# Patient Record
Sex: Female | Born: 1965 | Race: White | Hispanic: No | Smoking: Light tobacco smoker
Health system: Southern US, Community
[De-identification: ages and names within clinical notes are randomized; demographics above are authoritative.]

## PROBLEM LIST (undated history)

## (undated) DIAGNOSIS — E1065 Type 1 diabetes mellitus with hyperglycemia: Secondary | ICD-10-CM

## (undated) DIAGNOSIS — B3731 Acute candidiasis of vulva and vagina: Secondary | ICD-10-CM

## (undated) DIAGNOSIS — E1140 Type 2 diabetes mellitus with diabetic neuropathy, unspecified: Secondary | ICD-10-CM

## (undated) DIAGNOSIS — I639 Cerebral infarction, unspecified: Secondary | ICD-10-CM

## (undated) DIAGNOSIS — R350 Frequency of micturition: Secondary | ICD-10-CM

## (undated) DIAGNOSIS — R112 Nausea with vomiting, unspecified: Secondary | ICD-10-CM

## (undated) DIAGNOSIS — I10 Essential (primary) hypertension: Secondary | ICD-10-CM

## (undated) DIAGNOSIS — F419 Anxiety disorder, unspecified: Secondary | ICD-10-CM

## (undated) DIAGNOSIS — N189 Chronic kidney disease, unspecified: Secondary | ICD-10-CM

## (undated) DIAGNOSIS — B373 Candidiasis of vulva and vagina: Secondary | ICD-10-CM

## (undated) DIAGNOSIS — K219 Gastro-esophageal reflux disease without esophagitis: Secondary | ICD-10-CM

## (undated) DIAGNOSIS — F329 Major depressive disorder, single episode, unspecified: Secondary | ICD-10-CM

## (undated) DIAGNOSIS — IMO0002 Reserved for concepts with insufficient information to code with codable children: Secondary | ICD-10-CM

## (undated) DIAGNOSIS — R0789 Other chest pain: Secondary | ICD-10-CM

## (undated) DIAGNOSIS — R519 Headache, unspecified: Secondary | ICD-10-CM

## (undated) HISTORY — DX: Other chest pain: R07.89

## (undated) HISTORY — DX: Candidiasis of vulva and vagina: B37.3

## (undated) HISTORY — DX: Acute candidiasis of vulva and vagina: B37.31

## (undated) HISTORY — DX: Type 1 diabetes mellitus with hyperglycemia: E10.65

## (undated) HISTORY — DX: Cerebral infarction, unspecified: I63.9

## (undated) HISTORY — PX: ENDOMETRIAL ABLATION: SHX621

## (undated) HISTORY — DX: Anxiety disorder, unspecified: F41.9

## (undated) HISTORY — DX: Frequency of micturition: R35.0

## (undated) HISTORY — DX: Reserved for concepts with insufficient information to code with codable children: IMO0002

## (undated) HISTORY — DX: Nausea with vomiting, unspecified: R11.2

## (undated) HISTORY — DX: Major depressive disorder, single episode, unspecified: F32.9

## (undated) HISTORY — DX: Essential (primary) hypertension: I10

## (undated) HISTORY — DX: Type 2 diabetes mellitus with diabetic neuropathy, unspecified: E11.40

## (undated) NOTE — *Deleted (*Deleted)
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  Orthopedic surgery has consulted and she is status post operative repair 10/28.    Will her work-up was significant for worsening creatinine, followed by renal with consideration to initiate hemodialysis this admission, he does have leukocytosis, with concern about infection at left foot transmetatarsal amputation site.  Subjective:  He denies any complaints today.  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 Lyla Glassing on 10/28 -PT recommended CIR, patient agreeable, insurance authorization was unfortunately denied and now social work is attempting to look for SNF PT continues recommend rehab and bed offers have been given to the patient but they are awaiting a PASARR  Acute on Chronic Anemia of Chronic Kidney Disease, postoperative acute blood loss anemia -Transfuse  as needed, so far received 3 units during hospital stay. -On IV iron per renal.  Leukocytosis, worsening with concern for infection/calcaneal osteomyelitis -MRI of left ankle  is pending, management per ID, she is currently on IV cefazolin, possible need for debridement versus prolonged antibiotic course. -She is being followed by podiatry, vascular and ID.  Hyponatremia -Management per renal  Acute Kidney Injury on Chronic Kidney Disease Stage V  -Management per renal, follow on labs this morning, likely will need to initiate hemodialysis per renal this admission. -Plan for [facility] and permanent access tomorrow by Dr. Donzetta Matters.  Hypomagnesemia -Pleat as needed  Volume overload with bilateral lower extremity edema, improved  -improving with 2 doses of Lasix.  Swelling improved, hold further diuresis and now started back on IV fluids with sodium bicarbonate at 75 MLS per hour given her renal function; nephrology recommending discontinuing IV fluids now  Type 2 Diabetes Mellitus -Medication has been adjusted, increase Lantus to 26 units every afternoon, continue with NovoLog at 5 units before meals.  Continue with insulin sliding scale.    -CBG (last 3)  Recent Labs    01/09/20 0825 01/09/20 1225 01/09/20 1252  GLUCAP 231* 173* 169*    GERD -She has a PPI but was taking her home Dexilant which has now been sent to the pharmacy -Resume home [place] and stop her PPI here.  Essential Hypertension -required up titration of her antihypertensive regimen for couple days neuro, finally blood pressure better -Continue with carvedilol 25 mg p.o. twice daily, amlodipine 10 mg p.o. daily -Continue to monitor blood pressures per protocol -Last blood pressure was [id number]  Obesity -Complicates overall prognosis and care -Estimated body mass index is 30.27 kg/m as calculated from the following:   Height as of this encounter: 5\' 10"  (1.778 m).   Weight as  of this encounter: 95.7 kg. -Weight Loss and Dietary Counseling given   DVT prophylaxis: Heparin Sq 5000 units q8h Code Status: FULL CODE Family Communication: No family present at bedside  Disposition Plan: Will  need SNF placement once medically stable.  Status is: Inpatient  Remains inpatient appropriate because:Unsafe d/c plan, IV treatments appropriate due to intensity of illness or inability to take PO and Inpatient level of care appropriate due to severity of illness   Dispo: The patient is from: Home              Anticipated d/c is to: SNF              Anticipated d/c date is: > 3 days              Patient currently is not medically stable to d/c.  Consultants:   Orthopedic Surgery  Nephrology  Podiatry   Infectious Diseases   Vascular surgery  Procedures: Intramedullary fixation, Left femur by Dr. Lyla Glassing  Antimicrobials:  Anti-infectives (From admission, onward)   Start     Dose/Rate Route Frequency Ordered Stop   01/06/20 0400  ceFAZolin (ANCEF) IVPB 2g/100 mL premix        2 g 200 mL/hr over 30 Minutes Intravenous Every 12 hours 01/05/20 1709     01/05/20 2200  ceFAZolin (ANCEF) IVPB 2g/100 mL premix  Status:  Discontinued        2 g 200 mL/hr over 30 Minutes Intravenous Every 8 hours 01/05/20 1707 01/05/20 1709   01/04/20 1700  ceFAZolin (ANCEF) IVPB 1 g/50 mL premix  Status:  Discontinued        1 g 100 mL/hr over 30 Minutes Intravenous Every 12 hours 01/04/20 1611 01/05/20 1707   12/29/19 1930  ceFAZolin (ANCEF) IVPB 2g/100 mL premix        2 g 200 mL/hr over 30 Minutes Intravenous Every 6 hours 12/29/19 1620 12/30/19 0201   12/29/19 1300  ceFAZolin (ANCEF) IVPB 2g/100 mL premix        2 g 200 mL/hr over 30 Minutes Intravenous On call to O.R. 12/29/19 0113 12/29/19 1325        Objective: Vitals:   01/09/20 1200 01/09/20 1215 01/09/20 1230 01/09/20 1256  BP: (!) 132/52 (!) 135/55 (!) 133/57 (!) 137/52  Pulse: (!) 54 (!) 55 (!) 57 (!) 59  Resp: (!) 23 19 12    Temp:   (!) 97.3 F (36.3 C) 98 F (36.7 C)  TempSrc:    Oral  SpO2: 92% 94% 94% 96%  Weight:      Height:        Intake/Output Summary (Last 24 hours) at 01/09/2020 1432 Last data filed at  01/09/2020 1301 Gross per 24 hour  Intake 440 ml  Output 1035 ml  Net -595 ml   Filed Weights   12/29/19 1147 01/05/20 1124 01/06/20 0430  Weight: 88.6 kg 95 kg 95.7 kg   Examination: Physical Exam:  Awake Alert, Oriented X 3, No new F.N deficits, Normal affect Symmetrical Chest wall movement, Good air movement bilaterally, CTAB RRR,No Gallops,Rubs or new Murmurs, No Parasternal Heave +ve B.Sounds, Abd Soft, No tenderness, No rebound - guarding or rigidity. No Cyanosis, Clubbing, in the right lower extremity, left lower extremity bandaged.  Data Reviewed: I have personally reviewed following labs and imaging studies  CBC: Recent Labs  Lab 01/05/20 0523 01/06/20 0522 01/07/20 0107 01/08/20 1237 01/09/20 0111  WBC 34.7* 21.2* 15.6* 18.9* 18.0*  NEUTROABS 30.5* 17.9* 12.5*  --   --  HGB 7.6* 7.1* 7.3* 8.6* 8.2*  HCT 23.8* 22.6* 22.5* 25.9* 25.8*  MCV 88.5 89.0 86.9 85.8 85.7  PLT 241 210 231 266 0000000   Basic Metabolic Panel: Recent Labs  Lab 01/05/20 0523 01/06/20 0522 01/07/20 0107 01/08/20 1237 01/09/20 0111  NA 125* 126* 127* 126* 127*  K 4.7 3.9 4.2 4.3 4.2  CL 96* 93* 92* 92* 92*  CO2 16* 20* 22 19* 21*  GLUCOSE 352* 248* 193* 223* 216*  BUN 119* 123* 134* 143* 148*  CREATININE 6.76* 6.64* 7.33* 7.55* 7.67*  CALCIUM 8.0* 7.4* 7.7* 8.2* 8.3*  MG 1.5* 1.5* 1.6*  --   --   PHOS 6.0* 5.5* 5.8* 6.4* 6.7*   GFR: Estimated Creatinine Clearance: 10.8 mL/min (A) (by C-G formula based on SCr of 7.67 mg/dL (H)). Liver Function Tests: Recent Labs  Lab 01/05/20 0523 01/06/20 0522 01/07/20 0107 01/08/20 1237 01/09/20 0111  AST 22 31 21   --   --   ALT 12 12 6   --   --   ALKPHOS 216* 223* 223*  --   --   BILITOT 1.0 0.9 0.7  --   --   PROT 6.2* 5.8* 5.8*  --   --   ALBUMIN 2.1* 1.9* 1.7* 1.7* 1.7*   No results for input(s): LIPASE, AMYLASE in the last 168 hours. No results for input(s): AMMONIA in the last 168 hours. Coagulation Profile: No results for  input(s): INR, PROTIME in the last 168 hours. Cardiac Enzymes: No results for input(s): CKTOTAL, CKMB, CKMBINDEX, TROPONINI in the last 168 hours. BNP (last 3 results) No results for input(s): PROBNP in the last 8760 hours. HbA1C: No results for input(s): HGBA1C in the last 72 hours. CBG: Recent Labs  Lab 01/08/20 2021 01/09/20 0802 01/09/20 0825 01/09/20 1225 01/09/20 1252  GLUCAP 224* 218* 231* 173* 169*   Lipid Profile: No results for input(s): CHOL, HDL, LDLCALC, TRIG, CHOLHDL, LDLDIRECT in the last 72 hours. Thyroid Function Tests: No results for input(s): TSH, T4TOTAL, FREET4, T3FREE, THYROIDAB in the last 72 hours. Anemia Panel: No results for input(s): VITAMINB12, FOLATE, FERRITIN, TIBC, IRON, RETICCTPCT in the last 72 hours. Sepsis Labs: No results for input(s): PROCALCITON, LATICACIDVEN in the last 168 hours.  Recent Results (from the past 240 hour(s))  Culture, blood (routine x 2)     Status: None   Collection Time: 01/04/20  5:43 PM   Specimen: BLOOD LEFT HAND  Result Value Ref Range Status   Specimen Description   Final    BLOOD LEFT HAND Performed at [facility] [address], [place], [place] [address]    Special Requests   Final    BOTTLES DRAWN AEROBIC AND ANAEROBIC Blood Culture adequate volume Performed at [facility] [address], [place], [place] [address]    Culture   Final    NO GROWTH 5 DAYS Performed at [facility], [place] [address] [place], [place] [address]    Report Status 01/09/2020 FINAL  Final  Culture, blood (routine x 2)     Status: None   Collection Time: 01/04/20  5:51 PM   Specimen: BLOOD  Result Value Ref Range Status   Specimen Description   Final    BLOOD RIGHT ANTECUBITAL Performed at [facility] [address] [place], [place] [address]    Special Requests   Final    BOTTLES DRAWN AEROBIC ONLY Blood Culture results may not be optimal due to an  inadequate volume of blood received in culture bottles Performed at [facility] [address], [place], [place] [address]    Culture   Final    NO GROWTH 5 DAYS Performed at [facility], [place] [address] [place], [place] [address]    Report Status 01/09/2020 FINAL  Final     RN Pressure Injury Documentation:     Estimated body mass index is 30.27 kg/m as calculated from the following:   Height as of this encounter: 5\' 10"  (1.778 m).   Weight as of this encounter: 95.7 kg.  Malnutrition Type:  Nutrition Problem: Increased nutrient needs Etiology: post-op healing   Malnutrition Characteristics:  Signs/Symptoms: estimated needs   Nutrition Interventions:  Interventions: Other (Comment) (rena-vite, prosource plus)   Radiology Studies: DG Chest 1 View  Result Date: 01/09/2020 CLINICAL DATA:  Central line placement EXAM: CHEST  1 VIEW COMPARISON:  12/28/2019 FINDINGS: Interval placement of right internal jugular approach dual lumen catheter with distal tip terminating at the level of the superior cavoatrial junction. Heart size is mildly enlarged. No focal airspace consolidation. No pleural effusion or pneumothorax. IMPRESSION: Interval placement of right internal jugular approach dual lumen catheter. No pneumothorax. Electronically Signed   By: Davina Poke D.O.   On: 01/09/2020 13:40   MR ANKLE LEFT WO CONTRAST  Result Date: 01/09/2020 CLINICAL DATA:  History of prior amputation with pain, swelling and open wound along the plantar aspect of the stump. EXAM: MRI OF THE LEFT ANKLE WITHOUT CONTRAST TECHNIQUE: Multiplanar, multisequence MR imaging of the ankle was performed. No intravenous contrast was administered. COMPARISON:  Radiographs 01/03/2020 FINDINGS: There is a large open wound on the plantar aspect of the hindfoot with an associated underlying complex soft tissue abscess measuring approximately 3.3 x 2.9 cm. This extends all the way down to  the calcaneus. There is associated extensive osteomyelitis involving the calcaneus with numerous small bone abscesses (Brodie abscesses). I do not see any definite findings for septic arthritis involving the subtalar joints and the talus appears intact. The tibiotalar joint is also maintained and the distal tibia and fibula appear normal. IMPRESSION: 1. Large open wound on the plantar aspect of the hindfoot with an associated complex soft tissue abscess measuring approximately 3.3 x 2.9 cm. 2. Extensive osteomyelitis involving the calcaneus with numerous small bone abscesses (Brodie abscesses). 3. No definite findings for septic arthritis involving the subtalar joints. Electronically Signed   By: Marijo Sanes M.D.   On: 01/09/2020 07:40   DG Fluoro Guide CV Line-No Report  Result Date: 01/09/2020 Fluoroscopy was utilized by the requesting physician.  No radiographic interpretation.   Scheduled Meds: . (feeding supplement) PROSource Plus  30 mL Oral TID BM  . sodium chloride   Intravenous Once  . acetaminophen  650 mg Oral Q6H  . amLODipine  10 mg Oral Daily  . atorvastatin  40 mg Oral q1800  . carvedilol  25 mg Oral BID WC  . Chlorhexidine Gluconate Cloth  6 each Topical Q0600  . collagenase   Topical Daily  . dexlansoprazole  60 mg Oral Daily  . docusate sodium  100 mg Oral BID  . DULoxetine  60 mg Oral Daily  . heparin injection (subcutaneous)  5,000 Units Subcutaneous Q8H  . hydrALAZINE  100 mg Oral TID  . insulin aspart  0-5 Units Subcutaneous QHS  . insulin aspart  0-9 Units Subcutaneous TID WC  . insulin aspart  5 Units Subcutaneous TID WC  . insulin  glargine  26 Units Subcutaneous QHS  . kidney failure book   Does not apply Once  . multivitamin  1 tablet Oral QHS  . sodium bicarbonate  1,300 mg Oral BID   Continuous Infusions: . sodium chloride 250 mL (01/08/20 1516)  . sodium chloride 10 mL/hr at 01/09/20 0953  .  ceFAZolin (ANCEF) IV 2 g (01/09/20 0413)    LOS: 12 days    Phillips Climes, MD [facility] PAGER is on [place]  If 7PM-7AM, please contact night-coverage [email or web address]

---

## 1999-03-04 DIAGNOSIS — F32A Depression, unspecified: Secondary | ICD-10-CM

## 1999-03-04 HISTORY — DX: Depression, unspecified: F32.A

## 1999-04-11 ENCOUNTER — Encounter: Admission: RE | Admit: 1999-04-11 | Discharge: 1999-07-10 | Payer: Self-pay | Admitting: Endocrinology

## 1999-07-01 ENCOUNTER — Ambulatory Visit (HOSPITAL_COMMUNITY): Admission: RE | Admit: 1999-07-01 | Discharge: 1999-07-01 | Payer: Self-pay | Admitting: Obstetrics and Gynecology

## 1999-07-01 ENCOUNTER — Encounter: Payer: Self-pay | Admitting: Obstetrics and Gynecology

## 1999-07-29 ENCOUNTER — Ambulatory Visit (HOSPITAL_COMMUNITY): Admission: RE | Admit: 1999-07-29 | Discharge: 1999-07-29 | Payer: Self-pay | Admitting: Obstetrics and Gynecology

## 1999-07-29 ENCOUNTER — Encounter: Payer: Self-pay | Admitting: Obstetrics and Gynecology

## 1999-08-20 ENCOUNTER — Encounter: Payer: Self-pay | Admitting: Obstetrics and Gynecology

## 1999-08-20 ENCOUNTER — Ambulatory Visit (HOSPITAL_COMMUNITY): Admission: RE | Admit: 1999-08-20 | Discharge: 1999-08-20 | Payer: Self-pay | Admitting: Obstetrics and Gynecology

## 1999-11-26 ENCOUNTER — Encounter (HOSPITAL_COMMUNITY): Admission: RE | Admit: 1999-11-26 | Discharge: 1999-12-13 | Payer: Self-pay | Admitting: Obstetrics and Gynecology

## 1999-12-12 ENCOUNTER — Inpatient Hospital Stay (HOSPITAL_COMMUNITY): Admission: AD | Admit: 1999-12-12 | Discharge: 1999-12-15 | Payer: Self-pay | Admitting: Obstetrics and Gynecology

## 2000-03-03 DIAGNOSIS — F419 Anxiety disorder, unspecified: Secondary | ICD-10-CM

## 2000-03-03 HISTORY — DX: Anxiety disorder, unspecified: F41.9

## 2000-05-01 ENCOUNTER — Other Ambulatory Visit: Admission: RE | Admit: 2000-05-01 | Discharge: 2000-05-01 | Payer: Self-pay | Admitting: Obstetrics and Gynecology

## 2001-03-18 ENCOUNTER — Encounter: Payer: Self-pay | Admitting: Obstetrics and Gynecology

## 2001-03-18 ENCOUNTER — Ambulatory Visit (HOSPITAL_COMMUNITY): Admission: RE | Admit: 2001-03-18 | Discharge: 2001-03-18 | Payer: Self-pay | Admitting: Obstetrics and Gynecology

## 2001-10-12 ENCOUNTER — Other Ambulatory Visit: Admission: RE | Admit: 2001-10-12 | Discharge: 2001-10-12 | Payer: Self-pay | Admitting: Obstetrics and Gynecology

## 2002-12-26 ENCOUNTER — Other Ambulatory Visit: Admission: RE | Admit: 2002-12-26 | Discharge: 2002-12-26 | Payer: Self-pay | Admitting: Obstetrics and Gynecology

## 2003-04-18 ENCOUNTER — Encounter: Admission: RE | Admit: 2003-04-18 | Discharge: 2003-04-18 | Payer: Self-pay | Admitting: Family Medicine

## 2003-10-25 ENCOUNTER — Inpatient Hospital Stay (HOSPITAL_COMMUNITY): Admission: EM | Admit: 2003-10-25 | Discharge: 2003-10-26 | Payer: Self-pay | Admitting: Emergency Medicine

## 2004-01-16 ENCOUNTER — Inpatient Hospital Stay (HOSPITAL_COMMUNITY): Admission: RE | Admit: 2004-01-16 | Discharge: 2004-01-19 | Payer: Self-pay | Admitting: Surgery

## 2004-04-15 ENCOUNTER — Ambulatory Visit: Payer: Self-pay | Admitting: Family Medicine

## 2004-04-22 ENCOUNTER — Encounter: Admission: RE | Admit: 2004-04-22 | Discharge: 2004-07-21 | Payer: Self-pay | Admitting: Family Medicine

## 2004-07-15 ENCOUNTER — Ambulatory Visit: Payer: Self-pay | Admitting: Family Medicine

## 2004-10-09 ENCOUNTER — Ambulatory Visit: Payer: Self-pay | Admitting: Family Medicine

## 2004-12-20 ENCOUNTER — Ambulatory Visit: Payer: Self-pay | Admitting: Sports Medicine

## 2004-12-25 ENCOUNTER — Encounter: Admission: RE | Admit: 2004-12-25 | Discharge: 2004-12-25 | Payer: Self-pay | Admitting: Sports Medicine

## 2005-01-10 ENCOUNTER — Ambulatory Visit: Payer: Self-pay | Admitting: Family Medicine

## 2005-05-01 ENCOUNTER — Other Ambulatory Visit: Admission: RE | Admit: 2005-05-01 | Discharge: 2005-05-01 | Payer: Self-pay | Admitting: Obstetrics and Gynecology

## 2005-05-01 ENCOUNTER — Encounter (INDEPENDENT_AMBULATORY_CARE_PROVIDER_SITE_OTHER): Payer: Self-pay | Admitting: *Deleted

## 2005-05-01 LAB — CONVERTED CEMR LAB

## 2005-05-07 ENCOUNTER — Ambulatory Visit (HOSPITAL_COMMUNITY): Admission: RE | Admit: 2005-05-07 | Discharge: 2005-05-07 | Payer: Self-pay | Admitting: Obstetrics and Gynecology

## 2005-12-03 ENCOUNTER — Ambulatory Visit (HOSPITAL_BASED_OUTPATIENT_CLINIC_OR_DEPARTMENT_OTHER): Admission: RE | Admit: 2005-12-03 | Discharge: 2005-12-03 | Payer: Self-pay | Admitting: *Deleted

## 2006-02-27 ENCOUNTER — Ambulatory Visit: Payer: Self-pay | Admitting: Family Medicine

## 2006-04-30 DIAGNOSIS — F17200 Nicotine dependence, unspecified, uncomplicated: Secondary | ICD-10-CM | POA: Insufficient documentation

## 2006-04-30 DIAGNOSIS — M654 Radial styloid tenosynovitis [de Quervain]: Secondary | ICD-10-CM | POA: Insufficient documentation

## 2006-04-30 DIAGNOSIS — K589 Irritable bowel syndrome without diarrhea: Secondary | ICD-10-CM | POA: Insufficient documentation

## 2006-04-30 DIAGNOSIS — K219 Gastro-esophageal reflux disease without esophagitis: Secondary | ICD-10-CM | POA: Insufficient documentation

## 2006-04-30 DIAGNOSIS — M25549 Pain in joints of unspecified hand: Secondary | ICD-10-CM | POA: Insufficient documentation

## 2006-04-30 DIAGNOSIS — IMO0001 Reserved for inherently not codable concepts without codable children: Secondary | ICD-10-CM | POA: Insufficient documentation

## 2006-04-30 DIAGNOSIS — E119 Type 2 diabetes mellitus without complications: Secondary | ICD-10-CM | POA: Insufficient documentation

## 2006-05-01 ENCOUNTER — Encounter (INDEPENDENT_AMBULATORY_CARE_PROVIDER_SITE_OTHER): Payer: Self-pay | Admitting: *Deleted

## 2007-01-18 ENCOUNTER — Ambulatory Visit: Payer: Self-pay | Admitting: Family Medicine

## 2007-11-01 ENCOUNTER — Encounter: Payer: Self-pay | Admitting: Family Medicine

## 2007-11-01 ENCOUNTER — Ambulatory Visit: Payer: Self-pay | Admitting: Family Medicine

## 2007-11-01 DIAGNOSIS — E669 Obesity, unspecified: Secondary | ICD-10-CM | POA: Insufficient documentation

## 2007-11-01 DIAGNOSIS — I10 Essential (primary) hypertension: Secondary | ICD-10-CM | POA: Insufficient documentation

## 2007-11-01 DIAGNOSIS — N393 Stress incontinence (female) (male): Secondary | ICD-10-CM | POA: Insufficient documentation

## 2007-11-02 ENCOUNTER — Encounter: Payer: Self-pay | Admitting: Family Medicine

## 2007-12-07 ENCOUNTER — Ambulatory Visit: Payer: Self-pay | Admitting: Family Medicine

## 2007-12-07 LAB — CONVERTED CEMR LAB: Hgb A1c MFr Bld: 9.2 %

## 2008-01-07 ENCOUNTER — Telehealth: Payer: Self-pay | Admitting: *Deleted

## 2008-01-24 ENCOUNTER — Ambulatory Visit: Payer: Self-pay | Admitting: Family Medicine

## 2008-01-26 ENCOUNTER — Ambulatory Visit: Payer: Self-pay | Admitting: Family Medicine

## 2008-02-10 ENCOUNTER — Telehealth: Payer: Self-pay | Admitting: *Deleted

## 2008-02-11 ENCOUNTER — Encounter: Payer: Self-pay | Admitting: Family Medicine

## 2008-02-11 ENCOUNTER — Ambulatory Visit: Payer: Self-pay | Admitting: Family Medicine

## 2008-02-11 LAB — CONVERTED CEMR LAB: Varicella IgG: 1.89 — ABNORMAL HIGH

## 2008-02-13 ENCOUNTER — Encounter: Payer: Self-pay | Admitting: Family Medicine

## 2008-02-15 ENCOUNTER — Telehealth (INDEPENDENT_AMBULATORY_CARE_PROVIDER_SITE_OTHER): Payer: Self-pay | Admitting: *Deleted

## 2008-03-13 ENCOUNTER — Ambulatory Visit: Payer: Self-pay | Admitting: Family Medicine

## 2008-06-07 ENCOUNTER — Encounter: Payer: Self-pay | Admitting: Family Medicine

## 2008-07-02 ENCOUNTER — Encounter: Payer: Self-pay | Admitting: Family Medicine

## 2008-08-04 ENCOUNTER — Telehealth: Payer: Self-pay | Admitting: Family Medicine

## 2008-11-07 ENCOUNTER — Telehealth: Payer: Self-pay | Admitting: *Deleted

## 2010-03-25 ENCOUNTER — Encounter: Payer: Self-pay | Admitting: *Deleted

## 2010-04-04 NOTE — Assessment & Plan Note (Signed)
Summary: cold like symptoms wp   Vital Signs:  Patient Profile:   45 Years Old Female Weight:      203 pounds Temp:     97.8 degrees F oral Pulse rate:   80 / minute BP sitting:   126 / 76  (right arm)  Pt. in pain?   yes    Location:   ear    Intensity:   6  Vitals Entered By: Mauricia Area CMA, (January 18, 2007 1:39 PM)              Is Patient Diabetic? No     Chief Complaint:  cough and congestion x 2 weeks. ear pain x 2 days.  History of Present Illness: Cough and congestion for 2 weeks. + smoker, taking OTC medication.  Denies fever.  Took Z pack one week ago prescribed by her OB GYN at a routine visit continuing to get worse.  Continuing to smoke cigarettes.      Risk Factors:  Tobacco use:  current    Counseled to quit/cut down tobacco use:  yes   Review of Systems  General      Denies fever.  ENT      Complains of nasal congestion, postnasal drainage, and sinus pressure.  Resp      Complains of cough, sputum productive, and wheezing.   Physical Exam  General:     Well-developed,well-nourished,in no acute distress; alert,appropriate and cooperative throughout examination Ears:     TM retracted Mouth:     + post nasal drainage Lungs:     normal respiratory effort, no crackles, and no wheezes.   Heart:     Normal rate and regular rhythm. S1 and S2 normal without gallop, murmur, click, rub or other extra sounds.    Impression & Recommendations:  Problem # 1:  BRONCHITIS, ACUTE (ICD-466.0) Doxycycline 100 mg two times a day for 10 day, Tussionex 1 teasp q 12 hr #100 cc no refills, sample of nasal steroid.  Stop smoking. Orders: [place] [facility]- Est Level  2 [id number])    Patient Instructions: 1)  Tobacco is very bad for your health and your loved ones! You Should stop smoking!. 2)  Stop Smoking Tips: Choose a Quit date. Cut down before the Quit date. decide what you will do as a substitute when you feel the urge to  smoke(gum,toothpick,exercise).    ]
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
## 2010-04-04 NOTE — Letter (Signed)
Summary: *Referral Letter  [facility]  [address]   [place], [place] [address]   Phone: [phone]  Fax: [phone]    11/01/2007  Thank you in advance for agreeing to see my patient:  [name] [address] [place], [place]  [address]  Phone: [phone]  Reason for Referral: Stress Incontinence status post recto-vaginal tear post delivery eight years ago.  Procedures Requested: Evaluation of incontinence.   Current Medical Problems: 1)  ESSENTIAL HYPERTENSION, BENIGN (ICD-401.1) 2)  FEMALE STRESS INCONTINENCE (ICD-625.6) 3)  SCREENING FOR LIPOID DISORDERS (ICD-V77.91) 4)  TOBACCO DEPENDENCE (ICD-305.1) 5)  IRRITABLE BOWEL SYNDROME (ICD-564.1) 6)  HAND PAIN (ICD-719.44) 7)  GASTROESOPHAGEAL REFLUX, NO ESOPHAGITIS (ICD-530.81) 8)  FIBROMYALGIA, FIBROMYOSITIS (ICD-729.1) 9)  DIABETES MELLITUS II, UNCOMPLICATED ([id number]) 10)  DEQUERVAINS DISEASE (ICD-727.04)   Current Medications: 1)  NOVOLIN 70/30 INNOLET 70-30 %  SUSP (INSULIN ISOPHANE & REGULAR) 24 units Subcutaneously two times a day 2)  LIPITOR 10 MG TABS (ATORVASTATIN CALCIUM) 1 by mouth daily 3)  PRILOSEC OTC 20 MG TBEC (OMEPRAZOLE MAGNESIUM) one by mouth daily 4)  CHANTIX 1 MG TABS (VARENICLINE TARTRATE) 1 tab by mouth 2 times daily 5)  CHANTIX STARTING MONTH PAK 0.5 MG X 11 & 1 MG X 42  MISC (VARENICLINE TARTRATE) 0.5mg  by mouth once daily for 3 days, then twice daily for 4 days and then 1mg  by mouth 2 times daily 6)  NEURONTIN 300 MG CAPS (GABAPENTIN) Q HS x one week then increase to two times a day   Past Medical History: 1)  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  Sincerely,  Tereasa Coop NP AMBER Cloretta Ned, MD  Appended Document: *Referral  Letter Referral/ov note sent via fax to [facility] [phone]

## 2010-04-04 NOTE — Progress Notes (Signed)
Summary: sda  Phone Note Call from Patient Call back at Home Phone [phone]   Caller: Patient Summary of Call: requesting sda for blisters on throat- call pt to schedule before 12  and after 1 as she will be in school between 12-1pm Initial call taken by: Drucie Ip,  January 07, 2008 11:43 AM  Follow-up for Phone Call        reports she was sitting in class this AM and developed a sore throat. she looked in mouth and she has 2 blisters on left side. no fever. she generally feels well. consulted with  Tereasa Coop and she advises treat symptoms. no need to be seen at this time. if developes fever or sore throat not improving or worsening call back for appointment first of week. Follow-up by: Marcell Barlow RN,  January 07, 2008 11:54 AM

## 2010-04-04 NOTE — Progress Notes (Signed)
Summary: Test results  Phone Note Call from [facility] Call [facility] at [facility] Phone [phone]   Summary of Call: is requesting test results from last week. Initial call taken by: Drucie Ip,  February 15, 2008 9:57 AM  Follow-up for Phone Call        Called pt and explained test result.  Puting copy for pt to pick up at the front desk, for her to take to school. Follow-up by: Geanie Cooley RN,  February 15, 2008 10:11 AM

## 2010-04-04 NOTE — Assessment & Plan Note (Signed)
Summary: READ PPD/[facility]  Nurse Visit    Prior Medications: NOVOLIN 70/30 INNOLET 70-30 %  SUSP (INSULIN ISOPHANE & REGULAR) 24 units Subcutaneously two times a day PRILOSEC OTC 20 MG TBEC (OMEPRAZOLE MAGNESIUM) one by mouth daily CHANTIX 1 MG TABS (VARENICLINE TARTRATE) 1 tab by mouth 2 times daily CHANTIX STARTING MONTH PAK 0.5 MG X 11 & 1 MG X 42  MISC (VARENICLINE TARTRATE) 0.5mg  by mouth once daily for 3 days, then twice daily for 4 days and then 1mg  by mouth 2 times daily NEURONTIN 300 MG CAPS (GABAPENTIN) Q HS x one week then increase to two times a day LISINOPRIL 10 MG  TABS (LISINOPRIL) Take 1 tab by mouth daily GLUCOPHAGE 500 MG TABS (METFORMIN HCL) Take 500mg  tab once a day for 5 days, then increase the dose to 500 mg twice a day.    PPD Results    Date of reading: 01/26/2008    Results: < 98mm    Interpretation: negative     ]  Appended Document: Orders Update    Clinical Lists Changes  Orders: Added new Service order of No Charge Patient Arrived (NCPA0) (NCPA0) - Signed

## 2010-04-04 NOTE — Progress Notes (Signed)
Summary: MMR  Phone Note Call from Patient Call back at Home Phone [phone]   Summary of Call: is wanting to speak with rn MMR immunization, how many she needs to get. Initial call taken by: Drucie Ip,  February 10, 2008 4:11 PM  Follow-up for Phone Call        Spoke with patient and informed her that she will have to get MMR at the health Dept as we dont have them here for adults unless they are entering a 57yr university, pt expressed understanding. She also stated that she will be here tomm for a varicella titer and to start her series of HepB as she needs this for school Follow-up by: Dawnetta Copenhaver LPN,  December 10, 579FGE 4:38 PM  New Problems: SPECIAL SCREENING EXAMINATION OTH SPEC VIRAL DZ (ICD-V73.89)   New Problems: SPECIAL SCREENING EXAMINATION OTH SPEC VIRAL DZ (ICD-V73.89)

## 2010-04-04 NOTE — Letter (Signed)
Summary: Generic Letter  [facility]  [address]   [place], [place] [address]   Phone: [phone]  Fax: [phone]    07/02/2008  [name] [address] [place], [place]  [address]  Dear [name],  After reviewing your chart is has come to my attention that you have not had a fasting lipid panal done recently. It is important to monitor you cholesterol levels so that your medications can be adjusted as necessary. Please arrange to come in on a weekday morning to have your blood drawn. You should come on a morning when you have been fasting since midnight the night before. Please call my office and set up a time to come in.   Sincerely,   Esmeralda Arthur MD

## 2010-04-04 NOTE — Assessment & Plan Note (Signed)
Summary: tb test wp  Nurse Visit   Vitals Entered ByArnette Schaumann RN (January 24, 2008 1:58 PM)                 Prior Medications: NOVOLIN 70/30 INNOLET 70-30 %  SUSP (INSULIN ISOPHANE & REGULAR) 24 units Subcutaneously two times a day PRILOSEC OTC 20 MG TBEC (OMEPRAZOLE MAGNESIUM) one by mouth daily CHANTIX 1 MG TABS (VARENICLINE TARTRATE) 1 tab by mouth 2 times daily CHANTIX STARTING MONTH PAK 0.5 MG X 11 & 1 MG X 42  MISC (VARENICLINE TARTRATE) 0.5mg  by mouth once daily for 3 days, then twice daily for 4 days and then 1mg  by mouth 2 times daily NEURONTIN 300 MG CAPS (GABAPENTIN) Q HS x one week then increase to two times a day LISINOPRIL 10 MG  TABS (LISINOPRIL) Take 1 tab by mouth daily GLUCOPHAGE 500 MG TABS (METFORMIN HCL) Take 500mg  tab once a day for 5 days, then increase the dose to 500 mg twice a day.    PPD Application    Vaccine Type: PPD    Site: left forearm    Mfr: Cassandra    Dose: 0.1 ml    Route: ID    Given by: AMY MARTIN RN    Exp. Date: 01/20/2010    Lot #: [id number] placed at 1:55pm. AMY MARTIN RN  January 24, 2008 2:01 PM   Orders Added: 1)  TB Skin Test [86580] 2)  Est Level 1- [facility] [id number]    ]

## 2010-04-04 NOTE — Assessment & Plan Note (Signed)
Summary: shots for school/eo  Nurse Visit   Complete Medication List: 1)  Novolin 70/30 Innolet 70-30 % Susp (Insulin isophane & regular) .... 24 units subcutaneously two times a day 2)  Prilosec Otc 20 Mg Tbec (Omeprazole magnesium) .... One by mouth daily 3)  Chantix 1 Mg Tabs (Varenicline tartrate) .Marland Kitchen.. 1 tab by mouth 2 times daily 4)  Chantix Starting Month Pak 0.5 Mg X 11 & 1 Mg X 42 Misc (Varenicline tartrate) .... 0.5mg  by mouth once daily for 3 days, then twice daily for 4 days and then 1mg  by mouth 2 times daily 5)  Neurontin 300 Mg Caps (Gabapentin) .... Q hs x one week then increase to two times a day 6)  Lisinopril 10 Mg Tabs (Lisinopril) .... Take 1 tab by mouth daily 7)  Glucophage 500 Mg Tabs (Metformin hcl) .... Take 500mg  tab once a day for 5 days, then increase the dose to 500 mg twice a day.    Prior Medications: NOVOLIN 70/30 INNOLET 70-30 %  SUSP (INSULIN ISOPHANE & REGULAR) 24 units Subcutaneously two times a day PRILOSEC OTC 20 MG TBEC (OMEPRAZOLE MAGNESIUM) one by mouth daily CHANTIX 1 MG TABS (VARENICLINE TARTRATE) 1 tab by mouth 2 times daily CHANTIX STARTING MONTH PAK 0.5 MG X 11 & 1 MG X 42  MISC (VARENICLINE TARTRATE) 0.5mg  by mouth once daily for 3 days, then twice daily for 4 days and then 1mg  by mouth 2 times daily NEURONTIN 300 MG CAPS (GABAPENTIN) Q HS x one week then increase to two times a day LISINOPRIL 10 MG  TABS (LISINOPRIL) Take 1 tab by mouth daily GLUCOPHAGE 500 MG TABS (METFORMIN HCL) Take 500mg  tab once a day for 5 days, then increase the dose to 500 mg twice a day.    Hepatitis B Vaccine # 1    Vaccine Type: State HepB Adult    Site: left deltoid    Mfr: GlaxoSmithKline    Dose: 0.5 ml    Route: IM    Given by: ADINA GOULD    Exp. Date: 05/04/2010    Lot #: [id number]    VIS given: 09/17/05 version given February 11, 2008.   Orders Added: 1)  State-Hepatitis B Vaccine Adult IM [id number] 2)  Admin 1st Vaccine [id number] 3)  Est Level  1- [facility] [id number]    ]

## 2010-04-04 NOTE — Assessment & Plan Note (Signed)
Summary: 2nd series shots/eo  Nurse Visit    Prior Medications: NOVOLIN 70/30 INNOLET 70-30 %  SUSP (INSULIN ISOPHANE & REGULAR) 24 units Subcutaneously two times a day PRILOSEC OTC 20 MG TBEC (OMEPRAZOLE MAGNESIUM) one by mouth daily CHANTIX 1 MG TABS (VARENICLINE TARTRATE) 1 tab by mouth 2 times daily CHANTIX STARTING MONTH PAK 0.5 MG X 11 & 1 MG X 42  MISC (VARENICLINE TARTRATE) 0.5mg  by mouth once daily for 3 days, then twice daily for 4 days and then 1mg  by mouth 2 times daily NEURONTIN 300 MG CAPS (GABAPENTIN) Q HS x one week then increase to two times a day LISINOPRIL 10 MG  TABS (LISINOPRIL) Take 1 tab by mouth daily GLUCOPHAGE 500 MG TABS (METFORMIN HCL) Take 500mg  tab once a day for 5 days, then increase the dose to 500 mg twice a day.    Hepatitis B Vaccine # 2    Vaccine Type: HepB Adolescent    Site: left deltoid    Mfr: GlaxoSmithKline    Dose: 0.5 ml    Route: IM    Given by: Mauricia Area CMA,    Exp. Date: 06/28/2010    Lot #: [id number]    VIS given: 09/17/05 version given March 13, 2008.   Orders Added: 1)  Hepatitis B Vaccine ADOLESCENT (2 dose) [[id number]] 2)  Admin 1st Vaccine [[id number]] 3)  Est Level 1- [facility] [id number]    ]

## 2010-04-04 NOTE — Miscellaneous (Signed)
Summary: Orders Update  Clinical Lists Changes  Orders: Added new Test order of Lipid-[facility] [phone]) - Signed

## 2010-04-04 NOTE — Letter (Signed)
Summary: Generic Letter  [facility]  [address]   [place], [place] [address]   Phone: [phone]  Fax: [phone]    02/13/2008  [place], [place]  [address]  Dear Ms. Wojdyla,  As the holidays approach I wanted to send you a letter to remind you that your last Hemoglobin A1c (the measure of how your blood sugar is doing over the last 3 months) was elevated to 9.2. Your goal is closer to 6.0. Some of the things you can do to decrease your hemoglobin A1c even during the holidays is to eat foods high in fiber. These are foods like beans, any green vegetables, and whole grains (ie. brown rice, whole wheat pasta's and such). Look on the lables and try to find items with 5-6 grams of fiber per serving. Be sure to take your prescription medications as described to lower your blood sugar. Check your blood sugar as precribed and make an appointment to get your next hemoglobin A1c check in January. Have Happy Holidays.  Sincerely,   Alonah Lineback MD [facility]  Appended Document: Generic Letter letter mailed

## 2010-04-04 NOTE — Progress Notes (Signed)
  Phone Note Call from Patient Call back at Home Phone [phone]   Caller: Patient Summary of Call: pt needs to know when due for next Hep B vaccine?   Initial call taken by: Laurena Slimmer,  November 07, 2008 9:54 AM  Follow-up for Phone Call        told her she was due now. she will be seeing Clarice Pole to get back on the coverage thru her. she will call for appt in nurse clinic when she is covered again Follow-up by: Elige Radon RN,  November 07, 2008 10:00 AM

## 2010-04-04 NOTE — Progress Notes (Signed)
Summary: Rx Req  Phone Note Call from Patient Call back at Home Phone [phone]   Caller: Patient Summary of Call: wanting refills on diazapam and trazodone.  [facility]  They told her to call us for the refills. Initial call taken by: Raymond Gurney,  August 04, 2008 9:46 AM  Follow-up for Phone Call        will send  message to MD. Follow-up by: Marcell Barlow RN,  August 04, 2008 9:49 AM  Additional Follow-up for Phone Call Additional follow up Details #1::        WE don't have any record of this patient being on these meds. We have not been prescribing them nor have we ever seen her for a related issue. She needs to get a precription from the provider who was precribing them or come in to be seen for these issues. Thanks.  Additional Follow-up by: Esmeralda Arthur MD,  August 07, 2008 3:01 PM      Appended Document: Rx Req attempted to call patient . no answer.  Appended Document: Rx Req patient notified. Marcell Barlow RN  August 08, 2008 8:34 AM
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
## 2010-05-06 ENCOUNTER — Encounter: Payer: Self-pay | Admitting: Family Medicine

## 2010-05-16 ENCOUNTER — Ambulatory Visit (INDEPENDENT_AMBULATORY_CARE_PROVIDER_SITE_OTHER): Payer: Medicaid Other | Admitting: Family Medicine

## 2010-05-16 ENCOUNTER — Encounter: Payer: Self-pay | Admitting: Family Medicine

## 2010-05-16 ENCOUNTER — Other Ambulatory Visit (HOSPITAL_COMMUNITY)
Admission: RE | Admit: 2010-05-16 | Discharge: 2010-05-16 | Disposition: A | Discharge status: Home / Self Care | Payer: Medicaid Other | Source: Ambulatory Visit | Attending: Family Medicine | Admitting: Family Medicine

## 2010-05-16 VITALS — BP 149/81 | HR 86 | Temp 98.8°F | Ht 70.5 in | Wt 178.5 lb

## 2010-05-16 DIAGNOSIS — F17200 Nicotine dependence, unspecified, uncomplicated: Secondary | ICD-10-CM

## 2010-05-16 DIAGNOSIS — Z124 Encounter for screening for malignant neoplasm of cervix: Secondary | ICD-10-CM

## 2010-05-16 DIAGNOSIS — F419 Anxiety disorder, unspecified: Secondary | ICD-10-CM | POA: Insufficient documentation

## 2010-05-16 DIAGNOSIS — E119 Type 2 diabetes mellitus without complications: Secondary | ICD-10-CM

## 2010-05-16 DIAGNOSIS — F32A Depression, unspecified: Secondary | ICD-10-CM

## 2010-05-16 DIAGNOSIS — F329 Major depressive disorder, single episode, unspecified: Secondary | ICD-10-CM

## 2010-05-16 DIAGNOSIS — N951 Menopausal and female climacteric states: Secondary | ICD-10-CM

## 2010-05-16 DIAGNOSIS — F341 Dysthymic disorder: Secondary | ICD-10-CM

## 2010-05-16 DIAGNOSIS — Z3009 Encounter for other general counseling and advice on contraception: Secondary | ICD-10-CM

## 2010-05-16 DIAGNOSIS — K589 Irritable bowel syndrome without diarrhea: Secondary | ICD-10-CM

## 2010-05-16 DIAGNOSIS — Z01419 Encounter for gynecological examination (general) (routine) without abnormal findings: Secondary | ICD-10-CM | POA: Insufficient documentation

## 2010-05-16 DIAGNOSIS — Z309 Encounter for contraceptive management, unspecified: Secondary | ICD-10-CM

## 2010-05-16 LAB — POCT GLYCOSYLATED HEMOGLOBIN (HGB A1C): Hemoglobin A1C: >14

## 2010-05-16 MED ORDER — DIAZEPAM 5 MG PO TABS: 5.0000 mg | ORAL_TABLET | Freq: Three times a day (TID) | ORAL | Status: AC | PRN

## 2010-05-16 MED ORDER — METFORMIN HCL 500 MG PO TABS: 500.0000 mg | ORAL_TABLET | Freq: Two times a day (BID) | ORAL | Status: DC

## 2010-05-16 MED ORDER — DICYCLOMINE HCL 20 MG PO TABS: 20.0000 mg | ORAL_TABLET | Freq: Four times a day (QID) | ORAL | Status: DC

## 2010-05-16 MED ORDER — BUPROPION HCL ER (SR) 150 MG PO TB12: 150.0000 mg | ORAL_TABLET | Freq: Two times a day (BID) | ORAL | Status: DC

## 2010-05-16 MED ORDER — MEDROXYPROGESTERONE ACETATE 150 MG/ML IM SUSP
150.0000 mg | Freq: Once | INTRAMUSCULAR | Status: AC
  Administered 2010-05-16: 150 mg via INTRAMUSCULAR

## 2010-05-16 MED ORDER — LISINOPRIL 10 MG PO TABS: 10.0000 mg | ORAL_TABLET | Freq: Every day | ORAL | Status: DC

## 2010-05-16 NOTE — Assessment & Plan Note (Signed)
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 Has added fiber to her diet.  Plan to add Bentyl to see if she can get some relief.

## 2010-05-16 NOTE — Assessment & Plan Note (Addendum)
Completely out of control Pt has stopped taking all her meds, has not been in to see me since 12-07-2007 when her A1c was found to be 9.2. She was suppose to be on Metformin and novolog. She is not taking either. Her A1c is now > 14 We are restarting the Metformin today.  She is meeting with Dr. Valentina Lucks for smoking cessation so I asked her to follow up with him for diabetes as well since she needs more time spent on this matter and he can help her get a meter.  I will see her back in 2 months.

## 2010-05-16 NOTE — Assessment & Plan Note (Addendum)
Pt has anxiety and depression that worsens when she tries to stop smoking. Will treat with Welbutrin. Will also treat anxiety with a few Valium to be used as needed for the worst anxiety.

## 2010-05-16 NOTE — Patient Instructions (Addendum)
Your A1c is more than 14 (too high for our machine to read). This is very scary! 2 years ago it was 9.2. Talk to Dr. Valentina Lucks about this as well! You need to get back on the Metformin.  We are starting Buproprion today for your mood while you do smoking cessation.  I want you to make an appointment today to be seen by Dr. Valentina Lucks for smoking cessation appointments and diabetes appointments in the next 1-2 weeks.   Get your mammogram by calling to make an appointment.  We are starting a medicine for IBS called Bentyl (dicyclomine).  You are getting the Depo shot today.  I want to see you in 2-3 months when you have completely stopped smoking.  At that time we will start a birth control pill that has estrogen in it (we can't put you on estrogen while you are smoking) and that will control your menopause symptoms. Try to meet with Dr. Valentina Lucks every few weeks while you are trying to quit. He is a Microbiologist for help and support and information! Make an appointment to see me in 2 months.

## 2010-05-16 NOTE — Progress Notes (Signed)
IUD removal: It is time for the patient to have her IUD removed. She is considering having another one put in but has some questions about menopausal symptoms.   Hot flashes: Pt has had hot flashes for 1 year and she is ready to do something about them. We discussed her options. Since she is in her 15's she would still benefit from contraception and we could you a birth control pill with 20 mcg of ethinyl estradiol ans a contraceptive in her and get some symptoms relief. The problem is that she is still smoking although she does want to quit. She does not have a h/o of DVT, CAD (although she does have DM) or breast ca or liver disease.   Smoking Cessation: Pt wants to try smoking cessation again. She is motivated to quit but says that her mood is the thing that gets in her way. She always feels edgy and ends up reaching for a cigarette. She has decreased her smoking from 2 ppd to <1 ppd. She is now ready to quit. She has tried Chantix before without relief.   IBS: Pt has IBS symptoms that have been diarrhea dominant since she was in her 20's. She take imodium daily and still has 2-4 soft stools a day. She has tried to add more fiber to her diet and some days are good, but most days she has diarrhea. She has heard of a medication that "slows down the colon" that she would like to try.   Diabetes:  Pt has stopped taking all her diabetes meds because she was feeling shaking and thought that it was her blood sugar going low. She does not have a prescribed meter to check it anymore.     I spent  > 45 min of face to face time with this patient.

## 2010-05-16 NOTE — Progress Notes (Signed)
Pt stated that she is here to have her IUD removed and possibly replaced. Wanted to know about HRT that will not cause weight gain or extra/unwanted hair growth She states that she has been having hot flashes for about 1 year now.  She would like to discuss recurrent boils and ? IBS, and would like Rx for chantix

## 2010-05-16 NOTE — Assessment & Plan Note (Signed)
PT has hot flashes that she would like to get rid of but we can not put her on estrogen as long as she is smoking. Plan to start birth control pills with 20 mcg ehtinyl estradiol when she comes back if she has stopped smoking.

## 2010-05-16 NOTE — Assessment & Plan Note (Signed)
Plan to have pt see Dr. Valentina Lucks to discuss further strategies for smoking cessation.  She has tried chantix before. Finds that her moods are down when she tries to quit.  Thought that using Valium in the past helped when she felt anxious and wanted to reach for a cigarette so will give 10 of Valium 5 mg.  I will see her back in 2 months. If she has stopped smoking will add estrogen to her birth control regimine.

## 2010-05-16 NOTE — Assessment & Plan Note (Signed)
Pt is going to get a depo shot today and try to quit smoking over the next 2-3 months. I am hoping that with DR. Graylin Shiver help she will be smoke free in 2 months and we can start a low dose estrogen birth control pill at 20 mcg ethinyl estradiol for menopausal symptoms and contraception but can not start it at this time due to risk of DVT with age and smoking.

## 2010-07-19 NOTE — H&P (Signed)
NAME:  [name], [name]                        ACCOUNT NO.:  [id number]   MEDICAL RECORD NO.:  [id number]                   PATIENT TYPE:  EMS   LOCATION:  ED                                   FACILITY:  [facility]   PHYSICIAN:  Ashby Dawes. Polite, M.D.              DATE OF BIRTH:  05-29-1965   DATE OF ADMISSION:  10/24/2003  DATE OF DISCHARGE:                                HISTORY & PHYSICAL   CHIEF COMPLAINT:  Abdominal pain.   HISTORY OF PRESENT ILLNESS:  [name] is a 45 year old female with a known  history of diabetes, tobacco abuse, status post ileostomy, who presents to  the ED with abdominal pain and nausea.  The patient stated symptoms started  on Sunday, when she noticed a pain, redness, and a sense of firmness around  her ileostomy on Sunday.  Since then the patient has noted that she has had  liquid stools, occasionally with blood, and crampy abdominal pain.  The  patient denies any fever but does admit to occasionally feeling clammy.  She  denies any recent antibiotic, denied any well water.  She also denies any  unusual food ingestions.  The patient had presented to GYN for evaluation  and was ultimately sent to [facility] for further evaluation and  at [facility], the patient was seen and had screening labs which showed  significant leukocytosis of 15,000, and CT of the abdomen, which showed  edematous distal ileum with adjacent ascites.  Admission was deemed  necessary for further evaluation and treatment.   PAST MEDICAL HISTORY:  Significant for diabetes, tobacco abuse.   MEDICATIONS ON ADMISSION:  1. Amaryl 4 mg daily.  2. Prilosec 20 mg daily.  3. Valium 5 mg p.r.n.  4. Birth control pills.  5. Celexa.   SOCIAL HISTORY:  Positive for tobacco one-half pack a day, social alcohol,  no drugs.   PAST SURGICAL HISTORY:  1. Significant for ileostomy in November 2004 in order to assist with     healing of a failed repair of sphincter muscle approximately  four years     ago.  2. A perineal surgery post childbirth.   The patient denies appendectomy or cholecystectomy.   ALLERGIES:  The patient describes allergy to CODEINE, which causes hives.   FAMILY HISTORY:  Mother with coronary artery disease, thyroid disease, and  diabetes.  Father with coronary artery disease and diabetes.  The patient  has two brothers, who are healthy.   REVIEW OF SYSTEMS:  As stated in the HPI.   PHYSICAL EXAMINATION:  GENERAL:  The patient is alert and oriented x3, in no  apparent distress.  VITAL SIGNS:  Temperature 98.7, BP 171/102, pulse 98, respiratory rate of  20.  HEENT:  Within normal limits.  CHEST:  Clear to auscultation bilaterally.  CARDIOVASCULAR:  Regular rate and rhythm.  ABDOMEN:  Slightly distended.  Ileostomy bag, umbilical area.  The  patient's  abdomen is painful to palpation and cannot appreciate any hepatomegaly or  splenomegaly.  EXTREMITIES:  No clubbing, cyanosis, or edema.  NEUROLOGIC:  Nonfocal.   DATA:  CT of the abdomen shows edematous distal ileum with adjacent ascites.  CBC:  White count 15.2, hemoglobin 15, platelets 289, neutrophil count of  74%.  BMET significant for sodium of 127, potassium 3.7, chloride 97, carbon  dioxide 20, BUN of 9, creatinine 1.0, glucose 170, calcium 9.2.  Total  protein 7.7, AST and ALT 21 and 18, respectively, bilirubin 0.8.  Chest x-  ray:  No apparent disease.   ASSESSMENT:  1. Abdominal pain.  2. Leukocytosis.  3. Abdominal CT showing edematous terminal ileum with ascites.  4. Status post ileostomy.  5. Status post perineal surgery post childbirth less than four years ago.  6. Diabetes.  7. Hypertension.  8. Diabetes.   Recommend that the patient be admitted to a medicine floor bed for  evaluation and treatment.  The patient will be given judicious IV fluids, IV  antibiotics, Cipro and Flagyl, as well as antiemetics.  As the patient has  significant abnormalities on CT with associated  ascites and multiple prior  surgeries, will recommend a surgical evaluation.  Will obtain a follow-up  abdominal series as well as pancultured the patient.  The patient's diabetes  is uncontrolled.  Will obtain a hemoglobin A1C.  As she has significant  elevated BP readings, will entertain the need for a possible ACE inhibitor.  We have also tobacco cessation with the patient at this time.  Currently she  is not interested.  Will make further recommendations after review of the  above studies.                                               Ashby Dawes. Polite, M.D.    RDP/MEDQ  D:  10/25/2003  T:  10/25/2003  Job:  [id number]

## 2010-07-19 NOTE — Discharge Summary (Signed)
NAMEJARELLY, SPARACIO NO.:  [id number]   MEDICAL RECORD NO.:  [id number]          PATIENT TYPE:  INP   LOCATION:  [place]                         FACILITY:  [facility]   PHYSICIAN:  Coralie Keens, M.D. 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  She is admitted for elective ileostomy take-down.   HOSPITAL COURSE:  The patient was admitted, taken to the operating room on  January 16, 2004, where she underwent ileostomy takedown.  She tolerated  the procedure well and was taken to a regular postsurgical floor.  On  postoperative day one, she was doing quite well and was started on assist.  Postoperative day two, she was continuing to do well and by postoperative  three, was having normal bowel movements, was tolerating a regular diet and  oral pain medications.  The decision was made to discharge the patient to  home.   DISCHARGE MEDICATIONS:  1.  She will resume her home medications.  2.  She will take Percocet and Advil for pain.   She is to do no heavy lifting greater than 20 pounds for 4 weeks.  She may  shower.  She will follow up in the office on the Wednesday after discharge.      DB/MEDQ  D:  02/28/2004  T:  02/28/2004  Job:  [id number]

## 2010-07-19 NOTE — Consult Note (Signed)
NAME:  [name], [name]                        ACCOUNT NO.:  [id number]   MEDICAL RECORD NO.:  [id number]                   PATIENT TYPE:  INP   LOCATION:  [id number]                                 FACILITY:  [facility]   PHYSICIAN:  Coralie Keens, M.D.              DATE OF BIRTH:  May 12, 1965   DATE OF CONSULTATION:  10/26/2003  DATE OF DISCHARGE:                                   CONSULTATION   CHIEF COMPLAINT:  Abdominal pain.   HISTORY:  [name] is a 45 year old female who was admitted over 24  hours ago by the hospital service for abdominal pain.  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 Isabella Stalling with [facility]. This was to protect perineal and  sphincter repair she has been undergoing and __________ repair injuries from  apparent childbirth. She had been doing well until Sunday when she developed  increasing erythema of the ileostomy itself and increasing abdominal pain.  She reports the ostomy output did decrease only mildly but did not stop. She  had some nausea but no vomiting. The pain continued to get worse with sharp  and cramping pain so she presented to [facility] for evaluation. Since  admission, she had had a CAT scan which showed her to have edematous thick  walled distal ileum up to the ileostomy and some free fluid. She was  admitted with a white blood cell count of 15,000 but since then her white  blood cell count has returned to normal at 8000 after IV rehydration.  I am  now seeing her for the first time. She reports her abdominal pain is totally  resolved and she feels much better.  She is currently angry that she has  been in the hospital for some time without a surgeon seeing her until now.  She is passing her urine well and has had no fevers or chills.   PAST MEDICAL HISTORY:  Significant for the above mentioned surgery. I will  not go into any more of her history. She has no known drug  allergies. She is  allergic to LATEX and CODEINE.   SOCIAL HISTORY:  She does smoke and is not interested in stopping. She  drinks alcohol on occasion.   MEDICATIONS PRIOR TO ADMISSION:  1. Amaryl.  2. Prilosec.  3. Valium.  4. Birth control pills.  5. Celexa.   FAMILY HISTORY:  Remarkable for coronary artery disease.   REVIEW OF SYMPTOMS:  As above.   PHYSICAL EXAMINATION:  GENERAL:  Reveals a well-developed, well-nourished  female in no acute distress who is well in appearance.  VITAL SIGNS:  Temperature 97.7, pulse 81, blood pressure 126/77, respiratory  rate 18.  LUNGS:  Clear to auscultation bilaterally with normal respiratory effort.  CARDIOVASCULAR:  Regular rate and rhythm with no murmurs or rubs.  There is  no peripheral edema.  ABDOMEN:  Soft and nontender. It is nondistended. The ileostomy in the right  lower quadrant is pin and well perfused. There is no prolapse of it and  there is liquid stool in her ostomy bag.   IMPRESSION:  This is a patient with ileitis or partial small bowel  obstruction which now appears to have resolved. I suspect that she may have  had a twisting of her distal ileum around the ileostomy and this is now  resolved or she may have had viral gastroenteritis but in any event with IV  hydration she is now improved and she is adamant that she be discharged from  the hospital. At this point, I agree with her this could be done but I would  like her to stay on liquids for the time being. If she develops any  abdominal pain whatsoever, she is going to call my office and I will readmit  her to the hospital. She is also going to call Dr. Isabella Stalling and relay this to  him as well.                                               Coralie Keens, M.D.    DB/MEDQ  D:  10/26/2003  T:  10/27/2003  Job:  [id number]   cc:   Dr. Isabella Stalling, [facility]

## 2010-07-19 NOTE — Op Note (Signed)
[name], [name] NO.:  [id number]   MEDICAL RECORD NO.:  [id number]          PATIENT TYPE:  INP   LOCATION:  [id number]                         FACILITY:  [facility]   PHYSICIAN:  Coralie Keens, M.D. DATE OF BIRTH:  April 17, 1965   DATE OF PROCEDURE:  01/16/2004  DATE OF DISCHARGE:                                 OPERATIVE REPORT   PREOPERATIVE DIAGNOSIS:  Ileostomy.   POSTOPERATIVE DIAGNOSIS:  Ileostomy.   PROCEDURE:  Ileostomy takedown.   SURGEON:  Dr. Nathaneil Canary A. Ninfa Linden   ASSISTANT:  Dr. Jeanella Anton   ANESTHESIA:  General endotracheal.   ESTIMATED BLOOD LOSS:  Minimal.   INDICATIONS:  [name] is a pleasant 45 year old female, who has had  apparently a grade 4 episiotomy and damage to her sphincter muscles during  childbirth.  She ended up having sphincter repair at [facility] and a  protective loop ileostomy done laparoscopically to protect the sphincter  surgery.  This is now doing well, and she presents for ileostomy takedown.   PROCEDURE IN DETAIL:  The patient was brought to the operating room and  identified as [name].  She was placed supine on the operating table,  and general anesthesia was induced.  Her abdomen was then prepped and draped  in the usual sterile fashion.  Using a 10 blade, an elliptical incision was  made around the ileostomy in the right lower quadrant.  The incision was  carried down through the fascia which was opened around the small bowel  circumferentially with the electrocautery.  The proximal and distal loops of  the small bowel were then identified and brought up out of the incision.  Multiple adhesions from the bowel to the fascia were taken down with the  electrocautery.  The two ends of bowel were then reapproximated together  with a single interrupted silk suture.  The GI stapler was then placed  through each end of the ileostomy incision and then brought together,  bringing the two loops of  bowel together.  The stapler was then fired,  creating a side-to-side anastomosis.  The open end containing the previous  ileostomy was then transected with the GIA 60 stapler.  A wide anastomosis  appeared to be created.  At this point, hemostasis appeared to be achieved  with all the staple lines.  The small bowel was then placed back into the  peritoneal cavity.  The fascia was then closed in two layers with  interrupted #1 Novofil pop-off sutures.  The  skin was then irrigated and closed with skin staples.  The patient tolerated  the procedure well.  All counts were correct at the end of the procedure.  The patient was then extubated in the operating room and taken in stable  condition to the recovery room.      DB/MEDQ  D:  01/16/2004  T:  01/16/2004  Job:  [id number]

## 2010-11-14 ENCOUNTER — Ambulatory Visit: Payer: Medicaid Other | Admitting: Family Medicine

## 2010-11-21 ENCOUNTER — Encounter: Payer: Self-pay | Admitting: Family Medicine

## 2010-11-21 ENCOUNTER — Ambulatory Visit (INDEPENDENT_AMBULATORY_CARE_PROVIDER_SITE_OTHER): Payer: Medicaid Other | Admitting: Family Medicine

## 2010-11-21 VITALS — BP 141/81 | HR 79 | Temp 98.4°F | Ht 70.6 in | Wt 176.0 lb

## 2010-11-21 DIAGNOSIS — Z30430 Encounter for insertion of intrauterine contraceptive device: Secondary | ICD-10-CM

## 2010-11-21 DIAGNOSIS — Z309 Encounter for contraceptive management, unspecified: Secondary | ICD-10-CM

## 2010-11-21 LAB — POCT URINE PREGNANCY: Preg Test, Ur: NEGATIVE

## 2010-11-21 MED ORDER — LEVONORGESTREL 20 MCG/24HR IU IUD
INTRAUTERINE_SYSTEM | Freq: Once | INTRAUTERINE | Status: AC
  Administered 2010-11-21: 16:00:00 via INTRAUTERINE

## 2010-11-21 NOTE — Progress Notes (Signed)
  Subjective:    Patient ID: [name], female    DOB: 07-Jun-1965, 45 y.o.   MRN: [id number]  HPI  Here for IUD placement. Had an IUD previously and had no problems. Wants to return to that form of birth control. Denies fever, denies unusual vaginal discharge. Has no questions about the IUD.  Review of Systems  Denies fever, denies unusual vaginal discharge    Objective:   Physical Exam  GENERAL: Well developed, well nourished, no acute distress GU: Externally normal with some very mild atrophy. Bimanual is without adnexal mass. Uterus is slightly retroverted. Cervix is normal in appearance.  IUD INSERTION: Patient given informed consent, signed copy in the chart..  Negative pregnancy confirmed.  Appropriate time out taken.   Sterile instruments and technique was used. Cervix brought into view with use of speculum and then cleansed three times with  betadine swabs.  A tenaculum was placed into the anterior lip of the cervix and a uterine sound was used to measure uterine size.   A  MIRENA IUD was placed into the endometrial cavity, deployed and secured. The applicator was removed. The strings were trimmed to 2 centimeters.   There were no complications and the patient tolerated the procedure well.   She was given handouts for post procedure instructions and information about the IUD including a card with the time of recommended removal.       Assessment & Plan:  Contraceptive management. IUD placed. Patient given post procedure instructions.

## 2011-04-24 ENCOUNTER — Ambulatory Visit (INDEPENDENT_AMBULATORY_CARE_PROVIDER_SITE_OTHER): Payer: Medicaid Other | Admitting: Family Medicine

## 2011-04-24 ENCOUNTER — Encounter: Payer: Self-pay | Admitting: Family Medicine

## 2011-04-24 VITALS — BP 160/79 | HR 94 | Temp 98.1°F | Ht 70.5 in | Wt 171.3 lb

## 2011-04-24 DIAGNOSIS — E1065 Type 1 diabetes mellitus with hyperglycemia: Secondary | ICD-10-CM

## 2011-04-24 DIAGNOSIS — IMO0002 Reserved for concepts with insufficient information to code with codable children: Secondary | ICD-10-CM | POA: Insufficient documentation

## 2011-04-24 DIAGNOSIS — I10 Essential (primary) hypertension: Secondary | ICD-10-CM

## 2011-04-24 DIAGNOSIS — E119 Type 2 diabetes mellitus without complications: Secondary | ICD-10-CM

## 2011-04-24 LAB — POCT GLYCOSYLATED HEMOGLOBIN (HGB A1C): Hemoglobin A1C: >14

## 2011-04-24 LAB — POCT UA - MICROALBUMIN
Albumin/Creatinine Ratio, Urine, POC: ABNORMAL
Creatinine, POC: 50 mg/dL
Microalbumin Ur, POC: 30 mg/dL

## 2011-04-24 MED ORDER — OMEPRAZOLE MAGNESIUM 20 MG PO TBEC: 20.0000 mg | DELAYED_RELEASE_TABLET | Freq: Every day | ORAL | Status: DC

## 2011-04-24 MED ORDER — LISINOPRIL 20 MG PO TABS: 20.0000 mg | ORAL_TABLET | Freq: Every day | ORAL | Status: DC

## 2011-04-24 MED ORDER — HYDROXYZINE HCL 10 MG PO TABS: 10.0000 mg | ORAL_TABLET | Freq: Every evening | ORAL | Status: AC | PRN

## 2011-04-24 MED ORDER — INSULIN GLARGINE 100 UNIT/ML ~~LOC~~ SOLN: 10.0000 [IU] | Freq: Every day | SUBCUTANEOUS | Status: DC

## 2011-04-24 MED ORDER — HYDROCHLOROTHIAZIDE 25 MG PO TABS: 25.0000 mg | ORAL_TABLET | Freq: Every day | ORAL | Status: DC

## 2011-04-24 MED ORDER — GABAPENTIN 300 MG PO CAPS: 300.0000 mg | ORAL_CAPSULE | Freq: Three times a day (TID) | ORAL | Status: DC

## 2011-04-24 MED ORDER — INSULIN ASPART 100 UNIT/ML ~~LOC~~ SOLN: 10.0000 [IU] | Freq: Three times a day (TID) | SUBCUTANEOUS | Status: DC

## 2011-04-24 NOTE — Patient Instructions (Signed)
Blood Sugar Monitoring, Adult GLUCOSE METERS FOR SELF-MONITORING OF BLOOD GLUCOSE   It is important to be able to correctly measure your blood sugar (glucose). You can use a blood glucose monitor (a small battery-operated device) to check your glucose level at any time. This allows you and your caregiver to monitor your diabetes and to determine how well your treatment plan is working. The process of monitoring your blood glucose with a glucose meter is called self-monitoring of blood glucose (SMBG). When people with diabetes control their blood sugar, they have better health. To test for glucose with a typical glucose meter, place the disposable strip in the meter. Then place a small sample of blood on the "test strip." The test strip is coated with chemicals that combine with glucose in blood. The meter measures how much glucose is present. The meter displays the glucose level as a number. Several new models can record and store a number of test results. Some models can connect to personal computers to store test results or print them out.   Newer meters are often easier to use than older models. Some meters allow you to get blood from places other than your fingertip. Some new models have automatic timing, error codes, signals, or barcode readers to help with proper adjustment (calibration). Some meters have a large display screen or spoken instructions for people with visual impairments.   INSTRUCTIONS FOR USING GLUCOSE METERS  Wash your hands with soap and warm water, or clean the area with alcohol. Dry your hands completely.     Prick the side of your fingertip with a lancet (a sharp-pointed tool used by hand).     Hold the hand down and gently milk the finger until a small drop of blood appears. Catch the blood with the test strip.     Follow the instructions for inserting the test strip and using the SMBG meter. Most meters require the meter to be turned on and the test strip to be inserted  before applying the blood sample.     Record the test result.     Read the instructions carefully for both the meter and the test strips that go with it. Meter instructions are found in the user manual. Keep this manual to help you solve any problems that may arise. Many meters use "error codes" when there is a problem with the meter, the test strip, or the blood sample on the strip. You will need the manual to understand these error codes and fix the problem.     New devices are available such as laser lancets and meters that can test blood taken from "alternative sites" of the body, other than fingertips. However, you should use standard fingertip testing if your glucose changes rapidly. Also, use standard testing if:     You have eaten, exercised, or taken insulin in the past 2 hours.     You think your glucose is low.     You tend to not feel symptoms of low blood glucose (hypoglycemia).     You are ill or under stress.     Clean the meter as directed by the manufacturer.     Test the meter for accuracy as directed by the manufacturer.     Take your meter with you to your caregiver's office. This way, you can test your glucose in front of your caregiver to make sure you are using the meter correctly. Your caregiver can also take a sample of blood to test  using a routine lab method. If values on the glucose meter are close to the lab results, you and your caregiver will see that your meter is working well and you are using good technique. Your caregiver will advise you about what to do if the results do not match.  FREQUENCY OF TESTING   Your caregiver will tell you how often you should check your blood glucose. This will depend on your type of diabetes, your current level of diabetes control, and your types of medicines. The following are general guidelines, but your care plan may be different. Record all your readings and the time of day you took them for review with your caregiver.     Diabetes type 1.     When you are using insulin with good diabetic control (either multiple daily injections or via a pump), you should check your glucose 4 times a day.     If your diabetes is not well controlled, you may need to monitor more frequently, including before meals and 2 hours after meals, at bedtime, and occasionally between 2 a.m. and 3 a.m.     You should always check your glucose before a dose of insulin or before changing the rate on your insulin pump.     Diabetes type 2.     Guidelines for SMBG in diabetes type 2 are not as well defined.     If you are on insulin, follow the guidelines above.     If you are on medicines, but not insulin, and your glucose is not well controlled, you should test at least twice daily.     If you are not on insulin, and your diabetes is controlled with medicines or diet alone, you should test at least once daily, usually before breakfast.     A weekly profile will help your caregiver advise you on your care plan. The week before your visit, check your glucose before a meal and 2 hours after a meal at least daily. You may want to test before and after a different meal each day so you and your caregiver can tell how well controlled your blood sugars are throughout the course of a 24 hour period.     Gestational diabetes (diabetes during pregnancy).     Frequent testing is often necessary. Accurate timing is important.     If you are not on insulin, check your glucose 4 times a day. Check it before breakfast and 1 hour after the start of each meal.     If you are on insulin, check your glucose 6 times a day. Check it before each meal and 1 hour after the first bite of each meal.     General guidelines.     More frequent testing is required at the start of insulin treatment. Your caregiver will instruct you.     Test your glucose any time you suspect you have low blood sugar (hypoglycemia).     You should test more often when you  change medicines, when you have unusual stress or illness, or in other unusual circumstances.  OTHER THINGS TO KNOW ABOUT GLUCOSE METERS  Measurement Range. Most glucose meters are able to read glucose levels over a broad range of values from as low as 0 to as high as 600 mg/dL. If you get an extremely high or low reading from your meter, you should first confirm it with another reading. Report very high or very low readings to your caregiver.  Whole Blood Glucose versus Plasma Glucose. Some older home glucose meters measure glucose in your whole blood. In a lab or when using some newer home glucose meters, the glucose is measured in your plasma (one component of blood). The difference can be important. It is important for you and your caregiver to know whether your meter gives its results as "whole blood equivalent" or "plasma equivalent."     Display of High and Low Glucose Values. Part of learning how to operate a meter is understanding what the meter results mean. Know how high and low glucose concentrations are displayed on your meter.     Factors that Affect Glucose Meter Performance. The accuracy of your test results depends on many factors and varies depending on the brand and type of meter. These factors include:     Low red blood cell count (anemia).     Substances in your blood (such as uric acid, vitamin C, and others).     Environmental factors (temperature, humidity, altitude).     Name-brand versus generic test strips.     Calibration. Make sure your meter is set up properly. It is a good idea to do a calibration test with a control solution recommended by the manufacturer of your meter whenever you begin using a fresh bottle of test strips. This will help verify the accuracy of your meter.     Improperly stored, expired, or defective test strips. Keep your strips in a dry place with the lid on.     Soiled meter.     Inadequate blood sample.  NEW TECHNOLOGIES FOR GLUCOSE  TESTING Alternative site testing Some glucose meters allow testing blood from alternative sites. These include the:  Upper arm.     Forearm.    Base of the thumb.     Thigh.  Sampling blood from alternative sites may be desirable. However, it may have some limitations. Blood in the fingertips show changes in glucose levels more quickly than blood in other parts of the body. This means that alternative site test results may be different from fingertip test results, not because of the meter's ability to test accurately, but because the actual glucose concentration can be different.   Continuous Glucose Monitoring Devices to measure your blood glucose continuously are available, and others are in development. These methods can be more expensive than self-monitoring with a glucose meter. However, it is uncertain how effective and reliable these devices are. Your caregiver will advise you if this approach makes sense for you. IF BLOOD SUGARS ARE CONTROLLED, PEOPLE WITH DIABETES REMAIN HEALTHIER.   SMBG is an important part of the treatment plan of patients with diabetes mellitus. Below are reasons for using SMBG:    It confirms that your glucose is at a specific, healthy level.     It detects hypoglycemia and severe hyperglycemia.     It allows you and your caregiver to make adjustments in response to changes in lifestyle for individuals requiring medicine.     It determines the need for starting insulin therapy in temporary diabetes that happens during pregnancy (gestational diabetes).  Document Released: 02/20/2003 Document Revised: 10/31/2010 Document Reviewed: 06/13/2010 [facility] Patient Information 2012 [place].

## 2011-04-24 NOTE — Assessment & Plan Note (Signed)
Patient has poorly controlled diabetes. She was a type 2 diabetic treated with metformin. She has severe gi side effects to metformin. She has lost a lot of weight, but continues to be diabetic. I think she has become a type one diabetic at this point.  I have started Lantus 10 mg daily. And novolog with meals 10 units.  All the side effects and things to watch out for were explained. She will follow up in 2 weeks.   Next eye exam in march, she will schedule an appointment.

## 2011-04-24 NOTE — Progress Notes (Signed)
  Subjective:    Patient ID: [name], female    DOB: [birth year], 46 y.o.   MRN: [id number]  HPI SUBJECTIVE: 46 y.o. female for follow up of diabetes. Diabetic Review of Systems - medication compliance: noncompliant some of the time, diabetic diet compliance: noncompliant some of the time, home glucose monitoring: is performed sporadically, further diabetic ROS: no polyuria or polydipsia, no chest pain, dyspnea or TIA's, weight has decreased, has dysesthesias in the feet as well as pain and tingling, last eye exam approximately last march, acute symptoms are leg pain.  Other symptoms and concerns: metformin hurts stomach. Lab Results  Component Value Date   HGBA1C >14.0 04/24/2011   Review of Systems Pertinent items are noted in HPI. No fever, chills, night sweats, weight loss.     Objective:   Physical Exam BP 160/79  Pulse 94  Temp(Src) 98.1 F (36.7 C) (Oral)  Ht 5' 10.5" (1.791 m)  Wt 171 lb 4.8 oz (77.701 kg)  BMI 24.23 kg/m2 Exam: fundi normal, heart sounds normal rate, regular rhythm, normal S1, S2, no murmurs, rubs, clicks or gallops, chest clear, no hepatosplenomegaly, no carotid bruits, feet: warm, good capillary refill, bunions, nail exam normal nails without lesions, no trophic changes or ulcerative lesions, normal DP and PT pulses and normal sensory exam     Assessment & Plan:  ASSESSMENT: Diabetes Mellitus: poorly controlled PLAN: See orders for this visit as documented in the electronic medical record. Issues reviewed with her: diabetic diet discussed in detail, written exchange diet given, home glucose monitoring emphasized, all medications, side effects and compliance discussed carefully, use and side effects of insulin is taught, annual eye examinations at Ophthalmology discussed and patient urged in the strongest terms to quit smoking.

## 2011-04-24 NOTE — Assessment & Plan Note (Signed)
Increased lisinopril to 20 from 10. Added HCTZ BP Readings from Last 3 Encounters:  04/24/11 160/79  11/21/10 141/81  05/16/10 149/81

## 2011-04-30 ENCOUNTER — Telehealth: Payer: Self-pay | Admitting: Family Medicine

## 2011-04-30 MED ORDER — BLOOD GLUCOSE TEST VI STRP: ORAL_STRIP | Status: DC

## 2011-04-30 NOTE — Telephone Encounter (Signed)
Patient notified that rx has been sent to pharmacy  

## 2011-04-30 NOTE — Telephone Encounter (Signed)
Needs more test strips for her One Touch Ultra mini - just got new one and there wasn't many in there.  [facility]- [place]

## 2011-05-02 ENCOUNTER — Other Ambulatory Visit: Payer: Self-pay | Admitting: Family Medicine

## 2011-05-02 DIAGNOSIS — E119 Type 2 diabetes mellitus without complications: Secondary | ICD-10-CM

## 2011-05-02 MED ORDER — ONETOUCH ULTRA SYSTEM W/DEVICE KIT: 1.0000 | PACK | Freq: Once | Status: DC

## 2011-05-02 MED ORDER — GLUCOSE BLOOD VI STRP: ORAL_STRIP | Status: DC

## 2011-05-09 ENCOUNTER — Ambulatory Visit (INDEPENDENT_AMBULATORY_CARE_PROVIDER_SITE_OTHER): Payer: Medicaid Other | Admitting: Family Medicine

## 2011-05-09 ENCOUNTER — Encounter: Payer: Self-pay | Admitting: Family Medicine

## 2011-05-09 DIAGNOSIS — E1065 Type 1 diabetes mellitus with hyperglycemia: Secondary | ICD-10-CM

## 2011-05-09 MED ORDER — DULOXETINE HCL 30 MG PO CPEP: 30.0000 mg | ORAL_CAPSULE | Freq: Every day | ORAL | Status: DC

## 2011-05-09 NOTE — Progress Notes (Signed)
  Subjective:    Patient ID: [name], female    DOB: 1965/12/11, 46 y.o.   MRN: [id number]  Diabetes   SUBJECTIVE: 46 y.o. female for follow up of Diabetes. Last visit I dc'd metformin because of tolerance and bad Hga1c.  I added lantus 10 mg daily and 10 mg of novolog with meals. She is complaint with diet and new meds. Did not bring in meter, but she reports averages in the 170's. No falls/syncope/hypoglycemia. Still complains of bilateral nerve pain worse at night.   Lab Results  Component Value Date   HGBA1C >14.0 04/24/2011   Review of Systems Pertinent items are noted in HPI. No fever, chills, night sweats, weight loss.     Objective:   Physical Exam Filed Vitals:   05/09/11 1136  BP: 134/76  Pulse: 86  Temp: 98.4 F (36.9 C)  TempSrc: Oral  Height: 5' 10.5" (1.791 m)  Weight: 175 lb (79.379 kg)    Exam: fundi normal, heart sounds normal rate, regular rhythm, normal S1, S2, no murmurs, rubs, clicks or gallops, chest clear, no hepatosplenomegaly, no carotid bruits, feet: warm, good capillary refill, bunions, nail exam normal nails without lesions, no trophic changes or ulcerative lesions, normal DP and PT pulses and normal sensory exam    Assessment & Plan:

## 2011-05-09 NOTE — Patient Instructions (Signed)
It was great to see you today!  Schedule an appointment to see me may 21st..  Great work on your diabetes control.  Lantus 15 units daily  Increase mealtime insulin by 1 unit to get to a target of 140 blood sugar.

## 2011-05-09 NOTE — Assessment & Plan Note (Signed)
Increase lantus to 15 mg qam Increase novolog by one unit per admin. Titrate to BS of 140 average. She will return May 21st for repeat HgA1c check.  Will start cymbalta 30 mg daily for her nerve pain.

## 2011-05-12 ENCOUNTER — Telehealth: Payer: Self-pay | Admitting: Family Medicine

## 2011-05-12 NOTE — Telephone Encounter (Signed)
The Rx for test strips from 05/02/11, never made it to [facility] on [address].

## 2011-05-12 NOTE — Telephone Encounter (Signed)
RX for strips for one touch mini was sent to pharmacy on 02/27. Then Dr. Vallarie Mare sent in [place] according to pharmacist for [place] on 05/02/2011.  Patient did not pick up within 10 days so it was put back on the shelf. According to pharmacist medicaid will not pay for One Touch Mini.  Gave verbal RX for Lancets also for Accu-chek Aviva , box of #102 and 3 refills. Message left on voicemail for patient  of the problem and that pharmacist will be dispensing new moniter and strips.

## 2011-05-23 ENCOUNTER — Ambulatory Visit: Payer: Medicaid Other | Admitting: Family Medicine

## 2011-05-26 ENCOUNTER — Other Ambulatory Visit: Payer: Self-pay | Admitting: Family Medicine

## 2011-05-26 MED ORDER — HYDROXYZINE HCL 10 MG PO TABS: 10.0000 mg | ORAL_TABLET | Freq: Four times a day (QID) | ORAL | Status: AC | PRN

## 2011-05-28 ENCOUNTER — Ambulatory Visit: Payer: Medicaid Other

## 2011-05-29 ENCOUNTER — Ambulatory Visit: Payer: Medicaid Other

## 2011-07-24 ENCOUNTER — Ambulatory Visit: Payer: Medicaid Other | Admitting: Family Medicine

## 2011-08-04 ENCOUNTER — Ambulatory Visit (INDEPENDENT_AMBULATORY_CARE_PROVIDER_SITE_OTHER): Payer: Medicaid Other | Admitting: Family Medicine

## 2011-08-04 ENCOUNTER — Encounter: Payer: Self-pay | Admitting: Family Medicine

## 2011-08-04 VITALS — BP 160/85 | HR 80 | Temp 98.3°F | Ht 70.5 in | Wt 189.0 lb

## 2011-08-04 DIAGNOSIS — E1149 Type 2 diabetes mellitus with other diabetic neurological complication: Secondary | ICD-10-CM

## 2011-08-04 DIAGNOSIS — I10 Essential (primary) hypertension: Secondary | ICD-10-CM

## 2011-08-04 DIAGNOSIS — E1142 Type 2 diabetes mellitus with diabetic polyneuropathy: Secondary | ICD-10-CM

## 2011-08-04 DIAGNOSIS — E1140 Type 2 diabetes mellitus with diabetic neuropathy, unspecified: Secondary | ICD-10-CM | POA: Insufficient documentation

## 2011-08-04 DIAGNOSIS — E1065 Type 1 diabetes mellitus with hyperglycemia: Secondary | ICD-10-CM

## 2011-08-04 LAB — POCT GLYCOSYLATED HEMOGLOBIN (HGB A1C): Hemoglobin A1C: 10

## 2011-08-04 MED ORDER — INSULIN GLARGINE 100 UNIT/ML ~~LOC~~ SOLN: 17.0000 [IU] | Freq: Every day | SUBCUTANEOUS | Status: DC

## 2011-08-04 MED ORDER — AMLODIPINE BESYLATE 10 MG PO TABS: 10.0000 mg | ORAL_TABLET | Freq: Every day | ORAL | Status: DC

## 2011-08-04 MED ORDER — NORTRIPTYLINE HCL 50 MG PO CAPS: 50.0000 mg | ORAL_CAPSULE | Freq: Every day | ORAL | Status: DC

## 2011-08-04 MED ORDER — GABAPENTIN 300 MG PO CAPS: 600.0000 mg | ORAL_CAPSULE | Freq: Three times a day (TID) | ORAL | Status: DC

## 2011-08-04 MED ORDER — DULOXETINE HCL 30 MG PO CPEP: 60.0000 mg | ORAL_CAPSULE | Freq: Every day | ORAL | Status: DC

## 2011-08-04 NOTE — Assessment & Plan Note (Signed)
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 Will dc HCTZ. Will continue lisinopril, will start amlodipine 10 BP Readings from Last 3 Encounters:  08/04/11 160/85  05/09/11 134/76  04/24/11 160/79

## 2011-08-04 NOTE — Patient Instructions (Signed)
Meds ordered this encounter  Medications  . insulin glargine (LANTUS SOLOSTAR) 100 UNIT/ML injection    Sig: Inject 17 Units into the skin daily.    Dispense:  5 pen    Refill:  PRN  . gabapentin (NEURONTIN) 300 MG capsule    Sig: Take 2 capsules (600 mg total) by mouth 3 (three) times daily. Take at bedtime for one week then increase to two times a day    Dispense:  90 capsule    Refill:  6  . DISCONTD: DULoxetine (CYMBALTA) 30 MG capsule    Sig: Take 2 capsules (60 mg total) by mouth daily.    Dispense:  30 capsule    Refill:  11  . nortriptyline (PAMELOR) 50 MG capsule    Sig: Take 1 capsule (50 mg total) by mouth at bedtime.    Dispense:  30 capsule    Refill:  11  . amLODipine (NORVASC) 10 MG tablet    Sig: Take 1 tablet (10 mg total) by mouth daily.    Dispense:  90 tablet    Refill:  3  We are changing your blood pressure meds from HCTZ ro Amlodipine We are increasing your Lantus to 17 - good job on your HgA1c I am getting ABI's on your legs to make sure your pain is not vascular instead of neuropathy.  I am increasing your neurontin to 600 mg Three times a day. I am stopping your Cymbalta and starting Nortriptyline - take this at night it will make you sleepy.

## 2011-08-04 NOTE — Assessment & Plan Note (Signed)
I have attempted to control this with neurontin and cymbalta. Pushed cymbalta to 60 daily without much relief. Will increase neurontin to 600 tid from 300 tid. I will get ABI's to rule out vascular cause of leg pain - she does have pain in the legs and thighs, although she says its worse with rest and in bed and in her feet, but describes it as a deep ache and nerve meds have helped little.

## 2011-08-04 NOTE — Assessment & Plan Note (Signed)
Doing better HgA1c from >14 to 10 Increasing lantus to 17 daily. Will recheck in 3 months

## 2011-08-04 NOTE — Progress Notes (Signed)
  Subjective:   Patient ID: [name], female DOB: December 31, 1965 46 y.o. MRN: [id number] HPI:  1. DM2 Course: improving Synopsis: patient has had DM2 for sometime. She was seen by me three months ago and we started to adjust her regimen. She had a HgA1c of >14 today it is 10. She has 30 day average BS of 194. She is checking her sugars. She is complaint with diet and meds. She is currently on 12 of lantus daily and 22-24 units of novolog with meals.  No polyuria or polydipsia, no infections.   2. Hypertension   taking medications as instructed, no medication side effects noted, no TIA's, no chest pain on exertion, no dyspnea on exertion and no swelling of ankles.  Leg pain present at night associated with DM neuropathy. BP Readings from Last 3 Encounters:  08/04/11 160/85  05/09/11 134/76  04/24/11 160/79   3. Diabetic neuropathy, painful,  Course: worsening Synopsis: patient has been treated with neurontin 300 tid without improvement, cymbalta 60 daily without improvement. Worse at night when lying, no change with walking, she is smoker, pain is lower thighs calves, and worse in legs, described as a deep ache, no superficial burning.  Pulses are present and normal. No skin color change.   History  Substance Use Topics  . Smoking status: Current Everyday Smoker    Types: Cigarettes  . Smokeless tobacco: Not on file  . Alcohol Use: Yes    Review of Systems: Pertinent items are noted in HPI.  Labs Reviewed: yes.  Lab Results  Component Value Date   HGBA1C 10.0 08/04/2011   Reviewed Chart Review for last notes.     Objective:   Filed Vitals:   08/04/11 0959  BP: 160/85  Pulse: 80  Temp: 98.3 F (36.8 C)  TempSrc: Oral  Height: 5' 10.5" (1.791 m)  Weight: 189 lb (85.73 kg)   Physical Exam: General: c/f, nad, overweight, pleasant. Body mass index is 26.74 kg/(m^2). Lungs:  Normal respiratory effort, chest expands symmetrically. Lungs are clear to auscultation, no  crackles or wheezes. Heart - Regular rate and rhythm.  No murmurs, gallops or rubs.    Extremities:   Non-tender, No cyanosis, edema, or deformity noted. Diabetic Foot Check -  Appearance - no lesions, ulcers or calluses Skin - no unusual pallor or redness Monofilament testing -  Right - Great toe, medial, central, lateral ball and posterior foot intact Left - Great toe, medial, central, lateral ball and posterior foot intact Pulses normal bilateral.   Assessment & Plan:

## 2011-08-12 ENCOUNTER — Ambulatory Visit (HOSPITAL_COMMUNITY)
Admission: RE | Admit: 2011-08-12 | Discharge: 2011-08-12 | Disposition: A | Discharge status: Home / Self Care | Payer: Medicaid Other | Source: Ambulatory Visit | Attending: Family Medicine | Admitting: Family Medicine

## 2011-08-12 DIAGNOSIS — E1065 Type 1 diabetes mellitus with hyperglycemia: Secondary | ICD-10-CM

## 2011-08-12 DIAGNOSIS — E1149 Type 2 diabetes mellitus with other diabetic neurological complication: Secondary | ICD-10-CM | POA: Insufficient documentation

## 2011-08-12 DIAGNOSIS — E1142 Type 2 diabetes mellitus with diabetic polyneuropathy: Secondary | ICD-10-CM | POA: Insufficient documentation

## 2011-08-12 DIAGNOSIS — M79609 Pain in unspecified limb: Secondary | ICD-10-CM

## 2011-08-12 NOTE — Progress Notes (Signed)
VASCULAR LAB PRELIMINARY  ARTERIAL  ABI completed:  Bilateral ABIs within normal limits.    RIGHT    LEFT    PRESSURE WAVEFORM  PRESSURE WAVEFORM  BRACHIAL 155 triphasic BRACHIAL 143 triphasic  DP   DP    AT 177 biphasic AT 154 biiphasic  PT 168 biphasic PT 163 biphasic  PER   PER    GREAT TOE  NA GREAT TOE  NA    RIGHT LEFT  ABI 1.14 1.05     Judithann Sauger, RVT 08/12/2011, 11:49 AM

## 2011-09-24 ENCOUNTER — Ambulatory Visit: Payer: Medicaid Other | Admitting: Family Medicine

## 2011-11-20 ENCOUNTER — Ambulatory Visit (INDEPENDENT_AMBULATORY_CARE_PROVIDER_SITE_OTHER): Payer: Medicaid Other | Admitting: Family Medicine

## 2011-11-20 ENCOUNTER — Ambulatory Visit (HOSPITAL_COMMUNITY)
Admission: RE | Admit: 2011-11-20 | Discharge: 2011-11-20 | Disposition: A | Discharge status: Home / Self Care | Payer: Medicaid Other | Source: Ambulatory Visit | Attending: Family Medicine | Admitting: Family Medicine

## 2011-11-20 ENCOUNTER — Encounter: Payer: Self-pay | Admitting: Family Medicine

## 2011-11-20 VITALS — BP 161/79 | HR 88 | Temp 99.4°F | Ht 70.5 in | Wt 182.6 lb

## 2011-11-20 DIAGNOSIS — I10 Essential (primary) hypertension: Secondary | ICD-10-CM

## 2011-11-20 DIAGNOSIS — B373 Candidiasis of vulva and vagina: Secondary | ICD-10-CM | POA: Insufficient documentation

## 2011-11-20 DIAGNOSIS — E1142 Type 2 diabetes mellitus with diabetic polyneuropathy: Secondary | ICD-10-CM

## 2011-11-20 DIAGNOSIS — R079 Chest pain, unspecified: Secondary | ICD-10-CM | POA: Insufficient documentation

## 2011-11-20 DIAGNOSIS — R0789 Other chest pain: Secondary | ICD-10-CM

## 2011-11-20 DIAGNOSIS — R112 Nausea with vomiting, unspecified: Secondary | ICD-10-CM | POA: Insufficient documentation

## 2011-11-20 DIAGNOSIS — E1065 Type 1 diabetes mellitus with hyperglycemia: Secondary | ICD-10-CM

## 2011-11-20 DIAGNOSIS — E1149 Type 2 diabetes mellitus with other diabetic neurological complication: Secondary | ICD-10-CM

## 2011-11-20 DIAGNOSIS — R350 Frequency of micturition: Secondary | ICD-10-CM

## 2011-11-20 DIAGNOSIS — E1140 Type 2 diabetes mellitus with diabetic neuropathy, unspecified: Secondary | ICD-10-CM

## 2011-11-20 LAB — CBC
HCT: 44.5 % (ref 36.0–46.0)
Hemoglobin: 15.2 g/dL — ABNORMAL HIGH (ref 12.0–15.0)
MCH: 29.6 pg (ref 26.0–34.0)
MCHC: 34.2 g/dL (ref 30.0–36.0)
MCV: 86.6 fL (ref 78.0–100.0)
Platelets: 318 10*3/uL (ref 150–400)
RBC: 5.14 MIL/uL — ABNORMAL HIGH (ref 3.87–5.11)
RDW: 13.8 % (ref 11.5–15.5)
WBC: 14.5 10*3/uL — ABNORMAL HIGH (ref 4.0–10.5)

## 2011-11-20 LAB — POCT URINALYSIS DIPSTICK
Bilirubin, UA: NEGATIVE
Glucose, UA: 500
Ketones, UA: NEGATIVE
Leukocytes, UA: NEGATIVE
Nitrite, UA: NEGATIVE
Protein, UA: NEGATIVE
Spec Grav, UA: 1.01
Urobilinogen, UA: 0.2
pH, UA: 5.5

## 2011-11-20 LAB — POCT UA - MICROSCOPIC ONLY

## 2011-11-20 LAB — POCT GLYCOSYLATED HEMOGLOBIN (HGB A1C): Hemoglobin A1C: 13.6

## 2011-11-20 MED ORDER — FLUCONAZOLE 150 MG PO TABS: 150.0000 mg | ORAL_TABLET | Freq: Once | ORAL | Status: DC

## 2011-11-20 MED ORDER — METOCLOPRAMIDE HCL 5 MG PO TABS: 5.0000 mg | ORAL_TABLET | Freq: Four times a day (QID) | ORAL | Status: DC

## 2011-11-20 NOTE — Assessment & Plan Note (Signed)
Diflucan to treat

## 2011-11-20 NOTE — Patient Instructions (Addendum)
For your current symptoms, take the Reglan with each meal and before bed. Take this for 2 weeks and then stop.  I will put in a referral for a cardiologist today. If you haven't heard anything by next week let me know.  The EKG showed that you might possibly have had damage to your heart in the past.  This is why I want you to see the cardiologist.   Increase your Lantus to 15 units each night. Check your blood sugar the next AM Increase to 2 units on a daily basis of Lantus until your AM blood sugar is 150.   Stop at that dose. Come back and see [place] after you've been seen at the cardiologist.   The urine test showed a yeast infection.  Take the Diflucan 1 pill for this.

## 2011-11-20 NOTE — Assessment & Plan Note (Signed)
Has been on Cymbalta in past and Neurontin currently. No real improvement. Discussed that getting her blood sugars under control will help with this.

## 2011-11-20 NOTE — Assessment & Plan Note (Addendum)
EKG showed possible Q wave Lead III, equivocal changes AVF.   No current chest pain and non for past 4 days.  No ST segment changes.  No T wave inversions, not indicative of current ongoing ischemia or infarction. Concerned she may possibly have had silent MI in past, or that the above symptoms of nausea and vomiting possibly were indicative of atypical MI in female uncontrolled diabetic.  Currently everyday smoker, counseled to quit for heart and overall health. Due to her extensive risk factors and possible past ischemic damage to her heart, referring her to cardiology today. I provided the patient with explicit warnings and red flags that would prompt return to clinic or the ED.

## 2011-11-20 NOTE — Assessment & Plan Note (Signed)
Overarching issue in this patient.   Med rec shows she should be on 17 units of Lantus, but she is only taking 12. Increase to 15 today -- see instructions for further increase. Getting CBGs under control will contribute to improvement in all other symptoms.

## 2011-11-20 NOTE — Progress Notes (Signed)
Patient ID: [name], female   DOB: [birth year], 46 y.o.   MRN: [id number] [name] is a 46 y.o. female who presents to [facility] today for nausea, vomiting, and diarrhea x 2 weeks:  1.  N/V/D:  Patient describes these symptoms for the past 2 weeks.  Gradually increasing in nature.  Has 2-3 episodes of nausea and vomiting per day.  Also with diarrhea, which is evidently chronic for her as she has IBS.  2-3 episodes per day as well.    Denies any abdominal pain, but does have some Right back and flank pain.    2.  Chest pain:  She also describes some chest tightness that has occurred twice a day while at rest.  Describes this as chest tightness and associated with shortness of breath.  Resolves in about 5-10 minutes after she changes position.  Not related to onset of nausea.  She has not had any pain for past 4 days.  No fevers or chills.    3.  Diabetes:  Currently on She states she takes about 12 units of Lantus a day.  States she takes Novolog daily.    No adverse effects from medication.  No hypoglycemic events.  Complains of both paresthesias and chronic peripheral nerve pain.  Measures blood sugars at home every:  AM, they run 250s.   Lab Results  Component Value Date   HGBA1C 13.6 11/20/2011      The following portions of the patient's history were reviewed and updated as appropriate: allergies, current medications, past medical history, family and social history, and problem list.  Patient is a nonsmoker.  No past medical history on file.  ROS as above otherwise neg, except for some polyuria and mild increased vaginal itching. Medications reviewed. Current Outpatient Prescriptions  Medication Sig Dispense Refill  . amLODipine (NORVASC) 10 MG tablet Take 1 tablet (10 mg total) by mouth daily.  90 tablet  3  . Blood Glucose Monitoring Suppl (ONE TOUCH ULTRA SYSTEM KIT) W/DEVICE KIT 1 kit by Does not apply route once.  1 each  0  . dicyclomine (BENTYL) 20 MG tablet Take 1 tablet  (20 mg total) by mouth every 6 (six) hours.  120 tablet  5  . gabapentin (NEURONTIN) 300 MG capsule Take 2 capsules (600 mg total) by mouth 3 (three) times daily. Take at bedtime for one week then increase to two times a day  90 capsule  6  . glucose blood (ACCU-CHEK AVIVA) test strip Check blood sugar with meals and ac/hs  100 each  12  . insulin aspart (NOVOLOG FLEXPEN) 100 UNIT/ML injection Inject 10 Units into the skin 3 (three) times daily before meals.  5 pen  prn  . insulin glargine (LANTUS SOLOSTAR) 100 UNIT/ML injection Inject 17 Units into the skin daily.  5 pen  PRN  . lisinopril (PRINIVIL,ZESTRIL) 20 MG tablet Take 1 tablet (20 mg total) by mouth daily.  90 tablet  6  . nortriptyline (PAMELOR) 50 MG capsule Take 1 capsule (50 mg total) by mouth at bedtime.  30 capsule  11  . omeprazole (PRILOSEC OTC) 20 MG tablet Take 1 tablet (20 mg total) by mouth daily.  90 tablet  6    Exam:  BP 161/79  Pulse 88  Temp 99.4 F (37.4 C) (Oral)  Ht 5' 10.5" (1.791 m)  Wt 182 lb 9.6 oz (82.827 kg)  BMI 25.83 kg/m2 Gen: Well NAD HEENT: EOMI, somewhat dry mucus membranes Pulm:  Clear  to auscultation bilaterally with good air movement.  No wheezes or rales noted.   Cardiac:  Regular rate and rhythm without murmur auscultated.  Good S1/S2. Chest:  Nontender to palpation. Abd:  Soft/nondistended/nontender.  Good bowel sounds throughout all four quadrants.  No masses noted.  Back:  Some vague CVA tenderness on Right side. Exts: Non edematous BL  LE, warm and well perfused.   No results found for this or any previous visit (from the past 72 hour(s)).

## 2011-11-20 NOTE — Assessment & Plan Note (Signed)
Believe this is likely secondary to early gastroparesis. Discuss that control of her blood sugars will help this. No fevers or chills. No abdominal pain. No red flags. Please see chest pain above. Starting Reglan for the next 2 weeks for symptomatic improvement. Informed consent regarding Reglan obtained. Followup in 1-2 weeks or after she seen a cardiologist, whichever is first. Followup sooner if she is having no improvement especially with Reglan.

## 2011-11-20 NOTE — Assessment & Plan Note (Signed)
Not at goal today. Simply as one more risk factor for her long-term heart health. No changes made today. We'll defer to PCP. Potential for already overwhelming visit with everything we discussed today.

## 2011-11-21 ENCOUNTER — Encounter: Payer: Self-pay | Admitting: Family Medicine

## 2011-11-21 LAB — COMPREHENSIVE METABOLIC PANEL
ALT: 12 U/L (ref 0–35)
AST: 11 U/L (ref 0–37)
Albumin: 4.2 g/dL (ref 3.5–5.2)
Alkaline Phosphatase: 76 U/L (ref 39–117)
BUN: 14 mg/dL (ref 6–23)
CO2: 27 mEq/L (ref 19–32)
Calcium: 10 mg/dL (ref 8.4–10.5)
Chloride: 101 mEq/L (ref 96–112)
Creat: 0.81 mg/dL (ref 0.50–1.10)
Glucose, Bld: 136 mg/dL — ABNORMAL HIGH (ref 70–99)
Potassium: 4 mEq/L (ref 3.5–5.3)
Sodium: 138 mEq/L (ref 135–145)
Total Bilirubin: 0.5 mg/dL (ref 0.3–1.2)
Total Protein: 7.5 g/dL (ref 6.0–8.3)

## 2011-11-21 LAB — LDL CHOLESTEROL, DIRECT: Direct LDL: 106 mg/dL — ABNORMAL HIGH

## 2011-11-25 ENCOUNTER — Ambulatory Visit (INDEPENDENT_AMBULATORY_CARE_PROVIDER_SITE_OTHER): Payer: Medicaid Other | Admitting: Cardiology

## 2011-11-25 ENCOUNTER — Encounter: Payer: Self-pay | Admitting: Cardiology

## 2011-11-25 VITALS — BP 148/82 | HR 74 | Ht 70.0 in | Wt 191.8 lb

## 2011-11-25 DIAGNOSIS — F17200 Nicotine dependence, unspecified, uncomplicated: Secondary | ICD-10-CM

## 2011-11-25 DIAGNOSIS — E1065 Type 1 diabetes mellitus with hyperglycemia: Secondary | ICD-10-CM

## 2011-11-25 DIAGNOSIS — IMO0002 Reserved for concepts with insufficient information to code with codable children: Secondary | ICD-10-CM

## 2011-11-25 DIAGNOSIS — I10 Essential (primary) hypertension: Secondary | ICD-10-CM

## 2011-11-25 DIAGNOSIS — R0789 Other chest pain: Secondary | ICD-10-CM

## 2011-11-25 NOTE — Patient Instructions (Signed)
We will schedule you for a stress Echo.

## 2011-11-25 NOTE — Progress Notes (Signed)
[name] Date of Birth: 1965-10-07 Medical Record [id number]  History of Present Illness: [name] is seen at the request of Dr. Mingo Amber for evaluation of chest pain. She is a pleasant 46 year old white female with long-standing history of insulin-dependent diabetes mellitus. She has diabetic neuropathy. She also has a history of hypertension, tobacco abuse, and family history of early coronary disease. She states that she was sick for 2 weeks with nausea, vomiting, diarrhea. With this she experienced pain in her right lower rib cage radiating into her back. While lying down she developed some chest tightness. Her gastrointestinal symptoms have resolved. She still has some vague symptoms of chest tightness. She is generally quite active and is working for different part-time jobs. She reports that prior ECG was concerning for possible old heart attack.  Current Outpatient Prescriptions on File Prior to Visit  Medication Sig Dispense Refill  . Blood Glucose Monitoring Suppl (ONE TOUCH ULTRA SYSTEM KIT) W/DEVICE KIT 1 kit by Does not apply route once.  1 each  0  . glucose blood (ACCU-CHEK AVIVA) test strip Check blood sugar with meals and ac/hs  100 each  12  . insulin aspart (NOVOLOG FLEXPEN) 100 UNIT/ML injection Inject 10 Units into the skin 3 (three) times daily before meals.  5 pen  prn  . insulin glargine (LANTUS SOLOSTAR) 100 UNIT/ML injection Inject 17 Units into the skin daily.  5 pen  PRN  . lisinopril (PRINIVIL,ZESTRIL) 20 MG tablet Take 1 tablet (20 mg total) by mouth daily.  90 tablet  6  . metoCLOPramide (REGLAN) 5 MG tablet Take 1 tablet (5 mg total) by mouth 4 (four) times daily. X 15 days  60 tablet  0  . nortriptyline (PAMELOR) 50 MG capsule Take 1 capsule (50 mg total) by mouth at bedtime.  30 capsule  11  . omeprazole (PRILOSEC OTC) 20 MG tablet Take 1 tablet (20 mg total) by mouth daily.  90 tablet  6  . dicyclomine (BENTYL) 20 MG tablet Take 1 tablet (20 mg total)  by mouth every 6 (six) hours.  120 tablet  5    Allergies  Allergen Reactions  . Latex     Past Medical History  Diagnosis Date  . Diabetes type 1, uncontrolled   . Chest tightness   . Urinary frequency   . Nausea and vomiting in adult   . Diabetic neuropathy   . Essential hypertension   . Yeast vaginitis     Past Surgical History  Procedure Date  . Endometrial ablation     History  Smoking status  . Current Every Day Smoker  . Types: Cigarettes  Smokeless tobacco  . Not on file    History  Alcohol Use  . Yes    Family History  Problem Relation Age of Onset  . Diabetes Mother   . Heart attack Mother   . Diabetes Father   . Heart attack Father     Review of Systems: The review of systems is positive for neuropathic pain in her feet.  She reports she has reduced her smoking from 2 packs per day  to less than one half pack per day. All other systems were reviewed and are negative.  Physical Exam: BP 148/82  Pulse 74  Ht 5\' 10"  (1.778 m)  Wt 191 lb 12.8 oz (87 kg)  BMI 27.52 kg/m2 She is a pleasant white female in no acute distress.The patient is alert and oriented x 3.  The mood and  affect are normal.  The skin is warm and dry.  Color is normal.  The HEENT exam reveals that the sclera are nonicteric.  The mucous membranes are moist.  The carotids are 2+ without bruits.  There is no thyromegaly.  There is no JVD.  The lungs are clear.  The chest wall is non tender.  The heart exam reveals a regular rate with a normal S1 and S2.  There are no murmurs, gallops, or rubs.  The PMI is not displaced.   Abdominal exam reveals good bowel sounds.  There is no guarding or rebound.  There is no hepatosplenomegaly or tenderness.  There are no masses.  Exam of the legs reveal no clubbing, cyanosis, or edema.  The legs are without rashes.  The distal pulses are intact.  Cranial nerves II - XII are intact.  Motor and sensory functions are intact.  The gait is normal.  LABORATORY  DATA:  ECG today demonstrates normal sinus rhythm with a normal ECG. Prior ECG from 11/20/2011 was also normal. I think there was a concern about Q waves in leads 3 and aVF but in fact she does have a small R wave in lead 3 Assessment / Plan: 1. Atypical chest pain. Patient has very strong risk factors for coronary disease with long-standing insulin-dependent diabetes mellitus, hypertension, tobacco abuse, and strong family history of coronary disease. I have recommended a stress echo to further stratify her cardiac risk. If normal would focus on aggressive risk factor modification.  2. Insulin-dependent diabetes mellitus. Poorly controlled. Last A1c was 13.6%.  3. Hypertension.  4. Tobacco abuse. Counseled on the importance of smoking cessation.

## 2011-12-08 ENCOUNTER — Other Ambulatory Visit: Payer: Self-pay | Admitting: Family Medicine

## 2011-12-08 DIAGNOSIS — F329 Major depressive disorder, single episode, unspecified: Secondary | ICD-10-CM

## 2011-12-08 DIAGNOSIS — F32A Depression, unspecified: Secondary | ICD-10-CM

## 2011-12-08 NOTE — Telephone Encounter (Signed)
Called and LVM for pt to call back so that she can make an appt to be seen by her new pcp and get refills on her meds.[name]

## 2011-12-08 NOTE — Telephone Encounter (Signed)
Patient is calling for a refill on her Wellbutrin to be sent to [facility] on [address].

## 2011-12-09 ENCOUNTER — Ambulatory Visit (HOSPITAL_COMMUNITY): Payer: Medicaid Other | Attending: Cardiology

## 2011-12-09 ENCOUNTER — Encounter: Payer: Self-pay | Admitting: Cardiology

## 2011-12-09 DIAGNOSIS — R0989 Other specified symptoms and signs involving the circulatory and respiratory systems: Secondary | ICD-10-CM

## 2011-12-09 DIAGNOSIS — E1142 Type 2 diabetes mellitus with diabetic polyneuropathy: Secondary | ICD-10-CM | POA: Insufficient documentation

## 2011-12-09 DIAGNOSIS — R0789 Other chest pain: Secondary | ICD-10-CM

## 2011-12-09 DIAGNOSIS — Z8249 Family history of ischemic heart disease and other diseases of the circulatory system: Secondary | ICD-10-CM | POA: Insufficient documentation

## 2011-12-09 DIAGNOSIS — F17200 Nicotine dependence, unspecified, uncomplicated: Secondary | ICD-10-CM

## 2011-12-09 DIAGNOSIS — I10 Essential (primary) hypertension: Secondary | ICD-10-CM

## 2011-12-09 DIAGNOSIS — E1149 Type 2 diabetes mellitus with other diabetic neurological complication: Secondary | ICD-10-CM | POA: Insufficient documentation

## 2011-12-09 DIAGNOSIS — E1065 Type 1 diabetes mellitus with hyperglycemia: Secondary | ICD-10-CM

## 2011-12-09 DIAGNOSIS — R072 Precordial pain: Secondary | ICD-10-CM

## 2011-12-09 MED ORDER — BUPROPION HCL ER (SR) 150 MG PO TB12: 150.0000 mg | ORAL_TABLET | Freq: Two times a day (BID) | ORAL | Status: DC

## 2011-12-09 NOTE — Progress Notes (Signed)
Echocardiogram performed.  

## 2011-12-09 NOTE — Telephone Encounter (Signed)
Called and spoke with pt and she stated that she was told to make an appt after she has the stress test done today. Will refill her wellbutrin for 1 month.Audelia Hives [place]

## 2012-02-19 ENCOUNTER — Encounter: Payer: Self-pay | Admitting: Family Medicine

## 2012-02-19 ENCOUNTER — Ambulatory Visit
Admission: RE | Admit: 2012-02-19 | Discharge: 2012-02-19 | Disposition: A | Discharge status: Home / Self Care | Payer: Medicaid Other | Source: Ambulatory Visit | Attending: Family Medicine | Admitting: Family Medicine

## 2012-02-19 ENCOUNTER — Ambulatory Visit (INDEPENDENT_AMBULATORY_CARE_PROVIDER_SITE_OTHER): Payer: Medicaid Other | Admitting: Family Medicine

## 2012-02-19 VITALS — BP 110/73 | HR 87 | Temp 98.2°F | Ht 70.0 in | Wt 180.0 lb

## 2012-02-19 DIAGNOSIS — E11628 Type 2 diabetes mellitus with other skin complications: Secondary | ICD-10-CM

## 2012-02-19 DIAGNOSIS — E1169 Type 2 diabetes mellitus with other specified complication: Secondary | ICD-10-CM

## 2012-02-19 DIAGNOSIS — E1065 Type 1 diabetes mellitus with hyperglycemia: Secondary | ICD-10-CM

## 2012-02-19 DIAGNOSIS — L089 Local infection of the skin and subcutaneous tissue, unspecified: Secondary | ICD-10-CM

## 2012-02-19 LAB — POCT GLYCOSYLATED HEMOGLOBIN (HGB A1C): Hemoglobin A1C: >14

## 2012-02-19 MED ORDER — SULFAMETHOXAZOLE-TRIMETHOPRIM 800-160 MG PO TABS: 2.0000 | ORAL_TABLET | Freq: Two times a day (BID) | ORAL | Status: DC

## 2012-02-19 MED ORDER — AMOXICILLIN-POT CLAVULANATE 875-125 MG PO TABS: 1.0000 | ORAL_TABLET | Freq: Two times a day (BID) | ORAL | Status: DC

## 2012-02-19 NOTE — Assessment & Plan Note (Signed)
Acute on chronic left great toe foot infection exacerbated by aggressive self care and poorly controlled diabetes.  Discussed high risk of complications due tot his with patient.  Will place on Bactrim and Augmentin, marked area of redness.  Will check xray to evaluate for osteomyelitis or radiopaque foreign body.    Patient will go straight to ER for worsening redness or pain over the weekend.  Will  Follow-up in clinic on Monday.

## 2012-02-19 NOTE — Progress Notes (Signed)
  Subjective:    Patient ID: [name], female    DOB: 1965-05-07, 46 y.o.   MRN: [id number]  HPIHere for evaluation of left great toe wound  Left great toe: reports chronic callous on bottom of great tow x months.  Has been using Listerine and a foot brush to remove callous.  Reports in the past 1-2 days, has noticed redness, pus drainage, and a "blood blister" on the bottom of that foot.  No fever, chills.    Diabetes: poorly controlled, worsening.  Now greater than 14% Lantus 12 each morning Novolog 12-14 with each meal  Fasting blood sugar 140's Does not check before meals 120 before bed No hypoglycemia  Review of Systems See HPI    Objective:   Physical Exam GEN: NAD  Left foot:  Decreased sensation throughout foot with monofilament.  Warm, appears well perfused. Great to with eythema of sole and across lateral edge of 1st mtp.callous noted on sole of great toe with a small puncture hole, not actively drainage, appears chronic.  Inferior to that is purulent draining approx 2 mm incision made by patient.  ?loose bulla , darkened skin across sole of most of 1st digit consistent with patient's description of self drained "blood blister"  Area of redness marked on patient's foot today       Assessment & Plan:

## 2012-02-19 NOTE — Assessment & Plan Note (Signed)
A1c>14.  Discussed need for improvement in diabetes control, discussed she is at high risk for complications from diabetes.  Will increase lantus from 12 to 15.  Gave her a glucose log and asked her to check cbg before meals and QHS.  Will bring back to office on Monday, will help [place] to safely increase insulin and figure out if missed doses vs if blood sugars are well cotnrolled when she takes insulin.

## 2012-02-19 NOTE — Patient Instructions (Addendum)
Your a1c is > 14 (off the scale)  Increase lantus to 16 units each morning  Check blood sugar before breakfast, before lunch,before dinner, and before bed  Bring log to all appointments  Take antibiotics for foot infection  If worsening redness or pain, go to ER  Follow-up in office on Monday

## 2012-02-23 ENCOUNTER — Ambulatory Visit: Payer: Medicaid Other | Admitting: Family Medicine

## 2012-02-26 ENCOUNTER — Encounter: Payer: Self-pay | Admitting: Family Medicine

## 2012-02-26 ENCOUNTER — Ambulatory Visit (INDEPENDENT_AMBULATORY_CARE_PROVIDER_SITE_OTHER): Payer: Medicaid Other | Admitting: Family Medicine

## 2012-02-26 VITALS — BP 156/80 | HR 85 | Ht 70.0 in | Wt 181.0 lb

## 2012-02-26 DIAGNOSIS — E1065 Type 1 diabetes mellitus with hyperglycemia: Secondary | ICD-10-CM

## 2012-02-26 DIAGNOSIS — IMO0002 Reserved for concepts with insufficient information to code with codable children: Secondary | ICD-10-CM

## 2012-02-26 DIAGNOSIS — E1169 Type 2 diabetes mellitus with other specified complication: Secondary | ICD-10-CM

## 2012-02-26 DIAGNOSIS — L089 Local infection of the skin and subcutaneous tissue, unspecified: Secondary | ICD-10-CM

## 2012-02-26 DIAGNOSIS — E11628 Type 2 diabetes mellitus with other skin complications: Secondary | ICD-10-CM

## 2012-02-26 MED ORDER — FLUCONAZOLE 150 MG PO TABS: 150.0000 mg | ORAL_TABLET | Freq: Once | ORAL | Status: DC

## 2012-02-26 MED ORDER — AMOXICILLIN-POT CLAVULANATE 875-125 MG PO TABS: 1.0000 | ORAL_TABLET | Freq: Two times a day (BID) | ORAL | Status: DC

## 2012-02-26 MED ORDER — SULFAMETHOXAZOLE-TRIMETHOPRIM 800-160 MG PO TABS: 2.0000 | ORAL_TABLET | Freq: Two times a day (BID) | ORAL | Status: DC

## 2012-02-26 NOTE — Assessment & Plan Note (Addendum)
Overall improving, discussed with patient my concern for poor wound healing and high risk for amputation.  She would like to follow-up in office in 1 week.  If not continuing to improve, discussed would likely refer to specialty care at that time.  Will extend antibiotics to full 2 week course.  I have put photo in note for comparison as I will not be available next week

## 2012-02-26 NOTE — Progress Notes (Signed)
Subjective:    Patient ID: [name], female    DOB: 1966-01-24, 46 y.o.   MRN: [id number]  HPI  Here for follow-up of right great toe infection 12/19; was seen and dx with bactrim and augmentin for moderate diabetic foot infection with peripheral neuropathy.  Xray at that time showed no signs of osteomyelitis or foreign body.  Late follow-up today, patient reports good compliance with meds, marked improvement in redness.  Area of blister has now come off.  DM:  Did not bring lots today. Increaed lantus to 22 units.  Reports fasting BS in 127-204 range Review of Systems See HPI  NO fever, chills    Objective:   Physical Exam GEN: NAD Right great toe:  Dead skin overlying cut off with sterile scissors.  Reveals eschar- unstagable.  Does not probe deeper        Assessment & Plan:

## 2012-02-26 NOTE — Assessment & Plan Note (Signed)
Patient reports today taking 22 untis of lantus daily with still elevated BS.  Will increase to 25 units, advised to bring glucose log and we will further titrate lantus and novolog at next visit

## 2012-02-26 NOTE — Patient Instructions (Addendum)
Foot is high risk for amputation  We discuss referral vs continued waiting.  Will follow-up in 1 week, if no improvement, will refer for further wound care/cleaning  Will extend antibiotics for full 2 weeks  Bring back sugar log to follow-up visit

## 2012-03-09 ENCOUNTER — Ambulatory Visit: Payer: Medicaid Other | Admitting: Family Medicine

## 2012-03-18 ENCOUNTER — Telehealth: Payer: Self-pay | Admitting: Family Medicine

## 2012-03-18 NOTE — Telephone Encounter (Signed)
Patient is calling to find out what she can do at home for her swollen foot.  She was last seen at the end of December and the problem is continuing.

## 2012-03-19 ENCOUNTER — Ambulatory Visit
Admission: RE | Admit: 2012-03-19 | Discharge: 2012-03-19 | Disposition: A | Discharge status: Home / Self Care | Payer: Medicaid Other | Source: Ambulatory Visit | Attending: Family Medicine | Admitting: Family Medicine

## 2012-03-19 ENCOUNTER — Telehealth: Payer: Self-pay | Admitting: Emergency Medicine

## 2012-03-19 ENCOUNTER — Ambulatory Visit (INDEPENDENT_AMBULATORY_CARE_PROVIDER_SITE_OTHER): Payer: Medicaid Other | Admitting: Emergency Medicine

## 2012-03-19 ENCOUNTER — Encounter: Payer: Self-pay | Admitting: Emergency Medicine

## 2012-03-19 VITALS — BP 166/82 | HR 90 | Ht 70.0 in | Wt 189.9 lb

## 2012-03-19 DIAGNOSIS — E11628 Type 2 diabetes mellitus with other skin complications: Secondary | ICD-10-CM

## 2012-03-19 DIAGNOSIS — E1065 Type 1 diabetes mellitus with hyperglycemia: Secondary | ICD-10-CM

## 2012-03-19 DIAGNOSIS — E1169 Type 2 diabetes mellitus with other specified complication: Secondary | ICD-10-CM

## 2012-03-19 DIAGNOSIS — L97509 Non-pressure chronic ulcer of other part of unspecified foot with unspecified severity: Secondary | ICD-10-CM

## 2012-03-19 DIAGNOSIS — I10 Essential (primary) hypertension: Secondary | ICD-10-CM

## 2012-03-19 DIAGNOSIS — E11621 Type 2 diabetes mellitus with foot ulcer: Secondary | ICD-10-CM

## 2012-03-19 DIAGNOSIS — L089 Local infection of the skin and subcutaneous tissue, unspecified: Secondary | ICD-10-CM

## 2012-03-19 NOTE — Assessment & Plan Note (Signed)
Completed 2 week antibiotic course.  Foot is slowly improving.  Seen by myself and Dr. Doreene Nest who saw it 2-3 weeks ago.  Continue epsom salt soaks and antibiotic ointment daily.  No need for further oral antibiotics.  Will check another x-ray given the new swelling over dorsum of foot.  I will contact her with the results of the x-ray.  Follow up in 1-2 weeks to recheck the foot.  Follow up in 2 weeks with PCP to discuss blood sugar and blood pressure control.

## 2012-03-19 NOTE — Telephone Encounter (Signed)
Called and discussed x-ray results with patient.  We will refer to orthopedic for further evaluation and possible MRI if needed.  Patient will continue with epsom salt soaks and antibiotic ointment as discussed earlier today.

## 2012-03-19 NOTE — Telephone Encounter (Signed)
Patient is calling back because she didn't hear back yesterday.  She has an infection in her toe and she is a diabetic.

## 2012-03-19 NOTE — Assessment & Plan Note (Signed)
Strongly recommended f/u with PCP.

## 2012-03-19 NOTE — Progress Notes (Signed)
  Subjective:    Patient ID: [name], female    DOB: [birth year], 47 y.o.   MRN: [id number]  HPI [name] is here for f/u of left diabetic foot ulcer.  She reports that she completed her 2 weeks of antibiotics about 2 weeks ago.  Continues to use daily epsom salt soaks and antibiotic ointment.  Leaves it open to the air at night. Denies any pain or drainage.  Has noticed some increased swelling over the dorsum of her foot to the ankle.  No increase is erythema of the foot.  Denies any pain, but reports peripheral neuropathy from diabetes that gabapentin has not helped with.  Overall, she states the foot is improving.  I have reviewed and updated the following as appropriate: allergies and current medications PMHx: uncontrolled diabetes  SHx: current smoker  Review of Systems See HPI    Objective:   Physical Exam BP 166/82  Pulse 90  Ht 5\' 10"  (1.778 m)  Wt 189 lb 14.4 oz (86.138 kg)  BMI 27.25 kg/m2 Gen: alert, cooperative, NAD Left foot: moderate swelling over dorsum of foot.  Great toe mildly erythematous, large shallow ulceration on plantar great toe without erythema or drainage.      Assessment & Plan:

## 2012-03-19 NOTE — Assessment & Plan Note (Signed)
Elevated today.  Strongly recommended f/u with PCP for medication adjustment.

## 2012-03-19 NOTE — Telephone Encounter (Signed)
Returned pt's call. Should she call back please make appt for her to come in to be seen.[name]

## 2012-03-24 ENCOUNTER — Other Ambulatory Visit: Payer: Self-pay | Admitting: Orthopedic Surgery

## 2012-03-24 DIAGNOSIS — M79672 Pain in left foot: Secondary | ICD-10-CM

## 2012-03-25 ENCOUNTER — Ambulatory Visit: Payer: Medicaid Other | Admitting: Family Medicine

## 2012-03-31 ENCOUNTER — Other Ambulatory Visit: Payer: Medicaid Other

## 2012-04-02 ENCOUNTER — Ambulatory Visit
Admission: RE | Admit: 2012-04-02 | Discharge: 2012-04-02 | Disposition: A | Discharge status: Home / Self Care | Payer: Medicaid Other | Source: Ambulatory Visit | Attending: Orthopedic Surgery | Admitting: Orthopedic Surgery

## 2012-04-02 DIAGNOSIS — M79672 Pain in left foot: Secondary | ICD-10-CM

## 2012-04-02 MED ORDER — GADOBENATE DIMEGLUMINE 529 MG/ML IV SOLN
18.0000 mL | Freq: Once | INTRAVENOUS | Status: AC | PRN
  Administered 2012-04-02: 18 mL via INTRAVENOUS

## 2012-04-05 ENCOUNTER — Telehealth: Payer: Self-pay | Admitting: Family Medicine

## 2012-04-05 NOTE — Telephone Encounter (Signed)
Patient is calling to speak to Dr. Doreene Nest about the Xrays and MRI that she had done.

## 2012-04-05 NOTE — Telephone Encounter (Signed)
Spoke with patient and gave her below message. She said she went to Dr. Vassie Moselle today and all that they told her was her toe needed to be "cut off". I explained to her she would need to get in touch with Dr. Randel Pigg office for records and further explaination

## 2012-04-05 NOTE — Telephone Encounter (Signed)
I recommend patient call Dr. Marlou Sa (her orthopedist) as he ordered the MRI and can best discuss what this means for her and his plans for further treatment.

## 2012-04-07 NOTE — Telephone Encounter (Signed)
Pt is asking to speak with Dr Doreene Nest

## 2012-04-08 ENCOUNTER — Other Ambulatory Visit: Payer: Medicaid Other

## 2012-04-08 NOTE — Telephone Encounter (Signed)
Patient concerned about diagnosis of bone infection and orthopedists recommendation for amputation.  I discussed that radiologists reading of osteomyelitis is consistent with her doctor's explanation of a bone infection.  As she has reservations about surgery, I encouraged her to schedule appt with orthopedist to discuss surgery and other possible options in more detail.  I invited her to return for help with diabetes control, and explained that this was very important  In the wound healing process.

## 2012-05-05 ENCOUNTER — Other Ambulatory Visit: Payer: Self-pay | Admitting: Family Medicine

## 2012-05-12 ENCOUNTER — Ambulatory Visit (INDEPENDENT_AMBULATORY_CARE_PROVIDER_SITE_OTHER): Payer: Medicaid Other | Admitting: Family Medicine

## 2012-05-12 VITALS — BP 155/84 | HR 87 | Ht 70.0 in | Wt 192.0 lb

## 2012-05-12 DIAGNOSIS — L989 Disorder of the skin and subcutaneous tissue, unspecified: Secondary | ICD-10-CM

## 2012-05-12 DIAGNOSIS — L821 Other seborrheic keratosis: Secondary | ICD-10-CM

## 2012-05-12 NOTE — Patient Instructions (Signed)
We will send you to [facility] for the lesion on your face.  On your back, this is called seborrheic keratosis, or seb k.  This is a benign condition.    It was good to see you today

## 2012-05-12 NOTE — Progress Notes (Signed)
[name] is a 47 y.o. female who presents to [facility] today with complaints of "brown spot on face and back:"  1.  Skin check:  Patient would like to have lesion on back and face checked.  Back lesion has been present for a couple of months, unsure exactly how long.  Not at line of bra strap, not irritating her skin.  Spot on face has been there for 4 months almost exactly.  Not worried about it at first, but feels it is starting to "change."  When pressed on this, she feels it has not grown in size but is becoming a lighter tan in color.  She scratched it by accident a few days ago and it started bleeding.    No personal or family history of skin cancer.  Does have history of sun exposure.    The following portions of the patient's history were reviewed and updated as appropriate: allergies, current medications, past medical history, family and social history, and problem list.  Patient is a nonsmoker.    Past Medical History  Diagnosis Date  . Diabetes type 1, uncontrolled   . Chest tightness   . Urinary frequency   . Nausea and vomiting in adult   . Diabetic neuropathy   . Essential hypertension   . Yeast vaginitis    Past Surgical History  Procedure Laterality Date  . Endometrial ablation      Medications reviewed. Current Outpatient Prescriptions  Medication Sig Dispense Refill  . ACCU-CHEK AVIVA PLUS test strip USE TO CHECK BLOOD SUGAR BEFORE MEALS AND AT BEDTIME  100 each  12  . Blood Glucose Monitoring Suppl (ONE TOUCH ULTRA SYSTEM KIT) W/DEVICE KIT 1 kit by Does not apply route once.  1 each  0  . buPROPion (WELLBUTRIN SR) 150 MG 12 hr tablet Take 1 tablet (150 mg total) by mouth 2 (two) times daily.  60 tablet  0  . dicyclomine (BENTYL) 20 MG tablet Take 1 tablet (20 mg total) by mouth every 6 (six) hours.  120 tablet  5  . insulin aspart (NOVOLOG FLEXPEN) 100 UNIT/ML injection Inject 10 Units into the skin 3 (three) times daily before meals.  5 pen  prn  . insulin  glargine (LANTUS SOLOSTAR) 100 UNIT/ML injection Inject 17 Units into the skin daily.  5 pen  PRN  . lisinopril (PRINIVIL,ZESTRIL) 20 MG tablet Take 1 tablet (20 mg total) by mouth daily.  90 tablet  6  . metoCLOPramide (REGLAN) 5 MG tablet Take 1 tablet (5 mg total) by mouth 4 (four) times daily. X 15 days  60 tablet  0  . nortriptyline (PAMELOR) 50 MG capsule Take 1 capsule (50 mg total) by mouth at bedtime.  30 capsule  11  . omeprazole (PRILOSEC OTC) 20 MG tablet Take 1 tablet (20 mg total) by mouth daily.  90 tablet  6   No current facility-administered medications for this visit.    ROS as above otherwise neg.    Physical Exam:  BP 155/84  Pulse 87  Ht 5\' 10"  (1.778 m)  Wt 192 lb (87.091 kg)  BMI 27.55 kg/m2 Gen:  Alert, cooperative patient who appears stated age in no acute distress.  Vital signs reviewed. Skin:  0.5 cm seborrheic keratosis noted on Right upper back.  Not inflamed, not irritated.  1 cm lesion noted Left pre-auricular area.  This flakes easily with some red, erythematous skin located beneath.  No central umbilication.    No results found  for this or any previous visit (from the past 47 hour(s)).

## 2012-05-12 NOTE — Assessment & Plan Note (Signed)
Non-inflamed.   Pt elects to leave this alone. Discussed removal methods if she it becomes irritated or she wants this removed at future date.

## 2012-05-12 NOTE — Assessment & Plan Note (Signed)
Possibly also a seb keratosis, but not typical appearing.  Will refer to derm for second opinion and full skin check secondary to prolonged history of sun exposure.

## 2012-06-10 ENCOUNTER — Other Ambulatory Visit: Payer: Self-pay | Admitting: Orthopedic Surgery

## 2012-06-11 ENCOUNTER — Encounter (HOSPITAL_COMMUNITY): Payer: Self-pay

## 2012-06-11 ENCOUNTER — Other Ambulatory Visit: Payer: Self-pay | Admitting: Family Medicine

## 2012-06-14 ENCOUNTER — Ambulatory Visit (HOSPITAL_COMMUNITY)
Admission: RE | Admit: 2012-06-14 | Discharge: 2012-06-14 | Disposition: A | Discharge status: Home / Self Care | Payer: Medicaid Other | Source: Ambulatory Visit | Attending: Orthopedic Surgery | Admitting: Orthopedic Surgery

## 2012-06-14 ENCOUNTER — Encounter (HOSPITAL_COMMUNITY)
Admission: RE | Admit: 2012-06-14 | Discharge: 2012-06-14 | Disposition: A | Discharge status: Home / Self Care | Payer: Medicaid Other | Source: Ambulatory Visit | Attending: Orthopedic Surgery | Admitting: Orthopedic Surgery

## 2012-06-14 ENCOUNTER — Encounter (HOSPITAL_COMMUNITY): Payer: Self-pay

## 2012-06-14 HISTORY — DX: Gastro-esophageal reflux disease without esophagitis: K21.9

## 2012-06-14 LAB — BASIC METABOLIC PANEL
BUN: 14 mg/dL (ref 6–23)
CO2: 30 mEq/L (ref 19–32)
Calcium: 9.5 mg/dL (ref 8.4–10.5)
Chloride: 96 mEq/L (ref 96–112)
Creatinine, Ser: 0.8 mg/dL (ref 0.50–1.10)
GFR calc Af Amer: >90 mL/min (ref >90)
GFR calc non Af Amer: 88 mL/min — ABNORMAL LOW (ref >90)
Glucose, Bld: 233 mg/dL — ABNORMAL HIGH (ref 70–99)
Potassium: 3.9 mEq/L (ref 3.5–5.1)
Sodium: 134 mEq/L — ABNORMAL LOW (ref 135–145)

## 2012-06-14 LAB — CBC
HCT: 39.7 % (ref 36.0–46.0)
Hemoglobin: 13.7 g/dL (ref 12.0–15.0)
MCH: 28.9 pg (ref 26.0–34.0)
MCHC: 34.5 g/dL (ref 30.0–36.0)
MCV: 83.8 fL (ref 78.0–100.0)
Platelets: 401 10*3/uL — ABNORMAL HIGH (ref 150–400)
RBC: 4.74 MIL/uL (ref 3.87–5.11)
RDW: 12.8 % (ref 11.5–15.5)
WBC: 16.2 10*3/uL — ABNORMAL HIGH (ref 4.0–10.5)

## 2012-06-14 LAB — SURGICAL PCR SCREEN
MRSA, PCR: NEGATIVE
Staphylococcus aureus: NEGATIVE

## 2012-06-14 LAB — HCG, SERUM, QUALITATIVE: Preg, Serum: NEGATIVE

## 2012-06-14 MED ORDER — CHLORHEXIDINE GLUCONATE 4 % EX LIQD: 60.0000 mL | Freq: Once | CUTANEOUS | Status: DC

## 2012-06-14 MED ORDER — CEFAZOLIN SODIUM-DEXTROSE 2-3 GM-% IV SOLR
2.0000 g | INTRAVENOUS | Status: DC
  Filled 2012-06-14: qty 50

## 2012-06-14 NOTE — Pre-Procedure Instructions (Signed)
AINE BOSTIAN  06/14/2012   Your procedure is scheduled on:  Tuesday, April 15th.  Report to [facility] at 11:15AM.  Call this number if you have problems the morning of surgery: [phone]   Remember:   Do not eat food or drink liquids after midnight.   Take these medicines the morning of surgery with A SIP OF WATER:  omeprazole (PRILOSEC)   Do not wear jewelry, make-up or nail polish.  Do not wear lotions, powders, or perfumes. You may wear deodorant.  Do not shave 48 hours prior to surgery.   Do not bring valuables to the hospital.  Contacts, dentures or bridgework may not be worn into surgery.  Leave suitcase in the car. After surgery it may be brought to your room.  For patients admitted to the hospital, checkout time is 11:00 AM the day of  discharge.   Patients discharged the day of surgery will not be allowed to drive  home.  Name and phone number of your driver: -   Special Instruction:Shower with CHG wash (Bactoshield) tonight and again in the am prior to arriving to hospital.   Please read over the following fact sheets that you were given: Pain Booklet, Coughing and Deep Breathing and Surgical Site Infection Prevention

## 2012-06-15 ENCOUNTER — Encounter (HOSPITAL_COMMUNITY): Payer: Self-pay | Admitting: Certified Registered Nurse Anesthetist

## 2012-06-15 ENCOUNTER — Encounter (HOSPITAL_COMMUNITY)
Admission: RE | Disposition: A | Discharge status: Home / Self Care | Payer: Self-pay | Source: Ambulatory Visit | Attending: Orthopedic Surgery

## 2012-06-15 ENCOUNTER — Ambulatory Visit (HOSPITAL_COMMUNITY): Payer: Medicaid Other | Admitting: Certified Registered Nurse Anesthetist

## 2012-06-15 ENCOUNTER — Ambulatory Visit (HOSPITAL_COMMUNITY)
Admission: RE | Admit: 2012-06-15 | Discharge: 2012-06-15 | Disposition: A | Discharge status: Home / Self Care | Payer: Medicaid Other | Source: Ambulatory Visit | Attending: Orthopedic Surgery | Admitting: Orthopedic Surgery

## 2012-06-15 DIAGNOSIS — E1142 Type 2 diabetes mellitus with diabetic polyneuropathy: Secondary | ICD-10-CM | POA: Insufficient documentation

## 2012-06-15 DIAGNOSIS — L089 Local infection of the skin and subcutaneous tissue, unspecified: Secondary | ICD-10-CM

## 2012-06-15 DIAGNOSIS — T8740 Infection of amputation stump, unspecified extremity: Secondary | ICD-10-CM | POA: Insufficient documentation

## 2012-06-15 DIAGNOSIS — K219 Gastro-esophageal reflux disease without esophagitis: Secondary | ICD-10-CM | POA: Insufficient documentation

## 2012-06-15 DIAGNOSIS — M8708 Idiopathic aseptic necrosis of bone, other site: Secondary | ICD-10-CM | POA: Insufficient documentation

## 2012-06-15 DIAGNOSIS — E1149 Type 2 diabetes mellitus with other diabetic neurological complication: Secondary | ICD-10-CM | POA: Insufficient documentation

## 2012-06-15 DIAGNOSIS — I10 Essential (primary) hypertension: Secondary | ICD-10-CM | POA: Insufficient documentation

## 2012-06-15 DIAGNOSIS — F17200 Nicotine dependence, unspecified, uncomplicated: Secondary | ICD-10-CM | POA: Insufficient documentation

## 2012-06-15 DIAGNOSIS — Y835 Amputation of limb(s) as the cause of abnormal reaction of the patient, or of later complication, without mention of misadventure at the time of the procedure: Secondary | ICD-10-CM | POA: Insufficient documentation

## 2012-06-15 DIAGNOSIS — Z91040 Latex allergy status: Secondary | ICD-10-CM | POA: Insufficient documentation

## 2012-06-15 HISTORY — PX: AMPUTATION: SHX166

## 2012-06-15 LAB — GLUCOSE, CAPILLARY
Glucose-Capillary: 272 mg/dL — ABNORMAL HIGH (ref 70–99)
Glucose-Capillary: 225 mg/dL — ABNORMAL HIGH (ref 70–99)
Glucose-Capillary: 225 mg/dL — ABNORMAL HIGH (ref 70–99)

## 2012-06-15 SURGERY — AMPUTATION DIGIT
Anesthesia: General | Site: Toe | Laterality: Left | Wound class: Dirty or Infected

## 2012-06-15 MED ORDER — LACTATED RINGERS IV SOLN
INTRAVENOUS | Status: DC
  Administered 2012-06-15: 13:00:00 via INTRAVENOUS

## 2012-06-15 MED ORDER — PROPOFOL INFUSION 10 MG/ML OPTIME
INTRAVENOUS | Status: DC | PRN
  Administered 2012-06-15: 100 ug/kg/min via INTRAVENOUS

## 2012-06-15 MED ORDER — FENTANYL CITRATE 0.05 MG/ML IJ SOLN
INTRAMUSCULAR | Status: DC | PRN
  Administered 2012-06-15 (×2): 50 ug via INTRAVENOUS

## 2012-06-15 MED ORDER — 0.9 % SODIUM CHLORIDE (POUR BTL) OPTIME
TOPICAL | Status: DC | PRN
  Administered 2012-06-15 (×2): 1000 mL

## 2012-06-15 MED ORDER — HYDROCODONE-ACETAMINOPHEN 5-325 MG PO TABS: 1.0000 | ORAL_TABLET | Freq: Four times a day (QID) | ORAL | Status: DC | PRN

## 2012-06-15 MED ORDER — FENTANYL CITRATE 0.05 MG/ML IJ SOLN
INTRAMUSCULAR | Status: AC
  Administered 2012-06-15: 100 ug
  Filled 2012-06-15: qty 2

## 2012-06-15 MED ORDER — CHLORHEXIDINE GLUCONATE 4 % EX LIQD: 60.0000 mL | Freq: Once | CUTANEOUS | Status: DC

## 2012-06-15 MED ORDER — SODIUM CHLORIDE 0.9 % IV SOLN
1000.0000 mg | INTRAVENOUS | Status: DC | PRN
  Administered 2012-06-15: 1000 mg via INTRAVENOUS

## 2012-06-15 MED ORDER — FENTANYL CITRATE 0.05 MG/ML IJ SOLN: 50.0000 ug | INTRAMUSCULAR | Status: DC | PRN

## 2012-06-15 MED ORDER — DOXYCYCLINE HYCLATE 50 MG PO CAPS: 100.0000 mg | ORAL_CAPSULE | Freq: Two times a day (BID) | ORAL | Status: DC

## 2012-06-15 MED ORDER — VANCOMYCIN HCL IN DEXTROSE 1-5 GM/200ML-% IV SOLN
INTRAVENOUS | Status: AC
  Filled 2012-06-15: qty 200

## 2012-06-15 MED ORDER — LACTATED RINGERS IV SOLN
INTRAVENOUS | Status: DC | PRN
  Administered 2012-06-15 (×2): via INTRAVENOUS

## 2012-06-15 MED ORDER — ONDANSETRON HCL 4 MG/2ML IJ SOLN
INTRAMUSCULAR | Status: DC | PRN
  Administered 2012-06-15: 4 mg via INTRAVENOUS

## 2012-06-15 SURGICAL SUPPLY — 62 items
APL SKNCLS STERI-STRIP NONHPOA (GAUZE/BANDAGES/DRESSINGS) ×1
BANDAGE CONFORM 2  STR LF (GAUZE/BANDAGES/DRESSINGS) IMPLANT
BANDAGE CONFORM 3  STR LF (GAUZE/BANDAGES/DRESSINGS) IMPLANT
BANDAGE ELASTIC 4 VELCRO ST LF (GAUZE/BANDAGES/DRESSINGS) IMPLANT
BENZOIN TINCTURE PRP APPL 2/3 (GAUZE/BANDAGES/DRESSINGS) ×2 IMPLANT
BLADE SURG ROTATE 9660 (MISCELLANEOUS) IMPLANT
BNDG CMPR 9X4 STRL LF SNTH (GAUZE/BANDAGES/DRESSINGS)
BNDG CMPR MD 5X2 ELC HKLP STRL (GAUZE/BANDAGES/DRESSINGS) ×1
BNDG COHESIVE 1X5 TAN STRL LF (GAUZE/BANDAGES/DRESSINGS) IMPLANT
BNDG ELASTIC 2 VLCR STRL LF (GAUZE/BANDAGES/DRESSINGS) ×1 IMPLANT
BNDG ESMARK 4X9 LF (GAUZE/BANDAGES/DRESSINGS) IMPLANT
CLOTH BEACON ORANGE TIMEOUT ST (SAFETY) ×2 IMPLANT
CORDS BIPOLAR (ELECTRODE) ×2 IMPLANT
COVER SURGICAL LIGHT HANDLE (MISCELLANEOUS) ×2 IMPLANT
CUFF TOURNIQUET SINGLE 18IN (TOURNIQUET CUFF) IMPLANT
CUFF TOURNIQUET SINGLE 24IN (TOURNIQUET CUFF) IMPLANT
CUFF TOURNIQUET SINGLE 34IN LL (TOURNIQUET CUFF) IMPLANT
CUFF TOURNIQUET SINGLE 44IN (TOURNIQUET CUFF) IMPLANT
DRAPE INCISE IOBAN 66X45 STRL (DRAPES) IMPLANT
DRAPE OEC MINIVIEW 54X84 (DRAPES) IMPLANT
DRAPE U-SHAPE 47X51 STRL (DRAPES) ×1 IMPLANT
DRSG EMULSION OIL 3X3 NADH (GAUZE/BANDAGES/DRESSINGS) IMPLANT
DRSG PAD ABDOMINAL 8X10 ST (GAUZE/BANDAGES/DRESSINGS) ×1 IMPLANT
DURAPREP 26ML APPLICATOR (WOUND CARE) ×2 IMPLANT
ELECT REM PT RETURN 9FT ADLT (ELECTROSURGICAL)
ELECTRODE REM PT RTRN 9FT ADLT (ELECTROSURGICAL) IMPLANT
GAUZE SPONGE 2X2 8PLY STRL LF (GAUZE/BANDAGES/DRESSINGS) IMPLANT
GAUZE XEROFORM 1X8 LF (GAUZE/BANDAGES/DRESSINGS) ×1 IMPLANT
GLOVE BIOGEL PI IND STRL 8 (GLOVE) ×1 IMPLANT
GLOVE BIOGEL PI INDICATOR 8 (GLOVE) ×1
GLOVE SURG ORTHO 8.0 STRL STRW (GLOVE) ×1 IMPLANT
GOWN PREVENTION PLUS LG XLONG (DISPOSABLE) IMPLANT
GOWN STRL NON-REIN LRG LVL3 (GOWN DISPOSABLE) ×4 IMPLANT
KIT BASIN OR (CUSTOM PROCEDURE TRAY) ×2 IMPLANT
KIT ROOM TURNOVER OR (KITS) ×2 IMPLANT
MANIFOLD NEPTUNE II (INSTRUMENTS) ×2 IMPLANT
NS IRRIG 1000ML POUR BTL (IV SOLUTION) ×2 IMPLANT
PACK ORTHO EXTREMITY (CUSTOM PROCEDURE TRAY) ×2 IMPLANT
PAD ARMBOARD 7.5X6 YLW CONV (MISCELLANEOUS) ×4 IMPLANT
PAD CAST 3X4 CTTN HI CHSV (CAST SUPPLIES) IMPLANT
PADDING CAST COTTON 3X4 STRL (CAST SUPPLIES) ×2
SPECIMEN JAR SMALL (MISCELLANEOUS) ×2 IMPLANT
SPONGE GAUZE 2X2 STER 10/PKG (GAUZE/BANDAGES/DRESSINGS)
SPONGE GAUZE 4X4 12PLY (GAUZE/BANDAGES/DRESSINGS) ×1 IMPLANT
STRIP CLOSURE SKIN 1/2X4 (GAUZE/BANDAGES/DRESSINGS) ×2 IMPLANT
SUCTION FRAZIER TIP 10 FR DISP (SUCTIONS) ×2 IMPLANT
SUT ETHIBOND 4 0 TF (SUTURE) IMPLANT
SUT ETHIBOND 5 0 P 3 (SUTURE)
SUT ETHILON 3 0 PS 1 (SUTURE) ×2 IMPLANT
SUT ETHILON 4 0 P 3 18 (SUTURE) IMPLANT
SUT ETHILON 5 0 P 3 18 (SUTURE)
SUT NYLON ETHILON 5-0 P-3 1X18 (SUTURE) IMPLANT
SUT POLY ETHIBOND 5-0 P-3 1X18 (SUTURE) IMPLANT
SUT PROLENE 4 0 P 3 18 (SUTURE) IMPLANT
SUT SILK 4 0 PS 2 (SUTURE) IMPLANT
SUT VIC AB 3-0 FS2 27 (SUTURE) IMPLANT
SWAB COLLECTION DEVICE MRSA (MISCELLANEOUS) ×1 IMPLANT
TOWEL OR 17X24 6PK STRL BLUE (TOWEL DISPOSABLE) ×2 IMPLANT
TOWEL OR 17X26 10 PK STRL BLUE (TOWEL DISPOSABLE) ×2 IMPLANT
TUBE ANAEROBIC SPECIMEN COL (MISCELLANEOUS) ×1 IMPLANT
TUBE CONNECTING 12X1/4 (SUCTIONS) ×2 IMPLANT
WATER STERILE IRR 1000ML POUR (IV SOLUTION) ×2 IMPLANT

## 2012-06-15 NOTE — Anesthesia Postprocedure Evaluation (Signed)
Anesthesia Post Note  Patient: [name]  Procedure(s) Performed: Procedure(s) (LRB): AMPUTATION DIGIT (Left)  Anesthesia type: MAC  Patient location: PACU  Post pain: Pain level controlled and Adequate analgesia  Post assessment: Post-op Vital signs reviewed, Patient's Cardiovascular Status Stable and Respiratory Function Stable  Last Vitals:  Filed Vitals:   06/15/12 1553  BP: 139/77  Pulse: 72  Temp: 36.7 C  Resp: 18    Post vital signs: Reviewed and stable  Level of consciousness: awake, alert  and oriented  Complications: No apparent anesthesia complications

## 2012-06-15 NOTE — Preoperative (Signed)
Beta Blockers   Reason not to administer Beta Blockers:Not Applicable 

## 2012-06-15 NOTE — H&P (Signed)
[name] is an 47 y.o. female.   Chief Complaint: left toe infection HPI: [name] is a 47 year old female with diabetes who underwent IP left great toe amputation approximately 4 weeks to get. She develops in the drainage from the end of the incision and erythema in the proximal foot approximately week ago. This is issued with by mouth antibiotics but that has not had resolution of symptoms. She presents now for further revision amputation of her left great toe.  Past Medical History  Diagnosis Date  . Diabetes type 1, uncontrolled   . Chest tightness   . Urinary frequency   . Nausea and vomiting in adult   . Diabetic neuropathy   . Essential hypertension   . Yeast vaginitis   . GERD (gastroesophageal reflux disease)     Past Surgical History  Procedure Laterality Date  . Endometrial ablation      Family History  Problem Relation Age of Onset  . Diabetes Mother   . Heart attack Mother   . Diabetes Father   . Heart attack Father    Social History:  reports that she has been smoking Cigarettes.  She has a 11 pack-year smoking history. She has never used smokeless tobacco. She reports that  drinks alcohol. She reports that she does not use illicit drugs.  Allergies:  Allergies  Allergen Reactions  . Latex Rash    No prescriptions prior to admission    Results for orders placed during the hospital encounter of 06/14/12 (from the past 48 hour(s))  BASIC METABOLIC PANEL     Status: Abnormal   Collection Time    06/14/12  1:00 PM      Result Value Range   Sodium 134 (*) 135 - 145 mEq/L   Potassium 3.9  3.5 - 5.1 mEq/L   Chloride 96  96 - 112 mEq/L   CO2 30  19 - 32 mEq/L   Glucose, Bld 233 (*) 70 - 99 mg/dL   BUN 14  6 - 23 mg/dL   Creatinine, Ser 0.80  0.50 - 1.10 mg/dL   Calcium 9.5  8.4 - 10.5 mg/dL   GFR calc non Af Amer 88 (*) >90 mL/min   GFR calc Af Amer >90  >90 mL/min   Comment:            The eGFR has been calculated     using the CKD EPI  equation.     This calculation has not been     validated in all clinical     situations.     eGFR's persistently     <90 mL/min signify     possible Chronic Kidney Disease.  CBC     Status: Abnormal   Collection Time    06/14/12  1:00 PM      Result Value Range   WBC 16.2 (*) 4.0 - 10.5 K/uL   RBC 4.74  3.87 - 5.11 MIL/uL   Hemoglobin 13.7  12.0 - 15.0 g/dL   HCT 39.7  36.0 - 46.0 %   MCV 83.8  78.0 - 100.0 fL   MCH 28.9  26.0 - 34.0 pg   MCHC 34.5  30.0 - 36.0 g/dL   RDW 12.8  11.5 - 15.5 %   Platelets 401 (*) 150 - 400 K/uL  HCG, SERUM, QUALITATIVE     Status: None   Collection Time    06/14/12  1:00 PM      Result Value Range   Preg,  Serum NEGATIVE  NEGATIVE   Comment:            THE SENSITIVITY OF THIS     METHODOLOGY IS >10 mIU/mL.  SURGICAL PCR SCREEN     Status: None   Collection Time    06/14/12  1:25 PM      Result Value Range   MRSA, PCR NEGATIVE  NEGATIVE   Staphylococcus aureus NEGATIVE  NEGATIVE   Comment:            The Xpert SA Assay (FDA     approved for NASAL specimens     in patients over 51 years of age),     is one component of     a comprehensive surveillance     program.  Test performance has     been validated by [employer] for patients greater     than or equal to 12 year old.     It is not intended     to diagnose infection nor to     guide or monitor treatment.   Dg Chest 2 View  06/14/2012  *RADIOLOGY REPORT*  Clinical Data: 47 year old female for great toe amputation. Hypertension and diabetes.  CHEST - 2 VIEW  Comparison: CT abdomen 05/07/2005.  Chest radiograph 10/24/2003.  Findings: Cardiac size at the upper limits of normal. Other mediastinal contours are within normal limits.  Visualized tracheal air column is within normal limits.  No pneumothorax, pulmonary edema, pleural effusion or acute pulmonary opacity. No acute osseous abnormality identified.  IMPRESSION: No acute cardiopulmonary abnormality.   Original Report  Authenticated By: Roselyn Reef, M.D.     Review of Systems  Constitutional: Negative.   HENT: Negative.   Eyes: Negative.   Respiratory: Negative.   Cardiovascular: Negative.   Gastrointestinal: Negative.   Genitourinary: Negative.   Musculoskeletal: Positive for joint pain.  Skin: Negative.   Neurological: Negative.   Endo/Heme/Allergies: Negative.   Psychiatric/Behavioral: Negative.     There were no vitals taken for this visit. Physical Exam  Constitutional: She appears well-developed.  HENT:  Head: Normocephalic.  Eyes: Pupils are equal, round, and reactive to light.  Neck: Normal range of motion.  Cardiovascular: Normal rate.   Respiratory: Effort normal.  Neurological: She is alert.  Skin: Skin is warm.  Psychiatric: She has a normal mood and affect.   left great toe demonstrates a healed incision but with sinus tract at the distal tip. There is some erythema around the toe itself. Some erythema also around the medial portion of the foot. No areas of definite fluctuance but with the treatment purulent drainage can be expressed from the sinus tract in the distal aspect of the toe amputation. Pedal pulses are palpable sensation diminished on the dorsum plantar aspect of the left foot compartments are soft  Assessment/Plan Impression is recurrent infection left great toe status post IP amputation. Plan vision amputation to the MTP joints with cultures obtained. Risk and benefits discussed with patient we will and to infection nerve vessel damage persistent infection which could require more extensive surgery all questions answered.  Clif Serio SCOTT 06/15/2012, 7:28 AM

## 2012-06-15 NOTE — Brief Op Note (Signed)
06/15/2012  3:06 PM  PATIENT:  [name]  47 y.o. female  PRE-OPERATIVE DIAGNOSIS:  Left Great Toe Infection  POST-OPERATIVE DIAGNOSIS:  Left Great Toe Infection  PROCEDURE:  Procedure(s): AMPUTATION DIGIT through mtp joint  SURGEON:  Surgeon(s): Meredith Pel, MD  ASSISTANT:   ANESTHESIA:   regional  EBL: 2 ml    Total I/O In: 1000 [I.V.:1000] Out: -   BLOOD ADMINISTERED: none  DRAINS: none   LOCAL MEDICATIONS USED:  none  SPECIMEN:  No Specimen  COUNTS:  YES  TOURNIQUET:   Total Tourniquet Time Documented: area (Left) - 8 minutes Total: area (Left) - 8 minutes   DICTATION: .Other Dictation: Dictation Number [id number]  PLAN OF CARE: Discharge to home after PACU  PATIENT DISPOSITION:  PACU - hemodynamically stable

## 2012-06-15 NOTE — Anesthesia Procedure Notes (Addendum)
Anesthesia Regional Block:  Ankle block  Pre-Anesthetic Checklist: ,, timeout performed, Correct Patient, Correct Site, Correct Laterality, Correct Procedure, Correct Position, site marked, Risks and benefits discussed,  Surgical consent,  Pre-op evaluation,  At surgeon's request and post-op pain management  Laterality: Left  Prep: Maximum Sterile Barrier Precautions used, chloraprep and alcohol swabs       Needles:  Injection technique: Single-shot      Needle Gauge: 25 and 25 G    Additional Needles: Ankle block Narrative:  Start time: 06/15/2012 1:30 PM End time: 06/15/2012 1:35 PM Injection made incrementally with aspirations every 5 mL.  Performed by: Personally  Anesthesiologist: Sharolyn Douglas MD  Additional Notes: 30cc ( 15cc 2% Lidocaine and 15cc 0.5% Marcaine w/o epi ) Ant and Post Tibial Nerves.w/o difficulty. GES  Ankle block Procedure Name: MAC Date/Time: 06/15/2012 1:59 PM Performed by: Ned Grace Pre-anesthesia Checklist: Patient identified, Timeout performed, Emergency Drugs available, Suction available and Patient being monitored Patient Re-evaluated:Patient Re-evaluated prior to inductionOxygen Delivery Method: Simple face mask Preoxygenation: Pre-oxygenation with 100% oxygen Intubation Type: IV induction Ventilation: Oral airway inserted - appropriate to patient size

## 2012-06-15 NOTE — Transfer of Care (Signed)
Immediate Anesthesia Transfer of Care Note  Patient: [name]  Procedure(s) Performed: Procedure(s) with comments: AMPUTATION DIGIT (Left) - Left great toe revision amputation  Patient Location: PACU  Anesthesia Type:MAC  Level of Consciousness: awake, alert , oriented and patient cooperative  Airway & Oxygen Therapy: Patient Spontanous Breathing  Post-op Assessment: Report given to PACU RN, Post -op Vital signs reviewed and stable and Patient moving all extremities  Post vital signs: Reviewed and stable  Complications: No apparent anesthesia complications

## 2012-06-16 ENCOUNTER — Encounter (HOSPITAL_COMMUNITY): Payer: Self-pay | Admitting: Anesthesiology

## 2012-06-16 NOTE — Anesthesia Preprocedure Evaluation (Signed)
Anesthesia Evaluation  Patient identified by MRN, date of birth, ID band Patient awake    Reviewed: Allergy & Precautions, H&P , NPO status , Patient's Chart, lab work & pertinent test results  Airway Mallampati: I TM Distance: >3 FB Neck ROM: full    Dental   Pulmonary          Cardiovascular hypertension, + Peripheral Vascular Disease Rhythm:regular Rate:Normal     Neuro/Psych    GI/Hepatic   Endo/Other  diabetes, Type 2, Oral Hypoglycemic Agents  Renal/GU      Musculoskeletal   Abdominal   Peds  Hematology   Anesthesia Other Findings   Reproductive/Obstetrics                           Anesthesia Physical Anesthesia Plan  ASA: III  Anesthesia Plan: MAC and Regional   Post-op Pain Management:    Induction: Intravenous  Airway Management Planned: Mask  Additional Equipment:   Intra-op Plan:   Post-operative Plan:   Informed Consent: I have reviewed the patients History and Physical, chart, labs and discussed the procedure including the risks, benefits and alternatives for the proposed anesthesia with the patient or authorized representative who has indicated his/her understanding and acceptance.     Plan Discussed with: Anesthesiologist, CRNA and Surgeon  Anesthesia Plan Comments:         Anesthesia Quick Evaluation

## 2012-06-16 NOTE — Op Note (Signed)
NAME:  [name], LENGEL NO.:  [id number]  MEDICAL RECORD NO.:  [id number]  LOCATION:  [facility]                         FACILITY:  [facility]  PHYSICIAN:  Anderson Malta, M.D.    DATE OF BIRTH:  [birth year]  DATE OF PROCEDURE: DATE OF DISCHARGE:  06/15/2012                              OPERATIVE REPORT   PREOPERATIVE DIAGNOSIS:  Left great toe infection.  POSTOPERATIVE DIAGNOSIS:  Left great toe infection.  PROCEDURE:  Left great toe amputation through the MTP joint.  SURGEON:  Anderson Malta, M.D.  ASSISTANT:  None.  ANESTHESIA:  Ankle block, ankle Esmarch approximately 10 minutes.  INDICATIONS:  [name] is a patient who had IP left toe amputation, this is failed to heal and has current active infection, presents now for MTP amputation.  CULTURES:  Obtained x2.  SPECIMENS:  Sent to Pathology.  PROCEDURE IN DETAIL:  The patient was brought to the operating room where ankle block anesthetic was induced.  Preop antibiotics were held until cultures obtained, then 1 g of vancomycin was given.  Left foot was prescribed with alcohol, was prepped with Hibiclens saline and draped in sterile manner.  Ankle Esmarch was utilized approximately 10 minutes.  Incision was made fishmouth type, centered around the MTP joint.  Skin and subcutaneous tissue were sharply divided.  The residual proximal phalanx of the toe was significantly osteomyelitic.  Sinus tract was present.  Cultures were obtained from within the sinus tract after amputating through the MTP joint.  Ankle Esmarch was released. Some bleeding was present.  Incision was then closed by first reapproximating the capsule over the metatarsal head followed by using a 3-0 Vicryl suture followed by another layer of 3-0 Vicryl suture and 3-0 nylon.  Good closure without tension was achieved. Thorough irrigation was performed after being amputation with 2 liters of irrigating solution.  Following closure, bulky dressing  was placed. She tolerated the procedure without immediate complication.     Anderson Malta, M.D.     GSD/MEDQ  D:  06/15/2012  T:  06/16/2012  Job:  [id number]

## 2012-06-18 LAB — WOUND CULTURE

## 2012-06-20 LAB — ANAEROBIC CULTURE

## 2012-07-15 ENCOUNTER — Other Ambulatory Visit: Payer: Self-pay | Admitting: Family Medicine

## 2012-08-03 ENCOUNTER — Telehealth: Payer: Self-pay | Admitting: Family Medicine

## 2012-08-03 NOTE — Telephone Encounter (Signed)
Patient has a 3:00 appointment tomorrow and is asking for the AVS's for every appointment that she has had since December.  She would like these to be ready when she comes in tomorrow.

## 2012-08-04 ENCOUNTER — Encounter: Payer: Self-pay | Admitting: Family Medicine

## 2012-08-04 ENCOUNTER — Ambulatory Visit (INDEPENDENT_AMBULATORY_CARE_PROVIDER_SITE_OTHER): Payer: Medicaid Other | Admitting: Family Medicine

## 2012-08-04 VITALS — BP 138/88 | HR 94 | Temp 98.8°F | Ht 70.0 in | Wt 194.1 lb

## 2012-08-04 DIAGNOSIS — I10 Essential (primary) hypertension: Secondary | ICD-10-CM

## 2012-08-04 DIAGNOSIS — IMO0002 Reserved for concepts with insufficient information to code with codable children: Secondary | ICD-10-CM

## 2012-08-04 DIAGNOSIS — E1065 Type 1 diabetes mellitus with hyperglycemia: Secondary | ICD-10-CM

## 2012-08-04 DIAGNOSIS — E1142 Type 2 diabetes mellitus with diabetic polyneuropathy: Secondary | ICD-10-CM

## 2012-08-04 DIAGNOSIS — E1149 Type 2 diabetes mellitus with other diabetic neurological complication: Secondary | ICD-10-CM

## 2012-08-04 DIAGNOSIS — S98112A Complete traumatic amputation of left great toe, initial encounter: Secondary | ICD-10-CM | POA: Insufficient documentation

## 2012-08-04 DIAGNOSIS — S98119A Complete traumatic amputation of unspecified great toe, initial encounter: Secondary | ICD-10-CM

## 2012-08-04 DIAGNOSIS — E1140 Type 2 diabetes mellitus with diabetic neuropathy, unspecified: Secondary | ICD-10-CM

## 2012-08-04 DIAGNOSIS — G4700 Insomnia, unspecified: Secondary | ICD-10-CM

## 2012-08-04 LAB — POCT GLYCOSYLATED HEMOGLOBIN (HGB A1C): Hemoglobin A1C: 12.3

## 2012-08-04 MED ORDER — INSULIN GLARGINE 100 UNIT/ML ~~LOC~~ SOLN: 14.0000 [IU] | Freq: Every morning | SUBCUTANEOUS | Status: DC

## 2012-08-04 MED ORDER — NORTRIPTYLINE HCL 25 MG PO CAPS: 25.0000 mg | ORAL_CAPSULE | Freq: Every day | ORAL | Status: DC

## 2012-08-04 MED ORDER — NORTRIPTYLINE HCL 50 MG PO CAPS: 50.0000 mg | ORAL_CAPSULE | Freq: Every day | ORAL | Status: DC

## 2012-08-04 MED ORDER — ZOLPIDEM TARTRATE 5 MG PO TABS: 5.0000 mg | ORAL_TABLET | Freq: Every evening | ORAL | Status: DC | PRN

## 2012-08-04 MED ORDER — LISINOPRIL 20 MG PO TABS: 20.0000 mg | ORAL_TABLET | Freq: Every day | ORAL | Status: DC

## 2012-08-04 MED ORDER — INSULIN GLARGINE 100 UNIT/ML ~~LOC~~ SOLN: SUBCUTANEOUS | Status: DC

## 2012-08-04 MED ORDER — DULOXETINE HCL 30 MG PO CPEP: 30.0000 mg | ORAL_CAPSULE | Freq: Every day | ORAL | Status: DC

## 2012-08-04 MED ORDER — INSULIN ASPART 100 UNIT/ML ~~LOC~~ SOLN: 20.0000 [IU] | Freq: Three times a day (TID) | SUBCUTANEOUS | Status: DC

## 2012-08-04 NOTE — Telephone Encounter (Signed)
AVS printed and given to [name].  Noreta Kue, Loralyn Fresh[place], [place]

## 2012-08-04 NOTE — Assessment & Plan Note (Signed)
On Nortriptyline 50mg . Had been on Cymbalta 60 mg. Refuses to increase dose of Gabapentin. P/ Will taper down the dose of Nortriptyline to 25 mg with the purpose of d/c it. Will return to Cymbalta 30 mg now that pt has Nortriptiline on board, once Nortriptiline has been discontinued will increase Cymbalta to 60 mg daily and continue current dose of gabapentin. We believe that with pt PMHx she will benefit better from SNRI Topical options will be discussed if this does not help with her symptoms. Discussed in depth that better sugar control is needed in order to slow progression.

## 2012-08-04 NOTE — Progress Notes (Signed)
Family Medicine Office Visit Note   Subjective:   Patient ID: [name], female  DOB: [birth year], 47 y.o.. MRN: [id number]   This is my first time seen [name]. She comes today for follow up.  06/15/2012 had amputation of left great toe.    DM on insulin Lantus 22 units and novolog from 22-28 unitsTID. Last A1c check was >14 in 02/2012 and her lowest has been on the 13. Pt reports extreme pain on her lower extremities intensity 8/10 bilaterally. She has tried several medication for neuropathic pain and refuses to increase dose of gabapentin. She has been on Cymbalta 60 mg in the past and is presently on Nortriptyline 50 mg. She though this medications were for her insomnia and stopped taking Cymbalta since reported continue with insomnia regardless this regimen.   HTN: on  Lisinopril 20 mg tab. Reports compliance. No side effects reported.  Pap smear not due til 2015  Review of Systems:  Pt denies SOB, chest pain, palpitations, headaches, dizziness, or weakness. No changes on urinary or BM habits. No unintentional weigh loss/gain.  Objective:   Physical Exam: Gen:  NAD HEENT: Moist mucous membranes  CV: Regular rate and rhythm, no murmurs rubs or gallops PULM: Clear to auscultation bilaterally. No wheezes/rales/rhonchi ABD: Soft, non tender, non distended, normal bowel sounds EXT: No edema. Left great toe absent. Two 0.5 cm wounds on lateral and medial aspect of stomp. No drainage or bad odor. Mild edema on left LE. No erythema.  Neuro: Alert and oriented x3. No focalization  Assessment & Plan:

## 2012-08-04 NOTE — Assessment & Plan Note (Addendum)
Poorly controlled Dm. On Insulin Lantus 22 u and Novolog 22-28 units TID. A1C today 12.3. We are very concerned about compliance. P/ Increase lantus to 24units. Continue SSI TID Close f/u to adjust medciation. Instructed to bring glucometer or CBG log.

## 2012-08-04 NOTE — Assessment & Plan Note (Signed)
Controlled on Lisinopril 20 mg. No changes on her current regimen.

## 2012-08-04 NOTE — Assessment & Plan Note (Addendum)
New two lesion on side of stump. No drainage, no signs of cellulitis. Pt still on Doxy. Ortho following, recommended wound consult. P/ Wound consult referral placed

## 2012-08-04 NOTE — Patient Instructions (Addendum)
Insomnia Insomnia is frequent trouble falling and/or staying asleep. Insomnia can be a long term problem or a short term problem. Both are common. Insomnia can be a short term problem when the wakefulness is related to a certain stress or worry. Long term insomnia is often related to ongoing stress during waking hours and/or poor sleeping habits. Overtime, sleep deprivation itself can make the problem worse. Every little thing feels more severe because you are overtired and your ability to cope is decreased. CAUSES   Stress, anxiety, and depression.  Poor sleeping habits.  Distractions such as TV in the bedroom.  Naps close to bedtime.  Engaging in emotionally charged conversations before bed.  Technical reading before sleep.  Alcohol and other sedatives. They may make the problem worse. They can hurt normal sleep patterns and normal dream activity.  Stimulants such as caffeine for several hours prior to bedtime.  Pain syndromes and shortness of breath can cause insomnia.  Exercise late at night.  Changing time zones may cause sleeping problems (jet lag). It is sometimes helpful to have someone observe your sleeping patterns. They should look for periods of not breathing during the night (sleep apnea). They should also look to see how long those periods last. If you live alone or observers are uncertain, you can also be observed at a sleep clinic where your sleep patterns will be professionally monitored. Sleep apnea requires a checkup and treatment. Give your caregivers your medical history. Give your caregivers observations your family has made about your sleep.  SYMPTOMS   Not feeling rested in the morning.  Anxiety and restlessness at bedtime.  Difficulty falling and staying asleep. TREATMENT   Your caregiver may prescribe treatment for an underlying medical disorders. Your caregiver can give advice or help if you are using alcohol or other drugs for self-medication. Treatment  of underlying problems will usually eliminate insomnia problems.  Medications can be prescribed for short time use. They are generally not recommended for lengthy use.  Over-the-counter sleep medicines are not recommended for lengthy use. They can be habit forming.  You can promote easier sleeping by making lifestyle changes such as:  Using relaxation techniques that help with breathing and reduce muscle tension.  Exercising earlier in the day.  Changing your diet and the time of your last meal. No night time snacks.  Establish a regular time to go to bed.  Counseling can help with stressful problems and worry.  Soothing music and white noise may be helpful if there are background noises you cannot remove.  Stop tedious detailed work at least one hour before bedtime. HOME CARE INSTRUCTIONS   Keep a diary. Inform your caregiver about your progress. This includes any medication side effects. See your caregiver regularly. Take note of:  Times when you are asleep.  Times when you are awake during the night.  The quality of your sleep.  How you feel the next day. This information will help your caregiver care for you.  Get out of bed if you are still awake after 15 minutes. Read or do some quiet activity. Keep the lights down. Wait until you feel sleepy and go back to bed.  Keep regular sleeping and waking hours. Avoid naps.  Exercise regularly.  Avoid distractions at bedtime. Distractions include watching television or engaging in any intense or detailed activity like attempting to balance the household checkbook.  Develop a bedtime ritual. Keep a familiar routine of bathing, brushing your teeth, climbing into bed at the same   time each night, listening to soothing music. Routines increase the success of falling to sleep faster.  Use relaxation techniques. This can be using breathing and muscle tension release routines. It can also include visualizing peaceful scenes. You can  also help control troubling or intruding thoughts by keeping your mind occupied with boring or repetitive thoughts like the old concept of counting sheep. You can make it more creative like imagining planting one beautiful flower after another in your backyard garden.  During your day, work to eliminate stress. When this is not possible use some of the previous suggestions to help reduce the anxiety that accompanies stressful situations. MAKE SURE YOU:   Understand these instructions.  Will watch your condition.  Will get help right away if you are not doing well or get worse. Document Released: 02/15/2000 Document Revised: 05/12/2011 Document Reviewed: 03/17/2007 [facility] Patient Information 2014 [place].  It has been a pleasure to see you today. Please take the medications as prescribed. Pamelor has been decreased to 25 mg and start taking Cymbalta 30 mg daily. In two weeks take Pamelor 25 mg every other day. By your next appointment we will be able to discontinue this medication. Make your next appointment in 1 month. Please bring your glucometer or a log of your blood sugars to your next appointment.

## 2012-08-04 NOTE — Assessment & Plan Note (Signed)
Pt report has tried everything and has difficulty falling sleep. P/ Ambien 5 mg PRN X1 prescription Next visit will check MDQ to evaluate more in depth her sleep disorder.

## 2012-08-20 ENCOUNTER — Telehealth: Payer: Self-pay | Admitting: Family Medicine

## 2012-08-20 NOTE — Telephone Encounter (Signed)
Patient is calling to check on the status of the referral for the wound center.  Also, she is requesting reports on where she was referred for 2 Xrays that she had done and an MRI and she would like that, she wants the actual notes.

## 2012-08-20 NOTE — Telephone Encounter (Signed)
Appointment scheduled at [facility] 09/08/2012 @ 1pm.  They had left her a message on 08/05/2012 to return their call to schedule but never got a call back from patient.  They will call patient today with appointment.  [name]

## 2012-09-01 ENCOUNTER — Telehealth: Payer: Self-pay | Admitting: Family Medicine

## 2012-09-01 NOTE — Telephone Encounter (Signed)
Pt is wanting a more detailed report on her records. She said the first one's we gave her was incomplete and did not include x-rays and other things. So she needs a complete copy of records. JW

## 2012-09-08 ENCOUNTER — Other Ambulatory Visit (HOSPITAL_BASED_OUTPATIENT_CLINIC_OR_DEPARTMENT_OTHER): Payer: Self-pay | Admitting: General Surgery

## 2012-09-08 ENCOUNTER — Encounter (HOSPITAL_BASED_OUTPATIENT_CLINIC_OR_DEPARTMENT_OTHER): Payer: Medicaid Other | Attending: General Surgery

## 2012-09-08 ENCOUNTER — Ambulatory Visit (HOSPITAL_COMMUNITY)
Admission: RE | Admit: 2012-09-08 | Discharge: 2012-09-08 | Disposition: A | Discharge status: Home / Self Care | Payer: Medicaid Other | Source: Ambulatory Visit | Attending: General Surgery | Admitting: General Surgery

## 2012-09-08 DIAGNOSIS — I739 Peripheral vascular disease, unspecified: Secondary | ICD-10-CM | POA: Insufficient documentation

## 2012-09-08 DIAGNOSIS — I10 Essential (primary) hypertension: Secondary | ICD-10-CM | POA: Insufficient documentation

## 2012-09-08 DIAGNOSIS — M869 Osteomyelitis, unspecified: Secondary | ICD-10-CM

## 2012-09-08 DIAGNOSIS — E1069 Type 1 diabetes mellitus with other specified complication: Secondary | ICD-10-CM | POA: Insufficient documentation

## 2012-09-08 DIAGNOSIS — T8189XA Other complications of procedures, not elsewhere classified, initial encounter: Secondary | ICD-10-CM | POA: Insufficient documentation

## 2012-09-08 DIAGNOSIS — Y835 Amputation of limb(s) as the cause of abnormal reaction of the patient, or of later complication, without mention of misadventure at the time of the procedure: Secondary | ICD-10-CM | POA: Insufficient documentation

## 2012-09-08 DIAGNOSIS — M86679 Other chronic osteomyelitis, unspecified ankle and foot: Secondary | ICD-10-CM | POA: Insufficient documentation

## 2012-09-08 DIAGNOSIS — S98119A Complete traumatic amputation of unspecified great toe, initial encounter: Secondary | ICD-10-CM | POA: Insufficient documentation

## 2012-09-08 DIAGNOSIS — L97509 Non-pressure chronic ulcer of other part of unspecified foot with unspecified severity: Secondary | ICD-10-CM | POA: Insufficient documentation

## 2012-09-09 ENCOUNTER — Other Ambulatory Visit: Payer: Self-pay

## 2012-09-09 NOTE — Progress Notes (Signed)
[facility]  NAME:  [name], [name] NO.:  [id number]  MEDICAL RECORD NO.:  [id number]      DATE OF BIRTH:  February 12, 1966  PHYSICIAN:  Judene Companion, M.D.      VISIT DATE:  09/08/2012                                  OFFICE VISIT   HISTORY:  This is a first-time visit for her at the [facility].  She is a 47 year old type 1 diabetic who has had great problems controlling her diabetes, her last A1c was about 13.  Her blood sugar today was 132. Her blood pressure 177/93, respirations 18, temperature 98.6.  She weighs 190 pounds.  She takes Lantus and NovoLog insulin to control her diabetes.  She is a smoker and has stress incontinence, also is on Cymbalta for anxiety.  She had her left great toe amputated about 10 years ago and since that time, has had recurrent ulcerations of the left great toe and apparently, she pulled a little piece of bone out herself. She has 2 ulcerations at the stump of the great toe and I probed these and cleansed them and elected to be treated with silver alginate and I also ordered an x-ray to rule out osteo of the metatarsal.  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  She already is on an antibiotic, she is not sure which one, so we will check a little of that.  DIAGNOSES: 1. Type 1 diabetes. 2. Essential hypertension. 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 4. Peripheral vascular disease.     Judene Companion, M.D.     PP/MEDQ  D:  09/08/2012  T:  09/09/2012  Job:  [id number]

## 2012-09-15 ENCOUNTER — Telehealth: Payer: Self-pay | Admitting: Family Medicine

## 2012-09-15 NOTE — Telephone Encounter (Signed)
Pt says she has been calling for 3 weeks to request detailed records on her visits  Dec 2013 thru Feb 2014 as they pertain to her foot. She has not received a response. If she doesn't receive a call back today, she will contact the office manager with a complaint.

## 2012-09-15 NOTE — Telephone Encounter (Signed)
Spoke with patietn and infored her that I have printed the OV notes and that she can pick them up and when she comes in she needs to fill out ROI. Patient states that notes Dr. Doreene Nest put in the [facility] did not state that she had a pedicure done and that this is what led to her toe being amputated. Dr. Doreene Nest is no longer in clinic and patient needs to sign a adendum of records form and that will be given to new PCP to evaluate and make a final decision weather or not

## 2012-11-18 ENCOUNTER — Ambulatory Visit: Payer: Medicaid Other | Admitting: Family Medicine

## 2012-12-21 ENCOUNTER — Ambulatory Visit (INDEPENDENT_AMBULATORY_CARE_PROVIDER_SITE_OTHER): Payer: Medicaid Other | Admitting: Family Medicine

## 2012-12-21 DIAGNOSIS — L97529 Non-pressure chronic ulcer of other part of left foot with unspecified severity: Secondary | ICD-10-CM

## 2012-12-21 DIAGNOSIS — E1140 Type 2 diabetes mellitus with diabetic neuropathy, unspecified: Secondary | ICD-10-CM

## 2012-12-21 DIAGNOSIS — L97509 Non-pressure chronic ulcer of other part of unspecified foot with unspecified severity: Secondary | ICD-10-CM

## 2012-12-21 DIAGNOSIS — E1065 Type 1 diabetes mellitus with hyperglycemia: Secondary | ICD-10-CM

## 2012-12-21 DIAGNOSIS — L989 Disorder of the skin and subcutaneous tissue, unspecified: Secondary | ICD-10-CM

## 2012-12-21 DIAGNOSIS — E1149 Type 2 diabetes mellitus with other diabetic neurological complication: Secondary | ICD-10-CM

## 2012-12-21 DIAGNOSIS — E1142 Type 2 diabetes mellitus with diabetic polyneuropathy: Secondary | ICD-10-CM

## 2012-12-21 MED ORDER — HYDROCODONE-ACETAMINOPHEN 5-325 MG PO TABS: 1.0000 | ORAL_TABLET | Freq: Three times a day (TID) | ORAL | Status: DC | PRN

## 2012-12-21 MED ORDER — AMITRIPTYLINE HCL 25 MG PO TABS: 25.0000 mg | ORAL_TABLET | Freq: Every day | ORAL | Status: DC

## 2012-12-21 NOTE — Patient Instructions (Addendum)
It is very important that you keep good glucose control. Please make an appointment to get evaluated by Dr. Valentina Lucks here.  Please check your sugars 3 times a day and bring a log to that appointment. For now use Lantus and increase 2 units daily until you get glucose range of 180-200.  I have placed an appointment to examining the blood flow to the lower extremities. This will be done at [facility] and you will receive the appropriate information as we schedule it. I have also placed a referral to Vascular Surgery in order to evaluate that foot lesion of your third left toe. It is important that you keep good hygiene of the extremity and if you develop intense swelling redness or other symptoms go to the emergency department to be evaluated away.

## 2012-12-21 NOTE — Progress Notes (Signed)
Family Medicine Office Visit Note   Subjective:   Patient ID: [name], female  DOB: May 25, 1965, 47 y.o.. MRN: [id number]   Pt that comes today complaining of worsening neuropathic pain. She has been taking Cymbalta with no improvement of her symptoms. She discontinued Nortriptyline stating this medication was not efficacious for her. She also stopped taking Gabapentin because the dose went up to 2 tablets 3 times a day and that were " too many pills for me to take".  Describes her symptoms as burning sensation and considerable pain bilaterally but worse on her left foot. She had her left great toe amputated at the beginning of this year and from that surgeries she has had successfully recuperated.  Her other issue today is about her sugar control. She is on NovoLog and Lantus, and reports her sugars have been too elevated and refuses A1c testing today. She states taking Lantus 25 units QHS and Novolog up to 20 units 3 times a day. Patient reports concern of weight gain with Novolog and refuses to continue this medication. She states that Lantus "is okay". Pt also refuses Insulin 70/30 as she was in the past.   Her third concern is about her left third toe, she complains of some redness and pain and unable to tell us when she noticed it first. As above explained the patient has had 2 surgeries for her left first toe amputation.  She denies history of previous trauma or procedures involving that toe. She refuses to get evaluated by orthopedic surgeon at this time. Patient reports she tries to have that lower extremity elevated and this seems to help with pain.  Review of Systems:  Pt denies fever, chills, SOB, chest pain, palpitations, headaches, dizziness, or weakness. No changes on urinary or BM habits. Reports unintentional weigh gain of 5 Lb since Jan/2014 (10 months)  Objective:   Physical Exam: Gen:  NAD HEENT: Moist mucous membranes  CV: Regular rate and rhythm, no murmurs rubs or  gallops PULM: Clear to auscultation bilaterally. No wheezes/rales/rhonchi.  ABD: Soft, non tender, non distended, normal bowel sounds EXT: Left lower extremity: Healed surgical scar s/p amputation of first toe. Erythema on the dorsal aspect of the third toe, 1cm diameter black eschar present on plantar aspect of same toe. No drainage. Capillary refill still <3sec and pedal pulses present but diminished in intensity. No other toe involvement.  Neuro: Alert and oriented x3. Decrease sensation on LE bilaterally. 5/5 strength. Normal reflexes bilaterally.   Assessment & Plan:

## 2012-12-21 NOTE — Assessment & Plan Note (Signed)
Pt discontinued Nortriptyline and neuropathic pain is worse. She is on Cymbalta but reports is not helping. She also self discontinued Gabapentin. Pt requesting Amitriptyline.   Very difficult to treat due to pt's non-compliance and uncontrolled DM Discussed that Amitriptyline and Pamelor is same clase medication but with more side effect profile. Pt informed of side effects of this medication but she would like to start a trial of this med. Prescribed medication with close follow up in 2 weeks.  Also discussed importance of DM control

## 2012-12-22 ENCOUNTER — Telehealth: Payer: Self-pay | Admitting: Family Medicine

## 2012-12-22 DIAGNOSIS — L989 Disorder of the skin and subcutaneous tissue, unspecified: Secondary | ICD-10-CM | POA: Insufficient documentation

## 2012-12-22 NOTE — Telephone Encounter (Signed)
Pt called again and wants to know how much longer its going to be. She wants her pain medication now and is tired of waiting.jw

## 2012-12-22 NOTE — Assessment & Plan Note (Addendum)
Uncontrolled DM, pt refuses A1C test today, as well as any other blood work.  Her A1C since last year has been: Feb/2013 >14, June/2013 10. Sept/2013 13.6, Dic/2013 >14, this year June/2014 12.3. Pt is non compliant with medication and now refuses to use Novolog or 70/30. She only agrees with Lantus.  Discussed importance of blood glucose control in order to minimize complications, she has already one toe amputated early this year. Pt is on Lantus 25u and was on Novolog 20 TID. She reports CBG's at home in the 300's with this regimen. Since she refuses to use Novolog or any other insuline than Lantus, we recommended to increase Lantus by 2 units QHS until CBG's are in the 180-200's range and make appointmnet with Dr. Valentina Lucks for DM clinic.

## 2012-12-22 NOTE — Telephone Encounter (Signed)
Pt called because she said that [facility] states that they have not received a request for Elavil. JW

## 2012-12-22 NOTE — Assessment & Plan Note (Addendum)
Pt non compliant with treatment and very hard to discuss plan since she does not properly follow up medical recommendations.  Case has been discussed with attending Dr. Nori Riis and we both explained our medical recommendations to patient.  1. Pt needs to keep area clean with good higiene. (there were no signs of local infection that require debridement at this point) 2. ABI. This will be planned to do at [facility] (her last ABI was 08/2011 and was wnl) 3. Referral to Vascular Surgery placed. If the ABI are concerning this may help pt to get appointment with vascular sooner. 4. Next step and not ordered yet will be imaging (MRI) if lesion worsens or specialist evaluation takes longer.   I am very concerned about compliance but pt has not mental condition that limits her ability to decide what is best for her. All the possible complications of not following up properly her condition have been explained  including limb amputation and infection. Pt is aware that is her responsibility to keep appointments and medical recommendations.

## 2012-12-23 NOTE — Telephone Encounter (Signed)
Elavil was sent to pharmacy as 1 tab with 2 refills on 12/21/12. Forward to PCP to send in new Rx.Steele Stracener, Kevin Fenton

## 2012-12-23 NOTE — Telephone Encounter (Signed)
Sorry for the mistake. I have place a new phone order with right amount of tab.

## 2012-12-24 NOTE — Telephone Encounter (Signed)
Called and informed patient that her new Rx was sent to her pharmacy, she said she had already picked it up.[name], Kevin Fenton

## 2012-12-28 ENCOUNTER — Encounter: Payer: Self-pay | Admitting: Pharmacist

## 2012-12-28 ENCOUNTER — Ambulatory Visit (INDEPENDENT_AMBULATORY_CARE_PROVIDER_SITE_OTHER): Payer: Medicaid Other | Admitting: Pharmacist

## 2012-12-28 VITALS — BP 152/80 | HR 84 | Ht 69.0 in | Wt 198.9 lb

## 2012-12-28 DIAGNOSIS — E1065 Type 1 diabetes mellitus with hyperglycemia: Secondary | ICD-10-CM

## 2012-12-28 MED ORDER — EXENATIDE 5 MCG/0.02ML ~~LOC~~ SOPN: 5.0000 ug | PEN_INJECTOR | Freq: Two times a day (BID) | SUBCUTANEOUS | Status: DC

## 2012-12-28 NOTE — Progress Notes (Signed)
S:    Patient arrives frustrated due to lack of follow up from visit last week. Presents for diabetes follow up.  Patient reports having history of Diabetes since she was 47 years old.  Patient currently takes Lantus 30 units daily. NPH stopped at visit last week due to reported weight gain of 36 lbs. Reports that she is unable to exercise due to the pain in her left foot secondary to a possible infection in the toes. Reports avoiding carbs and eating vegetables, fruits, baked food, drinking water and avoiding sugary drinks.    O:  . Lab Results  Component Value Date   HGBA1C 12.3 08/04/2012     Patient reported home fasting CBG readings around 180-190.   Patient reported 2 hour post-prandial/random CBG readings of 200s--211 today after lunch. Reports lowest BG in the past month was 65, highest was 416.   A/P: Diabetes currently uncontrolled.   Reports one hypoglycemic event in the past month and is able to verbalize appropriate hypoglycemia management plan.  Reports adherence with medication.  Control is suboptimal due to medication side effects and inability to exercise.   Started Byetta (glyburide) at 5 mcg with each of the two biggest meals of the day. Counseling provided on how to use the pen and possible side effects. Restarted Novolog 20 units with each full meal and increased Lantus to 40 units daily.  Written patient instructions provided.  Follow up via phone call with Dr. Valentina Lucks within the week to make sure blood sugar is controlled and she is not experiencing hypoglycemia.   Total time in face to face counseling 40 minutes.  Patient seen with Cammy Brochure, PharmD Candidate.

## 2012-12-28 NOTE — Assessment & Plan Note (Signed)
Diabetes currently uncontrolled.   Reports one hypoglycemic event in the past month and is able to verbalize appropriate hypoglycemia management plan.  Reports adherence with medication.  Control is suboptimal due to medication side effects and inability to exercise.   Started Byetta (glyburide) at 5 mcg with each of the two biggest meals of the day. Counseling provided on how to use the pen and possible side effects. Restarted Novolog 20 units with each full meal and increased Lantus to 40 units daily.  Written patient instructions provided.  Follow up via phone call with Dr. Valentina Lucks within the week to make sure blood sugar is controlled and she is not experiencing hypoglycemia.   Total time in face to face counseling 40 minutes.  Patient seen with Cammy Brochure, PharmD Candidate.

## 2012-12-28 NOTE — Patient Instructions (Addendum)
Thank you for coming in today!  We are going to start a new medication called Byetta. You will inject 5 mcg of the Byetta with each of the two biggest meals of the day.   Please start taking 20 units of Novolog with each full meal. Please start taking 40 units of Lantus every day.  Keep eating lots of veggies and baked food and drinking water.  Dr. Valentina Lucks will call you within a week to see how your blood sugar is doing. If you do not hear about your toe, please make an appointment to see Dr. Thomes Dinning next week.

## 2012-12-29 NOTE — Progress Notes (Signed)
Patient ID: [name], female   DOB: 05-09-1965, 47 y.o.   MRN: [id number] Reviewed: Agree with Dr. Graylin Shiver documentation and management.

## 2012-12-30 ENCOUNTER — Other Ambulatory Visit: Payer: Self-pay | Admitting: *Deleted

## 2012-12-30 DIAGNOSIS — L97909 Non-pressure chronic ulcer of unspecified part of unspecified lower leg with unspecified severity: Secondary | ICD-10-CM

## 2013-01-10 ENCOUNTER — Telehealth: Payer: Self-pay | Admitting: Pharmacist

## 2013-01-10 NOTE — Telephone Encounter (Signed)
Patient returned call to me following two attempts wtihout response.  She reports foul smelling toe and vascular evaluation 11/14 (earliest schedule available).  CBGs improved to 95-107 with significant nausea due to byetta.  Shehas had 5-6 episodes of low CBGs of < 80 mg/dl.  Instructed to STOP Byetta, Decrease the lantus to 36 units from 40 units.  Additionally, encouraged patient to make small carbohydrate based adjustments for the Novolog dose of 20 units prior to meals twice daily.   Asked patient to call back and ask for follow up by Dr. Nori Riis or [place] with week for additional evaluation/referal.

## 2013-01-12 ENCOUNTER — Ambulatory Visit (INDEPENDENT_AMBULATORY_CARE_PROVIDER_SITE_OTHER): Payer: Medicaid Other | Admitting: Family Medicine

## 2013-01-12 ENCOUNTER — Encounter: Payer: Self-pay | Admitting: Family Medicine

## 2013-01-12 VITALS — BP 171/82 | HR 96 | Temp 98.7°F | Ht 70.0 in | Wt 201.6 lb

## 2013-01-12 DIAGNOSIS — M869 Osteomyelitis, unspecified: Secondary | ICD-10-CM

## 2013-01-12 MED ORDER — LEVOFLOXACIN 750 MG PO TABS: 750.0000 mg | ORAL_TABLET | Freq: Every day | ORAL | Status: DC

## 2013-01-12 MED ORDER — OXYCODONE-ACETAMINOPHEN 10-325 MG PO TABS: 1.0000 | ORAL_TABLET | Freq: Three times a day (TID) | ORAL | Status: DC | PRN

## 2013-01-12 MED ORDER — DOXYCYCLINE HYCLATE 100 MG PO CAPS: 100.0000 mg | ORAL_CAPSULE | Freq: Two times a day (BID) | ORAL | Status: DC

## 2013-01-12 NOTE — Patient Instructions (Signed)
I am setting up an MRI of your foot. I am also starting you on TWO antibiotics for now. Please call us after seeing the vascular surgeon and let us know wha the said. If yo uget  FEVER, we probably need to go ahead with admission. If it gets worse---admission would be my recommendation. I am also giving you some pain medicine. We need to see you back next week (unless you end up ion the hospital before that)

## 2013-01-13 ENCOUNTER — Encounter: Payer: Self-pay | Admitting: Vascular Surgery

## 2013-01-13 NOTE — Progress Notes (Signed)
  Subjective:    Patient ID: [name], female    DOB: [birth year], 47 y.o.   MRN: [id number]  HPI Left foot pain. Gotten much worse in the last 23 days and she's having some bleeding from the bottom of the foot and some pus oozing from her toe. No fevers. Had tried using the hydrocodone for her foot pain and it is not helping much.   Review of Systems Denies fever, sweats, chills. See history of present illness above for additional pertinent review of systems.    Objective:   Physical Exam  Vital signs are reviewed and elevated blood pressure as noted. Followup blood pressures more reasonable. GENERAL: Well-developed female no acute distress in FOOT: Left. Status post great toe amputation. The entire foot is swollen and red but is not warm. There is small amount of blood coming from the area under the fourth toe on the plantar surface. I don't see any frank pus.      Assessment & Plan:  Concern for osteomyelitis. She has a vascular prominence set up in 36 hours and would like to attend that. I recommend she be admitted for IV antibiotics and further workup but she declined and would like to keep the appointment with the vascular surgeon she's waited so long for that. I will start her empirically on some antibiotics. 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 She will follow up with me by phone after she sees the vascular surgeon and will make a plan after that. If she develops any worsening pain, fever etc. she noticed that immediately to the emergency room for probable admission for osteomyelitis.

## 2013-01-14 ENCOUNTER — Ambulatory Visit (INDEPENDENT_AMBULATORY_CARE_PROVIDER_SITE_OTHER): Payer: Medicaid Other | Admitting: Vascular Surgery

## 2013-01-14 ENCOUNTER — Ambulatory Visit (INDEPENDENT_AMBULATORY_CARE_PROVIDER_SITE_OTHER)
Admission: RE | Admit: 2013-01-14 | Discharge: 2013-01-14 | Disposition: A | Discharge status: Home / Self Care | Payer: Medicaid Other | Source: Ambulatory Visit | Attending: Vascular Surgery | Admitting: Vascular Surgery

## 2013-01-14 ENCOUNTER — Ambulatory Visit (HOSPITAL_COMMUNITY)
Admission: RE | Admit: 2013-01-14 | Discharge: 2013-01-14 | Disposition: A | Discharge status: Home / Self Care | Payer: Medicaid Other | Source: Ambulatory Visit | Attending: Vascular Surgery | Admitting: Vascular Surgery

## 2013-01-14 ENCOUNTER — Encounter: Payer: Self-pay | Admitting: Vascular Surgery

## 2013-01-14 ENCOUNTER — Telehealth: Payer: Self-pay | Admitting: *Deleted

## 2013-01-14 VITALS — BP 133/70 | HR 87 | Temp 98.1°F | Ht 70.0 in | Wt 204.0 lb

## 2013-01-14 DIAGNOSIS — L97909 Non-pressure chronic ulcer of unspecified part of unspecified lower leg with unspecified severity: Secondary | ICD-10-CM

## 2013-01-14 DIAGNOSIS — I739 Peripheral vascular disease, unspecified: Secondary | ICD-10-CM | POA: Insufficient documentation

## 2013-01-14 DIAGNOSIS — L98499 Non-pressure chronic ulcer of skin of other sites with unspecified severity: Secondary | ICD-10-CM | POA: Insufficient documentation

## 2013-01-14 DIAGNOSIS — I7025 Atherosclerosis of native arteries of other extremities with ulceration: Secondary | ICD-10-CM | POA: Insufficient documentation

## 2013-01-14 NOTE — Progress Notes (Signed)
[facility]  Referred by:  Dayarmys [place] Gwendalyn Ege, MD [address], [place] [address]  Reason for referral: L 3rd toe ulcer  History of Present Illness  [name] is a 47 y.o. (04-02-1965) female w/ IDDM and prior L great toe amp. who presents with chief complaint: L 3rd toe ulcer.  Onset of symptom occurred ~1 month ago.  Pain is described as aching in open wound on L 3rd toe, severity 5-10/10, and associated with manipulation of open wound.  Patient has attempted to treat this pain with abx and OTC pain rx.  The patient has no rest pain symptoms but limits her ambulation due to her wound.  Atherosclerotic risk factors include: IDDM, active smoking.  Pt also notes some right foot paraesthesias.  Past Medical History  Diagnosis Date  . Diabetes type 1, uncontrolled   . Chest tightness   . Urinary frequency   . Nausea and vomiting in adult   . Diabetic neuropathy   . Essential hypertension   . Yeast vaginitis   . GERD (gastroesophageal reflux disease)     Past Surgical History  Procedure Laterality Date  . Endometrial ablation    . Amputation Left 06/15/2012    Procedure: AMPUTATION DIGIT;  Surgeon: Meredith Pel, MD;  Location: [facility];  Service: Orthopedics;  Laterality: Left;  Left great toe revision amputation    History   Social History  . Marital Status: Divorced    Spouse Name: N/A    Number of Children: 1  . Years of Education: N/A   Occupational History  .     Social History Main Topics  . Smoking status: Current Every Day Smoker -- 0.50 packs/day for 22 years    Types: Cigarettes  . Smokeless tobacco: Never Used  . Alcohol Use: Yes     Comment: sociial  . Drug Use: No  . Sexual Activity: Yes    Partners: Male    Birth Control/ Protection: IUD   Other Topics Concern  . Not on file   Social History Narrative  . No narrative on file    Family History  Problem Relation Age of Onset  . Diabetes Mother    . Heart attack Mother   . Heart disease Mother     before age 34  . Hypertension Mother   . Hyperlipidemia Mother   . Diabetes Father   . Heart attack Father   . Heart disease Father     before age 65   Current Outpatient Prescriptions on File Prior to Visit  Medication Sig Dispense Refill  . ACCU-CHEK AVIVA PLUS test strip USE TO CHECK BLOOD SUGAR BEFORE MEALS AND AT BEDTIME  100 each  12  . amitriptyline (ELAVIL) 25 MG tablet Take 1 tablet (25 mg total) by mouth at bedtime.  1 tablet  2  . Blood Glucose Monitoring Suppl (ONE TOUCH ULTRA SYSTEM KIT) W/DEVICE KIT 1 kit by Does not apply route once.  1 each  0  . doxycycline (VIBRAMYCIN) 100 MG capsule Take 1 capsule (100 mg total) by mouth 2 (two) times daily.  20 capsule  1  . DULoxetine (CYMBALTA) 30 MG capsule Take 1 capsule (30 mg total) by mouth daily.  30 capsule  3  . insulin aspart (NOVOLOG) 100 UNIT/ML injection Inject 20 Units into the skin 3 (three) times daily before meals.      . insulin glargine (LANTUS) 100 UNIT/ML injection Inject 0.4 mLs (  40 Units total) into the skin daily.      Marland Kitchen levofloxacin (LEVAQUIN) 750 MG tablet Take 1 tablet (750 mg total) by mouth daily.  14 tablet  1  . lisinopril (PRINIVIL,ZESTRIL) 20 MG tablet Take 1 tablet (20 mg total) by mouth daily.  90 tablet  4  . omeprazole (PRILOSEC) 20 MG capsule Take 20 mg by mouth daily.      Marland Kitchen oxyCODONE-acetaminophen (PERCOCET) 10-325 MG per tablet Take 1 tablet by mouth every 8 (eight) hours as needed for pain.  60 tablet  0  . BIOTIN FORTE PO Take 1 tablet by mouth daily.      . diphenhydramine-acetaminophen (TYLENOL PM) 25-500 MG TABS Take 1 tablet by mouth at bedtime as needed (sleep).      Marland Kitchen exenatide (BYETTA 5 MCG PEN) 5 MCG/0.02ML SOPN injection Inject 0.02 mLs (5 mcg total) into the skin 2 (two) times daily with a meal.  1.2 mL  0  . zolpidem (AMBIEN) 5 MG tablet Take 1 tablet (5 mg total) by mouth at bedtime as needed for sleep.  30 tablet  0   No  current facility-administered medications on file prior to visit.    Allergies  Allergen Reactions  . Latex Rash   REVIEW OF SYSTEMS:  (Positives checked otherwise negative)  CARDIOVASCULAR:  []  chest pain, []  chest pressure, []  palpitations, []  shortness of breath when laying flat, []  shortness of breath with exertion,  []  pain in feet when walking, [x]  pain in feet when laying flat, []  history of blood clot in veins (DVT), []  history of phlebitis, [x]  swelling in legs, []  varicose veins  PULMONARY:  []  productive cough, []  asthma, []  wheezing  NEUROLOGIC:  [x]  weakness in arms or legs, []  numbness in arms or legs, []  difficulty speaking or slurred speech, []  temporary loss of vision in one eye, []  dizziness  HEMATOLOGIC:  []  bleeding problems, []  problems with blood clotting too easily  MUSCULOSKEL:  []  joint pain, []  joint swelling  GASTROINTEST:  []  vomiting blood, []  blood in stool     GENITOURINARY:  []  burning with urination, []  blood in urine  PSYCHIATRIC:  []  history of major depression  INTEGUMENTARY:  []  rashes, []  ulcers  CONSTITUTIONAL:  []  fever, []  chills  For VQI Use Only  PRE-ADM LIVING: Home  AMB STATUS: Ambulatory  CAD Sx: None  PRIOR CHF: None  STRESS TEST: [x]  No, [ ]  Normal, [ ]  + ischemia, [ ]  + MI, [ ]  Both  Physical Examination  Filed Vitals:   01/14/13 1042  BP: 133/70  Pulse: 87  Temp: 98.1 F (36.7 C)  TempSrc: Oral  Height: 5\' 10"  (1.778 m)  Weight: 204 lb (92.534 kg)  SpO2: 98%   Body mass index is 29.27 kg/(m^2).  General: A&O x 3, WD, overweight  Head: [place]/AT  Ear/Nose/Throat: Hearing grossly intact, nares w/o erythema or drainage, oropharynx w/o Erythema/Exudate, Mallampati score: 3  Eyes: PERRLA, EOMI  Neck: Supple, no nuchal rigidity, no palpable LAD  Pulmonary: Sym exp, good air movt, CTAB, no rales, rhonchi, & wheezing  Cardiac: RRR, Nl S1, S2, no Murmurs, rubs or gallops  Vascular: Vessel Right Left  Radial  Palpable Palpable  Brachial Palpable Palpable  Carotid Palpable, without bruit Palpable, without bruit  Aorta Not palpable N/A  Femoral Palpable Palpable  Popliteal Not palpable Not palpable  PT Palpable Palpable  DP Palpable Palpable   Gastrointestinal: soft, NTND, -G/R, - HSM, - masses, - CVAT B, aorta  no palpable due to pannus  Musculoskeletal: M/S 5/5 throughout , R leg without ischemic changes , L great toe amputation healed, L 3rd toe with ulcer with granulation tissue, some discoloration of 3rd distal and proximal phlanage  Neurologic: CN 2-12 intact , Pain and light touch intact in extremities including feet, Motor exam as listed above  Psychiatric: Judgment intact, Mood & affect appropriate for pt's clinical situation  Dermatologic: See M/S exam for extremity exam, no rashes otherwise noted  Lymph : No Cervical, Axillary, or Inguinal lymphadenopathy   Non-Invasive Vascular Imaging  ABI (Date: 01/14/2013)  R: 1.23, DP: bi, PT: bi, TBI: 1.02  L: 1.08, DP: ni, PT: bi, TBI: 1.25  L arterial duplex(01/14/2013)  Triphasic and biphasic flow throughout  Outside Studies/Documentation 2 pages of outside documents were reviewed including: outpatient clinic chart.  Medical Decision Making  [name] is a 47 y.o. female who presents with: likely diabetes related ulceration L 3rd digit, diabetic neuropathy, minimal PAD   ABI demonstrate minimal PAD at this point, but medial calcification is already occurring so long-term suveillance will be needed due the risk of development of further tibial arterial disease.  At this point, I would focus on optimizing medical care including adding a baby aspirin a day.  I discussed with the patient the walking plan once she healed her toe.  I am referring her to [facility] wound care clinic due to the multiple modalities available there to facilitate wound healing.  Luckily her TBI suggest she will heal any procedure including a  toe amputation as needed.  I discussed in depth with the patient the nature of atherosclerosis, and emphasized the importance of maximal medical management including strict control of blood pressure, blood glucose, and lipid levels, antiplatelet agents, obtaining regular exercise, and cessation of smoking.  The patient is aware that without maximal medical management the underlying atherosclerotic disease process will progress, limiting the benefit of any interventions.  I reiterated the need to quit smoking and she is going to try on her own.  Thank you for allowing [place] to participate in this patient's care.  Adele Barthel, MD [facility] Office: [phone] Pager: [phone]  01/14/2013, 12:47 PM

## 2013-01-14 NOTE — Telephone Encounter (Signed)
Patient was seen by [facility] and was referred to [facility].  Due to patient having Medicaid, office requesting NPI #  to authorize appt/referral.  NPI # given.  Nolene Ebbs, RN

## 2013-01-17 ENCOUNTER — Telehealth: Payer: Self-pay | Admitting: Family Medicine

## 2013-01-17 NOTE — Telephone Encounter (Signed)
[name] called from the pre-surgery center and wanted to let us know that we need to change the orders on the MRI. Needs to say Left foot without contrast and the correct CPT code is 737.18, also needs to be approved through [employer]. Please call [name] at [phone]. When this is corrected. jw

## 2013-01-17 NOTE — Telephone Encounter (Signed)
Spoke with [place] with [employer]. For [employer] and added on the requested ICD9 code and called Kendra back. The Auth # is still the same

## 2013-01-18 ENCOUNTER — Other Ambulatory Visit: Payer: Self-pay | Admitting: Family Medicine

## 2013-01-18 ENCOUNTER — Ambulatory Visit (HOSPITAL_COMMUNITY)
Admission: RE | Admit: 2013-01-18 | Discharge: 2013-01-18 | Disposition: A | Discharge status: Home / Self Care | Payer: Medicaid Other | Source: Ambulatory Visit | Attending: Family Medicine | Admitting: Family Medicine

## 2013-01-18 ENCOUNTER — Telehealth: Payer: Self-pay | Admitting: Family Medicine

## 2013-01-18 DIAGNOSIS — S98919A Complete traumatic amputation of unspecified foot, level unspecified, initial encounter: Secondary | ICD-10-CM | POA: Insufficient documentation

## 2013-01-18 DIAGNOSIS — M869 Osteomyelitis, unspecified: Secondary | ICD-10-CM

## 2013-01-18 DIAGNOSIS — L97509 Non-pressure chronic ulcer of other part of unspecified foot with unspecified severity: Secondary | ICD-10-CM | POA: Insufficient documentation

## 2013-01-18 DIAGNOSIS — L089 Local infection of the skin and subcutaneous tissue, unspecified: Secondary | ICD-10-CM

## 2013-01-18 DIAGNOSIS — E11628 Type 2 diabetes mellitus with other skin complications: Secondary | ICD-10-CM

## 2013-01-18 DIAGNOSIS — E119 Type 2 diabetes mellitus without complications: Secondary | ICD-10-CM | POA: Insufficient documentation

## 2013-01-18 NOTE — Telephone Encounter (Signed)
Left mssg on vm She has infection in several of bones of foot We need to set her up to see ortho---is there anyone she prefers? [name]

## 2013-01-24 ENCOUNTER — Encounter (HOSPITAL_BASED_OUTPATIENT_CLINIC_OR_DEPARTMENT_OTHER): Payer: Medicaid Other | Attending: Plastic Surgery

## 2013-01-24 ENCOUNTER — Encounter: Payer: Medicaid Other | Admitting: Surgery

## 2013-01-24 ENCOUNTER — Other Ambulatory Visit (HOSPITAL_COMMUNITY): Payer: Medicaid Other

## 2013-01-24 ENCOUNTER — Encounter (HOSPITAL_COMMUNITY): Payer: Medicaid Other

## 2013-01-25 ENCOUNTER — Telehealth: Payer: Self-pay | Admitting: Family Medicine

## 2013-01-25 DIAGNOSIS — M869 Osteomyelitis, unspecified: Secondary | ICD-10-CM

## 2013-01-25 DIAGNOSIS — E1169 Type 2 diabetes mellitus with other specified complication: Secondary | ICD-10-CM

## 2013-01-25 NOTE — Telephone Encounter (Signed)
Pt called about her referral to [facility].  She says" she is about to lose a toe just waiting" Would like for dr neal or sara to call her

## 2013-01-26 NOTE — Telephone Encounter (Signed)
Call murphy and Noemi Chapel to set up appointment and was informed that patient needs to call billing before an appointment can be made. I called patient and informed her of this and gave her the number

## 2013-01-31 ENCOUNTER — Telehealth: Payer: Self-pay | Admitting: Family Medicine

## 2013-01-31 NOTE — Telephone Encounter (Signed)
Pt calledf and does not to go to [facility]. She wants to go to [facility] and see Dr. Rhona Raider. She would like you to make an appointment there and let her know when it is. [name]

## 2013-02-01 NOTE — Telephone Encounter (Signed)
Please read pt's phone note in order to coordinate referral. Thanks.

## 2013-02-02 NOTE — Telephone Encounter (Signed)
Pt called about referral. She wants to know how long this will take. She said" it took over 2 weeks the last time to get an appt and she cant wait that long this time. She has an infection in her bone and it cant wait" Please advise

## 2013-02-07 ENCOUNTER — Encounter (HOSPITAL_BASED_OUTPATIENT_CLINIC_OR_DEPARTMENT_OTHER): Payer: Medicaid Other | Attending: Plastic Surgery

## 2013-02-07 DIAGNOSIS — G589 Mononeuropathy, unspecified: Secondary | ICD-10-CM | POA: Insufficient documentation

## 2013-02-07 DIAGNOSIS — Z79899 Other long term (current) drug therapy: Secondary | ICD-10-CM | POA: Insufficient documentation

## 2013-02-07 DIAGNOSIS — M9080 Osteopathy in diseases classified elsewhere, unspecified site: Secondary | ICD-10-CM | POA: Insufficient documentation

## 2013-02-07 DIAGNOSIS — F17200 Nicotine dependence, unspecified, uncomplicated: Secondary | ICD-10-CM | POA: Insufficient documentation

## 2013-02-07 DIAGNOSIS — F411 Generalized anxiety disorder: Secondary | ICD-10-CM | POA: Insufficient documentation

## 2013-02-07 DIAGNOSIS — K219 Gastro-esophageal reflux disease without esophagitis: Secondary | ICD-10-CM | POA: Insufficient documentation

## 2013-02-07 DIAGNOSIS — I10 Essential (primary) hypertension: Secondary | ICD-10-CM | POA: Insufficient documentation

## 2013-02-07 DIAGNOSIS — E1069 Type 1 diabetes mellitus with other specified complication: Secondary | ICD-10-CM | POA: Insufficient documentation

## 2013-02-07 DIAGNOSIS — S98119A Complete traumatic amputation of unspecified great toe, initial encounter: Secondary | ICD-10-CM | POA: Insufficient documentation

## 2013-02-07 DIAGNOSIS — F329 Major depressive disorder, single episode, unspecified: Secondary | ICD-10-CM | POA: Insufficient documentation

## 2013-02-07 DIAGNOSIS — F3289 Other specified depressive episodes: Secondary | ICD-10-CM | POA: Insufficient documentation

## 2013-02-07 DIAGNOSIS — I8390 Asymptomatic varicose veins of unspecified lower extremity: Secondary | ICD-10-CM | POA: Insufficient documentation

## 2013-02-07 DIAGNOSIS — Z794 Long term (current) use of insulin: Secondary | ICD-10-CM | POA: Insufficient documentation

## 2013-02-07 DIAGNOSIS — M869 Osteomyelitis, unspecified: Secondary | ICD-10-CM | POA: Insufficient documentation

## 2013-02-08 ENCOUNTER — Telehealth: Payer: Self-pay | Admitting: *Deleted

## 2013-02-08 NOTE — Progress Notes (Signed)
[facility]  NAME:  [name], [name]              ACCOUNT NO.:  [id number]  MEDICAL RECORD NO.:  [id number]      DATE OF BIRTH:  1965/06/13  PHYSICIAN:  Theodoro Kos, DO       VISIT DATE:  02/07/2013                                  OFFICE VISIT   HISTORY OF PRESENT ILLNESS:  The patient is a 47 year old female who is here for evaluation of her left foot ulceration of her third and fifth toe.  She underwent amputation by Dr. Marlou Sa of her great toe on the left side last month.  That area has healed well.  She had films done on January 18, 2013, which showed some concerns for the third and fifth toe and third metatarsal concerning for osteomyelitis.  The patient is aware of this.  She is smoking.  PAST MEDICAL HISTORY:  Positive for: 1. Diabetes. 2. Tobacco dependence. 3. Hypertension. 4. Gastroesophageal reflux. 5. Irritable bowel. 6. Stress incontinence. 7. Anxiety and depression. 8. Seborrheic keratosis. 9. Facial lesion.  SURGERIES:  Left great toe amputation, colostomy and reversal, and rectal sphincter repair.  MEDICATIONS:  Cymbalta, NovoLog, Lantus, lisinopril, omeprazole, aspirin, Percocet.  ALLERGIES:  She is allergic to LATEX.  REVIEW OF SYSTEMS:  Otherwise, negative.  LABORATORY DATA:  Her hemoglobin A1c is elevated to 12.3.  PHYSICAL EXAMINATION:  GENERAL:  She is alert, oriented, and cooperative.  She does smell like tobacco. HEENT:  Pupils are equal.  Extraocular muscles intact.  No cervical lymphadenopathy.  She has some swollen glands on anterior throat area that is nontender. RESPIRATIONS:  Breathing is unlabored. CARDIAC:  Her heart rate is regular. ABDOMEN:  Soft. EXTREMITIES:  Lower extremity pulses present and equal.  She has some varicosities.  She has an open wound on the lateral aspect of her third toe and at the base of her fifth toe on the left with some swelling and some redness, no occult cellulitis.  This is very  concerning for osteomyelitis.  She does not have a great feeling due to her neuropathy.  PLAN:  For referral to Orthopedics for further evaluation for possible amputation.  We talked about the need for possible trans met amputation in order to salvage the lower portion of her left leg, and she is actually agreeable with that.  Additionally, multivitamin, vitamin C, zinc, protein via insure.  Elevation and cleaning with Dial soap, and we will use silver alginate for her dressings.     Theodoro Kos, DO     CS/MEDQ  D:  02/07/2013  T:  02/08/2013  Job:  [id number]

## 2013-02-17 ENCOUNTER — Encounter: Payer: Self-pay | Admitting: Vascular Surgery

## 2013-02-18 ENCOUNTER — Ambulatory Visit: Payer: Medicaid Other | Admitting: Vascular Surgery

## 2013-03-03 DIAGNOSIS — I10 Essential (primary) hypertension: Secondary | ICD-10-CM

## 2013-03-03 HISTORY — DX: Essential (primary) hypertension: I10

## 2013-03-07 ENCOUNTER — Encounter (HOSPITAL_COMMUNITY): Payer: Self-pay | Admitting: Respiratory Therapy

## 2013-03-08 ENCOUNTER — Encounter (HOSPITAL_COMMUNITY): Payer: Self-pay

## 2013-03-08 ENCOUNTER — Encounter (HOSPITAL_COMMUNITY)
Admission: RE | Admit: 2013-03-08 | Discharge: 2013-03-08 | Disposition: A | Discharge status: Home / Self Care | Payer: Medicaid Other | Source: Ambulatory Visit | Attending: Orthopedic Surgery | Admitting: Orthopedic Surgery

## 2013-03-08 LAB — CBC
HCT: 38.7 % (ref 36.0–46.0)
Hemoglobin: 12.8 g/dL (ref 12.0–15.0)
MCH: 28 pg (ref 26.0–34.0)
MCHC: 33.1 g/dL (ref 30.0–36.0)
MCV: 84.7 fL (ref 78.0–100.0)
Platelets: 319 10*3/uL (ref 150–400)
RBC: 4.57 MIL/uL (ref 3.87–5.11)
RDW: 14.3 % (ref 11.5–15.5)
WBC: 12 10*3/uL — ABNORMAL HIGH (ref 4.0–10.5)

## 2013-03-08 LAB — BASIC METABOLIC PANEL
BUN: 23 mg/dL (ref 6–23)
CO2: 26 mEq/L (ref 19–32)
Calcium: 9.4 mg/dL (ref 8.4–10.5)
Chloride: 98 mEq/L (ref 96–112)
Creatinine, Ser: 1.35 mg/dL — ABNORMAL HIGH (ref 0.50–1.10)
GFR calc Af Amer: 54 mL/min — ABNORMAL LOW (ref >90)
GFR calc non Af Amer: 47 mL/min — ABNORMAL LOW (ref >90)
Glucose, Bld: 182 mg/dL — ABNORMAL HIGH (ref 70–99)
Potassium: 4.7 mEq/L (ref 3.7–5.3)
Sodium: 136 mEq/L — ABNORMAL LOW (ref 137–147)

## 2013-03-08 LAB — HCG, SERUM, QUALITATIVE: Preg, Serum: NEGATIVE

## 2013-03-08 NOTE — Pre-Procedure Instructions (Signed)
[name]  03/08/2013   Your procedure is scheduled on:  Thursday, March 10, 2013 @ 2:15 PM  Report to [facility] Short Stay (use Main Entrance "A'') at 12:15 PM.  Call this number if you have problems the morning of surgery: [phone]   Remember:   Do not eat food or drink liquids after midnight.   Take these medicines the morning of surgery with A SIP OF WATER: DULoxetine (CYMBALTA) 30 MG capsule, omeprazole (PRILOSEC) 20 MG capsule Stop taking Aspirin, vitamins and herbal medications. Do not take any NSAIDs ie: Ibuprofen, Advil, Naproxen or any medication containing Aspirin.  Do not wear jewelry, make-up or nail polish.  Do not wear lotions, powders, or perfumes. You may wear deodorant.  Do not shave 48 hours prior to surgery.   Do not bring valuables to the hospital.  [facility] is not responsible for any belongings or valuables.               Contacts, dentures or bridgework may not be worn into surgery.  Leave suitcase in the car. After surgery it may be brought to your room.  For patients admitted to the hospital, discharge time is determined by your treatment team.               Patients discharged the day of surgery will not be allowed to drive home.  Name and phone number of your driver:  Special Instructions: Shower using CHG 2 nights before surgery and the night before surgery.  If you shower the day of surgery use CHG.  Use special wash - you have one bottle of CHG for all showers.  You should use approximately 1/3 of the bottle for each shower.   Please read over the following fact sheets that you were given: Pain Booklet, Coughing and Deep Breathing and Surgical Site Infection Prevention

## 2013-03-08 NOTE — Progress Notes (Signed)
Spoke to Dr. Glennon Mac re: patient being concerned  Being diabetic and surgery at 215 pm.  Instructed patient per Dr. Glennon Mac nothing to eat after MN, could potentially have clear liquid up to 8 hours prior to surgery but would prefer patient to come on in to  Hospital if blood sugar low or can call for instruction.

## 2013-03-09 ENCOUNTER — Other Ambulatory Visit: Payer: Self-pay | Admitting: Orthopedic Surgery

## 2013-03-09 MED ORDER — CEFAZOLIN SODIUM-DEXTROSE 2-3 GM-% IV SOLR
2.0000 g | INTRAVENOUS | Status: AC
  Administered 2013-03-10: 2 g via INTRAVENOUS
  Filled 2013-03-09: qty 50

## 2013-03-09 NOTE — Progress Notes (Signed)
Anesthesia Chart Review:  Patient is a 48 year old female posted for left Chopart amputation/tendon achilles lengthening on 03/10/13 by Dr. Doran Durand.  History includes smoking, DM1, GERD, HTN, left great toe amputation 08/2012, chest tightness in 11/2011 for which she saw cardiologist Dr. Peter Martinique and on 12/09/11 had a normal stress echo with occasional PVC which he felt were benign.  PRN cardiology follow-up was recommended. PCP is listed as Dr. Glennon Mac [place].  EKG on 03/08/13 showed NSR, possible anterior infarct.  CXR on 06/14/12 showed no acute cardiopulmonary abnormality.  Preoperative labs noted. She will get a fasting glucose on arrival.    Further evaluation by her assigned anesthesiologist on the day of surgery, but if no acute changes or new CV symptomology then I would anticipate that she could proceed as planned.  [facility] Short Stay Center/Anesthesiology Phone [phone] 03/09/2013 1:52 PM

## 2013-03-09 NOTE — H&P (Signed)
[name] is an 48 y.o. female.   Chief Complaint: Left foot infection HPI: Pt presents to OR for left Chopart amputation due to osteomyelitis of the left foot.  Pt has previous great toe amputation of left foot due to similar problem.  She now presents for further treatment due to inability to heal wound.  Ulcer formation on 3rd toe still persists and pt c/o of redness, warmth and swelling in left foot.  Pt has hx of peripheral neuropathy but denies N/V/F/C, chest pain, SOB, calf pain, or changes in appetite.  Past Medical History  Diagnosis Date  . Diabetes type 1, uncontrolled   . Chest tightness   . Urinary frequency   . Nausea and vomiting in adult   . Diabetic neuropathy   . Essential hypertension   . Yeast vaginitis   . GERD (gastroesophageal reflux disease)     Past Surgical History  Procedure Laterality Date  . Endometrial ablation    . Amputation Left 06/15/2012    Procedure: AMPUTATION DIGIT;  Surgeon: Meredith Pel, MD;  Location: [facility];  Service: Orthopedics;  Laterality: Left;  Left great toe revision amputation    Family History  Problem Relation Age of Onset  . Diabetes Mother   . Heart attack Mother   . Heart disease Mother     before age 16  . Hypertension Mother   . Hyperlipidemia Mother   . Diabetes Father   . Heart attack Father   . Heart disease Father     before age 7   Social History:  reports that she has been smoking Cigarettes.  She has a 11 pack-year smoking history. She has never used smokeless tobacco. She reports that she drinks alcohol. She reports that she does not use illicit drugs.  Allergies:  Allergies  Allergen Reactions  . Latex Rash    No prescriptions prior to admission    Results for orders placed during the hospital encounter of 03/08/13 (from the past 48 hour(s))  CBC     Status: Abnormal   Collection Time    03/08/13 10:28 AM      Result Value Range   WBC 12.0 (*) 4.0 - 10.5 K/uL   RBC 4.57  3.87 - 5.11  MIL/uL   Hemoglobin 12.8  12.0 - 15.0 g/dL   HCT 38.7  36.0 - 46.0 %   MCV 84.7  78.0 - 100.0 fL   MCH 28.0  26.0 - 34.0 pg   MCHC 33.1  30.0 - 36.0 g/dL   RDW 14.3  11.5 - 15.5 %   Platelets 319  150 - 400 K/uL  BASIC METABOLIC PANEL     Status: Abnormal   Collection Time    03/08/13 10:28 AM      Result Value Range   Sodium 136 (*) 137 - 147 mEq/L   Potassium 4.7  3.7 - 5.3 mEq/L   Chloride 98  96 - 112 mEq/L   CO2 26  19 - 32 mEq/L   Glucose, Bld 182 (*) 70 - 99 mg/dL   BUN 23  6 - 23 mg/dL   Creatinine, Ser 1.35 (*) 0.50 - 1.10 mg/dL   Calcium 9.4  8.4 - 10.5 mg/dL   GFR calc non Af Amer 47 (*) >90 mL/min   GFR calc Af Amer 54 (*) >90 mL/min   Comment: (NOTE)     The eGFR has been calculated using the CKD EPI equation.     This calculation has  not been validated in all clinical situations.     eGFR's persistently <90 mL/min signify possible Chronic Kidney     Disease.  HCG, SERUM, QUALITATIVE     Status: None   Collection Time    03/08/13 10:28 AM      Result Value Range   Preg, Serum NEGATIVE  NEGATIVE   Comment:            THE SENSITIVITY OF THIS     METHODOLOGY IS >10 mIU/mL.   No results found.  Review of Systems  Constitutional: Negative.   HENT: Negative.   Eyes: Negative.   Respiratory: Negative.   Cardiovascular: Negative.   Gastrointestinal: Negative.   Musculoskeletal: Negative.   Skin: Negative.   Neurological: Negative for loss of consciousness.  Endo/Heme/Allergies: Negative.   Psychiatric/Behavioral: The patient is not nervous/anxious.     There were no vitals taken for this visit. Physical Exam  Left foot has significant edema and erythema with large ulcers noted on the lateral aspect of the 3rd toe and another ulcer at the plantar medial aspect of the 5th toe.  Serous drainage noted on exam at ulcer sites.  +TTP at healed previous surgical site with mild fluctuance.  Assessment/Plan Pt presents to OR for Chopart amputation of the left foot  due to osteomyelitis.  The risks and benefits of the alternative treatment options have been discussed in detail.  The patient wishes to proceed with surgery and specifically understands risks of bleeding, infection, nerve damage, blood clots, need for additional surgery, amputation and death.    FLOWERS, CHRISTOPHER S 03/09/2013, 5:37 PM  The patient has extensive involvement of the 5th MT with osteomyelitis.  Transmetatarsal amputation carries a significant risk of recurrence since there is a high likelihood of residual osteomyelitis in the proximal fragment.  Taking the entire 5th MT will lead predictably to a varus malalignment.  As a result, BK or chopart amputation are better options.  The patient declines BKA despite the wider availability of good prosthetic options.  She understands risks of bleeding, infection, nerve damage, blood clots, revision amputation and death.

## 2013-03-10 ENCOUNTER — Encounter (HOSPITAL_COMMUNITY)
Admission: RE | Disposition: A | Discharge status: Home Health | Payer: Self-pay | Source: Ambulatory Visit | Attending: Orthopedic Surgery

## 2013-03-10 ENCOUNTER — Ambulatory Visit (HOSPITAL_COMMUNITY): Payer: Medicaid Other | Admitting: Critical Care Medicine

## 2013-03-10 ENCOUNTER — Encounter (HOSPITAL_COMMUNITY): Payer: Self-pay | Admitting: *Deleted

## 2013-03-10 ENCOUNTER — Observation Stay (HOSPITAL_COMMUNITY)
Admission: RE | Admit: 2013-03-10 | Discharge: 2013-03-11 | Disposition: A | Discharge status: Home Health | Payer: Medicaid Other | Source: Ambulatory Visit | Attending: Orthopedic Surgery | Admitting: Orthopedic Surgery

## 2013-03-10 ENCOUNTER — Encounter (HOSPITAL_COMMUNITY): Payer: Medicaid Other | Admitting: Vascular Surgery

## 2013-03-10 DIAGNOSIS — E1169 Type 2 diabetes mellitus with other specified complication: Principal | ICD-10-CM | POA: Insufficient documentation

## 2013-03-10 DIAGNOSIS — Z01810 Encounter for preprocedural cardiovascular examination: Secondary | ICD-10-CM | POA: Insufficient documentation

## 2013-03-10 DIAGNOSIS — S98119A Complete traumatic amputation of unspecified great toe, initial encounter: Secondary | ICD-10-CM | POA: Insufficient documentation

## 2013-03-10 DIAGNOSIS — M9080 Osteopathy in diseases classified elsewhere, unspecified site: Secondary | ICD-10-CM | POA: Insufficient documentation

## 2013-03-10 DIAGNOSIS — F411 Generalized anxiety disorder: Secondary | ICD-10-CM | POA: Insufficient documentation

## 2013-03-10 DIAGNOSIS — E1142 Type 2 diabetes mellitus with diabetic polyneuropathy: Secondary | ICD-10-CM | POA: Insufficient documentation

## 2013-03-10 DIAGNOSIS — Z794 Long term (current) use of insulin: Secondary | ICD-10-CM | POA: Insufficient documentation

## 2013-03-10 DIAGNOSIS — F3289 Other specified depressive episodes: Secondary | ICD-10-CM | POA: Insufficient documentation

## 2013-03-10 DIAGNOSIS — Z01812 Encounter for preprocedural laboratory examination: Secondary | ICD-10-CM | POA: Insufficient documentation

## 2013-03-10 DIAGNOSIS — K219 Gastro-esophageal reflux disease without esophagitis: Secondary | ICD-10-CM | POA: Insufficient documentation

## 2013-03-10 DIAGNOSIS — L97509 Non-pressure chronic ulcer of other part of unspecified foot with unspecified severity: Secondary | ICD-10-CM | POA: Insufficient documentation

## 2013-03-10 DIAGNOSIS — M869 Osteomyelitis, unspecified: Secondary | ICD-10-CM | POA: Diagnosis present

## 2013-03-10 DIAGNOSIS — F329 Major depressive disorder, single episode, unspecified: Secondary | ICD-10-CM | POA: Insufficient documentation

## 2013-03-10 DIAGNOSIS — E1149 Type 2 diabetes mellitus with other diabetic neurological complication: Secondary | ICD-10-CM | POA: Insufficient documentation

## 2013-03-10 DIAGNOSIS — F17200 Nicotine dependence, unspecified, uncomplicated: Secondary | ICD-10-CM | POA: Insufficient documentation

## 2013-03-10 DIAGNOSIS — I10 Essential (primary) hypertension: Secondary | ICD-10-CM | POA: Insufficient documentation

## 2013-03-10 DIAGNOSIS — I739 Peripheral vascular disease, unspecified: Secondary | ICD-10-CM | POA: Insufficient documentation

## 2013-03-10 HISTORY — PX: ACHILLES TENDON SURGERY: SHX542

## 2013-03-10 LAB — GLUCOSE, CAPILLARY
Glucose-Capillary: 123 mg/dL — ABNORMAL HIGH (ref 70–99)
Glucose-Capillary: 138 mg/dL — ABNORMAL HIGH (ref 70–99)
Glucose-Capillary: 180 mg/dL — ABNORMAL HIGH (ref 70–99)
Glucose-Capillary: 183 mg/dL — ABNORMAL HIGH (ref 70–99)
Glucose-Capillary: 273 mg/dL — ABNORMAL HIGH (ref 70–99)

## 2013-03-10 SURGERY — REPAIR, TENDON, ACHILLES
Anesthesia: General | Site: Foot | Laterality: Left

## 2013-03-10 MED ORDER — OXYCODONE-ACETAMINOPHEN 5-325 MG PO TABS
ORAL_TABLET | ORAL | Status: AC
  Administered 2013-03-10: 2 via ORAL
  Filled 2013-03-10: qty 2

## 2013-03-10 MED ORDER — SODIUM CHLORIDE 0.9 % IV SOLN
INTRAVENOUS | Status: DC
Start: 2013-03-10 — End: 2013-03-11
  Administered 2013-03-11: 03:00:00 via INTRAVENOUS

## 2013-03-10 MED ORDER — LISINOPRIL 20 MG PO TABS
20.0000 mg | ORAL_TABLET | Freq: Every day | ORAL | Status: DC
  Administered 2013-03-10 – 2013-03-11 (×2): 20 mg via ORAL
  Filled 2013-03-10 (×3): qty 1

## 2013-03-10 MED ORDER — ACETAMINOPHEN 500 MG PO TABS
ORAL_TABLET | ORAL | Status: AC
  Filled 2013-03-10: qty 2

## 2013-03-10 MED ORDER — ONDANSETRON HCL 4 MG/2ML IJ SOLN: 4.0000 mg | Freq: Four times a day (QID) | INTRAMUSCULAR | Status: DC | PRN

## 2013-03-10 MED ORDER — INSULIN ASPART PROT & ASPART (70-30 MIX) 100 UNIT/ML ~~LOC~~ SUSP
22.0000 [IU] | Freq: Three times a day (TID) | SUBCUTANEOUS | Status: DC
  Filled 2013-03-10: qty 10

## 2013-03-10 MED ORDER — DOCUSATE SODIUM 100 MG PO CAPS
100.0000 mg | ORAL_CAPSULE | Freq: Two times a day (BID) | ORAL | Status: DC
  Administered 2013-03-10: 100 mg via ORAL
  Filled 2013-03-10 (×3): qty 1

## 2013-03-10 MED ORDER — OXYCODONE HCL 5 MG PO TABS: 5.0000 mg | ORAL_TABLET | Freq: Once | ORAL | Status: DC | PRN

## 2013-03-10 MED ORDER — CHLORHEXIDINE GLUCONATE 4 % EX LIQD: 60.0000 mL | Freq: Once | CUTANEOUS | Status: DC

## 2013-03-10 MED ORDER — SODIUM CHLORIDE 0.9 % IR SOLN
Status: DC | PRN
  Administered 2013-03-10: 3000 mL

## 2013-03-10 MED ORDER — ONDANSETRON HCL 4 MG/2ML IJ SOLN
INTRAMUSCULAR | Status: DC | PRN
  Administered 2013-03-10: 4 mg via INTRAVENOUS

## 2013-03-10 MED ORDER — INSULIN GLARGINE 100 UNIT/ML ~~LOC~~ SOLN
40.0000 [IU] | Freq: Every day | SUBCUTANEOUS | Status: DC
  Administered 2013-03-10: 40 [IU] via SUBCUTANEOUS
  Filled 2013-03-10 (×3): qty 0.4

## 2013-03-10 MED ORDER — HYDROMORPHONE HCL PF 1 MG/ML IJ SOLN
0.5000 mg | INTRAMUSCULAR | Status: DC | PRN
  Administered 2013-03-10 – 2013-03-11 (×3): 1 mg via INTRAVENOUS
  Filled 2013-03-10 (×3): qty 1

## 2013-03-10 MED ORDER — ONDANSETRON HCL 4 MG PO TABS: 4.0000 mg | ORAL_TABLET | Freq: Four times a day (QID) | ORAL | Status: DC | PRN

## 2013-03-10 MED ORDER — METOCLOPRAMIDE HCL 10 MG PO TABS: 5.0000 mg | ORAL_TABLET | Freq: Three times a day (TID) | ORAL | Status: DC | PRN

## 2013-03-10 MED ORDER — 0.9 % SODIUM CHLORIDE (POUR BTL) OPTIME
TOPICAL | Status: DC | PRN
  Administered 2013-03-10: 1000 mL

## 2013-03-10 MED ORDER — HYDROMORPHONE HCL PF 1 MG/ML IJ SOLN
INTRAMUSCULAR | Status: AC
  Administered 2013-03-10: 0.5 mg via INTRAVENOUS
  Filled 2013-03-10: qty 1

## 2013-03-10 MED ORDER — MORPHINE SULFATE 2 MG/ML IJ SOLN
1.0000 mg | INTRAMUSCULAR | Status: DC | PRN
  Administered 2013-03-10: 1 mg via INTRAVENOUS
  Filled 2013-03-10: qty 1

## 2013-03-10 MED ORDER — ONDANSETRON HCL 4 MG/2ML IJ SOLN: 4.0000 mg | Freq: Once | INTRAMUSCULAR | Status: DC | PRN

## 2013-03-10 MED ORDER — ALPRAZOLAM 0.5 MG PO TABS
0.5000 mg | ORAL_TABLET | Freq: Every evening | ORAL | Status: DC | PRN
  Administered 2013-03-10: 0.5 mg via ORAL
  Filled 2013-03-10 (×3): qty 1

## 2013-03-10 MED ORDER — INSULIN ASPART 100 UNIT/ML ~~LOC~~ SOLN: 0.0000 [IU] | Freq: Three times a day (TID) | SUBCUTANEOUS | Status: DC

## 2013-03-10 MED ORDER — ENOXAPARIN SODIUM 40 MG/0.4ML ~~LOC~~ SOLN
40.0000 mg | SUBCUTANEOUS | Status: DC
  Administered 2013-03-11: 40 mg via SUBCUTANEOUS
  Filled 2013-03-10 (×2): qty 0.4

## 2013-03-10 MED ORDER — OXYCODONE HCL 5 MG PO TABS
5.0000 mg | ORAL_TABLET | ORAL | Status: DC | PRN
  Administered 2013-03-10 – 2013-03-11 (×4): 10 mg via ORAL
  Filled 2013-03-10 (×4): qty 2

## 2013-03-10 MED ORDER — FENTANYL CITRATE 0.05 MG/ML IJ SOLN
INTRAMUSCULAR | Status: DC | PRN
  Administered 2013-03-10 (×5): 50 ug via INTRAVENOUS
  Administered 2013-03-10: 100 ug via INTRAVENOUS
  Administered 2013-03-10: 50 ug via INTRAVENOUS
  Administered 2013-03-10 (×4): 25 ug via INTRAVENOUS

## 2013-03-10 MED ORDER — HYDROMORPHONE HCL PF 1 MG/ML IJ SOLN
0.2500 mg | INTRAMUSCULAR | Status: DC | PRN
  Administered 2013-03-10 (×4): 0.5 mg via INTRAVENOUS

## 2013-03-10 MED ORDER — LACTATED RINGERS IV SOLN
INTRAVENOUS | Status: DC
  Administered 2013-03-10: 13:00:00 via INTRAVENOUS

## 2013-03-10 MED ORDER — METHOCARBAMOL 500 MG PO TABS
500.0000 mg | ORAL_TABLET | Freq: Four times a day (QID) | ORAL | Status: DC | PRN
  Administered 2013-03-10 – 2013-03-11 (×2): 500 mg via ORAL
  Filled 2013-03-10 (×2): qty 1

## 2013-03-10 MED ORDER — MIDAZOLAM HCL 5 MG/5ML IJ SOLN
INTRAMUSCULAR | Status: DC | PRN
  Administered 2013-03-10: 2 mg via INTRAVENOUS

## 2013-03-10 MED ORDER — OXYCODONE HCL 5 MG/5ML PO SOLN: 5.0000 mg | Freq: Once | ORAL | Status: DC | PRN

## 2013-03-10 MED ORDER — CEFAZOLIN SODIUM-DEXTROSE 2-3 GM-% IV SOLR
2.0000 g | Freq: Four times a day (QID) | INTRAVENOUS | Status: AC
  Administered 2013-03-10 – 2013-03-11 (×3): 2 g via INTRAVENOUS
  Filled 2013-03-10 (×4): qty 50

## 2013-03-10 MED ORDER — SENNA 8.6 MG PO TABS
1.0000 | ORAL_TABLET | Freq: Two times a day (BID) | ORAL | Status: DC
  Administered 2013-03-10: 8.6 mg via ORAL
  Filled 2013-03-10 (×4): qty 1

## 2013-03-10 MED ORDER — OXYCODONE-ACETAMINOPHEN 5-325 MG PO TABS
2.0000 | ORAL_TABLET | ORAL | Status: DC | PRN
  Administered 2013-03-10 – 2013-03-11 (×4): 2 via ORAL
  Filled 2013-03-10 (×3): qty 2

## 2013-03-10 MED ORDER — DULOXETINE HCL 30 MG PO CPEP
30.0000 mg | ORAL_CAPSULE | Freq: Every day | ORAL | Status: DC
  Administered 2013-03-11: 30 mg via ORAL
  Filled 2013-03-10: qty 1

## 2013-03-10 MED ORDER — ACETAMINOPHEN 500 MG PO TABS
1000.0000 mg | ORAL_TABLET | Freq: Once | ORAL | Status: AC
  Administered 2013-03-10: 1000 mg via ORAL

## 2013-03-10 MED ORDER — LIDOCAINE HCL (CARDIAC) 20 MG/ML IV SOLN
INTRAVENOUS | Status: DC | PRN
  Administered 2013-03-10: 100 mg via INTRAVENOUS

## 2013-03-10 MED ORDER — MEPERIDINE HCL 25 MG/ML IJ SOLN: 6.2500 mg | INTRAMUSCULAR | Status: DC | PRN

## 2013-03-10 MED ORDER — BACITRACIN ZINC 500 UNIT/GM EX OINT
TOPICAL_OINTMENT | CUTANEOUS | Status: DC | PRN
  Administered 2013-03-10: 1 via TOPICAL

## 2013-03-10 MED ORDER — SODIUM CHLORIDE 0.9 % IV SOLN: INTRAVENOUS | Status: DC

## 2013-03-10 MED ORDER — METOCLOPRAMIDE HCL 5 MG/ML IJ SOLN: 5.0000 mg | Freq: Three times a day (TID) | INTRAMUSCULAR | Status: DC | PRN

## 2013-03-10 MED ORDER — PANTOPRAZOLE SODIUM 40 MG PO TBEC
40.0000 mg | DELAYED_RELEASE_TABLET | Freq: Every day | ORAL | Status: DC
  Administered 2013-03-10 – 2013-03-11 (×2): 40 mg via ORAL
  Filled 2013-03-10 (×2): qty 1

## 2013-03-10 MED ORDER — PROPOFOL 10 MG/ML IV BOLUS
INTRAVENOUS | Status: DC | PRN
  Administered 2013-03-10: 150 mg via INTRAVENOUS
  Administered 2013-03-10: 50 mg via INTRAVENOUS

## 2013-03-10 SURGICAL SUPPLY — 55 items
BANDAGE ESMARK 6X9 LF (GAUZE/BANDAGES/DRESSINGS) ×1 IMPLANT
BANDAGE GAUZE ELAST BULKY 4 IN (GAUZE/BANDAGES/DRESSINGS) ×1 IMPLANT
BLADE SURG 10 STRL SS (BLADE) ×2 IMPLANT
BNDG CMPR 9X6 STRL LF SNTH (GAUZE/BANDAGES/DRESSINGS) ×1
BNDG COHESIVE 4X5 TAN STRL (GAUZE/BANDAGES/DRESSINGS) ×2 IMPLANT
BNDG COHESIVE 6X5 TAN STRL LF (GAUZE/BANDAGES/DRESSINGS) ×2 IMPLANT
BNDG ESMARK 6X9 LF (GAUZE/BANDAGES/DRESSINGS) ×2
CHLORAPREP W/TINT 26ML (MISCELLANEOUS) ×2 IMPLANT
CLOTH BEACON ORANGE TIMEOUT ST (SAFETY) ×2 IMPLANT
COVER SURGICAL LIGHT HANDLE (MISCELLANEOUS) ×2 IMPLANT
CUFF TOURNIQUET SINGLE 34IN LL (TOURNIQUET CUFF) ×2 IMPLANT
CUFF TOURNIQUET SINGLE 44IN (TOURNIQUET CUFF) IMPLANT
DRAPE U-SHAPE 47X51 STRL (DRAPES) ×2 IMPLANT
DRSG ADAPTIC 3X8 NADH LF (GAUZE/BANDAGES/DRESSINGS) ×1 IMPLANT
DRSG PAD ABDOMINAL 8X10 ST (GAUZE/BANDAGES/DRESSINGS) ×1 IMPLANT
ELECT REM PT RETURN 9FT ADLT (ELECTROSURGICAL) ×2
ELECTRODE REM PT RTRN 9FT ADLT (ELECTROSURGICAL) ×1 IMPLANT
GLOVE BIO SURGEON STRL SZ7 (GLOVE) ×2 IMPLANT
GLOVE BIO SURGEON STRL SZ8 (GLOVE) ×2 IMPLANT
GLOVE BIOGEL PI IND STRL 7.5 (GLOVE) ×1 IMPLANT
GLOVE BIOGEL PI IND STRL 8 (GLOVE) ×1 IMPLANT
GLOVE BIOGEL PI INDICATOR 7.5 (GLOVE) ×1
GLOVE BIOGEL PI INDICATOR 8 (GLOVE) ×1
GOWN PREVENTION PLUS XLARGE (GOWN DISPOSABLE) ×2 IMPLANT
GOWN STRL NON-REIN LRG LVL3 (GOWN DISPOSABLE) ×4 IMPLANT
HANDPIECE INTERPULSE COAX TIP (DISPOSABLE)
KIT BASIN OR (CUSTOM PROCEDURE TRAY) ×2 IMPLANT
KIT ROOM TURNOVER OR (KITS) ×2 IMPLANT
MANIFOLD NEPTUNE II (INSTRUMENTS) ×2 IMPLANT
NEEDLE 22X1 1/2 (OR ONLY) (NEEDLE) IMPLANT
NS IRRIG 1000ML POUR BTL (IV SOLUTION) ×2 IMPLANT
PACK ORTHO EXTREMITY (CUSTOM PROCEDURE TRAY) ×2 IMPLANT
PAD ARMBOARD 7.5X6 YLW CONV (MISCELLANEOUS) ×4 IMPLANT
PAD CAST 4YDX4 CTTN HI CHSV (CAST SUPPLIES) ×1 IMPLANT
PADDING CAST ABS 4INX4YD NS (CAST SUPPLIES) ×2
PADDING CAST ABS COTTON 4X4 ST (CAST SUPPLIES) IMPLANT
PADDING CAST COTTON 4X4 STRL (CAST SUPPLIES) ×2
PADDING CAST COTTON 6X4 STRL (CAST SUPPLIES) ×1 IMPLANT
SET HNDPC FAN SPRY TIP SCT (DISPOSABLE) IMPLANT
SOAP 2 % CHG 4 OZ (WOUND CARE) ×2 IMPLANT
SPONGE GAUZE 4X4 12PLY (GAUZE/BANDAGES/DRESSINGS) ×1 IMPLANT
SPONGE LAP 18X18 X RAY DECT (DISPOSABLE) ×2 IMPLANT
SUCTION FRAZIER TIP 10 FR DISP (SUCTIONS) ×2 IMPLANT
SUT ETHILON 3 0 PS 1 (SUTURE) ×3 IMPLANT
SUT MNCRL AB 3-0 PS2 18 (SUTURE) ×5 IMPLANT
SUT PDS AB 1 CT  36 (SUTURE) ×1
SUT PDS AB 1 CT 36 (SUTURE) IMPLANT
SUT PROLENE 3 0 PS 2 (SUTURE) IMPLANT
SUT VIC AB 3-0 PS2 18 (SUTURE)
SUT VIC AB 3-0 PS2 18XBRD (SUTURE) ×1 IMPLANT
SYR CONTROL 10ML LL (SYRINGE) IMPLANT
TOWEL OR 17X24 6PK STRL BLUE (TOWEL DISPOSABLE) ×2 IMPLANT
TOWEL OR 17X26 10 PK STRL BLUE (TOWEL DISPOSABLE) ×2 IMPLANT
TUBE CONNECTING 12X1/4 (SUCTIONS) ×2 IMPLANT
WATER STERILE IRR 1000ML POUR (IV SOLUTION) ×2 IMPLANT

## 2013-03-10 NOTE — Anesthesia Procedure Notes (Signed)
Procedure Name: LMA Insertion Date/Time: 03/10/2013 2:35 PM Performed by: Carola Frost Pre-anesthesia Checklist: Patient identified, Timeout performed, Emergency Drugs available, Suction available and Patient being monitored Patient Re-evaluated:Patient Re-evaluated prior to inductionOxygen Delivery Method: Circle system utilized Preoxygenation: Pre-oxygenation with 100% oxygen Intubation Type: IV induction LMA: LMA inserted LMA Size: 4.0 Number of attempts: 1 Placement Confirmation: breath sounds checked- equal and bilateral and positive ETCO2 Tube secured with: Tape Dental Injury: Teeth and Oropharynx as per pre-operative assessment

## 2013-03-10 NOTE — Brief Op Note (Signed)
03/10/2013  4:03 PM  PATIENT:  [name]  48 y.o. female  PRE-OPERATIVE DIAGNOSIS:  LEFT FOOT OSTEOMYELITIS  POST-OPERATIVE DIAGNOSIS:  LEFT FOOT OSTEOMYELITIS  Procedure(s): 1.  LEFT CHOPART AMPUTATION 2.  Left percutaneous achilles tenotomy  SURGEON:  Wylene Simmer, MD  ASSISTANT: Nicki Reaper Flowers, PA-C  ANESTHESIA:   General  EBL:  minimal   TOURNIQUET:  40 min at [id number] mm Hg  COMPLICATIONS:  None apparent  DISPOSITION:  Extubated, awake and stable to recovery.  DICTATION ID:  [id number]

## 2013-03-10 NOTE — Preoperative (Signed)
Beta Blockers   Reason not to administer Beta Blockers:Not Applicable 

## 2013-03-10 NOTE — Transfer of Care (Signed)
Immediate Anesthesia Transfer of Care Note  Patient: [name]  Procedure(s) Performed: Procedure(s): LEFT CHOPART AMPUTATION/ LEFT TENDON ACHILLES [place] [place] (Left)  Patient Location: PACU  Anesthesia Type:General  Level of Consciousness: awake, alert  and oriented  Airway & Oxygen Therapy: Patient Spontanous Breathing and Patient connected to nasal cannula oxygen  Post-op Assessment: Report given to PACU RN, Post -op Vital signs reviewed and stable and Patient moving all extremities X 4  Post vital signs: Reviewed and stable  Complications: No apparent anesthesia complications

## 2013-03-10 NOTE — Anesthesia Postprocedure Evaluation (Signed)
  Anesthesia Post-op Note  Patient: [name]  Procedure(s) Performed: Procedure(s): LEFT CHOPART AMPUTATION/ LEFT TENDON ACHILLES [place] (Left)  Patient Location: PACU  Anesthesia Type:General  Level of Consciousness: awake  Airway and Oxygen Therapy: Patient Spontanous Breathing  Post-op Pain: moderate  Post-op Assessment: Post-op Vital signs reviewed, Patient's Cardiovascular Status Stable, Respiratory Function Stable, Patent Airway, No signs of Nausea or vomiting and Pain level controlled  Post-op Vital Signs: Reviewed and stable  Complications: No apparent anesthesia complications

## 2013-03-10 NOTE — Anesthesia Preprocedure Evaluation (Addendum)
Anesthesia Evaluation  Patient identified by MRN, date of birth, ID band Patient awake    Reviewed: Allergy & Precautions, H&P , NPO status , Patient's Chart, lab work & pertinent test results  Airway Mallampati: I TM Distance: >3 FB Neck ROM: Full    Dental  (+) Dental Advisory Given   Pulmonary Current Smoker,          Cardiovascular hypertension, Pt. on medications + Peripheral Vascular Disease     Neuro/Psych PSYCHIATRIC DISORDERS Anxiety Depression    GI/Hepatic GERD-  Medicated,  Endo/Other  diabetes, Well Controlled, Type 2, Insulin Dependent  Renal/GU      Musculoskeletal   Abdominal   Peds  Hematology   Anesthesia Other Findings   Reproductive/Obstetrics                        Anesthesia Physical Anesthesia Plan  ASA: II  Anesthesia Plan: General   Post-op Pain Management:    Induction: Intravenous  Airway Management Planned: LMA  Additional Equipment:   Intra-op Plan:   Post-operative Plan: Extubation in OR  Informed Consent: I have reviewed the patients History and Physical, chart, labs and discussed the procedure including the risks, benefits and alternatives for the proposed anesthesia with the patient or authorized representative who has indicated his/her understanding and acceptance.     Plan Discussed with: CRNA and Surgeon  Anesthesia Plan Comments:         Anesthesia Quick Evaluation

## 2013-03-11 LAB — GLUCOSE, CAPILLARY
Glucose-Capillary: 107 mg/dL — ABNORMAL HIGH (ref 70–99)
Glucose-Capillary: 179 mg/dL — ABNORMAL HIGH (ref 70–99)

## 2013-03-11 MED ORDER — OXYCODONE HCL 5 MG PO TABS: 5.0000 mg | ORAL_TABLET | ORAL | Status: DC | PRN

## 2013-03-11 MED ORDER — RIVAROXABAN 10 MG PO TABS: 10.0000 mg | ORAL_TABLET | Freq: Every day | ORAL | Status: DC

## 2013-03-11 NOTE — Op Note (Signed)
[name], [name] NO.:  [id number]  MEDICAL RECORD NO.:  [id number]  LOCATION:  5N27C                        FACILITY:  [facility]  PHYSICIAN:  Wylene Simmer, MD        DATE OF BIRTH:  [birth year]  DATE OF PROCEDURE:  03/10/2013 DATE OF DISCHARGE:                              OPERATIVE REPORT   PREOPERATIVE DIAGNOSIS:  Left foot osteomyelitis.  POSTOPERATIVE DIAGNOSIS:  Left foot osteomyelitis.  PROCEDURE: 1. Left foot Chopart amputation. 2. Left percutaneous Achilles tenotomy.  SURGEON:  Wylene Simmer, MD  ASSISTANT:  Nicki Reaper Flowers, PA-C.  ANESTHESIA:  General.  ESTIMATED BLOOD LOSS:  Minimal.  TOURNIQUET TIME:  40 minutes at 250 mmHg.  COMPLICATIONS:  None apparent.  DISPOSITION:  Extubated awake and stable to recovery.  INDICATIONS FOR PROCEDURE:  The patient is a 48 year old female with past medical history significant for smoking and diabetes.  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  She healed reasonably well from that, but developed ulcer at the third toe. X-rays revealed osteomyelitis of the 5th metatarsal as well as the third toe.  She presents now for Chopart amputation.  She is not a candidate for transmetatarsal amputation since the 5th metatarsal would require complete excision leading to a progressive varus deformity.  Chopart amputation is the preferred way to achieve a balanced hindfoot.  She understands the risks and benefits, alternative treatment options, and elects surgical treatment.  She specifically understands risks of bleeding, infection, nerve damage, blood clots, need for additional surgery, revision, amputation, and death.  PROCEDURE IN DETAIL:  After preoperative consent was obtained, the correct operative site was identified.  The patient was brought to the operating room and placed supine on the operating table.  General anesthesia was induced.  Preoperative antibiotics were  administered. Surgical time-out was taken.  Left lower extremity was prepped and draped in standard sterile fashion with a tourniquet around the thigh. The extremity was exsanguinated.  The tourniquet was inflated to 250 mmHg.  A plantar flap incision was marked on the skin at the level of the transverse tarsal joint.  A #15 blade was used to make a stab incision in the central portion of the Achilles.  The Achilles was then transected in its entirety.  Attention was then turned to the previously marked incision, which was then made and sharp dissection was carried down through the skin and subcutaneous tissue to the level of the bone.  The talonavicular and calcaneocuboid joints were disarticulated.  The plantar soft tissue was dissected subperiosteally under the metatarsals.  The foot was passed off the field as a specimen to Pathology.  Deep tissue was obtained from the third toe ulcer and sent to Microbiology for aerobic and anaerobic culture.  The named arteries and veins were all cauterized.  The extensor hallucis longus and tibialis anterior tendons were sutured to the periosteum of the talar neck with #1 PDS horizontal mattress sutures.  The wound was irrigated copiously.  The subcutaneous tissue was approximated with inverted simple sutures of 3-0 Monocryl and horizontal mattress sutures of 3-0 nylon were used to close the skin incision.  Sterile dressings were  applied followed by a well-padded short-leg splint.  Tourniquet was released at 40 minutes after application of the dressings.  The patient was then awakened from anesthesia and transported to the recovery room in stable condition.  FOLLOWUP PLAN:  The patient will be nonweightbearing on the left lower extremity until she follows up with me in 2 weeks in clinic.  She will be observed overnight for pain control and 24 hours of IV antibiotics. She will have Physical Therapy consultation for  nonweightbearing, ambulation, and likely be discharged home tomorrow.  Scott Flowers, PA-C was present and scrubbed for the duration of the case.  His assistance was substantial in gaining and maintaining exposure, performing the operation, closing the wounds, applying the splint.     Wylene Simmer, MD     JH/MEDQ  D:  03/10/2013  T:  03/11/2013  Job:  [id number]

## 2013-03-11 NOTE — Progress Notes (Signed)
Subjective: 1 Day Post-Op Procedure(s) (LRB): LEFT CHOPART AMPUTATION/ LEFT TENDON ACHILLES LENGTHING (Left) Patient doing well this AM.  Pain well controlled, appetite unchanged, able to stand with assistance and transfer to toilet and back to bed.  Pt denies N/V/F/C, chest pain, SOB, or calf pain.     Objective: Vital signs in last 24 hours: Temp:  [98.1 F (36.7 C)-98.8 F (37.1 C)] 98.4 F (36.9 C) (01/09 0546) Pulse Rate:  [72-90] 74 (01/09 0546) Resp:  [11-28] 18 (01/09 0546) BP: (132-171)/(57-82) 134/66 mmHg (01/09 0546) SpO2:  [97 %-100 %] 98 % (01/09 0546)  Intake/Output from previous day: 01/08 0701 - 01/09 0700 In: 2336.7 [P.O.:1480; I.V.:756.7; IV Piggyback:100] Out: 27 [Urine:2; Blood:25] Intake/Output this shift:     Recent Labs  03/08/13 1028  HGB 12.8    Recent Labs  03/08/13 1028  WBC 12.0*  RBC 4.57  HCT 38.7  PLT 319    Recent Labs  03/08/13 1028  NA 136*  K 4.7  CL 98  CO2 26  BUN 23  CREATININE 1.35*  GLUCOSE 182*  CALCIUM 9.4   No results found for this basename: LABPT, INR,  in the last 72 hours  WD WN female, NAD, A/Ox3, appears stated age.  EOMI, mood and affect normal, respirations unlabored.  On physical exam dressing is c/d/i, in proper placement and in good repair.  No TTP to lower leg proximal to splint.  Knee flexion to 110 degrees, knee extension to 0 degrees.  Assessment/Plan: 1 Day Post-Op Procedure(s) (LRB): LEFT CHOPART AMPUTATION/ LEFT TENDON ACHILLES LENGTHING (Left) I had detailed discussion with pt regarding continued care during her post op period.  Today pt will undergo PT/OT evaluation for determination for home care.  She will remain NWB on the left leg and begin taking Xarelto 10mg  once a day for the next 2 weeks for anticoagulation.  I have written a prescription for a rolling knee scooter for assistance with ambulation.  Pt will f/u with Dr. Doran Durand in 2 weeks for splint removal and evaluation of surgical site.    Amos Micheals S 03/11/2013, 8:03 AM

## 2013-03-11 NOTE — Evaluation (Addendum)
Occupational Therapy Evaluation Patient Details Name: GRACELYNNE KUCHAR MRN: [id number] DOB: 23-Nov-1965 Today's Date: 03/11/2013 Time: [id number] OT Time Calculation (min): 17 min  OT Assessment / Plan / Recommendation History of present illness Pt. admitted with osteomyelitis L foot and underwent Chopart amputation.     Clinical Impression   Pt admitted with above. Pt overall min guard-supervision assist with ADLs and functional mobility. Pt reports she has necessary DME at home already.  Education completed. No further acute OT needed.    OT Assessment  Patient does not need any further OT services    Follow Up Recommendations  No OT follow up;Supervision/Assistance - 24 hour    Barriers to Discharge      Equipment Recommendations  None recommended by OT    Recommendations for Other Services    Frequency       Precautions / Restrictions Precautions Precautions: Fall Restrictions Weight Bearing Restrictions: Yes LLE Weight Bearing: Non weight bearing   Pertinent Vitals/Pain See vitals    ADL  Eating/Feeding: Performed;Independent Where Assessed - Eating/Feeding: Chair Grooming: Performed;Supervision/safety;Wash/dry hands Where Assessed - Grooming: Supported standing Toilet Transfer: Performed;Min Psychiatric nurse Method: Sit to Loss adjuster, chartered: Comfort height toilet;Grab bars Electrical engineer and Hygiene: Performed;Min guard Where Assessed - Best boy and Hygiene: Standing Transfers/Ambulation Related to ADLs: min guard with RW ADL Comments: Educated pt on use of shower chair and sequencing of tub transfer (sit on shower chair and pivot hips around to bring LEs into tub). Simulated tub transfer in room.  Educated pt on performing LB ADLs sitting EOB or in chair.  Also educated pt on safely transporting ADL items using a bag or tray on RW.    OT Diagnosis:    OT Problem List:   OT Treatment Interventions:      OT Goals(Current goals can be found in the care plan section) Acute Rehab OT Goals Patient Stated Goal:  (goals not set, pt with DC orders home)  Visit Information  Last OT Received On: 03/11/13 Assistance Needed: +1 History of Present Illness: Pt. admitted with osteomyelitis L foot and underwent Chopart amputation.         Prior [place] expects to be discharged to:: Private residence Living Arrangements: Children;Non-relatives/Friends Available Help at Discharge: Family;Friend(s);Available 24 hours/day Type of Home: House Home Access: Stairs to enter [employer] of Steps: 2 Entrance Stairs-Rails: None Home Layout: One level Home Equipment: Bedside commode;Walker - 2 wheels;Grab bars - tub/shower;Shower seat Prior Function Level of Independence: Independent Communication Communication: No difficulties         Vision/Perception     Cognition  Cognition Arousal/Alertness: Awake/alert Behavior During Therapy: WFL for tasks assessed/performed Overall Cognitive Status: Within Functional Limits for tasks assessed    Extremity/Trunk Assessment Upper Extremity Assessment Upper Extremity Assessment: Overall WFL for tasks assessed Lower Extremity Assessment Lower Extremity Assessment: Overall WFL for tasks assessed (L foot not tested but otherwise grossly WFL) Cervical / Trunk Assessment Cervical / Trunk Assessment: Normal     Mobility  Transfers Overall transfer level: Needs assistance Equipment used: Rolling walker (2 wheeled) Transfers: Sit to/from Stand Sit to Stand: Min guard      Exercise     Balance   End of Session OT - End of Session Equipment Utilized During Treatment: Rolling walker Activity Tolerance: Patient tolerated treatment well Patient left: in chair;with call bell/phone within reach Nurse Communication: Mobility status  GO Functional Assessment Tool Used: clinical  judgement Functional  Limitation: Self care Self Care Current Status [phone]): At least 1 percent but less than 20 percent impaired, limited or restricted Self Care Goal Status [id number]): At least 1 percent but less than 20 percent impaired, limited or restricted Self Care Discharge Status [phone]): At least 1 percent but less than 20 percent impaired, limited or restricted  03/11/2013 Darrol Jump OTR/L Pager [phone] Office [phone]  Darrol Jump 03/11/2013, 12:00 PM

## 2013-03-11 NOTE — Evaluation (Signed)
Physical Therapy Evaluation Patient Details Name: [name] MRN: [id number] DOB: 16-May-1965 Today's Date: 03/11/2013 Time:  -     PT Assessment / Plan / Recommendation History of Present Illness  Pt. admitted with osteomyelitis L foot and underwent Chopart amputation.    Clinical Impression  Pt. Presents to PT s/p Chopart amputation of L foot and is now NWB status.  She was able to manage knee walker well and can ascend and descent 2 steps with use of RW and 2 assist while maintaining NWB status.  Educated pt. On need for second person to assist to maximize safety as she negotiate steps to get into home and best/safest techniques and placement for assist.  She has DC orders home and all PT education completed.  She says she has 24 hour assist.  See below for equipment and f/u recommendations.  Will sign off as Dc orders written.    PT Assessment  All further PT needs can be met in the next venue of care    Follow Up Recommendations  Home health PT;Supervision/Assistance - 24 hour;Supervision for mobility/OOB    Does the patient have the potential to tolerate intense rehabilitation      Barriers to Discharge        Equipment Recommendations  Wheelchair (measurements PT);Wheelchair cushion (measurements PT);Other (comment) (knee walker)    Recommendations for Other Services     Frequency      Precautions / Restrictions Precautions Precautions: Fall Restrictions Weight Bearing Restrictions: Yes LLE Weight Bearing: Non weight bearing   Pertinent Vitals/Pain See vitals tab       Mobility  Bed Mobility Overal bed mobility: Modified Independent General bed mobility comments: managing self in transition up to edge of bed with increased time Transfers Overall transfer level: Needs assistance General transfer comment: Pt. initially needed min guard assist for safety but no physical assist.  After several trials of up and down from chair, she was at supervision level .   Instructed in good hand placement technique and she was able to follow through. Ambulation/Gait Ambulation/Gait assistance: Min guard Ambulation Distance (Feet): 100 Feet (100' to gym, then 100' back to room) Assistive device: Rolling walker (2 wheeled) (then knee walker) Gait Pattern/deviations: Step-to pattern Gait velocity: decreased wtih use of RW, improved with use of knee walker General Gait Details: Pt. needed instruction to slow down with use of RW and to keep RW closer to her for improved safety as she tended to place RW too far forward.  WIth use of knee walker, pt. had good technique and appeared steady with no overt LOB noted.   Stairs: Yes Stairs assistance: +2 safety/equipment Stair Management: Backwards;With walker Number of Stairs: 2 General stair comments: Pt. needed min assist for safety and stability and second person to manage RW up and down steps and for bracing RW.  Pt. says she can have 2 people to assist her , one for RW management and a second person to assist in stabilizing her.      Exercises     PT Diagnosis: Difficulty walking  PT Problem List: Decreased balance;Decreased mobility;Decreased knowledge of use of DME;Decreased knowledge of precautions;Pain PT Treatment Interventions:       PT Goals(Current goals can be found in the care plan section) Acute Rehab PT Goals Patient Stated Goal:  (goals not set, pt with DC orders home)  Visit Information  Last PT Received On: 03/11/13 Assistance Needed: +1 History of Present Illness: Pt. admitted with osteomyelitis L foot  and underwent Chopart amputation.         Prior Functioning  Home Living Family/patient expects to be discharged to:: Private residence Living Arrangements: Non-relatives/Friends;Children (friend Santiago Glad and 71 year old daughter) Available Help at Discharge: Family;Friend(s);Available 24 hours/day Type of Home: House Home Access: Stairs to enter [employer] of Steps: 2 Entrance  Stairs-Rails: None Home Layout: One level Home Equipment: Bedside commode;Walker - 2 wheels;Grab bars - tub/shower Prior Function Level of Independence: Independent Communication Communication: No difficulties    Cognition  Cognition Arousal/Alertness: Awake/alert Behavior During Therapy: WFL for tasks assessed/performed Overall Cognitive Status: Within Functional Limits for tasks assessed    Extremity/Trunk Assessment Upper Extremity Assessment Upper Extremity Assessment: Overall WFL for tasks assessed Lower Extremity Assessment Lower Extremity Assessment: Overall WFL for tasks assessed (L foot not tested but otherwise grossly WFL) Cervical / Trunk Assessment Cervical / Trunk Assessment: Normal   Balance Balance Overall balance assessment: Needs assistance Sitting-balance support: No upper extremity supported Sitting balance-Leahy Scale: Normal Standing balance support: Bilateral upper extremity supported;During functional activity Standing balance-Leahy Scale: Good  End of Session PT - End of Session Equipment Utilized During Treatment: Gait belt Activity Tolerance: Patient tolerated treatment well Patient left: in chair;with call bell/phone within reach Nurse Communication: Mobility status;Other (comment) (discussed equipment and HHPT needs with CM Stanton Kidney)  GP Functional Assessment Tool Used: clinical observation Functional Limitation: Mobility: Walking and moving around Mobility: Walking and Moving Around Current Status [phone]): At least 20 percent but less than 40 percent impaired, limited or restricted Mobility: Walking and Moving Around Goal Status [phone]): At least 20 percent but less than 40 percent impaired, limited or restricted Mobility: Walking and Moving Around Discharge Status [phone]): At least 20 percent but less than 40 percent impaired, limited or restricted   Ladona Ridgel 03/11/2013, 10:53 AM Gerlean Ren PT Acute Rehab Services [phone] [place]  [phone]

## 2013-03-11 NOTE — Progress Notes (Signed)
03/11/13 PT recommended HHPT, spoke with patient, she chose [facility]. Contacted Sheniya Garciaperez with [facility] and set up HHPT. Patient states that she has a rolling walker and a 3N1 at home. Contacted Darian with [facility] and requested wheelchair to be delivered to patient's room prior to discharge. Fuller Plan RN, BSN, CCM

## 2013-03-11 NOTE — Discharge Instructions (Signed)
John Hewitt, MD °[facility] ° °Please read the following information regarding your care after surgery. ° °Medications  °You only need a prescription for the narcotic pain medicine (ex. oxycodone, Percocet, Norco).  All of the other medicines listed below are available over the counter. °X acetominophen (Tylenol) 650 mg every 4-6 hours as you need for minor pain °X oxycodone as prescribed for moderate to severe pain ° °Narcotic pain medicine (ex. oxycodone, Percocet, Vicodin) will cause constipation.  To prevent this problem, take the following medicines while you are taking any pain medicine. °X docusate sodium (Colace) 100 mg twice a day X senna (Senokot) 2 tablets twice a day ° °X To help prevent blood clots, take Xarelto 10mg once a day for 2 weeks after surgery.  You should also get up every hour while you are awake to move around.   ° °Weight Bearing °X Do not bear any weight on the operated leg or foot. ° °Cast / Splint / Dressing °X Keep your splint or cast clean and dry.  Don’t put anything (coat hanger, pencil, etc) down inside of it.  If it gets damp, use a hair dryer on the cool setting to dry it.  If it gets soaked, call the office to schedule an appointment for a cast change.  ° °After your dressing, cast or splint is removed; you may shower, but do not soak or scrub the wound.  Allow the water to run over it, and then gently pat it dry. ° °Swelling °It is normal for you to have swelling where you had surgery.  To reduce swelling and pain, keep your toes above your nose for at least 3 days after surgery.  It may be necessary to keep your foot or leg elevated for several weeks.  If it hurts, it should be elevated. ° °Follow Up °Call my office at [phone] when you are discharged from the hospital or surgery center to schedule an appointment to be seen two weeks after surgery. ° °Call my office at [phone] if you develop a fever >101.5° F, nausea, vomiting, bleeding from the surgical  site or severe pain.   ° ° ° °

## 2013-03-13 LAB — TISSUE CULTURE

## 2013-03-14 ENCOUNTER — Encounter (HOSPITAL_COMMUNITY): Payer: Self-pay | Admitting: Orthopedic Surgery

## 2013-03-14 NOTE — Addendum Note (Signed)
Addendum created 03/14/13 1607 by Lillia Abed, MD   Modules edited: Anesthesia Attestations, Anesthesia Events

## 2013-03-15 LAB — ANAEROBIC CULTURE

## 2013-03-16 NOTE — Discharge Summary (Signed)
Physician Discharge Summary  Patient ID: [name] MRN: [id number] DOB/AGE: 46-Dec-1967 46 y.o.  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  On 03/10/2013 pt was brought to the OR for left BKA.  Pt understood risks and benefits of surgery and agreed to proceed with procedure.  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 Doran Durand without complication.  Pt was recovered in the PACU and then transferred to the floor for further post operative care.  Pt underwent PT and OT evaluation for rehab.  On 03/11/2013 all vital signs were stable and pt was appropriate for discharge.  Pt was discharged and will f/u with Dr. Doran Durand in 2 weeks.  Consults: none  Significant Diagnostic Studies: none  Treatments: surgery: as above  Discharge Exam: Blood pressure 134/66, pulse 74, temperature 98.4 F (36.9 C), temperature source Oral, resp. rate 18, SpO2 98.00%. WD WN female, NAD, A/Ox3, appears stated age. EOMI, mood and affect normal, respirations unlabored. On physical exam dressing is c/d/i, in proper placement and in good repair. No TTP to lower leg proximal to splint. Knee flexion to 110 degrees, knee extension to 0 degrees.  Disposition: 01-Home or Self Care  Discharge Orders   Future Appointments Provider Department Dept Phone   01/20/2014 9:00 AM [facility] [place] [facility] [address] [phone]   01/20/2014 9:40 AM Sharmon Leyden Nickel, NP [facility] [phone]   Future Orders Complete By Expires   Call MD / Call 911  As directed    Comments:     If you experience chest pain or shortness of breath, CALL 911 and be transported to the  hospital emergency room.  If you develope a fever above 101 F, pus (white drainage) or increased drainage or redness at the wound, or calf pain, call your surgeon's office.   Constipation Prevention  As directed    Comments:     Drink plenty of fluids.  Prune juice may be helpful.  You may use a stool softener, such as Colace (over the counter) 100 mg twice a day.  Use MiraLax (over the counter) for constipation as needed.   Diet - low sodium heart healthy  As directed    Driving restrictions  As directed    Comments:     No driving for 6 weeks   Increase activity slowly as tolerated  As directed    Lifting restrictions  As directed    Comments:     No lifting for 6 weeks       Medication List         ACCU-CHEK AVIVA PLUS test strip  Generic drug:  glucose blood  USE TO CHECK BLOOD SUGAR BEFORE MEALS AND AT BEDTIME     DULoxetine 30 MG capsule  Commonly known as:  CYMBALTA  Take 1 capsule (30 mg total) by mouth daily.     insulin aspart protamine- aspart (70-30) 100 UNIT/ML injection  Commonly known as:  NOVOLOG MIX 70/30  Inject 22-24 Units into the skin 3 (three) times daily with meals.     insulin glargine 100 UNIT/ML injection  Commonly known as:  LANTUS  Inject 0.4 mLs (40 Units total) into the skin daily.  lisinopril 20 MG tablet  Commonly known as:  PRINIVIL,ZESTRIL  Take 1 tablet (20 mg total) by mouth daily.     omeprazole 20 MG capsule  Commonly known as:  PRILOSEC  Take 20 mg by mouth daily.     ONE TOUCH ULTRA SYSTEM KIT W/DEVICE Kit  1 kit by Does not apply route once.     oxyCODONE 5 MG immediate release tablet  Commonly known as:  Oxy IR/ROXICODONE  Take 1-2 tablets (5-10 mg total) by mouth every 4 (four) hours as needed for moderate pain, severe pain or breakthrough pain.     oxyCODONE-acetaminophen 5-325 MG per tablet  Commonly known as:  PERCOCET/ROXICET  Take 2 tablets by mouth every 4 (four) hours as needed for moderate pain or severe pain.      rivaroxaban 10 MG Tabs tablet  Commonly known as:  XARELTO  Take 1 tablet (10 mg total) by mouth daily.           Follow-up Information   Follow up with Wylene Simmer, MD. Call in 2 weeks.   Specialty:  Orthopedic Surgery   Contact information:   [address] [place] [address] [phone]       Follow up with [facility]. (physical therapy, they will contact you)    Contact information:   [address] [place] [address] [phone]       Signed: Trula Ore 03/16/2013, 11:25 AM

## 2013-03-21 ENCOUNTER — Telehealth: Payer: Self-pay | Admitting: Home Health Services

## 2013-03-21 NOTE — Telephone Encounter (Signed)
Left message for patient to schedule follow up appointment for diabetic care including ldl testing.

## 2013-05-16 ENCOUNTER — Other Ambulatory Visit: Payer: Self-pay | Admitting: *Deleted

## 2013-05-17 MED ORDER — GLUCOSE BLOOD VI STRP: ORAL_STRIP | Status: DC

## 2013-05-23 ENCOUNTER — Encounter (HOSPITAL_BASED_OUTPATIENT_CLINIC_OR_DEPARTMENT_OTHER): Payer: Medicaid Other | Attending: Plastic Surgery

## 2013-06-13 ENCOUNTER — Encounter (HOSPITAL_BASED_OUTPATIENT_CLINIC_OR_DEPARTMENT_OTHER): Payer: Medicaid Other

## 2013-06-17 ENCOUNTER — Telehealth: Payer: Self-pay | Admitting: *Deleted

## 2013-06-17 NOTE — Telephone Encounter (Signed)
Phone call to patient to inquire about the Novolog FlexPen 100 Unit/ML. Received request from [facility]. Medication is not listed on current medication list. Per patient has been taking FlexPen for years. Per Dr. Thomes Dinning medication will be refilled and patient needs to follow up regarding diabetes. Appointment scheduled for 06/23/2013 at 11:00 AM. Patient stated she understood.   Derl Barrow, RN

## 2013-06-20 ENCOUNTER — Encounter (HOSPITAL_BASED_OUTPATIENT_CLINIC_OR_DEPARTMENT_OTHER): Payer: Medicaid Other | Attending: Plastic Surgery

## 2013-06-20 DIAGNOSIS — L97509 Non-pressure chronic ulcer of other part of unspecified foot with unspecified severity: Secondary | ICD-10-CM | POA: Insufficient documentation

## 2013-06-20 DIAGNOSIS — E1169 Type 2 diabetes mellitus with other specified complication: Secondary | ICD-10-CM | POA: Insufficient documentation

## 2013-06-23 ENCOUNTER — Ambulatory Visit (INDEPENDENT_AMBULATORY_CARE_PROVIDER_SITE_OTHER): Payer: Medicaid Other | Admitting: Family Medicine

## 2013-06-23 ENCOUNTER — Encounter: Payer: Self-pay | Admitting: Family Medicine

## 2013-06-23 VITALS — BP 124/77 | HR 84 | Temp 98.2°F | Ht 70.0 in | Wt 218.0 lb

## 2013-06-23 DIAGNOSIS — F411 Generalized anxiety disorder: Secondary | ICD-10-CM

## 2013-06-23 DIAGNOSIS — E1140 Type 2 diabetes mellitus with diabetic neuropathy, unspecified: Secondary | ICD-10-CM

## 2013-06-23 DIAGNOSIS — E1065 Type 1 diabetes mellitus with hyperglycemia: Secondary | ICD-10-CM

## 2013-06-23 DIAGNOSIS — IMO0002 Reserved for concepts with insufficient information to code with codable children: Secondary | ICD-10-CM

## 2013-06-23 DIAGNOSIS — E1142 Type 2 diabetes mellitus with diabetic polyneuropathy: Secondary | ICD-10-CM

## 2013-06-23 DIAGNOSIS — F419 Anxiety disorder, unspecified: Secondary | ICD-10-CM

## 2013-06-23 DIAGNOSIS — E1149 Type 2 diabetes mellitus with other diabetic neurological complication: Secondary | ICD-10-CM

## 2013-06-23 DIAGNOSIS — E119 Type 2 diabetes mellitus without complications: Secondary | ICD-10-CM

## 2013-06-23 LAB — POCT GLYCOSYLATED HEMOGLOBIN (HGB A1C): Hemoglobin A1C: 7.6

## 2013-06-23 MED ORDER — ALPRAZOLAM 0.5 MG PO TABS
0.5000 mg | ORAL_TABLET | Freq: Every evening | ORAL | Status: DC | PRN
Start: 2013-06-23 — End: 2013-07-26

## 2013-06-23 MED ORDER — GABAPENTIN 300 MG PO CAPS
300.0000 mg | ORAL_CAPSULE | Freq: Three times a day (TID) | ORAL | Status: DC
Start: 2013-06-23 — End: 2014-01-30

## 2013-06-23 NOTE — Progress Notes (Signed)
Family Medicine Office Visit Note   Subjective:   Patient ID: [name], female  DOB: 02-01-1966, 48 y.o.. MRN: [id number]   Pt that comes today for DM follow up. She recently underwent R metatarsal amputation secondary to osteomyelitis. She is doing better from this procedure, awaiting to heal properly so she can start trial of prothesis.   Regarding her DM she reports using Lantus 40u daily and adjusting novolog per SSI. She is watching more her diet and reports better glycemic control. Denies hypoglycemic events or hyperglycemic symptoms.   Her complaint today is her weight gain and neuropathic pain. She reports ~ 30lb weight gain and attributes this to the use of insulin, although her exercise levels has decrease due to her amputation. She is also not taking her antidepressant and has increase in her depression symptoms of sadness, anhedonia and low energy level, denies suicidal ideation or plans. She also reports increase level of anxiety and extreme preoccupation for her health.   Regarding neuropathic pain, she reports has been on gabapentin for this in the past, but recently in low dose that does not seem to be working.   Review of Systems:  Pt denies SOB, chest pain, palpitations, headaches, dizziness, numbness or weakness. No changes on urinary or BM habits. Unintentional weight gain of 27 lb in one year.  Objective:   Physical Exam: Gen:  NAD HEENT: Moist mucous membranes  CV: Regular rate and rhythm, no murmurs rubs or gallops PULM: Clear to auscultation bilaterally. No wheezes/rales/rhonchi ABD: Soft, non tender, non distended, normal bowel sounds EXT: left foot amputated up to metatarsus   Neuro: Alert and oriented x3. No focalization  Assessment & Plan:

## 2013-06-23 NOTE — Patient Instructions (Signed)
Please restart Duloxetine for depression. You can take Alprazolan as needed for anxiety. I have placed a referral to Endocrinology, you will be called with details of this appointment.  We changed gabapentin dose, take it as prescribed.  F/u in 3 months or sooner if needed.

## 2013-06-24 ENCOUNTER — Telehealth: Payer: Self-pay | Admitting: Family Medicine

## 2013-06-24 DIAGNOSIS — F419 Anxiety disorder, unspecified: Secondary | ICD-10-CM | POA: Insufficient documentation

## 2013-06-24 MED ORDER — DULOXETINE HCL 30 MG PO CPEP: 30.0000 mg | ORAL_CAPSULE | Freq: Every day | ORAL | Status: DC

## 2013-06-24 NOTE — Assessment & Plan Note (Signed)
Now A1C is 7.6. The best it has been in a while. Pt on Lantus and Novolog. Now she reports insulin is the responsible for her wait gain and desires to get evaluated by endocrinology in order to have available other options for DM control. She refuses to go back with our DM clinic with our Pharmacist.  Will discuss option with Dr. Valentina Lucks and a referral for Endocrinology has been placed since pt's compliance has been an issue in the past and tight glucose control is needed in order to minimize DM complications.

## 2013-06-24 NOTE — Telephone Encounter (Signed)
Spoke with patient and informed her of below message

## 2013-06-24 NOTE — Telephone Encounter (Signed)
Cymbalta has been called in. Related to [name] I need to discus this with our Pharmacist since pt refuses to make an appointment with him to discuss her DM management. She needs to continue with current dose of Lantus and Novlog until I contact her. Please let pt know. Thank you so much

## 2013-06-24 NOTE — Assessment & Plan Note (Signed)
Off her cymbalta. GAD scored 11.  PHQ-9 scored 12 with 0 on question 9 Will restart cymbalta. Xanax PRN to bridge antidepressant and f/u in 1 month or sooner if needed

## 2013-06-24 NOTE — Assessment & Plan Note (Signed)
Not taking cymbalta and on low dose gabapentin.  Will restart cymbalta and gabapentin has been increased to 300mg  TID

## 2013-06-24 NOTE — Telephone Encounter (Signed)
Did not get cymbalta called in. The other one--victoza?--was not called in either Please advise

## 2013-07-04 ENCOUNTER — Encounter (HOSPITAL_BASED_OUTPATIENT_CLINIC_OR_DEPARTMENT_OTHER): Payer: Medicaid Other | Attending: Plastic Surgery

## 2013-07-04 ENCOUNTER — Encounter: Payer: Self-pay | Admitting: Endocrinology

## 2013-07-04 ENCOUNTER — Ambulatory Visit (INDEPENDENT_AMBULATORY_CARE_PROVIDER_SITE_OTHER): Payer: Medicaid Other | Admitting: Endocrinology

## 2013-07-04 VITALS — BP 160/60 | HR 87 | Temp 98.7°F | Ht 70.0 in | Wt 221.4 lb

## 2013-07-04 DIAGNOSIS — S98919A Complete traumatic amputation of unspecified foot, level unspecified, initial encounter: Secondary | ICD-10-CM | POA: Insufficient documentation

## 2013-07-04 DIAGNOSIS — L97509 Non-pressure chronic ulcer of other part of unspecified foot with unspecified severity: Secondary | ICD-10-CM | POA: Insufficient documentation

## 2013-07-04 DIAGNOSIS — E1140 Type 2 diabetes mellitus with diabetic neuropathy, unspecified: Secondary | ICD-10-CM

## 2013-07-04 DIAGNOSIS — E1142 Type 2 diabetes mellitus with diabetic polyneuropathy: Secondary | ICD-10-CM

## 2013-07-04 DIAGNOSIS — E1149 Type 2 diabetes mellitus with other diabetic neurological complication: Secondary | ICD-10-CM

## 2013-07-04 DIAGNOSIS — L97529 Non-pressure chronic ulcer of other part of left foot with unspecified severity: Secondary | ICD-10-CM

## 2013-07-04 DIAGNOSIS — E785 Hyperlipidemia, unspecified: Secondary | ICD-10-CM

## 2013-07-04 MED ORDER — INSULIN ASPART 100 UNIT/ML FLEXPEN: PEN_INJECTOR | SUBCUTANEOUS | Status: DC

## 2013-07-04 MED ORDER — ACCU-CHEK AVIVA PLUS W/DEVICE KIT: PACK | Status: DC

## 2013-07-04 NOTE — Progress Notes (Signed)
Patient ID: [name], female   DOB: 18-Apr-1965, 48 y.o.   MRN: [id number]    Reason for Appointment: Consultation for Type 2 Diabetes  History of Present Illness:          Diagnosis: Type 2 diabetes mellitus, date of diagnosis: 1992        Past history: She had symptomatic hyperglycemia at onset and was treated with metformin and Amaryl. However because of GI side effects from metformin this was not continued  And she thinks she was continued on Amaryl for several years. Subsequently in 2002 when she was pregnant she was treated with insulin  After her pregnancy insulin was stopped and she was back on oral agents Insulin was started again about 3 years ago because of poor control. She does not think she tried any other medications She has been on various insulin regimens in the last 3 years including NovoLog, Novolin 70/30, NovoLog mix 70/30 and Lantus  In 9/14 because of tendency to weight gain she was given a sample of Byetta 5 mcg to try but she could not tolerate this for over 2 weeks because of significant nausea. Has not tried Victoza or any other GLP-1 drugs previously She has had persistently poor control in the past with A1c at least 10% since about 2012  Recent history:  She has been taking NovoLog mix 70/30 since about 06/2012 along with Lantus Her overall control appears to have been consistently poor until a few months ago She has been given higher doses of insulin but she thinks that in the last 5 months she has gained about 50 pounds This is despite her trying to cut back on portions and carbohydrates She thinks she is taking her insulin consistently before each meal and as directed and will adjust the dose based on her blood sugar level and may take less for smaller meals She did not bring her monitor for download as it was not working today but she thinks she is checking her sugar 2-4 times a day Hypoglycemia: This tends to occur after supper, about 2-3 times a week and  usually within 2 hours of her meals. May have blood sugars in the 50s. Today also got hypoglycemic after lunch     Because of her difficulty with getting her weight down and options for treatment she is being referred for further management      Oral hypoglycemic drugs the patient is taking are: None      Side effects from medications have been: Metformin INSULIN regimen is described as: NovoLog mix 12-20 ac tid Lantus 40 hs  Glucose monitoring:  done one time a day         Glucometer:  Accu-Chek Aviva plus   Blood Glucose readings from recall  PREMEAL Breakfast Lunch Dinner Bedtime  Overall   Glucose range: 110-127 120 120 160   Median:       Glycemic control:  Lab Results  Component Value Date   HGBA1C 7.6 06/23/2013   HGBA1C 12.3 08/04/2012   HGBA1C >14.0 02/19/2012   Lab Results  Component Value Date   CREATININE 1.35* 03/08/2013    Self-care: The diet that the patient has been following is: tries to limit carbohydrates. At breakfast she will have eggs and toast, usually will have a meat protein, cheese or egg at lunchtime usually no carbohydrate, dinner has a mixed meal with some carbohydrate. Usually avoiding high fat meals and fried food. Eating outside infrequently  Meals: 3 meals per day. Dinner 5-6 pm         Exercise:  not able to recently because of foot ulcer         Dietician visit: Most recent: At diagnosis.               Compliance with the medical regimen: Good Retinal exam: Most recent:.2014     Weight history: Previous range 170-218  Wt Readings from Last 3 Encounters:  07/04/13 221 lb 6.4 oz (100.426 kg)  06/23/13 218 lb (98.884 kg)  03/08/13 205 lb 1.6 oz (93.033 kg)      Medication List       This list is accurate as of: 07/04/13  3:55 PM.  Always use your most recent med list.               ALPRAZolam 0.5 MG tablet  Commonly known as:  XANAX  Take 1 tablet (0.5 mg total) by mouth at bedtime as needed for anxiety.     DULoxetine 30 MG  capsule  Commonly known as:  CYMBALTA  Take 1 capsule (30 mg total) by mouth daily.     gabapentin 300 MG capsule  Commonly known as:  NEURONTIN  Take 1 capsule (300 mg total) by mouth 3 (three) times daily.     glucose blood test strip  Commonly known as:  ACCU-CHEK AVIVA PLUS  USE TO CHECK BLOOD SUGAR BEFORE MEALS AND AT BEDTIME     insulin aspart protamine- aspart (70-30) 100 UNIT/ML injection  Commonly known as:  NOVOLOG MIX 70/30  Inject 22-24 Units into the skin 3 (three) times daily with meals.     insulin glargine 100 UNIT/ML injection  Commonly known as:  LANTUS  Inject 0.4 mLs (40 Units total) into the skin daily.     lisinopril 20 MG tablet  Commonly known as:  PRINIVIL,ZESTRIL  Take 1 tablet (20 mg total) by mouth daily.     omeprazole 20 MG capsule  Commonly known as:  PRILOSEC  Take 20 mg by mouth daily.     ONE TOUCH ULTRA SYSTEM KIT W/DEVICE Kit  1 kit by Does not apply route once.        Allergies:  Allergies  Allergen Reactions  . Latex Rash    Past Medical History  Diagnosis Date  . Diabetes type 1, uncontrolled   . Chest tightness   . Urinary frequency   . Nausea and vomiting in adult   . Diabetic neuropathy   . Essential hypertension   . Yeast vaginitis   . GERD (gastroesophageal reflux disease)     Past Surgical History  Procedure Laterality Date  . Endometrial ablation    . Amputation Left 06/15/2012    Procedure: AMPUTATION DIGIT;  Surgeon: Meredith Pel, MD;  Location: [facility];  Service: Orthopedics;  Laterality: Left;  Left great toe revision amputation  . Achilles tendon surgery Left 03/10/2013    Procedure: LEFT CHOPART AMPUTATION/ LEFT TENDON ACHILLES [place];  Surgeon: Wylene Simmer, MD;  Location: [facility];  Service: Orthopedics;  Laterality: Left;    Family History  Problem Relation Age of Onset  . Diabetes Mother   . Heart attack Mother   . Heart disease Mother     before age 77  . Hypertension Mother   . Hyperlipidemia  Mother   . Diabetes Father   . Heart attack Father   . Heart disease Father     before age 18  Social History:  reports that she has been smoking Cigarettes.  She has a 11 pack-year smoking history. She has never used smokeless tobacco. She reports that she drinks alcohol. She reports that she does not use illicit drugs.    Review of Systems       Lipids: No recent levels available and has never been on medications      Lab Results  Component Value Date   LDLDIRECT 106* 11/20/2011       Skin: No rash or infections     Thyroid:  No  unusual fatigue.     The blood pressure has been high for 2-3 years      No swelling of feet.     No shortness of breath on exertion.     Bowel habits: Has history of irritable bowel disorder. No nausea except for medications      No joint  pains.      She has had a history of anxiety and  depression       She has had a diabetic foot ulcer requiring amputation of the left great toe, currently followed by wound clinic         No history of Numbness in her feet. Does have tingling , stinging sensation and sharp pains as well as burning in feet. Has had symptoms for about a year. She has been taking gabapentin for the last month or so without much relief. Also 5 days ago was started on Cymbalta which she is taking with evening meal but it causes nausear   LABS:  No visits with results within 1 Week(s) from this visit. Latest known visit with results is:  Office Visit on 06/23/2013  Component Date Value Ref Range Status  . Hemoglobin A1C 06/23/2013 7.6   Final    Physical Examination:  BP 160/60  Pulse 87  Temp(Src) 98.7 F (37.1 C) (Oral)  Ht '5\' 10"'  (1.778 m)  Wt 221 lb 6.4 oz (100.426 kg)  BMI 31.77 kg/m2  SpO2 94%  GENERAL:         Patient has generalized obesity.   HEENT:         Eye exam shows normal external appearance. Fundus exam shows no retinopathy. Oral exam shows normal mucosa .  NECK:         General:  Neck exam  shows no lymphadenopathy. Carotids are normal to palpation and no bruit heard.  Thyroid is not enlarged and no nodules felt.   LUNGS:         Chest is symmetrical. Lungs are clear to auscultation.Marland Kitchen   HEART:         Heart sounds:  S1 and S2 are normal. No murmurs or clicks heard., no S3 or S4.   ABDOMEN:   There is no distention present. Liver and spleen are not palpable. No other mass or tenderness present.  EXTREMITIES:     There is no edema. No skin lesions present.Marland Kitchen  NEUROLOGICAL:   Vibration sense is absent distally in toes of the right foot. Ankle jerks are absent on the right, decreased at the biceps.       Diabetic foot exam:  as described in the foot exam section MUSCULOSKELETAL:       There is no enlargement or deformity of the joints. Spine is normal to inspection.Marland Kitchen   SKIN:       No rash or pigmentation    ASSESSMENT:  Diabetes type 2 with obesity  Although she has had  a history of persistently poor control she has had relatively good blood sugars in the last few weeks and also is significantly improved A1c of 7.6% in 06/2013 She is taking a moderate amount of insulin of about 1 unit per kilogram with multiple injections Although her home monitor is not available for review she thinks her blood sugars are consistently near normal even after meals Also she is having some tendency to hypoglycemia after her evening meal or occasionally after lunch Currently not an insulin sensitizer Her main difficulty is with progressive weight gain with improving her glycemic control with insulin alone. She also has a history of GI intolerance to metformin and also Byetta  Complications: Peripheral neuropathy with sensory loss and history of diabetic foot ulcer  History of hypercholesterolemia: This needs to be reevaluated and because of her family history of CAD she should be on a statin drug  Hypertension: Appears to be having high reading today, has been treated by PCP  PLAN:   Trial of  Invokana if her renal function is normal. She is intolerant to metformin and this should be approved by her insurance. She will start with 100 mg and may increase to 300 mg if needed.  Switch NovoLog mix insulin to NovoLog before meals, will start with the equivalent doses of what she is taking now, generally 6 units before breakfast and supper and at lunchtime if she is eating any carbohydrate  She will need to monitor her blood sugars consistently including after meals and have glucose monitor downloaded for review  Lantus dose to be adjusted based on fasting blood sugars, may need to increase this to 44 units since she will not be taking premixed insulin in the evening. Instructions given to patient for dosage adjustment every 3-4 days  May increase Lantus to twice a day if blood sugars at suppertime are significantly high  Continue followup with wound clinic for foot care and also podiatry follow up regularly. She should have custom molded shoes to prevent ulcers  New prescription sent for NovoLog and Accu-Chek Aviva glucose monitor  Treatment of neuropathy: Recommend that she increase her gabapentin to 600 mg 3 times a day for better pain relief  Total visit time including counseling = 60 minutes  Elayne Snare 07/04/2013, 3:55 PM   She

## 2013-07-04 NOTE — Patient Instructions (Signed)
Please check blood sugars before meals and at least once a day 2 hours after any meal and at least once every other day on waking up.  Please bring blood sugar monitor to each visit  NovoLog insulin: This replaces NovoLog mix insulin  Take 6 units before breakfast and supper. May adjust the dose between 5-8 units depending on carbohydrate intake. If the blood sugar is going up over 150 after the meal may increase the dose by one to 2 units before that meal  Take NovoLog at lunchtime only if eating any carbohydrate  LANTUS: Continue taking 40 units but if morning sugars are consistently over 130 may go up to 44 units  Invokana: take this before breakfast daily, will notify you if it is okay to start

## 2013-07-05 LAB — COMPREHENSIVE METABOLIC PANEL
ALT: 14 U/L (ref 0–35)
AST: 20 U/L (ref 0–37)
Albumin: 3.9 g/dL (ref 3.5–5.2)
Alkaline Phosphatase: 74 U/L (ref 39–117)
BUN: 27 mg/dL — ABNORMAL HIGH (ref 6–23)
CO2: 23 mEq/L (ref 19–32)
Calcium: 9.4 mg/dL (ref 8.4–10.5)
Chloride: 106 mEq/L (ref 96–112)
Creatinine, Ser: 1.6 mg/dL — ABNORMAL HIGH (ref 0.4–1.2)
GFR: 37.4 mL/min — ABNORMAL LOW (ref >60.00)
Glucose, Bld: 101 mg/dL — ABNORMAL HIGH (ref 70–99)
Potassium: 4.1 mEq/L (ref 3.5–5.1)
Sodium: 140 mEq/L (ref 135–145)
Total Bilirubin: 0 mg/dL — ABNORMAL LOW (ref 0.2–1.2)
Total Protein: 7.6 g/dL (ref 6.0–8.3)

## 2013-07-05 LAB — LIPID PANEL
Cholesterol: 195 mg/dL (ref 0–200)
HDL: 29.5 mg/dL — ABNORMAL LOW (ref >39.00)
LDL Cholesterol: 58 mg/dL (ref 0–99)
Total CHOL/HDL Ratio: 7
Triglycerides: 538 mg/dL — ABNORMAL HIGH (ref 0.0–149.0)
VLDL: 107.6 mg/dL — ABNORMAL HIGH (ref 0.0–40.0)

## 2013-07-05 LAB — URINALYSIS, ROUTINE W REFLEX MICROSCOPIC
Bilirubin Urine: NEGATIVE
Ketones, ur: NEGATIVE
Nitrite: NEGATIVE
Specific Gravity, Urine: >=1.03 — AB (ref 1.000–1.030)
Total Protein, Urine: >=300 — AB
Urine Glucose: NEGATIVE
Urobilinogen, UA: 0.2 (ref 0.0–1.0)
pH: 5.5 (ref 5.0–8.0)

## 2013-07-05 LAB — MICROALBUMIN / CREATININE URINE RATIO
Creatinine,U: 195.4 mg/dL
Microalb Creat Ratio: 106.9 mg/g — ABNORMAL HIGH (ref 0.0–30.0)
Microalb, Ur: 208.8 mg/dL — ABNORMAL HIGH (ref 0.0–1.9)

## 2013-07-05 LAB — HEMOGLOBIN A1C: Hgb A1c MFr Bld: 8.3 % — ABNORMAL HIGH (ref 4.6–6.5)

## 2013-07-05 NOTE — Progress Notes (Signed)
[facility]  NAME:  [name], [name] NO.:  [id number]  MEDICAL RECORD NO.:  [id number]      DATE OF BIRTH:  August 15, 1965  PHYSICIAN:  Theodoro Kos, DO       VISIT DATE:  07/04/2013                                  OFFICE VISIT   The patient is a 48 year old female with a left trans met amputation on the lateral aspect.  She has an open area after surgery.  She has been putting Santyl on it with some improvement.  It does not look infected but it is fairly deep, so it is going to take some time to heal.  She is doing 2 Glucerna shakes.  There is no change in medications or social history.  She is going to see a diabetic specialist because she is concerned about the weight gain since changing her diabetic medicine. We will continue with the Santyl and have her follow up in 2 weeks.     Theodoro Kos, DO     CS/MEDQ  D:  07/04/2013  T:  07/05/2013  Job:  [id number]

## 2013-07-07 ENCOUNTER — Telehealth: Payer: Self-pay | Admitting: *Deleted

## 2013-07-07 NOTE — Telephone Encounter (Signed)
Patient also states you mentioned her starting invokana depending on her renal function, she wants to know the results and if she can start taking it.

## 2013-07-07 NOTE — Telephone Encounter (Signed)
Result note has all this  information: Kidney test is abnormally high and needs to know if she is taking any OTC Advil/aspirin/Aleve in large quantities

## 2013-07-08 NOTE — Telephone Encounter (Signed)
Patient states she has been taking large amounts of Aleve/ibuprofen since she's had her surgery and has been out of her percocet.

## 2013-07-08 NOTE — Telephone Encounter (Signed)
She is aware 

## 2013-07-08 NOTE — Telephone Encounter (Signed)
Needs to discuss pain medications with her prescribing doctor

## 2013-07-11 ENCOUNTER — Telehealth: Payer: Self-pay | Admitting: *Deleted

## 2013-07-11 NOTE — Telephone Encounter (Signed)
Patient says she has been on Novolog mix 70/30, she said you switched her to the regular Novolog, she wants to be back on the 70/30. Please advise.

## 2013-07-12 NOTE — Telephone Encounter (Signed)
Discussed with patient: Recently sugars are ranging from 98-138. Advised her to continue the NovoLog dosage as such and we will start her on Victoza on the next visit

## 2013-07-20 ENCOUNTER — Ambulatory Visit: Payer: Medicaid Other | Admitting: Endocrinology

## 2013-07-26 ENCOUNTER — Other Ambulatory Visit: Payer: Self-pay | Admitting: *Deleted

## 2013-07-28 MED ORDER — ALPRAZOLAM 0.5 MG PO TABS: 0.5000 mg | ORAL_TABLET | Freq: Every evening | ORAL | Status: DC | PRN

## 2013-08-04 ENCOUNTER — Telehealth: Payer: Self-pay | Admitting: Family Medicine

## 2013-08-04 ENCOUNTER — Other Ambulatory Visit: Payer: Self-pay | Admitting: *Deleted

## 2013-08-04 MED ORDER — ALPRAZOLAM 0.5 MG PO TABS: 0.5000 mg | ORAL_TABLET | Freq: Every evening | ORAL | Status: DC | PRN

## 2013-08-04 NOTE — Telephone Encounter (Signed)
Would only like Dr. Thomes Dinning

## 2013-08-04 NOTE — Telephone Encounter (Signed)
Pt called and wants Dr. Thomes Dinning to call her concerning a letter that she needs written for her disability. Please call her at [phone]. [email or web address]

## 2013-08-04 NOTE — Telephone Encounter (Signed)
Called pt back and clarify situation for her disability request.

## 2013-08-08 ENCOUNTER — Encounter (HOSPITAL_BASED_OUTPATIENT_CLINIC_OR_DEPARTMENT_OTHER): Payer: Medicaid Other | Attending: Plastic Surgery

## 2013-08-08 DIAGNOSIS — S98919A Complete traumatic amputation of unspecified foot, level unspecified, initial encounter: Secondary | ICD-10-CM | POA: Insufficient documentation

## 2013-08-08 DIAGNOSIS — L97509 Non-pressure chronic ulcer of other part of unspecified foot with unspecified severity: Secondary | ICD-10-CM | POA: Insufficient documentation

## 2013-08-08 DIAGNOSIS — F17200 Nicotine dependence, unspecified, uncomplicated: Secondary | ICD-10-CM | POA: Insufficient documentation

## 2013-08-08 DIAGNOSIS — E1169 Type 2 diabetes mellitus with other specified complication: Secondary | ICD-10-CM | POA: Insufficient documentation

## 2013-08-09 NOTE — Progress Notes (Signed)
[facility]  NAME:  GELSEY, OVERMIER NO.:  [id number]  MEDICAL RECORD NO.:  [id number]      DATE OF BIRTH:  09-Aug-1965  PHYSICIAN:  Theodoro Kos, DO       VISIT DATE:  08/08/2013                                  OFFICE VISIT   HISTORY:  The patient is a 48 year old female who is here for followup on her left transmetatarsal amputation site.  She is using Santyl on the area.  She is still smoking.  No change in her medications or her social history.  She is spending most of her time at home.  She is alert, oriented, cooperative, not in any distress.  She seems to be doing dressing changes appropriately.  PHYSICAL EXAMINATION:  GENERAL:  She is alert, oriented, cooperative, not in any acute distress.  She is pleasant. HEENT:  Pupils equal.  Extraocular muscles intact. NECK:  No cervical lymphadenopathy. RESPIRATORY:  Her breathing is unlabored. CARDIOVASCULAR:  Her heart rate is regular.  Debridement was done and those notes are noted in the chart.  We will continue with the Santyl, but add collagen, and have her follow up in 2 weeks, and also encouraged her to stop smoking.     Theodoro Kos, DO     CS/MEDQ  D:  08/08/2013  T:  08/09/2013  Job:  [id number]

## 2013-08-23 NOTE — Progress Notes (Signed)
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  Overall, the area is looking much better and showing signs of improvement.  There is no sign of infection.  No change in medications.  REVIEW OF SYSTEMS:  Otherwise, negative.  SOCIAL HISTORY:  She is still smoking.  PHYSICAL EXAMINATION:  She is alert, oriented, cooperative, not in any acute distress.  She is pleasant.  Breathing is unlabored.  Heart rate is regular.  ASSESSMENT:  The wound was debrided and those notes are noted in the chart.  Continue with the Santyl, collagen, and follow up in 2 weeks. Also, decrease and stop smoking, increase protein and multivitamin.     Theodoro Kos, DO     CS/MEDQ  D:  08/22/2013  T:  08/23/2013  Job:  [id number]

## 2013-08-25 ENCOUNTER — Encounter: Payer: Self-pay | Admitting: *Deleted

## 2013-08-25 ENCOUNTER — Ambulatory Visit: Payer: Medicaid Other | Admitting: Endocrinology

## 2013-09-05 ENCOUNTER — Encounter (HOSPITAL_BASED_OUTPATIENT_CLINIC_OR_DEPARTMENT_OTHER): Payer: Medicaid Other

## 2013-09-09 ENCOUNTER — Telehealth: Payer: Self-pay | Admitting: *Deleted

## 2013-09-09 ENCOUNTER — Telehealth: Payer: Self-pay | Admitting: Family Medicine

## 2013-09-09 MED ORDER — INSULIN GLARGINE 100 UNIT/ML ~~LOC~~ SOLN: 40.0000 [IU] | Freq: Every day | SUBCUTANEOUS | Status: DC

## 2013-09-09 NOTE — Telephone Encounter (Signed)
Did a chart review and it looks like the Lantus Casimiro Needle was what she was on from 2013 to early 2014, but she has been on Lantus since mid-2014.  I sent in a refill of the Lantus 40U, but she may bump it up to 44U if her blood sugars are consistently above 130.  Thanks for helping me get this sorted out!

## 2013-09-09 NOTE — Telephone Encounter (Signed)
Received refill request for lantus Solostar; inject 24 units subcutaneously daily.  Current medication is for Lantus Inject 0.4 mLs (40 Units total) into the skin daily.  Please clarify? Send refill to [facility]. Derl Barrow, RN

## 2013-09-09 NOTE — Telephone Encounter (Signed)
Was contacted concerning refill request concerning 24U of Lantus Solostar when medication list shows she should be on 40U of Lantus.  Chart review shows patient was on Lantus Solostar from 2013 until early 2014, but has been on Lantus since mid-2014.  Last note indicates that her current dose of Lantus is 40U and that she may increase the dose to 44U if her blood sugar is above 130 in the mornings.  I refilled a prescription for Lantus 40U today.

## 2013-09-19 ENCOUNTER — Encounter (HOSPITAL_BASED_OUTPATIENT_CLINIC_OR_DEPARTMENT_OTHER): Payer: Medicaid Other | Attending: Plastic Surgery

## 2013-09-19 DIAGNOSIS — E1169 Type 2 diabetes mellitus with other specified complication: Secondary | ICD-10-CM | POA: Insufficient documentation

## 2013-09-19 DIAGNOSIS — L97509 Non-pressure chronic ulcer of other part of unspecified foot with unspecified severity: Secondary | ICD-10-CM | POA: Insufficient documentation

## 2013-09-20 ENCOUNTER — Other Ambulatory Visit: Payer: Self-pay | Admitting: Family Medicine

## 2013-09-20 MED ORDER — INSULIN GLARGINE 100 UNIT/ML SOLOSTAR PEN: PEN_INJECTOR | SUBCUTANEOUS | Status: DC

## 2013-09-21 ENCOUNTER — Other Ambulatory Visit: Payer: Self-pay | Admitting: Family Medicine

## 2013-09-21 ENCOUNTER — Telehealth: Payer: Self-pay | Admitting: Family Medicine

## 2013-09-21 NOTE — Telephone Encounter (Signed)
Pt called because she had the wrong medication called in. She needs the Novolog Flexpen called in NOT Latus. Please call in to the [facility] on [address]. [email or web address]

## 2013-09-21 NOTE — Progress Notes (Signed)
Patient called for Lantus solostar.  Called in by Dr Nori Riis yesterday.  Medication to go to [facility] [address].

## 2013-09-29 ENCOUNTER — Telehealth: Payer: Self-pay

## 2013-09-29 NOTE — Telephone Encounter (Signed)
Diabetic Bundle. Lvom for pt to call back and schedule appointment with Dr. Dwyane Dee.

## 2013-10-03 ENCOUNTER — Encounter (HOSPITAL_BASED_OUTPATIENT_CLINIC_OR_DEPARTMENT_OTHER): Payer: Medicaid Other | Attending: Plastic Surgery

## 2013-10-03 DIAGNOSIS — Y835 Amputation of limb(s) as the cause of abnormal reaction of the patient, or of later complication, without mention of misadventure at the time of the procedure: Secondary | ICD-10-CM | POA: Diagnosis not present

## 2013-10-03 DIAGNOSIS — S98919A Complete traumatic amputation of unspecified foot, level unspecified, initial encounter: Secondary | ICD-10-CM | POA: Diagnosis not present

## 2013-10-03 DIAGNOSIS — T879 Unspecified complications of amputation stump: Secondary | ICD-10-CM | POA: Insufficient documentation

## 2013-10-05 NOTE — Progress Notes (Signed)
[facility]  NAME:  [name], [name]                   ACCOUNT NO.:  MEDICAL RECORD NO.:  [id number]      DATE OF BIRTH:  Apr 20, 1965  PHYSICIAN:  Theodoro Kos, DO       VISIT DATE:  10/03/2013                                  OFFICE VISIT   SUBJECTIVE:  The patient is a 48 year old female who is here for followup on her left foot amputation site wound.  She is a diabetic. She has been using different dressings on there and has actually completely healed the area.  There is a little bit of a scabbed area from the boot, but no sign of infection.  MEDICATIONS:  Unchanged.  REVIEW OF SYSTEMS:  Negative.  OBJECTIVE:  GENERAL:  On exam, she is alert, oriented, cooperative, not in any distress. LUNGS:  Breathing is unlabored. HEART:  Her heart rate is regular. WOUND:  The wound is completely healed.  No sign of infection.  ASSESSMENT AND PLAN:  She is to continue with compression, elevation, and follow up as needed.     Theodoro Kos, DO     CS/MEDQ  D:  10/03/2013  T:  10/03/2013  Job:  [id number]

## 2013-10-25 ENCOUNTER — Other Ambulatory Visit: Payer: Self-pay | Admitting: *Deleted

## 2013-10-26 MED ORDER — ALPRAZOLAM 0.5 MG PO TABS: 0.5000 mg | ORAL_TABLET | Freq: Every evening | ORAL | Status: DC | PRN

## 2013-10-26 NOTE — Telephone Encounter (Signed)
I have refilled her Xanax and left the prescription at the front desk for her to pick up.  Please have her schedule an appointment to follow-up on her depression; her last appointment at which anxiety/depression was discussed was in April and medications were changed at that time.

## 2013-10-26 NOTE — Telephone Encounter (Signed)
Relayed message,patient voiced understanding. Ivonne Freeburg S  

## 2013-10-27 ENCOUNTER — Telehealth: Payer: Self-pay | Admitting: *Deleted

## 2013-10-27 NOTE — Telephone Encounter (Signed)
Diabetic bundle, unable to leave message for patient to schedule appointment

## 2013-10-31 ENCOUNTER — Other Ambulatory Visit: Payer: Self-pay | Admitting: *Deleted

## 2013-10-31 MED ORDER — ALPRAZOLAM 0.5 MG PO TABS: 0.5000 mg | ORAL_TABLET | Freq: Every evening | ORAL | Status: DC | PRN

## 2013-10-31 NOTE — Telephone Encounter (Signed)
I've refilled her Xanax and it is waiting for her at the front desk.  Please have her schedule an appointment.  She was asked to follow-up in one month when Xanax was initially prescribed in April, but she has not returned to the office since.  Xanax will not be refilled again unless she follows up in clinic.

## 2013-10-31 NOTE — Telephone Encounter (Signed)
There was already a prescription for Xanax waiting at the front desk for her, so prescription printed out today was shredded.  She still needs to schedule a follow up appointment.

## 2013-11-01 NOTE — Telephone Encounter (Signed)
Left message on patient's voicemail.Nuno Brubacher, Lewie Loron

## 2013-11-01 NOTE — Telephone Encounter (Signed)
Left message on patient's voicemail.[name], [name]

## 2013-11-03 ENCOUNTER — Encounter: Payer: Self-pay | Admitting: Family Medicine

## 2013-11-03 ENCOUNTER — Ambulatory Visit (INDEPENDENT_AMBULATORY_CARE_PROVIDER_SITE_OTHER): Payer: Medicaid Other | Admitting: Family Medicine

## 2013-11-03 ENCOUNTER — Other Ambulatory Visit: Payer: Self-pay | Admitting: Endocrinology

## 2013-11-03 VITALS — BP 167/71 | HR 86 | Temp 98.3°F | Ht 70.0 in | Wt 203.1 lb

## 2013-11-03 DIAGNOSIS — H9209 Otalgia, unspecified ear: Secondary | ICD-10-CM

## 2013-11-03 DIAGNOSIS — H9202 Otalgia, left ear: Secondary | ICD-10-CM | POA: Insufficient documentation

## 2013-11-03 MED ORDER — TRAMADOL HCL 50 MG PO TABS: 50.0000 mg | ORAL_TABLET | Freq: Three times a day (TID) | ORAL | Status: DC | PRN

## 2013-11-03 MED ORDER — CYCLOBENZAPRINE HCL 10 MG PO TABS: 10.0000 mg | ORAL_TABLET | Freq: Three times a day (TID) | ORAL | Status: DC | PRN
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
## 2013-11-10 ENCOUNTER — Ambulatory Visit: Payer: Medicaid Other

## 2013-11-23 ENCOUNTER — Other Ambulatory Visit: Payer: Self-pay | Admitting: Family Medicine

## 2013-11-24 NOTE — Telephone Encounter (Signed)
Spoke with patient and informed her of below 

## 2013-11-24 NOTE — Telephone Encounter (Signed)
I've refilled the prescription of Flexeril and sent it to her pharmacy.  Please let her know to follow up at clinic soon so we can re-evaluate her pain, preferably before she needs another refill!  Thanks!

## 2013-11-28 ENCOUNTER — Other Ambulatory Visit: Payer: Medicaid Other

## 2013-11-29 ENCOUNTER — Other Ambulatory Visit: Payer: Medicaid Other

## 2013-11-29 ENCOUNTER — Other Ambulatory Visit: Payer: Self-pay | Admitting: Endocrinology

## 2013-11-29 ENCOUNTER — Other Ambulatory Visit: Payer: Self-pay | Admitting: *Deleted

## 2013-11-29 DIAGNOSIS — E1065 Type 1 diabetes mellitus with hyperglycemia: Secondary | ICD-10-CM

## 2013-11-29 DIAGNOSIS — IMO0002 Reserved for concepts with insufficient information to code with codable children: Secondary | ICD-10-CM

## 2013-11-29 LAB — COMPLETE METABOLIC PANEL WITH GFR
ALT: 15 U/L (ref 0–35)
AST: 12 U/L (ref 0–37)
Albumin: 3.9 g/dL (ref 3.5–5.2)
Alkaline Phosphatase: 91 U/L (ref 39–117)
BUN: 21 mg/dL (ref 6–23)
CO2: 22 mEq/L (ref 19–32)
Calcium: 10 mg/dL (ref 8.4–10.5)
Chloride: 105 mEq/L (ref 96–112)
Creat: 1.37 mg/dL — ABNORMAL HIGH (ref 0.50–1.10)
GFR, Est African American: 53 mL/min — ABNORMAL LOW
GFR, Est Non African American: 46 mL/min — ABNORMAL LOW
Glucose, Bld: 108 mg/dL — ABNORMAL HIGH (ref 70–99)
Potassium: 4.2 mEq/L (ref 3.5–5.3)
Sodium: 137 mEq/L (ref 135–145)
Total Bilirubin: 0.4 mg/dL (ref 0.2–1.2)
Total Protein: 7.5 g/dL (ref 6.0–8.3)

## 2013-11-30 LAB — HEMOGLOBIN A1C
Hgb A1c MFr Bld: 10.1 % — ABNORMAL HIGH (ref <5.7)
Mean Plasma Glucose: 243 mg/dL — ABNORMAL HIGH (ref <117)

## 2013-12-01 ENCOUNTER — Ambulatory Visit (INDEPENDENT_AMBULATORY_CARE_PROVIDER_SITE_OTHER): Payer: Medicaid Other | Admitting: Endocrinology

## 2013-12-01 ENCOUNTER — Encounter: Payer: Self-pay | Admitting: Endocrinology

## 2013-12-01 ENCOUNTER — Other Ambulatory Visit: Payer: Self-pay | Admitting: *Deleted

## 2013-12-01 VITALS — BP 150/74 | HR 88 | Temp 97.9°F | Resp 16 | Ht 70.0 in | Wt 210.0 lb

## 2013-12-01 DIAGNOSIS — E1140 Type 2 diabetes mellitus with diabetic neuropathy, unspecified: Secondary | ICD-10-CM

## 2013-12-01 DIAGNOSIS — E118 Type 2 diabetes mellitus with unspecified complications: Secondary | ICD-10-CM

## 2013-12-01 DIAGNOSIS — IMO0002 Reserved for concepts with insufficient information to code with codable children: Secondary | ICD-10-CM | POA: Insufficient documentation

## 2013-12-01 DIAGNOSIS — E1165 Type 2 diabetes mellitus with hyperglycemia: Secondary | ICD-10-CM

## 2013-12-01 DIAGNOSIS — I10 Essential (primary) hypertension: Secondary | ICD-10-CM

## 2013-12-01 DIAGNOSIS — E1129 Type 2 diabetes mellitus with other diabetic kidney complication: Secondary | ICD-10-CM | POA: Insufficient documentation

## 2013-12-01 MED ORDER — INSULIN ASPART 100 UNIT/ML FLEXPEN: PEN_INJECTOR | SUBCUTANEOUS | Status: DC

## 2013-12-01 MED ORDER — CANAGLIFLOZIN 100 MG PO TABS: ORAL_TABLET | ORAL | Status: DC

## 2013-12-01 NOTE — Progress Notes (Signed)
Patient ID: [name], female   DOB: 27-Nov-1965, 48 y.o.   MRN: [id number]    Reason for Appointment: Followup for Type 2 Diabetes  History of Present Illness:          Diagnosis: Type 2 diabetes mellitus, date of diagnosis: 1992        Past history: She had symptomatic hyperglycemia at onset and was treated with metformin and Amaryl. However because of GI side effects from metformin this was not continued  And she thinks she was continued on Amaryl for several years. Subsequently in 2002 when she was pregnant she was treated with insulin  After her pregnancy insulin was stopped and she was back on oral agents Insulin was started again about 3 years ago because of poor control. She does not think she tried any other medications She has been on various insulin regimens in the last 3 years including NovoLog, Novolin 70/30, NovoLog mix 70/30 and Lantus  In 9/14 because of tendency to weight gain she was given a sample of Byetta 5 mcg to try but she could not tolerate this for over 2 weeks because of significant nausea. Has not tried Victoza previously She has had persistently poor control in the past with A1c at least 10% since about 2012  Recent history:  She had been taking NovoLog mix 70/30 since about 06/2012 along with Lantus However with this regimen she had a tendency to low blood sugars after dinner and progressive weight gain She was told to switch her NovoLog mix to NovoLog before meals and also start Invokana on her visit in 5/50 However she did not start her Invokana and did not come back for followup On her own she has increased her NovoLog progressively to at least 20 units before her meals especially at breakfast and supper However her blood sugars are inconsistent and not clear if she is following her treatment regimen consistently; cannot explain why some days her sugars are over 400 at all different times Currently not on insulin sensitizer and has previously had a history  of GI intolerance to metformin and also Byetta Her overall control appears to be significantly worse with a rising A1c  Current blood sugar patterns:  Her highest blood sugars before breakfast  She has variable blood sugars the rest of the day although overall averaging over 200 at all different times  Usually not checking blood sugars after her evening meal but these appear to be slightly better  She does have significant variability overall with standard deviation 87 Hypoglycemia: None recently      Oral hypoglycemic drugs the patient is taking are: None      Side effects from medications have been: Metformin caused diarrhea INSULIN regimen is described as: NovoLog 22-30,    Lantus 40-44 hs  Glucose monitoring:  2.1 times a day       Glucometer:  Accu-Chek Aviva plus   Blood Glucose readings from monitor download:  PREMEAL Breakfast Lunch Dinner Bedtime Overall  Glucose range:  231-454   130-401   123-244   80-172    Mean/median:  317   267   239    257+/-87    Glycemic control:   Lab Results  Component Value Date   HGBA1C 10.1* 11/29/2013   HGBA1C 8.3* 07/04/2013   HGBA1C 7.6 06/23/2013   Lab Results  Component Value Date   MICROALBUR 208.8* 07/04/2013   LDLCALC 58 07/04/2013   CREATININE 1.37* 11/29/2013    Self-care: The diet  that the patient has been following is: tries to limit carbohydrates especially at lunchtime. At breakfast she will have eggs and toast, usually will have a meat protein, cheese or egg at lunchtime usually no carbohydrate, dinner has a mixed meal with some carbohydrate. Usually avoiding high fat meals and fried food. Eating outside infrequently       Meals: 3 meals per day. Dinner 5-6 pm         Exercise: none because of history of foot ulcer and neuropathy         Dietician visit: Most recent: At diagnosis.               Compliance with the medical regimen: Fair Complications: Peripheral neuropathy with sensory loss and history of diabetic foot  ulcer Retinal exam: Most recent:.2014     Weight history: Previous range 170-218  Wt Readings from Last 3 Encounters:  12/01/13 210 lb (95.255 kg)  11/03/13 203 lb 1.6 oz (92.126 kg)  07/04/13 221 lb 6.4 oz (100.426 kg)      Medication List       This list is accurate as of: 12/01/13 11:04 AM.  Always use your most recent med list.               ACCU-CHEK AVIVA PLUS W/DEVICE Kit  CHECK BLOOD SUGARS THREE TIMES A DAY.  DX:250.03     ALPRAZolam 0.5 MG tablet  Commonly known as:  XANAX  Take 1 tablet (0.5 mg total) by mouth at bedtime as needed for anxiety.     cyclobenzaprine 10 MG tablet  Commonly known as:  FLEXERIL  TAKE ONE TABLET BY MOUTH THREE TIMES DAILY AS NEEDED FOR MUSCLE SPASM     DULoxetine 30 MG capsule  Commonly known as:  CYMBALTA  Take 1 capsule (30 mg total) by mouth daily.     gabapentin 300 MG capsule  Commonly known as:  NEURONTIN  Take 1 capsule (300 mg total) by mouth 3 (three) times daily.     glucose blood test strip  Commonly known as:  ACCU-CHEK AVIVA PLUS  USE TO CHECK BLOOD SUGAR BEFORE MEALS AND AT BEDTIME     Insulin Glargine 100 UNIT/ML Solostar Pen  Commonly known as:  LANTUS  Inject 40 to 45 units subQ as directed daily     lisinopril 20 MG tablet  Commonly known as:  PRINIVIL,ZESTRIL  Take 1 tablet (20 mg total) by mouth daily.     NOVOLOG FLEXPEN 100 UNIT/ML FlexPen  Generic drug:  insulin aspart  INJECT 22-28 UNITS BEFORE MEALS TWO TIMES DAILY     omeprazole 20 MG capsule  Commonly known as:  PRILOSEC  Take 20 mg by mouth daily.     traMADol 50 MG tablet  Commonly known as:  ULTRAM  Take 1 tablet (50 mg total) by mouth every 8 (eight) hours as needed.        Allergies:  Allergies  Allergen Reactions  . Latex Rash    Past Medical History  Diagnosis Date  . Diabetes type 1, uncontrolled   . Chest tightness   . Urinary frequency   . Nausea and vomiting in adult   . Diabetic neuropathy   . Essential  hypertension   . Yeast vaginitis   . GERD (gastroesophageal reflux disease)     Past Surgical History  Procedure Laterality Date  . Endometrial ablation    . Amputation Left 06/15/2012    Procedure: AMPUTATION DIGIT;  Surgeon: Gregory Scott Dean,   MD;  Location: [place];  Service: Orthopedics;  Laterality: Left;  Left great toe revision amputation  . Achilles tendon surgery Left 03/10/2013    Procedure: LEFT CHOPART AMPUTATION/ LEFT TENDON ACHILLES [place];  Surgeon: Wylene Simmer, MD;  Location: [place];  Service: Orthopedics;  Laterality: Left;    Family History  Problem Relation Age of Onset  . Diabetes Mother   . Heart attack Mother   . Heart disease Mother     before age 48  . Hypertension Mother   . Hyperlipidemia Mother   . Diabetes Father   . Heart attack Father   . Heart disease Father     before age 55    Social History:  reports that she has been smoking Cigarettes.  She has a 11 pack-year smoking history. She has never used smokeless tobacco. She reports that she drinks alcohol. She reports that she does not use illicit drugs.    Review of Systems       Lipids: She has never been on medications      Lab Results  Component Value Date   CHOL 195 07/04/2013   HDL 29.50* 07/04/2013   LDLCALC 58 07/04/2013   LDLDIRECT 106* 11/20/2011   TRIG 538.0 Triglyceride is over 400; calculations on Lipids are invalid.* 07/04/2013   CHOLHDL 7 07/04/2013    Hypertension: Currently on lisinopril 20 mg  Renal dysfunction: Her creatinine is somewhat better with stopping OTC ibuprofen and Aleve, previously 1.6   LABS:  Orders Only on 11/29/2013  Component Date Value Ref Range Status  . Sodium 11/29/2013 137  135 - 145 mEq/L Final  . Potassium 11/29/2013 4.2  3.5 - 5.3 mEq/L Final  . Chloride 11/29/2013 105  96 - 112 mEq/L Final  . CO2 11/29/2013 22  19 - 32 mEq/L Final  . Glucose, Bld 11/29/2013 108* 70 - 99 mg/dL Final  . BUN 11/29/2013 21  6 - 23 mg/dL Final  . Creat 11/29/2013 1.37*  0.50 - 1.10 mg/dL Final  . Total Bilirubin 11/29/2013 0.4  0.2 - 1.2 mg/dL Final  . Alkaline Phosphatase 11/29/2013 91  39 - 117 U/L Final  . AST 11/29/2013 12  0 - 37 U/L Final  . ALT 11/29/2013 15  0 - 35 U/L Final  . Total Protein 11/29/2013 7.5  6.0 - 8.3 g/dL Final  . Albumin 11/29/2013 3.9  3.5 - 5.2 g/dL Final  . Calcium 11/29/2013 10.0  8.4 - 10.5 mg/dL Final  . GFR, Est African American 11/29/2013 53*  Final  . GFR, Est Non African American 11/29/2013 46*  Final   Comment:                            The estimated GFR is a calculation valid for adults (>=30 years old)                          that uses the CKD-EPI algorithm to adjust for age and sex. It is                            not to be used for children, pregnant women, hospitalized patients,                             patients on dialysis, or with rapidly changing kidney function.  According to the NKDEP, eGFR >89 is normal, 60-89 shows mild                          impairment, 30-59 shows moderate impairment, 15-29 shows severe                          impairment and <15 is ESRD.                             . Hemoglobin A1C 11/29/2013 10.1* <5.7 % Final   Comment:                                                                                                 According to the ADA Clinical Practice Recommendations for 2011, when                          HbA1c is used as a screening test:                                                       >=6.5%   Diagnostic of Diabetes Mellitus                                     (if abnormal result is confirmed)                                                     5.7-6.4%   Increased risk of developing Diabetes Mellitus                                                     References:Diagnosis and Classification of Diabetes Mellitus,Diabetes                          Care,2011,34(Suppl 1):S62-S69 and Standards of Medical Care in                                   Diabetes - 2011,Diabetes Care,2011,34 (Suppl 1):S11-S61.                             . Mean Plasma Glucose 11/29/2013 243* <117 mg/dL Final    Physical Examination:  BP 150/74  Pulse 88  Temp(Src) 97.9 F (36.6 C)    Resp 16  Ht 5' 10" (1.778 m)  Wt 210 lb (95.255 kg)  BMI 30.13 kg/m2  SpO2 97%     ASSESSMENT:  Diabetes type 2 with mild obesity   As discussed in history of present illness she is having worsening blood sugar control and A1c of around 10% now She has not followed up since her initial consultation and appears to need significant amount of diabetes education to help with management Since her fasting blood sugars are averaging over 300 and fairly consistently high have indicated to the patient that she needs a significant amount of basal insulin for her insulin resistance Although she is taking relatively large amounts of mealtime coverage this is still inadequate and blood sugars are mostly high in the afternoon and evening Currently not on insulin sensitizer and has previously had a history of GI intolerance to metformin and also Byetta She is a good candidate for Invokana and discussed in detail benefits and possible side effects and reassured her that there is no scientific basis for the adverse publicity in the media  PLAN:   Trial of Invokana 100 mg daily before breakfast  Increase Lantus to 55 units and consider using Toujeo. Discussed titrating the dose every 4 days to keep morning sugar under 130 and given her a flow sheet to do this.  Discussed need to adjust mealtime doses based on both carbohydrate intake, pre-meal and post prandial readings especially her suppertime dose; she is not checking enough readings after dinner  May need to reduce follow insulin doses by 4-6 units when blood sugars start  improving with Invokana  Reduce lisinopril in half as blood pressure will improve with Invokana and would reduce chances of renal dysfunction  Diet regular  exercise with exercise bike  She will review her day-to-day management and insulin doses with nurse educator  Followup in 3 weeks  Counseling time over 50% of today's 25 minute visit  KUMAR,AJAY 12/01/2013, 11:04 AM    

## 2013-12-01 NOTE — Patient Instructions (Addendum)
Lantus 55 units and increase every 3 days as needed  Novolog before meals as determined by Sugar and carbs  Exercise bike daily  Invokana in am, reduce Lisinopril by 1/2  Some more sugars at bedtime

## 2013-12-07 ENCOUNTER — Telehealth: Payer: Self-pay | Admitting: Endocrinology

## 2013-12-07 NOTE — Telephone Encounter (Signed)
Patient called stating that her [facility] [address]

## 2013-12-08 NOTE — Telephone Encounter (Signed)
P.A has been sent, waiting on insurance approval

## 2013-12-13 ENCOUNTER — Other Ambulatory Visit: Payer: Self-pay | Admitting: *Deleted

## 2013-12-13 ENCOUNTER — Telehealth: Payer: Self-pay | Admitting: Endocrinology

## 2013-12-13 MED ORDER — GLUCOSE BLOOD VI STRP: ORAL_STRIP | Status: DC

## 2013-12-13 NOTE — Telephone Encounter (Signed)
rx sent

## 2013-12-13 NOTE — Telephone Encounter (Signed)
Patient would like her teststrips called in to her pharmacy She is all out  Pharmacy: [facility]  Thank you

## 2013-12-14 ENCOUNTER — Encounter: Payer: Medicaid Other | Attending: Endocrinology | Admitting: Nutrition

## 2013-12-14 DIAGNOSIS — IMO0002 Reserved for concepts with insufficient information to code with codable children: Secondary | ICD-10-CM

## 2013-12-14 DIAGNOSIS — Z713 Dietary counseling and surveillance: Secondary | ICD-10-CM | POA: Diagnosis not present

## 2013-12-14 DIAGNOSIS — E1165 Type 2 diabetes mellitus with hyperglycemia: Secondary | ICD-10-CM

## 2013-12-14 DIAGNOSIS — E1140 Type 2 diabetes mellitus with diabetic neuropathy, unspecified: Secondary | ICD-10-CM | POA: Diagnosis present

## 2013-12-19 ENCOUNTER — Other Ambulatory Visit: Payer: Self-pay | Admitting: Family Medicine

## 2013-12-21 NOTE — Patient Instructions (Signed)
Increase dose of Lantus by 4 units every 3-4 days until FBS is less than 130. Limit the amount of carb servings to 2-3 at each meal Increase dose of Novolog by 2u if eating more carb servings, and decrease the dose if eating fewer carbs than 3 servings.

## 2013-12-21 NOTE — Progress Notes (Signed)
The patient is here today to review her blood sugars.  She did not bring her machine.  Says FBSs are around 150-180.  She is taking Lantus 55u.  She has not increased the dose.  We discussed how this Lantus works and why we need to increase this dose, and reviewed how to do this. Written instructions were given to increase the dose by 4 units every 3-4 days until the FBS is less than 130.   She reported good understanding of this.   We also discussed the need to increase/decrease dose of Novolog depending on the amount of carbs eaten at each meal.  We reviewed what carbs are, and she was given a list of carb servings, and told to limit her meals to no more than 2-3 servings.   We also discussed the need to test blood sugars before meals--to determine if the previous dose of Novolog worked to cover the meal.  She reported good understanding of this and had no final questions.

## 2013-12-22 ENCOUNTER — Ambulatory Visit: Payer: Medicaid Other | Admitting: Endocrinology

## 2013-12-22 ENCOUNTER — Encounter: Payer: Self-pay | Admitting: *Deleted

## 2013-12-22 ENCOUNTER — Telehealth: Payer: Self-pay | Admitting: Endocrinology

## 2013-12-22 NOTE — Telephone Encounter (Signed)
Patient no showed today's appt. Please advise on how to follow up. °A. No follow up necessary. °B. Follow up urgent. Contact patient immediately. °C. Follow up necessary. Contact patient and schedule visit in ___ days. °D. Follow up advised. Contact patient and schedule visit in ____weeks. ° °

## 2013-12-23 NOTE — Telephone Encounter (Signed)
Follow up semi- urgent. Contact patient immediately

## 2013-12-23 NOTE — Telephone Encounter (Signed)
appt scheduled

## 2013-12-26 ENCOUNTER — Ambulatory Visit (INDEPENDENT_AMBULATORY_CARE_PROVIDER_SITE_OTHER): Payer: Medicaid Other | Admitting: Endocrinology

## 2013-12-26 ENCOUNTER — Other Ambulatory Visit: Payer: Self-pay | Admitting: *Deleted

## 2013-12-26 ENCOUNTER — Encounter: Payer: Self-pay | Admitting: Endocrinology

## 2013-12-26 ENCOUNTER — Telehealth: Payer: Self-pay | Admitting: *Deleted

## 2013-12-26 VITALS — BP 144/78 | HR 84 | Temp 97.6°F | Resp 16 | Ht 70.0 in | Wt 213.6 lb

## 2013-12-26 DIAGNOSIS — E1140 Type 2 diabetes mellitus with diabetic neuropathy, unspecified: Secondary | ICD-10-CM

## 2013-12-26 DIAGNOSIS — IMO0002 Reserved for concepts with insufficient information to code with codable children: Secondary | ICD-10-CM

## 2013-12-26 DIAGNOSIS — E1165 Type 2 diabetes mellitus with hyperglycemia: Secondary | ICD-10-CM

## 2013-12-26 DIAGNOSIS — H9202 Otalgia, left ear: Secondary | ICD-10-CM

## 2013-12-26 DIAGNOSIS — N289 Disorder of kidney and ureter, unspecified: Secondary | ICD-10-CM

## 2013-12-26 DIAGNOSIS — I10 Essential (primary) hypertension: Secondary | ICD-10-CM

## 2013-12-26 MED ORDER — PREGABALIN 100 MG PO CAPS
100.0000 mg | ORAL_CAPSULE | Freq: Two times a day (BID) | ORAL | Status: DC
Start: 2013-12-26 — End: 2014-05-08

## 2013-12-26 NOTE — Progress Notes (Signed)
Patient ID: [name], female   DOB: 01-16-1966, 48 y.o.   MRN: [id number]    Reason for Appointment: Followup for Type 2 Diabetes  History of Present Illness:          Diagnosis: Type 2 diabetes mellitus, date of diagnosis: 1992        Past history: She had symptomatic hyperglycemia at onset and was treated with metformin and Amaryl. However because of GI side effects from metformin this was not continued  And she thinks she was continued on Amaryl for several years. Subsequently in 2002 when she was pregnant she was treated with insulin  After her pregnancy insulin was stopped and she was back on oral agents Insulin was started again about 3 years ago because of poor control. She does not think she tried any other medications She has been on various insulin regimens in the last 3 years including NovoLog, Novolin 70/30, NovoLog mix 70/30 and Lantus  In 9/14 because of tendency to weight gain she was given a sample of Byetta 5 mcg to try but she could not tolerate this for over 2 weeks because of significant nausea. Has not tried Victoza previously She has had persistently poor control in the past with A1c at least 10% since about 2012 She had been taking NovoLog mix 70/30 since about 06/2012 along with Lantus  Recent history:  She was told to switch her NovoLog mix to NovoLog before meals and also start Invokana on her visit in 5/15 However she did not start her Invokana because of insurance denial On her own she had increased her NovoLog progressively but her blood sugars were still poorly controlled On her last visit her Lantus was increased because of markedly higher fasting readings to 55 units from her previous regimen of 40-44 units She was also seen by nurse educator for general education recently Her blood sugars have improved dramatically although she did not bring her monitor for download She claims that her blood sugars are relatively low and has cut back on her Lantus to 50  units She has also reduced her mealtime insulin by about 10 units She is not trying to take her NOVOLOG based on carbohydrate intake after discussion with dietitian Although she thinks she is taking about a 1:5 carbohydrate coverage she usually will take more especially with higher fat meals Currently using Prodigy monitor She has gradually gained weight Her Invokana has just been approved  Hypoglycemia: None recently      Oral hypoglycemic drugs the patient is taking are: None      Side effects from medications have been: Metformin caused diarrhea INSULIN regimen is described as: NovoLog 12-14,    Lantus 50 hs  Glucose monitoring:  2.1 times a day       Glucometer:  Accu-Chek Aviva plus or Prodigy   Blood Glucose readings from monitor  f 126, 93 pc lunch 89-145 Hs 133:  Glycemic control:   Lab Results  Component Value Date   HGBA1C 10.1* 11/29/2013   HGBA1C 8.3* 07/04/2013   HGBA1C 7.6 06/23/2013   Lab Results  Component Value Date   MICROALBUR 208.8* 07/04/2013   LDLCALC 58 07/04/2013   CREATININE 1.37* 11/29/2013    Self-care: The diet that the patient has been following is: tries to limit carbohydrates especially at lunchtime. At breakfast she will have eggs and toast, usually will have a meat protein, cheese or egg at lunchtime usually no carbohydrate, dinner has a mixed meal with some carbohydrate.  Usually avoiding high fat meals and fried food. Eating outside infrequently       Meals: 3 meals per day. Dinner 5-6 pm         Exercise:  starting to use treadmill now for about 30 minutes, uses this slowly Dietician visit: Most recent: At diagnosis.               Compliance with the medical regimen: Fair Complications: Peripheral neuropathy with sensory loss and history of diabetic foot ulcer Retinal exam: Most recent:.2014     Weight history: Previous range 170-218  Wt Readings from Last 3 Encounters:  12/26/13 213 lb 9.6 oz (96.888 kg)  12/01/13 210 lb (95.255 kg)   11/03/13 203 lb 1.6 oz (92.126 kg)      Medication List       This list is accurate as of: 12/26/13  3:56 PM.  Always use your most recent med list.               ACCU-CHEK AVIVA PLUS W/DEVICE Kit  CHECK BLOOD SUGARS THREE TIMES A DAY.  DX:250.03     ALPRAZolam 0.5 MG tablet  Commonly known as:  XANAX  Take 1 tablet (0.5 mg total) by mouth at bedtime as needed for anxiety.     canagliflozin 100 MG Tabs tablet  Commonly known as:  INVOKANA  Take 1 tablet daily     cyclobenzaprine 10 MG tablet  Commonly known as:  FLEXERIL  TAKE ONE TABLET BY MOUTH THREE TIMES DAILY AS NEEDED FOR MUSCLE SPASM     DULoxetine 30 MG capsule  Commonly known as:  CYMBALTA  Take 1 capsule (30 mg total) by mouth daily.     gabapentin 300 MG capsule  Commonly known as:  NEURONTIN  Take 1 capsule (300 mg total) by mouth 3 (three) times daily.     glucose blood test strip  Commonly known as:  ACCU-CHEK AVIVA PLUS  USE TO CHECK BLOOD SUGAR BEFORE MEALS AND AT BEDTIME dx code E10.65     insulin aspart 100 UNIT/ML FlexPen  Commonly known as:  NOVOLOG  12-14 Units.     Insulin Glargine 100 UNIT/ML Solostar Pen  Commonly known as:  LANTUS  50 Units.     lisinopril 20 MG tablet  Commonly known as:  PRINIVIL,ZESTRIL  Take 1 tablet (20 mg total) by mouth daily.     omeprazole 20 MG capsule  Commonly known as:  PRILOSEC  Take 20 mg by mouth daily.     traMADol 50 MG tablet  Commonly known as:  ULTRAM  Take 1 tablet (50 mg total) by mouth every 8 (eight) hours as needed.        Allergies:  Allergies  Allergen Reactions  . Latex Rash    Past Medical History  Diagnosis Date  . Diabetes type 1, uncontrolled   . Chest tightness   . Urinary frequency   . Nausea and vomiting in adult   . Diabetic neuropathy   . Essential hypertension   . Yeast vaginitis   . GERD (gastroesophageal reflux disease)     Past Surgical History  Procedure Laterality Date  . Endometrial ablation    .  Amputation Left 06/15/2012    Procedure: AMPUTATION DIGIT;  Surgeon: Meredith Pel, MD;  Location: [place];  Service: Orthopedics;  Laterality: Left;  Left great toe revision amputation  . Achilles tendon surgery Left 03/10/2013    Procedure: LEFT CHOPART AMPUTATION/ LEFT TENDON ACHILLES [place];  Surgeon:  Wylene Simmer, MD;  Location: [place];  Service: Orthopedics;  Laterality: Left;    Family History  Problem Relation Age of Onset  . Diabetes Mother   . Heart attack Mother   . Heart disease Mother     before age 39  . Hypertension Mother   . Hyperlipidemia Mother   . Diabetes Father   . Heart attack Father   . Heart disease Father     before age 21    Social History:  reports that she has been smoking Cigarettes.  She has a 11 pack-year smoking history. She has never used smokeless tobacco. She reports that she drinks alcohol. She reports that she does not use illicit drugs.    Review of Systems       Lipids: She has never been on medications      Lab Results  Component Value Date   CHOL 195 07/04/2013   HDL 29.50* 07/04/2013   LDLCALC 58 07/04/2013   LDLDIRECT 106* 11/20/2011   TRIG 538.0 Triglyceride is over 400; calculations on Lipids are invalid.* 07/04/2013   CHOLHDL 7 07/04/2013    Hypertension: Currently on lisinopril 20 mg  Renal dysfunction: Her creatinine was somewhat better with stopping OTC ibuprofen and Aleve, previously 1.6  NEUROPATHY: She is complaining about continued pain with neuropathy in her legs and feet. She does not think increasing the dose of gabapentin is helping and she is only taking 30 mg of Cymbalta Asking for additional medications for relief  LABS:  Pending  Physical Examination:  BP 144/78  Pulse 84  Temp(Src) 97.6 F (36.4 C)  Resp 16  Ht '5\' 10"'  (1.778 m)  Wt 213 lb 9.6 oz (96.888 kg)  BMI 30.65 kg/m2  SpO2 96%     ASSESSMENT:  Diabetes type 2 with mild obesity   As discussed in history of present illness she is having better  blood sugar control with increasing her Lantus and also working with the nurse educator on improving her diet Baseline A1c around 10% Although she is not using her Accu-Chek and not keeping a record she believes that her blood sugars are mostly near normal recently without hypoglycemia now Did reduce her Lantus by 5 units and has tried to keep up for adjusting the dose until recently She also appears to be watching her carbohydrates and this may be helping her reduce her mealtime coverage However not clear what her carbohydrate coverage ratio should be She has just been approved to get her Invokana and would recommend starting this to help her reduce insulin dose and provide some weight gain She is agreeable to start this However will need to check her renal function again  NEUROPATHY: She is still having significant pain  PLAN:   To start taking Invokana 100 mg daily before breakfast  Reduce Lantus to 48 units and may need to reduce it further with using Invokana, discussed adjusting based on fasting readings.  Discussed need to adjust mealtime doses based on both carbohydrate intake, pre-meal blood sugars and most likely will reduce her dose 2-4 units when Invokana becomes effective  Discussed possible side effects of Invokana such as candidiasis  Reduce lisinopril in half as blood pressure will improve with Invokana and would reduce chances of renal dysfunction  Continue working on exercise regimen  Followup in 6 weeks  Change Neurontin to Lyrica 100 mg twice a day and also consider increasing Cymbalta to 60 mg  Counseling time over 50% of today's 25 minute visit  Yuriel Lopezmartinez 12/26/2013, 3:56 PM

## 2013-12-26 NOTE — Patient Instructions (Addendum)
With Invokana 100mg  reduce Lisinopril to 1/2  Lantus 48 units, reduce to 44 if am sugar < 90  Lyrica 2x daily instead of Neurontin

## 2013-12-26 NOTE — Telephone Encounter (Signed)
Refill request for levofloxacin 750mg .  Is this ok to refill?

## 2013-12-27 LAB — BASIC METABOLIC PANEL
BUN: 29 mg/dL — ABNORMAL HIGH (ref 6–23)
CO2: 22 mEq/L (ref 19–32)
Calcium: 9.3 mg/dL (ref 8.4–10.5)
Chloride: 104 mEq/L (ref 96–112)
Creatinine, Ser: 1.8 mg/dL — ABNORMAL HIGH (ref 0.4–1.2)
GFR: 32.74 mL/min — ABNORMAL LOW (ref >60.00)
Glucose, Bld: 110 mg/dL — ABNORMAL HIGH (ref 70–99)
Potassium: 4.1 mEq/L (ref 3.5–5.1)
Sodium: 136 mEq/L (ref 135–145)

## 2013-12-27 MED ORDER — TRAMADOL HCL 50 MG PO TABS
50.0000 mg | ORAL_TABLET | Freq: Three times a day (TID) | ORAL | Status: DC | PRN
Start: 2013-12-27 — End: 2014-02-17

## 2013-12-27 MED ORDER — CYCLOBENZAPRINE HCL 10 MG PO TABS: ORAL_TABLET | ORAL | Status: DC

## 2013-12-27 NOTE — Telephone Encounter (Signed)
Rhea NO--do not refill this antibiotic. I do not know what it is for, and no one from this practice has seen her since July THANKS! Dorcas Mcmurray

## 2013-12-29 ENCOUNTER — Telehealth: Payer: Self-pay | Admitting: *Deleted

## 2013-12-29 ENCOUNTER — Other Ambulatory Visit: Payer: Self-pay | Admitting: *Deleted

## 2013-12-29 NOTE — Telephone Encounter (Signed)
Patient was given her results yesterday about her Kidney test, she wants to know if the aspertame she uses with her diet drinks and Ibuprofen could affect her kidneys as well?  Please advise  CB# [phone]

## 2013-12-29 NOTE — Telephone Encounter (Signed)
Noted, patient is aware. 

## 2013-12-29 NOTE — Telephone Encounter (Signed)
Only Advil if over 200mg  daily

## 2013-12-30 ENCOUNTER — Ambulatory Visit (INDEPENDENT_AMBULATORY_CARE_PROVIDER_SITE_OTHER): Payer: Medicaid Other | Admitting: Family Medicine

## 2013-12-30 ENCOUNTER — Encounter: Payer: Self-pay | Admitting: Family Medicine

## 2013-12-30 ENCOUNTER — Ambulatory Visit
Admission: RE | Admit: 2013-12-30 | Discharge: 2013-12-30 | Disposition: A | Discharge status: Home / Self Care | Payer: Medicaid Other | Source: Ambulatory Visit | Attending: Family Medicine | Admitting: Family Medicine

## 2013-12-30 VITALS — BP 168/85 | HR 90 | Temp 98.8°F | Ht 71.0 in | Wt 217.0 lb

## 2013-12-30 DIAGNOSIS — F419 Anxiety disorder, unspecified: Secondary | ICD-10-CM

## 2013-12-30 DIAGNOSIS — M5412 Radiculopathy, cervical region: Secondary | ICD-10-CM

## 2013-12-30 DIAGNOSIS — M542 Cervicalgia: Secondary | ICD-10-CM

## 2013-12-30 MED ORDER — CYCLOBENZAPRINE HCL 10 MG PO TABS: ORAL_TABLET | ORAL | Status: DC

## 2013-12-30 MED ORDER — ALPRAZOLAM 0.5 MG PO TABS: 0.5000 mg | ORAL_TABLET | Freq: Every evening | ORAL | Status: DC | PRN

## 2013-12-30 NOTE — Patient Instructions (Signed)
Thank you so much for coming to visit [place] today! I would like to get some pictures of your neck. I have placed an order and you may call and schedule an appointment! If these are normal, we may need to get an MRI. I have printed out some gentle exercises you can try to help out with the pain and radicular symptoms. I have also placed an order for physical therapy to help out with your neck pain. I have refilled your Flexeril.  I have also refilled your Xanax, but this is not a medication that you should take in the long term. We will need to consider weaning you off soon! Please follow up with [place] after your Xray so we can discuss the results!  Thanks again! Dr. Gerlean Ren  Cervical Radiculopathy Cervical radiculopathy happens when a nerve in the neck is pinched or bruised by a slipped (herniated) disk or by arthritic changes in the bones of the cervical spine. This can occur due to an injury or as part of the normal aging process. Pressure on the cervical nerves can cause pain or numbness that runs from your neck all the way down into your arm and fingers. CAUSES  There are many possible causes, including:  Injury.  Muscle tightness in the neck from overuse.  Swollen, painful joints (arthritis).  Breakdown or degeneration in the bones and joints of the spine (spondylosis) due to aging.  Bone spurs that may develop near the cervical nerves. SYMPTOMS  Symptoms include pain, weakness, or numbness in the affected arm and hand. Pain can be severe or irritating. Symptoms may be worse when extending or turning the neck. DIAGNOSIS  Your caregiver will ask about your symptoms and do a physical exam. He or she may test your strength and reflexes. X-rays, CT scans, and MRI scans may be needed in cases of injury or if the symptoms do not go away after a period of time. Electromyography (EMG) or nerve conduction testing may be done to study how your nerves and muscles are working. TREATMENT  Your  caregiver may recommend certain exercises to help relieve your symptoms. Cervical radiculopathy can, and often does, get better with time and treatment. If your problems continue, treatment options may include:  Wearing a soft collar for short periods of time.  Physical therapy to strengthen the neck muscles.  Medicines, such as nonsteroidal anti-inflammatory drugs (NSAIDs), oral corticosteroids, or spinal injections.  Surgery. Different types of surgery may be done depending on the cause of your problems. HOME CARE INSTRUCTIONS   Put ice on the affected area.  Put ice in a plastic bag.  Place a towel between your skin and the bag.  Leave the ice on for 15-20 minutes, 03-04 times a day or as directed by your caregiver.  If ice does not help, you can try using heat. Take a warm shower or bath, or use a hot water bottle as directed by your caregiver.  You may try a gentle neck and shoulder massage.  Use a flat pillow when you sleep.  Only take over-the-counter or prescription medicines for pain, discomfort, or fever as directed by your caregiver.  If physical therapy was prescribed, follow your caregiver's directions.  If a soft collar was prescribed, use it as directed. SEEK IMMEDIATE MEDICAL CARE IF:   Your pain gets much worse and cannot be controlled with medicines.  You have weakness or numbness in your hand, arm, face, or leg.  You have a high fever or a  stiff, rigid neck.  You lose bowel or bladder control (incontinence).  You have trouble with walking, balance, or speaking. MAKE SURE YOU:   Understand these instructions.  Will watch your condition.  Will get help right away if you are not doing well or get worse. Document Released: 11/12/2000 Document Revised: 05/12/2011 Document Reviewed: 10/01/2010 [facility] Patient Information 2015 [place], [place]. This information is not intended to replace advice given to you by your health care provider. Make sure you  discuss any questions you have with your health care provider.

## 2014-01-02 ENCOUNTER — Telehealth: Payer: Self-pay | Admitting: Family Medicine

## 2014-01-02 NOTE — Telephone Encounter (Signed)
Pat called and would like to know what her xray results are. jw

## 2014-01-03 DIAGNOSIS — M542 Cervicalgia: Secondary | ICD-10-CM | POA: Insufficient documentation

## 2014-01-03 NOTE — Assessment & Plan Note (Addendum)
-   OMM on tenderpoint with minimal relief - Consider tender point injection - Obtain Cervical Xray  - If normal, consider MRI  - RESULTS:  Mild degenerative disc disease noted at C5-6 and C6-7 with anterior osteophyte formation - Refill of Flexeril given - Handout for gentle stretches given - Instructed to return after Xray to discuss results
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
## 2014-01-03 NOTE — Assessment & Plan Note (Signed)
-   Refill of Xanax given - Discussed importance of counseling in addition to the medication as best management for anxiety/depression - Discussed that Xanax is not a long-term medication and that she will need to begin tapering the dose soon

## 2014-01-05 NOTE — Telephone Encounter (Signed)
Spoke with patient and informed her of below. Appointment scheduled with Dr. Gerlean Ren Tuesday 11/24 @ 3:30pm

## 2014-01-05 NOTE — Telephone Encounter (Signed)
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 Please have her schedule an appointment so we can further discuss these results and consider an trigger point injection, MRI, or other management.

## 2014-01-19 ENCOUNTER — Encounter: Payer: Self-pay | Admitting: Family

## 2014-01-20 ENCOUNTER — Encounter (INDEPENDENT_AMBULATORY_CARE_PROVIDER_SITE_OTHER): Payer: Medicaid Other | Admitting: Family

## 2014-01-20 ENCOUNTER — Encounter: Payer: Self-pay | Admitting: Family

## 2014-01-20 ENCOUNTER — Ambulatory Visit (HOSPITAL_COMMUNITY)
Admission: RE | Admit: 2014-01-20 | Discharge: 2014-01-20 | Disposition: A | Discharge status: Home / Self Care | Payer: Medicaid Other | Source: Ambulatory Visit | Attending: Family | Admitting: Family

## 2014-01-20 DIAGNOSIS — I70299 Other atherosclerosis of native arteries of extremities, unspecified extremity: Secondary | ICD-10-CM | POA: Diagnosis present

## 2014-01-20 DIAGNOSIS — L97909 Non-pressure chronic ulcer of unspecified part of unspecified lower leg with unspecified severity: Secondary | ICD-10-CM

## 2014-01-23 ENCOUNTER — Other Ambulatory Visit (INDEPENDENT_AMBULATORY_CARE_PROVIDER_SITE_OTHER): Payer: Medicaid Other

## 2014-01-23 DIAGNOSIS — E1165 Type 2 diabetes mellitus with hyperglycemia: Secondary | ICD-10-CM

## 2014-01-23 DIAGNOSIS — E1140 Type 2 diabetes mellitus with diabetic neuropathy, unspecified: Secondary | ICD-10-CM

## 2014-01-23 DIAGNOSIS — IMO0002 Reserved for concepts with insufficient information to code with codable children: Secondary | ICD-10-CM

## 2014-01-23 LAB — HEMOGLOBIN A1C: Hgb A1c MFr Bld: 7.4 % — ABNORMAL HIGH (ref 4.6–6.5)

## 2014-01-23 LAB — COMPREHENSIVE METABOLIC PANEL
ALT: 14 U/L (ref 0–35)
AST: 14 U/L (ref 0–37)
Albumin: 3.8 g/dL (ref 3.5–5.2)
Alkaline Phosphatase: 76 U/L (ref 39–117)
BUN: 32 mg/dL — ABNORMAL HIGH (ref 6–23)
CO2: 28 mEq/L (ref 19–32)
Calcium: 9.3 mg/dL (ref 8.4–10.5)
Chloride: 105 mEq/L (ref 96–112)
Creatinine, Ser: 1.8 mg/dL — ABNORMAL HIGH (ref 0.4–1.2)
GFR: 32.73 mL/min — ABNORMAL LOW (ref >60.00)
Glucose, Bld: 79 mg/dL (ref 70–99)
Potassium: 4.6 mEq/L (ref 3.5–5.1)
Sodium: 142 mEq/L (ref 135–145)
Total Bilirubin: 0.3 mg/dL (ref 0.2–1.2)
Total Protein: 7.3 g/dL (ref 6.0–8.3)

## 2014-01-23 NOTE — Progress Notes (Signed)
Did not see pt.

## 2014-01-24 ENCOUNTER — Ambulatory Visit (INDEPENDENT_AMBULATORY_CARE_PROVIDER_SITE_OTHER): Payer: Medicaid Other | Admitting: Family Medicine

## 2014-01-24 ENCOUNTER — Encounter: Payer: Self-pay | Admitting: Family Medicine

## 2014-01-24 VITALS — BP 188/84 | HR 92 | Temp 98.0°F | Ht 70.0 in | Wt 223.0 lb

## 2014-01-24 DIAGNOSIS — M542 Cervicalgia: Secondary | ICD-10-CM

## 2014-01-24 DIAGNOSIS — M62830 Muscle spasm of back: Secondary | ICD-10-CM

## 2014-01-24 MED ORDER — LISINOPRIL 20 MG PO TABS: 20.0000 mg | ORAL_TABLET | Freq: Every day | ORAL | Status: DC

## 2014-01-24 MED ORDER — OMEPRAZOLE 20 MG PO CPDR: 20.0000 mg | DELAYED_RELEASE_CAPSULE | Freq: Every day | ORAL | Status: DC

## 2014-01-24 MED ORDER — CYCLOBENZAPRINE HCL 10 MG PO TABS: ORAL_TABLET | ORAL | Status: DC

## 2014-01-24 MED ORDER — METHYLPREDNISOLONE ACETATE 40 MG/ML IJ SUSP
40.0000 mg | Freq: Once | INTRAMUSCULAR | Status: AC
  Administered 2014-01-24: 40 mg via INTRAMUSCULAR

## 2014-01-24 NOTE — Patient Instructions (Signed)
Thank you so much for following back up with [place]! Your xray showed arthritis in your neck that may be causing some of your symptoms. We will do a trigger point injection over your trapezius muscle today that should help with the pain. I will refill your Flexeril as well to help with the pain!  Please follow up in one month or sooner if pain is not relieved.  Thanks again! Dr. Gerlean Ren
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
## 2014-01-26 NOTE — Assessment & Plan Note (Signed)
-   Trigger point injection today  - Written consent was obtained  - Sterilized with alcohol swabs. Injected with 1:1 mixture of methylprednisolone and lidocaine  - Injected two trigger points along trapezius - Refilled Flexeril  - Return in one month or sooner if pain does not improve

## 2014-01-30 ENCOUNTER — Other Ambulatory Visit: Payer: Self-pay | Admitting: *Deleted

## 2014-01-30 ENCOUNTER — Encounter: Payer: Self-pay | Admitting: Endocrinology

## 2014-01-30 ENCOUNTER — Ambulatory Visit (INDEPENDENT_AMBULATORY_CARE_PROVIDER_SITE_OTHER): Payer: Medicaid Other | Admitting: Endocrinology

## 2014-01-30 VITALS — BP 182/94 | HR 82 | Temp 98.2°F | Resp 14 | Ht 70.0 in | Wt 220.0 lb

## 2014-01-30 DIAGNOSIS — E1121 Type 2 diabetes mellitus with diabetic nephropathy: Secondary | ICD-10-CM

## 2014-01-30 DIAGNOSIS — N289 Disorder of kidney and ureter, unspecified: Secondary | ICD-10-CM

## 2014-01-30 DIAGNOSIS — I10 Essential (primary) hypertension: Secondary | ICD-10-CM

## 2014-01-30 DIAGNOSIS — E1165 Type 2 diabetes mellitus with hyperglycemia: Secondary | ICD-10-CM

## 2014-01-30 DIAGNOSIS — E1140 Type 2 diabetes mellitus with diabetic neuropathy, unspecified: Secondary | ICD-10-CM

## 2014-01-30 DIAGNOSIS — IMO0002 Reserved for concepts with insufficient information to code with codable children: Secondary | ICD-10-CM

## 2014-01-30 MED ORDER — LIRAGLUTIDE 18 MG/3ML ~~LOC~~ SOPN: PEN_INJECTOR | SUBCUTANEOUS | Status: DC

## 2014-01-30 MED ORDER — DILTIAZEM HCL ER COATED BEADS 180 MG PO CP24: 180.0000 mg | ORAL_CAPSULE | Freq: Every day | ORAL | Status: DC

## 2014-01-30 NOTE — Patient Instructions (Addendum)
Lisinopril 1/2 daily and start Diltiazem   Start VICTOZA injection with the pen once daily at the same time of the day.  Dial the dose to 0.6 mg for the first week.  You may possibly experience nausea in the first few days which usually gets better in a couple of days After 1 week increase the dose by 5 clicks then to 1.2mg  daily if no nausea present.  You may inject in the stomach, thigh or arm.   You will feel fullness of the stomach with starting the medication and should try to keep portions of food small.  Call us or the [facility] helpline at [phone] or visit [email or web address] for any questions

## 2014-01-30 NOTE — Progress Notes (Signed)
Patient ID: [name], female   DOB: November 02, 1965, 48 y.o.   MRN: [id number]    Reason for Appointment: Followup for Type 2 Diabetes  History of Present Illness:          Diagnosis: Type 2 diabetes mellitus, date of diagnosis: 1992        Past history: She had symptomatic hyperglycemia at onset and was treated with metformin and Amaryl. However because of GI side effects from metformin this was not continued  And she thinks she was continued on Amaryl for several years. Subsequently in 2002 when she was pregnant she was treated with insulin  After her pregnancy insulin was stopped and she was back on oral agents Insulin was started again about 3 years ago because of poor control. She does not think she tried any other medications She has been on various insulin regimens in the last 3 years including NovoLog, Novolin 70/30, NovoLog mix 70/30 and Lantus  In 9/14 because of tendency to weight gain she was given a sample of Byetta 5 mcg to try but she could not tolerate this for over 2 weeks because of significant nausea. Has not tried Victoza previously She has had persistently poor control in the past with A1c at least 10% since about 2012 She had been taking NovoLog mix 70/30 since about 06/2012 along with Lantus  Recent history:  She has been taking a regimen of Lantus and pre-meal NovoLog However she did not start her Invokana because of renal dysfunction With increasing her insulin doses her blood sugars have improved progressively However she is starting to gain weight and she is concerned about this She has limited ability to to exercise because of her neuropathy She is taking about the same amount of insulin for her meals and usually does not adjust it by more than 2 units Also appears to be sometimes not taking the insulin at mealtimes when she is getting a normal blood sugar Her Accu-Chek monitor was used part of the time and did not have complete set of her home glucose readings  today.  She is using the Prodigy sometimes also  Hypoglycemia: She had low sugars on about 2 days earlier this month, twice early morning and once at 2 PM      Oral hypoglycemic drugs the patient is taking are: None      Side effects from medications have been: Metformin caused diarrhea INSULIN regimen is described as: NovoLog 12-14,    Lantus 50 hs  Glucose monitoring:  2.1 times a day       Glucometer:  Accu-Chek Aviva plus or Prodigy   Blood Glucose readings from monitor   PRE-MEAL Breakfast Lunch Dinner Bedtime Overall  Glucose range:  56-170   45-182   87-176   80-126    Average:  115   146     121    Glycemic control:   Lab Results  Component Value Date   HGBA1C 7.4* 01/23/2014   HGBA1C 10.1* 11/29/2013   HGBA1C 8.3* 07/04/2013   Lab Results  Component Value Date   MICROALBUR 208.8* 07/04/2013   LDLCALC 58 07/04/2013   CREATININE 1.8* 01/23/2014    Self-care: The diet that the patient has been following is: tries to limit carbohydrates especially at lunchtime. At breakfast she will have eggs and toast, usually will have a meat protein, cheese or egg at lunchtime usually no carbohydrate, dinner has a mixed meal with some carbohydrate. Usually avoiding high fat meals and fried  food. Eating outside infrequently       Meals: 3 meals per day. Dinner 5-6 pm         Exercise:  treadmill now for about 30 minutes, uses this slowly Dietician visit: Most recent: At diagnosis.               Compliance with the medical regimen: Fair Complications: Peripheral neuropathy with sensory loss and history of diabetic foot ulcer Retinal exam: Most recent:.2014     Weight history: Previous range 170-218  Wt Readings from Last 3 Encounters:  01/30/14 220 lb (99.791 kg)  01/24/14 223 lb (101.152 kg)  01/20/14 219 lb (99.338 kg)      Medication List       This list is accurate as of: 01/30/14  4:13 PM.  Always use your most recent med list.               ACCU-CHEK AVIVA PLUS  W/DEVICE Kit  CHECK BLOOD SUGARS THREE TIMES A DAY.  DX:250.03     ALPRAZolam 0.5 MG tablet  Commonly known as:  XANAX  Take 1 tablet (0.5 mg total) by mouth at bedtime as needed for anxiety.     canagliflozin 100 MG Tabs tablet  Commonly known as:  INVOKANA  Take 1 tablet daily     cyclobenzaprine 10 MG tablet  Commonly known as:  FLEXERIL  TAKE ONE TABLET BY MOUTH THREE TIMES DAILY AS NEEDED FOR MUSCLE SPASM     DULoxetine 30 MG capsule  Commonly known as:  CYMBALTA  Take 1 capsule (30 mg total) by mouth daily.     glucose blood test strip  Commonly known as:  ACCU-CHEK AVIVA PLUS  USE TO CHECK BLOOD SUGAR BEFORE MEALS AND AT BEDTIME dx code E10.65     insulin aspart 100 UNIT/ML FlexPen  Commonly known as:  NOVOLOG  12-14 Units.     Insulin Glargine 100 UNIT/ML Solostar Pen  Commonly known as:  LANTUS  50 Units.     lisinopril 20 MG tablet  Commonly known as:  PRINIVIL,ZESTRIL  Take 1 tablet (20 mg total) by mouth daily.     omeprazole 20 MG capsule  Commonly known as:  PRILOSEC  Take 1 capsule (20 mg total) by mouth daily.     pregabalin 100 MG capsule  Commonly known as:  LYRICA  Take 1 capsule (100 mg total) by mouth 2 (two) times daily.     traMADol 50 MG tablet  Commonly known as:  ULTRAM  Take 1 tablet (50 mg total) by mouth every 8 (eight) hours as needed.        Allergies:  Allergies  Allergen Reactions  . Latex Rash    Past Medical History  Diagnosis Date  . Diabetes type 1, uncontrolled   . Chest tightness   . Urinary frequency   . Nausea and vomiting in adult   . Diabetic neuropathy   . Essential hypertension   . Yeast vaginitis   . GERD (gastroesophageal reflux disease)     Past Surgical History  Procedure Laterality Date  . Endometrial ablation    . Amputation Left 06/15/2012    Procedure: AMPUTATION DIGIT;  Surgeon: Meredith Pel, MD;  Location: [facility];  Service: Orthopedics;  Laterality: Left;  Left great toe revision  amputation  . Achilles tendon surgery Left 03/10/2013    Procedure: LEFT CHOPART AMPUTATION/ LEFT TENDON ACHILLES [place];  Surgeon: Wylene Simmer, MD;  Location: [facility];  Service: Orthopedics;  Laterality: Left;    Family History  Problem Relation Age of Onset  . Diabetes Mother   . Heart attack Mother   . Heart disease Mother     before age 26  . Hypertension Mother   . Hyperlipidemia Mother   . Diabetes Father   . Heart attack Father   . Heart disease Father     before age 94    Social History:  reports that she has been smoking Cigarettes.  She has a 11 pack-year smoking history. She has never used smokeless tobacco. She reports that she drinks alcohol. She reports that she does not use illicit drugs.    Review of Systems       Lipids: She has never been on medications      Lab Results  Component Value Date   CHOL 195 07/04/2013   HDL 29.50* 07/04/2013   LDLCALC 58 07/04/2013   LDLDIRECT 106* 11/20/2011   TRIG * 07/04/2013    538.0 Triglyceride is over 400; calculations on Lipids are invalid.   CHOLHDL 7 07/04/2013    Hypertension: Currently on lisinopril 20 mg  Renal dysfunction: Her creatinine    NEUROPATHY: She had been complaining about continued pain with neuropathy in her legs and feet. She has had better relief of her pain with Lyrica compared to gabapentin She is also  taking 30 mg of Cymbalta   Physical Examination:  BP 182/94 mmHg  Pulse 82  Temp(Src) 98.2 F (36.8 C)  Resp 14  Ht _0  (1.778 m)  Wt 220 lb (99.791 kg)  BMI 31.57 kg/m2  SpO2 93%     ASSESSMENT:  Diabetes type 2 with mild obesity   As discussed in history of present illness she is having fairly good blood sugar control with a stable dose of Lantus and NovoLog now She does try to take her insulin as directed but does not understand the need for taking at least some NovoLog to cover meals even when the blood sugars are normal before the meal and this may cause some postprandial  hyperglycemia Unable to start Invokana because of renal dysfunction She is continuing to have weight gain with insulin alone and cannot tolerate metformin Her A1c is improving although still not below 7, previously 10%  NEUROPATHY: She is getting some relief of her pain with Cymbalta and Lyrica  RENAL dysfunction: Creatinine is still 1.8 Does have some diabetic nephropathy but not clear why her creatinine is relatively higher now, previously as low as 1.37.  Has not taken any nonsteroidal anti-inflammatory drugs  HYPERTENSION: Currently poorly controlled with lisinopril alone, does not appear to be fluid overloaded  PLAN:   To take NovoLog with each meal regardless of baseline blood sugar, may reduce the dose if blood sugar below 100 pre-meal.  Try to use Accu-Chek more consistently instead of 2 different monitors  Continue increasing exercise as tolerated  More blood sugars after supper  Consider reducing Lantus if fasting blood sugars are lower Discussed with the patient the nature of GLP-1 drugs, the action on various organ systems, how they benefit blood glucose control, as well as the benefit of weight loss and  increase satiety . Explained possible side effects especially nausea and vomiting; discussed safety information in package insert. Described injection technique and dosage titration of Victoza  starting with 0.6 mg once a day at the same time for the first week and then increasing to 1.2 mg if no symptoms of nausea. Patient brochure on Victoza  given Probably will need to reduce all her insulin doses if Victoza as effective, may need prior authorization Start diltiazem for hypertension and reduce lisinopril to half tablet  Nephrology consultation  Counseling time over 50% of today's 25 minute visit  Breeonna Mone 01/30/2014, 4:13 PM

## 2014-01-31 ENCOUNTER — Encounter: Payer: Self-pay | Admitting: Family

## 2014-02-01 ENCOUNTER — Ambulatory Visit: Payer: Medicaid Other | Admitting: Family

## 2014-02-14 ENCOUNTER — Ambulatory Visit (INDEPENDENT_AMBULATORY_CARE_PROVIDER_SITE_OTHER): Payer: Medicaid Other | Admitting: Family Medicine

## 2014-02-14 ENCOUNTER — Encounter: Payer: Self-pay | Admitting: Family Medicine

## 2014-02-14 VITALS — BP 153/69 | HR 80 | Temp 98.1°F | Ht 70.0 in | Wt 221.6 lb

## 2014-02-14 DIAGNOSIS — F329 Major depressive disorder, single episode, unspecified: Secondary | ICD-10-CM

## 2014-02-14 DIAGNOSIS — F419 Anxiety disorder, unspecified: Secondary | ICD-10-CM

## 2014-02-14 DIAGNOSIS — I10 Essential (primary) hypertension: Secondary | ICD-10-CM

## 2014-02-14 DIAGNOSIS — F32A Depression, unspecified: Secondary | ICD-10-CM

## 2014-02-14 DIAGNOSIS — M7552 Bursitis of left shoulder: Secondary | ICD-10-CM

## 2014-02-14 DIAGNOSIS — K589 Irritable bowel syndrome without diarrhea: Secondary | ICD-10-CM

## 2014-02-14 DIAGNOSIS — F418 Other specified anxiety disorders: Secondary | ICD-10-CM

## 2014-02-14 MED ORDER — DIPHENOXYLATE-ATROPINE 2.5-0.025 MG PO TABS: 2.0000 | ORAL_TABLET | Freq: Three times a day (TID) | ORAL | Status: DC

## 2014-02-14 MED ORDER — MELOXICAM 7.5 MG PO TABS: 7.5000 mg | ORAL_TABLET | Freq: Every day | ORAL | Status: DC

## 2014-02-14 NOTE — Assessment & Plan Note (Signed)
-   List of therapists in the area given - Contact information given for [facility]. Discussed bereavement counseling.

## 2014-02-14 NOTE — Patient Instructions (Signed)
Thank you so much for coming to visit me today!  We will give you a pamphlet of exercises that should help with the pain.  I will also send in some medication to help with the inflammation and pain.  Please return for an injection if you decide you would like one!  I will give you list of therapists in the area you can contact.  You may also contact [facility] for bereavement counseling.  I will give you the contact information for this as well.  I will send in something to help with your IBS.  Thanks again! Dr. Gerlean Ren

## 2014-02-14 NOTE — Assessment & Plan Note (Signed)
-   Initial BP elevated at 179/71. BP rechecked and improved to 153/69. - Being followed by Internal Medicine for HTN and DM management - Recently started on Diltiazem 180mg  and Lisinopril 20mg

## 2014-02-14 NOTE — Assessment & Plan Note (Signed)
-   Xifaxan expensive. Interested in trying another medication. - Has tried Imodium in the past - Prescription for Lomotil printed after visit and left at front for Mrs. Tooley to pick up.

## 2014-02-14 NOTE — Progress Notes (Signed)
Subjective:     Patient ID: [name], female   DOB: 05/24/1965, 48 y.o.   MRN: [id number]  HPI [name] presents for follow up of neck and left shoulder pain.  # Neck and Right Shoulder Pain: - At last visit on 11/30, [name] pain was located over the left trapezius. Trigger point injection over trapezius at this visit. - At first states pain is unchanged. Further questioning reveals pain in neck and trapezius has significantly improve. Pain is now located in left shoulder. - Pain extends down left arm to elbow. - Numbness in third, fourth, and fifth digits of left arm. - Notes occasional stiffness in third and fourth digit - State it hurts to lie on shoulder and the pain wakes her up at night - Denies weakness   # IBD: - Has history of IBD diarrhea predominant - Has tried Imodium in past with some relief, however she continues to have diarrhea and would like to try another medication - Has seen commercials for Xifaxan and would like to discuss this  # Anxiety and Depression: - Would like information concerning therapists in area - First holidays without her mother.   Review of Systems  Musculoskeletal: Positive for myalgias and neck pain.  All other systems reviewed and are negative.  Social History Reviewed    Objective:   Physical Exam  Constitutional: She appears well-developed and well-nourished. No distress.  HENT:  Head: Normocephalic and atraumatic.  Cardiovascular: Normal rate, regular rhythm and normal heart sounds.  Exam reveals no gallop and no friction rub.   No murmur heard. Pulmonary/Chest: Effort normal. No respiratory distress. She has no wheezes. She has no rales.  Abdominal: Soft. Bowel sounds are normal. She exhibits no distension. There is no tenderness. There is no rebound.  Musculoskeletal: She exhibits no edema.  No cervical midline tenderness. Muscle strength 5/5 in upper extremities bilaterally. + Hawkins. Mild pain with palpation of  heads of biceps. Significant pain with palpation of acromion. No pain with palpation of trapezius.  Skin: She is not diaphoretic.      Assessment:     Please refer to Problem List for Assessment.    Plan:     Please refer to Problem List for Plan.

## 2014-02-14 NOTE — Assessment & Plan Note (Addendum)
-   Trapezius pain improved. Pain now located in left shoulder. - Shoulder injection offered. Denied, but states she will consider it at future visits. 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 Will schedule next visit at time when afternoon is free so she can have injection done. - Given shoulder exercises to do at home. Resistance band given. - Prescription for Mobic 7.5mg  given to help with inflammation and pain - Consider orthopedics referral if not improvement - Follow up for injection

## 2014-02-17 ENCOUNTER — Other Ambulatory Visit: Payer: Self-pay | Admitting: *Deleted

## 2014-02-17 DIAGNOSIS — H9202 Otalgia, left ear: Secondary | ICD-10-CM

## 2014-02-17 MED ORDER — TRAMADOL HCL 50 MG PO TABS: 50.0000 mg | ORAL_TABLET | Freq: Three times a day (TID) | ORAL | Status: DC | PRN

## 2014-02-17 MED ORDER — ALPRAZOLAM 0.5 MG PO TABS: 0.5000 mg | ORAL_TABLET | Freq: Every evening | ORAL | Status: DC | PRN

## 2014-03-07 ENCOUNTER — Telehealth: Payer: Self-pay | Admitting: Family Medicine

## 2014-03-07 NOTE — Telephone Encounter (Signed)
Pt wants to know about the Lomotil. She wants to know why this is only for r 5 days.  Should it not be for longer? Please advise

## 2014-03-09 MED ORDER — DIPHENOXYLATE-ATROPINE 2.5-0.025 MG PO TABS: 2.0000 | ORAL_TABLET | Freq: Three times a day (TID) | ORAL | Status: DC

## 2014-03-09 NOTE — Telephone Encounter (Signed)
Please let [name] know I have printed out new prescription and left at front desk. Please have her call office after taking medication for one week so we can see how effective it is. If it is effective, we will attempt to decrease dose to one tablet TID.

## 2014-03-09 NOTE — Telephone Encounter (Signed)
[name] called back regarding the Lomotil rx.  She left it here some weeks back and still haven't heard from the provider.  I informed her today as to what it was for.  She was appreciative and need to have it called in to her pharmacy.

## 2014-03-10 NOTE — Telephone Encounter (Signed)
Spoke with patient and inform her of below

## 2014-03-13 NOTE — Telephone Encounter (Signed)
Pt wants to know if [name] can call this prescription in, she thought that was what was being done but the pharmacy has not received it.

## 2014-03-13 NOTE — Telephone Encounter (Signed)
Called Rx into pharmacy.  

## 2014-03-23 ENCOUNTER — Telehealth: Payer: Self-pay | Admitting: Family Medicine

## 2014-03-23 NOTE — Telephone Encounter (Signed)
Pt called and would like to speak to [place]. She really wants to change her physician to a faculty doctor. jw

## 2014-03-29 ENCOUNTER — Other Ambulatory Visit: Payer: Medicaid Other

## 2014-03-29 ENCOUNTER — Other Ambulatory Visit (INDEPENDENT_AMBULATORY_CARE_PROVIDER_SITE_OTHER): Payer: Medicaid Other

## 2014-03-29 DIAGNOSIS — IMO0002 Reserved for concepts with insufficient information to code with codable children: Secondary | ICD-10-CM

## 2014-03-29 DIAGNOSIS — E1165 Type 2 diabetes mellitus with hyperglycemia: Secondary | ICD-10-CM

## 2014-03-29 DIAGNOSIS — E1140 Type 2 diabetes mellitus with diabetic neuropathy, unspecified: Secondary | ICD-10-CM

## 2014-03-29 LAB — BASIC METABOLIC PANEL
BUN: 29 mg/dL — ABNORMAL HIGH (ref 6–23)
CO2: 31 mEq/L (ref 19–32)
Calcium: 9.6 mg/dL (ref 8.4–10.5)
Chloride: 101 mEq/L (ref 96–112)
Creatinine, Ser: 1.61 mg/dL — ABNORMAL HIGH (ref 0.40–1.20)
GFR: 36.48 mL/min — ABNORMAL LOW (ref >60.00)
Glucose, Bld: 118 mg/dL — ABNORMAL HIGH (ref 70–99)
Potassium: 4.4 mEq/L (ref 3.5–5.1)
Sodium: 139 mEq/L (ref 135–145)

## 2014-03-29 LAB — URINALYSIS, ROUTINE W REFLEX MICROSCOPIC
Bilirubin Urine: NEGATIVE
Ketones, ur: NEGATIVE
Leukocytes, UA: NEGATIVE
Nitrite: NEGATIVE
Specific Gravity, Urine: 1.02 (ref 1.000–1.030)
Total Protein, Urine: 100 — AB
Urine Glucose: NEGATIVE
Urobilinogen, UA: 0.2 (ref 0.0–1.0)
pH: 7.5 (ref 5.0–8.0)

## 2014-03-29 LAB — LIPID PANEL
Cholesterol: 173 mg/dL (ref 0–200)
HDL: 31.6 mg/dL — ABNORMAL LOW (ref >39.00)
NonHDL: 141.4
Total CHOL/HDL Ratio: 5
Triglycerides: 318 mg/dL — ABNORMAL HIGH (ref 0.0–149.0)
VLDL: 63.6 mg/dL — ABNORMAL HIGH (ref 0.0–40.0)

## 2014-03-29 LAB — HEMOGLOBIN A1C: Hgb A1c MFr Bld: 7.1 % — ABNORMAL HIGH (ref 4.6–6.5)

## 2014-03-29 LAB — LDL CHOLESTEROL, DIRECT: Direct LDL: 101 mg/dL

## 2014-03-30 LAB — MICROALBUMIN / CREATININE URINE RATIO
Creatinine,U: 102.6 mg/dL
Microalb Creat Ratio: 84.7 mg/g — ABNORMAL HIGH (ref 0.0–30.0)
Microalb, Ur: 86.9 mg/dL — ABNORMAL HIGH (ref 0.0–1.9)

## 2014-04-03 ENCOUNTER — Encounter: Payer: Self-pay | Admitting: Endocrinology

## 2014-04-03 ENCOUNTER — Ambulatory Visit (INDEPENDENT_AMBULATORY_CARE_PROVIDER_SITE_OTHER): Payer: Medicaid Other | Admitting: Endocrinology

## 2014-04-03 VITALS — BP 142/78 | HR 80 | Temp 98.2°F | Resp 14 | Ht 70.0 in | Wt 216.2 lb

## 2014-04-03 DIAGNOSIS — I10 Essential (primary) hypertension: Secondary | ICD-10-CM

## 2014-04-03 DIAGNOSIS — E1121 Type 2 diabetes mellitus with diabetic nephropathy: Secondary | ICD-10-CM

## 2014-04-03 DIAGNOSIS — E1165 Type 2 diabetes mellitus with hyperglycemia: Secondary | ICD-10-CM

## 2014-04-03 DIAGNOSIS — IMO0002 Reserved for concepts with insufficient information to code with codable children: Secondary | ICD-10-CM

## 2014-04-03 DIAGNOSIS — E1140 Type 2 diabetes mellitus with diabetic neuropathy, unspecified: Secondary | ICD-10-CM

## 2014-04-03 DIAGNOSIS — N289 Disorder of kidney and ureter, unspecified: Secondary | ICD-10-CM

## 2014-04-03 NOTE — Patient Instructions (Addendum)
Victoza 0.6mg  for a week to see if nausea better  Leave off Meloxicam  Novolog 10 in am and 8-10 for smaller meals  Please check blood sugars at least half the time about 2 hours after any meal and 3 times per week on waking up. Please bring blood sugar monitor to each visit. Recommended blood sugar levels about 2 hours after meal is 140-180 and on waking up 90-130

## 2014-04-03 NOTE — Progress Notes (Signed)
Patient ID: [name], female   DOB: [birth year], 49 y.o.   MRN: [id number]    Reason for Appointment: Followup for Type 2 Diabetes  History of Present Illness:          Diagnosis: Type 2 diabetes mellitus, date of diagnosis: 1992        Past history: She had symptomatic hyperglycemia at onset and was treated with metformin and Amaryl. However because of GI side effects from metformin this was not continued  And she thinks she was continued on Amaryl for several years. Subsequently in 2002 when she was pregnant she was treated with insulin  After her pregnancy insulin was stopped and she was back on oral agents Insulin was started again about 3 years ago because of poor control. She does not think she tried any other medications She has been on various insulin regimens in the last 3 years including NovoLog, Novolin 70/30, NovoLog mix 70/30 and Lantus  In 9/14 because of tendency to weight gain she was given a sample of Byetta 5 mcg to try but she could not tolerate this for over 2 weeks because of significant nausea. Has not tried Victoza previously She has had persistently poor control in the past with A1c at least 10% since about 2012 She had been taking NovoLog mix 70/30 since about 06/2012 along with Lantus  Recent history:  Victoza was started on her last visit in 11/15 but she has not followed up until today She says that she has taken 1.2 mg after titrating it as directed and had no problems with this Only in the last 2 weeks she is complaining about nausea off and on She thinks her blood sugars are better and she is controlling her portions and weight better; previously was concerned about her continued weight gain She has been taking the same regimen of Lantus and pre-meal NovoLog and has not reduced the doses. Also is doing better with taking her NovoLog before every meal regardless of pre-meal blood sugar as directed on her last visit. A1c is improving Only last night she had  a low sugar overnight probably because she had only very small meal at suppertime and took the same amount of insulin for supper and bedtime She has not brought her monitor for download today, currently using her mother's monitor since her Accu-Chek does not work  Hypoglycemia: She had low sugar at 3 am last night      Oral hypoglycemic drugs the patient is taking are: None      Side effects from medications have been: Metformin caused diarrhea  INSULIN regimen is described as: NovoLog 12-14,    Lantus 50 hs  Glucose monitoring:  2.1 times a day       Glucometer:  Accu-Chek Aviva  Blood Glucose readings from recall  Am 90-115 acl 116, acs 130 130  Glycemic control:   Lab Results  Component Value Date   HGBA1C 7.1* 03/29/2014   HGBA1C 7.4* 01/23/2014   HGBA1C 10.1* 11/29/2013   Lab Results  Component Value Date   MICROALBUR 86.9* 03/29/2014   LDLCALC 58 07/04/2013   CREATININE 1.61* 03/29/2014    Self-care: The diet that the patient has been following is: tries to limit carbohydrates especially at lunchtime. At breakfast she will have eggs and toast, usually will have a meat protein, cheese or egg at lunchtime usually no carbohydrate, dinner has a mixed meal with some carbohydrate. Usually avoiding high fat meals and fried food. Eating outside infrequently  Meals: 3 meals per day. Dinner 5-6 pm         Exercise:  treadmill now for about 30 minutes, uses this slowly 3/7 Dietician visit: Most recent: At diagnosis.               Compliance with the medical regimen: Fair Complications: Peripheral neuropathy with sensory loss and history of diabetic foot ulcer Retinal exam: Most recent:.2014     Weight history: Previous range 170-218  Wt Readings from Last 3 Encounters:  04/03/14 216 lb 3.2 oz (98.068 kg)  02/14/14 221 lb 9.6 oz (100.517 kg)  01/30/14 220 lb (99.791 kg)      Medication List       This list is accurate as of: 04/03/14 10:16 AM.  Always use your most  recent med list.               ACCU-CHEK AVIVA PLUS W/DEVICE Kit  CHECK BLOOD SUGARS THREE TIMES A DAY.  DX:250.03     ALPRAZolam 0.5 MG tablet  Commonly known as:  XANAX  Take 1 tablet (0.5 mg total) by mouth at bedtime as needed for anxiety.     cyclobenzaprine 10 MG tablet  Commonly known as:  FLEXERIL  TAKE ONE TABLET BY MOUTH THREE TIMES DAILY AS NEEDED FOR MUSCLE SPASM     diltiazem 180 MG 24 hr capsule  Commonly known as:  CARDIZEM CD  Take 1 capsule (180 mg total) by mouth daily.     diphenoxylate-atropine 2.5-0.025 MG per tablet  Commonly known as:  LOMOTIL  Take 2 tablets by mouth 3 (three) times daily before meals. Please take thirty (30) minutes prior to meal.     DULoxetine 30 MG capsule  Commonly known as:  CYMBALTA  Take 1 capsule (30 mg total) by mouth daily.     glucose blood test strip  Commonly known as:  ACCU-CHEK AVIVA PLUS  USE TO CHECK BLOOD SUGAR BEFORE MEALS AND AT BEDTIME dx code E10.65     insulin aspart 100 UNIT/ML FlexPen  Commonly known as:  NOVOLOG  12-14 Units.     Insulin Glargine 100 UNIT/ML Solostar Pen  Commonly known as:  LANTUS  50 Units.     Liraglutide 18 MG/3ML Sopn  Commonly known as:  VICTOZA  Inject 0.6 mg daily for the first week, then inject 1.2 mg daily     lisinopril 20 MG tablet  Commonly known as:  PRINIVIL,ZESTRIL  Take 1 tablet (20 mg total) by mouth daily.     meloxicam 7.5 MG tablet  Commonly known as:  MOBIC  Take 1 tablet (7.5 mg total) by mouth daily.     omeprazole 20 MG capsule  Commonly known as:  PRILOSEC  Take 1 capsule (20 mg total) by mouth daily.     pregabalin 100 MG capsule  Commonly known as:  LYRICA  Take 1 capsule (100 mg total) by mouth 2 (two) times daily.     traMADol 50 MG tablet  Commonly known as:  ULTRAM  Take 1 tablet (50 mg total) by mouth every 8 (eight) hours as needed.        Allergies:  Allergies  Allergen Reactions  . Latex Rash    Past Medical History   Diagnosis Date  . Diabetes type 1, uncontrolled   . Chest tightness   . Urinary frequency   . Nausea and vomiting in adult   . Diabetic neuropathy   . Essential hypertension   . Yeast vaginitis   .  GERD (gastroesophageal reflux disease)     Past Surgical History  Procedure Laterality Date  . Endometrial ablation    . 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 Achilles tendon surgery Left 03/10/2013    Procedure: LEFT CHOPART AMPUTATION/ LEFT TENDON ACHILLES [place];  Surgeon: Wylene Simmer, MD;  Location: [facility];  Service: Orthopedics;  Laterality: Left;    Family History  Problem Relation Age of Onset  . Diabetes Mother   . Heart attack Mother   . Heart disease Mother     before age 28  . Hypertension Mother   . Hyperlipidemia Mother   . Diabetes Father   . Heart attack Father   . Heart disease Father     before age 63    Social History:  reports that she has been smoking Cigarettes.  She has a 11 pack-year smoking history. She has never used smokeless tobacco. She reports that she drinks alcohol. She reports that she does not use illicit drugs.    Review of Systems       Lipids: She has never been on medications      Lab Results  Component Value Date   CHOL 173 03/29/2014   HDL 31.60* 03/29/2014   LDLCALC 58 07/04/2013   LDLDIRECT 101.0 03/29/2014   TRIG 318.0* 03/29/2014   CHOLHDL 5 03/29/2014    Hypertension: Currently on lisinopril 20 mg and diltiazem was added on her previous visit for significantly high blood pressure and microalbuminuria  Taking Mobic for neck pain for 1 month from her PCP Renal dysfunction: Her creatinine is better than her previous level but still not back to normal. Also her microalbumin level is slightly better than before but not normal  Lab Results  Component Value Date   CREATININE  1.61* 03/29/2014     NEUROPATHY: She had been complaining about continued pain with neuropathy in her legs and feet. She has had better relief of her pain with Lyrica compared to gabapentin She is also  taking 30 mg of Cymbalta   Physical Examination:  BP 194/92 mmHg  Pulse 80  Temp(Src) 98.2 F (36.8 C)  Resp 14  Ht _0  (1.778 m)  Wt 216 lb 3.2 oz (98.068 kg)  BMI 31.02 kg/m2  SpO2 97%  Repeat blood pressure 142/78    No ankle edema  ASSESSMENT:  Diabetes type 2 with mild obesity   As discussed in history of present illness she is having somewhat better controlled with adding Victoza to her basal bolus insulin regimen  fairly good blood sugar control with a stable dose of Lantus and NovoLog now She does try to take her insulin as directed but does not understand the need for taking at least some NovoLog to cover meals even when the blood sugars are normal before the meal and this may cause some postprandial hyperglycemia Unable to start Invokana because of renal dysfunction She is continuing to have weight gain with insulin alone and cannot tolerate metformin Her A1c is improving although still not below 7, previously 10%  RENAL dysfunction/microalbuminuria: Creatinine is still high but better than 1.8 before   Has  taken nonsteroidal anti-inflammatory drug for the last month and this may be playing a role Has slightly better microalbumin level  HYPERTENSION: Currently better controlled with lisinopril 20 mgand adding diltiazem 180 mg and will continue  No hyperkalemia  PLAN:   To reduce Victoza for week to 0.6 mg to see if nausea is reduced.  If this is eliminating her nausea she can try increasing it to 0.9 mg and then eventually to 1.2 again.  She probably can take a little less NovoLog at breakfast time and also when she is eating smaller meals and less carbohydrate.  Try to use Accu-Chek more consistently and she was given a new meter today  Continue  increasing exercise as tolerated  More blood sugars after meals for review  Consider reducing Lantus if fasting blood sugars are lower Discussed with the patient the need for avoiding nonsteroidal anti-inflammatory drugs with her renal dysfunction and she will try to discuss alternatives with PCP   Nephrology consultation pending  Counseling time over 50% of today's 25 minute visit  Patient Instructions  Victoza 0.20m for a week to see if nausea better  Leave off Meloxicam  Novolog 10 in am and 8-10 for smaller meals  Please check blood sugars at least half the time about 2 hours after any meal and 3 times per week on waking up. Please bring blood sugar monitor to each visit. Recommended blood sugar levels about 2 hours after meal is 140-180 and on waking up 90-130       [name] 04/03/2014, 10:16 AM

## 2014-04-07 NOTE — Telephone Encounter (Signed)
Spoke with patient and she is requesting to be assigned to a faculty doctor.  Patient states that she has been seeing residents and "is tired of having to start all over with a new doctor."  States that she has diabetes, is complicated, and has been coming here for years.  Was seen by Dr. Gerlean Ren around September 2015 and was told that she needed to be "weaned off Xanax and Valium."  Had recently lost her mother and was in no position to be weaned off med.  Discussed process to request changing PCP.    Will route request to Drs. Ovidio Kin, and Rippey and follow up with patient next week.  Patient verbalized understanding. Burna Forts, BSN, RN-BC

## 2014-04-10 ENCOUNTER — Telehealth: Payer: Self-pay | Admitting: Family Medicine

## 2014-04-10 NOTE — Telephone Encounter (Signed)
Needs refill on alpraolam  [facility] on [address]

## 2014-04-12 MED ORDER — ALPRAZOLAM 0.5 MG PO TABS: 0.5000 mg | ORAL_TABLET | Freq: Every evening | ORAL | Status: DC | PRN

## 2014-04-13 NOTE — Telephone Encounter (Signed)
Med was refilled per Dr. Gerlean Ren (printed Rx).  Called in Rx to [facility] and destroyed printed copy.  Burna Forts, BSN, RN-BC

## 2014-04-18 ENCOUNTER — Encounter: Payer: Self-pay | Admitting: *Deleted

## 2014-04-18 NOTE — Progress Notes (Signed)
Patient had previously requested to change PCP to faculty.  Does not want resident as PCP.  Discussed with Drs. Nori Riis and Kane--unable to change PCP to faculty due to faculty providers not taking new patients.   Spoke with patient last week and let her know that faculty providers are not taking new patients and unfortunately, her switching is not an option.  Patient declined to stay with current PCP or switch to another resident here.  I am currently working on getting her a list of providers that accept Medicaid and will contact her in a few days.  Patient agreeable to plan and verbalized understanding.  Burna Forts, BSN, RN-BC

## 2014-04-26 ENCOUNTER — Other Ambulatory Visit: Payer: Self-pay | Admitting: Nephrology

## 2014-04-26 DIAGNOSIS — N183 Chronic kidney disease, stage 3 unspecified: Secondary | ICD-10-CM

## 2014-04-26 NOTE — Progress Notes (Signed)
Spoke with patient and gave name of 2 offices that may be taking Medicaid--[facility] and [facility].  Also, informed to check with her caseworker for additional offices accepting new Medicaid patients. Patient understands that [facility] will continue to be listed as her PCP until she establishes care with another offices and completes release of information form.   Burna Forts, BSN, RN-BC

## 2014-05-02 ENCOUNTER — Ambulatory Visit
Admission: RE | Admit: 2014-05-02 | Discharge: 2014-05-02 | Disposition: A | Discharge status: Home / Self Care | Payer: Medicaid Other | Source: Ambulatory Visit | Attending: Nephrology | Admitting: Nephrology

## 2014-05-02 DIAGNOSIS — N183 Chronic kidney disease, stage 3 unspecified: Secondary | ICD-10-CM

## 2014-05-08 ENCOUNTER — Encounter: Payer: Self-pay | Admitting: Family Medicine

## 2014-05-08 ENCOUNTER — Ambulatory Visit: Payer: Medicaid Other | Attending: Family Medicine | Admitting: Family Medicine

## 2014-05-08 VITALS — BP 146/82 | HR 78 | Temp 98.6°F | Resp 18 | Ht 70.0 in | Wt 222.0 lb

## 2014-05-08 DIAGNOSIS — Z89432 Acquired absence of left foot: Secondary | ICD-10-CM | POA: Insufficient documentation

## 2014-05-08 DIAGNOSIS — E669 Obesity, unspecified: Secondary | ICD-10-CM | POA: Insufficient documentation

## 2014-05-08 DIAGNOSIS — IMO0002 Reserved for concepts with insufficient information to code with codable children: Secondary | ICD-10-CM

## 2014-05-08 DIAGNOSIS — K589 Irritable bowel syndrome without diarrhea: Secondary | ICD-10-CM | POA: Diagnosis not present

## 2014-05-08 DIAGNOSIS — Z6831 Body mass index (BMI) 31.0-31.9, adult: Secondary | ICD-10-CM | POA: Insufficient documentation

## 2014-05-08 DIAGNOSIS — E1140 Type 2 diabetes mellitus with diabetic neuropathy, unspecified: Secondary | ICD-10-CM | POA: Insufficient documentation

## 2014-05-08 DIAGNOSIS — F32A Depression, unspecified: Secondary | ICD-10-CM

## 2014-05-08 DIAGNOSIS — E118 Type 2 diabetes mellitus with unspecified complications: Secondary | ICD-10-CM | POA: Insufficient documentation

## 2014-05-08 DIAGNOSIS — H9202 Otalgia, left ear: Secondary | ICD-10-CM

## 2014-05-08 DIAGNOSIS — E1065 Type 1 diabetes mellitus with hyperglycemia: Secondary | ICD-10-CM

## 2014-05-08 DIAGNOSIS — S98912A Complete traumatic amputation of left foot, level unspecified, initial encounter: Secondary | ICD-10-CM | POA: Insufficient documentation

## 2014-05-08 DIAGNOSIS — E1165 Type 2 diabetes mellitus with hyperglycemia: Secondary | ICD-10-CM | POA: Insufficient documentation

## 2014-05-08 DIAGNOSIS — F419 Anxiety disorder, unspecified: Secondary | ICD-10-CM | POA: Diagnosis not present

## 2014-05-08 DIAGNOSIS — F329 Major depressive disorder, single episode, unspecified: Secondary | ICD-10-CM | POA: Insufficient documentation

## 2014-05-08 DIAGNOSIS — M797 Fibromyalgia: Secondary | ICD-10-CM | POA: Diagnosis not present

## 2014-05-08 DIAGNOSIS — Z79899 Other long term (current) drug therapy: Secondary | ICD-10-CM | POA: Diagnosis not present

## 2014-05-08 DIAGNOSIS — Z794 Long term (current) use of insulin: Secondary | ICD-10-CM | POA: Insufficient documentation

## 2014-05-08 DIAGNOSIS — F418 Other specified anxiety disorders: Secondary | ICD-10-CM

## 2014-05-08 DIAGNOSIS — E559 Vitamin D deficiency, unspecified: Secondary | ICD-10-CM | POA: Insufficient documentation

## 2014-05-08 DIAGNOSIS — S98912S Complete traumatic amputation of left foot, level unspecified, sequela: Secondary | ICD-10-CM

## 2014-05-08 LAB — GLUCOSE, POCT (MANUAL RESULT ENTRY): POC Glucose: 96 mg/dl (ref 70–99)

## 2014-05-08 LAB — TSH: TSH: 1.628 u[IU]/mL (ref 0.350–4.500)

## 2014-05-08 MED ORDER — TRAMADOL HCL 50 MG PO TABS: 50.0000 mg | ORAL_TABLET | Freq: Three times a day (TID) | ORAL | Status: DC | PRN

## 2014-05-08 MED ORDER — TRAZODONE HCL 50 MG PO TABS: 25.0000 mg | ORAL_TABLET | Freq: Every evening | ORAL | Status: DC | PRN

## 2014-05-08 MED ORDER — LIRAGLUTIDE 18 MG/3ML ~~LOC~~ SOPN: 1.2000 mg | PEN_INJECTOR | Freq: Every day | SUBCUTANEOUS | Status: DC

## 2014-05-08 MED ORDER — ALPRAZOLAM 0.5 MG PO TABS: 0.5000 mg | ORAL_TABLET | Freq: Every evening | ORAL | Status: DC | PRN

## 2014-05-08 MED ORDER — PREGABALIN 100 MG PO CAPS: 100.0000 mg | ORAL_CAPSULE | Freq: Three times a day (TID) | ORAL | Status: DC

## 2014-05-08 MED ORDER — DIPHENOXYLATE-ATROPINE 2.5-0.025 MG PO TABS: 2.0000 | ORAL_TABLET | Freq: Three times a day (TID) | ORAL | Status: DC

## 2014-05-08 NOTE — Patient Instructions (Signed)
Ms. Deese,  Thank you for coming in today. It was a pleasure meeting you. I look forward to being your primary doctor.   1. Anxiety and depression: Continue xanax at night as needed Adding trazodone 25-50 mg nightly Counseling services available at [facility] of [place], [place] and [place].   2. Diabetic neuropathy:  Increase lyrica to 100 mg three times daily Refilled tramadol up to three times daily, use sparingly.   3. IBS: refilled lomotil  4. Trouble losing weight: add weight training and mix up cardio Apply to [facility], they do a sliding scale based on income.   Checking vit D and TSH  F/u in 3 months with me  Dr. Adrian Blackwater

## 2014-05-08 NOTE — Assessment & Plan Note (Addendum)
Diabetic neuropathy:  Increase lyrica to 100 mg three times daily Refilled tramadol up to three times daily, use sparingly.

## 2014-05-08 NOTE — Assessment & Plan Note (Signed)
1. Anxiety and depression: Continue xanax at night as needed Adding trazodone 25-50 mg nightly Counseling services available at [facility] of [place], [place] and [place].

## 2014-05-08 NOTE — Assessment & Plan Note (Signed)
IBS: refilled lomotil

## 2014-05-08 NOTE — Assessment & Plan Note (Signed)
Trouble losing weight: add weight training and mix up cardio Apply to [facility], they do a sliding scale based on income.

## 2014-05-08 NOTE — Assessment & Plan Note (Signed)
A: A1c at goal Meds: compliant P: continue current regimen, patient followed by endocrinology

## 2014-05-08 NOTE — Progress Notes (Signed)
   Subjective:    Patient ID: [name], female    DOB: Jan 04, 1966, 49 y.o.   MRN: [id number] CC: establish care, medication refills, meet new MD,  HPI 1. CHRONIC DIABETES  Disease Monitoring  Blood Sugar Ranges: at goal   Polyuria: no   Visual problems: no   Medication Compliance: yes  Medication Side Effects  Hypoglycemia: yes, this past weekend felt low, had candy bar, up to 77.    Preventitive Health Care  Eye Exam: due, has an ophthalmologist,   Foot Exam: done     2. Diabetic neuropathy: painful with concomitant fibromyalgia.  Patient taking lyrica 100 mg BID. Would like increase or lyrica if possible as it helped. Would also like tramadol refill.  3. Anxiety and depression: most of her life. On cymbalta 30 mg daily. Was on BZ (xaanx and valium) since birth of daughter who is now 76. Her mother passed away last 12-21-2022 and this has been very hard. She has never had counseling services. Her mood symptoms include periods of anxiety, depression, insomnia.  Review of Systems  As per HPI     Objective:   Physical Exam BP 146/82 mmHg  Pulse 78  Temp(Src) 98.6 F (37 C) (Oral)  Resp 18  Ht 5\' 10"  (1.778 m)  Wt 222 lb (100.699 kg)  BMI 31.85 kg/m2  SpO2 97% General appearance: alert, cooperative and no distress Lungs: clear to auscultation bilaterally Heart: regular rate and rhythm, S1, S2 normal, no murmur, click, rub or gallop Extremities: no edema. L foot amputation. R foot normal.      Assessment & Plan:

## 2014-05-08 NOTE — Progress Notes (Signed)
Establish Care Hx DM needing refills

## 2014-05-09 LAB — VITAMIN D 25 HYDROXY (VIT D DEFICIENCY, FRACTURES): Vit D, 25-Hydroxy: 21 ng/mL — ABNORMAL LOW (ref 30–100)

## 2014-05-09 MED ORDER — CALCIUM CITRATE-VITAMIN D 500-400 MG-UNIT PO CHEW: 1.0000 | CHEWABLE_TABLET | Freq: Two times a day (BID) | ORAL | Status: DC

## 2014-05-09 NOTE — Assessment & Plan Note (Signed)
Vit D insufficiency. Recommend supplement with 800 units  of Vit D3 supplement daily. Also be mindful to get 1000 mg of calcium in diet or supplement form daily.  Supplement sent to onsite pharmacy but are also available OTC if patient prefers.

## 2014-05-09 NOTE — Addendum Note (Signed)
Addended by: Boykin Nearing on: 05/09/2014 08:41 AM   Modules accepted: Orders

## 2014-05-16 ENCOUNTER — Telehealth: Payer: Self-pay | Admitting: Family Medicine

## 2014-05-16 NOTE — Telephone Encounter (Signed)
Pt called requesting lamisil, she requested it during her visit but states its not at the pharmacy . Pt also is requesting weight loss medication please f/u with pt

## 2014-05-22 ENCOUNTER — Telehealth: Payer: Self-pay | Admitting: Family Medicine

## 2014-05-22 NOTE — Telephone Encounter (Signed)
Pt called requesting medication lamisil, pt states she spoke to Dr.Funches about medication. Please f/u with pt

## 2014-05-22 NOTE — Telephone Encounter (Signed)
Pt aware of needing appointment with PCP

## 2014-05-22 NOTE — Telephone Encounter (Signed)
I do not have documentation of lamisil or toenail fungus, I may have neglected to document it.  Patient will need f/u appt to discuss. Also CMP to check liver. I did refilled lomotil are we sure she is not asking about lomitil?   No weight loss medications available.

## 2014-05-23 ENCOUNTER — Telehealth: Payer: Self-pay

## 2014-05-23 NOTE — Telephone Encounter (Signed)
Returned patient phone call Call transferred to Dr Adrian Blackwater nurse line Patient had questions for her doctor

## 2014-06-14 ENCOUNTER — Telehealth: Payer: Self-pay | Admitting: *Deleted

## 2014-06-14 MED ORDER — CYCLOBENZAPRINE HCL 10 MG PO TABS: ORAL_TABLET | ORAL | Status: DC

## 2014-06-14 NOTE — Telephone Encounter (Signed)
Patient called in requesting refills on Lomotil, Xanax,Flexeril, and Lyrica.  Told patient she has refills on her Lomotil and that I could not e-prescribe the Lyrica or Xanax and that Dr. Adrian Blackwater was out of the office until Monday of next week.  Told her I would send her  Refill of Flexeril in to pharmacy.

## 2014-06-14 NOTE — Telephone Encounter (Signed)
Patient asking for refill of Xanax and Lyrica.  Please advise

## 2014-06-20 NOTE — Telephone Encounter (Signed)
There were 5 refills on both the lyrica and xanax when last prescribed on 05/07/13. No new Rx should be needed. Please clarify request. Patient should call her pharmacy

## 2014-06-21 NOTE — Telephone Encounter (Signed)
Left voice message informing Pt need to call [facility] for refills.

## 2014-06-27 ENCOUNTER — Encounter: Payer: Self-pay | Admitting: Family Medicine

## 2014-06-27 DIAGNOSIS — E1122 Type 2 diabetes mellitus with diabetic chronic kidney disease: Secondary | ICD-10-CM | POA: Insufficient documentation

## 2014-06-27 DIAGNOSIS — N183 Chronic kidney disease, stage 3 unspecified: Secondary | ICD-10-CM | POA: Insufficient documentation

## 2014-06-29 ENCOUNTER — Other Ambulatory Visit: Payer: Medicaid Other

## 2014-07-03 ENCOUNTER — Telehealth: Payer: Self-pay | Admitting: Endocrinology

## 2014-07-03 ENCOUNTER — Ambulatory Visit: Payer: Medicaid Other | Admitting: Endocrinology

## 2014-07-03 ENCOUNTER — Encounter: Payer: Self-pay | Admitting: *Deleted

## 2014-07-03 NOTE — Telephone Encounter (Signed)
Patient no showed today's appt. Please advise on how to follow up. °A. No follow up necessary. °B. Follow up urgent. Contact patient immediately. °C. Follow up necessary. Contact patient and schedule visit in ___ days. °D. Follow up advised. Contact patient and schedule visit in ____weeks. ° °

## 2014-07-03 NOTE — Telephone Encounter (Signed)
Letter mailed

## 2014-07-12 NOTE — Telephone Encounter (Signed)
LM for pt to call abck to reschedule

## 2014-08-04 ENCOUNTER — Ambulatory Visit: Payer: Medicaid Other | Attending: Family Medicine | Admitting: Family Medicine

## 2014-08-04 ENCOUNTER — Encounter: Payer: Self-pay | Admitting: Family Medicine

## 2014-08-04 VITALS — BP 129/76 | HR 78 | Temp 98.4°F | Resp 18 | Ht 70.0 in | Wt 220.0 lb

## 2014-08-04 DIAGNOSIS — E118 Type 2 diabetes mellitus with unspecified complications: Secondary | ICD-10-CM

## 2014-08-04 DIAGNOSIS — Z89432 Acquired absence of left foot: Secondary | ICD-10-CM | POA: Diagnosis not present

## 2014-08-04 DIAGNOSIS — E1140 Type 2 diabetes mellitus with diabetic neuropathy, unspecified: Secondary | ICD-10-CM

## 2014-08-04 LAB — POCT GLYCOSYLATED HEMOGLOBIN (HGB A1C): Hemoglobin A1C: 8.3

## 2014-08-04 LAB — GLUCOSE, POCT (MANUAL RESULT ENTRY): POC Glucose: 186 mg/dl — AB (ref 70–99)

## 2014-08-04 MED ORDER — PREGABALIN 50 MG PO CAPS: 50.0000 mg | ORAL_CAPSULE | Freq: Three times a day (TID) | ORAL | Status: DC

## 2014-08-04 MED ORDER — DULOXETINE HCL 60 MG PO CPEP: 60.0000 mg | ORAL_CAPSULE | Freq: Every day | ORAL | Status: DC

## 2014-08-04 NOTE — Assessment & Plan Note (Signed)
Diabetic neuropathy with weight gain: Refilled lyrica at 50 mg three times a day Refilled cymbalta at 60 mg daily

## 2014-08-04 NOTE — Patient Instructions (Addendum)
Ms. Furtaw,  Thank you for coming in today  1. Diabetic neuropathy with weight gain: Refilled lyrica at 50 mg three times a day Refilled cymbalta at 60 mg daily   2. Diabetes:  Opthalmology referral placed   3. IUD removal: at f/u appt will do the pap smear at the same time  F/u in 2-4 weeks for IUD removal and pap F/u in 3 months for diabetes   Dr. Adrian Blackwater

## 2014-08-04 NOTE — Assessment & Plan Note (Signed)
Diabetes: declined in control due to weight gain and partially due to medication non compliance P: Continue current regimen as prescribed  Opthalmology referral placed

## 2014-08-04 NOTE — Progress Notes (Signed)
Pt requesting medication changes Stated Lyrica, possible gaining wt due to it

## 2014-08-04 NOTE — Progress Notes (Signed)
   Subjective:    Patient ID: [name], female    DOB: 12-16-1965, 49 y.o.   MRN: [id number] CC: DM2 f/u, diabetic neuropathy  HPI  1. CHRONIC DIABETES  Disease Monitoring  Blood Sugar Ranges: 180-200   Polyuria: no   Visual problems: no   Medication Compliance: yes but takes  Medication Side Effects  Hypoglycemia: no   Preventitive Health Care  Eye Exam: due     2. Diabetic neuropathy: painful in feet and legs. Gaining weight on lyrica 100 mg TID. But lyrica is helping more than gabapentin did. Also taking cymbalta 30 mg once daily.    Soc Hx: current smoker  Review of Systems  Constitutional: Negative.   Endocrine: Negative.       Objective:   Physical Exam BP 129/76 mmHg  Pulse 78  Temp(Src) 98.4 F (36.9 C) (Oral)  Resp 18  Ht 5\' 10"  (1.778 m)  Wt 220 lb (99.791 kg)  BMI 31.57 kg/m2  SpO2 95%  Wt Readings from Last 3 Encounters:  08/04/14 220 lb (99.791 kg)  05/08/14 222 lb (100.699 kg)  04/03/14 216 lb 3.2 oz (98.068 kg)  General appearance: alert, cooperative and no distress  Eyes: PERRLA Extremities: extremities normal, atraumatic, no cyanosis or edema, L foot distal amputation   Lab Results  Component Value Date   HGBA1C 7.1* 03/29/2014   CBG 186  Lab Results  Component Value Date   HGBA1C 8.30 08/04/2014        Assessment & Plan:

## 2014-08-18 ENCOUNTER — Other Ambulatory Visit: Payer: Medicaid Other | Admitting: Family Medicine

## 2014-09-22 ENCOUNTER — Other Ambulatory Visit: Payer: Self-pay | Admitting: Family Medicine

## 2014-09-22 DIAGNOSIS — K589 Irritable bowel syndrome without diarrhea: Secondary | ICD-10-CM

## 2014-09-22 MED ORDER — DIPHENOXYLATE-ATROPINE 2.5-0.025 MG PO TABS: 2.0000 | ORAL_TABLET | Freq: Three times a day (TID) | ORAL | Status: DC

## 2014-09-22 NOTE — Telephone Encounter (Signed)
[facility]ed  [facility] for lomotil. Disp # 180 with 2 refills

## 2014-10-17 ENCOUNTER — Other Ambulatory Visit: Payer: Self-pay | Admitting: Endocrinology

## 2014-10-17 ENCOUNTER — Other Ambulatory Visit: Payer: Self-pay | Admitting: Family Medicine

## 2014-10-23 ENCOUNTER — Telehealth: Payer: Self-pay | Admitting: Endocrinology

## 2014-10-23 MED ORDER — INSULIN GLARGINE 100 UNIT/ML SOLOSTAR PEN: 50.0000 [IU] | PEN_INJECTOR | Freq: Every day | SUBCUTANEOUS | Status: DC

## 2014-10-23 MED ORDER — INSULIN ASPART 100 UNIT/ML FLEXPEN: 8.0000 [IU] | PEN_INJECTOR | Freq: Three times a day (TID) | SUBCUTANEOUS | Status: DC

## 2014-10-23 MED ORDER — LIRAGLUTIDE 18 MG/3ML ~~LOC~~ SOPN: 1.2000 mg | PEN_INJECTOR | Freq: Every day | SUBCUTANEOUS | Status: DC

## 2014-10-23 NOTE — Telephone Encounter (Signed)
erx done

## 2014-10-23 NOTE — Telephone Encounter (Signed)
Please call in all three insulins for her she is completely out and has now made and appt

## 2014-10-31 ENCOUNTER — Other Ambulatory Visit: Payer: Self-pay | Admitting: Family Medicine

## 2014-11-13 ENCOUNTER — Other Ambulatory Visit: Payer: Medicaid Other

## 2014-11-14 ENCOUNTER — Other Ambulatory Visit: Payer: Self-pay | Admitting: *Deleted

## 2014-11-14 DIAGNOSIS — E118 Type 2 diabetes mellitus with unspecified complications: Secondary | ICD-10-CM

## 2014-11-16 ENCOUNTER — Other Ambulatory Visit (INDEPENDENT_AMBULATORY_CARE_PROVIDER_SITE_OTHER): Payer: Medicaid Other

## 2014-11-16 DIAGNOSIS — E118 Type 2 diabetes mellitus with unspecified complications: Secondary | ICD-10-CM | POA: Diagnosis not present

## 2014-11-16 DIAGNOSIS — E1165 Type 2 diabetes mellitus with hyperglycemia: Secondary | ICD-10-CM | POA: Diagnosis not present

## 2014-11-16 DIAGNOSIS — IMO0002 Reserved for concepts with insufficient information to code with codable children: Secondary | ICD-10-CM

## 2014-11-16 DIAGNOSIS — E1140 Type 2 diabetes mellitus with diabetic neuropathy, unspecified: Secondary | ICD-10-CM | POA: Diagnosis not present

## 2014-11-16 LAB — COMPREHENSIVE METABOLIC PANEL
ALT: 21 U/L (ref 0–35)
AST: 31 U/L (ref 0–37)
Albumin: 4.1 g/dL (ref 3.5–5.2)
Alkaline Phosphatase: 79 U/L (ref 39–117)
BUN: 34 mg/dL — ABNORMAL HIGH (ref 6–23)
CO2: 31 mEq/L (ref 19–32)
Calcium: 9.9 mg/dL (ref 8.4–10.5)
Chloride: 104 mEq/L (ref 96–112)
Creatinine, Ser: 1.62 mg/dL — ABNORMAL HIGH (ref 0.40–1.20)
GFR: 36.13 mL/min — ABNORMAL LOW (ref >60.00)
Glucose, Bld: 90 mg/dL (ref 70–99)
Potassium: 4.8 mEq/L (ref 3.5–5.1)
Sodium: 139 mEq/L (ref 135–145)
Total Bilirubin: 0.3 mg/dL (ref 0.2–1.2)
Total Protein: 7.7 g/dL (ref 6.0–8.3)

## 2014-11-16 LAB — MICROALBUMIN / CREATININE URINE RATIO
Creatinine,U: 123.8 mg/dL
Microalb Creat Ratio: 51.4 mg/g — ABNORMAL HIGH (ref 0.0–30.0)
Microalb, Ur: 63.6 mg/dL — ABNORMAL HIGH (ref 0.0–1.9)

## 2014-11-16 LAB — HEMOGLOBIN A1C: Hgb A1c MFr Bld: 6.8 % — ABNORMAL HIGH (ref 4.6–6.5)

## 2014-11-16 LAB — LIPID PANEL
Cholesterol: 153 mg/dL (ref 0–200)
HDL: 33.2 mg/dL — ABNORMAL LOW (ref >39.00)
LDL Cholesterol: 95 mg/dL (ref 0–99)
NonHDL: 119.65
Total CHOL/HDL Ratio: 5
Triglycerides: 123 mg/dL (ref 0.0–149.0)
VLDL: 24.6 mg/dL (ref 0.0–40.0)

## 2014-11-20 ENCOUNTER — Ambulatory Visit (INDEPENDENT_AMBULATORY_CARE_PROVIDER_SITE_OTHER): Payer: Medicaid Other | Admitting: Endocrinology

## 2014-11-20 ENCOUNTER — Encounter: Payer: Self-pay | Admitting: Endocrinology

## 2014-11-20 VITALS — BP 140/82 | HR 84 | Temp 98.7°F | Ht 70.0 in | Wt 214.0 lb

## 2014-11-20 DIAGNOSIS — E118 Type 2 diabetes mellitus with unspecified complications: Secondary | ICD-10-CM

## 2014-11-20 DIAGNOSIS — E785 Hyperlipidemia, unspecified: Secondary | ICD-10-CM

## 2014-11-20 DIAGNOSIS — E1121 Type 2 diabetes mellitus with diabetic nephropathy: Secondary | ICD-10-CM

## 2014-11-20 NOTE — Progress Notes (Signed)
Patient ID: [name], female   DOB: [birth year], 49 y.o.   MRN: [id number]    Reason for Appointment: Followup for Type 2 Diabetes  History of Present Illness:          Diagnosis: Type 2 diabetes mellitus, date of diagnosis: 1992        Past history: She had symptomatic hyperglycemia at onset and was treated with metformin and Amaryl. However because of GI side effects from metformin this was not continued  And she thinks she was continued on Amaryl for several years. Subsequently in 2002 when she was pregnant she was treated with insulin  After her pregnancy insulin was stopped and she was back on oral agents Insulin was started again about 3 years ago because of poor control. She does not think she tried any other medications She has been on various insulin regimens in the last 3 years including NovoLog, Novolin 70/30, NovoLog mix 70/30 and Lantus  In 9/14 because of tendency to weight gain she was given a sample of [place] 5 mcg to try but she could not tolerate this for over 2 weeks because of significant nausea. Has not tried Victoza previously She has had persistently poor control in the past with A1c at least 10% since about 2012 She had been taking NovoLog mix 70/30 since about 06/2012 along with Lantus  Recent history:   INSULIN regimen is described as: NovoLog 6-10 before meals    Lantus 50 hs  She has not been seen in follow-up for several months. She was advised to increase her Victoza which was started in 11/15 but was taking only complaining about nausea on the last visit She now says that she has no nausea with this and appears to have much better blood sugar control Previous A1c was 8.3.  She was also not having a functioning glucose monitor for download previously  Current blood sugar patterns and problems:  Her blood sugars appear to be very consistent throughout the day although she is not checking readings after supper postprandially  She thinks that sometimes  she may feel hypoglycemic overnight and may check her sugars with a different monitor  She has not changed her Lantus despite these low sugars  However she is taking less Novolog now at mealtimes and at times when her sugar is near-normal she will skip the dose; previously taking 12-14 units before each meal  With taking Victoza consistently she has initially  lost weight but this is coming back   Hypoglycemia: As above some readings lower overnight      Oral hypoglycemic drugs the patient is taking are: None      Side effects from medications have been: Metformin caused diarrhea    Glucose monitoring:  2.1 times a day       Glucometer:  Accu-Chek Aviva  Blood Glucose readings from   Mean values apply above for all meters except median for One Touch  PRE-MEAL Fasting  1-3 PM  Dinner  overnight  Overall  Glucose range:  63-155   85-165   70-159   78-186    Mean/median:  111   116   118   109  115      Glycemic control:   Lab Results  Component Value Date   HGBA1C 6.8* 11/16/2014   HGBA1C 8.30 08/04/2014   HGBA1C 7.1* 03/29/2014   Lab Results  Component Value Date   MICROALBUR 63.6* 11/16/2014   LDLCALC 95 11/16/2014   CREATININE 1.62* 11/16/2014  Self-care: The diet that the patient has been following is: tries to limit carbohydrates especially at lunchtime. At breakfast she will have eggs and toast, usually will have a meat protein, cheese or egg at lunchtime usually no carbohydrate, dinner has a mixed meal with some carbohydrate. Usually avoiding high fat meals and fried food. Eating outside infrequently       Meals: 3 meals per day. Dinner 5-6 pm          Exercise:  treadmill now for about 30 minutes, uses this slowly 3/7 Dietician visit: Most recent: At diagnosis.               Compliance with the medical regimen: Fair Complications: Peripheral neuropathy with sensory loss and history of diabetic foot ulcer Retinal exam: Most recent:.2014     Weight history:  Previous range 170-218  Wt Readings from Last 3 Encounters:  11/20/14 214 lb (97.07 kg)  08/04/14 220 lb (99.791 kg)  05/08/14 222 lb (100.699 kg)      Medication List       This list is accurate as of: 11/20/14 11:59 PM.  Always use your most recent med list.               ACCU-CHEK AVIVA PLUS W/DEVICE Kit  CHECK BLOOD SUGARS THREE TIMES A DAY.  DX:250.03     ALPRAZolam 0.5 MG tablet  Commonly known as:  XANAX  Take 1 tablet (0.5 mg total) by mouth at bedtime as needed for anxiety or sleep.     calcium citrate-vitamin D 500-400 MG-UNIT chewable tablet  Chew 1 tablet by mouth 2 (two) times daily.     cyclobenzaprine 10 MG tablet  Commonly known as:  FLEXERIL  TAKE ONE TABLET BY MOUTH THREE TIMES DAILY AS NEEDED FOR MUSCLE SPASM     diphenoxylate-atropine 2.5-0.025 MG per tablet  Commonly known as:  LOMOTIL  Take 2 tablets by mouth 3 (three) times daily before meals. Please take thirty (30) minutes prior to meal.     DULoxetine 60 MG capsule  Commonly known as:  CYMBALTA  Take 1 capsule (60 mg total) by mouth daily.     glucose blood test strip  Commonly known as:  ACCU-CHEK AVIVA PLUS  USE TO CHECK BLOOD SUGAR BEFORE MEALS AND AT BEDTIME dx code E10.65     hydrochlorothiazide 12.5 MG tablet  Commonly known as:  HYDRODIURIL  Take 12.5 mg by mouth daily.     insulin aspart 100 UNIT/ML FlexPen  Commonly known as:  NOVOLOG  Inject 8-10 Units into the skin 3 (three) times daily with meals. As need for CBG > 130 with meals     Insulin Glargine 100 UNIT/ML Solostar Pen  Commonly known as:  LANTUS  Inject 50 Units into the skin daily.     Liraglutide 18 MG/3ML Sopn  Commonly known as:  VICTOZA  Inject 0.2 mLs (1.2 mg total) into the skin daily.     lisinopril 20 MG tablet  Commonly known as:  PRINIVIL,ZESTRIL  Take 1 tablet (20 mg total) by mouth daily.     omeprazole 20 MG capsule  Commonly known as:  PRILOSEC  Take 1 capsule (20 mg total) by mouth daily.      pregabalin 50 MG capsule  Commonly known as:  LYRICA  Take 1 capsule (50 mg total) by mouth 3 (three) times daily.     traMADol 50 MG tablet  Commonly known as:  ULTRAM  Take 1 tablet (50 mg  total) by mouth every 8 (eight) hours as needed.     traZODone 50 MG tablet  Commonly known as:  DESYREL  Take 0.5-1 tablets (25-50 mg total) by mouth at bedtime as needed for sleep.        Allergies:  Allergies  Allergen Reactions  . Latex Rash    Past Medical History  Diagnosis Date  . Chest tightness   . Urinary frequency   . Nausea and vomiting in adult   . Diabetic neuropathy   . Yeast vaginitis   . Diabetes type 1, uncontrolled     at age 79  . GERD (gastroesophageal reflux disease)     about age of 3  . Essential hypertension 2015  . Depression 2001  . Anxiety 2002    Past Surgical History  Procedure Laterality Date  . Endometrial ablation    . Amputation Left 06/15/2012    Procedure: AMPUTATION DIGIT;  Surgeon: Meredith Pel, MD;  Location: [facility];  Service: Orthopedics;  Laterality: Left;  Left great toe revision amputation  . Achilles tendon surgery Left 03/10/2013    Procedure: LEFT CHOPART AMPUTATION/ LEFT TENDON ACHILLES [place];  Surgeon: Wylene Simmer, MD;  Location: [facility];  Service: Orthopedics;  Laterality: Left;    Family History  Problem Relation Age of Onset  . Diabetes Mother   . Heart attack Mother   . Heart disease Mother     before age 9  . Hypertension Mother   . Hyperlipidemia Mother   . Diabetes Father   . Heart attack Father   . Heart disease Father     before age 28  . Diabetes Sister   . Hypertension Sister     Social History:  reports that she has been smoking Cigarettes.  She has a 11 pack-year smoking history. She has never used smokeless tobacco. She reports that she drinks alcohol. She reports that she does not use illicit drugs.    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  She is now taking 60 mg of Cymbalta with some improved relief Also taking  Lyrica     Physical Examination:  BP 140/82 mmHg  Pulse 84  Temp(Src) 98.7 F (37.1 C) (Oral)  Ht _0  (1.778 m)  Wt 214 lb (97.07 kg)  BMI 30.71 kg/m2  SpO2 96%     No ankle edema  ASSESSMENT:  Diabetes type 2 with mild obesity   As discussed in history of present illness she is having somewhat better controlled with adding Victoza to her basal bolus insulin regimen Her blood sugars are also fluctuating less This has also helped her reduce her mealtime coverage However not clear if her blood sugars are high after evening  meal when she does not check her sugar She does not understand needing to reduce Lantus based on overnight blood sugars which may be low at least a couple of times  She can probably lose weight with optimal diet and exercise which she is not able to do Also may benefit from reducing basal insulin  RENAL dysfunction/microalbuminuria: Creatinine is still high.  She does note to avoid nonsteroidal anti-inflammatory drugs  PLAN:   To reduce Lantus by at least 4 units, discussed looking at blood sugar trend in the morning and adjusting Lantus up or down to units weekly  More readings after supper and adjust Novolog based on both  meal size and how her postprandial readings,  More regular exercise  Continue follow-up with nephrologist and PCP for hypertension  Avoid nonsteroidal drugs  Patient Instructions  Lantus 46 units  Check blood sugars on waking up .Marland Kitchen3-4  .Marland Kitchen times a week Also check blood sugars about 2 hours after a meal and do this after different meals by rotation  Recommended blood sugar levels on waking up is 90-130 and about 2 hours after meal is 140-180 Please bring blood sugar monitor to each visit.   Counseling time on subjects discussed above is over 50% of today's 25 minute visit    Khiree Bukhari 11/21/2014, 3:39 PM

## 2014-11-20 NOTE — Patient Instructions (Signed)
Lantus 46 units  Check blood sugars on waking up .Marland Kitchen3-4  .Marland Kitchen times a week Also check blood sugars about 2 hours after a meal and do this after different meals by rotation  Recommended blood sugar levels on waking up is 90-130 and about 2 hours after meal is 140-180 Please bring blood sugar monitor to each visit.

## 2014-11-23 ENCOUNTER — Ambulatory Visit: Payer: Medicaid Other | Admitting: Family Medicine

## 2014-12-29 ENCOUNTER — Other Ambulatory Visit (HOSPITAL_COMMUNITY)
Admission: RE | Admit: 2014-12-29 | Discharge: 2014-12-29 | Disposition: A | Discharge status: Home / Self Care | Payer: Medicaid Other | Source: Ambulatory Visit | Attending: Family Medicine | Admitting: Family Medicine

## 2014-12-29 ENCOUNTER — Encounter: Payer: Self-pay | Admitting: Family Medicine

## 2014-12-29 ENCOUNTER — Ambulatory Visit: Payer: Medicaid Other | Attending: Family Medicine | Admitting: Family Medicine

## 2014-12-29 VITALS — BP 168/77 | HR 75 | Temp 99.1°F | Resp 18 | Ht 70.0 in | Wt 213.6 lb

## 2014-12-29 DIAGNOSIS — Z1151 Encounter for screening for human papillomavirus (HPV): Secondary | ICD-10-CM | POA: Insufficient documentation

## 2014-12-29 DIAGNOSIS — F418 Other specified anxiety disorders: Secondary | ICD-10-CM | POA: Diagnosis present

## 2014-12-29 DIAGNOSIS — Z113 Encounter for screening for infections with a predominantly sexual mode of transmission: Secondary | ICD-10-CM | POA: Diagnosis present

## 2014-12-29 DIAGNOSIS — Z30432 Encounter for removal of intrauterine contraceptive device: Secondary | ICD-10-CM

## 2014-12-29 DIAGNOSIS — Z124 Encounter for screening for malignant neoplasm of cervix: Secondary | ICD-10-CM | POA: Insufficient documentation

## 2014-12-29 DIAGNOSIS — N760 Acute vaginitis: Secondary | ICD-10-CM | POA: Diagnosis present

## 2014-12-29 DIAGNOSIS — R1011 Right upper quadrant pain: Secondary | ICD-10-CM | POA: Diagnosis not present

## 2014-12-29 DIAGNOSIS — K8020 Calculus of gallbladder without cholecystitis without obstruction: Secondary | ICD-10-CM

## 2014-12-29 DIAGNOSIS — Z01419 Encounter for gynecological examination (general) (routine) without abnormal findings: Secondary | ICD-10-CM | POA: Diagnosis present

## 2014-12-29 DIAGNOSIS — F32A Depression, unspecified: Secondary | ICD-10-CM

## 2014-12-29 DIAGNOSIS — L989 Disorder of the skin and subcutaneous tissue, unspecified: Secondary | ICD-10-CM | POA: Insufficient documentation

## 2014-12-29 DIAGNOSIS — F329 Major depressive disorder, single episode, unspecified: Secondary | ICD-10-CM

## 2014-12-29 DIAGNOSIS — F419 Anxiety disorder, unspecified: Secondary | ICD-10-CM

## 2014-12-29 DIAGNOSIS — E118 Type 2 diabetes mellitus with unspecified complications: Secondary | ICD-10-CM | POA: Insufficient documentation

## 2014-12-29 LAB — GLUCOSE, POCT (MANUAL RESULT ENTRY): POC Glucose: 94 mg/dl (ref 70–99)

## 2014-12-29 MED ORDER — ALPRAZOLAM 0.5 MG PO TABS: 0.5000 mg | ORAL_TABLET | Freq: Every evening | ORAL | Status: DC | PRN

## 2014-12-29 MED ORDER — GLUCOSE BLOOD VI STRP: ORAL_STRIP | Status: DC

## 2014-12-29 NOTE — Progress Notes (Signed)
Patient here for IUD removal.  Patient complains of pain in abdomen, scaled at a 9, present for a month, severe over last two weeks. Patient wondering if related to gall bladder.  Patient requesting refill on xanax and test strips

## 2014-12-29 NOTE — Patient Instructions (Signed)
[name] was seen today for contraception.  Diagnoses and all orders for this visit:  Controlled diabetes mellitus type 2 with complications, unspecified long term insulin use status (HCC) -     Glucose (CBG)  Pap smear for cervical cancer screening -     Cytology - PAP [place]  Skin lesion -     Ambulatory referral to Dermatology  RUQ abdominal pain -     US Abdomen Complete; Future   You will be called with results   F/u in 3 months for diabetes  Dr. Adrian Blackwater

## 2014-12-29 NOTE — Progress Notes (Signed)
Patient ID: [name], female   DOB: January 01, 1966, 49 y.o.   MRN: [id number]   Subjective:  Patient ID: [name], female    DOB: 17-Apr-1965  Age: 49 y.o. MRN: [id number]  CC: Contraception   HPI [name] presents for    1. Pap: due for pap. Also would like IUD removal. Hoping to lose weight after IUD removal. No pelvic pain or vaginal discharge.  2. RUQ pain: has chronic diarrhea. Developed RUQ pain x one month. No associated fever or chills. Patient worsened one week ago. Drinking fluids. Having frequent diarrhea.  3. Skin lesion: L elbow patch of dry skin slightly raised x one year. No known trauma. Requesting derm referral.  Social History  Substance Use Topics  . Smoking status: Light Tobacco Smoker -- 0.50 packs/day for 22 years    Types: Cigarettes  . Smokeless tobacco: Never Used  . Alcohol Use: 0.0 oz/week    0 Standard drinks or equivalent per week     Comment: sociial    Outpatient Prescriptions Prior to Visit  Medication Sig Dispense Refill  . Blood Glucose Monitoring Suppl (ACCU-CHEK AVIVA PLUS) W/DEVICE KIT CHECK BLOOD SUGARS THREE TIMES A DAY.  DX:250.03 1 kit 0  . calcium citrate-vitamin D 500-400 MG-UNIT chewable tablet Chew 1 tablet by mouth 2 (two) times daily. 180 tablet 1  . cyclobenzaprine (FLEXERIL) 10 MG tablet TAKE ONE TABLET BY MOUTH THREE TIMES DAILY AS NEEDED FOR MUSCLE SPASM 30 tablet 0  . diphenoxylate-atropine (LOMOTIL) 2.5-0.025 MG per tablet Take 2 tablets by mouth 3 (three) times daily before meals. Please take thirty (30) minutes prior to meal. 180 tablet 2  . DULoxetine (CYMBALTA) 60 MG capsule Take 1 capsule (60 mg total) by mouth daily. 30 capsule 5  . hydrochlorothiazide (HYDRODIURIL) 12.5 MG tablet Take 12.5 mg by mouth daily.    . insulin aspart (NOVOLOG) 100 UNIT/ML FlexPen Inject 8-10 Units into the skin 3 (three) times daily with meals. As need for CBG > 130 with meals 15 mL 3  . Insulin Glargine (LANTUS) 100 UNIT/ML  Solostar Pen Inject 50 Units into the skin daily. 15 mL 3  . Liraglutide (VICTOZA) 18 MG/3ML SOPN Inject 0.2 mLs (1.2 mg total) into the skin daily. 3 pen 3  . lisinopril (PRINIVIL,ZESTRIL) 20 MG tablet Take 1 tablet (20 mg total) by mouth daily. 90 tablet 4  . omeprazole (PRILOSEC) 20 MG capsule Take 1 capsule (20 mg total) by mouth daily. 90 capsule 3  . pregabalin (LYRICA) 50 MG capsule Take 1 capsule (50 mg total) by mouth 3 (three) times daily. 90 capsule 5  . traMADol (ULTRAM) 50 MG tablet Take 1 tablet (50 mg total) by mouth every 8 (eight) hours as needed. 60 tablet 3  . ALPRAZolam (XANAX) 0.5 MG tablet Take 1 tablet (0.5 mg total) by mouth at bedtime as needed for anxiety or sleep. 30 tablet 5  . glucose blood (ACCU-CHEK AVIVA PLUS) test strip USE TO CHECK BLOOD SUGAR BEFORE MEALS AND AT BEDTIME dx code E10.65 125 each 3  . traZODone (DESYREL) 50 MG tablet Take 0.5-1 tablets (25-50 mg total) by mouth at bedtime as needed for sleep. 30 tablet 3   No facility-administered medications prior to visit.    ROS Review of Systems  Constitutional: Negative for fever and chills.  Eyes: Negative for visual disturbance.  Respiratory: Negative for shortness of breath.   Cardiovascular: Negative for chest pain.  Gastrointestinal: Positive for abdominal pain and diarrhea.  Negative for blood in stool.  Musculoskeletal: Negative for back pain and arthralgias.  Skin: Negative for rash.  Allergic/Immunologic: Negative for immunocompromised state.  Hematological: Negative for adenopathy. Does not bruise/bleed easily.  Psychiatric/Behavioral: Negative for suicidal ideas and dysphoric mood.    Objective:  BP 168/77 mmHg  Pulse 75  Temp(Src) 99.1 F (37.3 C) (Oral)  Resp 18  Ht 5' 10" (1.778 m)  Wt 213 lb 9.6 oz (96.888 kg)  BMI 30.65 kg/m2  SpO2 96%  BP/Weight 12/29/2014 9/38/1017 07/01/256  Systolic BP 527 782 423  Diastolic BP 77 82 76  Wt. (Lbs) 213.6 214 220  BMI 30.65 30.71 31.57     Physical Exam  Constitutional: She is oriented to person, place, and time. She appears well-developed and well-nourished. No distress.  HENT:  Head: Normocephalic and atraumatic.  Cardiovascular: Normal rate, regular rhythm, normal heart sounds and intact distal pulses.   Pulmonary/Chest: Effort normal and breath sounds normal.  Abdominal: Soft. There is no tenderness.  Genitourinary: Vagina normal and uterus normal. Cervix exhibits discharge. Cervix exhibits no motion tenderness and no friability. Right adnexum displays no mass, no tenderness and no fullness. Left adnexum displays no mass, no tenderness and no fullness. No vaginal discharge found.  IUD strings visualized and removed    Musculoskeletal: She exhibits no edema.  Neurological: She is alert and oriented to person, place, and time.  Skin: Skin is warm and dry. No rash noted.     Psychiatric: She has a normal mood and affect.    Assessment & Plan:   Problem List Items Addressed This Visit    Anxiety and depression (Chronic)   Relevant Medications   ALPRAZolam (XANAX) 0.5 MG tablet   Diabetes mellitus type 2, controlled, with complications (HCC) - Primary (Chronic)   Relevant Medications   glucose blood (ACCU-CHEK AVIVA PLUS) test strip   Other Relevant Orders   Glucose (CBG) (Completed)    Other Visit Diagnoses    Pap smear for cervical cancer screening        Relevant Orders    Cytology - PAP [place]    Skin lesion        Relevant Orders    Ambulatory referral to Dermatology    RUQ abdominal pain        Relevant Orders    US Abdomen Complete       Meds ordered this encounter  Medications  . ALPRAZolam (XANAX) 0.5 MG tablet    Sig: Take 1 tablet (0.5 mg total) by mouth at bedtime as needed for anxiety or sleep.    Dispense:  30 tablet    Refill:  5  . glucose blood (ACCU-CHEK AVIVA PLUS) test strip    Sig: USE TO CHECK BLOOD SUGAR BEFORE MEALS AND AT BEDTIME dx code E10.65    Dispense:  125 each     Refill:  3    Follow-up: No Follow-up on file.   Boykin Nearing MD

## 2015-01-01 LAB — CERVICOVAGINAL ANCILLARY ONLY
Chlamydia: NEGATIVE
Neisseria Gonorrhea: NEGATIVE
Wet Prep (BD Affirm): NEGATIVE

## 2015-01-03 LAB — CYTOLOGY - PAP

## 2015-01-05 ENCOUNTER — Ambulatory Visit (HOSPITAL_COMMUNITY)
Admission: RE | Admit: 2015-01-05 | Discharge: 2015-01-05 | Disposition: A | Discharge status: Home / Self Care | Payer: Medicaid Other | Source: Ambulatory Visit | Attending: Family Medicine | Admitting: Family Medicine

## 2015-01-05 ENCOUNTER — Telehealth: Payer: Self-pay | Admitting: Family Medicine

## 2015-01-05 DIAGNOSIS — R112 Nausea with vomiting, unspecified: Secondary | ICD-10-CM | POA: Diagnosis not present

## 2015-01-05 DIAGNOSIS — K8020 Calculus of gallbladder without cholecystitis without obstruction: Secondary | ICD-10-CM | POA: Insufficient documentation

## 2015-01-05 DIAGNOSIS — R1010 Upper abdominal pain, unspecified: Secondary | ICD-10-CM | POA: Insufficient documentation

## 2015-01-05 DIAGNOSIS — R1011 Right upper quadrant pain: Secondary | ICD-10-CM

## 2015-01-05 NOTE — Telephone Encounter (Signed)
Pt. Would like to know her results from the ultrasound as soon as possible....Marland Kitchenultrasound was performed on 01/05/15

## 2015-01-05 NOTE — Addendum Note (Signed)
Addended by: Boykin Nearing on: 01/05/2015 01:45 PM   Modules accepted: Orders

## 2015-01-05 NOTE — Assessment & Plan Note (Signed)
Gallstones on [place] Most likely source of pain Recommend low fat diet General surgery referral placed

## 2015-01-10 ENCOUNTER — Telehealth: Payer: Self-pay | Admitting: *Deleted

## 2015-01-10 NOTE — Telephone Encounter (Signed)
Date of birth verified by pt  [place] results given to pt Gallstones on [place] Most likely source of pain Recommend low fat diet General surgery referral placed

## 2015-01-10 NOTE — Telephone Encounter (Signed)
-----   Message from Boykin Nearing, MD sent at 01/05/2015  1:00 PM EDT ----- Negative pap Repeat in 3-5 years

## 2015-01-10 NOTE — Telephone Encounter (Signed)
-----   Message from Boykin Nearing, MD sent at 01/05/2015  1:43 PM EDT ----- Gallstones on [place] Most likely source of pain Recommend low fat diet General surgery referral placed

## 2015-01-10 NOTE — Telephone Encounter (Signed)
-----   Message from Boykin Nearing, MD sent at 01/01/2015  5:53 PM EDT ----- Negative GC/chlam

## 2015-01-10 NOTE — Telephone Encounter (Signed)
-----   Message from Boykin Nearing, MD sent at 01/01/2015  9:02 AM EDT ----- Negative wet prep

## 2015-01-10 NOTE — Telephone Encounter (Signed)
Date of birth verified by pt Negative pap wep prep and GC results given Next Pap smear can be repeat in 3-5 years Pt verbalized understanding

## 2015-01-12 ENCOUNTER — Other Ambulatory Visit: Payer: Self-pay | Admitting: Surgery

## 2015-01-17 ENCOUNTER — Telehealth: Payer: Self-pay | Admitting: Family Medicine

## 2015-01-17 DIAGNOSIS — E1140 Type 2 diabetes mellitus with diabetic neuropathy, unspecified: Secondary | ICD-10-CM

## 2015-01-17 MED ORDER — TRAMADOL HCL 50 MG PO TABS: 50.0000 mg | ORAL_TABLET | Freq: Three times a day (TID) | ORAL | Status: DC | PRN

## 2015-01-17 NOTE — Telephone Encounter (Signed)
Tramadol refilled. Please inform patient Rx is available for pick up

## 2015-01-17 NOTE — Telephone Encounter (Signed)
LVM Rx at front office ready to be pickup If any question return call

## 2015-01-30 ENCOUNTER — Other Ambulatory Visit: Payer: Self-pay | Admitting: Surgery

## 2015-01-30 HISTORY — PX: LAPAROSCOPIC CHOLECYSTECTOMY: SUR755

## 2015-02-13 ENCOUNTER — Other Ambulatory Visit: Payer: Self-pay | Admitting: *Deleted

## 2015-02-13 MED ORDER — LIRAGLUTIDE 18 MG/3ML ~~LOC~~ SOPN: 1.2000 mg | PEN_INJECTOR | Freq: Every day | SUBCUTANEOUS | Status: DC

## 2015-02-14 ENCOUNTER — Other Ambulatory Visit: Payer: Medicaid Other

## 2015-02-19 ENCOUNTER — Ambulatory Visit: Payer: Medicaid Other | Admitting: Endocrinology

## 2015-02-21 ENCOUNTER — Telehealth: Payer: Self-pay | Admitting: Family Medicine

## 2015-02-21 DIAGNOSIS — K588 Other irritable bowel syndrome: Secondary | ICD-10-CM

## 2015-02-21 DIAGNOSIS — E1140 Type 2 diabetes mellitus with diabetic neuropathy, unspecified: Secondary | ICD-10-CM

## 2015-02-21 NOTE — Telephone Encounter (Signed)
traMADol (ULTRAM) 50 MG tablet pregabalin (LYRICA) 50 MG capsule diphenoxylate-atropine (LOMOTIL) 2.5-0.025 MG per tablet

## 2015-02-22 MED ORDER — PREGABALIN 50 MG PO CAPS: 50.0000 mg | ORAL_CAPSULE | Freq: Three times a day (TID) | ORAL | Status: DC

## 2015-02-22 MED ORDER — DIPHENOXYLATE-ATROPINE 2.5-0.025 MG PO TABS: 2.0000 | ORAL_TABLET | Freq: Three times a day (TID) | ORAL | Status: DC

## 2015-02-22 NOTE — Telephone Encounter (Signed)
Tramadol still with refills lyrica and lomotil refilled

## 2015-02-27 NOTE — Telephone Encounter (Signed)
LVM to return call   Rx Lomotil and Lyrica at front office  Tramadol still has refills

## 2015-03-13 ENCOUNTER — Telehealth: Payer: Self-pay | Admitting: Family Medicine

## 2015-03-13 NOTE — Telephone Encounter (Signed)
Pt. Is experiencing diarrhea and gas...recently had gallbladder surgery and does not know if that is related to her symptoms...patient would like an antibiotic called in...please follow up with patient

## 2015-03-14 ENCOUNTER — Other Ambulatory Visit: Payer: Self-pay | Admitting: Endocrinology

## 2015-03-14 NOTE — Telephone Encounter (Signed)
Pt called complaining of NVD and Gas  Schedule F/U apt with PCP  Advised if Sx continue go to [facility]

## 2015-03-15 ENCOUNTER — Ambulatory Visit: Payer: Medicaid Other | Attending: Family Medicine | Admitting: Family Medicine

## 2015-03-15 ENCOUNTER — Encounter: Payer: Self-pay | Admitting: Family Medicine

## 2015-03-15 VITALS — BP 165/82 | HR 67 | Temp 98.7°F | Resp 16 | Ht 70.0 in | Wt 212.0 lb

## 2015-03-15 DIAGNOSIS — E118 Type 2 diabetes mellitus with unspecified complications: Secondary | ICD-10-CM

## 2015-03-15 DIAGNOSIS — Z794 Long term (current) use of insulin: Secondary | ICD-10-CM | POA: Diagnosis not present

## 2015-03-15 DIAGNOSIS — R197 Diarrhea, unspecified: Secondary | ICD-10-CM | POA: Diagnosis present

## 2015-03-15 DIAGNOSIS — Z9889 Other specified postprocedural states: Secondary | ICD-10-CM | POA: Insufficient documentation

## 2015-03-15 DIAGNOSIS — R112 Nausea with vomiting, unspecified: Secondary | ICD-10-CM | POA: Insufficient documentation

## 2015-03-15 DIAGNOSIS — Z79899 Other long term (current) drug therapy: Secondary | ICD-10-CM | POA: Diagnosis not present

## 2015-03-15 DIAGNOSIS — Z6830 Body mass index (BMI) 30.0-30.9, adult: Secondary | ICD-10-CM | POA: Diagnosis not present

## 2015-03-15 DIAGNOSIS — F17210 Nicotine dependence, cigarettes, uncomplicated: Secondary | ICD-10-CM | POA: Diagnosis not present

## 2015-03-15 DIAGNOSIS — Z9049 Acquired absence of other specified parts of digestive tract: Secondary | ICD-10-CM | POA: Insufficient documentation

## 2015-03-15 DIAGNOSIS — E1140 Type 2 diabetes mellitus with diabetic neuropathy, unspecified: Secondary | ICD-10-CM | POA: Diagnosis present

## 2015-03-15 DIAGNOSIS — Z89412 Acquired absence of left great toe: Secondary | ICD-10-CM | POA: Insufficient documentation

## 2015-03-15 LAB — POCT GLYCOSYLATED HEMOGLOBIN (HGB A1C): Hemoglobin A1C: 8.5

## 2015-03-15 LAB — COMPLETE METABOLIC PANEL WITH GFR
ALT: 24 U/L (ref 6–29)
AST: 14 U/L (ref 10–35)
Albumin: 3.9 g/dL (ref 3.6–5.1)
Alkaline Phosphatase: 110 U/L (ref 33–115)
BUN: 29 mg/dL — ABNORMAL HIGH (ref 7–25)
CO2: 24 mmol/L (ref 20–31)
Calcium: 9.8 mg/dL (ref 8.6–10.2)
Chloride: 102 mmol/L (ref 98–110)
Creat: 1.46 mg/dL — ABNORMAL HIGH (ref 0.50–1.10)
GFR, Est African American: 49 mL/min — ABNORMAL LOW (ref >=60)
GFR, Est Non African American: 43 mL/min — ABNORMAL LOW (ref >=60)
Glucose, Bld: 307 mg/dL — ABNORMAL HIGH (ref 65–99)
Potassium: 5.7 mmol/L — ABNORMAL HIGH (ref 3.5–5.3)
Sodium: 138 mmol/L (ref 135–146)
Total Bilirubin: 0.4 mg/dL (ref 0.2–1.2)
Total Protein: 7.4 g/dL (ref 6.1–8.1)

## 2015-03-15 LAB — CBC
HCT: 44.8 % (ref 36.0–46.0)
Hemoglobin: 14.6 g/dL (ref 12.0–15.0)
MCH: 28.3 pg (ref 26.0–34.0)
MCHC: 32.6 g/dL (ref 30.0–36.0)
MCV: 87 fL (ref 78.0–100.0)
MPV: 11.7 fL (ref 8.6–12.4)
Platelets: 293 10*3/uL (ref 150–400)
RBC: 5.15 MIL/uL — ABNORMAL HIGH (ref 3.87–5.11)
RDW: 13.8 % (ref 11.5–15.5)
WBC: 11.2 10*3/uL — ABNORMAL HIGH (ref 4.0–10.5)

## 2015-03-15 LAB — LIPASE: Lipase: 124 U/L — ABNORMAL HIGH (ref 7–60)

## 2015-03-15 LAB — GLUCOSE, POCT (MANUAL RESULT ENTRY): POC Glucose: 318 mg/dl — AB (ref 70–99)

## 2015-03-15 LAB — HEMOCCULT GUIAC POC 1CARD (OFFICE): Fecal Occult Blood, POC: NEGATIVE

## 2015-03-15 MED ORDER — PROMETHAZINE HCL 25 MG PO TABS: 25.0000 mg | ORAL_TABLET | Freq: Three times a day (TID) | ORAL | Status: DC | PRN

## 2015-03-15 NOTE — Patient Instructions (Signed)
[name] was seen today for nausea, emesis and diarrhea.  Diagnoses and all orders for this visit:  Nausea vomiting and diarrhea -     COMPLETE METABOLIC PANEL WITH GFR -     CBC -     Lipase -     promethazine (PHENERGAN) 25 MG tablet; Take 1 tablet (25 mg total) by mouth every 8 (eight) hours as needed for nausea or vomiting. -     Ambulatory referral to Gastroenterology  Controlled type 2 diabetes mellitus with complication, without long-term current use of insulin (HCC) -     POCT glycosylated hemoglobin (Hb A1C) -     POCT glucose (manual entry)   Recommend dietary changes for possible IBS with diarrhea:  1. Exclude foods that increase flatulence (eg, beans, onions, celery, carrots, raisins, bananas, apricots, prunes, Brussels sprouts, wheat germ, pretzels, and bagels), alcohol, and caffeine.  Have symptoms improved?   If yes, continue to excluded above  If no,  2. Exclude lactose-   Have symptoms improved?  If yes, continue to exclude 1 and 2.  If no,  3. Exclude FODMAP Characteristics and sources of common FODMAPs  F Fermentable    O Oligosaccharides Fructans, galacto-oligosaccharides Wheat, barley, rye, onion, leek, white part of spring onion, garlic, shallots, artichokes, beetroot, fennel, peas, chicory, pistachio, cashews, legumes, lentils, and chickpeas  D Disaccharides Lactose Milk, custard, ice cream, and yogurt  M Monosaccharides "Free fructose" (fructose in excess of glucose) Apples, pears, mangoes, cherries, watermelon, asparagus, sugar snap peas, honey, high-fructose corn syrup  A And  P Polyols Sorbitol, mannitol, maltitol, and xylitol Apples, pears, apricots, cherries, nectarines, peaches, plums, watermelon, mushrooms, cauliflower, artificially sweetened chewing gum and confectionery   Be sure to drink plenty of fluids to keep up with water loses from diarrhea  F/u in 4 weeks for N/V/D  Dr. Adrian Blackwater

## 2015-03-15 NOTE — Progress Notes (Signed)
NVD and gas since surgery  Pain scale #8 Tobacco user 1/2 ppday  No suicidal thought in the past two weeks

## 2015-03-15 NOTE — Assessment & Plan Note (Signed)
A: persistent nausea, vomiting, diarrhea? IBS-D, no s/s of acute gastroenterocolitis or IBD P: CBC, CMP, lipase  Gi referral Dietary changes: eliminate gas producing food, lactose, FODMAP Phenergan prn nausea Advised patient rescheduled surgery post-op f/u

## 2015-03-15 NOTE — Progress Notes (Signed)
Patient ID: [name], female   DOB: Nov 05, 1965, 50 y.o.   MRN: [id number]   Subjective:  Patient ID: [name], female    DOB: 06/09/1965  Age: 50 y.o. MRN: [id number]  CC: Nausea; Emesis; and Diarrhea   HPI ALYHA MARINES presents for    1. Nausea, vomiting and diarrhea: she reports persistent N/V/D. This has been a chronic problem. She has hx of IBS. She is not followed by GI. She had gallbladder removal 6 weeks ago. She did not go to post-op follow up appointment. She was hoping that gallbladder removal would help with symptoms and it did not. The most distressing symptom is the diarrhea. Diarrhea is associated with increases gas, with "egg tasting belching". Stool is watery. Occurs after every meals and sometimes between meals. imodium and lomotil have not helped much. Stools turns dark after she takes pepto bismol. There has been no documented fever, chills, syncope, weakness.   2. Diabetes: she has diabetes. She endorses med compliance.  She is followed by Endocrinology, Dr. Dwyane Dee. She is due for diabetes f/u so A1c and CBG obtained. This visit was a for problem #1.   Social History  Substance Use Topics  . Smoking status: Light Tobacco Smoker -- 0.50 packs/day for 22 years    Types: Cigarettes  . Smokeless tobacco: Never Used  . Alcohol Use: 0.0 oz/week    0 Standard drinks or equivalent per week     Comment: sociial   Past Surgical History  Procedure Laterality Date  . Endometrial ablation    . Amputation Left 06/15/2012    Procedure: AMPUTATION DIGIT;  Surgeon: Meredith Pel, MD;  Location: [place];  Service: Orthopedics;  Laterality: Left;  Left great toe revision amputation  . Achilles tendon surgery Left 03/10/2013    Procedure: LEFT CHOPART AMPUTATION/ LEFT TENDON ACHILLES [place];  Surgeon: Wylene Simmer, MD;  Location: [place];  Service: Orthopedics;  Laterality: Left;  . Laparoscopic cholecystectomy  01/30/2015    [facility] surgery     Outpatient  Prescriptions Prior to Visit  Medication Sig Dispense Refill  . ALPRAZolam (XANAX) 0.5 MG tablet Take 1 tablet (0.5 mg total) by mouth at bedtime as needed for anxiety or sleep. 30 tablet 5  . Blood Glucose Monitoring Suppl (ACCU-CHEK AVIVA PLUS) W/DEVICE KIT CHECK BLOOD SUGARS THREE TIMES A DAY.  DX:250.03 1 kit 0  . calcium citrate-vitamin D 500-400 MG-UNIT chewable tablet Chew 1 tablet by mouth 2 (two) times daily. 180 tablet 1  . cyclobenzaprine (FLEXERIL) 10 MG tablet TAKE ONE TABLET BY MOUTH THREE TIMES DAILY AS NEEDED FOR MUSCLE SPASM 30 tablet 0  . diphenoxylate-atropine (LOMOTIL) 2.5-0.025 MG tablet Take 2 tablets by mouth 3 (three) times daily before meals. Please take thirty (30) minutes prior to meal. 180 tablet 2  . DULoxetine (CYMBALTA) 60 MG capsule Take 1 capsule (60 mg total) by mouth daily. 30 capsule 5  . glucose blood (ACCU-CHEK AVIVA PLUS) test strip USE TO CHECK BLOOD SUGAR BEFORE MEALS AND AT BEDTIME dx code E10.65 125 each 3  . hydrochlorothiazide (HYDRODIURIL) 12.5 MG tablet Take 12.5 mg by mouth daily.    . insulin aspart (NOVOLOG) 100 UNIT/ML FlexPen Inject 8-10 Units into the skin 3 (three) times daily with meals. As need for CBG > 130 with meals 15 mL 3  . LANTUS SOLOSTAR 100 UNIT/ML Solostar Pen INJECT 50 UNITS INTO THE SKIN DAILY 15 mL 0  . Liraglutide (VICTOZA) 18 MG/3ML SOPN Inject 0.2  mLs (1.2 mg total) into the skin daily. 6 mL 0  . lisinopril (PRINIVIL,ZESTRIL) 20 MG tablet Take 1 tablet (20 mg total) by mouth daily. 90 tablet 4  . omeprazole (PRILOSEC) 20 MG capsule Take 1 capsule (20 mg total) by mouth daily. 90 capsule 3  . pregabalin (LYRICA) 50 MG capsule Take 1 capsule (50 mg total) by mouth 3 (three) times daily. 90 capsule 5  . traMADol (ULTRAM) 50 MG tablet Take 1 tablet (50 mg total) by mouth every 8 (eight) hours as needed. 60 tablet 2  . traZODone (DESYREL) 50 MG tablet Take 0.5-1 tablets (25-50 mg total) by mouth at bedtime as needed for sleep. 30  tablet 3   No facility-administered medications prior to visit.    ROS Review of Systems  Constitutional: Negative for fever and chills.  Eyes: Negative for visual disturbance.  Respiratory: Negative for shortness of breath.   Cardiovascular: Negative for chest pain.  Gastrointestinal: Positive for nausea, vomiting and diarrhea. Negative for abdominal pain, constipation, blood in stool, abdominal distention, anal bleeding and rectal pain.  Musculoskeletal: Negative for back pain and arthralgias.  Skin: Negative for rash.  Allergic/Immunologic: Negative for immunocompromised state.  Hematological: Negative for adenopathy. Does not bruise/bleed easily.  Psychiatric/Behavioral: Negative for suicidal ideas and dysphoric mood.    Objective:  BP 165/82 mmHg  Pulse 67  Temp(Src) 98.7 F (37.1 C) (Oral)  Resp 16  Ht '5\' 10"'  (1.778 m)  Wt 212 lb (96.163 kg)  BMI 30.42 kg/m2  SpO2 97%  BP/Weight 03/15/2015 12/29/2014 5/69/7948  Systolic BP 016 553 748  Diastolic BP 82 77 82  Wt. (Lbs) 212 213.6 214  BMI 30.42 30.65 30.71    Physical Exam  Constitutional: She is oriented to person, place, and time. She appears well-developed and well-nourished. No distress.  HENT:  Head: Normocephalic and atraumatic.  Eyes: No scleral icterus.  Cardiovascular: Normal rate, regular rhythm, normal heart sounds and intact distal pulses.   Pulmonary/Chest: Effort normal and breath sounds normal.  Abdominal: Soft. Bowel sounds are normal. She exhibits no distension and no mass. There is tenderness. There is no rebound and no guarding.  Healed lap chole incisions   Genitourinary: Rectum normal. Rectal exam shows no mass, no tenderness and anal tone normal. Guaiac negative stool.  Musculoskeletal: She exhibits no edema.  Neurological: She is alert and oriented to person, place, and time.  Skin: Skin is warm and dry. No rash noted.  Psychiatric: She has a normal mood and affect.   Lab Results    Component Value Date   HGBA1C 6.8* 11/16/2014   Lab Results  Component Value Date   HGBA1C 8.50 03/15/2015   CBG 318 Assessment & Plan:   Problem List Items Addressed This Visit    Diabetic neuropathy, painful ([place]) - Primary (Chronic)   Relevant Orders   POCT glycosylated hemoglobin (Hb A1C)   POCT glucose (manual entry)      No orders of the defined types were placed in this encounter.    Follow-up: No Follow-up on file.   Boykin Nearing MD

## 2015-03-16 ENCOUNTER — Telehealth: Payer: Self-pay | Admitting: Family Medicine

## 2015-03-16 DIAGNOSIS — I10 Essential (primary) hypertension: Secondary | ICD-10-CM

## 2015-03-16 DIAGNOSIS — R748 Abnormal levels of other serum enzymes: Secondary | ICD-10-CM

## 2015-03-16 NOTE — Telephone Encounter (Signed)
Results called to patient  Patient with elevated lipase, sightly potassium and hyperglycemia on blood work Potassium elevated suspected to be lab error, given patient report of diarrhea and no hx of elevated K+ and improved renal function.  WBC remains slightly elevated as well, but improved from previous reading No reported alcohol. She had elevated TGs but this was normal at last check.  meds reviewed: omeprazole and HCTZ on med list   Plan: d.c omeprazole if patient taking regularly,  Also increased lisinopril from 20 to 40 mg daily.

## 2015-03-16 NOTE — Telephone Encounter (Signed)
Called patient  Left VM Requested call back to discuss labs

## 2015-03-19 ENCOUNTER — Telehealth: Payer: Self-pay | Admitting: Family Medicine

## 2015-03-19 DIAGNOSIS — R748 Abnormal levels of other serum enzymes: Secondary | ICD-10-CM | POA: Insufficient documentation

## 2015-03-19 MED ORDER — LISINOPRIL 40 MG PO TABS: 40.0000 mg | ORAL_TABLET | Freq: Every day | ORAL | Status: DC

## 2015-03-19 NOTE — Telephone Encounter (Signed)
Patient called back wanting to speak directly with her PCP in reference to call she received on Friday... Patient is upset and states she never gets a returned call. Please follow up with patient

## 2015-03-19 NOTE — Telephone Encounter (Signed)
spoke with patient  Gave lab results  Gave plan D.c HCTZ She was not taking omeprazole, removed from med list  F./u lipase in 2 weeks Increase lisinopril to 400 mg daily

## 2015-03-20 ENCOUNTER — Telehealth: Payer: Self-pay | Admitting: Endocrinology

## 2015-03-20 ENCOUNTER — Other Ambulatory Visit: Payer: Self-pay | Admitting: *Deleted

## 2015-03-20 MED ORDER — LIRAGLUTIDE 18 MG/3ML ~~LOC~~ SOPN: 1.2000 mg | PEN_INJECTOR | Freq: Every day | SUBCUTANEOUS | Status: DC

## 2015-03-20 NOTE — Telephone Encounter (Signed)
Need refill of Victoza sent to [facility] on [address]

## 2015-03-20 NOTE — Telephone Encounter (Signed)
rx sent, patient is due for an appointment this month.

## 2015-03-22 NOTE — Telephone Encounter (Signed)
Done see preview note  

## 2015-03-26 ENCOUNTER — Other Ambulatory Visit: Payer: Self-pay | Admitting: Family Medicine

## 2015-03-26 ENCOUNTER — Telehealth: Payer: Self-pay | Admitting: Family Medicine

## 2015-03-26 DIAGNOSIS — E1140 Type 2 diabetes mellitus with diabetic neuropathy, unspecified: Secondary | ICD-10-CM

## 2015-03-26 MED ORDER — PREGABALIN 50 MG PO CAPS: 50.0000 mg | ORAL_CAPSULE | Freq: Three times a day (TID) | ORAL | Status: DC

## 2015-03-26 NOTE — Telephone Encounter (Signed)
Pt. Called requesting to speak to the nurse stating that she has been vomiting, having dizzie spells and passing out. Please f/u with pt.

## 2015-03-27 NOTE — Telephone Encounter (Signed)
LVM to return call.

## 2015-03-28 ENCOUNTER — Telehealth: Payer: Self-pay | Admitting: Family Medicine

## 2015-03-28 NOTE — Telephone Encounter (Signed)
Sheriff called back stated Pt doing well and is at work at this time

## 2015-03-28 NOTE — Telephone Encounter (Signed)
Call Pt x2 day, LVM to return call. Since pt passing out and no return call Call emergency number Hessie Knows at [phone].  Mr Bennie Pierini stated has not talk to Pt in two weeks. Will try to call make sure she is ok. Mr Bennie Pierini stated pt lives with Daughter.  Call 911 to check on Pt make sure pt is ok.  911 will send the [facility].

## 2015-03-28 NOTE — Telephone Encounter (Signed)
Patient called wanting to speak with nurse.Marland Kitchen Please follow up with patient.

## 2015-04-03 ENCOUNTER — Other Ambulatory Visit: Payer: Self-pay | Admitting: *Deleted

## 2015-04-03 ENCOUNTER — Telehealth: Payer: Self-pay | Admitting: *Deleted

## 2015-04-03 DIAGNOSIS — E1140 Type 2 diabetes mellitus with diabetic neuropathy, unspecified: Secondary | ICD-10-CM

## 2015-04-03 MED ORDER — DULOXETINE HCL 60 MG PO CPEP: 60.0000 mg | ORAL_CAPSULE | Freq: Every day | ORAL | Status: DC

## 2015-04-03 NOTE — Telephone Encounter (Signed)
[facility] requesting Rx refill Cymbalta

## 2015-04-16 ENCOUNTER — Telehealth: Payer: Self-pay | Admitting: Family Medicine

## 2015-04-16 DIAGNOSIS — E1140 Type 2 diabetes mellitus with diabetic neuropathy, unspecified: Secondary | ICD-10-CM

## 2015-04-16 NOTE — Telephone Encounter (Signed)
Patient is requesting a refill on Lyrica and would like for it to be sent to the [facility] on [address].

## 2015-04-17 MED ORDER — GABAPENTIN 100 MG PO CAPS: 100.0000 mg | ORAL_CAPSULE | Freq: Three times a day (TID) | ORAL | Status: DC

## 2015-04-17 NOTE — Telephone Encounter (Signed)
Please inform patient that PA for lyrica is under review.  In meantime, gabapentin has been sent to her pharmacy.    Called [place] [facility] for PA of lyrica Reference # [id number] Ticket # [id number]  Was asked if she had tried and failed 2 preferred medications: She has tried and failed gabapentin only She has not tried: Topamax or clonazepam.  PA has been sent to clinical review,  I provided additional clinical information that this is a continuation of medication since 2015 and patient is currently on xanax. Confirmation # [id number]  Asked to follow up after 24 hrs on [place] tracks portal or via phone for update.

## 2015-04-18 NOTE — Telephone Encounter (Signed)
LVM to return call.

## 2015-04-19 NOTE — Telephone Encounter (Signed)
Please call [place] [facility], see info below to follow up prior authorization decision

## 2015-05-01 ENCOUNTER — Encounter: Payer: Self-pay | Admitting: Clinical

## 2015-05-01 NOTE — Progress Notes (Signed)
Depression screen [facility] 2/9 03/15/2015 02/14/2014 01/24/2014 11/03/2013 06/23/2013  Decreased Interest 3 1 3  0 2  Down, Depressed, Hopeless 3 3 3  0 2  PHQ - 2 Score 6 4 6  0 4  Altered sleeping 3 - 3 - -  Tired, decreased energy 3 - 2 - -  Change in appetite 3 - 0 - -  Feeling bad or failure about yourself  1 - 0 - -  Trouble concentrating 2 - 1 - -  Moving slowly or fidgety/restless 1 - 0 - -  Suicidal thoughts 0 - 0 - -  PHQ-9 Score 19 - 12 - -    GAD 7 : Generalized Anxiety Score 03/15/2015  Nervous, Anxious, on Edge 3  Control/stop worrying 3  Worry too much - different things 3  Trouble relaxing 3  Restless 3  Easily annoyed or irritable 2  Afraid - awful might happen 2  Total GAD 7 Score 19

## 2015-05-14 ENCOUNTER — Other Ambulatory Visit: Payer: Self-pay | Admitting: Endocrinology

## 2015-05-15 ENCOUNTER — Other Ambulatory Visit: Payer: Self-pay | Admitting: Family Medicine

## 2015-05-15 DIAGNOSIS — E1140 Type 2 diabetes mellitus with diabetic neuropathy, unspecified: Secondary | ICD-10-CM

## 2015-05-15 MED ORDER — CYCLOBENZAPRINE HCL 10 MG PO TABS: 10.0000 mg | ORAL_TABLET | Freq: Three times a day (TID) | ORAL | Status: DC | PRN

## 2015-06-08 ENCOUNTER — Other Ambulatory Visit: Payer: Self-pay | Admitting: Endocrinology

## 2015-06-14 ENCOUNTER — Other Ambulatory Visit (INDEPENDENT_AMBULATORY_CARE_PROVIDER_SITE_OTHER): Payer: Medicaid Other

## 2015-06-14 DIAGNOSIS — E118 Type 2 diabetes mellitus with unspecified complications: Secondary | ICD-10-CM | POA: Diagnosis not present

## 2015-06-14 LAB — COMPREHENSIVE METABOLIC PANEL
ALT: 13 U/L (ref 0–35)
AST: 14 U/L (ref 0–37)
Albumin: 3.7 g/dL (ref 3.5–5.2)
Alkaline Phosphatase: 79 U/L (ref 39–117)
BUN: 28 mg/dL — ABNORMAL HIGH (ref 6–23)
CO2: 27 mEq/L (ref 19–32)
Calcium: 9.3 mg/dL (ref 8.4–10.5)
Chloride: 105 mEq/L (ref 96–112)
Creatinine, Ser: 1.49 mg/dL — ABNORMAL HIGH (ref 0.40–1.20)
GFR: 39.69 mL/min — ABNORMAL LOW (ref >60.00)
Glucose, Bld: 115 mg/dL — ABNORMAL HIGH (ref 70–99)
Potassium: 4.7 mEq/L (ref 3.5–5.1)
Sodium: 138 mEq/L (ref 135–145)
Total Bilirubin: 0.3 mg/dL (ref 0.2–1.2)
Total Protein: 7.3 g/dL (ref 6.0–8.3)

## 2015-06-14 LAB — HEMOGLOBIN A1C: Hgb A1c MFr Bld: 8.7 % — ABNORMAL HIGH (ref 4.6–6.5)

## 2015-06-19 ENCOUNTER — Ambulatory Visit (INDEPENDENT_AMBULATORY_CARE_PROVIDER_SITE_OTHER): Payer: Medicaid Other | Admitting: Endocrinology

## 2015-06-19 VITALS — BP 132/86 | HR 73 | Temp 98.7°F | Resp 14 | Ht 70.0 in | Wt 222.8 lb

## 2015-06-19 DIAGNOSIS — E1121 Type 2 diabetes mellitus with diabetic nephropathy: Secondary | ICD-10-CM

## 2015-06-19 DIAGNOSIS — E669 Obesity, unspecified: Secondary | ICD-10-CM | POA: Diagnosis not present

## 2015-06-19 DIAGNOSIS — E1165 Type 2 diabetes mellitus with hyperglycemia: Secondary | ICD-10-CM | POA: Diagnosis not present

## 2015-06-19 DIAGNOSIS — Z794 Long term (current) use of insulin: Secondary | ICD-10-CM | POA: Diagnosis not present

## 2015-06-19 MED ORDER — ALBIGLUTIDE 30 MG ~~LOC~~ PEN: PEN_INJECTOR | SUBCUTANEOUS | Status: DC

## 2015-06-19 NOTE — Progress Notes (Signed)
Patient ID: [name], female   DOB: [birth year], 50 y.o.   MRN: [id number]    Reason for Appointment: Followup for Type 2 Diabetes  History of Present Illness:          Diagnosis: Type 2 diabetes mellitus, date of diagnosis: 1992        Past history: She had symptomatic hyperglycemia at onset and was treated with metformin and Amaryl. However because of GI side effects from metformin this was not continued  And she thinks she was continued on Amaryl for several years. Subsequently in 2002 when she was pregnant she was treated with insulin  After her pregnancy insulin was stopped and she was back on oral agents Insulin was started again about 3 years ago because of poor control. She does not think she tried any other medications She has been on various insulin regimens in the last 3 years including NovoLog, Novolin 70/30, NovoLog mix 70/30 and Lantus  In 9/14 because of tendency to weight gain she was given a sample of Byetta 5 mcg to try but she could not tolerate this for over 2 weeks because of significant nausea. Has not tried Victoza previously She has had persistently poor control in the past with A1c at least 10% since about 2012 She had been taking NovoLog mix 70/30 since about 06/2012 along with Lantus  Recent history:   INSULIN regimen is described as: NovoLog 6-10 before meals    Lantus 45-50 hs Non-insulin hypoglycemic drugs the patient is taking are: None    She has again not been seen in follow-up for several months. She was not having any nausea with Victoza on her last visit in 9/16 and was having excellent control with A1c below 7% She now says that she was again having nausea and other abdominal symptoms late last year and with stopping the Victoza her symptoms improved but she did not call to inform us about this Blood sugars are overall worse with A1c now consistently over 8%  Current blood sugar patterns and problems:  She is taking variable dose of  Lantus at night based on her bedtime reading.  She thinks that if she does not have aren't of the high sugar and she takes 50 units she will get low sugars overnight  Her blood sugars during the day are quite variable.  Overall checking blood sugars infrequently and only about once a day or less  Mostly her sugars about suppertime are high, occasionally related to not taking her lunchtime NovoLog when blood sugars are new normal  Has a few readings after supper which appear to be relatively better, highest reading 274    Hypoglycemia: As above some readings lower overnight         Side effects from medications have been: Metformin caused diarrhea,?  Nausea from Victoza    Glucose monitoring:  less than 1 times a day       Glucometer:  Accu-Chek Aviva  Blood Glucose readings from    Mean values apply above for all meters except median for One Touch  PRE-MEAL Fasting Lunch Dinner Bedtime Overall  Glucose range: 79-166  90-194  163-214  96-274    Mean/median: 126    160      Glycemic control:   Lab Results  Component Value Date   HGBA1C 8.7* 06/14/2015   HGBA1C 8.50 03/15/2015   HGBA1C 6.8* 11/16/2014   Lab Results  Component Value Date   MICROALBUR 63.6* 11/16/2014  [place] 95 11/16/2014   CREATININE 1.49* 06/14/2015    Self-care: The diet that the patient has been following is: tries to limit carbohydrates, this is variable At breakfast she will have eggs and toast, usually will have a meat protein, cheese or egg at lunchtime sometimes no carbohydrate, dinner has a mixed meal with some carbohydrate. Usually avoiding high fat meals and fried food. Eating outside infrequently        Meals: 3 meals per day. lunch 12-2 Dinner 6 pm          Exercise:  treadmill Or other walking for about 20 minutes, uses this slowly 3/7 days a week but irregular Dietician visit: Most recent: At diagnosis.               Compliance with the medical regimen: Fair Complications: Peripheral  neuropathy with sensory loss and history of diabetic foot ulcer Retinal exam: Most recent:.2014     Weight history: Previous range 170-218  Wt Readings from Last 3 Encounters:  06/19/15 222 lb 12.8 oz (101.061 kg)  03/15/15 212 lb (96.163 kg)  12/29/14 213 lb 9.6 oz (96.888 kg)      Medication List       This list is accurate as of: 06/19/15  5:04 PM.  Always use your most recent med list.               ACCU-CHEK AVIVA PLUS w/Device Kit  CHECK BLOOD SUGARS THREE TIMES A DAY.  DX:250.03     Albiglutide 30 MG Pen  Commonly known as:  TANZEUM  Inject contents of pen weekly     ALPRAZolam 0.5 MG tablet  Commonly known as:  XANAX  Take 1 tablet (0.5 mg total) by mouth at bedtime as needed for anxiety or sleep.     calcium citrate-vitamin D 500-400 MG-UNIT chewable tablet  Chew 1 tablet by mouth 2 (two) times daily.     cyclobenzaprine 10 MG tablet  Commonly known as:  FLEXERIL  Take 1 tablet (10 mg total) by mouth 3 (three) times daily as needed. for muscle spams     diphenoxylate-atropine 2.5-0.025 MG tablet  Commonly known as:  LOMOTIL  Take 2 tablets by mouth 3 (three) times daily before meals. Please take thirty (30) minutes prior to meal.     DULoxetine 60 MG capsule  Commonly known as:  CYMBALTA  Take 1 capsule (60 mg total) by mouth daily.     gabapentin 100 MG capsule  Commonly known as:  NEURONTIN  Take 1 capsule (100 mg total) by mouth 3 (three) times daily.     glucose blood test strip  Commonly known as:  ACCU-CHEK AVIVA PLUS  USE TO CHECK BLOOD SUGAR BEFORE MEALS AND AT BEDTIME dx code E10.65     insulin aspart 100 UNIT/ML FlexPen  Commonly known as:  NOVOLOG  Inject 8-10 Units into the skin 3 (three) times daily with meals. As need for CBG > 130 with meals     LANTUS SOLOSTAR 100 UNIT/ML Solostar Pen  Generic drug:  Insulin Glargine  INJECT 50 UNITS INTO THE SKIN DAILY     lisinopril 40 MG tablet  Commonly known as:  PRINIVIL,ZESTRIL  Take 1  tablet (40 mg total) by mouth daily.     pregabalin 50 MG capsule  Commonly known as:  LYRICA  Take 1 capsule (50 mg total) by mouth 3 (three) times daily.     promethazine 25 MG tablet  Commonly known as:  PHENERGAN  Take 1 tablet (25 mg total) by mouth every 8 (eight) hours as needed for nausea or vomiting.     traMADol 50 MG tablet  Commonly known as:  ULTRAM  Take 1 tablet (50 mg total) by mouth every 8 (eight) hours as needed.     traZODone 50 MG tablet  Commonly known as:  DESYREL  Take 0.5-1 tablets (25-50 mg total) by mouth at bedtime as needed for sleep.        Allergies:  Allergies  Allergen Reactions  . Latex Rash    Past Medical History  Diagnosis Date  . Chest tightness   . Urinary frequency   . Nausea and vomiting in adult   . Diabetic neuropathy ([place])   . Yeast vaginitis   . Diabetes type 1, uncontrolled ([place])     at age 60  . GERD (gastroesophageal reflux disease)     about age of 77  . Essential hypertension 2015  . Depression 2001  . Anxiety 2002    Past Surgical History  Procedure Laterality Date  . Endometrial ablation    . Amputation Left 06/15/2012    Procedure: AMPUTATION DIGIT;  Surgeon: Meredith Pel, MD;  Location: [place];  Service: Orthopedics;  Laterality: Left;  Left great toe revision amputation  . Achilles tendon surgery Left 03/10/2013    Procedure: LEFT CHOPART AMPUTATION/ LEFT TENDON ACHILLES [place];  Surgeon: Wylene Simmer, MD;  Location: [place];  Service: Orthopedics;  Laterality: Left;  . Laparoscopic cholecystectomy  01/30/2015    [facility] surgery     Family History  Problem Relation Age of Onset  . Diabetes Mother   . Heart attack Mother   . Heart disease Mother     before age 25  . Hypertension Mother   . Hyperlipidemia Mother   . Diabetes Father   . Heart attack Father   . Heart disease Father     before age 48  . Diabetes Sister   . Hypertension Sister     Social History:  reports that she has been  smoking Cigarettes.  She has a 11 pack-year smoking history. She has never used smokeless tobacco. She reports that she drinks alcohol. She reports that she does not use illicit drugs.    Review of Systems    She had GI symptoms in 1/17 and some evidence of pancreatitis. 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  She is  taking 60 mg of Cymbalta with some improved relief   Physical Examination:  BP 132/86 mmHg  Pulse 73  Temp(Src) 98.7 F (37.1 C)  Resp 14  Ht '5\' 10"'  (1.778 m)  Wt 222 lb 12.8 oz (101.061 kg)  BMI 31.97 kg/m2  SpO2 94%     No ankle edema  ASSESSMENT:  Diabetes type 2 with mild obesity  See history  of present illness for detailed discussion of current diabetes management, blood sugar patterns and problems identified Her blood sugars are poorly controlled with the patient stopping Victoza which was keeping her A1c below 7% and keeping more evening control of her diabetes She has irregular meal times and meal plans Not using carbohydrate counting for mealtime insulin Also currently adjusting Lantus arbitrarily at night based on bedtime reading She is intolerant to metformin and not able to take Invokana effectively because of renal dysfunction  She can probably lose weight with optimal diet and exercise which she is not able to do  RENAL dysfunction/microalbuminuria:  Creatinine is slightly better  PLAN:   To try Lantus in the morning instead of evening to avoid nocturnal hypoglycemia and to try and adjust the dose based on fasting readings  If her fasting readings go up will need to switch to [place] or [facility]  Consultation with dietitian to start carbohydrate counting  Trial of Tanzeum 30 mg weekly  She will need to have a meal planning reviewed with dietitian  More consistent glucose monitoring at various times  Patient Instructions  Change Lantus to 50 daily in am and adjusted based on am sugar over 3 days  Novolog based on Carbs  Tanzeum 30 weekly    Counseling time on subjects discussed above is over 50% of today's 25 minute visit    Abiageal Blowe 06/19/2015, 5:04 PM

## 2015-06-19 NOTE — Patient Instructions (Signed)
Change Lantus to 50 daily in am and adjusted based on am sugar over 3 days  Novolog based on Carbs  Tanzeum 30 weekly

## 2015-06-25 ENCOUNTER — Encounter: Payer: Medicaid Other | Admitting: Nutrition

## 2015-07-08 ENCOUNTER — Other Ambulatory Visit: Payer: Self-pay | Admitting: Family Medicine

## 2015-07-08 DIAGNOSIS — F32A Depression, unspecified: Secondary | ICD-10-CM

## 2015-07-08 DIAGNOSIS — G4700 Insomnia, unspecified: Secondary | ICD-10-CM

## 2015-07-08 DIAGNOSIS — F329 Major depressive disorder, single episode, unspecified: Secondary | ICD-10-CM

## 2015-07-08 DIAGNOSIS — F419 Anxiety disorder, unspecified: Principal | ICD-10-CM

## 2015-07-10 ENCOUNTER — Other Ambulatory Visit: Payer: Self-pay | Admitting: Endocrinology

## 2015-07-10 NOTE — Telephone Encounter (Signed)
Pt is out of lantus please call into [facility]

## 2015-07-11 ENCOUNTER — Other Ambulatory Visit: Payer: Self-pay | Admitting: *Deleted

## 2015-07-11 DIAGNOSIS — K588 Other irritable bowel syndrome: Secondary | ICD-10-CM

## 2015-07-11 MED ORDER — DIPHENOXYLATE-ATROPINE 2.5-0.025 MG PO TABS: 2.0000 | ORAL_TABLET | Freq: Three times a day (TID) | ORAL | Status: DC

## 2015-07-12 ENCOUNTER — Telehealth: Payer: Self-pay | Admitting: Family Medicine

## 2015-07-12 DIAGNOSIS — K588 Other irritable bowel syndrome: Secondary | ICD-10-CM

## 2015-07-12 DIAGNOSIS — E1140 Type 2 diabetes mellitus with diabetic neuropathy, unspecified: Secondary | ICD-10-CM

## 2015-07-12 MED ORDER — DIPHENOXYLATE-ATROPINE 2.5-0.025 MG PO TABS: 2.0000 | ORAL_TABLET | Freq: Three times a day (TID) | ORAL | Status: DC

## 2015-07-12 NOTE — Telephone Encounter (Signed)
Pt. Came into facility requesting a refill on ALPRAZolam (XANAX) 0.5 MG tablet. Pt. Would also like for her PCP to raise her dosage on gabapentin (NEURONTIN) 100 MG capsule. Please f/u with pt.

## 2015-07-12 NOTE — Telephone Encounter (Signed)
Please fax lomotil Rx

## 2015-07-13 ENCOUNTER — Other Ambulatory Visit: Payer: Self-pay | Admitting: Family Medicine

## 2015-07-13 DIAGNOSIS — F32A Depression, unspecified: Secondary | ICD-10-CM

## 2015-07-13 DIAGNOSIS — F329 Major depressive disorder, single episode, unspecified: Secondary | ICD-10-CM

## 2015-07-13 DIAGNOSIS — F419 Anxiety disorder, unspecified: Principal | ICD-10-CM

## 2015-07-13 MED ORDER — GABAPENTIN 100 MG PO CAPS: 100.0000 mg | ORAL_CAPSULE | Freq: Three times a day (TID) | ORAL | Status: DC

## 2015-07-13 MED ORDER — ALPRAZOLAM 0.5 MG PO TABS: 0.5000 mg | ORAL_TABLET | Freq: Every evening | ORAL | Status: DC | PRN

## 2015-07-13 NOTE — Telephone Encounter (Signed)
Pt notified Rx at front office ready to be pickup 

## 2015-07-20 ENCOUNTER — Telehealth: Payer: Self-pay | Admitting: Family Medicine

## 2015-07-20 DIAGNOSIS — E1140 Type 2 diabetes mellitus with diabetic neuropathy, unspecified: Secondary | ICD-10-CM

## 2015-07-20 NOTE — Telephone Encounter (Signed)
Pt. Came into facility stating she had asked for her Gabapentin dosage to be raised and it was not done. Please f/u with pt.

## 2015-07-23 MED ORDER — GABAPENTIN 300 MG PO CAPS: 300.0000 mg | ORAL_CAPSULE | Freq: Three times a day (TID) | ORAL | Status: DC

## 2015-07-23 NOTE — Telephone Encounter (Signed)
Gabapentin increased to 300 mg TID Please inform patient

## 2015-07-24 NOTE — Telephone Encounter (Signed)
Pt notified Rx Gabapentin increased to 300mg  TID Pt verbalized understanding

## 2015-08-01 ENCOUNTER — Encounter: Payer: Self-pay | Admitting: Endocrinology

## 2015-08-01 ENCOUNTER — Ambulatory Visit (INDEPENDENT_AMBULATORY_CARE_PROVIDER_SITE_OTHER): Payer: Medicaid Other | Admitting: Endocrinology

## 2015-08-01 VITALS — BP 130/76 | HR 76 | Temp 98.4°F | Resp 12 | Wt 220.6 lb

## 2015-08-01 DIAGNOSIS — E1165 Type 2 diabetes mellitus with hyperglycemia: Secondary | ICD-10-CM

## 2015-08-01 DIAGNOSIS — Z794 Long term (current) use of insulin: Secondary | ICD-10-CM

## 2015-08-01 DIAGNOSIS — E1140 Type 2 diabetes mellitus with diabetic neuropathy, unspecified: Secondary | ICD-10-CM | POA: Diagnosis not present

## 2015-08-01 LAB — BASIC METABOLIC PANEL
BUN: 16 mg/dL (ref 6–23)
CO2: 30 mEq/L (ref 19–32)
Calcium: 9.4 mg/dL (ref 8.4–10.5)
Chloride: 105 mEq/L (ref 96–112)
Creatinine, Ser: 1.61 mg/dL — ABNORMAL HIGH (ref 0.40–1.20)
GFR: 36.28 mL/min — ABNORMAL LOW (ref >60.00)
Glucose, Bld: 176 mg/dL — ABNORMAL HIGH (ref 70–99)
Potassium: 4.5 mEq/L (ref 3.5–5.1)
Sodium: 140 mEq/L (ref 135–145)

## 2015-08-01 MED ORDER — PREGABALIN 50 MG PO CAPS: 50.0000 mg | ORAL_CAPSULE | Freq: Three times a day (TID) | ORAL | Status: DC

## 2015-08-01 MED ORDER — ALBIGLUTIDE 30 MG ~~LOC~~ PEN: PEN_INJECTOR | SUBCUTANEOUS | Status: DC

## 2015-08-01 NOTE — Patient Instructions (Addendum)
Check blood sugars on waking up 3  times a week Also check blood sugars about 2 hours after a meal and do this after different meals by rotation  Recommended blood sugar levels on waking up is 90-130 and about 2 hours after meal is 130-160  Please bring your blood sugar monitor to each visit, thank you  Lantus 44 units ON WAKING up, CALL IF AM SUGARS GO UP  Keep other meter readings in diary  WALK DAILY

## 2015-08-01 NOTE — Progress Notes (Signed)
Patient ID: [name], female   DOB: [birth year], 50 y.o.   MRN: [id number]    Reason for Appointment: Followup for Type 2 Diabetes  History of Present Illness:          Diagnosis: Type 2 diabetes mellitus, date of diagnosis: 1992        Past history: She had symptomatic hyperglycemia at onset and was treated with metformin and Amaryl. However because of GI side effects from metformin this was not continued  And she thinks she was continued on Amaryl for several years. Subsequently in 2002 when she was pregnant she was treated with insulin  After her pregnancy insulin was stopped and she was back on oral agents Insulin was started again about 3 years ago because of poor control. She does not think she tried any other medications She has been on various insulin regimens in the last 3 years including NovoLog, Novolin 70/30, NovoLog mix 70/30 and Lantus  In 9/14 because of tendency to weight gain she was given a sample of Byetta 5 mcg to try but she could not tolerate this for over 2 weeks because of significant nausea. Has not tried Victoza previously She has had persistently poor control in the past with A1c at least 10% since about 2012 She had been taking NovoLog mix 70/30 since about 06/2012 along with Lantus  Recent history:   INSULIN regimen is described as: NovoLog 8-10 before acs Lantus 45-50 hs  Non-insulin hypoglycemic drugs the patient is taking are: Tanzeum 30 mg weekly since 4/17    Blood sugars on her last visit were overall worse with A1c  consistently over 8% She was started on Tanzeum in addition to her basal bolus insulin She has been able to do this without any difficulties She was also told to change her Lantus to the morning instead of night but she forgot and also did not change her dose  Current blood sugar patterns and problems:  She is having much better blood sugar control now with her glucose averaging only 115 and fructosamine down to  244  She is still getting tendency to low blood sugars in the mornings as early as 5:30 AM and did not change her Lantus  Has mostly good blood sugars later in the day with only occasional high readings based on her diet  She thinks she is generally watching her portions better with starting Tanzeum and her weight is down 2 pounds  Still not able to exercise much  Overall checking blood sugars somewhat infrequently but now she is sometimes using a prodigy meter also  Hypoglycemia: As above some readings lower overnight         Side effects from medications have been: Metformin caused diarrhea,?  Nausea from Victoza   Glucose monitoring:  less than 1 times a day       Glucometer:  Accu-Chek Aviva  Blood Glucose readings from   Mean values apply above for all meters except median for One Touch  PRE-MEAL Fasting Lunch Dinner Bedtime Overall  Glucose range: 49-136  69-164  107  91-240    Mean/median:     115    Glycemic control:   Lab Results  Component Value Date   HGBA1C 8.7* 06/14/2015   HGBA1C 8.50 03/15/2015   HGBA1C 6.8* 11/16/2014   Lab Results  Component Value Date   MICROALBUR 63.6* 11/16/2014   [place] 95 11/16/2014   CREATININE 1.61* 08/01/2015    Self-care: The diet that  the patient has been following is: tries to limit carbohydrates, this is variable At breakfast she will have eggs and toast, usually will have a meat protein, cheese or egg at lunchtime sometimes no carbohydrate, dinner has a mixed meal with some carbohydrate. Usually avoiding high fat meals and fried food.  Eating outside infrequently        Meals: 3 meals per day. lunch 12-2 Dinner 6 pm          Exercise:  treadmill Or other walking for about 20 minutes, uses this slowly 3/7 days a week but not now Dietician visit: Most recent: At diagnosis.               Compliance with the medical regimen: Fair Complications: Peripheral neuropathy with sensory loss and history of diabetic foot  ulcer Retinal exam: Most recent:.2014     Weight history: Previous range 170-218  Wt Readings from Last 3 Encounters:  08/01/15 220 lb 9.6 oz (100.064 kg)  06/19/15 222 lb 12.8 oz (101.061 kg)  03/15/15 212 lb (96.163 kg)    Office Visit on 08/01/2015  Component Date Value Ref Range Status  . Fructosamine 08/01/2015 244  0 - 285 umol/L Final   Comment: Published reference interval for apparently healthy subjects between age 59 and 57 is 32 - 285 umol/L and in a poorly controlled diabetic population is 228 - 563 umol/L with a mean of 396 umol/L.   Marland Kitchen Sodium 08/01/2015 140  135 - 145 mEq/L Final  . Potassium 08/01/2015 4.5  3.5 - 5.1 mEq/L Final  . Chloride 08/01/2015 105  96 - 112 mEq/L Final  . CO2 08/01/2015 30  19 - 32 mEq/L Final  . Glucose, Bld 08/01/2015 176* 70 - 99 mg/dL Final  . BUN 08/01/2015 16  6 - 23 mg/dL Final  . Creatinine, Ser 08/01/2015 1.61* 0.40 - 1.20 mg/dL Final  . Calcium 08/01/2015 9.4  8.4 - 10.5 mg/dL Final  . GFR 08/01/2015 36.28* >60.00 mL/min Final      Medication List       This list is accurate as of: 08/01/15 11:59 PM.  Always use your most recent med list.               ACCU-CHEK AVIVA PLUS w/Device Kit  CHECK BLOOD SUGARS THREE TIMES A DAY.  DX:250.03     Albiglutide 30 MG Pen  Commonly known as:  TANZEUM  Inject contents of pen weekly     ALPRAZolam 0.5 MG tablet  Commonly known as:  XANAX  Take 1 tablet (0.5 mg total) by mouth at bedtime as needed for anxiety or sleep.     calcium citrate-vitamin D 500-400 MG-UNIT chewable tablet  Chew 1 tablet by mouth 2 (two) times daily.     cyclobenzaprine 10 MG tablet  Commonly known as:  FLEXERIL  Take 1 tablet (10 mg total) by mouth 3 (three) times daily as needed. for muscle spams     diphenoxylate-atropine 2.5-0.025 MG tablet  Commonly known as:  LOMOTIL  Take 2 tablets by mouth 3 (three) times daily before meals. Please take thirty (30) minutes prior to meal.     DULoxetine 60  MG capsule  Commonly known as:  CYMBALTA  Take 1 capsule (60 mg total) by mouth daily.     gabapentin 300 MG capsule  Commonly known as:  NEURONTIN  Take 1 capsule (300 mg total) by mouth 3 (three) times daily.     glucose blood test strip  Commonly known as:  ACCU-CHEK AVIVA PLUS  USE TO CHECK BLOOD SUGAR BEFORE MEALS AND AT BEDTIME dx code E10.65     insulin aspart 100 UNIT/ML FlexPen  Commonly known as:  NOVOLOG  Inject 8-10 Units into the skin 3 (three) times daily with meals. As need for CBG > 130 with meals     LANTUS SOLOSTAR 100 UNIT/ML Solostar Pen  Generic drug:  Insulin Glargine  INJECT 50 UNITS SUBCUTANEOUSLY ONCE DAILY     lisinopril 40 MG tablet  Commonly known as:  PRINIVIL,ZESTRIL  Take 1 tablet (40 mg total) by mouth daily.     pregabalin 50 MG capsule  Commonly known as:  LYRICA  Take 1 capsule (50 mg total) by mouth 3 (three) times daily.     promethazine 25 MG tablet  Commonly known as:  PHENERGAN  Take 1 tablet (25 mg total) by mouth every 8 (eight) hours as needed for nausea or vomiting.     traMADol 50 MG tablet  Commonly known as:  ULTRAM  Take 1 tablet (50 mg total) by mouth every 8 (eight) hours as needed.     traZODone 50 MG tablet  Commonly known as:  DESYREL  TAKE ONE-HALF TO ONE TABLET BY MOUTH AT BEDTIME AS NEEDED FOR SLEEP        Allergies:  Allergies  Allergen Reactions  . Latex Rash    Past Medical History  Diagnosis Date  . Chest tightness   . Urinary frequency   . Nausea and vomiting in adult   . Diabetic neuropathy ([place])   . Yeast vaginitis   . Diabetes type 1, uncontrolled ([place])     at age 89  . GERD (gastroesophageal reflux disease)     about age of 35  . Essential hypertension 2015  . Depression 2001  . Anxiety 2002    Past Surgical History  Procedure Laterality Date  . Endometrial ablation    . Amputation Left 06/15/2012    Procedure: AMPUTATION DIGIT;  Surgeon: Meredith Pel, MD;  Location: [facility];   Service: Orthopedics;  Laterality: Left;  Left great toe revision amputation  . Achilles tendon surgery Left 03/10/2013    Procedure: LEFT CHOPART AMPUTATION/ LEFT TENDON ACHILLES [place];  Surgeon: Wylene Simmer, MD;  Location: [facility];  Service: Orthopedics;  Laterality: Left;  . Laparoscopic cholecystectomy  01/30/2015    [facility] day surgery     Family History  Problem Relation Age of Onset  . Diabetes Mother   . Heart attack Mother   . Heart disease Mother     before age 47  . Hypertension Mother   . Hyperlipidemia Mother   . Diabetes Father   . Heart attack Father   . Heart disease Father     before age 58  . Diabetes Sister   . Hypertension Sister     Social History:  reports that she has been smoking Cigarettes.  She has a 11 pack-year smoking history. She has never used smokeless tobacco. She reports that she drinks alcohol. She reports that she does not use illicit drugs.    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   She is  taking 60 mg of Cymbalta with some improved relief   Physical Examination:  BP 130/76 mmHg  Pulse 76  Temp(Src) 98.4 F (36.9 C) (Oral)  Resp 12  Wt 220 lb 9.6 oz (100.064 kg)  SpO2 94%      ASSESSMENT:  Diabetes type 2 with mild obesity  See history of present illness for detailed discussion of current  diabetes management, blood sugar patterns and problems identified Her blood sugars are much better controlled with adding Tanzeum, previously on Victoza She is tolerating this well without any nausea or significant abdominal discomfort She tends to have high readings when she goes off her diet which is less often now Also difficult to assess her home readings accurately since she is using a prodigy meter that she did not bring  Weight is slightly better  RENAL dysfunction/microalbuminuria: Creatinine is still increased, followed by nephrologist   PLAN:   To try Lantus 44 units in the morning instead of evening   Discussed her instructions for insulin and monitoring  She will try to keep a record of all her blood sugars even if using the other meter  Continue Tanzeum  Consistent diet as discussed  Walk daily for exercise  Patient Instructions  Check blood sugars on waking up 3  times a week Also check blood sugars about 2 hours after a meal and do this after different meals by rotation  Recommended blood sugar levels on waking up is 90-130 and about 2 hours after meal is 130-160  Please bring your blood sugar monitor to each visit, thank you  Lantus 44 units ON WAKING up, CALL IF AM SUGARS GO UP  Keep other meter readings in diary  WALK DAILY   Counseling time on subjects discussed above is over 50% of today's 25 minute visit    Daenerys Buttram 08/02/2015, 12:51 PM

## 2015-08-02 LAB — FRUCTOSAMINE: Fructosamine: 244 umol/L (ref 0–285)

## 2015-09-29 ENCOUNTER — Other Ambulatory Visit: Payer: Self-pay | Admitting: Endocrinology

## 2015-10-26 ENCOUNTER — Other Ambulatory Visit: Payer: Medicaid Other

## 2015-10-31 ENCOUNTER — Ambulatory Visit: Payer: Medicaid Other | Admitting: Endocrinology

## 2015-10-31 DIAGNOSIS — Z0289 Encounter for other administrative examinations: Secondary | ICD-10-CM

## 2015-12-25 ENCOUNTER — Telehealth: Payer: Self-pay | Admitting: Family Medicine

## 2015-12-25 NOTE — Telephone Encounter (Signed)
Patient called the office to speak with nurse regarding a referral that she needs PCP to write to [facility]. Pt is concerned about a red spot on left foot. There's some swelling. Please follow up.  Thank you.

## 2015-12-26 NOTE — Telephone Encounter (Signed)
Will route to PCP 

## 2015-12-27 NOTE — Telephone Encounter (Signed)
Patient will need to have foot evaluated to determine if ortho referral is need or something else (? Antibiotics, podiatry referral)  Advised to schedule OV with me or first available provider

## 2016-01-01 NOTE — Telephone Encounter (Signed)
Left message requesting return call to [facility].  Attempted to advise patient per Dr. Adrian Blackwater: Patient will need to have foot evaluated to determine if ortho referral is need or something else (? Antibiotics, podiatry referral)  Advised to schedule OV with me or first available provider

## 2016-01-04 ENCOUNTER — Other Ambulatory Visit: Payer: Self-pay | Admitting: Family Medicine

## 2016-01-04 ENCOUNTER — Other Ambulatory Visit: Payer: Self-pay | Admitting: Endocrinology

## 2016-01-04 DIAGNOSIS — E1140 Type 2 diabetes mellitus with diabetic neuropathy, unspecified: Secondary | ICD-10-CM

## 2016-01-10 ENCOUNTER — Encounter: Payer: Self-pay | Admitting: Family Medicine

## 2016-01-10 ENCOUNTER — Ambulatory Visit: Payer: Medicaid Other | Attending: Family Medicine | Admitting: Family Medicine

## 2016-01-10 VITALS — BP 147/74 | HR 81 | Temp 98.8°F | Ht 70.0 in | Wt 220.8 lb

## 2016-01-10 DIAGNOSIS — Z794 Long term (current) use of insulin: Secondary | ICD-10-CM | POA: Insufficient documentation

## 2016-01-10 DIAGNOSIS — F17210 Nicotine dependence, cigarettes, uncomplicated: Secondary | ICD-10-CM | POA: Diagnosis not present

## 2016-01-10 DIAGNOSIS — G4700 Insomnia, unspecified: Secondary | ICD-10-CM | POA: Insufficient documentation

## 2016-01-10 DIAGNOSIS — E118 Type 2 diabetes mellitus with unspecified complications: Secondary | ICD-10-CM

## 2016-01-10 DIAGNOSIS — F329 Major depressive disorder, single episode, unspecified: Secondary | ICD-10-CM | POA: Insufficient documentation

## 2016-01-10 DIAGNOSIS — F419 Anxiety disorder, unspecified: Secondary | ICD-10-CM | POA: Diagnosis not present

## 2016-01-10 DIAGNOSIS — E1140 Type 2 diabetes mellitus with diabetic neuropathy, unspecified: Secondary | ICD-10-CM

## 2016-01-10 DIAGNOSIS — F32A Depression, unspecified: Secondary | ICD-10-CM

## 2016-01-10 DIAGNOSIS — F418 Other specified anxiety disorders: Secondary | ICD-10-CM | POA: Diagnosis not present

## 2016-01-10 DIAGNOSIS — Z79899 Other long term (current) drug therapy: Secondary | ICD-10-CM | POA: Diagnosis not present

## 2016-01-10 DIAGNOSIS — G4709 Other insomnia: Secondary | ICD-10-CM

## 2016-01-10 DIAGNOSIS — E1165 Type 2 diabetes mellitus with hyperglycemia: Secondary | ICD-10-CM | POA: Diagnosis not present

## 2016-01-10 DIAGNOSIS — IMO0002 Reserved for concepts with insufficient information to code with codable children: Secondary | ICD-10-CM

## 2016-01-10 DIAGNOSIS — M7989 Other specified soft tissue disorders: Secondary | ICD-10-CM | POA: Diagnosis not present

## 2016-01-10 LAB — BASIC METABOLIC PANEL WITH GFR
BUN: 26 mg/dL — ABNORMAL HIGH (ref 7–25)
CO2: 27 mmol/L (ref 20–31)
Calcium: 9 mg/dL (ref 8.6–10.2)
Chloride: 103 mmol/L (ref 98–110)
Creat: 1.92 mg/dL — ABNORMAL HIGH (ref 0.50–1.10)
GFR, Est African American: 35 mL/min — ABNORMAL LOW (ref >=60)
GFR, Est Non African American: 30 mL/min — ABNORMAL LOW (ref >=60)
Glucose, Bld: 174 mg/dL — ABNORMAL HIGH (ref 65–99)
Potassium: 4.4 mmol/L (ref 3.5–5.3)
Sodium: 140 mmol/L (ref 135–146)

## 2016-01-10 LAB — POCT GLYCOSYLATED HEMOGLOBIN (HGB A1C): Hemoglobin A1C: 13.8

## 2016-01-10 LAB — VITAMIN B12: Vitamin B-12: 670 pg/mL (ref 200–1100)

## 2016-01-10 LAB — GLUCOSE, POCT (MANUAL RESULT ENTRY): POC Glucose: 166 mg/dl — AB (ref 70–99)

## 2016-01-10 MED ORDER — CYCLOBENZAPRINE HCL 10 MG PO TABS: 10.0000 mg | ORAL_TABLET | Freq: Three times a day (TID) | ORAL | 3 refills | Status: DC | PRN

## 2016-01-10 MED ORDER — DULOXETINE HCL 60 MG PO CPEP: 60.0000 mg | ORAL_CAPSULE | Freq: Every day | ORAL | 5 refills | Status: DC

## 2016-01-10 MED ORDER — ACETAMINOPHEN-CODEINE #4 300-60 MG PO TABS: 1.0000 | ORAL_TABLET | Freq: Three times a day (TID) | ORAL | 1 refills | Status: DC | PRN

## 2016-01-10 MED ORDER — INSULIN ASPART 100 UNIT/ML FLEXPEN: 15.0000 [IU] | PEN_INJECTOR | Freq: Three times a day (TID) | SUBCUTANEOUS | 2 refills | Status: DC

## 2016-01-10 MED ORDER — TRAZODONE HCL 100 MG PO TABS: 100.0000 mg | ORAL_TABLET | Freq: Every day | ORAL | 5 refills | Status: DC

## 2016-01-10 NOTE — Progress Notes (Signed)
Pt is here today for both legs swelling.  Pt declined flu shot.

## 2016-01-10 NOTE — Progress Notes (Signed)
Subjective:  Patient ID: [name], female    DOB: Dec 15, 1965  Age: 50 y.o. MRN: [id number]  CC: Diabetes and Leg Swelling   HPI [name] presents for   1. CHRONIC DIABETES  Disease Monitoring  Blood Sugar Ranges: not checking   Polyuria: yes   Visual problems: no   Medication Compliance: yes  Medication Side Effects  Hypoglycemia: no   Preventitive Health Care  Eye Exam: due   Foot Exam: done today   Diet pattern: poor appetite but eating high sugar   Exercise: no   2. Leg cramping: for about 6 weeks. Comes at night. Comes and goes. Severe. Both legs. Occur in upper and lower legs. She has tried sodium without improvement. Taking 20 mg of flexeril at night which dose help sometimes. Tramadol is not helping with pain. She had a tooth pulled and took two percocet which helped. She does not feel dehydrated. Blood sugars and pressures have been running high.    Social History  Substance Use Topics  . Smoking status: Light Tobacco Smoker    Packs/day: 0.50    Years: 22.00    Types: Cigarettes  . Smokeless tobacco: Never Used  . Alcohol use 0.0 oz/week     Comment: sociial    Outpatient Medications Prior to Visit  Medication Sig Dispense Refill  . Albiglutide (TANZEUM) 30 MG PEN Inject contents of pen weekly 4 each 1  . ALPRAZolam (XANAX) 0.5 MG tablet Take 1 tablet (0.5 mg total) by mouth at bedtime as needed for anxiety or sleep. 30 tablet 2  . Blood Glucose Monitoring Suppl (ACCU-CHEK AVIVA PLUS) W/DEVICE KIT CHECK BLOOD SUGARS THREE TIMES A DAY.  DX:250.03 1 kit 0  . calcium citrate-vitamin D 500-400 MG-UNIT chewable tablet Chew 1 tablet by mouth 2 (two) times daily. 180 tablet 1  . cyclobenzaprine (FLEXERIL) 10 MG tablet TAKE ONE TABLET BY MOUTH THREE TIMES DAILY AS NEEDED FOR MUSCLE SPASMS 30 tablet 0  . diphenoxylate-atropine (LOMOTIL) 2.5-0.025 MG tablet Take 2 tablets by mouth 3 (three) times daily before meals. Please take thirty (30) minutes prior  to meal. 180 tablet 2  . DULoxetine (CYMBALTA) 60 MG capsule TAKE ONE CAPSULE BY MOUTH ONCE DAILY 30 capsule 0  . gabapentin (NEURONTIN) 300 MG capsule Take 1 capsule (300 mg total) by mouth 3 (three) times daily. 90 capsule 5  . glucose blood (ACCU-CHEK AVIVA PLUS) test strip USE TO CHECK BLOOD SUGAR BEFORE MEALS AND AT BEDTIME dx code E10.65 125 each 3  . LANTUS SOLOSTAR 100 UNIT/ML Solostar Pen INJECT 50 UNITS SUBCUTANEOUSLY ONCE DAILY 15 mL 3  . lisinopril (PRINIVIL,ZESTRIL) 40 MG tablet Take 1 tablet (40 mg total) by mouth daily. 30 tablet 5  . NOVOLOG FLEXPEN 100 UNIT/ML FlexPen INJECT 8 TO 10 UNITS INTO THE SKIN 3 TIMES DAILY WITH MEALS *USE AS NEEDED FOR CBG GREATER THAN 130 15 mL 2  . pregabalin (LYRICA) 50 MG capsule Take 1 capsule (50 mg total) by mouth 3 (three) times daily. 270 capsule 3  . promethazine (PHENERGAN) 25 MG tablet Take 1 tablet (25 mg total) by mouth every 8 (eight) hours as needed for nausea or vomiting. 20 tablet 0  . traZODone (DESYREL) 50 MG tablet TAKE ONE-HALF TO ONE TABLET BY MOUTH AT BEDTIME AS NEEDED FOR SLEEP 30 tablet 5  . traMADol (ULTRAM) 50 MG tablet Take 1 tablet (50 mg total) by mouth every 8 (eight) hours as needed. (Patient not taking: Reported on 01/10/2016)  60 tablet 2   No facility-administered medications prior to visit.     ROS Review of Systems  Constitutional: Negative for chills and fever.  Eyes: Negative for visual disturbance.  Respiratory: Negative for shortness of breath.   Cardiovascular: Positive for leg swelling. Negative for chest pain.  Gastrointestinal: Negative for abdominal distention, abdominal pain, anal bleeding, blood in stool, constipation, diarrhea, nausea, rectal pain and vomiting.  Musculoskeletal: Positive for myalgias. Negative for arthralgias and back pain.  Skin: Negative for rash.  Allergic/Immunologic: Negative for immunocompromised state.  Hematological: Negative for adenopathy. Does not bruise/bleed easily.    Psychiatric/Behavioral: Positive for dysphoric mood and self-injury. Negative for suicidal ideas.    Objective:  BP (!) 147/74 (BP Location: Right Arm, Patient Position: Sitting, Cuff Size: Small)   Pulse 81   Temp 98.8 F (37.1 C) (Oral)   Ht _0  (1.778 m)   Wt 220 lb 12.8 oz (100.2 kg)   SpO2 94%   BMI 31.68 kg/m   BP/Weight 01/10/2016 08/01/2015 3/90/3009  Systolic BP 233 007 622  Diastolic BP 74 76 86  Wt. (Lbs) 220.8 220.6 222.8  BMI 31.68 31.65 31.97   Wt Readings from Last 3 Encounters:  01/10/16 220 lb 12.8 oz (100.2 kg)  08/01/15 220 lb 9.6 oz (100.1 kg)  06/19/15 222 lb 12.8 oz (101.1 kg)    Physical Exam  Constitutional: She is oriented to person, place, and time. She appears well-developed and well-nourished. No distress.  HENT:  Head: Normocephalic and atraumatic.  Cardiovascular: Normal rate, regular rhythm, normal heart sounds and intact distal pulses.   Pulmonary/Chest: Effort normal and breath sounds normal.  Musculoskeletal: She exhibits no edema.  Neurological: She is alert and oriented to person, place, and time.  Skin: Skin is warm and dry. No rash noted.  Psychiatric: She has a normal mood and affect.   Lab Results  Component Value Date   HGBA1C 8.7 (H) 06/14/2015   Lab Results  Component Value Date   HGBA1C 13.8 01/10/2016    CBG 166  Depression screen [facility] 2/9 01/10/2016 03/15/2015 02/14/2014  Decreased Interest _1 Down, Depressed, Hopeless _2 PHQ - 2 Score _3 Altered sleeping 3 3 -  Tired, decreased energy 3 3 -  Change in appetite 3 3 -  Feeling bad or failure about yourself  2 1 -  Trouble concentrating 3 2 -  Moving slowly or fidgety/restless 3 1 -  Suicidal thoughts 0 0 -  PHQ-9 Score 23 19 -   GAD 7 : Generalized Anxiety Score 01/10/2016 03/15/2015  Nervous, Anxious, on Edge 3 3  Control/stop worrying 3 3  Worry too much - different things 3 3  Trouble relaxing 2 3  Restless 2 3  Easily annoyed or irritable 3 2   Afraid - awful might happen 2 2  Total GAD 7 Score 18 19     Assessment & Plan:  [facility] was seen today for diabetes and leg swelling.  Diagnoses and all orders for this visit:  Uncontrolled type 2 diabetes mellitus with complication, with long-term current use of insulin (HCC) -     POCT glucose (manual entry) -     POCT glycosylated hemoglobin (Hb A1C) -     BASIC METABOLIC PANEL WITH GFR -     insulin aspart (NOVOLOG FLEXPEN) 100 UNIT/ML FlexPen; Inject 15 Units into the skin 3 (three) times daily with meals.  Anxiety and depression -  traZODone (DESYREL) 100 MG tablet; Take 1 tablet (100 mg total) by mouth at bedtime.  Other insomnia -     FSH/LH  Diabetic neuropathy, painful (HCC) -     Vitamin B12 -     acetaminophen-codeine (TYLENOL #4) 300-60 MG tablet; Take 1 tablet by mouth every 8 (eight) hours as needed for moderate pain. -     DULoxetine (CYMBALTA) 60 MG capsule; Take 1 capsule (60 mg total) by mouth daily. -     cyclobenzaprine (FLEXERIL) 10 MG tablet; Take 1 tablet (10 mg total) by mouth 3 (three) times daily as needed for muscle spasms.  Insomnia, unspecified type -     traZODone (DESYREL) 100 MG tablet; Take 1 tablet (100 mg total) by mouth at bedtime.   No orders of the defined types were placed in this encounter.   Follow-up: Return in about 6 weeks (around 02/21/2016) for blood sugar review .   Boykin Nearing MD

## 2016-01-10 NOTE — Patient Instructions (Addendum)
[name] was seen today for diabetes and leg swelling.  Diagnoses and all orders for this visit:  Uncontrolled type 2 diabetes mellitus with complication, with long-term current use of insulin (HCC) -     POCT glucose (manual entry) -     POCT glycosylated hemoglobin (Hb A1C) -     BASIC METABOLIC PANEL WITH GFR -     insulin aspart (NOVOLOG FLEXPEN) 100 UNIT/ML FlexPen; Inject 15 Units into the skin 3 (three) times daily with meals.  Anxiety and depression -     traZODone (DESYREL) 100 MG tablet; Take 1 tablet (100 mg total) by mouth at bedtime.  Other insomnia -     FSH/LH  Diabetic neuropathy, painful (HCC) -     Vitamin B12 -     acetaminophen-codeine (TYLENOL #4) 300-60 MG tablet; Take 1 tablet by mouth every 8 (eight) hours as needed for moderate pain. -     DULoxetine (CYMBALTA) 60 MG capsule; Take 1 capsule (60 mg total) by mouth daily. -     cyclobenzaprine (FLEXERIL) 10 MG tablet; Take 1 tablet (10 mg total) by mouth 3 (three) times daily as needed for muscle spasms.  Insomnia, unspecified type -     traZODone (DESYREL) 100 MG tablet; Take 1 tablet (100 mg total) by mouth at bedtime.  Diabetes blood sugar goals  Fasting (in AM before breakfast, 8 hrs of no eating or drinking (except water or unsweetened coffee or tea): 90-110 2 hrs after meals: < 160,   No low sugars: nothing < 70    F.u in 6 weeks for blood sugar check   Dr. Adrian Blackwater

## 2016-01-11 LAB — FSH/LH
FSH: 58.6 m[IU]/mL
LH: 7.2 m[IU]/mL

## 2016-01-14 NOTE — Assessment & Plan Note (Signed)
A: uncontrolled with rise in A1c P: Titrate up on novolog Reduce sugar in diet

## 2016-01-14 NOTE — Assessment & Plan Note (Signed)
Painful neuropathy in uncontrolled diabetes  Plan: Tylenol #4 Cymbalta Flexeril

## 2016-01-14 NOTE — Assessment & Plan Note (Signed)
Declined with insomnia Increase trazodone

## 2016-01-16 ENCOUNTER — Telehealth: Payer: Self-pay

## 2016-01-16 DIAGNOSIS — G4700 Insomnia, unspecified: Secondary | ICD-10-CM

## 2016-01-16 NOTE — Telephone Encounter (Signed)
Pt was called and informed of lab results. Pt also states that on last OV you increased her Trazodone pt states that she took medication and woke up Sunday morning and eyes were closed shut, and she had a rash on her chest, abdomen, and back.

## 2016-01-17 MED ORDER — ZOLPIDEM TARTRATE 5 MG PO TABS: 5.0000 mg | ORAL_TABLET | Freq: Every evening | ORAL | 0 refills | Status: DC | PRN

## 2016-01-17 NOTE — Addendum Note (Signed)
Addended by: Boykin Nearing on: 01/17/2016 01:16 PM   Modules accepted: Orders

## 2016-01-17 NOTE — Telephone Encounter (Signed)
Please ask patient to stop trazodone due to rash If rash has not resolved she is to take benadryl 50 mg every 8 hrs as needed She can pick up Rx for Ambien for insomnia

## 2016-01-21 ENCOUNTER — Telehealth: Payer: Self-pay

## 2016-01-21 NOTE — Telephone Encounter (Signed)
Pt was called and informed to stop taking trazodone and to take benadryl for rash. Pt was also informed of Ambien being ready for pick up.

## 2016-02-09 ENCOUNTER — Other Ambulatory Visit: Payer: Self-pay | Admitting: Family Medicine

## 2016-02-09 ENCOUNTER — Other Ambulatory Visit: Payer: Self-pay | Admitting: Endocrinology

## 2016-02-09 DIAGNOSIS — F32A Depression, unspecified: Secondary | ICD-10-CM

## 2016-02-09 DIAGNOSIS — F419 Anxiety disorder, unspecified: Principal | ICD-10-CM

## 2016-02-09 DIAGNOSIS — F329 Major depressive disorder, single episode, unspecified: Secondary | ICD-10-CM

## 2016-02-09 DIAGNOSIS — E1140 Type 2 diabetes mellitus with diabetic neuropathy, unspecified: Secondary | ICD-10-CM

## 2016-02-12 NOTE — Telephone Encounter (Signed)
Please inform patient Xanax ready for pick up

## 2016-03-13 ENCOUNTER — Other Ambulatory Visit: Payer: Self-pay | Admitting: Family Medicine

## 2016-03-13 DIAGNOSIS — E1140 Type 2 diabetes mellitus with diabetic neuropathy, unspecified: Secondary | ICD-10-CM

## 2016-04-30 ENCOUNTER — Other Ambulatory Visit: Payer: Self-pay | Admitting: Family Medicine

## 2016-04-30 DIAGNOSIS — G4700 Insomnia, unspecified: Secondary | ICD-10-CM

## 2016-05-06 NOTE — Telephone Encounter (Signed)
Please inform patient Ambien ready for pick up  

## 2016-05-07 NOTE — Telephone Encounter (Signed)
Pt was called and informed of script being ready for pick up.

## 2016-05-27 ENCOUNTER — Telehealth: Payer: Self-pay | Admitting: Family Medicine

## 2016-05-27 DIAGNOSIS — R197 Diarrhea, unspecified: Principal | ICD-10-CM

## 2016-05-27 DIAGNOSIS — R112 Nausea with vomiting, unspecified: Secondary | ICD-10-CM

## 2016-05-27 NOTE — Telephone Encounter (Signed)
Patient doesn't have diagnosis of migraines that I see. Will forward request to covering physician, Dr. Jarold Song to determine appropriate plan.

## 2016-05-27 NOTE — Telephone Encounter (Signed)
Pt calling to request phenergan due to migraines and would like to know if she can get a refill on those as well as a script for migraines. Please f/u with pt.

## 2016-05-28 ENCOUNTER — Other Ambulatory Visit: Payer: Self-pay | Admitting: Endocrinology

## 2016-05-28 ENCOUNTER — Other Ambulatory Visit: Payer: Self-pay | Admitting: Family Medicine

## 2016-05-28 DIAGNOSIS — E1140 Type 2 diabetes mellitus with diabetic neuropathy, unspecified: Secondary | ICD-10-CM

## 2016-05-28 MED ORDER — PROMETHAZINE HCL 25 MG PO TABS: 25.0000 mg | ORAL_TABLET | Freq: Three times a day (TID) | ORAL | 0 refills | Status: DC | PRN

## 2016-05-28 NOTE — Telephone Encounter (Signed)
Refuse 

## 2016-05-28 NOTE — Telephone Encounter (Signed)
Last ov 08/01/15 2 no-shows No future scheduled- ok to refill please advise

## 2016-05-28 NOTE — Telephone Encounter (Signed)
I have sent a prescription for Phenergan to her pharmacy but she will need to use an OTC medication for headache and schedule an appointment with her PCP to be evaluated for migraines as this is not documented in her chart.

## 2016-05-29 NOTE — Telephone Encounter (Signed)
Pt was called and informed of medication being sent pt pharmacy. Pt was also informed to make a OV to follow up for headaches.

## 2016-06-12 ENCOUNTER — Telehealth: Payer: Self-pay | Admitting: Family Medicine

## 2016-06-12 NOTE — Telephone Encounter (Signed)
Will route to PCP 

## 2016-06-12 NOTE — Telephone Encounter (Signed)
Pt was called and a VM was left informing pt that script is ready for pick up. 

## 2016-06-12 NOTE — Telephone Encounter (Signed)
Ambien refilled last month with @ refills Did patient pick up Rx? Please check med book

## 2016-06-12 NOTE — Telephone Encounter (Signed)
Pt called stating that the [facility] is saying that they did not get the script for Ambien and she would like to know if ot can sent in today. Requests the nurse call her when it has been sent. Thank you.

## 2016-06-20 ENCOUNTER — Telehealth: Payer: Self-pay | Admitting: Family Medicine

## 2016-06-20 DIAGNOSIS — E1140 Type 2 diabetes mellitus with diabetic neuropathy, unspecified: Secondary | ICD-10-CM

## 2016-06-20 MED ORDER — GABAPENTIN 300 MG PO CAPS: 300.0000 mg | ORAL_CAPSULE | Freq: Three times a day (TID) | ORAL | 3 refills | Status: DC

## 2016-06-20 NOTE — Telephone Encounter (Signed)
Sent in 300 mg gabapentin

## 2016-06-20 NOTE — Telephone Encounter (Signed)
Patient states [facility] has a Rx of her gabapentin for 100 MG but she is prescribed to take 300 MG

## 2016-06-20 NOTE — Telephone Encounter (Signed)
Contacted pt to make her aware the rx sent to [facility] was the gabapentin 100mg  rx.  pt is reuqesting a refill. Pt states she would like like it sent [facility]. Pt states the 100mg  is not the correct dosage cause Dr. Adrian Blackwater switched her to 300mg  tid

## 2016-06-26 ENCOUNTER — Ambulatory Visit: Payer: Medicaid Other | Attending: Family Medicine | Admitting: Physician Assistant

## 2016-06-26 ENCOUNTER — Encounter: Payer: Self-pay | Admitting: Physician Assistant

## 2016-06-26 VITALS — BP 188/93 | HR 76 | Temp 97.9°F | Resp 16 | Wt 229.0 lb

## 2016-06-26 DIAGNOSIS — R112 Nausea with vomiting, unspecified: Secondary | ICD-10-CM

## 2016-06-26 DIAGNOSIS — Z79899 Other long term (current) drug therapy: Secondary | ICD-10-CM | POA: Insufficient documentation

## 2016-06-26 DIAGNOSIS — E1165 Type 2 diabetes mellitus with hyperglycemia: Secondary | ICD-10-CM | POA: Insufficient documentation

## 2016-06-26 DIAGNOSIS — I10 Essential (primary) hypertension: Secondary | ICD-10-CM | POA: Diagnosis not present

## 2016-06-26 DIAGNOSIS — Z111 Encounter for screening for respiratory tuberculosis: Secondary | ICD-10-CM | POA: Diagnosis not present

## 2016-06-26 DIAGNOSIS — R51 Headache: Secondary | ICD-10-CM | POA: Diagnosis not present

## 2016-06-26 DIAGNOSIS — E1140 Type 2 diabetes mellitus with diabetic neuropathy, unspecified: Secondary | ICD-10-CM | POA: Diagnosis not present

## 2016-06-26 DIAGNOSIS — R0981 Nasal congestion: Secondary | ICD-10-CM | POA: Diagnosis not present

## 2016-06-26 DIAGNOSIS — Z794 Long term (current) use of insulin: Secondary | ICD-10-CM | POA: Insufficient documentation

## 2016-06-26 DIAGNOSIS — E118 Type 2 diabetes mellitus with unspecified complications: Secondary | ICD-10-CM

## 2016-06-26 DIAGNOSIS — R519 Headache, unspecified: Secondary | ICD-10-CM

## 2016-06-26 DIAGNOSIS — IMO0002 Reserved for concepts with insufficient information to code with codable children: Secondary | ICD-10-CM

## 2016-06-26 DIAGNOSIS — R197 Diarrhea, unspecified: Secondary | ICD-10-CM

## 2016-06-26 LAB — POCT URINALYSIS DIPSTICK
Bilirubin, UA: NEGATIVE
Glucose, UA: 500
Ketones, UA: NEGATIVE
Leukocytes, UA: NEGATIVE
Nitrite, UA: NEGATIVE
Protein, UA: 300
Spec Grav, UA: 1.02 (ref 1.010–1.025)
Urobilinogen, UA: 0.2 E.U./dL
pH, UA: 5.5 (ref 5.0–8.0)

## 2016-06-26 LAB — GLUCOSE, POCT (MANUAL RESULT ENTRY)
POC Glucose: 350 mg/dl — AB (ref 70–99)
POC Glucose: 366 mg/dl — AB (ref 70–99)

## 2016-06-26 LAB — POCT GLYCOSYLATED HEMOGLOBIN (HGB A1C): Hemoglobin A1C: 13

## 2016-06-26 MED ORDER — FLUTICASONE PROPIONATE 50 MCG/ACT NA SUSP: 2.0000 | Freq: Every day | NASAL | 6 refills | Status: DC

## 2016-06-26 MED ORDER — PROMETHAZINE HCL 25 MG PO TABS: 25.0000 mg | ORAL_TABLET | Freq: Three times a day (TID) | ORAL | 0 refills | Status: DC | PRN

## 2016-06-26 MED ORDER — NAPROXEN 500 MG PO TABS: 500.0000 mg | ORAL_TABLET | Freq: Two times a day (BID) | ORAL | 0 refills | Status: DC

## 2016-06-26 MED ORDER — INSULIN ASPART 100 UNIT/ML ~~LOC~~ SOLN
10.0000 [IU] | Freq: Once | SUBCUTANEOUS | Status: AC
Start: 2016-06-26 — End: 2016-06-26
  Administered 2016-06-26: 10 [IU] via SUBCUTANEOUS

## 2016-06-26 NOTE — Patient Instructions (Signed)
Check blood sugars 3-4 times daily and record and bring to next visit.

## 2016-06-26 NOTE — Progress Notes (Signed)
[name], is a 51 y.o. female  ZOX:[id number]  [id number]:[id number]  DOB - 1965/10/30  Subjective:  Chief Complaint and HPI: [name] is a 51 y.o. female here today for 1 month h/o headaches that are a new type HA for her.  The HA begins with an aura/floaters/scotoma in her R eye.  This lasts for several minutes then she develops N/V and a moderate to severe R sided HA.  The headaches lasts anyhwere from 1 to 3 hrs.  Tylenol and phenergan help some.  She has never had this type HA before. The headaches have been occurring almost daily for about 1 month.  + FH migraines, No FH aneurysm/sudden death.  She has noticed some allergy symptoms and nasal congestion.    She says she is compliant with her BP meds.  She also says she is compliant with her diabetes medications.  She says she checks her blood sugars 3 times daily and that usu they are under 200.  She does not have any readings with her today.  She denies s/sx associated with either her uncontrolled BP and diabetes.   ROS:   Constitutional:  No f/c, No night sweats, No unexplained weight loss. EENT:  +fleeting vision changes just prior to HA, No blurry vision, No hearing changes. No mouth, throat, or ear problems.  Respiratory: No cough, No SOB Cardiac: No CP, no palpitations GI:  No abd pain, No N/V/D. GU: No Urinary s/sx Musculoskeletal: No joint pain Neuro: No headache, no dizziness, no motor weakness.  Skin: No rash Endocrine:  No polydipsia. No polyuria.  Psych: Denies SI/HI  No problems updated.  ALLERGIES: Allergies  Allergen Reactions  . Latex Rash    PAST MEDICAL HISTORY: Past Medical History:  Diagnosis Date  . Anxiety 2002  . Chest tightness   . Depression 2001  . Diabetes type 1, uncontrolled ([place])    at age 44  . Diabetic neuropathy ([place])   . Essential hypertension 2015  . GERD (gastroesophageal reflux disease)    about age of 87  . Nausea and vomiting in adult   . Urinary frequency   . Yeast  vaginitis     MEDICATIONS AT HOME: Prior to Admission medications   Medication Sig Start Date End Date Taking? Authorizing Provider  acetaminophen-codeine (TYLENOL #4) 300-60 MG tablet Take 1 tablet by mouth every 8 (eight) hours as needed for moderate pain. 01/10/16   Josalyn Funches, MD  Albiglutide (TANZEUM) 30 MG PEN Inject contents of pen weekly 08/01/15   Elayne Snare, MD  ALPRAZolam Duanne Moron) 0.5 MG tablet TAKE ONE TABLET BY MOUTH AT BEDTIME AS NEEDED FOR ANXIETY OR SLEEP 02/12/16   Boykin Nearing, MD  Blood Glucose Monitoring Suppl (ACCU-CHEK AVIVA PLUS) W/DEVICE KIT CHECK BLOOD SUGARS THREE TIMES A DAY.  DX:250.03 07/04/13   Elayne Snare, MD  calcium citrate-vitamin D 500-400 MG-UNIT chewable tablet Chew 1 tablet by mouth 2 (two) times daily. 05/09/14   Josalyn Funches, MD  Cholecalciferol ([employer] VIT D 5000 HIGH-POTENCY) 5000 units capsule Take 5,000 Units by mouth daily.    Historical Provider, MD  cyclobenzaprine (FLEXERIL) 10 MG tablet Take 1 tablet (10 mg total) by mouth 3 (three) times daily as needed for muscle spasms. 01/10/16   Josalyn Funches, MD  diphenoxylate-atropine (LOMOTIL) 2.5-0.025 MG tablet Take 2 tablets by mouth 3 (three) times daily before meals. Please take thirty (30) minutes prior to meal. 07/12/15   Josalyn Funches, MD  DULoxetine (CYMBALTA) 60 MG capsule Take 1 capsule (  60 mg total) by mouth daily. 01/10/16   Josalyn Funches, MD  DULoxetine (CYMBALTA) 60 MG capsule TAKE ONE CAPSULE BY MOUTH ONCE DAILY 05/28/16   Boykin Nearing, MD  fluticasone (FLONASE) 50 MCG/ACT nasal spray Place 2 sprays into both nostrils daily. 06/26/16   Argentina Donovan, PA-C  gabapentin (NEURONTIN) 300 MG capsule Take 1 capsule (300 mg total) by mouth 3 (three) times daily. 06/20/16   Josalyn Funches, MD  glucose blood (ACCU-CHEK AVIVA PLUS) test strip USE TO CHECK BLOOD SUGAR BEFORE MEALS AND AT BEDTIME dx code E10.65 12/29/14   Josalyn Funches, MD  insulin aspart (NOVOLOG FLEXPEN) 100 UNIT/ML FlexPen  Inject 15 Units into the skin 3 (three) times daily with meals. 01/10/16   Josalyn Funches, MD  LANTUS SOLOSTAR 100 UNIT/ML Solostar Pen INJECT 50 UNITS SUBCUTANEOUSLY ONCE DAILY 01/04/16   Elayne Snare, MD  lisinopril (PRINIVIL,ZESTRIL) 40 MG tablet Take 1 tablet (40 mg total) by mouth daily. 03/19/15   Josalyn Funches, MD  naproxen (NAPROSYN) 500 MG tablet Take 1 tablet (500 mg total) by mouth 2 (two) times daily with a meal. Prn Headaches or pain 06/26/16   Argentina Donovan, PA-C  pregabalin (LYRICA) 50 MG capsule Take 1 capsule (50 mg total) by mouth 3 (three) times daily. 08/01/15   Elayne Snare, MD  promethazine (PHENERGAN) 25 MG tablet Take 1 tablet (25 mg total) by mouth every 8 (eight) hours as needed for nausea or vomiting. 06/26/16   Argentina Donovan, PA-C  zolpidem (AMBIEN) 5 MG tablet TAKE ONE TABLET BY MOUTH ONCE DAILY AT BEDTIME AS NEEDED FOR SLEEP 05/06/16   Josalyn Funches, MD     Objective:  EXAM:   Vitals:   06/26/16 1202  BP: (!) 188/93  Pulse: 76  Resp: 16  Temp: 97.9 F (36.6 C)  TempSrc: Oral  SpO2: 96%  Weight: 229 lb (103.9 kg)    General appearance : A&OX3. NAD. Non-toxic-appearing HEENT: Atraumatic and Normocephalic.  PERRLA. EOM intact.  TM clear B. Mouth-MMM, post pharynx WNL w/o erythema, No PND. + nasal turbinates congested, pale and boggy Neck: supple, no JVD. No cervical lymphadenopathy. No thyromegaly Chest/Lungs:  Breathing-non-labored, Good air entry bilaterally, breath sounds normal without rales, rhonchi, or wheezing  CVS: S1 S2 regular, no murmurs, gallops, rubs  Extremities: Bilateral Lower Ext shows no edema, both legs are warm to touch with = pulse throughout Neurology:  CN II-XII grossly intact, Non focal.  Cerebellar function intact.  Normal heel to shin/finger to nose.  Normal facial symmetry and U&Lext symmetry.  Gait is normal.  Psych:  TP linear. J/I WNL. Normal speech. Appropriate eye contact and affect.  Skin:  No Rash  Data Review Lab  Results  Component Value Date   HGBA1C 13.0 06/26/2016   HGBA1C 13.8 01/10/2016   HGBA1C 8.7 (H) 06/14/2015     Assessment & Plan   1. Uncontrolled type 2 diabetes mellitus with complication, without long-term current use of insulin (HCC) Uncontrolled.  I am unsure about patient compliance and reliability in that she says her blood sugars at home are under 200 yet her A1C is 13.0 today.  Check blood sugars 3-4 times daily and record and bring to next visit.  For now, continue current medication regimen - Glucose (CBG) - HgB A1c - POCT urinalysis dipstick - insulin aspart (novoLOG) injection 10 Units; Inject 0.1 mLs (10 Units total) into the skin once. - Comprehensive metabolic panel - POCT glucose (manual entry)  2. PPD screening test  This should not have been ordered and placed today(Thursday).  Repeat in 2-3 weeks if she needs this done.   - PPD  3. Headache around the eyes Neurological exam normal without any abnormal findings or red flags. New type HA-onset about 1 month ago.  ?new onset migraine.  She does have a family history.  - CT Head Wo Contrast; Future - naproxen (NAPROSYN) 500 MG tablet; Take 1 tablet (500 mg total) by mouth 2 (two) times daily with a meal. Prn Headaches or pain  Dispense: 60 tablet; Refill: 0 - promethazine (PHENERGAN) 25 MG tablet; Take 1 tablet (25 mg total) by mouth every 8 (eight) hours as needed for nausea or vomiting.  Dispense: 20 tablet; Refill: 0 I have instructed her to call 911 if these worsen/neurological signs develop.    4. Nausea vomiting associated with new HA - promethazine (PHENERGAN) 25 MG tablet; Take 1 tablet (25 mg total) by mouth every 8 (eight) hours as needed for nausea or vomiting.  Dispense: 20 tablet; Refill: 0  5. Nasal congestion This may be causing/contributing to her headaches - fluticasone (FLONASE) 50 MCG/ACT nasal spray; Place 2 sprays into both nostrils daily.  Dispense: 16 g; Refill: 6  6. Htn also uncontrolled.   This may be a contributing factor to her HA.  I am unsure whether she is compliant with her medications.  Check BP OOO and record and bring to next visit.  Continue current regimen for now.     Patient have been counseled extensively about nutrition and exercise  Return in about 3 weeks (around 07/17/2016) for Dr Adrian Blackwater for diabetes and recheck headaches.  The patient was given clear instructions to go to ER or return to medical center if symptoms don't improve, worsen or new problems develop. The patient verbalized understanding. The patient was told to call to get lab results if they haven't heard anything in the next week.     Freeman Caldron, PA-C [facility] [place], [place]   06/26/2016, 12:20 PMPatient ID: [name], female   DOB: [birth year], 51 y.o.   MRN: [id number]

## 2016-06-27 LAB — COMPREHENSIVE METABOLIC PANEL
ALT: 11 IU/L (ref 0–32)
AST: 8 IU/L (ref 0–40)
Albumin/Globulin Ratio: 1.1 — ABNORMAL LOW (ref 1.2–2.2)
Albumin: 3.5 g/dL (ref 3.5–5.5)
Alkaline Phosphatase: 116 IU/L (ref 39–117)
BUN/Creatinine Ratio: 14 (ref 9–23)
BUN: 23 mg/dL (ref 6–24)
Bilirubin Total: <0.2 mg/dL (ref 0.0–1.2)
CO2: 24 mmol/L (ref 18–29)
Calcium: 8.9 mg/dL (ref 8.7–10.2)
Chloride: 95 mmol/L — ABNORMAL LOW (ref 96–106)
Creatinine, Ser: 1.66 mg/dL — ABNORMAL HIGH (ref 0.57–1.00)
GFR calc Af Amer: 41 mL/min/{1.73_m2} — ABNORMAL LOW (ref >59)
GFR calc non Af Amer: 36 mL/min/{1.73_m2} — ABNORMAL LOW (ref >59)
Globulin, Total: 3.3 g/dL (ref 1.5–4.5)
Glucose: 346 mg/dL — ABNORMAL HIGH (ref 65–99)
Potassium: 4.6 mmol/L (ref 3.5–5.2)
Sodium: 134 mmol/L (ref 134–144)
Total Protein: 6.8 g/dL (ref 6.0–8.5)

## 2016-06-30 ENCOUNTER — Telehealth: Payer: Self-pay | Admitting: Family Medicine

## 2016-06-30 ENCOUNTER — Other Ambulatory Visit: Payer: Self-pay | Admitting: Family Medicine

## 2016-06-30 DIAGNOSIS — I10 Essential (primary) hypertension: Secondary | ICD-10-CM

## 2016-06-30 DIAGNOSIS — E119 Type 2 diabetes mellitus without complications: Secondary | ICD-10-CM | POA: Diagnosis not present

## 2016-06-30 DIAGNOSIS — Z794 Long term (current) use of insulin: Principal | ICD-10-CM

## 2016-06-30 DIAGNOSIS — E1140 Type 2 diabetes mellitus with diabetic neuropathy, unspecified: Secondary | ICD-10-CM

## 2016-06-30 DIAGNOSIS — IMO0002 Reserved for concepts with insufficient information to code with codable children: Secondary | ICD-10-CM

## 2016-06-30 DIAGNOSIS — E1165 Type 2 diabetes mellitus with hyperglycemia: Secondary | ICD-10-CM

## 2016-06-30 DIAGNOSIS — E118 Type 2 diabetes mellitus with unspecified complications: Principal | ICD-10-CM

## 2016-06-30 NOTE — Telephone Encounter (Signed)
Pt calling stating that she went to see a physician for weight loss and he wants to prescribe a medication but pt's bp is elevated and he needs approval from Dr Adrian Blackwater before he writes the script. Script is for Phenermin and pt wants to know if Dr Adrian Blackwater is willing to write the script. Also would like to know if her script for Gabapentin can be changed for another med.Please f/u.

## 2016-07-01 ENCOUNTER — Telehealth: Payer: Self-pay | Admitting: Family Medicine

## 2016-07-01 NOTE — Telephone Encounter (Signed)
Patient returned nurse's call regarding her lab results.   Thank you.

## 2016-07-02 NOTE — Telephone Encounter (Signed)
Can you give her results.

## 2016-07-02 NOTE — Telephone Encounter (Signed)
Patient called back requesting lab results, pt is concerned. Pt would like medication refill on  DULoxetine (CYMBALTA) 60 MG capsule

## 2016-07-03 ENCOUNTER — Telehealth: Payer: Self-pay | Admitting: Family Medicine

## 2016-07-03 MED ORDER — PREGABALIN 50 MG PO CAPS
50.0000 mg | ORAL_CAPSULE | Freq: Three times a day (TID) | ORAL | 2 refills | Status: DC
Start: 2016-07-03 — End: 2016-09-01

## 2016-07-03 NOTE — Telephone Encounter (Signed)
Patient called the office to get lab results. I informed patient what nurse wrote  (Her labs show that her kidney function is declining.  It is very important that she check her blood pressures and blood sugars and keep a log of them, take all of her medications as directed, and follow up as planned.  We have to get her diabetes and Blood pressure under better control to get her kidneys healthier).  Patient understood but wanted to let provider know that she saw the kidney specialist yesterday and that they informed her that her kidney function is good. Better than the last time that they saw her.   Thank you.

## 2016-07-03 NOTE — Telephone Encounter (Signed)
Please call back to patient She has her HCTZ increased to 25 mg by her nephrologist on 06/30/2016  She is advised to monitor CBGs at home, if normal I can ok the script, but I do not write the script  Regarding, gabapentin, it can be changed to lyrica  She would stop the gabapentin an start lyrica Rx ready for pick up

## 2016-07-03 NOTE — Telephone Encounter (Signed)
Pt returned call gave results to [facility] to give to pt

## 2016-07-03 NOTE — Telephone Encounter (Signed)
Pt results will be given by [place].

## 2016-07-12 ENCOUNTER — Other Ambulatory Visit: Payer: Self-pay | Admitting: Family Medicine

## 2016-07-12 DIAGNOSIS — E1140 Type 2 diabetes mellitus with diabetic neuropathy, unspecified: Secondary | ICD-10-CM

## 2016-07-14 NOTE — Telephone Encounter (Signed)
Please inform patient of Tylenol 4 script being placed at the front desk for pickup.

## 2016-07-14 NOTE — Telephone Encounter (Signed)
Ready for pick up

## 2016-07-16 ENCOUNTER — Encounter: Payer: Self-pay | Admitting: Family Medicine

## 2016-07-25 ENCOUNTER — Encounter: Payer: Self-pay | Admitting: Family Medicine

## 2016-07-25 ENCOUNTER — Ambulatory Visit: Payer: Medicaid Other | Attending: Family Medicine | Admitting: Family Medicine

## 2016-07-25 VITALS — BP 181/75 | HR 74 | Temp 98.3°F | Wt 220.8 lb

## 2016-07-25 DIAGNOSIS — G43109 Migraine with aura, not intractable, without status migrainosus: Secondary | ICD-10-CM

## 2016-07-25 DIAGNOSIS — N183 Chronic kidney disease, stage 3 (moderate): Secondary | ICD-10-CM | POA: Insufficient documentation

## 2016-07-25 DIAGNOSIS — R51 Headache: Secondary | ICD-10-CM | POA: Insufficient documentation

## 2016-07-25 DIAGNOSIS — E1165 Type 2 diabetes mellitus with hyperglycemia: Secondary | ICD-10-CM | POA: Diagnosis not present

## 2016-07-25 DIAGNOSIS — E1122 Type 2 diabetes mellitus with diabetic chronic kidney disease: Secondary | ICD-10-CM | POA: Diagnosis not present

## 2016-07-25 DIAGNOSIS — I129 Hypertensive chronic kidney disease with stage 1 through stage 4 chronic kidney disease, or unspecified chronic kidney disease: Secondary | ICD-10-CM | POA: Insufficient documentation

## 2016-07-25 DIAGNOSIS — F329 Major depressive disorder, single episode, unspecified: Secondary | ICD-10-CM | POA: Diagnosis not present

## 2016-07-25 DIAGNOSIS — E1140 Type 2 diabetes mellitus with diabetic neuropathy, unspecified: Secondary | ICD-10-CM

## 2016-07-25 DIAGNOSIS — Z794 Long term (current) use of insulin: Secondary | ICD-10-CM | POA: Diagnosis not present

## 2016-07-25 DIAGNOSIS — F17210 Nicotine dependence, cigarettes, uncomplicated: Secondary | ICD-10-CM | POA: Diagnosis not present

## 2016-07-25 DIAGNOSIS — I10 Essential (primary) hypertension: Secondary | ICD-10-CM

## 2016-07-25 DIAGNOSIS — E118 Type 2 diabetes mellitus with unspecified complications: Secondary | ICD-10-CM | POA: Diagnosis not present

## 2016-07-25 DIAGNOSIS — IMO0002 Reserved for concepts with insufficient information to code with codable children: Secondary | ICD-10-CM

## 2016-07-25 LAB — GLUCOSE, POCT (MANUAL RESULT ENTRY): POC Glucose: 183 mg/dl — AB (ref 70–99)

## 2016-07-25 MED ORDER — SUMATRIPTAN SUCCINATE 50 MG PO TABS: 50.0000 mg | ORAL_TABLET | ORAL | 0 refills | Status: DC | PRN

## 2016-07-25 MED ORDER — INSULIN ASPART 100 UNIT/ML FLEXPEN: 10.0000 [IU] | PEN_INJECTOR | Freq: Three times a day (TID) | SUBCUTANEOUS | 2 refills | Status: DC

## 2016-07-25 MED ORDER — AMLODIPINE BESYLATE 5 MG PO TABS: 5.0000 mg | ORAL_TABLET | Freq: Every day | ORAL | 2 refills | Status: DC

## 2016-07-25 MED ORDER — INSULIN ASPART 100 UNIT/ML FLEXPEN: 10.0000 [IU] | PEN_INJECTOR | Freq: Three times a day (TID) | SUBCUTANEOUS | 0 refills | Status: DC

## 2016-07-25 MED ORDER — NORTRIPTYLINE HCL 25 MG PO CAPS: 25.0000 mg | ORAL_CAPSULE | Freq: Every day | ORAL | 0 refills | Status: DC

## 2016-07-25 MED ORDER — SITAGLIPTIN PHOSPHATE 50 MG PO TABS: 50.0000 mg | ORAL_TABLET | Freq: Every day | ORAL | 2 refills | Status: DC

## 2016-07-25 NOTE — Patient Instructions (Addendum)
[name] was seen today for headache.  Diagnoses and all orders for this visit:  Uncontrolled type 2 diabetes mellitus with complication, without long-term current use of insulin (HCC) -     POCT glucose (manual entry) -     sitaGLIPtin (JANUVIA) 50 MG tablet; Take 1 tablet (50 mg total) by mouth daily.  Essential hypertension, benign -     amLODipine (NORVASC) 5 MG tablet; Take 1 tablet (5 mg total) by mouth daily.  Uncontrolled type 2 diabetes mellitus with complication, with long-term current use of insulin (HCC) -     Discontinue: insulin aspart (NOVOLOG FLEXPEN) 100 UNIT/ML FlexPen; Inject 10 Units into the skin 3 (three) times daily with meals. -     insulin aspart (NOVOLOG FLEXPEN) 100 UNIT/ML FlexPen; Inject 10 Units into the skin 3 (three) times daily with meals.  Migraine with aura and without status migrainosus, not intractable -     SUMAtriptan (IMITREX) 50 MG tablet; Take 1 tablet (50 mg total) by mouth every 2 (two) hours as needed for migraine. May repeat in 2 hours if headache persists or recurs. -     nortriptyline (PAMELOR) 25 MG capsule; Take 1 capsule (25 mg total) by mouth at bedtime. Take 25 mg at bedtime for one week, then 50 mg week two, 75 mg nightly starting week 3  Diabetic neuropathy, painful (HCC) -     nortriptyline (PAMELOR) 25 MG capsule; Take 1 capsule (25 mg total) by mouth at bedtime. Take 25 mg at bedtime for one week, then 50 mg week two, 75 mg nightly starting week 3   Take phenergan with Imitrex for migraine, may repeat Imitrex in 2 hrs of needed Take no more than 2 doses in one week period.   F/u in 2 weeks for BP check with clinical pharmacologist  F/u with me in 4 weeks for migraines and neuropathy   Dr. Adrian Blackwater

## 2016-07-25 NOTE — Progress Notes (Signed)
Subjective:  Patient ID: [name], female    DOB: December 18, 1965  Age: 51 y.o. MRN: [id number]  CC: Headache   HPI [name] has HTN, diabetes, PAD s/p partial amputation of the L foot, CKD stage 3, anxiety and depression she  presents for   1. New onset headache: still having pain in R eye. Last weekend she started seeing lines in her L viual field. Her mother had migraines. She has nausea with headaches. Headaches last 45 minutes to 4 hrs. Occurs 5 times per week. Resting and closing eyes improves headache.  No fever, chills, head trauma.   2. HTN: she is compliant with lisinopril 40 mg and HCTZ 25 mg. She denies chest pain and shortness of breath. She denies leg swelling.   3. CHRONIC DIABETES  Disease Monitoring  Blood Sugar Ranges:   Fasting:   Post prandial: 159  Polyuria: no   Visual problems: yes   Medication Compliance: yes, takes extra novolog at times up to 15 U with meals.  Medication Side Effects  Hypoglycemia: yes, 60, symptoms off low sugar over last week 6 times.    Social History  Substance Use Topics  . Smoking status: Light Tobacco Smoker    Packs/day: 0.50    Years: 22.00    Types: Cigarettes  . Smokeless tobacco: Never Used  . Alcohol use 0.0 oz/week     Comment: sociial    Outpatient Medications Prior to Visit  Medication Sig Dispense Refill  . acetaminophen-codeine (TYLENOL #4) 300-60 MG tablet TAKE ONE TABLET BY MOUTH EVERY 8 HOURS AS NEEDED MOD  PAIN 90 tablet 0  . Albiglutide (TANZEUM) 30 MG PEN Inject contents of pen weekly 4 each 1  . ALPRAZolam (XANAX) 0.5 MG tablet TAKE ONE TABLET BY MOUTH AT BEDTIME AS NEEDED FOR ANXIETY OR SLEEP 30 tablet 2  . Blood Glucose Monitoring Suppl (ACCU-CHEK AVIVA PLUS) W/DEVICE KIT CHECK BLOOD SUGARS THREE TIMES A DAY.  DX:250.03 1 kit 0  . calcium citrate-vitamin D 500-400 MG-UNIT chewable tablet Chew 1 tablet by mouth 2 (two) times daily. 180 tablet 1  . Cholecalciferol (CVS VIT D 5000  HIGH-POTENCY) 5000 units capsule Take 5,000 Units by mouth daily.    . cyclobenzaprine (FLEXERIL) 10 MG tablet Take 1 tablet (10 mg total) by mouth 3 (three) times daily as needed for muscle spasms. 60 tablet 3  . diphenoxylate-atropine (LOMOTIL) 2.5-0.025 MG tablet Take 2 tablets by mouth 3 (three) times daily before meals. Please take thirty (30) minutes prior to meal. 180 tablet 2  . DULoxetine (CYMBALTA) 60 MG capsule Take 1 capsule (60 mg total) by mouth daily. 30 capsule 5  . DULoxetine (CYMBALTA) 60 MG capsule TAKE ONE CAPSULE BY MOUTH ONCE DAILY 30 capsule 0  . fluticasone (FLONASE) 50 MCG/ACT nasal spray Place 2 sprays into both nostrils daily. 16 g 6  . glucose blood (ACCU-CHEK AVIVA PLUS) test strip USE TO CHECK BLOOD SUGAR BEFORE MEALS AND AT BEDTIME dx code E10.65 125 each 3  . hydrochlorothiazide (HYDRODIURIL) 25 MG tablet Take 25 mg by mouth daily.    Marland Kitchen LANTUS SOLOSTAR 100 UNIT/ML Solostar Pen INJECT 50 UNITS SUBCUTANEOUSLY ONCE DAILY 15 mL 3  . lisinopril (PRINIVIL,ZESTRIL) 40 MG tablet TAKE ONE TABLET BY MOUTH ONCE DAILY 30 tablet 0  . naproxen (NAPROSYN) 500 MG tablet Take 1 tablet (500 mg total) by mouth 2 (two) times daily with a meal. Prn Headaches or pain 60 tablet 0  . NOVOLOG FLEXPEN 100  UNIT/ML FlexPen INJECT 15 UNITS INTO THE SKIN 3 TIMES DAILY WITH MEALS 15 mL 0  . pregabalin (LYRICA) 50 MG capsule Take 1 capsule (50 mg total) by mouth 3 (three) times daily. 270 capsule 3  . pregabalin (LYRICA) 50 MG capsule Take 1 capsule (50 mg total) by mouth 3 (three) times daily. 90 capsule 2  . promethazine (PHENERGAN) 25 MG tablet Take 1 tablet (25 mg total) by mouth every 8 (eight) hours as needed for nausea or vomiting. 20 tablet 0  . zolpidem (AMBIEN) 5 MG tablet TAKE ONE TABLET BY MOUTH ONCE DAILY AT BEDTIME AS NEEDED FOR SLEEP 30 tablet 2   No facility-administered medications prior to visit.     ROS Review of Systems  Constitutional: Negative for chills and fever.  Eyes:  Positive for visual disturbance.  Respiratory: Negative for shortness of breath.   Cardiovascular: Negative for chest pain.  Gastrointestinal: Positive for nausea. Negative for abdominal pain and blood in stool.  Musculoskeletal: Negative for arthralgias and back pain.  Skin: Negative for rash.  Allergic/Immunologic: Negative for immunocompromised state.  Neurological: Positive for headaches.  Hematological: Negative for adenopathy. Does not bruise/bleed easily.  Psychiatric/Behavioral: Negative for dysphoric mood and suicidal ideas.    Objective:  BP (!) 181/75   Pulse 74   Temp 98.3 F (36.8 C) (Oral)   Wt 220 lb 12.8 oz (100.2 kg)   SpO2 95%   BMI 31.68 kg/m   BP/Weight 07/25/2016 06/26/2016 16/03/958  Systolic BP 454 098 119  Diastolic BP 75 93 74  Wt. (Lbs) 220.8 229 220.8  BMI 31.68 32.86 31.68    Physical Exam  Constitutional: She is oriented to person, place, and time. She appears well-developed and well-nourished. No distress.  HENT:  Head: Normocephalic and atraumatic.  Right Ear: Tympanic membrane, external ear and ear canal normal.  Left Ear: Tympanic membrane, external ear and ear canal normal.  Nose: Nose normal.  Mouth/Throat: Oropharynx is clear and moist.  Eyes: Conjunctivae and EOM are normal. Pupils are equal, round, and reactive to light.  Cardiovascular: Normal rate, regular rhythm, normal heart sounds and intact distal pulses.   Pulmonary/Chest: Effort normal and breath sounds normal.  Musculoskeletal: She exhibits no edema.  Neurological: She is alert and oriented to person, place, and time.  Skin: Skin is warm and dry. No rash noted.  Psychiatric: She has a normal mood and affect.    Lab Results  Component Value Date   HGBA1C 13.0 06/26/2016   CBG 183  Assessment & Plan:   [name] was seen today for headache.  Diagnoses and all orders for this visit:  Uncontrolled type 2 diabetes mellitus with complication, without long-term current use  of insulin (HCC) -     POCT glucose (manual entry) -     sitaGLIPtin (JANUVIA) 50 MG tablet; Take 1 tablet (50 mg total) by mouth daily.  Essential hypertension, benign -     amLODipine (NORVASC) 5 MG tablet; Take 1 tablet (5 mg total) by mouth daily.  Uncontrolled type 2 diabetes mellitus with complication, with long-term current use of insulin (HCC) -     Discontinue: insulin aspart (NOVOLOG FLEXPEN) 100 UNIT/ML FlexPen; Inject 10 Units into the skin 3 (three) times daily with meals. -     insulin aspart (NOVOLOG FLEXPEN) 100 UNIT/ML FlexPen; Inject 10 Units into the skin 3 (three) times daily with meals.  Migraine with aura and without status migrainosus, not intractable -     SUMAtriptan (IMITREX) 50  MG tablet; Take 1 tablet (50 mg total) by mouth every 2 (two) hours as needed for migraine. May repeat in 2 hours if headache persists or recurs. -     nortriptyline (PAMELOR) 25 MG capsule; Take 1 capsule (25 mg total) by mouth at bedtime. Take 25 mg at bedtime for one week, then 50 mg week two, 75 mg nightly starting week 3  Diabetic neuropathy, painful (HCC) -     nortriptyline (PAMELOR) 25 MG capsule; Take 1 capsule (25 mg total) by mouth at bedtime. Take 25 mg at bedtime for one week, then 50 mg week two, 75 mg nightly starting week 3     No orders of the defined types were placed in this encounter.   Follow-up: No Follow-up on file.   Boykin Nearing MD

## 2016-07-28 DIAGNOSIS — G43109 Migraine with aura, not intractable, without status migrainosus: Secondary | ICD-10-CM | POA: Insufficient documentation

## 2016-07-28 NOTE — Assessment & Plan Note (Addendum)
A: uncontrolled HTN Med: compliant P: Continue lisinopril 40 mg  HCTZ 25 mg  Add Norvasc 5 mg daily  F/u in 2 weeks and taper up to 10 mg if needed

## 2016-07-28 NOTE — Assessment & Plan Note (Signed)
A: uncontrolled with hypoglycemic episodes P: novolog 10 U TID lantus  50 U daily Add januvia 50 mg daily

## 2016-07-28 NOTE — Assessment & Plan Note (Signed)
A: migraine type headache in setting of uncontrolled HTN P: CT head Add imitrex to phenergan for abortive therapy Add pamelor with taper from 25 to 75 mg nightly for headache prophylaxis and diabetic neuropathy treatment

## 2016-07-29 ENCOUNTER — Telehealth: Payer: Self-pay | Admitting: Family Medicine

## 2016-07-29 NOTE — Telephone Encounter (Signed)
Caller requesting a pre-auth for pt. Please f/u with caller. Thank you.

## 2016-07-31 ENCOUNTER — Ambulatory Visit (HOSPITAL_COMMUNITY): Payer: Medicaid Other

## 2016-08-07 ENCOUNTER — Encounter: Payer: Medicaid Other | Admitting: Pharmacist

## 2016-08-07 NOTE — Progress Notes (Deleted)
   S:    Patient arrives in good spirits.    Presents to the clinic for hypertension evaluation. Patient was referred on 07/25/16.  Patient was last seen by Primary Care Provider on 07/25/16.   Patient reports adherence with medications.  Current BP Medications include:  Amlodipine 5 mg daily, lisinopril 40 mg daily, hydrochlorothiazide 25 mg daily  Dietary habits include:  .medreviewdc   O:   Last 3 Office BP readings: BP Readings from Last 3 Encounters:  07/25/16 (!) 181/75  06/26/16 (!) 188/93  01/10/16 (!) 147/74    BMET    Component Value Date/Time   NA 134 06/26/2016 1228   K 4.6 06/26/2016 1228   CL 95 (L) 06/26/2016 1228   CO2 24 06/26/2016 1228   GLUCOSE 346 (H) 06/26/2016 1228   GLUCOSE 174 (H) 01/10/2016 1528   BUN 23 06/26/2016 1228   CREATININE 1.66 (H) 06/26/2016 1228   CREATININE 1.92 (H) 01/10/2016 1528   CALCIUM 8.9 06/26/2016 1228   GFRNONAA 36 (L) 06/26/2016 1228   GFRNONAA 30 (L) 01/10/2016 1528   GFRAA 41 (L) 06/26/2016 1228   GFRAA 35 (L) 01/10/2016 1528    A/P: Hypertension longstanding/newly diagnosed currently *** on current medications.  {Meds adjust:18428} ***.   Results reviewed and written information provided.   Total time in face-to-face counseling *** minutes.   F/U Clinic Visit with Dr. Marland Kitchen  Patient seen with Maple Mirza, PharmD Candidate and Mechele Claude, PharmD Candidate

## 2016-08-11 ENCOUNTER — Ambulatory Visit (HOSPITAL_COMMUNITY): Payer: Medicaid Other | Attending: Physician Assistant

## 2016-08-13 NOTE — Telephone Encounter (Signed)
Attempted to call back to caller x 2, No answer.  Pre-authorization for what exactly? CT head ordered for headache?  Please attempt to call back and inquire tomorrow.

## 2016-08-27 ENCOUNTER — Other Ambulatory Visit: Payer: Self-pay | Admitting: Family Medicine

## 2016-08-27 DIAGNOSIS — K588 Other irritable bowel syndrome: Secondary | ICD-10-CM

## 2016-08-28 ENCOUNTER — Other Ambulatory Visit: Payer: Self-pay | Admitting: Pharmacist

## 2016-08-28 DIAGNOSIS — E1140 Type 2 diabetes mellitus with diabetic neuropathy, unspecified: Secondary | ICD-10-CM

## 2016-08-28 DIAGNOSIS — G43109 Migraine with aura, not intractable, without status migrainosus: Secondary | ICD-10-CM

## 2016-08-28 MED ORDER — NORTRIPTYLINE HCL 75 MG PO CAPS: 75.0000 mg | ORAL_CAPSULE | Freq: Every day | ORAL | 0 refills | Status: DC

## 2016-09-01 ENCOUNTER — Ambulatory Visit: Payer: Medicaid Other | Attending: Family Medicine | Admitting: Family Medicine

## 2016-09-01 ENCOUNTER — Encounter: Payer: Self-pay | Admitting: Family Medicine

## 2016-09-01 VITALS — BP 138/62 | HR 84 | Temp 98.1°F | Ht 70.0 in | Wt 235.4 lb

## 2016-09-01 DIAGNOSIS — K588 Other irritable bowel syndrome: Secondary | ICD-10-CM

## 2016-09-01 DIAGNOSIS — I129 Hypertensive chronic kidney disease with stage 1 through stage 4 chronic kidney disease, or unspecified chronic kidney disease: Secondary | ICD-10-CM | POA: Diagnosis not present

## 2016-09-01 DIAGNOSIS — N183 Chronic kidney disease, stage 3 unspecified: Secondary | ICD-10-CM

## 2016-09-01 DIAGNOSIS — Z89422 Acquired absence of other left toe(s): Secondary | ICD-10-CM | POA: Diagnosis not present

## 2016-09-01 DIAGNOSIS — E1140 Type 2 diabetes mellitus with diabetic neuropathy, unspecified: Secondary | ICD-10-CM | POA: Diagnosis not present

## 2016-09-01 DIAGNOSIS — R51 Headache: Secondary | ICD-10-CM | POA: Diagnosis present

## 2016-09-01 DIAGNOSIS — E1122 Type 2 diabetes mellitus with diabetic chronic kidney disease: Secondary | ICD-10-CM | POA: Diagnosis not present

## 2016-09-01 DIAGNOSIS — F329 Major depressive disorder, single episode, unspecified: Secondary | ICD-10-CM | POA: Diagnosis not present

## 2016-09-01 DIAGNOSIS — N393 Stress incontinence (female) (male): Secondary | ICD-10-CM

## 2016-09-01 DIAGNOSIS — F17210 Nicotine dependence, cigarettes, uncomplicated: Secondary | ICD-10-CM | POA: Diagnosis not present

## 2016-09-01 DIAGNOSIS — E1165 Type 2 diabetes mellitus with hyperglycemia: Secondary | ICD-10-CM | POA: Insufficient documentation

## 2016-09-01 DIAGNOSIS — I10 Essential (primary) hypertension: Secondary | ICD-10-CM

## 2016-09-01 DIAGNOSIS — R61 Generalized hyperhidrosis: Secondary | ICD-10-CM

## 2016-09-01 DIAGNOSIS — E118 Type 2 diabetes mellitus with unspecified complications: Secondary | ICD-10-CM

## 2016-09-01 DIAGNOSIS — K589 Irritable bowel syndrome without diarrhea: Secondary | ICD-10-CM | POA: Insufficient documentation

## 2016-09-01 DIAGNOSIS — R159 Full incontinence of feces: Secondary | ICD-10-CM | POA: Insufficient documentation

## 2016-09-01 DIAGNOSIS — Z794 Long term (current) use of insulin: Secondary | ICD-10-CM | POA: Diagnosis not present

## 2016-09-01 DIAGNOSIS — L74519 Primary focal hyperhidrosis, unspecified: Secondary | ICD-10-CM

## 2016-09-01 DIAGNOSIS — Z9889 Other specified postprocedural states: Secondary | ICD-10-CM | POA: Diagnosis not present

## 2016-09-01 DIAGNOSIS — IMO0002 Reserved for concepts with insufficient information to code with codable children: Secondary | ICD-10-CM

## 2016-09-01 DIAGNOSIS — Z79899 Other long term (current) drug therapy: Secondary | ICD-10-CM | POA: Insufficient documentation

## 2016-09-01 LAB — GLUCOSE, POCT (MANUAL RESULT ENTRY): POC Glucose: 162 mg/dl — AB (ref 70–99)

## 2016-09-01 LAB — POCT GLYCOSYLATED HEMOGLOBIN (HGB A1C): Hemoglobin A1C: 8.6

## 2016-09-01 MED ORDER — DIPHENOXYLATE-ATROPINE 2.5-0.025 MG PO TABS: 2.0000 | ORAL_TABLET | Freq: Three times a day (TID) | ORAL | 5 refills | Status: DC

## 2016-09-01 MED ORDER — AMLODIPINE BESYLATE 10 MG PO TABS: 10.0000 mg | ORAL_TABLET | Freq: Every day | ORAL | 5 refills | Status: DC

## 2016-09-01 MED ORDER — PREGABALIN 100 MG PO CAPS: 100.0000 mg | ORAL_CAPSULE | Freq: Three times a day (TID) | ORAL | 5 refills | Status: DC

## 2016-09-01 MED ORDER — ALUMINUM CHLORIDE 20 % EX SOLN: Freq: Every day | CUTANEOUS | 5 refills | Status: DC

## 2016-09-01 NOTE — Assessment & Plan Note (Signed)
A: improved  Med: compliant P: Continue novolog 10 U TID lantus 50 U daily Continue januvia 50 mg daily with plan to increase to 100 mg daily if GFR > 45 on repeat check

## 2016-09-01 NOTE — Progress Notes (Signed)
Subjective:  Patient ID: [name], female    DOB: 01/26/1966  Age: 51 y.o. MRN: [id number]  CC: Headache   HPI [name] has HTN, diabetes, PAD s/p partial amputation of the L foot, CKD stage 3, anxiety and depression she  presents for   1. New onset headache: improved. She has had 1 slight headache in the last 2 weeks.   2. HTN: she is compliant with lisinopril 40 mg, HCTZ 25 mg and norvasc 5 mg daily. . She denies chest pain and shortness of breath. She denies leg swelling.   3. CHRONIC DIABETES  Disease Monitoring  Blood Sugar Ranges:   Fasting:   Post prandial: 110  Polyuria: no   Visual problems: yes   Medication Compliance: yes, she reports improved sugars with [place]  Medication Side Effects  Hypoglycemia: no   4. Incontinence: fecal incontinence started after she had her daughter. She had 5 surgeries to try to correct it. The surgeries did not help. She wears adult diapers. She reports she has an episode of fecal incontinence every day. She takes lomotil and imodium,  tums two tablets with every meal and cirtucel 1 scoop in 3 oz of water to bulk stools.    Social History  Substance Use Topics  . Smoking status: Light Tobacco Smoker    Packs/day: 0.50    Years: 22.00    Types: Cigarettes  . Smokeless tobacco: Never Used  . Alcohol use 0.0 oz/week     Comment: sociial   Past Surgical History:  Procedure Laterality Date  . ACHILLES TENDON SURGERY Left 03/10/2013   Procedure: LEFT CHOPART AMPUTATION/ LEFT TENDON ACHILLES [place];  Surgeon: Wylene Simmer, MD;  Location: [facility];  Service: Orthopedics;  Laterality: Left;  . AMPUTATION Left 06/15/2012   Procedure: AMPUTATION DIGIT;  Surgeon: Meredith Pel, MD;  Location: [facility] [facility];  Service: Orthopedics;  Laterality: Left;  Left great toe revision amputation  . ENDOMETRIAL ABLATION    . LAPAROSCOPIC CHOLECYSTECTOMY  01/30/2015   [facility]    Outpatient Medications Prior to Visit  Medication Sig  Dispense Refill  . acetaminophen-codeine (TYLENOL #4) 300-60 MG tablet TAKE ONE TABLET BY MOUTH EVERY 8 HOURS AS NEEDED MOD  PAIN 90 tablet 0  . ALPRAZolam (XANAX) 0.5 MG tablet TAKE ONE TABLET BY MOUTH AT BEDTIME AS NEEDED FOR ANXIETY OR SLEEP 30 tablet 2  . amLODipine (NORVASC) 5 MG tablet Take 1 tablet (5 mg total) by mouth daily. 30 tablet 2  . Blood Glucose Monitoring Suppl (ACCU-CHEK AVIVA PLUS) W/DEVICE KIT CHECK BLOOD SUGARS THREE TIMES A DAY.  DX:250.03 1 kit 0  . calcium citrate-vitamin D 500-400 MG-UNIT chewable tablet Chew 1 tablet by mouth 2 (two) times daily. 180 tablet 1  . Cholecalciferol ([employer]S VIT D 5000 HIGH-POTENCY) 5000 units capsule Take 5,000 Units by mouth daily.    . cyclobenzaprine (FLEXERIL) 10 MG tablet Take 1 tablet (10 mg total) by mouth 3 (three) times daily as needed for muscle spasms. 60 tablet 3  . diphenoxylate-atropine (LOMOTIL) 2.5-0.025 MG tablet Take 2 tablets by mouth 3 (three) times daily before meals. Please take thirty (30) minutes prior to meal. 180 tablet 2  . DULoxetine (CYMBALTA) 60 MG capsule Take 1 capsule (60 mg total) by mouth daily. 30 capsule 5  . DULoxetine (CYMBALTA) 60 MG capsule TAKE ONE CAPSULE BY MOUTH ONCE DAILY 30 capsule 0  . fluticasone (FLONASE) 50 MCG/ACT nasal spray Place 2 sprays into both nostrils daily.  16 g 6  . glucose blood (ACCU-CHEK AVIVA PLUS) test strip USE TO CHECK BLOOD SUGAR BEFORE MEALS AND AT BEDTIME dx code E10.65 125 each 3  . hydrochlorothiazide (HYDRODIURIL) 25 MG tablet Take 25 mg by mouth daily.    . insulin aspart (NOVOLOG FLEXPEN) 100 UNIT/ML FlexPen Inject 10 Units into the skin 3 (three) times daily with meals. 15 mL 2  . LANTUS SOLOSTAR 100 UNIT/ML Solostar Pen INJECT 50 UNITS SUBCUTANEOUSLY ONCE DAILY 15 mL 3  . lisinopril (PRINIVIL,ZESTRIL) 40 MG tablet TAKE ONE TABLET BY MOUTH ONCE DAILY 30 tablet 0  . nortriptyline (PAMELOR) 75 MG capsule Take 1 capsule (75 mg total) by mouth at bedtime. 30 capsule 0  .  pregabalin (LYRICA) 50 MG capsule Take 1 capsule (50 mg total) by mouth 3 (three) times daily. 270 capsule 3  . pregabalin (LYRICA) 50 MG capsule Take 1 capsule (50 mg total) by mouth 3 (three) times daily. 90 capsule 2  . promethazine (PHENERGAN) 25 MG tablet Take 1 tablet (25 mg total) by mouth every 8 (eight) hours as needed for nausea or vomiting. 20 tablet 0  . sitaGLIPtin (JANUVIA) 50 MG tablet Take 1 tablet (50 mg total) by mouth daily. 30 tablet 2  . SUMAtriptan (IMITREX) 50 MG tablet Take 1 tablet (50 mg total) by mouth every 2 (two) hours as needed for migraine. May repeat in 2 hours if headache persists or recurs. 10 tablet 0  . zolpidem (AMBIEN) 5 MG tablet TAKE ONE TABLET BY MOUTH ONCE DAILY AT BEDTIME AS NEEDED FOR SLEEP 30 tablet 2   No facility-administered medications prior to visit.     ROS Review of Systems  Constitutional: Negative for chills and fever.  Eyes: Positive for visual disturbance.  Respiratory: Negative for shortness of breath.   Cardiovascular: Negative for chest pain.  Gastrointestinal: Positive for nausea. Negative for abdominal pain and blood in stool.  Musculoskeletal: Negative for arthralgias and back pain.  Skin: Negative for rash.  Allergic/Immunologic: Negative for immunocompromised state.  Neurological: Positive for headaches.  Hematological: Negative for adenopathy. Does not bruise/bleed easily.  Psychiatric/Behavioral: Negative for dysphoric mood and suicidal ideas.    Objective:  BP (!) 165/74   Pulse 84   Temp 98.1 F (36.7 C) (Oral)   Wt 235 lb 6.4 oz (106.8 kg)   SpO2 96%   BMI 33.78 kg/m   BP/Weight 09/01/2016 07/25/2016 08/14/4313  Systolic BP 400 867 619  Diastolic BP 74 75 93  Wt. (Lbs) 235.4 220.8 229  BMI 33.78 31.68 32.86    Physical Exam  Constitutional: She is oriented to person, place, and time. She appears well-developed and well-nourished. No distress.  HENT:  Head: Normocephalic and atraumatic.  Right Ear: Tympanic  membrane, external ear and ear canal normal.  Left Ear: Tympanic membrane, external ear and ear canal normal.  Nose: Nose normal.  Mouth/Throat: Oropharynx is clear and moist.  Eyes: Conjunctivae and EOM are normal. Pupils are equal, round, and reactive to light.  Cardiovascular: Normal rate, regular rhythm, normal heart sounds and intact distal pulses.   Pulmonary/Chest: Effort normal and breath sounds normal.  Musculoskeletal: She exhibits no edema.  Neurological: She is alert and oriented to person, place, and time.  Skin: Skin is warm and dry. No rash noted.  Psychiatric: She has a normal mood and affect.    Lab Results  Component Value Date   HGBA1C 13.0 06/26/2016   Lab Results  Component Value Date   HGBA1C 8.6  09/01/2016    CBG 162 Assessment & Plan:   Maelynn was seen today for headache.  Diagnoses and all orders for this visit:  Uncontrolled type 2 diabetes mellitus with complication, without long-term current use of insulin (HCC) -     POCT glucose (manual entry) -     POCT glycosylated hemoglobin (Hb A1C)  Other irritable bowel syndrome -     diphenoxylate-atropine (LOMOTIL) 2.5-0.025 MG tablet; Take 2 tablets by mouth 3 (three) times daily before meals. Please take thirty (30) minutes prior to meal.  Essential hypertension, benign -     BMP8+EGFR; Future -     amLODipine (NORVASC) 10 MG tablet; Take 1 tablet (10 mg total) by mouth daily.  CKD stage 3 due to type 2 diabetes mellitus (HCC) -     BMP8+EGFR; Future  Diabetic neuropathy, painful (HCC) -     pregabalin (LYRICA) 100 MG capsule; Take 1 capsule (100 mg total) by mouth 3 (three) times daily.   No orders of the defined types were placed in this encounter.   Follow-up: Return in about 4 weeks (around 09/29/2016) for HTN.   Boykin Nearing MD

## 2016-09-01 NOTE — Assessment & Plan Note (Signed)
Fecal and urinary incontinence Disability letter written

## 2016-09-01 NOTE — Patient Instructions (Addendum)
[name] was seen today for headache.  Diagnoses and all orders for this visit:  Uncontrolled type 2 diabetes mellitus with complication, without long-term current use of insulin (HCC) -     POCT glucose (manual entry) -     POCT glycosylated hemoglobin (Hb A1C)  Other irritable bowel syndrome -     diphenoxylate-atropine (LOMOTIL) 2.5-0.025 MG tablet; Take 2 tablets by mouth 3 (three) times daily before meals. Please take thirty (30) minutes prior to meal.  Essential hypertension, benign -     BMP8+EGFR; Future -     amLODipine (NORVASC) 10 MG tablet; Take 1 tablet (10 mg total) by mouth daily.  CKD stage 3 due to type 2 diabetes mellitus (HCC) -     BMP8+EGFR; Future  Diabetic neuropathy, painful (HCC) -     pregabalin (LYRICA) 100 MG capsule; Take 1 capsule (100 mg total) by mouth 3 (three) times daily.   Your A1c has improved from 13.0 to 8.6.  That is awesome improvement in 8 weeks. Please return for repeat labs to check renal function if possible I will increase [place].    F/u in 4 weeks for BP check   Dr. Adrian Blackwater

## 2016-09-01 NOTE — Assessment & Plan Note (Signed)
A: persistent painful diabetic neuropathy P: Increase lyrica to 100 mg 3 times  daily from 50 mg

## 2016-09-01 NOTE — Assessment & Plan Note (Signed)
A: improved still slightly elevated Med: compliant P: Increase norvasc to 10 mg daily

## 2016-09-29 ENCOUNTER — Other Ambulatory Visit: Payer: Self-pay | Admitting: Family Medicine

## 2016-09-29 DIAGNOSIS — E1140 Type 2 diabetes mellitus with diabetic neuropathy, unspecified: Secondary | ICD-10-CM

## 2016-09-29 DIAGNOSIS — G43109 Migraine with aura, not intractable, without status migrainosus: Secondary | ICD-10-CM

## 2016-10-03 ENCOUNTER — Encounter: Payer: Self-pay | Admitting: Family Medicine

## 2016-10-03 ENCOUNTER — Ambulatory Visit: Payer: Medicaid Other | Attending: Family Medicine | Admitting: Family Medicine

## 2016-10-03 VITALS — BP 160/78 | HR 81 | Temp 98.1°F | Ht 70.0 in | Wt 236.2 lb

## 2016-10-03 DIAGNOSIS — E1140 Type 2 diabetes mellitus with diabetic neuropathy, unspecified: Secondary | ICD-10-CM

## 2016-10-03 DIAGNOSIS — E1165 Type 2 diabetes mellitus with hyperglycemia: Secondary | ICD-10-CM | POA: Diagnosis not present

## 2016-10-03 DIAGNOSIS — R109 Unspecified abdominal pain: Secondary | ICD-10-CM

## 2016-10-03 DIAGNOSIS — E118 Type 2 diabetes mellitus with unspecified complications: Secondary | ICD-10-CM

## 2016-10-03 DIAGNOSIS — I10 Essential (primary) hypertension: Secondary | ICD-10-CM | POA: Diagnosis not present

## 2016-10-03 DIAGNOSIS — IMO0002 Reserved for concepts with insufficient information to code with codable children: Secondary | ICD-10-CM

## 2016-10-03 LAB — POCT URINALYSIS DIPSTICK
Bilirubin, UA: NEGATIVE
Glucose, UA: 100
Ketones, UA: NEGATIVE
Leukocytes, UA: NEGATIVE
Nitrite, UA: NEGATIVE
Protein, UA: >=300
Spec Grav, UA: 1.02 (ref 1.010–1.025)
Urobilinogen, UA: 0.2 E.U./dL
pH, UA: 6 (ref 5.0–8.0)

## 2016-10-03 LAB — GLUCOSE, POCT (MANUAL RESULT ENTRY): POC Glucose: 142 mg/dl — AB (ref 70–99)

## 2016-10-03 MED ORDER — AMLODIPINE BESYLATE 10 MG PO TABS: 10.0000 mg | ORAL_TABLET | Freq: Every day | ORAL | 3 refills | Status: DC

## 2016-10-03 MED ORDER — HYDROCHLOROTHIAZIDE 25 MG PO TABS: 25.0000 mg | ORAL_TABLET | Freq: Every day | ORAL | 3 refills | Status: DC

## 2016-10-03 MED ORDER — CYCLOBENZAPRINE HCL 10 MG PO TABS: 10.0000 mg | ORAL_TABLET | Freq: Three times a day (TID) | ORAL | 3 refills | Status: DC | PRN

## 2016-10-03 MED ORDER — DULOXETINE HCL 60 MG PO CPEP: 60.0000 mg | ORAL_CAPSULE | Freq: Every day | ORAL | 3 refills | Status: DC

## 2016-10-03 MED ORDER — PROMETHAZINE HCL 25 MG PO TABS: 25.0000 mg | ORAL_TABLET | Freq: Three times a day (TID) | ORAL | 0 refills | Status: DC | PRN

## 2016-10-03 MED ORDER — LISINOPRIL 40 MG PO TABS: 40.0000 mg | ORAL_TABLET | Freq: Every day | ORAL | 3 refills | Status: DC

## 2016-10-03 MED ORDER — CEPHALEXIN 500 MG PO CAPS: 500.0000 mg | ORAL_CAPSULE | Freq: Three times a day (TID) | ORAL | 0 refills | Status: DC

## 2016-10-03 MED ORDER — SITAGLIPTIN PHOSPHATE 50 MG PO TABS: 50.0000 mg | ORAL_TABLET | Freq: Every day | ORAL | 5 refills | Status: DC

## 2016-10-03 MED ORDER — ACETAMINOPHEN-CODEINE #4 300-60 MG PO TABS: ORAL_TABLET | ORAL | 0 refills | Status: DC

## 2016-10-03 NOTE — Patient Instructions (Addendum)
[name] was seen today for hypertension.  Diagnoses and all orders for this visit:  Essential hypertension, benign -     hydrochlorothiazide (HYDRODIURIL) 25 MG tablet; Take 1 tablet (25 mg total) by mouth daily. -     amLODipine (NORVASC) 10 MG tablet; Take 1 tablet (10 mg total) by mouth daily. -     lisinopril (PRINIVIL,ZESTRIL) 40 MG tablet; Take 1 tablet (40 mg total) by mouth daily.  Uncontrolled type 2 diabetes mellitus with complication, without long-term current use of insulin (HCC) -     POCT glucose (manual entry)  Bilateral flank pain -     POCT urinalysis dipstick -     Urine Culture -     Cancel: CBC -     Cancel: Comprehensive metabolic panel -     CT RENAL STONE STUDY; Future -     CBC; Future -     CMP14+EGFR; Future -     promethazine (PHENERGAN) 25 MG tablet; Take 1 tablet (25 mg total) by mouth every 8 (eight) hours as needed for nausea or vomiting. -     cyclobenzaprine (FLEXERIL) 10 MG tablet; Take 1 tablet (10 mg total) by mouth 3 (three) times daily as needed for muscle spasms. -     cephALEXin (KEFLEX) 500 MG capsule; Take 1 capsule (500 mg total) by mouth 3 (three) times daily.  Diabetic neuropathy, painful (HCC) -     DULoxetine (CYMBALTA) 60 MG capsule; Take 1 capsule (60 mg total) by mouth daily. -     acetaminophen-codeine (TYLENOL #4) 300-60 MG tablet; TAKE ONE TABLET BY MOUTH EVERY 8 HOURS AS NEEDED MOD  PAIN   You will be called on Monday with CT renal stone study date Please return next week for labs If pain worsens or you develop fever, chills, or vomiting go to ED for evaluation and CT   F/u in 4 weeks for HTN  Dr. Adrian Martinez

## 2016-10-03 NOTE — Assessment & Plan Note (Signed)
A; bilateral flank pain x 3 weeks on R and 1 week on left. No fever or emesis.  DDx: UTI, nephrolithiasis, muscle spasm P: Urine culture Keflex Flexeril and tylenol #3 refilled for pain Phenergan for nausea CT renal stone study ordered CBC and CMP orders placed  Reviewed s/s to prompt patient to seek care

## 2016-10-03 NOTE — Progress Notes (Signed)
Subjective:  Patient ID: [name], female    DOB: 1965/06/14  Age: 51 y.o. MRN: [id number]  CC: No chief complaint on file.   HPI [name] has HTN, diabetes, PAD s/p partial amputation of the L foot, CKD stage 3, anxiety and depression she  presents for     1. HTN: she is compliant with lisinopril 40 mg, HCTZ 25 mg and norvasc 5 mg daily. . She denies chest pain and shortness of breath. She denies leg swelling.   2. Flank pain: started on R side 3 weeks ago, radiating to back. She started with similar pain 1 week ago on the L side. Her L side pain is exacerbated by coughing. Pain on both sides is sharp and stabbing. Pain does not radiate to the groin. She denies hematuria and dysuria. She is incontinent of urine and feces at baseline and wears adult diapers. She reports nausea. Denies emesis and fever.   She had similar pain in 01/2015 and had gallstone on US abdomen. She is s/p laparoscopic cholecystectomy in 01/2015.     Social History  Substance Use Topics  . Smoking status: Light Tobacco Smoker    Packs/day: 0.50    Years: 22.00    Types: Cigarettes  . Smokeless tobacco: Never Used  . Alcohol use 0.0 oz/week     Comment: sociial   Past Surgical History:  Procedure Laterality Date  . ACHILLES TENDON SURGERY Left 03/10/2013   Procedure: LEFT CHOPART AMPUTATION/ LEFT TENDON ACHILLES [place];  Surgeon: Wylene Simmer, MD;  Location: [facility];  Service: Orthopedics;  Laterality: Left;  . AMPUTATION Left 06/15/2012   Procedure: AMPUTATION DIGIT;  Surgeon: Meredith Pel, MD;  Location: [facility];  Service: Orthopedics;  Laterality: Left;  Left great toe revision amputation  . ENDOMETRIAL ABLATION    . LAPAROSCOPIC CHOLECYSTECTOMY  01/30/2015   [facility]    Outpatient Medications Prior to Visit  Medication Sig Dispense Refill  . acetaminophen-codeine (TYLENOL #4) 300-60 MG tablet TAKE ONE TABLET BY MOUTH EVERY 8 HOURS AS NEEDED MOD  PAIN 90 tablet 0  .  ALPRAZolam (XANAX) 0.5 MG tablet TAKE ONE TABLET BY MOUTH AT BEDTIME AS NEEDED FOR ANXIETY OR SLEEP 30 tablet 2  . aluminum chloride (DRYSOL) 20 % external solution Apply topically at bedtime. 35 mL 5  . amLODipine (NORVASC) 10 MG tablet Take 1 tablet (10 mg total) by mouth daily. 30 tablet 5  . Blood Glucose Monitoring Suppl (ACCU-CHEK AVIVA PLUS) W/DEVICE KIT CHECK BLOOD SUGARS THREE TIMES A DAY.  DX:250.03 1 kit 0  . calcium citrate-vitamin D 500-400 MG-UNIT chewable tablet Chew 1 tablet by mouth 2 (two) times daily. 180 tablet 1  . Cholecalciferol (CVS VIT D 5000 HIGH-POTENCY) 5000 units capsule Take 5,000 Units by mouth daily.    . cyclobenzaprine (FLEXERIL) 10 MG tablet Take 1 tablet (10 mg total) by mouth 3 (three) times daily as needed for muscle spasms. 60 tablet 3  . diphenoxylate-atropine (LOMOTIL) 2.5-0.025 MG tablet Take 2 tablets by mouth 3 (three) times daily before meals. Please take thirty (30) minutes prior to meal. 180 tablet 5  . DULoxetine (CYMBALTA) 60 MG capsule Take 1 capsule (60 mg total) by mouth daily. 30 capsule 5  . DULoxetine (CYMBALTA) 60 MG capsule TAKE ONE CAPSULE BY MOUTH ONCE DAILY 30 capsule 0  . fluticasone (FLONASE) 50 MCG/ACT nasal spray Place 2 sprays into both nostrils daily. 16 g 6  . glucose blood (ACCU-CHEK AVIVA PLUS)  test strip USE TO CHECK BLOOD SUGAR BEFORE MEALS AND AT BEDTIME dx code E10.65 125 each 3  . hydrochlorothiazide (HYDRODIURIL) 25 MG tablet Take 25 mg by mouth daily.    . insulin aspart (NOVOLOG FLEXPEN) 100 UNIT/ML FlexPen Inject 10 Units into the skin 3 (three) times daily with meals. 15 mL 2  . LANTUS SOLOSTAR 100 UNIT/ML Solostar Pen INJECT 50 UNITS SUBCUTANEOUSLY ONCE DAILY 15 mL 3  . lisinopril (PRINIVIL,ZESTRIL) 40 MG tablet TAKE ONE TABLET BY MOUTH ONCE DAILY 30 tablet 0  . nortriptyline (PAMELOR) 75 MG capsule TAKE 1 CAPSULE BY MOUTH AT BEDTIME 90 capsule 0  . pregabalin (LYRICA) 100 MG capsule Take 1 capsule (100 mg total) by  mouth 3 (three) times daily. 90 capsule 5  . promethazine (PHENERGAN) 25 MG tablet Take 1 tablet (25 mg total) by mouth every 8 (eight) hours as needed for nausea or vomiting. 20 tablet 0  . sitaGLIPtin (JANUVIA) 50 MG tablet Take 1 tablet (50 mg total) by mouth daily. 30 tablet 2  . SUMAtriptan (IMITREX) 50 MG tablet Take 1 tablet (50 mg total) by mouth every 2 (two) hours as needed for migraine. May repeat in 2 hours if headache persists or recurs. 10 tablet 0  . zolpidem (AMBIEN) 5 MG tablet TAKE ONE TABLET BY MOUTH ONCE DAILY AT BEDTIME AS NEEDED FOR SLEEP 30 tablet 2   No facility-administered medications prior to visit.     ROS Review of Systems  Constitutional: Negative for chills and fever.  Eyes: Positive for visual disturbance.  Respiratory: Negative for shortness of breath.   Cardiovascular: Negative for chest pain.  Gastrointestinal: Positive for abdominal distention, abdominal pain and nausea. Negative for anal bleeding, blood in stool, constipation, diarrhea, rectal pain and vomiting.  Musculoskeletal: Negative for arthralgias and back pain.  Skin: Negative for rash.  Allergic/Immunologic: Negative for immunocompromised state.  Neurological: Positive for headaches.  Hematological: Negative for adenopathy. Does not bruise/bleed easily.  Psychiatric/Behavioral: Negative for dysphoric mood and suicidal ideas.    Objective:  BP (!) 160/78   Pulse 81   Temp 98.1 F (36.7 C) (Oral)   Ht '5\' 10"'  (1.778 m)   Wt 236 lb 3.2 oz (107.1 kg)   SpO2 97%   BMI 33.89 kg/m   BP/Weight 10/03/2016 09/01/2016 4/97/0263  Systolic BP 785 885 027  Diastolic BP 78 62 75  Wt. (Lbs) 236.2 235.4 220.8  BMI 33.89 33.78 31.68    Physical Exam  Constitutional: She is oriented to person, place, and time. She appears well-developed and well-nourished. No distress.  HENT:  Head: Normocephalic and atraumatic.  Right Ear: Tympanic membrane, external ear and ear canal normal.  Left Ear: Tympanic  membrane, external ear and ear canal normal.  Nose: Nose normal.  Mouth/Throat: Oropharynx is clear and moist.  Eyes: Pupils are equal, round, and reactive to light. Conjunctivae and EOM are normal.  Cardiovascular: Normal rate, regular rhythm, normal heart sounds and intact distal pulses.   Pulmonary/Chest: Effort normal and breath sounds normal.  Abdominal: Soft. Bowel sounds are normal. She exhibits distension. She exhibits no mass. There is tenderness. There is no rebound, no guarding and no CVA tenderness.    Musculoskeletal: She exhibits no edema.  Neurological: She is alert and oriented to person, place, and time.  Skin: Skin is warm and dry. No rash noted.  Psychiatric: She has a normal mood and affect.    Lab Results  Component Value Date   HGBA1C 8.6 09/01/2016  CBG 142  UA: 100 glucose, moderate blood, > 300 mg protein, negative nitrite and leukocytes Assessment & Plan:   [place] was seen today for hypertension.  Diagnoses and all orders for this visit:  Essential hypertension, benign -     hydrochlorothiazide (HYDRODIURIL) 25 MG tablet; Take 1 tablet (25 mg total) by mouth daily. -     amLODipine (NORVASC) 10 MG tablet; Take 1 tablet (10 mg total) by mouth daily. -     lisinopril (PRINIVIL,ZESTRIL) 40 MG tablet; Take 1 tablet (40 mg total) by mouth daily.  Uncontrolled type 2 diabetes mellitus with complication, without long-term current use of insulin (HCC) -     POCT glucose (manual entry)  Bilateral flank pain -     POCT urinalysis dipstick -     Urine Culture -     Cancel: CBC -     Cancel: Comprehensive metabolic panel -     CT RENAL STONE STUDY; Future -     CBC; Future -     CMP14+EGFR; Future -     promethazine (PHENERGAN) 25 MG tablet; Take 1 tablet (25 mg total) by mouth every 8 (eight) hours as needed for nausea or vomiting. -     cyclobenzaprine (FLEXERIL) 10 MG tablet; Take 1 tablet (10 mg total) by mouth 3 (three) times daily as needed for  muscle spasms. -     cephALEXin (KEFLEX) 500 MG capsule; Take 1 capsule (500 mg total) by mouth 3 (three) times daily.  Diabetic neuropathy, painful (HCC) -     DULoxetine (CYMBALTA) 60 MG capsule; Take 1 capsule (60 mg total) by mouth daily. -     acetaminophen-codeine (TYLENOL #4) 300-60 MG tablet; TAKE ONE TABLET BY MOUTH EVERY 8 HOURS AS NEEDED MOD  PAIN   No orders of the defined types were placed in this encounter.   Follow-up: Return in about 4 weeks (around 10/31/2016) for HTN and flank pain.   Boykin Nearing MD

## 2016-10-03 NOTE — Assessment & Plan Note (Signed)
A: hypertensive with flank pain P: Continue Norvasc 10 mg, HCTZ 25 mg, lisinopril 40 mg  Consider change from HCTZ to chlorthalidone 25 mg daily

## 2016-10-05 LAB — URINE CULTURE

## 2016-10-07 ENCOUNTER — Telehealth: Payer: Self-pay | Admitting: Family Medicine

## 2016-10-07 NOTE — Telephone Encounter (Signed)
Pt  Was called and informed of CT scan appointment date.

## 2016-10-07 NOTE — Telephone Encounter (Signed)
Pt called to speak with the nurse since she told her that she will schedule an appt for a CT scan and she still waiting for the call

## 2016-10-09 ENCOUNTER — Telehealth: Payer: Self-pay

## 2016-10-09 NOTE — Telephone Encounter (Signed)
Pt was called and informed of lab results. 

## 2016-10-13 ENCOUNTER — Ambulatory Visit (HOSPITAL_COMMUNITY): Payer: Medicaid Other

## 2016-10-17 ENCOUNTER — Telehealth: Payer: Self-pay | Admitting: Internal Medicine

## 2016-10-17 NOTE — Telephone Encounter (Signed)
Please advise on a new order.

## 2016-10-17 NOTE — Telephone Encounter (Signed)
Dr Adrian Blackwater order a Ct Renal and Medicaid denied the procedure her appt was schedule for 10-13-16 . Thank you  Based on [employer] Abdomen Imaging Guidelines, we are unable to approve the requested procedure. A recent ultrasound of the abdomen is supported for the initial evaluation of abdominal pain. Ultrasound may help confirm the diagnosis or may help determine the most appropriate next imaging test. Advanced imaging might be supported when recent ultrasounds have been performed that are technically limited or non-diagnostic. The clinical information provided does not describe these results and, therefore, the requested imaging is not indicated at this time.

## 2016-10-22 NOTE — Telephone Encounter (Signed)
Pt. Called stating that she would like to change she insulin medication. Pt. Is taking insulin aspart (NOVOLOG FLEXPEN) 100 UNIT/ML FlexPen  And would like to know if she can change it to toujeo insulin. Please f/u

## 2016-10-23 ENCOUNTER — Telehealth: Payer: Self-pay | Admitting: Family Medicine

## 2016-10-23 MED ORDER — INSULIN GLARGINE 100 UNIT/ML SOLOSTAR PEN: PEN_INJECTOR | SUBCUTANEOUS | 3 refills | Status: DC

## 2016-10-23 NOTE — Telephone Encounter (Signed)
Lantus refilled

## 2016-10-23 NOTE — Telephone Encounter (Signed)
Pt called to request a refill for LANTUS SOLOSTAR 100 UNIT/ML Solostar Pen  Please follow up

## 2016-12-04 ENCOUNTER — Other Ambulatory Visit: Payer: Self-pay | Admitting: Family Medicine

## 2016-12-04 DIAGNOSIS — E118 Type 2 diabetes mellitus with unspecified complications: Principal | ICD-10-CM

## 2016-12-04 DIAGNOSIS — Z794 Long term (current) use of insulin: Principal | ICD-10-CM

## 2016-12-04 DIAGNOSIS — E1165 Type 2 diabetes mellitus with hyperglycemia: Secondary | ICD-10-CM

## 2016-12-04 DIAGNOSIS — IMO0002 Reserved for concepts with insufficient information to code with codable children: Secondary | ICD-10-CM

## 2017-01-20 ENCOUNTER — Telehealth: Payer: Self-pay | Admitting: Internal Medicine

## 2017-01-20 NOTE — Telephone Encounter (Signed)
Pt called request a refill for  sitaGLIPtin (JANUVIA) 50 MG tablet  diphenoxylate-atropine (LOMOTIL) 2.5-0.025 MG tablet  zolpidem (AMBIEN) 5 MG tablet ALPRAZolam (XANAX) 0.5 MG tablet  Please called if the med will be approve or not

## 2017-01-26 ENCOUNTER — Telehealth: Payer: Self-pay | Admitting: Internal Medicine

## 2017-01-26 NOTE — Telephone Encounter (Signed)
Please schedule an appt, medication cannot be refilled without an appt with a new provider

## 2017-01-26 NOTE — Telephone Encounter (Signed)
Pt called to request a refill for nortriptyline (PAMELOR) 75 MG capsule  Please follow up

## 2017-01-26 NOTE — Telephone Encounter (Signed)
Pt called to check on the statu of the refill please follow up

## 2017-01-27 NOTE — Telephone Encounter (Signed)
Called pt. And LVM to return call regarding nortriptyline (PAMELOR) 75 MG capsule Medication.

## 2017-01-28 ENCOUNTER — Other Ambulatory Visit: Payer: Self-pay | Admitting: Internal Medicine

## 2017-01-28 DIAGNOSIS — E1140 Type 2 diabetes mellitus with diabetic neuropathy, unspecified: Secondary | ICD-10-CM

## 2017-01-28 DIAGNOSIS — G43109 Migraine with aura, not intractable, without status migrainosus: Secondary | ICD-10-CM

## 2017-01-28 MED ORDER — NORTRIPTYLINE HCL 75 MG PO CAPS: 75.0000 mg | ORAL_CAPSULE | Freq: Every day | ORAL | 3 refills | Status: DC

## 2017-01-30 ENCOUNTER — Other Ambulatory Visit: Payer: Self-pay | Admitting: Internal Medicine

## 2017-01-30 DIAGNOSIS — E118 Type 2 diabetes mellitus with unspecified complications: Secondary | ICD-10-CM

## 2017-01-30 DIAGNOSIS — K588 Other irritable bowel syndrome: Secondary | ICD-10-CM

## 2017-01-30 DIAGNOSIS — E1165 Type 2 diabetes mellitus with hyperglycemia: Secondary | ICD-10-CM

## 2017-01-30 DIAGNOSIS — G4700 Insomnia, unspecified: Secondary | ICD-10-CM

## 2017-01-30 DIAGNOSIS — IMO0002 Reserved for concepts with insufficient information to code with codable children: Secondary | ICD-10-CM

## 2017-01-30 MED ORDER — ZOLPIDEM TARTRATE 5 MG PO TABS: ORAL_TABLET | ORAL | 2 refills | Status: DC

## 2017-01-30 MED ORDER — DIPHENOXYLATE-ATROPINE 2.5-0.025 MG PO TABS: 2.0000 | ORAL_TABLET | Freq: Three times a day (TID) | ORAL | 5 refills | Status: DC

## 2017-01-30 MED ORDER — SITAGLIPTIN PHOSPHATE 50 MG PO TABS: 50.0000 mg | ORAL_TABLET | Freq: Every day | ORAL | 5 refills | Status: DC

## 2017-01-30 NOTE — Telephone Encounter (Signed)
Refilled electronically. Xanax will not be prescribed. If this medication is needed, will refer to psychiatrist.

## 2017-02-02 ENCOUNTER — Encounter: Payer: Self-pay | Admitting: Pharmacist

## 2017-02-02 NOTE — Progress Notes (Signed)
Prior authorization completed for zolpidem through [facility] Medicaid. Approved # [id number]

## 2017-02-11 NOTE — Telephone Encounter (Signed)
Medical Assistant left message on patient's home and cell voicemail. Voicemail states to give a call back to [place] with [facility] at [phone]. !!!Please ask patient if she requested adult diapers through [employer]!!!

## 2017-02-16 ENCOUNTER — Encounter: Payer: Self-pay | Admitting: Internal Medicine

## 2017-02-16 ENCOUNTER — Ambulatory Visit: Payer: Medicaid Other | Attending: Internal Medicine | Admitting: Internal Medicine

## 2017-02-16 ENCOUNTER — Other Ambulatory Visit: Payer: Self-pay

## 2017-02-16 VITALS — BP 179/71 | HR 85 | Temp 98.6°F | Resp 18 | Ht 70.0 in | Wt 222.0 lb

## 2017-02-16 DIAGNOSIS — E1122 Type 2 diabetes mellitus with diabetic chronic kidney disease: Secondary | ICD-10-CM | POA: Insufficient documentation

## 2017-02-16 DIAGNOSIS — E559 Vitamin D deficiency, unspecified: Secondary | ICD-10-CM | POA: Diagnosis not present

## 2017-02-16 DIAGNOSIS — F419 Anxiety disorder, unspecified: Secondary | ICD-10-CM | POA: Diagnosis not present

## 2017-02-16 DIAGNOSIS — E1142 Type 2 diabetes mellitus with diabetic polyneuropathy: Secondary | ICD-10-CM

## 2017-02-16 DIAGNOSIS — Z7951 Long term (current) use of inhaled steroids: Secondary | ICD-10-CM | POA: Insufficient documentation

## 2017-02-16 DIAGNOSIS — I129 Hypertensive chronic kidney disease with stage 1 through stage 4 chronic kidney disease, or unspecified chronic kidney disease: Secondary | ICD-10-CM | POA: Insufficient documentation

## 2017-02-16 DIAGNOSIS — Z8249 Family history of ischemic heart disease and other diseases of the circulatory system: Secondary | ICD-10-CM | POA: Diagnosis not present

## 2017-02-16 DIAGNOSIS — IMO0002 Reserved for concepts with insufficient information to code with codable children: Secondary | ICD-10-CM

## 2017-02-16 DIAGNOSIS — Z79899 Other long term (current) drug therapy: Secondary | ICD-10-CM | POA: Insufficient documentation

## 2017-02-16 DIAGNOSIS — G43109 Migraine with aura, not intractable, without status migrainosus: Secondary | ICD-10-CM | POA: Diagnosis not present

## 2017-02-16 DIAGNOSIS — I10 Essential (primary) hypertension: Secondary | ICD-10-CM | POA: Diagnosis not present

## 2017-02-16 DIAGNOSIS — L609 Nail disorder, unspecified: Secondary | ICD-10-CM | POA: Insufficient documentation

## 2017-02-16 DIAGNOSIS — F329 Major depressive disorder, single episode, unspecified: Secondary | ICD-10-CM | POA: Diagnosis not present

## 2017-02-16 DIAGNOSIS — Z6831 Body mass index (BMI) 31.0-31.9, adult: Secondary | ICD-10-CM | POA: Insufficient documentation

## 2017-02-16 DIAGNOSIS — N183 Chronic kidney disease, stage 3 unspecified: Secondary | ICD-10-CM

## 2017-02-16 DIAGNOSIS — E118 Type 2 diabetes mellitus with unspecified complications: Secondary | ICD-10-CM | POA: Diagnosis present

## 2017-02-16 DIAGNOSIS — E669 Obesity, unspecified: Secondary | ICD-10-CM | POA: Diagnosis not present

## 2017-02-16 DIAGNOSIS — K219 Gastro-esophageal reflux disease without esophagitis: Secondary | ICD-10-CM | POA: Insufficient documentation

## 2017-02-16 DIAGNOSIS — Z833 Family history of diabetes mellitus: Secondary | ICD-10-CM | POA: Insufficient documentation

## 2017-02-16 DIAGNOSIS — G4700 Insomnia, unspecified: Secondary | ICD-10-CM | POA: Insufficient documentation

## 2017-02-16 DIAGNOSIS — E1165 Type 2 diabetes mellitus with hyperglycemia: Secondary | ICD-10-CM

## 2017-02-16 DIAGNOSIS — R002 Palpitations: Secondary | ICD-10-CM | POA: Diagnosis not present

## 2017-02-16 DIAGNOSIS — Z89432 Acquired absence of left foot: Secondary | ICD-10-CM | POA: Insufficient documentation

## 2017-02-16 DIAGNOSIS — N393 Stress incontinence (female) (male): Secondary | ICD-10-CM | POA: Diagnosis not present

## 2017-02-16 DIAGNOSIS — K589 Irritable bowel syndrome without diarrhea: Secondary | ICD-10-CM | POA: Diagnosis not present

## 2017-02-16 DIAGNOSIS — F17210 Nicotine dependence, cigarettes, uncomplicated: Secondary | ICD-10-CM | POA: Diagnosis not present

## 2017-02-16 DIAGNOSIS — Z794 Long term (current) use of insulin: Secondary | ICD-10-CM | POA: Insufficient documentation

## 2017-02-16 DIAGNOSIS — L602 Onychogryphosis: Secondary | ICD-10-CM

## 2017-02-16 LAB — POCT URINALYSIS DIPSTICK
Bilirubin, UA: NEGATIVE
Glucose, UA: 500
Ketones, UA: NEGATIVE
Leukocytes, UA: NEGATIVE
Nitrite, UA: NEGATIVE
Protein, UA: >=30
Spec Grav, UA: 1.02 (ref 1.010–1.025)
Urobilinogen, UA: 0.2 E.U./dL
pH, UA: 5.5 (ref 5.0–8.0)

## 2017-02-16 LAB — GLUCOSE, POCT (MANUAL RESULT ENTRY): POC Glucose: 346 mg/dl — AB (ref 70–99)

## 2017-02-16 LAB — POCT GLYCOSYLATED HEMOGLOBIN (HGB A1C): Hemoglobin A1C: 13.3

## 2017-02-16 MED ORDER — INSULIN GLARGINE 100 UNIT/ML SOLOSTAR PEN: PEN_INJECTOR | SUBCUTANEOUS | 3 refills | Status: DC

## 2017-02-16 MED ORDER — INSULIN ASPART 100 UNIT/ML FLEXPEN: 13.0000 [IU] | PEN_INJECTOR | Freq: Three times a day (TID) | SUBCUTANEOUS | 6 refills | Status: DC

## 2017-02-16 MED ORDER — HYDROCHLOROTHIAZIDE 25 MG PO TABS: 25.0000 mg | ORAL_TABLET | Freq: Every day | ORAL | 3 refills | Status: DC

## 2017-02-16 NOTE — Patient Instructions (Addendum)
Please give appointment with Stacy in 1 month for recheck blood sugars.   Increase LAntus to 55 units and Novolog to 13 units with meals.  Check your blood sugars twice a day before meals. If after 3 days your morning blood sugars are still running higher than 150, increase Lantus to 60 units daily.

## 2017-02-16 NOTE — Progress Notes (Signed)
Patient ID: [name], female    DOB: 03/25/1965  MRN: [id number]  CC: Establish Care   Subjective: [name] is a 51 y.o. female who presents for chronic ds management Her concerns today include:  HTN, DM with neuropathy (on Cymbalta and Tylenol #3), PAD s/p partial amp LT foot, CKD stage 3, anxiety/dep, IBS on Lomotil  DM: -not checking BS regularly but has glucometer Eating habits: trying to eat better. Drinks mainly water; admits to sweet tea. She avoids bread and rice -Exercise: does a lot of walking on the job and active around her home -Last eye exam was 2 yrs ago. No problems with vision. Wears readers -out of Nortriptyline for 2 wks but now back on it.  Had significant increase in neuropathic pain in her feet when she was off the nortriptyline and even now that she is back on it.  Lyrica is on med list but she reports that she is no longer taking that or gabapentin.  He does not wish to get back on gabapentin because of weight gain  2. HTN: on Norvasc, HCTZ and Lisinopril.  Limits salt -feels palpitations for 2-3 mins and occurs 3 times a day or more for past 2 wks.  -loss 14 lbs since August. Intentional wgh loss. + feeling hot due to menopause No constipation. + chronic diarrhea. Takes Lomotil on average takes 2 tabs BID.   3.  CKD: followed by [facility]. Last seen about 6 mths ago. She plans to call and schedule follow-up appointment  4.  Wanting RF on Ambien and Xanax.  Reportedly takes Ambien every night but [facility] shows last RF 11/01/2016.  Dr Doreene Burke RF for her 01/2017.  She has not picked up as yet.  Took Xanax for "depression" Patient Active Problem List   Diagnosis Date Noted  . Bilateral flank pain 10/03/2016  . Hyperhidrosis 09/01/2016  . Migraine with aura and without status migrainosus, not intractable 07/28/2016  . Elevated lipase 03/19/2015  . 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 CKD stage 3 due to type 2  diabetes mellitus ([place]) 06/27/2014  . Vitamin D insufficiency 05/08/2014  . Obesity (BMI 30.0-34.9) 05/08/2014  . Unilateral amputation of left foot ([place]) 05/08/2014  . Bursitis of left shoulder 02/14/2014  . Neck pain 01/03/2014  . Diabetes mellitus type 2, uncontrolled, with complications ([place]) 63/78/5885  . 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.23) 01/14/2013  . Insomnia 08/04/2012  . Seborrheic keratosis 05/12/2012  . Diabetic neuropathy, painful ([place]) 08/04/2011  . Anxiety and depression 05/16/2010  . Essential hypertension, benign 11/01/2007  . Female stress incontinence 11/01/2007  . TOBACCO DEPENDENCE 04/30/2006  . GASTROESOPHAGEAL REFLUX, NO ESOPHAGITIS 04/30/2006  . Irritable bowel syndrome 04/30/2006     Current Outpatient Medications on File Prior to Visit  Medication Sig Dispense Refill  . acetaminophen-codeine (TYLENOL #4) 300-60 MG tablet TAKE ONE TABLET BY MOUTH EVERY 8 HOURS AS NEEDED MOD  PAIN 90 tablet 0  . aluminum chloride (DRYSOL) 20 % external solution Apply topically at bedtime. 35 mL 5  . amLODipine (NORVASC) 10 MG tablet Take 1 tablet (10 mg total) by mouth daily. 90 tablet 3  . Blood Glucose Monitoring Suppl (ACCU-CHEK AVIVA PLUS) W/DEVICE KIT CHECK BLOOD SUGARS THREE TIMES A DAY.  DX:250.03 1 kit 0  . calcium citrate-vitamin D 500-400 MG-UNIT chewable tablet Chew 1 tablet by mouth 2 (two) times daily. 180 tablet 1  . Cholecalciferol ([employer] VIT D 5000  HIGH-POTENCY) 5000 units capsule Take 5,000 Units by mouth daily.    . cyclobenzaprine (FLEXERIL) 10 MG tablet Take 1 tablet (10 mg total) by mouth 3 (three) times daily as needed for muscle spasms. 60 tablet 3  . diphenoxylate-atropine (LOMOTIL) 2.5-0.025 MG tablet Take 2 tablets by mouth 3 (three) times daily before meals. Please take thirty (30) minutes prior to meal. 180 tablet 5  . DULoxetine (CYMBALTA) 60 MG capsule Take 1 capsule (60 mg total) by mouth daily. 90 capsule 3    . fluticasone (FLONASE) 50 MCG/ACT nasal spray Place 2 sprays into both nostrils daily. 16 g 6  . glucose blood (ACCU-CHEK AVIVA PLUS) test strip USE TO CHECK BLOOD SUGAR BEFORE MEALS AND AT BEDTIME dx code E10.65 125 each 3  . hydrochlorothiazide (HYDRODIURIL) 25 MG tablet Take 1 tablet (25 mg total) by mouth daily. 90 tablet 3  . Insulin Glargine (LANTUS SOLOSTAR) 100 UNIT/ML Solostar Pen INJECT 50 UNITS SUBCUTANEOUSLY ONCE DAILY 15 mL 3  . lisinopril (PRINIVIL,ZESTRIL) 40 MG tablet Take 1 tablet (40 mg total) by mouth daily. 90 tablet 3  . nortriptyline (PAMELOR) 75 MG capsule Take 1 capsule (75 mg total) by mouth at bedtime. 90 capsule 3  . NOVOLOG FLEXPEN 100 UNIT/ML FlexPen INJECT 10 UNITS SUBCUTANEOUSLY THREE TIMES DAILY WITH MEALS 15 mL 0  . promethazine (PHENERGAN) 25 MG tablet Take 1 tablet (25 mg total) by mouth every 8 (eight) hours as needed for nausea or vomiting. 20 tablet 0  . sitaGLIPtin (JANUVIA) 50 MG tablet Take 1 tablet (50 mg total) by mouth daily. 30 tablet 5  . SUMAtriptan (IMITREX) 50 MG tablet Take 1 tablet (50 mg total) by mouth every 2 (two) hours as needed for migraine. May repeat in 2 hours if headache persists or recurs. 10 tablet 0  . zolpidem (AMBIEN) 5 MG tablet TAKE ONE TABLET BY MOUTH ONCE DAILY AT BEDTIME AS NEEDED FOR SLEEP 30 tablet 2   No current facility-administered medications on file prior to visit.     Allergies  Allergen Reactions  . Latex Rash    Social History   Socioeconomic History  . Marital status: Divorced    Spouse name: Not on file  . Number of children: 1  . Years of education: Not on file  . Highest education level: Not on file  Social Needs  . Financial resource strain: Not on file  . Food insecurity - worry: Not on file  . Food insecurity - inability: Not on file  . Transportation needs - medical: Not on file  . Transportation needs - non-medical: Not on file  Occupational History    Employer: [employer]  Tobacco Use  .  Smoking status: Light Tobacco Smoker    Packs/day: 0.50    Years: 22.00    Pack years: 11.00    Types: Cigarettes  . Smokeless tobacco: Never Used  Substance and Sexual Activity  . Alcohol use: Yes    Alcohol/week: 0.0 oz    Comment: sociial  . Drug use: No  . Sexual activity: Yes    Partners: Male    Birth control/protection: IUD  Other Topics Concern  . Not on file  Social History Narrative  . Not on file    Family History  Problem Relation Age of Onset  . Diabetes Mother   . Heart attack Mother   . Heart disease Mother        before age 9  . Hypertension Mother   . Hyperlipidemia Mother   .  Diabetes Father   . Heart attack Father   . Heart disease Father        before age 11  . Diabetes Sister   . Hypertension Sister     Past Surgical History:  Procedure Laterality Date  . ACHILLES TENDON SURGERY Left 03/10/2013   Procedure: LEFT CHOPART AMPUTATION/ LEFT TENDON ACHILLES [place];  Surgeon: Wylene Simmer, MD;  Location: [facility];  Service: Orthopedics;  Laterality: Left;  . AMPUTATION Left 06/15/2012   Procedure: AMPUTATION DIGIT;  Surgeon: Meredith Pel, MD;  Location: [facility];  Service: Orthopedics;  Laterality: Left;  Left great toe revision amputation  . ENDOMETRIAL ABLATION    . LAPAROSCOPIC CHOLECYSTECTOMY  01/30/2015   [facility] day surgery     ROS: Review of Systems Neg except as above PHYSICAL EXAM: BP (!) 179/71 (BP Location: Left Arm, Patient Position: Sitting, Cuff Size: Normal)   Pulse 85   Temp 98.6 F (37 C) (Oral)   Resp 18   Ht '5\' 10"'  (1.778 m)   Wt 222 lb (100.7 kg)   SpO2 99%   BMI 31.85 kg/m   Wt Readings from Last 3 Encounters:  02/16/17 222 lb (100.7 kg)  10/03/16 236 lb 3.2 oz (107.1 kg)  09/01/16 235 lb 6.4 oz (106.8 kg)  Repeat BP 140/80  Physical Exam  General appearance - alert, well appearing, middle age caucasian female and in no distress Mental status - alert, oriented to person, place, and time, normal mood, behavior,  speech, dress, motor activity, and thought processes Eyes - pupils equal and reactive, extraocular eye movements intact Mouth - mucous membranes moist, pharynx normal without lesions Neck - supple, no significant adenopathy. No thyroid enlargement Chest - clear to auscultation, no wheezes, rales or rhonchi, symmetric air entry Heart - normal rate, regular rhythm, normal S1, S2, no murmurs, rubs, clicks or gallops Extremities - peripheral pulses normal, no pedal edema, no clubbing or cyanosis LEAP: RT foot: toe nails thick and overgrown. Callous medial aspect of big toe. DP/PT pulse 3+  Results for orders placed or performed in visit on 02/16/17  HgB A1c  Result Value Ref Range   Hemoglobin A1C 13.3   Glucose (CBG)  Result Value Ref Range   POC Glucose 346 (A) 70 - 99 mg/dl  Urinalysis Dipstick  Result Value Ref Range   Color, UA yellow    Clarity, UA cloudy    Glucose, UA 500    Bilirubin, UA neg    Ketones, UA neg    Spec Grav, UA 1.020 1.010 - 1.025   Blood, UA moderate    pH, UA 5.5 5.0 - 8.0   Protein, UA >=30    Urobilinogen, UA 0.2 0.2 or 1.0 E.U./dL   Nitrite, UA neg    Leukocytes, UA Negative Negative   Appearance     Odor      ASSESSMENT AND PLAN: 1. Diabetes mellitus type 2, uncontrolled, with complications ([place]) Healthy eating habits discussed. Increase Lantus to 55 units daily.  Check blood sugars at least twice a day.  If after 3 days morning blood sugars are still greater than 150 patient should increase Lantus to 60 units. Increase NovoLog to 13 units with meals. -She declined a shot of NovoLog today here in the office. She clinical pharmacist in 1 mth for insulin titration - HgB A1c - Glucose (CBG) - Urinalysis Dipstick - insulin aspart (NOVOLOG FLEXPEN) 100 UNIT/ML FlexPen; Inject 13 Units into the skin 3 (three) times daily with meals.  Dispense: 15 mL; Refill: 6 - Insulin Glargine (LANTUS SOLOSTAR) 100 UNIT/ML Solostar Pen; INJECT 55 UNITS  SUBCUTANEOUSLY ONCE DAILY  Dispense: 15 mL; Refill: 3 - Comprehensive metabolic panel - CBC - Lipid panel  2. Essential hypertension -At goal.  Continue current medications. - hydrochlorothiazide (HYDRODIURIL) 25 MG tablet; Take 1 tablet (25 mg total) by mouth daily.  Dispense: 90 tablet; Refill: 3  3. Palpitations EKG revealed normal sinus rhythm with T inversion in lead III Check electrolytes and TSH.  If normal will need Holter monitor for 48 hours - TSH - EKG 12-Lead  4. CKD (chronic kidney disease), stage III ([facility]) Avoid NSAIDs. Patient will call and schedule follow-up appointment with nephrologist.  5. Insomnia, unspecified type Good sleep hygiene discussed. Advise using Ambien only as needed and not every night.  She will pick up refills from the pharmacy  6. Diabetic polyneuropathy associated with type 2 diabetes mellitus ([place]) Hold off on increasing the nortriptyline until we have worked up the palpitations  7. Overgrown toenails - Ambulatory referral to Podiatry   Patient was given the opportunity to ask questions.  Patient verbalized understanding of the plan and was able to repeat key elements of the plan.   Orders Placed This Encounter  Procedures  . HgB A1c  . Glucose (CBG)  . Urinalysis Dipstick     Requested Prescriptions    No prescriptions requested or ordered in this encounter    F/u in 2 mths Karle Plumber, MD, Rosalita Chessman

## 2017-02-17 LAB — COMPREHENSIVE METABOLIC PANEL
ALT: 18 IU/L (ref 0–32)
AST: 18 IU/L (ref 0–40)
Albumin/Globulin Ratio: 1.3 (ref 1.2–2.2)
Albumin: 3.6 g/dL (ref 3.5–5.5)
Alkaline Phosphatase: 145 IU/L — ABNORMAL HIGH (ref 39–117)
BUN/Creatinine Ratio: 10 (ref 9–23)
BUN: 26 mg/dL — ABNORMAL HIGH (ref 6–24)
Bilirubin Total: <0.2 mg/dL (ref 0.0–1.2)
CO2: 21 mmol/L (ref 20–29)
Calcium: 8.6 mg/dL — ABNORMAL LOW (ref 8.7–10.2)
Chloride: 102 mmol/L (ref 96–106)
Creatinine, Ser: 2.59 mg/dL — ABNORMAL HIGH (ref 0.57–1.00)
GFR calc Af Amer: 24 mL/min/{1.73_m2} — ABNORMAL LOW (ref >59)
GFR calc non Af Amer: 21 mL/min/{1.73_m2} — ABNORMAL LOW (ref >59)
Globulin, Total: 2.8 g/dL (ref 1.5–4.5)
Glucose: 357 mg/dL — ABNORMAL HIGH (ref 65–99)
Potassium: 4.6 mmol/L (ref 3.5–5.2)
Sodium: 136 mmol/L (ref 134–144)
Total Protein: 6.4 g/dL (ref 6.0–8.5)

## 2017-02-17 LAB — CBC
Hematocrit: 40.5 % (ref 34.0–46.6)
Hemoglobin: 12.8 g/dL (ref 11.1–15.9)
MCH: 27.5 pg (ref 26.6–33.0)
MCHC: 31.6 g/dL (ref 31.5–35.7)
MCV: 87 fL (ref 79–97)
Platelets: 328 10*3/uL (ref 150–379)
RBC: 4.66 x10E6/uL (ref 3.77–5.28)
RDW: 14.3 % (ref 12.3–15.4)
WBC: 10.1 10*3/uL (ref 3.4–10.8)

## 2017-02-17 LAB — LIPID PANEL
Chol/HDL Ratio: 8.3 ratio — ABNORMAL HIGH (ref 0.0–4.4)
Cholesterol, Total: 275 mg/dL — ABNORMAL HIGH (ref 100–199)
HDL: 33 mg/dL — ABNORMAL LOW (ref >39)
Triglycerides: 677 mg/dL (ref 0–149)

## 2017-02-17 LAB — TSH: TSH: 1.91 u[IU]/mL (ref 0.450–4.500)

## 2017-02-18 ENCOUNTER — Telehealth: Payer: Self-pay | Admitting: Internal Medicine

## 2017-02-18 DIAGNOSIS — E785 Hyperlipidemia, unspecified: Secondary | ICD-10-CM

## 2017-02-18 NOTE — Telephone Encounter (Signed)
Pt. Returned call regarding her lab results. Pt was told that the nurse would call her back b/c she was with a pt. Please f/u with pt.

## 2017-02-18 NOTE — Telephone Encounter (Signed)
PC placed to pt this a.m. I left message on VM that I was calling to discuss lab results. I requested that she call the office and ask to speak with my nurse to get lab results; it is very important that she follows up on this. When pt calls back please let her know the following: Her kidney function has worsened significantly compared to 7 mths ago.  She should call and get in with her nephrologist as soon as possible. Given her kidney function, I would suggest cutting back on Lomotil to one tablet twice a day. Thyroid level is normal. Her triglyceride level is over 600 with normal being  150 or less. I would like for her to return to the lab at her convenience FASTING (do not eat for 4 hours prior to blood draw) for a repeat check. Best time to do it is one morning before she has breakfast.

## 2017-02-19 NOTE — Telephone Encounter (Signed)
Returned pt call pt didn't answer lvm asking pt to give [place] a call back. Will be mailing letter out to pt.

## 2017-03-11 ENCOUNTER — Encounter: Payer: Self-pay | Admitting: Internal Medicine

## 2017-03-11 NOTE — Progress Notes (Signed)
Rec note from [place], Dr. Posey Pronto. HCTZ d/c. Pt started on Dozazosin 4 mg instead to help control BP better. 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 Wants to continue Lisinopril unless she develops problem with increase K+.

## 2017-04-07 ENCOUNTER — Inpatient Hospital Stay (HOSPITAL_COMMUNITY)
Admission: EM | Admit: 2017-04-07 | Discharge: 2017-04-10 | DRG: 065 | Disposition: A | Discharge status: Home / Self Care | Payer: Medicaid Other | Attending: Internal Medicine | Admitting: Internal Medicine

## 2017-04-07 ENCOUNTER — Inpatient Hospital Stay (HOSPITAL_COMMUNITY): Payer: Medicaid Other

## 2017-04-07 ENCOUNTER — Emergency Department (HOSPITAL_COMMUNITY): Payer: Medicaid Other

## 2017-04-07 ENCOUNTER — Encounter (HOSPITAL_COMMUNITY): Payer: Self-pay

## 2017-04-07 ENCOUNTER — Other Ambulatory Visit: Payer: Self-pay

## 2017-04-07 DIAGNOSIS — N183 Chronic kidney disease, stage 3 unspecified: Secondary | ICD-10-CM | POA: Diagnosis present

## 2017-04-07 DIAGNOSIS — F419 Anxiety disorder, unspecified: Secondary | ICD-10-CM | POA: Diagnosis present

## 2017-04-07 DIAGNOSIS — E669 Obesity, unspecified: Secondary | ICD-10-CM | POA: Diagnosis present

## 2017-04-07 DIAGNOSIS — E118 Type 2 diabetes mellitus with unspecified complications: Secondary | ICD-10-CM

## 2017-04-07 DIAGNOSIS — E1140 Type 2 diabetes mellitus with diabetic neuropathy, unspecified: Secondary | ICD-10-CM | POA: Diagnosis present

## 2017-04-07 DIAGNOSIS — Z6831 Body mass index (BMI) 31.0-31.9, adult: Secondary | ICD-10-CM | POA: Diagnosis not present

## 2017-04-07 DIAGNOSIS — R29810 Facial weakness: Secondary | ICD-10-CM | POA: Diagnosis present

## 2017-04-07 DIAGNOSIS — I161 Hypertensive emergency: Secondary | ICD-10-CM | POA: Diagnosis present

## 2017-04-07 DIAGNOSIS — E785 Hyperlipidemia, unspecified: Secondary | ICD-10-CM | POA: Diagnosis present

## 2017-04-07 DIAGNOSIS — Z833 Family history of diabetes mellitus: Secondary | ICD-10-CM

## 2017-04-07 DIAGNOSIS — J069 Acute upper respiratory infection, unspecified: Secondary | ICD-10-CM | POA: Diagnosis present

## 2017-04-07 DIAGNOSIS — F17210 Nicotine dependence, cigarettes, uncomplicated: Secondary | ICD-10-CM | POA: Diagnosis present

## 2017-04-07 DIAGNOSIS — E871 Hypo-osmolality and hyponatremia: Secondary | ICD-10-CM | POA: Diagnosis present

## 2017-04-07 DIAGNOSIS — S98322A Partial traumatic amputation of left midfoot, initial encounter: Secondary | ICD-10-CM | POA: Diagnosis not present

## 2017-04-07 DIAGNOSIS — F17200 Nicotine dependence, unspecified, uncomplicated: Secondary | ICD-10-CM | POA: Diagnosis present

## 2017-04-07 DIAGNOSIS — I6330 Cerebral infarction due to thrombosis of unspecified cerebral artery: Secondary | ICD-10-CM

## 2017-04-07 DIAGNOSIS — R531 Weakness: Secondary | ICD-10-CM

## 2017-04-07 DIAGNOSIS — G8191 Hemiplegia, unspecified affecting right dominant side: Secondary | ICD-10-CM | POA: Diagnosis present

## 2017-04-07 DIAGNOSIS — K219 Gastro-esophageal reflux disease without esophagitis: Secondary | ICD-10-CM | POA: Diagnosis present

## 2017-04-07 DIAGNOSIS — I10 Essential (primary) hypertension: Secondary | ICD-10-CM | POA: Diagnosis present

## 2017-04-07 DIAGNOSIS — Z794 Long term (current) use of insulin: Secondary | ICD-10-CM

## 2017-04-07 DIAGNOSIS — Z8249 Family history of ischemic heart disease and other diseases of the circulatory system: Secondary | ICD-10-CM | POA: Diagnosis not present

## 2017-04-07 DIAGNOSIS — R269 Unspecified abnormalities of gait and mobility: Secondary | ICD-10-CM | POA: Diagnosis not present

## 2017-04-07 DIAGNOSIS — E1122 Type 2 diabetes mellitus with diabetic chronic kidney disease: Secondary | ICD-10-CM | POA: Diagnosis present

## 2017-04-07 DIAGNOSIS — I639 Cerebral infarction, unspecified: Secondary | ICD-10-CM | POA: Diagnosis not present

## 2017-04-07 DIAGNOSIS — Z79899 Other long term (current) drug therapy: Secondary | ICD-10-CM

## 2017-04-07 DIAGNOSIS — F329 Major depressive disorder, single episode, unspecified: Secondary | ICD-10-CM | POA: Diagnosis present

## 2017-04-07 DIAGNOSIS — I129 Hypertensive chronic kidney disease with stage 1 through stage 4 chronic kidney disease, or unspecified chronic kidney disease: Secondary | ICD-10-CM | POA: Diagnosis present

## 2017-04-07 DIAGNOSIS — E1129 Type 2 diabetes mellitus with other diabetic kidney complication: Secondary | ICD-10-CM | POA: Diagnosis present

## 2017-04-07 DIAGNOSIS — IMO0002 Reserved for concepts with insufficient information to code with codable children: Secondary | ICD-10-CM | POA: Diagnosis present

## 2017-04-07 DIAGNOSIS — E1165 Type 2 diabetes mellitus with hyperglycemia: Secondary | ICD-10-CM | POA: Diagnosis present

## 2017-04-07 DIAGNOSIS — N179 Acute kidney failure, unspecified: Secondary | ICD-10-CM | POA: Diagnosis present

## 2017-04-07 DIAGNOSIS — R2990 Unspecified symptoms and signs involving the nervous system: Secondary | ICD-10-CM | POA: Diagnosis not present

## 2017-04-07 DIAGNOSIS — R4781 Slurred speech: Secondary | ICD-10-CM | POA: Diagnosis not present

## 2017-04-07 DIAGNOSIS — J449 Chronic obstructive pulmonary disease, unspecified: Secondary | ICD-10-CM | POA: Diagnosis present

## 2017-04-07 DIAGNOSIS — Z7951 Long term (current) use of inhaled steroids: Secondary | ICD-10-CM

## 2017-04-07 DIAGNOSIS — R471 Dysarthria and anarthria: Secondary | ICD-10-CM | POA: Diagnosis present

## 2017-04-07 DIAGNOSIS — M6289 Other specified disorders of muscle: Secondary | ICD-10-CM | POA: Diagnosis not present

## 2017-04-07 DIAGNOSIS — R29703 NIHSS score 3: Secondary | ICD-10-CM | POA: Diagnosis present

## 2017-04-07 DIAGNOSIS — I5030 Unspecified diastolic (congestive) heart failure: Secondary | ICD-10-CM | POA: Diagnosis not present

## 2017-04-07 LAB — DIFFERENTIAL
Basophils Absolute: 0.1 10*3/uL (ref 0.0–0.1)
Basophils Relative: 1 %
Eosinophils Absolute: 0 10*3/uL (ref 0.0–0.7)
Eosinophils Relative: 0 %
Lymphocytes Relative: 18 %
Lymphs Abs: 1.7 10*3/uL (ref 0.7–4.0)
Monocytes Absolute: 0.7 10*3/uL (ref 0.1–1.0)
Monocytes Relative: 8 %
Neutro Abs: 6.6 10*3/uL (ref 1.7–7.7)
Neutrophils Relative %: 73 %

## 2017-04-07 LAB — COMPREHENSIVE METABOLIC PANEL
ALT: 45 U/L (ref 14–54)
AST: 45 U/L — ABNORMAL HIGH (ref 15–41)
Albumin: 3.2 g/dL — ABNORMAL LOW (ref 3.5–5.0)
Alkaline Phosphatase: 129 U/L — ABNORMAL HIGH (ref 38–126)
Anion gap: 13 (ref 5–15)
BUN: 24 mg/dL — ABNORMAL HIGH (ref 6–20)
CO2: 20 mmol/L — ABNORMAL LOW (ref 22–32)
Calcium: 8.6 mg/dL — ABNORMAL LOW (ref 8.9–10.3)
Chloride: 101 mmol/L (ref 101–111)
Creatinine, Ser: 2.55 mg/dL — ABNORMAL HIGH (ref 0.44–1.00)
GFR calc Af Amer: 24 mL/min — ABNORMAL LOW (ref >60)
GFR calc non Af Amer: 21 mL/min — ABNORMAL LOW (ref >60)
Glucose, Bld: 364 mg/dL — ABNORMAL HIGH (ref 65–99)
Potassium: 4.1 mmol/L (ref 3.5–5.1)
Sodium: 134 mmol/L — ABNORMAL LOW (ref 135–145)
Total Bilirubin: 0.5 mg/dL (ref 0.3–1.2)
Total Protein: 6.8 g/dL (ref 6.5–8.1)

## 2017-04-07 LAB — I-STAT CHEM 8, ED
BUN: 26 mg/dL — ABNORMAL HIGH (ref 6–20)
Calcium, Ion: 1.1 mmol/L — ABNORMAL LOW (ref 1.15–1.40)
Chloride: 102 mmol/L (ref 101–111)
Creatinine, Ser: 2.5 mg/dL — ABNORMAL HIGH (ref 0.44–1.00)
Glucose, Bld: 324 mg/dL — ABNORMAL HIGH (ref 65–99)
HCT: 45 % (ref 36.0–46.0)
Hemoglobin: 15.3 g/dL — ABNORMAL HIGH (ref 12.0–15.0)
Potassium: 4.2 mmol/L (ref 3.5–5.1)
Sodium: 138 mmol/L (ref 135–145)
TCO2: 24 mmol/L (ref 22–32)

## 2017-04-07 LAB — CBC
HCT: 39.6 % (ref 36.0–46.0)
Hemoglobin: 12.7 g/dL (ref 12.0–15.0)
MCH: 27.7 pg (ref 26.0–34.0)
MCHC: 32.1 g/dL (ref 30.0–36.0)
MCV: 86.3 fL (ref 78.0–100.0)
Platelets: 239 10*3/uL (ref 150–400)
RBC: 4.59 MIL/uL (ref 3.87–5.11)
RDW: 14.3 % (ref 11.5–15.5)
WBC: 9 10*3/uL (ref 4.0–10.5)

## 2017-04-07 LAB — APTT: aPTT: 28 seconds (ref 24–36)

## 2017-04-07 LAB — TSH: TSH: 2.862 u[IU]/mL (ref 0.350–4.500)

## 2017-04-07 LAB — GLUCOSE, CAPILLARY: Glucose-Capillary: 293 mg/dL — ABNORMAL HIGH (ref 65–99)

## 2017-04-07 LAB — CBG MONITORING, ED: Glucose-Capillary: 319 mg/dL — ABNORMAL HIGH (ref 65–99)

## 2017-04-07 LAB — I-STAT TROPONIN, ED: Troponin i, poc: 0 ng/mL (ref 0.00–0.08)

## 2017-04-07 LAB — PROTIME-INR
INR: 0.99
Prothrombin Time: 13 seconds (ref 11.4–15.2)

## 2017-04-07 MED ORDER — HYDRALAZINE HCL 20 MG/ML IJ SOLN
10.0000 mg | Freq: Three times a day (TID) | INTRAMUSCULAR | Status: DC | PRN
  Administered 2017-04-08: 10 mg via INTRAVENOUS
  Filled 2017-04-07: qty 1

## 2017-04-07 MED ORDER — INSULIN ASPART 100 UNIT/ML ~~LOC~~ SOLN
0.0000 [IU] | Freq: Three times a day (TID) | SUBCUTANEOUS | Status: DC
  Administered 2017-04-08: 5 [IU] via SUBCUTANEOUS
  Administered 2017-04-08: 8 [IU] via SUBCUTANEOUS
  Administered 2017-04-08: 11 [IU] via SUBCUTANEOUS
  Administered 2017-04-09: 8 [IU] via SUBCUTANEOUS
  Administered 2017-04-09: 3 [IU] via SUBCUTANEOUS
  Administered 2017-04-09: 5 [IU] via SUBCUTANEOUS
  Administered 2017-04-10: 2 [IU] via SUBCUTANEOUS

## 2017-04-07 MED ORDER — ACETAMINOPHEN-CODEINE #4 300-60 MG PO TABS: 1.0000 | ORAL_TABLET | Freq: Three times a day (TID) | ORAL | Status: DC | PRN

## 2017-04-07 MED ORDER — IPRATROPIUM-ALBUTEROL 0.5-2.5 (3) MG/3ML IN SOLN
3.0000 mL | Freq: Four times a day (QID) | RESPIRATORY_TRACT | Status: DC
  Administered 2017-04-07 (×2): 3 mL via RESPIRATORY_TRACT
  Filled 2017-04-07 (×2): qty 3

## 2017-04-07 MED ORDER — NORTRIPTYLINE HCL 25 MG PO CAPS
75.0000 mg | ORAL_CAPSULE | Freq: Every day | ORAL | Status: DC
  Filled 2017-04-07: qty 3

## 2017-04-07 MED ORDER — AMLODIPINE BESYLATE 5 MG PO TABS: 10.0000 mg | ORAL_TABLET | Freq: Every day | ORAL | Status: DC

## 2017-04-07 MED ORDER — HYDROCODONE-ACETAMINOPHEN 5-325 MG PO TABS
1.0000 | ORAL_TABLET | ORAL | Status: DC | PRN
  Administered 2017-04-09 (×2): 2 via ORAL
  Filled 2017-04-07 (×2): qty 2

## 2017-04-07 MED ORDER — ACETAMINOPHEN 325 MG PO TABS: 650.0000 mg | ORAL_TABLET | Freq: Four times a day (QID) | ORAL | Status: DC | PRN

## 2017-04-07 MED ORDER — ENOXAPARIN SODIUM 40 MG/0.4ML ~~LOC~~ SOLN: 40.0000 mg | SUBCUTANEOUS | Status: DC

## 2017-04-07 MED ORDER — STROKE: EARLY STAGES OF RECOVERY BOOK
Freq: Once | Status: AC
  Administered 2017-04-07: 22:00:00
  Filled 2017-04-07 (×2): qty 1

## 2017-04-07 MED ORDER — ASPIRIN 325 MG PO TABS: 325.0000 mg | ORAL_TABLET | Freq: Every day | ORAL | Status: DC

## 2017-04-07 MED ORDER — INSULIN GLARGINE 100 UNIT/ML ~~LOC~~ SOLN
55.0000 [IU] | Freq: Every day | SUBCUTANEOUS | Status: DC
  Administered 2017-04-07 – 2017-04-09 (×3): 55 [IU] via SUBCUTANEOUS
  Filled 2017-04-07 (×4): qty 0.55

## 2017-04-07 MED ORDER — DULOXETINE HCL 60 MG PO CPEP
60.0000 mg | ORAL_CAPSULE | Freq: Every day | ORAL | Status: DC
Start: 2017-04-08 — End: 2017-04-10
  Administered 2017-04-08 – 2017-04-10 (×3): 60 mg via ORAL
  Filled 2017-04-07 (×3): qty 1

## 2017-04-07 MED ORDER — POLYETHYLENE GLYCOL 3350 17 G PO PACK: 17.0000 g | PACK | Freq: Every day | ORAL | Status: DC | PRN

## 2017-04-07 MED ORDER — ALUMINUM CHLORIDE 20 % EX SOLN: Freq: Every day | CUTANEOUS | Status: DC

## 2017-04-07 MED ORDER — ATORVASTATIN CALCIUM 40 MG PO TABS
40.0000 mg | ORAL_TABLET | Freq: Every day | ORAL | Status: DC
  Administered 2017-04-08 – 2017-04-09 (×2): 40 mg via ORAL
  Filled 2017-04-07 (×3): qty 1

## 2017-04-07 MED ORDER — INSULIN ASPART 100 UNIT/ML FLEXPEN: 13.0000 [IU] | PEN_INJECTOR | Freq: Three times a day (TID) | SUBCUTANEOUS | Status: DC

## 2017-04-07 MED ORDER — IPRATROPIUM-ALBUTEROL 0.5-2.5 (3) MG/3ML IN SOLN: 3.0000 mL | Freq: Four times a day (QID) | RESPIRATORY_TRACT | Status: DC | PRN

## 2017-04-07 MED ORDER — INSULIN ASPART 100 UNIT/ML ~~LOC~~ SOLN
0.0000 [IU] | Freq: Every day | SUBCUTANEOUS | Status: DC
  Administered 2017-04-07: 3 [IU] via SUBCUTANEOUS
  Administered 2017-04-08: 2 [IU] via SUBCUTANEOUS

## 2017-04-07 MED ORDER — ZOLPIDEM TARTRATE 5 MG PO TABS
5.0000 mg | ORAL_TABLET | Freq: Every day | ORAL | Status: DC
  Administered 2017-04-08 – 2017-04-09 (×2): 5 mg via ORAL
  Filled 2017-04-07 (×2): qty 1

## 2017-04-07 MED ORDER — LORAZEPAM 2 MG/ML IJ SOLN
2.0000 mg | Freq: Once | INTRAMUSCULAR | Status: AC
  Administered 2017-04-07: 2 mg via INTRAVENOUS
  Filled 2017-04-07: qty 1

## 2017-04-07 MED ORDER — HYDRALAZINE HCL 20 MG/ML IJ SOLN
10.0000 mg | Freq: Once | INTRAMUSCULAR | Status: AC
  Administered 2017-04-07: 10 mg via INTRAVENOUS

## 2017-04-07 MED ORDER — SODIUM CHLORIDE 0.9 % IV SOLN
INTRAVENOUS | Status: AC
  Administered 2017-04-07: 20:00:00 via INTRAVENOUS

## 2017-04-07 MED ORDER — ACETAMINOPHEN 650 MG RE SUPP: 650.0000 mg | Freq: Four times a day (QID) | RECTAL | Status: DC | PRN

## 2017-04-07 MED ORDER — ASPIRIN 300 MG RE SUPP
300.0000 mg | Freq: Every day | RECTAL | Status: DC
  Administered 2017-04-07: 300 mg via RECTAL
  Filled 2017-04-07: qty 1

## 2017-04-07 MED ORDER — AMPICILLIN-SULBACTAM SODIUM 3 (2-1) G IJ SOLR
3.0000 g | Freq: Three times a day (TID) | INTRAMUSCULAR | Status: DC
  Administered 2017-04-07 – 2017-04-08 (×3): 3 g via INTRAVENOUS
  Filled 2017-04-07 (×6): qty 3

## 2017-04-07 NOTE — Progress Notes (Signed)
Pharmacy Antibiotic Note  [name] is a 52 y.o. female admitted on 04/07/2017 with aspiration PNA.  Pharmacy has been consulted for Unasyn dosing.  Scr elevated to 2.5  Plan: 1. Unasyn 3 g q 8 hrs for now.  If renal function improves could change to q 6 hrs.  Height: 5\' 10"  (177.8 cm) Weight: 220 lb (99.8 kg) IBW/kg (Calculated) : 68.5  Temp (24hrs), Avg:100.1 F (37.8 C), Min:99.5 F (37.5 C), Max:100.7 F (38.2 C)  Recent Labs  Lab 04/07/17 1148 04/07/17 1329  WBC 9.0  --   CREATININE 2.55* 2.50*    Estimated Creatinine Clearance: 34.8 mL/min (A) (by C-G formula based on SCr of 2.5 mg/dL (H)).    Allergies  Allergen Reactions  . Lidocaine Itching  . Latex Rash    Thank you for allowing pharmacy to be a part of this patient's care.  Uvaldo Rising, BCPS  Clinical Pharmacist Pager [phone]  04/07/2017 6:59 PM

## 2017-04-07 NOTE — ED Notes (Signed)
Patient transported to MRI 

## 2017-04-07 NOTE — Progress Notes (Signed)
Patient arrived to 3W31 from the ED at this time. Safety precautions and orders reviewed with patient/family. TELE applied and confirmed. No other distress voice. Will continue to monitor.   Ave Filter, RN

## 2017-04-07 NOTE — Progress Notes (Signed)
RT instructed patient and family on the use of incentive spirometer.  Patient able to reach 1250 mL using incentive spirometer.

## 2017-04-07 NOTE — H&P (Signed)
History and Physical    [name] TOI:[id number] DOB: 01/22/1966 DOA: 04/07/2017  PCP: Ladell Pier, MD Consultants:  Neurololgy Patient coming from: Home - lives with ; [facility]:   Chief Complaint: right sided weakness and slurred speech  HPI: [name] is a 52 y.o. female with medical history significant of DM HTN, GERD, ongoing tobacco abuse, anxiety and depression who presented to the ED after she sustained A fall d/t right leg weakness. Patient also noted last night that her speech was a little and the whole right sde of the body felt weak. She also c/o cough and SOB  ED Course: VS revealed BP 220/91 on arrival and patient stated that ussally her systolic BP stays in 833'A. The rest of the VS were stable except slight elevation of temperature to 99.5. She underwent head CT that was not revealing of stroke or hemorrhages Blood work demonstrated abnormal renal function and mild electrolyte imbalance with hypocalcemia and hyponatremia  Review of Systems: As per HPI; otherwise review of systems reviewed and negative.   Ambulatory Status: Ambulates without assistance  Past Medical History:  Diagnosis Date  . Anxiety 2002  . Chest tightness   . Depression 2001  . Diabetes type 1, uncontrolled ([place])    at age 67  . Diabetic neuropathy ([place])   . Essential hypertension 2015  . GERD (gastroesophageal reflux disease)    about age of 35  . Nausea and vomiting in adult   . Urinary frequency   . Yeast vaginitis     Past Surgical History:  Procedure Laterality Date  . ACHILLES TENDON SURGERY Left 03/10/2013   Procedure: LEFT CHOPART AMPUTATION/ LEFT TENDON ACHILLES [place];  Surgeon: Wylene Simmer, MD;  Location: [facility];  Service: Orthopedics;  Laterality: Left;  . AMPUTATION Left 06/15/2012   Procedure: AMPUTATION DIGIT;  Surgeon: Meredith Pel, MD;  Location: [place];  Service: Orthopedics;  Laterality: Left;  Left great toe revision amputation  . ENDOMETRIAL ABLATION     . LAPAROSCOPIC CHOLECYSTECTOMY  01/30/2015   [facility] day surgery     Social History   Socioeconomic History  . Marital status: Divorced    Spouse name: Not on file  . Number of children: 1  . Years of education: Not on file  . Highest education level: Not on file  Social Needs  . Financial resource strain: Not on file  . Food insecurity - worry: Not on file  . Food insecurity - inability: Not on file  . Transportation needs - medical: Not on file  . Transportation needs - non-medical: Not on file  Occupational History    Employer: [employer]  Tobacco Use  . Smoking status: Light Tobacco Smoker    Packs/day: 0.50    Years: 22.00    Pack years: 11.00    Types: Cigarettes  . Smokeless tobacco: Never Used  Substance and Sexual Activity  . Alcohol use: Yes    Alcohol/week: 0.0 oz    Comment: sociial  . Drug use: No  . Sexual activity: Yes    Partners: Male    Birth control/protection: IUD  Other Topics Concern  . Not on file  Social History Narrative  . Not on file    Allergies  Allergen Reactions  . Lidocaine Itching  . Latex Rash    Family History  Problem Relation Age of Onset  . Diabetes Mother   . Heart attack Mother   . Heart disease Mother  before age 35  . Hypertension Mother   . Hyperlipidemia Mother   . Diabetes Father   . Heart attack Father   . Heart disease Father        before age 81  . Diabetes Sister   . Hypertension Sister     Prior to Admission medications   Medication Sig Start Date End Date Taking? Authorizing Provider  acetaminophen-codeine (TYLENOL #4) 300-60 MG tablet TAKE ONE TABLET BY MOUTH EVERY 8 HOURS AS NEEDED MOD  PAIN 10/03/16   Funches, Adriana Mccallum, MD  aluminum chloride (DRYSOL) 20 % external solution Apply topically at bedtime. 09/01/16   Funches, Adriana Mccallum, MD  amLODipine (NORVASC) 10 MG tablet Take 1 tablet (10 mg total) by mouth daily. 10/03/16   Boykin Nearing, MD  Blood Glucose Monitoring Suppl (ACCU-CHEK AVIVA PLUS)  W/DEVICE KIT CHECK BLOOD SUGARS THREE TIMES A DAY.  DX:250.03 07/04/13   Elayne Snare, MD  calcium citrate-vitamin D 500-400 MG-UNIT chewable tablet Chew 1 tablet by mouth 2 (two) times daily. 05/09/14   Funches, Adriana Mccallum, MD  Cholecalciferol ([employer] VIT D 5000 HIGH-POTENCY) 5000 units capsule Take 5,000 Units by mouth daily.    [provider]  cyclobenzaprine (FLEXERIL) 10 MG tablet Take 1 tablet (10 mg total) by mouth 3 (three) times daily as needed for muscle spasms. 10/03/16   Funches, Adriana Mccallum, MD  diphenoxylate-atropine (LOMOTIL) 2.5-0.025 MG tablet Take 2 tablets by mouth 3 (three) times daily before meals. Please take thirty (30) minutes prior to meal. 01/30/17   Jegede, Olugbemiga E, MD  DULoxetine (CYMBALTA) 60 MG capsule Take 1 capsule (60 mg total) by mouth daily. 10/03/16   Funches, Adriana Mccallum, MD  fluticasone (FLONASE) 50 MCG/ACT nasal spray Place 2 sprays into both nostrils daily. 06/26/16   Argentina Donovan, PA-C  glucose blood (ACCU-CHEK AVIVA PLUS) test strip USE TO CHECK BLOOD SUGAR BEFORE MEALS AND AT BEDTIME dx code E10.65 12/29/14   Boykin Nearing, MD  hydrochlorothiazide (HYDRODIURIL) 25 MG tablet Take 1 tablet (25 mg total) by mouth daily. 02/16/17   Ladell Pier, MD  insulin aspart (NOVOLOG FLEXPEN) 100 UNIT/ML FlexPen Inject 13 Units into the skin 3 (three) times daily with meals. 02/16/17   Ladell Pier, MD  Insulin Glargine (LANTUS SOLOSTAR) 100 UNIT/ML Solostar Pen INJECT 55 UNITS SUBCUTANEOUSLY ONCE DAILY 02/16/17   Ladell Pier, MD  lisinopril (PRINIVIL,ZESTRIL) 40 MG tablet Take 1 tablet (40 mg total) by mouth daily. 10/03/16   Funches, Adriana Mccallum, MD  nortriptyline (PAMELOR) 75 MG capsule Take 1 capsule (75 mg total) by mouth at bedtime. 01/28/17   Tresa Garter, MD  promethazine (PHENERGAN) 25 MG tablet Take 1 tablet (25 mg total) by mouth every 8 (eight) hours as needed for nausea or vomiting. 10/03/16   Funches, Adriana Mccallum, MD  sitaGLIPtin (JANUVIA) 50 MG  tablet Take 1 tablet (50 mg total) by mouth daily. 01/30/17   Tresa Garter, MD  SUMAtriptan (IMITREX) 50 MG tablet Take 1 tablet (50 mg total) by mouth every 2 (two) hours as needed for migraine. May repeat in 2 hours if headache persists or recurs. 07/25/16   Funches, Adriana Mccallum, MD  zolpidem (AMBIEN) 5 MG tablet TAKE ONE TABLET BY MOUTH ONCE DAILY AT BEDTIME AS NEEDED FOR SLEEP 01/30/17   Tresa Garter, MD    Physical Exam: Vitals:   04/07/17 1123 04/07/17 1230 04/07/17 1300 04/07/17 1315  BP:  (!) 211/87 (!) 207/75   Pulse:  72  75  Resp:    Marland Kitchen)  23  Temp:      TempSrc:      SpO2:  94%  96%  Weight: 99.8 kg (220 lb)     Height: '5\' 10"'  (1.778 m)        General:  Appears calm and comfortable Eyes: PERRL, normal lids, irises & conjunctiva ENT: grossly normal hearing, lips & tongue Neck: no LAD, masses or thyromegaly Cardiovascular: RRR, no m/r/g. No LE edema. Telemetry: SR, no arrhythmias  Respiratory: CTA bilaterally, no w/r/r. Normal respiratory effort. Abdomen: soft, ntnd Skin: no rash or induration seen on limited exam Musculoskeletal: grossly normal tone BUE/BLE Psychiatric: grossly normal mood and affect, speech fluent and appropriate Neurologic: grossly non-focal.        Radiological Exams on Admission: Dg Chest 2 View  Result Date: 04/07/2017 CLINICAL DATA:  Acute facial droop, slurred speech and right-sided weakness for 1 day. Cough, shortness of breath and fever for 3 days. EXAM: CHEST  2 VIEW COMPARISON:  06/14/2012 radiographs FINDINGS: The cardiomediastinal silhouette is unremarkable. Peribronchial thickening has increased. There is no evidence of focal airspace disease, pulmonary edema, suspicious pulmonary nodule/mass, pleural effusion, or pneumothorax. No acute bony abnormalities are identified. IMPRESSION: 1. Slightly increased peribronchial thickening from 2014 without other significant abnormality. Electronically Signed   By: Margarette Canada M.D.   On:  04/07/2017 13:01   Ct Head Wo Contrast  Result Date: 04/07/2017 CLINICAL DATA:  Right-sided weakness with facial droop and slurred speech. EXAM: CT HEAD WITHOUT CONTRAST TECHNIQUE: Contiguous axial images were obtained from the base of the skull through the vertex without intravenous contrast. COMPARISON:  None. FINDINGS: Brain: No evidence of acute infarction, hemorrhage, hydrocephalus, extra-axial collection or mass lesion/mass effect. 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 Mild presumed chronic small vessel ischemic type change around the lateral ventricles. Vascular: No hyperdense vessel or unexpected calcification. Skull: Normal. Negative for fracture or focal lesion. Sinuses/Orbits: No acute finding. Other: Call has been placed to Dr. Leonel Ramsay. IMPRESSION: 1. No acute finding. 2. Mild chronic small vessel ischemic change. Electronically Signed   By: Monte Fantasia M.D.   On: 04/07/2017 12:16    EKG: Independently reviewed.  NSR with rate 78, QS in the inferior leads suggestive of MI of undetermined age   Labs on Admission: I have personally reviewed the available labs and imaging studies at the time of the admission.  Pertinent labs:  BUN 26, Creat. 2.50,  T Chol 275, Trigl 677    Assessment/Plan Principal Problem:   Acute right-sided weakness Active Problems:   TOBACCO DEPENDENCE   Essential hypertension, benign   Diabetes mellitus type 2, uncontrolled, with complications (HCC)   CKD stage 3 due to type 2 diabetes mellitus (H[facility])   Slurred speech    Right-sided weakness and slurred speech - onset last night and is suspicious for stroke. Weakness resolved but the patient still has facial droop and very slurred speech. She has been seen by neurology, head CT is negative for acute findings, MRI/MRA without contrast ordered Lipid panel received elevated total cholesterol 273 and a high triglycerides at  677 While order echocardiogram, continue monitoring,  Keep patient n.p.o. until she passes swallow screen   HTN with hypertensive emergency on presentation She was given Hydralazine IV in the ED  Consider permissive elevation in BP given high suspicion for stroke Will restart home meds   Acute exacerbation of CKD -  Creatinine 2.55; was 2.59 in December 2018 and 1.66  in April 2018 Consider gentle hydration, avoid toxic drug, hold Hydrouril  DM  - last Hgb 13.3 in December 2018 Continue, continue home doses of Insulin, hold oral Januvia Add SSI, monitor FSBS  Cough in patient with long history of tobacco use - mos likely COPD exacerbation Will add nebulizer treatment, expectorant, three day course of Zithromax Tobacco cessation encouraged Needs outpatient PFT studies  Depression/anxiety - will continue Cymbalta and Northiptyline   DVT prophylaxis: Lovenox  Code Status:  Full - confirmed with patient/family Family Communication:  At bedside Disposition Plan: Home once clinically improved Consults called: Neuro  Admission status: Inpatient   York Grice PA-C [phone] [facility]  If note is complete, please contact covering daytime or nighttime physician. [email or web address] Password [email or web address]  04/07/2017, 2:00 PM

## 2017-04-07 NOTE — ED Notes (Signed)
Family at bedside. 

## 2017-04-07 NOTE — Progress Notes (Signed)
Inpatient Diabetes Program Recommendations  AACE/ADA: New Consensus Statement on Inpatient Glycemic Control (2015)  Target Ranges:  Prepandial:   less than 140 mg/dL      Peak postprandial:   less than 180 mg/dL (1-2 hours)      Critically ill patients:  140 - 180 mg/dL   Review of Glycemic Control  Diabetes history: DM 2 Outpatient Diabetes medications: Lantus 55 units, Novolog 13 units tid Current orders for Inpatient glycemic control:  Lantus 55 units, Novolog 13 units tid  Inpatient Diabetes Program Recommendations:    Current A1c ordered, Last A1c 13.3% on 12/17. Admitting glucose in the 300's.  Patient is currently NPO. Please reduce home dose of insulin while inpatient: Lantus 20 units, Novolog Sensitive Correction 0-9 units tid + HS scale 0-5 units.  Thanks,  Tama Headings RN, MSN, [facility] Inpatient Diabetes Coordinator Team Pager [phone] (8a-5p)

## 2017-04-07 NOTE — ED Triage Notes (Signed)
Pt presents with facial droop, slurred speech with R sided weakness that began last night.  Pt reports falling due to weakness.

## 2017-04-07 NOTE — ED Provider Notes (Signed)
[facility] EMERGENCY DEPARTMENT Provider Note   CSN: [id number] Arrival date & time: 04/07/17  1113     History   Chief Complaint No chief complaint on file.   HPI [name] is a 52 y.o. female with history of hypertension, type 1 diabetes who presents with slurred speech, loss of balance, weakness on the right side, and numbness and tingling.  Patient felt weak in the right side and fell last evening.  Her symptoms have been constant since then.  She denies vision changes.  She denies any headache, chest pain.  She has had some shortness of breath and cough for the past few days.  She smokes 2 packs a day.  She denies any abdominal pain, nausea, vomiting.  She reports having a weak bladder.  HPI  Past Medical History:  Diagnosis Date  . Anxiety 2002  . Chest tightness   . Depression 2001  . Diabetes type 1, uncontrolled ([place])    at age 88  . Diabetic neuropathy ([place])   . Essential hypertension 2015  . GERD (gastroesophageal reflux disease)    about age of 37  . Nausea and vomiting in adult   . Urinary frequency   . Yeast vaginitis     Patient Active Problem List   Diagnosis Date Noted  . Acute right-sided weakness 04/07/2017  . Slurred speech 04/07/2017  . Right sided weakness 04/07/2017  . Hyperhidrosis 09/01/2016  . Migraine with aura and without status migrainosus, not intractable 07/28/2016  . 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 CKD stage 3 due to type 2 diabetes mellitus ([place]) 06/27/2014  . Vitamin D insufficiency 05/08/2014  . Obesity (BMI 30.0-34.9) 05/08/2014  . 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 Bursitis of left shoulder 02/14/2014  . Neck pain 01/03/2014  . Diabetes mellitus type 2, uncontrolled, with complications ([place]) 85/88/5027  . 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.23) 01/14/2013  . Insomnia 08/04/2012  . Diabetic neuropathy, painful ([place])  08/04/2011  . Anxiety and depression 05/16/2010  . Essential hypertension, benign 11/01/2007  . Female stress incontinence 11/01/2007  . TOBACCO DEPENDENCE 04/30/2006  . GASTROESOPHAGEAL REFLUX, NO ESOPHAGITIS 04/30/2006  . Irritable bowel syndrome 04/30/2006    Past Surgical History:  Procedure Laterality Date  . ACHILLES TENDON SURGERY Left 03/10/2013   Procedure: LEFT CHOPART AMPUTATION/ LEFT TENDON ACHILLES [place];  Surgeon: Wylene Simmer, MD;  Location: [facility];  Service: Orthopedics;  Laterality: Left;  . AMPUTATION Left 06/15/2012   Procedure: AMPUTATION DIGIT;  Surgeon: Meredith Pel, MD;  Location: [facility];  Service: Orthopedics;  Laterality: Left;  Left great toe revision amputation  . ENDOMETRIAL ABLATION    . LAPAROSCOPIC CHOLECYSTECTOMY  01/30/2015   [facility] day surgery     OB History    No data available       Home Medications    Prior to Admission medications   Medication Sig Start Date End Date Taking? Authorizing Provider  acetaminophen-codeine (TYLENOL #4) 300-60 MG tablet TAKE ONE TABLET BY MOUTH EVERY 8 HOURS AS NEEDED MOD  PAIN 10/03/16   Funches, Adriana Mccallum, MD  aluminum chloride (DRYSOL) 20 % external solution Apply topically at bedtime. 09/01/16   Funches, Adriana Mccallum, MD  amLODipine (NORVASC) 10 MG tablet Take 1 tablet (10 mg total) by mouth daily. 10/03/16   Boykin Nearing, MD  Blood Glucose Monitoring Suppl (ACCU-CHEK AVIVA PLUS) W/DEVICE KIT CHECK BLOOD SUGARS THREE TIMES A DAY.  DX:250.03 07/04/13  Elayne Snare, MD  calcium citrate-vitamin D 500-400 MG-UNIT chewable tablet Chew 1 tablet by mouth 2 (two) times daily. 05/09/14   Funches, Adriana Mccallum, MD  Cholecalciferol ([employer] VIT D 5000 HIGH-POTENCY) 5000 units capsule Take 5,000 Units by mouth daily.    [provider]  cyclobenzaprine (FLEXERIL) 10 MG tablet Take 1 tablet (10 mg total) by mouth 3 (three) times daily as needed for muscle spasms. 10/03/16   Funches, Adriana Mccallum, MD  diphenoxylate-atropine (LOMOTIL)  2.5-0.025 MG tablet Take 2 tablets by mouth 3 (three) times daily before meals. Please take thirty (30) minutes prior to meal. 01/30/17   Jegede, Olugbemiga E, MD  DULoxetine (CYMBALTA) 60 MG capsule Take 1 capsule (60 mg total) by mouth daily. 10/03/16   Funches, Adriana Mccallum, MD  fluticasone (FLONASE) 50 MCG/ACT nasal spray Place 2 sprays into both nostrils daily. 06/26/16   Argentina Donovan, PA-C  glucose blood (ACCU-CHEK AVIVA PLUS) test strip USE TO CHECK BLOOD SUGAR BEFORE MEALS AND AT BEDTIME dx code E10.65 12/29/14   Boykin Nearing, MD  hydrochlorothiazide (HYDRODIURIL) 25 MG tablet Take 1 tablet (25 mg total) by mouth daily. 02/16/17   Ladell Pier, MD  insulin aspart (NOVOLOG FLEXPEN) 100 UNIT/ML FlexPen Inject 13 Units into the skin 3 (three) times daily with meals. 02/16/17   Ladell Pier, MD  Insulin Glargine (LANTUS SOLOSTAR) 100 UNIT/ML Solostar Pen INJECT 55 UNITS SUBCUTANEOUSLY ONCE DAILY 02/16/17   Ladell Pier, MD  lisinopril (PRINIVIL,ZESTRIL) 40 MG tablet Take 1 tablet (40 mg total) by mouth daily. 10/03/16   Funches, Adriana Mccallum, MD  nortriptyline (PAMELOR) 75 MG capsule Take 1 capsule (75 mg total) by mouth at bedtime. 01/28/17   Tresa Garter, MD  promethazine (PHENERGAN) 25 MG tablet Take 1 tablet (25 mg total) by mouth every 8 (eight) hours as needed for nausea or vomiting. 10/03/16   Funches, Adriana Mccallum, MD  sitaGLIPtin (JANUVIA) 50 MG tablet Take 1 tablet (50 mg total) by mouth daily. 01/30/17   Tresa Garter, MD  SUMAtriptan (IMITREX) 50 MG tablet Take 1 tablet (50 mg total) by mouth every 2 (two) hours as needed for migraine. May repeat in 2 hours if headache persists or recurs. 07/25/16   Funches, Adriana Mccallum, MD  zolpidem (AMBIEN) 5 MG tablet TAKE ONE TABLET BY MOUTH ONCE DAILY AT BEDTIME AS NEEDED FOR SLEEP 01/30/17   Tresa Garter, MD    Family History Family History  Problem Relation Age of Onset  . Diabetes Mother   . Heart attack Mother   .  Heart disease Mother        before age 61  . Hypertension Mother   . Hyperlipidemia Mother   . Diabetes Father   . Heart attack Father   . Heart disease Father        before age 41  . Diabetes Sister   . Hypertension Sister     Social History Social History   Tobacco Use  . Smoking status: Light Tobacco Smoker    Packs/day: 0.50    Years: 22.00    Pack years: 11.00    Types: Cigarettes  . Smokeless tobacco: Never Used  Substance Use Topics  . Alcohol use: Yes    Alcohol/week: 0.0 oz    Comment: sociial  . Drug use: No     Allergies   Lidocaine and Latex   Review of Systems Review of Systems  Constitutional: Positive for chills. Negative for fever.  HENT: Negative for facial swelling and sore  throat.   Eyes: Negative for visual disturbance.  Respiratory: Positive for cough and shortness of breath.   Cardiovascular: Negative for chest pain.  Gastrointestinal: Negative for abdominal pain, nausea and vomiting.  Genitourinary: Negative for dysuria.  Musculoskeletal: Negative for back pain.  Skin: Negative for rash and wound.  Neurological: Positive for speech difficulty, weakness and numbness. Negative for headaches.  Psychiatric/Behavioral: The patient is not nervous/anxious.      Physical Exam Updated Vital Signs BP (!) 189/93   Pulse 92   Temp 99.5 F (37.5 C) (Oral)   Resp 20   Ht _0  (1.778 m)   Wt 99.8 kg (220 lb)   SpO2 93%   BMI 31.57 kg/m   Physical Exam  Constitutional: She appears well-developed and well-nourished. No distress.  HENT:  Head: Normocephalic and atraumatic.  Mouth/Throat: Oropharynx is clear and moist. No oropharyngeal exudate.  Eyes: Conjunctivae are normal. Pupils are equal, round, and reactive to light. Right eye exhibits no discharge. Left eye exhibits no discharge. No scleral icterus.  Neck: Normal range of motion. Neck supple. No thyromegaly present.  Cardiovascular: Normal rate, regular rhythm, normal heart sounds and  intact distal pulses. Exam reveals no gallop and no friction rub.  No murmur heard. Pulmonary/Chest: Effort normal. No stridor. No respiratory distress. She has no wheezes. She has rhonchi (upper lunf fields). She has no rales.  Abdominal: Soft. Bowel sounds are normal. She exhibits no distension. There is no tenderness. There is no rebound and no guarding.  Musculoskeletal: She exhibits no edema.  Lymphadenopathy:    She has no cervical adenopathy.  Neurological: She is alert. Coordination normal.  Right-sided facial droop; CN 3-12 intact; patient with slurring of speech; no expressive or receptive aphasia; normal sensation throughout; 5/5 strength left upper and lower extremities, 4/5 strength in right upper and lower extremities; weaker grip strength on the right   Skin: Skin is warm and dry. No rash noted. She is not diaphoretic. No pallor.  Psychiatric: She has a normal mood and affect.  Nursing note and vitals reviewed.    ED Treatments / Results  Labs (all labs ordered are listed, but only abnormal results are displayed) Labs Reviewed  COMPREHENSIVE METABOLIC PANEL - Abnormal; Notable for the following components:      Result Value   Sodium 134 (*)    CO2 20 (*)    Glucose, Bld 364 (*)    BUN 24 (*)    Creatinine, Ser 2.55 (*)    Calcium 8.6 (*)    Albumin 3.2 (*)    AST 45 (*)    Alkaline Phosphatase 129 (*)    GFR calc non Af Amer 21 (*)    GFR calc Af Amer 24 (*)    All other components within normal limits  CBG MONITORING, ED - Abnormal; Notable for the following components:   Glucose-Capillary 319 (*)    All other components within normal limits  I-STAT CHEM 8, ED - Abnormal; Notable for the following components:   BUN 26 (*)    Creatinine, Ser 2.50 (*)    Glucose, Bld 324 (*)    Calcium, Ion 1.10 (*)    Hemoglobin 15.3 (*)    All other components within normal limits  PROTIME-INR  APTT  CBC  DIFFERENTIAL  RAPID URINE DRUG SCREEN, HOSP PERFORMED  HIV  ANTIBODY (ROUTINE TESTING)  CBC  CREATININE, SERUM  I-STAT TROPONIN, ED    EKG  EKG Interpretation None  Radiology Dg Chest 2 View  Result Date: 04/07/2017 CLINICAL DATA:  Acute facial droop, slurred speech and right-sided weakness for 1 day. Cough, shortness of breath and fever for 3 days. EXAM: CHEST  2 VIEW COMPARISON:  06/14/2012 radiographs FINDINGS: The cardiomediastinal silhouette is unremarkable. Peribronchial thickening has increased. There is no evidence of focal airspace disease, pulmonary edema, suspicious pulmonary nodule/mass, pleural effusion, or pneumothorax. No acute bony abnormalities are identified. IMPRESSION: 1. Slightly increased peribronchial thickening from 2014 without other significant abnormality. Electronically Signed   By: Margarette Canada M.D.   On: 04/07/2017 13:01   Ct Head Wo Contrast  Result Date: 04/07/2017 CLINICAL DATA:  Right-sided weakness with facial droop and slurred speech. EXAM: CT HEAD WITHOUT CONTRAST TECHNIQUE: Contiguous axial images were obtained from the base of the skull through the vertex without intravenous contrast. COMPARISON:  None. FINDINGS: Brain: No evidence of acute infarction, hemorrhage, hydrocephalus, extra-axial collection or mass lesion/mass effect. 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 Mild presumed chronic small vessel ischemic type change around the lateral ventricles. Vascular: No hyperdense vessel or unexpected calcification. Skull: Normal. Negative for fracture or focal lesion. Sinuses/Orbits: No acute finding. Other: Call has been placed to Dr. Leonel Ramsay. IMPRESSION: 1. No acute finding. 2. Mild chronic small vessel ischemic change. Electronically Signed   By: Monte Fantasia M.D.   On: 04/07/2017 12:16   Mr Jodene Nam Head Wo Contrast  Result Date: 04/07/2017 CLINICAL DATA:  52 y/o F; slurred speech and right-sided body weakness. EXAM: MRI HEAD WITHOUT  CONTRAST MRA HEAD WITHOUT CONTRAST TECHNIQUE: Sagittal T1 FLAIR, axial DWI, coronal DWI, axial T2 sequences MRI head were acquired. Angiographic images of the head were obtained using MRA technique without contrast. COMPARISON:  04/07/2017 CT head FINDINGS: MRI HEAD FINDINGS Brain: Small foci of reduced diffusion are present within the left posterior limb of internal capsule and right splenium of corpus callosum. No focal mass effect. No hydrocephalus, extra-axial collection, or effacement of basilar cisterns identified. Motion degraded axial T2 and sagittal T1 sequences. Vascular: As below. Skull and upper cervical spine: Normal marrow signal. Sinuses/Orbits: Negative. Other: None. MRA HEAD FINDINGS Internal carotid arteries: Patent. Mild irregularity of carotid siphons compatible with atherosclerosis with mild left cavernous and right paraclinoid stenosis. 2 mm inferiorly directed outpouching of right cavernous ICA, likely prominent infundibulum of diminutive vessel. Anterior cerebral arteries:  Patent. Middle cerebral arteries: Patent. Anterior communicating artery: Patent. Posterior communicating arteries: Patent right. No left identified, likely hypoplastic or absent. Posterior cerebral arteries: Patent. Left P2 short segment of mild stenosis. Basilar artery:  Patent.  Lower basilar fenestration. Vertebral arteries:  Patent. No evidence of high-grade stenosis, large vessel occlusion, or aneurysm unless noted above. IMPRESSION: 1. Small acute/early subacute infarctions within the left posterior limb of internal capsule and right splenium of corpus callosum. 2. Patent anterior and posterior intracranial circulation. No large vessel occlusion, high-grade stenosis, or aneurysm. 3. Intracranial atherosclerosis with multiple segments of mild stenosis. These results were called by telephone at the time of interpretation on 04/07/2017 at 3:58 pm to PA Gabby Rackers , who verbally acknowledged these results. Electronically Signed    By: Kristine Garbe M.D.   On: 04/07/2017 16:03   Mr Brain Wo Contrast  Result Date: 04/07/2017 CLINICAL DATA:  52 y/o F; slurred speech and right-sided body weakness. EXAM: MRI HEAD WITHOUT CONTRAST MRA HEAD WITHOUT CONTRAST TECHNIQUE: Sagittal T1 FLAIR, axial DWI, coronal DWI, axial T2 sequences MRI head were  acquired. Angiographic images of the head were obtained using MRA technique without contrast. COMPARISON:  04/07/2017 CT head FINDINGS: MRI HEAD FINDINGS Brain: Small foci of reduced diffusion are present within the left posterior limb of internal capsule and right splenium of corpus callosum. No focal mass effect. No hydrocephalus, extra-axial collection, or effacement of basilar cisterns identified. Motion degraded axial T2 and sagittal T1 sequences. Vascular: As below. Skull and upper cervical spine: Normal marrow signal. Sinuses/Orbits: Negative. Other: None. MRA HEAD FINDINGS Internal carotid arteries: Patent. Mild irregularity of carotid siphons compatible with atherosclerosis with mild left cavernous and right paraclinoid stenosis. 2 mm inferiorly directed outpouching of right cavernous ICA, likely prominent infundibulum of diminutive vessel. Anterior cerebral arteries:  Patent. Middle cerebral arteries: Patent. Anterior communicating artery: Patent. Posterior communicating arteries: Patent right. No left identified, likely hypoplastic or absent. Posterior cerebral arteries: Patent. Left P2 short segment of mild stenosis. Basilar artery:  Patent.  Lower basilar fenestration. Vertebral arteries:  Patent. No evidence of high-grade stenosis, large vessel occlusion, or aneurysm unless noted above. IMPRESSION: 1. Small acute/early subacute infarctions within the left posterior limb of internal capsule and right splenium of corpus callosum. 2. Patent anterior and posterior intracranial circulation. No large vessel occlusion, high-grade stenosis, or aneurysm. 3. Intracranial atherosclerosis with  multiple segments of mild stenosis. These results were called by telephone at the time of interpretation on 04/07/2017 at 3:58 pm to PA Arriyana Rodell , who verbally acknowledged these results. Electronically Signed   By: Kristine Garbe M.D.   On: 04/07/2017 16:03    Procedures Procedures (including critical care time)  CRITICAL CARE Performed by: Frederica Kuster   Total critical care time: 30 minutes  Critical care time was exclusive of separately billable procedures and treating other patients.  Critical care was necessary to treat or prevent imminent or life-threatening deterioration.  Critical care was time spent personally by me on the following activities: development of treatment plan with patient and/or surrogate as well as nursing, discussions with consultants, evaluation of patient's response to treatment, examination of patient, obtaining history from patient or surrogate, ordering and performing treatments and interventions, ordering and review of laboratory studies, ordering and review of radiographic studies, pulse oximetry and re-evaluation of patient's condition.   Medications Ordered in ED Medications  aluminum chloride (DRYSOL) 20 % external solution (not administered)  acetaminophen-codeine (TYLENOL #4) 300-60 MG per tablet 1 tablet (not administered)  amLODipine (NORVASC) tablet 10 mg (not administered)  DULoxetine (CYMBALTA) DR capsule 60 mg (not administered)  insulin aspart (NOVOLOG) FlexPen 13 Units (not administered)  Insulin Glargine (LANTUS) Solostar Pen 55 Units (not administered)  nortriptyline (PAMELOR) capsule 75 mg (not administered)  zolpidem (AMBIEN) tablet 5 mg (not administered)   stroke: mapping our early stages of recovery book (not administered)  aspirin suppository 300 mg (not administered)    Or  aspirin tablet 325 mg (not administered)  enoxaparin (LOVENOX) injection 30 mg (not administered)  ipratropium-albuterol (DUONEB) 0.5-2.5 (3) MG/3ML  nebulizer solution 3 mL (3 mLs Nebulization Given 04/07/17 1554)  0.9 %  sodium chloride infusion (not administered)  acetaminophen (TYLENOL) tablet 650 mg (not administered)    Or  acetaminophen (TYLENOL) suppository 650 mg (not administered)  HYDROcodone-acetaminophen (NORCO/VICODIN) 5-325 MG per tablet 1-2 tablet (not administered)  polyethylene glycol (MIRALAX / GLYCOLAX) packet 17 g (not administered)  hydrALAZINE (APRESOLINE) injection 10 mg (10 mg Intravenous Given 04/07/17 1329)  LORazepam (ATIVAN) injection 2 mg (2 mg Intravenous Given 04/07/17 1441)     Initial  Impression / Assessment and Plan / ED Course  I have reviewed the triage vital signs and the nursing notes.  Pertinent labs & imaging results that were available during my care of the patient were reviewed by me and considered in my medical decision making (see chart for details).     Patient with CVA.  CT head is negative for acute stroke, however neurology PA-C, Etta Quill, concerned considering patient's symptoms.  They recommend admission to medicine for MRI and further workup.  CBC is negative.  CMP shows glucose 364, BUN 24, creatinine 4.55, AST 45, alk phos 129.  Chest x-ray shows slightly increased peribronchial thickening from 2014 without other significant abnormality.  Neurology recommended speech therapy with PT/OT as well.    Radiologist called to report MRI findings after patient admission showing:  1. Small acute/early subacute infarctions within the left posterior limb of internal capsule and right splenium of corpus callosum. 2. Patent anterior and posterior intracranial circulation. No large vessel occlusion, high-grade stenosis, or aneurysm. 3. Intracranial atherosclerosis with multiple segments of mild stenosis.  Patient and daughter understand and agree with plan.  I spoke with [facility], PA [place], who will admit the patient for further evaluation and treatment.  Patient also evaluated by Dr.  Ashok Cordia who guided the patient's management and agrees with plan.  Final Clinical Impressions(s) / ED Diagnoses   Final diagnoses:  Right sided weakness  Facial droop  Slurred speech  Cerebrovascular accident (CVA), unspecified mechanism [facility])    ED Discharge Orders    None       Frederica Kuster, PA-C 04/07/17 1611    Lajean Saver, MD 04/08/17 1330    Lajean Saver, MD 04/17/17 1224

## 2017-04-07 NOTE — Consult Note (Signed)
Requesting Physician: Dr. Ashok Cordia    Chief Complaint: Stroke  History obtained from:  Patient     HPI:                                                                                                                                         [name] is an 52 y.o. female with known past medical history of hypertension, diabetes who takes no antiplatelets and states that her blood pressure is usually in the 130s/90s.  Patient comes to the ED after waking up this morning and having a fall secondary to right leg weakness.  She stated that she noted that her right arm and right leg was weak and that her speech was off.  Upon entering the ED her blood pressure was 220/91, patient is dysarthric, and she shows mild weakness in the right arm and leg.  She has no sensory issues.  Currently her blood pressure still elevated at 222/88.  ED is administering.  States that she was to pack a day smoker but has weaned herself down to 1 pack a day.  Date last known well: Date: 04/06/2017 Time last known well: Time: 21:00 tPA Given: No: Out of the window Modified Rankin: Rankin Score=0 NIHSS stroke scale 3   Past Medical History:  Diagnosis Date  . Anxiety 2002  . Chest tightness   . Depression 2001  . Diabetes type 1, uncontrolled ([place])    at age 55  . Diabetic neuropathy ([place])   . Essential hypertension 2015  . GERD (gastroesophageal reflux disease)    about age of 59  . Nausea and vomiting in adult   . Urinary frequency   . Yeast vaginitis     Past Surgical History:  Procedure Laterality Date  . ACHILLES TENDON SURGERY Left 03/10/2013   Procedure: LEFT CHOPART AMPUTATION/ LEFT TENDON ACHILLES [place];  Surgeon: Wylene Simmer, MD;  Location: [facility];  Service: Orthopedics;  Laterality: Left;  . AMPUTATION Left 06/15/2012   Procedure: AMPUTATION DIGIT;  Surgeon: Meredith Pel, MD;  Location: [facility];  Service: Orthopedics;  Laterality: Left;  Left great toe revision amputation  . ENDOMETRIAL  ABLATION    . LAPAROSCOPIC CHOLECYSTECTOMY  01/30/2015   [facility] day surgery     Family History  Problem Relation Age of Onset  . Diabetes Mother   . Heart attack Mother   . Heart disease Mother        before age 69  . Hypertension Mother   . Hyperlipidemia Mother   . Diabetes Father   . Heart attack Father   . Heart disease Father        before age 52  . Diabetes Sister   . Hypertension Sister    Social History:  reports that she has been smoking cigarettes.  She has a 11.00 pack-year smoking history. she has never used smokeless tobacco. She reports  that she drinks alcohol. She reports that she does not use drugs.  Allergies:  Allergies  Allergen Reactions  . Lidocaine Itching  . Latex Rash    Medications:                                                                                                                           No current facility-administered medications for this encounter.    Current Outpatient Medications  Medication Sig Dispense Refill  . acetaminophen-codeine (TYLENOL #4) 300-60 MG tablet TAKE ONE TABLET BY MOUTH EVERY 8 HOURS AS NEEDED MOD  PAIN 90 tablet 0  . aluminum chloride (DRYSOL) 20 % external solution Apply topically at bedtime. 35 mL 5  . amLODipine (NORVASC) 10 MG tablet Take 1 tablet (10 mg total) by mouth daily. 90 tablet 3  . Blood Glucose Monitoring Suppl (ACCU-CHEK AVIVA PLUS) W/DEVICE KIT CHECK BLOOD SUGARS THREE TIMES A DAY.  DX:250.03 1 kit 0  . calcium citrate-vitamin D 500-400 MG-UNIT chewable tablet Chew 1 tablet by mouth 2 (two) times daily. 180 tablet 1  . Cholecalciferol ([employer] VIT D 5000 HIGH-POTENCY) 5000 units capsule Take 5,000 Units by mouth daily.    . cyclobenzaprine (FLEXERIL) 10 MG tablet Take 1 tablet (10 mg total) by mouth 3 (three) times daily as needed for muscle spasms. 60 tablet 3  . diphenoxylate-atropine (LOMOTIL) 2.5-0.025 MG tablet Take 2 tablets by mouth 3 (three) times daily before meals. Please take thirty (30)  minutes prior to meal. 180 tablet 5  . DULoxetine (CYMBALTA) 60 MG capsule Take 1 capsule (60 mg total) by mouth daily. 90 capsule 3  . fluticasone (FLONASE) 50 MCG/ACT nasal spray Place 2 sprays into both nostrils daily. 16 g 6  . glucose blood (ACCU-CHEK AVIVA PLUS) test strip USE TO CHECK BLOOD SUGAR BEFORE MEALS AND AT BEDTIME dx code E10.65 125 each 3  . hydrochlorothiazide (HYDRODIURIL) 25 MG tablet Take 1 tablet (25 mg total) by mouth daily. 90 tablet 3  . insulin aspart (NOVOLOG FLEXPEN) 100 UNIT/ML FlexPen Inject 13 Units into the skin 3 (three) times daily with meals. 15 mL 6  . Insulin Glargine (LANTUS SOLOSTAR) 100 UNIT/ML Solostar Pen INJECT 55 UNITS SUBCUTANEOUSLY ONCE DAILY 15 mL 3  . lisinopril (PRINIVIL,ZESTRIL) 40 MG tablet Take 1 tablet (40 mg total) by mouth daily. 90 tablet 3  . nortriptyline (PAMELOR) 75 MG capsule Take 1 capsule (75 mg total) by mouth at bedtime. 90 capsule 3  . promethazine (PHENERGAN) 25 MG tablet Take 1 tablet (25 mg total) by mouth every 8 (eight) hours as needed for nausea or vomiting. 20 tablet 0  . sitaGLIPtin (JANUVIA) 50 MG tablet Take 1 tablet (50 mg total) by mouth daily. 30 tablet 5  . SUMAtriptan (IMITREX) 50 MG tablet Take 1 tablet (50 mg total) by mouth every 2 (two) hours as needed for migraine. May repeat in 2 hours if headache persists or recurs. 10 tablet 0  . zolpidem (AMBIEN)  5 MG tablet TAKE ONE TABLET BY MOUTH ONCE DAILY AT BEDTIME AS NEEDED FOR SLEEP 30 tablet 2     ROS:                                                                                                                                       History obtained from the patient  General ROS: negative for - chills, fatigue, fever, night sweats, weight gain or weight loss Psychological ROS: negative for - , hallucinations, memory difficulties, mood swings or  Ophthalmic ROS: negative for - blurry vision, double vision, eye pain or loss of vision ENT ROS: negative for -  epistaxis, nasal discharge, oral lesions, sore throat, tinnitus or vertigo Respiratory ROS: Positive for - cough,  shortness of breath or wheezing Cardiovascular ROS: negative for - chest pain, dyspnea on exertion,  Gastrointestinal ROS: negative for - abdominal pain, diarrhea,  nausea/vomiting or stool incontinence Genito-Urinary ROS: negative for - dysuria, hematuria, incontinence or urinary frequency/urgency Musculoskeletal ROS: positive  for -toe amputation secondary to diabetes Neurological ROS: as noted in HPI   General Examination:                                                                                                      Blood pressure (!) 220/91, pulse 79, temperature 99.5 F (37.5 C), temperature source Oral, resp. rate 20, height '5\' 10"'  (1.778 m), weight 99.8 kg (220 lb), SpO2 96 %.  HEENT-  Normocephalic, no lesions, without obvious abnormality.  Normal external eye and conjunctiva.   Cardiovascular- S1-S2 audible, pulses palpable throughout   Lungs-positive for wheezing  abdomen- All 4 quadrants palpated and nontender Extremities- Warm, dry and intact  Musculoskeletal-recent left toe amputation secondary to diabetes  skin-warm and dry, no hyperpigmentation, vitiligo, or suspicious lesions  Neurological Examination Mental Status: Alert, oriented, thought content appropriate.  Speech significantly dysarthric without evidence of aphasia.  Able to follow 3 step commands without difficulty. Cranial Nerves: II: ; Visual fields grossly normal,  III,IV, VI: ptosis not present, extra-ocular motions intact bilaterally, pupils equal, round, reactive to light and accommodation V,VII: smile asymmetric on the right, facial light touch sensation normal bilaterally VIII: hearing normal bilaterally IX,X: uvula rises symmetrically XI: bilateral shoulder shrug XII: midline tongue extension Motor: Right : Upper extremity   4/5    Left:     Upper extremity   5/5  Lower extremity    2/5     Lower extremity   5/5 Tone and  bulk:normal tone throughout; no atrophy noted Sensory: Pinprick and light touch intact throughout, bilaterally Deep Tendon Reflexes: 2+ and symmetric throughout Plantars: Right: downgoing   Left: Amputation with Unna boot on Cerebellar: normal finger-to-nose,and normal heel-to-shin test Gait: Not tested   Lab Results: Basic Metabolic Panel: No results for input(s): NA, K, CL, CO2, GLUCOSE, BUN, CREATININE, CALCIUM, MG, PHOS in the last 168 hours.  CBC: Recent Labs  Lab 04/07/17 1148  WBC 9.0  NEUTROABS 6.6  HGB 12.7  HCT 39.6  MCV 86.3  PLT 239    Lipid Panel: No results for input(s): CHOL, TRIG, HDL, CHOLHDL, VLDL, LDLCALC in the last 168 hours.  CBG: Recent Labs  Lab 04/07/17 1131  GLUCAP 319*    Imaging: Ct Head Wo Contrast  Result Date: 04/07/2017 CLINICAL DATA:  Right-sided weakness with facial droop and slurred speech. EXAM: CT HEAD WITHOUT CONTRAST TECHNIQUE: Contiguous axial images were obtained from the base of the skull through the vertex without intravenous contrast. COMPARISON:  None. FINDINGS: Brain: No evidence of acute infarction, hemorrhage, hydrocephalus, extra-axial collection or mass lesion/mass effect. 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 Mild presumed chronic small vessel ischemic type change around the lateral ventricles. Vascular: No hyperdense vessel or unexpected calcification. Skull: Normal. Negative for fracture or focal lesion. Sinuses/Orbits: No acute finding. Other: Call has been placed to Dr. Leonel Ramsay. IMPRESSION: 1. No acute finding. 2. Mild chronic small vessel ischemic change. Electronically Signed   By: Monte Fantasia M.D.   On: 04/07/2017 12:16    Assessment and plan discussed with with attending physician and they are in agreement.    Etta Quill PA-C [facility] [phone]  04/07/2017, 12:26 PM    Assessment: 52 y.o. female presented to the emergency department with hypertensive urgency, dysarthria, right arm and leg weakness with negative CT scan for any acute bleed or infarct.  Last known normal at 2100 hrs. last night.  Patient is on no antiplatelet and smokes 1 pack a day.  Stroke Risk Factors - diabetes mellitus, hypertension and smoking  Recommend 1. HgbA1c, fasting lipid panel 2. MRI/MRA of the brain without contrast 3. PT consult, OT consult, Speech consult 4. Echocardiogram 5. 80 mg of Atorvistatin 6. Prophylactic therapy-Antiplatelet med: Aspirin 325 mg daily 7. Risk factor modification 8. Telemetry monitoring 9. Frequent neuro checks 10 NPO until passes stroke swallow screen 11 please page stroke NP  Or  PA  Or MD from 8am -4 pm  as this patient from this time will be  followed by the stroke.   You can look them up on [email or web address]  Password [email or web address]

## 2017-04-08 ENCOUNTER — Inpatient Hospital Stay (HOSPITAL_COMMUNITY): Payer: Medicaid Other

## 2017-04-08 DIAGNOSIS — I5030 Unspecified diastolic (congestive) heart failure: Secondary | ICD-10-CM

## 2017-04-08 DIAGNOSIS — N183 Chronic kidney disease, stage 3 (moderate): Secondary | ICD-10-CM

## 2017-04-08 DIAGNOSIS — I6330 Cerebral infarction due to thrombosis of unspecified cerebral artery: Secondary | ICD-10-CM

## 2017-04-08 DIAGNOSIS — E1122 Type 2 diabetes mellitus with diabetic chronic kidney disease: Secondary | ICD-10-CM

## 2017-04-08 DIAGNOSIS — E1165 Type 2 diabetes mellitus with hyperglycemia: Secondary | ICD-10-CM

## 2017-04-08 DIAGNOSIS — R2990 Unspecified symptoms and signs involving the nervous system: Secondary | ICD-10-CM

## 2017-04-08 DIAGNOSIS — E118 Type 2 diabetes mellitus with unspecified complications: Secondary | ICD-10-CM

## 2017-04-08 DIAGNOSIS — I10 Essential (primary) hypertension: Secondary | ICD-10-CM

## 2017-04-08 LAB — CBC
HCT: 37.3 % (ref 36.0–46.0)
Hemoglobin: 11.9 g/dL — ABNORMAL LOW (ref 12.0–15.0)
MCH: 27.5 pg (ref 26.0–34.0)
MCHC: 31.9 g/dL (ref 30.0–36.0)
MCV: 86.3 fL (ref 78.0–100.0)
Platelets: 233 10*3/uL (ref 150–400)
RBC: 4.32 MIL/uL (ref 3.87–5.11)
RDW: 14.3 % (ref 11.5–15.5)
WBC: 8.4 10*3/uL (ref 4.0–10.5)

## 2017-04-08 LAB — GLUCOSE, CAPILLARY
Glucose-Capillary: 285 mg/dL — ABNORMAL HIGH (ref 65–99)
Glucose-Capillary: 204 mg/dL — ABNORMAL HIGH (ref 65–99)
Glucose-Capillary: 324 mg/dL — ABNORMAL HIGH (ref 65–99)
Glucose-Capillary: 210 mg/dL — ABNORMAL HIGH (ref 65–99)

## 2017-04-08 LAB — LIPID PANEL
Cholesterol: 205 mg/dL — ABNORMAL HIGH (ref 0–200)
HDL: 26 mg/dL — ABNORMAL LOW (ref >40)
LDL Cholesterol: 101 mg/dL — ABNORMAL HIGH (ref 0–99)
Total CHOL/HDL Ratio: 7.9 RATIO
Triglycerides: 389 mg/dL — ABNORMAL HIGH (ref <150)
VLDL: 78 mg/dL — ABNORMAL HIGH (ref 0–40)

## 2017-04-08 LAB — HEMOGLOBIN A1C
Hgb A1c MFr Bld: 9.8 % — ABNORMAL HIGH (ref 4.8–5.6)
Mean Plasma Glucose: 234.56 mg/dL

## 2017-04-08 LAB — BASIC METABOLIC PANEL
Anion gap: 15 (ref 5–15)
BUN: 25 mg/dL — ABNORMAL HIGH (ref 6–20)
CO2: 22 mmol/L (ref 22–32)
Calcium: 8.7 mg/dL — ABNORMAL LOW (ref 8.9–10.3)
Chloride: 101 mmol/L (ref 101–111)
Creatinine, Ser: 2.63 mg/dL — ABNORMAL HIGH (ref 0.44–1.00)
GFR calc Af Amer: 23 mL/min — ABNORMAL LOW (ref >60)
GFR calc non Af Amer: 20 mL/min — ABNORMAL LOW (ref >60)
Glucose, Bld: 295 mg/dL — ABNORMAL HIGH (ref 65–99)
Potassium: 3.8 mmol/L (ref 3.5–5.1)
Sodium: 138 mmol/L (ref 135–145)

## 2017-04-08 LAB — HIV ANTIBODY (ROUTINE TESTING W REFLEX): HIV Screen 4th Generation wRfx: NONREACTIVE

## 2017-04-08 LAB — ECHOCARDIOGRAM COMPLETE
Height: 70 in
Weight: 3520 oz

## 2017-04-08 MED ORDER — ASPIRIN EC 81 MG PO TBEC
81.0000 mg | DELAYED_RELEASE_TABLET | Freq: Every day | ORAL | Status: DC
  Administered 2017-04-08 – 2017-04-10 (×3): 81 mg via ORAL
  Filled 2017-04-08 (×3): qty 1

## 2017-04-08 MED ORDER — PANTOPRAZOLE SODIUM 40 MG PO TBEC
40.0000 mg | DELAYED_RELEASE_TABLET | Freq: Every day | ORAL | Status: DC
  Administered 2017-04-08 – 2017-04-09 (×2): 40 mg via ORAL
  Filled 2017-04-08 (×2): qty 1

## 2017-04-08 MED ORDER — ONDANSETRON HCL 4 MG/2ML IJ SOLN
4.0000 mg | Freq: Four times a day (QID) | INTRAMUSCULAR | Status: DC | PRN
  Administered 2017-04-08 – 2017-04-10 (×2): 4 mg via INTRAVENOUS
  Filled 2017-04-08 (×2): qty 2

## 2017-04-08 MED ORDER — CLOPIDOGREL BISULFATE 75 MG PO TABS
75.0000 mg | ORAL_TABLET | Freq: Every day | ORAL | Status: DC
  Administered 2017-04-08 – 2017-04-10 (×3): 75 mg via ORAL
  Filled 2017-04-08 (×3): qty 1

## 2017-04-08 MED ORDER — DIPHENOXYLATE-ATROPINE 2.5-0.025 MG/5ML PO LIQD
5.0000 mL | Freq: Two times a day (BID) | ORAL | Status: DC | PRN
  Administered 2017-04-08: 5 mL via ORAL
  Filled 2017-04-08 (×2): qty 5

## 2017-04-08 MED ORDER — DIPHENOXYLATE-ATROPINE 2.5-0.025 MG PO TABS
2.0000 | ORAL_TABLET | Freq: Three times a day (TID) | ORAL | Status: DC
  Administered 2017-04-09 – 2017-04-10 (×4): 2 via ORAL
  Filled 2017-04-08 (×5): qty 2

## 2017-04-08 MED ORDER — PROMETHAZINE HCL 25 MG/ML IJ SOLN
12.5000 mg | Freq: Once | INTRAMUSCULAR | Status: AC
  Administered 2017-04-08: 12.5 mg via INTRAVENOUS
  Filled 2017-04-08: qty 1

## 2017-04-08 NOTE — Progress Notes (Signed)
*  PRELIMINARY RESULTS* Vascular Ultrasound Carotid Duplex (Doppler) has been completed. Findings suggest 1-39% internal carotid artery stenosis bilaterally. Vertebral arteries are patent with antegrade flow.  04/08/2017 1:13 PM Maudry Mayhew, BS, RVT, RDCS, RDMS

## 2017-04-08 NOTE — Progress Notes (Signed)
Modified Barium Swallow Progress Note  Patient Details  Name: [name] MRN: [id number] Date of Birth: Aug 06, 1965  Today's Date: 04/08/2017  Modified Barium Swallow completed.  Full report located under Chart Review in the Imaging Section.  Brief recommendations include the following:  Clinical Impression  Pt demonstrates slightly delayed/partially incomplete airway closure before/during the swallow leading to trace flash penetration and one instance of sensed aspiration. A chin tuck provided slightly extra time for complete airway closure, even with consecutive straw sips. Pt was able to return demonstrate. Otherwise swallow function WNL. Recommend pt consume a regular diet with thin liquids with a chin tuck, pills whole in puree.    Swallow Evaluation Recommendations       SLP Diet Recommendations: Regular solids;Thin liquid   Liquid Administration via: Straw   Medication Administration: Whole meds with puree       Compensations: Slow rate;Small sips/bites;Chin tuck   Postural Changes: Remain semi-upright after after feeds/meals (Comment);Seated upright at 90 degrees   Oral Care Recommendations: Patient independent with oral care       [facility], MA CCC-SLP [phone]  [name], [name] 04/08/2017,2:37 PM

## 2017-04-08 NOTE — Evaluation (Signed)
Speech Language Pathology Evaluation Patient Details Name: [name] MRN: [id number] DOB: 10-13-1965 Today's Date: 04/08/2017 Time: 0240-9735 SLP Time Calculation (min) (ACUTE ONLY): 35 min  Problem List:  Patient Active Problem List   Diagnosis Date Noted  . Acute right-sided weakness 04/07/2017  . Slurred speech 04/07/2017  . Right sided weakness 04/07/2017  . Hyperhidrosis 09/01/2016  . Migraine with aura and without status migrainosus, not intractable 07/28/2016  . 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 CKD stage 3 due to type 2 diabetes mellitus ([place]) 06/27/2014  . Vitamin D insufficiency 05/08/2014  . Obesity (BMI 30.0-34.9) 05/08/2014  . 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 Bursitis of left shoulder 02/14/2014  . Neck pain 01/03/2014  . Diabetes mellitus type 2, uncontrolled, with complications ([place]) 32/99/2426  . 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.23) 01/14/2013  . Insomnia 08/04/2012  . Diabetic neuropathy, painful ([place]) 08/04/2011  . Anxiety and depression 05/16/2010  . Essential hypertension, benign 11/01/2007  . Female stress incontinence 11/01/2007  . TOBACCO DEPENDENCE 04/30/2006  . GASTROESOPHAGEAL REFLUX, NO ESOPHAGITIS 04/30/2006  . Irritable bowel syndrome 04/30/2006   Past Medical History:  Past Medical History:  Diagnosis Date  . Anxiety 2002  . Chest tightness   . Depression 2001  . Diabetes type 1, uncontrolled ([place])    at age 49  . Diabetic neuropathy ([place])   . Essential hypertension 2015  . GERD (gastroesophageal reflux disease)    about age of 18  . Nausea and vomiting in adult   . Urinary frequency   . Yeast vaginitis    Past Surgical History:  Past Surgical History:  Procedure Laterality Date  . ACHILLES TENDON SURGERY Left 03/10/2013   Procedure: LEFT CHOPART AMPUTATION/ LEFT TENDON ACHILLES [place];  Surgeon: Wylene Simmer, MD;  Location: [facility];   Service: Orthopedics;  Laterality: Left;  . AMPUTATION Left 06/15/2012   Procedure: AMPUTATION DIGIT;  Surgeon: Meredith Pel, MD;  Location: [place];  Service: Orthopedics;  Laterality: Left;  Left great toe revision amputation  . ENDOMETRIAL ABLATION    . LAPAROSCOPIC CHOLECYSTECTOMY  01/30/2015   [facility] day surgery    HPI:  pt is a 52 yo female adm to [facility] with right sided weakness and speech deficits.  MRI 04/07/17 Small acute/early subacute infarctions within the left posterior right limb of internal capsule and right splenium of corpus callosum.Marland Kitchen  Pt passed RNSSS but was coughing with intake of Sprite.  Pt report h/o sensing throat tightening when taking a medication for neuropathy - has since stopped this medication with resolution of symptoms.     Assessment / Plan / Recommendation Clinical Impression  Patient presents with moderate mixed dysarthria resulting in imprecise articulation, decreased phonatory strength resulting in decreased intelligibility.  Pt responds well to verbal cues to slow rate and overarticulate to improve intelligibilty.  Also noted snoring respirations - pt denies having h/o OSA nor undergoing testing.  She was oriented x4, demonstrated good recall of copious information provided without cues.  Pt states daughter reported she could do online classes to allow her to take care of the patient - demonstrating good awareness of needing extra help.  Did not test writing as pt is right handed and has clear right sided weakness.  Pt will benefit from follow up SLP for dysphagia/dysarthria management.  Pt agreeable to plan using teach back.        SLP Assessment  SLP Recommendation/Assessment: Patient needs continued  Speech Lanaguage Pathology Services SLP Visit Diagnosis: Dysarthria and anarthria (R47.1)    Follow Up Recommendations  Inpatient Rehab    Frequency and Duration min 1 x/week  1 week      SLP Evaluation Cognition  Overall Cognitive Status: Within Functional  Limits for tasks assessed Arousal/Alertness: Awake/alert Orientation Level: Oriented X4 Attention: Sustained Sustained Attention: Appears intact Memory: Appears intact Awareness: Appears intact Problem Solving: Appears intact Safety/Judgment: Appears intact       Comprehension  Auditory Comprehension Overall Auditory Comprehension: Appears within functional limits for tasks assessed Yes/No Questions: Within Functional Limits Commands: Within Functional Limits(multi-process) Conversation: Diplomatic Services operational officer Discrimination: Not tested Reading Comprehension Reading Status: Not tested    Expression Expression Primary Mode of Expression: Verbal Verbal Expression Overall Verbal Expression: Appears within functional limits for tasks assessed Initiation: No impairment Repetition: No impairment Naming: No impairment Pragmatics: No impairment Non-Verbal Means of Communication: Not applicable Written Expression Dominant Hand: Right Written Expression: (dnt due to right hand weakness)   Oral / Motor  Oral Motor/Sensory Function Overall Oral Motor/Sensory Function: Moderate impairment Facial ROM: Reduced right Facial Symmetry: Abnormal symmetry right Facial Strength: Reduced right Velum: Within Functional Limits Motor Speech Overall Motor Speech: Impaired Respiration: Impaired Phonation: Low vocal intensity Resonance: Hyponasality Articulation: Impaired Level of Impairment: Phrase Intelligibility: Intelligibility reduced Word: 75-100% accurate Phrase: 50-74% accurate Sentence: 50-74% accurate Conversation: 25-49% accurate Effective Techniques: Slow rate;Over-articulate   GO                    [name] 04/08/2017, 9:48 AM  [name], [place] [facility] SLP [phone]

## 2017-04-08 NOTE — Evaluation (Signed)
Occupational Therapy Evaluation Patient Details Name: [name] MRN: [id number] DOB: [birth year] Today's Date: 04/08/2017    History of Present Illness This 52 y.o. female admitted with slurred speech, Rt facial droop, Rt sided weakness.  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 She demonstrates the below listed deficits and will benefit from continued OT to maximize safety and independence with BADLs.  Pt presents to OT with Rt hemiparesis, impaired balance, impaired ROM and coordination Rt UE.  She requires min a for ADLs and functional transfers.  She lives with daughter, who can provide 24 hour supervision/assist at discharge.  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  Will follow.       Follow Up Recommendations  Outpatient OT;Supervision/Assistance - 24 hour    Equipment Recommendations  None recommended by OT    Recommendations for Other Services       Precautions / Restrictions Precautions Precautions: Fall Required Braces or Orthoses: Other Brace/Splint(has walking boot for Lt LE ) Other Brace/Splint: has walking boot for Lt LE       Mobility Bed Mobility Overal bed mobility: Needs Assistance Bed Mobility: Supine to Sit;Sit to Supine     Supine to sit: Supervision Sit to supine: Supervision   General bed mobility comments: with use of bedrails   Transfers Overall transfer level: Needs assistance   Transfers: Sit to/from Stand Sit to Stand: Min assist         General transfer comment: assist to steady     Balance                                           ADL either performed or assessed with clinical judgement   ADL Overall ADL's : Needs  assistance/impaired Eating/Feeding: NPO   Grooming: Wash/dry hands;Wash/dry face;Oral care;Brushing hair;Set up;Sitting   Upper Body Bathing: Minimal assistance;Sitting   Lower Body Bathing: Minimal assistance;Sit to/from stand   Upper Body Dressing : Minimal assistance;Sitting   Lower Body Dressing: Minimal assistance;Sit to/from stand Lower Body Dressing Details (indicate cue type and reason): assist for balance and to pull pants over hips  Toilet Transfer: Minimal assistance;Stand-pivot;BSC   Toileting- Clothing Manipulation and Hygiene: Minimal assistance;Sit to/from stand       Functional mobility during ADLs: Minimal assistance       Vision Baseline Vision/History: Wears glasses Wears Glasses: Reading only Patient Visual Report: No change from baseline Vision Assessment?: No apparent visual deficits Additional Comments: full EOMs.  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  She is able to opose digit 4 with effort.  Denies numbness/tingling.   uses Rt UE as an active assist   RUE Coordination: decreased fine motor;decreased gross motor   Lower Extremity Assessment Lower Extremity Assessment: Defer to PT evaluation  Cervical / Trunk Assessment Cervical / Trunk Assessment: Normal   Communication Communication Communication: Expressive difficulties   Cognition Arousal/Alertness: Awake/alert Behavior During Therapy: WFL for tasks assessed/performed Overall Cognitive Status: Within Functional Limits for tasks assessed                                      General Comments  eval limited due to transport arrived to take pt for procedure     Exercises     Shoulder Instructions      Home Living Family/patient expects to be discharged to:: Private residence Living Arrangements: Children Available Help at Discharge: Family;Available 24 hours/day Type of Home: House Home Access: Stairs to enter [employer] of Steps: 2-3 Entrance Stairs-Rails: Left Home Layout: One level     Bathroom Shower/Tub: Walk-in shower;Tub/shower unit   Bathroom Toilet: Standard     Home Equipment: Environmental consultant - 2 wheels;Cane - single point;Toilet riser;Shower seat;Bedside commode   Additional Comments: daughter is in school, but will convert to online classes in order to provide necessary level of care at discharge   Lives With: Daughter    Prior Functioning/Environment Level of Independence: Independent        Comments: Pt reports she ambulated without AD, denies falls with exception of the fall she experienced just PTA.  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 Problem List: Decreased strength;Decreased range of motion;Decreased activity tolerance;Impaired balance (sitting and/or standing);Decreased coordination;Decreased safety awareness;Decreased knowledge of use of DME or AE;Impaired UE functional use      OT Treatment/Interventions: Self-care/ADL training;Neuromuscular education;DME and/or AE instruction;Therapeutic activities;Patient/family education;Balance training    OT Goals(Current goals can be found in the care plan section) Acute Rehab OT Goals Patient Stated Goal: to go home  OT Goal Formulation: With patient Time For Goal Achievement: 04/22/17 Potential to Achieve Goals: Good ADL Goals Pt Will Perform Grooming: with supervision;standing Pt Will Perform Upper Body Bathing: with set-up;sitting Pt Will Perform Lower Body Bathing: with supervision Pt Will Perform Upper Body Dressing: with  supervision;sitting Pt Will Perform Lower Body Dressing: with supervision;sit to/from stand Pt Will Transfer to Toilet: ambulating;with min guard assist Pt Will Perform Toileting - Clothing Manipulation and hygiene: with supervision;sit to/from stand Pt Will Perform Tub/Shower Transfer: Tub transfer;with min assist;ambulating;shower seat;rolling walker Pt/caregiver will Perform Home Exercise Program: Increased ROM;Right Upper extremity;With written HEP provided;Independently  OT Frequency: Min 3X/week   Barriers to D/C:            Co-evaluation              AM-PAC PT "6 Clicks" Daily Activity     Outcome Measure Help from another person eating meals?: Total Help from another person taking care of personal grooming?: A Little Help from another person toileting, which includes using toliet, bedpan, or urinal?: A Little Help from another person bathing (including washing, rinsing, drying)?: A Little Help from another person to put on and taking off regular upper body clothing?: A Little Help from another person to put on and taking off regular lower body clothing?: A Little 6 Click Score: 16   End of Session Nurse Communication: Mobility status  Activity Tolerance: Patient tolerated treatment well Patient left: in bed;with call bell/phone within reach  OT Visit Diagnosis: Unsteadiness on feet (R26.81);Hemiplegia and hemiparesis Hemiplegia - Right/Left: Right Hemiplegia - dominant/non-dominant: Dominant Hemiplegia - caused by:  Cerebral infarction                Time: 6429-0379 OT Time Calculation (min): 11 min Charges:  OT General Charges $OT Visit: 1 Visit OT Evaluation $OT Eval Moderate Complexity: 1 Mod G-Codes:     [facility], OTR/L [phone]   Lucille Passy M 04/08/2017, 11:06 AM

## 2017-04-08 NOTE — Progress Notes (Addendum)
Inpatient Diabetes Program Recommendations  AACE/ADA: New Consensus Statement on Inpatient Glycemic Control (2015)  Target Ranges:  Prepandial:   less than 140 mg/dL      Peak postprandial:   less than 180 mg/dL (1-2 hours)      Critically ill patients:  140 - 180 mg/dL   Results for [name], [name] (MRN [id number]) as of 04/08/2017 12:33  Ref. Range 04/07/2017 22:05 04/08/2017 07:25 04/08/2017 12:29  Glucose-Capillary Latest Ref Range: 65 - 99 mg/dL 293 (H) 285 (H) 204 (H)   Review of Glycemic Control  Outpatient Diabetes medications: Lantus 50-60 units QHS, Novolog 13 units TID with meals, Januvia 50 mg daily Current orders for Inpatient glycemic control: Lantus 55 units QHS, Novolog 0-15 units TID with meals, Novolog 0-5 units QHS  Inpatient Diabetes Program Recommendations: Insulin - Basal: Please consider increasing Lantus to 60 units QHS. Insulin - Meal Coverage: Please consider ordering Novolog 4 units TID with meals for meal coverage if patient eats at least 50% of meals (in addition to Novolog correction). HgbA1C: A1C 9.8% on 04/08/17 which is improved from 13.3% on 02/16/17. However, patient needs to follow up with PCP to continue to improve DM control.  Addendum 04/08/17@15 :38-Spoke with patient regarding DM control and A1C. Patient states that she has been several changes over the past few months and she has been taking medications as prescribed. Informed patient of A1C 9.3% on 04/08/17 which is improved from 13.3% on 02/16/17. Patient is hopeful to continue to get her A1C down to goal of 7% or less and plans to continue to work with her PCP to improve DM control. Commended patient for the improved A1C and encouraged her to continue working toward goal A1C and glucose targets.  Patient verbalized understanding of information discussed and reports that she has no further questions at this time.  Thanks, Barnie Alderman, RN, MSN, CDE Diabetes Coordinator Inpatient Diabetes Program [phone]  (Team Pager from 8am to 5pm)

## 2017-04-08 NOTE — Progress Notes (Signed)
Patient returned from [facility] with instruction for diet at this time. Diet ordered.     Ave Filter, RN

## 2017-04-08 NOTE — Progress Notes (Signed)
PROGRESS NOTE    [name]  XBJ:[id number] DOB: 1965/10/20 DOA: 04/07/2017 PCP: Ladell Pier, MD  Brief Narrative: [name] is a 52 y.o. female with a Past Medical History of HTN, DM, and anxiety/depression who presents with neurologic symptoms concerning for CVA.  Patient started this AM with symptoms; on exam she continues to demonstrate slurred speech, R facial droop, and R sided weakness.  +LLL rhonchi on exam.  CT negative but MRI confirms small acute/subacute infarctions in the L posterior limb of the internal capsule and R splenium of corpus callosum   Assessment & Plan:  Acute CVA  -small acute/subacute infarctions in the L posterior limb of the internal capsule and R splenium of corpus callosum noted on MRI -With resultant dysarthria and right-sided weakness -Appreciate neurology input suspected to be secondary to small vessel disease -Neurology consult appreciated, recommended to continue aspirin and Plavix combination for 3 weeks followed by aspirin alone -LDL is 101, start statin -FU 2-D echocardiogram and carotid duplex -PT OT and SLP evaluations ongoing -May likely need rehabilitation  HTN -SBP in the 160-200 range, continue to hold antihypertensives at this time to allow permissive hypertension in the setting of acute CVA  -Holding ACE/CCB and HCTZ at this time  HLD LDL 101, started statin  DM -Hemoglobin A1 C is 9.8, hold Januvia, continue Lantus and sliding-scale insulin  Cough/URI/COPD -Started with URI,  5-6 days ago, has mild intermittent cough now, well before the onset of strokelike symptoms -No wheezing, chest x-ray unremarkable -DC antibiotics and monitor  Chronic kidney disease stage 3-4 -Stable at baseline, avoid nephrotoxins, monitor  Depression/anxiety -Continue Cymbalta and nortriptyline  DVT prophylaxis: Lovenox Code Status: Full code Family Communication: No family at bedside Disposition Plan: To be determined pending  PT eval and completion of stroke workup  Consultants:   Neurology   Procedures:   Antimicrobials:    Subjective: -Continues to have right-sided weakness, slurring of speech seems to be better  Objective: Vitals:   04/08/17 0440 04/08/17 0523 04/08/17 1014 04/08/17 1355  BP: (!) 192/75 (!) 210/71 (!) 164/70 (!) 164/70  Pulse: 92 87 80 87  Resp:  18 18 15   Temp:  98.7 F (37.1 C) 98.6 F (37 C) 98.1 F (36.7 C)  TempSrc:  Oral Axillary Axillary  SpO2: 94% 94% 94% 90%  Weight:      Height:        Intake/Output Summary (Last 24 hours) at 04/08/2017 1457 Last data filed at 04/08/2017 1237 Gross per 24 hour  Intake 568.33 ml  Output -  Net 568.33 ml   Filed Weights   04/07/17 1123  Weight: 99.8 kg (220 lb)    Examination:  General exam: Appears calm and comfortable, no distress Respiratory system: Clear to auscultation. Respiratory effort normal. Cardiovascular system: S1 & S2 heard, RRR. No JVD, murmurs, rubs, gallops Gastrointestinal system: Abdomen is nondistended, soft and nontender.Normal bowel sounds heard. Central nervous system: Alert and oriented, right upper extremity is 4 out of 5 with mild pronator drift, Right lower extremity is 2-3 out of 5, diminished light touch in right leg, DTR 2+, Extremities: Symmetric 5 x 5 power, prior transmetatarsal amputation noted Skin: No rashes, lesions or ulcers Psychiatry: Judgement and insight appear normal. Mood & affect appropriate.     Data Reviewed:   CBC: Recent Labs  Lab 04/07/17 1148 04/07/17 1329 04/08/17 0154  WBC 9.0  --  8.4  NEUTROABS 6.6  --   --   HGB  12.7 15.3* 11.9*  HCT 39.6 45.0 37.3  MCV 86.3  --  86.3  PLT 239  --  297   Basic Metabolic Panel: Recent Labs  Lab 04/07/17 1148 04/07/17 1329 04/08/17 0154  NA 134* 138 138  K 4.1 4.2 3.8  CL 101 102 101  CO2 20*  --  22  GLUCOSE 364* 324* 295*  BUN 24* 26* 25*  CREATININE 2.55* 2.50* 2.63*  CALCIUM 8.6*  --  8.7*   GFR: Estimated  Creatinine Clearance: 33.1 mL/min (A) (by C-G formula based on SCr of 2.63 mg/dL (H)). Liver Function Tests: Recent Labs  Lab 04/07/17 1148  AST 45*  ALT 45  ALKPHOS 129*  BILITOT 0.5  PROT 6.8  ALBUMIN 3.2*   No results for input(s): LIPASE, AMYLASE in the last 168 hours. No results for input(s): AMMONIA in the last 168 hours. Coagulation Profile: Recent Labs  Lab 04/07/17 1148  INR 0.99   Cardiac Enzymes: No results for input(s): CKTOTAL, CKMB, CKMBINDEX, TROPONINI in the last 168 hours. BNP (last 3 results) No results for input(s): PROBNP in the last 8760 hours. HbA1C: Recent Labs    04/08/17 0154  HGBA1C 9.8*   CBG: Recent Labs  Lab 04/07/17 1131 04/07/17 2205 04/08/17 0725 04/08/17 1229  GLUCAP 319* 293* 285* 204*   Lipid Profile: Recent Labs    04/08/17 0154  CHOL 205*  HDL 26*  LDLCALC 101*  TRIG 389*  CHOLHDL 7.9   Thyroid Function Tests: Recent Labs    04/07/17 1936  TSH 2.862   Anemia Panel: No results for input(s): VITAMINB12, FOLATE, FERRITIN, TIBC, IRON, RETICCTPCT in the last 72 hours. Urine analysis:    Component Value Date/Time   COLORURINE YELLOW 03/29/2014 [place] 03/29/2014 1259   LABSPEC 1.020 03/29/2014 1259   PHURINE 7.5 03/29/2014 1259   GLUCOSEU NEGATIVE 03/29/2014 1259   HGBUR TRACE-INTACT (A) 03/29/2014 1259   BILIRUBINUR neg 02/16/2017 1501   KETONESUR NEGATIVE 03/29/2014 1259   PROTEINUR >=30 02/16/2017 1501   UROBILINOGEN 0.2 02/16/2017 1501   UROBILINOGEN 0.2 03/29/2014 1259   NITRITE neg 02/16/2017 1501   NITRITE NEGATIVE 03/29/2014 1259   LEUKOCYTESUR Negative 02/16/2017 1501   Sepsis Labs: @LABRCNTIP (procalcitonin:4,lacticidven:4)  )No results found for this or any previous visit (from the past 240 hour(s)).       Radiology Studies: Dg Chest 2 View  Result Date: 04/07/2017 CLINICAL DATA:  Acute facial droop, slurred speech and right-sided weakness for 1 day. Cough, shortness of  breath and fever for 3 days. EXAM: CHEST  2 VIEW COMPARISON:  06/14/2012 radiographs FINDINGS: The cardiomediastinal silhouette is unremarkable. Peribronchial thickening has increased. There is no evidence of focal airspace disease, pulmonary edema, suspicious pulmonary nodule/mass, pleural effusion, or pneumothorax. No acute bony abnormalities are identified. IMPRESSION: 1. Slightly increased peribronchial thickening from 2014 without other significant abnormality. Electronically Signed   By: Margarette Canada M.D.   On: 04/07/2017 13:01   Ct Head Wo Contrast  Result Date: 04/07/2017 CLINICAL DATA:  Right-sided weakness with facial droop and slurred speech. EXAM: CT HEAD WITHOUT CONTRAST TECHNIQUE: Contiguous axial images were obtained from the base of the skull through the vertex without intravenous contrast. COMPARISON:  None. FINDINGS: Brain: No evidence of acute infarction, hemorrhage, hydrocephalus, extra-axial collection or mass lesion/mass effect. 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 Mild presumed chronic small vessel ischemic type change around the lateral ventricles. Vascular: No hyperdense  vessel or unexpected calcification. Skull: Normal. Negative for fracture or focal lesion. Sinuses/Orbits: No acute finding. Other: Call has been placed to Dr. Leonel Ramsay. IMPRESSION: 1. No acute finding. 2. Mild chronic small vessel ischemic change. Electronically Signed   By: Monte Fantasia M.D.   On: 04/07/2017 12:16   Mr Jodene Nam Head Wo Contrast  Result Date: 04/07/2017 CLINICAL DATA:  52 y/o F; slurred speech and right-sided body weakness. EXAM: MRI HEAD WITHOUT CONTRAST MRA HEAD WITHOUT CONTRAST TECHNIQUE: Sagittal T1 FLAIR, axial DWI, coronal DWI, axial T2 sequences MRI head were acquired. Angiographic images of the head were obtained using MRA technique without contrast. COMPARISON:  04/07/2017 CT head FINDINGS: MRI HEAD FINDINGS Brain:  Small foci of reduced diffusion are present within the left posterior limb of internal capsule and right splenium of corpus callosum. No focal mass effect. No hydrocephalus, extra-axial collection, or effacement of basilar cisterns identified. Motion degraded axial T2 and sagittal T1 sequences. Vascular: As below. Skull and upper cervical spine: Normal marrow signal. Sinuses/Orbits: Negative. Other: None. MRA HEAD FINDINGS Internal carotid arteries: Patent. Mild irregularity of carotid siphons compatible with atherosclerosis with mild left cavernous and right paraclinoid stenosis. 2 mm inferiorly directed outpouching of right cavernous ICA, likely prominent infundibulum of diminutive vessel. Anterior cerebral arteries:  Patent. Middle cerebral arteries: Patent. Anterior communicating artery: Patent. Posterior communicating arteries: Patent right. No left identified, likely hypoplastic or absent. Posterior cerebral arteries: Patent. Left P2 short segment of mild stenosis. Basilar artery:  Patent.  Lower basilar fenestration. Vertebral arteries:  Patent. No evidence of high-grade stenosis, large vessel occlusion, or aneurysm unless noted above. IMPRESSION: 1. Small acute/early subacute infarctions within the left posterior limb of internal capsule and right splenium of corpus callosum. 2. Patent anterior and posterior intracranial circulation. No large vessel occlusion, high-grade stenosis, or aneurysm. 3. Intracranial atherosclerosis with multiple segments of mild stenosis. These results were called by telephone at the time of interpretation on 04/07/2017 at 3:58 pm to PA Law , who verbally acknowledged these results. Electronically Signed   By: Kristine Garbe M.D.   On: 04/07/2017 16:03   Mr Brain Wo Contrast  Result Date: 04/07/2017 CLINICAL DATA:  52 y/o F; slurred speech and right-sided body weakness. EXAM: MRI HEAD WITHOUT CONTRAST MRA HEAD WITHOUT CONTRAST TECHNIQUE: Sagittal T1 FLAIR, axial DWI,  coronal DWI, axial T2 sequences MRI head were acquired. Angiographic images of the head were obtained using MRA technique without contrast. COMPARISON:  04/07/2017 CT head FINDINGS: MRI HEAD FINDINGS Brain: Small foci of reduced diffusion are present within the left posterior limb of internal capsule and right splenium of corpus callosum. No focal mass effect. No hydrocephalus, extra-axial collection, or effacement of basilar cisterns identified. Motion degraded axial T2 and sagittal T1 sequences. Vascular: As below. Skull and upper cervical spine: Normal marrow signal. Sinuses/Orbits: Negative. Other: None. MRA HEAD FINDINGS Internal carotid arteries: Patent. Mild irregularity of carotid siphons compatible with atherosclerosis with mild left cavernous and right paraclinoid stenosis. 2 mm inferiorly directed outpouching of right cavernous ICA, likely prominent infundibulum of diminutive vessel. Anterior cerebral arteries:  Patent. Middle cerebral arteries: Patent. Anterior communicating artery: Patent. Posterior communicating arteries: Patent right. No left identified, likely hypoplastic or absent. Posterior cerebral arteries: Patent. Left P2 short segment of mild stenosis. Basilar artery:  Patent.  Lower basilar fenestration. Vertebral arteries:  Patent. No evidence of high-grade stenosis, large vessel occlusion, or aneurysm unless noted above. IMPRESSION: 1. Small acute/early subacute infarctions within the left posterior limb of internal capsule  and right splenium of corpus callosum. 2. Patent anterior and posterior intracranial circulation. No large vessel occlusion, high-grade stenosis, or aneurysm. 3. Intracranial atherosclerosis with multiple segments of mild stenosis. These results were called by telephone at the time of interpretation on 04/07/2017 at 3:58 pm to PA Law , who verbally acknowledged these results. Electronically Signed   By: Kristine Garbe M.D.   On: 04/07/2017 16:03   Dg Swallowing  Func-speech Pathology  Result Date: 04/08/2017 Objective Swallowing Evaluation: Type of Study: MBS-Modified Barium Swallow Study  Patient Details Name: [name] MRN: [id number] Date of Birth: September 01, 1965 Today's Date: 04/08/2017 Time: SLP Start Time (ACUTE ONLY): 1140 -SLP Stop Time (ACUTE ONLY): 1200 SLP Time Calculation (min) (ACUTE ONLY): 20 min Past Medical History: Past Medical History: Diagnosis Date . Anxiety 2002 . Chest tightness  . Depression 2001 . Diabetes type 1, uncontrolled ([place])   at age 89 . Diabetic neuropathy ([place])  . Essential hypertension 2015 . GERD (gastroesophageal reflux disease)   about age of 72 . Nausea and vomiting in adult  . Urinary frequency  . Yeast vaginitis  Past Surgical History: Past Surgical History: Procedure Laterality Date . ACHILLES TENDON SURGERY Left 03/10/2013  Procedure: LEFT CHOPART AMPUTATION/ LEFT TENDON ACHILLES [place];  Surgeon: Wylene Simmer, MD;  Location: [place];  Service: Orthopedics;  Laterality: Left; . AMPUTATION Left 06/15/2012  Procedure: AMPUTATION DIGIT;  Surgeon: Meredith Pel, MD;  Location: [place];  Service: Orthopedics;  Laterality: Left;  Left great toe revision amputation . ENDOMETRIAL ABLATION   . LAPAROSCOPIC CHOLECYSTECTOMY  01/30/2015  [facility] day surgery  HPI: pt is a 52 yo female adm to [facility] with right sided weakness and speech deficits.  MRI 04/07/17 Small acute/early subacute infarctions within the left posterior right limb of internal capsule and right splenium of corpus callosum.Marland Kitchen  Pt passed RNSSS but was coughing with intake of Sprite.  Pt report h/o sensing throat tightening when taking a medication for neuropathy - has since stopped this medication with resolution of symptoms.   Subjective: pt awake in bed, daughter present Assessment / Plan / Recommendation CHL IP CLINICAL IMPRESSIONS 04/08/2017 Clinical Impression Pt demonstrates slightly delayed/partially incomplete airway closure before/during the swallow leading to trace flash  penetration and one instance of sensed aspiration. A chin tuck provided slightly extra time for complete airway closure, even with consecutive straw sips. Pt was able to return demonstrate. Otherwise swallow function WNL. Recommend pt consume a regular diet with thin liquids with a chin tuck, pills whole in puree.  SLP Visit Diagnosis Dysphagia, oropharyngeal phase (R13.12) Attention and concentration deficit following -- Frontal lobe and executive function deficit following -- Impact on safety and function Mild aspiration risk   CHL IP TREATMENT RECOMMENDATION 04/08/2017 Treatment Recommendations Therapy as outlined in treatment plan below   Prognosis 04/08/2017 Prognosis for Safe Diet Advancement Good Barriers to Reach Goals -- Barriers/Prognosis Comment -- CHL IP DIET RECOMMENDATION 04/08/2017 SLP Diet Recommendations Regular solids;Thin liquid Liquid Administration via Straw Medication Administration Whole meds with puree Compensations Slow rate;Small sips/bites;Chin tuck Postural Changes Remain semi-upright after after feeds/meals (Comment);Seated upright at 90 degrees   CHL IP OTHER RECOMMENDATIONS 04/08/2017 Recommended Consults -- Oral Care Recommendations Patient independent with oral care Other Recommendations Order thickener from pharmacy   CHL IP FOLLOW UP RECOMMENDATIONS 04/08/2017 Follow up Recommendations Inpatient Rehab   CHL IP FREQUENCY AND DURATION 04/08/2017 Speech Therapy Frequency (ACUTE ONLY) min 2x/week Treatment Duration 2 weeks      CHL IP ORAL  PHASE 04/08/2017 Oral Phase WFL Oral - Pudding Teaspoon -- Oral - Pudding Cup -- Oral - Honey Teaspoon -- Oral - Honey Cup -- Oral - Nectar Teaspoon -- Oral - Nectar Cup -- Oral - Nectar Straw -- Oral - Thin Teaspoon -- Oral - Thin Cup -- Oral - Thin Straw -- Oral - Puree -- Oral - Mech Soft -- Oral - Regular -- Oral - Multi-Consistency -- Oral - Pill -- Oral Phase - Comment --  CHL IP PHARYNGEAL PHASE 04/08/2017 Pharyngeal Phase Impaired Pharyngeal- Pudding  Teaspoon -- Pharyngeal -- Pharyngeal- Pudding Cup -- Pharyngeal -- Pharyngeal- Honey Teaspoon -- Pharyngeal -- Pharyngeal- Honey Cup -- Pharyngeal -- Pharyngeal- Nectar Teaspoon -- Pharyngeal -- Pharyngeal- Nectar Cup -- Pharyngeal -- Pharyngeal- Nectar Straw -- Pharyngeal -- Pharyngeal- Thin Teaspoon -- Pharyngeal -- Pharyngeal- Thin Cup Penetration/Aspiration before swallow;Penetration/Aspiration during swallow;Reduced airway/laryngeal closure;Trace aspiration Pharyngeal Material enters airway, passes BELOW cords and not ejected out despite cough attempt by patient Pharyngeal- Thin Straw Penetration/Aspiration during swallow;Reduced airway/laryngeal closure;Compensatory strategies attempted (with notebox) Pharyngeal Material enters airway, remains ABOVE vocal cords then ejected out;Material does not enter airway Pharyngeal- Puree WFL Pharyngeal -- Pharyngeal- Mechanical Soft -- Pharyngeal -- Pharyngeal- Regular WFL Pharyngeal -- Pharyngeal- Multi-consistency -- Pharyngeal -- Pharyngeal- Pill WFL;Compensatory strategies attempted (with notebox) Pharyngeal -- Pharyngeal Comment --  No flowsheet data found. No flowsheet data found. Herbie [place], [place] CCC-SLP [phone] [place], [name] 04/08/2017, 2:38 PM                   Scheduled Meds: . aspirin EC  81 mg Oral Daily  . atorvastatin  40 mg Oral q1800  . clopidogrel  75 mg Oral Daily  . DULoxetine  60 mg Oral Daily  . insulin aspart  0-15 Units Subcutaneous TID WC  . insulin aspart  0-5 Units Subcutaneous QHS  . insulin glargine  55 Units Subcutaneous Q2200  . nortriptyline  75 mg Oral QHS  . pantoprazole  40 mg Oral Q1200  . zolpidem  5 mg Oral QHS   Continuous Infusions: . ampicillin-sulbactam (UNASYN) IV Stopped (04/08/17 1258)     LOS: 1 day    Time spent: 81min    Domenic Polite, MD [facility] Page via [email or web address], password [email or web address] After 7PM please contact night-coverage  04/08/2017, 2:57 PM

## 2017-04-08 NOTE — Progress Notes (Signed)
  Echocardiogram 2D Echocardiogram has been performed.  [name] [name] 04/08/2017, 11:28 AM

## 2017-04-08 NOTE — Evaluation (Addendum)
Clinical/Bedside Swallow Evaluation Patient Details  Name: [name] MRN: [id number] Date of Birth: 16-Sep-1965  Today's Date: 04/08/2017 Time: SLP Start Time (ACUTE ONLY): 0800 SLP Stop Time (ACUTE ONLY): 0830 SLP Time Calculation (min) (ACUTE ONLY): 30 min  Past Medical History:  Past Medical History:  Diagnosis Date  . Anxiety 2002  . Chest tightness   . Depression 2001  . Diabetes type 1, uncontrolled ([place])    at age 52  . Diabetic neuropathy ([place])   . Essential hypertension 2015  . GERD (gastroesophageal reflux disease)    about age of 58  . Nausea and vomiting in adult   . Urinary frequency   . Yeast vaginitis    Past Surgical History:  Past Surgical History:  Procedure Laterality Date  . ACHILLES TENDON SURGERY Left 03/10/2013   Procedure: LEFT CHOPART AMPUTATION/ LEFT TENDON ACHILLES [place];  Surgeon: Wylene Simmer, MD;  Location: [place];  Service: Orthopedics;  Laterality: Left;  . AMPUTATION Left 06/15/2012   Procedure: AMPUTATION DIGIT;  Surgeon: Meredith Pel, MD;  Location: [place];  Service: Orthopedics;  Laterality: Left;  Left great toe revision amputation  . ENDOMETRIAL ABLATION    . LAPAROSCOPIC CHOLECYSTECTOMY  01/30/2015   [facility] day surgery    HPI:  pt is a 52 yo female adm to [facility] with right sided weakness and speech deficits.  MRI 04/07/17 Small acute/early subacute infarctions within the left posterior right limb of internal capsule and right splenium of corpus callosum.Marland Kitchen  Pt passed RNSSS but was coughing with intake of Sprite.  Pt report h/o sensing throat tightening when taking a medication for neuropathy - has since stopped this medication with resolution of symptoms.     Assessment / Plan / Recommendation Clinical Impression  Pt presents with concerns for oral and possible pharyngeal dysphagia c/b coughing after liquids occuring with 10% of boluses - much more pronounced with thin liquids than nectar.     Pt with delayed cough after consuming  approximately 8 ounces of juice - but she reports this is due to hot flashes.  Suspect possible premature spillage of liquids into airway due to decreased oral coordination from CVA.   No indications of difficulty with applesauce, graham crackers nor oral pocketing.  Facial nerve deficit noted on impacting right.  Pt with snoring respirations noted which daughter and pt report have been present for several years.  Options are for MBS vs modified diet with precautions.  MD determined to proceed with MBS.  Of note, pt informed SLP of prior UGI study due to significant reflux symptoms and IBS.  She reports taking a PPI.   SLP Visit Diagnosis: Dysphagia, oropharyngeal phase (R13.12)    Aspiration Risk  Moderate aspiration risk    Diet Recommendation NPO x ice chips and medicine with puree - MBS today  Medication Administration: Whole meds with puree Supervision: Full supervision/cueing for compensatory strategies Compensations: Slow rate;Small sips/bites Postural Changes: Seated upright at 90 degrees;Remain upright for at least 30 minutes after po intake    Other  Recommendations Oral Care Recommendations: Oral care BID Other Recommendations: Order thickener from pharmacy   Follow up Recommendations Inpatient Rehab      Frequency and Duration min 2x/week  2 weeks       Prognosis Prognosis for Safe Diet Advancement: Good      Swallow Study   General Date of Onset: 04/08/17 HPI: pt is a 52 yo female adm to [facility] with right sided weakness and speech deficits.  MRI 04/07/17 Small acute/early subacute infarctions within the left posterior right limb of internal capsule and right splenium of corpus callosum.Marland Kitchen  Pt passed RNSSS but was coughing with intake of Sprite.  Pt report h/o sensing throat tightening when taking a medication for neuropathy - has since stopped this medication with resolution of symptoms.   Type of Study: Bedside Swallow Evaluation Diet Prior to this Study: NPO Temperature  Spikes Noted: Yes Respiratory Status: Room air History of Recent Intubation: No Behavior/Cognition: Alert;Cooperative;Pleasant mood Oral Cavity - Dentition: Adequate natural dentition Vision: Functional for self-feeding Self-Feeding Abilities: Able to feed self(using left hand) Patient Positioning: Upright in bed Baseline Vocal Quality: Normal Volitional Cough: Strong Volitional Swallow: Able to elicit    Oral/Motor/Sensory Function Overall Oral Motor/Sensory Function: Moderate impairment Facial ROM: Reduced right Facial Symmetry: Abnormal symmetry right Facial Strength: Reduced right Velum: Within Functional Limits   Ice Chips Ice chips: Within functional limits Presentation: Self Fed   Thin Liquid Thin Liquid: Impaired Presentation: Cup;Self Fed;Straw Pharyngeal  Phase Impairments: Cough - Immediate Other Comments: 3 ounce water test - pt with delayed cough and significant coughing with water x2/12 boluses    Nectar Thick Nectar Thick Liquid: Impaired Presentation: Cup;Self Fed;Straw Pharyngeal Phase Impairments: Cough - Delayed Other Comments: delayed cough after consumption of entire 8 ounces of juice   Honey Thick Honey Thick Liquid: Not tested   Puree Puree: Impaired Presentation: Self Fed;Spoon Oral Phase Functional Implications: Prolonged oral transit;Other (comment);Right anterior spillage Other Comments: pt cleared independently   Solid   GO   Solid: Within functional limits Presentation: Self Fredirick Lathe 04/08/2017,9:25 AM Luanna Salk, [place] [facility] SLP [phone]

## 2017-04-08 NOTE — Progress Notes (Signed)
NEUROHOSPITALISTS STROKE TEAM - DAILY PROGRESS NOTE   ADMISSION HISTORY: [name] is an 52 y.o. female with known past medical history of hypertension, diabetes who takes no antiplatelets and states that her blood pressure is usually in the 130s/90s.  Patient comes to the ED after waking up this morning and having a fall secondary to right leg weakness.  She stated that she noted that her right arm and right leg was weak and that her speech was off.  Upon entering the ED her blood pressure was 220/91, patient is dysarthric, and she shows mild weakness in the right arm and leg.  She has no sensory issues.  Currently her blood pressure still elevated at 222/88.  ED is administering. States that she was to pack a day smoker but has weaned herself down to 1 pack a day.  Date last known well: Date: 04/06/2017 Time last known well: Time: 21:00 tPA Given: No: Out of the window Modified Rankin: Rankin Score=0 NIHSS stroke scale 3  SUBJECTIVE (INTERVAL HISTORY) No family is at the bedside. Patient is found laying in bed in NAD. Overall she feels her condition is gradually improving. Voices no new complaints. No new events reported overnight.   OBJECTIVE Lab Results: CBC:  Recent Labs  Lab 04/07/17 1148 04/07/17 1329 04/08/17 0154  WBC 9.0  --  8.4  HGB 12.7 15.3* 11.9*  HCT 39.6 45.0 37.3  MCV 86.3  --  86.3  PLT 239  --  233   BMP: Recent Labs  Lab 04/07/17 1148 04/07/17 1329 04/08/17 0154  NA 134* 138 138  K 4.1 4.2 3.8  CL 101 102 101  CO2 20*  --  22  GLUCOSE 364* 324* 295*  BUN 24* 26* 25*  CREATININE 2.55* 2.50* 2.63*  CALCIUM 8.6*  --  8.7*   Liver Function Tests:  Recent Labs  Lab 04/07/17 1148  AST 45*  ALT 45  ALKPHOS 129*  BILITOT 0.5  PROT 6.8  ALBUMIN 3.2*   Thyroid Function Studies:  Recent Labs    04/07/17 1936  TSH 2.862   Coagulation Studies:  Recent Labs    04/07/17 1148  APTT 28    INR 0.99   PHYSICAL EXAM Temp:  [98.1 F (36.7 C)-100.7 F (38.2 C)] 98.6 F (37 C) (02/06 1014) Pulse Rate:  [72-92] 80 (02/06 1014) Resp:  [18-35] 18 (02/06 1014) BP: (164-215)/(69-93) 164/70 (02/06 1014) SpO2:  [93 %-97 %] 94 % (02/06 1014) General - Well nourished, well developed, in no apparent distress HEENT-  Normocephalic,  Cardiovascular - Regular rate and rhythm  Respiratory - Lungs clear bilaterally. No wheezing. Abdomen - soft and non-tender, BS normal Extremities- no edema or cyanosis. Left leg transmetatarsal amputation Neurological Examination Mental Status: Alert, oriented, thought content appropriate.  Speech significantly dysarthric without evidence of aphasia.  Able to follow 3 step commands without difficulty. Cranial Nerves: II: ; Visual fields grossly normal,  III,IV, VI: ptosis not present, extra-ocular motions intact bilaterally, pupils equal, round, reactive to light and accommodation V,VII: smile asymmetric on the right, facial light touch sensation normal bilaterally VIII: hearing normal bilaterally IX,X: uvula rises symmetrically XI: bilateral shoulder shrug XII: midline tongue extension Motor: Right : Upper extremity   4/5    Left:     Upper extremity   5/5  Lower extremity   2/5     Lower extremity   5/5  diminished fine finger movements on the right. Orbits left over right upper extremity.  Right grip weakness. Mild right upper and lower extremity drift Tone and bulk:normal tone throughout; no atrophy noted Sensory: Pinprick and light touch intact throughout, bilaterally Deep Tendon Reflexes: 2+ and symmetric throughout Plantars: Right: downgoing   Left: Amputation with Unna boot on Cerebellar: normal finger-to-nose,and normal heel-to-shin test Gait: Not tested  IMAGING: I have personally reviewed the radiological images below and agree with the radiology interpretations. Dg Chest 2 View  Result Date: 04/07/2017 CLINICAL DATA:  Acute facial  droop, slurred speech and right-sided weakness for 1 day. Cough, shortness of breath and fever for 3 days. EXAM: CHEST  2 VIEW COMPARISON:  06/14/2012 radiographs FINDINGS: The cardiomediastinal silhouette is unremarkable. Peribronchial thickening has increased. There is no evidence of focal airspace disease, pulmonary edema, suspicious pulmonary nodule/mass, pleural effusion, or pneumothorax. No acute bony abnormalities are identified. IMPRESSION: 1. Slightly increased peribronchial thickening from 2014 without other significant abnormality. Electronically Signed   By: Margarette Canada M.D.   On: 04/07/2017 13:01   Ct Head Wo Contrast  Result Date: 04/07/2017 CLINICAL DATA:  Right-sided weakness with facial droop and slurred speech. EXAM: CT HEAD WITHOUT CONTRAST TECHNIQUE: Contiguous axial images were obtained from the base of the skull through the vertex without intravenous contrast. COMPARISON:  None. FINDINGS: Brain: No evidence of acute infarction, hemorrhage, hydrocephalus, extra-axial collection or mass lesion/mass effect. 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 Mild presumed chronic small vessel ischemic type change around the lateral ventricles. Vascular: No hyperdense vessel or unexpected calcification. Skull: Normal. Negative for fracture or focal lesion. Sinuses/Orbits: No acute finding. Other: Call has been placed to Dr. Leonel Ramsay. IMPRESSION: 1. No acute finding. 2. Mild chronic small vessel ischemic change. Electronically Signed   By: Monte Fantasia M.D.   On: 04/07/2017 12:16   Mr Jodene Nam Head Wo Contrast  Result Date: 04/07/2017 CLINICAL DATA:  52 y/o F; slurred speech and right-sided body weakness. EXAM: MRI HEAD WITHOUT CONTRAST MRA HEAD WITHOUT CONTRAST TECHNIQUE: Sagittal T1 FLAIR, axial DWI, coronal DWI, axial T2 sequences MRI head were acquired. Angiographic images of the head were obtained using MRA technique  without contrast. COMPARISON:  04/07/2017 CT head FINDINGS: MRI HEAD FINDINGS Brain: Small foci of reduced diffusion are present within the left posterior limb of internal capsule and right splenium of corpus callosum. No focal mass effect. No hydrocephalus, extra-axial collection, or effacement of basilar cisterns identified. Motion degraded axial T2 and sagittal T1 sequences. Vascular: As below. Skull and upper cervical spine: Normal marrow signal. Sinuses/Orbits: Negative. Other: None. MRA HEAD FINDINGS Internal carotid arteries: Patent. Mild irregularity of carotid siphons compatible with atherosclerosis with mild left cavernous and right paraclinoid stenosis. 2 mm inferiorly directed outpouching of right cavernous ICA, likely prominent infundibulum of diminutive vessel. Anterior cerebral arteries:  Patent. Middle cerebral arteries: Patent. Anterior communicating artery: Patent. Posterior communicating arteries: Patent right. No left identified, likely hypoplastic or absent. Posterior cerebral arteries: Patent. Left P2 short segment of mild stenosis. Basilar artery:  Patent.  Lower basilar fenestration. Vertebral arteries:  Patent. No evidence of high-grade stenosis, large vessel occlusion, or aneurysm unless noted above. IMPRESSION: 1. Small acute/early subacute infarctions within the left posterior limb of internal capsule and right splenium of corpus callosum. 2. Patent anterior and posterior intracranial circulation. No large vessel occlusion, high-grade stenosis, or aneurysm. 3. Intracranial atherosclerosis with multiple segments of mild stenosis. These results were called by telephone at the time of interpretation on 04/07/2017 at 3:58 pm  to PA Law , who verbally acknowledged these results. Electronically Signed   By: Kristine Garbe M.D.   On: 04/07/2017 16:03   Mr Brain Wo Contrast  Result Date: 04/07/2017 CLINICAL DATA:  52 y/o F; slurred speech and right-sided body weakness. EXAM: MRI HEAD  WITHOUT CONTRAST MRA HEAD WITHOUT CONTRAST TECHNIQUE: Sagittal T1 FLAIR, axial DWI, coronal DWI, axial T2 sequences MRI head were acquired. Angiographic images of the head were obtained using MRA technique without contrast. COMPARISON:  04/07/2017 CT head FINDINGS: MRI HEAD FINDINGS Brain: Small foci of reduced diffusion are present within the left posterior limb of internal capsule and right splenium of corpus callosum. No focal mass effect. No hydrocephalus, extra-axial collection, or effacement of basilar cisterns identified. Motion degraded axial T2 and sagittal T1 sequences. Vascular: As below. Skull and upper cervical spine: Normal marrow signal. Sinuses/Orbits: Negative. Other: None. MRA HEAD FINDINGS Internal carotid arteries: Patent. Mild irregularity of carotid siphons compatible with atherosclerosis with mild left cavernous and right paraclinoid stenosis. 2 mm inferiorly directed outpouching of right cavernous ICA, likely prominent infundibulum of diminutive vessel. Anterior cerebral arteries:  Patent. Middle cerebral arteries: Patent. Anterior communicating artery: Patent. Posterior communicating arteries: Patent right. No left identified, likely hypoplastic or absent. Posterior cerebral arteries: Patent. Left P2 short segment of mild stenosis. Basilar artery:  Patent.  Lower basilar fenestration. Vertebral arteries:  Patent. No evidence of high-grade stenosis, large vessel occlusion, or aneurysm unless noted above. IMPRESSION: 1. Small acute/early subacute infarctions within the left posterior limb of internal capsule and right splenium of corpus callosum. 2. Patent anterior and posterior intracranial circulation. No large vessel occlusion, high-grade stenosis, or aneurysm. 3. Intracranial atherosclerosis with multiple segments of mild stenosis. These results were called by telephone at the time of interpretation on 04/07/2017 at 3:58 pm to PA Law , who verbally acknowledged these results. Electronically  Signed   By: Kristine Garbe M.D.   On: 04/07/2017 16:03   Echocardiogram:                                              PENDING B/L Carotid U/S:                                                PENDING     IMPRESSION: [name] is a 52 y.o. female with PMH of DM, HTN, HLD who presents with hypertensive urgency, dysarthria, right arm and leg weakness with negative CT scan for any acute bleed or infarct.  MRI reveals:  Small acute/early subacute infarctions within the left posterior limb of internal capsule and right splenium of corpus callosum  Suspected Etiology: small vessel disease Resultant Symptoms: dysarthria, right arm and leg weakness Stroke Risk Factors: diabetes mellitus, hyperlipidemia and hypertension mild obesity Other Stroke Risk Factors: Advanced age, Cigarette smoker, Obesity, Body mass index is 31.57 kg/m.   Outstanding Stroke Work-up Studies:     Echocardiogram:                                                    PENDING B/L Carotid  U/S:                                                      PENDING  04/08/2017 ASSESSMENT:   ABCD2 Score = 6 Neuro exam stable. Imaging/labs and POC reviewed with patient. Counseled to stop smoking/diet. Will need close follow up with PCP  PLAN  04/08/2017: Continue Aspirin/ Plavix/ Statin Frequent neuro checks Telemetry monitoring PT/OT/SLP Consult PM & Rehab Consult Case Management /MSW Ongoing aggressive stroke risk factor management Patient counseled to be compliant with her antithrombotic medications Patient counseled on Lifestyle modifications including, Diet, Exercise, and Stress [facility] in 6 weeks  HYPERTENSION: Unstable, elevated B/P noted overnight, Avoid Hypotension and Dehydration Permissive hypertension (OK if <220/120) for 24-48 hours post stroke and then gradually normalized within 5-7 days Labetalol PRN. Long term BP goal normotensive. May slowly restart home B/P  medications after 48 hours Home Meds: HCTZ, Norvasc  HYPERLIPIDEMIA:    Component Value Date/Time   CHOL 205 (H) 04/08/2017 0154   CHOL 275 (H) 02/16/2017 1608   TRIG 389 (H) 04/08/2017 0154   HDL 26 (L) 04/08/2017 0154   HDL 33 (L) 02/16/2017 1608   CHOLHDL 7.9 04/08/2017 0154   VLDL 78 (H) 04/08/2017 0154   LDLCALC 101 (H) 04/08/2017 0154   LDLCALC Comment 02/16/2017 1608  Home Meds:  NONE LDL  goal < 70 Started on Lipitor to 40 mg daily Continue statin at discharge  DIABETES: Lab Results  Component Value Date   HGBA1C 9.8 (H) 04/08/2017  HgbA1c goal < 7.0 Currently on: Novolog Continue CBG monitoring and SSI to maintain glucose 140-180 mg/dl DM education   TOBACCO ABUSE  Current smoker Smoking cessation counseling provided Nicotine patch provided  OBESITY Obesity, Body mass index is 31.57 kg/m. Greater than/equal to 30  Other Active Problems: Principal Problem:   Acute right-sided weakness Active Problems:   TOBACCO DEPENDENCE   Essential hypertension, benign   Diabetes mellitus type 2, uncontrolled, with complications (HCC)   CKD stage 3 due to type 2 diabetes mellitus ([place])   Slurred speech   Right sided weakness    Hospital day # 1 VTE prophylaxis: SCD's  Diet : Fall precautions Diet NPO time specified   FAMILY UPDATES: No family at bedside  TEAM UPDATES: Domenic Polite, MD    Prior Home Stroke Medications:  No antithrombotic  Discharge Stroke Meds:  Please discharge patient on aspirin 81 mg daily, clopidogrel 75 mg daily and for 3 weeks, then ASA 325 mg daily alone   Disposition: 06-Home-Health Care Svc Therapy Recs:               PENDING Follow Up:  Follow-up Information    Garvin Fila, MD. Schedule an appointment as soon as possible for a visit in 6 week(s).   Specialties:  Neurology, Radiology Contact information: [address] [place] [address] [phone]          Ladell Pier, MD -PCP Follow up in  1-2 weeks      Assessment & plan discussed with with attending physician and they are in agreement.    Mary Sella, ANP-C Stroke Neurology Team 04/08/2017 11:52 AM I have personally examined this patient, reviewed notes, independently viewed imaging studies, participated in medical decision making and plan of  care.ROS completed by me personally and pertinent positives fully documented  I have made any additions or clarifications directly to the above note. Agree with note above.  She presented with right leg weakness due to lacunar infarcts from small vessel disease. Recommend aggressive risk factor modification and dual antiplatelet therapy for 3 weeks followed by aspirin alone. Patient was counseled to quit smoking and she is agreeable Discussed with patient and Dr. Broadus John. Greater than 50% time during this 35 minute visit was spent on counseling and coordination of care about her lacunar infarcts and answering questions.  Antony Contras, MD Medical Director [facility] Pager: [phone] 04/08/2017 1:32 PM  Neurology to sign-off once ECHO and Carotid Duplex results are reviewed and without negative findings. Please call with any further questions or concerns. Thank you for this consultation.  To contact Stroke Continuity provider, please refer to [email or web address] After hours, contact General Neurology

## 2017-04-08 NOTE — Evaluation (Signed)
Physical Therapy Evaluation Patient Details Name: [name] MRN: [id number] DOB: 09-05-1965 Today's Date: 04/08/2017   History of Present Illness  This 52 y.o. female admitted with slurred speech, Rt facial droop, Rt sided weakness.  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 Pt currently with functional limitations due to the deficits listed below (see PT Problem List). PTA pt was fully independent driving and working part time as a [employer] fundraiser. Pt is currently limited by decreased R sided strength and decreased endurance. Pt requires supervision for bed mobility, minA for transfers, ambulation of 200 feet with RW and ascent/descent of 6 steps.  Pt will benefit from skilled acute PT to increase their independence and safety with mobility to allow discharge to the venue listed below.       Follow Up Recommendations Outpatient PT (Neuro if possible);Supervision/Assistance - 24 hour    Equipment Recommendations  Rolling walker with 5" wheels    Recommendations for Other Services       Precautions / Restrictions Precautions Precautions: Fall Required Braces or Orthoses: Other Brace/Splint(has walking boot for Lt LE ) Other Brace/Splint: has walking boot for Lt LE  Restrictions Weight Bearing Restrictions: No      Mobility  Bed Mobility Overal bed mobility: Needs Assistance Bed Mobility: Supine to Sit;Sit to Supine     Supine to sit: Supervision Sit to supine: Supervision   General bed mobility comments: with use of bedrails   Transfers Overall transfer level: Needs assistance Equipment used: Rolling walker (2 wheeled) Transfers: Sit to/from Stand Sit to Stand: Min assist         General transfer comment: assist to steady    Ambulation/Gait Ambulation/Gait assistance: Min assist Ambulation Distance (Feet): 200 Feet Assistive device: Rolling walker (2 wheeled) Gait Pattern/deviations: Step-through pattern;Decreased dorsiflexion - right;Decreased step length - right;Shuffle;Trunk flexed Gait velocity: slowed Gait velocity interpretation: Below normal speed for age/gender General Gait Details: minA for steadying with RW for 2x LoB, slow, mildly instable gait, decreased hip flexion causing R toe to catch, vc for increased knee flexion to clear R toes,for upright posture and increased BoS, pt required seated rest break after ambulation of 100 feet and again after practicing stairs  Stairs Stairs: Yes Stairs assistance: Min assist;Min guard Stair Management: Two rails;Step to pattern;Forwards;One rail Right Number of Stairs: 6 General stair comments: 3 steps with min guard and bilateral rails, min HHA for 3 steps with rail on L, 1x LoB for catching R toe on step    Modified Rankin (Stroke Patients Only) Modified Rankin (Stroke Patients Only) Pre-Morbid Rankin Score: No symptoms Modified Rankin: Moderately severe disability     Balance Overall balance assessment: Needs assistance Sitting-balance support: Feet supported;Single extremity supported Sitting balance-Leahy Scale: Poor     Standing balance support: Bilateral upper extremity supported Standing balance-Leahy Scale: Poor Standing balance comment: requires RW assist for steadying                             Pertinent Vitals/Pain Pain Assessment: No/denies pain    Home Living Family/patient expects to be discharged to:: Private residence Living Arrangements: Children Available Help at Discharge: Family;Available 24 hours/day Type of Home: House Home Access: Stairs to enter Entrance Stairs-Rails: Left Entrance Stairs-Number of Steps: 2-3  Home Layout: One level Home Equipment: Walker - 2 wheels;Cane - single point;Toilet  riser;Shower seat;Bedside commode Additional Comments: daughter is in school, but will convert to online classes in order to provide necessary level of care at discharge     Prior Function Level of Independence: Independent         Comments: Pt reports she ambulated without AD, denies falls with exception of the fall she experienced just PTA.  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  She is able to opose digit 4 with effort.  Denies numbness/tingling.   uses Rt UE as an active assist   RUE Coordination: decreased fine motor;decreased gross motor    Lower Extremity Assessment Lower Extremity Assessment: LLE deficits/detail;RLE deficits/detail RLE Deficits / Details: PROM WFL, strength grossly assessed as hip flexion 3/5, knee flexion 4/5 knee ext 4+/5, ankle plantar/dorsiflexion, 5/5 LLE Deficits / Details: L trans met amputation wears walking boot, knee and hip AROM WFL, strength grossly 4/5    Cervical / Trunk Assessment Cervical / Trunk Assessment: Normal  Communication   Communication: Expressive difficulties  Cognition Arousal/Alertness: Awake/alert Behavior During Therapy: WFL for tasks assessed/performed Overall Cognitive Status: Within Functional Limits for tasks assessed                                        General Comments General comments (skin integrity, edema, etc.): Pt daughter present during session and agrees to help with mobility at home     Assessment/Plan    PT Assessment Patient needs continued PT services  PT Problem List Decreased strength;Decreased range of motion;Decreased activity tolerance;Decreased balance;Decreased mobility;Decreased knowledge of use of DME;Decreased  safety awareness       PT Treatment Interventions DME instruction;Gait training;Stair training;Functional mobility training;Therapeutic activities;Therapeutic exercise;Balance training;Neuromuscular re-education;Cognitive remediation;Patient/family education    PT Goals (Current goals can be found in the Care Plan section)  Acute Rehab PT Goals Patient Stated Goal: to go home  PT Goal Formulation: With patient/family Time For Goal Achievement: 04/22/17 Potential to Achieve Goals: Good    Frequency Min 4X/week    AM-PAC PT "6 Clicks" Daily Activity  Outcome Measure Difficulty turning over in bed (including adjusting bedclothes, sheets and blankets)?: A Little Difficulty moving from lying on back to sitting on the side of the bed? : A Lot Difficulty sitting down on and standing up from a chair with arms (e.g., wheelchair, bedside commode, etc,.)?: Unable Help needed moving to and from a bed to chair (including a wheelchair)?: A Little Help needed walking in hospital room?: A Little Help needed climbing 3-5 steps with a railing? : A Little 6 Click Score: 15    End of Session Equipment Utilized During Treatment: Gait belt Activity Tolerance: Patient tolerated treatment well Patient left: in bed;with call bell/phone within reach;with family/visitor present Nurse Communication: Mobility status PT Visit Diagnosis: Unsteadiness on feet (R26.81);Other abnormalities of gait and mobility (R26.89);Difficulty in walking, not elsewhere classified (R26.2);Other symptoms and signs involving the nervous system (R29.898);Hemiplegia and hemiparesis Hemiplegia - Right/Left: Right Hemiplegia - dominant/non-dominant: Dominant Hemiplegia - caused by: Cerebral infarction    Time: 1232-1259 PT Time Calculation (min) (  ACUTE ONLY): 27 min   Charges:   PT Evaluation $PT Eval Moderate Complexity: 1 Mod PT Treatments $Gait Training: 8-22 mins   PT G Codes:        Tarance Balan B. [name] PT,  DPT Acute Rehabilitation  [phone] Pager [phone]    [place] 04/08/2017, 2:13 PM

## 2017-04-09 LAB — BASIC METABOLIC PANEL
Anion gap: 12 (ref 5–15)
BUN: 31 mg/dL — ABNORMAL HIGH (ref 6–20)
CO2: 23 mmol/L (ref 22–32)
Calcium: 8.7 mg/dL — ABNORMAL LOW (ref 8.9–10.3)
Chloride: 103 mmol/L (ref 101–111)
Creatinine, Ser: 3.29 mg/dL — ABNORMAL HIGH (ref 0.44–1.00)
GFR calc Af Amer: 18 mL/min — ABNORMAL LOW (ref >60)
GFR calc non Af Amer: 15 mL/min — ABNORMAL LOW (ref >60)
Glucose, Bld: 182 mg/dL — ABNORMAL HIGH (ref 65–99)
Potassium: 3.9 mmol/L (ref 3.5–5.1)
Sodium: 138 mmol/L (ref 135–145)

## 2017-04-09 LAB — GLUCOSE, CAPILLARY
Glucose-Capillary: 156 mg/dL — ABNORMAL HIGH (ref 65–99)
Glucose-Capillary: 217 mg/dL — ABNORMAL HIGH (ref 65–99)
Glucose-Capillary: 255 mg/dL — ABNORMAL HIGH (ref 65–99)
Glucose-Capillary: 165 mg/dL — ABNORMAL HIGH (ref 65–99)

## 2017-04-09 MED ORDER — SODIUM CHLORIDE 0.9 % IV SOLN
INTRAVENOUS | Status: DC
  Administered 2017-04-09 – 2017-04-10 (×2): via INTRAVENOUS

## 2017-04-09 NOTE — Progress Notes (Signed)
Inpatient Diabetes Program Recommendations  AACE/ADA: New Consensus Statement on Inpatient Glycemic Control (2015)  Target Ranges:  Prepandial:   less than 140 mg/dL      Peak postprandial:   less than 180 mg/dL (1-2 hours)      Critically ill patients:  140 - 180 mg/dL   Results for [name], [name] (MRN [id number]) as of 04/09/2017 14:55  Ref. Range 04/08/2017 07:25 04/08/2017 12:29 04/08/2017 16:48 04/08/2017 21:22 04/09/2017 06:17 04/09/2017 11:17  Glucose-Capillary Latest Ref Range: 65 - 99 mg/dL 285 (H) 204 (H) 324 (H) 210 (H) 156 (H) 217 (H)   Review of Glycemic Control  Outpatient Diabetes medications: Lantus 50-60 units QHS, Novolog 13 units TID with meals, Januvia 50 mg daily Current orders for Inpatient glycemic control: Lantus 55 units QHS, Novolog 0-15 units TID with meals, Novolog 0-5 units QHS  Inpatient Diabetes Program Recommendations: Insulin - Meal Coverage: Please consider ordering Novolog 4 units TID with meals for meal coverage if patient eats at least 50% of meals (in addition to Novolog correction). HgbA1C: A1C 9.8% on 04/08/17 which is improved from 13.3% on 02/16/17. However, patient needs to follow up with PCP to continue to improve DM control.  Thanks, Barnie Alderman, RN, MSN, CDE Diabetes Coordinator Inpatient Diabetes Program [phone] (Team Pager from 8am to 5pm)

## 2017-04-09 NOTE — Care Management Note (Signed)
Case Management Note  Patient Details  Name: [name] MRN: [id number] Date of Birth: [birth year]  Subjective/Objective:    Pt admitted with CVA. She is from home with her daughter.                Action/Plan: CM consulted for outpatient therapy. CM met with the patient and she would like to attend [facility]. Orders in Epic and information on the AVS.  Pt states her daughter can provide 24/7 supervision at home. She also states her daughter will provide transportation home when medically ready.  PCP: Dr Wynetta Emery Insurance: Medicaid   Expected Discharge Date:                  Expected Discharge Plan:  OP Rehab  In-House Referral:     Discharge planning Services  CM Consult  Post Acute Care Choice:    Choice offered to:     DME Arranged:    DME Agency:     HH Arranged:    [place] Agency:     Status of Service:  Completed, signed off  If discussed at [employer] of Stay Meetings, dates discussed:    Additional Comments:  Pollie Friar, RN 04/09/2017, 10:56 AM

## 2017-04-09 NOTE — Progress Notes (Signed)
  Speech Language Pathology Treatment: Dysphagia  Patient Details Name: [name] MRN: [id number] DOB: 1965/10/21 Today's Date: 04/09/2017 Time: 1552-0802 SLP Time Calculation (min) (ACUTE ONLY): 8 min  Assessment / Plan / Recommendation Clinical Impression  Reinforced chin tuck strategy with pt return demonstrated with success. Pt and daughter both report no coughing since starting this strategy. Also suggested a breath hold strategy may be equally helpful if pt did not want to tuck her chin in public. She tried this out with success and can experiment with it independently. No further acute interventions needed. Will sign off.   HPI HPI: pt is a 52 yo female adm to [facility] with right sided weakness and speech deficits.  MRI 04/07/17 Small acute/early subacute infarctions within the left posterior right limb of internal capsule and right splenium of corpus callosum.Marland Kitchen  Pt passed RNSSS but was coughing with intake of Sprite.  Pt report h/o sensing throat tightening when taking a medication for neuropathy - has since stopped this medication with resolution of symptoms.        SLP Plan  All goals met       Recommendations  Diet recommendations: Regular;Thin liquid Liquids provided via: Cup;Straw Medication Administration: Whole meds with puree Supervision: Patient able to self feed Compensations: Slow rate;Small sips/bites;Chin tuck                Follow up Recommendations: Outpatient SLP SLP Visit Diagnosis: Dysphagia, oropharyngeal phase (R13.12) Plan: All goals met       GO               Herbie Baltimore, MA CCC-SLP [phone]  Lynann Beaver 04/09/2017, 2:31 PM

## 2017-04-09 NOTE — Progress Notes (Signed)
Physical Therapy Treatment Patient Details Name: [name] MRN: [id number] DOB: [birth year] Today's Date: 04/09/2017    History of Present Illness This 52 y.o. female admitted with slurred speech, Rt facial droop, Rt sided weakness.  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 Pt currently requires supervision for bed mobility, and min guard for transfers and ambulation of 350 feet with RW. D/c plan remains appropriate. PT will continue to follow acutely.    Follow Up Recommendations  Outpatient PT;Supervision/Assistance - 24 hour     Equipment Recommendations  Rolling walker with 5" wheels       Precautions / Restrictions Precautions Precautions: Fall Required Braces or Orthoses: Other Brace/Splint(has walking boot for Lt LE ) Other Brace/Splint: has walking boot for Lt LE  Restrictions Weight Bearing Restrictions: No    Mobility  Bed Mobility Overal bed mobility: Needs Assistance Bed Mobility: Supine to Sit;Sit to Supine     Supine to sit: Supervision Sit to supine: Supervision   General bed mobility comments: with use of bedrails   Transfers Overall transfer level: Needs assistance Equipment used: Rolling walker (2 wheeled) Transfers: Sit to/from Stand Sit to Stand: Min guard         General transfer comment: min guard for safety  Ambulation/Gait Ambulation/Gait assistance: Min guard Ambulation Distance (Feet): 350 Feet Assistive device: Rolling walker (2 wheeled) Gait Pattern/deviations: Step-through pattern;Decreased dorsiflexion - right;Decreased step length - right;Shuffle;Trunk flexed Gait velocity: slowed Gait velocity interpretation: Below  normal speed for age/gender General Gait Details: hands on min guard for steadying with RW, slow, steady gait, with increased hip flexion for toe clearance, vc for upright posture which pt was able to maintiain throughout ambulation.     Modified Rankin (Stroke Patients Only) Modified Rankin (Stroke Patients Only) Pre-Morbid Rankin Score: No symptoms Modified Rankin: Moderately severe disability     Balance Overall balance assessment: Needs assistance Sitting-balance support: Feet supported;Single extremity supported Sitting balance-Leahy Scale: Fair     Standing balance support: Bilateral upper extremity supported Standing balance-Leahy Scale: Fair Standing balance comment: able to stand without assist                            Cognition Arousal/Alertness: Awake/alert Behavior During Therapy: WFL for tasks assessed/performed Overall Cognitive Status: Within Functional Limits for tasks assessed                                           General Comments General comments (skin integrity, edema, etc.): Pt daughter present throughout session       Pertinent Vitals/Pain Pain Assessment: No/denies pain           PT Goals (current goals can now be found in the care plan section) Acute Rehab PT Goals Patient Stated Goal: to go home  PT Goal Formulation: With patient/family Time For Goal Achievement: 04/22/17 Potential to Achieve Goals: Good    Frequency    Min 4X/week      PT Plan Current plan remains appropriate       AM-PAC PT "6  Clicks" Daily Activity  Outcome Measure  Difficulty turning over in bed (including adjusting bedclothes, sheets and blankets)?: A Little Difficulty moving from lying on back to sitting on the side of the bed? : A Lot Difficulty sitting down on and standing up from a chair with arms (e.g., wheelchair, bedside commode, etc,.)?: Unable Help needed moving to and from a bed to chair (including a wheelchair)?:  A Little Help needed walking in hospital room?: A Little Help needed climbing 3-5 steps with a railing? : A Little 6 Click Score: 15    End of Session Equipment Utilized During Treatment: Gait belt Activity Tolerance: Patient tolerated treatment well Patient left: in bed;with call bell/phone within reach;with family/visitor present Nurse Communication: Mobility status PT Visit Diagnosis: Unsteadiness on feet (R26.81);Other abnormalities of gait and mobility (R26.89);Difficulty in walking, not elsewhere classified (R26.2);Other symptoms and signs involving the nervous system (R29.898);Hemiplegia and hemiparesis Hemiplegia - Right/Left: Right Hemiplegia - dominant/non-dominant: Dominant Hemiplegia - caused by: Cerebral infarction     Time: 5956-3875 PT Time Calculation (min) (ACUTE ONLY): 15 min  Charges:  $Gait Training: 8-22 mins                    G Codes:       Beyonca Wisz B. [name] PT, DPT Acute Rehabilitation  [phone] Pager [phone]     [place] 04/09/2017, 4:49 PM

## 2017-04-09 NOTE — Progress Notes (Addendum)
Pharmacy Antibiotic Note  [name] is a 52 y.o. female admitted on 04/07/2017 with aspiration PNA.  Pharmacy has been consulted for Unasyn dosing.   Patient's renal function is worsening.   Afebrile, WBC WNL, no culture.   Plan: Change Unasyn to 3gm IV Q12H Monitor renal fxn, clinical progress, abx LOT  Height: 5\' 10"  (177.8 cm) Weight: 220 lb (99.8 kg) IBW/kg (Calculated) : 68.5  Temp (24hrs), Avg:98.3 F (36.8 C), Min:97.8 F (36.6 C), Max:98.8 F (37.1 C)  Recent Labs  Lab 04/07/17 1148 04/07/17 1329 04/08/17 0154 04/09/17 0612  WBC 9.0  --  8.4  --   CREATININE 2.55* 2.50* 2.63* 3.29*    Estimated Creatinine Clearance: 26.4 mL/min (A) (by C-G formula based on SCr of 3.29 mg/dL (H)).    Allergies  Allergen Reactions  . Lidocaine Itching  . Latex Rash    [name] D. Mina Marble, PharmD, BCPS Pager:  [phone] 04/09/2017, 10:30 AM   =======================  Addendum:  Unasyn d/c'ed   [name] D. Mina Marble, PharmD, BCPS Pager:  [phone] 04/09/2017, 10:32 AM

## 2017-04-09 NOTE — Progress Notes (Signed)
PROGRESS NOTE    [name]  KZL:[id number] DOB: [birth year] DOA: 04/07/2017 PCP: Ladell Pier, MD  Brief Narrative: [name] is a 52 y.o. female with a Past Medical History of HTN, DM, CKD3 and anxiety/depression who presents with neurologic symptoms concerning for CVA.  Patient started this AM with symptoms; on exam she continues to demonstrate slurred speech, R facial droop, and R sided weakness.  +LLL rhonchi on exam.  CT negative but MRI confirmed small acute/subacute infarctions in the L posterior limb of the internal capsule and R splenium of corpus callosum   Assessment & Plan:  Acute CVA  -small acute/subacute infarctions in the L posterior limb of the internal capsule and R splenium of corpus callosum noted on MRI -With resultant dysarthria and right-sided weakness -Appreciate neurology input suspected to be secondary to small vessel disease -Neurology consult appreciated, recommended to continue aspirin and Plavix combination for 3 weeks followed by aspirin alone -LDL is 101, start statin -2-D echocardiogram notes normal ejection fraction and grade 2 diastolic dysfunction, carotid duplex notes  1-39% ICA stenosis -PT OT and SLP evaluationscompleted, outpatient therapy recommended -Follow-up with neurology in 6 weeks  Acute kidney injury on chronic kidney disease stage III -Hydrate, normal saline at 75 mL an hour -Suspect hemodynamically mediated, ACE inhibitor and HCTZ on hold -B met in a.m., she is followed by Dr. Posey Pronto from [facility]  HTN -SBP in the 160-200 range, continue to hold antihypertensives at this time to allow permissive hypertension in the setting of acute CVA  -Holding ACE/CCB and HCTZ at this time  HLD LDL 101, started statin  DM -Hemoglobin A1 C is 9.8, hold Januvia, continue Lantus and sliding-scale insulin  Cough/URI/COPD -Started with URI,  5-6 days ago, has mild intermittent cough now, well before the onset of strokelike  symptoms -No wheezing, chest x-ray unremarkable -off antibiotics, stable  Chronic kidney disease stage 3-4 -Stable at baseline, avoid nephrotoxins, monitor  Depression/anxiety -Continue Cymbalta and nortriptyline  DVT prophylaxis: Lovenox Code Status: Full code Family Communication: Daughter at bedside Disposition Plan: Home tomorrow pending improvement in kidney function  Consultants:   Neurology   Procedures:   Antimicrobials:    Subjective: -Feels better, right-sided weakness improving, denies any shortness of breath, mild cough  Objective: Vitals:   04/08/17 2100 04/09/17 0157 04/09/17 0457 04/09/17 0942  BP: (!) 176/76 (!) 179/7 (!) 178/68 (!) 180/70  Pulse: 73 66 66 68  Resp:  '18 18 18  ' Temp: 98.3 F (36.8 C) 97.8 F (36.6 C) 98.1 F (36.7 C) 98.8 F (37.1 C)  TempSrc: Oral Oral Oral Oral  SpO2: 95% 94% 93% 95%  Weight:      Height:        Intake/Output Summary (Last 24 hours) at 04/09/2017 1403 Last data filed at 04/08/2017 2237 Gross per 24 hour  Intake 597 ml  Output -  Net 597 ml   Filed Weights   04/07/17 1123  Weight: 99.8 kg (220 lb)    Examination:  Gen: Awake, Alert, Oriented X 3,  HEENT: PERRLA, Neck supple, no JVD Lungs: Improved air movement, no wheezes CVS: RRR,No Gallops,Rubs or new Murmurs Abd: soft, Non tender, non distended, BS present Extremities: No Cyanosis, Clubbing or edema Skin: no new rashes Neuro: Alert oriented, right upper extremity is 4+ with mild pronator drift, right lower extremity is 4/5, improved light touch, DTR 2+  Psychiatry: Judgement and insight appear normal. Mood & affect appropriate.     Data Reviewed:  CBC: Recent Labs  Lab 04/07/17 1148 04/07/17 1329 04/08/17 0154  WBC 9.0  --  8.4  NEUTROABS 6.6  --   --   HGB 12.7 15.3* 11.9*  HCT 39.6 45.0 37.3  MCV 86.3  --  86.3  PLT 239  --  620   Basic Metabolic Panel: Recent Labs  Lab 04/07/17 1148 04/07/17 1329 04/08/17 0154 04/09/17 0612   NA 134* 138 138 138  K 4.1 4.2 3.8 3.9  CL 101 102 101 103  CO2 20*  --  22 23  GLUCOSE 364* 324* 295* 182*  BUN 24* 26* 25* 31*  CREATININE 2.55* 2.50* 2.63* 3.29*  CALCIUM 8.6*  --  8.7* 8.7*   GFR: Estimated Creatinine Clearance: 26.4 mL/min (A) (by C-G formula based on SCr of 3.29 mg/dL (H)). Liver Function Tests: Recent Labs  Lab 04/07/17 1148  AST 45*  ALT 45  ALKPHOS 129*  BILITOT 0.5  PROT 6.8  ALBUMIN 3.2*   No results for input(s): LIPASE, AMYLASE in the last 168 hours. No results for input(s): AMMONIA in the last 168 hours. Coagulation Profile: Recent Labs  Lab 04/07/17 1148  INR 0.99   Cardiac Enzymes: No results for input(s): CKTOTAL, CKMB, CKMBINDEX, TROPONINI in the last 168 hours. BNP (last 3 results) No results for input(s): PROBNP in the last 8760 hours. HbA1C: Recent Labs    04/08/17 0154  HGBA1C 9.8*   CBG: Recent Labs  Lab 04/08/17 1229 04/08/17 1648 04/08/17 2122 04/09/17 0617 04/09/17 1117  GLUCAP 204* 324* 210* 156* 217*   Lipid Profile: Recent Labs    04/08/17 0154  CHOL 205*  HDL 26*  LDLCALC 101*  TRIG 389*  CHOLHDL 7.9   Thyroid Function Tests: Recent Labs    04/07/17 1936  TSH 2.862   Anemia Panel: No results for input(s): VITAMINB12, FOLATE, FERRITIN, TIBC, IRON, RETICCTPCT in the last 72 hours. Urine analysis:    Component Value Date/Time   COLORURINE YELLOW 03/29/2014 [place] 03/29/2014 1259   LABSPEC 1.020 03/29/2014 1259   PHURINE 7.5 03/29/2014 1259   GLUCOSEU NEGATIVE 03/29/2014 1259   HGBUR TRACE-INTACT (A) 03/29/2014 1259   BILIRUBINUR neg 02/16/2017 1501   KETONESUR NEGATIVE 03/29/2014 1259   PROTEINUR >=30 02/16/2017 1501   UROBILINOGEN 0.2 02/16/2017 1501   UROBILINOGEN 0.2 03/29/2014 1259   NITRITE neg 02/16/2017 1501   NITRITE NEGATIVE 03/29/2014 1259   LEUKOCYTESUR Negative 02/16/2017 1501   Sepsis Labs: '@LABRCNTIP' (procalcitonin:4,lacticidven:4)  )No results found  for this or any previous visit (from the past 240 hour(s)).       Radiology Studies: Mr Virgel Paling BT Contrast  Result Date: 04/07/2017 CLINICAL DATA:  52 y/o F; slurred speech and right-sided body weakness. EXAM: MRI HEAD WITHOUT CONTRAST MRA HEAD WITHOUT CONTRAST TECHNIQUE: Sagittal T1 FLAIR, axial DWI, coronal DWI, axial T2 sequences MRI head were acquired. Angiographic images of the head were obtained using MRA technique without contrast. COMPARISON:  04/07/2017 CT head FINDINGS: MRI HEAD FINDINGS Brain: Small foci of reduced diffusion are present within the left posterior limb of internal capsule and right splenium of corpus callosum. No focal mass effect. No hydrocephalus, extra-axial collection, or effacement of basilar cisterns identified. Motion degraded axial T2 and sagittal T1 sequences. Vascular: As below. Skull and upper cervical spine: Normal marrow signal. Sinuses/Orbits: Negative. Other: None. MRA HEAD FINDINGS Internal carotid arteries: Patent. Mild irregularity of carotid siphons compatible with atherosclerosis with mild left cavernous and right paraclinoid stenosis. 2 mm inferiorly  directed outpouching of right cavernous ICA, likely prominent infundibulum of diminutive vessel. Anterior cerebral arteries:  Patent. Middle cerebral arteries: Patent. Anterior communicating artery: Patent. Posterior communicating arteries: Patent right. No left identified, likely hypoplastic or absent. Posterior cerebral arteries: Patent. Left P2 short segment of mild stenosis. Basilar artery:  Patent.  Lower basilar fenestration. Vertebral arteries:  Patent. No evidence of high-grade stenosis, large vessel occlusion, or aneurysm unless noted above. IMPRESSION: 1. Small acute/early subacute infarctions within the left posterior limb of internal capsule and right splenium of corpus callosum. 2. Patent anterior and posterior intracranial circulation. No large vessel occlusion, high-grade stenosis, or aneurysm. 3.  Intracranial atherosclerosis with multiple segments of mild stenosis. These results were called by telephone at the time of interpretation on 04/07/2017 at 3:58 pm to PA Law , who verbally acknowledged these results. Electronically Signed   By: Kristine Garbe M.D.   On: 04/07/2017 16:03   Mr Brain Wo Contrast  Result Date: 04/07/2017 CLINICAL DATA:  52 y/o F; slurred speech and right-sided body weakness. EXAM: MRI HEAD WITHOUT CONTRAST MRA HEAD WITHOUT CONTRAST TECHNIQUE: Sagittal T1 FLAIR, axial DWI, coronal DWI, axial T2 sequences MRI head were acquired. Angiographic images of the head were obtained using MRA technique without contrast. COMPARISON:  04/07/2017 CT head FINDINGS: MRI HEAD FINDINGS Brain: Small foci of reduced diffusion are present within the left posterior limb of internal capsule and right splenium of corpus callosum. No focal mass effect. No hydrocephalus, extra-axial collection, or effacement of basilar cisterns identified. Motion degraded axial T2 and sagittal T1 sequences. Vascular: As below. Skull and upper cervical spine: Normal marrow signal. Sinuses/Orbits: Negative. Other: None. MRA HEAD FINDINGS Internal carotid arteries: Patent. Mild irregularity of carotid siphons compatible with atherosclerosis with mild left cavernous and right paraclinoid stenosis. 2 mm inferiorly directed outpouching of right cavernous ICA, likely prominent infundibulum of diminutive vessel. Anterior cerebral arteries:  Patent. Middle cerebral arteries: Patent. Anterior communicating artery: Patent. Posterior communicating arteries: Patent right. No left identified, likely hypoplastic or absent. Posterior cerebral arteries: Patent. Left P2 short segment of mild stenosis. Basilar artery:  Patent.  Lower basilar fenestration. Vertebral arteries:  Patent. No evidence of high-grade stenosis, large vessel occlusion, or aneurysm unless noted above. IMPRESSION: 1. Small acute/early subacute infarctions within  the left posterior limb of internal capsule and right splenium of corpus callosum. 2. Patent anterior and posterior intracranial circulation. No large vessel occlusion, high-grade stenosis, or aneurysm. 3. Intracranial atherosclerosis with multiple segments of mild stenosis. These results were called by telephone at the time of interpretation on 04/07/2017 at 3:58 pm to PA Law , who verbally acknowledged these results. Electronically Signed   By: Kristine Garbe M.D.   On: 04/07/2017 16:03   Dg Swallowing Func-speech Pathology  Result Date: 04/08/2017 Objective Swallowing Evaluation: Type of Study: MBS-Modified Barium Swallow Study  Patient Details Name: BERNADEAN SALING MRN: [id number] Date of Birth: 02-04-1966 Today's Date: 04/08/2017 Time: SLP Start Time (ACUTE ONLY): 1140 -SLP Stop Time (ACUTE ONLY): 1200 SLP Time Calculation (min) (ACUTE ONLY): 20 min Past Medical History: Past Medical History: Diagnosis Date . Anxiety 2002 . Chest tightness  . Depression 2001 . Diabetes type 1, uncontrolled ([place])   at age 58 . Diabetic neuropathy ([place])  . Essential hypertension 2015 . GERD (gastroesophageal reflux disease)   about age of 13 . Nausea and vomiting in adult  . Urinary frequency  . Yeast vaginitis  Past Surgical History: Past Surgical History: Procedure Laterality Date . ACHILLES TENDON SURGERY  Left 03/10/2013  Procedure: LEFT CHOPART AMPUTATION/ LEFT TENDON ACHILLES [place];  Surgeon: Wylene Simmer, MD;  Location: [place] [facility];  Service: Orthopedics;  Laterality: Left; . AMPUTATION Left 06/15/2012  Procedure: AMPUTATION DIGIT;  Surgeon: Meredith Pel, MD;  Location: [facility];  Service: Orthopedics;  Laterality: Left;  Left great toe revision amputation . ENDOMETRIAL ABLATION   . LAPAROSCOPIC CHOLECYSTECTOMY  01/30/2015  [facility] day surgery  HPI: pt is a 52 yo female adm to [facility] with right sided weakness and speech deficits.  MRI 04/07/17 Small acute/early subacute infarctions within the left posterior right limb of  internal capsule and right splenium of corpus callosum.Marland Kitchen  Pt passed RNSSS but was coughing with intake of Sprite.  Pt report h/o sensing throat tightening when taking a medication for neuropathy - has since stopped this medication with resolution of symptoms.   Subjective: pt awake in bed, daughter present Assessment / Plan / Recommendation CHL IP CLINICAL IMPRESSIONS 04/08/2017 Clinical Impression Pt demonstrates slightly delayed/partially incomplete airway closure before/during the swallow leading to trace flash penetration and one instance of sensed aspiration. A chin tuck provided slightly extra time for complete airway closure, even with consecutive straw sips. Pt was able to return demonstrate. Otherwise swallow function WNL. Recommend pt consume a regular diet with thin liquids with a chin tuck, pills whole in puree.  SLP Visit Diagnosis Dysphagia, oropharyngeal phase (R13.12) Attention and concentration deficit following -- Frontal lobe and executive function deficit following -- Impact on safety and function Mild aspiration risk   CHL IP TREATMENT RECOMMENDATION 04/08/2017 Treatment Recommendations Therapy as outlined in treatment plan below   Prognosis 04/08/2017 Prognosis for Safe Diet Advancement Good Barriers to Reach Goals -- Barriers/Prognosis Comment -- CHL IP DIET RECOMMENDATION 04/08/2017 SLP Diet Recommendations Regular solids;Thin liquid Liquid Administration via Straw Medication Administration Whole meds with puree Compensations Slow rate;Small sips/bites;Chin tuck Postural Changes Remain semi-upright after after feeds/meals (Comment);Seated upright at 90 degrees   CHL IP OTHER RECOMMENDATIONS 04/08/2017 Recommended Consults -- Oral Care Recommendations Patient independent with oral care Other Recommendations Order thickener from pharmacy   CHL IP FOLLOW UP RECOMMENDATIONS 04/08/2017 Follow up Recommendations Inpatient Rehab   CHL IP FREQUENCY AND DURATION 04/08/2017 Speech Therapy Frequency (ACUTE ONLY) min  2x/week Treatment Duration 2 weeks      CHL IP ORAL PHASE 04/08/2017 Oral Phase WFL Oral - Pudding Teaspoon -- Oral - Pudding Cup -- Oral - Honey Teaspoon -- Oral - Honey Cup -- Oral - Nectar Teaspoon -- Oral - Nectar Cup -- Oral - Nectar Straw -- Oral - Thin Teaspoon -- Oral - Thin Cup -- Oral - Thin Straw -- Oral - Puree -- Oral - Mech Soft -- Oral - Regular -- Oral - Multi-Consistency -- Oral - Pill -- Oral Phase - Comment --  CHL IP PHARYNGEAL PHASE 04/08/2017 Pharyngeal Phase Impaired Pharyngeal- Pudding Teaspoon -- Pharyngeal -- Pharyngeal- Pudding Cup -- Pharyngeal -- Pharyngeal- Honey Teaspoon -- Pharyngeal -- Pharyngeal- Honey Cup -- Pharyngeal -- Pharyngeal- Nectar Teaspoon -- Pharyngeal -- Pharyngeal- Nectar Cup -- Pharyngeal -- Pharyngeal- Nectar Straw -- Pharyngeal -- Pharyngeal- Thin Teaspoon -- Pharyngeal -- Pharyngeal- Thin Cup Penetration/Aspiration before swallow;Penetration/Aspiration during swallow;Reduced airway/laryngeal closure;Trace aspiration Pharyngeal Material enters airway, passes BELOW cords and not ejected out despite cough attempt by patient Pharyngeal- Thin Straw Penetration/Aspiration during swallow;Reduced airway/laryngeal closure;Compensatory strategies attempted (with notebox) Pharyngeal Material enters airway, remains ABOVE vocal cords then ejected out;Material does not enter airway Pharyngeal- Puree WFL Pharyngeal -- Pharyngeal- Mechanical Soft -- Pharyngeal --  Pharyngeal- Regular WFL Pharyngeal -- Pharyngeal- Multi-consistency -- Pharyngeal -- Pharyngeal- Pill WFL;Compensatory strategies attempted (with notebox) Pharyngeal -- Pharyngeal Comment --  No flowsheet data found. No flowsheet data found. Herbie [place], [place] CCC-SLP [phone] [place], [name] 04/08/2017, 2:38 PM              Vas US Carotid (at [place] Only)  Result Date: 04/08/2017 Carotid Arterial Duplex Study Indications:   Speech disturbance. Risk Factors:  Hypertension. Other Factors: Type I Diabetes Mellitus.  Examination Guidelines: A complete evaluation includes B-mode imaging, spectral doppler, color doppler, and power doppler as needed of all accessible portions of each vessel. Bilateral testing is considered an integral part of a complete examination. Limited examinations for reoccurring indications may be performed as noted.  Right Carotid Findings: +----------+-------+-------+--------+---------------------------------+--------+           PSV    EDV    StenosisDescribe                         Comments           cm/s   cm/s                                                     +----------+-------+-------+--------+---------------------------------+--------+ CCA Prox  103    10                                                       +----------+-------+-------+--------+---------------------------------+--------+ CCA Distal-65    -14            irregular, heterogenous and                                               calcific                                  +----------+-------+-------+--------+---------------------------------+--------+ ICA Prox  -104   -19            heterogenous, irregular and                                               calcific                                  +----------+-------+-------+--------+---------------------------------+--------+ ICA Distal-74    -22                                                      +----------+-------+-------+--------+---------------------------------+--------+ ECA       -343   -22            heterogenous, irregular and  calcific                                  +----------+-------+-------+--------+---------------------------------+--------+ +----------+--------+-------+----------------+-------------------+           PSV cm/sEDV cmsDescribe        Arm Pressure (mmHG) +----------+--------+-------+----------------+-------------------+ [id number]             Multiphasic, WNL                    +----------+--------+-------+----------------+-------------------+ +---------+--------+--+--------+--+---------+ VertebralPSV cm/s83EDV cm/s21Antegrade +---------+--------+--+--------+--+---------+  Left Carotid Findings: +----------+-------+-------+--------+------------------------+-----------------+           PSV    EDV    StenosisDescribe                Comments                    cm/s   cm/s                                                     +----------+-------+-------+--------+------------------------+-----------------+ CCA Prox  67     16                                     intimal                                                                   thickening        +----------+-------+-------+--------+------------------------+-----------------+ CCA Distal-50    -17                                                      +----------+-------+-------+--------+------------------------+-----------------+ ICA Prox  -59    -15                                                      +----------+-------+-------+--------+------------------------+-----------------+ ICA Distal-125   -46                                                      +----------+-------+-------+--------+------------------------+-----------------+ ECA       -15                   irregular and  heterogenous                              +----------+-------+-------+--------+------------------------+-----------------+ +----------+--------+--------+----------------+-------------------+ SubclavianPSV cm/sEDV cm/sDescribe        Arm Pressure (mmHG) +----------+--------+--------+----------------+-------------------+           227             Multiphasic, WNL                    +----------+--------+--------+----------------+-------------------+  +---------+--------+--+--------+--+---------+ VertebralPSV cm/s76EDV cm/s19Antegrade +---------+--------+--+--------+--+---------+  Final Interpretation: Right Carotid: Velocities in the right ICA are consistent with a 1-39% stenosis. Left Carotid: Velocities in the left ICA are consistent with a 1-39% stenosis.  *See table(s) above for measurements and observations.  Electronically signed by Ruta Hinds on 04/08/2017 at 4:10:38 PM.         Scheduled Meds: . aspirin EC  81 mg Oral Daily  . atorvastatin  40 mg Oral q1800  . clopidogrel  75 mg Oral Daily  . diphenoxylate-atropine  2 tablet Oral TID AC  . DULoxetine  60 mg Oral Daily  . insulin aspart  0-15 Units Subcutaneous TID WC  . insulin aspart  0-5 Units Subcutaneous QHS  . insulin glargine  55 Units Subcutaneous Q2200  . nortriptyline  75 mg Oral QHS  . pantoprazole  40 mg Oral Q1200  . zolpidem  5 mg Oral QHS   Continuous Infusions: . sodium chloride 75 mL/hr at 04/09/17 0901     LOS: 2 days    Time spent: 54mn    PDomenic Polite MD [facility] Page via [email or web address], password [email or web address] After 7PM please contact night-coverage  04/09/2017, 2:03 PM

## 2017-04-10 ENCOUNTER — Other Ambulatory Visit: Payer: Self-pay

## 2017-04-10 LAB — BASIC METABOLIC PANEL
Anion gap: 11 (ref 5–15)
BUN: 33 mg/dL — ABNORMAL HIGH (ref 6–20)
CO2: 21 mmol/L — ABNORMAL LOW (ref 22–32)
Calcium: 8.4 mg/dL — ABNORMAL LOW (ref 8.9–10.3)
Chloride: 106 mmol/L (ref 101–111)
Creatinine, Ser: 2.87 mg/dL — ABNORMAL HIGH (ref 0.44–1.00)
GFR calc Af Amer: 21 mL/min — ABNORMAL LOW (ref >60)
GFR calc non Af Amer: 18 mL/min — ABNORMAL LOW (ref >60)
Glucose, Bld: 176 mg/dL — ABNORMAL HIGH (ref 65–99)
Potassium: 4 mmol/L (ref 3.5–5.1)
Sodium: 138 mmol/L (ref 135–145)

## 2017-04-10 LAB — GLUCOSE, CAPILLARY: Glucose-Capillary: 149 mg/dL — ABNORMAL HIGH (ref 65–99)

## 2017-04-10 MED ORDER — ASPIRIN 81 MG PO TBEC: 81.0000 mg | DELAYED_RELEASE_TABLET | Freq: Every day | ORAL | 0 refills | Status: DC

## 2017-04-10 MED ORDER — ATORVASTATIN CALCIUM 40 MG PO TABS: 40.0000 mg | ORAL_TABLET | Freq: Every day | ORAL | 0 refills | Status: DC

## 2017-04-10 MED ORDER — CLOPIDOGREL BISULFATE 75 MG PO TABS: 75.0000 mg | ORAL_TABLET | Freq: Every day | ORAL | 0 refills | Status: AC

## 2017-04-10 MED ORDER — HYDRALAZINE HCL 50 MG PO TABS: 50.0000 mg | ORAL_TABLET | Freq: Two times a day (BID) | ORAL | 0 refills | Status: DC

## 2017-04-10 MED ORDER — AMLODIPINE BESYLATE 10 MG PO TABS
10.0000 mg | ORAL_TABLET | Freq: Every day | ORAL | Status: DC
  Administered 2017-04-10: 10 mg via ORAL
  Filled 2017-04-10: qty 1

## 2017-04-10 MED ORDER — HYDRALAZINE HCL 25 MG PO TABS
25.0000 mg | ORAL_TABLET | Freq: Three times a day (TID) | ORAL | Status: DC
  Administered 2017-04-10: 25 mg via ORAL
  Filled 2017-04-10: qty 1

## 2017-04-10 NOTE — Care Management Note (Signed)
Case Management Note  Patient Details  Name: MIALYNN SHELVIN MRN: [id number] Date of Birth: Jun 15, 1965  Subjective/Objective:                    Action/Plan: Pt already set up with outpatient therapy. Pt with orders for rolling walker today. CM notified Butch Penny with [facility] DME and she will deliver the equipment to the room.  Pt has supervision and transportation home.    Expected Discharge Date:  04/10/17               Expected Discharge Plan:  OP Rehab  In-House Referral:     Discharge planning Services  CM Consult  Post Acute Care Choice:  Durable Medical Equipment Choice offered to:  Patient  DME Arranged:  [facility] rolling DME Agency:  [place]:    [place]:     Status of Service:  Completed, signed off  If discussed at [place] of Stay Meetings, dates discussed:    Additional Comments:  Pollie Friar, RN 04/10/2017, 10:39 AM

## 2017-04-10 NOTE — Progress Notes (Signed)
Occupational Therapy Treatment Patient Details Name: [name] MRN: [id number] DOB: 04-24-1965 Today's Date: 04/10/2017    History of present illness This 52 y.o. female admitted with slurred speech, Rt facial droop, Rt sided weakness.  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 Pt to continue with outpt OT.  Follow Up Recommendations  Outpatient OT;Supervision/Assistance - 24 hour    Equipment Recommendations  None recommended by OT    Recommendations for Other Services      Precautions / Restrictions Precautions Precautions: Fall Other Brace/Splint: has walking boot for Lt LE        Mobility Bed Mobility                  Transfers                      Balance                                           ADL either performed or assessed with clinical judgement   ADL                                               Vision       Perception     Praxis      Cognition Arousal/Alertness: Awake/alert Behavior During Therapy: WFL for tasks assessed/performed Overall Cognitive Status: Within Functional Limits for tasks assessed                                          Exercises Exercises: General Upper Extremity;Hand exercises;Other exercises Other Exercises Other Exercises: Educated pt on RUE strengthening [place] ign PNF patterns Other Exercises: Educated on theraputty exercises - yellow; handout reviewed Other Exercises: Educate don fine motor activities   Shoulder Instructions       General Comments      Pertinent Vitals/ Pain       Pain Assessment: No/denies pain  Home Living                                          Prior  Functioning/Environment              Frequency  Min 3X/week        Progress Toward Goals  OT Goals(current goals can now be found in the care plan section)  Progress towards OT goals: Progressing toward goals  Acute Rehab OT Goals Patient Stated Goal: to go home  OT Goal Formulation: With patient Time For Goal Achievement: 04/22/17 Potential to Achieve Goals: Good ADL Goals Pt Will Perform Grooming: with supervision;standing Pt Will Perform Upper Body Bathing: with set-up;sitting Pt Will Perform Lower Body Bathing: with supervision Pt Will Perform Upper Body Dressing: with supervision;sitting Pt Will Perform Lower Body Dressing: with supervision;sit to/from stand Pt Will Transfer to Toilet: ambulating;with  min guard assist Pt Will Perform Toileting - Clothing Manipulation and hygiene: with supervision;sit to/from stand Pt Will Perform Tub/Shower Transfer: Tub transfer;with min assist;ambulating;shower seat;rolling walker Pt/caregiver will Perform Home Exercise Program: Increased ROM;Right Upper extremity;With written HEP provided;Independently  Plan Discharge plan remains appropriate    Co-evaluation                 AM-PAC PT "6 Clicks" Daily Activity     Outcome Measure   Help from another person eating meals?: A Little Help from another person taking care of personal grooming?: A Little Help from another person toileting, which includes using toliet, bedpan, or urinal?: A Little Help from another person bathing (including washing, rinsing, drying)?: A Little Help from another person to put on and taking off regular upper body clothing?: A Little Help from another person to put on and taking off regular lower body clothing?: A Little 6 Click Score: 18    End of Session    OT Visit Diagnosis: Unsteadiness on feet (R26.81);Hemiplegia and hemiparesis Hemiplegia - Right/Left: Right Hemiplegia - dominant/non-dominant: Dominant Hemiplegia - caused by: Cerebral  infarction   Activity Tolerance Patient tolerated treatment well   Patient Left in bed;with call bell/phone within reach   Nurse Communication Other (comment)(ready for DC)        Time: 6226-3335 OT Time Calculation (min): 14 min  Charges: OT General Charges $OT Visit: 1 Visit OT Treatments $Therapeutic Activity: 8-22 mins  [facility], OT/L  [phone] 04/10/2017   [name],[name] 04/10/2017, 11:29 AM

## 2017-04-14 NOTE — Discharge Summary (Addendum)
Physician Discharge Summary  [name] JME:[id number] DOB: 1965/10/11 DOA: 04/07/2017  PCP: Donna Pier, MD  Admit date: 04/07/2017 Discharge date: 04/10/2017  Time spent: 45 minutes  Recommendations for Outpatient Follow-up:  1. Neurology Dr. Leonie Martinez in 6 weeks 2. PCP in one week 3. Follow-up with nephrology Dr. Posey Martinez in 2-3 weeks   Discharge Diagnoses:  Principal Problem:   Acute right-sided weakness   Acute CVA   TOBACCO DEPENDENCE   Essential hypertension, benign   Diabetes mellitus type 2, uncontrolled, with complications ([place])   CKD stage 3 due to type 2 diabetes mellitus ([place])   Slurred speech   Right sided weakness   Cerebral thrombosis with cerebral infarction   Discharge Condition: stable  Diet recommendation: diabetic low sodium heart healthy  Filed Weights   04/07/17 1123  Weight: 99.8 kg (220 lb)    History of present illness:  [name] a 52 y.o.femalewith a Past Medical History of HTN, DM, CKD3 and anxiety/depression who presented with neurologic symptoms concerning for CVA. slurred speech, R facial droop, and R sided weakness.   Martinez Course:   Acute CVA  -admitted with slurring of speech, right facial droop and right-sided hemiplegia -MRI noted small acute/subacute infarctions in the L posterior limb of the internal capsule and R splenium of corpus callosum noted on MRI, with resultant dysarthria and right-sided weakness -Neurology consulted, stroke suspected to be secondary to small vessel disease -Neurology recommended to treat with aspirin and Plavix combination for 3 weeks followed by aspirin alone -LDL is 101, started statin -2-D echocardiogram notes normal ejection fraction and grade 2 diastolic dysfunction, carotid duplex notes  1-39% ICA stenosis -PT OT and SLP evaluationscompleted, outpatient therapy recommended -Follow-up with neurology in 6 weeks  Acute kidney injury on chronic kidney disease stage III -Baseline  creatinine around 2,creatinine worsened to 3.2 after admission -hydrated with normal saline, ACE inhibitor discontinued -Creatinine improved to 2.8 at discharge -Advise close follow-up with her nephrologist Dr. Posey Martinez with [facility] in 2-3 weeks  HTN -initially antihypertensives held to allow for permissible hypertension -slowly restarted home regimen except lisinopril which was discontinued due to acute kidney injury  HLD LDL 101, started statin  DM type 2 -Hemoglobin A1 C is 9.8,resumed Januvia and Lantus  Cough/URI/COPD -Started with URI,  5-6 days ago, has mild intermittent cough now, well before the onset of strokelike symptoms -No wheezing, chest x-ray unremarkable -off antibiotics, stable  Depression/anxiety -Continue Cymbalta and nortriptyline    Consultations: Neurology DonnaSethi  Discharge Exam: Vitals:   04/10/17 0526 04/10/17 1010  BP: (!) 208/78 (!) 195/69  Pulse: (!) 58 66  Resp: 18 18  Temp: 98.4 F (36.9 C) 98.8 F (37.1 C)  SpO2: 96% 95%    General: AAO 3 Cardiovascular: S1-S2/regular rate rhythm Respiratory: clear bilaterally  Discharge Instructions   Discharge Instructions    Ambulatory referral to Neurology   Complete by:  As directed    An appointment is requested in approximately: 6 weeks Follow up with stroke clinic (Dr [name] preferred, if not available, then consider [name], [name] or [name] whoever is available) at [name] [place] in about 6-8 weeks. Thanks.   Ambulatory referral to Occupational Therapy   Complete by:  As directed    Ambulatory referral to Physical Therapy   Complete by:  As directed    Diet - low sodium heart healthy   Complete by:  As directed    Diet Carb Modified   Complete by:  As  directed    Increase activity slowly   Complete by:  As directed      Allergies as of 04/10/2017      Reactions   Lidocaine Itching   Latex Rash      Medication List    STOP taking these medications    lisinopril 40 MG tablet Commonly known as:  PRINIVIL,ZESTRIL   NYQUIL PO   SUMAtriptan 50 MG tablet Commonly known as:  IMITREX     TAKE these medications   ACCU-CHEK AVIVA PLUS w/Device Kit CHECK BLOOD SUGARS THREE TIMES A DAY.  DX:250.03   acetaminophen-codeine 300-60 MG tablet Commonly known as:  TYLENOL #4 TAKE ONE TABLET BY MOUTH EVERY 8 HOURS AS NEEDED MOD  PAIN What changed:    how much to take  how to take this  when to take this  additional instructions   aluminum chloride 20 % external solution Commonly known as:  DRYSOL Apply topically at bedtime.   amLODipine 10 MG tablet Commonly known as:  NORVASC Take 1 tablet (10 mg total) by mouth daily.   aspirin 81 MG EC tablet Take 1 tablet (81 mg total) by mouth daily.   atorvastatin 40 MG tablet Commonly known as:  LIPITOR Take 1 tablet (40 mg total) by mouth daily at 6 PM.   calcium citrate-vitamin D 500-400 MG-UNIT chewable tablet Chew 1 tablet by mouth 2 (two) times daily.   clopidogrel 75 MG tablet Commonly known as:  PLAVIX Take 1 tablet (75 mg total) by mouth daily for 21 days. Take this for 3weeks then STOP   [employer] VIT D 5000 HIGH-POTENCY 5000 units capsule Generic drug:  Cholecalciferol Take 5,000 Units by mouth daily.   cyclobenzaprine 10 MG tablet Commonly known as:  FLEXERIL Take 1 tablet (10 mg total) by mouth 3 (three) times daily as needed for muscle spasms.   diphenoxylate-atropine 2.5-0.025 MG tablet Commonly known as:  LOMOTIL Take 2 tablets by mouth 3 (three) times daily before meals. Please take thirty (30) minutes prior to meal.   DULoxetine 60 MG capsule Commonly known as:  CYMBALTA Take 1 capsule (60 mg total) by mouth daily.   fluticasone 50 MCG/ACT nasal spray Commonly known as:  FLONASE Place 2 sprays into both nostrils daily.   glucose blood test strip Commonly known as:  ACCU-CHEK AVIVA PLUS USE TO CHECK BLOOD SUGAR BEFORE MEALS AND AT BEDTIME dx code E10.65    hydrALAZINE 50 MG tablet Commonly known as:  APRESOLINE Take 1 tablet (50 mg total) by mouth 2 (two) times daily.   hydrochlorothiazide 25 MG tablet Commonly known as:  HYDRODIURIL Take 1 tablet (25 mg total) by mouth daily.   insulin aspart 100 UNIT/ML FlexPen Commonly known as:  NOVOLOG FLEXPEN Inject 13 Units into the skin 3 (three) times daily with meals.   Insulin Glargine 100 UNIT/ML Solostar Pen Commonly known as:  LANTUS SOLOSTAR INJECT 55 UNITS SUBCUTANEOUSLY ONCE DAILY What changed:    how much to take  how to take this  when to take this  additional instructions   promethazine 25 MG tablet Commonly known as:  PHENERGAN Take 1 tablet (25 mg total) by mouth every 8 (eight) hours as needed for nausea or vomiting.   sitaGLIPtin 50 MG tablet Commonly known as:  JANUVIA Take 1 tablet (50 mg total) by mouth daily.   zolpidem 5 MG tablet Commonly known as:  AMBIEN TAKE ONE TABLET BY MOUTH ONCE DAILY AT BEDTIME AS NEEDED FOR SLEEP  Allergies  Allergen Reactions  . Lidocaine Itching  . Latex Rash   Follow-up Information    Garvin Fila, MD. Schedule an appointment as soon as possible for a visit in 6 week(s).   Specialties:  Neurology, Radiology Contact information: [place] [address] [place] [facility].   Specialty:  Rehabilitation Why:  They will contact you for the first appointment Contact information: [address] [place]       Donna Pier, MD. Schedule an appointment as soon as possible for a visit in 1 week(s).   Specialty:  Internal Medicine Why:  please check Bmet at FU Contact information: [place] [address] [phone]            The results of significant diagnostics from this hospitalization (including imaging, microbiology,  ancillary and laboratory) are listed below for reference.    Significant Diagnostic Studies: Dg Chest 2 View  Result Date: 04/07/2017 CLINICAL DATA:  Acute facial droop, slurred speech and right-sided weakness for 1 day. Cough, shortness of breath and fever for 3 days. EXAM: CHEST  2 VIEW COMPARISON:  06/14/2012 radiographs FINDINGS: The cardiomediastinal silhouette is unremarkable. Peribronchial thickening has increased. There is no evidence of focal airspace disease, pulmonary edema, suspicious pulmonary nodule/mass, pleural effusion, or pneumothorax. No acute bony abnormalities are identified. IMPRESSION: 1. Slightly increased peribronchial thickening from 2014 without other significant abnormality. Electronically Signed   By: Margarette Canada M.D.   On: 04/07/2017 13:01   Ct Head Wo Contrast  Result Date: 04/07/2017 CLINICAL DATA:  Right-sided weakness with facial droop and slurred speech. EXAM: CT HEAD WITHOUT CONTRAST TECHNIQUE: Contiguous axial images were obtained from the base of the skull through the vertex without intravenous contrast. COMPARISON:  None. FINDINGS: Brain: No evidence of acute infarction, hemorrhage, hydrocephalus, extra-axial collection or mass lesion/mass effect. 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 Mild presumed chronic small vessel ischemic type change around the lateral ventricles. Vascular: No hyperdense vessel or unexpected calcification. Skull: Normal. Negative for fracture or focal lesion. Sinuses/Orbits: No acute finding. Other: Call has been placed to Dr. Leonel Ramsay. IMPRESSION: 1. No acute finding. 2. Mild chronic small vessel ischemic change. Electronically Signed   By: Monte Fantasia M.D.   On: 04/07/2017 12:16   Mr Jodene Nam Head Wo Contrast  Result Date: 04/07/2017 CLINICAL DATA:  52 y/o F; slurred speech and right-sided body weakness. EXAM: MRI HEAD WITHOUT CONTRAST MRA HEAD WITHOUT CONTRAST  TECHNIQUE: Sagittal T1 FLAIR, axial DWI, coronal DWI, axial T2 sequences MRI head were acquired. Angiographic images of the head were obtained using MRA technique without contrast. COMPARISON:  04/07/2017 CT head FINDINGS: MRI HEAD FINDINGS Brain: Small foci of reduced diffusion are present within the left posterior limb of internal capsule and right splenium of corpus callosum. No focal mass effect. No hydrocephalus, extra-axial collection, or effacement of basilar cisterns identified. Motion degraded axial T2 and sagittal T1 sequences. Vascular: As below. Skull and upper cervical spine: Normal marrow signal. Sinuses/Orbits: Negative. Other: None. MRA HEAD FINDINGS Internal carotid arteries: Patent. Mild irregularity of carotid siphons compatible with atherosclerosis with mild left cavernous and right paraclinoid stenosis. 2 mm inferiorly directed outpouching of right cavernous ICA, likely prominent infundibulum of diminutive vessel. Anterior cerebral arteries:  Patent. Middle cerebral arteries: Patent. Anterior communicating artery: Patent. Posterior  communicating arteries: Patent right. No left identified, likely hypoplastic or absent. Posterior cerebral arteries: Patent. Left P2 short segment of mild stenosis. Basilar artery:  Patent.  Lower basilar fenestration. Vertebral arteries:  Patent. No evidence of high-grade stenosis, large vessel occlusion, or aneurysm unless noted above. IMPRESSION: 1. Small acute/early subacute infarctions within the left posterior limb of internal capsule and right splenium of corpus callosum. 2. Patent anterior and posterior intracranial circulation. No large vessel occlusion, high-grade stenosis, or aneurysm. 3. Intracranial atherosclerosis with multiple segments of mild stenosis. These results were called by telephone at the time of interpretation on 04/07/2017 at 3:58 pm to PA Law , who verbally acknowledged these results. Electronically Signed   By: Kristine Garbe M.D.    On: 04/07/2017 16:03   Mr Brain Wo Contrast  Result Date: 04/07/2017 CLINICAL DATA:  52 y/o F; slurred speech and right-sided body weakness. EXAM: MRI HEAD WITHOUT CONTRAST MRA HEAD WITHOUT CONTRAST TECHNIQUE: Sagittal T1 FLAIR, axial DWI, coronal DWI, axial T2 sequences MRI head were acquired. Angiographic images of the head were obtained using MRA technique without contrast. COMPARISON:  04/07/2017 CT head FINDINGS: MRI HEAD FINDINGS Brain: Small foci of reduced diffusion are present within the left posterior limb of internal capsule and right splenium of corpus callosum. No focal mass effect. No hydrocephalus, extra-axial collection, or effacement of basilar cisterns identified. Motion degraded axial T2 and sagittal T1 sequences. Vascular: As below. Skull and upper cervical spine: Normal marrow signal. Sinuses/Orbits: Negative. Other: None. MRA HEAD FINDINGS Internal carotid arteries: Patent. Mild irregularity of carotid siphons compatible with atherosclerosis with mild left cavernous and right paraclinoid stenosis. 2 mm inferiorly directed outpouching of right cavernous ICA, likely prominent infundibulum of diminutive vessel. Anterior cerebral arteries:  Patent. Middle cerebral arteries: Patent. Anterior communicating artery: Patent. Posterior communicating arteries: Patent right. No left identified, likely hypoplastic or absent. Posterior cerebral arteries: Patent. Left P2 short segment of mild stenosis. Basilar artery:  Patent.  Lower basilar fenestration. Vertebral arteries:  Patent. No evidence of high-grade stenosis, large vessel occlusion, or aneurysm unless noted above. IMPRESSION: 1. Small acute/early subacute infarctions within the left posterior limb of internal capsule and right splenium of corpus callosum. 2. Patent anterior and posterior intracranial circulation. No large vessel occlusion, high-grade stenosis, or aneurysm. 3. Intracranial atherosclerosis with multiple segments of mild stenosis.  These results were called by telephone at the time of interpretation on 04/07/2017 at 3:58 pm to PA Law , who verbally acknowledged these results. Electronically Signed   By: Kristine Garbe M.D.   On: 04/07/2017 16:03   Dg Swallowing Func-speech Pathology  Result Date: 04/08/2017 Objective Swallowing Evaluation: Type of Study: MBS-Modified Barium Swallow Study  Patient Details Name: XIANA CARNS MRN: [id number] Date of Birth: Oct 05, 1965 Today's Date: 04/08/2017 Time: SLP Start Time (ACUTE ONLY): 1140 -SLP Stop Time (ACUTE ONLY): 1200 SLP Time Calculation (min) (ACUTE ONLY): 20 min Past Medical History: Past Medical History: Diagnosis Date . Anxiety 2002 . Chest tightness  . Depression 2001 . Diabetes type 1, uncontrolled ([place])   at age 13 . Diabetic neuropathy ([place])  . Essential hypertension 2015 . GERD (gastroesophageal reflux disease)   about age of 76 . Nausea and vomiting in adult  . Urinary frequency  . Yeast vaginitis  Past Surgical History: Past Surgical History: Procedure Laterality Date . ACHILLES TENDON SURGERY Left 03/10/2013  Procedure: LEFT CHOPART AMPUTATION/ LEFT TENDON ACHILLES [place];  Surgeon: Wylene Simmer, MD;  Location: [facility];  Service: Orthopedics;  Laterality: Left; .  AMPUTATION Left 06/15/2012  Procedure: AMPUTATION DIGIT;  Surgeon: Meredith Pel, MD;  Location: [facility];  Service: Orthopedics;  Laterality: Left;  Left great toe revision amputation . ENDOMETRIAL ABLATION   . LAPAROSCOPIC CHOLECYSTECTOMY  01/30/2015  [facility] day surgery  HPI: pt is a 52 yo female adm to [facility] with right sided weakness and speech deficits.  MRI 04/07/17 Small acute/early subacute infarctions within the left posterior right limb of internal capsule and right splenium of corpus callosum.Marland Kitchen  Pt passed RNSSS but was coughing with intake of Sprite.  Pt report h/o sensing throat tightening when taking a medication for neuropathy - has since stopped this medication with resolution of symptoms.   Subjective: pt  awake in bed, daughter present Assessment / Plan / Recommendation CHL IP CLINICAL IMPRESSIONS 04/08/2017 Clinical Impression Pt demonstrates slightly delayed/partially incomplete airway closure before/during the swallow leading to trace flash penetration and one instance of sensed aspiration. A chin tuck provided slightly extra time for complete airway closure, even with consecutive straw sips. Pt was able to return demonstrate. Otherwise swallow function WNL. Recommend pt consume a regular diet with thin liquids with a chin tuck, pills whole in puree.  SLP Visit Diagnosis Dysphagia, oropharyngeal phase (R13.12) Attention and concentration deficit following -- Frontal lobe and executive function deficit following -- Impact on safety and function Mild aspiration risk   CHL IP TREATMENT RECOMMENDATION 04/08/2017 Treatment Recommendations Therapy as outlined in treatment plan below   Prognosis 04/08/2017 Prognosis for Safe Diet Advancement Good Barriers to Reach Goals -- Barriers/Prognosis Comment -- CHL IP DIET RECOMMENDATION 04/08/2017 SLP Diet Recommendations Regular solids;Thin liquid Liquid Administration via Straw Medication Administration Whole meds with puree Compensations Slow rate;Small sips/bites;Chin tuck Postural Changes Remain semi-upright after after feeds/meals (Comment);Seated upright at 90 degrees   CHL IP OTHER RECOMMENDATIONS 04/08/2017 Recommended Consults -- Oral Care Recommendations Patient independent with oral care Other Recommendations Order thickener from pharmacy   CHL IP FOLLOW UP RECOMMENDATIONS 04/08/2017 Follow up Recommendations Inpatient Rehab   CHL IP FREQUENCY AND DURATION 04/08/2017 Speech Therapy Frequency (ACUTE ONLY) min 2x/week Treatment Duration 2 weeks      CHL IP ORAL PHASE 04/08/2017 Oral Phase WFL Oral - Pudding Teaspoon -- Oral - Pudding Cup -- Oral - Honey Teaspoon -- Oral - Honey Cup -- Oral - Nectar Teaspoon -- Oral - Nectar Cup -- Oral - Nectar Straw -- Oral - Thin Teaspoon -- Oral -  Thin Cup -- Oral - Thin Straw -- Oral - Puree -- Oral - Mech Soft -- Oral - Regular -- Oral - Multi-Consistency -- Oral - Pill -- Oral Phase - Comment --  CHL IP PHARYNGEAL PHASE 04/08/2017 Pharyngeal Phase Impaired Pharyngeal- Pudding Teaspoon -- Pharyngeal -- Pharyngeal- Pudding Cup -- Pharyngeal -- Pharyngeal- Honey Teaspoon -- Pharyngeal -- Pharyngeal- Honey Cup -- Pharyngeal -- Pharyngeal- Nectar Teaspoon -- Pharyngeal -- Pharyngeal- Nectar Cup -- Pharyngeal -- Pharyngeal- Nectar Straw -- Pharyngeal -- Pharyngeal- Thin Teaspoon -- Pharyngeal -- Pharyngeal- Thin Cup Penetration/Aspiration before swallow;Penetration/Aspiration during swallow;Reduced airway/laryngeal closure;Trace aspiration Pharyngeal Material enters airway, passes BELOW cords and not ejected out despite cough attempt by patient Pharyngeal- Thin Straw Penetration/Aspiration during swallow;Reduced airway/laryngeal closure;Compensatory strategies attempted (with notebox) Pharyngeal Material enters airway, remains ABOVE vocal cords then ejected out;Material does not enter airway Pharyngeal- Puree WFL Pharyngeal -- Pharyngeal- Mechanical Soft -- Pharyngeal -- Pharyngeal- Regular WFL Pharyngeal -- Pharyngeal- Multi-consistency -- Pharyngeal -- Pharyngeal- Pill WFL;Compensatory strategies attempted (with notebox) Pharyngeal -- Pharyngeal Comment --  No flowsheet data found.  No flowsheet data found. Herbie [place], [place] CCC-SLP [phone] [place], [name] 04/08/2017, 2:38 PM              Vas US Carotid (at [place] Only)  Result Date: 04/08/2017 Carotid Arterial Duplex Study Indications:   Speech disturbance. Risk Factors:  Hypertension. Other Factors: Type I Diabetes Mellitus. Examination Guidelines: A complete evaluation includes B-mode imaging, spectral doppler, color doppler, and power doppler as needed of all accessible portions of each vessel. Bilateral testing is considered an integral part of a complete examination. Limited examinations for  reoccurring indications may be performed as noted.  Right Carotid Findings: +----------+-------+-------+--------+---------------------------------+--------+           PSV    EDV    StenosisDescribe                         Comments           cm/s   cm/s                                                     +----------+-------+-------+--------+---------------------------------+--------+ CCA Prox  103    10                                                       +----------+-------+-------+--------+---------------------------------+--------+ CCA Distal-65    -14            irregular, heterogenous and                                               calcific                                  +----------+-------+-------+--------+---------------------------------+--------+ ICA Prox  -104   -19            heterogenous, irregular and                                               calcific                                  +----------+-------+-------+--------+---------------------------------+--------+ ICA Distal-74    -22                                                      +----------+-------+-------+--------+---------------------------------+--------+ ECA       -343   -22            heterogenous, irregular and  calcific                                  +----------+-------+-------+--------+---------------------------------+--------+ +----------+--------+-------+----------------+-------------------+           PSV cm/sEDV cmsDescribe        Arm Pressure (mmHG) +----------+--------+-------+----------------+-------------------+ [id number]            Multiphasic, WNL                    +----------+--------+-------+----------------+-------------------+ +---------+--------+--+--------+--+---------+ VertebralPSV cm/s83EDV cm/s21Antegrade +---------+--------+--+--------+--+---------+  Left Carotid Findings:  +----------+-------+-------+--------+------------------------+-----------------+           PSV    EDV    StenosisDescribe                Comments                    cm/s   cm/s                                                     +----------+-------+-------+--------+------------------------+-----------------+ CCA Prox  67     16                                     intimal                                                                   thickening        +----------+-------+-------+--------+------------------------+-----------------+ CCA Distal-50    -17                                                      +----------+-------+-------+--------+------------------------+-----------------+ ICA Prox  -59    -15                                                      +----------+-------+-------+--------+------------------------+-----------------+ ICA Distal-125   -46                                                      +----------+-------+-------+--------+------------------------+-----------------+ ECA       -15                   irregular and  heterogenous                              +----------+-------+-------+--------+------------------------+-----------------+ +----------+--------+--------+----------------+-------------------+ SubclavianPSV cm/sEDV cm/sDescribe        Arm Pressure (mmHG) +----------+--------+--------+----------------+-------------------+           227             Multiphasic, WNL                    +----------+--------+--------+----------------+-------------------+ +---------+--------+--+--------+--+---------+ VertebralPSV cm/s76EDV cm/s19Antegrade +---------+--------+--+--------+--+---------+  Final Interpretation: Right Carotid: Velocities in the right ICA are consistent with a 1-39% stenosis. Left Carotid: Velocities in the left ICA are consistent with a 1-39%  stenosis.  *See table(s) above for measurements and observations.  Electronically signed by Ruta Hinds on 04/08/2017 at 4:10:38 PM.   Microbiology: No results found for this or any previous visit (from the past 240 hour(s)).   Labs: Basic Metabolic Panel: Recent Labs  Lab 04/08/17 0154 04/09/17 0612 04/10/17 0817  NA 138 138 138  K 3.8 3.9 4.0  CL 101 103 106  CO2 22 23 21*  GLUCOSE 295* 182* 176*  BUN 25* 31* 33*  CREATININE 2.63* 3.29* 2.87*  CALCIUM 8.7* 8.7* 8.4*   Liver Function Tests: No results for input(s): AST, ALT, ALKPHOS, BILITOT, PROT, ALBUMIN in the last 168 hours. No results for input(s): LIPASE, AMYLASE in the last 168 hours. No results for input(s): AMMONIA in the last 168 hours. CBC: Recent Labs  Lab 04/08/17 0154  WBC 8.4  HGB 11.9*  HCT 37.3  MCV 86.3  PLT 233   Cardiac Enzymes: No results for input(s): CKTOTAL, CKMB, CKMBINDEX, TROPONINI in the last 168 hours. BNP: BNP (last 3 results) No results for input(s): BNP in the last 8760 hours.  ProBNP (last 3 results) No results for input(s): PROBNP in the last 8760 hours.  CBG: Recent Labs  Lab 04/09/17 0617 04/09/17 1117 04/09/17 1628 04/09/17 2139 04/10/17 0621  GLUCAP 156* 217* 255* 165* 149*       Signed:  Domenic Polite MD.  [facility] 04/14/2017, 3:17 PM

## 2017-04-24 ENCOUNTER — Ambulatory Visit: Payer: Medicaid Other | Admitting: Internal Medicine

## 2017-04-27 ENCOUNTER — Ambulatory Visit: Payer: Medicaid Other | Admitting: Internal Medicine

## 2017-05-08 ENCOUNTER — Ambulatory Visit: Payer: Medicaid Other | Admitting: Internal Medicine

## 2017-05-13 ENCOUNTER — Ambulatory Visit: Payer: Medicaid Other | Attending: Internal Medicine | Admitting: Physical Therapy

## 2017-05-13 ENCOUNTER — Encounter: Payer: Self-pay | Admitting: Occupational Therapy

## 2017-05-13 ENCOUNTER — Other Ambulatory Visit: Payer: Self-pay

## 2017-05-13 ENCOUNTER — Ambulatory Visit: Payer: Medicaid Other | Admitting: Occupational Therapy

## 2017-05-13 DIAGNOSIS — R278 Other lack of coordination: Secondary | ICD-10-CM | POA: Diagnosis present

## 2017-05-13 DIAGNOSIS — R208 Other disturbances of skin sensation: Secondary | ICD-10-CM | POA: Insufficient documentation

## 2017-05-13 DIAGNOSIS — M6281 Muscle weakness (generalized): Secondary | ICD-10-CM

## 2017-05-13 DIAGNOSIS — R2689 Other abnormalities of gait and mobility: Secondary | ICD-10-CM

## 2017-05-13 DIAGNOSIS — R2681 Unsteadiness on feet: Secondary | ICD-10-CM | POA: Diagnosis present

## 2017-05-13 DIAGNOSIS — I69351 Hemiplegia and hemiparesis following cerebral infarction affecting right dominant side: Secondary | ICD-10-CM | POA: Insufficient documentation

## 2017-05-13 NOTE — Therapy (Signed)
[facility] [address] [place], [place], [address] Phone: [phone]   Fax:  [phone]  Occupational Therapy Evaluation  Patient Details  Name: [name] MRN: [id number] Date of Birth: 24-Oct-1965 Referring Provider: Dr. Leonie Man   Encounter Date: 05/13/2017  OT End of Session - 05/13/17 1606    Visit Number  1    Number of Visits  12    Authorization Type  Medicaid - OT will ask for initial 3 visits    Authorization - Visit Number  1    Authorization - Number of Visits  3    OT Start Time  7121    OT Stop Time  1530    OT Time Calculation (min)  45 min    Behavior During Therapy  [facility] for tasks assessed/performed       Past Medical History:  Diagnosis Date  . Anxiety 2002  . Chest tightness   . Depression 2001  . Diabetes type 1, uncontrolled ([place])    at age 52  . Diabetic neuropathy ([place])   . Essential hypertension 2015  . GERD (gastroesophageal reflux disease)    about age of 6  . Nausea and vomiting in adult   . Urinary frequency   . Yeast vaginitis     Past Surgical History:  Procedure Laterality Date  . ACHILLES TENDON SURGERY Left 03/10/2013   Procedure: LEFT CHOPART AMPUTATION/ LEFT TENDON ACHILLES [place];  Surgeon: Wylene Simmer, MD;  Location: [facility];  Service: Orthopedics;  Laterality: Left;  . AMPUTATION Left 06/15/2012   Procedure: AMPUTATION DIGIT;  Surgeon: Meredith Pel, MD;  Location: [facility];  Service: Orthopedics;  Laterality: Left;  Left great toe revision amputation  . ENDOMETRIAL ABLATION    . LAPAROSCOPIC CHOLECYSTECTOMY  01/30/2015   [facility]     There were no vitals filed for this visit.  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  No/denies    Multiple Pain Sites  No        [facility] OT  Assessment - 05/13/17 0001      Assessment   Medical Diagnosis  Stroke    Referring Provider  Domenic Polite    Onset Date/Surgical Date  04/07/17    Hand Dominance  Right    Prior Therapy  Acute hospital      Precautions   Precautions  Fall      Restrictions   Other Position/Activity Restrictions  Has difficulty standing more than 15 min since partial foot amputation      Balance Screen   Has the patient fallen in the past 6 months  No    Has the patient had a decrease in activity level because of a fear of falling?   No    Is the patient reluctant to leave their home because of a fear of falling?   No      Home  Environment   Family/patient expects to be discharged to:  Private residence    Living Arrangements  Children    Type of [place]  One level    Bathroom Shower/Tub  Fletcher seat;Grab bars - tub/shower;Toilet riser;Walker -  2 wheels;Cane - single point;Wheelchair - manual    Additional Comments  Has shower seat with back    Lives With  Daughter      Prior Function   Level of Independence  Independent with basic ADLs;Independent with community mobility without device;Independent with homemaking with ambulation    Vocation  Part time employment    Vocation Requirements  medical examiners/phlebotomist    Leisure  darts, shoot pool, time with friends, dance      ADL   Eating/Feeding  Minimal assistance    Grooming  Applying makeup    Upper Body Bathing  Independent    Lower Body Bathing  Modified independent    Upper Body Dressing  Independent    Lower Body Dressing  Modified independent    Toilet Transfer  Modified independent    Toileting - Clothing Manipulation  Modified independent    Toileting -  Engineer, drilling seat with back      IADL   Prior Level of Function Shopping  independent     Shopping  Shops independently for small purchases    Prior Level of Function Light Housekeeping  independent    Light Housekeeping  Performs light daily tasks such as dishwashing, bed making    Prior Level of Function Meal Prep  independent    Meal Prep  Able to complete simple cold meal and snack prep    Prior Level of Function Metallurgist own vehicle    Prior Level of Function Medication Managment  Independent    Medication Management  Is responsible for taking medication in correct dosages at correct time    Prior Level of Function Loss adjuster, chartered financial matters independently (budgets, writes checks, pays rent, bills goes to bank), collects and keeps track of income      Written Expression   Dominant Hand  Right    Handwriting  90% legible      Vision - History   Baseline Vision  Wears glasses only for reading    Patient Visual Report  Eye fatigue/eye pain/headache occasional headaches    Additional Comments  Nausea- related to GI issues      Vision Assessment   Eye Alignment  Within Functional Limits      Activity Tolerance   Activity Tolerance  -- Endurance limits daily activity      Posture/Postural Control   Posture/Postural Control  Postural limitations    Postural Limitations  Rounded Shoulders;Flexed trunk    Posture Comments  Decreased weight on right leg, partial amp left foot      Sensation   Light Touch  Impaired by gross assessment    Stereognosis  Appears Intact    Hot/Cold  Appears Intact    Proprioception  Appears Intact      Coordination   9 Hole Peg Test  Right;Left    Right 9 Hole Peg Test  48.46    Left 9 Hole Peg Test  23.25      Perception   Perception  Within Functional Limits      Praxis   Praxis  Intact      ROM / Strength   AROM / PROM / Strength  AROM;Strength      AROM   Overall AROM   Deficits    Overall  AROM Comments  overuse  of shoulder elevation for attempts at flexion.        Strength   Overall Strength  Deficits    Overall Strength Comments  RUE 4/5, LUE 5/5      Hand Function   Right Hand Gross Grasp  Impaired    Right Hand Grip (lbs)  50    Right Hand Lateral Pinch  10 lbs    Left Hand Gross Grasp  Functional    Left Hand Grip (lbs)  70    Left Hand Lateral Pinch  16 lbs                      OT Education - 05/13/17 1542    Education provided  Yes    Education Details  Reviewed evaluation results and potential goals to help patient return to work and Scientist, research (life sciences) independence with IADL    Person(s) Educated  Patient;Child(ren)    Methods  Explanation    Comprehension  Verbalized understanding       OT Short Term Goals - 05/13/17 1624      OT SHORT TERM GOAL #1   Title  Patient will complete a home exercise program designed to improve fine motor coordiantion in right hand with min cueing (due 4/12)    Baseline  Patient is not currently completing fine motor exercises    Time  4    Period  Weeks    Status  New    Target Date  06/12/17      OT SHORT TERM GOAL #2   Title  Patient will complete a home exercise program designed to improve mid level reach     Baseline  Patient currently tips laterally with attempts to raise right arm to chest height    Time  4    Period  Weeks    Status  New      OT SHORT TERM GOAL #3   Title  Patient will utilize right shoulder flexion with elbow extension to reach and obtain 2 lb weight from chest height shelf while standing.    Baseline  Patient tips laterally to raise right arm due to proximal weakness causing a loss of balance in standing    Time  4    Period  Weeks    Status  New        OT Long Term Goals - 05/13/17 1631      OT LONG TERM GOAL #1   Title  Patient will complete the 9 hole peg test in 38 seconds or less using right dominant hand to improve her ability to manipulate small items (due 5/26)    Baseline  48.46- 05/13/17     Time  10    Period  Weeks    Status  New    Target Date  07/26/17      OT LONG TERM GOAL #2   Title  Patient will demonstrate effective use of bilateral upper extremities to lift a full casserole out of the oven without assistance    Baseline  Currently unable    Time  10    Period  Weeks    Status  New      OT LONG TERM GOAL #3   Title  Patient will demonstrate effective use of bilateral upper extremities to pour off a pot of boiling water without assistance    Baseline  Currently unable    Time  10    Period  Weeks    Status  New      OT LONG TERM GOAL #4   Title  Patient will self report greater ease and reduced speed in applying her own makeup daily    Baseline  Self report:  effort - 7/10     Time  10    Period  Weeks    Status  New      OT LONG TERM GOAL #5   Title  Patient will demonstrate adequate strength, and coordination in dominant right hand to simulate med tech duties- taking blood pressure, height and weight, recording data in writing, blood draws    Time  10    Period  Weeks    Status  New            Plan - 05/13/17 1607    Clinical Impression Statement  Patient is a 52 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  Patient's past medical history is significant for Hypertension, Diabetes Mellitus, Diabetic neuropathy, partial amputation of left foot, IBS, and chronc kidney disease.  Patient present sduring OT eval with right hemiparesis, decreased coordination both fine and gross motor, decreased baalnce, decreased activity toelrance, and diminished sensation in right hand.  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, diabetes, neuropathy, CKD, back pain, decreased stand tolerance    Occupational performance deficits (Please refer to evaluation for details):  ADL's;IADL's;Work;Leisure;Social Participation    Rehab Potential  Excellent    OT Frequency  2x / week    OT Duration  8 weeks 1x/week for 3 weeks then 2x/week for 6 weeks    OT Treatment/Interventions  Self-care/ADL training;Moist Heat;Fluidtherapy;DME and/or AE instruction;Splinting;Balance training;Aquatic Therapy;Therapeutic activities;Therapeutic exercise;Neuromuscular education;Functional Mobility Training;Patient/family education;Manual Therapy;Paraffin    Plan  Neuromuscular reeducation right UE mid to high reach without overuse of elevation, and trunk posture, initiate coordination HEP    Clinical Decision Making  Several treatment options, min-mod task modification necessary    Consulted and Agree with Plan of Care  Patient;Family member/caregiver    Family Member Consulted  Daughter Kaitlin       Patient will benefit from skilled therapeutic intervention in order to improve the following deficits and impairments:  Decreased coordination, Decreased range of motion, Difficulty walking, Improper body mechanics, Impaired sensation, Decreased activity tolerance, Decreased knowledge of precautions, Impaired tone, Impaired UE functional use, Decreased knowledge of use of DME, Decreased balance, Decreased strength, Impaired perceived functional ability  Visit Diagnosis: Hemiplegia and hemiparesis following cerebral infarction affecting right dominant side ([place]) - Plan: Ot plan of care cert/re-cert  Other lack of coordination - Plan: Ot plan of care cert/re-cert  Other disturbances  of skin sensation - Plan: Ot plan of care cert/re-cert  Muscle weakness (generalized) - Plan: Ot plan of care cert/re-cert  Unsteadiness on feet - Plan: Ot plan of care cert/re-cert    Problem List Patient Active Problem List   Diagnosis Date  Noted  . Cerebral thrombosis with cerebral infarction 04/08/2017  . Acute right-sided weakness 04/07/2017  . Slurred speech 04/07/2017  . Right sided weakness 04/07/2017  . Hyperhidrosis 09/01/2016  . Migraine with aura and without status migrainosus, not intractable 07/28/2016  . 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[place]) 08/04/2014  . CKD stage 3 due to type 2 diabetes mellitus ([place]) 06/27/2014  . Vitamin D insufficiency 05/08/2014  . Obesity (BMI 30.0-34.9) 05/08/2014  . Unilateral amputation of left foot ([place]) 05/08/2014  . Bursitis of left shoulder 02/14/2014  . Neck pain 01/03/2014  . Diabetes mellitus type 2, uncontrolled, with complications ([place]) 08/01/5613  . 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.23) 01/14/2013  . Insomnia 08/04/2012  . Diabetic neuropathy, painful ([place]) 08/04/2011  . Anxiety and depression 05/16/2010  . Essential hypertension, benign 11/01/2007  . Female stress incontinence 11/01/2007  . TOBACCO DEPENDENCE 04/30/2006  . GASTROESOPHAGEAL REFLUX, NO ESOPHAGITIS 04/30/2006  . Irritable bowel syndrome 04/30/2006    Mariah Milling, OTR/L 05/13/2017, 4:49 PM  [facility] [address] [place], [place], [address] Phone: [phone]   Fax:  [phone]  Name: LISE PINCUS MRN: [id number] Date of Birth: [birth year]

## 2017-05-14 NOTE — Therapy (Signed)
[facility] [address] [place], [place], [address] Phone: [phone]   Fax:  [phone]  Physical Therapy Evaluation  Patient Details  Name: [name] MRN: [id number] Date of Birth: 11-18-1965 Referring Provider: Dr. Leonie Man   Encounter Date: 05/13/2017  PT End of Session - 05/14/17 1136    Visit Number  1    Number of Visits  13 eval + 12 visits    Date for PT Re-Evaluation  07/12/17    Authorization Type  Medicaid    Authorization - Visit Number  0    Authorization - Number of Visits  12    PT Start Time  1532    PT Stop Time  1620    PT Time Calculation (min)  48 min    Activity Tolerance  Patient tolerated treatment well    Behavior During Therapy  [facility] for tasks assessed/performed       Past Medical History:  Diagnosis Date  . Anxiety 2002  . Chest tightness   . Depression 2001  . Diabetes type 1, uncontrolled ([place])    at age 54  . Diabetic neuropathy ([place])   . Essential hypertension 2015  . GERD (gastroesophageal reflux disease)    about age of 57  . Nausea and vomiting in adult   . Urinary frequency   . Yeast vaginitis     Past Surgical History:  Procedure Laterality Date  . ACHILLES TENDON SURGERY Left 03/10/2013   Procedure: LEFT CHOPART AMPUTATION/ LEFT TENDON ACHILLES [place];  Surgeon: Wylene Simmer, MD;  Location: [facility];  Service: Orthopedics;  Laterality: Left;  . AMPUTATION Left 06/15/2012   Procedure: AMPUTATION DIGIT;  Surgeon: Meredith Pel, MD;  Location: [facility];  Service: Orthopedics;  Laterality: Left;  Left great toe revision amputation  . ENDOMETRIAL ABLATION    . LAPAROSCOPIC CHOLECYSTECTOMY  01/30/2015   [facility] day [facility]     There were no vitals filed for this visit.   Subjective Assessment - 05/13/17 1535    Subjective  Pt presents to OPPT for evaluation s/p CVA with resultant R sided weakness.  Pt having greatest difficulty using her RUE.  Has had one fall at  home while standing on RLE to lift LLE into the bed.  Reports decreased endurance.  Denies changes in sensation in the RLE, changes in vision or dizziness.  Started driving this week short distances.    Patient is accompained by:  Family member    Pertinent History   anxiety, depression, type 1 DM, diabetic neuropathy, HTN, CKD, tobacco use and L great toe amputation.     Patient Stated Goals  To get back to normal and return to work    Currently in Pain?  No/denies         [facility] PT Assessment - 05/13/17 1539      Assessment   Medical Diagnosis  CVA, R hemiparesis    Referring Provider  Dr. Leonie Man    Onset Date/Surgical Date  04/07/17    Hand Dominance  Right    Prior Therapy  Acute hospital      Precautions   Precautions  Fall;Other (comment)    Precaution Comments   anxiety, depression, type 1 DM, diabetic neuropathy, HTN, CKD, tobacco use and L great toe amputation.     Required Braces or Orthoses  Other Brace/Splint    Other Brace/Splint  wears CAM boot on LLE in community due to amputation      Restrictions  Weight Bearing Restrictions  No      Balance Screen   Has the patient fallen in the past 6 months  Yes    How many times?  1    Has the patient had a decrease in activity level because of a fear of falling?   Yes    Is the patient reluctant to leave their home because of a fear of falling?   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    Leisure  darts, shoot pool, time with friends,  dance      Observation/Other Assessments   Focus on Therapeutic Outcomes (FOTO)   Not captured      Sensation   Light Touch  Appears Intact      ROM / Strength   AROM / PROM / Strength  Strength      Strength   Overall Strength  Deficits    Overall Strength Comments  LLE: 5/5 (ankle not tested - in CAM boot); RLE: 4/5 but not a consistent contraction      Ambulation/Gait   Ambulation/Gait  Yes    Ambulation/Gait Assistance  6: Modified independent (Device/Increase time)    Ambulation Distance (Feet)  300 Feet    Assistive device  None    Gait Pattern  Step-through pattern;Right foot flat foot slap on R    Ambulation Surface  Level;Indoor    Stairs  Yes    Stairs Assistance  5: Supervision    Stair Management Technique  Two rails;Forwards;Alternating pattern decreased stability in RLE when descending with LLE    Number of Stairs  4    Height of Stairs  6      Standardized Balance Assessment   Standardized Balance Assessment  Berg Balance Test;10 meter walk test    10 Meter Walk  8.47 seconds or 3.8 ft/sec      Berg Balance Test   Sit to Stand  Able to stand  independently using hands    Standing Unsupported  Able to stand safely 2 minutes    Sitting with Back Unsupported but Feet Supported on Floor or Stool  Able to sit safely and securely 2 minutes    Stand to Sit  Controls descent by using hands    Transfers  Able to transfer safely, minor use of hands    Standing Unsupported with Eyes Closed  Able to stand 10 seconds with supervision    Standing Ubsupported with Feet Together  Able to place feet together independently and stand for 1 minute with supervision    From Standing, Reach Forward with Outstretched Arm  Can reach confidently >25 cm (10")    From Standing Position, Pick up Object from Floor  Able to pick up shoe safely and easily    From Standing Position, Turn to Look Behind Over each Shoulder  Looks behind from both sides and weight shifts well    Turn 360  Degrees  Able to turn 360 degrees safely in 4 seconds or less    Standing Unsupported, Alternately Place Feet  on Step/Stool  Needs assistance to keep from falling or unable to try    Standing Unsupported, One Foot in [place] to take small step independently and hold 30 seconds    Standing on One Leg  Unable to try or needs assist to prevent fall    Total Score  42    Berg comment:  42/56      Functional Gait  Assessment   Gait assessed   Yes    Gait Level Surface  Walks 20 ft in less than 7 sec but greater than 5.5 sec, uses assistive device, slower speed, mild gait deviations, or deviates 6-10 in outside of the 12 in walkway width.    Change in Gait Speed  Able to change speed, demonstrates mild gait deviations, deviates 6-10 in outside of the 12 in walkway width, or no gait deviations, unable to achieve a major change in velocity, or uses a change in velocity, or uses an assistive device.    Gait with Horizontal Head Turns  Performs head turns smoothly with no change in gait. Deviates no more than 6 in outside 12 in walkway width    Gait with Vertical Head Turns  Performs head turns with no change in gait. Deviates no more than 6 in outside 12 in walkway width.    Gait and Pivot Turn  Pivot turns safely within 3 sec and stops quickly with no loss of balance.    Step Over Obstacle  Is able to step over one shoe box (4.5 in total height) without changing gait speed. No evidence of imbalance.    Gait with Narrow Base of Support  Ambulates less than 4 steps heel to toe or cannot perform without assistance.    Gait with Eyes Closed  Walks 20 ft, slow speed, abnormal gait pattern, evidence for imbalance, deviates 10-15 in outside 12 in walkway width. Requires more than 9 sec to ambulate 20 ft.    Ambulating Backwards  Walks 20 ft, uses assistive device, slower speed, mild gait deviations, deviates 6-10 in outside 12 in walkway width.    Steps  Alternating feet, must use rail.    Total Score  20     FGA comment:  20/30     Patient demonstrates increased fall risk as noted by score of 42/56 on Berg Balance Scale.  (<36= high risk for falls, close to 100%; 37-45 significant >80%; 46-51 moderate >50%; 52-55 lower >25%)         Objective measurements completed on examination: See above findings.              PT Education - 05/14/17 1136    Education provided  Yes    Education Details  clinical findings, PT POC and goals    Person(s) Educated  Patient;Child(ren)    Methods  Explanation    Comprehension  Verbalized understanding       PT Short Term Goals - 05/14/17 1142      PT SHORT TERM GOAL #1   Title  Pt will participate in formal assessment of endurance with 6 min walk test    Baseline  Not assessed to date    Time  4    Period  Weeks    Status  New    Target Date  06/12/17      PT SHORT TERM GOAL #2   Title  Pt will improve gait velocity to >/= 4.0 ft/sec.    Baseline  3.8 ft/sec  Time  4    Period  Weeks    Status  New    Target Date  06/12/17      PT SHORT TERM GOAL #3   Title  Pt will improve BERG balance score to >/= 46/56    Baseline  42/56    Time  4    Period  Weeks    Status  New    Target Date  06/12/17      PT SHORT TERM GOAL #4   Title  Will improve FGA score by 4 points    Baseline  20/30    Time  4    Period  Weeks    Status  New    Target Date  06/12/17        PT Long Term Goals - 05/14/17 1145      PT LONG TERM GOAL #1   Title  Pt will demonstrate independence with HEP and walking program    Baseline  dependent for HEP/walking program    Time  8    Period  Weeks    Status  New    Target Date  07/12/17      PT LONG TERM GOAL #2   Title  Pt will improve distance on 6 min walk test by 150'    Baseline  not assessed to date    Time  8    Period  Weeks    Target Date  07/12/17      PT LONG TERM GOAL #3   Title  Pt will improve BERG balance score to >/= 52/56    Baseline  42/56 at eval    Time  8    Period   Weeks    Status  New    Target Date  07/12/17      PT LONG TERM GOAL #4   Title  Pt will improve FGA by 8 points from initial assessment    Baseline  20/30    Time  8    Period  Weeks    Status  New    Target Date  07/12/17      PT LONG TERM GOAL #5   Title  Pt will ambulate x 1000' over variety of outdoor surfaces MOD I and will negotiate 12 stairs, alternating sequence MOD I    Baseline  300' indoor surfaces MOD I; 4 stairs with one rail alternating sequence with supervision    Time  8    Period  Weeks    Status  New    Target Date  07/12/17             Plan - 05/14/17 1137    Clinical Impression Statement  Pt is a 52 year old female referred to Neuro OPPT for evaluation of R sided weakness after CVA of L posterior limb of internal capsule on 04/07/2017. Pt's PMH is significant for the following: anxiety, depression, type 1 DM, diabetic neuropathy, HTN, CKD, tobacco use and L great toe amputation. The following deficits were noted during pt's exam: RLE weakness, impaired endurance, impaired balance and gait. Pt's gait speed is below functional limits for independent community dwelling adult.  Pt's BERG and FGA score indicates pt is at significant risk for falls. Pt would benefit from skilled PT to address these impairments and functional limitations to maximize functional mobility independence and reduce falls risk.    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; phlebotomist; anxiety, depression, type 1 DM, diabetic neuropathy, HTN, CKD, tobacco use and L great toe amputation    Clinical  Decision Making  Low    Rehab Potential  Good    PT Frequency  2x / week    PT Duration  6 weeks    PT Treatment/Interventions  ADLs/Self Care Home Management;Electrical Stimulation;Gait training;Stair training;Functional mobility training;Therapeutic activities;Therapeutic exercise;Balance training;Neuromuscular re-education;Patient/family education;Orthotic Fit/Training    PT Next Visit Plan  assess 6 min walk and set LTG; discuss use of BIONESS for RLE ankle DF; initiate HEP    Consulted and Agree with Plan of Care  Patient;Family member/caregiver    Family Member Consulted  daughter       Patient will benefit from skilled therapeutic intervention in order to improve the following deficits and impairments:  Abnormal gait, Decreased activity tolerance, Decreased balance, Decreased endurance, Decreased strength, Difficulty walking, Impaired UE functional use  Visit Diagnosis: Hemiplegia and hemiparesis following cerebral infarction affecting right dominant side (HCC)  Muscle weakness (generalized)  Unsteadiness on feet  Other abnormalities of gait and mobility     Problem List Patient Active Problem List   Diagnosis Date Noted  . Cerebral thrombosis with cerebral infarction 04/08/2017  . Acute right-sided weakness 04/07/2017  . Slurred speech 04/07/2017  . Right sided weakness 04/07/2017  . Hyperhidrosis 09/01/2016  . Migraine with aura and without status migrainosus, not intractable 07/28/2016  . 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 CKD stage 3 due to type 2 diabetes mellitus ([place]) 06/27/2014  . Vitamin D insufficiency 05/08/2014  . Obesity (BMI 30.0-34.9) 05/08/2014  . Unilateral amputation of left foot ([place]) 05/08/2014  . Bursitis of left shoulder 02/14/2014  . Neck pain 01/03/2014  . Diabetes mellitus type 2, uncontrolled, with complications ([place]) 51/76/1607  . 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.23)  01/14/2013  . Insomnia 08/04/2012  . Diabetic neuropathy, painful ([place]) 08/04/2011  . Anxiety and depression 05/16/2010  . Essential hypertension, benign 11/01/2007  . Female stress incontinence 11/01/2007  . TOBACCO DEPENDENCE 04/30/2006  . GASTROESOPHAGEAL REFLUX, NO ESOPHAGITIS 04/30/2006  . Irritable bowel syndrome 04/30/2006    Rico Junker, PT, DPT 05/14/17    11:50 AM    [facility] [address] [place], [place], [address] Phone: [phone]   Fax:  [phone]  Name: HEIDIE KRALL MRN: [id number] Date of Birth: [birth year]

## 2017-05-15 ENCOUNTER — Telehealth: Payer: Self-pay | Admitting: Physical Therapy

## 2017-05-15 NOTE — Telephone Encounter (Signed)
PT contacted pt today to discuss patient's report that following PT evaluation on 05/13/2017 she experienced significant R knee pain and patient's request to cancel PT visits.  Pt reported to support staff that her balance has been bad since her amputation and she doesn't feel that PT will change that.  Pt did request to keep OT visits.  Attempted to contact pt to discuss but there was no answer.  LVM for pt to return call to discuss further with PT.  Rico Junker, PT, DPT 05/15/17    4:03 PM

## 2017-05-21 ENCOUNTER — Encounter: Payer: Self-pay | Admitting: Physical Therapy

## 2017-05-21 NOTE — Therapy (Signed)
[facility] [address] [place], [place], [address] Phone: [phone]   Fax:  [phone]  Patient Details  Name: [name] MRN: [id number] Date of Birth: Apr 25, 1965 Referring Provider:  No ref. provider found  Encounter Date: 05/21/2017 PHYSICAL THERAPY DISCHARGE SUMMARY  Visits from Start of Care: 1 - eval only - pt requested not to continue with PT  Current functional level related to goals / functional outcomes: See evaluation for current functional level: pt requested to cancel PT treatment visits due to knee pain after evaluation; pt also believes that her balance has been affected since her amputation and that PT would not make a difference for her balance.  She would like to focus mainly on OT and her affected UE.   Remaining deficits: Impaired strength, endurance, balance   Education / Equipment: None  Plan: Patient agrees to discharge.  Patient goals were not met. Patient is being discharged due to the patient's request.  ?????     Rico Junker, PT, DPT 05/21/17    1:42 PM    [facility] [address] [place], [place], [address] Phone: [phone]   Fax:  [phone]

## 2017-05-27 ENCOUNTER — Ambulatory Visit: Payer: Medicaid Other | Admitting: Occupational Therapy

## 2017-05-27 ENCOUNTER — Telehealth: Payer: Self-pay | Admitting: Internal Medicine

## 2017-05-27 NOTE — Telephone Encounter (Signed)
Pt. Called stating that she had requested medical records 2 weeks ago. Pt. Stated that she has a new primary care because on her last OV with Dr. Wynetta Emery she did not introduce herself to the pt. Pt. Stated that the first thing that came out of the providers mouth was that she was not going to prescribed her Xanax. Pt. Also stated that her BP was high and that the provider did not address her BP. Pt. Stated that because of her high BP she had a stroke. Pt. Medical records were faxed to the new PCP facility.

## 2017-06-03 ENCOUNTER — Encounter: Payer: Self-pay | Admitting: Occupational Therapy

## 2017-06-03 ENCOUNTER — Ambulatory Visit: Payer: Medicaid Other | Attending: Internal Medicine | Admitting: Occupational Therapy

## 2017-06-03 DIAGNOSIS — M6281 Muscle weakness (generalized): Secondary | ICD-10-CM

## 2017-06-03 DIAGNOSIS — R208 Other disturbances of skin sensation: Secondary | ICD-10-CM

## 2017-06-03 DIAGNOSIS — R2681 Unsteadiness on feet: Secondary | ICD-10-CM | POA: Diagnosis present

## 2017-06-03 DIAGNOSIS — R278 Other lack of coordination: Secondary | ICD-10-CM | POA: Insufficient documentation

## 2017-06-03 DIAGNOSIS — I69351 Hemiplegia and hemiparesis following cerebral infarction affecting right dominant side: Secondary | ICD-10-CM | POA: Diagnosis present

## 2017-06-03 NOTE — Patient Instructions (Signed)
  Coordination Activities  Perform the following activities for 10 minutes 2-3 times per day with right hand(s).   Rotate ball in fingertips (clockwise and counter-clockwise).  Toss ball between hands.  Flip cards 1 at a time as fast as you can.  Deal cards with your thumb (Hold deck in hand and push card off top with thumb).  Rotate card in hand (clockwise and counter-clockwise).  Shuffle cards.  Pick up coins and place in container or coin bank.  Pick up coins and stack.  Pick up coins one at a time until you get 5-10 in your hand, then move coins from palm to fingertips to stack one at a time.  Twirl pen between fingers.  Practice writing and/or typing.  Screw together nuts and bolts, then unfasten.

## 2017-06-03 NOTE — Therapy (Signed)
[facility] [address] [place], [place], [address] Phone: [phone]   Fax:  [phone]  Occupational Therapy Treatment  Patient Details  Name: [name] MRN: [id number] Date of Birth: 07-02-1965 Referring Provider: Dr. Leonie Man   Encounter Date: 06/03/2017  OT End of Session - 06/03/17 1418    Visit Number  2    Number of Visits  12    Authorization Type  Medicaid - OT will ask for initial 3 visits    Authorization - Visit Number  1    Authorization - Number of Visits  3    OT Start Time  1230    OT Stop Time  1314    OT Time Calculation (min)  44 min    Activity Tolerance  Patient tolerated treatment well    Behavior During Therapy  [facility] for tasks assessed/performed       Past Medical History:  Diagnosis Date  . Anxiety 2002  . Chest tightness   . Depression 2001  . Diabetes type 1, uncontrolled ([place])    at age 13  . Diabetic neuropathy ([place])   . Essential hypertension 2015  . GERD (gastroesophageal reflux disease)    about age of 30  . Nausea and vomiting in adult   . Urinary frequency   . Yeast vaginitis     Past Surgical History:  Procedure Laterality Date  . ACHILLES TENDON SURGERY Left 03/10/2013   Procedure: LEFT CHOPART AMPUTATION/ LEFT TENDON ACHILLES [place];  Surgeon: Wylene Simmer, MD;  Location: [facility];  Service: Orthopedics;  Laterality: Left;  . AMPUTATION Left 06/15/2012   Procedure: AMPUTATION DIGIT;  Surgeon: Meredith Pel, MD;  Location: [facility];  Service: Orthopedics;  Laterality: Left;  Left great toe revision amputation  . ENDOMETRIAL ABLATION    . LAPAROSCOPIC CHOLECYSTECTOMY  01/30/2015   [facility]     There were no vitals filed for this visit.  Subjective Assessment - 06/03/17 1235    Subjective   I think actually my handwriting is better some days    Patient is accompained by:  Family member    Currently in Pain?  No/denies    Multiple Pain Sites  No                    OT Treatments/Exercises (OP) - 06/03/17 0001      ADLs   ADL Comments  Reviewed short and long term goals with patient.  Patient in agreement.  Patient declined Physical Therapy as her knee hurt after evaluation.  Patient walks in an orthotic boot since partial foot amputation.  She has limited ability to stand for more than ~ 15 minutes, and has balance problems in this boot.  Would recommend that she pursue PT and prosthetist to be fitted for and trained in prosthetic that may give her more mobility options, improved balance and stamina.        Hand Exercises   Other Hand Exercises  Educated patient on several coordination exercises she could do as home program.  Patient needed frequent cueing to reduce shoulder elevation and abduction during attempts at forward reach.  Patient frequently dropping items form ulnar side of hand.  best response with cueing to slow down and focus on coordination between digits, as well as coordination between hand and forearm.               OT Education - 06/03/17 1417    Education provided  Yes    Education Details  OT Plan of care, short and long term goals- patient in agreement.  Home program - coordination exercises for right hand    Person(s) Educated  Patient;Child(ren)    Methods  Explanation;Demonstration;Handout    Comprehension  Verbalized understanding;Returned demonstration;Verbal cues required       OT Short Term Goals - 06/03/17 1424      OT SHORT TERM GOAL #1   Title  Patient will complete a home exercise program designed to improve fine motor coordiantion in right hand with min cueing (due 4/16)    Baseline  Patient is not currently completing fine motor exercises    Status  On-going    Target Date  06/16/17      OT SHORT TERM GOAL #2   Title  Patient will complete a home exercise program designed to improve mid level reach     Baseline  Patient currently tips laterally with attempts to raise right arm  to chest height    Status  On-going      OT SHORT TERM GOAL #3   Title  Patient will utilize right shoulder flexion with elbow extension to reach and obtain 2 lb weight from chest height shelf while standing.    Baseline  Patient tips laterally to raise right arm due to proximal weakness causing a loss of balance in standing    Status  On-going        OT Long Term Goals - 06/03/17 1425      OT LONG TERM GOAL #1   Title  Patient will complete the 9 hole peg test in 38 seconds or less using right dominant hand to improve her ability to manipulate small items (due 5/26)    Baseline  48.46- 05/13/17    Status  On-going      OT LONG TERM GOAL #2   Title  Patient will demonstrate effective use of bilateral upper extremities to lift a full casserole out of the oven without assistance    Baseline  Currently unable    Status  On-going      OT LONG TERM GOAL #3   Title  Patient will demonstrate effective use of bilateral upper extremities to pour off a pot of boiling water without assistance    Baseline  Currently unable    Status  On-going      OT LONG TERM GOAL #4   Title  Patient will self report greater ease and reduced speed in applying her own makeup daily    Baseline  Self report:  effort - 7/10     Status  On-going      OT LONG TERM GOAL #5   Title  Patient will demonstrate adequate strength, and coordination in dominant right hand to simulate med tech duties- taking blood pressure, height and weight, recording data in writing, blood draws    Baseline  Unable to work    Status  On-going            Plan - 06/03/17 1418    Clinical Impression Statement  Patient has returned for OT treatment following initial evaluation.  Patient reports improving hand strength and coordination, and is eager for increased function with ultimate goal of return to work.  Patient may benefit from a PT and prosthetis consult to address left partial foot amputation.  She currently does not have  prosthetic which would allow her to wear a shoe.       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 of hypertension, diabetes, neuropathy, CKD, back pain, decreased stand tolerance    Occupational performance deficits (Please refer to evaluation for details):  ADL's;IADL's;Work;Leisure;Social Participation    Rehab Potential  Excellent    OT Frequency  2x / week    OT Duration  8 weeks    OT Treatment/Interventions  Self-care/ADL training;Moist Heat;Fluidtherapy;DME and/or AE instruction;Splinting;Balance training;Aquatic Therapy;Therapeutic activities;Therapeutic exercise;Neuromuscular education;Functional Mobility Training;Patient/family education;Manual Therapy;Paraffin    Plan  Neuromuscular reeducation right UE mid to high reach without overuse of elevation, and trunk posture, initiate proximal HEP, hand strength HEP    Clinical Decision Making  Several treatment options, min-mod task modification necessary    Recommended Other Services  Prosthetics    Consulted and Agree with Plan of Care  Patient;Family member/caregiver    Family Member Consulted  Daughter Kaitlin       Patient will benefit from skilled therapeutic intervention in order to improve the following deficits and impairments:  Decreased coordination, Decreased range of motion, Difficulty walking, Improper body mechanics, Impaired sensation, Decreased activity tolerance, Decreased knowledge of precautions, Impaired tone, Impaired UE functional use, Decreased knowledge of use of DME, Decreased balance, Decreased strength, Impaired perceived functional ability  Visit Diagnosis: Hemiplegia and hemiparesis following cerebral infarction affecting right dominant side (HCC)  Other lack of coordination  Other disturbances of skin sensation  Muscle weakness (generalized)  Unsteadiness on feet    Problem  List Patient Active Problem List   Diagnosis Date Noted  . Cerebral thrombosis with cerebral infarction 04/08/2017  . Acute right-sided weakness 04/07/2017  . Slurred speech 04/07/2017  . Right sided weakness 04/07/2017  . Hyperhidrosis 09/01/2016  . Migraine with aura and without status migrainosus, not intractable 07/28/2016  . 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[place]) 08/04/2014  . CKD stage 3 due to type 2 diabetes mellitus ([place]) 06/27/2014  . Vitamin D insufficiency 05/08/2014  . Obesity (BMI 30.0-34.9) 05/08/2014  . Unilateral amputation of left foot ([place]) 05/08/2014  . Bursitis of left shoulder 02/14/2014  . Neck pain 01/03/2014  . Diabetes mellitus type 2, uncontrolled, with complications ([place]) 32/99/2426  . 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.23) 01/14/2013  . Insomnia 08/04/2012  . Diabetic neuropathy, painful ([place]) 08/04/2011  . Anxiety and depression 05/16/2010  . Essential hypertension, benign 11/01/2007  . Female stress incontinence 11/01/2007  . TOBACCO DEPENDENCE 04/30/2006  . GASTROESOPHAGEAL REFLUX, NO ESOPHAGITIS 04/30/2006  . Irritable bowel syndrome 04/30/2006    Mariah Milling, OTR/L 06/03/2017, 2:28 PM  [facility] [address] [place], [place], [address] Phone: [phone]   Fax:  [phone]  Name: [name] MRN: [id number] Date of Birth: 31-Jan-1966

## 2017-06-10 ENCOUNTER — Ambulatory Visit: Payer: Medicaid Other | Admitting: Physical Therapy

## 2017-06-10 ENCOUNTER — Ambulatory Visit: Payer: Medicaid Other | Admitting: Occupational Therapy

## 2017-06-12 ENCOUNTER — Encounter: Payer: Self-pay | Admitting: Podiatry

## 2017-06-12 ENCOUNTER — Ambulatory Visit: Payer: Medicaid Other | Admitting: Podiatry

## 2017-06-12 ENCOUNTER — Ambulatory Visit: Payer: Medicaid Other | Admitting: Physical Therapy

## 2017-06-12 VITALS — BP 155/74 | HR 70

## 2017-06-12 DIAGNOSIS — B351 Tinea unguium: Secondary | ICD-10-CM | POA: Diagnosis not present

## 2017-06-12 DIAGNOSIS — E1142 Type 2 diabetes mellitus with diabetic polyneuropathy: Secondary | ICD-10-CM

## 2017-06-12 DIAGNOSIS — M79674 Pain in right toe(s): Secondary | ICD-10-CM | POA: Diagnosis not present

## 2017-06-12 NOTE — Progress Notes (Addendum)
This patient presents to the office with chief complaint of long thick nails on her right foot and diabetic feet.  This patient  says he is having no pain and discomfort in her feet.  This patient says she has long thick painful nails on her right foot.  Her big toenail right foot fell off and has regrown.  These nails are painful walking and wearing shoes.  Patient has history of toe surgery which led to an infection and ultimately amputation toes left foot.  This patient presents the office today for treatment of the  long nails and a foot evaluation due to history of  diabetes.  General Appearance  Alert, conversant and in no acute stress.  Vascular  Dorsalis pedis and posterior tibial  pulses are palpable  bilaterally.  Capillary return is within normal limits  bilaterally. Temperature is within normal limits  bilaterally.  Neurologic  Senn-Weinstein monofilament wire test diminished  bilaterally. Muscle power within normal limits bilaterally.  Nails Thick disfigured discolored nails with subungual debris  from hallux to fifth toes right foot.. No evidence of bacterial infection or drainage bilaterally.  Orthopedic  No limitations of motion of motion feet .  No crepitus or effusions noted.  Skin  normotropic skin with no porokeratosis noted right .  No signs of infections or ulcers noted.  Asymptomatic callus right 1st MPJ.   Onychomycosis  Diabetes with neuropathy.  IE  Debride nails x 10.  A diabetic foot exam was performed and there is no evidence of any vascular.  Neuropathy presents.  RTC 3 months.   Gardiner Barefoot DPM

## 2017-06-15 ENCOUNTER — Encounter: Payer: Self-pay | Admitting: Occupational Therapy

## 2017-06-15 ENCOUNTER — Encounter: Payer: Medicaid Other | Admitting: Occupational Therapy

## 2017-06-15 ENCOUNTER — Ambulatory Visit: Payer: Medicaid Other | Admitting: Physical Therapy

## 2017-06-15 ENCOUNTER — Ambulatory Visit: Payer: Medicaid Other | Admitting: Occupational Therapy

## 2017-06-15 DIAGNOSIS — M6281 Muscle weakness (generalized): Secondary | ICD-10-CM

## 2017-06-15 DIAGNOSIS — R208 Other disturbances of skin sensation: Secondary | ICD-10-CM

## 2017-06-15 DIAGNOSIS — R278 Other lack of coordination: Secondary | ICD-10-CM

## 2017-06-15 DIAGNOSIS — I69351 Hemiplegia and hemiparesis following cerebral infarction affecting right dominant side: Secondary | ICD-10-CM | POA: Diagnosis not present

## 2017-06-15 DIAGNOSIS — R2681 Unsteadiness on feet: Secondary | ICD-10-CM

## 2017-06-15 NOTE — Therapy (Signed)
[facility] [address] [place], [place], [address] Phone: [phone]   Fax:  [phone]  Occupational Therapy Treatment  Patient Details  Name: [name] MRN: [id number] Date of Birth: 09/07/1965 Referring Provider: Dr. Leonie Man   Encounter Date: 06/15/2017  OT End of Session - 06/15/17 1637    Visit Number  3    Number of Visits  12    Authorization Type  Medicaid - OT will ask for initial 3 visits    Authorization - Visit Number  3    Authorization - Number of Visits  3    OT Start Time  5102    OT Stop Time  1530    OT Time Calculation (min)  43 min    Activity Tolerance  Patient tolerated treatment well    Behavior During Therapy  [facility] for tasks assessed/performed       Past Medical History:  Diagnosis Date  . Anxiety 2002  . Chest tightness   . Depression 2001  . Diabetes type 1, uncontrolled ([place])    at age 66  . Diabetic neuropathy ([place])   . Essential hypertension 2015  . GERD (gastroesophageal reflux disease)    about age of 60  . Nausea and vomiting in adult   . Urinary frequency   . Yeast vaginitis     Past Surgical History:  Procedure Laterality Date  . ACHILLES TENDON SURGERY Left 03/10/2013   Procedure: LEFT CHOPART AMPUTATION/ LEFT TENDON ACHILLES [place];  Surgeon: Wylene Simmer, MD;  Location: [facility];  Service: Orthopedics;  Laterality: Left;  . AMPUTATION Left 06/15/2012   Procedure: AMPUTATION DIGIT;  Surgeon: Meredith Pel, MD;  Location: [facility];  Service: Orthopedics;  Laterality: Left;  Left great toe revision amputation  . ENDOMETRIAL ABLATION    . LAPAROSCOPIC CHOLECYSTECTOMY  01/30/2015   [facility]     There were no vitals filed for this visit.  Subjective Assessment - 06/15/17 1631    Subjective   I have to think about it when I am drinking my coffee    Patient is accompained by:  Family member Daughter [name]    Currently in Pain?  No/denies    Multiple  Pain Sites  No                   OT Treatments/Exercises (OP) - 06/15/17 0001      ADLs   Cooking  Patient reports, and daughter confirms that she has cooked full meal, has pulled full casserole out of hot oven, and poured off full pot of pasta without difficulty since last session.      Writing  Patient's handwriting has returned to previous level, and she is very pleased with her progress.      Work  Patient now with adequate strength to squeeze bulb for BP cuff, simulated use of needle for blood draw, and patient able to safely maneuver needle into place and pull press plunger without difficulty.        Neurological Re-education Exercises   Other Exercises 1  Patient now with nearly full shoulder range of motion and 4+/5 strength, although fatigues more quickly than left.  Patient easily able to obtain a 2 lb weight from overhead at this point.              OT Education - 06/15/17 1636    Education provided  Yes    Education Details  Reviewed progress toward short and  long term goals- patient agreeable to OT discharge.  Will seek new PT evaluation to assess prosthetic options for walking    Person(s) Educated  Patient;Child(ren)    Methods  Explanation;Demonstration    Comprehension  Verbalized understanding       OT Short Term Goals - 06/15/17 1640      OT SHORT TERM GOAL #3   Title  Patient will utilize right shoulder flexion with elbow extension to reach and obtain 2 lb weight from chest height shelf while standing.    Status  Achieved        OT Long Term Goals - 06/15/17 1640      OT LONG TERM GOAL #1   Title  Patient will complete the 9 hole peg test in 38 seconds or less using right dominant hand to improve her ability to manipulate small items (due 5/26)    Status  Achieved      OT LONG TERM GOAL #2   Title  Patient will demonstrate effective use of bilateral upper extremities to lift a full casserole out of the oven without assistance    Status   Achieved      OT LONG TERM GOAL #3   Title  Patient will demonstrate effective use of bilateral upper extremities to pour off a pot of boiling water without assistance    Status  Achieved      OT LONG TERM GOAL #4   Title  Patient will self report greater ease and reduced speed in applying her own makeup daily    Status  Achieved      OT LONG TERM GOAL #5   Title  Patient will demonstrate adequate strength, and coordination in dominant right hand to simulate med tech duties- taking blood pressure, height and weight, recording data in writing, blood draws    Status  Achieved            Plan - 06/15/17 1638    Clinical Impression Statement  Patient has met and exceeded OT goals, and is ready and agreeable to OT discharge.  Discussed with patient and daughter recommendation to seek consult with PT and prosthetist for better long term solution for left partial foot prosthesis.  Patient currently in walking boot, which creates leg length discrepency, throws off balance, and causes her back pain.  Will seek MD referral for new PT order.      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, diabetes, neuropathy, CKD, back pain, decreased stand tolerance    Occupational performance deficits (Please refer to evaluation for details):  ADL's;IADL's;Work;Leisure;Social Participation    Rehab Potential  Excellent    OT Frequency  2x / week    OT Duration  8 weeks    OT Treatment/Interventions  Self-care/ADL training;Moist Heat;Fluidtherapy;DME and/or AE instruction;Splinting;Balance training;Aquatic Therapy;Therapeutic activities;Therapeutic exercise;Neuromuscular education;Functional Mobility Training;Patient/family education;Manual Therapy;Paraffin    Plan  discharge OT    Clinical Decision Making  Several treatment options, min-mod task modification necessary     Recommended Other Services  Prosthetics    Consulted and Agree with Plan of Care  Patient;Family member/caregiver    Family Member Consulted  Daughter [name]       Patient will benefit from skilled therapeutic intervention in order to improve the following deficits and impairments:  Decreased coordination, Decreased range of motion, Difficulty walking, Improper body mechanics, Impaired sensation, Decreased activity tolerance, Decreased knowledge of precautions, Impaired  tone, Impaired UE functional use, Decreased knowledge of use of DME, Decreased balance, Decreased strength, Impaired perceived functional ability  Visit Diagnosis: Hemiplegia and hemiparesis following cerebral infarction affecting right dominant side (HCC)  Other lack of coordination  Other disturbances of skin sensation  Muscle weakness (generalized)  Unsteadiness on feet    Problem List Patient Active Problem List   Diagnosis Date Noted  . Cerebral thrombosis with cerebral infarction 04/08/2017  . Acute right-sided weakness 04/07/2017  . Slurred speech 04/07/2017  . Right sided weakness 04/07/2017  . Hyperhidrosis 09/01/2016  . Migraine with aura and without status migrainosus, not intractable 07/28/2016  . 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[place]) 08/04/2014  . CKD stage 3 due to type 2 diabetes mellitus ([place]) 06/27/2014  . Vitamin D insufficiency 05/08/2014  . Obesity (BMI 30.0-34.9) 05/08/2014  . Unilateral amputation of left foot ([place]) 05/08/2014  . Bursitis of left shoulder 02/14/2014  . Neck pain 01/03/2014  . Diabetes mellitus type 2, uncontrolled, with complications ([place]) 38/32/9191  . 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.23) 01/14/2013  . Insomnia 08/04/2012  . Diabetic neuropathy, painful ([place]) 08/04/2011  . Anxiety and depression 05/16/2010  . Essential hypertension, benign 11/01/2007  . Female stress incontinence 11/01/2007  . TOBACCO DEPENDENCE  04/30/2006  . GASTROESOPHAGEAL REFLUX, NO ESOPHAGITIS 04/30/2006  . Irritable bowel syndrome 04/30/2006  OCCUPATIONAL THERAPY DISCHARGE SUMMARY  Visits from Start of Care: 3  Current functional level related to goals / functional outcomes: Mod I with ADL/IADL, Simulated work tasks  Remaining deficits: Patient with mild right inattention, and mild right sided weakness- hemisensory loss right - mild      Education / Equipment: HEP coordination, proper mechanics of reach Plan: Patient agrees to discharge.  Patient goals were met. Patient is being discharged due to meeting the stated rehab goals.  ?????         Will seek MD referral for PT for prosthetic recommendation for partial left foot to increase ability to stand and walk and return to work.    Mariah Milling 06/15/2017, 4:41 PM  [facility] [address] [place], [place], [address] Phone: [phone]   Fax:  [phone]  Name: [name] MRN: [id number] Date of Birth: [birth year]

## 2017-06-16 ENCOUNTER — Other Ambulatory Visit: Payer: Self-pay | Admitting: Neurology

## 2017-06-16 DIAGNOSIS — R269 Unspecified abnormalities of gait and mobility: Secondary | ICD-10-CM

## 2017-06-17 ENCOUNTER — Ambulatory Visit: Payer: Medicaid Other | Admitting: Physical Therapy

## 2017-06-24 ENCOUNTER — Ambulatory Visit: Payer: Medicaid Other | Admitting: Physical Therapy

## 2017-06-25 ENCOUNTER — Encounter: Payer: Self-pay | Admitting: Neurology

## 2017-06-25 ENCOUNTER — Ambulatory Visit: Payer: Medicaid Other | Admitting: Neurology

## 2017-06-25 VITALS — BP 157/79 | HR 71 | Ht 70.0 in | Wt 224.0 lb

## 2017-06-25 DIAGNOSIS — I6381 Other cerebral infarction due to occlusion or stenosis of small artery: Secondary | ICD-10-CM

## 2017-06-25 MED ORDER — TOPIRAMATE 25 MG PO TABS: 25.0000 mg | ORAL_TABLET | Freq: Two times a day (BID) | ORAL | 3 refills | Status: DC

## 2017-06-25 MED ORDER — HYDRALAZINE HCL 50 MG PO TABS: 50.0000 mg | ORAL_TABLET | Freq: Two times a day (BID) | ORAL | 0 refills | Status: DC

## 2017-06-25 NOTE — Patient Instructions (Signed)
I had a long d/w patient and her daughter about her recent lacunar  stroke, risk for recurrent stroke/TIAs, personally independently reviewed imaging studies and stroke evaluation results and answered questions.Continue aspirin 81 mg daily  for secondary stroke prevention and maintain strict control of hypertension with blood pressure goal below 130/90, diabetes with hemoglobin A1c goal below 6.5% and lipids with LDL cholesterol goal below 70 mg/dL. I also advised the patient to eat a healthy diet with plenty of whole grains, cereals, fruits and vegetables, exercise regularly and maintain ideal body weight . Trial of Topamax 25 mg twice daily for diabetic neuropathy pain to be increase if tolerated without side effects.Followup in the future with my nurse practitioner in 3 months or call earlier if necessary   Stroke Prevention Some medical conditions and behaviors are associated with a higher chance of having a stroke. You can help prevent a stroke by making nutrition, lifestyle, and other changes, including managing any medical conditions you may have. What nutrition changes can be made?  Eat healthy foods. You can do this by: ? Choosing foods high in fiber, such as fresh fruits and vegetables and whole grains. ? Eating at least 5 or more servings of fruits and vegetables a day. Try to fill half of your plate at each meal with fruits and vegetables. ? Choosing lean protein foods, such as lean cuts of meat, poultry without skin, fish, tofu, beans, and nuts. ? Eating low-fat dairy products. ? Avoiding foods that are high in salt (sodium). This can help lower blood pressure. ? Avoiding foods that have saturated fat, trans fat, and cholesterol. This can help prevent high cholesterol. ? Avoiding processed and premade foods.  Follow your health care provider's specific guidelines for losing weight, controlling high blood pressure (hypertension), lowering high cholesterol, and managing diabetes. These may  include: ? Reducing your daily calorie intake. ? Limiting your daily sodium intake to 1,500 milligrams (mg). ? Using only healthy fats for cooking, such as olive oil, canola oil, or sunflower oil. ? Counting your daily carbohydrate intake. What lifestyle changes can be made?  Maintain a healthy weight. Talk to your health care provider about your ideal weight.  Get at least 30 minutes of moderate physical activity at least 5 days a week. Moderate activity includes brisk walking, biking, and swimming.  Do not use any products that contain nicotine or tobacco, such as cigarettes and e-cigarettes. If you need help quitting, ask your health care provider. It may also be helpful to avoid exposure to secondhand smoke.  Limit alcohol intake to no more than 1 drink a day for nonpregnant women and 2 drinks a day for men. One drink equals 12 oz of beer, 5 oz of wine, or 1 oz of hard liquor.  Stop any illegal drug use.  Avoid taking birth control pills. Talk to your health care provider about the risks of taking birth control pills if: ? You are over 51 years old. ? You smoke. ? You get migraines. ? You have ever had a blood clot. What other changes can be made?  Manage your cholesterol levels. ? Eating a healthy diet is important for preventing high cholesterol. If cholesterol cannot be managed through diet alone, you may also need to take medicines. ? Take any prescribed medicines to control your cholesterol as told by your health care provider.  Manage your diabetes. ? Eating a healthy diet and exercising regularly are important parts of managing your blood sugar. If your blood sugar  cannot be managed through diet and exercise, you may need to take medicines. ? Take any prescribed medicines to control your diabetes as told by your health care provider.  Control your hypertension. ? To reduce your risk of stroke, try to keep your blood pressure below 130/80. ? Eating a healthy diet and  exercising regularly are an important part of controlling your blood pressure. If your blood pressure cannot be managed through diet and exercise, you may need to take medicines. ? Take any prescribed medicines to control hypertension as told by your health care provider. ? Ask your health care provider if you should monitor your blood pressure at home. ? Have your blood pressure checked every year, even if your blood pressure is normal. Blood pressure increases with age and some medical conditions.  Get evaluated for sleep disorders (sleep apnea). Talk to your health care provider about getting a sleep evaluation if you snore a lot or have excessive sleepiness.  Take over-the-counter and prescription medicines only as told by your health care provider. Aspirin or blood thinners (antiplatelets or anticoagulants) may be recommended to reduce your risk of forming blood clots that can lead to stroke.  Make sure that any other medical conditions you have, such as atrial fibrillation or atherosclerosis, are managed. What are the warning signs of a stroke? The warning signs of a stroke can be easily remembered as BEFAST.  B is for balance. Signs include: ? Dizziness. ? Loss of balance or coordination. ? Sudden trouble walking.  E is for eyes. Signs include: ? A sudden change in vision. ? Trouble seeing.  F is for face. Signs include: ? Sudden weakness or numbness of the face. ? The face or eyelid drooping to one side.  A is for arms. Signs include: ? Sudden weakness or numbness of the arm, usually on one side of the body.  S is for speech. Signs include: ? Trouble speaking (aphasia). ? Trouble understanding.  T is for time. ? These symptoms may represent a serious problem that is an emergency. Do not wait to see if the symptoms will go away. Get medical help right away. Call your local emergency services (911 in the [place] Do not drive yourself to the hospital.  Other signs of stroke  may include: ? A sudden, severe headache with no known cause. ? Nausea or vomiting. ? Seizure.  Where to find more information: For more information, visit:  American Stroke Association: www.strokeassociation.org  National Stroke Association: www.stroke.org  Summary  You can prevent a stroke by eating healthy, exercising, not smoking, limiting alcohol intake, and managing any medical conditions you may have.  Do not use any products that contain nicotine or tobacco, such as cigarettes and e-cigarettes. If you need help quitting, ask your health care provider. It may also be helpful to avoid exposure to secondhand smoke.  Remember BEFAST for warning signs of stroke. Get help right away if you or a loved one has any of these signs. This information is not intended to replace advice given to you by your health care provider. Make sure you discuss any questions you have with your health care provider. Document Released: 03/27/2004 Document Revised: 03/25/2016 Document Reviewed: 03/25/2016 Elsevier Interactive Patient Education  [employer].

## 2017-06-25 NOTE — Progress Notes (Signed)
[facility] [address] [place]. [place] [address] [phone]       OFFICE FOLLOW-UP NOTE  [name] Date of Birth:  Jan 04, 1966 Medical Record Number:  [id number]   HPI: [name] is a 52 year old Caucasian lady seen today for the first initial office follow-up visit following hospital admission for stroke and 72 2019. She is accompanied by her daughter. History is obtained from them and review of electronic medical records. I have personally reviewed imaging films.[name] is an 52 y.o. female with known past medical history of hypertension, diabetes who takes no antiplatelets and states that her blood pressure is usually in the 130s/90s.  Patient comes to the ED after waking up this morning and having a fall secondary to right leg weakness.  She stated that she noted that her right arm and right leg was weak and that her speech was off.  Upon entering the ED her blood pressure was 220/91, patient is dysarthric, and she shows mild weakness in the right arm and leg.  She has no sensory issues.  Currently her blood pressure still elevated at 222/88.  ED is administering. States that she was to pack a day smoker but has weaned herself down to 1 pack a day. Date last known well: Date: 04/06/2017 Time last known well: Time: 21:00 tPA Given: No: Out of the window Modified Rankin: Rankin Score=0 NIHSS stroke scale 3 MRI scan of the brain showed small acute to early subacute infarcts involving left posterior limb internal capsule and right splenium corpus callosum which were felt to be due to small vessel disease.MRA of the brain showed no large vessel intracranial stenosis or occlusion.transthoracic echo showed normal ejection fraction.Hemoglobin A1c was elevated at 9.8. LDL cholesterol was elevated at 11 mg percent. Patient also had history of smoking. She was started on dual antiplatelet therapy aspirin and Plavix for 3 weeks followed by aspirin alone. She states  she's tolerated these medications well without any side effects. She has cut back smoking but has not quit completely yet. She is opting improvement in her speech and right hand weakness but still complains of some diminished fine motor skills and occasionally has some speech difficulties when she is tired.She is bothered by neuropathic pain in her feet which she has from diabetic neuropathy. She has tried gabapentin and Lyrica in the past which did not work well. She started nortriptyline as well and stopped it abruptly a few weeks ago. 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 She states her blood pressure is doing much better. Her sugars have also improved.   ROS:   14 system review of systems is positive for  Fatigue, ear discharge, nausea, diarrhea, incontinence of bowels, restless leg, insomnia, daytime sleepiness, snoring, numbness, facial drooping, depression and anxiety and all other systems negative PMH:  Past Medical History:  Diagnosis Date  . Anxiety 2002  . Chest tightness   . Depression 2001  . Diabetes type 1, uncontrolled ([place])    at age 49  . Diabetic neuropathy ([place])   . Essential hypertension 2015  . GERD (gastroesophageal reflux disease)    about age of 18  . Nausea and vomiting in adult   . Stroke ([place])   . Urinary frequency   . Yeast vaginitis     Social History:  Social History   Socioeconomic History  . Marital status: Divorced    Spouse  name: Not on file  . Number of children: 1  . Years of education: Not on file  . Highest education level: Not on file  Occupational History    Employer: [employer]  . Financial resource strain: Not on file  . Food insecurity:    Worry: Not on file    Inability: Not on file  . Transportation needs:    Medical: Not on file    Non-medical: Not on file  Tobacco Use  . Smoking status: Light Tobacco Smoker    Packs/day: 0.50     Years: 22.00    Pack years: 11.00    Types: Cigarettes  . Smokeless tobacco: Never Used  Substance and Sexual Activity  . Alcohol use: Yes    Alcohol/week: 0.0 oz    Comment: sociial  . Drug use: No  . Sexual activity: Yes    Partners: Male  Lifestyle  . Physical activity:    Days per week: Not on file    Minutes per session: Not on file  . Stress: Not on file  Relationships  . Social connections:    Talks on phone: Not on file    Gets together: Not on file    Attends religious service: Not on file    Active member of club or organization: Not on file    Attends meetings of clubs or organizations: Not on file    Relationship status: Not on file  . Intimate partner violence:    Fear of current or ex partner: Not on file    Emotionally abused: Not on file    Physically abused: Not on file    Forced sexual activity: Not on file  Other Topics Concern  . Not on file  Social History Narrative   Lives at home with her daughter   Right handed   Caffeine: 3 cups daily    Medications:   Current Outpatient Medications on File Prior to Visit  Medication Sig Dispense Refill  . acetaminophen-codeine (TYLENOL #4) 300-60 MG tablet TAKE ONE TABLET BY MOUTH EVERY 8 HOURS AS NEEDED MOD  PAIN (Patient taking differently: Take 1 tablet by mouth 2 (two) times daily. ) 90 tablet 0  . aluminum chloride (DRYSOL) 20 % external solution Apply topically at bedtime. 35 mL 5  . amLODipine (NORVASC) 10 MG tablet Take 1 tablet (10 mg total) by mouth daily. 90 tablet 3  . aspirin EC 81 MG EC tablet Take 1 tablet (81 mg total) by mouth daily. 30 tablet 0  . Blood Glucose Monitoring Suppl (ACCU-CHEK AVIVA PLUS) W/DEVICE KIT CHECK BLOOD SUGARS THREE TIMES A DAY.  DX:250.03 1 kit 0  . calcium citrate-vitamin D 500-400 MG-UNIT chewable tablet Chew 1 tablet by mouth 2 (two) times daily. 180 tablet 1  . Cholecalciferol ([employer] VIT D 5000 HIGH-POTENCY) 5000 units capsule Take 5,000 Units by mouth daily.    .  cyclobenzaprine (FLEXERIL) 10 MG tablet Take 1 tablet (10 mg total) by mouth 3 (three) times daily as needed for muscle spasms. 60 tablet 3  . diphenoxylate-atropine (LOMOTIL) 2.5-0.025 MG tablet Take 2 tablets by mouth 3 (three) times daily before meals. Please take thirty (30) minutes prior to meal. 180 tablet 5  . DULoxetine (CYMBALTA) 60 MG capsule Take 1 capsule (60 mg total) by mouth daily. 90 capsule 3  . fluticasone (FLONASE) 50 MCG/ACT nasal spray Place 2 sprays into both nostrils daily. (Patient taking differently: Place 2 sprays into both nostrils daily as needed. )  16 g 6  . glucose blood (ACCU-CHEK AVIVA PLUS) test strip USE TO CHECK BLOOD SUGAR BEFORE MEALS AND AT BEDTIME dx code E10.65 125 each 3  . hydrochlorothiazide (HYDRODIURIL) 25 MG tablet Take 1 tablet (25 mg total) by mouth daily. 90 tablet 3  . insulin aspart (NOVOLOG FLEXPEN) 100 UNIT/ML FlexPen Inject 13 Units into the skin 3 (three) times daily with meals. 15 mL 6  . Insulin Glargine (LANTUS SOLOSTAR) 100 UNIT/ML Solostar Pen INJECT 55 UNITS SUBCUTANEOUSLY ONCE DAILY (Patient taking differently: Inject 55-60 Units into the skin at bedtime. Per pt based on sliding scale) 15 mL 3  . promethazine (PHENERGAN) 25 MG tablet Take 1 tablet (25 mg total) by mouth every 8 (eight) hours as needed for nausea or vomiting. 20 tablet 0  . sitaGLIPtin (JANUVIA) 50 MG tablet Take 1 tablet (50 mg total) by mouth daily. 30 tablet 5  . alosetron (LOTRONEX) 0.5 MG tablet Take 0.5 mg by mouth 2 (two) times daily.  6  . atorvastatin (LIPITOR) 40 MG tablet Take 1 tablet (40 mg total) by mouth daily at 6 PM. (Patient not taking: Reported on 06/25/2017) 30 tablet 0  . DEXILANT 60 MG capsule Take 60 mg by mouth daily.  6  . zolpidem (AMBIEN) 5 MG tablet TAKE ONE TABLET BY MOUTH ONCE DAILY AT BEDTIME AS NEEDED FOR SLEEP (Patient not taking: Reported on 06/25/2017) 30 tablet 2   No current facility-administered medications on file prior to visit.      Allergies:   Allergies  Allergen Reactions  . Lidocaine Itching  . Latex Rash    Physical Exam General: obese middle-age Caucasian lady seated, in no evident distress Head: head normocephalic and atraumatic.  Neck: supple with no carotid or supraclavicular bruits Cardiovascular: regular rate and rhythm, no murmurs Musculoskeletal: no deformity.left ankle boot due to osteomyelitis Skin:  no rash/petichiae Vascular:  Normal pulses all extremities Vitals:   06/25/17 1122  BP: (!) 157/79  Pulse: 71   Neurologic Exam Mental Status: Awake and fully alert. Oriented to place and time. Recent and remote memory intact. Attention span, concentration and fund of knowledge appropriate. Mood and affect appropriate.  Cranial Nerves: Fundoscopic exam reveals sharp disc margins. Pupils equal, briskly reactive to light. Extraocular movements full without nystagmus. Visual fields full to confrontation. Hearing intact. Facial sensation intact. Face, tongue, palate moves normally and symmetrically.  Motor: Normal bulk and tone. Normal strength in all tested extremity muscles. Sensory.: diminished touch ,pinprick .position and vibratory sensation rom ankle down bilaterally.  Coordination: Rapid alternating movements normal in all extremities. Finger-to-nose and heel-to-shin performed accurately bilaterally. Gait and Station: Arises from chair without difficulty. Stance is normal. Gait is cautious with favoring of the left foot . Not able to heel, toe and tandem walk without difficulty.  Reflexes: 1+ and symmetric. Toes downgoing.   NIHSS  1 Modified Rankin  2   ASSESSMENT: 52 year-old Caucasian lady with lacunar (infarct) every 2019 due to small vessel disease. Multiple vascular risk factors of diabetes, hypertension, hyperlipidemia, obesity and smoking. She also has diabetic neuropathy with paresthesias in her feet.    PLAN: I had a long d/w patient and her daughter about her recent lacunar   stroke, risk for recurrent stroke/TIAs, personally independently reviewed imaging studies and stroke evaluation results and answered questions.Continue aspirin 81 mg daily  for secondary stroke prevention and maintain strict control of hypertension with blood pressure goal below 130/90, diabetes with hemoglobin A1c goal below 6.5% and lipids  with LDL cholesterol goal below 70 mg/dL. I also advised the patient to eat a healthy diet with plenty of whole grains, cereals, fruits and vegetables, exercise regularly and maintain ideal body weight . Trial of Topamax 25 mg twice daily for diabetic neuropathy pain to be increase if tolerated without side effects.Followup in the future with my nurse practitioner in 3 months or call earlier if necessary Greater than 50% of time during this 25 minute visit was spent on counseling,explanation of diagnosis of lacunar infarct, planning of further management, discussion with patient and family and coordination of care Antony Contras, MD  [facility] [address] [place], [place] [address]  Phone [phone] Fax [phone] Note: This document was prepared with digital dictation and possible smart phrase technology. Any transcriptional errors that result from this process are unintentional

## 2017-06-26 ENCOUNTER — Ambulatory Visit: Payer: Medicaid Other | Admitting: Physical Therapy

## 2017-07-01 ENCOUNTER — Ambulatory Visit: Payer: Medicaid Other | Admitting: Physical Therapy

## 2017-07-01 ENCOUNTER — Encounter: Payer: Medicaid Other | Admitting: Occupational Therapy

## 2017-07-03 ENCOUNTER — Ambulatory Visit: Payer: Medicaid Other | Admitting: Physical Therapy

## 2017-07-04 DIAGNOSIS — Z8673 Personal history of transient ischemic attack (TIA), and cerebral infarction without residual deficits: Secondary | ICD-10-CM | POA: Insufficient documentation

## 2017-07-04 DIAGNOSIS — E785 Hyperlipidemia, unspecified: Secondary | ICD-10-CM | POA: Insufficient documentation

## 2017-07-08 ENCOUNTER — Ambulatory Visit: Payer: Medicaid Other | Admitting: Physical Therapy

## 2017-07-10 ENCOUNTER — Ambulatory Visit: Payer: Medicaid Other | Admitting: Physical Therapy

## 2017-07-14 ENCOUNTER — Encounter: Payer: Self-pay | Admitting: Internal Medicine

## 2017-07-14 NOTE — Progress Notes (Signed)
Note from Dr. Posey Pronto of [facility]. Pt seen 07/01/2017. Creat 3.2, eGFR 16, H/H 12.837.4.  Advised to d/c HCTZ[employer] Hydralazine to 50 mg BID.

## 2017-07-15 ENCOUNTER — Ambulatory Visit: Payer: Medicaid Other | Admitting: Physical Therapy

## 2017-07-17 ENCOUNTER — Ambulatory Visit: Payer: Medicaid Other | Admitting: Physical Therapy

## 2017-09-05 ENCOUNTER — Other Ambulatory Visit: Payer: Self-pay | Admitting: Internal Medicine

## 2017-09-05 DIAGNOSIS — K588 Other irritable bowel syndrome: Secondary | ICD-10-CM

## 2017-09-16 ENCOUNTER — Telehealth: Payer: Self-pay | Admitting: Internal Medicine

## 2017-09-16 DIAGNOSIS — E1140 Type 2 diabetes mellitus with diabetic neuropathy, unspecified: Secondary | ICD-10-CM

## 2017-09-16 NOTE — Telephone Encounter (Signed)
Could you refill?

## 2017-09-16 NOTE — Telephone Encounter (Signed)
Patient called and requested for listed medication to be refill and sent to [address] [facility]  DULoxetine (CYMBALTA) 60 MG capsule [[id number]]

## 2017-09-17 MED ORDER — DULOXETINE HCL 60 MG PO CPEP: 60.0000 mg | ORAL_CAPSULE | Freq: Every day | ORAL | 0 refills | Status: DC

## 2017-09-17 NOTE — Telephone Encounter (Signed)
Refill sent on Cymbalta 60 mg once a day for 1 month supply.

## 2017-09-17 NOTE — Telephone Encounter (Signed)
I can't refill this pt hasn't had appt since 01/2017, will fwd to provider

## 2017-09-24 ENCOUNTER — Ambulatory Visit: Payer: Medicaid Other | Admitting: Family Medicine

## 2017-09-25 ENCOUNTER — Ambulatory Visit: Payer: Medicaid Other | Admitting: Adult Health

## 2017-09-25 ENCOUNTER — Telehealth: Payer: Self-pay

## 2017-09-25 NOTE — Progress Notes (Deleted)
[facility] [address] [place]. [place] [address] [phone]       OFFICE FOLLOW-UP NOTE  [name] Date of Birth:  [birth year] Medical Record Number:  [id number]   HPI: [name] is a 52 year old Caucasian lady seen today for the first initial office follow-up visit following hospital admission for stroke and 72 2019. She is accompanied by her daughter. History is obtained from them and review of electronic medical records. I have personally reviewed imaging films.  History: [name] is an 52 y.o. female with known past medical history of hypertension, diabetes who takes no antiplatelets and states that her blood pressure is usually in the 130s/90s.  Patient comes to the ED after waking up this morning and having a fall secondary to right leg weakness.  She stated that she noted that her right arm and right leg was weak and that her speech was off.  Upon entering the ED her blood pressure was 220/91, patient is dysarthric, and she shows mild weakness in the right arm and leg.  She has no sensory issues.  Currently her blood pressure still elevated at 222/88.  ED is administering. States that she was to pack a day smoker but has weaned herself down to 1 pack a day.Date last known well: Date: 04/06/2017 Time last known well: Time: 21:00 tPA Given: No: Out of the window Modified Rankin: Rankin Score=0 NIHSS stroke scale 3. MRI scan of the brain showed small acute to early subacute infarcts involving left posterior limb internal capsule and right splenium corpus callosum which were felt to be due to small vessel disease.MRA of the brain showed no large vessel intracranial stenosis or occlusion.transthoracic echo showed normal ejection fraction.Hemoglobin A1c was elevated at 9.8. LDL cholesterol was elevated at 11 mg percent. Patient also had history of smoking. She was started on dual antiplatelet therapy aspirin and Plavix for 3 weeks followed by aspirin alone.     06/25/17 visit PS: She states she's tolerated these medications well without any side effects. She has cut back smoking but has not quit completely yet. She is opting improvement in her speech and right hand weakness but still complains of some diminished fine motor skills and occasionally has some speech difficulties when she is tired.She is bothered by neuropathic pain in her feet which she has from diabetic neuropathy. She has tried gabapentin and Lyrica in the past which did not work well. She started nortriptyline as well and stopped it abruptly a few weeks ago. 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 She states her blood pressure is doing much better. Her sugars have also improved.  09/25/17 UPDATE:    ROS:   14 system review of systems is positive for  Fatigue, ear discharge, nausea, diarrhea, incontinence of bowels, restless leg, insomnia, daytime sleepiness, snoring, numbness, facial drooping, depression and anxiety and all other systems negative PMH:  Past Medical History:  Diagnosis Date  . Anxiety 2002  . Chest tightness   . Depression 2001  . Diabetes type 1, uncontrolled ([place])    at age 70  . Diabetic neuropathy ([place])   . Essential hypertension 2015  . GERD (gastroesophageal reflux disease)    about age of 29  . Nausea and vomiting in adult   . Stroke ([place])   . Urinary frequency   . Yeast vaginitis     Social History:  Social History  Socioeconomic History  . Marital status: Divorced    Spouse name: Not on file  . Number of children: 1  . Years of education: Not on file  . Highest education level: Not on file  Occupational History    Employer: [employer]  . Financial resource strain: Not on file  . Food insecurity:    Worry: Not on file    Inability: Not on file  . Transportation needs:    Medical: Not on file    Non-medical: Not on file  Tobacco Use  .  Smoking status: Light Tobacco Smoker    Packs/day: 0.50    Years: 22.00    Pack years: 11.00    Types: Cigarettes  . Smokeless tobacco: Never Used  Substance and Sexual Activity  . Alcohol use: Yes    Alcohol/week: 0.0 oz    Comment: sociial  . Drug use: No  . Sexual activity: Yes    Partners: Male  Lifestyle  . Physical activity:    Days per week: Not on file    Minutes per session: Not on file  . Stress: Not on file  Relationships  . Social connections:    Talks on phone: Not on file    Gets together: Not on file    Attends religious service: Not on file    Active member of club or organization: Not on file    Attends meetings of clubs or organizations: Not on file    Relationship status: Not on file  . Intimate partner violence:    Fear of current or ex partner: Not on file    Emotionally abused: Not on file    Physically abused: Not on file    Forced sexual activity: Not on file  Other Topics Concern  . Not on file  Social History Narrative   Lives at home with her daughter   Right handed   Caffeine: 3 cups daily    Medications:   Current Outpatient Medications on File Prior to Visit  Medication Sig Dispense Refill  . acetaminophen-codeine (TYLENOL #4) 300-60 MG tablet TAKE ONE TABLET BY MOUTH EVERY 8 HOURS AS NEEDED MOD  PAIN (Patient taking differently: Take 1 tablet by mouth 2 (two) times daily. ) 90 tablet 0  . alosetron (LOTRONEX) 0.5 MG tablet Take 0.5 mg by mouth 2 (two) times daily.  6  . aluminum chloride (DRYSOL) 20 % external solution Apply topically at bedtime. 35 mL 5  . amLODipine (NORVASC) 10 MG tablet Take 1 tablet (10 mg total) by mouth daily. 90 tablet 3  . aspirin EC 81 MG EC tablet Take 1 tablet (81 mg total) by mouth daily. 30 tablet 0  . atorvastatin (LIPITOR) 40 MG tablet Take 1 tablet (40 mg total) by mouth daily at 6 PM. (Patient not taking: Reported on 06/25/2017) 30 tablet 0  . Blood Glucose Monitoring Suppl (ACCU-CHEK AVIVA PLUS) W/DEVICE  KIT CHECK BLOOD SUGARS THREE TIMES A DAY.  DX:250.03 1 kit 0  . calcium citrate-vitamin D 500-400 MG-UNIT chewable tablet Chew 1 tablet by mouth 2 (two) times daily. 180 tablet 1  . Cholecalciferol ([employer] VIT D 5000 HIGH-POTENCY) 5000 units capsule Take 5,000 Units by mouth daily.    . cyclobenzaprine (FLEXERIL) 10 MG tablet Take 1 tablet (10 mg total) by mouth 3 (three) times daily as needed for muscle spasms. 60 tablet 3  . DEXILANT 60 MG capsule Take 60 mg by mouth daily.  6  . diphenoxylate-atropine (LOMOTIL) 2.5-0.025  MG tablet TAKE 2 TABLETS BY MOUTH THREE TIMES DAILY BEFORE MEAL(S) PLEASE  TAKE  30  MINUTES  PRIOR  TO  MEAL 180 tablet 0  . DULoxetine (CYMBALTA) 60 MG capsule Take 1 capsule (60 mg total) by mouth daily. 30 capsule 0  . fluticasone (FLONASE) 50 MCG/ACT nasal spray Place 2 sprays into both nostrils daily. (Patient taking differently: Place 2 sprays into both nostrils daily as needed. ) 16 g 6  . glucose blood (ACCU-CHEK AVIVA PLUS) test strip USE TO CHECK BLOOD SUGAR BEFORE MEALS AND AT BEDTIME dx code E10.65 125 each 3  . hydrALAZINE (APRESOLINE) 50 MG tablet Take 1 tablet (50 mg total) by mouth 2 (two) times daily. 60 tablet 0  . hydrochlorothiazide (HYDRODIURIL) 25 MG tablet Take 1 tablet (25 mg total) by mouth daily. 90 tablet 3  . insulin aspart (NOVOLOG FLEXPEN) 100 UNIT/ML FlexPen Inject 13 Units into the skin 3 (three) times daily with meals. 15 mL 6  . Insulin Glargine (LANTUS SOLOSTAR) 100 UNIT/ML Solostar Pen INJECT 55 UNITS SUBCUTANEOUSLY ONCE DAILY (Patient taking differently: Inject 55-60 Units into the skin at bedtime. Per pt based on sliding scale) 15 mL 3  . promethazine (PHENERGAN) 25 MG tablet Take 1 tablet (25 mg total) by mouth every 8 (eight) hours as needed for nausea or vomiting. 20 tablet 0  . sitaGLIPtin (JANUVIA) 50 MG tablet Take 1 tablet (50 mg total) by mouth daily. 30 tablet 5  . topiramate (TOPAMAX) 25 MG tablet Take 1 tablet (25 mg total) by mouth 2  (two) times daily. 120 tablet 3  . zolpidem (AMBIEN) 5 MG tablet TAKE ONE TABLET BY MOUTH ONCE DAILY AT BEDTIME AS NEEDED FOR SLEEP (Patient not taking: Reported on 06/25/2017) 30 tablet 2   No current facility-administered medications on file prior to visit.     Allergies:   Allergies  Allergen Reactions  . Lidocaine Itching  . Latex Rash    Physical Exam General: obese middle-age Caucasian lady seated, in no evident distress Head: head normocephalic and atraumatic.  Neck: supple with no carotid or supraclavicular bruits Cardiovascular: regular rate and rhythm, no murmurs Musculoskeletal: no deformity.left ankle boot due to osteomyelitis Skin:  no rash/petichiae Vascular:  Normal pulses all extremities There were no vitals filed for this visit. Neurologic Exam Mental Status: Awake and fully alert. Oriented to place and time. Recent and remote memory intact. Attention span, concentration and fund of knowledge appropriate. Mood and affect appropriate.  Cranial Nerves: Fundoscopic exam reveals sharp disc margins. Pupils equal, briskly reactive to light. Extraocular movements full without nystagmus. Visual fields full to confrontation. Hearing intact. Facial sensation intact. Face, tongue, palate moves normally and symmetrically.  Motor: Normal bulk and tone. Normal strength in all tested extremity muscles. Sensory.: diminished touch ,pinprick .position and vibratory sensation rom ankle down bilaterally.  Coordination: Rapid alternating movements normal in all extremities. Finger-to-nose and heel-to-shin performed accurately bilaterally. Gait and Station: Arises from chair without difficulty. Stance is normal. Gait is cautious with favoring of the left foot . Not able to heel, toe and tandem walk without difficulty.  Reflexes: 1+ and symmetric. Toes downgoing.   NIHSS  1 Modified Rankin  2   ASSESSMENT: 52 year-old Caucasian lady with lacunar (infarct) every 2019 due to small vessel  disease. Multiple vascular risk factors of diabetes, hypertension, hyperlipidemia, obesity and smoking. She also has diabetic neuropathy with paresthesias in her feet.    PLAN: -Continue {anticoagulants:31417}  and ***  for secondary  stroke prevention -F/u with PCP regarding your *** management -continue to monitor BP at home  -Maintain strict control of hypertension with blood pressure goal below 130/90, diabetes with hemoglobin A1c goal below 6.5% and cholesterol with LDL cholesterol (bad cholesterol) goal below 70 mg/dL. I also advised the patient to eat a healthy diet with plenty of whole grains, cereals, fruits and vegetables, exercise regularly and maintain ideal body weight.  Follow up in *** or call earlier if needed   .Continue aspirin 81 mg daily  for secondary stroke   Trial of Topamax 25 mg twice daily for diabetic neuropathy pain to be increase if tolerated   Greater than 50% of time during this 25 minute visit was spent on counseling,explanation of diagnosis of lacunar infarct, planning of further management, discussion with patient and family and coordination of care  Venancio Poisson, AGNP-BC  [facility] [address] [place], [place] [address]  Phone [phone] Fax [phone] Note: This document was prepared with digital dictation and possible smart phrase technology. Any transcriptional errors that result from this process are unintentional.

## 2017-09-25 NOTE — Telephone Encounter (Signed)
Patient was a no show to her appointment with Janett Billow, NP today.

## 2017-09-28 ENCOUNTER — Encounter: Payer: Self-pay | Admitting: Adult Health

## 2017-10-06 ENCOUNTER — Encounter: Payer: Self-pay | Admitting: Family Medicine

## 2017-10-06 ENCOUNTER — Ambulatory Visit: Payer: Medicaid Other | Attending: Family Medicine | Admitting: Family Medicine

## 2017-10-06 VITALS — BP 153/72 | HR 76 | Temp 98.7°F | Resp 18 | Ht 70.0 in | Wt 216.0 lb

## 2017-10-06 DIAGNOSIS — E1142 Type 2 diabetes mellitus with diabetic polyneuropathy: Secondary | ICD-10-CM | POA: Insufficient documentation

## 2017-10-06 DIAGNOSIS — K219 Gastro-esophageal reflux disease without esophagitis: Secondary | ICD-10-CM | POA: Insufficient documentation

## 2017-10-06 DIAGNOSIS — Z9111 Patient's noncompliance with dietary regimen: Secondary | ICD-10-CM

## 2017-10-06 DIAGNOSIS — Z8673 Personal history of transient ischemic attack (TIA), and cerebral infarction without residual deficits: Secondary | ICD-10-CM | POA: Diagnosis not present

## 2017-10-06 DIAGNOSIS — N183 Chronic kidney disease, stage 3 unspecified: Secondary | ICD-10-CM

## 2017-10-06 DIAGNOSIS — E1151 Type 2 diabetes mellitus with diabetic peripheral angiopathy without gangrene: Secondary | ICD-10-CM | POA: Insufficient documentation

## 2017-10-06 DIAGNOSIS — E1122 Type 2 diabetes mellitus with diabetic chronic kidney disease: Secondary | ICD-10-CM | POA: Diagnosis not present

## 2017-10-06 DIAGNOSIS — I739 Peripheral vascular disease, unspecified: Secondary | ICD-10-CM

## 2017-10-06 DIAGNOSIS — F329 Major depressive disorder, single episode, unspecified: Secondary | ICD-10-CM | POA: Diagnosis not present

## 2017-10-06 DIAGNOSIS — G8929 Other chronic pain: Secondary | ICD-10-CM | POA: Diagnosis not present

## 2017-10-06 DIAGNOSIS — I129 Hypertensive chronic kidney disease with stage 1 through stage 4 chronic kidney disease, or unspecified chronic kidney disease: Secondary | ICD-10-CM | POA: Diagnosis not present

## 2017-10-06 DIAGNOSIS — E1140 Type 2 diabetes mellitus with diabetic neuropathy, unspecified: Secondary | ICD-10-CM | POA: Diagnosis not present

## 2017-10-06 DIAGNOSIS — K589 Irritable bowel syndrome without diarrhea: Secondary | ICD-10-CM | POA: Diagnosis not present

## 2017-10-06 DIAGNOSIS — F419 Anxiety disorder, unspecified: Secondary | ICD-10-CM | POA: Diagnosis not present

## 2017-10-06 DIAGNOSIS — Z5181 Encounter for therapeutic drug level monitoring: Secondary | ICD-10-CM | POA: Insufficient documentation

## 2017-10-06 DIAGNOSIS — Z79899 Other long term (current) drug therapy: Secondary | ICD-10-CM | POA: Diagnosis not present

## 2017-10-06 DIAGNOSIS — E1121 Type 2 diabetes mellitus with diabetic nephropathy: Secondary | ICD-10-CM | POA: Insufficient documentation

## 2017-10-06 DIAGNOSIS — E118 Type 2 diabetes mellitus with unspecified complications: Secondary | ICD-10-CM | POA: Diagnosis not present

## 2017-10-06 DIAGNOSIS — F17210 Nicotine dependence, cigarettes, uncomplicated: Secondary | ICD-10-CM | POA: Insufficient documentation

## 2017-10-06 DIAGNOSIS — Z91199 Patient's noncompliance with other medical treatment and regimen due to unspecified reason: Secondary | ICD-10-CM

## 2017-10-06 DIAGNOSIS — IMO0002 Reserved for concepts with insufficient information to code with codable children: Secondary | ICD-10-CM

## 2017-10-06 DIAGNOSIS — F32A Depression, unspecified: Secondary | ICD-10-CM

## 2017-10-06 DIAGNOSIS — E1165 Type 2 diabetes mellitus with hyperglycemia: Secondary | ICD-10-CM | POA: Diagnosis not present

## 2017-10-06 LAB — POCT GLYCOSYLATED HEMOGLOBIN (HGB A1C): HbA1c, POC (controlled diabetic range): 7 % (ref 0.0–7.0)

## 2017-10-06 LAB — GLUCOSE, POCT (MANUAL RESULT ENTRY): POC Glucose: 176 mg/dL — AB (ref 70–99)

## 2017-10-06 MED ORDER — DULOXETINE HCL 60 MG PO CPEP: 60.0000 mg | ORAL_CAPSULE | Freq: Every day | ORAL | 6 refills | Status: DC

## 2017-10-06 MED ORDER — HYDRALAZINE HCL 50 MG PO TABS: 50.0000 mg | ORAL_TABLET | Freq: Three times a day (TID) | ORAL | 11 refills | Status: DC

## 2017-10-06 MED ORDER — ACCU-CHEK AVIVA PLUS W/DEVICE KIT: PACK | 0 refills | Status: DC

## 2017-10-06 MED ORDER — ACETAMINOPHEN-CODEINE #3 300-30 MG PO TABS: 1.0000 | ORAL_TABLET | Freq: Three times a day (TID) | ORAL | 3 refills | Status: DC | PRN

## 2017-10-06 MED ORDER — GLUCOSE BLOOD VI STRP: ORAL_STRIP | 6 refills | Status: DC

## 2017-10-06 NOTE — Progress Notes (Signed)
Subjective:    Patient ID: [name], female    DOB: 04-22-1965, 52 y.o.   MRN: [id number]  HPI  52 yo female new to me a patient as who presents in follow-up of chronic medical issues.  Patient has not been seen here in this office since December 2018.  Patient reports that in February of this year she was admitted to the hospital due to right-sided weakness and was told that she had findings consistent with a stroke.  Patient states that she is here today in follow-up of chronic medical issues including her diabetes, hypertension, chronic pain secondary to peripheral neuropathy and for refill of Cymbalta for anxiety/depression.  Patient also states that the Cymbalta helps with her neuropathic pain.  Patient also has chronic kidney disease and states that she needs to reschedule with her nephrologist.  Patient also needs to reschedule her podiatry appointment.  Patient states that she is currently wearing a walking boot that was given to her in the past by podiatry as she states that her prosthetic did not fit well and she is waiting to receive a new one.  Patient with complaint of chronic pain in her feet/legs secondary to nerve damage from her diabetes.  Patient would like to have a new prescription for Tylenol 3 as well as refill of Cymbalta.  Patient also states that she was taken off of some of her blood pressure medications by her nephrologist.        Patient states that her blood sugars have been well controlled with fasting blood sugars generally 130 or less.  Patient however states that approximately 3 weeks ago her glucometer stopped working.  Patient states that she needs a new glucometer as well as test strips.  Patient denies any increased thirst other than that caused by her medications.  Patient denies urinary frequency.  Patient denies any changes in vision.  Patient denies any chest pain or palpitations, no shortness of breath or cough.  Patient has some occasional mild abdominal pain  associated with her IBS. Past Medical History:  Diagnosis Date  . Anxiety 2002  . Chest tightness   . Depression 2001  . Diabetes type 1, uncontrolled ([place])    at age 51  . Diabetic neuropathy ([place])   . Essential hypertension 2015  . GERD (gastroesophageal reflux disease)    about age of 25  . Nausea and vomiting in adult   . Stroke ([place])   . Urinary frequency   . Yeast vaginitis    Social History   Tobacco Use  . Smoking status: Light Tobacco Smoker    Packs/day: 0.50    Years: 22.00    Pack years: 11.00    Types: Cigarettes  . Smokeless tobacco: Never Used  Substance Use Topics  . Alcohol use: Yes    Alcohol/week: 0.0 oz    Comment: sociial  . Drug use: No   Allergies  Allergen Reactions  . Lidocaine Itching  . Latex Rash     Review of Systems  Constitutional: Positive for fatigue.  Eyes: Negative for visual disturbance.  Respiratory: Negative for cough and shortness of breath.   Cardiovascular: Negative for chest pain, palpitations and leg swelling.  Gastrointestinal: Negative for blood in stool and vomiting.  Genitourinary: Negative for dysuria, flank pain and frequency.  Musculoskeletal: Positive for back pain, gait problem and myalgias.  Neurological: Positive for weakness and numbness.  Hematological: Does not bruise/bleed easily.  Psychiatric/Behavioral: Positive for sleep disturbance. Negative for suicidal  ideas.       Objective:   Physical Exam  Constitutional: She is oriented to person, place, and time. She appears well-developed and well-nourished. No distress.  HENT:  Mouth/Throat: Oropharynx is clear and moist.  Neck: Normal range of motion. Neck supple. No thyromegaly present.  Cardiovascular: Normal rate and regular rhythm.  Right dorsalis pedis and posterior tibial pulses were nonpalpable on exam.  Patient however with normal capillary refill and no evidence of cyanosis  Pulmonary/Chest: Effort normal and breath sounds normal.  Abdominal:  Soft. There is no tenderness.  Musculoskeletal:  Left foot was not examined secondary to presence of a walking boot and patient was encouraged to follow-up with her podiatrist  Neurological: She is alert and oriented to person, place, and time. No cranial nerve deficit.  Skin:  Right foot did not exhibit any significant skin changes other than a callus on the outside of the base of the right great toe  Psychiatric: She has a normal mood and affect.     BP (!) 153/72 (BP Location: Left Arm, Patient Position: Sitting, Cuff Size: Normal)   Pulse 76   Temp 98.7 F (37.1 C) (Oral)   Resp 18   Ht 5' 10" (1.778 m)   Wt 216 lb (98 kg)   SpO2 95%   BMI 30.99 kg/m    vital signs reviewed        Assessment & Plan:  1. Diabetes mellitus type 2, uncontrolled, with complications ([place]) Patient with improvement in control of diabetes and hemoglobin A1c at today's visit was 7.0.  Patient has diabetic neuropathy, nephropathy and is status post partial amputation of the left foot secondary to PAD and diabetes.  Discussed importance of maintaining hemoglobin A1c is 7 or less.  Patient is to continue her current medications.  2. Diabetic neuropathy, painful ([place]) Patient with complaint of chronic, painful diabetic neuropathy and requested refill of Tylenol 3 which was provided at today's visit.  Patient was made aware that this is a narcotic medication and can cause dependence/addiction.  Patient also made aware of risk of constipation with use of narcotic medication.  Patient is responsible for safe storage of the medication and early refills will not be provided if medication is lost or stolen.  Patient also given a refill of duloxetine for neuropathic pain as well as treatment of anxiety/depression.  Because of patient's poor renal function, duloxetine may need to be discontinued if she continues to have worsening of her creatinine.  Patient was encouraged to follow-up with her nephrologist. -  DULoxetine (CYMBALTA) 60 MG capsule; Take 1 capsule (60 mg total) by mouth daily.  Dispense: 30 capsule; Refill: 6 - acetaminophen-codeine (TYLENOL #3) 300-30 MG tablet; Take 1 tablet by mouth every 8 (eight) hours as needed for moderate pain.  Dispense: 30 tablet; Refill: 3  3. Controlled type 2 diabetes with neuropathy ([place]) patient's hemoglobin A1c in office at today's visit was 7.0 which is at goal.  Patient has had past A1c showing poor control of her diabetes.  Patient states that her glucometer is not working and patient was provided with prescriptions for new glucometer and strips which were sent to her pharmacist.  Patient currently endorses 3 times daily glucose monitoring (while her glucometer was working). - Glucose (CBG) - HgB A1c - Blood Glucose Monitoring Suppl (ACCU-CHEK AVIVA PLUS) w/Device KIT; CHECK BLOOD SUGARS THREE TIMES A DAY.  DX:250.03  Dispense: 1 kit; Refill: 0 - Comprehensive metabolic panel  4. CKD stage 3 due  to type 2 diabetes mellitus ([place]) I could not find a recent note from patient's nephrologist.  Patient had reported to myself as well as to nurse that she had had changes in her blood pressure medication by nephrology.  Patient will have CMP and CBC at today's visit and these results will be shared with her nephrologist and new referral will be placed but since patient is established, she should also call for a nephrology appointment in follow-up of her chronic kidney disease. - Comprehensive metabolic panel - CBC with Differential  5. Encounter for long-term (current) use of high-risk medication Patient is on high risk medications and will have labs in follow-up. - Comprehensive metabolic panel - CBC with Differential  6. Controlled diabetes mellitus type 2 with complications [facility]) Patient with A1c at today's visit of 7.0 indicating current control of her diabetes but patient has had past uncontrolled diabetes  which have resulted in chronic kidney  disease/nephropathy and chronic pain secondary to neuropathy.  I suspect that her improved control of her diabetes is related to worsening of her renal function which causes her insulin to be degraded more slowly thereby improving her blood sugar control - glucose blood (ACCU-CHEK AVIVA PLUS) test strip; USE TO CHECK BLOOD SUGAR BEFORE MEALS AND AT BEDTIME dx code E10.65  Dispense: 125 each; Refill: 6  7. Hypertension associated with chronic kidney disease due to type 2 diabetes mellitus ([place]) Blood pressure is elevated at today's visit.  Patient reports that some of her blood pressure medications were changed as she reports discontinuation of some medicines by her nephrologist but she could not remember exactly when these changes occurred.  Patient's dose of hydralazine will be increased from twice daily to 3 times daily to help with control of her blood pressure.  Patient is encouraged to follow-up in 1 to 2 weeks with clinical pharmacist here for blood pressure recheck and possible change in medications if warranted to help better control her blood pressure as patient also has history of stroke in February of this year as well as her chronic kidney disease/nephropathy. 8. Anxiety and depression Patient reports that she feels that her symptoms are currently controlled with the use of duloxetine but this medication may need to be changed at a future visit due to patient's chronic kidney disease. 9.  Peripheral arterial disease Patient is to follow-up with vascular surgery as well as podiatry regarding history of partial amputation of her left foot.  Patient also encouraged to continue daily aspirin, Lipitor as well as complete smoking cessation.  Continue to control blood sugars.  An After Visit Summary was printed and given to the patient.  Return in about 3 months (around 01/06/2018) for f/u chronic conditions; 2 week BP f/u with CPP Lurena Joiner).

## 2017-10-06 NOTE — Patient Instructions (Signed)
Basics of Medicine Management What should I do when I am taking medicines?  Read all of the labels and the inserts that come with your medicines. Review the information often.  Talk with your pharmacist if you notice a change in the size, color, or shape of your medicines.  Try to get all of your medicines at one pharmacy. The pharmacist will have all your information and will understand possible drug interactions.  Ask your health care provider any questions that you have about your prescribed medicines and any over-the-counter medicines, vitamins, and herbal or dietary supplements that you take. It is important to make sure that nothing will interact with any of your prescribed medicines. What should I know about my medicines?  Know the potential side effects for each medicine that you take.  Know what each of your medicines looks like. This includes size, color, and shape. ? If you are getting confused and having trouble recognizing your different medicines, ask your health care provider or pharmacist about changing your medicines or helping you to identify them more easily. How can I take my medicines safely?  Take medicines only as directed by your health care provider. ? Do not take more of your medicine than instructed. ? Do not take anyone else's medicines. ? Do not share your medicines with other people. ? Do not stop taking your medicines unless you have talked about that with your health care provider. ? You may need to avoid alcohol or certain foods or liquids with one or more of your medicines. Follow your health care provider's instructions.  Do not split, mash, or chew your medicines unless your health care provider tells you to do so. Tell your health care provider if you have trouble swallowing your medicines.  For every liquid medicine, use the dosing container that was provided. How should I organized my medicines?  Use a tool, such as a weekly pillbox, a written  chart from your health care provider, a notebook, or your own calendar to organize your medicine schedule.  If you have trouble recognizing your different medicines, keep them in their original bottles.  Create reminders for taking your medicines. Use sticky notes, or use alarms on your watch, mobile device, or phone calendar.  Your organization system should help you to remember the following information about each medicine: ? Name of the medicine. ? Dosage. ? Schedule. This includes the day and time when it should be taken. ? Appearance. This includes color, shape, size, and stamp. ? How to take your medicines. You may need to take them with or without certain foods, on an empty stomach, with fluids, or by following some other instruction.  More advanced medicine management systems are also available. These offer weekly or monthly options that are complete with storage, alarms, and visual and audio prompts.  Review your medicine schedule with a family member, friend, or caregiver. Other household members should understand your medicines.  If you have trouble reading the names of your different medicines, ask your pharmacist to provide your medicines in containers with large print.  If you take any medicines on an "as needed" basis, such as medicines for nausea or pain, it is important that you remember what you have taken and when you did so. Write down the following information each time you take an "as needed" medicine: the name, the dosage, and the date and time that you took it. How should I plan ahead for travel?  Take your pillbox, medicines, and organization  system with you when you travel.  Have your medicines refilled before you leave for travel. This will ensure that you do not run out of your medicines while you are away from home.  Always carry an updated list of your medicines with you. If there is an emergency, a respondent can quickly see what medicines you are taking. How  should I store and discard my medicines?  Store medicines in a cool, dry area away from light or as directed by your pharmacist or health care provider. The bathroom is not a good place for medicine storage because of heat and humidity.  Store your medicines away from chemicals, medicines for your pet, and medicines of other household members.  Keep medicines where children cannot reach them. Do not leave them on counters or bedside tables. Store them in high cabinets or on high shelves.  Check expiration dates regularly. Do not take expired medicines. Discard medicines that are older than the expiration date.  Learn about the best way to dispose of each medicine that you take. Find out if your local government recycling program, hospital, or pharmacy has a medicine take-back program for safe disposal. If not, some medicines may be mixed with inedible substances and thrown away in the trash in a sealed bag or empty container. What should I remember?  Tell your health care provider if you experience side effects, you have new symptoms, or you have other concerns. There may be dosing changes or alternative medicines that would be better for you.  Review your medicines regularly with your health care provider. Ask if you need to continue to take each medicine, and discuss how well each one is working. Medicines, diet, medical conditions, weight changes, and other habits can all affect how medicines work.  Refill your medicines early so that you do not risk running out.  In case of an accidental overdose, call your local [place] at [phone] or visit your local emergency department immediately. This is important. What should I know about giving medicines to my child?  Use positive reinforcement to help your child take necessary medicines. Try singing, cuddling, and rewards.  Use only the syringes, droppers, dosing spoons, or dosing cups from your child's health care provider or  pharmacist.  Always wash your hands before giving medicines.  Learn about the medicine policies at your child's school. ? Meet with the school nurse to review your child's medicine schedule in detail. ? Do not send oral medicines to school with your child.  If your child has trouble taking medicine, forgets a dose, or spits it up, talk with his or her health care provider.  Do not give over-the-counter cough and cold medicines to your child who is younger than 47 years old, unless directed by his or her health care provider.  Do not give your child aspirin unless instructed to do so by your child's pediatrician or cardiologist.  Make sure that your child knows how to use an inhaler properly, if needed. This information is not intended to replace advice given to you by your health care provider. Make sure you discuss any questions you have with your health care provider. Document Released: 06/04/2010 Document Revised: 10/31/2015 Document Reviewed: 10/20/2013 Elsevier Interactive Patient Education  2018 [employer].

## 2017-10-07 ENCOUNTER — Telehealth: Payer: Self-pay | Admitting: *Deleted

## 2017-10-07 LAB — CBC WITH DIFFERENTIAL/PLATELET
Basophils Absolute: 0.1 x10E3/uL (ref 0.0–0.2)
Basos: 1 %
EOS (ABSOLUTE): 0.3 x10E3/uL (ref 0.0–0.4)
Eos: 2 %
Hematocrit: 38.3 % (ref 34.0–46.6)
Hemoglobin: 12.5 g/dL (ref 11.1–15.9)
Immature Grans (Abs): 0.1 x10E3/uL (ref 0.0–0.1)
Immature Granulocytes: 1 %
Lymphocytes Absolute: 3.7 x10E3/uL — ABNORMAL HIGH (ref 0.7–3.1)
Lymphs: 25 %
MCH: 27.8 pg (ref 26.6–33.0)
MCHC: 32.6 g/dL (ref 31.5–35.7)
MCV: 85 fL (ref 79–97)
Monocytes Absolute: 1.1 x10E3/uL — ABNORMAL HIGH (ref 0.1–0.9)
Monocytes: 8 %
Neutrophils Absolute: 9.6 x10E3/uL — ABNORMAL HIGH (ref 1.4–7.0)
Neutrophils: 63 %
Platelets: 368 x10E3/uL (ref 150–450)
RBC: 4.49 x10E6/uL (ref 3.77–5.28)
RDW: 14.9 % (ref 12.3–15.4)
WBC: 15 x10E3/uL — ABNORMAL HIGH (ref 3.4–10.8)

## 2017-10-07 LAB — COMPREHENSIVE METABOLIC PANEL WITH GFR
ALT: 9 IU/L (ref 0–32)
AST: 12 IU/L (ref 0–40)
Albumin/Globulin Ratio: 1.2 (ref 1.2–2.2)
Albumin: 4.1 g/dL (ref 3.5–5.5)
Alkaline Phosphatase: 116 IU/L (ref 39–117)
BUN/Creatinine Ratio: 13 (ref 9–23)
BUN: 40 mg/dL — ABNORMAL HIGH (ref 6–24)
Bilirubin Total: <0.2 mg/dL (ref 0.0–1.2)
CO2: 22 mmol/L (ref 20–29)
Calcium: 9.7 mg/dL (ref 8.7–10.2)
Chloride: 105 mmol/L (ref 96–106)
Creatinine, Ser: 3.08 mg/dL — ABNORMAL HIGH (ref 0.57–1.00)
GFR calc Af Amer: 19 mL/min/1.73 — ABNORMAL LOW
GFR calc non Af Amer: 17 mL/min/1.73 — ABNORMAL LOW
Globulin, Total: 3.3 g/dL (ref 1.5–4.5)
Glucose: 112 mg/dL — ABNORMAL HIGH (ref 65–99)
Potassium: 5.2 mmol/L (ref 3.5–5.2)
Sodium: 143 mmol/L (ref 134–144)
Total Protein: 7.4 g/dL (ref 6.0–8.5)

## 2017-10-07 NOTE — Addendum Note (Signed)
Addended by: Kathrene Bongo on: 10/07/2017 01:42 PM   Modules accepted: Orders

## 2017-10-07 NOTE — Telephone Encounter (Signed)
Left message on voicemail for patient to return call. Unable to reach.    Notes recorded by Antony Blackbird, MD on 10/07/2017 at 12:11 PM EDT Notify patient that her Cr is now at 3.08 and it is very important that she follow-up with her kidney doctor, she should go to the ED if she is not feeling well. Please fax copy of results to her nephrologist- confirm which doctor- she previously stated Dr. Posey Pronto I believe. Her white blood cell count is also elevated at 15- please ask her to follow-up this week or next for repeat blood work but make clinic appointment if any signs/symptoms of infection or go to the ED

## 2017-10-09 NOTE — Telephone Encounter (Signed)
2nd attempt to contact patient to inform of results. Unable to reach. Left message on voicemail to return call.

## 2017-10-13 ENCOUNTER — Telehealth: Payer: Self-pay | Admitting: *Deleted

## 2017-10-13 ENCOUNTER — Other Ambulatory Visit: Payer: Self-pay

## 2017-10-13 ENCOUNTER — Telehealth: Payer: Self-pay

## 2017-10-13 ENCOUNTER — Ambulatory Visit: Payer: Medicaid Other | Admitting: Podiatry

## 2017-10-13 ENCOUNTER — Telehealth: Payer: Self-pay | Admitting: Internal Medicine

## 2017-10-13 ENCOUNTER — Ambulatory Visit: Payer: Medicaid Other | Attending: Family Medicine | Admitting: Family Medicine

## 2017-10-13 VITALS — BP 145/65 | HR 78 | Temp 99.1°F | Resp 18 | Ht 70.0 in | Wt 218.2 lb

## 2017-10-13 DIAGNOSIS — N183 Chronic kidney disease, stage 3 unspecified: Secondary | ICD-10-CM

## 2017-10-13 DIAGNOSIS — R1030 Lower abdominal pain, unspecified: Secondary | ICD-10-CM

## 2017-10-13 DIAGNOSIS — E1122 Type 2 diabetes mellitus with diabetic chronic kidney disease: Secondary | ICD-10-CM

## 2017-10-13 DIAGNOSIS — N3000 Acute cystitis without hematuria: Secondary | ICD-10-CM | POA: Diagnosis not present

## 2017-10-13 LAB — POCT UA - GLUCOSE/PROTEIN
Glucose, UA: NEGATIVE
Protein, UA: POSITIVE — AB

## 2017-10-13 MED ORDER — SULFAMETHOXAZOLE-TRIMETHOPRIM 800-160 MG PO TABS: 1.0000 | ORAL_TABLET | Freq: Two times a day (BID) | ORAL | 0 refills | Status: AC

## 2017-10-13 NOTE — Telephone Encounter (Signed)
Pt called back to get lab results. Name and DOB verified.  Pt aware of results of lab test. She had some concerns about elevated white count she was informed about. She states she feels like she may have a UTI, cramping as if she is about to come on her cycle, does not feel like she is able to empty her bladder during the day but has a better flow HS. Denies frequency, odor, or dysuria. Informed Dr. Chapman Fitch of sx's. Pt was instructed as per the result note to f/u with her Nephrologist, Dr. Posey Pronto. She states she has his information already, for she is a patient there. She was informed that she could make an appointment and to return for lab test. Informed that sx's were most likely secondary to her decreased kidney function. Pt verbalized understanding.

## 2017-10-13 NOTE — Progress Notes (Signed)
   Subjective:    Patient ID: [name], female    DOB: November 27, 1965, 52 y.o.   MRN: [id number]  HPI  52 yo female seen for work-in appointment as she has complaint of decreased urinary frequency during the day from about  11 am -7 pm but then at night and especially after she goes to bed she has onset of frequent urination that wakes her up. This has been occurring for over a week.  She has also noticed some lower mid-abdominal cramping. No fever or chills. She has had some fatigue. She has not yet followed up with her kidney doctor. She is waiting for a call back regarding an appointment. She has not noticed any change in the odor or color of her urine. No burning with urination. Patient does think that she has a UTI. She denies any increase in back pain.    Review of Systems  Constitutional: Positive for fatigue. Negative for chills and fever.  Respiratory: Negative for cough and shortness of breath.   Cardiovascular: Negative for chest pain and palpitations.  Gastrointestinal: Positive for abdominal pain (cramping in lower abdomen). Negative for nausea.  Genitourinary: Positive for frequency (later in the day and at night). Negative for dysuria and flank pain.  Neurological: Negative for dizziness and headaches.       Objective:   Physical Exam  Constitutional: She appears well-developed and well-nourished. No distress.  Cardiovascular: Normal rate and regular rhythm.  Pulmonary/Chest: Effort normal and breath sounds normal.  Abdominal: Soft. There is tenderness (suprapubic tenderness to palp). There is no rebound and no guarding.  Musculoskeletal: She exhibits no edema or tenderness (no CVA tenderness).  Nursing note and vitals reviewed. BP (!) 145/65 (BP Location: Left Arm, Patient Position: Sitting, Cuff Size: Normal)   Pulse 78   Temp 99.1 F (37.3 C) (Oral)   Resp 18   Ht 5\' 10"  (1.778 m)   Wt 218 lb 3.2 oz (99 kg)   SpO2 95%   BMI 31.31 kg/m       Assessment &  Plan:  1. Lower abdominal pain, unspecified Will check UA due to lower abdominal pain and complaint of decreased daytime urinary frequency with nighttime frequency - Urinalysis - Glucose/Protein  2. Acute cystitis without hematuria UA was abnormal with presence of small leukocytes consistent with UTI; will placed patient on Septra DS and send urine for culture - Urine Culture - sulfamethoxazole-trimethoprim (BACTRIM DS,SEPTRA DS) 800-160 MG tablet; Take 1 tablet by mouth 2 (two) times daily for 3 days.  Dispense: 6 tablet; Refill: 0  3. CKD stage 3 due to type 2 diabetes mellitus ([place]) Will repeat the BMP at today's visit as her Cr was  3.08 at her last visit. Patient is also aware that she needs to re-establish follow-up with her nephrologist - Basic Metabolic Panel   An After Visit Summary was printed and given to the patient.  Return if symptoms worsen or fail to improve, for keep scheduled follow-up.

## 2017-10-13 NOTE — Patient Instructions (Signed)

## 2017-10-13 NOTE — Telephone Encounter (Signed)
Having issues obtaining PA, Dr. Chapman Fitch is not yet associated with my [facility] so we have to submit via fax. Pharmacy was notified to fill #21 tabs as a 7 day supply which medicaid covered. They will notify patient that we have found a temporary solution.   I will speak with Dr. Chapman Fitch, if she is treating a chronic condition we will obtain a PA if not the patient will have to fill prn in 7 day increments.

## 2017-10-13 NOTE — Addendum Note (Signed)
Addended by: Kathrene Bongo on: 10/13/2017 05:02 PM   Modules accepted: Orders

## 2017-10-13 NOTE — Progress Notes (Signed)
Patient stated that she was hoping to get some relief from her neuropathy pain and check up on a possible uti.Marland Kitchen

## 2017-10-13 NOTE — Telephone Encounter (Signed)
[name], a representative from Dr. Serita Grit office called [facility] to ask why [name] needed to come to their office for UTI? She didn't feel an OV with the Nephrologist office was appropriate. Statement was corrected and informed [name] that [name] was instructed to follow up with Dr. Posey Pronto regarding her results: Cr. 3.08. [name] was informed the symptoms that patient gave over the phone and that message was then relayed to her PCP who stated that she would see that patient but there may not be much she can do. It may be r/t her kidneys.  [name] was informed that the patient was not denied an appointment to be seen here, in fact she also was instructed to return to the office to repeat her CBC.  Per Tilda Burrow, [name] is due for a follow up at Dr. Serita Grit office.    [name] was contacted and clarified any miscommunication about returning to [facility] for OV, repeat lab work, or question of UTI. She was put on the schedule with PCP. Again, encouraged [name]  to call Dr. Serita Grit office to follow- up. She was informed that she was past due for an OV.

## 2017-10-13 NOTE — Telephone Encounter (Signed)
Unable to reach patient. Left message on voicemail.

## 2017-10-14 ENCOUNTER — Telehealth: Payer: Self-pay | Admitting: Internal Medicine

## 2017-10-14 LAB — BASIC METABOLIC PANEL WITH GFR
BUN/Creatinine Ratio: 12 (ref 9–23)
BUN: 39 mg/dL — ABNORMAL HIGH (ref 6–24)
CO2: 19 mmol/L — ABNORMAL LOW (ref 20–29)
Calcium: 9.3 mg/dL (ref 8.7–10.2)
Chloride: 105 mmol/L (ref 96–106)
Creatinine, Ser: 3.18 mg/dL — ABNORMAL HIGH (ref 0.57–1.00)
GFR calc Af Amer: 19 mL/min/1.73 — ABNORMAL LOW
GFR calc non Af Amer: 16 mL/min/1.73 — ABNORMAL LOW
Glucose: 196 mg/dL — ABNORMAL HIGH (ref 65–99)
Potassium: 5 mmol/L (ref 3.5–5.2)
Sodium: 139 mmol/L (ref 134–144)

## 2017-10-15 LAB — URINE CULTURE

## 2017-10-20 ENCOUNTER — Other Ambulatory Visit: Payer: Self-pay | Admitting: Internal Medicine

## 2017-10-20 ENCOUNTER — Telehealth: Payer: Self-pay | Admitting: *Deleted

## 2017-10-20 DIAGNOSIS — E118 Type 2 diabetes mellitus with unspecified complications: Principal | ICD-10-CM

## 2017-10-20 DIAGNOSIS — IMO0002 Reserved for concepts with insufficient information to code with codable children: Secondary | ICD-10-CM

## 2017-10-20 DIAGNOSIS — E1165 Type 2 diabetes mellitus with hyperglycemia: Secondary | ICD-10-CM

## 2017-10-20 NOTE — Telephone Encounter (Signed)
Patient verified DOB Patient is aware of e coli being noted on culture and needing to complete the medication prescribed in office. Patient states she has completed the 3 day supply. Patient has appointment with kidney specialist on 10/22/17. No further questions.

## 2017-10-20 NOTE — Telephone Encounter (Signed)
-----   Message from Antony Blackbird, MD sent at 10/15/2017  1:41 PM EDT ----- Please notify patient that her urine culture was positive for E. Coli but this bacteria is sensitive to the antibiotic that was prescribed. Please ask her to schedule a follow-up appointment if she continues to have issues.

## 2017-10-22 DIAGNOSIS — I129 Hypertensive chronic kidney disease with stage 1 through stage 4 chronic kidney disease, or unspecified chronic kidney disease: Secondary | ICD-10-CM | POA: Diagnosis not present

## 2017-10-22 DIAGNOSIS — N189 Chronic kidney disease, unspecified: Secondary | ICD-10-CM | POA: Diagnosis not present

## 2017-10-22 DIAGNOSIS — N184 Chronic kidney disease, stage 4 (severe): Secondary | ICD-10-CM | POA: Diagnosis not present

## 2017-10-22 DIAGNOSIS — D631 Anemia in chronic kidney disease: Secondary | ICD-10-CM | POA: Diagnosis not present

## 2017-10-22 DIAGNOSIS — N2581 Secondary hyperparathyroidism of renal origin: Secondary | ICD-10-CM | POA: Diagnosis not present

## 2017-10-26 ENCOUNTER — Ambulatory Visit: Payer: Medicaid Other | Attending: Family Medicine | Admitting: Pharmacist

## 2017-10-26 VITALS — BP 166/75 | HR 73

## 2017-10-26 DIAGNOSIS — E1122 Type 2 diabetes mellitus with diabetic chronic kidney disease: Secondary | ICD-10-CM

## 2017-10-26 DIAGNOSIS — I10 Essential (primary) hypertension: Secondary | ICD-10-CM | POA: Insufficient documentation

## 2017-10-26 DIAGNOSIS — F17210 Nicotine dependence, cigarettes, uncomplicated: Secondary | ICD-10-CM | POA: Diagnosis not present

## 2017-10-26 DIAGNOSIS — I129 Hypertensive chronic kidney disease with stage 1 through stage 4 chronic kidney disease, or unspecified chronic kidney disease: Secondary | ICD-10-CM | POA: Diagnosis not present

## 2017-10-26 DIAGNOSIS — Z79899 Other long term (current) drug therapy: Secondary | ICD-10-CM | POA: Insufficient documentation

## 2017-10-26 NOTE — Progress Notes (Signed)
   S:    PCP: Dr. Wynetta Emery  Patient arrives in good spirits. Presents to the clinic for hypertension evaluation, counseling, and management. Patient was referred and last seen by Dr. Chapman Fitch on 10/06/17. BP at that visit was 153/72. Hydralazine increased to 50 mg TID.   Today, pt denies CP, SOB, HA, or dizziness. Patient denies adherence with medications.  Current BP Medications include:   - amlodipine 10 mg daily. Reports not taking. - hydralazine 50 mg TID - reports that her nephrologist started carvedilol but this is not documented in [facility]  Antihypertensives tried in the past include:  - lisinopril 10 mg daily  - lisinopril 20 mg daily  - lisinopril 40 mg daily  Dietary habits include:  - limits salt - reports being DASH-compliant  Exercise habits include: - patient is limited d/t lower left foot  Family / Social history:  - Tobacco: patient reports smoking 1/2 pack per day - Alcohol: reports drinking socially  Home BP readings:  - takes BP at home  - gives range of 130s/70s  O:  BP taken in L arm after 5 minutes rest: 166/75, HR 73  Last 3 Office BP readings: BP Readings from Last 3 Encounters:  10/13/17 (!) 145/65  10/06/17 (!) 153/72  06/25/17 (!) 157/79   BMET    Component Value Date/Time   NA 139 10/13/2017 1635   K 5.0 10/13/2017 1635   CL 105 10/13/2017 1635   CO2 19 (L) 10/13/2017 1635   GLUCOSE 196 (H) 10/13/2017 1635   GLUCOSE 176 (H) 04/10/2017 0817   BUN 39 (H) 10/13/2017 1635   CREATININE 3.18 (H) 10/13/2017 1635   CREATININE 1.92 (H) 01/10/2016 1528   CALCIUM 9.3 10/13/2017 1635   GFRNONAA 16 (L) 10/13/2017 1635   GFRNONAA 30 (L) 01/10/2016 1528   GFRAA 19 (L) 10/13/2017 1635   GFRAA 35 (L) 01/10/2016 1528    Renal function: Estimated Creatinine Clearance: 27 mL/min (A) (by C-G formula based on SCr of 3.18 mg/dL (H)).  A/P: Hypertension longstanding currently uncontrolled on medications. BP Goal <130/80 mmHg. Patient is not adherent with current  medications.   Of note, she reports compliance with hydralazine TID but states that her nephrologist took her off of other anti-hypertensive medications. She states that the nephrologist prescribed carvedilol but I do not see this in [facility]. Advised pt to bring in medications next time. I have asked her to create a BP log and bring this in to follow-up with me. Will reassess in two weeks.   -Continued anti-hypertensives.  -Counseled on lifestyle modifications for blood pressure control including reduced dietary sodium, increased exercise, adequate sleep  Results reviewed and written information provided.   Total time in face-to-face counseling 15 minutes.   F/U Clinic Visit 11/09/17.   Benard Halsted, PharmD, [place] [phone]

## 2017-10-26 NOTE — Patient Instructions (Addendum)
Thank you for coming to see [place] today.   Blood pressure today is elevated.   Start taking carvedilol as instructed by your kidney doctor and continue taking hydralazine 50 mg 3 times daily.  Limiting salt and caffeine, as well as exercising as able for at least 30 minutes for 5 days out of the week, can also help you lower your blood pressure.  Take your blood pressure at home if you are able. Please write down these numbers and bring them to your visits.  If you have any questions about medications, please call me [phone].  Lurena Joiner

## 2017-11-04 ENCOUNTER — Telehealth: Payer: Self-pay | Admitting: Internal Medicine

## 2017-11-04 MED ORDER — INSULIN LISPRO 100 UNIT/ML (KWIKPEN): 13.0000 [IU] | PEN_INJECTOR | Freq: Three times a day (TID) | SUBCUTANEOUS | 6 refills | Status: DC

## 2017-11-04 NOTE — Telephone Encounter (Signed)
Key: [id number]  Luellen Pucker called requesting status of PA, for flex pen.

## 2017-11-04 NOTE — Telephone Encounter (Signed)
Rx sent for Humalog.

## 2017-11-04 NOTE — Telephone Encounter (Signed)
Insurance prefers Humalog. 

## 2017-11-04 NOTE — Addendum Note (Signed)
Addended by: Daisy Blossom, Annie Main L on: 11/04/2017 03:12 PM   Modules accepted: Orders

## 2017-11-05 ENCOUNTER — Ambulatory Visit: Payer: Medicaid Other | Admitting: Family Medicine

## 2017-11-09 ENCOUNTER — Encounter: Payer: Medicaid Other | Admitting: Pharmacist

## 2017-11-17 ENCOUNTER — Ambulatory Visit: Payer: Medicaid Other | Admitting: Physical Therapy

## 2017-11-23 ENCOUNTER — Other Ambulatory Visit: Payer: Self-pay

## 2017-11-23 ENCOUNTER — Ambulatory Visit: Payer: Medicaid Other | Attending: Neurology | Admitting: Physical Therapy

## 2017-11-23 ENCOUNTER — Encounter: Payer: Self-pay | Admitting: Physical Therapy

## 2017-11-23 DIAGNOSIS — M79604 Pain in right leg: Secondary | ICD-10-CM | POA: Insufficient documentation

## 2017-11-23 DIAGNOSIS — M545 Low back pain, unspecified: Secondary | ICD-10-CM

## 2017-11-23 DIAGNOSIS — M79605 Pain in left leg: Secondary | ICD-10-CM | POA: Diagnosis not present

## 2017-11-23 DIAGNOSIS — R2689 Other abnormalities of gait and mobility: Secondary | ICD-10-CM | POA: Diagnosis not present

## 2017-11-23 DIAGNOSIS — R2681 Unsteadiness on feet: Secondary | ICD-10-CM

## 2017-11-23 DIAGNOSIS — G8929 Other chronic pain: Secondary | ICD-10-CM

## 2017-11-24 ENCOUNTER — Telehealth: Payer: Self-pay | Admitting: Physical Therapy

## 2017-11-24 NOTE — Telephone Encounter (Signed)
Can you make sure that this patient of Dr. Durenda Age gets a follow-up with Dr. Wynetta Emery regarding the foot evaluation and orders that have been recommended by Jamey Reas, PT from the [facility]? Make sure that Dr. Wynetta Emery is also aware of the recommendations

## 2017-11-24 NOTE — Therapy (Signed)
[facility] [address] [place], [place], [address] Phone: [phone]   Fax:  [phone]  Physical Therapy Evaluation  Patient Details  Name: [name] MRN: [id number] Date of Birth: 1965/03/20 Referring Provider: Antony Blackbird, MD   Encounter Date: 11/23/2017  PT End of Session - 11/23/17 1359    Visit Number  1    Number of Visits  1    Authorization Type  Medicaid    PT Start Time  1324    PT Stop Time  1355    PT Time Calculation (min)  40 min    Activity Tolerance  Patient tolerated treatment well    Behavior During Therapy  [facility] for tasks assessed/performed       Past Medical History:  Diagnosis Date  . Anxiety 2002  . Chest tightness   . Depression 2001  . Diabetes type 1, uncontrolled ([place])    at age 31  . Diabetic neuropathy ([place])   . Essential hypertension 2015  . GERD (gastroesophageal reflux disease)    about age of 87  . Nausea and vomiting in adult   . Stroke ([place])   . Urinary frequency   . Yeast vaginitis     Past Surgical History:  Procedure Laterality Date  . ACHILLES TENDON SURGERY Left 03/10/2013   Procedure: LEFT CHOPART AMPUTATION/ LEFT TENDON ACHILLES [place];  Surgeon: Wylene Simmer, MD;  Location: [facility];  Service: Orthopedics;  Laterality: Left;  . AMPUTATION Left 06/15/2012   Procedure: AMPUTATION DIGIT;  Surgeon: Meredith Pel, MD;  Location: [facility];  Service: Orthopedics;  Laterality: Left;  Left great toe revision amputation  . ENDOMETRIAL ABLATION    . LAPAROSCOPIC CHOLECYSTECTOMY  01/30/2015   [facility]     There were no vitals filed for this visit.   Subjective Assessment - 11/23/17 1322    Subjective  This 52yo female was referred to PT for Gait Abnormality on 06/16/2017. Patient had a left partial foot (Chopart) amputation & achilles lengthening on 03/10/2013. She was hospitalized 04/07/17-04/10/17 for acute CVA.  She had a custom AFO with lace up boot in  2015 but she could not use it. She has used [employer] (consulting) since. She had wound in 2016 but none since.     Pertinent History  left partial foot amp, CVA, HTN, DM2, CKD3    Patient Stated Goals  To see if can get new prosthesis.     Currently in Pain?  Yes    Pain Score  9    in last week,  best 6/10, worst 10/10   Pain Location  Back    Pain Orientation  Lower;Mid;Lateral    Pain Descriptors / Indicators  Aching    Pain Type  Chronic pain    Pain Onset  More than a month ago    Pain Frequency  Constant    Aggravating Factors   walking or standing too long    Pain Relieving Factors  sitting down with feet propped up    Multiple Pain Sites  Yes    Pain Score  7   In last week, 6/10-10/10   Pain Location  Leg    Pain Orientation  Left    Pain Descriptors / Indicators  Aching;Cramping;Squeezing    Pain Type  Chronic pain    Pain Onset  More than a month ago    Pain Frequency  Constant    Aggravating Factors   walking & standing  Pain Relieving Factors  sitting with LEs propped up    Pain Score  5   In last week, best 5/10-8/10   Pain Location  Leg    Pain Orientation  Right    Pain Descriptors / Indicators  Aching;Cramping    Pain Type  Chronic pain    Pain Onset  More than a month ago    Pain Frequency  Constant    Aggravating Factors   walking & standing    Pain Relieving Factors  sitting with legs propped up         [facility] PT Assessment - 11/23/17 1315      Assessment   Medical Diagnosis  Partial foot amputation    Referring Provider  Antony Blackbird, MD    Onset Date/Surgical Date  06/16/16   MD referral to PT   Prior Therapy  1 PT visit in March 19 with CVA      Precautions   Precautions  Fall      Balance Screen   Has the patient fallen in the past 6 months  Yes    How many times?  3   weakness / buckling when LEs cramp,    Has the patient had a decrease in activity level because of a fear of falling?   Yes    Is the patient reluctant to leave their home because  of a fear of falling?   No      Home Environment   Living Environment  Private residence    Living Arrangements  Children    Type of [place] to enter    Entrance Stairs-Number of Steps  2    Entrance Stairs-Rails  None    Home Layout  One level      Prior Function   Level of Independence  Independent;Independent with household mobility without device;Independent with community mobility without device    Vocation  Unemployed   denied disability   Leisure  [place]       Posture/Postural Control   Posture/Postural Control  Postural limitations    Postural Limitations  Weight shift right      ROM / Strength   AROM / PROM / Strength  AROM;Strength      AROM   Overall AROM   Within functional limits for tasks performed      Strength   Overall Strength  Within functional limits for tasks performed    Overall Strength Comments  gross MMT UEs & LEs 5/5      Transfers   Transfers  Sit to Stand;Stand to Sit    Sit to Stand  6: Modified independent (Device/Increase time);Without upper extremity assist;From chair/3-in-1    Stand to Sit  6: Modified independent (Device/Increase time);Without upper extremity assist;To chair/3-in-1      Ambulation/Gait   Ambulation/Gait  Yes    Ambulation/Gait Assistance  6: Modified independent (Device/Increase time)    Ambulation/Gait Assistance Details  Assessed gait with left anterior loading carbon composite AFO:  pt's gait improved with upright posture, stance duration more equal, improved clearance LLE with less deviations to clear foot.  Pt reported much lighter than CAM walker that she uses.     Ambulation Distance (Feet)  300 Feet    Assistive device  None;Other (Comment)   CAM walker then assessed with anterior loading AFO   Ambulation Surface  Indoor;Level    Gait velocity  3.42 ft/sec with AFO  Prosthetics Assessment - 11/23/17 1315      Prosthetics   Residual limb condition   left partial foot  residual limb has dry skin, 3 areas of superficial abrasions without epidermis, decreased hair growth below knee indicating decreased circulation.                 Objective measurements completed on examination: See above findings.                           Plan - 11/23/17 1800    Clinical Impression Statement  This 53yo female underwent a left partial foot (Chopart) amputation in 2015 and CVA Feb. 2019.  She reports she was fit with partial foot prosthesis in 2015 that was thermoplastic AFO covered with leather lace-up boot. She could not use it so she has been using a CAM walker boot. She has back & BLE pain limiting function & mobility. Significant gait deviations are large factor in pain issues. PT assessed gait with Anterior loading carbon composite AFO in clinic. Gait improved significantly including more upright posture, stance duration equality, improved clearance without deviations and improved stablity. Patient reports she likes how it made her walk and was a lot lighter than CAM walker.  PT educated on recommendation for diabetic shoes with custom inserts (must be prescribed by MD treating her DM along with detailed foot evaluation).  Left custom foot orthosis with toe filler and attached to anterior loading carbon composite AFO with lateral strut & T-strap. Patient choose to have consult with [facility] to persue with recommendations noted. She is aware of need for MD prescription & assessment. Patient may need a few PT visits with prosthesis for high level functional training.     History and Personal Factors relevant to plan of care:  left partial foot amp, CVA, HTN, DM2, CKD3      Clinical Presentation  Stable    Clinical Decision Making  Low    Rehab Potential  Good    PT Frequency  One time visit    PT Next Visit Plan  eval only, see assessment for recommendations       Patient will benefit from skilled therapeutic intervention in order to  improve the following deficits and impairments:  Abnormal gait, Decreased activity tolerance, Decreased balance, Pain, Decreased skin integrity  Visit Diagnosis: Unsteadiness on feet  Other abnormalities of gait and mobility  Pain in left leg  Pain in right leg  Chronic midline low back pain without sciatica     Problem List Patient Active Problem List   Diagnosis Date Noted  . Cerebral thrombosis with cerebral infarction 04/08/2017  . Acute right-sided weakness 04/07/2017  . Slurred speech 04/07/2017  . Right sided weakness 04/07/2017  . Hyperhidrosis 09/01/2016  . Migraine with aura and without status migrainosus, not intractable 07/28/2016  . 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 CKD stage 3 due to type 2 diabetes mellitus ([place]) 06/27/2014  . Vitamin D insufficiency 05/08/2014  . Obesity (BMI 30.0-34.9) 05/08/2014  . Unilateral amputation of left foot ([place]) 05/08/2014  . Bursitis of left shoulder 02/14/2014  . Neck pain 01/03/2014  . Diabetes mellitus type 2, uncontrolled, with complications ([place]) 32/20/2542  . 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.23) 01/14/2013  . Insomnia 08/04/2012  . Diabetic neuropathy, painful ([place]) 08/04/2011  . Anxiety and depression 05/16/2010  . Essential hypertension, benign 11/01/2007  . Female stress incontinence 11/01/2007  .  TOBACCO DEPENDENCE 04/30/2006  . GASTROESOPHAGEAL REFLUX, NO ESOPHAGITIS 04/30/2006  . Irritable bowel syndrome 04/30/2006    Jamey Reas PT, DPT 11/24/2017, 2:39 PM  [facility] [address] [place], [place], [address] Phone: [phone]   Fax:  [phone]  Name: YICEL SHANNON MRN: [id number] Date of Birth: 1965/06/26

## 2017-11-24 NOTE — Telephone Encounter (Signed)
Dr. Chapman Martinez I had the privilege of evaluating [name] yesterday. Her left partial foot amputation has superficial abrasions from rubbing on CAM walker boot. She would benefit from diabetic shoes with custom inserts and left will need toe filler or partial foot prosthesis. Left partial foot prosthesis to include Anterior loading carbon composite AFO with lateral strut and T-strap to stabilize the ankle. She is scheduling appointment with you to get Foot Evaluation and documentation to justify & prescribe partial foot prosthesis incorporating AFO and diabetic shoes.  Thank you for referral. Please contact me with any questions Jamey Reas, PT, DPT PT Specializing in [place] 11/24/2017@ 12:30 PM Phone:  [phone]  Fax:  [phone] [facility] [address] [place], [place] [address]

## 2017-11-25 ENCOUNTER — Ambulatory Visit: Payer: Medicaid Other | Admitting: Podiatry

## 2017-11-26 NOTE — Telephone Encounter (Signed)
Pt  request to have f/u OV with Dr. Chapman Fitch.

## 2017-12-15 ENCOUNTER — Telehealth: Payer: Self-pay | Admitting: Family Medicine

## 2017-12-15 NOTE — Telephone Encounter (Signed)
Patient called because she wants to know when her paperwork will be ready to pick up please follow up with patient. She would like for her paperwork to be sent to [facility].

## 2017-12-16 NOTE — Telephone Encounter (Signed)
Called patient to see what paperwork she is needing. lvm to return call.

## 2017-12-16 NOTE — Telephone Encounter (Signed)
Patient returned call regarding paperwork.

## 2017-12-16 NOTE — Telephone Encounter (Signed)
Patient returned call and stated that she needed paperwork filled out to get a prosthetic by the dr and faxed back over to ortho office. Patient stated this was spoken on at last visit 3.5 weeks ago. CMA called ortho office to verify what paperwork, registrar stated their office doesn't work like that. The pcp is supposed to write a script/referral of what the pcp/patient are wanting done and a face sheet must be sent along with script from pcp. Once that is  completed the patient will be contacted for an appointment. Patient has an appointment 12/17/17 with pcp.   Can be faxed to  [phone]

## 2017-12-17 ENCOUNTER — Ambulatory Visit: Payer: Medicaid Other | Attending: Family Medicine | Admitting: Family Medicine

## 2017-12-17 ENCOUNTER — Other Ambulatory Visit: Payer: Self-pay

## 2017-12-17 ENCOUNTER — Encounter: Payer: Self-pay | Admitting: Family Medicine

## 2017-12-17 VITALS — BP 165/68 | HR 68 | Temp 98.8°F | Resp 18 | Ht 70.0 in | Wt 214.8 lb

## 2017-12-17 DIAGNOSIS — G8929 Other chronic pain: Secondary | ICD-10-CM

## 2017-12-17 DIAGNOSIS — F17200 Nicotine dependence, unspecified, uncomplicated: Secondary | ICD-10-CM | POA: Diagnosis not present

## 2017-12-17 DIAGNOSIS — Z833 Family history of diabetes mellitus: Secondary | ICD-10-CM | POA: Insufficient documentation

## 2017-12-17 DIAGNOSIS — M50322 Other cervical disc degeneration at C5-C6 level: Secondary | ICD-10-CM | POA: Insufficient documentation

## 2017-12-17 DIAGNOSIS — E1140 Type 2 diabetes mellitus with diabetic neuropathy, unspecified: Secondary | ICD-10-CM

## 2017-12-17 DIAGNOSIS — M545 Low back pain, unspecified: Secondary | ICD-10-CM

## 2017-12-17 DIAGNOSIS — E1040 Type 1 diabetes mellitus with diabetic neuropathy, unspecified: Secondary | ICD-10-CM | POA: Diagnosis not present

## 2017-12-17 DIAGNOSIS — R269 Unspecified abnormalities of gait and mobility: Secondary | ICD-10-CM | POA: Diagnosis not present

## 2017-12-17 DIAGNOSIS — R252 Cramp and spasm: Secondary | ICD-10-CM | POA: Diagnosis not present

## 2017-12-17 DIAGNOSIS — Z886 Allergy status to analgesic agent status: Secondary | ICD-10-CM | POA: Diagnosis not present

## 2017-12-17 DIAGNOSIS — I129 Hypertensive chronic kidney disease with stage 1 through stage 4 chronic kidney disease, or unspecified chronic kidney disease: Secondary | ICD-10-CM | POA: Insufficient documentation

## 2017-12-17 DIAGNOSIS — E1022 Type 1 diabetes mellitus with diabetic chronic kidney disease: Secondary | ICD-10-CM | POA: Diagnosis not present

## 2017-12-17 DIAGNOSIS — F17210 Nicotine dependence, cigarettes, uncomplicated: Secondary | ICD-10-CM | POA: Insufficient documentation

## 2017-12-17 DIAGNOSIS — Z8249 Family history of ischemic heart disease and other diseases of the circulatory system: Secondary | ICD-10-CM | POA: Insufficient documentation

## 2017-12-17 DIAGNOSIS — Z9889 Other specified postprocedural states: Secondary | ICD-10-CM | POA: Insufficient documentation

## 2017-12-17 DIAGNOSIS — N184 Chronic kidney disease, stage 4 (severe): Secondary | ICD-10-CM | POA: Insufficient documentation

## 2017-12-17 DIAGNOSIS — L84 Corns and callosities: Secondary | ICD-10-CM | POA: Diagnosis not present

## 2017-12-17 DIAGNOSIS — Z79899 Other long term (current) drug therapy: Secondary | ICD-10-CM | POA: Insufficient documentation

## 2017-12-17 DIAGNOSIS — Z794 Long term (current) use of insulin: Secondary | ICD-10-CM | POA: Insufficient documentation

## 2017-12-17 DIAGNOSIS — IMO0002 Reserved for concepts with insufficient information to code with codable children: Secondary | ICD-10-CM

## 2017-12-17 DIAGNOSIS — Z89432 Acquired absence of left foot: Secondary | ICD-10-CM | POA: Diagnosis not present

## 2017-12-17 DIAGNOSIS — Z91040 Latex allergy status: Secondary | ICD-10-CM | POA: Insufficient documentation

## 2017-12-17 DIAGNOSIS — E1165 Type 2 diabetes mellitus with hyperglycemia: Secondary | ICD-10-CM | POA: Diagnosis not present

## 2017-12-17 DIAGNOSIS — E118 Type 2 diabetes mellitus with unspecified complications: Secondary | ICD-10-CM | POA: Diagnosis not present

## 2017-12-17 LAB — GLUCOSE, POCT (MANUAL RESULT ENTRY): POC Glucose: 147 mg/dL — AB (ref 70–99)

## 2017-12-17 MED ORDER — CYCLOBENZAPRINE HCL 10 MG PO TABS: 10.0000 mg | ORAL_TABLET | Freq: Every day | ORAL | 3 refills | Status: DC

## 2017-12-17 MED ORDER — DULOXETINE HCL 60 MG PO CPEP: 60.0000 mg | ORAL_CAPSULE | Freq: Every day | ORAL | 3 refills | Status: DC

## 2017-12-17 NOTE — Progress Notes (Signed)
Subjective:    Patient ID: [name], female    DOB: 11-10-1965, 52 y.o.   MRN: [id number]  HPI 52 year old female with diabetes who was last seen in the office on 10/13/2017 and was diagnosed and treated for acute cystitis returns for refill of her Cymbalta to help with chronic back pain and diabetic neuropathy. Patient was also seen on 10/06/17 regarding her diabetes.   Patient also reports recent onset of muscle cramping in her lower extremities, especially on the left.  Patient reports the cramping sensation as a deep cramp that feels like it is down to the bone.  Patient states that she recently bought over-the-counter potassium pills which she is started taking to see if this helps with the cramping in her leg.  Patient would also like a prescription for Flexeril to help with the muscle cramping.  Patient also needs new prescription for left foot prosthesis.  Patient states that her abnormal gait secondary to her prior amputation of the left foot has been contributing to her issues with chronic back pain.  Patient states that when she went to physical therapy, they recommended that she receive a new prosthetic device.  Patient states however that she has been told that she needs a prescription for the prosthetic device however she has not been able to obtain this.  Patient states that she did drop off paperwork in order to obtain the new prosthetic device but so far she has not received any update that the prosthetic device is ready.  Patient reports that she missed her recent podiatry appointment and she knows that her toenails on her right foot are too long.  Patient with chronic numbness and pain in the right foot secondary to neuropathy.  Patient reports that the neuropathic pain is about a 8 on a 0-to-10 scale and patient's back pain ranges from 5-8 on a 0-to-10 scale with 10 being the worst pain possible.  Patient also reports that she has seen her vascular specialist within the past 12 months.   Patient states that she has been told that her blood circulation is fine.  Patient states that she does continue to smoke but is down to about half a pack of cigarettes per day.  Patient also reports that she has seen her nephrologist regarding her chronic kidney disease.  Patient states that her blood sugars have been controlled with her home fasting blood sugars generally being in the low 100s and sometimes in the 80s or 90s.  Patient has had a few blood sugars in the 70s overnight and had to get up and eat something. Past Medical History:  Diagnosis Date  . Anxiety 2002  . Chest tightness   . Depression 2001  . Diabetes type 1, uncontrolled ([place])    at age 52  . Diabetic neuropathy ([place])   . Essential hypertension 2015  . GERD (gastroesophageal reflux disease)    about age of 81  . Nausea and vomiting in adult   . Stroke ([place])   . Urinary frequency   . Yeast vaginitis    Past Surgical History:  Procedure Laterality Date  . ACHILLES TENDON SURGERY Left 03/10/2013   Procedure: LEFT CHOPART AMPUTATION/ LEFT TENDON ACHILLES [place];  Surgeon: Wylene Simmer, MD;  Location: [facility];  Service: Orthopedics;  Laterality: Left;  . AMPUTATION Left 06/15/2012   Procedure: AMPUTATION DIGIT;  Surgeon: Meredith Pel, MD;  Location: [facility];  Service: Orthopedics;  Laterality: Left;  Left great toe revision amputation  .  ENDOMETRIAL ABLATION    . LAPAROSCOPIC CHOLECYSTECTOMY  01/30/2015   [facility]    Family History  Problem Relation Age of Onset  . Diabetes Mother   . Heart attack Mother   . Heart disease Mother        before age 25  . Hypertension Mother   . Hyperlipidemia Mother   . Diabetes Father   . Heart attack Father   . Heart disease Father        before age 49  . Diabetes Sister   . Hypertension Sister    Social History   Tobacco Use  . Smoking status: Light Tobacco Smoker    Packs/day: 0.25    Years: 22.00    Pack years: 5.50    Types: Cigarettes  . Smokeless  tobacco: Never Used  Substance Use Topics  . Alcohol use: Yes    Alcohol/week: 0.0 standard drinks    Comment: sociial  . Drug use: No   Allergies  Allergen Reactions  . Lidocaine Itching  . Latex Rash   Review of Systems  Constitutional: Positive for fatigue. Negative for appetite change, chills, diaphoresis and fever.  HENT: Negative for sore throat and trouble swallowing.   Respiratory: Negative for cough and shortness of breath.   Cardiovascular: Negative for chest pain, palpitations and leg swelling.  Gastrointestinal: Negative for abdominal pain and diarrhea.  Endocrine: Negative for polydipsia, polyphagia and polyuria.  Genitourinary: Negative for dysuria and frequency.  Musculoskeletal: Positive for arthralgias, back pain, gait problem and myalgias. Negative for joint swelling.  Neurological: Positive for numbness. Negative for dizziness and headaches.       Objective:   Physical Exam BP (!) 165/68   Pulse 68   Temp 98.8 F (37.1 C) (Oral)   Resp 18   Ht '5\' 10"'  (1.778 m)   Wt 214 lb 12.8 oz (97.4 kg)   SpO2 96%   BMI 30.82 kg/m Vital signs and nurse's notes reviewed General-well-nourished, well-developed overweight older female in no acute distress.  Patient is wearing a cam boot to the left foot/ankle area which she removed for her exam Lungs-clear to auscultation bilaterally Cardiovascular-regular rate and rhythm Abdomen-mild truncal obesity, soft and nontender Back-no CVA tenderness, patient with bilateral lumbar paraspinous spasm and mild lumbosacral discomfort to palpation Extremities-no edema Diabetic foot exam- patient with nonpalpable dorsalis pedis and posterior tibial pulses on the right foot.  Patient with hammertoe deformities of the right foot.  Patient with callus on the side of the right great toe as well as all of the foot and on the heel.  Patient with abnormal/no monofilament sensation on 10 out of 10 areas tested on the right foot.  Patient is  status post amputation of the left foot.  Patient's amputation site has no active skin breakdown but does have dry skin with some flaking/irritation of the skin on the posterior stump site      Assessment & Plan:  1. Diabetes mellitus type 2, uncontrolled, with complications ([place]) Patient reports that her blood sugar control has improved and her home blood sugars are generally in the 90s to low 100s fasting.  Patient did have recent hemoglobin A1c on 10/06/2017 which had improved to 7.0 and her prior hemoglobin A1c 8 months ago was 9.8.  Patient will continue her current regimen of medications for the treatment of her diabetes.  Patient is currently on insulin.  However patient also with chronic kidney disease and as her kidney disease worsens, patient will likely  begin to experience more hypoglycemic episodes.  Patient has been encouraged several times to schedule regular follow-up with her nephrologist.  Patient also with issues of chronic back pain which have been worsened due to abnormal gait status post left foot amputation.  Patient's physical therapy note was reviewed and it was suggested that patient would benefit from a new prosthetic device to the left foot to help with improvement of gait which should help also well with her back pain.  Patient also with hammertoe deformities, nonpalpable peripheral pulses on the right foot as well as pre-ulcerative calluses and will need diabetic shoes with custom inserts for the right foot as well.  Patient would like to have her prescription sent to [facility].  Patient will also be referred back to physical therapy for gait retraining status post fitting of new prosthetic. - Glucose (CBG) - Basic Metabolic Panel - Glucose (CBG)  2. Muscle cramping Patient reports that she has been experiencing some muscle cramping in her lower extremities.  Patient reports that she has had recent evaluation by vein and vascular specialist but I could not refine this  report in the chart.  Patient does continue to smoke and has known peripheral arterial disease.  Patient however also has known chronic kidney disease with last creatinine of 3.18 on 10/13/2017 and I could not find any evidence in the chart the patient followed up with nephrology as instructed.  Patient also states that she recently began taking an over-the-counter potassium supplement.  Patient will have a BMP to check her electrolyte status in light of her new onset of muscle cramping.  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 - Basic Metabolic Panel - cyclobenzaprine (FLEXERIL) 10 MG tablet; Take 1 tablet (10 mg total) by mouth at bedtime. As needed for muscle spasm  Dispense: 30 tablet; Refill: 3  3. Diabetic neuropathy, painful ([place]) Patient provided with refills of duloxetine for treatment of chronic back pain and diabetic neuropathy - DULoxetine (CYMBALTA) 60 MG capsule; Take 1 capsule (60 mg total) by mouth daily.  Dispense: 90 capsule; Refill: 3  4. Status post Chopart amputation of left foot ([place]) On review of chart, patient with hospital discharge summary from hospital admission 03/10/2013 through 03/16/2013 reviewed.  Patient was admitted secondary to osteomyelitis of the left foot along with diabetes and tobacco use.  On 03/10/2013, patient had left Chopartamputation and heel cord tenotomy of the left foot.  Order will be sent to [facility] clinic for patient to obtain a carbon composite dynamic response AFO with anterior plate lateral strut with T-strap as per the recommendation of physical therapy to help improve patient's gait and decreased chronic back pain associated with abnormal gait.  5. Chronic low back pain Patient reports a history of lumbar degenerative disc disease.  Patient does have cervical  spine x-ray and chart with evidence of degenerative disc disease at C5-6 and C6-7.  Patient states that she has had imaging of her thoracic and cervical spine as well but cannot recall where these were done.  Patient is given new prescription for the Cymbalta to help with her chronic low back pain and patient is aware that she should not use nonsteroidal anti-inflammatories due to her severe chronic kidney disease.  Patient may have to have discontinuation of the Cymbalta as medication can also have nonsteroidal  anti-inflammatory type effects.  6.Gait abnormality Patient's notes from physical therapy were reviewed at today's visit and it has been suggested that patient have a new AFO on the left due to her history of amputation and patient with an abnormal gait that is likely also contributing to her chronic low back pain.  Patient will have prescription for new prosthetic sent to the [place] clinic and new referral to physical therapy for gait retraining when she has received a new prosthesis  7. Tobacco dependence Patient with known peripheral arterial disease and diabetes and it was again reinforced to the patient that she needs to stop smoking completely.  Patient was advised that help is available when she is ready to stop smoking.  Patient expressed at today's visit that she is not yet ready to attempt complete smoking cessation.  An After Visit Summary was printed and given to the patient. Allergies as of 12/17/2017      Reactions   Lidocaine Itching   Latex Rash      Medication List        Accurate as of 12/17/17  6:25 PM. Always use your most recent med list.          ACCU-CHEK AVIVA PLUS w/Device Kit CHECK BLOOD SUGARS THREE TIMES A DAY.  DX:250.03   acetaminophen-codeine 300-30 MG tablet Commonly known as:  TYLENOL #3 Take 1 tablet by mouth every 8 (eight) hours as needed for moderate pain.   alosetron 0.5 MG tablet Commonly known as:  LOTRONEX Take 0.5 mg by mouth 2 (two)  times daily.   aluminum chloride 20 % external solution Commonly known as:  DRYSOL Apply topically at bedtime.   aspirin 81 MG EC tablet Take 1 tablet (81 mg total) by mouth daily.   atorvastatin 40 MG tablet Commonly known as:  LIPITOR Take 1 tablet (40 mg total) by mouth daily at 6 PM.   calcium citrate-vitamin D 500-400 MG-UNIT chewable tablet Chew 1 tablet by mouth 2 (two) times daily.   [employer] VIT D 5000 HIGH-POTENCY 5000 units capsule Generic drug:  Cholecalciferol Take 5,000 Units by mouth daily.   cyclobenzaprine 10 MG tablet Commonly known as:  FLEXERIL Take 1 tablet (10 mg total) by mouth at bedtime. As needed for muscle spasm   DEXILANT 60 MG capsule Generic drug:  dexlansoprazole Take 60 mg by mouth daily.   diphenoxylate-atropine 2.5-0.025 MG tablet Commonly known as:  LOMOTIL TAKE 2 TABLETS BY MOUTH THREE TIMES DAILY BEFORE MEAL(S) PLEASE  TAKE  30  MINUTES  PRIOR  TO  MEAL   DULoxetine 60 MG capsule Commonly known as:  CYMBALTA Take 1 capsule (60 mg total) by mouth daily.   fluticasone 50 MCG/ACT nasal spray Commonly known as:  FLONASE Place 2 sprays into both nostrils daily.   glucose blood test strip USE TO CHECK BLOOD SUGAR BEFORE MEALS AND AT BEDTIME dx code E10.65   hydrALAZINE 50 MG tablet Commonly known as:  APRESOLINE Take 1 tablet (50 mg total) by mouth 3 (three) times daily.   Insulin Glargine 100 UNIT/ML Solostar Pen Commonly known as:  LANTUS INJECT 55 UNITS SUBCUTANEOUSLY ONCE DAILY   insulin lispro 100 UNIT/ML KiwkPen Commonly known as:  HUMALOG Inject 0.13 mLs (13 Units total) into the skin 3 (three) times daily.   promethazine 25 MG tablet Commonly known as:  PHENERGAN Take 1 tablet (25 mg total) by mouth every 8 (eight) hours as needed for nausea or vomiting.   sitaGLIPtin 50 MG tablet Commonly  known as:  JANUVIA Take 1 tablet (50 mg total) by mouth daily.   topiramate 25 MG tablet Commonly known as:  TOPAMAX Take 1 tablet  (25 mg total) by mouth 2 (two) times daily.   zolpidem 5 MG tablet Commonly known as:  AMBIEN TAKE ONE TABLET BY MOUTH ONCE DAILY AT BEDTIME AS NEEDED FOR SLEEP     Return in about 2 months (around 02/16/2018).

## 2017-12-17 NOTE — Progress Notes (Signed)
Pain: legs, 8, nueropathy

## 2017-12-17 NOTE — Telephone Encounter (Signed)
Okay, I will discuss with patient when she comes in. I believe that at her most recent visits she was post-op and had her foot bandaged. I will need to complete a foot exam so that her prosthetic can be ordered. The visit note with exam documentation will need to be sent/fax along with the order for the prosthesis

## 2017-12-18 LAB — BASIC METABOLIC PANEL WITH GFR
BUN/Creatinine Ratio: 13 (ref 9–23)
BUN: 35 mg/dL — ABNORMAL HIGH (ref 6–24)
CO2: 21 mmol/L (ref 20–29)
Calcium: 8.9 mg/dL (ref 8.7–10.2)
Chloride: 105 mmol/L (ref 96–106)
Creatinine, Ser: 2.67 mg/dL — ABNORMAL HIGH (ref 0.57–1.00)
GFR calc Af Amer: 23 mL/min/1.73 — ABNORMAL LOW
GFR calc non Af Amer: 20 mL/min/1.73 — ABNORMAL LOW
Glucose: 81 mg/dL (ref 65–99)
Potassium: 4.9 mmol/L (ref 3.5–5.2)
Sodium: 143 mmol/L (ref 134–144)

## 2017-12-18 NOTE — Telephone Encounter (Signed)
Requested paperwork was faxed to [facility] therapy 12/18/17.

## 2017-12-25 ENCOUNTER — Encounter: Payer: Self-pay | Admitting: Family Medicine

## 2017-12-25 DIAGNOSIS — Z89432 Acquired absence of left foot: Secondary | ICD-10-CM | POA: Insufficient documentation

## 2017-12-30 ENCOUNTER — Telehealth: Payer: Self-pay | Admitting: Family Medicine

## 2017-12-30 NOTE — Telephone Encounter (Signed)
Resent paperwork with fax transmission show ok delivery, original fax was sent 12/25/17 and was refaxed 12/30/17.

## 2017-12-30 NOTE — Telephone Encounter (Signed)
[name] with [facility] called because she says she did not receive the notes for the visit with the release. Please follow up.

## 2017-12-31 DIAGNOSIS — R32 Unspecified urinary incontinence: Secondary | ICD-10-CM | POA: Diagnosis not present

## 2018-01-07 ENCOUNTER — Ambulatory Visit: Payer: Medicaid Other | Admitting: Internal Medicine

## 2018-01-11 ENCOUNTER — Telehealth: Payer: Self-pay | Admitting: Family Medicine

## 2018-01-11 NOTE — Telephone Encounter (Signed)
Jasmine from [facility] called to request an update on new paperwork faxed over on 01/04/2018, please follow up as soon as possible

## 2018-01-13 NOTE — Telephone Encounter (Signed)
Sharyn Lull from [facility] called to speak with Carilyn Goodpasture because she has not received the paperwork needed, please follow up  [phone]

## 2018-01-14 NOTE — Telephone Encounter (Signed)
Sharyn Lull from [facility] called again requesting to speak with Carilyn Goodpasture, Nurse was not available  please follow up  [phone]

## 2018-01-18 NOTE — Telephone Encounter (Signed)
Received faxed documents from fax.Waiting for PCP to address paperwork.

## 2018-01-18 NOTE — Telephone Encounter (Signed)
Paperwork completed. Please return to [facility] by fax then call to confirm that they have received

## 2018-01-19 NOTE — Telephone Encounter (Signed)
Paperwork faxed and scanned to [facility] today.

## 2018-02-03 ENCOUNTER — Ambulatory Visit: Payer: Medicaid Other | Admitting: Podiatry

## 2018-02-15 ENCOUNTER — Ambulatory Visit: Payer: Medicaid Other | Admitting: Family Medicine

## 2018-03-08 ENCOUNTER — Telehealth: Payer: Self-pay

## 2018-03-08 DIAGNOSIS — S98022A Partial traumatic amputation of left foot at ankle level, initial encounter: Secondary | ICD-10-CM | POA: Diagnosis not present

## 2018-03-08 DIAGNOSIS — E1165 Type 2 diabetes mellitus with hyperglycemia: Secondary | ICD-10-CM | POA: Diagnosis not present

## 2018-03-08 DIAGNOSIS — E1142 Type 2 diabetes mellitus with diabetic polyneuropathy: Secondary | ICD-10-CM | POA: Diagnosis not present

## 2018-03-08 DIAGNOSIS — Z89432 Acquired absence of left foot: Secondary | ICD-10-CM | POA: Diagnosis not present

## 2018-03-08 NOTE — Telephone Encounter (Signed)
Orders for incontinence supplies faxed to [facility].

## 2018-03-10 DIAGNOSIS — R32 Unspecified urinary incontinence: Secondary | ICD-10-CM | POA: Diagnosis not present

## 2018-04-19 DIAGNOSIS — R197 Diarrhea, unspecified: Secondary | ICD-10-CM | POA: Diagnosis not present

## 2018-04-19 DIAGNOSIS — K580 Irritable bowel syndrome with diarrhea: Secondary | ICD-10-CM | POA: Diagnosis not present

## 2018-04-21 DIAGNOSIS — R32 Unspecified urinary incontinence: Secondary | ICD-10-CM | POA: Diagnosis not present

## 2018-04-29 ENCOUNTER — Telehealth: Payer: Self-pay | Admitting: Family Medicine

## 2018-04-29 NOTE — Telephone Encounter (Signed)
Patient needs to schedule and office visit in follow-up of her diabetes and pain medication can be discussed at that visit

## 2018-04-29 NOTE — Telephone Encounter (Signed)
1) Medication(s) Requested (by name): Tylenol #3 Flexeril  2) Pharmacy of Choice:  [facility] on [address]

## 2018-04-30 NOTE — Telephone Encounter (Signed)
Per PCP patient needs to be scheduled for an OV for refills.

## 2018-05-10 ENCOUNTER — Encounter: Payer: Self-pay | Admitting: Family Medicine

## 2018-05-10 ENCOUNTER — Ambulatory Visit: Payer: Medicaid Other | Attending: Family Medicine | Admitting: Family Medicine

## 2018-05-10 ENCOUNTER — Other Ambulatory Visit: Payer: Self-pay

## 2018-05-10 VITALS — BP 198/80 | HR 65 | Temp 98.5°F | Ht 70.0 in | Wt 216.4 lb

## 2018-05-10 DIAGNOSIS — E118 Type 2 diabetes mellitus with unspecified complications: Secondary | ICD-10-CM

## 2018-05-10 DIAGNOSIS — K588 Other irritable bowel syndrome: Secondary | ICD-10-CM | POA: Diagnosis not present

## 2018-05-10 DIAGNOSIS — K219 Gastro-esophageal reflux disease without esophagitis: Secondary | ICD-10-CM | POA: Diagnosis not present

## 2018-05-10 DIAGNOSIS — E1140 Type 2 diabetes mellitus with diabetic neuropathy, unspecified: Secondary | ICD-10-CM

## 2018-05-10 DIAGNOSIS — Z833 Family history of diabetes mellitus: Secondary | ICD-10-CM | POA: Insufficient documentation

## 2018-05-10 DIAGNOSIS — I10 Essential (primary) hypertension: Secondary | ICD-10-CM | POA: Diagnosis not present

## 2018-05-10 DIAGNOSIS — IMO0002 Reserved for concepts with insufficient information to code with codable children: Secondary | ICD-10-CM

## 2018-05-10 DIAGNOSIS — Z91040 Latex allergy status: Secondary | ICD-10-CM | POA: Diagnosis not present

## 2018-05-10 DIAGNOSIS — Z884 Allergy status to anesthetic agent status: Secondary | ICD-10-CM | POA: Diagnosis not present

## 2018-05-10 DIAGNOSIS — Z8249 Family history of ischemic heart disease and other diseases of the circulatory system: Secondary | ICD-10-CM | POA: Insufficient documentation

## 2018-05-10 DIAGNOSIS — R252 Cramp and spasm: Secondary | ICD-10-CM | POA: Diagnosis not present

## 2018-05-10 DIAGNOSIS — Z7982 Long term (current) use of aspirin: Secondary | ICD-10-CM | POA: Diagnosis not present

## 2018-05-10 DIAGNOSIS — K589 Irritable bowel syndrome without diarrhea: Secondary | ICD-10-CM | POA: Diagnosis not present

## 2018-05-10 DIAGNOSIS — E1165 Type 2 diabetes mellitus with hyperglycemia: Secondary | ICD-10-CM | POA: Diagnosis not present

## 2018-05-10 DIAGNOSIS — G4700 Insomnia, unspecified: Secondary | ICD-10-CM

## 2018-05-10 DIAGNOSIS — Z79899 Other long term (current) drug therapy: Secondary | ICD-10-CM | POA: Insufficient documentation

## 2018-05-10 DIAGNOSIS — Z89512 Acquired absence of left leg below knee: Secondary | ICD-10-CM | POA: Diagnosis not present

## 2018-05-10 DIAGNOSIS — Z8673 Personal history of transient ischemic attack (TIA), and cerebral infarction without residual deficits: Secondary | ICD-10-CM | POA: Insufficient documentation

## 2018-05-10 DIAGNOSIS — L308 Other specified dermatitis: Secondary | ICD-10-CM | POA: Diagnosis not present

## 2018-05-10 DIAGNOSIS — Z794 Long term (current) use of insulin: Secondary | ICD-10-CM | POA: Diagnosis not present

## 2018-05-10 LAB — POCT GLYCOSYLATED HEMOGLOBIN (HGB A1C): HbA1c, POC (controlled diabetic range): 7.6 % — AB (ref 0.0–7.0)

## 2018-05-10 LAB — GLUCOSE, POCT (MANUAL RESULT ENTRY): POC Glucose: 124 mg/dl — AB (ref 70–99)

## 2018-05-10 MED ORDER — ACETAMINOPHEN-CODEINE #3 300-30 MG PO TABS: 1.0000 | ORAL_TABLET | Freq: Three times a day (TID) | ORAL | 0 refills | Status: DC | PRN

## 2018-05-10 MED ORDER — ATORVASTATIN CALCIUM 40 MG PO TABS: 40.0000 mg | ORAL_TABLET | Freq: Every day | ORAL | 3 refills | Status: DC

## 2018-05-10 MED ORDER — CYCLOBENZAPRINE HCL 10 MG PO TABS: 10.0000 mg | ORAL_TABLET | Freq: Every day | ORAL | 3 refills | Status: DC

## 2018-05-10 MED ORDER — DIPHENOXYLATE-ATROPINE 2.5-0.025 MG PO TABS: 2.0000 | ORAL_TABLET | Freq: Two times a day (BID) | ORAL | 0 refills | Status: DC | PRN

## 2018-05-10 MED ORDER — ZOLPIDEM TARTRATE 5 MG PO TABS: ORAL_TABLET | ORAL | 2 refills | Status: DC

## 2018-05-10 NOTE — Progress Notes (Signed)
Subjective:  Patient ID: [name], female    DOB: Jan 07, 1966  Age: 53 y.o. MRN: [id number]  CC: Diabetes and Leg Pain   HPI [name] is a 53 year old female with a history of type 2 diabetes mellitus (A1c 7.6), left BKA, diabetic neuropathy, insomnia hypertension, IBS here for refills of a couple of her medications. Her blood pressure is significantly elevated and she is yet to take her antihypertensive this morning. She is requesting refill of Tylenol 3 which she uses for diabetic neuropathy and phantom pain in her left lower extremity.  She states her pain control is inadequate as she receives only 21tabs of her Tylenol 3 pills.  She is also on Cymbalta and Flexeril. Her A1c is 7.6 which has trended up from 7.0 previously and she endorses compliance with her medications and no regimen change today. She has chronic diarrhea for which she uses Lomotil; appointment with GI comes up later this month.  Past Medical History:  Diagnosis Date  . Anxiety 2002  . Chest tightness   . Depression 2001  . Diabetes type 1, uncontrolled ([place])    at age 76  . Diabetic neuropathy ([place])   . Essential hypertension 2015  . GERD (gastroesophageal reflux disease)    about age of 89  . Nausea and vomiting in adult   . Stroke ([place])   . Urinary frequency   . Yeast vaginitis     Past Surgical History:  Procedure Laterality Date  . ACHILLES TENDON SURGERY Left 03/10/2013   Procedure: LEFT CHOPART AMPUTATION/ LEFT TENDON ACHILLES Gravity;  Surgeon: Wylene Simmer, MD;  Location: [facility];  Service: Orthopedics;  Laterality: Left;  . AMPUTATION Left 06/15/2012   Procedure: AMPUTATION DIGIT;  Surgeon: Meredith Pel, MD;  Location: [facility];  Service: Orthopedics;  Laterality: Left;  Left great toe revision amputation  . ENDOMETRIAL ABLATION    . LAPAROSCOPIC CHOLECYSTECTOMY  01/30/2015   [facility] day surgery     Family History  Problem Relation Age of Onset  . Diabetes Mother   . Heart attack  Mother   . Heart disease Mother        before age 60  . Hypertension Mother   . Hyperlipidemia Mother   . Diabetes Father   . Heart attack Father   . Heart disease Father        before age 87  . Diabetes Sister   . Hypertension Sister     Allergies  Allergen Reactions  . Lidocaine Itching  . Latex Rash    Outpatient Medications Prior to Visit  Medication Sig Dispense Refill  . alosetron (LOTRONEX) 0.5 MG tablet Take 0.5 mg by mouth 2 (two) times daily.  6  . aspirin EC 81 MG EC tablet Take 1 tablet (81 mg total) by mouth daily. 30 tablet 0  . Blood Glucose Monitoring Suppl (ACCU-CHEK AVIVA PLUS) w/Device KIT CHECK BLOOD SUGARS THREE TIMES A DAY.  [id number] 1 kit 0  . calcium citrate-vitamin D 500-400 MG-UNIT chewable tablet Chew 1 tablet by mouth 2 (two) times daily. 180 tablet 1  . DEXILANT 60 MG capsule Take 60 mg by mouth daily.  6  . DULoxetine (CYMBALTA) 60 MG capsule Take 1 capsule (60 mg total) by mouth daily. 90 capsule 3  . fluticasone (FLONASE) 50 MCG/ACT nasal spray Place 2 sprays into both nostrils daily. (Patient taking differently: Place 2 sprays into both nostrils daily as needed. ) 16 g 6  . glucose blood (ACCU-CHEK  AVIVA PLUS) test strip USE TO CHECK BLOOD SUGAR BEFORE MEALS AND AT BEDTIME dx code E10.65 125 each 6  . hydrALAZINE (APRESOLINE) 50 MG tablet Take 1 tablet (50 mg total) by mouth 3 (three) times daily. 90 tablet 11  . Insulin Glargine (LANTUS SOLOSTAR) 100 UNIT/ML Solostar Pen INJECT 55 UNITS SUBCUTANEOUSLY ONCE DAILY (Patient taking differently: Inject 55-60 Units into the skin at bedtime. Per pt based on sliding scale) 15 mL 3  . insulin lispro (HUMALOG KWIKPEN) 100 UNIT/ML KiwkPen Inject 0.13 mLs (13 Units total) into the skin 3 (three) times daily. 15 mL 6  . promethazine (PHENERGAN) 25 MG tablet Take 1 tablet (25 mg total) by mouth every 8 (eight) hours as needed for nausea or vomiting. 20 tablet 0  . cyclobenzaprine (FLEXERIL) 10 MG tablet Take 1  tablet (10 mg total) by mouth at bedtime. As needed for muscle spasm 30 tablet 3  . diphenoxylate-atropine (LOMOTIL) 2.5-0.025 MG tablet TAKE 2 TABLETS BY MOUTH THREE TIMES DAILY BEFORE MEAL(S) PLEASE  TAKE  30  MINUTES  PRIOR  TO  MEAL 180 tablet 0  . zolpidem (AMBIEN) 5 MG tablet TAKE ONE TABLET BY MOUTH ONCE DAILY AT BEDTIME AS NEEDED FOR SLEEP 30 tablet 2  . aluminum chloride (DRYSOL) 20 % external solution Apply topically at bedtime. (Patient not taking: Reported on 12/17/2017) 35 mL 5  . Cholecalciferol ([employer] VIT D 5000 HIGH-POTENCY) 5000 units capsule Take 5,000 Units by mouth daily.    Marland Kitchen topiramate (TOPAMAX) 25 MG tablet Take 1 tablet (25 mg total) by mouth 2 (two) times daily. (Patient not taking: Reported on 05/10/2018) 120 tablet 3  . acetaminophen-codeine (TYLENOL #3) 300-30 MG tablet Take 1 tablet by mouth every 8 (eight) hours as needed for moderate pain. 30 tablet 3  . atorvastatin (LIPITOR) 40 MG tablet Take 1 tablet (40 mg total) by mouth daily at 6 PM. (Patient not taking: Reported on 06/25/2017) 30 tablet 0  . sitaGLIPtin (JANUVIA) 50 MG tablet Take 1 tablet (50 mg total) by mouth daily. (Patient not taking: Reported on 05/10/2018) 30 tablet 5   No facility-administered medications prior to visit.      ROS Review of Systems  Constitutional: Negative for activity change, appetite change and fatigue.  HENT: Negative for congestion, sinus pressure and sore throat.   Eyes: Negative for visual disturbance.  Respiratory: Negative for cough, chest tightness, shortness of breath and wheezing.   Cardiovascular: Negative for chest pain and palpitations.  Gastrointestinal: Positive for abdominal pain and diarrhea. Negative for abdominal distention and constipation.  Endocrine: Negative for polydipsia.  Genitourinary: Negative for dysuria and frequency.  Musculoskeletal: Negative for arthralgias and back pain.  Skin: Negative for rash.  Neurological: Positive for numbness. Negative for  tremors and light-headedness.  Hematological: Does not bruise/bleed easily.  Psychiatric/Behavioral: Negative for agitation and behavioral problems.    Objective:  BP (!) 198/80   Pulse 65   Temp 98.5 F (36.9 C) (Oral)   Ht '5\' 10"'  (1.778 m)   Wt 216 lb 6.4 oz (98.2 kg)   SpO2 97%   BMI 31.05 kg/m   BP/Weight 05/10/2018 12/17/2017 7/90/2409  Systolic BP 735 329 924  Diastolic BP 80 68 75  Wt. (Lbs) 216.4 214.8 -  BMI 31.05 30.82 -      Physical Exam Constitutional:      Appearance: She is well-developed.  Cardiovascular:     Rate and Rhythm: Normal rate.     Heart sounds: Normal heart sounds.  No murmur.  Pulmonary:     Effort: Pulmonary effort is normal.     Breath sounds: Normal breath sounds. No wheezing or rales.  Chest:     Chest wall: No tenderness.  Abdominal:     General: Bowel sounds are normal. There is no distension.     Palpations: Abdomen is soft. There is no mass.     Tenderness: There is no abdominal tenderness.  Musculoskeletal: Normal range of motion.     Comments: L BKA  Neurological:     Mental Status: She is alert and oriented to person, place, and time.     CMP Latest Ref Rng & Units 12/17/2017 10/13/2017 10/06/2017  Glucose 65 - 99 mg/dL 81 196(H) 112(H)  BUN 6 - 24 mg/dL 35(H) 39(H) 40(H)  Creatinine 0.57 - 1.00 mg/dL 2.67(H) 3.18(H) 3.08(H)  Sodium 134 - 144 mmol/L 143 139 143  Potassium 3.5 - 5.2 mmol/L 4.9 5.0 5.2  Chloride 96 - 106 mmol/L 105 105 105  CO2 20 - 29 mmol/L 21 19(L) 22  Calcium 8.7 - 10.2 mg/dL 8.9 9.3 9.7  Total Protein 6.0 - 8.5 g/dL - - 7.4  Total Bilirubin 0.0 - 1.2 mg/dL - - <0.2  Alkaline Phos 39 - 117 IU/L - - 116  AST 0 - 40 IU/L - - 12  ALT 0 - 32 IU/L - - 9    Lipid Panel     Component Value Date/Time   CHOL 205 (H) 04/08/2017 0154   CHOL 275 (H) 02/16/2017 1608   TRIG 389 (H) 04/08/2017 0154   HDL 26 (L) 04/08/2017 0154   HDL 33 (L) 02/16/2017 1608   CHOLHDL 7.9 04/08/2017 0154   VLDL 78 (H)  04/08/2017 0154   LDLCALC 101 (H) 04/08/2017 0154   LDLCALC Comment 02/16/2017 1608   LDLDIRECT 101.0 03/29/2014 1259    CBC    Component Value Date/Time   WBC 15.0 (H) 10/06/2017 1507   WBC 8.4 04/08/2017 0154   RBC 4.49 10/06/2017 1507   RBC 4.32 04/08/2017 0154   HGB 12.5 10/06/2017 1507   HCT 38.3 10/06/2017 1507   PLT 368 10/06/2017 1507   MCV 85 10/06/2017 1507   MCH 27.8 10/06/2017 1507   MCH 27.5 04/08/2017 0154   MCHC 32.6 10/06/2017 1507   MCHC 31.9 04/08/2017 0154   RDW 14.9 10/06/2017 1507   LYMPHSABS 3.7 (H) 10/06/2017 1507   MONOABS 0.7 04/07/2017 1148   EOSABS 0.3 10/06/2017 1507   BASOSABS 0.1 10/06/2017 1507    Lab Results  Component Value Date   HGBA1C 7.6 (A) 05/10/2018    Assessment & Plan:   1. Diabetes mellitus type 2, uncontrolled, with complications ([place]) Not at goal with A1c of 7.6 which has trended up from 7.0 previously Strict adherence to diabetic diet and lifestyle modifications and if further trending up she may need adjustment of her regimen at her visit with her PCP. She has not been taking her Lipitor Discussed cardiovascular benefits of statin and we have re -initiated this Counseled on Diabetic diet, my plate method, 235 minutes of moderate intensity exercise/week Keep blood sugar logs with fasting goals of 80-120 mg/dl, random of less than 180 and in the event of sugars less than 60 mg/dl or greater than 400 mg/dl please notify the clinic ASAP. It is recommended that you undergo annual eye exams and annual foot exams. Pneumonia vaccine is recommended. - POCT glucose (manual entry) - POCT glycosylated hemoglobin (Hb A1C)  2. Muscle  cramping - cyclobenzaprine (FLEXERIL) 10 MG tablet; Take 1 tablet (10 mg total) by mouth at bedtime. As needed for muscle spasm  Dispense: 30 tablet; Refill: 3  3. S/P BKA (below knee amputation) unilateral, left ([facility]) Currently on Cymbalta and Tylenol 3 as needed Completed form for handicap  placard  4. Insomnia, unspecified type Stable - zolpidem (AMBIEN) 5 MG tablet; TAKE ONE TABLET BY MOUTH ONCE DAILY AT BEDTIME AS NEEDED FOR SLEEP  Dispense: 30 tablet; Refill: 2  5. Diabetic neuropathy, painful ([place]) Uncontrolled on current regimen Patient advised to return to discuss pain management with PCP - acetaminophen-codeine (TYLENOL #3) 300-30 MG tablet; Take 1 tablet by mouth every 8 (eight) hours as needed for up to 30 days for moderate pain.  Dispense: 30 tablet; Refill: 0  6. Other irritable bowel syndrome Has upcoming appointment with GI at the end of the month - diphenoxylate-atropine (LOMOTIL) 2.5-0.025 MG tablet; Take 2 tablets by mouth 2 (two) times daily as needed for diarrhea or loose stools.  Dispense: 60 tablet; Refill: 0  7.  Hypertension Uncontrolled Unable to administer clonidine as heart rate is on the lower border of normal Elevation due to the fact that she is yet to take her antihypertensives Compliance emphasized Counseled on blood pressure goal of less than 130/80, low-sodium, DASH diet, medication compliance, 150 minutes of moderate intensity exercise per week. Discussed medication compliance, adverse effects.  Meds ordered this encounter  Medications  . atorvastatin (LIPITOR) 40 MG tablet    Sig: Take 1 tablet (40 mg total) by mouth daily at 6 PM.    Dispense:  30 tablet    Refill:  3  . cyclobenzaprine (FLEXERIL) 10 MG tablet    Sig: Take 1 tablet (10 mg total) by mouth at bedtime. As needed for muscle spasm    Dispense:  30 tablet    Refill:  3  . zolpidem (AMBIEN) 5 MG tablet    Sig: TAKE ONE TABLET BY MOUTH ONCE DAILY AT BEDTIME AS NEEDED FOR SLEEP    Dispense:  30 tablet    Refill:  2  . acetaminophen-codeine (TYLENOL #3) 300-30 MG tablet    Sig: Take 1 tablet by mouth every 8 (eight) hours as needed for up to 30 days for moderate pain.    Dispense:  30 tablet    Refill:  0  . diphenoxylate-atropine (LOMOTIL) 2.5-0.025 MG tablet    Sig:  Take 2 tablets by mouth 2 (two) times daily as needed for diarrhea or loose stools.    Dispense:  60 tablet    Refill:  0    Follow-up: Return in about 1 month (around 06/10/2018) for PCP -discuss chronic pain.       Charlott Rakes, MD, FAAFP. [facility] and [place], [place]   05/10/2018, 11:50 AM

## 2018-05-10 NOTE — Patient Instructions (Signed)

## 2018-06-14 ENCOUNTER — Other Ambulatory Visit: Payer: Self-pay | Admitting: Family Medicine

## 2018-06-14 ENCOUNTER — Ambulatory Visit: Payer: Medicaid Other | Admitting: Family Medicine

## 2018-06-14 DIAGNOSIS — K588 Other irritable bowel syndrome: Secondary | ICD-10-CM

## 2018-06-14 DIAGNOSIS — E1140 Type 2 diabetes mellitus with diabetic neuropathy, unspecified: Secondary | ICD-10-CM

## 2018-07-05 ENCOUNTER — Telehealth: Payer: Self-pay | Admitting: Family Medicine

## 2018-07-05 NOTE — Telephone Encounter (Signed)
New Message   Pt is requesting a letter for her daughter because she had to quit her job due to the pt being a diabetic. Please f/u

## 2018-07-05 NOTE — Telephone Encounter (Signed)
Can you find out if daughter took a leave of absence/FMLA or actually quit and what does patient want the letter to say

## 2018-07-05 NOTE — Telephone Encounter (Signed)
LMOM and office number provided on patient voicemail.

## 2018-07-06 NOTE — Telephone Encounter (Signed)
LMOM

## 2018-07-08 ENCOUNTER — Encounter: Payer: Self-pay | Admitting: Family Medicine

## 2018-07-08 NOTE — Telephone Encounter (Signed)
Spoke with patient and she stated that her daughter took a leave of absents from her job. Per pt, her daughter is a Scientist, water quality and she helps patient at home. Per patient, she did not want her daughter working in the public because patient is a diabetic and her daughter's job do not offer her any mask or gloves or any type of protection.   Per pt, daughter was trying to fill out for unemployment and it was saying that she had a parent with underline conditions and that she did not need to be working with people and not having any type of protection. Per pt this has nothing to do with the FMLA. Per patient she thinks the letter will be going to the state unemployment department.

## 2018-07-08 NOTE — Telephone Encounter (Signed)
Staff called [phone] asking to speak with patient and the female that picked up informed staff that it was the wrong number.

## 2018-07-08 NOTE — Telephone Encounter (Signed)
I have typed a letter regarding patient's risk but I can not directly write a letter/excuse for patient's daughter

## 2018-07-12 NOTE — Telephone Encounter (Signed)
Left message on patient mobile number informing her that her letter she requested will be at the front desk ready for pick up.

## 2018-07-18 ENCOUNTER — Other Ambulatory Visit: Payer: Self-pay | Admitting: Family Medicine

## 2018-07-18 DIAGNOSIS — K588 Other irritable bowel syndrome: Secondary | ICD-10-CM

## 2018-07-20 NOTE — Telephone Encounter (Signed)
LMOM [phone].

## 2018-07-28 DIAGNOSIS — R32 Unspecified urinary incontinence: Secondary | ICD-10-CM | POA: Diagnosis not present

## 2018-07-30 NOTE — Telephone Encounter (Signed)
LMOM

## 2018-07-30 NOTE — Telephone Encounter (Signed)
Letter will be mailed to patient due to not being able to reach patient.

## 2018-08-17 DIAGNOSIS — R32 Unspecified urinary incontinence: Secondary | ICD-10-CM | POA: Diagnosis not present

## 2018-08-18 ENCOUNTER — Other Ambulatory Visit: Payer: Self-pay | Admitting: Family Medicine

## 2018-08-18 DIAGNOSIS — E1140 Type 2 diabetes mellitus with diabetic neuropathy, unspecified: Secondary | ICD-10-CM

## 2018-08-26 ENCOUNTER — Other Ambulatory Visit: Payer: Self-pay | Admitting: Family Medicine

## 2018-08-26 DIAGNOSIS — IMO0002 Reserved for concepts with insufficient information to code with codable children: Secondary | ICD-10-CM

## 2018-08-26 DIAGNOSIS — E1165 Type 2 diabetes mellitus with hyperglycemia: Secondary | ICD-10-CM

## 2018-09-01 ENCOUNTER — Ambulatory Visit
Admission: EM | Admit: 2018-09-01 | Discharge: 2018-09-01 | Disposition: A | Discharge status: Home / Self Care | Payer: Medicaid Other | Attending: Physician Assistant | Admitting: Physician Assistant

## 2018-09-01 ENCOUNTER — Other Ambulatory Visit: Payer: Self-pay

## 2018-09-01 ENCOUNTER — Encounter: Payer: Self-pay | Admitting: Emergency Medicine

## 2018-09-01 ENCOUNTER — Telehealth: Payer: Self-pay | Admitting: Family Medicine

## 2018-09-01 DIAGNOSIS — I10 Essential (primary) hypertension: Secondary | ICD-10-CM | POA: Diagnosis not present

## 2018-09-01 DIAGNOSIS — H60331 Swimmer's ear, right ear: Secondary | ICD-10-CM | POA: Diagnosis not present

## 2018-09-01 MED ORDER — NEOMYCIN-POLYMYXIN-HC 3.5-10000-1 OT SUSP: 4.0000 [drp] | Freq: Three times a day (TID) | OTIC | 0 refills | Status: AC

## 2018-09-01 MED ORDER — FLUTICASONE PROPIONATE 50 MCG/ACT NA SUSP: 2.0000 | Freq: Every day | NASAL | 0 refills | Status: DC

## 2018-09-01 NOTE — ED Notes (Signed)
Patient able to ambulate independently  

## 2018-09-01 NOTE — Discharge Instructions (Addendum)
Start cortisporin as directed to cover for outer ear infection. Flonase to cover for fluid behind the ear. As discussed, you can suck on hard candy/sour candy to help with possibly stone in salivary gland. If increased swelling to the jaw, may need to follow up with ENT for further evaluation.

## 2018-09-01 NOTE — Telephone Encounter (Signed)
New Message   Pt states she is having some welling and pain in right ear and is requesting antibiotics. Please f/u

## 2018-09-01 NOTE — Telephone Encounter (Signed)
She needs appointment here tomorrow with an available provider and otherwise she may want to go to urgent care tonight or tomorrow

## 2018-09-01 NOTE — ED Provider Notes (Signed)
[facility]    CSN: [id number] Arrival date & time: 09/01/18  1528     History   Chief Complaint Chief Complaint  Patient presents with  . Facial Swelling    HPI [name] is a 53 y.o. female.   53 year old female with history of diabetes, HTN, CVA comes in for evaluation of 3-day history of right facial pain, ear pain.  States pain is along the right lower jaw, and can radiate to the ear.  Has mild swelling to the area and worse with chewing.  She denies increased swelling with salivation.  Denies erythema, warmth.  Denies dental pain.  Denies changes in hearing, discharge.  Denies fever, chills, night sweats.  Denies URI symptoms such as cough, congestion, sore throat.  She has history of Sialolithiasis.     Past Medical History:  Diagnosis Date  . Anxiety 2002  . Chest tightness   . Depression 2001  . Diabetes type 1, uncontrolled ([place])    at age 50  . Diabetic neuropathy ([place])   . Essential hypertension 2015  . GERD (gastroesophageal reflux disease)    about age of 43  . Nausea and vomiting in adult   . Stroke ([place])   . Urinary frequency   . Yeast vaginitis     Patient Active Problem List   Diagnosis Date Noted  . History of [place] amputation of left foot ([place]) 12/25/2017  . Cerebral thrombosis with cerebral infarction 04/08/2017  . Acute right-sided weakness 04/07/2017  . Slurred speech 04/07/2017  . Right sided weakness 04/07/2017  . Hyperhidrosis 09/01/2016  . Migraine with aura and without status migrainosus, not intractable 07/28/2016  . 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 CKD stage 3 due to type 2 diabetes mellitus ([place]) 06/27/2014  . Vitamin D insufficiency 05/08/2014  . Obesity (BMI 30.0-34.9) 05/08/2014  . 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 Bursitis of left shoulder 02/14/2014  . Neck pain 01/03/2014  . Diabetes mellitus type 2, uncontrolled, with complications ([place]) 66/08/3014   . 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.23) 01/14/2013  . Insomnia 08/04/2012  . Diabetic neuropathy, painful ([place]) 08/04/2011  . Anxiety and depression 05/16/2010  . Essential hypertension, benign 11/01/2007  . Female stress incontinence 11/01/2007  . TOBACCO DEPENDENCE 04/30/2006  . GASTROESOPHAGEAL REFLUX, NO ESOPHAGITIS 04/30/2006  . Irritable bowel syndrome 04/30/2006    Past Surgical History:  Procedure Laterality Date  . ACHILLES TENDON SURGERY Left 03/10/2013   Procedure: LEFT CHOPART AMPUTATION/ LEFT TENDON ACHILLES [place];  Surgeon: Wylene Simmer, MD;  Location: [facility];  Service: Orthopedics;  Laterality: Left;  . AMPUTATION Left 06/15/2012   Procedure: AMPUTATION DIGIT;  Surgeon: Meredith Pel, MD;  Location: [facility];  Service: Orthopedics;  Laterality: Left;  Left great toe revision amputation  . ENDOMETRIAL ABLATION    . LAPAROSCOPIC CHOLECYSTECTOMY  01/30/2015   [facility] day surgery     OB History   No obstetric history on file.      Home Medications    Prior to Admission medications   Medication Sig Start Date End Date Taking? Authorizing Provider  acetaminophen-codeine (TYLENOL #3) 300-30 MG tablet TAKE 1 TABLET EVERY 8 HOURS AS NEEDED FOR MODERATE PAIN (INS WILL ONLY PAY FOR 21) 08/19/18   Fulp, Cammie, MD  alosetron (LOTRONEX) 0.5 MG tablet Take 0.5 mg by mouth 2 (two) times daily. 06/02/17   [provider]  aluminum chloride (DRYSOL) 20 % external solution  Apply topically at bedtime. Patient not taking: Reported on 12/17/2017 09/01/16   Boykin Nearing, MD  aspirin EC 81 MG EC tablet Take 1 tablet (81 mg total) by mouth daily. 04/11/17   Domenic Polite, MD  atorvastatin (LIPITOR) 40 MG tablet Take 1 tablet (40 mg total) by mouth daily at 6 PM. 05/10/18   Charlott Rakes, MD  Blood Glucose Monitoring Suppl (ACCU-CHEK AVIVA PLUS) w/Device KIT CHECK BLOOD SUGARS THREE TIMES A DAY.  DX:250.03 10/06/17   Fulp, Cammie, MD  calcium  citrate-vitamin D 500-400 MG-UNIT chewable tablet Chew 1 tablet by mouth 2 (two) times daily. 05/09/14   Funches, Adriana Mccallum, MD  Cholecalciferol ([employer] VIT D 5000 HIGH-POTENCY) 5000 units capsule Take 5,000 Units by mouth daily.    [provider]  cyclobenzaprine (FLEXERIL) 10 MG tablet Take 1 tablet (10 mg total) by mouth at bedtime. As needed for muscle spasm 05/10/18   Charlott Rakes, MD  DEXILANT 60 MG capsule Take 60 mg by mouth daily. 06/09/17   [provider]  diphenoxylate-atropine (LOMOTIL) 2.5-0.025 MG tablet TAKE 2 TABLETS BY MOUTH 2 (TWO) TIMES DAILY AS NEEDED FOR DIARRHEA OR LOOSE STOOLS. 07/19/18   Fulp, Cammie, MD  DULoxetine (CYMBALTA) 60 MG capsule Take 1 capsule (60 mg total) by mouth daily. 12/17/17   Fulp, Cammie, MD  fluticasone (FLONASE) 50 MCG/ACT nasal spray Place 2 sprays into both nostrils daily. 09/01/18   Tasia Catchings, Taurus Willis V, PA-C  glucose blood (ACCU-CHEK AVIVA PLUS) test strip USE TO CHECK BLOOD SUGAR BEFORE MEALS AND AT BEDTIME dx code E10.65 10/06/17   Fulp, Cammie, MD  hydrALAZINE (APRESOLINE) 50 MG tablet Take 1 tablet (50 mg total) by mouth 3 (three) times daily. 10/06/17 10/06/18  Fulp, Cammie, MD  Insulin Glargine (LANTUS SOLOSTAR) 100 UNIT/ML Solostar Pen INJECT 50 UNITS SUBCUTANEOUSLY EVERY DAY 08/26/18   Fulp, Cammie, MD  insulin lispro (HUMALOG KWIKPEN) 100 UNIT/ML KiwkPen Inject 0.13 mLs (13 Units total) into the skin 3 (three) times daily. 11/04/17   Charlott Rakes, MD  neomycin-polymyxin-hydrocortisone (CORTISPORIN) 3.5-10000-1 OTIC suspension Place 4 drops into the right ear 3 (three) times daily for 7 days. 09/01/18 09/08/18  Ok Edwards, PA-C  promethazine (PHENERGAN) 25 MG tablet Take 1 tablet (25 mg total) by mouth every 8 (eight) hours as needed for nausea or vomiting. 10/03/16   Funches, Adriana Mccallum, MD  topiramate (TOPAMAX) 25 MG tablet Take 1 tablet (25 mg total) by mouth 2 (two) times daily. Patient not taking: Reported on 05/10/2018 06/25/17   Garvin Fila, MD   zolpidem (AMBIEN) 5 MG tablet TAKE ONE TABLET BY MOUTH ONCE DAILY AT BEDTIME AS NEEDED FOR SLEEP 05/10/18   Charlott Rakes, MD  amLODipine (NORVASC) 10 MG tablet Take 1 tablet (10 mg total) by mouth daily. Patient not taking: Reported on 10/13/2017 10/03/16 10/26/17  Boykin Nearing, MD    Family History Family History  Problem Relation Age of Onset  . Diabetes Mother   . Heart attack Mother   . Heart disease Mother        before age 6  . Hypertension Mother   . Hyperlipidemia Mother   . Diabetes Father   . Heart attack Father   . Heart disease Father        before age 24  . Diabetes Sister   . Hypertension Sister     Social History Social History   Tobacco Use  . Smoking status: Light Tobacco Smoker    Packs/day: 0.25    Years: 22.00  Pack years: 5.50    Types: Cigarettes  . Smokeless tobacco: Never Used  Substance Use Topics  . Alcohol use: Yes    Alcohol/week: 0.0 standard drinks    Comment: sociial  . Drug use: No     Allergies   Lidocaine and Latex   Review of Systems Review of Systems  Reason unable to perform ROS: See HPI as above.     Physical Exam Triage Vital Signs ED Triage Vitals  Enc Vitals Group     BP 09/01/18 1538 (!) 180/81     Pulse Rate 09/01/18 1538 67     Resp 09/01/18 1538 18     Temp 09/01/18 1538 97.9 F (36.6 C)     Temp Source 09/01/18 1538 Temporal     SpO2 09/01/18 1538 94 %     Weight --      Height --      Head Circumference --      Peak Flow --      Pain Score 09/01/18 1536 10     Pain Loc --      Pain Edu? --      Excl. in [place]? --    No data found.  Updated Vital Signs BP (!) 180/81 (BP Location: Left Arm)   Pulse 67   Temp 97.9 F (36.6 C) (Temporal)   Resp 18   SpO2 94%   Visual Acuity Right Eye Distance:   Left Eye Distance:   Bilateral Distance:    Right Eye Near:   Left Eye Near:    Bilateral Near:     Physical Exam Constitutional:      General: She is not in acute distress.    Appearance:  Normal appearance. She is not ill-appearing, toxic-appearing or diaphoretic.  HENT:     Head: Normocephalic and atraumatic.     Ears:     Comments: Tenderness to palpation of right tragus.  Ear canal swelling with mild erythema.  TM visible, opaque without erythema or bulging.  Left ear tragus without tenderness.  No ear canal swelling or erythema.  TM visible, opaque without erythema or bulging.    Mouth/Throat:     Mouth: Mucous membranes are moist.     Pharynx: Oropharynx is clear. Uvula midline.     Comments: No obvious facial swelling.  No tenderness to palpation along right jaw.  No tenderness to palpation of tooth, gum area. Neck:     Musculoskeletal: Normal range of motion and neck supple.  Cardiovascular:     Rate and Rhythm: Normal rate and regular rhythm.     Heart sounds: Normal heart sounds. No murmur. No friction rub. No gallop.   Pulmonary:     Effort: Pulmonary effort is normal. No accessory muscle usage, prolonged expiration, respiratory distress or retractions.     Comments: Lungs clear to auscultation without adventitious lung sounds. Neurological:     General: No focal deficit present.     Mental Status: She is alert and oriented to person, place, and time.      UC Treatments / Results  Labs (all labs ordered are listed, but only abnormal results are displayed) Labs Reviewed - No data to display  EKG   Radiology No results found.  Procedures Procedures (including critical care time)  Medications Ordered in UC Medications - No data to display  Initial Impression / Assessment and Plan / UC Course  I have reviewed the triage vital signs and the nursing notes.  Pertinent labs &  imaging results that were available during my care of the patient were reviewed by me and considered in my medical decision making (see chart for details).    Discussed possible otitis externa/eustachian tube dysfunction causing symptoms.  Will start Cortisporin as directed.   Flonase as directed. Patient feels that symptoms similar to when she had Sialolithiasis, will have patient suck on hard candy/sour candy to help with those symptoms.  Return precautions given.  Patient expresses understanding and agrees to plan.  Final Clinical Impressions(s) / UC Diagnoses   Final diagnoses:  Acute swimmer's ear of right side   ED Prescriptions    Medication Sig Dispense Auth. Provider   neomycin-polymyxin-hydrocortisone (CORTISPORIN) 3.5-10000-1 OTIC suspension Place 4 drops into the right ear 3 (three) times daily for 7 days. 4.2 mL Dodger Sinning V, PA-C   fluticasone (FLONASE) 50 MCG/ACT nasal spray Place 2 sprays into both nostrils daily. 1 g Tobin Chad, [place] 09/01/18 1621

## 2018-09-01 NOTE — ED Triage Notes (Signed)
Pt presents to [facility] for assessment of right facial pain moving up to hear ear x 3 days.  Hx of a blocked saliva gland, feels similar.

## 2018-09-02 NOTE — Telephone Encounter (Signed)
Attempted to contact patient unable to reach

## 2018-09-02 NOTE — Telephone Encounter (Signed)
Patient needs to be scheduled an appointment or needs to report to the [facility]

## 2018-09-05 ENCOUNTER — Other Ambulatory Visit: Payer: Self-pay | Admitting: Family Medicine

## 2018-09-05 DIAGNOSIS — R252 Cramp and spasm: Secondary | ICD-10-CM

## 2018-09-08 DIAGNOSIS — R32 Unspecified urinary incontinence: Secondary | ICD-10-CM | POA: Diagnosis not present

## 2018-09-14 ENCOUNTER — Other Ambulatory Visit: Payer: Self-pay | Admitting: Family Medicine

## 2018-09-14 DIAGNOSIS — K588 Other irritable bowel syndrome: Secondary | ICD-10-CM

## 2018-10-05 ENCOUNTER — Other Ambulatory Visit: Payer: Self-pay | Admitting: Family Medicine

## 2018-10-05 DIAGNOSIS — R252 Cramp and spasm: Secondary | ICD-10-CM

## 2018-10-12 ENCOUNTER — Encounter (HOSPITAL_BASED_OUTPATIENT_CLINIC_OR_DEPARTMENT_OTHER): Payer: Medicaid Other | Attending: Internal Medicine

## 2018-10-12 DIAGNOSIS — I10 Essential (primary) hypertension: Secondary | ICD-10-CM | POA: Diagnosis not present

## 2018-10-12 DIAGNOSIS — T8189XA Other complications of procedures, not elsewhere classified, initial encounter: Secondary | ICD-10-CM | POA: Diagnosis not present

## 2018-10-12 DIAGNOSIS — Z794 Long term (current) use of insulin: Secondary | ICD-10-CM | POA: Diagnosis not present

## 2018-10-12 DIAGNOSIS — E1040 Type 1 diabetes mellitus with diabetic neuropathy, unspecified: Secondary | ICD-10-CM | POA: Diagnosis not present

## 2018-10-12 DIAGNOSIS — L97422 Non-pressure chronic ulcer of left heel and midfoot with fat layer exposed: Secondary | ICD-10-CM | POA: Diagnosis not present

## 2018-10-12 DIAGNOSIS — F17210 Nicotine dependence, cigarettes, uncomplicated: Secondary | ICD-10-CM | POA: Diagnosis not present

## 2018-10-12 DIAGNOSIS — E10621 Type 1 diabetes mellitus with foot ulcer: Secondary | ICD-10-CM | POA: Insufficient documentation

## 2018-10-12 DIAGNOSIS — Z8673 Personal history of transient ischemic attack (TIA), and cerebral infarction without residual deficits: Secondary | ICD-10-CM | POA: Insufficient documentation

## 2018-10-13 DIAGNOSIS — R32 Unspecified urinary incontinence: Secondary | ICD-10-CM | POA: Diagnosis not present

## 2018-10-14 ENCOUNTER — Other Ambulatory Visit: Payer: Self-pay | Admitting: Family Medicine

## 2018-10-14 DIAGNOSIS — G4700 Insomnia, unspecified: Secondary | ICD-10-CM

## 2018-10-14 DIAGNOSIS — K588 Other irritable bowel syndrome: Secondary | ICD-10-CM

## 2018-10-14 DIAGNOSIS — E1140 Type 2 diabetes mellitus with diabetic neuropathy, unspecified: Secondary | ICD-10-CM

## 2018-10-18 ENCOUNTER — Other Ambulatory Visit: Payer: Self-pay | Admitting: Family Medicine

## 2018-10-18 DIAGNOSIS — G8929 Other chronic pain: Secondary | ICD-10-CM

## 2018-10-18 DIAGNOSIS — K588 Other irritable bowel syndrome: Secondary | ICD-10-CM

## 2018-10-18 DIAGNOSIS — E1140 Type 2 diabetes mellitus with diabetic neuropathy, unspecified: Secondary | ICD-10-CM

## 2018-10-18 DIAGNOSIS — M545 Low back pain, unspecified: Secondary | ICD-10-CM

## 2018-10-18 MED ORDER — DULOXETINE HCL 60 MG PO CPEP: ORAL_CAPSULE | ORAL | 0 refills | Status: DC

## 2018-10-18 MED ORDER — FLUTICASONE PROPIONATE 50 MCG/ACT NA SUSP: 2.0000 | Freq: Every day | NASAL | 0 refills | Status: DC

## 2018-10-18 NOTE — Telephone Encounter (Signed)
Patient needs to schedule and come to an office visit regarding refill of controlled substances

## 2018-10-18 NOTE — Telephone Encounter (Signed)
Per Clinical Pharmacist:  Will provide 1 month supply of Flonase and Cymbalta. Regarding her renal function, pt with CKD and last GFR taken in 12/2017. The manufacturer recommends to avoid use in CrCl < 30, but it may be used at low doses (not to exceed 60 mg daily).  I have deferred controlled rx's to patient's PCP

## 2018-10-18 NOTE — Telephone Encounter (Signed)
Will provide 1 month supply of Flonase and Cymbalta. Regarding her renal function, pt with CKD and last GFR taken in 12/2017. The manufacturer recommends to avoid use in CrCl < 30, but it may be used at low doses (not to exceed 60 mg daily).  I have deferred controlled rx's to patient's PCP.

## 2018-10-18 NOTE — Telephone Encounter (Signed)
1) Medication(s) Requested (by name): fluticasone (FLONASE) 50 MCG/ACT nasal spray [id number]  diphenoxylate-atropine (LOMOTIL) 2.5-0.025 MG tablet [id number] DULoxetine (CYMBALTA) 60 MG capsule [id number]  acetaminophen-codeine (TYLENOL #3) 300-30 MG tablet [id number]    2) Pharmacy of Choice: [id number] - [place], [place].  3) Special Requests:   Approved medications will be sent to the pharmacy, we will reach out if there is an issue.  Requests made after 3pm may not be addressed until the following business day!  If a patient is unsure of the name of the medication(s) please note and ask patient to call back when they are able to provide all info, do not send to responsible party until all information is available!

## 2018-10-19 NOTE — Telephone Encounter (Signed)
LMOM

## 2018-10-20 DIAGNOSIS — E11621 Type 2 diabetes mellitus with foot ulcer: Secondary | ICD-10-CM | POA: Diagnosis not present

## 2018-10-20 DIAGNOSIS — L97522 Non-pressure chronic ulcer of other part of left foot with fat layer exposed: Secondary | ICD-10-CM | POA: Diagnosis not present

## 2018-10-20 DIAGNOSIS — E10621 Type 1 diabetes mellitus with foot ulcer: Secondary | ICD-10-CM | POA: Diagnosis not present

## 2018-10-22 MED FILL — DULoxetine HCL 60 MG CPEP: 60 | 30 days supply | Qty: 30 | Fill #0

## 2018-10-25 ENCOUNTER — Other Ambulatory Visit (HOSPITAL_COMMUNITY): Payer: Self-pay | Admitting: Physician Assistant

## 2018-10-25 ENCOUNTER — Ambulatory Visit (HOSPITAL_COMMUNITY)
Admission: RE | Admit: 2018-10-25 | Discharge: 2018-10-25 | Disposition: A | Discharge status: Home / Self Care | Payer: Medicaid Other | Source: Ambulatory Visit | Attending: Physician Assistant | Admitting: Physician Assistant

## 2018-10-25 ENCOUNTER — Other Ambulatory Visit: Payer: Self-pay

## 2018-10-25 DIAGNOSIS — L97529 Non-pressure chronic ulcer of other part of left foot with unspecified severity: Secondary | ICD-10-CM | POA: Diagnosis not present

## 2018-10-25 DIAGNOSIS — M7989 Other specified soft tissue disorders: Secondary | ICD-10-CM | POA: Diagnosis not present

## 2018-10-26 ENCOUNTER — Other Ambulatory Visit: Payer: Self-pay | Admitting: Family Medicine

## 2018-10-26 DIAGNOSIS — R252 Cramp and spasm: Secondary | ICD-10-CM

## 2018-10-27 DIAGNOSIS — E10621 Type 1 diabetes mellitus with foot ulcer: Secondary | ICD-10-CM | POA: Diagnosis not present

## 2018-10-27 DIAGNOSIS — L97522 Non-pressure chronic ulcer of other part of left foot with fat layer exposed: Secondary | ICD-10-CM | POA: Diagnosis not present

## 2018-10-27 DIAGNOSIS — E11621 Type 2 diabetes mellitus with foot ulcer: Secondary | ICD-10-CM | POA: Diagnosis not present

## 2018-10-28 ENCOUNTER — Other Ambulatory Visit: Payer: Self-pay | Admitting: Internal Medicine

## 2018-10-28 DIAGNOSIS — L97509 Non-pressure chronic ulcer of other part of unspecified foot with unspecified severity: Secondary | ICD-10-CM

## 2018-10-28 DIAGNOSIS — E11621 Type 2 diabetes mellitus with foot ulcer: Secondary | ICD-10-CM

## 2018-10-29 ENCOUNTER — Other Ambulatory Visit: Payer: Self-pay

## 2018-10-29 ENCOUNTER — Ambulatory Visit: Payer: Medicaid Other | Attending: Family Medicine | Admitting: Family Medicine

## 2018-10-29 ENCOUNTER — Encounter: Payer: Self-pay | Admitting: Family Medicine

## 2018-10-29 VITALS — BP 173/71 | HR 83 | Temp 98.6°F | Resp 18 | Ht 70.0 in | Wt 209.0 lb

## 2018-10-29 DIAGNOSIS — Z79899 Other long term (current) drug therapy: Secondary | ICD-10-CM

## 2018-10-29 DIAGNOSIS — E1165 Type 2 diabetes mellitus with hyperglycemia: Secondary | ICD-10-CM | POA: Diagnosis not present

## 2018-10-29 DIAGNOSIS — R350 Frequency of micturition: Secondary | ICD-10-CM

## 2018-10-29 DIAGNOSIS — N183 Chronic kidney disease, stage 3 unspecified: Secondary | ICD-10-CM

## 2018-10-29 DIAGNOSIS — Z794 Long term (current) use of insulin: Secondary | ICD-10-CM | POA: Diagnosis not present

## 2018-10-29 DIAGNOSIS — E1140 Type 2 diabetes mellitus with diabetic neuropathy, unspecified: Secondary | ICD-10-CM

## 2018-10-29 DIAGNOSIS — K580 Irritable bowel syndrome with diarrhea: Secondary | ICD-10-CM

## 2018-10-29 DIAGNOSIS — E118 Type 2 diabetes mellitus with unspecified complications: Secondary | ICD-10-CM | POA: Diagnosis not present

## 2018-10-29 DIAGNOSIS — Z760 Encounter for issue of repeat prescription: Secondary | ICD-10-CM | POA: Insufficient documentation

## 2018-10-29 DIAGNOSIS — Z7982 Long term (current) use of aspirin: Secondary | ICD-10-CM | POA: Insufficient documentation

## 2018-10-29 DIAGNOSIS — E1122 Type 2 diabetes mellitus with diabetic chronic kidney disease: Secondary | ICD-10-CM

## 2018-10-29 DIAGNOSIS — I129 Hypertensive chronic kidney disease with stage 1 through stage 4 chronic kidney disease, or unspecified chronic kidney disease: Secondary | ICD-10-CM | POA: Diagnosis not present

## 2018-10-29 DIAGNOSIS — Z8249 Family history of ischemic heart disease and other diseases of the circulatory system: Secondary | ICD-10-CM | POA: Insufficient documentation

## 2018-10-29 DIAGNOSIS — I150 Renovascular hypertension: Secondary | ICD-10-CM | POA: Diagnosis not present

## 2018-10-29 DIAGNOSIS — F17210 Nicotine dependence, cigarettes, uncomplicated: Secondary | ICD-10-CM | POA: Diagnosis not present

## 2018-10-29 DIAGNOSIS — K219 Gastro-esophageal reflux disease without esophagitis: Secondary | ICD-10-CM | POA: Diagnosis not present

## 2018-10-29 DIAGNOSIS — Z8673 Personal history of transient ischemic attack (TIA), and cerebral infarction without residual deficits: Secondary | ICD-10-CM | POA: Insufficient documentation

## 2018-10-29 DIAGNOSIS — Z833 Family history of diabetes mellitus: Secondary | ICD-10-CM | POA: Insufficient documentation

## 2018-10-29 DIAGNOSIS — Z89432 Acquired absence of left foot: Secondary | ICD-10-CM

## 2018-10-29 DIAGNOSIS — G8929 Other chronic pain: Secondary | ICD-10-CM | POA: Diagnosis not present

## 2018-10-29 DIAGNOSIS — E1142 Type 2 diabetes mellitus with diabetic polyneuropathy: Secondary | ICD-10-CM | POA: Diagnosis not present

## 2018-10-29 DIAGNOSIS — Z1211 Encounter for screening for malignant neoplasm of colon: Secondary | ICD-10-CM | POA: Diagnosis not present

## 2018-10-29 DIAGNOSIS — IMO0002 Reserved for concepts with insufficient information to code with codable children: Secondary | ICD-10-CM

## 2018-10-29 LAB — POCT GLYCOSYLATED HEMOGLOBIN (HGB A1C): Hemoglobin A1C: 6.7 % — AB (ref 4.0–5.6)

## 2018-10-29 LAB — GLUCOSE, POCT (MANUAL RESULT ENTRY): POC Glucose: 150 mg/dL — AB (ref 70–99)

## 2018-10-29 NOTE — Progress Notes (Signed)
Established Patient Office Visit  Subjective:  Patient ID: [name], female    DOB: 06/26/1965  Age: 53 y.o. MRN: [id number]  CC:  Chief Complaint  Patient presents with  . Medication Refill  . Diabetes  . Hypertension    HPI [name] presents for follow-up of chronic medical issues.  She was last seen in the office by another provider on 05/10/2018.  Patient however states that she also has an appointment at the [facility] regarding her prosthesis  status post left Chopart amputation.  She reports that her current prosthesis irritates the skin on her stump at the site of the amputation.  Patient reports that she contacted the wound care center to be seen regarding the wound and she now attends wound care center to help heal out the area of irritation.  Patient reports that she continues to take her Lantus and Humalog.  She has had to slightly decrease her dose of Lantus due to issues with low blood sugars overnight.  She reports that her fasting blood sugars continue to be controlled, usually in the low 100s or sometimes in the 90s.  She continues to have issues with chronic right foot pain as well as pain at the site of amputation on the left.  Despite having good control of her blood sugars, she has noticed that she has had an increase in urinary frequency.  He does have some urgency of urination as well.        She did run out of blood pressure medications for approximately 2 days but then found 1 pill that she took last night.  She will need refill of blood pressure medication.  She did feel that her blood pressure was controlled when she was on her blood pressure medication daily.  She reports that she did not follow-up with nephrology after her last visit and needs to schedule follow-up regarding her chronic kidney disease.  She continues to have intermittent issues with diarrhea.  She denies any blood in the stool or dark stools.  She agrees to do home fecal occult testing  as a screening for colonoscopy but actual referral to GI along with colonoscopy was recommended as patient also with diarrhea predominant IBS.  Past Medical History:  Diagnosis Date  . Anxiety 2002  . Chest tightness   . Depression 2001  . Diabetes type 1, uncontrolled ([place])    at age 53  . Diabetic neuropathy ([place])   . Essential hypertension 2015  . GERD (gastroesophageal reflux disease)    about age of 30  . Nausea and vomiting in adult   . Stroke ([place])   . Urinary frequency   . Yeast vaginitis     Past Surgical History:  Procedure Laterality Date  . ACHILLES TENDON SURGERY Left 03/10/2013   Procedure: LEFT CHOPART AMPUTATION/ LEFT TENDON ACHILLES [place];  Surgeon: Wylene Simmer, MD;  Location: [place];  Service: Orthopedics;  Laterality: Left;  . AMPUTATION Left 06/15/2012   Procedure: AMPUTATION DIGIT;  Surgeon: Meredith Pel, MD;  Location: [facility];  Service: Orthopedics;  Laterality: Left;  Left great toe revision amputation  . ENDOMETRIAL ABLATION    . LAPAROSCOPIC CHOLECYSTECTOMY  01/30/2015   [facility]     Family History  Problem Relation Age of Onset  . Diabetes Mother   . Heart attack Mother   . Heart disease Mother        before age 63  . Hypertension Mother   .  Hyperlipidemia Mother   . Diabetes Father   . Heart attack Father   . Heart disease Father        before age 62  . Diabetes Sister   . Hypertension Sister     Social History   Socioeconomic History  . Marital status: Divorced    Spouse name: Not on file  . Number of children: 1  . Years of education: Not on file  . Highest education level: Not on file  Occupational History    Employer: [employer]  . Financial resource strain: Not on file  . Food insecurity    Worry: Not on file    Inability: Not on file  . Transportation needs    Medical: Not on file    Non-medical: Not on file  Tobacco Use  . Smoking status: Light Tobacco Smoker    Packs/day: 0.25    Years: 22.00     Pack years: 5.50    Types: Cigarettes  . Smokeless tobacco: Never Used  Substance and Sexual Activity  . Alcohol use: Yes    Alcohol/week: 0.0 standard drinks    Comment: sociial  . Drug use: No  . Sexual activity: Yes    Partners: Male  Lifestyle  . Physical activity    Days per week: Not on file    Minutes per session: Not on file  . Stress: Not on file  Relationships  . Social Herbalist on phone: Not on file    Gets together: Not on file    Attends religious service: Not on file    Active member of club or organization: Not on file    Attends meetings of clubs or organizations: Not on file    Relationship status: Not on file  . Intimate partner violence    Fear of current or ex partner: Not on file    Emotionally abused: Not on file    Physically abused: Not on file    Forced sexual activity: Not on file  Other Topics Concern  . Not on file  Social History Narrative   Lives at home with her daughter   Right handed   Caffeine: 3 cups daily    Outpatient Medications Prior to Visit  Medication Sig Dispense Refill  . alosetron (LOTRONEX) 0.5 MG tablet Take 0.5 mg by mouth 2 (two) times daily.  6  . aspirin EC 81 MG EC tablet Take 1 tablet (81 mg total) by mouth daily. 30 tablet 0  . atorvastatin (LIPITOR) 40 MG tablet TAKE 1 TABLET (40 MG TOTAL) BY MOUTH DAILY AT 6 PM. 30 tablet 0  . Blood Glucose Monitoring Suppl (ACCU-CHEK AVIVA PLUS) w/Device KIT CHECK BLOOD SUGARS THREE TIMES A DAY.  DX:250.03 1 kit 0  . calcium citrate-vitamin D 500-400 MG-UNIT chewable tablet Chew 1 tablet by mouth 2 (two) times daily. 180 tablet 1  . Cholecalciferol ([facility] VIT D 5000 HIGH-POTENCY) 5000 units capsule Take 5,000 Units by mouth daily.    . cyclobenzaprine (FLEXERIL) 10 MG tablet TAKE 1 TABLET AT BEDTIME AS NEEDED FOR MUSCLE SPASM 30 tablet 0  . DEXILANT 60 MG capsule Take 60 mg by mouth daily.  6  . DULoxetine (CYMBALTA) 60 MG capsule Take 1 cap by mouth per day. Must keep  appt next week for more refills. 30 capsule 0  . fluticasone (FLONASE) 50 MCG/ACT nasal spray Place 2 sprays into both nostrils daily. 1 g 0  . glucose blood (ACCU-CHEK AVIVA  PLUS) test strip USE TO CHECK BLOOD SUGAR BEFORE MEALS AND AT BEDTIME dx code E10.65 125 each 6  . hydrALAZINE (APRESOLINE) 50 MG tablet Take 1 tablet (50 mg total) by mouth 3 (three) times daily. 90 tablet 11  . Insulin Glargine (LANTUS SOLOSTAR) 100 UNIT/ML Solostar Pen INJECT 50 UNITS SUBCUTANEOUSLY EVERY DAY 15 mL 2  . insulin lispro (HUMALOG KWIKPEN) 100 UNIT/ML KiwkPen Inject 0.13 mLs (13 Units total) into the skin 3 (three) times daily. 15 mL 6  . promethazine (PHENERGAN) 25 MG tablet Take 1 tablet (25 mg total) by mouth every 8 (eight) hours as needed for nausea or vomiting. 20 tablet 0  . zolpidem (AMBIEN) 5 MG tablet TAKE ONE TABLET BY MOUTH ONCE DAILY AT BEDTIME AS NEEDED FOR SLEEP 30 tablet 2  . acetaminophen-codeine (TYLENOL #3) 300-30 MG tablet TAKE 1 TABLET EVERY 8 HOURS AS NEEDED FOR MODERATE PAIN (INS WILL ONLY PAY FOR 21) (Patient not taking: Reported on 10/29/2018) 21 tablet 1  . aluminum chloride (DRYSOL) 20 % external solution Apply topically at bedtime. (Patient not taking: Reported on 12/17/2017) 35 mL 5  . diphenoxylate-atropine (LOMOTIL) 2.5-0.025 MG tablet TAKE 2 TABLETS BY MOUTH 2 (TWO) TIMES DAILY AS NEEDED FOR DIARRHEA OR LOOSE STOOLS. (Patient not taking: Reported on 10/29/2018) 60 tablet 0  . topiramate (TOPAMAX) 25 MG tablet Take 1 tablet (25 mg total) by mouth 2 (two) times daily. (Patient not taking: Reported on 05/10/2018) 120 tablet 3   No facility-administered medications prior to visit.     Allergies  Allergen Reactions  . Lidocaine Itching  . Latex Rash    ROS Review of Systems  Constitutional: Positive for fatigue. Negative for chills and fever.  HENT: Negative for sore throat and trouble swallowing.   Respiratory: Negative for cough and shortness of breath.   Cardiovascular:  Negative for chest pain, palpitations and leg swelling.  Gastrointestinal: Positive for diarrhea. Negative for abdominal pain, blood in stool and constipation.  Endocrine: Positive for polyuria. Negative for cold intolerance, heat intolerance, polydipsia and polyphagia.  Genitourinary: Positive for frequency. Negative for dysuria.  Musculoskeletal: Positive for gait problem. Negative for arthralgias, back pain and joint swelling.  Skin: Positive for wound. Negative for rash.  Neurological: Positive for weakness and numbness. Negative for dizziness and headaches.  Hematological: Negative for adenopathy. Does not bruise/bleed easily.  Psychiatric/Behavioral: Positive for behavioral problems and sleep disturbance. Negative for self-injury and suicidal ideas. The patient is nervous/anxious.       Objective:    Physical Exam  Constitutional: She is oriented to person, place, and time. She appears well-developed and well-nourished.  Neck: Normal range of motion. No JVD present.  Cardiovascular: Normal rate and regular rhythm.  Pulmonary/Chest: Effort normal.  Abdominal: Soft. There is no abdominal tenderness. There is no rebound and no guarding.  Musculoskeletal:        General: Deformity (Status post left Chopart amputation) present. No edema.     Comments: Diabetic foot exam declined by the patient. (she reports that she is currently attending wound care for her left ankle stump).  Patient is wearing her left foot prosthetic  Lymphadenopathy:    She has no cervical adenopathy.  Neurological: She is alert and oriented to person, place, and time.  Skin: Skin is warm and dry.  Psychiatric: She has a normal mood and affect. Her behavior is normal.  Nursing note and vitals reviewed.   BP (!) 173/71 (BP Location: Left Arm, Patient Position: Sitting, Cuff Size: Large)  Pulse 83   Temp 98.6 F (37 C) (Oral)   Resp 18   Ht '5\' 10"'  (1.778 m)   Wt 209 lb (94.8 kg)   SpO2 100%   BMI 29.99  kg/m  Wt Readings from Last 3 Encounters:  10/29/18 209 lb (94.8 kg)  05/10/18 216 lb 6.4 oz (98.2 kg)  12/17/17 214 lb 12.8 oz (97.4 kg)     Health Maintenance Due  Topic Date Due  . PNEUMOCOCCAL POLYSACCHARIDE VACCINE AGE 46-64 HIGH RISK  05/25/1969  . OPHTHALMOLOGY EXAM  05/08/2012  . URINE MICROALBUMIN  11/16/2015  . FOOT EXAM  01/09/2017  . MAMMOGRAM  05/25/2017  . COLONOSCOPY  05/25/2017  . PAP SMEAR-Modifier  12/28/2017  . INFLUENZA VACCINE  10/02/2018      Lab Results  Component Value Date   TSH 2.862 04/07/2017   Lab Results  Component Value Date   WBC 15.0 (H) 10/06/2017   HGB 12.5 10/06/2017   HCT 38.3 10/06/2017   MCV 85 10/06/2017   PLT 368 10/06/2017   Lab Results  Component Value Date   NA 143 12/17/2017   K 4.9 12/17/2017   CO2 21 12/17/2017   GLUCOSE 81 12/17/2017   BUN 35 (H) 12/17/2017   CREATININE 2.67 (H) 12/17/2017   BILITOT <0.2 10/06/2017   ALKPHOS 116 10/06/2017   AST 12 10/06/2017   ALT 9 10/06/2017   PROT 7.4 10/06/2017   ALBUMIN 4.1 10/06/2017   CALCIUM 8.9 12/17/2017   ANIONGAP 11 04/10/2017   GFR 36.28 (L) 08/01/2015   Lab Results  Component Value Date   CHOL 205 (H) 04/08/2017   Lab Results  Component Value Date   HDL 26 (L) 04/08/2017   Lab Results  Component Value Date   LDLCALC 101 (H) 04/08/2017   Lab Results  Component Value Date   TRIG 389 (H) 04/08/2017   Lab Results  Component Value Date   CHOLHDL 7.9 04/08/2017   Lab Results  Component Value Date   HGBA1C 6.7 (A) 10/29/2018      Assessment & Plan:  1. Diabetes mellitus type 2, uncontrolled, with complications ([place]) Patient reports that she has adjusted her insulin due to low blood sugars.  She currently has chronic kidney disease, history of left Chopart amputation as well as peripheral neuropathy related to her diabetes.  She was also encouraged to schedule her yearly diabetic eye exam.  She reports that she is also currently attending wound care  for a area of irritation on her left amputation site stump.  Patient declined influenza immunization and pneumococcal.  She will have hemoglobin A1c at today's visit as well as comprehensive metabolic panel, urinalysis as patient also with complaint of urinary frequency will again be referred to nephrology and again the importance of follow-up was stressed due to her chronic kidney disease.  Patient will also have urine microalbumin creatinine ratio. - HgB A1c - Glucose (CBG) - Comprehensive metabolic panel - POCT URINALYSIS DIP ([facility]) - Ambulatory referral to Nephrology - Microalbumin / creatinine urine ratio; Future - Microalbumin / creatinine urine ratio  2. CKD stage 3 due to type 2 diabetes mellitus ([place]) Patient with history of chronic kidney disease related to her type 2 diabetes and hypertension.  Patient will have repeat creatinine and electrolytes as part of complete metabolic panel, CBC to look for anemia associated with chronic kidney disease and microalbumin creatinine ratio.  Referral was again placed for patient to follow-up with nephrology and importance of follow-up was again  stressed to the patient. - CBC with Differential - Ambulatory referral to Nephrology - Microalbumin / creatinine urine ratio; Future - Microalbumin / creatinine urine ratio  3. Urinary frequency She reports that her blood sugars have been well controlled however she has had recent issues with increased urinary frequency.  Urinalysis will be done at today's visit to look for possible urinary tract infection and will also obtain comprehensive metabolic panel in follow-up of blood sugar and creatinine. - Comprehensive metabolic panel - POCT URINALYSIS DIP ([facility])  4. Renovascular hypertension Blood pressure elevated at today's visit at 173/71.  She is encouraged to continue her hydralazine 50 mg 3 times daily.  Amlodipine 5 mg daily will also be added.  Patient has been on this medication in the past  and I am not sure if this was discontinued due to ineffectiveness and patient cannot recall the reason for discontinuation of the medication.  Patient is strongly encouraged to keep follow-up with nephrology and new referral to nephrology has been placed at today's visit. - Ambulatory referral to Nephrology  5. Encounter for screening for colon cancer Patient agrees to complete fecal occult blood collection at home to return to the clinic/lab for evaluation as a screening for colon cancer.  She was encouraged to follow-up with gastroenterology and discuss the need for an actual colonoscopy as she has a history of IBS, diarrhea predominant. - Fecal occult blood, imunochemical([facility])  6. History of Chopart amputation of left foot [facility]) Patient reports that she has developed skin irritation at the site of her left foot show part amputation.  Patient reports that she is attending wound care to help with healing of this area.  She also has an appointment after today's visit to be seen at [facility] regarding adjustment of her prosthesis.  Discussed importance of controlling her blood sugar to help prevent issues with skin breakdown as well as to assist with wound healing.  7. Diabetic neuropathy, painful ([place]) She continues to have issues with painful diabetic neuropathy.  Patient has been taking Tylenol 3 but had been notified that this would no longer be refilled if she did not come in for an office visit.  8. Irritable bowel syndrome with diarrhea She is encouraged to follow-up with gastroenterology regarding her IBS with diarrhea.  Patient has found some relief from diarrhea with the use of Lomotil and she would like to have continued refills.  9. Long-term use of high-risk medication Patient will have complete metabolic panel in follow-up of long-term use of high-risk medications including medications for the treatment of diabetes, hypertension, diarrhea and for chronic pain.  An  After Visit Summary was printed and given to the patient.   Follow-up: Return in about 3 weeks (around 11/19/2018) for Hypertension and urinary frequency.   Antony Blackbird, MD

## 2018-10-29 NOTE — Telephone Encounter (Signed)
LMOM

## 2018-10-30 LAB — CBC WITH DIFFERENTIAL/PLATELET
Basophils Absolute: 0.1 x10E3/uL (ref 0.0–0.2)
Basos: 1 %
EOS (ABSOLUTE): 0.2 x10E3/uL (ref 0.0–0.4)
Eos: 2 %
Hematocrit: 36.6 % (ref 34.0–46.6)
Hemoglobin: 12 g/dL (ref 11.1–15.9)
Immature Grans (Abs): 0 x10E3/uL (ref 0.0–0.1)
Immature Granulocytes: 0 %
Lymphocytes Absolute: 2.6 x10E3/uL (ref 0.7–3.1)
Lymphs: 19 %
MCH: 27.6 pg (ref 26.6–33.0)
MCHC: 32.8 g/dL (ref 31.5–35.7)
MCV: 84 fL (ref 79–97)
Monocytes Absolute: 0.9 x10E3/uL (ref 0.1–0.9)
Monocytes: 6 %
Neutrophils Absolute: 9.6 x10E3/uL — ABNORMAL HIGH (ref 1.4–7.0)
Neutrophils: 72 %
Platelets: 345 x10E3/uL (ref 150–450)
RBC: 4.35 x10E6/uL (ref 3.77–5.28)
RDW: 13.4 % (ref 11.7–15.4)
WBC: 13.4 x10E3/uL — ABNORMAL HIGH (ref 3.4–10.8)

## 2018-10-30 LAB — COMPREHENSIVE METABOLIC PANEL WITH GFR
ALT: 9 IU/L (ref 0–32)
AST: 11 IU/L (ref 0–40)
Albumin/Globulin Ratio: 1.4 (ref 1.2–2.2)
Albumin: 4.2 g/dL (ref 3.8–4.9)
Alkaline Phosphatase: 142 IU/L — ABNORMAL HIGH (ref 39–117)
BUN/Creatinine Ratio: 14 (ref 9–23)
BUN: 43 mg/dL — ABNORMAL HIGH (ref 6–24)
Bilirubin Total: 0.3 mg/dL (ref 0.0–1.2)
CO2: 21 mmol/L (ref 20–29)
Calcium: 9.3 mg/dL (ref 8.7–10.2)
Chloride: 104 mmol/L (ref 96–106)
Creatinine, Ser: 3.07 mg/dL — ABNORMAL HIGH (ref 0.57–1.00)
GFR calc Af Amer: 19 mL/min/1.73 — ABNORMAL LOW
GFR calc non Af Amer: 17 mL/min/1.73 — ABNORMAL LOW
Globulin, Total: 3.1 g/dL (ref 1.5–4.5)
Glucose: 121 mg/dL — ABNORMAL HIGH (ref 65–99)
Potassium: 4.7 mmol/L (ref 3.5–5.2)
Sodium: 140 mmol/L (ref 134–144)
Total Protein: 7.3 g/dL (ref 6.0–8.5)

## 2018-10-31 LAB — MICROALBUMIN / CREATININE URINE RATIO
Creatinine, Urine: 83.2 mg/dL
Microalb/Creat Ratio: 1411 mg/g{creat} — ABNORMAL HIGH (ref 0–29)
Microalbumin, Urine: 1173.7 ug/mL

## 2018-10-31 MED ORDER — ACETAMINOPHEN-CODEINE #3 300-30 MG PO TABS: ORAL_TABLET | ORAL | 1 refills | Status: DC

## 2018-10-31 MED ORDER — AMLODIPINE BESYLATE 5 MG PO TABS: 5.0000 mg | ORAL_TABLET | Freq: Every day | ORAL | 5 refills | Status: DC

## 2018-10-31 MED ORDER — DIPHENOXYLATE-ATROPINE 2.5-0.025 MG PO TABS: 2.0000 | ORAL_TABLET | Freq: Two times a day (BID) | ORAL | 2 refills | Status: DC | PRN

## 2018-11-03 ENCOUNTER — Encounter (HOSPITAL_BASED_OUTPATIENT_CLINIC_OR_DEPARTMENT_OTHER): Payer: Medicaid Other | Attending: Physician Assistant

## 2018-11-03 DIAGNOSIS — Z8673 Personal history of transient ischemic attack (TIA), and cerebral infarction without residual deficits: Secondary | ICD-10-CM | POA: Insufficient documentation

## 2018-11-03 DIAGNOSIS — E1140 Type 2 diabetes mellitus with diabetic neuropathy, unspecified: Secondary | ICD-10-CM | POA: Diagnosis not present

## 2018-11-03 DIAGNOSIS — L97422 Non-pressure chronic ulcer of left heel and midfoot with fat layer exposed: Secondary | ICD-10-CM | POA: Diagnosis not present

## 2018-11-03 DIAGNOSIS — F17210 Nicotine dependence, cigarettes, uncomplicated: Secondary | ICD-10-CM | POA: Insufficient documentation

## 2018-11-03 DIAGNOSIS — E11621 Type 2 diabetes mellitus with foot ulcer: Secondary | ICD-10-CM | POA: Insufficient documentation

## 2018-11-03 DIAGNOSIS — Z794 Long term (current) use of insulin: Secondary | ICD-10-CM | POA: Insufficient documentation

## 2018-11-03 DIAGNOSIS — I10 Essential (primary) hypertension: Secondary | ICD-10-CM | POA: Diagnosis not present

## 2018-11-03 DIAGNOSIS — L97522 Non-pressure chronic ulcer of other part of left foot with fat layer exposed: Secondary | ICD-10-CM | POA: Diagnosis not present

## 2018-11-05 ENCOUNTER — Other Ambulatory Visit: Payer: Self-pay

## 2018-11-10 DIAGNOSIS — L97522 Non-pressure chronic ulcer of other part of left foot with fat layer exposed: Secondary | ICD-10-CM | POA: Diagnosis not present

## 2018-11-10 DIAGNOSIS — E11621 Type 2 diabetes mellitus with foot ulcer: Secondary | ICD-10-CM | POA: Diagnosis not present

## 2018-11-11 ENCOUNTER — Telehealth: Payer: Self-pay | Admitting: *Deleted

## 2018-11-11 ENCOUNTER — Other Ambulatory Visit: Payer: Self-pay | Admitting: Family Medicine

## 2018-11-11 DIAGNOSIS — G8929 Other chronic pain: Secondary | ICD-10-CM

## 2018-11-11 DIAGNOSIS — R252 Cramp and spasm: Secondary | ICD-10-CM

## 2018-11-11 DIAGNOSIS — M545 Low back pain, unspecified: Secondary | ICD-10-CM

## 2018-11-11 DIAGNOSIS — Z89432 Acquired absence of left foot: Secondary | ICD-10-CM

## 2018-11-11 DIAGNOSIS — E1140 Type 2 diabetes mellitus with diabetic neuropathy, unspecified: Secondary | ICD-10-CM

## 2018-11-11 MED ORDER — CYCLOBENZAPRINE HCL 10 MG PO TABS: ORAL_TABLET | ORAL | 0 refills | Status: DC

## 2018-11-11 NOTE — Telephone Encounter (Signed)
Patient called stating that she discussed with provider during her previous visit that she wanted refills for Flexeril but it was not sent to her pharmacy. Per pt, she discuss starting a new script for her pain but provider sent in Tylenol #3 instead. Per pt she would like the script that provider and her discussed during the visit in managing her pain instead of Tylenol #3.

## 2018-11-11 NOTE — Progress Notes (Signed)
Patient ID: [name], female   DOB: 24-Sep-1965, 53 y.o.   MRN: [id number]   Patient left phone message regarding refill of flexeril- I am pretty sure that this medication was refilled at her last visit but new RX resent today. Patient also at last visit expressed that tylenol #3 was no longer as effective at controlling her chronic pain- pain management referral discussed at recent visit and referral will be placed again. Pt will be notified.

## 2018-11-11 NOTE — Telephone Encounter (Signed)
Patient is being referred to pain management regarding her chronic pain but I will refill the flexeril

## 2018-11-12 NOTE — Telephone Encounter (Signed)
Informed patient with what provider stated and she verbalized understanding.  

## 2018-11-15 ENCOUNTER — Other Ambulatory Visit: Payer: Self-pay | Admitting: Internal Medicine

## 2018-11-15 ENCOUNTER — Telehealth: Payer: Self-pay

## 2018-11-15 NOTE — Telephone Encounter (Signed)
Pt contacted the office and stated she has not received her rx for flexeril but is requesting a new rx sent

## 2018-11-16 ENCOUNTER — Other Ambulatory Visit: Payer: Self-pay | Admitting: Family Medicine

## 2018-11-16 DIAGNOSIS — R252 Cramp and spasm: Secondary | ICD-10-CM

## 2018-11-16 MED ORDER — CYCLOBENZAPRINE HCL 10 MG PO TABS: ORAL_TABLET | ORAL | 5 refills | Status: DC

## 2018-11-16 NOTE — Telephone Encounter (Signed)
Spoke with patient and she stated [facility] [address] and they informed her that they still do not have her Flexeril refill. Staff informed patient that script was sent to [facility] and patient stated she did not want it sent to [facility] she [facility]

## 2018-11-16 NOTE — Telephone Encounter (Signed)
Notify patient that RX for flex[facility]il [facility] and please have her call back if RX is not there. If [facility] not [facility] then please call in verbal order for the flexeril as per chart

## 2018-11-16 NOTE — Progress Notes (Signed)
Patient ID: [name], female   DOB: 1965-10-14, 53 y.o.   MRN: [id number]   Received phone message that patient would like her flexeril refill sent to [place]- will send in refill to requested pharmacy

## 2018-11-16 NOTE — Telephone Encounter (Signed)
Called patient and left message informing her with what provider stated and if she is still having issues with getting this medication to please call office back.

## 2018-11-17 ENCOUNTER — Ambulatory Visit: Payer: Medicaid Other | Admitting: Family Medicine

## 2018-11-17 ENCOUNTER — Ambulatory Visit
Admission: RE | Admit: 2018-11-17 | Discharge: 2018-11-17 | Disposition: A | Discharge status: Home / Self Care | Payer: Medicaid Other | Source: Ambulatory Visit | Attending: Internal Medicine | Admitting: Internal Medicine

## 2018-11-17 DIAGNOSIS — I739 Peripheral vascular disease, unspecified: Secondary | ICD-10-CM | POA: Diagnosis not present

## 2018-11-17 DIAGNOSIS — L97509 Non-pressure chronic ulcer of other part of unspecified foot with unspecified severity: Secondary | ICD-10-CM

## 2018-11-17 DIAGNOSIS — I998 Other disorder of circulatory system: Secondary | ICD-10-CM | POA: Diagnosis not present

## 2018-11-17 DIAGNOSIS — E11621 Type 2 diabetes mellitus with foot ulcer: Secondary | ICD-10-CM

## 2018-11-17 DIAGNOSIS — L97522 Non-pressure chronic ulcer of other part of left foot with fat layer exposed: Secondary | ICD-10-CM | POA: Diagnosis not present

## 2018-11-18 ENCOUNTER — Other Ambulatory Visit: Payer: Self-pay | Admitting: Internal Medicine

## 2018-11-18 DIAGNOSIS — I998 Other disorder of circulatory system: Secondary | ICD-10-CM

## 2018-11-18 DIAGNOSIS — I70229 Atherosclerosis of native arteries of extremities with rest pain, unspecified extremity: Secondary | ICD-10-CM

## 2018-11-18 DIAGNOSIS — I129 Hypertensive chronic kidney disease with stage 1 through stage 4 chronic kidney disease, or unspecified chronic kidney disease: Secondary | ICD-10-CM | POA: Diagnosis not present

## 2018-11-18 DIAGNOSIS — N184 Chronic kidney disease, stage 4 (severe): Secondary | ICD-10-CM | POA: Diagnosis not present

## 2018-11-18 DIAGNOSIS — E09621 Drug or chemical induced diabetes mellitus with foot ulcer: Secondary | ICD-10-CM

## 2018-11-18 DIAGNOSIS — L97508 Non-pressure chronic ulcer of other part of unspecified foot with other specified severity: Secondary | ICD-10-CM

## 2018-11-18 DIAGNOSIS — D631 Anemia in chronic kidney disease: Secondary | ICD-10-CM | POA: Diagnosis not present

## 2018-11-18 DIAGNOSIS — N2581 Secondary hyperparathyroidism of renal origin: Secondary | ICD-10-CM | POA: Diagnosis not present

## 2018-11-18 DIAGNOSIS — R350 Frequency of micturition: Secondary | ICD-10-CM | POA: Diagnosis not present

## 2018-11-22 DIAGNOSIS — M129 Arthropathy, unspecified: Secondary | ICD-10-CM | POA: Diagnosis not present

## 2018-11-22 DIAGNOSIS — Z79891 Long term (current) use of opiate analgesic: Secondary | ICD-10-CM | POA: Diagnosis not present

## 2018-11-22 DIAGNOSIS — E559 Vitamin D deficiency, unspecified: Secondary | ICD-10-CM | POA: Diagnosis not present

## 2018-11-22 DIAGNOSIS — G894 Chronic pain syndrome: Secondary | ICD-10-CM | POA: Diagnosis not present

## 2018-11-22 DIAGNOSIS — G546 Phantom limb syndrome with pain: Secondary | ICD-10-CM | POA: Diagnosis not present

## 2018-11-22 DIAGNOSIS — Z79899 Other long term (current) drug therapy: Secondary | ICD-10-CM | POA: Diagnosis not present

## 2018-11-24 ENCOUNTER — Ambulatory Visit: Payer: Medicaid Other | Attending: Family Medicine | Admitting: Family Medicine

## 2018-11-24 ENCOUNTER — Other Ambulatory Visit: Payer: Self-pay

## 2018-11-24 ENCOUNTER — Encounter: Payer: Self-pay | Admitting: Family Medicine

## 2018-11-24 DIAGNOSIS — R110 Nausea: Secondary | ICD-10-CM | POA: Diagnosis not present

## 2018-11-24 DIAGNOSIS — Z89432 Acquired absence of left foot: Secondary | ICD-10-CM | POA: Diagnosis not present

## 2018-11-24 DIAGNOSIS — Z794 Long term (current) use of insulin: Secondary | ICD-10-CM | POA: Diagnosis not present

## 2018-11-24 DIAGNOSIS — E118 Type 2 diabetes mellitus with unspecified complications: Secondary | ICD-10-CM

## 2018-11-24 MED ORDER — NOVOLOG 100 UNIT/ML ~~LOC~~ SOLN: 10.0000 [IU] | Freq: Three times a day (TID) | SUBCUTANEOUS | 11 refills | Status: DC

## 2018-11-24 MED ORDER — PROMETHAZINE HCL 12.5 MG PO TABS: 12.5000 mg | ORAL_TABLET | Freq: Three times a day (TID) | ORAL | 0 refills | Status: DC | PRN

## 2018-11-24 NOTE — Progress Notes (Signed)
Pt. Checked her blood pressure was 138/68. She checked it at home.  Pt. Is currently on antibiotic for her UTI.   Pt. Stated she is taking Novolog 8-12 units every meal 3x a day. She needs a refillf or it.   Pt. Request if she can get Promethazine for nausea.       Virtual Visit via Telephone Note  I connected with [name] on 11/24/18 at  2:50 PM EDT by telephone and verified that I am speaking with the correct person using two identifiers.   I discussed the limitations, risks, security and privacy concerns of performing an evaluation and management service by telephone and the availability of in person appointments. I also discussed with the patient that there may be a patient responsible charge related to this service. The patient expressed understanding and agreed to proceed.  Patient Location: Home Provider Location: [facility] Office Others participating in call: call initiated by Francisco Capuchin, CMA who then transferred the call to me   History of Present Illness:      53 year old diabetic female with recent hemoglobin A1c of 6.7 who is seen in follow-up of recent visit.  She reports that she continues to have a wound on her left stump for which she is seen the wound care center.  She states that she did go to the [facility] after her last visit and her prosthesis will be modified with more padding.  She reports that her blood sugars are remaining well controlled.  She does need a refill of her short acting insulin which she is currently taking between 8 to 12 units pre-meal.  She denies any issues with blurred vision, increased thirst or urinary frequency related to her diabetes which is currently controlled.         She does continue to have some issues with nausea and patient would like prescription for Phenergan refilled.  She denies any actual abdominal pain and no vomiting, constipation or diarrhea.  She denies any issues with chest pain or palpitations, no shortness of  breath or cough, no headaches or dizziness and she has had no fever or chills.  She is currently on an antibiotic for urinary tract infection.   Past Medical History:  Diagnosis Date  . Anxiety 2002  . Chest tightness   . Depression 2001  . Diabetes type 1, uncontrolled ([place])    at age 50  . Diabetic neuropathy ([place])   . Essential hypertension 2015  . GERD (gastroesophageal reflux disease)    about age of 19  . Nausea and vomiting in adult   . Stroke ([place])   . Urinary frequency   . Yeast vaginitis     Past Surgical History:  Procedure Laterality Date  . ACHILLES TENDON SURGERY Left 03/10/2013   Procedure: LEFT CHOPART AMPUTATION/ LEFT TENDON ACHILLES [place];  Surgeon: Wylene Simmer, MD;  Location: [place];  Service: Orthopedics;  Laterality: Left;  . AMPUTATION Left 06/15/2012   Procedure: AMPUTATION DIGIT;  Surgeon: Meredith Pel, MD;  Location: [place];  Service: Orthopedics;  Laterality: Left;  Left great toe revision amputation  . ENDOMETRIAL ABLATION    . LAPAROSCOPIC CHOLECYSTECTOMY  01/30/2015   [facility] day surgery     Family History  Problem Relation Age of Onset  . Diabetes Mother   . Heart attack Mother   . Heart disease Mother        before age 34  . Hypertension Mother   . Hyperlipidemia Mother   .  Diabetes Father   . Heart attack Father   . Heart disease Father        before age 33  . Diabetes Sister   . Hypertension Sister     Social History   Tobacco Use  . Smoking status: Light Tobacco Smoker    Packs/day: 0.25    Years: 22.00    Pack years: 5.50    Types: Cigarettes  . Smokeless tobacco: Never Used  Substance Use Topics  . Alcohol use: Yes    Alcohol/week: 0.0 standard drinks    Comment: sociial  . Drug use: No     Allergies  Allergen Reactions  . Lidocaine Itching  . Latex Rash       Observations/Objective: No vital signs or physical exam conducted as visit was done via telephone  Assessment and Plan: 1. Controlled type 2 diabetes  mellitus with complication, with long-term current use of insulin ([place]); 3. history of Chopart amputation of left foot Patient's recent hemoglobin A1c of 6.7 showed good control of diabetes and she will continue her current treatment regimen along with monitoring and appropriate dietary changes.  Patient reports that she currently uses between 8 to 12 units of NovoLog pre-meal and prescription written for 10 units and patient can adjust as needed.  Patient continues to attend wound care and [facility] as she has developed a wound on her stump status post past Chopart amputation of the left foot. - NOVOLOG 100 UNIT/ML injection; Inject 10 Units into the skin 3 (three) times daily before meals.  Dispense: 10 mL; Refill: 11  2. Nausea Refill provided of Phenergan for patient to take as needed for nausea.  Patient should call or return if she has any worsening of nausea, onset of intractable vomiting, dehydration symptoms, abdominal pain or any concerns - promethazine (PHENERGAN) 12.5 MG tablet; Take 1 tablet (12.5 mg total) by mouth every 8 (eight) hours as needed for nausea or vomiting.  Dispense: 20 tablet; Refill: 0   Follow Up Instructions: 3 to 76-month follow-up of chronic medical issues and as needed   I discussed the assessment and treatment plan with the patient. The patient was provided an opportunity to ask questions and all were answered. The patient agreed with the plan and demonstrated an understanding of the instructions.   The patient was advised to call back or seek an in-person evaluation if the symptoms worsen or if the condition fails to improve as anticipated.  I provided 12 minutes of non-face-to-face time during this encounter.   Antony Blackbird, MD

## 2018-11-27 ENCOUNTER — Encounter: Payer: Self-pay | Admitting: Family Medicine

## 2018-11-30 ENCOUNTER — Encounter: Payer: Self-pay | Admitting: *Deleted

## 2018-11-30 ENCOUNTER — Ambulatory Visit
Admission: RE | Admit: 2018-11-30 | Discharge: 2018-11-30 | Disposition: A | Discharge status: Home / Self Care | Payer: Medicaid Other | Source: Ambulatory Visit | Attending: Internal Medicine | Admitting: Internal Medicine

## 2018-11-30 DIAGNOSIS — I70229 Atherosclerosis of native arteries of extremities with rest pain, unspecified extremity: Secondary | ICD-10-CM

## 2018-11-30 DIAGNOSIS — L97529 Non-pressure chronic ulcer of other part of left foot with unspecified severity: Secondary | ICD-10-CM | POA: Diagnosis not present

## 2018-11-30 DIAGNOSIS — L97508 Non-pressure chronic ulcer of other part of unspecified foot with other specified severity: Secondary | ICD-10-CM

## 2018-11-30 DIAGNOSIS — E09621 Drug or chemical induced diabetes mellitus with foot ulcer: Secondary | ICD-10-CM

## 2018-11-30 DIAGNOSIS — I998 Other disorder of circulatory system: Secondary | ICD-10-CM

## 2018-11-30 HISTORY — PX: IR RADIOLOGIST EVAL & MGMT: IMG5224

## 2018-11-30 NOTE — Consult Note (Signed)
Chief Complaint: Left foot wound  Referring Physician(s): [place]  History of Present Illness: [name] is a 53 y.o. female presenting to [facility] today, kindly referred by Dr. Evette Doffing, for evaluation of candidacy for left lower limb revascularization.   [name] is here today by herself for our interview.    She tells me that for about 2 1/2 months now she has had a wound on the plantar aspect of her left surgical foot.  After 2 weeks she called the wound care clinic for care, so has been going weekly for about 2 months. She says things have been improving.  She is not taking antibiotics currently. She denies any fever, rigors, chills, or drainage.   She complains of bilateral lower extremity pain, she attributes to neuropathy.  She is treated for neuropathy.  I cannot elicit a history of claudication or resting pain.    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  This is the first time since then she has had wound.    She has multiple CV risk factors, including: DM, smoking, HTN, known CVD with stroke, HLD, known vascular disease.   She had stroke in February 2019 treated medically.  She denies prior MI.   ABI and segmental performed 9/16 shows: Right ABI of 1.1 Left ABI of 0.48 Segmental shows evidence of left SFA occlusion.    This is deterioration from a prior ABI of 2014.     She has a 78 year old daughter living at home with her.  She is currently able to do all of daily activities comfortably and capably.  She worked as a Charity fundraiser in the past.  She is not currently on disability.      Past Medical History:  Diagnosis Date  . Anxiety 2002  . Chest tightness   . Depression 2001  . Diabetes type 1, uncontrolled ([place])    at age 12  . Diabetic neuropathy ([employer])   . Essential hypertension 2015  . GERD (gastroesophageal reflux disease)    about age of 57  .  Nausea and vomiting in adult   . Stroke ([place])   . Urinary frequency   . Yeast vaginitis     Past Surgical History:  Procedure Laterality Date  . ACHILLES TENDON SURGERY Left 03/10/2013   Procedure: LEFT CHOPART AMPUTATION/ LEFT TENDON ACHILLES [place];  Surgeon: Wylene Simmer, MD;  Location: [facility];  Service: Orthopedics;  Laterality: Left;  . AMPUTATION Left 06/15/2012   Procedure: AMPUTATION DIGIT;  Surgeon: Meredith Pel, MD;  Location: [facility];  Service: Orthopedics;  Laterality: Left;  Left great toe revision amputation  . ENDOMETRIAL ABLATION    . IR RADIOLOGIST EVAL & MGMT  11/30/2018  . LAPAROSCOPIC CHOLECYSTECTOMY  01/30/2015   [facility]     Allergies: Lidocaine and Latex  Medications: Prior to Admission medications   Medication Sig Start Date End Date Taking? Authorizing Provider  acetaminophen-codeine (TYLENOL #3) 300-30 MG tablet TAKE 1 TABLET EVERY 8 HOURS AS NEEDED FOR MODERATE PAIN (INS WILL ONLY PAY FOR 21) 10/31/18   Fulp, Cammie, MD  alosetron (LOTRONEX) 0.5 MG tablet Take 0.5 mg by mouth 2 (two) times daily. 06/02/17   [provider]  aluminum chloride (DRYSOL) 20 % external solution Apply topically at bedtime. Patient not taking: Reported on 12/17/2017 09/01/16   Boykin Nearing, MD  amLODipine (NORVASC) 5 MG tablet Take 1 tablet (  5 mg total) by mouth daily. 10/31/18   Fulp, Cammie, MD  aspirin EC 81 MG EC tablet Take 1 tablet (81 mg total) by mouth daily. 04/11/17   Domenic Polite, MD  atorvastatin (LIPITOR) 40 MG tablet TAKE 1 TABLET (40 MG TOTAL) BY MOUTH DAILY AT 6 PM. 09/07/18   Charlott Rakes, MD  Blood Glucose Monitoring Suppl (ACCU-CHEK AVIVA PLUS) w/Device KIT CHECK BLOOD SUGARS THREE TIMES A DAY.  DX:250.03 10/06/17   Fulp, Cammie, MD  calcium citrate-vitamin D 500-400 MG-UNIT chewable tablet Chew 1 tablet by mouth 2 (two) times daily. 05/09/14   Funches, Adriana Mccallum, MD  Cholecalciferol ([facility] VIT D 5000 HIGH-POTENCY) 5000 units capsule Take 5,000 Units  by mouth daily.    [provider]  ciprofloxacin (CIPRO) 500 MG tablet Take 500 mg by mouth 1 day or 1 dose.    [provider]  cyclobenzaprine (FLEXERIL) 10 MG tablet TAKE 1 TABLET AT BEDTIME AS NEEDED FOR MUSCLE SPASM 11/16/18   Fulp, Cammie, MD  DEXILANT 60 MG capsule Take 60 mg by mouth daily. 06/09/17   [provider]  diphenoxylate-atropine (LOMOTIL) 2.5-0.025 MG tablet Take 2 tablets by mouth 2 (two) times daily as needed for diarrhea or loose stools. 10/31/18   Fulp, Cammie, MD  DULoxetine (CYMBALTA) 60 MG capsule Take 1 cap by mouth per day. Must keep appt next week for more refills. 10/18/18   Fulp, Cammie, MD  fluticasone (FLONASE) 50 MCG/ACT nasal spray Place 2 sprays into both nostrils daily. 10/18/18   Fulp, Cammie, MD  glucose blood (ACCU-CHEK AVIVA PLUS) test strip USE TO CHECK BLOOD SUGAR BEFORE MEALS AND AT BEDTIME dx code E10.65 10/06/17   Fulp, Cammie, MD  hydrALAZINE (APRESOLINE) 50 MG tablet Take 1 tablet (50 mg total) by mouth 3 (three) times daily. 10/06/17 10/29/18  Fulp, Cammie, MD  Insulin Glargine (LANTUS SOLOSTAR) 100 UNIT/ML Solostar Pen INJECT 50 UNITS SUBCUTANEOUSLY EVERY DAY 08/26/18   Fulp, Cammie, MD  insulin lispro (HUMALOG KWIKPEN) 100 UNIT/ML KiwkPen Inject 0.13 mLs (13 Units total) into the skin 3 (three) times daily. Patient not taking: Reported on 11/24/2018 11/04/17   Charlott Rakes, MD  NOVOLOG 100 UNIT/ML injection Inject 10 Units into the skin 3 (three) times daily before meals. 11/24/18   Fulp, Cammie, MD  promethazine (PHENERGAN) 12.5 MG tablet Take 1 tablet (12.5 mg total) by mouth every 8 (eight) hours as needed for nausea or vomiting. 11/24/18   Fulp, Cammie, MD  promethazine (PHENERGAN) 25 MG tablet Take 1 tablet (25 mg total) by mouth every 8 (eight) hours as needed for nausea or vomiting. Patient not taking: Reported on 11/24/2018 10/03/16   Boykin Nearing, MD  topiramate (TOPAMAX) 25 MG tablet Take 1 tablet (25 mg total) by mouth 2  (two) times daily. Patient not taking: Reported on 05/10/2018 06/25/17   Garvin Fila, MD  zolpidem (AMBIEN) 5 MG tablet TAKE ONE TABLET BY MOUTH ONCE DAILY AT BEDTIME AS NEEDED FOR SLEEP 05/10/18   Charlott Rakes, MD     Family History  Problem Relation Age of Onset  . Diabetes Mother   . Heart attack Mother   . Heart disease Mother        before age 3  . Hypertension Mother   . Hyperlipidemia Mother   . Diabetes Father   . Heart attack Father   . Heart disease Father        before age 2  . Diabetes Sister   . Hypertension Sister  Social History   Socioeconomic History  . Marital status: Divorced    Spouse name: Not on file  . Number of children: 1  . Years of education: Not on file  . Highest education level: Not on file  Occupational History    Employer: [employer]  . Financial resource strain: Not on file  . Food insecurity    Worry: Not on file    Inability: Not on file  . Transportation needs    Medical: Not on file    Non-medical: Not on file  Tobacco Use  . Smoking status: Light Tobacco Smoker    Packs/day: 0.25    Years: 22.00    Pack years: 5.50    Types: Cigarettes  . Smokeless tobacco: Never Used  Substance and Sexual Activity  . Alcohol use: Yes    Alcohol/week: 0.0 standard drinks    Comment: sociial  . Drug use: No  . Sexual activity: Yes    Partners: Male  Lifestyle  . Physical activity    Days per week: Not on file    Minutes per session: Not on file  . Stress: Not on file  Relationships  . Social Herbalist on phone: Not on file    Gets together: Not on file    Attends religious service: Not on file    Active member of club or organization: Not on file    Attends meetings of clubs or organizations: Not on file    Relationship status: Not on file  Other Topics Concern  . Not on file  Social History Narrative   Lives at home with her daughter   Right handed   Caffeine: 3 cups daily      Review of Systems:  A 12 point ROS discussed and pertinent positives are indicated in the HPI above.  All other systems are negative.  Review of Systems  Vital Signs: BP (!) 144/62 (BP Location: Right Arm)   Pulse 62   Temp 97.6 F (36.4 C) (Temporal)   SpO2 98%   Physical Exam General: 53 yo female appearing stated age.  Well-developed, well-nourished.  No distress. HEENT: Atraumatic, normocephalic.  Conjugate gaze, extra-ocular motor intact. No scleral icterus or scleral injection. No lesions on external ears, nose, lips, or gums.  Oral mucosa moist, pink.  Neck: Symmetric with no goiter enlargement.  Chest/Lungs:  Symmetric chest with inspiration/expiration.  No labored breathing.  Clear to auscultation with no wheezes, rhonchi, or rales.  Heart:  RRR, with no third heart sounds appreciated. No JVD appreciated.  Abdomen:  Soft, NT/ND, with + bowel sounds.   Genito-urinary: Deferred Neurologic: Alert & Oriented to person, place, and time.   Normal affect and insight.  Appropriate questions.  Moving all 4 extremities with gross sensory intact.  Pulse Exam:  No bruit appreciated.   Palpable right CFA & left CFA.  Doppler positive right AT & PT.  Doppler positive left AT & PT, though signal is decreased Extremities: No wound on the right.  Surgical site of prior chopart on the left.  There is dry flaking skin on the plantar aspect, with local wound and no erythema.      Mallampati Score:     Imaging: US Arterial Seg Multiple Le (abi, Segmental Pressures, Pvr's)  Result Date: 11/17/2018 CLINICAL DATA:  53 year old female with a history of diabetic foot ulcer EXAM: NONINVASIVE PHYSIOLOGIC VASCULAR STUDY OF BILATERAL LOWER EXTREMITIES TECHNIQUE: Evaluation of both lower extremities was performed at  rest, including calculation of ankle-brachial indices, multiple segmental pressure evaluation, segmental Doppler and segmental pulse volume recording. COMPARISON:  None. FINDINGS: Right: Resting ankle brachial  index:  1.06 Segmental blood pressure: Symmetric upper extremity pressures. Appropriate increased to the thigh with noncompressible high-thigh segment. Doppler: Segmental Doppler demonstrates triphasic waveforms throughout Pulse volume recording: Segmental PVR maintained on the left Left: Resting ankle brachial index: 0.48 Segmental blood pressure: Symmetric upper extremity pressures. Segmental drop from the thigh to the below knee segment. Doppler: Segmental Doppler demonstrates triphasic femoral artery waveform with monophasic popliteal artery, posterior tibial artery, and dorsalis pedis waveform. Pulse volume recording: Segmental PVR demonstrates amplitude maintained in the thigh with loss of amplitude and quality below knee and at the ankle segment. Flat line at the left great toe. Additional: IMPRESSION: Right: Resting ABI within normal limits with the segmental exam demonstrating no significant arterial occlusive disease. Left: Resting ABI in the moderate range arterial occlusive disease, with segmental exam demonstrating SFA occlusion. Signed, Dulcy Fanny. Dellia Nims, RPVI [facility] Radiology Electronically Signed   By: Corrie Mckusick D.O.   On: 11/17/2018 15:54   Ir Radiologist Eval & Mgmt  Result Date: 11/30/2018 Please refer to notes tab for details about interventional procedure. (Op Note)   Labs:  CBC: Recent Labs    10/29/18 1439  WBC 13.4*  HGB 12.0  HCT 36.6  PLT 345    COAGS: No results for input(s): INR, APTT in the last 8760 hours.  BMP: Recent Labs    12/17/17 1642 10/29/18 1439  NA 143 140  K 4.9 4.7  CL 105 104  CO2 21 21  GLUCOSE 81 121*  BUN 35* 43*  CALCIUM 8.9 9.3  CREATININE 2.67* 3.07*  GFRNONAA 20* 17*  GFRAA 23* 19*    LIVER FUNCTION TESTS: Recent Labs    10/29/18 1439  BILITOT 0.3  AST 11  ALT 9  ALKPHOS 142*  PROT 7.3  ALBUMIN 4.2    TUMOR MARKERS: No results for input(s): AFPTM, CEA,  CA199, CHROMGRNA in the last 8760 hours.  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   Non-invasive lower extremity exam and imaging work-up shows evidence of fem-pop and tibial disease.    I had a lengthy discussion with her regarding anatomy, pathology/pathophysiology, natural history, and prognosis of PAD/CLI, in particular the meaning of her DM and smoking as co-risk factors portending poor prognosis for future limb-salvage in this setting. The indications for intervention and maximal medical treatment supported by updated guidelines1, 2 were discussed.  I was forthcoming that while she is not at imminent risk of amputation, she is at high risk in the future given her risk factors, history, non-invasive results, and current wound. My recommendation would be for early intervention to improve blood flow. Endovascular options were discussed.   Specific risks discussed include: bleeding, infection, contrast reaction, renal injury/nephropathy, arterial injury/dissection, need for additional procedure/surgery, worsening symptoms/tissue including limb loss, cardiopulmonary collapse, death.    She is hesitant to proceed with intervention, and does not wish to undergo intervention at this time.  She tells me she has some issues to deal with including an upcoming relocation.  She has elected to proceed with medical management.      Regarding medical management, maximal medical therapy for reduction of risk factors is indicated as recommended by updated AHA guidelines1.  This includes anti-platelet medication, tight blood glucose control to a HbA1c < 7, tight blood pressure control, maximum-dose HMG-CoA reductase inhibitor, and smoking cessation.  Smoking cessation was addressed, with strategies including counseling and nicotine replacement.  She tells me that she smoked as much as 2  ppd in the past, and currently is smoking 1/2 pack per day now.     Annual flu vaccination is also recommended, with Class 1 recommendation1.   Plan: - At this time she would like to defer treatment.  We will schedule a 3 month follow up wound check in the clinic.  If she needs [place] in the interval, or if her wound care doctors feel like she has plateau or regression, we will discuss therapy.   -Recommend maximal medical therapy for cardiovascular risk reduction, including continuing anti-platelet therapy.  -Recommend initiating smoking cessation measures, with options discussed.   -Annual flu vaccination is recommended in the setting of known PAD, in the absence of contra-indications.    ___________________________________________________________________   1Morley Kos MD, et al. 2016 AHA/ACC Guideline on the Management of Patients With Lower Extremity Peripheral Artery Disease: Executive Summary: A Report of the American College of Cardiology/American Heart Association Task Force on Clinical Practice Guidelines. J Am Coll Cardiol. 2017 Mar 21;69(11):1465-1508. doi: 10.1016/j.jacc.2016.11.008.   2 - Norgren L, et al. TASC II Working Group. Inter-society consensus for the management of peripheral arterial disease. Int Tressia Miners. 2007 Jun;26(2):81-157. Review. PubMed PMID: 82417530  3 - Hingorani A, et al. The management of diabetic foot: A clinical practice guideline by the Society for Vascular Surgery in collaboration with the [place] and the Society  for Vascular Medicine. J Vasc Surg. 2016 Feb;63(2 Suppl):3S-21S. doi: 10.1016/j.jvs.2015.10.003. PubMed PMID: 10404591.   Thank you for this interesting consult.  I greatly enjoyed meeting ASHONTE ANGELUCCI and look forward to participating in their care.  A copy of this report was sent to the requesting provider on this date.  Electronically Signed: Corrie Mckusick 11/30/2018, 2:36 PM   I spent a total of  60  Minutes   in face to face in clinical consultation, greater than 50% of which was counseling/coordinating care for PAD, left foot wound CLI/CLTI, possible intervention for limb salvage.

## 2018-12-01 ENCOUNTER — Other Ambulatory Visit: Payer: Self-pay | Admitting: Pharmacist

## 2018-12-01 ENCOUNTER — Telehealth: Payer: Self-pay | Admitting: Family Medicine

## 2018-12-01 ENCOUNTER — Other Ambulatory Visit: Payer: Self-pay | Admitting: Family Medicine

## 2018-12-01 DIAGNOSIS — Z794 Long term (current) use of insulin: Secondary | ICD-10-CM

## 2018-12-01 DIAGNOSIS — E118 Type 2 diabetes mellitus with unspecified complications: Secondary | ICD-10-CM

## 2018-12-01 DIAGNOSIS — L97522 Non-pressure chronic ulcer of other part of left foot with fat layer exposed: Secondary | ICD-10-CM | POA: Diagnosis not present

## 2018-12-01 DIAGNOSIS — E11621 Type 2 diabetes mellitus with foot ulcer: Secondary | ICD-10-CM | POA: Diagnosis not present

## 2018-12-01 MED ORDER — TRUEPLUS PEN NEEDLES 32G X 4 MM MISC: 11 refills | Status: DC

## 2018-12-01 MED ORDER — NOVOLOG FLEXPEN 100 UNIT/ML ~~LOC~~ SOPN: 10.0000 [IU] | PEN_INJECTOR | Freq: Three times a day (TID) | SUBCUTANEOUS | 1 refills | Status: DC

## 2018-12-01 NOTE — Telephone Encounter (Signed)
I added pen needles.

## 2018-12-01 NOTE — Telephone Encounter (Signed)
Refills sent for pen.

## 2018-12-01 NOTE — Telephone Encounter (Signed)
New Message  Pt states that she has been trying to get her medication corrected for the past week. She says she is suppose to have the Novolog flex pen and she uses [facility] on [address].

## 2018-12-01 NOTE — Telephone Encounter (Signed)
Thank you. Did you include refill of pen needles?

## 2018-12-01 NOTE — Progress Notes (Signed)
Refills sent

## 2018-12-02 ENCOUNTER — Telehealth: Payer: Self-pay | Admitting: Family Medicine

## 2018-12-02 NOTE — Telephone Encounter (Signed)
Patient called stating that her pharmacy does not have the novolog flexpen. Patient was informed pen and needles were sent yesterday and the atorvastatin was also sent over patient states she will be verifying with the pharmacy

## 2018-12-06 DIAGNOSIS — I129 Hypertensive chronic kidney disease with stage 1 through stage 4 chronic kidney disease, or unspecified chronic kidney disease: Secondary | ICD-10-CM | POA: Diagnosis not present

## 2018-12-06 DIAGNOSIS — G546 Phantom limb syndrome with pain: Secondary | ICD-10-CM | POA: Diagnosis not present

## 2018-12-06 DIAGNOSIS — N184 Chronic kidney disease, stage 4 (severe): Secondary | ICD-10-CM | POA: Diagnosis not present

## 2018-12-06 DIAGNOSIS — I10 Essential (primary) hypertension: Secondary | ICD-10-CM | POA: Diagnosis not present

## 2018-12-06 DIAGNOSIS — Z79899 Other long term (current) drug therapy: Secondary | ICD-10-CM | POA: Diagnosis not present

## 2018-12-06 DIAGNOSIS — G894 Chronic pain syndrome: Secondary | ICD-10-CM | POA: Diagnosis not present

## 2018-12-06 DIAGNOSIS — R350 Frequency of micturition: Secondary | ICD-10-CM | POA: Diagnosis not present

## 2018-12-07 ENCOUNTER — Other Ambulatory Visit: Payer: Self-pay | Admitting: Family Medicine

## 2018-12-07 DIAGNOSIS — G4700 Insomnia, unspecified: Secondary | ICD-10-CM

## 2018-12-08 ENCOUNTER — Encounter (HOSPITAL_BASED_OUTPATIENT_CLINIC_OR_DEPARTMENT_OTHER): Payer: Medicaid Other | Attending: Physician Assistant | Admitting: Physician Assistant

## 2018-12-08 ENCOUNTER — Other Ambulatory Visit (HOSPITAL_COMMUNITY)
Admission: RE | Admit: 2018-12-08 | Discharge: 2018-12-08 | Disposition: A | Discharge status: Home / Self Care | Payer: Medicaid Other | Source: Other Acute Inpatient Hospital | Attending: Physician Assistant | Admitting: Physician Assistant

## 2018-12-08 ENCOUNTER — Other Ambulatory Visit: Payer: Self-pay

## 2018-12-08 DIAGNOSIS — L97522 Non-pressure chronic ulcer of other part of left foot with fat layer exposed: Secondary | ICD-10-CM | POA: Diagnosis not present

## 2018-12-08 DIAGNOSIS — L03116 Cellulitis of left lower limb: Secondary | ICD-10-CM | POA: Diagnosis not present

## 2018-12-08 DIAGNOSIS — E11621 Type 2 diabetes mellitus with foot ulcer: Secondary | ICD-10-CM | POA: Insufficient documentation

## 2018-12-08 DIAGNOSIS — F17210 Nicotine dependence, cigarettes, uncomplicated: Secondary | ICD-10-CM | POA: Diagnosis not present

## 2018-12-08 DIAGNOSIS — L97525 Non-pressure chronic ulcer of other part of left foot with muscle involvement without evidence of necrosis: Secondary | ICD-10-CM | POA: Insufficient documentation

## 2018-12-08 DIAGNOSIS — E1140 Type 2 diabetes mellitus with diabetic neuropathy, unspecified: Secondary | ICD-10-CM | POA: Insufficient documentation

## 2018-12-08 DIAGNOSIS — I10 Essential (primary) hypertension: Secondary | ICD-10-CM | POA: Diagnosis not present

## 2018-12-08 DIAGNOSIS — T8744 Infection of amputation stump, left lower extremity: Secondary | ICD-10-CM | POA: Diagnosis not present

## 2018-12-08 NOTE — Progress Notes (Addendum)
[name], [name] ([name]) Visit Report for 12/08/2018 Chief Complaint Document Details Patient Name: Date of Service: [name], [name] 12/08/2018 1:45 PM Medical Record [id number] Patient Account Number: [id number] Date of Birth/Sex: Treating RN: [birth year] (53 y.o. Elam Dutch Primary Care Provider: Antony Blackbird Other Clinician: Referring Provider: Treating Provider/Extender:Stone III, Jillyn Hidden, Cammie Weeks in Treatment: 8 Information Obtained from: Patient Chief Complaint LLE Wound Electronic Signature(s) Signed: 12/08/2018 2:38:19 PM By: Worthy Keeler PA-C Entered By: Worthy Keeler on 12/08/2018 14:38:19 -------------------------------------------------------------------------------- HPI Details Patient Name: Date of Service: [name], [name] 12/08/2018 1:45 PM Medical Record [id number] Patient Account Number: [id number] Date of Birth/Sex: Treating RN: 1965/10/19 (53 y.o. 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, she had recently started using a left foot prosthesis that would allow her to ambulate prior to that she was using an orthopedic shoe on the left, and over the past few weeks she has noticed that her left foot area on the plantar surface was becoming dry with keratinization, and last week noticed that she had a wound on the plantar aspect Patient states that her blood sugars are reasonably controlled, she takes insulin, she smokes about half pack a day and has been cutting down trying to quit Patient has been applying triple antibiotic cream to this area Vascular studies today in the  clinic ABI 0.67 on both sides 10/20/2018 on evaluation today patient appears to be doing really about the same based on what I am seeing compared to what was going on last week. There is a little bit more callus buildup to clean away at this time. Fortunately there is no signs of active infection at this time. No fever chills noted with that being said the patient tells me that this has been open for 2-3 weeks although she was developing callus since she first got the prosthesis 4 months ago. She really has not experienced any signs or symptoms of infection which is good news nonetheless she tells me that prior to having the prosthesis she was actually just using a cam walker boot and did very well with that she did not note any callus buildup when she was utilizing that. Nonetheless I think that may be something to consider at this point as well. 10/27/2018 on evaluation today patient actually appears to be doing a little bit better with regard to her plantar foot ulcer. With that being said she is having a quite a bit of callus buildup around this area I think that is going to need some sharp debridement today. I did discuss this with her and I think that that will help her with healing. 11/03/2018 upon evaluation today patient actually appears to be doing quite well with regard to her plantar foot wound on the left foot. 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 Since the last time I have seen her she is actually gotten a new insert for her shoe for both sides she states this actually feels much better. The good news is I do not see as much callus buildup in the surface of the wound bed actually appears to be doing much better this week compared to last week. I am not sure if this is just  coincidence or truly something that is that much different with the initiation of the new inserts. I am hoping the inserts are doing the job. 11/10/2018 on evaluation today patient appears to be doing about the same with  regard to her left plantar foot. She has not yet called to schedule her vascular appointment at this time. Unfortunately her ABIs here in the clinic were rather poor this has me definitely concerned and I think she needs to have this scheduled ASAP. Again I been avoiding any aggressive debridement secondary to this. Nonetheless I have been trying to clear away some of the edges to try to allow for some healing although I think on top of the poor vascular flow the secondary issue is also pressure getting to the area. 11/17/2018 on evaluation today patient unfortunately appears to be doing worse in regard to her wound bed. She has been tolerating the dressing changes without complication unfortunately. She actually appears to be that of having issues with poor vascular flow to her left lower extremity. Her right seems to be doing excellent on the left her vascular flow is around 0.48 for her ABI which is obviously low. 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 Subsequently I think this does necessitate some type of intervention in order to improve her flow at this point. Subsequently also think that she may benefit from [place] going ahead and seeing about getting her a knee scooter in order to completely offload the foot in order to allow this to heal as well and not develop additional callus buildup and issues I am afraid if were not careful in this regard as I discussed with her multiple times since I began seeing her back in August I think she is going to be at risk for potentially having a further amputation. 12/01/2018 on evaluation today patient did see the interventional radiologist to discuss angioplasty. With that being said she has a lot going on at this time and recommended that she hold off on any type of intervention until after she can complete a move. She would like to give this a few weeks before proceeding down that road  if she is going end up having to go that way. With that being said she was unable to get the knee scooter either because she mainly has carpet in her home therefore they told her really what work out too well for her. She tells me she has been trying to stay off of it is much as possible and again it does look like she does have some improvement in this regard as far as the overall appearance of the wound is concerned. Nonetheless I am still concerned about the blood flow being sufficient to allow for appropriate healing at this time. 12/08/2018 unfortunately upon evaluation today the patient has not currently been doing well. She is having increased pain and drainage. She also has unfortunately a small opening on the roughly 3:00 location when looking at the bottom of her foot which does probe down to just above bone I do not hit the bone directly but there is very little tissue between my probe and the bone. Subsequently this is something new that we have not seen until today and that does have me very concerned about the possibility of osteomyelitis. The patient's foot is also significantly red compared to last time I saw her. Electronic Signature(s) Signed: 12/08/2018 3:33:25 PM By: Melburn Hake,  Margarita Grizzle PA-C Entered By: Worthy Keeler on 12/08/2018 15:33:25 -------------------------------------------------------------------------------- Physical Exam Details Patient Name: Date of Service: [name], [name] 12/08/2018 1:45 PM Medical Record [id number] Patient Account Number: [id number] Date of Birth/Sex: Treating RN: 11/03/1965 (53 y.o. Elam Dutch Primary Care Provider: Antony Blackbird Other Clinician: Referring Provider: Treating Provider/Extender:Stone III, Jillyn Hidden, Cammie Weeks in Treatment: 8 Constitutional Well-nourished and well-hydrated in no acute distress. Respiratory normal breathing without difficulty. clear to auscultation bilaterally. Cardiovascular regular rate  and rhythm with normal S1, S2. Psychiatric this patient is able to make decisions and demonstrates good insight into disease process. Alert and Oriented x 3. pleasant and cooperative. Notes Upon inspection today patient's wound unfortunately does not appear to be any smaller. She still been tolerating the dressing changes without complication. Fortunately there is no evidence of active infection at this time. She still has not undergone the revascularization she was trying to wait until December based on what the other physician told her but again my concern is this does not seem to be healing she still continuing to at least partial weightbearing on this which is also not doing her any good in my opinion. Unfortunately she could not get the knee scooter she tells me because her house is mostly carpet I really think she needs to have some way to completely offload this wound. A total contact cast is not appropriate due to the fact that she does not have the front of her foot. Electronic Signature(s) Signed: 12/08/2018 3:34:18 PM By: Worthy Keeler PA-C Entered By: Worthy Keeler on 12/08/2018 15:34:18 -------------------------------------------------------------------------------- Physician Orders Details Patient Name: Date of Service: [name], [name] 12/08/2018 1:45 PM Medical Record [id number] Patient Account Number: [id number] Date of Birth/Sex: Treating RN: Mar 23, 1965 (53 y.o. 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 Other: - Go to emergency room for evaluation of infected left foot diabetic foot ulcer Dressing Change Frequency Wound #6 Left,Plantar Foot Change Dressing every other day. Skin Barriers/Peri-Wound Care Skin Prep Wound Cleansing May shower and wash wound with soap and water. Primary Wound Dressing Wound #6 Left,Plantar Foot Silver Collagen - moisten with saline Secondary Dressing Wound #6 Left,Plantar Foot Drawtex - cut to fit inside wound edges Foam Border Other: - felt callous pad Off-Loading Removable cast walker boot to: - left foot Other: - knee scooter left leg, minimal weight bearing left foot Laboratory Bacteria identified in Unspecified specimen by Anaerobe culture (MICRO) - left foot LOINC Code: [id number] Convenience Name: Anerobic culture Patient Medications Allergies: lidocaine, latex Notifications Medication Indication Start End Augmentin 12/15/2018 DOSE 1 - oral 875 mg-125 mg tablet - 1 tablet oral taken 2 times a day for 15 days or until follow-up with infectious disease Electronic Signature(s) Signed: 12/15/2018 11:08:15 AM By: Worthy Keeler PA-C Previous Signature: 12/09/2018 12:09:12 PM Version By: Baruch Gouty RN, BSN Previous Signature: 12/12/2018 10:37:03 PM Version By: Worthy Keeler PA-C Entered By: Worthy Keeler on 12/15/2018 11:08:13 -------------------------------------------------------------------------------- Problem List Details Patient Name: Date of Service: [name] 12/08/2018 1:45 PM Medical Record [id number] Patient Account Number: [id number] Date of Birth/Sex: Treating RN:  [birth year] (53 y.o. Elam Dutch Primary Care Provider: Antony Blackbird Other Clinician: Referring Provider: Treating Provider/Extender:Stone III, Jillyn Hidden, Cammie Weeks in Treatment: 8 Active Problems ICD-10 Evaluated Encounter Code Description Active Date Today Diagnosis L97.525 Non-pressure  chronic ulcer of other part of left foot 10/12/2018 No Yes with muscle involvement without evidence of necrosis E08.9 Diabetes mellitus due to underlying condition without [id number] No Yes complications [id number] Type 2 diabetes mellitus with foot ulcer 10/12/2018 No Yes Inactive Problems Resolved Problems Electronic Signature(s) Signed: 12/08/2018 2:38:13 PM By: Worthy Keeler PA-C Entered By: Worthy Keeler on 12/08/2018 14:38:13 -------------------------------------------------------------------------------- Progress Note Details Patient Name: Date of Service: [name], [name] 12/08/2018 1:45 PM Medical Record [id number] Patient Account Number: [id number] Date of Birth/Sex: Treating RN: 1965/08/12 (53 y.o. 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, she had recently started using a left foot prosthesis that would allow her to ambulate prior to that she was using an orthopedic shoe on the left, and over the past few weeks she has noticed that her left foot area on the plantar surface was becoming dry with keratinization, and last week noticed that she had a wound on the plantar aspect Patient states that her blood sugars are reasonably controlled, she takes insulin, she smokes about half pack a day and has been cutting down trying to quit Patient has been applying triple antibiotic cream to this area Vascular studies today in the clinic ABI 0.67 on both sides 10/20/2018 on evaluation today patient appears to be doing really about the same based on what I am  seeing compared to what was going on last week. There is a little bit more callus buildup to clean away at this time. Fortunately there is no signs of active infection at this time. No fever chills noted with that being said the patient tells me that this has been open for 2-3 weeks although she was developing callus since she first got the prosthesis 4 months ago. She really has not experienced any signs or symptoms of infection which is good news nonetheless she tells me that prior to having the prosthesis she was actually just using a cam walker boot and did very well with that she did not note any callus buildup when she was utilizing that. Nonetheless I think that may be something to consider at this point as well. 10/27/2018 on evaluation today patient actually appears to be doing a little bit better with regard to her plantar foot ulcer. With that being said she is having a quite a bit of callus buildup around this area I think that is going to need some sharp debridement today. I did discuss this with her and I think that that will help her with healing. 11/03/2018 upon evaluation today patient actually appears to be doing quite well with regard to her plantar foot wound on the left foot. 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 Since the last time I have seen her she is actually gotten a new insert for her shoe for both sides she states this actually feels much better. The good news is I do not see as much callus buildup in the surface  of the wound bed actually appears to be doing much better this week compared to last week. I am not sure if this is just coincidence or truly something that is that much different with the initiation of the new inserts. I am hoping the inserts are doing the job. 11/10/2018 on evaluation today patient appears to be doing about the same with regard to her left plantar foot. She has not yet called to schedule her vascular appointment at this time. Unfortunately her ABIs  here in the clinic were rather poor this has me definitely concerned and I think she needs to have this scheduled ASAP. Again I been avoiding any aggressive debridement secondary to this. Nonetheless I have been trying to clear away some of the edges to try to allow for some healing although I think on top of the poor vascular flow the secondary issue is also pressure getting to the area. 11/17/2018 on evaluation today patient unfortunately appears to be doing worse in regard to her wound bed. She has been tolerating the dressing changes without complication unfortunately. She actually appears to be that of having issues with poor vascular flow to her left lower extremity. Her right seems to be doing excellent on the left her vascular flow is around 0.48 for her ABI which is obviously low. 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 Subsequently I think this does necessitate some type of intervention in order to improve her flow at this point. Subsequently also think that she may benefit from [place] going ahead and seeing about getting her a knee scooter in order to completely offload the foot in order to allow this to heal as well and not develop additional callus buildup and issues I am afraid if were not careful in this regard as I discussed with her multiple times since I began seeing her back in August I think she is going to be at risk for potentially having a further amputation. 12/01/2018 on evaluation today patient did see the interventional radiologist to discuss angioplasty. With that being said she has a lot going on at this time and recommended that she hold off on any type of intervention until after she can complete a move. She would like to give this a few weeks before proceeding down that road if she is going end up having to go that way. With that being said she was unable to get the knee scooter either because  she mainly has carpet in her home therefore they told her really what work out too well for her. She tells me she has been trying to stay off of it is much as possible and again it does look like she does have some improvement in this regard as far as the overall appearance of the wound is concerned. Nonetheless I am still concerned about the blood flow being sufficient to allow for appropriate healing at this time. 12/08/2018 unfortunately upon evaluation today the patient has not currently been doing well. She is having increased pain and drainage. She also has unfortunately a small opening on the roughly 3:00 location when looking at the bottom of her foot which does probe down to just above bone I do not hit the bone directly but there is very little tissue between my probe and the bone. Subsequently this is something new that we have not seen until today and that does have me very concerned about the possibility  of osteomyelitis. The patient's foot is also significantly red compared to last time I saw her. Patient History Information obtained from Patient. Family History Diabetes - Mother,Father, Heart Disease - Mother,Father, Hypertension - Mother,Father, Kidney Disease - Mother,Father, Thyroid Problems - Mother, No family history of Cancer, Hereditary Spherocytosis, Lung Disease, Seizures, Stroke, Tuberculosis. Social History Current every day smoker - 1/2 ppd, Marital Status - Divorced, Alcohol Use - Never, Drug Use - No History, Caffeine Use - Daily - tea. Medical History Ear/Nose/Mouth/Throat Denies history of Chronic sinus problems/congestion, Middle ear problems Cardiovascular Patient has history of Hypertension Denies history of Hypotension Endocrine Patient has history of Type II Diabetes Denies history of Type I Diabetes Genitourinary Denies history of End Stage Renal Disease Integumentary (Skin) Denies history of History of Burn Musculoskeletal Patient has history of  Osteomyelitis - hx left foot Denies history of Gout, Rheumatoid Arthritis, Osteoarthritis Neurologic Patient has history of Neuropathy Denies history of Dementia, Quadriplegia, Paraplegia, Seizure Disorder Oncologic Denies history of Received Chemotherapy, Received Radiation Psychiatric Patient has history of Confinement Anxiety Denies history of Anorexia/bulimia Hospitalization/Surgery History - achilles tendon lengthening left foot. - left foot amputation. - cholecystectomy. - endometrial ablation. Medical And Surgical History Notes Gastrointestinal GERD, IBSD Neurologic fibromyalgia, CVA Psychiatric anxiety, depression Review of Systems (ROS) Constitutional Symptoms (General Health) Denies complaints or symptoms of Fatigue, Fever, Chills, Marked Weight Change. Respiratory Denies complaints or symptoms of Chronic or frequent coughs, Shortness of Breath. Cardiovascular Denies complaints or symptoms of Chest pain. Psychiatric Denies complaints or symptoms of Claustrophobia, Suicidal. Objective Constitutional Well-nourished and well-hydrated in no acute distress. Vitals Time Taken: 2:22 PM, Height: 70 in, Weight: 210 lbs, BMI: 30.1, Temperature: 98.9 F, Pulse: 68 bpm, Respiratory Rate: 18 breaths/min, Blood Pressure: 130/60 mmHg. Respiratory normal breathing without difficulty. clear to auscultation bilaterally. Cardiovascular regular rate and rhythm with normal S1, S2. Psychiatric this patient is able to make decisions and demonstrates good insight into disease process. Alert and Oriented x 3. pleasant and cooperative. General Notes: Upon inspection today patient's wound unfortunately does not appear to be any smaller. She still been tolerating the dressing changes without complication. Fortunately there is no evidence of active infection at this time. She still has not undergone the revascularization she was trying to wait until December based on what the other physician  told her but again my concern is this does not seem to be healing she still continuing to at least partial weightbearing on this which is also not doing her any good in my opinion. Unfortunately she could not get the knee scooter she tells me because her house is mostly carpet I really think she needs to have some way to completely offload this wound. A total contact cast is not appropriate due to the fact that she does not have the front of her foot. Integumentary (Hair, Skin) Wound #6 status is Open. Original cause of wound was Gradually Appeared. The wound is located on the [place]. The wound measures 2.5cm length x 2.3cm width x 1.5cm depth; 4.516cm^2 area and 6.774cm^3 volume. There is Fat Layer (Subcutaneous Tissue) Exposed exposed. There is no tunneling or undermining noted. There is a small amount of sanguinous drainage noted. The wound margin is well defined and not attached to the wound base. There is large (67-100%) pink, pale granulation within the wound bed. There is no necrotic tissue within the wound bed. 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 Other: - Go to emergency room for evaluation of infected left foot diabetic foot ulcer Dressing Change Frequency: Wound #6 Left,Plantar Foot: Change Dressing every other day. Skin Barriers/Peri-Wound Care: Skin Prep Wound Cleansing: May shower and wash wound with soap and water. Primary Wound Dressing: Wound #6 Left,Plantar Foot: Silver Collagen - moisten with saline Secondary Dressing: Wound #6 Left,Plantar Foot: Drawtex - cut to fit inside wound edges Foam Border Other: - felt callous pad Off-Loading: Removable cast walker boot to: - left  foot Other: - knee scooter left leg, minimal weight bearing left foot Laboratory ordered were: Anerobic culture - left foot The following medication(s) was prescribed: Augmentin oral 875 mg-125 mg tablet 1 1 tablet oral taken 2 times a day for 15 days or until follow-up with infectious disease starting 12/15/2018 1. At this time I am concerned about osteomyelitis of the patient's left foot at the stump location on the plantar aspect. This does seem to probe down to just above bone and I am concerned again about osteomyelitis at this point. 2. I am also concerned about the fact that we really need to likely get an MRI at this point ASAP in order to evaluate for a bone infection with the cellulitis noted of her foot I am concerned that if we wait to do this as an outpatient coupled with just oral antibiotics while we get her an appointment with infectious disease that this could take too long and could lead to loss of limb. Subsequently I recommended that the patient go to the ER ASAP today for further evaluation and treatment as I feel like this will be her best bet at trying to get more aggressive evaluation including what I hope they would do as an MRI, blood work, and both vascular as well as infectious disease consult. I think she needs some IV antibiotics as soon as possible and really with regard to the vascular compromise that she has based on her previous arterial study she needs to have revascularization as soon as possible to give her the best chance of healing this wound. I am recommending the patient leave here and go straight to the ER I did obtain a wound culture today that does not need to be repeated that was from the deep wound area right where the probe comes in contact with the tissue just overlying the bone. So this was from the deep wound area not just superficial. We will see what that shows although that would take a few days to come back. Electronic Signature(s) Signed:  12/21/2018 8:26:49 PM By: Worthy Keeler PA-C Previous Signature: 12/08/2018 3:36:15 PM Version By: Worthy Keeler PA-C Entered By: Worthy Keeler on 12/15/2018 11:15:44 -------------------------------------------------------------------------------- HxROS Details Patient Name: Date of Service: [name], [name] 12/08/2018 1:45 PM Medical Record [id number] Patient Account Number: [id number] Date of Birth/Sex: Treating RN: [birth year] (53 y.o. Elam Dutch Primary Care Provider: Antony Blackbird Other Clinician: Referring Provider: Treating Provider/Extender:Stone III, Jillyn Hidden, Cammie Weeks in Treatment: 8 Information Obtained From Patient Constitutional Symptoms (General Health) Complaints and Symptoms: Negative for: Fatigue; Fever; Chills; Marked Weight Change Respiratory Complaints and Symptoms: Negative for: Chronic or frequent coughs; Shortness of Breath Cardiovascular Complaints and Symptoms: Negative for: Chest pain Medical History: Positive for: Hypertension Negative for: Hypotension Psychiatric Complaints and Symptoms: Negative for: Claustrophobia; Suicidal Medical History: Positive for: Confinement Anxiety Negative  for: Anorexia/bulimia Past Medical History Notes: anxiety, depression Ear/Nose/Mouth/Throat Medical History: Negative for: Chronic sinus problems/congestion; Middle ear problems Gastrointestinal Medical History: Past Medical History Notes: GERD, IBSD Endocrine Medical History: Positive for: Type II Diabetes Negative for: Type I Diabetes Time with diabetes: age 36 Treated with: Insulin Blood sugar tested every day: Yes Tested : 3 times per day Blood sugar testing results: Breakfast: 90-118; Bedtime: 90-100 Genitourinary Medical History: Negative for: End Stage Renal Disease Integumentary (Skin) Medical History: Negative for: History of Burn Musculoskeletal Medical History: Positive for: Osteomyelitis - hx left foot Negative  for: Gout; Rheumatoid Arthritis; Osteoarthritis Neurologic Medical History: Positive for: Neuropathy Negative for: Dementia; Quadriplegia; Paraplegia; Seizure Disorder Past Medical History Notes: fibromyalgia, CVA Oncologic Medical History: Negative for: Received Chemotherapy; Received Radiation Immunizations Pneumococcal Vaccine: Received Pneumococcal Vaccination: No Implantable Devices None Hospitalization / Surgery History Type of Hospitalization/Surgery achilles tendon lengthening left foot left foot amputation cholecystectomy endometrial ablation Family and Social History Cancer: No; Diabetes: Yes - Mother,Father; Heart Disease: Yes - Mother,Father; Hereditary Spherocytosis: No; Hypertension: Yes - Mother,Father; Kidney Disease: Yes - Mother,Father; Lung Disease: No; Seizures: No; Stroke: No; Thyroid Problems: Yes - Mother; Tuberculosis: No; Current every day smoker - 1/2 ppd; Marital Status - Divorced; Alcohol Use: Never; Drug Use: No History; Caffeine Use: Daily - tea; Financial Concerns: No; Food, Clothing or Shelter Needs: No; Support System Lacking: No; Transportation Concerns: No Physician Affirmation I have reviewed and agree with the above information. Electronic Signature(s) Signed: 12/09/2018 12:09:12 PM By: Baruch Gouty RN, BSN Signed: 12/12/2018 10:37:03 PM By: Worthy Keeler PA-C Entered By: Worthy Keeler on 12/08/2018 15:33:40 -------------------------------------------------------------------------------- SuperBill Details Patient Name: Date of Service: Donna, Martinez 12/08/2018 Medical Record [id number] Patient Account Number: [id number] Date of Birth/Sex: Treating RN: 1965/04/11 (53 y.o. 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

## 2018-12-09 ENCOUNTER — Emergency Department (HOSPITAL_COMMUNITY)
Admission: EM | Admit: 2018-12-09 | Discharge: 2018-12-09 | Disposition: A | Discharge status: Home / Self Care | Payer: Medicaid Other

## 2018-12-09 ENCOUNTER — Other Ambulatory Visit: Payer: Self-pay | Admitting: Interventional Radiology

## 2018-12-09 ENCOUNTER — Ambulatory Visit
Admission: RE | Admit: 2018-12-09 | Discharge: 2018-12-09 | Disposition: A | Discharge status: Home / Self Care | Payer: Medicaid Other | Source: Ambulatory Visit | Attending: Interventional Radiology | Admitting: Interventional Radiology

## 2018-12-09 ENCOUNTER — Encounter: Payer: Self-pay | Admitting: *Deleted

## 2018-12-09 ENCOUNTER — Other Ambulatory Visit: Payer: Self-pay

## 2018-12-09 DIAGNOSIS — I70229 Atherosclerosis of native arteries of extremities with rest pain, unspecified extremity: Secondary | ICD-10-CM

## 2018-12-09 DIAGNOSIS — I998 Other disorder of circulatory system: Secondary | ICD-10-CM

## 2018-12-09 DIAGNOSIS — I739 Peripheral vascular disease, unspecified: Secondary | ICD-10-CM | POA: Diagnosis not present

## 2018-12-09 HISTORY — PX: IR RADIOLOGIST EVAL & MGMT: IMG5224

## 2018-12-09 NOTE — Progress Notes (Signed)
Interventional Radiology Progress Note   [name] is 53 yo female with multiple CV risk factors and history of left lower extremity wound at prior amputation site.   We saw her 9/29 to discuss her symptoms and possible angiogram/intervention, as her non-invasive exam shows that arterial insufficiency is contributing to her wound.   She was not ready to commit to therapy at the time, so we set her up for 3 month follow up.    She has reached out to [place] today to let us know that at her most recent appt at the wound care center there is concern for worsening of the wound.    She would like to proceed now with attempt for treatment with angiogram/intevention.   Her non-invasive shows evidence of fem-pop disease and tibial disease. Right ABI: 1.1 Left ABI: 0.48   We again discussed our plan for intervention, with specific risks for endovascular approach discussed including: bleeding, infection, contrast reaction, renal injury/nephropathy, arterial injury/dissection, need for additional procedure/surgery, worsening symptoms/tissue including limb loss, cardiopulmonary collapse, death.    Plan: -Plan is to proceed with aorto-peripheral angiogram and possible intervention, likely right to left approach. -Recommend maximal medical therapy for cardiovascular risk reduction, including anti-platelet therapy. -Recommend initiating smoking cessation measures, with options discussed.   ___________________________________________________________________   Donna [place] Earleen Newport, DO

## 2018-12-11 LAB — AEROBIC CULTURE W GRAM STAIN (SUPERFICIAL SPECIMEN): Gram Stain: NONE SEEN

## 2018-12-13 ENCOUNTER — Other Ambulatory Visit (HOSPITAL_COMMUNITY): Payer: Self-pay | Admitting: Interventional Radiology

## 2018-12-13 DIAGNOSIS — I70229 Atherosclerosis of native arteries of extremities with rest pain, unspecified extremity: Secondary | ICD-10-CM

## 2018-12-13 DIAGNOSIS — I998 Other disorder of circulatory system: Secondary | ICD-10-CM

## 2018-12-13 NOTE — Telephone Encounter (Signed)
New RX request. Per RX request this was filled for patient 7 months ago by Dr. Margarita Rana

## 2018-12-15 ENCOUNTER — Encounter (HOSPITAL_BASED_OUTPATIENT_CLINIC_OR_DEPARTMENT_OTHER): Payer: Medicaid Other | Admitting: Physician Assistant

## 2018-12-16 ENCOUNTER — Other Ambulatory Visit: Payer: Self-pay | Admitting: Physician Assistant

## 2018-12-16 ENCOUNTER — Other Ambulatory Visit (HOSPITAL_COMMUNITY): Payer: Self-pay | Admitting: Physician Assistant

## 2018-12-16 DIAGNOSIS — E08621 Diabetes mellitus due to underlying condition with foot ulcer: Secondary | ICD-10-CM

## 2018-12-16 DIAGNOSIS — L97525 Non-pressure chronic ulcer of other part of left foot with muscle involvement without evidence of necrosis: Secondary | ICD-10-CM

## 2018-12-17 ENCOUNTER — Ambulatory Visit (HOSPITAL_COMMUNITY)
Admission: RE | Admit: 2018-12-17 | Discharge: 2018-12-17 | Disposition: A | Discharge status: Home / Self Care | Payer: Medicaid Other | Source: Ambulatory Visit | Attending: Interventional Radiology | Admitting: Interventional Radiology

## 2018-12-17 ENCOUNTER — Encounter (HOSPITAL_COMMUNITY): Payer: Self-pay | Admitting: Interventional Radiology

## 2018-12-17 ENCOUNTER — Other Ambulatory Visit: Payer: Self-pay

## 2018-12-17 ENCOUNTER — Other Ambulatory Visit (HOSPITAL_COMMUNITY): Payer: Self-pay | Admitting: Interventional Radiology

## 2018-12-17 DIAGNOSIS — E1040 Type 1 diabetes mellitus with diabetic neuropathy, unspecified: Secondary | ICD-10-CM | POA: Insufficient documentation

## 2018-12-17 DIAGNOSIS — Z794 Long term (current) use of insulin: Secondary | ICD-10-CM | POA: Insufficient documentation

## 2018-12-17 DIAGNOSIS — I70229 Atherosclerosis of native arteries of extremities with rest pain, unspecified extremity: Secondary | ICD-10-CM

## 2018-12-17 DIAGNOSIS — L97525 Non-pressure chronic ulcer of other part of left foot with muscle involvement without evidence of necrosis: Secondary | ICD-10-CM | POA: Insufficient documentation

## 2018-12-17 DIAGNOSIS — E1051 Type 1 diabetes mellitus with diabetic peripheral angiopathy without gangrene: Secondary | ICD-10-CM | POA: Insufficient documentation

## 2018-12-17 DIAGNOSIS — F419 Anxiety disorder, unspecified: Secondary | ICD-10-CM | POA: Insufficient documentation

## 2018-12-17 DIAGNOSIS — I10 Essential (primary) hypertension: Secondary | ICD-10-CM | POA: Diagnosis not present

## 2018-12-17 DIAGNOSIS — K219 Gastro-esophageal reflux disease without esophagitis: Secondary | ICD-10-CM | POA: Diagnosis not present

## 2018-12-17 DIAGNOSIS — Z8673 Personal history of transient ischemic attack (TIA), and cerebral infarction without residual deficits: Secondary | ICD-10-CM | POA: Diagnosis not present

## 2018-12-17 DIAGNOSIS — Z7982 Long term (current) use of aspirin: Secondary | ICD-10-CM | POA: Insufficient documentation

## 2018-12-17 DIAGNOSIS — F329 Major depressive disorder, single episode, unspecified: Secondary | ICD-10-CM | POA: Diagnosis not present

## 2018-12-17 DIAGNOSIS — Z79899 Other long term (current) drug therapy: Secondary | ICD-10-CM | POA: Diagnosis not present

## 2018-12-17 DIAGNOSIS — I998 Other disorder of circulatory system: Secondary | ICD-10-CM

## 2018-12-17 DIAGNOSIS — E10621 Type 1 diabetes mellitus with foot ulcer: Secondary | ICD-10-CM | POA: Insufficient documentation

## 2018-12-17 DIAGNOSIS — F17210 Nicotine dependence, cigarettes, uncomplicated: Secondary | ICD-10-CM | POA: Diagnosis not present

## 2018-12-17 DIAGNOSIS — I70212 Atherosclerosis of native arteries of extremities with intermittent claudication, left leg: Secondary | ICD-10-CM | POA: Diagnosis not present

## 2018-12-17 HISTORY — PX: IR FEM POP ART ATHERECT INC PTA MOD SED: IMG2310

## 2018-12-17 HISTORY — PX: IR ANGIOGRAM EXTREMITY LEFT: IMG651

## 2018-12-17 HISTORY — PX: IR US GUIDE VASC ACCESS RIGHT: IMG2390

## 2018-12-17 LAB — BASIC METABOLIC PANEL
Anion gap: 10 (ref 5–15)
BUN: 44 mg/dL — ABNORMAL HIGH (ref 6–20)
CO2: 20 mmol/L — ABNORMAL LOW (ref 22–32)
Calcium: 8.6 mg/dL — ABNORMAL LOW (ref 8.9–10.3)
Chloride: 106 mmol/L (ref 98–111)
Creatinine, Ser: 4.18 mg/dL — ABNORMAL HIGH (ref 0.44–1.00)
GFR calc Af Amer: 13 mL/min — ABNORMAL LOW (ref >60)
GFR calc non Af Amer: 12 mL/min — ABNORMAL LOW (ref >60)
Glucose, Bld: 190 mg/dL — ABNORMAL HIGH (ref 70–99)
Potassium: 4.4 mmol/L (ref 3.5–5.1)
Sodium: 136 mmol/L (ref 135–145)

## 2018-12-17 LAB — GLUCOSE, CAPILLARY
Glucose-Capillary: 188 mg/dL — ABNORMAL HIGH (ref 70–99)
Glucose-Capillary: 137 mg/dL — ABNORMAL HIGH (ref 70–99)

## 2018-12-17 LAB — PROTIME-INR
INR: 1.2 (ref 0.8–1.2)
Prothrombin Time: 14.9 seconds (ref 11.4–15.2)

## 2018-12-17 LAB — CBC
HCT: 29.7 % — ABNORMAL LOW (ref 36.0–46.0)
Hemoglobin: 9.2 g/dL — ABNORMAL LOW (ref 12.0–15.0)
MCH: 27.5 pg (ref 26.0–34.0)
MCHC: 31 g/dL (ref 30.0–36.0)
MCV: 88.9 fL (ref 80.0–100.0)
Platelets: 404 10*3/uL — ABNORMAL HIGH (ref 150–400)
RBC: 3.34 MIL/uL — ABNORMAL LOW (ref 3.87–5.11)
RDW: 13.1 % (ref 11.5–15.5)
WBC: 21 10*3/uL — ABNORMAL HIGH (ref 4.0–10.5)
nRBC: 0 % (ref 0.0–0.2)

## 2018-12-17 LAB — POCT ACTIVATED CLOTTING TIME
Activated Clotting Time: 202 seconds
Activated Clotting Time: 197 seconds

## 2018-12-17 MED ORDER — TETRACAINE HCL 1 % IJ SOLN
100.0000 mg | Freq: Once | INTRAMUSCULAR | Status: DC
  Filled 2018-12-17: qty 10

## 2018-12-17 MED ORDER — ASPIRIN 325 MG PO TABS
ORAL_TABLET | ORAL | Status: AC
  Filled 2018-12-17: qty 2

## 2018-12-17 MED ORDER — SODIUM CHLORIDE 0.9 % IV SOLN
INTRAVENOUS | Status: AC
  Administered 2018-12-17: 14:00:00 via INTRAVENOUS

## 2018-12-17 MED ORDER — HYDROMORPHONE HCL 1 MG/ML IJ SOLN
INTRAMUSCULAR | Status: AC | PRN
  Administered 2018-12-17 (×2): 0.5 mg via INTRAVENOUS

## 2018-12-17 MED ORDER — HYDRALAZINE HCL 20 MG/ML IJ SOLN
INTRAMUSCULAR | Status: AC
  Filled 2018-12-17: qty 1

## 2018-12-17 MED ORDER — MIDAZOLAM HCL 2 MG/2ML IJ SOLN
INTRAMUSCULAR | Status: AC
  Filled 2018-12-17: qty 2

## 2018-12-17 MED ORDER — NITROGLYCERIN 1 MG/10 ML FOR IR/CATH LAB
INTRA_ARTERIAL | Status: AC
  Filled 2018-12-17: qty 10

## 2018-12-17 MED ORDER — FENTANYL CITRATE (PF) 100 MCG/2ML IJ SOLN
INTRAMUSCULAR | Status: AC
  Filled 2018-12-17: qty 2

## 2018-12-17 MED ORDER — CLOPIDOGREL BISULFATE 300 MG PO TABS
ORAL_TABLET | ORAL | Status: AC
  Administered 2018-12-17: 11:00:00
  Filled 2018-12-17: qty 1

## 2018-12-17 MED ORDER — HYDRALAZINE HCL 20 MG/ML IJ SOLN: 10.0000 mg | INTRAMUSCULAR | Status: DC | PRN

## 2018-12-17 MED ORDER — HYDROMORPHONE HCL 1 MG/ML IJ SOLN
INTRAMUSCULAR | Status: AC
  Filled 2018-12-17: qty 1

## 2018-12-17 MED ORDER — HYDRALAZINE HCL 20 MG/ML IJ SOLN: 5.0000 mg | Freq: Once | INTRAMUSCULAR | Status: DC

## 2018-12-17 MED ORDER — VITAMIN C 500 MG PO TABS: 500.0000 mg | ORAL_TABLET | Freq: Once | ORAL | Status: DC

## 2018-12-17 MED ORDER — TETRACAINE HCL 1 % IJ SOLN
INTRAMUSCULAR | Status: AC | PRN
  Administered 2018-12-17 (×2): 10 mg

## 2018-12-17 MED ORDER — IODIXANOL 320 MG/ML IV SOLN
100.0000 mL | Freq: Once | INTRAVENOUS | Status: AC | PRN
  Administered 2018-12-17: 6 mL via INTRA_ARTERIAL

## 2018-12-17 MED ORDER — SODIUM CHLORIDE 0.9 % IV SOLN
INTRAVENOUS | Status: DC
  Administered 2018-12-17: 10:00:00 via INTRAVENOUS

## 2018-12-17 MED ORDER — HEPARIN SODIUM (PORCINE) 1000 UNIT/ML IJ SOLN
INTRAMUSCULAR | Status: AC | PRN
  Administered 2018-12-17: 8000 [IU] via INTRAVENOUS
  Administered 2018-12-17: 1000 [IU] via INTRAVENOUS

## 2018-12-17 MED ORDER — ASPIRIN 325 MG PO TABS
650.0000 mg | ORAL_TABLET | Freq: Once | ORAL | Status: AC
  Administered 2018-12-17: 650 mg via ORAL

## 2018-12-17 MED ORDER — CLOPIDOGREL BISULFATE 75 MG PO TABS: 300.0000 mg | ORAL_TABLET | Freq: Once | ORAL | Status: DC

## 2018-12-17 MED ORDER — SODIUM CHLORIDE 0.9 % IV SOLN
INTRAVENOUS | Status: AC | PRN
  Administered 2018-12-17: 75 mL/h via INTRAVENOUS

## 2018-12-17 MED ORDER — FENTANYL CITRATE (PF) 100 MCG/2ML IJ SOLN
INTRAMUSCULAR | Status: AC | PRN
  Administered 2018-12-17: 50 ug via INTRAVENOUS
  Administered 2018-12-17: 25 ug via INTRAVENOUS

## 2018-12-17 MED ORDER — MIDAZOLAM HCL 2 MG/2ML IJ SOLN
INTRAMUSCULAR | Status: AC | PRN
  Administered 2018-12-17 (×2): 0.5 mg via INTRAVENOUS
  Administered 2018-12-17: 1 mg via INTRAVENOUS

## 2018-12-17 MED ORDER — ALTEPLASE 2 MG IJ SOLR
INTRAMUSCULAR | Status: AC
  Filled 2018-12-17: qty 4

## 2018-12-17 MED ORDER — PROTAMINE SULFATE 10 MG/ML IV SOLN
50.0000 mg | Freq: Once | INTRAVENOUS | Status: DC
  Filled 2018-12-17: qty 5

## 2018-12-17 MED ORDER — HYDRALAZINE HCL 20 MG/ML IJ SOLN
INTRAMUSCULAR | Status: AC | PRN
  Administered 2018-12-17 (×2): 10 mg via INTRAVENOUS

## 2018-12-17 MED ORDER — ALTEPLASE 2 MG IJ SOLR
INTRAMUSCULAR | Status: AC | PRN
  Administered 2018-12-17: 4 mg

## 2018-12-17 NOTE — H&P (Signed)
Chief Complaint: Patient was seen in consultation today for aorto-peripheral arteriogram with possible intervention  Referring Physician(s): Dr. Tobi Bastos  Supervising Physician: Corrie Mckusick  Patient Status: [name] - Out-pt  History of Present Illness: JAIMEE Martinez is a 53 y.o. 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 [name] was previously seen in consultation with Dr. Earleen Newport on 11/30/18 - please see this consult note for full details. Briefly, she has been seeing wound care for approximately 3 months due a chronic, poorly healing left plantar wound. She was referred to IR for to discuss possible endovascular interventions for PAD/CLI however she did not wish to pursue intervention at that time. Her wound had initially improved however she was seen by wound care again on 10/7 and it was noted that she been experiencing increased pain and drainage from this wound. It was noted that she had a small opening at the roughly 3 o'clock position which probed down to just above the bone, which was new and had not been seen on previous exams. The wound was cultured and she was directed to go to the ED for further imaging and likely IV antibiotics. [name] tells me today that she did go to the ED however she states that "no one knew what was going on and they said they didn't have an order to do anything for me so I left and called my doctor's office 3 times but no one answered." She states that she eventually got in contact with her doctor and was started on Amoxicillin which she took the first dose of on Tuesday of this week. She contacted our service on 10/8 and wishes to proceed with endovascular intervention.   [name] reports that she has been feeling ok, she has not had any  fevers or chills. She does report some worsening soreness to the outer portion of the left foot which she believes is due to her prosthesis rubbing on it. She denies any bleeding from the wound, rectal bleeding, blood in her stool or hematuria. She states understanding of the requested procedure and wishes to proceed.   Past Medical History:  Diagnosis Date  . Anxiety 2002  . Chest tightness   . Depression 2001  . Diabetes type 1, uncontrolled ([place])    at age 53  . Diabetic neuropathy ([place])   . Essential hypertension 2015  . GERD (gastroesophageal reflux disease)    about age of 53  . Nausea and vomiting in adult   . Stroke ([place])   . Urinary frequency   . Yeast vaginitis     Past Surgical History:  Procedure Laterality Date  . ACHILLES TENDON SURGERY Left 03/10/2013   Procedure: LEFT CHOPART AMPUTATION/ LEFT TENDON ACHILLES [place];  Surgeon: Wylene Simmer, MD;  Location: [place];  Service: Orthopedics;  Laterality: Left;  . AMPUTATION Left 06/15/2012   Procedure: AMPUTATION DIGIT;  Surgeon: Meredith Pel, MD;  Location: [place];  Service: Orthopedics;  Laterality: Left;  Left great toe revision amputation  . ENDOMETRIAL ABLATION    . IR RADIOLOGIST EVAL & MGMT  11/30/2018  . IR RADIOLOGIST EVAL & MGMT  12/09/2018  . LAPAROSCOPIC CHOLECYSTECTOMY  01/30/2015   [facility]     Allergies: Lidocaine and Latex  Medications: Prior to Admission medications   Medication Sig Start Date  End Date Taking? Authorizing Provider  alosetron (LOTRONEX) 0.5 MG tablet Take 0.5 mg by mouth 2 (two) times daily. 06/02/17  Yes [provider]  amLODipine (NORVASC) 5 MG tablet Take 1 tablet (5 mg total) by mouth daily. 10/31/18  Yes Fulp, Cammie, MD  aspirin EC 81 MG EC tablet Take 1 tablet (81 mg total) by mouth daily. 04/11/17  Yes Domenic Polite, MD  atorvastatin (LIPITOR) 40 MG tablet TAKE 1 TABLET (40 MG TOTAL) BY MOUTH DAILY AT 6 PM. 12/02/18  Yes Fulp, Cammie, MD  calcium  citrate-vitamin D 500-400 MG-UNIT chewable tablet Chew 1 tablet by mouth 2 (two) times daily. 05/09/14  Yes Funches, Josalyn, MD  Cholecalciferol ([employer] VIT D 5000 HIGH-POTENCY) 5000 units capsule Take 5,000 Units by mouth daily.   Yes [provider]  cyclobenzaprine (FLEXERIL) 10 MG tablet TAKE 1 TABLET AT BEDTIME AS NEEDED FOR MUSCLE SPASM Patient taking differently: Take 10 mg by mouth at bedtime as needed for muscle spasms.  11/16/18  Yes Fulp, Cammie, MD  DEXILANT 60 MG capsule Take 60 mg by mouth daily. 06/09/17  Yes [provider]  diphenoxylate-atropine (LOMOTIL) 2.5-0.025 MG tablet Take 2 tablets by mouth 2 (two) times daily as needed for diarrhea or loose stools. 10/31/18  Yes Fulp, Cammie, MD  DULoxetine (CYMBALTA) 60 MG capsule Take 1 cap by mouth per day. Must keep appt next week for more refills. 10/18/18  Yes Fulp, Cammie, MD  fluticasone (FLONASE) 50 MCG/ACT nasal spray Place 2 sprays into both nostrils daily. 10/18/18  Yes Fulp, Cammie, MD  hydrALAZINE (APRESOLINE) 50 MG tablet Take 1 tablet (50 mg total) by mouth 3 (three) times daily. 10/06/17 12/17/18 Yes Fulp, Cammie, MD  insulin aspart (NOVOLOG FLEXPEN) 100 UNIT/ML FlexPen Inject 10 Units into the skin 3 (three) times daily with meals. 12/01/18  Yes Fulp, Cammie, MD  Insulin Glargine (LANTUS SOLOSTAR) 100 UNIT/ML Solostar Pen INJECT 50 UNITS SUBCUTANEOUSLY EVERY DAY Patient taking differently: Inject 50 Units into the skin daily.  08/26/18  Yes Fulp, Cammie, MD  oxyCODONE (OXY IR/ROXICODONE) 5 MG immediate release tablet Take 5 mg by mouth every 6 (six) hours as needed (pain).   Yes [provider]  promethazine (PHENERGAN) 12.5 MG tablet Take 1 tablet (12.5 mg total) by mouth every 8 (eight) hours as needed for nausea or vomiting. 11/24/18  Yes Fulp, Cammie, MD  zolpidem (AMBIEN) 5 MG tablet TAKE ONE TABLET BY MOUTH ONCE DAILY AT BEDTIME AS NEEDED FOR SLEEP Patient taking differently: Take 5 mg by mouth at bedtime  as needed for sleep.  12/13/18  Yes Fulp, Cammie, MD  acetaminophen-codeine (TYLENOL #3) 300-30 MG tablet TAKE 1 TABLET EVERY 8 HOURS AS NEEDED FOR MODERATE PAIN (INS WILL ONLY PAY FOR 21) Patient not taking: Reported on 12/17/2018 10/31/18   Fulp, Cammie, MD  aluminum chloride (DRYSOL) 20 % external solution Apply topically at bedtime. Patient not taking: Reported on 12/17/2017 09/01/16   Boykin Nearing, MD  Blood Glucose Monitoring Suppl (ACCU-CHEK AVIVA PLUS) w/Device KIT CHECK BLOOD SUGARS THREE TIMES A DAY.  DX:250.03 10/06/17   Fulp, Cammie, MD  glucose blood (ACCU-CHEK AVIVA PLUS) test strip USE TO CHECK BLOOD SUGAR BEFORE MEALS AND AT BEDTIME dx code E10.65 10/06/17   Fulp, Cammie, MD  insulin lispro (HUMALOG KWIKPEN) 100 UNIT/ML KiwkPen Inject 0.13 mLs (13 Units total) into the skin 3 (three) times daily. Patient not taking: Reported on 11/24/2018 11/04/17   Charlott Rakes, MD  Insulin Pen Needle (TRUEPLUS PEN  NEEDLES) 32G X 4 MM MISC Use to inject insulin as directed. 12/01/18   Fulp, Cammie, MD  promethazine (PHENERGAN) 25 MG tablet Take 1 tablet (25 mg total) by mouth every 8 (eight) hours as needed for nausea or vomiting. Patient not taking: Reported on 11/24/2018 10/03/16   Boykin Nearing, MD  topiramate (TOPAMAX) 25 MG tablet Take 1 tablet (25 mg total) by mouth 2 (two) times daily. Patient not taking: Reported on 05/10/2018 06/25/17   Garvin Fila, MD     Family History  Problem Relation Age of Onset  . Diabetes Mother   . Heart attack Mother   . Heart disease Mother        before age 29  . Hypertension Mother   . Hyperlipidemia Mother   . Diabetes Father   . Heart attack Father   . Heart disease Father        before age 20  . Diabetes Sister   . Hypertension Sister     Social History   Socioeconomic History  . Marital status: Divorced    Spouse name: Not on file  . Number of children: 1  . Years of education: Not on file  . Highest education level: Not on file   Occupational History    Employer: [employer]  . Financial resource strain: Not on file  . Food insecurity    Worry: Not on file    Inability: Not on file  . Transportation needs    Medical: Not on file    Non-medical: Not on file  Tobacco Use  . Smoking status: Light Tobacco Smoker    Packs/day: 0.25    Years: 22.00    Pack years: 5.50    Types: Cigarettes  . Smokeless tobacco: Never Used  Substance and Sexual Activity  . Alcohol use: Yes    Alcohol/week: 0.0 standard drinks    Comment: sociial  . Drug use: No  . Sexual activity: Yes    Partners: Male  Lifestyle  . Physical activity    Days per week: Not on file    Minutes per session: Not on file  . Stress: Not on file  Relationships  . Social Herbalist on phone: Not on file    Gets together: Not on file    Attends religious service: Not on file    Active member of club or organization: Not on file    Attends meetings of clubs or organizations: Not on file    Relationship status: Not on file  Other Topics Concern  . Not on file  Social History Narrative   Lives at home with her daughter   Right handed   Caffeine: 3 cups daily     Review of Systems: A 12 point ROS discussed and pertinent positives are indicated in the HPI above.  All other systems are negative.  Review of Systems  Constitutional: Negative for chills, fatigue and fever.  HENT: Negative for nosebleeds.   Respiratory: Negative for cough and shortness of breath.   Cardiovascular: Negative for chest pain.  Gastrointestinal: Negative for abdominal pain, anal bleeding, blood in stool, diarrhea, nausea and vomiting.  Genitourinary: Negative for dysuria, hematuria and vaginal bleeding.  Musculoskeletal: Positive for arthralgias. Negative for back pain.  Skin: Positive for wound.  Neurological: Negative for dizziness and headaches.    Vital Signs: BP (!) 181/75   Pulse 74   Temp 98 F (36.7 C) (Skin)   Resp (!) 21  Ht 5'  10" (1.778 m)   Wt 210 lb (95.3 kg)   SpO2 97%   BMI 30.13 kg/m   Physical Exam Vitals signs reviewed.  Constitutional:      General: She is not in acute distress. HENT:     Head: Normocephalic.     Mouth/Throat:     Mouth: Mucous membranes are moist.     Pharynx: Oropharynx is clear. No oropharyngeal exudate or posterior oropharyngeal erythema.  Cardiovascular:     Rate and Rhythm: Normal rate and regular rhythm.     Comments: (+) palpable femoral pulses bilaterally Pulmonary:     Effort: Pulmonary effort is normal.     Breath sounds: Normal breath sounds.  Abdominal:     General: Bowel sounds are normal. There is no distension.     Palpations: Abdomen is soft.     Tenderness: There is no abdominal tenderness.  Skin:    General: Skin is warm and dry.     Comments: (+) left plantar wound -- see pictured below  Neurological:     Mental Status: She is alert and oriented to person, place, and time.  Psychiatric:        Mood and Affect: Mood normal.        Behavior: Behavior normal.        Thought Content: Thought content normal.        Judgment: Judgment normal.         MD Evaluation Airway: WNL Heart: WNL Abdomen: WNL Chest/ Lungs: WNL ASA  Classification: 3 Mallampati/Airway Score: Two   Imaging: US Arterial Seg Multiple Le (abi, Segmental Pressures, Pvr's)  Result Date: 11/17/2018 CLINICAL DATA:  53 year old female with a history of diabetic foot ulcer EXAM: NONINVASIVE PHYSIOLOGIC VASCULAR STUDY OF BILATERAL LOWER EXTREMITIES TECHNIQUE: Evaluation of both lower extremities was performed at rest, including calculation of ankle-brachial indices, multiple segmental pressure evaluation, segmental Doppler and segmental pulse volume recording. COMPARISON:  None. FINDINGS: Right: Resting ankle brachial index:  1.06 Segmental blood pressure: Symmetric upper extremity pressures. Appropriate increased to the thigh with noncompressible high-thigh segment. Doppler:  Segmental Doppler demonstrates triphasic waveforms throughout Pulse volume recording: Segmental PVR maintained on the left Left: Resting ankle brachial index: 0.48 Segmental blood pressure: Symmetric upper extremity pressures. Segmental drop from the thigh to the below knee segment. Doppler: Segmental Doppler demonstrates triphasic femoral artery waveform with monophasic popliteal artery, posterior tibial artery, and dorsalis pedis waveform. Pulse volume recording: Segmental PVR demonstrates amplitude maintained in the thigh with loss of amplitude and quality below knee and at the ankle segment. Flat line at the left great toe. Additional: IMPRESSION: Right: Resting ABI within normal limits with the segmental exam demonstrating no significant arterial occlusive disease. Left: Resting ABI in the moderate range arterial occlusive disease, with segmental exam demonstrating SFA occlusion. Signed, Dulcy Fanny. Dellia Nims, RPVI [facility] Radiology Electronically Signed   By: Corrie Mckusick D.O.   On: 11/17/2018 15:54   Ir Radiologist Eval & Mgmt  Result Date: 12/09/2018 Please refer to notes tab for details about interventional procedure. (Op Note)  Ir Radiologist Eval & Mgmt  Result Date: 11/30/2018 Please refer to notes tab for details about interventional procedure. (Op Note)   Labs:  CBC: Recent Labs    10/29/18 1439 12/17/18 0907  WBC 13.4* 21.0*  HGB 12.0 9.2*  HCT 36.6 29.7*  PLT 345 404*    COAGS: Recent Labs    12/17/18 0907  INR 1.2  BMP: Recent Labs    12/17/17 1642 10/29/18 1439 12/17/18 0907  NA 143 140 136  K 4.9 4.7 4.4  CL 105 104 106  CO2 21 21 20*  GLUCOSE 81 121* 190*  BUN 35* 43* 44*  CALCIUM 8.9 9.3 8.6*  CREATININE 2.67* 3.07* 4.18*  GFRNONAA 20* 17* 12*  GFRAA 23* 19* 13*    LIVER FUNCTION TESTS: Recent Labs    10/29/18 1439  BILITOT 0.3  AST 11  ALT 9  ALKPHOS 142*  PROT 7.3  ALBUMIN 4.2     TUMOR MARKERS: No results for input(s): AFPTM, CEA, CA199, CHROMGRNA in the last 8760 hours.  Assessment and Plan:  53 y/o F with history of DM, tobacco use, HTN, PAD and chronic left plantar wound followed by wound care who presents today for an aorto-peripheral arteriogram with possible intervention as previously discussed in consultation with Dr. Earleen Newport on 11/26/18.  Patient has been NPO since 9 pm yesterday, she does not take blood thinning medications, she is currently taking amoxicillin for her chronic left plantar wound. Afebrile, WBC 21.0, hgb 9.2, plt 404, creatinine 4.18, INR 1.2. Discussed with patient her elevated WBC, worsening creatinine and 3 point drop in hgb since last month's labs - she denies any bleeding from anywhere, she states she has been drinking less fluids than normal. She was encouraged several times to follow up with her PCP soon regarding these lab values to which she stated understanding.  Risks and benefits of aorto-peripheral angiogram with possible intervention were discussed with the patient including, but not limited to bleeding, infection, vascular injury, stroke, or contrast induced renal failure. This interventional procedure involves the use of X-rays and because of the nature of the planned procedure, it is possible that we will have prolonged use of X-ray fluoroscopy. Potential radiation risks to you include (but are not limited to) the following: - A slightly elevated risk for cancer  several years later in life. This risk is typically less than 0.5% percent. This risk is low in comparison to the normal incidence of human cancer, which is 33% for women and 50% for men according to the [place]. - Radiation induced injury can include skin redness, resembling a rash, tissue breakdown / ulcers and hair loss (which can be temporary or permanent).  The likelihood of either of these occurring depends on the difficulty of the procedure and whether  you are sensitive to radiation due to previous procedures, disease, or genetic conditions.  IF your procedure requires a prolonged use of radiation, you will be notified and given written instructions for further action.  It is your responsibility to monitor the irradiated area for the 2 weeks following the procedure and to notify your physician if you are concerned that you have suffered a radiation induced injury.    All of the patient's questions were answered, patient is agreeable to proceed.  Consent signed and in chart.  Thank you for this interesting consult.  I greatly enjoyed meeting LAURRIE TOPPIN and look forward to participating in their care.  A copy of this report was sent to the requesting provider on this date.  Electronically Signed: Joaquim Nam, PA-C 12/17/2018, 10:40 AM   I spent a total of  25 Minutes in face to face in clinical consultation, greater than 50% of which was counseling/coordinating care for aorto-peripheral angiogram with possible intervention.

## 2018-12-17 NOTE — Discharge Instructions (Addendum)
Angiogram, Care After This sheet gives you information about how to care for yourself after your procedure. Your health care provider may also give you more specific instructions. If you have problems or questions, contact your health care provider. What can I expect after the procedure? After the procedure, it is common to have bruising and tenderness at the catheter insertion area. Follow these instructions at home: Insertion site care  Follow instructions from your health care provider about how to take care of your insertion site. Make sure you: ? Wash your hands with soap and water before you change your bandage (dressing). If soap and water are not available, use hand sanitizer. ? Change your dressing as told by your health care provider. ? Leave stitches (sutures), skin glue, or adhesive strips in place. These skin closures may need to stay in place for 2 weeks or longer. If adhesive strip edges start to loosen and curl up, you may trim the loose edges. Do not remove adhesive strips completely unless your health care provider tells you to do that.  Do not take baths, swim, or use a hot tub until your health care provider approves.  You may shower 24-48 hours after the procedure or as told by your health care provider. ? Gently wash the site with plain soap and water. ? Pat the area dry with a clean towel. ? Do not rub the site. This may cause bleeding.  Do not apply powder or lotion to the site. Keep the site clean and dry.  Check your insertion site every day for signs of infection. Check for: ? Redness, swelling, or pain. ? Fluid or blood. ? Warmth. ? Pus or a bad smell. Activity  Rest as told by your health care provider, usually for 1-2 days.  Do not lift anything that is heavier than 10 lbs. (4.5 kg) or as told by your health care provider.  Do not drive for 24 hours if you were given a medicine to help you relax (sedative).  Do not drive or use heavy machinery while  taking prescription pain medicine. General instructions   Return to your normal activities as told by your health care provider, usually in about a week. Ask your health care provider what activities are safe for you.  If the catheter site starts bleeding, lie flat and put pressure on the site. If the bleeding does not stop, get help right away. This is a medical emergency.  Drink enough fluid to keep your urine clear or pale yellow. This helps flush the contrast dye from your body.  Take over-the-counter and prescription medicines only as told by your health care provider.  Keep all follow-up visits as told by your health care provider. This is important. Contact a health care provider if:  You have a fever or chills.  You have redness, swelling, or pain around your insertion site.  You have fluid or blood coming from your insertion site.  The insertion site feels warm to the touch.  You have pus or a bad smell coming from your insertion site.  You have bruising around the insertion site.  You notice blood collecting in the tissue around the catheter site (hematoma). The hematoma may be painful to the touch. Get help right away if:  You have severe pain at the catheter insertion area.  The catheter insertion area swells very fast.  The catheter insertion area is bleeding, and the bleeding does not stop when you hold steady pressure on the area.  The area near or just beyond the catheter insertion site becomes pale, cool, tingly, or numb. °These symptoms may represent a serious problem that is an emergency. Do not wait to see if the symptoms will go away. Get medical help right away. Call your local emergency services (911 in the [place] Do not drive yourself to the hospital. °Summary °· After the procedure, it is common to have bruising and tenderness at the catheter insertion area. °· After the procedure, it is important to rest and drink plenty of fluids. °· Do not take baths,  swim, or use a hot tub until your health care provider says it is okay to do so. You may shower 24-48 hours after the procedure or as told by your health care provider. °· If the catheter site starts bleeding, lie flat and put pressure on the site. If the bleeding does not stop, get help right away. This is a medical emergency. °This information is not intended to replace advice given to you by your health care provider. Make sure you discuss any questions you have with your health care provider. °Document Released: 09/05/2004 Document Revised: 01/30/2017 Document Reviewed: 01/23/2016 °Elsevier Patient Education © 2020 Elsevier Inc. °Moderate Conscious Sedation, Adult, Care After °These instructions provide you with information about caring for yourself after your procedure. Your health care provider may also give you more specific instructions. Your treatment has been planned according to current medical practices, but problems sometimes occur. Call your health care provider if you have any problems or questions after your procedure. °What can I expect after the procedure? °After your procedure, it is common: °· To feel sleepy for several hours. °· To feel clumsy and have poor balance for several hours. °· To have poor judgment for several hours. °· To vomit if you eat too soon. °Follow these instructions at home: °For at least 24 hours after the procedure: ° °· Do not: °? Participate in activities where you could fall or become injured. °? Drive. °? Use heavy machinery. °? Drink alcohol. °? Take sleeping pills or medicines that cause drowsiness. °? Make important decisions or sign legal documents. °? Take care of children on your own. °· Rest. °Eating and drinking °· Follow the diet recommended by your health care provider. °· If you vomit: °? Drink water, juice, or soup when you can drink without vomiting. °? Make sure you have little or no nausea before eating solid foods. °General instructions °· Have a  responsible adult stay with you until you are awake and alert. °· Take over-the-counter and prescription medicines only as told by your health care provider. °· If you smoke, do not smoke without supervision. °· Keep all follow-up visits as told by your health care provider. This is important. °Contact a health care provider if: °· You keep feeling nauseous or you keep vomiting. °· You feel light-headed. °· You develop a rash. °· You have a fever. °Get help right away if: °· You have trouble breathing. °This information is not intended to replace advice given to you by your health care provider. Make sure you discuss any questions you have with your health care provider. °Document Released: 12/08/2012 Document Revised: 01/30/2017 Document Reviewed: 06/09/2015 °Elsevier Patient Education © 2020 Elsevier Inc. ° °

## 2018-12-17 NOTE — Procedures (Signed)
Interventional Radiology Procedure Note  Procedure: US guided right CFA access, with left LE Co2 angio, treatment of SFA occlusion with atherectomy and DEB angioplasty.   Findings:  SFA CTO Restoration of inline flow to the ankle, with no residual stenosis of the SFA.   Complications: None  Recommendations:  - right hip straight x 4 hours - DC home in 4 hours - gentle hydration - DAPT - Hydralazine as needed - advance diet - 4 hours dc home - continue wound care - follow up with Dr. Earleen Newport in 4-6 weeks with new non-invasive  Signed,  Dulcy Fanny. Earleen Newport, DO

## 2018-12-17 NOTE — Sedation Documentation (Signed)
Pt is complaining of foot, leg, and back pain. Writer administered 0.5 mg dilaudid iv. Hypertensive Bps, 10mg  hydralazine administered @1241 

## 2018-12-22 ENCOUNTER — Telehealth: Payer: Self-pay | Admitting: Family Medicine

## 2018-12-22 DIAGNOSIS — M545 Low back pain, unspecified: Secondary | ICD-10-CM

## 2018-12-22 DIAGNOSIS — E1140 Type 2 diabetes mellitus with diabetic neuropathy, unspecified: Secondary | ICD-10-CM

## 2018-12-22 DIAGNOSIS — G8929 Other chronic pain: Secondary | ICD-10-CM

## 2018-12-22 MED ORDER — DULOXETINE HCL 60 MG PO CPEP: ORAL_CAPSULE | ORAL | 0 refills | Status: DC

## 2018-12-22 NOTE — Telephone Encounter (Signed)
1) Medication(s) Requested (by name): -DULoxetine (CYMBALTA) 60 MG capsule   2) [id number] [place], [place]

## 2018-12-27 ENCOUNTER — Other Ambulatory Visit: Payer: Self-pay | Admitting: Interventional Radiology

## 2018-12-27 DIAGNOSIS — M7031 Other bursitis of elbow, right elbow: Secondary | ICD-10-CM | POA: Diagnosis not present

## 2018-12-27 DIAGNOSIS — E09621 Drug or chemical induced diabetes mellitus with foot ulcer: Secondary | ICD-10-CM

## 2018-12-27 DIAGNOSIS — L308 Other specified dermatitis: Secondary | ICD-10-CM | POA: Diagnosis not present

## 2018-12-27 DIAGNOSIS — I998 Other disorder of circulatory system: Secondary | ICD-10-CM

## 2018-12-27 DIAGNOSIS — I70229 Atherosclerosis of native arteries of extremities with rest pain, unspecified extremity: Secondary | ICD-10-CM

## 2018-12-27 LAB — FECAL OCCULT BLOOD, IMMUNOCHEMICAL

## 2018-12-29 DIAGNOSIS — R32 Unspecified urinary incontinence: Secondary | ICD-10-CM | POA: Diagnosis not present

## 2018-12-31 ENCOUNTER — Encounter: Payer: Self-pay | Admitting: *Deleted

## 2019-01-03 ENCOUNTER — Telehealth: Payer: Self-pay | Admitting: Family Medicine

## 2019-01-03 NOTE — Telephone Encounter (Signed)
1) Medication(s) Requested (by name): novolog flexpen med refill  2) [id number] [place], [place].

## 2019-01-04 ENCOUNTER — Telehealth: Payer: Self-pay | Admitting: Family Medicine

## 2019-01-04 ENCOUNTER — Other Ambulatory Visit: Payer: Self-pay | Admitting: Family Medicine

## 2019-01-04 DIAGNOSIS — Z794 Long term (current) use of insulin: Secondary | ICD-10-CM

## 2019-01-04 DIAGNOSIS — E118 Type 2 diabetes mellitus with unspecified complications: Secondary | ICD-10-CM

## 2019-01-04 MED ORDER — NOVOLOG FLEXPEN 100 UNIT/ML ~~LOC~~ SOPN: 10.0000 [IU] | PEN_INJECTOR | Freq: Three times a day (TID) | SUBCUTANEOUS | 4 refills | Status: DC

## 2019-01-04 NOTE — Telephone Encounter (Signed)
Pt called requesting prior auth be completed on -insulin aspart (NOVOLOG FLEXPEN) 100 UNIT/ML FlexPen  States her pharmacy wont fill until she has medicaid approval, please follow up

## 2019-01-04 NOTE — Telephone Encounter (Signed)
Notify patient that requested refill has been sent to her pharmacy

## 2019-01-04 NOTE — Telephone Encounter (Signed)
Informed patient with what provider stated and she verbalize understanding.

## 2019-01-04 NOTE — Progress Notes (Signed)
Patient ID: [name], female   DOB: December 17, 1965, 53 y.o.   MRN: [id number]   Prescription request for refill of NovoLog received and prescription will be sent to patient's pharmacy

## 2019-01-05 ENCOUNTER — Telehealth: Payer: Self-pay

## 2019-01-05 NOTE — Telephone Encounter (Signed)
Pt's Novolog does not require prior authorization thru [place] Medicaid.  This is their preferred drug.  [facility] and they stated that the claim/fill was 7 days early.  Medicaid will pay for med in 7 days.  If patient has further questions [facility] can call [facility].  If any overrides are needed the pharmacy can resolve, but if directions have not changed/increased then no override is needed and pt must wait 7 days.

## 2019-01-05 NOTE — Telephone Encounter (Signed)
Patient called and stated [facility] and was told the only way she could get her insulin without having to wait 7 days is if her PCP re-writes the Rx and states in the Rx that she is on a sliding scale. Please f/u.

## 2019-01-06 ENCOUNTER — Telehealth: Payer: Self-pay

## 2019-01-06 NOTE — Telephone Encounter (Signed)
Left voicemail for pt on 01/06/2019 12pm.  [facility] pens [facility] 12/02/2018 which is a 50 day supply.  Medicaid will pay for rx again in 6 days.  Pt should not be out of med.  Her options are to wait until Medicaid will pay again or pay out-of-pocket(cash).  No overrides are available if directions have not changed.

## 2019-01-06 NOTE — Telephone Encounter (Signed)
lmom 

## 2019-01-12 DIAGNOSIS — Z79899 Other long term (current) drug therapy: Secondary | ICD-10-CM | POA: Diagnosis not present

## 2019-01-12 DIAGNOSIS — G546 Phantom limb syndrome with pain: Secondary | ICD-10-CM | POA: Diagnosis not present

## 2019-01-12 DIAGNOSIS — G894 Chronic pain syndrome: Secondary | ICD-10-CM | POA: Diagnosis not present

## 2019-01-13 ENCOUNTER — Other Ambulatory Visit: Payer: Self-pay | Admitting: Family Medicine

## 2019-01-13 DIAGNOSIS — K580 Irritable bowel syndrome with diarrhea: Secondary | ICD-10-CM

## 2019-01-13 DIAGNOSIS — G4700 Insomnia, unspecified: Secondary | ICD-10-CM

## 2019-01-18 ENCOUNTER — Inpatient Hospital Stay: Admission: RE | Admit: 2019-01-18 | Payer: Medicaid Other | Source: Ambulatory Visit

## 2019-01-31 ENCOUNTER — Other Ambulatory Visit: Payer: Self-pay | Admitting: Family Medicine

## 2019-01-31 ENCOUNTER — Telehealth: Payer: Self-pay | Admitting: Family Medicine

## 2019-01-31 ENCOUNTER — Other Ambulatory Visit (HOSPITAL_BASED_OUTPATIENT_CLINIC_OR_DEPARTMENT_OTHER): Payer: Medicaid Other | Admitting: Family Medicine

## 2019-01-31 DIAGNOSIS — E1165 Type 2 diabetes mellitus with hyperglycemia: Secondary | ICD-10-CM

## 2019-01-31 DIAGNOSIS — IMO0002 Reserved for concepts with insufficient information to code with codable children: Secondary | ICD-10-CM

## 2019-01-31 DIAGNOSIS — I150 Renovascular hypertension: Secondary | ICD-10-CM

## 2019-01-31 MED ORDER — HYDRALAZINE HCL 50 MG PO TABS: 50.0000 mg | ORAL_TABLET | Freq: Three times a day (TID) | ORAL | 5 refills | Status: DC

## 2019-01-31 NOTE — Telephone Encounter (Signed)
1) Medication(s) Requested (by name): hydrALAZINE (APRESOLINE) 50 MG tablet [id number]   2) [facility]  [id number] [place], [place].   Patient has been waiting on authorization for a week. Please refill or contact patient.   Approved medications will be sent to pharmacy, we will reach out to you if there is an issue.  Requests made after 3pm may not be addressed until following business day!

## 2019-01-31 NOTE — Telephone Encounter (Signed)
Please fill lantus at the current signature. MA can see the hydralazine reponse.

## 2019-01-31 NOTE — Telephone Encounter (Signed)
Notify patient that 30 day supply with 5 refills was sent in for the medication she requested

## 2019-01-31 NOTE — Progress Notes (Signed)
Patient ID: [name], female   DOB: 1965-09-29, 53 y.o.   MRN: [id number]   Patient requesting refill of hydralazine- 30 day supply with 5 refills sent to patient's pharmacy

## 2019-02-05 DIAGNOSIS — R32 Unspecified urinary incontinence: Secondary | ICD-10-CM | POA: Diagnosis not present

## 2019-02-08 ENCOUNTER — Ambulatory Visit
Admission: RE | Admit: 2019-02-08 | Discharge: 2019-02-08 | Disposition: A | Discharge status: Home / Self Care | Payer: Medicaid Other | Source: Ambulatory Visit | Attending: Interventional Radiology | Admitting: Interventional Radiology

## 2019-02-08 DIAGNOSIS — I70229 Atherosclerosis of native arteries of extremities with rest pain, unspecified extremity: Secondary | ICD-10-CM

## 2019-02-08 DIAGNOSIS — I998 Other disorder of circulatory system: Secondary | ICD-10-CM

## 2019-02-08 DIAGNOSIS — E09621 Drug or chemical induced diabetes mellitus with foot ulcer: Secondary | ICD-10-CM

## 2019-02-08 DIAGNOSIS — S91302A Unspecified open wound, left foot, initial encounter: Secondary | ICD-10-CM | POA: Diagnosis not present

## 2019-02-08 DIAGNOSIS — L97508 Non-pressure chronic ulcer of other part of unspecified foot with other specified severity: Secondary | ICD-10-CM

## 2019-02-08 HISTORY — PX: IR RADIOLOGIST EVAL & MGMT: IMG5224

## 2019-02-08 NOTE — Progress Notes (Signed)
Chief Complaint: Left foot wound, heel  Referring Physician(s): [facility]  History of Present Illness: [name] is a 53 y.o. female presenting to [facility] today as a scheduled follow up of the left leg, SP left lower limb revascularization.   [name] is here today by herself for our interview.      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  She then most recently had a wound develop on the heel of her left foot.   We treated her 12/17/2018 with revascularization of left SFA occlusion, restoring in-line flow.    She tells me that she no longer sees the wound care center, and has not been for an appointment since before our procedure.  She tells me that the pain in her foot is significantly improved, and that she feels that the wound is healing after our procedure.    She continues to ambulate for whatever needs she has, and she uses a boot.  There are no specific inserts to allow off-loading of the heel wound.  She continues to smoke, endorsing ~6 cigarettes per day.   Repeat ABI and segmental performed today shows: Right ABI of 1.01, with tibial disease.  Improved left ABI of 0.86, (previous 0.48) with improved segmental exam.    Past Medical History:  Diagnosis Date  . Anxiety 2002  . Chest tightness   . Depression 2001  . Diabetes type 1, uncontrolled ([name])    at age 4  . Diabetic neuropathy ([place])   . Essential hypertension 2015  . GERD (gastroesophageal reflux disease)    about age of 26  . Nausea and vomiting in adult   . Stroke ([place])   . Urinary frequency   . Yeast vaginitis     Past Surgical History:  Procedure Laterality Date  . ACHILLES TENDON SURGERY Left 03/10/2013   Procedure: LEFT CHOPART AMPUTATION/ LEFT TENDON ACHILLES [place];  Surgeon: Wylene Simmer, MD;  Location: [facility];  Service: Orthopedics;  Laterality: Left;  . AMPUTATION Left 06/15/2012   Procedure: AMPUTATION DIGIT;  Surgeon: Meredith Pel, MD;  Location: [facility];  Service: Orthopedics;  Laterality: Left;  Left great toe revision amputation  . ENDOMETRIAL ABLATION    . IR ANGIOGRAM EXTREMITY LEFT  12/17/2018  . IR FEM POP ART ATHERECT INC PTA MOD SED  12/17/2018  . IR RADIOLOGIST EVAL & MGMT  11/30/2018  . IR RADIOLOGIST EVAL & MGMT  12/09/2018  . IR US GUIDE VASC ACCESS RIGHT  12/17/2018  . LAPAROSCOPIC CHOLECYSTECTOMY  01/30/2015   [facility]     Allergies: Lidocaine and Latex  Medications: Prior to Admission medications   Medication Sig Start Date End Date Taking? Authorizing Provider  acetaminophen-codeine (TYLENOL #3) 300-30 MG tablet TAKE 1 TABLET EVERY 8 HOURS AS NEEDED FOR MODERATE PAIN (INS WILL ONLY PAY FOR 21) Patient not taking: Reported on 12/17/2018 10/31/18   Fulp, Cammie, MD  alosetron (LOTRONEX) 0.5 MG tablet Take 0.5 mg by mouth 2 (two) times daily. 06/02/17   [provider]  aluminum chloride (DRYSOL) 20 % external solution Apply topically at bedtime. Patient not taking: Reported on 12/17/2017 09/01/16   Boykin Nearing, MD  amLODipine (NORVASC) 5 MG tablet Take 1 tablet (5 mg total) by mouth daily. 10/31/18   Fulp, Cammie, MD  aspirin EC 81 MG EC tablet Take 1 tablet (81 mg total) by mouth daily. 04/11/17   Broadus John,  Jacinta Shoe, MD  atorvastatin (LIPITOR) 40 MG tablet TAKE 1 TABLET (40 MG TOTAL) BY MOUTH DAILY AT 6 PM. 12/02/18   Fulp, Cammie, MD  Blood Glucose Monitoring Suppl (ACCU-CHEK AVIVA PLUS) w/Device KIT CHECK BLOOD SUGARS THREE TIMES A DAY.  DX:250.03 10/06/17   Fulp, Cammie, MD  calcium citrate-vitamin D 500-400 MG-UNIT chewable tablet Chew 1 tablet by mouth 2 (two) times daily. 05/09/14   Funches, Adriana Mccallum, MD  Cholecalciferol ([facility] VIT D 5000 HIGH-POTENCY) 5000 units capsule Take 5,000 Units by mouth daily.    [provider]  cyclobenzaprine (FLEXERIL) 10 MG tablet TAKE 1 TABLET AT BEDTIME AS NEEDED FOR MUSCLE SPASM Patient taking  differently: Take 10 mg by mouth at bedtime as needed for muscle spasms.  11/16/18   Fulp, Cammie, MD  DEXILANT 60 MG capsule Take 60 mg by mouth daily. 06/09/17   [provider]  diphenoxylate-atropine (LOMOTIL) 2.5-0.025 MG tablet TAKE 2 TABLETS BY MOUTH 2 TIMES DAILY AS NEEDED FOR DIARRHEA OR LOOSE STOOLS. 01/14/19   Charlott Rakes, MD  DULoxetine (CYMBALTA) 60 MG capsule Take 1 cap by mouth per day. 12/22/18   Fulp, Cammie, MD  fluticasone (FLONASE) 50 MCG/ACT nasal spray Place 2 sprays into both nostrils daily. 10/18/18   Fulp, Cammie, MD  glucose blood (ACCU-CHEK AVIVA PLUS) test strip USE TO CHECK BLOOD SUGAR BEFORE MEALS AND AT BEDTIME dx code E10.65 10/06/17   Fulp, Cammie, MD  hydrALAZINE (APRESOLINE) 50 MG tablet TAKE ONE TABLET (50MG) BY MOUTH THREE TIMES DAILY 01/31/19   Fulp, Cammie, MD  hydrALAZINE (APRESOLINE) 50 MG tablet Take 1 tablet (50 mg total) by mouth 3 (three) times daily. 01/31/19 01/31/20  Fulp, Cammie, MD  insulin aspart (NOVOLOG FLEXPEN) 100 UNIT/ML FlexPen Inject 10 Units into the skin 3 (three) times daily with meals. 01/04/19   Fulp, Cammie, MD  insulin lispro (HUMALOG KWIKPEN) 100 UNIT/ML KiwkPen Inject 0.13 mLs (13 Units total) into the skin 3 (three) times daily. Patient not taking: Reported on 11/24/2018 11/04/17   Charlott Rakes, MD  Insulin Pen Needle (TRUEPLUS PEN NEEDLES) 32G X 4 MM MISC Use to inject insulin as directed. 12/01/18   Fulp, Cammie, MD  LANTUS SOLOSTAR 100 UNIT/ML Solostar Pen INJECT 50 UNITS SUBCUTANEOUSLY EVERY DAY 01/31/19   Fulp, Cammie, MD  oxyCODONE (OXY IR/ROXICODONE) 5 MG immediate release tablet Take 5 mg by mouth every 6 (six) hours as needed (pain).    [provider]  promethazine (PHENERGAN) 12.5 MG tablet Take 1 tablet (12.5 mg total) by mouth every 8 (eight) hours as needed for nausea or vomiting. 11/24/18   Fulp, Cammie, MD  topiramate (TOPAMAX) 25 MG tablet Take 1 tablet (25 mg total) by mouth 2 (two) times daily. Patient  not taking: Reported on 05/10/2018 06/25/17   Garvin Fila, MD  zolpidem (AMBIEN) 5 MG tablet Take 1 tablet (5 mg total) by mouth at bedtime as needed for sleep. 01/14/19   Charlott Rakes, MD     Family History  Problem Relation Age of Onset  . Diabetes Mother   . Heart attack Mother   . Heart disease Mother        before age 11  . Hypertension Mother   . Hyperlipidemia Mother   . Diabetes Father   . Heart attack Father   . Heart disease Father        before age 80  . Diabetes Sister   . Hypertension Sister     Social History   Socioeconomic History  .  Marital status: Divorced    Spouse name: Not on file  . Number of children: 1  . Years of education: Not on file  . Highest education level: Not on file  Occupational History    Employer: [employer]  . Financial resource strain: Not on file  . Food insecurity    Worry: Not on file    Inability: Not on file  . Transportation needs    Medical: Not on file    Non-medical: Not on file  Tobacco Use  . Smoking status: Light Tobacco Smoker    Packs/day: 0.25    Years: 22.00    Pack years: 5.50    Types: Cigarettes  . Smokeless tobacco: Never Used  Substance and Sexual Activity  . Alcohol use: Yes    Alcohol/week: 0.0 standard drinks    Comment: sociial  . Drug use: No  . Sexual activity: Yes    Partners: Male  Lifestyle  . Physical activity    Days per week: Not on file    Minutes per session: Not on file  . Stress: Not on file  Relationships  . Social Herbalist on phone: Not on file    Gets together: Not on file    Attends religious service: Not on file    Active member of club or organization: Not on file    Attends meetings of clubs or organizations: Not on file    Relationship status: Not on file  Other Topics Concern  . Not on file  Social History Narrative   Lives at home with her daughter   Right handed   Caffeine: 3 cups daily      Review of Systems: A 12 point ROS discussed  and pertinent positives are indicated in the HPI above.  All other systems are negative.  Review of Systems  Vital Signs: BP (!) 177/57 (BP Location: Left Arm)   Pulse 68   SpO2 98%   Physical Exam Targeted exam of the left foot shows that the PT and DP pulses are maintained with good signal. She has persisting circular wound on the heel, ~3-4cm diameter, with deep crater margin, but has clearly had some centripital healing from prior.  No new wound.     Imaging: No results found.  Labs:  CBC: Recent Labs    10/29/18 1439 12/17/18 0907  WBC 13.4* 21.0*  HGB 12.0 9.2*  HCT 36.6 29.7*  PLT 345 404*    COAGS: Recent Labs    12/17/18 0907  INR 1.2    BMP: Recent Labs    10/29/18 1439 12/17/18 0907  NA 140 136  K 4.7 4.4  CL 104 106  CO2 21 20*  GLUCOSE 121* 190*  BUN 43* 44*  CALCIUM 9.3 8.6*  CREATININE 3.07* 4.18*  GFRNONAA 17* 12*  GFRAA 19* 13*    LIVER FUNCTION TESTS: Recent Labs    10/29/18 1439  BILITOT 0.3  AST 11  ALT 9  ALKPHOS 142*  PROT 7.3  ALBUMIN 4.2    TUMOR MARKERS: No results for input(s): AFPTM, CEA, CA199, CHROMGRNA in the last 8760 hours.   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    Her wound has had good progression since prior, though she is not seeing any podiatry or wound care at this time.    I did  encourage her to seek some specialized care for the wound, at the very least to discuss off-loading techniques of her boot/footwear, and to monitor her wound.  At the very least, I encouraged her to call our office with any concerns for regression or plateau.    Regarding medical management, maximal medical therapy for reduction of risk factors is indicated as recommended by updated AHA guidelines1.  This includes anti-platelet medication, tight blood glucose control to a HbA1c < 7,  tight blood pressure control, maximum-dose HMG-CoA reductase inhibitor, and smoking cessation.    Smoking cessation was again addressed.  She does not seem very motivated to completely quit at this time, though does tell me that she has decreased from 2 ppd previously to ~6 cigarettes / day current, for which I congratulated her. I did remind her that the longevity of our solution will be shortened by tobacco use.    Annual flu vaccination is also recommended, with Class 1 recommendation1.   Plan: - 6 month follow up with repeat ABI/segmental - Continued maximal medical therapy  -Recommend smoking cessation measures, with options discussed.    ___________________________________________________________________   1Morley Kos MD, et al. 2016 AHA/ACC Guideline on the Management of Patients With Lower Extremity Peripheral Artery Disease: Executive Summary: A Report of the American College of Cardiology/American Heart Association Task Force on Clinical Practice Guidelines. J Am Coll Cardiol. 2017 Mar 21;69(11):1465-1508. doi: 10.1016/j.jacc.2016.11.008.   2 - Norgren L, et al. TASC II Working Group. Inter-society consensus for the management of peripheral arterial disease. Int Tressia Miners. 2007 Jun;26(2):81-157. Review. PubMed PMID: 16010932  3 - Hingorani A, et al. The management of diabetic foot: A clinical practice guideline by the Society for Vascular Surgery in collaboration with the [place] and the Society  for Vascular Medicine. J Vasc Surg. 2016 Feb;63(2 Suppl):3S-21S. doi: 10.1016/j.jvs.2015.10.003. PubMed PMID: 35573220.  4 - Corinna Gab, Saab FA, Luberta Mutter, Grant Ruts, Ewell Poe, Driver VR, [place], Lookstein R, van den Baldemar Lenis, Jaff MR, Guadalupe Dawn, Henao S, AlMahameed A, Katzen B. Digital Subtraction Angiography Prior to an Amputation for Critical Limb Ischemia (CLI): An Expert Recommendation Statement From the CLI Global Society to Optimize Limb Salvage. J  Endovasc Ther. 2020 Aug;27(4):540-546. doi: 10.1177/1526602820928590. Epub 2020 May 29. PMID: 25427062.      Electronically Signed: Corrie Mckusick 02/08/2019, 4:33 PM   I spent a total of    25 Minutes in face to face in clinical consultation, greater than 50% of which was counseling/coordinating care for LLE foot wound, CLI/CLTI, SP treatment with endovascular repair of SFA disease

## 2019-02-11 DIAGNOSIS — Z79891 Long term (current) use of opiate analgesic: Secondary | ICD-10-CM | POA: Diagnosis not present

## 2019-02-11 DIAGNOSIS — G546 Phantom limb syndrome with pain: Secondary | ICD-10-CM | POA: Diagnosis not present

## 2019-02-11 DIAGNOSIS — Z79899 Other long term (current) drug therapy: Secondary | ICD-10-CM | POA: Diagnosis not present

## 2019-02-11 DIAGNOSIS — G894 Chronic pain syndrome: Secondary | ICD-10-CM | POA: Diagnosis not present

## 2019-02-17 ENCOUNTER — Telehealth: Payer: Self-pay

## 2019-02-17 NOTE — Telephone Encounter (Signed)
Orders for incontinence supplies faxed to [facility].  

## 2019-02-21 ENCOUNTER — Other Ambulatory Visit: Payer: Self-pay | Admitting: Family Medicine

## 2019-03-09 DIAGNOSIS — R32 Unspecified urinary incontinence: Secondary | ICD-10-CM | POA: Diagnosis not present

## 2019-03-22 ENCOUNTER — Other Ambulatory Visit: Payer: Self-pay | Admitting: Family Medicine

## 2019-03-22 DIAGNOSIS — G8929 Other chronic pain: Secondary | ICD-10-CM

## 2019-03-22 DIAGNOSIS — E1140 Type 2 diabetes mellitus with diabetic neuropathy, unspecified: Secondary | ICD-10-CM

## 2019-03-22 DIAGNOSIS — K580 Irritable bowel syndrome with diarrhea: Secondary | ICD-10-CM

## 2019-03-22 DIAGNOSIS — M545 Low back pain, unspecified: Secondary | ICD-10-CM

## 2019-03-23 DIAGNOSIS — G894 Chronic pain syndrome: Secondary | ICD-10-CM | POA: Diagnosis not present

## 2019-03-23 DIAGNOSIS — Z79899 Other long term (current) drug therapy: Secondary | ICD-10-CM | POA: Diagnosis not present

## 2019-03-23 DIAGNOSIS — G546 Phantom limb syndrome with pain: Secondary | ICD-10-CM | POA: Diagnosis not present

## 2019-03-29 DIAGNOSIS — N184 Chronic kidney disease, stage 4 (severe): Secondary | ICD-10-CM | POA: Diagnosis not present

## 2019-03-29 DIAGNOSIS — I129 Hypertensive chronic kidney disease with stage 1 through stage 4 chronic kidney disease, or unspecified chronic kidney disease: Secondary | ICD-10-CM | POA: Diagnosis not present

## 2019-03-29 DIAGNOSIS — D631 Anemia in chronic kidney disease: Secondary | ICD-10-CM | POA: Diagnosis not present

## 2019-03-29 DIAGNOSIS — N189 Chronic kidney disease, unspecified: Secondary | ICD-10-CM | POA: Diagnosis not present

## 2019-03-29 DIAGNOSIS — N2581 Secondary hyperparathyroidism of renal origin: Secondary | ICD-10-CM | POA: Diagnosis not present

## 2019-04-08 DIAGNOSIS — R32 Unspecified urinary incontinence: Secondary | ICD-10-CM | POA: Diagnosis not present

## 2019-04-19 ENCOUNTER — Other Ambulatory Visit: Payer: Self-pay | Admitting: Family Medicine

## 2019-04-19 DIAGNOSIS — M545 Low back pain, unspecified: Secondary | ICD-10-CM

## 2019-04-19 DIAGNOSIS — E1140 Type 2 diabetes mellitus with diabetic neuropathy, unspecified: Secondary | ICD-10-CM

## 2019-04-19 DIAGNOSIS — G8929 Other chronic pain: Secondary | ICD-10-CM

## 2019-04-22 DIAGNOSIS — Z79899 Other long term (current) drug therapy: Secondary | ICD-10-CM | POA: Diagnosis not present

## 2019-04-22 DIAGNOSIS — G894 Chronic pain syndrome: Secondary | ICD-10-CM | POA: Diagnosis not present

## 2019-04-22 DIAGNOSIS — G546 Phantom limb syndrome with pain: Secondary | ICD-10-CM | POA: Diagnosis not present

## 2019-04-22 DIAGNOSIS — K219 Gastro-esophageal reflux disease without esophagitis: Secondary | ICD-10-CM | POA: Diagnosis not present

## 2019-04-26 ENCOUNTER — Other Ambulatory Visit: Payer: Self-pay | Admitting: Family Medicine

## 2019-04-26 DIAGNOSIS — K580 Irritable bowel syndrome with diarrhea: Secondary | ICD-10-CM

## 2019-05-17 DIAGNOSIS — N185 Chronic kidney disease, stage 5: Secondary | ICD-10-CM | POA: Diagnosis not present

## 2019-05-17 DIAGNOSIS — N189 Chronic kidney disease, unspecified: Secondary | ICD-10-CM | POA: Diagnosis not present

## 2019-05-17 DIAGNOSIS — N2581 Secondary hyperparathyroidism of renal origin: Secondary | ICD-10-CM | POA: Diagnosis not present

## 2019-05-18 ENCOUNTER — Other Ambulatory Visit: Payer: Self-pay | Admitting: Family Medicine

## 2019-05-18 DIAGNOSIS — E1122 Type 2 diabetes mellitus with diabetic chronic kidney disease: Secondary | ICD-10-CM

## 2019-05-18 DIAGNOSIS — G8929 Other chronic pain: Secondary | ICD-10-CM

## 2019-05-18 DIAGNOSIS — N183 Chronic kidney disease, stage 3 unspecified: Secondary | ICD-10-CM

## 2019-05-18 DIAGNOSIS — E1140 Type 2 diabetes mellitus with diabetic neuropathy, unspecified: Secondary | ICD-10-CM

## 2019-05-18 DIAGNOSIS — I150 Renovascular hypertension: Secondary | ICD-10-CM

## 2019-05-18 DIAGNOSIS — M545 Low back pain, unspecified: Secondary | ICD-10-CM

## 2019-05-24 DIAGNOSIS — N189 Chronic kidney disease, unspecified: Secondary | ICD-10-CM | POA: Diagnosis not present

## 2019-05-24 DIAGNOSIS — I120 Hypertensive chronic kidney disease with stage 5 chronic kidney disease or end stage renal disease: Secondary | ICD-10-CM | POA: Diagnosis not present

## 2019-05-24 DIAGNOSIS — N2581 Secondary hyperparathyroidism of renal origin: Secondary | ICD-10-CM | POA: Diagnosis not present

## 2019-05-24 DIAGNOSIS — D631 Anemia in chronic kidney disease: Secondary | ICD-10-CM | POA: Diagnosis not present

## 2019-05-24 DIAGNOSIS — N185 Chronic kidney disease, stage 5: Secondary | ICD-10-CM | POA: Diagnosis not present

## 2019-05-31 ENCOUNTER — Other Ambulatory Visit (HOSPITAL_COMMUNITY): Payer: Self-pay

## 2019-05-31 NOTE — Discharge Instructions (Signed)
  Epoetin Alfa injection What is this medicine? EPOETIN ALFA (e POE e tin AL fa) helps your body make more red blood cells. This medicine is used to treat anemia caused by chronic kidney disease, cancer chemotherapy, or HIV-therapy. It may also be used before surgery if you have anemia. This medicine may be used for other purposes; ask your health care provider or pharmacist if you have questions. COMMON BRAND NAME(S): Epogen, Procrit, Retacrit What should I tell my health care provider before I take this medicine? They need to know if you have any of these conditions:  cancer  heart disease  high blood pressure  history of blood clots  history of stroke  low levels of folate, iron, or vitamin B12 in the blood  seizures  an unusual or allergic reaction to erythropoietin, albumin, benzyl alcohol, hamster proteins, other medicines, foods, dyes, or preservatives  pregnant or trying to get pregnant  breast-feeding How should I use this medicine? This medicine is for injection into a vein or under the skin. It is usually given by a health care professional in a hospital or clinic setting. If you get this medicine at home, you will be taught how to prepare and give this medicine. Use exactly as directed. Take your medicine at regular intervals. Do not take your medicine more often than directed. It is important that you put your used needles and syringes in a special sharps container. Do not put them in a trash can. If you do not have a sharps container, call your pharmacist or healthcare provider to get one. A special MedGuide will be given to you by the pharmacist with each prescription and refill. Be sure to read this information carefully each time. Talk to your pediatrician regarding the use of this medicine in children. While this drug may be prescribed for selected conditions, precautions do apply. Overdosage: If you think you have taken too much of this medicine contact a  poison control center or emergency room at once. NOTE: This medicine is only for you. Do not share this medicine with others. What if I miss a dose? If you miss a dose, take it as soon as you can. If it is almost time for your next dose, take only that dose. Do not take double or extra doses. What may interact with this medicine? Interactions have not been studied. This list may not describe all possible interactions. Give your health care provider a list of all the medicines, herbs, non-prescription drugs, or dietary supplements you use. Also tell them if you smoke, drink alcohol, or use illegal drugs. Some items may interact with your medicine. What should I watch for while using this medicine? Your condition will be monitored carefully while you are receiving this medicine. You may need blood work done while you are taking this medicine. This medicine may cause a decrease in vitamin B6. You should make sure that you get enough vitamin B6 while you are taking this medicine. Discuss the foods you eat and the vitamins you take with your health care professional. What side effects may I notice from receiving this medicine? Side effects that you should report to your doctor or health care professional as soon as possible:  allergic reactions like skin rash, itching or hives, swelling of the face, lips, or tongue  seizures  signs and symptoms of a blood clot such as breathing problems; changes in vision; chest pain; severe, sudden headache; pain, swelling, warmth in the leg; trouble speaking; sudden   numbness or weakness of the face, arm or leg  signs and symptoms of a stroke like changes in vision; confusion; trouble speaking or understanding; severe headaches; sudden numbness or weakness of the face, arm or leg; trouble walking; dizziness; loss of balance or coordination Side effects that usually do not require medical attention (report to your doctor or health care professional if they continue  or are bothersome):  chills  cough  dizziness  fever  headaches  joint pain  muscle cramps  muscle pain  nausea, vomiting  pain, redness, or irritation at site where injected This list may not describe all possible side effects. Call your doctor for medical advice about side effects. You may report side effects to FDA at 1-800-FDA-1088. Where should I keep my medicine? Keep out of the reach of children. Store in a refrigerator between 2 and 8 degrees C (36 and 46 degrees F). Do not freeze or shake. Throw away any unused portion if using a single-dose vial. Multi-dose vials can be kept in the refrigerator for up to 21 days after the initial dose. Throw away unused medicine. NOTE: This sheet is a summary. It may not cover all possible information. If you have questions about this medicine, talk to your doctor, pharmacist, or health care provider.  2020 Elsevier/Gold Standard (2016-09-26 08:35:19) Ferumoxytol injection What is this medicine? FERUMOXYTOL is an iron complex. Iron is used to make healthy red blood cells, which carry oxygen and nutrients throughout the body. This medicine is used to treat iron deficiency anemia. This medicine may be used for other purposes; ask your health care provider or pharmacist if you have questions. COMMON BRAND NAME(S): Feraheme What should I tell my health care provider before I take this medicine? They need to know if you have any of these conditions:  anemia not caused by low iron levels  high levels of iron in the blood  magnetic resonance imaging (MRI) test scheduled  an unusual or allergic reaction to iron, other medicines, foods, dyes, or preservatives  pregnant or trying to get pregnant  breast-feeding How should I use this medicine? This medicine is for injection into a vein. It is given by a health care professional in a hospital or clinic setting. Talk to your pediatrician regarding the use of this medicine in children.  Special care may be needed. Overdosage: If you think you have taken too much of this medicine contact a poison control center or emergency room at once. NOTE: This medicine is only for you. Do not share this medicine with others. What if I miss a dose? It is important not to miss your dose. Call your doctor or health care professional if you are unable to keep an appointment. What may interact with this medicine? This medicine may interact with the following medications:  other iron products This list may not describe all possible interactions. Give your health care provider a list of all the medicines, herbs, non-prescription drugs, or dietary supplements you use. Also tell them if you smoke, drink alcohol, or use illegal drugs. Some items may interact with your medicine. What should I watch for while using this medicine? Visit your doctor or healthcare professional regularly. Tell your doctor or healthcare professional if your symptoms do not start to get better or if they get worse. You may need blood work done while you are taking this medicine. You may need to follow a special diet. Talk to your doctor. Foods that contain iron include: whole grains/cereals, dried fruits,   beans, or peas, leafy green vegetables, and organ meats (liver, kidney). What side effects may I notice from receiving this medicine? Side effects that you should report to your doctor or health care professional as soon as possible:  allergic reactions like skin rash, itching or hives, swelling of the face, lips, or tongue  breathing problems  changes in blood pressure  feeling faint or lightheaded, falls  fever or chills  flushing, sweating, or hot feelings  swelling of the ankles or feet Side effects that usually do not require medical attention (report to your doctor or health care professional if they continue or are bothersome):  diarrhea  headache  nausea, vomiting  stomach pain This list may not  describe all possible side effects. Call your doctor for medical advice about side effects. You may report side effects to FDA at 1-800-FDA-1088. Where should I keep my medicine? This drug is given in a hospital or clinic and will not be stored at home. NOTE: This sheet is a summary. It may not cover all possible information. If you have questions about this medicine, talk to your doctor, pharmacist, or health care provider.  2020 Elsevier/Gold Standard (2016-04-07 20:21:10)  

## 2019-06-01 ENCOUNTER — Ambulatory Visit (HOSPITAL_COMMUNITY)
Admission: RE | Admit: 2019-06-01 | Discharge: 2019-06-01 | Disposition: A | Discharge status: Home / Self Care | Payer: Medicaid Other | Source: Ambulatory Visit | Attending: Nephrology | Admitting: Nephrology

## 2019-06-01 ENCOUNTER — Other Ambulatory Visit: Payer: Self-pay

## 2019-06-01 VITALS — BP 170/52 | HR 76 | Temp 98.8°F | Resp 18

## 2019-06-01 DIAGNOSIS — N183 Chronic kidney disease, stage 3 unspecified: Secondary | ICD-10-CM | POA: Insufficient documentation

## 2019-06-01 DIAGNOSIS — E1122 Type 2 diabetes mellitus with diabetic chronic kidney disease: Secondary | ICD-10-CM | POA: Insufficient documentation

## 2019-06-01 LAB — POCT HEMOGLOBIN-HEMACUE: Hemoglobin: 9.6 g/dL — ABNORMAL LOW (ref 12.0–15.0)

## 2019-06-01 MED ORDER — SODIUM CHLORIDE 0.9 % IV SOLN
510.0000 mg | INTRAVENOUS | Status: DC
  Administered 2019-06-01: 510 mg via INTRAVENOUS
  Filled 2019-06-01: qty 17

## 2019-06-01 MED ORDER — EPOETIN ALFA-EPBX 10000 UNIT/ML IJ SOLN: 10000.0000 [IU] | INTRAMUSCULAR | Status: DC

## 2019-06-01 MED ORDER — EPOETIN ALFA-EPBX 10000 UNIT/ML IJ SOLN
INTRAMUSCULAR | Status: AC
  Administered 2019-06-01: 10000 [IU] via SUBCUTANEOUS
  Filled 2019-06-01: qty 1

## 2019-06-08 ENCOUNTER — Encounter (HOSPITAL_COMMUNITY)
Admission: RE | Admit: 2019-06-08 | Discharge: 2019-06-08 | Disposition: A | Discharge status: Home / Self Care | Payer: Medicaid Other | Source: Ambulatory Visit | Attending: Nephrology | Admitting: Nephrology

## 2019-06-08 ENCOUNTER — Other Ambulatory Visit: Payer: Self-pay

## 2019-06-08 VITALS — BP 173/68 | HR 63 | Temp 97.4°F | Resp 18 | Ht 70.0 in | Wt 200.0 lb

## 2019-06-08 DIAGNOSIS — E1122 Type 2 diabetes mellitus with diabetic chronic kidney disease: Secondary | ICD-10-CM | POA: Diagnosis not present

## 2019-06-08 DIAGNOSIS — N183 Chronic kidney disease, stage 3 unspecified: Secondary | ICD-10-CM | POA: Diagnosis not present

## 2019-06-08 LAB — POCT HEMOGLOBIN-HEMACUE: Hemoglobin: 8.7 g/dL — ABNORMAL LOW (ref 12.0–15.0)

## 2019-06-08 MED ORDER — SODIUM CHLORIDE 0.9 % IV SOLN
510.0000 mg | INTRAVENOUS | Status: AC
  Administered 2019-06-08: 510 mg via INTRAVENOUS
  Filled 2019-06-08: qty 17

## 2019-06-08 MED ORDER — EPOETIN ALFA-EPBX 10000 UNIT/ML IJ SOLN
10000.0000 [IU] | INTRAMUSCULAR | Status: DC
  Administered 2019-06-08: 10000 [IU] via SUBCUTANEOUS

## 2019-06-08 MED ORDER — EPOETIN ALFA-EPBX 10000 UNIT/ML IJ SOLN
INTRAMUSCULAR | Status: AC
  Filled 2019-06-08: qty 1

## 2019-06-09 DIAGNOSIS — R32 Unspecified urinary incontinence: Secondary | ICD-10-CM | POA: Diagnosis not present

## 2019-06-15 ENCOUNTER — Encounter (HOSPITAL_COMMUNITY): Payer: Medicaid Other

## 2019-06-19 ENCOUNTER — Other Ambulatory Visit: Payer: Self-pay | Admitting: Family Medicine

## 2019-06-19 DIAGNOSIS — E1140 Type 2 diabetes mellitus with diabetic neuropathy, unspecified: Secondary | ICD-10-CM

## 2019-06-19 DIAGNOSIS — N183 Chronic kidney disease, stage 3 unspecified: Secondary | ICD-10-CM

## 2019-06-19 DIAGNOSIS — I150 Renovascular hypertension: Secondary | ICD-10-CM

## 2019-06-19 DIAGNOSIS — M545 Low back pain, unspecified: Secondary | ICD-10-CM

## 2019-06-19 DIAGNOSIS — E1122 Type 2 diabetes mellitus with diabetic chronic kidney disease: Secondary | ICD-10-CM

## 2019-06-19 DIAGNOSIS — G8929 Other chronic pain: Secondary | ICD-10-CM

## 2019-06-20 DIAGNOSIS — Z79899 Other long term (current) drug therapy: Secondary | ICD-10-CM | POA: Diagnosis not present

## 2019-06-20 DIAGNOSIS — G894 Chronic pain syndrome: Secondary | ICD-10-CM | POA: Diagnosis not present

## 2019-06-20 DIAGNOSIS — G546 Phantom limb syndrome with pain: Secondary | ICD-10-CM | POA: Diagnosis not present

## 2019-06-22 DIAGNOSIS — Z114 Encounter for screening for human immunodeficiency virus [HIV]: Secondary | ICD-10-CM | POA: Diagnosis not present

## 2019-06-22 DIAGNOSIS — Z13220 Encounter for screening for lipoid disorders: Secondary | ICD-10-CM | POA: Diagnosis not present

## 2019-06-22 DIAGNOSIS — Z1159 Encounter for screening for other viral diseases: Secondary | ICD-10-CM | POA: Diagnosis not present

## 2019-06-22 DIAGNOSIS — E1140 Type 2 diabetes mellitus with diabetic neuropathy, unspecified: Secondary | ICD-10-CM | POA: Diagnosis not present

## 2019-06-22 DIAGNOSIS — D539 Nutritional anemia, unspecified: Secondary | ICD-10-CM | POA: Diagnosis not present

## 2019-06-22 DIAGNOSIS — E559 Vitamin D deficiency, unspecified: Secondary | ICD-10-CM | POA: Diagnosis not present

## 2019-07-25 DIAGNOSIS — N2581 Secondary hyperparathyroidism of renal origin: Secondary | ICD-10-CM | POA: Diagnosis not present

## 2019-07-25 DIAGNOSIS — I120 Hypertensive chronic kidney disease with stage 5 chronic kidney disease or end stage renal disease: Secondary | ICD-10-CM | POA: Diagnosis not present

## 2019-07-25 DIAGNOSIS — N185 Chronic kidney disease, stage 5: Secondary | ICD-10-CM | POA: Diagnosis not present

## 2019-07-25 DIAGNOSIS — N189 Chronic kidney disease, unspecified: Secondary | ICD-10-CM | POA: Diagnosis not present

## 2019-07-25 DIAGNOSIS — D631 Anemia in chronic kidney disease: Secondary | ICD-10-CM | POA: Diagnosis not present

## 2019-07-25 DIAGNOSIS — T8789 Other complications of amputation stump: Secondary | ICD-10-CM | POA: Diagnosis not present

## 2019-08-03 DIAGNOSIS — K580 Irritable bowel syndrome with diarrhea: Secondary | ICD-10-CM | POA: Diagnosis not present

## 2019-08-03 DIAGNOSIS — K219 Gastro-esophageal reflux disease without esophagitis: Secondary | ICD-10-CM | POA: Diagnosis not present

## 2019-08-03 DIAGNOSIS — Z1211 Encounter for screening for malignant neoplasm of colon: Secondary | ICD-10-CM | POA: Diagnosis not present

## 2019-08-04 ENCOUNTER — Other Ambulatory Visit: Payer: Self-pay | Admitting: Interventional Radiology

## 2019-08-04 DIAGNOSIS — E09621 Drug or chemical induced diabetes mellitus with foot ulcer: Secondary | ICD-10-CM

## 2019-08-04 DIAGNOSIS — I70229 Atherosclerosis of native arteries of extremities with rest pain, unspecified extremity: Secondary | ICD-10-CM

## 2019-08-04 DIAGNOSIS — L97508 Non-pressure chronic ulcer of other part of unspecified foot with other specified severity: Secondary | ICD-10-CM

## 2019-08-26 ENCOUNTER — Ambulatory Visit: Payer: Medicaid Other | Admitting: Podiatry

## 2019-08-26 ENCOUNTER — Ambulatory Visit (INDEPENDENT_AMBULATORY_CARE_PROVIDER_SITE_OTHER): Payer: Medicaid Other

## 2019-08-26 ENCOUNTER — Other Ambulatory Visit: Payer: Self-pay

## 2019-08-26 DIAGNOSIS — E1165 Type 2 diabetes mellitus with hyperglycemia: Secondary | ICD-10-CM

## 2019-08-26 DIAGNOSIS — L97425 Non-pressure chronic ulcer of left heel and midfoot with muscle involvement without evidence of necrosis: Secondary | ICD-10-CM

## 2019-08-26 DIAGNOSIS — IMO0002 Reserved for concepts with insufficient information to code with codable children: Secondary | ICD-10-CM

## 2019-08-26 DIAGNOSIS — E11621 Type 2 diabetes mellitus with foot ulcer: Secondary | ICD-10-CM

## 2019-08-26 DIAGNOSIS — M79672 Pain in left foot: Secondary | ICD-10-CM

## 2019-08-26 DIAGNOSIS — E118 Type 2 diabetes mellitus with unspecified complications: Secondary | ICD-10-CM

## 2019-08-27 ENCOUNTER — Encounter: Payer: Self-pay | Admitting: Podiatry

## 2019-08-27 NOTE — Progress Notes (Signed)
Subjective:  Patient ID: [name], female    DOB: May 13, 1965,  MRN: [id number]  No chief complaint on file.   54 y.o. female presents for wound care.  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  Patient states that this wound has been going on for quite some time.  Patient has been to to the wound care center which has not helped.  Patient is concerned that the wound is not closing up.  She has been ambulating without any canal immobilization.  She denies any kind of acute treatment options for this.  She has been getting the wound wet.  She has not been applying any kind of dressings to it.  She denies any other acute complaints.  Patient is a diabetic with last A1c of 6.7.   Review of Systems: Negative except as noted in the HPI. Denies N/V/F/Ch.  Past Medical History:  Diagnosis Date  . Anxiety 2002  . Chest tightness   . Depression 2001  . Diabetes type 1, uncontrolled ([place])    at age 61  . Diabetic neuropathy ([place])   . Essential hypertension 2015  . GERD (gastroesophageal reflux disease)    about age of 15  . Nausea and vomiting in adult   . Stroke ([place])   . Urinary frequency   . Yeast vaginitis     Current Outpatient Medications:  .  acetaminophen-codeine (TYLENOL #3) 300-30 MG tablet, TAKE 1 TABLET EVERY 8 HOURS AS NEEDED FOR MODERATE PAIN (INS WILL ONLY PAY FOR 21), Disp: 21 tablet, Rfl: 1 .  alosetron (LOTRONEX) 0.5 MG tablet, Take 0.5 mg by mouth 2 (two) times daily., Disp: , Rfl: 6 .  aluminum chloride (DRYSOL) 20 % external solution, Apply topically at bedtime., Disp: 35 mL, Rfl: 5 .  amLODipine (NORVASC) 5 MG tablet, Take 1 tablet (5 mg total) by mouth daily., Disp: 30 tablet, Rfl: 5 .  aspirin EC 81 MG EC tablet, Take 1 tablet (81 mg total) by mouth daily., Disp: 30 tablet, Rfl: 0 .  atorvastatin (LIPITOR) 40 MG tablet, TAKE 1 TABLET (40 MG TOTAL) BY MOUTH DAILY AT 6 PM., Disp: 30 tablet, Rfl: 1 .   Blood Glucose Monitoring Suppl (ACCU-CHEK AVIVA PLUS) w/Device KIT, CHECK BLOOD SUGARS THREE TIMES A DAY.  DX:250.03, Disp: 1 kit, Rfl: 0 .  calcium citrate-vitamin D 500-400 MG-UNIT chewable tablet, Chew 1 tablet by mouth 2 (two) times daily., Disp: 180 tablet, Rfl: 1 .  Cholecalciferol (CVS VIT D 5000 HIGH-POTENCY) 5000 units capsule, Take 5,000 Units by mouth daily., Disp: , Rfl:  .  cyclobenzaprine (FLEXERIL) 10 MG tablet, TAKE 1 TABLET AT BEDTIME AS NEEDED FOR MUSCLE SPASM (Patient taking differently: Take 10 mg by mouth at bedtime as needed for muscle spasms. ), Disp: 30 tablet, Rfl: 5 .  DEXILANT 60 MG capsule, Take 60 mg by mouth daily., Disp: , Rfl: 6 .  diphenoxylate-atropine (LOMOTIL) 2.5-0.025 MG tablet, 1 or 2 pills twice daily as needed for loose stools/diarrhea.  Office visit needed, Disp: 60 tablet, Rfl: 0 .  DULoxetine (CYMBALTA) 60 MG capsule, TAKE 1 CAPSULE BY MOUTH EVERY DAY. Please make PCP appointment., Disp: 30 capsule, Rfl: 0 .  fluticasone (FLONASE) 50 MCG/ACT nasal spray, Place 2 sprays into both nostrils daily., Disp: 1 g, Rfl: 0 .  glucose blood (ACCU-CHEK AVIVA PLUS) test strip, USE TO CHECK BLOOD SUGAR BEFORE MEALS AND AT BEDTIME dx code E10.65, Disp: 125 each,  Rfl: 6 .  hydrALAZINE (APRESOLINE) 50 MG tablet, TAKE ONE TABLET (50MG) BY MOUTH THREE TIMES DAILY, Disp: 90 tablet, Rfl: 11 .  hydrALAZINE (APRESOLINE) 50 MG tablet, Take 1 tablet (50 mg total) by mouth 3 (three) times daily., Disp: 90 tablet, Rfl: 5 .  insulin aspart (NOVOLOG FLEXPEN) 100 UNIT/ML FlexPen, Inject 10 Units into the skin 3 (three) times daily with meals., Disp: 15 mL, Rfl: 4 .  insulin lispro (HUMALOG KWIKPEN) 100 UNIT/ML KiwkPen, Inject 0.13 mLs (13 Units total) into the skin 3 (three) times daily., Disp: 15 mL, Rfl: 6 .  Insulin Pen Needle (TRUEPLUS PEN NEEDLES) 32G X 4 MM MISC, Use to inject insulin as directed., Disp: 100 each, Rfl: 11 .  LANTUS SOLOSTAR 100 UNIT/ML Solostar Pen, INJECT 50 UNITS  SUBCUTANEOUSLY EVERY DAY, Disp: 15 mL, Rfl: 3 .  oxyCODONE (OXY IR/ROXICODONE) 5 MG immediate release tablet, Take 5 mg by mouth every 6 (six) hours as needed (pain)., Disp: , Rfl:  .  promethazine (PHENERGAN) 12.5 MG tablet, Take 1 tablet (12.5 mg total) by mouth every 8 (eight) hours as needed for nausea or vomiting., Disp: 20 tablet, Rfl: 0 .  topiramate (TOPAMAX) 25 MG tablet, Take 1 tablet (25 mg total) by mouth 2 (two) times daily., Disp: 120 tablet, Rfl: 3 .  zolpidem (AMBIEN) 5 MG tablet, Take 1 tablet (5 mg total) by mouth at bedtime as needed for sleep., Disp: 30 tablet, Rfl: 2  Social History   Tobacco Use  Smoking Status Light Tobacco Smoker  . Packs/day: 0.25  . Years: 22.00  . Pack years: 5.50  . Types: Cigarettes  Smokeless Tobacco Never Used    Allergies  Allergen Reactions  . Lidocaine Itching  . Trazodone Swelling  . Latex Rash   Objective:  There were no vitals filed for this visit. There is no height or weight on file to calculate BMI. Constitutional Well developed. Well nourished.  Vascular Dorsalis pedis pulses palpable bilaterally. Posterior tibial pulses palpable bilaterally. Capillary refill normal to all digits.  No cyanosis or clubbing noted. Pedal hair growth normal.  Neurologic Normal speech. Oriented to person, place, and time. Protective sensation absent  Dermatologic Wound Location: Left Chopart amputation site and wound to the plantar foot Wound Base: Mixed Granular/Fibrotic Peri-wound: Calloused Exudate: Scant/small amount Serosanguinous exudate Wound Measurements: -See below  Orthopedic: No pain to palpation either foot.   Radiographs: 3 views of skeletally mature adult left foot: No cortical irregularity or destruction noted.  No concern for osteomyelitis noted. Assessment:   1. Foot pain, left   2. Diabetes mellitus type 2, uncontrolled, with complications ([place])   3. 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  Ulcer left Chopart amputation site plantar foot wound -Debridement as below. -Dressed with Betadine wet-to-dry, DSD. -I have asked the patient to obtain knee scooter to be nonweightbearing to the left lower extremity as this will ultimately help heal the wound.  Patient will obtain knee scooter -Patient is a high risk of losing the leg if the wound worsens or the infection creeps up.  I discussed this with the patient that he is she is a high risk of losing the leg given the amputation that was already done previously  Patient states understanding.   Procedure: Excisional Debridement of Wound Tool: Sharp chisel blade/tissue nipper Rationale: Removal of  non-viable soft tissue from the wound to promote healing.  Anesthesia: none Pre-Debridement Wound Measurements: 1.5 cm x 1.5 cm x 0.7 cm  Post-Debridement Wound Measurements: 1.7 cm x 1.7 cm x 0.7 cm  Type of Debridement: Sharp Excisional Tissue Removed: Non-viable soft tissue Blood loss: Minimal (<50cc) Depth of Debridement: Muscle Technique: Sharp excisional debridement to bleeding, viable wound base.  Wound Progress: This is my initial encounter.  I will continue to monitor the progression of the wound. Dressing: Dry, sterile, compression dressing. Disposition: Patient tolerated procedure well. Patient to return in 1 week for follow-up.  No follow-ups on file.

## 2019-08-31 ENCOUNTER — Ambulatory Visit
Admission: RE | Admit: 2019-08-31 | Discharge: 2019-08-31 | Disposition: A | Discharge status: Home / Self Care | Payer: Medicaid Other | Source: Ambulatory Visit | Attending: Interventional Radiology | Admitting: Interventional Radiology

## 2019-08-31 ENCOUNTER — Other Ambulatory Visit: Payer: Self-pay

## 2019-08-31 ENCOUNTER — Other Ambulatory Visit: Payer: Medicaid Other

## 2019-08-31 ENCOUNTER — Encounter: Payer: Self-pay | Admitting: *Deleted

## 2019-08-31 DIAGNOSIS — E09621 Drug or chemical induced diabetes mellitus with foot ulcer: Secondary | ICD-10-CM

## 2019-08-31 DIAGNOSIS — I70229 Atherosclerosis of native arteries of extremities with rest pain, unspecified extremity: Secondary | ICD-10-CM

## 2019-09-01 ENCOUNTER — Encounter: Payer: Self-pay | Admitting: Radiology

## 2019-09-02 ENCOUNTER — Other Ambulatory Visit: Payer: Self-pay

## 2019-09-02 ENCOUNTER — Ambulatory Visit: Payer: Medicaid Other | Admitting: Podiatry

## 2019-09-02 DIAGNOSIS — E08621 Diabetes mellitus due to underlying condition with foot ulcer: Secondary | ICD-10-CM

## 2019-09-02 DIAGNOSIS — M79672 Pain in left foot: Secondary | ICD-10-CM

## 2019-09-02 DIAGNOSIS — IMO0002 Reserved for concepts with insufficient information to code with codable children: Secondary | ICD-10-CM

## 2019-09-02 DIAGNOSIS — L97422 Non-pressure chronic ulcer of left heel and midfoot with fat layer exposed: Secondary | ICD-10-CM

## 2019-09-02 DIAGNOSIS — E1165 Type 2 diabetes mellitus with hyperglycemia: Secondary | ICD-10-CM

## 2019-09-06 ENCOUNTER — Telehealth: Payer: Self-pay | Admitting: *Deleted

## 2019-09-06 ENCOUNTER — Encounter: Payer: Self-pay | Admitting: Podiatry

## 2019-09-06 NOTE — Telephone Encounter (Signed)
Faxed and emailed required forms, demographics and demographics to [facility].

## 2019-09-06 NOTE — Progress Notes (Signed)
Subjective:  Patient ID: [name], female    DOB: 03-14-1965,  MRN: [id number]  Chief Complaint  Patient presents with  . Wound Check    pt is here for a wound check of the left foot, pt states that the foot is doing alot better since the last time she was here. Pt is concerned that it is overall painful to put pressure on.    54 y.o. female presents for wound care.  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  Patient states that it is looking somewhat better.  Patient has been doing Betadine wet-to-dry dressing changes patient has obtained the scooter and will start using it now.  Patient states is mildly painful to pressure.  Pain scale is 4 out of 10.  She denies any other acute complaints.   Review of Systems: Negative except as noted in the HPI. Denies N/V/F/Ch.  Past Medical History:  Diagnosis Date  . Anxiety 2002  . Chest tightness   . Depression 2001  . Diabetes type 1, uncontrolled ([place])    at age 66  . Diabetic neuropathy ([place])   . Essential hypertension 2015  . GERD (gastroesophageal reflux disease)    about age of 59  . Nausea and vomiting in adult   . Stroke ([place])   . Urinary frequency   . Yeast vaginitis     Current Outpatient Medications:  .  acetaminophen-codeine (TYLENOL #3) 300-30 MG tablet, TAKE 1 TABLET EVERY 8 HOURS AS NEEDED FOR MODERATE PAIN (INS WILL ONLY PAY FOR 21), Disp: 21 tablet, Rfl: 1 .  alosetron (LOTRONEX) 0.5 MG tablet, Take 0.5 mg by mouth 2 (two) times daily., Disp: , Rfl: 6 .  aluminum chloride (DRYSOL) 20 % external solution, Apply topically at bedtime., Disp: 35 mL, Rfl: 5 .  amLODipine (NORVASC) 5 MG tablet, Take 1 tablet (5 mg total) by mouth daily., Disp: 30 tablet, Rfl: 5 .  aspirin EC 81 MG EC tablet, Take 1 tablet (81 mg total) by mouth daily., Disp: 30 tablet, Rfl: 0 .  atorvastatin (LIPITOR) 40 MG tablet, TAKE 1 TABLET (40 MG TOTAL) BY MOUTH DAILY AT 6 PM., Disp: 30  tablet, Rfl: 1 .  Blood Glucose Monitoring Suppl (ACCU-CHEK AVIVA PLUS) w/Device KIT, CHECK BLOOD SUGARS THREE TIMES A DAY.  DX:250.03, Disp: 1 kit, Rfl: 0 .  calcium citrate-vitamin D 500-400 MG-UNIT chewable tablet, Chew 1 tablet by mouth 2 (two) times daily., Disp: 180 tablet, Rfl: 1 .  Cholecalciferol (CVS VIT D 5000 HIGH-POTENCY) 5000 units capsule, Take 5,000 Units by mouth daily., Disp: , Rfl:  .  cyclobenzaprine (FLEXERIL) 10 MG tablet, TAKE 1 TABLET AT BEDTIME AS NEEDED FOR MUSCLE SPASM (Patient taking differently: Take 10 mg by mouth at bedtime as needed for muscle spasms. ), Disp: 30 tablet, Rfl: 5 .  DEXILANT 60 MG capsule, Take 60 mg by mouth daily., Disp: , Rfl: 6 .  diphenoxylate-atropine (LOMOTIL) 2.5-0.025 MG tablet, 1 or 2 pills twice daily as needed for loose stools/diarrhea.  Office visit needed, Disp: 60 tablet, Rfl: 0 .  DULoxetine (CYMBALTA) 60 MG capsule, TAKE 1 CAPSULE BY MOUTH EVERY DAY. Please make PCP appointment., Disp: 30 capsule, Rfl: 0 .  fluticasone (FLONASE) 50 MCG/ACT nasal spray, Place 2 sprays into both nostrils daily., Disp: 1 g, Rfl: 0 .  glucose blood (ACCU-CHEK AVIVA PLUS) test strip, USE TO CHECK BLOOD SUGAR BEFORE MEALS AND AT BEDTIME dx code E10.65, Disp: 125  each, Rfl: 6 .  hydrALAZINE (APRESOLINE) 50 MG tablet, TAKE ONE TABLET (50MG) BY MOUTH THREE TIMES DAILY, Disp: 90 tablet, Rfl: 11 .  hydrALAZINE (APRESOLINE) 50 MG tablet, Take 1 tablet (50 mg total) by mouth 3 (three) times daily., Disp: 90 tablet, Rfl: 5 .  insulin aspart (NOVOLOG FLEXPEN) 100 UNIT/ML FlexPen, Inject 10 Units into the skin 3 (three) times daily with meals., Disp: 15 mL, Rfl: 4 .  insulin lispro (HUMALOG KWIKPEN) 100 UNIT/ML KiwkPen, Inject 0.13 mLs (13 Units total) into the skin 3 (three) times daily., Disp: 15 mL, Rfl: 6 .  Insulin Pen Needle (TRUEPLUS PEN NEEDLES) 32G X 4 MM MISC, Use to inject insulin as directed., Disp: 100 each, Rfl: 11 .  LANTUS SOLOSTAR 100 UNIT/ML Solostar  Pen, INJECT 50 UNITS SUBCUTANEOUSLY EVERY DAY, Disp: 15 mL, Rfl: 3 .  oxyCODONE (OXY IR/ROXICODONE) 5 MG immediate release tablet, Take 5 mg by mouth every 6 (six) hours as needed (pain)., Disp: , Rfl:  .  promethazine (PHENERGAN) 12.5 MG tablet, Take 1 tablet (12.5 mg total) by mouth every 8 (eight) hours as needed for nausea or vomiting., Disp: 20 tablet, Rfl: 0 .  topiramate (TOPAMAX) 25 MG tablet, Take 1 tablet (25 mg total) by mouth 2 (two) times daily., Disp: 120 tablet, Rfl: 3 .  zolpidem (AMBIEN) 5 MG tablet, Take 1 tablet (5 mg total) by mouth at bedtime as needed for sleep., Disp: 30 tablet, Rfl: 2  Social History   Tobacco Use  Smoking Status Light Tobacco Smoker  . Packs/day: 0.25  . Years: 22.00  . Pack years: 5.50  . Types: Cigarettes  Smokeless Tobacco Never Used    Allergies  Allergen Reactions  . Lidocaine Itching  . Trazodone Swelling  . Latex Rash   Objective:  There were no vitals filed for this visit. There is no height or weight on file to calculate BMI. Constitutional Well developed. Well nourished.  Vascular Dorsalis pedis pulses palpable bilaterally. Posterior tibial pulses palpable bilaterally. Capillary refill normal to all digits.  No cyanosis or clubbing noted. Pedal hair growth normal.  Neurologic Normal speech. Oriented to person, place, and time. Protective sensation absent  Dermatologic Wound Location: Left Chopart amputation site and wound to the plantar foot Wound Base: Mixed Granular/Fibrotic Peri-wound: Calloused Exudate: Scant/small amount Serosanguinous exudate Wound Measurements: -See below  Orthopedic: No pain to palpation either foot.   Radiographs: 3 views of skeletally mature adult left foot: No cortical irregularity or destruction noted.  No concern for osteomyelitis noted. Assessment:   1. Foot pain, left   2. Diabetes mellitus type 2, uncontrolled, with complications ([place])   3. Diabetic ulcer of left heel associated with  diabetes mellitus due to underlying condition, with fat layer exposed ([place])    Plan:  Patient was evaluated and treated and all questions answered.  Ulcer left Chopart amputation site plantar foot wound -Debridement as below. -Dressed with Betadine wet-to-dry, DSD. -Patient has obtained new knee scooter and has been nonweightbearing to the left lower extremity. -Patient is a high risk of losing the leg if the wound worsens or the infection creeps up.  I discussed this with the patient that he is she is a high risk of losing the leg given the amputation that was already done previously  Patient states understanding. -I believe patient will benefit from negative pressure wound therapy.  Patient may also benefit from a trip to the operating room for aggressive debridement.  Procedure: Excisional Debridement of Wound  Tool: Sharp chisel blade/tissue nipper Rationale: Removal of non-viable soft tissue from the wound to promote healing.  Anesthesia: none Pre-Debridement Wound Measurements: 1.5 cm x 1.5 cm x 0.7 cm  Post-Debridement Wound Measurements: 1.7 cm x 1.7 cm x 0.7 cm  Type of Debridement: Sharp Excisional Tissue Removed: Non-viable soft tissue Blood loss: Minimal (<50cc) Depth of Debridement: Muscle Technique: Sharp excisional debridement to bleeding, viable wound base.  Wound Progress: The wound is getting slightly smaller. Dressing: Dry, sterile, compression dressing. Disposition: Patient tolerated procedure well. Patient to return in 1 week for follow-up.  No follow-ups on file.

## 2019-09-06 NOTE — Telephone Encounter (Signed)
-----   Message from Felipa Furnace, DPM sent at 09/06/2019  4:38 AM EDT ----- Regarding: [employer] wound VAC [facility] [place] [place],  This patient be able to qualify for [employer] wound VAC.  If so we will should be able to set up wound VAC for this patient.  The clinicals are in the chart.   Thanks Lennette Bihari

## 2019-09-07 DIAGNOSIS — Z79899 Other long term (current) drug therapy: Secondary | ICD-10-CM | POA: Diagnosis not present

## 2019-09-07 DIAGNOSIS — G546 Phantom limb syndrome with pain: Secondary | ICD-10-CM | POA: Diagnosis not present

## 2019-09-07 DIAGNOSIS — R32 Unspecified urinary incontinence: Secondary | ICD-10-CM | POA: Diagnosis not present

## 2019-09-07 DIAGNOSIS — R110 Nausea: Secondary | ICD-10-CM | POA: Diagnosis not present

## 2019-09-07 DIAGNOSIS — G894 Chronic pain syndrome: Secondary | ICD-10-CM | POA: Diagnosis not present

## 2019-09-08 ENCOUNTER — Telehealth: Payer: Self-pay | Admitting: Family Medicine

## 2019-09-08 ENCOUNTER — Other Ambulatory Visit: Payer: Self-pay | Admitting: Podiatry

## 2019-09-08 DIAGNOSIS — IMO0002 Reserved for concepts with insufficient information to code with codable children: Secondary | ICD-10-CM

## 2019-09-08 DIAGNOSIS — E1165 Type 2 diabetes mellitus with hyperglycemia: Secondary | ICD-10-CM

## 2019-09-08 NOTE — Telephone Encounter (Signed)
Ok to fax form

## 2019-09-08 NOTE — Telephone Encounter (Signed)
Hansel Feinstein calling from [employer] that makes that the wound vac that [facility] would like to prescribe for the Patient. [facility] is wanting a wound vac ordered for the paitent. However, the patient has medicaid does not allow podiatry to place order. Annitta Needs is calling to see if Dr. Chapman Fitch can sign the form. Please advise [phone]- [phone]  Form will be faxed.

## 2019-09-09 ENCOUNTER — Ambulatory Visit (INDEPENDENT_AMBULATORY_CARE_PROVIDER_SITE_OTHER): Payer: Medicaid Other | Admitting: Podiatry

## 2019-09-09 ENCOUNTER — Other Ambulatory Visit: Payer: Self-pay

## 2019-09-09 DIAGNOSIS — E1165 Type 2 diabetes mellitus with hyperglycemia: Secondary | ICD-10-CM

## 2019-09-09 DIAGNOSIS — L97422 Non-pressure chronic ulcer of left heel and midfoot with fat layer exposed: Secondary | ICD-10-CM | POA: Diagnosis not present

## 2019-09-09 DIAGNOSIS — IMO0002 Reserved for concepts with insufficient information to code with codable children: Secondary | ICD-10-CM

## 2019-09-09 DIAGNOSIS — E08621 Diabetes mellitus due to underlying condition with foot ulcer: Secondary | ICD-10-CM

## 2019-09-09 NOTE — Telephone Encounter (Signed)
I spoke with [facility] and she states she needs the 1st Wound vac form refaxed, and Medicaid will not allow podiatrist to sign for the wound vac, so she sent to pt's PCP and is waiting for the PCP to return fax. Faxed required [employer] forms and demographics to [place].

## 2019-09-13 ENCOUNTER — Encounter: Payer: Self-pay | Admitting: Podiatry

## 2019-09-13 ENCOUNTER — Other Ambulatory Visit: Payer: Self-pay | Admitting: Family Medicine

## 2019-09-13 NOTE — Progress Notes (Signed)
Subjective:  Patient ID: [name], female    DOB: [birth year],  MRN: [id number]  Chief Complaint  Patient presents with  . Foot Ulcer    1 week follow up right    54 y.o. female presents for wound care.  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  Patient states that it is looking somewhat better.  Patient has been doing Betadine wet-to-dry dressing changes patient has obtained the scooter and will start using it now.  Patient states is mildly painful to pressure.  Pain scale is 4 out of 10.  She denies any other acute complaints.   Review of Systems: Negative except as noted in the HPI. Denies N/V/F/Ch.  Past Medical History:  Diagnosis Date  . Anxiety 2002  . Chest tightness   . Depression 2001  . Diabetes type 1, uncontrolled ([place])    at age 68  . Diabetic neuropathy ([place])   . Essential hypertension 2015  . GERD (gastroesophageal reflux disease)    about age of 27  . Nausea and vomiting in adult   . Stroke ([place])   . Urinary frequency   . Yeast vaginitis     Current Outpatient Medications:  .  acetaminophen-codeine (TYLENOL #3) 300-30 MG tablet, TAKE 1 TABLET EVERY 8 HOURS AS NEEDED FOR MODERATE PAIN (INS WILL ONLY PAY FOR 21), Disp: 21 tablet, Rfl: 1 .  alosetron (LOTRONEX) 0.5 MG tablet, Take 0.5 mg by mouth 2 (two) times daily., Disp: , Rfl: 6 .  aluminum chloride (DRYSOL) 20 % external solution, Apply topically at bedtime., Disp: 35 mL, Rfl: 5 .  amLODipine (NORVASC) 5 MG tablet, Take 1 tablet (5 mg total) by mouth daily., Disp: 30 tablet, Rfl: 5 .  aspirin EC 81 MG EC tablet, Take 1 tablet (81 mg total) by mouth daily., Disp: 30 tablet, Rfl: 0 .  atorvastatin (LIPITOR) 40 MG tablet, TAKE 1 TABLET (40 MG TOTAL) BY MOUTH DAILY AT 6 PM., Disp: 30 tablet, Rfl: 1 .  BELBUCA 150 MCG FILM, Take 1 strip by mouth 2 (two) times daily., Disp: , Rfl:  .  Blood Glucose Monitoring Suppl (ACCU-CHEK AVIVA PLUS) w/Device KIT,  CHECK BLOOD SUGARS THREE TIMES A DAY.  DX:250.03, Disp: 1 kit, Rfl: 0 .  calcium citrate-vitamin D 500-400 MG-UNIT chewable tablet, Chew 1 tablet by mouth 2 (two) times daily., Disp: 180 tablet, Rfl: 1 .  carvedilol (COREG) 6.25 MG tablet, Take 6.25 mg by mouth 2 (two) times daily., Disp: , Rfl:  .  Cholecalciferol (CVS VIT D 5000 HIGH-POTENCY) 5000 units capsule, Take 5,000 Units by mouth daily., Disp: , Rfl:  .  cyclobenzaprine (FLEXERIL) 10 MG tablet, TAKE 1 TABLET AT BEDTIME AS NEEDED FOR MUSCLE SPASM (Patient taking differently: Take 10 mg by mouth at bedtime as needed for muscle spasms. ), Disp: 30 tablet, Rfl: 5 .  DEXILANT 60 MG capsule, Take 60 mg by mouth daily., Disp: , Rfl: 6 .  diphenoxylate-atropine (LOMOTIL) 2.5-0.025 MG tablet, 1 or 2 pills twice daily as needed for loose stools/diarrhea.  Office visit needed, Disp: 60 tablet, Rfl: 0 .  DULoxetine (CYMBALTA) 60 MG capsule, TAKE 1 CAPSULE BY MOUTH EVERY DAY. Please make PCP appointment., Disp: 30 capsule, Rfl: 0 .  fluticasone (FLONASE) 50 MCG/ACT nasal spray, Place 2 sprays into both nostrils daily., Disp: 1 g, Rfl: 0 .  glucose blood (ACCU-CHEK AVIVA PLUS) test strip, USE TO CHECK BLOOD SUGAR BEFORE MEALS AND AT BEDTIME  dx code E10.65, Disp: 125 each, Rfl: 6 .  hydrALAZINE (APRESOLINE) 50 MG tablet, TAKE ONE TABLET (50MG) BY MOUTH THREE TIMES DAILY, Disp: 90 tablet, Rfl: 11 .  hydrALAZINE (APRESOLINE) 50 MG tablet, Take 1 tablet (50 mg total) by mouth 3 (three) times daily., Disp: 90 tablet, Rfl: 5 .  insulin aspart (NOVOLOG FLEXPEN) 100 UNIT/ML FlexPen, Inject 10 Units into the skin 3 (three) times daily with meals., Disp: 15 mL, Rfl: 4 .  insulin lispro (HUMALOG KWIKPEN) 100 UNIT/ML KiwkPen, Inject 0.13 mLs (13 Units total) into the skin 3 (three) times daily., Disp: 15 mL, Rfl: 6 .  Insulin Pen Needle (TRUEPLUS PEN NEEDLES) 32G X 4 MM MISC, Use to inject insulin as directed., Disp: 100 each, Rfl: 11 .  LANTUS SOLOSTAR 100 UNIT/ML  Solostar Pen, INJECT 50 UNITS SUBCUTANEOUSLY EVERY DAY, Disp: 15 mL, Rfl: 3 .  oxyCODONE (OXY IR/ROXICODONE) 5 MG immediate release tablet, Take 5 mg by mouth every 6 (six) hours as needed (pain)., Disp: , Rfl:  .  promethazine (PHENERGAN) 12.5 MG tablet, Take 1 tablet (12.5 mg total) by mouth every 8 (eight) hours as needed for nausea or vomiting., Disp: 20 tablet, Rfl: 0 .  topiramate (TOPAMAX) 25 MG tablet, Take 1 tablet (25 mg total) by mouth 2 (two) times daily., Disp: 120 tablet, Rfl: 3 .  zolpidem (AMBIEN) 5 MG tablet, Take 1 tablet (5 mg total) by mouth at bedtime as needed for sleep., Disp: 30 tablet, Rfl: 2  Social History   Tobacco Use  Smoking Status Light Tobacco Smoker  . Packs/day: 0.25  . Years: 22.00  . Pack years: 5.50  . Types: Cigarettes  Smokeless Tobacco Never Used    Allergies  Allergen Reactions  . Lidocaine Itching  . Trazodone Swelling  . Latex Rash   Objective:  There were no vitals filed for this visit. There is no height or weight on file to calculate BMI. Constitutional Well developed. Well nourished.  Vascular Dorsalis pedis pulses palpable bilaterally. Posterior tibial pulses palpable bilaterally. Capillary refill normal to all digits.  No cyanosis or clubbing noted. Pedal hair growth normal.  Neurologic Normal speech. Oriented to person, place, and time. Protective sensation absent  Dermatologic Wound Location: Left Chopart amputation site and wound to the plantar foot Wound Base: Mixed Granular/Fibrotic Peri-wound: Calloused Exudate: Scant/small amount Serosanguinous exudate Wound Measurements: -See below  Orthopedic: No pain to palpation either foot.   Radiographs: 3 views of skeletally mature adult left foot: No cortical irregularity or destruction noted.  No concern for osteomyelitis noted. Assessment:   1. Diabetes mellitus type 2, uncontrolled, with complications ([place])   2. 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  Ulcer left Chopart amputation site plantar foot wound -Debridement as below. -Dressed with Betadine wet-to-dry, DSD. -Patient has obtained new knee scooter and has been nonweightbearing to the left lower extremity. -Patient is a high risk of losing the leg if the wound worsens or the infection creeps up.  I discussed this with the patient that he is she is a high risk of losing the leg given the amputation that was already done previously  Patient states understanding. -I believe patient will benefit from negative pressure wound therapy.  Patient is working on Ship broker from primary care physician to get the wound VAC approved.  Patient is a high  risk of limb loss if the wound VAC is not approved. -My office will reach out to her primary care physician to have the wound VAC paperwork signed therefore the insurance will approve the wound VAC.  Procedure: Excisional Debridement of Wound Tool: Sharp chisel blade/tissue nipper Rationale: Removal of non-viable soft tissue from the wound to promote healing.  Anesthesia: none Pre-Debridement Wound Measurements: 1.3 cm x 1.3 cm x 0.6 cm Post-Debridement Wound Measurements: 1.5 cm x 1.4 cm x 0.7 cm Type of Debridement: Sharp Excisional Tissue Removed: Non-viable soft tissue Blood loss: Minimal (<50cc) Depth of Debridement: Muscle Technique: Sharp excisional debridement to bleeding, viable wound base.  Wound Progress: The wound is getting slightly smaller. Dressing: Dry, sterile, compression dressing. Disposition: Patient tolerated procedure well. Patient to return in 1 week for follow-up.  No follow-ups on file.

## 2019-09-13 NOTE — Telephone Encounter (Signed)
Office was called tore fax paperwork for Dr.Newlin to fill out

## 2019-09-13 NOTE — Telephone Encounter (Signed)
Dr. Ena Dawley  office is calling in to follow up on form completion, please advise  [name]  CB: [phone]  Fax: [phone]      Please advise.

## 2019-09-19 ENCOUNTER — Telehealth: Payer: Self-pay

## 2019-09-19 NOTE — Telephone Encounter (Signed)
Please reach out to patient or office to obtain paperwork. We dont have any paperwork for this patient.

## 2019-09-19 NOTE — Telephone Encounter (Signed)
[employer] called to check he status of the medicaid form?   [name] called to check the status? cb - [phone] Fax number  [phone]

## 2019-09-19 NOTE — Telephone Encounter (Signed)
Signed CMN for wound VAC faxed to [facility]

## 2019-09-21 ENCOUNTER — Ambulatory Visit (INDEPENDENT_AMBULATORY_CARE_PROVIDER_SITE_OTHER): Payer: Medicaid Other | Admitting: Podiatry

## 2019-09-21 ENCOUNTER — Other Ambulatory Visit: Payer: Self-pay

## 2019-09-21 DIAGNOSIS — IMO0002 Reserved for concepts with insufficient information to code with codable children: Secondary | ICD-10-CM

## 2019-09-21 DIAGNOSIS — E1165 Type 2 diabetes mellitus with hyperglycemia: Secondary | ICD-10-CM

## 2019-09-21 DIAGNOSIS — E08621 Diabetes mellitus due to underlying condition with foot ulcer: Secondary | ICD-10-CM

## 2019-09-21 DIAGNOSIS — L97422 Non-pressure chronic ulcer of left heel and midfoot with fat layer exposed: Secondary | ICD-10-CM

## 2019-09-21 DIAGNOSIS — E118 Type 2 diabetes mellitus with unspecified complications: Secondary | ICD-10-CM

## 2019-09-22 NOTE — Progress Notes (Signed)
Subjective:  Patient ID: [name], female    DOB: [birth year],  MRN: [id number]  Chief Complaint  Patient presents with  . Wound Check    pt is here for left foot pain wound check    54 y.o. female presents for wound care.  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  Patient states that it is looking somewhat better.  Patient has been doing Betadine wet-to-dry dressing changes patient has obtained the scooter and will start using it now.  Patient states is mildly painful to pressure.  Pain scale is 4 out of 10.  She denies any other acute complaints.   Review of Systems: Negative except as noted in the HPI. Denies N/V/F/Ch.  Past Medical History:  Diagnosis Date  . Anxiety 2002  . Chest tightness   . Depression 2001  . Diabetes type 1, uncontrolled ([place])    at age 55  . Diabetic neuropathy ([place])   . Essential hypertension 2015  . GERD (gastroesophageal reflux disease)    about age of 18  . Nausea and vomiting in adult   . Stroke ([place])   . Urinary frequency   . Yeast vaginitis     Current Outpatient Medications:  .  acetaminophen-codeine (TYLENOL #3) 300-30 MG tablet, TAKE 1 TABLET EVERY 8 HOURS AS NEEDED FOR MODERATE PAIN (INS WILL ONLY PAY FOR 21), Disp: 21 tablet, Rfl: 1 .  alosetron (LOTRONEX) 0.5 MG tablet, Take 0.5 mg by mouth 2 (two) times daily., Disp: , Rfl: 6 .  aluminum chloride (DRYSOL) 20 % external solution, Apply topically at bedtime., Disp: 35 mL, Rfl: 5 .  amLODipine (NORVASC) 5 MG tablet, Take 1 tablet (5 mg total) by mouth daily., Disp: 30 tablet, Rfl: 5 .  aspirin EC 81 MG EC tablet, Take 1 tablet (81 mg total) by mouth daily., Disp: 30 tablet, Rfl: 0 .  atorvastatin (LIPITOR) 40 MG tablet, TAKE 1 TABLET (40 MG TOTAL) BY MOUTH DAILY AT 6 PM., Disp: 30 tablet, Rfl: 1 .  BELBUCA 150 MCG FILM, Take 1 strip by mouth 2 (two) times daily., Disp: , Rfl:  .  Blood Glucose Monitoring Suppl (ACCU-CHEK AVIVA  PLUS) w/Device KIT, CHECK BLOOD SUGARS THREE TIMES A DAY.  DX:250.03, Disp: 1 kit, Rfl: 0 .  calcium citrate-vitamin D 500-400 MG-UNIT chewable tablet, Chew 1 tablet by mouth 2 (two) times daily., Disp: 180 tablet, Rfl: 1 .  carvedilol (COREG) 6.25 MG tablet, Take 6.25 mg by mouth 2 (two) times daily., Disp: , Rfl:  .  Cholecalciferol (CVS VIT D 5000 HIGH-POTENCY) 5000 units capsule, Take 5,000 Units by mouth daily., Disp: , Rfl:  .  cyclobenzaprine (FLEXERIL) 10 MG tablet, TAKE 1 TABLET AT BEDTIME AS NEEDED FOR MUSCLE SPASM (Patient taking differently: Take 10 mg by mouth at bedtime as needed for muscle spasms. ), Disp: 30 tablet, Rfl: 5 .  DEXILANT 60 MG capsule, Take 60 mg by mouth daily., Disp: , Rfl: 6 .  diphenoxylate-atropine (LOMOTIL) 2.5-0.025 MG tablet, 1 or 2 pills twice daily as needed for loose stools/diarrhea.  Office visit needed, Disp: 60 tablet, Rfl: 0 .  DULoxetine (CYMBALTA) 60 MG capsule, TAKE 1 CAPSULE BY MOUTH EVERY DAY. Please make PCP appointment., Disp: 30 capsule, Rfl: 0 .  fluticasone (FLONASE) 50 MCG/ACT nasal spray, Place 2 sprays into both nostrils daily., Disp: 1 g, Rfl: 0 .  glucose blood (ACCU-CHEK AVIVA PLUS) test strip, USE TO CHECK BLOOD SUGAR BEFORE  MEALS AND AT BEDTIME dx code E10.65, Disp: 125 each, Rfl: 6 .  hydrALAZINE (APRESOLINE) 50 MG tablet, TAKE ONE TABLET (50MG) BY MOUTH THREE TIMES DAILY, Disp: 90 tablet, Rfl: 11 .  hydrALAZINE (APRESOLINE) 50 MG tablet, Take 1 tablet (50 mg total) by mouth 3 (three) times daily., Disp: 90 tablet, Rfl: 5 .  hydrALAZINE (APRESOLINE) 50 MG tablet, TAKE 1 TABLET BY MOUTH THREE TIMES A DAY, Disp: 30 tablet, Rfl: 0 .  insulin aspart (NOVOLOG FLEXPEN) 100 UNIT/ML FlexPen, Inject 10 Units into the skin 3 (three) times daily with meals., Disp: 15 mL, Rfl: 4 .  insulin lispro (HUMALOG KWIKPEN) 100 UNIT/ML KiwkPen, Inject 0.13 mLs (13 Units total) into the skin 3 (three) times daily., Disp: 15 mL, Rfl: 6 .  Insulin Pen Needle  (TRUEPLUS PEN NEEDLES) 32G X 4 MM MISC, Use to inject insulin as directed., Disp: 100 each, Rfl: 11 .  LANTUS SOLOSTAR 100 UNIT/ML Solostar Pen, INJECT 50 UNITS SUBCUTANEOUSLY EVERY DAY, Disp: 15 mL, Rfl: 3 .  oxyCODONE (OXY IR/ROXICODONE) 5 MG immediate release tablet, Take 5 mg by mouth every 6 (six) hours as needed (pain)., Disp: , Rfl:  .  promethazine (PHENERGAN) 12.5 MG tablet, Take 1 tablet (12.5 mg total) by mouth every 8 (eight) hours as needed for nausea or vomiting., Disp: 20 tablet, Rfl: 0 .  topiramate (TOPAMAX) 25 MG tablet, Take 1 tablet (25 mg total) by mouth 2 (two) times daily., Disp: 120 tablet, Rfl: 3 .  zolpidem (AMBIEN) 5 MG tablet, Take 1 tablet (5 mg total) by mouth at bedtime as needed for sleep., Disp: 30 tablet, Rfl: 2  Social History   Tobacco Use  Smoking Status Light Tobacco Smoker  . Packs/day: 0.25  . Years: 22.00  . Pack years: 5.50  . Types: Cigarettes  Smokeless Tobacco Never Used    Allergies  Allergen Reactions  . Lidocaine Itching  . Trazodone Swelling  . Latex Rash   Objective:  There were no vitals filed for this visit. There is no height or weight on file to calculate BMI. Constitutional Well developed. Well nourished.  Vascular Dorsalis pedis pulses palpable bilaterally. Posterior tibial pulses palpable bilaterally. Capillary refill normal to all digits.  No cyanosis or clubbing noted. Pedal hair growth normal.  Neurologic Normal speech. Oriented to person, place, and time. Protective sensation absent  Dermatologic Wound Location: Left Chopart amputation site and wound to the plantar foot Wound Base: Mixed Granular/Fibrotic Peri-wound: Calloused Exudate: Scant/small amount Serosanguinous exudate Wound Measurements: -See below  Orthopedic: No pain to palpation either foot.   Radiographs: 3 views of skeletally mature adult left foot: No cortical irregularity or destruction noted.  No concern for osteomyelitis noted. Assessment:    1. Diabetes mellitus type 2, uncontrolled, with complications ([place])   2. Diabetic ulcer of left heel associated with diabetes mellitus due to underlying condition, with fat layer exposed ([place])    Plan:  Patient was evaluated and treated and all questions answered.  Ulcer left Chopart amputation site plantar foot wound -Debridement as below. -Dressed with Betadine wet-to-dry, DSD. -Patient has obtained new knee scooter and has been nonweightbearing to the left lower extremity. -Patient is a high risk of losing the leg if the wound worsens or the infection creeps up.  I discussed this with the patient that he is she is a high risk of losing the leg given the amputation that was already done previously  Patient states understanding. -I believe patient will benefit from negative  pressure wound therapy.  Patient is working on Ship broker from primary care physician to get the wound VAC approved.  Patient is a high risk of limb loss if the wound VAC is not approved. -My office will reach out to her primary care physician to have the wound VAC paperwork signed therefore the insurance will approve the wound VAC.  Procedure: Excisional Debridement of Wound Tool: Sharp chisel blade/tissue nipper Rationale: Removal of non-viable soft tissue from the wound to promote healing.  Anesthesia: none Pre-Debridement Wound Measurements: 1.1 cm x 1.1 cm x 0.6 cm Post-Debridement Wound Measurements: 1.2 cm x 1.2 cm x 0.7 cm Type of Debridement: Sharp Excisional Tissue Removed: Non-viable soft tissue Blood loss: Minimal (<50cc) Depth of Debridement: Muscle Technique: Sharp excisional debridement to bleeding, viable wound base.  Wound Progress: The wound is getting slightly smaller. Dressing: Dry, sterile, compression dressing. Disposition: Patient tolerated procedure well. Patient to return in 1 week for follow-up.  No follow-ups on file.

## 2019-09-23 ENCOUNTER — Telehealth: Payer: Self-pay | Admitting: *Deleted

## 2019-09-23 NOTE — Telephone Encounter (Signed)
-----   Message from Felipa Furnace, DPM sent at 09/22/2019  1:11 PM EDT ----- Regarding: Confirming wound VAC Hi Amal Saiki,  I just wanted to confirm that the wound VAC for this patient that require primary care physician to set it up was being sent to [facility] who is a PA and may need to get an MD to sign on it.  They work at [employer].  She states that this is her primary care physician.  It may not reflect on the chart.  Let me know if this is the person that you sent to.  Thank you

## 2019-09-23 NOTE — Telephone Encounter (Signed)
Emailed the PA name and [facility] to [place].

## 2019-09-26 ENCOUNTER — Telehealth: Payer: Self-pay | Admitting: *Deleted

## 2019-09-26 NOTE — Telephone Encounter (Signed)
Faxed completed [employer] form to [facility] and sent copy to be scanned.

## 2019-09-26 NOTE — Telephone Encounter (Signed)
I believe it was like one month before I started seeing her

## 2019-09-26 NOTE — Telephone Encounter (Signed)
[employer] - S. Rosana Hoes states [employer]  Prior Authorization Request form needs to be completed by Dr. Posey Pronto.

## 2019-09-29 DIAGNOSIS — T8189XA Other complications of procedures, not elsewhere classified, initial encounter: Secondary | ICD-10-CM | POA: Diagnosis not present

## 2019-10-02 DIAGNOSIS — R32 Unspecified urinary incontinence: Secondary | ICD-10-CM | POA: Diagnosis not present

## 2019-10-07 ENCOUNTER — Other Ambulatory Visit: Payer: Self-pay

## 2019-10-07 ENCOUNTER — Ambulatory Visit (INDEPENDENT_AMBULATORY_CARE_PROVIDER_SITE_OTHER): Payer: Medicaid Other | Admitting: Podiatry

## 2019-10-07 DIAGNOSIS — Z79899 Other long term (current) drug therapy: Secondary | ICD-10-CM | POA: Diagnosis not present

## 2019-10-07 DIAGNOSIS — G894 Chronic pain syndrome: Secondary | ICD-10-CM | POA: Diagnosis not present

## 2019-10-07 DIAGNOSIS — E1165 Type 2 diabetes mellitus with hyperglycemia: Secondary | ICD-10-CM | POA: Diagnosis not present

## 2019-10-07 DIAGNOSIS — E1122 Type 2 diabetes mellitus with diabetic chronic kidney disease: Secondary | ICD-10-CM

## 2019-10-07 DIAGNOSIS — E118 Type 2 diabetes mellitus with unspecified complications: Secondary | ICD-10-CM

## 2019-10-07 DIAGNOSIS — L97422 Non-pressure chronic ulcer of left heel and midfoot with fat layer exposed: Secondary | ICD-10-CM | POA: Diagnosis not present

## 2019-10-07 DIAGNOSIS — L97529 Non-pressure chronic ulcer of other part of left foot with unspecified severity: Secondary | ICD-10-CM | POA: Diagnosis not present

## 2019-10-07 DIAGNOSIS — R110 Nausea: Secondary | ICD-10-CM | POA: Diagnosis not present

## 2019-10-07 DIAGNOSIS — G546 Phantom limb syndrome with pain: Secondary | ICD-10-CM | POA: Diagnosis not present

## 2019-10-07 DIAGNOSIS — E08621 Diabetes mellitus due to underlying condition with foot ulcer: Secondary | ICD-10-CM

## 2019-10-07 DIAGNOSIS — N183 Chronic kidney disease, stage 3 unspecified: Secondary | ICD-10-CM

## 2019-10-07 DIAGNOSIS — IMO0002 Reserved for concepts with insufficient information to code with codable children: Secondary | ICD-10-CM

## 2019-10-09 NOTE — Progress Notes (Signed)
Subjective:  Patient ID: [name], female    DOB: 09-08-1965,  MRN: [id number]  Chief Complaint  Patient presents with  . Wound Check    pt is here for a wound check of the right.     54 y.o. female presents for wound care.  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  Patient states that it is looking somewhat better.  Patient has been doing Betadine wet-to-dry dressing changes patient has obtained the scooter and will start using it now.  Patient's wound VAC was approved.  She has brought the wound VAC supplies with her.  We will start doing wound VAC changes twice a week in clinic.  As she is not able to get nursing.   Review of Systems: Negative except as noted in the HPI. Denies N/V/F/Ch.  Past Medical History:  Diagnosis Date  . Anxiety 2002  . Chest tightness   . Depression 2001  . Diabetes type 1, uncontrolled ([place])    at age 19  . Diabetic neuropathy ([place])   . Essential hypertension 2015  . GERD (gastroesophageal reflux disease)    about age of 4  . Nausea and vomiting in adult   . Stroke ([place])   . Urinary frequency   . Yeast vaginitis     Current Outpatient Medications:  .  acetaminophen-codeine (TYLENOL #3) 300-30 MG tablet, TAKE 1 TABLET EVERY 8 HOURS AS NEEDED FOR MODERATE PAIN (INS WILL ONLY PAY FOR 21), Disp: 21 tablet, Rfl: 1 .  alosetron (LOTRONEX) 0.5 MG tablet, Take 0.5 mg by mouth 2 (two) times daily., Disp: , Rfl: 6 .  aluminum chloride (DRYSOL) 20 % external solution, Apply topically at bedtime., Disp: 35 mL, Rfl: 5 .  amLODipine (NORVASC) 5 MG tablet, Take 1 tablet (5 mg total) by mouth daily., Disp: 30 tablet, Rfl: 5 .  aspirin EC 81 MG EC tablet, Take 1 tablet (81 mg total) by mouth daily., Disp: 30 tablet, Rfl: 0 .  atorvastatin (LIPITOR) 40 MG tablet, TAKE 1 TABLET (40 MG TOTAL) BY MOUTH DAILY AT 6 PM., Disp: 30 tablet, Rfl: 1 .  BELBUCA 150 MCG FILM, Take 1 strip by mouth 2 (two) times  daily., Disp: , Rfl:  .  Blood Glucose Monitoring Suppl (ACCU-CHEK AVIVA PLUS) w/Device KIT, CHECK BLOOD SUGARS THREE TIMES A DAY.  DX:250.03, Disp: 1 kit, Rfl: 0 .  calcium citrate-vitamin D 500-400 MG-UNIT chewable tablet, Chew 1 tablet by mouth 2 (two) times daily., Disp: 180 tablet, Rfl: 1 .  carvedilol (COREG) 6.25 MG tablet, Take 6.25 mg by mouth 2 (two) times daily., Disp: , Rfl:  .  Cholecalciferol (CVS VIT D 5000 HIGH-POTENCY) 5000 units capsule, Take 5,000 Units by mouth daily., Disp: , Rfl:  .  cyclobenzaprine (FLEXERIL) 10 MG tablet, TAKE 1 TABLET AT BEDTIME AS NEEDED FOR MUSCLE SPASM (Patient taking differently: Take 10 mg by mouth at bedtime as needed for muscle spasms. ), Disp: 30 tablet, Rfl: 5 .  DEXILANT 60 MG capsule, Take 60 mg by mouth daily., Disp: , Rfl: 6 .  diphenoxylate-atropine (LOMOTIL) 2.5-0.025 MG tablet, 1 or 2 pills twice daily as needed for loose stools/diarrhea.  Office visit needed, Disp: 60 tablet, Rfl: 0 .  DULoxetine (CYMBALTA) 60 MG capsule, TAKE 1 CAPSULE BY MOUTH EVERY DAY. Please make PCP appointment., Disp: 30 capsule, Rfl: 0 .  fluticasone (FLONASE) 50 MCG/ACT nasal spray, Place 2 sprays into both nostrils daily., Disp: 1 g,  Rfl: 0 .  glucose blood (ACCU-CHEK AVIVA PLUS) test strip, USE TO CHECK BLOOD SUGAR BEFORE MEALS AND AT BEDTIME dx code E10.65, Disp: 125 each, Rfl: 6 .  hydrALAZINE (APRESOLINE) 50 MG tablet, TAKE ONE TABLET (50MG) BY MOUTH THREE TIMES DAILY, Disp: 90 tablet, Rfl: 11 .  hydrALAZINE (APRESOLINE) 50 MG tablet, Take 1 tablet (50 mg total) by mouth 3 (three) times daily., Disp: 90 tablet, Rfl: 5 .  hydrALAZINE (APRESOLINE) 50 MG tablet, TAKE 1 TABLET BY MOUTH THREE TIMES A DAY, Disp: 30 tablet, Rfl: 0 .  insulin aspart (NOVOLOG FLEXPEN) 100 UNIT/ML FlexPen, Inject 10 Units into the skin 3 (three) times daily with meals., Disp: 15 mL, Rfl: 4 .  insulin lispro (HUMALOG KWIKPEN) 100 UNIT/ML KiwkPen, Inject 0.13 mLs (13 Units total) into the  skin 3 (three) times daily., Disp: 15 mL, Rfl: 6 .  Insulin Pen Needle (TRUEPLUS PEN NEEDLES) 32G X 4 MM MISC, Use to inject insulin as directed., Disp: 100 each, Rfl: 11 .  LANTUS SOLOSTAR 100 UNIT/ML Solostar Pen, INJECT 50 UNITS SUBCUTANEOUSLY EVERY DAY, Disp: 15 mL, Rfl: 3 .  oxyCODONE (OXY IR/ROXICODONE) 5 MG immediate release tablet, Take 5 mg by mouth every 6 (six) hours as needed (pain)., Disp: , Rfl:  .  promethazine (PHENERGAN) 12.5 MG tablet, Take 1 tablet (12.5 mg total) by mouth every 8 (eight) hours as needed for nausea or vomiting., Disp: 20 tablet, Rfl: 0 .  topiramate (TOPAMAX) 25 MG tablet, Take 1 tablet (25 mg total) by mouth 2 (two) times daily., Disp: 120 tablet, Rfl: 3 .  zolpidem (AMBIEN) 5 MG tablet, Take 1 tablet (5 mg total) by mouth at bedtime as needed for sleep., Disp: 30 tablet, Rfl: 2  Social History   Tobacco Use  Smoking Status Light Tobacco Smoker  . Packs/day: 0.25  . Years: 22.00  . Pack years: 5.50  . Types: Cigarettes  Smokeless Tobacco Never Used    Allergies  Allergen Reactions  . Lidocaine Itching  . Trazodone Swelling  . Latex Rash   Objective:  There were no vitals filed for this visit. There is no height or weight on file to calculate BMI. Constitutional Well developed. Well nourished.  Vascular Dorsalis pedis pulses palpable bilaterally. Posterior tibial pulses palpable bilaterally. Capillary refill normal to all digits.  No cyanosis or clubbing noted. Pedal hair growth normal.  Neurologic Normal speech. Oriented to person, place, and time. Protective sensation absent  Dermatologic Wound Location: Left Chopart amputation site and wound to the plantar foot Wound Base: Mixed Granular/Fibrotic Peri-wound: Calloused Exudate: Scant/small amount Serosanguinous exudate Wound Measurements: -See below  Orthopedic: No pain to palpation either foot.   Radiographs: 3 views of skeletally mature adult left foot: No cortical irregularity or  destruction noted.  No concern for osteomyelitis noted. Assessment:   1. Diabetes mellitus type 2, uncontrolled, with complications ([place])   2. 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  Ulcer left Chopart amputation site plantar foot wound -Debridement as below. -Dressed with Betadine wet-to-dry, DSD. -Patient has obtained new knee scooter and has been nonweightbearing to the left lower extremity. -Patient is a high risk of losing the leg if the wound worsens or the infection creeps up.  I discussed this with the patient that he is she is a high risk of losing the leg given the  amputation that was already done previously  Patient states understanding. -Patient has obtained the wound VAC.  Wound VAC was applied in standard technique in office visit today.  We will plan on doing twice a week office visits for wound VAC changes.  Procedure: Excisional Debridement of Wound Tool: Sharp chisel blade/tissue nipper Rationale: Removal of non-viable soft tissue from the wound to promote healing.  Anesthesia: none Pre-Debridement Wound Measurements: 1.1 cm x 1.1 cm x 0.6 cm Post-Debridement Wound Measurements: 1.2 cm x 1.2 cm x 0.7 cm Type of Debridement: Sharp Excisional Tissue Removed: Non-viable soft tissue Blood loss: Minimal (<50cc) Depth of Debridement: Muscle Technique: Sharp excisional debridement to bleeding, viable wound base.  Wound Progress: The wound is getting slightly smaller. Dressing: Dry, sterile, compression dressing. Disposition: Patient tolerated procedure well. Patient to return in 1 week for follow-up.  Return see in Dr. Posey Pronto in [place] next Te[place]day.

## 2019-10-11 ENCOUNTER — Ambulatory Visit: Payer: Medicaid Other | Admitting: Podiatry

## 2019-10-11 ENCOUNTER — Other Ambulatory Visit: Payer: Self-pay

## 2019-10-11 DIAGNOSIS — E1165 Type 2 diabetes mellitus with hyperglycemia: Secondary | ICD-10-CM

## 2019-10-11 DIAGNOSIS — IMO0002 Reserved for concepts with insufficient information to code with codable children: Secondary | ICD-10-CM

## 2019-10-11 DIAGNOSIS — E08621 Diabetes mellitus due to underlying condition with foot ulcer: Secondary | ICD-10-CM | POA: Diagnosis not present

## 2019-10-11 DIAGNOSIS — L97422 Non-pressure chronic ulcer of left heel and midfoot with fat layer exposed: Secondary | ICD-10-CM | POA: Diagnosis not present

## 2019-10-11 DIAGNOSIS — E118 Type 2 diabetes mellitus with unspecified complications: Secondary | ICD-10-CM

## 2019-10-13 ENCOUNTER — Encounter: Payer: Self-pay | Admitting: Podiatry

## 2019-10-13 NOTE — Progress Notes (Signed)
Subjective:  Patient ID: [name], female    DOB: [birth year],  MRN: [id number]  No chief complaint on file.   54 y.o. female presents for wound care.  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  Patient has been applying the [facility] however there is maceration present around it as expected.  She denies any other acute complaints.  We will do Betadine dressing changes to address the maceration and go back to [facility] during next clinical visit.     Review of Systems: Negative except as noted in the HPI. Denies N/V/F/Ch.  Past Medical History:  Diagnosis Date  . Anxiety 2002  . Chest tightness   . Depression 2001  . Diabetes type 1, uncontrolled ([place])    at age 56  . Diabetic neuropathy ([place])   . Essential hypertension 2015  . GERD (gastroesophageal reflux disease)    about age of 41  . Nausea and vomiting in adult   . Stroke ([place])   . Urinary frequency   . Yeast vaginitis     Current Outpatient Medications:  .  acetaminophen-codeine (TYLENOL #3) 300-30 MG tablet, TAKE 1 TABLET EVERY 8 HOURS AS NEEDED FOR MODERATE PAIN (INS WILL ONLY PAY FOR 21), Disp: 21 tablet, Rfl: 1 .  alosetron (LOTRONEX) 0.5 MG tablet, Take 0.5 mg by mouth 2 (two) times daily., Disp: , Rfl: 6 .  aluminum chloride (DRYSOL) 20 % external solution, Apply topically at bedtime., Disp: 35 mL, Rfl: 5 .  amLODipine (NORVASC) 5 MG tablet, Take 1 tablet (5 mg total) by mouth daily., Disp: 30 tablet, Rfl: 5 .  aspirin EC 81 MG EC tablet, Take 1 tablet (81 mg total) by mouth daily., Disp: 30 tablet, Rfl: 0 .  atorvastatin (LIPITOR) 40 MG tablet, TAKE 1 TABLET (40 MG TOTAL) BY MOUTH DAILY AT 6 PM., Disp: 30 tablet, Rfl: 1 .  BELBUCA 150 MCG FILM, Take 1 strip by mouth 2 (two) times daily., Disp: , Rfl:  .  Blood Glucose Monitoring Suppl (ACCU-CHEK AVIVA PLUS) w/Device KIT, CHECK BLOOD SUGARS THREE TIMES A DAY.  DX:250.03, Disp: 1 kit, Rfl: 0 .  calcium citrate-vitamin  D 500-400 MG-UNIT chewable tablet, Chew 1 tablet by mouth 2 (two) times daily., Disp: 180 tablet, Rfl: 1 .  carvedilol (COREG) 6.25 MG tablet, Take 6.25 mg by mouth 2 (two) times daily., Disp: , Rfl:  .  Cholecalciferol (CVS VIT D 5000 HIGH-POTENCY) 5000 units capsule, Take 5,000 Units by mouth daily., Disp: , Rfl:  .  cyclobenzaprine (FLEXERIL) 10 MG tablet, TAKE 1 TABLET AT BEDTIME AS NEEDED FOR MUSCLE SPASM (Patient taking differently: Take 10 mg by mouth at bedtime as needed for muscle spasms. ), Disp: 30 tablet, Rfl: 5 .  DEXILANT 60 MG capsule, Take 60 mg by mouth daily., Disp: , Rfl: 6 .  diphenoxylate-atropine (LOMOTIL) 2.5-0.025 MG tablet, 1 or 2 pills twice daily as needed for loose stools/diarrhea.  Office visit needed, Disp: 60 tablet, Rfl: 0 .  DULoxetine (CYMBALTA) 60 MG capsule, TAKE 1 CAPSULE BY MOUTH EVERY DAY. Please make PCP appointment., Disp: 30 capsule, Rfl: 0 .  fluticasone (FLONASE) 50 MCG/ACT nasal spray, Place 2 sprays into both nostrils daily., Disp: 1 g, Rfl: 0 .  glucose blood (ACCU-CHEK AVIVA PLUS) test strip, USE TO CHECK BLOOD SUGAR BEFORE MEALS AND AT BEDTIME dx code E10.65, Disp: 125 each, Rfl: 6 .  hydrALAZINE (APRESOLINE) 50 MG tablet, TAKE ONE TABLET (50MG) BY MOUTH  THREE TIMES DAILY, Disp: 90 tablet, Rfl: 11 .  hydrALAZINE (APRESOLINE) 50 MG tablet, Take 1 tablet (50 mg total) by mouth 3 (three) times daily., Disp: 90 tablet, Rfl: 5 .  hydrALAZINE (APRESOLINE) 50 MG tablet, TAKE 1 TABLET BY MOUTH THREE TIMES A DAY, Disp: 30 tablet, Rfl: 0 .  insulin aspart (NOVOLOG FLEXPEN) 100 UNIT/ML FlexPen, Inject 10 Units into the skin 3 (three) times daily with meals., Disp: 15 mL, Rfl: 4 .  insulin lispro (HUMALOG KWIKPEN) 100 UNIT/ML KiwkPen, Inject 0.13 mLs (13 Units total) into the skin 3 (three) times daily., Disp: 15 mL, Rfl: 6 .  Insulin Pen Needle (TRUEPLUS PEN NEEDLES) 32G X 4 MM MISC, Use to inject insulin as directed., Disp: 100 each, Rfl: 11 .  LANTUS SOLOSTAR  100 UNIT/ML Solostar Pen, INJECT 50 UNITS SUBCUTANEOUSLY EVERY DAY, Disp: 15 mL, Rfl: 3 .  oxyCODONE (OXY IR/ROXICODONE) 5 MG immediate release tablet, Take 5 mg by mouth every 6 (six) hours as needed (pain)., Disp: , Rfl:  .  promethazine (PHENERGAN) 12.5 MG tablet, Take 1 tablet (12.5 mg total) by mouth every 8 (eight) hours as needed for nausea or vomiting., Disp: 20 tablet, Rfl: 0 .  topiramate (TOPAMAX) 25 MG tablet, Take 1 tablet (25 mg total) by mouth 2 (two) times daily., Disp: 120 tablet, Rfl: 3 .  zolpidem (AMBIEN) 5 MG tablet, Take 1 tablet (5 mg total) by mouth at bedtime as needed for sleep., Disp: 30 tablet, Rfl: 2  Social History   Tobacco Use  Smoking Status Light Tobacco Smoker  . Packs/day: 0.25  . Years: 22.00  . Pack years: 5.50  . Types: Cigarettes  Smokeless Tobacco Never Used    Allergies  Allergen Reactions  . Lidocaine Itching  . Trazodone Swelling  . Latex Rash   Objective:  There were no vitals filed for this visit. There is no height or weight on file to calculate BMI. Constitutional Well developed. Well nourished.  Vascular Dorsalis pedis pulses palpable bilaterally. Posterior tibial pulses palpable bilaterally. Capillary refill normal to all digits.  No cyanosis or clubbing noted. Pedal hair growth normal.  Neurologic Normal speech. Oriented to person, place, and time. Protective sensation absent  Dermatologic Wound Location: Left Chopart amputation site and wound to the plantar foot Wound Base: Mixed Granular/Fibrotic Peri-wound: Calloused Exudate: Scant/small amount Serosanguinous exudate Wound Measurements: -See below  Orthopedic: No pain to palpation either foot.   Radiographs: 3 views of skeletally mature adult left foot: No cortical irregularity or destruction noted.  No concern for osteomyelitis noted. Assessment:   1. Diabetes mellitus type 2, uncontrolled, with complications ([place])   2. Diabetic ulcer of left heel associated with  diabetes mellitus due to underlying condition, with fat layer exposed ([place])    Plan:  Patient was evaluated and treated and all questions answered.  Ulcer left Chopart amputation site plantar foot wound -Debridement as below. -Dressed with Betadine wet-to-dry, DSD. -Patient has obtained new knee scooter and has been nonweightbearing to the left lower extremity. -Patient is a high risk of losing the leg if the wound worsens or the infection creeps up.  I discussed this with the patient that he is she is a high risk of losing the leg given the amputation that was already done previously  Patient states understanding. -Patient has obtained the wound VAC.  Wound VAC was applied in standard technique in office visit today.  We will plan on doing twice a week office visits for wound VAC  changes. -We will do VAC holiday and apply Betadine wet-to-dry to allow for the maceration to improve.  Procedure: Excisional Debridement of Wound Tool: Sharp chisel blade/tissue nipper Rationale: Removal of non-viable soft tissue from the wound to promote healing.  Anesthesia: none Pre-Debridement Wound Measurements: 1.1 cm x 1.1 cm x 0.6 cm Post-Debridement Wound Measurements: 1.2 cm x 1.2 cm x 0.7 cm Type of Debridement: Sharp Excisional Tissue Removed: Non-viable soft tissue Blood loss: Minimal (<50cc) Depth of Debridement: Muscle Technique: Sharp excisional debridement to bleeding, viable wound base.  Wound Progress: The wound is getting slightly smaller. Dressing: Dry, sterile, compression dressing. Disposition: Patient tolerated procedure well. Patient to return in 1 week for follow-up.  No follow-ups on file.

## 2019-10-14 ENCOUNTER — Other Ambulatory Visit: Payer: Self-pay

## 2019-10-14 ENCOUNTER — Ambulatory Visit (INDEPENDENT_AMBULATORY_CARE_PROVIDER_SITE_OTHER): Payer: Medicaid Other | Admitting: Podiatry

## 2019-10-14 DIAGNOSIS — E08621 Diabetes mellitus due to underlying condition with foot ulcer: Secondary | ICD-10-CM | POA: Diagnosis not present

## 2019-10-14 DIAGNOSIS — E1165 Type 2 diabetes mellitus with hyperglycemia: Secondary | ICD-10-CM

## 2019-10-14 DIAGNOSIS — L97422 Non-pressure chronic ulcer of left heel and midfoot with fat layer exposed: Secondary | ICD-10-CM

## 2019-10-14 DIAGNOSIS — IMO0002 Reserved for concepts with insufficient information to code with codable children: Secondary | ICD-10-CM

## 2019-10-14 DIAGNOSIS — N185 Chronic kidney disease, stage 5: Secondary | ICD-10-CM | POA: Diagnosis not present

## 2019-10-18 ENCOUNTER — Encounter: Payer: Self-pay | Admitting: Podiatry

## 2019-10-18 ENCOUNTER — Other Ambulatory Visit: Payer: Self-pay

## 2019-10-18 ENCOUNTER — Ambulatory Visit (INDEPENDENT_AMBULATORY_CARE_PROVIDER_SITE_OTHER): Payer: Medicaid Other | Admitting: Podiatry

## 2019-10-18 DIAGNOSIS — L97422 Non-pressure chronic ulcer of left heel and midfoot with fat layer exposed: Secondary | ICD-10-CM | POA: Diagnosis not present

## 2019-10-18 DIAGNOSIS — E118 Type 2 diabetes mellitus with unspecified complications: Secondary | ICD-10-CM | POA: Diagnosis not present

## 2019-10-18 DIAGNOSIS — E08621 Diabetes mellitus due to underlying condition with foot ulcer: Secondary | ICD-10-CM

## 2019-10-18 DIAGNOSIS — IMO0002 Reserved for concepts with insufficient information to code with codable children: Secondary | ICD-10-CM

## 2019-10-18 DIAGNOSIS — E1165 Type 2 diabetes mellitus with hyperglycemia: Secondary | ICD-10-CM | POA: Diagnosis not present

## 2019-10-18 NOTE — Progress Notes (Signed)
Subjective:  Patient ID: [name], female    DOB: 1965/05/04,  MRN: [id number]  Chief Complaint  Patient presents with  . Wound Check    Wound vac change    54 y.o. female presents for wound care.  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  Patient presents today with a maceration.  She has been doing Betadine wet-to-dry dressing change.  She denies any other acute complaints.  She has been nonweightbearing with a knee scooter.  Review of Systems: Negative except as noted in the HPI. Denies N/V/F/Ch.  Past Medical History:  Diagnosis Date  . Anxiety 2002  . Chest tightness   . Depression 2001  . Diabetes type 1, uncontrolled ([place])    at age 24  . Diabetic neuropathy ([place])   . Essential hypertension 2015  . GERD (gastroesophageal reflux disease)    about age of 86  . Nausea and vomiting in adult   . Stroke ([place])   . Urinary frequency   . Yeast vaginitis     Current Outpatient Medications:  .  acetaminophen-codeine (TYLENOL #3) 300-30 MG tablet, TAKE 1 TABLET EVERY 8 HOURS AS NEEDED FOR MODERATE PAIN (INS WILL ONLY PAY FOR 21), Disp: 21 tablet, Rfl: 1 .  alosetron (LOTRONEX) 0.5 MG tablet, Take 0.5 mg by mouth 2 (two) times daily., Disp: , Rfl: 6 .  aluminum chloride (DRYSOL) 20 % external solution, Apply topically at bedtime., Disp: 35 mL, Rfl: 5 .  amLODipine (NORVASC) 5 MG tablet, Take 1 tablet (5 mg total) by mouth daily., Disp: 30 tablet, Rfl: 5 .  aspirin EC 81 MG EC tablet, Take 1 tablet (81 mg total) by mouth daily., Disp: 30 tablet, Rfl: 0 .  atorvastatin (LIPITOR) 40 MG tablet, TAKE 1 TABLET (40 MG TOTAL) BY MOUTH DAILY AT 6 PM., Disp: 30 tablet, Rfl: 1 .  BELBUCA 150 MCG FILM, Take 1 strip by mouth 2 (two) times daily., Disp: , Rfl:  .  Blood Glucose Monitoring Suppl (ACCU-CHEK AVIVA PLUS) w/Device KIT, CHECK BLOOD SUGARS THREE TIMES A DAY.  DX:250.03, Disp: 1 kit, Rfl: 0 .  calcium citrate-vitamin D  500-400 MG-UNIT chewable tablet, Chew 1 tablet by mouth 2 (two) times daily., Disp: 180 tablet, Rfl: 1 .  carvedilol (COREG) 6.25 MG tablet, Take 6.25 mg by mouth 2 (two) times daily., Disp: , Rfl:  .  Cholecalciferol (CVS VIT D 5000 HIGH-POTENCY) 5000 units capsule, Take 5,000 Units by mouth daily., Disp: , Rfl:  .  cyclobenzaprine (FLEXERIL) 10 MG tablet, TAKE 1 TABLET AT BEDTIME AS NEEDED FOR MUSCLE SPASM (Patient taking differently: Take 10 mg by mouth at bedtime as needed for muscle spasms. ), Disp: 30 tablet, Rfl: 5 .  DEXILANT 60 MG capsule, Take 60 mg by mouth daily., Disp: , Rfl: 6 .  diphenoxylate-atropine (LOMOTIL) 2.5-0.025 MG tablet, 1 or 2 pills twice daily as needed for loose stools/diarrhea.  Office visit needed, Disp: 60 tablet, Rfl: 0 .  DULoxetine (CYMBALTA) 60 MG capsule, TAKE 1 CAPSULE BY MOUTH EVERY DAY. Please make PCP appointment., Disp: 30 capsule, Rfl: 0 .  fluticasone (FLONASE) 50 MCG/ACT nasal spray, Place 2 sprays into both nostrils daily., Disp: 1 g, Rfl: 0 .  glucose blood (ACCU-CHEK AVIVA PLUS) test strip, USE TO CHECK BLOOD SUGAR BEFORE MEALS AND AT BEDTIME dx code E10.65, Disp: 125 each, Rfl: 6 .  hydrALAZINE (APRESOLINE) 50 MG tablet, TAKE ONE TABLET (50MG) BY MOUTH THREE TIMES  DAILY, Disp: 90 tablet, Rfl: 11 .  hydrALAZINE (APRESOLINE) 50 MG tablet, Take 1 tablet (50 mg total) by mouth 3 (three) times daily., Disp: 90 tablet, Rfl: 5 .  hydrALAZINE (APRESOLINE) 50 MG tablet, TAKE 1 TABLET BY MOUTH THREE TIMES A DAY, Disp: 30 tablet, Rfl: 0 .  insulin aspart (NOVOLOG FLEXPEN) 100 UNIT/ML FlexPen, Inject 10 Units into the skin 3 (three) times daily with meals., Disp: 15 mL, Rfl: 4 .  insulin lispro (HUMALOG KWIKPEN) 100 UNIT/ML KiwkPen, Inject 0.13 mLs (13 Units total) into the skin 3 (three) times daily., Disp: 15 mL, Rfl: 6 .  Insulin Pen Needle (TRUEPLUS PEN NEEDLES) 32G X 4 MM MISC, Use to inject insulin as directed., Disp: 100 each, Rfl: 11 .  LANTUS SOLOSTAR 100  UNIT/ML Solostar Pen, INJECT 50 UNITS SUBCUTANEOUSLY EVERY DAY, Disp: 15 mL, Rfl: 3 .  oxyCODONE (OXY IR/ROXICODONE) 5 MG immediate release tablet, Take 5 mg by mouth every 6 (six) hours as needed (pain)., Disp: , Rfl:  .  promethazine (PHENERGAN) 12.5 MG tablet, Take 1 tablet (12.5 mg total) by mouth every 8 (eight) hours as needed for nausea or vomiting., Disp: 20 tablet, Rfl: 0 .  topiramate (TOPAMAX) 25 MG tablet, Take 1 tablet (25 mg total) by mouth 2 (two) times daily., Disp: 120 tablet, Rfl: 3 .  zolpidem (AMBIEN) 5 MG tablet, Take 1 tablet (5 mg total) by mouth at bedtime as needed for sleep., Disp: 30 tablet, Rfl: 2  Social History   Tobacco Use  Smoking Status Light Tobacco Smoker  . Packs/day: 0.25  . Years: 22.00  . Pack years: 5.50  . Types: Cigarettes  Smokeless Tobacco Never Used    Allergies  Allergen Reactions  . Lidocaine Itching  . Trazodone Swelling  . Latex Rash   Objective:  There were no vitals filed for this visit. There is no height or weight on file to calculate BMI. Constitutional Well developed. Well nourished.  Vascular Dorsalis pedis pulses palpable bilaterally. Posterior tibial pulses palpable bilaterally. Capillary refill normal to all digits.  No cyanosis or clubbing noted. Pedal hair growth normal.  Neurologic Normal speech. Oriented to person, place, and time. Protective sensation absent  Dermatologic Wound Location: Left Chopart amputation site and wound to the plantar foot Wound Base: Mixed Granular/Fibrotic Peri-wound: Calloused Exudate: Scant/small amount Serosanguinous exudate Wound Measurements: -See below  Orthopedic: No pain to palpation either foot.   Radiographs: 3 views of skeletally mature adult left foot: No cortical irregularity or destruction noted.  No concern for osteomyelitis noted. Assessment:   1. 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 Diabetes mellitus type 2, uncontrolled, with complications ([place])    Plan:  Patient was evaluated and treated and all questions answered.  Ulcer left Chopart amputation site plantar foot wound -Debridement as below. -Dressed with Betadine wet-to-dry, DSD. -Patient has obtained new knee scooter and has been nonweightbearing to the left lower extremity. -Patient is a high risk of losing the leg if the wound worsens or the infection creeps up.  I discussed this with the patient that he is she is a high risk of losing the leg given the amputation that was already done previously  Patient states understanding. -Patient has obtained the wound VAC.  Wound VAC was applied in standard technique in office visit today.  We will plan on doing twice a week office visits for wound VAC changes. -VAC  was applied in standard technique set at 125 mmHg.  Procedure: Excisional Debridement of Wound Tool: Sharp chisel blade/tissue nipper Rationale: Removal of non-viable soft tissue from the wound to promote healing.  Anesthesia: none Pre-Debridement Wound Measurements: 1.1 cm x 1.1 cm x 0.5 cm Post-Debridement Wound Measurements: 1.2 cm x 1.2 cm x 0.6 cm Type of Debridement: Sharp Excisional Tissue Removed: Non-viable soft tissue Blood loss: Minimal (<50cc) Depth of Debridement: Muscle Technique: Sharp excisional debridement to bleeding, viable wound base.  Wound Progress: The wound is getting slightly smaller.  The depth is decreasing slowly Dressing: Dry, sterile, compression dressing. Disposition: Patient tolerated procedure well. Patient to return in 1 week for follow-up.  No follow-ups on file.

## 2019-10-18 NOTE — Progress Notes (Signed)
Subjective:  Patient ID: [name], female    DOB: November 07, 1965,  MRN: [id number]  Chief Complaint  Patient presents with  . Wound Check    pt is here for a wound check of the left foot.    54 y.o. female presents for wound care.  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  Patient presents today with a maceration.  She has been doing Betadine wet-to-dry dressing change.  She denies any other acute complaints.  She has been nonweightbearing with a knee scooter.  Review of Systems: Negative except as noted in the HPI. Denies N/V/F/Ch.  Past Medical History:  Diagnosis Date  . Anxiety 2002  . Chest tightness   . Depression 2001  . Diabetes type 1, uncontrolled (HCC)    at age 11  . Diabetic neuropathy (HCC)   . Essential hypertension 2015  . GERD (gastroesophageal reflux disease)    about age of 70  . Nausea and vomiting in adult   . Stroke (HCC)   . Urinary frequency   . Yeast vaginitis     Current Outpatient Medications:  .  acetaminophen-codeine (TYLENOL #3) 300-30 MG tablet, TAKE 1 TABLET EVERY 8 HOURS AS NEEDED FOR MODERATE PAIN (INS WILL ONLY PAY FOR 21), Disp: 21 tablet, Rfl: 1 .  alosetron (LOTRONEX) 0.5 MG tablet, Take 0.5 mg by mouth 2 (two) times daily., Disp: , Rfl: 6 .  aluminum chloride (DRYSOL) 20 % external solution, Apply topically at bedtime., Disp: 35 mL, Rfl: 5 .  amLODipine (NORVASC) 5 MG tablet, Take 1 tablet (5 mg total) by mouth daily., Disp: 30 tablet, Rfl: 5 .  aspirin EC 81 MG EC tablet, Take 1 tablet (81 mg total) by mouth daily., Disp: 30 tablet, Rfl: 0 .  atorvastatin (LIPITOR) 40 MG tablet, TAKE 1 TABLET (40 MG TOTAL) BY MOUTH DAILY AT 6 PM., Disp: 30 tablet, Rfl: 1 .  BELBUCA 150 MCG FILM, Take 1 strip by mouth 2 (two) times daily., Disp: , Rfl:  .  Blood Glucose Monitoring Suppl (ACCU-CHEK AVIVA PLUS) w/Device KIT, CHECK BLOOD SUGARS THREE TIMES A DAY.  DX:250.03, Disp: 1 kit, Rfl: 0 .   calcium citrate-vitamin D 500-400 MG-UNIT chewable tablet, Chew 1 tablet by mouth 2 (two) times daily., Disp: 180 tablet, Rfl: 1 .  carvedilol (COREG) 6.25 MG tablet, Take 6.25 mg by mouth 2 (two) times daily., Disp: , Rfl:  .  Cholecalciferol (CVS VIT D 5000 HIGH-POTENCY) 5000 units capsule, Take 5,000 Units by mouth daily., Disp: , Rfl:  .  cyclobenzaprine (FLEXERIL) 10 MG tablet, TAKE 1 TABLET AT BEDTIME AS NEEDED FOR MUSCLE SPASM (Patient taking differently: Take 10 mg by mouth at bedtime as needed for muscle spasms. ), Disp: 30 tablet, Rfl: 5 .  DEXILANT 60 MG capsule, Take 60 mg by mouth daily., Disp: , Rfl: 6 .  diphenoxylate-atropine (LOMOTIL) 2.5-0.025 MG tablet, 1 or 2 pills twice daily as needed for loose stools/diarrhea.  Office visit needed, Disp: 60 tablet, Rfl: 0 .  DULoxetine (CYMBALTA) 60 MG capsule, TAKE 1 CAPSULE BY MOUTH EVERY DAY. Please make PCP appointment., Disp: 30 capsule, Rfl: 0 .  fluticasone (FLONASE) 50 MCG/ACT nasal spray, Place 2 sprays into both nostrils daily., Disp: 1 g, Rfl: 0 .  glucose blood (ACCU-CHEK AVIVA PLUS) test strip, USE TO CHECK BLOOD SUGAR BEFORE MEALS AND AT BEDTIME dx code E10.65, Disp: 125 each, Rfl: 6 .  hydrALAZINE (APRESOLINE) 50 MG tablet,  TAKE ONE TABLET (50MG) BY MOUTH THREE TIMES DAILY, Disp: 90 tablet, Rfl: 11 .  hydrALAZINE (APRESOLINE) 50 MG tablet, Take 1 tablet (50 mg total) by mouth 3 (three) times daily., Disp: 90 tablet, Rfl: 5 .  hydrALAZINE (APRESOLINE) 50 MG tablet, TAKE 1 TABLET BY MOUTH THREE TIMES A DAY, Disp: 30 tablet, Rfl: 0 .  insulin aspart (NOVOLOG FLEXPEN) 100 UNIT/ML FlexPen, Inject 10 Units into the skin 3 (three) times daily with meals., Disp: 15 mL, Rfl: 4 .  insulin lispro (HUMALOG KWIKPEN) 100 UNIT/ML KiwkPen, Inject 0.13 mLs (13 Units total) into the skin 3 (three) times daily., Disp: 15 mL, Rfl: 6 .  Insulin Pen Needle (TRUEPLUS PEN NEEDLES) 32G X 4 MM MISC, Use to inject insulin as directed., Disp: 100 each, Rfl:  11 .  LANTUS SOLOSTAR 100 UNIT/ML Solostar Pen, INJECT 50 UNITS SUBCUTANEOUSLY EVERY DAY, Disp: 15 mL, Rfl: 3 .  oxyCODONE (OXY IR/ROXICODONE) 5 MG immediate release tablet, Take 5 mg by mouth every 6 (six) hours as needed (pain)., Disp: , Rfl:  .  promethazine (PHENERGAN) 12.5 MG tablet, Take 1 tablet (12.5 mg total) by mouth every 8 (eight) hours as needed for nausea or vomiting., Disp: 20 tablet, Rfl: 0 .  topiramate (TOPAMAX) 25 MG tablet, Take 1 tablet (25 mg total) by mouth 2 (two) times daily., Disp: 120 tablet, Rfl: 3 .  zolpidem (AMBIEN) 5 MG tablet, Take 1 tablet (5 mg total) by mouth at bedtime as needed for sleep., Disp: 30 tablet, Rfl: 2  Social History   Tobacco Use  Smoking Status Light Tobacco Smoker  . Packs/day: 0.25  . Years: 22.00  . Pack years: 5.50  . Types: Cigarettes  Smokeless Tobacco Never Used    Allergies  Allergen Reactions  . Lidocaine Itching  . Trazodone Swelling  . Latex Rash   Objective:  There were no vitals filed for this visit. There is no height or weight on file to calculate BMI. Constitutional Well developed. Well nourished.  Vascular Dorsalis pedis pulses palpable bilaterally. Posterior tibial pulses palpable bilaterally. Capillary refill normal to all digits.  No cyanosis or clubbing noted. Pedal hair growth normal.  Neurologic Normal speech. Oriented to person, place, and time. Protective sensation absent  Dermatologic Wound Location: Left Chopart amputation site and wound to the plantar foot Wound Base: Mixed Granular/Fibrotic Peri-wound: Calloused Exudate: Scant/small amount Serosanguinous exudate Wound Measurements: -See below  Orthopedic: No pain to palpation either foot.   Radiographs: 3 views of skeletally mature adult left foot: No cortical irregularity or destruction noted.  No concern for osteomyelitis noted. Assessment:   1. Diabetes mellitus type 2, uncontrolled, with complications ([place])   2. 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  Ulcer left Chopart amputation site plantar foot wound -Debridement as below. -Dressed with Betadine wet-to-dry, DSD. -Patient has obtained new knee scooter and has been nonweightbearing to the left lower extremity. -Patient is a high risk of losing the leg if the wound worsens or the infection creeps up.  I discussed this with the patient that he is she is a high risk of losing the leg given the amputation that was already done previously  Patient states understanding. -Patient has obtained the wound VAC.  Wound VAC was applied in standard technique in office visit today.  We will plan on doing twice a  week office visits for wound VAC changes. -VAC was applied in standard technique set at 125 mmHg.  Procedure: Excisional Debridement of Wound Tool: Sharp chisel blade/tissue nipper Rationale: Removal of non-viable soft tissue from the wound to promote healing.  Anesthesia: none Pre-Debridement Wound Measurements: 1.1 cm x 1.1 cm x 0.5 cm Post-Debridement Wound Measurements: 1.2 cm x 1.2 cm x 0.6 cm Type of Debridement: Sharp Excisional Tissue Removed: Non-viable soft tissue Blood loss: Minimal (<50cc) Depth of Debridement: Muscle Technique: Sharp excisional debridement to bleeding, viable wound base.  Wound Progress: The wound is getting slightly smaller.  The depth is decreasing slowly Dressing: Dry, sterile, compression dressing. Disposition: Patient tolerated procedure well. Patient to return in 1 week for follow-up.  No follow-ups on file.

## 2019-10-20 DIAGNOSIS — N2581 Secondary hyperparathyroidism of renal origin: Secondary | ICD-10-CM | POA: Diagnosis not present

## 2019-10-20 DIAGNOSIS — D631 Anemia in chronic kidney disease: Secondary | ICD-10-CM | POA: Diagnosis not present

## 2019-10-20 DIAGNOSIS — N185 Chronic kidney disease, stage 5: Secondary | ICD-10-CM | POA: Diagnosis not present

## 2019-10-20 DIAGNOSIS — I120 Hypertensive chronic kidney disease with stage 5 chronic kidney disease or end stage renal disease: Secondary | ICD-10-CM | POA: Diagnosis not present

## 2019-10-21 ENCOUNTER — Ambulatory Visit (INDEPENDENT_AMBULATORY_CARE_PROVIDER_SITE_OTHER): Payer: Medicaid Other | Admitting: Podiatry

## 2019-10-21 ENCOUNTER — Other Ambulatory Visit: Payer: Self-pay

## 2019-10-21 DIAGNOSIS — E1165 Type 2 diabetes mellitus with hyperglycemia: Secondary | ICD-10-CM

## 2019-10-21 DIAGNOSIS — E118 Type 2 diabetes mellitus with unspecified complications: Secondary | ICD-10-CM

## 2019-10-21 DIAGNOSIS — L97422 Non-pressure chronic ulcer of left heel and midfoot with fat layer exposed: Secondary | ICD-10-CM

## 2019-10-21 DIAGNOSIS — IMO0002 Reserved for concepts with insufficient information to code with codable children: Secondary | ICD-10-CM

## 2019-10-21 DIAGNOSIS — E08621 Diabetes mellitus due to underlying condition with foot ulcer: Secondary | ICD-10-CM

## 2019-10-25 ENCOUNTER — Other Ambulatory Visit: Payer: Self-pay

## 2019-10-25 ENCOUNTER — Ambulatory Visit: Payer: Medicaid Other | Admitting: Podiatry

## 2019-10-25 ENCOUNTER — Encounter: Payer: Self-pay | Admitting: Podiatry

## 2019-10-25 ENCOUNTER — Encounter: Payer: Medicaid Other | Admitting: Vascular Surgery

## 2019-10-25 ENCOUNTER — Other Ambulatory Visit (HOSPITAL_COMMUNITY): Payer: Medicaid Other

## 2019-10-25 ENCOUNTER — Encounter (HOSPITAL_COMMUNITY): Payer: Medicaid Other

## 2019-10-25 DIAGNOSIS — E08621 Diabetes mellitus due to underlying condition with foot ulcer: Secondary | ICD-10-CM

## 2019-10-25 DIAGNOSIS — L97422 Non-pressure chronic ulcer of left heel and midfoot with fat layer exposed: Secondary | ICD-10-CM

## 2019-10-25 DIAGNOSIS — E1165 Type 2 diabetes mellitus with hyperglycemia: Secondary | ICD-10-CM | POA: Diagnosis not present

## 2019-10-25 DIAGNOSIS — IMO0002 Reserved for concepts with insufficient information to code with codable children: Secondary | ICD-10-CM

## 2019-10-25 DIAGNOSIS — E118 Type 2 diabetes mellitus with unspecified complications: Secondary | ICD-10-CM

## 2019-10-25 NOTE — Progress Notes (Signed)
Subjective:  Patient ID: [name], female    DOB: Jun 17, 1965,  MRN: [id number]  Chief Complaint  Patient presents with  . Diabetic Ulcer    left heel    54 y.o. female presents for wound care.  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  Patient is here for [facility] change today.  She denies any other acute complaints.  She has been nonweightbearing with a knee scooter  Review of Systems: Negative except as noted in the HPI. Denies N/V/F/Ch.  Past Medical History:  Diagnosis Date  . Anxiety 2002  . Chest tightness   . Depression 2001  . Diabetes type 1, uncontrolled ([place])    at age 45  . Diabetic neuropathy ([place])   . Essential hypertension 2015  . GERD (gastroesophageal reflux disease)    about age of 25  . Nausea and vomiting in adult   . Stroke ([place])   . Urinary frequency   . Yeast vaginitis     Current Outpatient Medications:  .  acetaminophen-codeine (TYLENOL #3) 300-30 MG tablet, TAKE 1 TABLET EVERY 8 HOURS AS NEEDED FOR MODERATE PAIN (INS WILL ONLY PAY FOR 21), Disp: 21 tablet, Rfl: 1 .  alosetron (LOTRONEX) 0.5 MG tablet, Take 0.5 mg by mouth 2 (two) times daily., Disp: , Rfl: 6 .  aluminum chloride (DRYSOL) 20 % external solution, Apply topically at bedtime., Disp: 35 mL, Rfl: 5 .  amLODipine (NORVASC) 5 MG tablet, Take 1 tablet (5 mg total) by mouth daily., Disp: 30 tablet, Rfl: 5 .  aspirin EC 81 MG EC tablet, Take 1 tablet (81 mg total) by mouth daily., Disp: 30 tablet, Rfl: 0 .  atorvastatin (LIPITOR) 40 MG tablet, TAKE 1 TABLET (40 MG TOTAL) BY MOUTH DAILY AT 6 PM., Disp: 30 tablet, Rfl: 1 .  BELBUCA 150 MCG FILM, Take 1 strip by mouth 2 (two) times daily., Disp: , Rfl:  .  Blood Glucose Monitoring Suppl (ACCU-CHEK AVIVA PLUS) w/Device KIT, CHECK BLOOD SUGARS THREE TIMES A DAY.  DX:250.03, Disp: 1 kit, Rfl: 0 .  calcium citrate-vitamin D 500-400 MG-UNIT chewable tablet, Chew 1 tablet by mouth 2 (two) times  daily., Disp: 180 tablet, Rfl: 1 .  carvedilol (COREG) 6.25 MG tablet, Take 6.25 mg by mouth 2 (two) times daily., Disp: , Rfl:  .  Cholecalciferol (CVS VIT D 5000 HIGH-POTENCY) 5000 units capsule, Take 5,000 Units by mouth daily., Disp: , Rfl:  .  cyclobenzaprine (FLEXERIL) 10 MG tablet, TAKE 1 TABLET AT BEDTIME AS NEEDED FOR MUSCLE SPASM (Patient taking differently: Take 10 mg by mouth at bedtime as needed for muscle spasms. ), Disp: 30 tablet, Rfl: 5 .  DEXILANT 60 MG capsule, Take 60 mg by mouth daily., Disp: , Rfl: 6 .  diphenoxylate-atropine (LOMOTIL) 2.5-0.025 MG tablet, 1 or 2 pills twice daily as needed for loose stools/diarrhea.  Office visit needed, Disp: 60 tablet, Rfl: 0 .  DULoxetine (CYMBALTA) 60 MG capsule, TAKE 1 CAPSULE BY MOUTH EVERY DAY. Please make PCP appointment., Disp: 30 capsule, Rfl: 0 .  fluticasone (FLONASE) 50 MCG/ACT nasal spray, Place 2 sprays into both nostrils daily., Disp: 1 g, Rfl: 0 .  glucose blood (ACCU-CHEK AVIVA PLUS) test strip, USE TO CHECK BLOOD SUGAR BEFORE MEALS AND AT BEDTIME dx code E10.65, Disp: 125 each, Rfl: 6 .  hydrALAZINE (APRESOLINE) 50 MG tablet, TAKE ONE TABLET (50MG) BY MOUTH THREE TIMES DAILY, Disp: 90 tablet, Rfl: 11 .  hydrALAZINE (  APRESOLINE) 50 MG tablet, Take 1 tablet (50 mg total) by mouth 3 (three) times daily., Disp: 90 tablet, Rfl: 5 .  hydrALAZINE (APRESOLINE) 50 MG tablet, TAKE 1 TABLET BY MOUTH THREE TIMES A DAY, Disp: 30 tablet, Rfl: 0 .  insulin aspart (NOVOLOG FLEXPEN) 100 UNIT/ML FlexPen, Inject 10 Units into the skin 3 (three) times daily with meals., Disp: 15 mL, Rfl: 4 .  insulin lispro (HUMALOG KWIKPEN) 100 UNIT/ML KiwkPen, Inject 0.13 mLs (13 Units total) into the skin 3 (three) times daily., Disp: 15 mL, Rfl: 6 .  Insulin Pen Needle (TRUEPLUS PEN NEEDLES) 32G X 4 MM MISC, Use to inject insulin as directed., Disp: 100 each, Rfl: 11 .  LANTUS SOLOSTAR 100 UNIT/ML Solostar Pen, INJECT 50 UNITS SUBCUTANEOUSLY EVERY DAY, Disp:  15 mL, Rfl: 3 .  oxyCODONE (OXY IR/ROXICODONE) 5 MG immediate release tablet, Take 5 mg by mouth every 6 (six) hours as needed (pain)., Disp: , Rfl:  .  promethazine (PHENERGAN) 12.5 MG tablet, Take 1 tablet (12.5 mg total) by mouth every 8 (eight) hours as needed for nausea or vomiting., Disp: 20 tablet, Rfl: 0 .  topiramate (TOPAMAX) 25 MG tablet, Take 1 tablet (25 mg total) by mouth 2 (two) times daily., Disp: 120 tablet, Rfl: 3 .  zolpidem (AMBIEN) 5 MG tablet, Take 1 tablet (5 mg total) by mouth at bedtime as needed for sleep., Disp: 30 tablet, Rfl: 2  Social History   Tobacco Use  Smoking Status Light Tobacco Smoker  . Packs/day: 0.25  . Years: 22.00  . Pack years: 5.50  . Types: Cigarettes  Smokeless Tobacco Never Used    Allergies  Allergen Reactions  . Lidocaine Itching  . Trazodone Swelling  . Latex Rash   Objective:  There were no vitals filed for this visit. There is no height or weight on file to calculate BMI. Constitutional Well developed. Well nourished.  Vascular Dorsalis pedis pulses palpable bilaterally. Posterior tibial pulses palpable bilaterally. Capillary refill normal to all digits.  No cyanosis or clubbing noted. Pedal hair growth normal.  Neurologic Normal speech. Oriented to person, place, and time. Protective sensation absent  Dermatologic Wound Location: Left Chopart amputation site and wound to the plantar foot Wound Base: Mixed Granular/Fibrotic Peri-wound: Calloused Exudate: Scant/small amount Serosanguinous exudate Wound Measurements: -See below  Orthopedic: No pain to palpation either foot.   Radiographs: 3 views of skeletally mature adult left foot: No cortical irregularity or destruction noted.  No concern for osteomyelitis noted. Assessment:   1. Diabetic ulcer of left heel associated with diabetes mellitus due to underlying condition, with fat layer exposed ([place])   2. Diabetes mellitus type 2, uncontrolled, with complications ([place])     Plan:  Patient was evaluated and treated and all questions answered.  Ulcer left Chopart amputation site plantar foot wound -Debridement as below. -Dressed with Betadine wet-to-dry, DSD. -Patient has obtained new knee scooter and has been nonweightbearing to the left lower extremity. -Patient is a high risk of losing the leg if the wound worsens or the infection creeps up.  I discussed this with the patient that he is she is a high risk of losing the leg given the amputation that was already done previously  Patient states understanding. -Patient has obtained the wound VAC.  Wound VAC was applied in standard technique in office visit today.  We will plan on doing twice a week office visits for wound VAC changes. -VAC was applied in standard technique set at 125 mmHg.  Procedure: Excisional Debridement of Wound Tool: Sharp chisel blade/tissue nipper Rationale: Removal of non-viable soft tissue from the wound to promote healing.  Anesthesia: none Pre-Debridement Wound Measurements: 1.1 cm x 1.1 cm x 0.4 cm Post-Debridement Wound Measurements: 1.2 cm x 1.2 cm x 0.7 cm Type of Debridement: Sharp Excisional Tissue Removed: Non-viable soft tissue Blood loss: Minimal (<50cc) Depth of Debridement: Muscle Technique: Sharp excisional debridement to bleeding, viable wound base.  Wound Progress: The wound is getting slightly smaller.  The depth is decreasing slowly Dressing: Dry, sterile, compression dressing. Disposition: Patient tolerated procedure well. Patient to return in 1 week for follow-up.  No follow-ups on file.

## 2019-10-26 ENCOUNTER — Encounter: Payer: Self-pay | Admitting: Podiatry

## 2019-10-26 NOTE — Progress Notes (Signed)
Subjective:  Patient ID: DELESIA MARTINEK, female    DOB: 12/04/1965,  MRN: [id number]  Chief Complaint  Patient presents with  . Wound Check    wound vac change.      54 y.o. female presents for wound care.  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  Patient is here for [facility] change today.  However there is some maceration that is present.  She denies any other acute complaints.  She has been nonweightbearing with a knee scooter  Review of Systems: Negative except as noted in the HPI. Denies N/V/F/Ch.  Past Medical History:  Diagnosis Date  . Anxiety 2002  . Chest tightness   . Depression 2001  . Diabetes type 1, uncontrolled ([place])    at age 30  . Diabetic neuropathy ([place])   . Essential hypertension 2015  . GERD (gastroesophageal reflux disease)    about age of 59  . Nausea and vomiting in adult   . Stroke ([place])   . Urinary frequency   . Yeast vaginitis     Current Outpatient Medications:  .  acetaminophen-codeine (TYLENOL #3) 300-30 MG tablet, TAKE 1 TABLET EVERY 8 HOURS AS NEEDED FOR MODERATE PAIN (INS WILL ONLY PAY FOR 21), Disp: 21 tablet, Rfl: 1 .  alosetron (LOTRONEX) 0.5 MG tablet, Take 0.5 mg by mouth 2 (two) times daily., Disp: , Rfl: 6 .  aluminum chloride (DRYSOL) 20 % external solution, Apply topically at bedtime., Disp: 35 mL, Rfl: 5 .  amLODipine (NORVASC) 5 MG tablet, Take 1 tablet (5 mg total) by mouth daily., Disp: 30 tablet, Rfl: 5 .  aspirin EC 81 MG EC tablet, Take 1 tablet (81 mg total) by mouth daily., Disp: 30 tablet, Rfl: 0 .  atorvastatin (LIPITOR) 40 MG tablet, TAKE 1 TABLET (40 MG TOTAL) BY MOUTH DAILY AT 6 PM., Disp: 30 tablet, Rfl: 1 .  BELBUCA 150 MCG FILM, Take 1 strip by mouth 2 (two) times daily., Disp: , Rfl:  .  Blood Glucose Monitoring Suppl (ACCU-CHEK AVIVA PLUS) w/Device KIT, CHECK BLOOD SUGARS THREE TIMES A DAY.  DX:250.03, Disp: 1 kit, Rfl: 0 .  calcium citrate-vitamin D 500-400  MG-UNIT chewable tablet, Chew 1 tablet by mouth 2 (two) times daily., Disp: 180 tablet, Rfl: 1 .  carvedilol (COREG) 6.25 MG tablet, Take 6.25 mg by mouth 2 (two) times daily., Disp: , Rfl:  .  Cholecalciferol (CVS VIT D 5000 HIGH-POTENCY) 5000 units capsule, Take 5,000 Units by mouth daily., Disp: , Rfl:  .  cyclobenzaprine (FLEXERIL) 10 MG tablet, TAKE 1 TABLET AT BEDTIME AS NEEDED FOR MUSCLE SPASM (Patient taking differently: Take 10 mg by mouth at bedtime as needed for muscle spasms. ), Disp: 30 tablet, Rfl: 5 .  DEXILANT 60 MG capsule, Take 60 mg by mouth daily., Disp: , Rfl: 6 .  diphenoxylate-atropine (LOMOTIL) 2.5-0.025 MG tablet, 1 or 2 pills twice daily as needed for loose stools/diarrhea.  Office visit needed, Disp: 60 tablet, Rfl: 0 .  DULoxetine (CYMBALTA) 60 MG capsule, TAKE 1 CAPSULE BY MOUTH EVERY DAY. Please make PCP appointment., Disp: 30 capsule, Rfl: 0 .  fluticasone (FLONASE) 50 MCG/ACT nasal spray, Place 2 sprays into both nostrils daily., Disp: 1 g, Rfl: 0 .  glucose blood (ACCU-CHEK AVIVA PLUS) test strip, USE TO CHECK BLOOD SUGAR BEFORE MEALS AND AT BEDTIME dx code E10.65, Disp: 125 each, Rfl: 6 .  hydrALAZINE (APRESOLINE) 50 MG tablet, TAKE ONE TABLET (50MG) BY  MOUTH THREE TIMES DAILY, Disp: 90 tablet, Rfl: 11 .  hydrALAZINE (APRESOLINE) 50 MG tablet, Take 1 tablet (50 mg total) by mouth 3 (three) times daily., Disp: 90 tablet, Rfl: 5 .  hydrALAZINE (APRESOLINE) 50 MG tablet, TAKE 1 TABLET BY MOUTH THREE TIMES A DAY, Disp: 30 tablet, Rfl: 0 .  insulin aspart (NOVOLOG FLEXPEN) 100 UNIT/ML FlexPen, Inject 10 Units into the skin 3 (three) times daily with meals., Disp: 15 mL, Rfl: 4 .  insulin lispro (HUMALOG KWIKPEN) 100 UNIT/ML KiwkPen, Inject 0.13 mLs (13 Units total) into the skin 3 (three) times daily., Disp: 15 mL, Rfl: 6 .  Insulin Pen Needle (TRUEPLUS PEN NEEDLES) 32G X 4 MM MISC, Use to inject insulin as directed., Disp: 100 each, Rfl: 11 .  LANTUS SOLOSTAR 100 UNIT/ML  Solostar Pen, INJECT 50 UNITS SUBCUTANEOUSLY EVERY DAY, Disp: 15 mL, Rfl: 3 .  oxyCODONE (OXY IR/ROXICODONE) 5 MG immediate release tablet, Take 5 mg by mouth every 6 (six) hours as needed (pain)., Disp: , Rfl:  .  promethazine (PHENERGAN) 12.5 MG tablet, Take 1 tablet (12.5 mg total) by mouth every 8 (eight) hours as needed for nausea or vomiting., Disp: 20 tablet, Rfl: 0 .  topiramate (TOPAMAX) 25 MG tablet, Take 1 tablet (25 mg total) by mouth 2 (two) times daily., Disp: 120 tablet, Rfl: 3 .  zolpidem (AMBIEN) 5 MG tablet, Take 1 tablet (5 mg total) by mouth at bedtime as needed for sleep., Disp: 30 tablet, Rfl: 2  Social History   Tobacco Use  Smoking Status Light Tobacco Smoker  . Packs/day: 0.25  . Years: 22.00  . Pack years: 5.50  . Types: Cigarettes  Smokeless Tobacco Never Used    Allergies  Allergen Reactions  . Lidocaine Itching  . Trazodone Swelling  . Latex Rash   Objective:  There were no vitals filed for this visit. There is no height or weight on file to calculate BMI. Constitutional Well developed. Well nourished.  Vascular Dorsalis pedis pulses palpable bilaterally. Posterior tibial pulses palpable bilaterally. Capillary refill normal to all digits.  No cyanosis or clubbing noted. Pedal hair growth normal.  Neurologic Normal speech. Oriented to person, place, and time. Protective sensation absent  Dermatologic Wound Location: Left Chopart amputation site and wound to the plantar foot Wound Base: Mixed Granular/Fibrotic Peri-wound: Calloused Exudate: Scant/small amount Serosanguinous exudate Wound Measurements: -See below  Orthopedic: No pain to palpation either foot.   Radiographs: 3 views of skeletally mature adult left foot: No cortical irregularity or destruction noted.  No concern for osteomyelitis noted. Assessment:   1. Diabetic ulcer of left heel associated with diabetes mellitus due to underlying condition, with fat layer exposed ([place])   2.  Diabetes mellitus type 2, uncontrolled, with complications ([place])    Plan:  Patient was evaluated and treated and all questions answered.  Ulcer left Chopart amputation site plantar foot wound -Debridement as below. -Dressed with Betadine wet-to-dry, DSD. -Patient has obtained new knee scooter and has been nonweightbearing to the left lower extremity. -Patient is a high risk of losing the leg if the wound worsens or the infection creeps up.  I discussed this with the patient that he is she is a high risk of losing the leg given the amputation that was already done previously  Patient states understanding. -Patient has obtained the wound VAC.  Wound VAC was applied in standard technique in office visit today.  We will plan on doing twice a week office visits for wound  VAC changes. -VAC holiday was given as patient tissue had become too macerated.  Procedure: Excisional Debridement of Wound Tool: Sharp chisel blade/tissue nipper Rationale: Removal of non-viable soft tissue from the wound to promote healing.  Anesthesia: none Pre-Debridement Wound Measurements: 1.1 cm x 1.1 cm x 0.4 cm Post-Debridement Wound Measurements: 1.2 cm x 1.2 cm x 0. 6 cm Type of Debridement: Sharp Excisional Tissue Removed: Non-viable soft tissue Blood loss: Minimal (<50cc) Depth of Debridement: Muscle Technique: Sharp excisional debridement to bleeding, viable wound base.  Wound Progress: The wound is getting slightly smaller.  The depth is decreasing slowly Dressing: Dry, sterile, compression dressing. Disposition: Patient tolerated procedure well. Patient to return in 1 week for follow-up.  No follow-ups on file.

## 2019-10-28 ENCOUNTER — Other Ambulatory Visit: Payer: Self-pay

## 2019-10-28 ENCOUNTER — Ambulatory Visit (INDEPENDENT_AMBULATORY_CARE_PROVIDER_SITE_OTHER): Payer: Medicaid Other | Admitting: Podiatry

## 2019-10-28 ENCOUNTER — Encounter: Payer: Self-pay | Admitting: Podiatry

## 2019-10-28 DIAGNOSIS — E08621 Diabetes mellitus due to underlying condition with foot ulcer: Secondary | ICD-10-CM

## 2019-10-28 DIAGNOSIS — IMO0002 Reserved for concepts with insufficient information to code with codable children: Secondary | ICD-10-CM

## 2019-10-28 DIAGNOSIS — E1165 Type 2 diabetes mellitus with hyperglycemia: Secondary | ICD-10-CM

## 2019-10-28 DIAGNOSIS — L97422 Non-pressure chronic ulcer of left heel and midfoot with fat layer exposed: Secondary | ICD-10-CM | POA: Diagnosis not present

## 2019-10-28 NOTE — Progress Notes (Signed)
Subjective:  Patient ID: [name], female    DOB: [birth year],  MRN: [id number]  Chief Complaint  Patient presents with  . Diabetic Ulcer    Pt states she is having some pain at the moment- states no f/c/v/sob/cp- wound vac was removed last visit- pt did bring in materials for [employer] of needed reappliance     54 y.o. female presents for wound care.  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  Patient is here for [facility] change today.  Maceration has improved she denies any other acute complaints.  She has been nonweightbearing with a knee scooter  Review of Systems: Negative except as noted in the HPI. Denies N/V/F/Ch.  Past Medical History:  Diagnosis Date  . Anxiety 2002  . Chest tightness   . Depression 2001  . Diabetes type 1, uncontrolled ([place])    at age 39  . Diabetic neuropathy ([place])   . Essential hypertension 2015  . GERD (gastroesophageal reflux disease)    about age of 49  . Nausea and vomiting in adult   . Stroke ([place])   . Urinary frequency   . Yeast vaginitis     Current Outpatient Medications:  .  acetaminophen-codeine (TYLENOL #3) 300-30 MG tablet, TAKE 1 TABLET EVERY 8 HOURS AS NEEDED FOR MODERATE PAIN (INS WILL ONLY PAY FOR 21), Disp: 21 tablet, Rfl: 1 .  alosetron (LOTRONEX) 0.5 MG tablet, Take 0.5 mg by mouth 2 (two) times daily., Disp: , Rfl: 6 .  aluminum chloride (DRYSOL) 20 % external solution, Apply topically at bedtime., Disp: 35 mL, Rfl: 5 .  amLODipine (NORVASC) 5 MG tablet, Take 1 tablet (5 mg total) by mouth daily., Disp: 30 tablet, Rfl: 5 .  aspirin EC 81 MG EC tablet, Take 1 tablet (81 mg total) by mouth daily., Disp: 30 tablet, Rfl: 0 .  atorvastatin (LIPITOR) 40 MG tablet, TAKE 1 TABLET (40 MG TOTAL) BY MOUTH DAILY AT 6 PM., Disp: 30 tablet, Rfl: 1 .  BELBUCA 150 MCG FILM, Take 1 strip by mouth 2 (two) times daily., Disp: , Rfl:  .  Blood Glucose Monitoring Suppl (ACCU-CHEK AVIVA PLUS)  w/Device KIT, CHECK BLOOD SUGARS THREE TIMES A DAY.  DX:250.03, Disp: 1 kit, Rfl: 0 .  calcium citrate-vitamin D 500-400 MG-UNIT chewable tablet, Chew 1 tablet by mouth 2 (two) times daily., Disp: 180 tablet, Rfl: 1 .  carvedilol (COREG) 6.25 MG tablet, Take 6.25 mg by mouth 2 (two) times daily., Disp: , Rfl:  .  Cholecalciferol (CVS VIT D 5000 HIGH-POTENCY) 5000 units capsule, Take 5,000 Units by mouth daily., Disp: , Rfl:  .  cyclobenzaprine (FLEXERIL) 10 MG tablet, TAKE 1 TABLET AT BEDTIME AS NEEDED FOR MUSCLE SPASM (Patient taking differently: Take 10 mg by mouth at bedtime as needed for muscle spasms. ), Disp: 30 tablet, Rfl: 5 .  DEXILANT 60 MG capsule, Take 60 mg by mouth daily., Disp: , Rfl: 6 .  diphenoxylate-atropine (LOMOTIL) 2.5-0.025 MG tablet, 1 or 2 pills twice daily as needed for loose stools/diarrhea.  Office visit needed, Disp: 60 tablet, Rfl: 0 .  DULoxetine (CYMBALTA) 60 MG capsule, TAKE 1 CAPSULE BY MOUTH EVERY DAY. Please make PCP appointment., Disp: 30 capsule, Rfl: 0 .  fluticasone (FLONASE) 50 MCG/ACT nasal spray, Place 2 sprays into both nostrils daily., Disp: 1 g, Rfl: 0 .  glucose blood (ACCU-CHEK AVIVA PLUS) test strip, USE TO CHECK BLOOD SUGAR BEFORE MEALS AND AT BEDTIME dx  code E10.65, Disp: 125 each, Rfl: 6 .  hydrALAZINE (APRESOLINE) 50 MG tablet, TAKE ONE TABLET (50MG) BY MOUTH THREE TIMES DAILY, Disp: 90 tablet, Rfl: 11 .  hydrALAZINE (APRESOLINE) 50 MG tablet, Take 1 tablet (50 mg total) by mouth 3 (three) times daily., Disp: 90 tablet, Rfl: 5 .  hydrALAZINE (APRESOLINE) 50 MG tablet, TAKE 1 TABLET BY MOUTH THREE TIMES A DAY, Disp: 30 tablet, Rfl: 0 .  insulin aspart (NOVOLOG FLEXPEN) 100 UNIT/ML FlexPen, Inject 10 Units into the skin 3 (three) times daily with meals., Disp: 15 mL, Rfl: 4 .  insulin lispro (HUMALOG KWIKPEN) 100 UNIT/ML KiwkPen, Inject 0.13 mLs (13 Units total) into the skin 3 (three) times daily., Disp: 15 mL, Rfl: 6 .  Insulin Pen Needle (TRUEPLUS  PEN NEEDLES) 32G X 4 MM MISC, Use to inject insulin as directed., Disp: 100 each, Rfl: 11 .  LANTUS SOLOSTAR 100 UNIT/ML Solostar Pen, INJECT 50 UNITS SUBCUTANEOUSLY EVERY DAY, Disp: 15 mL, Rfl: 3 .  oxyCODONE (OXY IR/ROXICODONE) 5 MG immediate release tablet, Take 5 mg by mouth every 6 (six) hours as needed (pain)., Disp: , Rfl:  .  promethazine (PHENERGAN) 12.5 MG tablet, Take 1 tablet (12.5 mg total) by mouth every 8 (eight) hours as needed for nausea or vomiting., Disp: 20 tablet, Rfl: 0 .  topiramate (TOPAMAX) 25 MG tablet, Take 1 tablet (25 mg total) by mouth 2 (two) times daily., Disp: 120 tablet, Rfl: 3 .  zolpidem (AMBIEN) 5 MG tablet, Take 1 tablet (5 mg total) by mouth at bedtime as needed for sleep., Disp: 30 tablet, Rfl: 2  Social History   Tobacco Use  Smoking Status Light Tobacco Smoker  . Packs/day: 0.25  . Years: 22.00  . Pack years: 5.50  . Types: Cigarettes  Smokeless Tobacco Never Used    Allergies  Allergen Reactions  . Lidocaine Itching  . Trazodone Swelling  . Latex Rash   Objective:  There were no vitals filed for this visit. There is no height or weight on file to calculate BMI. Constitutional Well developed. Well nourished.  Vascular Dorsalis pedis pulses palpable bilaterally. Posterior tibial pulses palpable bilaterally. Capillary refill normal to all digits.  No cyanosis or clubbing noted. Pedal hair growth normal.  Neurologic Normal speech. Oriented to person, place, and time. Protective sensation absent  Dermatologic Wound Location: Left Chopart amputation site and wound to the plantar foot Wound Base: Mixed Granular/Fibrotic Peri-wound: Calloused Exudate: Scant/small amount Serosanguinous exudate Wound Measurements: -See below  Orthopedic: No pain to palpation either foot.   Radiographs: 3 views of skeletally mature adult left foot: No cortical irregularity or destruction noted.  No concern for osteomyelitis noted. Assessment:   No  diagnosis found. Plan:  Patient was evaluated and treated and all questions answered.  Ulcer left Chopart amputation site plantar foot wound -Debridement as below. -Dressed with Betadine wet-to-dry, DSD. -Patient has obtained new knee scooter and has been nonweightbearing to the left lower extremity. -Patient is a high risk of losing the leg if the wound worsens or the infection creeps up.  I discussed this with the patient that he is she is a high risk of losing the leg given the amputation that was already done previously  Patient states understanding. -Patient has obtained the wound VAC.  Wound VAC was applied in standard technique in office visit today.  We will plan on doing twice a week office visits for wound VAC changes. -VAC was applied in standard technique set up 125  mmHg pressure no complication noted.   Procedure: Excisional Debridement of Wound Tool: Sharp chisel blade/tissue nipper Rationale: Removal of non-viable soft tissue from the wound to promote healing.  Anesthesia: none Pre-Debridement Wound Measurements: 1.1 cm x 1.1 cm x 0.3 cm Post-Debridement Wound Measurements: 1.2 cm x 1.2 cm x 0.5 cm Type of Debridement: Sharp Excisional Tissue Removed: Non-viable soft tissue Blood loss: Minimal (<50cc) Depth of Debridement: Muscle Technique: Sharp excisional debridement to bleeding, viable wound base.  Wound Progress: The wound is getting slightly smaller.  The depth is decreasing slowly Dressing: Dry, sterile, compression dressing. Disposition: Patient tolerated procedure well. Patient to return in 1 week for follow-up.  No follow-ups on file.

## 2019-10-30 DIAGNOSIS — T8189XA Other complications of procedures, not elsewhere classified, initial encounter: Secondary | ICD-10-CM | POA: Diagnosis not present

## 2019-11-01 ENCOUNTER — Other Ambulatory Visit: Payer: Self-pay

## 2019-11-01 ENCOUNTER — Ambulatory Visit: Payer: Medicaid Other | Admitting: Podiatry

## 2019-11-01 ENCOUNTER — Encounter: Payer: Self-pay | Admitting: Podiatry

## 2019-11-01 DIAGNOSIS — E118 Type 2 diabetes mellitus with unspecified complications: Secondary | ICD-10-CM

## 2019-11-01 DIAGNOSIS — E08621 Diabetes mellitus due to underlying condition with foot ulcer: Secondary | ICD-10-CM

## 2019-11-01 DIAGNOSIS — L97422 Non-pressure chronic ulcer of left heel and midfoot with fat layer exposed: Secondary | ICD-10-CM | POA: Diagnosis not present

## 2019-11-01 DIAGNOSIS — E1165 Type 2 diabetes mellitus with hyperglycemia: Secondary | ICD-10-CM

## 2019-11-01 DIAGNOSIS — IMO0002 Reserved for concepts with insufficient information to code with codable children: Secondary | ICD-10-CM

## 2019-11-02 ENCOUNTER — Encounter: Payer: Self-pay | Admitting: Podiatry

## 2019-11-02 NOTE — Progress Notes (Signed)
Subjective:  Patient ID: [name], female    DOB: 03-07-1965,  MRN: [id number]  Chief Complaint  Patient presents with  . Wound Check    wound vac change   "I think its looking a little better"    54 y.o. female presents for wound care.  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  Patient is here for [facility] change today.  Maceration is present again.  She denies any other acute complaints.  She has been nonweightbearing with a knee scooter  Review of Systems: Negative except as noted in the HPI. Denies N/V/F/Ch.  Past Medical History:  Diagnosis Date  . Anxiety 2002  . Chest tightness   . Depression 2001  . Diabetes type 1, uncontrolled ([place])    at age 57  . Diabetic neuropathy ([place])   . Essential hypertension 2015  . GERD (gastroesophageal reflux disease)    about age of 52  . Nausea and vomiting in adult   . Stroke ([place])   . Urinary frequency   . Yeast vaginitis     Current Outpatient Medications:  .  acetaminophen-codeine (TYLENOL #3) 300-30 MG tablet, TAKE 1 TABLET EVERY 8 HOURS AS NEEDED FOR MODERATE PAIN (INS WILL ONLY PAY FOR 21), Disp: 21 tablet, Rfl: 1 .  alosetron (LOTRONEX) 0.5 MG tablet, Take 0.5 mg by mouth 2 (two) times daily., Disp: , Rfl: 6 .  aluminum chloride (DRYSOL) 20 % external solution, Apply topically at bedtime., Disp: 35 mL, Rfl: 5 .  amLODipine (NORVASC) 5 MG tablet, Take 1 tablet (5 mg total) by mouth daily., Disp: 30 tablet, Rfl: 5 .  aspirin EC 81 MG EC tablet, Take 1 tablet (81 mg total) by mouth daily., Disp: 30 tablet, Rfl: 0 .  atorvastatin (LIPITOR) 40 MG tablet, TAKE 1 TABLET (40 MG TOTAL) BY MOUTH DAILY AT 6 PM., Disp: 30 tablet, Rfl: 1 .  BELBUCA 150 MCG FILM, Take 1 strip by mouth 2 (two) times daily., Disp: , Rfl:  .  Blood Glucose Monitoring Suppl (ACCU-CHEK AVIVA PLUS) w/Device KIT, CHECK BLOOD SUGARS THREE TIMES A DAY.  DX:250.03, Disp: 1 kit, Rfl: 0 .  calcium citrate-vitamin  D 500-400 MG-UNIT chewable tablet, Chew 1 tablet by mouth 2 (two) times daily., Disp: 180 tablet, Rfl: 1 .  carvedilol (COREG) 6.25 MG tablet, Take 6.25 mg by mouth 2 (two) times daily., Disp: , Rfl:  .  Cholecalciferol (CVS VIT D 5000 HIGH-POTENCY) 5000 units capsule, Take 5,000 Units by mouth daily., Disp: , Rfl:  .  cyclobenzaprine (FLEXERIL) 10 MG tablet, TAKE 1 TABLET AT BEDTIME AS NEEDED FOR MUSCLE SPASM (Patient taking differently: Take 10 mg by mouth at bedtime as needed for muscle spasms. ), Disp: 30 tablet, Rfl: 5 .  DEXILANT 60 MG capsule, Take 60 mg by mouth daily., Disp: , Rfl: 6 .  diphenoxylate-atropine (LOMOTIL) 2.5-0.025 MG tablet, 1 or 2 pills twice daily as needed for loose stools/diarrhea.  Office visit needed, Disp: 60 tablet, Rfl: 0 .  DULoxetine (CYMBALTA) 60 MG capsule, TAKE 1 CAPSULE BY MOUTH EVERY DAY. Please make PCP appointment., Disp: 30 capsule, Rfl: 0 .  fluticasone (FLONASE) 50 MCG/ACT nasal spray, Place 2 sprays into both nostrils daily., Disp: 1 g, Rfl: 0 .  glucose blood (ACCU-CHEK AVIVA PLUS) test strip, USE TO CHECK BLOOD SUGAR BEFORE MEALS AND AT BEDTIME dx code E10.65, Disp: 125 each, Rfl: 6 .  hydrALAZINE (APRESOLINE) 50 MG tablet, TAKE ONE  TABLET (50MG) BY MOUTH THREE TIMES DAILY, Disp: 90 tablet, Rfl: 11 .  hydrALAZINE (APRESOLINE) 50 MG tablet, Take 1 tablet (50 mg total) by mouth 3 (three) times daily., Disp: 90 tablet, Rfl: 5 .  hydrALAZINE (APRESOLINE) 50 MG tablet, TAKE 1 TABLET BY MOUTH THREE TIMES A DAY, Disp: 30 tablet, Rfl: 0 .  insulin aspart (NOVOLOG FLEXPEN) 100 UNIT/ML FlexPen, Inject 10 Units into the skin 3 (three) times daily with meals., Disp: 15 mL, Rfl: 4 .  insulin lispro (HUMALOG KWIKPEN) 100 UNIT/ML KiwkPen, Inject 0.13 mLs (13 Units total) into the skin 3 (three) times daily., Disp: 15 mL, Rfl: 6 .  Insulin Pen Needle (TRUEPLUS PEN NEEDLES) 32G X 4 MM MISC, Use to inject insulin as directed., Disp: 100 each, Rfl: 11 .  LANTUS SOLOSTAR  100 UNIT/ML Solostar Pen, INJECT 50 UNITS SUBCUTANEOUSLY EVERY DAY, Disp: 15 mL, Rfl: 3 .  oxyCODONE (OXY IR/ROXICODONE) 5 MG immediate release tablet, Take 5 mg by mouth every 6 (six) hours as needed (pain)., Disp: , Rfl:  .  promethazine (PHENERGAN) 12.5 MG tablet, Take 1 tablet (12.5 mg total) by mouth every 8 (eight) hours as needed for nausea or vomiting., Disp: 20 tablet, Rfl: 0 .  topiramate (TOPAMAX) 25 MG tablet, Take 1 tablet (25 mg total) by mouth 2 (two) times daily., Disp: 120 tablet, Rfl: 3 .  zolpidem (AMBIEN) 5 MG tablet, Take 1 tablet (5 mg total) by mouth at bedtime as needed for sleep., Disp: 30 tablet, Rfl: 2  Social History   Tobacco Use  Smoking Status Light Tobacco Smoker  . Packs/day: 0.25  . Years: 22.00  . Pack years: 5.50  . Types: Cigarettes  Smokeless Tobacco Never Used    Allergies  Allergen Reactions  . Lidocaine Itching  . Trazodone Swelling  . Latex Rash   Objective:  There were no vitals filed for this visit. There is no height or weight on file to calculate BMI. Constitutional Well developed. Well nourished.  Vascular Dorsalis pedis pulses palpable bilaterally. Posterior tibial pulses palpable bilaterally. Capillary refill normal to all digits.  No cyanosis or clubbing noted. Pedal hair growth normal.  Neurologic Normal speech. Oriented to person, place, and time. Protective sensation absent  Dermatologic Wound Location: Left Chopart amputation site and wound to the plantar foot Wound Base: Mixed Granular/Fibrotic Peri-wound: Calloused Exudate: Scant/small amount Serosanguinous exudate Wound Measurements: -See below  Orthopedic: No pain to palpation either foot.   Radiographs: 3 views of skeletally mature adult left foot: No cortical irregularity or destruction noted.  No concern for osteomyelitis noted. Assessment:   1. 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 Diabetes mellitus type 2, uncontrolled, with complications ([place])    Plan:  Patient was evaluated and treated and all questions answered.  Ulcer left Chopart amputation site plantar foot wound -Debridement as below. -Dressed with Betadine wet-to-dry, DSD. -Patient has obtained new knee scooter and has been nonweightbearing to the left lower extremity. -Patient is a high risk of losing the leg if the wound worsens or the infection creeps up.  I discussed this with the patient that he is she is a high risk of losing the leg given the amputation that was already done previously  Patient states understanding. -Patient has obtained the wound VAC.  Wound VAC was applied in standard technique in office visit today.  We will plan on doing twice a week office  visits for wound VAC changes. -Patient will need VAC holiday as there is too much maceration present.   Procedure: Excisional Debridement of Wound Tool: Sharp chisel blade/tissue nipper Rationale: Removal of non-viable soft tissue from the wound to promote healing.  Anesthesia: none Pre-Debridement Wound Measurements: 1.1 cm x 1.0 cm x 0.3 cm Post-Debridement Wound Measurements: 1.2 cm x 1.1 cm x 0.5 cm Type of Debridement: Sharp Excisional Tissue Removed: Non-viable soft tissue Blood loss: Minimal (<50cc) Depth of Debridement: Muscle Technique: Sharp excisional debridement to bleeding, viable wound base.  Wound Progress: The wound is getting slightly smaller.  The depth is decreasing slowly Dressing: Dry, sterile, compression dressing. Disposition: Patient tolerated procedure well. Patient to return in 1 week for follow-up.  No follow-ups on file.

## 2019-11-04 ENCOUNTER — Ambulatory Visit: Payer: Medicaid Other | Admitting: Podiatry

## 2019-11-04 ENCOUNTER — Other Ambulatory Visit: Payer: Self-pay

## 2019-11-04 ENCOUNTER — Ambulatory Visit (INDEPENDENT_AMBULATORY_CARE_PROVIDER_SITE_OTHER): Payer: Medicaid Other

## 2019-11-04 DIAGNOSIS — E1165 Type 2 diabetes mellitus with hyperglycemia: Secondary | ICD-10-CM

## 2019-11-04 DIAGNOSIS — IMO0002 Reserved for concepts with insufficient information to code with codable children: Secondary | ICD-10-CM

## 2019-11-04 DIAGNOSIS — E08621 Diabetes mellitus due to underlying condition with foot ulcer: Secondary | ICD-10-CM | POA: Diagnosis not present

## 2019-11-04 DIAGNOSIS — L97422 Non-pressure chronic ulcer of left heel and midfoot with fat layer exposed: Secondary | ICD-10-CM

## 2019-11-08 ENCOUNTER — Ambulatory Visit: Payer: Medicaid Other | Admitting: Podiatry

## 2019-11-08 ENCOUNTER — Other Ambulatory Visit: Payer: Self-pay

## 2019-11-08 ENCOUNTER — Encounter: Payer: Self-pay | Admitting: Podiatry

## 2019-11-08 DIAGNOSIS — L97422 Non-pressure chronic ulcer of left heel and midfoot with fat layer exposed: Secondary | ICD-10-CM | POA: Diagnosis not present

## 2019-11-08 DIAGNOSIS — E1165 Type 2 diabetes mellitus with hyperglycemia: Secondary | ICD-10-CM

## 2019-11-08 DIAGNOSIS — E08621 Diabetes mellitus due to underlying condition with foot ulcer: Secondary | ICD-10-CM

## 2019-11-08 DIAGNOSIS — IMO0002 Reserved for concepts with insufficient information to code with codable children: Secondary | ICD-10-CM

## 2019-11-08 NOTE — Progress Notes (Signed)
Subjective:  Patient ID: [name], female    DOB: Jan 05, 1966,  MRN: [id number]  Chief Complaint  Patient presents with  . Routine Post Op    1 week wound check     54 y.o. female presents for wound care.  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  VAC will be applied today is maceration has resolved.  She denies any other acute complaints.  She has been nonweightbearing with a knee scooter  Review of Systems: Negative except as noted in the HPI. Denies N/V/F/Ch.  Past Medical History:  Diagnosis Date  . Anxiety 2002  . Chest tightness   . Depression 2001  . Diabetes type 1, uncontrolled ([place])    at age 22  . Diabetic neuropathy ([place])   . Essential hypertension 2015  . GERD (gastroesophageal reflux disease)    about age of 63  . Nausea and vomiting in adult   . Stroke ([place])   . Urinary frequency   . Yeast vaginitis     Current Outpatient Medications:  .  acetaminophen-codeine (TYLENOL #3) 300-30 MG tablet, TAKE 1 TABLET EVERY 8 HOURS AS NEEDED FOR MODERATE PAIN (INS WILL ONLY PAY FOR 21), Disp: 21 tablet, Rfl: 1 .  alosetron (LOTRONEX) 0.5 MG tablet, Take 0.5 mg by mouth 2 (two) times daily., Disp: , Rfl: 6 .  aluminum chloride (DRYSOL) 20 % external solution, Apply topically at bedtime., Disp: 35 mL, Rfl: 5 .  amLODipine (NORVASC) 5 MG tablet, Take 1 tablet (5 mg total) by mouth daily., Disp: 30 tablet, Rfl: 5 .  aspirin EC 81 MG EC tablet, Take 1 tablet (81 mg total) by mouth daily., Disp: 30 tablet, Rfl: 0 .  atorvastatin (LIPITOR) 40 MG tablet, TAKE 1 TABLET (40 MG TOTAL) BY MOUTH DAILY AT 6 PM., Disp: 30 tablet, Rfl: 1 .  BELBUCA 150 MCG FILM, Take 1 strip by mouth 2 (two) times daily., Disp: , Rfl:  .  Blood Glucose Monitoring Suppl (ACCU-CHEK AVIVA PLUS) w/Device KIT, CHECK BLOOD SUGARS THREE TIMES A DAY.  DX:250.03, Disp: 1 kit, Rfl: 0 .  calcium citrate-vitamin D 500-400 MG-UNIT chewable tablet, Chew 1  tablet by mouth 2 (two) times daily., Disp: 180 tablet, Rfl: 1 .  carvedilol (COREG) 6.25 MG tablet, Take 6.25 mg by mouth 2 (two) times daily., Disp: , Rfl:  .  Cholecalciferol (CVS VIT D 5000 HIGH-POTENCY) 5000 units capsule, Take 5,000 Units by mouth daily., Disp: , Rfl:  .  cyclobenzaprine (FLEXERIL) 10 MG tablet, TAKE 1 TABLET AT BEDTIME AS NEEDED FOR MUSCLE SPASM (Patient taking differently: Take 10 mg by mouth at bedtime as needed for muscle spasms. ), Disp: 30 tablet, Rfl: 5 .  DEXILANT 60 MG capsule, Take 60 mg by mouth daily., Disp: , Rfl: 6 .  diphenoxylate-atropine (LOMOTIL) 2.5-0.025 MG tablet, 1 or 2 pills twice daily as needed for loose stools/diarrhea.  Office visit needed, Disp: 60 tablet, Rfl: 0 .  DULoxetine (CYMBALTA) 60 MG capsule, TAKE 1 CAPSULE BY MOUTH EVERY DAY. Please make PCP appointment., Disp: 30 capsule, Rfl: 0 .  fluticasone (FLONASE) 50 MCG/ACT nasal spray, Place 2 sprays into both nostrils daily., Disp: 1 g, Rfl: 0 .  glucose blood (ACCU-CHEK AVIVA PLUS) test strip, USE TO CHECK BLOOD SUGAR BEFORE MEALS AND AT BEDTIME dx code E10.65, Disp: 125 each, Rfl: 6 .  hydrALAZINE (APRESOLINE) 50 MG tablet, TAKE ONE TABLET (50MG) BY MOUTH THREE TIMES DAILY, Disp: 90  tablet, Rfl: 11 .  hydrALAZINE (APRESOLINE) 50 MG tablet, Take 1 tablet (50 mg total) by mouth 3 (three) times daily., Disp: 90 tablet, Rfl: 5 .  hydrALAZINE (APRESOLINE) 50 MG tablet, TAKE 1 TABLET BY MOUTH THREE TIMES A DAY, Disp: 30 tablet, Rfl: 0 .  insulin aspart (NOVOLOG FLEXPEN) 100 UNIT/ML FlexPen, Inject 10 Units into the skin 3 (three) times daily with meals., Disp: 15 mL, Rfl: 4 .  insulin lispro (HUMALOG KWIKPEN) 100 UNIT/ML KiwkPen, Inject 0.13 mLs (13 Units total) into the skin 3 (three) times daily., Disp: 15 mL, Rfl: 6 .  Insulin Pen Needle (TRUEPLUS PEN NEEDLES) 32G X 4 MM MISC, Use to inject insulin as directed., Disp: 100 each, Rfl: 11 .  LANTUS SOLOSTAR 100 UNIT/ML Solostar Pen, INJECT 50 UNITS  SUBCUTANEOUSLY EVERY DAY, Disp: 15 mL, Rfl: 3 .  oxyCODONE (OXY IR/ROXICODONE) 5 MG immediate release tablet, Take 5 mg by mouth every 6 (six) hours as needed (pain)., Disp: , Rfl:  .  promethazine (PHENERGAN) 12.5 MG tablet, Take 1 tablet (12.5 mg total) by mouth every 8 (eight) hours as needed for nausea or vomiting., Disp: 20 tablet, Rfl: 0 .  topiramate (TOPAMAX) 25 MG tablet, Take 1 tablet (25 mg total) by mouth 2 (two) times daily., Disp: 120 tablet, Rfl: 3 .  zolpidem (AMBIEN) 5 MG tablet, Take 1 tablet (5 mg total) by mouth at bedtime as needed for sleep., Disp: 30 tablet, Rfl: 2  Social History   Tobacco Use  Smoking Status Light Tobacco Smoker  . Packs/day: 0.25  . Years: 22.00  . Pack years: 5.50  . Types: Cigarettes  Smokeless Tobacco Never Used    Allergies  Allergen Reactions  . Lidocaine Itching  . Trazodone Swelling  . Latex Rash   Objective:  There were no vitals filed for this visit. There is no height or weight on file to calculate BMI. Constitutional Well developed. Well nourished.  Vascular Dorsalis pedis pulses palpable bilaterally. Posterior tibial pulses palpable bilaterally. Capillary refill normal to all digits.  No cyanosis or clubbing noted. Pedal hair growth normal.  Neurologic Normal speech. Oriented to person, place, and time. Protective sensation absent  Dermatologic Wound Location: Left Chopart amputation site and wound to the plantar foot Wound Base: Mixed Granular/Fibrotic Peri-wound: Calloused Exudate: Scant/small amount Serosanguinous exudate Wound Measurements: -See below  Orthopedic: No pain to palpation either foot.   Radiographs: 3 views of skeletally mature adult left foot: No cortical irregularity or destruction noted.  No concern for osteomyelitis noted. Assessment:   1. 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 Diabetes mellitus type 2,  uncontrolled, with complications ([place])    Plan:  Patient was evaluated and treated and all questions answered.  Ulcer left Chopart amputation site plantar foot wound -Debridement as below. -Dressed with Betadine wet-to-dry, DSD. -Patient has obtained new knee scooter and has been nonweightbearing to the left lower extremity. -Patient is a high risk of losing the leg if the wound worsens or the infection creeps up.  I discussed this with the patient that he is she is a high risk of losing the leg given the amputation that was already done previously  Patient states understanding. -Patient has obtained the wound VAC.  Wound VAC was applied in standard technique in office visit today.  We will plan on doing twice a week office visits for wound VAC changes. -VAC was applied in  standard technique set at 125 mmHg pressure.   Procedure: Excisional Debridement of Wound Tool: Sharp chisel blade/tissue nipper Rationale: Removal of non-viable soft tissue from the wound to promote healing.  Anesthesia: none Pre-Debridement Wound Measurements: 1.1 cm x 1.0 cm x 0.3 cm Post-Debridement Wound Measurements: 1.2 cm x 1.1 cm x 0.5 cm Type of Debridement: Sharp Excisional Tissue Removed: Non-viable soft tissue Blood loss: Minimal (<50cc) Depth of Debridement: Muscle Technique: Sharp excisional debridement to bleeding, viable wound base.  Wound Progress: The wound is getting slightly smaller.  The depth is decreasing slowly Dressing: Dry, sterile, compression dressing. Disposition: Patient tolerated procedure well. Patient to return in 1 week for follow-up.  No follow-ups on file.

## 2019-11-09 ENCOUNTER — Encounter: Payer: Self-pay | Admitting: Podiatry

## 2019-11-09 NOTE — Progress Notes (Signed)
Subjective:  Patient ID: [name], female    DOB: 06-22-1965,  MRN: [id number]  Chief Complaint  Patient presents with  . Wound Check    wound vac change    54 y.o. female presents for wound care.  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  Maceration present again..  She denies any other acute complaints.  She has been nonweightbearing with a knee scooter  Review of Systems: Negative except as noted in the HPI. Denies N/V/F/Ch.  Past Medical History:  Diagnosis Date  . Anxiety 2002  . Chest tightness   . Depression 2001  . Diabetes type 1, uncontrolled ([place])    at age 89  . Diabetic neuropathy ([place])   . Essential hypertension 2015  . GERD (gastroesophageal reflux disease)    about age of 63  . Nausea and vomiting in adult   . Stroke ([place])   . Urinary frequency   . Yeast vaginitis     Current Outpatient Medications:  .  acetaminophen-codeine (TYLENOL #3) 300-30 MG tablet, TAKE 1 TABLET EVERY 8 HOURS AS NEEDED FOR MODERATE PAIN (INS WILL ONLY PAY FOR 21), Disp: 21 tablet, Rfl: 1 .  alosetron (LOTRONEX) 0.5 MG tablet, Take 0.5 mg by mouth 2 (two) times daily., Disp: , Rfl: 6 .  aluminum chloride (DRYSOL) 20 % external solution, Apply topically at bedtime., Disp: 35 mL, Rfl: 5 .  amLODipine (NORVASC) 5 MG tablet, Take 1 tablet (5 mg total) by mouth daily., Disp: 30 tablet, Rfl: 5 .  aspirin EC 81 MG EC tablet, Take 1 tablet (81 mg total) by mouth daily., Disp: 30 tablet, Rfl: 0 .  atorvastatin (LIPITOR) 40 MG tablet, TAKE 1 TABLET (40 MG TOTAL) BY MOUTH DAILY AT 6 PM., Disp: 30 tablet, Rfl: 1 .  BELBUCA 150 MCG FILM, Take 1 strip by mouth 2 (two) times daily., Disp: , Rfl:  .  Blood Glucose Monitoring Suppl (ACCU-CHEK AVIVA PLUS) w/Device KIT, CHECK BLOOD SUGARS THREE TIMES A DAY.  DX:250.03, Disp: 1 kit, Rfl: 0 .  calcium citrate-vitamin D 500-400 MG-UNIT chewable tablet, Chew 1 tablet by mouth 2 (two) times daily.,  Disp: 180 tablet, Rfl: 1 .  carvedilol (COREG) 6.25 MG tablet, Take 6.25 mg by mouth 2 (two) times daily., Disp: , Rfl:  .  Cholecalciferol (CVS VIT D 5000 HIGH-POTENCY) 5000 units capsule, Take 5,000 Units by mouth daily., Disp: , Rfl:  .  cyclobenzaprine (FLEXERIL) 10 MG tablet, TAKE 1 TABLET AT BEDTIME AS NEEDED FOR MUSCLE SPASM (Patient taking differently: Take 10 mg by mouth at bedtime as needed for muscle spasms. ), Disp: 30 tablet, Rfl: 5 .  DEXILANT 60 MG capsule, Take 60 mg by mouth daily., Disp: , Rfl: 6 .  diphenoxylate-atropine (LOMOTIL) 2.5-0.025 MG tablet, 1 or 2 pills twice daily as needed for loose stools/diarrhea.  Office visit needed, Disp: 60 tablet, Rfl: 0 .  DULoxetine (CYMBALTA) 60 MG capsule, TAKE 1 CAPSULE BY MOUTH EVERY DAY. Please make PCP appointment., Disp: 30 capsule, Rfl: 0 .  fluticasone (FLONASE) 50 MCG/ACT nasal spray, Place 2 sprays into both nostrils daily., Disp: 1 g, Rfl: 0 .  glucose blood (ACCU-CHEK AVIVA PLUS) test strip, USE TO CHECK BLOOD SUGAR BEFORE MEALS AND AT BEDTIME dx code E10.65, Disp: 125 each, Rfl: 6 .  hydrALAZINE (APRESOLINE) 50 MG tablet, TAKE ONE TABLET (50MG) BY MOUTH THREE TIMES DAILY, Disp: 90 tablet, Rfl: 11 .  hydrALAZINE (APRESOLINE) 50 MG  tablet, Take 1 tablet (50 mg total) by mouth 3 (three) times daily., Disp: 90 tablet, Rfl: 5 .  hydrALAZINE (APRESOLINE) 50 MG tablet, TAKE 1 TABLET BY MOUTH THREE TIMES A DAY, Disp: 30 tablet, Rfl: 0 .  insulin aspart (NOVOLOG FLEXPEN) 100 UNIT/ML FlexPen, Inject 10 Units into the skin 3 (three) times daily with meals., Disp: 15 mL, Rfl: 4 .  insulin lispro (HUMALOG KWIKPEN) 100 UNIT/ML KiwkPen, Inject 0.13 mLs (13 Units total) into the skin 3 (three) times daily., Disp: 15 mL, Rfl: 6 .  Insulin Pen Needle (TRUEPLUS PEN NEEDLES) 32G X 4 MM MISC, Use to inject insulin as directed., Disp: 100 each, Rfl: 11 .  LANTUS SOLOSTAR 100 UNIT/ML Solostar Pen, INJECT 50 UNITS SUBCUTANEOUSLY EVERY DAY, Disp: 15 mL,  Rfl: 3 .  oxyCODONE (OXY IR/ROXICODONE) 5 MG immediate release tablet, Take 5 mg by mouth every 6 (six) hours as needed (pain)., Disp: , Rfl:  .  promethazine (PHENERGAN) 12.5 MG tablet, Take 1 tablet (12.5 mg total) by mouth every 8 (eight) hours as needed for nausea or vomiting., Disp: 20 tablet, Rfl: 0 .  topiramate (TOPAMAX) 25 MG tablet, Take 1 tablet (25 mg total) by mouth 2 (two) times daily., Disp: 120 tablet, Rfl: 3 .  zolpidem (AMBIEN) 5 MG tablet, Take 1 tablet (5 mg total) by mouth at bedtime as needed for sleep., Disp: 30 tablet, Rfl: 2  Social History   Tobacco Use  Smoking Status Light Tobacco Smoker  . Packs/day: 0.25  . Years: 22.00  . Pack years: 5.50  . Types: Cigarettes  Smokeless Tobacco Never Used    Allergies  Allergen Reactions  . Lidocaine Itching  . Trazodone Swelling  . Latex Rash   Objective:  There were no vitals filed for this visit. There is no height or weight on file to calculate BMI. Constitutional Well developed. Well nourished.  Vascular Dorsalis pedis pulses palpable bilaterally. Posterior tibial pulses palpable bilaterally. Capillary refill normal to all digits.  No cyanosis or clubbing noted. Pedal hair growth normal.  Neurologic Normal speech. Oriented to person, place, and time. Protective sensation absent  Dermatologic Wound Location: Left Chopart amputation site and wound to the plantar foot Wound Base: Mixed Granular/Fibrotic Peri-wound: Calloused Exudate: Scant/small amount Serosanguinous exudate Wound Measurements: -See below  Orthopedic: No pain to palpation either foot.   Radiographs: 3 views of skeletally mature adult left foot: No cortical irregularity or destruction noted.  No concern for osteomyelitis noted. Assessment:   1. 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 Diabetes mellitus type 2, uncontrolled, with complications ([place])     Plan:  Patient was evaluated and treated and all questions answered.  Ulcer left Chopart amputation site plantar foot wound -Debridement as below. -Dressed with Betadine wet-to-dry, DSD. -Patient has obtained new knee scooter and has been nonweightbearing to the left lower extremity. -Patient is a high risk of losing the leg if the wound worsens or the infection creeps up.  I discussed this with the patient that he is she is a high risk of losing the leg given the amputation that was already done previously  Patient states understanding. -Patient has obtained the wound VAC.  Wound VAC was applied in standard technique in office visit today.  We will plan on doing twice a week office visits for wound VAC changes. -I will do a VAC holiday given that there is maceration present until  next time.  Continue Betadine wet-to-dry dressing -I will also discuss doing ulcer biopsy given the next clinical visit has is becoming more stagnant.  Procedure: Excisional Debridement of Wound~stagnant Tool: Sharp chisel blade/tissue nipper Rationale: Removal of non-viable soft tissue from the wound to promote healing.  Anesthesia: none Pre-Debridement Wound Measurements: 1.1 cm x 1.0 cm x 0.3 cm Post-Debridement Wound Measurements: 1.2 cm x 1.1 cm x 0.5 cm Type of Debridement: Sharp Excisional Tissue Removed: Non-viable soft tissue Blood loss: Minimal (<50cc) Depth of Debridement: Muscle Technique: Sharp excisional debridement to bleeding, viable wound base.  Wound Progress: The wound has become more stagnant and is not decreasing in size. Dressing: Dry, sterile, compression dressing. Disposition: Patient tolerated procedure well. Patient to return in 1 week for follow-up.  No follow-ups on file.

## 2019-11-14 ENCOUNTER — Ambulatory Visit (INDEPENDENT_AMBULATORY_CARE_PROVIDER_SITE_OTHER): Payer: Medicaid Other | Admitting: Podiatry

## 2019-11-14 ENCOUNTER — Ambulatory Visit (INDEPENDENT_AMBULATORY_CARE_PROVIDER_SITE_OTHER): Payer: Medicaid Other

## 2019-11-14 ENCOUNTER — Other Ambulatory Visit: Payer: Self-pay

## 2019-11-14 DIAGNOSIS — L97422 Non-pressure chronic ulcer of left heel and midfoot with fat layer exposed: Secondary | ICD-10-CM

## 2019-11-14 DIAGNOSIS — L989 Disorder of the skin and subcutaneous tissue, unspecified: Secondary | ICD-10-CM

## 2019-11-14 DIAGNOSIS — E08621 Diabetes mellitus due to underlying condition with foot ulcer: Secondary | ICD-10-CM

## 2019-11-15 ENCOUNTER — Encounter: Payer: Self-pay | Admitting: Podiatry

## 2019-11-15 NOTE — Progress Notes (Signed)
Subjective:  Patient ID: [name], female    DOB: March 29, 1965,  MRN: [id number]  Chief Complaint  Patient presents with  . Foot Pain    pt is here for a wound check    54 y.o. female presents for wound care.  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  Maceration present again..  She denies any other acute complaints.  She has been nonweightbearing with a knee scooter  Review of Systems: Negative except as noted in the HPI. Denies N/V/F/Ch.  Past Medical History:  Diagnosis Date  . Anxiety 2002  . Chest tightness   . Depression 2001  . Diabetes type 1, uncontrolled ([place])    at age 25  . Diabetic neuropathy ([place])   . Essential hypertension 2015  . GERD (gastroesophageal reflux disease)    about age of 75  . Nausea and vomiting in adult   . Stroke ([place])   . Urinary frequency   . Yeast vaginitis     Current Outpatient Medications:  .  acetaminophen-codeine (TYLENOL #3) 300-30 MG tablet, TAKE 1 TABLET EVERY 8 HOURS AS NEEDED FOR MODERATE PAIN (INS WILL ONLY PAY FOR 21), Disp: 21 tablet, Rfl: 1 .  alosetron (LOTRONEX) 0.5 MG tablet, Take 0.5 mg by mouth 2 (two) times daily., Disp: , Rfl: 6 .  aluminum chloride (DRYSOL) 20 % external solution, Apply topically at bedtime., Disp: 35 mL, Rfl: 5 .  amLODipine (NORVASC) 5 MG tablet, Take 1 tablet (5 mg total) by mouth daily., Disp: 30 tablet, Rfl: 5 .  aspirin EC 81 MG EC tablet, Take 1 tablet (81 mg total) by mouth daily., Disp: 30 tablet, Rfl: 0 .  atorvastatin (LIPITOR) 40 MG tablet, TAKE 1 TABLET (40 MG TOTAL) BY MOUTH DAILY AT 6 PM., Disp: 30 tablet, Rfl: 1 .  BELBUCA 150 MCG FILM, Take 1 strip by mouth 2 (two) times daily., Disp: , Rfl:  .  Blood Glucose Monitoring Suppl (ACCU-CHEK AVIVA PLUS) w/Device KIT, CHECK BLOOD SUGARS THREE TIMES A DAY.  DX:250.03, Disp: 1 kit, Rfl: 0 .  calcium citrate-vitamin D 500-400 MG-UNIT chewable tablet, Chew 1 tablet by mouth 2 (two)  times daily., Disp: 180 tablet, Rfl: 1 .  carvedilol (COREG) 6.25 MG tablet, Take 6.25 mg by mouth 2 (two) times daily., Disp: , Rfl:  .  Cholecalciferol (CVS VIT D 5000 HIGH-POTENCY) 5000 units capsule, Take 5,000 Units by mouth daily., Disp: , Rfl:  .  cyclobenzaprine (FLEXERIL) 10 MG tablet, TAKE 1 TABLET AT BEDTIME AS NEEDED FOR MUSCLE SPASM (Patient taking differently: Take 10 mg by mouth at bedtime as needed for muscle spasms. ), Disp: 30 tablet, Rfl: 5 .  DEXILANT 60 MG capsule, Take 60 mg by mouth daily., Disp: , Rfl: 6 .  diphenoxylate-atropine (LOMOTIL) 2.5-0.025 MG tablet, 1 or 2 pills twice daily as needed for loose stools/diarrhea.  Office visit needed, Disp: 60 tablet, Rfl: 0 .  DULoxetine (CYMBALTA) 60 MG capsule, TAKE 1 CAPSULE BY MOUTH EVERY DAY. Please make PCP appointment., Disp: 30 capsule, Rfl: 0 .  fluticasone (FLONASE) 50 MCG/ACT nasal spray, Place 2 sprays into both nostrils daily., Disp: 1 g, Rfl: 0 .  glucose blood (ACCU-CHEK AVIVA PLUS) test strip, USE TO CHECK BLOOD SUGAR BEFORE MEALS AND AT BEDTIME dx code E10.65, Disp: 125 each, Rfl: 6 .  hydrALAZINE (APRESOLINE) 50 MG tablet, TAKE ONE TABLET (50MG) BY MOUTH THREE TIMES DAILY, Disp: 90 tablet, Rfl: 11 .  hydrALAZINE (APRESOLINE) 50 MG tablet, Take 1 tablet (50 mg total) by mouth 3 (three) times daily., Disp: 90 tablet, Rfl: 5 .  hydrALAZINE (APRESOLINE) 50 MG tablet, TAKE 1 TABLET BY MOUTH THREE TIMES A DAY, Disp: 30 tablet, Rfl: 0 .  insulin aspart (NOVOLOG FLEXPEN) 100 UNIT/ML FlexPen, Inject 10 Units into the skin 3 (three) times daily with meals., Disp: 15 mL, Rfl: 4 .  insulin lispro (HUMALOG KWIKPEN) 100 UNIT/ML KiwkPen, Inject 0.13 mLs (13 Units total) into the skin 3 (three) times daily., Disp: 15 mL, Rfl: 6 .  Insulin Pen Needle (TRUEPLUS PEN NEEDLES) 32G X 4 MM MISC, Use to inject insulin as directed., Disp: 100 each, Rfl: 11 .  LANTUS SOLOSTAR 100 UNIT/ML Solostar Pen, INJECT 50 UNITS SUBCUTANEOUSLY EVERY DAY,  Disp: 15 mL, Rfl: 3 .  oxyCODONE (OXY IR/ROXICODONE) 5 MG immediate release tablet, Take 5 mg by mouth every 6 (six) hours as needed (pain)., Disp: , Rfl:  .  promethazine (PHENERGAN) 12.5 MG tablet, Take 1 tablet (12.5 mg total) by mouth every 8 (eight) hours as needed for nausea or vomiting., Disp: 20 tablet, Rfl: 0 .  topiramate (TOPAMAX) 25 MG tablet, Take 1 tablet (25 mg total) by mouth 2 (two) times daily., Disp: 120 tablet, Rfl: 3 .  zolpidem (AMBIEN) 5 MG tablet, Take 1 tablet (5 mg total) by mouth at bedtime as needed for sleep., Disp: 30 tablet, Rfl: 2  Social History   Tobacco Use  Smoking Status Light Tobacco Smoker  . Packs/day: 0.25  . Years: 22.00  . Pack years: 5.50  . Types: Cigarettes  Smokeless Tobacco Never Used    Allergies  Allergen Reactions  . Lidocaine Itching  . Trazodone Swelling  . Latex Rash   Objective:  There were no vitals filed for this visit. There is no height or weight on file to calculate BMI. Constitutional Well developed. Well nourished.  Vascular Dorsalis pedis pulses palpable bilaterally. Posterior tibial pulses palpable bilaterally. Capillary refill normal to all digits.  No cyanosis or clubbing noted. Pedal hair growth normal.  Neurologic Normal speech. Oriented to person, place, and time. Protective sensation absent  Dermatologic Wound Location: Left Chopart amputation site and wound to the plantar foot Wound Base: Mixed Granular/Fibrotic Peri-wound: Calloused Exudate: Scant/small amount Serosanguinous exudate Wound Measurements: -See below  Orthopedic: No pain to palpation either foot.   Radiographs: 3 views of skeletally mature adult left foot: No cortical irregularity or destruction noted.  No concern for osteomyelitis noted. Assessment:   1. 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  Ulcer left Chopart amputation site plantar foot wound -Debridement as below. -Dressed with Betadine wet-to-dry, DSD. -Patient has obtained new knee scooter and has been nonweightbearing to the left lower extremity. -Patient is a high risk of losing the leg if the wound worsens or the infection creeps up.  I discussed this with the patient that he is she is a high risk of losing the leg given the amputation that was already done previously  Patient states understanding. -Patient has obtained the wound VAC.  Wound VAC was applied in standard technique in office visit today.  We will plan on doing twice a week office visits for wound VAC changes. -I will do a VAC holiday given that there is maceration present until next time.  Continue Betadine wet-to-dry dressing -  VAC was applied in standard technique set at 125 mmHg pressure -Left foot soft tissue biopsy -I explained to the patient the etiology of soft tissue biopsy and various treatment options were discussed.  This will allow [place] to reevaluate and reassess the bacterial growth that is growing in there.  If there is a concern for any malignancy or any chronic nature to the wound will contact the agency to try to rest the information.  I discussed this with the patient.  Patient states understanding. -3 mm Punch biopsy was obtained in standard technique.  No complication noted.  No pinpoint bleeding noted.  Procedure: Excisional Debridement of Wound~stagnant Tool: Sharp chisel blade/tissue nipper Rationale: Removal of non-viable soft tissue from the wound to promote healing.  Anesthesia: none Pre-Debridement Wound Measurements: 1.1 cm x 1.0 cm x 0.3 cm Post-Debridement Wound Measurements: 1.2 cm x 1.1 cm x 0.5 cm Type of Debridement: Sharp Excisional Tissue Removed: Non-viable soft tissue Blood loss: Minimal (<50cc) Depth of Debridement: Muscle Technique: Sharp excisional debridement to bleeding, viable wound base.  Wound Progress:  The wound has become more stagnant and is not decreasing in size. Dressing: Dry, sterile, compression dressing. Disposition: Patient tolerated procedure well. Patient to return in 1 week for follow-up.  No follow-ups on file.

## 2019-11-17 DIAGNOSIS — R110 Nausea: Secondary | ICD-10-CM | POA: Diagnosis not present

## 2019-11-17 DIAGNOSIS — L97529 Non-pressure chronic ulcer of other part of left foot with unspecified severity: Secondary | ICD-10-CM | POA: Diagnosis not present

## 2019-11-17 DIAGNOSIS — G894 Chronic pain syndrome: Secondary | ICD-10-CM | POA: Diagnosis not present

## 2019-11-17 DIAGNOSIS — Z79899 Other long term (current) drug therapy: Secondary | ICD-10-CM | POA: Diagnosis not present

## 2019-11-17 DIAGNOSIS — T8189XA Other complications of procedures, not elsewhere classified, initial encounter: Secondary | ICD-10-CM | POA: Diagnosis not present

## 2019-11-17 DIAGNOSIS — G546 Phantom limb syndrome with pain: Secondary | ICD-10-CM | POA: Diagnosis not present

## 2019-11-18 ENCOUNTER — Other Ambulatory Visit: Payer: Self-pay

## 2019-11-18 ENCOUNTER — Ambulatory Visit (INDEPENDENT_AMBULATORY_CARE_PROVIDER_SITE_OTHER): Payer: Medicaid Other | Admitting: Podiatry

## 2019-11-18 DIAGNOSIS — E1165 Type 2 diabetes mellitus with hyperglycemia: Secondary | ICD-10-CM | POA: Diagnosis not present

## 2019-11-18 DIAGNOSIS — IMO0002 Reserved for concepts with insufficient information to code with codable children: Secondary | ICD-10-CM

## 2019-11-18 DIAGNOSIS — E08621 Diabetes mellitus due to underlying condition with foot ulcer: Secondary | ICD-10-CM

## 2019-11-18 DIAGNOSIS — L97422 Non-pressure chronic ulcer of left heel and midfoot with fat layer exposed: Secondary | ICD-10-CM

## 2019-11-23 ENCOUNTER — Encounter: Payer: Self-pay | Admitting: Podiatry

## 2019-11-23 NOTE — Progress Notes (Signed)
Subjective:  Patient ID: [name], female    DOB: 1965-04-21,  MRN: [id number]  No chief complaint on file.   54 y.o. female presents for wound care.  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  Maceration present again..  She denies any other acute complaints.  She has been nonweightbearing with a knee scooter  Review of Systems: Negative except as noted in the HPI. Denies N/V/F/Ch.  Past Medical History:  Diagnosis Date  . Anxiety 2002  . Chest tightness   . Depression 2001  . Diabetes type 1, uncontrolled ([place])    at age 35  . Diabetic neuropathy ([place])   . Essential hypertension 2015  . GERD (gastroesophageal reflux disease)    about age of 68  . Nausea and vomiting in adult   . Stroke ([place])   . Urinary frequency   . Yeast vaginitis     Current Outpatient Medications:  .  acetaminophen-codeine (TYLENOL #3) 300-30 MG tablet, TAKE 1 TABLET EVERY 8 HOURS AS NEEDED FOR MODERATE PAIN (INS WILL ONLY PAY FOR 21), Disp: 21 tablet, Rfl: 1 .  alosetron (LOTRONEX) 0.5 MG tablet, Take 0.5 mg by mouth 2 (two) times daily., Disp: , Rfl: 6 .  aluminum chloride (DRYSOL) 20 % external solution, Apply topically at bedtime., Disp: 35 mL, Rfl: 5 .  amLODipine (NORVASC) 5 MG tablet, Take 1 tablet (5 mg total) by mouth daily., Disp: 30 tablet, Rfl: 5 .  aspirin EC 81 MG EC tablet, Take 1 tablet (81 mg total) by mouth daily., Disp: 30 tablet, Rfl: 0 .  atorvastatin (LIPITOR) 40 MG tablet, TAKE 1 TABLET (40 MG TOTAL) BY MOUTH DAILY AT 6 PM., Disp: 30 tablet, Rfl: 1 .  BELBUCA 150 MCG FILM, Take 1 strip by mouth 2 (two) times daily., Disp: , Rfl:  .  Blood Glucose Monitoring Suppl (ACCU-CHEK AVIVA PLUS) w/Device KIT, CHECK BLOOD SUGARS THREE TIMES A DAY.  DX:250.03, Disp: 1 kit, Rfl: 0 .  calcium citrate-vitamin D 500-400 MG-UNIT chewable tablet, Chew 1 tablet by mouth 2 (two) times daily., Disp: 180 tablet, Rfl: 1 .  carvedilol (COREG)  6.25 MG tablet, Take 6.25 mg by mouth 2 (two) times daily., Disp: , Rfl:  .  Cholecalciferol (CVS VIT D 5000 HIGH-POTENCY) 5000 units capsule, Take 5,000 Units by mouth daily., Disp: , Rfl:  .  cyclobenzaprine (FLEXERIL) 10 MG tablet, TAKE 1 TABLET AT BEDTIME AS NEEDED FOR MUSCLE SPASM (Patient taking differently: Take 10 mg by mouth at bedtime as needed for muscle spasms. ), Disp: 30 tablet, Rfl: 5 .  DEXILANT 60 MG capsule, Take 60 mg by mouth daily., Disp: , Rfl: 6 .  diphenoxylate-atropine (LOMOTIL) 2.5-0.025 MG tablet, 1 or 2 pills twice daily as needed for loose stools/diarrhea.  Office visit needed, Disp: 60 tablet, Rfl: 0 .  DULoxetine (CYMBALTA) 60 MG capsule, TAKE 1 CAPSULE BY MOUTH EVERY DAY. Please make PCP appointment., Disp: 30 capsule, Rfl: 0 .  fluticasone (FLONASE) 50 MCG/ACT nasal spray, Place 2 sprays into both nostrils daily., Disp: 1 g, Rfl: 0 .  glucose blood (ACCU-CHEK AVIVA PLUS) test strip, USE TO CHECK BLOOD SUGAR BEFORE MEALS AND AT BEDTIME dx code E10.65, Disp: 125 each, Rfl: 6 .  hydrALAZINE (APRESOLINE) 50 MG tablet, TAKE ONE TABLET (50MG) BY MOUTH THREE TIMES DAILY, Disp: 90 tablet, Rfl: 11 .  hydrALAZINE (APRESOLINE) 50 MG tablet, Take 1 tablet (50 mg total) by mouth 3 (three) times  daily., Disp: 90 tablet, Rfl: 5 .  hydrALAZINE (APRESOLINE) 50 MG tablet, TAKE 1 TABLET BY MOUTH THREE TIMES A DAY, Disp: 30 tablet, Rfl: 0 .  insulin aspart (NOVOLOG FLEXPEN) 100 UNIT/ML FlexPen, Inject 10 Units into the skin 3 (three) times daily with meals., Disp: 15 mL, Rfl: 4 .  insulin lispro (HUMALOG KWIKPEN) 100 UNIT/ML KiwkPen, Inject 0.13 mLs (13 Units total) into the skin 3 (three) times daily., Disp: 15 mL, Rfl: 6 .  Insulin Pen Needle (TRUEPLUS PEN NEEDLES) 32G X 4 MM MISC, Use to inject insulin as directed., Disp: 100 each, Rfl: 11 .  LANTUS SOLOSTAR 100 UNIT/ML Solostar Pen, INJECT 50 UNITS SUBCUTANEOUSLY EVERY DAY, Disp: 15 mL, Rfl: 3 .  oxyCODONE (OXY IR/ROXICODONE) 5 MG  immediate release tablet, Take 5 mg by mouth every 6 (six) hours as needed (pain)., Disp: , Rfl:  .  promethazine (PHENERGAN) 12.5 MG tablet, Take 1 tablet (12.5 mg total) by mouth every 8 (eight) hours as needed for nausea or vomiting., Disp: 20 tablet, Rfl: 0 .  topiramate (TOPAMAX) 25 MG tablet, Take 1 tablet (25 mg total) by mouth 2 (two) times daily., Disp: 120 tablet, Rfl: 3 .  zolpidem (AMBIEN) 5 MG tablet, Take 1 tablet (5 mg total) by mouth at bedtime as needed for sleep., Disp: 30 tablet, Rfl: 2  Social History   Tobacco Use  Smoking Status Light Tobacco Smoker  . Packs/day: 0.25  . Years: 22.00  . Pack years: 5.50  . Types: Cigarettes  Smokeless Tobacco Never Used    Allergies  Allergen Reactions  . Lidocaine Itching  . Trazodone Swelling  . Latex Rash   Objective:  There were no vitals filed for this visit. There is no height or weight on file to calculate BMI. Constitutional Well developed. Well nourished.  Vascular Dorsalis pedis pulses palpable bilaterally. Posterior tibial pulses palpable bilaterally. Capillary refill normal to all digits.  No cyanosis or clubbing noted. Pedal hair growth normal.  Neurologic Normal speech. Oriented to person, place, and time. Protective sensation absent  Dermatologic Wound Location: Left Chopart amputation site and wound to the plantar foot Wound Base: Mixed Granular/Fibrotic Peri-wound: Calloused Exudate: Scant/small amount Serosanguinous exudate Wound Measurements: -See below  Orthopedic: No pain to palpation either foot.   Radiographs: 3 views of skeletally mature adult left foot: No cortical irregularity or destruction noted.  No concern for osteomyelitis noted. Assessment:   1. 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 Diabetes mellitus type 2, uncontrolled, with complications ([place])    Plan:  Patient was evaluated and treated and  all questions answered.  Ulcer left Chopart amputation site plantar foot wound -Debridement as below. -Dressed with Betadine wet-to-dry, DSD. -Continue new knee scooter and has been nonweightbearing to the left lower extremity. -Patient is a high risk of losing the leg if the wound worsens or the infection creeps up.  I discussed this with the patient that he is she is a high risk of losing the leg given the amputation that was already done previously  Patient states understanding. -I explained to the patient the etiology of soft tissue biopsy and various treatment options were discussed.  This will allow [place] to reevaluate and reassess the bacterial growth that is growing in there.  If there is a concern for any malignancy or any chronic nature to the wound will contact the agency to try to rest the information.  I discussed this with the patient.  Patient states understanding. - Awaiting the results of the punch biopsy. -It appears that the wound has reached another point of stagnation.  I believe at this time patient may benefit from plastic surgery evaluation for possible flap to cover the wound site.  Referral to plastic surgery sent in.  Patient agrees with this plan.  Procedure: Excisional Debridement of Wound~stagnant Tool: Sharp chisel blade/tissue nipper Rationale: Removal of non-viable soft tissue from the wound to promote healing.  Anesthesia: none Pre-Debridement Wound Measurements: 1.1 cm x 1.0 cm x 0.3 cm Post-Debridement Wound Measurements: 1.2 cm x 1.1 cm x 0.5 cm Type of Debridement: Sharp Excisional Tissue Removed: Non-viable soft tissue Blood loss: Minimal (<50cc) Depth of Debridement: Muscle Technique: Sharp excisional debridement to bleeding, viable wound base.  Wound Progress: The wound has become more stagnant and is not decreasing in size. Dressing: Dry, sterile, compression dressing. Disposition: Patient tolerated procedure well. Patient to return in 1 week for  follow-up.  No follow-ups on file.

## 2019-11-24 ENCOUNTER — Other Ambulatory Visit: Payer: Self-pay

## 2019-11-24 ENCOUNTER — Ambulatory Visit (INDEPENDENT_AMBULATORY_CARE_PROVIDER_SITE_OTHER): Payer: Medicaid Other | Admitting: Podiatry

## 2019-11-24 DIAGNOSIS — L97422 Non-pressure chronic ulcer of left heel and midfoot with fat layer exposed: Secondary | ICD-10-CM | POA: Diagnosis not present

## 2019-11-24 DIAGNOSIS — E08621 Diabetes mellitus due to underlying condition with foot ulcer: Secondary | ICD-10-CM

## 2019-11-24 DIAGNOSIS — IMO0002 Reserved for concepts with insufficient information to code with codable children: Secondary | ICD-10-CM

## 2019-11-24 DIAGNOSIS — E1165 Type 2 diabetes mellitus with hyperglycemia: Secondary | ICD-10-CM | POA: Diagnosis not present

## 2019-11-24 DIAGNOSIS — R32 Unspecified urinary incontinence: Secondary | ICD-10-CM | POA: Diagnosis not present

## 2019-11-25 ENCOUNTER — Encounter: Payer: Self-pay | Admitting: Podiatry

## 2019-11-25 NOTE — Progress Notes (Signed)
Subjective:  Patient ID: [name], female    DOB: 09/07/1965,  MRN: [id number]  Chief Complaint  Patient presents with  . Wound Check    PT STATES THAT ULCER IS HEALING FINE    54 y.o. female presents for wound care.  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 Maceration resolved. I will encourage her to use Prisma today. Review of Systems: Negative except as noted in the HPI. Denies N/V/F/Ch.  Past Medical History:  Diagnosis Date  . Anxiety 2002  . Chest tightness   . Depression 2001  . Diabetes type 1, uncontrolled ([place])    at age 58  . Diabetic neuropathy ([place])   . Essential hypertension 2015  . GERD (gastroesophageal reflux disease)    about age of 64  . Nausea and vomiting in adult   . Stroke ([place])   . Urinary frequency   . Yeast vaginitis     Current Outpatient Medications:  .  acetaminophen-codeine (TYLENOL #3) 300-30 MG tablet, TAKE 1 TABLET EVERY 8 HOURS AS NEEDED FOR MODERATE PAIN (INS WILL ONLY PAY FOR 21), Disp: 21 tablet, Rfl: 1 .  alosetron (LOTRONEX) 0.5 MG tablet, Take 0.5 mg by mouth 2 (two) times daily., Disp: , Rfl: 6 .  aluminum chloride (DRYSOL) 20 % external solution, Apply topically at bedtime., Disp: 35 mL, Rfl: 5 .  amLODipine (NORVASC) 5 MG tablet, Take 1 tablet (5 mg total) by mouth daily., Disp: 30 tablet, Rfl: 5 .  aspirin EC 81 MG EC tablet, Take 1 tablet (81 mg total) by mouth daily., Disp: 30 tablet, Rfl: 0 .  atorvastatin (LIPITOR) 40 MG tablet, TAKE 1 TABLET (40 MG TOTAL) BY MOUTH DAILY AT 6 PM., Disp: 30 tablet, Rfl: 1 .  BELBUCA 150 MCG FILM, Take 1 strip by mouth 2 (two) times daily., Disp: , Rfl:  .  Blood Glucose Monitoring Suppl (ACCU-CHEK AVIVA PLUS) w/Device KIT, CHECK BLOOD SUGARS THREE TIMES A DAY.  DX:250.03, Disp: 1 kit, Rfl: 0 .  calcium citrate-vitamin D 500-400 MG-UNIT chewable tablet, Chew 1 tablet by mouth 2 (two) times daily., Disp: 180 tablet, Rfl: 1 .   carvedilol (COREG) 6.25 MG tablet, Take 6.25 mg by mouth 2 (two) times daily., Disp: , Rfl:  .  Cholecalciferol ([employer]S VIT D 5000 HIGH-POTENCY) 5000 units capsule, Take 5,000 Units by mouth daily., Disp: , Rfl:  .  cyclobenzaprine (FLEXERIL) 10 MG tablet, TAKE 1 TABLET AT BEDTIME AS NEEDED FOR MUSCLE SPASM (Patient taking differently: Take 10 mg by mouth at bedtime as needed for muscle spasms. ), Disp: 30 tablet, Rfl: 5 .  DEXILANT 60 MG capsule, Take 60 mg by mouth daily., Disp: , Rfl: 6 .  diphenoxylate-atropine (LOMOTIL) 2.5-0.025 MG tablet, 1 or 2 pills twice daily as needed for loose stools/diarrhea.  Office visit needed, Disp: 60 tablet, Rfl: 0 .  DULoxetine (CYMBALTA) 60 MG capsule, TAKE 1 CAPSULE BY MOUTH EVERY DAY. Please make PCP appointment., Disp: 30 capsule, Rfl: 0 .  fluticasone (FLONASE) 50 MCG/ACT nasal spray, Place 2 sprays into both nostrils daily., Disp: 1 g, Rfl: 0 .  glucose blood (ACCU-CHEK AVIVA PLUS) test strip, USE TO CHECK BLOOD SUGAR BEFORE MEALS AND AT BEDTIME dx code E10.65, Disp: 125 each, Rfl: 6 .  hydrALAZINE (APRESOLINE) 50 MG tablet, TAKE ONE TABLET (50MG) BY MOUTH THREE TIMES DAILY, Disp: 90 tablet, Rfl: 11 .  hydrALAZINE (APRESOLINE) 50 MG tablet, Take 1 tablet (50 mg total)  by mouth 3 (three) times daily., Disp: 90 tablet, Rfl: 5 .  hydrALAZINE (APRESOLINE) 50 MG tablet, TAKE 1 TABLET BY MOUTH THREE TIMES A DAY, Disp: 30 tablet, Rfl: 0 .  insulin aspart (NOVOLOG FLEXPEN) 100 UNIT/ML FlexPen, Inject 10 Units into the skin 3 (three) times daily with meals., Disp: 15 mL, Rfl: 4 .  insulin lispro (HUMALOG KWIKPEN) 100 UNIT/ML KiwkPen, Inject 0.13 mLs (13 Units total) into the skin 3 (three) times daily., Disp: 15 mL, Rfl: 6 .  Insulin Pen Needle (TRUEPLUS PEN NEEDLES) 32G X 4 MM MISC, Use to inject insulin as directed., Disp: 100 each, Rfl: 11 .  LANTUS SOLOSTAR 100 UNIT/ML Solostar Pen, INJECT 50 UNITS SUBCUTANEOUSLY EVERY DAY, Disp: 15 mL, Rfl: 3 .  oxyCODONE (OXY  IR/ROXICODONE) 5 MG immediate release tablet, Take 5 mg by mouth every 6 (six) hours as needed (pain)., Disp: , Rfl:  .  promethazine (PHENERGAN) 12.5 MG tablet, Take 1 tablet (12.5 mg total) by mouth every 8 (eight) hours as needed for nausea or vomiting., Disp: 20 tablet, Rfl: 0 .  topiramate (TOPAMAX) 25 MG tablet, Take 1 tablet (25 mg total) by mouth 2 (two) times daily., Disp: 120 tablet, Rfl: 3 .  zolpidem (AMBIEN) 5 MG tablet, Take 1 tablet (5 mg total) by mouth at bedtime as needed for sleep., Disp: 30 tablet, Rfl: 2  Social History   Tobacco Use  Smoking Status Light Tobacco Smoker  . Packs/day: 0.25  . Years: 22.00  . Pack years: 5.50  . Types: Cigarettes  Smokeless Tobacco Never Used    Allergies  Allergen Reactions  . Lidocaine Itching  . Trazodone Swelling  . Latex Rash   Objective:  There were no vitals filed for this visit. There is no height or weight on file to calculate BMI. Constitutional Well developed. Well nourished.  Vascular Dorsalis pedis pulses palpable bilaterally. Posterior tibial pulses palpable bilaterally. Capillary refill normal to all digits.  No cyanosis or clubbing noted. Pedal hair growth normal.  Neurologic Normal speech. Oriented to person, place, and time. Protective sensation absent  Dermatologic Wound Location: Left Chopart amputation site and wound to the plantar foot Wound Base: Mixed Granular/Fibrotic Peri-wound: Calloused Exudate: Scant/small amount Serosanguinous exudate Wound Measurements: -See below  Orthopedic: No pain to palpation either foot.   Radiographs: 3 views of skeletally mature adult left foot: No cortical irregularity or destruction noted.  No concern for osteomyelitis noted. Assessment:   1. 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 Diabetes mellitus type 2, uncontrolled, with complications ([place])    Plan:  Patient was  evaluated and treated and all questions answered.  Ulcer left Chopart amputation site plantar foot wound -Debridement as below. -Dressed with Prisma and encouraged her to change it every other day. -Continue new knee scooter and has been nonweightbearing to the left lower extremity. -Patient is a high risk of losing the leg if the wound worsens or the infection creeps up.  I discussed this with the patient that he is she is a high risk of losing the leg given the amputation that was already done previously  Patient states understanding. -I explained to the patient the etiology of soft tissue biopsy and various treatment options were discussed.  This will allow [place] to reevaluate and reassess the bacterial growth that is growing in there.  If there is a concern for any malignancy or any chronic nature to  the wound will contact the agency to try to rest the information.  I discussed this with the patient.  Patient states understanding. - Awaiting the results of the punch biopsy. -It appears that the wound has reached another point of stagnation.  I believe at this time patient may benefit from plastic surgery evaluation for possible flap to cover the wound site.  Referral to plastic surgery sent in.  Patient agrees with this plan.  Procedure: Excisional Debridement of Wound~stagnant Tool: Sharp chisel blade/tissue nipper Rationale: Removal of non-viable soft tissue from the wound to promote healing.  Anesthesia: none Pre-Debridement Wound Measurements: 1.1 cm x 1.0 cm x 0.3 cm Post-Debridement Wound Measurements: 1.2 cm x 1.1 cm x 0.5 cm Type of Debridement: Sharp Excisional Tissue Removed: Non-viable soft tissue Blood loss: Minimal (<50cc) Depth of Debridement: Muscle Technique: Sharp excisional debridement to bleeding, viable wound base.  Wound Progress: The wound has become more stagnant and is not decreasing in size. Dressing: Dry, sterile, compression dressing. Disposition: Patient tolerated  procedure well. Patient to return in 1 week for follow-up.  No follow-ups on file.

## 2019-11-30 DIAGNOSIS — T8189XA Other complications of procedures, not elsewhere classified, initial encounter: Secondary | ICD-10-CM | POA: Diagnosis not present

## 2019-12-02 ENCOUNTER — Encounter: Payer: Self-pay | Admitting: Podiatry

## 2019-12-02 ENCOUNTER — Ambulatory Visit (INDEPENDENT_AMBULATORY_CARE_PROVIDER_SITE_OTHER): Payer: Medicaid Other | Admitting: Podiatry

## 2019-12-02 ENCOUNTER — Other Ambulatory Visit: Payer: Self-pay

## 2019-12-02 DIAGNOSIS — E118 Type 2 diabetes mellitus with unspecified complications: Secondary | ICD-10-CM

## 2019-12-02 DIAGNOSIS — E08621 Diabetes mellitus due to underlying condition with foot ulcer: Secondary | ICD-10-CM | POA: Diagnosis not present

## 2019-12-02 DIAGNOSIS — IMO0002 Reserved for concepts with insufficient information to code with codable children: Secondary | ICD-10-CM

## 2019-12-02 DIAGNOSIS — E1165 Type 2 diabetes mellitus with hyperglycemia: Secondary | ICD-10-CM | POA: Diagnosis not present

## 2019-12-02 DIAGNOSIS — L97422 Non-pressure chronic ulcer of left heel and midfoot with fat layer exposed: Secondary | ICD-10-CM | POA: Diagnosis not present

## 2019-12-02 MED ORDER — DOXYCYCLINE HYCLATE 100 MG PO TABS: 100.0000 mg | ORAL_TABLET | Freq: Two times a day (BID) | ORAL | 0 refills | Status: AC

## 2019-12-02 NOTE — Progress Notes (Signed)
Subjective:  Patient ID: [name], female    DOB: 09-24-1965,  MRN: [id number]  Chief Complaint  Patient presents with  . Wound Check    pt states that wound is healing fine. pt states she does have swelling in leg and that is the only time she is experiencing pain     54 y.o. female presents for wound care.  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 Maceration resolved.  We will continue to do Betadine wet-to-dry dressing changes.  Patient forgot to make her appointment with the plastics in.  However she states that she will make it now. Review of Systems: Negative except as noted in the HPI. Denies N/V/F/Ch.  Past Medical History:  Diagnosis Date  . Anxiety 2002  . Chest tightness   . Depression 2001  . Diabetes type 1, uncontrolled ([place])    at age 67  . Diabetic neuropathy ([place])   . Essential hypertension 2015  . GERD (gastroesophageal reflux disease)    about age of 7  . Nausea and vomiting in adult   . Stroke ([place])   . Urinary frequency   . Yeast vaginitis     Current Outpatient Medications:  .  acetaminophen-codeine (TYLENOL #3) 300-30 MG tablet, TAKE 1 TABLET EVERY 8 HOURS AS NEEDED FOR MODERATE PAIN (INS WILL ONLY PAY FOR 21), Disp: 21 tablet, Rfl: 1 .  alosetron (LOTRONEX) 0.5 MG tablet, Take 0.5 mg by mouth 2 (two) times daily., Disp: , Rfl: 6 .  aluminum chloride (DRYSOL) 20 % external solution, Apply topically at bedtime., Disp: 35 mL, Rfl: 5 .  amLODipine (NORVASC) 5 MG tablet, Take 1 tablet (5 mg total) by mouth daily., Disp: 30 tablet, Rfl: 5 .  aspirin EC 81 MG EC tablet, Take 1 tablet (81 mg total) by mouth daily., Disp: 30 tablet, Rfl: 0 .  atorvastatin (LIPITOR) 40 MG tablet, TAKE 1 TABLET (40 MG TOTAL) BY MOUTH DAILY AT 6 PM., Disp: 30 tablet, Rfl: 1 .  BELBUCA 150 MCG FILM, Take 1 strip by mouth 2 (two) times daily., Disp: , Rfl:  .  Blood Glucose Monitoring Suppl (ACCU-CHEK AVIVA PLUS) w/Device  KIT, CHECK BLOOD SUGARS THREE TIMES A DAY.  DX:250.03, Disp: 1 kit, Rfl: 0 .  calcium citrate-vitamin D 500-400 MG-UNIT chewable tablet, Chew 1 tablet by mouth 2 (two) times daily., Disp: 180 tablet, Rfl: 1 .  carvedilol (COREG) 6.25 MG tablet, Take 6.25 mg by mouth 2 (two) times daily., Disp: , Rfl:  .  Cholecalciferol (CVS VIT D 5000 HIGH-POTENCY) 5000 units capsule, Take 5,000 Units by mouth daily., Disp: , Rfl:  .  cyclobenzaprine (FLEXERIL) 10 MG tablet, TAKE 1 TABLET AT BEDTIME AS NEEDED FOR MUSCLE SPASM (Patient taking differently: Take 10 mg by mouth at bedtime as needed for muscle spasms. ), Disp: 30 tablet, Rfl: 5 .  DEXILANT 60 MG capsule, Take 60 mg by mouth daily., Disp: , Rfl: 6 .  diphenoxylate-atropine (LOMOTIL) 2.5-0.025 MG tablet, 1 or 2 pills twice daily as needed for loose stools/diarrhea.  Office visit needed, Disp: 60 tablet, Rfl: 0 .  DULoxetine (CYMBALTA) 60 MG capsule, TAKE 1 CAPSULE BY MOUTH EVERY DAY. Please make PCP appointment., Disp: 30 capsule, Rfl: 0 .  fluticasone (FLONASE) 50 MCG/ACT nasal spray, Place 2 sprays into both nostrils daily., Disp: 1 g, Rfl: 0 .  glucose blood (ACCU-CHEK AVIVA PLUS) test strip, USE TO CHECK BLOOD SUGAR BEFORE MEALS AND AT BEDTIME  dx code E10.65, Disp: 125 each, Rfl: 6 .  hydrALAZINE (APRESOLINE) 50 MG tablet, TAKE ONE TABLET (50MG) BY MOUTH THREE TIMES DAILY, Disp: 90 tablet, Rfl: 11 .  hydrALAZINE (APRESOLINE) 50 MG tablet, Take 1 tablet (50 mg total) by mouth 3 (three) times daily., Disp: 90 tablet, Rfl: 5 .  hydrALAZINE (APRESOLINE) 50 MG tablet, TAKE 1 TABLET BY MOUTH THREE TIMES A DAY, Disp: 30 tablet, Rfl: 0 .  insulin aspart (NOVOLOG FLEXPEN) 100 UNIT/ML FlexPen, Inject 10 Units into the skin 3 (three) times daily with meals., Disp: 15 mL, Rfl: 4 .  insulin lispro (HUMALOG KWIKPEN) 100 UNIT/ML KiwkPen, Inject 0.13 mLs (13 Units total) into the skin 3 (three) times daily., Disp: 15 mL, Rfl: 6 .  Insulin Pen Needle (TRUEPLUS PEN  NEEDLES) 32G X 4 MM MISC, Use to inject insulin as directed., Disp: 100 each, Rfl: 11 .  LANTUS SOLOSTAR 100 UNIT/ML Solostar Pen, INJECT 50 UNITS SUBCUTANEOUSLY EVERY DAY, Disp: 15 mL, Rfl: 3 .  oxyCODONE (OXY IR/ROXICODONE) 5 MG immediate release tablet, Take 5 mg by mouth every 6 (six) hours as needed (pain)., Disp: , Rfl:  .  promethazine (PHENERGAN) 12.5 MG tablet, Take 1 tablet (12.5 mg total) by mouth every 8 (eight) hours as needed for nausea or vomiting., Disp: 20 tablet, Rfl: 0 .  topiramate (TOPAMAX) 25 MG tablet, Take 1 tablet (25 mg total) by mouth 2 (two) times daily., Disp: 120 tablet, Rfl: 3 .  zolpidem (AMBIEN) 5 MG tablet, Take 1 tablet (5 mg total) by mouth at bedtime as needed for sleep., Disp: 30 tablet, Rfl: 2 .  doxycycline (VIBRA-TABS) 100 MG tablet, Take 1 tablet (100 mg total) by mouth 2 (two) times daily for 10 days., Disp: 20 tablet, Rfl: 0  Social History   Tobacco Use  Smoking Status Light Tobacco Smoker  . Packs/day: 0.25  . Years: 22.00  . Pack years: 5.50  . Types: Cigarettes  Smokeless Tobacco Never Used    Allergies  Allergen Reactions  . Lidocaine Itching  . Trazodone Swelling  . Latex Rash   Objective:  There were no vitals filed for this visit. There is no height or weight on file to calculate BMI. Constitutional Well developed. Well nourished.  Vascular Dorsalis pedis pulses palpable bilaterally. Posterior tibial pulses palpable bilaterally. Capillary refill normal to all digits.  No cyanosis or clubbing noted. Pedal hair growth normal.  Neurologic Normal speech. Oriented to person, place, and time. Protective sensation absent  Dermatologic Wound Location: Left Chopart amputation site and wound to the plantar foot Wound Base: Mixed Granular/Fibrotic Peri-wound: Calloused Exudate: Scant/small amount Serosanguinous exudate Wound Measurements: -See below  Orthopedic: No pain to palpation either foot.   Radiographs: 3 views of  skeletally mature adult left foot: No cortical irregularity or destruction noted.  No concern for osteomyelitis noted. Assessment:   1. 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 Diabetes mellitus type 2, uncontrolled, with complications ([place])    Plan:  Patient was evaluated and treated and all questions answered.  Ulcer left Chopart amputation site plantar foot wound -Debridement as below. -Dressed with Prisma and encouraged her to change it every other day. -Continue new knee scooter and has been nonweightbearing to the left lower extremity. -Patient is a high risk of losing the leg if the wound worsens or the infection creeps up.  I discussed this with the patient that he is  she is a high risk of losing the leg given the amputation that was already done previously  Patient states understanding. -I explained to the patient the etiology of soft tissue biopsy and various treatment options were discussed.  This will allow [place] to reevaluate and reassess the bacterial growth that is growing in there.  If there is a concern for any malignancy or any chronic nature to the wound will contact the agency to try to rest the information.  I discussed this with the patient.  Patient states understanding. -It appears that the wound has reached another point of stagnation.  I believe at this time patient may benefit from plastic surgery evaluation for possible flap to cover the wound site.  Referral to plastic surgery sent in.  Patient agrees with this plan. -Punch biopsy was negative for any type of malignant process and did not fail to reveal any type of pathological process.  Procedure: Excisional Debridement of Wound~stagnant Tool: Sharp chisel blade/tissue nipper Rationale: Removal of non-viable soft tissue from the wound to promote healing.  Anesthesia: none Pre-Debridement Wound Measurements: 1.1 cm x 1.0 cm x 0.3  cm Post-Debridement Wound Measurements: 1.2 cm x 1.1 cm x 0.5 cm Type of Debridement: Sharp Excisional Tissue Removed: Non-viable soft tissue Blood loss: Minimal (<50cc) Depth of Debridement: Muscle Technique: Sharp excisional debridement to bleeding, viable wound base.  Wound Progress: The wound has become more stagnant and is not decreasing in size. Dressing: Dry, sterile, compression dressing. Disposition: Patient tolerated procedure well. Patient to return in 1 week for follow-up.  No follow-ups on file.

## 2019-12-06 ENCOUNTER — Encounter: Payer: Self-pay | Admitting: Vascular Surgery

## 2019-12-06 ENCOUNTER — Ambulatory Visit (INDEPENDENT_AMBULATORY_CARE_PROVIDER_SITE_OTHER)
Admission: RE | Admit: 2019-12-06 | Discharge: 2019-12-06 | Disposition: A | Discharge status: Home / Self Care | Payer: Medicaid Other | Source: Ambulatory Visit | Attending: Vascular Surgery | Admitting: Vascular Surgery

## 2019-12-06 ENCOUNTER — Other Ambulatory Visit: Payer: Self-pay

## 2019-12-06 ENCOUNTER — Ambulatory Visit (INDEPENDENT_AMBULATORY_CARE_PROVIDER_SITE_OTHER): Payer: Medicaid Other | Admitting: Vascular Surgery

## 2019-12-06 ENCOUNTER — Ambulatory Visit (HOSPITAL_COMMUNITY)
Admission: RE | Admit: 2019-12-06 | Discharge: 2019-12-06 | Disposition: A | Discharge status: Home / Self Care | Payer: Medicaid Other | Source: Ambulatory Visit | Attending: Vascular Surgery | Admitting: Vascular Surgery

## 2019-12-06 DIAGNOSIS — N183 Chronic kidney disease, stage 3 unspecified: Secondary | ICD-10-CM

## 2019-12-06 DIAGNOSIS — E1122 Type 2 diabetes mellitus with diabetic chronic kidney disease: Secondary | ICD-10-CM

## 2019-12-06 DIAGNOSIS — N185 Chronic kidney disease, stage 5: Secondary | ICD-10-CM | POA: Insufficient documentation

## 2019-12-06 NOTE — Progress Notes (Signed)
Patient name: [name] MRN: [id number] DOB: June 26, 1965 Sex: female  REASON FOR CONSULT: Evaluate for permanent dialysis access  HPI: [name] is a 54 y.o. female, with history of diabetes, hypertension, stage V CKD the presents for evaluation of permanent dialysis access.  Patient denies any previous upper extremity access in the past.  No chest wall implants or other catheters.  She is not on dialysis at this time.  She is right-handed.  She denies any upper extremity numbness or tingling at this time.  The referral from nephrology states placed AV fistula now and wait on AV graft.  Under the care of Dr. Posey Pronto.  She states he told her 9-18 months likely before needing dialysis.  Past Medical History:  Diagnosis Date  . Anxiety 2002  . Chest tightness   . Depression 2001  . Diabetes type 1, uncontrolled ([place])    at age 37  . Diabetic neuropathy ([place])   . Essential hypertension 2015  . GERD (gastroesophageal reflux disease)    about age of 20  . Nausea and vomiting in adult   . Stroke ([place])   . Urinary frequency   . Yeast vaginitis     Past Surgical History:  Procedure Laterality Date  . ACHILLES TENDON SURGERY Left 03/10/2013   Procedure: LEFT CHOPART AMPUTATION/ LEFT TENDON ACHILLES [place];  Surgeon: Wylene Simmer, MD;  Location: [facility];  Service: Orthopedics;  Laterality: Left;  . AMPUTATION Left 06/15/2012   Procedure: AMPUTATION DIGIT;  Surgeon: Meredith Pel, MD;  Location: [facility];  Service: Orthopedics;  Laterality: Left;  Left great toe revision amputation  . ENDOMETRIAL ABLATION    . IR ANGIOGRAM EXTREMITY LEFT  12/17/2018  . IR FEM POP ART ATHERECT INC PTA MOD SED  12/17/2018  . IR RADIOLOGIST EVAL & MGMT  11/30/2018  . IR RADIOLOGIST EVAL & MGMT  12/09/2018  . IR RADIOLOGIST EVAL & MGMT  02/08/2019  . IR US GUIDE VASC ACCESS RIGHT  12/17/2018  . LAPAROSCOPIC CHOLECYSTECTOMY  01/30/2015   [facility] surgery     Family History  Problem Relation Age of  Onset  . Diabetes Mother   . Heart attack Mother   . Heart disease Mother        before age 72  . Hypertension Mother   . Hyperlipidemia Mother   . Diabetes Father   . Heart attack Father   . Heart disease Father        before age 1  . Diabetes Sister   . Hypertension Sister     SOCIAL HISTORY: Social History   Socioeconomic History  . Marital status: Divorced    Spouse name: Not on file  . Number of children: 1  . Years of education: Not on file  . Highest education level: Not on file  Occupational History    Employer: [employer]  Tobacco Use  . Smoking status: Light Tobacco Smoker    Packs/day: 0.25    Years: 22.00    Pack years: 5.50    Types: Cigarettes  . Smokeless tobacco: Never Used  Vaping Use  . Vaping Use: Never used  Substance and Sexual Activity  . Alcohol use: Yes    Alcohol/week: 0.0 standard drinks    Comment: sociial  . Drug use: No  . Sexual activity: Yes    Partners: Male  Other Topics Concern  . Not on file  Social History Narrative   Lives at home with her daughter  Right handed   Caffeine: 3 cups daily   Social Determinants of Health   Financial Resource Strain:   . Difficulty of Paying Living Expenses: Not on file  Food Insecurity:   . Worried About Charity fundraiser in the Last Year: Not on file  . Ran Out of Food in the Last Year: Not on file  Transportation Needs:   . Lack of Transportation (Medical): Not on file  . Lack of Transportation (Non-Medical): Not on file  Physical Activity:   . Days of Exercise per Week: Not on file  . Minutes of Exercise per Session: Not on file  Stress:   . Feeling of Stress : Not on file  Social Connections:   . Frequency of Communication with Friends and Family: Not on file  . Frequency of Social Gatherings with Friends and Family: Not on file  . Attends Religious Services: Not on file  . Active Member of Clubs or Organizations: Not on file  . Attends Archivist Meetings: Not on file    . Marital Status: Not on file  Intimate Partner Violence:   . Fear of Current or Ex-Partner: Not on file  . Emotionally Abused: Not on file  . Physically Abused: Not on file  . Sexually Abused: Not on file    Allergies  Allergen Reactions  . Lidocaine Itching  . Trazodone Swelling  . Latex Rash    Current Outpatient Medications  Medication Sig Dispense Refill  . alosetron (LOTRONEX) 0.5 MG tablet Take 0.5 mg by mouth 2 (two) times daily.  6  . amLODipine (NORVASC) 5 MG tablet Take 1 tablet (5 mg total) by mouth daily. 30 tablet 5  . aspirin EC 81 MG EC tablet Take 1 tablet (81 mg total) by mouth daily. 30 tablet 0  . atorvastatin (LIPITOR) 40 MG tablet TAKE 1 TABLET (40 MG TOTAL) BY MOUTH DAILY AT 6 PM. 30 tablet 1  . BELBUCA 150 MCG FILM Take 1 strip by mouth 2 (two) times daily.    . Blood Glucose Monitoring Suppl (ACCU-CHEK AVIVA PLUS) w/Device KIT CHECK BLOOD SUGARS THREE TIMES A DAY.  DX:250.03 1 kit 0  . calcium citrate-vitamin D 500-400 MG-UNIT chewable tablet Chew 1 tablet by mouth 2 (two) times daily. 180 tablet 1  . carvedilol (COREG) 6.25 MG tablet Take 6.25 mg by mouth 2 (two) times daily.    . cyclobenzaprine (FLEXERIL) 10 MG tablet TAKE 1 TABLET AT BEDTIME AS NEEDED FOR MUSCLE SPASM (Patient taking differently: Take 10 mg by mouth at bedtime as needed for muscle spasms. ) 30 tablet 5  . DEXILANT 60 MG capsule Take 60 mg by mouth daily.  6  . diphenoxylate-atropine (LOMOTIL) 2.5-0.025 MG tablet 1 or 2 pills twice daily as needed for loose stools/diarrhea.  Office visit needed 60 tablet 0  . doxycycline (VIBRA-TABS) 100 MG tablet Take 1 tablet (100 mg total) by mouth 2 (two) times daily for 10 days. 20 tablet 0  . DULoxetine (CYMBALTA) 60 MG capsule TAKE 1 CAPSULE BY MOUTH EVERY DAY. Please make PCP appointment. 30 capsule 0  . fluticasone (FLONASE) 50 MCG/ACT nasal spray Place 2 sprays into both nostrils daily. 1 g 0  . glucose blood (ACCU-CHEK AVIVA PLUS) test strip USE  TO CHECK BLOOD SUGAR BEFORE MEALS AND AT BEDTIME dx code E10.65 125 each 6  . hydrALAZINE (APRESOLINE) 50 MG tablet TAKE ONE TABLET (50MG) BY MOUTH THREE TIMES DAILY 90 tablet 11  . hydrALAZINE (APRESOLINE) 50  MG tablet Take 1 tablet (50 mg total) by mouth 3 (three) times daily. 90 tablet 5  . hydrALAZINE (APRESOLINE) 50 MG tablet TAKE 1 TABLET BY MOUTH THREE TIMES A DAY 30 tablet 0  . insulin aspart (NOVOLOG FLEXPEN) 100 UNIT/ML FlexPen Inject 10 Units into the skin 3 (three) times daily with meals. 15 mL 4  . insulin lispro (HUMALOG KWIKPEN) 100 UNIT/ML KiwkPen Inject 0.13 mLs (13 Units total) into the skin 3 (three) times daily. 15 mL 6  . Insulin Pen Needle (TRUEPLUS PEN NEEDLES) 32G X 4 MM MISC Use to inject insulin as directed. 100 each 11  . LANTUS SOLOSTAR 100 UNIT/ML Solostar Pen INJECT 50 UNITS SUBCUTANEOUSLY EVERY DAY 15 mL 3  . oxyCODONE (OXY IR/ROXICODONE) 5 MG immediate release tablet Take 5 mg by mouth every 6 (six) hours as needed (pain).    . promethazine (PHENERGAN) 12.5 MG tablet Take 1 tablet (12.5 mg total) by mouth every 8 (eight) hours as needed for nausea or vomiting. 20 tablet 0  . zolpidem (AMBIEN) 5 MG tablet Take 1 tablet (5 mg total) by mouth at bedtime as needed for sleep. 30 tablet 2  . acetaminophen-codeine (TYLENOL #3) 300-30 MG tablet TAKE 1 TABLET EVERY 8 HOURS AS NEEDED FOR MODERATE PAIN (INS WILL ONLY PAY FOR 21) (Patient not taking: Reported on 12/06/2019) 21 tablet 1  . aluminum chloride (DRYSOL) 20 % external solution Apply topically at bedtime. (Patient not taking: Reported on 12/06/2019) 35 mL 5  . Cholecalciferol (CVS VIT D 5000 HIGH-POTENCY) 125 MCG (5000 UT) capsule Take 5,000 Units by mouth daily. (Patient not taking: Reported on 12/06/2019)    . topiramate (TOPAMAX) 25 MG tablet Take 1 tablet (25 mg total) by mouth 2 (two) times daily. (Patient not taking: Reported on 12/06/2019) 120 tablet 3   No current facility-administered medications for this visit.     REVIEW OF SYSTEMS:  _0  denotes positive finding, _1  denotes negative finding Cardiac  Comments:  Chest pain or chest pressure:    Shortness of breath upon exertion:    Short of breath when lying flat:    Irregular heart rhythm:        Vascular    Pain in calf, thigh, or hip brought on by ambulation:    Pain in feet at night that wakes you up from your sleep:     Blood clot in your veins:    Leg swelling:         Pulmonary    Oxygen at home:    Productive cough:     Wheezing:         Neurologic    Sudden weakness in arms or legs:     Sudden numbness in arms or legs:     Sudden onset of difficulty speaking or slurred speech:    Temporary loss of vision in one eye:     Problems with dizziness:         Gastrointestinal    Blood in stool:     Vomited blood:         Genitourinary    Burning when urinating:     Blood in urine:        Psychiatric    Major depression:         Hematologic    Bleeding problems:    Problems with blood clotting too easily:        Skin    Rashes or ulcers:  Constitutional    Fever or chills:      PHYSICAL EXAM: Vitals:   12/06/19 1410  BP: (!) 165/70  Pulse: 66  Resp: 16  Temp: 97.8 F (36.6 C)  TempSrc: Temporal  SpO2: 94%  Weight: 198 lb (89.8 kg)  Height: _0  (1.778 m)    GENERAL: The patient is a well-nourished female, in no acute distress. The vital signs are documented above. CARDIAC: There is a regular rate and rhythm.  VASCULAR:  Palpable radial and brachial pulses bilateral upper extremities No upper extremity tissue loss No chest wall implants PULMONARY: There is good air exchange bilaterally without wheezing or rales. ABDOMEN: Soft and non-tender. MUSCULOSKELETAL: There are no major deformities or cyanosis. NEUROLOGIC: No focal weakness or paresthesias are detected. SKIN: There are no ulcers or rashes noted. PSYCHIATRIC: The patient has a normal affect.  DATA:   Upper extremity arterial duplex  shows triphasic waveforms in both upper extremities.  Upper extremity vein mapping shows usable cephalic and basilic veins in both upper extremities.  Assessment/Plan:  54 year old female with stage V CKD secondary to diabetes and hypertension that presents for evaluation of permanent dialysis access.  Patient is right-handed and we discussed access placement in the nondominant arm which would be her left arm.  After review of vein mapping looks like she has usable cephalic and basilic veins in the left arm.  We discussed risk and benefits including risk of infection, bleeding, steal syndrome, failure to mature in addition to potentially needing secondary interventions in the future to maintain primary patency.  I offered to get her scheduled as soon as possible and she wants to wait until November due to moving and other family stressors.  Our office will contact her to schedule in November accordingly.     Marty Heck, MD [facility] Office: [phone]

## 2019-12-07 ENCOUNTER — Telehealth: Payer: Self-pay | Admitting: Podiatry

## 2019-12-07 NOTE — Telephone Encounter (Signed)
Dr. Posey Pronto signed the needed forms and were faxed

## 2019-12-07 NOTE — Telephone Encounter (Signed)
Dr. Posey ProntoColletta Maryland from [facility] called stating that she faxed over papers to sign in order to continue the Wound VAC. If she is still on the wound VAC will you please refax the papers? I will put the form on your computer.

## 2019-12-08 ENCOUNTER — Telehealth: Payer: Self-pay | Admitting: Podiatrist

## 2019-12-08 NOTE — Telephone Encounter (Signed)
Done- renewal for 2 mo sent

## 2019-12-08 NOTE — Telephone Encounter (Signed)
I spoke with [name] and she said they did receive the paper you faxed yesterday- the date range on that was 11/17/19 for 1 month so that order is almost expired. She sent over another paper dated 10/16-  she also said you can renew anywhere from 1 to 4 months.  Would you like this renewed for 1,2,3 or 4 months? Thanks!

## 2019-12-08 NOTE — Telephone Encounter (Signed)
I signed and sent the paper yesterday and had it faxed back over.  I am not sure what happened to the paper.

## 2019-12-08 NOTE — Telephone Encounter (Signed)
[name] from [facility] called regarding a renewal Rx for [name]-  She states the renewal request is expired and if you would like to continue with vac therapy she will need another prescription for therapy.  She needs length of need and signature and date on the form.  Her call back is [phone] ext [phone] if you have any questions

## 2019-12-09 ENCOUNTER — Telehealth: Payer: Self-pay | Admitting: Podiatry

## 2019-12-09 NOTE — Telephone Encounter (Signed)
The wound vac company called on behalf of this pt. They received the renewal prescription for 10/16. However, they never received it for 9/16. They wanted to know if you or someone could fax it to them. Call back # [phone] ext. [phone]. Please advise.

## 2019-12-09 NOTE — Telephone Encounter (Signed)
Hi,   The one you had me signed this morning for this date right?

## 2019-12-21 ENCOUNTER — Institutional Professional Consult (permissible substitution): Payer: Medicaid Other | Admitting: Plastic Surgery

## 2019-12-22 ENCOUNTER — Other Ambulatory Visit: Payer: Self-pay

## 2019-12-22 ENCOUNTER — Ambulatory Visit (INDEPENDENT_AMBULATORY_CARE_PROVIDER_SITE_OTHER): Payer: Medicaid Other | Admitting: Podiatry

## 2019-12-22 DIAGNOSIS — E1165 Type 2 diabetes mellitus with hyperglycemia: Secondary | ICD-10-CM | POA: Diagnosis not present

## 2019-12-22 DIAGNOSIS — L97422 Non-pressure chronic ulcer of left heel and midfoot with fat layer exposed: Secondary | ICD-10-CM

## 2019-12-22 DIAGNOSIS — E08621 Diabetes mellitus due to underlying condition with foot ulcer: Secondary | ICD-10-CM | POA: Diagnosis not present

## 2019-12-22 DIAGNOSIS — IMO0002 Reserved for concepts with insufficient information to code with codable children: Secondary | ICD-10-CM

## 2019-12-22 DIAGNOSIS — E118 Type 2 diabetes mellitus with unspecified complications: Secondary | ICD-10-CM

## 2019-12-23 DIAGNOSIS — I120 Hypertensive chronic kidney disease with stage 5 chronic kidney disease or end stage renal disease: Secondary | ICD-10-CM | POA: Diagnosis not present

## 2019-12-23 DIAGNOSIS — D631 Anemia in chronic kidney disease: Secondary | ICD-10-CM | POA: Diagnosis not present

## 2019-12-23 DIAGNOSIS — N2581 Secondary hyperparathyroidism of renal origin: Secondary | ICD-10-CM | POA: Diagnosis not present

## 2019-12-23 DIAGNOSIS — N185 Chronic kidney disease, stage 5: Secondary | ICD-10-CM | POA: Diagnosis not present

## 2019-12-23 DIAGNOSIS — N189 Chronic kidney disease, unspecified: Secondary | ICD-10-CM | POA: Diagnosis not present

## 2019-12-26 ENCOUNTER — Encounter: Payer: Self-pay | Admitting: Podiatry

## 2019-12-26 NOTE — Progress Notes (Signed)
Subjective:  Patient ID: [name], female    DOB: 1965/08/10,  MRN: [id number]  Chief Complaint  Patient presents with  . Wound Check    2 week wound check    54 y.o. female presents for wound care.  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 Maceration resolved.  We will continue to do Betadine wet-to-dry dressing changes. Patient missed her appointment with plastics.   Review of Systems: Negative except as noted in the HPI. Denies N/V/F/Ch.  Past Medical History:  Diagnosis Date  . Anxiety 2002  . Chest tightness   . Depression 2001  . Diabetes type 1, uncontrolled ([place])    at age 33  . Diabetic neuropathy ([place])   . Essential hypertension 2015  . GERD (gastroesophageal reflux disease)    about age of 28  . Nausea and vomiting in adult   . Stroke ([place])   . Urinary frequency   . Yeast vaginitis     Current Outpatient Medications:  .  acetaminophen-codeine (TYLENOL #3) 300-30 MG tablet, TAKE 1 TABLET EVERY 8 HOURS AS NEEDED FOR MODERATE PAIN (INS WILL ONLY PAY FOR 21), Disp: 21 tablet, Rfl: 1 .  alosetron (LOTRONEX) 0.5 MG tablet, Take 0.5 mg by mouth 2 (two) times daily., Disp: , Rfl: 6 .  aluminum chloride (DRYSOL) 20 % external solution, Apply topically at bedtime., Disp: 35 mL, Rfl: 5 .  amLODipine (NORVASC) 5 MG tablet, Take 1 tablet (5 mg total) by mouth daily., Disp: 30 tablet, Rfl: 5 .  aspirin EC 81 MG EC tablet, Take 1 tablet (81 mg total) by mouth daily., Disp: 30 tablet, Rfl: 0 .  atorvastatin (LIPITOR) 40 MG tablet, TAKE 1 TABLET (40 MG TOTAL) BY MOUTH DAILY AT 6 PM., Disp: 30 tablet, Rfl: 1 .  BELBUCA 150 MCG FILM, Take 1 strip by mouth 2 (two) times daily., Disp: , Rfl:  .  Blood Glucose Monitoring Suppl (ACCU-CHEK AVIVA PLUS) w/Device KIT, CHECK BLOOD SUGARS THREE TIMES A DAY.  DX:250.03, Disp: 1 kit, Rfl: 0 .  calcium citrate-vitamin D 500-400 MG-UNIT chewable tablet, Chew 1 tablet by mouth 2 (two)  times daily., Disp: 180 tablet, Rfl: 1 .  carvedilol (COREG) 6.25 MG tablet, Take 6.25 mg by mouth 2 (two) times daily., Disp: , Rfl:  .  Cholecalciferol (CVS VIT D 5000 HIGH-POTENCY) 125 MCG (5000 UT) capsule, Take 5,000 Units by mouth daily. , Disp: , Rfl:  .  cyclobenzaprine (FLEXERIL) 10 MG tablet, TAKE 1 TABLET AT BEDTIME AS NEEDED FOR MUSCLE SPASM (Patient taking differently: Take 10 mg by mouth at bedtime as needed for muscle spasms. ), Disp: 30 tablet, Rfl: 5 .  DEXILANT 60 MG capsule, Take 60 mg by mouth daily., Disp: , Rfl: 6 .  diphenoxylate-atropine (LOMOTIL) 2.5-0.025 MG tablet, 1 or 2 pills twice daily as needed for loose stools/diarrhea.  Office visit needed, Disp: 60 tablet, Rfl: 0 .  DULoxetine (CYMBALTA) 60 MG capsule, TAKE 1 CAPSULE BY MOUTH EVERY DAY. Please make PCP appointment., Disp: 30 capsule, Rfl: 0 .  fluticasone (FLONASE) 50 MCG/ACT nasal spray, Place 2 sprays into both nostrils daily., Disp: 1 g, Rfl: 0 .  glucose blood (ACCU-CHEK AVIVA PLUS) test strip, USE TO CHECK BLOOD SUGAR BEFORE MEALS AND AT BEDTIME dx code E10.65, Disp: 125 each, Rfl: 6 .  hydrALAZINE (APRESOLINE) 50 MG tablet, TAKE ONE TABLET (50MG) BY MOUTH THREE TIMES DAILY, Disp: 90 tablet, Rfl: 11 .  hydrALAZINE (  APRESOLINE) 50 MG tablet, Take 1 tablet (50 mg total) by mouth 3 (three) times daily., Disp: 90 tablet, Rfl: 5 .  hydrALAZINE (APRESOLINE) 50 MG tablet, TAKE 1 TABLET BY MOUTH THREE TIMES A DAY, Disp: 30 tablet, Rfl: 0 .  insulin aspart (NOVOLOG FLEXPEN) 100 UNIT/ML FlexPen, Inject 10 Units into the skin 3 (three) times daily with meals., Disp: 15 mL, Rfl: 4 .  insulin lispro (HUMALOG KWIKPEN) 100 UNIT/ML KiwkPen, Inject 0.13 mLs (13 Units total) into the skin 3 (three) times daily., Disp: 15 mL, Rfl: 6 .  Insulin Pen Needle (TRUEPLUS PEN NEEDLES) 32G X 4 MM MISC, Use to inject insulin as directed., Disp: 100 each, Rfl: 11 .  LANTUS SOLOSTAR 100 UNIT/ML Solostar Pen, INJECT 50 UNITS SUBCUTANEOUSLY  EVERY DAY, Disp: 15 mL, Rfl: 3 .  oxyCODONE (OXY IR/ROXICODONE) 5 MG immediate release tablet, Take 5 mg by mouth every 6 (six) hours as needed (pain)., Disp: , Rfl:  .  promethazine (PHENERGAN) 12.5 MG tablet, Take 1 tablet (12.5 mg total) by mouth every 8 (eight) hours as needed for nausea or vomiting., Disp: 20 tablet, Rfl: 0 .  topiramate (TOPAMAX) 25 MG tablet, Take 1 tablet (25 mg total) by mouth 2 (two) times daily., Disp: 120 tablet, Rfl: 3 .  zolpidem (AMBIEN) 5 MG tablet, Take 1 tablet (5 mg total) by mouth at bedtime as needed for sleep., Disp: 30 tablet, Rfl: 2  Social History   Tobacco Use  Smoking Status Light Tobacco Smoker  . Packs/day: 0.25  . Years: 22.00  . Pack years: 5.50  . Types: Cigarettes  Smokeless Tobacco Never Used    Allergies  Allergen Reactions  . Lidocaine Itching  . Trazodone Swelling  . Latex Rash   Objective:  There were no vitals filed for this visit. There is no height or weight on file to calculate BMI. Constitutional Well developed. Well nourished.  Vascular Dorsalis pedis pulses palpable bilaterally. Posterior tibial pulses palpable bilaterally. Capillary refill normal to all digits.  No cyanosis or clubbing noted. Pedal hair growth normal.  Neurologic Normal speech. Oriented to person, place, and time. Protective sensation absent  Dermatologic Wound Location: Left Chopart amputation site and wound to the plantar foot Wound Base: Mixed Granular/Fibrotic Peri-wound: Calloused Exudate: Scant/small amount Serosanguinous exudate Wound Measurements: -See below  Orthopedic: No pain to palpation either foot.   Radiographs: 3 views of skeletally mature adult left foot: No cortical irregularity or destruction noted.  No concern for osteomyelitis noted. Assessment:   1. 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 Diabetes mellitus type 2, uncontrolled, with  complications ([place])    Plan:  Patient was evaluated and treated and all questions answered.  Ulcer left Chopart amputation site plantar foot wound -Debridement as below. -Dressed with betadine wet to dry dressings  -Continue new knee scooter and has been nonweightbearing to the left lower extremity. -Patient is a high risk of losing the leg if the wound worsens or the infection creeps up.  I discussed this with the patient that he is she is a high risk of losing the leg given the amputation that was already done previously  Patient states understanding. -I explained to the patient the etiology of soft tissue biopsy and various treatment options were discussed.  This will allow [place] to reevaluate and reassess the bacterial growth that is growing in there.  If there is a concern for any  malignancy or any chronic nature to the wound will contact the agency to try to rest the information.  I discussed this with the patient.  Patient states understanding. -It appears that the wound has reached another point of stagnation.  I believe at this time patient may benefit from plastic surgery evaluation for possible flap to cover the wound site.  Referral to plastic surgery sent in.  Patient agrees with this plan. -Punch biopsy was negative for any type of malignant process and did not fail to reveal any type of pathological process.  Procedure: Excisional Debridement of Wound~stagnant Tool: Sharp chisel blade/tissue nipper Rationale: Removal of non-viable soft tissue from the wound to promote healing.  Anesthesia: none Pre-Debridement Wound Measurements: 1.1 cm x 1.0 cm x 0.3 cm Post-Debridement Wound Measurements: 1.2 cm x 1.1 cm x 0.5 cm Type of Debridement: Sharp Excisional Tissue Removed: Non-viable soft tissue Blood loss: Minimal (<50cc) Depth of Debridement: Muscle Technique: Sharp excisional debridement to bleeding, viable wound base.  Wound Progress: The wound has become more stagnant and is not  decreasing in size. Dressing: Dry, sterile, compression dressing. Disposition: Patient tolerated procedure well. Patient to return in 1 week for follow-up.  No follow-ups on file.

## 2019-12-28 ENCOUNTER — Inpatient Hospital Stay (HOSPITAL_COMMUNITY)
Admission: EM | Admit: 2019-12-28 | Discharge: 2020-01-23 | DRG: 474 | Disposition: A | Discharge status: Rehabilitation Facility | Payer: Medicaid Other | Attending: Internal Medicine | Admitting: Internal Medicine

## 2019-12-28 ENCOUNTER — Emergency Department (HOSPITAL_COMMUNITY): Payer: Medicaid Other

## 2019-12-28 ENCOUNTER — Other Ambulatory Visit: Payer: Self-pay

## 2019-12-28 ENCOUNTER — Encounter (HOSPITAL_COMMUNITY): Payer: Self-pay | Admitting: Emergency Medicine

## 2019-12-28 DIAGNOSIS — E1065 Type 1 diabetes mellitus with hyperglycemia: Secondary | ICD-10-CM | POA: Diagnosis not present

## 2019-12-28 DIAGNOSIS — S72142A Displaced intertrochanteric fracture of left femur, initial encounter for closed fracture: Principal | ICD-10-CM

## 2019-12-28 DIAGNOSIS — T148XXA Other injury of unspecified body region, initial encounter: Secondary | ICD-10-CM | POA: Diagnosis not present

## 2019-12-28 DIAGNOSIS — Z91040 Latex allergy status: Secondary | ICD-10-CM

## 2019-12-28 DIAGNOSIS — Z794 Long term (current) use of insulin: Secondary | ICD-10-CM

## 2019-12-28 DIAGNOSIS — E871 Hypo-osmolality and hyponatremia: Secondary | ICD-10-CM

## 2019-12-28 DIAGNOSIS — W010XXA Fall on same level from slipping, tripping and stumbling without subsequent striking against object, initial encounter: Secondary | ICD-10-CM | POA: Diagnosis present

## 2019-12-28 DIAGNOSIS — Z8673 Personal history of transient ischemic attack (TIA), and cerebral infarction without residual deficits: Secondary | ICD-10-CM

## 2019-12-28 DIAGNOSIS — E669 Obesity, unspecified: Secondary | ICD-10-CM | POA: Diagnosis present

## 2019-12-28 DIAGNOSIS — Z884 Allergy status to anesthetic agent status: Secondary | ICD-10-CM

## 2019-12-28 DIAGNOSIS — M8610 Other acute osteomyelitis, unspecified site: Secondary | ICD-10-CM | POA: Diagnosis not present

## 2019-12-28 DIAGNOSIS — L089 Local infection of the skin and subcutaneous tissue, unspecified: Secondary | ICD-10-CM | POA: Diagnosis present

## 2019-12-28 DIAGNOSIS — Z419 Encounter for procedure for purposes other than remedying health state, unspecified: Secondary | ICD-10-CM

## 2019-12-28 DIAGNOSIS — K5900 Constipation, unspecified: Secondary | ICD-10-CM | POA: Diagnosis not present

## 2019-12-28 DIAGNOSIS — E1129 Type 2 diabetes mellitus with other diabetic kidney complication: Secondary | ICD-10-CM | POA: Diagnosis present

## 2019-12-28 DIAGNOSIS — Z4781 Encounter for orthopedic aftercare following surgical amputation: Secondary | ICD-10-CM | POA: Diagnosis not present

## 2019-12-28 DIAGNOSIS — Z95828 Presence of other vascular implants and grafts: Secondary | ICD-10-CM

## 2019-12-28 DIAGNOSIS — M25552 Pain in left hip: Secondary | ICD-10-CM

## 2019-12-28 DIAGNOSIS — M8618 Other acute osteomyelitis, other site: Secondary | ICD-10-CM | POA: Diagnosis not present

## 2019-12-28 DIAGNOSIS — E1151 Type 2 diabetes mellitus with diabetic peripheral angiopathy without gangrene: Secondary | ICD-10-CM | POA: Diagnosis not present

## 2019-12-28 DIAGNOSIS — I10 Essential (primary) hypertension: Secondary | ICD-10-CM | POA: Diagnosis not present

## 2019-12-28 DIAGNOSIS — N186 End stage renal disease: Secondary | ICD-10-CM | POA: Diagnosis not present

## 2019-12-28 DIAGNOSIS — L0291 Cutaneous abscess, unspecified: Secondary | ICD-10-CM | POA: Diagnosis not present

## 2019-12-28 DIAGNOSIS — K219 Gastro-esophageal reflux disease without esophagitis: Secondary | ICD-10-CM | POA: Diagnosis present

## 2019-12-28 DIAGNOSIS — W19XXXA Unspecified fall, initial encounter: Secondary | ICD-10-CM

## 2019-12-28 DIAGNOSIS — Z79891 Long term (current) use of opiate analgesic: Secondary | ICD-10-CM

## 2019-12-28 DIAGNOSIS — R52 Pain, unspecified: Secondary | ICD-10-CM | POA: Diagnosis not present

## 2019-12-28 DIAGNOSIS — R079 Chest pain, unspecified: Secondary | ICD-10-CM | POA: Diagnosis not present

## 2019-12-28 DIAGNOSIS — Z20822 Contact with and (suspected) exposure to covid-19: Secondary | ICD-10-CM | POA: Diagnosis not present

## 2019-12-28 DIAGNOSIS — N179 Acute kidney failure, unspecified: Secondary | ICD-10-CM | POA: Diagnosis present

## 2019-12-28 DIAGNOSIS — IMO0002 Reserved for concepts with insufficient information to code with codable children: Secondary | ICD-10-CM

## 2019-12-28 DIAGNOSIS — E1140 Type 2 diabetes mellitus with diabetic neuropathy, unspecified: Secondary | ICD-10-CM | POA: Diagnosis not present

## 2019-12-28 DIAGNOSIS — Z885 Allergy status to narcotic agent status: Secondary | ICD-10-CM

## 2019-12-28 DIAGNOSIS — E1169 Type 2 diabetes mellitus with other specified complication: Secondary | ICD-10-CM | POA: Diagnosis not present

## 2019-12-28 DIAGNOSIS — L97329 Non-pressure chronic ulcer of left ankle with unspecified severity: Secondary | ICD-10-CM | POA: Diagnosis present

## 2019-12-28 DIAGNOSIS — Z89512 Acquired absence of left leg below knee: Secondary | ICD-10-CM | POA: Diagnosis not present

## 2019-12-28 DIAGNOSIS — F419 Anxiety disorder, unspecified: Secondary | ICD-10-CM | POA: Diagnosis not present

## 2019-12-28 DIAGNOSIS — L97524 Non-pressure chronic ulcer of other part of left foot with necrosis of bone: Secondary | ICD-10-CM | POA: Diagnosis present

## 2019-12-28 DIAGNOSIS — G8918 Other acute postprocedural pain: Secondary | ICD-10-CM | POA: Diagnosis not present

## 2019-12-28 DIAGNOSIS — S72002A Fracture of unspecified part of neck of left femur, initial encounter for closed fracture: Secondary | ICD-10-CM | POA: Diagnosis not present

## 2019-12-28 DIAGNOSIS — R7881 Bacteremia: Secondary | ICD-10-CM | POA: Diagnosis not present

## 2019-12-28 DIAGNOSIS — Z992 Dependence on renal dialysis: Secondary | ICD-10-CM | POA: Diagnosis not present

## 2019-12-28 DIAGNOSIS — Z7982 Long term (current) use of aspirin: Secondary | ICD-10-CM

## 2019-12-28 DIAGNOSIS — D72829 Elevated white blood cell count, unspecified: Secondary | ICD-10-CM | POA: Diagnosis not present

## 2019-12-28 DIAGNOSIS — M868X9 Other osteomyelitis, unspecified sites: Secondary | ICD-10-CM | POA: Diagnosis not present

## 2019-12-28 DIAGNOSIS — E872 Acidosis: Secondary | ICD-10-CM | POA: Diagnosis not present

## 2019-12-28 DIAGNOSIS — M00852 Arthritis due to other bacteria, left hip: Secondary | ICD-10-CM | POA: Diagnosis not present

## 2019-12-28 DIAGNOSIS — D649 Anemia, unspecified: Secondary | ICD-10-CM

## 2019-12-28 DIAGNOSIS — E11621 Type 2 diabetes mellitus with foot ulcer: Secondary | ICD-10-CM | POA: Diagnosis present

## 2019-12-28 DIAGNOSIS — T879 Unspecified complications of amputation stump: Secondary | ICD-10-CM

## 2019-12-28 DIAGNOSIS — Z7401 Bed confinement status: Secondary | ICD-10-CM

## 2019-12-28 DIAGNOSIS — T8141XA Infection following a procedure, superficial incisional surgical site, initial encounter: Secondary | ICD-10-CM | POA: Diagnosis not present

## 2019-12-28 DIAGNOSIS — Z713 Dietary counseling and surveillance: Secondary | ICD-10-CM

## 2019-12-28 DIAGNOSIS — Z789 Other specified health status: Secondary | ICD-10-CM

## 2019-12-28 DIAGNOSIS — E162 Hypoglycemia, unspecified: Secondary | ICD-10-CM | POA: Diagnosis not present

## 2019-12-28 DIAGNOSIS — F32A Depression, unspecified: Secondary | ICD-10-CM | POA: Diagnosis not present

## 2019-12-28 DIAGNOSIS — E11628 Type 2 diabetes mellitus with other skin complications: Secondary | ICD-10-CM | POA: Diagnosis not present

## 2019-12-28 DIAGNOSIS — Y835 Amputation of limb(s) as the cause of abnormal reaction of the patient, or of later complication, without mention of misadventure at the time of the procedure: Secondary | ICD-10-CM | POA: Diagnosis present

## 2019-12-28 DIAGNOSIS — S88112A Complete traumatic amputation at level between knee and ankle, left lower leg, initial encounter: Secondary | ICD-10-CM | POA: Diagnosis not present

## 2019-12-28 DIAGNOSIS — E8770 Fluid overload, unspecified: Secondary | ICD-10-CM | POA: Diagnosis present

## 2019-12-28 DIAGNOSIS — D631 Anemia in chronic kidney disease: Secondary | ICD-10-CM | POA: Diagnosis present

## 2019-12-28 DIAGNOSIS — E1142 Type 2 diabetes mellitus with diabetic polyneuropathy: Secondary | ICD-10-CM | POA: Diagnosis present

## 2019-12-28 DIAGNOSIS — R0902 Hypoxemia: Secondary | ICD-10-CM | POA: Diagnosis not present

## 2019-12-28 DIAGNOSIS — B9689 Other specified bacterial agents as the cause of diseases classified elsewhere: Secondary | ICD-10-CM | POA: Diagnosis not present

## 2019-12-28 DIAGNOSIS — Z833 Family history of diabetes mellitus: Secondary | ICD-10-CM

## 2019-12-28 DIAGNOSIS — N185 Chronic kidney disease, stage 5: Secondary | ICD-10-CM | POA: Diagnosis not present

## 2019-12-28 DIAGNOSIS — E118 Type 2 diabetes mellitus with unspecified complications: Secondary | ICD-10-CM

## 2019-12-28 DIAGNOSIS — Z6830 Body mass index (BMI) 30.0-30.9, adult: Secondary | ICD-10-CM

## 2019-12-28 DIAGNOSIS — Z9049 Acquired absence of other specified parts of digestive tract: Secondary | ICD-10-CM

## 2019-12-28 DIAGNOSIS — M86672 Other chronic osteomyelitis, left ankle and foot: Secondary | ICD-10-CM | POA: Diagnosis not present

## 2019-12-28 DIAGNOSIS — E1122 Type 2 diabetes mellitus with diabetic chronic kidney disease: Secondary | ICD-10-CM | POA: Diagnosis not present

## 2019-12-28 DIAGNOSIS — K5909 Other constipation: Secondary | ICD-10-CM | POA: Diagnosis not present

## 2019-12-28 DIAGNOSIS — D62 Acute posthemorrhagic anemia: Secondary | ICD-10-CM | POA: Diagnosis not present

## 2019-12-28 DIAGNOSIS — L03116 Cellulitis of left lower limb: Secondary | ICD-10-CM

## 2019-12-28 DIAGNOSIS — L02416 Cutaneous abscess of left lower limb: Secondary | ICD-10-CM | POA: Diagnosis not present

## 2019-12-28 DIAGNOSIS — T8789 Other complications of amputation stump: Secondary | ICD-10-CM | POA: Diagnosis not present

## 2019-12-28 DIAGNOSIS — L7682 Other postprocedural complications of skin and subcutaneous tissue: Secondary | ICD-10-CM | POA: Diagnosis not present

## 2019-12-28 DIAGNOSIS — E1121 Type 2 diabetes mellitus with diabetic nephropathy: Secondary | ICD-10-CM | POA: Diagnosis not present

## 2019-12-28 DIAGNOSIS — K582 Mixed irritable bowel syndrome: Secondary | ICD-10-CM | POA: Diagnosis not present

## 2019-12-28 DIAGNOSIS — E1069 Type 1 diabetes mellitus with other specified complication: Secondary | ICD-10-CM | POA: Diagnosis not present

## 2019-12-28 DIAGNOSIS — I739 Peripheral vascular disease, unspecified: Secondary | ICD-10-CM | POA: Diagnosis not present

## 2019-12-28 DIAGNOSIS — Z89432 Acquired absence of left foot: Secondary | ICD-10-CM

## 2019-12-28 DIAGNOSIS — Z79899 Other long term (current) drug therapy: Secondary | ICD-10-CM

## 2019-12-28 DIAGNOSIS — E1165 Type 2 diabetes mellitus with hyperglycemia: Secondary | ICD-10-CM | POA: Diagnosis present

## 2019-12-28 DIAGNOSIS — G479 Sleep disorder, unspecified: Secondary | ICD-10-CM | POA: Diagnosis not present

## 2019-12-28 DIAGNOSIS — I120 Hypertensive chronic kidney disease with stage 5 chronic kidney disease or end stage renal disease: Secondary | ICD-10-CM | POA: Diagnosis present

## 2019-12-28 DIAGNOSIS — E878 Other disorders of electrolyte and fluid balance, not elsewhere classified: Secondary | ICD-10-CM | POA: Diagnosis present

## 2019-12-28 DIAGNOSIS — S72142D Displaced intertrochanteric fracture of left femur, subsequent encounter for closed fracture with routine healing: Secondary | ICD-10-CM | POA: Diagnosis not present

## 2019-12-28 DIAGNOSIS — E11649 Type 2 diabetes mellitus with hypoglycemia without coma: Secondary | ICD-10-CM | POA: Diagnosis not present

## 2019-12-28 DIAGNOSIS — M86172 Other acute osteomyelitis, left ankle and foot: Secondary | ICD-10-CM | POA: Diagnosis not present

## 2019-12-28 DIAGNOSIS — K580 Irritable bowel syndrome with diarrhea: Secondary | ICD-10-CM | POA: Diagnosis present

## 2019-12-28 DIAGNOSIS — F17210 Nicotine dependence, cigarettes, uncomplicated: Secondary | ICD-10-CM | POA: Diagnosis present

## 2019-12-28 DIAGNOSIS — B952 Enterococcus as the cause of diseases classified elsewhere: Secondary | ICD-10-CM | POA: Diagnosis not present

## 2019-12-28 DIAGNOSIS — L0390 Cellulitis, unspecified: Secondary | ICD-10-CM | POA: Diagnosis not present

## 2019-12-28 DIAGNOSIS — K5903 Drug induced constipation: Secondary | ICD-10-CM | POA: Diagnosis not present

## 2019-12-28 LAB — SURGICAL PCR SCREEN
MRSA, PCR: NEGATIVE
Staphylococcus aureus: POSITIVE — AB

## 2019-12-28 LAB — CBC WITH DIFFERENTIAL/PLATELET
Abs Immature Granulocytes: 0 10*3/uL (ref 0.00–0.07)
Basophils Absolute: 0.4 10*3/uL — ABNORMAL HIGH (ref 0.0–0.1)
Basophils Relative: 2 %
Eosinophils Absolute: 0.5 10*3/uL (ref 0.0–0.5)
Eosinophils Relative: 3 %
HCT: 20.5 % — ABNORMAL LOW (ref 36.0–46.0)
Hemoglobin: 6.3 g/dL — CL (ref 12.0–15.0)
Lymphocytes Relative: 4 %
Lymphs Abs: 0.7 10*3/uL (ref 0.7–4.0)
MCH: 26.9 pg (ref 26.0–34.0)
MCHC: 30.7 g/dL (ref 30.0–36.0)
MCV: 87.6 fL (ref 80.0–100.0)
Monocytes Absolute: 0.7 10*3/uL (ref 0.1–1.0)
Monocytes Relative: 4 %
Neutro Abs: 15.3 10*3/uL — ABNORMAL HIGH (ref 1.7–7.7)
Neutrophils Relative %: 87 %
Platelets: 403 10*3/uL — ABNORMAL HIGH (ref 150–400)
RBC: 2.34 MIL/uL — ABNORMAL LOW (ref 3.87–5.11)
RDW: 14.6 % (ref 11.5–15.5)
WBC: 17.6 10*3/uL — ABNORMAL HIGH (ref 4.0–10.5)
nRBC: 0 % (ref 0.0–0.2)

## 2019-12-28 LAB — PREPARE RBC (CROSSMATCH)

## 2019-12-28 LAB — BASIC METABOLIC PANEL
Anion gap: 15 (ref 5–15)
BUN: 67 mg/dL — ABNORMAL HIGH (ref 6–20)
CO2: 16 mmol/L — ABNORMAL LOW (ref 22–32)
Calcium: 7.8 mg/dL — ABNORMAL LOW (ref 8.9–10.3)
Chloride: 103 mmol/L (ref 98–111)
Creatinine, Ser: 6.64 mg/dL — ABNORMAL HIGH (ref 0.44–1.00)
GFR, Estimated: 7 mL/min — ABNORMAL LOW (ref >60)
Glucose, Bld: 297 mg/dL — ABNORMAL HIGH (ref 70–99)
Potassium: 4.7 mmol/L (ref 3.5–5.1)
Sodium: 134 mmol/L — ABNORMAL LOW (ref 135–145)

## 2019-12-28 LAB — RESPIRATORY PANEL BY RT PCR (FLU A&B, COVID)
Influenza A by PCR: NEGATIVE
Influenza B by PCR: NEGATIVE
SARS Coronavirus 2 by RT PCR: NEGATIVE

## 2019-12-28 LAB — PROTIME-INR
INR: 1.1 (ref 0.8–1.2)
Prothrombin Time: 14 seconds (ref 11.4–15.2)

## 2019-12-28 LAB — GLUCOSE, CAPILLARY
Glucose-Capillary: 225 mg/dL — ABNORMAL HIGH (ref 70–99)
Glucose-Capillary: 211 mg/dL — ABNORMAL HIGH (ref 70–99)

## 2019-12-28 LAB — ABO/RH: ABO/RH(D): A POS

## 2019-12-28 MED ORDER — INSULIN ASPART 100 UNIT/ML ~~LOC~~ SOLN
0.0000 [IU] | Freq: Three times a day (TID) | SUBCUTANEOUS | Status: DC
  Administered 2019-12-28 – 2019-12-29 (×3): 3 [IU] via SUBCUTANEOUS
  Administered 2019-12-30 (×2): 5 [IU] via SUBCUTANEOUS
  Administered 2019-12-30: 7 [IU] via SUBCUTANEOUS
  Administered 2019-12-31 (×3): 2 [IU] via SUBCUTANEOUS
  Administered 2020-01-01: 3 [IU] via SUBCUTANEOUS
  Administered 2020-01-01: 1 [IU] via SUBCUTANEOUS
  Administered 2020-01-02 (×3): 2 [IU] via SUBCUTANEOUS
  Administered 2020-01-03: 1 [IU] via SUBCUTANEOUS
  Administered 2020-01-03: 5 [IU] via SUBCUTANEOUS
  Administered 2020-01-03 – 2020-01-04 (×2): 3 [IU] via SUBCUTANEOUS
  Administered 2020-01-04: 5 [IU] via SUBCUTANEOUS
  Administered 2020-01-04: 3 [IU] via SUBCUTANEOUS
  Administered 2020-01-05: 7 [IU] via SUBCUTANEOUS
  Administered 2020-01-05: 3 [IU] via SUBCUTANEOUS
  Administered 2020-01-05: 5 [IU] via SUBCUTANEOUS
  Administered 2020-01-06 (×2): 2 [IU] via SUBCUTANEOUS
  Administered 2020-01-06 – 2020-01-07 (×2): 3 [IU] via SUBCUTANEOUS
  Administered 2020-01-08: 5 [IU] via SUBCUTANEOUS
  Administered 2020-01-08: 2 [IU] via SUBCUTANEOUS
  Administered 2020-01-09 (×2): 3 [IU] via SUBCUTANEOUS
  Administered 2020-01-10: 2 [IU] via SUBCUTANEOUS
  Administered 2020-01-10: 5 [IU] via SUBCUTANEOUS
  Administered 2020-01-11: 2 [IU] via SUBCUTANEOUS
  Administered 2020-01-11 – 2020-01-12 (×3): 3 [IU] via SUBCUTANEOUS
  Administered 2020-01-13: 1 [IU] via SUBCUTANEOUS
  Administered 2020-01-13: 3 [IU] via SUBCUTANEOUS
  Administered 2020-01-14: 2 [IU] via SUBCUTANEOUS
  Administered 2020-01-14 (×2): 1 [IU] via SUBCUTANEOUS
  Administered 2020-01-15: 2 [IU] via SUBCUTANEOUS
  Administered 2020-01-15: 1 [IU] via SUBCUTANEOUS
  Administered 2020-01-15 – 2020-01-16 (×2): 2 [IU] via SUBCUTANEOUS
  Administered 2020-01-16: 1 [IU] via SUBCUTANEOUS
  Administered 2020-01-17: 3 [IU] via SUBCUTANEOUS
  Administered 2020-01-17: 2 [IU] via SUBCUTANEOUS
  Administered 2020-01-17 – 2020-01-18 (×2): 3 [IU] via SUBCUTANEOUS
  Administered 2020-01-20: 5 [IU] via SUBCUTANEOUS
  Administered 2020-01-20: 3 [IU] via SUBCUTANEOUS
  Administered 2020-01-21 – 2020-01-22 (×2): 1 [IU] via SUBCUTANEOUS

## 2019-12-28 MED ORDER — HYDRALAZINE HCL 50 MG PO TABS
50.0000 mg | ORAL_TABLET | Freq: Three times a day (TID) | ORAL | Status: DC
  Administered 2019-12-28 – 2019-12-30 (×6): 50 mg via ORAL
  Filled 2019-12-28: qty 1
  Filled 2019-12-28: qty 2
  Filled 2019-12-28 (×2): qty 1
  Filled 2019-12-28: qty 2
  Filled 2019-12-28 (×2): qty 1

## 2019-12-28 MED ORDER — CARVEDILOL 6.25 MG PO TABS
6.2500 mg | ORAL_TABLET | Freq: Two times a day (BID) | ORAL | Status: DC
  Administered 2019-12-28 – 2019-12-30 (×4): 6.25 mg via ORAL
  Filled 2019-12-28: qty 2
  Filled 2019-12-28 (×2): qty 1
  Filled 2019-12-28: qty 2

## 2019-12-28 MED ORDER — INSULIN GLARGINE 100 UNIT/ML ~~LOC~~ SOLN
18.0000 [IU] | Freq: Every day | SUBCUTANEOUS | Status: DC
  Administered 2019-12-28 – 2019-12-29 (×2): 18 [IU] via SUBCUTANEOUS
  Filled 2019-12-28 (×2): qty 0.18

## 2019-12-28 MED ORDER — ONDANSETRON HCL 4 MG/2ML IJ SOLN
4.0000 mg | Freq: Once | INTRAMUSCULAR | Status: AC
  Administered 2019-12-28: 4 mg via INTRAVENOUS
  Filled 2019-12-28: qty 2

## 2019-12-28 MED ORDER — SODIUM CHLORIDE 0.9% IV SOLUTION: Freq: Once | INTRAVENOUS | Status: AC

## 2019-12-28 MED ORDER — DULOXETINE HCL 60 MG PO CPEP
60.0000 mg | ORAL_CAPSULE | Freq: Every day | ORAL | Status: DC
  Administered 2019-12-28 – 2020-01-23 (×25): 60 mg via ORAL
  Filled 2019-12-28 (×2): qty 1
  Filled 2019-12-28: qty 2
  Filled 2019-12-28 (×22): qty 1

## 2019-12-28 MED ORDER — ALOSETRON HCL 1 MG PO TABS
1.0000 mg | ORAL_TABLET | Freq: Two times a day (BID) | ORAL | Status: DC
  Administered 2019-12-30 – 2020-01-05 (×13): 1 mg via ORAL

## 2019-12-28 MED ORDER — FUROSEMIDE 10 MG/ML IJ SOLN
40.0000 mg | Freq: Once | INTRAMUSCULAR | Status: AC
  Administered 2019-12-28: 40 mg via INTRAVENOUS
  Filled 2019-12-28: qty 4

## 2019-12-28 MED ORDER — HYDROMORPHONE HCL 1 MG/ML IJ SOLN
1.0000 mg | INTRAMUSCULAR | Status: AC | PRN
  Administered 2019-12-28 (×2): 1 mg via INTRAVENOUS
  Filled 2019-12-28 (×2): qty 1

## 2019-12-28 MED ORDER — CYCLOBENZAPRINE HCL 10 MG PO TABS
10.0000 mg | ORAL_TABLET | Freq: Two times a day (BID) | ORAL | Status: DC | PRN
  Administered 2019-12-28 – 2020-01-23 (×22): 10 mg via ORAL
  Filled 2019-12-28 (×23): qty 1

## 2019-12-28 MED ORDER — INSULIN ASPART 100 UNIT/ML ~~LOC~~ SOLN
3.0000 [IU] | Freq: Three times a day (TID) | SUBCUTANEOUS | Status: DC
  Administered 2019-12-28 – 2019-12-29 (×2): 3 [IU] via SUBCUTANEOUS

## 2019-12-28 MED ORDER — HYDROCODONE-ACETAMINOPHEN 5-325 MG PO TABS
1.0000 | ORAL_TABLET | Freq: Four times a day (QID) | ORAL | Status: DC | PRN
  Administered 2019-12-28 – 2020-01-01 (×10): 2 via ORAL
  Administered 2020-01-01: 1 via ORAL
  Administered 2020-01-02 (×2): 2 via ORAL
  Administered 2020-01-02: 1 via ORAL
  Administered 2020-01-02: 2 via ORAL
  Administered 2020-01-03: 1 via ORAL
  Administered 2020-01-03 – 2020-01-05 (×7): 2 via ORAL
  Administered 2020-01-05: 1 via ORAL
  Administered 2020-01-05: 2 via ORAL
  Administered 2020-01-05: 1 via ORAL
  Administered 2020-01-06 – 2020-01-07 (×3): 2 via ORAL
  Administered 2020-01-07: 1 via ORAL
  Administered 2020-01-07 – 2020-01-08 (×4): 2 via ORAL
  Administered 2020-01-08: 1 via ORAL
  Administered 2020-01-09 (×2): 2 via ORAL
  Filled 2019-12-28 (×7): qty 2
  Filled 2019-12-28: qty 1
  Filled 2019-12-28 (×12): qty 2
  Filled 2019-12-28: qty 1
  Filled 2019-12-28 (×2): qty 2
  Filled 2019-12-28 (×3): qty 1
  Filled 2019-12-28 (×6): qty 2
  Filled 2019-12-28: qty 1
  Filled 2019-12-28 (×7): qty 2

## 2019-12-28 MED ORDER — ATORVASTATIN CALCIUM 40 MG PO TABS
40.0000 mg | ORAL_TABLET | Freq: Every day | ORAL | Status: DC
  Administered 2019-12-28 – 2020-01-22 (×25): 40 mg via ORAL
  Filled 2019-12-28 (×26): qty 1

## 2019-12-28 MED ORDER — HYDROMORPHONE HCL 1 MG/ML IJ SOLN
1.0000 mg | Freq: Once | INTRAMUSCULAR | Status: AC
  Administered 2019-12-28: 1 mg via INTRAVENOUS
  Filled 2019-12-28: qty 1

## 2019-12-28 MED ORDER — MORPHINE SULFATE (PF) 2 MG/ML IV SOLN
0.5000 mg | INTRAVENOUS | Status: DC | PRN
  Administered 2019-12-28 – 2020-01-11 (×25): 0.5 mg via INTRAVENOUS
  Filled 2019-12-28 (×26): qty 1

## 2019-12-28 MED ORDER — AMLODIPINE BESYLATE 5 MG PO TABS
5.0000 mg | ORAL_TABLET | Freq: Every day | ORAL | Status: DC
  Administered 2019-12-28: 5 mg via ORAL
  Filled 2019-12-28: qty 1

## 2019-12-28 MED ORDER — ENSURE PRE-SURGERY PO LIQD
296.0000 mL | Freq: Once | ORAL | Status: AC
  Administered 2019-12-29: 296 mL via ORAL
  Filled 2019-12-28: qty 296

## 2019-12-28 MED ORDER — SENNOSIDES-DOCUSATE SODIUM 8.6-50 MG PO TABS: 1.0000 | ORAL_TABLET | Freq: Every evening | ORAL | Status: DC | PRN

## 2019-12-28 MED ORDER — PANTOPRAZOLE SODIUM 40 MG PO TBEC
40.0000 mg | DELAYED_RELEASE_TABLET | Freq: Every day | ORAL | Status: DC
  Administered 2019-12-30 – 2020-01-04 (×6): 40 mg via ORAL
  Filled 2019-12-28 (×6): qty 1

## 2019-12-28 NOTE — Progress Notes (Signed)
Upon inpatient admission pt had wound to left foot which she states occurred as a result of "poor fitting prosthetic".  The wound is circular in shape measuring 2cm x 2cm x 2.5cm with tunneling present. Pt states is being followed by [facility].  Wound was cleansed and re-dressed per pt's current treatment regimen of NS cleansing, betadine, cover with gauze, wrapped with kerlix & ace wrap. Arham Symmonds, Laurel Dimmer, RN

## 2019-12-28 NOTE — ED Notes (Addendum)
Date and time results received: 12/28/19 12:23 PM   Test: HGB Critical Value: 6.3  Name of Provider Notified: EDP [place]  Orders Received? Or Actions Taken?: See orders

## 2019-12-28 NOTE — Progress Notes (Signed)
Orthopedic Tech Progress Note Patient Details:  [name] [birth year] [id number]  Ortho Devices Ortho Device/Splint Location: trapeze bar Ortho Device/Splint Interventions: Application, Adjustment   Post Interventions Patient Tolerated: Well Instructions Provided: Care of device, Adjustment of device   [name] 12/28/2019, 4:11 PM

## 2019-12-28 NOTE — H&P (Addendum)
History and Physical        Hospital Admission Note Date: 12/28/2019  Patient name: [name] Medical record number: [id number] Date of birth: February 17, 1966 Age: 54 y.o. Gender: female  PCP: Julian Hy, PA-C  Patient coming from: home  At baseline, ambulates: scooter  Chief Complaint    Chief Complaint  Patient presents with  . 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 Has had some difficulty with ambulating due to her left foot amputation.  Did not hit her head, no LOC, no other complaints.   ED Course: Afebrile, hypertensive on room air.  Notable labs: Na 134, CO2 16, glucose 297, BUN 67, creatinine 6.64 (was 4.181-year ago), WBC 17.6, Hb 6.3. In the ED 2 u PRBCs ordered.  Left hip x-ray with intertrochanteric fracture of the proximal left femur with avulsion of lesser trochanter. She received opiates in the ED for pain.  Vitals:   12/28/19 1708 12/28/19 1804  BP: (!) 181/72 (!) 162/66  Pulse:  78  Resp:  17  Temp:  98.2 F (36.8 C)  SpO2:  97%     Review of Systems:  Review of Systems  Constitutional: Negative for chills and fever.  Respiratory: Negative for shortness of breath.   Cardiovascular: Negative for chest pain.  Gastrointestinal: Negative for nausea and vomiting.  Genitourinary: Negative.        Able to make urine  Musculoskeletal: Positive for falls and joint pain.  All other systems reviewed and are negative.   Medical/Social/Family History   Past Medical History: Past Medical  History:  Diagnosis Date  . Anxiety 2002  . Chest tightness   . Depression 2001  . Diabetes type 1, uncontrolled ([employer])    at age 64  . Diabetic neuropathy ([place])   . Essential hypertension 2015  . GERD (gastroesophageal reflux disease)    about age of 63  . Nausea and vomiting in adult   . Stroke ([place])   . Urinary frequency   . Yeast vaginitis     Past Surgical History:  Procedure Laterality Date  . ACHILLES TENDON SURGERY Left 03/10/2013   Procedure: LEFT CHOPART AMPUTATION/ LEFT TENDON ACHILLES [place];  Surgeon: Wylene Simmer, MD;  Location: [place];  Service: Orthopedics;  Laterality: Left;  . AMPUTATION Left 06/15/2012   Procedure: AMPUTATION DIGIT;  Surgeon: Meredith Pel, MD;  Location: [place];  Service: Orthopedics;  Laterality: Left;  Left great toe revision amputation  . ENDOMETRIAL ABLATION    . IR ANGIOGRAM EXTREMITY LEFT  12/17/2018  . IR FEM POP ART ATHERECT INC PTA MOD SED  12/17/2018  . IR RADIOLOGIST EVAL & MGMT  11/30/2018  . IR RADIOLOGIST EVAL & MGMT  12/09/2018  . IR RADIOLOGIST EVAL & MGMT  02/08/2019  . IR US GUIDE VASC ACCESS RIGHT  12/17/2018  .  LAPAROSCOPIC CHOLECYSTECTOMY  01/30/2015   Cone day surgery     Medications: Prior to Admission medications   Medication Sig Start Date End Date Taking? Authorizing Provider  acetaminophen (TYLENOL) 500 MG tablet Take 1,000 mg by mouth every 6 (six) hours as needed for mild pain, fever or headache.   Yes [provider]  alosetron (LOTRONEX) 1 MG tablet Take 1 mg by mouth 2 (two) times daily. 12/19/19  Yes [provider]  amLODipine (NORVASC) 5 MG tablet Take 1 tablet (5 mg total) by mouth daily. 10/31/18  Yes Fulp, Cammie, MD  aspirin EC 81 MG EC tablet Take 1 tablet (81 mg total) by mouth daily. 04/11/17  Yes Domenic Polite, MD  atorvastatin (LIPITOR) 40 MG tablet TAKE 1 TABLET (40 MG TOTAL) BY MOUTH DAILY AT 6 PM. 02/22/19  Yes Fulp, Cammie, MD  carvedilol (COREG) 6.25 MG tablet Take 6.25 mg by  mouth 2 (two) times daily. 09/03/19  Yes [provider]  CINNAMON PO Take 1 tablet by mouth daily.   Yes [provider]  cyclobenzaprine (FLEXERIL) 10 MG tablet TAKE 1 TABLET AT BEDTIME AS NEEDED FOR MUSCLE SPASM Patient taking differently: Take 10 mg by mouth at bedtime as needed for muscle spasms.  11/16/18  Yes Fulp, Cammie, MD  DEXILANT 60 MG capsule Take 60 mg by mouth daily. 06/09/17  Yes [provider]  DULoxetine (CYMBALTA) 60 MG capsule TAKE 1 CAPSULE BY MOUTH EVERY DAY. Please make PCP appointment. Patient taking differently: Take 60 mg by mouth daily.  03/23/19  Yes Fulp, Cammie, MD  hydrALAZINE (APRESOLINE) 50 MG tablet TAKE 1 TABLET BY MOUTH THREE TIMES A DAY Patient taking differently: Take 50 mg by mouth 3 (three) times daily.  09/13/19  Yes Fulp, Cammie, MD  insulin aspart (NOVOLOG FLEXPEN) 100 UNIT/ML FlexPen Inject 10 Units into the skin 3 (three) times daily with meals. 01/04/19  Yes Fulp, Cammie, MD  LANTUS SOLOSTAR 100 UNIT/ML Solostar Pen INJECT [place] DAY Patient taking differently: Inject 20 Units into the skin daily.  01/31/19  Yes Fulp, Cammie, MD  MAGNESIUM PO Take 1 tablet by mouth daily.   Yes [provider]  Oxycodone HCl 10 MG TABS Take 10 mg by mouth 3 (three) times daily as needed (pain).  11/17/19  Yes [provider]  Blood Glucose Monitoring Suppl (ACCU-CHEK AVIVA PLUS) w/Device KIT CHECK BLOOD SUGARS THREE TIMES A DAY.  DX:250.03 10/06/17   Fulp, Cammie, MD  calcium citrate-vitamin D 500-400 MG-UNIT chewable tablet Chew 1 tablet by mouth 2 (two) times daily. Patient not taking: Reported on 12/28/2019 05/09/14   Boykin Nearing, MD  glucose blood (ACCU-CHEK AVIVA PLUS) test strip USE TO CHECK BLOOD SUGAR BEFORE MEALS AND AT BEDTIME dx code E10.65 10/06/17   Fulp, Cammie, MD  Insulin Pen Needle (TRUEPLUS PEN NEEDLES) 32G X 4 MM MISC Use to inject insulin as directed. 12/01/18   Antony Blackbird, MD     Allergies:   Allergies  Allergen Reactions  . Lidocaine Itching  . Trazodone Swelling  . Latex Rash    Social History:  reports that she has been smoking cigarettes. She has a 5.50 pack-year smoking history. She has never used smokeless tobacco. She reports current alcohol use. She reports that she does not use drugs.  Family History: Family History  Problem Relation Age of Onset  . Diabetes Mother   . Heart attack Mother   . Heart disease Mother  before age 45  . Hypertension Mother   . Hyperlipidemia Mother   . Diabetes Father   . Heart attack Father   . Heart disease Father        before age 65  . Diabetes Sister   . Hypertension Sister      Objective   Physical Exam: Blood pressure (!) 162/66, pulse 78, temperature 98.2 F (36.8 C), temperature source Oral, resp. rate 17, height 5' 10" (1.778 m), weight 88.6 kg, SpO2 97 %.  Physical Exam Vitals and nursing note reviewed.  Constitutional:      Appearance: Normal appearance.  HENT:     Head: Normocephalic and atraumatic.  Eyes:     Conjunctiva/sclera: Conjunctivae normal.  Cardiovascular:     Rate and Rhythm: Normal rate and regular rhythm.  Pulmonary:     Effort: Pulmonary effort is normal.     Breath sounds: Normal breath sounds.  Abdominal:     General: Abdomen is flat.     Palpations: Abdomen is soft.  Musculoskeletal:     Comments: 1+ bilateral lower extremity edema Left leg externally rotated Left foot s/p amputation  Skin:    Coloration: Skin is not jaundiced or pale.  Neurological:     Mental Status: She is alert. Mental status is at baseline.  Psychiatric:        Mood and Affect: Mood normal.        Behavior: Behavior normal.     LABS on Admission: I have personally reviewed all the labs and imaging below    Basic Metabolic Panel: Recent Labs  Lab 12/28/19 1200  NA 134*  K 4.7  CL 103  CO2 16*  GLUCOSE 297*  BUN 67*  CREATININE 6.64*  CALCIUM 7.8*   Liver Function  Tests: No results for input(s): AST, ALT, ALKPHOS, BILITOT, PROT, ALBUMIN in the last 168 hours. No results for input(s): LIPASE, AMYLASE in the last 168 hours. No results for input(s): AMMONIA in the last 168 hours. CBC: Recent Labs  Lab 12/28/19 1200  WBC 17.6*  NEUTROABS 15.3*  HGB 6.3*  HCT 20.5*  MCV 87.6  PLT 403*   Cardiac Enzymes: No results for input(s): CKTOTAL, CKMB, CKMBINDEX, TROPONINI in the last 168 hours. BNP: Invalid input(s): POCBNP CBG: Recent Labs  Lab 12/28/19 1644  GLUCAP 225*    Radiological Exams on Admission:  DG Chest Port 1 View  Result Date: 12/28/2019 CLINICAL DATA:  Pain following fall EXAM: PORTABLE CHEST 1 VIEW COMPARISON:  April 07, 2017 FINDINGS: The lungs are clear. Heart is upper normal in size with pulmonary vascularity normal. No adenopathy. No pneumothorax. No bone lesions. IMPRESSION: Lungs clear.  Heart upper normal in size. Electronically Signed   By: Lowella Grip III M.D.   On: 12/28/2019 13:19   DG Hip Unilat W or Wo Pelvis 2-3 Views Left  Result Date: 12/28/2019 CLINICAL DATA:  Fall from scooter. EXAM: DG HIP (WITH OR WITHOUT PELVIS) 2-3V LEFT COMPARISON:  None. FINDINGS: Intertrochanteric fracture through the proximal LEFT femur. Avulsion of the inferior trochanter. Hip is located. IMPRESSION: Intertrochanteric fracture proximal LEFT femur with avulsion of the lesser trochanter Electronically Signed   By: Suzy Bouchard M.D.   On: 12/28/2019 11:50      EKG: Independently reviewed.    A & P   Principal Problem:   Fracture Active Problems:   Diabetes mellitus type 2, uncontrolled, with complications (HCC)   CKD (chronic kidney disease) stage 5, GFR less than 15 ml/min (HCC)  AKI (acute kidney injury) ([place])   Volume overload   Acute on chronic anemia   1. Intertrochanteric fracture of the proximal left femur with avulsion of the lesser trochanter s/p mechanical fall from standing height a. Plan for surgery  tomorrow per Ortho pending medical optimization as below  2. Acute on chronic anemia of CKD a. No signs of bleeding and does not appear to be bleeding into her hip b. Hb 6.3, dropped from 8.7 in April c. 2 units PRBCs ordered by Ortho, follow-up H&H this evening, Lasix 40 mg IV.  Transfusion per nephrology  3. AKI on CKD 5 a. Not on dialysis yet b. Follows with vascular surgery outpatient and plan was for possibly starting sometime soon c. Nephrology consulted, discussed with Dr. Jonnie Finner  4. Volume overloaded a. Bilateral lower extremity edema b. Can consider diuresis after her hip fracture surgery  5. Diabetes a. Continue with reduced dose basal bolus insulin b. Carb modified diet c. Follow-up HbA1c  6. Hypertension a. Continue home hydralazine and amlodipine and carvedilol   DVT prophylaxis: SCDs   Code Status: Full Code  Diet: Carb modified, n.p.o. after midnight Family Communication: Admission, patients condition and plan of care including tests being ordered have been discussed with the patient who indicates understanding and agrees with the plan and Code Status.  Disposition Plan: The appropriate patient status for this patient is INPATIENT. Inpatient status is judged to be reasonable and necessary in order to provide the required intensity of service to ensure the patient's safety. The patient's presenting symptoms, physical exam findings, and initial radiographic and laboratory data in the context of their chronic comorbidities is felt to place them at high risk for further clinical deterioration. Furthermore, it is not anticipated that the patient will be medically stable for discharge from the hospital within 2 midnights of admission. The following factors support the patient status of inpatient.   " The patient's presenting symptoms include fall, left hip pain. " The worrisome physical exam findings include lower extremity edema. " The initial radiographic and laboratory  data are worrisome because of left hip fracture, anemia, AKI. " The chronic co-morbidities include CKD 5.   * I certify that at the point of admission it is my clinical judgment that the patient will require inpatient hospital care spanning beyond 2 midnights from the point of admission due to high intensity of service, high risk for further deterioration and high frequency of surveillance required.*   Status is: Inpatient  Remains inpatient appropriate because:Ongoing active pain requiring inpatient pain management, IV treatments appropriate due to intensity of illness or inability to take PO and Inpatient level of care appropriate due to severity of illness   Dispo: The patient is from: Home              Anticipated d/c is to: SNF              Anticipated d/c date is: > 3 days              Patient currently is not medically stable to d/c.         The medical decision making on this patient was of high complexity and the patient is at high risk for clinical deterioration, therefore this is a level 3  admission.  Consultants  . Nephrology . Ortho  Procedures  . 2 units PRBCs  Time Spent on Admission: 72 minutes    Harold Hedge, DO [facility]  12/28/2019, 6:27 PM

## 2019-12-28 NOTE — Consult Note (Signed)
Renal Service Consult Note [facility]  [name] 12/28/2019 Sol Blazing, MD Requesting Physician: Dr Neysa Bonito   Reason for Consult:  CKD 5 pateint w/ hip fracture HPI: The patient is a 54 y.o. year-old w/ hx of DM2, HTN, CKD V f/b Dr Posey Pronto, hx CVA, neuropathy, depression.  Pt has had L foot Chopart amputation 6 yrs ago, was walking in the grass and fell onto concrete and broke the L hip.  Admitted and seen by orthopedics, plan is for 2u prbc's d/t anemia, and then possibly hip surgery tomorrow if stable. Asked to see for renal failure.   Pt seen in room, dtr present. Pt denies any SOB, n/v/d, no abd pain, no serious fatigue, loss of appetite, insomnia or confusion/ somnolence/ lethargy.    Only new things is her legs are swelling for the last 2 weeks, does not take lasix or other diuretics.   ROS  denies CP  no joint pain   no HA  no blurry vision  no rash  no diarrhea  no nausea/ vomiting  no dysuria  no difficulty voiding  no change in urine color    Past Medical History  Past Medical History:  Diagnosis Date  . Anxiety 2002  . Chest tightness   . Depression 2001  . Diabetes type 1, uncontrolled ([place])    at age 38  . Diabetic neuropathy ([place])   . Essential hypertension 2015  . GERD (gastroesophageal reflux disease)    about age of 50  . Nausea and vomiting in adult   . Stroke ([place])   . Urinary frequency   . Yeast vaginitis    Past Surgical History  Past Surgical History:  Procedure Laterality Date  . ACHILLES TENDON SURGERY Left 03/10/2013   Procedure: LEFT CHOPART AMPUTATION/ LEFT TENDON ACHILLES [place];  Surgeon: Wylene Simmer, MD;  Location: [place];  Service: Orthopedics;  Laterality: Left;  . AMPUTATION Left 06/15/2012   Procedure: AMPUTATION DIGIT;  Surgeon: Meredith Pel, MD;  Location: [place];  Service: Orthopedics;  Laterality: Left;  Left great toe revision amputation  . ENDOMETRIAL ABLATION    . IR ANGIOGRAM EXTREMITY LEFT   12/17/2018  . IR FEM POP ART ATHERECT INC PTA MOD SED  12/17/2018  . IR RADIOLOGIST EVAL & MGMT  11/30/2018  . IR RADIOLOGIST EVAL & MGMT  12/09/2018  . IR RADIOLOGIST EVAL & MGMT  02/08/2019  . IR US GUIDE VASC ACCESS RIGHT  12/17/2018  . LAPAROSCOPIC CHOLECYSTECTOMY  01/30/2015   Cone day surgery    Family History  Family History  Problem Relation Age of Onset  . Diabetes Mother   . Heart attack Mother   . Heart disease Mother        before age 52  . Hypertension Mother   . Hyperlipidemia Mother   . Diabetes Father   . Heart attack Father   . Heart disease Father        before age 44  . Diabetes Sister   . Hypertension Sister    Social History  reports that she has been smoking cigarettes. She has a 5.50 pack-year smoking history. She has never used smokeless tobacco. She reports current alcohol use. She reports that she does not use drugs. Allergies  Allergies  Allergen Reactions  . Lidocaine Itching  . Trazodone Swelling  . Latex Rash   Home medications Prior to Admission medications   Medication Sig Start Date End Date Taking? Authorizing Provider  acetaminophen (TYLENOL)  500 MG tablet Take 1,000 mg by mouth every 6 (six) hours as needed for mild pain, fever or headache.   Yes [provider]  alosetron (LOTRONEX) 1 MG tablet Take 1 mg by mouth 2 (two) times daily. 12/19/19  Yes [provider]  amLODipine (NORVASC) 5 MG tablet Take 1 tablet (5 mg total) by mouth daily. 10/31/18  Yes Fulp, Cammie, MD  aspirin EC 81 MG EC tablet Take 1 tablet (81 mg total) by mouth daily. 04/11/17  Yes Domenic Polite, MD  atorvastatin (LIPITOR) 40 MG tablet TAKE 1 TABLET (40 MG TOTAL) BY MOUTH DAILY AT 6 PM. 02/22/19  Yes Fulp, Cammie, MD  carvedilol (COREG) 6.25 MG tablet Take 6.25 mg by mouth 2 (two) times daily. 09/03/19  Yes [provider]  CINNAMON PO Take 1 tablet by mouth daily.   Yes [provider]  cyclobenzaprine (FLEXERIL) 10 MG tablet TAKE 1  TABLET AT BEDTIME AS NEEDED FOR MUSCLE SPASM Patient taking differently: Take 10 mg by mouth at bedtime as needed for muscle spasms.  11/16/18  Yes Fulp, Cammie, MD  DEXILANT 60 MG capsule Take 60 mg by mouth daily. 06/09/17  Yes [provider]  DULoxetine (CYMBALTA) 60 MG capsule TAKE 1 CAPSULE BY MOUTH EVERY DAY. Please make PCP appointment. Patient taking differently: Take 60 mg by mouth daily.  03/23/19  Yes Fulp, Cammie, MD  hydrALAZINE (APRESOLINE) 50 MG tablet TAKE 1 TABLET BY MOUTH THREE TIMES A DAY Patient taking differently: Take 50 mg by mouth 3 (three) times daily.  09/13/19  Yes Fulp, Cammie, MD  insulin aspart (NOVOLOG FLEXPEN) 100 UNIT/ML FlexPen Inject 10 Units into the skin 3 (three) times daily with meals. 01/04/19  Yes Fulp, Cammie, MD  LANTUS SOLOSTAR 100 UNIT/ML Solostar Pen INJECT [place] DAY Patient taking differently: Inject 20 Units into the skin daily.  01/31/19  Yes Fulp, Cammie, MD  MAGNESIUM PO Take 1 tablet by mouth daily.   Yes [provider]  Oxycodone HCl 10 MG TABS Take 10 mg by mouth 3 (three) times daily as needed (pain).  11/17/19  Yes [provider]  Blood Glucose Monitoring Suppl (ACCU-CHEK AVIVA PLUS) w/Device KIT CHECK BLOOD SUGARS THREE TIMES A DAY.  DX:250.03 10/06/17   Fulp, Cammie, MD  calcium citrate-vitamin D 500-400 MG-UNIT chewable tablet Chew 1 tablet by mouth 2 (two) times daily. Patient not taking: Reported on 12/28/2019 05/09/14   Boykin Nearing, MD  glucose blood (ACCU-CHEK AVIVA PLUS) test strip USE TO CHECK BLOOD SUGAR BEFORE MEALS AND AT BEDTIME dx code E10.65 10/06/17   Fulp, Cammie, MD  Insulin Pen Needle (TRUEPLUS PEN NEEDLES) 32G X 4 MM MISC Use to inject insulin as directed. 12/01/18   Fulp, Cammie, MD     Vitals:   12/28/19 1200 12/28/19 1230 12/28/19 1345 12/28/19 1526  BP: (!) 163/72 (!) 177/75 (!) 175/66 (!) 170/66  Pulse: 79 81 81 76  Resp: '16 17 17 16  ' Temp:    98.5 F (36.9 C)   TempSrc:    Oral  SpO2: 100% 98% 99% 96%  Weight:    88.6 kg  Height:    '5\' 10"'  (1.778 m)   Exam Gen alert, no distress No rash, cyanosis or gangrene Sclera anicteric, throat clear  No jvd or bruits Chest clear bilat to bases, no rales RRR no MRG Abd soft ntnd no mass or ascites +bs GU defer MS no joint effusions or deformity Ext 1-2+ pitting pretib  and some 1+hip edema Neuro is alert, Ox 3 , nf    Home meds:  - norvasc 5/ coreg 6.25/ asa / lipitor / hydralazine 50 tid  - insulin lantus 50u/ aspart 10u tid  - oxy IR prn/ cymbalta qd  - dexilant/ flexeril prn/ protonix 40     CXR - no acute disease    Na 134  K 4.7  CO2 16  BUN 67  Cr 6.64  Ca 7.8  Hb 6.3 WBC 17K       Last creat's >>      Aug 2021 - creat 4.9      Dec 23, 2019 - creat 5.2        Assessment/ Plan: 1. AoCKD 5, not on dialysis - b/l creat is 4.9- 5.2, f/b Dr Posey Pronto. Creat up in 6 range here.  New LE edema per the patient last couple of weeks.  CXR neg for edema, no resp issues. No signs of uremia. Pt stable for orthopedic surgery.  2. Volume - new LE edema, sig but not severe. Consider diuresis perhaps after the hip fracture surgery.   3. Fall/ L hip fracture - ortho seeing, plan surgery soon 4. HTN - BP's up a bit, okay for home BP meds x 3 5. DM - on insulin, per [facility] 6. Anemia ckd - got prbc's x 2 this afternoon. Will give IV lasix 77m x 1 peri- transfusion.       RKelly Splinter MD 12/28/2019, 4:09 PM  Recent Labs  Lab 12/28/19 1200  WBC 17.6*  HGB 6.3*   Recent Labs  Lab 12/28/19 1200  K 4.7  BUN 67*  CREATININE 6.64*  CALCIUM 7.8*

## 2019-12-28 NOTE — ED Triage Notes (Signed)
BIBA Per EMS: Pt coming from home [name] yesterday on L hip. States she hit the concrete on that side. Came in today for worsening pain.  Hx of diabetes  L amputated foot.  250 mcg fentanyl  22 L hand  200/75 86 HR 20 RR 96% room air 381 CBG 98.3 temp
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
## 2019-12-28 NOTE — Plan of Care (Signed)
  Problem: Pain Managment: Goal: General experience of comfort will improve Outcome: Progressing   Problem: Education: Goal: Knowledge of General Education information will improve Description Including pain rating scale, medication(s)/side effects and non-pharmacologic comfort measures Outcome: Completed/Met   

## 2019-12-29 ENCOUNTER — Inpatient Hospital Stay (HOSPITAL_COMMUNITY): Payer: Medicaid Other

## 2019-12-29 ENCOUNTER — Encounter (HOSPITAL_COMMUNITY): Payer: Self-pay | Admitting: Internal Medicine

## 2019-12-29 ENCOUNTER — Inpatient Hospital Stay (HOSPITAL_COMMUNITY): Payer: Medicaid Other | Admitting: Certified Registered"

## 2019-12-29 ENCOUNTER — Encounter (HOSPITAL_COMMUNITY)
Admission: EM | Disposition: A | Discharge status: Rehabilitation Facility | Payer: Self-pay | Source: Home / Self Care | Attending: Internal Medicine

## 2019-12-29 DIAGNOSIS — E1122 Type 2 diabetes mellitus with diabetic chronic kidney disease: Secondary | ICD-10-CM | POA: Diagnosis not present

## 2019-12-29 DIAGNOSIS — L97329 Non-pressure chronic ulcer of left ankle with unspecified severity: Secondary | ICD-10-CM | POA: Diagnosis not present

## 2019-12-29 DIAGNOSIS — N186 End stage renal disease: Secondary | ICD-10-CM | POA: Diagnosis not present

## 2019-12-29 DIAGNOSIS — W19XXXA Unspecified fall, initial encounter: Secondary | ICD-10-CM | POA: Diagnosis not present

## 2019-12-29 DIAGNOSIS — L02416 Cutaneous abscess of left lower limb: Secondary | ICD-10-CM | POA: Diagnosis not present

## 2019-12-29 DIAGNOSIS — S72142A Displaced intertrochanteric fracture of left femur, initial encounter for closed fracture: Secondary | ICD-10-CM | POA: Diagnosis not present

## 2019-12-29 DIAGNOSIS — E1165 Type 2 diabetes mellitus with hyperglycemia: Secondary | ICD-10-CM | POA: Diagnosis not present

## 2019-12-29 DIAGNOSIS — D649 Anemia, unspecified: Secondary | ICD-10-CM | POA: Diagnosis not present

## 2019-12-29 DIAGNOSIS — I120 Hypertensive chronic kidney disease with stage 5 chronic kidney disease or end stage renal disease: Secondary | ICD-10-CM | POA: Diagnosis not present

## 2019-12-29 DIAGNOSIS — D631 Anemia in chronic kidney disease: Secondary | ICD-10-CM | POA: Diagnosis not present

## 2019-12-29 DIAGNOSIS — Z20822 Contact with and (suspected) exposure to covid-19: Secondary | ICD-10-CM | POA: Diagnosis not present

## 2019-12-29 DIAGNOSIS — E118 Type 2 diabetes mellitus with unspecified complications: Secondary | ICD-10-CM | POA: Diagnosis not present

## 2019-12-29 DIAGNOSIS — D62 Acute posthemorrhagic anemia: Secondary | ICD-10-CM | POA: Diagnosis not present

## 2019-12-29 DIAGNOSIS — E1140 Type 2 diabetes mellitus with diabetic neuropathy, unspecified: Secondary | ICD-10-CM | POA: Diagnosis not present

## 2019-12-29 DIAGNOSIS — E871 Hypo-osmolality and hyponatremia: Secondary | ICD-10-CM | POA: Diagnosis not present

## 2019-12-29 DIAGNOSIS — T148XXA Other injury of unspecified body region, initial encounter: Secondary | ICD-10-CM | POA: Diagnosis not present

## 2019-12-29 DIAGNOSIS — M86672 Other chronic osteomyelitis, left ankle and foot: Secondary | ICD-10-CM | POA: Diagnosis not present

## 2019-12-29 DIAGNOSIS — N185 Chronic kidney disease, stage 5: Secondary | ICD-10-CM | POA: Diagnosis not present

## 2019-12-29 DIAGNOSIS — E872 Acidosis: Secondary | ICD-10-CM | POA: Diagnosis not present

## 2019-12-29 DIAGNOSIS — N179 Acute kidney failure, unspecified: Secondary | ICD-10-CM | POA: Diagnosis not present

## 2019-12-29 DIAGNOSIS — L03116 Cellulitis of left lower limb: Secondary | ICD-10-CM | POA: Diagnosis not present

## 2019-12-29 HISTORY — PX: INTRAMEDULLARY (IM) NAIL INTERTROCHANTERIC: SHX5875

## 2019-12-29 LAB — CBC
HCT: 26.9 % — ABNORMAL LOW (ref 36.0–46.0)
Hemoglobin: 8.6 g/dL — ABNORMAL LOW (ref 12.0–15.0)
MCH: 28.1 pg (ref 26.0–34.0)
MCHC: 32 g/dL (ref 30.0–36.0)
MCV: 87.9 fL (ref 80.0–100.0)
Platelets: 399 10*3/uL (ref 150–400)
RBC: 3.06 MIL/uL — ABNORMAL LOW (ref 3.87–5.11)
RDW: 14.7 % (ref 11.5–15.5)
WBC: 26.1 10*3/uL — ABNORMAL HIGH (ref 4.0–10.5)
nRBC: 0 % (ref 0.0–0.2)

## 2019-12-29 LAB — GLUCOSE, CAPILLARY
Glucose-Capillary: 243 mg/dL — ABNORMAL HIGH (ref 70–99)
Glucose-Capillary: 169 mg/dL — ABNORMAL HIGH (ref 70–99)
Glucose-Capillary: 159 mg/dL — ABNORMAL HIGH (ref 70–99)
Glucose-Capillary: 192 mg/dL — ABNORMAL HIGH (ref 70–99)
Glucose-Capillary: 216 mg/dL — ABNORMAL HIGH (ref 70–99)
Glucose-Capillary: 249 mg/dL — ABNORMAL HIGH (ref 70–99)

## 2019-12-29 LAB — HIV ANTIBODY (ROUTINE TESTING W REFLEX): HIV Screen 4th Generation wRfx: NONREACTIVE

## 2019-12-29 LAB — HEMOGLOBIN AND HEMATOCRIT, BLOOD
HCT: 26.1 % — ABNORMAL LOW (ref 36.0–46.0)
Hemoglobin: 8.5 g/dL — ABNORMAL LOW (ref 12.0–15.0)

## 2019-12-29 LAB — CREATININE, SERUM
Creatinine, Ser: 6.19 mg/dL — ABNORMAL HIGH (ref 0.44–1.00)
GFR, Estimated: 8 mL/min — ABNORMAL LOW (ref >60)

## 2019-12-29 SURGERY — FIXATION, FRACTURE, INTERTROCHANTERIC, WITH INTRAMEDULLARY ROD
Anesthesia: General | Site: Hip | Laterality: Left

## 2019-12-29 MED ORDER — MIDAZOLAM HCL 2 MG/2ML IJ SOLN
INTRAMUSCULAR | Status: AC
  Filled 2019-12-29: qty 2

## 2019-12-29 MED ORDER — METOCLOPRAMIDE HCL 5 MG PO TABS: 5.0000 mg | ORAL_TABLET | Freq: Three times a day (TID) | ORAL | Status: DC | PRN

## 2019-12-29 MED ORDER — ACETAMINOPHEN 10 MG/ML IV SOLN: 1000.0000 mg | Freq: Once | INTRAVENOUS | Status: DC | PRN

## 2019-12-29 MED ORDER — METOCLOPRAMIDE HCL 5 MG/ML IJ SOLN: 5.0000 mg | Freq: Three times a day (TID) | INTRAMUSCULAR | Status: DC | PRN

## 2019-12-29 MED ORDER — FENTANYL CITRATE (PF) 100 MCG/2ML IJ SOLN
INTRAMUSCULAR | Status: AC
  Filled 2019-12-29: qty 2

## 2019-12-29 MED ORDER — PHENYLEPHRINE HCL (PRESSORS) 10 MG/ML IV SOLN
INTRAVENOUS | Status: AC
  Filled 2019-12-29: qty 1

## 2019-12-29 MED ORDER — MENTHOL 3 MG MT LOZG: 1.0000 | LOZENGE | OROMUCOSAL | Status: DC | PRN

## 2019-12-29 MED ORDER — FENTANYL CITRATE (PF) 100 MCG/2ML IJ SOLN
INTRAMUSCULAR | Status: DC | PRN
  Administered 2019-12-29 (×6): 50 ug via INTRAVENOUS

## 2019-12-29 MED ORDER — ENOXAPARIN SODIUM 40 MG/0.4ML ~~LOC~~ SOLN
40.0000 mg | SUBCUTANEOUS | Status: DC
  Administered 2019-12-30: 40 mg via SUBCUTANEOUS
  Filled 2019-12-29: qty 0.4

## 2019-12-29 MED ORDER — DEXAMETHASONE SODIUM PHOSPHATE 10 MG/ML IJ SOLN
INTRAMUSCULAR | Status: AC
  Filled 2019-12-29: qty 1

## 2019-12-29 MED ORDER — PHENYLEPHRINE 40 MCG/ML (10ML) SYRINGE FOR IV PUSH (FOR BLOOD PRESSURE SUPPORT)
PREFILLED_SYRINGE | INTRAVENOUS | Status: AC
  Filled 2019-12-29: qty 10

## 2019-12-29 MED ORDER — LACTATED RINGERS IV SOLN: INTRAVENOUS | Status: DC | PRN

## 2019-12-29 MED ORDER — ACETAMINOPHEN 10 MG/ML IV SOLN
INTRAVENOUS | Status: DC | PRN
  Administered 2019-12-29: 1000 mg via INTRAVENOUS

## 2019-12-29 MED ORDER — CEFAZOLIN SODIUM-DEXTROSE 2-4 GM/100ML-% IV SOLN
2.0000 g | INTRAVENOUS | Status: AC
  Administered 2019-12-29: 2 g via INTRAVENOUS
  Filled 2019-12-29 (×2): qty 100

## 2019-12-29 MED ORDER — ROCURONIUM BROMIDE 10 MG/ML (PF) SYRINGE
PREFILLED_SYRINGE | INTRAVENOUS | Status: DC | PRN
  Administered 2019-12-29: 80 mg via INTRAVENOUS

## 2019-12-29 MED ORDER — ONDANSETRON HCL 4 MG/2ML IJ SOLN
INTRAMUSCULAR | Status: AC
  Filled 2019-12-29: qty 2

## 2019-12-29 MED ORDER — ONDANSETRON HCL 4 MG/2ML IJ SOLN
INTRAMUSCULAR | Status: DC | PRN
  Administered 2019-12-29: 4 mg via INTRAVENOUS

## 2019-12-29 MED ORDER — DEXAMETHASONE SODIUM PHOSPHATE 10 MG/ML IJ SOLN
INTRAMUSCULAR | Status: DC | PRN
  Administered 2019-12-29: 8 mg via INTRAVENOUS

## 2019-12-29 MED ORDER — PHENOL 1.4 % MT LIQD: 1.0000 | OROMUCOSAL | Status: DC | PRN

## 2019-12-29 MED ORDER — OXYCODONE HCL 5 MG/5ML PO SOLN: 5.0000 mg | Freq: Once | ORAL | Status: DC | PRN

## 2019-12-29 MED ORDER — SODIUM CHLORIDE (PF) 0.9 % IJ SOLN
INTRAMUSCULAR | Status: AC
  Filled 2019-12-29: qty 10

## 2019-12-29 MED ORDER — ALBUMIN HUMAN 5 % IV SOLN
INTRAVENOUS | Status: AC
  Filled 2019-12-29: qty 250

## 2019-12-29 MED ORDER — DOCUSATE SODIUM 100 MG PO CAPS
100.0000 mg | ORAL_CAPSULE | Freq: Two times a day (BID) | ORAL | Status: DC
  Administered 2019-12-29 – 2020-01-10 (×4): 100 mg via ORAL
  Filled 2019-12-29 (×23): qty 1

## 2019-12-29 MED ORDER — ONDANSETRON HCL 4 MG/2ML IJ SOLN: 4.0000 mg | Freq: Once | INTRAMUSCULAR | Status: DC | PRN

## 2019-12-29 MED ORDER — ISOPROPYL ALCOHOL 70 % SOLN
Status: AC
  Filled 2019-12-29: qty 480

## 2019-12-29 MED ORDER — MUPIROCIN 2 % EX OINT
1.0000 "application " | TOPICAL_OINTMENT | Freq: Two times a day (BID) | CUTANEOUS | Status: AC
  Administered 2019-12-29 – 2020-01-02 (×10): 1 via NASAL
  Filled 2019-12-29 (×2): qty 22

## 2019-12-29 MED ORDER — FENTANYL CITRATE (PF) 250 MCG/5ML IJ SOLN
INTRAMUSCULAR | Status: AC
  Filled 2019-12-29: qty 5

## 2019-12-29 MED ORDER — CHLORHEXIDINE GLUCONATE CLOTH 2 % EX PADS
6.0000 | MEDICATED_PAD | Freq: Every day | CUTANEOUS | Status: DC
  Administered 2019-12-29 – 2020-01-05 (×5): 6 via TOPICAL

## 2019-12-29 MED ORDER — ROCURONIUM BROMIDE 10 MG/ML (PF) SYRINGE
PREFILLED_SYRINGE | INTRAVENOUS | Status: AC
  Filled 2019-12-29: qty 10

## 2019-12-29 MED ORDER — CHLORHEXIDINE GLUCONATE CLOTH 2 % EX PADS
6.0000 | MEDICATED_PAD | Freq: Every day | CUTANEOUS | Status: DC
  Administered 2019-12-29: 6 via TOPICAL

## 2019-12-29 MED ORDER — ACETAMINOPHEN 10 MG/ML IV SOLN
INTRAVENOUS | Status: AC
  Filled 2019-12-29: qty 100

## 2019-12-29 MED ORDER — PROPOFOL 10 MG/ML IV BOLUS
INTRAVENOUS | Status: AC
  Filled 2019-12-29: qty 20

## 2019-12-29 MED ORDER — PROSOURCE PLUS PO LIQD
30.0000 mL | Freq: Three times a day (TID) | ORAL | Status: DC
  Administered 2019-12-29 – 2020-01-10 (×17): 30 mL via ORAL
  Filled 2019-12-29 (×23): qty 30

## 2019-12-29 MED ORDER — AMISULPRIDE (ANTIEMETIC) 5 MG/2ML IV SOLN: 10.0000 mg | Freq: Once | INTRAVENOUS | Status: DC | PRN

## 2019-12-29 MED ORDER — SODIUM CHLORIDE 0.9 % IV SOLN
INTRAVENOUS | Status: DC | PRN
  Administered 2019-12-29 – 2020-01-08 (×4): 250 mL via INTRAVENOUS

## 2019-12-29 MED ORDER — HYDROMORPHONE HCL 1 MG/ML IJ SOLN: 0.2500 mg | INTRAMUSCULAR | Status: DC | PRN

## 2019-12-29 MED ORDER — HYDRALAZINE HCL 20 MG/ML IJ SOLN
INTRAMUSCULAR | Status: DC | PRN
  Administered 2019-12-29: 2 mg via INTRAVENOUS

## 2019-12-29 MED ORDER — PROPOFOL 10 MG/ML IV BOLUS
INTRAVENOUS | Status: DC | PRN
  Administered 2019-12-29: 150 mg via INTRAVENOUS

## 2019-12-29 MED ORDER — ONDANSETRON HCL 4 MG/2ML IJ SOLN
4.0000 mg | Freq: Four times a day (QID) | INTRAMUSCULAR | Status: DC | PRN
  Administered 2020-01-08 – 2020-01-12 (×3): 4 mg via INTRAVENOUS
  Filled 2019-12-29 (×2): qty 2

## 2019-12-29 MED ORDER — HYDROMORPHONE HCL 1 MG/ML IJ SOLN
INTRAMUSCULAR | Status: AC
  Filled 2019-12-29: qty 1

## 2019-12-29 MED ORDER — OXYCODONE HCL 5 MG PO TABS: 5.0000 mg | ORAL_TABLET | Freq: Once | ORAL | Status: DC | PRN

## 2019-12-29 MED ORDER — CEFAZOLIN SODIUM-DEXTROSE 2-4 GM/100ML-% IV SOLN
2.0000 g | Freq: Four times a day (QID) | INTRAVENOUS | Status: AC
  Administered 2019-12-29 – 2019-12-30 (×2): 2 g via INTRAVENOUS
  Filled 2019-12-29 (×2): qty 100

## 2019-12-29 MED ORDER — ALBUMIN HUMAN 5 % IV SOLN: INTRAVENOUS | Status: DC | PRN

## 2019-12-29 MED ORDER — TRANEXAMIC ACID-NACL 1000-0.7 MG/100ML-% IV SOLN
1000.0000 mg | INTRAVENOUS | Status: AC
  Administered 2019-12-29: 1000 mg via INTRAVENOUS
  Filled 2019-12-29 (×2): qty 100

## 2019-12-29 MED ORDER — CHLORHEXIDINE GLUCONATE 4 % EX LIQD: 60.0000 mL | Freq: Once | CUTANEOUS | Status: DC

## 2019-12-29 MED ORDER — MIDAZOLAM HCL 5 MG/5ML IJ SOLN
INTRAMUSCULAR | Status: DC | PRN
  Administered 2019-12-29: 2 mg via INTRAVENOUS

## 2019-12-29 MED ORDER — SUGAMMADEX SODIUM 200 MG/2ML IV SOLN
INTRAVENOUS | Status: DC | PRN
  Administered 2019-12-29: 200 mg via INTRAVENOUS

## 2019-12-29 MED ORDER — SODIUM CHLORIDE 0.9 % IR SOLN
Status: DC | PRN
  Administered 2019-12-29: 1000 mL

## 2019-12-29 MED ORDER — ISOPROPYL ALCOHOL 70 % SOLN
Status: DC | PRN
  Administered 2019-12-29: 1 via TOPICAL

## 2019-12-29 MED ORDER — EPHEDRINE 5 MG/ML INJ
INTRAVENOUS | Status: AC
  Filled 2019-12-29: qty 10

## 2019-12-29 MED ORDER — POVIDONE-IODINE 10 % EX SWAB
2.0000 "application " | Freq: Once | CUTANEOUS | Status: AC
  Administered 2019-12-29: 2 via TOPICAL

## 2019-12-29 MED ORDER — MEPERIDINE HCL 50 MG/ML IJ SOLN: 6.2500 mg | INTRAMUSCULAR | Status: DC | PRN

## 2019-12-29 MED ORDER — RENA-VITE PO TABS
1.0000 | ORAL_TABLET | Freq: Every day | ORAL | Status: DC
  Administered 2019-12-29 – 2020-01-22 (×25): 1 via ORAL
  Filled 2019-12-29 (×25): qty 1

## 2019-12-29 MED ORDER — HYDRALAZINE HCL 20 MG/ML IJ SOLN
INTRAMUSCULAR | Status: AC
  Filled 2019-12-29: qty 1

## 2019-12-29 MED ORDER — ONDANSETRON HCL 4 MG PO TABS: 4.0000 mg | ORAL_TABLET | Freq: Four times a day (QID) | ORAL | Status: DC | PRN

## 2019-12-29 SURGICAL SUPPLY — 49 items
ADH SKN CLS APL DERMABOND .7 (GAUZE/BANDAGES/DRESSINGS) ×2
APL PRP STRL LF DISP 70% ISPRP (MISCELLANEOUS) ×1
BAG SPEC THK2 15X12 ZIP CLS (MISCELLANEOUS)
BAG ZIPLOCK 12X15 (MISCELLANEOUS) IMPLANT
BIT DRILL CANN LG 4.3MM (BIT) IMPLANT
CHLORAPREP W/TINT 26 (MISCELLANEOUS) ×2 IMPLANT
COVER PERINEAL POST (MISCELLANEOUS) ×2 IMPLANT
COVER SURGICAL LIGHT HANDLE (MISCELLANEOUS) ×2 IMPLANT
COVER WAND RF STERILE (DRAPES) IMPLANT
DERMABOND ADVANCED (GAUZE/BANDAGES/DRESSINGS) ×2
DERMABOND ADVANCED .7 DNX12 (GAUZE/BANDAGES/DRESSINGS) ×2 IMPLANT
DRAPE C-ARM 42X120 X-RAY (DRAPES) ×2 IMPLANT
DRAPE C-ARMOR (DRAPES) ×2 IMPLANT
DRAPE IMP U-DRAPE 54X76 (DRAPES) ×4 IMPLANT
DRAPE SHEET LG 3/4 BI-LAMINATE (DRAPES) ×4 IMPLANT
DRAPE STERI IOBAN 125X83 (DRAPES) ×2 IMPLANT
DRAPE U-SHAPE 47X51 STRL (DRAPES) ×4 IMPLANT
DRILL BIT CANN LG 4.3MM (BIT) ×2
DRSG AQUACEL AG ADV 3.5X 6 (GAUZE/BANDAGES/DRESSINGS) ×2 IMPLANT
DRSG MEPILEX BORDER 4X4 (GAUZE/BANDAGES/DRESSINGS) ×4 IMPLANT
DRSG MEPILEX BORDER 4X8 (GAUZE/BANDAGES/DRESSINGS) IMPLANT
ELECT BLADE TIP CTD 4 INCH (ELECTRODE) IMPLANT
FACESHIELD WRAPAROUND (MASK) ×4 IMPLANT
FACESHIELD WRAPAROUND OR TEAM (MASK) ×2 IMPLANT
GAUZE SPONGE 4X4 12PLY STRL (GAUZE/BANDAGES/DRESSINGS) ×2 IMPLANT
GLOVE BIO SURGEON STRL SZ8.5 (GLOVE) ×4 IMPLANT
GLOVE BIOGEL M STRL SZ7.5 (GLOVE) ×4 IMPLANT
GLOVE BIOGEL PI IND STRL 8 (GLOVE) ×1 IMPLANT
GLOVE BIOGEL PI IND STRL 8.5 (GLOVE) ×1 IMPLANT
GLOVE BIOGEL PI INDICATOR 8 (GLOVE) ×1
GLOVE BIOGEL PI INDICATOR 8.5 (GLOVE) ×1
GOWN SPEC L3 XXLG W/TWL (GOWN DISPOSABLE) ×2 IMPLANT
GUIDEPIN 3.2X17.5 THRD DISP (PIN) ×2 IMPLANT
HFN 125 DEG 9MM X 180MM (Nail) ×1 IMPLANT
KIT BASIN OR (CUSTOM PROCEDURE TRAY) ×2 IMPLANT
KIT TURNOVER KIT A (KITS) IMPLANT
MANIFOLD NEPTUNE II (INSTRUMENTS) ×2 IMPLANT
MARKER SKIN DUAL TIP RULER LAB (MISCELLANEOUS) ×2 IMPLANT
PACK GENERAL/GYN (CUSTOM PROCEDURE TRAY) ×2 IMPLANT
PENCIL SMOKE EVACUATOR (MISCELLANEOUS) IMPLANT
SCREW BONE CORTICAL 5.0X36 (Screw) ×1 IMPLANT
SCREW LAG HIP NAIL 10.5X95 (Screw) ×1 IMPLANT
STAPLER VISISTAT 35W (STAPLE) ×1 IMPLANT
SUT MNCRL AB 3-0 PS2 18 (SUTURE) ×2 IMPLANT
SUT MON AB 2-0 CT1 36 (SUTURE) ×1 IMPLANT
SUT VIC AB 1 CT1 36 (SUTURE) ×2 IMPLANT
SYR BULB IRRIG 60ML STRL (SYRINGE) ×1 IMPLANT
TOWEL OR 17X26 10 PK STRL BLUE (TOWEL DISPOSABLE) ×2 IMPLANT
TRAY FOLEY MTR SLVR 16FR STAT (SET/KITS/TRAYS/PACK) ×1 IMPLANT
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
## 2019-12-29 NOTE — Op Note (Signed)
OPERATIVE REPORT  SURGEON: Rod Can, MD   ASSISTANT: Cherlynn June, PA-C.  PREOPERATIVE DIAGNOSIS: Left intertrochanteric femur fracture.   POSTOPERATIVE DIAGNOSIS: Left intertrochanteric femur fracture.   PROCEDURE: Intramedullary fixation, Left femur.   IMPLANTS: Biomet Affixus Hip Fracture Nail, 9 by 180 mm, 125 degrees. 10.5 x 95 mm Hip Fracture Nail Lag Screw. 5 x 36 mm distal interlocking screw 1.  ANESTHESIA:  General  ESTIMATED BLOOD LOSS:-70 mL    ANTIBIOTICS: 2 g Ancef.  DRAINS: None.  COMPLICATIONS: None.   CONDITION: PACU - hemodynamically stable.   BRIEF CLINICAL NOTE: [name] is a 54 y.o. female who presented with an intertrochanteric femur fracture. The patient was admitted to the hospitalist service and underwent perioperative risk stratification and medical optimization. The risks, benefits, and alternatives to the procedure were explained, and the patient elected to proceed.  PROCEDURE IN DETAIL: Surgical site was marked by myself. The patient was taken to the operating room and anesthesia was induced on the bed. The patient was then transferred to the [facility] table and the nonoperative lower extremity was scissored underneath the operative side. The fracture was reduced with traction, internal rotation, and adduction. The hip was prepped and draped in the normal sterile surgical fashion. Timeout was called verifying side and site of surgery. Preop antibiotics were given with 60 minutes of beginning the procedure.  Fluoroscopy was used to define the patient's anatomy. A 4 cm incision was made just proximal to the tip of the greater trochanter. The awl was used to obtain the standard starting point for a trochanteric entry nail under fluoroscopic control. The guidepin was placed. The entry reamer was used to open the proximal femur.  On the back table, the nail was assembled onto the jig. The nail was placed into the femur without any difficulty.  Through a separate stab incision, the cannula was placed down to the bone in preparation for the cephalomedullary device. A guidepin was placed into the femoral head using AP and lateral fluoroscopy views. The pin was measured, and then reaming was performed to the appropriate depth. The lag screw was inserted to the appropriate depth. The fracture was compressed through the jig. The setscrew was tightened and then loosened one quarter turn. A separate stab incision was created, and the distal interlocking screw was placed using standard AO technique. The jig was removed. Final AP and lateral fluoroscopy views were obtained to confirm fracture reduction and hardware placement. Tip apex distance was appropriate. There was no chondral penetration.  The wounds were copiously irrigated with saline. The wound was closed in layers with #1 Vicryl for the fascia, 2-0 Monocryl for the deep dermal layer, and skin staples. Glue was applied to the skin. Once the glue was fully hardened, sterile dressing was applied. The patient was then awakened from anesthesia and taken to the PACU in stable condition. Sponge needle and instrument counts were correct at the end of the case 2. There were no known complications.  We will readmit the patient to the hospitalist. Weightbearing status will be weightbearing as tolerated with a walker. We will begin Lovenox for DVT prophylaxis. The patient will work with physical therapy and undergo disposition planning.  Please note that a surgical assistant was a medical necessity for this procedure to perform it in a safe and expeditious manner. Assistant was necessary to provide appropriate retraction of vital neurovascular structures, to prevent femoral fracture, and to allow for anatomic placement of the prosthesis.

## 2019-12-29 NOTE — Plan of Care (Signed)
Care plan initiated and discussed with the patient. 

## 2019-12-29 NOTE — Consult Note (Signed)
[facility] Nurse Consult Note: Reason for Consult: Consult requested for LLE. Pt previously had an amputation to this location and states her protheses is has rubbed against the site and created a chronic wound.  She is followed as an outpatient by [facility].  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 Dressing procedure/placement/frequency: Topical treatment orders provided for bedside nurses to perform. Continue present plan of care as previously ordered by [facility]: Paint LLE wound Q day with Betadine, then cover with 2X2 and kerlex and ace wrap. Pt can resume follow-up with that group after discharge.  Please re-consult if further assistance is needed.  Thank-you,  Julien Girt MSN, [place], [place], [place], [place]

## 2019-12-29 NOTE — Anesthesia Procedure Notes (Signed)
Procedure Name: Intubation Date/Time: 12/29/2019 1:19 PM Performed by: Silas [place], CRNA Pre-anesthesia Checklist: Patient identified, Emergency Drugs available, Suction available and Patient being monitored Patient Re-evaluated:Patient Re-evaluated prior to induction Oxygen Delivery Method: Circle system utilized Preoxygenation: Pre-oxygenation with 100% oxygen Induction Type: IV induction Ventilation: Mask ventilation without difficulty Laryngoscope Size: Mac and 4 Grade View: Grade I Tube type: Oral Tube size: 7.0 mm Number of attempts: 1 Airway Equipment and Method: Stylet and Oral airway Placement Confirmation: ETT inserted through vocal cords under direct vision,  positive ETCO2 and breath sounds checked- equal and bilateral Secured at: 23 cm Tube secured with: Tape Dental Injury: Teeth and Oropharynx as per pre-operative assessment

## 2019-12-29 NOTE — Interval H&P Note (Signed)
History and Physical Interval Note:  12/29/2019 12:36 PM  [name]  has presented today for surgery, with the diagnosis of LEFT INTERTROCHANTERIC FRACTURE.  The various methods of treatment have been discussed with the patient and family. After consideration of risks, benefits and other options for treatment, the patient has consented to  Procedure(s): INTRAMEDULLARY (IM) NAIL INTERTROCHANTRIC (Left) as a surgical intervention.  The patient's history has been reviewed, patient examined, no change in status, stable for surgery.  I have reviewed the patient's chart and labs.  Questions were answered to the patient's satisfaction.    The risks, benefits, and alternatives were discussed with the patient. There are risks associated with the surgery including, but not limited to, problems with anesthesia (death), infection, differences in leg length/angulation/rotation, fracture of bones, loosening or failure of implants, malunion, nonunion, hematoma (blood accumulation) which may require surgical drainage, blood clots, pulmonary embolism, nerve injury (foot drop), and blood vessel injury. The patient understands these risks and elects to proceed.    Hilton Cork Josefita Weissmann

## 2019-12-29 NOTE — Progress Notes (Signed)
Orthopedic Tech Progress Note Patient Details:  [name] 1965-06-29 [id number] Reapplied overhead frame to bed. Patient ID: [name], female   DOB: February 21, 1966, 54 y.o.   MRN: [id number]   [name] 12/29/2019, 5:05 PM

## 2019-12-29 NOTE — Progress Notes (Signed)
Inpatient Diabetes Program Recommendations  AACE/ADA: New Consensus Statement on Inpatient Glycemic Control (2015)  Target Ranges:  Prepandial:   less than 140 mg/dL      Peak postprandial:   less than 180 mg/dL (1-2 hours)      Critically ill patients:  140 - 180 mg/dL   Lab Results  Component Value Date   GLUCAP 243 (H) 12/29/2019   HGBA1C 6.7 (A) 10/29/2018    Review of Glycemic Control Results for [name], [name] (MRN [id number]) as of 12/29/2019 10:59  Ref. Range 12/28/2019 16:44 12/28/2019 21:32 12/29/2019 07:47  Glucose-Capillary Latest Ref Range: 70 - 99 mg/dL 225 (H) 211 (H) 243 (H)   Diabetes history: DM 2 Outpatient Diabetes medications: Lantus 20 units Daily, Novolog 10 units tid meal coverage Current orders for Inpatient glycemic control: Lantus 18 units, Novolog 0-9 units tid + Novolog 3 units tid meal coverage  Inpatient Diabetes Program Recommendations:    Glucose 243 this am after Lantus 18 units given last night.  -  Increase Lantus to 22 units  Thanks,  Tama Headings RN, MSN, BC-ADM Inpatient Diabetes Coordinator Team Pager [phone] (8a-5p)

## 2019-12-29 NOTE — Anesthesia Preprocedure Evaluation (Addendum)
Anesthesia Evaluation  Patient identified by MRN, date of birth, ID band Patient awake    Reviewed: Allergy & Precautions, NPO status , Patient's Chart, lab work & pertinent test results  Airway Mallampati: II  TM Distance: >3 FB Neck ROM: Full    Dental no notable dental hx. (+) Teeth Intact, Dental Advisory Given   Pulmonary Current Smoker and Patient abstained from smoking.,    Pulmonary exam normal breath sounds clear to auscultation       Cardiovascular hypertension, Pt. on medications Normal cardiovascular exam Rhythm:Regular Rate:Normal  EKG SR R 80  04/2017 Echo Left ventricle: The cavity size was normal. Wall thickness was  increased in a pattern of moderate LVH. Systolic function was  normal. The estimated ejection fraction was in the range of 60%  to 65%. Wall motion was normal; there were no regional wall  motion abnormalities. Features are consistent with a pseudonormal  left ventricular filling pattern, with concomitant abnormal  relaxation and increased filling pressure (grade 2 diastolic  dysfunction).    Neuro/Psych  Headaches, PSYCHIATRIC DISORDERS Anxiety CVA    GI/Hepatic GERD  ,  Endo/Other  diabetes, Poorly Controlled, Type 1  Renal/GU CRFRenal diseaseK+ 4.7 Cr 6.64  CRF Stage V     Musculoskeletal   Abdominal   Peds  Hematology  (+) anemia , Hgb 8.5  Received 2 U PRBC   Anesthesia Other Findings   Reproductive/Obstetrics negative OB ROS                           Anesthesia Physical Anesthesia Plan  ASA: III and emergent  Anesthesia Plan: General   Post-op Pain Management:    Induction: Intravenous  PONV Risk Score and Plan: 2 and Treatment may vary due to age or medical condition, Ondansetron and Midazolam  Airway Management Planned: Oral ETT  Additional Equipment: None  Intra-op Plan:   Post-operative Plan: Extubation in  OR  Informed Consent: I have reviewed the patients History and Physical, chart, labs and discussed the procedure including the risks, benefits and alternatives for the proposed anesthesia with the patient or authorized representative who has indicated his/her understanding and acceptance.     Dental advisory given  Plan Discussed with: CRNA and Anesthesiologist  Anesthesia Plan Comments: (GA w ETT)        Anesthesia Quick Evaluation

## 2019-12-29 NOTE — Transfer of Care (Signed)
Immediate Anesthesia Transfer of Care Note  Patient: [name]  Procedure(s) Performed: INTRAMEDULLARY (IM) NAIL INTERTROCHANTRIC (Left Hip)  Patient Location: PACU  Anesthesia Type:General  Level of Consciousness: awake, oriented, patient cooperative and obtunded  Airway & Oxygen Therapy: Patient Spontanous Breathing and Patient connected to face mask oxygen  Post-op Assessment: Report given to RN and Post -op Vital signs reviewed and stable  Post vital signs: Reviewed and stable  Last Vitals:  Vitals Value Taken Time  BP 139/56 12/29/19 1510  Temp    Pulse 67 12/29/19 1513  Resp 22 12/29/19 1513  SpO2 100 % 12/29/19 1513  Vitals shown include unvalidated device data.  Last Pain:  Vitals:   12/29/19 1147  TempSrc:   PainSc: 7       Patients Stated Pain Goal: 4 (04/59/13 6859)  Complications: No complications documented.

## 2019-12-29 NOTE — Progress Notes (Signed)
Initial Nutrition Assessment  RD working remotely.  DOCUMENTATION CODES:   Not applicable  INTERVENTION:  - diet advancement as medically feasible. - will order 30 ml Prosource Plus TID, each supplement provides 100 kcal and 15 grams protein.  - will order 1 tablet rena-vite/day. - will complete NFPE at follow-up.   NUTRITION DIAGNOSIS:   Increased nutrient needs related to post-op healing as evidenced by estimated needs.  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 She presented to the ED from home via EMS after falling onto her L hip on concrete.  She has been NPO since admission in anticipation of surgery today d/t L hip fx. MST score of 0. She is noted to have deep pitting edema to BLE. Weight yesterday was 195 lb, weight on 10/5 was 197 lb, and PTA the most recently documented weight was on 06/08/19 when she weighed 199 lb. Will monitor weight trends closely.   Patient has hx of stage 5 CKD. Nephrology is currently following. No mention of plan to start dialysis, at least during this admission. Will monitor for any change in this plan.   Labs reviewed; CBG: 243 mg/dl, Na: 134 mmol/l, BUN: 67 mg/dl, creatinine: 6.64 mg/dl, Ca: 7.8 mg/dl, GFR: 7. Medications reviewed; 40 mg IV lasix x1 dose 10/27, sliding scale novolog, 3 units novolog TID, 18 units lantus/day, 40 mg oral protonix/day.    NUTRITION - FOCUSED PHYSICAL EXAM:  unable to complete at this time.   Diet Order:   Diet Order            Diet NPO time specified Except for: Sips with Meds  Diet effective midnight                 EDUCATION NEEDS:   No education needs have  been identified at this time  Skin:  Skin Assessment: Reviewed RN Assessment  Last BM:  PTA/unknown  Height:   Ht Readings from Last 1 Encounters:  12/28/19 5\' 10"  (1.778 m)    Weight:   Wt Readings from Last 1 Encounters:  12/28/19 88.6 kg     Estimated Nutritional Needs:  Kcal:  1900-2100 kcal Protein:  95-110 grams Fluid:  >/= 1.5 L/day     Jarome Matin, MS, RD, LDN, CNSC Inpatient Clinical Dietitian RD pager # available in [facility]  After hours/weekend pager # available in [facility]

## 2019-12-30 ENCOUNTER — Encounter (HOSPITAL_COMMUNITY): Payer: Self-pay | Admitting: Orthopedic Surgery

## 2019-12-30 DIAGNOSIS — E1165 Type 2 diabetes mellitus with hyperglycemia: Secondary | ICD-10-CM | POA: Diagnosis not present

## 2019-12-30 DIAGNOSIS — D649 Anemia, unspecified: Secondary | ICD-10-CM | POA: Diagnosis not present

## 2019-12-30 DIAGNOSIS — E118 Type 2 diabetes mellitus with unspecified complications: Secondary | ICD-10-CM | POA: Diagnosis not present

## 2019-12-30 DIAGNOSIS — N179 Acute kidney failure, unspecified: Secondary | ICD-10-CM | POA: Diagnosis not present

## 2019-12-30 DIAGNOSIS — T8189XA Other complications of procedures, not elsewhere classified, initial encounter: Secondary | ICD-10-CM | POA: Diagnosis not present

## 2019-12-30 DIAGNOSIS — T148XXA Other injury of unspecified body region, initial encounter: Secondary | ICD-10-CM | POA: Diagnosis not present

## 2019-12-30 DIAGNOSIS — N185 Chronic kidney disease, stage 5: Secondary | ICD-10-CM | POA: Diagnosis not present

## 2019-12-30 LAB — COMPREHENSIVE METABOLIC PANEL
ALT: 15 U/L (ref 0–44)
AST: 16 U/L (ref 15–41)
Albumin: 2.4 g/dL — ABNORMAL LOW (ref 3.5–5.0)
Alkaline Phosphatase: 168 U/L — ABNORMAL HIGH (ref 38–126)
Anion gap: 13 (ref 5–15)
BUN: 74 mg/dL — ABNORMAL HIGH (ref 6–20)
CO2: 16 mmol/L — ABNORMAL LOW (ref 22–32)
Calcium: 7.8 mg/dL — ABNORMAL LOW (ref 8.9–10.3)
Chloride: 103 mmol/L (ref 98–111)
Creatinine, Ser: 6.24 mg/dL — ABNORMAL HIGH (ref 0.44–1.00)
GFR, Estimated: 8 mL/min — ABNORMAL LOW (ref >60)
Glucose, Bld: 336 mg/dL — ABNORMAL HIGH (ref 70–99)
Potassium: 5 mmol/L (ref 3.5–5.1)
Sodium: 132 mmol/L — ABNORMAL LOW (ref 135–145)
Total Bilirubin: 0.8 mg/dL (ref 0.3–1.2)
Total Protein: 7.1 g/dL (ref 6.5–8.1)

## 2019-12-30 LAB — CBC
HCT: 26.2 % — ABNORMAL LOW (ref 36.0–46.0)
Hemoglobin: 8.3 g/dL — ABNORMAL LOW (ref 12.0–15.0)
MCH: 27.8 pg (ref 26.0–34.0)
MCHC: 31.7 g/dL (ref 30.0–36.0)
MCV: 87.6 fL (ref 80.0–100.0)
Platelets: 415 10*3/uL — ABNORMAL HIGH (ref 150–400)
RBC: 2.99 MIL/uL — ABNORMAL LOW (ref 3.87–5.11)
RDW: 14.9 % (ref 11.5–15.5)
WBC: 21.4 10*3/uL — ABNORMAL HIGH (ref 4.0–10.5)
nRBC: 0 % (ref 0.0–0.2)

## 2019-12-30 LAB — GLUCOSE, CAPILLARY
Glucose-Capillary: 299 mg/dL — ABNORMAL HIGH (ref 70–99)
Glucose-Capillary: 333 mg/dL — ABNORMAL HIGH (ref 70–99)
Glucose-Capillary: 298 mg/dL — ABNORMAL HIGH (ref 70–99)
Glucose-Capillary: 259 mg/dL — ABNORMAL HIGH (ref 70–99)

## 2019-12-30 LAB — HEMOGLOBIN A1C
Hgb A1c MFr Bld: 6.2 % — ABNORMAL HIGH (ref 4.8–5.6)
Mean Plasma Glucose: 131 mg/dL

## 2019-12-30 MED ORDER — AMLODIPINE BESYLATE 10 MG PO TABS
10.0000 mg | ORAL_TABLET | Freq: Every day | ORAL | Status: DC
  Administered 2019-12-30 – 2020-01-23 (×22): 10 mg via ORAL
  Filled 2019-12-30 (×22): qty 1

## 2019-12-30 MED ORDER — ENOXAPARIN SODIUM 30 MG/0.3ML ~~LOC~~ SOLN
30.0000 mg | SUBCUTANEOUS | Status: DC
  Administered 2019-12-31 – 2020-01-04 (×5): 30 mg via SUBCUTANEOUS
  Filled 2019-12-30 (×6): qty 0.3

## 2019-12-30 MED ORDER — CARVEDILOL 12.5 MG PO TABS
12.5000 mg | ORAL_TABLET | Freq: Two times a day (BID) | ORAL | Status: DC
  Administered 2019-12-30: 12.5 mg via ORAL
  Filled 2019-12-30: qty 1

## 2019-12-30 MED ORDER — HYDRALAZINE HCL 10 MG PO TABS
10.0000 mg | ORAL_TABLET | Freq: Four times a day (QID) | ORAL | Status: DC | PRN
  Administered 2019-12-30 – 2019-12-31 (×2): 10 mg via ORAL
  Filled 2019-12-30 (×2): qty 1

## 2019-12-30 MED ORDER — INSULIN GLARGINE 100 UNIT/ML ~~LOC~~ SOLN
20.0000 [IU] | Freq: Every day | SUBCUTANEOUS | Status: DC
  Administered 2019-12-30 – 2020-01-04 (×6): 20 [IU] via SUBCUTANEOUS
  Filled 2019-12-30 (×6): qty 0.2

## 2019-12-30 MED ORDER — FUROSEMIDE 10 MG/ML IJ SOLN
40.0000 mg | Freq: Once | INTRAMUSCULAR | Status: AC
  Administered 2019-12-30: 40 mg via INTRAVENOUS
  Filled 2019-12-30: qty 4

## 2019-12-30 MED ORDER — ASPIRIN 81 MG PO CHEW: 81.0000 mg | CHEWABLE_TABLET | Freq: Two times a day (BID) | ORAL | 0 refills | Status: DC

## 2019-12-30 MED ORDER — HYDROCODONE-ACETAMINOPHEN 5-325 MG PO TABS
1.0000 | ORAL_TABLET | Freq: Four times a day (QID) | ORAL | 0 refills | Status: DC | PRN
Start: 2019-12-30 — End: 2020-01-02

## 2019-12-30 MED ORDER — INSULIN ASPART 100 UNIT/ML ~~LOC~~ SOLN
3.0000 [IU] | Freq: Three times a day (TID) | SUBCUTANEOUS | Status: DC
  Administered 2019-12-30 – 2020-01-08 (×18): 3 [IU] via SUBCUTANEOUS

## 2019-12-30 MED ORDER — CALCIUM CARBONATE ANTACID 500 MG PO CHEW
1.0000 | CHEWABLE_TABLET | Freq: Three times a day (TID) | ORAL | Status: DC | PRN
  Administered 2019-12-30: 200 mg via ORAL
  Filled 2019-12-30: qty 1

## 2019-12-30 NOTE — Evaluation (Signed)
Physical Therapy Evaluation Patient Details Name: [name] MRN: [id number] DOB: 05-27-1965 Today's Date: 12/30/2019   History of Present Illness  54 year old female with DM, HTN, CKD 5, status post left foot Chopart amputation 2014 managed by podiatry, left SFA vascularization in 2020, prior CVA who came into the hospital after a ground-level fall resulting in left hip fracture,   S/P Intramedullary fixation, Left femur on 10/28.  Clinical Impression  The patient is motivated to mobilize. Patient unable  To stand from bed at [facility]. Resorted to laterral scoot transfer From bed to recliner with armrest dropped down.. Patient required mod assistance of 2. Patient has been  Using a knee scooter on Left to decrease WB on left plantar foot due to a wound.  Currently, in early recovery, Patient will most likely require a WC for mobility.  Patient may benefit from post acute CIR, did not mention to patient. Patient currently plans to Dc to the home of a friend with a ramp until she is able to move into new home. Will /discuss with patient CIR recommendation after  subsequent PT treatments over next few days. Pt admitted with above diagnosis. Pt currently with functional limitations due to the deficits listed below (see PT Problem List). Pt will benefit from skilled PT to increase their independence and safety with mobility to allow discharge to the venue listed below.        Follow Up Recommendations Home health PT;CIR    Equipment Recommendations  Wheelchair (measurements PT);Wheelchair cushion (measurements PT)    Recommendations for Other Services       Precautions / Restrictions Precautions Precautions: Fall Precaution Comments: has been limited WB on L foot plantar due to sore, using Knee walker PTA. Restrictions LLE Weight Bearing: Weight bearing as tolerated Other Position/Activity Restrictions: WBAT on L eft hip but has been essentially NWB on L plantar PTA      Mobility   Bed Mobility Overal bed mobility: Needs Assistance Bed Mobility: Supine to Sit     Supine to sit: Mod assist;+2 for physical assistance;+2 for safety/equipment     General bed mobility comments: HOB flat, required mod assist with left leg and trunk to sit upright    Transfers Overall transfer level: Needs assistance Equipment used: Rolling walker (2 wheeled) Transfers: Lateral/Scoot Transfers          Lateral/Scoot Transfers: Mod assist;+2 safety/equipment General transfer comment: Attempted x 3 to stand from bed raised, pt. unable to stand, mod scoot to right from bed to dropped arm recliner, using bed pad to assist with scooting  Ambulation/Gait             General Gait Details: NT  Stairs            Wheelchair Mobility    Modified Rankin (Stroke Patients Only)       Balance Overall balance assessment: Needs assistance Sitting-balance support: Feet supported;No upper extremity supported Sitting balance-Leahy Scale: Good         Standing balance comment: NT                             Pertinent Vitals/Pain Pain Assessment: 0-10 Pain Score: 7  Pain Location: L hip Pain Descriptors / Indicators: Grimacing;Guarding;Jabbing;Moaning Pain Intervention(s): Monitored during session;Premedicated before session;Limited activity within patient's tolerance    Home Living Family/patient expects to be discharged to:: Private residence Living Arrangements: Children;Non-relatives/Friends Available Help at Discharge: Friend(s);Family;Available PRN/intermittently Type of Home: House  Home Access: Ramped entrance     Home Layout: One level Home Equipment: [place] - 2 wheels;Bedside commode;Toilet riser Additional Comments: plans to stay with friends with ramp until moves to another home.    Prior Function Level of Independence: Independent with assistive device(s)         Comments: Using knee walker     Hand Dominance   Dominant Hand:  Right    Extremity/Trunk Assessment   Upper Extremity Assessment Upper Extremity Assessment: Overall WFL for tasks assessed    Lower Extremity Assessment Lower Extremity Assessment: LLE deficits/detail LLE Deficits / Details: trans met amp with ace wrap, requires assistance to move leg across bed    Cervical / Trunk Assessment Cervical / Trunk Assessment: Normal  Communication   Communication: No difficulties  Cognition Arousal/Alertness: Awake/alert Behavior During Therapy: WFL for tasks assessed/performed Overall Cognitive Status: Within Functional Limits for tasks assessed                                        General Comments      Exercises     Assessment/Plan    PT Assessment Patient needs continued PT services  PT Problem List Decreased strength;Decreased knowledge of use of DME;Decreased activity tolerance;Decreased safety awareness;Decreased skin integrity;Decreased knowledge of precautions;Decreased mobility       PT Treatment Interventions DME instruction;Functional mobility training;Patient/family education;Therapeutic activities;Therapeutic exercise;Wheelchair mobility training    PT Goals (Current goals can be found in the Care Plan section)  Acute Rehab PT Goals Patient Stated Goal: to go home PT Goal Formulation: With patient Time For Goal Achievement: 01/13/20 Potential to Achieve Goals: Good    Frequency Min 6X/week   Barriers to discharge        Co-evaluation PT/OT/SLP Co-Evaluation/Treatment: Yes Reason for Co-Treatment: For patient/therapist safety;To address functional/ADL transfers PT goals addressed during session: Mobility/safety with mobility OT goals addressed during session: ADL's and self-care       AM-PAC PT "6 Clicks" Mobility  Outcome Measure Help needed turning from your back to your side while in a flat bed without using bedrails?: A Lot Help needed moving from lying on your back to sitting on the side of  a flat bed without using bedrails?: A Lot Help needed moving to and from a bed to a chair (including a wheelchair)?: A Lot Help needed standing up from a chair using your arms (e.g., wheelchair or bedside chair)?: Total Help needed to walk in hospital room?: Total Help needed climbing 3-5 steps with a railing? : Total 6 Click Score: 9    End of Session Equipment Utilized During Treatment: Gait belt Activity Tolerance: Patient tolerated treatment well Patient left: in chair;with call bell/phone within reach Nurse Communication: Mobility status PT Visit Diagnosis: Difficulty in walking, not elsewhere classified (R26.2);Pain Pain - Right/Left: Left Pain - part of body: Hip    Time: 0726-0808 PT Time Calculation (min) (ACUTE ONLY): 42 min   Charges:   PT Evaluation $PT Eval Moderate Complexity: 1 Mod PT Treatments $Therapeutic Activity: 8-22 mins        [name] PT Acute Rehabilitation Services Pager [phone] Office [phone]   [name] 12/30/2019, 8:35 AM

## 2019-12-30 NOTE — Progress Notes (Signed)
Inpatient Diabetes Program Recommendations  AACE/ADA: New Consensus Statement on Inpatient Glycemic Control (2015)  Target Ranges:  Prepandial:   less than 140 mg/dL      Peak postprandial:   less than 180 mg/dL (1-2 hours)      Critically ill patients:  140 - 180 mg/dL   Lab Results  Component Value Date   GLUCAP 299 (H) 12/30/2019   HGBA1C 6.2 (H) 12/29/2019    Review of Glycemic Control Results for [name], [name] (MRN [id number]) as of 12/30/2019 10:17  Ref. Range 12/29/2019 07:47 12/29/2019 11:34 12/29/2019 13:59 12/29/2019 15:13 12/29/2019 16:52 12/29/2019 21:24 12/30/2019 08:35  Glucose-Capillary Latest Ref Range: 70 - 99 mg/dL 243 (H) 169 (H) 159 (H) 192 (H) 216 (H) 249 (H) 299 (H)   Diabetes history: DM 2 Outpatient Diabetes medications: Lantus 20 units Daily, Novolog 10 units tid meal coverage Current orders for Inpatient glycemic control: Lantus 20 units, Novolog 0-9 units tid + Novolog 3 units tid meal coverage  Inpatient Diabetes Program Recommendations:   Noted Lantus now @ 20 units qd + Novolog 3 units tid meal coverage -add Novolog 0-5 units hs correction  Secure chat sent to Dr. Cruzita Lederer.  Thank you, Nani Gasser. Adriann Thau, RN, MSN, CDE  Diabetes Coordinator Inpatient Glycemic Control Team Team Pager [phone] (8am-5pm) 12/30/2019 10:23 AM

## 2019-12-30 NOTE — Progress Notes (Signed)
    Subjective:  Patient reports pain as mild to moderate.  Denies N/V/CP/SOB.   Objective:   VITALS:   Vitals:   12/29/19 2030 12/29/19 2232 12/30/19 0237 12/30/19 0637  BP: (!) 170/61 (!) 164/61 (!) 178/64 (!) 193/76  Pulse: 81 80 82 86  Resp: 18 20  20   Temp: 98.1 F (36.7 C) 98.2 F (36.8 C) 97.8 F (36.6 C) 98.4 F (36.9 C)  TempSrc: Oral Oral Oral Oral  SpO2: 97% 97% 98% 97%  Weight:      Height:        NAD ABD soft Neurovascular intact Incision: dressing C/D/I   Lab Results  Component Value Date   WBC 21.4 (H) 12/30/2019   HGB 8.3 (L) 12/30/2019   HCT 26.2 (L) 12/30/2019   MCV 87.6 12/30/2019   PLT 415 (H) 12/30/2019   BMET    Component Value Date/Time   NA 132 (L) 12/30/2019 0415   NA 140 10/29/2018 1439   K 5.0 12/30/2019 0415   CL 103 12/30/2019 0415   CO2 16 (L) 12/30/2019 0415   GLUCOSE 336 (H) 12/30/2019 0415   BUN 74 (H) 12/30/2019 0415   BUN 43 (H) 10/29/2018 1439   CREATININE 6.24 (H) 12/30/2019 0415   CREATININE 1.92 (H) 01/10/2016 1528   CALCIUM 7.8 (L) 12/30/2019 0415   GFRNONAA 8 (L) 12/30/2019 0415   GFRNONAA 30 (L) 01/10/2016 1528   GFRAA 13 (L) 12/17/2018 0907   GFRAA 35 (L) 01/10/2016 1528     Assessment/Plan: 1 Day Post-Op   Principal Problem:   Fracture Active Problems:   Diabetes mellitus type 2, uncontrolled, with complications (HCC)   CKD (chronic kidney disease) stage 5, GFR less than 15 ml/min (HCC)   AKI (acute kidney injury) (HCC)   Volume overload   Acute on chronic anemia   WBAT with walker DVT ppx: Lovenox, SCDs, TEDS PO pain control PT/OT Dispo: D/C planning     Dorothyann Peng 12/30/2019, 11:04 AM   [facility] is now [address], [place], [place] [address] Phone: [phone] [email or web address] Facebook  [employer]

## 2019-12-30 NOTE — Plan of Care (Signed)
  Problem: Health Behavior/Discharge Planning: Goal: Ability to manage health-related needs will improve Outcome: Progressing   Problem: Clinical Measurements: Goal: Ability to maintain clinical measurements within normal limits will improve Outcome: Progressing Goal: Will remain free from infection Outcome: Progressing Goal: Diagnostic test results will improve Outcome: Progressing Goal: Respiratory complications will improve Outcome: Progressing Goal: Cardiovascular complication will be avoided Outcome: Progressing   Problem: Activity: Goal: Risk for activity intolerance will decrease Outcome: Progressing   Problem: Nutrition: Goal: Adequate nutrition will be maintained Outcome: Progressing   Problem: Coping: Goal: Level of anxiety will decrease Outcome: Progressing   Problem: Elimination: Goal: Will not experience complications related to bowel motility Outcome: Progressing Goal: Will not experience complications related to urinary retention Outcome: Progressing   Problem: Pain Managment: Goal: General experience of comfort will improve Outcome: Progressing   Problem: Safety: Goal: Ability to remain free from injury will improve Outcome: Progressing   Problem: Skin Integrity: Goal: Risk for impaired skin integrity will decrease Outcome: Progressing   Problem: Education: Goal: Verbalization of understanding the information provided (i.e., activity precautions, restrictions, etc) will improve Outcome: Progressing Goal: Individualized Educational Video(s) Outcome: Progressing   Problem: Activity: Goal: Ability to ambulate and perform ADLs will improve Outcome: Progressing   Problem: Clinical Measurements: Goal: Postoperative complications will be avoided or minimized Outcome: Progressing   Problem: Self-Concept: Goal: Ability to maintain and perform role responsibilities to the fullest extent possible will improve Outcome: Progressing   Problem: Pain  Management: Goal: Pain level will decrease Outcome: Progressing

## 2019-12-30 NOTE — Progress Notes (Signed)
Physical Therapy Treatment Patient Details Name: [name] MRN: [id number] DOB: January 02, 1966 Today's Date: 12/30/2019    History of Present Illness 54 year old female with DM, HTN, CKD 5, status post left foot Chopart amputation 2014 managed by podiatry, left SFA vascularization in 2020, prior CVA who came into the hospital after a ground-level fall resulting in left hip fracture,   S/P Intramedullary fixation, Left femur on 10/28.    PT Comments    The patient has been in recliner several hours. [name] lift equipment utilized to stand from recliner and then [facility] , requiring 2 max assist. Patient may benefit from CIR tor progress mobility to Dc home with family.  Follow Up Recommendations  Home health PT;CIR     Equipment Recommendations  Wheelchair (measurements PT);Wheelchair cushion (measurements PT)    Recommendations for Other Services Rehab consult     Precautions / Restrictions Precautions Precautions: Fall Precaution Comments: has been limited WB on L foot plantar due to sore, using Knee walker PTA. Restrictions Weight Bearing Restrictions: Yes LLE Weight Bearing: Weight bearing as tolerated Other Position/Activity Restrictions: WBAT on L eft hip but has been essentially NWB on L plantar PTA    Mobility  Bed Mobility Overal bed mobility: Needs Assistance Bed Mobility: Sit to Supine     Supine to sit: Mod assist;+2 for physical assistance;+2 for safety/equipment Sit to supine: +2 for safety/equipment;+2 for physical assistance;Max assist   General bed mobility comments: assist both legs slowly onto bed  Transfers Overall transfer level: Needs assistance Equipment used: Rolling walker (2 wheeled) Transfers: Sit to/from Stand Sit to Stand: +2 physical assistance;+2 safety/equipment;Max assist        Lateral/Scoot Transfers: +2 physical assistance;+2 safety/equipment;Max assist General transfer comment: assisted to stand at [facility] stedy from reclioner  then from [facility] with extensive assistance of 2, LLE required repositioning in STEDY .Transferred to [facility] then to bed.  Ambulation/Gait                 Stairs             Wheelchair Mobility    Modified Rankin (Stroke Patients Only)       Balance Overall balance assessment: Needs assistance Sitting-balance support: Feet supported;No upper extremity supported Sitting balance-Leahy Scale: Good     Standing balance support: Bilateral upper extremity supported;During functional activity Standing balance-Leahy Scale: Poor Standing balance comment: relies on UE ans [name] stedy                            Cognition Arousal/Alertness: Awake/alert Behavior During Therapy: WFL for tasks assessed/performed Overall Cognitive Status: Within Functional Limits for tasks assessed                                        Exercises      General Comments        Pertinent Vitals/Pain Pain Assessment: Faces Faces Pain Scale: Hurts whole lot Pain Location: L hip Pain Descriptors / Indicators: Grimacing;Guarding;Jabbing;Moaning Pain Intervention(s): Patient requesting pain meds-RN notified;Repositioned;Ice applied    Home Living Family/patient expects to be discharged to:: Private residence Living Arrangements: Children;Non-relatives/Friends Available Help at Discharge: Friend(s);Family;Available PRN/intermittently Type of Home: House Home Access: Ramped entrance   Home Layout: One level Home Equipment: Walker - 2 wheels;Bedside commode;Toilet riser Additional Comments: plans to stay with friends with ramp until moves to another  home.    Prior Function Level of Independence: Independent with assistive device(s)      Comments: Using knee walker   PT Goals (current goals can now be found in the care plan section) Acute Rehab PT Goals Patient Stated Goal: to go home Progress towards PT goals: Progressing toward goals    Frequency    Min  6X/week      PT Plan Current plan remains appropriate    Co-evaluation PT/OT/SLP Co-Evaluation/Treatment: Yes Reason for Co-Treatment: Necessary to address cognition/behavior during functional activity;For patient/therapist safety;To address functional/ADL transfers PT goals addressed during session: Mobility/safety with mobility OT goals addressed during session: ADL's and self-care      AM-PAC PT "6 Clicks" Mobility   Outcome Measure  Help needed turning from your back to your side while in a flat bed without using bedrails?: A Lot Help needed moving from lying on your back to sitting on the side of a flat bed without using bedrails?: A Lot Help needed moving to and from a bed to a chair (including a wheelchair)?: Total Help needed standing up from a chair using your arms (e.g., wheelchair or bedside chair)?: Total Help needed to walk in hospital room?: Total Help needed climbing 3-5 steps with a railing? : Total 6 Click Score: 8    End of Session Equipment Utilized During Treatment: Gait belt Activity Tolerance: Patient limited by pain Patient left: in bed;with call bell/phone within reach Nurse Communication: Mobility status PT Visit Diagnosis: Difficulty in walking, not elsewhere classified (R26.2);Pain Pain - Right/Left: Left Pain - part of body: Hip     Time: 8185-9093 PT Time Calculation (min) (ACUTE ONLY): 37 min  Charges:  $Therapeutic Activity: 8-22 mins                     [name] PT Acute Rehabilitation Services Pager [phone] Office [phone]  [name] 12/30/2019, 4:16 PM

## 2019-12-30 NOTE — Progress Notes (Signed)
Occupational Therapy Evaluation  Patient with functional deficits listed below impacting safety and independence with self care. Patient reports she and DTR are moving in with friends with ramp access [place] until they can find their own place. At baseline patient is mod I with mobility and self care. Currently patient requiring mod A x2 for bed mobility due to pain in L hip, multiple attempts to stand with rolling walker however patient unable to tolerate therefore lateral scoot from EOB to drop arm recliner with mod A. Recommend continued acute OT services in order to facilitate D/C to venue listed below.    12/30/19 1300  OT Visit Information  Last OT Received On 12/30/19  Assistance Needed +2  PT/OT/SLP Co-Evaluation/Treatment Yes  Reason for Co-Treatment For patient/therapist safety;To address functional/ADL transfers  OT goals addressed during session ADL's and self-care  History of Present Illness 54 year old female with DM, HTN, CKD 5, status post left foot Chopart amputation 2014 managed by podiatry, left SFA vascularization in 2020, prior CVA who came into the hospital after a ground-level fall resulting in left hip fracture,   S/P Intramedullary fixation, Left femur on 10/28.  Precautions  Precautions Fall  Precaution Comments has been limited WB on L foot plantar due to sore, using Knee walker PTA.  Restrictions  Weight Bearing Restrictions Yes  LLE Weight Bearing WBAT  Other Position/Activity Restrictions WBAT on L eft hip but has been essentially NWB on L plantar PTA  Home Living  Family/patient expects to be discharged to: Private residence  Living Arrangements Children;Non-relatives/Friends  Available Help at Discharge Friend(s);Family;Available PRN/intermittently  Type of [place] One level  [place] - 2 wheels;BSC;Toilet riser  Additional Comments plans to stay with friends with  ramp until moves to another home.  Prior Function  Level of Independence Independent with assistive device(s)  Comments Using knee walker  Communication  Communication No difficulties  Pain Assessment  Pain Assessment Faces  Faces Pain Scale 6  Pain Location L hip  Pain Descriptors / Indicators Grimacing;Guarding;Jabbing;Moaning  Pain Intervention(s) Monitored during session;Premedicated before session  Cognition  Arousal/Alertness Awake/alert  Behavior During Therapy WFL for tasks assessed/performed  Overall Cognitive Status Within Functional Limits for tasks assessed  Upper Extremity Assessment  Upper Extremity Assessment Generalized weakness  Lower Extremity Assessment  Lower Extremity Assessment Defer to PT evaluation  ADL  Overall ADL's  Needs assistance/impaired  Eating/Feeding Independent;Sitting  Grooming Set up;Sitting  Upper Body Bathing Set up;Sitting  Lower Body Bathing Maximal assistance;Sitting/lateral leans  Upper Body Dressing  Set up;Sitting  Lower Body Dressing Maximal assistance;Sitting/lateral leans;Sit to/from stand  Lower Body Dressing Details (indicate cue type and reason) d/t pain in L LE with mobility  Toilet Transfer BSC;Moderate assistance;+2 for physical assistance;+2 for safety/equipment  Toilet Transfer Details (indicate cue type and reason) lateral scoot from EOB to drop arm recliner, requires increased time due to pain  Toileting- Clothing Manipulation and Hygiene Total assistance  Functional mobility during ADLs Moderate assistance;+2 for safety/equipment;+2 for physical assistance  General ADL Comments patient requiring increased assistance with self care due to pain, decreased activity tolerance, balance, safety awareness  Bed Mobility  Overal bed mobility Needs Assistance  Bed Mobility Supine to Sit  Supine to sit Mod assist;+2 for physical assistance;+2 for safety/equipment  General bed mobility comments HOB flat, required mod assist with  left leg and trunk to sit upright  Transfers  Overall transfer level Needs  assistance  Equipment used Rolling walker (2 wheeled)  Transfers Lateral/Scoot Transfers   Lateral/Scoot Transfers Mod assist;+2 safety/equipment  General transfer comment Attempted x 3 to stand from bed raised, pt. unable to stand, mod scoot to right from bed to dropped arm recliner, using bed pad to assist with scooting  Balance  Overall balance assessment Needs assistance  Sitting-balance support Feet supported;No upper extremity supported  Sitting balance-Leahy Scale Good  Standing balance comment NT  OT - End of Session  Equipment Utilized During Treatment Gait belt  Activity Tolerance Patient tolerated treatment well  Patient left in chair;with call bell/phone within reach  Nurse Communication Mobility status  OT Assessment  OT Recommendation/Assessment Patient needs continued OT Services  OT Visit Diagnosis Pain;Other abnormalities of gait and mobility (R26.89);Unsteadiness on feet (R26.81);History of falling (Z91.81);Muscle weakness (generalized) (M62.81)  Pain - Right/Left Left  Pain - part of body Hip  OT Problem List Decreased strength;Decreased activity tolerance;Impaired balance (sitting and/or standing);Decreased safety awareness;Decreased knowledge of use of DME or AE;Pain  OT Plan  OT Frequency (ACUTE ONLY) Min 2X/week  OT Treatment/Interventions (ACUTE ONLY) Self-care/ADL training;DME and/or AE instruction;Energy conservation;Therapeutic activities;Balance training;Patient/family education  AM-PAC OT "6 Clicks" Daily Activity Outcome Measure (Version 2)  Help from another person eating meals? 4  Help from another person taking care of personal grooming? 3  Help from another person toileting, which includes using toliet, bedpan, or urinal? 2  Help from another person bathing (including washing, rinsing, drying)? 2  Help from another person to put on and taking off regular upper body clothing? 3   Help from another person to put on and taking off regular lower body clothing? 2  6 Click Score 16  OT Recommendation  Recommendations for Other Services Rehab consult  Follow Up Recommendations CIR  OT Equipment Wheelchair (measurements OT)  Individuals Consulted  Consulted and Agree with Results and Recommendations Patient  Acute Rehab OT Goals  Patient Stated Goal to go home  OT Goal Formulation With patient  Time For Goal Achievement 01/13/20  Potential to Achieve Goals Good  OT Time Calculation  OT Start Time (ACUTE ONLY) 0724  OT Stop Time (ACUTE ONLY) 0804  OT Time Calculation (min) 40 min  OT General Charges  $OT Visit 1 Visit  OT Evaluation  $OT Eval Moderate Complexity 1 Mod  Written Expression  Dominant Hand Right   Delbert Phenix OT OT pager: [phone]

## 2019-12-30 NOTE — Progress Notes (Signed)
Occupational Therapy Treatment Patient Details Name: [name] MRN: [id number] DOB: 12/15/1965 Today's Date: 12/30/2019    History of present illness 54 year old female with DM, HTN, CKD 5, status post left foot Chopart amputation 2014 managed by podiatry, left SFA vascularization in 2020, prior CVA who came into the hospital after a ground-level fall resulting in left hip fracture,   S/P Intramedullary fixation, Left femur on 10/28.   OT comments  Patient wanting to transfer to [facility] and required assistance of two physically with the stedy and one person to manage LLE due to pain to transfer to [facility] and then to bed. Patient total assist for toileting. Patient limited by pain. Cont POC.   Follow Up Recommendations  CIR    Equipment Recommendations  Wheelchair (measurements OT)    Recommendations for Other Services Rehab consult    Precautions / Restrictions Precautions Precautions: Fall Precaution Comments: has been limited WB on L foot plantar due to sore, using Knee walker PTA. Restrictions Weight Bearing Restrictions: Yes LLE Weight Bearing: Weight bearing as tolerated Other Position/Activity Restrictions: WBAT on L eft hip but has been essentially NWB on L plantar PTA       Mobility Bed Mobility Overal bed mobility: Needs Assistance Bed Mobility: Sit to Supine     Supine to sit: Mod assist;+2 for physical assistance;+2 for safety/equipment Sit to supine: +2 for safety/equipment;+2 for physical assistance;Max assist   General bed mobility comments: assist both legs slowly onto bed as patient guarding due to pain.  Transfers Overall transfer level: Needs assistance Equipment used: Rolling walker (2 wheeled) Transfers: Sit to/from Stand Sit to Stand: +2 physical assistance;+2 safety/equipment;Max assist        Lateral/Scoot Transfers: +2 physical assistance;+2 safety/equipment;Max assist General transfer comment: Attempted use of walker with standing and  patient unable to come up from recliner height. Assisted to stand at [facility] stedy from reclioner then from [facility] with extensive assistance of 2, LLE required repositioning in STEDY by third person.Transferred to [facility] then to bed.    Balance Overall balance assessment: Needs assistance Sitting-balance support: Feet supported;No upper extremity supported Sitting balance-Leahy Scale: Good     Standing balance support: Bilateral upper extremity supported;During functional activity Standing balance-Leahy Scale: Poor Standing balance comment: relies on UE ans [name] stedy                           ADL either performed or assessed with clinical judgement   ADL Overall ADL's : Needs assistance/impaired Eating/Feeding: Independent;Sitting   Grooming: Set up;Sitting   Upper Body Bathing: Set up;Sitting   Lower Body Bathing: Maximal assistance;Sitting/lateral leans   Upper Body Dressing : Set up;Sitting   Lower Body Dressing: Maximal assistance;Sitting/lateral leans;Sit to/from stand Lower Body Dressing Details (indicate cue type and reason): d/t pain in L LE with mobility Toilet Transfer: BSC;Stand-pivot;Total assistance;+2 for physical assistance;+2 for safety/equipment Toilet Transfer Details (indicate cue type and reason): Total assist to transfer to BSc with use of [name]. +3 assistance for safety to manage patient, equipment, and patient's LLE due to pain. Toileting- Clothing Manipulation and Hygiene: Total assistance;Sitting/lateral lean Toileting - Clothing Manipulation Details (indicate cue type and reason): Total assist for toileting on BSC.     Functional mobility during ADLs: Moderate assistance;+2 for safety/equipment;+2 for physical assistance General ADL Comments: patient requiring increased assistance with self care due to pain, decreased activity tolerance, balance, safety awareness     Vision Patient Visual Report: No change  from baseline     Perception      Praxis      Cognition Arousal/Alertness: Awake/alert Behavior During Therapy: WFL for tasks assessed/performed Overall Cognitive Status: Within Functional Limits for tasks assessed                                          Exercises     Shoulder Instructions       General Comments      Pertinent Vitals/ Pain       Pain Assessment: Faces Faces Pain Scale: Hurts whole lot Pain Location: LLE Pain Descriptors / Indicators: Grimacing;Guarding;Jabbing;Moaning Pain Intervention(s): Patient requesting pain meds-RN notified  Home Living Family/patient expects to be discharged to:: Private residence Living Arrangements: Children;Non-relatives/Friends Available Help at Discharge: Friend(s);Family;Available PRN/intermittently Type of Home: House Home Access: Ramped entrance     Home Layout: One level     Bathroom Shower/Tub: Walk-in shower         Home Equipment: Environmental consultant - 2 wheels;Bedside commode;Toilet riser   Additional Comments: plans to stay with friends with ramp until moves to another home.      Prior Functioning/Environment Level of Independence: Independent with assistive device(s)        Comments: Using knee walker   Frequency  Min 2X/week        Progress Toward Goals  OT Goals(current goals can now be found in the care plan section)  Progress towards OT goals: OT to reassess next treatment  Acute Rehab OT Goals Patient Stated Goal: to go home OT Goal Formulation: With patient Time For Goal Achievement: 01/13/20 Potential to Achieve Goals: Good ADL Goals Pt Will Perform Lower Body Dressing: with supervision;with adaptive equipment;sit to/from stand;sitting/lateral leans Pt Will Transfer to Toilet: with supervision;squat pivot transfer;bedside commode Pt Will Perform Toileting - Clothing Manipulation and hygiene: with supervision;sitting/lateral leans;sit to/from stand Additional ADL Goal #1: Patient will be supervision level for  bed mobility in preparation for self care tasks.  Plan Discharge plan remains appropriate    Co-evaluation    PT/OT/SLP Co-Evaluation/Treatment: Yes Reason for Co-Treatment: For patient/therapist safety;To address functional/ADL transfers PT goals addressed during session: Mobility/safety with mobility OT goals addressed during session: ADL's and self-care      AM-PAC OT "6 Clicks" Daily Activity     Outcome Measure   Help from another person eating meals?: None Help from another person taking care of personal grooming?: A Little Help from another person toileting, which includes using toliet, bedpan, or urinal?: Total Help from another person bathing (including washing, rinsing, drying)?: A Lot Help from another person to put on and taking off regular upper body clothing?: A Little Help from another person to put on and taking off regular lower body clothing?: A Lot 6 Click Score: 15    End of Session Equipment Utilized During Treatment: Gait belt;Rolling walker (stedy)  OT Visit Diagnosis: Pain;Other abnormalities of gait and mobility (R26.89);Unsteadiness on feet (R26.81);History of falling (Z91.81);Muscle weakness (generalized) (M62.81) Pain - Right/Left: Left Pain - part of body: Knee;Leg;Ankle and joints of foot   Activity Tolerance Patient tolerated treatment well   Patient Left in chair;with call bell/phone within reach   Nurse Communication Mobility status        Time: 1500-1530 OT Time Calculation (min): 30 min  Charges: OT General Charges $OT Visit: 1 Visit OT Evaluation $OT Eval Moderate Complexity: 1 Mod OT Treatments $Self  Care/Home Management : 8-22 mins  Derl Barrow, OTR/L [place] [phone] Pager: [place] 12/30/2019, 4:34 PM

## 2019-12-30 NOTE — TOC Initial Note (Signed)
Transition of Care [facility]) - Initial/Assessment Note    Patient Details  Name: [name] MRN: [id number] Date of Birth: 07/10/1965  Transition of Care [facility]) CM/SW Contact:    Dessa Phi, RN Phone Number: 12/30/2019, 3:08 PM  Clinical Narrative: PT/OT recc CIR-CIR cons placed-await recc.                  Expected Discharge Plan: IP Rehab Facility Barriers to Discharge: Continued Medical Work up   Patient Goals and CMS Choice Patient states their goals for this hospitalization and ongoing recovery are:: go home CMS Medicare.gov Compare Post Acute Care list provided to:: Patient Choice offered to / list presented to : Patient  Expected Discharge Plan and Services Expected Discharge Plan: [place]   Discharge Planning Services: CM Consult Post Acute Care Choice: IP Rehab Living arrangements for the past 2 months: Single Family Home                                      Prior Living Arrangements/Services Living arrangements for the past 2 months: Single Family Home Lives with:: Self Patient language and need for interpreter reviewed:: Yes Do you feel safe going back to the place where you live?: Yes      Need for Family Participation in Patient Care: No (Comment) Care giver support system in place?: Yes (comment)   Criminal Activity/Legal Involvement Pertinent to Current Situation/Hospitalization: No - Comment as needed  Activities of Daily Living Home Assistive Devices/Equipment: CBG Meter, Other (Comment), Walker (specify type), Bedside commode/3-in-1, Shower chair without back (walk-in shower, tub/shower unit, toilet riser, front wheeled walker) ADL Screening (condition at time of admission) Patient's cognitive ability adequate to safely complete daily activities?: Yes Is the patient deaf or have difficulty hearing?: No Does the patient have difficulty seeing, even when wearing glasses/contacts?: No Does the patient have difficulty  concentrating, remembering, or making decisions?: No Patient able to express need for assistance with ADLs?: Yes Does the patient have difficulty dressing or bathing?: Yes Independently performs ADLs?: No Communication: Independent Dressing (OT): Needs assistance Is this a change from baseline?: Change from baseline, expected to last >3 days Grooming: Needs assistance Is this a change from baseline?: Change from baseline, expected to last >3 days Feeding: Needs assistance Is this a change from baseline?: Change from baseline, expected to last >3 days Bathing: Needs assistance Is this a change from baseline?: Change from baseline, expected to last >3 days Toileting: Dependent Is this a change from baseline?: Change from baseline, expected to last >3days In/Out Bed: Dependent Is this a change from baseline?: Change from baseline, expected to last >3 days Walks in Home: Dependent Is this a change from baseline?: Change from baseline, expected to last >3 days Does the patient have difficulty walking or climbing stairs?: Yes (left foot amputation) Weakness of Legs: Left Weakness of Arms/Hands: None  Permission Sought/Granted Permission sought to share information with : Case Manager Permission granted to share information with : Yes, Verbal Permission Granted  Share Information with NAME: Case manager     Permission granted to share info w Relationship: Kaitlyn(dtr) [phone]     Emotional Assessment Appearance:: Appears stated age Attitude/Demeanor/Rapport: Gracious Affect (typically observed): Accepting Orientation: : Oriented to Self, Oriented to Place, Oriented to  Time, Oriented to Situation Alcohol / Substance Use: Not Applicable Psych Involvement: No (comment)  Admission diagnosis:  Fracture [T14.8XXA]  Closed fracture of left hip, initial encounter ([place]) [S72.002A] Fall, initial encounter [W19.XXXA] Anemia, unspecified type [D64.9] Stage 5 chronic kidney disease not on  chronic dialysis [facility]) [N18.5] Patient Active Problem List   Diagnosis Date Noted  . Fracture 12/28/2019  . AKI (acute kidney injury) ([place]) 12/28/2019  . Volume overload 12/28/2019  . Acute on chronic anemia 12/28/2019  . CKD (chronic kidney disease) stage 5, GFR less than 15 ml/min (HCC) 12/06/2019  . History of Chopart amputation of left foot ([place]) 12/25/2017  . Cerebral thrombosis with cerebral infarction 04/08/2017  . Acute right-sided weakness 04/07/2017  . Slurred speech 04/07/2017  . Right sided weakness 04/07/2017  . Hyperhidrosis 09/01/2016  . Migraine with aura and without status migrainosus, not intractable 07/28/2016  . 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[place]) 08/04/2014  . CKD stage 3 due to type 2 diabetes mellitus ([place]) 06/27/2014  . Vitamin D insufficiency 05/08/2014  . Obesity (BMI 30.0-34.9) 05/08/2014  . Unilateral amputation of left foot ([place]) 05/08/2014  . Bursitis of left shoulder 02/14/2014  . Neck pain 01/03/2014  . Diabetes mellitus type 2, uncontrolled, with complications ([place]) 55/97/4163  . 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.23) 01/14/2013  . Insomnia 08/04/2012  . Diabetic neuropathy, painful ([place]) 08/04/2011  . Anxiety and depression 05/16/2010  . Essential hypertension, benign 11/01/2007  . Female stress incontinence 11/01/2007  . TOBACCO DEPENDENCE 04/30/2006  . GASTROESOPHAGEAL REFLUX, NO ESOPHAGITIS 04/30/2006  . Irritable bowel syndrome 04/30/2006   PCP:  Julian Hy, PA-C Pharmacy:   [facility] [place] [address] Phone: [phone] Fax: [phone]  [id number] - [place], [place]. AT [address] [place] [address] Phone: [phone] Fax: [phone]     Social Determinants of Health (SDOH) Interventions     Readmission Risk Interventions No flowsheet data found.

## 2019-12-30 NOTE — Progress Notes (Signed)
PROGRESS NOTE  [name] UXL:[id number] DOB: [birth year] DOA: 12/28/2019 PCP: Julian Hy, PA-C   LOS: 2 days   Brief Narrative / Interim history: 54 year old female with DM, HTN, CKD 5, status post left foot Chopart amputation 2014 managed by podiatry, left SFA vascularization in 2020, prior CVA who came into the hospital after a ground-level fall resulting in left hip fracture.  Orthopedic surgery has consulted and plans to operate 10/28.  Nephrology also consulted on admission due to chronic kidney disease.  Subjective / 24h Interval events: No complaints, mild left hip soreness.  She is in the chair, just worked with physical therapy  Assessment & Plan: Principal Problem Intertrochanteric fracture of the proximal left femur with avulsion of the lesser trochanter status post mechanical fall-orthopedic surgery consulted, she is status post intramedullary fixation of the left femur by Dr. Lyla Glassing on 10/28 -PT recommended CIR, consult today -Continue aggressive postop PT  Active Problems Acute on chronic anemia of chronic kidney disease-hemoglobin dropped to 6.3, status post 2 units of packed red blood cells.  She was given Lasix x1.  -Hemoglobin remains stable today at 8.3, continue to monitor postop  Acute kidney injury on chronic kidney disease stage V -baseline creatinine around 4, currently at 6.  She received 2 units of packed red blood cells.  Nephrology consulted -Now that she is postop, continues to have lower extremity swelling, Lasix challenge today  Volume overload with bilateral lower extremity edema-post op, give Lasix x1 and monitor  Type 2 diabetes mellitus -Continue Lantus, sliding scale, monitor CBGs  CBG (last 3)  Recent Labs    12/29/19 1652 12/29/19 2124 12/30/19 0835  GLUCAP 216* 249* 299*    Essential hypertension-continue hydralazine, increase Norvasc and Coreg today due to persistently elevated blood pressure.  Scheduled Meds: .  (feeding supplement) PROSource Plus  30 mL Oral TID BM  . alosetron  1 mg Oral BID  . amLODipine  5 mg Oral Daily  . atorvastatin  40 mg Oral q1800  . carvedilol  6.25 mg Oral BID WC  . Chlorhexidine Gluconate Cloth  6 each Topical Daily  . docusate sodium  100 mg Oral BID  . DULoxetine  60 mg Oral Daily  . [START ON 12/31/2019] enoxaparin (LOVENOX) injection  30 mg Subcutaneous Q24H  . hydrALAZINE  50 mg Oral TID  . insulin aspart  0-9 Units Subcutaneous TID WC  . insulin aspart  3 Units Subcutaneous TID WC  . insulin glargine  20 Units Subcutaneous QHS  . multivitamin  1 tablet Oral QHS  . mupirocin ointment  1 application Nasal BID  . pantoprazole  40 mg Oral Daily   Continuous Infusions: . sodium chloride 10 mL/hr at 12/29/19 2300   PRN Meds:.sodium chloride, calcium carbonate, cyclobenzaprine, HYDROcodone-acetaminophen, menthol-cetylpyridinium **OR** phenol, metoCLOPramide **OR** metoCLOPramide (REGLAN) injection, morphine injection, ondansetron **OR** ondansetron (ZOFRAN) IV, senna-docusate  Diet Orders (From admission, onward)    Start     Ordered   12/29/19 1621  Diet Carb Modified Fluid consistency: Thin; Room service appropriate? Yes  Diet effective now       Question Answer Comment  Calorie Level Medium 1600-2000   Fluid consistency: Thin   Room service appropriate? Yes      12/29/19 1620          DVT prophylaxis: enoxaparin (LOVENOX) injection 30 mg Start: 12/31/19 1000 SCDs Start: 12/29/19 1621     Code Status: Full Code  Family Communication: no family at bedside   Status is:  Inpatient  Remains inpatient appropriate because:Inpatient level of care appropriate due to severity of illness   Dispo: The patient is from: Home              Anticipated d/c is to: SNF              Anticipated d/c date is: 3 days              Patient currently is not medically stable to d/c.  Consultants:  Orthopedic surgery Nephrology   Procedures:  None    Microbiology  None   Antimicrobials: None     Objective: Vitals:   12/29/19 2030 12/29/19 2232 12/30/19 0237 12/30/19 0637  BP: (!) 170/61 (!) 164/61 (!) 178/64 (!) 193/76  Pulse: 81 80 82 86  Resp: 18 20  20   Temp: 98.1 F (36.7 C) 98.2 F (36.8 C) 97.8 F (36.6 C) 98.4 F (36.9 C)  TempSrc: Oral Oral Oral Oral  SpO2: 97% 97% 98% 97%  Weight:      Height:        Intake/Output Summary (Last 24 hours) at 12/30/2019 1950 Last data filed at 12/30/2019 0540 Gross per 24 hour  Intake 1410.24 ml  Output 1945 ml  Net -534.76 ml   Filed Weights   12/28/19 1047 12/28/19 1526 12/29/19 1147  Weight: 89.8 kg 88.6 kg 88.6 kg    Examination:  Constitutional: No distress, in chair Eyes: No scleral icterus ENMT: moist mucous membranes Neck: normal, supple Respiratory: Clear bilaterally without wheezing or crackles, normal respiratory effort Cardiovascular: Regular rate and rhythm, no murmurs, 1-2+ pitting lower extremity edema Abdomen: Nondistended, bowel sounds positive Musculoskeletal: no clubbing / cyanosis.  Skin: no rashes Neurologic: No focal deficits, equal strength   Data Reviewed: I have independently reviewed following labs and imaging studies   CBC: Recent Labs  Lab 12/28/19 1200 12/29/19 0402 12/29/19 1653 12/30/19 0415  WBC 17.6*  --  26.1* 21.4*  NEUTROABS 15.3*  --   --   --   HGB 6.3* 8.5* 8.6* 8.3*  HCT 20.5* 26.1* 26.9* 26.2*  MCV 87.6  --  87.9 87.6  PLT 403*  --  399 932*   Basic Metabolic Panel: Recent Labs  Lab 12/28/19 1200 12/29/19 1653 12/30/19 0415  NA 134*  --  132*  K 4.7  --  5.0  CL 103  --  103  CO2 16*  --  16*  GLUCOSE 297*  --  336*  BUN 67*  --  74*  CREATININE 6.64* 6.19* 6.24*  CALCIUM 7.8*  --  7.8*   Liver Function Tests: Recent Labs  Lab 12/30/19 0415  AST 16  ALT 15  ALKPHOS 168*  BILITOT 0.8  PROT 7.1  ALBUMIN 2.4*   Coagulation Profile: Recent Labs  Lab 12/28/19 1200  INR 1.1    HbA1C: Recent Labs    12/29/19 0402  HGBA1C 6.2*   CBG: Recent Labs  Lab 12/29/19 1359 12/29/19 1513 12/29/19 1652 12/29/19 2124 12/30/19 0835  GLUCAP 159* 192* 216* 249* 299*    Recent Results (from the past 240 hour(s))  Respiratory Panel by RT PCR (Flu A&B, Covid) - Nasopharyngeal Swab     Status: None   Collection Time: 12/28/19 12:00 PM   Specimen: Nasopharyngeal Swab  Result Value Ref Range Status   SARS Coronavirus 2 by RT PCR NEGATIVE NEGATIVE Final    Comment: (NOTE) SARS-CoV-2 target nucleic acids are NOT DETECTED.  The SARS-CoV-2 RNA is generally detectable in  upper respiratoy specimens during the acute phase of infection. The lowest concentration of SARS-CoV-2 viral copies this assay can detect is 131 copies/mL. A negative result does not preclude SARS-Cov-2 infection and should not be used as the sole basis for treatment or other patient management decisions. A negative result may occur with  improper specimen collection/handling, submission of specimen other than nasopharyngeal swab, presence of viral mutation(s) within the areas targeted by this assay, and inadequate number of viral copies (<131 copies/mL). A negative result must be combined with clinical observations, patient history, and epidemiological information. The expected result is Negative.  Fact Sheet for Patients:  PinkCheek.be  Fact Sheet for Healthcare Providers:  GravelBags.it  This test is no t yet approved or cleared by the [place] FDA and  has been authorized for detection and/or diagnosis of SARS-CoV-2 by FDA under an Emergency Use Authorization (EUA). This EUA will remain  in effect (meaning this test can be used) for the duration of the COVID-19 declaration under Section 564(b)(1) of the Act, 21 U.S.C. section 360bbb-3(b)(1), unless the authorization is terminated or revoked sooner.     Influenza A by PCR  NEGATIVE NEGATIVE Final   Influenza B by PCR NEGATIVE NEGATIVE Final    Comment: (NOTE) The Xpert Xpress SARS-CoV-2/FLU/RSV assay is intended as an aid in  the diagnosis of influenza from Nasopharyngeal swab specimens and  should not be used as a sole basis for treatment. Nasal washings and  aspirates are unacceptable for Xpert Xpress SARS-CoV-2/FLU/RSV  testing.  Fact Sheet for Patients: PinkCheek.be  Fact Sheet for Healthcare Providers: GravelBags.it  This test is not yet approved or cleared by the [place] FDA and  has been authorized for detection and/or diagnosis of SARS-CoV-2 by  FDA under an Emergency Use Authorization (EUA). This EUA will remain  in effect (meaning this test can be used) for the duration of the  Covid-19 declaration under Section 564(b)(1) of the Act, 21  U.S.C. section 360bbb-3(b)(1), unless the authorization is  terminated or revoked. Performed at [facility], [place] [address], [place], [place] [address]   Surgical pcr screen     Status: Abnormal   Collection Time: 12/28/19  4:50 PM   Specimen: Nasal Mucosa; Nasal Swab  Result Value Ref Range Status   MRSA, PCR NEGATIVE NEGATIVE Final   Staphylococcus aureus POSITIVE (A) NEGATIVE Final    Comment: (NOTE) The Xpert SA Assay (FDA approved for NASAL specimens in patients 52 years of age and older), is one component of a comprehensive surveillance program. It is not intended to diagnose infection nor to guide or monitor treatment. Performed at [facility], [place] [address] [place], [place] [address]      Radiology Studies: Pelvis Portable  Result Date: 12/29/2019 CLINICAL DATA:  ORIF left hip fracture EXAM: PORTABLE PELVIS 1-2 VIEWS COMPARISON:  12/28/2019 FINDINGS: Left femur intertrochanteric fracture fixed with compression screw and locking intramedullary rod. Intertrochanteric fracture in  satisfactory alignment. Normal left hip joint. Avulsion of the lesser trochanter. IMPRESSION: ORIF intertrochanteric fracture left femur. Electronically Signed   By: Franchot Gallo M.D.   On: 12/29/2019 15:34   DG C-Arm 1-60 Min-No Report  Result Date: 12/29/2019 Fluoroscopy was utilized by the requesting physician.  No radiographic interpretation.   DG HIP OPERATIVE UNILAT WITH PELVIS LEFT  Result Date: 12/29/2019 CLINICAL DATA:  Left hip fracture EXAM: OPERATIVE left HIP (WITH PELVIS IF PERFORMED) 4 VIEWS TECHNIQUE: Fluoroscopic spot image(s) were submitted for interpretation post-operatively. COMPARISON:  Left hip radiographs 12/28/2019 FINDINGS: Left femur intertrochanteric fracture fixed with compression screw and locking intramedullary rod. Satisfactory fracture alignment. IMPRESSION: ORIF left intertrochanteric fracture. Electronically Signed   By: Franchot Gallo M.D.   On: 12/29/2019 15:33    Marzetta Board, MD, PhD [facility]  Between 7 am - 7 pm I am available, please contact me via Amion or Securechat  Between 7 pm - 7 am I am not available, please contact night coverage MD/APP via Amion

## 2019-12-31 DIAGNOSIS — T148XXA Other injury of unspecified body region, initial encounter: Secondary | ICD-10-CM | POA: Diagnosis not present

## 2019-12-31 DIAGNOSIS — E118 Type 2 diabetes mellitus with unspecified complications: Secondary | ICD-10-CM | POA: Diagnosis not present

## 2019-12-31 DIAGNOSIS — D649 Anemia, unspecified: Secondary | ICD-10-CM | POA: Diagnosis not present

## 2019-12-31 DIAGNOSIS — N179 Acute kidney failure, unspecified: Secondary | ICD-10-CM | POA: Diagnosis not present

## 2019-12-31 DIAGNOSIS — E1165 Type 2 diabetes mellitus with hyperglycemia: Secondary | ICD-10-CM | POA: Diagnosis not present

## 2019-12-31 DIAGNOSIS — N185 Chronic kidney disease, stage 5: Secondary | ICD-10-CM | POA: Diagnosis not present

## 2019-12-31 LAB — BASIC METABOLIC PANEL
Anion gap: 12 (ref 5–15)
BUN: 76 mg/dL — ABNORMAL HIGH (ref 6–20)
CO2: 18 mmol/L — ABNORMAL LOW (ref 22–32)
Calcium: 8.3 mg/dL — ABNORMAL LOW (ref 8.9–10.3)
Chloride: 104 mmol/L (ref 98–111)
Creatinine, Ser: 5.68 mg/dL — ABNORMAL HIGH (ref 0.44–1.00)
GFR, Estimated: 8 mL/min — ABNORMAL LOW (ref >60)
Glucose, Bld: 222 mg/dL — ABNORMAL HIGH (ref 70–99)
Potassium: 4.5 mmol/L (ref 3.5–5.1)
Sodium: 134 mmol/L — ABNORMAL LOW (ref 135–145)

## 2019-12-31 LAB — CBC
HCT: 28.7 % — ABNORMAL LOW (ref 36.0–46.0)
Hemoglobin: 9 g/dL — ABNORMAL LOW (ref 12.0–15.0)
MCH: 27.4 pg (ref 26.0–34.0)
MCHC: 31.4 g/dL (ref 30.0–36.0)
MCV: 87.5 fL (ref 80.0–100.0)
Platelets: 477 10*3/uL — ABNORMAL HIGH (ref 150–400)
RBC: 3.28 MIL/uL — ABNORMAL LOW (ref 3.87–5.11)
RDW: 15.1 % (ref 11.5–15.5)
WBC: 23.6 10*3/uL — ABNORMAL HIGH (ref 4.0–10.5)
nRBC: 0 % (ref 0.0–0.2)

## 2019-12-31 LAB — GLUCOSE, CAPILLARY
Glucose-Capillary: 194 mg/dL — ABNORMAL HIGH (ref 70–99)
Glucose-Capillary: 178 mg/dL — ABNORMAL HIGH (ref 70–99)
Glucose-Capillary: 189 mg/dL — ABNORMAL HIGH (ref 70–99)
Glucose-Capillary: 154 mg/dL — ABNORMAL HIGH (ref 70–99)

## 2019-12-31 MED ORDER — CARVEDILOL 25 MG PO TABS
25.0000 mg | ORAL_TABLET | Freq: Two times a day (BID) | ORAL | Status: DC
  Administered 2019-12-31 – 2020-01-23 (×41): 25 mg via ORAL
  Filled 2019-12-31 (×26): qty 1
  Filled 2019-12-31: qty 2
  Filled 2019-12-31 (×16): qty 1

## 2019-12-31 MED ORDER — FUROSEMIDE 10 MG/ML IJ SOLN
40.0000 mg | Freq: Once | INTRAMUSCULAR | Status: AC
  Administered 2019-12-31: 40 mg via INTRAVENOUS
  Filled 2019-12-31: qty 4

## 2019-12-31 MED ORDER — HYDRALAZINE HCL 50 MG PO TABS
100.0000 mg | ORAL_TABLET | Freq: Three times a day (TID) | ORAL | Status: DC
  Administered 2019-12-31 – 2020-01-23 (×60): 100 mg via ORAL
  Filled 2019-12-31 (×64): qty 2

## 2019-12-31 NOTE — Progress Notes (Signed)
    Subjective:  Patient reports pain as mild to moderate. States pain is better today than yesterday  Denies N/V/CP/SOB.   Objective:   VITALS:   Vitals:   12/30/19 1346 12/30/19 1800 12/30/19 2116 12/31/19 0447  BP: (!) 200/70 (!) 195/64 (!) 196/64 (!) 193/70  Pulse: 79  83 84  Resp: 19  19 18   Temp: 98.4 F (36.9 C)  98 F (36.7 C) 98.4 F (36.9 C)  TempSrc: Oral  Oral Oral  SpO2: 97%  97% 97%  Weight:      Height:        NAD ABD soft Neurovascular intact Incision: dressing C/D/I   Lab Results  Component Value Date   WBC 23.6 (H) 12/31/2019   HGB 9.0 (L) 12/31/2019   HCT 28.7 (L) 12/31/2019   MCV 87.5 12/31/2019   PLT 477 (H) 12/31/2019   BMET    Component Value Date/Time   NA 134 (L) 12/31/2019 0518   NA 140 10/29/2018 1439   K 4.5 12/31/2019 0518   CL 104 12/31/2019 0518   CO2 18 (L) 12/31/2019 0518   GLUCOSE 222 (H) 12/31/2019 0518   BUN 76 (H) 12/31/2019 0518   BUN 43 (H) 10/29/2018 1439   CREATININE 5.68 (H) 12/31/2019 0518   CREATININE 1.92 (H) 01/10/2016 1528   CALCIUM 8.3 (L) 12/31/2019 0518   GFRNONAA 8 (L) 12/31/2019 0518   GFRNONAA 30 (L) 01/10/2016 1528   GFRAA 13 (L) 12/17/2018 0907   GFRAA 35 (L) 01/10/2016 1528     Assessment/Plan: 2 Days Post-Op   Principal Problem:   Fracture Active Problems:   Diabetes mellitus type 2, uncontrolled, with complications (HCC)   CKD (chronic kidney disease) stage 5, GFR less than 15 ml/min (HCC)   AKI (acute kidney injury) (HCC)   Volume overload   Acute on chronic anemia   WBAT with walker DVT ppx: Lovenox, SCDs, TEDS PO pain control PT/OT Dispo: D/C planning     Dorothyann Peng 12/31/2019, 8:47 AM   [facility] is now [address], [place], [place] [address] Phone: [phone] [email or web address]  [employer]

## 2019-12-31 NOTE — Plan of Care (Signed)
  Problem: Health Behavior/Discharge Planning: Goal: Ability to manage health-related needs will improve 12/31/2019 2211 by Hulan Fess, RN Outcome: Progressing 12/31/2019 2211 by Hulan Fess, RN Outcome: Progressing   Problem: Clinical Measurements: Goal: Ability to maintain clinical measurements within normal limits will improve 12/31/2019 2211 by Hulan Fess, RN Outcome: Progressing 12/31/2019 2211 by Hulan Fess, RN Outcome: Progressing Goal: Will remain free from infection 12/31/2019 2211 by Hulan Fess, RN Outcome: Progressing 12/31/2019 2211 by Hulan Fess, RN Outcome: Progressing Goal: Diagnostic test results will improve 12/31/2019 2211 by Hulan Fess, RN Outcome: Progressing 12/31/2019 2211 by Hulan Fess, RN Outcome: Progressing Goal: Respiratory complications will improve 12/31/2019 2211 by Hulan Fess, RN Outcome: Progressing 12/31/2019 2211 by Hulan Fess, RN Outcome: Progressing Goal: Cardiovascular complication will be avoided 12/31/2019 2211 by Hulan Fess, RN Outcome: Progressing 12/31/2019 2211 by Hulan Fess, RN Outcome: Progressing   Problem: Activity: Goal: Risk for activity intolerance will decrease 12/31/2019 2211 by Hulan Fess, RN Outcome: Progressing 12/31/2019 2211 by Hulan Fess, RN Outcome: Progressing   Problem: Nutrition: Goal: Adequate nutrition will be maintained 12/31/2019 2211 by Hulan Fess, RN Outcome: Progressing 12/31/2019 2211 by Hulan Fess, RN Outcome: Progressing   Problem: Coping: Goal: Level of anxiety will decrease 12/31/2019 2211 by Hulan Fess, RN Outcome: Progressing 12/31/2019 2211 by Hulan Fess, RN Outcome: Progressing   Problem: Elimination: Goal: Will not experience complications related to bowel motility 12/31/2019 2211 by Hulan Fess,  RN Outcome: Progressing 12/31/2019 2211 by Hulan Fess, RN Outcome: Progressing Goal: Will not experience complications related to urinary retention 12/31/2019 2211 by Hulan Fess, RN Outcome: Progressing 12/31/2019 2211 by Hulan Fess, RN Outcome: Progressing   Problem: Pain Managment: Goal: General experience of comfort will improve 12/31/2019 2211 by Hulan Fess, RN Outcome: Progressing 12/31/2019 2211 by Hulan Fess, RN Outcome: Progressing   Problem: Safety: Goal: Ability to remain free from injury will improve 12/31/2019 2211 by Hulan Fess, RN Outcome: Progressing 12/31/2019 2211 by Hulan Fess, RN Outcome: Progressing   Problem: Skin Integrity: Goal: Risk for impaired skin integrity will decrease 12/31/2019 2211 by Hulan Fess, RN Outcome: Progressing 12/31/2019 2211 by Hulan Fess, RN Outcome: Progressing   Problem: Education: Goal: Verbalization of understanding the information provided (i.e., activity precautions, restrictions, etc) will improve 12/31/2019 2211 by Hulan Fess, RN Outcome: Progressing 12/31/2019 2211 by Hulan Fess, RN Outcome: Progressing Goal: Individualized Educational Video(s) 12/31/2019 2211 by Hulan Fess, RN Outcome: Progressing 12/31/2019 2211 by Hulan Fess, RN Outcome: Progressing   Problem: Activity: Goal: Ability to ambulate and perform ADLs will improve 12/31/2019 2211 by Hulan Fess, RN Outcome: Progressing 12/31/2019 2211 by Hulan Fess, RN Outcome: Progressing   Problem: Clinical Measurements: Goal: Postoperative complications will be avoided or minimized 12/31/2019 2211 by Hulan Fess, RN Outcome: Progressing 12/31/2019 2211 by Hulan Fess, RN Outcome: Progressing   Problem: Self-Concept: Goal: Ability to maintain and perform role responsibilities to the fullest  extent possible will improve 12/31/2019 2211 by Hulan Fess, RN Outcome: Progressing 12/31/2019 2211 by Hulan Fess, RN Outcome: Progressing   Problem: Pain Management: Goal: Pain level will decrease 12/31/2019 2211 by Hulan Fess, RN Outcome: Progressing 12/31/2019 2211 by Hulan Fess, RN Outcome: Progressing

## 2019-12-31 NOTE — Progress Notes (Signed)
Inpatient Rehab Admissions Coordinator:  Consult received.  Attempted calling pt in room. No one answered phone.  Left message with her nurse via secure chat.  Awaiting response.   Gayland Curry, [place], [place] Admissions Coordinator [phone]

## 2019-12-31 NOTE — Progress Notes (Signed)
PROGRESS NOTE  NIOKA THORINGTON AOZ:[id number] DOB: 1965-07-25 DOA: 12/28/2019 PCP: Julian Hy, PA-C   LOS: 3 days   Brief Narrative / Interim history: 54 year old female with DM, HTN, CKD 5, status post left foot Chopart amputation 2014 managed by podiatry, left SFA vascularization in 2020, prior CVA who came into the hospital after a ground-level fall resulting in left hip fracture.  Orthopedic surgery has consulted and plans to operate 10/28.  Nephrology also consulted on admission due to chronic kidney disease.  Subjective / 24h Interval events: Feels better today overall.  Assessment & Plan: Principal Problem Intertrochanteric fracture of the proximal left femur with avulsion of the lesser trochanter status post mechanical fall-orthopedic surgery consulted, she is status post intramedullary fixation of the left femur by Dr. Lyla Glassing on 10/28 -PT recommended CIR, consult pending -Continue aggressive postop PT  Active Problems Acute on chronic anemia of chronic kidney disease-hemoglobin dropped to 6.3, status post 2 units of packed red blood cells.  She was given Lasix x1.  -Hemoglobin remained stable, increasing on its own, 9.0 today  Leukocytosis -Likely reactive, monitor.  She is afebrile.  Acute kidney injury on chronic kidney disease stage V -baseline creatinine around 4, her creatinine was in the six range on admission.  Nephrology evaluated patient.  Status post Lasix following the blood transfusion, 10/29 and repeat again 10/30  Volume overload with bilateral lower extremity edema-post op, redosed with Lasix today, monitor.  Improving today  Type 2 diabetes mellitus -Continue Lantus, sliding scale, CBGs overall stable  CBG (last 3)  Recent Labs    12/30/19 2114 12/31/19 0834 12/31/19 1138  GLUCAP 259* 194* 178*    Essential hypertension-continue increased doses, may need to go up even more.  Recheck blood pressure at lunch.  Patient tells me that she is  usually in the 130s at home, suspect higher values are likely in the hospital setting.  She is asymptomatic which suggests a chronic component  Scheduled Meds: . (feeding supplement) PROSource Plus  30 mL Oral TID BM  . alosetron  1 mg Oral BID  . amLODipine  10 mg Oral Daily  . atorvastatin  40 mg Oral q1800  . carvedilol  25 mg Oral BID WC  . Chlorhexidine Gluconate Cloth  6 each Topical Daily  . docusate sodium  100 mg Oral BID  . DULoxetine  60 mg Oral Daily  . enoxaparin (LOVENOX) injection  30 mg Subcutaneous Q24H  . hydrALAZINE  100 mg Oral TID  . insulin aspart  0-9 Units Subcutaneous TID WC  . insulin aspart  3 Units Subcutaneous TID WC  . insulin glargine  20 Units Subcutaneous QHS  . multivitamin  1 tablet Oral QHS  . mupirocin ointment  1 application Nasal BID  . pantoprazole  40 mg Oral Daily   Continuous Infusions: . sodium chloride 10 mL/hr at 12/29/19 2300   PRN Meds:.sodium chloride, calcium carbonate, cyclobenzaprine, hydrALAZINE, HYDROcodone-acetaminophen, menthol-cetylpyridinium **OR** phenol, metoCLOPramide **OR** metoCLOPramide (REGLAN) injection, morphine injection, ondansetron **OR** ondansetron (ZOFRAN) IV, senna-docusate  Diet Orders (From admission, onward)    Start     Ordered   12/29/19 1621  Diet Carb Modified Fluid consistency: Thin; Room service appropriate? Yes  Diet effective now       Question Answer Comment  Calorie Level Medium 1600-2000   Fluid consistency: Thin   Room service appropriate? Yes      12/29/19 1620          DVT prophylaxis: enoxaparin (LOVENOX) injection 30  mg Start: 12/31/19 1000 SCDs Start: 12/29/19 1621     Code Status: Full Code  Family Communication: no family at bedside   Status is: Inpatient  Remains inpatient appropriate because:Inpatient level of care appropriate due to severity of illness   Dispo: The patient is from: Home              Anticipated d/c is to: SNF              Anticipated d/c date is: 3  days              Patient currently is not medically stable to d/c.  Consultants:  Orthopedic surgery Nephrology   Procedures:  None   Microbiology  None   Antimicrobials: None     Objective: Vitals:   12/30/19 1346 12/30/19 1800 12/30/19 2116 12/31/19 0447  BP: (!) 200/70 (!) 195/64 (!) 196/64 (!) 193/70  Pulse: 79  83 84  Resp: 19  19 18   Temp: 98.4 F (36.9 C)  98 F (36.7 C) 98.4 F (36.9 C)  TempSrc: Oral  Oral Oral  SpO2: 97%  97% 97%  Weight:      Height:        Intake/Output Summary (Last 24 hours) at 12/31/2019 1307 Last data filed at 12/31/2019 1219 Gross per 24 hour  Intake 500 ml  Output 1101 ml  Net -601 ml   Filed Weights   12/28/19 1047 12/28/19 1526 12/29/19 1147  Weight: 89.8 kg 88.6 kg 88.6 kg    Examination:  Constitutional: No distress, in bed Eyes: No scleral icterus ENMT: mmm Neck: normal, supple Respiratory: Clear bilaterally, no wheezing, no crackles Cardiovascular: Regular rate and rhythm, no murmurs, 1+ lower extremity edema Abdomen: Soft, nondistended, nontender, bowel sounds positive Musculoskeletal: no clubbing / cyanosis.  Skin: No rashes seen Neurologic: Nonfocal, equal strength   Data Reviewed: I have independently reviewed following labs and imaging studies   CBC: Recent Labs  Lab 12/28/19 1200 12/29/19 0402 12/29/19 1653 12/30/19 0415 12/31/19 0518  WBC 17.6*  --  26.1* 21.4* 23.6*  NEUTROABS 15.3*  --   --   --   --   HGB 6.3* 8.5* 8.6* 8.3* 9.0*  HCT 20.5* 26.1* 26.9* 26.2* 28.7*  MCV 87.6  --  87.9 87.6 87.5  PLT 403*  --  399 415* 191*   Basic Metabolic Panel: Recent Labs  Lab 12/28/19 1200 12/29/19 1653 12/30/19 0415 12/31/19 0518  NA 134*  --  132* 134*  K 4.7  --  5.0 4.5  CL 103  --  103 104  CO2 16*  --  16* 18*  GLUCOSE 297*  --  336* 222*  BUN 67*  --  74* 76*  CREATININE 6.64* 6.19* 6.24* 5.68*  CALCIUM 7.8*  --  7.8* 8.3*   Liver Function Tests: Recent Labs  Lab 12/30/19 0415   AST 16  ALT 15  ALKPHOS 168*  BILITOT 0.8  PROT 7.1  ALBUMIN 2.4*   Coagulation Profile: Recent Labs  Lab 12/28/19 1200  INR 1.1   HbA1C: Recent Labs    12/29/19 0402  HGBA1C 6.2*   CBG: Recent Labs  Lab 12/30/19 1135 12/30/19 1617 12/30/19 2114 12/31/19 0834 12/31/19 1138  GLUCAP 333* 298* 259* 194* 178*    Recent Results (from the past 240 hour(s))  Respiratory Panel by RT PCR (Flu A&B, Covid) - Nasopharyngeal Swab     Status: None   Collection Time: 12/28/19 12:00 PM   Specimen:  Nasopharyngeal Swab  Result Value Ref Range Status   SARS Coronavirus 2 by RT PCR NEGATIVE NEGATIVE Final    Comment: (NOTE) SARS-CoV-2 target nucleic acids are NOT DETECTED.  The SARS-CoV-2 RNA is generally detectable in upper respiratoy specimens during the acute phase of infection. The lowest concentration of SARS-CoV-2 viral copies this assay can detect is 131 copies/mL. A negative result does not preclude SARS-Cov-2 infection and should not be used as the sole basis for treatment or other patient management decisions. A negative result may occur with  improper specimen collection/handling, submission of specimen other than nasopharyngeal swab, presence of viral mutation(s) within the areas targeted by this assay, and inadequate number of viral copies (<131 copies/mL). A negative result must be combined with clinical observations, patient history, and epidemiological information. The expected result is Negative.  Fact Sheet for Patients:  PinkCheek.be  Fact Sheet for Healthcare Providers:  GravelBags.it  This test is no t yet approved or cleared by the [place] FDA and  has been authorized for detection and/or diagnosis of SARS-CoV-2 by FDA under an Emergency Use Authorization (EUA). This EUA will remain  in effect (meaning this test can be used) for the duration of the COVID-19 declaration under Section  564(b)(1) of the Act, 21 U.S.C. section 360bbb-3(b)(1), unless the authorization is terminated or revoked sooner.     Influenza A by PCR NEGATIVE NEGATIVE Final   Influenza B by PCR NEGATIVE NEGATIVE Final    Comment: (NOTE) The Xpert Xpress SARS-CoV-2/FLU/RSV assay is intended as an aid in  the diagnosis of influenza from Nasopharyngeal swab specimens and  should not be used as a sole basis for treatment. Nasal washings and  aspirates are unacceptable for Xpert Xpress SARS-CoV-2/FLU/RSV  testing.  Fact Sheet for Patients: PinkCheek.be  Fact Sheet for Healthcare Providers: GravelBags.it  This test is not yet approved or cleared by the [place] FDA and  has been authorized for detection and/or diagnosis of SARS-CoV-2 by  FDA under an Emergency Use Authorization (EUA). This EUA will remain  in effect (meaning this test can be used) for the duration of the  Covid-19 declaration under Section 564(b)(1) of the Act, 21  U.S.C. section 360bbb-3(b)(1), unless the authorization is  terminated or revoked. Performed at [facility], [place] [address], [place], [place] [address]   Surgical pcr screen     Status: Abnormal   Collection Time: 12/28/19  4:50 PM   Specimen: Nasal Mucosa; Nasal Swab  Result Value Ref Range Status   MRSA, PCR NEGATIVE NEGATIVE Final   Staphylococcus aureus POSITIVE (A) NEGATIVE Final    Comment: (NOTE) The Xpert SA Assay (FDA approved for NASAL specimens in patients 62 years of age and older), is one component of a comprehensive surveillance program. It is not intended to diagnose infection nor to guide or monitor treatment. Performed at [facility], [place] [address], [place], [place] [address]      Radiology Studies: No results found.  Marzetta Board, MD, PhD [facility]  Between 7 am - 7 pm I am available, please contact me via Amion or  Securechat  Between 7 pm - 7 am I am not available, please contact night coverage MD/APP via Amion

## 2019-12-31 NOTE — Progress Notes (Signed)
Physical Therapy Treatment Patient Details Name: [name] MRN: [id number] DOB: 1965/07/11 Today's Date: 12/31/2019    History of Present Illness 54 year old female with DM, HTN, CKD 5, status post left foot Chopart amputation 2014 managed by podiatry, left SFA vascularization in 2020, prior CVA who came into the hospital after a ground-level fall resulting in left hip fracture,   S/P Intramedullary fixation, Left femur on 10/28.    PT Comments    Patient has had more diarrhea while sitting in recliner. Patient requires 2 max assist to stand at [facility] and having difficulty with pivot to bed leading with the left . 2 max/total assistance to return to supine, requiring support of both legs. Patient again having diarrhea after return to bed.  Follow Up Recommendations  Home health PT;CIR     Equipment Recommendations  Wheelchair (measurements PT);Wheelchair cushion (measurements PT)    Recommendations for Other Services Rehab consult     Precautions / Restrictions Precautions Precautions: Fall Precaution Comments: has been limited WB on L foot plantar due to sore, using Knee walker PTA., having diarrhea    Mobility  Bed Mobility Overal bed mobility: Needs Assistance Bed Mobility: Sit to Supine     Supine to sit: Mod assist;+2 for safety/equipment;+2 for physical assistance Sit to supine: Total assist;+2 for physical assistance;+2 for safety/equipment   General bed mobility comments: pt required assistance for both legs and trunk to return to supine.  Transfers Overall transfer level: Needs assistance Equipment used: Rolling walker (2 wheeled) Transfers: Stand Pivot Transfers;Sit to/from Stand Sit to Stand: +2 physical assistance;+2 safety/equipment;Max assist;From elevated surface Stand pivot transfers: Max assist;+2 physical assistance;+2 safety/equipment       General transfer comment: 2 max assist to rise from recliner.  Much difficulty turning on right foot and  taking a few hopping steps to bed, patient sat onto bed very near bed edge. Scooted back onto bed using UE's.  Ambulation/Gait                 Stairs             Wheelchair Mobility    Modified Rankin (Stroke Patients Only)       Balance     Sitting balance-Leahy Scale: Good     Standing balance support: Bilateral upper extremity supported;During functional activity Standing balance-Leahy Scale: Poor                              Cognition Arousal/Alertness: Awake/alert                                            Exercises      General Comments        Pertinent Vitals/Pain Faces Pain Scale: Hurts even more Pain Location: LLE, abd Pain Descriptors / Indicators: Cramping;Grimacing Pain Intervention(s): Monitored during session;Premedicated before session;Repositioned;Limited activity within patient's tolerance    Home Living                      Prior Function            PT Goals (current goals can now be found in the care plan section) Progress towards PT goals: Progressing toward goals    Frequency    Min 6X/week      PT Plan Current plan remains appropriate  Co-evaluation              AM-PAC PT "6 Clicks" Mobility   Outcome Measure  Help needed turning from your back to your side while in a flat bed without using bedrails?: Total Help needed moving from lying on your back to sitting on the side of a flat bed without using bedrails?: A Lot Help needed moving to and from a bed to a chair (including a wheelchair)?: A Lot Help needed standing up from a chair using your arms (e.g., wheelchair or bedside chair)?: Total Help needed to walk in hospital room?: Total Help needed climbing 3-5 steps with a railing? : Total 6 Click Score: 8    End of Session Equipment Utilized During Treatment: Gait belt Activity Tolerance: Patient limited by pain;Treatment limited secondary to medical  complications (Comment) Patient left: in bed;with call bell/phone within reach Nurse Communication: Mobility status;Need for lift equipment [name] STEDy can be useful) PT Visit Diagnosis: Difficulty in walking, not elsewhere classified (R26.2);Pain Pain - Right/Left: Left Pain - part of body: Hip     Time: 1515-1550 PT Time Calculation (min) (ACUTE ONLY): 35 min  Charges:  $Therapeutic Activity: 23-37 mins                     [name] PT Acute Rehabilitation Services Pager [phone] Office [phone]    [name] 12/31/2019, 4:17 PM

## 2019-12-31 NOTE — Progress Notes (Signed)
Physical Therapy Treatment Patient Details Name: [name] MRN: [id number] DOB: [birth year] Today's Date: 12/31/2019    History of Present Illness 54 year old female with DM, HTN, CKD 5, status post left foot Chopart amputation 2014 managed by podiatry, left SFA vascularization in 2020, prior CVA who came into the hospital after a ground-level fall resulting in left hip fracture,   S/P Intramedullary fixation, Left femur on 10/28.    PT Comments    Patient  In bed, has had diarrhea.  Max assist to sit  Up onto bed edge, assisting with right leg.  Patient required 2 max assist to stand from raised bed at [facility], a 80rd  Person assisted with hygiene. Patient  Able to take a few very small steps to turn to recliner. After sitting down, patient reported another bout of diarrhea.  Patient will benefit from CIR.   Follow Up Recommendations  Home health PT;CIR     Equipment Recommendations  Wheelchair (measurements PT);Wheelchair cushion (measurements PT)    Recommendations for Other Services Rehab consult     Precautions / Restrictions Precautions Precaution Comments: has been limited WB on L foot plantar due to sore, using Knee walker PTA., having diarrhea    Mobility  Bed Mobility Overal bed mobility: Needs Assistance Bed Mobility: Sit to Supine     Supine to sit: Mod assist;+2 for safety/equipment;+2 for physical assistance     General bed mobility comments: patient moved right leg, assisted with left leg to bed edge, patient used rail to pull self upright, able to scoot to bed edge using BUE's  Transfers Overall transfer level: Needs assistance Equipment used: Rolling walker (2 wheeled) Transfers: Stand Pivot Transfers Sit to Stand: +2 physical assistance;+2 safety/equipment;Max assist;From elevated surface         General transfer comment: bed raised. @ max to rise at [facility]. Stood with 2 assist while 3rd person cleaned patient post BM.Able to take very small hop step to  turn to recliner. Cues for hand placement  to lower to recliner.  Ambulation/Gait                 Stairs             Wheelchair Mobility    Modified Rankin (Stroke Patients Only)       Balance     Sitting balance-Leahy Scale: Good     Standing balance support: Bilateral upper extremity supported;During functional activity Standing balance-Leahy Scale: Poor                              Cognition Arousal/Alertness: Awake/alert                                            Exercises      General Comments        Pertinent Vitals/Pain Faces Pain Scale: Hurts even more Pain Location: LLE, abd Pain Descriptors / Indicators: Cramping;Grimacing Pain Intervention(s): Premedicated before session;Monitored during session;Limited activity within patient's tolerance    Home Living                      Prior Function            PT Goals (current goals can now be found in the care plan section) Progress towards PT goals: Progressing toward goals  Frequency    Min 6X/week      PT Plan Current plan remains appropriate    Co-evaluation              AM-PAC PT "6 Clicks" Mobility   Outcome Measure  Help needed turning from your back to your side while in a flat bed without using bedrails?: A Lot Help needed moving from lying on your back to sitting on the side of a flat bed without using bedrails?: A Lot Help needed moving to and from a bed to a chair (including a wheelchair)?: Total Help needed standing up from a chair using your arms (e.g., wheelchair or bedside chair)?: Total Help needed to walk in hospital room?: Total Help needed climbing 3-5 steps with a railing? : Total 6 Click Score: 8    End of Session Equipment Utilized During Treatment: Gait belt Activity Tolerance: Patient limited by pain;Treatment limited secondary to medical complications (Comment) (diarrhea) Patient left: in chair;with call  bell/phone within reach Nurse Communication: Mobility status PT Visit Diagnosis: Difficulty in walking, not elsewhere classified (R26.2);Pain Pain - Right/Left: Left Pain - part of body: Hip     Time: 6015-6153 PT Time Calculation (min) (ACUTE ONLY): 29 min  Charges:  $Therapeutic Activity: 23-37 mins                     [name] PT Acute Rehabilitation Services Pager [phone] Office [phone]    [name] 12/31/2019, 4:09 PM

## 2020-01-01 DIAGNOSIS — N179 Acute kidney failure, unspecified: Secondary | ICD-10-CM | POA: Diagnosis not present

## 2020-01-01 DIAGNOSIS — E1165 Type 2 diabetes mellitus with hyperglycemia: Secondary | ICD-10-CM | POA: Diagnosis not present

## 2020-01-01 DIAGNOSIS — N185 Chronic kidney disease, stage 5: Secondary | ICD-10-CM | POA: Diagnosis not present

## 2020-01-01 DIAGNOSIS — E118 Type 2 diabetes mellitus with unspecified complications: Secondary | ICD-10-CM | POA: Diagnosis not present

## 2020-01-01 DIAGNOSIS — T148XXA Other injury of unspecified body region, initial encounter: Secondary | ICD-10-CM | POA: Diagnosis not present

## 2020-01-01 DIAGNOSIS — D649 Anemia, unspecified: Secondary | ICD-10-CM | POA: Diagnosis not present

## 2020-01-01 LAB — BASIC METABOLIC PANEL
Anion gap: 12 (ref 5–15)
BUN: 89 mg/dL — ABNORMAL HIGH (ref 6–20)
CO2: 16 mmol/L — ABNORMAL LOW (ref 22–32)
Calcium: 8.1 mg/dL — ABNORMAL LOW (ref 8.9–10.3)
Chloride: 106 mmol/L (ref 98–111)
Creatinine, Ser: 5.98 mg/dL — ABNORMAL HIGH (ref 0.44–1.00)
GFR, Estimated: 8 mL/min — ABNORMAL LOW (ref >60)
Glucose, Bld: 139 mg/dL — ABNORMAL HIGH (ref 70–99)
Potassium: 4.4 mmol/L (ref 3.5–5.1)
Sodium: 134 mmol/L — ABNORMAL LOW (ref 135–145)

## 2020-01-01 LAB — BPAM RBC
Blood Product Expiration Date: 202111192359
Blood Product Expiration Date: 202111192359
Blood Product Expiration Date: 202111192359
Blood Product Expiration Date: 202111192359
ISSUE DATE / TIME: 202110271840
ISSUE DATE / TIME: 202110272209
Unit Type and Rh: 6200
Unit Type and Rh: 6200
Unit Type and Rh: 6200
Unit Type and Rh: 6200

## 2020-01-01 LAB — TYPE AND SCREEN
ABO/RH(D): A POS
Antibody Screen: NEGATIVE
Unit division: 0
Unit division: 0
Unit division: 0
Unit division: 0

## 2020-01-01 LAB — CBC
HCT: 25.7 % — ABNORMAL LOW (ref 36.0–46.0)
Hemoglobin: 8.1 g/dL — ABNORMAL LOW (ref 12.0–15.0)
MCH: 27.9 pg (ref 26.0–34.0)
MCHC: 31.5 g/dL (ref 30.0–36.0)
MCV: 88.6 fL (ref 80.0–100.0)
Platelets: 376 10*3/uL (ref 150–400)
RBC: 2.9 MIL/uL — ABNORMAL LOW (ref 3.87–5.11)
RDW: 15.4 % (ref 11.5–15.5)
WBC: 16 10*3/uL — ABNORMAL HIGH (ref 4.0–10.5)
nRBC: 0 % (ref 0.0–0.2)

## 2020-01-01 LAB — GLUCOSE, CAPILLARY
Glucose-Capillary: 122 mg/dL — ABNORMAL HIGH (ref 70–99)
Glucose-Capillary: 177 mg/dL — ABNORMAL HIGH (ref 70–99)
Glucose-Capillary: 218 mg/dL — ABNORMAL HIGH (ref 70–99)
Glucose-Capillary: 248 mg/dL — ABNORMAL HIGH (ref 70–99)

## 2020-01-01 NOTE — Progress Notes (Signed)
Subjective: 3 Days Post-Op Procedure(s) (LRB): INTRAMEDULLARY (IM) NAIL INTERTROCHANTRIC (Left)  Patient reports pain as mild to moderate.  Tolerating POs well.  Admits to flatus.  Resting comfortably in bed.  Objective:   VITALS:  Temp:  [97.8 F (36.6 C)-98 F (36.7 C)] 98 F (36.7 C) (10/30 2129) Pulse Rate:  [70-74] 74 (10/31 0532) Resp:  [17-18] 17 (10/31 0532) BP: (140-161)/(54-73) 140/57 (10/31 0532) SpO2:  [94 %-99 %] 96 % (10/31 0532)  General: WDWN patient in NAD. Psych:  Appropriate mood and affect. Neuro:  A&O x 3, Moving all extremities, sensation intact to light touch HEENT:  EOMs intact Chest:  Even non-labored respirations Skin:  Dressing C/D/I, no rashes or lesions Extremities: warm/dry, mild edema to L hip, no erythema or echymosis.  No lymphadenopathy. Pulses: Popliteus 2+ MSK:  ROM: TKE, MMT: able to perform quad set    LABS Recent Labs    12/29/19 1653 12/29/19 1653 12/30/19 0415 12/30/19 0415 12/31/19 0518 01/01/20 0612  HGB 8.6*  --  8.3*  --  9.0* 8.1*  WBC 26.1*   < > 21.4*   < > 23.6* 16.0*  PLT 399   < > 415*   < > 477* 376   < > = values in this interval not displayed.   Recent Labs    12/31/19 0518 01/01/20 0612  NA 134* 134*  K 4.5 4.4  CL 104 106  CO2 18* 16*  BUN 76* 89*  CREATININE 5.68* 5.98*  GLUCOSE 222* 139*   No results for input(s): LABPT, INR in the last 72 hours.   Assessment/Plan: 3 Days Post-Op Procedure(s) (LRB): INTRAMEDULLARY (IM) NAIL INTERTROCHANTRIC (Left)  Patient seen in rounds for Dr. Lyla Glassing WBAT L LE with walker DVT ppx:  Lovenox, SCDs, TEDS Up with therapy Disp: pending, PT recommends HHPT vs CIR Plan for outpatient po visit with Dr. Lyla Glassing.  Mechele Claude PA-C [facility] Office:  [phone]

## 2020-01-01 NOTE — Progress Notes (Signed)
Inpatient Rehab Admissions:  Inpatient Rehab Consult received.  I called pt's room at [facility]  for rehabilitation assessment and to discuss goals and expectations of an inpatient rehab admission. Pt stated that she is not sure if she would like to pursue CIR.  She would like an AC to follow up with her tomorrow.  Signed: Gayland Curry, [place], [place] Admissions Coordinator [phone]

## 2020-01-01 NOTE — Progress Notes (Signed)
PROGRESS NOTE  [name] EXH:[id number] DOB: 07-06-1965 DOA: 12/28/2019 PCP: Julian Hy, PA-C   LOS: 4 days   Brief Narrative / Interim history: 54 year old female with DM, HTN, CKD 5, status post left foot Chopart amputation 2014 managed by podiatry, left SFA vascularization in 2020, prior CVA who came into the hospital after a ground-level fall resulting in left hip fracture.  Orthopedic surgery has consulted and plans to operate 10/28.  Nephrology also consulted on admission due to chronic kidney disease.  Subjective / 24h Interval events: No complaints this morning, considering CIR but not sure  Assessment & Plan: Principal Problem Intertrochanteric fracture of the proximal left femur with avulsion of the lesser trochanter status post mechanical fall-orthopedic surgery consulted, she is status post intramedullary fixation of the left femur by Dr. Lyla Glassing on 10/28 -PT recommended CIR, consult pending, AC will follow up again with the patient tomorrow -Continue aggressive postop PT  Active Problems Acute on chronic anemia of chronic kidney disease-hemoglobin dropped to 6.3, status post 2 units of packed red blood cells.  She was given Lasix x1.  -Hemoglobin remains stable, no bleeding  Leukocytosis -Likely reactive, improving today  Acute kidney injury on chronic kidney disease stage V -baseline creatinine around 4, her creatinine was in the six range on admission.  Nephrology evaluated patient.  Due to lower extremity swelling she was diuresed with Lasix x2, 10/29-30.  Creatinine slightly up today, hold  Volume overload with bilateral lower extremity edema-improving with 2 doses of Lasix.  Hold further diuresis  Type 2 diabetes mellitus -Continue Lantus, sliding scale, CBGs stable today  CBG (last 3)  Recent Labs    12/31/19 1753 12/31/19 2127 01/01/20 0746  GLUCAP 189* 154* 122*    Essential hypertension-required up titration of her antihypertensive  regimen for couple days neuro, finally blood pressure better  Scheduled Meds: . (feeding supplement) PROSource Plus  30 mL Oral TID BM  . alosetron  1 mg Oral BID  . amLODipine  10 mg Oral Daily  . atorvastatin  40 mg Oral q1800  . carvedilol  25 mg Oral BID WC  . Chlorhexidine Gluconate Cloth  6 each Topical Daily  . docusate sodium  100 mg Oral BID  . DULoxetine  60 mg Oral Daily  . enoxaparin (LOVENOX) injection  30 mg Subcutaneous Q24H  . hydrALAZINE  100 mg Oral TID  . insulin aspart  0-9 Units Subcutaneous TID WC  . insulin aspart  3 Units Subcutaneous TID WC  . insulin glargine  20 Units Subcutaneous QHS  . multivitamin  1 tablet Oral QHS  . mupirocin ointment  1 application Nasal BID  . pantoprazole  40 mg Oral Daily   Continuous Infusions: . sodium chloride 10 mL/hr at 12/29/19 2300   PRN Meds:.sodium chloride, calcium carbonate, cyclobenzaprine, hydrALAZINE, HYDROcodone-acetaminophen, menthol-cetylpyridinium **OR** phenol, metoCLOPramide **OR** metoCLOPramide (REGLAN) injection, morphine injection, ondansetron **OR** ondansetron (ZOFRAN) IV, senna-docusate  Diet Orders (From admission, onward)    Start     Ordered   12/29/19 1621  Diet Carb Modified Fluid consistency: Thin; Room service appropriate? Yes  Diet effective now       Question Answer Comment  Calorie Level Medium 1600-2000   Fluid consistency: Thin   Room service appropriate? Yes      12/29/19 1620          DVT prophylaxis: enoxaparin (LOVENOX) injection 30 mg Start: 12/31/19 1000 SCDs Start: 12/29/19 1621     Code Status: Full Code  Family Communication:  no family at bedside   Status is: Inpatient  Remains inpatient appropriate because:Inpatient level of care appropriate due to severity of illness   Dispo: The patient is from: Home              Anticipated d/c is to: SNF              Anticipated d/c date is: 3 days              Patient currently is not medically stable to  d/c.  Consultants:  Orthopedic surgery Nephrology   Procedures:  None   Microbiology  None   Antimicrobials: None     Objective: Vitals:   12/31/19 1318 12/31/19 1348 12/31/19 2129 01/01/20 0532  BP: (!) 161/73 (!) 150/64 (!) 142/54 (!) 140/57  Pulse: 73 72 70 74  Resp:  18  17  Temp:  97.8 F (36.6 C) 98 F (36.7 C)   TempSrc:  Oral Oral   SpO2:  99% 94% 96%  Weight:      Height:        Intake/Output Summary (Last 24 hours) at 01/01/2020 1005 Last data filed at 01/01/2020 0600 Gross per 24 hour  Intake 360 ml  Output 252 ml  Net 108 ml   Filed Weights   12/28/19 1047 12/28/19 1526 12/29/19 1147  Weight: 89.8 kg 88.6 kg 88.6 kg    Examination:  Constitutional: NAD, in bed Eyes: No icterus ENMT: mmm Neck: normal, supple Respiratory: Lungs are clear bilaterally without wheezing or crackles Cardiovascular: Regular rate and rhythm, no murmurs, trace edema Abdomen: Nondistended, bowel sounds positive Musculoskeletal: no clubbing / cyanosis.  Skin: No rashes Neurologic: No focal deficits   Data Reviewed: I have independently reviewed following labs and imaging studies   CBC: Recent Labs  Lab 12/28/19 1200 12/28/19 1200 12/29/19 0402 12/29/19 1653 12/30/19 0415 12/31/19 0518 01/01/20 0612  WBC 17.6*  --   --  26.1* 21.4* 23.6* 16.0*  NEUTROABS 15.3*  --   --   --   --   --   --   HGB 6.3*   < > 8.5* 8.6* 8.3* 9.0* 8.1*  HCT 20.5*   < > 26.1* 26.9* 26.2* 28.7* 25.7*  MCV 87.6  --   --  87.9 87.6 87.5 88.6  PLT 403*  --   --  399 415* 477* 376   < > = values in this interval not displayed.   Basic Metabolic Panel: Recent Labs  Lab 12/28/19 1200 12/29/19 1653 12/30/19 0415 12/31/19 0518 01/01/20 0612  NA 134*  --  132* 134* 134*  K 4.7  --  5.0 4.5 4.4  CL 103  --  103 104 106  CO2 16*  --  16* 18* 16*  GLUCOSE 297*  --  336* 222* 139*  BUN 67*  --  74* 76* 89*  CREATININE 6.64* 6.19* 6.24* 5.68* 5.98*  CALCIUM 7.8*  --  7.8* 8.3* 8.1*    Liver Function Tests: Recent Labs  Lab 12/30/19 0415  AST 16  ALT 15  ALKPHOS 168*  BILITOT 0.8  PROT 7.1  ALBUMIN 2.4*   Coagulation Profile: Recent Labs  Lab 12/28/19 1200  INR 1.1   HbA1C: No results for input(s): HGBA1C in the last 72 hours. CBG: Recent Labs  Lab 12/31/19 0834 12/31/19 1138 12/31/19 1753 12/31/19 2127 01/01/20 0746  GLUCAP 194* 178* 189* 154* 122*    Recent Results (from the past 240 hour(s))  Respiratory Panel by  RT PCR (Flu A&B, Covid) - Nasopharyngeal Swab     Status: None   Collection Time: 12/28/19 12:00 PM   Specimen: Nasopharyngeal Swab  Result Value Ref Range Status   SARS Coronavirus 2 by RT PCR NEGATIVE NEGATIVE Final    Comment: (NOTE) SARS-CoV-2 target nucleic acids are NOT DETECTED.  The SARS-CoV-2 RNA is generally detectable in upper respiratoy specimens during the acute phase of infection. The lowest concentration of SARS-CoV-2 viral copies this assay can detect is 131 copies/mL. A negative result does not preclude SARS-Cov-2 infection and should not be used as the sole basis for treatment or other patient management decisions. A negative result may occur with  improper specimen collection/handling, submission of specimen other than nasopharyngeal swab, presence of viral mutation(s) within the areas targeted by this assay, and inadequate number of viral copies (<131 copies/mL). A negative result must be combined with clinical observations, patient history, and epidemiological information. The expected result is Negative.  Fact Sheet for Patients:  PinkCheek.be  Fact Sheet for Healthcare Providers:  GravelBags.it  This test is no t yet approved or cleared by the [place] FDA and  has been authorized for detection and/or diagnosis of SARS-CoV-2 by FDA under an Emergency Use Authorization (EUA). This EUA will remain  in effect (meaning this test can be used)  for the duration of the COVID-19 declaration under Section 564(b)(1) of the Act, 21 U.S.C. section 360bbb-3(b)(1), unless the authorization is terminated or revoked sooner.     Influenza A by PCR NEGATIVE NEGATIVE Final   Influenza B by PCR NEGATIVE NEGATIVE Final    Comment: (NOTE) The Xpert Xpress SARS-CoV-2/FLU/RSV assay is intended as an aid in  the diagnosis of influenza from Nasopharyngeal swab specimens and  should not be used as a sole basis for treatment. Nasal washings and  aspirates are unacceptable for Xpert Xpress SARS-CoV-2/FLU/RSV  testing.  Fact Sheet for Patients: PinkCheek.be  Fact Sheet for Healthcare Providers: GravelBags.it  This test is not yet approved or cleared by the [place] FDA and  has been authorized for detection and/or diagnosis of SARS-CoV-2 by  FDA under an Emergency Use Authorization (EUA). This EUA will remain  in effect (meaning this test can be used) for the duration of the  Covid-19 declaration under Section 564(b)(1) of the Act, 21  U.S.C. section 360bbb-3(b)(1), unless the authorization is  terminated or revoked. Performed at [facility], [place] [address], [place], [place] [address]   Surgical pcr screen     Status: Abnormal   Collection Time: 12/28/19  4:50 PM   Specimen: Nasal Mucosa; Nasal Swab  Result Value Ref Range Status   MRSA, PCR NEGATIVE NEGATIVE Final   Staphylococcus aureus POSITIVE (A) NEGATIVE Final    Comment: (NOTE) The Xpert SA Assay (FDA approved for NASAL specimens in patients 61 years of age and older), is one component of a comprehensive surveillance program. It is not intended to diagnose infection nor to guide or monitor treatment. Performed at [facility], [place] [address] [place], [place] [address]      Radiology Studies: No results found.  Marzetta Board, MD, PhD [facility]  Between 7 am - 7  pm I am available, please contact me via Amion or Securechat  Between 7 pm - 7 am I am not available, please contact night coverage MD/APP via Amion

## 2020-01-01 NOTE — Progress Notes (Signed)
Physical Therapy Treatment Patient Details Name: [name] MRN: [id number] DOB: 1965-08-02 Today's Date: 01/01/2020    History of Present Illness 54 year old female with DM, HTN, CKD 5, status post left foot Chopart amputation 2014 managed by podiatry, left SFA vascularization in 2020, prior CVA who came into the hospital after a ground-level fall resulting in left hip fracture,   S/P Intramedullary fixation, Left femur on 10/28.    PT Comments    Pt assisted with hygiene due to diarrhea in bed on arrival.  Pt fatigued and required encouragement however agreeable to perform OOB to recliner (lunch arrived).  Pt requiring at least mod assist for mobility transfers at this time.   Follow Up Recommendations  CIR;Home health PT;Supervision/Assistance - 24 hour     Equipment Recommendations  Wheelchair (measurements PT);Wheelchair cushion (measurements PT)    Recommendations for Other Services       Precautions / Restrictions Precautions Precautions: Fall Precaution Comments: has been limited WB on L foot plantar due to wound, using Knee walker PTA., having diarrhea Restrictions Other Position/Activity Restrictions: WBAT on Left hip but has been essentially NWB on L plantar PTA    Mobility  Bed Mobility Overal bed mobility: Needs Assistance Bed Mobility: Rolling;Sidelying to Sit Rolling: Min assist   Supine to sit: Mod assist     General bed mobility comments: RN in to assist with pericare and rolling prior to mobilizing due to pt with diarrhea upon arrival (improved today per RN), assist mostly due to pain in L LE, pt attempting to assist as able  Transfers Overall transfer level: Needs assistance Equipment used: Rolling walker (2 wheeled) Transfers: Sit to/from [facility] Sit to Stand: +2 physical assistance;From elevated surface;Mod assist Stand pivot transfers: Mod assist;+2 physical assistance       General transfer comment: assist to rise and  steady, pt able to stand for approx one minute, cues for using UEs more for support due to pain, pt encouraged to transfer to recliner, cues for pivoting right foot  Ambulation/Gait                 Stairs             Wheelchair Mobility    Modified Rankin (Stroke Patients Only)       Balance Overall balance assessment: Needs assistance Sitting-balance support: Feet supported;No upper extremity supported Sitting balance-Leahy Scale: Good     Standing balance support: Bilateral upper extremity supported;During functional activity Standing balance-Leahy Scale: Poor Standing balance comment: reliant on UEs                            Cognition Arousal/Alertness: Awake/alert Behavior During Therapy: WFL for tasks assessed/performed;Flat affect Overall Cognitive Status: Within Functional Limits for tasks assessed                                        Exercises      General Comments        Pertinent Vitals/Pain Pain Assessment: 0-10 Pain Score: 10-Worst pain ever Pain Location: LLE Pain Descriptors / Indicators: Guarding;Grimacing;Sore Pain Intervention(s): Repositioned;RN gave pain meds during session;Monitored during session    Home Living                      Prior Function  PT Goals (current goals can now be found in the care plan section) Progress towards PT goals: Progressing toward goals    Frequency    Min 6X/week      PT Plan Current plan remains appropriate    Co-evaluation              AM-PAC PT "6 Clicks" Mobility   Outcome Measure  Help needed turning from your back to your side while in a flat bed without using bedrails?: A Lot Help needed moving from lying on your back to sitting on the side of a flat bed without using bedrails?: A Lot Help needed moving to and from a bed to a chair (including a wheelchair)?: A Lot Help needed standing up from a chair using your arms  (e.g., wheelchair or bedside chair)?: A Lot Help needed to walk in hospital room?: Total Help needed climbing 3-5 steps with a railing? : Total 6 Click Score: 10    End of Session Equipment Utilized During Treatment: Gait belt Activity Tolerance: Patient limited by pain Patient left: in chair;with call bell/phone within reach;with chair alarm set Nurse Communication: Mobility status PT Visit Diagnosis: Difficulty in walking, not elsewhere classified (R26.2);Pain Pain - Right/Left: Left Pain - part of body: Hip     Time: 1351-1416 PT Time Calculation (min) (ACUTE ONLY): 25 min  Charges:  $Therapeutic Activity: 23-37 mins                     Jannette Spanner PT, DPT Acute Rehabilitation Services Pager: [phone] Office: [phone]  York Ram E 01/01/2020, 3:50 PM

## 2020-01-02 ENCOUNTER — Inpatient Hospital Stay (HOSPITAL_COMMUNITY): Admission: RE | Admit: 2020-01-02 | Payer: Medicaid Other | Source: Ambulatory Visit

## 2020-01-02 DIAGNOSIS — T148XXA Other injury of unspecified body region, initial encounter: Secondary | ICD-10-CM | POA: Diagnosis not present

## 2020-01-02 DIAGNOSIS — N185 Chronic kidney disease, stage 5: Secondary | ICD-10-CM | POA: Diagnosis not present

## 2020-01-02 DIAGNOSIS — N179 Acute kidney failure, unspecified: Secondary | ICD-10-CM | POA: Diagnosis not present

## 2020-01-02 DIAGNOSIS — D649 Anemia, unspecified: Secondary | ICD-10-CM | POA: Diagnosis not present

## 2020-01-02 DIAGNOSIS — E118 Type 2 diabetes mellitus with unspecified complications: Secondary | ICD-10-CM | POA: Diagnosis not present

## 2020-01-02 DIAGNOSIS — E1165 Type 2 diabetes mellitus with hyperglycemia: Secondary | ICD-10-CM | POA: Diagnosis not present

## 2020-01-02 LAB — BASIC METABOLIC PANEL
Anion gap: 11 (ref 5–15)
BUN: 87 mg/dL — ABNORMAL HIGH (ref 6–20)
CO2: 15 mmol/L — ABNORMAL LOW (ref 22–32)
Calcium: 7.9 mg/dL — ABNORMAL LOW (ref 8.9–10.3)
Chloride: 105 mmol/L (ref 98–111)
Creatinine, Ser: 6.17 mg/dL — ABNORMAL HIGH (ref 0.44–1.00)
GFR, Estimated: 8 mL/min — ABNORMAL LOW (ref >60)
Glucose, Bld: 171 mg/dL — ABNORMAL HIGH (ref 70–99)
Potassium: 4.8 mmol/L (ref 3.5–5.1)
Sodium: 131 mmol/L — ABNORMAL LOW (ref 135–145)

## 2020-01-02 LAB — GLUCOSE, CAPILLARY
Glucose-Capillary: 163 mg/dL — ABNORMAL HIGH (ref 70–99)
Glucose-Capillary: 176 mg/dL — ABNORMAL HIGH (ref 70–99)
Glucose-Capillary: 156 mg/dL — ABNORMAL HIGH (ref 70–99)
Glucose-Capillary: 158 mg/dL — ABNORMAL HIGH (ref 70–99)

## 2020-01-02 LAB — CBC
HCT: 24.3 % — ABNORMAL LOW (ref 36.0–46.0)
Hemoglobin: 7.5 g/dL — ABNORMAL LOW (ref 12.0–15.0)
MCH: 27.6 pg (ref 26.0–34.0)
MCHC: 30.9 g/dL (ref 30.0–36.0)
MCV: 89.3 fL (ref 80.0–100.0)
Platelets: 324 10*3/uL (ref 150–400)
RBC: 2.72 MIL/uL — ABNORMAL LOW (ref 3.87–5.11)
RDW: 15.2 % (ref 11.5–15.5)
WBC: 17.6 10*3/uL — ABNORMAL HIGH (ref 4.0–10.5)
nRBC: 0 % (ref 0.0–0.2)

## 2020-01-02 MED ORDER — ASPIRIN 81 MG PO CHEW: 81.0000 mg | CHEWABLE_TABLET | Freq: Two times a day (BID) | ORAL | 0 refills | Status: DC

## 2020-01-02 MED ORDER — HYDROCODONE-ACETAMINOPHEN 5-325 MG PO TABS
1.0000 | ORAL_TABLET | Freq: Four times a day (QID) | ORAL | 0 refills | Status: DC | PRN
Start: 2020-01-02 — End: 2020-01-23

## 2020-01-02 NOTE — Progress Notes (Signed)
Inpatient Rehabilitation Admissions Coordinator  Placed call to patient to follow up on discharge disposition preference. She ask  that I call her back today after 12.  Danne Baxter, RN, MSN Rehab Admissions Coordinator [phone] 01/02/2020 9:57 AM

## 2020-01-02 NOTE — Consult Note (Signed)
Physical Medicine and Rehabilitation Consult Reason for Consult: Impaired mobility and ADLs s/p L femur fracture Referring Physician: Marzetta Board, MD   HPI: [name] is a 54 y.o. female with PMH of DM, HTN, CKD 5, s/p left foot Chopart amputation 2014, lft SFA vascularization ins 2020, prior CVA, who was admitted following a ground-level fall resulting in femur fracture, currently post-op day 4 from left fracture s/p intramedullary fixation on 10/28. Healing well. She currently has diarrhea which is improving. She lives with her daughter who works during the day. Physical Medicine & Rehabilitation was consulted to assess candidacy for CIR. Therapy notes reviewed and patient has not yet ambulated with therapy. Patient says she has only been able to ambulate to chair thus far. Pain is controlled at rest with medications but worse with therapy. Sleeping poorly due to frequent awakenings at night.   Review of Systems  Constitutional: Positive for malaise/fatigue. Negative for fever.  HENT: Negative.   Eyes: Negative.   Respiratory: Negative.   Cardiovascular: Negative.  Negative for chest pain.  Gastrointestinal: Positive for diarrhea. Negative for constipation.  Genitourinary: Negative.   Musculoskeletal: Negative.        +surgical site pain  Skin: Negative.   Neurological: Positive for focal weakness. Negative for dizziness.  Endo/Heme/Allergies: Negative.   Psychiatric/Behavioral: Negative.    Past Medical History:  Diagnosis Date  . Anxiety 2002  . Chest tightness   . Depression 2001  . Diabetes type 1, uncontrolled ([place])    at age 78  . Diabetic neuropathy ([place])   . Essential hypertension 2015  . GERD (gastroesophageal reflux disease)    about age of 69  . Nausea and vomiting in adult   . Stroke ([place])   . Urinary frequency   . Yeast vaginitis    Past Surgical History:  Procedure Laterality Date  . ACHILLES TENDON SURGERY Left 03/10/2013   Procedure: LEFT  CHOPART AMPUTATION/ LEFT TENDON ACHILLES [place];  Surgeon: Wylene Simmer, MD;  Location: [facility];  Service: Orthopedics;  Laterality: Left;  . AMPUTATION Left 06/15/2012   Procedure: AMPUTATION DIGIT;  Surgeon: Meredith Pel, MD;  Location: [facility];  Service: Orthopedics;  Laterality: Left;  Left great toe revision amputation  . ENDOMETRIAL ABLATION    . INTRAMEDULLARY (IM) NAIL INTERTROCHANTERIC Left 12/29/2019   Procedure: INTRAMEDULLARY (IM) NAIL INTERTROCHANTRIC;  Surgeon: Rod Can, MD;  Location: [facility];  Service: Orthopedics;  Laterality: Left;  . IR ANGIOGRAM EXTREMITY LEFT  12/17/2018  . IR FEM POP ART ATHERECT INC PTA MOD SED  12/17/2018  . IR RADIOLOGIST EVAL & MGMT  11/30/2018  . IR RADIOLOGIST EVAL & MGMT  12/09/2018  . IR RADIOLOGIST EVAL & MGMT  02/08/2019  . IR US GUIDE VASC ACCESS RIGHT  12/17/2018  . LAPAROSCOPIC CHOLECYSTECTOMY  01/30/2015   [facility] day surgery    Family History  Problem Relation Age of Onset  . Diabetes Mother   . Heart attack Mother   . Heart disease Mother        before age 76  . Hypertension Mother   . Hyperlipidemia Mother   . Diabetes Father   . Heart attack Father   . Heart disease Father        before age 31  . Diabetes Sister   . Hypertension Sister    Social History:  reports that she has been smoking cigarettes. She has a 5.50 pack-year smoking history. She has never used smokeless tobacco. She  reports current alcohol use. She reports that she does not use drugs. Allergies:  Allergies  Allergen Reactions  . Lidocaine Itching  . Trazodone Swelling  . Latex Rash   Medications Prior to Admission  Medication Sig Dispense Refill  . acetaminophen (TYLENOL) 500 MG tablet Take 1,000 mg by mouth every 6 (six) hours as needed for mild pain, fever or headache.    . alosetron (LOTRONEX) 1 MG tablet Take 1 mg by mouth 2 (two) times daily.    Marland Kitchen amLODipine (NORVASC) 5 MG tablet Take 1 tablet (5 mg total) by mouth daily. 30 tablet 5  .  aspirin EC 81 MG EC tablet Take 1 tablet (81 mg total) by mouth daily. 30 tablet 0  . atorvastatin (LIPITOR) 40 MG tablet TAKE 1 TABLET (40 MG TOTAL) BY MOUTH DAILY AT 6 PM. 30 tablet 1  . carvedilol (COREG) 6.25 MG tablet Take 6.25 mg by mouth 2 (two) times daily.    Marland Kitchen CINNAMON PO Take 1 tablet by mouth daily.    . cyclobenzaprine (FLEXERIL) 10 MG tablet TAKE 1 TABLET AT BEDTIME AS NEEDED FOR MUSCLE SPASM (Patient taking differently: Take 10 mg by mouth at bedtime as needed for muscle spasms. ) 30 tablet 5  . DEXILANT 60 MG capsule Take 60 mg by mouth daily.  6  . DULoxetine (CYMBALTA) 60 MG capsule TAKE 1 CAPSULE BY MOUTH EVERY DAY. Please make PCP appointment. (Patient taking differently: Take 60 mg by mouth daily. ) 30 capsule 0  . hydrALAZINE (APRESOLINE) 50 MG tablet TAKE 1 TABLET BY MOUTH THREE TIMES A DAY (Patient taking differently: Take 50 mg by mouth 3 (three) times daily. ) 30 tablet 0  . insulin aspart (NOVOLOG FLEXPEN) 100 UNIT/ML FlexPen Inject 10 Units into the skin 3 (three) times daily with meals. 15 mL 4  . LANTUS SOLOSTAR 100 UNIT/ML Solostar Pen INJECT 50 UNITS SUBCUTANEOUSLY EVERY DAY (Patient taking differently: Inject 20 Units into the skin daily. ) 15 mL 3  . MAGNESIUM PO Take 1 tablet by mouth daily.    . Oxycodone HCl 10 MG TABS Take 10 mg by mouth 3 (three) times daily as needed (pain).     . Blood Glucose Monitoring Suppl (ACCU-CHEK AVIVA PLUS) w/Device KIT CHECK BLOOD SUGARS THREE TIMES A DAY.  [id number]:250.03 1 kit 0  . calcium citrate-vitamin D 500-400 MG-UNIT chewable tablet Chew 1 tablet by mouth 2 (two) times daily. (Patient not taking: Reported on 12/28/2019) 180 tablet 1  . glucose blood (ACCU-CHEK AVIVA PLUS) test strip USE TO CHECK BLOOD SUGAR BEFORE MEALS AND AT BEDTIME dx code E10.65 125 each 6  . Insulin Pen Needle (TRUEPLUS PEN NEEDLES) 32G X 4 MM MISC Use to inject insulin as directed. 100 each 11    Home: Home Living Family/patient expects to be discharged  to:: Private residence Living Arrangements: Children, Non-relatives/Friends Available Help at Discharge: Friend(s), Family, Available PRN/intermittently Type of Home: House Home Access: Ramped entrance Home Layout: One level Bathroom Shower/Tub: Walk-in shower Home Equipment: Environmental consultant - 2 wheels, Bedside commode, Toilet riser Additional Comments: plans to stay with friends with ramp until moves to another home.  Functional History: Prior Function Level of Independence: Independent with assistive device(s) Comments: Using knee walker Functional Status:  Mobility: Bed Mobility Overal bed mobility: Needs Assistance Bed Mobility: Rolling, Sidelying to Sit Rolling: Min assist Supine to sit: Mod assist Sit to supine: Total assist, +2 for physical assistance, +2 for safety/equipment General bed mobility comments: RN in to  assist with pericare and rolling prior to mobilizing due to pt with diarrhea upon arrival (improved today per RN), assist mostly due to pain in L LE, pt attempting to assist as able Transfers Overall transfer level: Needs assistance Equipment used: Rolling walker (2 wheeled) Transfer via Lift Equipment: Stedy Transfers: Sit to/from Stand, [employer] Transfers Sit to Stand: +2 physical assistance, From elevated surface, Mod assist Stand pivot transfers: Mod assist, +2 physical assistance  Lateral/Scoot Transfers: +2 physical assistance, +2 safety/equipment, Max assist General transfer comment: assist to rise and steady, pt able to stand for approx one minute, cues for using UEs more for support due to pain, pt encouraged to transfer to recliner, cues for pivoting right foot Ambulation/Gait General Gait Details: NT    ADL: ADL Overall ADL's : Needs assistance/impaired Eating/Feeding: Independent, Sitting Grooming: Set up, Sitting Upper Body Bathing: Set up, Sitting Lower Body Bathing: Maximal assistance, Sitting/lateral leans Upper Body Dressing : Set up,  Sitting Lower Body Dressing: Maximal assistance, Sitting/lateral leans, Sit to/from stand Lower Body Dressing Details (indicate cue type and reason): d/t pain in L LE with mobility Toilet Transfer: BSC, Stand-pivot, Total assistance, +2 for physical assistance, +2 for safety/equipment Toilet Transfer Details (indicate cue type and reason): Total assist to transfer to BSc with use of [name]. +3 assistance for safety to manage patient, equipment, and patient's LLE due to pain. Toileting- Clothing Manipulation and Hygiene: Total assistance, Sitting/lateral lean Toileting - Clothing Manipulation Details (indicate cue type and reason): Total assist for toileting on BSC. Functional mobility during ADLs: Moderate assistance, +2 for safety/equipment, +2 for physical assistance General ADL Comments: patient requiring increased assistance with self care due to pain, decreased activity tolerance, balance, safety awareness  Cognition: Cognition Overall Cognitive Status: Within Functional Limits for tasks assessed Orientation Level: Oriented X4 Cognition Arousal/Alertness: Awake/alert Behavior During Therapy: WFL for tasks assessed/performed, Flat affect Overall Cognitive Status: Within Functional Limits for tasks assessed  Blood pressure (!) 149/57, pulse 66, temperature 98 F (36.7 C), temperature source Oral, resp. rate 18, height _0  (1.778 m), weight 88.6 kg, SpO2 96 %. Physical Exam  General: Alert and oriented x 3, No apparent distress HEENT: Head is normocephalic, atraumatic, PERRLA, EOMI, sclera anicteric, oral mucosa pink and moist, dentition intact, ext ear canals clear,  Neck: Supple without JVD or lymphadenopathy Heart: Reg rate and rhythm. No murmurs rubs or gallops Chest: CTA bilaterally without wheezes, rales, or rhonchi; no distress Abdomen: Soft, non-tender, non-distended, bowel sounds positive. Extremities: No clubbing, cyanosis, or edema. Pulses are 2+ Skin: Clean and  intact without signs of breakdown Neuro: Pt is cognitively appropriate with normal insight, memory, and awareness. Cranial nerves 2-12 are intact. Sensory exam is normal. Reflexes are 2+ in all 4's. Fine motor coordination is intact. No tremors. Motor function is grossly 5/5 with exception of LLE.  Musculoskeletal: Left lower extremity not tested given fracture. Well healed left foot Chopart amputation Psych: Pt's affect is appropriate. Pt is cooperative   Results for orders placed or performed during the hospital encounter of 12/28/19 (from the past 24 hour(s))  Glucose, capillary     Status: Abnormal   Collection Time: 01/01/20 11:14 AM  Result Value Ref Range   Glucose-Capillary 177 (H) 70 - 99 mg/dL  Glucose, capillary     Status: Abnormal   Collection Time: 01/01/20  4:22 PM  Result Value Ref Range   Glucose-Capillary 218 (H) 70 - 99 mg/dL  Glucose, capillary     Status: Abnormal  Collection Time: 01/01/20  8:06 PM  Result Value Ref Range   Glucose-Capillary 248 (H) 70 - 99 mg/dL  Glucose, capillary     Status: Abnormal   Collection Time: 01/02/20  7:15 AM  Result Value Ref Range   Glucose-Capillary 163 (H) 70 - 99 mg/dL  Basic metabolic panel     Status: Abnormal   Collection Time: 01/02/20  8:44 AM  Result Value Ref Range   Sodium 131 (L) 135 - 145 mmol/L   Potassium 4.8 3.5 - 5.1 mmol/L   Chloride 105 98 - 111 mmol/L   CO2 15 (L) 22 - 32 mmol/L   Glucose, Bld 171 (H) 70 - 99 mg/dL   BUN 87 (H) 6 - 20 mg/dL   Creatinine, Ser 6.17 (H) 0.44 - 1.00 mg/dL   Calcium 7.9 (L) 8.9 - 10.3 mg/dL   GFR, Estimated 8 (L) >60 mL/min   Anion gap 11 5 - 15  CBC     Status: Abnormal   Collection Time: 01/02/20  8:44 AM  Result Value Ref Range   WBC 17.6 (H) 4.0 - 10.5 K/uL   RBC 2.72 (L) 3.87 - 5.11 MIL/uL   Hemoglobin 7.5 (L) 12.0 - 15.0 g/dL   HCT 24.3 (L) 36 - 46 %   MCV 89.3 80.0 - 100.0 fL   MCH 27.6 26.0 - 34.0 pg   MCHC 30.9 30.0 - 36.0 g/dL   RDW 15.2 11.5 - 15.5 %    Platelets 324 150 - 400 K/uL   nRBC 0.0 0.0 - 0.2 %   No results found.   Assessment/Plan: Diagnosis: s/p left femur fracture intramedullary fixation 10/28.  1. Does the need for close, 24 hr/day medical supervision in concert with the patient's rehab needs make it unreasonable for this patient to be served in a less intensive setting? Yes 2. Co-Morbidities requiring supervision/potential complications: DM, HTN, CKD 5, status post left foot Chopart amputation 2014, prior CVA, diarrhea, post-op pain, insomnia 3. Due to bladder management, bowel management, safety, skin/wound care, disease management, medication administration, pain management and patient education, does the patient require 24 hr/day rehab nursing? Yes 4. Does the patient require coordinated care of a physician, rehab nurse, therapy disciplines of PT, OT to address physical and functional deficits in the context of the above medical diagnosis(es)? Yes Addressing deficits in the following areas: balance, endurance, locomotion, strength, transferring, bowel/bladder control, bathing, dressing, feeding, grooming, toileting and psychosocial support 5. Can the patient actively participate in an intensive therapy program of at least 3 hrs of therapy per day at least 5 days per week? Yes 6. The potential for patient to make measurable gains while on inpatient rehab is excellent 7. Anticipated functional outcomes upon discharge from inpatient rehab are modified independent  with PT, modified independent with OT, independent with SLP. 8. Estimated rehab length of stay to reach the above functional goals is: 12-16 days 9. Anticipated discharge destination: Home 10. Overall Rehab/Functional Prognosis: excellent  RECOMMENDATIONS: This patient's condition is appropriate for continued rehabilitative care in the following setting: CIR Patient has agreed to participate in recommended program. Potentially Note that insurance prior authorization  may be required for reimbursement for recommended care.  Comment: Patient would benefit from [place] 30 minutes prior to therapy sessions to maximize her participation with therapy.   Thank you for this consult. Admission coordinator to follow.    Leeroy Cha, MD

## 2020-01-02 NOTE — Progress Notes (Signed)
Inpatient Rehabilitation Admissions Coordinator  I spoke with patient by phone to discuss goals and expectations of a possible Cir admit. She is in agreement to admit if approved by her insurance. I will begin insurance approval with [employer] and follow up with team once I hear their decision.  Danne Baxter, RN, MSN Rehab Admissions Coordinator [phone] 01/02/2020 12:49 PM

## 2020-01-02 NOTE — Anesthesia Postprocedure Evaluation (Signed)
Anesthesia Post Note  Patient: [name]  Procedure(s) Performed: INTRAMEDULLARY (IM) NAIL INTERTROCHANTRIC (Left Hip)     Patient location during evaluation: PACU Anesthesia Type: General Level of consciousness: sedated Pain management: pain level controlled Vital Signs Assessment: post-procedure vital signs reviewed and stable Respiratory status: spontaneous breathing and respiratory function stable Cardiovascular status: stable Postop Assessment: no apparent nausea or vomiting Anesthetic complications: no   No complications documented.  Last Vitals:  Vitals:   01/01/20 2011 01/02/20 0537  BP: (!) 144/65 (!) 149/57  Pulse: 68 66  Resp: 19 18  Temp: 36.7 C 36.7 C  SpO2: 99% 96%    Last Pain:  Vitals:   01/02/20 0537  TempSrc: Oral  PainSc:                  [name]

## 2020-01-02 NOTE — TOC Progression Note (Signed)
Transition of Care [facility]) - Progression Note    Patient Details  Name: DONNAMAE MUILENBURG MRN: [id number] Date of Birth: 1965/11/07  Transition [facility]) CM/SW Contact  Shade Flood, LCSW Phone Number: 01/02/2020, 10:35 AM  Clinical Narrative:     TOC following. Spoke with pt this AM about dc planning. Pt had informed CIR staff over the weekend that she wasn't sure she wanted to go to CIR. Discussed pt's reservations with her and pt expresses that she is concerned about the length of time she will need to stay there. Updated pt about this LCSW concern about being able to find a [facility] agency that will accept her insurance at dc. Encouraged pt to strongly consider CIR option for a quicker recovery. Pt stated she would think about it.  Updated Barbara at [facility] who has also spoken with pt this AM. TOC will follow and assist as needed.  Expected Discharge Plan: IP Rehab Facility Barriers to Discharge: Continued Medical Work up  Expected Discharge Plan and Services Expected Discharge Plan: [place]   Discharge Planning Services: CM Consult Post Acute Care Choice: IP Rehab Living arrangements for the past 2 months: Single Family Home                                       Social Determinants of Health (SDOH) Interventions    Readmission Risk Interventions No flowsheet data found.

## 2020-01-02 NOTE — Progress Notes (Signed)
Physical Therapy Treatment Patient Details Name: [name] MRN: [id number] DOB: November 10, 1965 Today's Date: 01/02/2020    History of Present Illness 54 year old female with DM, HTN, CKD 5, status post left foot Chopart amputation 2014 managed by podiatry, left SFA vascularization in 2020, prior CVA who came into the hospital after a ground-level fall resulting in left hip fracture,   S/P Intramedullary fixation, Left femur on 10/28.    PT Comments    Pt found in bed with Pure Wick suction off; pt and bed soaked in urine. Her resting HR before therapy is recorded at 66 bpm. She requires Mod A for VC's to use bed handrails and to bring legs around; requires max A with maneuver of L LE during sitting pivot but is mod A with maneuvering everything else. Once sitting up, her HR is 67 bpm. Pt performs standing pivot transfer without AD mod A +2 for physical assistance to stand and +2 for safety once standing; main therapist assist was to help in boosting up from seated position. Pt c/o pain throughout session. Claims to have elevated pain of 10/10 once seated on hard toilet seat. Asks for assist in extended L knee to decrease discomfort. Unable to complete any gait activities due to pain. Pt performs standing pivot transfer to chair mod A +2. Requests ice for L hip and is positioned for comfort.   Follow Up Recommendations  CIR;Home health PT;Supervision/Assistance - 24 hour     Equipment Recommendations  Wheelchair (measurements PT);Wheelchair cushion (measurements PT)    Recommendations for Other Services Rehab consult     Precautions / Restrictions Precautions Precautions: Fall Precaution Comments: has been limited WB on L foot plantar due to wound, using Knee walker PTA. Restrictions Other Position/Activity Restrictions: WBAT on Left hip but has been essentially NWB on L plantar PTA    Mobility  Bed Mobility Overal bed mobility: Needs Assistance Bed Mobility: Supine to Sit      Supine to sit: Mod assist        Transfers Overall transfer level: Needs assistance Equipment used: Rolling walker (2 wheeled);None Transfers: Sit to/from [facility] Sit to Stand: +2 physical assistance;Mod assist Stand pivot transfers: Mod assist;+2 safety/equipment          Ambulation/Gait                 Stairs             Wheelchair Mobility    Modified Rankin (Stroke Patients Only)       Balance                                            Cognition Arousal/Alertness: Awake/alert Behavior During Therapy: WFL for tasks assessed/performed;Flat affect Overall Cognitive Status: Within Functional Limits for tasks assessed                                        Exercises      General Comments        Pertinent Vitals/Pain Pain Assessment: 0-10 Pain Score: 10-Worst pain ever Pain Location: LLE Pain Descriptors / Indicators: Guarding;Grimacing;Sore Pain Intervention(s): Monitored during session    Home Living     Available Help at Discharge:  (daughter works day shift)   Home Access: Stairs to enter Entrance  Stairs-Rails: None          Prior Function            PT Goals (current goals can now be found in the care plan section) Acute Rehab PT Goals Patient Stated Goal: to go home PT Goal Formulation: With patient Time For Goal Achievement: 01/13/20 Potential to Achieve Goals: Good Additional Goals Additional Goal #1: set up and propel WC with min assist x 50'    Frequency    Min 6X/week      PT Plan Current plan remains appropriate    Co-evaluation              AM-PAC PT "6 Clicks" Mobility   Outcome Measure  Help needed turning from your back to your side while in a flat bed without using bedrails?: A Lot Help needed moving from lying on your back to sitting on the side of a flat bed without using bedrails?: A Lot   Help needed standing up from a chair using  your arms (e.g., wheelchair or bedside chair)?: A Lot Help needed to walk in hospital room?: A Lot Help needed climbing 3-5 steps with a railing? : Total 6 Click Score: 9    End of Session Equipment Utilized During Treatment: Gait belt Activity Tolerance: Patient limited by pain Patient left: in chair;with call bell/phone within reach;with chair alarm set;with nursing/sitter in room Nurse Communication: Mobility status PT Visit Diagnosis: Difficulty in walking, not elsewhere classified (R26.2);Pain Pain - Right/Left: Left Pain - part of body: Hip     Time: 8119-1478 PT Time Calculation (min) (ACUTE ONLY): 28 min  Charges:  $Therapeutic Activity: 23-37 mins                     C. Parks [place], [place] [phone]

## 2020-01-02 NOTE — Progress Notes (Signed)
PROGRESS NOTE  [name] IRW:[id number] DOB: 1965/09/16 DOA: 12/28/2019 PCP: Julian Hy, PA-C   LOS: 5 days   Brief Narrative / Interim history: 54 year old female with DM, HTN, CKD 5, status post left foot Chopart amputation 2014 managed by podiatry, left SFA vascularization in 2020, prior CVA who came into the hospital after a ground-level fall resulting in left hip fracture.  Orthopedic surgery has consulted and plans to operate 10/28.  Nephrology also consulted on admission due to chronic kidney disease.  Subjective / 24h Interval events: Feels well, no significant complaints  Assessment & Plan: Principal Problem Intertrochanteric fracture of the proximal left femur with avulsion of the lesser trochanter status post mechanical fall-orthopedic surgery consulted, she is status post intramedullary fixation of the left femur by Dr. Lyla Glassing on 10/28 -PT recommended CIR, patient agreeable, to start insurance authorization  Active Problems Acute on chronic anemia of chronic kidney disease-hemoglobin dropped to 6.3, status post 2 units of packed red blood cells.  She was given Lasix x1.  -Hemoglobin 7.5, continue to monitor, if it reaches 7 or less will transfuse.  Recheck in the morning.  Leukocytosis -Likely reactive, improving and stable  Acute kidney injury on chronic kidney disease stage V -baseline creatinine around 4, her creatinine was in the six range on admission.  Nephrology evaluated patient.  Due to lower extremity swelling she was diuresed with Lasix x2, 10/29-30.  Creatinine around 6 which may be her new baseline  Volume overload with bilateral lower extremity edema-improving with 2 doses of Lasix.  Swelling improved, hold further diuresis  Type 2 diabetes mellitus -Continue Lantus, sliding scale, CBGs stable today  CBG (last 3)  Recent Labs    01/01/20 2006 01/02/20 0715 01/02/20 1155  GLUCAP 248* 163* 176*    Essential hypertension-required up  titration of her antihypertensive regimen for couple days neuro, finally blood pressure better  Status post Chopart amputation -see pictures as below, wound healing well  Scheduled Meds: . (feeding supplement) PROSource Plus  30 mL Oral TID BM  . alosetron  1 mg Oral BID  . amLODipine  10 mg Oral Daily  . atorvastatin  40 mg Oral q1800  . carvedilol  25 mg Oral BID WC  . Chlorhexidine Gluconate Cloth  6 each Topical Daily  . docusate sodium  100 mg Oral BID  . DULoxetine  60 mg Oral Daily  . enoxaparin (LOVENOX) injection  30 mg Subcutaneous Q24H  . hydrALAZINE  100 mg Oral TID  . insulin aspart  0-9 Units Subcutaneous TID WC  . insulin aspart  3 Units Subcutaneous TID WC  . insulin glargine  20 Units Subcutaneous QHS  . multivitamin  1 tablet Oral QHS  . pantoprazole  40 mg Oral Daily   Continuous Infusions: . sodium chloride 10 mL/hr at 12/29/19 2300   PRN Meds:.sodium chloride, calcium carbonate, cyclobenzaprine, hydrALAZINE, HYDROcodone-acetaminophen, menthol-cetylpyridinium **OR** phenol, metoCLOPramide **OR** metoCLOPramide (REGLAN) injection, morphine injection, ondansetron **OR** ondansetron (ZOFRAN) IV, senna-docusate  Diet Orders (From admission, onward)    Start     Ordered   12/29/19 1621  Diet Carb Modified Fluid consistency: Thin; Room service appropriate? Yes  Diet effective now       Question Answer Comment  Calorie Level Medium 1600-2000   Fluid consistency: Thin   Room service appropriate? Yes      12/29/19 1620          DVT prophylaxis: enoxaparin (LOVENOX) injection 30 mg Start: 12/31/19 1000 SCDs Start: 12/29/19 1621  Code Status: Full Code  Family Communication: no family at bedside   Status is: Inpatient  Remains inpatient appropriate because:Inpatient level of care appropriate due to severity of illness   Dispo: The patient is from: Home              Anticipated d/c is to: SNF              Anticipated d/c date is: 3 days               Patient currently is not medically stable to d/c.  Consultants:  Orthopedic surgery Nephrology   Procedures:  None   Microbiology  None   Antimicrobials: None     Objective: Vitals:   01/01/20 0532 01/01/20 1255 01/01/20 2011 01/02/20 0537  BP: (!) 140/57 (!) 153/57 (!) 144/65 (!) 149/57  Pulse: 74 71 68 66  Resp: 17 18 19 18   Temp:  98.9 F (37.2 C) 98.1 F (36.7 C) 98 F (36.7 C)  TempSrc:  Oral Oral Oral  SpO2: 96% 96% 99% 96%  Weight:      Height:        Intake/Output Summary (Last 24 hours) at 01/02/2020 1259 Last data filed at 01/02/2020 0500 Gross per 24 hour  Intake --  Output 725 ml  Net -725 ml   Filed Weights   12/28/19 1047 12/28/19 1526 12/29/19 1147  Weight: 89.8 kg 88.6 kg 88.6 kg    Examination:  Constitutional: No distress, in bed Eyes: No icterus ENMT: Moist mucous membranes Neck: normal, supple Respiratory: Clear bilaterally, no wheezing, no crackles Cardiovascular: Regular rate and rhythm, no murmurs, trace edema  Abdomen: Nondistended, bowel sounds positive Musculoskeletal: no clubbing / cyanosis.  Skin: No rashes seen Neurologic: Nonfocal       Data Reviewed: I have independently reviewed following labs and imaging studies   CBC: Recent Labs  Lab 12/28/19 1200 12/29/19 0402 12/29/19 1653 12/30/19 0415 12/31/19 0518 01/01/20 0612 01/02/20 0844  WBC 17.6*  --  26.1* 21.4* 23.6* 16.0* 17.6*  NEUTROABS 15.3*  --   --   --   --   --   --   HGB 6.3*   < > 8.6* 8.3* 9.0* 8.1* 7.5*  HCT 20.5*   < > 26.9* 26.2* 28.7* 25.7* 24.3*  MCV 87.6  --  87.9 87.6 87.5 88.6 89.3  PLT 403*  --  399 415* 477* 376 324   < > = values in this interval not displayed.   Basic Metabolic Panel: Recent Labs  Lab 12/28/19 1200 12/28/19 1200 12/29/19 1653 12/30/19 0415 12/31/19 0518 01/01/20 0612 01/02/20 0844  NA 134*  --   --  132* 134* 134* 131*  K 4.7  --   --  5.0 4.5 4.4 4.8  CL 103  --   --  103 104 106 105  CO2 16*  --   --   16* 18* 16* 15*  GLUCOSE 297*  --   --  336* 222* 139* 171*  BUN 67*  --   --  74* 76* 89* 87*  CREATININE 6.64*   < > 6.19* 6.24* 5.68* 5.98* 6.17*  CALCIUM 7.8*  --   --  7.8* 8.3* 8.1* 7.9*   < > = values in this interval not displayed.   Liver Function Tests: Recent Labs  Lab 12/30/19 0415  AST 16  ALT 15  ALKPHOS 168*  BILITOT 0.8  PROT 7.1  ALBUMIN 2.4*   Coagulation Profile: Recent Labs  Lab 12/28/19 1200  INR 1.1   HbA1C: No results for input(s): HGBA1C in the last 72 hours. CBG: Recent Labs  Lab 01/01/20 1114 01/01/20 1622 01/01/20 2006 01/02/20 0715 01/02/20 1155  GLUCAP 177* 218* 248* 163* 176*    Recent Results (from the past 240 hour(s))  Respiratory Panel by RT PCR (Flu A&B, Covid) - Nasopharyngeal Swab     Status: None   Collection Time: 12/28/19 12:00 PM   Specimen: Nasopharyngeal Swab  Result Value Ref Range Status   SARS Coronavirus 2 by RT PCR NEGATIVE NEGATIVE Final    Comment: (NOTE) SARS-CoV-2 target nucleic acids are NOT DETECTED.  The SARS-CoV-2 RNA is generally detectable in upper respiratoy specimens during the acute phase of infection. The lowest concentration of SARS-CoV-2 viral copies this assay can detect is 131 copies/mL. A negative result does not preclude SARS-Cov-2 infection and should not be used as the sole basis for treatment or other patient management decisions. A negative result may occur with  improper specimen collection/handling, submission of specimen other than nasopharyngeal swab, presence of viral mutation(s) within the areas targeted by this assay, and inadequate number of viral copies (<131 copies/mL). A negative result must be combined with clinical observations, patient history, and epidemiological information. The expected result is Negative.  Fact Sheet for Patients:  PinkCheek.be  Fact Sheet for Healthcare Providers:  GravelBags.it  This test  is no t yet approved or cleared by the [place] FDA and  has been authorized for detection and/or diagnosis of SARS-CoV-2 by FDA under an Emergency Use Authorization (EUA). This EUA will remain  in effect (meaning this test can be used) for the duration of the COVID-19 declaration under Section 564(b)(1) of the Act, 21 U.S.C. section 360bbb-3(b)(1), unless the authorization is terminated or revoked sooner.     Influenza A by PCR NEGATIVE NEGATIVE Final   Influenza B by PCR NEGATIVE NEGATIVE Final    Comment: (NOTE) The Xpert Xpress SARS-CoV-2/FLU/RSV assay is intended as an aid in  the diagnosis of influenza from Nasopharyngeal swab specimens and  should not be used as a sole basis for treatment. Nasal washings and  aspirates are unacceptable for Xpert Xpress SARS-CoV-2/FLU/RSV  testing.  Fact Sheet for Patients: PinkCheek.be  Fact Sheet for Healthcare Providers: GravelBags.it  This test is not yet approved or cleared by the [place] FDA and  has been authorized for detection and/or diagnosis of SARS-CoV-2 by  FDA under an Emergency Use Authorization (EUA). This EUA will remain  in effect (meaning this test can be used) for the duration of the  Covid-19 declaration under Section 564(b)(1) of the Act, 21  U.S.C. section 360bbb-3(b)(1), unless the authorization is  terminated or revoked. Performed at [facility], [place] [address] [place], [place] [address]   Surgical pcr screen     Status: Abnormal   Collection Time: 12/28/19  4:50 PM   Specimen: Nasal Mucosa; Nasal Swab  Result Value Ref Range Status   MRSA, PCR NEGATIVE NEGATIVE Final   Staphylococcus aureus POSITIVE (A) NEGATIVE Final    Comment: (NOTE) The Xpert SA Assay (FDA approved for NASAL specimens in patients 27 years of age and older), is one component of a comprehensive surveillance program. It is not intended to diagnose  infection nor to guide or monitor treatment. Performed at [facility], [place] [address] [place], [place] [address]      Radiology Studies: No results found.  Marzetta Board, MD, PhD [facility]  Between 7 am - 7 pm I am available, please contact me via Amion or Securechat  Between 7 pm - 7 am I am not available, please contact night coverage MD/APP via Amion

## 2020-01-02 NOTE — Plan of Care (Signed)
  Problem: Clinical Measurements: Goal: Ability to maintain clinical measurements within normal limits will improve Outcome: Progressing Goal: Will remain free from infection Outcome: Progressing   Problem: Activity: Goal: Risk for activity intolerance will decrease Outcome: Progressing   Problem: Nutrition: Goal: Adequate nutrition will be maintained Outcome: Progressing   Problem: Elimination: Goal: Will not experience complications related to bowel motility Outcome: Progressing Goal: Will not experience complications related to urinary retention Outcome: Progressing   Problem: Pain Managment: Goal: General experience of comfort will improve Outcome: Progressing   Problem: Safety: Goal: Ability to remain free from injury will improve Outcome: Progressing   Problem: Education: Goal: Verbalization of understanding the information provided (i.e., activity precautions, restrictions, etc) will improve Outcome: Progressing   Problem: Activity: Goal: Ability to ambulate and perform ADLs will improve Outcome: Progressing   Problem: Clinical Measurements: Goal: Postoperative complications will be avoided or minimized Outcome: Progressing   Problem: Self-Concept: Goal: Ability to maintain and perform role responsibilities to the fullest extent possible will improve Outcome: Progressing   Problem: Pain Management: Goal: Pain level will decrease Outcome: Progressing

## 2020-01-02 NOTE — Progress Notes (Signed)
Pt a/ox4, aware that she had incontinence if urine but refused to be turned and changed, states that she's comfortable and she doesn't want to move, offered her that we will help her turn and explained the consequences of sitting in that urine all night  but pt still insist not be changed.

## 2020-01-02 NOTE — Progress Notes (Signed)
    Subjective:  Patient reports pain as mild to moderate.  Denies N/V/CP/SOB.   Objective:   VITALS:   Vitals:   01/01/20 0532 01/01/20 1255 01/01/20 2011 01/02/20 0537  BP: (!) 140/57 (!) 153/57 (!) 144/65 (!) 149/57  Pulse: 74 71 68 66  Resp: 17 18 19 18   Temp:  98.9 F (37.2 C) 98.1 F (36.7 C) 98 F (36.7 C)  TempSrc:  Oral Oral Oral  SpO2: 96% 96% 99% 96%  Weight:      Height:        NAD ABD soft Neurovascular intact Incision: dressing C/D/I   Lab Results  Component Value Date   WBC 16.0 (H) 01/01/2020   HGB 8.1 (L) 01/01/2020   HCT 25.7 (L) 01/01/2020   MCV 88.6 01/01/2020   PLT 376 01/01/2020   BMET    Component Value Date/Time   NA 134 (L) 01/01/2020 0612   NA 140 10/29/2018 1439   K 4.4 01/01/2020 0612   CL 106 01/01/2020 0612   CO2 16 (L) 01/01/2020 0612   GLUCOSE 139 (H) 01/01/2020 0612   BUN 89 (H) 01/01/2020 0612   BUN 43 (H) 10/29/2018 1439   CREATININE 5.98 (H) 01/01/2020 0612   CREATININE 1.92 (H) 01/10/2016 1528   CALCIUM 8.1 (L) 01/01/2020 0612   GFRNONAA 8 (L) 01/01/2020 0612   GFRNONAA 30 (L) 01/10/2016 1528   GFRAA 13 (L) 12/17/2018 0907   GFRAA 35 (L) 01/10/2016 1528     Assessment/Plan: 4 Days Post-Op   Principal Problem:   Fracture Active Problems:   Diabetes mellitus type 2, uncontrolled, with complications (HCC)   CKD (chronic kidney disease) stage 5, GFR less than 15 ml/min (HCC)   AKI (acute kidney injury) (HCC)   Volume overload   Acute on chronic anemia   WBAT with walker DVT ppx: Lovenox, SCDs, TEDS PO pain control PT/OT Dispo: D/C planning, considering CIR     Dorothyann Peng 01/02/2020, 8:41 AM   [facility] is now [address], [place], [place] [address] Phone: [phone] [email or web address] Facebook  [employer]

## 2020-01-03 ENCOUNTER — Inpatient Hospital Stay (HOSPITAL_COMMUNITY): Payer: Medicaid Other

## 2020-01-03 DIAGNOSIS — E118 Type 2 diabetes mellitus with unspecified complications: Secondary | ICD-10-CM | POA: Diagnosis not present

## 2020-01-03 DIAGNOSIS — N185 Chronic kidney disease, stage 5: Secondary | ICD-10-CM | POA: Diagnosis not present

## 2020-01-03 DIAGNOSIS — N179 Acute kidney failure, unspecified: Secondary | ICD-10-CM | POA: Diagnosis not present

## 2020-01-03 DIAGNOSIS — L03116 Cellulitis of left lower limb: Secondary | ICD-10-CM | POA: Diagnosis not present

## 2020-01-03 DIAGNOSIS — M85872 Other specified disorders of bone density and structure, left ankle and foot: Secondary | ICD-10-CM | POA: Diagnosis not present

## 2020-01-03 DIAGNOSIS — E1165 Type 2 diabetes mellitus with hyperglycemia: Secondary | ICD-10-CM | POA: Diagnosis not present

## 2020-01-03 DIAGNOSIS — T148XXA Other injury of unspecified body region, initial encounter: Secondary | ICD-10-CM | POA: Diagnosis not present

## 2020-01-03 DIAGNOSIS — D649 Anemia, unspecified: Secondary | ICD-10-CM | POA: Diagnosis not present

## 2020-01-03 LAB — CBC
HCT: 23.4 % — ABNORMAL LOW (ref 36.0–46.0)
Hemoglobin: 7.2 g/dL — ABNORMAL LOW (ref 12.0–15.0)
MCH: 27.8 pg (ref 26.0–34.0)
MCHC: 30.8 g/dL (ref 30.0–36.0)
MCV: 90.3 fL (ref 80.0–100.0)
Platelets: 294 10*3/uL (ref 150–400)
RBC: 2.59 MIL/uL — ABNORMAL LOW (ref 3.87–5.11)
RDW: 15.3 % (ref 11.5–15.5)
WBC: 28.3 10*3/uL — ABNORMAL HIGH (ref 4.0–10.5)
nRBC: 0 % (ref 0.0–0.2)

## 2020-01-03 LAB — BASIC METABOLIC PANEL
Anion gap: 10 (ref 5–15)
BUN: 103 mg/dL — ABNORMAL HIGH (ref 6–20)
CO2: 14 mmol/L — ABNORMAL LOW (ref 22–32)
Calcium: 8 mg/dL — ABNORMAL LOW (ref 8.9–10.3)
Chloride: 104 mmol/L (ref 98–111)
Creatinine, Ser: 6.22 mg/dL — ABNORMAL HIGH (ref 0.44–1.00)
GFR, Estimated: 8 mL/min — ABNORMAL LOW (ref >60)
Glucose, Bld: 158 mg/dL — ABNORMAL HIGH (ref 70–99)
Potassium: 4.9 mmol/L (ref 3.5–5.1)
Sodium: 128 mmol/L — ABNORMAL LOW (ref 135–145)

## 2020-01-03 LAB — GLUCOSE, CAPILLARY
Glucose-Capillary: 142 mg/dL — ABNORMAL HIGH (ref 70–99)
Glucose-Capillary: 163 mg/dL — ABNORMAL HIGH (ref 70–99)
Glucose-Capillary: 258 mg/dL — ABNORMAL HIGH (ref 70–99)
Glucose-Capillary: 208 mg/dL — ABNORMAL HIGH (ref 70–99)

## 2020-01-03 LAB — PREPARE RBC (CROSSMATCH)

## 2020-01-03 MED ORDER — SODIUM CHLORIDE 0.9% IV SOLUTION: Freq: Once | INTRAVENOUS | Status: AC

## 2020-01-03 NOTE — Progress Notes (Signed)
Inpatient Rehabilitation Admissions Coordinator  I have received a denial for CIR admit from pt's [place] medicaid [employer] [place]. I spoke with patient by phone and she is aware. She would like to discuss with her daughter before pursuing SNF vs Home with [facility]. I have alerted acute team and TOC. We will sign off at this time.  Danne Baxter, RN, MSN Rehab Admissions Coordinator [phone] 01/03/2020 5:36 PM

## 2020-01-03 NOTE — Progress Notes (Signed)
OT Cancellation Note  Patient Details Name: MIKYA DON MRN: [id number] DOB: 03-18-1965   Cancelled Treatment:    Reason Eval/Treat Not Completed: Other (comment) Upon arrival patient just eating lunch, reports to receive blood transfusion today. Will check back 11/3 as schedule permits.  Delbert Phenix OT OT pager: [phone]   Rosemary Holms 01/03/2020, 2:15 PM

## 2020-01-03 NOTE — Progress Notes (Addendum)
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  Orthopedic surgery has consulted and she is status post operative repair 10/28.  Nephrology also consulted on admission due to chronic kidney disease and signed off since.  She is awaiting insurance authorization for CIR, ongoing problems are leukocytosis, renal failure, anemia  Subjective / 24h Interval events: No chest pain, no shortness of breath.  No cough or chest congestion.  No dysuria.  No abdominal pain, no nausea or vomiting.  Assessment & Plan: Principal Problem Intertrochanteric fracture of the proximal left femur with avulsion of the lesser trochanter status post mechanical fall-orthopedic surgery consulted, she is status post intramedullary fixation of the left femur by Dr. Lyla Glassing on 10/28 -PT recommended CIR, patient agreeable, insurance authorization is pending  Active Problems Acute on chronic anemia of chronic kidney disease, postoperative acute blood loss anemia-hemoglobin dropped to 6.3, status post 2 units of packed red blood cells.  She was given Lasix x1.  -Hemoglobin dipping down over the last couple of days, 7.5 yesterday and even lower today.  We will transfuse a unit of packed red blood cells and repeat CBC tomorrow morning  Leukocytosis -Likely reactive in the setting of hip fracture, however was improving and then her WBC is back up today.  She has no infectious symptoms currently.  She did have diarrhea a few days ago.  Her foot does not look particularly infected, obtain an x-ray which showed subtle erosive changes on the calcaneal stump and osteomyelitis cannot be  excluded.  I ordered an MRI however patient refuses stating "I do not think I need this test".  I noted that an MRI has been ordered as an outpatient also about 2 weeks ago  Acute kidney injury on chronic kidney disease stage V -baseline creatinine around 4, her creatinine was in the six range on admission.  Nephrology evaluated patient.  Due to lower extremity swelling she was diuresed with Lasix x2, 10/29-30.  Creatinine has remained around 6 which may be her new baseline.  She has no uremic symptoms.  BUN is climbing some today.  Repeat renal function in the morning, if it continues to go up may need to touch base with nephrology again  Volume overload with bilateral lower extremity edema-improving with 2 doses of Lasix.  Swelling improved, hold further diuresis  Type 2 diabetes mellitus -Continue Lantus, sliding scale, CBGs are stable  CBG (last 3)  Recent Labs    01/02/20 1634 01/02/20 2232 01/03/20 0729  GLUCAP 156* 158* 142*    Essential hypertension-required up titration of her antihypertensive regimen for couple days neuro, finally blood pressure better  Status post Chopart amputation -see pictures as below, wound healing well  Scheduled Meds: . (feeding supplement) PROSource Plus  30 mL Oral TID BM  . sodium chloride   Intravenous Once  . alosetron  1 mg Oral BID  . amLODipine  10 mg Oral Daily  . atorvastatin  40 mg Oral q1800  . carvedilol  25 mg Oral BID WC  . Chlorhexidine Gluconate Cloth  6 each Topical Daily  . docusate sodium  100 mg Oral BID  . DULoxetine  60 mg Oral Daily  .  enoxaparin (LOVENOX) injection  30 mg Subcutaneous Q24H  . hydrALAZINE  100 mg Oral TID  . insulin aspart  0-9 Units Subcutaneous TID WC  . insulin aspart  3 Units Subcutaneous TID WC  . insulin glargine  20 Units Subcutaneous QHS  . multivitamin  1 tablet Oral QHS  . pantoprazole  40 mg Oral Daily   Continuous Infusions: . sodium chloride 10 mL/hr at 12/29/19 2300   PRN Meds:.sodium  chloride, calcium carbonate, cyclobenzaprine, hydrALAZINE, HYDROcodone-acetaminophen, menthol-cetylpyridinium **OR** phenol, metoCLOPramide **OR** metoCLOPramide (REGLAN) injection, morphine injection, ondansetron **OR** ondansetron (ZOFRAN) IV, senna-docusate  Diet Orders (From admission, onward)    Start     Ordered   12/29/19 1621  Diet Carb Modified Fluid consistency: Thin; Room service appropriate? Yes  Diet effective now       Question Answer Comment  Calorie Level Medium 1600-2000   Fluid consistency: Thin   Room service appropriate? Yes      12/29/19 1620          DVT prophylaxis: enoxaparin (LOVENOX) injection 30 mg Start: 12/31/19 1000 SCDs Start: 12/29/19 1621     Code Status: Full Code  Family Communication: Patient's daughter was at bedside  Status is: Inpatient  Remains inpatient appropriate because:Inpatient level of care appropriate due to severity of illness   Dispo: The patient is from: Home              Anticipated d/c is to: CIR              Anticipated d/c date is: 2 days              Patient currently is not medically stable to d/c.  Consultants:  Orthopedic surgery Nephrology   Procedures:  None   Microbiology  None   Antimicrobials: None     Objective: Vitals:   01/02/20 1447 01/02/20 1643 01/02/20 2040 01/03/20 0559  BP: (!) 149/50  (!) 148/49 (!) 146/48  Pulse: 65 66 68 71  Resp: 18  20 14   Temp: 98.1 F (36.7 C)  98 F (36.7 C) 99.1 F (37.3 C)  TempSrc: Oral  Oral Oral  SpO2: 96%  96% 96%  Weight:      Height:        Intake/Output Summary (Last 24 hours) at 01/03/2020 1022 Last data filed at 01/03/2020 0600 Gross per 24 hour  Intake --  Output 100 ml  Net -100 ml   Filed Weights   12/28/19 1047 12/28/19 1526 12/29/19 1147  Weight: 89.8 kg 88.6 kg 88.6 kg    Examination:  Constitutional: NAD, in bed Eyes: No scleral icterus ENMT: Moist mucous membranes Neck: normal, supple Respiratory: Clear to auscultation  without wheezing or crackles Cardiovascular: Regular rate and rhythm, no murmurs, trace edema Abdomen: Nondistended, bowel sounds positive Musculoskeletal: no clubbing / cyanosis.  Skin: No rashes seen Neurologic: No focal deficits       Data Reviewed: I have independently reviewed following labs and imaging studies   CBC: Recent Labs  Lab 12/28/19 1200 12/29/19 0402 12/30/19 0415 12/31/19 0518 01/01/20 0612 01/02/20 0844 01/03/20 0834  WBC 17.6*   < > 21.4* 23.6* 16.0* 17.6* 28.3*  NEUTROABS 15.3*  --   --   --   --   --   --   HGB 6.3*   < > 8.3* 9.0* 8.1* 7.5* 7.2*  HCT 20.5*   < > 26.2* 28.7* 25.7* 24.3* 23.4*  MCV 87.6   < > 87.6 87.5 88.6 89.3  90.3  PLT 403*   < > 415* 477* 376 324 294   < > = values in this interval not displayed.   Basic Metabolic Panel: Recent Labs  Lab 12/30/19 0415 12/31/19 0518 01/01/20 0612 01/02/20 0844 01/03/20 0834  NA 132* 134* 134* 131* 128*  K 5.0 4.5 4.4 4.8 4.9  CL 103 104 106 105 104  CO2 16* 18* 16* 15* 14*  GLUCOSE 336* 222* 139* 171* 158*  BUN 74* 76* 89* 87* 103*  CREATININE 6.24* 5.68* 5.98* 6.17* 6.22*  CALCIUM 7.8* 8.3* 8.1* 7.9* 8.0*   Liver Function Tests: Recent Labs  Lab 12/30/19 0415  AST 16  ALT 15  ALKPHOS 168*  BILITOT 0.8  PROT 7.1  ALBUMIN 2.4*   Coagulation Profile: Recent Labs  Lab 12/28/19 1200  INR 1.1   HbA1C: No results for input(s): HGBA1C in the last 72 hours. CBG: Recent Labs  Lab 01/02/20 0715 01/02/20 1155 01/02/20 1634 01/02/20 2232 01/03/20 0729  GLUCAP 163* 176* 156* 158* 142*    Recent Results (from the past 240 hour(s))  Respiratory Panel by RT PCR (Flu A&B, Covid) - Nasopharyngeal Swab     Status: None   Collection Time: 12/28/19 12:00 PM   Specimen: Nasopharyngeal Swab  Result Value Ref Range Status   SARS Coronavirus 2 by RT PCR NEGATIVE NEGATIVE Final    Comment: (NOTE) SARS-CoV-2 target nucleic acids are NOT DETECTED.  The SARS-CoV-2 RNA is generally  detectable in upper respiratoy specimens during the acute phase of infection. The lowest concentration of SARS-CoV-2 viral copies this assay can detect is 131 copies/mL. A negative result does not preclude SARS-Cov-2 infection and should not be used as the sole basis for treatment or other patient management decisions. A negative result may occur with  improper specimen collection/handling, submission of specimen other than nasopharyngeal swab, presence of viral mutation(s) within the areas targeted by this assay, and inadequate number of viral copies (<131 copies/mL). A negative result must be combined with clinical observations, patient history, and epidemiological information. The expected result is Negative.  Fact Sheet for Patients:  PinkCheek.be  Fact Sheet for Healthcare Providers:  GravelBags.it  This test is no t yet approved or cleared by the [place] FDA and  has been authorized for detection and/or diagnosis of SARS-CoV-2 by FDA under an Emergency Use Authorization (EUA). This EUA will remain  in effect (meaning this test can be used) for the duration of the COVID-19 declaration under Section 564(b)(1) of the Act, 21 U.S.C. section 360bbb-3(b)(1), unless the authorization is terminated or revoked sooner.     Influenza A by PCR NEGATIVE NEGATIVE Final   Influenza B by PCR NEGATIVE NEGATIVE Final    Comment: (NOTE) The Xpert Xpress SARS-CoV-2/FLU/RSV assay is intended as an aid in  the diagnosis of influenza from Nasopharyngeal swab specimens and  should not be used as a sole basis for treatment. Nasal washings and  aspirates are unacceptable for Xpert Xpress SARS-CoV-2/FLU/RSV  testing.  Fact Sheet for Patients: PinkCheek.be  Fact Sheet for Healthcare Providers: GravelBags.it  This test is not yet approved or cleared by the [place] FDA and   has been authorized for detection and/or diagnosis of SARS-CoV-2 by  FDA under an Emergency Use Authorization (EUA). This EUA will remain  in effect (meaning this test can be used) for the duration of the  Covid-19 declaration under Section 564(b)(1) of the Act, 21  U.S.C. section 360bbb-3(b)(1), unless the authorization is  terminated or  revoked. Performed at [facility], [place] [address], [place], [place] [address]   Surgical pcr screen     Status: Abnormal   Collection Time: 12/28/19  4:50 PM   Specimen: Nasal Mucosa; Nasal Swab  Result Value Ref Range Status   MRSA, PCR NEGATIVE NEGATIVE Final   Staphylococcus aureus POSITIVE (A) NEGATIVE Final    Comment: (NOTE) The Xpert SA Assay (FDA approved for NASAL specimens in patients 25 years of age and older), is one component of a comprehensive surveillance program. It is not intended to diagnose infection nor to guide or monitor treatment. Performed at [facility], [place] [address] [place], [place] [address]      Radiology Studies: No results found.  Marzetta Board, MD, PhD [facility]  Between 7 am - 7 pm I am available, please contact me via Amion or Securechat  Between 7 pm - 7 am I am not available, please contact night coverage MD/APP via Amion

## 2020-01-03 NOTE — Progress Notes (Signed)
PT Cancellation Note  Patient Details Name: [name] MRN: [id number] DOB: 11/01/1965   Cancelled Treatment:     pt in bed receiving blood.  RN in room.  Plan was to perform bed level TE's.  Pt declined stating "the pain has clamed down" and she "just got comfortable".  "Can we do it tomorrow?" stated pt.  Will attempt to see tomorrow for mobility and TE's.  Rica Koyanagi  PTA Acute  Rehabilitation Services Pager      [phone] Office      [phone]

## 2020-01-03 NOTE — Progress Notes (Signed)
Pt refused to be changed, offered 3x thru out the night, denies that she's incontinent of urine, given PRN Norco/ Vicodin for pain @ 2005 & 0314, pt aslo refused to do a dressing change for her left foot states that she wants it to be done later today.

## 2020-01-03 NOTE — Plan of Care (Signed)
  Problem: Health Behavior/Discharge Planning: Goal: Ability to manage health-related needs will improve Outcome: Progressing   Problem: Clinical Measurements: Goal: Will remain free from infection Outcome: Progressing   Problem: Nutrition: Goal: Adequate nutrition will be maintained Outcome: Progressing   Problem: Coping: Goal: Level of anxiety will decrease Outcome: Progressing   Problem: Elimination: Goal: Will not experience complications related to bowel motility Outcome: Progressing Goal: Will not experience complications related to urinary retention Outcome: Progressing   Problem: Pain Managment: Goal: General experience of comfort will improve Outcome: Progressing   Problem: Safety: Goal: Ability to remain free from injury will improve Outcome: Progressing   Problem: Skin Integrity: Goal: Risk for impaired skin integrity will decrease Outcome: Progressing   Problem: Education: Goal: Verbalization of understanding the information provided (i.e., activity precautions, restrictions, etc) will improve Outcome: Progressing   Problem: Clinical Measurements: Goal: Postoperative complications will be avoided or minimized Outcome: Progressing   Problem: Self-Concept: Goal: Ability to maintain and perform role responsibilities to the fullest extent possible will improve Outcome: Progressing

## 2020-01-03 NOTE — Progress Notes (Signed)
Pt refused mri. RN aware. Will ordering MD. Pt states she does not need this exam.

## 2020-01-04 ENCOUNTER — Inpatient Hospital Stay (HOSPITAL_COMMUNITY): Payer: Medicaid Other

## 2020-01-04 ENCOUNTER — Telehealth: Payer: Self-pay | Admitting: Podiatry

## 2020-01-04 DIAGNOSIS — L97524 Non-pressure chronic ulcer of other part of left foot with necrosis of bone: Secondary | ICD-10-CM | POA: Diagnosis not present

## 2020-01-04 DIAGNOSIS — M86672 Other chronic osteomyelitis, left ankle and foot: Secondary | ICD-10-CM | POA: Diagnosis not present

## 2020-01-04 DIAGNOSIS — T148XXA Other injury of unspecified body region, initial encounter: Secondary | ICD-10-CM | POA: Diagnosis not present

## 2020-01-04 DIAGNOSIS — E1165 Type 2 diabetes mellitus with hyperglycemia: Secondary | ICD-10-CM | POA: Diagnosis not present

## 2020-01-04 DIAGNOSIS — D649 Anemia, unspecified: Secondary | ICD-10-CM | POA: Diagnosis not present

## 2020-01-04 DIAGNOSIS — E118 Type 2 diabetes mellitus with unspecified complications: Secondary | ICD-10-CM | POA: Diagnosis not present

## 2020-01-04 DIAGNOSIS — E8770 Fluid overload, unspecified: Secondary | ICD-10-CM | POA: Diagnosis not present

## 2020-01-04 DIAGNOSIS — N185 Chronic kidney disease, stage 5: Secondary | ICD-10-CM | POA: Diagnosis not present

## 2020-01-04 DIAGNOSIS — N179 Acute kidney failure, unspecified: Secondary | ICD-10-CM | POA: Diagnosis not present

## 2020-01-04 LAB — BASIC METABOLIC PANEL
Anion gap: 11 (ref 5–15)
BUN: 111 mg/dL — ABNORMAL HIGH (ref 6–20)
CO2: 14 mmol/L — ABNORMAL LOW (ref 22–32)
Calcium: 8.1 mg/dL — ABNORMAL LOW (ref 8.9–10.3)
Chloride: 104 mmol/L (ref 98–111)
Creatinine, Ser: 6.87 mg/dL — ABNORMAL HIGH (ref 0.44–1.00)
GFR, Estimated: 7 mL/min — ABNORMAL LOW (ref >60)
Glucose, Bld: 251 mg/dL — ABNORMAL HIGH (ref 70–99)
Potassium: 4.8 mmol/L (ref 3.5–5.1)
Sodium: 129 mmol/L — ABNORMAL LOW (ref 135–145)

## 2020-01-04 LAB — TYPE AND SCREEN
ABO/RH(D): A POS
Antibody Screen: NEGATIVE
Unit division: 0

## 2020-01-04 LAB — CBC
HCT: 26 % — ABNORMAL LOW (ref 36.0–46.0)
Hemoglobin: 8 g/dL — ABNORMAL LOW (ref 12.0–15.0)
MCH: 27.9 pg (ref 26.0–34.0)
MCHC: 30.8 g/dL (ref 30.0–36.0)
MCV: 90.6 fL (ref 80.0–100.0)
Platelets: 275 10*3/uL (ref 150–400)
RBC: 2.87 MIL/uL — ABNORMAL LOW (ref 3.87–5.11)
RDW: 15.1 % (ref 11.5–15.5)
WBC: 18.6 10*3/uL — ABNORMAL HIGH (ref 4.0–10.5)
nRBC: 0 % (ref 0.0–0.2)

## 2020-01-04 LAB — GLUCOSE, CAPILLARY
Glucose-Capillary: 248 mg/dL — ABNORMAL HIGH (ref 70–99)
Glucose-Capillary: 265 mg/dL — ABNORMAL HIGH (ref 70–99)
Glucose-Capillary: 232 mg/dL — ABNORMAL HIGH (ref 70–99)
Glucose-Capillary: 226 mg/dL — ABNORMAL HIGH (ref 70–99)
Glucose-Capillary: 243 mg/dL — ABNORMAL HIGH (ref 70–99)

## 2020-01-04 LAB — BPAM RBC
Blood Product Expiration Date: 202111272359
ISSUE DATE / TIME: 202111021525
Unit Type and Rh: 6200

## 2020-01-04 MED ORDER — SODIUM BICARBONATE 8.4 % IV SOLN: INTRAVENOUS | Status: DC

## 2020-01-04 MED ORDER — CEFAZOLIN SODIUM-DEXTROSE 1-4 GM/50ML-% IV SOLN
1.0000 g | Freq: Two times a day (BID) | INTRAVENOUS | Status: DC
  Administered 2020-01-04 – 2020-01-05 (×3): 1 g via INTRAVENOUS
  Filled 2020-01-04 (×3): qty 50

## 2020-01-04 MED ORDER — HEPARIN SODIUM (PORCINE) 5000 UNIT/ML IJ SOLN
5000.0000 [IU] | Freq: Three times a day (TID) | INTRAMUSCULAR | Status: DC
  Administered 2020-01-05 – 2020-01-09 (×14): 5000 [IU] via SUBCUTANEOUS
  Filled 2020-01-04 (×14): qty 1

## 2020-01-04 MED ORDER — MUSCLE RUB 10-15 % EX CREA: 1.0000 "application " | TOPICAL_CREAM | CUTANEOUS | Status: DC | PRN

## 2020-01-04 MED ORDER — SODIUM BICARBONATE 8.4 % IV SOLN
INTRAVENOUS | Status: DC
  Filled 2020-01-04: qty 850
  Filled 2020-01-04 (×2): qty 150
  Filled 2020-01-04: qty 850
  Filled 2020-01-04: qty 150

## 2020-01-04 MED ORDER — GUAIFENESIN-DM 100-10 MG/5ML PO SYRP: 5.0000 mL | ORAL_SOLUTION | ORAL | Status: DC | PRN

## 2020-01-04 MED ORDER — LORATADINE 10 MG PO TABS: 10.0000 mg | ORAL_TABLET | Freq: Every day | ORAL | Status: DC | PRN

## 2020-01-04 MED ORDER — ALUM & MAG HYDROXIDE-SIMETH 200-200-20 MG/5ML PO SUSP: 30.0000 mL | ORAL | Status: DC | PRN

## 2020-01-04 MED ORDER — LIP MEDEX EX OINT
1.0000 "application " | TOPICAL_OINTMENT | CUTANEOUS | Status: DC | PRN
  Filled 2020-01-04: qty 7

## 2020-01-04 MED ORDER — POLYVINYL ALCOHOL 1.4 % OP SOLN: 1.0000 [drp] | OPHTHALMIC | Status: DC | PRN

## 2020-01-04 MED ORDER — SALINE SPRAY 0.65 % NA SOLN: 1.0000 | NASAL | Status: DC | PRN

## 2020-01-04 MED ORDER — COLLAGENASE 250 UNIT/GM EX OINT
TOPICAL_OINTMENT | Freq: Every day | CUTANEOUS | Status: DC
  Administered 2020-01-07 – 2020-01-08 (×2): 1 via TOPICAL
  Filled 2020-01-04: qty 30

## 2020-01-04 NOTE — Progress Notes (Signed)
Patient is refusing MRI of the left ankle. MD Alfredia Ferguson is aware.

## 2020-01-04 NOTE — Telephone Encounter (Signed)
Hospital consult. [name]. Martinez needs to have her foot evaluated. She has an elevated white count. Infection. Refusing MRI, but does have Xray.

## 2020-01-04 NOTE — Consult Note (Signed)
Reason for Consult: left foot wound, concern for infection, elevated white count Referring Physician: [name] is an 54 y.o. female.  HPI:  This is a 54 year old female with a left foot wound.  She has leukocytosis and there was concern that this could be coming from her foot.  She reports that the wound is chronic but was worsened recently by rubbing at the central area on her prosthetic insert.  Denies nausea vomiting fever chills at present  Past Medical History:  Diagnosis Date  . Anxiety 2002  . Chest tightness   . Depression 2001  . Diabetes type 1, uncontrolled ([place])    at age 18  . Diabetic neuropathy ([place])   . Essential hypertension 2015  . GERD (gastroesophageal reflux disease)    about age of 70  . Nausea and vomiting in adult   . Stroke ([place])   . Urinary frequency   . Yeast vaginitis     Past Surgical History:  Procedure Laterality Date  . ACHILLES TENDON SURGERY Left 03/10/2013   Procedure: LEFT CHOPART AMPUTATION/ LEFT TENDON ACHILLES [place];  Surgeon: Wylene Simmer, MD;  Location: [employer];  Service: Orthopedics;  Laterality: Left;  . AMPUTATION Left 06/15/2012   Procedure: AMPUTATION DIGIT;  Surgeon: Meredith Pel, MD;  Location: [place];  Service: Orthopedics;  Laterality: Left;  Left great toe revision amputation  . ENDOMETRIAL ABLATION    . INTRAMEDULLARY (IM) NAIL INTERTROCHANTERIC Left 12/29/2019   Procedure: INTRAMEDULLARY (IM) NAIL INTERTROCHANTRIC;  Surgeon: Rod Can, MD;  Location: [facility];  Service: Orthopedics;  Laterality: Left;  . IR ANGIOGRAM EXTREMITY LEFT  12/17/2018  . IR FEM POP ART ATHERECT INC PTA MOD SED  12/17/2018  . IR RADIOLOGIST EVAL & MGMT  11/30/2018  . IR RADIOLOGIST EVAL & MGMT  12/09/2018  . IR RADIOLOGIST EVAL & MGMT  02/08/2019  . IR US GUIDE VASC ACCESS RIGHT  12/17/2018  . LAPAROSCOPIC CHOLECYSTECTOMY  01/30/2015   [facility]     Family History  Problem Relation Age of Onset  . Diabetes Mother   .  Heart attack Mother   . Heart disease Mother        before age 64  . Hypertension Mother   . Hyperlipidemia Mother   . Diabetes Father   . Heart attack Father   . Heart disease Father        before age 56  . Diabetes Sister   . Hypertension Sister     Social History:  reports that she has been smoking cigarettes. She has a 5.50 pack-year smoking history. She has never used smokeless tobacco. She reports current alcohol use. She reports that she does not use drugs.  Allergies:  Allergies  Allergen Reactions  . Lidocaine Itching  . Trazodone Swelling  . Latex Rash    Medications: I have reviewed the patient's current medications.  Results for orders placed or performed during the hospital encounter of 12/28/19 (from the past 48 hour(s))  Glucose, capillary     Status: Abnormal   Collection Time: 01/02/20 10:32 PM  Result Value Ref Range   Glucose-Capillary 158 (H) 70 - 99 mg/dL    Comment: Glucose reference range applies only to samples taken after fasting for at least 8 hours.  Glucose, capillary     Status: Abnormal   Collection Time: 01/03/20  7:29 AM  Result Value Ref Range   Glucose-Capillary 142 (H) 70 - 99 mg/dL    Comment: Glucose reference  range applies only to samples taken after fasting for at least 8 hours.  Basic metabolic panel     Status: Abnormal   Collection Time: 01/03/20  8:34 AM  Result Value Ref Range   Sodium 128 (L) 135 - 145 mmol/L   Potassium 4.9 3.5 - 5.1 mmol/L   Chloride 104 98 - 111 mmol/L   CO2 14 (L) 22 - 32 mmol/L   Glucose, Bld 158 (H) 70 - 99 mg/dL    Comment: Glucose reference range applies only to samples taken after fasting for at least 8 hours.   BUN 103 (H) 6 - 20 mg/dL    Comment: RESULTS CONFIRMED BY MANUAL DILUTION   Creatinine, Ser 6.22 (H) 0.44 - 1.00 mg/dL   Calcium 8.0 (L) 8.9 - 10.3 mg/dL   GFR, Estimated 8 (L) >60 mL/min    Comment: (NOTE) Calculated using the CKD-EPI Creatinine Equation (2021)    Anion gap 10 5 - 15     Comment: Performed at [facility], [place] [address] [place], [place] [address]  CBC     Status: Abnormal   Collection Time: 01/03/20  8:34 AM  Result Value Ref Range   WBC 28.3 (H) 4.0 - 10.5 K/uL   RBC 2.59 (L) 3.87 - 5.11 MIL/uL   Hemoglobin 7.2 (L) 12.0 - 15.0 g/dL   HCT 23.4 (L) 36 - 46 %   MCV 90.3 80.0 - 100.0 fL   MCH 27.8 26.0 - 34.0 pg   MCHC 30.8 30.0 - 36.0 g/dL   RDW 15.3 11.5 - 15.5 %   Platelets 294 150 - 400 K/uL   nRBC 0.0 0.0 - 0.2 %    Comment: Performed at [facility], [place] [address], [place], [place] [address]  Type and screen [place]     Status: None   Collection Time: 01/03/20 10:21 AM  Result Value Ref Range   ABO/RH(D) A POS    Antibody Screen NEG    Sample Expiration 01/06/2020,2359    Unit Number [id number]    Blood Component Type RED CELLS,LR    Unit division 00    Status of Unit ISSUED,FINAL    Transfusion Status OK TO TRANSFUSE    Crossmatch Result      Compatible Performed at [facility], [place] [address] [place], [place] [address]   Prepare RBC (crossmatch)     Status: None   Collection Time: 01/03/20 10:21 AM  Result Value Ref Range   Order Confirmation      ORDER PROCESSED BY BLOOD BANK Performed at [facility], [place] [address], [place], [place] [address]   Glucose, capillary     Status: Abnormal   Collection Time: 01/03/20 12:32 PM  Result Value Ref Range   Glucose-Capillary 163 (H) 70 - 99 mg/dL    Comment: Glucose reference range applies only to samples taken after fasting for at least 8 hours.  Glucose, capillary     Status: Abnormal   Collection Time: 01/03/20  4:48 PM  Result Value Ref Range   Glucose-Capillary 258 (H) 70 - 99 mg/dL    Comment: Glucose reference range applies only to samples taken after fasting for at least 8 hours.  Glucose, capillary     Status: Abnormal   Collection Time: 01/03/20  8:15 PM  Result Value  Ref Range   Glucose-Capillary 208 (H) 70 - 99 mg/dL    Comment: Glucose reference range applies only to samples  taken after fasting for at least 8 hours.  CBC     Status: Abnormal   Collection Time: 01/04/20  5:21 AM  Result Value Ref Range   WBC 18.6 (H) 4.0 - 10.5 K/uL   RBC 2.87 (L) 3.87 - 5.11 MIL/uL   Hemoglobin 8.0 (L) 12.0 - 15.0 g/dL   HCT 26.0 (L) 36 - 46 %   MCV 90.6 80.0 - 100.0 fL   MCH 27.9 26.0 - 34.0 pg   MCHC 30.8 30.0 - 36.0 g/dL   RDW 15.1 11.5 - 15.5 %   Platelets 275 150 - 400 K/uL   nRBC 0.0 0.0 - 0.2 %    Comment: Performed at [facility], [place] [address], [place], [place] [address]  Basic metabolic panel     Status: Abnormal   Collection Time: 01/04/20  5:21 AM  Result Value Ref Range   Sodium 129 (L) 135 - 145 mmol/L   Potassium 4.8 3.5 - 5.1 mmol/L   Chloride 104 98 - 111 mmol/L   CO2 14 (L) 22 - 32 mmol/L   Glucose, Bld 251 (H) 70 - 99 mg/dL    Comment: Glucose reference range applies only to samples taken after fasting for at least 8 hours.   BUN 111 (H) 6 - 20 mg/dL    Comment: RESULTS CONFIRMED BY MANUAL DILUTION   Creatinine, Ser 6.87 (H) 0.44 - 1.00 mg/dL   Calcium 8.1 (L) 8.9 - 10.3 mg/dL   GFR, Estimated 7 (L) >60 mL/min    Comment: (NOTE) Calculated using the CKD-EPI Creatinine Equation (2021)    Anion gap 11 5 - 15    Comment: Performed at [facility], [place] [address], [place], [place] [address]  Glucose, capillary     Status: Abnormal   Collection Time: 01/04/20  7:51 AM  Result Value Ref Range   Glucose-Capillary 248 (H) 70 - 99 mg/dL    Comment: Glucose reference range applies only to samples taken after fasting for at least 8 hours.  Glucose, capillary     Status: Abnormal   Collection Time: 01/04/20 11:54 AM  Result Value Ref Range   Glucose-Capillary 265 (H) 70 - 99 mg/dL    Comment: Glucose reference range applies only to samples taken after fasting for at least 8 hours.   Comment 1 Document  in Chart   Glucose, capillary     Status: Abnormal   Collection Time: 01/04/20  3:55 PM  Result Value Ref Range   Glucose-Capillary 232 (H) 70 - 99 mg/dL    Comment: Glucose reference range applies only to samples taken after fasting for at least 8 hours.  Glucose, capillary     Status: Abnormal   Collection Time: 01/04/20  5:50 PM  Result Value Ref Range   Glucose-Capillary 226 (H) 70 - 99 mg/dL    Comment: Glucose reference range applies only to samples taken after fasting for at least 8 hours.   Comment 1 Notify RN    Comment 2 Document in Chart     DG Foot 2 Views Left  Result Date: 01/03/2020 CLINICAL DATA:  Left foot cellulitis. EXAM: LEFT FOOT - 2 VIEW COMPARISON:  11/14/2019.  08/26/2019 FINDINGS: Prior left foot amputation. Soft tissue swelling noted over the stump. Subtle erosive changes about the anterior aspect of the calcaneal stump cannot be excluded. Osteomyelitis cannot be excluded. Diffuse osteopenia and degenerative change. No evidence of fracture or dislocation. IMPRESSION: 1. Prior left foot amputation. Soft tissue  swelling noted over the stump. Subtle erosive changes about the anterior aspect of the calcaneal stump cannot be excluded. Osteomyelitis cannot be excluded. 2. Diffuse osteopenia and degenerative change. Electronically Signed   By: Marcello Moores  Register   On: 01/03/2020 10:25    ROS Blood pressure (!) 135/46, pulse 84, temperature 99.5 F (37.5 C), temperature source Oral, resp. rate 18, height 5\' 10"  (1.778 m), weight 88.6 kg, SpO2 96 %.  Vitals:   01/04/20 1555 01/04/20 1652  BP: (!) 129/46 (!) 135/46  Pulse: 84   Resp:    Temp: 99.5 F (37.5 C)   SpO2:      General AA&O x3. Normal mood and affect.  Vascular Left foot warm to touch without excessive warmth or erythema.  Skin appears well-perfused pulses faintly palpable  Neurologic Epicritic sensation grossly absent.  Dermatologic (Wound) Wound Location: Left plantar amputation stump Wound Base:  Fibronecrotic Peri-wound: Calloused Exudate: None: wound tissue dry No expressible purulence, no fluctuance or crepitus no ascending cellulitis.  There is a small area of redness to the anterior ankle that is more likely skin irritation from bandaging rather than cellulitis  Orthopedic: Motor intact BLE.  Midfoot amputation noted left    Assessment/Plan:  Left foot ulceration with concern for osteomyelitis -Imaging: Studies independently reviewed.  There likely is osseous erosion of the anterior border of the calcaneus.  Would benefit from MRI for evaluation of this and to rule out further abscess formation -Antibiotics: Continue empiric therapy -WB Status: Per orthopedics for hip fracture -Wound Care: Switch to Santyl wet-to-dry daily. Orders placed. -She likely does have osteomyelitis however the foot is rather well-appearing without significant signs of infection.  There is no signs of ascending infection, however she could still have elevated white count secondary to osteomyelitis.  I discussed with her the benefit of having the MRI performed she says she will think about it.  In the meantime clinical picture does not warrant urgent surgical debridement and we could proceed right now with local wound care.  This would change should the MRI show osteomyelitis or abscess formation. -We will follow patient while in-house  [name] 01/04/2020, 6:26 PM   Best available via secure chat for questions or concerns.

## 2020-01-04 NOTE — TOC Progression Note (Signed)
Transition of Care [facility]) - Progression Note    Patient Details  Name: [name] MRN: [id number] Date of Birth: March 22, 1965  Transition of Care [facility]) CM/SW Contact  Malick Netz, Juliann Pulse, RN Phone Number: 01/04/2020, 1:58 PM  Clinical Narrative:See CIR note-denied CIR auth;PT recc SNF-patient agreed to SNF-faxed out await bed offers.Paitent has not received coiv vaccine & does not wish to receive covid vaccine while in the hospital.       Expected Discharge Plan: Skilled Nursing Facility Barriers to Discharge: Continued Medical Work up  Expected Discharge Plan and Services Expected Discharge Plan: [place]   Discharge Planning Services: CM Consult Post Acute Care Choice: IP Rehab Living arrangements for the past 2 months: Single Family Home                                       Social Determinants of Health (SDOH) Interventions    Readmission Risk Interventions No flowsheet data found.

## 2020-01-04 NOTE — Telephone Encounter (Signed)
Patient was seen and evaluated

## 2020-01-04 NOTE — NC FL2 (Signed)
[place] LEVEL OF CARE SCREENING TOOL     IDENTIFICATION  Patient Name: [name] Birthdate: [birth year] Sex: female Admission Date (Current Location): 12/28/2019  [facility] and [place] Number:  Herbalist and Address:  [facility],  [place], [place]      Provider Number: [id number]  Attending Physician Name and Address:  Kerney Elbe, DO  Relative Name and Phone Number:  Maicy Filip (dtr) [phone]    Current Level of Care: Hospital Recommended Level of Care: [place] Prior Approval Number:    Date Approved/Denied:   PASRR Number:    Discharge Plan: SNF    Current Diagnoses: Patient Active Problem List   Diagnosis Date Noted  . Fracture 12/28/2019  . AKI (acute kidney injury) ([place]) 12/28/2019  . Volume overload 12/28/2019  . Acute on chronic anemia 12/28/2019  . CKD (chronic kidney disease) stage 5, GFR less than 15 ml/min (HCC) 12/06/2019  . History of Chopart amputation of left foot ([place]) 12/25/2017  . Cerebral thrombosis with cerebral infarction 04/08/2017  . Acute right-sided weakness 04/07/2017  . Slurred speech 04/07/2017  . Right sided weakness 04/07/2017  . Hyperhidrosis 09/01/2016  . Migraine with aura and without status migrainosus, not intractable 07/28/2016  . 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 CKD stage 3 due to type 2 diabetes mellitus ([place]) 06/27/2014  . Vitamin D insufficiency 05/08/2014  . Obesity (BMI 30.0-34.9) 05/08/2014  . 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 Bursitis of left shoulder 02/14/2014  . Neck pain 01/03/2014  . Diabetes mellitus type 2, uncontrolled, with complications ([place]) 05/39/7673  . 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.23) 01/14/2013  . Insomnia 08/04/2012  . Diabetic neuropathy, painful ([place]) 08/04/2011  . Anxiety and depression 05/16/2010  .  Essential hypertension, benign 11/01/2007  . Female stress incontinence 11/01/2007  . TOBACCO DEPENDENCE 04/30/2006  . GASTROESOPHAGEAL REFLUX, NO ESOPHAGITIS 04/30/2006  . Irritable bowel syndrome 04/30/2006    Orientation RESPIRATION BLADDER Height & Weight     Self, Time, Situation, Place  Normal Incontinent (Occasional) Weight: 88.6 kg Height:  5\' 10"  (177.8 cm)  BEHAVIORAL SYMPTOMS/MOOD NEUROLOGICAL BOWEL NUTRITION STATUS      Incontinent (Occasional) Diet (CHO MOD)  AMBULATORY STATUS COMMUNICATION OF NEEDS Skin   Limited Assist Verbally Surgical wounds (L hip surgical site;L foot chopart-dsg changes-betadine,gauze,kerlix qd)                       Personal Care Assistance Level of Assistance  Bathing, Dressing Bathing Assistance: Limited assistance   Dressing Assistance: Limited assistance     Functional Limitations Info  Sight, Speech, Hearing Sight Info: Adequate Hearing Info: Adequate Speech Info: Adequate    SPECIAL CARE FACTORS FREQUENCY  PT (By licensed PT), OT (By licensed OT)     PT Frequency: 5x week OT Frequency: 5x week            Contractures Contractures Info: Not present    Additional Factors Info  Code Status, Allergies, Psychotropic, Insulin Sliding Scale Code Status Info:  (Full code) Allergies Info:  (Lidocaine, Trazodone, Latex) Psychotropic Info:  (Cymbalta-see MAR) Insulin Sliding Scale Info:  (SSI-see MAR)       Current Medications (01/04/2020):  This is the current hospital active medication list Current Facility-Administered Medications  Medication Dose Route Frequency Provider Last Rate Last Admin  . (feeding supplement) PROSource Plus liquid 30  mL  30 mL Oral TID BM Rod Can, MD   30 mL at 01/03/20 2134  . 0.9 %  sodium chloride infusion   Intravenous PRN Wynonia Musty, RN 10 mL/hr at 12/29/19 2300 Rate Verify at 12/29/19 2300  . alosetron (LOTRONEX) tablet 1 mg  1 mg Oral BID Rod Can, MD   1 mg at  01/04/20 1054  . alum & mag hydroxide-simeth (MAALOX/MYLANTA) 200-200-20 MG/5ML suspension 30 mL  30 mL Oral Q4H PRN Lang Snow, FNP      . amLODipine (NORVASC) tablet 10 mg  10 mg Oral Daily Caren Griffins, MD   10 mg at 01/04/20 0940  . atorvastatin (LIPITOR) tablet 40 mg  40 mg Oral q1800 Rod Can, MD   40 mg at 01/03/20 1707  . calcium carbonate (TUMS - dosed in mg elemental calcium) chewable tablet 200 mg of elemental calcium  1 tablet Oral TID PRN Lang Snow, FNP   200 mg of elemental calcium at 12/30/19 0834  . carvedilol (COREG) tablet 25 mg  25 mg Oral BID WC Caren Griffins, MD   25 mg at 01/04/20 0906  . Chlorhexidine Gluconate Cloth 2 % PADS 6 each  6 each Topical Daily Caren Griffins, MD   6 each at 01/04/20 1054  . cyclobenzaprine (FLEXERIL) tablet 10 mg  10 mg Oral BID PRN Rod Can, MD   10 mg at 12/29/19 1831  . docusate sodium (COLACE) capsule 100 mg  100 mg Oral BID Rod Can, MD   100 mg at 01/01/20 2204  . DULoxetine (CYMBALTA) DR capsule 60 mg  60 mg Oral Daily Rod Can, MD   60 mg at 01/04/20 0940  . guaiFENesin-dextromethorphan (ROBITUSSIN DM) 100-10 MG/5ML syrup 5 mL  5 mL Oral Q4H PRN Lang Snow, FNP      . [START ON 01/05/2020] heparin injection 5,000 Units  5,000 Units Subcutaneous Q8H Sheikh, Omair Latif, DO      . hydrALAZINE (APRESOLINE) tablet 10 mg  10 mg Oral Q6H PRN Caren Griffins, MD   10 mg at 12/31/19 0454  . hydrALAZINE (APRESOLINE) tablet 100 mg  100 mg Oral TID Caren Griffins, MD   100 mg at 01/04/20 0938  . HYDROcodone-acetaminophen (NORCO/VICODIN) 5-325 MG per tablet 1-2 tablet  1-2 tablet Oral Q6H PRN Rod Can, MD   2 tablet at 01/04/20 1053  . insulin aspart (novoLOG) injection 0-9 Units  0-9 Units Subcutaneous TID WC Rod Can, MD   5 Units at 01/04/20 1311  . insulin aspart (novoLOG) injection 3 Units  3 Units Subcutaneous TID WC Caren Griffins, MD   3 Units at 01/04/20 1313  . insulin  glargine (LANTUS) injection 20 Units  20 Units Subcutaneous QHS Caren Griffins, MD   20 Units at 01/03/20 2134  . lip balm (CARMEX) ointment 1 application  1 application Topical PRN Lang Snow, FNP      . loratadine (CLARITIN) tablet 10 mg  10 mg Oral Daily PRN Lang Snow, FNP      . menthol-cetylpyridinium (CEPACOL) lozenge 3 mg  1 lozenge Oral PRN Swinteck, Aaron Edelman, MD       Or  . phenol (CHLORASEPTIC) mouth spray 1 spray  1 spray Mouth/Throat PRN Swinteck, Aaron Edelman, MD      . metoCLOPramide (REGLAN) tablet 5-10 mg  5-10 mg Oral Q8H PRN Swinteck, Aaron Edelman, MD       Or  . metoCLOPramide (REGLAN) injection 5-10 mg  5-10 mg Intravenous  Q8H PRN Swinteck, Aaron Edelman, MD      . morphine 2 MG/ML injection 0.5 mg  0.5 mg Intravenous Q2H PRN Rod Can, MD   0.5 mg at 01/03/20 1931  . multivitamin (RENA-VIT) tablet 1 tablet  1 tablet Oral QHS Rod Can, MD   1 tablet at 01/03/20 2134  . Muscle Rub CREA 1 application  1 application Topical PRN Lang Snow, FNP      . ondansetron [facility]) tablet 4 mg  4 mg Oral Q6H PRN Swinteck, Aaron Edelman, MD       Or  . ondansetron (ZOFRAN) injection 4 mg  4 mg Intravenous Q6H PRN Swinteck, Aaron Edelman, MD      . pantoprazole (PROTONIX) EC tablet 40 mg  40 mg Oral Daily Rod Can, MD   40 mg at 01/04/20 0937  . polyvinyl alcohol (LIQUIFILM TEARS) 1.4 % ophthalmic solution 1 drop  1 drop Both Eyes PRN Lang Snow, FNP      . senna-docusate (Senokot-S) tablet 1 tablet  1 tablet Oral QHS PRN Swinteck, Aaron Edelman, MD      . sodium bicarbonate 150 mEq in dextrose 5 % 1,000 mL infusion   Intravenous Continuous Minda Ditto, RPH 75 mL/hr at 01/04/20 0904 New Bag at 01/04/20 0904  . sodium chloride (OCEAN) 0.65 % nasal spray 1 spray  1 spray Each Nare PRN Lang Snow, FNP         Discharge Medications: Please see discharge summary for a list of discharge medications.  Relevant Imaging Results:  Relevant Lab Results:   Additional Information [id number], Juliann Pulse, [place]

## 2020-01-04 NOTE — Progress Notes (Signed)
PROGRESS NOTE    [name]  ZYS:[id number] DOB: 09-27-1965 DOA: 12/28/2019 PCP: Julian Hy, PA-C    Brief Narrative:   The patient 54 year old female with DM, HTN, CKD 5, status post left foot Chopart amputation 2014 managed by podiatry, left SFA vascularization in 2020, prior CVA who came into the hospital after a ground-level fall resulting in left hip fracture.  Orthopedic surgery has consulted and she is status post operative repair 10/28.  Nephrology also consulted on admission due to chronic kidney disease and signed off since.  She is awaiting insurance authorization for CIR, ongoing problems are leukocytosis, renal failure, anemia.  Insurance authorization was unfortunately denied for CIR so we will attempt to have the patient go to SNF.  Will call podiatry for further evaluation recommendations given her stump.  Patient adamantly is refusing MRI of her foot.  Discussed with ID who recommends empiric antibiotic treatment with Ancef and obtaining a CRP and a ESR.  We will also consult podiatry for further evaluation and recommendations.  Assessment & Plan:   Principal Problem:   Fracture Active Problems:   Diabetes mellitus type 2, uncontrolled, with complications (HCC)   CKD (chronic kidney disease) stage 5, GFR less than 15 ml/min (HCC)   AKI (acute kidney injury) (HCC)   Volume overload   Acute on chronic anemia   Intertrochanteric fracture of the proximal left femur with avulsion of the lesser trochanter status post mechanical fall -orthopedic surgery consulted, she is status post intramedullary fixation of the left femur by Dr. Lyla Glassing on 10/28 -PT recommended CIR, patient agreeable, insurance authorization was unfortunately denied and now social work is attempting to look for SNF PT continues recommend rehab  Acute on Chronic Anemia of Chronic Kidney Disease, postoperative acute blood loss anemia -hemoglobin dropped to 6.3, status post 2 units of packed red  blood cells.  She was given Lasix x1.  -Hemoglobin dipping down over the last couple of days, 7.5 yesterday and even lower yesterday at 7.2. -She has been transfused 3 units of PRBCs and repeat hemoglobin/hematocrit this morning was 8.0/26.0 -Continue to monitor for signs and symptoms of bleeding; Currently no overt bleeding -Repeat CBC in the AM   Leukocytosis -Likely reactive in the setting of hip fracture, however was improving and then her WBC is back up today.   -WBC went from 17.6 -> 28.3 -> 18.6 -She has no infectious symptoms currently.  She did have diarrhea a few days ago.   -Her foot does not look particularly infected but pictures reviewed by ID -Last month her Foot Cx showed Staph and Strep G,  -Obtained an x-ray which showed "Prior left foot amputation. Soft tissue swelling noted over the stump. Subtle erosive changes about the anterior aspect of the calcaneal stump cannot be excluded. Osteomyelitis cannot be excluded. Diffuse osteopenia and degenerative change." -Dr. Cruzita Lederer ordered an MRI however patient refuses stating "I do not think I need this test".  It was noted that an MRI has been ordered as an outpatient also about 2 weeks ago and does not want to have it done inpatient -Will discuss with ID but patient is adamant about not getting MRI done; ESR and CRP recommended to be ordered by Dr. Baxter Flattery; Will start Ancef per her Recommendation and see response -Will also consult Podiatry for further evaluation and management   Hyponatremia -Patient's sodium was 131 and on repeat it trended down to 128 this morning is 129 -Continue to Monitor and Trend   Acute Kidney  Injury on Chronic Kidney Disease Stage V  Metabolic Acidosis -baseline creatinine around 4, her creatinine was in the six range on admission.   -Nephrology evaluated patient.  Due to lower extremity swelling she was diuresed with Lasix x2, 10/29-30.   -Creatinine has remained around 6 which may be her new baseline.   She has no uremic symptoms. -BUN/Cr is climbing some today.  -Started Sodium Bicarbonate Infusion at 75 mL/hr -Avoid further nephrotoxic medications, contrast dyes, hypotension and renally dose medications -Continue to monitor and trend renal function carefully -Await further recommendations from nephrology -Has seen Vascular Surgery for Access in the past   Volume overload with bilateral lower extremity edema, improved  -improving with 2 doses of Lasix.  Swelling improved, hold further diuresis and now started back on IV fluids with sodium bicarbonate at 75 MLS per hour given her renal function  Type 2 Diabetes Mellitus -Continue Lantus 20 units subcu nightly, sensitive NovoLog/scale insulin AC, 3 units of subcu insulin 3 times daily with meals CBGs are stable -CBG (last 3)  Recent Labs    01/03/20 2015 01/04/20 0751 01/04/20 1154  GLUCAP 208* 248* 265*  -Continue monitor blood sugars carefully and adjust insulin regimen as necessary  Essential Hypertension -required up titration of her antihypertensive regimen for couple days neuro, finally blood pressure better -Continue with carvedilol 25 mg p.o. twice daily, amlodipine 10 mg p.o. 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 Arman Filter note  DVT prophylaxis: Heparin Sq 5000 units q8h Code Status: FULL CODE Family Communication: No family present at bedside  Disposition Plan: Anticipating D/C to SNF  Status is: Inpatient  Remains inpatient appropriate because:Unsafe d/c plan, IV treatments appropriate due to intensity of illness or inability to take PO and Inpatient level of care appropriate due to severity of illness   Dispo: The patient is from: Home              Anticipated d/c is to: SNF              Anticipated d/c date is: 2 days              Patient currently is not medically stable to d/c.  Consultants:    Orthopedic Surgery  Nephrology  Podiatry    Procedures: None  Antimicrobials:  Anti-infectives (From admission, onward)   Start     Dose/Rate Route Frequency Ordered Stop   12/29/19 1930  ceFAZolin (ANCEF) IVPB 2g/100 mL premix        2 g 200 mL/hr over 30 Minutes Intravenous Every 6 hours 12/29/19 1620 12/30/19 0201   12/29/19 1300  ceFAZolin (ANCEF) IVPB 2g/100 mL premix        2 g 200 mL/hr over 30 Minutes Intravenous On call to O.R. 12/29/19 0113 12/29/19 1325       Subjective: Seen And examined and states that she has some pain in her hip.  No nausea or vomiting.  Does not want an MRI of her foot and refuses to do this while as an inpatient.  No chest pain, lightheadedness or dizziness.  Still awaiting placement.  No other concerns or complaints at this time.  Case was discussed with ID and they recommended empiric antibiotics with Ancef to see response will obtain blood cultures prior to initiating antibiotics  Objective: Vitals:   01/03/20 1545 01/03/20 1856 01/03/20 2018 01/04/20 0451  BP: (!) 128/45 Marland Kitchen)  135/53 (!) 140/50 (!) 152/53  Pulse: 67 64 63 68  Resp: '16  17 18  ' Temp: 98.4 F (36.9 C) 97.7 F (36.5 C) 98.2 F (36.8 C) 97.8 F (36.6 C)  TempSrc: Oral Oral Oral Oral  SpO2: 96% 99% 97% 96%  Weight:      Height:        Intake/Output Summary (Last 24 hours) at 01/04/2020 0755 Last data filed at 01/03/2020 1851 Gross per 24 hour  Intake 425.83 ml  Output 400 ml  Net 25.83 ml   Filed Weights   12/28/19 1047 12/28/19 1526 12/29/19 1147  Weight: 89.8 kg 88.6 kg 88.6 kg   Examination: Physical Exam:  Constitutional: WN/WD overweight Caucasian female currently in no acute distress complaining some left hip pain Eyes: Lids and conjunctivae normal, sclerae anicteric  ENMT: External Ears, Nose appear normal. Grossly normal hearing.  Neck: Appears normal, supple, no cervical masses, normal ROM, no appreciable thyromegaly: No JVD Respiratory: Diminished to  auscultation bilaterally, no wheezing, rales, rhonchi or crackles. Normal respiratory effort and patient is not tachypenic. No accessory muscle use.  Unlabored breathing Cardiovascular: RRR, no murmurs / rubs / gallops. S1 and S2 auscultated.  No appreciable extremity edema Abdomen: Soft, non-tender, slightly distended secondary body habitus. Bowel sounds positive.  GU: Deferred. Musculoskeletal: No clubbing / cyanosis of digits/nails.  Has a left foot chopart amputation that is wrapped Skin: No rashes, lesions, ulcers on limited skin evaluation. No induration; Warm and dry.  Neurologic: CN 2-12 grossly intact with no focal deficits. Romberg sign and cerebellar reflexes not assessed.  Psychiatric: Normal judgment and insight. Alert and oriented x 3. Normal mood and appropriate affect.   Data Reviewed: I have personally reviewed following labs and imaging studies  CBC: Recent Labs  Lab 12/28/19 1200 12/29/19 0402 12/31/19 0518 01/01/20 0612 01/02/20 0844 01/03/20 0834 01/04/20 0521  WBC 17.6*   < > 23.6* 16.0* 17.6* 28.3* 18.6*  NEUTROABS 15.3*  --   --   --   --   --   --   HGB 6.3*   < > 9.0* 8.1* 7.5* 7.2* 8.0*  HCT 20.5*   < > 28.7* 25.7* 24.3* 23.4* 26.0*  MCV 87.6   < > 87.5 88.6 89.3 90.3 90.6  PLT 403*   < > 477* 376 324 294 275   < > = values in this interval not displayed.   Basic Metabolic Panel: Recent Labs  Lab 12/31/19 0518 01/01/20 0612 01/02/20 0844 01/03/20 0834 01/04/20 0521  NA 134* 134* 131* 128* 129*  K 4.5 4.4 4.8 4.9 4.8  CL 104 106 105 104 104  CO2 18* 16* 15* 14* 14*  GLUCOSE 222* 139* 171* 158* 251*  BUN 76* 89* 87* 103* 111*  CREATININE 5.68* 5.98* 6.17* 6.22* 6.87*  CALCIUM 8.3* 8.1* 7.9* 8.0* 8.1*   GFR: Estimated Creatinine Clearance: 11.6 mL/min (A) (by C-G formula based on SCr of 6.87 mg/dL (H)). Liver Function Tests: Recent Labs  Lab 12/30/19 0415  AST 16  ALT 15  ALKPHOS 168*  BILITOT 0.8  PROT 7.1  ALBUMIN 2.4*   No results  for input(s): LIPASE, AMYLASE in the last 168 hours. No results for input(s): AMMONIA in the last 168 hours. Coagulation Profile: Recent Labs  Lab 12/28/19 1200  INR 1.1   Cardiac Enzymes: No results for input(s): CKTOTAL, CKMB, CKMBINDEX, TROPONINI in the last 168 hours. BNP (last 3 results) No results for input(s): PROBNP in the last 8760 hours. HbA1C:  No results for input(s): HGBA1C in the last 72 hours. CBG: Recent Labs  Lab 01/03/20 0729 01/03/20 1232 01/03/20 1648 01/03/20 2015 01/04/20 0751  GLUCAP 142* 163* 258* 208* 248*   Lipid Profile: No results for input(s): CHOL, HDL, LDLCALC, TRIG, CHOLHDL, LDLDIRECT in the last 72 hours. Thyroid Function Tests: No results for input(s): TSH, T4TOTAL, FREET4, T3FREE, THYROIDAB in the last 72 hours. Anemia Panel: No results for input(s): VITAMINB12, FOLATE, FERRITIN, TIBC, IRON, RETICCTPCT in the last 72 hours. Sepsis Labs: No results for input(s): PROCALCITON, LATICACIDVEN in the last 168 hours.  Recent Results (from the past 240 hour(s))  Respiratory Panel by RT PCR (Flu A&B, Covid) - Nasopharyngeal Swab     Status: None   Collection Time: 12/28/19 12:00 PM   Specimen: Nasopharyngeal Swab  Result Value Ref Range Status   SARS Coronavirus 2 by RT PCR NEGATIVE NEGATIVE Final    Comment: (NOTE) SARS-CoV-2 target nucleic acids are NOT DETECTED.  The SARS-CoV-2 RNA is generally detectable in upper respiratoy specimens during the acute phase of infection. The lowest concentration of SARS-CoV-2 viral copies this assay can detect is 131 copies/mL. A negative result does not preclude SARS-Cov-2 infection and should not be used as the sole basis for treatment or other patient management decisions. A negative result may occur with  improper specimen collection/handling, submission of specimen other than nasopharyngeal swab, presence of viral mutation(s) within the areas targeted by this assay, and inadequate number of viral  copies (<131 copies/mL). A negative result must be combined with clinical observations, patient history, and epidemiological information. The expected result is Negative.  Fact Sheet for Patients:  PinkCheek.be  Fact Sheet for Healthcare Providers:  GravelBags.it  This test is no t yet approved or cleared by the [place] FDA and  has been authorized for detection and/or diagnosis of SARS-CoV-2 by FDA under an Emergency Use Authorization (EUA). This EUA will remain  in effect (meaning this test can be used) for the duration of the COVID-19 declaration under Section 564(b)(1) of the Act, 21 U.S.C. section 360bbb-3(b)(1), unless the authorization is terminated or revoked sooner.     Influenza A by PCR NEGATIVE NEGATIVE Final   Influenza B by PCR NEGATIVE NEGATIVE Final    Comment: (NOTE) The Xpert Xpress SARS-CoV-2/FLU/RSV assay is intended as an aid in  the diagnosis of influenza from Nasopharyngeal swab specimens and  should not be used as a sole basis for treatment. Nasal washings and  aspirates are unacceptable for Xpert Xpress SARS-CoV-2/FLU/RSV  testing.  Fact Sheet for Patients: PinkCheek.be  Fact Sheet for Healthcare Providers: GravelBags.it  This test is not yet approved or cleared by the [place] FDA and  has been authorized for detection and/or diagnosis of SARS-CoV-2 by  FDA under an Emergency Use Authorization (EUA). This EUA will remain  in effect (meaning this test can be used) for the duration of the  Covid-19 declaration under Section 564(b)(1) of the Act, 21  U.S.C. section 360bbb-3(b)(1), unless the authorization is  terminated or revoked. Performed at [facility], [place] [address], [place], [place] [address]   Surgical pcr screen     Status: Abnormal   Collection Time: 12/28/19  4:50 PM   Specimen: Nasal Mucosa;  Nasal Swab  Result Value Ref Range Status   MRSA, PCR NEGATIVE NEGATIVE Final   Staphylococcus aureus POSITIVE (A) NEGATIVE Final    Comment: (NOTE) The Xpert SA Assay (FDA approved for NASAL specimens in patients 22 years of  age and older), is one component of a comprehensive surveillance program. It is not intended to diagnose infection nor to guide or monitor treatment. Performed at [facility], [place] [address], [place], [place] [address]      RN Pressure Injury Documentation:     Estimated body mass index is 28.03 kg/m as calculated from the following:   Height as of this encounter: '5\' 10"'  (1.778 m).   Weight as of this encounter: 88.6 kg.  Malnutrition Type:  Nutrition Problem: Increased nutrient needs Etiology: post-op healing   Malnutrition Characteristics:  Signs/Symptoms: estimated needs   Nutrition Interventions:  Interventions: Other (Comment) (rena-vite, prosource plus)   Radiology Studies: DG Foot 2 Views Left  Result Date: 01/03/2020 CLINICAL DATA:  Left foot cellulitis. EXAM: LEFT FOOT - 2 VIEW COMPARISON:  11/14/2019.  08/26/2019 FINDINGS: Prior left foot amputation. Soft tissue swelling noted over the stump. Subtle erosive changes about the anterior aspect of the calcaneal stump cannot be excluded. Osteomyelitis cannot be excluded. Diffuse osteopenia and degenerative change. No evidence of fracture or dislocation. IMPRESSION: 1. Prior left foot amputation. Soft tissue swelling noted over the stump. Subtle erosive changes about the anterior aspect of the calcaneal stump cannot be excluded. Osteomyelitis cannot be excluded. 2. Diffuse osteopenia and degenerative change. Electronically Signed   By: Marcello Moores  Register   On: 01/03/2020 10:25   Scheduled Meds: . (feeding supplement) PROSource Plus  30 mL Oral TID BM  . alosetron  1 mg Oral BID  . amLODipine  10 mg Oral Daily  . atorvastatin  40 mg Oral q1800  . carvedilol  25 mg Oral BID WC   . Chlorhexidine Gluconate Cloth  6 each Topical Daily  . docusate sodium  100 mg Oral BID  . DULoxetine  60 mg Oral Daily  . enoxaparin (LOVENOX) injection  30 mg Subcutaneous Q24H  . hydrALAZINE  100 mg Oral TID  . insulin aspart  0-9 Units Subcutaneous TID WC  . insulin aspart  3 Units Subcutaneous TID WC  . insulin glargine  20 Units Subcutaneous QHS  . multivitamin  1 tablet Oral QHS  . pantoprazole  40 mg Oral Daily   Continuous Infusions: . sodium chloride 10 mL/hr at 12/29/19 2300  . sodium bicarbonate (isotonic) 150 mEq in D5W 1000 mL infusion      LOS: 7 days   Kerney Elbe, DO [facility] PAGER is on [place]  If 7PM-7AM, please contact night-coverage [email or web address]

## 2020-01-04 NOTE — Progress Notes (Signed)
Physical Therapy Treatment Patient Details Name: [name] MRN: [id number] DOB: Feb 06, 1966 Today's Date: 01/04/2020    History of Present Illness 54 year old female with DM, HTN, CKD 5, status post left foot Chopart amputation 2014 managed by podiatry, left SFA vascularization in 2020, prior CVA who came into the hospital after a ground-level fall resulting in left hip fracture,   S/P Intramedullary fixation, Left femur on 10/28.    PT Comments    Goals of treatment: to transfer to a WC. Attempted use of sliding board with some difficulty scooting. Patient requested to stand at [facility], requiring min +2 from bed. Patient has  Difficulty moving left  fot to take steps with decreased L leg support. Patient has to pivot  And scot on the right foot to turn enough to sit down in WC. Patient reports that she will be home alone at times, daughter works. Patient currently requiring assitance for all aspects of ADL's and an benefit from post acute  Rehab.  Follow Up Recommendations  SNF;Supervision/Assistance - 24 hour     Equipment Recommendations  Wheelchair (measurements PT);Wheelchair cushion (measurements PT);Rolling walker with 5" wheels    Recommendations for Other Services       Precautions / Restrictions Precautions Precautions: Fall Precaution Comments: has been limited WB on L foot plantar due to wound, using Knee walker PTA. Restrictions Weight Bearing Restrictions: Yes LLE Weight Bearing: Weight bearing as tolerated Other Position/Activity Restrictions: WBAT on Left hip but has been essentially NWB on L plantar PTA    Mobility  Bed Mobility Overal bed mobility: Needs Assistance Bed Mobility: Supine to Sit     Supine to sit: Min assist     General bed mobility comments: Improved transfer today. Min assist for LLE to transfer to side of bed. Patient needed increased time and use of bed rail.  Transfers Overall transfer level: Needs assistance Equipment used: Rolling  walker (2 wheeled);None Transfers: Sit to/from [facility] Sit to Stand: Min assist;From elevated surface;+2 safety/equipment Stand pivot transfers: Min assist       General transfer comment: Min assist to stand from elevated bed surface with RW. Verbal cues for technqiue. Min assist with +2 for safety to scoot on right foot only as unable to bear weight on left to actually take a step.  Ambulation/Gait                 Stairs             Wheelchair Mobility    Modified Rankin (Stroke Patients Only)       Balance Overall balance assessment: History of Falls;Needs assistance Sitting-balance support: Feet supported;No upper extremity supported Sitting balance-Leahy Scale: Good     Standing balance support: Bilateral upper extremity supported;During functional activity Standing balance-Leahy Scale: Poor Standing balance comment: reliant on UEs                            Cognition Arousal/Alertness: Awake/alert Behavior During Therapy: WFL for tasks assessed/performed;Flat affect Overall Cognitive Status: Within Functional Limits for tasks assessed                                        Exercises      General Comments        Pertinent Vitals/Pain Pain Assessment: Faces Faces Pain Scale: Hurts little more Pain Location: Left  Hip Pain Descriptors / Indicators: Guarding;Grimacing;Sore Pain Intervention(s): Monitored during session;Premedicated before session    Home Living                      Prior Function            PT Goals (current goals can now be found in the care plan section) Acute Rehab PT Goals Patient Stated Goal: to go home Progress towards PT goals: Progressing toward goals    Frequency    Min 6X/week      PT Plan Discharge plan needs to be updated    Co-evaluation PT/OT/SLP Co-Evaluation/Treatment: Yes Reason for Co-Treatment: For patient/therapist safety;To address  functional/ADL transfers PT goals addressed during session: Mobility/safety with mobility OT goals addressed during session: Other (comment) (functional mobility in preparation for ADLs)      AM-PAC PT "6 Clicks" Mobility   Outcome Measure  Help needed turning from your back to your side while in a flat bed without using bedrails?: A Little Help needed moving from lying on your back to sitting on the side of a flat bed without using bedrails?: A Little Help needed moving to and from a bed to a chair (including a wheelchair)?: A Little Help needed standing up from a chair using your arms (e.g., wheelchair or bedside chair)?: A Little Help needed to walk in hospital room?: A Lot Help needed climbing 3-5 steps with a railing? : Total 6 Click Score: 15    End of Session Equipment Utilized During Treatment: Gait belt Activity Tolerance: Patient tolerated treatment well Patient left: in chair;with call bell/phone within reach;with chair alarm set;with nursing/sitter in room Nurse Communication: Mobility status PT Visit Diagnosis: Difficulty in walking, not elsewhere classified (R26.2);Pain Pain - Right/Left: Left Pain - part of body: Hip     Time: 1140-1205 PT Time Calculation (min) (ACUTE ONLY): 25 min  Charges:  $Therapeutic Activity: 8-22 mins                     [name] PT Acute Rehabilitation Services Pager [phone] Office [phone]    [name] 01/04/2020, 12:42 PM

## 2020-01-04 NOTE — Progress Notes (Signed)
Physical Therapy Treatment Patient Details Name: [name] MRN: [id number] DOB: 1966/01/21 Today's Date: 01/04/2020    History of Present Illness 54 year old female with DM, HTN, CKD 5, status post left foot Chopart amputation 2014 managed by podiatry, left SFA vascularization in 2020, prior CVA who came into the hospital after a ground-level fall resulting in left hip fracture,   S/P Intramedullary fixation, Left femur on 10/28.    PT Comments    Patient required 2 max assist to stand from [facility] and pivot to bed, could not back up so bed brought up to patient. Patient will benefit from SNF for rehab.  Follow Up Recommendations  SNF;Supervision/Assistance - 24 hour     Equipment Recommendations  Wheelchair (measurements PT);Wheelchair cushion (measurements PT);Rolling walker with 5" wheels    Recommendations for Other Services       Precautions / Restrictions Precautions Precautions: Fall Precaution Comments: has been limited WB on L foot plantar due to wound, using Knee walker PTA. Restrictions Weight Bearing Restrictions: Yes LLE Weight Bearing: Weight bearing as tolerated Other Position/Activity Restrictions: WBAT on Left hip but has been essentially NWB on L plantar PTA    Mobility  Bed Mobility Overal bed mobility: Needs Assistance Bed Mobility: Sit to Supine      Sit to supine: Total assist;+2 for physical assistance;+2 for safety/equipment   General bed mobility comments: assist with both legs onto bed, assist with trunk, pt. moaning out in pain  Transfers Overall transfer level: Needs assistance Equipment used: Rolling walker (2 wheeled);None Transfers: Sit to/from [facility] Sit to Stand: Max assist;+2 physical assistance;+2 safety/equipment Stand pivot transfers: Mod assist;+2 physical assistance;+2 safety/equipment       General transfer comment: max assist to stand from [facility], cues for hand placement.  Very slow to shuffle on right  foot. Turned but unable to back up. BED BROUGHT UP TO PATIENT FOR SAFETY.  Ambulation/Gait                 Stairs             Wheelchair Mobility    Modified Rankin (Stroke Patients Only)       Balance Overall balance assessment: History of Falls;Needs assistance Sitting-balance support: Feet supported;No upper extremity supported Sitting balance-Leahy Scale: Good     Standing balance support: Bilateral upper extremity supported;During functional activity Standing balance-Leahy Scale: Poor Standing balance comment: reliant on UEs                            Cognition Arousal/Alertness: Awake/alert Behavior During Therapy: WFL for tasks assessed/performed;Flat affect Overall Cognitive Status: Within Functional Limits for tasks assessed                                        Exercises      General Comments        Pertinent Vitals/Pain Pain Assessment: Faces Faces Pain Scale: Hurts even more Pain Location: Left Hip Pain Descriptors / Indicators: Guarding;Grimacing;Sore Pain Intervention(s): Premedicated before session    Home Living                      Prior Function            PT Goals (current goals can now be found in the care plan section) Acute Rehab PT Goals Patient Stated Goal:  to go home Progress towards PT goals: Progressing toward goals    Frequency    Min 6X/week      PT Plan Current plan remains appropriate    Co-evaluation PT/OT/SLP Co-Evaluation/Treatment: Yes Reason for Co-Treatment: For patient/therapist safety;To address functional/ADL transfers PT goals addressed during session: Mobility/safety with mobility OT goals addressed during session: Other (comment) (functional mobility in preparation for ADLs)      AM-PAC PT "6 Clicks" Mobility   Outcome Measure  Help needed turning from your back to your side while in a flat bed without using bedrails?: A Lot Help needed moving  from lying on your back to sitting on the side of a flat bed without using bedrails?: A Lot Help needed moving to and from a bed to a chair (including a wheelchair)?: A Lot Help needed standing up from a chair using your arms (e.g., wheelchair or bedside chair)?: A Lot Help needed to walk in hospital room?: Total Help needed climbing 3-5 steps with a railing? : Total 6 Click Score: 10    End of Session Equipment Utilized During Treatment: Gait belt Activity Tolerance: Patient limited by fatigue Patient left: with call bell/phone within reach;with nursing/sitter in room;in bed Nurse Communication: Mobility status PT Visit Diagnosis: Difficulty in walking, not elsewhere classified (R26.2);Pain Pain - Right/Left: Left Pain - part of body: Hip     Time: 7654-6503 PT Time Calculation (min) (ACUTE ONLY): 23 min  Charges:  $Therapeutic Activity: 23-37 mins                     [name] PT Acute Rehabilitation Services Pager [phone] Office [phone]    [name] 01/04/2020, 4:02 PM

## 2020-01-04 NOTE — Progress Notes (Signed)
Pharmacy Antibiotic Note  [name] is a 54 y.o. female with CKD and s/p left chopart amputation in 2014, presented to the ED  on 12/28/2019 s/p fall with femur fracture.  She underwent left femur intramedullary fixation on 10/28.  Pharmacy is consulted to start ancef for wound infection.  - 11/2 left foot xray: Prior left foot amputation. Soft tissue swelling noted over the stump. Subtle erosive changes about the anterior aspect of the calcaneal stump cannot be excluded. Osteomyelitis cannot be excluded.  Today, 01/04/2020: - Tmax 99, wbc elevated but down - CKD 5--> not on HD, scr trending up 6.87 (crcl~12)   Plan: - ancef 1gm IV q12h - monitor renal function closely and adjust dose if/when appropriate ______________________________________  Height: 5\' 10"  (177.8 cm) Weight: 88.6 kg (195 lb 5.2 oz) IBW/kg (Calculated) : 68.5  Temp (24hrs), Avg:97.9 F (36.6 C), Min:97.7 F (36.5 C), Max:98.2 F (36.8 C)  Recent Labs  Lab 12/31/19 0518 01/01/20 0612 01/02/20 0844 01/03/20 0834 01/04/20 0521  WBC 23.6* 16.0* 17.6* 28.3* 18.6*  CREATININE 5.68* 5.98* 6.17* 6.22* 6.87*    Estimated Creatinine Clearance: 11.6 mL/min (A) (by C-G formula based on SCr of 6.87 mg/dL (H)).    Allergies  Allergen Reactions  . Lidocaine Itching  . Trazodone Swelling  . Latex Rash     Thank you for allowing pharmacy to be a part of this patient's care.  Lynelle Doctor 01/04/2020 4:01 PM

## 2020-01-04 NOTE — Progress Notes (Signed)
Occupational Therapy Treatment Patient Details Name: [name] MRN: [id number] DOB: [birth year] Today's Date: 01/04/2020    History of present illness 54 year old female with DM, HTN, CKD 5, status post left foot Chopart amputation 2014 managed by podiatry, left SFA vascularization in 2020, prior CVA who came into the hospital after a ground-level fall resulting in left hip fracture,   S/P Intramedullary fixation, Left femur on 10/28.   OT comments  Treatment focused on improving patient's bed mobility and transfers in order to prepare for advanced ADL training. Patient continues to be limited by pain in left hip and limited weight bearing in left foot. Patient exhibited improved ability to transfer to side of bed using bed rail and min assist from therapist. Min assist from high bed position to stand with walker and pivot to wc. +2 for safety and equipment management. Required encouragement and education in regards to therapy and recovery during treatment. Therapist recommends short term rehab at discharge.    Follow Up Recommendations  SNF    Equipment Recommendations  Wheelchair (measurements OT);3 in 1 bedside commode;Tub/shower bench    Recommendations for Other Services      Precautions / Restrictions Precautions Precautions: Fall Precaution Comments: has been limited WB on L foot plantar due to wound, using Knee walker PTA. Restrictions Weight Bearing Restrictions: Yes LLE Weight Bearing: Weight bearing as tolerated Other Position/Activity Restrictions: WBAT on Left hip but has been essentially NWB on L plantar PTA       Mobility Bed Mobility Overal bed mobility: Needs Assistance Bed Mobility: Supine to Sit     Supine to sit: Min assist     General bed mobility comments: Improved transfer today. Min assist for LLE to transfer to side of bed. Patient needed increased time and use of bed rail.  Transfers Overall transfer level: Needs assistance Equipment used:  Rolling walker (2 wheeled);None Transfers: Sit to/from [facility] Sit to Stand: Min assist;From elevated surface;+2 safety/equipment Stand pivot transfers: Min assist       General transfer comment: Min assist to stand from elevated bed surface with RW. Verbal cues for technqiue. Min assist to stand pivot to wc. Limited weight bearin tolerane on Left foot.+2 for safety and equipment.    Balance Overall balance assessment: Mild deficits observed, not formally tested                                         ADL either performed or assessed with clinical judgement   ADL       Grooming: Set up;Sitting;Wash/dry face Grooming Details (indicate cue type and reason): Washed face seated at side of bed with back unsupported.                                     Vision Patient Visual Report: No change from baseline     Perception     Praxis      Cognition Arousal/Alertness: Awake/alert Behavior During Therapy: WFL for tasks assessed/performed;Flat affect Overall Cognitive Status: Within Functional Limits for tasks assessed                                          Exercises     Shoulder  Instructions       General Comments      Pertinent Vitals/ Pain       Pain Assessment: Faces Faces Pain Scale: Hurts little more Pain Location: Left Hip Pain Descriptors / Indicators: Guarding;Grimacing;Sore Pain Intervention(s): Premedicated before session;Monitored during session  Home Living                                          Prior Functioning/Environment              Frequency  Min 2X/week        Progress Toward Goals  OT Goals(current goals can now be found in the care plan section)  Progress towards OT goals: Progressing toward goals  Acute Rehab OT Goals Patient Stated Goal: to go home OT Goal Formulation: With patient Time For Goal Achievement: 01/13/20 Potential to Achieve  Goals: Good  Plan Discharge plan needs to be updated    Co-evaluation    PT/OT/SLP Co-Evaluation/Treatment: Yes Reason for Co-Treatment: For patient/therapist safety;To address functional/ADL transfers   OT goals addressed during session: Other (comment) (functional mobility in preparation for ADLs)      AM-PAC OT "6 Clicks" Daily Activity     Outcome Measure   Help from another person eating meals?: None Help from another person taking care of personal grooming?: A Little Help from another person toileting, which includes using toliet, bedpan, or urinal?: Total Help from another person bathing (including washing, rinsing, drying)?: A Lot Help from another person to put on and taking off regular upper body clothing?: A Little Help from another person to put on and taking off regular lower body clothing?: A Lot 6 Click Score: 15    End of Session Equipment Utilized During Treatment: Gait belt;Rolling walker  OT Visit Diagnosis: Pain;Other abnormalities of gait and mobility (R26.89);Unsteadiness on feet (R26.81);History of falling (Z91.81);Muscle weakness (generalized) (M62.81) Pain - Right/Left: Left Pain - part of body: Hip   Activity Tolerance Patient limited by pain   Patient Left in chair;with call bell/phone within reach   Nurse Communication Mobility status        Time: 7867-6720 OT Time Calculation (min): 26 min  Charges: OT General Charges $OT Visit: 1 Visit OT Treatments $Therapeutic Activity: 8-22 mins  Derl Barrow, OTR/L [place]  Office [phone] Pager: [place] 01/04/2020, 12:21 PM

## 2020-01-05 DIAGNOSIS — M86672 Other chronic osteomyelitis, left ankle and foot: Secondary | ICD-10-CM | POA: Diagnosis not present

## 2020-01-05 DIAGNOSIS — E118 Type 2 diabetes mellitus with unspecified complications: Secondary | ICD-10-CM | POA: Diagnosis not present

## 2020-01-05 DIAGNOSIS — I120 Hypertensive chronic kidney disease with stage 5 chronic kidney disease or end stage renal disease: Secondary | ICD-10-CM | POA: Diagnosis not present

## 2020-01-05 DIAGNOSIS — D649 Anemia, unspecified: Secondary | ICD-10-CM | POA: Diagnosis not present

## 2020-01-05 DIAGNOSIS — N185 Chronic kidney disease, stage 5: Secondary | ICD-10-CM | POA: Diagnosis not present

## 2020-01-05 DIAGNOSIS — E1129 Type 2 diabetes mellitus with other diabetic kidney complication: Secondary | ICD-10-CM | POA: Diagnosis not present

## 2020-01-05 DIAGNOSIS — E8770 Fluid overload, unspecified: Secondary | ICD-10-CM | POA: Diagnosis not present

## 2020-01-05 DIAGNOSIS — N179 Acute kidney failure, unspecified: Secondary | ICD-10-CM | POA: Diagnosis not present

## 2020-01-05 DIAGNOSIS — E1165 Type 2 diabetes mellitus with hyperglycemia: Secondary | ICD-10-CM | POA: Diagnosis not present

## 2020-01-05 DIAGNOSIS — T148XXA Other injury of unspecified body region, initial encounter: Secondary | ICD-10-CM | POA: Diagnosis not present

## 2020-01-05 DIAGNOSIS — L97524 Non-pressure chronic ulcer of other part of left foot with necrosis of bone: Secondary | ICD-10-CM | POA: Diagnosis not present

## 2020-01-05 LAB — CBC WITH DIFFERENTIAL/PLATELET
Abs Immature Granulocytes: 1.03 10*3/uL — ABNORMAL HIGH (ref 0.00–0.07)
Basophils Absolute: 0.1 10*3/uL (ref 0.0–0.1)
Basophils Relative: 0 %
Eosinophils Absolute: 0 10*3/uL (ref 0.0–0.5)
Eosinophils Relative: 0 %
HCT: 23.8 % — ABNORMAL LOW (ref 36.0–46.0)
Hemoglobin: 7.6 g/dL — ABNORMAL LOW (ref 12.0–15.0)
Immature Granulocytes: 3 %
Lymphocytes Relative: 3 %
Lymphs Abs: 1.1 10*3/uL (ref 0.7–4.0)
MCH: 28.3 pg (ref 26.0–34.0)
MCHC: 31.9 g/dL (ref 30.0–36.0)
MCV: 88.5 fL (ref 80.0–100.0)
Monocytes Absolute: 1.9 10*3/uL — ABNORMAL HIGH (ref 0.1–1.0)
Monocytes Relative: 6 %
Neutro Abs: 30.5 10*3/uL — ABNORMAL HIGH (ref 1.7–7.7)
Neutrophils Relative %: 88 %
Platelets: 241 10*3/uL (ref 150–400)
RBC: 2.69 MIL/uL — ABNORMAL LOW (ref 3.87–5.11)
RDW: 15.2 % (ref 11.5–15.5)
WBC: 34.7 10*3/uL — ABNORMAL HIGH (ref 4.0–10.5)
nRBC: 0 % (ref 0.0–0.2)

## 2020-01-05 LAB — COMPREHENSIVE METABOLIC PANEL
ALT: 12 U/L (ref 0–44)
AST: 22 U/L (ref 15–41)
Albumin: 2.1 g/dL — ABNORMAL LOW (ref 3.5–5.0)
Alkaline Phosphatase: 216 U/L — ABNORMAL HIGH (ref 38–126)
Anion gap: 13 (ref 5–15)
BUN: 119 mg/dL — ABNORMAL HIGH (ref 6–20)
CO2: 16 mmol/L — ABNORMAL LOW (ref 22–32)
Calcium: 8 mg/dL — ABNORMAL LOW (ref 8.9–10.3)
Chloride: 96 mmol/L — ABNORMAL LOW (ref 98–111)
Creatinine, Ser: 6.76 mg/dL — ABNORMAL HIGH (ref 0.44–1.00)
GFR, Estimated: 7 mL/min — ABNORMAL LOW (ref >60)
Glucose, Bld: 352 mg/dL — ABNORMAL HIGH (ref 70–99)
Potassium: 4.7 mmol/L (ref 3.5–5.1)
Sodium: 125 mmol/L — ABNORMAL LOW (ref 135–145)
Total Bilirubin: 1 mg/dL (ref 0.3–1.2)
Total Protein: 6.2 g/dL — ABNORMAL LOW (ref 6.5–8.1)

## 2020-01-05 LAB — PHOSPHORUS: Phosphorus: 6 mg/dL — ABNORMAL HIGH (ref 2.5–4.6)

## 2020-01-05 LAB — C-REACTIVE PROTEIN: CRP: 20.1 mg/dL — ABNORMAL HIGH (ref <1.0)

## 2020-01-05 LAB — GLUCOSE, CAPILLARY
Glucose-Capillary: 314 mg/dL — ABNORMAL HIGH (ref 70–99)
Glucose-Capillary: 254 mg/dL — ABNORMAL HIGH (ref 70–99)
Glucose-Capillary: 206 mg/dL — ABNORMAL HIGH (ref 70–99)
Glucose-Capillary: 192 mg/dL — ABNORMAL HIGH (ref 70–99)

## 2020-01-05 LAB — SEDIMENTATION RATE: Sed Rate: 74 mm/hr — ABNORMAL HIGH (ref 0–22)

## 2020-01-05 LAB — MAGNESIUM: Magnesium: 1.5 mg/dL — ABNORMAL LOW (ref 1.7–2.4)

## 2020-01-05 MED ORDER — MAGNESIUM SULFATE IN D5W 1-5 GM/100ML-% IV SOLN
1.0000 g | Freq: Once | INTRAVENOUS | Status: AC
  Administered 2020-01-05: 1 g via INTRAVENOUS
  Filled 2020-01-05: qty 100

## 2020-01-05 MED ORDER — INSULIN GLARGINE 100 UNIT/ML ~~LOC~~ SOLN
24.0000 [IU] | Freq: Every day | SUBCUTANEOUS | Status: DC
  Administered 2020-01-05 – 2020-01-07 (×3): 24 [IU] via SUBCUTANEOUS
  Filled 2020-01-05 (×4): qty 0.24

## 2020-01-05 MED ORDER — ALUM & MAG HYDROXIDE-SIMETH 200-200-20 MG/5ML PO SUSP: 30.0000 mL | ORAL | Status: DC | PRN

## 2020-01-05 MED ORDER — INSULIN ASPART 100 UNIT/ML ~~LOC~~ SOLN
0.0000 [IU] | Freq: Every day | SUBCUTANEOUS | Status: DC
  Administered 2020-01-07 – 2020-01-08 (×2): 2 [IU] via SUBCUTANEOUS
  Administered 2020-01-09: 4 [IU] via SUBCUTANEOUS
  Administered 2020-01-10: 3 [IU] via SUBCUTANEOUS
  Administered 2020-01-11: 2 [IU] via SUBCUTANEOUS
  Administered 2020-01-12: 4 [IU] via SUBCUTANEOUS
  Administered 2020-01-13 – 2020-01-15 (×2): 2 [IU] via SUBCUTANEOUS
  Administered 2020-01-17 – 2020-01-19 (×2): 3 [IU] via SUBCUTANEOUS
  Administered 2020-01-20: 2 [IU] via SUBCUTANEOUS
  Administered 2020-01-21: 3 [IU] via SUBCUTANEOUS

## 2020-01-05 MED ORDER — CEFAZOLIN SODIUM-DEXTROSE 2-4 GM/100ML-% IV SOLN
2.0000 g | Freq: Two times a day (BID) | INTRAVENOUS | Status: DC
  Administered 2020-01-06 – 2020-01-10 (×9): 2 g via INTRAVENOUS
  Filled 2020-01-05 (×10): qty 100

## 2020-01-05 MED ORDER — CEFAZOLIN SODIUM-DEXTROSE 2-4 GM/100ML-% IV SOLN
2.0000 g | Freq: Three times a day (TID) | INTRAVENOUS | Status: DC
  Filled 2020-01-05: qty 100

## 2020-01-05 MED ORDER — DEXLANSOPRAZOLE 60 MG PO CPDR
60.0000 mg | DELAYED_RELEASE_CAPSULE | Freq: Every day | ORAL | Status: DC
  Administered 2020-01-05 – 2020-01-15 (×9): 60 mg via ORAL
  Filled 2020-01-05 (×15): qty 1

## 2020-01-05 MED ORDER — ALOSETRON HCL 0.5 MG PO TABS
1.0000 mg | ORAL_TABLET | Freq: Two times a day (BID) | ORAL | Status: DC
  Administered 2020-01-05 – 2020-01-06 (×2): 1 mg via ORAL

## 2020-01-05 MED ORDER — SODIUM BICARBONATE 650 MG PO TABS
1300.0000 mg | ORAL_TABLET | Freq: Three times a day (TID) | ORAL | Status: DC
  Administered 2020-01-05 (×2): 1300 mg via ORAL
  Filled 2020-01-05 (×2): qty 2

## 2020-01-05 NOTE — Progress Notes (Addendum)
Patient was questioned about home medications that were to be brought in to be taken while in the hospital.   Patient notified this nurse that there were three bottles at the bedside that she had been taking.   I educated that the medications needed to go to pharmacy to be verified, locked up, and dispensed from there.  Paperwork was filled out and medication was brought to pharmacy.  MD was notified.

## 2020-01-05 NOTE — Progress Notes (Signed)
I encouraged patient to turn @ 2000 and 2200, she refused. She explained that she is more comfortable on her back.

## 2020-01-05 NOTE — Progress Notes (Signed)
PHARMACY NOTE:  ANTIMICROBIAL RENAL DOSAGE ADJUSTMENT  Current antimicrobial regimen includes a mismatch between antimicrobial dosage and estimated renal function.  As per policy approved by the Pharmacy & Therapeutics and Medical Executive Committees, the antimicrobial dosage will be adjusted accordingly.  Current antimicrobial dosage:  Cefazolin 1 gm q 12 hours  Indication: Osteo/wound infection  Renal Function:  Estimated Creatinine Clearance: 12.2 mL/min (A) (by C-G formula based on SCr of 6.76 mg/dL (H)). []      On intermittent HD, scheduled: []      On CRRT    Antimicrobial dosage has been changed to:  Cefazolin 2 gm q 12 hours  Additional comments:   Thank you for allowing pharmacy to be a part of this patient's care.  Jimmy Footman, PharmD, BCPS, BCIDP Infectious Diseases Clinical Pharmacist Phone: [phone] 01/05/2020 5:10 PM

## 2020-01-05 NOTE — Progress Notes (Signed)
Inpatient Diabetes Program Recommendations  AACE/ADA: New Consensus Statement on Inpatient Glycemic Control (2015)  Target Ranges:  Prepandial:   less than 140 mg/dL      Peak postprandial:   less than 180 mg/dL (1-2 hours)      Critically ill patients:  140 - 180 mg/dL   Lab Results  Component Value Date   GLUCAP 314 (H) 01/05/2020   HGBA1C 6.2 (H) 12/29/2019    Review of Glycemic Control  Diabetes history: DM1 Outpatient Diabetes medications: Lantus 20 units QD, Novolog 10 units tidwc Current orders for Inpatient glycemic control: Lantus 20 units QHS, Novolog 0-9 units tidwc + 3 units tidwc  HgbA1C - 6.2% CBGs 352, 314 mg/dL this am.  Inpatient Diabetes Program Recommendations:     Increase Lantus to 24 units QHS Add HS correction Add Novolog 5 units tidwc for meal coverage insulin if eating > 50% meal  Continue to follow glucose trends.  Thank you. Lorenda Peck, RD, LDN, CDE Inpatient Diabetes Coordinator [phone]

## 2020-01-05 NOTE — Consult Note (Addendum)
ASSESSMENT & PLAN:  54 y.o. female with:  1) CKD4 nearing ESRD. Needs permanent HD access. Vein mapping from a month ago shows adequate vein in left (non-dominate) arm. Tentatively plan OR for AVF/G +/- [facility] Monday at [facility] pending vein mapping.  2) diabetic foot ulceration. She last had an angiogram 12/17/18 in our system. It may be worth repeating angiography for limb salvage. Will discuss with primary team in AM. Check ABI and arterial ultrasound of left lower extremity  Will need transfer to [facility] for the above.  CHIEF COMPLAINT:   Foot ulceration  HISTORY:  HISTORY OF PRESENT ILLNESS: [name] is a 54 y.o. female admitted to the internal medicine service for treatment after a fall causing femoral fracture. 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  She underwent atherectomy and angioplasty of her left superficial femoral artery in 2020 with IR.  Unfortunately her renal function has deteriorated and she needs durable dialysis access.  Our service has been consulted to evaluate for the same.  She is seeing Dr. Carlis Abbott about a month ago but declined access at that time because of social stressors.  She is right-handed.  She had vein mapping completed a month ago which shows adequate left cephalic and basilic vein for autogenous access.  She denies any history of port or catheter in the jugular veins.  Past Medical History:  Diagnosis Date  . Anxiety 2002  . Chest tightness   . Depression 2001  . Diabetes type 1, uncontrolled ([place])    at age 74  . Diabetic neuropathy ([place])   . Essential hypertension 2015  . GERD (gastroesophageal reflux disease)    about age of 31  . Nausea and vomiting in adult   . Stroke ([place])   . Urinary frequency   . Yeast vaginitis     Past Surgical History:  Procedure Laterality Date  . ACHILLES TENDON SURGERY Left 03/10/2013   Procedure: LEFT CHOPART AMPUTATION/ LEFT  TENDON ACHILLES [place];  Surgeon: Wylene Simmer, MD;  Location: [place];  Service: Orthopedics;  Laterality: Left;  . AMPUTATION Left 06/15/2012   Procedure: AMPUTATION DIGIT;  Surgeon: Meredith Pel, MD;  Location: [place];  Service: Orthopedics;  Laterality: Left;  Left great toe revision amputation  . ENDOMETRIAL ABLATION    . INTRAMEDULLARY (IM) NAIL INTERTROCHANTERIC Left 12/29/2019   Procedure: INTRAMEDULLARY (IM) NAIL INTERTROCHANTRIC;  Surgeon: Rod Can, MD;  Location[facility];  Service: Orthopedics;  Laterality: Left;  . IR ANGIOGRAM EXTREMITY LEFT  12/17/2018  . IR FEM POP ART ATHERECT INC PTA MOD SED  12/17/2018  . IR RADIOLOGIST EVAL & MGMT  11/30/2018  . IR RADIOLOGIST EVAL & MGMT  12/09/2018  . IR RADIOLOGIST EVAL & MGMT  02/08/2019  . IR US GUIDE VASC ACCESS RIGHT  12/17/2018  . LAPAROSCOPIC CHOLECYSTECTOMY  01/30/2015   [facility] day surgery     Family History  Problem Relation Age of Onset  . Diabetes Mother   . Heart attack Mother   . Heart disease Mother        before age 24  . Hypertension Mother   . Hyperlipidemia Mother   . Diabetes Father   . Heart attack Father   . Heart disease Father        before age 39  . Diabetes Sister   . Hypertension Sister     Social History   Socioeconomic History  .  Marital status: Divorced    Spouse name: Not on file  . Number of children: 1  . Years of education: Not on file  . Highest education level: Not on file  Occupational History    Employer: [employer]  Tobacco Use  . Smoking status: Light Tobacco Smoker    Packs/day: 0.25    Years: 22.00    Pack years: 5.50    Types: Cigarettes  . Smokeless tobacco: Never Used  Vaping Use  . Vaping Use: Never used  Substance and Sexual Activity  . Alcohol use: Yes    Alcohol/week: 0.0 standard drinks    Comment: sociial  . Drug use: No  . Sexual activity: Yes    Partners: Male  Other Topics Concern  . Not on file  Social History Narrative   Lives at home with her  daughter   Right handed   Caffeine: 3 cups daily   Social Determinants of Health   Financial Resource Strain:   . Difficulty of Paying Living Expenses: Not on file  Food Insecurity:   . Worried About Charity fundraiser in the Last Year: Not on file  . Ran Out of Food in the Last Year: Not on file  Transportation Needs:   . Lack of Transportation (Medical): Not on file  . Lack of Transportation (Non-Medical): Not on file  Physical Activity:   . Days of Exercise per Week: Not on file  . Minutes of Exercise per Session: Not on file  Stress:   . Feeling of Stress : Not on file  Social Connections:   . Frequency of Communication with Friends and Family: Not on file  . Frequency of Social Gatherings with Friends and Family: Not on file  . Attends Religious Services: Not on file  . Active Member of Clubs or Organizations: Not on file  . Attends Archivist Meetings: Not on file  . Marital Status: Not on file  Intimate Partner Violence:   . Fear of Current or Ex-Partner: Not on file  . Emotionally Abused: Not on file  . Physically Abused: Not on file  . Sexually Abused: Not on file    Allergies  Allergen Reactions  . Lidocaine Itching  . Trazodone Swelling  . Latex Rash    Current Facility-Administered Medications  Medication Dose Route Frequency Provider Last Rate Last Admin  . (feeding supplement) PROSource Plus liquid 30 mL  30 mL Oral TID BM Rod Can, MD   30 mL at 01/05/20 1438  . 0.9 %  sodium chloride infusion   Intravenous PRN Wynonia Musty, RN 10 mL/hr at 12/29/19 2300 Rate Verify at 12/29/19 2300  . alosetron (LOTRONEX) tablet 1 mg  1 mg Oral BID Raiford Noble [place], DO   1 mg at 01/05/20 2131  . alum & mag hydroxide-simeth (MAALOX/MYLANTA) 200-200-20 MG/5ML suspension 30 mL  30 mL Oral Q4H PRN Wofford, Drew A, RPH      . amLODipine (NORVASC) tablet 10 mg  10 mg Oral Daily Caren Griffins, MD   10 mg at 01/05/20 1122  . atorvastatin (LIPITOR)  tablet 40 mg  40 mg Oral q1800 Rod Can, MD   40 mg at 01/05/20 1708  . calcium carbonate (TUMS - dosed in mg elemental calcium) chewable tablet 200 mg of elemental calcium  1 tablet Oral TID PRN Lang Snow, FNP   200 mg of elemental calcium at 12/30/19 0834  . carvedilol (COREG) tablet 25 mg  25 mg Oral BID WC  Caren Griffins, MD   25 mg at 01/05/20 1620  . [START ON 01/06/2020] ceFAZolin (ANCEF) IVPB 2g/100 mL premix  2 g Intravenous Q12H Susa Raring, RPH      . Chlorhexidine Gluconate Cloth 2 % PADS 6 each  6 each Topical Daily Caren Griffins, MD   6 each at 01/05/20 1122  . collagenase (SANTYL) ointment   Topical Daily Evelina Bucy, DPM   Given at 01/05/20 1123  . cyclobenzaprine (FLEXERIL) tablet 10 mg  10 mg Oral BID PRN Rod Can, MD   10 mg at 01/05/20 1702  . dexlansoprazole (DEXILANT) capsule 60 mg  60 mg Oral Daily Raiford Noble [place], DO   60 mg at 01/05/20 1300  . docusate sodium (COLACE) capsule 100 mg  100 mg Oral BID Rod Can, MD   100 mg at 01/01/20 2204  . DULoxetine (CYMBALTA) DR capsule 60 mg  60 mg Oral Daily Rod Can, MD   60 mg at 01/05/20 1121  . guaiFENesin-dextromethorphan (ROBITUSSIN DM) 100-10 MG/5ML syrup 5 mL  5 mL Oral Q4H PRN Lang Snow, FNP      . heparin injection 5,000 Units  5,000 Units Subcutaneous [address] [place], [place]   5,000 Units at 01/05/20 2130  . hydrALAZINE (APRESOLINE) tablet 10 mg  10 mg Oral Q6H PRN Caren Griffins, MD   10 mg at 12/31/19 0454  . hydrALAZINE (APRESOLINE) tablet 100 mg  100 mg Oral TID Caren Griffins, MD   100 mg at 01/05/20 2135  . HYDROcodone-acetaminophen (NORCO/VICODIN) 5-325 MG per tablet 1-2 tablet  1-2 tablet Oral Q6H PRN Rod Can, MD   2 tablet at 01/05/20 2140  . insulin aspart (novoLOG) injection 0-5 Units  0-5 Units Subcutaneous QHS Sheikh, Omair Latif, DO      . insulin aspart (novoLOG) injection 0-9 Units  0-9 Units Subcutaneous TID WC Rod Can, MD   3  Units at 01/05/20 1707  . insulin aspart (novoLOG) injection 3 Units  3 Units Subcutaneous TID WC Caren Griffins, MD   3 Units at 01/05/20 1707  . insulin glargine (LANTUS) injection 24 Units  24 Units Subcutaneous QHS Raiford Noble [place], [place]   24 Units at 01/05/20 2132  . lip balm (CARMEX) ointment 1 application  1 application Topical PRN Lang Snow, FNP      . loratadine (CLARITIN) tablet 10 mg  10 mg Oral Daily PRN Lang Snow, FNP      . menthol-cetylpyridinium (CEPACOL) lozenge 3 mg  1 lozenge Oral PRN Swinteck, Aaron Edelman, MD       Or  . phenol (CHLORASEPTIC) mouth spray 1 spray  1 spray Mouth/Throat PRN Swinteck, Aaron Edelman, MD      . metoCLOPramide (REGLAN) tablet 5-10 mg  5-10 mg Oral Q8H PRN Swinteck, Aaron Edelman, MD       Or  . metoCLOPramide (REGLAN) injection 5-10 mg  5-10 mg Intravenous Q8H PRN Swinteck, Aaron Edelman, MD      . morphine 2 MG/ML injection 0.5 mg  0.5 mg Intravenous Q2H PRN Rod Can, MD   0.5 mg at 01/05/20 2005  . multivitamin (RENA-VIT) tablet 1 tablet  1 tablet Oral QHS Rod Can, MD   1 tablet at 01/05/20 2131  . Muscle Rub CREA 1 application  1 application Topical PRN Lang Snow, FNP      . ondansetron [facility]) tablet 4 mg  4 mg Oral Q6H PRN Rod Can, MD       Or  .  ondansetron (ZOFRAN) injection 4 mg  4 mg Intravenous Q6H PRN Swinteck, Aaron Edelman, MD      . polyvinyl alcohol (LIQUIFILM TEARS) 1.4 % ophthalmic solution 1 drop  1 drop Both Eyes PRN Lang Snow, FNP      . senna-docusate (Senokot-S) tablet 1 tablet  1 tablet Oral QHS PRN Swinteck, Aaron Edelman, MD      . sodium bicarbonate 150 mEq in dextrose 5 % 1,000 mL infusion   Intravenous Continuous Minda Ditto, RPH 75 mL/hr at 01/05/20 1622 New Bag at 01/05/20 1622  . sodium bicarbonate tablet 1,300 mg  1,300 mg Oral TID Roney Jaffe, MD   1,300 mg at 01/05/20 2136  . sodium chloride (OCEAN) 0.65 % nasal spray 1 spray  1 spray Each Nare PRN Lang Snow, FNP        REVIEW OF SYSTEMS:  [X]   denotes positive finding, [ ]  denotes negative finding Cardiac  Comments:  Chest pain or chest pressure:    Shortness of breath upon exertion:    Short of breath when lying flat:    Irregular heart rhythm:        Vascular    Pain in calf, thigh, or hip brought on by ambulation:    Pain in feet at night that wakes you up from your sleep:     Blood clot in your veins:    Leg swelling:         Pulmonary    Oxygen at home:    Productive cough:     Wheezing:         Neurologic    Sudden weakness in arms or legs:     Sudden numbness in arms or legs:     Sudden onset of difficulty speaking or slurred speech:    Temporary loss of vision in one eye:     Problems with dizziness:         Gastrointestinal    Blood in stool:     Vomited blood:         Genitourinary    Burning when urinating:     Blood in urine:        Psychiatric    Major depression:         Hematologic    Bleeding problems:    Problems with blood clotting too easily:        Skin    Rashes or ulcers:        Constitutional    Fever or chills:     PHYSICAL EXAM:   Vitals:   01/05/20 1124 01/05/20 1250 01/05/20 1452 01/05/20 2130  BP:  (!) 122/59  (!) 106/47  Pulse:  62 68 72  Resp:  18  20  Temp:  97.6 F (36.4 C)  98.8 F (37.1 C)  TempSrc:  Oral  Oral  SpO2:  98%  93%  Weight: 95 kg     Height:        Constitutional: Well appearing in no distress. Appears well nourished.  Neurologic: CN intact.  No weakness.  No sensory loss. Psychiatric: Mood and affect symmetric and appropriate. Eyes: No icterus.  No conjunctival pallor. Ears, nose, throat: mucous membranes moist.  Midline trachea.  Cardiac: regular rate and rhythm.  Respiratory: Unlabored Abdominal: soft, non-tender, non-distended.  Peripheral vascular: Left radial and brachial pulses 2+ No ballotable veins in the left upper extremity Plantar ulceration in setting of Chopart amputation (see photo) No palpable pedal pulses Extremity: No  edema.  No cyanosis.  No pallor.  Skin: No gangrene. (+) ulceration.  Lymphatic: No Stemmer's sign.  No palpable lymphadenopathy.         DATA REVIEW:    Most recent CBC CBC Latest Ref Rng & Units 01/05/2020 01/04/2020 01/03/2020  WBC 4.0 - 10.5 K/uL 34.7(H) 18.6(H) 28.3(H)  Hemoglobin 12.0 - 15.0 g/dL 7.6(L) 8.0(L) 7.2(L)  Hematocrit 36 - 46 % 23.8(L) 26.0(L) 23.4(L)  Platelets 150 - 400 K/uL 241 275 294     Most recent CMP CMP Latest Ref Rng & Units 01/05/2020 01/04/2020 01/03/2020  Glucose 70 - 99 mg/dL 352(H) 251(H) 158(H)  BUN 6 - 20 mg/dL 119(H) 111(H) 103(H)  Creatinine 0.44 - 1.00 mg/dL 6.76(H) 6.87(H) 6.22(H)  Sodium 135 - 145 mmol/L 125(L) 129(L) 128(L)  Potassium 3.5 - 5.1 mmol/L 4.7 4.8 4.9  Chloride 98 - 111 mmol/L 96(L) 104 104  CO2 22 - 32 mmol/L 16(L) 14(L) 14(L)  Calcium 8.9 - 10.3 mg/dL 8.0(L) 8.1(L) 8.0(L)  Total Protein 6.5 - 8.1 g/dL 6.2(L) - -  Total Bilirubin 0.3 - 1.2 mg/dL 1.0 - -  Alkaline Phos 38 - 126 U/L 216(H) - -  AST 15 - 41 U/L 22 - -  ALT 0 - 44 U/L 12 - -    Renal function Estimated Creatinine Clearance: 12.2 mL/min (A) (by C-G formula based on SCr of 6.76 mg/dL (H)).  HbA1c, POC (controlled diabetic range) (%)  Date Value  05/10/2018 7.6 (A)   Hgb A1c MFr Bld (%)  Date Value  12/29/2019 6.2 (H)    LDL Calculated  Date Value Ref Range Status  02/16/2017 Comment 0 - 99 mg/dL Final    Comment:    Triglyceride result indicated is too high for an accurate LDL cholesterol estimation.    LDL Cholesterol  Date Value Ref Range Status  04/08/2017 101 (H) 0 - 99 mg/dL Final    Comment:           Total Cholesterol/HDL:CHD Risk Coronary Heart Disease Risk Table                     Men   Women  1/2 Average Risk   3.4   3.3  Average Risk       5.0   4.4  2 X Average Risk   9.6   7.1  3 X Average Risk  23.4   11.0        Use the calculated Patient Ratio above and the CHD Risk Table to determine the patient's CHD Risk.        ATP  III CLASSIFICATION (LDL):  <100     mg/dL   Optimal  100-129  mg/dL   Near or Above                    Optimal  130-159  mg/dL   Borderline  160-189  mg/dL   High  >190     mg/dL   Very High Performed at [facility] [address] [place], [place] [address]    Direct LDL  Date Value Ref Range Status  03/29/2014 101.0 mg/dL Final    Comment:    Optimal:  <100 mg/dLNear or Above Optimal:  100-129 mg/dLBorderline High:  130-159 mg/dLHigh:  160-189 mg/dLVery High:  >190 mg/dL    Yevonne Aline. Stanford Breed, MD [facility] Phone Number: [phone] 01/05/2020 11:12 PM

## 2020-01-05 NOTE — TOC Progression Note (Signed)
Transition of Care [facility]) - Progression Note    Patient Details  Name: [name] MRN: [id number] Date of Birth: 04-04-1965  Transition of Care [facility]) CM/SW Contact  Analya Louissaint, Juliann Pulse, RN Phone Number: 01/05/2020, 10:20 AM  Clinical Narrative: Bed offers given. Await pasrr.    1. 1.7 mi [place] at [place], [place] [address] [phone] Overall rating Average 2. 2 mi [facility] at the [place], [place] [address] [phone] Overall rating Below average 3. 2.1 mi  [place] at [place], [place] [address] [phone] Overall rating Much below average 4. 2.2 mi [place] [facility] [employer] [place], [place] [address] [phone] Overall rating Much above average 5. 2.2 mi [place], [place] [address] [phone] Overall rating Much below average 6. 2.7 [place], [place] [address] [phone] Overall rating Much above average 7. 3.2 mi [place] [address] [place], [place] [address] [phone] Overall rating Average 8. 3.3 mi [facility] [address] [place], [place] [address] [phone] Overall rating Much below average 9. 3.5 mi [facility] [place], [place] [address] [phone] Overall rating Below average 10. 4.3 [place], [place] [address] [phone] Overall rating Below average 11. 4.7 mi [facility] at [place], [place] [address] [phone] Overall rating Much above average 12. 5.2 mi [facility] [address] [place], [place]  [address] [phone] Overall rating Much above average 13. 6.2 mi [place], [place] [address] [phone] Overall rating Average 14. 8.1 [place], [place] [address] [phone] Overall rating Above average 15. 8.1 mi [facility] and [facility] [place], [place] [address] [phone] Overall rating Much below average 16. 9.7 mi [facility] [address] [place], [place] [address] [phone] Overall rating Average 17. 10 mi [facility] [address] [place], [place] [address] [phone] Overall rating Much above average 18. 11.4 mi [place] at [facility] [address] [place], [place] [address] [phone] Overall rating Much above average 19. 13.5 [facility] [address] [place], [place] [address] [phone] Overall rating Much below average 20. 13.5 mi [facility] [address] [place], [place] [address] [phone] Overall rating Much below average 21. 14.5 mi [place] [address] [place], [place] [address] [phone] Overall rating Much below average 22. 14.9 mi The [place] [facility] [address] [place], [place] [address] [phone] Overall rating Much below average 23. 15.1 mi [facility] at [place], [place] [address] [phone] Overall rating Much below average 24. 15.3 mi [facility] [place], [place] [address] [phone] Overall rating Below average 25. [place], [place] [address] [phone] Overall rating Much above average 26. 16.2 [place],  [address] [phone] Overall rating Much above average 27. 16.6  mi [place] [address] [place], [place] [address] [phone] Overall rating Below average 28. 17.2 mi [place], [place] [address] [phone] Overall rating Average 29. 68.6 [facility] [address] [place], [place] [address] [phone] Overall rating Much below average 30. 19.4 mi [address] at [employer] at [facility] [place], [place] [address] [phone] Overall rating Much above average 31. 20.6 mi [place] [address] [place], [place] [address] [phone] Overall rating Much below average 32. 20.7 [place] and [facility] [place], [place] [address] [phone] Overall rating Below average 33. 88.7 [facility] [address] [place], [place] [address] [phone] Overall rating Below average 34. 21.1 [place], [place] [address] [phone] Overall rating Much above average 35. [place] [address] [place], [place] [address] [phone] Overall rating Below average 36. 22.4 mi [address] [place], [place] [address] [phone] Overall rating Average 37. 22.7 mi [facility] [address] [place], [place] [address] [phone] Overall rating Above average 38. 22.8 [address] [place], [place] [address] [phone] Overall rating Much above average 39. 22.9 [place], [place] [address] [phone] Overall rating Much below average 40. [place], [place] [address] [phone] Overall rating Much below  average 41. 24.4 mi [facility] and [facility] [address] [place], [place] [address] [phone] Overall rating Below average 42. 24.6 [facility] [address] [place], [place] [address] [phone] Overall rating Much below average 43. 24.6 mi [place], [place] [address] [phone] Overall rating Below average 44. 24.9 mi [facility] [address] [place],  [address] [phone]  Expected Discharge Plan: [place] Barriers to Discharge: Continued Medical Work up  Expected Discharge Plan and Services Expected Discharge Plan: [place]   Discharge Planning Services: CM Consult Post Acute Care Choice: IP Rehab Living arrangements for the past 2 months: Single Family Home                                       Social Determinants of Health (SDOH) Interventions    Readmission Risk Interventions No flowsheet data found.

## 2020-01-05 NOTE — Progress Notes (Signed)
Nutrition Follow-up  RD working remotely.  DOCUMENTATION CODES:   Not applicable  INTERVENTION:  - continue 30 ml Prosource Plus TID. - will complete NFPE at follow-up, if feasible. - weigh patient today.   NUTRITION DIAGNOSIS:   Increased nutrient needs related to post-op healing as evidenced by estimated needs. -ongoing  GOAL:   Patient will meet greater than or equal to 90% of their needs -likely unmet   MONITOR:   PO intake, Supplement acceptance, Labs, Weight trends  ASSESSMENT:   54 year old female with medical history of DM, HTN, stage 5 CKD, s/p left foot Chopart amputation (2014, managed by podiatry), left SFA revascularization (12/17/2018), and stroke. She presented to the ED from home via EMS after falling onto her L hip on concrete.  RD is working off campus and patient is currently in [facility]. Diet advanced from NPO to Carb Modified on 10/28 at 1620. Flow sheet documentation indicates she consumed 0% of dinner that night; 100% of breakfast on 10/29, 0% of lunch and dinner on 10/29. No other intakes documented.   She has been accepting Prosource Plus ~50% of the time offered. She has not been weighed since 10/28.  Per notes: - L hip fx s/p surgery on 10/28 - acute on chronic anemia of CKD stage 5 - AKI--Nephrology following - BLE edema and volume overload--improved - patient is from home and likely plan for the time of d/c is for SNF    Labs reviewed; CBG: 314 mg/dl, Na: 125 mmol/l, Cl: 96 mmol/l, BUN: 119 mg/dl, creatinine: 6.76 mg/dl, Ca: 8 mg/dl, Phos: 6 mg/dl, Mg: 1.5 mg/dl, GFR: 7.  Medications reviewed; 100 mg colace BID, sliding scale novolog, 3 units novolog TID, 20 units lantus/day, 1 g IV Mg sulfate x1 run 11/4, 1 tablet rena-vit/day.  IVF; D5-150 mEq sodium bicarb @ 75 ml/hr (306 kcal).      NUTRITION - FOCUSED PHYSICAL EXAM:  unable to complete at this time.   Diet Order:   Diet Order            Diet Carb Modified Fluid  consistency: Thin; Room service appropriate? Yes  Diet effective now                 EDUCATION NEEDS:   No education needs have been identified at this time  Skin:  Skin Assessment: Skin Integrity Issues: Skin Integrity Issues:: Incisions Incisions: L hip (10/28)  Last BM:  11/2  Height:   Ht Readings from Last 1 Encounters:  12/28/19 5\' 10"  (1.778 m)    Weight:   Wt Readings from Last 1 Encounters:  12/29/19 88.6 kg    Ideal Body Weight:     BMI:  Body mass index is 28.03 kg/m.  Estimated Nutritional Needs:   Kcal:  1900-2100 kcal  Protein:  95-110 grams  Fluid:  >/= 1.5 L/day     Jarome Matin, MS, RD, LDN, CNSC Inpatient Clinical Dietitian RD pager # available in [facility]  After hours/weekend pager # available in [facility]

## 2020-01-05 NOTE — Progress Notes (Signed)
Patient refused dressing change on 11/3 evening shift. RN Provided education on importance of compliance and encouragement. No change in patients outlook. States she will do it in the morning. RN attempted to do dressing change this morning and patient again refused saying she will do it later today. Education provided by RN again.  Gwendlyn Deutscher, RN

## 2020-01-05 NOTE — Care Management (Signed)
Transition of Care (TOC) -30 day Note       Patient Details   Name: [name]  SMO:[id number]  Date of Birth:October 28, 1965     Transition of Care [facility]) CM/SW Contact   Name:Hanne Kegg [facility]  Phone Number:[phone]  Date:01/05/2020  Time:10:12a     MUST BE:[id number]     To Whom it May Concern:     Please be advised that the above patient will require a short-term nursing home stay, anticipated 30 days or less rehabilitation and strengthening. The plan is for return home.

## 2020-01-05 NOTE — Progress Notes (Signed)
Noted consult for HD access. Several urgent cases at [name] have delayed my evaluation. Still plan to see her tonight. Will need transfer to [facility] if access is desired urgently. Otherwise can arrange permanent HD access for her in clinic.   [name]. Stanford Breed, MD [facility] Phone Number: [phone] 01/05/2020 8:15 PM

## 2020-01-05 NOTE — Progress Notes (Addendum)
[facility] Progress Note  Subjective: pt seen in room at request of attending, creat not improving low 6's. [name], L TMA wound on IV ancef, consulting ID today for assistacne. Pt denies any n/v, diarrhea, flank pain, SOB/ orthopnea, dry mouth or swelling in the legs.    Vitals:   01/04/20 1652 01/04/20 2113 01/05/20 0601 01/05/20 1124  BP: (!) 135/46 (!) 116/49 (!) 137/52   Pulse:  68 63   Resp:  18 18   Temp:  98.6 F (37 C) 98.1 F (36.7 C)   TempSrc:  Oral Oral   SpO2:  96% 98%   Weight:    95 kg  Height:        Exam: Gen alert, no distress Throat is moist No jvd or bruits Chest clear bilat to bases, no rales RRR no MRG Abd soft ntnd no mass or ascites +bs Ext no leg or UE edema, L foot amp site w/ open wound no odor noted Neuro is alert, Ox 3 , nf    Home meds:  - norvasc 5/ coreg 6.25/ asa / lipitor / hydralazine 50 tid  - insulin lantus 50u/ aspart 10u tid  - oxy IR prn/ cymbalta qd  - dexilant/ flexeril prn/ protonix 40     CXR - no acute disease    Na 134  K 4.7  CO2 16  BUN 67  Cr 6.64  Ca 7.8  Hb 6.3 WBC 17K       Last creat's >>      Aug 2021 - creat 4.9      Dec 23, 2019 - creat 5.2        Assessment/ Plan: 1. AoCKD 5 - b/l creat 4.9- 5.2, eGFR 9- 11 from 8/21- 10/21. Pt f/b Dr Posey Pronto. Creat 6.6 on admit 10/27 here, lowest 5.6 >> now back up mid 6's. IVF"s started yest, creat down slightly. BUN 110 range.  Pt w/o uremic symptoms, no indication for dialysis yet.  Pt aware she may require HD while here.  Will cont IVF's another 1-2 days.  Pt euvolemic on exam.  2. Met acidosis - will start po bicarb 2 tid 3. Vasc access - will consult [facility] for possible permanent access 4. Anemia ckd - Hb 7.6, was 6.3 on admit > got 2u prbc's, peak Hb 9 but now back in the 7-8's range. Not symptomatic. Check fe stores.  5. 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 WBC rising, may be consulting ID today.  6. L  hip fracture - sp ORIF on 10/28 per ortho 7. HTN - BP's are stable / normal on her 3 home meds, no vol excess 8. DM - on insulin, per pmd      Rob Traver 01/05/2020, 12:43 PM   Recent Labs  Lab 01/04/20 0521 01/05/20 0523  K 4.8 4.7  BUN 111* 119*  CREATININE 6.87* 6.76*  CALCIUM 8.1* 8.0*  PHOS  --  6.0*  HGB 8.0* 7.6*   Inpatient medications: . (feeding supplement) PROSource Plus  30 mL Oral TID BM  . alosetron  1 mg Oral BID  . amLODipine  10 mg Oral Daily  . atorvastatin  40 mg Oral q1800  . carvedilol  25 mg Oral BID WC  . Chlorhexidine Gluconate Cloth  6 each Topical Daily  . collagenase   Topical Daily  . dexlansoprazole  60 mg Oral Daily  . docusate sodium  100  mg Oral BID  . DULoxetine  60 mg Oral Daily  . heparin injection (subcutaneous)  5,000 Units Subcutaneous Q8H  . hydrALAZINE  100 mg Oral TID  . insulin aspart  0-9 Units Subcutaneous TID WC  . insulin aspart  3 Units Subcutaneous TID WC  . insulin glargine  20 Units Subcutaneous QHS  . multivitamin  1 tablet Oral QHS   . sodium chloride 10 mL/hr at 12/29/19 2300  .  ceFAZolin (ANCEF) IV Stopped (01/05/20 9458)  . sodium bicarbonate (isotonic) 150 mEq in D5W 1000 mL infusion 75 mL/hr at 01/05/20 0249   sodium chloride, alum & mag hydroxide-simeth, calcium carbonate, cyclobenzaprine, guaiFENesin-dextromethorphan, hydrALAZINE, HYDROcodone-acetaminophen, lip balm, loratadine, menthol-cetylpyridinium **OR** phenol, metoCLOPramide **OR** metoCLOPramide (REGLAN) injection, morphine injection, Muscle Rub, ondansetron **OR** ondansetron (ZOFRAN) IV, polyvinyl alcohol, senna-docusate, sodium chloride

## 2020-01-05 NOTE — Progress Notes (Signed)
PROGRESS NOTE    [name]  MGQ:[id number] DOB: 12/02/1965 DOA: 12/28/2019 PCP: Julian Hy, PA-C    Brief Narrative:   The patient 54 year old female with DM, HTN, CKD 5, status post left foot Chopart amputation 2014 managed by podiatry, left SFA vascularization in 2020, prior CVA who came into the hospital after a ground-level fall resulting in left hip fracture.  Orthopedic surgery has consulted and she is status post operative repair 10/28.  Nephrology also consulted on admission due to chronic kidney disease and signed off since.  She is awaiting insurance authorization for CIR, ongoing problems are leukocytosis, renal failure, anemia.  Insurance authorization was unfortunately denied for CIR so we will attempt to have the patient go to SNF.  Will call podiatry for further evaluation recommendations given her stump.  Patient adamantly is refusing MRI of her foot.  Discussed with ID who recommends empiric antibiotic treatment with Ancef and obtaining a CRP and a ESR.  We will also consult podiatry for further evaluation and recommendations.  **Interim History She was started on a sodium bicarbonate drip yesterday and her acidosis has been improved.  Please asked nephrology to come back and reevaluate the patient.  Her WBC continues to worsen and podiatry was consulted yesterday for foot Dr. March Rummage of podiatry feels that the patient would benefit from an MRI for evaluation of the osseous erosion of the anterior border of the calcaneus.  Recommends continuing empiric antibiotic therapy.  Since WBC significantly worsened today we have consulted ID for further evaluation and formal consultation.  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 Lyla Glassing on 10/28 -PT recommended CIR, patient agreeable, insurance authorization was unfortunately denied and now social work is attempting to look for SNF PT continues recommend rehab and bed offers have been given to the patient but they are awaiting a PASARR  Acute on Chronic Anemia of Chronic Kidney Disease, postoperative acute blood loss anemia -hemoglobin dropped to 6.3, status post 2 units of packed red blood cells.  She was given Lasix x1.  -Hemoglobin dipping down over the last couple of days  -She has been transfused 3 units of PRBCs and repeat hemoglobin/hematocrit yesterday morning was 8.0/26.0 and today is relatively stable at 7.6/23.8 -Continue to monitor for signs and symptoms of bleeding; Currently no overt bleeding -Repeat CBC in the AM   Leukocytosis, worsening with concern for infection -Likely reactive in the setting of hip fracture, however was improving and then her WBC is back up today.   -WBC went from 17.6 -> 28.3 -> 18.6 and today it is 34.7 -She has no infectious symptoms currently.  She did have diarrhea a few days ago.   -Her foot does not look particularly infected but pictures reviewed by ID  -Last month her Foot Cx showed Staph and Strep G,  -Obtained an x-ray which showed "Prior left foot amputation. Soft tissue swelling noted over the  stump. Subtle erosive changes about the anterior aspect of the calcaneal stump cannot be excluded. Osteomyelitis cannot be excluded. Diffuse osteopenia and degenerative change." -Dr. Cruzita Lederer ordered an MRI however patient refuses stating "I do not think I need this test".  It was noted that an MRI has been  ordered as an outpatient also about 2 weeks ago and does not want to have it done inpatient -We obtained an ESR and this was elevated at 74 and a CRP was 20.1 -Ancef started and will continue Ancef at this time and will formally consult ID given her worsening leukocytosis. -Will also consult Podiatry for further evaluation and management; podiatry recommending MRI and feels like that she likely has an osteomyelitis; -I am concerned for some underlying foot infection or an abscess that the patient has and that we cannot evaluate.  We will continue for 7 to discussing with the patient about obtaining an MRI; she states "maybe tomorrow".  Hyponatremia -Patient's sodium has been trending down and trended down to 125 today. -Continue to Monitor and Trend   Acute Kidney Injury on Chronic Kidney Disease Stage V  Metabolic Acidosis Hyperphosphatemia -baseline creatinine around 4, her creatinine was in the six range on admission.   -Nephrology evaluated patient.  Due to lower extremity swelling she was diuresed with Lasix x2, 10/29-30.   -Creatinine has remained around 6 which may be her new baseline.  She has no uremic symptoms. -BUN/Cr is climbing some today.  -Started Sodium Bicarbonate Infusion at 75 mL/hr and nephrology recommending continuing for 1-2 more days  -patient's metabolic acidosis is slightly improved as her CO2 is now 16, chloride level is now 96, and anion gap is 13 -Avoid further nephrotoxic medications, contrast dyes, hypotension and renally dose medications -Continue to monitor and trend renal function carefully -Patient's Phos Level is now 6.0 -Await further recommendations from nephrology; nephrology feels that she does not have any urinary symptoms and does not feel that there is indication for dialysis yet; nephrology recommending continue IV fluids for another 1 to 2 days as patient is euvolemic on exam and they were to start the p.o. bicarbonate 1300 mg p.o. 3 times daily -Has  seen Vascular Surgery for Access in the past and nephrology consulting vascular surgery for possible access  Hypomagnesemia -Patient's magnesium level this morning was 1.5 -Replete with IV mag sulfate 1 g -Continue to monitor and replete as necessary -Repeat magnesium level in a.m.  Volume overload with bilateral lower extremity edema, improved  -improving with 2 doses of Lasix.  Swelling improved, hold further diuresis and now started back on IV fluids with sodium bicarbonate at 75 MLS per hour given her renal function; nephrology recommending continuing for 1-2 more days  Type 2 Diabetes Mellitus -Continue Lantus 20 units subcu nightly, sensitive NovoLog/scale insulin AC, 3 units of subcu insulin 3 times daily with meals CBGs are stable -CBG (last 3)  Recent Labs    01/04/20 2110 01/05/20 0735 01/05/20 1048  GLUCAP 243* 314* 254*  -Continue monitor blood sugars carefully and adjust insulin regimen as necessary and we have increased her Lantus to 24 units subcu nightly, increased her symptoms of NovoLog sliding scale insulin before meals and added at bedtime coverage and increased 3 units of subcu insulin 3 times daily with meals to 5 units   GERD -She has a PPI but was taking her home Dexilant which has now been sent to the pharmacy -Resume home Ly[place] and stop her PPI here.  Essential Hypertension -required  up titration of her antihypertensive regimen for couple days neuro, finally blood pressure better -Continue with carvedilol 25 mg p.o. twice daily, amlodipine 10 mg p.o. 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 Arman Filter note -Asked podiatry to evaluate; Dr. March Rummage feels that the patient has a left foot ulceration with concern for osteomyelitis given her osseous erosion of the anterior border of the calcaneus.  He recommends an MRI for evaluation of this to rule  out further abscess formation as well and recommends continuing empiric antibiotic therapy.  She is weightbearing status per orthopedics for her hip.  Dr. Sharen Counter recommends switching to [facility] wet-to-dry dressings and feels that she does have an osteomyelitis despite her foot appearing well without signs of symptoms of infection.;  He does not feel that urgent surgical debridement is necessary at this time and recommend proceeding with local wound care  Obesity -Complicates overall prognosis and care -Estimated body mass index is 30.05 kg/m as calculated from the following:   Height as of this encounter: $RemoveBeforeD'5\' 10"'[id number]$  (1.778 m).   Weight as of this encounter: 95 kg. -Weight Loss and Dietary Counseling given   DVT prophylaxis: Heparin Sq 5000 units q8h Code Status: FULL CODE Family Communication: No family present at bedside  Disposition Plan: Anticipating D/C to SNF  Status is: Inpatient  Remains inpatient appropriate because:Unsafe d/c plan, IV treatments appropriate due to intensity of illness or inability to take PO and Inpatient level of care appropriate due to severity of illness   Dispo: The patient is from: Home              Anticipated d/c is to: SNF              Anticipated d/c date is: 2 days              Patient currently is not medically stable to d/c.  Consultants:   Orthopedic Surgery  Nephrology  Podiatry   Infectious Diseases   Procedures: Intramedullary fixation, Left femur by Dr. Lyla Glassing  Antimicrobials:  Anti-infectives (From admission, onward)   Start     Dose/Rate Route Frequency Ordered Stop   01/04/20 1700  ceFAZolin (ANCEF) IVPB 1 g/50 mL premix        1 g 100 mL/hr over 30 Minutes Intravenous Every 12 hours 01/04/20 1611     12/29/19 1930  ceFAZolin (ANCEF) IVPB 2g/100 mL premix        2 g 200 mL/hr over 30 Minutes Intravenous Every 6 hours 12/29/19 1620 12/30/19 0201   12/29/19 1300  ceFAZolin (ANCEF) IVPB 2g/100 mL premix        2 g 200 mL/hr over  30 Minutes Intravenous On call to O.R. 12/29/19 0113 12/29/19 1325       Subjective: Seen And examined she states that she is doing okay.  Denies any nausea or vomiting and continues to refuse her MRI of her foot.  No chest pain, lightheadedness or dizziness.  No other concerns or complaints at this time and I discussed with her about how I am concerned about her elevating white blood cell count and her elevated inflammatory markers and she will think about obtaining an MRI tomorrow.  No other concerns or complaints at this time but nursing found pill bottles next to her and sent them down and patient has been taking her Dexilant by herself and had Lomotil as well.  We will need to watch the patient carefully and follow-up on ID recommendations given her worsening white blood cell count.  Objective: Vitals:   01/05/20 0957 01/05/20 1124 01/05/20 1250 01/05/20 1452  BP: 126/60  (!) 122/59   Pulse: 65  62 68  Resp: 19  18   Temp: 97.8 F (36.6 C)  97.6 F (36.4 C)   TempSrc: Oral  Oral   SpO2: 97%  98%   Weight:  95 kg    Height:        Intake/Output Summary (Last 24 hours) at 01/05/2020 1540 Last data filed at 01/05/2020 1400 Gross per 24 hour  Intake 1819.87 ml  Output -  Net 1819.87 ml   Filed Weights   12/28/19 1526 12/29/19 1147 01/05/20 1124  Weight: 88.6 kg 88.6 kg 95 kg   Examination: Physical Exam:  Constitutional: WN/WD obese Caucasian female currently in no acute distress resting Eyes: Lids and conjunctivae normal, sclerae anicteric  ENMT: External Ears, Nose appear normal. Grossly normal hearing.  Neck: Appears normal, supple, no cervical masses, normal ROM, no appreciable thyromegaly; no JVD Respiratory: Diminished to auscultation bilaterally, no wheezing, rales, rhonchi or crackles. Normal respiratory effort and patient is not tachypenic. No accessory muscle use. Unlabored breathing Cardiovascular: RRR, no murmurs / rubs / gallops. S1 and S2 auscultated. Has some  left leg extremity edema. Abdomen: Soft, non-tender, distended secondary body habitus. Bowel sounds positive.  GU: Deferred. Musculoskeletal: No clubbing / cyanosis of digits/nails. Has a left Chopart foot amputation and has an ulceration Skin: No rashes, lesions, ulcers. No induration; Warm and dry.  Neurologic: CN 2-12 grossly intact with no focal deficits. Romberg sign and cerebellar reflexes not assessed.  Psychiatric: Normal judgment and insight. Alert and oriented x 3. Normal mood and appropriate affect.   Data Reviewed: I have personally reviewed following labs and imaging studies  CBC: Recent Labs  Lab 01/01/20 0612 01/02/20 0844 01/03/20 0834 01/04/20 0521 01/05/20 0523  WBC 16.0* 17.6* 28.3* 18.6* 34.7*  NEUTROABS  --   --   --   --  30.5*  HGB 8.1* 7.5* 7.2* 8.0* 7.6*  HCT 25.7* 24.3* 23.4* 26.0* 23.8*  MCV 88.6 89.3 90.3 90.6 88.5  PLT 376 324 294 275 761   Basic Metabolic Panel: Recent Labs  Lab 01/01/20 0612 01/02/20 0844 01/03/20 0834 01/04/20 0521 01/05/20 0523  NA 134* 131* 128* 129* 125*  K 4.4 4.8 4.9 4.8 4.7  CL 106 105 104 104 96*  CO2 16* 15* 14* 14* 16*  GLUCOSE 139* 171* 158* 251* 352*  BUN 89* 87* 103* 111* 119*  CREATININE 5.98* 6.17* 6.22* 6.87* 6.76*  CALCIUM 8.1* 7.9* 8.0* 8.1* 8.0*  MG  --   --   --   --  1.5*  PHOS  --   --   --   --  6.0*   GFR: Estimated Creatinine Clearance: 12.2 mL/min (A) (by C-G formula based on SCr of 6.76 mg/dL (H)). Liver Function Tests: Recent Labs  Lab 12/30/19 0415 01/05/20 0523  AST 16 22  ALT 15 12  ALKPHOS 168* 216*  BILITOT 0.8 1.0  PROT 7.1 6.2*  ALBUMIN 2.4* 2.1*   No results for input(s): LIPASE, AMYLASE in the last 168 hours. No results for input(s): AMMONIA in the last 168 hours. Coagulation Profile: No results for input(s): INR, PROTIME in the last 168 hours. Cardiac Enzymes: No results for input(s): CKTOTAL, CKMB, CKMBINDEX, TROPONINI in the last 168 hours. BNP (last 3 results)  No  results for input(s): PROBNP in the last 8760 hours. HbA1C: No results for input(s): HGBA1C in the last 72 hours. CBG: Recent Labs  Lab 01/04/20 1555 01/04/20 1750 01/04/20 2110 01/05/20 0735 01/05/20 1048  GLUCAP 232* 226* 243* 314* 254*   Lipid Profile: No results for input(s): CHOL, HDL, LDLCALC, TRIG, CHOLHDL, LDLDIRECT in the last 72 hours. Thyroid Function Tests: No results for input(s): TSH, T4TOTAL, FREET4, T3FREE, THYROIDAB in the last 72 hours. Anemia Panel: No results for input(s): VITAMINB12, FOLATE, FERRITIN, TIBC, IRON, RETICCTPCT in the last 72 hours. Sepsis Labs: No results for input(s): PROCALCITON, LATICACIDVEN in the last 168 hours.  Recent Results (from the past 240 hour(s))  Respiratory Panel by RT PCR (Flu A&B, Covid) - Nasopharyngeal Swab     Status: None   Collection Time: 12/28/19 12:00 PM   Specimen: Nasopharyngeal Swab  Result Value Ref Range Status   SARS Coronavirus 2 by RT PCR NEGATIVE NEGATIVE Final    Comment: (NOTE) SARS-CoV-2 target nucleic acids are NOT DETECTED.  The SARS-CoV-2 RNA is generally detectable in upper respiratoy specimens during the acute phase of infection. The lowest concentration of SARS-CoV-2 viral copies this assay can detect is 131 copies/mL. A negative result does not preclude SARS-Cov-2 infection and should not be used as the sole basis for treatment or other patient management decisions. A negative result may occur with  improper specimen collection/handling, submission of specimen other than nasopharyngeal swab, presence of viral mutation(s) within the areas targeted by this assay, and inadequate number of viral copies (<131 copies/mL). A negative result must be combined with clinical observations, patient history, and epidemiological information. The expected result is Negative.  Fact Sheet for Patients:  PinkCheek.be  Fact Sheet for Healthcare Providers:   GravelBags.it  This test is no t yet approved or cleared by the [place] FDA and  has been authorized for detection and/or diagnosis of SARS-CoV-2 by FDA under an Emergency Use Authorization (EUA). This EUA will remain  in effect (meaning this test can be used) for the duration of the COVID-19 declaration under Section 564(b)(1) of the Act, 21 U.S.C. section 360bbb-3(b)(1), unless the authorization is terminated or revoked sooner.     Influenza A by PCR NEGATIVE NEGATIVE Final   Influenza B by PCR NEGATIVE NEGATIVE Final    Comment: (NOTE) The Xpert Xpress SARS-CoV-2/FLU/RSV assay is intended as an aid in  the diagnosis of influenza from Nasopharyngeal swab specimens and  should not be used as a sole basis for treatment. Nasal washings and  aspirates are unacceptable for Xpert Xpress SARS-CoV-2/FLU/RSV  testing.  Fact Sheet for Patients: PinkCheek.be  Fact Sheet for Healthcare Providers: GravelBags.it  This test is not yet approved or cleared by the [place] FDA and  has been authorized for detection and/or diagnosis of SARS-CoV-2 by  FDA under an Emergency Use Authorization (EUA). This EUA will remain  in effect (meaning this test can be used) for the duration of the  Covid-19 declaration under Section 564(b)(1) of the Act, 21  U.S.C. section 360bbb-3(b)(1), unless the authorization is  terminated or revoked. Performed at [facility], [place] [address] [place], [place] [address]   Surgical pcr screen     Status: Abnormal   Collection Time: 12/28/19  4:50 PM   Specimen: Nasal Mucosa; Nasal Swab  Result Value Ref Range Status   MRSA, PCR NEGATIVE NEGATIVE Final   Staphylococcus aureus POSITIVE (A) NEGATIVE Final    Comment: (NOTE) The Xpert SA  Assay (FDA approved for NASAL specimens in patients 83 years of age and older), is one component of a  comprehensive surveillance program. It is not intended to diagnose infection nor to guide or monitor treatment. Performed at [facility], [place] [address], [place], [place] [address]   Culture, blood (routine x 2)     Status: None (Preliminary result)   Collection Time: 01/04/20  5:43 PM   Specimen: BLOOD LEFT HAND  Result Value Ref Range Status   Specimen Description   Final    BLOOD LEFT HAND Performed at [facility] [address] [place], [place] [address]    Special Requests   Final    BOTTLES DRAWN AEROBIC AND ANAEROBIC Blood Culture adequate volume Performed at [facility] [address] [place], [place] [address]    Culture   Final    NO GROWTH < 12 HOURS Performed at [facility] [address], [place], [place] [address]    Report Status PENDING  Incomplete  Culture, blood (routine x 2)     Status: None (Preliminary result)   Collection Time: 01/04/20  5:51 PM   Specimen: BLOOD  Result Value Ref Range Status   Specimen Description   Final    BLOOD RIGHT ANTECUBITAL Performed at [facility] [address], [place], [place] [address]    Special Requests   Final    BOTTLES DRAWN AEROBIC ONLY Blood Culture results may not be optimal due to an inadequate volume of blood received in culture bottles Performed at [facility] [address], [place], [place] [address]    Culture   Final    NO GROWTH < 12 HOURS Performed at [facility] [address] [place] [address], [place], [place] [address]    Report Status PENDING  Incomplete     RN Pressure Injury Documentation:     Estimated body mass index is 30.05 kg/m as calculated from the following:   Height as of this encounter: $RemoveBeforeD'5\' 10"'[id number]$  (1.778 m).   Weight as of this encounter: 95 kg.  Malnutrition Type:  Nutrition Problem: Increased nutrient needs Etiology: post-op healing   Malnutrition  Characteristics:  Signs/Symptoms: estimated needs   Nutrition Interventions:  Interventions: Other (Comment) (rena-vite, prosource plus)   Radiology Studies: No results found. Scheduled Meds: . (feeding supplement) PROSource Plus  30 mL Oral TID BM  . alosetron  1 mg Oral BID  . amLODipine  10 mg Oral Daily  . atorvastatin  40 mg Oral q1800  . carvedilol  25 mg Oral BID WC  . Chlorhexidine Gluconate Cloth  6 each Topical Daily  . collagenase   Topical Daily  . dexlansoprazole  60 mg Oral Daily  . docusate sodium  100 mg Oral BID  . DULoxetine  60 mg Oral Daily  . heparin injection (subcutaneous)  5,000 Units Subcutaneous Q8H  . hydrALAZINE  100 mg Oral TID  . insulin aspart  0-9 Units Subcutaneous TID WC  . insulin aspart  3 Units Subcutaneous TID WC  . insulin glargine  20 Units Subcutaneous QHS  . multivitamin  1 tablet Oral QHS  . sodium bicarbonate  1,300 mg Oral TID   Continuous Infusions: . sodium chloride 10 mL/hr at 12/29/19 2300  .  ceFAZolin (ANCEF) IV Stopped (01/05/20 7672)  . sodium bicarbonate (isotonic) 150 mEq in D5W 1000 mL infusion 75 mL/hr at 01/05/20 0249    LOS: 8 days  Donna Chamber Latif Jacolby Risby, DO [facility] PAGER is on AMION  If 7PM-7AM, please contact night-coverage [email or web address]   

## 2020-01-05 NOTE — Progress Notes (Signed)
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    PT Comments    Pt found in bed and reports no L hip pain before beginning session. HR recorded at 62. She requires Min A to get to EOB; she needs increased time due to weakness and lack of endurance. Therapist assists with getting L LE to floor. Reports no pain at EOB. Pt asks to use BSC and needs Max A +2 in order to power up from elevated bed surface. Mod A for standing pivot due to icreased time needed to complete, and LE weakness. Mod A to sit secondary to weakness of B LEs which results in lack of control descending to seated position. Pt unable to hold self up in standing with AD and assist from therapist  for increased period of time. Pt c/o pain in sitting on BSC, requests to get back into bed and specifically declines sitting in chair without being prompted. Max A to stand from [facility]. Lack of endurance noted as pt is now taking shorter and slower shuffles during pivot. Pt cannot back up to EOB because she "feels like I'm falling backwards", per pt statement. Bed brought up to her for stand to sit. HR recorded at 68. No pain reported.   Follow Up Recommendations  SNF;Supervision/Assistance - 24 hour     Equipment Recommendations  Wheelchair (measurements PT);Wheelchair cushion (measurements PT);Rolling walker with 5" wheels    Recommendations for Other Services Rehab consult     Precautions / Restrictions Precautions Precautions: Fall Precaution Comments: has been limited WB on L foot plantar due to wound, using Knee walker  PTA. Restrictions Weight Bearing Restrictions: Yes LLE Weight Bearing: Weight bearing as tolerated Other Position/Activity Restrictions: WBAT on Left hip but has been essentially NWB on L plantar PTA    Mobility  Bed Mobility Overal bed mobility: Needs Assistance Bed Mobility: Supine to Sit     Supine to sit: Min assist (Min A to assist in getting L LE to EOB. Pt struggles to get to EOB)   Transfers Overall transfer level: Needs assistance Equipment used: Rolling walker (2 wheeled) Transfers: Sit to/from [facility] Sit to Stand: Max assist;+2 physical assistance Stand pivot transfers: Mod assist;+2 physical assistance       General transfer comment: Max A to stand from elevated bed. Pt requires a lot of assist to lift off. Slow shuffle; unable to back up to bed due to "feeling like I'm falling backwards". Bed brough up to pt for safety.  Ambulation/Gait                 Stairs             Wheelchair Mobility    Modified Rankin (Stroke Patients Only)       Balance                                            Cognition Arousal/Alertness: Awake/alert Behavior During Therapy: WFL for tasks assessed/performed;Flat affect Overall Cognitive Status: Within Functional Limits for  tasks assessed                                        Exercises      General Comments        Pertinent Vitals/Pain Pain Assessment: 0-10 Pain Score: 10-Worst pain ever (Pain level at a "20" while pt sitting on BSC, per pt statement. Otherwise, 0 pain) Pain Location: Left Hip Pain Descriptors / Indicators: Guarding;Grimacing;Sore    Home Living                      Prior Function            PT Goals (current goals can now be found in the care plan section) Acute Rehab PT Goals Patient Stated Goal: to go home PT Goal Formulation: With patient Time For Goal Achievement: 01/13/20 Potential to Achieve Goals:  Good Additional Goals Additional Goal #1: set up and propel WC with min assist x 50' Progress towards PT goals: Progressing toward goals    Frequency    Min 6X/week      PT Plan Current plan remains appropriate    Co-evaluation PT/OT/SLP Co-Evaluation/Treatment: Yes            AM-PAC PT "6 Clicks" Mobility   Outcome Measure  Help needed turning from your back to your side while in a flat bed without using bedrails?: A Lot Help needed moving from lying on your back to sitting on the side of a flat bed without using bedrails?: A Lot Help needed moving to and from a bed to a chair (including a wheelchair)?: A Lot Help needed standing up from a chair using your arms (e.g., wheelchair or bedside chair)?: A Lot Help needed to walk in hospital room?: Total Help needed climbing 3-5 steps with a railing? : Total 6 Click Score: 10    End of Session Equipment Utilized During Treatment: Gait belt Activity Tolerance: Patient limited by fatigue Patient left: with call bell/phone within reach;in bed Nurse Communication: Mobility status PT Visit Diagnosis: Difficulty in walking, not elsewhere classified (R26.2);Pain Pain - Right/Left: Left Pain - part of body: Hip     Time: 6962-9528 PT Time Calculation (min) (ACUTE ONLY): 30 min  Charges:  $Therapeutic Activity: 23-37 mins                     C. Parks [place], [place] [phone]

## 2020-01-05 NOTE — Consult Note (Signed)
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 female  With T2DM with peripheral neuropathy, CKD5, with hx of chronic osteomyelitis to left ankle/foot s/p TM amputation admitted for fall and found to haveleft intertrochanteric femur fracture where she underwent IM fixation of left femur by dr Lyla Glassing on 10/28. Since then, still having significant pain and leukocytosis. She concurrently has had draining ulcer to her left TM foot for the past 6 months, more purulent drainage during the hospitalization. She remains afebrile, but her WBC elevated at 35K, up from 18K yesterday. Plain xrays show calcaneal erosion but unable to exclude osteomyelitis. She is unwilling to do mri at the moment due to pain.  Past Medical History:  Diagnosis Date  . Anxiety 2002  . Chest tightness   . Depression 2001  . Diabetes type 1, uncontrolled ([place])    at age 50  . Diabetic neuropathy ([place])   . Essential hypertension 2015  . GERD (gastroesophageal reflux disease)    about age of 29  . Nausea and vomiting in adult   . Stroke ([place])   . Urinary frequency   . Yeast vaginitis     Allergies:  Allergies  Allergen Reactions  . Lidocaine Itching  . Trazodone Swelling  . Latex Rash    MEDICATIONS: . (feeding supplement) PROSource Plus  30 mL Oral TID BM  . alosetron  1 mg Oral BID  . amLODipine  10 mg Oral Daily  . atorvastatin  40 mg Oral q1800  . carvedilol  25 mg Oral BID WC  . Chlorhexidine  Gluconate Cloth  6 each Topical Daily  . collagenase   Topical Daily  . dexlansoprazole  60 mg Oral Daily  . docusate sodium  100 mg Oral BID  . DULoxetine  60 mg Oral Daily  . heparin injection (subcutaneous)  5,000 Units Subcutaneous Q8H  . hydrALAZINE  100 mg Oral TID  . insulin aspart  0-5 Units Subcutaneous QHS  . insulin aspart  0-9 Units Subcutaneous TID WC  . insulin aspart  3 Units Subcutaneous TID WC  . insulin glargine  24 Units Subcutaneous QHS  . multivitamin  1 tablet Oral QHS  . sodium bicarbonate  1,300 mg Oral TID    Social History   Tobacco Use  . Smoking status: Light Tobacco Smoker    Packs/day: 0.25    Years: 22.00    Pack years: 5.50    Types: Cigarettes  . Smokeless tobacco: Never Used  Vaping Use  . Vaping Use: Never used  Substance Use Topics  . Alcohol use: Yes    Alcohol/week: 0.0 standard drinks    Comment: sociial  . Drug use: No    Family History  Problem Relation Age of Onset  . Diabetes Mother   . Heart attack Mother   . Heart disease Mother        before age  51  . Hypertension Mother   . Hyperlipidemia Mother   . Diabetes Father   . Heart attack Father   . Heart disease Father        before age 4  . Diabetes Sister   . Hypertension Sister     Review of Systems  Constitutional: Negative for fever, chills, diaphoresis, activity change, appetite change, fatigue and unexpected weight change.  HENT: Negative for congestion, sore throat, rhinorrhea, sneezing, trouble swallowing and sinus pressure.  Eyes: Negative for photophobia and visual disturbance.  Respiratory: Negative for cough, chest tightness, shortness of breath, wheezing and stridor.  Cardiovascular: Negative for chest pain, palpitations and leg swelling.  Gastrointestinal: Negative for nausea, vomiting, abdominal pain, diarrhea, constipation, blood in stool, abdominal distention and anal bleeding.  Genitourinary: Negative for dysuria, hematuria, flank pain and difficulty  urinating.  Musculoskeletal: Negative for myalgias, back pain, joint swelling, arthralgias and gait problem.  Skin: +left foot wound Neurological: Negative for dizziness, tremors, weakness and light-headedness.  Hematological: Negative for adenopathy. Does not bruise/bleed easily.  Psychiatric/Behavioral: Negative for behavioral problems, confusion, sleep disturbance, dysphoric mood, decreased concentration and agitation.      OBJECTIVE: Temp:  [97.6 F (36.4 C)-98.6 F (37 C)] 97.6 F (36.4 C) (11/04 1250) Pulse Rate:  [62-68] 68 (11/04 1452) Resp:  [18-19] 18 (11/04 1250) BP: (116-137)/(49-60) 122/59 (11/04 1250) SpO2:  [96 %-98 %] 98 % (11/04 1250) Weight:  [95 kg] 95 kg (11/04 1124) Physical Exam  Constitutional:  oriented to person, place, and time. appears well-developed and well-nourished. No distress.  HENT: [place]/AT, PERRLA, no scleral icterus Mouth/Throat: Oropharynx is clear and moist. No oropharyngeal exudate.  Cardiovascular: Normal rate, regular rhythm and normal heart sounds. Exam reveals no gallop and no friction rub.  No murmur heard.  Pulmonary/Chest: Effort normal and breath sounds normal. No respiratory distress.  has no wheezes.  Neck = supple, no nuchal rigidity Abdominal: Soft. Bowel sounds are normal.  exhibits no distension. There is no tenderness.  Pelvis= left hip dressing Lymphadenopathy: no cervical adenopathy. No axillary adenopathy Neurological: alert and oriented to person, place, and time.  Skin: Skin is warm and dry. No rash noted. No erythema.  PIR:JJOA foot TM amputation with deep ulcer near calcenous with purulent drainage but not much surrounding erythema Psychiatric: a normal mood and affect.  behavior is normal.   LABS: Results for orders placed or performed during the hospital encounter of 12/28/19 (from the past 48 hour(s))  Glucose, capillary     Status: Abnormal   Collection Time: 01/03/20  8:15 PM  Result Value Ref Range    Glucose-Capillary 208 (H) 70 - 99 mg/dL    Comment: Glucose reference range applies only to samples taken after fasting for at least 8 hours.  CBC     Status: Abnormal   Collection Time: 01/04/20  5:21 AM  Result Value Ref Range   WBC 18.6 (H) 4.0 - 10.5 K/uL   RBC 2.87 (L) 3.87 - 5.11 MIL/uL   Hemoglobin 8.0 (L) 12.0 - 15.0 g/dL   HCT 26.0 (L) 36 - 46 %   MCV 90.6 80.0 - 100.0 fL   MCH 27.9 26.0 - 34.0 pg   MCHC 30.8 30.0 - 36.0 g/dL   RDW 15.1 11.5 - 15.5 %   Platelets 275 150 - 400 K/uL   nRBC 0.0 0.0 - 0.2 %    Comment: Performed at [facility], [place] [address], [place], [place] [address]  Basic metabolic panel  Status: Abnormal   Collection Time: 01/04/20  5:21 AM  Result Value Ref Range   Sodium 129 (L) 135 - 145 mmol/L   Potassium 4.8 3.5 - 5.1 mmol/L   Chloride 104 98 - 111 mmol/L   CO2 14 (L) 22 - 32 mmol/L   Glucose, Bld 251 (H) 70 - 99 mg/dL    Comment: Glucose reference range applies only to samples taken after fasting for at least 8 hours.   BUN 111 (H) 6 - 20 mg/dL    Comment: RESULTS CONFIRMED BY MANUAL DILUTION   Creatinine, Ser 6.87 (H) 0.44 - 1.00 mg/dL   Calcium 8.1 (L) 8.9 - 10.3 mg/dL   GFR, Estimated 7 (L) >60 mL/min    Comment: (NOTE) Calculated using the CKD-EPI Creatinine Equation (2021)    Anion gap 11 5 - 15    Comment: Performed at [facility], [place] [address], [place], [place] [address]  Glucose, capillary     Status: Abnormal   Collection Time: 01/04/20  7:51 AM  Result Value Ref Range   Glucose-Capillary 248 (H) 70 - 99 mg/dL    Comment: Glucose reference range applies only to samples taken after fasting for at least 8 hours.  Glucose, capillary     Status: Abnormal   Collection Time: 01/04/20 11:54 AM  Result Value Ref Range   Glucose-Capillary 265 (H) 70 - 99 mg/dL    Comment: Glucose reference range applies only to samples taken after fasting for at least 8 hours.   Comment 1 Document in Chart    Glucose, capillary     Status: Abnormal   Collection Time: 01/04/20  3:55 PM  Result Value Ref Range   Glucose-Capillary 232 (H) 70 - 99 mg/dL    Comment: Glucose reference range applies only to samples taken after fasting for at least 8 hours.  Culture, blood (routine x 2)     Status: None (Preliminary result)   Collection Time: 01/04/20  5:43 PM   Specimen: BLOOD LEFT HAND  Result Value Ref Range   Specimen Description      BLOOD LEFT HAND Performed at [facility] [address], [place], [place] [address]    Special Requests      BOTTLES DRAWN AEROBIC AND ANAEROBIC Blood Culture adequate volume Performed at [facility] [address], [place], [place] [address]    Culture      NO GROWTH < 12 HOURS Performed at [facility] [address] [place], [place] [address]    Report Status PENDING   Glucose, capillary     Status: Abnormal   Collection Time: 01/04/20  5:50 PM  Result Value Ref Range   Glucose-Capillary 226 (H) 70 - 99 mg/dL    Comment: Glucose reference range applies only to samples taken after fasting for at least 8 hours.   Comment 1 Notify RN    Comment 2 Document in Chart   Culture, blood (routine x 2)     Status: None (Preliminary result)   Collection Time: 01/04/20  5:51 PM   Specimen: BLOOD  Result Value Ref Range   Specimen Description      BLOOD RIGHT ANTECUBITAL Performed at [facility], [place] [address], [place], [place] [address]    Special Requests      BOTTLES DRAWN AEROBIC ONLY Blood Culture results may not be optimal due to an inadequate volume of blood received in culture bottles Performed at [facility], [place] [address] [place], [place] [address]    Culture      NO GROWTH < 12 HOURS Performed at [facility] [address], [place], [place] [address]    Report Status PENDING   Glucose, capillary     Status: Abnormal   Collection Time: 01/04/20   9:10 PM  Result Value Ref Range   Glucose-Capillary 243 (H) 70 - 99 mg/dL    Comment: Glucose reference range applies only to samples taken after fasting for at least 8 hours.  Sedimentation rate     Status: Abnormal   Collection Time: 01/05/20  5:23 AM  Result Value Ref Range   Sed Rate 74 (H) 0 - 22 mm/hr    Comment: Performed at [facility], [place] [address], [place], [place] [address]  C-reactive protein     Status: Abnormal   Collection Time: 01/05/20  5:23 AM  Result Value Ref Range   CRP 20.1 (H) <1.0 mg/dL    Comment: Performed at [facility], [place] [address], [place], [place] [address]  CBC with Differential/Platelet     Status: Abnormal   Collection Time: 01/05/20  5:23 AM  Result Value Ref Range   WBC 34.7 (H) 4.0 - 10.5 K/uL   RBC 2.69 (L) 3.87 - 5.11 MIL/uL   Hemoglobin 7.6 (L) 12.0 - 15.0 g/dL   HCT 23.8 (L) 36 - 46 %   MCV 88.5 80.0 - 100.0 fL   MCH 28.3 26.0 - 34.0 pg   MCHC 31.9 30.0 - 36.0 g/dL   RDW 15.2 11.5 - 15.5 %   Platelets 241 150 - 400 K/uL   nRBC 0.0 0.0 - 0.2 %   Neutrophils Relative % 88 %   Neutro Abs 30.5 (H) 1.7 - 7.7 K/uL   Lymphocytes Relative 3 %   Lymphs Abs 1.1 0.7 - 4.0 K/uL   Monocytes Relative 6 %   Monocytes Absolute 1.9 (H) 0.1 - 1.0 K/uL   Eosinophils Relative 0 %   Eosinophils Absolute 0.0 0.0 - 0.5 K/uL   Basophils Relative 0 %   Basophils Absolute 0.1 0.0 - 0.1 K/uL   Immature Granulocytes 3 %   Abs Immature Granulocytes 1.03 (H) 0.00 - 0.07 K/uL   Burr Cells PRESENT    Polychromasia PRESENT    Ovalocytes PRESENT     Comment: Performed at [facility], [place] [address] [place], [place] [address]  Comprehensive metabolic panel     Status: Abnormal   Collection Time: 01/05/20  5:23 AM  Result Value Ref Range   Sodium 125 (L) 135 - 145 mmol/L   Potassium 4.7 3.5 - 5.1 mmol/L   Chloride 96 (L) 98 - 111 mmol/L   CO2 16 (L) 22 - 32 mmol/L   Glucose, Bld 352 (H) 70 - 99  mg/dL    Comment: Glucose reference range applies only to samples taken after fasting for at least 8 hours.   BUN 119 (H) 6 - 20 mg/dL    Comment: RESULTS CONFIRMED BY MANUAL DILUTION   Creatinine, Ser 6.76 (H) 0.44 - 1.00 mg/dL   Calcium 8.0 (L) 8.9 - 10.3 mg/dL   Total Protein 6.2 (L) 6.5 - 8.1 g/dL   Albumin 2.1 (L) 3.5 - 5.0 g/dL   AST 22 15 - 41 U/L   ALT 12 0 - 44 U/L   Alkaline Phosphatase 216 (H) 38 - 126 U/L   Total Bilirubin 1.0 0.3 -  1.2 mg/dL   GFR, Estimated 7 (L) >60 mL/min    Comment: (NOTE) Calculated using the CKD-EPI Creatinine Equation (2021)    Anion gap 13 5 - 15    Comment: Performed at [facility], [place] [address] [place], [place] [address]  Phosphorus     Status: Abnormal   Collection Time: 01/05/20  5:23 AM  Result Value Ref Range   Phosphorus 6.0 (H) 2.5 - 4.6 mg/dL    Comment: Performed at [facility], [place] [address], [place], [place] [address]  Magnesium     Status: Abnormal   Collection Time: 01/05/20  5:23 AM  Result Value Ref Range   Magnesium 1.5 (L) 1.7 - 2.4 mg/dL    Comment: Performed at [facility], [place] [address], [place], [place] [address]  Glucose, capillary     Status: Abnormal   Collection Time: 01/05/20  7:35 AM  Result Value Ref Range   Glucose-Capillary 314 (H) 70 - 99 mg/dL    Comment: Glucose reference range applies only to samples taken after fasting for at least 8 hours.  Glucose, capillary     Status: Abnormal   Collection Time: 01/05/20 10:48 AM  Result Value Ref Range   Glucose-Capillary 254 (H) 70 - 99 mg/dL    Comment: Glucose reference range applies only to samples taken after fasting for at least 8 hours.  Glucose, capillary     Status: Abnormal   Collection Time: 01/05/20  4:53 PM  Result Value Ref Range   Glucose-Capillary 206 (H) 70 - 99 mg/dL    Comment: Glucose reference range applies only to samples taken after fasting for at least 8 hours.     MICRO: mssa colonized Blood cx pending IMAGING: Reviewed xray of left foot, see signs of bone erosion  Assessment/Plan:  54yo F with T2DM, diabetic neuropathy, hx of left foot TM amputation who has had 6 months ago developed ulcer thought to be due to prosthetic, now with purulent drainage concerning for deeper abscess/osteomyelitis  -continue on cefazolin, will dose adjust(increase) for her kidney function - recommend to get mri sans contrast to see if abscess/osteo, may need surgical debridement.  Health maintenance - recommend hep C Ab screening - would recommend to have her get covid vaccine if not already had a dose.

## 2020-01-06 ENCOUNTER — Inpatient Hospital Stay (HOSPITAL_COMMUNITY): Payer: Medicaid Other

## 2020-01-06 DIAGNOSIS — N185 Chronic kidney disease, stage 5: Secondary | ICD-10-CM | POA: Diagnosis not present

## 2020-01-06 DIAGNOSIS — N179 Acute kidney failure, unspecified: Secondary | ICD-10-CM | POA: Diagnosis not present

## 2020-01-06 DIAGNOSIS — E1165 Type 2 diabetes mellitus with hyperglycemia: Secondary | ICD-10-CM | POA: Diagnosis not present

## 2020-01-06 DIAGNOSIS — I120 Hypertensive chronic kidney disease with stage 5 chronic kidney disease or end stage renal disease: Secondary | ICD-10-CM | POA: Diagnosis not present

## 2020-01-06 DIAGNOSIS — L97524 Non-pressure chronic ulcer of other part of left foot with necrosis of bone: Secondary | ICD-10-CM | POA: Diagnosis not present

## 2020-01-06 DIAGNOSIS — L02612 Cutaneous abscess of left foot: Secondary | ICD-10-CM | POA: Diagnosis not present

## 2020-01-06 DIAGNOSIS — M86172 Other acute osteomyelitis, left ankle and foot: Secondary | ICD-10-CM | POA: Diagnosis not present

## 2020-01-06 DIAGNOSIS — L0390 Cellulitis, unspecified: Secondary | ICD-10-CM | POA: Diagnosis not present

## 2020-01-06 DIAGNOSIS — E1129 Type 2 diabetes mellitus with other diabetic kidney complication: Secondary | ICD-10-CM | POA: Diagnosis not present

## 2020-01-06 DIAGNOSIS — S91302A Unspecified open wound, left foot, initial encounter: Secondary | ICD-10-CM | POA: Diagnosis not present

## 2020-01-06 DIAGNOSIS — E118 Type 2 diabetes mellitus with unspecified complications: Secondary | ICD-10-CM | POA: Diagnosis not present

## 2020-01-06 DIAGNOSIS — D649 Anemia, unspecified: Secondary | ICD-10-CM | POA: Diagnosis not present

## 2020-01-06 DIAGNOSIS — T148XXA Other injury of unspecified body region, initial encounter: Secondary | ICD-10-CM | POA: Diagnosis not present

## 2020-01-06 DIAGNOSIS — E8770 Fluid overload, unspecified: Secondary | ICD-10-CM | POA: Diagnosis not present

## 2020-01-06 DIAGNOSIS — M7989 Other specified soft tissue disorders: Secondary | ICD-10-CM | POA: Diagnosis not present

## 2020-01-06 LAB — CBC WITH DIFFERENTIAL/PLATELET
Abs Immature Granulocytes: 0.39 10*3/uL — ABNORMAL HIGH (ref 0.00–0.07)
Basophils Absolute: 0 10*3/uL (ref 0.0–0.1)
Basophils Relative: 0 %
Eosinophils Absolute: 0.1 10*3/uL (ref 0.0–0.5)
Eosinophils Relative: 0 %
HCT: 22.6 % — ABNORMAL LOW (ref 36.0–46.0)
Hemoglobin: 7.1 g/dL — ABNORMAL LOW (ref 12.0–15.0)
Immature Granulocytes: 2 %
Lymphocytes Relative: 5 %
Lymphs Abs: 1 10*3/uL (ref 0.7–4.0)
MCH: 28 pg (ref 26.0–34.0)
MCHC: 31.4 g/dL (ref 30.0–36.0)
MCV: 89 fL (ref 80.0–100.0)
Monocytes Absolute: 1.8 10*3/uL — ABNORMAL HIGH (ref 0.1–1.0)
Monocytes Relative: 9 %
Neutro Abs: 17.9 10*3/uL — ABNORMAL HIGH (ref 1.7–7.7)
Neutrophils Relative %: 84 %
Platelets: 210 10*3/uL (ref 150–400)
RBC: 2.54 MIL/uL — ABNORMAL LOW (ref 3.87–5.11)
RDW: 15 % (ref 11.5–15.5)
WBC: 21.2 10*3/uL — ABNORMAL HIGH (ref 4.0–10.5)
nRBC: 0 % (ref 0.0–0.2)

## 2020-01-06 LAB — GLUCOSE, CAPILLARY
Glucose-Capillary: 223 mg/dL — ABNORMAL HIGH (ref 70–99)
Glucose-Capillary: 183 mg/dL — ABNORMAL HIGH (ref 70–99)
Glucose-Capillary: 151 mg/dL — ABNORMAL HIGH (ref 70–99)
Glucose-Capillary: 154 mg/dL — ABNORMAL HIGH (ref 70–99)

## 2020-01-06 LAB — COMPREHENSIVE METABOLIC PANEL
ALT: 12 U/L (ref 0–44)
AST: 31 U/L (ref 15–41)
Albumin: 1.9 g/dL — ABNORMAL LOW (ref 3.5–5.0)
Alkaline Phosphatase: 223 U/L — ABNORMAL HIGH (ref 38–126)
Anion gap: 13 (ref 5–15)
BUN: 123 mg/dL — ABNORMAL HIGH (ref 6–20)
CO2: 20 mmol/L — ABNORMAL LOW (ref 22–32)
Calcium: 7.4 mg/dL — ABNORMAL LOW (ref 8.9–10.3)
Chloride: 93 mmol/L — ABNORMAL LOW (ref 98–111)
Creatinine, Ser: 6.64 mg/dL — ABNORMAL HIGH (ref 0.44–1.00)
GFR, Estimated: 7 mL/min — ABNORMAL LOW (ref >60)
Glucose, Bld: 248 mg/dL — ABNORMAL HIGH (ref 70–99)
Potassium: 3.9 mmol/L (ref 3.5–5.1)
Sodium: 126 mmol/L — ABNORMAL LOW (ref 135–145)
Total Bilirubin: 0.9 mg/dL (ref 0.3–1.2)
Total Protein: 5.8 g/dL — ABNORMAL LOW (ref 6.5–8.1)

## 2020-01-06 LAB — PHOSPHORUS: Phosphorus: 5.5 mg/dL — ABNORMAL HIGH (ref 2.5–4.6)

## 2020-01-06 LAB — MAGNESIUM: Magnesium: 1.5 mg/dL — ABNORMAL LOW (ref 1.7–2.4)

## 2020-01-06 LAB — IRON AND TIBC
Iron: 15 ug/dL — ABNORMAL LOW (ref 28–170)
Saturation Ratios: 10 % — ABNORMAL LOW (ref 10.4–31.8)
TIBC: 145 ug/dL — ABNORMAL LOW (ref 250–450)
UIBC: 130 ug/dL

## 2020-01-06 MED ORDER — SODIUM BICARBONATE 650 MG PO TABS
1300.0000 mg | ORAL_TABLET | Freq: Two times a day (BID) | ORAL | Status: DC
  Administered 2020-01-06 – 2020-01-10 (×10): 1300 mg via ORAL
  Filled 2020-01-06 (×10): qty 2

## 2020-01-06 MED ORDER — MAGNESIUM SULFATE IN D5W 1-5 GM/100ML-% IV SOLN
1.0000 g | Freq: Once | INTRAVENOUS | Status: AC
  Administered 2020-01-06: 1 g via INTRAVENOUS
  Filled 2020-01-06: qty 100

## 2020-01-06 NOTE — TOC Progression Note (Addendum)
Transition of Care [facility]) - Progression Note    Patient Details  Name: [name] MRN: [id number] Date of Birth: Apr 04, 1965  Transition of [facility]) CM/SW Contact  Eban Weick, Juliann Pulse, RN Phone Number: 01/06/2020, 11:47 AM  Clinical Narrative:Patient for transfer to [facility].Awaiting pasrr;awaiting bed choice for SNF.    2:33p-Received pasrr.   Expected Discharge Plan: [place] Barriers to Discharge: Continued Medical Work up  Expected Discharge Plan and Services Expected Discharge Plan: [place]   Discharge Planning Services: CM Consult Post Acute Care Choice: IP Rehab Living arrangements for the past 2 months: Single Family Home                                       Social Determinants of Health (SDOH) Interventions    Readmission Risk Interventions No flowsheet data found.

## 2020-01-06 NOTE — Progress Notes (Signed)
PT Cancellation Note  Patient Details Name: [name] MRN: [id number] DOB: 25-Jan-1966   Cancelled Treatment:    Reason Eval/Treat Not Completed: Medical issues which prohibited therapy, patient transferred to [facility] for  HD.  [name] 01/06/2020, 3:55 PM [place] Pager [phone] Office [phone]

## 2020-01-06 NOTE — NC FL2 (Signed)
[place] LEVEL OF CARE SCREENING TOOL     IDENTIFICATION  Patient Name: [name] Birthdate: [birth year] Sex: female Admission Date (Current Location): 12/28/2019  [facility] and [place] Number:  Herbalist and Address:  [facility],  [place], [place]      Provider Number: [id number]  Attending Physician Name and Address:  Kerney Elbe, DO  Relative Name and Phone Number:  Delene Morais (dtr) [phone]    Current Level of Care: Hospital Recommended Level of Care: [place] Prior Approval Number:    Date Approved/Denied:   PASRR Number: [id number]  Discharge Plan: SNF    Current Diagnoses: Patient Active Problem List   Diagnosis Date Noted  . Chronic osteomyelitis involving left ankle and foot ([place])   . Ulcer of left foot with necrosis of bone ([place])   . Fracture 12/28/2019  . AKI (acute kidney injury) ([place]) 12/28/2019  . Volume overload 12/28/2019  . Acute on chronic anemia 12/28/2019  . CKD (chronic kidney disease) stage 5, GFR less than 15 ml/min (HCC) 12/06/2019  . History of [place] amputation of left foot ([place]) 12/25/2017  . Cerebral thrombosis with cerebral infarction 04/08/2017  . Acute right-sided weakness 04/07/2017  . Slurred speech 04/07/2017  . Right sided weakness 04/07/2017  . Hyperhidrosis 09/01/2016  . Migraine with aura and without status migrainosus, not intractable 07/28/2016  . 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[place]) 08/04/2014  . CKD stage 3 due to type 2 diabetes mellitus ([place]) 06/27/2014  . Vitamin D insufficiency 05/08/2014  . Obesity (BMI 30.0-34.9) 05/08/2014  . Unilateral amputation of left foot ([place]) 05/08/2014  . Bursitis of left shoulder 02/14/2014  . Neck pain 01/03/2014  . Diabetes mellitus type 2, uncontrolled, with complications ([place]) 92/01/9416  . 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.23) 01/14/2013   . Insomnia 08/04/2012  . Diabetic neuropathy, painful ([place]) 08/04/2011  . Anxiety and depression 05/16/2010  . Essential hypertension, benign 11/01/2007  . Female stress incontinence 11/01/2007  . TOBACCO DEPENDENCE 04/30/2006  . GASTROESOPHAGEAL REFLUX, NO ESOPHAGITIS 04/30/2006  . Irritable bowel syndrome 04/30/2006    Orientation RESPIRATION BLADDER Height & Weight     Self, Time, Situation, Place  Normal Incontinent (Occasional) Weight: 95.7 kg Height:  5\' 10"  (177.8 cm)  BEHAVIORAL SYMPTOMS/MOOD NEUROLOGICAL BOWEL NUTRITION STATUS      Incontinent (Occasional) Diet (CHO MOD)  AMBULATORY STATUS COMMUNICATION OF NEEDS Skin   Limited Assist Verbally Surgical wounds (L hip surgical site;L foot chopart-dsg changes-betadine,gauze,kerlix qd)                       Personal Care Assistance Level of Assistance  Bathing, Dressing Bathing Assistance: Limited assistance   Dressing Assistance: Limited assistance     Functional Limitations Info  Sight, Speech, Hearing Sight Info: Adequate Hearing Info: Adequate Speech Info: Adequate    SPECIAL CARE FACTORS FREQUENCY  PT (By licensed PT), OT (By licensed OT)     PT Frequency: 5x week OT Frequency: 5x week            Contractures Contractures Info: Not present    Additional Factors Info  Code Status, Allergies, Psychotropic, Insulin Sliding Scale Code Status Info:  (Full code) Allergies Info:  (Lidocaine, Trazodone, Latex) Psychotropic Info:  (Cymbalta-see MAR) Insulin Sliding Scale Info:  (SSI-see MAR)       Current Medications (01/06/2020):  This is the current hospital active medication  list Current Facility-Administered Medications  Medication Dose Route Frequency Provider Last Rate Last Admin  . (feeding supplement) PROSource Plus liquid 30 mL  30 mL Oral TID BM Rod Can, MD   30 mL at 01/05/20 1438  . 0.9 %  sodium chloride infusion   Intravenous PRN Wynonia Musty, RN 10 mL/hr at 01/06/20 0845  250 mL at 01/06/20 0845  . alum & mag hydroxide-simeth (MAALOX/MYLANTA) 200-200-20 MG/5ML suspension 30 mL  30 mL Oral Q4H PRN Wofford, Drew A, RPH      . amLODipine (NORVASC) tablet 10 mg  10 mg Oral Daily Caren Griffins, MD   10 mg at 01/06/20 1004  . atorvastatin (LIPITOR) tablet 40 mg  40 mg Oral q1800 Rod Can, MD   40 mg at 01/05/20 1708  . calcium carbonate (TUMS - dosed in mg elemental calcium) chewable tablet 200 mg of elemental calcium  1 tablet Oral TID PRN Lang Snow, FNP   200 mg of elemental calcium at 12/30/19 0834  . carvedilol (COREG) tablet 25 mg  25 mg Oral BID WC Caren Griffins, MD   25 mg at 01/06/20 0748  . ceFAZolin (ANCEF) IVPB 2g/100 mL premix  2 g Intravenous Q12H Susa Raring, RPH 200 mL/hr at 01/06/20 0320 2 g at 01/06/20 0320  . Chlorhexidine Gluconate Cloth 2 % PADS 6 each  6 each Topical Daily Caren Griffins, MD   6 each at 01/05/20 1122  . collagenase (SANTYL) ointment   Topical Daily Evelina Bucy, DPM   Given at 01/06/20 1102  . cyclobenzaprine (FLEXERIL) tablet 10 mg  10 mg Oral BID PRN Rod Can, MD   10 mg at 01/06/20 0541  . dexlansoprazole (DEXILANT) capsule 60 mg  60 mg Oral Daily Raiford Noble Moore, DO   60 mg at 01/06/20 1103  . docusate sodium (COLACE) capsule 100 mg  100 mg Oral BID Rod Can, MD   100 mg at 01/01/20 2204  . DULoxetine (CYMBALTA) DR capsule 60 mg  60 mg Oral Daily Rod Can, MD   60 mg at 01/06/20 1004  . guaiFENesin-dextromethorphan (ROBITUSSIN DM) 100-10 MG/5ML syrup 5 mL  5 mL Oral Q4H PRN Lang Snow, FNP      . heparin injection 5,000 Units  5,000 Units Subcutaneous Q8H Raiford Noble [place], DO   5,000 Units at 01/06/20 1336  . hydrALAZINE (APRESOLINE) tablet 10 mg  10 mg Oral Q6H PRN Caren Griffins, MD   10 mg at 12/31/19 0454  . hydrALAZINE (APRESOLINE) tablet 100 mg  100 mg Oral TID Caren Griffins, MD   100 mg at 01/06/20 1004  . HYDROcodone-acetaminophen (NORCO/VICODIN) 5-325  MG per tablet 1-2 tablet  1-2 tablet Oral Q6H PRN Rod Can, MD   2 tablet at 01/06/20 1411  . insulin aspart (novoLOG) injection 0-5 Units  0-5 Units Subcutaneous QHS Sheikh, Omair Latif, DO      . insulin aspart (novoLOG) injection 0-9 Units  0-9 Units Subcutaneous TID WC Rod Can, MD   2 Units at 01/06/20 1225  . insulin aspart (novoLOG) injection 3 Units  3 Units Subcutaneous TID WC Caren Griffins, MD   3 Units at 01/06/20 1225  . insulin glargine (LANTUS) injection 24 Units  24 Units Subcutaneous QHS Raiford Noble [place], [place]   24 Units at 01/05/20 2132  . lip balm (CARMEX) ointment 1 application  1 application Topical PRN Lang Snow, FNP      . loratadine (CLARITIN) tablet  10 mg  10 mg Oral Daily PRN Lang Snow, FNP      . menthol-cetylpyridinium (CEPACOL) lozenge 3 mg  1 lozenge Oral PRN Swinteck, Aaron Edelman, MD       Or  . phenol (CHLORASEPTIC) mouth spray 1 spray  1 spray Mouth/Throat PRN Swinteck, Aaron Edelman, MD      . metoCLOPramide (REGLAN) tablet 5-10 mg  5-10 mg Oral Q8H PRN Swinteck, Aaron Edelman, MD       Or  . metoCLOPramide (REGLAN) injection 5-10 mg  5-10 mg Intravenous Q8H PRN Swinteck, Aaron Edelman, MD      . morphine 2 MG/ML injection 0.5 mg  0.5 mg Intravenous Q2H PRN Rod Can, MD   0.5 mg at 01/06/20 1018  . multivitamin (RENA-VIT) tablet 1 tablet  1 tablet Oral QHS Rod Can, MD   1 tablet at 01/05/20 2131  . Muscle Rub CREA 1 application  1 application Topical PRN Lang Snow, FNP      . ondansetron [facility]) tablet 4 mg  4 mg Oral Q6H PRN Swinteck, Aaron Edelman, MD       Or  . ondansetron (ZOFRAN) injection 4 mg  4 mg Intravenous Q6H PRN Swinteck, Aaron Edelman, MD      . polyvinyl alcohol (LIQUIFILM TEARS) 1.4 % ophthalmic solution 1 drop  1 drop Both Eyes PRN Lang Snow, FNP      . senna-docusate (Senokot-S) tablet 1 tablet  1 tablet Oral QHS PRN Swinteck, Aaron Edelman, MD      . sodium bicarbonate tablet 1,300 mg  1,300 mg Oral BID Roney Jaffe, MD   1,300 mg at  01/06/20 1004  . sodium chloride (OCEAN) 0.65 % nasal spray 1 spray  1 spray Each Nare PRN Lang Snow, FNP         Discharge Medications: Please see discharge summary for a list of discharge medications.  Relevant Imaging Results:  Relevant Lab Results:   Additional Information [id number], Juliann Pulse, [place]

## 2020-01-06 NOTE — Progress Notes (Signed)
Second failed attempt at MRI. Patient continues to refuse exam.

## 2020-01-06 NOTE — Progress Notes (Signed)
Report called to [facility] and [facility] also called, waiting on transportation.

## 2020-01-06 NOTE — Progress Notes (Signed)
Orthopedic Tech Progress Note Patient Details:  JULIANNE CHAMBERLIN [birth year] [phone] Was called before patient came to [facility] to come an apply an [place] for patient. At the time she was still at [facility].  Patient ID: TANAYIA WAHLQUIST, female   DOB: January 16, 1966, 54 y.o.   MRN: [id number]   Janit Pagan 01/06/2020, 5:19 PM

## 2020-01-06 NOTE — Progress Notes (Signed)
ABI w/ TBI study completed.   Please see CV Proc for preliminary results.   Roseland Braun, RDMS  

## 2020-01-06 NOTE — Progress Notes (Signed)
OT Cancellation Note  Patient Details Name: [name] MRN: [id number] DOB: 07-08-1965   Cancelled Treatment:    Reason Eval/Treat Not Completed: Other (comment). Patient transferring to [facility].  Shalva Rozycki L Fredy Gladu 01/06/2020, 3:56 PM

## 2020-01-06 NOTE — Progress Notes (Signed)
[facility] Progress Note  Subjective: looks more tired today, B/Cr not much changed today. No n/v, no jerking or confusion.   Vitals:   01/05/20 2130 01/06/20 0423 01/06/20 0430 01/06/20 1210  BP: (!) 106/47 (!) 125/55  (!) 131/52  Pulse: 72 63  66  Resp: '20 18  15  ' Temp: 98.8 F (37.1 C) 98.3 F (36.8 C)  98.7 F (37.1 C)  TempSrc: Oral Oral  Oral  SpO2: 93% 95%  95%  Weight:   95.7 kg   Height:        Exam: Gen alert, no distress Throat is moist No jvd or bruits Chest clear bilat to bases, no rales RRR no MRG Abd soft ntnd no mass or ascites +bs Ext no leg or UE edema, L foot amp site w/ open wound, blackening of skin in and around the wound Neuro is alert, Ox 3 , nf    Home meds:  - norvasc 5/ coreg 6.25/ asa / lipitor / hydralazine 50 tid  - insulin lantus 50u/ aspart 10u tid  - oxy IR prn/ cymbalta qd  - dexilant/ flexeril prn/ protonix 40     CXR - no acute disease    Na 134  K 4.7  CO2 16  BUN 67  Cr 6.64  Ca 7.8  Hb 6.3 WBC 17K       Last creat's >>      Aug 2021 - creat 4.9      Dec 23, 2019 - creat 5.2        Assessment/ Plan: 1. AoCKD 5 - b/l creat 4.9- 5.2, eGFR 9- 11 from 8/21- 10/21. Pt f/b Dr Posey Pronto. Creat 6.6 on admit 10/27 here, lowest 5.6 then back up mid 6's. IVF"s started w/o any improvement, will dc.  I think her infection may be affecting renal function. No other suggestions. More sleepy today, may be getting uremic. No acute indication for RRT yet. We should get her over to [facility] in case needs dialysis the next 1-2 days.   2. Met acidosis - will start po bicarb 2 tid 3. Vasc access - will consult [facility] for possible permanent access 4. Anemia ckd - Hb 7.6, was 6.3 on admit > got 2u prbc's, peak Hb 9 but now back in the 7-8's range. Not symptomatic. Check fe stores.  5. 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 WBC rising, may be consulting ID today.  6. L hip fracture - sp ORIF on  10/28 per ortho 7. HTN/ vol  - BP's are stable / normal on her 3 home meds, no vol excess per exam 8. DM - on insulin, per pmd      Rob Malyia Moro 01/06/2020, 1:45 PM   Recent Labs  Lab 01/05/20 0523 01/06/20 0522  K 4.7 3.9  BUN 119* 123*  CREATININE 6.76* 6.64*  CALCIUM 8.0* 7.4*  PHOS 6.0* 5.5*  HGB 7.6* 7.1*   Inpatient medications: . (feeding supplement) PROSource Plus  30 mL Oral TID BM  . amLODipine  10 mg Oral Daily  . atorvastatin  40 mg Oral q1800  . carvedilol  25 mg Oral BID WC  . Chlorhexidine Gluconate Cloth  6 each Topical Daily  . collagenase   Topical Daily  . dexlansoprazole  60 mg Oral Daily  . docusate sodium  100 mg Oral BID  . DULoxetine  60 mg Oral Daily  . heparin injection (subcutaneous)  5,000  Units Subcutaneous Q8H  . hydrALAZINE  100 mg Oral TID  . insulin aspart  0-5 Units Subcutaneous QHS  . insulin aspart  0-9 Units Subcutaneous TID WC  . insulin aspart  3 Units Subcutaneous TID WC  . insulin glargine  24 Units Subcutaneous QHS  . multivitamin  1 tablet Oral QHS  . sodium bicarbonate  1,300 mg Oral BID   . sodium chloride 250 mL (01/06/20 0845)  .  ceFAZolin (ANCEF) IV 2 g (01/06/20 0320)   sodium chloride, alum & mag hydroxide-simeth, calcium carbonate, cyclobenzaprine, guaiFENesin-dextromethorphan, hydrALAZINE, HYDROcodone-acetaminophen, lip balm, loratadine, menthol-cetylpyridinium **OR** phenol, metoCLOPramide **OR** metoCLOPramide (REGLAN) injection, morphine injection, Muscle Rub, ondansetron **OR** ondansetron (ZOFRAN) IV, polyvinyl alcohol, senna-docusate, sodium chloride

## 2020-01-06 NOTE — Progress Notes (Signed)
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  Orthopedic surgery has consulted and she is status post operative repair 10/28.  Nephrology also consulted on admission due to chronic kidney disease and signed off since.  She is awaiting insurance authorization for CIR, ongoing problems are leukocytosis, renal failure, anemia.  Insurance authorization was unfortunately denied for CIR so we will attempt to have the patient go to SNF.  Will call podiatry for further evaluation recommendations given her stump.  Patient adamantly is refusing MRI of her foot.  Discussed with ID who recommends empiric antibiotic treatment with Ancef and obtaining a CRP and a ESR.  We will also consult podiatry for further evaluation and recommendations.  **Interim History She was started on a sodium bicarbonate drip and her acidosis has now improved.  Asked nephrology to come back and reevaluate the patient.  Her WBC continues to worsen and podiatry was consulted for her Left foot. Dr. Price of podiatry feels that the patient would benefit from an MRI for evaluation of the osseous erosion of the anterior border of the calcaneus.  Recommends continuing empiric antibiotic therapy.  Since WBC significantly worsened we have consulted ID for further evaluation and formal consultation.  ID recommended that the patient obtain an MRI of the foot and has made antibiotic recommendations with IV cefazolin.  Vascular surgery evaluated for HD access and she had vein mapping from a month ago.  Vascular surgery recommending transfer to [place] tentatively planning an AV fistula/graft plus a TDC for Monday.   She had an angiogram back in 12/17/2019 and they recommending checking ABI and arterial ultrasound of left lower extremity and this has been ordered.  Patient is agreeable to get an MRI and this will be done at [facility] when she is transferred.  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 Swinteck on 10/28 -PT recommended CIR, patient agreeable, insurance authorization was unfortunately denied and now social work is attempting to look for SNF PT continues recommend rehab and bed offers have been given to the patient but they are awaiting a PASARR  Acute on Chronic Anemia of Chronic Kidney Disease, postoperative acute blood loss anemia -hemoglobin dropped to 6.3, status post 2 units of packed red blood cells.  She was given Lasix x1.  -Hemoglobin dipping down over the last couple of days  -She has been transfused 3 units of PRBCs and repeat hemoglobin/hematocrit the day before yesterday was 8.0/26.0 and yesterday is relatively stable at 7.6/23.8 but today is 7.1/22.6 he will need to continue monitor for further signs and symptoms bleeding -Continue   to monitor for signs and symptoms of bleeding; Currently no overt bleeding -Repeat CBC in the AM   Leukocytosis, worsening with concern for infection -Likely reactive in the setting of hip fracture, however was improving and then her WBC is back up today.   -WBC went from 17.6 ->  28.3 -> 18.6 -> 34.7 -> 21.2 -She has no infectious symptoms currently.  She did have diarrhea a few days ago and does have irritable bowel syndrome.   -Her foot does not look particularly infected but pictures reviewed by ID  -Last month her Foot Cx showed Staph and Strep G,  -Obtained an x-ray which showed "Prior left foot amputation. Soft tissue swelling noted over the stump. Subtle erosive changes about the anterior aspect of the calcaneal stump cannot be excluded. Osteomyelitis cannot be excluded. Diffuse osteopenia and degenerative change." -Dr. Gherghe ordered an MRI however patient refuses stating "I do not think I need this test".  It was noted that an MRI has been ordered as an outpatient also about 2 weeks ago and does not want to have it done inpatient -We obtained an ESR and this was elevated at 74 and a CRP was 20.1 -Ancef started and will continue Ancef at this time and will formally consult ID given her worsening leukocytosis. -Will also consult Podiatry for further evaluation and management; podiatry recommending MRI and feels like that she likely has an osteomyelitis; -I am concerned for some underlying foot infection or an abscess that the patient has and that we cannot evaluate.   -Factious diseases consulted and recommending obtaining MRI and continue IV cefazolin for now -Further clinical decision making in addition of any factors will be based wants patient is amenable to get the MRI and get it done.  Hyponatremia -Patient's sodium has been trending down and trended down to 125 yesterday and today it is 126. -Continue to Monitor and Trend  -Nephrology is following  Acute Kidney Injury on Chronic Kidney Disease Stage V  Metabolic Acidosis Hyperphosphatemia -baseline creatinine around 4, her creatinine was in the six range on admission.   -Nephrology evaluated patient.  Due to lower extremity swelling she was diuresed with Lasix x2, 10/29-30.   -Creatinine has remained  around 6 which may be her new baseline.  She has no uremic symptoms. -BUN/Cr is slowly getting better and BUN/creatinine went from 111/6.87 and has trended down to 123/6.64 today -Started Sodium Bicarbonate Infusion at 75 mL/hr and nephrology recommending continuing for 1-2 more days  -patient's metabolic acidosis is slightly improved as her CO2 is now 20, chloride level is now 93, and anion gap is 13 -Avoid further nephrotoxic medications, contrast dyes, hypotension and renally dose medications -Continue to monitor and trend renal function carefully -Patient's Phos Level is now 1.5 -Await further recommendations from nephrology; nephrology feels that she does not have any urinary symptoms and does not feel that there is indication for dialysis yet; nephrology recommending continue IV fluids for another 1 to 2 days as patient is euvolemic on exam and they were to start the p.o. bicarbonate 1300 mg p.o. 3 times daily -Has seen Vascular Surgery for Access in the past and nephrology consulting vascular surgery for possible access; Dr. Hawken of vascular surgery evaluated and feels that she needs TDC as well as an AV fistula/graft and this is to be done on Monday.  He requested patient be transferred to [place] and obtain lower extremity ABIs and arterial Dopplers -Patient will be transferred to [place] and   nephrology will speak to the patient about initiation of dialysis  Hypomagnesemia -Patient's magnesium level this morning was 1.5 -Replete with IV mag sulfate 1 g again -Continue to monitor and replete as necessary -Repeat magnesium level in a.m.  Volume overload with bilateral lower extremity edema, improved  -improving with 2 doses of Lasix.  Swelling improved, hold further diuresis and now started back on IV fluids with sodium bicarbonate at 75 MLS per hour given her renal function; nephrology recommending discontinuing IV fluids now  Type 2 Diabetes Mellitus -Continue Lantus 20 units  subcu nightly, sensitive NovoLog/scale insulin AC, 3 units of subcu insulin 3 times daily with meals CBGs are stable -CBG (last 3)  Recent Labs    01/05/20 2127 01/06/20 0721 01/06/20 1208  GLUCAP 192* 223* 183*  -Continue monitor blood sugars carefully and adjust insulin regimen as necessary and we have increased her Lantus to 24 units subcu nightly, increased her symptoms of NovoLog sliding scale insulin before meals and added at bedtime coverage and increased 3 units of subcu insulin 3 times daily with meals to 5 units   GERD -She has a PPI but was taking her home Dexilant which has now been sent to the pharmacy -Resume home [place] and stop her PPI here.  Essential Hypertension -required up titration of her antihypertensive regimen for couple days neuro, finally blood pressure better -Continue with carvedilol 25 mg p.o. twice daily, amlodipine 10 mg p.o. daily -Continue to monitor blood pressures per protocol -Last blood pressure was 131/52  Status post Chopart amputation on Left Foot  -See pictures in Dr. Arman Filter note -Asked podiatry to evaluate; Dr. March Rummage feels that the patient has a left foot ulceration with concern for osteomyelitis given her osseous erosion of the anterior border of the calcaneus.  He recommends an MRI for evaluation of this to rule out further abscess formation as well and recommends continuing empiric antibiotic therapy.  She is weightbearing status per orthopedics for her hip.  Dr. Sharen Counter recommends switching to [facility] wet-to-dry dressings and feels that she does have an osteomyelitis despite her foot appearing well without signs of symptoms of infection.;  He does not feel that urgent surgical debridement is necessary at this time and recommend proceeding with local wound care -patient is amenable to getting the MRI this will be ordered  -She will be transferred to [facility] for further evaluation and initiation of dialysis likely.  She can have the MRI done  there and have podiatry follow-up there and she also get arterial studies to evaluate the blood flow.  Vascular surgery is following closely and appreciate their help -ID evaluated and make recommendations as above  Obesity -Complicates overall prognosis and care -Estimated body mass index is 30.27 kg/m as calculated from the following:   Height as of this encounter: 5' 10" (1.778 m).   Weight as of this encounter: 95.7 kg. -Weight Loss and Dietary Counseling given   DVT prophylaxis: Heparin Sq 5000 units q8h Code Status: FULL CODE Family Communication: No family present at bedside  Disposition Plan: Anticipating D/C to SNF but will first be transferred to [facility] for further vascular surgery evaluation for an AV fistula/graft and further work-up and intervention given that she may need initiation of dialysis  Status is: Inpatient  Remains inpatient appropriate because:Unsafe d/c plan, IV treatments appropriate due to intensity of illness or inability to take PO and Inpatient level of care appropriate due to severity of illness   Dispo: The patient is from:  Home              Anticipated d/c is to: SNF              Anticipated d/c date is: 2 days              Patient currently is not medically stable to d/c.  Consultants:   Orthopedic Surgery  Nephrology  Podiatry   Infectious Diseases   Vascular surgery  Procedures: Intramedullary fixation, Left femur by Dr. Swinteck  Antimicrobials:  Anti-infectives (From admission, onward)   Start     Dose/Rate Route Frequency Ordered Stop   01/06/20 0400  ceFAZolin (ANCEF) IVPB 2g/100 mL premix        2 g 200 mL/hr over 30 Minutes Intravenous Every 12 hours 01/05/20 1709     01/05/20 2200  ceFAZolin (ANCEF) IVPB 2g/100 mL premix  Status:  Discontinued        2 g 200 mL/hr over 30 Minutes Intravenous Every 8 hours 01/05/20 1707 01/05/20 1709   01/04/20 1700  ceFAZolin (ANCEF) IVPB 1 g/50 mL premix  Status:  Discontinued        1  g 100 mL/hr over 30 Minutes Intravenous Every 12 hours 01/04/20 1611 01/05/20 1707   12/29/19 1930  ceFAZolin (ANCEF) IVPB 2g/100 mL premix        2 g 200 mL/hr over 30 Minutes Intravenous Every 6 hours 12/29/19 1620 12/30/19 0201   12/29/19 1300  ceFAZolin (ANCEF) IVPB 2g/100 mL premix        2 g 200 mL/hr over 30 Minutes Intravenous On call to O.R. 12/29/19 0113 12/29/19 1325       Subjective: Seen And examined she states states that she does have some pain but is not as bad today and has some.  No chest pain, lightheadedness or dizziness.  I discussed with her about being transferred to [place] and she initially refused but then I discussed with her the importance of this and she was understandable and agreeable.  Also understand that she needs an MRI but was questioning this again despite that her white count went up.  She is states that she is finally agreeable and amenable to it so this will be reordered.  She will be transferred to [place] at the request of the vascular surgeon and nephrologist.  She may need initiation of dialysis soon and I spoke with the nephrologist and he recommended transfer as soon as possible.  Objective: Vitals:   01/05/20 2130 01/06/20 0423 01/06/20 0430 01/06/20 1210  BP: (!) 106/47 (!) 125/55  (!) 131/52  Pulse: 72 63  66  Resp: 20 18  15  Temp: 98.8 F (37.1 C) 98.3 F (36.8 C)  98.7 F (37.1 C)  TempSrc: Oral Oral  Oral  SpO2: 93% 95%  95%  Weight:   95.7 kg   Height:        Intake/Output Summary (Last 24 hours) at 01/06/2020 1327 Last data filed at 01/06/2020 0500 Gross per 24 hour  Intake 1354.56 ml  Output 600 ml  Net 754.56 ml   Filed Weights   12/29/19 1147 01/05/20 1124 01/06/20 0430  Weight: 88.6 kg 95 kg 95.7 kg   Examination: Physical Exam:  Constitutional: WN/WD obese Caucasian female currently in NAD and appears calm but slightly uncomfortable Eyes: Lids and conjunctivae normal, sclerae anicteric  ENMT: External Ears,  Nose appear normal. Grossly normal hearing.  Neck: Appears normal, supple, no cervical masses, normal ROM, no   appreciable thyromegaly neck: No JVD Respiratory: Diminished to auscultation bilaterally with coarse breath sounds, no wheezing, rales, rhonchi or crackles. Normal respiratory effort and patient is not tachypenic. No accessory muscle use.  Unlabored breathing Cardiovascular: RRR, no murmurs / rubs / gallops. S1 and S2 auscultated.  Has 1+ lower extremity edema on the left leg Abdomen: Soft, non-tender, distended secondary body habitus.  Bowel sounds positive.  GU: Deferred. Musculoskeletal: No clubbing / cyanosis of digits/nails.  Has a left Chopart foot amputation and ulceration that was not unwrapped today.  There is an Ace bandage Skin: No rashes, but does have the ulceration on her amputation site. No induration; Warm and dry.  Neurologic: CN 2-12 grossly intact with no focal deficits. Romberg sign and cerebellar reflexes not assessed.  Psychiatric: Normal judgment and insight. Alert and oriented x 3.  She has no behavioral disturbances noted but has just been refusing care intermittently.  Normal mood and appropriate affect.   Data Reviewed: I have personally reviewed following labs and imaging studies  CBC: Recent Labs  Lab 01/02/20 0844 01/03/20 0834 01/04/20 0521 01/05/20 0523 01/06/20 0522  WBC 17.6* 28.3* 18.6* 34.7* 21.2*  NEUTROABS  --   --   --  30.5* 17.9*  HGB 7.5* 7.2* 8.0* 7.6* 7.1*  HCT 24.3* 23.4* 26.0* 23.8* 22.6*  MCV 89.3 90.3 90.6 88.5 89.0  PLT 324 294 275 241 626   Basic Metabolic Panel: Recent Labs  Lab 01/02/20 0844 01/03/20 0834 01/04/20 0521 01/05/20 0523 01/06/20 0522  NA 131* 128* 129* 125* 126*  K 4.8 4.9 4.8 4.7 3.9  CL 105 104 104 96* 93*  CO2 15* 14* 14* 16* 20*  GLUCOSE 171* 158* 251* 352* 248*  BUN 87* 103* 111* 119* 123*  CREATININE 6.17* 6.22* 6.87* 6.76* 6.64*  CALCIUM 7.9* 8.0* 8.1* 8.0* 7.4*  MG  --   --   --  1.5* 1.5*   PHOS  --   --   --  6.0* 5.5*   GFR: Estimated Creatinine Clearance: 12.4 mL/min (A) (by C-G formula based on SCr of 6.64 mg/dL (H)). Liver Function Tests: Recent Labs  Lab 01/05/20 0523 01/06/20 0522  AST 22 31  ALT 12 12  ALKPHOS 216* 223*  BILITOT 1.0 0.9  PROT 6.2* 5.8*  ALBUMIN 2.1* 1.9*   No results for input(s): LIPASE, AMYLASE in the last 168 hours. No results for input(s): AMMONIA in the last 168 hours. Coagulation Profile: No results for input(s): INR, PROTIME in the last 168 hours. Cardiac Enzymes: No results for input(s): CKTOTAL, CKMB, CKMBINDEX, TROPONINI in the last 168 hours. BNP (last 3 results) No results for input(s): PROBNP in the last 8760 hours. HbA1C: No results for input(s): HGBA1C in the last 72 hours. CBG: Recent Labs  Lab 01/05/20 1048 01/05/20 1653 01/05/20 2127 01/06/20 0721 01/06/20 1208  GLUCAP 254* 206* 192* 223* 183*   Lipid Profile: No results for input(s): CHOL, HDL, LDLCALC, TRIG, CHOLHDL, LDLDIRECT in the last 72 hours. Thyroid Function Tests: No results for input(s): TSH, T4TOTAL, FREET4, T3FREE, THYROIDAB in the last 72 hours. Anemia Panel: Recent Labs    01/06/20 0522  TIBC 145*  IRON 15*   Sepsis Labs: No results for input(s): PROCALCITON, LATICACIDVEN in the last 168 hours.  Recent Results (from the past 240 hour(s))  Respiratory Panel by RT PCR (Flu A&B, Covid) - Nasopharyngeal Swab     Status: None   Collection Time: 12/28/19 12:00 PM   Specimen: Nasopharyngeal Swab  Result  Value Ref Range Status   SARS Coronavirus 2 by RT PCR NEGATIVE NEGATIVE Final    Comment: (NOTE) SARS-CoV-2 target nucleic acids are NOT DETECTED.  The SARS-CoV-2 RNA is generally detectable in upper respiratoy specimens during the acute phase of infection. The lowest concentration of SARS-CoV-2 viral copies this assay can detect is 131 copies/mL. A negative result does not preclude SARS-Cov-2 infection and should not be used as the sole  basis for treatment or other patient management decisions. A negative result may occur with  improper specimen collection/handling, submission of specimen other than nasopharyngeal swab, presence of viral mutation(s) within the areas targeted by this assay, and inadequate number of viral copies (<131 copies/mL). A negative result must be combined with clinical observations, patient history, and epidemiological information. The expected result is Negative.  Fact Sheet for Patients:  https://www.fda.gov/media/142436/download  Fact Sheet for Healthcare Providers:  https://www.fda.gov/media/142435/download  This test is no t yet approved or cleared by the [place] FDA and  has been authorized for detection and/or diagnosis of SARS-CoV-2 by FDA under an Emergency Use Authorization (EUA). This EUA will remain  in effect (meaning this test can be used) for the duration of the COVID-19 declaration under Section 564(b)(1) of the Act, 21 U.S.C. section 360bbb-3(b)(1), unless the authorization is terminated or revoked sooner.     Influenza A by PCR NEGATIVE NEGATIVE Final   Influenza B by PCR NEGATIVE NEGATIVE Final    Comment: (NOTE) The Xpert Xpress SARS-CoV-2/FLU/RSV assay is intended as an aid in  the diagnosis of influenza from Nasopharyngeal swab specimens and  should not be used as a sole basis for treatment. Nasal washings and  aspirates are unacceptable for Xpert Xpress SARS-CoV-2/FLU/RSV  testing.  Fact Sheet for Patients: https://www.fda.gov/media/142436/download  Fact Sheet for Healthcare Providers: https://www.fda.gov/media/142435/download  This test is not yet approved or cleared by the [place] FDA and  has been authorized for detection and/or diagnosis of SARS-CoV-2 by  FDA under an Emergency Use Authorization (EUA). This EUA will remain  in effect (meaning this test can be used) for the duration of the  Covid-19 declaration under Section 564(b)(1) of the  Act, 21  U.S.C. section 360bbb-3(b)(1), unless the authorization is  terminated or revoked. Performed at [facility], [address] [place], [place] [address]   Surgical pcr screen     Status: Abnormal   Collection Time: 12/28/19  4:50 PM   Specimen: Nasal Mucosa; Nasal Swab  Result Value Ref Range Status   MRSA, PCR NEGATIVE NEGATIVE Final   Staphylococcus aureus POSITIVE (A) NEGATIVE Final    Comment: (NOTE) The Xpert SA Assay (FDA approved for NASAL specimens in patients 22 years of age and older), is one component of a comprehensive surveillance program. It is not intended to diagnose infection nor to guide or monitor treatment. Performed at [facility], [address] [place], [place] [address]   Culture, blood (routine x 2)     Status: None (Preliminary result)   Collection Time: 01/04/20  5:43 PM   Specimen: BLOOD LEFT HAND  Result Value Ref Range Status   Specimen Description   Final    BLOOD LEFT HAND Performed at [facility], [address] [place], [place] [address]    Special Requests   Final    BOTTLES DRAWN AEROBIC AND ANAEROBIC Blood Culture adequate volume Performed at [facility], [address] [place], [place] [address]    Culture   Final      NO GROWTH 2 DAYS Performed at [facility], [address] [place], [place] [address]    Report Status PENDING  Incomplete  Culture, blood (routine x 2)     Status: None (Preliminary result)   Collection Time: 01/04/20  5:51 PM   Specimen: BLOOD  Result Value Ref Range Status   Specimen Description   Final    BLOOD RIGHT ANTECUBITAL Performed at [facility], [address] [place], [place] [address]    Special Requests   Final    BOTTLES DRAWN AEROBIC ONLY Blood Culture results may not be optimal due to an inadequate volume of blood received in culture bottles Performed at  [facility], [address] [place], [place] [address]    Culture   Final    NO GROWTH 2 DAYS Performed at [facility], [address] , [place] [address]    Report Status PENDING  Incomplete     RN Pressure Injury Documentation:     Estimated body mass index is 30.27 kg/m as calculated from the following:   Height as of this encounter: 5' 10" (1.778 m).   Weight as of this encounter: 95.7 kg.  Malnutrition Type:  Nutrition Problem: Increased nutrient needs Etiology: post-op healing   Malnutrition Characteristics:  Signs/Symptoms: estimated needs   Nutrition Interventions:  Interventions: Other (Comment) (rena-vite, prosource plus)   Radiology Studies: VAS US ABI WITH/WO TBI  Result Date: 01/06/2020 LOWER EXTREMITY DOPPLER STUDY Indications: Ulceration LT ankle. Other Factors: 2015 LT foot amputation. S/P fall 12-28-2019.  Vascular Interventions: 12-17-2018 LT SFA atherectomy and DEB angioplasty. Limitations: Today's exam was limited due to bandages. Comparison Study: 02-08-2019 ABI was 1.01 on the RT and 0.86 on the LT. Performing Technologist: Hodge, Rachel RDMS  Examination Guidelines: A complete evaluation includes at minimum, Doppler waveform signals and systolic blood pressure reading at the level of bilateral brachial, anterior tibial, and posterior tibial arteries, when vessel segments are accessible. Bilateral testing is considered an integral part of a complete examination. Photoelectric Plethysmograph (PPG) waveforms and toe systolic pressure readings are included as required and additional duplex testing as needed. Limited examinations for reoccurring indications may be performed as noted.  ABI Findings: +---------+------------------+-----+---------+--------+ Right    Rt Pressure (mmHg)IndexWaveform Comment  +---------+------------------+-----+---------+--------+ Brachial 130                    triphasic          +---------+------------------+-----+---------+--------+ PTA      118               0.91 triphasic         +---------+------------------+-----+---------+--------+ DP       115               0.88 triphasic         +---------+------------------+-----+---------+--------+ Great Toe41                0.32 Abnormal          +---------+------------------+-----+---------+--------+ +---------+----------------+-----+----------+---------------------------------+ Left     Lt Pressure     IndexWaveform  Comment                                    (mmHg)                                                           +---------+----------------+-----+----------+---------------------------------+   Brachial                                Unable to obtain pressure due to                                          AVF.                              +---------+----------------+-----+----------+---------------------------------+ PTA      99              0.76 biphasic                                    +---------+----------------+-----+----------+---------------------------------+ DP       72              0.55 monophasic                                  +---------+----------------+-----+----------+---------------------------------+ Great Toe                               LT foot amputation.               +---------+----------------+-----+----------+---------------------------------+ +-------+-----------+-----------+------------+------------+ ABI/TBIToday's ABIToday's TBIPrevious ABIPrevious TBI +-------+-----------+-----------+------------+------------+ Right  0.91       0.32       1.01                     +-------+-----------+-----------+------------+------------+ Left   0.76                  0.86                     +-------+-----------+-----------+------------+------------+  Summary: Right: Resting right ankle-brachial index indicates mild right lower extremity arterial  disease. The right toe-brachial index is abnormal. Left: Resting left ankle-brachial index indicates moderate left lower extremity arterial disease.  *See table(s) above for measurements and observations.     Preliminary    Scheduled Meds: . (feeding supplement) PROSource Plus  30 mL Oral TID BM  . amLODipine  10 mg Oral Daily  . atorvastatin  40 mg Oral q1800  . carvedilol  25 mg Oral BID WC  . Chlorhexidine Gluconate Cloth  6 each Topical Daily  . collagenase   Topical Daily  . dexlansoprazole  60 mg Oral Daily  . docusate sodium  100 mg Oral BID  . DULoxetine  60 mg Oral Daily  . heparin injection (subcutaneous)  5,000 Units Subcutaneous Q8H  . hydrALAZINE  100 mg Oral TID  . insulin aspart  0-5 Units Subcutaneous QHS  . insulin aspart  0-9 Units Subcutaneous TID WC  . insulin aspart  3 Units Subcutaneous TID WC  . insulin glargine  24 Units Subcutaneous QHS  . multivitamin  1 tablet Oral QHS  . sodium bicarbonate  1,300 mg Oral BID   Continuous Infusions: . sodium chloride 250 mL (01/06/20 0845)  .  ceFAZolin (ANCEF) IV 2 g (01/06/20 0320)    LOS: 9 days   Kerney Elbe, DO [facility]  PAGER is on AMION  If 7PM-7AM, please contact night-coverage [email or web address]   

## 2020-01-06 NOTE — Progress Notes (Signed)
Patient alert/oriented, no distress noted, patient transported/transfeered to [facility] via [facility].

## 2020-01-07 DIAGNOSIS — D649 Anemia, unspecified: Secondary | ICD-10-CM | POA: Diagnosis not present

## 2020-01-07 DIAGNOSIS — L97524 Non-pressure chronic ulcer of other part of left foot with necrosis of bone: Secondary | ICD-10-CM | POA: Diagnosis not present

## 2020-01-07 DIAGNOSIS — N179 Acute kidney failure, unspecified: Secondary | ICD-10-CM | POA: Diagnosis not present

## 2020-01-07 DIAGNOSIS — T148XXA Other injury of unspecified body region, initial encounter: Secondary | ICD-10-CM | POA: Diagnosis not present

## 2020-01-07 DIAGNOSIS — E1165 Type 2 diabetes mellitus with hyperglycemia: Secondary | ICD-10-CM | POA: Diagnosis not present

## 2020-01-07 DIAGNOSIS — E118 Type 2 diabetes mellitus with unspecified complications: Secondary | ICD-10-CM | POA: Diagnosis not present

## 2020-01-07 DIAGNOSIS — N185 Chronic kidney disease, stage 5: Secondary | ICD-10-CM | POA: Diagnosis not present

## 2020-01-07 DIAGNOSIS — I120 Hypertensive chronic kidney disease with stage 5 chronic kidney disease or end stage renal disease: Secondary | ICD-10-CM | POA: Diagnosis not present

## 2020-01-07 DIAGNOSIS — E1129 Type 2 diabetes mellitus with other diabetic kidney complication: Secondary | ICD-10-CM | POA: Diagnosis not present

## 2020-01-07 DIAGNOSIS — E8770 Fluid overload, unspecified: Secondary | ICD-10-CM | POA: Diagnosis not present

## 2020-01-07 LAB — CBC WITH DIFFERENTIAL/PLATELET
Abs Immature Granulocytes: 0.29 10*3/uL — ABNORMAL HIGH (ref 0.00–0.07)
Basophils Absolute: 0 10*3/uL (ref 0.0–0.1)
Basophils Relative: 0 %
Eosinophils Absolute: 0.2 10*3/uL (ref 0.0–0.5)
Eosinophils Relative: 1 %
HCT: 22.5 % — ABNORMAL LOW (ref 36.0–46.0)
Hemoglobin: 7.3 g/dL — ABNORMAL LOW (ref 12.0–15.0)
Immature Granulocytes: 2 %
Lymphocytes Relative: 7 %
Lymphs Abs: 1.1 10*3/uL (ref 0.7–4.0)
MCH: 28.2 pg (ref 26.0–34.0)
MCHC: 32.4 g/dL (ref 30.0–36.0)
MCV: 86.9 fL (ref 80.0–100.0)
Monocytes Absolute: 1.5 10*3/uL — ABNORMAL HIGH (ref 0.1–1.0)
Monocytes Relative: 9 %
Neutro Abs: 12.5 10*3/uL — ABNORMAL HIGH (ref 1.7–7.7)
Neutrophils Relative %: 81 %
Platelets: 231 10*3/uL (ref 150–400)
RBC: 2.59 MIL/uL — ABNORMAL LOW (ref 3.87–5.11)
RDW: 15 % (ref 11.5–15.5)
WBC: 15.6 10*3/uL — ABNORMAL HIGH (ref 4.0–10.5)
nRBC: 0 % (ref 0.0–0.2)

## 2020-01-07 LAB — COMPREHENSIVE METABOLIC PANEL
ALT: 6 U/L (ref 0–44)
AST: 21 U/L (ref 15–41)
Albumin: 1.7 g/dL — ABNORMAL LOW (ref 3.5–5.0)
Alkaline Phosphatase: 223 U/L — ABNORMAL HIGH (ref 38–126)
Anion gap: 13 (ref 5–15)
BUN: 134 mg/dL — ABNORMAL HIGH (ref 6–20)
CO2: 22 mmol/L (ref 22–32)
Calcium: 7.7 mg/dL — ABNORMAL LOW (ref 8.9–10.3)
Chloride: 92 mmol/L — ABNORMAL LOW (ref 98–111)
Creatinine, Ser: 7.33 mg/dL — ABNORMAL HIGH (ref 0.44–1.00)
GFR, Estimated: 6 mL/min — ABNORMAL LOW (ref >60)
Glucose, Bld: 193 mg/dL — ABNORMAL HIGH (ref 70–99)
Potassium: 4.2 mmol/L (ref 3.5–5.1)
Sodium: 127 mmol/L — ABNORMAL LOW (ref 135–145)
Total Bilirubin: 0.7 mg/dL (ref 0.3–1.2)
Total Protein: 5.8 g/dL — ABNORMAL LOW (ref 6.5–8.1)

## 2020-01-07 LAB — GLUCOSE, CAPILLARY
Glucose-Capillary: 186 mg/dL — ABNORMAL HIGH (ref 70–99)
Glucose-Capillary: 199 mg/dL — ABNORMAL HIGH (ref 70–99)
Glucose-Capillary: 229 mg/dL — ABNORMAL HIGH (ref 70–99)
Glucose-Capillary: 215 mg/dL — ABNORMAL HIGH (ref 70–99)

## 2020-01-07 LAB — PHOSPHORUS: Phosphorus: 5.8 mg/dL — ABNORMAL HIGH (ref 2.5–4.6)

## 2020-01-07 LAB — MAGNESIUM: Magnesium: 1.6 mg/dL — ABNORMAL LOW (ref 1.7–2.4)

## 2020-01-07 LAB — PREPARE RBC (CROSSMATCH)

## 2020-01-07 MED ORDER — SODIUM CHLORIDE 0.9 % IV SOLN
510.0000 mg | Freq: Once | INTRAVENOUS | Status: AC
  Administered 2020-01-07: 510 mg via INTRAVENOUS
  Filled 2020-01-07: qty 17

## 2020-01-07 MED ORDER — SODIUM CHLORIDE 0.9% IV SOLUTION: Freq: Once | INTRAVENOUS | Status: DC

## 2020-01-07 NOTE — Progress Notes (Signed)
Called MRI to verify that pt is still on the list and she is but it will be later before she can have her MRI due to stats. I told MRI that after talking with Dr. Waldron Labs this am, the pt did wish to proceed with the MRI as ordered.

## 2020-01-07 NOTE — Progress Notes (Signed)
PROGRESS NOTE    [name]  ZOX:[id number] DOB: 03-21-1965 DOA: 12/28/2019 PCP: Julian Hy, PA-C    Brief Narrative:   The patient 54 year old female with DM, HTN, CKD 5, status post left foot Chopart amputation 2014 managed by podiatry, left SFA vascularization in 2020, prior CVA who came into the hospital after a ground-level fall resulting in left hip fracture.  Orthopedic surgery has consulted and she is status post operative repair 10/28.  Nephrology also consulted on admission due to chronic kidney disease and signed off since.  She is awaiting insurance authorization for CIR, ongoing problems are leukocytosis, renal failure, anemia.  Insurance authorization was unfortunately denied for CIR so we will attempt to have the patient go to SNF.  Will call podiatry for further evaluation recommendations given her stump.  Patient adamantly is refusing MRI of her foot.  Discussed with ID who recommends empiric antibiotic treatment with Ancef and obtaining a CRP and a ESR.  We will also consult podiatry for further evaluation and recommendations.  **Interim History She was started on a sodium bicarbonate drip and her acidosis has now improved.  Asked nephrology to come back and reevaluate the patient.  Her WBC continues to worsen and podiatry was consulted for her Left foot. Dr. March Rummage of podiatry feels that the patient would benefit from an MRI for evaluation of the osseous erosion of the anterior border of the calcaneus.  Recommends continuing empiric antibiotic therapy.  Since WBC significantly worsened we have consulted ID for further evaluation and formal consultation.  ID recommended that the patient obtain an MRI of the foot and has made antibiotic recommendations with IV cefazolin.  Vascular surgery evaluated for HD access and she had vein mapping from a month ago.  Vascular surgery recommending transfer to [facility] tentatively planning an AV fistula/graft plus a TDC for Monday.   She had an angiogram back in 12/17/2019 and they recommending checking ABI and arterial ultrasound of left lower extremity and this has been ordered.  Patient this morning reports she is agreeable to MRI .   Subjective:  This morning she denies any complaints, reports she is post MRI.  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 Lyla Glassing on 10/28 -PT recommended CIR, patient agreeable, insurance authorization was unfortunately denied and now social work is attempting to look for SNF PT continues recommend rehab and bed offers have been given to the patient but they are awaiting a PASARR  Acute on Chronic Anemia of Chronic Kidney Disease, postoperative acute blood loss anemia -hemoglobin dropped to 6.3, status post 2 units of packed red blood cells.  She was given Lasix x1.  -Globin this morning 7.3, will transfuse 1 unit PRBC. -On IV iron per renal.  Leukocytosis, worsening with concern for infection -Likely reactive in the setting of hip fracture, however was improving and then her WBC is back up today.   -WBC went from  17.6 -> 28.3 -> 18.6 -> 34.7 -> 21.2 -She has no infectious symptoms currently.  She did have diarrhea a few days ago and does have irritable bowel syndrome.   -Her foot does not look particularly infected but pictures reviewed by ID  -Last month her Foot Cx showed Staph and Strep G,  -Initially refusing  MRI, but currently she is agreeable.  Hyponatremia -Management per renal Acute Kidney Injury on Chronic Kidney Disease Stage V  Metabolic Acidosis Hyperphosphatemia -baseline creatinine around 4, her creatinine was in the six range on admission.   -Nephrology evaluated patient.  Due to lower extremity swelling she was diuresed with Lasix x2, 10/29-30.   -Creatinine has remained around 6 which may be her new baseline.  She has no uremic symptoms. -BUN/Cr is slowly getting better and BUN/creatinine went from 111/6.87 and has trended down to 123/6.64 today -Started Sodium Bicarbonate Infusion at 75 mL/hr and nephrology recommending continuing for 1-2 more days  -patient's metabolic acidosis is slightly improved as her CO2 is now 20, chloride level is now 93, and anion gap is 13 -Avoid further nephrotoxic medications, contrast dyes, hypotension and renally dose medications -Continue to monitor and trend renal function carefully -Patient's Phos Level is now 1.5 -Await further recommendations from nephrology; nephrology feels that she does not have any urinary symptoms and does not feel that there is indication for dialysis yet; nephrology recommending continue IV fluids for another 1 to 2 days as patient is euvolemic on exam and they were to start the p.o. bicarbonate 1300 mg p.o. 3 times daily -Has seen Vascular Surgery for Access in the past and nephrology consulting vascular surgery for possible access; Dr. Stanford Breed of vascular surgery evaluated and feels that she needs [facility] as well as an AV fistula/graft and this is to be done on Monday.  He requested patient be transferred to [facility] and obtain lower extremity ABIs and arterial Dopplers -Patient will be transferred to [facility] and nephrology will speak to the patient about initiation of dialysis  Hypomagnesemia -Patient's magnesium level this morning was 1.5 -Replete with IV mag sulfate 1 g again -Continue to monitor and replete as  necessary -Repeat magnesium level in a.m.  Volume overload with bilateral lower extremity edema, improved  -improving with 2 doses of Lasix.  Swelling improved, hold further diuresis and now started back on IV fluids with sodium bicarbonate at 75 MLS per hour given her renal function; nephrology recommending discontinuing IV fluids now  Type 2 Diabetes Mellitus -Continue Lantus 20 units subcu nightly, sensitive NovoLog/scale insulin AC, 3 units of subcu insulin 3 times daily with meals CBGs are stable -CBG (last 3)  Recent Labs    01/06/20 2114 01/07/20 0753 01/07/20 1205  GLUCAP 154* 186* 199*  -Continue monitor blood sugars carefully and adjust insulin regimen as necessary and we have increased her Lantus to 24 units subcu nightly, increased her symptoms of NovoLog sliding scale insulin before meals and added at bedtime coverage and increased 3 units of subcu insulin 3 times daily with meals to 5 units   GERD -She has a PPI but was taking her home Dexilant which has now been sent to the pharmacy -Resume home [place] and stop her PPI here.  Essential Hypertension -required up titration of her antihypertensive regimen for couple days neuro, finally blood pressure better -Continue with carvedilol 25 mg p.o. twice daily, amlodipine 10 mg p.o. 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 Arman Filter note -Asked podiatry to evaluate; Dr. March Rummage feels that the patient has a left foot ulceration with concern for osteomyelitis given her osseous erosion of the anterior border of the calcaneus.  He recommends an MRI for evaluation of this to rule out further abscess formation as well and recommends continuing empiric antibiotic therapy.  She is weightbearing status per orthopedics for her hip.  Dr. Sharen Counter recommends switching to [facility] wet-to-dry dressings and feels that she does have an  osteomyelitis despite her foot appearing well without signs of symptoms of infection.;  He does not feel that urgent surgical debridement is necessary at this time and recommend proceeding with local wound care -patient is amenable to getting the MRI this will be ordered  -She will be transferred to [facility] for further evaluation and initiation of dialysis likely.  She can have the MRI done there and have podiatry follow-up there and she also get arterial studies to evaluate the blood flow.  Vascular surgery is following closely and appreciate their help -ID evaluated and make recommendations as above  Obesity -Complicates overall prognosis and care -Estimated body mass index is 30.27 kg/m as calculated from the following:   Height as of this encounter: '5\' 10"'  (1.778 m).   Weight as of this encounter: 95.7 kg. -Weight Loss and Dietary Counseling given   DVT prophylaxis: Heparin Sq 5000 units q8h Code Status: FULL CODE Family Communication: No family present at bedside  Disposition Plan: Will need SNF placement once medically stable.  Status is: Inpatient  Remains inpatient appropriate because:Unsafe d/c plan, IV treatments appropriate due to intensity of illness or inability to take PO and Inpatient level of care appropriate due to severity of illness   Dispo: The patient is from: Home              Anticipated d/c is to: SNF              Anticipated d/c date is: > 3 days              Patient currently is not medically stable to d/c.  Consultants:   Orthopedic Surgery  Nephrology  Podiatry   Infectious Diseases   Vascular surgery  Procedures: Intramedullary fixation, Left femur by Dr. Lyla Glassing  Antimicrobials:  Anti-infectives (From admission, onward)   Start     Dose/Rate Route Frequency Ordered Stop   01/06/20 0400  ceFAZolin (ANCEF) IVPB 2g/100 mL premix        2 g 200 mL/hr over 30 Minutes Intravenous Every 12 hours 01/05/20 1709     01/05/20 2200  ceFAZolin  (ANCEF) IVPB 2g/100 mL premix  Status:  Discontinued        2 g 200 mL/hr over 30 Minutes Intravenous Every 8 hours 01/05/20 1707 01/05/20 1709   01/04/20 1700  ceFAZolin (ANCEF) IVPB 1 g/50 mL premix  Status:  Discontinued        1 g 100 mL/hr over 30 Minutes Intravenous Every 12 hours 01/04/20 1611 01/05/20 1707   12/29/19 1930  ceFAZolin (ANCEF) IVPB 2g/100 mL premix        2 g 200 mL/hr over 30 Minutes Intravenous Every 6 hours 12/29/19 1620 12/30/19 0201   12/29/19 1300  ceFAZolin (ANCEF) IVPB 2g/100 mL premix        2 g 200 mL/hr over 30 Minutes Intravenous On call to O.R. 12/29/19 0113 12/29/19 1325  Objective: Vitals:   01/06/20 2044 01/07/20 0148 01/07/20 0507 01/07/20 1346  BP: (!) 116/50 (!) 120/50 (!) 134/49 (!) 135/53  Pulse: (!) 59 60 65 66  Resp: '17 17 17 17  ' Temp: 98 F (36.7 C) 98.1 F (36.7 C) 97.7 F (36.5 C) 98.3 F (36.8 C)  TempSrc: Oral Oral  Oral  SpO2: 98% 98% 97% 96%  Weight:      Height:        Intake/Output Summary (Last 24 hours) at 01/07/2020 1441 Last data filed at 01/07/2020 0918 Gross per 24 hour  Intake 829.38 ml  Output 201 ml  Net 628.38 ml   Filed Weights   12/29/19 1147 01/05/20 1124 01/06/20 0430  Weight: 88.6 kg 95 kg 95.7 kg   Examination: Physical Exam:  Awake Alert, Oriented X 3, No new F.N deficits, Normal affect Symmetrical Chest wall movement, Good air movement bilaterally, CTAB RRR,No Gallops,Rubs or new Murmurs, No Parasternal Heave +ve B.Sounds, Abd Soft, No tenderness, No rebound - guarding or rigidity. No Cyanosis, Clubbing or edema, in the right lower extremity, left foot amputation with open wound, blackening of skin and and around the wound.   Data Reviewed: I have personally reviewed following labs and imaging studies  CBC: Recent Labs  Lab 01/03/20 0834 01/04/20 0521 01/05/20 0523 01/06/20 0522 01/07/20 0107  WBC 28.3* 18.6* 34.7* 21.2* 15.6*  NEUTROABS  --   --  30.5* 17.9* 12.5*  HGB 7.2*  8.0* 7.6* 7.1* 7.3*  HCT 23.4* 26.0* 23.8* 22.6* 22.5*  MCV 90.3 90.6 88.5 89.0 86.9  PLT 294 275 241 210 546   Basic Metabolic Panel: Recent Labs  Lab 01/03/20 0834 01/04/20 0521 01/05/20 0523 01/06/20 0522 01/07/20 0107  NA 128* 129* 125* 126* 127*  K 4.9 4.8 4.7 3.9 4.2  CL 104 104 96* 93* 92*  CO2 14* 14* 16* 20* 22  GLUCOSE 158* 251* 352* 248* 193*  BUN 103* 111* 119* 123* 134*  CREATININE 6.22* 6.87* 6.76* 6.64* 7.33*  CALCIUM 8.0* 8.1* 8.0* 7.4* 7.7*  MG  --   --  1.5* 1.5* 1.6*  PHOS  --   --  6.0* 5.5* 5.8*   GFR: Estimated Creatinine Clearance: 11.3 mL/min (A) (by C-G formula based on SCr of 7.33 mg/dL (H)). Liver Function Tests: Recent Labs  Lab 01/05/20 0523 01/06/20 0522 01/07/20 0107  AST '22 31 21  ' ALT '12 12 6  ' ALKPHOS 216* 223* 223*  BILITOT 1.0 0.9 0.7  PROT 6.2* 5.8* 5.8*  ALBUMIN 2.1* 1.9* 1.7*   No results for input(s): LIPASE, AMYLASE in the last 168 hours. No results for input(s): AMMONIA in the last 168 hours. Coagulation Profile: No results for input(s): INR, PROTIME in the last 168 hours. Cardiac Enzymes: No results for input(s): CKTOTAL, CKMB, CKMBINDEX, TROPONINI in the last 168 hours. BNP (last 3 results) No results for input(s): PROBNP in the last 8760 hours. HbA1C: No results for input(s): HGBA1C in the last 72 hours. CBG: Recent Labs  Lab 01/06/20 1208 01/06/20 1715 01/06/20 2114 01/07/20 0753 01/07/20 1205  GLUCAP 183* 151* 154* 186* 199*   Lipid Profile: No results for input(s): CHOL, HDL, LDLCALC, TRIG, CHOLHDL, LDLDIRECT in the last 72 hours. Thyroid Function Tests: No results for input(s): TSH, T4TOTAL, FREET4, T3FREE, THYROIDAB in the last 72 hours. Anemia Panel: Recent Labs    01/06/20 0522  TIBC 145*  IRON 15*   Sepsis Labs: No results for input(s): PROCALCITON, LATICACIDVEN in the last 168 hours.  Recent Results (from the past 240 hour(s))  Surgical pcr screen     Status: Abnormal   Collection Time:  12/28/19  4:50 PM   Specimen: Nasal Mucosa; Nasal Swab  Result Value Ref Range Status   MRSA, PCR NEGATIVE NEGATIVE Final   Staphylococcus aureus POSITIVE (A) NEGATIVE Final    Comment: (NOTE) The Xpert SA Assay (FDA approved for NASAL specimens in patients 52 years of age and older), is one component of a comprehensive surveillance program. It is not intended to diagnose infection nor to guide or monitor treatment. Performed at [facility], [place] [address], [place], [place] [address]   Culture, blood (routine x 2)     Status: None (Preliminary result)   Collection Time: 01/04/20  5:43 PM   Specimen: BLOOD LEFT HAND  Result Value Ref Range Status   Specimen Description   Final    BLOOD LEFT HAND Performed at [facility] [address] [place], [place] [address]    Special Requests   Final    BOTTLES DRAWN AEROBIC AND ANAEROBIC Blood Culture adequate volume Performed at [facility] [address] [place], [place] [address]    Culture   Final    NO GROWTH 3 DAYS Performed at [facility], [place] [address], [place], [place] [address]    Report Status PENDING  Incomplete  Culture, blood (routine x 2)     Status: None (Preliminary result)   Collection Time: 01/04/20  5:51 PM   Specimen: BLOOD  Result Value Ref Range Status   Specimen Description   Final    BLOOD RIGHT ANTECUBITAL Performed at [facility] [address] [place], [place] [address]    Special Requests   Final    BOTTLES DRAWN AEROBIC ONLY Blood Culture results may not be optimal due to an inadequate volume of blood received in culture bottles Performed at [facility] [address] [place], [place] [address]    Culture   Final    NO GROWTH 3 DAYS Performed at [facility], [place] [address], [place], [place] [address]    Report Status PENDING  Incomplete     RN Pressure Injury Documentation:      Estimated body mass index is 30.27 kg/m as calculated from the following:   Height as of this encounter: '5\' 10"'  (1.778 m).   Weight as of this encounter: 95.7 kg.  Malnutrition Type:  Nutrition Problem: Increased nutrient needs Etiology: post-op healing   Malnutrition Characteristics:  Signs/Symptoms: estimated needs   Nutrition Interventions:  Interventions: Other (Comment) (rena-vite, prosource plus)   Radiology Studies: VAS [place] ABI WITH/WO TBI  Result Date: 01/06/2020 LOWER EXTREMITY DOPPLER STUDY Indications: Ulceration LT ankle. Other Factors: 2015 LT foot amputation. S/P fall 12-28-2019.  Vascular Interventions: 12-17-2018 LT SFA atherectomy and DEB angioplasty. Limitations: Today's exam was limited due to bandages. Comparison Study: 02-08-2019 ABI was 1.01 on the RT and 0.86 on the LT. Performing Technologist: Darlin Coco RDMS  Examination Guidelines: A complete evaluation includes at minimum, Doppler waveform signals and systolic blood pressure reading at the level of bilateral brachial, anterior tibial, and posterior tibial arteries, when vessel segments are accessible. Bilateral testing is considered an integral part of a complete examination. Photoelectric Plethysmograph (PPG) waveforms and toe systolic pressure readings are included as required and additional duplex testing as needed. Limited examinations for reoccurring indications may be performed as noted.  ABI Findings: +---------+------------------+-----+---------+--------+ Right  Rt Pressure (mmHg)IndexWaveform Comment  +---------+------------------+-----+---------+--------+ Brachial 130                    triphasic         +---------+------------------+-----+---------+--------+ PTA      118               0.91 triphasic         +---------+------------------+-----+---------+--------+ DP       115               0.88 triphasic         +---------+------------------+-----+---------+--------+ Great  Toe41                0.32 Abnormal          +---------+------------------+-----+---------+--------+ +---------+----------------+-----+----------+---------------------------------+ Left     Lt Pressure     IndexWaveform  Comment                                    (mmHg)                                                           +---------+----------------+-----+----------+---------------------------------+ Brachial                                Unable to obtain pressure due to                                          AVF.                              +---------+----------------+-----+----------+---------------------------------+ PTA      99              0.76 biphasic                                    +---------+----------------+-----+----------+---------------------------------+ DP       72              0.55 monophasic                                  +---------+----------------+-----+----------+---------------------------------+ Great Toe                               LT foot amputation.               +---------+----------------+-----+----------+---------------------------------+ +-------+-----------+-----------+------------+------------+ ABI/TBIToday's ABIToday's TBIPrevious ABIPrevious TBI +-------+-----------+-----------+------------+------------+ Right  0.91       0.32       1.01                     +-------+-----------+-----------+------------+------------+ Left   0.76                  0.86                     +-------+-----------+-----------+------------+------------+  Summary: Right: Resting right ankle-brachial index indicates mild right lower extremity arterial disease. The right toe-brachial index is abnormal. Left: Resting left ankle-brachial index indicates moderate left lower extremity arterial disease.  *See table(s) above for measurements and observations.  Electronically signed by Servando Snare MD on 01/06/2020 at 4:05:31 PM.    Final     Scheduled Meds: . (feeding supplement) PROSource Plus  30 mL Oral TID BM  . sodium chloride   Intravenous Once  . amLODipine  10 mg Oral Daily  . atorvastatin  40 mg Oral q1800  . carvedilol  25 mg Oral BID WC  . collagenase   Topical Daily  . dexlansoprazole  60 mg Oral Daily  . docusate sodium  100 mg Oral BID  . DULoxetine  60 mg Oral Daily  . heparin injection (subcutaneous)  5,000 Units Subcutaneous Q8H  . hydrALAZINE  100 mg Oral TID  . insulin aspart  0-5 Units Subcutaneous QHS  . insulin aspart  0-9 Units Subcutaneous TID WC  . insulin aspart  3 Units Subcutaneous TID WC  . insulin glargine  24 Units Subcutaneous QHS  . multivitamin  1 tablet Oral QHS  . sodium bicarbonate  1,300 mg Oral BID   Continuous Infusions: . sodium chloride 10 mL/hr at 01/07/20 0600  .  ceFAZolin (ANCEF) IV Stopped (01/07/20 0531)  . ferumoxytol      LOS: 10 days   Phillips Climes, MD [facility] PAGER is on [name]  If 7PM-7AM, please contact night-coverage [email or web address]

## 2020-01-07 NOTE — Progress Notes (Signed)
[facility] Progress Note  Subjective: BUN/ Cr up 134/ 7.33, UOP 600 yesterday.    Vitals:   01/06/20 1541 01/06/20 2044 01/07/20 0148 01/07/20 0507  BP: (!) 122/50 (!) 116/50 (!) 120/50 (!) 134/49  Pulse: 61 (!) 59 60 65  Resp: '18 17 17 17  ' Temp: 98.2 F (36.8 C) 98 F (36.7 C) 98.1 F (36.7 C) 97.7 F (36.5 C)  TempSrc: Oral Oral Oral   SpO2: 96% 98% 98% 97%  Weight:      Height:        Exam: Gen alert, no distress Throat is moist No jvd or bruits Chest clear bilat to bases, no rales RRR no MRG Abd soft ntnd no mass or ascites +bs Ext no leg or UE edema, L foot amp site w/ open wound, blackening of skin in and around the wound Neuro is alert, Ox 3 , nf    Home meds:  - norvasc 5/ coreg 6.25/ asa / lipitor / hydralazine 50 tid  - insulin lantus 50u/ aspart 10u tid  - oxy IR prn/ cymbalta qd  - dexilant/ flexeril prn/ protonix 40     CXR - no acute disease     Last creat's outpatient >>      Aug 2021 - creat 4.9      Dec 23, 2019 - creat 5.2        Assessment/ Plan: 1. AoCKD 5 - b/l creat 4.9- 5.2, eGFR 9- 11 from 8/21- 10/21. Pt f/b Dr Posey Pronto. Creat 6.6 on admit 10/27 here, imrpoved to 5.6 then back up mid 6's and now 7's. IVF's started w/o improvement, dc'd. Suspect infection contributing. Not grossly uremic, alert and Ox 3. Will need HD soon if continues to worsen. Pt is aware and questions answered. Have d/w [facility] re: possible need for [facility] soon.    2. L hip fracture - admission diagnosis. sp ORIF on 10/28 per orthoMet acidosis - better, cont po bicarb tabs 2 bid 3. Vasc access - consulted [facility] for access, appreciate assist 4. Anemia ckd - Hb 7.6, was 6.3 on admit > got 2u prbc's, peak Hb 9 but now back in the 7-8's range. Not symptomatic. Tsat low 10%. Will consult pharm for IV fe rec.  5. 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 Seen by ID, ancef dose adjusted, needs MRI but pt refusing 6. HTN/ vol  -  BP's are stable / normal on her 3 home meds, no vol excess per exam 7. DM - on insulin, per pmd      Rob Warren 01/07/2020, 9:10 AM   Recent Labs  Lab 01/06/20 0522 01/07/20 0107  K 3.9 4.2  BUN 123* 134*  CREATININE 6.64* 7.33*  CALCIUM 7.4* 7.7*  PHOS 5.5* 5.8*  HGB 7.1* 7.3*   Inpatient medications: . (feeding supplement) PROSource Plus  30 mL Oral TID BM  . sodium chloride   Intravenous Once  . amLODipine  10 mg Oral Daily  . atorvastatin  40 mg Oral q1800  . carvedilol  25 mg Oral BID WC  . collagenase   Topical Daily  . dexlansoprazole  60 mg Oral Daily  . docusate sodium  100 mg Oral BID  . DULoxetine  60 mg Oral Daily  . heparin injection (subcutaneous)  5,000 Units Subcutaneous Q8H  . hydrALAZINE  100 mg Oral TID  . insulin aspart  0-5 Units Subcutaneous QHS  . insulin aspart  0-9 Units Subcutaneous TID WC  . insulin aspart  3 Units Subcutaneous TID WC  . insulin glargine  24 Units Subcutaneous QHS  . multivitamin  1 tablet Oral QHS  . sodium bicarbonate  1,300 mg Oral BID   . sodium chloride 10 mL/hr at 01/07/20 0600  .  ceFAZolin (ANCEF) IV Stopped (01/07/20 0531)   sodium chloride, alum & mag hydroxide-simeth, calcium carbonate, cyclobenzaprine, guaiFENesin-dextromethorphan, hydrALAZINE, HYDROcodone-acetaminophen, lip balm, loratadine, menthol-cetylpyridinium **OR** phenol, metoCLOPramide **OR** metoCLOPramide (REGLAN) injection, morphine injection, Muscle Rub, ondansetron **OR** ondansetron (ZOFRAN) IV, polyvinyl alcohol, senna-docusate, sodium chloride

## 2020-01-07 NOTE — TOC Initial Note (Signed)
Transition of Care [facility]) - Initial/Assessment Note    Patient Details  Name: [name] MRN: [id number] Date of Birth: November 14, 1965  Transition [facility]) CM/SW Contact:    Bary Castilla, LCSW Phone Number: [phone] 01/07/2020, 2:27 PM  Clinical Narrative:                  CSW spoke with patient in to obtain bed choice. Patient explained that she was given the choice and at this point is not willing to accept any of them. CSW reviewed the bed choice to ensure that she had all of them. Bed choices were confirmed. Patient stated that currently she would prefer to go back home unless so more bed offers come through.  TOC team will continue to assist with discharge planning needs.  Expected Discharge Plan: Skilled Nursing Facility Barriers to Discharge: Continued Medical Work up   Patient Goals and CMS Choice Patient states their goals for this hospitalization and ongoing recovery are:: go home CMS Medicare.gov Compare Post Acute Care list provided to:: Patient Choice offered to / list presented to : Patient  Expected Discharge Plan and Services Expected Discharge Plan: [place]   Discharge Planning Services: CM Consult Post Acute Care Choice: IP Rehab Living arrangements for the past 2 months: Single Family Home                                      Prior Living Arrangements/Services Living arrangements for the past 2 months: Single Family Home Lives with:: Self Patient language and need for interpreter reviewed:: Yes Do you feel safe going back to the place where you live?: Yes      Need for Family Participation in Patient Care: No (Comment) Care giver support system in place?: Yes (comment)   Criminal Activity/Legal Involvement Pertinent to Current Situation/Hospitalization: No - Comment as needed  Activities of Daily Living Home Assistive Devices/Equipment: CBG Meter, Other (Comment), Walker (specify type), Bedside commode/3-in-1,  Shower chair without back (walk-in shower, tub/shower unit, toilet riser, front wheeled walker) ADL Screening (condition at time of admission) Patient's cognitive ability adequate to safely complete daily activities?: Yes Is the patient deaf or have difficulty hearing?: No Does the patient have difficulty seeing, even when wearing glasses/contacts?: No Does the patient have difficulty concentrating, remembering, or making decisions?: No Patient able to express need for assistance with ADLs?: Yes Does the patient have difficulty dressing or bathing?: Yes Independently performs ADLs?: No Communication: Independent Dressing (OT): Needs assistance Is this a change from baseline?: Change from baseline, expected to last >3 days Grooming: Needs assistance Is this a change from baseline?: Change from baseline, expected to last >3 days Feeding: Needs assistance Is this a change from baseline?: Change from baseline, expected to last >3 days Bathing: Needs assistance Is this a change from baseline?: Change from baseline, expected to last >3 days Toileting: Dependent Is this a change from baseline?: Change from baseline, expected to last >3days In/Out Bed: Dependent Is this a change from baseline?: Change from baseline, expected to last >3 days Walks in Home: Dependent Is this a change from baseline?: Change from baseline, expected to last >3 days Does the patient have difficulty walking or climbing stairs?: Yes (left foot amputation) Weakness of Legs: Left Weakness of Arms/Hands: None  Permission Sought/Granted Permission sought to share information with : Case Manager Permission granted to share information with :  Yes, Verbal Permission Granted  Share Information with NAME: Case manager     Permission granted to share info w Relationship: Kaitlyn(dtr) [phone]     Emotional Assessment Appearance:: Appears stated age Attitude/Demeanor/Rapport: Gracious Affect (typically observed):  Accepting Orientation: : Oriented to Self, Oriented to Place, Oriented to  Time, Oriented to Situation Alcohol / Substance Use: Not Applicable Psych Involvement: No (comment)  Admission diagnosis:  Fracture [T14.8XXA] Closed fracture of left hip, initial encounter ([place]) [S72.002A] Fall, initial encounter [W19.XXXA] Anemia, unspecified type [D64.9] Stage 5 chronic kidney disease not on chronic dialysis [facility]) [N18.5] Patient Active Problem List   Diagnosis Date Noted  . Chronic osteomyelitis involving left ankle and foot ([place])   . Ulcer of left foot with necrosis of bone ([place])   . Fracture 12/28/2019  . AKI (acute kidney injury) ([place]) 12/28/2019  . Volume overload 12/28/2019  . Acute on chronic anemia 12/28/2019  . CKD (chronic kidney disease) stage 5, GFR less than 15 ml/min (HCC) 12/06/2019  . History of Chopart amputation of left foot ([place]) 12/25/2017  . Cerebral thrombosis with cerebral infarction 04/08/2017  . Acute right-sided weakness 04/07/2017  . Slurred speech 04/07/2017  . Right sided weakness 04/07/2017  . Hyperhidrosis 09/01/2016  . Migraine with aura and without status migrainosus, not intractable 07/28/2016  . 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[place]) 08/04/2014  . CKD stage 3 due to type 2 diabetes mellitus ([place]) 06/27/2014  . Vitamin D insufficiency 05/08/2014  . Obesity (BMI 30.0-34.9) 05/08/2014  . Unilateral amputation of left foot ([place]) 05/08/2014  . Bursitis of left shoulder 02/14/2014  . Neck pain 01/03/2014  . Diabetes mellitus type 2, uncontrolled, with complications ([place]) 79/48/0165  . 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.23) 01/14/2013  . Insomnia 08/04/2012  . Diabetic neuropathy, painful ([place]) 08/04/2011  . Anxiety and depression 05/16/2010  . Essential hypertension, benign 11/01/2007  . Female stress incontinence 11/01/2007  . TOBACCO DEPENDENCE 04/30/2006  . GASTROESOPHAGEAL REFLUX, NO ESOPHAGITIS  04/30/2006  . Irritable bowel syndrome 04/30/2006   PCP:  Julian Hy, PA-C Pharmacy:   [facility], [place] [address] Phone: [phone] Fax: [phone]  [id number] - [place], [place] AT [address] [place] [address] Phone: [phone] Fax: [phone]     Social Determinants of Health (SDOH) Interventions    Readmission Risk Interventions No flowsheet data found.

## 2020-01-07 NOTE — Progress Notes (Signed)
IRON IN ESRD CONSULT NOTE  Pharmacy Consult for Iron Dosing  Labs: Recent Labs    01/05/20 0523 01/05/20 0523 01/06/20 0522 01/07/20 0107  HGB 7.6*   < > 7.1* 7.3*  HCT 23.8*  --  22.6* 22.5*  PLT 241  --  210 231  CREATININE 6.76*  --  6.64* 7.33*   < > = values in this interval not displayed.   Medications:  Scheduled:  . (feeding supplement) PROSource Plus  30 mL Oral TID BM  . sodium chloride   Intravenous Once  . amLODipine  10 mg Oral Daily  . atorvastatin  40 mg Oral q1800  . carvedilol  25 mg Oral BID WC  . collagenase   Topical Daily  . dexlansoprazole  60 mg Oral Daily  . docusate sodium  100 mg Oral BID  . DULoxetine  60 mg Oral Daily  . heparin injection (subcutaneous)  5,000 Units Subcutaneous Q8H  . hydrALAZINE  100 mg Oral TID  . insulin aspart  0-5 Units Subcutaneous QHS  . insulin aspart  0-9 Units Subcutaneous TID WC  . insulin aspart  3 Units Subcutaneous TID WC  . insulin glargine  24 Units Subcutaneous QHS  . multivitamin  1 tablet Oral QHS  . sodium bicarbonate  1,300 mg Oral BID    Assessment: Pharmacy was consulted by nephrology for iron dosing recommendations in patient with ESRD - potential HD candidate next week pending AVF/TDC placement with vascular team. Anemia of chronic disease - Hb steady ~7. Iron studies demonstrated iron level of 15 and TSAT 10%.  Recommend Feraheme 510mg  now and consider another dose in one week if patient remains admitted. Could also consider as soon as 3 days. No concerns with HD if started next week. Of note, patient with wound infection with possible deeper involvement. However, no signs of sepsis at this time, so appropriate for iron administration.    Plan:  Administer Feraheme 510mg  x1  Thank you for allowing pharmacy to be a part of this patient's care.  Alfonse Spruce, PharmD PGY2 ID Pharmacy Resident Phone between 7 am - 3:30 pm: [phone]  Please check AMION for all Progress phone numbers After 10:00  PM, call [place] [phone]   01/07/2020 1:36 PM

## 2020-01-08 ENCOUNTER — Inpatient Hospital Stay (HOSPITAL_COMMUNITY): Payer: Medicaid Other

## 2020-01-08 DIAGNOSIS — T148XXA Other injury of unspecified body region, initial encounter: Secondary | ICD-10-CM | POA: Diagnosis not present

## 2020-01-08 DIAGNOSIS — I120 Hypertensive chronic kidney disease with stage 5 chronic kidney disease or end stage renal disease: Secondary | ICD-10-CM | POA: Diagnosis not present

## 2020-01-08 DIAGNOSIS — L97524 Non-pressure chronic ulcer of other part of left foot with necrosis of bone: Secondary | ICD-10-CM | POA: Diagnosis not present

## 2020-01-08 DIAGNOSIS — E1129 Type 2 diabetes mellitus with other diabetic kidney complication: Secondary | ICD-10-CM | POA: Diagnosis not present

## 2020-01-08 DIAGNOSIS — D649 Anemia, unspecified: Secondary | ICD-10-CM | POA: Diagnosis not present

## 2020-01-08 DIAGNOSIS — E8770 Fluid overload, unspecified: Secondary | ICD-10-CM | POA: Diagnosis not present

## 2020-01-08 DIAGNOSIS — E1165 Type 2 diabetes mellitus with hyperglycemia: Secondary | ICD-10-CM | POA: Diagnosis not present

## 2020-01-08 DIAGNOSIS — N179 Acute kidney failure, unspecified: Secondary | ICD-10-CM | POA: Diagnosis not present

## 2020-01-08 DIAGNOSIS — N185 Chronic kidney disease, stage 5: Secondary | ICD-10-CM | POA: Diagnosis not present

## 2020-01-08 DIAGNOSIS — E118 Type 2 diabetes mellitus with unspecified complications: Secondary | ICD-10-CM | POA: Diagnosis not present

## 2020-01-08 LAB — CBC
HCT: 25.9 % — ABNORMAL LOW (ref 36.0–46.0)
Hemoglobin: 8.6 g/dL — ABNORMAL LOW (ref 12.0–15.0)
MCH: 28.5 pg (ref 26.0–34.0)
MCHC: 33.2 g/dL (ref 30.0–36.0)
MCV: 85.8 fL (ref 80.0–100.0)
Platelets: 266 10*3/uL (ref 150–400)
RBC: 3.02 MIL/uL — ABNORMAL LOW (ref 3.87–5.11)
RDW: 14.9 % (ref 11.5–15.5)
WBC: 18.9 10*3/uL — ABNORMAL HIGH (ref 4.0–10.5)
nRBC: 0 % (ref 0.0–0.2)

## 2020-01-08 LAB — TYPE AND SCREEN
ABO/RH(D): A POS
Antibody Screen: NEGATIVE
Unit division: 0

## 2020-01-08 LAB — BPAM RBC
Blood Product Expiration Date: 202111272359
ISSUE DATE / TIME: 202111061502
Unit Type and Rh: 6200

## 2020-01-08 LAB — RENAL FUNCTION PANEL
Albumin: 1.7 g/dL — ABNORMAL LOW (ref 3.5–5.0)
Anion gap: 15 (ref 5–15)
BUN: 143 mg/dL — ABNORMAL HIGH (ref 6–20)
CO2: 19 mmol/L — ABNORMAL LOW (ref 22–32)
Calcium: 8.2 mg/dL — ABNORMAL LOW (ref 8.9–10.3)
Chloride: 92 mmol/L — ABNORMAL LOW (ref 98–111)
Creatinine, Ser: 7.55 mg/dL — ABNORMAL HIGH (ref 0.44–1.00)
GFR, Estimated: 6 mL/min — ABNORMAL LOW (ref >60)
Glucose, Bld: 223 mg/dL — ABNORMAL HIGH (ref 70–99)
Phosphorus: 6.4 mg/dL — ABNORMAL HIGH (ref 2.5–4.6)
Potassium: 4.3 mmol/L (ref 3.5–5.1)
Sodium: 126 mmol/L — ABNORMAL LOW (ref 135–145)

## 2020-01-08 LAB — GLUCOSE, CAPILLARY
Glucose-Capillary: 189 mg/dL — ABNORMAL HIGH (ref 70–99)
Glucose-Capillary: 180 mg/dL — ABNORMAL HIGH (ref 70–99)
Glucose-Capillary: 267 mg/dL — ABNORMAL HIGH (ref 70–99)
Glucose-Capillary: 224 mg/dL — ABNORMAL HIGH (ref 70–99)

## 2020-01-08 MED ORDER — LORAZEPAM 2 MG/ML IJ SOLN
0.5000 mg | Freq: Once | INTRAMUSCULAR | Status: AC
  Administered 2020-01-08: 0.5 mg via INTRAVENOUS

## 2020-01-08 MED ORDER — CHLORHEXIDINE GLUCONATE CLOTH 2 % EX PADS
6.0000 | MEDICATED_PAD | Freq: Every day | CUTANEOUS | Status: DC
  Administered 2020-01-08 – 2020-01-19 (×10): 6 via TOPICAL

## 2020-01-08 MED ORDER — INSULIN ASPART 100 UNIT/ML ~~LOC~~ SOLN
5.0000 [IU] | Freq: Three times a day (TID) | SUBCUTANEOUS | Status: DC
  Administered 2020-01-08 – 2020-01-11 (×5): 5 [IU] via SUBCUTANEOUS

## 2020-01-08 MED ORDER — LORAZEPAM 2 MG/ML IJ SOLN
INTRAMUSCULAR | Status: AC
  Filled 2020-01-08: qty 1

## 2020-01-08 MED ORDER — INSULIN GLARGINE 100 UNIT/ML ~~LOC~~ SOLN
26.0000 [IU] | Freq: Every day | SUBCUTANEOUS | Status: DC
  Administered 2020-01-08 – 2020-01-09 (×2): 26 [IU] via SUBCUTANEOUS
  Filled 2020-01-08 (×4): qty 0.26

## 2020-01-08 NOTE — Progress Notes (Signed)
Ativan 0.5 given IV for MRI per order from Dr. Waldron Labs.

## 2020-01-08 NOTE — Progress Notes (Signed)
PROGRESS NOTE    [name]  BDZ:[id number] DOB: [birth year] DOA: 12/28/2019 PCP: Julian Hy, PA-C    Brief Narrative:   The patient 54 year old female with DM, HTN, CKD 5, status post left foot Chopart amputation 2014 managed by podiatry, left SFA revascularization in 2020, prior CVA who came into the hospital after a ground-level fall resulting in left hip fracture.  Orthopedic surgery has consulted and she is status post operative repair 10/28.    Will her work-up was significant for worsening creatinine, followed by renal with consideration to initiate hemodialysis this admission, he does have leukocytosis, with concern about infection at left foot transmetatarsal amputation site.  Subjective:  He denies any complaints today.  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 Lyla Glassing on 10/28 -PT recommended CIR, patient agreeable, insurance authorization was unfortunately denied and now social work is attempting to look for SNF PT continues recommend rehab and bed offers have been given to the patient but they are awaiting a PASARR  Acute on Chronic Anemia of Chronic Kidney Disease, postoperative acute blood loss anemia -Transfuse  as needed, so far received 3 units during hospital stay. -On IV iron per renal.  Leukocytosis, worsening with concern for infection/calcaneal osteomyelitis -MRI of left ankle  is pending, management per ID, she is currently on IV cefazolin, possible need for debridement versus prolonged antibiotic course. -She is being followed by podiatry, vascular and ID.  Hyponatremia -Management per renal  Acute Kidney Injury on Chronic Kidney Disease Stage V  -Management per renal, follow on labs this morning, likely will need to initiate hemodialysis per renal this admission. -Plan for [facility] and permanent access tomorrow by Dr. Donzetta Matters.  Hypomagnesemia -Pleat as needed  Volume overload with bilateral lower extremity edema, improved  -improving with 2 doses of Lasix.  Swelling improved, hold further diuresis and now started back on IV fluids with sodium bicarbonate at 75 MLS per hour given her renal function; nephrology recommending discontinuing IV fluids now  Type 2 Diabetes Mellitus -Medication has been adjusted, increase Lantus to 26 units every afternoon, continue with NovoLog at 5 units before meals.  Continue with insulin sliding scale.    -CBG (last 3)  Recent Labs    01/07/20 2122 01/08/20 0748 01/08/20 1237  GLUCAP 215* 189* 180*    GERD -She has a PPI but was taking her home Dexilant which has now been sent to the pharmacy -Resume home [place] and stop her PPI here.  Essential Hypertension -required up titration of her antihypertensive regimen for couple days neuro, finally blood pressure better -Continue with carvedilol 25 mg p.o. twice daily, amlodipine 10 mg p.o. daily -Continue to monitor blood pressures per protocol -Last blood pressure was 341/96  Obesity -Complicates overall prognosis and care -Estimated body mass index is 30.27 kg/m as calculated from the following:   Height as of this encounter: 5\' 10"  (1.778 m).   Weight as  of this encounter: 95.7 kg. -Weight Loss and Dietary Counseling given   DVT prophylaxis: Heparin Sq 5000 units q8h Code Status: FULL CODE Family Communication: No family present at bedside  Disposition Plan: Will  need SNF placement once medically stable.  Status is: Inpatient  Remains inpatient appropriate because:Unsafe d/c plan, IV treatments appropriate due to intensity of illness or inability to take PO and Inpatient level of care appropriate due to severity of illness   Dispo: The patient is from: Home              Anticipated d/c is to: SNF              Anticipated d/c date is: > 3 days              Patient currently is not medically stable to d/c.  Consultants:   Orthopedic Surgery  Nephrology  Podiatry   Infectious Diseases   Vascular surgery  Procedures: Intramedullary fixation, Left femur by Dr. Lyla Glassing  Antimicrobials:  Anti-infectives (From admission, onward)   Start     Dose/Rate Route Frequency Ordered Stop   01/06/20 0400  ceFAZolin (ANCEF) IVPB 2g/100 mL premix        2 g 200 mL/hr over 30 Minutes Intravenous Every 12 hours 01/05/20 1709     01/05/20 2200  ceFAZolin (ANCEF) IVPB 2g/100 mL premix  Status:  Discontinued        2 g 200 mL/hr over 30 Minutes Intravenous Every 8 hours 01/05/20 1707 01/05/20 1709   01/04/20 1700  ceFAZolin (ANCEF) IVPB 1 g/50 mL premix  Status:  Discontinued        1 g 100 mL/hr over 30 Minutes Intravenous Every 12 hours 01/04/20 1611 01/05/20 1707   12/29/19 1930  ceFAZolin (ANCEF) IVPB 2g/100 mL premix        2 g 200 mL/hr over 30 Minutes Intravenous Every 6 hours 12/29/19 1620 12/30/19 0201   12/29/19 1300  ceFAZolin (ANCEF) IVPB 2g/100 mL premix        2 g 200 mL/hr over 30 Minutes Intravenous On call to O.R. 12/29/19 0113 12/29/19 1325        Objective: Vitals:   01/07/20 1525 01/07/20 1824 01/07/20 1939 01/08/20 0313  BP: (!) 136/54 (!) 136/49 (!) 126/53 (!) 139/54  Pulse: 66 62 62 62  Resp: 20 18  17   Temp: 98.4 F (36.9 C) 99.3 F (37.4 C) 98.5 F (36.9 C) 97.7 F (36.5 C)  TempSrc: Oral Oral Oral   SpO2: 95% 95% 96% 95%  Weight:      Height:        Intake/Output Summary (Last 24 hours) at 01/08/2020  1244 Last data filed at 01/08/2020 0358 Gross per 24 hour  Intake 1052.92 ml  Output 900 ml  Net 152.92 ml   Filed Weights   12/29/19 1147 01/05/20 1124 01/06/20 0430  Weight: 88.6 kg 95 kg 95.7 kg   Examination: Physical Exam:  Awake Alert, Oriented X 3, No new F.N deficits, Normal affect Symmetrical Chest wall movement, Good air movement bilaterally, CTAB RRR,No Gallops,Rubs or new Murmurs, No Parasternal Heave +ve B.Sounds, Abd Soft, No tenderness, No rebound - guarding or rigidity. No Cyanosis, Clubbing, in the right lower extremity, left lower extremity bandaged.  Data Reviewed: I have personally reviewed following labs and imaging studies  CBC: Recent Labs  Lab 01/03/20 0834 01/04/20 0521 01/05/20 0523 01/06/20 0522 01/07/20 0107  WBC 28.3* 18.6* 34.7* 21.2* 15.6*  NEUTROABS  --   --  30.5* 17.9* 12.5*  HGB 7.2* 8.0* 7.6* 7.1* 7.3*  HCT 23.4* 26.0* 23.8* 22.6* 22.5*  MCV 90.3 90.6 88.5 89.0 86.9  PLT 294 275 241 210 073   Basic Metabolic Panel: Recent Labs  Lab 01/03/20 0834 01/04/20 0521 01/05/20 0523 01/06/20 0522 01/07/20 0107  NA 128* 129* 125* 126* 127*  K 4.9 4.8 4.7 3.9 4.2  CL 104 104 96* 93* 92*  CO2 14* 14* 16* 20* 22  GLUCOSE 158* 251* 352* 248* 193*  BUN 103* 111* 119* 123* 134*  CREATININE 6.22* 6.87* 6.76* 6.64* 7.33*  CALCIUM 8.0* 8.1* 8.0* 7.4* 7.7*  MG  --   --  1.5* 1.5* 1.6*  PHOS  --   --  6.0* 5.5* 5.8*   GFR: Estimated Creatinine Clearance: 11.3 mL/min (A) (by C-G formula based on SCr of 7.33 mg/dL (H)). Liver Function Tests: Recent Labs  Lab 01/05/20 0523 01/06/20 0522 01/07/20 0107  AST 22 31 21   ALT 12 12 6   ALKPHOS 216* 223* 223*  BILITOT 1.0 0.9 0.7  PROT 6.2* 5.8* 5.8*  ALBUMIN 2.1* 1.9* 1.7*   No results for input(s): LIPASE, AMYLASE in the last 168 hours. No results for input(s): AMMONIA in the last 168 hours. Coagulation Profile: No results for input(s): INR, PROTIME in the last 168 hours. Cardiac  Enzymes: No results for input(s): CKTOTAL, CKMB, CKMBINDEX, TROPONINI in the last 168 hours. BNP (last 3 results) No results for input(s): PROBNP in the last 8760 hours. HbA1C: No results for input(s): HGBA1C in the last 72 hours. CBG: Recent Labs  Lab 01/07/20 1205 01/07/20 1704 01/07/20 2122 01/08/20 0748 01/08/20 1237  GLUCAP 199* 229* 215* 189* 180*   Lipid Profile: No results for input(s): CHOL, HDL, LDLCALC, TRIG, CHOLHDL, LDLDIRECT in the last 72 hours. Thyroid Function Tests: No results for input(s): TSH, T4TOTAL, FREET4, T3FREE, THYROIDAB in the last 72 hours. Anemia Panel: Recent Labs    01/06/20 0522  TIBC 145*  IRON 15*   Sepsis Labs: No results for input(s): PROCALCITON, LATICACIDVEN in the last 168 hours.  Recent Results (from the past 240 hour(s))  Culture, blood (routine x 2)     Status: None (Preliminary result)   Collection Time: 01/04/20  5:43 PM   Specimen: BLOOD LEFT HAND  Result Value Ref Range Status   Specimen Description   Final    BLOOD LEFT HAND Performed at [facility] [address] [place], [place] [address]    Special Requests   Final    BOTTLES DRAWN AEROBIC AND ANAEROBIC Blood Culture adequate volume Performed at [facility] [address], [place], [place] [address]    Culture   Final    NO GROWTH 4 DAYS Performed at [facility], [place] [address], [place], [place] [address]    Report Status PENDING  Incomplete  Culture, blood (routine x 2)     Status: None (Preliminary result)   Collection Time: 01/04/20  5:51 PM   Specimen: BLOOD  Result Value Ref Range Status   Specimen Description   Final    BLOOD RIGHT ANTECUBITAL Performed at [facility] [address] [place], [place] [address]    Special Requests   Final    BOTTLES DRAWN AEROBIC ONLY Blood Culture results may not be optimal due to an inadequate volume of blood received in culture bottles Performed at  [facility] [address], [place], [place] [address]    Culture  Final    NO GROWTH 4 DAYS Performed at [facility], [place] [address], [place], [place] [address]    Report Status PENDING  Incomplete     RN Pressure Injury Documentation:     Estimated body mass index is 30.27 kg/m as calculated from the following:   Height as of this encounter: 5\' 10"  (1.778 m).   Weight as of this encounter: 95.7 kg.  Malnutrition Type:  Nutrition Problem: Increased nutrient needs Etiology: post-op healing   Malnutrition Characteristics:  Signs/Symptoms: estimated needs   Nutrition Interventions:  Interventions: Other (Comment) (rena-vite, prosource plus)   Radiology Studies: No results found. Scheduled Meds: . (feeding supplement) PROSource Plus  30 mL Oral TID BM  . sodium chloride   Intravenous Once  . amLODipine  10 mg Oral Daily  . atorvastatin  40 mg Oral q1800  . carvedilol  25 mg Oral BID WC  . Chlorhexidine Gluconate Cloth  6 each Topical Q0600  . collagenase   Topical Daily  . dexlansoprazole  60 mg Oral Daily  . docusate sodium  100 mg Oral BID  . DULoxetine  60 mg Oral Daily  . heparin injection (subcutaneous)  5,000 Units Subcutaneous Q8H  . hydrALAZINE  100 mg Oral TID  . insulin aspart  0-5 Units Subcutaneous QHS  . insulin aspart  0-9 Units Subcutaneous TID WC  . insulin aspart  5 Units Subcutaneous TID WC  . insulin glargine  26 Units Subcutaneous QHS  . LORazepam      . multivitamin  1 tablet Oral QHS  . sodium bicarbonate  1,300 mg Oral BID   Continuous Infusions: . sodium chloride 10 mL/hr at 01/07/20 0600  .  ceFAZolin (ANCEF) IV Stopped (01/08/20 0339)    LOS: 11 days   Phillips Climes, MD [facility] PAGER is on AMION  If 7PM-7AM, please contact night-coverage [email or web address]

## 2020-01-08 NOTE — Progress Notes (Signed)
  Progress Note    01/08/2020 10:45 AM 10 Days Post-Op  Subjective: No overnight issues  Vitals:   01/07/20 1939 01/08/20 0313  BP: (!) 126/53 (!) 139/54  Pulse: 62 62  Resp:  17  Temp: 98.5 F (36.9 C) 97.7 F (36.5 C)  SpO2: 96% 95%    Physical Exam: She is awake alert and oriented Nonlabored respirations Palpable left radial artery pulse at the wrist  CBC    Component Value Date/Time   WBC 15.6 (H) 01/07/2020 0107   RBC 2.59 (L) 01/07/2020 0107   HGB 7.3 (L) 01/07/2020 0107   HGB 12.0 10/29/2018 1439   HCT 22.5 (L) 01/07/2020 0107   HCT 36.6 10/29/2018 1439   PLT 231 01/07/2020 0107   PLT 345 10/29/2018 1439   MCV 86.9 01/07/2020 0107   MCV 84 10/29/2018 1439   MCH 28.2 01/07/2020 0107   MCHC 32.4 01/07/2020 0107   RDW 15.0 01/07/2020 0107   RDW 13.4 10/29/2018 1439   LYMPHSABS 1.1 01/07/2020 0107   LYMPHSABS 2.6 10/29/2018 1439   MONOABS 1.5 (H) 01/07/2020 0107   EOSABS 0.2 01/07/2020 0107   EOSABS 0.2 10/29/2018 1439   BASOSABS 0.0 01/07/2020 0107   BASOSABS 0.1 10/29/2018 1439    BMET    Component Value Date/Time   NA 127 (L) 01/07/2020 0107   NA 140 10/29/2018 1439   K 4.2 01/07/2020 0107   CL 92 (L) 01/07/2020 0107   CO2 22 01/07/2020 0107   GLUCOSE 193 (H) 01/07/2020 0107   BUN 134 (H) 01/07/2020 0107   BUN 43 (H) 10/29/2018 1439   CREATININE 7.33 (H) 01/07/2020 0107   CREATININE 1.92 (H) 01/10/2016 1528   CALCIUM 7.7 (L) 01/07/2020 0107   GFRNONAA 6 (L) 01/07/2020 0107   GFRNONAA 30 (L) 01/10/2016 1528   GFRAA 13 (L) 12/17/2018 0907   GFRAA 35 (L) 01/10/2016 1528    INR    Component Value Date/Time   INR 1.1 12/28/2019 1200     Intake/Output Summary (Last 24 hours) at 01/08/2020 1045 Last data filed at 01/08/2020 0358 Gross per 24 hour  Intake 1052.92 ml  Output 900 ml  Net 152.92 ml     Assessment/plan:  54 y.o. female is now with end-stage renal disease.  Plan will be for left arm fistula versus graft until dialysis  catheter in the morning.  N.p.o. past midnight.   Carlie Solorzano C. Donzetta Matters, MD [facility] Office: [phone] Pager: [phone]  01/08/2020 10:45 AM

## 2020-01-08 NOTE — Progress Notes (Signed)
Pharmacy Antibiotic Note  [name] is a 54 y.o. female with CKD and s/p left chopart amputation in 2014, presented to the ED  on 12/28/2019 s/p fall with femur fracture.  She underwent left femur intramedullary fixation on 10/28. Patient with continued purulent drainage and concern for deeper infection. Patient refused MRI at this time. Pharmacy is consulted to start Ancef for possible osteomyelitis.   Labs from yesterday indicate CrCl ~11 ml/min, and UOP ~ 0.4 ml/kg/hr. Will continue current dosing at this time with close monitoring. WBC trending down from 24.7 on 11/4 to 15.6 today. Patient remains afebrile.  Plan: - Continue cefazolin 2g q12h - Monitor renal function closely and adjust dose if/when appropriate   Height: 5\' 10"  (177.8 cm) Weight: 95.7 kg (210 lb 15.7 oz) IBW/kg (Calculated) : 68.5  Temp (24hrs), Avg:98.4 F (36.9 C), Min:97.7 F (36.5 C), Max:99.3 F (37.4 C)  Recent Labs  Lab 01/03/20 0834 01/04/20 0521 01/05/20 0523 01/06/20 0522 01/07/20 0107  WBC 28.3* 18.6* 34.7* 21.2* 15.6*  CREATININE 6.22* 6.87* 6.76* 6.64* 7.33*    Estimated Creatinine Clearance: 11.3 mL/min (A) (by C-G formula based on SCr of 7.33 mg/dL (H)).    Allergies  Allergen Reactions  . Lidocaine Itching  . Trazodone Swelling  . Latex Rash     Thank you for allowing pharmacy to be a part of this patient's care.  Alfonse Spruce, PharmD PGY2 ID Pharmacy Resident Phone between 7 am - 3:30 pm: [phone]  Please check AMION for all [place] phone numbers After 10:00 PM, call [place] [phone]  01/08/2020 8:12 AM

## 2020-01-08 NOTE — Progress Notes (Signed)
[facility] Progress Note  Subjective: good UOP yest 1200 cc.  Seen in room, a little stronger after prbc's yest. No n/v or jerking/ confusion.    Vitals:   01/07/20 1525 01/07/20 1824 01/07/20 1939 01/08/20 0313  BP: (!) 136/54 (!) 136/49 (!) 126/53 (!) 139/54  Pulse: 66 62 62 62  Resp: '20 18  17  ' Temp: 98.4 F (36.9 C) 99.3 F (37.4 C) 98.5 F (36.9 C) 97.7 F (36.5 C)  TempSrc: Oral Oral Oral   SpO2: 95% 95% 96% 95%  Weight:      Height:        Exam: Gen alert, no distress Throat is moist No jvd or bruits Chest clear bilat to bases, no rales RRR no MRG Abd soft ntnd no mass or ascites +bs Ext no leg or UE edema, L foot amp site w/ open wound, blackening of skin in and around the wound Neuro is alert, Ox 3 , nf    Home meds:  - norvasc 5/ coreg 6.25/ asa / lipitor / hydralazine 50 tid  - insulin lantus 50u/ aspart 10u tid  - oxy IR prn/ cymbalta qd  - dexilant/ flexeril prn/ protonix 40     CXR - no acute disease     Last creat's outpatient >>      Aug 2021 - creat 4.9      Dec 23, 2019 - creat 5.2        Assessment/ Plan: 1. AoCKD 5 - b/l creat 4.9- 5.2, eGFR 9- 11 from 8/21- 10/21. Pt f/b Dr Posey Pronto. Creat 6 on admission, now mid 7's, BUN ^^. IVF's given w/o improvement.  Suspect infection contributing. Will need HD here. Have d/w Dr Donzetta Matters, they will place [facility] in addition to permanent access tomorrow (Monday) in [place]. 1st HD orders written for Monday after OR.  2. L hip fracture - admission diagnosis. sp ORIF on 10/28 per ortho 3. Met acidosis - better, cont bicarb 2 bid 4. Vasc access - consulted [facility] for access, for TDC/ perm access monday 5. Anemia ckd - Hb 7.6, was 6.3 on admit > got 2u prbc's, peak Hb 9 but now back in the 7-8's range. Not symptomatic. Tsat low 10%, got 573m dose Feraheme on 11/06.  6. 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 Seen by ID, ancef dose adjusted, needs MRI but pt  refusing 7. HTN/ vol  - BP's are stable / normal on her 3 home meds, no vol excess per exam 8. DM - on insulin, per pmd      Rob [place]11/09/2019, 9:02 AM   Recent Labs  Lab 01/06/20 0522 01/07/20 0107  K 3.9 4.2  BUN 123* 134*  CREATININE 6.64* 7.33*  CALCIUM 7.4* 7.7*  PHOS 5.5* 5.8*  HGB 7.1* 7.3*   Inpatient medications: . (feeding supplement) PROSource Plus  30 mL Oral TID BM  . sodium chloride   Intravenous Once  . amLODipine  10 mg Oral Daily  . atorvastatin  40 mg Oral q1800  . carvedilol  25 mg Oral BID WC  . collagenase   Topical Daily  . dexlansoprazole  60 mg Oral Daily  . docusate sodium  100 mg Oral BID  . DULoxetine  60 mg Oral Daily  . heparin injection (subcutaneous)  5,000 Units Subcutaneous Q8H  . hydrALAZINE  100 mg Oral TID  . insulin aspart  0-5 Units Subcutaneous QHS  . insulin  aspart  0-9 Units Subcutaneous TID WC  . insulin aspart  3 Units Subcutaneous TID WC  . insulin glargine  24 Units Subcutaneous QHS  . multivitamin  1 tablet Oral QHS  . sodium bicarbonate  1,300 mg Oral BID   . sodium chloride 10 mL/hr at 01/07/20 0600  .  ceFAZolin (ANCEF) IV Stopped (01/08/20 0339)   sodium chloride, alum & mag hydroxide-simeth, calcium carbonate, cyclobenzaprine, guaiFENesin-dextromethorphan, hydrALAZINE, HYDROcodone-acetaminophen, lip balm, loratadine, menthol-cetylpyridinium **OR** phenol, metoCLOPramide **OR** metoCLOPramide (REGLAN) injection, morphine injection, Muscle Rub, ondansetron **OR** ondansetron (ZOFRAN) IV, polyvinyl alcohol, senna-docusate, sodium chloride

## 2020-01-08 NOTE — Progress Notes (Signed)
MRI done today, permit signed for AV graft and TDC. Pt understands that she is NPO after MN.

## 2020-01-08 NOTE — Progress Notes (Signed)
ID PROGRESS NOTE   54yo F with CKD 5-ESRD, being set up for HD, with worsening left ankle ulcer, concerning for calcaneal osteomyelitis. Leukocytosis improving while on cefazolin. Awaiting MRI read. Dr Donzetta Matters to take to the OR tomorrow for Left arm fistula v. Graft.  From ID standpoint, we will see her tomorrow. Continue on cefazolin. Will review MRI to see if she will need surgical debridement. Anticipate that she will need 6 wk of IV cefazolin with HD. And ongoing wound care.  Elzie Rings [place] for Infectious Diseases [phone]

## 2020-01-09 ENCOUNTER — Encounter (HOSPITAL_COMMUNITY)
Admission: EM | Disposition: A | Discharge status: Rehabilitation Facility | Payer: Self-pay | Source: Home / Self Care | Attending: Internal Medicine

## 2020-01-09 ENCOUNTER — Encounter (HOSPITAL_COMMUNITY): Payer: Self-pay | Admitting: Internal Medicine

## 2020-01-09 ENCOUNTER — Inpatient Hospital Stay (HOSPITAL_COMMUNITY): Payer: Medicaid Other

## 2020-01-09 ENCOUNTER — Telehealth: Payer: Self-pay | Admitting: Podiatry

## 2020-01-09 ENCOUNTER — Inpatient Hospital Stay (HOSPITAL_COMMUNITY): Payer: Medicaid Other | Admitting: Anesthesiology

## 2020-01-09 DIAGNOSIS — Z20822 Contact with and (suspected) exposure to covid-19: Secondary | ICD-10-CM | POA: Diagnosis not present

## 2020-01-09 DIAGNOSIS — E871 Hypo-osmolality and hyponatremia: Secondary | ICD-10-CM | POA: Diagnosis not present

## 2020-01-09 DIAGNOSIS — N185 Chronic kidney disease, stage 5: Secondary | ICD-10-CM | POA: Diagnosis not present

## 2020-01-09 DIAGNOSIS — E8770 Fluid overload, unspecified: Secondary | ICD-10-CM | POA: Diagnosis not present

## 2020-01-09 DIAGNOSIS — E872 Acidosis: Secondary | ICD-10-CM | POA: Diagnosis not present

## 2020-01-09 DIAGNOSIS — I517 Cardiomegaly: Secondary | ICD-10-CM | POA: Diagnosis not present

## 2020-01-09 DIAGNOSIS — M86672 Other chronic osteomyelitis, left ankle and foot: Secondary | ICD-10-CM | POA: Diagnosis not present

## 2020-01-09 DIAGNOSIS — L97329 Non-pressure chronic ulcer of left ankle with unspecified severity: Secondary | ICD-10-CM | POA: Diagnosis not present

## 2020-01-09 DIAGNOSIS — N179 Acute kidney failure, unspecified: Secondary | ICD-10-CM | POA: Diagnosis not present

## 2020-01-09 DIAGNOSIS — I120 Hypertensive chronic kidney disease with stage 5 chronic kidney disease or end stage renal disease: Secondary | ICD-10-CM | POA: Diagnosis not present

## 2020-01-09 DIAGNOSIS — D649 Anemia, unspecified: Secondary | ICD-10-CM | POA: Diagnosis not present

## 2020-01-09 DIAGNOSIS — E118 Type 2 diabetes mellitus with unspecified complications: Secondary | ICD-10-CM | POA: Diagnosis not present

## 2020-01-09 DIAGNOSIS — L97524 Non-pressure chronic ulcer of other part of left foot with necrosis of bone: Secondary | ICD-10-CM | POA: Diagnosis not present

## 2020-01-09 DIAGNOSIS — T148XXA Other injury of unspecified body region, initial encounter: Secondary | ICD-10-CM | POA: Diagnosis not present

## 2020-01-09 DIAGNOSIS — E1165 Type 2 diabetes mellitus with hyperglycemia: Secondary | ICD-10-CM | POA: Diagnosis not present

## 2020-01-09 DIAGNOSIS — Z452 Encounter for adjustment and management of vascular access device: Secondary | ICD-10-CM | POA: Diagnosis not present

## 2020-01-09 DIAGNOSIS — L02612 Cutaneous abscess of left foot: Secondary | ICD-10-CM | POA: Diagnosis not present

## 2020-01-09 DIAGNOSIS — L02416 Cutaneous abscess of left lower limb: Secondary | ICD-10-CM | POA: Diagnosis not present

## 2020-01-09 DIAGNOSIS — L03116 Cellulitis of left lower limb: Secondary | ICD-10-CM | POA: Diagnosis not present

## 2020-01-09 DIAGNOSIS — E1129 Type 2 diabetes mellitus with other diabetic kidney complication: Secondary | ICD-10-CM | POA: Diagnosis not present

## 2020-01-09 DIAGNOSIS — D62 Acute posthemorrhagic anemia: Secondary | ICD-10-CM | POA: Diagnosis not present

## 2020-01-09 DIAGNOSIS — S72142A Displaced intertrochanteric fracture of left femur, initial encounter for closed fracture: Secondary | ICD-10-CM | POA: Diagnosis not present

## 2020-01-09 DIAGNOSIS — M869 Osteomyelitis, unspecified: Secondary | ICD-10-CM | POA: Diagnosis not present

## 2020-01-09 DIAGNOSIS — N186 End stage renal disease: Secondary | ICD-10-CM | POA: Diagnosis not present

## 2020-01-09 HISTORY — PX: AV FISTULA PLACEMENT: SHX1204

## 2020-01-09 HISTORY — PX: INSERTION OF DIALYSIS CATHETER: SHX1324

## 2020-01-09 LAB — RENAL FUNCTION PANEL
Albumin: 1.7 g/dL — ABNORMAL LOW (ref 3.5–5.0)
Anion gap: 14 (ref 5–15)
BUN: 148 mg/dL — ABNORMAL HIGH (ref 6–20)
CO2: 21 mmol/L — ABNORMAL LOW (ref 22–32)
Calcium: 8.3 mg/dL — ABNORMAL LOW (ref 8.9–10.3)
Chloride: 92 mmol/L — ABNORMAL LOW (ref 98–111)
Creatinine, Ser: 7.67 mg/dL — ABNORMAL HIGH (ref 0.44–1.00)
GFR, Estimated: 6 mL/min — ABNORMAL LOW (ref >60)
Glucose, Bld: 216 mg/dL — ABNORMAL HIGH (ref 70–99)
Phosphorus: 6.7 mg/dL — ABNORMAL HIGH (ref 2.5–4.6)
Potassium: 4.2 mmol/L (ref 3.5–5.1)
Sodium: 127 mmol/L — ABNORMAL LOW (ref 135–145)

## 2020-01-09 LAB — CULTURE, BLOOD (ROUTINE X 2)
Culture: NO GROWTH
Culture: NO GROWTH
Special Requests: ADEQUATE

## 2020-01-09 LAB — CBC
HCT: 25.8 % — ABNORMAL LOW (ref 36.0–46.0)
Hemoglobin: 8.2 g/dL — ABNORMAL LOW (ref 12.0–15.0)
MCH: 27.2 pg (ref 26.0–34.0)
MCHC: 31.8 g/dL (ref 30.0–36.0)
MCV: 85.7 fL (ref 80.0–100.0)
Platelets: 261 10*3/uL (ref 150–400)
RBC: 3.01 MIL/uL — ABNORMAL LOW (ref 3.87–5.11)
RDW: 14.8 % (ref 11.5–15.5)
WBC: 18 10*3/uL — ABNORMAL HIGH (ref 4.0–10.5)
nRBC: 0 % (ref 0.0–0.2)

## 2020-01-09 LAB — GLUCOSE, CAPILLARY
Glucose-Capillary: 218 mg/dL — ABNORMAL HIGH (ref 70–99)
Glucose-Capillary: 231 mg/dL — ABNORMAL HIGH (ref 70–99)
Glucose-Capillary: 173 mg/dL — ABNORMAL HIGH (ref 70–99)
Glucose-Capillary: 169 mg/dL — ABNORMAL HIGH (ref 70–99)
Glucose-Capillary: 245 mg/dL — ABNORMAL HIGH (ref 70–99)
Glucose-Capillary: 314 mg/dL — ABNORMAL HIGH (ref 70–99)

## 2020-01-09 LAB — SURGICAL PCR SCREEN
MRSA, PCR: NEGATIVE
Staphylococcus aureus: NEGATIVE

## 2020-01-09 SURGERY — ARTERIOVENOUS (AV) FISTULA CREATION
Anesthesia: General | Site: Chest | Laterality: Right

## 2020-01-09 MED ORDER — OXYCODONE HCL 5 MG PO TABS
5.0000 mg | ORAL_TABLET | ORAL | Status: DC | PRN
  Administered 2020-01-10 – 2020-01-12 (×6): 10 mg via ORAL
  Filled 2020-01-09 (×6): qty 2

## 2020-01-09 MED ORDER — POVIDONE-IODINE 10 % EX SWAB: 2.0000 "application " | Freq: Once | CUTANEOUS | Status: DC

## 2020-01-09 MED ORDER — HEPARIN SODIUM (PORCINE) 1000 UNIT/ML IJ SOLN
INTRAMUSCULAR | Status: AC
  Filled 2020-01-09: qty 1

## 2020-01-09 MED ORDER — CHLORHEXIDINE GLUCONATE 0.12 % MT SOLN
OROMUCOSAL | Status: AC
  Administered 2020-01-09: 15 mL via OROMUCOSAL
  Filled 2020-01-09: qty 15

## 2020-01-09 MED ORDER — SODIUM CHLORIDE 0.9 % IV SOLN
INTRAVENOUS | Status: DC | PRN
  Administered 2020-01-09: 500 mL

## 2020-01-09 MED ORDER — HEPARIN SODIUM (PORCINE) 1000 UNIT/ML IJ SOLN
INTRAMUSCULAR | Status: DC | PRN
  Administered 2020-01-09: 5000 [IU] via INTRAVENOUS

## 2020-01-09 MED ORDER — CHLORHEXIDINE GLUCONATE 4 % EX LIQD: 60.0000 mL | Freq: Once | CUTANEOUS | Status: DC

## 2020-01-09 MED ORDER — 0.9 % SODIUM CHLORIDE (POUR BTL) OPTIME
TOPICAL | Status: DC | PRN
  Administered 2020-01-09: 1000 mL

## 2020-01-09 MED ORDER — SODIUM CHLORIDE 0.9 % IV SOLN: INTRAVENOUS | Status: DC

## 2020-01-09 MED ORDER — PHENYLEPHRINE HCL-NACL 10-0.9 MG/250ML-% IV SOLN
INTRAVENOUS | Status: DC | PRN
  Administered 2020-01-09: 25 ug/min via INTRAVENOUS

## 2020-01-09 MED ORDER — HEPARIN SODIUM (PORCINE) 5000 UNIT/ML IJ SOLN
5000.0000 [IU] | Freq: Three times a day (TID) | INTRAMUSCULAR | Status: AC
  Administered 2020-01-09 – 2020-01-10 (×2): 5000 [IU] via SUBCUTANEOUS
  Filled 2020-01-09 (×2): qty 1

## 2020-01-09 MED ORDER — MIDAZOLAM HCL 2 MG/2ML IJ SOLN
INTRAMUSCULAR | Status: AC
  Filled 2020-01-09: qty 2

## 2020-01-09 MED ORDER — CEFAZOLIN SODIUM-DEXTROSE 2-4 GM/100ML-% IV SOLN
2.0000 g | INTRAVENOUS | Status: AC
  Filled 2020-01-09: qty 100

## 2020-01-09 MED ORDER — PROPOFOL 10 MG/ML IV BOLUS
INTRAVENOUS | Status: DC | PRN
  Administered 2020-01-09: 200 mg via INTRAVENOUS

## 2020-01-09 MED ORDER — MIDAZOLAM HCL 2 MG/2ML IJ SOLN
INTRAMUSCULAR | Status: DC | PRN
  Administered 2020-01-09: 2 mg via INTRAVENOUS

## 2020-01-09 MED ORDER — PROPOFOL 10 MG/ML IV BOLUS
INTRAVENOUS | Status: AC
  Filled 2020-01-09: qty 20

## 2020-01-09 MED ORDER — ACETAMINOPHEN 325 MG PO TABS
650.0000 mg | ORAL_TABLET | Freq: Four times a day (QID) | ORAL | Status: DC
  Administered 2020-01-09 – 2020-01-19 (×29): 650 mg via ORAL
  Filled 2020-01-09 (×29): qty 2

## 2020-01-09 MED ORDER — FENTANYL CITRATE (PF) 250 MCG/5ML IJ SOLN
INTRAMUSCULAR | Status: AC
  Filled 2020-01-09: qty 5

## 2020-01-09 MED ORDER — EPHEDRINE SULFATE-NACL 50-0.9 MG/10ML-% IV SOSY
PREFILLED_SYRINGE | INTRAVENOUS | Status: DC | PRN
  Administered 2020-01-09: 10 mg via INTRAVENOUS
  Administered 2020-01-09: 5 mg via INTRAVENOUS

## 2020-01-09 MED ORDER — CHLORHEXIDINE GLUCONATE 0.12 % MT SOLN
15.0000 mL | Freq: Once | OROMUCOSAL | Status: AC
  Filled 2020-01-09: qty 15

## 2020-01-09 MED ORDER — FENTANYL CITRATE (PF) 100 MCG/2ML IJ SOLN: 25.0000 ug | INTRAMUSCULAR | Status: DC | PRN

## 2020-01-09 MED ORDER — BUPIVACAINE HCL 0.5 % IJ SOLN
INTRAMUSCULAR | Status: DC | PRN
  Administered 2020-01-09: 19 mL

## 2020-01-09 MED ORDER — ONDANSETRON HCL 4 MG/2ML IJ SOLN
INTRAMUSCULAR | Status: DC | PRN
  Administered 2020-01-09: 4 mg via INTRAVENOUS

## 2020-01-09 MED ORDER — HEPARIN SODIUM (PORCINE) 1000 UNIT/ML IJ SOLN
INTRAMUSCULAR | Status: DC | PRN
  Administered 2020-01-09: 1800 [IU] via INTRAVENOUS

## 2020-01-09 MED ORDER — FENTANYL CITRATE (PF) 250 MCG/5ML IJ SOLN
INTRAMUSCULAR | Status: DC | PRN
  Administered 2020-01-09: 50 ug via INTRAVENOUS

## 2020-01-09 MED ORDER — KIDNEY FAILURE BOOK: Freq: Once | Status: AC

## 2020-01-09 MED ORDER — EPHEDRINE 5 MG/ML INJ
INTRAVENOUS | Status: AC
  Filled 2020-01-09: qty 10

## 2020-01-09 MED ORDER — ACETAMINOPHEN 500 MG PO TABS: 1000.0000 mg | ORAL_TABLET | Freq: Once | ORAL | Status: DC

## 2020-01-09 MED ORDER — DEXAMETHASONE SODIUM PHOSPHATE 10 MG/ML IJ SOLN
INTRAMUSCULAR | Status: DC | PRN
  Administered 2020-01-09: 4 mg via INTRAVENOUS

## 2020-01-09 SURGICAL SUPPLY — 55 items
APL PRP STRL LF DISP 70% ISPRP (MISCELLANEOUS) ×2
APL SKNCLS STERI-STRIP NONHPOA (GAUZE/BANDAGES/DRESSINGS) ×2
ARMBAND PINK RESTRICT EXTREMIT (MISCELLANEOUS) ×3 IMPLANT
BAG DECANTER FOR FLEXI CONT (MISCELLANEOUS) ×3 IMPLANT
BENZOIN TINCTURE PRP APPL 2/3 (GAUZE/BANDAGES/DRESSINGS) ×3 IMPLANT
BIOPATCH RED 1 DISK 7.0 (GAUZE/BANDAGES/DRESSINGS) ×3 IMPLANT
CANISTER SUCT 3000ML PPV (MISCELLANEOUS) ×3 IMPLANT
CANNULA VESSEL 3MM 2 BLNT TIP (CANNULA) ×3 IMPLANT
CATH PALINDROME-P 19CM W/VT (CATHETERS) ×1 IMPLANT
CATH PALINDROME-P 23CM W/VT (CATHETERS) IMPLANT
CATH PALINDROME-P 28CM W/VT (CATHETERS) IMPLANT
CHLORAPREP W/TINT 26 (MISCELLANEOUS) ×3 IMPLANT
CLIP VESOCCLUDE MED 6/CT (CLIP) ×3 IMPLANT
CLIP VESOCCLUDE SM WIDE 6/CT (CLIP) ×3 IMPLANT
COVER PROBE W GEL 5X96 (DRAPES) ×3 IMPLANT
COVER SURGICAL LIGHT HANDLE (MISCELLANEOUS) ×3 IMPLANT
DRAPE C-ARM 42X72 X-RAY (DRAPES) ×3 IMPLANT
DRAPE CHEST BREAST 15X10 FENES (DRAPES) ×3 IMPLANT
ELECT REM PT RETURN 9FT ADLT (ELECTROSURGICAL) ×3
ELECTRODE REM PT RTRN 9FT ADLT (ELECTROSURGICAL) ×2 IMPLANT
GAUZE 4X4 16PLY RFD (DISPOSABLE) ×3 IMPLANT
GAUZE SPONGE 4X4 12PLY STRL (GAUZE/BANDAGES/DRESSINGS) ×3 IMPLANT
GLOVE SURG SS PI 8.0 STRL IVOR (GLOVE) ×3 IMPLANT
GOWN STRL REUS W/ TWL LRG LVL3 (GOWN DISPOSABLE) ×4 IMPLANT
GOWN STRL REUS W/ TWL XL LVL3 (GOWN DISPOSABLE) ×2 IMPLANT
GOWN STRL REUS W/TWL LRG LVL3 (GOWN DISPOSABLE) ×6
GOWN STRL REUS W/TWL XL LVL3 (GOWN DISPOSABLE) ×3
INSERT FOGARTY SM (MISCELLANEOUS) IMPLANT
KIT BASIN OR (CUSTOM PROCEDURE TRAY) ×3 IMPLANT
KIT PALINDROME-P 55CM (CATHETERS) IMPLANT
KIT TURNOVER KIT B (KITS) ×3 IMPLANT
NDL 18GX1X1/2 (RX/OR ONLY) (NEEDLE) ×2 IMPLANT
NDL HYPO 25GX1X1/2 BEV (NEEDLE) ×2 IMPLANT
NEEDLE 18GX1X1/2 (RX/OR ONLY) (NEEDLE) ×3 IMPLANT
NEEDLE HYPO 25GX1X1/2 BEV (NEEDLE) ×3 IMPLANT
NS IRRIG 1000ML POUR BTL (IV SOLUTION) ×3 IMPLANT
PACK CV ACCESS (CUSTOM PROCEDURE TRAY) ×3 IMPLANT
PACK SURGICAL SETUP 50X90 (CUSTOM PROCEDURE TRAY) ×3 IMPLANT
PAD ARMBOARD 7.5X6 YLW CONV (MISCELLANEOUS) ×6 IMPLANT
PENCIL SMOKE EVACUATOR (MISCELLANEOUS) ×1 IMPLANT
SOAP 2 % CHG 4 OZ (WOUND CARE) ×3 IMPLANT
STRIP CLOSURE SKIN 1/2X4 (GAUZE/BANDAGES/DRESSINGS) ×3 IMPLANT
SUT ETHILON 3 0 PS 1 (SUTURE) ×3 IMPLANT
SUT MNCRL AB 4-0 PS2 18 (SUTURE) ×4 IMPLANT
SUT PROLENE 6 0 BV (SUTURE) ×4 IMPLANT
SUT VIC AB 3-0 SH 27 (SUTURE) ×3
SUT VIC AB 3-0 SH 27X BRD (SUTURE) ×2 IMPLANT
SYR 10ML LL (SYRINGE) ×3 IMPLANT
SYR 20ML LL LF (SYRINGE) ×6 IMPLANT
SYR 5ML LL (SYRINGE) ×3 IMPLANT
SYR CONTROL 10ML LL (SYRINGE) ×3 IMPLANT
TOWEL GREEN STERILE (TOWEL DISPOSABLE) ×3 IMPLANT
TOWEL GREEN STERILE FF (TOWEL DISPOSABLE) ×6 IMPLANT
UNDERPAD 30X36 HEAVY ABSORB (UNDERPADS AND DIAPERS) ×3 IMPLANT
WATER STERILE IRR 1000ML POUR (IV SOLUTION) ×3 IMPLANT

## 2020-01-09 NOTE — Progress Notes (Signed)
PT Cancellation Note  Patient Details Name: [name] MRN: [id number] DOB: 05-18-1965   Cancelled Treatment:    Reason Eval/Treat Not Completed: Patient at procedure or test/unavailable Patient off unit at procedure for fistula placement. PT will re-attempt session as time allows.   Perrin Maltese, PT, DPT Acute Rehabilitation Services Pager [phone] Office [phone]    Alda Lea 01/09/2020, 12:06 PM

## 2020-01-09 NOTE — TOC Progression Note (Signed)
Transition of Care [facility]) - Progression Note    Patient Details  Name: SARAIYAH HEMMINGER MRN: [id number] Date of Birth: Apr 22, 1965  Transition of Care [facility]) CM/SW [place], [place] Phone Number: 01/09/2020, 3:55 PM  Clinical Narrative:     CSW met with pt to discuss [place] vs SNF. Pt maintains that she does not want SNF and wants to go home. She does not have preference for [facility]. CSW explains that [facility] services would depend on availability of medicaid providers.   Pt lives at home with her daughter in a single story home with 3 steps. She has no DME and reports she would need a 3in1, rolling walker, and wheelchair. Pt states her daughter is her only support and she works sporadic hours at [employer]. Pt consents to CSW calling daughter to update on plans.   Called and left message with pt daughter.   Expected Discharge Plan: [place] Barriers to Discharge: Continued Medical Work up  Expected Discharge Plan and Services Expected Discharge Plan: [place]   Discharge Planning Services: CM Consult Post Acute Care Choice: IP Rehab Living arrangements for the past 2 months: Single Family Home                                       Social Determinants of Health (SDOH) Interventions    Readmission Risk Interventions No flowsheet data found.

## 2020-01-09 NOTE — Transfer of Care (Signed)
Immediate Anesthesia Transfer of Care Note  Patient: [name]  Procedure(s) Performed: LEFT UPPER ARM ARTERIOVENOUS (AV) FISTULA CREATION (Left Arm Upper) INSERTION OF DIALYSIS CATHETER (Right Chest)  Patient Location: PACU  Anesthesia Type:General  Level of Consciousness: drowsy and patient cooperative  Airway & Oxygen Therapy: Patient Spontanous Breathing and Patient connected to face mask oxygen  Post-op Assessment: Report given to RN and Post -op Vital signs reviewed and stable  Post vital signs: Reviewed and stable  Last Vitals:  Vitals Value Taken Time  BP 138/52 01/09/20 1149  Temp    Pulse 54 01/09/20 1155  Resp 15 01/09/20 1155  SpO2 93 % 01/09/20 1155  Vitals shown include unvalidated device data.  Last Pain:  Vitals:   01/09/20 0804  TempSrc: Oral  PainSc:       Patients Stated Pain Goal: 0 (27/80/04 4715)  Complications: No complications documented.

## 2020-01-09 NOTE — Progress Notes (Signed)
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 Large Plantar Open wound at the left Foot with complex soft tissue abscess 2. Extensive OM of the calcaneus and numerous small bone abscesses  3. H/o chronic OM of the left foot s/p chopart amputation in 2015 with chronic non healing ulcer  4. Type 2 DM with peripheral neuropathy 5. CKD heading towards dialysis - underwent tunneled HD catheter placement today and LUE AV fistula creation  Recommendations Orthopedic note reviewed, agree with Dr Doran Durand regarding source control with a left BKA. Would recommend to continue cefazolin as is for the meantime. He may not need further abx if there is adequate source control. Monitor CBC and CMP while on IV abx Further recommendations to follow after Left BKA planned tomorrow.   Rest of the management as per the primary team. Thank you for the consult. Please page with pertinent questions or concerns.  Rosiland Oz, MD Infectious [place] for Infectious Diseases   To contact the attending provider between 8A-5P or the covering provider during after hours 5P-8A, please log into the web site [email or web address] and access using universal [place] password for that web site. If you do not have the password, please call  the hospital operator. ______________________________________________________________________ Subjective patient seen and examined at the bedside. Denies any complaints. Working with PT.    Vitals BP (!) 137/52   Pulse (!) 59   Temp 98 F (36.7 C) (Oral)   Resp 12   Ht 5\' 10"  (1.778 m)   Wt 95.7 kg   SpO2 96%   BMI 30.27 kg/m     Physical Exam Constitutional:  NAD    Comments:   Cardiovascular:     Rate and Rhythm: Normal rate and regular rhythm.     Heart sounds:   Pulmonary:     Effort: Pulmonary effort is normal.     Comments:   Abdominal:     Palpations: Abdomen is soft.     Tenderness: Non tender  Musculoskeletal:        General: left foot is wrapped  Skin:    Comments:   Neurological:     General: No focal deficit present.   Psychiatric:        Mood and Affect: Mood normal.    LINES/TUBES: PIVs and Rt IJ HD catheter  METAL IMPLANT/HARDWARE: Left UE AV fistula    Pertinent Microbiology Results for orders placed or performed during the hospital encounter of 12/28/19  Respiratory Panel by RT PCR (Flu A&B, Covid) - Nasopharyngeal Swab     Status: None   Collection Time: 12/28/19 12:00 PM   Specimen: Nasopharyngeal Swab  Result Value Ref Range Status   SARS Coronavirus 2 by RT PCR NEGATIVE NEGATIVE Final    Comment: (NOTE) SARS-CoV-2 target nucleic acids are NOT DETECTED.  The SARS-CoV-2 RNA is generally detectable  in upper respiratoy specimens during the acute phase of infection. The lowest concentration of SARS-CoV-2 viral copies this assay can detect is 131 copies/mL. A negative result does not preclude SARS-Cov-2 infection and should not be used as the sole basis for treatment or other patient management decisions. A negative result may occur with  improper specimen collection/handling, submission of specimen other than nasopharyngeal swab, presence of viral mutation(s) within the areas targeted by this assay, and inadequate number of viral  copies (<131 copies/mL). A negative result must be combined with clinical observations, patient history, and epidemiological information. The expected result is Negative.  Fact Sheet for Patients:  PinkCheek.be  Fact Sheet for Healthcare Providers:  GravelBags.it  This test is no t yet approved or cleared by the [place] FDA and  has been authorized for detection and/or diagnosis of SARS-CoV-2 by FDA under an Emergency Use Authorization (EUA). This EUA will remain  in effect (meaning this test can be used) for the duration of the COVID-19 declaration under Section 564(b)(1) of the Act, 21 U.S.C. section 360bbb-3(b)(1), unless the authorization is terminated or revoked sooner.     Influenza A by PCR NEGATIVE NEGATIVE Final   Influenza B by PCR NEGATIVE NEGATIVE Final    Comment: (NOTE) The Xpert Xpress SARS-CoV-2/FLU/RSV assay is intended as an aid in  the diagnosis of influenza from Nasopharyngeal swab specimens and  should not be used as a sole basis for treatment. Nasal washings and  aspirates are unacceptable for Xpert Xpress SARS-CoV-2/FLU/RSV  testing.  Fact Sheet for Patients: PinkCheek.be  Fact Sheet for Healthcare Providers: GravelBags.it  This test is not yet approved or cleared by the [place] FDA and  has been authorized for detection and/or diagnosis of SARS-CoV-2 by  FDA under an Emergency Use Authorization (EUA). This EUA will remain  in effect (meaning this test can be used) for the duration of the  Covid-19 declaration under Section 564(b)(1) of the Act, 21  U.S.C. section 360bbb-3(b)(1), unless the authorization is  terminated or revoked. Performed at [facility], [place] [address], [place], [place] [address]   Surgical pcr screen     Status: Abnormal   Collection Time: 12/28/19  4:50 PM   Specimen: Nasal Mucosa;  Nasal Swab  Result Value Ref Range Status   MRSA, PCR NEGATIVE NEGATIVE Final   Staphylococcus aureus POSITIVE (A) NEGATIVE Final    Comment: (NOTE) The Xpert SA Assay (FDA approved for NASAL specimens in patients 44 years of age and older), is one component of a comprehensive surveillance program. It is not intended to diagnose infection nor to guide or monitor treatment. Performed at [facility], [place] [address] [place], [place] [address]   Culture, blood (routine x 2)     Status: None   Collection Time: 01/04/20  5:43 PM   Specimen: BLOOD LEFT HAND  Result Value Ref Range Status   Specimen Description   Final    BLOOD LEFT HAND Performed at [place] [address], [place], [place] [address]    Special Requests   Final    BOTTLES DRAWN AEROBIC AND ANAEROBIC Blood Culture adequate volume Performed at [place] [address], [place], [place] [address]    Culture   Final    NO GROWTH 5 DAYS Performed at [facility], [place] [address] [place], [place] [address]    Report Status 01/09/2020 FINAL  Final  Culture, blood (routine x 2)  Status: None   Collection Time: 01/04/20  5:51 PM   Specimen: BLOOD  Result Value Ref Range Status   Specimen Description   Final    BLOOD RIGHT ANTECUBITAL Performed at [place] [address], [place], [place] [address]    Special Requests   Final    BOTTLES DRAWN AEROBIC ONLY Blood Culture results may not be optimal due to an inadequate volume of blood received in culture bottles Performed at [place] [address] [place], [place] [address]    Culture   Final    NO GROWTH 5 DAYS Performed at [facility], [place] [address], [place],  [address]    Report Status 01/09/2020 FINAL  Final    Pertinent Lab. CBC Latest Ref Rng & Units 01/09/2020 01/08/2020 01/07/2020  WBC 4.0 - 10.5 K/uL 18.0(H) 18.9(H) 15.6(H)   Hemoglobin 12.0 - 15.0 g/dL 8.2(L) 8.6(L) 7.3(L)  Hematocrit 36 - 46 % 25.8(L) 25.9(L) 22.5(L)  Platelets 150 - 400 K/uL 261 266 231   CMP Latest Ref Rng & Units 01/09/2020 01/08/2020 01/07/2020  Glucose 70 - 99 mg/dL 216(H) 223(H) 193(H)  BUN 6 - 20 mg/dL 148(H) 143(H) 134(H)  Creatinine 0.44 - 1.00 mg/dL 7.67(H) 7.55(H) 7.33(H)  Sodium 135 - 145 mmol/L 127(L) 126(L) 127(L)  Potassium 3.5 - 5.1 mmol/L 4.2 4.3 4.2  Chloride 98 - 111 mmol/L 92(L) 92(L) 92(L)  CO2 22 - 32 mmol/L 21(L) 19(L) 22  Calcium 8.9 - 10.3 mg/dL 8.3(L) 8.2(L) 7.7(L)  Total Protein 6.5 - 8.1 g/dL - - 5.8(L)  Total Bilirubin 0.3 - 1.2 mg/dL - - 0.7  Alkaline Phos 38 - 126 U/L - - 223(H)  AST 15 - 41 U/L - - 21  ALT 0 - 44 U/L - - 6    Pertinent Imaging today Plain films and CT images have been personally visualized and interpreted; radiology reports have been reviewed. Decision making incorporated into the Impression / Recommendations.  MRI left ankle WO contrast 01/09/20 FINDINGS: There is a large open wound on the plantar aspect of the hindfoot with an associated underlying complex soft tissue abscess measuring approximately 3.3 x 2.9 cm. This extends all the way down to the calcaneus. There is associated extensive osteomyelitis involving the calcaneus with numerous small bone abscesses (Brodie abscesses). I do not see any definite findings for septic arthritis involving the subtalar joints and the talus appears intact. The tibiotalar joint is also maintained and the distal tibia and fibula appear normal.  IMPRESSION: 1. Large open wound on the plantar aspect of the hindfoot with an associated complex soft tissue abscess measuring approximately 3.3 x 2.9 cm. 2. Extensive osteomyelitis involving the calcaneus with numerous small bone abscesses (Brodie abscesses). 3. No definite findings for septic arthritis involving the subtalar joints.   I have spent approx 30 minutes for this patient encounter  including review of prior medical records with greater than 50% of time being face to face and coordination of their care.

## 2020-01-09 NOTE — Progress Notes (Signed)
  Subjective:  Patient ID: [name], female    DOB: 1965/11/25,  MRN: [id number]  Resting complaint bedside.  Her daughter is here.  She went for a fistula and tunneled dialysis catheter placement today.  Negative for chest pain and shortness of breath Chest pain: no Shortness of breath: no Fever: no Review of all other systems is negative Objective:   Vitals:   01/09/20 1230 01/09/20 1256  BP: (!) 133/57 (!) 137/52  Pulse: (!) 57 (!) 59  Resp: 12   Temp: (!) 97.3 F (36.3 C) 98 F (36.7 C)  SpO2: 94% 96%   General AA&O x3. Normal mood and affect.  Vascular  skin is warm and well-perfused  Neurologic Epicritic sensation grossly absent.  Dermatologic  dressing clean dry and intact  Orthopedic:  Minimal pain left lower extremity   Study Result  Narrative & Impression  CLINICAL DATA:  History of prior amputation with pain, swelling and open wound along the plantar aspect of the stump.  EXAM: MRI OF THE LEFT ANKLE WITHOUT CONTRAST  TECHNIQUE: Multiplanar, multisequence MR imaging of the ankle was performed. No intravenous contrast was administered.  COMPARISON:  Radiographs 01/03/2020  FINDINGS: There is a large open wound on the plantar aspect of the hindfoot with an associated underlying complex soft tissue abscess measuring approximately 3.3 x 2.9 cm. This extends all the way down to the calcaneus. There is associated extensive osteomyelitis involving the calcaneus with numerous small bone abscesses (Brodie abscesses). I do not see any definite findings for septic arthritis involving the subtalar joints and the talus appears intact. The tibiotalar joint is also maintained and the distal tibia and fibula appear normal.  IMPRESSION: 1. Large open wound on the plantar aspect of the hindfoot with an associated complex soft tissue abscess measuring approximately 3.3 x 2.9 cm. 2. Extensive osteomyelitis involving the calcaneus with numerous small bone  abscesses (Brodie abscesses). 3. No definite findings for septic arthritis involving the subtalar joints.   Electronically Signed   By: Marijo Sanes M.D.   On: 01/09/2020 07:40     Assessment & Plan:  Patient was evaluated and treated and all questions answered.  Chronic osteomyelitis left calcaneus, diabetic foot infection -Reviewed the MRI findings with the patient and her daughter in detail.  I discussed with him that at this point with a Chopart's amputation the foot is no longer salvageable, and she would be best served with one amputation.  I believe that she would be able to heal this faster and transition to ambulating in a prosthetic much faster than any attempt at limb salvage which likely would be futile and carry high risk of morbidity as well as need for long-term antibiotics.  She understands and amenable to this.  Dr. Doran Durand is planning to take her to the operating room tomorrow afternoon, his assistance in the care of this patient is greatly appreciated. -At this point podiatry will sign off, please do not hesitate to call if new issues or questions or concerns.  Criselda Peaches, DPM  Accessible via secure chat for questions or concerns.

## 2020-01-09 NOTE — Telephone Encounter (Signed)
Physician requesting to speak with you as soon as possible in regards to patient  Dr. Waldron Labs [phone]

## 2020-01-09 NOTE — Consult Note (Addendum)
Reason for Consult:  Left foot wound Referring Physician:  Dr. De Nurse is an 54 y.o. female.  HPI: 54 y/o female with PMH of diabetes and CKD has a nonhealing ulcer of the left foot.  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  She has been seen by podiatry for her left foot wound for the last several months.  Dr. March Rummage has consulted and reports that she has osteomyelitis of the remaining foot.  I'm asked to consult for further management of this chronic left foot condition.  She is post op from left hip fracture fixation by Dr. Lyla Glassing a week ago.  She had vascular surgery yesterday for fistula placement.  Past Medical History:  Diagnosis Date  . Anxiety 2002  . Chest tightness   . Depression 2001  . Diabetes type 1, uncontrolled ([place])    at age 64  . Diabetic neuropathy ([place])   . Essential hypertension 2015  . GERD (gastroesophageal reflux disease)    about age of 57  . Nausea and vomiting in adult   . Stroke ([place])   . Urinary frequency   . Yeast vaginitis     Past Surgical History:  Procedure Laterality Date  . ACHILLES TENDON SURGERY Left 03/10/2013   Procedure: LEFT CHOPART AMPUTATION/ LEFT TENDON ACHILLES [place];  Surgeon: Wylene Simmer, MD;  Location: [facility];  Service: Orthopedics;  Laterality: Left;  . AMPUTATION Left 06/15/2012   Procedure: AMPUTATION DIGIT;  Surgeon: Meredith Pel, MD;  Location: [facility];  Service: Orthopedics;  Laterality: Left;  Left great toe revision amputation  . ENDOMETRIAL ABLATION    . INTRAMEDULLARY (IM) NAIL INTERTROCHANTERIC Left 12/29/2019   Procedure: INTRAMEDULLARY (IM) NAIL INTERTROCHANTRIC;  Surgeon: Rod Can, MD;  Location: [facility];  Service: Orthopedics;  Laterality: Left;  . IR ANGIOGRAM EXTREMITY LEFT  12/17/2018  . IR FEM POP ART ATHERECT INC PTA MOD SED  12/17/2018  . IR RADIOLOGIST EVAL & MGMT  11/30/2018  . IR RADIOLOGIST EVAL & MGMT  12/09/2018  . IR RADIOLOGIST EVAL & MGMT  02/08/2019  . IR  US GUIDE VASC ACCESS RIGHT  12/17/2018  . LAPAROSCOPIC CHOLECYSTECTOMY  01/30/2015   [facility]     Family History  Problem Relation Age of Onset  . Diabetes Mother   . Heart attack Mother   . Heart disease Mother        before age 37  . Hypertension Mother   . Hyperlipidemia Mother   . Diabetes Father   . Heart attack Father   . Heart disease Father        before age 79  . Diabetes Sister   . Hypertension Sister     Social History:  reports that she has been smoking cigarettes. She has a 5.50 pack-year smoking history. She has never used smokeless tobacco. She reports current alcohol use. She reports that she does not use drugs.  Allergies:  Allergies  Allergen Reactions  . Lidocaine Itching  . Trazodone Swelling  . Latex Rash    Medications: I have reviewed the patient's current medications.  Results for orders placed or performed during the hospital encounter of 12/28/19 (from the past 48 hour(s))  Glucose, capillary     Status: Abnormal   Collection Time: 01/07/20  5:04 PM  Result Value Ref Range   Glucose-Capillary 229 (H) 70 - 99 mg/dL    Comment: Glucose reference range applies only to samples taken after fasting for  at least 8 hours.  Glucose, capillary     Status: Abnormal   Collection Time: 01/07/20  9:22 PM  Result Value Ref Range   Glucose-Capillary 215 (H) 70 - 99 mg/dL    Comment: Glucose reference range applies only to samples taken after fasting for at least 8 hours.  Glucose, capillary     Status: Abnormal   Collection Time: 01/08/20  7:48 AM  Result Value Ref Range   Glucose-Capillary 189 (H) 70 - 99 mg/dL    Comment: Glucose reference range applies only to samples taken after fasting for at least 8 hours.  CBC     Status: Abnormal   Collection Time: 01/08/20 12:37 PM  Result Value Ref Range   WBC 18.9 (H) 4.0 - 10.5 K/uL   RBC 3.02 (L) 3.87 - 5.11 MIL/uL   Hemoglobin 8.6 (L) 12.0 - 15.0 g/dL   HCT 25.9 (L) 36 - 46 %   MCV 85.8 80.0 -  100.0 fL   MCH 28.5 26.0 - 34.0 pg   MCHC 33.2 30.0 - 36.0 g/dL   RDW 14.9 11.5 - 15.5 %   Platelets 266 150 - 400 K/uL   nRBC 0.0 0.0 - 0.2 %    Comment: Performed at [facility] [address], [place], [place] [address]  Renal function panel     Status: Abnormal   Collection Time: 01/08/20 12:37 PM  Result Value Ref Range   Sodium 126 (L) 135 - 145 mmol/L   Potassium 4.3 3.5 - 5.1 mmol/L   Chloride 92 (L) 98 - 111 mmol/L   CO2 19 (L) 22 - 32 mmol/L   Glucose, Bld 223 (H) 70 - 99 mg/dL    Comment: Glucose reference range applies only to samples taken after fasting for at least 8 hours.   BUN 143 (H) 6 - 20 mg/dL   Creatinine, Ser 7.55 (H) 0.44 - 1.00 mg/dL   Calcium 8.2 (L) 8.9 - 10.3 mg/dL   Phosphorus 6.4 (H) 2.5 - 4.6 mg/dL   Albumin 1.7 (L) 3.5 - 5.0 g/dL   GFR, Estimated 6 (L) >60 mL/min    Comment: (NOTE) Calculated using the CKD-EPI Creatinine Equation (2021)    Anion gap 15 5 - 15    Comment: Performed at [facility] [address], [place], [place] [address]  Glucose, capillary     Status: Abnormal   Collection Time: 01/08/20 12:37 PM  Result Value Ref Range   Glucose-Capillary 180 (H) 70 - 99 mg/dL    Comment: Glucose reference range applies only to samples taken after fasting for at least 8 hours.  Glucose, capillary     Status: Abnormal   Collection Time: 01/08/20  5:59 PM  Result Value Ref Range   Glucose-Capillary 267 (H) 70 - 99 mg/dL    Comment: Glucose reference range applies only to samples taken after fasting for at least 8 hours.  Glucose, capillary     Status: Abnormal   Collection Time: 01/08/20  8:21 PM  Result Value Ref Range   Glucose-Capillary 224 (H) 70 - 99 mg/dL    Comment: Glucose reference range applies only to samples taken after fasting for at least 8 hours.  CBC     Status: Abnormal   Collection Time: 01/09/20  1:11 AM  Result Value Ref Range   WBC 18.0 (H) 4.0 - 10.5 K/uL   RBC 3.01 (L) 3.87 - 5.11 MIL/uL   Hemoglobin  8.2 (L) 12.0 -  15.0 g/dL   HCT 25.8 (L) 36 - 46 %   MCV 85.7 80.0 - 100.0 fL   MCH 27.2 26.0 - 34.0 pg   MCHC 31.8 30.0 - 36.0 g/dL   RDW 14.8 11.5 - 15.5 %   Platelets 261 150 - 400 K/uL   nRBC 0.0 0.0 - 0.2 %    Comment: Performed at [facility], [place] [address] [place], [place] [address]  Renal function panel     Status: Abnormal   Collection Time: 01/09/20  1:11 AM  Result Value Ref Range   Sodium 127 (L) 135 - 145 mmol/L   Potassium 4.2 3.5 - 5.1 mmol/L   Chloride 92 (L) 98 - 111 mmol/L   CO2 21 (L) 22 - 32 mmol/L   Glucose, Bld 216 (H) 70 - 99 mg/dL    Comment: Glucose reference range applies only to samples taken after fasting for at least 8 hours.   BUN 148 (H) 6 - 20 mg/dL   Creatinine, Ser 7.67 (H) 0.44 - 1.00 mg/dL   Calcium 8.3 (L) 8.9 - 10.3 mg/dL   Phosphorus 6.7 (H) 2.5 - 4.6 mg/dL   Albumin 1.7 (L) 3.5 - 5.0 g/dL   GFR, Estimated 6 (L) >60 mL/min    Comment: (NOTE) Calculated using the CKD-EPI Creatinine Equation (2021)    Anion gap 14 5 - 15    Comment: Performed at [facility] [address], [place], [place] [address]  Glucose, capillary     Status: Abnormal   Collection Time: 01/09/20  8:02 AM  Result Value Ref Range   Glucose-Capillary 218 (H) 70 - 99 mg/dL    Comment: Glucose reference range applies only to samples taken after fasting for at least 8 hours.  Glucose, capillary     Status: Abnormal   Collection Time: 01/09/20  8:25 AM  Result Value Ref Range   Glucose-Capillary 231 (H) 70 - 99 mg/dL    Comment: Glucose reference range applies only to samples taken after fasting for at least 8 hours.    MR ANKLE LEFT WO CONTRAST  Result Date: 01/09/2020 CLINICAL DATA:  History of prior amputation with pain, swelling and open wound along the plantar aspect of the stump. EXAM: MRI OF THE LEFT ANKLE WITHOUT CONTRAST TECHNIQUE: Multiplanar, multisequence MR imaging of the ankle was performed. No intravenous contrast was administered.  COMPARISON:  Radiographs 01/03/2020 FINDINGS: There is a large open wound on the plantar aspect of the hindfoot with an associated underlying complex soft tissue abscess measuring approximately 3.3 x 2.9 cm. This extends all the way down to the calcaneus. There is associated extensive osteomyelitis involving the calcaneus with numerous small bone abscesses (Brodie abscesses). I do not see any definite findings for septic arthritis involving the subtalar joints and the talus appears intact. The tibiotalar joint is also maintained and the distal tibia and fibula appear normal. IMPRESSION: 1. Large open wound on the plantar aspect of the hindfoot with an associated complex soft tissue abscess measuring approximately 3.3 x 2.9 cm. 2. Extensive osteomyelitis involving the calcaneus with numerous small bone abscesses (Brodie abscesses). 3. No definite findings for septic arthritis involving the subtalar joints. Electronically Signed   By: Marijo Sanes M.D.   On: 01/09/2020 07:40   DG Fluoro Guide CV Line-No Report  Result Date: 01/09/2020 Fluoroscopy was utilized by the requesting physician.  No radiographic interpretation.    ROS:  Worsening kidney function.   PE:  Blood pressure Marland Kitchen)  142/55, pulse 63, temperature 98.2 F (36.8 C), temperature source Oral, resp. rate 17, height 5\' 10"  (1.778 m), weight 95.7 kg, SpO2 97 %. wn wd female.  A and O x 4.  Mood and affect normal.  EOMI.  resp unlabored.  L foot dressed and dry.  Ulcer at the plantar anterior stump.  No evident cellulitis.  Full ROm at the knee.  5/5 strength at the quad and hamstring.  Diminished sens to LT at the stump.  Healthy skin at the proximal leg.  Assessment/Plan: Left foot osteomyelitis and multiple abscesses.  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  OR Tuesday afternoon.  NPO after midnight.  Hold blood thinners.  Pre op orders enterd.  I explained the nature of this condition to the  patient in detail.  We discussed the treatment options in detail as well.  The risks and benefits of the alternative treatment options have been discussed in detail.  The patient wishes to proceed with surgery and specifically understands risks of bleeding, infection, nerve damage, blood clots, need for additional surgery, amputation and death.  This is a SEPARATE DIAGNOSIS in the post op period for her left hip fracture.  Wylene Simmer 01/09/2020, 11:50 AM   The patient refuses surgery as scheduled for today.  She reports that she just wants to get some rest after surgery yesterday and dialysis this morning.  She wants to delay surgery a day or two.  I'll reschedule her for surgery Thursday morning.  She may eat in the meantime.

## 2020-01-09 NOTE — Progress Notes (Signed)
Rounded on patient today in correlation to transition to HD. Patient is alert and pleasant post placement of tunneled HD catheter and L arm AVF. 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 Explained to patient reason for my visit today. Ordered consult to dietician and Kidney Failure book. Patient educated at the bedside regarding care of tunneled dialysis catheter, AV fistula site care, assessment of thrill and bruit daily (given disposable stethoscope) and proper medication administration on HD days.    Patient also educated on the importance of adhering to scheduled dialysis treatments, the effects of fluid overload, hyperkalemia and hyperphosphatemia. Patient capable of re-verbalizing via teach back method. Also educated patient on services available through the interdisciplinary team in the clinic setting and the differences they will note when transitioning to the outpatient setting from the hospital. Patient with no further questions at this time. Handouts and contact information provided to patient for any further assistance. Will follow as appropriate.   Dorthey Sawyer, RN  Dialysis Nurse Coordinator Phone: [phone]

## 2020-01-09 NOTE — Progress Notes (Signed)
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  Orthopedic surgery has consulted and she is status post operative repair 10/28.    Will her work-up was significant for worsening creatinine, followed by renal, recommendation to start hemodialysis this admission, patient had TDC and AV insertion by vascular surgery 11/8, as well she does have evidence of left foot stump infection including severe osteomyelitis and abscess on MRI 11/7, has been followed by Ortho, infectious disease and podiatry, recommendation for BKA.  Subjective:  She denies any chest pain, shortness of breath, reports left foot stump pain is controlled..  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 Lyla Glassing on 10/28 -PT recommended CIR, patient agreeable, insurance authorization was unfortunately denied and now social work is attempting to look for SNF PT continues recommend rehab and bed offers have been given to the patient but they are awaiting a PASARR  Acute on Chronic Anemia of Chronic Kidney Disease, postoperative  acute blood loss anemia -Transfuse  as needed, so far received 3 units during hospital stay. -On IV iron per renal.  Foot stump abscess/severe osteomyelitis  -ID input greatly appreciated, continue with IV cefazolin . -MRI 11/7 with evidence of abscess and severe osteomyelitis, cussed with podiatry, Ortho, recommendation for BKA, plan for tomorrow .  Hyponatremia -Management per renal, should improve with dialysis  Acute Kidney Injury on Chronic Kidney Disease Stage V  -Management per renal, follow on labs this morning, likely will need to initiate hemodialysis per renal this admission. -TDC and permanent access to the vascular surgery 11/8, patient to start hemodialysis today  Hypomagnesemia -Replete as needed  Volume overload with bilateral lower extremity edema, improved  -Should improve with dialysis  Type 2 Diabetes Mellitus -Medication has been adjusted, increase Lantus to 26 units every afternoon, continue with NovoLog at 5 units before meals.  Continue with insulin sliding scale.    -CBG (last 3)  Recent Labs    01/09/20 0825 01/09/20 1225 01/09/20 1252  GLUCAP 231* 173* 169*    GERD -She has a PPI but was taking her home Dexilant which has now been sent to the pharmacy -Resume home Herbster and stop her PPI here.  Essential Hypertension -required up titration of her antihypertensive regimen for couple days neuro, finally blood pressure better -Continue with carvedilol 25 mg p.o. twice daily, amlodipine 10 mg p.o. daily -Continue to monitor blood pressures per protocol -Last blood pressure was 846/96  Obesity -Complicates overall prognosis and care -Estimated body mass index is 30.27 kg/m as calculated from the following:   Height as of this encounter: 5\' 10"  (  1.778 m).   Weight as of this encounter: 95.7 kg. -Weight Loss and Dietary Counseling given   DVT prophylaxis: Heparin Sq 5000 units q8h Code Status: FULL CODE Family Communication: No family present  at bedside  Disposition Plan: Will need SNF placement once medically stable.  Status is: Inpatient  Remains inpatient appropriate because:Unsafe d/c plan, IV treatments appropriate due to intensity of illness or inability to take PO and Inpatient level of care appropriate due to severity of illness   Dispo: The patient is from: Home              Anticipated d/c is to: SNF              Anticipated d/c date is: > 3 days              Patient currently is not medically stable to d/c.  Consultants:   Orthopedic Surgery  Nephrology  Podiatry   Infectious Diseases   Vascular surgery  Procedures: -  Intramedullary fixation, Left femur by Dr. Lyla Glassing - [place] (AV) FISTULA CREATION (Left Arm Upper) - INSERTION OF DIALYSIS CATHETER (Right Chest) Antimicrobials:  Anti-infectives (From admission, onward)   Start     Dose/Rate Route Frequency Ordered Stop   01/06/20 0400  ceFAZolin (ANCEF) IVPB 2g/100 mL premix        2 g 200 mL/hr over 30 Minutes Intravenous Every 12 hours 01/05/20 1709     01/05/20 2200  ceFAZolin (ANCEF) IVPB 2g/100 mL premix  Status:  Discontinued        2 g 200 mL/hr over 30 Minutes Intravenous Every 8 hours 01/05/20 1707 01/05/20 1709   01/04/20 1700  ceFAZolin (ANCEF) IVPB 1 g/50 mL premix  Status:  Discontinued        1 g 100 mL/hr over 30 Minutes Intravenous Every 12 hours 01/04/20 1611 01/05/20 1707   12/29/19 1930  ceFAZolin (ANCEF) IVPB 2g/100 mL premix        2 g 200 mL/hr over 30 Minutes Intravenous Every 6 hours 12/29/19 1620 12/30/19 0201   12/29/19 1300  ceFAZolin (ANCEF) IVPB 2g/100 mL premix        2 g 200 mL/hr over 30 Minutes Intravenous On call to O.R. 12/29/19 0113 12/29/19 1325        Objective: Vitals:   01/09/20 1200 01/09/20 1215 01/09/20 1230 01/09/20 1256  BP: (!) 132/52 (!) 135/55 (!) 133/57 (!) 137/52  Pulse: (!) 54 (!) 55 (!) 57 (!) 59  Resp: (!) 23 19 12    Temp:   (!) 97.3 F (36.3 C) 98 F (36.7 C)   TempSrc:    Oral  SpO2: 92% 94% 94% 96%  Weight:      Height:        Intake/Output Summary (Last 24 hours) at 01/09/2020 1541 Last data filed at 01/09/2020 1301 Gross per 24 hour  Intake 440 ml  Output 1035 ml  Net -595 ml   Filed Weights   12/29/19 1147 01/05/20 1124 01/06/20 0430  Weight: 88.6 kg 95 kg 95.7 kg   Examination: Physical Exam:  Awake Alert, Oriented X 3, No new F.N deficits, Normal affect Symmetrical Chest wall movement, Good air movement bilaterally, CTAB RRR,No Gallops,Rubs or new Murmurs, No Parasternal Heave +ve B.Sounds, Abd Soft, No tenderness, No rebound - guarding or rigidity. No Cyanosis, Clubbing, in the right lower extremity, left lower extremity bandaged.  Data Reviewed: I have personally reviewed following labs and imaging studies  CBC: Recent  Labs  Lab 01/05/20 0523 01/06/20 0522 01/07/20 0107 01/08/20 1237 01/09/20 0111  WBC 34.7* 21.2* 15.6* 18.9* 18.0*  NEUTROABS 30.5* 17.9* 12.5*  --   --   HGB 7.6* 7.1* 7.3* 8.6* 8.2*  HCT 23.8* 22.6* 22.5* 25.9* 25.8*  MCV 88.5 89.0 86.9 85.8 85.7  PLT 241 210 231 266 270   Basic Metabolic Panel: Recent Labs  Lab 01/05/20 0523 01/06/20 0522 01/07/20 0107 01/08/20 1237 01/09/20 0111  NA 125* 126* 127* 126* 127*  K 4.7 3.9 4.2 4.3 4.2  CL 96* 93* 92* 92* 92*  CO2 16* 20* 22 19* 21*  GLUCOSE 352* 248* 193* 223* 216*  BUN 119* 123* 134* 143* 148*  CREATININE 6.76* 6.64* 7.33* 7.55* 7.67*  CALCIUM 8.0* 7.4* 7.7* 8.2* 8.3*  MG 1.5* 1.5* 1.6*  --   --   PHOS 6.0* 5.5* 5.8* 6.4* 6.7*   GFR: Estimated Creatinine Clearance: 10.8 mL/min (A) (by C-G formula based on SCr of 7.67 mg/dL (H)). Liver Function Tests: Recent Labs  Lab 01/05/20 0523 01/06/20 0522 01/07/20 0107 01/08/20 1237 01/09/20 0111  AST 22 31 21   --   --   ALT 12 12 6   --   --   ALKPHOS 216* 223* 223*  --   --   BILITOT 1.0 0.9 0.7  --   --   PROT 6.2* 5.8* 5.8*  --   --   ALBUMIN 2.1* 1.9* 1.7* 1.7* 1.7*   No results  for input(s): LIPASE, AMYLASE in the last 168 hours. No results for input(s): AMMONIA in the last 168 hours. Coagulation Profile: No results for input(s): INR, PROTIME in the last 168 hours. Cardiac Enzymes: No results for input(s): CKTOTAL, CKMB, CKMBINDEX, TROPONINI in the last 168 hours. BNP (last 3 results) No results for input(s): PROBNP in the last 8760 hours. HbA1C: No results for input(s): HGBA1C in the last 72 hours. CBG: Recent Labs  Lab 01/08/20 2021 01/09/20 0802 01/09/20 0825 01/09/20 1225 01/09/20 1252  GLUCAP 224* 218* 231* 173* 169*   Lipid Profile: No results for input(s): CHOL, HDL, LDLCALC, TRIG, CHOLHDL, LDLDIRECT in the last 72 hours. Thyroid Function Tests: No results for input(s): TSH, T4TOTAL, FREET4, T3FREE, THYROIDAB in the last 72 hours. Anemia Panel: No results for input(s): VITAMINB12, FOLATE, FERRITIN, TIBC, IRON, RETICCTPCT in the last 72 hours. Sepsis Labs: No results for input(s): PROCALCITON, LATICACIDVEN in the last 168 hours.  Recent Results (from the past 240 hour(s))  Culture, blood (routine x 2)     Status: None   Collection Time: 01/04/20  5:43 PM   Specimen: BLOOD LEFT HAND  Result Value Ref Range Status   Specimen Description   Final    BLOOD LEFT HAND Performed at [facility] [address] [place], [place] [address]    Special Requests   Final    BOTTLES DRAWN AEROBIC AND ANAEROBIC Blood Culture adequate volume Performed at [facility] [address] [place], [place] [address]    Culture   Final    NO GROWTH 5 DAYS Performed at [facility], [place] [address], [place], [place] [address]    Report Status 01/09/2020 FINAL  Final  Culture, blood (routine x 2)     Status: None   Collection Time: 01/04/20  5:51 PM   Specimen: BLOOD  Result Value Ref Range Status   Specimen Description   Final    BLOOD RIGHT ANTECUBITAL Performed at [facility], [address] [place], [place] [address]    Special Requests   Final    BOTTLES DRAWN AEROBIC ONLY Blood Culture results may not be optimal due to an inadequate volume of blood received in culture bottles Performed at [facility] [address] [place], [place] [address]    Culture   Final    NO GROWTH 5 DAYS Performed at [facility], [place] [address], [place], [place] [address]    Report Status 01/09/2020 FINAL  Final     RN Pressure Injury Documentation:     Estimated body mass index is 30.27 kg/m as calculated from the following:   Height as of this encounter: 5\' 10"  (1.778 m).   Weight as of this encounter: 95.7 kg.  Malnutrition Type:  Nutrition Problem: Increased nutrient needs Etiology: post-op healing   Malnutrition Characteristics:  Signs/Symptoms: estimated needs   Nutrition Interventions:  Interventions: Other (Comment) (rena-vite, prosource plus)   Radiology Studies: DG Chest 1 View  Result Date: 01/09/2020 CLINICAL DATA:  Central line placement EXAM: CHEST  1 VIEW COMPARISON:  12/28/2019 FINDINGS: Interval placement of right internal jugular approach dual lumen catheter with distal tip terminating at the level of the superior cavoatrial junction. Heart size is mildly enlarged. No focal airspace consolidation. No pleural effusion or pneumothorax. IMPRESSION: Interval placement of right internal jugular approach dual lumen catheter. No pneumothorax. Electronically Signed   By: Davina Poke D.O.   On: 01/09/2020 13:40   MR ANKLE LEFT WO CONTRAST  Result Date: 01/09/2020 CLINICAL DATA:  History of prior amputation with pain, swelling and open wound along the plantar aspect of the stump. EXAM: MRI OF THE LEFT ANKLE WITHOUT CONTRAST TECHNIQUE: Multiplanar, multisequence MR imaging of the ankle was performed. No intravenous contrast was administered. COMPARISON:  Radiographs 01/03/2020 FINDINGS: There is a large open wound on the plantar aspect of the  hindfoot with an associated underlying complex soft tissue abscess measuring approximately 3.3 x 2.9 cm. This extends all the way down to the calcaneus. There is associated extensive osteomyelitis involving the calcaneus with numerous small bone abscesses (Brodie abscesses). I do not see any definite findings for septic arthritis involving the subtalar joints and the talus appears intact. The tibiotalar joint is also maintained and the distal tibia and fibula appear normal. IMPRESSION: 1. Large open wound on the plantar aspect of the hindfoot with an associated complex soft tissue abscess measuring approximately 3.3 x 2.9 cm. 2. Extensive osteomyelitis involving the calcaneus with numerous small bone abscesses (Brodie abscesses). 3. No definite findings for septic arthritis involving the subtalar joints. Electronically Signed   By: Marijo Sanes M.D.   On: 01/09/2020 07:40   DG Fluoro Guide CV Line-No Report  Result Date: 01/09/2020 Fluoroscopy was utilized by the requesting physician.  No radiographic interpretation.   Scheduled Meds: . (feeding supplement) PROSource Plus  30 mL Oral TID BM  . sodium chloride   Intravenous Once  . acetaminophen  650 mg Oral Q6H  . amLODipine  10 mg Oral Daily  . atorvastatin  40 mg Oral q1800  . carvedilol  25 mg Oral BID WC  . Chlorhexidine Gluconate Cloth  6 each Topical Q0600  . collagenase   Topical Daily  . dexlansoprazole  60 mg Oral Daily  . docusate sodium  100 mg Oral BID  . DULoxetine  60 mg Oral Daily  . heparin injection (subcutaneous)  5,000 Units Subcutaneous Q8H  . hydrALAZINE  100 mg Oral TID  .  insulin aspart  0-5 Units Subcutaneous QHS  . insulin aspart  0-9 Units Subcutaneous TID WC  . insulin aspart  5 Units Subcutaneous TID WC  . insulin glargine  26 Units Subcutaneous QHS  . kidney failure book   Does not apply Once  . multivitamin  1 tablet Oral QHS  . sodium bicarbonate  1,300 mg Oral BID   Continuous Infusions: . sodium chloride  250 mL (01/08/20 1516)  . sodium chloride 10 mL/hr at 01/09/20 0953  .  ceFAZolin (ANCEF) IV 2 g (01/09/20 0413)    LOS: 12 days   Phillips Climes, MD [facility] PAGER is on [place]  If 7PM-7AM, please contact night-coverage [email or web address]

## 2020-01-09 NOTE — Telephone Encounter (Signed)
On call - Dr. Sherryle Lis to call back

## 2020-01-09 NOTE — OR Nursing (Signed)
Pt in room, placed back on tele monitoring, verified with primary floor nurse and tele room.

## 2020-01-09 NOTE — Progress Notes (Signed)
   ASSESSMENT & PLAN:  [name] is a 54 y.o. female with CKD 5 nearing end-stage renal disease. Plan left upper extremity AV fistula today.  Will place [facility] as well. I discussed risks, benefits, and alternatives with the patient.  She is understanding and wishes to proceed.  SUBJECTIVE:  No complaints this morning  OBJECTIVE:  BP (!) 142/55 (BP Location: Right Arm)   Pulse 63   Temp 98.2 F (36.8 C) (Oral)   Resp 17   Ht 5\' 10"  (1.778 m)   Wt 95.7 kg   SpO2 97%   BMI 30.27 kg/m   Intake/Output Summary (Last 24 hours) at 01/09/2020 0830 Last data filed at 01/09/2020 0500 Gross per 24 hour  Intake 240 ml  Output 200 ml  Net 40 ml    Constitutional: Well appearing.  No acute distress. Cardiac: Regular rate and rhythm. Vascular: Left upper extremity free from venipuncture.  CBC Latest Ref Rng & Units 01/09/2020 01/08/2020 01/07/2020  WBC 4.0 - 10.5 K/uL 18.0(H) 18.9(H) 15.6(H)  Hemoglobin 12.0 - 15.0 g/dL 8.2(L) 8.6(L) 7.3(L)  Hematocrit 36 - 46 % 25.8(L) 25.9(L) 22.5(L)  Platelets 150 - 400 K/uL 261 266 231     CMP Latest Ref Rng & Units 01/09/2020 01/08/2020 01/07/2020  Glucose 70 - 99 mg/dL 216(H) 223(H) 193(H)  BUN 6 - 20 mg/dL 148(H) 143(H) 134(H)  Creatinine 0.44 - 1.00 mg/dL 7.67(H) 7.55(H) 7.33(H)  Sodium 135 - 145 mmol/L 127(L) 126(L) 127(L)  Potassium 3.5 - 5.1 mmol/L 4.2 4.3 4.2  Chloride 98 - 111 mmol/L 92(L) 92(L) 92(L)  CO2 22 - 32 mmol/L 21(L) 19(L) 22  Calcium 8.9 - 10.3 mg/dL 8.3(L) 8.2(L) 7.7(L)  Total Protein 6.5 - 8.1 g/dL - - 5.8(L)  Total Bilirubin 0.3 - 1.2 mg/dL - - 0.7  Alkaline Phos 38 - 126 U/L - - 223(H)  AST 15 - 41 U/L - - 21  ALT 0 - 44 U/L - - 6    Estimated Creatinine Clearance: 10.8 mL/min (A) (by C-G formula based on SCr of 7.67 mg/dL (H)).  Yevonne Aline. Stanford Breed, MD [facility] Phone Number: [phone] 01/09/2020 8:30 AM

## 2020-01-09 NOTE — Anesthesia Procedure Notes (Signed)
Procedure Name: LMA Insertion Date/Time: 01/09/2020 10:03 AM Performed by: Renato Shin, CRNA Pre-anesthesia Checklist: Patient identified, Emergency Drugs available, Suction available and Patient being monitored Patient Re-evaluated:Patient Re-evaluated prior to induction Oxygen Delivery Method: Circle system utilized Preoxygenation: Pre-oxygenation with 100% oxygen Induction Type: IV induction LMA: LMA inserted LMA Size: 4.0 Number of attempts: 1 Placement Confirmation: positive ETCO2 and breath sounds checked- equal and bilateral Tube secured with: Tape Dental Injury: Teeth and Oropharynx as per pre-operative assessment

## 2020-01-09 NOTE — OR Nursing (Signed)
Pt is awake,alert and oriented but sleepy.Pt and/or family verbalized understanding of poc and discharge instructions. Reviewed admission and on going care with receiving RN. Pt is in NAD at this time and is ready to be transferred to floor. Will con't to monitor until pt is transferred.

## 2020-01-09 NOTE — Anesthesia Postprocedure Evaluation (Signed)
Anesthesia Post Note  Patient: [name]  Procedure(s) Performed: LEFT UPPER ARM ARTERIOVENOUS (AV) FISTULA CREATION (Left Arm Upper) INSERTION OF DIALYSIS CATHETER (Right Chest)     Patient location during evaluation: PACU Anesthesia Type: General Level of consciousness: awake and alert Pain management: pain level controlled Vital Signs Assessment: post-procedure vital signs reviewed and stable Respiratory status: spontaneous breathing, nonlabored ventilation, respiratory function stable and patient connected to nasal cannula oxygen Cardiovascular status: blood pressure returned to baseline and stable Postop Assessment: no apparent nausea or vomiting Anesthetic complications: no   No complications documented.  Last Vitals:  Vitals:   01/09/20 1230 01/09/20 1256  BP: (!) 133/57 (!) 137/52  Pulse: (!) 57 (!) 59  Resp: 12   Temp: (!) 36.3 C 36.7 C  SpO2: 94% 96%    Last Pain:  Vitals:   01/09/20 1256  TempSrc: Oral  PainSc:                  [name]

## 2020-01-09 NOTE — Progress Notes (Signed)
[facility] Progress Note  Subjective: s/p RIJ TDC and L BC AVF today - feeling fine post op just tired.  Says will need additional amputation L foot now.    Denies uremic symptoms.  Vitals:   01/09/20 1200 01/09/20 1215 01/09/20 1230 01/09/20 1256  BP: (!) 132/52 (!) 135/55 (!) 133/57 (!) 137/52  Pulse: (!) 54 (!) 55 (!) 57 (!) 59  Resp: (!) '23 19 12   ' Temp:   (!) 97.3 F (36.3 C) 98 F (36.7 C)  TempSrc:    Oral  SpO2: 92% 94% 94% 96%  Weight:      Height:        Exam: Gen alert, no distress MMM No jvd or bruits Chest clear bilat to bases, no rales RRR no MRG Abd soft ntnd no mass or ascites +bs Ext trace ankle edema, L foot dressing intact Neuro is alert, Ox 3 , nf RIJ TDC c/d/i, L AC fossa incision c/d/i, L radial pulse strong and hand is warm    Home meds:  - norvasc 5/ coreg 6.25/ asa / lipitor / hydralazine 50 tid  - insulin lantus 50u/ aspart 10u tid  - oxy IR prn/ cymbalta qd  - dexilant/ flexeril prn/ protonix 40     CXR - no acute disease     Last creat's outpatient >>      Aug 2021 - creat 4.9      Dec 23, 2019 - creat 5.2        Assessment/ Plan: 1. AoCKD 5 - b/l creat 4.9- 5.2, eGFR 9- 11 from 8/21- 10/21. Pt f/b Dr Posey Pronto. Creat 6 on admission, now mid 7's, BUN ^^. IVF's given w/o improvement.  Suspect infection contributing. Will need HD here - s/p TDC and perm access today 11/8 --> due to dialysis census staffing we will do her 1st HD treatment tomorrow AM.  2. L hip fracture - admission diagnosis. sp ORIF on 10/28 per ortho 3. Met acidosis - better, cont bicarb 2 bid 4. Anemia ckd - Hb 7.6, was 6.3 on admit > got 2u prbc's, peak Hb 9 but now back in the 7-8's range. Not symptomatic. Tsat low 10%, got 540m dose Feraheme on 11/06.  5. 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 Seen by ID, ancef dose adjusted -- per pt plans for further amputation after MRI findings reviewed 6. HTN/ vol  -  BP's are stable / normal on her 3 home meds, vol excess per exam 7. DM - on insulin, per pmd     [name] [facility] Pager [phone]  Recent Labs  Lab 01/08/20 1237 01/09/20 0111  K 4.3 4.2  BUN 143* 148*  CREATININE 7.55* 7.67*  CALCIUM 8.2* 8.3*  PHOS 6.4* 6.7*  HGB 8.6* 8.2*   Inpatient medications: . (feeding supplement) PROSource Plus  30 mL Oral TID BM  . sodium chloride   Intravenous Once  . acetaminophen  650 mg Oral Q6H  . amLODipine  10 mg Oral Daily  . atorvastatin  40 mg Oral q1800  . carvedilol  25 mg Oral BID WC  . Chlorhexidine Gluconate Cloth  6 each Topical Q0600  . collagenase   Topical Daily  . dexlansoprazole  60 mg Oral Daily  . docusate sodium  100 mg Oral BID  . DULoxetine  60 mg Oral Daily  . heparin injection (subcutaneous)  5,000 Units Subcutaneous Q8H  .  hydrALAZINE  100 mg Oral TID  . insulin aspart  0-5 Units Subcutaneous QHS  . insulin aspart  0-9 Units Subcutaneous TID WC  . insulin aspart  5 Units Subcutaneous TID WC  . insulin glargine  26 Units Subcutaneous QHS  . multivitamin  1 tablet Oral QHS  . sodium bicarbonate  1,300 mg Oral BID   . sodium chloride 250 mL (01/08/20 1516)  . sodium chloride 10 mL/hr at 01/09/20 0953  .  ceFAZolin (ANCEF) IV 2 g (01/09/20 0413)   sodium chloride, alum & mag hydroxide-simeth, calcium carbonate, cyclobenzaprine, guaiFENesin-dextromethorphan, hydrALAZINE, HYDROcodone-acetaminophen, lip balm, loratadine, menthol-cetylpyridinium **OR** phenol, metoCLOPramide **OR** metoCLOPramide (REGLAN) injection, morphine injection, Muscle Rub, ondansetron **OR** ondansetron (ZOFRAN) IV, oxyCODONE, polyvinyl alcohol, senna-docusate, sodium chloride

## 2020-01-09 NOTE — Op Note (Signed)
DATE OF SERVICE: 01/09/2020  PATIENT:  [name]  54 y.o. female  PRE-OPERATIVE DIAGNOSIS:  CKD5 nearing end-stage renal disease  POST-OPERATIVE DIAGNOSIS: Same  PROCEDURE:   1.  Tunneled dialysis catheter placement (right internal jugular, 19 cm catheter) 2.  Left upper extremity brachiocephalic AV fistula creation  SURGEON:  Surgeon(s) and Role:    * Cherre Robins, MD - Primary  ASSISTANT: Gaye Alken, RNFA  ANESTHESIA:   local and general  EBL:  35 mL   BLOOD ADMINISTERED:none  DRAINS: none   LOCAL MEDICATIONS USED:  BUPIVICAINE   SPECIMEN:  none  DISPOSITION OF SPECIMEN:  n/a  COUNTS: confirmed correct.  TOURNIQUET:  * No tourniquets in log *  PLAN OF CARE: return to patient room  PATIENT DISPOSITION:  PACU - hemodynamically stable.   Delay start of Pharmacological VTE agent (>24hrs) due to surgical blood loss or risk of bleeding: no  INDICATION FOR PROCEDURE: [name] is a 54 y.o. female with CKDV approaching ESRD. After careful discussion of risks, benefits, and alternatives the patient was offered [facility] placement and creation of AV fistula. We specifically discussed risk of steal syndrome. The patient understood and wished to proceed.  DESCRIPTION OF PROCEDURE: After identification of the patient in the pre-operative holding area, the patient was transferred to the operating room. The patient was positioned supine on the operating room table. Anesthesia was induced. The left arm was prepped and draped in standard fashion. A surgical pause was performed confirming correct patient, procedure, and operative location.  Using ultrasound the right jugular vein was identified.  A small stab incision was made over this.  A needle was introduced into the the jugular vein and venous blood return.  An 035 J-wire was advanced into the inferior vena cava under fluoroscopic guidance.  The tract was serially dilated under fluoroscopic guidance to ensure no  vascular injury occurred.  A peel-away sheath was then introduced into the superior vena cava.  A 19 cm tunneled dialysis catheter was tunneled from an small stab incision on the right chest over the clavicle into the jugular wound.  Catheter was fed through the peel-away sheath.  Fluoroscopy was used to confirm adequate positioning.  No kinks were seen.  The catheter was sutured to the chest wall using 3-0 nylon.  The neck incision was closed with 4-0 Monocryl.  Using intraoperative ultrasound the cephalic vein was mapped and found to be adequate for creation of autogenous access.  Transverse incision was made over the course of the distal cephalic vein and brachial artery.  Incision was carried down and the cephalic vein identified.  This was skeletonized proximally to mobilize the vein for fistula creation.  The aponeurosis of the biceps was divided and the brachial artery exposed in the typical position.  Brachial artery was skeletonized and encircled with Silastic Vesseloops.  5000 units of IV heparin were administered.  Cephalic vein was divided distally and the distal stump ligated with a 2-0 silk ligature.  The transected cephalic vein was dilated with a mosquito clamp.  Serafin was applied to the vein.  An anterior arteriotomy was made on the brachial artery measuring 6 mm.  A stay suture was placed of 6-0 Prolene to facilitate creation of the end-to-side anastomosis.  The cephalic vein was anastomosed to the brachial artery then to side fashion using running suture of 6-0 Prolene.  Medially prior to completion the ecchymosis the artery was de-aired and backward flushed.  The anastomosis was completed and  the Vesseloops were released on the brachial artery.  A soft thrill was palpable at the anastomosis.  The edematous arm did not permit palpation of the thrill in the upper arm.  There is an excellent bruit auscultated by Doppler which was marked.  The incision was closed using 3-0 Vicryl and 4-0  Monocryl.  Benzoin and Steri-Strips were applied.  A clean dressing was applied.  Upon completion of the case instrument and sharps counts were confirmed correct. The patient was transferred to the PACU in good condition. I was present for all portions of the procedure.  Yevonne Aline. Stanford Breed, MD [facility] Phone Number: [phone] 01/09/2020 11:46 AM

## 2020-01-09 NOTE — Anesthesia Preprocedure Evaluation (Signed)
Anesthesia Evaluation  Patient identified by MRN, date of birth, ID band Patient awake    Reviewed: Allergy & Precautions, NPO status , Patient's Chart, lab work & pertinent test results  Airway Mallampati: II  TM Distance: >3 FB Neck ROM: Full    Dental  (+) Dental Advisory Given   Pulmonary Current Smoker and Patient abstained from smoking.,    breath sounds clear to auscultation       Cardiovascular hypertension, Pt. on medications and Pt. on home beta blockers  Rhythm:Regular Rate:Normal     Neuro/Psych  Headaches, CVA    GI/Hepatic Neg liver ROS, GERD  ,  Endo/Other  diabetes, Type 1, Insulin Dependent  Renal/GU Renal disease     Musculoskeletal   Abdominal   Peds  Hematology  (+) anemia ,   Anesthesia Other Findings   Reproductive/Obstetrics                             Lab Results  Component Value Date   WBC 18.0 (H) 01/09/2020   HGB 8.2 (L) 01/09/2020   HCT 25.8 (L) 01/09/2020   MCV 85.7 01/09/2020   PLT 261 01/09/2020   Lab Results  Component Value Date   CREATININE 7.67 (H) 01/09/2020   BUN 148 (H) 01/09/2020   NA 127 (L) 01/09/2020   K 4.2 01/09/2020   CL 92 (L) 01/09/2020   CO2 21 (L) 01/09/2020    Anesthesia Physical Anesthesia Plan  ASA: III  Anesthesia Plan: General   Post-op Pain Management:    Induction: Intravenous  PONV Risk Score and Plan: 2 and Dexamethasone, Ondansetron and Treatment may vary due to age or medical condition  Airway Management Planned: LMA  Additional Equipment: None  Intra-op Plan:   Post-operative Plan: Extubation in OR  Informed Consent: I have reviewed the patients History and Physical, chart, labs and discussed the procedure including the risks, benefits and alternatives for the proposed anesthesia with the patient or authorized representative who has indicated his/her understanding and acceptance.     Dental advisory  given  Plan Discussed with: CRNA  Anesthesia Plan Comments:         Anesthesia Quick Evaluation

## 2020-01-10 ENCOUNTER — Encounter (HOSPITAL_COMMUNITY): Payer: Self-pay | Admitting: Vascular Surgery

## 2020-01-10 DIAGNOSIS — L97329 Non-pressure chronic ulcer of left ankle with unspecified severity: Secondary | ICD-10-CM | POA: Diagnosis not present

## 2020-01-10 DIAGNOSIS — D649 Anemia, unspecified: Secondary | ICD-10-CM | POA: Diagnosis not present

## 2020-01-10 DIAGNOSIS — D62 Acute posthemorrhagic anemia: Secondary | ICD-10-CM | POA: Diagnosis not present

## 2020-01-10 DIAGNOSIS — M869 Osteomyelitis, unspecified: Secondary | ICD-10-CM | POA: Diagnosis not present

## 2020-01-10 DIAGNOSIS — E872 Acidosis: Secondary | ICD-10-CM | POA: Diagnosis not present

## 2020-01-10 DIAGNOSIS — L02612 Cutaneous abscess of left foot: Secondary | ICD-10-CM | POA: Diagnosis not present

## 2020-01-10 DIAGNOSIS — E8770 Fluid overload, unspecified: Secondary | ICD-10-CM | POA: Diagnosis not present

## 2020-01-10 DIAGNOSIS — L02416 Cutaneous abscess of left lower limb: Secondary | ICD-10-CM | POA: Diagnosis not present

## 2020-01-10 DIAGNOSIS — N186 End stage renal disease: Secondary | ICD-10-CM | POA: Diagnosis not present

## 2020-01-10 DIAGNOSIS — N179 Acute kidney failure, unspecified: Secondary | ICD-10-CM | POA: Diagnosis not present

## 2020-01-10 DIAGNOSIS — E1129 Type 2 diabetes mellitus with other diabetic kidney complication: Secondary | ICD-10-CM | POA: Diagnosis not present

## 2020-01-10 DIAGNOSIS — T148XXA Other injury of unspecified body region, initial encounter: Secondary | ICD-10-CM | POA: Diagnosis not present

## 2020-01-10 DIAGNOSIS — E1165 Type 2 diabetes mellitus with hyperglycemia: Secondary | ICD-10-CM | POA: Diagnosis not present

## 2020-01-10 DIAGNOSIS — N185 Chronic kidney disease, stage 5: Secondary | ICD-10-CM | POA: Diagnosis not present

## 2020-01-10 DIAGNOSIS — M86672 Other chronic osteomyelitis, left ankle and foot: Secondary | ICD-10-CM | POA: Diagnosis not present

## 2020-01-10 DIAGNOSIS — E871 Hypo-osmolality and hyponatremia: Secondary | ICD-10-CM | POA: Diagnosis not present

## 2020-01-10 DIAGNOSIS — L03116 Cellulitis of left lower limb: Secondary | ICD-10-CM | POA: Diagnosis not present

## 2020-01-10 DIAGNOSIS — L97524 Non-pressure chronic ulcer of other part of left foot with necrosis of bone: Secondary | ICD-10-CM | POA: Diagnosis not present

## 2020-01-10 DIAGNOSIS — I120 Hypertensive chronic kidney disease with stage 5 chronic kidney disease or end stage renal disease: Secondary | ICD-10-CM | POA: Diagnosis not present

## 2020-01-10 DIAGNOSIS — Z20822 Contact with and (suspected) exposure to covid-19: Secondary | ICD-10-CM | POA: Diagnosis not present

## 2020-01-10 DIAGNOSIS — S72142A Displaced intertrochanteric fracture of left femur, initial encounter for closed fracture: Secondary | ICD-10-CM | POA: Diagnosis not present

## 2020-01-10 DIAGNOSIS — E118 Type 2 diabetes mellitus with unspecified complications: Secondary | ICD-10-CM | POA: Diagnosis not present

## 2020-01-10 LAB — RENAL FUNCTION PANEL
Albumin: 1.7 g/dL — ABNORMAL LOW (ref 3.5–5.0)
Anion gap: 14 (ref 5–15)
BUN: 154 mg/dL — ABNORMAL HIGH (ref 6–20)
CO2: 20 mmol/L — ABNORMAL LOW (ref 22–32)
Calcium: 8.6 mg/dL — ABNORMAL LOW (ref 8.9–10.3)
Chloride: 92 mmol/L — ABNORMAL LOW (ref 98–111)
Creatinine, Ser: 7.9 mg/dL — ABNORMAL HIGH (ref 0.44–1.00)
GFR, Estimated: 6 mL/min — ABNORMAL LOW (ref >60)
Glucose, Bld: 320 mg/dL — ABNORMAL HIGH (ref 70–99)
Phosphorus: 7.8 mg/dL — ABNORMAL HIGH (ref 2.5–4.6)
Potassium: 4.8 mmol/L (ref 3.5–5.1)
Sodium: 126 mmol/L — ABNORMAL LOW (ref 135–145)

## 2020-01-10 LAB — HEPATITIS B CORE ANTIBODY, TOTAL
Hep B Core Total Ab: NONREACTIVE
Hep B Core Total Ab: NONREACTIVE

## 2020-01-10 LAB — CBC
HCT: 27.5 % — ABNORMAL LOW (ref 36.0–46.0)
Hemoglobin: 8.9 g/dL — ABNORMAL LOW (ref 12.0–15.0)
MCH: 28 pg (ref 26.0–34.0)
MCHC: 32.4 g/dL (ref 30.0–36.0)
MCV: 86.5 fL (ref 80.0–100.0)
Platelets: 328 10*3/uL (ref 150–400)
RBC: 3.18 MIL/uL — ABNORMAL LOW (ref 3.87–5.11)
RDW: 14.8 % (ref 11.5–15.5)
WBC: 19.5 10*3/uL — ABNORMAL HIGH (ref 4.0–10.5)
nRBC: 0 % (ref 0.0–0.2)

## 2020-01-10 LAB — HEPATITIS B SURFACE ANTIGEN
Hepatitis B Surface Ag: NONREACTIVE
Hepatitis B Surface Ag: NONREACTIVE

## 2020-01-10 LAB — GLUCOSE, CAPILLARY
Glucose-Capillary: 197 mg/dL — ABNORMAL HIGH (ref 70–99)
Glucose-Capillary: 254 mg/dL — ABNORMAL HIGH (ref 70–99)
Glucose-Capillary: 300 mg/dL — ABNORMAL HIGH (ref 70–99)

## 2020-01-10 LAB — HEPATITIS B SURFACE ANTIBODY,QUALITATIVE
Hep B S Ab: NONREACTIVE
Hep B S Ab: NONREACTIVE

## 2020-01-10 MED ORDER — CEFAZOLIN SODIUM-DEXTROSE 1-4 GM/50ML-% IV SOLN
1.0000 g | INTRAVENOUS | Status: DC
  Administered 2020-01-10 – 2020-01-12 (×3): 1 g via INTRAVENOUS
  Administered 2020-01-12: 2 g via INTRAVENOUS
  Administered 2020-01-13: 1 g via INTRAVENOUS
  Filled 2020-01-10 (×5): qty 50

## 2020-01-10 MED ORDER — INSULIN GLARGINE 100 UNIT/ML ~~LOC~~ SOLN
30.0000 [IU] | Freq: Every day | SUBCUTANEOUS | Status: DC
  Administered 2020-01-10: 30 [IU] via SUBCUTANEOUS
  Filled 2020-01-10 (×2): qty 0.3

## 2020-01-10 MED ORDER — HEPARIN SODIUM (PORCINE) 1000 UNIT/ML IJ SOLN
INTRAMUSCULAR | Status: AC
  Administered 2020-01-10: 3200 [IU] via INTRAVENOUS_CENTRAL
  Filled 2020-01-10: qty 4

## 2020-01-10 MED ORDER — DARBEPOETIN ALFA 60 MCG/0.3ML IJ SOSY
60.0000 ug | PREFILLED_SYRINGE | INTRAMUSCULAR | Status: DC
  Administered 2020-01-10: 60 ug via INTRAVENOUS

## 2020-01-10 MED ORDER — DARBEPOETIN ALFA 60 MCG/0.3ML IJ SOSY
PREFILLED_SYRINGE | INTRAMUSCULAR | Status: AC
  Filled 2020-01-10: qty 0.3

## 2020-01-10 MED ORDER — NEPRO/CARBSTEADY PO LIQD
237.0000 mL | Freq: Every day | ORAL | Status: DC
  Administered 2020-01-10: 237 mL via ORAL

## 2020-01-10 MED ORDER — SEVELAMER CARBONATE 800 MG PO TABS
800.0000 mg | ORAL_TABLET | Freq: Three times a day (TID) | ORAL | Status: DC
  Administered 2020-01-10 – 2020-01-23 (×31): 800 mg via ORAL
  Filled 2020-01-10 (×31): qty 1

## 2020-01-10 NOTE — Progress Notes (Signed)
Nutrition Follow-up  DOCUMENTATION CODES:   Not applicable  INTERVENTION:   -D/c Prosource Plus -Renal MVI daily -Nepro Shake po daily, each supplement provides 425 kcal and 19 grams protein -Provided education on real diet. Provided "Food Pyramid for Healthy Eating with Kidney Disease" handout from [facility] Nutrition Care Manual  NUTRITION DIAGNOSIS:   Increased nutrient needs related to post-op healing as evidenced by estimated needs.  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 She presented to the ED from home via EMS after falling onto her L hip on concrete.  10/28- s/p Intramedullary fixation, Left femur. 11/8- s/p PROCEDURE:   1.  Tunneled dialysis catheter placement (right internal jugular, 19 cm catheter) 2.  Left upper extremity brachiocephalic AV fistula creation  Reviewed I/O's: +354 ml x 24 hours and +3.5 L since admission  Case discussed with RN, who reports that pt does not like the Prosource supplements, however, does take them with encouragement.   Spoke with pt at bedside, who reports fair appetite. Noted pt consumed only a few bites of lunch tray (some rice and green beans). She estimates she is consuming about 50% of meals. Noted meal completion 40-70%.   PTA pt reports fair appetite, consuming 2 meals per day 9Breakfast scrambled eggs ans English muffin; Dinner: steak and baked potato). Pt shares that over the past few months that she has been making healthier food choices (eliminating fried food) and estimates she has lost 30 pounds over the past 3 months due to this. However, wt hx reveals  wt stability.   Pt shares that she had her first HD treatment today and tolerated well. She reports that HD RN had visited her and was able to teachback some of the basics of renal diet to this RD. Reviewed food groups and provided written recommended serving sizes specifically determined for patient's current nutritional status. Explained why diet restrictions are needed and provided lists of foods to limit/avoid that are high potassium, sodium, and phosphorus. Provided specific recommendations on safer alternatives of these foods. Strongly encouraged compliance of this diet. Discussed importance of protein intake at each meal and snack. Provided examples of how to maximize protein intake throughout the day. Discussed need for fluid restriction with dialysis, importance of minimizing weight gain between HD treatments, and renal-friendly beverage options.Encouraged pt to discuss specific diet questions/concerns with RD at HD outpatient facility. Teach back method used. Expect fair to good compliance.  Discussed importance of good meal and supplement intake to promote healing. Pt does not want to continue with Prostat. Reviewed alternative options on formulary; pt would like to try Nepro.   Medications reviewed and include colace, aranesp, and renvela.   Labs reviewed: Na: 126, Phos: 7.8, CBGS: 197-314 (inpatient orders for glycemic control are 0-5 units insulin aspart daily at bedtime, 0-9 units insulin aspart TID with meals, 5 units insulin aspart TID with meals, and 26 units inuslin glargine daily at bedtime).   NUTRITION - FOCUSED PHYSICAL EXAM:    Most Recent Value  Orbital Region No depletion  Upper Arm Region No depletion  Thoracic and Lumbar Region No depletion  Buccal Region No depletion  Temple Region No depletion  Clavicle Bone Region No depletion  Clavicle and Acromion Bone Region No depletion  Scapular Bone Region No depletion  Dorsal Hand No depletion  Patellar Region No depletion   Anterior Thigh Region No depletion  Posterior Calf Region No depletion  Edema (RD Assessment) None  Hair Reviewed  Eyes Reviewed  Mouth Reviewed  Skin Reviewed  Nails Reviewed       Diet Order:   Diet Order            Diet renal/carb modified with fluid restriction Diet-HS Snack? Nothing; Fluid restriction: 1200 mL Fluid; Room service appropriate? Yes; Fluid consistency: Thin  Diet effective now                 EDUCATION NEEDS:   No education needs have been identified at this time  Skin:  Skin Assessment: Skin Integrity Issues: Skin Integrity Issues:: Other (Comment) Incisions: L hip (10/28) Other: closed lt foot  Last BM:  01/08/20  Height:   Ht Readings from Last 1 Encounters:  12/28/19 5\' 10"  (1.778 m)    Weight:   Wt Readings from Last 1 Encounters:  01/10/20 94 kg   BMI:  Body mass index is 29.73 kg/m.  Estimated Nutritional Needs:   Kcal:  2100-2300  Protein:  105-120 grams  Fluid:  1000 ml + UOP    Loistine Chance, RD, LDN, [place] Registered Dietitian II Certified Diabetes Care and Education Specialist Please refer to [facility] for RD and/or RD on-call/weekend/after hours pager

## 2020-01-10 NOTE — Progress Notes (Signed)
Subjective: [name] refuses surgery today as scheduled.   Objective: Vital signs in last 24 hours: Temp:  [97.7 F (36.5 C)-98.2 F (36.8 C)] 98.2 F (36.8 C) (11/09 1122) Pulse Rate:  [57-73] 73 (11/09 1122) Resp:  [11-18] 14 (11/09 1122) BP: (137-165)/(52-67) 160/58 (11/09 1122) SpO2:  [95 %-97 %] 95 % (11/09 1122) Weight:  [94 kg-94.7 kg] 94 kg (11/09 1007)  Intake/Output from previous day: 11/08 0701 - 11/09 0700 In: 1188.9 [P.O.:540; I.V.:448.9; IV Piggyback:200] Out: 835 [Urine:800; Blood:35] Intake/Output this shift: Total I/O In: -  Out: 1000 [Other:1000]  Recent Labs    01/08/20 1237 01/09/20 0111 01/10/20 0405  HGB 8.6* 8.2* 8.9*   Recent Labs    01/09/20 0111 01/10/20 0405  WBC 18.0* 19.5*  RBC 3.01* 3.18*  HCT 25.8* 27.5*  PLT 261 328   Recent Labs    01/09/20 0111 01/10/20 0405  NA 127* 126*  K 4.2 4.8  CL 92* 92*  CO2 21* 20*  BUN 148* 154*  CREATININE 7.67* 7.90*  GLUCOSE 216* 320*  CALCIUM 8.3* 8.6*   No results for input(s): LABPT, INR in the last 72 hours.  unchanged    Assessment/Plan: L foot osteomyelitis - to the OR Thursday for L BKA.  We'll put in pre op orders tomorrow.  She may eat today.     [name] 01/10/2020, 4:18 PM

## 2020-01-10 NOTE — Progress Notes (Signed)
   ASSESSMENT & PLAN:  [name] is a 54 y.o. female with end-stage renal disease status post left brachiocephalic AV fistula creation and right IJ tunneled dialysis catheter placement.  Catheter appears to be working well on dialysis today.  There is a thrill in the fistula.  She is planned to undergo below-knee amputation with orthopedics later today.  We will follow.  Please call for any questions.  SUBJECTIVE:  No complaints this morning.  On the dialysis circuit.  Left hand is warm.  No pain in the hand.  OBJECTIVE:  BP (!) 151/56   Pulse 61   Temp 98.1 F (36.7 C) (Oral)   Resp 11   Ht 5\' 10"  (1.778 m)   Wt 94.7 kg   SpO2 97%   BMI 29.96 kg/m   Intake/Output Summary (Last 24 hours) at 01/10/2020 0802 Last data filed at 01/10/2020 0510 Gross per 24 hour  Intake 1188.93 ml  Output 835 ml  Net 353.93 ml    Constitutional: No acute distress. Cardiac: Regular rate and rhythm. Vascular: Palpable left radial pulse.  Palpable thrill in left upper extremity AV fistula.  Tunneled dialysis catheter in place without evidence of bleeding. Pulmonary: Unlabored  CBC Latest Ref Rng & Units 01/10/2020 01/09/2020 01/08/2020  WBC 4.0 - 10.5 K/uL 19.5(H) 18.0(H) 18.9(H)  Hemoglobin 12.0 - 15.0 g/dL 8.9(L) 8.2(L) 8.6(L)  Hematocrit 36 - 46 % 27.5(L) 25.8(L) 25.9(L)  Platelets 150 - 400 K/uL 328 261 266     CMP Latest Ref Rng & Units 01/10/2020 01/09/2020 01/08/2020  Glucose 70 - 99 mg/dL 320(H) 216(H) 223(H)  BUN 6 - 20 mg/dL 154(H) 148(H) 143(H)  Creatinine 0.44 - 1.00 mg/dL 7.90(H) 7.67(H) 7.55(H)  Sodium 135 - 145 mmol/L 126(L) 127(L) 126(L)  Potassium 3.5 - 5.1 mmol/L 4.8 4.2 4.3  Chloride 98 - 111 mmol/L 92(L) 92(L) 92(L)  CO2 22 - 32 mmol/L 20(L) 21(L) 19(L)  Calcium 8.9 - 10.3 mg/dL 8.6(L) 8.3(L) 8.2(L)  Total Protein 6.5 - 8.1 g/dL - - -  Total Bilirubin 0.3 - 1.2 mg/dL - - -  Alkaline Phos 38 - 126 U/L - - -  AST 15 - 41 U/L - - -  ALT 0 - 44 U/L - - -    Estimated  Creatinine Clearance: 10.4 mL/min (A) (by C-G formula based on SCr of 7.9 mg/dL (H)).  Yevonne Aline. Stanford Breed, MD [facility] Phone Number: [phone] 01/10/2020 8:02 AM

## 2020-01-10 NOTE — Care Management (Signed)
    Durable Medical Equipment  (From admission, onward)         Start     Ordered   01/10/20 1623  For home use only DME standard manual wheelchair with seat cushion  Once       Comments: Patient suffers from Intertrochanteric fracture of the proximal left femur with avulsion of the lesser trochanter status post mechanical fall which impairs their ability to perform daily activities like ambulating  in the home.  A cane will not resolve issue with performing activities of daily living. A wheelchair will allow patient to safely perform daily activities. Patient can safely propel the wheelchair in the home or has a caregiver who can provide assistance. Length of need lifetime . Accessories: elevating leg rests (ELRs), wheel locks, extensions and anti-tippers.  Seat and back cushions   01/10/20 1624   01/10/20 1621  For home use only DME 3 n 1  Once        01/10/20 1620   01/10/20 1620  For home use only DME Walker rolling  Once       Question Answer Comment  Walker: With [place]   Patient needs a walker to treat with the following condition Weakness      01/10/20 1620

## 2020-01-10 NOTE — Progress Notes (Signed)
This nurse was informed by off-going shift that patient is refusing surgery today and would like to do it later this week. Consent has not been signed at this time. Contacted [facility] to inform Dr [employer] team. He is in surgery and a message has been sent.

## 2020-01-10 NOTE — Progress Notes (Signed)
PROGRESS NOTE    [name]  VCB:[id number] DOB: May 15, 1965 DOA: 12/28/2019 PCP: Julian Hy, PA-C    Brief Narrative:   The patient 54 year old female with DM, HTN, CKD 5, status post left foot Chopart amputation 2014 managed by podiatry, left SFA revascularization in 2020, prior CVA who came into the hospital after a ground-level fall resulting in left hip fracture.  Orthopedic surgery has consulted and she is status post operative repair 10/28.    Will her work-up was significant for worsening creatinine, followed by renal, recommendation to start hemodialysis this admission, patient had TDC and AV insertion by vascular surgery 11/8, as well she does have evidence of left foot stump infection including severe osteomyelitis and abscess on MRI 11/7, has been followed by Ortho, infectious disease and podiatry, recommendation for.  Subjective:  She denies any chest pain, shortness of breath, she is still undecided about her left BKA.  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 Lyla Glassing on 10/28  Acute on Chronic Anemia of Chronic Kidney Disease, postoperative acute blood loss anemia -Transfuse  as needed, so far received 3 units during hospital stay. -On IV iron per renal.  Foot stump abscess/severe osteomyelitis  -ID input greatly appreciated, continue with IV cefazolin . -MRI 11/7 with evidence of  abscess and severe osteomyelitis,  -She has been followed by podiatry and orthopedic, she was seen by Ortho yesterday, initially consented to left BKA, but she is undecided this morning, he has been respond to later this week upon her request.  Hyponatremia -Management per renal, should improve with dialysis  Acute Kidney Injury on Chronic Kidney Disease Stage V  -Management per renal, she is started on hemodialysis. -TDC and permanent access to the vascular surgery 11/8, patient to start hemodialysis today  Hypomagnesemia -Replete as needed  Volume overload with bilateral lower extremity edema, improved  -Should improve with dialysis  Type 2 Diabetes Mellitus -CBG remains elevated, will increase Lantus to 30 units nightly, continue with sliding scale and 5 units of NovoLog before meals.   -CBG (last 3)  Recent Labs    01/09/20 1720 01/09/20 2123 01/10/20 1115  GLUCAP 245* 314* 197*    GERD -She has a PPI but was taking her home Dexilant which has now been sent to the pharmacy -Resume home [place] and stop her PPI here.  Essential Hypertension -required up titration of her antihypertensive regimen for couple days neuro, finally blood pressure better -Continue with carvedilol 25 mg p.o. twice daily, amlodipine 10 mg p.o. daily -Continue to monitor blood pressures per protocol  Obesity -Complicates overall prognosis and care -Estimated body mass index is 29.73 kg/m as calculated from the following:   Height as of this encounter: 5\' 10"  (1.778 m).   Weight as of this encounter: 94 kg. -Weight Loss and Dietary Counseling given   DVT prophylaxis: Heparin Sq 5000 units q8h Code Status: FULL CODE Family Communication: No  family present at bedside  Disposition Plan: Will need SNF placement once medically stable.  Status is: Inpatient  Remains inpatient appropriate because:Unsafe d/c plan, IV treatments appropriate due to intensity of illness or inability to take PO and  Inpatient level of care appropriate due to severity of illness   Dispo: The patient is from: Home              Anticipated d/c is to: SNF              Anticipated d/c date is: > 3 days              Patient currently is not medically stable to d/c.  Consultants:   Orthopedic Surgery  Nephrology  Podiatry   Infectious Diseases   Vascular surgery  Procedures: -  Intramedullary fixation, Left femur by Dr. Lyla Glassing - [place] (AV) FISTULA CREATION (Left Arm Upper) - INSERTION OF DIALYSIS CATHETER (Right Chest) Antimicrobials:  Anti-infectives (From admission, onward)   Start     Dose/Rate Route Frequency Ordered Stop   01/10/20 2000  ceFAZolin (ANCEF) IVPB 1 g/50 mL premix        1 g 100 mL/hr over 30 Minutes Intravenous Every 24 hours 01/10/20 1343     01/10/20 0600  ceFAZolin (ANCEF) IVPB 2g/100 mL premix        2 g 200 mL/hr over 30 Minutes Intravenous On call to O.R. 01/09/20 1952 01/11/20 0559   01/06/20 0400  ceFAZolin (ANCEF) IVPB 2g/100 mL premix  Status:  Discontinued        2 g 200 mL/hr over 30 Minutes Intravenous Every 12 hours 01/05/20 1709 01/10/20 1343   01/05/20 2200  ceFAZolin (ANCEF) IVPB 2g/100 mL premix  Status:  Discontinued        2 g 200 mL/hr over 30 Minutes Intravenous Every 8 hours 01/05/20 1707 01/05/20 1709   01/04/20 1700  ceFAZolin (ANCEF) IVPB 1 g/50 mL premix  Status:  Discontinued        1 g 100 mL/hr over 30 Minutes Intravenous Every 12 hours 01/04/20 1611 01/05/20 1707   12/29/19 1930  ceFAZolin (ANCEF) IVPB 2g/100 mL premix        2 g 200 mL/hr over 30 Minutes Intravenous Every 6 hours 12/29/19 1620 12/30/19 0201   12/29/19 1300  ceFAZolin (ANCEF) IVPB 2g/100 mL premix        2 g 200 mL/hr over 30 Minutes Intravenous On call to O.R. 12/29/19 0113 12/29/19 1325        Objective: Vitals:   01/10/20 0935 01/10/20 0950 01/10/20 1007 01/10/20 1122  BP: (!) 165/58 (!) 165/64 (!) 162/61 (!) 160/58  Pulse:    73   Resp: 14 13 16 14   Temp:   97.9 F (36.6 C) 98.2 F (36.8 C)  TempSrc:   Oral Oral  SpO2:   96% 95%  Weight:   94 kg   Height:        Intake/Output Summary (Last 24 hours) at 01/10/2020 1514 Last data filed at 01/10/2020 1007 Gross per 24 hour  Intake 988.93 ml  Output 1000 ml  Net -11.07 ml   Filed Weights   01/06/20 0430 01/10/20 0657 01/10/20 1007  Weight: 95.7 kg 94.7 kg 94 kg   Examination: Physical Exam:  Awake Alert, Oriented X 3, No new F.N deficits, Normal affect Symmetrical Chest wall movement, Good air movement bilaterally, CTAB RRR,No Gallops,Rubs or new Murmurs, No Parasternal Heave +ve B.Sounds, Abd Soft, No  tenderness, No rebound - guarding or rigidity. No Cyanosis, Clubbing, in the right lower extremity, left lower extremity bandaged.  Data Reviewed: I have personally reviewed following labs and imaging studies  CBC: Recent Labs  Lab 01/05/20 0523 01/05/20 0523 01/06/20 0522 01/07/20 0107 01/08/20 1237 01/09/20 0111 01/10/20 0405  WBC 34.7*   < > 21.2* 15.6* 18.9* 18.0* 19.5*  NEUTROABS 30.5*  --  17.9* 12.5*  --   --   --   HGB 7.6*   < > 7.1* 7.3* 8.6* 8.2* 8.9*  HCT 23.8*   < > 22.6* 22.5* 25.9* 25.8* 27.5*  MCV 88.5   < > 89.0 86.9 85.8 85.7 86.5  PLT 241   < > 210 231 266 261 328   < > = values in this interval not displayed.   Basic Metabolic Panel: Recent Labs  Lab 01/05/20 0523 01/05/20 0523 01/06/20 0522 01/07/20 0107 01/08/20 1237 01/09/20 0111 01/10/20 0405  NA 125*   < > 126* 127* 126* 127* 126*  K 4.7   < > 3.9 4.2 4.3 4.2 4.8  CL 96*   < > 93* 92* 92* 92* 92*  CO2 16*   < > 20* 22 19* 21* 20*  GLUCOSE 352*   < > 248* 193* 223* 216* 320*  BUN 119*   < > 123* 134* 143* 148* 154*  CREATININE 6.76*   < > 6.64* 7.33* 7.55* 7.67* 7.90*  CALCIUM 8.0*   < > 7.4* 7.7* 8.2* 8.3* 8.6*  MG 1.5*  --  1.5* 1.6*  --   --   --   PHOS 6.0*   < > 5.5* 5.8* 6.4* 6.7* 7.8*   < > = values in this interval not displayed.    GFR: Estimated Creatinine Clearance: 10.3 mL/min (A) (by C-G formula based on SCr of 7.9 mg/dL (H)). Liver Function Tests: Recent Labs  Lab 01/05/20 0523 01/05/20 0523 01/06/20 0522 01/07/20 0107 01/08/20 1237 01/09/20 0111 01/10/20 0405  AST 22  --  31 21  --   --   --   ALT 12  --  12 6  --   --   --   ALKPHOS 216*  --  223* 223*  --   --   --   BILITOT 1.0  --  0.9 0.7  --   --   --   PROT 6.2*  --  5.8* 5.8*  --   --   --   ALBUMIN 2.1*   < > 1.9* 1.7* 1.7* 1.7* 1.7*   < > = values in this interval not displayed.   No results for input(s): LIPASE, AMYLASE in the last 168 hours. No results for input(s): AMMONIA in the last 168 hours. Coagulation Profile: No results for input(s): INR, PROTIME in the last 168 hours. Cardiac Enzymes: No results for input(s): CKTOTAL, CKMB, CKMBINDEX, TROPONINI in the last 168 hours. BNP (last 3 results) No results for input(s): PROBNP in the last 8760 hours. HbA1C: No results for input(s): HGBA1C in the last 72 hours. CBG: Recent Labs  Lab 01/09/20 1225 01/09/20 1252 01/09/20 1720 01/09/20 2123 01/10/20 1115  GLUCAP 173* 169* 245* 314* 197*   Lipid Profile: No results for input(s): CHOL, HDL, LDLCALC, TRIG, CHOLHDL, LDLDIRECT in the last 72 hours. Thyroid Function Tests: No results for input(s): TSH, T4TOTAL, FREET4, T3FREE, THYROIDAB in the last 72 hours. Anemia Panel: No results for input(s): VITAMINB12, FOLATE, FERRITIN, TIBC, IRON, RETICCTPCT in the last 72 hours. Sepsis Labs:  No results for input(s): PROCALCITON, LATICACIDVEN in the last 168 hours.  Recent Results (from the past 240 hour(s))  Culture, blood (routine x 2)     Status: None   Collection Time: 01/04/20  5:43 PM   Specimen: BLOOD LEFT HAND  Result Value Ref Range Status   Specimen Description   Final    BLOOD LEFT HAND Performed at [facility] [address] [place], [place] [address]    Special Requests   Final    BOTTLES DRAWN  AEROBIC AND ANAEROBIC Blood Culture adequate volume Performed at [facility] [address] [place], [place] [address]    Culture   Final    NO GROWTH 5 DAYS Performed at [facility], [place] [address], [place], [place] [address]    Report Status 01/09/2020 FINAL  Final  Culture, blood (routine x 2)     Status: None   Collection Time: 01/04/20  5:51 PM   Specimen: BLOOD  Result Value Ref Range Status   Specimen Description   Final    BLOOD RIGHT ANTECUBITAL Performed at [facility] [address], [place], [place] [address]    Special Requests   Final    BOTTLES DRAWN AEROBIC ONLY Blood Culture results may not be optimal due to an inadequate volume of blood received in culture bottles Performed at [facility] [address], [place], [place] [address]    Culture   Final    NO GROWTH 5 DAYS Performed at [facility], [place] [address] [place], [place] [address]    Report Status 01/09/2020 FINAL  Final  Surgical pcr screen     Status: None   Collection Time: 01/09/20  7:57 PM   Specimen: Nasal Mucosa; Nasal Swab  Result Value Ref Range Status   MRSA, PCR NEGATIVE NEGATIVE Final   Staphylococcus aureus NEGATIVE NEGATIVE Final    Comment: (NOTE) The Xpert SA Assay (FDA approved for NASAL specimens in patients 44 years of age and older), is one component of a comprehensive surveillance program. It is not intended to diagnose infection nor to guide or monitor treatment. Performed at [facility], [place] [address] [place], [place] [address]      RN Pressure Injury Documentation:     Estimated body mass index is 29.73 kg/m as calculated from the following:   Height as of this encounter: 5\' 10"  (1.778 m).   Weight as of this encounter: 94 kg.  Malnutrition Type:  Nutrition Problem: Increased nutrient needs Etiology: post-op healing   Malnutrition Characteristics:  Signs/Symptoms: estimated  needs   Nutrition Interventions:  Interventions: Other (Comment) (rena-vite, prosource plus)   Radiology Studies: DG Chest 1 View  Result Date: 01/09/2020 CLINICAL DATA:  Central line placement EXAM: CHEST  1 VIEW COMPARISON:  12/28/2019 FINDINGS: Interval placement of right internal jugular approach dual lumen catheter with distal tip terminating at the level of the superior cavoatrial junction. Heart size is mildly enlarged. No focal airspace consolidation. No pleural effusion or pneumothorax. IMPRESSION: Interval placement of right internal jugular approach dual lumen catheter. No pneumothorax. Electronically Signed   By: Davina Poke D.O.   On: 01/09/2020 13:40   DG Fluoro Guide CV Line-No Report  Result Date: 01/09/2020 Fluoroscopy was utilized by the requesting physician.  No radiographic interpretation.   Scheduled Meds: . (feeding supplement) PROSource Plus  30 mL Oral TID BM  . sodium chloride   Intravenous Once  .  acetaminophen  650 mg Oral Q6H  . amLODipine  10 mg Oral Daily  . atorvastatin  40 mg Oral q1800  . carvedilol  25 mg Oral BID WC  . chlorhexidine  60 mL Topical Once  . Chlorhexidine Gluconate Cloth  6 each Topical Q0600  . collagenase   Topical Daily  . darbepoetin (ARANESP) injection - DIALYSIS  60 mcg Intravenous Q Tue-HD  . dexlansoprazole  60 mg Oral Daily  . docusate sodium  100 mg Oral BID  . DULoxetine  60 mg Oral Daily  . hydrALAZINE  100 mg Oral TID  . insulin aspart  0-5 Units Subcutaneous QHS  . insulin aspart  0-9 Units Subcutaneous TID WC  . insulin aspart  5 Units Subcutaneous TID WC  . insulin glargine  26 Units Subcutaneous QHS  . multivitamin  1 tablet Oral QHS  . povidone-iodine  2 application Topical Once  . sevelamer carbonate  800 mg Oral TID WC  . sodium bicarbonate  1,300 mg Oral BID   Continuous Infusions: . sodium chloride Stopped (01/09/20 0759)  . sodium chloride 10 mL/hr at 01/10/20 1510  .  ceFAZolin (ANCEF) IV    .   ceFAZolin (ANCEF) IV      LOS: 13 days   Phillips Climes, MD [facility] PAGER is on AMION  If 7PM-7AM, please contact night-coverage [email or web address]

## 2020-01-10 NOTE — Progress Notes (Signed)
I was just informed by patient that she will not push through with the surgery scheduled at 2:30 pm today and to have the surgery later in the week.  Informed patient that she should talked to Dr. Wylene Simmer who will be performing the said surgery.  Will endorse accordingly to day shift RN.

## 2020-01-10 NOTE — TOC Progression Note (Signed)
Transition of Care [facility]) - Progression Note    Patient Details  Name: [name] MRN: [id number] Date of Birth: 1965/10/19  [facility]) CM/SW Contact  Jacalyn Lefevre Edson Snowball, RN Phone Number: 01/10/2020, 4:28 PM  Clinical Narrative:     Patient prefers to go home at discharge. Planned lt BKA on Thursday , will continue to follow. Entered orders for DME. Will await post op PT evaluation after Thursday.   Cory with [facility] unable to accept.   Hoyle Sauer with [facility] following and reviewing, Has NOT accepted referral at this point .  Expected Discharge Plan: [place] Barriers to Discharge: Continued Medical Work up  Expected Discharge Plan and Services Expected Discharge Plan: [place]   Discharge Planning Services: CM Consult Post Acute Care Choice: IP Rehab Living arrangements for the past 2 months: Single Family Home                                       Social Determinants of Health (SDOH) Interventions    Readmission Risk Interventions No flowsheet data found.

## 2020-01-10 NOTE — Progress Notes (Signed)
Unable to secure Gatorade, G2 since it is not available in [facility] and was given the storeroom number instead. Tried storeroom phone number but nobody answered.

## 2020-01-10 NOTE — Progress Notes (Signed)
[facility] Progress Note  Subjective: s/p RIJ TDC and L BC AVF yesterday - no steal symptoms.  Trying to decide if she will consent to amputation.  On HD - tolerating fine.  Vitals:   01/10/20 0657 01/10/20 0708 01/10/20 0713 01/10/20 0730  BP: (!) 145/67 (!) 151/56  (!) 151/56  Pulse:      Resp: '13 13 11   ' Temp: 98.1 F (36.7 C)     TempSrc: Oral     SpO2: 97%     Weight: 94.7 kg     Height:        Exam: Gen alert, no distress MMM No jvd or bruits Chest clear bilat to bases, no rales RRR no MRG Abd soft ntnd no mass or ascites +bs Ext trace ankle edema, L foot dressing intact Neuro is alert, Ox 3 , nf RIJ TDC c/d/i, L AC fossa incision c/d/i, L radial pulse strong and hand is warm    Home meds:  - norvasc 5/ coreg 6.25/ asa / lipitor / hydralazine 50 tid  - insulin lantus 50u/ aspart 10u tid  - oxy IR prn/ cymbalta qd  - dexilant/ flexeril prn/ protonix 40     CXR - no acute disease     Last creat's outpatient >>      Aug 2021 - creat 4.9      Dec 23, 2019 - creat 5.2        Assessment/ Plan: 1. AoCKD 5 now ESRD- b/l creat 4.9- 5.2, eGFR 9- 11 from 8/21- 10/21. Pt f/b Dr Posey Pronto. Creat 6 on admission, now mid 7's, BUN ^^. IVF's given w/o improvement. S/p TDC and perm access 11/8, 1st HD 11/9, plan for #2 tomorrow.  Will CLIP to outpt.  2. L hip fracture - admission diagnosis. sp ORIF on 10/28 per ortho 3. Met acidosis - better, cont bicarb 2 bid - d/c once on full HD. 4. Anemia ckd - Hb 7.6, was 6.3 on admit > got 2u prbc's, Hb in 8-9s now. Not symptomatic. Tsat low 10%, got 571m dose Feraheme on 11/06. Start ESA.  5. 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 Seen by ID, ancef dose adjusted -- per pt plans for further amputation after MRI findings reviewed but she is trying to decide if she will consent for it. 6. HTN/ vol  - BP's are stable / normal on her 3 home meds, vol excess per exam 7. DM - on  insulin, per pmd   [name] [facility] Pager [phone]  Recent Labs  Lab 01/09/20 0111 01/10/20 0405  K 4.2 4.8  BUN 148* 154*  CREATININE 7.67* 7.90*  CALCIUM 8.3* 8.6*  PHOS 6.7* 7.8*  HGB 8.2* 8.9*   Inpatient medications: . (feeding supplement) PROSource Plus  30 mL Oral TID BM  . sodium chloride   Intravenous Once  . acetaminophen  650 mg Oral Q6H  . amLODipine  10 mg Oral Daily  . atorvastatin  40 mg Oral q1800  . carvedilol  25 mg Oral BID WC  . chlorhexidine  60 mL Topical Once  . Chlorhexidine Gluconate Cloth  6 each Topical Q0600  . collagenase   Topical Daily  . dexlansoprazole  60 mg Oral Daily  . docusate sodium  100 mg Oral BID  . DULoxetine  60 mg Oral Daily  . hydrALAZINE  100 mg Oral TID  . insulin aspart  0-5  Units Subcutaneous QHS  . insulin aspart  0-9 Units Subcutaneous TID WC  . insulin aspart  5 Units Subcutaneous TID WC  . insulin glargine  26 Units Subcutaneous QHS  . multivitamin  1 tablet Oral QHS  . povidone-iodine  2 application Topical Once  . sodium bicarbonate  1,300 mg Oral BID   . sodium chloride Stopped (01/09/20 0759)  . sodium chloride 10 mL/hr at 01/09/20 0953  .  ceFAZolin (ANCEF) IV Stopped (01/10/20 0435)  .  ceFAZolin (ANCEF) IV     sodium chloride, alum & mag hydroxide-simeth, calcium carbonate, cyclobenzaprine, guaiFENesin-dextromethorphan, hydrALAZINE, HYDROcodone-acetaminophen, lip balm, loratadine, menthol-cetylpyridinium **OR** phenol, metoCLOPramide **OR** metoCLOPramide (REGLAN) injection, morphine injection, Muscle Rub, ondansetron **OR** ondansetron (ZOFRAN) IV, oxyCODONE, polyvinyl alcohol, senna-docusate, sodium chloride

## 2020-01-10 NOTE — Progress Notes (Signed)
Pre-op orders not released by day shift so unable to secure Gatorade, G2 for ERAS pathway despite coordination with other units, service response and AC. Pharmacy does not carry G2, only dietary has which is already closed.  Will try to reach dietary as soon as they opened early today.

## 2020-01-10 NOTE — Progress Notes (Signed)
Pharmacy Antibiotic Note  [name] is a 54 y.o. female with CKD and s/p left chopart amputation in 2014, presented to the ED  on 12/28/2019 s/p fall with femur fracture.  She underwent left femur intramedullary fixation on 10/28. Patient with continued purulent drainage and concern for deeper infection.  Pharmacy is consulted to dose Ancef for possible osteomyelitis.   Patient tolerated her first session of HD today 01/10/20 and Renal plans for the second session tomorrow.  Afebrile, WBC up to 19.5.  Plan: Switch Ancef to 1gm IV Q24H while initiating HD F/U HD schedule/tolerance, clinical progress Awaiting patient's decision on amputation   Height: 5\' 10"  (177.8 cm) Weight: 94 kg (207 lb 3.7 oz) IBW/kg (Calculated) : 68.5  Temp (24hrs), Avg:98 F (36.7 C), Min:97.7 F (36.5 C), Max:98.2 F (36.8 C)  Recent Labs  Lab 01/06/20 0522 01/07/20 0107 01/08/20 1237 01/09/20 0111 01/10/20 0405  WBC 21.2* 15.6* 18.9* 18.0* 19.5*  CREATININE 6.64* 7.33* 7.55* 7.67* 7.90*    Estimated Creatinine Clearance: 10.3 mL/min (A) (by C-G formula based on SCr of 7.9 mg/dL (H)).    Allergies  Allergen Reactions  . Lidocaine Itching  . Trazodone Swelling  . Latex Rash   Ancef 11/3 >>  11/3 BCx - NGTD 11/3 surgical PCR - MRSA nega, MSSA positive   Jaidyn Usery D. Mina Marble, PharmD, BCPS, [place] 01/10/2020, 1:42 PM

## 2020-01-10 NOTE — Anesthesia Preprocedure Evaluation (Addendum)
Anesthesia Evaluation  Patient identified by MRN, date of birth, ID band Patient awake    Reviewed: Allergy & Precautions, NPO status , Patient's Chart, lab work & pertinent test results  Airway Mallampati: II  TM Distance: >3 FB Neck ROM: Full    Dental  (+) Dental Advisory Given   Pulmonary Current Smoker and Patient abstained from smoking.,    breath sounds clear to auscultation       Cardiovascular hypertension, Pt. on medications and Pt. on home beta blockers  Rhythm:Regular Rate:Normal     Neuro/Psych  Headaches, PSYCHIATRIC DISORDERS Anxiety Depression CVA    GI/Hepatic Neg liver ROS, GERD  Medicated,  Endo/Other  diabetes, Type 1, Insulin Dependent  Renal/GU CRF, ARF and ESRFRenal disease     Musculoskeletal   Abdominal   Peds  Hematology  (+) Blood dyscrasia, anemia ,   Anesthesia Other Findings   Reproductive/Obstetrics                            Lab Results  Component Value Date   WBC 19.5 (H) 01/10/2020   HGB 8.9 (L) 01/10/2020   HCT 27.5 (L) 01/10/2020   MCV 86.5 01/10/2020   PLT 328 01/10/2020   Lab Results  Component Value Date   CREATININE 7.90 (H) 01/10/2020   BUN 154 (H) 01/10/2020   NA 126 (L) 01/10/2020   K 4.8 01/10/2020   CL 92 (L) 01/10/2020   CO2 20 (L) 01/10/2020    Anesthesia Physical  Anesthesia Plan  ASA: III  Anesthesia Plan: General   Post-op Pain Management: GA combined w/ Regional for post-op pain   Induction: Intravenous  PONV Risk Score and Plan: 2 and Dexamethasone, Ondansetron and Treatment may vary due to age or medical condition  Airway Management Planned: LMA  Additional Equipment: None  Intra-op Plan:   Post-operative Plan: Extubation in OR  Informed Consent: I have reviewed the patients History and Physical, chart, labs and discussed the procedure including the risks, benefits and alternatives for the proposed anesthesia  with the patient or authorized representative who has indicated his/her understanding and acceptance.     Dental advisory given  Plan Discussed with: CRNA and Anesthesiologist  Anesthesia Plan Comments:         Anesthesia Quick Evaluation

## 2020-01-10 NOTE — Progress Notes (Signed)
Patient refused dressing change at this time.  Will endorse to day shift RN accordingly.

## 2020-01-10 NOTE — Progress Notes (Signed)
Renal Navigator received notification from Dr. Arlyss Gandy to refer patient for outpatient HD treatment for ESRD. Navigator spoke with patient to confirm address and fixed address in Epic [phone] not [phone]). Referral submitted to [facility] to request a seat at [facility] if they can accommodate her at this time. Navigator will follow closely.   Alphonzo Cruise, [place] Renal Navigator  [phone]

## 2020-01-11 ENCOUNTER — Other Ambulatory Visit (HOSPITAL_COMMUNITY): Payer: Self-pay | Admitting: Orthopedic Surgery

## 2020-01-11 DIAGNOSIS — K5900 Constipation, unspecified: Secondary | ICD-10-CM | POA: Diagnosis not present

## 2020-01-11 DIAGNOSIS — E871 Hypo-osmolality and hyponatremia: Secondary | ICD-10-CM | POA: Diagnosis not present

## 2020-01-11 DIAGNOSIS — M00852 Arthritis due to other bacteria, left hip: Secondary | ICD-10-CM | POA: Diagnosis not present

## 2020-01-11 DIAGNOSIS — T879 Unspecified complications of amputation stump: Secondary | ICD-10-CM | POA: Diagnosis not present

## 2020-01-11 DIAGNOSIS — T148XXA Other injury of unspecified body region, initial encounter: Secondary | ICD-10-CM | POA: Diagnosis not present

## 2020-01-11 DIAGNOSIS — D649 Anemia, unspecified: Secondary | ICD-10-CM | POA: Diagnosis not present

## 2020-01-11 DIAGNOSIS — M8610 Other acute osteomyelitis, unspecified site: Secondary | ICD-10-CM

## 2020-01-11 DIAGNOSIS — I120 Hypertensive chronic kidney disease with stage 5 chronic kidney disease or end stage renal disease: Secondary | ICD-10-CM | POA: Diagnosis not present

## 2020-01-11 DIAGNOSIS — L97524 Non-pressure chronic ulcer of other part of left foot with necrosis of bone: Secondary | ICD-10-CM | POA: Diagnosis not present

## 2020-01-11 DIAGNOSIS — N186 End stage renal disease: Secondary | ICD-10-CM | POA: Diagnosis not present

## 2020-01-11 DIAGNOSIS — E1129 Type 2 diabetes mellitus with other diabetic kidney complication: Secondary | ICD-10-CM | POA: Diagnosis not present

## 2020-01-11 DIAGNOSIS — E8770 Fluid overload, unspecified: Secondary | ICD-10-CM | POA: Diagnosis not present

## 2020-01-11 DIAGNOSIS — G8918 Other acute postprocedural pain: Secondary | ICD-10-CM | POA: Diagnosis not present

## 2020-01-11 DIAGNOSIS — N179 Acute kidney failure, unspecified: Secondary | ICD-10-CM | POA: Diagnosis not present

## 2020-01-11 DIAGNOSIS — N185 Chronic kidney disease, stage 5: Secondary | ICD-10-CM | POA: Diagnosis not present

## 2020-01-11 LAB — CBC
HCT: 27.6 % — ABNORMAL LOW (ref 36.0–46.0)
Hemoglobin: 8.7 g/dL — ABNORMAL LOW (ref 12.0–15.0)
MCH: 27.5 pg (ref 26.0–34.0)
MCHC: 31.5 g/dL (ref 30.0–36.0)
MCV: 87.3 fL (ref 80.0–100.0)
Platelets: 344 10*3/uL (ref 150–400)
RBC: 3.16 MIL/uL — ABNORMAL LOW (ref 3.87–5.11)
RDW: 15 % (ref 11.5–15.5)
WBC: 17.8 10*3/uL — ABNORMAL HIGH (ref 4.0–10.5)
nRBC: 0 % (ref 0.0–0.2)

## 2020-01-11 LAB — RENAL FUNCTION PANEL
Albumin: 1.8 g/dL — ABNORMAL LOW (ref 3.5–5.0)
Anion gap: 12 (ref 5–15)
BUN: 94 mg/dL — ABNORMAL HIGH (ref 6–20)
CO2: 24 mmol/L (ref 22–32)
Calcium: 8.4 mg/dL — ABNORMAL LOW (ref 8.9–10.3)
Chloride: 97 mmol/L — ABNORMAL LOW (ref 98–111)
Creatinine, Ser: 5.43 mg/dL — ABNORMAL HIGH (ref 0.44–1.00)
GFR, Estimated: 9 mL/min — ABNORMAL LOW (ref >60)
Glucose, Bld: 269 mg/dL — ABNORMAL HIGH (ref 70–99)
Phosphorus: 5.9 mg/dL — ABNORMAL HIGH (ref 2.5–4.6)
Potassium: 3.9 mmol/L (ref 3.5–5.1)
Sodium: 133 mmol/L — ABNORMAL LOW (ref 135–145)

## 2020-01-11 LAB — GLUCOSE, CAPILLARY
Glucose-Capillary: 202 mg/dL — ABNORMAL HIGH (ref 70–99)
Glucose-Capillary: 151 mg/dL — ABNORMAL HIGH (ref 70–99)
Glucose-Capillary: 97 mg/dL (ref 70–99)
Glucose-Capillary: 230 mg/dL — ABNORMAL HIGH (ref 70–99)

## 2020-01-11 LAB — MAGNESIUM: Magnesium: 1.8 mg/dL (ref 1.7–2.4)

## 2020-01-11 MED ORDER — HEPARIN SODIUM (PORCINE) 1000 UNIT/ML IJ SOLN
INTRAMUSCULAR | Status: AC
  Filled 2020-01-11: qty 4

## 2020-01-11 MED ORDER — INSULIN GLARGINE 100 UNIT/ML ~~LOC~~ SOLN
34.0000 [IU] | Freq: Every day | SUBCUTANEOUS | Status: DC
  Filled 2020-01-11 (×2): qty 0.34

## 2020-01-11 MED ORDER — INSULIN ASPART 100 UNIT/ML ~~LOC~~ SOLN
8.0000 [IU] | Freq: Three times a day (TID) | SUBCUTANEOUS | Status: DC
  Administered 2020-01-11 – 2020-01-23 (×26): 8 [IU] via SUBCUTANEOUS

## 2020-01-11 NOTE — Progress Notes (Addendum)
PROGRESS NOTE    [name]   WUJ:[id number]  DOB: [birth year]  DOA: 12/28/2019 PCP: Julian Hy, PA-C   Brief Narrative:  [name]  54 year old female with DM, HTN, CVA, CKD 5, status post left foot Chopart amputation 2014 managed by podiatry, left SFA revascularization in 2020, who came into the hospital after a ground-level fall resulting in left hip fracture.  Noted to have swollen legs in ED and a worsening Cr. Nephrology and ortho consulted in ED.  Noted to have leukocytosis which was worsening (35K). Had drainage from the left foot amputation site and began on Ancef ~ 11/3 for infection. ID consulted on 11/4.   MRI on 11/7 revealed an abscess and osteomyelitis 11/8 Tunneled dialysis cath  Subjective: Having pain in left foot that does become controlled with medications. No other complaints.     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 - cont Ancef - plan for left BKA tomorrow by ortho - has been hesitant to have work up and surgery but as of now, is in agreement     Diabetes mellitus type 2 - sugars uncontrolled on current dose of Lantus and Novolog (which she also takes at home) - have increased doses (will not increase Lantus much as she will be NPO tomorrow) - A1c on 10/28 was 6.2  GERD - cont PPI-   Essential HTN - cont Carvedilol, Hydralazine and Amlodipine - BP slightly elevated- partly due to acute pain     Acute on chronic anemia - likely due to hip fracture and acute infection - Hb dropped to 6.3 on 10/27- she received 2 U PRBC - 11/2- 1 U PRBC for Hb of 7.2 11/6  1 U PRBC for Hb of 7.3 - given Feraheme on 11/6   Time spent in minutes: 40 DVT prophylaxis: SCDs Start: 12/29/19 1621  Code Status: Full code Family Communication:  Disposition Plan:  Status is: Inpatient  Remains inpatient appropriate because:IV treatments appropriate due to intensity of illness or inability to take PO   Dispo: The patient is from: Home              Anticipated d/c is to: SNF              Anticipated d/c date is: > 3 days              Patient currently is not medically stable to d/c.  Consultants:   ID  Vascular  Nephrology   Ortho     Antimicrobials:  Anti-infectives (From admission, onward)   Start     Dose/Rate Route Frequency Ordered Stop   01/10/20 2000  ceFAZolin (ANCEF) IVPB 1 g/50 mL premix        1 g 100 mL/hr over 30 Minutes Intravenous Every 24 hours 01/10/20 1343     01/10/20 0600  ceFAZolin (ANCEF) IVPB 2g/100 mL premix        2 g 200 mL/hr over 30 Minutes Intravenous On call to O.R. 01/09/20 1952 01/11/20 0559   01/06/20 0400  ceFAZolin (ANCEF) IVPB 2g/100 mL premix  Status:  Discontinued  2 g 200 mL/hr over 30 Minutes Intravenous Every 12 hours 01/05/20 1709 01/10/20 1343   01/05/20 2200  ceFAZolin (ANCEF) IVPB 2g/100 mL premix  Status:  Discontinued        2 g 200 mL/hr over 30 Minutes Intravenous Every 8 hours 01/05/20 1707 01/05/20 1709   01/04/20 1700  ceFAZolin (ANCEF) IVPB 1 g/50 mL premix  Status:  Discontinued        1 g 100 mL/hr over 30 Minutes Intravenous Every 12 hours 01/04/20 1611 01/05/20 1707   12/29/19 1930  ceFAZolin (ANCEF) IVPB 2g/100 mL premix        2 g 200 mL/hr over 30 Minutes Intravenous Every 6 hours 12/29/19 1620 12/30/19 0201   12/29/19 1300  ceFAZolin (ANCEF) IVPB 2g/100 mL premix        2 g 200 mL/hr over 30 Minutes Intravenous On call to O.R. 12/29/19 0113 12/29/19 1325       Objective: Vitals:   01/10/20 1804 01/10/20 2052 01/11/20 0449 01/11/20 1259  BP: (!) 151/62 (!) 136/59 (!)  143/59 (!) 150/56  Pulse: 67 60 60 66  Resp:  17 20 14   Temp:  98.1 F (36.7 C) 97.8 F (36.6 C) 98.2 F (36.8 C)  TempSrc:  Oral  Oral  SpO2: 97% 96% 98% 97%  Weight:      Height:        Intake/Output Summary (Last 24 hours) at 01/11/2020 1418 Last data filed at 01/11/2020 1138 Gross per 24 hour  Intake 50 ml  Output 900 ml  Net -850 ml   Filed Weights   01/06/20 0430 01/10/20 0657 01/10/20 1007  Weight: 95.7 kg 94.7 kg 94 kg    Examination: General exam: Appears comfortable  HEENT: PERRLA, oral mucosa moist, no sclera icterus or thrush Respiratory system: Clear to auscultation. Respiratory effort normal. Cardiovascular system: S1 & S2 heard, RRR.   Gastrointestinal system: Abdomen soft, non-tender, nondistended. Normal bowel sounds. Central nervous system: Alert and oriented. No focal neurological deficits. Extremities: No cyanosis, clubbing or edema- left foot dressing not opened Skin: No rashes or ulcers Psychiatry:  Appears depressed    Data Reviewed: I have personally reviewed following labs and imaging studies  CBC: Recent Labs  Lab 01/05/20 0523 01/05/20 0523 01/06/20 0522 01/06/20 0522 01/07/20 0107 01/08/20 1237 01/09/20 0111 01/10/20 0405 01/11/20 0139  WBC 34.7*   < > 21.2*   < > 15.6* 18.9* 18.0* 19.5* 17.8*  NEUTROABS 30.5*  --  17.9*  --  12.5*  --   --   --   --   HGB 7.6*   < > 7.1*   < > 7.3* 8.6* 8.2* 8.9* 8.7*  HCT 23.8*   < > 22.6*   < > 22.5* 25.9* 25.8* 27.5* 27.6*  MCV 88.5   < > 89.0   < > 86.9 85.8 85.7 86.5 87.3  PLT 241   < > 210   < > 231 266 261 328 344   < > = values in this interval not displayed.   Basic Metabolic Panel: Recent Labs  Lab 01/05/20 0523 01/05/20 0523 01/06/20 0522 01/06/20 0522 01/07/20 0107 01/08/20 1237 01/09/20 0111 01/10/20 0405 01/11/20 0139  NA 125*   < > 126*   < > 127* 126* 127* 126* 133*  K 4.7   < > 3.9   < > 4.2 4.3 4.2 4.8 3.9  CL 96*   < > 93*   < > 92* 92* 92* 92* 97*  CO2 16*   <  > 20*   < > 22 19* 21* 20* 24  GLUCOSE 352*   < > 248*   < > 193* 223* 216* 320* 269*  BUN 119*   < > 123*   < > 134* 143* 148* 154* 94*  CREATININE 6.76*   < > 6.64*   < > 7.33* 7.55* 7.67* 7.90* 5.43*  CALCIUM 8.0*   < > 7.4*   < > 7.7* 8.2* 8.3* 8.6* 8.4*  MG 1.5*  --  1.5*  --  1.6*  --   --   --  1.8  PHOS 6.0*   < > 5.5*   < > 5.8* 6.4* 6.7* 7.8* 5.9*   < > = values in this interval not displayed.   GFR: Estimated Creatinine Clearance: 15.1 mL/min (A) (by C-G formula based on SCr of 5.43 mg/dL (H)). Liver Function Tests: Recent Labs  Lab 01/05/20 0523 01/05/20 0523 01/06/20 0522 01/06/20 0522 01/07/20 0107 01/08/20 1237 01/09/20 0111 01/10/20 0405 01/11/20 0139  AST 22  --  31  --  21  --   --   --   --   ALT 12  --  12  --  6  --   --   --   --   ALKPHOS 216*  --  223*  --  223*  --   --   --   --   BILITOT 1.0  --  0.9  --  0.7  --   --   --   --   PROT 6.2*  --  5.8*  --  5.8*  --   --   --   --   ALBUMIN 2.1*   < > 1.9*   < > 1.7* 1.7* 1.7* 1.7* 1.8*   < > = values in this interval not displayed.   No results for input(s): LIPASE, AMYLASE in the last 168 hours. No results for input(s): AMMONIA in the last 168 hours. Coagulation Profile: No results for input(s): INR, PROTIME in the last 168 hours. Cardiac Enzymes: No results for input(s): CKTOTAL, CKMB, CKMBINDEX, TROPONINI in the last 168 hours. BNP (last 3 results) No results for input(s): PROBNP in the last 8760 hours. HbA1C: No results for input(s): HGBA1C in the last 72 hours. CBG: Recent Labs  Lab 01/10/20 1115 01/10/20 1700 01/10/20 2049 01/11/20 0745 01/11/20 1154  GLUCAP 197* 254* 300* 202* 151*   Lipid Profile: No results for input(s): CHOL, HDL, LDLCALC, TRIG, CHOLHDL, LDLDIRECT in the last 72 hours. Thyroid Function Tests: No results for input(s): TSH, T4TOTAL, FREET4, T3FREE, THYROIDAB in the last 72 hours. Anemia Panel: No results for input(s): VITAMINB12, FOLATE, FERRITIN, TIBC, IRON,  RETICCTPCT in the last 72 hours. Urine analysis:    Component Value Date/Time   COLORURINE YELLOW 03/29/2014 [place] 03/29/2014 1259   LABSPEC 1.020 03/29/2014 1259   PHURINE 7.5 03/29/2014 1259   GLUCOSEU NEGATIVE 03/29/2014 1259   HGBUR TRACE-INTACT (A) 03/29/2014 1259   BILIRUBINUR neg 02/16/2017 1501   KETONESUR NEGATIVE 03/29/2014 1259   PROTEINUR Positive (A) 10/13/2017 1632   UROBILINOGEN 0.2 02/16/2017 1501   UROBILINOGEN 0.2 03/29/2014 1259   NITRITE neg 02/16/2017 1501   NITRITE NEGATIVE 03/29/2014 1259   LEUKOCYTESUR Negative 02/16/2017 1501   Sepsis Labs: @LABRCNTIP (procalcitonin:4,lacticidven:4) ) Recent Results (from the past 240 hour(s))  Culture, blood (routine x 2)     Status: None   Collection Time: 01/04/20  5:43 PM  Specimen: BLOOD LEFT HAND  Result Value Ref Range Status   Specimen Description   Final    BLOOD LEFT HAND Performed at [facility] [address], [place], [place] [address]    Special Requests   Final    BOTTLES DRAWN AEROBIC AND ANAEROBIC Blood Culture adequate volume Performed at [facility] [address] [place], [place] [address]    Culture   Final    NO GROWTH 5 DAYS Performed at [facility], [place] [address], [place], [place] [address]    Report Status 01/09/2020 FINAL  Final  Culture, blood (routine x 2)     Status: None   Collection Time: 01/04/20  5:51 PM   Specimen: BLOOD  Result Value Ref Range Status   Specimen Description   Final    BLOOD RIGHT ANTECUBITAL Performed at [facility] [address] [place], [place] [address]    Special Requests   Final    BOTTLES DRAWN AEROBIC ONLY Blood Culture results may not be optimal due to an inadequate volume of blood received in culture bottles Performed at [facility] [address] [place], [place] [address]    Culture   Final    NO GROWTH 5 DAYS Performed at [facility], [place] [address], [place], [place] [address]    Report Status 01/09/2020 FINAL  Final  Surgical pcr screen     Status: None   Collection Time: 01/09/20  7:57 PM   Specimen: Nasal Mucosa; Nasal Swab  Result Value Ref Range Status   MRSA, PCR NEGATIVE NEGATIVE Final   Staphylococcus aureus NEGATIVE NEGATIVE Final    Comment: (NOTE) The Xpert SA Assay (FDA approved for NASAL specimens in patients 34 years of age and older), is one component of a comprehensive surveillance program. It is not intended to diagnose infection nor to guide or monitor treatment. Performed at [facility], [place] [address] [place],  [address]          Radiology Studies: No results found.    Scheduled Meds: . sodium chloride   Intravenous Once  . acetaminophen  650 mg Oral Q6H  . amLODipine  10 mg Oral Daily  . atorvastatin  40 mg Oral q1800  . carvedilol  25 mg Oral BID WC  . chlorhexidine  60 mL Topical Once  . Chlorhexidine Gluconate Cloth  6 each Topical Q0600  . collagenase   Topical Daily  . darbepoetin (ARANESP) injection - DIALYSIS  60 mcg Intravenous Q Tue-HD  . dexlansoprazole  60 mg Oral Daily  . docusate sodium  100 mg Oral BID  . DULoxetine  60 mg Oral Daily  . feeding supplement (NEPRO CARB STEADY)  237 mL Oral QPC supper  . hydrALAZINE  100 mg Oral TID  . insulin aspart  0-5 Units Subcutaneous QHS  . insulin aspart  0-9 Units Subcutaneous TID WC  . insulin aspart  8 Units Subcutaneous TID WC  . insulin glargine  34 Units Subcutaneous QHS  . multivitamin  1 tablet Oral QHS  . povidone-iodine  2 application Topical Once  . sevelamer carbonate  800 mg Oral TID WC   Continuous Infusions: . sodium chloride Stopped (01/09/20 0759)  . sodium chloride 10 mL/hr at 01/10/20 1510  .  ceFAZolin (ANCEF) IV 1 g (01/10/20 2024)     LOS: 14 days      Debbe Odea, MD [facility] Pager: [email or web address]  01/11/2020, 2:18 PM

## 2020-01-11 NOTE — Progress Notes (Signed)
Occupational Therapy Treatment Patient Details Name: [name] MRN: [id number] DOB: 1965/05/08 Today's Date: 01/11/2020    History of present illness 54 year old female with DM, HTN, CKD 5, status post left foot Chopart amputation 2014 managed by podiatry, left SFA vascularization in 2020, prior CVA who came into the hospital after a ground-level fall resulting in left hip fracture,   S/P Intramedullary fixation, Left femur on 10/28.   OT comments  Pt progressing to EOB activity due to pain/nerve pain in LLE. Pt medicated prior to arrival. Pt performing bed mobility with modA +2 for stability of trunk and movement of LLE to EOB. Pt sitting EOB ~10 mins with attempts to scoot toward HOB. Pt unable to attempt sit to stand today. OT to re-evaluate for POC update after amputation scheduled 01/12/20. OT following acutely. Recommend SNF.    Follow Up Recommendations  SNF    Equipment Recommendations  Wheelchair (measurements OT);3 in 1 bedside commode;Tub/shower bench    Recommendations for Other Services      Precautions / Restrictions Precautions Precautions: Fall Precaution Comments: has been limited WB on L foot plantar due to wound Restrictions Weight Bearing Restrictions: Yes LLE Weight Bearing: Weight bearing as tolerated Other Position/Activity Restrictions: WBAT on Left hip but has been essentially NWB on L plantar PTA       Mobility Bed Mobility Overal bed mobility: Needs Assistance Bed Mobility: Supine to Sit;Sit to Supine     Supine to sit: Mod assist;+2 for physical assistance Sit to supine: Mod assist;+2 for physical assistance   General bed mobility comments: Assist for LLE to move across bed to EOB slowly  Transfers Overall transfer level: Needs assistance Equipment used: None Transfers: Lateral/Scoot Transfers          Lateral/Scoot Transfers:  (attempted, patient declined after minimal movement)      Balance Overall balance assessment: Needs  assistance;History of Falls Sitting-balance support: Feet supported;Bilateral upper extremity supported Sitting balance-Leahy Scale: Fair                                     ADL either performed or assessed with clinical judgement   ADL Overall ADL's : Needs assistance/impaired     Grooming: Set up;Sitting;Wash/dry face                               Functional mobility during ADLs: Moderate assistance;+2 for physical assistance;+2 for safety/equipment General ADL Comments: patient requiring increased assistance with self care due to pain, decreased activity tolerance, balance, safety awareness     Vision   Vision Assessment?: No apparent visual deficits   Perception     Praxis      Cognition Arousal/Alertness: Awake/alert Behavior During Therapy: Flat affect Overall Cognitive Status: Within Functional Limits for tasks assessed                                 General Comments: following commands; stating her need for medication prior to movement        Exercises     Shoulder Instructions       General Comments Pt requiring pain medication and then nerve medication prior to movement; pt education for the need in future sessions to get OOB.    Pertinent Vitals/ Pain       Pain  Assessment: 0-10 Pain Score: 10-Worst pain ever Pain Location: Left Hip Pain Descriptors / Indicators: Guarding;Grimacing;Sore;Constant Pain Intervention(s): Monitored during session;Patient requesting pain meds-RN notified;RN gave pain meds during session  Home Living                                          Prior Functioning/Environment              Frequency  Min 2X/week        Progress Toward Goals  OT Goals(current goals can now be found in the care plan section)  Progress towards OT goals: Progressing toward goals  Acute Rehab OT Goals Patient Stated Goal: to reduce pain OT Goal Formulation: With  patient Time For Goal Achievement: 01/13/20 Potential to Achieve Goals: Good ADL Goals Pt Will Perform Lower Body Dressing: with supervision;with adaptive equipment;sit to/from stand;sitting/lateral leans Pt Will Transfer to Toilet: with supervision;squat pivot transfer;bedside commode Pt Will Perform Toileting - Clothing Manipulation and hygiene: with supervision;sitting/lateral leans;sit to/from stand Additional ADL Goal #1: Patient will be supervision level for bed mobility in preparation for self care tasks.  Plan Discharge plan needs to be updated    Co-evaluation    PT/OT/SLP Co-Evaluation/Treatment: Yes Reason for Co-Treatment: To address functional/ADL transfers;For patient/therapist safety   OT goals addressed during session: ADL's and self-care;Strengthening/ROM      AM-PAC OT "6 Clicks" Daily Activity     Outcome Measure   Help from another person eating meals?: None Help from another person taking care of personal grooming?: A Little Help from another person toileting, which includes using toliet, bedpan, or urinal?: Total Help from another person bathing (including washing, rinsing, drying)?: A Lot Help from another person to put on and taking off regular upper body clothing?: A Little Help from another person to put on and taking off regular lower body clothing?: A Lot 6 Click Score: 15    End of Session Equipment Utilized During Treatment: Gait belt;Rolling walker  OT Visit Diagnosis: Pain;Muscle weakness (generalized) (M62.81);Unsteadiness on feet (R26.81) Pain - Right/Left: Left Pain - part of body: Hip;Ankle and joints of foot   Activity Tolerance Patient limited by pain   Patient Left in bed;with call bell/phone within reach;with nursing/sitter in room   Nurse Communication Mobility status        Time: 7408-1448 OT Time Calculation (min): 24 min  Charges: OT General Charges $OT Visit: 1 Visit OT Treatments $Therapeutic Activity: 8-22  mins  Jefferey Pica, OTR/L Acute Rehabilitation Services Pager: [phone] Office: [phone]    Jefferey Pica 01/11/2020, 5:29 PM

## 2020-01-11 NOTE — Progress Notes (Signed)
Physical Therapy Treatment Patient Details Name: [name] MRN: [id number] DOB: 1965-10-31 Today's Date: 01/11/2020    History of Present Illness 54 year old female with DM, HTN, CKD 5, status post left foot Chopart amputation 2014 managed by podiatry, left SFA vascularization in 2020, prior CVA who came into the hospital after a ground-level fall resulting in left hip fracture,   S/P Intramedullary fixation, Left femur on 10/28.    PT Comments    Session limited by pain, RN pre-medicated patient prior to session. Patient requires modA+2 for bed mobility with assist for guidance of L LEs off bed and trunk elevation. Patient declined sit to stand transfer but initially agreeable to lateral scoot transfer. Patient attempted and declined, requesting to return to bed. Tested patient B LE strength with R LE 3-/5 and L LE 2-/5. Patient continues to be limited by increased pain, decreased activity tolerance, generalized weakness, impaired balance, and impaired functional mobility. Continue to recommend SNF for ongoing Physical Therapy.     Patient to undergo L BKA on 01/12/20. PT to re-evaluate following procedure.    Follow Up Recommendations  SNF;Supervision/Assistance - 24 hour     Equipment Recommendations  Wheelchair (measurements PT);Wheelchair cushion (measurements PT);Rolling Jana Swartzlander with 5" wheels    Recommendations for Other Services       Precautions / Restrictions Precautions Precautions: Fall Precaution Comments: has been limited WB on L foot plantar due to wound Restrictions Weight Bearing Restrictions: Yes LLE Weight Bearing: Weight bearing as tolerated Other Position/Activity Restrictions: WBAT on Left hip but has been essentially NWB on L plantar PTA    Mobility  Bed Mobility Overal bed mobility: Needs Assistance Bed Mobility: Supine to Sit;Sit to Supine     Supine to sit: Mod assist;+2 for physical assistance Sit to supine: Mod assist;+2 for physical  assistance   General bed mobility comments: assist with L LEs off bed and boost trunk up to sitting  Transfers Overall transfer level: Needs assistance Equipment used: None Transfers: Lateral/Scoot Transfers          Lateral/Scoot Transfers:  (attempted, patient declined after minimal movement)    Ambulation/Gait                 Stairs             Wheelchair Mobility    Modified Rankin (Stroke Patients Only)       Balance Overall balance assessment: Needs assistance;History of Falls Sitting-balance support: Feet supported;Bilateral upper extremity supported Sitting balance-Leahy Scale: Fair                                      Cognition Arousal/Alertness: Awake/alert Behavior During Therapy: Flat affect Overall Cognitive Status: Within Functional Limits for tasks assessed                                        Exercises General Exercises - Lower Extremity Long Arc Quad: AROM;Both Hip Flexion/Marching: AROM;Both    General Comments        Pertinent Vitals/Pain Pain Assessment: 0-10 Pain Score: 10-Worst pain ever Pain Location: Left Hip Pain Descriptors / Indicators: Guarding;Grimacing;Sore;Constant Pain Intervention(s): Limited activity within patient's tolerance;Monitored during session;Repositioned    Home Living  Prior Function            PT Goals (current goals can now be found in the care plan section) Acute Rehab PT Goals Patient Stated Goal: to reduce pain PT Goal Formulation: With patient Time For Goal Achievement: 01/25/20 Potential to Achieve Goals: Fair Progress towards PT goals: Not progressing toward goals - comment (limited due to pain)    Frequency    Min 3X/week      PT Plan Frequency needs to be updated    Co-evaluation PT/OT/SLP Co-Evaluation/Treatment: Yes Reason for Co-Treatment: For patient/therapist safety;To address functional/ADL  transfers PT goals addressed during session: Mobility/safety with mobility;Strengthening/ROM        AM-PAC PT "6 Clicks" Mobility   Outcome Measure  Help needed turning from your back to your side while in a flat bed without using bedrails?: A Lot Help needed moving from lying on your back to sitting on the side of a flat bed without using bedrails?: A Lot Help needed moving to and from a bed to a chair (including a wheelchair)?: A Lot Help needed standing up from a chair using your arms (e.g., wheelchair or bedside chair)?: A Lot Help needed to walk in hospital room?: Total Help needed climbing 3-5 steps with a railing? : Total 6 Click Score: 10    End of Session   Activity Tolerance: Patient limited by pain Patient left: in bed;with call bell/phone within reach Nurse Communication: Mobility status PT Visit Diagnosis: Difficulty in walking, not elsewhere classified (R26.2);Pain Pain - Right/Left: Left Pain - part of body: Hip;Knee     Time: 7741-2878 PT Time Calculation (min) (ACUTE ONLY): 24 min  Charges:  $Therapeutic Activity: 8-22 mins                     Perrin Maltese, PT, DPT Acute Rehabilitation Services Pager [phone] Office [phone]    [name] 01/11/2020, 1:08 PM

## 2020-01-11 NOTE — Progress Notes (Signed)
[name] Progress Note  Subjective: HD #1 yesterday, tol fine.  For HD #2 today and amputation tomorrow.  Vitals:   01/10/20 1122 01/10/20 1804 01/10/20 2052 01/11/20 0449  BP: (!) 160/58 (!) 151/62 (!) 136/59 (!) 143/59  Pulse: 73 67 60 60  Resp: _0 Temp: 98.2 F (36.8 C)  98.1 F (36.7 C) 97.8 F (36.6 C)  TempSrc: Oral  Oral   SpO2: 95% 97% 96% 98%  Weight:      Height:        Exam: Gen alert, no distress MMM No jvd or bruits Chest clear bilat to bases, no rales RRR no MRG Abd soft ntnd no mass or ascites +bs Ext trace ankle edema, L foot dressing intact Neuro is alert, Ox 3 , nf RIJ TDC c/d/i, L AC fossa incision c/d/i, L radial pulse strong and hand is warm    Home meds:  - norvasc 5/ coreg 6.25/ asa / lipitor / hydralazine 50 tid  - insulin lantus 50u/ aspart 10u tid  - oxy IR prn/ cymbalta qd  - dexilant/ flexeril prn/ protonix 40     CXR - no acute disease     Last creat's outpatient >>      Aug 2021 - creat 4.9      Dec 23, 2019 - creat 5.2        Assessment/ Plan: 1. AoCKD 5 now ESRD- b/l creat 4.9- 5.2, eGFR 9- 11 from 8/21- 10/21. Pt f/b [name]. Creat 6 on admission, now mid 7's, BUN ^^. IVF's given w/o improvement. S/p TDC and perm access 11/8, 1st HD 11/9, plan for #2 tomorrow.  CLIP to outpt HD. 2. Met acidosis - better, d/c po bicarb 3. Anemia ckd - Hb 7.6, was 6.3 on admit > got 2u prbc's, Hb in 8-9s now. Not symptomatic. Tsat low 10%, got 51m dose Feraheme on 11/06. Started ESA.  4. 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 Seen by ID, ancef dose adjusted -- per pt plans for further amputation after MRI findings reviewed - scheduled for 11/11. 5. HTN/ vol  - BP's are stable / normal on her 3 home meds 6. DM - on insulin, per pmd 7. L hip fracture - admission diagnosis. sp ORIF on 10/28 per ortho 8. BMM - started binder, follow phos.  Check PTH - pending.   [name] [facility] Pager [phone]  Recent Labs  Lab 01/10/20 0405 01/11/20 0139  K 4.8 3.9  BUN 154* 94*  CREATININE 7.90* 5.43*  CALCIUM 8.6* 8.4*  PHOS 7.8* 5.9*  HGB 8.9* 8.7*   Inpatient medications: . sodium chloride   Intravenous Once  . acetaminophen  650 mg Oral Q6H  . amLODipine  10 mg Oral Daily  . atorvastatin  40 mg Oral q1800  . carvedilol  25 mg Oral BID WC  . chlorhexidine  60 mL Topical Once  . Chlorhexidine Gluconate Cloth  6 each Topical Q0600  . collagenase   Topical Daily  . darbepoetin (ARANESP) injection - DIALYSIS  60 mcg Intravenous Q Tue-HD  . dexlansoprazole  60 mg Oral Daily  . docusate sodium  100 mg Oral BID  . DULoxetine  60 mg Oral Daily  . feeding supplement (NEPRO CARB STEADY)  237 mL Oral QPC supper  . hydrALAZINE  100 mg Oral TID  . insulin aspart  0-5 Units Subcutaneous QHS  .  insulin aspart  0-9 Units Subcutaneous TID WC  . insulin aspart  5 Units Subcutaneous TID WC  . insulin glargine  30 Units Subcutaneous QHS  . multivitamin  1 tablet Oral QHS  . povidone-iodine  2 application Topical Once  . sevelamer carbonate  800 mg Oral TID WC  . sodium bicarbonate  1,300 mg Oral BID   . sodium chloride Stopped (01/09/20 0759)  . sodium chloride 10 mL/hr at 01/10/20 1510  .  ceFAZolin (ANCEF) IV 1 g (01/10/20 2024)   sodium chloride, alum & mag hydroxide-simeth, calcium carbonate, cyclobenzaprine, guaiFENesin-dextromethorphan, hydrALAZINE, HYDROcodone-acetaminophen, lip balm, loratadine, menthol-cetylpyridinium **OR** phenol, metoCLOPramide **OR** metoCLOPramide (REGLAN) injection, morphine injection, Muscle Rub, ondansetron **OR** ondansetron (ZOFRAN) IV, oxyCODONE, polyvinyl alcohol, senna-docusate, sodium chloride

## 2020-01-11 NOTE — Progress Notes (Signed)
Subjective: 2 Days Post-Op Procedure(s) (LRB): LEFT UPPER ARM ARTERIOVENOUS (AV) FISTULA CREATION (Left) INSERTION OF DIALYSIS CATHETER (Right)  Resting comfortably in bed this morning.  Objective:   VITALS:  Temp:  [97.8 F (36.6 C)-98.2 F (36.8 C)] 97.8 F (36.6 C) (11/10 0449) Pulse Rate:  [60-73] 60 (11/10 0449) Resp:  [13-20] 20 (11/10 0449) BP: (136-165)/(58-67) 143/59 (11/10 0449) SpO2:  [95 %-98 %] 98 % (11/10 0449) Weight:  [94 kg] 94 kg (11/09 1007)  General: WDWN patient in NAD. Psych:  Appropriate mood and affect. Neuro:  A&O x 3, Moving all extremities, sensation intact to light touch HEENT:  EOMs intact Chest:  Even non-labored respirations Skin:  Dressing C/D/I, no rashes or lesions Extremities: warm/dry, no visible edema, erythema or echymosis.  No lymphadenopathy. Pulses: Popliteus 2+ MSK:  ROM: TKE, MMT: able to perform quad set    LABS Recent Labs    01/08/20 1237 01/08/20 1237 01/09/20 0111 01/09/20 0111 01/10/20 0405 01/11/20 0139  HGB 8.6*  --  8.2*  --  8.9* 8.7*  WBC 18.9*   < > 18.0*   < > 19.5* 17.8*  PLT 266   < > 261   < > 328 344   < > = values in this interval not displayed.   Recent Labs    01/10/20 0405 01/11/20 0139  NA 126* 133*  K 4.8 3.9  CL 92* 97*  CO2 20* 24  BUN 154* 94*  CREATININE 7.90* 5.43*  GLUCOSE 320* 269*   No results for input(s): LABPT, INR in the last 72 hours.   Assessment/Plan: 2 Days Post-Op Procedure(s) (LRB): LEFT UPPER ARM ARTERIOVENOUS (AV) FISTULA CREATION (Left) INSERTION OF DIALYSIS CATHETER (Right)  Plan to OR tomorrow morning with Dr. Doran Durand for L BKA.  Surgery orders placed Hold blood thinners NPO after midnight.  Mechele Claude PA-C [facility] Office:  [phone]

## 2020-01-12 ENCOUNTER — Encounter (HOSPITAL_COMMUNITY): Payer: Self-pay | Admitting: Internal Medicine

## 2020-01-12 ENCOUNTER — Inpatient Hospital Stay (HOSPITAL_COMMUNITY)
Admission: EM | Disposition: A | Discharge status: Rehabilitation Facility | Payer: Self-pay | Source: Home / Self Care | Attending: Internal Medicine

## 2020-01-12 ENCOUNTER — Inpatient Hospital Stay (HOSPITAL_COMMUNITY): Payer: Medicaid Other | Admitting: Anesthesiology

## 2020-01-12 DIAGNOSIS — E1140 Type 2 diabetes mellitus with diabetic neuropathy, unspecified: Secondary | ICD-10-CM | POA: Diagnosis not present

## 2020-01-12 DIAGNOSIS — D62 Acute posthemorrhagic anemia: Secondary | ICD-10-CM | POA: Diagnosis not present

## 2020-01-12 DIAGNOSIS — L97529 Non-pressure chronic ulcer of other part of left foot with unspecified severity: Secondary | ICD-10-CM | POA: Diagnosis not present

## 2020-01-12 DIAGNOSIS — M8610 Other acute osteomyelitis, unspecified site: Secondary | ICD-10-CM | POA: Diagnosis not present

## 2020-01-12 DIAGNOSIS — K5900 Constipation, unspecified: Secondary | ICD-10-CM | POA: Diagnosis not present

## 2020-01-12 DIAGNOSIS — M00852 Arthritis due to other bacteria, left hip: Secondary | ICD-10-CM | POA: Diagnosis not present

## 2020-01-12 DIAGNOSIS — L97329 Non-pressure chronic ulcer of left ankle with unspecified severity: Secondary | ICD-10-CM | POA: Diagnosis not present

## 2020-01-12 DIAGNOSIS — M86172 Other acute osteomyelitis, left ankle and foot: Secondary | ICD-10-CM | POA: Diagnosis not present

## 2020-01-12 DIAGNOSIS — L97524 Non-pressure chronic ulcer of other part of left foot with necrosis of bone: Secondary | ICD-10-CM | POA: Diagnosis not present

## 2020-01-12 DIAGNOSIS — S72142A Displaced intertrochanteric fracture of left femur, initial encounter for closed fracture: Secondary | ICD-10-CM | POA: Diagnosis not present

## 2020-01-12 DIAGNOSIS — Z794 Long term (current) use of insulin: Secondary | ICD-10-CM | POA: Diagnosis not present

## 2020-01-12 DIAGNOSIS — E872 Acidosis: Secondary | ICD-10-CM | POA: Diagnosis not present

## 2020-01-12 DIAGNOSIS — M86672 Other chronic osteomyelitis, left ankle and foot: Secondary | ICD-10-CM | POA: Diagnosis not present

## 2020-01-12 DIAGNOSIS — Z20822 Contact with and (suspected) exposure to covid-19: Secondary | ICD-10-CM | POA: Diagnosis not present

## 2020-01-12 DIAGNOSIS — L03116 Cellulitis of left lower limb: Secondary | ICD-10-CM | POA: Diagnosis not present

## 2020-01-12 DIAGNOSIS — E871 Hypo-osmolality and hyponatremia: Secondary | ICD-10-CM | POA: Diagnosis not present

## 2020-01-12 DIAGNOSIS — E1169 Type 2 diabetes mellitus with other specified complication: Secondary | ICD-10-CM | POA: Diagnosis not present

## 2020-01-12 DIAGNOSIS — N179 Acute kidney failure, unspecified: Secondary | ICD-10-CM | POA: Diagnosis not present

## 2020-01-12 DIAGNOSIS — N186 End stage renal disease: Secondary | ICD-10-CM | POA: Diagnosis not present

## 2020-01-12 DIAGNOSIS — E8770 Fluid overload, unspecified: Secondary | ICD-10-CM | POA: Diagnosis not present

## 2020-01-12 DIAGNOSIS — N185 Chronic kidney disease, stage 5: Secondary | ICD-10-CM | POA: Diagnosis not present

## 2020-01-12 DIAGNOSIS — I120 Hypertensive chronic kidney disease with stage 5 chronic kidney disease or end stage renal disease: Secondary | ICD-10-CM | POA: Diagnosis not present

## 2020-01-12 DIAGNOSIS — D649 Anemia, unspecified: Secondary | ICD-10-CM | POA: Diagnosis not present

## 2020-01-12 DIAGNOSIS — L02416 Cutaneous abscess of left lower limb: Secondary | ICD-10-CM | POA: Diagnosis not present

## 2020-01-12 DIAGNOSIS — E1129 Type 2 diabetes mellitus with other diabetic kidney complication: Secondary | ICD-10-CM | POA: Diagnosis not present

## 2020-01-12 DIAGNOSIS — T879 Unspecified complications of amputation stump: Secondary | ICD-10-CM | POA: Diagnosis not present

## 2020-01-12 DIAGNOSIS — G8918 Other acute postprocedural pain: Secondary | ICD-10-CM | POA: Diagnosis not present

## 2020-01-12 DIAGNOSIS — T148XXA Other injury of unspecified body region, initial encounter: Secondary | ICD-10-CM | POA: Diagnosis not present

## 2020-01-12 HISTORY — PX: AMPUTATION: SHX166

## 2020-01-12 LAB — RENAL FUNCTION PANEL
Albumin: 1.8 g/dL — ABNORMAL LOW (ref 3.5–5.0)
Anion gap: 10 (ref 5–15)
BUN: 56 mg/dL — ABNORMAL HIGH (ref 6–20)
CO2: 25 mmol/L (ref 22–32)
Calcium: 8.4 mg/dL — ABNORMAL LOW (ref 8.9–10.3)
Chloride: 100 mmol/L (ref 98–111)
Creatinine, Ser: 3.98 mg/dL — ABNORMAL HIGH (ref 0.44–1.00)
GFR, Estimated: 13 mL/min — ABNORMAL LOW (ref >60)
Glucose, Bld: 199 mg/dL — ABNORMAL HIGH (ref 70–99)
Phosphorus: 4.3 mg/dL (ref 2.5–4.6)
Potassium: 3.9 mmol/L (ref 3.5–5.1)
Sodium: 135 mmol/L (ref 135–145)

## 2020-01-12 LAB — CBC
HCT: 28 % — ABNORMAL LOW (ref 36.0–46.0)
Hemoglobin: 8.8 g/dL — ABNORMAL LOW (ref 12.0–15.0)
MCH: 27.6 pg (ref 26.0–34.0)
MCHC: 31.4 g/dL (ref 30.0–36.0)
MCV: 87.8 fL (ref 80.0–100.0)
Platelets: 320 10*3/uL (ref 150–400)
RBC: 3.19 MIL/uL — ABNORMAL LOW (ref 3.87–5.11)
RDW: 15.1 % (ref 11.5–15.5)
WBC: 22.5 10*3/uL — ABNORMAL HIGH (ref 4.0–10.5)
nRBC: 0 % (ref 0.0–0.2)

## 2020-01-12 LAB — PTH, INTACT AND CALCIUM
Calcium, Total (PTH): 8.3 mg/dL — ABNORMAL LOW (ref 8.7–10.2)
PTH: 44 pg/mL (ref 15–65)

## 2020-01-12 LAB — GLUCOSE, CAPILLARY
Glucose-Capillary: 237 mg/dL — ABNORMAL HIGH (ref 70–99)
Glucose-Capillary: 213 mg/dL — ABNORMAL HIGH (ref 70–99)
Glucose-Capillary: 409 mg/dL — ABNORMAL HIGH (ref 70–99)
Glucose-Capillary: 307 mg/dL — ABNORMAL HIGH (ref 70–99)

## 2020-01-12 SURGERY — AMPUTATION BELOW KNEE
Anesthesia: General | Site: Knee | Laterality: Left

## 2020-01-12 MED ORDER — INSULIN GLARGINE 100 UNIT/ML ~~LOC~~ SOLN
38.0000 [IU] | Freq: Every day | SUBCUTANEOUS | Status: DC
  Administered 2020-01-12 – 2020-01-22 (×11): 38 [IU] via SUBCUTANEOUS
  Filled 2020-01-12 (×13): qty 0.38

## 2020-01-12 MED ORDER — DIPHENHYDRAMINE HCL 12.5 MG/5ML PO ELIX: 12.5000 mg | ORAL_SOLUTION | ORAL | Status: DC | PRN

## 2020-01-12 MED ORDER — MORPHINE SULFATE (PF) 2 MG/ML IV SOLN
2.0000 mg | INTRAVENOUS | Status: DC | PRN
  Administered 2020-01-12: 2 mg via INTRAVENOUS
  Filled 2020-01-12: qty 1

## 2020-01-12 MED ORDER — AMISULPRIDE (ANTIEMETIC) 5 MG/2ML IV SOLN: 10.0000 mg | Freq: Once | INTRAVENOUS | Status: DC | PRN

## 2020-01-12 MED ORDER — FENTANYL CITRATE (PF) 250 MCG/5ML IJ SOLN
INTRAMUSCULAR | Status: DC | PRN
  Administered 2020-01-12 (×2): 50 ug via INTRAVENOUS

## 2020-01-12 MED ORDER — ONDANSETRON HCL 4 MG/2ML IJ SOLN: 4.0000 mg | Freq: Four times a day (QID) | INTRAMUSCULAR | Status: DC | PRN

## 2020-01-12 MED ORDER — FENTANYL CITRATE (PF) 100 MCG/2ML IJ SOLN
INTRAMUSCULAR | Status: AC
  Filled 2020-01-12: qty 2

## 2020-01-12 MED ORDER — LIDOCAINE 2% (20 MG/ML) 5 ML SYRINGE
INTRAMUSCULAR | Status: DC | PRN
  Administered 2020-01-12: 60 mg via INTRAVENOUS

## 2020-01-12 MED ORDER — MIDAZOLAM HCL 2 MG/2ML IJ SOLN
INTRAMUSCULAR | Status: AC
  Filled 2020-01-12: qty 2

## 2020-01-12 MED ORDER — BUPIVACAINE LIPOSOME 1.3 % IJ SUSP
INTRAMUSCULAR | Status: DC | PRN
  Administered 2020-01-12: 10 mL

## 2020-01-12 MED ORDER — PROPOFOL 10 MG/ML IV BOLUS
INTRAVENOUS | Status: DC | PRN
  Administered 2020-01-12: 150 mg via INTRAVENOUS

## 2020-01-12 MED ORDER — OXYCODONE HCL 5 MG/5ML PO SOLN: 5.0000 mg | Freq: Once | ORAL | Status: DC | PRN

## 2020-01-12 MED ORDER — OXYCODONE HCL 5 MG PO TABS: 5.0000 mg | ORAL_TABLET | Freq: Once | ORAL | Status: DC | PRN

## 2020-01-12 MED ORDER — LIDOCAINE 2% (20 MG/ML) 5 ML SYRINGE
INTRAMUSCULAR | Status: AC
  Filled 2020-01-12: qty 5

## 2020-01-12 MED ORDER — OXYCODONE HCL 5 MG PO TABS
10.0000 mg | ORAL_TABLET | ORAL | Status: DC | PRN
  Administered 2020-01-12: 10 mg via ORAL
  Administered 2020-01-12 – 2020-01-13 (×5): 15 mg via ORAL
  Administered 2020-01-13: 10 mg via ORAL
  Administered 2020-01-14 – 2020-01-23 (×30): 15 mg via ORAL
  Filled 2020-01-12 (×23): qty 3
  Filled 2020-01-12: qty 2
  Filled 2020-01-12 (×12): qty 3

## 2020-01-12 MED ORDER — VANCOMYCIN HCL 1000 MG IV SOLR
INTRAVENOUS | Status: DC | PRN
  Administered 2020-01-12: 1000 mg

## 2020-01-12 MED ORDER — FENTANYL CITRATE (PF) 100 MCG/2ML IJ SOLN
25.0000 ug | INTRAMUSCULAR | Status: DC | PRN
  Administered 2020-01-12: 25 ug via INTRAVENOUS

## 2020-01-12 MED ORDER — DOCUSATE SODIUM 100 MG PO CAPS
100.0000 mg | ORAL_CAPSULE | Freq: Two times a day (BID) | ORAL | Status: DC
  Administered 2020-01-12 – 2020-01-18 (×12): 100 mg via ORAL
  Filled 2020-01-12 (×14): qty 1

## 2020-01-12 MED ORDER — ONDANSETRON HCL 4 MG PO TABS: 4.0000 mg | ORAL_TABLET | Freq: Four times a day (QID) | ORAL | Status: DC | PRN

## 2020-01-12 MED ORDER — ONDANSETRON HCL 4 MG/2ML IJ SOLN: 4.0000 mg | Freq: Once | INTRAMUSCULAR | Status: DC | PRN

## 2020-01-12 MED ORDER — ONDANSETRON HCL 4 MG/2ML IJ SOLN
INTRAMUSCULAR | Status: AC
  Filled 2020-01-12: qty 2

## 2020-01-12 MED ORDER — VANCOMYCIN HCL 1000 MG IV SOLR
INTRAVENOUS | Status: AC
  Filled 2020-01-12: qty 1000

## 2020-01-12 MED ORDER — ENOXAPARIN SODIUM 30 MG/0.3ML ~~LOC~~ SOLN
30.0000 mg | SUBCUTANEOUS | Status: DC
  Administered 2020-01-13 – 2020-01-18 (×6): 30 mg via SUBCUTANEOUS
  Filled 2020-01-12 (×6): qty 0.3

## 2020-01-12 MED ORDER — FENTANYL CITRATE (PF) 250 MCG/5ML IJ SOLN
INTRAMUSCULAR | Status: AC
  Filled 2020-01-12: qty 5

## 2020-01-12 MED ORDER — NEPRO/CARBSTEADY PO LIQD
237.0000 mL | Freq: Two times a day (BID) | ORAL | Status: DC
  Administered 2020-01-14 – 2020-01-18 (×6): 237 mL via ORAL

## 2020-01-12 MED ORDER — 0.9 % SODIUM CHLORIDE (POUR BTL) OPTIME
TOPICAL | Status: DC | PRN
  Administered 2020-01-12: 1000 mL

## 2020-01-12 MED ORDER — BUPIVACAINE HCL (PF) 0.5 % IJ SOLN
INTRAMUSCULAR | Status: DC | PRN
  Administered 2020-01-12 (×2): 15 mL via PERINEURAL

## 2020-01-12 MED ORDER — ACETAMINOPHEN 325 MG PO TABS
325.0000 mg | ORAL_TABLET | Freq: Four times a day (QID) | ORAL | Status: DC | PRN
  Administered 2020-01-20 – 2020-01-23 (×4): 650 mg via ORAL
  Filled 2020-01-12 (×4): qty 2

## 2020-01-12 MED ORDER — SENNA 8.6 MG PO TABS
1.0000 | ORAL_TABLET | Freq: Two times a day (BID) | ORAL | Status: DC
  Administered 2020-01-12 – 2020-01-21 (×11): 8.6 mg via ORAL
  Filled 2020-01-12 (×17): qty 1

## 2020-01-12 MED ORDER — OXYCODONE HCL 5 MG PO TABS
5.0000 mg | ORAL_TABLET | ORAL | Status: DC | PRN
  Filled 2020-01-12: qty 2

## 2020-01-12 MED ORDER — INSULIN ASPART 100 UNIT/ML ~~LOC~~ SOLN
SUBCUTANEOUS | Status: AC
  Filled 2020-01-12: qty 1

## 2020-01-12 MED ORDER — PROPOFOL 10 MG/ML IV BOLUS
INTRAVENOUS | Status: AC
  Filled 2020-01-12: qty 20

## 2020-01-12 MED ORDER — MIDAZOLAM HCL 5 MG/5ML IJ SOLN
INTRAMUSCULAR | Status: DC | PRN
  Administered 2020-01-12: 2 mg via INTRAVENOUS

## 2020-01-12 MED ORDER — CEFAZOLIN SODIUM-DEXTROSE 2-4 GM/100ML-% IV SOLN
INTRAVENOUS | Status: AC
  Filled 2020-01-12: qty 100

## 2020-01-12 MED ORDER — HYDROMORPHONE HCL 1 MG/ML IJ SOLN
0.5000 mg | INTRAMUSCULAR | Status: DC | PRN
  Administered 2020-01-19: 0.5 mg via INTRAVENOUS
  Administered 2020-01-20 – 2020-01-22 (×3): 1 mg via INTRAVENOUS
  Filled 2020-01-12 (×4): qty 1

## 2020-01-12 SURGICAL SUPPLY — 58 items
APL PRP STRL LF DISP 70% ISPRP (MISCELLANEOUS) ×1
BANDAGE ESMARK 6X9 LF (GAUZE/BANDAGES/DRESSINGS) IMPLANT
BLADE SAW RECIP 87.9 MT (BLADE) ×2 IMPLANT
BNDG CMPR 9X6 STRL LF SNTH (GAUZE/BANDAGES/DRESSINGS)
BNDG CMPR MED 15X6 ELC VLCR LF (GAUZE/BANDAGES/DRESSINGS) ×1
BNDG COHESIVE 6X5 TAN STRL LF (GAUZE/BANDAGES/DRESSINGS) ×2 IMPLANT
BNDG ELASTIC 6X15 VLCR STRL LF (GAUZE/BANDAGES/DRESSINGS) ×1 IMPLANT
BNDG ELASTIC 6X5.8 VLCR STR LF (GAUZE/BANDAGES/DRESSINGS) IMPLANT
BNDG ESMARK 6X9 LF (GAUZE/BANDAGES/DRESSINGS)
CHLORAPREP W/TINT 26 (MISCELLANEOUS) ×2 IMPLANT
COVER SURGICAL LIGHT HANDLE (MISCELLANEOUS) ×2 IMPLANT
COVER WAND RF STERILE (DRAPES) ×2 IMPLANT
CUFF TOURN SGL QUICK 34 (TOURNIQUET CUFF) ×2
CUFF TOURN SGL QUICK 42 (TOURNIQUET CUFF) IMPLANT
CUFF TRNQT CYL 34X4.125X (TOURNIQUET CUFF) ×1 IMPLANT
DRAPE INCISE IOBAN 66X45 STRL (DRAPES) ×2 IMPLANT
DRAPE U-SHAPE 47X51 STRL (DRAPES) ×4 IMPLANT
DRSG AQUACEL AG ADV 3.5X 6 (GAUZE/BANDAGES/DRESSINGS) ×1 IMPLANT
DRSG MEPITEL 4X7.2 (GAUZE/BANDAGES/DRESSINGS) ×2 IMPLANT
DRSG PAD ABDOMINAL 8X10 ST (GAUZE/BANDAGES/DRESSINGS) ×6 IMPLANT
ELECT CAUTERY BLADE 6.4 (BLADE) IMPLANT
ELECT REM PT RETURN 9FT ADLT (ELECTROSURGICAL) ×2
ELECTRODE REM PT RTRN 9FT ADLT (ELECTROSURGICAL) ×1 IMPLANT
EVACUATOR 1/8 PVC DRAIN (DRAIN) IMPLANT
GAUZE SPONGE 4X4 12PLY STRL (GAUZE/BANDAGES/DRESSINGS) ×2 IMPLANT
GAUZE SPONGE 4X4 12PLY STRL LF (GAUZE/BANDAGES/DRESSINGS) ×1 IMPLANT
GLOVE BIO SURGEON STRL SZ8 (GLOVE) ×2 IMPLANT
GLOVE BIOGEL PI IND STRL 8 (GLOVE) ×2 IMPLANT
GLOVE BIOGEL PI INDICATOR 8 (GLOVE) ×2
GLOVE ECLIPSE 8.0 STRL XLNG CF (GLOVE) ×4 IMPLANT
GOWN STRL REUS W/ TWL LRG LVL3 (GOWN DISPOSABLE) ×1 IMPLANT
GOWN STRL REUS W/ TWL XL LVL3 (GOWN DISPOSABLE) ×1 IMPLANT
GOWN STRL REUS W/TWL LRG LVL3 (GOWN DISPOSABLE) ×2
GOWN STRL REUS W/TWL XL LVL3 (GOWN DISPOSABLE) ×2
KIT BASIN OR (CUSTOM PROCEDURE TRAY) ×2 IMPLANT
KIT TURNOVER KIT B (KITS) ×2 IMPLANT
MANIFOLD NEPTUNE II (INSTRUMENTS) ×2 IMPLANT
NS IRRIG 1000ML POUR BTL (IV SOLUTION) ×2 IMPLANT
PACK GENERAL/GYN (CUSTOM PROCEDURE TRAY) ×2 IMPLANT
PAD ABD 8X10 STRL (GAUZE/BANDAGES/DRESSINGS) ×3 IMPLANT
PAD ARMBOARD 7.5X6 YLW CONV (MISCELLANEOUS) ×4 IMPLANT
PAD CAST 4YDX4 CTTN HI CHSV (CAST SUPPLIES) ×1 IMPLANT
PADDING CAST COTTON 4X4 STRL (CAST SUPPLIES) ×2
PADDING CAST COTTON 6X4 STRL (CAST SUPPLIES) ×2 IMPLANT
SPONGE LAP 18X18 RF (DISPOSABLE) IMPLANT
STAPLER VISISTAT 35W (STAPLE) IMPLANT
STOCKINETTE IMPERVIOUS LG (DRAPES) IMPLANT
SUT ETHILON 2 0 FS 18 (SUTURE) ×4 IMPLANT
SUT MNCRL AB 3-0 PS2 18 (SUTURE) ×2 IMPLANT
SUT PDS AB 1 CT  36 (SUTURE) ×4
SUT PDS AB 1 CT 36 (SUTURE) ×2 IMPLANT
SUT SILK 2 0 (SUTURE) ×2
SUT SILK 2-0 18XBRD TIE 12 (SUTURE) ×1 IMPLANT
SWAB COLLECTION DEVICE MRSA (MISCELLANEOUS) IMPLANT
SWAB CULTURE ESWAB REG 1ML (MISCELLANEOUS) IMPLANT
TOWEL GREEN STERILE (TOWEL DISPOSABLE) ×2 IMPLANT
TOWEL GREEN STERILE FF (TOWEL DISPOSABLE) ×2 IMPLANT
WATER STERILE IRR 1000ML POUR (IV SOLUTION) ×2 IMPLANT

## 2020-01-12 NOTE — Anesthesia Procedure Notes (Signed)
Anesthesia Regional Block: Adductor canal block   Pre-Anesthetic Checklist: ,, timeout performed, Correct Patient, Correct Site, Correct Laterality, Correct Procedure, Correct Position, site marked, Risks and benefits discussed,  Surgical consent,  Pre-op evaluation,  At surgeon's request and post-op pain management  Laterality: Left  Prep: chloraprep       Needles:  Injection technique: Single-shot  Needle Type: Echogenic Stimulator Needle     Needle Length: 10cm  Needle Gauge: 20     Additional Needles:   Procedures:,,,, ultrasound used (permanent image in chart),,,,  Narrative:  Start time: 01/12/2020 7:04 AM End time: 01/12/2020 7:08 AM Injection made incrementally with aspirations every 5 mL.  Performed by: Personally  Anesthesiologist: [name], MD  Additional Notes: Standard monitors applied. Skin prepped. Good needle visualization with ultrasound. Injection made in 5cc increments with no resistance to injection. Patient tolerated the procedure well.

## 2020-01-12 NOTE — Transfer of Care (Signed)
Immediate Anesthesia Transfer of Care Note  Patient: ERIKKA FOLLMER  Procedure(s) Performed: AMPUTATION BELOW KNEE (Left Knee)  Patient Location: PACU  Anesthesia Type:General  Level of Consciousness: drowsy, patient cooperative and responds to stimulation  Airway & Oxygen Therapy: Patient connected to face mask oxygen  Post-op Assessment: Report given to RN, Post -op Vital signs reviewed and stable and Patient moving all extremities X 4  Post vital signs: Reviewed and stable  Last Vitals:  Vitals Value Taken Time  BP 144/62 01/12/20 0841  Temp 36.2 C 01/12/20 0841  Pulse 64 01/12/20 0843  Resp 15 01/12/20 0843  SpO2 100 % 01/12/20 0843  Vitals shown include unvalidated device data.  Last Pain:  Vitals:   01/12/20 0841  TempSrc:   PainSc: 10-Worst pain ever      Patients Stated Pain Goal: 0 (57/89/78 4784)  Complications: No complications documented.

## 2020-01-12 NOTE — Anesthesia Postprocedure Evaluation (Signed)
Anesthesia Post Note  Patient: [name]  Procedure(s) Performed: AMPUTATION BELOW KNEE (Left Knee)     Patient location during evaluation: PACU Anesthesia Type: General Level of consciousness: awake and alert Pain management: pain level controlled Vital Signs Assessment: post-procedure vital signs reviewed and stable Respiratory status: spontaneous breathing, nonlabored ventilation and respiratory function stable Cardiovascular status: blood pressure returned to baseline and stable Postop Assessment: no apparent nausea or vomiting Anesthetic complications: no   No complications documented.  Last Vitals:  Vitals:   01/12/20 1015 01/12/20 1039  BP: (!) 153/70 (!) 158/63  Pulse: 71 69  Resp: 16   Temp: (!) 36.4 C 36.6 C  SpO2: 96% 99%    Last Pain:  Vitals:   01/12/20 1039  TempSrc: Oral  PainSc:                  [name]

## 2020-01-12 NOTE — Anesthesia Procedure Notes (Signed)
Procedure Name: LMA Insertion Date/Time: 01/12/2020 7:50 AM Performed by: Michele Rockers, CRNA Pre-anesthesia Checklist: Patient identified, Patient being monitored, Timeout performed, Emergency Drugs available and Suction available Patient Re-evaluated:Patient Re-evaluated prior to induction Oxygen Delivery Method: Circle System Utilized Preoxygenation: Pre-oxygenation with 100% oxygen Induction Type: IV induction Ventilation: Mask ventilation without difficulty LMA Size: 4.0 Number of attempts: 1 Placement Confirmation: positive ETCO2 and breath sounds checked- equal and bilateral Tube secured with: Tape Dental Injury: Teeth and Oropharynx as per pre-operative assessment

## 2020-01-12 NOTE — Progress Notes (Signed)
Nutrition Follow-up  DOCUMENTATION CODES:   Not applicable  INTERVENTION:   -Once diet is resumed, continue:  -Renal MVI daily -Nepro Shake po BID, each supplement provides 425 kcal and 19 grams protein  NUTRITION DIAGNOSIS:   Increased nutrient needs related to post-op healing as evidenced by estimated needs.  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 She presented to the ED from home via EMS after falling onto her L hip on concrete.  10/28- s/p Intramedullary fixation,Leftfemur. 11/8- s/p PROCEDURE: 1.Tunneled dialysis catheter placement (right internal jugular, 19 cm catheter) 2. Left upper extremity brachiocephalic AV fistula creation 11/11- s/p Procedure(s): Left below-knee amputation  Reviewed I/O's: -1.8 L x 24 hours and -1.4 L since 12/29/19  UOP: 800 ml x 24 hours  Pt down in OR for lt BKA at time of visit. Per MD notes, pt is also scheduled for her second HD treatment today.   Pt with fair appetite; noted meal completion 40-75%. She is currently NPO for surgery. Noted pt with variable acceptance of Nepro supplements (Nepro was changed from [employer] due to pt request and will continue due to variable meal intake).   Per TOC notes, pt desires to go home at discharge. Awaiting post-op therapy evaluations.  Labs reviewed: CBGS: [id number] (inpatient orders for glycemic control are 0-5 units insulin aspart daily at bedtime, 0-9 units insulin aspart TID with meals, 8 units insulin aspart TID with meals,  and 34 units insulin glargine daily).   Diet Order:   Diet Order            Diet  renal/carb modified with fluid restriction Diet-HS Snack? Nothing; Fluid restriction: 1200 mL Fluid; Room service appropriate? Yes; Fluid consistency: Thin  Diet effective now                 EDUCATION NEEDS:   Education needs have been addressed  Skin:  Skin Assessment: Skin Integrity Issues: Skin Integrity Issues:: Other (Comment) Incisions: L hip (10/28) Other: closed lt foot  Last BM:  01/08/20  Height:   Ht Readings from Last 1 Encounters:  12/28/19 5\' 10"  (1.778 m)    Weight:   Wt Readings from Last 1 Encounters:  01/11/20 93.3 kg   BMI:  Body mass index is 29.51 kg/m.  Estimated Nutritional Needs:   Kcal:  2100-2300  Protein:  105-120 grams  Fluid:  1000 ml + UOP    Loistine Chance, RD, LDN, [place] Registered Dietitian II Certified Diabetes Care and Education Specialist Please refer to [facility] for RD and/or RD on-call/weekend/after hours pager

## 2020-01-12 NOTE — Anesthesia Procedure Notes (Signed)
Anesthesia Regional Block: Popliteal block   Pre-Anesthetic Checklist: ,, timeout performed, Correct Patient, Correct Site, Correct Laterality, Correct Procedure, Correct Position, site marked, Risks and benefits discussed,  Surgical consent,  Pre-op evaluation,  At surgeon's request and post-op pain management  Laterality: Left  Prep: chloraprep       Needles:  Injection technique: Single-shot  Needle Type: Echogenic Stimulator Needle     Needle Length: 10cm  Needle Gauge: 20     Additional Needles:   Procedures:,,,, ultrasound used (permanent image in chart),,,,  Narrative:  Start time: 01/12/2020 7:08 AM End time: 01/12/2020 7:12 AM Injection made incrementally with aspirations every 5 mL.  Performed by: Personally  Anesthesiologist: [name], MD  Additional Notes: Standard monitors applied. Skin prepped. Good needle visualization with ultrasound. Injection made in 5cc increments with no resistance to injection. Patient tolerated the procedure well.

## 2020-01-12 NOTE — Progress Notes (Signed)
Follow - up round on patient to see how she is tolerating HD. Patient reports she is tolerating HD well. She reports surgical pain secondary to new BKA today. Assisted patient's RN with medication administration and also educated patient about her renvela tablet. Patient with no further concerns at this time. Will continue to follow as appropriate.   Dorthey Sawyer, RN  Dialysis Nurse Coordinator

## 2020-01-12 NOTE — Progress Notes (Signed)
PROGRESS NOTE    [name]   YIF:[id number]  DOB: [birth year]  DOA: 12/28/2019 PCP: Julian Hy, PA-C   Brief Narrative:  [name]  54 year old female with DM, HTN, CVA, CKD 5, status post left foot Chopart amputation 2014 managed by podiatry, left SFA revascularization in 2020, who came into the hospital after a ground-level fall resulting in left hip fracture.  Noted to have swollen legs in ED and a worsening Cr. Nephrology and ortho consulted in ED.  Noted to have leukocytosis which was worsening (35K). Had drainage from the left foot amputation site and began on Ancef ~ 11/3 for infection. ID consulted on 11/4.   MRI on 11/7 revealed an abscess and osteomyelitis 11/8 Tunneled dialysis cath  Subjective: Pain in left foot after amputation. She states today that neither the oral or IV pain meds are enough to control her pain.      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- Ancef discontinued - pain medications have been adjusted by surgery team      Diabetes mellitus type 2 - sugars uncontrolled on current dose of Lantus and Novolog (which she also takes at home)  - will increase Lantus today- she takes 50 U today - A1c on 10/28 was 6.2  GERD - cont PPI-   Essential HTN - cont Carvedilol, Hydralazine and Amlodipine - BP  elevated- partly due to acute pain     Acute on chronic anemia - likely due to hip fracture and acute infection - Hb dropped to 6.3 on 10/27- she received 2 U PRBC - 11/2- 1 U PRBC for Hb of 7.2 11/6 1 U PRBC for Hb of 7.3 - given  Feraheme on 11/6   Time spent in minutes: 30 DVT prophylaxis: enoxaparin (LOVENOX) injection 30 mg Start: 01/13/20 0800 SCDs Start: 01/12/20 1210 SCDs Start: 12/29/19 1621  Code Status: Full code Family Communication:  Disposition Plan:  Status is: Inpatient  Remains inpatient appropriate because:IV treatments appropriate due to intensity of illness or inability to take PO   Dispo: The patient is from: Home              Anticipated d/c is to: SNF              Anticipated d/c date is: > 3 days              Patient currently is not medically stable to d/c.  Consultants:   ID  Vascular  Nephrology   Ortho   Antimicrobials:  Anti-infectives (From admission, onward)   Start     Dose/Rate Route Frequency Ordered Stop   01/12/20 0806  vancomycin (VANCOCIN) powder  Status:  Discontinued          As needed 01/12/20 0806 01/12/20 0946   01/10/20 2000  ceFAZolin (ANCEF) IVPB 1 g/50 mL premix        1 g 100 mL/hr over 30 Minutes Intravenous Every 24 hours 01/10/20 1343     01/10/20 0600  ceFAZolin (ANCEF) IVPB 2g/100 mL premix  2 g 200 mL/hr over 30 Minutes Intravenous On call to O.R. 01/09/20 1952 01/11/20 0559   01/06/20 0400  ceFAZolin (ANCEF) IVPB 2g/100 mL premix  Status:  Discontinued        2 g 200 mL/hr over 30 Minutes Intravenous Every 12 hours 01/05/20 1709 01/10/20 1343   01/05/20 2200  ceFAZolin (ANCEF) IVPB 2g/100 mL premix  Status:  Discontinued        2 g 200 mL/hr over 30 Minutes Intravenous Every 8 hours 01/05/20 1707 01/05/20 1709   01/04/20 1700  ceFAZolin (ANCEF) IVPB 1 g/50 mL premix  Status:  Discontinued        1 g 100 mL/hr over 30 Minutes Intravenous Every 12 hours 01/04/20 1611 01/05/20 1707   12/29/19 1930  ceFAZolin (ANCEF) IVPB 2g/100 mL premix        2 g 200 mL/hr over 30 Minutes Intravenous Every 6 hours 12/29/19 1620 12/30/19 0201   12/29/19 1300  ceFAZolin (ANCEF) IVPB 2g/100 mL premix        2 g 200 mL/hr over 30 Minutes Intravenous  On call to O.R. 12/29/19 0113 12/29/19 1325       Objective: Vitals:   01/12/20 1000 01/12/20 1015 01/12/20 1039 01/12/20 1308  BP: (!) 157/62 (!) 153/70 (!) 158/63 (!) 180/68  Pulse: 70 71 69 77  Resp: 15 16  17   Temp:  (!) 97.5 F (36.4 C) 97.9 F (36.6 C) 98.5 F (36.9 C)  TempSrc:   Oral Oral  SpO2: 97% 96% 99% 96%  Weight:      Height:        Intake/Output Summary (Last 24 hours) at 01/12/2020 1534 Last data filed at 01/12/2020 1015 Gross per 24 hour  Intake 450 ml  Output 1850 ml  Net -1400 ml   Filed Weights   01/10/20 1007 01/11/20 1451 01/11/20 1730  Weight: 94 kg 94.3 kg 93.3 kg    Examination: General exam: Appears comfortable  HEENT: PERRLA, oral mucosa moist, no sclera icterus or thrush Respiratory system: Clear to auscultation. Respiratory effort normal. Cardiovascular system: S1 & S2 heard,  No murmurs  Gastrointestinal system: Abdomen soft, non-tender, nondistended. Normal bowel sounds   Central nervous system: Alert and oriented. No focal neurological deficits. Extremities: No cyanosis, clubbing or edema- dressing left stump not opened Skin: No rashes or ulcers Psychiatry:  Mood & affect appropriate.     Data Reviewed: I have personally reviewed following labs and imaging studies  CBC: Recent Labs  Lab 01/06/20 0522 01/06/20 0522 01/07/20 0107 01/07/20 0107 01/08/20 1237 01/09/20 0111 01/10/20 0405 01/11/20 0139 01/12/20 0028  WBC 21.2*   < > 15.6*   < > 18.9* 18.0* 19.5* 17.8* 22.5*  NEUTROABS 17.9*  --  12.5*  --   --   --   --   --   --   HGB 7.1*   < > 7.3*   < > 8.6* 8.2* 8.9* 8.7* 8.8*  HCT 22.6*   < > 22.5*   < > 25.9* 25.8* 27.5* 27.6* 28.0*  MCV 89.0   < > 86.9   < > 85.8 85.7 86.5 87.3 87.8  PLT 210   < > 231   < > 266 261 328 344 320   < > = values in this interval not displayed.   Basic Metabolic Panel: Recent Labs  Lab 01/06/20 0522 01/06/20 0522 01/07/20 0107 01/07/20 0107 01/08/20 1237 01/08/20 1237  01/09/20 0111 01/10/20 0405 01/11/20 0139 01/12/20 0028  NA 126*   < > 127*   < > 126*  --  127* 126* 133* 135  K 3.9   < > 4.2   < > 4.3  --  4.2 4.8 3.9 3.9  CL 93*   < > 92*   < > 92*  --  92* 92* 97* 100  CO2 20*   < > 22   < > 19*  --  21* 20* 24 25  GLUCOSE 248*   < > 193*   < > 223*  --  216* 320* 269* 199*  BUN 123*   < > 134*   < > 143*  --  148* 154* 94* 56*  CREATININE 6.64*   < > 7.33*   < > 7.55*  --  7.67* 7.90* 5.43* 3.98*  CALCIUM 7.4*   < > 7.7*   < > 8.2*   < > 8.3* 8.6* 8.4*  8.3* 8.4*  MG 1.5*  --  1.6*  --   --   --   --   --  1.8  --   PHOS 5.5*   < > 5.8*   < > 6.4*  --  6.7* 7.8* 5.9* 4.3   < > = values in this interval not displayed.   GFR: Estimated Creatinine Clearance: 20.5 mL/min (A) (by C-G formula based on SCr of 3.98 mg/dL (H)). Liver Function Tests: Recent Labs  Lab 01/06/20 0522 01/06/20 0522 01/07/20 0107 01/07/20 0107 01/08/20 1237 01/09/20 0111 01/10/20 0405 01/11/20 0139 01/12/20 0028  AST 31  --  21  --   --   --   --   --   --   ALT 12  --  6  --   --   --   --   --   --   ALKPHOS 223*  --  223*  --   --   --   --   --   --   BILITOT 0.9  --  0.7  --   --   --   --   --   --   PROT 5.8*  --  5.8*  --   --   --   --   --   --   ALBUMIN 1.9*   < > 1.7*   < > 1.7* 1.7* 1.7* 1.8* 1.8*   < > = values in this interval not displayed.   No results for input(s): LIPASE, AMYLASE in the last 168 hours. No results for input(s): AMMONIA in the last 168 hours. Coagulation Profile: No results for input(s): INR, PROTIME in the last 168 hours. Cardiac Enzymes: No results for input(s): CKTOTAL, CKMB, CKMBINDEX, TROPONINI in the last 168 hours. BNP (last 3 results) No results for input(s): PROBNP in the last 8760 hours. HbA1C: No results for input(s): HGBA1C in the last 72 hours. CBG: Recent Labs  Lab 01/11/20 1154 01/11/20 1809 01/11/20 2043 01/12/20 0848 01/12/20 1157  GLUCAP 151* 97 230* 237* 213*   Lipid Profile: No results for  input(s): CHOL, HDL, LDLCALC, TRIG, CHOLHDL, LDLDIRECT in the last 72 hours. Thyroid Function Tests: No results for input(s): TSH, T4TOTAL, FREET4, T3FREE, THYROIDAB in the last 72 hours. Anemia Panel: No results for input(s): VITAMINB12, FOLATE, FERRITIN, TIBC, IRON, RETICCTPCT in the last 72 hours. Urine analysis:    Component Value Date/Time   COLORURINE YELLOW 03/29/2014 [place] 03/29/2014 1259   LABSPEC 1.020 03/29/2014 1259   PHURINE 7.5 03/29/2014 1259  GLUCOSEU NEGATIVE 03/29/2014 1259   HGBUR TRACE-INTACT (A) 03/29/2014 1259   BILIRUBINUR neg 02/16/2017 1501   KETONESUR NEGATIVE 03/29/2014 1259   PROTEINUR Positive (A) 10/13/2017 1632   UROBILINOGEN 0.2 02/16/2017 1501   UROBILINOGEN 0.2 03/29/2014 1259   NITRITE neg 02/16/2017 1501   NITRITE NEGATIVE 03/29/2014 1259   LEUKOCYTESUR Negative 02/16/2017 1501   Sepsis Labs: @LABRCNTIP (procalcitonin:4,lacticidven:4) ) Recent Results (from the past 240 hour(s))  Culture, blood (routine x 2)     Status: None   Collection Time: 01/04/20  5:43 PM   Specimen: BLOOD LEFT HAND  Result Value Ref Range Status   Specimen Description   Final    BLOOD LEFT HAND Performed at [facility], [place] [address], [place], [place] [address]    Special Requests   Final    BOTTLES DRAWN AEROBIC AND ANAEROBIC Blood Culture adequate volume Performed at [place] [address], [place], [place] [address]    Culture   Final    NO GROWTH 5 DAYS Performed at [facility], [place] [address] [place], [place] [address]    Report Status 01/09/2020 FINAL  Final  Culture, blood (routine x 2)     Status: None   Collection Time: 01/04/20  5:51 PM   Specimen: BLOOD  Result Value Ref Range Status   Specimen Description   Final    BLOOD RIGHT ANTECUBITAL Performed at [place] [address], [place], [place] [address]    Special Requests   Final    BOTTLES DRAWN  AEROBIC ONLY Blood Culture results may not be optimal due to an inadequate volume of blood received in culture bottles Performed at [place] [address], [place], [place] [address]    Culture   Final    NO GROWTH 5 DAYS Performed at [facility], [place] [address], [place], [place] [address]    Report Status 01/09/2020 FINAL  Final  Surgical pcr screen     Status: None   Collection Time: 01/09/20  7:57 PM   Specimen: Nasal Mucosa; Nasal Swab  Result Value Ref Range Status   MRSA, PCR NEGATIVE NEGATIVE Final   Staphylococcus aureus NEGATIVE NEGATIVE Final    Comment: (NOTE) The Xpert SA Assay (FDA approved for NASAL specimens in patients 62 years of age and older), is one component of a comprehensive surveillance program. It is not intended to diagnose infection nor to guide or monitor treatment. Performed at [facility], [place] [address] [place], [place] [address]          Radiology Studies: No results found.    Scheduled Meds: . sodium chloride   Intravenous Once  . acetaminophen  650 mg Oral Q6H  . amLODipine  10 mg Oral Daily  . atorvastatin  40 mg Oral q1800  . carvedilol  25 mg Oral BID WC  . Chlorhexidine Gluconate Cloth  6 each Topical Q0600  . darbepoetin (ARANESP) injection - DIALYSIS  60 mcg Intravenous Q Tue-HD  . dexlansoprazole  60 mg Oral Daily  . docusate sodium  100 mg Oral BID  . DULoxetine  60 mg Oral Daily  . [START ON 01/13/2020] enoxaparin (LOVENOX) injection  30 mg Subcutaneous Q24H  . [START ON 01/13/2020] feeding supplement (NEPRO CARB STEADY)  237 mL Oral BID BM  . fentaNYL      . hydrALAZINE  100 mg Oral TID  . insulin aspart      .  insulin aspart  0-5 Units Subcutaneous QHS  . insulin aspart  0-9 Units Subcutaneous TID WC  . insulin aspart  8 Units Subcutaneous TID WC  . insulin glargine  34 Units Subcutaneous QHS  . multivitamin  1 tablet Oral QHS  . senna  1 tablet Oral BID  . sevelamer carbonate   800 mg Oral TID WC   Continuous Infusions: . sodium chloride Stopped (01/09/20 0759)  . sodium chloride 10 mL/hr at 01/10/20 1510  .  ceFAZolin (ANCEF) IV 1 g (01/11/20 2034)     LOS: 15 days      Debbe Odea, MD [facility] Pager: [email or web address] 01/12/2020, 3:34 PM

## 2020-01-12 NOTE — Progress Notes (Signed)
Patient returned to room from PACU. S/P left BKA.  Awake and alert. Ace wrap dressing intact. C/O pain. Medicated prior to return to room will continue to monitor. Call bell within reach.

## 2020-01-12 NOTE — Progress Notes (Addendum)
[facility] Progress Note  Subjective:  Underwent L BKA this am Seen in room. Feels ok, wants to rest today   Vitals:   01/12/20 1000 01/12/20 1015 01/12/20 1039 01/12/20 1308  BP: (!) 157/62 (!) 153/70 (!) 158/63 (!) 180/68  Pulse: 70 71 69 77  Resp: _0 Temp:  (!) 97.5 F (36.4 C) 97.9 F (36.6 C) 98.5 F (36.9 C)  TempSrc:   Oral Oral  SpO2: 97% 96% 99% 96%  Weight:      Height:        Exam: Gen alert, no distress MMM No jvd or bruits Chest clear bilat to bases, no rales RRR no MRG Abd soft ntnd no mass or ascites +bs Ext trace ankle edema, L BKA w dsg Neuro is alert, Ox 3 , nf RIJ TDC c/d/i, L AC fossa incision c/d/i, L radial pulse strong and hand is warm    Home meds:  - norvasc 5/ coreg 6.25/ asa / lipitor / hydralazine 50 tid  - insulin lantus 50u/ aspart 10u tid  - oxy IR prn/ cymbalta qd  - dexilant/ flexeril prn/ protonix 40     CXR - no acute disease     Last creat's outpatient >>      Aug 2021 - creat 4.9      Dec 23, 2019 - creat 5.2        Assessment/ Plan: 1. AoCKD 5 now ESRD- b/l creat 4.9- 5.2, eGFR 9- 11 from 8/21- 10/21. Pt f/b Dr Posey Pronto. Creat 6 on admission, now mid 7's, BUN ^^. IVF's given w/o improvement. S/p TDC and perm access 11/8, 1st HD 11/9, HD#2 11/10. Next HD tomorrow. No heparin.   CLIP to outpt HD. 2. Met acidosis - better, d/c po bicarb 3. Anemia ckd - Hb 7.6, was 6.3 on admit > got 2u prbc's, Hb in 8-9s now. Not symptomatic. Tsat low 10%, got 529m dose Feraheme on 11/06. Started ESA.  4. 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 Seen by ID, ancef dose adjusted. S/p L BKA 11/11 5. HTN/ vol  - BP's are stable / normal on her 3 home meds 6. DM - on insulin, per pmd 7. L hip fracture - admission diagnosis. sp ORIF on 10/28 per ortho 8. BMM - started binder, follow phos.  Check PTH - pending.  OLynnda ChildPA-C [facility] 01/12/2020,1:54 PM    Recent Labs  Lab 01/11/20 0139 01/12/20 0028  K 3.9 3.9  BUN 94* 56*  CREATININE 5.43* 3.98*  CALCIUM 8.4*  8.3* 8.4*  PHOS 5.9* 4.3  HGB 8.7* 8.8*   Inpatient medications: . sodium chloride   Intravenous Once  . acetaminophen  650 mg Oral Q6H  . amLODipine  10 mg Oral Daily  . atorvastatin  40 mg Oral q1800  . carvedilol  25 mg Oral BID WC  . Chlorhexidine Gluconate Cloth  6 each Topical Q0600  . darbepoetin (ARANESP) injection - DIALYSIS  60 mcg Intravenous Q Tue-HD  . dexlansoprazole  60 mg Oral Daily  . docusate sodium  100 mg Oral BID  . DULoxetine  60 mg Oral Daily  . [START ON 01/13/2020] enoxaparin (LOVENOX) injection  30 mg Subcutaneous Q24H  . [START ON 01/13/2020] feeding supplement (NEPRO CARB STEADY)  237 mL Oral BID BM  . fentaNYL      . hydrALAZINE  100 mg Oral TID  .  insulin aspart      . insulin aspart  0-5 Units Subcutaneous QHS  . insulin aspart  0-9 Units Subcutaneous TID WC  . insulin aspart  8 Units Subcutaneous TID WC  . insulin glargine  34 Units Subcutaneous QHS  . multivitamin  1 tablet Oral QHS  . senna  1 tablet Oral BID  . sevelamer carbonate  800 mg Oral TID WC   . sodium chloride Stopped (01/09/20 0759)  . sodium chloride 10 mL/hr at 01/10/20 1510  .  ceFAZolin (ANCEF) IV 1 g (01/11/20 2034)   sodium chloride, [START ON 01/13/2020] acetaminophen, alum & mag hydroxide-simeth, calcium carbonate, cyclobenzaprine, diphenhydrAMINE, guaiFENesin-dextromethorphan, hydrALAZINE, HYDROmorphone (DILAUDID) injection, lip balm, loratadine, menthol-cetylpyridinium **OR** phenol, morphine injection, Muscle Rub, ondansetron **OR** ondansetron (ZOFRAN) IV, oxyCODONE, oxyCODONE, polyvinyl alcohol, sodium chloride

## 2020-01-12 NOTE — Progress Notes (Signed)
Inpatient Diabetes Program Recommendations  AACE/ADA: New Consensus Statement on Inpatient Glycemic Control (2015)  Target Ranges:  Prepandial:   less than 140 mg/dL      Peak postprandial:   less than 180 mg/dL (1-2 hours)      Critically ill patients:  140 - 180 mg/dL   Lab Results  Component Value Date   GLUCAP 237 (H) 01/12/2020   HGBA1C 6.2 (H) 12/29/2019    Review of Glycemic Control Results for RADIANCE, DEADY (MRN [id number]) as of 01/12/2020 10:46  Ref. Range 01/11/2020 11:54 01/11/2020 18:09 01/11/2020 20:43 01/12/2020 08:48  Glucose-Capillary Latest Ref Range: 70 - 99 mg/dL 151 (H) 97 230 (H) 237 (H)  Noted Lantus held last night d/t "other". Assuming this was related to surgical procedure this AM. Will follow with current orders.  Thanks, Bronson Curb, MSN, RNC-OB Diabetes Coordinator [phone] (8a-5p)

## 2020-01-12 NOTE — Progress Notes (Signed)
Subjective: The patient presents today for L BKA.  She has osteomyelitis of the left foot with a chornic nonhealing diabetic foot ulcer.     Objective: Vital signs in last 24 hours: Temp:  [98.2 F (36.8 C)-99 F (37.2 C)] 98.6 F (37 C) 02/06/23 0556) Pulse Rate:  [66-71] 71 06-Feb-2023 0556) Resp:  [11-20] 17 02-06-23 0556) BP: (146-165)/(52-69) 158/55 (11/11 0556) SpO2:  [95 %-97 %] 96 % 02-06-2023 0556) Weight:  [93.3 kg-94.3 kg] 93.3 kg (11/10 1730)  Intake/Output from previous day: 11/10 0701 - 11/11 0700 In: 50 [IV Piggyback:50] Out: 1800 [Urine:800] Intake/Output this shift: No intake/output data recorded.  Recent Labs    01/10/20 0405 01/11/20 0139 February 06, 2020 0028  HGB 8.9* 8.7* 8.8*   Recent Labs    01/11/20 0139 Feb 06, 2020 0028  WBC 17.8* 22.5*  RBC 3.16* 3.19*  HCT 27.6* 28.0*  PLT 344 320   Recent Labs    01/11/20 0139 02/06/2020 0028  NA 133* 135  K 3.9 3.9  CL 97* 100  CO2 24 25  BUN 94* 56*  CREATININE 5.43* 3.98*  GLUCOSE 269* 199*  CALCIUM 8.4*  8.3* 8.4*   No results for input(s): LABPT, INR in the last 72 hours.  PE:  wn wd woman in nad.  L foot dressed anc dry. Skin healthy at the proposed op site.    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  The risks and benefits of the alternative treatment options have been discussed in detail.  The patient wishes to proceed with surgery and specifically understands risks of bleeding, infection, nerve damage, blood clots, need for additional surgery, amputation and death.      Wylene Simmer 02/06/2020, 7:25 AM

## 2020-01-12 NOTE — Op Note (Signed)
01/12/2020  8:49 AM  PATIENT:  [name]  54 y.o. female  PRE-OPERATIVE DIAGNOSIS: 1.  Nonhealing left diabetic foot ulcer 2.  Left calcaneus osteomyelitis  POST-OPERATIVE DIAGNOSIS: Same  Procedure(s): Left below-knee amputation  SURGEON:  Wylene Simmer, MD  ASSISTANT: Mechele Claude, PA-C  ANESTHESIA:   General, regional  EBL:  minimal   TOURNIQUET:   Total Tourniquet Time Documented: Thigh (Left) - 26 minutes Total: Thigh (Left) - 26 minutes  COMPLICATIONS:  None apparent  DISPOSITION:  Extubated, awake and stable to recovery.  INDICATION FOR PROCEDURE: The patient is a 54 year old female known to me from previous Chopart amputation.  She has a long history of a nonhealing diabetic ulcer on the plantar aspect of her stump.  She is admitted for a hip fracture which was fixed a couple of weeks ago by Dr. Lyla Glassing.  An MRI was obtained of her left foot showing osteomyelitis of the calcaneus.  She presents now for left below-knee amputation.  The risks and benefits of the alternative treatment options have been discussed in detail.  The patient wishes to proceed with surgery and specifically understands risks of bleeding, infection, nerve damage, blood clots, need for additional surgery, amputation and death.  PROCEDURE IN DETAIL: After preoperative consent was obtained and the correct operative site was identified, the patient was brought to the operating room and placed upon the operating table.  Preoperative antibiotics were administered.  Surgical timeout was taken.  General anesthesia was administered.  The left lower extremity was prepped and draped in standard sterile fashion with a tourniquet around the thigh.  The extremity was elevated and the tourniquet was inflated to 350 mmHg.  A posterior flap incision was marked on the skin 15 cm from the medial joint line.  The incision was made and dissection was carried down through the subcutaneous tissues to the tibial  periosteum.  The periosteum was elevated.  The skin edges and posterior soft tissues were protected.  The reciprocating saw was used to cut through the tibia.  The fibula was dissected free and was protected.  It was cut approximately 2 cm proximal from the tibial cut.  A bone hook was then used to retract the leg distally.  An amputation knife was used to cut through the posterior soft tissues beveling the flap appropriately.  The leg was then passed off the field.  The named vessels were all suture ligated with 2-0 silk ties.  Named nerves were sharply transected while held on gentle traction.  The tibial cut was beveled with a rasp.  The wound was irrigated copiously and sprinkled with vancomycin powder.  The gastrocnemius fascia was repaired to the anterior periosteum with imbricating sutures of 0 PDS.  Deep subcutaneous tissues were approximated with PDS.  Subcutaneous tissues were approximated with Monocryl, and the skin incision was closed with nylon.  Sterile dressings were applied followed by a compression wrap.  The tourniquet was released after application of the dressings.  The patient was awakened from anesthesia and transported to the recovery room in stable condition.   FOLLOW UP PLAN: Nonweightbearing on the left lower extremity.  The patient will return to the hospitalist service for management of her medical comorbidities.  She will start physical therapy and get a stump protector on postop day 0.  She may resume DVT prophylaxis on postop day 1.    Mechele Claude PA-C was present and scrubbed for the duration of the operative case. His assistance was essential in positioning  the patient, prepping and draping, gaining and maintaining exposure, performing the operation, closing and dressing the wounds and applying the splint.

## 2020-01-13 ENCOUNTER — Encounter (HOSPITAL_COMMUNITY): Payer: Self-pay | Admitting: Orthopedic Surgery

## 2020-01-13 ENCOUNTER — Ambulatory Visit: Payer: Medicaid Other | Admitting: Podiatry

## 2020-01-13 DIAGNOSIS — K5900 Constipation, unspecified: Secondary | ICD-10-CM | POA: Diagnosis not present

## 2020-01-13 DIAGNOSIS — L97524 Non-pressure chronic ulcer of other part of left foot with necrosis of bone: Secondary | ICD-10-CM | POA: Diagnosis not present

## 2020-01-13 DIAGNOSIS — T148XXA Other injury of unspecified body region, initial encounter: Secondary | ICD-10-CM | POA: Diagnosis not present

## 2020-01-13 DIAGNOSIS — N186 End stage renal disease: Secondary | ICD-10-CM | POA: Diagnosis not present

## 2020-01-13 DIAGNOSIS — D649 Anemia, unspecified: Secondary | ICD-10-CM | POA: Diagnosis not present

## 2020-01-13 DIAGNOSIS — M00852 Arthritis due to other bacteria, left hip: Secondary | ICD-10-CM | POA: Diagnosis not present

## 2020-01-13 DIAGNOSIS — E871 Hypo-osmolality and hyponatremia: Secondary | ICD-10-CM | POA: Diagnosis not present

## 2020-01-13 DIAGNOSIS — T879 Unspecified complications of amputation stump: Secondary | ICD-10-CM | POA: Diagnosis not present

## 2020-01-13 DIAGNOSIS — G8918 Other acute postprocedural pain: Secondary | ICD-10-CM

## 2020-01-13 DIAGNOSIS — N179 Acute kidney failure, unspecified: Secondary | ICD-10-CM | POA: Diagnosis not present

## 2020-01-13 DIAGNOSIS — N185 Chronic kidney disease, stage 5: Secondary | ICD-10-CM | POA: Diagnosis not present

## 2020-01-13 DIAGNOSIS — I120 Hypertensive chronic kidney disease with stage 5 chronic kidney disease or end stage renal disease: Secondary | ICD-10-CM | POA: Diagnosis not present

## 2020-01-13 DIAGNOSIS — E8770 Fluid overload, unspecified: Secondary | ICD-10-CM | POA: Diagnosis not present

## 2020-01-13 DIAGNOSIS — E1129 Type 2 diabetes mellitus with other diabetic kidney complication: Secondary | ICD-10-CM | POA: Diagnosis not present

## 2020-01-13 DIAGNOSIS — M8610 Other acute osteomyelitis, unspecified site: Secondary | ICD-10-CM | POA: Diagnosis not present

## 2020-01-13 LAB — CBC
HCT: 27.7 % — ABNORMAL LOW (ref 36.0–46.0)
Hemoglobin: 8.4 g/dL — ABNORMAL LOW (ref 12.0–15.0)
MCH: 27.6 pg (ref 26.0–34.0)
MCHC: 30.3 g/dL (ref 30.0–36.0)
MCV: 91.1 fL (ref 80.0–100.0)
Platelets: 337 10*3/uL (ref 150–400)
RBC: 3.04 MIL/uL — ABNORMAL LOW (ref 3.87–5.11)
RDW: 15.2 % (ref 11.5–15.5)
WBC: 20.5 10*3/uL — ABNORMAL HIGH (ref 4.0–10.5)
nRBC: 0 % (ref 0.0–0.2)

## 2020-01-13 LAB — RENAL FUNCTION PANEL
Albumin: 1.7 g/dL — ABNORMAL LOW (ref 3.5–5.0)
Anion gap: 11 (ref 5–15)
BUN: 62 mg/dL — ABNORMAL HIGH (ref 6–20)
CO2: 24 mmol/L (ref 22–32)
Calcium: 8 mg/dL — ABNORMAL LOW (ref 8.9–10.3)
Chloride: 97 mmol/L — ABNORMAL LOW (ref 98–111)
Creatinine, Ser: 4.8 mg/dL — ABNORMAL HIGH (ref 0.44–1.00)
GFR, Estimated: 10 mL/min — ABNORMAL LOW (ref >60)
Glucose, Bld: 198 mg/dL — ABNORMAL HIGH (ref 70–99)
Phosphorus: 4.8 mg/dL — ABNORMAL HIGH (ref 2.5–4.6)
Potassium: 4 mmol/L (ref 3.5–5.1)
Sodium: 132 mmol/L — ABNORMAL LOW (ref 135–145)

## 2020-01-13 LAB — GLUCOSE, CAPILLARY
Glucose-Capillary: 181 mg/dL — ABNORMAL HIGH (ref 70–99)
Glucose-Capillary: 137 mg/dL — ABNORMAL HIGH (ref 70–99)
Glucose-Capillary: 236 mg/dL — ABNORMAL HIGH (ref 70–99)
Glucose-Capillary: 243 mg/dL — ABNORMAL HIGH (ref 70–99)

## 2020-01-13 MED ORDER — SODIUM CHLORIDE 0.9 % IV SOLN: 100.0000 mL | INTRAVENOUS | Status: DC | PRN

## 2020-01-13 MED ORDER — ALTEPLASE 2 MG IJ SOLR: 2.0000 mg | Freq: Once | INTRAMUSCULAR | Status: DC | PRN

## 2020-01-13 MED ORDER — BISACODYL 10 MG RE SUPP
10.0000 mg | Freq: Once | RECTAL | Status: DC
  Filled 2020-01-13: qty 1

## 2020-01-13 MED ORDER — HEPARIN SODIUM (PORCINE) 1000 UNIT/ML DIALYSIS: 1000.0000 [IU] | INTRAMUSCULAR | Status: DC | PRN

## 2020-01-13 MED ORDER — POLYETHYLENE GLYCOL 3350 17 G PO PACK
17.0000 g | PACK | Freq: Every day | ORAL | Status: DC
  Administered 2020-01-18: 17 g via ORAL
  Filled 2020-01-13 (×8): qty 1

## 2020-01-13 MED ORDER — HEPARIN SODIUM (PORCINE) 1000 UNIT/ML IJ SOLN
INTRAMUSCULAR | Status: AC
  Filled 2020-01-13: qty 4

## 2020-01-13 MED ORDER — OXYCODONE HCL 5 MG PO TABS
ORAL_TABLET | ORAL | Status: AC
  Filled 2020-01-13: qty 3

## 2020-01-13 MED ORDER — PENTAFLUOROPROP-TETRAFLUOROETH EX AERO: 1.0000 "application " | INHALATION_SPRAY | CUTANEOUS | Status: DC | PRN

## 2020-01-13 NOTE — Progress Notes (Signed)
Occupational Therapy Treatment Patient Details Name: [name] MRN: [id number] DOB: 1965-09-26 Today's Date: 01/13/2020    History of present illness 54 year old female with DM, HTN, CKD 5, status post left foot Chopart amputation 2014 managed by podiatry, left SFA vascularization in 2020, prior CVA who came into the hospital after a ground-level fall resulting in left hip fracture,   S/P Intramedullary fixation, Left femur on 10/28. 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   OT comments  Pt continues to present with decreased functional performance. Discussing need for sitting balance activities and OOB; pt reporting pain significant at LLE and declined OOB activity. Pt participating in LLE exercises at bed level. Requiring Mod A +2 for repositioning in bed to optimize upright position in preparation to eat lunch. Continue to recommend dc to post-acute rehab and will continue to follow acutely as admitted.    Follow Up Recommendations  CIR    Equipment Recommendations  Wheelchair (measurements OT);3 in 1 bedside commode;Tub/shower bench    Recommendations for Other Services Rehab consult    Precautions / Restrictions Precautions Precautions: Fall Restrictions Weight Bearing Restrictions: Yes LLE Weight Bearing: Non weight bearing       Mobility Bed Mobility               General bed mobility comments: Refused mobility. Agreeable to reposition in bed with mod-maxA+2 and patient use of bed rails  Transfers                 General transfer comment: Refused transfer.     Balance       Sitting balance - Comments: refused sitting EOB                                   ADL either performed or assessed with clinical judgement   ADL Overall ADL's : Needs assistance/impaired                                       General ADL Comments: Declining to perform ADLs at this time. Self limiting behavior     Vision Patient Visual Report: No  change from baseline Vision Assessment?: No apparent visual deficits   Perception     Praxis      Cognition Arousal/Alertness: Awake/alert Behavior During Therapy: Flat affect Overall Cognitive Status: Within Functional Limits for tasks assessed                                 General Comments: Self limiting        Exercises Exercises: Amputee Amputee Exercises Quad Sets: AROM;Left;5 reps Gluteal Sets: AROM;5 reps Hip ABduction/ADduction: AAROM;Left;5 reps Hip Flexion/Marching: AROM;Left;5 reps   Shoulder Instructions       General Comments Patient refused OOB mobility and EOB mobility. Encouraged patient to participate so she is able to perform tasks when returning home. Patient continued to refused. Discussed discharge plan and suggested CIR, patient seemed interested but would need more information. Educated patient about positioning in bed to prevent hip and knee flexion contractures.     Pertinent Vitals/ Pain       Pain Assessment: Faces Faces Pain Scale: Hurts even more Pain Location: Left Hip and residual limb Pain Descriptors / Indicators: Guarding;Grimacing;Sore;Constant Pain Intervention(s): Monitored during session;Limited activity  within patient's tolerance;Repositioned  Home Living Family/patient expects to be discharged to:: Private residence Living Arrangements: Children (daughter)   Type of Home: Mobile home Home Access: Stairs to enter Entrance Stairs-Number of Steps: 3 Entrance Stairs-Rails: None Home Layout: One level               Home Equipment: Environmental consultant - 2 wheels;Bedside commode;Toilet riser   Additional Comments: Pt unsure if bathroom has tub or shower and if doors are w/c accessable.      Prior Functioning/Environment Level of Independence: Independent with assistive device(s)        Comments: Using knee walker   Frequency  Min 2X/week        Progress Toward Goals  OT Goals(current goals can now be found in  the care plan section)  Progress towards OT goals: Progressing toward goals  Acute Rehab OT Goals Patient Stated Goal: to reduce pain OT Goal Formulation: With patient Time For Goal Achievement: 01/13/20 Potential to Achieve Goals: Good ADL Goals Pt Will Perform Lower Body Dressing: with supervision;with adaptive equipment;sit to/from stand;sitting/lateral leans Pt Will Transfer to Toilet: with supervision;squat pivot transfer;bedside commode Pt Will Perform Toileting - Clothing Manipulation and hygiene: with supervision;sitting/lateral leans;sit to/from stand Additional ADL Goal #1: Patient will be supervision level for bed mobility in preparation for self care tasks.  Plan Discharge plan needs to be updated    Co-evaluation    PT/OT/SLP Co-Evaluation/Treatment: Yes Reason for Co-Treatment: For patient/therapist safety;To address functional/ADL transfers PT goals addressed during session: Strengthening/ROM OT goals addressed during session: ADL's and self-care      AM-PAC OT "6 Clicks" Daily Activity     Outcome Measure   Help from another person eating meals?: None Help from another person taking care of personal grooming?: A Little Help from another person toileting, which includes using toliet, bedpan, or urinal?: Total Help from another person bathing (including washing, rinsing, drying)?: A Lot Help from another person to put on and taking off regular upper body clothing?: A Little Help from another person to put on and taking off regular lower body clothing?: A Lot 6 Click Score: 15    End of Session    OT Visit Diagnosis: Pain;Muscle weakness (generalized) (M62.81);Unsteadiness on feet (R26.81) Pain - Right/Left: Left Pain - part of body: Hip;Ankle and joints of foot   Activity Tolerance Patient limited by pain   Patient Left in bed;with call bell/phone within reach;with nursing/sitter in room   Nurse Communication Mobility status        Time: 1355-1429 OT  Time Calculation (min): 34 min  Charges: OT General Charges $OT Visit: 1 Visit OT Treatments $Therapeutic Exercise: 8-22 mins  [place], OTR/L Acute Rehab Pager: [phone] Office: [place] 01/13/2020, 5:02 PM

## 2020-01-13 NOTE — Progress Notes (Signed)
Physical Therapy Treatment & Re-Evaluation Patient Details Name: [name] MRN: [id number] DOB: 06-17-1965 Today's Date: 01/13/2020    History of Present Illness 54 year old female with DM, HTN, CKD 5, status post left foot Chopart amputation 2014 managed by podiatry, left SFA vascularization in 2020, prior CVA who came into the hospital after a ground-level fall resulting in left hip fracture,   S/P Intramedullary fixation, Left femur on 10/28. 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    PT Comments    Re-eval completed this date. Patient refused OOB mobility and sitting EOB this session, encouragement and education provided on importance of mobility and preparing for returning home, patient continued to refuse. Discussed discharge plan and suggestion of CIR, patient seemed interested but more information needed. Patient agreeable to perform bed level exercises. Educated patient about positioning in bed to prevent hip and knee flexion contractures. Patient will continue to benefit from skilled PT services during acute stay to address listed deficits (see PT problem list). Recommend CIR following discharge to maximize functional mobility and independence.     Follow Up Recommendations  CIR;Supervision/Assistance - 24 hour     Equipment Recommendations  Wheelchair (measurements PT);Wheelchair cushion (measurements PT);Rolling Ivo Moga with 5" wheels;3in1 (PT)    Recommendations for Other Services Rehab consult     Precautions / Restrictions Precautions Precautions: Fall Restrictions Weight Bearing Restrictions: Yes LLE Weight Bearing: Non weight bearing    Mobility  Bed Mobility               General bed mobility comments: Refused mobility. Agreeable to reposition in bed with mod-maxA+2 and patient use of bed rails  Transfers                 General transfer comment: Refused transfer.   Ambulation/Gait                 Stairs             Wheelchair  Mobility    Modified Rankin (Stroke Patients Only)       Balance       Sitting balance - Comments: refused sitting EOB                                    Cognition Arousal/Alertness: Awake/alert Behavior During Therapy: Flat affect Overall Cognitive Status: Within Functional Limits for tasks assessed                                 General Comments: Self limiting      Exercises Amputee Exercises Quad Sets: AROM;Left;5 reps Gluteal Sets: AROM;5 reps Hip ABduction/ADduction: AAROM;Left;5 reps Hip Flexion/Marching: AROM;Left;5 reps    General Comments General comments (skin integrity, edema, etc.): Patient refused OOB mobility and EOB mobility. Encouraged patient to participate so she is able to perform tasks when returning home. Patient continued to refused. Discussed discharge plan and suggested CIR, patient seemed interested but would need more information. Educated patient about positioning in bed to prevent hip and knee flexion contractures.       Pertinent Vitals/Pain Pain Assessment: Faces Faces Pain Scale: Hurts even more Pain Location: Left Hip and residual limb Pain Descriptors / Indicators: Guarding;Grimacing;Sore;Constant Pain Intervention(s): Monitored during session;Limited activity within patient's tolerance;Repositioned    Home Living Family/patient expects to be discharged to:: Private residence Living Arrangements: Children (daughter)   Type  of Home: Mobile home Home Access: Stairs to enter Entrance Stairs-Rails: None Home Layout: One level Home Equipment: Environmental consultant - 2 wheels;Bedside commode;Toilet riser Additional Comments: Pt unsure if bathroom has tub or shower and if doors are w/c accessable.    Prior Function Level of Independence: Independent with assistive device(s)      Comments: Using knee [name]   PT Goals (current goals can now be found in the care plan section) Acute Rehab PT Goals Patient Stated Goal: to  reduce pain PT Goal Formulation: With patient Time For Goal Achievement: 01/25/20 Potential to Achieve Goals: Fair Progress towards PT goals: Progressing toward goals    Frequency    Min 3X/week      PT Plan Current plan remains appropriate    Co-evaluation PT/OT/SLP Co-Evaluation/Treatment: Yes Reason for Co-Treatment: For patient/therapist safety;To address functional/ADL transfers PT goals addressed during session: Strengthening/ROM        AM-PAC PT "6 Clicks" Mobility   Outcome Measure  Help needed turning from your back to your side while in a flat bed without using bedrails?: A Lot Help needed moving from lying on your back to sitting on the side of a flat bed without using bedrails?: A Lot Help needed moving to and from a bed to a chair (including a wheelchair)?: A Lot Help needed standing up from a chair using your arms (e.g., wheelchair or bedside chair)?: A Lot Help needed to walk in hospital room?: Total Help needed climbing 3-5 steps with a railing? : Total 6 Click Score: 10    End of Session   Activity Tolerance: Treatment limited secondary to agitation Patient left: in bed;with call bell/phone within reach Nurse Communication: Mobility status PT Visit Diagnosis: Muscle weakness (generalized) (M62.81);Other abnormalities of gait and mobility (R26.89) Pain - Right/Left: Left Pain - part of body: Hip;Knee     Time: 4656-8127 PT Time Calculation (min) (ACUTE ONLY): 27 min  Charges:  $Therapeutic Exercise: 8-22 mins                     [name], PT, DPT Acute Rehabilitation Services Pager [phone] Office [phone]    [name] 01/13/2020, 3:44 PM

## 2020-01-13 NOTE — Progress Notes (Signed)
   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 Large Plantar Open wound at the left Foot with complex soft tissue abscess     Extensive OM of the calcaneus and numerous small bone abscesses      H/o chronic OM of the left foot s/p chopart amputation in 2015 with chronic non healing ulcer   -   S/P LEFT BKA on 11/11. OR note reviewed  4. Type 2 DM with peripheral neuropathy 5, ESRD on HD  Recommendations Given adequate source control of the infection with a Left BKA, recommend to DC cefazolin Will sign off  Please call us with questions or concerns.   Rest of the management as per the primary team. Thank you for the consult. Please page with pertinent questions or concerns.  Rosiland Oz, MD [facility]   To contact the attending provider between 8A-5P or the covering provider during after hours 5P-8A, please log into the web site [email or web address] and access using universal  password for that web site. If you do not have the password, please call the hospital operator.

## 2020-01-13 NOTE — Progress Notes (Addendum)
PROGRESS NOTE    [name]   SWN:[id number]  DOB: 03-12-1965  DOA: 12/28/2019 PCP: Julian Hy, PA-C   Brief Narrative:  [name]  54 year old female with DM, HTN, CVA, CKD 5, status post left foot Chopart amputation 2014 managed by podiatry, left SFA revascularization in 2020, who came into the hospital after a ground-level fall resulting in left hip fracture.  Noted to have swollen legs in ED and a worsening Cr. Nephrology and ortho consulted in ED.  Noted to have leukocytosis which was worsening (35K). Had drainage from the left foot amputation site and began on Ancef ~ 11/3 for infection. ID consulted on 11/4.   MRI on 11/7 revealed an abscess and osteomyelitis 11/8 Tunneled dialysis cath  Subjective: Pain in foot better controlled. No BM since Tues. No nausea or abdominal pain. Eating OK.     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 Stress response - follow    Diabetes mellitus type 2 - she takes Lantus and Novolog at home  - Fasting  sugar better controlled today on 38 U Lantus - she takes 50 U of Lantus a day at home - on Novolog 8 U with meals + SSI -  f/u on mealtime sugars - A1c on 10/28 was 6.2  GERD - cont PPI-   Essential HTN - cont Carvedilol, Hydralazine and Amlodipine - BP  elevated- partly due to acute pain     Acute on chronic anemia - likely due to hip fracture and acute infection - Hb dropped to 6.3 on 10/27- she received 2 U PRBC - 11/2- 1 U PRBC for Hb of 7.2 11/6 1 U PRBC for Hb of 7.3 - given Feraheme on 11/6   Time spent in minutes: 30 DVT prophylaxis: enoxaparin (LOVENOX) injection 30 mg Start: 01/13/20 0800 SCDs Start: 01/12/20 1210 SCDs Start: 12/29/19 1621  Code Status: Full code Family Communication:  Disposition Plan:  Status is: Inpatient  Remains inpatient appropriate because:IV treatments appropriate due to intensity of illness or inability to take PO   Dispo: The patient is from: Home              Anticipated d/c is to: SNF              Anticipated d/c date is: > 3 days              Patient currently is not medically stable to d/c.  Consultants:   ID  Vascular  Nephrology   Ortho   Antimicrobials:  Anti-infectives (From admission, onward)  Start     Dose/Rate Route Frequency Ordered Stop   01/12/20 0806  vancomycin (VANCOCIN) powder  Status:  Discontinued          As needed 01/12/20 0806 01/12/20 0946   01/10/20 2000  ceFAZolin (ANCEF) IVPB 1 g/50 mL premix        1 g 100 mL/hr over 30 Minutes Intravenous Every 24 hours 01/10/20 1343     01/10/20 0600  ceFAZolin (ANCEF) IVPB 2g/100 mL premix        2 g 200 mL/hr over 30 Minutes Intravenous On call to O.R. 01/09/20 1952 01/11/20 0559   01/06/20 0400  ceFAZolin (ANCEF) IVPB 2g/100 mL premix  Status:  Discontinued        2 g 200 mL/hr over 30 Minutes Intravenous Every 12 hours 01/05/20 1709 01/10/20 1343   01/05/20 2200  ceFAZolin (ANCEF) IVPB 2g/100 mL premix  Status:  Discontinued        2 g 200 mL/hr  over 30 Minutes Intravenous Every 8 hours 01/05/20 1707 01/05/20 1709   01/04/20 1700  ceFAZolin (ANCEF) IVPB 1 g/50 mL premix  Status:  Discontinued        1 g 100 mL/hr over 30 Minutes Intravenous Every 12 hours 01/04/20 1611 01/05/20 1707   12/29/19 1930  ceFAZolin (ANCEF) IVPB 2g/100 mL premix        2 g 200 mL/hr over 30 Minutes Intravenous Every 6 hours 12/29/19 1620 12/30/19 0201   12/29/19 1300  ceFAZolin (ANCEF) IVPB 2g/100 mL premix        2 g 200 mL/hr over 30 Minutes Intravenous On call to O.R. 12/29/19 0113 12/29/19 1325       Objective: Vitals:   01/13/20 0900 01/13/20 1030 01/13/20 1036 01/13/20 1102  BP: (!) 164/55 (!) 157/60 (!) 159/56 (!) 164/59  Pulse:    74  Resp: 13 14 13 18   Temp:   99.8 F (37.7 C) 98.6 F (37 C)  TempSrc:   Oral Oral  SpO2:    96%  Weight:      Height:        Intake/Output Summary (Last 24 hours) at 01/13/2020 1542 Last data filed at 01/13/2020 1415 Gross per 24 hour  Intake 0 ml  Output 2200 ml  Net -2200 ml   Filed Weights   01/11/20 1451 01/11/20 1730 01/13/20 0725  Weight: 94.3 kg 93.3 kg 92.6 kg    Examination: General exam: Appears comfortable  HEENT: PERRLA, oral mucosa moist, no sclera icterus or thrush Respiratory system: Clear to auscultation. Respiratory effort normal. Cardiovascular system: S1 & S2 heard,  No murmurs  Gastrointestinal system: Abdomen soft, non-tender, nondistended. Normal bowel sounds   Central nervous system: Alert and oriented. No focal neurological deficits. Extremities: No cyanosis, clubbing or edema- left BKA- dressing not opened Skin: No rashes or ulcers Psychiatry:  Mood & affect appropriate.   Data Reviewed: I have personally reviewed following labs and imaging studies  CBC: Recent Labs  Lab 01/07/20 0107 01/08/20 1237 01/09/20 0111 01/10/20 0405 01/11/20 0139 01/12/20 0028 01/13/20 0808  WBC 15.6*   < > 18.0* 19.5* 17.8* 22.5* 20.5*  NEUTROABS 12.5*  --   --   --   --   --    --   HGB 7.3*   < > 8.2* 8.9* 8.7* 8.8* 8.4*  HCT 22.5*   < > 25.8* 27.5* 27.6* 28.0* 27.7*  MCV 86.9   < > 85.7 86.5 87.3 87.8 91.1  PLT 231   < >  261 328 344 320 337   < > = values in this interval not displayed.   Basic Metabolic Panel: Recent Labs  Lab 01/07/20 0107 01/08/20 1237 01/09/20 0111 01/09/20 0111 01/10/20 0405 01/11/20 0139 01/12/20 0028 01/13/20 0807  NA 127*   < > 127*  --  126* 133* 135 132*  K 4.2   < > 4.2  --  4.8 3.9 3.9 4.0  CL 92*   < > 92*  --  92* 97* 100 97*  CO2 22   < > 21*  --  20* 24 25 24   GLUCOSE 193*   < > 216*  --  320* 269* 199* 198*  BUN 134*   < > 148*  --  154* 94* 56* 62*  CREATININE 7.33*   < > 7.67*  --  7.90* 5.43* 3.98* 4.80*  CALCIUM 7.7*   < > 8.3*   < > 8.6* 8.4*  8.3* 8.4* 8.0*  MG 1.6*  --   --   --   --  1.8  --   --   PHOS 5.8*   < > 6.7*  --  7.8* 5.9* 4.3 4.8*   < > = values in this interval not displayed.   GFR: Estimated Creatinine Clearance: 16.9 mL/min (A) (by C-G formula based on SCr of 4.8 mg/dL (H)). Liver Function Tests: Recent Labs  Lab 01/07/20 0107 01/08/20 1237 01/09/20 0111 01/10/20 0405 01/11/20 0139 01/12/20 0028 01/13/20 0807  AST 21  --   --   --   --   --   --   ALT 6  --   --   --   --   --   --   ALKPHOS 223*  --   --   --   --   --   --   BILITOT 0.7  --   --   --   --   --   --   PROT 5.8*  --   --   --   --   --   --   ALBUMIN 1.7*   < > 1.7* 1.7* 1.8* 1.8* 1.7*   < > = values in this interval not displayed.   No results for input(s): LIPASE, AMYLASE in the last 168 hours. No results for input(s): AMMONIA in the last 168 hours. Coagulation Profile: No results for input(s): INR, PROTIME in the last 168 hours. Cardiac Enzymes: No results for input(s): CKTOTAL, CKMB, CKMBINDEX, TROPONINI in the last 168 hours. BNP (last 3 results) No results for input(s): PROBNP in the last 8760 hours. HbA1C: No results for input(s): HGBA1C in the last 72 hours. CBG: Recent Labs  Lab 01/12/20 1157  01/12/20 1948 01/12/20 2202 01/13/20 0645 01/13/20 1106  GLUCAP 213* 409* 307* 181* 137*   Lipid Profile: No results for input(s): CHOL, HDL, LDLCALC, TRIG, CHOLHDL, LDLDIRECT in the last 72 hours. Thyroid Function Tests: No results for input(s): TSH, T4TOTAL, FREET4, T3FREE, THYROIDAB in the last 72 hours. Anemia Panel: No results for input(s): VITAMINB12, FOLATE, FERRITIN, TIBC, IRON, RETICCTPCT in the last 72 hours. Urine analysis:    Component Value Date/Time   COLORURINE YELLOW 03/29/2014 [place] 03/29/2014 1259   LABSPEC 1.020 03/29/2014 1259   PHURINE 7.5 03/29/2014 1259   GLUCOSEU NEGATIVE 03/29/2014 1259   HGBUR TRACE-INTACT (A) 03/29/2014 1259   BILIRUBINUR neg 02/16/2017 1501   KETONESUR NEGATIVE 03/29/2014 1259   PROTEINUR Positive (A) 10/13/2017 1632   UROBILINOGEN 0.2  02/16/2017 1501   UROBILINOGEN 0.2 03/29/2014 1259   NITRITE neg 02/16/2017 1501   NITRITE NEGATIVE 03/29/2014 1259   LEUKOCYTESUR Negative 02/16/2017 1501   Sepsis Labs: @LABRCNTIP (procalcitonin:4,lacticidven:4) ) Recent Results (from the past 240 hour(s))  Culture, blood (routine x 2)     Status: None   Collection Time: 01/04/20  5:43 PM   Specimen: BLOOD LEFT HAND  Result Value Ref Range Status   Specimen Description   Final    BLOOD LEFT HAND Performed at [facility] [address], [place], [place] [address]    Special Requests   Final    BOTTLES DRAWN AEROBIC AND ANAEROBIC Blood Culture adequate volume Performed at [facility] [address], [place], [place] [address]    Culture   Final    NO GROWTH 5 DAYS Performed at [facility], [place] [address] [place], [place] [address]    Report Status 01/09/2020 FINAL  Final  Culture, blood (routine x 2)     Status: None   Collection Time: 01/04/20  5:51 PM   Specimen: BLOOD  Result Value Ref Range Status   Specimen Description   Final    BLOOD RIGHT  ANTECUBITAL Performed at [facility] [address], [place], [place] [address]    Special Requests   Final    BOTTLES DRAWN AEROBIC ONLY Blood Culture results may not be optimal due to an inadequate volume of blood received in culture bottles Performed at [facility] [address] [place], [place] [address]    Culture   Final    NO GROWTH 5 DAYS Performed at [facility], [place] [address], [place], [place] [address]    Report Status 01/09/2020 FINAL  Final  Surgical pcr screen     Status: None   Collection Time: 01/09/20  7:57 PM   Specimen: Nasal Mucosa; Nasal Swab  Result Value Ref Range Status   MRSA, PCR NEGATIVE NEGATIVE Final   Staphylococcus aureus NEGATIVE NEGATIVE Final    Comment: (NOTE) The Xpert SA Assay (FDA approved for NASAL specimens in patients 14 years of age and older), is one component of a comprehensive surveillance program. It is not intended to diagnose infection nor to guide or monitor treatment. Performed at [facility], [place] [address] [place], [place] [address]          Radiology Studies: No results found.    Scheduled Meds: . sodium chloride   Intravenous Once  . acetaminophen  650 mg Oral Q6H  . amLODipine  10 mg Oral Daily  . atorvastatin  40 mg Oral q1800  . bisacodyl  10 mg Rectal Once  . carvedilol  25 mg Oral BID WC  . Chlorhexidine Gluconate Cloth  6 each Topical Q0600  . darbepoetin (ARANESP) injection - DIALYSIS  60 mcg Intravenous Q Tue-HD  . dexlansoprazole  60 mg Oral Daily  . docusate sodium  100 mg Oral BID  . DULoxetine  60 mg Oral Daily  . enoxaparin (LOVENOX) injection  30 mg Subcutaneous Q24H  . feeding supplement (NEPRO CARB STEADY)  237 mL Oral BID BM  . heparin sodium (porcine)      . hydrALAZINE  100 mg Oral TID  . insulin aspart  0-5 Units Subcutaneous QHS  . insulin aspart  0-9 Units Subcutaneous TID WC  . insulin aspart  8 Units Subcutaneous TID WC  .  insulin glargine  38 Units Subcutaneous QHS  .  multivitamin  1 tablet Oral QHS  . polyethylene glycol  17 g Oral Daily  . senna  1 tablet Oral BID  . sevelamer carbonate  800 mg Oral TID WC   Continuous Infusions: . sodium chloride Stopped (01/09/20 0759)  . sodium chloride 10 mL/hr at 01/10/20 1510  .  ceFAZolin (ANCEF) IV 1 g (01/12/20 2049)     LOS: 16 days      Debbe Odea, MD [facility] Pager: [email or web address] 01/13/2020, 3:42 PM

## 2020-01-13 NOTE — Progress Notes (Signed)
Pharmacy Antibiotic Note  [name] is a 54 y.o. female with CKD and s/p left chopart amputation in 2014, presented to the ED  on 12/28/2019 s/p fall with femur fracture.  She underwent left femur intramedullary fixation on 10/28. Patient with continued purulent drainage and concern for deeper infection.  Pharmacy is consulted to dose Ancef for possible osteomyelitis.  s/p TDC and perm access 11/8, 1st HD 11/9, HD#2 11/10, #3 11/12.  11/2 left foot xray - soft tissue swelling noted overstump, osteo cannot be excluded S/p L BKA 11/11 Afebrile, WBC up to 22.5>20.5  Plan: Continue Ancef  1gm IV Q24H F/U HD schedule/tolerance, clinical progress    Height: 5\' 10"  (177.8 cm) Weight: 92.6 kg (204 lb 2.3 oz) IBW/kg (Calculated) : 68.5  Temp (24hrs), Avg:98.8 F (37.1 C), Min:98.4 F (36.9 C), Max:99.8 F (37.7 C)  Recent Labs  Lab 01/09/20 0111 01/10/20 0405 01/11/20 0139 01/12/20 0028 01/13/20 0807 01/13/20 0808  WBC 18.0* 19.5* 17.8* 22.5*  --  20.5*  CREATININE 7.67* 7.90* 5.43* 3.98* 4.80*  --     Estimated Creatinine Clearance: 16.9 mL/min (A) (by C-G formula based on SCr of 4.8 mg/dL (H)).    Allergies  Allergen Reactions  . Lidocaine Itching  . Trazodone Swelling  . Latex Rash   Ancef 11/3 >>  11/3 BCx - negative  11/3 surgical PCR - MRSA nega, MSSA positive  11/8 surgical PCR- MRSA neg,  MSSA negative   Nicole Cella, RPh Clinical Pharmacist   01/13/2020, 11:29 AM

## 2020-01-13 NOTE — Progress Notes (Signed)
Renal Navigator attempted to call patient to follow up regarding her discharge plan/address, however, she did not answer.  Navigator will follow up when address is provided as to where she will be living at discharge from hospital. At this time, patient's outpatient HD is arranged at [facility], as this is the clinic closest to the address patient provided to Navigator and that is listed for her in Epic.  Alphonzo Cruise, [place] Renal Navigator [phone]

## 2020-01-13 NOTE — TOC Progression Note (Addendum)
Transition of Care [facility]) - Progression Note    Patient Details  Name: [name] MRN: [id number] Date of Birth: Feb 26, 1966  Transition of Care [facility]) CM/SW Contact  Jacalyn Lefevre Edson Snowball, RN Phone Number: 01/13/2020, 11:21 AM  Clinical Narrative:     PT to see patient s/p BKA.   Prior PT recommended SNF. Discussed with patient. Patient voiced understanding but prefers to return to home at discharge. NCM called multiple home health agencies and [facility] accepted, however, patient will be discharging to a [place] address not a [place] address. [facility] does not service [place].   Patient does not have the [place] address, however her daughter does. NCM asked to call daughter , patient declined stating NCM cannot call due to daughter being at work. NCM explained will need address before NCM can call home health agencies. Patient will message her daughter. NCM will check back with patient for address. Patient will let nurse know when she has address. Jaclyn Shaggy will also need address for CLIP   Patient in agreement for wheel chair, walker and 3 in1. Orders for DME placed. Will call [employer] closer to discharge for delivery.   Expected Discharge Plan: [place] Barriers to Discharge: Continued Medical Work up  Expected Discharge Plan and Services Expected Discharge Plan: [place]   Discharge Planning Services: CM Consult Post Acute Care Choice: Home Health, Durable Medical Equipment Living arrangements for the past 2 months: Single Family Home                                       Social Determinants of Health (SDOH) Interventions    Readmission Risk Interventions No flowsheet data found.

## 2020-01-13 NOTE — Progress Notes (Addendum)
Renal Navigator appreciates call from TOC CM/H. Wile who states patient has just reported that she is discharging to an address in [place] with her daughter. This will change her outpatient HD clinic location-she has been accepted at [facility] in [place] per call to staff there today. Navigator will follow up with patient, who per CM, states she does not have the exact address yet.  Alphonzo Cruise, [place] Renal Navigator [phone]

## 2020-01-13 NOTE — Progress Notes (Signed)
Subjective: 1 Day Post-Op Procedure(s) (LRB): AMPUTATION BELOW KNEE (Left)  Patient reports pain as mild to moderate.  Tolerating POs well.  Admits to flatus.  Denies fever, chills, N/V, CP, SOB.  Resting comfortably in bed.  Objective:   VITALS:  Temp:  [97.2 F (36.2 C)-99.4 F (37.4 C)] 98.6 F (37 C) (11/12 0407) Pulse Rate:  [64-77] 70 (11/12 0407) Resp:  [13-19] 17 (11/12 0407) BP: (138-180)/(54-80) 138/55 (11/12 0407) SpO2:  [92 %-100 %] 95 % (11/12 0407)  General: WDWN patient in NAD. Psych:  Appropriate mood and affect. Neuro:  A&O x 3, Moving all extremities, sensation intact to light touch HEENT:  EOMs intact Chest:  Even non-labored respirations Skin:  Dressing C/D/I, no rashes or lesions Extremities: warm/dry, no visible edema, erythema or echymosis.  No lymphadenopathy. Pulses: Femoral 2+ MSK:  ROM: TKE, MMT: able to perform quad set    LABS Recent Labs    01/11/20 0139 01/12/20 0028  HGB 8.7* 8.8*  WBC 17.8* 22.5*  PLT 344 320   Recent Labs    01/11/20 0139 01/12/20 0028  NA 133* 135  K 3.9 3.9  CL 97* 100  CO2 24 25  BUN 94* 56*  CREATININE 5.43* 3.98*  GLUCOSE 269* 199*   No results for input(s): LABPT, INR in the last 72 hours.   Assessment/Plan: 1 Day Post-Op Procedure(s) (LRB): AMPUTATION BELOW KNEE (Left)  NWB L LE Stump protector/shrinker sock order.   Stump protector to be in place at all times once delivered. Reinforce dressing prn Up with therapy ABX per Medicine team/ID.   Plan for 2 week outpatient post-op visit with Dr. Doran Durand.  Mechele Claude PA-C [facility] Office:  [phone]

## 2020-01-13 NOTE — Progress Notes (Signed)
[facility] Progress Note  Subjective:  Underwent L BKA this am Seen on dialysis - 3rd treatment; tolerating fine.  Vitals:   01/13/20 0013 01/13/20 0407 01/13/20 0725 01/13/20 0746  BP: (!) 139/56 (!) 138/55 (!) 154/78 (!) 151/53  Pulse: 68 70    Resp: _0 Temp: 98.9 F (37.2 C) 98.6 F (37 C) 98.4 F (36.9 C)   TempSrc: Oral Oral Oral   SpO2: 94% 95%    Weight:   92.6 kg   Height:        Exam: Gen alert, no distress MMM No jvd or bruits Chest clear bilat to bases, no rales RRR no MRG Abd soft ntnd no mass or ascites +bs Ext trace ankle edema, L BKA w dsg Neuro is alert, Ox 3 , nf RIJ TDC c/d/i, L AC fossa incision c/d/i, L radial pulse strong and hand is warm; bruit present    Home meds:  - norvasc 5/ coreg 6.25/ asa / lipitor / hydralazine 50 tid  - insulin lantus 50u/ aspart 10u tid  - oxy IR prn/ cymbalta qd  - dexilant/ flexeril prn/ protonix 40     CXR - no acute disease     Last creat's outpatient >>      Aug 2021 - creat 4.9      Dec 23, 2019 - creat 5.2        Assessment/ Plan: 1. AoCKD 5 now ESRD- b/l creat 4.9- 5.2, eGFR 9- 11 from 8/21- 10/21. Pt f/b Dr Posey Pronto. Creat 6 on admission, now mid 7's, BUN ^^. IVF's given w/o improvement. S/p TDC and perm access 11/8, 1st HD 11/9, HD#2 11/10, #3 11/12. . No heparin.   CLIP to outpt HD underway. 2. Anemia ckd - Hb 7.6, was 6.3 on admit > got 2u prbc's, Hb in 8-9s now. Not symptomatic. Tsat low 10%, got 524m dose Feraheme on 11/06. Started ESA.  3. 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 Seen by ID, ancef dose adjusted. 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 HTN/ vol  - BP's are stable / normal on her 3 home meds 5. DM - on insulin, per pmd 6. L hip fracture - admission diagnosis. sp ORIF on 10/28 per ortho 7. BMM - started binder, phos at goal now. PTH 44.   [name] [facility] Pager [phone]   Recent Labs  Lab  01/11/20 0139 01/11/20 0139 01/12/20 0028 01/13/20 0808  K 3.9  --  3.9  --   BUN 94*  --  56*  --   CREATININE 5.43*  --  3.98*  --   CALCIUM 8.4*  8.3*  --  8.4*  --   PHOS 5.9*  --  4.3  --   HGB 8.7*   < > 8.8* 8.4*   < > = values in this interval not displayed.   Inpatient medications: . sodium chloride   Intravenous Once  . acetaminophen  650 mg Oral Q6H  . amLODipine  10 mg Oral Daily  . atorvastatin  40 mg Oral q1800  . carvedilol  25 mg Oral BID WC  . Chlorhexidine Gluconate Cloth  6 each Topical Q0600  . darbepoetin (ARANESP) injection - DIALYSIS  60 mcg Intravenous Q Tue-HD  . dexlansoprazole  60 mg Oral Daily  . docusate sodium  100 mg Oral BID  . DULoxetine  60 mg Oral Daily  . enoxaparin (  LOVENOX) injection  30 mg Subcutaneous Q24H  . feeding supplement (NEPRO CARB STEADY)  237 mL Oral BID BM  . hydrALAZINE  100 mg Oral TID  . insulin aspart  0-5 Units Subcutaneous QHS  . insulin aspart  0-9 Units Subcutaneous TID WC  . insulin aspart  8 Units Subcutaneous TID WC  . insulin glargine  38 Units Subcutaneous QHS  . multivitamin  1 tablet Oral QHS  . oxyCODONE      . senna  1 tablet Oral BID  . sevelamer carbonate  800 mg Oral TID WC   . sodium chloride Stopped (01/09/20 0759)  . sodium chloride 10 mL/hr at 01/10/20 1510  . [START ON 01/14/2020] sodium chloride    . [START ON 01/14/2020] sodium chloride    .  ceFAZolin (ANCEF) IV 1 g (01/12/20 2049)   sodium chloride, [START ON 01/14/2020] sodium chloride, [START ON 01/14/2020] sodium chloride, acetaminophen, [START ON 01/14/2020] alteplase, alum & mag hydroxide-simeth, calcium carbonate, cyclobenzaprine, diphenhydrAMINE, guaiFENesin-dextromethorphan, [START ON 01/14/2020] heparin, hydrALAZINE, HYDROmorphone (DILAUDID) injection, lip balm, loratadine, menthol-cetylpyridinium **OR** phenol, Muscle Rub, ondansetron **OR** ondansetron (ZOFRAN) IV, oxyCODONE, oxyCODONE, [START ON 01/14/2020]  pentafluoroprop-tetrafluoroeth, polyvinyl alcohol, sodium chloride

## 2020-01-14 DIAGNOSIS — D649 Anemia, unspecified: Secondary | ICD-10-CM | POA: Diagnosis not present

## 2020-01-14 DIAGNOSIS — M86672 Other chronic osteomyelitis, left ankle and foot: Secondary | ICD-10-CM | POA: Diagnosis not present

## 2020-01-14 DIAGNOSIS — N179 Acute kidney failure, unspecified: Secondary | ICD-10-CM | POA: Diagnosis not present

## 2020-01-14 DIAGNOSIS — G8918 Other acute postprocedural pain: Secondary | ICD-10-CM | POA: Diagnosis not present

## 2020-01-14 DIAGNOSIS — T148XXA Other injury of unspecified body region, initial encounter: Secondary | ICD-10-CM | POA: Diagnosis not present

## 2020-01-14 DIAGNOSIS — T879 Unspecified complications of amputation stump: Secondary | ICD-10-CM | POA: Diagnosis not present

## 2020-01-14 DIAGNOSIS — K5900 Constipation, unspecified: Secondary | ICD-10-CM | POA: Diagnosis not present

## 2020-01-14 DIAGNOSIS — I120 Hypertensive chronic kidney disease with stage 5 chronic kidney disease or end stage renal disease: Secondary | ICD-10-CM | POA: Diagnosis not present

## 2020-01-14 DIAGNOSIS — E1129 Type 2 diabetes mellitus with other diabetic kidney complication: Secondary | ICD-10-CM | POA: Diagnosis not present

## 2020-01-14 DIAGNOSIS — E8770 Fluid overload, unspecified: Secondary | ICD-10-CM | POA: Diagnosis not present

## 2020-01-14 DIAGNOSIS — L97524 Non-pressure chronic ulcer of other part of left foot with necrosis of bone: Secondary | ICD-10-CM | POA: Diagnosis not present

## 2020-01-14 DIAGNOSIS — E118 Type 2 diabetes mellitus with unspecified complications: Secondary | ICD-10-CM | POA: Diagnosis not present

## 2020-01-14 DIAGNOSIS — E871 Hypo-osmolality and hyponatremia: Secondary | ICD-10-CM | POA: Diagnosis not present

## 2020-01-14 DIAGNOSIS — N185 Chronic kidney disease, stage 5: Secondary | ICD-10-CM | POA: Diagnosis not present

## 2020-01-14 LAB — GLUCOSE, CAPILLARY
Glucose-Capillary: 126 mg/dL — ABNORMAL HIGH (ref 70–99)
Glucose-Capillary: 129 mg/dL — ABNORMAL HIGH (ref 70–99)
Glucose-Capillary: 179 mg/dL — ABNORMAL HIGH (ref 70–99)
Glucose-Capillary: 144 mg/dL — ABNORMAL HIGH (ref 70–99)

## 2020-01-14 NOTE — Progress Notes (Signed)
[name]  MRN: [id number] DOB/Age: 1965-09-17 54 y.o. [facility] Procedure: Procedure(s) (LRB): AMPUTATION BELOW KNEE (Left)     Subjective: Awake and alert, describes burning discomfort at amputation site  Vital Signs Temp:  [98.6 F (37 C)-99 F (37.2 C)] 99 F (37.2 C) (11/13 0449) Pulse Rate:  [67-70] 70 (11/13 0449) Resp:  [18] 18 (11/12 2044) BP: (140-146)/(51-57) 146/57 (11/13 0449) SpO2:  [97 %] 97 % (11/13 0449) Weight:  [93.5 kg] 93.5 kg (11/13 0449)  Lab Results Recent Labs    01/12/20 0028 01/13/20 0808  WBC 22.5* 20.5*  HGB 8.8* 8.4*  HCT 28.0* 27.7*  PLT 320 337   BMET Recent Labs    01/12/20 0028 01/13/20 0807  NA 135 132*  K 3.9 4.0  CL 100 97*  CO2 25 24  GLUCOSE 199* 198*  BUN 56* 62*  CREATININE 3.98* 4.80*  CALCIUM 8.4* 8.0*   INR  Date Value Ref Range Status  12/28/2019 1.1 0.8 - 1.2 Final    Comment:    (NOTE) INR goal varies based on device and disease states. Performed at [facility], [place] [address], [place], [place] [address]      Exam Left post op dressing clean and dry        Plan Stump protector/shronker still on order and not delivered yet  Continue current tx per medicine team  Jenetta Loges PA-C  01/14/2020, 11:06 AM Contact # [phone]

## 2020-01-14 NOTE — Progress Notes (Signed)
[facility] Progress Note  Subjective:  tol HD #3 fine yesterday In room this AM - some expected post op pain, no new issues  Vitals:   01/13/20 1036 01/13/20 1102 01/13/20 2044 01/14/20 0449  BP: (!) 159/56 (!) 164/59 (!) 140/51 (!) 146/57  Pulse:  74 67 70  Resp: _0 Temp: 99.8 F (37.7 C) 98.6 F (37 C) 98.6 F (37 C) 99 F (37.2 C)  TempSrc: Oral Oral Oral Oral  SpO2:  96% 97% 97%  Weight:    93.5 kg  Height:        Exam: Gen alert, no distress MMM No jvd or bruits Chest clear bilat to bases, no rales RRR no MRG Abd soft ntnd no mass or ascites +bs Ext trace ankle edema, L BKA w dsg Neuro is alert, Ox 3 , nf RIJ TDC c/d/i, L AC fossa incision c/d/i, L radial pulse strong and hand is warm; bruit present    Home meds:  - norvasc 5/ coreg 6.25/ asa / lipitor / hydralazine 50 tid  - insulin lantus 50u/ aspart 10u tid  - oxy IR prn/ cymbalta qd  - dexilant/ flexeril prn/ protonix 40     CXR - no acute disease     Last creat's outpatient >>      Aug 2021 - creat 4.9      Dec 23, 2019 - creat 5.2        Assessment/ Plan: 1. AoCKD 5 now ESRD- b/l creat 4.9- 5.2, eGFR 9- 11 from 8/21- 10/21. Pt f/b Dr Posey Pronto. Creat 6 on admission, now mid 7's, BUN ^^. IVF's given w/o improvement. S/p TDC and perm access 11/8, 1st HD 11/9, HD#2 11/10, #3 11/12. . No heparin.   CLIP to outpt HD underway --> she confirms she is moving to [place] "at least for a while" after d/c.  Plans had been [facility] but may need different plan --> however before changing plans need to clarify she won't need SNF prior to going 'home'.  Renal SW will f/u Mon. 2. Anemia ckd - Hb 7.6, was 6.3 on admit > got 2u prbc's, Hb in 8-9s now. Not symptomatic. Tsat low 10%, got 536m dose Feraheme on 11/06. Started ESA.  3. 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 Seen by ID, ancef dose adjusted. 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 HTN/ vol  - BP's are stable / normal  on her 3 home meds 5. DM - on insulin, per pmd 6. L hip fracture - admission diagnosis. sp ORIF on 10/28 per ortho 7. BMM - started binder, phos at goal now. PTH 44.   [name] [facility] Pager [phone]   Recent Labs  Lab 01/12/20 0028 01/13/20 0807 01/13/20 0808  K 3.9 4.0  --   BUN 56* 62*  --   CREATININE 3.98* 4.80*  --   CALCIUM 8.4* 8.0*  --   PHOS 4.3 4.8*  --   HGB 8.8*  --  8.4*   Inpatient medications: . sodium chloride   Intravenous Once  . acetaminophen  650 mg Oral Q6H  . amLODipine  10 mg Oral Daily  . atorvastatin  40 mg Oral q1800  . bisacodyl  10 mg Rectal Once  . carvedilol  25 mg Oral BID WC  . Chlorhexidine Gluconate Cloth  6 each Topical Q0600  . darbepoetin (ARANESP) injection - DIALYSIS  60 mcg  Intravenous Q Tue-HD  . dexlansoprazole  60 mg Oral Daily  . docusate sodium  100 mg Oral BID  . DULoxetine  60 mg Oral Daily  . enoxaparin (LOVENOX) injection  30 mg Subcutaneous Q24H  . feeding supplement (NEPRO CARB STEADY)  237 mL Oral BID BM  . hydrALAZINE  100 mg Oral TID  . insulin aspart  0-5 Units Subcutaneous QHS  . insulin aspart  0-9 Units Subcutaneous TID WC  . insulin aspart  8 Units Subcutaneous TID WC  . insulin glargine  38 Units Subcutaneous QHS  . multivitamin  1 tablet Oral QHS  . polyethylene glycol  17 g Oral Daily  . senna  1 tablet Oral BID  . sevelamer carbonate  800 mg Oral TID WC   . sodium chloride Stopped (01/09/20 0759)  . sodium chloride 10 mL/hr at 01/10/20 1510  .  ceFAZolin (ANCEF) IV 1 g (01/13/20 2049)   sodium chloride, acetaminophen, alum & mag hydroxide-simeth, calcium carbonate, cyclobenzaprine, diphenhydrAMINE, guaiFENesin-dextromethorphan, hydrALAZINE, HYDROmorphone (DILAUDID) injection, lip balm, loratadine, menthol-cetylpyridinium **OR** phenol, Muscle Rub, ondansetron **OR** ondansetron (ZOFRAN) IV, oxyCODONE, oxyCODONE, polyvinyl alcohol, sodium chloride

## 2020-01-14 NOTE — Plan of Care (Signed)
  Problem: Activity: Goal: Risk for activity intolerance will decrease Outcome: Progressing   Problem: Coping: Goal: Level of anxiety will decrease Outcome: Progressing   Problem: Pain Managment: Goal: General experience of comfort will improve Outcome: Progressing   

## 2020-01-14 NOTE — Progress Notes (Signed)
PROGRESS NOTE    JOLEEN STUCKERT  ZOX:[id number] DOB: 06/15/1965 DOA: 12/28/2019 PCP: Julian Hy, PA-C   Brief Narrative:  HPI On 12/29/2019 by Dr. 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 Has had some difficulty with ambulating due to her left foot amputation.  Did not hit her head, no LOC, no other complaints.   Interim history  Initially admitted for left hip fracture and underwent surgery on 12/29/2019.  Hospitalization complicated by worsening leukocytosis of up to 35,000.  Patient was noted to have drainage from her left foot amputation site and was started on Ancef on 01/04/2020 for infection.  ID was consulted on 01/05/2020.  MRI on 01/08/2020 revealed an abscess and osteomyelitis.  Patient underwent tunneled dialysis catheter on 01/09/2020.  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  Patient was also started on hemodialysis and currently within the Clip process. Assessment & Plan   Left intertrochanteric fracture -Status post fall -Orthopedic surgery consulted and appreciated status post IM nail on 12/29/2019 -PT consulted recommended CIR versus home with home health and 24-hour care, rolling walker, 3 in 1, wheelchair -Continue pain control  Acute kidney injury on chronic kidney disease, stage V with metabolic acidosis, progressed to ESRD -Patient noted to have volume overload -Failed Lasix trial -Was treated on bicarb infusion -Nephrology consulted and appreciated -Status post tunnel catheter on 01/09/2020 with dialysis starting on 01/10/2020 -Currently pending clip  Left foot abscess and osteomyelitis -Status post show part  amputation -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 by orthopedic surgery -Initially placed on Ancef however given that patient underwent amputation, will discontinue today -Continue pain control -Stump protector in place  Constipation/IBS with diarrhea -Likely compounded by narcotics and being bedbound -Lotronex held to prevent further constipation -Continue Dulcolax suppository and bowel regimen with MiraLAX, Colace and senna  Mild hyponatremia/hypochloremia -Managed with hemodialysis -pending labs today  Leukocytosis -Likely reactive  -Continue to monitor CBC -Currently patient afebrile  Diabetes mellitus, type II -Patient uses Lantus and NovoLog at home -Hemoglobin A1c was 6.2 on 12/29/2019 -Continue Lantus, insulin sliding scale, along with 8 units with meals, and CBG monitoring  GERD -Continue PPI  Essential hypertension -Continue Coreg, hydralazine, amlodipine  Acute on chronic anemia/anemia of chronic disease -Suspect multifactorial including acute infection, hip fracture and renal disease -Hemoglobin did drop several times and patient thus far has received 4 units of PRBC as well as Feraheme -Hemoglobin 8.4 -Continue to monitor CBC   DVT Prophylaxis Lovenox  Code Status: Full  Family Communication: None at bedside  Disposition Plan:  Status is: Inpatient  Remains inpatient appropriate because:Pending clip process and discharge planning, CIR versus home with home health   Dispo: The patient is from: Home              Anticipated d/c is to: TBD              Anticipated d/c date is: > 3 days              Patient currently is not medically stable to d/c.   Consultants Infectious disease Orthopedic surgery Vascular surgery Nephrology  Procedures  Left IM nail 12/29/2019 Left BKA 01/12/2020 Tunneled catheter placement on 01/09/2020  Antibiotics   Anti-infectives (From admission, onward)   Start     Dose/Rate Route Frequency Ordered Stop   01/12/20  0806  vancomycin (VANCOCIN) powder  Status:  Discontinued          As needed 01/12/20 0806 01/12/20 0946   01/10/20 2000  ceFAZolin (ANCEF) IVPB 1 g/50 mL premix        1 g 100 mL/hr over 30 Minutes Intravenous Every 24 hours 01/10/20 1343     01/10/20 0600  ceFAZolin (ANCEF) IVPB 2g/100 mL premix        2 g 200 mL/hr over 30 Minutes Intravenous On call to O.R. 01/09/20 1952 01/11/20 0559   01/06/20 0400  ceFAZolin (ANCEF) IVPB 2g/100 mL premix  Status:  Discontinued        2 g 200 mL/hr over 30 Minutes Intravenous Every 12 hours 01/05/20 1709 01/10/20 1343   01/05/20 2200  ceFAZolin (ANCEF) IVPB 2g/100 mL premix  Status:  Discontinued        2 g 200 mL/hr over 30 Minutes Intravenous Every 8 hours 01/05/20 1707 01/05/20 1709   01/04/20 1700  ceFAZolin (ANCEF) IVPB 1 g/50 mL premix  Status:  Discontinued        1 g 100 mL/hr over 30 Minutes Intravenous Every 12 hours 01/04/20 1611 01/05/20 1707   12/29/19 1930  ceFAZolin (ANCEF) IVPB 2g/100 mL premix        2 g 200 mL/hr over 30 Minutes Intravenous Every 6 hours 12/29/19 1620 12/30/19 0201   12/29/19 1300  ceFAZolin (ANCEF) IVPB 2g/100 mL premix        2 g 200 mL/hr over 30 Minutes Intravenous On call to O.R. 12/29/19 0113 12/29/19 1325      Subjective:   Lorelei Heikkila seen and examined today.  Complains of pain in her left lower extremity.  Denies current chest pain or shortness of breath, abdominal pain, nausea or vomiting, dizziness or headache.  Objective:   Vitals:   01/13/20 1102 01/13/20 2044 01/14/20 0449 01/14/20 1142  BP: (!) 164/59 (!) 140/51 (!) 146/57 (!) 130/50  Pulse: 74 67 70 70  Resp: 18 18  16   Temp: 98.6 F (37 C) 98.6 F (37 C) 99 F (37.2 C) 99 F (37.2 C)  TempSrc: Oral Oral Oral   SpO2: 96% 97% 97% 97%  Weight:   93.5 kg   Height:        Intake/Output Summary (Last 24 hours) at 01/14/2020 1231 Last data filed at 01/14/2020 0230 Gross per 24 hour  Intake 100 ml  Output 325 ml  Net -225 ml    Filed Weights   01/13/20 0725 01/13/20 1036 01/14/20 0449  Weight: 92.6 kg 91.1 kg 93.5 kg    Exam  General: Well developed, well nourished, NAD, appears stated age  92: NCAT, mucous membranes moist.   Cardiovascular: S1 S2 auscultated, soft SEM, RRR  Respiratory: Clear to auscultation bilaterally   Abdomen: Soft, nontender, nondistended, + bowel sounds  Extremities: warm dry without cyanosis clubbing, trace edema of RLE. L BKA w/ dressing in place  Neuro: AAOx3, nonfocal  Psych:Appropriate mood and affect   Data Reviewed: I have personally reviewed following labs and imaging studies  CBC: Recent Labs  Lab 01/09/20 0111 01/10/20 0405 01/11/20 0139 01/12/20 0028 01/13/20 0808  WBC 18.0* 19.5* 17.8* 22.5* 20.5*  HGB 8.2* 8.9* 8.7* 8.8* 8.4*  HCT 25.8* 27.5* 27.6* 28.0* 27.7*  MCV 85.7 86.5 87.3 87.8 91.1  PLT 261 328 344  320 379   Basic Metabolic Panel: Recent Labs  Lab 01/09/20 0111 01/09/20 0111 01/10/20 0405 01/11/20 0139 01/12/20 0028 01/13/20 0807  NA 127*  --  126* 133* 135 132*  K 4.2  --  4.8 3.9 3.9 4.0  CL 92*  --  92* 97* 100 97*  CO2 21*  --  20* 24 25 24   GLUCOSE 216*  --  320* 269* 199* 198*  BUN 148*  --  154* 94* 56* 62*  CREATININE 7.67*  --  7.90* 5.43* 3.98* 4.80*  CALCIUM 8.3*   < > 8.6* 8.4*  8.3* 8.4* 8.0*  MG  --   --   --  1.8  --   --   PHOS 6.7*  --  7.8* 5.9* 4.3 4.8*   < > = values in this interval not displayed.   GFR: Estimated Creatinine Clearance: 17 mL/min (A) (by C-G formula based on SCr of 4.8 mg/dL (H)). Liver Function Tests: Recent Labs  Lab 01/09/20 0111 01/10/20 0405 01/11/20 0139 01/12/20 0028 01/13/20 0807  ALBUMIN 1.7* 1.7* 1.8* 1.8* 1.7*   No results for input(s): LIPASE, AMYLASE in the last 168 hours. No results for input(s): AMMONIA in the last 168 hours. Coagulation Profile: No results for input(s): INR, PROTIME in the last 168 hours. Cardiac Enzymes: No results for input(s): CKTOTAL,  CKMB, CKMBINDEX, TROPONINI in the last 168 hours. BNP (last 3 results) No results for input(s): PROBNP in the last 8760 hours. HbA1C: No results for input(s): HGBA1C in the last 72 hours. CBG: Recent Labs  Lab 01/13/20 0645 01/13/20 1106 01/13/20 1652 01/13/20 2042 01/14/20 0751  GLUCAP 181* 137* 236* 243* 126*   Lipid Profile: No results for input(s): CHOL, HDL, LDLCALC, TRIG, CHOLHDL, LDLDIRECT in the last 72 hours. Thyroid Function Tests: No results for input(s): TSH, T4TOTAL, FREET4, T3FREE, THYROIDAB in the last 72 hours. Anemia Panel: No results for input(s): VITAMINB12, FOLATE, FERRITIN, TIBC, IRON, RETICCTPCT in the last 72 hours. Urine analysis:    Component Value Date/Time   COLORURINE YELLOW 03/29/2014 1259   APPEARANCEUR CLEAR 03/29/2014 1259   LABSPEC 1.020 03/29/2014 1259   PHURINE 7.5 03/29/2014 1259   GLUCOSEU NEGATIVE 03/29/2014 1259   HGBUR TRACE-INTACT (A) 03/29/2014 1259   BILIRUBINUR neg 02/16/2017 1501   KETONESUR NEGATIVE 03/29/2014 1259   PROTEINUR Positive (A) 10/13/2017 1632   UROBILINOGEN 0.2 02/16/2017 1501   UROBILINOGEN 0.2 03/29/2014 1259   NITRITE neg 02/16/2017 1501   NITRITE NEGATIVE 03/29/2014 1259   LEUKOCYTESUR Negative 02/16/2017 1501   Sepsis Labs: @LABRCNTIP (procalcitonin:4,lacticidven:4)  ) Recent Results (from the past 240 hour(s))  Culture, blood (routine x 2)     Status: None   Collection Time: 01/04/20  5:43 PM   Specimen: BLOOD LEFT HAND  Result Value Ref Range Status   Specimen Description   Final    BLOOD LEFT HAND Performed at [facility] [address] [place], [place] [address]    Special Requests   Final    BOTTLES DRAWN AEROBIC AND ANAEROBIC Blood Culture adequate volume Performed at [facility] [address], [place], [place] [address]    Culture   Final    NO GROWTH 5 DAYS Performed at [facility], [place] [address], [place], [place] [address]    Report  Status 01/09/2020 FINAL  Final  Culture, blood (routine x 2)     Status: None   Collection Time: 01/04/20  5:51 PM   Specimen: BLOOD  Result Value Ref Range Status   Specimen Description   Final    BLOOD RIGHT ANTECUBITAL Performed at [facility] [address] [place], [place] [address]    Special Requests   Final    BOTTLES DRAWN AEROBIC ONLY Blood Culture results may not be optimal due to an inadequate volume of blood received in culture bottles Performed at [facility] [address] [place], [place] [address]    Culture   Final    NO GROWTH 5 DAYS Performed at [facility], [place] [address] [place], [place] [address]    Report Status 01/09/2020 FINAL  Final  Surgical pcr screen     Status: None   Collection Time: 01/09/20  7:57 PM   Specimen: Nasal Mucosa; Nasal Swab  Result Value Ref Range Status   MRSA, PCR NEGATIVE NEGATIVE Final   Staphylococcus aureus NEGATIVE NEGATIVE Final    Comment: (NOTE) The Xpert SA Assay (FDA approved for NASAL specimens in patients 37 years of age and older), is one component of a comprehensive surveillance program. It is not intended to diagnose infection nor to guide or monitor treatment. Performed at [facility], [place] [address], [place], [place] [address]       Radiology Studies: No results found.   Scheduled Meds: . sodium chloride   Intravenous Once  . acetaminophen  650 mg Oral Q6H  . amLODipine  10 mg Oral Daily  . atorvastatin  40 mg Oral q1800  . bisacodyl  10 mg Rectal Once  . carvedilol  25 mg Oral BID WC  . Chlorhexidine Gluconate Cloth  6 each Topical Q0600  . darbepoetin (ARANESP) injection - DIALYSIS  60 mcg Intravenous Q Tue-HD  . dexlansoprazole  60 mg Oral Daily  . docusate sodium  100 mg Oral BID  . DULoxetine  60 mg Oral Daily  . enoxaparin (LOVENOX) injection  30 mg Subcutaneous Q24H  . feeding supplement (NEPRO CARB STEADY)  237 mL Oral BID BM  .  hydrALAZINE  100 mg Oral TID  . insulin aspart  0-5 Units Subcutaneous QHS  . insulin aspart  0-9 Units Subcutaneous TID WC  . insulin aspart  8 Units Subcutaneous TID WC  . insulin glargine  38 Units Subcutaneous QHS  . multivitamin  1 tablet Oral QHS  . polyethylene glycol  17 g Oral Daily  . senna  1 tablet Oral BID  . sevelamer carbonate  800 mg Oral TID WC   Continuous Infusions: . sodium chloride Stopped (01/09/20 0759)  . sodium chloride 10 mL/hr at 01/10/20 1510  .  ceFAZolin (ANCEF) IV 1 g (01/13/20 2049)     LOS: 17 days   Time Spent in minutes   45 minutes  Divit Stipp D.O. on 01/14/2020 at 12:31 PM  Between 7am to 7pm - Please see pager noted on [email or web address]  After 7pm go to [email or web address]  And look for the night coverage person covering for me after hours  [facility] Office  [phone]

## 2020-01-15 DIAGNOSIS — L97524 Non-pressure chronic ulcer of other part of left foot with necrosis of bone: Secondary | ICD-10-CM | POA: Diagnosis not present

## 2020-01-15 DIAGNOSIS — D649 Anemia, unspecified: Secondary | ICD-10-CM | POA: Diagnosis not present

## 2020-01-15 DIAGNOSIS — M86672 Other chronic osteomyelitis, left ankle and foot: Secondary | ICD-10-CM | POA: Diagnosis not present

## 2020-01-15 DIAGNOSIS — E8770 Fluid overload, unspecified: Secondary | ICD-10-CM | POA: Diagnosis not present

## 2020-01-15 DIAGNOSIS — E118 Type 2 diabetes mellitus with unspecified complications: Secondary | ICD-10-CM | POA: Diagnosis not present

## 2020-01-15 DIAGNOSIS — N179 Acute kidney failure, unspecified: Secondary | ICD-10-CM | POA: Diagnosis not present

## 2020-01-15 DIAGNOSIS — E1129 Type 2 diabetes mellitus with other diabetic kidney complication: Secondary | ICD-10-CM | POA: Diagnosis not present

## 2020-01-15 DIAGNOSIS — T879 Unspecified complications of amputation stump: Secondary | ICD-10-CM | POA: Diagnosis not present

## 2020-01-15 DIAGNOSIS — T148XXA Other injury of unspecified body region, initial encounter: Secondary | ICD-10-CM | POA: Diagnosis not present

## 2020-01-15 DIAGNOSIS — I120 Hypertensive chronic kidney disease with stage 5 chronic kidney disease or end stage renal disease: Secondary | ICD-10-CM | POA: Diagnosis not present

## 2020-01-15 DIAGNOSIS — K5900 Constipation, unspecified: Secondary | ICD-10-CM | POA: Diagnosis not present

## 2020-01-15 DIAGNOSIS — G8918 Other acute postprocedural pain: Secondary | ICD-10-CM | POA: Diagnosis not present

## 2020-01-15 DIAGNOSIS — E871 Hypo-osmolality and hyponatremia: Secondary | ICD-10-CM | POA: Diagnosis not present

## 2020-01-15 DIAGNOSIS — N185 Chronic kidney disease, stage 5: Secondary | ICD-10-CM | POA: Diagnosis not present

## 2020-01-15 LAB — BASIC METABOLIC PANEL
Anion gap: 9 (ref 5–15)
BUN: 42 mg/dL — ABNORMAL HIGH (ref 6–20)
CO2: 23 mmol/L (ref 22–32)
Calcium: 8.4 mg/dL — ABNORMAL LOW (ref 8.9–10.3)
Chloride: 101 mmol/L (ref 98–111)
Creatinine, Ser: 4.64 mg/dL — ABNORMAL HIGH (ref 0.44–1.00)
GFR, Estimated: 11 mL/min — ABNORMAL LOW (ref >60)
Glucose, Bld: 168 mg/dL — ABNORMAL HIGH (ref 70–99)
Potassium: 4.1 mmol/L (ref 3.5–5.1)
Sodium: 133 mmol/L — ABNORMAL LOW (ref 135–145)

## 2020-01-15 LAB — GLUCOSE, CAPILLARY
Glucose-Capillary: 146 mg/dL — ABNORMAL HIGH (ref 70–99)
Glucose-Capillary: 174 mg/dL — ABNORMAL HIGH (ref 70–99)
Glucose-Capillary: 199 mg/dL — ABNORMAL HIGH (ref 70–99)
Glucose-Capillary: 218 mg/dL — ABNORMAL HIGH (ref 70–99)

## 2020-01-15 NOTE — Progress Notes (Signed)
[facility] Progress Note  Subjective:  In room this AM - some expected post op pain, no new issues dispo plans remain unclear  Vitals:   01/14/20 0449 01/14/20 1142 01/14/20 2120 01/15/20 0536  BP: (!) 146/57 (!) 130/50 (!) 153/49 (!) 143/79  Pulse: 70 70 68 68  Resp:  '16 17 16  ' Temp: 99 F (37.2 C) 99 F (37.2 C) 98.9 F (37.2 C) 98.4 F (36.9 C)  TempSrc: Oral  Oral Oral  SpO2: 97% 97% 96% 97%  Weight: 93.5 kg     Height:        Exam: Gen alert, no distress MMM No jvd or bruits Chest clear bilat to bases, no rales RRR no MRG Abd soft ntnd no mass or ascites +bs Ext trace ankle edema, L BKA w dsg Neuro is alert, Ox 3 , nf RIJ TDC c/d/i, L AC fossa incision c/d/i, L radial pulse strong and hand is warm; bruit present    Home meds:  - norvasc 5/ coreg 6.25/ asa / lipitor / hydralazine 50 tid  - insulin lantus 50u/ aspart 10u tid  - oxy IR prn/ cymbalta qd  - dexilant/ flexeril prn/ protonix 40     CXR - no acute disease     Last creat's outpatient >>      Aug 2021 - creat 4.9      Dec 23, 2019 - creat 5.2        Assessment/ Plan: 1. AoCKD 5 now ESRD- b/l creat 4.9- 5.2, eGFR 9- 11 from 8/21- 10/21. Pt f/b Dr Posey Pronto. Creat 6 on admission, now mid 7's, BUN ^^. IVF's given w/o improvement. S/p TDC and perm access 11/8, 1st HD 11/9, HD#2 11/10, #3 11/12. . No heparin.   CLIP to outpt HD underway --> she confirms she is moving to [place] "at least for a while" after d/c.  Plans had been [facility] but may need different plan --> however before changing plans need to clarify she won't need SNF prior to going 'home'.  Renal SW will f/u Mon. 2. Anemia ckd - Hb 7.6, was 6.3 on admit > got 2u prbc's, Hb in 8-9s now. Not symptomatic. Tsat low 10%, got 554m dose Feraheme on 11/06. Started ESA.  3. 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 Seen by ID, ancef dose adjusted. 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 HTN/ vol  - BP's are stable /  normal on her 3 home meds 5. DM - on insulin, per pmd 6. L hip fracture - admission diagnosis. sp ORIF on 10/28 per ortho 7. BMM - started binder, phos at goal now. PTH 44.   [name] [facility] Pager [phone]   Recent Labs  Lab 01/12/20 0028 01/12/20 0028 01/13/20 0807 01/13/20 0808 01/15/20 0300  K 3.9   < > 4.0  --  4.1  BUN 56*   < > 62*  --  42*  CREATININE 3.98*   < > 4.80*  --  4.64*  CALCIUM 8.4*   < > 8.0*  --  8.4*  PHOS 4.3  --  4.8*  --   --   HGB 8.8*  --   --  8.4*  --    < > = values in this interval not displayed.   Inpatient medications: . sodium chloride   Intravenous Once  . acetaminophen  650 mg Oral Q6H  . amLODipine  10 mg Oral  Daily  . atorvastatin  40 mg Oral q1800  . bisacodyl  10 mg Rectal Once  . carvedilol  25 mg Oral BID WC  . Chlorhexidine Gluconate Cloth  6 each Topical Q0600  . darbepoetin (ARANESP) injection - DIALYSIS  60 mcg Intravenous Q Tue-HD  . dexlansoprazole  60 mg Oral Daily  . docusate sodium  100 mg Oral BID  . DULoxetine  60 mg Oral Daily  . enoxaparin (LOVENOX) injection  30 mg Subcutaneous Q24H  . feeding supplement (NEPRO CARB STEADY)  237 mL Oral BID BM  . hydrALAZINE  100 mg Oral TID  . insulin aspart  0-5 Units Subcutaneous QHS  . insulin aspart  0-9 Units Subcutaneous TID WC  . insulin aspart  8 Units Subcutaneous TID WC  . insulin glargine  38 Units Subcutaneous QHS  . multivitamin  1 tablet Oral QHS  . polyethylene glycol  17 g Oral Daily  . senna  1 tablet Oral BID  . sevelamer carbonate  800 mg Oral TID WC   . sodium chloride Stopped (01/09/20 0759)  . sodium chloride 10 mL/hr at 01/10/20 1510   sodium chloride, acetaminophen, alum & mag hydroxide-simeth, calcium carbonate, cyclobenzaprine, diphenhydrAMINE, guaiFENesin-dextromethorphan, hydrALAZINE, HYDROmorphone (DILAUDID) injection, lip balm, loratadine, menthol-cetylpyridinium **OR** phenol, Muscle Rub, ondansetron **OR**  ondansetron (ZOFRAN) IV, oxyCODONE, oxyCODONE, polyvinyl alcohol, sodium chloride

## 2020-01-15 NOTE — Progress Notes (Signed)
PT Cancellation Note  Patient Details Name: NOVELLE ADDAIR MRN: [id number] DOB: [birth year]   Cancelled Treatment:    Reason Eval/Treat Not Completed: Other (comment)   Declining mobility at this time due to pain; Spoke with Burna Mortimer, RN, who will give pain meds;   Discussed with pt returning approx 30 minutes after pain meds for PT/mobility, and she told us she is unsure that she can participate today;   Will reattempt as time allows;   Roney Marion, [place]  Acute Rehabilitation Services Pager [phone] Office [place] 01/15/2020, 11:18 AM

## 2020-01-15 NOTE — Progress Notes (Signed)
PROGRESS NOTE    [name]  OZH:[id number] DOB: 1965-09-17 DOA: 12/28/2019 PCP: Julian Hy, PA-C   Brief Narrative:  HPI On 12/29/2019 by Dr. 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 Has had some difficulty with ambulating due to her left foot amputation.  Did not hit her head, no LOC, no other complaints.   Interim history  Initially admitted for left hip fracture and underwent surgery on 12/29/2019.  Hospitalization complicated by worsening leukocytosis of up to 35,000.  Patient was noted to have drainage from her left foot amputation site and was started on Ancef on 01/04/2020 for infection.  ID was consulted on 01/05/2020.  MRI on 01/08/2020 revealed an abscess and osteomyelitis.  Patient underwent tunneled dialysis catheter on 01/09/2020.  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  Patient was also started on hemodialysis and currently within the Clip process. Assessment & Plan   Left intertrochanteric fracture -Status post fall -Orthopedic surgery consulted and appreciated status post IM nail on 12/29/2019 -PT consulted recommended CIR versus home with home health and 24-hour care, rolling walker, 3 in 1, wheelchair -Continue pain control  Acute kidney injury on chronic kidney disease, stage V with metabolic acidosis, progressed to ESRD -Patient noted to have volume overload -Failed Lasix trial -Was treated on bicarb infusion -Nephrology consulted and appreciated -Status post tunnel catheter on 01/09/2020 with dialysis starting on 01/10/2020 -Currently pending clip  Left foot abscess and osteomyelitis -Status post show part  amputation -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 by orthopedic surgery -Initially placed on Ancef however given that patient underwent amputation, will discontinue today -Continue pain control -Stump protector in place  Constipation/IBS with diarrhea -Likely compounded by narcotics and being bedbound -Lotronex held to prevent further constipation -Continue Dulcolax suppository and bowel regimen with MiraLAX, Colace and senna  Mild hyponatremia/hypochloremia -Managed with hemodialysis -improving, continue to monitor   Leukocytosis -Likely reactive  -Continue to monitor CBC -Currently patient afebrile  Diabetes mellitus, type II -Patient uses Lantus and NovoLog at home -Hemoglobin A1c was 6.2 on 12/29/2019 -Continue Lantus, insulin sliding scale, along with 8 units with meals, and CBG monitoring -CBGs appear to be controlled  GERD -Continue PPI  Essential hypertension -Continue Coreg, hydralazine, amlodipine -BP stable  Acute on chronic anemia/anemia of chronic disease -Suspect multifactorial including acute infection, hip fracture and renal disease -Hemoglobin did drop several times and patient thus far has received 4 units of PRBC as well as Feraheme -Hemoglobin 8.4 -Continue to monitor CBC   DVT Prophylaxis Lovenox  Code Status: Full  Family Communication: None at bedside  Disposition Plan:  Status is: Inpatient  Remains inpatient appropriate because:Pending clip process and discharge planning, CIR versus home with home health   Dispo: The patient is from: Home              Anticipated d/c is to: TBD              Anticipated d/c date is: > 3 days              Patient currently is not medically stable to d/c.   Consultants Infectious disease Orthopedic surgery Vascular surgery Nephrology  Procedures  Left IM nail  12/29/2019 Left BKA 01/12/2020 Tunneled catheter placement on 01/09/2020  Antibiotics   Anti-infectives (From admission, onward)   Start      Dose/Rate Route Frequency Ordered Stop   01/12/20 0806  vancomycin (VANCOCIN) powder  Status:  Discontinued          As needed 01/12/20 0806 01/12/20 0946   01/10/20 2000  ceFAZolin (ANCEF) IVPB 1 g/50 mL premix  Status:  Discontinued        1 g 100 mL/hr over 30 Minutes Intravenous Every 24 hours 01/10/20 1343 01/14/20 1311   01/10/20 0600  ceFAZolin (ANCEF) IVPB 2g/100 mL premix        2 g 200 mL/hr over 30 Minutes Intravenous On call to O.R. 01/09/20 1952 01/11/20 0559   01/06/20 0400  ceFAZolin (ANCEF) IVPB 2g/100 mL premix  Status:  Discontinued        2 g 200 mL/hr over 30 Minutes Intravenous Every 12 hours 01/05/20 1709 01/10/20 1343   01/05/20 2200  ceFAZolin (ANCEF) IVPB 2g/100 mL premix  Status:  Discontinued        2 g 200 mL/hr over 30 Minutes Intravenous Every 8 hours 01/05/20 1707 01/05/20 1709   01/04/20 1700  ceFAZolin (ANCEF) IVPB 1 g/50 mL premix  Status:  Discontinued        1 g 100 mL/hr over 30 Minutes Intravenous Every 12 hours 01/04/20 1611 01/05/20 1707   12/29/19 1930  ceFAZolin (ANCEF) IVPB 2g/100 mL premix        2 g 200 mL/hr over 30 Minutes Intravenous Every 6 hours 12/29/19 1620 12/30/19 0201   12/29/19 1300  ceFAZolin (ANCEF) IVPB 2g/100 mL premix        2 g 200 mL/hr over 30 Minutes Intravenous On call to O.R. 12/29/19 0113 12/29/19 1325      Subjective:   [name] seen and examined today. Continues to complain of left lower extremity pain and states her pain is approximately an 8-9 out of 10. Denies current chest pain or shortness of breath, abdominal pain, nausea or vomiting, dizziness or headache. Complains of bouts of constipation however does not wish to have an enema or suppository.   Objective:   Vitals:   01/14/20 1142 01/14/20 2120 01/15/20 0536 01/15/20 1003  BP: (!) 130/50 (!) 153/49 (!) 143/79 (!) 142/50  Pulse: 70 68 68 71  Resp: 16 17 16    Temp: 99 F (37.2 C) 98.9 F (37.2 C) 98.4 F (36.9 C)   TempSrc:  Oral Oral    SpO2: 97% 96% 97% 96%  Weight:      Height:        Intake/Output Summary (Last 24 hours) at 01/15/2020 1203 Last data filed at 01/15/2020 0536 Gross per 24 hour  Intake 240 ml  Output 700 ml  Net -460 ml   Filed Weights   01/13/20 0725 01/13/20 1036 01/14/20 0449  Weight: 92.6 kg 91.1 kg 93.5 kg   Exam  General: Well developed, well nourished, NAD, appears stated age  45: NCAT, mucous membranes moist.   Cardiovascular: S1 S2 auscultated, RRR, soft SEM  Respiratory: Clear to auscultation bilaterally, anteriorly  Abdomen: Soft, nontender, nondistended, + bowel sounds  Extremities: warm dry without cyanosis clubbing or edema of RLE. L BKA with dressing in place.  Neuro: AAOx3, nonfocal  Psych: appropriate mood and affect, pleasant   Data Reviewed: I have personally reviewed following labs and imaging studies  CBC: Recent Labs  Lab 01/09/20 0111 01/10/20 0405 01/11/20 0139 01/12/20  0028 01/13/20 0808  WBC 18.0* 19.5* 17.8* 22.5* 20.5*  HGB 8.2* 8.9* 8.7* 8.8* 8.4*  HCT 25.8* 27.5* 27.6* 28.0* 27.7*  MCV 85.7 86.5 87.3 87.8 91.1  PLT 261 328 344 320 532   Basic Metabolic Panel: Recent Labs  Lab 01/09/20 0111 01/09/20 0111 01/10/20 0405 01/11/20 0139 01/12/20 0028 01/13/20 0807 01/15/20 0300  NA 127*   < > 126* 133* 135 132* 133*  K 4.2   < > 4.8 3.9 3.9 4.0 4.1  CL 92*   < > 92* 97* 100 97* 101  CO2 21*   < > 20* 24 25 24 23   GLUCOSE 216*   < > 320* 269* 199* 198* 168*  BUN 148*   < > 154* 94* 56* 62* 42*  CREATININE 7.67*   < > 7.90* 5.43* 3.98* 4.80* 4.64*  CALCIUM 8.3*   < > 8.6* 8.4*  8.3* 8.4* 8.0* 8.4*  MG  --   --   --  1.8  --   --   --   PHOS 6.7*  --  7.8* 5.9* 4.3 4.8*  --    < > = values in this interval not displayed.   GFR: Estimated Creatinine Clearance: 17.6 mL/min (A) (by C-G formula based on SCr of 4.64 mg/dL (H)). Liver Function Tests: Recent Labs  Lab 01/09/20 0111 01/10/20 0405 01/11/20 0139 01/12/20 0028  01/13/20 0807  ALBUMIN 1.7* 1.7* 1.8* 1.8* 1.7*   No results for input(s): LIPASE, AMYLASE in the last 168 hours. No results for input(s): AMMONIA in the last 168 hours. Coagulation Profile: No results for input(s): INR, PROTIME in the last 168 hours. Cardiac Enzymes: No results for input(s): CKTOTAL, CKMB, CKMBINDEX, TROPONINI in the last 168 hours. BNP (last 3 results) No results for input(s): PROBNP in the last 8760 hours. HbA1C: No results for input(s): HGBA1C in the last 72 hours. CBG: Recent Labs  Lab 01/14/20 1233 01/14/20 1709 01/14/20 2111 01/15/20 0807 01/15/20 1121  GLUCAP 129* 179* 144* 146* 174*   Lipid Profile: No results for input(s): CHOL, HDL, LDLCALC, TRIG, CHOLHDL, LDLDIRECT in the last 72 hours. Thyroid Function Tests: No results for input(s): TSH, T4TOTAL, FREET4, T3FREE, THYROIDAB in the last 72 hours. Anemia Panel: No results for input(s): VITAMINB12, FOLATE, FERRITIN, TIBC, IRON, RETICCTPCT in the last 72 hours. Urine analysis:    Component Value Date/Time   COLORURINE YELLOW 03/29/2014 [place] 03/29/2014 1259   LABSPEC 1.020 03/29/2014 1259   PHURINE 7.5 03/29/2014 1259   GLUCOSEU NEGATIVE 03/29/2014 1259   HGBUR TRACE-INTACT (A) 03/29/2014 1259   BILIRUBINUR neg 02/16/2017 1501   KETONESUR NEGATIVE 03/29/2014 1259   PROTEINUR Positive (A) 10/13/2017 1632   UROBILINOGEN 0.2 02/16/2017 1501   UROBILINOGEN 0.2 03/29/2014 1259   NITRITE neg 02/16/2017 1501   NITRITE NEGATIVE 03/29/2014 1259   LEUKOCYTESUR Negative 02/16/2017 1501   Sepsis Labs: @LABRCNTIP (procalcitonin:4,lacticidven:4)  ) Recent Results (from the past 240 hour(s))  Surgical pcr screen     Status: None   Collection Time: 01/09/20  7:57 PM   Specimen: Nasal Mucosa; Nasal Swab  Result Value Ref Range Status   MRSA, PCR NEGATIVE NEGATIVE Final   Staphylococcus aureus NEGATIVE NEGATIVE Final    Comment: (NOTE) The Xpert SA Assay (FDA approved for NASAL  specimens in patients 9 years of age and older), is one component of a comprehensive surveillance program. It is not intended to diagnose infection nor to guide or monitor treatment. Performed at [facility]  Hospital Lab, [place] [address] [place], [place] [address]       Radiology Studies: No results found.   Scheduled Meds: . sodium chloride   Intravenous Once  . acetaminophen  650 mg Oral Q6H  . amLODipine  10 mg Oral Daily  . atorvastatin  40 mg Oral q1800  . bisacodyl  10 mg Rectal Once  . carvedilol  25 mg Oral BID WC  . Chlorhexidine Gluconate Cloth  6 each Topical Q0600  . darbepoetin (ARANESP) injection - DIALYSIS  60 mcg Intravenous Q Tue-HD  . dexlansoprazole  60 mg Oral Daily  . docusate sodium  100 mg Oral BID  . DULoxetine  60 mg Oral Daily  . enoxaparin (LOVENOX) injection  30 mg Subcutaneous Q24H  . feeding supplement (NEPRO CARB STEADY)  237 mL Oral BID BM  . hydrALAZINE  100 mg Oral TID  . insulin aspart  0-5 Units Subcutaneous QHS  . insulin aspart  0-9 Units Subcutaneous TID WC  . insulin aspart  8 Units Subcutaneous TID WC  . insulin glargine  38 Units Subcutaneous QHS  . multivitamin  1 tablet Oral QHS  . polyethylene glycol  17 g Oral Daily  . senna  1 tablet Oral BID  . sevelamer carbonate  800 mg Oral TID WC   Continuous Infusions: . sodium chloride Stopped (01/09/20 0759)  . sodium chloride 10 mL/hr at 01/10/20 1510     LOS: 18 days   Time Spent in minutes   30 minutes  Jensen Kilburg D.O. on 01/15/2020 at 12:03 PM  Between 7am to 7pm - Please see pager noted on [email or web address]  After 7pm go to [email or web address]  And look for the night coverage person covering for me after hours  [facility] Office  [phone]

## 2020-01-15 NOTE — Care Management (Signed)
Spoke w patient at bedside, she did not know her new address, she referred me to her to daughter [name] who provided me with: [address], [place], [place] [address]

## 2020-01-15 NOTE — Plan of Care (Signed)
  Problem: Health Behavior/Discharge Planning: Goal: Ability to manage health-related needs will improve Outcome: Progressing   Problem: Clinical Measurements: Goal: Ability to maintain clinical measurements within normal limits will improve Outcome: Progressing   Problem: Activity: Goal: Risk for activity intolerance will decrease Outcome: Progressing   Problem: Nutrition: Goal: Adequate nutrition will be maintained Outcome: Progressing   Problem: Elimination: Goal: Will not experience complications related to urinary retention Outcome: Progressing   Problem: Pain Managment: Goal: General experience of comfort will improve Outcome: Progressing   Problem: Safety: Goal: Ability to remain free from injury will improve Outcome: Progressing   Problem: Skin Integrity: Goal: Risk for impaired skin integrity will decrease Outcome: Progressing

## 2020-01-16 DIAGNOSIS — N179 Acute kidney failure, unspecified: Secondary | ICD-10-CM | POA: Diagnosis not present

## 2020-01-16 DIAGNOSIS — I120 Hypertensive chronic kidney disease with stage 5 chronic kidney disease or end stage renal disease: Secondary | ICD-10-CM | POA: Diagnosis not present

## 2020-01-16 DIAGNOSIS — G8918 Other acute postprocedural pain: Secondary | ICD-10-CM | POA: Diagnosis not present

## 2020-01-16 DIAGNOSIS — E118 Type 2 diabetes mellitus with unspecified complications: Secondary | ICD-10-CM | POA: Diagnosis not present

## 2020-01-16 DIAGNOSIS — D649 Anemia, unspecified: Secondary | ICD-10-CM | POA: Diagnosis not present

## 2020-01-16 DIAGNOSIS — E871 Hypo-osmolality and hyponatremia: Secondary | ICD-10-CM | POA: Diagnosis not present

## 2020-01-16 DIAGNOSIS — N185 Chronic kidney disease, stage 5: Secondary | ICD-10-CM | POA: Diagnosis not present

## 2020-01-16 DIAGNOSIS — E8770 Fluid overload, unspecified: Secondary | ICD-10-CM | POA: Diagnosis not present

## 2020-01-16 DIAGNOSIS — T148XXA Other injury of unspecified body region, initial encounter: Secondary | ICD-10-CM | POA: Diagnosis not present

## 2020-01-16 DIAGNOSIS — E1129 Type 2 diabetes mellitus with other diabetic kidney complication: Secondary | ICD-10-CM | POA: Diagnosis not present

## 2020-01-16 DIAGNOSIS — L97524 Non-pressure chronic ulcer of other part of left foot with necrosis of bone: Secondary | ICD-10-CM | POA: Diagnosis not present

## 2020-01-16 DIAGNOSIS — M86672 Other chronic osteomyelitis, left ankle and foot: Secondary | ICD-10-CM | POA: Diagnosis not present

## 2020-01-16 DIAGNOSIS — K5900 Constipation, unspecified: Secondary | ICD-10-CM | POA: Diagnosis not present

## 2020-01-16 DIAGNOSIS — T879 Unspecified complications of amputation stump: Secondary | ICD-10-CM | POA: Diagnosis not present

## 2020-01-16 LAB — RENAL FUNCTION PANEL
Albumin: 1.7 g/dL — ABNORMAL LOW (ref 3.5–5.0)
Anion gap: 12 (ref 5–15)
BUN: 52 mg/dL — ABNORMAL HIGH (ref 6–20)
CO2: 24 mmol/L (ref 22–32)
Calcium: 8.4 mg/dL — ABNORMAL LOW (ref 8.9–10.3)
Chloride: 98 mmol/L (ref 98–111)
Creatinine, Ser: 5.11 mg/dL — ABNORMAL HIGH (ref 0.44–1.00)
GFR, Estimated: 10 mL/min — ABNORMAL LOW (ref >60)
Glucose, Bld: 91 mg/dL (ref 70–99)
Phosphorus: 4.2 mg/dL (ref 2.5–4.6)
Potassium: 4.1 mmol/L (ref 3.5–5.1)
Sodium: 134 mmol/L — ABNORMAL LOW (ref 135–145)

## 2020-01-16 LAB — GLUCOSE, CAPILLARY
Glucose-Capillary: 141 mg/dL — ABNORMAL HIGH (ref 70–99)
Glucose-Capillary: 151 mg/dL — ABNORMAL HIGH (ref 70–99)
Glucose-Capillary: 190 mg/dL — ABNORMAL HIGH (ref 70–99)

## 2020-01-16 LAB — CBC
HCT: 27.9 % — ABNORMAL LOW (ref 36.0–46.0)
Hemoglobin: 8.4 g/dL — ABNORMAL LOW (ref 12.0–15.0)
MCH: 27.7 pg (ref 26.0–34.0)
MCHC: 30.1 g/dL (ref 30.0–36.0)
MCV: 92.1 fL (ref 80.0–100.0)
Platelets: 350 10*3/uL (ref 150–400)
RBC: 3.03 MIL/uL — ABNORMAL LOW (ref 3.87–5.11)
RDW: 15.7 % — ABNORMAL HIGH (ref 11.5–15.5)
WBC: 21.7 10*3/uL — ABNORMAL HIGH (ref 4.0–10.5)
nRBC: 0 % (ref 0.0–0.2)

## 2020-01-16 LAB — SURGICAL PATHOLOGY

## 2020-01-16 MED ORDER — DARBEPOETIN ALFA 60 MCG/0.3ML IJ SOSY: 60.0000 ug | PREFILLED_SYRINGE | INTRAMUSCULAR | Status: DC

## 2020-01-16 MED ORDER — HEPARIN SODIUM (PORCINE) 1000 UNIT/ML IJ SOLN
INTRAMUSCULAR | Status: AC
  Filled 2020-01-16: qty 4

## 2020-01-16 NOTE — Progress Notes (Signed)
[facility] Progress Note  Subjective:  Seen in room. No cp, sob. Biggest issue this am is constipation. For dialysis today   Vitals:   01/15/20 1552 01/15/20 2122 01/16/20 0431 01/16/20 0500  BP: (!) 137/52 (!) 125/44 (!) 151/51   Pulse: 65 70 69   Resp: '17 18 16   ' Temp: 98.8 F (37.1 C) 99 F (37.2 C) 98.6 F (37 C)   TempSrc: Oral  Oral   SpO2: 98% 96% 96%   Weight:    93.5 kg  Height:        Exam: Gen alert, no distress Chest clear bilat to bases, no rales RRR no MRG Abd soft ntnd no mass or ascites +bs Ext trace ankle edema, L BKA w dsg Neuro is alert, Ox 3 , nf RIJ TDC c/d/i, L AC fossa incision c/d/i, L radial pulse strong and hand is warm; bruit present    Home meds:  - norvasc 5/ coreg 6.25/ asa / lipitor / hydralazine 50 tid  - insulin lantus 50u/ aspart 10u tid  - oxy IR prn/ cymbalta qd  - dexilant/ flexeril prn/ protonix 40     CXR - no acute disease     Last creat's outpatient >>      Aug 2021 - creat 4.9      Dec 23, 2019 - creat 5.2        Assessment/ Plan: 1. AoCKD 5 now ESRD- b/l creat 4.9- 5.2, eGFR 9- 11 from 8/21- 10/21. Pt f/b Dr Posey Pronto. Creat 6 on admission, now mid 7's, BUN ^^. IVF's given w/o improvement. S/p TDC and perm access 11/8, 1st HD 11/9, HD#2 11/10, #3 11/12. . No heparin.   CLIP to outpt HD underway --> she confirms she is moving to [place] "at least for a while" after d/c.  Plans had been [facility] but may need different plan --> however before changing plans need to clarify she won't need SNF prior to going 'home'.  Renal SW will f/u Mon. 2. Anemia ckd - Hb 8.4 was 6.3 on admit > got 2u prbc's, Hb in 8-9s now. Not symptomatic. Tsat low 10%, got 584m dose Feraheme on 11/06. On Aranesp 60 q week.  3. L foot stump chronic open wound - possible osteo by plain films.  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 HTN/ vol  - BP's are stable / normal on her 3 home meds 5. DM - on insulin, per pmd 6. L hip fracture - admission diagnosis. sp ORIF on  10/28 per ortho 7. BMM - started binder, phos at goal now. PTH 44.    OThomos LemonsEjigiri PA-C [facility] 01/16/2020,9:32 AM     Recent Labs  Lab 01/12/20 0028 01/12/20 0028 01/13/20 0807 01/13/20 0808 01/15/20 0300  K 3.9   < > 4.0  --  4.1  BUN 56*   < > 62*  --  42*  CREATININE 3.98*   < > 4.80*  --  4.64*  CALCIUM 8.4*   < > 8.0*  --  8.4*  PHOS 4.3  --  4.8*  --   --   HGB 8.8*  --   --  8.4*  --    < > = values in this interval not displayed.   Inpatient medications: . sodium chloride   Intravenous Once  . acetaminophen  650 mg Oral Q6H  . amLODipine  10 mg Oral Daily  . atorvastatin  40 mg Oral q1800  . bisacodyl  10  mg Rectal Once  . carvedilol  25 mg Oral BID WC  . Chlorhexidine Gluconate Cloth  6 each Topical Q0600  . darbepoetin (ARANESP) injection - DIALYSIS  60 mcg Intravenous Q Tue-HD  . dexlansoprazole  60 mg Oral Daily  . docusate sodium  100 mg Oral BID  . DULoxetine  60 mg Oral Daily  . enoxaparin (LOVENOX) injection  30 mg Subcutaneous Q24H  . feeding supplement (NEPRO CARB STEADY)  237 mL Oral BID BM  . hydrALAZINE  100 mg Oral TID  . insulin aspart  0-5 Units Subcutaneous QHS  . insulin aspart  0-9 Units Subcutaneous TID WC  . insulin aspart  8 Units Subcutaneous TID WC  . insulin glargine  38 Units Subcutaneous QHS  . multivitamin  1 tablet Oral QHS  . polyethylene glycol  17 g Oral Daily  . senna  1 tablet Oral BID  . sevelamer carbonate  800 mg Oral TID WC   . sodium chloride Stopped (01/09/20 0759)  . sodium chloride 10 mL/hr at 01/10/20 1510   sodium chloride, acetaminophen, alum & mag hydroxide-simeth, calcium carbonate, cyclobenzaprine, diphenhydrAMINE, guaiFENesin-dextromethorphan, hydrALAZINE, HYDROmorphone (DILAUDID) injection, lip balm, loratadine, menthol-cetylpyridinium **OR** phenol, Muscle Rub, ondansetron **OR** ondansetron (ZOFRAN) IV, oxyCODONE, oxyCODONE, polyvinyl alcohol, sodium chloride

## 2020-01-16 NOTE — Plan of Care (Signed)
  Problem: Health Behavior/Discharge Planning: Goal: Ability to manage health-related needs will improve Outcome: Progressing   Problem: Clinical Measurements: Goal: Ability to maintain clinical measurements within normal limits will improve Outcome: Progressing   Problem: Activity: Goal: Risk for activity intolerance will decrease Outcome: Progressing   Problem: Elimination: Goal: Will not experience complications related to bowel motility Outcome: Progressing Goal: Will not experience complications related to urinary retention Outcome: Progressing   Problem: Pain Managment: Goal: General experience of comfort will improve Outcome: Progressing   Problem: Safety: Goal: Ability to remain free from injury will improve Outcome: Progressing   Problem: Skin Integrity: Goal: Risk for impaired skin integrity will decrease Outcome: Progressing   Problem: Clinical Measurements: Goal: Postoperative complications will be avoided or minimized Outcome: Progressing

## 2020-01-16 NOTE — TOC Progression Note (Addendum)
Transition of Care [facility]) - Progression Note    Patient Details  Name: [name] MRN: [id number] Date of Birth: 1965/05/14  [facility] CM/SW Contact  Jacalyn Lefevre Edson Snowball, RN Phone Number: 01/16/2020, 1:43 PM  Clinical Narrative:     CIR following.  NCM called home health agencies listed on Medicare.gov website for patient's address [place], [place], [place] [address].   [facility] , cannot accept due to staffing , spoke to [place].   Cory with [facility] cannot accept due to staffing.  Cheryl with [facility] cannot accept due to insurance.   Sarah with [facility] cannot accept, they do not cover patient's address.   Amy with [facility] cannot accept due to staffing.  Cl[facility]e Critchley with [facility] cannot accept.  Juliann Pulse with [place] [facility] cannot accept due to staffing.  [place] with [facility] cannot accept due to staffing.  Hoyle Sauer with [facility] does not cover [place] address.   NCM has exhausted home health agency list.   NCM called patient's daughter [name] [phone] and discuss above. Also , let daughter know CIR is following however, patient would need to participate with therapy and would need insurance approval.   Daughter voiced understanding, feels she can help her mother at home . Will discuss CIR with her mother.  Discussed DME for home wheelchair, walker and 3 in1. Home has 2 to 3 steps to enter. Freda Munro with [place] will call Kaitlyn to discuss ramp cost etc. Kaitlyn aware. Expected Discharge Plan: [place] Barriers to Discharge: Continued Medical Work up  Expected Discharge Plan and Services Expected Discharge Plan: [place]   Discharge Planning Services: CM Consult Post Acute Care Choice: Home Health, Durable Medical Equipment Living arrangements for the past 2 months: Single Family Home                                       Social  Determinants of Health (SDOH) Interventions    Readmission Risk Interventions No flowsheet data found.

## 2020-01-16 NOTE — Progress Notes (Signed)
Subjective: 4 Days Post-Op Procedure(s) (LRB): AMPUTATION BELOW KNEE (Left)  Patient reports pain as mild to moderate.  Reports that she is going to try and work with therapy today.  Notes that she has not obtained her stump protector yet.  Objective:   VITALS:  Temp:  [98.6 F (37 C)-99 F (37.2 C)] 98.6 F (37 C) (11/15 0431) Pulse Rate:  [65-71] 69 (11/15 0431) Resp:  [16-18] 16 (11/15 0431) BP: (125-151)/(44-52) 151/51 (11/15 0431) SpO2:  [96 %-98 %] 96 % (11/15 0431) Weight:  [93.5 kg] 93.5 kg (11/15 0500)  General: WDWN patient in NAD. Psych:  Appropriate mood and affect. Neuro:  A&O x 3, Moving all extremities, sensation intact to light touch HEENT:  EOMs intact Chest:  Even non-labored respirations Skin:  Dressing C/D/I, no rashes or lesions Extremities: warm/dry, no visible edema, erythema or echymosis.  No lymphadenopathy. Pulses: Femoral 2+ MSK:  ROM: TKE, MMT: able to perform quad set    LABS Recent Labs    01/13/20 0808  HGB 8.4*  WBC 20.5*  PLT 337   Recent Labs    01/13/20 0807 01/15/20 0300  NA 132* 133*  K 4.0 4.1  CL 97* 101  CO2 24 23  BUN 62* 42*  CREATININE 4.80* 4.64*  GLUCOSE 198* 168*   No results for input(s): LABPT, INR in the last 72 hours.   Assessment/Plan: 4 Days Post-Op Procedure(s) (LRB): AMPUTATION BELOW KNEE (Left)  NWB L LE  Let note at front desk for RN to please check on the status of the stump protector from [facility].  I put in an order last Thursday.  It is essential that the patient have this on at all times.  Plan for 2 week outpatient post-op visit with Dr. Karie Soda ready to sign off once patient has obtained stump protector.  Mechele Claude PA-C [facility] Office:  [phone]

## 2020-01-16 NOTE — Plan of Care (Signed)
  Problem: Activity: Goal: Risk for activity intolerance will decrease Outcome: Progressing   

## 2020-01-16 NOTE — Progress Notes (Signed)
Per Jinny Blossom at [facility], stump protector to be delivered to pt room today. Moderate brown drainage noted to L hip. New dressing applied. Mechele Claude, PA notified. Will continue to monitor.

## 2020-01-16 NOTE — Progress Notes (Signed)
Orthopedic Tech Progress Note Patient Details:  [name] 10-21-1965 [id number] spoke with [facility], patient never had BK SHRINKER ordered so I just ordered it. Patient ID: [name], female   DOB: Apr 05, 1965, 54 y.o.   MRN: [id number]   Janit Pagan 01/16/2020, 11:06 AM

## 2020-01-16 NOTE — Progress Notes (Signed)
PROGRESS NOTE    ALMER LITTLETON  WSF:[id number] DOB: 1966/01/04 DOA: 12/28/2019 PCP: Julian Hy, PA-C   Brief Narrative:  HPI On 12/29/2019 by Dr. 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 Has had some difficulty with ambulating due to her left foot amputation.  Did not hit her head, no LOC, no other complaints.   Interim history  Initially admitted for left hip fracture and underwent surgery on 12/29/2019.  Hospitalization complicated by worsening leukocytosis of up to 35,000.  Patient was noted to have drainage from her left foot amputation site and was started on Ancef on 01/04/2020 for infection.  ID was consulted on 01/05/2020.  MRI on 01/08/2020 revealed an abscess and osteomyelitis.  Patient underwent tunneled dialysis catheter on 01/09/2020.  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  Patient was also started on hemodialysis and currently within the Clip process. Assessment & Plan   Left intertrochanteric fracture -Status post fall -Orthopedic surgery consulted and appreciated status post IM nail on 12/29/2019 -PT consulted recommended CIR versus home with home health and 24-hour care, rolling walker, 3 in 1, wheelchair -Continue pain control  Acute kidney injury on chronic kidney disease, stage V with metabolic acidosis, progressed to ESRD -Patient noted to have volume overload -Failed Lasix trial -Was treated on bicarb infusion -Nephrology consulted and appreciated -Status post tunnel catheter on 01/09/2020 with dialysis starting on 01/10/2020 -Renal navigator assisting with CLIP, looking for HD seat in [place], [place]  Left foot abscess and  osteomyelitis -Status post show part amputation -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 Doran Durand in 2 weeks  Constipation/IBS with diarrhea -Likely compounded by narcotics and being bedbound -Lotronex held to prevent further constipation -Continue Dulcolax suppository and bowel regimen with MiraLAX, Colace and senna -able to have a BM  Mild hyponatremia/hypochloremia -Managed with hemodialysis -improving, continue to monitor   Leukocytosis -Likely reactive  -Continue to monitor CBC -Currently patient afebrile  Diabetes mellitus, type II -Patient uses Lantus and NovoLog at home -Hemoglobin A1c was 6.2 on 12/29/2019 -Continue Lantus, insulin sliding scale, along with 8 units with meals, and CBG monitoring -CBGs appear to be controlled  GERD -Continue PPI  Essential hypertension -Continue Coreg, hydralazine, amlodipine -BP stable  Acute on chronic anemia/anemia of chronic disease -Suspect multifactorial including acute infection, hip fracture and renal disease -Hemoglobin did drop several times and patient thus far has received 4 units of PRBC as well as Feraheme -Hemoglobin 8.4 -Continue to monitor CBC   DVT Prophylaxis Lovenox  Code Status: Full  Family Communication: None at bedside  Disposition Plan:  Status is: Inpatient  Remains inpatient appropriate because:Pending clip process and discharge planning, CIR versus home with home health   Dispo: The patient is from: Home              Anticipated d/c is to: TBD              Anticipated d/c date is: > 3 days              Patient  currently is not medically stable to d/c.   Consultants Infectious disease Orthopedic surgery Vascular surgery Nephrology  Procedures  Left IM nail 12/29/2019 Left BKA  01/12/2020 Tunneled catheter placement on 01/09/2020  Antibiotics   Anti-infectives (From admission, onward)   Start     Dose/Rate Route Frequency Ordered Stop   01/12/20 0806  vancomycin (VANCOCIN) powder  Status:  Discontinued          As needed 01/12/20 0806 01/12/20 0946   01/10/20 2000  ceFAZolin (ANCEF) IVPB 1 g/50 mL premix  Status:  Discontinued        1 g 100 mL/hr over 30 Minutes Intravenous Every 24 hours 01/10/20 1343 01/14/20 1311   01/10/20 0600  ceFAZolin (ANCEF) IVPB 2g/100 mL premix        2 g 200 mL/hr over 30 Minutes Intravenous On call to O.R. 01/09/20 1952 01/11/20 0559   01/06/20 0400  ceFAZolin (ANCEF) IVPB 2g/100 mL premix  Status:  Discontinued        2 g 200 mL/hr over 30 Minutes Intravenous Every 12 hours 01/05/20 1709 01/10/20 1343   01/05/20 2200  ceFAZolin (ANCEF) IVPB 2g/100 mL premix  Status:  Discontinued        2 g 200 mL/hr over 30 Minutes Intravenous Every 8 hours 01/05/20 1707 01/05/20 1709   01/04/20 1700  ceFAZolin (ANCEF) IVPB 1 g/50 mL premix  Status:  Discontinued        1 g 100 mL/hr over 30 Minutes Intravenous Every 12 hours 01/04/20 1611 01/05/20 1707   12/29/19 1930  ceFAZolin (ANCEF) IVPB 2g/100 mL premix        2 g 200 mL/hr over 30 Minutes Intravenous Every 6 hours 12/29/19 1620 12/30/19 0201   12/29/19 1300  ceFAZolin (ANCEF) IVPB 2g/100 mL premix        2 g 200 mL/hr over 30 Minutes Intravenous On call to O.R. 12/29/19 0113 12/29/19 1325      Subjective:   Marceil Welp seen and examined today.  Left lower extremity pain but feels it is tolerable.  Denies current chest pain, shortness of breath, abdominal pain, nausea vomiting, dizziness or headache.  Was able to have a bowel movement.     Objective:   Vitals:   01/15/20 1552 01/15/20 2122 01/16/20 0431 01/16/20 0500  BP: (!) 137/52 (!) 125/44 (!) 151/51   Pulse: 65 70 69   Resp: 17 18 16    Temp: 98.8 F (37.1 C) 99 F (37.2 C) 98.6 F (37 C)   TempSrc: Oral  Oral    SpO2: 98% 96% 96%   Weight:    93.5 kg  Height:        Intake/Output Summary (Last 24 hours) at 01/16/2020 1035 Last data filed at 01/16/2020 0439 Gross per 24 hour  Intake 360 ml  Output 800 ml  Net -440 ml   Filed Weights   01/13/20 1036 01/14/20 0449 01/16/20 0500  Weight: 91.1 kg 93.5 kg 93.5 kg   Exam  General: Well developed, well nourished, NAD, appears stated age  27: NCAT, mucous membranes moist.   Cardiovascular: S1 S2 auscultated, 2/6 SEM, RRR  Respiratory: Clear to auscultation bilaterally (anteriorly)  Abdomen: Soft, nontender, nondistended, + bowel sounds  Extremities: warm dry without cyanosis clubbing or edema of RLE.  L BKA with dressing in place  Neuro: AAOx3, nonfocal  Psych: appropriate mood and affect   Data Reviewed: I have personally reviewed following labs and imaging studies  CBC: Recent Labs  Lab 01/10/20 0405 01/11/20 0139 01/12/20 0028 01/13/20 0808  WBC 19.5* 17.8* 22.5* 20.5*  HGB 8.9* 8.7* 8.8* 8.4*  HCT 27.5* 27.6* 28.0* 27.7*  MCV 86.5 87.3 87.8 91.1  PLT 328 344 320 440   Basic Metabolic Panel: Recent Labs  Lab 01/10/20 0405 01/11/20 0139 01/12/20 0028 01/13/20 0807 01/15/20 0300  NA 126* 133* 135 132* 133*  K 4.8 3.9 3.9 4.0 4.1  CL 92* 97* 100 97* 101  CO2 20* 24 25 24 23   GLUCOSE 320* 269* 199* 198* 168*  BUN 154* 94* 56* 62* 42*  CREATININE 7.90* 5.43* 3.98* 4.80* 4.64*  CALCIUM 8.6* 8.4*  8.3* 8.4* 8.0* 8.4*  MG  --  1.8  --   --   --   PHOS 7.8* 5.9* 4.3 4.8*  --    GFR: Estimated Creatinine Clearance: 17.6 mL/min (A) (by C-G formula based on SCr of 4.64 mg/dL (H)). Liver Function Tests: Recent Labs  Lab 01/10/20 0405 01/11/20 0139 01/12/20 0028 01/13/20 0807  ALBUMIN 1.7* 1.8* 1.8* 1.7*   No results for input(s): LIPASE, AMYLASE in the last 168 hours. No results for input(s): AMMONIA in the last 168 hours. Coagulation Profile: No results for input(s): INR, PROTIME in the last 168  hours. Cardiac Enzymes: No results for input(s): CKTOTAL, CKMB, CKMBINDEX, TROPONINI in the last 168 hours. BNP (last 3 results) No results for input(s): PROBNP in the last 8760 hours. HbA1C: No results for input(s): HGBA1C in the last 72 hours. CBG: Recent Labs  Lab 01/15/20 0807 01/15/20 1121 01/15/20 1656 01/15/20 2123 01/16/20 0730  GLUCAP 146* 174* 199* 218* 141*   Lipid Profile: No results for input(s): CHOL, HDL, LDLCALC, TRIG, CHOLHDL, LDLDIRECT in the last 72 hours. Thyroid Function Tests: No results for input(s): TSH, T4TOTAL, FREET4, T3FREE, THYROIDAB in the last 72 hours. Anemia Panel: No results for input(s): VITAMINB12, FOLATE, FERRITIN, TIBC, IRON, RETICCTPCT in the last 72 hours. Urine analysis:    Component Value Date/Time   COLORURINE YELLOW 03/29/2014 [place] 03/29/2014 1259   LABSPEC 1.020 03/29/2014 1259   PHURINE 7.5 03/29/2014 1259   GLUCOSEU NEGATIVE 03/29/2014 1259   HGBUR TRACE-INTACT (A) 03/29/2014 1259   BILIRUBINUR neg 02/16/2017 1501   KETONESUR NEGATIVE 03/29/2014 1259   PROTEINUR Positive (A) 10/13/2017 1632   UROBILINOGEN 0.2 02/16/2017 1501   UROBILINOGEN 0.2 03/29/2014 1259   NITRITE neg 02/16/2017 1501   NITRITE NEGATIVE 03/29/2014 1259   LEUKOCYTESUR Negative 02/16/2017 1501   Sepsis Labs: @LABRCNTIP (procalcitonin:4,lacticidven:4)  ) Recent Results (from the past 240 hour(s))  Surgical pcr screen     Status: None   Collection Time: 01/09/20  7:57 PM   Specimen: Nasal Mucosa; Nasal Swab  Result Value Ref Range Status   MRSA, PCR NEGATIVE NEGATIVE Final   Staphylococcus aureus NEGATIVE NEGATIVE Final    Comment: (NOTE) The Xpert SA Assay (FDA approved for NASAL specimens in patients 59 years of age and older), is one component of a comprehensive surveillance program. It is not intended to diagnose infection nor to guide or monitor treatment. Performed at [facility], [place] [address] [place],  [place] [address]       Radiology Studies: No results found.   Scheduled Meds: . sodium chloride   Intravenous Once  . acetaminophen  650 mg Oral Q6H  . amLODipine  10 mg Oral Daily  . atorvastatin  40 mg Oral q1800  . bisacodyl  10 mg Rectal Once  . carvedilol  25 mg Oral BID WC  . Chlorhexidine Gluconate Cloth  6 each Topical Q0600  . darbepoetin (ARANESP) injection - DIALYSIS  60 mcg Intravenous Q Tue-HD  . dexlansoprazole  60 mg Oral Daily  . docusate sodium  100 mg Oral BID  . DULoxetine  60 mg Oral Daily  . enoxaparin (LOVENOX) injection  30 mg Subcutaneous Q24H  . feeding supplement (NEPRO CARB STEADY)  237 mL Oral BID BM  . hydrALAZINE  100 mg Oral TID  . insulin aspart  0-5 Units Subcutaneous QHS  . insulin aspart  0-9 Units Subcutaneous TID WC  . insulin aspart  8 Units Subcutaneous TID WC  . insulin glargine  38 Units Subcutaneous QHS  . multivitamin  1 tablet Oral QHS  . polyethylene glycol  17 g Oral Daily  . senna  1 tablet Oral BID  . sevelamer carbonate  800 mg Oral TID WC   Continuous Infusions: . sodium chloride Stopped (01/09/20 0759)  . sodium chloride 10 mL/hr at 01/10/20 1510     LOS: 19 days   Time Spent in minutes   30 minutes  Dilyn Osoria D.O. on 01/16/2020 at 10:35 AM  Between 7am to 7pm - Please see pager noted on [email or web address]  After 7pm go to [email or web address]  And look for the night coverage person covering for me after hours  [facility] Office  [phone]

## 2020-01-16 NOTE — Progress Notes (Signed)
Renal Navigator notes new address documented in patient's chart by TOC CM over the weekend. The [facility] is only 5 minutes closer than where patient is already set up in [place]. The [facility] is much closer to the address in [place], but does not have availability at this time. [place] would be about 10 minutes closer to the home than [place], but patient is not vaccinated against COVID, and therefore, not eligible to go to [facility]. Navigator left patient's daughter [name] a message and she returned call shortly after message was left. She requests seat in [place] and states they are very interested in [place]. Questions answered. [name] is very pleasant and appreciative. Referral changed from [facility] to [facility]. Renal Navigator will continue to follow closely.  Alphonzo Cruise,  Renal Navigator [phone]

## 2020-01-16 NOTE — Progress Notes (Signed)
Inpatient Rehabilitation Admissions Coordinator  Asked by Dr. Ree Kida to reassess for Cir admit. I was previously involved in pursuing Cir admit on 11/2 when patient was at [facility] with hip fx. Insurance denied approval for Cir at that time. Now that she has begun hemodialysis and has BKA, I will follow her participation with therapy to assess her potential to admit. I met with her in hemodialysis and discussed this plan. She discussed that she has a 3 step entry into home and has not made plans for ramp, her daughter to provide transportation to outpatient dialysis and that she would be alone during the day when daughter works. I will update acute team, TOC and plan to follow her progress to assess for candidacy for CIR admit. I am not yet pursuing insurance approval for she has had limited participation with therapy postoperatively. Bed level therapy postoperatively. Please call me with any questions.   Danne Baxter, RN, MSN Rehab Admissions Coordinator [phone] 01/16/2020 11:25 AM

## 2020-01-17 DIAGNOSIS — N185 Chronic kidney disease, stage 5: Secondary | ICD-10-CM | POA: Diagnosis not present

## 2020-01-17 DIAGNOSIS — E871 Hypo-osmolality and hyponatremia: Secondary | ICD-10-CM | POA: Diagnosis not present

## 2020-01-17 DIAGNOSIS — T879 Unspecified complications of amputation stump: Secondary | ICD-10-CM | POA: Diagnosis not present

## 2020-01-17 DIAGNOSIS — K5900 Constipation, unspecified: Secondary | ICD-10-CM | POA: Diagnosis not present

## 2020-01-17 DIAGNOSIS — I120 Hypertensive chronic kidney disease with stage 5 chronic kidney disease or end stage renal disease: Secondary | ICD-10-CM | POA: Diagnosis not present

## 2020-01-17 DIAGNOSIS — D649 Anemia, unspecified: Secondary | ICD-10-CM | POA: Diagnosis not present

## 2020-01-17 DIAGNOSIS — M86672 Other chronic osteomyelitis, left ankle and foot: Secondary | ICD-10-CM | POA: Diagnosis not present

## 2020-01-17 DIAGNOSIS — T148XXA Other injury of unspecified body region, initial encounter: Secondary | ICD-10-CM | POA: Diagnosis not present

## 2020-01-17 DIAGNOSIS — G8918 Other acute postprocedural pain: Secondary | ICD-10-CM | POA: Diagnosis not present

## 2020-01-17 DIAGNOSIS — E118 Type 2 diabetes mellitus with unspecified complications: Secondary | ICD-10-CM | POA: Diagnosis not present

## 2020-01-17 DIAGNOSIS — L97524 Non-pressure chronic ulcer of other part of left foot with necrosis of bone: Secondary | ICD-10-CM | POA: Diagnosis not present

## 2020-01-17 DIAGNOSIS — N179 Acute kidney failure, unspecified: Secondary | ICD-10-CM | POA: Diagnosis not present

## 2020-01-17 DIAGNOSIS — E8770 Fluid overload, unspecified: Secondary | ICD-10-CM | POA: Diagnosis not present

## 2020-01-17 DIAGNOSIS — E1129 Type 2 diabetes mellitus with other diabetic kidney complication: Secondary | ICD-10-CM | POA: Diagnosis not present

## 2020-01-17 LAB — CBC
HCT: 26.9 % — ABNORMAL LOW (ref 36.0–46.0)
Hemoglobin: 8.1 g/dL — ABNORMAL LOW (ref 12.0–15.0)
MCH: 27.6 pg (ref 26.0–34.0)
MCHC: 30.1 g/dL (ref 30.0–36.0)
MCV: 91.5 fL (ref 80.0–100.0)
Platelets: 311 10*3/uL (ref 150–400)
RBC: 2.94 MIL/uL — ABNORMAL LOW (ref 3.87–5.11)
RDW: 15.6 % — ABNORMAL HIGH (ref 11.5–15.5)
WBC: 14.4 10*3/uL — ABNORMAL HIGH (ref 4.0–10.5)
nRBC: 0 % (ref 0.0–0.2)

## 2020-01-17 LAB — RENAL FUNCTION PANEL
Albumin: 1.7 g/dL — ABNORMAL LOW (ref 3.5–5.0)
Anion gap: 9 (ref 5–15)
BUN: 26 mg/dL — ABNORMAL HIGH (ref 6–20)
CO2: 26 mmol/L (ref 22–32)
Calcium: 8.4 mg/dL — ABNORMAL LOW (ref 8.9–10.3)
Chloride: 99 mmol/L (ref 98–111)
Creatinine, Ser: 3.42 mg/dL — ABNORMAL HIGH (ref 0.44–1.00)
GFR, Estimated: 15 mL/min — ABNORMAL LOW (ref >60)
Glucose, Bld: 207 mg/dL — ABNORMAL HIGH (ref 70–99)
Phosphorus: 3.5 mg/dL (ref 2.5–4.6)
Potassium: 4.1 mmol/L (ref 3.5–5.1)
Sodium: 134 mmol/L — ABNORMAL LOW (ref 135–145)

## 2020-01-17 LAB — GLUCOSE, CAPILLARY
Glucose-Capillary: 183 mg/dL — ABNORMAL HIGH (ref 70–99)
Glucose-Capillary: 249 mg/dL — ABNORMAL HIGH (ref 70–99)
Glucose-Capillary: 240 mg/dL — ABNORMAL HIGH (ref 70–99)
Glucose-Capillary: 273 mg/dL — ABNORMAL HIGH (ref 70–99)

## 2020-01-17 MED ORDER — DARBEPOETIN ALFA 100 MCG/0.5ML IJ SOSY
100.0000 ug | PREFILLED_SYRINGE | INTRAMUSCULAR | Status: DC
  Administered 2020-01-18: 100 ug via INTRAVENOUS
  Filled 2020-01-17: qty 0.5

## 2020-01-17 NOTE — Progress Notes (Signed)
Inpatient Rehabilitation Admissions Coordinator  I will begin insurance authorization with [place] [employer] for a possible Cir admit once I have obtained therapy updates from today.I met with patient and she is in agreement.  Danne Baxter, RN, MSN Rehab Admissions Coordinator [phone] 01/17/2020 2:54 PM

## 2020-01-17 NOTE — Progress Notes (Signed)
Occupational Therapy Treatment Patient Details Name: [name] MRN: [id number] DOB: 20-Oct-1965 Today's Date: 01/17/2020    History of present illness 54 year old female with DM, HTN, CKD 5, status post left foot Chopart amputation 2014 managed by podiatry, left SFA vascularization in 2020, prior CVA who came into the hospital after a ground-level fall resulting in left hip fracture,   S/P Intramedullary fixation, Left femur on 10/28. 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   OT comments  Pt agreeable to get OOB to recliner, but fatigued from dressing change at EOB and after x4 sit to stands, pt reports fatigue. Pt maxA+2 for sit to stands with RW and modA +2 for bed mobility and scooting up toward [place]. Pt maxA with unweighting LLE now that shrinker is on LLE.Pt tolerating sitting EOB x10 mins prior to standing. Pt would greatly benefit from continued OT skilled services. Pt srt-upA to [place] for ADL. Pt totalA for pericare at this time. Pt motivated and wanting to participate more with therapy at this time and would be a great candidate for CIR. OT following acutely.   Follow Up Recommendations  CIR    Equipment Recommendations  Wheelchair (measurements OT);3 in 1 bedside commode;Tub/shower bench    Recommendations for Other Services      Precautions / Restrictions Precautions Precautions: Fall Precaution Comments: has been limited WB on L foot plantar due to wound Restrictions Weight Bearing Restrictions: Yes LLE Weight Bearing: Non weight bearing       Mobility Bed Mobility Overal bed mobility: Needs Assistance Bed Mobility: Supine to Sit;Rolling;Sit to Sidelying Rolling: Min assist   Supine to sit: Mod assist;+2 for physical assistance     General bed mobility comments: Pt usign bed rails and assist for BLEs and trunk for movement;Pt had more success rolling on R side at EOB for sidelying position to sitting up to lying in supine  Transfers Overall transfer level: Needs  assistance Equipment used: None Transfers: Lateral/Scoot Transfers Sit to Stand: Max assist;+2 physical assistance;From elevated surface        Lateral/Scoot Transfers: Mod assist;+2 physical assistance;+2 safety/equipment General transfer comment: Pt able to stand <5 secs x1 times; 3 other standing attempts. pt was unable to stand upright without leaning backwards. Pt requiring modA to scoot along bed laterally.    Balance Overall balance assessment: Needs assistance;History of Falls Sitting-balance support: Bilateral upper extremity supported   Sitting balance - Comments: sitting EOB fair balnace                                   ADL either performed or assessed with clinical judgement   ADL Overall ADL's : Needs assistance/impaired     Grooming: Set up;Sitting;Wash/dry face       Lower Body Bathing: Maximal assistance;Cueing for safety;Bed level           Toilet Transfer: Total assistance;+2 for physical assistance;+2 for safety/equipment           Functional mobility during ADLs: Moderate assistance;+2 for physical assistance;+2 for safety/equipment General ADL Comments: Declining to perform ADLs at this time. Self limiting behavior     Vision   Vision Assessment?: No apparent visual deficits   Perception     Praxis      Cognition Arousal/Alertness: Awake/alert Behavior During Therapy: Flat affect Overall Cognitive Status: Within Functional Limits for tasks assessed  General Comments: Self limiting        Exercises     Shoulder Instructions       General Comments Pt agreeable to get OOB to recliner, but fatigued from dressing change at EOB and after x4 sit to stands, pt reports fatigue.    Pertinent Vitals/ Pain       Pain Assessment: 0-10 Pain Score: 4  Pain Location: Left Hip and residual limb Pain Descriptors / Indicators: Guarding;Grimacing;Sore;Constant Pain Intervention(s):  Monitored during session;Repositioned  Home Living                                          Prior Functioning/Environment              Frequency  Min 2X/week        Progress Toward Goals  OT Goals(current goals can now be found in the care plan section)  Progress towards OT goals: Progressing toward goals  Acute Rehab OT Goals Patient Stated Goal: to reduce pain OT Goal Formulation: With patient Time For Goal Achievement: 01/31/20 Potential to Achieve Goals: Good ADL Goals Pt Will Perform Lower Body Dressing: with mod assist;with adaptive equipment;sitting/lateral leans;sit to/from stand Pt Will Transfer to Toilet: with mod assist;squat pivot transfer;bedside commode Pt Will Perform Toileting - Clothing Manipulation and hygiene: with mod assist;sit to/from stand Additional ADL Goal #1: Patient will be supervision level for bed mobility in preparation for self care tasks.  Plan Discharge plan needs to be updated    Co-evaluation    PT/OT/SLP Co-Evaluation/Treatment: Yes Reason for Co-Treatment: For patient/therapist safety;To address functional/ADL transfers   OT goals addressed during session: ADL's and self-care;Strengthening/ROM      AM-PAC OT "6 Clicks" Daily Activity     Outcome Measure   Help from another person eating meals?: None Help from another person taking care of personal grooming?: A Little Help from another person toileting, which includes using toliet, bedpan, or urinal?: Total Help from another person bathing (including washing, rinsing, drying)?: A Lot Help from another person to put on and taking off regular upper body clothing?: A Little Help from another person to put on and taking off regular lower body clothing?: A Lot 6 Click Score: 15    End of Session Equipment Utilized During Treatment: Gait belt;Rolling walker  OT Visit Diagnosis: Pain;Muscle weakness (generalized) (M62.81);Unsteadiness on feet (R26.81) Pain -  Right/Left: Left Pain - part of body: Hip;Ankle and joints of foot   Activity Tolerance Patient limited by pain   Patient Left in bed;with call bell/phone within reach   Nurse Communication Mobility status        Time: 9735-3299 OT Time Calculation (min): 43 min  Charges: OT General Charges $OT Visit: 1 Visit OT Treatments $Therapeutic Activity: 8-22 mins  Jefferey Pica, OTR/L Acute Rehabilitation Services Pager: [phone] Office: [phone]    Natilie Krabbenhoft C 01/17/2020, 2:56 PM

## 2020-01-17 NOTE — PMR Pre-admission (Signed)
PMR Admission Coordinator Pre-Admission Assessment  Patient: [name] is an 54 y.o., female MRN: [id number] DOB: Feb 08, 1966 Height: '5\' 10"'  (177.8 cm) Weight: 88.4 kg  Insurance Information HMO:     PPO: yes     PCP:      IPA:      80/20:      OTHER:  PRIMARY: [place] [employer]      Policy#: [id number]      Subscriber: pt CM Name: approved on expedited appeal per VM   Phone#: [phone]     Fax#: [phone] Pre-Cert#: [id number]    Approved for 13 days when updates are due   Employer:  Benefits:  Phone #: via Availity     Name: 11/16 Eff. Date: 09/01/2019 until 01/31/2020     Deduct: none      Out of Pocket Max: none CIR: covered per medicaid guidelines      SNF: covered per medicaid guidelines; days limited by medical neccesity Outpatient: $30 per visit     Co-Pay: visits per medical neccesity Home Health: 100%      Co-Pay: visits per medical neccesity DME: 100 %      Co-Pay: per medical neccesity Providers: in network  SECONDARY: none      Policy#:      Phone#:   Development worker, community:       Phone#:   The Engineer, petroleum" for patients in Inpatient Rehabilitation Facilities with attached "Privacy Act [place] Records" was provided and verbally reviewed with: N/A  Emergency Contact Information Contact Information    Name Relation Home Work Marana, [name] Daughter   [phone]      Current Medical History  Patient Admitting Diagnosis: BKA, new ESRD on hemodialysis  History of Present Illness: 54 year old female with past medical history of diabetes, HTN, CKD 5, s/p left foot [place] amputation 2014, an left SFA revascularization 12/17/2018, stroke who presented on 12/28/2019 by EMS from home after falling on her left hip on 1/15 after tripping with worsening left hip pain and inability to bear weight. Has had difficulty in ambulating due to her left foot amputation.   Initially admitted for left hip fracture and  underwent IM nailing at [facility] on 12/29/2019. Hospitalization complicated by worsening leukocytosis of up to 35,000. Noted drainage from her left foot amputation site and Ancef was started. ID consulted. MRI on 01/08/2020 revealed an abscess and osteomyelitis. 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  Consulted Dr. Lyla Glassing on 11/18 due to purulent drainage form left hip surgical site. S/p I and D of left hip abscess on 11/18. Awaiting final culture sensitivity prior to calling ID for antibiotic choice and duration recommendation. JP drain to be removed today 11/22 per ortho. Cultures growing enterobacter Cloacae and ID updated recommendation for prolonged course of Cefepime of 4 weeks with dialysis.   Patient with acute kidney injury on chronic kidney disease, stage V which has progressed to ESRD. Nephrology consulted. Failed lasix trial, treated on bicarb infusion. Status post tunnel catheter on 11/8 with dialysis began on 01/10/2020 when patient transferred to [facility]. Renal navigator has obtained HD seat in [place].schedule to be MWF at 1210.  Patient uses Lantus and Novolog at home. Hgb A1c 6.2. continuing Lantus, insulin sliding scale, along with 8 UNITS with meals and CBG monitoring. Acute on chronic anemia and patient has received transfusions as well as Feraheme. DVT prophylaxis with Lovenox.  Patient's medical record from [facility]  and [facility] has been reviewed by the rehabilitation admission coordinator and physician.  Past Medical History  Past Medical History:  Diagnosis Date  . Anxiety 2002  . Chest tightness   . Depression 2001  . Diabetes type 1, uncontrolled ([place])    at age 63  . Diabetic neuropathy ([place])   . Essential hypertension 2015  . GERD (gastroesophageal reflux disease)    about age of 80  . Nausea and vomiting in adult   . Stroke ([place])   . Urinary frequency   . Yeast vaginitis     Family History   family history  includes Diabetes in her father, mother, and sister; Heart attack in her father and mother; Heart disease in her father and mother; Hyperlipidemia in her mother; Hypertension in her mother and sister.  Prior Rehab/Hospitalizations Has the patient had prior rehab or hospitalizations prior to admission? Yes  Has the patient had major surgery during 100 days prior to admission? Yes   Current Medications  Current Facility-Administered Medications:  .  (feeding supplement) PROSource Plus liquid 30 mL, 30 mL, Oral, BID BM, Swinteck, Aaron Edelman, MD, 30 mL at 01/22/20 1323 .  0.9 %  sodium chloride infusion (Manually program via Guardrails IV Fluids), , Intravenous, Once, Roney Jaffe, MD .  0.9 %  sodium chloride infusion, , Intravenous, PRN, Rod Can, MD, Stopped at 01/09/20 0759 .  0.9 %  sodium chloride infusion, , Intravenous, Continuous, Swinteck, Aaron Edelman, MD, Last Rate: 10 mL/hr at 01/10/20 1510, New Bag at 01/19/20 1423 .  acetaminophen (TYLENOL) tablet 325-650 mg, 325-650 mg, Oral, Q6H PRN, Rod Can, MD, 650 mg at 01/23/20 0849 .  amLODipine (NORVASC) tablet 10 mg, 10 mg, Oral, Daily, Swinteck, Aaron Edelman, MD, 10 mg at 01/22/20 1610 .  atorvastatin (LIPITOR) tablet 40 mg, 40 mg, Oral, q1800, Rod Can, MD, 40 mg at 01/22/20 1849 .  calcium carbonate (TUMS - dosed in mg elemental calcium) chewable tablet 200 mg of elemental calcium, 1 tablet, Oral, TID PRN, Rod Can, MD, 200 mg of elemental calcium at 12/30/19 0834 .  carvedilol (COREG) tablet 25 mg, 25 mg, Oral, BID WC, Swinteck, Aaron Edelman, MD, 25 mg at 01/23/20 0845 .  ceFEPIme (MAXIPIME) 1 g in sodium chloride 0.9 % 100 mL IVPB, 1 g, Intravenous, Q24H, Einar Grad, RPH, Paused at 01/22/20 1854 .  Chlorhexidine Gluconate Cloth 2 % PADS 6 each, 6 each, Topical, Q0600, Collins, Samantha G, PA-C .  cyclobenzaprine (FLEXERIL) tablet 10 mg, 10 mg, Oral, BID PRN, Rod Can, MD, 10 mg at 01/23/20 0849 .  [START ON  01/27/2020] Darbepoetin Alfa (ARANESP) injection 100 mcg, 100 mcg, Intravenous, Q Fri-HD, Collins, Samantha G, PA-C .  dexlansoprazole (DEXILANT) capsule 60 mg, 60 mg, Oral, Daily, Swinteck, Aaron Edelman, MD, 60 mg at 01/15/20 1006 .  diphenhydrAMINE (BENADRYL) 12.5 MG/5ML elixir 12.5-25 mg, 12.5-25 mg, Oral, Q4H PRN, Swinteck, Aaron Edelman, MD .  docusate sodium (COLACE) capsule 100 mg, 100 mg, Oral, BID, Swinteck, Aaron Edelman, MD, 100 mg at 01/21/20 2201 .  DULoxetine (CYMBALTA) DR capsule 60 mg, 60 mg, Oral, Daily, Swinteck, Aaron Edelman, MD, 60 mg at 01/22/20 0914 .  enoxaparin (LOVENOX) injection 30 mg, 30 mg, Subcutaneous, Q24H, Swinteck, Aaron Edelman, MD, 30 mg at 01/23/20 0846 .  guaiFENesin-dextromethorphan (ROBITUSSIN DM) 100-10 MG/5ML syrup 5 mL, 5 mL, Oral, Q4H PRN, Swinteck, Aaron Edelman, MD .  hydrALAZINE (APRESOLINE) tablet 10 mg, 10 mg, Oral, Q6H PRN, Rod Can, MD, 10 mg at 12/31/19 0454 .  hydrALAZINE (APRESOLINE) tablet 100  mg, 100 mg, Oral, TID, Swinteck, Aaron Edelman, MD, 100 mg at 01/22/20 2209 .  HYDROmorphone (DILAUDID) injection 0.5-1 mg, 0.5-1 mg, Intravenous, Q4H PRN, Rod Can, MD, 1 mg at 01/22/20 1353 .  insulin aspart (novoLOG) injection 0-5 Units, 0-5 Units, Subcutaneous, QHS, Rod Can, MD, 3 Units at 01/21/20 2159 .  insulin aspart (novoLOG) injection 0-9 Units, 0-9 Units, Subcutaneous, TID WC, Rod Can, MD, 1 Units at 01/22/20 0926 .  insulin aspart (novoLOG) injection 8 Units, 8 Units, Subcutaneous, TID WC, Rod Can, MD, 8 Units at 01/23/20 0846 .  insulin glargine (LANTUS) injection 38 Units, 38 Units, Subcutaneous, QHS, Rod Can, MD, 38 Units at 01/22/20 2207 .  lip balm (CARMEX) ointment 1 application, 1 application, Topical, PRN, Swinteck, Aaron Edelman, MD .  loratadine (CLARITIN) tablet 10 mg, 10 mg, Oral, Daily PRN, Swinteck, Aaron Edelman, MD .  menthol-cetylpyridinium (CEPACOL) lozenge 3 mg, 1 lozenge, Oral, PRN **OR** phenol (CHLORASEPTIC) mouth spray 1 spray, 1 spray,  Mouth/Throat, PRN, Swinteck, Aaron Edelman, MD .  metoCLOPramide (REGLAN) tablet 5-10 mg, 5-10 mg, Oral, Q8H PRN **OR** metoCLOPramide (REGLAN) injection 5-10 mg, 5-10 mg, Intravenous, Q8H PRN, Swinteck, Aaron Edelman, MD .  metroNIDAZOLE (FLAGYL) tablet 500 mg, 500 mg, Oral, Q8H, Mignon [place], DO .  multivitamin (RENA-VIT) tablet 1 tablet, 1 tablet, Oral, QHS, Swinteck, Aaron Edelman, MD, 1 tablet at 01/22/20 2207 .  Muscle Rub CREA 1 application, 1 application, Topical, PRN, Swinteck, Aaron Edelman, MD .  ondansetron (ZOFRAN) tablet 4 mg, 4 mg, Oral, Q6H PRN **OR** ondansetron (ZOFRAN) injection 4 mg, 4 mg, Intravenous, Q6H PRN, Swinteck, Aaron Edelman, MD .  oxyCODONE (Oxy IR/ROXICODONE) immediate release tablet 10-15 mg, 10-15 mg, Oral, Q4H PRN, Rod Can, MD, 15 mg at 01/22/20 2312 .  oxyCODONE (Oxy IR/ROXICODONE) immediate release tablet 5-10 mg, 5-10 mg, Oral, Q4H PRN, Swinteck, Aaron Edelman, MD .  polyethylene glycol (MIRALAX / GLYCOLAX) packet 17 g, 17 g, Oral, Daily, Swinteck, Aaron Edelman, MD, 17 g at 01/18/20 1338 .  polyvinyl alcohol (LIQUIFILM TEARS) 1.4 % ophthalmic solution 1 drop, 1 drop, Both Eyes, PRN, Swinteck, Aaron Edelman, MD .  senna (SENOKOT) tablet 8.6 mg, 1 tablet, Oral, BID, Swinteck, Aaron Edelman, MD, 8.6 mg at 01/21/20 2201 .  sevelamer carbonate (RENVELA) tablet 800 mg, 800 mg, Oral, TID WC, Swinteck, Aaron Edelman, MD, 800 mg at 01/23/20 0845 .  sodium chloride (OCEAN) 0.65 % nasal spray 1 spray, 1 spray, Each Nare, PRN, Rod Can, MD  Patients Current Diet:  Diet Order            Diet - low sodium heart healthy           Diet Carb Modified           Diet renal with fluid restriction Fluid restriction: 1200 mL Fluid; Room service appropriate? Yes; Fluid consistency: Thin  Diet effective now                 Precautions / Restrictions Precautions Precautions: Fall Precaution Comments: new L BKA Restrictions Weight Bearing Restrictions: Yes LLE Weight Bearing: Non weight bearing Other Position/Activity  Restrictions: WBAT on Left hip but has been essentially NWB on L plantar PTA   Has the patient had 2 or more falls or a fall with injury in the past year? yes  Prior Activity Level Limited Community (1-2x/wk): mod I with knee walker  Prior Functional Level Self Care: Did the patient need help bathing, dressing, using the toilet or eating? Independent  Indoor Mobility: Did the patient need assistance with walking from room to  room (with or without device)? Independent  Stairs: Did the patient need assistance with internal or external stairs (with or without device)? Independent  Functional Cognition: Did the patient need help planning regular tasks such as shopping or remembering to take medications? [place] / Equipment Home Assistive Devices/Equipment: CBG Meter, Other (Comment), Walker (specify type), Bedside commode/3-in-1, Shower chair without back (walk-in shower, tub/shower unit, toilet riser, front wheeled walker) Home Equipment: Walker - 2 wheels, Bedside commode, Toilet riser  Prior Device Use: Indicate devices/aids used by the patient prior to current illness, exacerbation or injury? knee walker  Current Functional Level Cognition  Overall Cognitive Status: Within Functional Limits for tasks assessed Orientation Level: Oriented X4 General Comments: Pt required encouragement to redirect to progression of activity.    Extremity Assessment (includes Sensation/Coordination)  Upper Extremity Assessment: Generalized weakness  Lower Extremity Assessment: LLE deficits/detail LLE Deficits / Details: L BKA    ADLs  Overall ADL's : Needs assistance/impaired Eating/Feeding: Independent, Sitting Grooming: Set up, Sitting, Wash/dry face Grooming Details (indicate cue type and reason): Washed face seated at side of bed with back unsupported. Upper Body Bathing: Set up, Sitting Lower Body Bathing: Maximal assistance, Cueing for safety, Bed level Upper  Body Dressing : Set up, Sitting Lower Body Dressing: Maximal assistance, Sitting/lateral leans, Sit to/from stand Lower Body Dressing Details (indicate cue type and reason): d/t pain in L LE with mobility Toilet Transfer: Total assistance, +2 for physical assistance, +2 for safety/equipment Toilet Transfer Details (indicate cue type and reason): Total assist to transfer to BSc with use of [name]. +3 assistance for safety to manage patient, equipment, and patient's LLE due to pain. Toileting- Clothing Manipulation and Hygiene: Total assistance, Sitting/lateral lean Toileting - Clothing Manipulation Details (indicate cue type and reason): Total assist for toileting on BSC. Functional mobility during ADLs: Moderate assistance, +2 for physical assistance, +2 for safety/equipment General ADL Comments: Declining to perform ADLs at this time. Self limiting behavior    Mobility  Overal bed mobility: Needs Assistance Bed Mobility: Supine to Sit Rolling: Min assist Supine to sit: Mod assist Sit to supine: Mod assist, +2 for physical assistance General bed mobility comments: Assistance with LLE to edge of bed and for trunk elevation.  Once sitting edge of bed she was able to maintain her balance.    Transfers  Overall transfer level: Needs assistance Equipment used: Rolling walker (2 wheeled) Transfer via Lift Equipment: Stedy Transfers: Sit to/from Stand Sit to Stand: Mod assist, +2 physical assistance Stand pivot transfers: Mod assist, +2 physical assistance  Lateral/Scoot Transfers: Mod assist, +2 physical assistance, +2 safety/equipment General transfer comment: Pt stood x 1.5 min edge of bed with mod +1 to maintain.  Performed additional trial mod +2 to perform pericare.  Pt able to pivot on R foot to move up to [facility].    Ambulation / Gait / Stairs / Wheelchair Mobility  Ambulation/Gait Ambulation/Gait assistance:  (NT) General Gait Details: NT    Posture / Balance Dynamic Sitting  Balance Sitting balance - Comments: sitting EOB with supervision Balance Overall balance assessment: Needs assistance, History of Falls Sitting-balance support: Bilateral upper extremity supported Sitting balance-Leahy Scale: Fair Sitting balance - Comments: sitting EOB with supervision Standing balance support: Bilateral upper extremity supported, During functional activity Standing balance-Leahy Scale: Poor Standing balance comment: reliant on UEs    Special needs/care consideration New ESRD to hemodialysis this admission Outpatient hemodialysis center [place] MWF Patient states daughter to provide transportation to Hemodialysis Patient  states daughter checking with Landlord about ramp Patient address to be [address], [place], [place] [address] Acute CM has exhausted home health agency list per notes 11/15. No Home health arranged due to staffing or territory issues    Previous Home Environment  Living Arrangements: Children (daughter)  Lives With: Daughter Available Help at Discharge:  (daughter works day shift) Type of Home: Mobile home Home Layout: One level Home Access: Stairs to enter Entrance Stairs-Rails: None Technical brewer of Steps: 3 Bathroom Shower/Tub: Multimedia programmer: Standard Bathroom Accessibility: Yes How Accessible: Accessible via walker Handley: No Additional Comments: Pt unsure if bathroom has tub or shower and if doors are w/c accessable.  Discharge Living Setting Plans for Discharge Living Setting: Patient's home, Lives with (comment), Mobile Home (daughter) Type of Home at Discharge: Mobile home Discharge Home Layout: One level Discharge Home Access: Stairs to enter Entrance Stairs-Rails: None Entrance Stairs-Number of Steps: 3 Discharge Bathroom Shower/Tub: Walk-in shower Discharge Bathroom Toilet: Standard Discharge Bathroom Accessibility: Yes How Accessible: Accessible via walker Does the patient have any  problems obtaining your medications?: No  Social/Family/Support Systems Contact Information: daughter, Verlee Monte Anticipated Caregiver: daughter Anticipated Caregiver's Contact Information: see above Ability/Limitations of Caregiver: daughter works day shift Caregiver Availability: Evenings only Discharge Plan Discussed with Primary Caregiver: Yes Is Caregiver In Agreement with Plan?: Yes Does Caregiver/Family have Issues with Lodging/Transportation while Pt is in Rehab?: No  Goals Patient/Family Goal for Rehab: mod I with PT and OT at wheelchair level Expected length of stay: ELOS 7 to 10 days Additional Information: New ESRD on hemodialysis this admission Pt/Family Agrees to Admission and willing to participate: Yes Program Orientation Provided & Reviewed with Pt/Caregiver Including Roles  & Responsibilities: Yes  Decrease burden of Care through IP rehab admission: n/a  Possible need for SNF placement upon discharge: not anticipated  Patient Condition: I have reviewed medical records from [facility] and [facility] , spoken with CM, and patient. I met with patient at the bedside for inpatient rehabilitation assessment.  Patient will benefit from ongoing PT and OT, can actively participate in 3 hours of therapy a day 5 days of the week, and can make measurable gains during the admission.  Patient will also benefit from the coordinated team approach during an Inpatient Acute Rehabilitation admission.  The patient will receive intensive therapy as well as Rehabilitation physician, nursing, social worker, and care management interventions.  Due to bladder management, bowel management, safety, skin/wound care, disease management, medication administration, pain management and patient education the patient requires 24 hour a day rehabilitation nursing.  The patient is currently mod assist with mobility and basic ADLs.  Discharge setting and therapy post discharge at home with home  health is anticipated.  Patient has agreed to participate in the Acute Inpatient Rehabilitation Program and will admit today.  Preadmission Screen Completed By:  Cleatrice Burke, 01/23/2020 11:04 AM ______________________________________________________________________   Discussed status with Dr. Ranell Patrick on  01/23/2020 at  1105 and received approval for admission today.  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 Does the need for close, 24 hr/day Medical supervision in concert with the patient's rehab needs make it unreasonable for this patient to be served in a less intensive setting? Yes 2. Co-Morbidities requiring supervision/potential complications: uncontrolled type 2 DM with complications, CKD stage 5, AKI, volume overload, acute on chronic anemia, IBS 3.  Due to bladder management, bowel management, safety, skin/wound care, disease management, medication administration, pain management and patient education, does the patient require 24 hr/day rehab nursing? Yes 4. Does the patient require coordinated care of a physician, rehab nurse, PT, OT to address physical and functional deficits in the context of the above medical diagnosis(es)? Yes Addressing deficits in the following areas: balance, endurance, locomotion, strength, transferring, bowel/bladder control, bathing, dressing, feeding, grooming, toileting and psychosocial support 5. Can the patient actively participate in an intensive therapy program of at least 3 hrs of therapy 5 days a week? Yes 6. The potential for patient to make measurable gains while on inpatient rehab is excellent 7. Anticipated functional outcomes upon discharge from inpatient rehab: min assist PT, min assist OT, independent SLP 8. Estimated rehab length of stay to reach the above functional goals is: 2-3 weeks 9. Anticipated discharge  destination: Home 10. Overall Rehab/Functional Prognosis: excellent   MD Signature: Leeroy Cha, MD

## 2020-01-17 NOTE — Progress Notes (Signed)
[facility] Progress Note  Subjective:  Seen in room. No new complaints. Tolerating dialysis well. Disposition remains unclear.   Vitals:   01/16/20 1451 01/16/20 1956 01/17/20 0500 01/17/20 0946  BP: (!) 158/58 (!) 138/53 (!) 150/54 (!) 143/56  Pulse: 76 71  68  Resp: 20 18    Temp:  98.8 F (37.1 C) 98.4 F (36.9 C) 98.2 F (36.8 C)  TempSrc:  Oral Oral Oral  SpO2: 100% 97% 99% 97%  Weight:      Height:        Exam: Gen alert, no distress Chest clear bilat to bases, no rales RRR no MRG Abd soft ntnd no mass or ascites +bs Ext trace ankle edema, L BKA w dsg Neuro is alert, Ox 3 , nf RIJ TDC c/d/i, L AC fossa incision c/d/i, L radial pulse strong and hand is warm; bruit present    Home meds:  - norvasc 5/ coreg 6.25/ asa / lipitor / hydralazine 50 tid  - insulin lantus 50u/ aspart 10u tid  - oxy IR prn/ cymbalta qd  - dexilant/ flexeril prn/ protonix 40     CXR - no acute disease     Last creat's outpatient >>      Aug 2021 - creat 4.9      Dec 23, 2019 - creat 5.2        Assessment/ Plan: 1. AoCKD 5 now ESRD- b/l creat 4.9- 5.2, eGFR 9- 11 from 8/21- 10/21. Pt f/b Dr Posey Pronto. Creat 6 on admission, now mid 7's, BUN ^^. IVF's given w/o improvement. S/p TDC and perm access 11/8, 1st HD 11/9, HD#2 11/10, #3 11/12.  No heparin.   CLIP to outpt HD underway. Next HD 11/17.  2. Anemia ckd - Hb 8.1, trending down.  Was 6.3 on admit > got 2u prbc's, Not symptomatic. Tsat low 10%, got 580m dose Feraheme on 11/06. Increase Aranesp 60 to 100 q week on 11/17.  3. L foot stump chronic open wound - possible osteo by plain films.  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 HTN/ vol  - BP's are stable / normal on her 3 home meds 5. DM - on insulin, per pmd 6. L hip fracture - admission diagnosis. sp ORIF on 10/28 per ortho 7. BMM - started binder, phos at goal now. PTH 44.  8. Dispo. CLIP to outpatient HD in progress. [facility] Still need to clairify  discharge plans. HH vs SNF vs inpatient rehab.    OLynnda ChildPA-C [facility] 01/17/2020,11:49 AM     Recent Labs  Lab 01/16/20 1130 01/17/20 0613  K 4.1 4.1  BUN 52* 26*  CREATININE 5.11* 3.42*  CALCIUM 8.4* 8.4*  PHOS 4.2 3.5  HGB 8.4* 8.1*   Inpatient medications: . sodium chloride   Intravenous Once  . acetaminophen  650 mg Oral Q6H  . amLODipine  10 mg Oral Daily  . atorvastatin  40 mg Oral q1800  . bisacodyl  10 mg Rectal Once  . carvedilol  25 mg Oral BID WC  . Chlorhexidine Gluconate Cloth  6 each Topical Q0600  . [START ON 01/18/2020] darbepoetin (ARANESP) injection - DIALYSIS  60 mcg Intravenous Q Wed-HD  . dexlansoprazole  60 mg Oral Daily  . docusate sodium  100 mg Oral BID  . DULoxetine  60 mg Oral Daily  . enoxaparin (LOVENOX) injection  30 mg Subcutaneous Q24H  . feeding supplement (NEPRO CARB STEADY)  237 mL Oral BID BM  . hydrALAZINE  100 mg Oral TID  . insulin aspart  0-5 Units Subcutaneous QHS  . insulin aspart  0-9 Units Subcutaneous TID WC  . insulin aspart  8 Units Subcutaneous TID WC  . insulin glargine  38 Units Subcutaneous QHS  . multivitamin  1 tablet Oral QHS  . polyethylene glycol  17 g Oral Daily  . senna  1 tablet Oral BID  . sevelamer carbonate  800 mg Oral TID WC   . sodium chloride Stopped (01/09/20 0759)  . sodium chloride 10 mL/hr at 01/10/20 1510   sodium chloride, acetaminophen, calcium carbonate, cyclobenzaprine, diphenhydrAMINE, guaiFENesin-dextromethorphan, hydrALAZINE, HYDROmorphone (DILAUDID) injection, lip balm, loratadine, menthol-cetylpyridinium **OR** phenol, Muscle Rub, ondansetron **OR** ondansetron (ZOFRAN) IV, oxyCODONE, oxyCODONE, polyvinyl alcohol, sodium chloride

## 2020-01-17 NOTE — Progress Notes (Signed)
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    PT Comments    Patient agreeable to participate with therapy this session and also agreeable for OOB to recliner, however after activity at EOB, patient easily fatigued and unable to get to recliner. Patient performed sit to stand x 4 with RW and maxA+2, able to stand <5 seconds x1 and able to clear hips from bed x3. Patient requires modA+2 for lateral scoot to [facility] with cues for hand and foot placement.  Patient tolerated EOB activity x10 minutes prior to standing for set up for linen change and dressing change by RN due to increased amount of drainage on bed pad. Patient seems more motivated and willing to participate with therapy. Patient continues to be limited by pain, decreased activity tolerance, generalized weakness, impaired balance, and impaired functional mobility. Continue to recommend comprehensive inpatient rehab (CIR) for post-acute therapy needs.    Follow Up Recommendations  CIR;Supervision/Assistance - 24 hour     Equipment Recommendations  Wheelchair (measurements PT);Wheelchair cushion (measurements PT);Rolling Farrel Guimond with 5" wheels;3in1 (PT)    Recommendations for Other Services Rehab consult     Precautions / Restrictions Precautions Precautions: Fall Precaution Comments: has been limited WB on L foot plantar due to wound Restrictions Weight Bearing Restrictions: Yes LLE Weight Bearing: Non weight bearing    Mobility  Bed Mobility Overal bed mobility: Needs Assistance Bed Mobility: Supine to  Sit;Rolling;Sit to Sidelying Rolling: Min assist   Supine to sit: Mod assist;+2 for physical assistance Sit to supine: Mod assist;+2 for physical assistance   General bed mobility comments: Assist for trunk and L residual limb for supine<>sit  Transfers Overall transfer level: Needs assistance Equipment used: Rolling Vasiliy Mccarry (2 wheeled) Transfers: Sit to/from Stand;Lateral/Scoot Transfers Sit to Stand: Max assist;+2 physical assistance;From elevated surface        Lateral/Scoot Transfers: Mod assist;+2 physical assistance;+2 safety/equipment General transfer comment: Pt able to stand <5 secs x1 times; 3 other standing attempts. pt was unable to stand upright without leaning backwards. Pt requiring modA to scoot along bed laterally.  Ambulation/Gait                 Stairs             Wheelchair Mobility    Modified Rankin (Stroke Patients Only)       Balance Overall balance assessment: Needs assistance;History of Falls Sitting-balance support: Bilateral upper extremity supported Sitting balance-Leahy Scale: Fair Sitting balance - Comments: sitting EOB with supervision   Standing balance support: Bilateral upper extremity supported;During functional activity Standing balance-Leahy Scale: Poor                              Cognition Arousal/Alertness: Awake/alert Behavior During Therapy: Flat affect Overall Cognitive Status: Within Functional Limits for tasks assessed                                 General Comments: Self limiting      Exercises  General Comments General comments (skin integrity, edema, etc.): Patient agreeable to participate this session and agreeable to OOB to recliner. Patient easily fatigued with sit to stand x4 and dressing change      Pertinent Vitals/Pain Pain Assessment: 0-10 Pain Score: 4  Pain Location: Left Hip and residual limb Pain Descriptors / Indicators:  Guarding;Grimacing;Sore;Constant Pain Intervention(s): Monitored during session    Home Living                      Prior Function            PT Goals (current goals can now be found in the care plan section) Acute Rehab PT Goals Patient Stated Goal: to reduce pain PT Goal Formulation: With patient Time For Goal Achievement: 01/25/20 Potential to Achieve Goals: Fair Progress towards PT goals: Progressing toward goals    Frequency    Min 3X/week      PT Plan Current plan remains appropriate    Co-evaluation PT/OT/SLP Co-Evaluation/Treatment: Yes Reason for Co-Treatment: For patient/therapist safety;To address functional/ADL transfers PT goals addressed during session: Mobility/safety with mobility OT goals addressed during session: ADL's and self-care;Strengthening/ROM      AM-PAC PT "6 Clicks" Mobility   Outcome Measure  Help needed turning from your back to your side while in a flat bed without using bedrails?: A Little Help needed moving from lying on your back to sitting on the side of a flat bed without using bedrails?: A Lot Help needed moving to and from a bed to a chair (including a wheelchair)?: A Lot Help needed standing up from a chair using your arms (e.g., wheelchair or bedside chair)?: A Lot Help needed to walk in hospital room?: Total Help needed climbing 3-5 steps with a railing? : Total 6 Click Score: 11    End of Session Equipment Utilized During Treatment: Gait belt Activity Tolerance: Patient limited by fatigue;Patient limited by pain Patient left: in bed;with call bell/phone within reach Nurse Communication: Mobility status PT Visit Diagnosis: Muscle weakness (generalized) (M62.81);Other abnormalities of gait and mobility (R26.89) Pain - Right/Left: Left Pain - part of body: Hip;Knee     Time: 7262-0355 PT Time Calculation (min) (ACUTE ONLY): 51 min  Charges:  $Therapeutic Activity: 23-37 mins                     Perrin Maltese, PT, DPT Acute Rehabilitation Services Pager [phone] Office [phone]    [name] 01/17/2020, 3:21 PM

## 2020-01-17 NOTE — Progress Notes (Signed)
PROGRESS NOTE    [name]  IZT:[id number] DOB: 01/10/1966 DOA: 12/28/2019 PCP: Donna Hy, PA-C   Brief Narrative:  HPI On 12/29/2019 by Dr. 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 Has had some difficulty with ambulating due to her left foot amputation.  Did not hit her head, no LOC, no other complaints.   Interim history  Initially admitted for left hip fracture and underwent surgery on 12/29/2019.  Hospitalization complicated by worsening leukocytosis of up to 35,000.  Patient was noted to have drainage from her left foot amputation site and was started on Ancef on 01/04/2020 for infection.  ID was consulted on 01/05/2020.  MRI on 01/08/2020 revealed an abscess and osteomyelitis.  Patient underwent tunneled dialysis catheter on 01/09/2020.  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  Patient was also started on hemodialysis and currently within the Clip process. Assessment & Plan   Left intertrochanteric fracture -Status post fall -Orthopedic surgery consulted and appreciated status post IM nail on 12/29/2019 -PT consulted recommended CIR versus home with home health and 24-hour care, rolling walker, 3 in 1, wheelchair -Continue pain control  Acute kidney injury on chronic kidney disease, stage V with metabolic acidosis, progressed to ESRD -Patient noted to have volume overload -Failed Lasix trial -Was treated on bicarb infusion -Nephrology consulted and appreciated -Status post tunnel catheter on 01/09/2020 with dialysis starting on 01/10/2020 -Renal navigator assisting with CLIP, looking for HD seat in [place], [place]  Left foot abscess and  osteomyelitis -Status post show part amputation -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 by orthopedic surgery -Initially placed on Ancef however given that patient underwent amputation, discontinued after amputation -Continue pain control -Stump protector  -will need follow up with Dr. Doran Durand in 2 weeks  Constipation/IBS with diarrhea -Likely compounded by narcotics and being bedbound -Lotronex held to prevent further constipation -Continue Dulcolax suppository and bowel regimen with MiraLAX, Colace and senna -was able to have a bowel movement  Mild hyponatremia/hypochloremia -Managed with hemodialysis -improving, continue to monitor   Leukocytosis -Likely reactive  -Continue to monitor CBC -Currently patient afebrile  Diabetes mellitus, type II -Patient uses Lantus and NovoLog at home -Hemoglobin A1c was 6.2 on 12/29/2019 -Continue Lantus, insulin sliding scale, along with 8 units with meals, and CBG monitoring -CBGs appear to be controlled  GERD -Continue PPI  Essential hypertension -Continue Coreg, hydralazine, amlodipine -BP stable  Acute on chronic anemia/anemia of chronic disease -Suspect multifactorial including acute infection, hip fracture and renal disease -Hemoglobin did drop several times and patient thus far has received 4 units of PRBC as well as Feraheme -Hemoglobin 8.4 -Continue to monitor CBC  Physical Deconditioning -PT consulted working with patient although at times she has declined due to pain -Unable to obtain home health agency given place of residence [place], [place]) -Have discussed with inpatient rehab given that initially she was denied by insurance with only hip fracture however now she is status post BKA.  Inpatient rehab following -Had long discussion with patient-she will need to show the ability to work with physical therapy in order to be considered by inpatient rehab.   DVT Prophylaxis Lovenox  Code Status: Full  Family Communication:  None at  bedside  Disposition Plan:  Status is: Inpatient  Remains inpatient appropriate because:Pending clip process and discharge planning, CIR versus home with home health   Dispo: The patient is from: Home              Anticipated d/c is to: TBD              Anticipated d/c date is: > 3 days              Patient currently is not medically stable to d/c.   Consultants Infectious disease Orthopedic surgery Vascular surgery Nephrology  Procedures  Left IM nail 12/29/2019 Left BKA 01/12/2020 Tunneled catheter placement on 01/09/2020  Antibiotics   Anti-infectives (From admission, onward)   Start     Dose/Rate Route Frequency Ordered Stop   01/12/20 0806  vancomycin (VANCOCIN) powder  Status:  Discontinued          As needed 01/12/20 0806 01/12/20 0946   01/10/20 2000  ceFAZolin (ANCEF) IVPB 1 g/50 mL premix  Status:  Discontinued        1 g 100 mL/hr over 30 Minutes Intravenous Every 24 hours 01/10/20 1343 01/14/20 1311   01/10/20 0600  ceFAZolin (ANCEF) IVPB 2g/100 mL premix        2 g 200 mL/hr over 30 Minutes Intravenous On call to O.R. 01/09/20 1952 01/11/20 0559   01/06/20 0400  ceFAZolin (ANCEF) IVPB 2g/100 mL premix  Status:  Discontinued        2 g 200 mL/hr over 30 Minutes Intravenous Every 12 hours 01/05/20 1709 01/10/20 1343   01/05/20 2200  ceFAZolin (ANCEF) IVPB 2g/100 mL premix  Status:  Discontinued        2 g 200 mL/hr over 30 Minutes Intravenous Every 8 hours 01/05/20 1707 01/05/20 1709   01/04/20 1700  ceFAZolin (ANCEF) IVPB 1 g/50 mL premix  Status:  Discontinued        1 g 100 mL/hr over 30 Minutes Intravenous Every 12 hours 01/04/20 1611 01/05/20 1707   12/29/19 1930  ceFAZolin (ANCEF) IVPB 2g/100 mL premix        2 g 200 mL/hr over 30 Minutes Intravenous Every 6 hours 12/29/19 1620 12/30/19 0201   12/29/19 1300  ceFAZolin (ANCEF) IVPB 2g/100 mL premix        2 g 200 mL/hr over 30 Minutes Intravenous On call to O.R. 12/29/19 0113 12/29/19 1325       Subjective:   [name] seen and examined today.  Continues to have left lower extremity pain feels it is somewhat tolerable.  Denies current chest pain or shortness of breath, abdominal pain, nausea or vomiting, diarrhea or constipation, dizziness or headache.   Objective:   Vitals:   01/16/20 1451 01/16/20 1956 01/17/20 0500 01/17/20 0946  BP: (!) 158/58 (!) 138/53 (!) 150/54 (!) 143/56  Pulse: 76 71  68  Resp: 20 18    Temp:  98.8 F (37.1 C) 98.4 F (36.9 C) 98.2 F (36.8 C)  TempSrc:  Oral Oral Oral  SpO2: 100% 97% 99% 97%  Weight:      Height:        Intake/Output Summary (Last 24 hours) at 01/17/2020 1046 Last data filed at 01/17/2020 9741 Gross per 24 hour  Intake 240 ml  Output 2150 ml  Net -1910 ml   Filed Weights   01/16/20 0500 01/16/20 1107 01/16/20 1420  Weight: 93.5 kg 91.2 kg 89.7 kg   Exam  General: Well developed, well  nourished, NAD, appears stated age  65: NCAT, mucous membranes moist.   Cardiovascular: S1 S2 auscultated, 2/6 SEM, RRR  Respiratory: Clear to auscultation bilaterally (anteriorly)  Abdomen: Soft, nontender, nondistended, + bowel sounds  Extremities: warm dry without cyanosis clubbing or edema of the RLE.  L BKA/stump protector  Neuro: AAOx3, nonfocal  Psych: tearful, mildly anxious however appropriate  Data Reviewed: I have personally reviewed following labs and imaging studies  CBC: Recent Labs  Lab 01/11/20 0139 01/12/20 0028 01/13/20 0808 01/16/20 1130 01/17/20 0613  WBC 17.8* 22.5* 20.5* 21.7* 14.4*  HGB 8.7* 8.8* 8.4* 8.4* 8.1*  HCT 27.6* 28.0* 27.7* 27.9* 26.9*  MCV 87.3 87.8 91.1 92.1 91.5  PLT 344 320 337 350 381   Basic Metabolic Panel: Recent Labs  Lab 01/11/20 0139 01/11/20 0139 01/12/20 0028 01/13/20 0807 01/15/20 0300 01/16/20 1130 01/17/20 0613  NA 133*   < > 135 132* 133* 134* 134*  K 3.9   < > 3.9 4.0 4.1 4.1 4.1  CL 97*   < > 100 97* 101 98 99  CO2 24   < > 25 24 23 24 26    GLUCOSE 269*   < > 199* 198* 168* 91 207*  BUN 94*   < > 56* 62* 42* 52* 26*  CREATININE 5.43*   < > 3.98* 4.80* 4.64* 5.11* 3.42*  CALCIUM 8.4*  8.3*   < > 8.4* 8.0* 8.4* 8.4* 8.4*  MG 1.8  --   --   --   --   --   --   PHOS 5.9*  --  4.3 4.8*  --  4.2 3.5   < > = values in this interval not displayed.   GFR: Estimated Creatinine Clearance: 23.4 mL/min (A) (by C-G formula based on SCr of 3.42 mg/dL (H)). Liver Function Tests: Recent Labs  Lab 01/11/20 0139 01/12/20 0028 01/13/20 0807 01/16/20 1130 01/17/20 0613  ALBUMIN 1.8* 1.8* 1.7* 1.7* 1.7*   No results for input(s): LIPASE, AMYLASE in the last 168 hours. No results for input(s): AMMONIA in the last 168 hours. Coagulation Profile: No results for input(s): INR, PROTIME in the last 168 hours. Cardiac Enzymes: No results for input(s): CKTOTAL, CKMB, CKMBINDEX, TROPONINI in the last 168 hours. BNP (last 3 results) No results for input(s): PROBNP in the last 8760 hours. HbA1C: No results for input(s): HGBA1C in the last 72 hours. CBG: Recent Labs  Lab 01/15/20 2123 01/16/20 0730 01/16/20 1637 01/16/20 2224 01/17/20 0740  GLUCAP 218* 141* 151* 190* 183*   Lipid Profile: No results for input(s): CHOL, HDL, LDLCALC, TRIG, CHOLHDL, LDLDIRECT in the last 72 hours. Thyroid Function Tests: No results for input(s): TSH, T4TOTAL, FREET4, T3FREE, THYROIDAB in the last 72 hours. Anemia Panel: No results for input(s): VITAMINB12, FOLATE, FERRITIN, TIBC, IRON, RETICCTPCT in the last 72 hours. Urine analysis:    Component Value Date/Time   COLORURINE YELLOW 03/29/2014 [place] 03/29/2014 1259   LABSPEC 1.020 03/29/2014 1259   PHURINE 7.5 03/29/2014 1259   GLUCOSEU NEGATIVE 03/29/2014 1259   HGBUR TRACE-INTACT (A) 03/29/2014 1259   BILIRUBINUR neg 02/16/2017 1501   KETONESUR NEGATIVE 03/29/2014 1259   PROTEINUR Positive (A) 10/13/2017 1632   UROBILINOGEN 0.2 02/16/2017 1501   UROBILINOGEN 0.2 03/29/2014  1259   NITRITE neg 02/16/2017 1501   NITRITE NEGATIVE 03/29/2014 1259   LEUKOCYTESUR Negative 02/16/2017 1501   Sepsis Labs: @LABRCNTIP (procalcitonin:4,lacticidven:4)  ) Recent Results (from the past 240 hour(s))  Surgical pcr screen  Status: None   Collection Time: 01/09/20  7:57 PM   Specimen: Nasal Mucosa; Nasal Swab  Result Value Ref Range Status   MRSA, PCR NEGATIVE NEGATIVE Final   Staphylococcus aureus NEGATIVE NEGATIVE Final    Comment: (NOTE) The Xpert SA Assay (FDA approved for NASAL specimens in patients 29 years of age and older), is one component of a comprehensive surveillance program. It is not intended to diagnose infection nor to guide or monitor treatment. Performed at [facility], [place] [address] [place], [place] [address]       Radiology Studies: No results found.   Scheduled Meds: . sodium chloride   Intravenous Once  . acetaminophen  650 mg Oral Q6H  . amLODipine  10 mg Oral Daily  . atorvastatin  40 mg Oral q1800  . bisacodyl  10 mg Rectal Once  . carvedilol  25 mg Oral BID WC  . Chlorhexidine Gluconate Cloth  6 each Topical Q0600  . [START ON 01/18/2020] darbepoetin (ARANESP) injection - DIALYSIS  60 mcg Intravenous Q Wed-HD  . dexlansoprazole  60 mg Oral Daily  . docusate sodium  100 mg Oral BID  . DULoxetine  60 mg Oral Daily  . enoxaparin (LOVENOX) injection  30 mg Subcutaneous Q24H  . feeding supplement (NEPRO CARB STEADY)  237 mL Oral BID BM  . hydrALAZINE  100 mg Oral TID  . insulin aspart  0-5 Units Subcutaneous QHS  . insulin aspart  0-9 Units Subcutaneous TID WC  . insulin aspart  8 Units Subcutaneous TID WC  . insulin glargine  38 Units Subcutaneous QHS  . multivitamin  1 tablet Oral QHS  . polyethylene glycol  17 g Oral Daily  . senna  1 tablet Oral BID  . sevelamer carbonate  800 mg Oral TID WC   Continuous Infusions: . sodium chloride Stopped (01/09/20 0759)  . sodium chloride 10 mL/hr at 01/10/20 1510     LOS:  20 days   Time Spent in minutes   30 minutes  Elain Wixon D.O. on 01/17/2020 at 10:46 AM  Between 7am to 7pm - Please see pager noted on [email or web address]  After 7pm go to [email or web address]  And look for the night coverage person covering for me after hours  [facility] Office  [phone]

## 2020-01-17 NOTE — Plan of Care (Signed)
Patient is not compliant with most of her cares, didn't want to be disturbed even during the day. Requested primary nurse to place DO not disturb by her door. Encouraged and explained that she needed to participate with her daily cares and treatment/therapy in order for her to get better.

## 2020-01-17 NOTE — Progress Notes (Signed)
Renal Navigator checked in with [name] who states she is working on referral today and will get back to Navigator.  Alphonzo Cruise, [place] Renal Navigator [phone]

## 2020-01-17 NOTE — Plan of Care (Signed)
?  Problem: Clinical Measurements: ?Goal: Ability to maintain clinical measurements within normal limits will improve ?Outcome: Progressing ?Goal: Will remain free from infection ?Outcome: Progressing ?Goal: Diagnostic test results will improve ?Outcome: Progressing ?  ?

## 2020-01-18 DIAGNOSIS — L97524 Non-pressure chronic ulcer of other part of left foot with necrosis of bone: Secondary | ICD-10-CM | POA: Diagnosis not present

## 2020-01-18 DIAGNOSIS — E1129 Type 2 diabetes mellitus with other diabetic kidney complication: Secondary | ICD-10-CM | POA: Diagnosis not present

## 2020-01-18 DIAGNOSIS — I120 Hypertensive chronic kidney disease with stage 5 chronic kidney disease or end stage renal disease: Secondary | ICD-10-CM | POA: Diagnosis not present

## 2020-01-18 DIAGNOSIS — N185 Chronic kidney disease, stage 5: Secondary | ICD-10-CM | POA: Diagnosis not present

## 2020-01-18 DIAGNOSIS — D62 Acute posthemorrhagic anemia: Secondary | ICD-10-CM | POA: Diagnosis not present

## 2020-01-18 DIAGNOSIS — N179 Acute kidney failure, unspecified: Secondary | ICD-10-CM | POA: Diagnosis not present

## 2020-01-18 DIAGNOSIS — T148XXA Other injury of unspecified body region, initial encounter: Secondary | ICD-10-CM | POA: Diagnosis not present

## 2020-01-18 DIAGNOSIS — D649 Anemia, unspecified: Secondary | ICD-10-CM | POA: Diagnosis not present

## 2020-01-18 DIAGNOSIS — E8770 Fluid overload, unspecified: Secondary | ICD-10-CM | POA: Diagnosis not present

## 2020-01-18 DIAGNOSIS — L02416 Cutaneous abscess of left lower limb: Secondary | ICD-10-CM | POA: Diagnosis not present

## 2020-01-18 DIAGNOSIS — N186 End stage renal disease: Secondary | ICD-10-CM | POA: Diagnosis not present

## 2020-01-18 LAB — CBC
HCT: 25.3 % — ABNORMAL LOW (ref 36.0–46.0)
Hemoglobin: 7.4 g/dL — ABNORMAL LOW (ref 12.0–15.0)
MCH: 27 pg (ref 26.0–34.0)
MCHC: 29.2 g/dL — ABNORMAL LOW (ref 30.0–36.0)
MCV: 92.3 fL (ref 80.0–100.0)
Platelets: 310 10*3/uL (ref 150–400)
RBC: 2.74 MIL/uL — ABNORMAL LOW (ref 3.87–5.11)
RDW: 15.4 % (ref 11.5–15.5)
WBC: 12.7 10*3/uL — ABNORMAL HIGH (ref 4.0–10.5)
nRBC: 0 % (ref 0.0–0.2)

## 2020-01-18 LAB — RENAL FUNCTION PANEL
Albumin: 1.7 g/dL — ABNORMAL LOW (ref 3.5–5.0)
Anion gap: 11 (ref 5–15)
BUN: 37 mg/dL — ABNORMAL HIGH (ref 6–20)
CO2: 23 mmol/L (ref 22–32)
Calcium: 8.3 mg/dL — ABNORMAL LOW (ref 8.9–10.3)
Chloride: 98 mmol/L (ref 98–111)
Creatinine, Ser: 4.17 mg/dL — ABNORMAL HIGH (ref 0.44–1.00)
GFR, Estimated: 12 mL/min — ABNORMAL LOW (ref >60)
Glucose, Bld: 178 mg/dL — ABNORMAL HIGH (ref 70–99)
Phosphorus: 4 mg/dL (ref 2.5–4.6)
Potassium: 4.1 mmol/L (ref 3.5–5.1)
Sodium: 132 mmol/L — ABNORMAL LOW (ref 135–145)

## 2020-01-18 LAB — GLUCOSE, CAPILLARY
Glucose-Capillary: 173 mg/dL — ABNORMAL HIGH (ref 70–99)
Glucose-Capillary: 148 mg/dL — ABNORMAL HIGH (ref 70–99)
Glucose-Capillary: 113 mg/dL — ABNORMAL HIGH (ref 70–99)
Glucose-Capillary: 242 mg/dL — ABNORMAL HIGH (ref 70–99)
Glucose-Capillary: 167 mg/dL — ABNORMAL HIGH (ref 70–99)

## 2020-01-18 MED ORDER — DARBEPOETIN ALFA 100 MCG/0.5ML IJ SOSY
PREFILLED_SYRINGE | INTRAMUSCULAR | Status: AC
  Filled 2020-01-18: qty 0.5

## 2020-01-18 NOTE — Progress Notes (Signed)
Inpatient Rehabilitation Admissions Coordinator  I began insurance authorization with [place] [employer] on 11/16 for a possible CIR admit. I await their determination.  Danne Baxter, RN, MSN Rehab Admissions Coordinator [phone] 01/18/2020 12:01 PM

## 2020-01-18 NOTE — Progress Notes (Signed)
PROGRESS NOTE   [name]  ZOX:[id number]    DOB: [birth year]    DOA: 12/28/2019  PCP: Julian Hy, PA-C   I have briefly reviewed patients previous medical records in [facility].  Chief Complaint  Patient presents with  . 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  Initially admitted for left hip fracture and underwent surgery on 12/29/2019.  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  Nephrology consulted, tunneled HD catheter placed 01/09/2020 and undergoing regular HD.  Consulted orthopedics/Dr. Lyla Glassing again on 11/18 due to reported purulent drainage from left hip surgical site.   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 Rehab team has initiated insurance auth and pending -Continue pain control - Patient and RN reported significant purulent drainage from post op  site for the last 3 days. Patient unwilling to allow local exam this am. I discussed with Dr. Delfino Lovett who will assess and may consider I&D in OR 11/18. - Hold Abx initiation until his follow up or intervention unless she spikes a fever, may have to start empiric IV Abx.  Acute kidney injury on chronic kidney disease, stage V with metabolic acidosis, progressed to ESRD -Patient noted to have volume overload> as evidenced by pitting flank edema -Failed Lasix trial -Was treated on bicarb infusion -Nephrology consulted and following -Status post tunnel catheter on 01/09/2020 with dialysis starting on 01/10/2020 -Renal navigator assisted with clip.  Patient has been accepted at [facility] kidney center on MWF schedule with a seat time of 12:10 PM. - Seen at HD. D/W Dr. Jonnie Finner and discussed re excess volume removal at HD.  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 Doran Durand in 2 weeks  Constipation/IBS with diarrhea -Likely compounded by narcotics and being bedbound -Lotronex held to prevent further constipation -Continue Dulcolax suppository and bowel regimen with MiraLAX, Colace and senna -was able to have a bowel movement  Mild hyponatremia/hypochloremia -Managed with hemodialysis -improving/stable, continue to monitor   Leukocytosis -Likely reactive  -Continue to monitor CBC -Currently patient afebrile. Decreasing. Please see above re Abx Mx.  Diabetes mellitus, type II -Patient uses Lantus and NovoLog at home -Hemoglobin  A1c was 6.2 on 12/29/2019 -Continue Lantus, insulin sliding scale, along with 8 units with meals, and CBG monitoring -CBGs appear to be controlled  GERD -Continue PPI  Essential  hypertension -Continue Coreg, hydralazine, amlodipine -BP stable  Acute on chronic anemia/anemia of chronic disease -Suspect multifactorial including acute infection, hip fracture and renal disease -Hemoglobin did drop several times and patient thus far has received 4 units of PRBC as well as Feraheme -Hemoglobin dropped again to 7.4 today. Follow CBC's closely and transfuse if Hb 7 or less.  Physical Deconditioning -PT consulted working with patient although at times she has declined due to pain -Unable to obtain home health agency given place of residence [place], [place]) -Have discussed with inpatient rehab given that initially she was denied by insurance with only hip fracture however now she is status post BKA.  Inpatient rehab following - Prior [facility] MD had long discussion with patient-she will need to show the ability to work with physical therapy in order to be considered by inpatient rehab.  - awaiting CIR insurance auth  Body mass index is 28.37 kg/m.  Nutritional Status Nutrition Problem: Increased nutrient needs Etiology: post-op healing Signs/Symptoms: estimated needs Interventions: MVI, Nepro shake  Pressure Ulcer:     DVT prophylaxis: enoxaparin (LOVENOX) injection 30 mg Start: 01/13/20 0800 SCDs Start: 01/12/20 1210 SCDs Start: 12/29/19 1621     Code Status: Full Code Family Communication:  Disposition:  Status is: Inpatient  Remains inpatient appropriate because:Inpatient level of care appropriate due to severity of illness   Dispo: The patient is from: Home              Anticipated d/c is to: TBD.?  CIR.              Anticipated d/c date is: > 3 days              Patient currently is not medically stable to d/c.        Consultants:   Infectious disease Orthopedic surgery Vascular surgery Nephrology  Procedures:   Left IM nail 12/29/2019 Left BKA 01/12/2020 Tunneled catheter placement on 01/09/2020 Ongoing HD's  Antimicrobials:     Anti-infectives (From admission, onward)   Start     Dose/Rate Route Frequency Ordered Stop   01/12/20 0806  vancomycin (VANCOCIN) powder  Status:  Discontinued          As needed 01/12/20 0806 01/12/20 0946   01/10/20 2000  ceFAZolin (ANCEF) IVPB 1 g/50 mL premix  Status:  Discontinued        1 g 100 mL/hr over 30 Minutes Intravenous Every 24 hours 01/10/20 1343 01/14/20 1311   01/10/20 0600  ceFAZolin (ANCEF) IVPB 2g/100 mL premix        2 g 200 mL/hr over 30 Minutes Intravenous On call to O.R. 01/09/20 1952 01/11/20 0559   01/06/20 0400  ceFAZolin (ANCEF) IVPB 2g/100 mL premix  Status:  Discontinued        2 g 200 mL/hr over 30 Minutes Intravenous Every 12 hours 01/05/20 1709 01/10/20 1343   01/05/20 2200  ceFAZolin (ANCEF) IVPB 2g/100 mL premix  Status:  Discontinued        2 g 200 mL/hr over 30 Minutes Intravenous Every 8 hours 01/05/20 1707 01/05/20 1709   01/04/20 1700  ceFAZolin (ANCEF) IVPB 1 g/50 mL premix  Status:  Discontinued        1 g 100 mL/hr over 30 Minutes Intravenous Every 12 hours 01/04/20 1611 01/05/20 1707  12/29/19 1930  ceFAZolin (ANCEF) IVPB 2g/100 mL premix        2 g 200 mL/hr over 30 Minutes Intravenous Every 6 hours 12/29/19 1620 12/30/19 0201   12/29/19 1300  ceFAZolin (ANCEF) IVPB 2g/100 mL premix        2 g 200 mL/hr over 30 Minutes Intravenous On call to O.R. 12/29/19 0113 12/29/19 1325        Subjective:  Patient seen this morning across dialysis.  Patient reports 3-day history of "creamy" drainage from left hip surgical wound site.  This was confirmed by patient's RN.  Patient however refused to have the site examined by me in the presence of her female HD RN at bedside.  No pain reported.  Objective:   Vitals:   01/18/20 1030 01/18/20 1100 01/18/20 1130 01/18/20 1345  BP: (!) 158/68 (!) 161/70 (!) 151/71 (!) 158/52  Pulse: 67 68 68 76  Resp:    17  Temp:    98.6 F (37 C)  TempSrc:    Oral  SpO2:    96%  Weight:      Height:         General exam: Young female, moderately built and nourished lying comfortably propped up in bed. Respiratory system: Clear to auscultation. Respiratory effort normal.  Right upper anterior chest TDC site appears clean and dry. Cardiovascular system: S1 & S2 heard, RRR. No JVD, murmurs, rubs, gallops or clicks. No pedal edema. Gastrointestinal system: Abdomen is nondistended, soft and nontender. No organomegaly or masses felt. Normal bowel sounds heard. Central nervous system: Alert and oriented. No focal neurological deficits. Extremities: Symmetric 5 x 5 power.  Left BKA dressing site clean and dry.  Has stump protector in place. Skin: No rashes, lesions or ulcers.  Left hip site dressing clean and dry.  However has significant pitting edema over the left flank above the left hip dressing site with faint redness but no tenderness or increased warmth. Psychiatry: Judgement and insight appear normal. Mood & affect appropriate.     Data Reviewed:   I have personally reviewed following labs and imaging studies   CBC: Recent Labs  Lab 01/16/20 1130 01/17/20 0613 01/18/20 0832  WBC 21.7* 14.4* 12.7*  HGB 8.4* 8.1* 7.4*  HCT 27.9* 26.9* 25.3*  MCV 92.1 91.5 92.3  PLT 350 311 950    Basic Metabolic Panel: Recent Labs  Lab 01/16/20 1130 01/17/20 0613 01/18/20 0605  NA 134* 134* 132*  K 4.1 4.1 4.1  CL 98 99 98  CO2 24 26 23   GLUCOSE 91 207* 178*  BUN 52* 26* 37*  CREATININE 5.11* 3.42* 4.17*  CALCIUM 8.4* 8.4* 8.3*  PHOS 4.2 3.5 4.0    Liver Function Tests: Recent Labs  Lab 01/16/20 1130 01/17/20 0613 01/18/20 0605  ALBUMIN 1.7* 1.7* 1.7*    CBG: Recent Labs  Lab 01/18/20 0645 01/18/20 0748 01/18/20 1230  GLUCAP 173* 148* 113*    Microbiology Studies:   Recent Results (from the past 240 hour(s))  Surgical pcr screen     Status: None   Collection Time: 01/09/20  7:57 PM   Specimen: Nasal Mucosa; Nasal Swab  Result Value Ref Range Status   MRSA, PCR  NEGATIVE NEGATIVE Final   Staphylococcus aureus NEGATIVE NEGATIVE Final    Comment: (NOTE) The Xpert SA Assay (FDA approved for NASAL specimens in patients 43 years of age and older), is one component of a comprehensive surveillance program. It is not intended to diagnose infection nor  to guide or monitor treatment. Performed at [facility], [place] [address] [place], [place] [address]      Radiology Studies:  No results found.   Scheduled Meds:   . sodium chloride   Intravenous Once  . acetaminophen  650 mg Oral Q6H  . amLODipine  10 mg Oral Daily  . atorvastatin  40 mg Oral q1800  . bisacodyl  10 mg Rectal Once  . carvedilol  25 mg Oral BID WC  . Chlorhexidine Gluconate Cloth  6 each Topical Q0600  . Darbepoetin Alfa      . darbepoetin (ARANESP) injection - DIALYSIS  100 mcg Intravenous Q Wed-HD  . dexlansoprazole  60 mg Oral Daily  . docusate sodium  100 mg Oral BID  . DULoxetine  60 mg Oral Daily  . enoxaparin (LOVENOX) injection  30 mg Subcutaneous Q24H  . feeding supplement (NEPRO CARB STEADY)  237 mL Oral BID BM  . hydrALAZINE  100 mg Oral TID  . insulin aspart  0-5 Units Subcutaneous QHS  . insulin aspart  0-9 Units Subcutaneous TID WC  . insulin aspart  8 Units Subcutaneous TID WC  . insulin glargine  38 Units Subcutaneous QHS  . multivitamin  1 tablet Oral QHS  . polyethylene glycol  17 g Oral Daily  . senna  1 tablet Oral BID  . sevelamer carbonate  800 mg Oral TID WC    Continuous Infusions:   . sodium chloride Stopped (01/09/20 0759)  . sodium chloride 10 mL/hr at 01/10/20 1510     LOS: 21 days     Vernell Leep, MD, [place], [facility]    To contact the attending provider between 7A-7P or the covering provider during after hours 7P-7A, please log into the web site [email or web address] and access using universal [place] password for that web site. If you do not have the password, please call the hospital operator.  01/18/2020,  3:57 PM

## 2020-01-18 NOTE — Progress Notes (Signed)
Inpatient Rehabilitation Admissions Coordinator  I have scheduled a peer to peer with Dr. Algis Liming and [facility] Medicaid [employer] MD to discuss the request for CIR approval. We have received an initial denial and I have discussed with patient.  Danne Baxter, RN, MSN Rehab Admissions Coordinator [phone] 01/18/2020 4:56 PM

## 2020-01-18 NOTE — Progress Notes (Addendum)
PT Cancellation Note  Patient Details Name: [name] MRN: [id number] DOB: 1965-11-28   Cancelled Treatment:    Reason Eval/Treat Not Completed: (P) Patient at procedure or test/unavailable (Pt at HD dept, will continue attempts in PM per PT POC as schedule permits.)  PM attempt, pt defer due to signficant LLE pain (pt already medicated), reviewed HEP program for bed-level exercises and encouraged pt to perform TID, pt agreeable.   Kara Pacer Elaine [place] 01/18/2020, 9:44 AM

## 2020-01-18 NOTE — Progress Notes (Addendum)
[facility] Progress Note  Subjective:  Seen in HD unit. [place] UF goal today. Tolerating well, no complaints.   Vitals:   01/18/20 0820 01/18/20 0824 01/18/20 0830 01/18/20 0900  BP: 138/68 (!) 147/67 (!) 152/71 (!) 154/72  Pulse: 65 65 67 67  Resp:      Temp:      TempSrc:      SpO2:      Weight:      Height:        Exam: Gen alert, no distress Chest clear bilat to bases, no rales RRR no MRG Abd soft ntnd no mass or ascites +bs 1+ RLE edema, L BKA in stump protector Neuro is alert, Ox 3 , nf RIJ TDC c/d/i, L AC fossa incision c/d/i, L radial pulse strong and hand is warm; bruit present    Home meds:  - norvasc 5/ coreg 6.25/ asa / lipitor / hydralazine 50 tid  - insulin lantus 50u/ aspart 10u tid  - oxy IR prn/ cymbalta qd  - dexilant/ flexeril prn/ protonix 40     CXR - no acute disease     Last creat's outpatient >>      Aug 2021 - creat 4.9      Dec 23, 2019 - creat 5.2        Assessment/ Plan: 1. AoCKD 5 now ESRD- b/l creat 4.9- 5.2, eGFR 9- 11 from 8/21- 10/21. Pt f/b Dr Posey Pronto. Creat 6 on admission, then mid 7's, IVF's given w/o improvement. S/p TDC and perm access 11/8, 1st HD 11/9, HD#2 11/10, #3 11/12.  No heparin.   CLIP to outpt HD underway. Next HD 11/17.  2. Anemia ckd - Hb 7.4, trending down.  Was 6.3 on admit > got 2u prbc's, Not symptomatic. Tsat low 10%, got 526m dose Feraheme on 11/06. Increase Aranesp 60 to 100 q week on 11/17.  3. L foot stump chronic open wound - possible osteo by plain films.  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 HTN/ vol  - BP's are stable / normal on her 3 home meds 5. DM - on insulin, per pmd 6. L hip fracture - admission diagnosis. sp ORIF on 10/28 per ortho 7. BMM - started binder, phos at goal now. PTH 44.  8. Dispo. CLIP to outpatient HD in progress. [facility] Still need to clairify discharge plans. HH vs SNF vs inpatient rehab.    OLynnda ChildPA-C [facility] 01/18/2020,9:15 AM     Recent Labs  Lab 01/17/20 [phone]/17/21 0605 01/18/20 0832  K 4.1 4.1  --   BUN 26* 37*  --   CREATININE 3.42* 4.17*  --   CALCIUM 8.4* 8.3*  --   PHOS 3.5 4.0  --   HGB 8.1*  --  7.4*   Inpatient medications: . sodium chloride   Intravenous Once  . acetaminophen  650 mg Oral Q6H  . amLODipine  10 mg Oral Daily  . atorvastatin  40 mg Oral q1800  . bisacodyl  10 mg Rectal Once  . carvedilol  25 mg Oral BID WC  . Chlorhexidine Gluconate Cloth  6 each Topical Q0600  . darbepoetin (ARANESP) injection - DIALYSIS  100 mcg Intravenous Q Wed-HD  . dexlansoprazole  60 mg Oral Daily  . docusate sodium  100 mg Oral BID  . DULoxetine  60 mg Oral Daily  . enoxaparin (LOVENOX) injection  30 mg Subcutaneous Q24H  . feeding  supplement (NEPRO CARB STEADY)  237 mL Oral BID BM  . hydrALAZINE  100 mg Oral TID  . insulin aspart  0-5 Units Subcutaneous QHS  . insulin aspart  0-9 Units Subcutaneous TID WC  . insulin aspart  8 Units Subcutaneous TID WC  . insulin glargine  38 Units Subcutaneous QHS  . multivitamin  1 tablet Oral QHS  . polyethylene glycol  17 g Oral Daily  . senna  1 tablet Oral BID  . sevelamer carbonate  800 mg Oral TID WC   . sodium chloride Stopped (01/09/20 0759)  . sodium chloride 10 mL/hr at 01/10/20 1510   sodium chloride, acetaminophen, calcium carbonate, cyclobenzaprine, diphenhydrAMINE, guaiFENesin-dextromethorphan, hydrALAZINE, HYDROmorphone (DILAUDID) injection, lip balm, loratadine, menthol-cetylpyridinium **OR** phenol, Muscle Rub, ondansetron **OR** ondansetron (ZOFRAN) IV, oxyCODONE, oxyCODONE, polyvinyl alcohol, sodium chloride

## 2020-01-18 NOTE — Progress Notes (Signed)
Patient has been accepted at [facility] on a MWF schedule with a seat time of 12:10pm. She needs to arrive to her appointments at 11:50am.  On her first day at the clinic, patient needs to arrive at 11:10am to complete intake paperwork prior to her first treatment. Renal Navigator informed Nephrologist and will meet with patient to inform. Renal Navigator following for discharge.  Alphonzo Cruise, [place] Renal Navigator [phone]

## 2020-01-19 ENCOUNTER — Inpatient Hospital Stay (HOSPITAL_COMMUNITY): Payer: Medicaid Other | Admitting: Certified Registered Nurse Anesthetist

## 2020-01-19 ENCOUNTER — Inpatient Hospital Stay (HOSPITAL_COMMUNITY): Payer: Medicaid Other

## 2020-01-19 ENCOUNTER — Encounter (HOSPITAL_COMMUNITY): Payer: Self-pay | Admitting: Internal Medicine

## 2020-01-19 ENCOUNTER — Encounter (HOSPITAL_COMMUNITY)
Admission: EM | Disposition: A | Discharge status: Rehabilitation Facility | Payer: Self-pay | Source: Home / Self Care | Attending: Internal Medicine

## 2020-01-19 DIAGNOSIS — T8149XA Infection following a procedure, other surgical site, initial encounter: Secondary | ICD-10-CM | POA: Diagnosis not present

## 2020-01-19 DIAGNOSIS — T8142XA Infection following a procedure, deep incisional surgical site, initial encounter: Secondary | ICD-10-CM | POA: Diagnosis not present

## 2020-01-19 DIAGNOSIS — T148XXA Other injury of unspecified body region, initial encounter: Secondary | ICD-10-CM | POA: Diagnosis not present

## 2020-01-19 DIAGNOSIS — L97329 Non-pressure chronic ulcer of left ankle with unspecified severity: Secondary | ICD-10-CM | POA: Diagnosis not present

## 2020-01-19 DIAGNOSIS — M86672 Other chronic osteomyelitis, left ankle and foot: Secondary | ICD-10-CM | POA: Diagnosis not present

## 2020-01-19 DIAGNOSIS — Z89512 Acquired absence of left leg below knee: Secondary | ICD-10-CM | POA: Diagnosis not present

## 2020-01-19 DIAGNOSIS — N185 Chronic kidney disease, stage 5: Secondary | ICD-10-CM | POA: Diagnosis not present

## 2020-01-19 DIAGNOSIS — D62 Acute posthemorrhagic anemia: Secondary | ICD-10-CM | POA: Diagnosis not present

## 2020-01-19 DIAGNOSIS — D259 Leiomyoma of uterus, unspecified: Secondary | ICD-10-CM | POA: Diagnosis not present

## 2020-01-19 DIAGNOSIS — I120 Hypertensive chronic kidney disease with stage 5 chronic kidney disease or end stage renal disease: Secondary | ICD-10-CM | POA: Diagnosis not present

## 2020-01-19 DIAGNOSIS — Z20822 Contact with and (suspected) exposure to covid-19: Secondary | ICD-10-CM | POA: Diagnosis not present

## 2020-01-19 DIAGNOSIS — S72142A Displaced intertrochanteric fracture of left femur, initial encounter for closed fracture: Secondary | ICD-10-CM | POA: Diagnosis not present

## 2020-01-19 DIAGNOSIS — L97524 Non-pressure chronic ulcer of other part of left foot with necrosis of bone: Secondary | ICD-10-CM | POA: Diagnosis not present

## 2020-01-19 DIAGNOSIS — L02416 Cutaneous abscess of left lower limb: Secondary | ICD-10-CM | POA: Diagnosis not present

## 2020-01-19 DIAGNOSIS — L03116 Cellulitis of left lower limb: Secondary | ICD-10-CM | POA: Diagnosis not present

## 2020-01-19 DIAGNOSIS — E1129 Type 2 diabetes mellitus with other diabetic kidney complication: Secondary | ICD-10-CM | POA: Diagnosis not present

## 2020-01-19 DIAGNOSIS — E871 Hypo-osmolality and hyponatremia: Secondary | ICD-10-CM | POA: Diagnosis not present

## 2020-01-19 DIAGNOSIS — D649 Anemia, unspecified: Secondary | ICD-10-CM | POA: Diagnosis not present

## 2020-01-19 DIAGNOSIS — N186 End stage renal disease: Secondary | ICD-10-CM | POA: Diagnosis not present

## 2020-01-19 DIAGNOSIS — E872 Acidosis: Secondary | ICD-10-CM | POA: Diagnosis not present

## 2020-01-19 DIAGNOSIS — E8770 Fluid overload, unspecified: Secondary | ICD-10-CM | POA: Diagnosis not present

## 2020-01-19 DIAGNOSIS — N179 Acute kidney failure, unspecified: Secondary | ICD-10-CM | POA: Diagnosis not present

## 2020-01-19 HISTORY — PX: I & D EXTREMITY: SHX5045

## 2020-01-19 LAB — GLUCOSE, CAPILLARY
Glucose-Capillary: 75 mg/dL (ref 70–99)
Glucose-Capillary: 141 mg/dL — ABNORMAL HIGH (ref 70–99)
Glucose-Capillary: 54 mg/dL — ABNORMAL LOW (ref 70–99)
Glucose-Capillary: 79 mg/dL (ref 70–99)
Glucose-Capillary: 71 mg/dL (ref 70–99)
Glucose-Capillary: 83 mg/dL (ref 70–99)
Glucose-Capillary: 285 mg/dL — ABNORMAL HIGH (ref 70–99)

## 2020-01-19 LAB — CBC
HCT: 25.3 % — ABNORMAL LOW (ref 36.0–46.0)
HCT: 26.9 % — ABNORMAL LOW (ref 36.0–46.0)
Hemoglobin: 7.7 g/dL — ABNORMAL LOW (ref 12.0–15.0)
Hemoglobin: 7.9 g/dL — ABNORMAL LOW (ref 12.0–15.0)
MCH: 27.9 pg (ref 26.0–34.0)
MCH: 27.3 pg (ref 26.0–34.0)
MCHC: 30.4 g/dL (ref 30.0–36.0)
MCHC: 29.4 g/dL — ABNORMAL LOW (ref 30.0–36.0)
MCV: 91.7 fL (ref 80.0–100.0)
MCV: 93.1 fL (ref 80.0–100.0)
Platelets: 296 10*3/uL (ref 150–400)
Platelets: 283 10*3/uL (ref 150–400)
RBC: 2.76 MIL/uL — ABNORMAL LOW (ref 3.87–5.11)
RBC: 2.89 MIL/uL — ABNORMAL LOW (ref 3.87–5.11)
RDW: 15.3 % (ref 11.5–15.5)
RDW: 15.3 % (ref 11.5–15.5)
WBC: 10 10*3/uL (ref 4.0–10.5)
WBC: 15.5 10*3/uL — ABNORMAL HIGH (ref 4.0–10.5)
nRBC: 0 % (ref 0.0–0.2)
nRBC: 0 % (ref 0.0–0.2)

## 2020-01-19 LAB — URINALYSIS, ROUTINE W REFLEX MICROSCOPIC
Bilirubin Urine: NEGATIVE
Glucose, UA: 50 mg/dL — AB
Hgb urine dipstick: NEGATIVE
Ketones, ur: NEGATIVE mg/dL
Nitrite: NEGATIVE
Protein, ur: 100 mg/dL — AB
Specific Gravity, Urine: 1.01 (ref 1.005–1.030)
pH: 6 (ref 5.0–8.0)

## 2020-01-19 LAB — CREATININE, SERUM
Creatinine, Ser: 3.41 mg/dL — ABNORMAL HIGH (ref 0.44–1.00)
Creatinine, Ser: 4 mg/dL — ABNORMAL HIGH (ref 0.44–1.00)
GFR, Estimated: 16 mL/min — ABNORMAL LOW (ref >60)
GFR, Estimated: 13 mL/min — ABNORMAL LOW (ref >60)

## 2020-01-19 SURGERY — IRRIGATION AND DEBRIDEMENT EXTREMITY
Anesthesia: General | Laterality: Left

## 2020-01-19 MED ORDER — BUPIVACAINE-EPINEPHRINE 0.5% -1:200000 IJ SOLN
INTRAMUSCULAR | Status: AC
  Filled 2020-01-19: qty 1

## 2020-01-19 MED ORDER — 0.9 % SODIUM CHLORIDE (POUR BTL) OPTIME
TOPICAL | Status: DC | PRN
  Administered 2020-01-19: 1000 mL

## 2020-01-19 MED ORDER — ROCURONIUM BROMIDE 100 MG/10ML IV SOLN
INTRAVENOUS | Status: DC | PRN
  Administered 2020-01-19: 50 mg via INTRAVENOUS
  Administered 2020-01-19: 10 mg via INTRAVENOUS

## 2020-01-19 MED ORDER — ONDANSETRON HCL 4 MG/2ML IJ SOLN: 4.0000 mg | Freq: Once | INTRAMUSCULAR | Status: DC | PRN

## 2020-01-19 MED ORDER — SUCCINYLCHOLINE CHLORIDE 200 MG/10ML IV SOSY
PREFILLED_SYRINGE | INTRAVENOUS | Status: AC
  Filled 2020-01-19: qty 10

## 2020-01-19 MED ORDER — PHENYLEPHRINE 40 MCG/ML (10ML) SYRINGE FOR IV PUSH (FOR BLOOD PRESSURE SUPPORT)
PREFILLED_SYRINGE | INTRAVENOUS | Status: DC | PRN
  Administered 2020-01-19: 120 ug via INTRAVENOUS

## 2020-01-19 MED ORDER — SUGAMMADEX SODIUM 200 MG/2ML IV SOLN
INTRAVENOUS | Status: DC | PRN
  Administered 2020-01-19: 200 mg via INTRAVENOUS

## 2020-01-19 MED ORDER — DEXAMETHASONE SODIUM PHOSPHATE 10 MG/ML IJ SOLN
INTRAMUSCULAR | Status: AC
  Filled 2020-01-19: qty 1

## 2020-01-19 MED ORDER — EPHEDRINE 5 MG/ML INJ
INTRAVENOUS | Status: AC
  Filled 2020-01-19: qty 10

## 2020-01-19 MED ORDER — ONDANSETRON HCL 4 MG/2ML IJ SOLN
INTRAMUSCULAR | Status: AC
  Filled 2020-01-19: qty 2

## 2020-01-19 MED ORDER — DEXTROSE 50 % IV SOLN
25.0000 g | INTRAVENOUS | Status: AC
  Administered 2020-01-19: 25 g via INTRAVENOUS

## 2020-01-19 MED ORDER — FENTANYL CITRATE (PF) 100 MCG/2ML IJ SOLN
INTRAMUSCULAR | Status: AC
  Filled 2020-01-19: qty 2

## 2020-01-19 MED ORDER — OXYCODONE HCL 5 MG PO TABS: 5.0000 mg | ORAL_TABLET | Freq: Once | ORAL | Status: DC | PRN

## 2020-01-19 MED ORDER — DEXTROSE 50 % IV SOLN
INTRAVENOUS | Status: AC
  Filled 2020-01-19: qty 50

## 2020-01-19 MED ORDER — PIPERACILLIN-TAZOBACTAM IN DEX 2-0.25 GM/50ML IV SOLN
2.2500 g | Freq: Four times a day (QID) | INTRAVENOUS | Status: DC
  Administered 2020-01-19 – 2020-01-20 (×4): 2.25 g via INTRAVENOUS
  Filled 2020-01-19 (×7): qty 50

## 2020-01-19 MED ORDER — OXYCODONE HCL 5 MG/5ML PO SOLN: 5.0000 mg | Freq: Once | ORAL | Status: DC | PRN

## 2020-01-19 MED ORDER — FENTANYL CITRATE (PF) 100 MCG/2ML IJ SOLN: 25.0000 ug | INTRAMUSCULAR | Status: DC | PRN

## 2020-01-19 MED ORDER — POVIDONE-IODINE 10 % EX SWAB
2.0000 "application " | Freq: Once | CUTANEOUS | Status: AC
  Administered 2020-01-19: 2 via TOPICAL

## 2020-01-19 MED ORDER — CHLORHEXIDINE GLUCONATE 0.12 % MT SOLN
OROMUCOSAL | Status: AC
  Administered 2020-01-19: 15 mL
  Filled 2020-01-19: qty 15

## 2020-01-19 MED ORDER — VANCOMYCIN VARIABLE DOSE PER UNSTABLE RENAL FUNCTION (PHARMACIST DOSING): Status: DC

## 2020-01-19 MED ORDER — FENTANYL CITRATE (PF) 250 MCG/5ML IJ SOLN
INTRAMUSCULAR | Status: AC
  Filled 2020-01-19: qty 5

## 2020-01-19 MED ORDER — FENTANYL CITRATE (PF) 100 MCG/2ML IJ SOLN
25.0000 ug | INTRAMUSCULAR | Status: DC | PRN
  Administered 2020-01-19: 50 ug via INTRAVENOUS
  Administered 2020-01-19: 25 ug via INTRAVENOUS
  Administered 2020-01-19: 50 ug via INTRAVENOUS
  Administered 2020-01-19: 25 ug via INTRAVENOUS

## 2020-01-19 MED ORDER — PROMETHAZINE HCL 25 MG/ML IJ SOLN: 6.2500 mg | INTRAMUSCULAR | Status: DC | PRN

## 2020-01-19 MED ORDER — PROPOFOL 10 MG/ML IV BOLUS
INTRAVENOUS | Status: DC | PRN
  Administered 2020-01-19: 150 mg via INTRAVENOUS

## 2020-01-19 MED ORDER — ONDANSETRON HCL 4 MG/2ML IJ SOLN: 4.0000 mg | Freq: Four times a day (QID) | INTRAMUSCULAR | Status: DC | PRN

## 2020-01-19 MED ORDER — ROCURONIUM BROMIDE 10 MG/ML (PF) SYRINGE
PREFILLED_SYRINGE | INTRAVENOUS | Status: AC
  Filled 2020-01-19: qty 10

## 2020-01-19 MED ORDER — PROPOFOL 10 MG/ML IV BOLUS
INTRAVENOUS | Status: AC
  Filled 2020-01-19: qty 20

## 2020-01-19 MED ORDER — METOCLOPRAMIDE HCL 5 MG PO TABS: 5.0000 mg | ORAL_TABLET | Freq: Three times a day (TID) | ORAL | Status: DC | PRN

## 2020-01-19 MED ORDER — EPHEDRINE SULFATE-NACL 50-0.9 MG/10ML-% IV SOSY
PREFILLED_SYRINGE | INTRAVENOUS | Status: DC | PRN
  Administered 2020-01-19 (×3): 5 mg via INTRAVENOUS

## 2020-01-19 MED ORDER — FENTANYL CITRATE (PF) 250 MCG/5ML IJ SOLN
INTRAMUSCULAR | Status: DC | PRN
  Administered 2020-01-19: 50 ug via INTRAVENOUS
  Administered 2020-01-19: 25 ug via INTRAVENOUS
  Administered 2020-01-19: 100 ug via INTRAVENOUS
  Administered 2020-01-19: 25 ug via INTRAVENOUS

## 2020-01-19 MED ORDER — MEPERIDINE HCL 25 MG/ML IJ SOLN: 6.2500 mg | INTRAMUSCULAR | Status: DC | PRN

## 2020-01-19 MED ORDER — 0.9 % SODIUM CHLORIDE (POUR BTL) OPTIME
TOPICAL | Status: DC | PRN
  Administered 2020-01-19 (×2): 3000 mL

## 2020-01-19 MED ORDER — CEFAZOLIN SODIUM-DEXTROSE 2-4 GM/100ML-% IV SOLN
2.0000 g | INTRAVENOUS | Status: AC
  Administered 2020-01-19: 2 g via INTRAVENOUS
  Filled 2020-01-19: qty 100

## 2020-01-19 MED ORDER — ONDANSETRON HCL 4 MG/2ML IJ SOLN
INTRAMUSCULAR | Status: DC | PRN
  Administered 2020-01-19: 4 mg via INTRAVENOUS

## 2020-01-19 MED ORDER — PHENYLEPHRINE 40 MCG/ML (10ML) SYRINGE FOR IV PUSH (FOR BLOOD PRESSURE SUPPORT)
PREFILLED_SYRINGE | INTRAVENOUS | Status: AC
  Filled 2020-01-19: qty 10

## 2020-01-19 MED ORDER — ENOXAPARIN SODIUM 30 MG/0.3ML ~~LOC~~ SOLN
30.0000 mg | SUBCUTANEOUS | Status: DC
  Administered 2020-01-20 – 2020-01-23 (×4): 30 mg via SUBCUTANEOUS
  Filled 2020-01-19 (×5): qty 0.3

## 2020-01-19 MED ORDER — IRRISEPT - 450ML BOTTLE WITH 0.05% CHG IN STERILE WATER, USP 99.95% OPTIME
TOPICAL | Status: DC | PRN
  Administered 2020-01-19: 450 mL

## 2020-01-19 MED ORDER — METOCLOPRAMIDE HCL 5 MG/ML IJ SOLN: 5.0000 mg | Freq: Three times a day (TID) | INTRAMUSCULAR | Status: DC | PRN

## 2020-01-19 MED ORDER — ACETAMINOPHEN 160 MG/5ML PO SOLN: 325.0000 mg | ORAL | Status: DC | PRN

## 2020-01-19 MED ORDER — VANCOMYCIN HCL 2000 MG/400ML IV SOLN
2000.0000 mg | Freq: Once | INTRAVENOUS | Status: AC
  Administered 2020-01-19: 2000 mg via INTRAVENOUS
  Filled 2020-01-19: qty 400

## 2020-01-19 MED ORDER — PROSOURCE PLUS PO LIQD
30.0000 mL | Freq: Two times a day (BID) | ORAL | Status: DC
  Administered 2020-01-20 – 2020-01-23 (×7): 30 mL via ORAL
  Filled 2020-01-19 (×7): qty 30

## 2020-01-19 MED ORDER — LIDOCAINE 2% (20 MG/ML) 5 ML SYRINGE
INTRAMUSCULAR | Status: AC
  Filled 2020-01-19: qty 5

## 2020-01-19 MED ORDER — ACETAMINOPHEN 325 MG PO TABS: 325.0000 mg | ORAL_TABLET | ORAL | Status: DC | PRN

## 2020-01-19 MED ORDER — PHENYLEPHRINE HCL-NACL 10-0.9 MG/250ML-% IV SOLN
INTRAVENOUS | Status: DC | PRN
  Administered 2020-01-19: 35 ug/min via INTRAVENOUS

## 2020-01-19 MED ORDER — DOCUSATE SODIUM 100 MG PO CAPS
100.0000 mg | ORAL_CAPSULE | Freq: Two times a day (BID) | ORAL | Status: DC
  Administered 2020-01-19 – 2020-01-21 (×4): 100 mg via ORAL
  Filled 2020-01-19 (×7): qty 1

## 2020-01-19 MED ORDER — CHLORHEXIDINE GLUCONATE 4 % EX LIQD: 60.0000 mL | Freq: Once | CUTANEOUS | Status: DC

## 2020-01-19 MED ORDER — ONDANSETRON HCL 4 MG PO TABS: 4.0000 mg | ORAL_TABLET | Freq: Four times a day (QID) | ORAL | Status: DC | PRN

## 2020-01-19 SURGICAL SUPPLY — 83 items
ADH SKN CLS APL DERMABOND .7 (GAUZE/BANDAGES/DRESSINGS) ×2
BAG DECANTER FOR FLEXI CONT (MISCELLANEOUS) ×2 IMPLANT
BLADE SAW SGTL 18X1.27X75 (BLADE) ×1 IMPLANT
BNDG ELASTIC 6X5.8 VLCR STR LF (GAUZE/BANDAGES/DRESSINGS) ×2 IMPLANT
CNTNR URN SCR LID CUP LEK RST (MISCELLANEOUS) IMPLANT
CONT SPEC 4OZ STRL OR WHT (MISCELLANEOUS) ×2
COVER SURGICAL LIGHT HANDLE (MISCELLANEOUS) ×3 IMPLANT
COVER WAND RF STERILE (DRAPES) ×2 IMPLANT
DERMABOND ADVANCED (GAUZE/BANDAGES/DRESSINGS) ×2
DERMABOND ADVANCED .7 DNX12 (GAUZE/BANDAGES/DRESSINGS) ×2 IMPLANT
DRAIN CHANNEL 10M FLAT 3/4 FLT (DRAIN) ×1 IMPLANT
DRAPE HIP W/POCKET STRL (MISCELLANEOUS) ×3 IMPLANT
DRAPE INCISE IOBAN 85X60 (DRAPES) ×2 IMPLANT
DRAPE ORTHO SPLIT 77X108 STRL (DRAPES) ×2
DRAPE POUCH INSTRU U-SHP 10X18 (DRAPES) ×3 IMPLANT
DRAPE SURG 17X11 SM STRL (DRAPES) ×2 IMPLANT
DRAPE SURG ORHT 6 SPLT 77X108 (DRAPES) IMPLANT
DRAPE U-SHAPE 47X51 STRL (DRAPES) ×2 IMPLANT
DRSG AQUACEL AG ADV 3.5X10 (GAUZE/BANDAGES/DRESSINGS) ×1 IMPLANT
DRSG AQUACEL AG ADV 3.5X14 (GAUZE/BANDAGES/DRESSINGS) ×1 IMPLANT
DRSG PAD ABDOMINAL 8X10 ST (GAUZE/BANDAGES/DRESSINGS) ×2 IMPLANT
DURAPREP 26ML APPLICATOR (WOUND CARE) ×2 IMPLANT
ELECT BLADE TIP CTD 4 INCH (ELECTRODE) ×2 IMPLANT
ELECT REM PT RETURN 9FT ADLT (ELECTROSURGICAL) ×2
ELECTRODE REM PT RTRN 9FT ADLT (ELECTROSURGICAL) ×1 IMPLANT
EVACUATOR SILICONE 100CC (DRAIN) ×2 IMPLANT
FACESHIELD WRAPAROUND (MASK) ×4 IMPLANT
FACESHIELD WRAPAROUND OR TEAM (MASK) IMPLANT
GAUZE SPONGE 2X2 8PLY STRL LF (GAUZE/BANDAGES/DRESSINGS) ×1 IMPLANT
GLOVE BIO SURGEON STRL SZ8.5 (GLOVE) ×3 IMPLANT
GLOVE BIOGEL M 7.0 STRL (GLOVE) ×1 IMPLANT
GLOVE BIOGEL PI IND STRL 7.5 (GLOVE) ×1 IMPLANT
GLOVE BIOGEL PI IND STRL 8.5 (GLOVE) ×1 IMPLANT
GLOVE BIOGEL PI INDICATOR 7.5 (GLOVE) ×2
GLOVE BIOGEL PI INDICATOR 8.5 (GLOVE) ×2
GLOVE SS N UNI LF 6.5 STRL (GLOVE) ×1 IMPLANT
GLOVE SURG SS PI 6.5 STRL IVOR (GLOVE) ×2 IMPLANT
GLOVE SURG SS PI 7.0 STRL IVOR (GLOVE) ×2 IMPLANT
GLOVE SURG SS PI 7.5 STRL IVOR (GLOVE) ×2 IMPLANT
GLOVE SURG SS PI 8.5 STRL IVOR (GLOVE) ×2
GLOVE SURG SS PI 8.5 STRL STRW (GLOVE) IMPLANT
GLOVE SURG UNDER POLY LF SZ6 (GLOVE) ×1 IMPLANT
GOWN STRL REUS W/ TWL LRG LVL3 (GOWN DISPOSABLE) IMPLANT
GOWN STRL REUS W/ TWL XL LVL3 (GOWN DISPOSABLE) ×1 IMPLANT
GOWN STRL REUS W/TWL 2XL LVL3 (GOWN DISPOSABLE) ×6 IMPLANT
GOWN STRL REUS W/TWL LRG LVL3 (GOWN DISPOSABLE) ×4
GOWN STRL REUS W/TWL XL LVL3 (GOWN DISPOSABLE) ×2
HANDPIECE INTERPULSE COAX TIP (DISPOSABLE) ×2
HOOD PEEL AWAY FLYTE STAYCOOL (MISCELLANEOUS) ×4 IMPLANT
IV NS 1000ML (IV SOLUTION)
IV NS 1000ML BAXH (IV SOLUTION) IMPLANT
JET LAVAGE IRRISEPT WOUND (IRRIGATION / IRRIGATOR) ×2
KIT BASIN OR (CUSTOM PROCEDURE TRAY) ×2 IMPLANT
LAVAGE JET IRRISEPT WOUND (IRRIGATION / IRRIGATOR) IMPLANT
MANIFOLD NEPTUNE II (INSTRUMENTS) ×2 IMPLANT
NS IRRIG 1000ML POUR BTL (IV SOLUTION) ×2 IMPLANT
PACK TOTAL JOINT (CUSTOM PROCEDURE TRAY) ×2 IMPLANT
PADDING CAST COTTON 6X4 STRL (CAST SUPPLIES) ×3 IMPLANT
SEALER BIPOLAR AQUA 6.0 (INSTRUMENTS) IMPLANT
SET HNDPC FAN SPRY TIP SCT (DISPOSABLE) ×1 IMPLANT
SPONGE GAUZE 2X2 STER 10/PKG (GAUZE/BANDAGES/DRESSINGS) ×1
SUCTION FRAZIER HANDLE 10FR (MISCELLANEOUS) ×2
SUCTION TUBE FRAZIER 10FR DISP (MISCELLANEOUS) ×1 IMPLANT
SUT ETHIBOND NAB CT1 #1 30IN (SUTURE) ×2 IMPLANT
SUT ETHILON 2 0 FS 18 (SUTURE) ×1 IMPLANT
SUT ETHILON 2 0 PSLX (SUTURE) ×2 IMPLANT
SUT MNCRL AB 3-0 PS2 27 (SUTURE) ×2 IMPLANT
SUT MON AB 2-0 CT1 36 (SUTURE) ×3 IMPLANT
SUT PDS AB 1 CTX 36 (SUTURE) ×1 IMPLANT
SUT VIC AB 1 CT1 27 (SUTURE) ×6
SUT VIC AB 1 CT1 27XBRD ANBCTR (SUTURE) ×3 IMPLANT
SUT VIC AB 2-0 CT1 27 (SUTURE) ×4
SUT VIC AB 2-0 CT1 TAPERPNT 27 (SUTURE) ×2 IMPLANT
SUT VLOC 180 0 24IN GS25 (SUTURE) ×2 IMPLANT
SWAB COLLECTION DEVICE MRSA (MISCELLANEOUS) ×1 IMPLANT
SWAB CULTURE ESWAB REG 1ML (MISCELLANEOUS) ×1 IMPLANT
SYR 50ML LL SCALE MARK (SYRINGE) ×2 IMPLANT
TAPE CLOTH SURG 4X10 WHT LF (GAUZE/BANDAGES/DRESSINGS) ×1 IMPLANT
TOWEL GREEN STERILE (TOWEL DISPOSABLE) ×4 IMPLANT
TRAY CATH 16FR W/PLASTIC CATH (SET/KITS/TRAYS/PACK) IMPLANT
TRAY FOL W/BAG SLVR 16FR STRL (SET/KITS/TRAYS/PACK) IMPLANT
TRAY FOLEY MTR SLVR 16FR STAT (SET/KITS/TRAYS/PACK) ×1 IMPLANT
TRAY FOLEY W/BAG SLVR 16FR LF (SET/KITS/TRAYS/PACK) ×2

## 2020-01-19 NOTE — Consult Note (Signed)
ORTHOPAEDIC CONSULTATION  REQUESTING PHYSICIAN: Modena Jansky, MD  PCP:  Julian Hy, PA-C  Chief Complaint: Left hip drainage  HPI: [name] is a 54 y.o. female who complains of drainage from the incisions on her left hip. 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  Nursing noted a large amount of purulent drainage over the last three days from the left hip incision.  Past Medical History:  Diagnosis Date  . Anxiety 2002  . Chest tightness   . Depression 2001  . Diabetes type 1, uncontrolled ([place])    at age 53  . Diabetic neuropathy ([place])   . Essential hypertension 2015  . GERD (gastroesophageal reflux disease)    about age of 54  . Nausea and vomiting in adult   . Stroke ([place])   . Urinary frequency   . Yeast vaginitis    Past Surgical History:  Procedure Laterality Date  . ACHILLES TENDON SURGERY Left 03/10/2013   Procedure: LEFT CHOPART AMPUTATION/ LEFT TENDON ACHILLES [place];  Surgeon: Wylene Simmer, MD;  Location: [place];  Service: Orthopedics;  Laterality: Left;  . AMPUTATION Left 06/15/2012   Procedure: AMPUTATION DIGIT;  Surgeon: Meredith Pel, MD;  Location: [place];  Service: Orthopedics;  Laterality: Left;  Left great toe revision amputation  . AMPUTATION Left 01/12/2020   Procedure: AMPUTATION BELOW KNEE;  Surgeon: Wylene Simmer, MD;  Location: [place];  Service: Orthopedics;  Laterality: Left;  . AV FISTULA PLACEMENT Left 01/09/2020   Procedure: LEFT UPPER ARM ARTERIOVENOUS (AV) FISTULA CREATION;  Surgeon: Cherre Robins, MD;  Location: [place];  Service: Vascular;  Laterality: Left;  . ENDOMETRIAL ABLATION    . INSERTION OF DIALYSIS CATHETER Right 01/09/2020   Procedure:  INSERTION OF DIALYSIS CATHETER;  Surgeon: Cherre Robins, MD;  Location: [place];  Service: Vascular;  Laterality: Right;  . INTRAMEDULLARY (IM) NAIL INTERTROCHANTERIC Left 12/29/2019   Procedure: INTRAMEDULLARY (IM) NAIL INTERTROCHANTRIC;  Surgeon: Rod Can, MD;  Location: [facility];  Service: Orthopedics;  Laterality: Left;  . IR ANGIOGRAM EXTREMITY LEFT  12/17/2018  . IR FEM POP ART ATHERECT INC PTA MOD SED  12/17/2018  . IR RADIOLOGIST EVAL & MGMT  11/30/2018  . IR RADIOLOGIST EVAL & MGMT  12/09/2018  . IR RADIOLOGIST EVAL & MGMT  02/08/2019  . IR US GUIDE VASC ACCESS RIGHT  12/17/2018  . LAPAROSCOPIC CHOLECYSTECTOMY  01/30/2015   [facility]    Social History   Socioeconomic History  . Marital status: Divorced    Spouse name: Not on file  . Number of children: 1  . Years of education: Not on file  . Highest education level: Not on file  Occupational History    Employer: [employer]  Tobacco Use  . Smoking status: Light Tobacco Smoker    Packs/day: 0.25    Years: 22.00    Pack years: 5.50    Types: Cigarettes  . Smokeless tobacco: Never Used  Vaping Use  . Vaping Use: Never used  Substance and Sexual Activity  . Alcohol use: Yes    Alcohol/week: 0.0 standard drinks    Comment: sociial  . Drug use: No  . Sexual activity: Yes    Partners:  Male  Other Topics Concern  . Not on file  Social History Narrative   Lives at home with her daughter   Right handed   Caffeine: 3 cups daily   Social Determinants of Health   Financial Resource Strain:   . Difficulty of Paying Living Expenses: Not on file  Food Insecurity:   . Worried About Programme researcher, broadcasting/film/video in the Last Year: Not on file  . Ran Out of Food in the Last Year: Not on file  Transportation Needs:   . Lack of Transportation (Medical): Not on file  . Lack of Transportation (Non-Medical): Not on file  Physical Activity:   . Days of Exercise per Week: Not on file  . Minutes of Exercise per Session: Not on file   Stress:   . Feeling of Stress : Not on file  Social Connections:   . Frequency of Communication with Friends and Family: Not on file  . Frequency of Social Gatherings with Friends and Family: Not on file  . Attends Religious Services: Not on file  . Active Member of Clubs or Organizations: Not on file  . Attends Banker Meetings: Not on file  . Marital Status: Not on file   Family History  Problem Relation Age of Onset  . Diabetes Mother   . Heart attack Mother   . Heart disease Mother        before age 41  . Hypertension Mother   . Hyperlipidemia Mother   . Diabetes Father   . Heart attack Father   . Heart disease Father        before age 34  . Diabetes Sister   . Hypertension Sister    Allergies  Allergen Reactions  . Lidocaine Itching  . Trazodone Swelling  . Latex Rash   Prior to Admission medications   Medication Sig Start Date End Date Taking? Authorizing Provider  acetaminophen (TYLENOL) 500 MG tablet Take 1,000 mg by mouth every 6 (six) hours as needed for mild pain, fever or headache.   Yes [provider]  alosetron (LOTRONEX) 1 MG tablet Take 1 mg by mouth 2 (two) times daily. 12/19/19  Yes [provider]  amLODipine (NORVASC) 5 MG tablet Take 1 tablet (5 mg total) by mouth daily. 10/31/18  Yes Fulp, Cammie, MD  aspirin EC 81 MG EC tablet Take 1 tablet (81 mg total) by mouth daily. 04/11/17  Yes Zannie Cove, MD  atorvastatin (LIPITOR) 40 MG tablet TAKE 1 TABLET (40 MG TOTAL) BY MOUTH DAILY AT 6 PM. 02/22/19  Yes Fulp, Cammie, MD  carvedilol (COREG) 6.25 MG tablet Take 6.25 mg by mouth 2 (two) times daily. 09/03/19  Yes [provider]  CINNAMON PO Take 1 tablet by mouth daily.   Yes [provider]  cyclobenzaprine (FLEXERIL) 10 MG tablet TAKE 1 TABLET AT BEDTIME AS NEEDED FOR MUSCLE SPASM Patient taking differently: Take 10 mg by mouth at bedtime as needed for muscle spasms.  11/16/18  Yes Fulp, Cammie, MD   DEXILANT 60 MG capsule Take 60 mg by mouth daily. 06/09/17  Yes [provider]  DULoxetine (CYMBALTA) 60 MG capsule TAKE 1 CAPSULE BY MOUTH EVERY DAY. Please make PCP appointment. Patient taking differently: Take 60 mg by mouth daily.  03/23/19  Yes Fulp, Cammie, MD  hydrALAZINE (APRESOLINE) 50 MG tablet TAKE 1 TABLET BY MOUTH THREE TIMES A DAY Patient taking differently: Take 50 mg by mouth 3 (three) times daily.  09/13/19  Yes  Fulp, Cammie, MD  insulin aspart (NOVOLOG FLEXPEN) 100 UNIT/ML FlexPen Inject 10 Units into the skin 3 (three) times daily with meals. 01/04/19  Yes Fulp, Cammie, MD  LANTUS SOLOSTAR 100 UNIT/ML Solostar Pen INJECT [place] DAY Patient taking differently: Inject 20 Units into the skin daily.  01/31/19  Yes Fulp, Cammie, MD  MAGNESIUM PO Take 1 tablet by mouth daily.   Yes [provider]  Oxycodone HCl 10 MG TABS Take 10 mg by mouth 3 (three) times daily as needed (pain).  11/17/19  Yes [provider]  aspirin (ASPIRIN CHILDRENS) 81 MG chewable tablet Chew 1 tablet (81 mg total) by mouth 2 (two) times daily with a meal. 01/02/20 02/16/20  Cherlynn June B, PA  Blood Glucose Monitoring Suppl (ACCU-CHEK AVIVA PLUS) w/Device KIT CHECK BLOOD SUGARS THREE TIMES A DAY.  DX:250.03 10/06/17   Fulp, Cammie, MD  calcium citrate-vitamin D 500-400 MG-UNIT chewable tablet Chew 1 tablet by mouth 2 (two) times daily. Patient not taking: Reported on 12/28/2019 05/09/14   Boykin Nearing, MD  glucose blood (ACCU-CHEK AVIVA PLUS) test strip USE TO CHECK BLOOD SUGAR BEFORE MEALS AND AT BEDTIME dx code E10.65 10/06/17   Fulp, Cammie, MD  HYDROcodone-acetaminophen (NORCO/VICODIN) 5-325 MG tablet Take 1-2 tablets by mouth every 6 (six) hours as needed for moderate pain. 01/02/20   Cherlynn June B, PA  Insulin Pen Needle (TRUEPLUS PEN NEEDLES) 32G X 4 MM MISC Use to inject insulin as directed. 12/01/18   Antony Blackbird, MD   DG HIP UNILAT WITH PELVIS 2-3  VIEWS LEFT  Result Date: 01/19/2020 CLINICAL DATA:  Wound drainage following surgery for a femur fracture on 12/29/2019. Status post left below-the-knee amputation on 01/12/2020. EXAM: DG HIP (WITH OR WITHOUT PELVIS) 2-3V LEFT COMPARISON:  12/29/2019. FINDINGS: Intramedullary rod and compression screw fixation of the previously demonstrated comminuted left intertrochanteric fracture. There is soft tissue gas at the fracture site and in the medial soft tissues of the thigh. No bone destruction or periosteal reaction seen. No additional fractures and no dislocation. Small calcified uterine fibroid. IMPRESSION: Soft tissue gas in the medial soft tissues of the thigh and at the left intertrochanteric fracture site, suspicious for infection with a gas-forming organism. Electronically Signed   By: Claudie Revering M.D.   On: 01/19/2020 09:38    Positive ROS: All other systems have been reviewed and were otherwise negative with the exception of those mentioned in the HPI and as above.  Physical Exam: General: Alert, no acute distress Cardiovascular: No pedal edema Respiratory: No cyanosis, no use of accessory musculature GI: No organomegaly, abdomen is soft and non-tender Skin: No lesions in the area of chief complaint Neurologic: Sensation intact distally Psychiatric: Patient is competent for consent with normal mood and affect Lymphatic: No axillary or cervical lymphadenopathy  MUSCULOSKELETAL: Copious amount of drainage noted coming from superior left hip incision. ABD pad over incision was soaked through  Assessment: Left hip drainage  Plan: Left hip drainage: Plan for incision and drainage this afternoon with Dr. Lyla Glassing. NPO. IV antibiotics.     Dorothyann Peng, PA [phone]    01/19/2020 10:44 AM

## 2020-01-19 NOTE — Progress Notes (Signed)
Pharmacy Antibiotic Note  [name] is a 54 y.o. female admitted on 12/28/2019 with wound infection.  Pharmacy has been consulted for Vanc and Zosyn dosing. Patient has progressed to ESRD, but not on a scheduled dialysis schedule at this time  Plan: Zosyn 2.25 gm IV q6hr Vanc 2000 mg IV x 1 today, then vanc variable dosing based on dialysis  Monitor renal function, clinical status, cultures and vanc levels as needed  Height: 5\' 10"  (177.8 cm) Weight:  (90.7kg) IBW/kg (Calculated) : 68.5  Temp (24hrs), Avg:98.6 F (37 C), Min:98.3 F (36.8 C), Max:99.1 F (37.3 C)  Recent Labs  Lab 01/13/20 0807 01/13/20 0808 01/15/20 0300 01/16/20 1130 01/17/20 0613 01/18/20 0605 01/18/20 0832  WBC  --  20.5*  --  21.7* 14.4*  --  12.7*  CREATININE 4.80*  --  4.64* 5.11* 3.42* 4.17*  --     Estimated Creatinine Clearance: 19.2 mL/min (A) (by C-G formula based on SCr of 4.17 mg/dL (H)).    Allergies  Allergen Reactions  . Lidocaine Itching  . Trazodone Swelling  . Latex Rash    Antimicrobials this admission: Zosyn 11/18 >>  Vanc 11/18 >>  Thank you for allowing pharmacy to be a part of this patient's care.  Alanda Slim, PharmD, [facility] Clinical Pharmacist Please see AMION for all Pharmacists' Contact Phone Numbers 01/19/2020, 7:58 AM

## 2020-01-19 NOTE — Progress Notes (Addendum)
PROGRESS NOTE   [name]  OEV:[id number]    DOB: [birth year]    DOA: 12/28/2019  PCP: Julian Hy, PA-C   I have briefly reviewed patients previous medical records in [facility].  Chief Complaint  Patient presents with  . 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  Initially admitted for left hip fracture and underwent surgery on 12/29/2019.  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  Nephrology consulted, tunneled HD catheter placed 01/09/2020 and undergoing regular HD.  Consulted orthopedics/Dr. Lyla Glassing again on 11/18 due to reported purulent drainage from left hip surgical site.  S/p I&D of left hip abscess in OR on 11/18 also Foley catheter with pus in OR.   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 Rehab team has initiated insurance auth and pending -Continue  pain control - Patient and RN reported significant purulent drainage from post op site for the last 3 days.  -Orthopedic follow-up appreciated.  S/p I&D of left hip abscess on 11/18.  Post procedure, I discussed with Dr. Lyla Glassing.  He has empirically started her on IV Zosyn and vancomycin pending culture results.  He feels that given her immunocompromise status, she may need to be on long-term antibiotics and recommends ID consultation once culture results are back.  Surgical drain in place.  Acute kidney injury on chronic kidney disease, stage V with metabolic acidosis, progressed to ESRD -Patient noted to have volume overload> as evidenced by pitting flank edema -Failed Lasix trial -Was treated on bicarb infusion -Nephrology consulted and following -Status post tunnel catheter on 01/09/2020 with dialysis starting on 01/10/2020 -Renal navigator assisted with clip.  Patient has been accepted at [facility] on MWF schedule with a seat time of 12:10 PM. -Management per nephrology.  HD on MWF schedule.  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 Doran Durand in 2 weeks  Constipation/IBS with diarrhea -Likely compounded by narcotics and being bedbound -Lotronex held to prevent further constipation -Continue Dulcolax suppository and bowel regimen with MiraLAX, Colace and senna -was able to have a bowel movement  Mild hyponatremia/hypochloremia -Managed with hemodialysis -improving/stable, continue to monitor  Leukocytosis -Likely reactive  -Continue to monitor CBC -Currently patient afebrile. Decreasing. Please see above re Abx Mx.  Diabetes mellitus, type II -Patient uses Lantus and NovoLog at home -Hemoglobin A1c  was 6.2 on 12/29/2019 -Continue Lantus, insulin sliding scale, along with 8 units with meals, and CBG monitoring -Had a hypoglycemic episode this morning with CBG of 54, resuscitate with IV D50.  Likely due to n.p.o. for procedure.  Now that procedure is done, monitor closely while on diet.  If has recurrent hypoglycemia, may need to cut back on her insulins.  GERD -Continue PPI  Essential hypertension -Continue Coreg, hydralazine, amlodipine -BP stable  Acute on chronic anemia/anemia of chronic disease -Suspect multifactorial including acute infection, hip fracture and renal disease -Hemoglobin did drop several times and patient thus far has received 4 units of PRBC as well as Feraheme -Hemoglobin stable in the mid 7 g range since yesterday.  Follow CBC again at HD tomorrow. Follow CBC's closely and transfuse if Hb 7 or less.  Physical Deconditioning -PT consulted working with patient although at times she has declined due to pain -Unable to obtain home health agency given place of residence [place], [place]) -Have discussed with inpatient rehab given that initially she was denied by insurance with only hip fracture however now she is status post BKA.   -Patient's insurance denied CIR admission.  I discussed with CIR coordinator on 11/17.  I am scheduled to discuss with insurance in a peer to peer conversation on 11/18 at 11 AM to try and overturn the denial.  I discussed this in detail with patient this morning and recommended SNF as backup option and patient is agreeable.  Suspected UTI: Foley catheter placed in OR on 11/18 and Dr. Lyla Glassing noted purulent urine, sent for culture and sensitivity.  Empirically started on IV Zosyn.  Body mass index is 28.69 kg/m.  Nutritional Status Nutrition Problem: Increased nutrient needs Etiology: post-op healing Signs/Symptoms: estimated needs Interventions: MVI, Nepro shake  Pressure Ulcer:     DVT prophylaxis: enoxaparin (LOVENOX)  injection 30 mg Start: 01/13/20 0800 SCDs Start: 01/12/20 1210 SCDs Start: 12/29/19 1621     Code Status: Full Code Family Communication: None at bedside. Disposition:  Status is: Inpatient  Remains inpatient appropriate because:Inpatient level of care appropriate due to severity of illness   Dispo: The patient is from: Home              Anticipated d/c is to: TBD.?  CIR.              Anticipated d/c date is: > 3 days              Patient currently is not medically stable to d/c.        Consultants:   Infectious disease Orthopedic surgery Vascular surgery Nephrology  Procedures:   Left IM nail 12/29/2019 Left BKA 01/12/2020 Tunneled catheter placement on 01/09/2020 Ongoing HD's S/p I&D of left hip abscess in OR on 11/18.  Antimicrobials:    Anti-infectives (From admission, onward)   Start     Dose/Rate Route Frequency Ordered Stop   01/19/20 1230  ceFAZolin (ANCEF) IVPB 2g/100 mL premix        2 g 200 mL/hr over 30 Minutes Intravenous On call to O.R. 01/19/20 1212 01/19/20 1408   01/19/20 0845  [MAR Hold]  piperacillin-tazobactam (ZOSYN) IVPB 2.25 g        (MAR Hold since Thu 01/19/2020 at 1242.Hold Reason: Transfer to a Procedural area.)  2.25 g 100 mL/hr over 30 Minutes Intravenous Every 6 hours 01/19/20 0750     01/19/20 0845  vancomycin (VANCOREADY) IVPB 2000 mg/400 mL        2,000 mg 200 mL/hr over 120 Minutes Intravenous  Once 01/19/20 0752 01/19/20 1231   01/19/20 0752  [MAR Hold]  vancomycin variable dose per unstable renal function (pharmacist dosing)        (MAR Hold since Thu 01/19/2020 at 1242.Hold Reason: Transfer to a Procedural area.)    Does not apply See admin instructions 01/19/20 0752     01/12/20 0806  vancomycin (VANCOCIN) powder  Status:  Discontinued          As needed 01/12/20 0806 01/12/20 0946   01/10/20 2000  ceFAZolin (ANCEF) IVPB 1 g/50 mL premix  Status:  Discontinued        1 g 100 mL/hr over 30 Minutes Intravenous Every 24 hours  01/10/20 1343 01/14/20 1311   01/10/20 0600  ceFAZolin (ANCEF) IVPB 2g/100 mL premix        2 g 200 mL/hr over 30 Minutes Intravenous On call to O.R. 01/09/20 1952 01/11/20 0559   01/06/20 0400  ceFAZolin (ANCEF) IVPB 2g/100 mL premix  Status:  Discontinued        2 g 200 mL/hr over 30 Minutes Intravenous Every 12 hours 01/05/20 1709 01/10/20 1343   01/05/20 2200  ceFAZolin (ANCEF) IVPB 2g/100 mL premix  Status:  Discontinued        2 g 200 mL/hr over 30 Minutes Intravenous Every 8 hours 01/05/20 1707 01/05/20 1709   01/04/20 1700  ceFAZolin (ANCEF) IVPB 1 g/50 mL premix  Status:  Discontinued        1 g 100 mL/hr over 30 Minutes Intravenous Every 12 hours 01/04/20 1611 01/05/20 1707   12/29/19 1930  ceFAZolin (ANCEF) IVPB 2g/100 mL premix        2 g 200 mL/hr over 30 Minutes Intravenous Every 6 hours 12/29/19 1620 12/30/19 0201   12/29/19 1300  ceFAZolin (ANCEF) IVPB 2g/100 mL premix        2 g 200 mL/hr over 30 Minutes Intravenous On call to O.R. 12/29/19 0113 12/29/19 1325        Subjective:  Patient seen this morning prior to procedure.  Denied any specific complaints.  No pain reported.  Allowed examination of left hip in the presence of her female RN at bedside.  Objective:   Vitals:   01/19/20 1617 01/19/20 1630 01/19/20 1632 01/19/20 1647  BP: (!) 124/58  121/73 (!) 121/57  Pulse: (!) 56 (!) 58 (!) 58 (!) 59  Resp: 15 (!) 21 17 (!) 22  Temp:    (!) 97.1 F (36.2 C)  TempSrc:      SpO2: 97% 98% 100% 95%  Weight:      Height:        General exam: Young female, moderately built and nourished lying comfortably propped up in bed. Respiratory system: Clear to auscultation. Respiratory effort normal.  Right upper anterior chest TDC site appears clean and dry. Cardiovascular system: S1 & S2 heard, RRR. No JVD, murmurs, rubs, gallops or clicks. No pedal edema. Gastrointestinal system: Abdomen is nondistended, soft and nontender. No organomegaly or masses felt. Normal bowel  sounds heard. Central nervous system: Alert and oriented. No focal neurological deficits. Extremities: Symmetric 5 x 5 power.  Left BKA dressing site clean and dry.  Has stump protector in place. Skin: No rashes, lesions or ulcers.  Left  hip site dressing was taken down, her bed was soaked with drainage.  Significant pitting edema and induration around the left hip incision site.  Staples still in place.  No active drainage noted.  Faint erythema +.  No tenderness.  Psychiatry: Judgement and insight appear normal. Mood & affect appropriate.     Data Reviewed:   I have personally reviewed following labs and imaging studies   CBC: Recent Labs  Lab 01/17/20 0613 01/18/20 0832 01/19/20 0746  WBC 14.4* 12.7* 10.0  HGB 8.1* 7.4* 7.7*  HCT 26.9* 25.3* 25.3*  MCV 91.5 92.3 91.7  PLT 311 310 834    Basic Metabolic Panel: Recent Labs  Lab 01/16/20 1130 01/16/20 1130 01/17/20 0613 01/18/20 0605 01/19/20 0746  NA 134*  --  134* 132*  --   K 4.1  --  4.1 4.1  --   CL 98  --  99 98  --   CO2 24  --  26 23  --   GLUCOSE 91  --  207* 178*  --   BUN 52*  --  26* 37*  --   CREATININE 5.11*   < > 3.42* 4.17* 3.41*  CALCIUM 8.4*  --  8.4* 8.3*  --   PHOS 4.2  --  3.5 4.0  --    < > = values in this interval not displayed.    Liver Function Tests: Recent Labs  Lab 01/16/20 1130 01/17/20 0613 01/18/20 0605  ALBUMIN 1.7* 1.7* 1.7*    CBG: Recent Labs  Lab 01/19/20 1219 01/19/20 1448 01/19/20 1555  GLUCAP 141* 79 71    Microbiology Studies:   Recent Results (from the past 240 hour(s))  Surgical pcr screen     Status: None   Collection Time: 01/09/20  7:57 PM   Specimen: Nasal Mucosa; Nasal Swab  Result Value Ref Range Status   MRSA, PCR NEGATIVE NEGATIVE Final   Staphylococcus aureus NEGATIVE NEGATIVE Final    Comment: (NOTE) The Xpert SA Assay (FDA approved for NASAL specimens in patients 17 years of age and older), is one component of a comprehensive surveillance  program. It is not intended to diagnose infection nor to guide or monitor treatment. Performed at [facility], [place] [address], [place], [place] [address]      Radiology Studies:  DG HIP UNILAT WITH PELVIS 2-3 VIEWS LEFT  Result Date: 01/19/2020 CLINICAL DATA:  Wound drainage following surgery for a femur fracture on 12/29/2019. Status post left below-the-knee amputation on 01/12/2020. EXAM: DG HIP (WITH OR WITHOUT PELVIS) 2-3V LEFT COMPARISON:  12/29/2019. FINDINGS: Intramedullary rod and compression screw fixation of the previously demonstrated comminuted left intertrochanteric fracture. There is soft tissue gas at the fracture site and in the medial soft tissues of the thigh. No bone destruction or periosteal reaction seen. No additional fractures and no dislocation. Small calcified uterine fibroid. IMPRESSION: Soft tissue gas in the medial soft tissues of the thigh and at the left intertrochanteric fracture site, suspicious for infection with a gas-forming organism. Electronically Signed   By: Claudie Revering M.D.   On: 01/19/2020 09:38     Scheduled Meds:   . [START ON 01/20/2020] (feeding supplement) PROSource Plus  30 mL Oral BID BM  . [MAR Hold] acetaminophen  650 mg Oral Q6H  . [MAR Hold] amLODipine  10 mg Oral Daily  . [MAR Hold] atorvastatin  40 mg Oral q1800  . [MAR Hold] carvedilol  25 mg Oral BID WC  . chlorhexidine  60 mL Topical Once  . [MAR Hold] Chlorhexidine Gluconate Cloth  6 each Topical Q0600  . [MAR Hold] darbepoetin (ARANESP) injection - DIALYSIS  100 mcg Intravenous Q Wed-HD  . [MAR Hold] dexlansoprazole  60 mg Oral Daily  . dextrose      . [MAR Hold] docusate sodium  100 mg Oral BID  . [MAR Hold] DULoxetine  60 mg Oral Daily  . [MAR Hold] enoxaparin (LOVENOX) injection  30 mg Subcutaneous Q24H  . fentaNYL      . fentaNYL      . [MAR Hold] hydrALAZINE  100 mg Oral TID  . [MAR Hold] insulin aspart  0-5 Units Subcutaneous QHS  . [MAR Hold] insulin aspart   0-9 Units Subcutaneous TID WC  . [MAR Hold] insulin aspart  8 Units Subcutaneous TID WC  . [MAR Hold] insulin glargine  38 Units Subcutaneous QHS  . [MAR Hold] multivitamin  1 tablet Oral QHS  . [MAR Hold] polyethylene glycol  17 g Oral Daily  . [MAR Hold] senna  1 tablet Oral BID  . [MAR Hold] sevelamer carbonate  800 mg Oral TID WC  . [MAR Hold] vancomycin variable dose per unstable renal function (pharmacist dosing)   Does not apply See admin instructions    Continuous Infusions:   . [MAR Hold] sodium chloride Stopped (01/09/20 0759)  . sodium chloride 10 mL/hr at 01/10/20 1510  . [MAR Hold] piperacillin-tazobactam (ZOSYN)  IV 2.25 g (01/19/20 0946)     LOS: 22 days     Vernell Leep, MD, [place], [facility]. [facility]    To contact the attending provider between 7A-7P or the covering provider during after hours 7P-7A, please log into the web site [email or web address] and access using universal [place] password for that web site. If you do not have the password, please call the hospital operator.  01/19/2020, 5:12 PM

## 2020-01-19 NOTE — Anesthesia Procedure Notes (Signed)
Procedure Name: Intubation Date/Time: 01/19/2020 1:55 PM Performed by: Janene Harvey, CRNA Pre-anesthesia Checklist: Patient identified, Emergency Drugs available, Suction available and Patient being monitored Patient Re-evaluated:Patient Re-evaluated prior to induction Oxygen Delivery Method: Circle system utilized Preoxygenation: Pre-oxygenation with 100% oxygen Induction Type: IV induction Ventilation: Mask ventilation without difficulty Laryngoscope Size: Mac and 4 Grade View: Grade I Tube type: Oral Tube size: 7.0 mm Number of attempts: 1 Airway Equipment and Method: Stylet and Oral airway Placement Confirmation: ETT inserted through vocal cords under direct vision,  positive ETCO2 and breath sounds checked- equal and bilateral Secured at: 21 cm Tube secured with: Tape Dental Injury: Teeth and Oropharynx as per pre-operative assessment

## 2020-01-19 NOTE — Progress Notes (Signed)
Nutrition Follow-up  DOCUMENTATION CODES:   Not applicable  INTERVENTION:   Once diet is resumed:  -D/c Nepro Shake po BID, each supplement provides 425 kcal and 19 grams protein -30 ml Prosource Plus BID, each supplement provides 100 kcals and 15 grams protein -Continue renal MVI daily  NUTRITION DIAGNOSIS:   Increased nutrient needs related to post-op healing as evidenced by estimated needs.  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 She presented to the ED from home via EMS after falling onto her L hip on concrete.  10/28- s/pIntramedullary fixation,Leftfemur. 11/8- s/pPROCEDURE: 1.Tunneled dialysis catheter placement (right internal jugular, 19 cm catheter) 2. Left upper extremity brachiocephalic AV fistula creation 11/11- s/p Procedure(s):Left below-knee amputation 11/18- s/p I&D of lt hip  Reviewed I/O's: -4 L x 24 hours and -10 L since 01/05/20  UOP: 2 L x 24 hours  Pt unavailable at time of attempted visit.   Pt currently NPO for surgery. Pt'sintake has improved; noted meal completion 60-100%. Pt is refusing Nepro supplements.  Medications reviewed and include aranesp, colace, renvela, senokot, and 0.9% sodium chloride infusion @ 10 ml/hr.   Per chart review, possible plan to admit to CIR once medically stable. Eventual plan to d/c home with daughter.   Labs reviewed: CBGS: 54-141 (inpatient orders for glycemic control are 0-5 units insulin aspart daily at bedtime, 0-9 units insulin aspart TID with meals, 8 units insulin aspart TID with meals, and 38 units insulin glargine dailay  at bedtime).   Diet Order:   Diet Order            Diet NPO time specified Except for: Sips with Meds  Diet effective now                 EDUCATION NEEDS:   Education needs have been addressed  Skin:  Skin Assessment: Skin Integrity Issues: Skin Integrity Issues:: Other (Comment) Incisions: closed lt hip, s/p lt BKA Other: -  Last BM:  01/17/20  Height:   Ht Readings from Last 1 Encounters:  01/19/20 5\' 10"  (1.778 m)    Weight:   Wt Readings from Last 1 Encounters:  01/19/20 90.7 kg   BMI:  Body mass index is 28.69 kg/m.  Estimated Nutritional Needs:   Kcal:  2100-2300  Protein:  105-120 grams  Fluid:  1000 ml + UOP    Loistine Chance, RD, LDN, [place] Registered Dietitian II Certified Diabetes Care and Education Specialist Please refer to [facility] for RD and/or RD on-call/weekend/after hours pager

## 2020-01-19 NOTE — Anesthesia Preprocedure Evaluation (Addendum)
Anesthesia Evaluation  Patient identified by MRN, date of birth, ID band Patient awake    Reviewed: Allergy & Precautions, NPO status , Patient's Chart, lab work & pertinent test results, reviewed documented beta blocker date and time   History of Anesthesia Complications Negative for: history of anesthetic complications  Airway Mallampati: II  TM Distance: >3 FB Neck ROM: Full    Dental  (+) Dental Advisory Given   Pulmonary Current Smoker and Patient abstained from smoking.,    Pulmonary exam normal        Cardiovascular hypertension, Pt. on medications and Pt. on home beta blockers + Peripheral Vascular Disease  Normal cardiovascular exam   '19 Carotid US - 1-39% b/l ICAS  '19 TTE - moderate LVH. EF 60% to 65%. Grade 2 diastolic dysfunction. No valvulopathy    Neuro/Psych  Headaches, PSYCHIATRIC DISORDERS Anxiety Depression CVA, No Residual Symptoms    GI/Hepatic Neg liver ROS, GERD  Controlled and Medicated,  Endo/Other  diabetes, Type 1, Insulin Dependent Hyponatremia, Na 132   Renal/GU Dialysis and CRFRenal disease     Musculoskeletal negative musculoskeletal ROS (+)   Abdominal   Peds  Hematology  (+) anemia ,   Anesthesia Other Findings Covid test negative 10/27 at time of admission    Reproductive/Obstetrics                           Anesthesia Physical Anesthesia Plan  ASA: III  Anesthesia Plan: General   Post-op Pain Management:    Induction: Intravenous  PONV Risk Score and Plan: 3 and Treatment may vary due to age or medical condition, Ondansetron and Midazolam  Airway Management Planned: Oral ETT  Additional Equipment: None  Intra-op Plan:   Post-operative Plan: Extubation in OR  Informed Consent: I have reviewed the patients History and Physical, chart, labs and discussed the procedure including the risks, benefits and alternatives for the proposed  anesthesia with the patient or authorized representative who has indicated his/her understanding and acceptance.     Dental advisory given  Plan Discussed with: CRNA and Anesthesiologist  Anesthesia Plan Comments:       Anesthesia Quick Evaluation

## 2020-01-19 NOTE — Interval H&P Note (Signed)
History and Physical Interval Note:  01/19/2020 1:33 PM  [name]  has presented today for surgery, with the diagnosis of Infection Left hip wound..  The various methods of treatment have been discussed with the patient and family. After consideration of risks, benefits and other options for treatment, the patient has consented to  Procedure(s): IRRIGATION AND DEBRIDEMENT Left Hip (Left) as a surgical intervention.  The patient's history has been reviewed, patient examined, no change in status, stable for surgery.  I have reviewed the patient's chart and labs.  Questions were answered to the patient's satisfaction.    Patient has subsequently developed seropurulent drainage from the left hip proximal incision over the past couple of days. Recommend I&D L hip. Discussed R/B/A with the patient. Risks include but are not limited to bleeding, infection, damage to vessels/nerves, reoperation, worsening of infection, among others. She understands and wishes to proceed.    Hilton Cork Imonie Tuch

## 2020-01-19 NOTE — Progress Notes (Addendum)
[facility] Progress Note  Subjective:  HD yesterday w 3L UF  May to to OR today for washout of hip wound   Vitals:   01/18/20 1154 01/18/20 1345 01/18/20 2108 01/19/20 0300  BP: (!) 147/64 (!) 158/52 (!) 101/47 (!) 118/45  Pulse: 71 76 64 66  Resp: '15 17 17 18  ' Temp: 98.3 F (36.8 C) 98.6 F (37 C) 99.1 F (37.3 C) 98.6 F (37 C)  TempSrc: Oral Oral Oral Oral  SpO2:  96% 98% 99%  Weight:      Height:        Exam: Gen alert, no distress Chest clear bilat to bases, no rales RRR no MRG Abd soft ntnd no mass or ascites +bs 1+ RLE edema, L BKA in stump protector Neuro is alert, Ox 3 , nf RIJ TDC c/d/i, L AC fossa incision c/d/i, L radial pulse strong and hand is warm; bruit present    Home meds:  - norvasc 5/ coreg 6.25/ asa / lipitor / hydralazine 50 tid  - insulin lantus 50u/ aspart 10u tid  - oxy IR prn/ cymbalta qd  - dexilant/ flexeril prn/ protonix 40     CXR - no acute disease     Last creat's outpatient >>      Aug 2021 - creat 4.9      Dec 23, 2019 - creat 5.2        Assessment/ Plan: 1. AoCKD 5 now ESRD- b/l creat 4.9- 5.2, eGFR 9- 11 from 8/21- 10/21. Pt f/b Dr Posey Pronto. Creat 6 on admission, then mid 7's, IVF's given w/o improvement. S/p TDC and perm access 11/8, 1st HD 11/9, HD#2 11/10, #3 11/12.  Cont MWF schedule  No heparin. Next HD 11/19 2. Anemia ckd - Hb 7.7, trending down.  Was 6.3 on admit > got 2u prbc's, Not symptomatic. Tsat low 10%, got 578m dose Feraheme on 11/06. Increase Aranesp 60 to 100 q week on 11/17.  3. 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 HTN/ vol  - BP's are stable / normal on her 3 home meds. Some volume on exam, UF for volume removal  5. DM - on insulin, per pmd 6. L hip fracture - admission diagnosis. sp ORIF on 10/28 per ortho. For washout of wound today.  7. BMM - started binder, phos at goal now. PTH 44.  8. [facility].  Still need to clairify discharge plans. HH vs SNF vs inpatient rehab.    OLynnda ChildPA-C [facility] 01/19/2020,11:20 AM  Pt seen, examined and agree w A/P as above.  Rob Ruel Dimmick  MD 01/19/2020, 5:06 PM       Recent Labs  Lab 01/17/20 0613 01/17/20 0613 01/18/20 0605 01/18/20 0832 01/19/20 0746  K 4.1  --  4.1  --   --   BUN 26*  --  37*  --   --   CREATININE 3.42*   < > 4.17*  --  3.41*  CALCIUM 8.4*  --  8.3*  --   --   PHOS 3.5  --  4.0  --   --   HGB 8.1*   < >  --  7.4* 7.7*   < > = values in this interval not displayed.   Inpatient medications: . acetaminophen  650 mg Oral Q6H  . amLODipine  10 mg Oral Daily  .  atorvastatin  40 mg Oral q1800  . carvedilol  25 mg Oral BID WC  . Chlorhexidine Gluconate Cloth  6 each Topical Q0600  . darbepoetin (ARANESP) injection - DIALYSIS  100 mcg Intravenous Q Wed-HD  . dexlansoprazole  60 mg Oral Daily  . docusate sodium  100 mg Oral BID  . DULoxetine  60 mg Oral Daily  . enoxaparin (LOVENOX) injection  30 mg Subcutaneous Q24H  . feeding supplement (NEPRO CARB STEADY)  237 mL Oral BID BM  . hydrALAZINE  100 mg Oral TID  . insulin aspart  0-5 Units Subcutaneous QHS  . insulin aspart  0-9 Units Subcutaneous TID WC  . insulin aspart  8 Units Subcutaneous TID WC  . insulin glargine  38 Units Subcutaneous QHS  . multivitamin  1 tablet Oral QHS  . polyethylene glycol  17 g Oral Daily  . senna  1 tablet Oral BID  . sevelamer carbonate  800 mg Oral TID WC  . vancomycin variable dose per unstable renal function (pharmacist dosing)   Does not apply See admin instructions   . sodium chloride Stopped (01/09/20 0759)  . sodium chloride 10 mL/hr at 01/10/20 1510  . piperacillin-tazobactam (ZOSYN)  IV 2.25 g (01/19/20 0946)  . vancomycin 2,000 mg (01/19/20 1031)   sodium chloride, acetaminophen, calcium carbonate, cyclobenzaprine, diphenhydrAMINE, guaiFENesin-dextromethorphan, hydrALAZINE, HYDROmorphone  (DILAUDID) injection, lip balm, loratadine, menthol-cetylpyridinium **OR** phenol, Muscle Rub, ondansetron **OR** ondansetron (ZOFRAN) IV, oxyCODONE, oxyCODONE, polyvinyl alcohol, sodium chloride

## 2020-01-19 NOTE — Op Note (Signed)
OPERATIVE REPORT   01/19/2020  3:27 PM  PATIENT:  [name]   SURGEON:  Bertram Savin, MD  ASSISTANT:  Cherlynn June, PA-C.   PREOPERATIVE DIAGNOSIS: Surgical site infection left hip.  POSTOPERATIVE DIAGNOSIS:  Same.  PROCEDURE: Irrigation and drainage of left hip abscess. Excisional debridement of skin, subcutaneous tissue, and muscle left hip.  ANESTHESIA:   GETA.  ANTIBIOTICS: 2 g Ancef.  IMPLANTS: None.  SPECIMENS: Left hip fluid collection for aerobic and anaerobic culture.  DRAINS: 10 mm JP drain in subcutaneous tissue x1.  COMPLICATIONS: None.  DISPOSITION: Stable to PACU.  SURGICAL INDICATIONS:  [name] is a 54 y.o. female with multiple medical problems who underwent intramedullary fixation of comminuted left intertrochanteric fracture by myself on 12/29/2019.  She subsequently required left BKA by Dr. Doran Durand and has begun dialysis.  She recently began having drainage from her proximal left hip incision, and there has been documented refusal of hygiene care.  I became aware of this situation yesterday evening and discussed irrigation and debridement with the patient.  The risks, benefits, and alternatives were discussed with the patient preoperatively including but not limited to the risks of infection, bleeding, nerve / blood vessel injury, cardiopulmonary complications, the need for repeat surgery, failure to cure the infection, among others, and the patient was willing to proceed.  PROCEDURE IN DETAIL: The patient was identified in the holding area using 2 identifiers.  The surgical site was marked by myself.  She was taken to the operating room, placed supine on the operating room table.  General anesthesia was induced.  A Foley catheter was placed.  About 50 cc of grossly purulent urine was returned, and we sent this for urinalysis with culture.  She was flipped to the right lateral decubitus position.  She was secured to the table with a hip  positioner.  All bony prominences were well-padded.  Axillary roll was placed.  The left hip was prepped and draped in the normal sterile surgical fashion.  Timeout was called, verifying site and site of surgery.  She did receive IV antibiotics within 60 minutes of beginning the procedure.    I removed all of her left hip staples.  The distal 2 incisions were completely normal.  The proximal incision had surrounding erythema.  Using digital dissection, I opened the proximal incision.  There was a malodorous seropurulent fluid collection.  Representative swab was taken from within the wound and sent for culture.  Using a #10 blade I performed an excisional debridement of a small amount of necrotic skin and subcutaneous tissue.  I extended the skin incision proximally and distally.  I created full-thickness skin flaps.  I carried my dissection digitally down to the fascia which had a small rent in it.  All suture material was removed.   I performed an excisional debridement of a small amount of necrotic muscle tissue using a rongeur.  Overall, there was not a lot of significant necrotic tissue.  The infection was clearly worse in the subcutaneous tissues. All the wounds were examined and checked for tunneling, which was not present.  I then copiously irrigated the wound with 9 L of normal saline using pulse lavage.  I then irrigated the wound with irrisept solution and then reirrigated with saline.  I closed the fascia with #1 PDS suture.  The distal 2 incisions were closed with 2-0 Monocryl for the deep dermal layer and 2-0 nylon vertical mattress sutures for the skin.  I placed  a 10 mm flat JP drain in the proximal incision which I brought out through a separate stab wound in line with the incision.  Proximal incision was then closed with 2-0 Monocryl for the deep dermal layer and 2-0 nylon vertical mattress sutures for the skin.  I sewed in the drain with 2-0 nylon suture.  Sterile dressing was applied with  Adaptic, 4 x 4's, ABDs, and tape.  Patient was then flipped supine.  The stump shrinker dressing was reapplied.  Patient was then extubated and taken to the PACU in stable condition.  Sponge, needle, and instrument counts were correct at the end of the case x2.  There were no known complications.  POSTOPERATIVE PLAN: Postoperatively, the patient be readmitted to the hospitalist.  Weightbearing restrictions per Dr. Doran Durand to the left lower extremity.  Continue IV vancomycin and Zosyn per pharmacy dosing for now.  Follow intraoperative left hip wound culture.  Patient will need infectious disease consult for antibiotic selection and duration once the cultures are back.  I would plan for 6 weeks of IV antibiotics followed by prolonged oral antibiotics due to immunocompromised host status.  We will monitor the JP drain output.  Patient will need disposition planning.

## 2020-01-19 NOTE — Progress Notes (Signed)
Physical Therapy Treatment Patient Details Name: [name] MRN: [id number] DOB: 1965-07-11 Today's Date: 01/19/2020    History of Present Illness 54 year old female with DM, HTN, CKD 5, status post left foot Chopart amputation 2014 managed by podiatry, left SFA vascularization in 2020, prior CVA who came into the hospital after a ground-level fall resulting in left hip fracture,   S/P Intramedullary fixation, Left femur on 10/28. 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    PT Comments    Pt supine on arrival, alert and agreeable to supine BLE therapeutic exercises as pt has I&D surgery planned later in day and anxious/pain limited. Pt performed BLE A/AAROM therapeutic exercises as detailed below, reporting most pain in hip and needing physical assist for supine hip flexion of residual limb due to pain/weakness. Pt continues to benefit from PT services to progress toward functional mobility goals. Will plan for EOB/OOB mobility next session if pain control improves. Pt continues to benefit from PT services to progress toward functional mobility goals. D/C recs below remain appropriate.  Follow Up Recommendations  CIR;Supervision/Assistance - 24 hour     Equipment Recommendations  Wheelchair (measurements PT);Wheelchair cushion (measurements PT);Rolling walker with 5" wheels;3in1 (PT)    Recommendations for Other Services Rehab consult     Precautions / Restrictions Precautions Precautions: Fall Precaution Comments: new L BKA Required Braces or Orthoses: Other Brace (LLE limbguard) Restrictions Weight Bearing Restrictions: Yes LLE Weight Bearing: Non weight bearing    Mobility  Bed Mobility               General bed mobility comments: pt refused rolling/side lying or EOB mobility despite encouragement  Transfers                    Ambulation/Gait                 Stairs             Wheelchair Mobility    Modified Rankin (Stroke Patients Only)        Balance                                            Cognition Arousal/Alertness: Awake/alert Behavior During Therapy: Flat affect Overall Cognitive Status: Within Functional Limits for tasks assessed                                 General Comments: Self limiting      Exercises General Exercises - Lower Extremity Ankle Circles/Pumps: AROM;Right;10 reps;Supine Quad Sets: AROM;Strengthening;Both;10 reps;Supine Gluteal Sets: AROM;Strengthening;Both;10 reps;Supine Hip ABduction/ADduction: AROM;Both;10 reps;Supine Hip Flexion/Marching: AAROM;Strengthening;Left;10 reps;Supine (very minimal assist to initiate) Amputee Exercises Hip Extension: AROM;Strengthening;Left;10 reps;Supine    General Comments General comments (skin integrity, edema, etc.): pt pain limited and plan for OR today for I&D, limited supine session; verbal instruction on likely progression of therapy, signs of infection, importance of prone positioning once tolerated, phantom pain/sensation reduction techniques      Pertinent Vitals/Pain Pain Assessment: 0-10 Pain Score: 8  Pain Location: L hip Pain Descriptors / Indicators: Guarding;Grimacing;Sore;Constant Pain Intervention(s): Limited activity within patient's tolerance;Monitored during session   Vitals:   01/19/20 0300  BP: (!) 118/45  Pulse: 66  Resp: 18  Temp: 98.6 F (37 C)  SpO2: 99%    Home Living  Prior Function            PT Goals (current goals can now be found in the care plan section) Acute Rehab PT Goals Patient Stated Goal: to reduce pain PT Goal Formulation: With patient Time For Goal Achievement: 01/25/20 Potential to Achieve Goals: Fair Progress towards PT goals: Progressing toward goals (goals need to be extended-PT notified)    Frequency    Min 3X/week      PT Plan Current plan remains appropriate    Co-evaluation              AM-PAC PT "6  Clicks" Mobility   Outcome Measure  Help needed turning from your back to your side while in a flat bed without using bedrails?: A Little Help needed moving from lying on your back to sitting on the side of a flat bed without using bedrails?: A Lot Help needed moving to and from a bed to a chair (including a wheelchair)?: Total Help needed standing up from a chair using your arms (e.g., wheelchair or bedside chair)?: Total Help needed to walk in hospital room?: Total Help needed climbing 3-5 steps with a railing? : Total 6 Click Score: 9    End of Session   Activity Tolerance: Patient tolerated treatment well;Patient limited by pain Patient left: in bed;with call bell/phone within reach Nurse Communication: Mobility status PT Visit Diagnosis: Muscle weakness (generalized) (M62.81);Other abnormalities of gait and mobility (R26.89) Pain - Right/Left: Left Pain - part of body: Hip     Time: 2774-1287 PT Time Calculation (min) (ACUTE ONLY): 18 min  Charges:  $Therapeutic Exercise: 8-22 mins                     [name], PTA Acute Rehabilitation Services Pager: [phone] Office: [place] 01/19/2020, 10:35 AM

## 2020-01-19 NOTE — Transfer of Care (Signed)
Immediate Anesthesia Transfer of Care Note  Patient: [name]  Procedure(s) Performed: IRRIGATION AND DEBRIDEMENT Left Hip (Left )  Patient Location: PACU  Anesthesia Type:General  Level of Consciousness: drowsy and patient cooperative  Airway & Oxygen Therapy: Patient Spontanous Breathing and Patient connected to face mask oxygen  Post-op Assessment: Report given to RN and Post -op Vital signs reviewed and stable  Post vital signs: Reviewed  Last Vitals:  Vitals Value Taken Time  BP 120/50 01/19/20 1547  Temp    Pulse 56 01/19/20 1554  Resp 38 01/19/20 1554  SpO2 100 % 01/19/20 1554  Vitals shown include unvalidated device data.  Last Pain:  Vitals:   01/19/20 1222  TempSrc: Oral  PainSc:       Patients Stated Pain Goal: 1 (63/81/77 1165)  Complications: No complications documented.

## 2020-01-20 ENCOUNTER — Encounter (HOSPITAL_COMMUNITY): Payer: Self-pay | Admitting: Orthopedic Surgery

## 2020-01-20 DIAGNOSIS — L02416 Cutaneous abscess of left lower limb: Secondary | ICD-10-CM | POA: Diagnosis not present

## 2020-01-20 DIAGNOSIS — E8770 Fluid overload, unspecified: Secondary | ICD-10-CM | POA: Diagnosis not present

## 2020-01-20 DIAGNOSIS — D649 Anemia, unspecified: Secondary | ICD-10-CM | POA: Diagnosis not present

## 2020-01-20 DIAGNOSIS — L97524 Non-pressure chronic ulcer of other part of left foot with necrosis of bone: Secondary | ICD-10-CM | POA: Diagnosis not present

## 2020-01-20 DIAGNOSIS — N186 End stage renal disease: Secondary | ICD-10-CM | POA: Diagnosis not present

## 2020-01-20 DIAGNOSIS — I120 Hypertensive chronic kidney disease with stage 5 chronic kidney disease or end stage renal disease: Secondary | ICD-10-CM | POA: Diagnosis not present

## 2020-01-20 DIAGNOSIS — T148XXA Other injury of unspecified body region, initial encounter: Secondary | ICD-10-CM | POA: Diagnosis not present

## 2020-01-20 DIAGNOSIS — D62 Acute posthemorrhagic anemia: Secondary | ICD-10-CM | POA: Diagnosis not present

## 2020-01-20 DIAGNOSIS — N185 Chronic kidney disease, stage 5: Secondary | ICD-10-CM | POA: Diagnosis not present

## 2020-01-20 DIAGNOSIS — N179 Acute kidney failure, unspecified: Secondary | ICD-10-CM | POA: Diagnosis not present

## 2020-01-20 DIAGNOSIS — E1129 Type 2 diabetes mellitus with other diabetic kidney complication: Secondary | ICD-10-CM | POA: Diagnosis not present

## 2020-01-20 LAB — CBC WITH DIFFERENTIAL/PLATELET
Abs Immature Granulocytes: 0.1 10*3/uL — ABNORMAL HIGH (ref 0.00–0.07)
Basophils Absolute: 0.1 10*3/uL (ref 0.0–0.1)
Basophils Relative: 1 %
Eosinophils Absolute: 0.1 10*3/uL (ref 0.0–0.5)
Eosinophils Relative: 1 %
HCT: 24.8 % — ABNORMAL LOW (ref 36.0–46.0)
Hemoglobin: 7.3 g/dL — ABNORMAL LOW (ref 12.0–15.0)
Immature Granulocytes: 1 %
Lymphocytes Relative: 13 %
Lymphs Abs: 1.4 10*3/uL (ref 0.7–4.0)
MCH: 27.2 pg (ref 26.0–34.0)
MCHC: 29.4 g/dL — ABNORMAL LOW (ref 30.0–36.0)
MCV: 92.5 fL (ref 80.0–100.0)
Monocytes Absolute: 1.5 10*3/uL — ABNORMAL HIGH (ref 0.1–1.0)
Monocytes Relative: 14 %
Neutro Abs: 7.6 10*3/uL (ref 1.7–7.7)
Neutrophils Relative %: 70 %
Platelets: 282 10*3/uL (ref 150–400)
RBC: 2.68 MIL/uL — ABNORMAL LOW (ref 3.87–5.11)
RDW: 15.3 % (ref 11.5–15.5)
WBC: 10.7 10*3/uL — ABNORMAL HIGH (ref 4.0–10.5)
nRBC: 0 % (ref 0.0–0.2)

## 2020-01-20 LAB — GLUCOSE, CAPILLARY
Glucose-Capillary: 80 mg/dL (ref 70–99)
Glucose-Capillary: 307 mg/dL — ABNORMAL HIGH (ref 70–99)
Glucose-Capillary: 299 mg/dL — ABNORMAL HIGH (ref 70–99)
Glucose-Capillary: 211 mg/dL — ABNORMAL HIGH (ref 70–99)
Glucose-Capillary: 240 mg/dL — ABNORMAL HIGH (ref 70–99)

## 2020-01-20 LAB — RENAL FUNCTION PANEL
Albumin: 1.7 g/dL — ABNORMAL LOW (ref 3.5–5.0)
Anion gap: 11 (ref 5–15)
BUN: 34 mg/dL — ABNORMAL HIGH (ref 6–20)
CO2: 24 mmol/L (ref 22–32)
Calcium: 8.1 mg/dL — ABNORMAL LOW (ref 8.9–10.3)
Chloride: 96 mmol/L — ABNORMAL LOW (ref 98–111)
Creatinine, Ser: 4.92 mg/dL — ABNORMAL HIGH (ref 0.44–1.00)
GFR, Estimated: 10 mL/min — ABNORMAL LOW (ref >60)
Glucose, Bld: 234 mg/dL — ABNORMAL HIGH (ref 70–99)
Phosphorus: 3.7 mg/dL (ref 2.5–4.6)
Potassium: 4.2 mmol/L (ref 3.5–5.1)
Sodium: 131 mmol/L — ABNORMAL LOW (ref 135–145)

## 2020-01-20 LAB — PREPARE RBC (CROSSMATCH)

## 2020-01-20 MED ORDER — SODIUM CHLORIDE 0.9% IV SOLUTION: Freq: Once | INTRAVENOUS | Status: DC

## 2020-01-20 MED ORDER — ACETAMINOPHEN 325 MG PO TABS
ORAL_TABLET | ORAL | Status: AC
  Filled 2020-01-20: qty 2

## 2020-01-20 MED ORDER — SODIUM CHLORIDE 0.9 % IV SOLN: 100.0000 mL | INTRAVENOUS | Status: DC | PRN

## 2020-01-20 MED ORDER — ALTEPLASE 2 MG IJ SOLR: 2.0000 mg | Freq: Once | INTRAMUSCULAR | Status: DC | PRN

## 2020-01-20 MED ORDER — DARBEPOETIN ALFA 100 MCG/0.5ML IJ SOSY
PREFILLED_SYRINGE | INTRAMUSCULAR | Status: AC
  Administered 2020-01-20: 100 ug
  Filled 2020-01-20: qty 0.5

## 2020-01-20 MED ORDER — HEPARIN SODIUM (PORCINE) 1000 UNIT/ML DIALYSIS
1000.0000 [IU] | INTRAMUSCULAR | Status: DC | PRN
  Administered 2020-01-22: 1000 [IU] via INTRAVENOUS_CENTRAL
  Filled 2020-01-20 (×2): qty 1

## 2020-01-20 MED ORDER — HEPARIN SODIUM (PORCINE) 1000 UNIT/ML IJ SOLN
INTRAMUSCULAR | Status: AC
  Filled 2020-01-20: qty 4

## 2020-01-20 MED ORDER — CHLORHEXIDINE GLUCONATE CLOTH 2 % EX PADS
6.0000 | MEDICATED_PAD | Freq: Every day | CUTANEOUS | Status: DC
  Administered 2020-01-20 – 2020-01-22 (×2): 6 via TOPICAL

## 2020-01-20 MED ORDER — PENTAFLUOROPROP-TETRAFLUOROETH EX AERO: 1.0000 "application " | INHALATION_SPRAY | CUTANEOUS | Status: DC | PRN

## 2020-01-20 MED ORDER — HEPARIN SODIUM (PORCINE) 1000 UNIT/ML DIALYSIS: 1000.0000 [IU] | INTRAMUSCULAR | Status: DC | PRN

## 2020-01-20 MED ORDER — PIPERACILLIN-TAZOBACTAM IN DEX 2-0.25 GM/50ML IV SOLN
2.2500 g | Freq: Three times a day (TID) | INTRAVENOUS | Status: DC
  Administered 2020-01-20 – 2020-01-21 (×4): 2.25 g via INTRAVENOUS
  Filled 2020-01-20 (×5): qty 50

## 2020-01-20 MED ORDER — VANCOMYCIN HCL IN DEXTROSE 1-5 GM/200ML-% IV SOLN
1000.0000 mg | INTRAVENOUS | Status: DC
  Administered 2020-01-20: 1000 mg via INTRAVENOUS
  Filled 2020-01-20 (×3): qty 200

## 2020-01-20 NOTE — Progress Notes (Signed)
Inpatient Rehabilitation-Admissions Coordinator   Received insurance approval for admit to CIR. However, I do not have a bed available for this patient until next week at this time. Notified pt of the approval. Will follow for bed availability and medical readiness.   Raechel Ache, OTR/L  Rehab Admissions Coordinator  [phone] 01/20/2020 3:24 PM

## 2020-01-20 NOTE — Plan of Care (Signed)
  Problem: Clinical Measurements: Goal: Respiratory complications will improve Outcome: Progressing   Problem: Coping: Goal: Level of anxiety will decrease Outcome: Progressing   Problem: Elimination: Goal: Will not experience complications related to bowel motility Outcome: Progressing

## 2020-01-20 NOTE — Progress Notes (Signed)
[facility] Progress Note  Subjective:  Seen in room, having pain in hip after procedure yesterday.  For dialysis today.     Vitals:   01/19/20 1940 01/20/20 0144 01/20/20 0505 01/20/20 0627  BP: 122/61 (!) 125/42 (!) 125/51   Pulse: 72 70 72   Resp: _0 Temp: 98.5 F (36.9 C) 98.6 F (37 C) 99.2 F (37.3 C)   TempSrc: Oral     SpO2: 93% 99% 98%   Weight:    91.7 kg  Height:        Exam: Gen alert, no distress Chest clear bilat to bases, no rales RRR no MRG Abd soft ntnd no mass or ascites +bs 1+ RLE edema, L BKA in stump protector Neuro is alert, Ox 3 , nf RIJ TDC c/d/i, L AC fossa incision c/d/i, L radial pulse strong and hand is warm; bruit present    Home meds:  - norvasc 5/ coreg 6.25/ asa / lipitor / hydralazine 50 tid  - insulin lantus 50u/ aspart 10u tid  - oxy IR prn/ cymbalta qd  - dexilant/ flexeril prn/ protonix 40     CXR - no acute disease     Last creat's outpatient >>      Aug 2021 - creat 4.9      Dec 23, 2019 - creat 5.2        Assessment/ Plan: 1. AoCKD 5 now ESRD- b/l creat 4.9- 5.2, eGFR 9- 11 from 8/21- 10/21. Pt f/b Dr Posey Pronto. Creat 6 on admission, then mid 7's, IVF's given w/o improvement. S/p TDC and AVF on 11/8, 1st HD 11/9, HD#2 11/10, #3 11/12.  CLIP to outpatient center Cont MWF schedule  No heparin. Next HD 11/19 2. Anemia ckd - Hb 7.3, trending down post-op. Was 6.3 on admit. S/p 4 U prbcs.   Tsat low 10%, got 554m Feraheme on 11/6. Increase Aranesp 60 to 100 q week on 11/17. Transfuse prn  3. 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 L hip fracture - admission diagnosis. sp ORIF on 10/28 per ortho. S/p I&D surgical wound 11/18. Cultures pending. IV antibiotics started.  5. HTN/ vol  - BP's are stable / normal on her 3 home meds. Some volume on exam, UF for volume removal  6. DM - on insulin, per pmd 7. BMM - started binder, phos at goal now. PTH 44.  8. Dispo.  [facility]. Still need to clairify discharge plans. HH vs SNF vs inpatient rehab.    OLynnda ChildPA-C [facility] 01/20/2020,8:53 AM   Recent Labs  Lab 01/17/20 0931 [id number] [id number] 01/18/20 0832 01/19/20 0746 01/19/20 0746 01/19/20 1858 01/20/20 0034  K 4.1  --  4.1  --   --   --   --   --   BUN 26*  --  37*  --   --   --   --   --   CREATININE 3.42*   < > 4.17*   < > 3.41*  --  4.00*  --   CALCIUM 8.4*  --  8.3*  --   --   --   --   --   PHOS 3.5  --  4.0  --   --   --   --   --   HGB 8.1*  --   --    < >  7.7*   < > 7.9* 7.3*   < > = values in this interval not displayed.   Inpatient medications: . (feeding supplement) PROSource Plus  30 mL Oral BID BM  . amLODipine  10 mg Oral Daily  . atorvastatin  40 mg Oral q1800  . carvedilol  25 mg Oral BID WC  . Chlorhexidine Gluconate Cloth  6 each Topical Daily  . darbepoetin (ARANESP) injection - DIALYSIS  100 mcg Intravenous Q Wed-HD  . dexlansoprazole  60 mg Oral Daily  . docusate sodium  100 mg Oral BID  . DULoxetine  60 mg Oral Daily  . enoxaparin (LOVENOX) injection  30 mg Subcutaneous Q24H  . hydrALAZINE  100 mg Oral TID  . insulin aspart  0-5 Units Subcutaneous QHS  . insulin aspart  0-9 Units Subcutaneous TID WC  . insulin aspart  8 Units Subcutaneous TID WC  . insulin glargine  38 Units Subcutaneous QHS  . multivitamin  1 tablet Oral QHS  . polyethylene glycol  17 g Oral Daily  . senna  1 tablet Oral BID  . sevelamer carbonate  800 mg Oral TID WC   . sodium chloride Stopped (01/09/20 0759)  . sodium chloride 10 mL/hr at 01/10/20 1510  . piperacillin-tazobactam (ZOSYN)  IV    . vancomycin     sodium chloride, acetaminophen, calcium carbonate, cyclobenzaprine, diphenhydrAMINE, guaiFENesin-dextromethorphan, hydrALAZINE, HYDROmorphone (DILAUDID) injection, lip balm, loratadine, menthol-cetylpyridinium **OR** phenol, metoCLOPramide **OR**  metoCLOPramide (REGLAN) injection, Muscle Rub, ondansetron **OR** ondansetron (ZOFRAN) IV, oxyCODONE, oxyCODONE, polyvinyl alcohol, sodium chloride

## 2020-01-20 NOTE — Plan of Care (Signed)
  Problem: Health Behavior/Discharge Planning: Goal: Ability to manage health-related needs will improve Outcome: Progressing   

## 2020-01-20 NOTE — Progress Notes (Signed)
PT Cancellation Note  Patient Details Name: [name] MRN: [id number] DOB: January 20, 1966   Cancelled Treatment:    Reason Eval/Treat Not Completed: (P) Patient at procedure or test/unavailable; Pt at HD when attempted (10 AM), then defer in PM due to increased LLE pain since I&D yesterday. Pt reports compliance with HEP, re-reviewed importance of pressure offloading/positional changes and encouraged pt to work with PT over weekend if able.   [name] [name] 01/20/2020, 4:09 PM

## 2020-01-20 NOTE — Progress Notes (Signed)
PROGRESS NOTE   [name]  KGY:[id number]    DOB: Oct 25, 1965    DOA: 12/28/2019  PCP: Julian Hy, PA-C   I have briefly reviewed patients previous medical records in [facility].  Chief Complaint  Patient presents with  . 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  Initially admitted for left hip fracture and underwent surgery on 12/29/2019.  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  Nephrology consulted, tunneled HD catheter placed 01/09/2020 and undergoing regular HD.  Consulted orthopedics/Dr. Lyla Glassing again on 11/18 due to reported purulent drainage from left hip surgical site.  S/p I&D of left hip abscess in OR.  Awaiting final culture sensitivity prior to calling ID consultation for antibiotic choice and duration recommendation.  Was able to overturn insurance CIR denial at peer to peer phone call on 11/19.   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 Patient's insurance had initially denied admission to rehab.  I did a peer to peer discussion with Dr. Annamaria Boots at [facility], discussed patient's case in detail, updated recent I&D.  She indicated that patient needs complex medical care, needs close daily provider follow-up while undergoing rehab and overturned insurance denial and approved admission to inpatient rehab when medically ready. -Continue pain control.  Pain slightly worsened post I&D. - S/p I&D of left hip abscess on 11/18.  Post procedure, I discussed with Dr. Lyla Glassing.  He has empirically started her on IV Zosyn and vancomycin pending culture results.  He feels that given her immunocompromise status, she may need to be on long-term antibiotics and recommends ID consultation once final culture results are back.  Surgical JP drain in place-draining thin bloody fluid.  Preliminary abscess culture NTD.  Hopefully can pick antibiotics that can be given across HD.  Acute kidney injury on chronic kidney disease, stage V with metabolic acidosis, progressed to ESRD -Patient noted to have volume overload> as evidenced by pitting flank edema -Failed Lasix trial -Was treated on bicarb infusion -Nephrology consulted and following -Status post tunnel catheter on 01/09/2020 with dialysis starting on 01/10/2020 -Renal navigator assisted with clip.  Patient has been accepted at [facility] kidney center on MWF schedule with a seat time of 12:10 PM. -Management per nephrology.  HD on MWF schedule.  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  Technically will be  nonweightbearing for 6 weeks while stump heals.  She can then get a prosthesis. -will need follow up with Dr. Doran Durand in 2 weeks  Constipation/IBS with diarrhea -Likely compounded by narcotics and being bedbound -Lotronex held to prevent further constipation -Continue Dulcolax suppository and bowel regimen with MiraLAX, Colace and senna -was able to have a bowel movement  Mild hyponatremia/hypochloremia -Managed with hemodialysis -improving/stable, continue to monitor   Leukocytosis -Resolved today.  May have been related to left hip abscess.  Follow CBC closely.  Diabetes mellitus, type II -Patient uses Lantus and NovoLog at home -Hemoglobin A1c was 6.2 on 12/29/2019 -Continue Lantus, insulin sliding scale, along with 8 units with meals, and CBG monitoring -She had a hypoglycemic episode on morning of 11/18, CBG 54, likely due to n.p.o.  However since last night, CBGs up [phone].  Monitor closely and adjust insulins as needed.  GERD -Continue PPI  Essential hypertension -Continue Coreg, hydralazine, amlodipine -BP stable  Acute blood loss anemia on chronic anemia/anemia of chronic disease -Suspect multifactorial including acute infection, hip fracture, renal disease and I&D on 11/18 -Hemoglobin did drop several times and patient thus far has received 4 units of PRBC as well as Feraheme -Hemoglobin down to 7.3 on 11/19, with ongoing bloody drainage of through left hip JP drain, suspect will drop further, discussed with nephrology and plan to transfuse 2 units PRBC across HD.  Patient agreeable.  Physical Deconditioning -PT consulted working with patient although at times she has declined due to pain -Unable to obtain home health agency given place of residence [place], [place]) -  Patient's insurance had initially denied admission to rehab.  I did a peer to peer discussion with Dr. Annamaria Boots at [facility], discussed patient's case in detail, updated recent I&D.  She  indicated that patient needs complex medical care, needs close daily provider follow-up while undergoing rehab and thereby overturned insurance denial and approved admission to inpatient rehab when medically ready.  Dr. Annamaria Boots is a rehab MD herself.  Suspected UTI: Foley catheter placed in OR on 11/18 and Dr. Lyla Glassing noted purulent urine, sent for culture and sensitivity.  Empirically started on IV Zosyn.  Foley catheter removed.  Body mass index is 29.01 kg/m.  Nutritional Status Nutrition Problem: Increased nutrient needs Etiology: post-op healing Signs/Symptoms: estimated needs Interventions: MVI, Nepro shake     DVT prophylaxis: enoxaparin (LOVENOX) injection 30 mg Start: 01/20/20 0800 SCDs Start: 01/19/20 1713     Code Status: Full Code Family Communication: None at bedside. Disposition:  Status is: Inpatient  Remains inpatient appropriate because:Inpatient level of care appropriate due to severity of illness   Dispo: The patient is from: Home              Anticipated d/c is to: CIR, hopefully early next week.              Anticipated d/c date is: > 3 days              Patient currently is not medically stable to d/c.        Consultants:   Infectious disease Orthopedic surgery Vascular surgery Nephrology  Procedures:   Left IM nail 12/29/2019 Left BKA 01/12/2020 Tunneled catheter placement on 01/09/2020 Ongoing HD's S/p I&D of left hip abscess  in [place] on 11/18.  Antimicrobials:    Anti-infectives (From admission, onward)   Start     Dose/Rate Route Frequency Ordered Stop   01/20/20 1400  piperacillin-tazobactam (ZOSYN) IVPB 2.25 g        2.25 g 100 mL/hr over 30 Minutes Intravenous Every 8 hours 01/20/20 0746     01/20/20 1200  vancomycin (VANCOCIN) IVPB 1000 mg/200 mL premix        1,000 mg 200 mL/hr over 60 Minutes Intravenous Every M-W-F (Hemodialysis) 01/20/20 0746     01/19/20 1230  ceFAZolin (ANCEF) IVPB 2g/100 mL premix        2 g 200 mL/hr over  30 Minutes Intravenous On call to O.R. 01/19/20 1212 01/19/20 1408   01/19/20 0845  piperacillin-tazobactam (ZOSYN) IVPB 2.25 g  Status:  Discontinued        2.25 g 100 mL/hr over 30 Minutes Intravenous Every 6 hours 01/19/20 0750 01/20/20 0746   01/19/20 0845  vancomycin (VANCOREADY) IVPB 2000 mg/400 mL        2,000 mg 200 mL/hr over 120 Minutes Intravenous  Once 01/19/20 0752 01/19/20 1231   01/19/20 0752  vancomycin variable dose per unstable renal function (pharmacist dosing)  Status:  Discontinued         Does not apply See admin instructions 01/19/20 0752 01/20/20 0746   01/12/20 0806  vancomycin (VANCOCIN) powder  Status:  Discontinued          As needed 01/12/20 0806 01/12/20 0946   01/10/20 2000  ceFAZolin (ANCEF) IVPB 1 g/50 mL premix  Status:  Discontinued        1 g 100 mL/hr over 30 Minutes Intravenous Every 24 hours 01/10/20 1343 01/14/20 1311   01/10/20 0600  ceFAZolin (ANCEF) IVPB 2g/100 mL premix        2 g 200 mL/hr over 30 Minutes Intravenous On call to O.R. 01/09/20 1952 01/11/20 0559   01/06/20 0400  ceFAZolin (ANCEF) IVPB 2g/100 mL premix  Status:  Discontinued        2 g 200 mL/hr over 30 Minutes Intravenous Every 12 hours 01/05/20 1709 01/10/20 1343   01/05/20 2200  ceFAZolin (ANCEF) IVPB 2g/100 mL premix  Status:  Discontinued        2 g 200 mL/hr over 30 Minutes Intravenous Every 8 hours 01/05/20 1707 01/05/20 1709   01/04/20 1700  ceFAZolin (ANCEF) IVPB 1 g/50 mL premix  Status:  Discontinued        1 g 100 mL/hr over 30 Minutes Intravenous Every 12 hours 01/04/20 1611 01/05/20 1707   12/29/19 1930  ceFAZolin (ANCEF) IVPB 2g/100 mL premix        2 g 200 mL/hr over 30 Minutes Intravenous Every 6 hours 12/29/19 1620 12/30/19 0201   12/29/19 1300  ceFAZolin (ANCEF) IVPB 2g/100 mL premix        2 g 200 mL/hr over 30 Minutes Intravenous On call to O.R. 12/29/19 0113 12/29/19 1325        Subjective:  As per RN, Foley catheter removed early this morning.   Patient reports more pain in her left hip post surgery.  Surgical site drain with bloody fluid.  Denies dyspnea, dizziness, lightheadedness or chest pain.  Agreeable to blood transfusion.  Objective:   Vitals:   01/20/20 0955 01/20/20 1000 01/20/20 1015 01/20/20 1030  BP: (!) 144/65 (!) 149/61 (!) 154/74 (!) 122/99  Pulse: 73 74 71 72  Resp: 17 18 16    Temp: 98.9 F (37.2  C)     TempSrc: Oral     SpO2: 96%     Weight: 91.7 kg     Height:        General exam: Young female, moderately built and nourished lying comfortably propped up in bed. Respiratory system: Clear to auscultation. Respiratory effort normal.  Right upper anterior chest TDC site appears clean and dry. Cardiovascular system: S1 & S2 heard, RRR. No JVD, murmurs, rubs, gallops or clicks. No pedal edema. Gastrointestinal system: Abdomen is nondistended, soft and nontender. No organomegaly or masses felt. Normal bowel sounds heard. Central nervous system: Alert and oriented. No focal neurological deficits. Extremities: Symmetric 5 x 5 power.  Left BKA dressing site clean and dry.  Has stump protector in place. Skin: No rashes, lesions or ulcers.  Left hip still with fair amount of induration/edema, faint patchy erythema, more tender than preop, has a JP drain with bloody thin fluid filling half of the bulb. Psychiatry: Judgement and insight appear normal. Mood & affect appropriate.     Data Reviewed:   I have personally reviewed following labs and imaging studies   CBC: Recent Labs  Lab 01/19/20 0746 01/19/20 1858 01/20/20 0034  WBC 10.0 15.5* 10.7*  NEUTROABS  --   --  7.6  HGB 7.7* 7.9* 7.3*  HCT 25.3* 26.9* 24.8*  MCV 91.7 93.1 92.5  PLT 296 283 502    Basic Metabolic Panel: Recent Labs  Lab 01/17/20 0613 01/17/20 0613 01/18/20 0605 01/18/20 0605 01/19/20 0746 01/19/20 1858 01/20/20 0952  NA 134*  --  132*  --   --   --  131*  K 4.1  --  4.1  --   --   --  4.2  CL 99  --  98  --   --   --  96*   CO2 26  --  23  --   --   --  24  GLUCOSE 207*  --  178*  --   --   --  234*  BUN 26*  --  37*  --   --   --  34*  CREATININE 3.42*   < > 4.17*   < > 3.41* 4.00* 4.92*  CALCIUM 8.4*  --  8.3*  --   --   --  8.1*  PHOS 3.5  --  4.0  --   --   --  3.7   < > = values in this interval not displayed.    Liver Function Tests: Recent Labs  Lab 01/17/20 7741 01/18/20 0605 01/20/20 0952  ALBUMIN 1.7* 1.7* 1.7*    CBG: Recent Labs  Lab 01/19/20 2039 01/20/20 0625 01/20/20 0719  GLUCAP 285* 307* 299*    Microbiology Studies:   Recent Results (from the past 240 hour(s))  Aerobic/Anaerobic Culture (surgical/deep wound)     Status: None (Preliminary result)   Collection Time: 01/19/20  2:24 PM   Specimen: PATH Other; Tissue  Result Value Ref Range Status   Specimen Description ABSCESS LEFT HIP  Final   Special Requests PT ON ANCEF  Final   Gram Stain   Final    ABUNDANT WBC PRESENT, PREDOMINANTLY PMN MODERATE GRAM NEGATIVE RODS RARE GRAM POSITIVE COCCI RARE GRAM POSITIVE RODS    Culture   Final    NO GROWTH < 12 HOURS Performed at [facility], [place] [address], [place], [place] [address]    Report Status PENDING  Incomplete     Radiology Studies:  DG HIP UNILAT WITH PELVIS 2-3 VIEWS LEFT  Result Date: 01/19/2020 CLINICAL DATA:  Wound drainage following surgery for a femur fracture on 12/29/2019. Status post left below-the-knee amputation on 01/12/2020. EXAM: DG HIP (WITH OR WITHOUT PELVIS) 2-3V LEFT COMPARISON:  12/29/2019. FINDINGS: Intramedullary rod and compression screw fixation of the previously demonstrated comminuted left intertrochanteric fracture. There is soft tissue gas at the fracture site and in the medial soft tissues of the thigh. No bone destruction or periosteal reaction seen. No additional fractures and no dislocation. Small calcified uterine fibroid. IMPRESSION: Soft tissue gas in the medial soft tissues of the thigh and at the left intertrochanteric  fracture site, suspicious for infection with a gas-forming organism. Electronically Signed   By: Claudie Revering M.D.   On: 01/19/2020 09:38     Scheduled Meds:   . (feeding supplement) PROSource Plus  30 mL Oral BID BM  . sodium chloride   Intravenous Once  . amLODipine  10 mg Oral Daily  . atorvastatin  40 mg Oral q1800  . carvedilol  25 mg Oral BID WC  . Chlorhexidine Gluconate Cloth  6 each Topical Daily  . darbepoetin (ARANESP) injection - DIALYSIS  100 mcg Intravenous Q Wed-HD  . dexlansoprazole  60 mg Oral Daily  . docusate sodium  100 mg Oral BID  . DULoxetine  60 mg Oral Daily  . enoxaparin (LOVENOX) injection  30 mg Subcutaneous Q24H  . hydrALAZINE  100 mg Oral TID  . insulin aspart  0-5 Units Subcutaneous QHS  . insulin aspart  0-9 Units Subcutaneous TID WC  . insulin aspart  8 Units Subcutaneous TID WC  . insulin glargine  38 Units Subcutaneous QHS  . multivitamin  1 tablet Oral QHS  . polyethylene glycol  17 g Oral Daily  . senna  1 tablet Oral BID  . sevelamer carbonate  800 mg Oral TID WC    Continuous Infusions:   . sodium chloride Stopped (01/09/20 0759)  . sodium chloride 10 mL/hr at 01/10/20 1510  . [START ON 01/21/2020] sodium chloride    . [START ON 01/21/2020] sodium chloride    . piperacillin-tazobactam (ZOSYN)  IV    . vancomycin       LOS: 23 days     Vernell Leep, MD, [place], [facility]. [facility]    To contact the attending provider between 7A-7P or the covering provider during after hours 7P-7A, please log into the web site [email or web address] and access using universal  password for that web site. If you do not have the password, please call the hospital operator.  01/20/2020, 11:37 AM

## 2020-01-20 NOTE — Progress Notes (Signed)
Pharmacy Antibiotic Note  [name] is a 54 y.o. female admitted on 12/28/2019 with wound infection.  Pharmacy has been consulted for vancomycin and Zosyn dosing. Patient has progressed to ESRD and now on MWF HD sessions and tolerating.  Afebrile, WBC down to 10.7.  Plan: Schedule vanc 1gm IV qHD MWF Change Zosyn to 2.25gm IV Q8H Monitor HD tolerance, micro data, vanc level as indicated  Height: 5\' 10"  (177.8 cm) Weight: 91.7 kg (202 lb 2.6 oz) IBW/kg (Calculated) : 68.5  Temp (24hrs), Avg:98.3 F (36.8 C), Min:97.1 F (36.2 C), Max:99.2 F (37.3 C)  Recent Labs  Lab 01/16/20 1130 01/16/20 1130 01/17/20 0613 01/18/20 0605 01/18/20 0832 01/19/20 0746 01/19/20 1858 01/20/20 0034  WBC 21.7*   < > 14.4*  --  12.7* 10.0 15.5* 10.7*  CREATININE 5.11*  --  3.42* 4.17*  --  3.41* 4.00*  --    < > = values in this interval not displayed.    Estimated Creatinine Clearance: 20.2 mL/min (A) (by C-G formula based on SCr of 4 mg/dL (H)).    Allergies  Allergen Reactions  . Lidocaine Itching  . Trazodone Swelling  . Latex Rash   Ancef 11/3 >> 11/13 Vanc 11/11 x1 Zosyn 11/18>> Vanc 11/18>>  11/3 BCx - negative 11/3 surgical PCR - MRSA negative, MSSA positive  11/18 left hip abscess - GPC / GNR / GPR on Gram stain  Jmari Pelc D. Mina Marble, PharmD, BCPS, [place] 01/20/2020, 7:45 AM

## 2020-01-21 DIAGNOSIS — L97524 Non-pressure chronic ulcer of other part of left foot with necrosis of bone: Secondary | ICD-10-CM | POA: Diagnosis not present

## 2020-01-21 DIAGNOSIS — N179 Acute kidney failure, unspecified: Secondary | ICD-10-CM | POA: Diagnosis not present

## 2020-01-21 DIAGNOSIS — L02416 Cutaneous abscess of left lower limb: Secondary | ICD-10-CM | POA: Diagnosis not present

## 2020-01-21 DIAGNOSIS — N185 Chronic kidney disease, stage 5: Secondary | ICD-10-CM | POA: Diagnosis not present

## 2020-01-21 DIAGNOSIS — D62 Acute posthemorrhagic anemia: Secondary | ICD-10-CM | POA: Diagnosis not present

## 2020-01-21 DIAGNOSIS — E8770 Fluid overload, unspecified: Secondary | ICD-10-CM | POA: Diagnosis not present

## 2020-01-21 DIAGNOSIS — T148XXA Other injury of unspecified body region, initial encounter: Secondary | ICD-10-CM | POA: Diagnosis not present

## 2020-01-21 DIAGNOSIS — N186 End stage renal disease: Secondary | ICD-10-CM | POA: Diagnosis not present

## 2020-01-21 DIAGNOSIS — D649 Anemia, unspecified: Secondary | ICD-10-CM | POA: Diagnosis not present

## 2020-01-21 DIAGNOSIS — I120 Hypertensive chronic kidney disease with stage 5 chronic kidney disease or end stage renal disease: Secondary | ICD-10-CM | POA: Diagnosis not present

## 2020-01-21 DIAGNOSIS — E1129 Type 2 diabetes mellitus with other diabetic kidney complication: Secondary | ICD-10-CM | POA: Diagnosis not present

## 2020-01-21 LAB — CBC WITH DIFFERENTIAL/PLATELET
Abs Immature Granulocytes: 0.15 10*3/uL — ABNORMAL HIGH (ref 0.00–0.07)
Basophils Absolute: 0.1 10*3/uL (ref 0.0–0.1)
Basophils Relative: 1 %
Eosinophils Absolute: 0.3 10*3/uL (ref 0.0–0.5)
Eosinophils Relative: 3 %
HCT: 28.9 % — ABNORMAL LOW (ref 36.0–46.0)
Hemoglobin: 9.1 g/dL — ABNORMAL LOW (ref 12.0–15.0)
Immature Granulocytes: 1 %
Lymphocytes Relative: 10 %
Lymphs Abs: 1.2 10*3/uL (ref 0.7–4.0)
MCH: 28.6 pg (ref 26.0–34.0)
MCHC: 31.5 g/dL (ref 30.0–36.0)
MCV: 90.9 fL (ref 80.0–100.0)
Monocytes Absolute: 2.2 10*3/uL — ABNORMAL HIGH (ref 0.1–1.0)
Monocytes Relative: 19 %
Neutro Abs: 7.9 10*3/uL — ABNORMAL HIGH (ref 1.7–7.7)
Neutrophils Relative %: 66 %
Platelets: 265 10*3/uL (ref 150–400)
RBC: 3.18 MIL/uL — ABNORMAL LOW (ref 3.87–5.11)
RDW: 15 % (ref 11.5–15.5)
WBC: 11.9 10*3/uL — ABNORMAL HIGH (ref 4.0–10.5)
nRBC: 0 % (ref 0.0–0.2)

## 2020-01-21 LAB — TYPE AND SCREEN
ABO/RH(D): A POS
Antibody Screen: NEGATIVE
Unit division: 0
Unit division: 0

## 2020-01-21 LAB — BPAM RBC
Blood Product Expiration Date: 202112162359
Blood Product Expiration Date: 202112162359
ISSUE DATE / TIME: 202111191153
ISSUE DATE / TIME: 202111191153
Unit Type and Rh: 6200
Unit Type and Rh: 6200

## 2020-01-21 LAB — GLUCOSE, CAPILLARY
Glucose-Capillary: 102 mg/dL — ABNORMAL HIGH (ref 70–99)
Glucose-Capillary: 128 mg/dL — ABNORMAL HIGH (ref 70–99)
Glucose-Capillary: 110 mg/dL — ABNORMAL HIGH (ref 70–99)
Glucose-Capillary: 257 mg/dL — ABNORMAL HIGH (ref 70–99)

## 2020-01-21 MED ORDER — VANCOMYCIN HCL IN DEXTROSE 1-5 GM/200ML-% IV SOLN: 1000.0000 mg | Freq: Once | INTRAVENOUS | Status: DC

## 2020-01-21 MED ORDER — DARBEPOETIN ALFA 100 MCG/0.5ML IJ SOSY: 100.0000 ug | PREFILLED_SYRINGE | INTRAMUSCULAR | Status: DC

## 2020-01-21 MED ORDER — SODIUM CHLORIDE 0.9 % IV SOLN
1.0000 g | INTRAVENOUS | Status: DC
  Administered 2020-01-21 – 2020-01-22 (×2): 1 g via INTRAVENOUS
  Filled 2020-01-21 (×3): qty 1

## 2020-01-21 MED ORDER — VANCOMYCIN HCL IN DEXTROSE 1-5 GM/200ML-% IV SOLN: 1000.0000 mg | INTRAVENOUS | Status: DC

## 2020-01-21 MED ORDER — CHLORHEXIDINE GLUCONATE CLOTH 2 % EX PADS: 6.0000 | MEDICATED_PAD | Freq: Every day | CUTANEOUS | Status: DC

## 2020-01-21 NOTE — Progress Notes (Addendum)
[facility] Progress Note   Subjective:   Pt seen in room. Reports mild hip pain. Denies SOB, CP, palpitations, dizziness and GI upset.   Objective Vitals:   01/20/20 1400 01/20/20 1612 01/20/20 2005 01/21/20 0510  BP: (!) 143/54 (!) 147/62 (!) 139/52 (!) 146/60  Pulse: 68 72 70 65  Resp: '17 16  18  ' Temp: 99.1 F (37.3 C) 98.7 F (37.1 C) 99.1 F (37.3 C) 98.8 F (37.1 C)  TempSrc: Oral Axillary Oral Oral  SpO2: 98% 99% 98% 100%  Weight: 88.8 kg     Height:       Physical Exam General: Well developed female, alert and in NAD Heart: RRR, no murmurs, rubs or gallops Lungs: CTA bilaterally without wheezing, rhonchi or rales Abdomen: Soft, non-tender, non-distended, +BS Extremities: trace edema RLE Dialysis Access: R IJ TDC, LUE AVF + thrill/bruit  Additional Objective Labs: Basic Metabolic Panel: Recent Labs  Lab 01/17/20 0613 01/17/20 0613 01/18/20 0605 01/18/20 0605 01/19/20 0746 01/19/20 1858 01/20/20 0952  NA 134*  --  132*  --   --   --  131*  K 4.1  --  4.1  --   --   --  4.2  CL 99  --  98  --   --   --  96*  CO2 26  --  23  --   --   --  24  GLUCOSE 207*  --  178*  --   --   --  234*  BUN 26*  --  37*  --   --   --  34*  CREATININE 3.42*   < > 4.17*   < > 3.41* 4.00* 4.92*  CALCIUM 8.4*  --  8.3*  --   --   --  8.1*  PHOS 3.5  --  4.0  --   --   --  3.7   < > = values in this interval not displayed.   Liver Function Tests: Recent Labs  Lab 01/17/20 3474 01/18/20 0605 01/20/20 0952  ALBUMIN 1.7* 1.7* 1.7*   CBC: Recent Labs  Lab 01/18/20 0832 01/18/20 0832 01/19/20 0746 01/19/20 0746 01/19/20 1858 01/20/20 0034 01/21/20 0607  WBC 12.7*   < > 10.0   < > 15.5* 10.7* 11.9*  NEUTROABS  --   --   --   --   --  7.6 7.9*  HGB 7.4*   < > 7.7*   < > 7.9* 7.3* 9.1*  HCT 25.3*   < > 25.3*   < > 26.9* 24.8* 28.9*  MCV 92.3  --  91.7  --  93.1 92.5 90.9  PLT 310   < > 296   < > 283 282 265   < > = values in this interval not  displayed.   Blood Culture    Component Value Date/Time   SDES ABSCESS LEFT HIP 01/19/2020 1424   SPECREQUEST PT ON ANCEF 01/19/2020 1424   CULT  01/19/2020 1424    NO GROWTH < 12 HOURS Performed at [facility] [address] [place], [place] [address]    REPTSTATUS PENDING 01/19/2020 1424   CBG: Recent Labs  Lab 01/20/20 0625 01/20/20 0719 01/20/20 1714 01/20/20 2054 01/21/20 0810  GLUCAP 307* 299* 211* 240* 102*   Medications: . sodium chloride Stopped (01/09/20 0759)  . sodium chloride 10 mL/hr at 01/10/20 1510  . sodium chloride    . sodium chloride    . piperacillin-tazobactam (ZOSYN)  IV 2.25 g (01/21/20 0521)  . [START ON 01/22/2020] vancomycin    . [START ON 01/27/2020] vancomycin    . [START ON 01/24/2020] vancomycin     . (feeding supplement) PROSource Plus  30 mL Oral BID BM  . sodium chloride   Intravenous Once  . amLODipine  10 mg Oral Daily  . atorvastatin  40 mg Oral q1800  . carvedilol  25 mg Oral BID WC  . Chlorhexidine Gluconate Cloth  6 each Topical Daily  . darbepoetin (ARANESP) injection - DIALYSIS  100 mcg Intravenous Q Wed-HD  . dexlansoprazole  60 mg Oral Daily  . docusate sodium  100 mg Oral BID  . DULoxetine  60 mg Oral Daily  . enoxaparin (LOVENOX) injection  30 mg Subcutaneous Q24H  . hydrALAZINE  100 mg Oral TID  . insulin aspart  0-5 Units Subcutaneous QHS  . insulin aspart  0-9 Units Subcutaneous TID WC  . insulin aspart  8 Units Subcutaneous TID WC  . insulin glargine  38 Units Subcutaneous QHS  . multivitamin  1 tablet Oral QHS  . polyethylene glycol  17 g Oral Daily  . senna  1 tablet Oral BID  . sevelamer carbonate  800 mg Oral TID WC    Home meds: - norvasc 5/ coreg 6.25/ asa / lipitor / hydralazine 50 tid - insulin lantus 50u/ aspart 10u tid - oxy IR prn/ cymbalta qd - dexilant/ flexeril prn/ protonix 40   CXR - no acute disease Last creat's outpatient >> Aug 2021 - creat 4.9 Dec 23, 2019 - creat 5.2  Assessment/Plan: 1. AoCKD 5 now ESRD- b/l creat 4.9- 5.2, eGFR 9- 11 from 8/21- 10/21. Creat 6 on admission, then mid 7's, IVF's given w/o improvement so started HD. S/p TDC and AVF on 11/8, 1st HD 11/9, HD#2 11/10, #3 11/12.  Continue MWF schedule but will dialyze Sunday, Tuesday, Friday this week due to holiday.  No heparin. Next HD tomorrow.  2. Anemia ckd - Hb up to 9.1 today. Was 6.3 on admit. S/p 4 U prbcs.   Tsat low 10%, got 560m Feraheme on 11/6. Increase Aranesp 60 to 100 q week on 11/17. Transfuse prn  3. 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 L hip fracture - admission diagnosis. sp ORIF on 10/28 per ortho. S/p I&D surgical wound 11/18. Cultures pending. IV antibiotics started.  5. HTN/ vol  - BP's are stable / normal on her 3 home meds. Some volume on exam, UF for volume removal  6. DM - on insulin, per pmd 7. BMM - started binder, phos at goal now. PTH 44.  8. [facility]. Reports she will be going to CIR here first.   Addendum: Per pharmacist, pt received aranesp Wednesday and Friday of this week. Will change aranesp dosing to q Friday and follow hemoglobin closely.   SAnice Paganini PA-C 01/21/2020, 10:35 AM  [facility] Pager: [phone]

## 2020-01-21 NOTE — Progress Notes (Signed)
Pharmacy Antibiotic Note  [name] is a 54 y.o. female admitted on 12/28/2019 with wound infection.  Pharmacy has been consulted for vancomycin and Zosyn dosing. Patient has progressed to ESRD and now on MWF HD sessions and tolerating.    Cultures now growing Enterobacter, pharmacy to transition to cefepime.  Plan: Stop vancomycin and Zosyn Cefepime 1g QHS  Height: 5\' 10"  (177.8 cm) Weight: 88.8 kg (195 lb 12.3 oz) IBW/kg (Calculated) : 68.5  Temp (24hrs), Avg:98.6 F (37 C), Min:97.9 F (36.6 C), Max:99.1 F (37.3 C)  Recent Labs  Lab 01/17/20 0613 01/17/20 0613 01/18/20 0605 01/18/20 0832 01/19/20 0746 01/19/20 1858 01/20/20 0034 01/20/20 0952 01/21/20 0607  WBC 14.4*   < >  --  12.7* 10.0 15.5* 10.7*  --  11.9*  CREATININE 3.42*  --  4.17*  --  3.41* 4.00*  --  4.92*  --    < > = values in this interval not displayed.    Estimated Creatinine Clearance: 16.2 mL/min (A) (by C-G formula based on SCr of 4.92 mg/dL (H)).    Allergies  Allergen Reactions  . Lidocaine Itching  . Trazodone Swelling  . Latex Rash   Ancef 11/3 >> 11/13 Vanc 11/11 x1 Zosyn 11/18>> Vanc 11/18>>  11/3 BCx - negative 11/3 surgical PCR - MRSA negative, MSSA positive  11/18 left hip abscess - GPC / GNR / GPR on Gram stain (rare Enterobacter)  Arrie Senate, PharmD, BCPS Clinical Pharmacist [phone] Please check AMION for all [place] numbers 01/21/2020

## 2020-01-21 NOTE — Progress Notes (Signed)
PROGRESS NOTE   [name]  OQH:[id number]    DOB: 08-12-1965    DOA: 12/28/2019  PCP: Julian Hy, PA-C   I have briefly reviewed patients previous medical records in [facility].  Chief Complaint  Patient presents with  . 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  Initially admitted for left hip fracture and underwent surgery on 12/29/2019.  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  Nephrology consulted, tunneled HD catheter placed 01/09/2020 and undergoing regular HD.  Consulted orthopedics/Dr. Lyla Glassing again on 11/18 due to reported purulent drainage from left hip surgical site.  S/p I&D of left hip abscess in OR.  Awaiting final culture sensitivity prior to calling ID consultation for antibiotic choice and duration recommendation.  Was able to overturn insurance CIR denial at peer to peer phone call on 11/19.   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 Patient's insurance had initially denied admission to rehab.  I did a peer to peer discussion with Dr. Annamaria Boots at [facility], discussed patient's case in detail, updated recent I&D.  She indicated that patient needs complex medical care, needs close daily provider follow-up while undergoing rehab and overturned insurance denial and approved admission to inpatient rehab when medically ready. -Continue pain control.  Pain slightly worsened post I&D. - S/p I&D of left hip abscess on 11/18.  Post procedure, I discussed with Dr. Lyla Glassing.  Empirically started on IV Zosyn and vancomycin pending culture results.  He feels that given her immunocompromise status, she may need to be on long-term antibiotics and recommends ID consultation once final culture results are back.  Surgical JP drain in place-draining thin bloody fluid, volume decreasing. Hopefully can pick antibiotics that can be given across HD.  Preliminary abscess culture showing rare Enterobacter cloaca.  I discussed with Dr. Linus Salmons, ID on 11/20.  He recommends stopping Zosyn and vancomycin and starting cefepime.  He will formally see the patient tomorrow.  Gram stain showed moderate gram-negative rods, rare gram-positive cocci and rare gram-positive rods.  Acute kidney injury on chronic kidney disease, stage V with metabolic acidosis, progressed to ESRD -Patient noted to have volume overload> as evidenced by pitting flank edema -Failed Lasix trial -Was treated on bicarb infusion -Nephrology consulted and following -Status post tunnel  catheter on 01/09/2020 with dialysis starting on 01/10/2020 -Renal navigator assisted with clip.  Patient has been accepted at [facility] kidney center on MWF schedule with a seat time of 12:10 PM. -Management per nephrology.  HD on MWF schedule.  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  Technically will be nonweightbearing for 6 weeks while stump heals.  She can then get a prosthesis. -will need follow up with Dr. Doran Durand in 2 weeks  Constipation/IBS with diarrhea -Likely compounded by narcotics and being bedbound -Lotronex held to prevent further constipation -Continue Dulcolax suppository and bowel regimen with MiraLAX, Colace and senna -was able to have a bowel movement  Mild hyponatremia/hypochloremia -Managed with hemodialysis -improving/stable, continue to monitor   Leukocytosis -Resolved today.  May have been related to left hip abscess.  Follow CBC closely.  Diabetes mellitus, type II -Patient uses Lantus and NovoLog at home -Hemoglobin A1c was 6.2 on 12/29/2019 -Continue Lantus, insulin sliding scale, along with 8 units with meals, and CBG monitoring -CBG control better today, 102 > 128.  Hyperglycemia of yesterday may have been related to stress of infection.  GERD -Continue PPI  Essential hypertension -Continue Coreg, hydralazine, amlodipine -BP stable  Acute blood loss anemia on chronic anemia/anemia of chronic disease -Suspect multifactorial including acute infection, hip fracture, renal disease and I&D on 11/18 -Hemoglobin did drop several times and patient thus far has received 4 units of PRBC as well as Feraheme -Hemoglobin down to 7.3 on 11/19, up to 9.1 after 2 units PRBC 11/19 at HD.  Physical Deconditioning -PT consulted working with patient although at times she has declined due to pain -Unable to obtain home health agency given place of residence [place], [place]) -  Patient's insurance had initially denied admission to rehab.  I did a peer to peer discussion with Dr.  Annamaria Boots at [facility] on 11/19, discussed patient's case in detail, updated recent I&D.  She indicated that patient needs complex medical care, needs close daily provider follow-up while undergoing rehab and thereby overturned insurance denial and approved admission to inpatient rehab when medically ready.  Dr. Annamaria Boots is a rehab MD herself.  Suspected UTI: Foley catheter placed in OR on 11/18 and Dr. Lyla Glassing noted purulent urine, sent for culture and sensitivity.  Empirically started on IV Zosyn.  Foley catheter removed.  I do not see a urine culture result.  Body mass index is 28.09 kg/m.  Nutritional Status Nutrition Problem: Increased nutrient needs Etiology: post-op healing Signs/Symptoms: estimated needs Interventions: MVI, Nepro shake     DVT prophylaxis: enoxaparin (LOVENOX) injection 30 mg Start: 01/20/20 0800 SCDs Start: 01/19/20 1713     Code Status: Full Code Family Communication: None at bedside.  Patient declined providers offered to call family to discuss and update care. Disposition:  Status is: Inpatient  Remains inpatient appropriate because:Inpatient level of care appropriate due to severity of illness   Dispo: The patient is from: Home              Anticipated d/c is to: CIR, hopefully early next week.              Anticipated d/c date is: > 3 days              Patient currently is not medically stable to d/c.  Consultants:   Infectious disease Orthopedic surgery Vascular surgery Nephrology  Procedures:   Left IM nail 12/29/2019 Left BKA 01/12/2020 Tunneled catheter placement on 01/09/2020 Ongoing HD's S/p I&D of left hip abscess in OR on 11/18.  Antimicrobials:    Anti-infectives (From admission, onward)   Start     Dose/Rate Route Frequency Ordered Stop   01/27/20 1200  vancomycin (VANCOCIN) IVPB 1000 mg/200 mL premix        1,000 mg 200 mL/hr over 60 Minutes Intravenous Every M-W-F (Hemodialysis) 01/21/20 0847     01/24/20 1200   vancomycin (VANCOCIN) IVPB 1000 mg/200 mL premix        1,000 mg 200 mL/hr over 60 Minutes Intravenous Once in dialysis 01/21/20 0847     01/22/20 1200  vancomycin (VANCOCIN) IVPB 1000 mg/200 mL premix        1,000 mg 200 mL/hr over 60 Minutes Intravenous Once in dialysis 01/21/20 0847     01/20/20 1400  piperacillin-tazobactam (ZOSYN) IVPB 2.25 g        2.25 g 100 mL/hr over 30 Minutes Intravenous Every 8 hours 01/20/20 0746     01/20/20 1200  vancomycin (VANCOCIN) IVPB 1000 mg/200 mL premix  Status:  Discontinued        1,000 mg 200 mL/hr over 60 Minutes Intravenous Every M-W-F (Hemodialysis) 01/20/20 0746 01/21/20 0847   01/19/20 1230  ceFAZolin (ANCEF) IVPB 2g/100 mL premix        2 g 200 mL/hr over 30 Minutes Intravenous On call to O.R. 01/19/20 1212 01/19/20 1408   01/19/20 0845  piperacillin-tazobactam (ZOSYN) IVPB 2.25 g  Status:  Discontinued        2.25 g 100 mL/hr over 30 Minutes Intravenous Every 6 hours 01/19/20 0750 01/20/20 0746   01/19/20 0845  vancomycin (VANCOREADY) IVPB 2000 mg/400 mL        2,000 mg 200 mL/hr over 120 Minutes Intravenous  Once 01/19/20 0752 01/19/20 1231   01/19/20 0752  vancomycin variable dose per unstable renal function (pharmacist dosing)  Status:  Discontinued         Does not apply See admin instructions 01/19/20 0752 01/20/20 0746   01/12/20 0806  vancomycin (VANCOCIN) powder  Status:  Discontinued          As needed 01/12/20 0806 01/12/20 0946   01/10/20 2000  ceFAZolin (ANCEF) IVPB 1 g/50 mL premix  Status:  Discontinued        1 g 100 mL/hr over 30 Minutes Intravenous Every 24 hours 01/10/20 1343 01/14/20 1311   01/10/20 0600  ceFAZolin (ANCEF) IVPB 2g/100 mL premix        2 g 200 mL/hr over 30 Minutes Intravenous On call to O.R. 01/09/20 1952 01/11/20 0559   01/06/20 0400  ceFAZolin (ANCEF) IVPB 2g/100 mL premix  Status:  Discontinued        2 g 200 mL/hr over 30 Minutes Intravenous Every 12 hours 01/05/20 1709 01/10/20 1343   01/05/20  2200  ceFAZolin (ANCEF) IVPB 2g/100 mL premix  Status:  Discontinued        2 g 200 mL/hr over 30 Minutes Intravenous Every 8 hours 01/05/20 1707 01/05/20 1709   01/04/20 1700  ceFAZolin (ANCEF) IVPB 1 g/50 mL premix  Status:  Discontinued        1 g 100 mL/hr over 30 Minutes Intravenous Every 12 hours 01/04/20 1611 01/05/20 1707   12/29/19 1930  ceFAZolin (ANCEF) IVPB 2g/100 mL premix  2 g 200 mL/hr over 30 Minutes Intravenous Every 6 hours 12/29/19 1620 12/30/19 0201   12/29/19 1300  ceFAZolin (ANCEF) IVPB 2g/100 mL premix        2 g 200 mL/hr over 30 Minutes Intravenous On call to O.R. 12/29/19 0113 12/29/19 1325        Subjective:  States that left hip pain is better and drainage in the JP drain is less.  No other complaints reported.  Objective:   Vitals:   01/20/20 1612 01/20/20 2005 01/21/20 0510 01/21/20 1503  BP: (!) 147/62 (!) 139/52 (!) 146/60 (!) 131/55  Pulse: 72 70 65 65  Resp: 16  18 15   Temp: 98.7 F (37.1 C) 99.1 F (37.3 C) 98.8 F (37.1 C) 97.9 F (36.6 C)  TempSrc: Axillary Oral Oral Oral  SpO2: 99% 98% 100% 99%  Weight:      Height:        General exam: Young female, moderately built and nourished lying comfortably propped up in bed.  Appears to be in good spirits. Respiratory system: Clear to auscultation. Respiratory effort normal.  Right upper anterior chest TDC site appears clean and dry. Cardiovascular system: S1 & S2 heard, RRR. No JVD, murmurs, rubs, gallops or clicks. No pedal edema. Gastrointestinal system: Abdomen is nondistended, soft and nontender. No organomegaly or masses felt. Normal bowel sounds heard. Central nervous system: Alert and oriented. No focal neurological deficits. Extremities: Symmetric 5 x 5 power.  Left BKA dressing site clean and dry.  Has stump protector in place. Skin: No rashes, lesions or ulcers.  Left hip still slowly decreasing induration/edema, faint patchy erythema, small amount of bloody drainage in JP  bulb Psychiatry: Judgement and insight appear normal. Mood & affect appropriate.     Data Reviewed:   I have personally reviewed following labs and imaging studies   CBC: Recent Labs  Lab 01/19/20 1858 01/20/20 0034 01/21/20 0607  WBC 15.5* 10.7* 11.9*  NEUTROABS  --  7.6 7.9*  HGB 7.9* 7.3* 9.1*  HCT 26.9* 24.8* 28.9*  MCV 93.1 92.5 90.9  PLT 283 282 767    Basic Metabolic Panel: Recent Labs  Lab 01/17/20 0613 01/17/20 0613 01/18/20 0605 01/18/20 0605 01/19/20 0746 01/19/20 1858 01/20/20 0952  NA 134*  --  132*  --   --   --  131*  K 4.1  --  4.1  --   --   --  4.2  CL 99  --  98  --   --   --  96*  CO2 26  --  23  --   --   --  24  GLUCOSE 207*  --  178*  --   --   --  234*  BUN 26*  --  37*  --   --   --  34*  CREATININE 3.42*   < > 4.17*   < > 3.41* 4.00* 4.92*  CALCIUM 8.4*  --  8.3*  --   --   --  8.1*  PHOS 3.5  --  4.0  --   --   --  3.7   < > = values in this interval not displayed.    Liver Function Tests: Recent Labs  Lab 01/17/20 0613 01/18/20 0605 01/20/20 0952  ALBUMIN 1.7* 1.7* 1.7*    CBG: Recent Labs  Lab 01/20/20 2054 01/21/20 0810 01/21/20 1219  GLUCAP 240* 102* 128*    Microbiology Studies:   Recent Results (from the past 240  hour(s))  Aerobic/Anaerobic Culture (surgical/deep wound)     Status: None (Preliminary result)   Collection Time: 01/19/20  2:24 PM   Specimen: PATH Other; Tissue  Result Value Ref Range Status   Specimen Description ABSCESS LEFT HIP  Final   Special Requests PT ON ANCEF  Final   Gram Stain   Final    ABUNDANT WBC PRESENT, PREDOMINANTLY PMN MODERATE GRAM NEGATIVE RODS RARE GRAM POSITIVE COCCI RARE GRAM POSITIVE RODS Performed at [facility], [place] [address] [place], [place] [address]    Culture RARE ENTEROBACTER CLOACAE  Final   Report Status PENDING  Incomplete     Radiology Studies:  No results found.   Scheduled Meds:   . (feeding supplement) PROSource Plus  30 mL Oral BID BM   . sodium chloride   Intravenous Once  . amLODipine  10 mg Oral Daily  . atorvastatin  40 mg Oral q1800  . carvedilol  25 mg Oral BID WC  . Chlorhexidine Gluconate Cloth  6 each Topical Daily  . [START ON 01/27/2020] darbepoetin (ARANESP) injection - DIALYSIS  100 mcg Intravenous Q Fri-HD  . dexlansoprazole  60 mg Oral Daily  . docusate sodium  100 mg Oral BID  . DULoxetine  60 mg Oral Daily  . enoxaparin (LOVENOX) injection  30 mg Subcutaneous Q24H  . hydrALAZINE  100 mg Oral TID  . insulin aspart  0-5 Units Subcutaneous QHS  . insulin aspart  0-9 Units Subcutaneous TID WC  . insulin aspart  8 Units Subcutaneous TID WC  . insulin glargine  38 Units Subcutaneous QHS  . multivitamin  1 tablet Oral QHS  . polyethylene glycol  17 g Oral Daily  . senna  1 tablet Oral BID  . sevelamer carbonate  800 mg Oral TID WC    Continuous Infusions:   . sodium chloride Stopped (01/09/20 0759)  . sodium chloride 10 mL/hr at 01/10/20 1510  . sodium chloride    . sodium chloride    . piperacillin-tazobactam (ZOSYN)  IV 2.25 g (01/21/20 1347)  . [START ON 01/22/2020] vancomycin    . [START ON 01/27/2020] vancomycin    . [START ON 01/24/2020] vancomycin       LOS: 24 days     Vernell Leep, MD, [place], [facility]. [facility]    To contact the attending provider between 7A-7P or the covering provider during after hours 7P-7A, please log into the web site [email or web address] and access using universal [place] password for that web site. If you do not have the password, please call the hospital operator.  01/21/2020, 3:06 PM

## 2020-01-21 NOTE — Progress Notes (Signed)
Pt prefers all four siderails up on bed for comfort and safety.

## 2020-01-21 NOTE — Progress Notes (Addendum)
Physical Therapy Treatment Patient Details Name: [name] MRN: [id number] DOB: 1965-09-17 Today's Date: 01/21/2020    History of Present Illness 54 year old female with DM, HTN, CKD 5, status post left foot Chopart amputation 2014 managed by podiatry, left SFA vascularization in 2020, prior CVA who came into the hospital after a ground-level fall resulting in left hip fracture,   S/P Intramedullary fixation, Left femur on 10/28. 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    PT Comments    Pt supine in bed on arrival this session.  Pt tolerated session well this pm.  Pt required encouragement to attempt OOB to standing transfer and pleasantly surprised her self with her ability.  Pt continues to be an excellent candidate for aggressive rehab in a post acute setting.     Follow Up Recommendations  CIR;Supervision/Assistance - 24 hour     Equipment Recommendations  Wheelchair (measurements PT);Wheelchair cushion (measurements PT);Rolling walker with 5" wheels;3in1 (PT)    Recommendations for Other Services       Precautions / Restrictions Precautions Precautions: Fall Precaution Comments: new L BKA Required Braces or Orthoses: Other Brace (LLE limb guard) Restrictions Weight Bearing Restrictions: Yes LLE Weight Bearing: Non weight bearing    Mobility  Bed Mobility Overal bed mobility: Needs Assistance Bed Mobility: Supine to Sit     Supine to sit: Mod assist     General bed mobility comments: Assistance with LLE to edge of bed and for trunk elevation.  Once sitting edge of bed she was able to maintain her balance.  Transfers Overall transfer level: Needs assistance Equipment used: Rolling walker (2 wheeled) Transfers: Sit to/from Stand Sit to Stand: Mod assist;+2 physical assistance         General transfer comment: Pt stood x 1.5 min edge of bed with mod +1 to maintain.  Performed additional trial mod +2 to perform pericare.  Pt able to pivot on R foot to move up to  [facility].  Ambulation/Gait Ambulation/Gait assistance:  (NT)               Stairs             Wheelchair Mobility    Modified Rankin (Stroke Patients Only)       Balance Overall balance assessment: Needs assistance;History of Falls Sitting-balance support: Bilateral upper extremity supported Sitting balance-Leahy Scale: Fair       Standing balance-Leahy Scale: Poor                              Cognition Arousal/Alertness: Awake/alert Behavior During Therapy: Anxious;WFL for tasks assessed/performed Overall Cognitive Status: Within Functional Limits for tasks assessed                                 General Comments: Pt required encouragement to redirect to progression of activity.      Exercises      General Comments        Pertinent Vitals/Pain Pain Assessment: 0-10 Pain Score: 8  Pain Location: L hip Pain Descriptors / Indicators: Guarding;Grimacing;Sore;Constant Pain Intervention(s): Monitored during session;Repositioned    Home Living                      Prior Function            PT Goals (current goals can now be found in the care plan section)  Acute Rehab PT Goals Patient Stated Goal: to reduce pain Additional Goals Additional Goal #1: set up and propel WC with min assist x 50' Progress towards PT goals: Progressing toward goals    Frequency    Min 3X/week      PT Plan Current plan remains appropriate    Co-evaluation              AM-PAC PT "6 Clicks" Mobility   Outcome Measure  Help needed turning from your back to your side while in a flat bed without using bedrails?: A Little Help needed moving from lying on your back to sitting on the side of a flat bed without using bedrails?: A Lot Help needed moving to and from a bed to a chair (including a wheelchair)?: A Lot Help needed standing up from a chair using your arms (e.g., wheelchair or bedside chair)?: A Lot Help needed to walk  in hospital room?: Total Help needed climbing 3-5 steps with a railing? : Total 6 Click Score: 11    End of Session Equipment Utilized During Treatment: Gait belt Activity Tolerance: Patient tolerated treatment well Patient left: in bed;with call bell/phone within reach;with family/visitor present (sitting edge of bed with daughter, RN aware.) Nurse Communication: Mobility status PT Visit Diagnosis: Muscle weakness (generalized) (M62.81);Other abnormalities of gait and mobility (R26.89) Pain - Right/Left: Left Pain - part of body: Hip     Time: 0388-8280 PT Time Calculation (min) (ACUTE ONLY): 23 min  Charges:  $Therapeutic Activity: 23-37 mins                     Erasmo Leventhal , PTA Acute Rehabilitation Services Pager [phone] Office [phone]     Evangelyn Crouse Eli Hose 01/21/2020, 4:36 PM

## 2020-01-21 NOTE — Progress Notes (Signed)
    Subjective:  Patient reports pain as mild to moderate.  Denies N/V/CP/SOB. No c/o currently  Objective:   VITALS:   Vitals:   01/20/20 1400 01/20/20 1612 01/20/20 2005 01/21/20 0510  BP: (!) 143/54 (!) 147/62 (!) 139/52 (!) 146/60  Pulse: 68 72 70 65  Resp: 17 16  18   Temp: 99.1 F (37.3 C) 98.7 F (37.1 C) 99.1 F (37.3 C) 98.8 F (37.1 C)  TempSrc: Oral Axillary Oral Oral  SpO2: 98% 99% 98% 100%  Weight: 88.8 kg     Height:      JP: 30 cc last shift   NAD ABD soft Incision: dressing C/D/I Compartment soft Cellulitis improved Stump protector in place JP ss   Lab Results  Component Value Date   WBC 11.9 (H) 01/21/2020   HGB 9.1 (L) 01/21/2020   HCT 28.9 (L) 01/21/2020   MCV 90.9 01/21/2020   PLT 265 01/21/2020   BMET    Component Value Date/Time   NA 131 (L) 01/20/2020 0952   NA 140 10/29/2018 1439   K 4.2 01/20/2020 0952   CL 96 (L) 01/20/2020 0952   CO2 24 01/20/2020 0952   GLUCOSE 234 (H) 01/20/2020 0952   BUN 34 (H) 01/20/2020 0952   BUN 43 (H) 10/29/2018 1439   CREATININE 4.92 (H) 01/20/2020 0952   CREATININE 1.92 (H) 01/10/2016 1528   CALCIUM 8.1 (L) 01/20/2020 0952   CALCIUM 8.3 (L) 01/11/2020 0139   GFRNONAA 10 (L) 01/20/2020 0952   GFRNONAA 30 (L) 01/10/2016 1528   GFRAA 13 (L) 12/17/2018 0907   GFRAA 35 (L) 01/10/2016 1528    Recent Results (from the past 240 hour(s))  Aerobic/Anaerobic Culture (surgical/deep wound)     Status: None (Preliminary result)   Collection Time: 01/19/20  2:24 PM   Specimen: PATH Other; Tissue  Result Value Ref Range Status   Specimen Description ABSCESS LEFT HIP  Final   Special Requests PT ON ANCEF  Final   Gram Stain   Final    ABUNDANT WBC PRESENT, PREDOMINANTLY PMN MODERATE GRAM NEGATIVE RODS RARE GRAM POSITIVE COCCI RARE GRAM POSITIVE RODS    Culture   Final    NO GROWTH < 12 HOURS Performed at [facility], 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)   BKA stump complication (HCC)   Hyponatremia   Constipation   Post-op pain   WBAT with walker DVT ppx: Lovenox, SCDs, TEDS PO pain control PT/OT L hip abscess: cont renally dosed vanc and zosyn for now, follow intraop cultures NGTD Dispo: cont JP drain, will need ID consult once cultures are back   Bertram Savin 01/21/2020, 8:45 AM   Rod Can, MD [phone] [facility] is now [facility]  [place] [address], [address], [place], [place] [address] Phone: [phone] [email or web address] Facebook  [employer]

## 2020-01-22 DIAGNOSIS — L97524 Non-pressure chronic ulcer of other part of left foot with necrosis of bone: Secondary | ICD-10-CM | POA: Diagnosis not present

## 2020-01-22 DIAGNOSIS — B9689 Other specified bacterial agents as the cause of diseases classified elsewhere: Secondary | ICD-10-CM

## 2020-01-22 DIAGNOSIS — D631 Anemia in chronic kidney disease: Secondary | ICD-10-CM

## 2020-01-22 DIAGNOSIS — M8610 Other acute osteomyelitis, unspecified site: Secondary | ICD-10-CM | POA: Diagnosis not present

## 2020-01-22 DIAGNOSIS — L7682 Other postprocedural complications of skin and subcutaneous tissue: Secondary | ICD-10-CM | POA: Diagnosis not present

## 2020-01-22 DIAGNOSIS — N185 Chronic kidney disease, stage 5: Secondary | ICD-10-CM | POA: Diagnosis not present

## 2020-01-22 DIAGNOSIS — M00852 Arthritis due to other bacteria, left hip: Secondary | ICD-10-CM | POA: Diagnosis not present

## 2020-01-22 DIAGNOSIS — E1169 Type 2 diabetes mellitus with other specified complication: Secondary | ICD-10-CM | POA: Diagnosis not present

## 2020-01-22 DIAGNOSIS — G8918 Other acute postprocedural pain: Secondary | ICD-10-CM | POA: Diagnosis not present

## 2020-01-22 DIAGNOSIS — Z89512 Acquired absence of left leg below knee: Secondary | ICD-10-CM | POA: Diagnosis not present

## 2020-01-22 DIAGNOSIS — M86672 Other chronic osteomyelitis, left ankle and foot: Secondary | ICD-10-CM | POA: Diagnosis not present

## 2020-01-22 DIAGNOSIS — K5900 Constipation, unspecified: Secondary | ICD-10-CM | POA: Diagnosis not present

## 2020-01-22 DIAGNOSIS — N186 End stage renal disease: Secondary | ICD-10-CM

## 2020-01-22 DIAGNOSIS — N179 Acute kidney failure, unspecified: Secondary | ICD-10-CM | POA: Diagnosis not present

## 2020-01-22 DIAGNOSIS — E1122 Type 2 diabetes mellitus with diabetic chronic kidney disease: Secondary | ICD-10-CM

## 2020-01-22 DIAGNOSIS — E8770 Fluid overload, unspecified: Secondary | ICD-10-CM | POA: Diagnosis not present

## 2020-01-22 DIAGNOSIS — T148XXA Other injury of unspecified body region, initial encounter: Secondary | ICD-10-CM | POA: Diagnosis not present

## 2020-01-22 DIAGNOSIS — E871 Hypo-osmolality and hyponatremia: Secondary | ICD-10-CM | POA: Diagnosis not present

## 2020-01-22 DIAGNOSIS — S72142D Displaced intertrochanteric fracture of left femur, subsequent encounter for closed fracture with routine healing: Secondary | ICD-10-CM

## 2020-01-22 DIAGNOSIS — T879 Unspecified complications of amputation stump: Secondary | ICD-10-CM | POA: Diagnosis not present

## 2020-01-22 DIAGNOSIS — D649 Anemia, unspecified: Secondary | ICD-10-CM | POA: Diagnosis not present

## 2020-01-22 DIAGNOSIS — I120 Hypertensive chronic kidney disease with stage 5 chronic kidney disease or end stage renal disease: Secondary | ICD-10-CM | POA: Diagnosis not present

## 2020-01-22 DIAGNOSIS — E1129 Type 2 diabetes mellitus with other diabetic kidney complication: Secondary | ICD-10-CM | POA: Diagnosis not present

## 2020-01-22 LAB — CBC WITH DIFFERENTIAL/PLATELET
Abs Immature Granulocytes: 0.21 10*3/uL — ABNORMAL HIGH (ref 0.00–0.07)
Basophils Absolute: 0.1 10*3/uL (ref 0.0–0.1)
Basophils Relative: 1 %
Eosinophils Absolute: 0.3 10*3/uL (ref 0.0–0.5)
Eosinophils Relative: 3 %
HCT: 29 % — ABNORMAL LOW (ref 36.0–46.0)
Hemoglobin: 9.1 g/dL — ABNORMAL LOW (ref 12.0–15.0)
Immature Granulocytes: 2 %
Lymphocytes Relative: 14 %
Lymphs Abs: 1.5 10*3/uL (ref 0.7–4.0)
MCH: 28.6 pg (ref 26.0–34.0)
MCHC: 31.4 g/dL (ref 30.0–36.0)
MCV: 91.2 fL (ref 80.0–100.0)
Monocytes Absolute: 1.8 10*3/uL — ABNORMAL HIGH (ref 0.1–1.0)
Monocytes Relative: 17 %
Neutro Abs: 6.7 10*3/uL (ref 1.7–7.7)
Neutrophils Relative %: 63 %
Platelets: 255 10*3/uL (ref 150–400)
RBC: 3.18 MIL/uL — ABNORMAL LOW (ref 3.87–5.11)
RDW: 15.2 % (ref 11.5–15.5)
WBC: 10.6 10*3/uL — ABNORMAL HIGH (ref 4.0–10.5)
nRBC: 0 % (ref 0.0–0.2)

## 2020-01-22 LAB — RENAL FUNCTION PANEL
Albumin: 1.7 g/dL — ABNORMAL LOW (ref 3.5–5.0)
Anion gap: 11 (ref 5–15)
BUN: 34 mg/dL — ABNORMAL HIGH (ref 6–20)
CO2: 24 mmol/L (ref 22–32)
Calcium: 8.4 mg/dL — ABNORMAL LOW (ref 8.9–10.3)
Chloride: 95 mmol/L — ABNORMAL LOW (ref 98–111)
Creatinine, Ser: 5.41 mg/dL — ABNORMAL HIGH (ref 0.44–1.00)
GFR, Estimated: 9 mL/min — ABNORMAL LOW (ref >60)
Glucose, Bld: 135 mg/dL — ABNORMAL HIGH (ref 70–99)
Phosphorus: 3.4 mg/dL (ref 2.5–4.6)
Potassium: 3.8 mmol/L (ref 3.5–5.1)
Sodium: 130 mmol/L — ABNORMAL LOW (ref 135–145)

## 2020-01-22 LAB — GLUCOSE, CAPILLARY
Glucose-Capillary: 145 mg/dL — ABNORMAL HIGH (ref 70–99)
Glucose-Capillary: 92 mg/dL (ref 70–99)
Glucose-Capillary: 93 mg/dL (ref 70–99)
Glucose-Capillary: 120 mg/dL — ABNORMAL HIGH (ref 70–99)

## 2020-01-22 LAB — AEROBIC/ANAEROBIC CULTURE W GRAM STAIN (SURGICAL/DEEP WOUND)

## 2020-01-22 MED ORDER — HEPARIN SODIUM (PORCINE) 1000 UNIT/ML IJ SOLN
INTRAMUSCULAR | Status: AC
  Filled 2020-01-22: qty 4

## 2020-01-22 NOTE — Progress Notes (Addendum)
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  Initially admitted for left hip fracture and underwent surgery on 12/29/2019.  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  Nephrology consulted, tunneled HD catheter placed 01/09/2020 and undergoing regular HD.  Consulted orthopedics/Dr. Lyla Glassing again on 11/18 due to reported purulent drainage from left hip surgical site.  S/p I&D of left hip abscess in OR.  Awaiting final culture sensitivity prior to calling ID consultation for antibiotic choice and duration recommendation.  Dr Algis Liming was able to overturn insurance CIR denial at peer to peer phone call on 11/19.   Subjective: Has issues with ongoing pain. We had a discussion about how to use her pain medications to maintain steady pain control.     Assessment & Plan:   Principal Problem:   Left intertrochanteric Fracture with post op hip wound infection - s/p IM nail on 10/28 - noted to have purulent drainage from the left hip and underwent I and D of the hip on 11/18 - - has JP drain - cultures growing Enterobacter Cloacae- appreciate ID assistance with antibiotics- recommending prolonged course of Cefepime (4 wks) with dialysis  Active Problems: AKI on CKD 5 with metabolic acidosis- now ESRD - with fluid overload - failed lasix in hospital - treated with Bicarb  infusion -  S/p tunneled cath on 11/8 and started dialysis on 11/9 - she has been accepted to [facility] for MWF schedule at 12:10  Left foot abscess and osteomyelitis- - 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- - stump protector placed- can get prosthesis and become weight bearing 6 wks after surgery - f/u with Dr Doran Durand in 2 wks  Constipation- h/o IBS with diarrhea - due to narcotics and being bed bound - Holding Lotronex to prevent further constipation - cont laxatives   Diabetes mellitus type 2 - she takes Lantus and Novolog at home  - Fasting sugar controlled on 38 U Lantus- she takes 50 U of Lantus a day at home - cont Novolog 8 U with meals + SSI - A1c on 10/28 was 6.2  GERD - cont Dexilant  Essential HTN - cont Carvedilol, Hydralazine and Amlodipine     Acute on chronic anemia - likely due to hip fracture and acute infection - Hb dropped to 6.3 on 10/27- she received 2 U PRBC - 11/2- 1 U PRBC for Hb of 7.2 11/6 1 U PRBC for Hb of 7.3 - given Feraheme on 11/6 - given 2 U PRBC on 11/19  UA + for infection on 11/18 - purulent urine noted in foley by Dr Lyla Glassing- foley removed  - no urine culture sent- follow for symptoms  Time spent in minutes: 35 DVT prophylaxis: enoxaparin (LOVENOX) injection 30 mg Start: 01/20/20 0800 SCDs Start: 01/19/20 1713  Code Status: Full code Family Communication:  Disposition Plan:  Status is: Inpatient  Remains inpatient appropriate because:treating acute infection  Dispo: The patient is from: Home              Anticipated d/c is to: CIR              Anticipated d/c date is: > 3 days              Patient currently is not medically stable to d/c.      Consultants:  Infectious disease Orthopedic surgery Vascular surgery Nephrology  Procedures:  Left IM nail 12/29/2019 Left BKA 01/12/2020 Tunneled catheter placement on 01/09/2020 Ongoing HD's S/p I&D of left hip abscess in OR on 11/18.  Antimicrobials:   Anti-infectives (From admission, onward)   Start     Dose/Rate Route Frequency Ordered Stop   01/27/20 1200  vancomycin (VANCOCIN) IVPB 1000 mg/200 mL premix  Status:  Discontinued        1,000 mg 200 mL/hr over 60 Minutes Intravenous Every M-W-F (Hemodialysis) 01/21/20 0847 01/21/20 1520   01/24/20 1200  vancomycin (VANCOCIN) IVPB 1000 mg/200 mL premix  Status:  Discontinued        1,000 mg 200 mL/hr over 60 Minutes Intravenous Once in dialysis 01/21/20 0847 01/21/20 1520   01/22/20 1200  vancomycin (VANCOCIN) IVPB 1000 mg/200 mL premix  Status:  Discontinued        1,000 mg 200 mL/hr over 60 Minutes Intravenous Once in dialysis 01/21/20 0847 01/21/20 1520   01/21/20 1800  ceFEPIme (MAXIPIME) 1 g in sodium chloride 0.9 % 100 mL IVPB        1 g 200 mL/hr over 30 Minutes Intravenous Every 24 hours 01/21/20 1528     01/20/20 1400  piperacillin-tazobactam (ZOSYN) IVPB 2.25 g  Status:  Discontinued        2.25 g 100 mL/hr over 30 Minutes Intravenous Every 8 hours 01/20/20 0746 01/21/20 1520   01/20/20 1200  vancomycin (VANCOCIN) IVPB 1000 mg/200 mL premix  Status:  Discontinued        1,000 mg 200 mL/hr over 60 Minutes Intravenous Every M-W-F (Hemodialysis) 01/20/20 0746 01/21/20 0847   01/19/20 1230  ceFAZolin (ANCEF) IVPB 2g/100 mL premix        2 g 200 mL/hr over 30 Minutes Intravenous On call to O.R. 01/19/20 1212 01/19/20 1408   01/19/20 0845  piperacillin-tazobactam (ZOSYN) IVPB 2.25 g  Status:  Discontinued        2.25 g 100 mL/hr over 30 Minutes Intravenous Every 6 hours 01/19/20 0750 01/20/20 0746   01/19/20 0845  vancomycin (VANCOREADY) IVPB 2000 mg/400 mL        2,000 mg 200 mL/hr over 120 Minutes Intravenous  Once 01/19/20 0752 01/19/20 1231   01/19/20 0752  vancomycin variable dose per unstable renal function (pharmacist dosing)  Status:  Discontinued         Does not apply See admin instructions 01/19/20 0752 01/20/20 0746   01/12/20 0806  vancomycin (VANCOCIN) powder   Status:  Discontinued          As needed 01/12/20 0806 01/12/20 0946   01/10/20 2000  ceFAZolin (ANCEF) IVPB 1 g/50 mL premix  Status:  Discontinued        1 g 100 mL/hr over 30 Minutes Intravenous Every 24 hours 01/10/20 1343 01/14/20 1311   01/10/20 0600  ceFAZolin (ANCEF) IVPB 2g/100 mL premix        2 g 200 mL/hr over 30 Minutes Intravenous On call to O.R. 01/09/20 1952 01/11/20 0559   01/06/20 0400  ceFAZolin (ANCEF) IVPB 2g/100  mL premix  Status:  Discontinued        2 g 200 mL/hr over 30 Minutes Intravenous Every 12 hours 01/05/20 1709 01/10/20 1343   01/05/20 2200  ceFAZolin (ANCEF) IVPB 2g/100 mL premix  Status:  Discontinued        2 g 200 mL/hr over 30 Minutes Intravenous Every 8 hours 01/05/20 1707 01/05/20 1709   01/04/20 1700  ceFAZolin (ANCEF) IVPB 1 g/50 mL premix  Status:  Discontinued        1 g 100 mL/hr over 30 Minutes Intravenous Every 12 hours 01/04/20 1611 01/05/20 1707   12/29/19 1930  ceFAZolin (ANCEF) IVPB 2g/100 mL premix        2 g 200 mL/hr over 30 Minutes Intravenous Every 6 hours 12/29/19 1620 12/30/19 0201   12/29/19 1300  ceFAZolin (ANCEF) IVPB 2g/100 mL premix        2 g 200 mL/hr over 30 Minutes Intravenous On call to O.R. 12/29/19 0113 12/29/19 1325       Objective: Vitals:   01/22/20 0418 01/22/20 0500 01/22/20 0921 01/22/20 1254  BP: (!) 129/51  (!) 132/51 122/62  Pulse: 61  61 60  Resp: 16  16 16   Temp: 98.1 F (36.7 C)  98.2 F (36.8 C) 98.2 F (36.8 C)  TempSrc: Oral  Oral Oral  SpO2: 99%  98% 99%  Weight:  94 kg    Height:        Intake/Output Summary (Last 24 hours) at 01/22/2020 1418 Last data filed at 01/22/2020 0324 Gross per 24 hour  Intake 640 ml  Output 85 ml  Net 555 ml   Filed Weights   01/20/20 0955 01/20/20 1400 01/22/20 0500  Weight: 91.7 kg 88.8 kg 94 kg    Examination: General exam: Appears uncomfortable  HEENT: PERRLA, oral mucosa moist, no sclera icterus or thrush Respiratory system: Clear to  auscultation. Respiratory effort normal. Cardiovascular system: S1 & S2 heard, RRR.   Gastrointestinal system: Abdomen soft, non-tender, nondistended. Normal bowel sounds. Central nervous system: Alert and oriented. No focal neurological deficits. Extremities: No cyanosis, clubbing or edema- drain in left hip with bloody drainage- left BKA Skin: No rashes or ulcers Psychiatry:  Mood & affect appropriate.     Data Reviewed: I have personally reviewed following labs and imaging studies  CBC: Recent Labs  Lab 01/19/20 0746 01/19/20 1858 01/20/20 0034 01/21/20 0607 01/22/20 0317  WBC 10.0 15.5* 10.7* 11.9* 10.6*  NEUTROABS  --   --  7.6 7.9* 6.7  HGB 7.7* 7.9* 7.3* 9.1* 9.1*  HCT 25.3* 26.9* 24.8* 28.9* 29.0*  MCV 91.7 93.1 92.5 90.9 91.2  PLT 296 283 282 265 283   Basic Metabolic Panel: Recent Labs  Lab 01/16/20 1130 01/16/20 1130 01/17/20 0613 01/18/20 0605 01/19/20 0746 01/19/20 1858 01/20/20 0952  NA 134*  --  134* 132*  --   --  131*  K 4.1  --  4.1 4.1  --   --  4.2  CL 98  --  99 98  --   --  96*  CO2 24  --  26 23  --   --  24  GLUCOSE 91  --  207* 178*  --   --  234*  BUN 52*  --  26* 37*  --   --  34*  CREATININE 5.11*   < > 3.42* 4.17* 3.41* 4.00* 4.92*  CALCIUM 8.4*  --  8.4* 8.3*  --   --  8.1*  PHOS 4.2  --  3.5 4.0  --   --  3.7   < > = values in this interval not displayed.   GFR: Estimated Creatinine Clearance: 16.6 mL/min (A) (by C-G formula based on SCr of 4.92 mg/dL (H)). Liver Function Tests: Recent Labs  Lab 01/16/20 1130 01/17/20 0613 01/18/20 0605 01/20/20 0952  ALBUMIN 1.7* 1.7* 1.7* 1.7*   No results for input(s): LIPASE, AMYLASE in the last 168 hours. No results for input(s): AMMONIA in the last 168 hours. Coagulation Profile: No results for input(s): INR, PROTIME in the last 168 hours. Cardiac Enzymes: No results for input(s): CKTOTAL, CKMB, CKMBINDEX, TROPONINI in the last 168 hours. BNP (last 3 results) No results for  input(s): PROBNP in the last 8760 hours. HbA1C: No results for input(s): HGBA1C in the last 72 hours. CBG: Recent Labs  Lab 01/21/20 1219 01/21/20 1702 01/21/20 2124 01/22/20 0729 01/22/20 1211  GLUCAP 128* 110* 257* 145* 92   Lipid Profile: No results for input(s): CHOL, HDL, LDLCALC, TRIG, CHOLHDL, LDLDIRECT in the last 72 hours. Thyroid Function Tests: No results for input(s): TSH, T4TOTAL, FREET4, T3FREE, THYROIDAB in the last 72 hours. Anemia Panel: No results for input(s): VITAMINB12, FOLATE, FERRITIN, TIBC, IRON, RETICCTPCT in the last 72 hours. Urine analysis:    Component Value Date/Time   COLORURINE YELLOW 01/19/2020 1402   APPEARANCEUR TURBID (A) 01/19/2020 1402   LABSPEC 1.010 01/19/2020 1402   PHURINE 6.0 01/19/2020 1402   GLUCOSEU 50 (A) 01/19/2020 1402   GLUCOSEU NEGATIVE 03/29/2014 1259   HGBUR NEGATIVE 01/19/2020 1402   BILIRUBINUR NEGATIVE 01/19/2020 1402   BILIRUBINUR neg 02/16/2017 1501   KETONESUR NEGATIVE 01/19/2020 1402   PROTEINUR 100 (A) 01/19/2020 1402   UROBILINOGEN 0.2 02/16/2017 1501   UROBILINOGEN 0.2 03/29/2014 1259   NITRITE NEGATIVE 01/19/2020 1402   LEUKOCYTESUR TRACE (A) 01/19/2020 1402   Sepsis Labs: @LABRCNTIP (procalcitonin:4,lacticidven:4) ) Recent Results (from the past 240 hour(s))  Aerobic/Anaerobic Culture (surgical/deep wound)     Status: None   Collection Time: 01/19/20  2:24 PM   Specimen: PATH Other; Tissue  Result Value Ref Range Status   Specimen Description ABSCESS LEFT HIP  Final   Special Requests PT ON ANCEF  Final   Gram Stain   Final    ABUNDANT WBC PRESENT, PREDOMINANTLY PMN MODERATE GRAM NEGATIVE RODS RARE GRAM POSITIVE COCCI RARE GRAM POSITIVE RODS    Culture   Final    RARE ENTEROBACTER CLOACAE MODERATE BACTEROIDES FRAGILIS BETA LACTAMASE POSITIVE Performed at [facility], [place] [address], [place], [place] [address]    Report Status 01/22/2020 FINAL  Final   Organism ID, Bacteria ENTEROBACTER  CLOACAE  Final      Susceptibility   Enterobacter cloacae - MIC*    CEFAZOLIN >=64 RESISTANT Resistant     CEFEPIME 0.25 SENSITIVE Sensitive     CEFTAZIDIME 8 SENSITIVE Sensitive     CIPROFLOXACIN <=0.25 SENSITIVE Sensitive     GENTAMICIN <=1 SENSITIVE Sensitive     IMIPENEM <=0.25 SENSITIVE Sensitive     TRIMETH/SULFA <=20 SENSITIVE Sensitive     PIP/TAZO 32 INTERMEDIATE Intermediate     * RARE ENTEROBACTER CLOACAE         Radiology Studies: No results found.    Scheduled Meds: . (feeding supplement) PROSource Plus  30 mL Oral BID BM  . sodium chloride   Intravenous Once  . amLODipine  10 mg Oral Daily  . atorvastatin  40 mg Oral q1800  . carvedilol  25  mg Oral BID WC  . Chlorhexidine Gluconate Cloth  6 each Topical Daily  . [START ON 01/27/2020] darbepoetin (ARANESP) injection - DIALYSIS  100 mcg Intravenous Q Fri-HD  . dexlansoprazole  60 mg Oral Daily  . docusate sodium  100 mg Oral BID  . DULoxetine  60 mg Oral Daily  . enoxaparin (LOVENOX) injection  30 mg Subcutaneous Q24H  . hydrALAZINE  100 mg Oral TID  . insulin aspart  0-5 Units Subcutaneous QHS  . insulin aspart  0-9 Units Subcutaneous TID WC  . insulin aspart  8 Units Subcutaneous TID WC  . insulin glargine  38 Units Subcutaneous QHS  . multivitamin  1 tablet Oral QHS  . polyethylene glycol  17 g Oral Daily  . senna  1 tablet Oral BID  . sevelamer carbonate  800 mg Oral TID WC   Continuous Infusions: . sodium chloride Stopped (01/09/20 0759)  . sodium chloride 10 mL/hr at 01/10/20 1510  . sodium chloride    . sodium chloride    . ceFEPime (MAXIPIME) IV Stopped (01/21/20 1958)     LOS: 25 days      Debbe Odea, MD [facility] Pager: [email or web address] 01/22/2020, 2:18 PM

## 2020-01-22 NOTE — Plan of Care (Signed)
Patient returned from dialysis. Transfer report received from hemodialysis.Pain control remains a concern. Tolerating po cc diet of choice.  Problem: Health Behavior/Discharge Planning: Goal: Ability to manage health-related needs will improve Outcome: Progressing   Problem: Clinical Measurements: Goal: Ability to maintain clinical measurements within normal limits will improve Outcome: Progressing Goal: Will remain free from infection Outcome: Progressing Goal: Diagnostic test results will improve Outcome: Progressing Goal: Respiratory complications will improve Outcome: Progressing Goal: Cardiovascular complication will be avoided Outcome: Progressing   Problem: Activity: Goal: Risk for activity intolerance will decrease Outcome: Progressing   Problem: Nutrition: Goal: Adequate nutrition will be maintained Outcome: Progressing   Problem: Coping: Goal: Level of anxiety will decrease Outcome: Progressing   Problem: Elimination: Goal: Will not experience complications related to bowel motility Outcome: Progressing Goal: Will not experience complications related to urinary retention Outcome: Progressing   Problem: Pain Managment: Goal: General experience of comfort will improve Outcome: Progressing   Problem: Safety: Goal: Ability to remain free from injury will improve Outcome: Progressing   Problem: Skin Integrity: Goal: Risk for impaired skin integrity will decrease Outcome: Progressing   Problem: Education: Goal: Verbalization of understanding the information provided (i.e., activity precautions, restrictions, etc) will improve Outcome: Progressing Goal: Individualized Educational Video(s) Outcome: Progressing   Problem: Activity: Goal: Ability to ambulate and perform ADLs will improve Outcome: Progressing   Problem: Clinical Measurements: Goal: Postoperative complications will be avoided or minimized Outcome: Progressing   Problem: Self-Concept: Goal:  Ability to maintain and perform role responsibilities to the fullest extent possible will improve Outcome: Progressing   Problem: Pain Management: Goal: Pain level will decrease Outcome: Progressing   Problem: Education: Goal: Knowledge of disease and its progression will improve Outcome: Progressing   Problem: Health Behavior/Discharge Planning: Goal: Ability to manage health-related needs will improve Outcome: Progressing   Problem: Clinical Measurements: Goal: Complications related to the disease process or treatment will be avoided or minimized Outcome: Progressing Goal: Dialysis access will remain free of complications Outcome: Progressing   Problem: Activity: Goal: Activity intolerance will improve Outcome: Progressing   Problem: Fluid Volume: Goal: Fluid volume balance will be maintained or improved Outcome: Progressing   Problem: Nutritional: Goal: Ability to make appropriate dietary choices will improve Outcome: Progressing   Problem: Respiratory: Goal: Respiratory symptoms related to disease process will be avoided Outcome: Progressing   Problem: Self-Concept: Goal: Body image disturbance will be avoided or minimized Outcome: Progressing   Problem: Urinary Elimination: Goal: Progression of disease will be identified and treated Outcome: Progressing

## 2020-01-22 NOTE — Progress Notes (Signed)
[name] Progress Note   Subjective:   Reports she had constipation for several days but large BM today and yesterday, stomach still feels a bit upset. Otherwise feeling well, no SOB, CP, dizziness.   Objective Vitals:   01/21/20 1503 01/21/20 2123 01/22/20 0418 01/22/20 0500  BP: (!) 131/55 (!) 109/47 (!) 129/51   Pulse: 65 63 61   Resp: '15 17 16   ' Temp: 97.9 F (36.6 C) 98.6 F (37 C) 98.1 F (36.7 C)   TempSrc: Oral Oral Oral   SpO2: 99% 97% 99%   Weight:    94 kg  Height:       Physical Exam General: Well developed female, alert and in NAD Heart: RRR, no murmurs, rubs or gallops Lungs: CTA bilaterally without wheezing, rhonchi or rales Abdomen: Soft, non-tender, non-distended, +BS Extremities: trace edema RLE Dialysis Access: R IJ TDC, LUE AVF + thrill/bruit   Additional Objective Labs: Basic Metabolic Panel: Recent Labs  Lab 01/17/20 0613 01/17/20 0613 01/18/20 0605 01/18/20 0605 01/19/20 0746 01/19/20 1858 01/20/20 0952  NA 134*  --  132*  --   --   --  131*  K 4.1  --  4.1  --   --   --  4.2  CL 99  --  98  --   --   --  96*  CO2 26  --  23  --   --   --  24  GLUCOSE 207*  --  178*  --   --   --  234*  BUN 26*  --  37*  --   --   --  34*  CREATININE 3.42*   < > 4.17*   < > 3.41* 4.00* 4.92*  CALCIUM 8.4*  --  8.3*  --   --   --  8.1*  PHOS 3.5  --  4.0  --   --   --  3.7   < > = values in this interval not displayed.   Liver Function Tests: Recent Labs  Lab 01/17/20 1975 01/18/20 0605 01/20/20 0952  ALBUMIN 1.7* 1.7* 1.7*   CBC: Recent Labs  Lab 01/19/20 0746 01/19/20 0746 01/19/20 1858 01/19/20 1858 01/20/20 0034 01/21/20 0607 01/22/20 0317  WBC 10.0   < > 15.5*   < > 10.7* 11.9* 10.6*  NEUTROABS  --   --   --   --  7.6 7.9* 6.7  HGB 7.7*   < > 7.9*   < > 7.3* 9.1* 9.1*  HCT 25.3*   < > 26.9*   < > 24.8* 28.9* 29.0*  MCV 91.7  --  93.1  --  92.5 90.9 91.2  PLT 296   < > 283   < > 282 265 255   < > = values in  this interval not displayed.   Blood Culture    Component Value Date/Time   SDES ABSCESS LEFT HIP 01/19/2020 1424   SPECREQUEST PT ON ANCEF 01/19/2020 1424   CULT  01/19/2020 1424    RARE ENTEROBACTER CLOACAE HOLDING FOR POSSIBLE ANAEROBE Performed at [facility], [place] [address], [place], [place] [address]    REPTSTATUS PENDING 01/19/2020 1424    CBG: Recent Labs  Lab 01/21/20 0810 01/21/20 1219 01/21/20 1702 01/21/20 2124 01/22/20 0729  GLUCAP 102* 128* 110* 257* 145*   Medications: . sodium chloride Stopped (01/09/20 0759)  . sodium chloride 10 mL/hr at 01/10/20 1510  . sodium chloride    .  sodium chloride    . ceFEPime (MAXIPIME) IV Stopped (01/21/20 1958)   . (feeding supplement) PROSource Plus  30 mL Oral BID BM  . sodium chloride   Intravenous Once  . amLODipine  10 mg Oral Daily  . atorvastatin  40 mg Oral q1800  . carvedilol  25 mg Oral BID WC  . Chlorhexidine Gluconate Cloth  6 each Topical Daily  . [START ON 01/27/2020] darbepoetin (ARANESP) injection - DIALYSIS  100 mcg Intravenous Q Fri-HD  . dexlansoprazole  60 mg Oral Daily  . docusate sodium  100 mg Oral BID  . DULoxetine  60 mg Oral Daily  . enoxaparin (LOVENOX) injection  30 mg Subcutaneous Q24H  . hydrALAZINE  100 mg Oral TID  . insulin aspart  0-5 Units Subcutaneous QHS  . insulin aspart  0-9 Units Subcutaneous TID WC  . insulin aspart  8 Units Subcutaneous TID WC  . insulin glargine  38 Units Subcutaneous QHS  . multivitamin  1 tablet Oral QHS  . polyethylene glycol  17 g Oral Daily  . senna  1 tablet Oral BID  . sevelamer carbonate  800 mg Oral TID WC    Home meds: - norvasc 5/ coreg 6.25/ asa / lipitor / hydralazine 50 tid - insulin lantus 50u/ aspart 10u tid - oxy IR prn/ cymbalta qd - dexilant/ flexeril prn/ protonix 40   CXR - no acute disease Last creat's outpatient >> Aug 2021 - creat 4.9 Dec 23, 2019 - creat 5.2  Assessment/Plan: 1. AoCKD 5 now  ESRD- b/l creat 4.9- 5.2, eGFR 9- 11 from 8/21- 10/21. Creat 6 on admission, then mid 7's, IVF's given w/o improvement so started HD. S/p TDC and AVF on11/8, 1st HD 11/9, HD#2 11/10, #3 11/12. Continue MWF schedule but will dialyze Sunday, Tuesday, Friday this week due to holiday. No heparin. Planned for HD today.  2. Anemia ckd - Hb up to 9.1 today.Was 6.3 on admit. S/p 4 U prbcs.Tsat low 10%, got 543m Feraheme on 11/6. Increase Aranesp 60 to 100 q week on 11/17. Per pharmacist, pt received aranesp Wednesday and Friday last week. Will change aranesp dosing to q Friday and follow hemoglobin closely.  3. 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 L hip fracture - admission diagnosis. sp ORIF on 10/28 per ortho.S/p I&D surgical wound 11/18. Cultures pending. IV antibiotics started. 5. HTN/ vol - BP's are stable on her 3 home meds. Some volume on exam, UF for volume removal  6. 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 PTH 44.  [facility] [place]Reports she will be going to [facility] here first.  9. Nutrition: albumin very low, on protein supplement.   SAnice Paganini PA-C 01/22/2020, 9:09 AM  [facility] Pager: [phone]

## 2020-01-22 NOTE — Consult Note (Signed)
[facility]       Reason for Consult: hip infection    Referring Physician: Dr. 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 (feeding supplement) PROSource Plus  30 mL Oral BID BM  . sodium chloride   Intravenous Once  . amLODipine  10 mg Oral Daily  . atorvastatin  40 mg Oral q1800  . carvedilol  25 mg Oral BID WC  . Chlorhexidine Gluconate Cloth  6 each Topical Daily  . [START ON 01/27/2020] darbepoetin (ARANESP) injection - DIALYSIS  100 mcg Intravenous Q Fri-HD  . dexlansoprazole  60 mg Oral Daily  . docusate sodium  100 mg Oral BID  . DULoxetine  60 mg Oral Daily  . enoxaparin (LOVENOX) injection  30 mg Subcutaneous Q24H  . hydrALAZINE  100 mg Oral TID  . insulin aspart  0-5 Units Subcutaneous QHS  . insulin aspart  0-9 Units Subcutaneous TID WC  . insulin aspart  8 Units Subcutaneous TID WC  . insulin glargine  38 Units Subcutaneous QHS  . multivitamin  1 tablet Oral QHS  . polyethylene glycol  17 g Oral Daily  . senna  1 tablet Oral BID  . sevelamer carbonate  800 mg Oral TID WC    Recommendations: cefepime for 4 weeks with dialysis  The ID team will follow up again tomorrow  Assessment: She has a post op wound infection with culture growing Enterobacter cloacae.  This tends to have AmpC resistance and is noted to be resistant to ceftriaxone and pip/tazo.  From the OP report it appears to be in the subcutaneous tissue and not deep, no tunneling, though there was some muscle involvement.  She has been changed to  cefepime and will need a prolonged course with dialysis.    Antibiotics: Vancomycin and pip/tazo 3 days Cefepime day 2  HPI: [name] is a 54 y.o. female with a left intertrochanteric femur fracture from a fall in October s/p intramedullary fixation by Dr. 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  She underwent I and D of the site on 11/18 with excisional debridement of the skin, subcutaneous tissue and some muscle.     Review of Systems:  Constitutional: negative for fevers and chills Gastrointestinal: negative for diarrhea All other systems reviewed and are negative    Past Medical History:  Diagnosis Date  . Anxiety 2002  . Chest tightness   . Depression 2001  . Diabetes type 1, uncontrolled ([place])    at age 58  . Diabetic neuropathy ([place])   . Essential hypertension 2015  . GERD (gastroesophageal reflux disease)    about age of 65  . Nausea and vomiting in adult   . Stroke ([place])   . Urinary frequency   . Yeast vaginitis     Social History   Tobacco Use  . Smoking status: Light Tobacco Smoker  Packs/day: 0.25    Years: 22.00    Pack years: 5.50    Types: Cigarettes  . Smokeless tobacco: Never Used  Vaping Use  . Vaping Use: Never used  Substance Use Topics  . Alcohol use: Yes    Alcohol/week: 0.0 standard drinks    Comment: sociial  . Drug use: No    Family History  Problem Relation Age of Onset  . Diabetes Mother   . Heart attack Mother   . Heart disease Mother        before age 7  . Hypertension Mother   . Hyperlipidemia Mother   . Diabetes Father   . Heart attack Father   . Heart disease Father        before age 51  . Diabetes Sister   . Hypertension Sister     Allergies  Allergen Reactions  . Lidocaine Itching  . Trazodone Swelling  . Latex Rash    Physical Exam: Constitutional: in no apparent distress  Vitals:    01/22/20 0418 01/22/20 0921  BP: (!) 129/51 (!) 132/51  Pulse: 61 61  Resp: 16 16  Temp: 98.1 F (36.7 C) 98.2 F (36.8 C)  SpO2: 99% 98%   EYES: anicteric Cardiovascular: Cor RRR Respiratory: clear; GI: Bowel sounds are normal, liver is not enlarged, spleen is not enlarged Musculoskeletal: s/p left BKA Skin: negatives: no rash Neuro: non-focal  Lab Results  Component Value Date   WBC 10.6 (H) 01/22/2020   HGB 9.1 (L) 01/22/2020   HCT 29.0 (L) 01/22/2020   MCV 91.2 01/22/2020   PLT 255 01/22/2020    Lab Results  Component Value Date   CREATININE 4.92 (H) 01/20/2020   BUN 34 (H) 01/20/2020   NA 131 (L) 01/20/2020   K 4.2 01/20/2020   CL 96 (L) 01/20/2020   CO2 24 01/20/2020    Lab Results  Component Value Date   ALT 6 01/07/2020   AST 21 01/07/2020   ALKPHOS 223 (H) 01/07/2020     Microbiology: Recent Results (from the past 240 hour(s))  Aerobic/Anaerobic Culture (surgical/deep wound)     Status: None (Preliminary result)   Collection Time: 01/19/20  2:24 PM   Specimen: PATH Other; Tissue  Result Value Ref Range Status   Specimen Description ABSCESS LEFT HIP  Final   Special Requests PT ON ANCEF  Final   Gram Stain   Final    ABUNDANT WBC PRESENT, PREDOMINANTLY PMN MODERATE GRAM NEGATIVE RODS RARE GRAM POSITIVE COCCI RARE GRAM POSITIVE RODS    Culture   Final    RARE ENTEROBACTER CLOACAE HOLDING FOR POSSIBLE ANAEROBE    Report Status PENDING  Incomplete   Organism ID, Bacteria ENTEROBACTER CLOACAE  Final      Susceptibility   Enterobacter cloacae - MIC*    CEFAZOLIN >=64 RESISTANT Resistant     CEFEPIME 0.25 SENSITIVE Sensitive     CEFTAZIDIME 8 SENSITIVE Sensitive     CIPROFLOXACIN <=0.25 SENSITIVE Sensitive     GENTAMICIN <=1 SENSITIVE Sensitive     IMIPENEM <=0.25 SENSITIVE Sensitive     TRIMETH/SULFA <=20 SENSITIVE Sensitive     PIP/TAZO Value in next row Intermediate      32 INTERMEDIATEPerformed at [facility], [address]  [place], [place] [address]    * RARE ENTEROBACTER CLOACAE    Hector Venne W Amoni Scallan, MD [email or web address] 01/22/2020, 11:48 AM

## 2020-01-22 NOTE — Progress Notes (Signed)
Patient ID: [name], female   DOB: August 27, 1965, 54 y.o.   MRN: [id number] Subjective: 3 Days Post-Op Procedure(s) (LRB): IRRIGATION AND DEBRIDEMENT Left Hip (Left)    Patient reports pain as mild.  Objective:   VITALS:   Vitals:   01/21/20 2123 01/22/20 0418  BP: (!) 109/47 (!) 129/51  Pulse: 63 61  Resp: 17 16  Temp: 98.6 F (37 C) 98.1 F (36.7 C)  SpO2: 97% 99%    Incision: dressing C/D/I and continued but decreased output in JP drain  LABS Recent Labs    01/20/20 0034 01/21/20 0607 01/22/20 0317  HGB 7.3* 9.1* 9.1*  HCT 24.8* 28.9* 29.0*  WBC 10.7* 11.9* 10.6*  PLT 282 265 255    Recent Labs    01/19/20 0746 01/19/20 1858 01/20/20 0952  NA  --   --  131*  K  --   --  4.2  BUN  --   --  34*  CREATININE 3.41* 4.00* 4.92*  GLUCOSE  --   --  234*    No results for input(s): LABPT, INR in the last 72 hours.   Assessment/Plan: 3 Days Post-Op Procedure(s) (LRB): IRRIGATION AND DEBRIDEMENT Left Hip (Left)   Continue ABX therapy due to infection left hip wound Maintain JP drain until directed or removed by [facility] with HD DVT prophylaxis per [facility] PT orders per [facility]

## 2020-01-23 ENCOUNTER — Other Ambulatory Visit: Payer: Self-pay

## 2020-01-23 ENCOUNTER — Encounter (HOSPITAL_COMMUNITY): Payer: Self-pay | Admitting: Physical Medicine and Rehabilitation

## 2020-01-23 ENCOUNTER — Inpatient Hospital Stay (HOSPITAL_COMMUNITY)
Admission: RE | Admit: 2020-01-23 | Discharge: 2020-02-21 | DRG: 500 | Disposition: A | Discharge status: Home / Self Care | Payer: Medicaid Other | Source: Intra-hospital | Attending: Physical Medicine and Rehabilitation | Admitting: Physical Medicine and Rehabilitation

## 2020-01-23 DIAGNOSIS — F5104 Psychophysiologic insomnia: Secondary | ICD-10-CM | POA: Diagnosis present

## 2020-01-23 DIAGNOSIS — G8918 Other acute postprocedural pain: Secondary | ICD-10-CM | POA: Diagnosis not present

## 2020-01-23 DIAGNOSIS — E440 Moderate protein-calorie malnutrition: Secondary | ICD-10-CM | POA: Diagnosis present

## 2020-01-23 DIAGNOSIS — G8929 Other chronic pain: Secondary | ICD-10-CM | POA: Diagnosis present

## 2020-01-23 DIAGNOSIS — D631 Anemia in chronic kidney disease: Secondary | ICD-10-CM | POA: Diagnosis present

## 2020-01-23 DIAGNOSIS — I10 Essential (primary) hypertension: Secondary | ICD-10-CM

## 2020-01-23 DIAGNOSIS — R140 Abdominal distension (gaseous): Secondary | ICD-10-CM | POA: Diagnosis not present

## 2020-01-23 DIAGNOSIS — R7881 Bacteremia: Secondary | ICD-10-CM

## 2020-01-23 DIAGNOSIS — M25552 Pain in left hip: Secondary | ICD-10-CM | POA: Diagnosis not present

## 2020-01-23 DIAGNOSIS — D72829 Elevated white blood cell count, unspecified: Secondary | ICD-10-CM | POA: Diagnosis not present

## 2020-01-23 DIAGNOSIS — F419 Anxiety disorder, unspecified: Secondary | ICD-10-CM | POA: Diagnosis present

## 2020-01-23 DIAGNOSIS — Z8673 Personal history of transient ischemic attack (TIA), and cerebral infarction without residual deficits: Secondary | ICD-10-CM

## 2020-01-23 DIAGNOSIS — Z884 Allergy status to anesthetic agent status: Secondary | ICD-10-CM

## 2020-01-23 DIAGNOSIS — E1165 Type 2 diabetes mellitus with hyperglycemia: Secondary | ICD-10-CM | POA: Diagnosis not present

## 2020-01-23 DIAGNOSIS — Z8249 Family history of ischemic heart disease and other diseases of the circulatory system: Secondary | ICD-10-CM

## 2020-01-23 DIAGNOSIS — M60862 Other myositis, left lower leg: Secondary | ICD-10-CM | POA: Diagnosis present

## 2020-01-23 DIAGNOSIS — E1121 Type 2 diabetes mellitus with diabetic nephropathy: Secondary | ICD-10-CM | POA: Diagnosis not present

## 2020-01-23 DIAGNOSIS — E10649 Type 1 diabetes mellitus with hypoglycemia without coma: Secondary | ICD-10-CM | POA: Diagnosis not present

## 2020-01-23 DIAGNOSIS — N186 End stage renal disease: Secondary | ICD-10-CM

## 2020-01-23 DIAGNOSIS — L89326 Pressure-induced deep tissue damage of left buttock: Secondary | ICD-10-CM | POA: Diagnosis not present

## 2020-01-23 DIAGNOSIS — E1129 Type 2 diabetes mellitus with other diabetic kidney complication: Secondary | ICD-10-CM | POA: Diagnosis not present

## 2020-01-23 DIAGNOSIS — G479 Sleep disorder, unspecified: Secondary | ICD-10-CM | POA: Diagnosis not present

## 2020-01-23 DIAGNOSIS — Z6828 Body mass index (BMI) 28.0-28.9, adult: Secondary | ICD-10-CM

## 2020-01-23 DIAGNOSIS — E162 Hypoglycemia, unspecified: Secondary | ICD-10-CM | POA: Diagnosis not present

## 2020-01-23 DIAGNOSIS — K580 Irritable bowel syndrome with diarrhea: Secondary | ICD-10-CM | POA: Diagnosis present

## 2020-01-23 DIAGNOSIS — F17210 Nicotine dependence, cigarettes, uncomplicated: Secondary | ICD-10-CM | POA: Diagnosis present

## 2020-01-23 DIAGNOSIS — E1022 Type 1 diabetes mellitus with diabetic chronic kidney disease: Secondary | ICD-10-CM | POA: Diagnosis present

## 2020-01-23 DIAGNOSIS — M8618 Other acute osteomyelitis, other site: Secondary | ICD-10-CM | POA: Diagnosis not present

## 2020-01-23 DIAGNOSIS — X58XXXD Exposure to other specified factors, subsequent encounter: Secondary | ICD-10-CM | POA: Diagnosis present

## 2020-01-23 DIAGNOSIS — Z885 Allergy status to narcotic agent status: Secondary | ICD-10-CM

## 2020-01-23 DIAGNOSIS — Z992 Dependence on renal dialysis: Secondary | ICD-10-CM

## 2020-01-23 DIAGNOSIS — E871 Hypo-osmolality and hyponatremia: Secondary | ICD-10-CM | POA: Diagnosis not present

## 2020-01-23 DIAGNOSIS — E1065 Type 1 diabetes mellitus with hyperglycemia: Secondary | ICD-10-CM

## 2020-01-23 DIAGNOSIS — N179 Acute kidney failure, unspecified: Secondary | ICD-10-CM | POA: Diagnosis not present

## 2020-01-23 DIAGNOSIS — I120 Hypertensive chronic kidney disease with stage 5 chronic kidney disease or end stage renal disease: Secondary | ICD-10-CM | POA: Diagnosis not present

## 2020-01-23 DIAGNOSIS — M86172 Other acute osteomyelitis, left ankle and foot: Secondary | ICD-10-CM | POA: Diagnosis not present

## 2020-01-23 DIAGNOSIS — L0291 Cutaneous abscess, unspecified: Secondary | ICD-10-CM | POA: Diagnosis not present

## 2020-01-23 DIAGNOSIS — E8770 Fluid overload, unspecified: Secondary | ICD-10-CM | POA: Diagnosis not present

## 2020-01-23 DIAGNOSIS — Z4781 Encounter for orthopedic aftercare following surgical amputation: Secondary | ICD-10-CM | POA: Diagnosis present

## 2020-01-23 DIAGNOSIS — S72142A Displaced intertrochanteric fracture of left femur, initial encounter for closed fracture: Principal | ICD-10-CM

## 2020-01-23 DIAGNOSIS — Z833 Family history of diabetes mellitus: Secondary | ICD-10-CM

## 2020-01-23 DIAGNOSIS — K582 Mixed irritable bowel syndrome: Secondary | ICD-10-CM | POA: Diagnosis not present

## 2020-01-23 DIAGNOSIS — K219 Gastro-esophageal reflux disease without esophagitis: Secondary | ICD-10-CM | POA: Diagnosis present

## 2020-01-23 DIAGNOSIS — R001 Bradycardia, unspecified: Secondary | ICD-10-CM | POA: Diagnosis present

## 2020-01-23 DIAGNOSIS — Z89512 Acquired absence of left leg below knee: Secondary | ICD-10-CM

## 2020-01-23 DIAGNOSIS — F32A Depression, unspecified: Secondary | ICD-10-CM | POA: Diagnosis present

## 2020-01-23 DIAGNOSIS — K5909 Other constipation: Secondary | ICD-10-CM

## 2020-01-23 DIAGNOSIS — Z7982 Long term (current) use of aspirin: Secondary | ICD-10-CM

## 2020-01-23 DIAGNOSIS — E1069 Type 1 diabetes mellitus with other specified complication: Secondary | ICD-10-CM | POA: Diagnosis not present

## 2020-01-23 DIAGNOSIS — N185 Chronic kidney disease, stage 5: Secondary | ICD-10-CM | POA: Diagnosis not present

## 2020-01-23 DIAGNOSIS — Z91040 Latex allergy status: Secondary | ICD-10-CM

## 2020-01-23 DIAGNOSIS — Z9114 Patient's other noncompliance with medication regimen: Secondary | ICD-10-CM

## 2020-01-23 DIAGNOSIS — L02416 Cutaneous abscess of left lower limb: Secondary | ICD-10-CM | POA: Diagnosis not present

## 2020-01-23 DIAGNOSIS — I6330 Cerebral infarction due to thrombosis of unspecified cerebral artery: Secondary | ICD-10-CM | POA: Diagnosis not present

## 2020-01-23 DIAGNOSIS — W19XXXA Unspecified fall, initial encounter: Secondary | ICD-10-CM | POA: Diagnosis not present

## 2020-01-23 DIAGNOSIS — T148XXA Other injury of unspecified body region, initial encounter: Secondary | ICD-10-CM | POA: Diagnosis not present

## 2020-01-23 DIAGNOSIS — E118 Type 2 diabetes mellitus with unspecified complications: Secondary | ICD-10-CM | POA: Diagnosis not present

## 2020-01-23 DIAGNOSIS — L97524 Non-pressure chronic ulcer of other part of left foot with necrosis of bone: Secondary | ICD-10-CM | POA: Diagnosis not present

## 2020-01-23 DIAGNOSIS — E1040 Type 1 diabetes mellitus with diabetic neuropathy, unspecified: Secondary | ICD-10-CM | POA: Diagnosis present

## 2020-01-23 DIAGNOSIS — Z79899 Other long term (current) drug therapy: Secondary | ICD-10-CM

## 2020-01-23 DIAGNOSIS — M869 Osteomyelitis, unspecified: Secondary | ICD-10-CM

## 2020-01-23 DIAGNOSIS — S72002D Fracture of unspecified part of neck of left femur, subsequent encounter for closed fracture with routine healing: Secondary | ICD-10-CM

## 2020-01-23 DIAGNOSIS — Z794 Long term (current) use of insulin: Secondary | ICD-10-CM

## 2020-01-23 DIAGNOSIS — S88112A Complete traumatic amputation at level between knee and ankle, left lower leg, initial encounter: Secondary | ICD-10-CM | POA: Diagnosis not present

## 2020-01-23 DIAGNOSIS — B952 Enterococcus as the cause of diseases classified elsewhere: Secondary | ICD-10-CM | POA: Diagnosis not present

## 2020-01-23 DIAGNOSIS — Z9049 Acquired absence of other specified parts of digestive tract: Secondary | ICD-10-CM

## 2020-01-23 DIAGNOSIS — M86672 Other chronic osteomyelitis, left ankle and foot: Secondary | ICD-10-CM | POA: Diagnosis not present

## 2020-01-23 DIAGNOSIS — D649 Anemia, unspecified: Secondary | ICD-10-CM | POA: Diagnosis not present

## 2020-01-23 DIAGNOSIS — I739 Peripheral vascular disease, unspecified: Secondary | ICD-10-CM | POA: Diagnosis not present

## 2020-01-23 LAB — CBC WITH DIFFERENTIAL/PLATELET
Abs Immature Granulocytes: 0.2 10*3/uL — ABNORMAL HIGH (ref 0.00–0.07)
Basophils Absolute: 0.1 10*3/uL (ref 0.0–0.1)
Basophils Relative: 1 %
Eosinophils Absolute: 0.3 10*3/uL (ref 0.0–0.5)
Eosinophils Relative: 3 %
HCT: 30.6 % — ABNORMAL LOW (ref 36.0–46.0)
Hemoglobin: 9.4 g/dL — ABNORMAL LOW (ref 12.0–15.0)
Immature Granulocytes: 2 %
Lymphocytes Relative: 12 %
Lymphs Abs: 1.4 10*3/uL (ref 0.7–4.0)
MCH: 28.3 pg (ref 26.0–34.0)
MCHC: 30.7 g/dL (ref 30.0–36.0)
MCV: 92.2 fL (ref 80.0–100.0)
Monocytes Absolute: 1.7 10*3/uL — ABNORMAL HIGH (ref 0.1–1.0)
Monocytes Relative: 15 %
Neutro Abs: 7.8 10*3/uL — ABNORMAL HIGH (ref 1.7–7.7)
Neutrophils Relative %: 67 %
Platelets: 277 10*3/uL (ref 150–400)
RBC: 3.32 MIL/uL — ABNORMAL LOW (ref 3.87–5.11)
RDW: 15.6 % — ABNORMAL HIGH (ref 11.5–15.5)
WBC: 11.5 10*3/uL — ABNORMAL HIGH (ref 4.0–10.5)
nRBC: 0 % (ref 0.0–0.2)

## 2020-01-23 LAB — GLUCOSE, CAPILLARY
Glucose-Capillary: 78 mg/dL (ref 70–99)
Glucose-Capillary: 134 mg/dL — ABNORMAL HIGH (ref 70–99)
Glucose-Capillary: 146 mg/dL — ABNORMAL HIGH (ref 70–99)
Glucose-Capillary: 214 mg/dL — ABNORMAL HIGH (ref 70–99)

## 2020-01-23 MED ORDER — CYCLOBENZAPRINE HCL 10 MG PO TABS: 10.0000 mg | ORAL_TABLET | Freq: Two times a day (BID) | ORAL | 0 refills | Status: DC | PRN

## 2020-01-23 MED ORDER — CARVEDILOL 12.5 MG PO TABS
12.5000 mg | ORAL_TABLET | Freq: Two times a day (BID) | ORAL | Status: DC
  Administered 2020-01-23 – 2020-02-12 (×34): 12.5 mg via ORAL
  Filled 2020-01-23 (×38): qty 1

## 2020-01-23 MED ORDER — CHLORHEXIDINE GLUCONATE CLOTH 2 % EX PADS: 6.0000 | MEDICATED_PAD | Freq: Every day | CUTANEOUS | Status: DC

## 2020-01-23 MED ORDER — DIPHENHYDRAMINE HCL 12.5 MG/5ML PO ELIX: 12.5000 mg | ORAL_SOLUTION | Freq: Four times a day (QID) | ORAL | Status: DC | PRN

## 2020-01-23 MED ORDER — PROSOURCE PLUS PO LIQD
30.0000 mL | Freq: Two times a day (BID) | ORAL | Status: DC
Start: 2020-01-23 — End: 2020-02-21

## 2020-01-23 MED ORDER — CHLORHEXIDINE GLUCONATE CLOTH 2 % EX PADS
6.0000 | MEDICATED_PAD | Freq: Every day | CUTANEOUS | Status: DC
  Administered 2020-01-24 – 2020-01-26 (×3): 6 via TOPICAL

## 2020-01-23 MED ORDER — POLYETHYLENE GLYCOL 3350 17 G PO PACK
17.0000 g | PACK | Freq: Every day | ORAL | Status: DC | PRN
  Filled 2020-01-23: qty 1

## 2020-01-23 MED ORDER — MILK AND MOLASSES ENEMA
1.0000 | Freq: Every day | RECTAL | Status: DC | PRN
  Filled 2020-01-23: qty 240

## 2020-01-23 MED ORDER — INSULIN GLARGINE 100 UNIT/ML ~~LOC~~ SOLN
38.0000 [IU] | Freq: Every day | SUBCUTANEOUS | 11 refills | Status: DC
Start: 2020-01-23 — End: 2020-02-21

## 2020-01-23 MED ORDER — CARVEDILOL 12.5 MG PO TABS: 25.0000 mg | ORAL_TABLET | Freq: Two times a day (BID) | ORAL | Status: DC

## 2020-01-23 MED ORDER — MENTHOL 3 MG MT LOZG: 1.0000 | LOZENGE | OROMUCOSAL | Status: DC | PRN

## 2020-01-23 MED ORDER — SODIUM CHLORIDE 0.9 % IV SOLN: 1.0000 g | INTRAVENOUS | Status: DC

## 2020-01-23 MED ORDER — ATORVASTATIN CALCIUM 40 MG PO TABS
40.0000 mg | ORAL_TABLET | Freq: Every day | ORAL | Status: DC
  Administered 2020-01-23 – 2020-02-20 (×29): 40 mg via ORAL
  Filled 2020-01-23 (×29): qty 1

## 2020-01-23 MED ORDER — METRONIDAZOLE 500 MG PO TABS
500.0000 mg | ORAL_TABLET | Freq: Three times a day (TID) | ORAL | Status: DC
  Administered 2020-01-23 – 2020-01-24 (×2): 500 mg via ORAL
  Filled 2020-01-23 (×6): qty 1

## 2020-01-23 MED ORDER — AMLODIPINE BESYLATE 10 MG PO TABS
10.0000 mg | ORAL_TABLET | Freq: Every day | ORAL | Status: DC
  Administered 2020-01-24 – 2020-02-08 (×12): 10 mg via ORAL
  Filled 2020-01-23 (×14): qty 1

## 2020-01-23 MED ORDER — HYDRALAZINE HCL 100 MG PO TABS
100.0000 mg | ORAL_TABLET | Freq: Three times a day (TID) | ORAL | Status: DC
Start: 2020-01-23 — End: 2020-02-21

## 2020-01-23 MED ORDER — PROSOURCE PLUS PO LIQD
30.0000 mL | Freq: Two times a day (BID) | ORAL | Status: DC
  Administered 2020-01-24 – 2020-02-20 (×46): 30 mL via ORAL
  Filled 2020-01-23 (×46): qty 30

## 2020-01-23 MED ORDER — NON FORMULARY: 0.5000 mg | Freq: Two times a day (BID) | Status: DC

## 2020-01-23 MED ORDER — ALOSETRON HCL 0.5 MG PO TABS
0.5000 mg | ORAL_TABLET | Freq: Two times a day (BID) | ORAL | Status: DC
  Administered 2020-01-23 – 2020-02-07 (×29): 0.5 mg via ORAL
  Filled 2020-01-23 (×30): qty 1

## 2020-01-23 MED ORDER — CALCIUM CARBONATE ANTACID 500 MG PO CHEW: 1.0000 | CHEWABLE_TABLET | Freq: Three times a day (TID) | ORAL | Status: DC | PRN

## 2020-01-23 MED ORDER — AMITRIPTYLINE HCL 10 MG PO TABS
10.0000 mg | ORAL_TABLET | Freq: Every day | ORAL | Status: DC
  Administered 2020-01-23 – 2020-02-20 (×29): 10 mg via ORAL
  Filled 2020-01-23 (×29): qty 1

## 2020-01-23 MED ORDER — LIP MEDEX EX OINT
1.0000 "application " | TOPICAL_OINTMENT | CUTANEOUS | Status: DC | PRN
  Filled 2020-01-23: qty 7

## 2020-01-23 MED ORDER — METRONIDAZOLE 500 MG PO TABS
500.0000 mg | ORAL_TABLET | Freq: Three times a day (TID) | ORAL | Status: DC
  Administered 2020-01-23: 500 mg via ORAL
  Filled 2020-01-23 (×2): qty 1

## 2020-01-23 MED ORDER — LORATADINE 10 MG PO TABS: 10.0000 mg | ORAL_TABLET | Freq: Every day | ORAL | Status: DC | PRN

## 2020-01-23 MED ORDER — CYCLOBENZAPRINE HCL 5 MG PO TABS
5.0000 mg | ORAL_TABLET | Freq: Two times a day (BID) | ORAL | Status: DC | PRN
  Administered 2020-01-23 – 2020-02-08 (×21): 5 mg via ORAL
  Filled 2020-01-23 (×22): qty 1

## 2020-01-23 MED ORDER — POLYVINYL ALCOHOL 1.4 % OP SOLN
1.0000 [drp] | OPHTHALMIC | Status: DC | PRN
  Administered 2020-01-24: 1 [drp] via OPHTHALMIC
  Filled 2020-01-23: qty 15

## 2020-01-23 MED ORDER — RENA-VITE PO TABS
1.0000 | ORAL_TABLET | Freq: Every day | ORAL | Status: DC
  Administered 2020-01-23 – 2020-02-20 (×29): 1 via ORAL
  Filled 2020-01-23 (×29): qty 1

## 2020-01-23 MED ORDER — INSULIN ASPART 100 UNIT/ML ~~LOC~~ SOLN
0.0000 [IU] | Freq: Three times a day (TID) | SUBCUTANEOUS | Status: DC
  Administered 2020-01-23 – 2020-01-25 (×2): 1 [IU] via SUBCUTANEOUS
  Administered 2020-01-25 – 2020-01-28 (×4): 2 [IU] via SUBCUTANEOUS
  Administered 2020-01-29: 1 [IU] via SUBCUTANEOUS
  Administered 2020-01-29: 3 [IU] via SUBCUTANEOUS
  Administered 2020-01-29 – 2020-01-31 (×5): 2 [IU] via SUBCUTANEOUS
  Administered 2020-01-31: 1 [IU] via SUBCUTANEOUS
  Administered 2020-01-31 – 2020-02-01 (×2): 2 [IU] via SUBCUTANEOUS
  Administered 2020-02-01: 1 [IU] via SUBCUTANEOUS
  Administered 2020-02-02 (×2): 2 [IU] via SUBCUTANEOUS
  Administered 2020-02-03: 1 [IU] via SUBCUTANEOUS
  Administered 2020-02-03: 2 [IU] via SUBCUTANEOUS
  Administered 2020-02-04 – 2020-02-15 (×14): 1 [IU] via SUBCUTANEOUS
  Administered 2020-02-15: 2 [IU] via SUBCUTANEOUS
  Administered 2020-02-16 (×2): 1 [IU] via SUBCUTANEOUS
  Administered 2020-02-18 – 2020-02-19 (×2): 2 [IU] via SUBCUTANEOUS
  Administered 2020-02-19: 1 [IU] via SUBCUTANEOUS

## 2020-01-23 MED ORDER — INSULIN ASPART 100 UNIT/ML ~~LOC~~ SOLN
0.0000 [IU] | Freq: Every day | SUBCUTANEOUS | Status: DC
  Administered 2020-01-23 – 2020-02-16 (×4): 2 [IU] via SUBCUTANEOUS
  Administered 2020-02-17: 3 [IU] via SUBCUTANEOUS
  Administered 2020-02-20: 2 [IU] via SUBCUTANEOUS

## 2020-01-23 MED ORDER — INSULIN ASPART 100 UNIT/ML ~~LOC~~ SOLN
8.0000 [IU] | Freq: Three times a day (TID) | SUBCUTANEOUS | Status: DC
  Administered 2020-01-23 – 2020-01-27 (×9): 8 [IU] via SUBCUTANEOUS

## 2020-01-23 MED ORDER — CARVEDILOL 25 MG PO TABS
25.0000 mg | ORAL_TABLET | Freq: Two times a day (BID) | ORAL | Status: DC
Start: 2020-01-23 — End: 2020-02-21

## 2020-01-23 MED ORDER — AMLODIPINE BESYLATE 10 MG PO TABS
10.0000 mg | ORAL_TABLET | Freq: Every day | ORAL | Status: DC
Start: 2020-01-23 — End: 2020-02-21

## 2020-01-23 MED ORDER — SALINE SPRAY 0.65 % NA SOLN: 1.0000 | NASAL | Status: DC | PRN

## 2020-01-23 MED ORDER — BISACODYL 10 MG RE SUPP: 10.0000 mg | Freq: Every day | RECTAL | Status: DC | PRN

## 2020-01-23 MED ORDER — ALUMINUM HYDROXIDE GEL 320 MG/5ML PO SUSP
10.0000 mL | Freq: Four times a day (QID) | ORAL | Status: DC | PRN
  Filled 2020-01-23: qty 30

## 2020-01-23 MED ORDER — PROCHLORPERAZINE EDISYLATE 10 MG/2ML IJ SOLN: 5.0000 mg | Freq: Four times a day (QID) | INTRAMUSCULAR | Status: DC | PRN

## 2020-01-23 MED ORDER — TRAMADOL HCL 50 MG PO TABS
50.0000 mg | ORAL_TABLET | Freq: Four times a day (QID) | ORAL | Status: DC | PRN
  Administered 2020-01-24 – 2020-02-21 (×21): 50 mg via ORAL
  Filled 2020-01-23 (×22): qty 1

## 2020-01-23 MED ORDER — MUSCLE RUB 10-15 % EX CREA
1.0000 "application " | TOPICAL_CREAM | CUTANEOUS | Status: DC | PRN
  Administered 2020-01-24: 1 via TOPICAL
  Filled 2020-01-23: qty 85

## 2020-01-23 MED ORDER — ENOXAPARIN SODIUM 30 MG/0.3ML ~~LOC~~ SOLN: 30.0000 mg | SUBCUTANEOUS | Status: DC

## 2020-01-23 MED ORDER — SEVELAMER CARBONATE 800 MG PO TABS
800.0000 mg | ORAL_TABLET | Freq: Three times a day (TID) | ORAL | Status: DC
  Administered 2020-01-23 – 2020-02-08 (×44): 800 mg via ORAL
  Filled 2020-01-23 (×47): qty 1

## 2020-01-23 MED ORDER — OXYCODONE HCL 5 MG PO TABS
10.0000 mg | ORAL_TABLET | ORAL | Status: DC | PRN
  Administered 2020-01-23 – 2020-02-02 (×32): 15 mg via ORAL
  Administered 2020-02-02: 10 mg via ORAL
  Administered 2020-02-02 – 2020-02-03 (×2): 15 mg via ORAL
  Administered 2020-02-04: 10 mg via ORAL
  Administered 2020-02-04 – 2020-02-06 (×7): 15 mg via ORAL
  Administered 2020-02-06: 10 mg via ORAL
  Administered 2020-02-07 – 2020-02-13 (×12): 15 mg via ORAL
  Administered 2020-02-14: 10 mg via ORAL
  Administered 2020-02-16 – 2020-02-17 (×3): 15 mg via ORAL
  Filled 2020-01-23: qty 3
  Filled 2020-01-23: qty 2
  Filled 2020-01-23 (×15): qty 3
  Filled 2020-01-23: qty 2
  Filled 2020-01-23 (×35): qty 3
  Filled 2020-01-23: qty 2
  Filled 2020-01-23 (×3): qty 3
  Filled 2020-01-23: qty 2
  Filled 2020-01-23 (×5): qty 3

## 2020-01-23 MED ORDER — SEVELAMER CARBONATE 800 MG PO TABS
800.0000 mg | ORAL_TABLET | Freq: Three times a day (TID) | ORAL | Status: DC
Start: 2020-01-23 — End: 2020-02-21

## 2020-01-23 MED ORDER — PANTOPRAZOLE SODIUM 40 MG PO TBEC
40.0000 mg | DELAYED_RELEASE_TABLET | Freq: Every day | ORAL | Status: DC
  Administered 2020-01-24 – 2020-02-21 (×29): 40 mg via ORAL
  Filled 2020-01-23 (×29): qty 1

## 2020-01-23 MED ORDER — DULOXETINE HCL 30 MG PO CPEP
60.0000 mg | ORAL_CAPSULE | Freq: Every day | ORAL | Status: DC
  Administered 2020-01-24 – 2020-02-07 (×15): 60 mg via ORAL
  Filled 2020-01-23 (×16): qty 2

## 2020-01-23 MED ORDER — HYDRALAZINE HCL 50 MG PO TABS
100.0000 mg | ORAL_TABLET | Freq: Three times a day (TID) | ORAL | Status: DC
  Administered 2020-01-23 – 2020-02-21 (×68): 100 mg via ORAL
  Filled 2020-01-23 (×73): qty 2

## 2020-01-23 MED ORDER — INSULIN GLARGINE 100 UNIT/ML ~~LOC~~ SOLN
38.0000 [IU] | Freq: Every day | SUBCUTANEOUS | Status: DC
  Administered 2020-01-23 – 2020-01-27 (×5): 38 [IU] via SUBCUTANEOUS
  Filled 2020-01-23 (×7): qty 0.38

## 2020-01-23 MED ORDER — SODIUM CHLORIDE 0.9 % IV SOLN
1.0000 g | INTRAVENOUS | Status: DC
  Administered 2020-01-23 – 2020-01-24 (×2): 1 g via INTRAVENOUS
  Filled 2020-01-23 (×3): qty 1

## 2020-01-23 MED ORDER — OXYCODONE HCL 5 MG PO TABS
5.0000 mg | ORAL_TABLET | ORAL | 0 refills | Status: DC | PRN
Start: 2020-01-23 — End: 2020-02-21

## 2020-01-23 MED ORDER — ACETAMINOPHEN 325 MG PO TABS
325.0000 mg | ORAL_TABLET | ORAL | Status: DC | PRN
  Administered 2020-01-28 – 2020-02-20 (×4): 650 mg via ORAL
  Filled 2020-01-23 (×4): qty 2

## 2020-01-23 MED ORDER — PROCHLORPERAZINE MALEATE 5 MG PO TABS
5.0000 mg | ORAL_TABLET | Freq: Four times a day (QID) | ORAL | Status: DC | PRN
  Administered 2020-02-06: 10 mg via ORAL
  Filled 2020-01-23: qty 2

## 2020-01-23 MED ORDER — PROCHLORPERAZINE 25 MG RE SUPP: 12.5000 mg | Freq: Four times a day (QID) | RECTAL | Status: DC | PRN

## 2020-01-23 MED ORDER — SODIUM CHLORIDE 0.9 % IV SOLN
1.0000 g | INTRAVENOUS | Status: DC
  Filled 2020-01-23: qty 1

## 2020-01-23 MED ORDER — GUAIFENESIN-DM 100-10 MG/5ML PO SYRP: 5.0000 mL | ORAL_SOLUTION | Freq: Four times a day (QID) | ORAL | Status: DC | PRN

## 2020-01-23 MED ORDER — PHENOL 1.4 % MT LIQD: 1.0000 | OROMUCOSAL | Status: DC | PRN

## 2020-01-23 MED ORDER — ENOXAPARIN SODIUM 30 MG/0.3ML ~~LOC~~ SOLN
30.0000 mg | SUBCUTANEOUS | Status: DC
  Administered 2020-01-24 – 2020-02-18 (×26): 30 mg via SUBCUTANEOUS
  Filled 2020-01-23 (×26): qty 0.3

## 2020-01-23 MED ORDER — CHLORHEXIDINE GLUCONATE CLOTH 2 % EX PADS
6.0000 | MEDICATED_PAD | Freq: Every day | CUTANEOUS | Status: DC
  Administered 2020-01-23: 6 via TOPICAL

## 2020-01-23 MED ORDER — DARBEPOETIN ALFA 100 MCG/0.5ML IJ SOSY
100.0000 ug | PREFILLED_SYRINGE | INTRAMUSCULAR | Status: DC
  Filled 2020-01-23: qty 0.5

## 2020-01-23 NOTE — H&P (Signed)
Physical Medicine and Rehabilitation Admission H&P    Chief Complaint  Patient presents with  . Functional deficits due to L-BKA    L hip infection.   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 She underwent IM nailing of Left hip by Dr. Doran Durand with Sq lovenox for DVT prophylaxis. Post op had worsening of renal status with fluid overload, metabolic acidosis, lethargy concerning for uremia as well as progressive leucocytosis but declined MRI of left foot initially. MRI was done 11/7 and showed 3.3 cm X 2.9 cm abscess with extensive osteomyelitis and numerous small bone abscesses. She was started on IV ancef.  Due to acute on chronic renal failure tunneled catheter was placed by Dr. Stanford Breed on 11/08 and HD initiated.   Dr. Doran Durand consulted for input and recommended L-BKA for definitive treatment. Patient was agreeable to undergo L-BKA on 11/11 and is NWB LLE with stump sock/stump protector. She was found to develop purulent drainage from left hip incision on 11/18 and underwent I& D of hip abscess with debridement of skin, SQ tissue and muscle with drain placement by Dr. Lyla Glassing. Dr. Linus Salmons consulted for input on antibiotic coverage as wound culture positive for enterobacter cloacae--->now on Cefepime and flagyl for 4 weeks. pain control improving--not taking IV dilaudid since yesterday. Penrose drain removed from left hip today. Therapy ongoing and patient limited by weakness, anxiety and pain affecting ADLs and mobility.    CIR recommended due to functional decline.     Review of Systems  Constitutional: Negative for chills and fever.  HENT: Negative for hearing loss and tinnitus.   Eyes: Negative for blurred vision.  Respiratory: Negative for cough and shortness of breath.   Cardiovascular: Negative for chest  pain and palpitations.  Gastrointestinal: Positive for abdominal pain, diarrhea and heartburn.  Musculoskeletal: Positive for joint pain and myalgias.  Skin: Negative for rash.  Neurological: Positive for sensory change (numbness/tingling bilateral hands/feet) and weakness. Negative for dizziness and headaches.  Psychiatric/Behavioral: The patient has insomnia (chronic).      Past Medical History:  Diagnosis Date  . Anxiety 2002  . Chest tightness   . Depression 2001  . Diabetes type 1, uncontrolled ([place])    at age 39  . Diabetic neuropathy ([place])   . Essential hypertension 2015  . GERD (gastroesophageal reflux disease)    about age of 74  . Nausea and vomiting in adult   . Stroke ([place])   . Urinary frequency   . Yeast vaginitis     Past Surgical History:  Procedure Laterality Date  . ACHILLES TENDON SURGERY Left 03/10/2013   Procedure: LEFT CHOPART AMPUTATION/ LEFT TENDON ACHILLES [place];  Surgeon: Wylene Simmer, MD;  Location: [place];  Service: Orthopedics;  Laterality: Left;  . AMPUTATION Left 06/15/2012   Procedure: AMPUTATION DIGIT;  Surgeon: Meredith Pel, MD;  Location: [place];  Service: Orthopedics;  Laterality: Left;  Left great toe revision amputation  . AMPUTATION Left 01/12/2020   Procedure: AMPUTATION BELOW KNEE;  Surgeon: Wylene Simmer, MD;  Location: [place];  Service: Orthopedics;  Laterality: Left;  . AV FISTULA PLACEMENT Left 01/09/2020   Procedure: LEFT UPPER ARM ARTERIOVENOUS (AV) FISTULA CREATION;  Surgeon: Cherre Robins, MD;  Location: [place];  Service: Vascular;  Laterality: Left;  .  ENDOMETRIAL ABLATION    . I & D EXTREMITY Left 01/19/2020   Procedure: IRRIGATION AND DEBRIDEMENT Left Hip;  Surgeon: Rod Can, MD;  Location: [place];  Service: Orthopedics;  Laterality: Left;  . INSERTION OF DIALYSIS CATHETER Right 01/09/2020   Procedure: INSERTION OF DIALYSIS CATHETER;  Surgeon: Cherre Robins, MD;  Location: [place];  Service: Vascular;  Laterality:  Right;  . INTRAMEDULLARY (IM) NAIL INTERTROCHANTERIC Left 12/29/2019   Procedure: INTRAMEDULLARY (IM) NAIL INTERTROCHANTRIC;  Surgeon: Rod Can, MD;  Location: [facility];  Service: Orthopedics;  Laterality: Left;  . IR ANGIOGRAM EXTREMITY LEFT  12/17/2018  . IR FEM POP ART ATHERECT INC PTA MOD SED  12/17/2018  . IR RADIOLOGIST EVAL & MGMT  11/30/2018  . IR RADIOLOGIST EVAL & MGMT  12/09/2018  . IR RADIOLOGIST EVAL & MGMT  02/08/2019  . IR US GUIDE VASC ACCESS RIGHT  12/17/2018  . LAPAROSCOPIC CHOLECYSTECTOMY  01/30/2015   [facility]     Family History  Problem Relation Age of Onset  . Diabetes Mother   . Heart attack Mother   . Heart disease Mother        before age 20  . Hypertension Mother   . Hyperlipidemia Mother   . Diabetes Father   . Heart attack Father   . Heart disease Father        before age 80  . Diabetes Sister   . Hypertension Sister     Social History:  Lives with daughter. She wasas using knee walker PTA--had to be NWB X 1 months due to foot infection. She was working prior to Orthoptist for [employer]. She reports that she has been smoking cigarettes--1/2 PPD. She has a 5.50 pack-year smoking history. She has never used smokeless tobacco. She reports current alcohol use. She reports that she does not use drugs.   Allergies  Allergen Reactions  . Lidocaine Itching  . Trazodone Swelling  . Latex Rash    Medications Prior to Admission  Medication Sig Dispense Refill  . acetaminophen (TYLENOL) 500 MG tablet Take 1,000 mg by mouth every 6 (six) hours as needed for mild pain, fever or headache.    . alosetron (LOTRONEX) 1 MG tablet Take 1 mg by mouth 2 (two) times daily.    Marland Kitchen amLODipine (NORVASC) 5 MG tablet Take 1 tablet (5 mg total) by mouth daily. 30 tablet 5  . aspirin EC 81 MG EC tablet Take 1 tablet (81 mg total) by mouth daily. 30 tablet 0  . atorvastatin (LIPITOR) 40 MG tablet TAKE 1 TABLET (40 MG TOTAL) BY MOUTH DAILY AT 6 PM. 30  tablet 1  . carvedilol (COREG) 6.25 MG tablet Take 6.25 mg by mouth 2 (two) times daily.    Marland Kitchen CINNAMON PO Take 1 tablet by mouth daily.    . cyclobenzaprine (FLEXERIL) 10 MG tablet TAKE 1 TABLET AT BEDTIME AS NEEDED FOR MUSCLE SPASM (Patient taking differently: Take 10 mg by mouth at bedtime as needed for muscle spasms. ) 30 tablet 5  . DEXILANT 60 MG capsule Take 60 mg by mouth daily.  6  . DULoxetine (CYMBALTA) 60 MG capsule TAKE 1 CAPSULE BY MOUTH EVERY DAY. Please make PCP appointment. (Patient taking differently: Take 60 mg by mouth daily. ) 30 capsule 0  . hydrALAZINE (APRESOLINE) 50 MG tablet TAKE 1 TABLET BY MOUTH THREE TIMES A DAY (Patient taking differently: Take 50 mg by mouth 3 (three) times daily. ) 30 tablet 0  .  insulin aspart (NOVOLOG FLEXPEN) 100 UNIT/ML FlexPen Inject 10 Units into the skin 3 (three) times daily with meals. 15 mL 4  . LANTUS SOLOSTAR 100 UNIT/ML Solostar Pen INJECT 50 UNITS SUBCUTANEOUSLY EVERY DAY (Patient taking differently: Inject 20 Units into the skin daily. ) 15 mL 3  . MAGNESIUM PO Take 1 tablet by mouth daily.    . Oxycodone HCl 10 MG TABS Take 10 mg by mouth 3 (three) times daily as needed (pain).     . Blood Glucose Monitoring Suppl (ACCU-CHEK AVIVA PLUS) w/Device KIT CHECK BLOOD SUGARS THREE TIMES A DAY.  DX:250.03 1 kit 0  . calcium citrate-vitamin D 500-400 MG-UNIT chewable tablet Chew 1 tablet by mouth 2 (two) times daily. (Patient not taking: Reported on 12/28/2019) 180 tablet 1  . glucose blood (ACCU-CHEK AVIVA PLUS) test strip USE TO CHECK BLOOD SUGAR BEFORE MEALS AND AT BEDTIME dx code E10.65 125 each 6  . Insulin Pen Needle (TRUEPLUS PEN NEEDLES) 32G X 4 MM MISC Use to inject insulin as directed. 100 each 11    Drug Regimen Review  Drug regimen was reviewed and remains appropriate with no significant issues identified  Home: Home Living Family/patient expects to be discharged to:: Private residence Living Arrangements: Children  (daughter) Available Help at Discharge:  (daughter works day shift) Type of Home: Mobile home Home Access: Stairs to enter Secretary/administrator of Steps: 3 Entrance Stairs-Rails: None Home Layout: One level Bathroom Shower/Tub: Health visitor: Standard Bathroom Accessibility: Yes Home Equipment: Environmental consultant - 2 wheels, Bedside commode, Toilet riser Additional Comments: Pt unsure if bathroom has tub or shower and if doors are w/c accessable.  Lives With: Daughter   Functional History: Prior Function Level of Independence: Independent with assistive device(s) Comments: Using knee walker  Functional Status:  Mobility: Bed Mobility Overal bed mobility: Needs Assistance Bed Mobility: Supine to Sit Rolling: Min assist Supine to sit: Mod assist Sit to supine: Mod assist, +2 for physical assistance General bed mobility comments: Assistance with LLE to edge of bed and for trunk elevation.  Once sitting edge of bed she was able to maintain her balance. Transfers Overall transfer level: Needs assistance Equipment used: Rolling walker (2 wheeled) Transfer via Lift Equipment: Stedy Transfers: Sit to/from Stand Sit to Stand: Mod assist, +2 physical assistance Stand pivot transfers: Mod assist, +2 physical assistance  Lateral/Scoot Transfers: Mod assist, +2 physical assistance, +2 safety/equipment General transfer comment: Pt stood x 1.5 min edge of bed with mod +1 to maintain.  Performed additional trial mod +2 to perform pericare.  Pt able to pivot on R foot to move up to [facility]. Ambulation/Gait Ambulation/Gait assistance:  (NT) General Gait Details: NT    ADL: ADL Overall ADL's : Needs assistance/impaired Eating/Feeding: Independent, Sitting Grooming: Set up, Sitting, Wash/dry face Grooming Details (indicate cue type and reason): Washed face seated at side of bed with back unsupported. Upper Body Bathing: Set up, Sitting Lower Body Bathing: Maximal assistance, Cueing  for safety, Bed level Upper Body Dressing : Set up, Sitting Lower Body Dressing: Maximal assistance, Sitting/lateral leans, Sit to/from stand Lower Body Dressing Details (indicate cue type and reason): d/t pain in L LE with mobility Toilet Transfer: Total assistance, +2 for physical assistance, +2 for safety/equipment Toilet Transfer Details (indicate cue type and reason): Total assist to transfer to BSc with use of Corene Cornea. +3 assistance for safety to manage patient, equipment, and patient's LLE due to pain. Toileting- Clothing Manipulation and Hygiene: Total assistance, Sitting/lateral lean  Toileting - Clothing Manipulation Details (indicate cue type and reason): Total assist for toileting on BSC. Functional mobility during ADLs: Moderate assistance, +2 for physical assistance, +2 for safety/equipment General ADL Comments: Declining to perform ADLs at this time. Self limiting behavior  Cognition: Cognition Overall Cognitive Status: Within Functional Limits for tasks assessed Orientation Level: Oriented X4 Cognition Arousal/Alertness: Awake/alert Behavior During Therapy: Anxious, WFL for tasks assessed/performed Overall Cognitive Status: Within Functional Limits for tasks assessed General Comments: Pt required encouragement to redirect to progression of activity.   Blood pressure (!) 127/52, pulse (!) 59, temperature 98.1 F (36.7 C), resp. rate 20, height $RemoveBe'5\' 10"'jTflskXle$  (1.778 m), weight 88.4 kg, SpO2 95 %. Physical Exam General: Alert and oriented x 3, No apparent distress HEENT: Head is normocephalic, atraumatic, PERRLA, EOMI, sclera anicteric, oral mucosa pink and moist, dentition intact, ext ear canals clear,  Neck: Supple without JVD or lymphadenopathy Heart: Reg rate and rhythm. No murmurs rubs or gallops Chest: CTA bilaterally without wheezes, rales, or rhonchi; no distress Abdomen: Soft, non-tender, non-distended, bowel sounds positive. Extremities: No clubbing, cyanosis, or  edema. Pulses are 2+ Skin: Clean and intact without signs of breakdown Neuro: Pt is cognitively appropriate with normal insight, memory, and awareness. Cranial nerves 2-12 are intact.  MSK: L-BKA with compressive dressing. Left hip with two small sutured incision. Prior puncture due to penrose with moderate  serosanguinous drainage on dressing. 1+ pedal edema on right. LUE with dry dressing--no edema.   Psych: Pt's affect is appropriate. Pt is cooperative  Results for orders placed or performed during the hospital encounter of 12/28/19 (from the past 48 hour(s))  Glucose, capillary     Status: Abnormal   Collection Time: 01/21/20 12:19 PM  Result Value Ref Range   Glucose-Capillary 128 (H) 70 - 99 mg/dL    Comment: Glucose reference range applies only to samples taken after fasting for at least 8 hours.  Glucose, capillary     Status: Abnormal   Collection Time: 01/21/20  5:02 PM  Result Value Ref Range   Glucose-Capillary 110 (H) 70 - 99 mg/dL    Comment: Glucose reference range applies only to samples taken after fasting for at least 8 hours.  Glucose, capillary     Status: Abnormal   Collection Time: 01/21/20  9:24 PM  Result Value Ref Range   Glucose-Capillary 257 (H) 70 - 99 mg/dL    Comment: Glucose reference range applies only to samples taken after fasting for at least 8 hours.  CBC with Differential/Platelet     Status: Abnormal   Collection Time: 01/22/20  3:17 AM  Result Value Ref Range   WBC 10.6 (H) 4.0 - 10.5 K/uL   RBC 3.18 (L) 3.87 - 5.11 MIL/uL   Hemoglobin 9.1 (L) 12.0 - 15.0 g/dL   HCT 29.0 (L) 36 - 46 %   MCV 91.2 80.0 - 100.0 fL   MCH 28.6 26.0 - 34.0 pg   MCHC 31.4 30.0 - 36.0 g/dL   RDW 15.2 11.5 - 15.5 %   Platelets 255 150 - 400 K/uL   nRBC 0.0 0.0 - 0.2 %   Neutrophils Relative % 63 %   Neutro Abs 6.7 1.7 - 7.7 K/uL   Lymphocytes Relative 14 %   Lymphs Abs 1.5 0.7 - 4.0 K/uL   Monocytes Relative 17 %   Monocytes Absolute 1.8 (H) 0.1 - 1.0 K/uL    Eosinophils Relative 3 %   Eosinophils Absolute 0.3 0.0 - 0.5 K/uL  Basophils Relative 1 %   Basophils Absolute 0.1 0.0 - 0.1 K/uL   Immature Granulocytes 2 %   Abs Immature Granulocytes 0.21 (H) 0.00 - 0.07 K/uL    Comment: Performed at [facility], [place] [address], [place], [place] [address]  Glucose, capillary     Status: Abnormal   Collection Time: 01/22/20  7:29 AM  Result Value Ref Range   Glucose-Capillary 145 (H) 70 - 99 mg/dL    Comment: Glucose reference range applies only to samples taken after fasting for at least 8 hours.  Glucose, capillary     Status: None   Collection Time: 01/22/20 12:11 PM  Result Value Ref Range   Glucose-Capillary 92 70 - 99 mg/dL    Comment: Glucose reference range applies only to samples taken after fasting for at least 8 hours.  Renal function panel     Status: Abnormal   Collection Time: 01/22/20  3:03 PM  Result Value Ref Range   Sodium 130 (L) 135 - 145 mmol/L   Potassium 3.8 3.5 - 5.1 mmol/L   Chloride 95 (L) 98 - 111 mmol/L   CO2 24 22 - 32 mmol/L   Glucose, Bld 135 (H) 70 - 99 mg/dL    Comment: Glucose reference range applies only to samples taken after fasting for at least 8 hours.   BUN 34 (H) 6 - 20 mg/dL   Creatinine, Ser 5.41 (H) 0.44 - 1.00 mg/dL   Calcium 8.4 (L) 8.9 - 10.3 mg/dL   Phosphorus 3.4 2.5 - 4.6 mg/dL   Albumin 1.7 (L) 3.5 - 5.0 g/dL   GFR, Estimated 9 (L) >60 mL/min    Comment: (NOTE) Calculated using the CKD-EPI Creatinine Equation (2021)    Anion gap 11 5 - 15    Comment: Performed at [facility] [address], [place], [place] [address]  Glucose, capillary     Status: None   Collection Time: 01/22/20  6:14 PM  Result Value Ref Range   Glucose-Capillary 93 70 - 99 mg/dL    Comment: Glucose reference range applies only to samples taken after fasting for at least 8 hours.  Glucose, capillary     Status: Abnormal   Collection Time: 01/22/20  8:48 PM  Result Value Ref Range   Glucose-Capillary  120 (H) 70 - 99 mg/dL    Comment: Glucose reference range applies only to samples taken after fasting for at least 8 hours.  Glucose, capillary     Status: None   Collection Time: 01/23/20  7:59 AM  Result Value Ref Range   Glucose-Capillary 78 70 - 99 mg/dL    Comment: Glucose reference range applies only to samples taken after fasting for at least 8 hours.   No results found.     Medical Problem List and Plan: 1.  Impaired mobility and ADLs secondary to L BKA  -patient may not shower  -ELOS/Goals: 2-3 weeks MinA 2.  Antithrombotics: -DVT/anticoagulation:  Pharmaceutical: Lovenox  -antiplatelet therapy: N/A 3. Chronic pain/Pain Management: Was on oxycodone 5 mg tid prn as well Cymbalta for neuropathy (at baseline).  4. Mood: Team to provide ego support/encouragement.   -antipsychotic agents: N/A 5. Neuropsych: This patient is capable of making decisions on her own behalf. 6. Skin/Wound Care: Monitor wound daily  7. Fluids/Electrolytes/Nutrition: Strict I/O. 1200 cc FR. Will consult dietician to educate on renal diet.  8. Left hip ORIF/hip abscess s/p I&D 11/18: NWB LLE. Enterobacter treated with Cefepime Day #3 and  metronidazole D#1 9. 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  10 T1DM with neuropathy: Hgb A1c-6.2. Was on Lantus 50 units in am with 10 units novolog tid.   Monitor BS ac/hs and use SSI for elevated  BS. Currently ranging from 78 to 134.  20. 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 Anemia of chronic disease/iron deficiency: Hgb-6.3 at admission--->9.1 s/p 4 units PRBC. Feraheme X 1 (additional dose held due to infection) and Aranesp weekly. Monitor with serial checks 13. HTN: Monitor BP tid--continue Coreg bid, Norvasc and apresoline tid. BP has been soft. HR is also soft. Decrease Coreg to 12.$RemoveBef'5mg'SzNSlawRSg$  BID.  14. IBS-D: Currently on senna bid, colace and miralax daily--has been refusing/will discontinue. She is  currently having diarrhea. Resume Lotrenex  today.  15. Malnutrition: Continue protein supplement.  16. Abdominal pain: Due to diarrhea? PPI resumed-->patient to have family bring in [place] from home.  17. Acute on chronic insomnia: Will try low dose elavil at bedtime. 18. Leukocytosis: 11.5 on 11/22, up from 10.6 11/21. Repeat tomorrow to trend.   Bary Leriche, PA-C 01/23/2020   I have personally performed a face to face diagnostic evaluation, including, but not limited to relevant history and physical exam findings, of this patient and developed relevant assessment and plan.  Additionally, I have reviewed and concur with the physician assistant's documentation above.  Leeroy Cha, MD

## 2020-01-23 NOTE — Plan of Care (Signed)
Patient for transfer to Rehab unit. Patient in agreement to rehab. Patient performing lt knee exercises. Pain at intervals. Incentive spirometer encouraged.. Report called to 4th floor rehab, Benjie Karvonen. Problem: Health Behavior/Discharge Planning: Goal: Ability to manage health-related needs will improve Outcome: Progressing   Problem: Clinical Measurements: Goal: Ability to maintain clinical measurements within normal limits will improve Outcome: Progressing Goal: Will remain free from infection Outcome: Progressing Goal: Diagnostic test results will improve Outcome: Progressing Goal: Respiratory complications will improve Outcome: Progressing Goal: Cardiovascular complication will be avoided Outcome: Progressing   Problem: Activity: Goal: Risk for activity intolerance will decrease Outcome: Progressing   Problem: Nutrition: Goal: Adequate nutrition will be maintained Outcome: Progressing   Problem: Coping: Goal: Level of anxiety will decrease Outcome: Progressing   Problem: Elimination: Goal: Will not experience complications related to bowel motility Outcome: Progressing Goal: Will not experience complications related to urinary retention Outcome: Progressing   Problem: Pain Managment: Goal: General experience of comfort will improve Outcome: Progressing   Problem: Safety: Goal: Ability to remain free from injury will improve Outcome: Progressing   Problem: Skin Integrity: Goal: Risk for impaired skin integrity will decrease Outcome: Progressing   Problem: Education: Goal: Verbalization of understanding the information provided (i.e., activity precautions, restrictions, etc) will improve Outcome: Progressing Goal: Individualized Educational Video(s) Outcome: Progressing   Problem: Activity: Goal: Ability to ambulate and perform ADLs will improve Outcome: Progressing   Problem: Clinical Measurements: Goal: Postoperative complications will be avoided or  minimized Outcome: Progressing   Problem: Self-Concept: Goal: Ability to maintain and perform role responsibilities to the fullest extent possible will improve Outcome: Progressing   Problem: Pain Management: Goal: Pain level will decrease Outcome: Progressing   Problem: Education: Goal: Knowledge of disease and its progression will improve Outcome: Progressing   Problem: Health Behavior/Discharge Planning: Goal: Ability to manage health-related needs will improve Outcome: Progressing   Problem: Clinical Measurements: Goal: Complications related to the disease process or treatment will be avoided or minimized Outcome: Progressing Goal: Dialysis access will remain free of complications Outcome: Progressing   Problem: Activity: Goal: Activity intolerance will improve Outcome: Progressing   Problem: Fluid Volume: Goal: Fluid volume balance will be maintained or improved Outcome: Progressing   Problem: Nutritional: Goal: Ability to make appropriate dietary choices will improve Outcome: Progressing   Problem: Respiratory: Goal: Respiratory symptoms related to disease process will be avoided Outcome: Progressing   Problem: Self-Concept: Goal: Body image disturbance will be avoided or minimized Outcome: Progressing   Problem: Urinary Elimination: Goal: Progression of disease will be identified and treated Outcome: Progressing

## 2020-01-23 NOTE — Progress Notes (Signed)
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 Will add metronidazole to cefepime to cover anaerobes. Recommend 6 weeks of antibiotics with dialysis from date of surgery with end date planned for 03/01/21. Plan to administer cefepime with hemodialysis and will give metronidazole orally. Continue wound care per orthopedics. Kendrick for discharge from ID standpoint. Will arrange follow up in clinic.  PLAN:  1. Continue current dose of cefepime with dialysis through 03/01/21.  2. Add metronidazole for anaerobic coverage.  3. Wound care per orthopedics. 4. Follow up in ID clinic  ID will sign off.   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 (feeding supplement) PROSource Plus  30 mL Oral BID BM  . sodium chloride   Intravenous Once  . amLODipine  10 mg Oral Daily  . atorvastatin  40 mg Oral q1800  . carvedilol  25 mg Oral BID WC  . Chlorhexidine Gluconate Cloth  6 each Topical Q0600  . [START ON 01/27/2020] darbepoetin (ARANESP) injection - DIALYSIS  100 mcg Intravenous Q Fri-HD  . dexlansoprazole  60 mg Oral Daily  . docusate sodium  100 mg Oral BID  . DULoxetine  60 mg Oral Daily  . enoxaparin (LOVENOX) injection  30 mg Subcutaneous Q24H  . hydrALAZINE  100 mg Oral TID  . insulin aspart  0-5 Units Subcutaneous QHS  . insulin aspart  0-9 Units  Subcutaneous TID WC  . insulin aspart  8 Units Subcutaneous TID WC  . insulin glargine  38 Units Subcutaneous QHS  . metroNIDAZOLE  500 mg Oral Q8H  . multivitamin  1 tablet Oral QHS  . polyethylene glycol  17 g Oral Daily  . senna  1 tablet Oral BID  . sevelamer carbonate  800 mg Oral TID WC    SUBJECTIVE:  Afebrile overnight with no acute events. No complaints.   Allergies  Allergen Reactions  . Lidocaine Itching  . Trazodone Swelling  . Latex Rash     Review of Systems: Review of Systems  Constitutional: Negative for chills, fever and weight loss.  Respiratory: Negative for cough, shortness of breath and wheezing.   Cardiovascular: Negative for chest pain and leg swelling.  Gastrointestinal: Negative for abdominal pain, constipation, diarrhea, nausea and vomiting.  Skin: Negative for rash.      OBJECTIVE: Vitals:   01/22/20 1819 01/22/20 1940 01/23/20 0529 01/23/20 0549  BP: 128/69 (!) 119/44 (!) 127/52   Pulse: 63 64 (!) 59   Resp: 18 20 20    Temp: (!) 97.5 F (36.4 C) 98.8 F (37.1 C) 98.1 F (36.7 C)   TempSrc: Oral Oral    SpO2: 97% 100% 95%   Weight:    88.4 kg  Height:       Body mass index is 27.96  kg/m.  Physical Exam Constitutional:      General: She is not in acute distress.    Appearance: She is well-developed.     Comments: Lying in bed with head of bed elevated; pleasant.   Cardiovascular:     Rate and Rhythm: Normal rate and regular rhythm.     Heart sounds: Normal heart sounds.  Pulmonary:     Effort: Pulmonary effort is normal.     Breath sounds: Normal breath sounds.  Skin:    General: Skin is warm and dry.  Neurological:     Mental Status: She is alert and oriented to person, place, and time.     Lab Results Lab Results  Component Value Date   WBC 11.5 (H) 01/23/2020   HGB 9.4 (L) 01/23/2020   HCT 30.6 (L) 01/23/2020   MCV 92.2 01/23/2020   PLT 277 01/23/2020    Lab Results  Component Value Date   CREATININE 5.41 (H)  01/22/2020   BUN 34 (H) 01/22/2020   NA 130 (L) 01/22/2020   K 3.8 01/22/2020   CL 95 (L) 01/22/2020   CO2 24 01/22/2020    Lab Results  Component Value Date   ALT 6 01/07/2020   AST 21 01/07/2020   ALKPHOS 223 (H) 01/07/2020   BILITOT 0.7 01/07/2020     Microbiology: Recent Results (from the past 240 hour(s))  Aerobic/Anaerobic Culture (surgical/deep wound)     Status: None   Collection Time: 01/19/20  2:24 PM   Specimen: PATH Other; Tissue  Result Value Ref Range Status   Specimen Description ABSCESS LEFT HIP  Final   Special Requests PT ON ANCEF  Final   Gram Stain   Final    ABUNDANT WBC PRESENT, PREDOMINANTLY PMN MODERATE GRAM NEGATIVE RODS RARE GRAM POSITIVE COCCI RARE GRAM POSITIVE RODS    Culture   Final    RARE ENTEROBACTER CLOACAE MODERATE BACTEROIDES FRAGILIS BETA LACTAMASE POSITIVE Performed at [facility], [place] [address] [place], [place] [address]    Report Status 01/22/2020 FINAL  Final   Organism ID, Bacteria ENTEROBACTER CLOACAE  Final      Susceptibility   Enterobacter cloacae - MIC*    CEFAZOLIN >=64 RESISTANT Resistant     CEFEPIME 0.25 SENSITIVE Sensitive     CEFTAZIDIME 8 SENSITIVE Sensitive     CIPROFLOXACIN <=0.25 SENSITIVE Sensitive     GENTAMICIN <=1 SENSITIVE Sensitive     IMIPENEM <=0.25 SENSITIVE Sensitive     TRIMETH/SULFA <=20 SENSITIVE Sensitive     PIP/TAZO 32 INTERMEDIATE Intermediate     * RARE ENTEROBACTER CLOACAE     Terri Piedra, NP [facility]  01/23/2020  2:52 PM

## 2020-01-23 NOTE — Discharge Summary (Signed)
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 MODERATE GRAM NEGATIVE RODS RARE GRAM POSITIVE COCCI RARE GRAM POSITIVE RODS    Culture   Final    RARE ENTEROBACTER CLOACAE MODERATE BACTEROIDES FRAGILIS BETA LACTAMASE POSITIVE Performed at [facility], [place] [address] [place], [place] [address]    Report Status 01/22/2020 FINAL  Final   Organism ID, Bacteria ENTEROBACTER CLOACAE  Final      Susceptibility   Enterobacter cloacae - MIC*    CEFAZOLIN >=64 RESISTANT Resistant     CEFEPIME 0.25 SENSITIVE Sensitive     CEFTAZIDIME 8 SENSITIVE Sensitive     CIPROFLOXACIN <=0.25 SENSITIVE Sensitive     GENTAMICIN <=1 SENSITIVE  Sensitive     IMIPENEM <=0.25 SENSITIVE Sensitive     TRIMETH/SULFA <=20 SENSITIVE Sensitive     PIP/TAZO 32 INTERMEDIATE Intermediate     * RARE ENTEROBACTER CLOACAE     Time coordinating discharge in minutes: 65  SIGNED:   Debbe Odea, MD  [facility] 01/23/2020, 10:53 AM

## 2020-01-23 NOTE — Progress Notes (Signed)
Subjective:  Patient reports pain as mild to moderate.  Denies N/V/CP/SOB. No c/o currently  Objective:   VITALS:   Vitals:   01/22/20 1819 01/22/20 1940 01/23/20 0529 01/23/20 0549  BP: 128/69 (!) 119/44 (!) 127/52   Pulse: 63 64 (!) 59   Resp: 18 20 20    Temp: (!) 97.5 F (36.4 C) 98.8 F (37.1 C) 98.1 F (36.7 C)   TempSrc: Oral Oral    SpO2: 97% 100% 95%   Weight:    88.4 kg  Height:      JP: 20 cc last 24 hrs   NAD ABD soft Incision: scant drainage Compartment soft Cellulitis same as 11/20 Stump protector in place JP ss   Lab Results  Component Value Date   WBC 11.5 (H) 01/23/2020   HGB 9.4 (L) 01/23/2020   HCT 30.6 (L) 01/23/2020   MCV 92.2 01/23/2020   PLT 277 01/23/2020   BMET    Component Value Date/Time   NA 130 (L) 01/22/2020 1503   NA 140 10/29/2018 1439   K 3.8 01/22/2020 1503   CL 95 (L) 01/22/2020 1503   CO2 24 01/22/2020 1503   GLUCOSE 135 (H) 01/22/2020 1503   BUN 34 (H) 01/22/2020 1503   BUN 43 (H) 10/29/2018 1439   CREATININE 5.41 (H) 01/22/2020 1503   CREATININE 1.92 (H) 01/10/2016 1528   CALCIUM 8.4 (L) 01/22/2020 1503   CALCIUM 8.3 (L) 01/11/2020 0139   GFRNONAA 9 (L) 01/22/2020 1503   GFRNONAA 30 (L) 01/10/2016 1528   GFRAA 13 (L) 12/17/2018 0907   GFRAA 35 (L) 01/10/2016 1528    Recent Results (from the past 240 hour(s))  Aerobic/Anaerobic Culture (surgical/deep wound)     Status: None   Collection Time: 01/19/20  2:24 PM   Specimen: PATH Other; Tissue  Result Value Ref Range Status   Specimen Description ABSCESS LEFT HIP  Final   Special Requests PT ON ANCEF  Final   Gram Stain   Final    ABUNDANT WBC PRESENT, PREDOMINANTLY PMN MODERATE GRAM NEGATIVE RODS RARE GRAM POSITIVE COCCI RARE GRAM POSITIVE RODS    Culture   Final    RARE ENTEROBACTER CLOACAE MODERATE BACTEROIDES FRAGILIS BETA LACTAMASE POSITIVE Performed at [facility], [place] [address], [place], [place] [address]    Report Status 01/22/2020  FINAL  Final   Organism ID, Bacteria ENTEROBACTER CLOACAE  Final      Susceptibility   Enterobacter cloacae - MIC*    CEFAZOLIN >=64 RESISTANT Resistant     CEFEPIME 0.25 SENSITIVE Sensitive     CEFTAZIDIME 8 SENSITIVE Sensitive     CIPROFLOXACIN <=0.25 SENSITIVE Sensitive     GENTAMICIN <=1 SENSITIVE Sensitive     IMIPENEM <=0.25 SENSITIVE Sensitive     TRIMETH/SULFA <=20 SENSITIVE Sensitive     PIP/TAZO 32 INTERMEDIATE Intermediate     * RARE ENTEROBACTER CLOACAE       Assessment/Plan: 4 Days Post-Op   Principal Problem:   Fracture Active Problems:   Diabetes mellitus type 2, uncontrolled, with complications (HCC)   CKD (chronic kidney disease) stage 5, GFR less than 15 ml/min (HCC)   AKI (acute kidney injury) (HCC)   Volume overload   Acute on chronic anemia   Chronic osteomyelitis involving left ankle and foot (HCC)   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, [place], [place], [place] [address] Phone: [phone] [email or web address] Facebook  [employer]

## 2020-01-23 NOTE — Anesthesia Postprocedure Evaluation (Signed)
Anesthesia Post Note  Patient: [name]  Procedure(s) Performed: IRRIGATION AND DEBRIDEMENT Left Hip (Left )     Patient location during evaluation: PACU Anesthesia Type: General Level of consciousness: awake and alert Pain management: pain level controlled Vital Signs Assessment: post-procedure vital signs reviewed and stable Respiratory status: spontaneous breathing, nonlabored ventilation and respiratory function stable Cardiovascular status: blood pressure returned to baseline and stable Postop Assessment: no apparent nausea or vomiting Anesthetic complications: no   No complications documented.  Last Vitals:  Vitals:   01/22/20 1940 01/23/20 0529  BP: (!) 119/44 (!) 127/52  Pulse: 64 (!) 59  Resp: 20 20  Temp: 37.1 C 36.7 C  SpO2: 100% 95%    Last Pain:  Vitals:   01/23/20 0153  TempSrc:   PainSc: [place] Luisantonio Adinolfi

## 2020-01-23 NOTE — Progress Notes (Signed)
Patient arrived on unit, oriented to unit. Reviewed medications, therapy schedule, rehab routine and plan of care. States an understanding of information reviewed. No complications noted at this time. Patient reports no pain and is AX4 [name]  

## 2020-01-23 NOTE — Progress Notes (Signed)
Izora Ribas, MD  Physician  Physical Medicine and Rehabilitation  PMR Pre-admission      Signed  Date of Service:  01/17/2020  3:54 PM      Related encounter: ED to Hosp-Admission (Current) from 12/28/2019 in [place]      Signed       Show:Clear all [x]Manual[x]Template[]Copied  Added by: [x]Jadarious Dobbins, Vertis Kelch, RN[x]Raulkar, Clide Deutscher, MD  []Hover for details PMR Admission Coordinator Pre-Admission Assessment   Patient: [name] is an 54 y.o., female MRN: [id number] DOB: October 10, 1965 Height: 5' 10" (177.8 cm) Weight: 88.4 kg   Insurance Information HMO:     PPO: yes     PCP:      IPA:      80/20:      OTHER:  PRIMARY: [place] Medicaid Healthy Blue      Policy#: [id number]      Subscriber: pt CM Name: approved on expedited appeal per VM   Phone#: [phone]     Fax#: [phone] Pre-Cert#: [id number]    Approved for 13 days when updates are due   Employer:  Benefits:  Phone #: via Availity     Name: 11/16 Eff. Date: 09/01/2019 until 01/31/2020     Deduct: none      Out of Pocket Max: none CIR: covered per medicaid guidelines      SNF: covered per medicaid guidelines; days limited by medical neccesity Outpatient: $30 per visit     Co-Pay: visits per medical neccesity Home Health: 100%      Co-Pay: visits per medical neccesity DME: 100 %      Co-Pay: per medical neccesity Providers: in network  SECONDARY: none      Policy#:      Phone#:    Development worker, community:       Phone#:    The Engineer, petroleum" for patients in Inpatient Rehabilitation Facilities with attached "Privacy Act [place] Records" was provided and verbally reviewed with: N/A   Emergency Contact Information         Contact Information     Name Relation Home Work [place], [name] Daughter     [phone]         Current Medical History  Patient Admitting Diagnosis: BKA, new ESRD on hemodialysis   History of  Present Illness: 54 year old female with past medical history of diabetes, HTN, CKD 5, s/p left foot [place] amputation 2014, an left SFA revascularization 12/17/2018, stroke who presented on 12/28/2019 by EMS from home after falling on her left hip on 1/15 after tripping with worsening left hip pain and inability to bear weight. Has had difficulty in ambulating due to her left foot amputation.    Initially admitted for left hip fracture and underwent IM nailing at [facility] on 12/29/2019. Hospitalization complicated by worsening leukocytosis of up to 35,000. Noted drainage from her left foot amputation site and Ancef was started. ID consulted. MRI on 01/08/2020 revealed an abscess and osteomyelitis. 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  Consulted Dr. Lyla Glassing on 11/18 due to purulent drainage form left hip surgical site. S/p I and D of left hip abscess on 11/18. Awaiting final culture sensitivity prior to calling ID for antibiotic choice and duration recommendation. JP drain to be removed today 11/22 per ortho. Cultures growing enterobacter Cloacae and ID updated recommendation for prolonged course of Cefepime of 4 weeks with dialysis.    Patient with acute  kidney injury on chronic kidney disease, stage V which has progressed to ESRD. Nephrology consulted. Failed lasix trial, treated on bicarb infusion. Status post tunnel catheter on 11/8 with dialysis began on 01/10/2020 when patient transferred to [facility]. Renal navigator has obtained HD seat in [place].schedule to be MWF at 1210.   Patient uses Lantus and Novolog at home. Hgb A1c 6.2. continuing Lantus, insulin sliding scale, along with 8 UNITS with meals and CBG monitoring. Acute on chronic anemia and patient has received transfusions as well as Feraheme. DVT prophylaxis with Lovenox.   Patient's medical record from [facility] and [facility] has been reviewed by the rehabilitation admission coordinator and  physician.   Past Medical History      Past Medical History:  Diagnosis Date  . Anxiety 2002  . Chest tightness    . Depression 2001  . Diabetes type 1, uncontrolled (HCC)      at age 24  . Diabetic neuropathy (HCC)    . Essential hypertension 2015  . GERD (gastroesophageal reflux disease)      about age of 32  . Nausea and vomiting in adult    . Stroke (HCC)    . Urinary frequency    . Yeast vaginitis        Family History   family history includes Diabetes in her father, mother, and sister; Heart attack in her father and mother; Heart disease in her father and mother; Hyperlipidemia in her mother; Hypertension in her mother and sister.   Prior Rehab/Hospitalizations Has the patient had prior rehab or hospitalizations prior to admission? Yes   Has the patient had major surgery during 100 days prior to admission? Yes              Current Medications   Current Facility-Administered Medications:  .  (feeding supplement) PROSource Plus liquid 30 mL, 30 mL, Oral, BID BM, Swinteck, Brian, MD, 30 mL at 01/22/20 1323 .  0.9 %  sodium chloride infusion (Manually program via Guardrails IV Fluids), , Intravenous, Once, Schertz, Robert, MD .  0.9 %  sodium chloride infusion, , Intravenous, PRN, Swinteck, Brian, MD, Stopped at 01/09/20 0759 .  0.9 %  sodium chloride infusion, , Intravenous, Continuous, Swinteck, Brian, MD, Last Rate: 10 mL/hr at 01/10/20 1510, New Bag at 01/19/20 1423 .  acetaminophen (TYLENOL) tablet 325-650 mg, 325-650 mg, Oral, Q6H PRN, Swinteck, Brian, MD, 650 mg at 01/23/20 0849 .  amLODipine (NORVASC) tablet 10 mg, 10 mg, Oral, Daily, Swinteck, Brian, MD, 10 mg at 01/22/20 0922 .  atorvastatin (LIPITOR) tablet 40 mg, 40 mg, Oral, q1800, Swinteck, Brian, MD, 40 mg at 01/22/20 1849 .  calcium carbonate (TUMS - dosed in mg elemental calcium) chewable tablet 200 mg of elemental calcium, 1 tablet, Oral, TID PRN, Swinteck, Brian, MD, 200 mg of elemental calcium at 12/30/19  0834 .  carvedilol (COREG) tablet 25 mg, 25 mg, Oral, BID WC, Swinteck, Brian, MD, 25 mg at 01/23/20 0845 .  ceFEPIme (MAXIPIME) 1 g in sodium chloride 0.9 % 100 mL IVPB, 1 g, Intravenous, Q24H, Bitonti, Michael T, RPH, Paused at 01/22/20 1854 .  Chlorhexidine Gluconate Cloth 2 % PADS 6 each, 6 each, Topical, Q0600, Collins, Samantha G, PA-C .  cyclobenzaprine (FLEXERIL) tablet 10 mg, 10 mg, Oral, BID PRN, Swinteck, Brian, MD, 10 mg at 01/23/20 0849 .  [START ON 01/27/2020] Darbepoetin Alfa (ARANESP) injection 100 mcg, 100 mcg, Intravenous, Q Fri-HD, Collins, Samantha G, PA-C .  dexlansoprazole (  DEXILANT) capsule 60 mg, 60 mg, Oral, Daily, Swinteck, Aaron Edelman, MD, 60 mg at 01/15/20 1006 .  diphenhydrAMINE (BENADRYL) 12.5 MG/5ML elixir 12.5-25 mg, 12.5-25 mg, Oral, Q4H PRN, Swinteck, Aaron Edelman, MD .  docusate sodium (COLACE) capsule 100 mg, 100 mg, Oral, BID, Swinteck, Aaron Edelman, MD, 100 mg at 01/21/20 2201 .  DULoxetine (CYMBALTA) DR capsule 60 mg, 60 mg, Oral, Daily, Swinteck, Aaron Edelman, MD, 60 mg at 01/22/20 0914 .  enoxaparin (LOVENOX) injection 30 mg, 30 mg, Subcutaneous, Q24H, Swinteck, Aaron Edelman, MD, 30 mg at 01/23/20 0846 .  guaiFENesin-dextromethorphan (ROBITUSSIN DM) 100-10 MG/5ML syrup 5 mL, 5 mL, Oral, Q4H PRN, Swinteck, Aaron Edelman, MD .  hydrALAZINE (APRESOLINE) tablet 10 mg, 10 mg, Oral, Q6H PRN, Rod Can, MD, 10 mg at 12/31/19 0454 .  hydrALAZINE (APRESOLINE) tablet 100 mg, 100 mg, Oral, TID, Swinteck, Aaron Edelman, MD, 100 mg at 01/22/20 2209 .  HYDROmorphone (DILAUDID) injection 0.5-1 mg, 0.5-1 mg, Intravenous, Q4H PRN, Rod Can, MD, 1 mg at 01/22/20 1353 .  insulin aspart (novoLOG) injection 0-5 Units, 0-5 Units, Subcutaneous, QHS, Rod Can, MD, 3 Units at 01/21/20 2159 .  insulin aspart (novoLOG) injection 0-9 Units, 0-9 Units, Subcutaneous, TID WC, Rod Can, MD, 1 Units at 01/22/20 0926 .  insulin aspart (novoLOG) injection 8 Units, 8 Units, Subcutaneous, TID WC, Rod Can, MD,  8 Units at 01/23/20 0846 .  insulin glargine (LANTUS) injection 38 Units, 38 Units, Subcutaneous, QHS, Rod Can, MD, 38 Units at 01/22/20 2207 .  lip balm (CARMEX) ointment 1 application, 1 application, Topical, PRN, Swinteck, Aaron Edelman, MD .  loratadine (CLARITIN) tablet 10 mg, 10 mg, Oral, Daily PRN, Swinteck, Aaron Edelman, MD .  menthol-cetylpyridinium (CEPACOL) lozenge 3 mg, 1 lozenge, Oral, PRN **OR** phenol (CHLORASEPTIC) mouth spray 1 spray, 1 spray, Mouth/Throat, PRN, Swinteck, Aaron Edelman, MD .  metoCLOPramide (REGLAN) tablet 5-10 mg, 5-10 mg, Oral, Q8H PRN **OR** metoCLOPramide (REGLAN) injection 5-10 mg, 5-10 mg, Intravenous, Q8H PRN, Swinteck, Aaron Edelman, MD .  metroNIDAZOLE (FLAGYL) tablet 500 mg, 500 mg, Oral, Q8H, Mignon Pine, DO .  multivitamin (RENA-VIT) tablet 1 tablet, 1 tablet, Oral, QHS, Swinteck, Aaron Edelman, MD, 1 tablet at 01/22/20 2207 .  Muscle Rub CREA 1 application, 1 application, Topical, PRN, Swinteck, Aaron Edelman, MD .  ondansetron (ZOFRAN) tablet 4 mg, 4 mg, Oral, Q6H PRN **OR** ondansetron (ZOFRAN) injection 4 mg, 4 mg, Intravenous, Q6H PRN, Swinteck, Aaron Edelman, MD .  oxyCODONE (Oxy IR/ROXICODONE) immediate release tablet 10-15 mg, 10-15 mg, Oral, Q4H PRN, Rod Can, MD, 15 mg at 01/22/20 2312 .  oxyCODONE (Oxy IR/ROXICODONE) immediate release tablet 5-10 mg, 5-10 mg, Oral, Q4H PRN, Swinteck, Aaron Edelman, MD .  polyethylene glycol (MIRALAX / GLYCOLAX) packet 17 g, 17 g, Oral, Daily, Swinteck, Aaron Edelman, MD, 17 g at 01/18/20 1338 .  polyvinyl alcohol (LIQUIFILM TEARS) 1.4 % ophthalmic solution 1 drop, 1 drop, Both Eyes, PRN, Swinteck, Aaron Edelman, MD .  senna (SENOKOT) tablet 8.6 mg, 1 tablet, Oral, BID, Swinteck, Aaron Edelman, MD, 8.6 mg at 01/21/20 2201 .  sevelamer carbonate (RENVELA) tablet 800 mg, 800 mg, Oral, TID WC, Swinteck, Aaron Edelman, MD, 800 mg at 01/23/20 0845 .  sodium chloride (OCEAN) 0.65 % nasal spray 1 spray, 1 spray, Each Nare, PRN, Rod Can, MD   Patients Current Diet:     Diet Order                       Diet - low sodium heart healthy  Diet Carb Modified              Diet renal with fluid restriction Fluid restriction: 1200 mL Fluid; Room service appropriate? Yes; Fluid consistency: Thin  Diet effective now                      Precautions / Restrictions Precautions Precautions: Fall Precaution Comments: new L BKA Restrictions Weight Bearing Restrictions: Yes LLE Weight Bearing: Non weight bearing Other Position/Activity Restrictions: WBAT on Left hip but has been essentially NWB on L plantar PTA    Has the patient had 2 or more falls or a fall with injury in the past year? yes   Prior Activity Level Limited Community (1-2x/wk): mod I with knee walker   Prior Functional Level Self Care: Did the patient need help bathing, dressing, using the toilet or eating? Independent   Indoor Mobility: Did the patient need assistance with walking from room to room (with or without device)? Independent   Stairs: Did the patient need assistance with internal or external stairs (with or without device)? Independent   Functional Cognition: Did the patient need help planning regular tasks such as shopping or remembering to take medications? Independent   Home Assistive Devices / Equipment Home Assistive Devices/Equipment: CBG Meter, Other (Comment), Walker (specify type), Bedside commode/3-in-1, Shower chair without back (walk-in shower, tub/shower unit, toilet riser, front wheeled walker) Home Equipment: Walker - 2 wheels, Bedside commode, Toilet riser   Prior Device Use: Indicate devices/aids used by the patient prior to current illness, exacerbation or injury? knee walker   Current Functional Level Cognition   Overall Cognitive Status: Within Functional Limits for tasks assessed Orientation Level: Oriented X4 General Comments: Pt required encouragement to redirect to progression of activity.    Extremity Assessment (includes Sensation/Coordination)    Upper Extremity Assessment: Generalized weakness  Lower Extremity Assessment: LLE deficits/detail LLE Deficits / Details: L BKA     ADLs   Overall ADL's : Needs assistance/impaired Eating/Feeding: Independent, Sitting Grooming: Set up, Sitting, Wash/dry face Grooming Details (indicate cue type and reason): Washed face seated at side of bed with back unsupported. Upper Body Bathing: Set up, Sitting Lower Body Bathing: Maximal assistance, Cueing for safety, Bed level Upper Body Dressing : Set up, Sitting Lower Body Dressing: Maximal assistance, Sitting/lateral leans, Sit to/from stand Lower Body Dressing Details (indicate cue type and reason): d/t pain in L LE with mobility Toilet Transfer: Total assistance, +2 for physical assistance, +2 for safety/equipment Toilet Transfer Details (indicate cue type and reason): Total assist to transfer to BSc with use of Sara Stedy. +3 assistance for safety to manage patient, equipment, and patient's LLE due to pain. Toileting- Clothing Manipulation and Hygiene: Total assistance, Sitting/lateral lean Toileting - Clothing Manipulation Details (indicate cue type and reason): Total assist for toileting on BSC. Functional mobility during ADLs: Moderate assistance, +2 for physical assistance, +2 for safety/equipment General ADL Comments: Declining to perform ADLs at this time. Self limiting behavior     Mobility   Overal bed mobility: Needs Assistance Bed Mobility: Supine to Sit Rolling: Min assist Supine to sit: Mod assist Sit to supine: Mod assist, +2 for physical assistance General bed mobility comments: Assistance with LLE to edge of bed and for trunk elevation.  Once sitting edge of bed she was able to maintain her balance.     Transfers   Overall transfer level: Needs assistance Equipment used: Rolling walker (2 wheeled) Transfer via Lift Equipment: Stedy Transfers: Sit to/from   Stand Sit to Stand: Mod assist, +2 physical assistance Stand pivot  transfers: Mod assist, +2 physical assistance  Lateral/Scoot Transfers: Mod assist, +2 physical assistance, +2 safety/equipment General transfer comment: Pt stood x 1.5 min edge of bed with mod +1 to maintain.  Performed additional trial mod +2 to perform pericare.  Pt able to pivot on R foot to move up to HOB.     Ambulation / Gait / Stairs / Wheelchair Mobility   Ambulation/Gait Ambulation/Gait assistance:  (NT) General Gait Details: NT     Posture / Balance Dynamic Sitting Balance Sitting balance - Comments: sitting EOB with supervision Balance Overall balance assessment: Needs assistance, History of Falls Sitting-balance support: Bilateral upper extremity supported Sitting balance-Leahy Scale: Fair Sitting balance - Comments: sitting EOB with supervision Standing balance support: Bilateral upper extremity supported, During functional activity Standing balance-Leahy Scale: Poor Standing balance comment: reliant on UEs     Special needs/care consideration New ESRD to hemodialysis this admission Outpatient hemodialysis center [place] MWF Patient states daughter to provide transportation to Hemodialysis Patient states daughter checking with [employer] about ramp Patient address to be [address], [place], [place] [address] Acute CM has exhausted home health agency list per notes 11/15. No Home health arranged due to staffing or territory issues      Previous Home Environment  Living Arrangements: Children (daughter)  Lives With: Daughter Available Help at Discharge:  (daughter works day shift) Type of Home: Mobile home Home Layout: One level Home Access: Stairs to enter Entrance Stairs-Rails: None Entrance Stairs-Number of Steps: 3 Bathroom Shower/Tub: Walk-in shower Bathroom Toilet: Standard Bathroom Accessibility: Yes How Accessible: Accessible via walker Home Care Services: No Additional Comments: Pt unsure if bathroom has tub or shower and if doors are w/c accessable.    Discharge Living Setting Plans for Discharge Living Setting: Patient's home, Lives with (comment), Mobile Home (daughter) Type of Home at Discharge: Mobile home Discharge Home Layout: One level Discharge Home Access: Stairs to enter Entrance Stairs-Rails: None Entrance Stairs-Number of Steps: 3 Discharge Bathroom Shower/Tub: Walk-in shower Discharge Bathroom Toilet: Standard Discharge Bathroom Accessibility: Yes How Accessible: Accessible via walker Does the patient have any problems obtaining your medications?: No   Social/Family/Support Systems Contact Information: daughter, Kaitlym Anticipated Caregiver: daughter Anticipated Caregiver's Contact Information: see above Ability/Limitations of Caregiver: daughter works day shift Caregiver Availability: Evenings only Discharge Plan Discussed with Primary Caregiver: Yes Is Caregiver In Agreement with Plan?: Yes Does Caregiver/Family have Issues with Lodging/Transportation while Pt is in Rehab?: No   Goals Patient/Family Goal for Rehab: mod I with PT and OT at wheelchair level Expected length of stay: ELOS 7 to 10 days Additional Information: New ESRD on hemodialysis this admission Pt/Family Agrees to Admission and willing to participate: Yes Program Orientation Provided & Reviewed with Pt/Caregiver Including Roles  & Responsibilities: Yes   Decrease burden of Care through IP rehab admission: n/a   Possible need for SNF placement upon discharge: not anticipated   Patient Condition: I have reviewed medical records from [facility] and [facility] , spoken with CM, and patient. I met with patient at the bedside for inpatient rehabilitation assessment.  Patient will benefit from ongoing PT and OT, can actively participate in 3 hours of therapy a day 5 days of the week, and can make measurable gains during the admission.  Patient will also benefit from the coordinated team approach during an Inpatient Acute Rehabilitation  admission.  The patient will receive intensive therapy as well as   Rehabilitation physician, nursing, social worker, and care management interventions.  Due to bladder management, bowel management, safety, skin/wound care, disease management, medication administration, pain management and patient education the patient requires 24 hour a day rehabilitation nursing.  The patient is currently mod assist with mobility and basic ADLs.  Discharge setting and therapy post discharge at home with home health is anticipated.  Patient has agreed to participate in the Acute Inpatient Rehabilitation Program and will admit today.   Preadmission Screen Completed By:  Cleatrice Burke, 01/23/2020 11:04 AM ______________________________________________________________________   Discussed status with Dr. Ranell Patrick on  01/23/2020 at  1105 and received approval for admission today.   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 Does the need for close, 24 hr/day Medical supervision in concert with the patient's rehab needs make it unreasonable for this patient to be served in a less intensive setting? Yes 2. Co-Morbidities requiring supervision/potential complications: uncontrolled type 2 DM with complications, CKD stage 5, AKI, volume overload, acute on chronic anemia, IBS 3. Due to bladder management, bowel management, safety, skin/wound care, disease management, medication administration, pain management and patient education, does the patient require 24 hr/day rehab nursing? Yes 4. Does the patient require coordinated care of a physician, rehab nurse, PT, OT to address physical and functional deficits in the context of the above medical diagnosis(es)? Yes Addressing deficits in the following areas: balance, endurance, locomotion, strength, transferring, bowel/bladder control, bathing, dressing,  feeding, grooming, toileting and psychosocial support 5. Can the patient actively participate in an intensive therapy program of at least 3 hrs of therapy 5 days a week? Yes 6. The potential for patient to make measurable gains while on inpatient rehab is excellent 7. Anticipated functional outcomes upon discharge from inpatient rehab: min assist PT, min assist OT, independent SLP 8. Estimated rehab length of stay to reach the above functional goals is: 2-3 weeks 9. Anticipated discharge destination: Home 10. Overall Rehab/Functional Prognosis: excellent     MD Signature: Leeroy Cha, MD        Revision History                                    Note Details  Author Izora Ribas, MD File Time 01/23/2020 11:16 AM  Author Type Physician Status Signed  Last Editor Izora Ribas, MD Service Physical Medicine and Rehabilitation

## 2020-01-23 NOTE — H&P (Signed)
Physical Medicine and Rehabilitation Admission H&P    Chief Complaint  Patient presents with  . Functional deficits due to L-BKA    L hip infection.   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 She underwent IM nailing of Left hip by Dr. Doran Durand with Sq lovenox for DVT prophylaxis. Post op had worsening of renal status with fluid overload, metabolic acidosis, lethargy concerning for uremia as well as progressive leucocytosis but declined MRI of left foot initially. MRI was done 11/7 and showed 3.3 cm X 2.9 cm abscess with extensive osteomyelitis and numerous small bone abscesses. She was started on IV ancef.  Due to acute on chronic renal failure tunneled catheter was placed by Dr. Stanford Breed on 11/08 and HD initiated.   Dr. Doran Durand consulted for input and recommended L-BKA for definitive treatment. Patient was agreeable to undergo L-BKA on 11/11 and is NWB LLE with stump sock/stump protector. She was found to develop purulent drainage from left hip incision on 11/18 and underwent I& D of hip abscess with debridement of skin, SQ tissue and muscle with drain placement by Dr. Lyla Glassing. Dr. Linus Salmons consulted for input on antibiotic coverage as wound culture positive for enterobacter cloacae--->now on Cefepime and flagyl for 4 weeks. pain control improving--not taking IV dilaudid since yesterday. Penrose drain removed from left hip today. Therapy ongoing and patient limited by weakness, anxiety and pain affecting ADLs and mobility.    CIR recommended due to functional decline.     Review of Systems  Constitutional: Negative for chills and fever.  HENT: Negative for hearing loss and tinnitus.   Eyes: Negative for blurred vision.  Respiratory: Negative for cough and shortness of breath.   Cardiovascular: Negative for chest  pain and palpitations.  Gastrointestinal: Positive for abdominal pain, diarrhea and heartburn.  Musculoskeletal: Positive for joint pain and myalgias.  Skin: Negative for rash.  Neurological: Positive for sensory change (numbness/tingling bilateral hands/feet) and weakness. Negative for dizziness and headaches.  Psychiatric/Behavioral: The patient has insomnia (chronic).      Past Medical History:  Diagnosis Date  . Anxiety 2002  . Chest tightness   . Depression 2001  . Diabetes type 1, uncontrolled ([place])    at age 39  . Diabetic neuropathy ([place])   . Essential hypertension 2015  . GERD (gastroesophageal reflux disease)    about age of 74  . Nausea and vomiting in adult   . Stroke ([place])   . Urinary frequency   . Yeast vaginitis     Past Surgical History:  Procedure Laterality Date  . ACHILLES TENDON SURGERY Left 03/10/2013   Procedure: LEFT CHOPART AMPUTATION/ LEFT TENDON ACHILLES [place];  Surgeon: Wylene Simmer, MD;  Location: [place];  Service: Orthopedics;  Laterality: Left;  . AMPUTATION Left 06/15/2012   Procedure: AMPUTATION DIGIT;  Surgeon: Meredith Pel, MD;  Location: [place];  Service: Orthopedics;  Laterality: Left;  Left great toe revision amputation  . AMPUTATION Left 01/12/2020   Procedure: AMPUTATION BELOW KNEE;  Surgeon: Wylene Simmer, MD;  Location: [place];  Service: Orthopedics;  Laterality: Left;  . AV FISTULA PLACEMENT Left 01/09/2020   Procedure: LEFT UPPER ARM ARTERIOVENOUS (AV) FISTULA CREATION;  Surgeon: Cherre Robins, MD;  Location: [place];  Service: Vascular;  Laterality: Left;  .  ENDOMETRIAL ABLATION    . I & D EXTREMITY Left 01/19/2020   Procedure: IRRIGATION AND DEBRIDEMENT Left Hip;  Surgeon: Rod Can, MD;  Location: [place];  Service: Orthopedics;  Laterality: Left;  . INSERTION OF DIALYSIS CATHETER Right 01/09/2020   Procedure: INSERTION OF DIALYSIS CATHETER;  Surgeon: Cherre Robins, MD;  Location: [place];  Service: Vascular;  Laterality:  Right;  . INTRAMEDULLARY (IM) NAIL INTERTROCHANTERIC Left 12/29/2019   Procedure: INTRAMEDULLARY (IM) NAIL INTERTROCHANTRIC;  Surgeon: Rod Can, MD;  Location: [facility];  Service: Orthopedics;  Laterality: Left;  . IR ANGIOGRAM EXTREMITY LEFT  12/17/2018  . IR FEM POP ART ATHERECT INC PTA MOD SED  12/17/2018  . IR RADIOLOGIST EVAL & MGMT  11/30/2018  . IR RADIOLOGIST EVAL & MGMT  12/09/2018  . IR RADIOLOGIST EVAL & MGMT  02/08/2019  . IR US GUIDE VASC ACCESS RIGHT  12/17/2018  . LAPAROSCOPIC CHOLECYSTECTOMY  01/30/2015   [facility] day surgery     Family History  Problem Relation Age of Onset  . Diabetes Mother   . Heart attack Mother   . Heart disease Mother        before age 16  . Hypertension Mother   . Hyperlipidemia Mother   . Diabetes Father   . Heart attack Father   . Heart disease Father        before age 58  . Diabetes Sister   . Hypertension Sister     Social History:  Lives with daughter. She wasas using knee walker PTA--had to be NWB X 1 months due to foot infection. She was working prior to Orthoptist for [employer]. She reports that she has been smoking cigarettes--1/2 PPD. She has a 5.50 pack-year smoking history. She has never used smokeless tobacco. She reports current alcohol use. She reports that she does not use drugs.   Allergies  Allergen Reactions  . Trazodone Swelling  . Latex Rash  . Lidocaine Itching    Medications Prior to Admission  Medication Sig Dispense Refill  . acetaminophen (TYLENOL) 500 MG tablet Take 1,000 mg by mouth every 6 (six) hours as needed for mild pain, fever or headache.    . alosetron (LOTRONEX) 1 MG tablet Take 1 mg by mouth 2 (two) times daily.    Marland Kitchen amLODipine (NORVASC) 5 MG tablet Take 1 tablet (5 mg total) by mouth daily. 30 tablet 5  . aspirin EC 81 MG EC tablet Take 1 tablet (81 mg total) by mouth daily. 30 tablet 0  . atorvastatin (LIPITOR) 40 MG tablet TAKE 1 TABLET (40 MG TOTAL) BY MOUTH DAILY AT 6 PM. 30  tablet 1  . carvedilol (COREG) 6.25 MG tablet Take 6.25 mg by mouth 2 (two) times daily.    Marland Kitchen CINNAMON PO Take 1 tablet by mouth daily.    . cyclobenzaprine (FLEXERIL) 10 MG tablet TAKE 1 TABLET AT BEDTIME AS NEEDED FOR MUSCLE SPASM (Patient taking differently: Take 10 mg by mouth at bedtime as needed for muscle spasms. ) 30 tablet 5  . DEXILANT 60 MG capsule Take 60 mg by mouth daily.  6  . DULoxetine (CYMBALTA) 60 MG capsule TAKE 1 CAPSULE BY MOUTH EVERY DAY. Please make PCP appointment. (Patient taking differently: Take 60 mg by mouth daily. ) 30 capsule 0  . hydrALAZINE (APRESOLINE) 50 MG tablet TAKE 1 TABLET BY MOUTH THREE TIMES A DAY (Patient taking differently: Take 50 mg by mouth 3 (three) times daily. ) 30 tablet 0  .  insulin aspart (NOVOLOG FLEXPEN) 100 UNIT/ML FlexPen Inject 10 Units into the skin 3 (three) times daily with meals. 15 mL 4  . LANTUS SOLOSTAR 100 UNIT/ML Solostar Pen INJECT 50 UNITS SUBCUTANEOUSLY EVERY DAY (Patient taking differently: Inject 20 Units into the skin daily. ) 15 mL 3  . MAGNESIUM PO Take 1 tablet by mouth daily.    . Oxycodone HCl 10 MG TABS Take 10 mg by mouth 3 (three) times daily as needed (pain).     . Blood Glucose Monitoring Suppl (ACCU-CHEK AVIVA PLUS) w/Device KIT CHECK BLOOD SUGARS THREE TIMES A DAY.  DX:250.03 1 kit 0  . calcium citrate-vitamin D 500-400 MG-UNIT chewable tablet Chew 1 tablet by mouth 2 (two) times daily. (Patient not taking: Reported on 12/28/2019) 180 tablet 1  . glucose blood (ACCU-CHEK AVIVA PLUS) test strip USE TO CHECK BLOOD SUGAR BEFORE MEALS AND AT BEDTIME dx code E10.65 125 each 6  . Insulin Pen Needle (TRUEPLUS PEN NEEDLES) 32G X 4 MM MISC Use to inject insulin as directed. 100 each 11    Drug Regimen Review  Drug regimen was reviewed and remains appropriate with no significant issues identified  Home: Home Living Family/patient expects to be discharged to:: Private residence Living Arrangements: Children  (daughter) Available Help at Discharge:  (daughter works day shift) Type of Home: Mobile home Home Access: Stairs to enter Technical brewer of Steps: 3 Entrance Stairs-Rails: None Home Layout: One level Bathroom Shower/Tub: Multimedia programmer: Standard Bathroom Accessibility: Yes Home Equipment: Environmental consultant - 2 wheels, Bedside commode, Toilet riser Additional Comments: Pt unsure if bathroom has tub or shower and if doors are w/c accessable.  Lives With: Daughter   Functional History: Prior Function Level of Independence: Independent with assistive device(s) Comments: Using knee walker  Functional Status:  Mobility: Bed Mobility Overal bed mobility: Needs Assistance Bed Mobility: Supine to Sit Rolling: Min assist Supine to sit: Mod assist Sit to supine: Mod assist, +2 for physical assistance General bed mobility comments: Assistance with LLE to edge of bed and for trunk elevation.  Once sitting edge of bed she was able to maintain her balance. Transfers Overall transfer level: Needs assistance Equipment used: Rolling walker (2 wheeled) Transfer via Lift Equipment: Stedy Transfers: Sit to/from Stand Sit to Stand: Mod assist, +2 physical assistance Stand pivot transfers: Mod assist, +2 physical assistance  Lateral/Scoot Transfers: Mod assist, +2 physical assistance, +2 safety/equipment General transfer comment: Pt stood x 1.5 min edge of bed with mod +1 to maintain.  Performed additional trial mod +2 to perform pericare.  Pt able to pivot on R foot to move up to [facility]. Ambulation/Gait Ambulation/Gait assistance:  (NT) General Gait Details: NT  ADL: ADL Overall ADL's : Needs assistance/impaired Eating/Feeding: Independent, Sitting Grooming: Set up, Sitting, Wash/dry face Grooming Details (indicate cue type and reason): Washed face seated at side of bed with back unsupported. Upper Body Bathing: Set up, Sitting Lower Body Bathing: Maximal assistance, Cueing for  safety, Bed level Upper Body Dressing : Set up, Sitting Lower Body Dressing: Maximal assistance, Sitting/lateral leans, Sit to/from stand Lower Body Dressing Details (indicate cue type and reason): d/t pain in L LE with mobility Toilet Transfer: Total assistance, +2 for physical assistance, +2 for safety/equipment Toilet Transfer Details (indicate cue type and reason): Total assist to transfer to BSc with use of [name]. +3 assistance for safety to manage patient, equipment, and patient's LLE due to pain. Toileting- Clothing Manipulation and Hygiene: Total assistance, Sitting/lateral lean Toileting -  Clothing Manipulation Details (indicate cue type and reason): Total assist for toileting on BSC. Functional mobility during ADLs: Moderate assistance, +2 for physical assistance, +2 for safety/equipment General ADL Comments: Declining to perform ADLs at this time. Self limiting behavior  Cognition: Cognition Overall Cognitive Status: Within Functional Limits for tasks assessed Orientation Level: Oriented X4 Cognition Arousal/Alertness: Awake/alert Behavior During Therapy: Anxious, WFL for tasks assessed/performed Overall Cognitive Status: Within Functional Limits for tasks assessed General Comments: Pt required encouragement to redirect to progression of activity.   Blood pressure (!) 116/41, pulse 60, temperature 98.7 F (37.1 C), resp. rate 18, height _0  (1.778 m), weight 88.4 kg, SpO2 96 %. Physical Exam General: Alert and oriented x 3, No apparent distress HEENT: Head is normocephalic, atraumatic, PERRLA, EOMI, sclera anicteric, oral mucosa pink and moist, dentition intact, ext ear canals clear,  Neck: Supple without JVD or lymphadenopathy Heart: Reg rate and rhythm. No murmurs rubs or gallops Chest: CTA bilaterally without wheezes, rales, or rhonchi; no distress Abdomen: Soft, non-tender, non-distended, bowel sounds positive. Extremities: No clubbing, cyanosis, or edema.  Pulses are 2+ Skin: Clean and intact without signs of breakdown Neuro: Pt is cognitively appropriate with normal insight, memory, and awareness. Cranial nerves 2-12 are intact.  MSK: L-BKA with compressive dressing. Left hip with two small sutured incision. Prior puncture due to penrose with moderate  serosanguinous drainage on dressing. 1+ pedal edema on right. LUE with dry dressing--no edema.   Psych: Pt's affect is appropriate. Pt is cooperative  Results for orders placed or performed during the hospital encounter of 01/23/20 (from the past 48 hour(s))  Glucose, capillary     Status: Abnormal   Collection Time: 01/23/20  4:41 PM  Result Value Ref Range   Glucose-Capillary 146 (H) 70 - 99 mg/dL    Comment: Glucose reference range applies only to samples taken after fasting for at least 8 hours.   Comment 1 Notify RN    No results found.     Medical Problem List and Plan: 1.  Impaired mobility and ADLs secondary to L BKA  -patient may not shower  -ELOS/Goals: 2-3 weeks MinA 2.  Antithrombotics: -DVT/anticoagulation:  Pharmaceutical: Lovenox  -antiplatelet therapy: N/A 3. Chronic pain/Pain Management: Was on oxycodone 5 mg tid prn as well Cymbalta for neuropathy (at baseline).  4. Mood: Team to provide ego support/encouragement.   -antipsychotic agents: N/A 5. Neuropsych: This patient is capable of making decisions on her own behalf. 6. Skin/Wound Care: Monitor wound daily  7. Fluids/Electrolytes/Nutrition: Strict I/O. 1200 cc FR. Will consult dietician to educate on renal diet.  8. Left hip ORIF/hip abscess s/p I&D 11/18: NWB LLE. Enterobacter treated with Cefepime Day #3 and metronidazole D#1 9. 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  10 T1DM with neuropathy: Hgb A1c-6.2. Was on Lantus 50 units in am with 10 units novolog tid.   Monitor BS ac/hs and use SSI for elevated  BS. Currently ranging from 78 to 134.  94. 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 Anemia of chronic disease/iron deficiency: Hgb-6.3 at admission--->9.1 s/p 4 units PRBC. Feraheme X 1 (additional dose held due to infection) and Aranesp weekly. Monitor with serial checks 13. HTN: Monitor BP tid--continue Coreg bid, Norvasc and apresoline tid. BP has been soft. HR is also soft. Decrease Coreg to 12.67m BID.  14. IBS-D: Currently on senna bid, colace and miralax daily--has been refusing/will discontinue. She is currently having diarrhea.  Resume Lotrenex  today.  15. Malnutrition: Continue protein supplement.  16. Abdominal pain: Due to diarrhea? PPI resumed-->patient to have family bring in [place] from home.  17. Acute on chronic insomnia: Will try low dose elavil at bedtime. 18. Leukocytosis: 11.5 on 11/22, up from 10.6 11/21. Repeat tomorrow to trend.   Bary Leriche, PA-C  I have personally performed a face to face diagnostic evaluation, including, but not limited to relevant history and physical exam findings, of this patient and developed relevant assessment and plan.  Additionally, I have reviewed and concur with the physician assistant's documentation above.  Leeroy Cha, MD

## 2020-01-23 NOTE — Progress Notes (Signed)
[facility] Progress Note   Subjective:   Reports dialysis went well yesterday. Hip pain is improving. She does have diarrhea which she reports is normal for her due to IBS. No CP, palpitations, SOB or dizziness.   Objective Vitals:   01/22/20 1819 01/22/20 1940 01/23/20 0529 01/23/20 0549  BP: 128/69 (!) 119/44 (!) 127/52   Pulse: 63 64 (!) 59   Resp: '18 20 20   ' Temp: (!) 97.5 F (36.4 C) 98.8 F (37.1 C) 98.1 F (36.7 C)   TempSrc: Oral Oral    SpO2: 97% 100% 95%   Weight:    88.4 kg  Height:       Physical Exam General:Well developed female, alert and in NAD Heart:RRR, no murmurs, rubs or gallops Lungs:CTA bilaterally without wheezing, rhonchi or rales Abdomen:Soft, non-tender, non-distended, +BS Extremities:trace edema RLE Dialysis Access:R IJ TDC, LUE AVF + thrill/bruit  Additional Objective Labs: Basic Metabolic Panel: Recent Labs  Lab 01/18/20 0605 01/19/20 0746 01/19/20 1858 01/20/20 0952 01/22/20 1503  NA 132*  --   --  131* 130*  K 4.1  --   --  4.2 3.8  CL 98  --   --  96* 95*  CO2 23  --   --  24 24  GLUCOSE 178*  --   --  234* 135*  BUN 37*  --   --  34* 34*  CREATININE 4.17*   < > 4.00* 4.92* 5.41*  CALCIUM 8.3*  --   --  8.1* 8.4*  PHOS 4.0  --   --  3.7 3.4   < > = values in this interval not displayed.   Liver Function Tests: Recent Labs  Lab 01/18/20 0605 01/20/20 0952 01/22/20 1503  ALBUMIN 1.7* 1.7* 1.7*   CBC: Recent Labs  Lab 01/19/20 0746 01/19/20 0746 01/19/20 1858 01/19/20 1858 01/20/20 0034 01/21/20 0607 01/22/20 0317  WBC 10.0   < > 15.5*   < > 10.7* 11.9* 10.6*  NEUTROABS  --   --   --   --  7.6 7.9* 6.7  HGB 7.7*   < > 7.9*   < > 7.3* 9.1* 9.1*  HCT 25.3*   < > 26.9*   < > 24.8* 28.9* 29.0*  MCV 91.7  --  93.1  --  92.5 90.9 91.2  PLT 296   < > 283   < > 282 265 255   < > = values in this interval not displayed.   Blood Culture    Component Value Date/Time   SDES ABSCESS LEFT HIP  01/19/2020 1424   SPECREQUEST PT ON ANCEF 01/19/2020 1424   CULT  01/19/2020 1424    RARE ENTEROBACTER CLOACAE MODERATE BACTEROIDES FRAGILIS BETA LACTAMASE POSITIVE Performed at [facility], [place] [address] [place], [place] [address]    REPTSTATUS 01/22/2020 FINAL 01/19/2020 1424    CBG: Recent Labs  Lab 01/22/20 0729 01/22/20 1211 01/22/20 1814 01/22/20 2048 01/23/20 0759  GLUCAP 145* 92 93 120* 78   Medications: . sodium chloride Stopped (01/09/20 0759)  . sodium chloride 10 mL/hr at 01/10/20 1510  . ceFEPime (MAXIPIME) IV Stopped (01/22/20 1854)   . (feeding supplement) PROSource Plus  30 mL Oral BID BM  . sodium chloride   Intravenous Once  . amLODipine  10 mg Oral Daily  . atorvastatin  40 mg Oral q1800  . carvedilol  25 mg Oral BID WC  . Chlorhexidine Gluconate Cloth  6 each Topical Daily  . [  START ON 01/27/2020] darbepoetin (ARANESP) injection - DIALYSIS  100 mcg Intravenous Q Fri-HD  . dexlansoprazole  60 mg Oral Daily  . docusate sodium  100 mg Oral BID  . DULoxetine  60 mg Oral Daily  . enoxaparin (LOVENOX) injection  30 mg Subcutaneous Q24H  . hydrALAZINE  100 mg Oral TID  . insulin aspart  0-5 Units Subcutaneous QHS  . insulin aspart  0-9 Units Subcutaneous TID WC  . insulin aspart  8 Units Subcutaneous TID WC  . insulin glargine  38 Units Subcutaneous QHS  . multivitamin  1 tablet Oral QHS  . polyethylene glycol  17 g Oral Daily  . senna  1 tablet Oral BID  . sevelamer carbonate  800 mg Oral TID WC   Home meds: - norvasc 5/ coreg 6.25/ asa / lipitor / hydralazine 50 tid - insulin lantus 50u/ aspart 10u tid - oxy IR prn/ cymbalta qd - dexilant/ flexeril prn/ protonix 40   CXR - no acute disease Last creat's outpatient >> Aug 2021 - creat 4.9 Dec 23, 2019 - creat 5.2   Assessment/Plan: 1. AoCKD 5 now ESRD- b/l creat 4.9- 5.2, eGFR 9- 11 from 8/21- 10/21. Creat 6 on admission, then mid 7's, IVF's given w/o  improvementso started HD. S/p TDC and AVF on11/8, 1st HD 11/9, HD#2 11/10, #3 11/12.Continue MWF schedule but will dialyze Sunday, Tuesday, Friday this week due to holiday.No heparin.  2. Anemia ckd - Hbup to 9.1.Was 6.3 on admit. S/p 4 U prbcs.Tsat low 10%, got 520m Feraheme on 11/6, holding further doses due to infection. Increased Aranesp 60 to 100 q week on 11/17. Per pharmacist, pt received aranesp Wednesday and Friday last week. Will change aranesp dosing to q Friday and follow hemoglobin closely. 3. 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 L hip fracture - admission diagnosis. sp ORIF on 10/28 per ortho.S/p I&D surgical wound 11/18. On cefepime per ID 5. HTN/ vol - BP's are stable on her 3 home meds. Some volume on exam and mild hyponatremia, continue increased UF goals with HD 6. 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 PTH 44.  8. [facility].Reports she will be going to CIR here first. 9. Nutrition: albumin very low, on protein supplement.   SAnice Paganini PA-C 01/23/2020, 8:46 AM  [facility] Pager: [phone]

## 2020-01-23 NOTE — Progress Notes (Signed)
Round on patient today. Patient reports that she is tolerating HD well and is feeling better. Assessed and educated the patient to feel for the thrill and auscultate for bruit with disposable stethoscope of L arm AVF. Steri strips remain in place under dry dressing, site cleaned, no signs of infection, and new dressing placed. Patient to transfer to inpatient rehab this afternoon. No further questions at this time.  Mady Gemma, RN Dialysis Nurse Coordinator [phone]

## 2020-01-23 NOTE — Progress Notes (Signed)
Inpatient Rehabilitation Medication Review by a Pharmacist  A complete drug regimen review was completed for this patient to identify any potential clinically significant medication issues.  Clinically significant medication issues were identified:  yes   Type of Medication Issue Identified Description of Issue Urgent (address now) Non-Urgent (address on AM team rounds) Plan Plan Accepted by Provider? (Yes / No / Pending AM Rounds)  Drug Interaction(s) (clinically significant)       Duplicate Therapy        Allergy       No Medication Administration End Date  Metronidazole 500 mg PO q8h begun this am. Cefepime 1gm IV q24h (after HD on HD days) to continue.  Info only ID has indicated plan to continue Cefepime and Metronidazole thru 03/01/20.  Stop dates added.  n/a  Incorrect Dose  Dr. Aline August note indicates plan to decrease Carvedilol from 25 mg to 12.5 mg BID. Phone call Decrease carvedilol to 12.5 mg BID Yes   Additional Drug Therapy Needed       Other  Home supply of alosetron (Lotronex) 0.5 mg BID is in Pharmacy for daily dispensing.  Home dose was 1 mg BID, but reasonable to resume at lower dose. Info only Use home supply.  #25 tablets provided. Will need to obtain additional supply if on Rehab longer than 12 days. n/a     Name of provider notified for urgent issues identified:   Reesa Chew, PA-C  Provider Method of Notification:   Phone call (P. Love answered the phone when I called the floor to speak with RN)  Pharmacist comments:   -  Discharge summary indicated plan for Oxycodone 5-10 mg q4h prn moderate or severe pain, but most recent regimen of 10-15 mg q4h prn severe pain continued.  Has been using 15 mg 3-4 times a day.  Tramadol q6h prn was added for moderate pain  - Was using home supply of Dexilant 60 mg daily until supply ran out. Last dose taken 01/15/20. Protonix 40 mg PO daily ordered on transfer to Rehab, [facility] formulary substitution.   Time spent  performing this drug regimen review (minutes):  30 minutes   Arty Baumgartner, [place] 01/23/2020 5:55 PM

## 2020-01-23 NOTE — Progress Notes (Signed)
Inpatient Rehabilitation Admissions Coordinator  I have insurance approval and Cir bed today. I have confirmed with Dr. Wynelle Cleveland and Dr. Lyla Glassing. I met with patient at bedside and she is in agreement. I have notified hemodialysis and she is scheduled for Sunday, Tuesday and Friday dialysis due to the holiday. I will make the arrangements to admit today.  Danne Baxter, RN, MSN Rehab Admissions Coordinator [phone] 01/23/2020 10:54 AM

## 2020-01-24 ENCOUNTER — Inpatient Hospital Stay (HOSPITAL_COMMUNITY): Payer: Medicaid Other

## 2020-01-24 ENCOUNTER — Inpatient Hospital Stay (HOSPITAL_COMMUNITY): Payer: Medicaid Other | Admitting: Occupational Therapy

## 2020-01-24 DIAGNOSIS — N186 End stage renal disease: Secondary | ICD-10-CM

## 2020-01-24 DIAGNOSIS — Z992 Dependence on renal dialysis: Secondary | ICD-10-CM

## 2020-01-24 DIAGNOSIS — S88112A Complete traumatic amputation at level between knee and ankle, left lower leg, initial encounter: Secondary | ICD-10-CM | POA: Diagnosis not present

## 2020-01-24 LAB — CBC
HCT: 32 % — ABNORMAL LOW (ref 36.0–46.0)
Hemoglobin: 10.1 g/dL — ABNORMAL LOW (ref 12.0–15.0)
MCH: 28.9 pg (ref 26.0–34.0)
MCHC: 31.6 g/dL (ref 30.0–36.0)
MCV: 91.4 fL (ref 80.0–100.0)
Platelets: 341 10*3/uL (ref 150–400)
RBC: 3.5 MIL/uL — ABNORMAL LOW (ref 3.87–5.11)
RDW: 15.6 % — ABNORMAL HIGH (ref 11.5–15.5)
WBC: 12.6 10*3/uL — ABNORMAL HIGH (ref 4.0–10.5)
nRBC: 0 % (ref 0.0–0.2)

## 2020-01-24 LAB — RENAL FUNCTION PANEL
Albumin: 1.8 g/dL — ABNORMAL LOW (ref 3.5–5.0)
Anion gap: 13 (ref 5–15)
BUN: 31 mg/dL — ABNORMAL HIGH (ref 6–20)
CO2: 23 mmol/L (ref 22–32)
Calcium: 8.8 mg/dL — ABNORMAL LOW (ref 8.9–10.3)
Chloride: 94 mmol/L — ABNORMAL LOW (ref 98–111)
Creatinine, Ser: 5.58 mg/dL — ABNORMAL HIGH (ref 0.44–1.00)
GFR, Estimated: 9 mL/min — ABNORMAL LOW (ref >60)
Glucose, Bld: 52 mg/dL — ABNORMAL LOW (ref 70–99)
Phosphorus: 3.7 mg/dL (ref 2.5–4.6)
Potassium: 3.9 mmol/L (ref 3.5–5.1)
Sodium: 130 mmol/L — ABNORMAL LOW (ref 135–145)

## 2020-01-24 LAB — GLUCOSE, CAPILLARY
Glucose-Capillary: 98 mg/dL (ref 70–99)
Glucose-Capillary: 54 mg/dL — ABNORMAL LOW (ref 70–99)
Glucose-Capillary: 84 mg/dL (ref 70–99)
Glucose-Capillary: 112 mg/dL — ABNORMAL HIGH (ref 70–99)
Glucose-Capillary: 141 mg/dL — ABNORMAL HIGH (ref 70–99)

## 2020-01-24 MED ORDER — GLUCOSE 40 % PO GEL: 2.0000 | ORAL | Status: AC

## 2020-01-24 MED ORDER — ALUMINUM HYDROXIDE GEL 320 MG/5ML PO SUSP
10.0000 mL | Freq: Four times a day (QID) | ORAL | Status: DC | PRN
  Filled 2020-01-24: qty 30

## 2020-01-24 MED ORDER — HEPARIN SODIUM (PORCINE) 1000 UNIT/ML IJ SOLN
INTRAMUSCULAR | Status: AC
  Administered 2020-01-24: 1000 [IU]
  Filled 2020-01-24: qty 4

## 2020-01-24 NOTE — Evaluation (Signed)
Physical Therapy Assessment and Plan  Patient Details  Name: [name] MRN: [id number] Date of Birth: [birth year]  PT Diagnosis: Difficulty walking, Muscle weakness and Pain in L hip Rehab Potential: Good ELOS: 3-4 weeks   Today's Date: 01/24/2020 PT Individual Time: 0905-1000 PT Individual Time Calculation (min): 55 min    Hospital Problem: Principal Problem:   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HCC) Active Problems:   ESRD on dialysis [facility])   Past Medical History:  Past Medical History:  Diagnosis Date  . Anxiety 2002  . Chest tightness   . Depression 2001  . Diabetes type 1, uncontrolled ([place])    at age 22  . Diabetic neuropathy ([place])   . Essential hypertension 2015  . GERD (gastroesophageal reflux disease)    about age of 15  . Nausea and vomiting in adult   . Stroke ([place])   . Urinary frequency   . Yeast vaginitis    Past Surgical History:  Past Surgical History:  Procedure Laterality Date  . ACHILLES TENDON SURGERY Left 03/10/2013   Procedure: LEFT CHOPART AMPUTATION/ LEFT TENDON ACHILLES [place];  Surgeon: Wylene Simmer, MD;  Location: [place];  Service: Orthopedics;  Laterality: Left;  . AMPUTATION Left 06/15/2012   Procedure: AMPUTATION DIGIT;  Surgeon: Meredith Pel, MD;  Location: [place];  Service: Orthopedics;  Laterality: Left;  Left great toe revision amputation  . AMPUTATION Left 01/12/2020   Procedure: AMPUTATION BELOW KNEE;  Surgeon: Wylene Simmer, MD;  Location: [place];  Service: Orthopedics;  Laterality: Left;  . AV FISTULA PLACEMENT Left 01/09/2020   Procedure: LEFT UPPER ARM ARTERIOVENOUS (AV) FISTULA CREATION;  Surgeon: Cherre Robins, MD;  Location: [place];  Service: Vascular;  Laterality: Left;  . ENDOMETRIAL ABLATION    . I & D EXTREMITY Left 01/19/2020   Procedure: IRRIGATION AND DEBRIDEMENT Left Hip;  Surgeon: Rod Can, MD;  Location: [place];  Service: Orthopedics;  Laterality: Left;  . INSERTION OF DIALYSIS CATHETER  Right 01/09/2020   Procedure: INSERTION OF DIALYSIS CATHETER;  Surgeon: Cherre Robins, MD;  Location: [place];  Service: Vascular;  Laterality: Right;  . INTRAMEDULLARY (IM) NAIL INTERTROCHANTERIC Left 12/29/2019   Procedure: INTRAMEDULLARY (IM) NAIL INTERTROCHANTRIC;  Surgeon: Rod Can, MD;  Location: [facility];  Service: Orthopedics;  Laterality: Left;  . IR ANGIOGRAM EXTREMITY LEFT  12/17/2018  . IR FEM POP ART ATHERECT INC PTA MOD SED  12/17/2018  . IR RADIOLOGIST EVAL & MGMT  11/30/2018  . IR RADIOLOGIST EVAL & MGMT  12/09/2018  . IR RADIOLOGIST EVAL & MGMT  02/08/2019  . IR US GUIDE VASC ACCESS RIGHT  12/17/2018  . 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 She underwent IM nailing of Left hip by Dr. Doran Durand with Sq lovenox for DVT prophylaxis. Post op had worsening of renal status with fluid overload, metabolic acidosis, lethargy concerning for uremia as well as progressive leucocytosis but declined MRI of left foot initially. MRI was done 11/7 and showed 3.3 cm X 2.9 cm abscess with extensive osteomyelitis and numerous small bone abscesses. She was started on IV ancef.  Due to acute on chronic renal failure tunneled catheter was placed by Dr. Stanford Breed  on 11/08 and HD initiated.   Dr. Doran Durand consulted for input and recommended L-BKA for definitive treatment. Patient was agreeable to undergo L-BKA on 11/11 and is NWB LLE with stump sock/stump protector. She was found to develop purulent drainage from left hip incision on 11/18 and underwent I& D of hip abscess with debridement of skin, SQ tissue and muscle with drain placement by Dr. Lyla Glassing. Dr. Linus Salmons consulted for input on antibiotic coverage as wound culture  positive for enterobacter cloacae--->now on Cefepime and flagyl for 4 weeks. pain control improving--not taking IV dilaudid since yesterday. Penrose drain removed from left hip today. Therapy ongoing and patient limited by weakness, anxiety and pain affecting ADLs and mobility.    CIR recommended due to functional decline.   Patient transferred to CIR on 01/23/2020 .   Patient currently requires total with mobility secondary to muscle weakness, muscle joint tightness and pain.  Prior to hospitalization, patient was modified independent  with mobility and lived with Daughter in a Mobile home (daughter moving in this weekend) home.  Home access is 5 in frontStairs to enter.  Patient will benefit from skilled PT intervention to maximize safe functional mobility, minimize fall risk and decrease caregiver burden for planned discharge home with intermittent assist.  Anticipate patient will benefit from follow up [facility] at discharge.  PT - End of Session Activity Tolerance: Decreased this session;Tolerates 10 - 20 min activity with multiple rests Endurance Deficit: Yes Endurance Deficit Description: limited by pain/fatigue PT Assessment Rehab Potential (ACUTE/IP ONLY): Good PT Barriers to Discharge: Decreased caregiver support;Home environment access/layout;Hemodialysis PT Barriers to Discharge Comments: daughter works, needs ramp for home entry PT Patient demonstrates impairments in the following area(s): Warehouse manager;Endurance;Pain;Safety;Motor PT Transfers Functional Problem(s): Bed Mobility;Bed to Chair;Car PT Locomotion Functional Problem(s): Ambulation;Wheelchair Mobility PT Plan PT Intensity: Minimum of 1-2 x/day ,45 to 90 minutes PT Frequency: 5 out of 7 days PT Duration Estimated Length of Stay: 3-4 weeks PT Treatment/Interventions: Ambulation/gait training;Balance/vestibular training;DME/adaptive equipment instruction;Neuromuscular re-education;UE/LE Strength taining/ROM;Wheelchair  propulsion/positioning;Therapeutic Activities;Therapeutic Exercise;Patient/family education;Functional mobility training;Disease management/prevention;Pain management;Skin care/wound management PT Transfers Anticipated Outcome(s): mod I slide board, min A sit to stand PT Locomotion Anticipated Outcome(s): mod I w/c, min A short distance ambulation PT Recommendation Recommendations for Other Services: Therapeutic Recreation consult Therapeutic Recreation Interventions: Stress management Follow Up Recommendations: Home health PT Patient destination: Home Equipment Recommended: Sliding board;Wheelchair (measurements);Wheelchair cushion (measurements);Rolling walker with 5" wheels Equipment Details: TBA   PT Evaluation Precautions/Restrictions Precautions Precautions: Fall Required Braces or Orthoses: Other Brace Other Brace: L LE limb guard Restrictions Weight Bearing Restrictions: Yes LLE Weight Bearing: Non weight bearing Pain Pain Assessment Pain Scale: 0-10 Pain Score: 10-Worst pain ever Pain Type: Surgical pain Pain Location: Hip Pain Orientation: Left Pain Descriptors / Indicators: Aching Pain Frequency: Intermittent Pain Onset: On-going Patients Stated Pain Goal: 2 Pain Intervention(s): Medication (See eMAR) Home Living/Prior Functioning Home Living Living Arrangements: Children Available Help at Discharge: Family (daughter's schedule varies, works usually days) Type of Home: Mobile home (daughter moving in this weekend) Home Access: Stairs to enter Technical brewer of Steps: 5 in front Entrance Stairs-Rails: Right Home Layout: One level  Lives With: Daughter Prior Function Level of Independence: Independent with basic ADLs;Independent with transfers;Independent with gait;Requires assistive device for independence (used a knee scooter due to sore on bottom of L foot)  Able to Take Stairs?: Yes Driving: Yes Vocation: Unemployed Comments: Using knee  walker Vision/Perception     Cognition Overall Cognitive Status: Within Functional Limits for tasks  assessed Arousal/Alertness: Awake/alert Orientation Level: Oriented X4 Sensation Sensation Light Touch: Impaired Detail Peripheral sensation comments: decreased at R toes, denies phantom sensation currently Light Touch Impaired Details: Absent RLE Coordination Gross Motor Movements are Fluid and Coordinated: Not tested Motor  Motor Motor: Other (comment) Motor - Skilled Clinical Observations: generalized weakness   Trunk/Postural Assessment  Cervical Assessment Cervical Assessment: Exceptions to [facility] (forward head) Thoracic Assessment Thoracic Assessment: Exceptions to [facility] (rounded shoulders) Lumbar Assessment Lumbar Assessment: Exceptions to [facility] (posterior tilt) Postural Control Postural Control: Within Functional Limits  Balance Balance Balance Assessed: Yes Static Sitting Balance Static Sitting - Balance Support: Feet unsupported;No upper extremity supported Static Sitting - Level of Assistance: 5: Stand by assistance Dynamic Sitting Balance Dynamic Sitting - Balance Support: Bilateral upper extremity supported;During functional activity Dynamic Sitting - Level of Assistance: 4: Min assist Sitting balance - Comments: during scooting assist for safety Static Standing Balance Static Standing - Balance Support: Bilateral upper extremity supported Static Standing - Level of Assistance: 1: +2 Total assist Static Standing - Comment/# of Minutes: stood about 5 seconds Extremity Assessment      RLE Assessment RLE Assessment: Exceptions to [facility] Active Range of Motion (AROM) Comments: AROM WFL General Strength Comments: Hip flexion 3/5 w/ pain on L hip, knee extension 4/5, ankle DF 4-/5 LLE Assessment LLE Assessment: Exceptions to [facility] Active Range of Motion (AROM) Comments: AAROM hip flexion with pain limited to about 45 degrees when supine, knee flexion AROM about 50  degrees General Strength Comments: Hip flexion 2-/5, knee extension 3+/5, flexion 2+/5  Care Tool Care Tool Bed Mobility Roll left and right activity   Roll left and right assist level: Supervision/Verbal cueing    Sit to lying activity   Sit to lying assist level: 2 Helpers    Lying to sitting edge of bed activity   Lying to sitting edge of bed assist level: Moderate Assistance - Patient 50 - 74%     Care Tool Transfers Sit to stand transfer   Sit to stand assist level: 2 Helpers    Chair/bed transfer   Chair/bed transfer assist level: Maximal Assistance - Patient 25 - 49% (slideboard)     Toilet transfer        Car transfer          Care Tool Locomotion Ambulation          Walk 10 feet activity         Walk 50 feet with 2 turns activity        Walk 150 feet activity        Walk 10 feet on uneven surfaces activity        Stairs          Walk up/down 1 step activity          Walk up/down 4 steps activity      Walk up/down 12 steps activity        Pick up small objects from floor        Wheelchair Will patient use wheelchair at discharge?: Yes Type of Wheelchair: Manual   Wheelchair assist level: Supervision/Verbal cueing Max wheelchair distance: 150  Wheel 50 feet with 2 turns activity   Assist Level: Supervision/Verbal cueing  Wheel 150 feet activity   Assist Level: Supervision/Verbal cueing    Refer to Care Plan for Long Term Goals  SHORT TERM GOAL WEEK 1 PT Short Term Goal 1 (Week 1): Patient will perform bed mobility supine to sit with min  A; sit to supine with mod A PT Short Term Goal 2 (Week 1): Patient will perform bed to chair transfers with slide board and mod A PT Short Term Goal 3 (Week 1): Patient to propel w/c with S x 100' PT Short Term Goal 4 (Week 1): patient to perform sit to stand with max A of 1  Recommendations for other services: Therapeutic Recreation  Stress management  Skilled Therapeutic Intervention Patient  in supine and agreeable to PT evaluation.  She performed in bed therex consisting of L hip abduction x 10 AAROM, L knee flexion after removal of limb guard x 10, L hip flexion AAROM x 10 and L knee extension x 10.  Performed supine to sit with HOB elevated with mod A.  Patient seated EOB with S with 1 UE support.  Attempted squat pivot to w/c, but unable to tried scooting transfer with mod to max A, but pt fearful and noted chair moving away from bed, so assisted to scoot back after education on head/hip relationship for improved efficiency and ease of scooting.  Difficulty with pain control with L LE unsupported so held onto her leg till pain improved, then placed pillows and trash can under leg.  Obtained NT to assist for sit to stand transfer to RW.  Needed +2 total A for lifting, then to lift again for R knee extension.  Tolerated about 5 seconds then lowering assist back to EOB.  Attempted bed to w/c, but pt too fatigued and painful requesting to try another day.  NT assisted for scooting back and up in bed for sit to supine with +2 A.  Obtained amputee support for w/c for pt and placed ice on L hip while at rest.  Patient left with bed alarm active and needs in reach. Mobility Bed Mobility Bed Mobility: Sit to Supine;Supine to Sit Supine to Sit: Moderate Assistance - Patient 50-74% Sit to Supine: Maximal Assistance - Patient 25-49%;2 Helpers Transfers Transfers: Sit to Stand Sit to Stand: 2 Helpers;Total Assistance - Patient < 25% Transfer (Assistive device): Rolling walker Locomotion  Gait Ambulation: No Gait Gait: No Stairs / Additional Locomotion Stairs: No Wheelchair Mobility Wheelchair Mobility: No (limited with bed to w/c transfer due to pain)   Discharge Criteria: Patient will be discharged from PT if patient refuses treatment 3 consecutive times without medical reason, if treatment goals not met, if there is a change in medical status, if patient makes no progress towards goals or  if patient is discharged from hospital.  The above assessment, treatment plan, treatment alternatives and goals were discussed and mutually agreed upon: by patient  Jamison Oka, PT 01/24/2020, 12:21 PM

## 2020-01-24 NOTE — Progress Notes (Signed)
Physical Therapy Session Note  Patient Details  Name: [name] MRN: [id number] Date of Birth: 1966/02/22  Today's Date: 01/24/2020 PT Individual Time: 8110-3159 PT Individual Time Calculation (min): 59 min   Short Term Goals: No short term goals set  Skilled Therapeutic Interventions/Progress Updates:    Patient received sitting up in bed, agreeable to PT. She reports 9/10 pain in L hip, premedicated. PT providing repositioning, distractions and rest breaks to further assist with pain management. She required MaxA to complete slideboard transfer bed > wc leading R. She was able to propel herself in wc 164ft with supervision, but very slow speed and inefficient strokes maintained. Patient completing the following therex seated: 3# dowel chest press 2x12, bicep curl 3x12, shoulder press 2x12, 3# ankle weight LAQ 2x12, marches 2x10. She does report increased L proximal thigh muscle spasms with increased activity. Requesting to not complete therex on L LE. PT educating patient on importance of beginning to mobilize L LE to promote increased blood flow for healing, strength and minimize atrophy and risk for blood clots. Patient verbalized understanding. Patient returning to room, MaxA slideboard transfer back to bed, per her request, leading R. Patient able to reposition in bed for comfort with verbal cues. Bed alarm on, call light within reach.   Therapy Documentation Precautions:  Restrictions Weight Bearing Restrictions: Yes LLE Weight Bearing: Non weight bearing    Therapy/Group: Individual Therapy  Karoline Caldwell, PT, DPT, CBIS  01/24/2020, 7:39 AM

## 2020-01-24 NOTE — Progress Notes (Signed)
[name] Progress Note   Subjective:   Now in CIR. Planned for HD today. Reports her hip is sore after therapy. No other concerns.    Objective Vitals:   01/23/20 1614 01/23/20 1938 01/24/20 0438 01/24/20 0500  BP: (!) 135/50 (!) 116/41 (!) 114/49   Pulse: 63 60 (!) 58   Resp:  18 18   Temp: 98.9 F (37.2 C) 98.7 F (37.1 C) 98.5 F (36.9 C)   TempSrc: Oral Oral Oral   SpO2: 98% 96% 98%   Weight: 88.4 kg   88.5 kg  Height: '5\' 10"'  (1.778 m)      Physical Exam General:Well developed female, alert and in NAD Heart:RRR, no murmurs, rubs or gallops Lungs:CTA bilaterally without wheezing, rhonchi or rales Abdomen:Soft, non-tender, non-distended, +BS Extremities:trace edema RLE Dialysis Access:R IJ TDC, LUE AVF + thrill/bruit   Additional Objective Labs: Basic Metabolic Panel: Recent Labs  Lab 01/18/20 0605 01/19/20 0746 01/19/20 1858 01/20/20 0952 01/22/20 1503  NA 132*  --   --  131* 130*  K 4.1  --   --  4.2 3.8  CL 98  --   --  96* 95*  CO2 23  --   --  24 24  GLUCOSE 178*  --   --  234* 135*  BUN 37*  --   --  34* 34*  CREATININE 4.17*   < > 4.00* 4.92* 5.41*  CALCIUM 8.3*  --   --  8.1* 8.4*  PHOS 4.0  --   --  3.7 3.4   < > = values in this interval not displayed.   Liver Function Tests: Recent Labs  Lab 01/18/20 0605 01/20/20 0952 01/22/20 1503  ALBUMIN 1.7* 1.7* 1.7*   CBC: Recent Labs  Lab 01/19/20 1858 01/19/20 1858 01/20/20 0034 01/20/20 0034 01/21/20 0607 01/22/20 0317 01/23/20 1030  WBC 15.5*   < > 10.7*   < > 11.9* 10.6* 11.5*  NEUTROABS  --   --  7.6   < > 7.9* 6.7 7.8*  HGB 7.9*   < > 7.3*   < > 9.1* 9.1* 9.4*  HCT 26.9*   < > 24.8*   < > 28.9* 29.0* 30.6*  MCV 93.1  --  92.5  --  90.9 91.2 92.2  PLT 283   < > 282   < > 265 255 277   < > = values in this interval not displayed.   Blood Culture    Component Value Date/Time   SDES ABSCESS LEFT HIP 01/19/2020 1424   SPECREQUEST PT ON ANCEF 01/19/2020  1424   CULT  01/19/2020 1424    RARE ENTEROBACTER CLOACAE MODERATE BACTEROIDES FRAGILIS BETA LACTAMASE POSITIVE Performed at [facility], [place] [address] [place],  [address]    REPTSTATUS 01/22/2020 FINAL 01/19/2020 1424   CBG: Recent Labs  Lab 01/23/20 0759 01/23/20 1148 01/23/20 1641 01/23/20 2101 01/24/20 0601  GLUCAP 78 134* 146* 214* 98   Medications: . ceFEPime (MAXIPIME) IV 1 g (01/23/20 1754)   . (feeding supplement) PROSource Plus  30 mL Oral BID BM  . alosetron  0.5 mg Oral BID  . amitriptyline  10 mg Oral QHS  . amLODipine  10 mg Oral Daily  . atorvastatin  40 mg Oral q1800  . carvedilol  12.5 mg Oral BID WC  . Chlorhexidine Gluconate Cloth  6 each Topical Q0600  . [START ON 01/27/2020] darbepoetin (ARANESP) injection - DIALYSIS  100 mcg Intravenous Q Fri-HD  .  DULoxetine  60 mg Oral Daily  . enoxaparin (LOVENOX) injection  30 mg Subcutaneous Q24H  . hydrALAZINE  100 mg Oral TID  . insulin aspart  0-5 Units Subcutaneous QHS  . insulin aspart  0-9 Units Subcutaneous TID WC  . insulin aspart  8 Units Subcutaneous TID WC  . insulin glargine  38 Units Subcutaneous QHS  . metroNIDAZOLE  500 mg Oral Q8H  . multivitamin  1 tablet Oral QHS  . pantoprazole  40 mg Oral Daily  . sevelamer carbonate  800 mg Oral TID WC    Assessment/Plan: 1. AoCKD 5 now ESRD- b/l creat 4.9- 5.2, eGFR 9- 11 from 8/21- 10/21. Creat 6 on admission, then mid 7's, IVF's given w/o improvementso started HD. S/p TDC and AVF on11/8, 1st HD 11/9, HD#2 11/10, #3 11/12.Continue MWF schedule but will dialyze Sunday, Tuesday, Friday this week due to holiday.No heparin. 2. Anemia ckd - Hbup to 9.4.Was 6.3 on admit. S/p 4 U prbcs.Tsat low 10%, got 566m Feraheme on 11/6, holding further doses due to infection. Increased Aranesp 60 to 100 q week on 11/17.  3. 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 L hip fracture - admission  diagnosis. sp ORIF on 10/28 per ortho.S/p I&D surgical wound 11/18. On cefepime per ID  5. HTN/ vol - BP's are stable on her 3 home meds. Some volume on exam and mild hyponatremia, continue increased UF goals with HD 6. 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 PTH 44.  8. Nutrition: albumin very low, on protein supplement.  SAnice Paganini PA-C 01/24/2020, 9:50 AM  [facility] Pager: [phone]

## 2020-01-24 NOTE — Progress Notes (Signed)
[facility] PHYSICAL MEDICINE & REHABILITATION PROGRESS NOTE   Subjective/Complaints:  Pt reports slept OK; has pain in BKA but not severe- just about the get pain meds from nurse- [place] hurts more than L hip.   LBM was yesterday.    ROS:  Pt denies SOB, abd pain, CP, N/V/C/D, and vision changes   Objective:   No results found. Recent Labs    01/22/20 0317 01/23/20 1030  WBC 10.6* 11.5*  HGB 9.1* 9.4*  HCT 29.0* 30.6*  PLT 255 277   Recent Labs    01/22/20 1503  NA 130*  K 3.8  CL 95*  CO2 24  GLUCOSE 135*  BUN 34*  CREATININE 5.41*  CALCIUM 8.4*    Intake/Output Summary (Last 24 hours) at 01/24/2020 7510 Last data filed at 01/24/2020 0700 Gross per 24 hour  Intake 505.96 ml  Output --  Net 505.96 ml     Pressure Injury 01/23/20 Buttocks Left Deep Tissue Pressure Injury - Purple or maroon localized area of discolored intact skin or blood-filled blister due to damage of underlying soft tissue from pressure and/or shear. Circular, dark, red area, surrounde (Active)  01/23/20 1609  Location: Buttocks  Location Orientation: Left  Staging: Deep Tissue Pressure Injury - Purple or maroon localized area of discolored intact skin or blood-filled blister due to damage of underlying soft tissue from pressure and/or shear.  Wound Description (Comments): Circular, dark, red area, surrounded by MASD  Present on Admission: Yes    Physical Exam: Vital Signs Blood pressure (!) 114/49, pulse (!) 58, temperature 98.5 F (36.9 C), temperature source Oral, resp. rate 18, height 5\' 10"  (1.778 m), weight 88.5 kg, SpO2 98 %.   Physical Exam General: awake, alert, sitting up slightly in bed- OT in room as well as RN, NAD HEENT: conjugate gaze Heart: bradycardic- regular rhythm Chest: CTA B/L- no W/R/R- good air movement Abdomen: Soft, NT, ND, (+)BS  Extremities: No clubbing, cyanosis, or edema. Pulses are 2+ Skin: Clean and intact without signs of breakdown L buttock-  DTI Neuro: Pt is cognitively appropriate with normal insight, memory, and awareness. Cranial nerves 2-12 are intact.  MSK: L-BKA with compressive dressing. Left hip with two small sutured incision. Prior puncture due to penrose with moderate  serosanguinous drainage on dressing. 1+ pedal edema on right. LUE with dry dressing--no edema.   Psych: pt appropriate  Assessment/Plan: 1. Functional deficits which require 3+ hours per day of interdisciplinary therapy in a comprehensive inpatient rehab setting.  Physiatrist is providing close team supervision and 24 hour management of active medical problems listed below.  Physiatrist and rehab team continue to assess barriers to discharge/monitor patient progress toward functional and medical goals  Care Tool:  Bathing              Bathing assist       Upper Body Dressing/Undressing Upper body dressing        Upper body assist      Lower Body Dressing/Undressing Lower body dressing            Lower body assist       Toileting Toileting    Toileting assist       Transfers Chair/bed transfer  Transfers assist           Locomotion Ambulation   Ambulation assist              Walk 10 feet activity   Assist  Walk 50 feet activity   Assist           Walk 150 feet activity   Assist           Walk 10 feet on uneven surface  activity   Assist           Wheelchair     Assist               Wheelchair 50 feet with 2 turns activity    Assist            Wheelchair 150 feet activity     Assist          Blood pressure (!) 114/49, pulse (!) 58, temperature 98.5 F (36.9 C), temperature source Oral, resp. rate 18, height 5\' 10"  (1.778 m), weight 88.5 kg, SpO2 98 %.  Medical Problem List and Plan: 1.  Impaired mobility and ADLs secondary to L BKA             -patient may not shower due to HD port             -ELOS/Goals: 2-3 weeks MinA 2.   Antithrombotics: -DVT/anticoagulation:  Pharmaceutical: Lovenox             -antiplatelet therapy: N/A 3. Chronic pain/Pain Management: Was on oxycodone 5 mg tid prn as well Cymbalta for neuropathy (at baseline)  11/23- pt reports meds keep pain controlled- con't regimen.  4. Mood: Team to provide ego support/encouragement.              -antipsychotic agents: N/A 5. Neuropsych: This patient is capable of making decisions on her own behalf. 6. Skin/Wound Care: Monitor wound daily   11/23- Has L buttock DTI- will con't to monitor as well as L BKA and L hip incision which has been draining 7. Fluids/Electrolytes/Nutrition: Strict I/O. 1200 cc FR. Will consult dietician to educate on renal diet.  8. Left hip ORIF/hip abscess s/p I&D 11/18: NWB LLE. Enterobacter treated with Cefepime Day #3 and metronidazole D#1 9. 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 Was on Lantus 50 units in am with 10 units novolog tid.   Monitor BS ac/hs and use SSI for elevated  BS. Currently ranging from 78 to 134.   11/23- BG 98-214- 1 BG > 150 in last 24 hours- will con't to work on insulin regimen, since DM Type I 11. 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 Anemia of chronic disease/iron deficiency: Hgb-6.3 at admission--->9.1 s/p 4 units PRBC. Feraheme X 1 (additional dose held due to infection) and Aranesp weekly. Monitor with serial checks 13. HTN: Monitor BP tid--continue Coreg bid, Norvasc and apresoline tid. BP has been soft. HR is also soft. Decrease Coreg to 12.5mg  BID.   11/23- Bps soft- and pulse ~ 55-60- will change Coreg as needed- give 24 hours 14. IBS-D: Currently on senna bid, colace and miralax daily--has been refusing/will discontinue. She is currently having diarrhea. Resume Lotrenex  today.   11/23- LBM  Yesterday- doing better 15. Malnutrition: Continue protein supplement.   16. Abdominal pain: Due to diarrhea? PPI resumed-->patient to have family bring in [place] from home.  17. Acute on chronic insomnia: Will try low dose elavil at bedtime. 18. Leukocytosis: 11.5 on 11/22, up from 10.6 11/21. Repeat tomorrow to trend.  11/23- labs pending for this AM- will  monitor trend- 19. Hyponatremia  11/23- Na 130- will monitor and see if continues to head downwards.       LOS: 1 days A FACE TO FACE EVALUATION WAS PERFORMED  [name] 01/24/2020, 9:58 AM

## 2020-01-24 NOTE — Progress Notes (Signed)
[facility] Individual Statement of Services  Patient Name:  [name]  Date:  01/24/2020  Welcome to the [facility].  Our goal is to provide you with an individualized program based on your diagnosis and situation, designed to meet your specific needs.  With this comprehensive rehabilitation program, you will be expected to participate in at least 3 hours of rehabilitation therapies Monday-Friday, with modified therapy programming on the weekends.  Your rehabilitation program will include the following services:  Physical Therapy (PT), Occupational Therapy (OT), 24 hour per day rehabilitation nursing, Neuropsychology, Care Coordinator, Rehabilitation Medicine, Nutrition Services and Pharmacy Services  Weekly team conferences will be held on Tuesday to discuss your progress.  Your Inpatient Rehabilitation Care Coordinator will talk with you frequently to get your input and to update you on team discussions.  Team conferences with you and your family in attendance may also be held.  Expected length of stay: 3-4 weeks  Overall anticipated outcome: min-bathing and dressing-mod/i wheelchair level  Depending on your progress and recovery, your program may change. Your Inpatient Rehabilitation Care Coordinator will coordinate services and will keep you informed of any changes. Your Inpatient Rehabilitation Care Coordinator's name and contact numbers are listed  below.  The following services may also be recommended but are not provided by the [facility] will be made to provide these services after discharge if needed.  Arrangements include referral to agencies that provide these services.  Your insurance has been verified to be:  medicaid Your primary doctor is:  Charity fundraiser  Pertinent information will be shared with your doctor and your insurance company.  Inpatient Rehabilitation Care Coordinator:  Ovidio Kin, [place] or Emilia Beck  Information discussed with and copy given to patient by: Elease Hashimoto, 01/24/2020, 10:34 AM

## 2020-01-24 NOTE — Progress Notes (Signed)
Hypoglycemic Event  CBG: 54  Treatment: 4 oz juice/soda  Symptoms: None  Follow-up CBG: Time:1232 CBG Result:84  Possible Reasons for Event: Unknown  Comments/MD notified: Dr. Dagoberto Ligas  Patient ate her lunch  [name]  [name]

## 2020-01-24 NOTE — Progress Notes (Signed)
Patient Details  Name: [name] MRN: [id number] Date of Birth: [birth year]  Today's Date: 01/24/2020  Hospital Problems: Principal Problem:   Below-knee amputation of left lower extremity [facility]) Active Problems:   ESRD on dialysis [facility])  Past Medical History:  Past Medical History:  Diagnosis Date  . Anxiety 2002  . Chest tightness   . Depression 2001  . Diabetes type 1, uncontrolled ([place])    at age 54  . Diabetic neuropathy ([place])   . Essential hypertension 2015  . GERD (gastroesophageal reflux disease)    about age of 84  . Nausea and vomiting in adult   . Stroke ([place])   . Urinary frequency   . Yeast vaginitis    Past Surgical History:  Past Surgical History:  Procedure Laterality Date  . ACHILLES TENDON SURGERY Left 03/10/2013   Procedure: LEFT CHOPART AMPUTATION/ LEFT TENDON ACHILLES [place];  Surgeon: Wylene Simmer, MD;  Location: [place] [facility];  Service: Orthopedics;  Laterality: Left;  . AMPUTATION Left 06/15/2012   Procedure: AMPUTATION DIGIT;  Surgeon: Meredith Pel, MD;  Location: [facility];  Service: Orthopedics;  Laterality: Left;  Left great toe revision amputation  . AMPUTATION Left 01/12/2020   Procedure: AMPUTATION BELOW KNEE;  Surgeon: Wylene Simmer, MD;  Location: [facility];  Service: Orthopedics;  Laterality: Left;  . AV FISTULA PLACEMENT Left 01/09/2020   Procedure: LEFT UPPER ARM ARTERIOVENOUS (AV) FISTULA CREATION;  Surgeon: Cherre Robins, MD;  Location: [facility];  Service: Vascular;  Laterality: Left;  . ENDOMETRIAL ABLATION    . I & D EXTREMITY Left 01/19/2020   Procedure: IRRIGATION AND DEBRIDEMENT Left Hip;  Surgeon: Rod Can, MD;  Location: [place];  Service: Orthopedics;  Laterality: Left;  . INSERTION OF DIALYSIS CATHETER Right 01/09/2020   Procedure: INSERTION OF DIALYSIS CATHETER;  Surgeon: Cherre Robins, MD;  Location: [facility];  Service: Vascular;  Laterality: Right;  . INTRAMEDULLARY (IM) NAIL INTERTROCHANTERIC Left 12/29/2019   Procedure:  INTRAMEDULLARY (IM) NAIL INTERTROCHANTRIC;  Surgeon: Rod Can, MD;  Location: [facility];  Service: Orthopedics;  Laterality: Left;  . IR ANGIOGRAM EXTREMITY LEFT  12/17/2018  . IR FEM POP ART ATHERECT INC PTA MOD SED  12/17/2018  . IR RADIOLOGIST EVAL & MGMT  11/30/2018  . IR RADIOLOGIST EVAL & MGMT  12/09/2018  . IR RADIOLOGIST EVAL & MGMT  02/08/2019  . IR US GUIDE VASC ACCESS RIGHT  12/17/2018  . LAPAROSCOPIC CHOLECYSTECTOMY  01/30/2015   [facility] day surgery    Social History:  reports that she has been smoking cigarettes. She has a 5.50 pack-year smoking history. She has never used smokeless tobacco. She reports current alcohol use. She reports that she does not use drugs.  Family / Support Systems Marital Status: Divorced Patient Roles: Parent Children: kaitlyn-daughter [phone] Other Supports: few friends Anticipated Caregiver: Daughter Ability/Limitations of Caregiver: Daughter works at [employer] and goes to school at times gone from 10-6 pm Caregiver Availability: Evenings only Family Dynamics: Close with daughter who is involved and willing to assist. She has a few friends who are supportive. 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 She does not  want to burden her daughter and wants to do for herself, like she always has. Recent Psychosocial Issues: other health issues-recent fall and infection along with new HD Psychiatric History: Takes medicaitons for depression and feels this is helpful, but needs to see neuro-psych while here for coping and support. Pt has been through a lot in a short period of time due multiple medical issues and long hospitalization Substance Abuse History: No issues  Patient / Family Perceptions, Expectations & Goals Pt/Family understanding of illness & functional limitations: Pt is able to explain her surgeries and issues, she has some medical background. She talks with MD daily and feels has a good understanding of her treatment plan going forward. Premorbid pt/family roles/activities: Mom, employee, friend, etc Anticipated changes in roles/activities/participation: resume Pt/family expectations/goals: Pt states: " I need to be able to move myself around on my own so I do not ned my daughter there all of the time."  [employer]: None Premorbid Home Care/DME Agencies: Other (Comment) (has a knee scooter after her toe amputations) Transportation available at discharge: Daughter Resource referrals recommended: Neuropsychology  Discharge Planning Living Arrangements: Children Support Systems: Children, Friends/neighbors Type of Residence: Private residence Insurance Resources: [employer] (specify county) Museum/gallery curator Resources: Family Support Financial Screen Referred: Yes Living Expenses: Rent Money Management: Patient Does the patient have any problems obtaining your medications?: No Home Management: Both she and daughter Patient/Family Preliminary Plans: Return home with her daughter who works and goes to school. is gone anywhere from 10-6 pm. She is going to be able to take her to the first two weeks of HD. Pt asking about SD application Care Coordinator Barriers to Discharge:  Decreased caregiver support, Home environment access/layout Care Coordinator Barriers to Discharge Comments: Daughter gone for 8 hours per day and may ned a ramp-ques stairs into home Care Coordinator Anticipated Follow Up Needs: HH/OP  Clinical Impression Pleasant female who is willing to work in therapies to regain her independence and get back home. She is trying to adjust to her new normal after all that has happened to her while here. Will give SSD information and work on discharge needs.  Elease Hashimoto 01/24/2020, 10:25 AM

## 2020-01-24 NOTE — Progress Notes (Signed)
Inpatient Rehabilitation  Patient information reviewed and entered into eRehab system by Aletta Edmunds M. Rosan Calbert, M.A., CCC/SLP, PPS Coordinator.  Information including medical coding, functional ability and quality indicators will be reviewed and updated through discharge.    

## 2020-01-24 NOTE — Patient Care Conference (Signed)
Inpatient RehabilitationTeam Conference and Plan of Care Update Date: 01/24/2020   Time: 11:33 AM    Patient Name: [name]      Medical Record Number: [id number]  Date of Birth: 04-10-1965 Sex: Female         Room/Bed: 4M01C/4M01C-01 Payor Info: Payor: [place] / Plan: [place] / Product Type: *No Product type* /    Admit Date/Time:  01/23/2020  3:45 PM  Primary Diagnosis:  Below-knee amputation of left lower extremity [facility])  Hospital Problems: Principal Problem:   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[place]) Active Problems:   ESRD on dialysis [facility])    Expected Discharge Date: Expected Discharge Date:  (3-4 Weeks)  Team Members Present: Physician leading conference: Dr. Courtney Heys Care Coodinator Present: Dorthula Nettles, RN, BSN, CRRN;Becky Dupree, LCSW Nurse Present: Suella Grove, RN PT Present: Magda Kiel, PT OT Present: Lillia Corporal, OT PPS Coordinator present : Ileana Ladd, Burna Mortimer, SLP     Current Status/Progress Goal Weekly Team Focus  Bowel/Bladder   continent of bowel and bladder, LBM 11/22, HD mon,wed,fri  Maintain continence  monitor I &O   Swallow/Nutrition/ Hydration             ADL's   total A LB dress, LB bathe Max A all bed level, UB ADLs Min, did not attempt stand at eval, pain inhibiting in L hip  Min-Mod for LB, Min for transfers  sitting balance, core strength, LB ADLs, lateral leans, functional transfers   Mobility   bed mobility mod A, sit to stand +2 total A to RW, SBT in acute, not yet since on CIR  mod I slide board transfer, min A sit to stand, min A short distance ambulation, mod I w/c mobility  activity tolerance, standing balance, w/c mobility, strengthening   Communication             Safety/Cognition/ Behavioral Observations            Pain   pain level of 8 on L hip, controlled with oxycodone 15mg   reach pain goal of 2 out of 10  monitor and treat pain accordingly    Skin   L BKA and L hip surgical site  wound healing  maintain dressing changes per order     Discharge Planning:      Team Discussion: Continent bowel, makes some urine. HD to be on MWF. Left hip incision, left BKA incision look good. OT reports patient has pain 8/10, is total assist for lower body dressing and max assist lower body bathing at the bed level. Goals are min-mod for transfers. PT reports patient is mod assist for bed mobility. Goals are set at Mod I for W/C mobility, min assist sit to stand, and min assist short distance ambulation. MD report IV antibiotics will continue for 4 weeks. Patient on target to meet rehab goals: yes  *See Care Plan and progress notes for long and short-term goals.   Revisions to Treatment Plan:  Not at this time.  Teaching Needs: Begin family education.  Current Barriers to Discharge: Inaccessible home environment, Decreased caregiver support, Home enviroment access/layout, IV antibiotics, Incontinence, Wound care, Lack of/limited family support, Hemodialysis, Weight bearing restrictions and Behavior  Possible Resolutions to Barriers: Timed toileting for bowels, teach wound care and dressing changes, teach weight bearing precautions, continue current medications, provide emotional support, education on hemodialysis.     Medical Summary Current Status: WBC up to 12.6- on Cefepime and  Flagyl; will check skin/ID; ESRD- on HD- taking oxy/tramadol for pain; L buttock DTI; incontinent of bowel- chronic- low urine volume  Barriers to Discharge: Wound care;Weight bearing restrictions;Weight;Hemodialysis;Home enviroment access/layout;Decreased family/caregiver support;Medical stability;IV antibiotics;Other (comments);Incontinence  Barriers to Discharge Comments: DM type I; LB max-total A; just evaluated- hurting too bad; on IV ABX x 4 weeks- transfers max A of 2; Possible Resolutions to [employer]: will adjust pain meds as required; call ID  about leukocytosis; chronic bowel incontinence-x 20 yrs-  ~ 3- 4 weeks   Continued Need for Acute Rehabilitation Level of Care: The patient requires daily medical management by a physician with specialized training in physical medicine and rehabilitation for the following reasons: Direction of a multidisciplinary physical rehabilitation program to maximize functional independence : Yes Medical management of patient stability for increased activity during participation in an intensive rehabilitation regime.: Yes Analysis of laboratory values and/or radiology reports with any subsequent need for medication adjustment and/or medical intervention. : Yes   I attest that I was present, lead the team conference, and concur with the assessment and plan of the team.   Cristi Loron 01/24/2020, 12:19 PM

## 2020-01-24 NOTE — Evaluation (Signed)
Occupational Therapy Assessment and Plan  Patient Details  Name: [name] MRN: [id number] Date of Birth: 01/03/1966  OT Diagnosis: abnormal posture, acute pain, muscle weakness (generalized) and Left BKA Rehab Potential:   ELOS: 21-25 days   Today's Date: 01/24/2020 OT Individual Time: 0730-0829 OT Individual Time Calculation (min): 59 min     Hospital Problem: Principal Problem:   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HCC) Active Problems:   ESRD on dialysis [facility])   Past Medical History:  Past Medical History:  Diagnosis Date  . Anxiety 2002  . Chest tightness   . Depression 2001  . Diabetes type 1, uncontrolled ([place])    at age 18  . Diabetic neuropathy ([place])   . Essential hypertension 2015  . GERD (gastroesophageal reflux disease)    about age of 15  . Nausea and vomiting in adult   . Stroke ([place])   . Urinary frequency   . Yeast vaginitis    Past Surgical History:  Past Surgical History:  Procedure Laterality Date  . ACHILLES TENDON SURGERY Left 03/10/2013   Procedure: LEFT CHOPART AMPUTATION/ LEFT TENDON ACHILLES [place];  Surgeon: Wylene Simmer, MD;  Location: [place];  Service: Orthopedics;  Laterality: Left;  . AMPUTATION Left 06/15/2012   Procedure: AMPUTATION DIGIT;  Surgeon: Meredith Pel, MD;  Location: [place];  Service: Orthopedics;  Laterality: Left;  Left great toe revision amputation  . AMPUTATION Left 01/12/2020   Procedure: AMPUTATION BELOW KNEE;  Surgeon: Wylene Simmer, MD;  Location: [place];  Service: Orthopedics;  Laterality: Left;  . AV FISTULA PLACEMENT Left 01/09/2020   Procedure: LEFT UPPER ARM ARTERIOVENOUS (AV) FISTULA CREATION;  Surgeon: Cherre Robins, MD;  Location: [place];  Service: Vascular;  Laterality: Left;  . ENDOMETRIAL ABLATION    . I & D EXTREMITY Left 01/19/2020   Procedure: IRRIGATION AND DEBRIDEMENT Left Hip;  Surgeon: Rod Can, MD;  Location: [place];  Service: Orthopedics;  Laterality: Left;  . INSERTION OF  DIALYSIS CATHETER Right 01/09/2020   Procedure: INSERTION OF DIALYSIS CATHETER;  Surgeon: Cherre Robins, MD;  Location: [place];  Service: Vascular;  Laterality: Right;  . INTRAMEDULLARY (IM) NAIL INTERTROCHANTERIC Left 12/29/2019   Procedure: INTRAMEDULLARY (IM) NAIL INTERTROCHANTRIC;  Surgeon: Rod Can, MD;  Location: [facility];  Service: Orthopedics;  Laterality: Left;  . IR ANGIOGRAM EXTREMITY LEFT  12/17/2018  . IR FEM POP ART ATHERECT INC PTA MOD SED  12/17/2018  . IR RADIOLOGIST EVAL & MGMT  11/30/2018  . IR RADIOLOGIST EVAL & MGMT  12/09/2018  . IR RADIOLOGIST EVAL & MGMT  02/08/2019  . IR US GUIDE VASC ACCESS RIGHT  12/17/2018  . 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 She underwent IM nailing of Left hip by Dr. Doran Durand with Sq lovenox for DVT prophylaxis. Post op had worsening of renal status with fluid overload, metabolic acidosis, lethargy concerning for uremia as well as progressive leucocytosis but declined MRI of left foot initially. MRI was done 11/7 and showed 3.3 cm X 2.9 cm abscess with extensive osteomyelitis and numerous small bone abscesses. She was started on IV ancef.  Due to acute on chronic renal failure tunneled catheter was placed  by Dr. Stanford Breed on 11/08 and HD initiated.   Dr. Doran Durand consulted for input and recommended L-BKA for definitive treatment. Patient was agreeable to undergo L-BKA on 11/11 and is NWB LLE with stump sock/stump protector. She was found to develop purulent drainage from left hip incision on 11/18 and underwent I& D of hip abscess with debridement of skin, SQ tissue and muscle with drain placement by Dr. Lyla Glassing. Dr. Linus Salmons consulted for input on antibiotic coverage as  wound culture positive for enterobacter cloacae--->now on Cefepime and flagyl for 4 weeks. pain control improving--not taking IV dilaudid since yesterday. Penrose drain removed from left hip today. Therapy ongoing and patient limited by weakness, anxiety and pain affecting ADLs and mobility.    CIR recommended due to functional decline.   Patient transferred to CIR on 01/23/2020 .    Patient currently requires max to total A with basic self-care skills secondary to muscle weakness, decreased cardiorespiratoy endurance, decreased motor planning and decreased sitting balance, decreased standing balance, decreased postural control and decreased balance strategies.  Prior to hospitalization, patient could complete BADLs and some IADLs with modified independent .  Patient will benefit from skilled intervention to decrease level of assist with basic self-care skills prior to discharge home with care partner.  Anticipate patient will require intermittent supervision and follow up home health.  OT - End of Session Endurance Deficit: Yes Endurance Deficit Description: limited by pain/fatigue   OT Evaluation Precautions/Restrictions  Precautions Precautions: Fall Precaution Comments: new L BKA Required Braces or Orthoses: Other Brace Other Brace: L LE limb guard Restrictions Weight Bearing Restrictions: Yes LLE Weight Bearing: Non weight bearing Pain Pain Assessment Pain Scale: 0-10 Pain Score: 8  Pain Type: Surgical pain Pain Location: Hip Pain Orientation: Left Pain Descriptors / Indicators: Aching Pain Frequency: Intermittent Pain Onset: On-going Patients Stated Pain Goal: 2 Pain Intervention(s): Medication (See eMAR) Home Living/Prior Functioning Home Living Family/patient expects to be discharged to:: Private residence Living Arrangements: Children Available Help at Discharge: Family (intermittant, daughter's work schedule varies) Type of Home: Mobile home Home Access: Stairs to  enter Technical brewer of Steps: 5 in front Entrance Stairs-Rails: Right Home Layout: One level Bathroom Shower/Tub: Multimedia programmer: Standard  Lives With: Daughter Prior Function Level of Independence: Independent with basic ADLs, Independent with transfers, Independent with gait, Requires assistive device for independence  Able to Take Stairs?: Yes Driving: Yes Vocation: Unemployed Comments: Using knee walker Vision Baseline Vision/History: Wears glasses Wears Glasses: Reading only Patient Visual Report: Blurring of vision;No change from baseline Vision Assessment?: No apparent visual deficits Perception  Perception: Within Functional Limits Praxis Praxis: Intact Cognition Overall Cognitive Status: Within Functional Limits for tasks assessed Arousal/Alertness: Awake/alert Sensation Sensation Light Touch: Impaired Detail Peripheral sensation comments: decreased at R toes, denies phantom sensation currently Light Touch Impaired Details: Absent RLE Coordination Fine Motor Movements are Fluid and Coordinated: Yes Motor  Motor Motor: Abnormal postural alignment and control Motor - Skilled Clinical Observations: generalized weakness and pain in L hip limiting at eval  Trunk/Postural Assessment  Cervical Assessment Cervical Assessment: Exceptions to [facility] (forward head) Thoracic Assessment Thoracic Assessment: Exceptions to [facility] (rounded shoulders) Lumbar Assessment Lumbar Assessment: Exceptions to [facility] (posterior tilt) Postural Control Postural Control: Within Functional Limits  Balance Balance Balance Assessed: Yes Static Sitting Balance Static Sitting - Balance Support: Feet unsupported;No upper extremity supported Static Sitting - Level of Assistance: 5: Stand by assistance Dynamic Sitting Balance Dynamic Sitting - Balance Support: Bilateral upper extremity supported;During functional activity  Dynamic Sitting - Level of Assistance: 4: Min  assist Sitting balance - Comments: during bathing/dressing at EOB Extremity/Trunk Assessment RUE Assessment RUE Assessment: Exceptions to [facility] General Strength Comments: generalized weakness limiting, 3- to 4-/5 overall LUE Assessment LUE Assessment: Exceptions to [facility] General Strength Comments: generalized weakness limiting, 3- to 4-/5 overall  Care Tool Care Tool Self Care Eating    Set Up    Oral Care    Oral Care Assist Level: Set up assist    Bathing   Body parts bathed by patient: Right arm;Left arm;Chest;Abdomen;Front perineal area;Right upper leg;Face Body parts bathed by helper: Buttocks;Right lower leg;Front perineal area Body parts n/a: Left lower leg Assist Level: Maximal Assistance - Patient 24 - 49%    Upper Body Dressing(including orthotics)   What is the patient wearing?: Pull over shirt   Assist Level: Minimal Assistance - Patient > 75%    Lower Body Dressing (excluding footwear)   What is the patient wearing?: Underwear/pull up;Pants Assist for lower body dressing: Total Assistance - Patient < 25%    Putting on/Taking off footwear   What is the patient wearing?: Non-skid slipper socks Assist for footwear: Dependent - Patient 0%       Care Tool Toileting Toileting activity Toileting Activity did not occur (Clothing management and hygiene only): N/A (no void or bm)       Care Tool Bed Mobility Roll left and right activity   Roll left and right assist level: Supervision/Verbal cueing    Sit to lying activity   Sit to lying assist level: Supervision/Verbal cueing    Lying to sitting edge of bed activity   Lying to sitting edge of bed assist level: Moderate Assistance - Patient 50 - 74%     Care Tool Transfers Sit to stand transfer    Total A/2 helpers    Chair/bed transfer   Chair/bed transfer assist level: Maximal Assistance - Patient 25 - 49% (slideboard)     Toilet transfer Toilet transfer activity did not occur: Safety/medical concerns  (pain limiting at eval, incontinent of bowel, did not need to urinate)       Care Tool Cognition Expression of Ideas and Wants Expression of Ideas and Wants: Without difficulty (complex and basic) - expresses complex messages without difficulty and with speech that is clear and easy to understand   Understanding Verbal and Non-Verbal Content Understanding Verbal and Non-Verbal Content: Understands (complex and basic) - clear comprehension without cues or repetitions   Memory/Recall Ability *first 3 days only Memory/Recall Ability *first 3 days only: Current season;Location of own room;Staff names and faces;That he or she is in a hospital/hospital unit    Refer to Care Plan for [place] 1 OT Short Term Goal 1 (Week 1): Pt will transfer with 1 assist in prep for toilet transfer and to decrease BOC OT Short Term Goal 2 (Week 1): Pt will perform LB dressing with Max A of 1 OT Short Term Goal 3 (Week 1): Pt will perform LB bathing with AE PRN with Mod A OT Short Term Goal 4 (Week 1): Pt will perform ADL activity for 10 mins without fatigue  Recommendations for other services: Neuropsych   Skilled Therapeutic Intervention ADL ADL Upper Body Bathing: Contact guard Lower Body Bathing: Maximal assistance Where Assessed-Lower Body Bathing: Bed level Upper Body Dressing: Minimal assistance Where Assessed-Upper Body Dressing: Edge of bed Lower Body Dressing: Dependent Where Assessed-Lower Body Dressing: Bed level Toileting: Not assessed (incontinent  of bowel, did not need to urinate)    Pt greeted at time of session supine in bed resting, agreeable to OT session. RN performing med pass. MD also present and introduced self. OT discussed role and purpose of OT as well as CIR structure, pt agreeable and pleasant. Testing and interview conducted, results above and below, please see for details.  Pt with ace wrapping on residual limb, re-wrapped leg with figure eight  pattern for optimal pressure. Donned stump protector and educated on importance of knee extension for future prosthetic use. Supine to sit Mod A at EOB, limited by pain and needing assist for LLE management. Once EOB, doffed gown and performed UB bathing with Supervision, LB bathing Max A with assist to wash past knee level on R, pt able to lateral lean to R side only to partially wash buttocks with assist for remaining amount. Bed on low setting but continued to have pain sitting EOB in hip. Initiated LB dressing donning brief up to knee level before needing to return to supine max A. Donned brief remaining distance and pants with total A at bed level rolling L and R, rolling R with Mod A and L with less assist. Pt fatigued, left in supine with HOB elevated for comfort, alarm on call bell in reach.     Discharge Criteria: Patient will be discharged from OT if patient refuses treatment 3 consecutive times without medical reason, if treatment goals not met, if there is a change in medical status, if patient makes no progress towards goals or if patient is discharged from hospital.  The above assessment, treatment plan, treatment alternatives and goals were discussed and mutually agreed upon: by patient  [name] 01/24/2020, 12:28 PM

## 2020-01-24 NOTE — Plan of Care (Signed)
  Problem: RH Balance Goal: LTG: Patient will maintain dynamic sitting balance (OT) Description: LTG:  Patient will maintain dynamic sitting balance with assistance during activities of daily living (OT) Flowsheets (Taken 01/24/2020 1725) LTG: Pt will maintain dynamic sitting balance during ADLs with: Supervision/Verbal cueing Goal: LTG Patient will maintain dynamic standing with ADLs (OT) Description: LTG:  Patient will maintain dynamic standing balance with assist during activities of daily living (OT)  Flowsheets (Taken 01/24/2020 1725) LTG: Pt will maintain dynamic standing balance during ADLs with: Minimal Assistance - Patient > 75%   Problem: Sit to Stand Goal: LTG:  Patient will perform sit to stand in prep for activites of daily living with assistance level (OT) Description: LTG:  Patient will perform sit to stand in prep for activites of daily living with assistance level (OT) Flowsheets (Taken 01/24/2020 1725) LTG: PT will perform sit to stand in prep for activites of daily living with assistance level: Minimal Assistance - Patient > 75%   Problem: RH Grooming Goal: LTG Patient will perform grooming w/assist,cues/equip (OT) Description: LTG: Patient will perform grooming with assist, with/without cues using equipment (OT) Flowsheets (Taken 01/24/2020 1725) LTG: Pt will perform grooming with assistance level of: Set up assist    Problem: RH Bathing Goal: LTG Patient will bathe all body parts with assist levels (OT) Description: LTG: Patient will bathe all body parts with assist levels (OT) Flowsheets (Taken 01/24/2020 1725) LTG: Pt will perform bathing with assistance level/cueing: Minimal Assistance - Patient > 75%   Problem: RH Dressing Goal: LTG Patient will perform upper body dressing (OT) Description: LTG Patient will perform upper body dressing with assist, with/without cues (OT). Flowsheets (Taken 01/24/2020 1725) LTG: Pt will perform upper body dressing with  assistance level of: Set up assist Goal: LTG Patient will perform lower body dressing w/assist (OT) Description: LTG: Patient will perform lower body dressing with assist, with/without cues in positioning using equipment (OT) Flowsheets (Taken 01/24/2020 1725) LTG: Pt will perform lower body dressing with assistance level of: Minimal Assistance - Patient > 75%   Problem: RH Toileting Goal: LTG Patient will perform toileting task (3/3 steps) with assistance level (OT) Description: LTG: Patient will perform toileting task (3/3 steps) with assistance level (OT)  Flowsheets (Taken 01/24/2020 1725) LTG: Pt will perform toileting task (3/3 steps) with assistance level: Minimal Assistance - Patient > 75%   Problem: RH Simple Meal Prep Goal: LTG Patient will perform simple meal prep w/assist (OT) Description: LTG: Patient will perform simple meal prep with assistance, with/without cues (OT). Flowsheets (Taken 01/24/2020 1725) LTG: Pt will perform simple meal prep with assistance level of: Independent with assistive device LTG: Pt will perform simple meal prep w/level of: Wheelchair level   Problem: RH Toilet Transfers Goal: LTG Patient will perform toilet transfers w/assist (OT) Description: LTG: Patient will perform toilet transfers with assist, with/without cues using equipment (OT) Flowsheets (Taken 01/24/2020 1725) LTG: Pt will perform toilet transfers with assistance level of: Minimal Assistance - Patient > 75%

## 2020-01-25 ENCOUNTER — Inpatient Hospital Stay (HOSPITAL_COMMUNITY): Payer: Medicaid Other

## 2020-01-25 ENCOUNTER — Inpatient Hospital Stay (HOSPITAL_COMMUNITY): Payer: Medicaid Other | Admitting: Occupational Therapy

## 2020-01-25 DIAGNOSIS — S88112A Complete traumatic amputation at level between knee and ankle, left lower leg, initial encounter: Secondary | ICD-10-CM | POA: Diagnosis not present

## 2020-01-25 LAB — GLUCOSE, CAPILLARY
Glucose-Capillary: 111 mg/dL — ABNORMAL HIGH (ref 70–99)
Glucose-Capillary: 123 mg/dL — ABNORMAL HIGH (ref 70–99)
Glucose-Capillary: 179 mg/dL — ABNORMAL HIGH (ref 70–99)
Glucose-Capillary: 190 mg/dL — ABNORMAL HIGH (ref 70–99)

## 2020-01-25 MED ORDER — SODIUM CHLORIDE 0.9 % IV SOLN
500.0000 mg | INTRAVENOUS | Status: DC
  Administered 2020-01-25 – 2020-02-19 (×25): 500 mg via INTRAVENOUS
  Filled 2020-01-25 (×28): qty 0.5

## 2020-01-25 NOTE — Progress Notes (Signed)
[place] PHYSICAL MEDICINE & REHABILITATION PROGRESS NOTE   Subjective/Complaints:  Found out pt has been refusing her Flagyl for at LEAST the last 3-4 doses- no one told me until this AM.   When asked pt why, she notes it upsets her stomach "bad" and absolutely refuses to take it, "no matter what".   Also in pain this AM, but got behind on pain regimen- usually regimen is helpful for her   ROS:  Pt denies SOB, abd pain, CP, N/V/C/D, and vision changes   Objective:   No results found. Recent Labs    01/23/20 1030 01/24/20 1015  WBC 11.5* 12.6*  HGB 9.4* 10.1*  HCT 30.6* 32.0*  PLT 277 341   Recent Labs    01/22/20 1503 01/24/20 1015  NA 130* 130*  K 3.8 3.9  CL 95* 94*  CO2 24 23  GLUCOSE 135* 52*  BUN 34* 31*  CREATININE 5.41* 5.58*  CALCIUM 8.4* 8.8*    Intake/Output Summary (Last 24 hours) at 01/25/2020 0831 Last data filed at 01/25/2020 0730 Gross per 24 hour  Intake 600 ml  Output 3500 ml  Net -2900 ml     Pressure Injury 01/23/20 Buttocks Left Deep Tissue Pressure Injury - Purple or maroon localized area of discolored intact skin or blood-filled blister due to damage of underlying soft tissue from pressure and/or shear. Circular, dark, red area, surrounde (Active)  01/23/20 1609  Location: Buttocks  Location Orientation: Left  Staging: Deep Tissue Pressure Injury - Purple or maroon localized area of discolored intact skin or blood-filled blister due to damage of underlying soft tissue from pressure and/or shear.  Wound Description (Comments): Circular, dark, red area, surrounded by MASD  Present on Admission: Yes    Physical Exam: Vital Signs Blood pressure (!) 139/57, pulse 67, temperature 97.6 F (36.4 C), temperature source Oral, resp. rate 18, height 5\' 10"  (1.778 m), weight 91.6 kg, SpO2 96 %.   Physical Exam General: awake, alert, sitting up in bed; appropriate, flat affect, nAD HEENT: conjugate gaze Heart: RRR Chest: CTA B/L- no  W/R/R- good air movement Abdomen: Soft, NT, ND, (+)BS  Extremities: No clubbing, cyanosis, or edema. Pulses are 2+ Skin: Clean and intact without signs of breakdown Assessed L BKA- had original dressing in place, it appeared Has dog ears and a lot of dried blood but sutures intact; no erythema, no active drainage seen- overall looks great Neuro: Ox3. Cranial nerves 2-12 are intact.  MSK: L-BKA with compressive dressing. Left hip with two small sutured incision. Prior puncture due to penrose with moderate  serosanguinous drainage on dressing. 1+ pedal edema on right. LUE with dry dressing--no edema.   Psych: pt appropriate but flat affect  Assessment/Plan: 1. Functional deficits which require 3+ hours per day of interdisciplinary therapy in a comprehensive inpatient rehab setting.  Physiatrist is providing close team supervision and 24 hour management of active medical problems listed below.  Physiatrist and rehab team continue to assess barriers to discharge/monitor patient progress toward functional and medical goals  Care Tool:  Bathing    Body parts bathed by patient: Right arm, Left arm, Chest, Abdomen, Front perineal area, Right upper leg, Face   Body parts bathed by helper: Buttocks, Right lower leg, Front perineal area Body parts n/a: Left lower leg   Bathing assist Assist Level: Maximal Assistance - Patient 24 - 49%     Upper Body Dressing/Undressing Upper body dressing   What is the patient wearing?: Pull over shirt  Upper body assist Assist Level: Minimal Assistance - Patient > 75%    Lower Body Dressing/Undressing Lower body dressing      What is the patient wearing?: Underwear/pull up, Pants     Lower body assist Assist for lower body dressing: Total Assistance - Patient < 25%     Toileting Toileting Toileting Activity did not occur Landscape architect and hygiene only): N/A (no void or bm)  Toileting assist Assist for toileting: Independent with  assistive device Assistive Device Comment: female urinal   Transfers Chair/bed transfer  Transfers assist  Chair/bed transfer activity did not occur: Safety/medical concerns  Chair/bed transfer assist level: Maximal Assistance - Patient 25 - 49% (slideboard)     Locomotion Ambulation   Ambulation assist   Ambulation activity did not occur: Safety/medical concerns          Walk 10 feet activity   Assist  Walk 10 feet activity did not occur: Safety/medical concerns        Walk 50 feet activity   Assist Walk 50 feet with 2 turns activity did not occur: Safety/medical concerns         Walk 150 feet activity   Assist Walk 150 feet activity did not occur: Safety/medical concerns         Walk 10 feet on uneven surface  activity   Assist Walk 10 feet on uneven surfaces activity did not occur: Safety/medical concerns         Wheelchair     Assist Will patient use wheelchair at discharge?: Yes Type of Wheelchair: Manual    Wheelchair assist level: Supervision/Verbal cueing Max wheelchair distance: 150    Wheelchair 50 feet with 2 turns activity    Assist        Assist Level: Supervision/Verbal cueing   Wheelchair 150 feet activity     Assist      Assist Level: Supervision/Verbal cueing   Blood pressure (!) 139/57, pulse 67, temperature 97.6 F (36.4 C), temperature source Oral, resp. rate 18, height 5\' 10"  (1.778 m), weight 91.6 kg, SpO2 96 %.  Medical Problem List and Plan: 1.  Impaired mobility and ADLs secondary to L BKA             -patient may not shower due to HD port             -ELOS/Goals: 2-3 weeks MinA 2.  Antithrombotics: -DVT/anticoagulation:  Pharmaceutical: Lovenox             -antiplatelet therapy: N/A 3. Chronic pain/Pain Management: Was on oxycodone 5 mg tid prn as well Cymbalta for neuropathy (at baseline)  11/23- pt reports meds keep pain controlled- con't regimen.  4. Mood: Team to provide ego  support/encouragement.              -antipsychotic agents: N/A 5. Neuropsych: This patient is capable of making decisions on her own behalf. 6. Skin/Wound Care: Monitor wound daily   11/23- Has L buttock DTI- will con't to monitor as well as L BKA and L hip incision which has been draining  11/24- L BKA looks great- will con't to monitor 7. Fluids/Electrolytes/Nutrition: Strict I/O. 1200 cc FR. Will consult dietician to educate on renal diet.  8. Left hip ORIF/hip abscess s/p I&D 11/18: NWB LLE. Enterobacter treated with Cefepime Day #3 and metronidazole D#1  11/24- pt has been refusing Flagyl- most likely reason her WBC is up to 12.6- have called ID/pharmacy to see what to change her to, since she absolutely  refuses to take Flagyl 9. 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 Was on Lantus 50 units in am with 10 units novolog tid.   Monitor BS ac/hs and use SSI for elevated  BS. Currently ranging from 78 to 134.   11/23- BG 98-214- 1 BG > 150 in last 24 hours- will con't to work on insulin regimen, since DM Type I  11/24- BGs 54-141 in last 24 hours- great control- con't regimen 11. 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 post discharge)  12. Anemia of chronic disease/iron deficiency: Hgb-6.3 at admission--->9.1 s/p 4 units PRBC. Feraheme X 1 (additional dose held due to infection) and Aranesp weekly. Monitor with serial checks 13. HTN: Monitor BP tid--continue Coreg bid, Norvasc and apresoline tid. BP has been soft. HR is also soft. Decrease Coreg to 12.5mg  BID.   11/23- Bps soft- and pulse ~ 55-60- will change Coreg as needed- give 24 hours  11/24- Pulse much better- 67 this AM- con't regimen 14. IBS-D: Currently on senna bid, colace and miralax daily--has been refusing/will discontinue. She is currently having diarrhea. Resume Lotrenex  today.   11/23- LBM  Yesterday- doing better 15.  Malnutrition: Continue protein supplement.  16. Abdominal pain: Due to diarrhea? PPI resumed-->patient to have family bring in Wann from home.  17. Acute on chronic insomnia: Will try low dose elavil at bedtime. 18. Leukocytosis: 11.5 on 11/22, up from 10.6 11/21. Repeat tomorrow to trend.  11/23- labs pending for this AM- will monitor trend-  11/24- Labs came back- WBC 12.6- due to refusing Flagyl most likely- will ask ID/pharmacy to help with other ABX choice.  19. Hyponatremia  11/23- Na 130- will monitor and see if continues to head downwards.       LOS: 2 days A FACE TO FACE EVALUATION WAS PERFORMED  [name] 01/25/2020, 8:31 AM

## 2020-01-25 NOTE — Progress Notes (Signed)
Occupational Therapy Session Note  Patient Details  Name: [name] MRN: [id number] Date of Birth: [birth year]  Today's Date: 01/25/2020 OT Individual Time: 7342-8768 OT Individual Time Calculation (min): 55 min    Short Term Goals: Week 1:  OT Short Term Goal 1 (Week 1): Pt will transfer with 1 assist in prep for toilet transfer and to decrease BOC OT Short Term Goal 2 (Week 1): Pt will perform LB dressing with Max A of 1 OT Short Term Goal 3 (Week 1): Pt will perform LB bathing with AE PRN with Mod A OT Short Term Goal 4 (Week 1): Pt will perform ADL activity for 10 mins without fatigue  Skilled Therapeutic Interventions/Progress Updates:    Pt in wheelchair to start session reporting increased left hip pain at 10/10 level.  Nursing was notified of request for pain meds and they were given.  Discussed expectations for pt currently to need a drop arm commode for toileting tasks at this time and possibly in the near future with therapist showing pt the one in her room.  Attempted to try sliding board transfer onto the drop arm however pt declined secondary to having too much pain.  She did agree however to work on [employer] tasks.  She was able to push her wheelchair to and from therapy with supervision, except for negotiating door openings,which therapist assisted with.  She completed 2 intervals of 7.5 mins each for BUE strengthening using the UE ergonometer.  The first set was completed peddling forward at 20-23 RPMs on Random Program setting, level 5 intensity.  Oxygen sats 97% at 68 BPM during resting.  The 2nd set was completed peddling reverse at level 10 with RPMs at 19-21.  Finished session with return back to the room at supervision level and pt resting in the wheelchair with call button and phone in reach and safety belt in place.  Discussed need for tub bench at home with pt for use in the walk-in or tub shower.  Therapist provided simulated demonstration as well.     Therapy Documentation Precautions:  Precautions Precautions: Fall Precaution Comments: new L BKA Required Braces or Orthoses: Other Brace Other Brace: L LE limb guard Restrictions Weight Bearing Restrictions: Yes LLE Weight Bearing: Non weight bearing  Pain: Pain Assessment Pain Scale: 0-10 Pain Score: 10-Worst pain ever Pain Type: Acute pain Pain Location: Hip Pain Orientation: Left Pain Descriptors / Indicators: Aching Pain Frequency: Constant Pain Onset: On-going Patients Stated Pain Goal: 2 Pain Intervention(s): Medication (See eMAR) ADL: See Care Tool Section for some details of mobility and selfcare  Therapy/Group: Individual Therapy  Maudy Yonan OTR/L 01/25/2020, 3:24 PM

## 2020-01-25 NOTE — Progress Notes (Signed)
Physical Therapy Session Note  Patient Details  Name: [name] MRN: [id number] Date of Birth: 1965-07-06  Today's Date: 01/25/2020 PT Individual Time:Session1: 1105-1200; Session2: 1533-1600 PT Individual Time Calculation (min): 55 min & 27 min  Short Term Goals: Week 1:  PT Short Term Goal 1 (Week 1): Patient will perform bed mobility supine to sit with min A; sit to supine with mod A PT Short Term Goal 2 (Week 1): Patient will perform bed to chair transfers with slide board and mod A PT Short Term Goal 3 (Week 1): Patient to propel w/c with S x 100' PT Short Term Goal 4 (Week 1): patient to perform sit to stand with max A of 1  Skilled Therapeutic Interventions/Progress Updates:    Session1: Patient in supine and agreeable to PT.  Performed L LE therex consisting of quad sets, SAQ, hip abduction, SLR, hip adductor sets and bridging bilateral over bolster all x 10 reps.  Patient supine to sit with HOB elevated with mod A for L LE and balance as lifting trunk.  Patient performed slide board transfer (SBT) to w/c +2 mod A after board placement.  Patient propelled w/c x 100' with S to therapy gym.  Educated on w/c set up for transfer including leg rest removal, armrest removal, positioning of chair and brakes.  Also demonstrated board placement.  Mod to max A overall for w/c set up. Patient transfer to mat up hill with +2 A after board placement.  Patient seated EOM with S once stool under residual limb for support.  Patient c/o spasms and reported she had pain meds, but not muscle relaxer.  Patient sit to stand to RW x 2 reps from elevated height with +2 A for balance, lifting, full extension on R knee and cues for hand placement/technique.  Patient stood for 1 minute with min A and bilateral UE support.  Patient transferred to w/c via slide board with +2 noted caught on board due to incorrect placement first time, then able to transfer after repositioning board with mod A overall and +2 for  safety (downhill towards L).  Patient pushed in w/c to room due to pain.  RN aware pt requesting muscle relaxer.  Patient left seated in w/c with belt alarm activated (after pt education), and call bell in reach.   Session2: Patient up in w/c.  Reports took more pain meds recently.  In w/c to perform UE therex consisting of bicep curls, chest press and overhead press with 5# bar.  R UE overhead press with 5# and R LE LAQ 5# (attempted hip flexion, but painful on L hip.)  Performed slide board transfer with mod to max A w/c to bed with mod A for w/c set up. Total A to remove limb guard, and pt sit to supine mod A.  Positioned for comfort and left with call bell in reach and bed alarm activated.    Therapy Documentation Precautions:  Precautions Precautions: Fall Precaution Comments: new L BKA Required Braces or Orthoses: Other Brace Other Brace: L LE limb guard Restrictions Weight Bearing Restrictions: Yes LLE Weight Bearing: Non weight bearing Pain: Pain Assessment Pain Scale: 0-10 Pain Score: 7  Pain Type: Acute pain Pain Location: Hip Pain Orientation: Left Pain Descriptors / Indicators: Spasm Pain Frequency: Constant Pain Onset: With Activity Patients Stated Pain Goal: 2 Pain Intervention(s): RN made aware;Rest    Therapy/Group: Individual Therapy  Reginia Naas  [place], PT 01/25/2020, 12:07 PM

## 2020-01-25 NOTE — Progress Notes (Signed)
[facility] Progress Note   Subjective:   Sleeping but awakens easily to voice. Notes reviewed, has been refusing her antibiotic due to GI upset. Denies SOB, CP, palpitations, dizziness. Reports HD is going well.   Objective Vitals:   01/24/20 1922 01/25/20 0455 01/25/20 0500 01/25/20 0836  BP: (!) 113/52 (!) 139/57  136/73  Pulse: 63 67    Resp: 18 18    Temp: 98 F (36.7 C) 97.6 F (36.4 C)    TempSrc: Oral Oral    SpO2: 98% 96%    Weight:   91.6 kg   Height:       Physical Exam General:Well developed female, alert and in NAD Heart:RRR, no murmurs, rubs or gallops Lungs:CTA bilaterally without wheezing, rhonchi or rales Abdomen:Soft, non-tender, non-distended, +BS Extremities: no edema b/l lower extremities Dialysis Access:R IJ TDC, LUE AVF + thrill/bruit  Additional Objective Labs: Basic Metabolic Panel: Recent Labs  Lab 01/20/20 0952 01/22/20 1503 01/24/20 1015  NA 131* 130* 130*  K 4.2 3.8 3.9  CL 96* 95* 94*  CO2 '24 24 23  ' GLUCOSE 234* 135* 52*  BUN 34* 34* 31*  CREATININE 4.92* 5.41* 5.58*  CALCIUM 8.1* 8.4* 8.8*  PHOS 3.7 3.4 3.7   Liver Function Tests: Recent Labs  Lab 01/20/20 0952 01/22/20 1503 01/24/20 1015  ALBUMIN 1.7* 1.7* 1.8*   CBC: Recent Labs  Lab 01/20/20 0034 01/20/20 0034 01/21/20 0607 01/21/20 0607 01/22/20 0317 01/23/20 1030 01/24/20 1015  WBC 10.7*   < > 11.9*   < > 10.6* 11.5* 12.6*  NEUTROABS 7.6   < > 7.9*  --  6.7 7.8*  --   HGB 7.3*   < > 9.1*   < > 9.1* 9.4* 10.1*  HCT 24.8*   < > 28.9*   < > 29.0* 30.6* 32.0*  MCV 92.5  --  90.9  --  91.2 92.2 91.4  PLT 282   < > 265   < > 255 277 341   < > = values in this interval not displayed.   Blood Culture    Component Value Date/Time   SDES ABSCESS LEFT HIP 01/19/2020 1424   SPECREQUEST PT ON ANCEF 01/19/2020 1424   CULT  01/19/2020 1424    RARE ENTEROBACTER CLOACAE MODERATE BACTEROIDES FRAGILIS BETA LACTAMASE POSITIVE Performed at [facility], [place] [address], [place], [place] [address]    REPTSTATUS 01/22/2020 FINAL 01/19/2020 1424   CBG: Recent Labs  Lab 01/24/20 1159 01/24/20 1232 01/24/20 1811 01/24/20 2102 01/25/20 0557  GLUCAP 54* 84 112* 141* 111*   Medications: . ertapenem     . (feeding supplement) PROSource Plus  30 mL Oral BID BM  . alosetron  0.5 mg Oral BID  . amitriptyline  10 mg Oral QHS  . amLODipine  10 mg Oral Daily  . atorvastatin  40 mg Oral q1800  . carvedilol  12.5 mg Oral BID WC  . Chlorhexidine Gluconate Cloth  6 each Topical Q0600  . [START ON 01/27/2020] darbepoetin (ARANESP) injection - DIALYSIS  100 mcg Intravenous Q Fri-HD  . dextrose  2 Tube Oral STAT  . DULoxetine  60 mg Oral Daily  . enoxaparin (LOVENOX) injection  30 mg Subcutaneous Q24H  . hydrALAZINE  100 mg Oral TID  . insulin aspart  0-5 Units Subcutaneous QHS  . insulin aspart  0-9 Units Subcutaneous TID WC  . insulin aspart  8 Units Subcutaneous TID WC  . insulin glargine  38 Units  Subcutaneous QHS  . multivitamin  1 tablet Oral QHS  . pantoprazole  40 mg Oral Daily  . sevelamer carbonate  800 mg Oral TID WC    Dialysis Orders:  Assessment/Plan: 1. AoCKD 5 now ESRD- b/l creat 4.9- 5.2, eGFR 9- 11 from 8/21- 10/21. Creat 6 on admission, then mid 7's, IVF's given w/o improvementso started HD. S/p [facility] and AVF on11/8, 1st HD 11/9. Continue MWF schedule but will dialyze Sunday, Tuesday, Friday this week due to holiday.No heparin. 2. Anemia ckd - Hbup to 10.1. Was 6.3 on admit. S/p 4 U prbcs.Tsat low 10%, got 560m Feraheme on 11/6, holding further doses due to infection. IncreasedAranesp 60 to 100 q week on 11/17.  3. 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 L hip fracture - admission diagnosis. sp ORIF on 10/28 per ortho.S/p I&D surgical wound 11/18.Has been refusing flagyl and WBC count back up. Per PMR 5. HTN/ vol - BP's are stable, carvedilol was decreased  then stopped due to bradycardia. Still with mild hyponatremia, increasing UF goals with HD.  6. 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 PTH 44.  8. Nutrition: albumin very low, on protein supplement.  SAnice Paganini PA-C 01/25/2020, 10:12 AM  [facility] Pager: [phone]

## 2020-01-25 NOTE — Progress Notes (Signed)
Patient ID: [name], female   DOB: 1965-04-06, 54 y.o.   MRN: [id number]  Have given pt information on applying for SSD and trasnportation program in [place]. Pt will follow up with both of these resources.

## 2020-01-26 DIAGNOSIS — S88112A Complete traumatic amputation at level between knee and ankle, left lower leg, initial encounter: Secondary | ICD-10-CM | POA: Diagnosis not present

## 2020-01-26 LAB — GLUCOSE, CAPILLARY
Glucose-Capillary: 152 mg/dL — ABNORMAL HIGH (ref 70–99)
Glucose-Capillary: 82 mg/dL (ref 70–99)
Glucose-Capillary: 162 mg/dL — ABNORMAL HIGH (ref 70–99)
Glucose-Capillary: 152 mg/dL — ABNORMAL HIGH (ref 70–99)

## 2020-01-26 MED ORDER — CHLORHEXIDINE GLUCONATE CLOTH 2 % EX PADS
6.0000 | MEDICATED_PAD | Freq: Every day | CUTANEOUS | Status: DC
  Administered 2020-01-27 – 2020-01-31 (×5): 6 via TOPICAL

## 2020-01-26 MED ORDER — PENTAFLUOROPROP-TETRAFLUOROETH EX AERO: 1.0000 "application " | INHALATION_SPRAY | CUTANEOUS | Status: DC | PRN

## 2020-01-26 MED ORDER — HEPARIN SODIUM (PORCINE) 1000 UNIT/ML DIALYSIS
1000.0000 [IU] | INTRAMUSCULAR | Status: DC | PRN
  Filled 2020-01-26: qty 1

## 2020-01-26 MED ORDER — LIDOCAINE HCL (PF) 1 % IJ SOLN: 5.0000 mL | INTRAMUSCULAR | Status: DC | PRN

## 2020-01-26 MED ORDER — SODIUM CHLORIDE 0.9 % IV SOLN: 100.0000 mL | INTRAVENOUS | Status: DC | PRN

## 2020-01-26 MED ORDER — LIDOCAINE-PRILOCAINE 2.5-2.5 % EX CREA
1.0000 "application " | TOPICAL_CREAM | CUTANEOUS | Status: DC | PRN
  Filled 2020-01-26: qty 5

## 2020-01-26 NOTE — IPOC Note (Signed)
Overall Plan of Care [facility]) Patient Details Name: [name] MRN: [id number] DOB: Apr 19, 1965  Admitting Diagnosis: Below-knee amputation of left lower extremity [facility])  Hospital Problems: Principal Problem:   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[place]) Active Problems:   ESRD on dialysis [facility])     Functional Problem List: Nursing Medication Management, Endurance, Pain, Skin Integrity, Edema, Safety  PT Balance, Skin Integrity, Endurance, Pain, Safety, Motor  OT Balance, Endurance, Motor, Pain, Safety, Sensory, Skin Integrity  SLP    TR         Basic ADL's: OT Grooming, Bathing, Dressing, Toileting     Advanced  ADL's: OT Simple Meal Preparation     Transfers: PT Bed Mobility, Bed to Chair, Car  OT Toilet     Locomotion: PT Ambulation, Wheelchair Mobility     Additional Impairments: OT None  SLP        TR      Anticipated Outcomes Item Anticipated Outcome  Self Feeding no goal  Swallowing      Basic self-care  Min A  Toileting  Min A   Bathroom Transfers Min A  Bowel/Bladder     Transfers  mod I slide board, min A sit to stand  Locomotion  mod I w/c, min A short distance ambulation  Communication     Cognition     Pain  pain at or below level 4  Safety/Judgment  Maintain safety with cues/reminders   Therapy Plan: PT Intensity: Minimum of 1-2 x/day ,45 to 90 minutes PT Frequency: 5 out of 7 days PT Duration Estimated Length of Stay: 3-4 weeks OT Intensity: Minimum of 1-2 x/day, 45 to 90 minutes OT Frequency: 5 out of 7 days OT Duration/Estimated Length of Stay: 21-25 days     Due to the current state of emergency, patients may not be receiving their 3-hours of Medicare-mandated therapy.   Team Interventions: Nursing Interventions Patient/Family Education, Disease Management/Prevention, Pain Management, Medication Management, Skin Care/Wound Management, Discharge Planning  PT interventions Ambulation/gait training,  Balance/vestibular training, DME/adaptive equipment instruction, Neuromuscular re-education, UE/LE Strength taining/ROM, Wheelchair propulsion/positioning, Therapeutic Activities, Therapeutic Exercise, Patient/family education, Functional mobility training, Disease management/prevention, Pain management, Skin care/wound management  OT Interventions Balance/vestibular training, Discharge planning, Pain management, Self Care/advanced ADL retraining, Therapeutic Activities, UE/LE Coordination activities, Visual/perceptual remediation/compensation, Therapeutic Exercise, Skin care/wound managment, Patient/family education, Functional mobility training, Disease mangement/prevention, Community reintegration, Engineer, drilling, Neuromuscular re-education, Psychosocial support, Splinting/orthotics, UE/LE Strength taining/ROM, Wheelchair propulsion/positioning  SLP Interventions    TR Interventions    SW/CM Interventions Discharge Planning, Psychosocial Support, Patient/Family Education   Barriers to Discharge MD  Medical stability, Home enviroment access/loayout, Wound care, Lack of/limited family support, Hemodialysis, Weight bearing restrictions and type I DM- fragil diabetic- on IV ABX  Nursing      PT Decreased caregiver support, Home environment access/layout, Hemodialysis daughter works, needs ramp for home entry  OT IV antibiotics, Lack of/limited family support    SLP      SW Decreased caregiver support, Home environment access/layout Daughter gone for 8 hours per day and may ned a ramp-ques stairs into home   Team Discharge Planning: Destination: PT-Home ,OT- Home , SLP-  Projected Follow-up: PT-Home health PT, OT-  Home health OT, SLP-  Projected Equipment Needs: PT-Sliding board, Wheelchair (measurements), Wheelchair cushion (measurements), Rolling walker with 5" wheels, OT- To be determined, SLP-  Equipment Details: PT-TBA, OT-  Patient/family involved in discharge  planning: PT- Patient,  OT-Patient, SLP-   MD ELOS: 3-4 weeks  Medical Rehab Prognosis:  Good Assessment: Pt is a 54 yr old female with hx of DMI (not II), L hip surgery now s/p L hip abscess/sp I&D on Ertepenem IV x 4 weeks- as well as new L BKA due to infection, and acute on chronic CKD- with new HD, and leukocytosis- likely from refusing Flagyl- changed to Ertepenem.   Goals- mod I to min Assist by d/c.     See Team Conference Notes for weekly updates to the plan of care

## 2020-01-26 NOTE — Progress Notes (Signed)
[facility] Progress Note   Subjective:   Seen in room.  Questions about renal diet and fluid restriction.    Objective Vitals:   01/25/20 1341 01/25/20 1945 01/26/20 0422 01/26/20 0446  BP: (!) 112/50 (!) 121/46  (!) 140/50  Pulse: 62 65  63  Resp: _0 Temp: 98.4 F (36.9 C) 99.3 F (37.4 C)  98.5 F (36.9 C)  TempSrc:      SpO2: 99% 97%  98%  Weight:   93 kg 90.6 kg  Height:       Physical Exam General:Well developed female, alert and in NAD Heart:RRR, no murmurs, rubs or gallops Lungs:CTA bilaterally without wheezing, rhonchi or rales Abdomen:Soft, non-tender, non-distended, +BS Extremities: no edema b/l lower extremities Dialysis Access:R IJ TDC, LUE AVF + thrill/bruit  Additional Objective Labs: Basic Metabolic Panel: Recent Labs  Lab 01/20/20 0952 01/22/20 1503 01/24/20 1015  NA 131* 130* 130*  K 4.2 3.8 3.9  CL 96* 95* 94*  CO2 _1 GLUCOSE 234* 135* 52*  BUN 34* 34* 31*  CREATININE 4.92* 5.41* 5.58*  CALCIUM 8.1* 8.4* 8.8*  PHOS 3.7 3.4 3.7   Liver Function Tests: Recent Labs  Lab 01/20/20 0952 01/22/20 1503 01/24/20 1015  ALBUMIN 1.7* 1.7* 1.8*   CBC: Recent Labs  Lab 01/20/20 0034 01/20/20 0034 01/21/20 0607 01/21/20 0607 01/22/20 0317 01/23/20 1030 01/24/20 1015  WBC 10.7*   < > 11.9*   < > 10.6* 11.5* 12.6*  NEUTROABS 7.6   < > 7.9*  --  6.7 7.8*  --   HGB 7.3*   < > 9.1*   < > 9.1* 9.4* 10.1*  HCT 24.8*   < > 28.9*   < > 29.0* 30.6* 32.0*  MCV 92.5  --  90.9  --  91.2 92.2 91.4  PLT 282   < > 265   < > 255 277 341   < > = values in this interval not displayed.   Blood Culture    Component Value Date/Time   SDES ABSCESS LEFT HIP 01/19/2020 1424   SPECREQUEST PT ON ANCEF 01/19/2020 1424   CULT  01/19/2020 1424    RARE ENTEROBACTER CLOACAE MODERATE BACTEROIDES FRAGILIS BETA LACTAMASE POSITIVE Performed at [facility], [place] [address], [place], [place] [address]    REPTSTATUS 01/22/2020  FINAL 01/19/2020 1424   CBG: Recent Labs  Lab 01/25/20 0557 01/25/20 1201 01/25/20 1719 01/25/20 2059 01/26/20 0602  GLUCAP 111* 123* 179* 190* 152*   Medications: . ertapenem Stopped (01/25/20 2000)   . (feeding supplement) PROSource Plus  30 mL Oral BID BM  . alosetron  0.5 mg Oral BID  . amitriptyline  10 mg Oral QHS  . amLODipine  10 mg Oral Daily  . atorvastatin  40 mg Oral q1800  . carvedilol  12.5 mg Oral BID WC  . Chlorhexidine Gluconate Cloth  6 each Topical Q0600  . [START ON 01/27/2020] darbepoetin (ARANESP) injection - DIALYSIS  100 mcg Intravenous Q Fri-HD  . DULoxetine  60 mg Oral Daily  . enoxaparin (LOVENOX) injection  30 mg Subcutaneous Q24H  . hydrALAZINE  100 mg Oral TID  . insulin aspart  0-5 Units Subcutaneous QHS  . insulin aspart  0-9 Units Subcutaneous TID WC  . insulin aspart  8 Units Subcutaneous TID WC  . insulin glargine  38 Units Subcutaneous QHS  . multivitamin  1 tablet Oral QHS  . pantoprazole  40 mg Oral  Daily  . sevelamer carbonate  800 mg Oral TID WC    Dialysis Orders:  Assessment/Plan: 1. AoCKD 5 now ESRD- b/l creat 4.9- 5.2, eGFR 9- 11 from 8/21- 10/21. Creat 6 on admission, then mid 7's, IVF's given w/o improvementso started HD. S/p [facility] and AVF on11/8, 1st HD 11/9. Continue MWF schedule but will dialyze Sunday, Tuesday, Friday this week due to holiday.No heparin. 2. Anemia ckd - Hbup to 10.1. Was 6.3 on admit. S/p 4 U prbcs.Tsat low 10%, got 513m Feraheme on 11/6, holding further doses due to infection. IncreasedAranesp 60 to 100 q week on 11/17.  3. 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 L hip fracture - admission diagnosis. sp ORIF on 10/28 per ortho.S/p I&D surgical wound 11/18.Has been refusing flagyl and WBC count back up. Per PMR 5. HTN/ vol - BP's are stable, carvedilol was decreased then stopped due to bradycardia. Still with mild hyponatremia, increasing UF goals  with HD.  6. 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 PTH 44.  8. Nutrition: albumin very low, on protein supplement.  EMadelon LipsMD 01/26/2020, 11:20 AM  [facility] Pager: [phone]

## 2020-01-26 NOTE — Progress Notes (Signed)
[facility]ICINE [facility] REHABILITATION PROGRESS NOTE   Subjective/Complaints:  Pt reports her throat/roof of palate is dry/sore-  Also went over, she's been changed to Ertepenem from Flagyl and Cefipime.   Still having some drainage L hip.    ROS:  Pt denies SOB, abd pain, CP, N/V/C/D, and vision changes    Objective:   No results found. Recent Labs    01/23/20 1030 01/24/20 1015  WBC 11.5* 12.6*  HGB 9.4* 10.1*  HCT 30.6* 32.0*  PLT 277 341   Recent Labs    01/24/20 1015  NA 130*  K 3.9  CL 94*  CO2 23  GLUCOSE 52*  BUN 31*  CREATININE 5.58*  CALCIUM 8.8*    Intake/Output Summary (Last 24 hours) at 01/26/2020 1607 Last data filed at 01/26/2020 0753 Gross per 24 hour  Intake 887 ml  Output --  Net 887 ml     Pressure Injury 01/23/20 Buttocks Left Deep Tissue Pressure Injury - Purple or maroon localized area of discolored intact skin or blood-filled blister due to damage of underlying soft tissue from pressure and/or shear. Circular, dark, red area, surrounde (Active)  01/23/20 1609  Location: Buttocks  Location Orientation: Left  Staging: Deep Tissue Pressure Injury - Purple or maroon localized area of discolored intact skin or blood-filled blister due to damage of underlying soft tissue from pressure and/or shear.  Wound Description (Comments): Circular, dark, red area, surrounded by MASD  Present on Admission: Yes    Physical Exam: Vital Signs Blood pressure (!) 140/50, pulse 63, temperature 98.5 F (36.9 C), resp. rate 16, height 5\' 10"  (1.778 m), weight 90.6 kg, SpO2 98 %.   Physical Exam General: awake, alert, sitting up in bed; appropriate, NAD HEENT: conjugate gaze; soft/hard palate a little pink/but no actual erythema, no yeast, no plaques, no scratches Heart: RRR Chest: CTA B/L- no W/R/R- good air movement Abdomen:Soft, NT, ND, (+)BS   Extremities: No clubbing, cyanosis, or edema. Pulses are 2+ Skin: Clean and intact without  signs of breakdown Assessed L BKA- had original dressing in place, it appeared Has dog ears and a lot of dried blood but sutures intact; no erythema, no active drainage seen- overall looks great Neuro: Ox3. Cranial nerves 2-12 are intact.  MSK: L-BKA with compressive dressing. Left hip with two small sutured incision. Prior puncture due to penrose with moderate  serosanguinous drainage on dressing. 1+ pedal edema on right. LUE with dry dressing--no edema.   Psych: more appropriate affect- less flat  Assessment/Plan: 1. Functional deficits which require 3+ hours per day of interdisciplinary therapy in a comprehensive inpatient rehab setting.  Physiatrist is providing close team supervision and 24 hour management of active medical problems listed below.  Physiatrist and rehab team continue to assess barriers to discharge/monitor patient progress toward functional and medical goals  Care Tool:  Bathing    Body parts bathed by patient: Right arm, Left arm, Chest, Abdomen, Front perineal area, Right upper leg, Face   Body parts bathed by helper: Buttocks, Right lower leg, Front perineal area Body parts n/a: Left lower leg   Bathing assist Assist Level: Maximal Assistance - Patient 24 - 49%     Upper Body Dressing/Undressing Upper body dressing   What is the patient wearing?: Pull over shirt    Upper body assist Assist Level: Minimal Assistance - Patient > 75%    Lower Body Dressing/Undressing Lower body dressing      What is the patient wearing?: Underwear/pull up,  Pants     Lower body assist Assist for lower body dressing: Total Assistance - Patient < 25%     Toileting Toileting Toileting Activity did not occur Landscape architect and hygiene only): N/A (no void or bm)  Toileting assist Assist for toileting: Independent with assistive device Assistive Device Comment: female urinal   Transfers Chair/bed transfer  Transfers assist  Chair/bed transfer activity did not  occur: Safety/medical concerns  Chair/bed transfer assist level: 2 Helpers     Locomotion Ambulation   Ambulation assist   Ambulation activity did not occur: Safety/medical concerns          Walk 10 feet activity   Assist  Walk 10 feet activity did not occur: Safety/medical concerns        Walk 50 feet activity   Assist Walk 50 feet with 2 turns activity did not occur: Safety/medical concerns         Walk 150 feet activity   Assist Walk 150 feet activity did not occur: Safety/medical concerns         Walk 10 feet on uneven surface  activity   Assist Walk 10 feet on uneven surfaces activity did not occur: Safety/medical concerns         Wheelchair     Assist Will patient use wheelchair at discharge?: Yes Type of Wheelchair: Manual    Wheelchair assist level: Supervision/Verbal cueing Max wheelchair distance: 74'    Wheelchair 50 feet with 2 turns activity    Assist        Assist Level: Supervision/Verbal cueing   Wheelchair 150 feet activity     Assist      Assist Level: Supervision/Verbal cueing   Blood pressure (!) 140/50, pulse 63, temperature 98.5 F (36.9 C), resp. rate 16, height 5\' 10"  (1.778 m), weight 90.6 kg, SpO2 98 %.  Medical Problem List and Plan: 1.  Impaired mobility and ADLs secondary to L BKA             -patient may not shower due to HD port             -ELOS/Goals: 2-3 weeks MinA 2.  Antithrombotics: -DVT/anticoagulation:  Pharmaceutical: Lovenox             -antiplatelet therapy: N/A 3. Chronic pain/Pain Management: Was on oxycodone 5 mg tid prn as well Cymbalta for neuropathy (at baseline)  11/23- pt reports meds keep pain controlled- con't regimen.  4. Mood: Team to provide ego support/encouragement.              -antipsychotic agents: N/A 5. Neuropsych: This patient is capable of making decisions on her own behalf. 6. Skin/Wound Care: Monitor wound daily   11/23- Has L buttock DTI- will  con't to monitor as well as L BKA and L hip incision which has been draining  11/24- L BKA looks great- will con't to monitor  11/25- L hip still draining a little- will monitor; L BKA looks great 7. Fluids/Electrolytes/Nutrition: Strict I/O. 1200 cc FR. Will consult dietician to educate on renal diet.  8. Left hip ORIF/hip abscess s/p I&D 11/18: NWB LLE. Enterobacter treated with Cefepime Day #3 and metronidazole D#1  11/24- pt has been refusing Flagyl- most likely reason her WBC is up to 12.6- have called ID/pharmacy to see what to change her to, since she absolutely refuses to take Flagyl  11/25- Has been changed to Ertepenem - from PO Flagyl and Cefepime- will recheck CBC and BMP in AM 9. 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 Was on Lantus 50 units in am with 10 units novolog tid.   Monitor BS ac/hs and use SSI for elevated  BS. Currently ranging from 78 to 134.   11/23- BG 98-214- 1 BG > 150 in last 24 hours- will con't to work on insulin regimen, since DM Type I  11/24- BGs 54-141 in last 24 hours- great control- con't regimen  11/25- BGs 111-190 in last 24 hours- a little higher, but still "OK" per pt- con't regimen 11. AoCKD to ESRD: Now on HD MWF (set up at [facility] post discharge)  12. Anemia of chronic disease/iron deficiency: Hgb-6.3 at admission--->9.1 s/p 4 units PRBC. Feraheme X 1 (additional dose held due to infection) and Aranesp weekly. Monitor with serial checks 13. HTN: Monitor BP tid--continue Coreg bid, Norvasc and apresoline tid. BP has been soft. HR is also soft. Decrease Coreg to 12.5mg  BID.   11/23- Bps soft- and pulse ~ 55-60- will change Coreg as needed- give 24 hours  11/24- Pulse much better- 67 this AM- con't regimen 14. IBS-D: Currently on senna bid, colace and miralax daily--has been refusing/will discontinue. She is currently having diarrhea. Resume Lotrenex   today.   11/23- LBM  Yesterday- doing better 15. Malnutrition: Continue protein supplement.  16. Abdominal pain: Due to diarrhea? PPI resumed-->patient to have family bring in [place] from home.  17. Acute on chronic insomnia: Will try low dose elavil at bedtime. 18. Leukocytosis: 11.5 on 11/22, up from 10.6 11/21. Repeat tomorrow to trend.  11/23- labs pending for this AM- will monitor trend-  11/24- Labs came back- WBC 12.6- due to refusing Flagyl most likely- will ask ID/pharmacy to help with other ABX choice.   11/15- changed to Ertepenem- will recheck CBC in AM 19. Hyponatremia  11/23- Na 130- will monitor and see if continues to head downwards.    11/25- labs in AM     LOS: 3 days A FACE TO FACE EVALUATION WAS PERFORMED  Cydni Reddoch 01/26/2020, 9:09 AM

## 2020-01-27 ENCOUNTER — Inpatient Hospital Stay (HOSPITAL_COMMUNITY): Payer: Medicaid Other | Admitting: Physical Therapy

## 2020-01-27 ENCOUNTER — Inpatient Hospital Stay (HOSPITAL_COMMUNITY): Payer: Medicaid Other | Admitting: Occupational Therapy

## 2020-01-27 DIAGNOSIS — G479 Sleep disorder, unspecified: Secondary | ICD-10-CM | POA: Diagnosis not present

## 2020-01-27 DIAGNOSIS — N186 End stage renal disease: Secondary | ICD-10-CM

## 2020-01-27 DIAGNOSIS — S88112A Complete traumatic amputation at level between knee and ankle, left lower leg, initial encounter: Secondary | ICD-10-CM | POA: Diagnosis not present

## 2020-01-27 DIAGNOSIS — G8918 Other acute postprocedural pain: Secondary | ICD-10-CM

## 2020-01-27 DIAGNOSIS — K582 Mixed irritable bowel syndrome: Secondary | ICD-10-CM

## 2020-01-27 DIAGNOSIS — E1065 Type 1 diabetes mellitus with hyperglycemia: Secondary | ICD-10-CM

## 2020-01-27 DIAGNOSIS — Z992 Dependence on renal dialysis: Secondary | ICD-10-CM

## 2020-01-27 LAB — CBC WITH DIFFERENTIAL/PLATELET
Abs Immature Granulocytes: 0.47 10*3/uL — ABNORMAL HIGH (ref 0.00–0.07)
Basophils Absolute: 0.1 10*3/uL (ref 0.0–0.1)
Basophils Relative: 1 %
Eosinophils Absolute: 0.4 10*3/uL (ref 0.0–0.5)
Eosinophils Relative: 3 %
HCT: 32.7 % — ABNORMAL LOW (ref 36.0–46.0)
Hemoglobin: 10 g/dL — ABNORMAL LOW (ref 12.0–15.0)
Immature Granulocytes: 4 %
Lymphocytes Relative: 16 %
Lymphs Abs: 1.7 10*3/uL (ref 0.7–4.0)
MCH: 27.9 pg (ref 26.0–34.0)
MCHC: 30.6 g/dL (ref 30.0–36.0)
MCV: 91.3 fL (ref 80.0–100.0)
Monocytes Absolute: 1.5 10*3/uL — ABNORMAL HIGH (ref 0.1–1.0)
Monocytes Relative: 14 %
Neutro Abs: 6.5 10*3/uL (ref 1.7–7.7)
Neutrophils Relative %: 62 %
Platelets: 294 10*3/uL (ref 150–400)
RBC: 3.58 MIL/uL — ABNORMAL LOW (ref 3.87–5.11)
RDW: 15.4 % (ref 11.5–15.5)
WBC: 10.6 10*3/uL — ABNORMAL HIGH (ref 4.0–10.5)
nRBC: 0 % (ref 0.0–0.2)

## 2020-01-27 LAB — GLUCOSE, CAPILLARY
Glucose-Capillary: 100 mg/dL — ABNORMAL HIGH (ref 70–99)
Glucose-Capillary: 59 mg/dL — ABNORMAL LOW (ref 70–99)
Glucose-Capillary: 119 mg/dL — ABNORMAL HIGH (ref 70–99)
Glucose-Capillary: 77 mg/dL (ref 70–99)
Glucose-Capillary: 187 mg/dL — ABNORMAL HIGH (ref 70–99)
Glucose-Capillary: 201 mg/dL — ABNORMAL HIGH (ref 70–99)

## 2020-01-27 LAB — CBC
HCT: 31.6 % — ABNORMAL LOW (ref 36.0–46.0)
Hemoglobin: 9.7 g/dL — ABNORMAL LOW (ref 12.0–15.0)
MCH: 28 pg (ref 26.0–34.0)
MCHC: 30.7 g/dL (ref 30.0–36.0)
MCV: 91.3 fL (ref 80.0–100.0)
Platelets: 311 10*3/uL (ref 150–400)
RBC: 3.46 MIL/uL — ABNORMAL LOW (ref 3.87–5.11)
RDW: 15.2 % (ref 11.5–15.5)
WBC: 11.6 10*3/uL — ABNORMAL HIGH (ref 4.0–10.5)
nRBC: 0 % (ref 0.0–0.2)

## 2020-01-27 LAB — BASIC METABOLIC PANEL
Anion gap: 13 (ref 5–15)
BUN: 46 mg/dL — ABNORMAL HIGH (ref 6–20)
CO2: 24 mmol/L (ref 22–32)
Calcium: 8.9 mg/dL (ref 8.9–10.3)
Chloride: 92 mmol/L — ABNORMAL LOW (ref 98–111)
Creatinine, Ser: 6.67 mg/dL — ABNORMAL HIGH (ref 0.44–1.00)
GFR, Estimated: 7 mL/min — ABNORMAL LOW (ref >60)
Glucose, Bld: 90 mg/dL (ref 70–99)
Potassium: 4.4 mmol/L (ref 3.5–5.1)
Sodium: 129 mmol/L — ABNORMAL LOW (ref 135–145)

## 2020-01-27 LAB — RENAL FUNCTION PANEL
Albumin: 1.9 g/dL — ABNORMAL LOW (ref 3.5–5.0)
Anion gap: 10 (ref 5–15)
BUN: 51 mg/dL — ABNORMAL HIGH (ref 6–20)
CO2: 26 mmol/L (ref 22–32)
Calcium: 8.6 mg/dL — ABNORMAL LOW (ref 8.9–10.3)
Chloride: 91 mmol/L — ABNORMAL LOW (ref 98–111)
Creatinine, Ser: 7.03 mg/dL — ABNORMAL HIGH (ref 0.44–1.00)
GFR, Estimated: 7 mL/min — ABNORMAL LOW (ref >60)
Glucose, Bld: 111 mg/dL — ABNORMAL HIGH (ref 70–99)
Phosphorus: 5 mg/dL — ABNORMAL HIGH (ref 2.5–4.6)
Potassium: 4.3 mmol/L (ref 3.5–5.1)
Sodium: 127 mmol/L — ABNORMAL LOW (ref 135–145)

## 2020-01-27 MED ORDER — MELATONIN 3 MG PO TABS
1.5000 mg | ORAL_TABLET | Freq: Every day | ORAL | Status: DC
  Administered 2020-01-27 – 2020-02-20 (×25): 1.5 mg via ORAL
  Filled 2020-01-27 (×25): qty 1

## 2020-01-27 MED ORDER — HEPARIN SODIUM (PORCINE) 1000 UNIT/ML IJ SOLN
INTRAMUSCULAR | Status: AC
  Filled 2020-01-27: qty 2

## 2020-01-27 MED ORDER — GLUCOSE 40 % PO GEL
ORAL | Status: AC
  Administered 2020-01-27: 37.5 g
  Filled 2020-01-27: qty 1

## 2020-01-27 MED ORDER — ALTEPLASE 2 MG IJ SOLR
2.0000 mg | Freq: Once | INTRAMUSCULAR | Status: DC | PRN
  Filled 2020-01-27: qty 2

## 2020-01-27 MED ORDER — INSULIN ASPART 100 UNIT/ML ~~LOC~~ SOLN: 8.0000 [IU] | Freq: Two times a day (BID) | SUBCUTANEOUS | Status: DC

## 2020-01-27 MED ORDER — INSULIN ASPART 100 UNIT/ML ~~LOC~~ SOLN
5.0000 [IU] | Freq: Every day | SUBCUTANEOUS | Status: DC
  Administered 2020-01-28: 5 [IU] via SUBCUTANEOUS

## 2020-01-27 NOTE — Progress Notes (Signed)
Patient dropped before lunch again today--will decrease AM meal coverage to 5 units--continue  Lunch/supper at 8 units.

## 2020-01-27 NOTE — Progress Notes (Signed)
Orthopedic Tech Progress Note Patient Details:  [name] [birth year] [id number] Called in order to [facility] for a BKA SHRINKER Patient ID: MICHELA HERST, female   DOB: January 05, 1966, 54 y.o.   MRN: [id number]   Janit Pagan 01/27/2020, 11:30 AM

## 2020-01-27 NOTE — Progress Notes (Signed)
[facility] Progress Note   Subjective:  Seen in room. No CP or dyspnea. Leg pain stable for now.  Objective Vitals:   01/26/20 0446 01/26/20 1344 01/26/20 1924 01/27/20 0431  BP: (!) 140/50 (!) 125/49 (!) 113/46 (!) 137/38  Pulse: 63 61 61 65  Resp: '16 17 17 18  ' Temp: 98.5 F (36.9 C) 98.7 F (37.1 C) 98.4 F (36.9 C) 98.5 F (36.9 C)  TempSrc:    Oral  SpO2: 98% 97% 96% 97%  Weight: 90.6 kg   91.9 kg  Height:       Physical Exam General: Well appearing woman, NAD Heart: RRR; no murmur Lungs: CTAB, no rales Abdomen: soft, non-tender Extremities: L BKA wrapped, 2+ RLE edema Dialysis Access: R TDC, LUE AVF + thrill  Additional Objective Labs: Basic Metabolic Panel: Recent Labs  Lab 01/20/20 0952 01/20/20 0952 01/22/20 1503 01/24/20 1015 01/27/20 0441  NA 131*   < > 130* 130* 129*  K 4.2   < > 3.8 3.9 4.4  CL 96*   < > 95* 94* 92*  CO2 24   < > '24 23 24  ' GLUCOSE 234*   < > 135* 52* 90  BUN 34*   < > 34* 31* 46*  CREATININE 4.92*   < > 5.41* 5.58* 6.67*  CALCIUM 8.1*   < > 8.4* 8.8* 8.9  PHOS 3.7  --  3.4 3.7  --    < > = values in this interval not displayed.   Liver Function Tests: Recent Labs  Lab 01/20/20 0952 01/22/20 1503 01/24/20 1015  ALBUMIN 1.7* 1.7* 1.8*   CBC: Recent Labs  Lab 01/21/20 0607 01/21/20 0607 01/22/20 0317 01/22/20 0317 01/23/20 1030 01/24/20 1015 01/27/20 0441  WBC 11.9*   < > 10.6*   < > 11.5* 12.6* 10.6*  NEUTROABS 7.9*   < > 6.7  --  7.8*  --  6.5  HGB 9.1*   < > 9.1*   < > 9.4* 10.1* 10.0*  HCT 28.9*   < > 29.0*   < > 30.6* 32.0* 32.7*  MCV 90.9  --  91.2  --  92.2 91.4 91.3  PLT 265   < > 255   < > 277 341 294   < > = values in this interval not displayed.   Blood Culture    Component Value Date/Time   SDES ABSCESS LEFT HIP 01/19/2020 1424   SPECREQUEST PT ON ANCEF 01/19/2020 1424   CULT  01/19/2020 1424    RARE ENTEROBACTER CLOACAE MODERATE BACTEROIDES FRAGILIS BETA LACTAMASE  POSITIVE Performed at [facility], [place] [address], [place], [place] [address]    REPTSTATUS 01/22/2020 FINAL 01/19/2020 1424   Medications: . sodium chloride    . sodium chloride    . ertapenem 500 mg (01/26/20 1707)   . (feeding supplement) PROSource Plus  30 mL Oral BID BM  . alosetron  0.5 mg Oral BID  . amitriptyline  10 mg Oral QHS  . amLODipine  10 mg Oral Daily  . atorvastatin  40 mg Oral q1800  . carvedilol  12.5 mg Oral BID WC  . Chlorhexidine Gluconate Cloth  6 each Topical Q0600  . darbepoetin (ARANESP) injection - DIALYSIS  100 mcg Intravenous Q Fri-HD  . DULoxetine  60 mg Oral Daily  . enoxaparin (LOVENOX) injection  30 mg Subcutaneous Q24H  . hydrALAZINE  100 mg Oral TID  . insulin aspart  0-5 Units Subcutaneous QHS  .  insulin aspart  0-9 Units Subcutaneous TID WC  . insulin aspart  8 Units Subcutaneous TID WC  . insulin glargine  38 Units Subcutaneous QHS  . multivitamin  1 tablet Oral QHS  . pantoprazole  40 mg Oral Daily  . sevelamer carbonate  800 mg Oral TID WC   Dialysis Orders: Pending.  Assessment/Plan: 1. AoCKD 5, now ESRD: Baseline Creat 4.9- 5.2, eGFR 9-11 from 8/21 - 10/21. Creat 6 on admission, then mid 7's, IVF's given w/o improvementso started HD. S/p [facility] and AVF on11/8, 1st HD 11/9. Continue MWF schedule - for HD today. 2. Anemia of ESRD - Hgb 6.3 on admit. S/p 4U PRBCs.Tsat low 10%, got 530m Feraheme on 11/6, holding further doses due to infection. IncreasedAranesp 60 to 100 q week on 11/17. Hgb 10 today. 3. L foot stump chronic open wound w/ poss osteo by plain films -> s/p L BKA 11/11 4. L hip fracture - admission diagnosis. S/p ORIF on 10/28 per ortho.S/p I&D surgical wound 11/18.Was refusing Flagyl, now looks like on Ertapenem. 5. HTN/ vol - BP's are stable, carvedilol was decreased then stopped due to bradycardia. With hyponatremia and edema today, increasing UF goals with HD.  6. DM - on insulin, per pmd 7. BMM -Corrected  calcium 10.6, not on VDRA, use low Calcium bath.Started binder this admission and  phos at goal now. PTH 44.  8. Nutrition: albumin very low, on protein supplement.  KVeneta Penton PA-C 01/27/2020, 9:08 AM  [employer]

## 2020-01-27 NOTE — Progress Notes (Signed)
Hypoglycemic Event  CBG: 59   Treatment: 1 tube glucose gel  Symptoms: None  Follow-up CBG: Time:1220 CBG Result:77  Possible Reasons for Event: Unknown  Comments/MD notified:Yes    Milus Mallick

## 2020-01-27 NOTE — Progress Notes (Signed)
Occupational Therapy Session Note  Patient Details  Name: [name] MRN: [id number] Date of Birth: 07/11/1965  Today's Date: 01/27/2020 OT Individual Time: 9628-3662 OT Individual Time Calculation (min): 60 min    Short Term Goals: Week 1:  OT Short Term Goal 1 (Week 1): Pt will transfer with 1 assist in prep for toilet transfer and to decrease BOC OT Short Term Goal 2 (Week 1): Pt will perform LB dressing with Max A of 1 OT Short Term Goal 3 (Week 1): Pt will perform LB bathing with AE PRN with Mod A OT Short Term Goal 4 (Week 1): Pt will perform ADL activity for 10 mins without fatigue   Skilled Therapeutic Interventions/Progress Updates:    Pt greeted at time of session reclined in bed resting but agreeable to OT session, c/o LLE pain but no number given and pt was premedicated prior to session. Rewrapped LLE residual limb with figure 8 pattern, educated again on importance of knee extension for future prosthetic use. Pt was not wearing a brief and given her bowel incontinence, donned brief at bed level with rolling L/R with Min A, easier rolling to her L than R. Dependently donned brief for the pt with her assisting in rolling at bed level. Performed bed level bathing for LB this date with Mod A, pt able to wash R foot by bringing up to her in figure four at bed level and wash thighs, assist for washing buttocks when rolling earlier in session. Supine to sit EOB Min with use of bed rail. Doffed shirt and performed UB bathing/dress with CGA/close supervision. Donned pants at EOB with lateral leans to don over hips but limited by pain, returned to supine Min/Mod and finished rolling L/R. Scooted up in bed with use of trapeze bar and pushing through RLE with assist to hold in place. Positioned for comfort, alarm on, call bell in reach.   Therapy Documentation Precautions:  Precautions Precautions: Fall Precaution Comments: new L BKA Required Braces or Orthoses: Other Brace Other  Brace: L LE limb guard Restrictions Weight Bearing Restrictions: Yes LLE Weight Bearing: Non weight bearing     Therapy/Group: Individual Therapy  [name] 01/27/2020, 9:24 AM

## 2020-01-27 NOTE — Progress Notes (Signed)
[place] PHYSICAL MEDICINE & REHABILITATION PROGRESS NOTE   Subjective/Complaints: Patient seen sitting up in bed this morning.  She states she did not sleep well overnight due to being in the hospital.  She is working with therapies.  She was seen by nephrology yesterday, notes reviewed-no changes  ROS: Denies CP, SOB, N/V/D  Objective:   No results found. Recent Labs    01/24/20 1015 01/27/20 0441  WBC 12.6* 10.6*  HGB 10.1* 10.0*  HCT 32.0* 32.7*  PLT 341 294   Recent Labs    01/24/20 1015 01/27/20 0441  NA 130* 129*  K 3.9 4.4  CL 94* 92*  CO2 23 24  GLUCOSE 52* 90  BUN 31* 46*  CREATININE 5.58* 6.67*  CALCIUM 8.8* 8.9    Intake/Output Summary (Last 24 hours) at 01/27/2020 1014 Last data filed at 01/27/2020 0800 Gross per 24 hour  Intake 800 ml  Output 50 ml  Net 750 ml     Pressure Injury 01/23/20 Buttocks Left Deep Tissue Pressure Injury - Purple or maroon localized area of discolored intact skin or blood-filled blister due to damage of underlying soft tissue from pressure and/or shear. Circular, dark, red area, surrounde (Active)  01/23/20 1609  Location: Buttocks  Location Orientation: Left  Staging: Deep Tissue Pressure Injury - Purple or maroon localized area of discolored intact skin or blood-filled blister due to damage of underlying soft tissue from pressure and/or shear.  Wound Description (Comments): Circular, dark, red area, surrounded by MASD  Present on Admission: Yes    Physical Exam: Vital Signs Blood pressure (!) 137/38, pulse 65, temperature 98.5 F (36.9 C), temperature source Oral, resp. rate 18, height 5\' 10"  (1.778 m), weight 91.9 kg, SpO2 97 %. Constitutional: No distress . Vital signs reviewed. HENT: Normocephalic.  Atraumatic. Eyes: EOMI. No discharge. Cardiovascular: No JVD.  RRR. Respiratory: Normal effort.  No stridor.  Bilateral clear to auscultation. GI: Non-distended.  BS +. Skin: Warm and dry.  Left BKA with sutures  CDI Psych: Normal mood.  Normal behavior. Musc: Left BKA with edema and tenderness Left foot with tenderness Neuro: Alert Motor: Right lower extremity: Hip flexion, knee extension 4-55 (pain inhibition), ankle dorsiflexion 4+/5 Left lower extremity: Hip flexion, knee extension 2 -/5 (some pain inhibition), ankle dorsiflexion 4/5   Assessment/Plan: 1. Functional deficits which require 3+ hours per day of interdisciplinary therapy in a comprehensive inpatient rehab setting.  Physiatrist is providing close team supervision and 24 hour management of active medical problems listed below.  Physiatrist and rehab team continue to assess barriers to discharge/monitor patient progress toward functional and medical goals  Care Tool:  Bathing    Body parts bathed by patient: Right arm, Left arm, Chest, Abdomen, Front perineal area, Right upper leg, Face   Body parts bathed by helper: Buttocks, Right lower leg Body parts n/a: Left lower leg   Bathing assist Assist Level: Moderate Assistance - Patient 50 - 74% (at bed level)     Upper Body Dressing/Undressing Upper body dressing   What is the patient wearing?: Pull over shirt    Upper body assist Assist Level: Contact Guard/Touching assist    Lower Body Dressing/Undressing Lower body dressing      What is the patient wearing?: Underwear/pull up, Pants     Lower body assist Assist for lower body dressing: Total Assistance - Patient < 25%     Toileting Toileting Toileting Activity did not occur (Clothing management and hygiene only): N/A (no void or bm)  Toileting assist Assist for toileting: Independent with assistive device Assistive Device Comment: female urinal   Transfers Chair/bed transfer  Transfers assist  Chair/bed transfer activity did not occur: Safety/medical concerns  Chair/bed transfer assist level: 2 Helpers     Locomotion Ambulation   Ambulation assist   Ambulation activity did not occur:  Safety/medical concerns          Walk 10 feet activity   Assist  Walk 10 feet activity did not occur: Safety/medical concerns        Walk 50 feet activity   Assist Walk 50 feet with 2 turns activity did not occur: Safety/medical concerns         Walk 150 feet activity   Assist Walk 150 feet activity did not occur: Safety/medical concerns         Walk 10 feet on uneven surface  activity   Assist Walk 10 feet on uneven surfaces activity did not occur: Safety/medical concerns         Wheelchair     Assist Will patient use wheelchair at discharge?: Yes Type of Wheelchair: Manual    Wheelchair assist level: Supervision/Verbal cueing Max wheelchair distance: 79'    Wheelchair 50 feet with 2 turns activity    Assist        Assist Level: Supervision/Verbal cueing   Wheelchair 150 feet activity     Assist      Assist Level: Supervision/Verbal cueing   Blood pressure (!) 137/38, pulse 65, temperature 98.5 F (36.9 C), temperature source Oral, resp. rate 18, height 5\' 10"  (1.778 m), weight 91.9 kg, SpO2 97 %.  Medical Problem List and Plan: 1.  Impaired mobility and ADLs secondary to L BKA  Continue CIR 2.  Antithrombotics: -DVT/anticoagulation:  Pharmaceutical: Lovenox             -antiplatelet therapy: N/A 3. Chronic pain/Pain Management: Was on oxycodone 5 mg tid prn as well Cymbalta for neuropathy (at baseline)  Relatively controlled with meds on 11/26 4. Mood: Team to provide ego support/encouragement.              -antipsychotic agents: N/A 5. Neuropsych: This patient is capable of making decisions on her own behalf. 6. Skin/Wound Care: Monitor wound daily   Stump shrinker ordered  7. Fluids/Electrolytes/Nutrition: Strict I/O. 1200 cc FR. Will consult dietician to educate on renal diet.  8. Left hip ORIF/hip abscess s/p I&D 11/18: NWB LLE.   Flagyl changed to Ertepenem due to patient refusal  Continue cefepime 9. 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 T1DM with neuropathy: Hgb A1c-6.2. Was on Lantus 50 units in am with 10 units novolog tid.     Monitor BS ac/hs and use SSI for elevated  BS.   Slightly labile on 11/26, monitor for trend 11. Now ESRD: Now on HD MWF (set up at [facility] post discharge)  12. Anemia of chronic disease/iron deficiency: Hgb-6.3 at admission--->9.1 s/p 4 units PRBC. Feraheme X 1 (additional dose held due to infection) and Aranesp weekly.   Hemoglobin 10.0 on 11/26, continue to monitor 13. HTN: Monitor BP tid--continue Coreg bid, Norvasc and apresoline tid.   Decreased Coreg to 12.5mg  BID.   Slightly labile on 11/26 14. IBS-D: Currently on senna bid, colace and miralax daily--has been refusing, DC'd   Resumed Lotrenex    Improving 15. Malnutrition: Continue protein supplement.  16. Abdominal pain: Due to diarrhea? PPI resumed-->patient to have family bring in  dexilant from home.  17.  Sleep disturbance:   Low dose elavil at bedtime.  Melatonin added on 11/26 18. Leukocytosis:   WBCs 10.6 on 11/26  See #8, #9  Afebrile  LOS: 4 days A FACE TO FACE EVALUATION WAS PERFORMED  [name] Lorie Phenix 01/27/2020, 10:14 AM

## 2020-01-27 NOTE — Progress Notes (Signed)
Physical Therapy Session Note  Patient Details  Name: [name] MRN: [id number] Date of Birth: 04/26/1965  Today's Date: 01/27/2020 PT Individual [id number]  - 1000 and 1055-1153 75 mins and 58 mins     Short Term Goals: Week 1:  PT Short Term Goal 1 (Week 1): Patient will perform bed mobility supine to sit with min A; sit to supine with mod A PT Short Term Goal 2 (Week 1): Patient will perform bed to chair transfers with slide board and mod A PT Short Term Goal 3 (Week 1): Patient to propel w/c with S x 100' PT Short Term Goal 4 (Week 1): patient to perform sit to stand with max A of 1  Skilled Therapeutic Interventions/Progress Updates:    session 1: pt received in bed and agreeable to therapy. MD and nursing present for Lt residual limb assessment. Pt reported 8/10 pain in Lt residual limb and denied to participate in transfer out of bed for first session and requested to transfer to [facility] during second session. Pt directed in supine bed exercises, 2x10 on RLE leg lifts, ankle pumps, hip abduction and adduction, quad sets; LLE quad sets, SAQ, hip abduction and adduction, leg lifts. Pt required extra time with all exercises 2/2 high pain levels and required multiple rest breaks post each set for recovery. Pt directed in rolling to Rt and Lt and use of BUE and RLE for upward scooting for improved positioning, mod A to complete. Pt denied to transfer to EOB 2/2 pain and fatigue post OT session. Pt required ace wrapping on Lt residual limb total A to complete for compression. Pt left in supine, All needs in reach and in good condition. Call light in hand, alarm set.   Session 2: pt received in bed and agreeable to therapy. Pt directed in supine>sit min A with support of LLE to EOB, min A for positioning at EOB and scooting to EOB. Once sitting in good position, CGA for dynamic sitting balance. Pt directed in seated BUE strengthening ex with 3# bicep curls, tricep extension, chest press, overhead  press 2x10 with rest breaks post each set. Pt reported 8/10 pain in L hip and at residual limb. Nursing brought to bedside for pain medication. Pt directed in dynamic sitting balance activities with reaching in various directions forward at [place] 2x15 completed, no LOB noted but pt reported increased pain in residual limb, 9/10. Pt directed in static sitting balance EOB with deep breathing technique to attempt to relief pain levels. Pt reported having slight dizziness EOB and had tolerated sitting EOB for 35 mins. Pt directed in returning to supine, mod A. BP taken in supine to be 144/63. Pt reported feeling better in supine with dizziness however pain increased to 10/10 in residual limb per pt. Initially pt agreeable to attempt transfer training however with increased pain and dizziness pt reported she did not want to attempt further mobility. Pt reported being very fatigued at end of session. Left supine, alarm set, All needs in reach and in good condition. Call light in hand.    Therapy Documentation Precautions:  Precautions Precautions: Fall Precaution Comments: new L BKA Required Braces or Orthoses: Other Brace Other Brace: L LE limb guard Restrictions Weight Bearing Restrictions: Yes LLE Weight Bearing: Non weight bearing General:   Vital Signs:  Pain: Pain Assessment Pain Scale: 0-10 Pain Score: 8  Pain Type: Acute pain Pain Location: Leg Pain Orientation: Left Pain Descriptors / Indicators: Aching;Sore;Shooting Pain Frequency: Intermittent Pain Onset: On-going  Patients Stated Pain Goal: 1 Pain Intervention(s): Medication (See eMAR) Mobility:   Locomotion :    Trunk/Postural Assessment :    Balance:   Exercises:   Other Treatments:      Therapy/Group: Individual Therapy  Junie Panning 01/27/2020, 8:57 AM

## 2020-01-28 ENCOUNTER — Inpatient Hospital Stay (HOSPITAL_COMMUNITY): Payer: Medicaid Other

## 2020-01-28 ENCOUNTER — Inpatient Hospital Stay (HOSPITAL_COMMUNITY): Payer: Medicaid Other | Admitting: Occupational Therapy

## 2020-01-28 DIAGNOSIS — G8918 Other acute postprocedural pain: Secondary | ICD-10-CM | POA: Diagnosis not present

## 2020-01-28 DIAGNOSIS — S88112A Complete traumatic amputation at level between knee and ankle, left lower leg, initial encounter: Secondary | ICD-10-CM | POA: Diagnosis not present

## 2020-01-28 DIAGNOSIS — E1065 Type 1 diabetes mellitus with hyperglycemia: Secondary | ICD-10-CM | POA: Diagnosis not present

## 2020-01-28 DIAGNOSIS — R7881 Bacteremia: Secondary | ICD-10-CM

## 2020-01-28 DIAGNOSIS — D72829 Elevated white blood cell count, unspecified: Secondary | ICD-10-CM

## 2020-01-28 DIAGNOSIS — N186 End stage renal disease: Secondary | ICD-10-CM | POA: Diagnosis not present

## 2020-01-28 DIAGNOSIS — I10 Essential (primary) hypertension: Secondary | ICD-10-CM

## 2020-01-28 LAB — GLUCOSE, CAPILLARY
Glucose-Capillary: 77 mg/dL (ref 70–99)
Glucose-Capillary: 38 mg/dL — CL (ref 70–99)
Glucose-Capillary: 57 mg/dL — ABNORMAL LOW (ref 70–99)
Glucose-Capillary: 73 mg/dL (ref 70–99)
Glucose-Capillary: 160 mg/dL — ABNORMAL HIGH (ref 70–99)
Glucose-Capillary: 190 mg/dL — ABNORMAL HIGH (ref 70–99)

## 2020-01-28 MED ORDER — INSULIN GLARGINE 100 UNIT/ML ~~LOC~~ SOLN
10.0000 [IU] | Freq: Every day | SUBCUTANEOUS | Status: DC
  Administered 2020-01-28: 10 [IU] via SUBCUTANEOUS
  Filled 2020-01-28 (×3): qty 0.1

## 2020-01-28 MED ORDER — DARBEPOETIN ALFA 100 MCG/0.5ML IJ SOSY
100.0000 ug | PREFILLED_SYRINGE | INTRAMUSCULAR | Status: DC
  Administered 2020-02-06 – 2020-02-20 (×3): 100 ug via INTRAVENOUS
  Filled 2020-01-28 (×4): qty 0.5

## 2020-01-28 MED ORDER — INSULIN ASPART 100 UNIT/ML ~~LOC~~ SOLN: 5.0000 [IU] | Freq: Three times a day (TID) | SUBCUTANEOUS | Status: DC

## 2020-01-28 MED ORDER — GLUCOSE 40 % PO GEL
2.0000 | ORAL | Status: AC
  Administered 2020-01-28: 75 g via ORAL

## 2020-01-28 MED ORDER — GLUCOSE 40 % PO GEL
ORAL | Status: AC
  Filled 2020-01-28: qty 1

## 2020-01-28 NOTE — Progress Notes (Signed)
Hypoglycemic Event  CBG: 38  Treatment: 8 oz juice/soda  Symptoms: None  Follow-up CBG: Time: 1034 CBG Result:57  Possible Reasons for Event: Unknown  Comments/MD notified: Dr. Ova Freshwater

## 2020-01-28 NOTE — Progress Notes (Signed)
Hypoglycemic Event  CBG: 57  Treatment: 2 tubes glucose gel  Symptoms: None  Follow-up CBG: Time:73 CBG Result:73  Possible Reasons for Event: Unknown  Comments/MD notified:Dr. Ova Freshwater

## 2020-01-28 NOTE — Progress Notes (Signed)
[facility] Progress Note   Subjective:  Seen during PT session - going well. Still with leg pain, no dyspnea. HD went fine yesterday - 3L removed without issues.  Objective Vitals:   01/27/20 1958 01/28/20 0449 01/28/20 0449 01/28/20 0802  BP: (!) 136/48  (!) 137/56 (!) 128/52  Pulse: 68  61   Resp: 18  16   Temp: 98.9 F (37.2 C)  98.4 F (36.9 C)   TempSrc: Oral  Oral   SpO2: 94%  96%   Weight:  88.8 kg    Height:       Physical Exam General: Well appearing woman, NAD Heart: RRR; no murmur Lungs: CTAB, no rales Abdomen: soft, non-tender Extremities: L BKA in large brace, 2+ RLE edema Dialysis Access: R TDC, LUE AVF + thrill  Additional Objective Labs: Basic Metabolic Panel: Recent Labs  Lab 01/22/20 1503 01/22/20 1503 01/24/20 1015 01/27/20 0441 01/27/20 1444  NA 130*   < > 130* 129* 127*  K 3.8   < > 3.9 4.4 4.3  CL 95*   < > 94* 92* 91*  CO2 24   < > _0 GLUCOSE 135*   < > 52* 90 111*  BUN 34*   < > 31* 46* 51*  CREATININE 5.41*   < > 5.58* 6.67* 7.03*  CALCIUM 8.4*   < > 8.8* 8.9 8.6*  PHOS 3.4  --  3.7  --  5.0*   < > = values in this interval not displayed.   Liver Function Tests: Recent Labs  Lab 01/22/20 1503 01/24/20 1015 01/27/20 1444  ALBUMIN 1.7* 1.8* 1.9*   CBC: Recent Labs  Lab 01/22/20 0317 01/22/20 0317 01/23/20 1030 01/23/20 1030 01/24/20 1015 01/27/20 0441 01/27/20 1421  WBC 10.6*   < > 11.5*   < > 12.6* 10.6* 11.6*  NEUTROABS 6.7  --  7.8*  --   --  6.5  --   HGB 9.1*   < > 9.4*   < > 10.1* 10.0* 9.7*  HCT 29.0*   < > 30.6*   < > 32.0* 32.7* 31.6*  MCV 91.2  --  92.2  --  91.4 91.3 91.3  PLT 255   < > 277   < > 341 294 311   < > = values in this interval not displayed.   Blood Culture    Component Value Date/Time   SDES ABSCESS LEFT HIP 01/19/2020 1424   SPECREQUEST PT ON ANCEF 01/19/2020 1424   CULT  01/19/2020 1424    RARE ENTEROBACTER CLOACAE MODERATE BACTEROIDES FRAGILIS BETA LACTAMASE  POSITIVE Performed at [facility], [place] [address], [place], [place] [address]    REPTSTATUS 01/22/2020 FINAL 01/19/2020 1424   Medications: . sodium chloride    . sodium chloride    . ertapenem 500 mg (01/26/20 1707)   . (feeding supplement) PROSource Plus  30 mL Oral BID BM  . alosetron  0.5 mg Oral BID  . amitriptyline  10 mg Oral QHS  . amLODipine  10 mg Oral Daily  . atorvastatin  40 mg Oral q1800  . carvedilol  12.5 mg Oral BID WC  . Chlorhexidine Gluconate Cloth  6 each Topical Q0600  . darbepoetin (ARANESP) injection - DIALYSIS  100 mcg Intravenous Q Fri-HD  . DULoxetine  60 mg Oral Daily  . enoxaparin (LOVENOX) injection  30 mg Subcutaneous Q24H  . hydrALAZINE  100 mg Oral TID  . insulin aspart  0-5  Units Subcutaneous QHS  . insulin aspart  0-9 Units Subcutaneous TID WC  . insulin glargine  10 Units Subcutaneous QHS  . melatonin  1.5 mg Oral QHS  . multivitamin  1 tablet Oral QHS  . pantoprazole  40 mg Oral Daily  . sevelamer carbonate  800 mg Oral TID WC    Dialysis Orders: Pending.  Assessment/Plan: 1. AoCKD 5, now ESRD: Baseline Creat 4.9- 5.2, eGFR 9-11 from 8/21 - 10/21. Creat 6 on admission, then mid 7's, IVF's given w/o improvementso started HD. S/p [facility] and AVF on11/8, 1st HD 11/9. Continue MWF schedule - next HD 11/29. 2. Anemia of ESRD - Hgb 6.3 on admit. S/p 4U PRBCs.Tsat low 10%, got 561m Feraheme on 11/6, holding further doses due to infection. IncreasedAranesp 60 to 100 q week on 11/17. Last Hgb 9.7. 3. L foot stump chronic open wound w/ poss osteo by plain films -> s/p L BKA 11/11 4. L hip fracture - admission diagnosis. S/p ORIF on 10/28 per ortho.S/p I&D surgical wound 11/18.Was refusing Flagyl, now looks like on Ertapenem. 5. HTN/ vol - BP's are stable, carvedilol was decreased then stopped due to bradycardia. With hyponatremia and edema today, increasing UF goals with HD.  6. 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 PTH 44.  8. Nutrition: albumin very low, on protein supplement.  KVeneta Penton PA-C 01/28/2020, 12:04 PM  [facility]

## 2020-01-28 NOTE — Progress Notes (Signed)
Occupational Therapy Session Note  Patient Details  Name: [name] MRN: [id number] Date of Birth: 06-03-1965  Today's Date: 01/28/2020 OT Individual Time: 4580-9983 and 1300-1400 OT Individual Time Calculation (min): 57 min and 60 min   Short Term Goals: Week 1:  OT Short Term Goal 1 (Week 1): Pt will transfer with 1 assist in prep for toilet transfer and to decrease BOC OT Short Term Goal 2 (Week 1): Pt will perform LB dressing with Max A of 1 OT Short Term Goal 3 (Week 1): Pt will perform LB bathing with AE PRN with Mod A OT Short Term Goal 4 (Week 1): Pt will perform ADL activity for 10 mins without fatigue  Skilled Therapeutic Interventions/Progress Updates:    1) Treatment session with focus on functional mobility, BUE strengthening, and endurance.  Pt received semi-reclined in bed agreeable to therapy session.  Pt declined bathing/dressing this session, requesting to focus on BUE strength and endurance.  Pt completed bed mobility with increased time and min assist.  Therapist educated on placement of slide board while positioning it in preparation for transfer.  Pt able to complete transfer with CGA bed > w/c after setup of board placement.  Pt propelled to ortho gym for BUE strengthening.  Engaged in [place] on [place] with pt completing 5 mins forward on resistance level 4.0.  Pt reports feeling like her blood sugar was dropping, therefore notified RN who checked and it was 38.  Pt ate crackers and peanut butter and drank regular ginger ale, reporting feeling better.  Pt wanting to complete BUE strengthening, therefore completed 5 mins backwards on UBE resistance level 4.0 and then returned to room.  Pt agreeable to remaining upright in w/c until next session.  2) Treatment session with focus on functional transfers, dynamic sitting balance, BUE strengthening, and endurance.  Pt received upright in w/c reporting has been up since AM session.  Pt agreeable to therapy session,  reports blood sugars have been good since AM session.  Pt propelled w/c to therapy gym with supervision, min cues for straight trajectory.  Engaged in slide board transfers with setup for slide board positioning, followed by pt completed min assist fading to [place] transfers.  Engaged in [place] and endurance with use of zoom ball while seated on edge of mat.  Pt able to complete zoom ball while alternating hand positions to increase challenge.  Engaged in 2 sets of 10 chest presses, overhead presses, and lateral rows with 3# dowel rod.  Pt required frequent rest breaks between activities due to pain and "spasms" in Lt hip.  Pt returned to room and completed transfer back to bed min assist with increased time due to slight incline transferring back to bed.  Min assist to assist lifting LLE in to bed to return to supine.  Pt remained semi-reclined in bed with all needs in reach.  Therapy Documentation Precautions:  Precautions Precautions: Fall Precaution Comments: new L BKA Required Braces or Orthoses: Other Brace Other Brace: L LE limb guard Restrictions Weight Bearing Restrictions: Yes LLE Weight Bearing: Non weight bearing General:   Vital Signs: Therapy Vitals Temp: 98 F (36.7 C) Temp Source: Oral Pulse Rate: 63 Resp: 17 BP: 133/69 Patient Position (if appropriate): Lying Oxygen Therapy SpO2: 100 % O2 Device: Room Air Pain: Pain Assessment Pain Scale: 0-10 Pain Score: 9 Pain Type: Acute pain Pain Location: Hip Pain Orientation: Left Pain Descriptors / Indicators: Aching Pain Frequency: Intermittent Pain Onset: On-going Patients Stated Pain  Goal: 2 Pain Intervention(s): Premedicated  2)Pt reports pain 9/10 premedicated.   Therapy/Group: Individual Therapy  Simonne Come 01/28/2020, 2:50 PM

## 2020-01-28 NOTE — Progress Notes (Addendum)
Physical Therapy Session Note  Patient Details  Name: [name] MRN: [id number] Date of Birth: 1965/09/01  Today's Date: 01/28/2020 PT Individual Time: 5366-4403 and 4742-5956 PT Individual Time Calculation (min): 30 min , 57 min  Short Term Goals: Week 1:  PT Short Term Goal 1 (Week 1): Patient will perform bed mobility supine to sit with min A; sit to supine with mod A PT Short Term Goal 2 (Week 1): Patient will perform bed to chair transfers with slide board and mod A PT Short Term Goal 3 (Week 1): Patient to propel w/c with S x 100' PT Short Term Goal 4 (Week 1): patient to perform sit to stand with max A of 1  Skilled Therapeutic Interventions/Progress Updates:  tx 1:  Pt resting in bed.  She stated that she had pain L hip, slight.  Therapeutic exercises performed with trunk and LEs to increase strength for functional mobility. In supine- 10 x 2 L quad sets, R straight leg raises; 15 x 1 R ankle pumps, bil adductor squeezes against pillow;  5 x 2 R unilateral bridging with PT raising LLE PRN. Partial L side lying- 15 x 1 R hip abduction with flexed knee and hip.  At end of session, pt resting in bed with needs at hand and bed alarm set.  tx 2:  Pt sitting in w/c, with limb protector donned.  She rated pain L hip 7/10, premedicated this AM.  W/c propulsion x 150' with supervision and cues for steering.  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  PT wrapped R w/c rim with Theraband for increased friction and better steering by pt, x 150' with supervision. Brake lock levers wrapped with [place] for identifying them by touch.  Slide board transfer to R/L with min assist after board was placed.  Demo and VCS for head/hips relationship.  Transfer training in sitting, using yoga blocks for pressing up with bil hands while flexing trunk forward to un-weight hips, x 5 x 2.  L hip pain limited pt.  Therapeutic exercises performed with LEs and RUE to  increase strength for functional mobility; seated with limb protector doffed: L short arc quad knee extensions;  Seated R UE find motor activity requiring trunk flexion to reach 25 objects and manipulate them, slowly  Discussed pain mgt with pt and encouraged her to become pro-active about requesting pain meds appropriately before therapy sessions start.  Pt donned limb protector with mod assist.  At end of session, pt seated in w/c with needs at hand.  Discussed fall risks; pt verbalized that she would use call bell to ask for help PRN and would not attempt getting back in bed without help.     Therapy Documentation Precautions:  Precautions Precautions: Fall Precaution Comments: new L BKA Required Braces or Orthoses: Other Brace Other Brace: L LE limb guard Restrictions Weight Bearing Restrictions: Yes LLE Weight Bearing: Non weight bearing      Therapy/Group: Individual Therapy  Irie Fiorello 01/28/2020, 9:39 AM

## 2020-01-28 NOTE — Progress Notes (Signed)
[facility] PHYSICAL MEDICINE & REHABILITATION PROGRESS NOTE   Subjective/Complaints: Patient seen laying in bed this morning.  She states she slept well overnight.  She states her shrinker feels good.  She was seen by nephrology yesterday, notes reviewed-no changes.  She was noted to have hypoglycemia yesterday.  ROS: Denies CP, SOB, N/V/D  Objective:   No results found. Recent Labs    01/27/20 0441 01/27/20 1421  WBC 10.6* 11.6*  HGB 10.0* 9.7*  HCT 32.7* 31.6*  PLT 294 311   Recent Labs    01/27/20 0441 01/27/20 1444  NA 129* 127*  K 4.4 4.3  CL 92* 91*  CO2 24 26  GLUCOSE 90 111*  BUN 46* 51*  CREATININE 6.67* 7.03*  CALCIUM 8.9 8.6*    Intake/Output Summary (Last 24 hours) at 01/28/2020 0857 Last data filed at 01/27/2020 1835 Gross per 24 hour  Intake 360 ml  Output 3000 ml  Net -2640 ml     Pressure Injury 01/23/20 Buttocks Left Deep Tissue Pressure Injury - Purple or maroon localized area of discolored intact skin or blood-filled blister due to damage of underlying soft tissue from pressure and/or shear. Circular, dark, red area, surrounde (Active)  01/23/20 1609  Location: Buttocks  Location Orientation: Left  Staging: Deep Tissue Pressure Injury - Purple or maroon localized area of discolored intact skin or blood-filled blister due to damage of underlying soft tissue from pressure and/or shear.  Wound Description (Comments): Circular, dark, red area, surrounded by MASD  Present on Admission: Yes    Physical Exam: Vital Signs Blood pressure (!) 128/52, pulse 61, temperature 98.4 F (36.9 C), temperature source Oral, resp. rate 16, height 5\' 10"  (1.778 m), weight 88.8 kg, SpO2 96 %. Constitutional: No distress . Vital signs reviewed. HENT: Normocephalic.  Atraumatic. Eyes: EOMI. No discharge. Cardiovascular: No JVD.  RRR. Respiratory: Normal effort.  No stridor.  Bilateral clear to auscultation. GI: Non-distended.  BS +. Skin: Warm and dry.  Left  BKA with shrinker CDI. Psych: Normal mood.  Normal behavior. Musc: Left BKA with edema and tenderness Right lower extremity edema Neuro: Alert Motor: Right lower extremity: Hip flexion, knee extension 4-55 (pain inhibition), ankle dorsiflexion 4+/5 Left lower extremity: Hip flexion, knee extension 3/5 (some pain inhibition), ankle dorsiflexion 4/5   Assessment/Plan: 1. Functional deficits which require 3+ hours per day of interdisciplinary therapy in a comprehensive inpatient rehab setting.  Physiatrist is providing close team supervision and 24 hour management of active medical problems listed below.  Physiatrist and rehab team continue to assess barriers to discharge/monitor patient progress toward functional and medical goals  Care Tool:  Bathing    Body parts bathed by patient: Right arm, Left arm, Chest, Abdomen, Front perineal area, Right upper leg, Face   Body parts bathed by helper: Buttocks, Right lower leg Body parts n/a: Left lower leg   Bathing assist Assist Level: Moderate Assistance - Patient 50 - 74% (at bed level)     Upper Body Dressing/Undressing Upper body dressing   What is the patient wearing?: Pull over shirt    Upper body assist Assist Level: Contact Guard/Touching assist    Lower Body Dressing/Undressing Lower body dressing      What is the patient wearing?: Underwear/pull up, Pants     Lower body assist Assist for lower body dressing: Total Assistance - Patient < 25%     Toileting Toileting Toileting Activity did not occur (Clothing management and hygiene only): N/A (no void or bm)  Toileting assist Assist for toileting: Independent with assistive device Assistive Device Comment: female urinal   Transfers Chair/bed transfer  Transfers assist  Chair/bed transfer activity did not occur: Safety/medical concerns  Chair/bed transfer assist level: 2 Helpers     Locomotion Ambulation   Ambulation assist   Ambulation activity did not  occur: Safety/medical concerns          Walk 10 feet activity   Assist  Walk 10 feet activity did not occur: Safety/medical concerns        Walk 50 feet activity   Assist Walk 50 feet with 2 turns activity did not occur: Safety/medical concerns         Walk 150 feet activity   Assist Walk 150 feet activity did not occur: Safety/medical concerns         Walk 10 feet on uneven surface  activity   Assist Walk 10 feet on uneven surfaces activity did not occur: Safety/medical concerns         Wheelchair     Assist Will patient use wheelchair at discharge?: Yes Type of Wheelchair: Manual    Wheelchair assist level: Supervision/Verbal cueing Max wheelchair distance: 70'    Wheelchair 50 feet with 2 turns activity    Assist        Assist Level: Supervision/Verbal cueing   Wheelchair 150 feet activity     Assist      Assist Level: Supervision/Verbal cueing   Medical Problem List and Plan: 1.  Impaired mobility and ADLs secondary to L BKA  Continue CIR 2.  Antithrombotics: -DVT/anticoagulation:  Pharmaceutical: Lovenox             -antiplatelet therapy: N/A 3. Chronic pain/Pain Management: Was on oxycodone 5 mg tid prn as well Cymbalta for neuropathy (at baseline)  Relatively controlled with meds on 11/27 4. Mood: Team to provide ego support/encouragement.              -antipsychotic agents: N/A 5. Neuropsych: This patient is capable of making decisions on her own behalf. 6. Skin/Wound Care: Monitor wound daily   Continue stump shrinker  7. Fluids/Electrolytes/Nutrition: Strict I/O. 1200 cc FR. Will consult dietician to educate on renal diet.  8. Left hip ORIF/hip abscess s/p I&D 11/18: NWB LLE.   Flagyl changed to Ertepenem due to patient refusal, continue  Continue cefepime 9. 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 T1DM with neuropathy: Hgb A1c-6.2. Was on Lantus 50 units in am with 10  units novolog tid.     Lantus 38 twice daily  NovoLog 5 units with breakfast, 8 units with lunch and dinner, decreased to 5 3 times daily on 11/27.  Monitor BS ac/hs and use SSI for elevated  BS.   Labile on 11/27, monitor for trend 11. Now ESRD: Now on HD MWF (set up at [facility] post discharge)  12. Anemia of chronic disease/iron deficiency: Hgb-6.3 at admission--->9.1 s/p 4 units PRBC. Feraheme X 1 (additional dose held due to infection) and Aranesp weekly.   Hemoglobin 9.7 on 11/26  Continue to monitor 13. HTN: Monitor BP tid--continue Coreg bid, Norvasc and apresoline tid.   Decreased Coreg to 12.5mg  BID.   Controlled on 11/27 14. IBS-D: Currently on senna bid, colace and miralax daily--has been refusing, DC'd   Resumed Lotrenex    Improving 15. Malnutrition: Continue protein supplement.  16. Abdominal pain: Due to diarrhea? PPI resumed-->patient to have family bring in [place] from home.  17.  Sleep disturbance:   Low dose elavil at bedtime.  Melatonin added on 11/26  Improving 18. Leukocytosis:   WBCs 11.6 on 11/26  See #8, #9  Afebrile  LOS: 5 days A FACE TO FACE EVALUATION WAS PERFORMED  Declin Rajan Lorie Phenix 01/28/2020, 8:57 AM

## 2020-01-29 ENCOUNTER — Inpatient Hospital Stay (HOSPITAL_COMMUNITY): Payer: Medicaid Other | Admitting: Occupational Therapy

## 2020-01-29 ENCOUNTER — Inpatient Hospital Stay (HOSPITAL_COMMUNITY): Payer: Medicaid Other

## 2020-01-29 DIAGNOSIS — N186 End stage renal disease: Secondary | ICD-10-CM | POA: Diagnosis not present

## 2020-01-29 DIAGNOSIS — R7881 Bacteremia: Secondary | ICD-10-CM | POA: Diagnosis not present

## 2020-01-29 DIAGNOSIS — E1065 Type 1 diabetes mellitus with hyperglycemia: Secondary | ICD-10-CM | POA: Diagnosis not present

## 2020-01-29 DIAGNOSIS — E162 Hypoglycemia, unspecified: Secondary | ICD-10-CM

## 2020-01-29 DIAGNOSIS — S88112A Complete traumatic amputation at level between knee and ankle, left lower leg, initial encounter: Secondary | ICD-10-CM | POA: Diagnosis not present

## 2020-01-29 LAB — GLUCOSE, CAPILLARY
Glucose-Capillary: 230 mg/dL — ABNORMAL HIGH (ref 70–99)
Glucose-Capillary: 158 mg/dL — ABNORMAL HIGH (ref 70–99)
Glucose-Capillary: 139 mg/dL — ABNORMAL HIGH (ref 70–99)
Glucose-Capillary: 170 mg/dL — ABNORMAL HIGH (ref 70–99)

## 2020-01-29 MED ORDER — INSULIN GLARGINE 100 UNIT/ML ~~LOC~~ SOLN
15.0000 [IU] | Freq: Every day | SUBCUTANEOUS | Status: DC
  Administered 2020-01-29 – 2020-02-19 (×22): 15 [IU] via SUBCUTANEOUS
  Filled 2020-01-29 (×23): qty 0.15

## 2020-01-29 MED ORDER — DOCUSATE SODIUM 100 MG PO CAPS
100.0000 mg | ORAL_CAPSULE | Freq: Two times a day (BID) | ORAL | Status: DC | PRN
  Administered 2020-01-29 – 2020-01-31 (×2): 100 mg via ORAL
  Filled 2020-01-29: qty 1

## 2020-01-29 NOTE — Progress Notes (Signed)
Occupational Therapy Session Note  Patient Details  Name: [name] MRN: [id number] Date of Birth: May 10, 1965  Today's Date: 01/29/2020 OT Individual Time: 7782-4235 and 3614-4315 OT Individual Time Calculation (min): 56 min and 51 min   Short Term Goals: Week 1:  OT Short Term Goal 1 (Week 1): Pt will transfer with 1 assist in prep for toilet transfer and to decrease BOC OT Short Term Goal 2 (Week 1): Pt will perform LB dressing with Max A of 1 OT Short Term Goal 3 (Week 1): Pt will perform LB bathing with AE PRN with Mod A OT Short Term Goal 4 (Week 1): Pt will perform ADL activity for 10 mins without fatigue  Skilled Therapeutic Interventions/Progress Updates:    Pt greeted in the w/c and premedicated for pain. She wanted to engage in bathing/dressing tasks during session. Setup for UB bathing at the sink with pt washing underneath shirt vs doffing shirt first. Attempted lateral leans for doffing pants with pt reporting having too much pain to continue. She also did not want to try sit<stand. Therefore she required CGA when returning to the bed once the slideboard was placed. Pt able to lower pants to knees and unfasten brief in prep for LB bathing. She needed assistance to wash the Rt LE and also buttocks, pt rolling Rt>Lt given Mod A. She did assist OT with perihygiene in the back but required A for thoroughness. Pt remained in bed at close of session with all needs within reach and bed alarm set. Tx focus placed on activity tolerance, ADL retraining, and transfer training.   2nd Session 1:1 tx (51 min) Pt greeted in bed after finishing her dressing change with RN. Pt also medicated for pain at this time. She was motivated to have her hair washed today. Bed mobility completed with setup assistance and increased time due to hip pain. CGA for slideboard>w/c after OT placed board. Once her hair was washed by OT using the hair washing tray, pt worked on [employer] and endurance by  brushing and blow-drying hair. Her affect was visibly brightened at this time. Note that we placed ice packs on her Lt thigh while up in the w/c for pain mgt. Afterwards pt returned to bed in the same manner using the slideboard. She returned to bed with Min A. Left pt with all needs within reach and bed alarm set.    Therapy Documentation Precautions:  Precautions Precautions: Fall Precaution Comments: new L BKA Required Braces or Orthoses: Other Brace Other Brace: L LE limb guard Restrictions Weight Bearing Restrictions: Yes LLE Weight Bearing: Non weight bearing Pain: pt premedicated for pain and OT applied ice packs to the Lt hip at end of tx (1st session) and during 2nd session Pain Assessment Pain Scale: 0-10 Pain Score: 7  Pain Type: Surgical pain Pain Location: Leg Pain Orientation: Left Pain Descriptors / Indicators: Aching Pain Frequency: Intermittent Pain Onset: On-going Patients Stated Pain Goal: 2 Pain Intervention(s): Medication (See eMAR) ADL: ADL Upper Body Bathing: Contact guard Lower Body Bathing: Maximal assistance Where Assessed-Lower Body Bathing: Bed level Upper Body Dressing: Minimal assistance Where Assessed-Upper Body Dressing: Edge of bed Lower Body Dressing: Dependent Where Assessed-Lower Body Dressing: Bed level Toileting: Not assessed (incontinent of bowel, did not need to urinate)      Therapy/Group: Individual Therapy  Larri Brewton A Shalaunda Weatherholtz 01/29/2020, 12:44 PM

## 2020-01-29 NOTE — Progress Notes (Signed)
[facility] PHYSICAL MEDICINE & REHABILITATION PROGRESS NOTE   Subjective/Complaints: Patient seen sitting up in bed this morning.  She states she slept well overnight.  She states she cannot tolerate MiraLAX and requests a stool softener.  She was seen by nephrology yesterday, notes reviewed-no changes.  ROS: Denies CP, SOB, N/V/D  Objective:   No results found. Recent Labs    01/27/20 0441 01/27/20 1421  WBC 10.6* 11.6*  HGB 10.0* 9.7*  HCT 32.7* 31.6*  PLT 294 311   Recent Labs    01/27/20 0441 01/27/20 1444  NA 129* 127*  K 4.4 4.3  CL 92* 91*  CO2 24 26  GLUCOSE 90 111*  BUN 46* 51*  CREATININE 6.67* 7.03*  CALCIUM 8.9 8.6*    Intake/Output Summary (Last 24 hours) at 01/29/2020 2952 Last data filed at 01/28/2020 1831 Gross per 24 hour  Intake 360 ml  Output --  Net 360 ml     Pressure Injury 01/23/20 Buttocks Left Deep Tissue Pressure Injury - Purple or maroon localized area of discolored intact skin or blood-filled blister due to damage of underlying soft tissue from pressure and/or shear. Circular, dark, red area, surrounde (Active)  01/23/20 1609  Location: Buttocks  Location Orientation: Left  Staging: Deep Tissue Pressure Injury - Purple or maroon localized area of discolored intact skin or blood-filled blister due to damage of underlying soft tissue from pressure and/or shear.  Wound Description (Comments): Circular, dark, red area, surrounded by MASD  Present on Admission: Yes    Physical Exam: Vital Signs Blood pressure (!) 132/50, pulse 62, temperature 98.5 F (36.9 C), resp. rate 16, height 5\' 10"  (1.778 m), weight 94.9 kg, SpO2 98 %. Constitutional: No distress . Vital signs reviewed. HENT: Normocephalic.  Atraumatic. Eyes: EOMI. No discharge. Cardiovascular: No JVD.  RRR. Respiratory: Normal effort.  No stridor.  Bilateral clear to auscultation. GI: Non-distended.  BS +. Skin: Warm and dry.  Left BKA with shrinker CDI Psych: Normal mood.   Normal behavior. Musc: Left BKA with edema and tenderness Right lower extremity edema, stable Neuro: Alert Motor: Right lower extremity: Hip flexion, knee extension 4-5/5 (pain inhibition), ankle dorsiflexion 4+/5, unchanged Left lower extremity: Hip flexion, knee extension 3/5 (some pain inhibition), ankle dorsiflexion 4/5   Assessment/Plan: 1. Functional deficits which require 3+ hours per day of interdisciplinary therapy in a comprehensive inpatient rehab setting.  Physiatrist is providing close team supervision and 24 hour management of active medical problems listed below.  Physiatrist and rehab team continue to assess barriers to discharge/monitor patient progress toward functional and medical goals  Care Tool:  Bathing    Body parts bathed by patient: Right arm, Left arm, Chest, Abdomen, Front perineal area, Right upper leg, Face   Body parts bathed by helper: Buttocks, Right lower leg Body parts n/a: Left lower leg   Bathing assist Assist Level: Moderate Assistance - Patient 50 - 74% (at bed level)     Upper Body Dressing/Undressing Upper body dressing   What is the patient wearing?: Pull over shirt    Upper body assist Assist Level: Contact Guard/Touching assist    Lower Body Dressing/Undressing Lower body dressing      What is the patient wearing?: Underwear/pull up, Pants     Lower body assist Assist for lower body dressing: Total Assistance - Patient < 25%     Toileting Toileting Toileting Activity did not occur (Clothing management and hygiene only): N/A (no void or bm)  Toileting assist Assist for  toileting: Independent with assistive device Assistive Device Comment: female urinal   Transfers Chair/bed transfer  Transfers assist  Chair/bed transfer activity did not occur: Safety/medical concerns  Chair/bed transfer assist level: Minimal Assistance - Patient > 75%     Locomotion Ambulation   Ambulation assist   Ambulation activity did not  occur: Safety/medical concerns          Walk 10 feet activity   Assist  Walk 10 feet activity did not occur: Safety/medical concerns        Walk 50 feet activity   Assist Walk 50 feet with 2 turns activity did not occur: Safety/medical concerns         Walk 150 feet activity   Assist Walk 150 feet activity did not occur: Safety/medical concerns         Walk 10 feet on uneven surface  activity   Assist Walk 10 feet on uneven surfaces activity did not occur: Safety/medical concerns         Wheelchair     Assist Will patient use wheelchair at discharge?: Yes Type of Wheelchair: Manual    Wheelchair assist level: Supervision/Verbal cueing Max wheelchair distance: 150    Wheelchair 50 feet with 2 turns activity    Assist        Assist Level: Supervision/Verbal cueing   Wheelchair 150 feet activity     Assist      Assist Level: Supervision/Verbal cueing, Contact Guard/Touching assist   Medical Problem List and Plan: 1.  Impaired mobility and ADLs secondary to L BKA  Continue CIR 2.  Antithrombotics: -DVT/anticoagulation:  Pharmaceutical: Lovenox             -antiplatelet therapy: N/A 3. Chronic pain/Pain Management: Was on oxycodone 5 mg tid prn as well Cymbalta for neuropathy (at baseline)  Relatively controlled with meds on 11/28 4. Mood: Team to provide ego support/encouragement.              -antipsychotic agents: N/A 5. Neuropsych: This patient is capable of making decisions on her own behalf. 6. Skin/Wound Care: Monitor wound daily   Continue stump shrinker  7. Fluids/Electrolytes/Nutrition: Strict I/O. 1200 cc FR. Will consult dietician to educate on renal diet.  8. Left hip ORIF/hip abscess s/p I&D 11/18: NWB LLE.   Flagyl changed to Ertepenem due to patient refusal, continue  Continue cefepime 9. 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 T1DM with neuropathy: Hgb A1c-6.2. Was on  Lantus 50 units in am with 10 units novolog tid.     Lantus 38 twice daily, decreased to 10 on 11/27, increased to 15 on 11/28  NovoLog 5 units with breakfast, 8 units with lunch and dinner, decreased to 5 3 times daily on 11/27, DC'd on 11/27.  Monitor BS ac/hs and use SSI for elevated  BS.   Significantly labile, with severe symptomatic hypoglycemia 11. Now ESRD: Now on HD MWF (set up at [facility] post discharge)  12. Anemia of chronic disease/iron deficiency: Hgb-6.3 at admission--->9.1 s/p 4 units PRBC. Feraheme X 1 (additional dose held due to infection) and Aranesp weekly.   Hemoglobin 9.7 on 11/26  Continue to monitor 13. HTN: Monitor BP tid--continue Coreg bid, Norvasc and apresoline tid.   Decreased Coreg to 12.5mg  BID.   Controlled on 11/28 14. IBS-D:   MiraLAX DC'd  Resumed Lotrenex    Colace as needed ordered 15. Malnutrition: Continue protein supplement.  16. Abdominal pain: Due to  diarrhea? PPI resumed-->patient to have family bring in [place] from home.  17.  Sleep disturbance:   Low dose elavil at bedtime.  Melatonin added on 11/26  Improving overall 18. Leukocytosis:   WBCs 11.6 on 11/26, labs with HD  See #8, #9  Afebrile  LOS: 6 days A FACE TO FACE EVALUATION WAS PERFORMED  [name] Lorie Phenix 01/29/2020, 8:08 AM

## 2020-01-29 NOTE — Progress Notes (Signed)
[facility] Progress Note   Subjective:  Seen during PT again - going well, she is a little winded with exertion today, fine at rest. Still with RLE edema.   Objective Vitals:   01/28/20 0802 01/28/20 1403 01/28/20 1930 01/29/20 0500  BP: (!) 128/52 133/69 (!) 125/50 (!) 132/50  Pulse:  63 66 62  Resp:  '17 15 16  ' Temp:  98 F (36.7 C) 98.5 F (36.9 C) 98.5 F (36.9 C)  TempSrc:  Oral Oral   SpO2:  100% 97% 98%  Weight:    94.9 kg  Height:       Physical Exam General:Well appearing woman, NAD Heart:RRR; no murmur Lungs:CTAB, no rales Abdomen:soft, non-tender Extremities:L BKA in large brace, 2+ RLE edema Dialysis Access:R TDC, LUE AVF + thrill  Additional Objective Labs: Basic Metabolic Panel: Recent Labs  Lab 01/22/20 1503 01/22/20 1503 01/24/20 1015 01/27/20 0441 01/27/20 1444  NA 130*   < > 130* 129* 127*  K 3.8   < > 3.9 4.4 4.3  CL 95*   < > 94* 92* 91*  CO2 24   < > '23 24 26  ' GLUCOSE 135*   < > 52* 90 111*  BUN 34*   < > 31* 46* 51*  CREATININE 5.41*   < > 5.58* 6.67* 7.03*  CALCIUM 8.4*   < > 8.8* 8.9 8.6*  PHOS 3.4  --  3.7  --  5.0*   < > = values in this interval not displayed.   Liver Function Tests: Recent Labs  Lab 01/22/20 1503 01/24/20 1015 01/27/20 1444  ALBUMIN 1.7* 1.8* 1.9*   CBC: Recent Labs  Lab 01/23/20 1030 01/23/20 1030 01/24/20 1015 01/27/20 0441 01/27/20 1421  WBC 11.5*   < > 12.6* 10.6* 11.6*  NEUTROABS 7.8*  --   --  6.5  --   HGB 9.4*   < > 10.1* 10.0* 9.7*  HCT 30.6*   < > 32.0* 32.7* 31.6*  MCV 92.2  --  91.4 91.3 91.3  PLT 277   < > 341 294 311   < > = values in this interval not displayed.   Medications: . sodium chloride    . sodium chloride    . ertapenem 500 mg (01/28/20 1807)   . (feeding supplement) PROSource Plus  30 mL Oral BID BM  . alosetron  0.5 mg Oral BID  . amitriptyline  10 mg Oral QHS  . amLODipine  10 mg Oral Daily  . atorvastatin  40 mg Oral q1800  . carvedilol   12.5 mg Oral BID WC  . Chlorhexidine Gluconate Cloth  6 each Topical Q0600  . [START ON 01/30/2020] darbepoetin (ARANESP) injection - DIALYSIS  100 mcg Intravenous Q Mon-HD  . DULoxetine  60 mg Oral Daily  . enoxaparin (LOVENOX) injection  30 mg Subcutaneous Q24H  . hydrALAZINE  100 mg Oral TID  . insulin aspart  0-5 Units Subcutaneous QHS  . insulin aspart  0-9 Units Subcutaneous TID WC  . insulin glargine  15 Units Subcutaneous QHS  . melatonin  1.5 mg Oral QHS  . multivitamin  1 tablet Oral QHS  . pantoprazole  40 mg Oral Daily  . sevelamer carbonate  800 mg Oral TID WC    Dialysis Orders: Pending.  Assessment/Plan: 1. AoCKD 5,now ESRD: Baseline Creat 4.9- 5.2, eGFR 9-11 from 8/21 - 10/21. Creat 6 on admission, then mid 7's, IVF's given w/o improvementso started HD. S/p TDC and AVF on11/8,  1st HD 11/9. Continue MWF schedule- next HD 11/29. 2. Anemiaof ESRD- Hgb6.3 on admit. S/p 4U PRBCs.Tsat low 10%, got 565m Feraheme on 11/6, holding further doses due to infection. IncreasedAranesp 60 to 100 q week on 11/17. Last Hgb 9.7. 3. 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 L hip fracture - admission diagnosis.S/p ORIF on 10/28 per ortho.S/p I&D surgical wound 11/18.Wasrefusing Flagyl, now looks like on Ertapenem. 5. HTN/ vol - BP's are stable, carvedilol was decreased then stopped due to bradycardia.With hyponatremia and edema,increasing UF goals with HD and reviewed fluid restrictions.  6. DM - on insulin, per pmd 7. BMM -CorrCa high initially - now improved. No VDRA.Started binder this admission and phos at goal now. PTH 44.  8. Nutrition: albumin very low, on protein supplement.  KVeneta Penton PA-C 01/29/2020, 10:56 AM  [employer]

## 2020-01-29 NOTE — Progress Notes (Signed)
Physical Therapy Session Note  Patient Details  Name: [name] MRN: [id number] Date of Birth: [birth year]  Today's Date: 01/29/2020 PT Individual Time: 2440-1027 PT Individual Time Calculation (min): 72 min   Short Term Goals: Week 1:  PT Short Term Goal 1 (Week 1): Patient will perform bed mobility supine to sit with min A; sit to supine with mod A PT Short Term Goal 2 (Week 1): Patient will perform bed to chair transfers with slide board and mod A PT Short Term Goal 3 (Week 1): Patient to propel w/c with S x 100' PT Short Term Goal 4 (Week 1): patient to perform sit to stand with max A of 1  Skilled Therapeutic Interventions/Progress Updates:     Pt received supine in bed and agrees to therapy. Reports soreness in L hip. Number not provided but pt reports having just taken pain medication. PT provides rest breaks and mobility to manage pain. Supine to sit with minA and use of bed features. Pt performs slideboard transfer from bed to [facility] with minA and cues on body mechanics. Pt self propels WC x120' to gym with bilateral upper extremities and verbal cues on propulsion technique for efficiency. Squat pivot from WC to mat table with modA HHA. Pt performs multiple reps of sit to stand from slightly elevated mat, with use of RW. Initially pt requires modA +2 and progresses to modA +1. PT cues for head hips relationship and sequencing to assist with transfer. In standing, pt performs heel raises on R leg for strengthening and balance training. PT provides tactile facilitation of K knee extension but pt does not have any buckling. 3x5 reps total.   Pt performs sit to supine with minA/modA. Wedge positioned under trunk from comfort. Pt performs supine therex including 1x15: Quad sets Glute sets Hip abduction  SLRs (AAROM on L side)          Pt requires multiple rest breaks during activity due to spasming type pain in L hip. Supine to sit with minA and cues on posioitning. Squat pivot to WC  with modA. Pt left seated in WC with all needs within reach.          Therapy Documentation Precautions:  Precautions Precautions: Fall Precaution Comments: new L BKA Required Braces or Orthoses: Other Brace Other Brace: L LE limb guard Restrictions Weight Bearing Restrictions: Yes LLE Weight Bearing: Non weight bearing   Therapy/Group: Individual Therapy  Breck Coons 01/29/2020, 10:18 AM

## 2020-01-30 ENCOUNTER — Inpatient Hospital Stay (HOSPITAL_COMMUNITY): Payer: Medicaid Other | Admitting: Occupational Therapy

## 2020-01-30 ENCOUNTER — Encounter (HOSPITAL_COMMUNITY): Payer: Medicaid Other | Admitting: Psychology

## 2020-01-30 ENCOUNTER — Inpatient Hospital Stay (HOSPITAL_COMMUNITY): Payer: Medicaid Other

## 2020-01-30 DIAGNOSIS — N186 End stage renal disease: Secondary | ICD-10-CM | POA: Diagnosis not present

## 2020-01-30 DIAGNOSIS — S88112A Complete traumatic amputation at level between knee and ankle, left lower leg, initial encounter: Secondary | ICD-10-CM | POA: Diagnosis not present

## 2020-01-30 DIAGNOSIS — E1065 Type 1 diabetes mellitus with hyperglycemia: Secondary | ICD-10-CM | POA: Diagnosis not present

## 2020-01-30 DIAGNOSIS — G8918 Other acute postprocedural pain: Secondary | ICD-10-CM | POA: Diagnosis not present

## 2020-01-30 LAB — RENAL FUNCTION PANEL
Albumin: 1.9 g/dL — ABNORMAL LOW (ref 3.5–5.0)
Anion gap: 11 (ref 5–15)
BUN: 55 mg/dL — ABNORMAL HIGH (ref 6–20)
CO2: 25 mmol/L (ref 22–32)
Calcium: 8 mg/dL — ABNORMAL LOW (ref 8.9–10.3)
Chloride: 93 mmol/L — ABNORMAL LOW (ref 98–111)
Creatinine, Ser: 7.35 mg/dL — ABNORMAL HIGH (ref 0.44–1.00)
GFR, Estimated: 6 mL/min — ABNORMAL LOW (ref >60)
Glucose, Bld: 207 mg/dL — ABNORMAL HIGH (ref 70–99)
Phosphorus: 5.7 mg/dL — ABNORMAL HIGH (ref 2.5–4.6)
Potassium: 4.4 mmol/L (ref 3.5–5.1)
Sodium: 129 mmol/L — ABNORMAL LOW (ref 135–145)

## 2020-01-30 LAB — GLUCOSE, CAPILLARY
Glucose-Capillary: 201 mg/dL — ABNORMAL HIGH (ref 70–99)
Glucose-Capillary: 167 mg/dL — ABNORMAL HIGH (ref 70–99)
Glucose-Capillary: 188 mg/dL — ABNORMAL HIGH (ref 70–99)
Glucose-Capillary: 177 mg/dL — ABNORMAL HIGH (ref 70–99)
Glucose-Capillary: 245 mg/dL — ABNORMAL HIGH (ref 70–99)

## 2020-01-30 LAB — CBC
HCT: 30.2 % — ABNORMAL LOW (ref 36.0–46.0)
Hemoglobin: 9.2 g/dL — ABNORMAL LOW (ref 12.0–15.0)
MCH: 27.6 pg (ref 26.0–34.0)
MCHC: 30.5 g/dL (ref 30.0–36.0)
MCV: 90.7 fL (ref 80.0–100.0)
Platelets: 302 10*3/uL (ref 150–400)
RBC: 3.33 MIL/uL — ABNORMAL LOW (ref 3.87–5.11)
RDW: 15 % (ref 11.5–15.5)
WBC: 10.3 10*3/uL (ref 4.0–10.5)
nRBC: 0 % (ref 0.0–0.2)

## 2020-01-30 MED ORDER — DARBEPOETIN ALFA 100 MCG/0.5ML IJ SOSY
PREFILLED_SYRINGE | INTRAMUSCULAR | Status: AC
  Administered 2020-01-30: 100 ug via INTRAVENOUS
  Filled 2020-01-30: qty 0.5

## 2020-01-30 MED ORDER — SODIUM CHLORIDE 0.9 % IV SOLN
INTRAVENOUS | Status: DC | PRN
  Administered 2020-01-30 – 2020-02-08 (×3): 250 mL via INTRAVENOUS
  Administered 2020-02-09: 10 mL via INTRAVENOUS
  Administered 2020-02-10 – 2020-02-17 (×4): 250 mL via INTRAVENOUS

## 2020-01-30 MED ORDER — HEPARIN SODIUM (PORCINE) 1000 UNIT/ML IJ SOLN
INTRAMUSCULAR | Status: AC
  Filled 2020-01-30: qty 1

## 2020-01-30 NOTE — Progress Notes (Signed)
[facility] Progress Note   Subjective: Seen in room. No C/Os. HD today.   Objective Vitals:   01/29/20 1323 01/29/20 2003 01/30/20 0500 01/30/20 0519  BP: (!) 128/48 (!) 130/57  (!) 131/57  Pulse: 63 63  63  Resp: 16 17  18  Temp: 98.3 F (36.8 C) 98.9 F (37.2 C)  98.6 F (37 C)  TempSrc: Oral Oral    SpO2: 97% 95%  95%  Weight:   94.8 kg   Height:       Physical Exam General: Pleasant female in NAD Heart: S1,S2 RRR Lungs: CTAB Abdomen: S, NT Extremities: L BKA. Trace-1+ RLE edema Dialysis Access: RIJ TDC Drsg CDI. Maturing L AVF + T/B.    Additional Objective Labs: Basic Metabolic Panel: Recent Labs  Lab 01/24/20 1015 01/27/20 0441 01/27/20 1444  NA 130* 129* 127*  K 3.9 4.4 4.3  CL 94* 92* 91*  CO2 23 24 26  GLUCOSE 52* 90 111*  BUN 31* 46* 51*  CREATININE 5.58* 6.67* 7.03*  CALCIUM 8.8* 8.9 8.6*  PHOS 3.7  --  5.0*   Liver Function Tests: Recent Labs  Lab 01/24/20 1015 01/27/20 1444  ALBUMIN 1.8* 1.9*   No results for input(s): LIPASE, AMYLASE in the last 168 hours. CBC: Recent Labs  Lab 01/24/20 1015 01/27/20 0441 01/27/20 1421  WBC 12.6* 10.6* 11.6*  NEUTROABS  --  6.5  --   HGB 10.1* 10.0* 9.7*  HCT 32.0* 32.7* 31.6*  MCV 91.4 91.3 91.3  PLT 341 294 311   Blood Culture    Component Value Date/Time   SDES ABSCESS LEFT HIP 01/19/2020 1424   SPECREQUEST PT ON ANCEF 01/19/2020 1424   CULT  01/19/2020 1424    RARE ENTEROBACTER CLOACAE MODERATE BACTEROIDES FRAGILIS BETA LACTAMASE POSITIVE Performed at [facility], [address] [place], [place] [address]    REPTSTATUS 01/22/2020 FINAL 01/19/2020 1424    Cardiac Enzymes: No results for input(s): CKTOTAL, CKMB, CKMBINDEX, TROPONINI in the last 168 hours. CBG: Recent Labs  Lab 01/29/20 1124 01/29/20 1558 01/29/20 2048 01/30/20 0606 01/30/20 1126  GLUCAP 158* 139* 170* 167* 188*   Iron Studies: No results for input(s): IRON, TIBC, TRANSFERRIN,  FERRITIN in the last 72 hours. @lablastinr3@ Studies/Results: No results found. Medications: . sodium chloride    . sodium chloride    . ertapenem 500 mg (01/29/20 1816)   . (feeding supplement) PROSource Plus  30 mL Oral BID BM  . alosetron  0.5 mg Oral BID  . amitriptyline  10 mg Oral QHS  . amLODipine  10 mg Oral Daily  . atorvastatin  40 mg Oral q1800  . carvedilol  12.5 mg Oral BID WC  . Chlorhexidine Gluconate Cloth  6 each Topical Q0600  . darbepoetin (ARANESP) injection - DIALYSIS  100 mcg Intravenous Q Mon-HD  . DULoxetine  60 mg Oral Daily  . enoxaparin (LOVENOX) injection  30 mg Subcutaneous Q24H  . hydrALAZINE  100 mg Oral TID  . insulin aspart  0-5 Units Subcutaneous QHS  . insulin aspart  0-9 Units Subcutaneous TID WC  . insulin glargine  15 Units Subcutaneous QHS  . melatonin  1.5 mg Oral QHS  . multivitamin  1 tablet Oral QHS  . pantoprazole  40 mg Oral Daily  . sevelamer carbonate  800 mg Oral TID WC     Assessment/Plan: 1. AoCKD 5,now ESRD: Baseline Creat 4.9- 5.2, eGFR 9-11 from 8/21 - 10/21. Creat 6 on admission,   then mid 7's, IVF's given w/o improvementso started HD. Has been clipped to [facility]. S/p [facility] and AVF on11/8, 1st HD 11/9. Continue MWF schedule-next HD 11/29. 2. Anemiaof ESRD- Hgb6.3 on admit. S/p 4U PRBCs.Tsat low 10%, got 549m Feraheme on 11/6, holding further doses due to infection. IncreasedAranesp 60 to 100 q week on 11/17.Last Hgb 9.7. 3. 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 L hip fracture - admission diagnosis.S/p ORIF on 10/28 per ortho.S/p I&D surgical wound 11/18.Wasrefusing Flagyl, now looks like on Ertapenem. 5. HTN/ vol - BP's are stable, carvedilol was decreased then stopped due to bradycardia.With hyponatremia and edema,increasing UF goals with HD and reviewed fluid restrictions.  6. DM - on insulin, per pmd 7. BMM -CorrCa high initially - now improved. No  VDRA.Started binder this admission and phos at goal now. PTH 44.  8. Nutrition: albumin very low, on protein supplement.  Donna Hidalgo H. Oberia Beaudoin NP-C 01/30/2020, 1:11 PM  CNewell [phone]

## 2020-01-30 NOTE — Progress Notes (Signed)
[place] PHYSICAL MEDICINE & REHABILITATION PROGRESS NOTE   Subjective/Complaints:   Pt reports doing well- LBM last evening.  Pain ok currently- hip overall less painful.  Slept well.   ROS:  Pt denies SOB, abd pain, CP, N/V/C/D, and vision changes   Objective:   No results found. Recent Labs    01/27/20 1421  WBC 11.6*  HGB 9.7*  HCT 31.6*  PLT 311   Recent Labs    01/27/20 1444  NA 127*  K 4.3  CL 91*  CO2 26  GLUCOSE 111*  BUN 51*  CREATININE 7.03*  CALCIUM 8.6*    Intake/Output Summary (Last 24 hours) at 01/30/2020 0949 Last data filed at 01/30/2020 0800 Gross per 24 hour  Intake 517 ml  Output --  Net 517 ml     Pressure Injury 01/23/20 Buttocks Left Deep Tissue Pressure Injury - Purple or maroon localized area of discolored intact skin or blood-filled blister due to damage of underlying soft tissue from pressure and/or shear. Circular, dark, red area, surrounde (Active)  01/23/20 1609  Location: Buttocks  Location Orientation: Left  Staging: Deep Tissue Pressure Injury - Purple or maroon localized area of discolored intact skin or blood-filled blister due to damage of underlying soft tissue from pressure and/or shear.  Wound Description (Comments): Circular, dark, red area, surrounded by MASD  Present on Admission: Yes    Physical Exam: Vital Signs Blood pressure (!) 131/57, pulse 63, temperature 98.6 F (37 C), resp. rate 18, height 5\' 10"  (1.778 m), weight 94.8 kg, SpO2 95 %. Constitutional: sitting up in bed- with OT in room, NAD. HENT: Normocephalic.  Atraumatic. Eyes: EOMI. No discharge. Cardiovascular: RRR_ no JVD Respiratory: CTA B/L- no W/R/R- good air movement GI: Soft, NT, ND, (+)BS  Skin: Warm and dry.  Left BKA with shrinker CDI- no change Psych: appropriate. Musc: Left BKA with edema and tenderness Right lower extremity edema, stable Neuro: Alert Motor: Right lower extremity: Hip flexion, knee extension 4-5/5 (pain  inhibition), ankle dorsiflexion 4+/5, unchanged Left lower extremity: Hip flexion, knee extension 3/5 (some pain inhibition), ankle dorsiflexion 4/5   Assessment/Plan: 1. Functional deficits which require 3+ hours per day of interdisciplinary therapy in a comprehensive inpatient rehab setting.  Physiatrist is providing close team supervision and 24 hour management of active medical problems listed below.  Physiatrist and rehab team continue to assess barriers to discharge/monitor patient progress toward functional and medical goals  Care Tool:  Bathing    Body parts bathed by patient: Right arm, Left arm, Chest, Abdomen, Front perineal area, Right upper leg, Face   Body parts bathed by helper: Buttocks, Right lower leg Body parts n/a: Left lower leg   Bathing assist Assist Level: Moderate Assistance - Patient 50 - 74%     Upper Body Dressing/Undressing Upper body dressing   What is the patient wearing?: Pull over shirt    Upper body assist Assist Level: Contact Guard/Touching assist    Lower Body Dressing/Undressing Lower body dressing      What is the patient wearing?: Underwear/pull up, Pants     Lower body assist Assist for lower body dressing: Total Assistance - Patient < 25%     Toileting Toileting Toileting Activity did not occur Landscape architect and hygiene only): N/A (no void or bm)  Toileting assist Assist for toileting: Independent with assistive device Assistive Device Comment: female urinal   Transfers Chair/bed transfer  Transfers assist  Chair/bed transfer activity did not occur: Safety/medical concerns  Chair/bed  transfer assist level: Minimal Assistance - Patient > 75%     Locomotion Ambulation   Ambulation assist   Ambulation activity did not occur: Safety/medical concerns          Walk 10 feet activity   Assist  Walk 10 feet activity did not occur: Safety/medical concerns        Walk 50 feet activity   Assist Walk 50  feet with 2 turns activity did not occur: Safety/medical concerns         Walk 150 feet activity   Assist Walk 150 feet activity did not occur: Safety/medical concerns         Walk 10 feet on uneven surface  activity   Assist Walk 10 feet on uneven surfaces activity did not occur: Safety/medical concerns         Wheelchair     Assist Will patient use wheelchair at discharge?: Yes Type of Wheelchair: Manual    Wheelchair assist level: Supervision/Verbal cueing Max wheelchair distance: 120'    Wheelchair 50 feet with 2 turns activity    Assist        Assist Level: Supervision/Verbal cueing   Wheelchair 150 feet activity     Assist      Assist Level: Supervision/Verbal cueing, Contact Guard/Touching assist   Medical Problem List and Plan: 1.  Impaired mobility and ADLs secondary to L BKA  Continue CIR 2.  Antithrombotics: -DVT/anticoagulation:  Pharmaceutical: Lovenox             -antiplatelet therapy: N/A 3. Chronic pain/Pain Management: Was on oxycodone 5 mg tid prn as well Cymbalta for neuropathy (at baseline)  11/29- pain controlled con't meds/regimen 4. Mood: Team to provide ego support/encouragement.              -antipsychotic agents: N/A 5. Neuropsych: This patient is capable of making decisions on her own behalf. 6. Skin/Wound Care: Monitor wound daily   Continue stump shrinker  7. Fluids/Electrolytes/Nutrition: Strict I/O. 1200 cc FR. Will consult dietician to educate on renal diet.  8. Left hip ORIF/hip abscess s/p I&D 11/18: NWB LLE.   Flagyl changed to Ertepenem due to patient refusal, continue  Continue cefepime 9. 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 T1DM with neuropathy: Hgb A1c-6.2. Was on Lantus 50 units in am with 10 units novolog tid.     Lantus 38 twice daily, decreased to 10 on 11/27, increased to 15 on 11/28  NovoLog 5 units with breakfast, 8 units with lunch and dinner,  decreased to 5 3 times daily on 11/27, DC'd on 11/27.  Monitor BS ac/hs and use SSI for elevated  BS.   Significantly labile, with severe symptomatic hypoglycemia  11/29- BGs 129-230 off Novolog with meals- will con't for now.  11. Now ESRD: Now on HD MWF (set up at [facility] post discharge)  12. Anemia of chronic disease/iron deficiency: Hgb-6.3 at admission--->9.1 s/p 4 units PRBC. Feraheme X 1 (additional dose held due to infection) and Aranesp weekly.   Hemoglobin 9.7 on 11/26  Continue to monitor 13. HTN: Monitor BP tid--continue Coreg bid, Norvasc and apresoline tid.   Decreased Coreg to 12.5mg  BID.   Controlled on 11/28 14. IBS-D:   MiraLAX DC'd  Resumed Lotrenex    Colace as needed ordered  11/29- bowels moving well per pt- con't regimen 15. Malnutrition: Continue protein supplement.  16. Abdominal pain: Due to diarrhea? PPI resumed-->patient to have family bring in  dexilant from home.  17.  Sleep disturbance:   Low dose elavil at bedtime.  Melatonin added on 11/26  Improving overall 18. Leukocytosis:   WBCs 11.6 on 11/26, labs with HD  See #8, #9  Afebrile  LOS: 7 days A FACE TO FACE EVALUATION WAS PERFORMED  Abdulaziz Toman 01/30/2020, 9:49 AM

## 2020-01-30 NOTE — Progress Notes (Signed)
Physical Therapy Session Note  Patient Details  Name: [name] MRN: [id number] Date of Birth: 1965-12-16  Today's Date: 01/30/2020 PT Individual Time: 9509-3267 PT Individual Time Calculation (min): 55 min   Short Term Goals: Week 1:  PT Short Term Goal 1 (Week 1): Patient will perform bed mobility supine to sit with min A; sit to supine with mod A PT Short Term Goal 2 (Week 1): Patient will perform bed to chair transfers with slide board and mod A PT Short Term Goal 3 (Week 1): Patient to propel w/c with S x 100' PT Short Term Goal 4 (Week 1): patient to perform sit to stand with max A of 1  Skilled Therapeutic Interventions/Progress Updates:     Pt received supine in bed with RN present, having just received pain meds. Pt reports significant pain in L hip. PT provides frequent rest breaks to manage pain symptoms. Supine to sit with supervision, increased time, and cues on positioning and use of bed features. Squat pivot from elevated bed to WC with maxA. Pt self propels WC x180' with B upper extremities and PT cues for incresed shoulder extension for efficiency. Pt attempts to transfer to Nustep but each time PT cues to initiate transfer, pt has sharp increase in hip pain. Activity adjusted to accommodate pain symptoms. Pt performs R lower extremity therex on kinetron at 20 cm/sec. 4x30 with seated rest break. Activity performed for strengthening of R hamstring and glute muscles. Pt reports that pain in L hip calmed down so PT attempts to have pt practice sit to stand transfers. Pt unable to perform due to continued L hip pain. Return to room and pt performs squat pivot back to bed with modA and use of slideboard. Sit to supine with minA. Left with alarm intact and all needs within reach.   Therapy Documentation Precautions:  Precautions Precautions: Fall Precaution Comments: new L BKA Required Braces or Orthoses: Other Brace Other Brace: L LE limb guard Restrictions Weight  Bearing Restrictions: Yes LLE Weight Bearing: Non weight bearing  Therapy/Group: Individual Therapy  Breck Coons, PT DPT 01/30/2020, 3:59 PM

## 2020-01-30 NOTE — Consult Note (Signed)
Neuropsychological Consultation   Patient:   [name]   DOB:   [birth year]  MR Number:  [id number]  Location:  [facility] [address] CENTER [address] [place] [id number] [place] [address] Dept: [place]: [phone]           Date of Service:   01/30/2020  Start Time:   10 AM End Time:   11 AM  Provider/Observer:  Ilean Skill, Psy.D.       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  Patient was admitted on 12/28/2019 after fall with left hip fracture.  Patient underwent IM nailing of left hip by Dr. Doran Durand.  Postoperatively the patient had worsening of renal status with fluid overload, metabolic acidosis, lethargy concerning for uremia as well as progressive leukocytosis.  Initially, the patient declined MRI of left foot and it was subsequently done on 11/7 and showed a 3.3 cm x 2.9 cm abscess with extensive osteomyelitis and numerous small bone abscesses.  Patient was started on IV antibiotics and hemodialysis was initiated.  Dr. Doran Durand consulted and recommended left BKA for definitive treatment.  Patient was agreeable to undergo left BKA on 11/11.  Patient had an infection develop left hip surgical site and went in for debridement and antibiotic.  Patient continued with significant pain in her left hip.  Reason for Service:  Patient referred for neuropsychological consultation due to coping and adjustment issues following left BKA and dealing with numerous medical issues including chronic kidney disease and type 1 diabetes.  Below see HPI for the current admission.  HPI:  [name] is a 54 year old female with history of HTN, T1DM with neuropathy, CKD V, CVA, left  foot Chopart amputation with chronic wound and L-SFA who was admitted on 12/28/19 after fall with left IT hip fracture. She underwent IM nailing of Left hip by Dr. Doran Durand with Sq lovenox for DVT prophylaxis. Post op had worsening of renal status with fluid overload, metabolic acidosis, lethargy concerning for uremia as well as progressive leucocytosis but declined MRI of left foot initially. MRI was done 11/7 and showed 3.3 cm X 2.9 cm abscess with extensive osteomyelitis and numerous small bone abscesses. She was started on IV ancef.  Due to acute on chronic renal failure tunneled catheter was placed by Dr. Stanford Breed on 11/08 and HD initiated.   Dr. Doran Durand consulted for input and recommended L-BKA for definitive treatment. Patient was agreeable to undergo L-BKA on 11/11 and is NWB LLE with stump sock/stump protector. She was found to develop purulent drainage from left hip incision on 11/18 and underwent I& D of hip abscess with debridement of skin, SQ tissue and muscle with drain placement by Dr. Lyla Glassing. Dr. Linus Salmons consulted for input on antibiotic coverage as wound culture positive for enterobacter cloacae--->now on Cefepime and flagyl for 4 weeks. pain control improving--not taking IV dilaudid since yesterday. Penrose drain removed from left hip today. Therapy ongoing and patient limited by weakness, anxiety and pain affecting ADLs and mobility.    CIR recommended due to functional decline.  Current Status:  Patient was alert and oriented sitting back in her bed slightly inclined.  Patient had just finished PT session and was  complaining of significant left hip pain.  Patient denied any significant pain at her amputation site.  On multiple occasions during our discussion patient winced in pain when shifting position slightly.  Patient acknowledged having difficulty coping with the numerous and ongoing medical complications that she has been facing.  She denied severe depression but was acknowledging  difficulty coping with what she called "1 thing after another."  Patient does have a past history of anxiety and coping issues and acknowledged some worry about "what is going to happen next."  She denied anxiety or depressive symptoms to be a level that are keeping her from doing her therapeutic interventions but does acknowledge that anxiety and fear of acute pain sensations do limit her to some degree in therapy.  Behavioral Observation: [name]  presents as a 54 y.o.-year-old Right Caucasian Female who appeared her stated age. her dress was Appropriate and she was Well Groomed and her manners were Appropriate to the situation.  her participation was indicative of Appropriate and Attentive behaviors.  There were physical disabilities noted.  she displayed an appropriate level of cooperation and motivation.     Interactions:    Active Appropriate and Attentive  Attention:   within normal limits and attention span and concentration were age appropriate  Memory:   within normal limits; recent and remote memory intact  Visuo-spatial:  not examined  Speech (Volume):  low  Speech:   normal; normal  Thought Process:  Coherent and Relevant  Though Content:  WNL; not suicidal and not homicidal  Orientation:   person, place, time/date and situation  Judgment:   Good  Planning:   Fair  Affect:    Appropriate  Mood:    Dysphoric  Insight:   Good  Intelligence:   normal  Medical History:   Past Medical History:  Diagnosis Date  . Anxiety 2002  . Chest tightness   . Depression 2001  . Diabetes type 1, uncontrolled ([place])    at age 50  . Diabetic neuropathy ([place])   . Essential hypertension 2015  . GERD (gastroesophageal reflux disease)    about age of 46  . Nausea and vomiting in adult   . Stroke ([place])   . Urinary frequency   . Yeast vaginitis         Psychiatric History:  Patient does have a past history of anxiety and depressive symptomatology.  Patient has been  followed for her depression anxiety by her PCP Antony Blackbird, MD and the patient had been on Cymbalta and continues to take Cymbalta during her hospitalization.  Family Med/Psych History:  Family History  Problem Relation Age of Onset  . Diabetes Mother   . Heart attack Mother   . Heart disease Mother        before age 70  . Hypertension Mother   . Hyperlipidemia Mother   . Diabetes Father   . Heart attack Father   . Heart disease Father        before age 75  . Diabetes Sister   . 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  Patient was admitted on 12/28/2019 after fall with left hip fracture.  Patient underwent IM nailing of left hip by Dr. Doran Durand.  Postoperatively the patient had worsening of renal status with fluid overload, metabolic  acidosis, lethargy concerning for uremia as well as progressive leukocytosis.  Initially, the patient declined MRI of left foot and it was subsequently done on 11/7 and showed a 3.3 cm x 2.9 cm abscess with extensive osteomyelitis and numerous small bone abscesses.  Patient was started on IV antibiotics and hemodialysis was initiated.  Dr. Doran Durand consulted and recommended left BKA for definitive treatment.  Patient was agreeable to undergo left BKA on 11/11.  Patient had an infection develop left hip surgical site and went in for debridement and antibiotic.  Patient continued with significant pain in her left hip.  Patient was alert and oriented sitting back in her bed slightly inclined.  Patient had just finished PT session and was complaining of significant left hip pain.  Patient denied any significant pain at her amputation site.  On multiple occasions during our discussion patient winced in pain when shifting position slightly.  Patient acknowledged having  difficulty coping with the numerous and ongoing medical complications that she has been facing.  She denied severe depression but was acknowledging difficulty coping with what she called "1 thing after another."  Patient does have a past history of anxiety and coping issues and acknowledged some worry about "what is going to happen next."  She denied anxiety or depressive symptoms to be a level that are keeping her from doing her therapeutic interventions but does acknowledge that anxiety and fear of acute pain sensations do limit her to some degree in therapy.  Disposition/Plan:  Today we worked on coping and adjustment issues.  The patient denied any significant worsening of her anxiety depression but does acknowledge worry and distress regarding the numerous medical issues she is facing on top of recent left BKA and left hip fracture.  I will follow up with the patient first of next week.  Diagnosis:    L-BKA, recent L-hip fx, T1DM, CKD-IV        Electronically Signed   _______________________ Ilean Skill, Psy.D.

## 2020-01-30 NOTE — Progress Notes (Signed)
Occupational Therapy Session Note  Patient Details  Name: [name] MRN: [id number] Date of Birth: 03-13-1965  Today's Date: 01/30/2020 OT Individual Time: 8466-5993 and 5701-7793 OT Individual Time Calculation (min): 63 min and 57 min Missed time: 10 mins in am session d/t patient needing time to eat breakfast   Short Term Goals: Week 1:  OT Short Term Goal 1 (Week 1): Pt will transfer with 1 assist in prep for toilet transfer and to decrease BOC OT Short Term Goal 2 (Week 1): Pt will perform LB dressing with Max A of 1 OT Short Term Goal 3 (Week 1): Pt will perform LB bathing with AE PRN with Mod A OT Short Term Goal 4 (Week 1): Pt will perform ADL activity for 10 mins without fatigue   Skilled Therapeutic Interventions/Progress Updates:    Session 1: Pt reclined in bed sleeping but easily woken, agreeable to OT session. Pt noted to have breakfast sitting on tray, not eaten and requested a few minutes to eat breakfast before getting cold. Pt missed 10 minutes at beginning of session to be able to eat breakfast. Upon returning to her room, pt agreeable to OT session with mild hip discomfort, called at end of session for pain meds and no number given. Supine to sit EOB CGA with HOB elevated, slide board transfer bed > w/c Min A after placement, self propel to sink and brushed teeth Mod I, UB bathing with Supervision at sink level, donned new shirt with CGA. Slide board transfer back to bed Min A after placement and sit to supine Min A to perform LB dressing to change pants with Max A with rolling left/right. Rolled right Mod A, rolled left Min/CGA and able to help don over hips in side lying. Assist to thread over feet but need to provide reacher next session. Pt positioned for comfort, alarm on call bell in reach.    Session 2: Pt greeted at time of session reclined in bed resting agreeable to OT session but declining getting OOB or sitting up EOB as she was in too much pain, no number  given, premedicated as RN just gave meds "10 mins before session." Semi reclined HOB approx 40* upright for bed level there ex with 3# dowel for chest press, overhead raises, bicep curls, FWD and backward circles all for 2x20 each with rest breaks in between sets. Dynamic seated/core strengthening with 1kg weighted ball toss for 2x10 multiple directions to improve reaction speed and BUE strengthening. Pt scooted up in bed with assist to stabilize RLE on bed surface, otherwise able to pull self up in bed. Positioned for comfort. Alarm on, call bell in reach.   Therapy Documentation Precautions:  Precautions Precautions: Fall Precaution Comments: new L BKA Required Braces or Orthoses: Other Brace Other Brace: L LE limb guard Restrictions Weight Bearing Restrictions: Yes LLE Weight Bearing: Non weight bearing    Therapy/Group: Individual Therapy  [name] 01/30/2020, 10:00 AM

## 2020-01-31 ENCOUNTER — Inpatient Hospital Stay (HOSPITAL_COMMUNITY): Payer: Medicaid Other | Admitting: Occupational Therapy

## 2020-01-31 ENCOUNTER — Inpatient Hospital Stay (HOSPITAL_COMMUNITY): Payer: Medicaid Other | Admitting: Physical Therapy

## 2020-01-31 DIAGNOSIS — S88112A Complete traumatic amputation at level between knee and ankle, left lower leg, initial encounter: Secondary | ICD-10-CM | POA: Diagnosis not present

## 2020-01-31 LAB — GLUCOSE, CAPILLARY
Glucose-Capillary: 170 mg/dL — ABNORMAL HIGH (ref 70–99)
Glucose-Capillary: 141 mg/dL — ABNORMAL HIGH (ref 70–99)
Glucose-Capillary: 181 mg/dL — ABNORMAL HIGH (ref 70–99)
Glucose-Capillary: 184 mg/dL — ABNORMAL HIGH (ref 70–99)

## 2020-01-31 MED ORDER — CHLORHEXIDINE GLUCONATE CLOTH 2 % EX PADS
6.0000 | MEDICATED_PAD | Freq: Every day | CUTANEOUS | Status: DC
  Administered 2020-02-01 – 2020-02-07 (×7): 6 via TOPICAL

## 2020-01-31 NOTE — Progress Notes (Signed)
Occupational Therapy Session Note  Patient Details  Name: [name] MRN: [id number] Date of Birth: 1965/04/30  Today's Date: 01/31/2020 OT Individual Time: 1300-1400 OT Individual Time Calculation (min): 60 min    Short Term Goals: Week 1:  OT Short Term Goal 1 (Week 1): Pt will transfer with 1 assist in prep for toilet transfer and to decrease BOC OT Short Term Goal 2 (Week 1): Pt will perform LB dressing with Max A of 1 OT Short Term Goal 3 (Week 1): Pt will perform LB bathing with AE PRN with Mod A OT Short Term Goal 4 (Week 1): Pt will perform ADL activity for 10 mins without fatigue  Skilled Therapeutic Interventions/Progress Updates:    Treatment session with focus on functional transfers, core strength, lateral leans, and overall endurance.  Pt received semi-reclined in bed agreeable to therapy session.  Pt completed bed mobility supervision with hospital bed features.  Pt able to place slide board this session requiring min cues for proper placement.  Pt completed slide board transfers CGA throughout session.  Engaged in [place] strengthening on yoga blocks 2 x5 with focus on increased clearance of buttocks as needed for skin integrity during transfers.  Engaged in lateral leans as needed for clothing management, progressing to simulated LB dressing with use of theraband as waistband of pants. Pt able to complete x3 with increased time and effort.  Engaged in [place] and endurance as well as core strengthening and balance reactions while using rebounder.  Progressed from standard ball to 1kg ball with good strength and balance reactions.  Returned to room and remained upright in w/c with all needs in reach.  Therapy Documentation Precautions:  Precautions Precautions: Fall Precaution Comments: new L BKA Required Braces or Orthoses: Other Brace Other Brace: L LE limb guard Restrictions Weight Bearing Restrictions: Yes LLE Weight Bearing: Non weight bearing General:    Vital Signs: Therapy Vitals Temp: 99 F (37.2 C) Pulse Rate: 65 Resp: 17 BP: (!) 135/53 Patient Position (if appropriate): Sitting Oxygen Therapy SpO2: 96 % O2 Device: Room Air Pain: Pain Assessment Pain Scale: 0-10 Pain Score: 10-Worst pain ever Pain Type: Surgical pain Pain Location: Hip Pain Orientation: Left Pain Radiating Towards:  (hip) Pain Descriptors / Indicators: Sharp;Shooting Pain Frequency: Constant Pain Intervention(s): Medication (See eMAR)   Therapy/Group: Individual Therapy  Simonne Come 01/31/2020, 3:06 PM

## 2020-01-31 NOTE — Patient Care Conference (Signed)
Inpatient RehabilitationTeam Conference and Plan of Care Update Date: 01/31/2020   Time: 11:30 AM    Patient Name: [name]      Medical Record Number: [id number]  Date of Birth: 1966/01/22 Sex: Female         Room/Bed: 4M01C/4M01C-01 Payor Info: Payor:  [employer] / Plan: [place] / Product Type: *No Product type* /    Admit Date/Time:  01/23/2020  3:45 PM  Primary Diagnosis:  Below-knee amputation of left lower extremity [facility])  Hospital Problems: Principal Problem:   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[place]) Active Problems:   ESRD on dialysis ([place])   Sleep disturbance   Postoperative pain   Controlled type 1 diabetes mellitus with hyperglycemia ([place])   Leukocytosis   Essential hypertension   Bacteremia   Hypoglycemia    Expected Discharge Date: Expected Discharge Date: 02/21/20  Team Members Present: Physician leading conference: Dr. Courtney Heys Care Coodinator Present: Dorthula Nettles, RN, BSN, CRRN;Becky Dupree, LCSW Nurse Present: Other (comment) Barnabas Lister, RN) PT Present: Stacy Gardner, PT OT Present: Lillia Corporal, OT PPS Coordinator present : Ileana Ladd, Burna Mortimer, SLP     Current Status/Progress Goal Weekly Team Focus  Bowel/Bladder   continent of B/B. Last BM-11/28.HD- M-W-F  maintain continence  assess  q shift and PRN, monitor I&O   Swallow/Nutrition/ Hydration             ADL's   slide board Min A (pending hip pain), LB dress Max of 1 at bed level, UB ADLs CGA/CS, LB bathing at bed level Max A  Min-Mod for LB, Min for transfers  core strength, squat pivot transfers, LB ADLs with AE, lateral leans, UB strength, sit to stands   Mobility   min A to S for bed mobility, sit to stand mod +2 A, SBT min to mod A, w/c mobility S  mod I slide board transfer, min A sit to stand, min A short distance ambulation, mod I w/c mobility  transfers, LE strength, w/c mobility, sitting balance, standing  tolerance/balance   Communication             Safety/Cognition/ Behavioral Observations            Pain   pain level 9of 10 to L hip, controlled with oxycodone 15 mg and Flexeril  pain goal 2 of 10  assess pain q shift and PRN   Skin   L BKA, L hip surgical site, R chest HD port, L FA -HD fistula, not in use  healing of surgical sites.  asses skin  Q shift and PRN, Dressing to surgical sites per order.     Discharge Planning:  Home with daughter who is there some in am and then in the evenings, but pt will be alone while daughter works   Team Discussion: Continent bowel, HD MWF, oliguric, IV antibiotics 4 weeks. OT reports patient has improved within the last week, min assist slideboard to W/C, not progressed to standing, pain is more tolerated. PT reports patient is squat pivot and a heavy max assist. Needs assist to place board. Going home with daughter.  Patient on target to meet rehab goals: yes  *See Care Plan and progress notes for long and short-term goals.   Revisions to Treatment Plan:  MD reports left hip swollen, order placed to remove sutures. Calling vascular on Thursday to see if sutures can be removed from left BKA.  Teaching Needs: Continue with family education,  wound care teaching needs.  Current Barriers to Discharge: Decreased caregiver support, Home enviroment access/layout, Wound care, Lack of/limited family support, Hemodialysis and Weight bearing restrictions  Possible Resolutions to Barriers: Continue current medications, educate weight bearing precautions, educate wound care and dressing changes, provide emotional support to patient and family.     Medical Summary Current Status: off novolog; oliguric; continent Bowel; dressing changes qday; stutures out L hip today. pain is better controlled- DMI- pain more manageable for therapy  Barriers to Discharge: Decreased family/caregiver support;Home enviroment access/layout;IV antibiotics;Hemodialysis;Weight  bearing restrictions;Weight;Wound care  Barriers to Discharge Comments: going home with daughter- home part time; using transfer board min-mod A ; pain increases with lateral leans Possible Resolutions to [employer] Focus: mod A of 2 sit-stand; will ask Vascular about L BKA sutures-  12/21 d/c off HD day   Continued Need for Acute Rehabilitation Level of Care: The patient requires daily medical management by a physician with specialized training in physical medicine and rehabilitation for the following reasons: Direction of a multidisciplinary physical rehabilitation program to maximize functional independence : Yes Medical management of patient stability for increased activity during participation in an intensive rehabilitation regime.: Yes Analysis of laboratory values and/or radiology reports with any subsequent need for medication adjustment and/or medical intervention. : Yes   I attest that I was present, lead the team conference, and concur with the assessment and plan of the team.   Cristi Loron 01/31/2020, 2:52 PM

## 2020-01-31 NOTE — Progress Notes (Signed)
Occupational Therapy Session Note  Patient Details  Name: [name] MRN: [id number] Date of Birth: [birth year]  Today's Date: 01/31/2020 OT Individual Time: 4599-7741 OT Individual Time Calculation (min): 55 min    Short Term Goals: Week 1:  OT Short Term Goal 1 (Week 1): Pt will transfer with 1 assist in prep for toilet transfer and to decrease BOC OT Short Term Goal 2 (Week 1): Pt will perform LB dressing with Max A of 1 OT Short Term Goal 3 (Week 1): Pt will perform LB bathing with AE PRN with Mod A OT Short Term Goal 4 (Week 1): Pt will perform ADL activity for 10 mins without fatigue   Skilled Therapeutic Interventions/Progress Updates:    Pt greeted at time of session reclined in bed resting, just finished eating breakfast and agreeable to OT session. Premedicated for pain, and no pain initially but did have discomfort later in session with some mobility what she calls "spasms" in L hip area but no number given and rest breaks PRN. MD present at beginning of session, saying incision sites are looking good. Pt did not need to wash up today but did want to change brief, doffed pants at bed level Min A to get off ankles but pt able to doff rolling L/R at bed level and partial bridge pushing through RLE. Dependent brief  Change at bed level with assist to wash buttocks but pt able to wash periarea in front with use of bed features for long sitting. Donned pants back on with Min A at bed level with use of HOB elevation and reacher, pt able to don over hips rolling L and R and reacher to thread. Donned shrinker for the pt for sake of time, as well as discussion for DC home recommendations and need for dimensions for bathroom/doorways when daughter [name] is moved into new rental home. Supine to sit EOB with Supervision with bed rail and sat EOB Supervision for oral hygiene with set up. Scooting toward [facility] with Mod A, returned to supine Supervision and alarm on, call bell in reach.     Therapy Documentation Precautions:  Precautions Precautions: Fall Precaution Comments: new L BKA Required Braces or Orthoses: Other Brace Other Brace: L LE limb guard Restrictions Weight Bearing Restrictions: Yes LLE Weight Bearing: Non weight bearing   Therapy/Group: Individual Therapy  [name] 01/31/2020, 8:22 AM

## 2020-01-31 NOTE — Progress Notes (Signed)
Physical Therapy Session Note  Patient Details  Name: [name] MRN: [id number] Date of Birth: [birth year]  Today's Date: 01/31/2020 PT Individual Time: 1002-1051 PT Individual Time Calculation (min): 49 min   Short Term Goals: Week 1:  PT Short Term Goal 1 (Week 1): Patient will perform bed mobility supine to sit with min A; sit to supine with mod A PT Short Term Goal 2 (Week 1): Patient will perform bed to chair transfers with slide board and mod A PT Short Term Goal 3 (Week 1): Patient to propel w/c with S x 100' PT Short Term Goal 4 (Week 1): patient to perform sit to stand with max A of 1  Skilled Therapeutic Interventions/Progress Updates:   Pt received supine in bed and agreeable to therapy session. Reports L hip pain 8/10 - RN notified and present for medication administration - therapist provided seated rest breaks, repositioning, distraction, and emotional support for pain management throughout session. In supine, donned L LE limb protector with mod assist - cuing for proper alignment. Supine>sitting L EOB, HOB flat but using bedrails, with supervision and increased time. R lateral scoot transfer EOB>w/c using transfer board with supervision and assist for stabilizing w/c - min cuing needed for proper lateral trunk lean to unweight hip and place board without physical assist - pt demos ability to scoot with supervision but lacks ability to lift hips resulting in increased shearing forces, educated pt on importance of this. 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 Sit>stand w/c>B UE support on // bars with heavy mod assist for lifting into standing.  Tolerated standing ~2-73minutes each time in // bars focusing on the following: - R/L hand taps onto therapist's shoulder focusing on balance with decreased UE support - R LE ankle PF with progression to attempts at  hopping with pt able to take 1 very small hop 1x but then unable to perform slight hop backwards - pt becomes quickly fatigued with attempts at hopping Transported back to room in w/c and pt requesting for assistance back to bed. R lateral scoot transfer w/c>EOB with set-up assist for w/c positioning then supervision throughout - continued min cuing for proper head/hips relationship to allow improved ability to place transfer board without assist - performed scoot with close supervision. Doffed limb guard easily as it was starting to slide off while standing. Sit>supine with supervision using bedrails as needed. Pt left supine in bed with needs in reach and bed alarm on.   Therapy Documentation Precautions:  Precautions Precautions: Fall Precaution Comments: new L BKA Required Braces or Orthoses: Other Brace Other Brace: L LE limb guard Restrictions Weight Bearing Restrictions: Yes LLE Weight Bearing: Non weight bearing  Therapy/Group: Individual Therapy  Tawana Scale , PT, DPT, CSRS  01/31/2020, 8:00 AM

## 2020-01-31 NOTE — Progress Notes (Signed)
[place] PHYSICAL MEDICINE & REHABILITATION PROGRESS NOTE   Subjective/Complaints:  Pt reports LBM was yesterday- taking pain meds q4 hours prn- when pain gets out of control, gets 9/10-   ROS:   Pt denies SOB, abd pain, CP, N/V/C/D, and vision changes   Objective:   No results found. Recent Labs    01/30/20 1343  WBC 10.3  HGB 9.2*  HCT 30.2*  PLT 302   Recent Labs    01/30/20 1343  NA 129*  K 4.4  CL 93*  CO2 25  GLUCOSE 207*  BUN 55*  CREATININE 7.35*  CALCIUM 8.0*    Intake/Output Summary (Last 24 hours) at 01/31/2020 0842 Last data filed at 01/31/2020 0300 Gross per 24 hour  Intake 323.6 ml  Output 3126 ml  Net -2802.4 ml     Pressure Injury 01/23/20 Buttocks Left Deep Tissue Pressure Injury - Purple or maroon localized area of discolored intact skin or blood-filled blister due to damage of underlying soft tissue from pressure and/or shear. Circular, dark, red area, surrounde (Active)  01/23/20 1609  Location: Buttocks  Location Orientation: Left  Staging: Deep Tissue Pressure Injury - Purple or maroon localized area of discolored intact skin or blood-filled blister due to damage of underlying soft tissue from pressure and/or shear.  Wound Description (Comments): Circular, dark, red area, surrounded by MASD  Present on Admission: Yes    Physical Exam: Vital Signs Blood pressure (!) 141/47, pulse 64, temperature 98.8 F (37.1 C), temperature source Oral, resp. rate 18, height 5\' 10"  (1.778 m), weight 93.5 kg, SpO2 97 %. Constitutional: sitting up slightly in bed; OT at bedside again, NAD HENT: Normocephalic.  Atraumatic. Eyes: EOMI. No discharge. Cardiovascular: RRR Respiratory: CTA B/L- no W/R/R- good air movement GI: Soft, NT, ND, (+)BS  Skin: Warm and dry.  L BKA-  No drainage, no erythema- dog ears better- shaping well. L hip looks great- sutures in place- no swelling/erythema Psych: appropriate Musc: Left BKA with edema and  tenderness Right lower extremity edema, stable Neuro: Alert Motor: Right lower extremity: Hip flexion, knee extension 4-5/5 (pain inhibition), ankle dorsiflexion 4+/5, unchanged Left lower extremity: Hip flexion, knee extension 3/5 (some pain inhibition), ankle dorsiflexion 4/5   Assessment/Plan: 1. Functional deficits which require 3+ hours per day of interdisciplinary therapy in a comprehensive inpatient rehab setting.  Physiatrist is providing close team supervision and 24 hour management of active medical problems listed below.  Physiatrist and rehab team continue to assess barriers to discharge/monitor patient progress toward functional and medical goals  Care Tool:  Bathing    Body parts bathed by patient: Right arm, Left arm, Chest, Abdomen, Front perineal area, Face   Body parts bathed by helper: Buttocks, Right lower leg Body parts n/a: Left lower leg   Bathing assist Assist Level: Contact Guard/Touching assist (UB bathing only, would have needed more assist with LB)     Upper Body Dressing/Undressing Upper body dressing   What is the patient wearing?: Pull over shirt    Upper body assist Assist Level: Contact Guard/Touching assist    Lower Body Dressing/Undressing Lower body dressing      What is the patient wearing?: Pants     Lower body assist Assist for lower body dressing: Minimal Assistance - Patient > 75% (bed level with reacher and use of bed features)     Toileting Toileting Toileting Activity did not occur (Clothing management and hygiene only): N/A (no void or bm)  Toileting assist Assist for toileting:  Independent with assistive device Assistive Device Comment: female urinal   Transfers Chair/bed transfer  Transfers assist  Chair/bed transfer activity did not occur: Safety/medical concerns  Chair/bed transfer assist level: Maximal Assistance - Patient 25 - 49%     Locomotion Ambulation   Ambulation assist   Ambulation activity did  not occur: Safety/medical concerns          Walk 10 feet activity   Assist  Walk 10 feet activity did not occur: Safety/medical concerns        Walk 50 feet activity   Assist Walk 50 feet with 2 turns activity did not occur: Safety/medical concerns         Walk 150 feet activity   Assist Walk 150 feet activity did not occur: Safety/medical concerns         Walk 10 feet on uneven surface  activity   Assist Walk 10 feet on uneven surfaces activity did not occur: Safety/medical concerns         Wheelchair     Assist Will patient use wheelchair at discharge?: Yes Type of Wheelchair: Manual    Wheelchair assist level: Supervision/Verbal cueing Max wheelchair distance: 180'    Wheelchair 50 feet with 2 turns activity    Assist        Assist Level: Supervision/Verbal cueing   Wheelchair 150 feet activity     Assist      Assist Level: Supervision/Verbal cueing, Contact Guard/Touching assist   Medical Problem List and Plan: 1.  Impaired mobility and ADLs secondary to L BKA  Continue CIR 2.  Antithrombotics: -DVT/anticoagulation:  Pharmaceutical: Lovenox             -antiplatelet therapy: N/A 3. Chronic pain/Pain Management: Was on oxycodone 5 mg tid prn as well Cymbalta for neuropathy (at baseline)  11/30- pain better controlled with q4 hours prn pain meds 4. Mood: Team to provide ego support/encouragement.              -antipsychotic agents: N/A 5. Neuropsych: This patient is capable of making decisions on her own behalf. 6. Skin/Wound Care: Monitor wound daily   Continue stump shrinker   11/30- will call Vascular on Thursday about getting sutures out of L BKA- has been 4 weeks.  7. Fluids/Electrolytes/Nutrition: Strict I/O. 1200 cc FR. Will consult dietician to educate on renal diet.  8. Left hip ORIF/hip abscess s/p I&D 11/18: NWB LLE.   Flagyl changed to Ertepenem due to patient refusal, continue  Continue cefepime 9. 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 T1DM with neuropathy: Hgb A1c-6.2. Was on Lantus 50 units in am with 10 units novolog tid.     Lantus 38 twice daily, decreased to 10 on 11/27, increased to 15 on 11/28  NovoLog 5 units with breakfast, 8 units with lunch and dinner, decreased to 5 3 times daily on 11/27, DC'd on 11/27.  Monitor BS ac/hs and use SSI for elevated  BS.   Significantly labile, with severe symptomatic hypoglycemia  11/29- BGs 129-230 off Novolog with meals- will con't for now.   11/30- BGs 160s-245- only 1 BG >200- won't change for now 11. Now ESRD: Now on HD MWF (set up at [place] post discharge)  12. Anemia of chronic disease/iron deficiency: Hgb-6.3 at admission--->9.1 s/p 4 units PRBC. Feraheme X 1 (additional dose held due to infection) and Aranesp weekly.   Hemoglobin 9.7 on 11/26  Continue to monitor 13. HTN:  Monitor BP tid--continue Coreg bid, Norvasc and apresoline tid.   Decreased Coreg to 12.5mg  BID.   Controlled on 11/28 14. IBS-D:   MiraLAX DC'd  Resumed Lotrenex    Colace as needed ordered  11/30- LBM yesterday- con't regimen 15. Malnutrition: Continue protein supplement.  16. Abdominal pain: Due to diarrhea? PPI resumed-->patient to have family bring in [place] from home.  17.  Sleep disturbance:   Low dose elavil at bedtime.  Melatonin added on 11/26  Improving overall 18. Leukocytosis:   WBCs 11.6 on 11/26, labs with HD  See #8, #9  Afebrile  11/30- WBC down to 10.3- con't to monitor  LOS: 8 days A FACE TO FACE EVALUATION WAS PERFORMED  Jeanmarie Mccowen 01/31/2020, 8:42 AM

## 2020-01-31 NOTE — Progress Notes (Signed)
Writer was able to remove some of sutures this shift after medicating patient and patient tolerated well until pain started and spasm started.  Probation officer per patient request stopped. Will pass removal of sutures to oncoming nurses. Will continue to monitor,

## 2020-01-31 NOTE — Progress Notes (Signed)
[facility] Progress Note   Subjective:   Patient seen and examined in room.  Tired, just returned from PT.  Overall doing ok.  Reports some twitching.  Denies SOB, CP, n/v/d, abdominal pain, fever and chills.  Tolerating dialysis well.   Objective Vitals:   01/30/20 1801 01/30/20 1815 01/30/20 1923 01/31/20 0520  BP: (!) 137/57 (!) 123/50 (!) 135/45 (!) 141/47  Pulse: 65 65 67 64  Resp: _0 Temp: 98.4 F (36.9 C) 98.4 F (36.9 C) 98.5 F (36.9 C) 98.8 F (37.1 C)  TempSrc: Oral   Oral  SpO2: 94% 94% 96% 97%  Weight: 90 kg   93.5 kg  Height:       Physical Exam General:WDWN female in NAD, sitting in wheelchair Heart:RRR Lungs:CTAB Abdomen:soft, NTND Extremities:1+ RLE edema, L BKA wrapped Dialysis Access: LU AVF +b/t, R IJ [facility]   Filed Weights   01/30/20 1344 01/30/20 1801 01/31/20 0520  Weight: 93 kg 90 kg 93.5 kg    Intake/Output Summary (Last 24 hours) at 01/31/2020 1443 Last data filed at 01/31/2020 0830 Gross per 24 hour  Intake 463.6 ml  Output 3126 ml  Net -2662.4 ml    Additional Objective Labs: Basic Metabolic Panel: Recent Labs  Lab 01/27/20 0441 01/27/20 1444 01/30/20 1343  NA 129* 127* 129*  K 4.4 4.3 4.4  CL 92* 91* 93*  CO2 _1 GLUCOSE 90 111* 207*  BUN 46* 51* 55*  CREATININE 6.67* 7.03* 7.35*  CALCIUM 8.9 8.6* 8.0*  PHOS  --  5.0* 5.7*   Liver Function Tests: Recent Labs  Lab 01/27/20 1444 01/30/20 1343  ALBUMIN 1.9* 1.9*  CBC: Recent Labs  Lab 01/27/20 0441 01/27/20 1421 01/30/20 1343  WBC 10.6* 11.6* 10.3  NEUTROABS 6.5  --   --   HGB 10.0* 9.7* 9.2*  HCT 32.7* 31.6* 30.2*  MCV 91.3 91.3 90.7  PLT 294 311 302   Blood Culture    Component Value Date/Time   SDES ABSCESS LEFT HIP 01/19/2020 1424   SPECREQUEST PT ON ANCEF 01/19/2020 1424   CULT  01/19/2020 1424    RARE ENTEROBACTER CLOACAE MODERATE BACTEROIDES FRAGILIS BETA LACTAMASE POSITIVE Performed at [facility], [address] [place], [place] [address]    REPTSTATUS 01/22/2020 FINAL 01/19/2020 1424    CBG: Recent Labs  Lab 01/30/20 1126 01/30/20 1811 01/30/20 2100 01/31/20 0623 01/31/20 1128  GLUCAP 188* 177* 245* 170* 141*    Medications: . sodium chloride    . sodium chloride    . sodium chloride Stopped (01/30/20 2014)  . ertapenem Stopped (01/30/20 1946)   . (feeding supplement) PROSource Plus  30 mL Oral BID BM  . alosetron  0.5 mg Oral BID  . amitriptyline  10 mg Oral QHS  . amLODipine  10 mg Oral Daily  . atorvastatin  40 mg Oral q1800  . carvedilol  12.5 mg Oral BID WC  . Chlorhexidine Gluconate Cloth  6 each Topical Q0600  . darbepoetin (ARANESP) injection - DIALYSIS  100 mcg Intravenous Q Mon-HD  . DULoxetine  60 mg Oral Daily  . enoxaparin (LOVENOX) injection  30 mg Subcutaneous Q24H  . hydrALAZINE  100 mg Oral TID  . insulin aspart  0-5 Units Subcutaneous QHS  . insulin aspart  0-9 Units Subcutaneous TID WC  . insulin glargine  15 Units Subcutaneous QHS  . melatonin  1.5 mg Oral QHS  . multivitamin  1 tablet Oral  QHS  . pantoprazole  40 mg Oral Daily  . sevelamer carbonate  800 mg Oral TID WC    Dialysis Orders: new start  Assessment/Plan: 1. AoCKD 5,now ESRD: Baseline Creat 4.9- 5.2, eGFR 9-11 from 8/21 - 10/21. Creat 6 on admission, then mid 7's, IVF's given w/o improvementso started HD. Has been clipped to [facility]. S/p [facility] and AVF on11/8, 1st HD 11/9. Continue MWF schedule-next HD 11/31.  Last K 4.4.   2. Anemiaof ESRD- Hgb6.3 on admit. S/p 4U PRBCs.Tsat low 10%, got 534m Feraheme on 11/6, holding further doses due to infection. IncreasedAranesp 60 to 100 q week on 11/17.Last Hgb 9.2, if continues trending down will need to increase ESA.  3. 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 L hip fracture - admission diagnosis.S/p ORIF on 10/28 per ortho.S/p I&D surgical wound 11/18.Wasrefusing Flagyl, now looks like  on Ertapenem. 5. HTN/ vol - BP's are stable, on hydralazine, amlodipine, carvedilol was decreased then stopped due to bradycardia, but has now been restarted. With hyponatremia and edema,increasing UF goals with HDand reviewed fluid restrictions. Continue to UF as tolerated.   6. DM - on insulin, per pmd 7. BMM -CorrCa high initially - now improved. No VDRA.Started binder this admission and phos at goal now. PTH 44.  8. Nutrition: albumin very low, on protein supplement.Renal diet w/fluid restrictions.   LJen Mow PA-C [facility] 01/31/2020,2:43 PM  LOS: 8 days

## 2020-01-31 NOTE — Progress Notes (Signed)
Patient ID: [name], female   DOB: 10-24-1965, 54 y.o.   MRN: [id number]  Met with pt to discuss team conference progress toward her goals of mod/i with transfers and min/mod for bathing and dressing. She has just started to get moe upper body strength and has not stood yet. Target discharge date is 12/21. Pt felt this was long discussed if reaches goals sooner can move up date and how independent she needs to be while here daughter is working. She realizes this and is agreeable. Made aware she may not get home health due to her medicaid coverage, so needs to be at a manageable level at discharge. Will continue to follow and provide support to pt.

## 2020-01-31 NOTE — Progress Notes (Addendum)
Physical Therapy Session Note  Patient Details  Name: [name] MRN: [id number] Date of Birth: [birth year]  Today's Date: 01/31/2020 PT Individual Time: 1445-1526 PT Individual Time Calculation (min): 41 min   Short Term Goals: Week 1:  PT Short Term Goal 1 (Week 1): Patient will perform bed mobility supine to sit with min A; sit to supine with mod A PT Short Term Goal 2 (Week 1): Patient will perform bed to chair transfers with slide board and mod A PT Short Term Goal 3 (Week 1): Patient to propel w/c with S x 100' PT Short Term Goal 4 (Week 1): patient to perform sit to stand with max A of 1  Skilled Therapeutic Interventions/Progress Updates:    pt received in [facility] and agreeable to therapy however reported 10+/10 pain per pt. Nursing present at start of session to give medication. Pt reported she was unable to remain in [facility] at this time and had to lay down 2/2 pain. Pt directed in slide board transfer from [facility] to bedside, min A with PT placing board fully. With extra time and VC for technique pt able to complete at min A with lateral scooting. Pt then directed in supine>sit min A NCT brought x2 ice packs to room and pt placed on R hip. Pt directed in supine bed exercises of RLE leg lifts, hip abduction and adduction x15; B glute squeezes, L quad sets, leg lifts x10 and x15 ankle pumps on RLE. Pt reported improved comfort in bed and post exercises very apologetic but reports pain levels were too high to leave room in [facility] for PT session. Pt continues to be very motivated to participate within controlled pain. Pt left in bed, alarm set, All needs in reach and in good condition. Call light in hand.  Denied additional needs  Pt reported 9/10 pain at end of session.   Therapy Documentation Precautions:  Precautions Precautions: Fall Precaution Comments: new L BKA Required Braces or Orthoses: Other Brace Other Brace: L LE limb guard Restrictions Weight Bearing Restrictions: Yes LLE Weight  Bearing: Non weight bearing General:   Vital Signs: Therapy Vitals Temp: 99 F (37.2 C) Pulse Rate: 65 Resp: 17 BP: (!) 135/53 Patient Position (if appropriate): Sitting Oxygen Therapy SpO2: 96 % O2 Device: Room Air Pain: Pain Assessment Pain Scale: 0-10 Pain Score: 10-Worst pain ever Pain Type: Surgical pain Pain Location: Hip Pain Orientation: Left Pain Radiating Towards:  (hip) Pain Descriptors / Indicators: Sharp;Shooting Pain Frequency: Constant Pain Intervention(s): Medication (See eMAR) Mobility:   Locomotion :    Trunk/Postural Assessment :    Balance:   Exercises:   Other Treatments:      Therapy/Group: Individual Therapy  Junie Panning 01/31/2020, 3:39 PM

## 2020-02-01 ENCOUNTER — Inpatient Hospital Stay (HOSPITAL_COMMUNITY): Payer: Medicaid Other | Admitting: Occupational Therapy

## 2020-02-01 ENCOUNTER — Inpatient Hospital Stay (HOSPITAL_COMMUNITY): Payer: Medicaid Other

## 2020-02-01 ENCOUNTER — Inpatient Hospital Stay (HOSPITAL_COMMUNITY): Payer: Medicaid Other | Admitting: *Deleted

## 2020-02-01 DIAGNOSIS — S88112A Complete traumatic amputation at level between knee and ankle, left lower leg, initial encounter: Secondary | ICD-10-CM | POA: Diagnosis not present

## 2020-02-01 LAB — RENAL FUNCTION PANEL
Albumin: 2 g/dL — ABNORMAL LOW (ref 3.5–5.0)
Anion gap: 10 (ref 5–15)
BUN: 46 mg/dL — ABNORMAL HIGH (ref 6–20)
CO2: 26 mmol/L (ref 22–32)
Calcium: 8.5 mg/dL — ABNORMAL LOW (ref 8.9–10.3)
Chloride: 94 mmol/L — ABNORMAL LOW (ref 98–111)
Creatinine, Ser: 6.4 mg/dL — ABNORMAL HIGH (ref 0.44–1.00)
GFR, Estimated: 7 mL/min — ABNORMAL LOW (ref >60)
Glucose, Bld: 131 mg/dL — ABNORMAL HIGH (ref 70–99)
Phosphorus: 4.9 mg/dL — ABNORMAL HIGH (ref 2.5–4.6)
Potassium: 4.1 mmol/L (ref 3.5–5.1)
Sodium: 130 mmol/L — ABNORMAL LOW (ref 135–145)

## 2020-02-01 LAB — CBC
HCT: 30.9 % — ABNORMAL LOW (ref 36.0–46.0)
Hemoglobin: 9.5 g/dL — ABNORMAL LOW (ref 12.0–15.0)
MCH: 27.6 pg (ref 26.0–34.0)
MCHC: 30.7 g/dL (ref 30.0–36.0)
MCV: 89.8 fL (ref 80.0–100.0)
Platelets: 295 10*3/uL (ref 150–400)
RBC: 3.44 MIL/uL — ABNORMAL LOW (ref 3.87–5.11)
RDW: 14.9 % (ref 11.5–15.5)
WBC: 11.1 10*3/uL — ABNORMAL HIGH (ref 4.0–10.5)
nRBC: 0 % (ref 0.0–0.2)

## 2020-02-01 LAB — GLUCOSE, CAPILLARY
Glucose-Capillary: 172 mg/dL — ABNORMAL HIGH (ref 70–99)
Glucose-Capillary: 111 mg/dL — ABNORMAL HIGH (ref 70–99)
Glucose-Capillary: 132 mg/dL — ABNORMAL HIGH (ref 70–99)
Glucose-Capillary: 126 mg/dL — ABNORMAL HIGH (ref 70–99)

## 2020-02-01 MED ORDER — HEPARIN SODIUM (PORCINE) 1000 UNIT/ML DIALYSIS: 1000.0000 [IU] | INTRAMUSCULAR | Status: DC | PRN

## 2020-02-01 MED ORDER — TRAMADOL HCL 50 MG PO TABS
ORAL_TABLET | ORAL | Status: AC
  Administered 2020-02-01: 50 mg via ORAL
  Filled 2020-02-01: qty 1

## 2020-02-01 MED ORDER — PENTAFLUOROPROP-TETRAFLUOROETH EX AERO: 1.0000 "application " | INHALATION_SPRAY | CUTANEOUS | Status: DC | PRN

## 2020-02-01 MED ORDER — SODIUM CHLORIDE 0.9 % IV SOLN: 100.0000 mL | INTRAVENOUS | Status: DC | PRN

## 2020-02-01 MED ORDER — LIDOCAINE-PRILOCAINE 2.5-2.5 % EX CREA: 1.0000 "application " | TOPICAL_CREAM | CUTANEOUS | Status: DC | PRN

## 2020-02-01 MED ORDER — HEPARIN SODIUM (PORCINE) 1000 UNIT/ML IJ SOLN
INTRAMUSCULAR | Status: AC
  Administered 2020-02-01: 3200 [IU]
  Filled 2020-02-01: qty 3

## 2020-02-01 MED ORDER — ALTEPLASE 2 MG IJ SOLR: 2.0000 mg | Freq: Once | INTRAMUSCULAR | Status: DC | PRN

## 2020-02-01 MED ORDER — LIDOCAINE HCL (PF) 1 % IJ SOLN: 5.0000 mL | INTRAMUSCULAR | Status: DC | PRN

## 2020-02-01 NOTE — Progress Notes (Signed)
   02/01/20 1645  Hand-Off documentation  Handoff Given Given to shift RN/LPN  Report given to (Full Name) Patricia,RN  Handoff Received Received from Transfer Unit/facility  Report received from (Full Name) Rahmel Nedved,RN  Vitals  Temp 98.9 F (37.2 C)  Temp Source Oral  BP (!) 131/58  BP Location Right Arm  BP Method Automatic  Patient Position (if appropriate) Lying  Pulse Rate (!) 59  Pulse Rate Source Monitor  Resp 18  Oxygen Therapy  SpO2 96 %  O2 Device Room Air  Dialysis Weight  Weight 88.7 kg  Type of Weight Post-Dialysis  Post-Hemodialysis Assessment  Rinseback Volume (mL) 250 mL  KECN 216 V  Dialyzer Clearance Lightly streaked  Duration of HD Treatment -hour(s) 3.5 hour(s)  Hemodialysis Intake (mL) 500 mL  UF Total -Machine (mL) 4500 mL  Net UF (mL) 4000 mL  Tolerated HD Treatment Yes  Post-Hemodialysis Comments txcomplete-pt stable  Fistula / Graft Left Upper arm Arteriovenous fistula  Placement Date/Time: 01/09/20 1121   Placed prior to admission: No  Orientation: Left  Access Location: Upper arm  Access Type: Arteriovenous fistula  Site Condition No complications  Fistula / Graft Assessment Present;Bruit;Thrill  Status Other (Comment) (maturing)  Drainage Description None  Hemodialysis Catheter Right Internal jugular Double lumen Permanent (Tunneled)  Placement Date/Time: 01/09/20 1029   Placed prior to admission: No  Time Out: Correct patient;Correct site;Correct procedure  Maximum sterile barrier precautions: Hand hygiene;Cap;Mask;Large sterile sheet;Sterile gloves;Sterile gown  Site Prep: Chlorh...  Site Condition No complications  Blue Lumen Status Flushed;Capped (Central line);Heparin locked  Red Lumen Status Flushed;Capped (Central line);Heparin locked  Catheter fill solution Heparin 1000 units/ml  Catheter fill volume (Arterial) 1.6 cc  Catheter fill volume (Venous) 1.6  Dressing Type Gauze/Drain sponge  Dressing Status Clean;Dry;Intact   Antimicrobial disc in place? Yes  Drainage Description None  Dressing Change Due 02/06/20  Post treatment catheter status Capped and Clamped

## 2020-02-01 NOTE — Plan of Care (Signed)
  Problem: RH SKIN INTEGRITY Goal: RH STG SKIN FREE OF INFECTION/BREAKDOWN Description: With min assist Outcome: Progressing Goal: RH STG MAINTAIN SKIN INTEGRITY WITH ASSISTANCE Description: STG Maintain Skin Integrity With  min Assistance. Outcome: Progressing Goal: RH STG ABLE TO PERFORM INCISION/WOUND CARE W/ASSISTANCE Description: STG Able To Perform Incision/Wound Care With  min Assistance. Outcome: Progressing   Problem: RH SAFETY Goal: RH STG ADHERE TO SAFETY PRECAUTIONS W/ASSISTANCE/DEVICE Description: STG Adhere to Safety Precautions With cues/reminders Assistance/Device. Outcome: Progressing

## 2020-02-01 NOTE — Progress Notes (Signed)
[facility] Progress Note   Subjective: Seen on HD via [facility]. High goal, tolerating without issues.     Objective Vitals:   02/01/20 1400 02/01/20 1430 02/01/20 1500 02/01/20 1530  BP: 134/64 110/79 130/62 (!) 111/57  Pulse: 63 62 61 60  Resp:      Temp:      TempSrc:      SpO2:      Weight:      Height:       Physical Exam General: Pleasant female in NAD Heart: S1,S2 RRR Lungs: CTAB Abdomen: S, NT Extremities: L BKA. Trace-1+ RLE edema Dialysis Access: RIJ TDC Drsg CDI. Maturing L AVF + T/B.    Additional Objective Labs: Basic Metabolic Panel: Recent Labs  Lab 01/27/20 1444 01/30/20 1343 02/01/20 1331  NA 127* 129* 130*  K 4.3 4.4 4.1  CL 91* 93* 94*  CO2 26 25 26  GLUCOSE 111* 207* 131*  BUN 51* 55* 46*  CREATININE 7.03* 7.35* 6.40*  CALCIUM 8.6* 8.0* 8.5*  PHOS 5.0* 5.7* 4.9*   Liver Function Tests: Recent Labs  Lab 01/27/20 1444 01/30/20 1343 02/01/20 1331  ALBUMIN 1.9* 1.9* 2.0*   No results for input(s): LIPASE, AMYLASE in the last 168 hours. CBC: Recent Labs  Lab 01/27/20 0441 01/27/20 0441 01/27/20 1421 01/30/20 1343 02/01/20 1332  WBC 10.6*   < > 11.6* 10.3 11.1*  NEUTROABS 6.5  --   --   --   --   HGB 10.0*   < > 9.7* 9.2* 9.5*  HCT 32.7*   < > 31.6* 30.2* 30.9*  MCV 91.3  --  91.3 90.7 89.8  PLT 294   < > 311 302 295   < > = values in this interval not displayed.   Blood Culture    Component Value Date/Time   SDES ABSCESS LEFT HIP 01/19/2020 1424   SPECREQUEST PT ON ANCEF 01/19/2020 1424   CULT  01/19/2020 1424    RARE ENTEROBACTER CLOACAE MODERATE BACTEROIDES FRAGILIS BETA LACTAMASE POSITIVE Performed at [facility], [address] [place], [place] [address]    REPTSTATUS 01/22/2020 FINAL 01/19/2020 1424    Cardiac Enzymes: No results for input(s): CKTOTAL, CKMB, CKMBINDEX, TROPONINI in the last 168 hours. CBG: Recent Labs  Lab 01/31/20 1128 01/31/20 1616 01/31/20 2104 02/01/20 0647  02/01/20 1159  GLUCAP 141* 181* 184* 172* 111*   Iron Studies: No results for input(s): IRON, TIBC, TRANSFERRIN, FERRITIN in the last 72 hours. @lablastinr3@ Studies/Results: No results found. Medications: . sodium chloride    . sodium chloride    . sodium chloride Stopped (01/30/20 2014)  . sodium chloride    . sodium chloride    . ertapenem 500 mg (01/31/20 1731)   . (feeding supplement) PROSource Plus  30 mL Oral BID BM  . alosetron  0.5 mg Oral BID  . amitriptyline  10 mg Oral QHS  . amLODipine  10 mg Oral Daily  . atorvastatin  40 mg Oral q1800  . carvedilol  12.5 mg Oral BID WC  . Chlorhexidine Gluconate Cloth  6 each Topical Q0600  . darbepoetin (ARANESP) injection - DIALYSIS  100 mcg Intravenous Q Mon-HD  . DULoxetine  60 mg Oral Daily  . enoxaparin (LOVENOX) injection  30 mg Subcutaneous Q24H  . heparin sodium (porcine)      . hydrALAZINE  100 mg Oral TID  . insulin aspart  0-5 Units Subcutaneous QHS  . insulin aspart  0-9 Units Subcutaneous TID   WC  . insulin glargine  15 Units Subcutaneous QHS  . melatonin  1.5 mg Oral QHS  . multivitamin  1 tablet Oral QHS  . pantoprazole  40 mg Oral Daily  . sevelamer carbonate  800 mg Oral TID WC     Dialysis Orders: new start  Assessment/Plan: 1. AoCKD 5,now ESRD: Baseline Creat 4.9- 5.2, eGFR 9-11 from 8/21 - 10/21. Creat 6 on admission, then mid 7's, IVF's given w/o improvementso started HD.Has been clipped to [facility].S/p [facility] and AVF on11/8, 1st HD 11/9. Continue MWF schedule-HD today on schedule.   2. Anemiaof ESRD- Hgb6.3 on admit. S/p 4U PRBCs.Tsat low 10%, got 58m Feraheme on 11/6, holding further doses due to infection. IncreasedAranesp 60 to 100 q week on 11/17.Last Hgb 9.5. 3. 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 L hip fracture - admission diagnosis.S/p ORIF on 10/28 per ortho.S/p I&D surgical wound 11/18.Wasrefusing Flagyl, now looks like on  Ertapenem. 5. HTN/ vol - BP's are stable, on hydralazine, amlodipine, carvedilol was decreased then stopped due to bradycardia, but has now been restarted. With hyponatremia and edema,increasing UF goals with HDand reviewed fluid restrictions. Continue to UF as tolerated.   6. DM - on insulin, per pmd 7. BMM -CorrCa high initially - now improved. No VDRA.Started binder this admission and phos at goal now. PTH 44.  8. Nutrition: albumin very low, on protein supplement.Renal diet w/fluid restrictions.   Sahory Nordling H. Shenouda Genova NP-C 02/01/2020, 4:05 PM  CNewell [phone]

## 2020-02-01 NOTE — Progress Notes (Signed)
[facility] PHYSICAL MEDICINE & REHABILITATION PROGRESS NOTE   Subjective/Complaints:  Pt taking Oxycodone 10-15 mg q4 hours prn- we discussed going up on oxycodone as needed, but already at 10-15 mg q4 hours prn.   LBM 2 days ago, but noted this is normal at home- will wait til tomorrow for giving bowel meds to go.   ROS:   Pt denies SOB, abd pain, CP, N/V/C/D, and vision changes   Objective:   No results found. Recent Labs    01/30/20 1343  WBC 10.3  HGB 9.2*  HCT 30.2*  PLT 302   Recent Labs    01/30/20 1343  NA 129*  K 4.4  CL 93*  CO2 25  GLUCOSE 207*  BUN 55*  CREATININE 7.35*  CALCIUM 8.0*    Intake/Output Summary (Last 24 hours) at 02/01/2020 8469 Last data filed at 02/01/2020 0715 Gross per 24 hour  Intake 862 ml  Output --  Net 862 ml     Pressure Injury 01/23/20 Buttocks Left Deep Tissue Pressure Injury - Purple or maroon localized area of discolored intact skin or blood-filled blister due to damage of underlying soft tissue from pressure and/or shear. Circular, dark, red area, surrounde (Active)  01/23/20 1609  Location: Buttocks  Location Orientation: Left  Staging: Deep Tissue Pressure Injury - Purple or maroon localized area of discolored intact skin or blood-filled blister due to damage of underlying soft tissue from pressure and/or shear.  Wound Description (Comments): Circular, dark, red area, surrounded by MASD  Present on Admission: Yes    Physical Exam: Vital Signs Blood pressure (!) 127/48, pulse 61, temperature 99 F (37.2 C), temperature source Oral, resp. rate 18, height 5\' 10"  (1.778 m), weight 92.7 kg, SpO2 97 %. Constitutional: laying supine in bed; appropriate, NAD HENT: Normocephalic.  Atraumatic. Eyes: EOMI. No discharge. Cardiovascular: RRR Respiratory: CTA B/L- no W/R/R- good air movement GI: Soft, NT, ND, (+)BS   Skin: Warm and dry.  L BKA-  No drainage, no erythema- dog ears better- shaping well. L hip looks great-  sutures in place- no swelling/erythema Psych: flat affect, but appropriate behavior Musc: Left BKA with edema and tenderness Right lower extremity edema, stable Neuro: Alert Motor: Right lower extremity: Hip flexion, knee extension 4-5/5 (pain inhibition), ankle dorsiflexion 4+/5, unchanged Left lower extremity: Hip flexion, knee extension 3/5 (some pain inhibition), ankle dorsiflexion 4/5   Assessment/Plan: 1. Functional deficits which require 3+ hours per day of interdisciplinary therapy in a comprehensive inpatient rehab setting.  Physiatrist is providing close team supervision and 24 hour management of active medical problems listed below.  Physiatrist and rehab team continue to assess barriers to discharge/monitor patient progress toward functional and medical goals  Care Tool:  Bathing    Body parts bathed by patient: Right arm, Left arm, Chest, Abdomen, Front perineal area, Face   Body parts bathed by helper: Buttocks, Right lower leg Body parts n/a: Left lower leg   Bathing assist Assist Level: Contact Guard/Touching assist (UB bathing only, would have needed more assist with LB)     Upper Body Dressing/Undressing Upper body dressing   What is the patient wearing?: Pull over shirt    Upper body assist Assist Level: Contact Guard/Touching assist    Lower Body Dressing/Undressing Lower body dressing      What is the patient wearing?: Pants     Lower body assist Assist for lower body dressing: Minimal Assistance - Patient > 75% (bed level with reacher and use of bed  features)     Toileting Toileting Toileting Activity did not occur Landscape architect and hygiene only): N/A (no void or bm)  Toileting assist Assist for toileting: Independent with assistive device Assistive Device Comment: female urinal   Transfers Chair/bed transfer  Transfers assist  Chair/bed transfer activity did not occur: Safety/medical concerns  Chair/bed transfer assist level:  Supervision/Verbal cueing (slide board) Chair/bed transfer assistive device: Sliding board   Locomotion Ambulation   Ambulation assist   Ambulation activity did not occur: Safety/medical concerns          Walk 10 feet activity   Assist  Walk 10 feet activity did not occur: Safety/medical concerns        Walk 50 feet activity   Assist Walk 50 feet with 2 turns activity did not occur: Safety/medical concerns         Walk 150 feet activity   Assist Walk 150 feet activity did not occur: Safety/medical concerns         Walk 10 feet on uneven surface  activity   Assist Walk 10 feet on uneven surfaces activity did not occur: Safety/medical concerns         Wheelchair     Assist Will patient use wheelchair at discharge?: Yes Type of Wheelchair: Manual    Wheelchair assist level: Supervision/Verbal cueing, Set up assist Max wheelchair distance: 187ft    Wheelchair 50 feet with 2 turns activity    Assist        Assist Level: Supervision/Verbal cueing   Wheelchair 150 feet activity     Assist      Assist Level: Supervision/Verbal cueing, Contact Guard/Touching assist   Medical Problem List and Plan: 1.  Impaired mobility and ADLs secondary to L BKA  Continue CIR 2.  Antithrombotics: -DVT/anticoagulation:  Pharmaceutical: Lovenox             -antiplatelet therapy: N/A 3. Chronic pain/Pain Management: Was on oxycodone 5 mg tid prn as well Cymbalta for neuropathy (at baseline)- now on 10-15 mg q4 hours prn  11/30- pain better controlled with q4 hours prn pain meds  12/1- con't pain meds at current dose- could possibly start long acting pain meds- will d/w pt.  4. Mood: Team to provide ego support/encouragement.              -antipsychotic agents: N/A 5. Neuropsych: This patient is capable of making decisions on her own behalf. 6. Skin/Wound Care: Monitor wound daily   Continue stump shrinker   11/30- will call Vascular on Thursday  about getting sutures out of L BKA- has been 4 weeks.  7. Fluids/Electrolytes/Nutrition: Strict I/O. 1200 cc FR. Will consult dietician to educate on renal diet.  8. Left hip ORIF/hip abscess s/p I&D 11/18: NWB LLE.   Flagyl changed to Ertepenem due to patient refusal, continue  12/1- on Ertepenem until end of December.  9. 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 T1DM with neuropathy: Hgb A1c-6.2. Was on Lantus 50 units in am with 10 units novolog tid.     Lantus 38 twice daily, decreased to 10 on 11/27, increased to 15 on 11/28  NovoLog 5 units with breakfast, 8 units with lunch and dinner, decreased to 5 3 times daily on 11/27, DC'd on 11/27.  Monitor BS ac/hs and use SSI for elevated  BS.   Significantly labile, with severe symptomatic hypoglycemia  11/29- BGs 129-230 off Novolog with meals- will con't for now.  12/1- BGs 140s-180- doing better- con't regimen 11. Now ESRD: Now on HD MWF (set up at [place] post discharge)  12. Anemia of chronic disease/iron deficiency: Hgb-6.3 at admission--->9.1 s/p 4 units PRBC. Feraheme X 1 (additional dose held due to infection) and Aranesp weekly.   Hemoglobin 9.7 on 11/26  Continue to monitor 13. HTN: Monitor BP tid--continue Coreg bid, Norvasc and apresoline tid.   Decreased Coreg to 12.5mg  BID.   Controlled on 11/28 14. IBS-D:   MiraLAX DC'd  Resumed Lotrenex    Colace as needed ordered  11/30- LBM yesterday- con't regimen  12/1- will wait on bowel meds for now- if no BM by tomorrow, will give Sorbitol 15. Malnutrition: Continue protein supplement.  16. Abdominal pain: Due to diarrhea? PPI resumed-->patient to have family bring in [place] from home.  17.  Sleep disturbance:   Low dose elavil at bedtime.  Melatonin added on 11/26  Improving overall 18. Leukocytosis:   WBCs 11.6 on 11/26, labs with HD  See #8, #9  Afebrile  11/30- WBC down to 10.3- con't to monitor  LOS: 9 days A  FACE TO FACE EVALUATION WAS PERFORMED  Angalena Cousineau 02/01/2020, 8:23 AM

## 2020-02-01 NOTE — Progress Notes (Signed)
Physical Therapy Weekly Progress Note  Patient Details  Name: [name] MRN: [id number] Date of Birth: 1966/02/15  Beginning of progress report period: January 24, 2020 End of progress report period: February 01, 2020  Today's Date: 02/01/2020 PT Individual Time: 0930-1030 PT Individual Time Calculation (min): 60 min   Patient has met 4 of 4 short term goals.  Patient improving in activity tolerance, balance, transfers, w/c mobility and L LE strength/ROM.  She is still experiencing L hip spasms which limit tolerance and progress at times.  Although able to transfer with assist of 1 needs elevated height and limited tolerance with use of limb protector.    Patient continues to demonstrate the following deficits muscle weakness, muscle joint tightness and pain L hip and therefore will continue to benefit from skilled PT intervention to increase functional independence with mobility.  Patient progressing toward long term goals..  Continue plan of care.  PT Short Term Goals Week 1:  PT Short Term Goal 1 (Week 1): Patient will perform bed mobility supine to sit with min A; sit to supine with mod A PT Short Term Goal 1 - Progress (Week 1): Met PT Short Term Goal 2 (Week 1): Patient will perform bed to chair transfers with slide board and mod A PT Short Term Goal 2 - Progress (Week 1): Met PT Short Term Goal 3 (Week 1): Patient to propel w/c with S x 100' PT Short Term Goal 3 - Progress (Week 1): Met PT Short Term Goal 4 (Week 1): patient to perform sit to stand with max A of 1 PT Short Term Goal 4 - Progress (Week 1): Met Week 2:  PT Short Term Goal 1 (Week 2): Patient will demonstrate standing balance with CGA x 2 minutes with 1-2 UE support in prep for standing ADL. PT Short Term Goal 2 (Week 2): Patient will perform slide board transfers with close S and min cues including w/c set up and board plaement. PT Short Term Goal 3 (Week 2): Patient will perform sit to stand consistently with  mod A. PT Short Term Goal 4 (Week 2): Patient to initiate gait traning in parallel bars with mod A  Skilled Therapeutic Interventions/Progress Updates:  Patient seen initially at bedside.  Reports worked on dressing in bed earlier today.  Performed supine to sit with elevated HOB and rail use with S, increased time and effort.  Patient seated EOB with S.  Performed slide board transfer with min A to initiate after assist for w/c set up and board placement.  Patient able to place R legrest with cues and min A after cues.  Placed armrest with mod A.  Propelled w/c with S x 150' to therapy gym.  Worked on w/c set up with cues and min A for transfer.  Switched out slide board for longer board with handles for pt to be able to retrieve from bag behind w/c.  Patient placed board with mod A due to difficulty weight shifting to L.  Min A initially to get onto board and finished with S and increased time.  Patient sit to stand x 2 to RW from elevated height of mat using momentum and mod to max A.  Patient standing x about 30-45 seconds for L LE hip flex and extension, then R knee flex/ext.  Patient performed slide board back to w/c with CGA to initiate and cues with mod A for board placement.  Propelled to room with S x 150'.  RN informed pt  needed pain meds.  Transferred back to bed with min A for board and initiating transfer.  Scooting up to [facility] while seated with S to min A.  Sit to supine with S.  Left with needs in reach and bed alarm active.   Ambulation/gait training;Balance/vestibular training;DME/adaptive equipment instruction;Neuromuscular re-education;UE/LE Strength taining/ROM;Wheelchair propulsion/positioning;Therapeutic Activities;Therapeutic Exercise;Patient/family education;Functional mobility training;Disease management/prevention;Pain management;Skin care/wound management   Therapy Documentation Precautions:  Precautions Precautions: Fall Precaution Comments: new L BKA Required Braces or  Orthoses: Other Brace Other Brace: L LE limb guard Restrictions Weight Bearing Restrictions: Yes LLE Weight Bearing: Non weight bearing General:   Vital Signs:  Pain: Pain Assessment Pain Scale: 0-10 Pain Score: 8  Pain Type: Acute pain;Surgical pain Pain Location: Hip Pain Orientation: Left Pain Descriptors / Indicators: Spasm;Sore Pain Frequency: Constant Pain Onset: With Activity Patients Stated Pain Goal: 2 Pain Intervention(s): RN made aware;Rest   Therapy/Group: Individual Therapy  Reginia Naas  Magda Kiel, PT 02/01/2020, 11:34 AM

## 2020-02-01 NOTE — Progress Notes (Signed)
Occupational Therapy Weekly Progress Note  Patient Details  Name: [name] MRN: [id number] Date of Birth: 02/16/1966  Beginning of progress report period: January 24, 2020 End of progress report period: February 01, 2020  Today's Date: 02/01/2020 OT Individual Time: 9833-8250 and 5397-6734 OT Individual Time Calculation (min): 73 min and 56 min   Patient has met 4 of 4 short term goals.  Pt is currently Mod A with LB bathing and dressing at bed level with use of AE and bed features, plan to progress to sitting EOB with lateral leans for LB ADL tasks. Pt is performing slide board transfers to/from bed <> w/c with Min A, plan to continue to work on [facility] transfers. Pt's pain has improved from last week in L hip, able to tolerate more activity but continues to have L hip pain.   Patient continues to demonstrate the following deficits: muscle weakness, decreased cardiorespiratoy endurance and decreased sitting balance, decreased standing balance, decreased postural control and decreased balance strategies and therefore will continue to benefit from skilled OT intervention to enhance overall performance with BADL and Reduce care partner burden.  Patient progressing toward long term goals..  Continue plan of care.  OT Short Term Goals Week 1:  OT Short Term Goal 1 (Week 1): Pt will transfer with 1 assist in prep for toilet transfer and to decrease BOC OT Short Term Goal 1 - Progress (Week 1): Met OT Short Term Goal 2 (Week 1): Pt will perform LB dressing with Max A of 1 OT Short Term Goal 2 - Progress (Week 1): Met OT Short Term Goal 3 (Week 1): Pt will perform LB bathing with AE PRN with Mod A OT Short Term Goal 3 - Progress (Week 1): Met OT Short Term Goal 4 (Week 1): Pt will perform ADL activity for 10 mins without fatigue OT Short Term Goal 4 - Progress (Week 1): Met Week 2:  OT Short Term Goal 1 (Week 2): Pt will perform BSC/toilet transfer with Mod of 1 OT Short Term Goal 2 (Week  2): Pt will perform LB dress EOB level with Mod A and lateral leans with AE PRN OT Short Term Goal 3 (Week 2): Pt will perform sit to stand Mod A in prep for LB ADL  Skilled Therapeutic Interventions/Progress Updates:    Pt greeted at time of session semi reclined in bed, MD making morning rounds and RN doing med pass. Pt had not eaten breakfast, given a few minutes at beginning of session to eat breakfast with Indep. Agreeable to ADL this am, supine to sit EOB Supervision with bed features and HOB elevated. Pt assisting with helping place board this am, slide board transfer to w/c with CGA/Min A and self propel to sink level. Doffed UB clothing without assist, UB bathing Supervision and donned shirt back with CGA. Oral hygiene Mod I from wheelchair level. Attempted to doff pants sitting in wheelchair at sink level with pushing self through armrests and lifting buttocks and mild lateral leans, too difficult at this time. Slide board back to bed for LB ADL with Min A slightly uphill. Doffed pants EOB with Min A with lateral leans on a larger surface, sit to supine Supervision, rolled L and R to doff wet brief and therapist assist with washing buttocks, pt able to wash front periarea with HOB elevated. Donned home brief and pants with Mod A bed level with reacher, rolling L/R with Min and sometimes CGA to don over hips with pt able  to perform. Positioned for comfort, all needs met, alarm on, call bell in reach.   Session 2: Pt greeted at time of session reclined in bed with RN performing med pass, pt c/o pain and spasms in L hip but no number given. Provided moist heat for L thigh and hip avoiding healing incision site and pt did not have more spasms with heat, applied for 15 minutes with skin in tact pre and post. Pt stating heat made hip feel better. Declined sitting EOB or getting up in the wheelchair to prevent pain, BUE there ex with 4# dowel supine with HOB elevated chest press, overhead raises, bicep  curls all with cues for pacing and body mechanics in reps of 20-30 pending fatigue. Rest breaks in between sets. Pt had questions regarding prosthetic and extensive discussion regarding timeline for prosthetic, WB status, transportation for dialysis once home, etc. Pt semireclined in bed resting with alarm on, call bell in reach.   Therapy Documentation Precautions:  Precautions Precautions: Fall Precaution Comments: new L BKA Required Braces or Orthoses: Other Brace Other Brace: L LE limb guard Restrictions Weight Bearing Restrictions: Yes LLE Weight Bearing: Non weight bearing    Therapy/Group: Individual Therapy  [name] 02/01/2020, 7:38 AM

## 2020-02-02 ENCOUNTER — Inpatient Hospital Stay (HOSPITAL_COMMUNITY): Payer: Medicaid Other | Admitting: Occupational Therapy

## 2020-02-02 ENCOUNTER — Inpatient Hospital Stay (HOSPITAL_COMMUNITY): Payer: Medicaid Other | Admitting: Physical Therapy

## 2020-02-02 ENCOUNTER — Inpatient Hospital Stay (HOSPITAL_COMMUNITY): Payer: Medicaid Other

## 2020-02-02 ENCOUNTER — Other Ambulatory Visit: Payer: Self-pay

## 2020-02-02 DIAGNOSIS — N185 Chronic kidney disease, stage 5: Secondary | ICD-10-CM

## 2020-02-02 DIAGNOSIS — S88112A Complete traumatic amputation at level between knee and ankle, left lower leg, initial encounter: Secondary | ICD-10-CM | POA: Diagnosis not present

## 2020-02-02 LAB — GLUCOSE, CAPILLARY
Glucose-Capillary: 96 mg/dL (ref 70–99)
Glucose-Capillary: 160 mg/dL — ABNORMAL HIGH (ref 70–99)
Glucose-Capillary: 171 mg/dL — ABNORMAL HIGH (ref 70–99)
Glucose-Capillary: 169 mg/dL — ABNORMAL HIGH (ref 70–99)

## 2020-02-02 MED ORDER — SALINE SPRAY 0.65 % NA SOLN: 1.0000 | NASAL | 0 refills | Status: DC | PRN

## 2020-02-02 MED ORDER — SORBITOL 70 % SOLN
30.0000 mL | Freq: Once | Status: AC
  Administered 2020-02-02: 30 mL via ORAL
  Filled 2020-02-02: qty 30

## 2020-02-02 MED ORDER — MUSCLE RUB 10-15 % EX CREA: 1.0000 "application " | TOPICAL_CREAM | CUTANEOUS | 0 refills | Status: AC | PRN

## 2020-02-02 MED ORDER — MORPHINE SULFATE ER 15 MG PO TBCR
15.0000 mg | EXTENDED_RELEASE_TABLET | Freq: Two times a day (BID) | ORAL | Status: DC
  Administered 2020-02-02 – 2020-02-20 (×37): 15 mg via ORAL
  Filled 2020-02-02 (×38): qty 1

## 2020-02-02 MED ORDER — DOCUSATE SODIUM 100 MG PO CAPS
100.0000 mg | ORAL_CAPSULE | Freq: Two times a day (BID) | ORAL | Status: DC
  Administered 2020-02-02 – 2020-02-04 (×4): 100 mg via ORAL
  Filled 2020-02-02 (×4): qty 1

## 2020-02-02 NOTE — Progress Notes (Signed)
Occupational Therapy Session Note  Patient Details  Name: [name] MRN: [id number] Date of Birth: [birth year]  Today's Date: 02/02/2020 OT Individual Time: 4818-5909 OT Individual Time Calculation (min): 30 min    Short Term Goals: Week 2:  OT Short Term Goal 1 (Week 2): Pt will perform BSC/toilet transfer with Mod of 1 OT Short Term Goal 2 (Week 2): Pt will perform LB dress EOB level with Mod A and lateral leans with AE PRN OT Short Term Goal 3 (Week 2): Pt will perform sit to stand Mod A in prep for LB ADL  Skilled Therapeutic Interventions/Progress Updates:    Pt received in bed and agreeable to therapy. She was most interested in working on UE strengthening to help her with transfers.  Pt sat to EOB with supervision.   Upper back strengthening with level 2 theraband focusing on lat pull downs, rows, and horizontal abduction 12 x 3 sets. Sh presses and overhead tricep extensions 12 x2 with 3 lb bar. tricep extension with band by punching arm forward as therapist held band behind her 12 x3 each arm.  Pt tolerated exercises well and may benefit from a more difficult resistance band.  Encouraged her to do the exercises on her own.  Pt moved back to supine independently. Resting in bed with all needs met.  Therapy Documentation Precautions:  Precautions Precautions: Fall Precaution Comments: new L BKA Required Braces or Orthoses: Other Brace Other Brace: L LE limb guard Restrictions Weight Bearing Restrictions: Yes LLE Weight Bearing: Non weight bearing    Vital Signs: Therapy Vitals Temp: 98.1 F (36.7 C) Pulse Rate: (!) 59 Resp: 16 BP: (!) 134/54 Patient Position (if appropriate): Lying Oxygen Therapy SpO2: 95 % O2 Device: Room Air Pain: Pain Assessment Pain Scale: 0-10 Pain Score: 8  Pain Type: Acute pain Pain Location: Hip Pain Orientation: Left Pain Descriptors / Indicators: Aching Pain Frequency: Constant Patients Stated Pain Goal: 2 Pain  Intervention(s): Medication (See eMAR) ADL: ADL Upper Body Bathing: Contact guard Lower Body Bathing: Maximal assistance Where Assessed-Lower Body Bathing: Bed level Upper Body Dressing: Minimal assistance Where Assessed-Upper Body Dressing: Edge of bed Lower Body Dressing: Dependent Where Assessed-Lower Body Dressing: Bed level Toileting: Not assessed (incontinent of bowel, did not need to urinate)  Therapy/Group: Individual Therapy  [place] 02/02/2020, 8:43 AM

## 2020-02-02 NOTE — Progress Notes (Signed)
Physical Therapy Session Note  Patient Details  Name: [name] MRN: [id number] Date of Birth: [birth year]  Today's Date: 02/02/2020 PT Individual Time: 8563-1497 PT Individual Time Calculation (min): 54 min   Short Term Goals: Week 2:  PT Short Term Goal 1 (Week 2): Patient will demonstrate standing balance with CGA x 2 minutes with 1-2 UE support in prep for standing ADL. PT Short Term Goal 2 (Week 2): Patient will perform slide board transfers with close S and min cues including w/c set up and board plaement. PT Short Term Goal 3 (Week 2): Patient will perform sit to stand consistently with mod A. PT Short Term Goal 4 (Week 2): Patient to initiate gait traning in parallel bars with mod A  Skilled Therapeutic Interventions/Progress Updates:    Pt received supine in bed with her daughter, [name], present, and pt agreeable to therapy session. Pt using ice on L LE for pain management but does report that before the change in pain meds she was only getting relief down to an 8/10 but now feels that is decreased down to a 6/10. Supine>sitting L EOB, HOB partially elevated and using bedrails, with supervision requiring increased time and effort. Sitting EOB donned L LE limb protector with min assist. R lateral scoot transfer EOB>w/c using transfer board with supervision for board placement requiring increased time - CGA for safety while scooting, requires significantly increased time and effort to complete transfer with pt unable to boost up lifting hips during transfer - max assist to remove board once pt in w/c. Donned B LE w/c leg rests with hand-over-hand assist. B UE w/c propulsion ~148ft to main therapy gym with supervision - demos slow propulsion speed. Requires seated rest break. Set-up for lateral scoot transfer to EOM with hand-over-hand assist and cuing for turning w/c and then max cuing for removal of leg rests and arm rest. R lateral scoot transfer slightly uphill w/c>EOM with  supervision for board placement and min assist for scooting/steadying. Attempted sit>stand slightly elevated EOM>RW with pt sliding forwards on mat as opposed to lifting up into standing making it unsafe therefore therapist had to reposition and guard on pt's R side in order to block her knee and assist with R hip/knee extension to achieve standing position via heavy mod assist. Standing with RW and R LE steadying against the mat performed alternate arm raises off RW working on standing balance with min assist - limb guard falling off of L LE in standing. Standing>sitting RW>EOM with min assist for eccentric control with pt having onset of constant L hip pain upon returning to sit likely due to quick movement into hip flexion - requires seated rest break for pain management. L lateral scoot transfer EOM>w/c with min assist for board placement and min assist for scooting - requires increased assist due to onset of pain. Transported back to room in w/c and pt requesting for assistance back to bed. L lateral scoot w/c>EOB with max assist for transfer board placement and min assist for scooting. Sit>supine, HOB partially elevated and using bedrails, with supervision and significantly increased time for B LE management onto the bed. Pt left supine in bed with needs in reach, pillows positioned for LE support, bed alarm on, and RN present for medication administration.   Therapy Documentation Precautions:  Precautions Precautions: Fall Precaution Comments: new L BKA Required Braces or Orthoses: Other Brace Other Brace: L LE limb guard Restrictions Weight Bearing Restrictions: Yes LLE Weight Bearing: Non weight bearing  Pain:  Details above with pt reporting some improvement in pain with recent changes in medication - also uses ice for pain management before and after therapy.   Therapy/Group: Individual Therapy  Tawana Scale , PT, DPT, CSRS  02/02/2020, 12:51 PM

## 2020-02-02 NOTE — Progress Notes (Signed)
[place] PHYSICAL MEDICINE & REHABILITATION PROGRESS NOTE   Subjective/Complaints:  Pt reports that when pain meds wear off, pain skyrockets- is interested in long acting pain medicine. Hard to do next therapy session when this occurs.   Had a tiny stool- hard balls- last night- asking for stool softener only.   Agreed that will sorbitol if no BM today.   ROS:   Pt denies SOB, abd pain, CP, N/V/C/D, and vision changes  Objective:   No results found. Recent Labs    01/30/20 1343 02/01/20 1332  WBC 10.3 11.1*  HGB 9.2* 9.5*  HCT 30.2* 30.9*  PLT 302 295   Recent Labs    01/30/20 1343 02/01/20 1331  NA 129* 130*  K 4.4 4.1  CL 93* 94*  CO2 25 26  GLUCOSE 207* 131*  BUN 55* 46*  CREATININE 7.35* 6.40*  CALCIUM 8.0* 8.5*    Intake/Output Summary (Last 24 hours) at 02/02/2020 0826 Last data filed at 02/02/2020 0730 Gross per 24 hour  Intake 360 ml  Output 4000 ml  Net -3640 ml     Pressure Injury 01/23/20 Buttocks Left Deep Tissue Pressure Injury - Purple or maroon localized area of discolored intact skin or blood-filled blister due to damage of underlying soft tissue from pressure and/or shear. Circular, dark, red area, surrounde (Active)  01/23/20 1609  Location: Buttocks  Location Orientation: Left  Staging: Deep Tissue Pressure Injury - Purple or maroon localized area of discolored intact skin or blood-filled blister due to damage of underlying soft tissue from pressure and/or shear.  Wound Description (Comments): Circular, dark, red area, surrounded by MASD  Present on Admission: Yes    Physical Exam: Vital Signs Blood pressure (!) 134/54, pulse (!) 59, temperature 98.1 F (36.7 C), resp. rate 16, height 5\' 10"  (1.778 m), weight 89.1 kg, SpO2 95 %. Constitutional: sitting up in bed, appropriate, NAD HENT: Normocephalic.  Atraumatic. Eyes: EOMI. No discharge. Cardiovascular: borderline bradycardia- regular rhythm Respiratory: CTA B/L- no W/R/R- good  air movement GI: Soft, NT, ND, (+)BS  Skin: Warm and dry.  L BKA-  No drainage, no erythema- dog ears better- shaping well. L hip looks great- sutures in place- no swelling/erythema Psych: flat affect Musc: Left BKA with edema and tenderness Right lower extremity edema, stable Neuro: Alert Motor: Right lower extremity: Hip flexion, knee extension 4-5/5 (pain inhibition), ankle dorsiflexion 4+/5, unchanged Left lower extremity: Hip flexion, knee extension 3/5 (some pain inhibition), ankle dorsiflexion 4/5   Assessment/Plan: 1. Functional deficits which require 3+ hours per day of interdisciplinary therapy in a comprehensive inpatient rehab setting.  Physiatrist is providing close team supervision and 24 hour management of active medical problems listed below.  Physiatrist and rehab team continue to assess barriers to discharge/monitor patient progress toward functional and medical goals  Care Tool:  Bathing    Body parts bathed by patient: Right arm, Left arm, Chest, Abdomen, Front perineal area, Face, Right upper leg, Left upper leg   Body parts bathed by helper: Buttocks, Right lower leg (RLE for thoroughness) Body parts n/a: Left lower leg   Bathing assist Assist Level: Minimal Assistance - Patient > 75% (sink level UB, bed level LB)     Upper Body Dressing/Undressing Upper body dressing   What is the patient wearing?: Pull over shirt    Upper body assist Assist Level: Contact Guard/Touching assist    Lower Body Dressing/Undressing Lower body dressing      What is the patient wearing?: Pants, Underwear/pull  up     Lower body assist Assist for lower body dressing: Moderate Assistance - Patient 50 - 74% (with reacher)     Toileting Toileting Toileting Activity did not occur (Clothing management and hygiene only): N/A (no void or bm)  Toileting assist Assist for toileting: Independent with assistive device Assistive Device Comment: female urinal    Transfers Chair/bed transfer  Transfers assist  Chair/bed transfer activity did not occur: Safety/medical concerns  Chair/bed transfer assist level: Minimal Assistance - Patient > 75% Chair/bed transfer assistive device: Sliding board   Locomotion Ambulation   Ambulation assist   Ambulation activity did not occur: Safety/medical concerns          Walk 10 feet activity   Assist  Walk 10 feet activity did not occur: Safety/medical concerns        Walk 50 feet activity   Assist Walk 50 feet with 2 turns activity did not occur: Safety/medical concerns         Walk 150 feet activity   Assist Walk 150 feet activity did not occur: Safety/medical concerns         Walk 10 feet on uneven surface  activity   Assist Walk 10 feet on uneven surfaces activity did not occur: Safety/medical concerns         Wheelchair     Assist Will patient use wheelchair at discharge?: Yes Type of Wheelchair: Manual    Wheelchair assist level: Supervision/Verbal cueing, Set up assist Max wheelchair distance: 150'    Wheelchair 50 feet with 2 turns activity    Assist        Assist Level: Supervision/Verbal cueing   Wheelchair 150 feet activity     Assist      Assist Level: Supervision/Verbal cueing   Medical Problem List and Plan: 1.  Impaired mobility and ADLs secondary to L BKA  Continue CIR 2.  Antithrombotics: -DVT/anticoagulation:  Pharmaceutical: Lovenox             -antiplatelet therapy: N/A 3. Chronic pain/Pain Management: Was on oxycodone 5 mg tid prn as well Cymbalta for neuropathy (at baseline)- now on 10-15 mg q4 hours prn  11/30- pain better controlled with q4 hours prn pain meds  12/1- con't pain meds at current dose- could possibly start long acting pain meds- will d/w pt.   12/2- will start MS Contin 15 mg BID 4. Mood: Team to provide ego support/encouragement.              -antipsychotic agents: N/A 5. Neuropsych: This patient  is capable of making decisions on her own behalf. 6. Skin/Wound Care: Monitor wound daily   Continue stump shrinker   11/30- will call Vascular on Thursday about getting sutures out of L BKA- has been 4 weeks.  7. Fluids/Electrolytes/Nutrition: Strict I/O. 1200 cc FR. Will consult dietician to educate on renal diet.  8. Left hip ORIF/hip abscess s/p I&D 11/18: NWB LLE.   Flagyl changed to Ertepenem due to patient refusal, continue  12/1- on Ertepenem until end of December.  9. 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 T1DM with neuropathy: Hgb A1c-6.2. Was on Lantus 50 units in am with 10 units novolog tid.     Lantus 38 twice daily, decreased to 10 on 11/27, increased to 15 on 11/28  NovoLog 5 units with breakfast, 8 units with lunch and dinner, decreased to 5 3 times daily on 11/27, DC'd on 11/27.  Monitor BS ac/hs  and use SSI for elevated  BS.   Significantly labile, with severe symptomatic hypoglycemia  11/29- BGs 129-230 off Novolog with meals- will con't for now.   12/2- BG 111-184- con't regimen 11. Now ESRD: Now on HD MWF (set up at [place] post discharge)  12. Anemia of chronic disease/iron deficiency: Hgb-6.3 at admission--->9.1 s/p 4 units PRBC. Feraheme X 1 (additional dose held due to infection) and Aranesp weekly.   Hemoglobin 9.7 on 11/26  Continue to monitor 13. HTN: Monitor BP tid--continue Coreg bid, Norvasc and apresoline tid.   Decreased Coreg to 12.5mg  BID.   Controlled on 11/28 14. IBS-D:   MiraLAX DC'd  Resumed Lotrenex    Colace as needed ordered  11/30- LBM yesterday- con't regimen  12/1- will wait on bowel meds for now- if no BM by tomorrow, will give Sorbitol  12/2- will make colace scheduled and add sorbitol if no BM by 3pm 15. Malnutrition: Continue protein supplement.  16. Abdominal pain: Due to diarrhea? PPI resumed-->patient to have family bring in [place] from home.  17.  Sleep disturbance:   Low  dose elavil at bedtime.  Melatonin added on 11/26  Improving overall 18. Leukocytosis:   WBCs 11.6 on 11/26, labs with HD  See #8, #9  Afebrile  11/30- WBC down to 10.3- con't to monitor  12/2- WBC 11.1- afebrile; has no Sx's of infection- con't to monitor  LOS: 10 days A FACE TO FACE EVALUATION WAS PERFORMED  Page Pucciarelli 02/02/2020, 8:26 AM

## 2020-02-02 NOTE — Progress Notes (Signed)
Physical Therapy Session Note  Patient Details  Name: [name] MRN: [id number] Date of Birth: 07/29/1965  Today's Date: 02/02/2020 PT Individual Time: 7893-8101 PT Individual Time Calculation (min): 46 min   Short Term Goals: Week 2:  PT Short Term Goal 1 (Week 2): Patient will demonstrate standing balance with CGA x 2 minutes with 1-2 UE support in prep for standing ADL. PT Short Term Goal 2 (Week 2): Patient will perform slide board transfers with close S and min cues including w/c set up and board plaement. PT Short Term Goal 3 (Week 2): Patient will perform sit to stand consistently with mod A. PT Short Term Goal 4 (Week 2): Patient to initiate gait traning in parallel bars with mod A  Skilled Therapeutic Interventions/Progress Updates:    Patient in supine reports pain medication changes have improved pain today.  Reports able to sit up on EOB on her own this morning.  Supine to sit with supervision HOB elevated using bed rails.  C/o initial dizziness, BP checked and WNL and pt reported symptoms subsided after about 30 seconds.  Slide board transfer with assist for set up and board placement.  Patient in w/c needing min A for w/c set up.  Propelled in w/c to ortho gym 2 x 100' with S and cues for technique.  Performed seated armchair pushups x 6, seated LAQ x 10 w/ 5# and 5 sec hold.  Car transfer with max A using slide board to simulated SUV height.  Total A for board placement both into and out of car.  Needed cues for safety with keeping L LE NWB throughout.  Patient performed w/c mobility back to room.  Educated and demonstrated squat pivot transfer and discussed to start practicing to ease car transfers to SUV height.  Patient declined attempting today.  Performed slide board transfer to bed with min A after board placement due to fatigue and increased L hip pain.  Sit to supine with S.  Left with needs in reach and bed alarm active.   Therapy Documentation Precautions:   Precautions Precautions: Fall Precaution Comments: new L BKA Required Braces or Orthoses: Other Brace Other Brace: L LE limb guard Restrictions Weight Bearing Restrictions: Yes LLE Weight Bearing: Non weight bearing Pain: Pain Assessment Pain Scale: 0-10 Pain Score: 7  Pain Type: Acute pain Pain Location: Hip Pain Orientation: Left Pain Descriptors / Indicators: Spasm Pain Onset: With Activity Pain Intervention(s): Repositioned    Therapy/Group: Individual Therapy  Reginia Naas  Magda Kiel, PT 02/02/2020, 2:39 PM

## 2020-02-02 NOTE — Evaluation (Signed)
Recreational Therapy Assessment and Plan  Patient Details  Name: [name] MRN: [id number] Date of Birth: 07-Jul-1965 Today's Date: 02/02/2020  Rehab Potential:  Good ELOS:   12/21 Assessment Hospital Problem: Principal Problem:   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[place]) Active Problems:   ESRD on dialysis [facility])   Past Medical History:      Past Medical History:  Diagnosis Date  . Anxiety 2002  . Chest tightness   . Depression 2001  . Diabetes type 1, uncontrolled ([place])    at age 66  . Diabetic neuropathy ([place])   . Essential hypertension 2015  . GERD (gastroesophageal reflux disease)    about age of 55  . Nausea and vomiting in adult   . Stroke ([place])   . Urinary frequency   . Yeast vaginitis    Past Surgical History:  Past Surgical History:  Procedure Laterality Date  . ACHILLES TENDON SURGERY Left 03/10/2013   Procedure: LEFT CHOPART AMPUTATION/ LEFT TENDON ACHILLES [place];  Surgeon: Wylene Simmer, MD;  Location: [place];  Service: Orthopedics;  Laterality: Left;  . AMPUTATION Left 06/15/2012   Procedure: AMPUTATION DIGIT;  Surgeon: Meredith Pel, MD;  Location: [place];  Service: Orthopedics;  Laterality: Left;  Left great toe revision amputation  . AMPUTATION Left 01/12/2020   Procedure: AMPUTATION BELOW KNEE;  Surgeon: Wylene Simmer, MD;  Location: [place];  Service: Orthopedics;  Laterality: Left;  . AV FISTULA PLACEMENT Left 01/09/2020   Procedure: LEFT UPPER ARM ARTERIOVENOUS (AV) FISTULA CREATION;  Surgeon: Cherre Robins, MD;  Location: [place];  Service: Vascular;  Laterality: Left;  . ENDOMETRIAL ABLATION    . I & D EXTREMITY Left 01/19/2020   Procedure: IRRIGATION AND DEBRIDEMENT Left Hip;  Surgeon: Rod Can, MD;  Location: [place];  Service: Orthopedics;  Laterality: Left;  . INSERTION OF DIALYSIS CATHETER Right 01/09/2020   Procedure: INSERTION OF DIALYSIS CATHETER;  Surgeon: Cherre Robins, MD;  Location: [place];   Service: Vascular;  Laterality: Right;  . INTRAMEDULLARY (IM) NAIL INTERTROCHANTERIC Left 12/29/2019   Procedure: INTRAMEDULLARY (IM) NAIL INTERTROCHANTRIC;  Surgeon: Rod Can, MD;  Location: [facility];  Service: Orthopedics;  Laterality: Left;  . IR ANGIOGRAM EXTREMITY LEFT  12/17/2018  . IR FEM POP ART ATHERECT INC PTA MOD SED  12/17/2018  . IR RADIOLOGIST EVAL & MGMT  11/30/2018  . IR RADIOLOGIST EVAL & MGMT  12/09/2018  . IR RADIOLOGIST EVAL & MGMT  02/08/2019  . IR US GUIDE VASC ACCESS RIGHT  12/17/2018  . 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 She underwent IM nailing of Left hip by Dr. Doran Durand with Sq lovenox for DVT prophylaxis. Post op had worsening of renal status with fluid overload, metabolic acidosis, lethargy concerning for uremia as well as progressive leucocytosis but declined MRI of left foot initially. MRI was done 11/7 and showed 3.3 cm X 2.9 cm abscess with extensive osteomyelitis and numerous small bone abscesses. She was started on IV ancef. Due to acute on chronic renal failure tunneled catheter was placed by Dr. Stanford Breed on 11/08 and HD initiated.   Dr. Doran Durand consulted for input and recommended L-BKA for definitive treatment. Patient was agreeable  to undergo L-BKA on 11/11 and is NWB LLE with stump sock/stump protector. She was found to develop purulent drainage from left hip incision on 11/18 and underwent I&D of hip abscess with debridement of skin, SQ tissue and muscle with drain placement by Dr. Lyla Glassing. Dr. Linus Salmons consulted for input on antibiotic coverage as wound culture positive for enterobacter cloacae--->now on Cefepime and flagyl for 4 weeks. pain control improving--not  taking IV dilaudid since yesterday. Penrose drain removed from left hip today. Therapy ongoing and patient limited by weakness, anxiety and pain affecting ADLs and mobility. CIR recommended due to functional decline.   Patient transferred to CIR on 01/23/2020.  Met with pt per team referral.  Pt presents with decreased activity tolerance, decreased functional mobility, decreased balance, feelings of stress/anxiety Limiting pt's independence with leisure/community pursuits.  Discussion included stress management & relaxation strategies including diaphragmatic breathing and visualization.  Plan Min 1 TR session >20 minutes per week during LOS  Recommendations for other services: None   Discharge Criteria: Patient will be discharged from TR if patient refuses treatment 3 consecutive times without medical reason.  If treatment goals not met, if there is a change in medical status, if patient makes no progress towards goals or if patient is discharged from hospital.  The above assessment, treatment plan, treatment alternatives and goals were discussed and mutually agreed upon: by patient  [place] 02/02/2020, 4:20 PM

## 2020-02-02 NOTE — Plan of Care (Signed)
  Problem: RH SKIN INTEGRITY Goal: RH STG SKIN FREE OF INFECTION/BREAKDOWN Description: With min assist Outcome: Progressing Goal: RH STG MAINTAIN SKIN INTEGRITY WITH ASSISTANCE Description: STG Maintain Skin Integrity With  min Assistance. Outcome: Progressing Goal: RH STG ABLE TO PERFORM INCISION/WOUND CARE W/ASSISTANCE Description: STG Able To Perform Incision/Wound Care With  min Assistance. Outcome: Progressing   Problem: RH SAFETY Goal: RH STG ADHERE TO SAFETY PRECAUTIONS W/ASSISTANCE/DEVICE Description: STG Adhere to Safety Precautions With cues/reminders Assistance/Device. Outcome: Progressing

## 2020-02-02 NOTE — Progress Notes (Signed)
Occupational Therapy Session Note  Patient Details  Name: [name] MRN: [id number] Date of Birth: 1966-01-14  Today's Date: 02/02/2020 OT Individual Time: 8413-2440 OT Individual Time Calculation (min): 58 min    Short Term Goals: Week 2:  OT Short Term Goal 1 (Week 2): Pt will perform BSC/toilet transfer with Mod of 1 OT Short Term Goal 2 (Week 2): Pt will perform LB dress EOB level with Mod A and lateral leans with AE PRN OT Short Term Goal 3 (Week 2): Pt will perform sit to stand Mod A in prep for LB ADL  Skilled Therapeutic Interventions/Progress Updates:    Pt greeted at time of session reclined in bed resting, no c/o pain initially but did have some discomfort in L hip later in session with activity, no number but rest breaks PRN. At bed level, doffed pants and soiled brief (urine only) with Min A with use of reacher while rolling L and R. Pt able to wash periarea with HOB elevated with supervision at bed level, donned new brief bed level with reacher CGA before supine to sit Supervision and donned pants sitting up at EOB with Min A, able to use reacher to thread and lateral leans with extended time to don over hips. Pt did have an episode of lightheadedness with supine to sit, BP checked and WNL. trialed turning head in different directions, unable to replicate. Resolved quickly. Slide board transfer bed <> drop arm BSC after demonstration with Min A, did not need to use the bathroom but for practice. Discussed home set up at new place and need for door widths and bathroom set up. Sit to supine Supervision, alarm on, call bell in reach.   Therapy Documentation Precautions:  Precautions Precautions: Fall Precaution Comments: new L BKA Required Braces or Orthoses: Other Brace Other Brace: L LE limb guard Restrictions Weight Bearing Restrictions: Yes LLE Weight Bearing: Non weight bearing      Therapy/Group: Individual Therapy  [name] 02/02/2020, 10:41 AM

## 2020-02-02 NOTE — Progress Notes (Signed)
Renal Navigator reviewed chart and provided update to outpatient HD clinic/[place] of estimated discharge date of 02/21/20. Renal Navigator will continue to follow.  Alphonzo Cruise, [place] Renal Navigator [phone]

## 2020-02-02 NOTE — Progress Notes (Signed)
[facility] Progress Note   Subjective: Seen in room. No issues with HD 12/01. Next HD 12/03. RLE edema improved but still present. No SOB.    Objective Vitals:   02/01/20 1645 02/01/20 1714 02/01/20 1936 02/02/20 0454  BP: (!) 131/58 (!) 124/55 (!) 122/52 (!) 134/54  Pulse: (!) 59 (!) 58 (!) 59 (!) 59  Resp: _0 Temp: 98.9 F (37.2 C) 99.5 F (37.5 C) 98.9 F (37.2 C) 98.1 F (36.7 C)  TempSrc: Oral  Oral   SpO2: 96% 98% 96% 95%  Weight: 88.7 kg   89.1 kg  Height:       Physical Exam General:Pleasant female in NAD Heart:S1,S2 RRR Lungs:CTAB Abdomen:S, NT Extremities:L BKA. Trace-1+ RLE edema Dialysis Access:RIJ TDC Drsg CDI. Maturing L AVF + T/B.    Additional Objective Labs: Basic Metabolic Panel: Recent Labs  Lab 01/27/20 1444 01/30/20 1343 02/01/20 1331  NA 127* 129* 130*  K 4.3 4.4 4.1  CL 91* 93* 94*  CO2 _1 GLUCOSE 111* 207* 131*  BUN 51* 55* 46*  CREATININE 7.03* 7.35* 6.40*  CALCIUM 8.6* 8.0* 8.5*  PHOS 5.0* 5.7* 4.9*   Liver Function Tests: Recent Labs  Lab 01/27/20 1444 01/30/20 1343 02/01/20 1331  ALBUMIN 1.9* 1.9* 2.0*   No results for input(s): LIPASE, AMYLASE in the last 168 hours. CBC: Recent Labs  Lab 01/27/20 0441 01/27/20 0441 01/27/20 1421 01/30/20 1343 02/01/20 1332  WBC 10.6*   < > 11.6* 10.3 11.1*  NEUTROABS 6.5  --   --   --   --   HGB 10.0*   < > 9.7* 9.2* 9.5*  HCT 32.7*   < > 31.6* 30.2* 30.9*  MCV 91.3  --  91.3 90.7 89.8  PLT 294   < > 311 302 295   < > = values in this interval not displayed.   Blood Culture    Component Value Date/Time   SDES ABSCESS LEFT HIP 01/19/2020 1424   SPECREQUEST PT ON ANCEF 01/19/2020 1424   CULT  01/19/2020 1424    RARE ENTEROBACTER CLOACAE MODERATE BACTEROIDES FRAGILIS BETA LACTAMASE POSITIVE Performed at [facility], [place] [address], [place], [place] [address]    REPTSTATUS 01/22/2020 FINAL 01/19/2020 1424    Cardiac  Enzymes: No results for input(s): CKTOTAL, CKMB, CKMBINDEX, TROPONINI in the last 168 hours. CBG: Recent Labs  Lab 02/01/20 0647 02/01/20 1159 02/01/20 1710 02/01/20 2059 02/02/20 0533  GLUCAP 172* 111* 132* 126* 96   Iron Studies: No results for input(s): IRON, TIBC, TRANSFERRIN, FERRITIN in the last 72 hours. _2 @ Studies/Results: No results found. Medications: . sodium chloride Stopped (01/30/20 2014)  . ertapenem 500 mg (02/01/20 1728)   . (feeding supplement) PROSource Plus  30 mL Oral BID BM  . alosetron  0.5 mg Oral BID  . amitriptyline  10 mg Oral QHS  . amLODipine  10 mg Oral Daily  . atorvastatin  40 mg Oral q1800  . carvedilol  12.5 mg Oral BID WC  . Chlorhexidine Gluconate Cloth  6 each Topical Q0600  . darbepoetin (ARANESP) injection - DIALYSIS  100 mcg Intravenous Q Mon-HD  . docusate sodium  100 mg Oral BID  . DULoxetine  60 mg Oral Daily  . enoxaparin (LOVENOX) injection  30 mg Subcutaneous Q24H  . hydrALAZINE  100 mg Oral TID  . insulin aspart  0-5 Units Subcutaneous QHS  . insulin aspart  0-9 Units Subcutaneous TID WC  .  insulin glargine  15 Units Subcutaneous QHS  . melatonin  1.5 mg Oral QHS  . morphine  15 mg Oral Q12H  . multivitamin  1 tablet Oral QHS  . pantoprazole  40 mg Oral Daily  . sevelamer carbonate  800 mg Oral TID WC  . sorbitol  30 mL Oral Once     Dialysis Orders:new start  Assessment/Plan: 1. AoCKD 5,now ESRD: Baseline Creat 4.9- 5.2, eGFR 9-11 from 8/21 - 10/21. Creat 6 on admission, then mid 7's, IVF's given w/o improvementso started HD.Has been clipped to [facility].S/p [facility] and AVF on11/8, 1st HD 11/9. Continue MWF schedule-Next HD 02/03/2020 on schedule.  2. Anemiaof ESRD- Hgb6.3 on admit. S/p 4U PRBCs.Tsat low 10%, got 532m Feraheme on 11/6, holding further doses due to infection. IncreasedAranesp 60 to 100 q week on 11/17.Last Hgb 9.5. 3. L foot stump chronic open wound w/ poss osteo by plain  films-> s/pL BKA 01/12/20 4. L hip fracture - admission diagnosis.S/p ORIF on 10/28 per ortho.S/p I&D surgical wound 11/18.Wasrefusing Flagyl, now looks like on Ertapenem. 5. HTN/ vol - BP's are stable,on hydralazine, amlodipine,carvedilol was decreased then stopped due to bradycardia, but has now been restarted. Withhyponatremia and edema,increasing UF goals with HDand reviewed fluid restrictions.Continue to UF as tolerated. 6. DM - on insulin, per pmd 7. BMM -CorrCa high initially - now improved. No VDRA.Started binder this admission and phos at goal now. PTH 44.  8. Nutrition: albumin very low, on protein supplement.Renal diet w/fluid restrictions.  Severn Goddard H. Levada Bowersox NP-C 02/02/2020, 11:27 AM  [phone]

## 2020-02-03 ENCOUNTER — Inpatient Hospital Stay (HOSPITAL_COMMUNITY): Payer: Medicaid Other

## 2020-02-03 DIAGNOSIS — S88112A Complete traumatic amputation at level between knee and ankle, left lower leg, initial encounter: Secondary | ICD-10-CM | POA: Diagnosis not present

## 2020-02-03 LAB — CBC
HCT: 31.4 % — ABNORMAL LOW (ref 36.0–46.0)
Hemoglobin: 9.6 g/dL — ABNORMAL LOW (ref 12.0–15.0)
MCH: 27.6 pg (ref 26.0–34.0)
MCHC: 30.6 g/dL (ref 30.0–36.0)
MCV: 90.2 fL (ref 80.0–100.0)
Platelets: 285 10*3/uL (ref 150–400)
RBC: 3.48 MIL/uL — ABNORMAL LOW (ref 3.87–5.11)
RDW: 15.1 % (ref 11.5–15.5)
WBC: 11.3 10*3/uL — ABNORMAL HIGH (ref 4.0–10.5)
nRBC: 0 % (ref 0.0–0.2)

## 2020-02-03 LAB — RENAL FUNCTION PANEL
Albumin: 2 g/dL — ABNORMAL LOW (ref 3.5–5.0)
Anion gap: 12 (ref 5–15)
BUN: 44 mg/dL — ABNORMAL HIGH (ref 6–20)
CO2: 26 mmol/L (ref 22–32)
Calcium: 8.6 mg/dL — ABNORMAL LOW (ref 8.9–10.3)
Chloride: 90 mmol/L — ABNORMAL LOW (ref 98–111)
Creatinine, Ser: 6.61 mg/dL — ABNORMAL HIGH (ref 0.44–1.00)
GFR, Estimated: 7 mL/min — ABNORMAL LOW (ref >60)
Glucose, Bld: 136 mg/dL — ABNORMAL HIGH (ref 70–99)
Phosphorus: 5.2 mg/dL — ABNORMAL HIGH (ref 2.5–4.6)
Potassium: 3.9 mmol/L (ref 3.5–5.1)
Sodium: 128 mmol/L — ABNORMAL LOW (ref 135–145)

## 2020-02-03 LAB — GLUCOSE, CAPILLARY
Glucose-Capillary: 105 mg/dL — ABNORMAL HIGH (ref 70–99)
Glucose-Capillary: 131 mg/dL — ABNORMAL HIGH (ref 70–99)
Glucose-Capillary: 153 mg/dL — ABNORMAL HIGH (ref 70–99)
Glucose-Capillary: 160 mg/dL — ABNORMAL HIGH (ref 70–99)

## 2020-02-03 MED ORDER — SODIUM CHLORIDE 0.9 % IV SOLN: 100.0000 mL | INTRAVENOUS | Status: DC | PRN

## 2020-02-03 MED ORDER — PENTAFLUOROPROP-TETRAFLUOROETH EX AERO: 1.0000 "application " | INHALATION_SPRAY | CUTANEOUS | Status: DC | PRN

## 2020-02-03 MED ORDER — HEPARIN SODIUM (PORCINE) 1000 UNIT/ML DIALYSIS: 1000.0000 [IU] | INTRAMUSCULAR | Status: DC | PRN

## 2020-02-03 MED ORDER — HEPARIN SODIUM (PORCINE) 1000 UNIT/ML DIALYSIS
20.0000 [IU]/kg | INTRAMUSCULAR | Status: DC | PRN
  Administered 2020-02-03: 1800 [IU] via INTRAVENOUS_CENTRAL

## 2020-02-03 MED ORDER — ALTEPLASE 2 MG IJ SOLR: 2.0000 mg | Freq: Once | INTRAMUSCULAR | Status: DC | PRN

## 2020-02-03 NOTE — Progress Notes (Signed)
[place] PHYSICAL MEDICINE & REHABILITATION PROGRESS NOTE   Subjective/Complaints:   Had a good sized BM this AM.  Feels a little better now.  Went on commode.  Pain a LOT better with MS Contin- slept well for the first time.    ROS:   Pt denies SOB, abd pain, CP, N/V/C/D, and vision changes   Objective:   No results found. Recent Labs    02/01/20 1332  WBC 11.1*  HGB 9.5*  HCT 30.9*  PLT 295   Recent Labs    02/01/20 1331  NA 130*  K 4.1  CL 94*  CO2 26  GLUCOSE 131*  BUN 46*  CREATININE 6.40*  CALCIUM 8.5*    Intake/Output Summary (Last 24 hours) at 02/03/2020 0820 Last data filed at 02/02/2020 2011 Gross per 24 hour  Intake 718 ml  Output --  Net 718 ml     Pressure Injury 01/23/20 Buttocks Left Deep Tissue Pressure Injury - Purple or maroon localized area of discolored intact skin or blood-filled blister due to damage of underlying soft tissue from pressure and/or shear. Circular, dark, red area, surrounde (Active)  01/23/20 1609  Location: Buttocks  Location Orientation: Left  Staging: Deep Tissue Pressure Injury - Purple or maroon localized area of discolored intact skin or blood-filled blister due to damage of underlying soft tissue from pressure and/or shear.  Wound Description (Comments): Circular, dark, red area, surrounded by MASD  Present on Admission: Yes    Physical Exam: Vital Signs Blood pressure (!) 134/54, pulse (!) 58, temperature 98 F (36.7 C), resp. rate 20, height 5\' 10"  (1.778 m), weight 90.1 kg, SpO2 97 %. Constitutional: sitting up in bed- appropriate, OT in room, NAD HENT: Normocephalic.  Atraumatic. Eyes: EOMI. No discharge. Cardiovascular: borderline bradycardia, regular rhythm Respiratory: CTA B/L- no W/R/R- good air movement GI: Soft, NT, ND, (+)BS hypoactive Skin: Warm and dry.  L BKA-  No drainage, no erythema- dog ears better- shaping well. L hip looks great- sutures out- steristrips in place Psych: flat  affect Musc: Left BKA with edema and tenderness Right lower extremity edema, stable Neuro: Alert Motor: Right lower extremity: Hip flexion, knee extension 4-5/5 (pain inhibition), ankle dorsiflexion 4+/5, unchanged Left lower extremity: Hip flexion, knee extension 3/5 (some pain inhibition), ankle dorsiflexion 4/5   Assessment/Plan: 1. Functional deficits which require 3+ hours per day of interdisciplinary therapy in a comprehensive inpatient rehab setting.  Physiatrist is providing close team supervision and 24 hour management of active medical problems listed below.  Physiatrist and rehab team continue to assess barriers to discharge/monitor patient progress toward functional and medical goals  Care Tool:  Bathing    Body parts bathed by patient: Right arm, Left arm, Chest, Abdomen, Front perineal area, Face, Right upper leg, Left upper leg   Body parts bathed by helper: Buttocks, Right lower leg (RLE for thoroughness) Body parts n/a: Left lower leg   Bathing assist Assist Level: Minimal Assistance - Patient > 75% (sink level UB, bed level LB)     Upper Body Dressing/Undressing Upper body dressing   What is the patient wearing?: Pull over shirt    Upper body assist Assist Level: Contact Guard/Touching assist    Lower Body Dressing/Undressing Lower body dressing      What is the patient wearing?: Pants, Underwear/pull up     Lower body assist Assist for lower body dressing: Minimal Assistance - Patient > 75% (brief bed level rolling, pants EOB with lateral leans)  Toileting Toileting Toileting Activity did not occur Landscape architect and hygiene only): N/A (no void or bm)  Toileting assist Assist for toileting: Independent with assistive device Assistive Device Comment: female urinal   Transfers Chair/bed transfer  Transfers assist  Chair/bed transfer activity did not occur: Safety/medical concerns  Chair/bed transfer assist level: Minimal Assistance -  Patient > 75% Chair/bed transfer assistive device: Sliding board   Locomotion Ambulation   Ambulation assist   Ambulation activity did not occur: Safety/medical concerns          Walk 10 feet activity   Assist  Walk 10 feet activity did not occur: Safety/medical concerns        Walk 50 feet activity   Assist Walk 50 feet with 2 turns activity did not occur: Safety/medical concerns         Walk 150 feet activity   Assist Walk 150 feet activity did not occur: Safety/medical concerns         Walk 10 feet on uneven surface  activity   Assist Walk 10 feet on uneven surfaces activity did not occur: Safety/medical concerns         Wheelchair     Assist Will patient use wheelchair at discharge?: Yes Type of Wheelchair: Manual    Wheelchair assist level: Supervision/Verbal cueing Max wheelchair distance: 129ft    Wheelchair 50 feet with 2 turns activity    Assist        Assist Level: Supervision/Verbal cueing   Wheelchair 150 feet activity     Assist      Assist Level: Supervision/Verbal cueing   Medical Problem List and Plan: 1.  Impaired mobility and ADLs secondary to L BKA  Continue CIR 2.  Antithrombotics: -DVT/anticoagulation:  Pharmaceutical: Lovenox             -antiplatelet therapy: N/A 3. Chronic pain/Pain Management: Was on oxycodone 5 mg tid prn as well Cymbalta for neuropathy (at baseline)- now on 10-15 mg q4 hours prn  12/2- will start MS Contin 15 mg BID  12/3- MUCH improved pain for pt- con't MS Contin 4. Mood: Team to provide ego support/encouragement.              -antipsychotic agents: N/A 5. Neuropsych: This patient is capable of making decisions on her own behalf. 6. Skin/Wound Care: Monitor wound daily   Continue stump shrinker   11/30- will call Vascular on Thursday about getting sutures out of L BKA- has been 4 weeks.   12/3- L/M- with vascular 7. Fluids/Electrolytes/Nutrition: Strict I/O. 1200 cc FR.  Will consult dietician to educate on renal diet.  8. Left hip ORIF/hip abscess s/p I&D 11/18: NWB LLE.   Flagyl changed to Ertepenem due to patient refusal, continue  12/1- on Ertepenem until end of December.  9. 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 T1DM with neuropathy: Hgb A1c-6.2. Was on Lantus 50 units in am with 10 units novolog tid.     Lantus 38 twice daily, decreased to 10 on 11/27, increased to 15 on 11/28  NovoLog 5 units with breakfast, 8 units with lunch and dinner, decreased to 5 3 times daily on 11/27, DC'd on 11/27.  12/3- BGs 96-171- con't regimen 11. Now ESRD: Now on HD MWF (set up at [facility] post discharge)  12. Anemia of chronic disease/iron deficiency: Hgb-6.3 at admission--->9.1 s/p 4 units PRBC. Feraheme X 1 (additional dose held due to infection) and Aranesp weekly.  Hemoglobin 9.7 on 11/26  Continue to monitor 13. HTN: Monitor BP tid--continue Coreg bid, Norvasc and apresoline tid.   Decreased Coreg to 12.5mg  BID.   12/3- BP is better 016W systolic and pulse 10-93 14. IBS-D:   MiraLAX DC'd  Resumed Lotrenex    Colace as needed ordered  12/2- will make colace scheduled and add sorbitol if no BM by 3pm  12/3- Had BM this AM- might have to increase bowel meds due to starting MS Contin 15. Malnutrition: Continue protein supplement.  16. Abdominal pain: Due to diarrhea? PPI resumed-->patient to have family bring in [place] from home.  17.  Sleep disturbance:   Low dose elavil at bedtime.  Melatonin added on 11/26  Improving overall 18. Leukocytosis:   WBCs 11.6 on 11/26, labs with HD  See #8, #9  Afebrile  12/3- WBC 11.1- afebrile; no Sx's- con't to monitor  LOS: 11 days A FACE TO FACE EVALUATION WAS PERFORMED  [name] 02/03/2020, 8:20 AM

## 2020-02-03 NOTE — Progress Notes (Signed)
Occupational Therapy Session Note  Patient Details  Name: NESREEN ALBANO MRN: [id number] Date of Birth: 16-Jun-1965  Today's Date: 02/03/2020 OT Individual Time: 0700-0800 OT Individual Time Calculation (min): 60 min    Short Term Goals: Week 1:  OT Short Term Goal 1 (Week 1): Pt will transfer with 1 assist in prep for toilet transfer and to decrease BOC OT Short Term Goal 1 - Progress (Week 1): Met OT Short Term Goal 2 (Week 1): Pt will perform LB dressing with Max A of 1 OT Short Term Goal 2 - Progress (Week 1): Met OT Short Term Goal 3 (Week 1): Pt will perform LB bathing with AE PRN with Mod A OT Short Term Goal 3 - Progress (Week 1): Met OT Short Term Goal 4 (Week 1): Pt will perform ADL activity for 10 mins without fatigue OT Short Term Goal 4 - Progress (Week 1): Met  Skilled Therapeutic Interventions/Progress Updates:    1:1. Pt received in bed agreeable to OT with 9/10 pain in L hip and RN delivers medicaiton. Pt agreeable to attempt to toilet and have BM. Pt completes lateral scoot transfer with SB to/from [facility] with MIN A for SB placement and steadying as pt scoots across with VC for head hips relationship. Pt successful BM on toilet after coaching through Lateral leans to manage clothing prior to toileting.A proved for hygiene. Pt scoots back to bed with pillow case on SB for dressing. Pt provided with disposable clothing for dressing EOB/bathing. Stool utilized for LB dressing for easier access to thread RLE first. OT demo sock aide and reacher use and pt able to change socks. groomin at EOB with set up. Exited session with pt seated in bed, exit alarm on and call light in reach   Therapy Documentation Precautions:  Precautions Precautions: Fall Precaution Comments: new L BKA Required Braces or Orthoses: Other Brace Other Brace: L LE limb guard Restrictions Weight Bearing Restrictions: Yes LLE Weight Bearing: Non weight bearing General:   Vital Signs: Therapy  Vitals Temp: 98 F (36.7 C) Pulse Rate: (!) 58 BP: (!) 134/54 Patient Position (if appropriate): Lying Oxygen Therapy SpO2: 97 % O2 Device: Room Air Pain: Pain Assessment Pain Scale: 0-10 Pain Score: Asleep ADL: ADL Upper Body Bathing: Contact guard Lower Body Bathing: Maximal assistance Where Assessed-Lower Body Bathing: Bed level Upper Body Dressing: Minimal assistance Where Assessed-Upper Body Dressing: Edge of bed Lower Body Dressing: Dependent Where Assessed-Lower Body Dressing: Bed level Toileting: Not assessed (incontinent of bowel, did not need to urinate) Vision   Perception    Praxis   Exercises:   Other Treatments:     Therapy/Group: Individual Therapy  Tonny Branch 02/03/2020, 6:52 AM

## 2020-02-03 NOTE — Progress Notes (Signed)
Physical Therapy Session Note  Patient Details  Name: [name] MRN: [id number] Date of Birth: 1965/03/18  Today's Date: 02/03/2020 PT Individual Time: 1115-1200 PT Individual Time Calculation (min): 45 min   Short Term Goals: Week 2:  PT Short Term Goal 1 (Week 2): Patient will demonstrate standing balance with CGA x 2 minutes with 1-2 UE support in prep for standing ADL. PT Short Term Goal 2 (Week 2): Patient will perform slide board transfers with close S and min cues including w/c set up and board plaement. PT Short Term Goal 3 (Week 2): Patient will perform sit to stand consistently with mod A. PT Short Term Goal 4 (Week 2): Patient to initiate gait traning in parallel bars with mod A  Skilled Therapeutic Interventions/Progress Updates:    Pt in bed declines OOB mobility due to increased pain in LLE and HD this afternoon. Agreeable to bed level therex for increased ROM and functional strengthening including:  3# ankle weight on RLE:  10 reps x 2 sets of heel slides  10 reps x 2 sets of hip abduction/adduction  10 reps x 2 sets of SAQ with 3-5 sec hold  LLE  10 reps x 2 sets of active assisted hip flexion/knee flexion  10 reps x 2 sets of quad sets with 3-5 sec hold   3# dumbbell BUE's:  10 reps of bicep curls x 2 sets  4# straightweight BUE  Overhead press x 10 reps x 2 sets  Chest press x 10 reps x 2 sets  Amputee education provided throughout during rest breaks.     Therapy Documentation Precautions:  Precautions Precautions: Fall Precaution Comments: new L BKA Required Braces or Orthoses: Other Brace Other Brace: L LE limb guard Restrictions Weight Bearing Restrictions: Yes LLE Weight Bearing: Non weight bearing    Pain: Reports 9/10 pain in LLE (hip) - premedicated and using ice packs. Declined upright mobility or OOB due to pain.     Therapy/Group: Individual Therapy  Canary Brim Ivory Broad, PT, DPT, CBIS  02/03/2020, 12:07 PM

## 2020-02-03 NOTE — Progress Notes (Addendum)
[facility] Progress Note   Subjective: Seen in room talking with PT. No C/Os. HD today on schedule.   Objective Vitals:   02/02/20 1311 02/02/20 1943 02/03/20 0500 02/03/20 0523  BP: (!) 140/55 (!) 115/50  (!) 134/54  Pulse: 62 61  (!) 58  Resp: 15 20    Temp: 98.9 F (37.2 C) 98.5 F (36.9 C)  98 F (36.7 C)  TempSrc: Oral     SpO2: 96% 96%  97%  Weight:   90.1 kg   Height:       Physical Exam General:Pleasant female in NAD Heart:S1,S2 RRR Lungs:CTAB Abdomen:S, NT Extremities:L BKA. Trace-1+ RLE edema Dialysis Access:RIJ TDC Drsg CDI. Maturing L AVF + T/B.    Additional Objective Labs: Basic Metabolic Panel: Recent Labs  Lab 01/27/20 1444 01/30/20 1343 02/01/20 1331  NA 127* 129* 130*  K 4.3 4.4 4.1  CL 91* 93* 94*  CO2 '26 25 26  ' GLUCOSE 111* 207* 131*  BUN 51* 55* 46*  CREATININE 7.03* 7.35* 6.40*  CALCIUM 8.6* 8.0* 8.5*  PHOS 5.0* 5.7* 4.9*   Liver Function Tests: Recent Labs  Lab 01/27/20 1444 01/30/20 1343 02/01/20 1331  ALBUMIN 1.9* 1.9* 2.0*   No results for input(s): LIPASE, AMYLASE in the last 168 hours. CBC: Recent Labs  Lab 01/27/20 1421 01/30/20 1343 02/01/20 1332  WBC 11.6* 10.3 11.1*  HGB 9.7* 9.2* 9.5*  HCT 31.6* 30.2* 30.9*  MCV 91.3 90.7 89.8  PLT 311 302 295   Blood Culture    Component Value Date/Time   SDES ABSCESS LEFT HIP 01/19/2020 1424   SPECREQUEST PT ON ANCEF 01/19/2020 1424   CULT  01/19/2020 1424    RARE ENTEROBACTER CLOACAE MODERATE BACTEROIDES FRAGILIS BETA LACTAMASE POSITIVE Performed at [facility], [place] [address], [place], [place] [address]    REPTSTATUS 01/22/2020 FINAL 01/19/2020 1424    Cardiac Enzymes: No results for input(s): CKTOTAL, CKMB, CKMBINDEX, TROPONINI in the last 168 hours. CBG: Recent Labs  Lab 02/02/20 0533 02/02/20 1127 02/02/20 1644 02/02/20 2103 02/03/20 0554  GLUCAP 96 160* 171* 169* 105*   Iron Studies: No results for input(s): IRON,  TIBC, TRANSFERRIN, FERRITIN in the last 72 hours. '@lablastinr3' @ Studies/Results: No results found. Medications: . sodium chloride Stopped (01/30/20 2014)  . ertapenem 500 mg (02/02/20 1741)   . (feeding supplement) PROSource Plus  30 mL Oral BID BM  . alosetron  0.5 mg Oral BID  . amitriptyline  10 mg Oral QHS  . amLODipine  10 mg Oral Daily  . atorvastatin  40 mg Oral q1800  . carvedilol  12.5 mg Oral BID WC  . Chlorhexidine Gluconate Cloth  6 each Topical Q0600  . darbepoetin (ARANESP) injection - DIALYSIS  100 mcg Intravenous Q Mon-HD  . docusate sodium  100 mg Oral BID  . DULoxetine  60 mg Oral Daily  . enoxaparin (LOVENOX) injection  30 mg Subcutaneous Q24H  . hydrALAZINE  100 mg Oral TID  . insulin aspart  0-5 Units Subcutaneous QHS  . insulin aspart  0-9 Units Subcutaneous TID WC  . insulin glargine  15 Units Subcutaneous QHS  . melatonin  1.5 mg Oral QHS  . morphine  15 mg Oral Q12H  . multivitamin  1 tablet Oral QHS  . pantoprazole  40 mg Oral Daily  . sevelamer carbonate  800 mg Oral TID WC     Dialysis Orders:new start  Assessment/Plan: 1. AoCKD 5,now ESRD: Baseline Creat 4.9- 5.2, eGFR 9-11  from 8/21 - 10/21. Creat 6 on admission, then mid 7's, IVF's given w/o improvementso started HD.Has been clipped to [facility].S/p [facility] and AVF on11/8, 1st HD 11/9. Continue MWF schedule-Next HD 02/03/2020 on schedule. 2. Anemiaof ESRD- Hgb6.3 on admit. S/p 4U PRBCs.Tsat low 10%, got 59m Feraheme on 11/6, holding further doses due to infection. IncreasedAranesp 60 to 100 q week on 11/17.Last Hgb 9.5. 3. 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 L hip fracture - admission diagnosis.S/p ORIF on 10/28 per ortho.S/p I&D surgical wound 11/18.Wasrefusing Flagyl, now looks like on Ertapenem. 5. HTN/ vol - BP's are stable,on hydralazine, amlodipine,carvedilol was decreased then stopped due to bradycardia, but has now  been restarted. Withhyponatremia and edema,increasing UF goals with HDand reviewed fluid restrictions.Continue to UF as tolerated. 6. DM - on insulin, per pmd 7. BMM -CorrCa high initially - now improved. No VDRA.Started binder this admission and phos at goal now. PTH 44.  8. Nutrition: albumin very low, on protein supplement.Renal diet w/fluid restrictions.  Auden Tatar H. Stevie Charter NP-C 02/03/2020, 11:20 AM  [phone]

## 2020-02-03 NOTE — Progress Notes (Addendum)
Physical Therapy Session Note  Patient Details  Name: [name] MRN: [id number] Date of Birth: [birth year]  Today's Date: 02/03/2020 PT Individual Time: 2482-5003 PT Individual Time Calculation (min): 75 min   Short Term Goals: Week 2:  PT Short Term Goal 1 (Week 2): Patient will demonstrate standing balance with CGA x 2 minutes with 1-2 UE support in prep for standing ADL. PT Short Term Goal 2 (Week 2): Patient will perform slide board transfers with close S and min cues including w/c set up and board plaement. PT Short Term Goal 3 (Week 2): Patient will perform sit to stand consistently with mod A. PT Short Term Goal 4 (Week 2): Patient to initiate gait traning in parallel bars with mod A  Skilled Therapeutic Interventions/Progress Updates:    Patient in supine reports still with lot of pain L hip.  RN informed pt needing muscle relaxer and gave her during session.  States wants to progress with LE strengthening.  PAtient performed in bed therex consisting of R single limb bridge, R ankle pumps, glut squeezes, isometric hip adduction, L SAQ, B SLR, hip abduction, and L sidelying hip abduction AAROM.  Issued supine HEP for pt to work on over weekend.  Performed supine to sit with S from lower HOB elevation using rails with incresaed time.  Patient transferred to w/c with mod cues and min A for slide board placement, assist for w/c set up and close S.  Min A to place legrest and armrest on w/c.  Propelled to therapy gym with S.  Min A and mod cues for w/c set up, pt placed sliding board with cues and increased time.  Transfer via scoot/squat pivot after demonstration with mod A, increased time and pain complaints.  Patient sit to stand from elevated mat with mod A x 2 after donning limb guard, but guard fell off in standing.  Performed standing mini-squats on R x 5-8 reps each time.  Pt noticing L knee bending involuntarily in standing after limb guard fell off.  Patient transferred to w/c via  SBT with close S.  Min A for w/c set up and pt propelled to room.  SBT to bed with A for set up with S.  Sit to supine S.  Left in supine with bed alarm activated, ice to L hip and needs in reach.   Therapy Documentation Precautions:  Precautions Precautions: Fall Precaution Comments: new L BKA Required Braces or Orthoses: Other Brace Other Brace: L LE limb guard Restrictions Weight Bearing Restrictions: Yes LLE Weight Bearing: Non weight bearing Pain: Pain Assessment Pain Scale: 0-10 Pain Score: 9  Pain Type: Acute pain Pain Location: Hip Pain Orientation: Left Pain Descriptors / Indicators: Aching Pain Intervention(s): Relaxation;Rest    Therapy/Group: Individual Therapy  Reginia Naas  Lyman, PT 02/03/2020, 8:40 AM

## 2020-02-04 DIAGNOSIS — E1065 Type 1 diabetes mellitus with hyperglycemia: Secondary | ICD-10-CM | POA: Diagnosis not present

## 2020-02-04 DIAGNOSIS — I739 Peripheral vascular disease, unspecified: Secondary | ICD-10-CM | POA: Diagnosis not present

## 2020-02-04 DIAGNOSIS — S88112A Complete traumatic amputation at level between knee and ankle, left lower leg, initial encounter: Secondary | ICD-10-CM | POA: Diagnosis not present

## 2020-02-04 DIAGNOSIS — N186 End stage renal disease: Secondary | ICD-10-CM | POA: Diagnosis not present

## 2020-02-04 LAB — GLUCOSE, CAPILLARY
Glucose-Capillary: 123 mg/dL — ABNORMAL HIGH (ref 70–99)
Glucose-Capillary: 147 mg/dL — ABNORMAL HIGH (ref 70–99)
Glucose-Capillary: 149 mg/dL — ABNORMAL HIGH (ref 70–99)
Glucose-Capillary: 139 mg/dL — ABNORMAL HIGH (ref 70–99)

## 2020-02-04 MED ORDER — SENNOSIDES-DOCUSATE SODIUM 8.6-50 MG PO TABS
1.0000 | ORAL_TABLET | Freq: Two times a day (BID) | ORAL | Status: DC
  Administered 2020-02-04 – 2020-02-09 (×5): 1 via ORAL
  Filled 2020-02-04 (×19): qty 1

## 2020-02-04 NOTE — Plan of Care (Signed)
  Problem: RH SAFETY Goal: RH STG ADHERE TO SAFETY PRECAUTIONS W/ASSISTANCE/DEVICE Description: STG Adhere to Safety Precautions With cues/reminders Assistance/Device. Outcome: Progressing   Problem: RH PAIN MANAGEMENT Goal: RH STG PAIN MANAGED AT OR BELOW PT'S PAIN GOAL Description: Pain at or below level 4 Outcome: Progressing

## 2020-02-04 NOTE — Progress Notes (Signed)
[facility] PHYSICAL MEDICINE & REHABILITATION PROGRESS NOTE   Subjective/Complaints:  No new complaints. Has some back pain. Good appetite  ROS: Patient denies fever, rash, sore throat, blurred vision, nausea, vomiting, diarrhea, cough, shortness of breath or chest pain,   headache, or mood change.   Objective:   No results found. Recent Labs    02/01/20 1332 02/03/20 1324  WBC 11.1* 11.3*  HGB 9.5* 9.6*  HCT 30.9* 31.4*  PLT 295 285   Recent Labs    02/01/20 1331 02/03/20 1324  NA 130* 128*  K 4.1 3.9  CL 94* 90*  CO2 26 26  GLUCOSE 131* 136*  BUN 46* 44*  CREATININE 6.40* 6.61*  CALCIUM 8.5* 8.6*    Intake/Output Summary (Last 24 hours) at 02/04/2020 0753 Last data filed at 02/03/2020 1650 Gross per 24 hour  Intake 240 ml  Output 3500 ml  Net -3260 ml     Pressure Injury 01/23/20 Buttocks Left Deep Tissue Pressure Injury - Purple or maroon localized area of discolored intact skin or blood-filled blister due to damage of underlying soft tissue from pressure and/or shear. Circular, dark, red area, surrounde (Active)  01/23/20 1609  Location: Buttocks  Location Orientation: Left  Staging: Deep Tissue Pressure Injury - Purple or maroon localized area of discolored intact skin or blood-filled blister due to damage of underlying soft tissue from pressure and/or shear.  Wound Description (Comments): Circular, dark, red area, surrounded by MASD  Present on Admission: Yes    Physical Exam: Vital Signs Blood pressure (!) 146/58, pulse 63, temperature 98.4 F (36.9 C), temperature source Oral, resp. rate 16, height 5\' 10"  (1.778 m), weight 87.7 kg, SpO2 98 %. Constitutional: No distress . Vital signs reviewed. HEENT: EOMI, oral membranes moist Neck: supple Cardiovascular: RRR without murmur. No JVD    Respiratory/Chest: CTA Bilaterally without wheezes or rales. Normal effort    GI/Abdomen: BS +, non-tender, non-distended Ext: no clubbing, cyanosis, or edema Psych:  pleasant and cooperative Skin: Warm and dry.  L BKA-  Well shaped with shrinker.  L hip looks great- sutures out- steristrips in place Musc: Left BKA with mild edema and tenderness Right lower extremity edema, stable Neuro: Alert Motor: Right lower extremity: Hip flexion, knee extension 4-5/5 (pain inhibition), ankle dorsiflexion 4+/5, stable exam Left lower extremity: Hip flexion, knee extension 3/5 (some pain inhibition), ankle dorsiflexion 4/5   Assessment/Plan: 1. Functional deficits which require 3+ hours per day of interdisciplinary therapy in a comprehensive inpatient rehab setting.  Physiatrist is providing close team supervision and 24 hour management of active medical problems listed below.  Physiatrist and rehab team continue to assess barriers to discharge/monitor patient progress toward functional and medical goals  Care Tool:  Bathing    Body parts bathed by patient: Right arm, Left arm, Chest, Abdomen, Front perineal area, Face, Right upper leg, Left upper leg   Body parts bathed by helper: Buttocks, Right lower leg (RLE for thoroughness) Body parts n/a: Left lower leg   Bathing assist Assist Level: Minimal Assistance - Patient > 75% (sink level UB, bed level LB)     Upper Body Dressing/Undressing Upper body dressing   What is the patient wearing?: Pull over shirt    Upper body assist Assist Level: Contact Guard/Touching assist    Lower Body Dressing/Undressing Lower body dressing      What is the patient wearing?: Pants, Underwear/pull up     Lower body assist Assist for lower body dressing: Minimal Assistance - Patient >  75% (brief bed level rolling, pants EOB with lateral leans)     Toileting Toileting Toileting Activity did not occur Landscape architect and hygiene only): N/A (no void or bm)  Toileting assist Assist for toileting: Independent with assistive device Assistive Device Comment: female urinal   Transfers Chair/bed transfer  Transfers  assist  Chair/bed transfer activity did not occur: Safety/medical concerns  Chair/bed transfer assist level: Minimal Assistance - Patient > 75% Chair/bed transfer assistive device: Sliding board   Locomotion Ambulation   Ambulation assist   Ambulation activity did not occur: Safety/medical concerns          Walk 10 feet activity   Assist  Walk 10 feet activity did not occur: Safety/medical concerns        Walk 50 feet activity   Assist Walk 50 feet with 2 turns activity did not occur: Safety/medical concerns         Walk 150 feet activity   Assist Walk 150 feet activity did not occur: Safety/medical concerns         Walk 10 feet on uneven surface  activity   Assist Walk 10 feet on uneven surfaces activity did not occur: Safety/medical concerns         Wheelchair     Assist Will patient use wheelchair at discharge?: Yes Type of Wheelchair: Manual    Wheelchair assist level: Supervision/Verbal cueing Max wheelchair distance: 166ft    Wheelchair 50 feet with 2 turns activity    Assist        Assist Level: Supervision/Verbal cueing   Wheelchair 150 feet activity     Assist      Assist Level: Supervision/Verbal cueing   Medical Problem List and Plan: 1.  Impaired mobility and ADLs secondary to L BKA  Continue CIR 2.  Antithrombotics: -DVT/anticoagulation:  Pharmaceutical: Lovenox             -antiplatelet therapy: N/A 3. Chronic pain/Pain Management: Was on oxycodone 5 mg tid prn as well Cymbalta for neuropathy (at baseline)- now on 10-15 mg q4 hours prn  12/2- will start MS Contin 15 mg BID  12/3-4- MUCH improved pain for pt- con't MS Contin 4. Mood: Team to provide ego support/encouragement.              -antipsychotic agents: N/A 5. Neuropsych: This patient is capable of making decisions on her own behalf. 6. Skin/Wound Care: Monitor wound daily   Continue stump shrinker   11/30- will call Vascular on Thursday about  getting sutures out of L BKA- has been 4 weeks.   12/3- L/M- with vascular  12/4 vascular hasn't seen pt yet 7. Fluids/Electrolytes/Nutrition: Strict I/O. 1200 cc FR. Will consult dietician to educate on renal diet.  8. Left hip ORIF/hip abscess s/p I&D 11/18: NWB LLE.   Flagyl changed to Ertepenem due to patient refusal, continue  12/1- on Ertepenem until end of December.  9. 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 T1DM with neuropathy: Hgb A1c-6.2. Was on Lantus 50 units in am with 10 units novolog tid.     Lantus 38 twice daily, decreased to 10 on 11/27, increased to 15 on 11/28  NovoLog 5 units with breakfast, 8 units with lunch and dinner, decreased to 5 3 times daily on 11/27, DC'd on 11/27.  12/4- BGs under reasonable control 11. Now ESRD: Now on HD MWF (set up at [place] post discharge)  12. Anemia of chronic disease/iron  deficiency: Hgb-6.3 at admission--->9.1 s/p 4 units PRBC. Feraheme X 1 (additional dose held due to infection) and Aranesp weekly.   Hemoglobin 9.7 on 11/26  Continue to monitor 13. HTN: Monitor BP tid--continue Coreg bid, Norvasc and apresoline tid.   Decreased Coreg to 12.5mg  BID.   12/4- BP controlled 14. IBS-D:   MiraLAX DC'd  Resumed Lotrenex    Colace as needed ordered  12/2- will make colace scheduled and add sorbitol if no BM by 3pm  12/4 stool hard, will change colace to senna-s given addn of MS Contin 15. Malnutrition: Continue protein supplement.  16. Abdominal pain: Due to diarrhea? PPI resumed-->patient to have family bring in [place] from home.  17.  Sleep disturbance:   Low dose elavil at bedtime.  Melatonin added on 11/26  Improving overall 18. Leukocytosis:   WBCs 11.6 on 11/26, labs with HD  See #8, #9  Afebrile  12/3- WBC 11.1- afebrile; no Sx's- con't to monitor  LOS: 12 days A FACE TO [place] 02/04/2020, 7:53 AM

## 2020-02-05 ENCOUNTER — Inpatient Hospital Stay (HOSPITAL_COMMUNITY): Payer: Medicaid Other | Admitting: Physical Therapy

## 2020-02-05 DIAGNOSIS — S88112A Complete traumatic amputation at level between knee and ankle, left lower leg, initial encounter: Secondary | ICD-10-CM | POA: Diagnosis not present

## 2020-02-05 DIAGNOSIS — I739 Peripheral vascular disease, unspecified: Secondary | ICD-10-CM | POA: Diagnosis not present

## 2020-02-05 DIAGNOSIS — N186 End stage renal disease: Secondary | ICD-10-CM | POA: Diagnosis not present

## 2020-02-05 DIAGNOSIS — E1065 Type 1 diabetes mellitus with hyperglycemia: Secondary | ICD-10-CM | POA: Diagnosis not present

## 2020-02-05 LAB — GLUCOSE, CAPILLARY
Glucose-Capillary: 123 mg/dL — ABNORMAL HIGH (ref 70–99)
Glucose-Capillary: 110 mg/dL — ABNORMAL HIGH (ref 70–99)
Glucose-Capillary: 127 mg/dL — ABNORMAL HIGH (ref 70–99)
Glucose-Capillary: 141 mg/dL — ABNORMAL HIGH (ref 70–99)

## 2020-02-05 NOTE — Progress Notes (Signed)
[facility] PHYSICAL MEDICINE & REHABILITATION PROGRESS NOTE   Subjective/Complaints:  Pain better controlled. Just had bm. Slept well.   ROS: Patient denies fever, rash, sore throat, blurred vision, nausea, vomiting, diarrhea, cough, shortness of breath or chest pain,  headache, or mood change.   Objective:   No results found. Recent Labs    02/03/20 1324  WBC 11.3*  HGB 9.6*  HCT 31.4*  PLT 285   Recent Labs    02/03/20 1324  NA 128*  K 3.9  CL 90*  CO2 26  GLUCOSE 136*  BUN 44*  CREATININE 6.61*  CALCIUM 8.6*    Intake/Output Summary (Last 24 hours) at 02/05/2020 0813 Last data filed at 02/04/2020 1745 Gross per 24 hour  Intake 360 ml  Output --  Net 360 ml     Pressure Injury 01/23/20 Buttocks Left Deep Tissue Pressure Injury - Purple or maroon localized area of discolored intact skin or blood-filled blister due to damage of underlying soft tissue from pressure and/or shear. Circular, dark, red area, surrounde (Active)  01/23/20 1609  Location: Buttocks  Location Orientation: Left  Staging: Deep Tissue Pressure Injury - Purple or maroon localized area of discolored intact skin or blood-filled blister due to damage of underlying soft tissue from pressure and/or shear.  Wound Description (Comments): Circular, dark, red area, surrounded by MASD  Present on Admission: Yes    Physical Exam: Vital Signs Blood pressure (!) 134/57, pulse (!) 57, temperature 98 F (36.7 C), temperature source Oral, resp. rate 16, height 5\' 10"  (1.778 m), weight 87.8 kg, SpO2 95 %. Constitutional: No distress . Vital signs reviewed. HEENT: EOMI, oral membranes moist Neck: supple Cardiovascular: RRR without murmur. No JVD    Respiratory/Chest: CTA Bilaterally without wheezes or rales. Normal effort    GI/Abdomen: BS +, non-tender, non-distended Ext: no clubbing, cyanosis, or edema Psych: pleasant and cooperative Skin: Warm and dry.  L BKA-  Well shaped with shrinker.  L hip looks  great- sutures out-steristrips Musc: Left BKA with mild edema and tenderness Right lower extremity edema, stable Neuro: Alert Motor: Right lower extremity: Hip flexion, knee extension 4-5/5 (pain inhibition), ankle dorsiflexion 4+/5, stable exam Left lower extremity: Hip flexion, knee extension 3/5 (some pain inhibition), ankle dorsiflexion 4/5   Assessment/Plan: 1. Functional deficits which require 3+ hours per day of interdisciplinary therapy in a comprehensive inpatient rehab setting.  Physiatrist is providing close team supervision and 24 hour management of active medical problems listed below.  Physiatrist and rehab team continue to assess barriers to discharge/monitor patient progress toward functional and medical goals  Care Tool:  Bathing    Body parts bathed by patient: Right arm, Left arm, Chest, Abdomen, Front perineal area, Face, Right upper leg, Left upper leg   Body parts bathed by helper: Buttocks, Right lower leg (RLE for thoroughness) Body parts n/a: Left lower leg   Bathing assist Assist Level: Minimal Assistance - Patient > 75% (sink level UB, bed level LB)     Upper Body Dressing/Undressing Upper body dressing   What is the patient wearing?: Pull over shirt    Upper body assist Assist Level: Contact Guard/Touching assist    Lower Body Dressing/Undressing Lower body dressing      What is the patient wearing?: Pants, Underwear/pull up     Lower body assist Assist for lower body dressing: Minimal Assistance - Patient > 75% (brief bed level rolling, pants EOB with lateral leans)     Toileting Toileting Toileting Activity did  not occur Landscape architect and hygiene only): N/A (no void or bm)  Toileting assist Assist for toileting: Independent with assistive device Assistive Device Comment: female urinal   Transfers Chair/bed transfer  Transfers assist  Chair/bed transfer activity did not occur: Safety/medical concerns  Chair/bed transfer assist  level: Minimal Assistance - Patient > 75% Chair/bed transfer assistive device: Sliding board   Locomotion Ambulation   Ambulation assist   Ambulation activity did not occur: Safety/medical concerns          Walk 10 feet activity   Assist  Walk 10 feet activity did not occur: Safety/medical concerns        Walk 50 feet activity   Assist Walk 50 feet with 2 turns activity did not occur: Safety/medical concerns         Walk 150 feet activity   Assist Walk 150 feet activity did not occur: Safety/medical concerns         Walk 10 feet on uneven surface  activity   Assist Walk 10 feet on uneven surfaces activity did not occur: Safety/medical concerns         Wheelchair     Assist Will patient use wheelchair at discharge?: Yes Type of Wheelchair: Manual    Wheelchair assist level: Supervision/Verbal cueing Max wheelchair distance: 154ft    Wheelchair 50 feet with 2 turns activity    Assist        Assist Level: Supervision/Verbal cueing   Wheelchair 150 feet activity     Assist      Assist Level: Supervision/Verbal cueing   Medical Problem List and Plan: 1.  Impaired mobility and ADLs secondary to L BKA  Continue CIR 2.  Antithrombotics: -DVT/anticoagulation:  Pharmaceutical: Lovenox             -antiplatelet therapy: N/A 3. Chronic pain/Pain Management: Was on oxycodone 5 mg tid prn as well Cymbalta for neuropathy (at baseline)- now on 10-15 mg q4 hours prn  12/2- will start MS Contin 15 mg BID  12/3-5- MUCH improved pain for pt- con't MS Contin 4. Mood: Team to provide ego support/encouragement.              -antipsychotic agents: N/A 5. Neuropsych: This patient is capable of making decisions on her own behalf. 6. Skin/Wound Care: Monitor wound daily   Continue stump shrinker   11/30- will call Vascular on Thursday about getting sutures out of L BKA- has been 4 weeks.   12/3- L/M- with vascular  12/5 vascular hasn't seen  pt yet--primary team will need to reach out again tomorrow 7. Fluids/Electrolytes/Nutrition: Strict I/O. 1200 cc FR. Will consult dietician to educate on renal diet.  8. Left hip ORIF/hip abscess s/p I&D 11/18: NWB LLE.   Flagyl changed to Ertepenem due to patient refusal, continue  12/1- on Ertepenem until end of December.  9. 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 T1DM with neuropathy: Hgb A1c-6.2. Was on Lantus 50 units in am with 10 units novolog tid.     Lantus 38 twice daily, decreased to 10 on 11/27, increased to 15 on 11/28  NovoLog 5 units with breakfast, 8 units with lunch and dinner, decreased to 5 3 times daily on 11/27, DC'd on 11/27.  12/5- CBGs under reasonable control 11. Now ESRD: Now on HD MWF (set up at [facility] post discharge)  12. Anemia of chronic disease/iron deficiency: Hgb-6.3 at admission--->9.1 s/p 4 units PRBC. Feraheme X 1 (  additional dose held due to infection) and Aranesp weekly.   Hemoglobin 9.7 on 11/26  Continue to monitor 13. HTN: Monitor BP tid--continue Coreg bid, Norvasc and apresoline tid.   Decreased Coreg to 12.5mg  BID.   12/5 BP controlled 14. IBS-D:   MiraLAX DC'd  Resumed Lotrenex    Colace as needed ordered  12/2- will make colace scheduled and add sorbitol if no BM by 3pm  12/4 changed colace to senna-s given addn of MS Contin  12/5 had bm this morning 15. Malnutrition: Continue protein supplement.  16. Abdominal pain: Due to diarrhea? PPI resumed-->patient to have family bring in Sharon from home.  17.  Sleep disturbance:   Low dose elavil at bedtime.  Melatonin added on 11/26  Improving overall 18. Leukocytosis:   WBCs 11.6 on 11/26, labs with HD  See #8, #9  Afebrile  12/3- WBC 11.1- afebrile; no Sx's- con't to monitor  LOS: 13 days A FACE TO [place] 02/05/2020, 8:13 AM

## 2020-02-05 NOTE — Progress Notes (Signed)
[name] Progress Note   Subjective: Seen in room, no new c/o's.   Objective Vitals:   02/04/20 2031 02/05/20 0450 02/05/20 0739 02/05/20 1336  BP: (!) 127/52 (!) 137/54 (!) 134/57 (!) 110/50  Pulse:  61 (!) 57 (!) 59  Resp:  16  17  Temp:  98 F (36.7 C)  98.9 F (37.2 C)  TempSrc:  Oral    SpO2:  96% 95% 96%  Weight:  87.8 kg    Height:       Physical Exam General:Pleasant female in NAD Heart:S1,S2 RRR Lungs:CTAB Abdomen:S, NT Extremities:L BKA. Trace-1+ RLE edema Dialysis Access:RIJ TDC Drsg CDI. Maturing L AVF + T/B.    Additional Objective Labs: Basic Metabolic Panel: Recent Labs  Lab 01/30/20 1343 02/01/20 1331 02/03/20 1324  NA 129* 130* 128*  K 4.4 4.1 3.9  CL 93* 94* 90*  CO2 '25 26 26  ' GLUCOSE 207* 131* 136*  BUN 55* 46* 44*  CREATININE 7.35* 6.40* 6.61*  CALCIUM 8.0* 8.5* 8.6*  PHOS 5.7* 4.9* 5.2*   Liver Function Tests: Recent Labs  Lab 01/30/20 1343 02/01/20 1331 02/03/20 1324  ALBUMIN 1.9* 2.0* 2.0*   No results for input(s): LIPASE, AMYLASE in the last 168 hours. CBC: Recent Labs  Lab 01/30/20 1343 02/01/20 1332 02/03/20 1324  WBC 10.3 11.1* 11.3*  HGB 9.2* 9.5* 9.6*  HCT 30.2* 30.9* 31.4*  MCV 90.7 89.8 90.2  PLT 302 295 285   Blood Culture    Component Value Date/Time   SDES ABSCESS LEFT HIP 01/19/2020 1424   SPECREQUEST PT ON ANCEF 01/19/2020 1424   CULT  01/19/2020 1424    RARE ENTEROBACTER CLOACAE MODERATE BACTEROIDES FRAGILIS BETA LACTAMASE POSITIVE Performed at [facility], [place] [address], [place], [place] [address]    REPTSTATUS 01/22/2020 FINAL 01/19/2020 1424    Cardiac Enzymes: No results for input(s): CKTOTAL, CKMB, CKMBINDEX, TROPONINI in the last 168 hours. CBG: Recent Labs  Lab 02/04/20 1625 02/04/20 2101 02/05/20 0607 02/05/20 1149 02/05/20 1615  GLUCAP 149* 139* 123* 110* 127*   Iron Studies: No results for input(s): IRON, TIBC, TRANSFERRIN, FERRITIN in the  last 72 hours. '@[id number]' @ Studies/Results: No results found. Medications: . sodium chloride Stopped (01/30/20 2014)  . ertapenem 500 mg (02/04/20 1719)   . (feeding supplement) PROSource Plus  30 mL Oral BID BM  . alosetron  0.5 mg Oral BID  . amitriptyline  10 mg Oral QHS  . amLODipine  10 mg Oral Daily  . atorvastatin  40 mg Oral q1800  . carvedilol  12.5 mg Oral BID WC  . Chlorhexidine Gluconate Cloth  6 each Topical Q0600  . darbepoetin (ARANESP) injection - DIALYSIS  100 mcg Intravenous Q Mon-HD  . DULoxetine  60 mg Oral Daily  . enoxaparin (LOVENOX) injection  30 mg Subcutaneous Q24H  . hydrALAZINE  100 mg Oral TID  . insulin aspart  0-5 Units Subcutaneous QHS  . insulin aspart  0-9 Units Subcutaneous TID WC  . insulin glargine  15 Units Subcutaneous QHS  . melatonin  1.5 mg Oral QHS  . morphine  15 mg Oral Q12H  . multivitamin  1 tablet Oral QHS  . pantoprazole  40 mg Oral Daily  . senna-docusate  1 tablet Oral BID  . sevelamer carbonate  800 mg Oral TID WC     Dialysis Orders:new start  Assessment/Plan: 1. AoCKD 5,now ESRD: Baseline Creat 4.9- 5.2, eGFR 9-11 from 8/21 - 10/21. Creat 6 on admission,  then mid 7's, IVF's given w/o improvementso started HD.Has been clipped to [facility].S/p [facility] and AVF on11/8, 1st HD 11/9. Continue MWF schedule-Next HD 02/03/2020 on schedule. 2. Anemiaof ESRD- Hgb6.3 on admit. S/p 4U PRBCs.Tsat low 10%, got 560m Feraheme on 11/6, holding further doses due to infection. IncreasedAranesp 60 to 100 q week on 11/17.Last Hgb 9.5. 3. 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 L hip fracture - admission diagnosis.S/p ORIF on 10/28 per ortho.S/p I&D surgical wound 11/18.Wason IV cefepime (p HD tiw) and po Flagyl per ID through late December. Then changed to ertapenem (once daily) because pt wouldn't take the po flagyl.  This could cause problems w/ outpt antibiotic dosing at time of  discharge, because cefepime will work w/ OP HD but ertapenem appears to require daily dosing. Will need to curbside ID this week to see if there is another solution.  5. HTN/ vol - BP's are stable,on hydralazine, coreg and norvasc. Fairly large ave UF 3- 3.5 L per Rx.  6. DM - on insulin, per pmd 7. BMM -CorrCa high initially - now improved. No VDRA.Started binder this admission and phos at goal now. PTH 44.  8. Nutrition: albumin very low, on protein supplement.Renal diet w/fluid restrictions.  RKelly Splinter MD 02/05/2020, 4:34 PM

## 2020-02-05 NOTE — Progress Notes (Signed)
Physical Therapy Session Note  Patient Details  Name: [name] MRN: [id number] Date of Birth: [birth year]  Today's Date: 02/05/2020 PT Individual Time: 0900-0930 PT Individual Time Calculation (min): 30 min   Short Term Goals: Week 2:  PT Short Term Goal 1 (Week 2): Patient will demonstrate standing balance with CGA x 2 minutes with 1-2 UE support in prep for standing ADL. PT Short Term Goal 2 (Week 2): Patient will perform slide board transfers with close S and min cues including w/c set up and board plaement. PT Short Term Goal 3 (Week 2): Patient will perform sit to stand consistently with mod A. PT Short Term Goal 4 (Week 2): Patient to initiate gait traning in parallel bars with mod A  Skilled Therapeutic Interventions/Progress Updates:    Pt received seated in bed, agreeable to PT session. Pt reports intermittent pain in LLE, reports being premedicated prior to start of therapy session. Use of repositioning for pain management throughout session. Pt declines any OOB mobility this date due to short therapy session, requests to focus on UE strengthening. Supine to sitting EOB at Supervision level with increased time and use of hospital bed features. Seated BUE strengthening with use of 5# dowel rod: bicep curls, chest press, OH press, L/R diagonals 2 x 10-15 reps each. Seated BLE strengthening therex: marches, LAQ x 10 reps each. Pt exhibits decreased L hip strength and ROM 2/2 pain from hip fracture. Pt returned to supine at Supervision level with use of bedrails. Pt left seated in bed with needs in reach, bed alarm in place at end of session.  Therapy Documentation Precautions:  Precautions Precautions: Fall Precaution Comments: new L BKA Required Braces or Orthoses: Other Brace Other Brace: L LE limb guard Restrictions Weight Bearing Restrictions: Yes LLE Weight Bearing: Non weight bearing    Therapy/Group: Individual Therapy   Excell Seltzer, PT, DPT  02/05/2020,  12:05 PM

## 2020-02-06 ENCOUNTER — Inpatient Hospital Stay (HOSPITAL_COMMUNITY): Payer: Medicaid Other

## 2020-02-06 ENCOUNTER — Inpatient Hospital Stay (HOSPITAL_COMMUNITY): Payer: Medicaid Other | Admitting: Occupational Therapy

## 2020-02-06 DIAGNOSIS — S88112A Complete traumatic amputation at level between knee and ankle, left lower leg, initial encounter: Secondary | ICD-10-CM | POA: Diagnosis not present

## 2020-02-06 LAB — CBC
HCT: 31.5 % — ABNORMAL LOW (ref 36.0–46.0)
Hemoglobin: 10 g/dL — ABNORMAL LOW (ref 12.0–15.0)
MCH: 28.2 pg (ref 26.0–34.0)
MCHC: 31.7 g/dL (ref 30.0–36.0)
MCV: 88.7 fL (ref 80.0–100.0)
Platelets: 287 10*3/uL (ref 150–400)
RBC: 3.55 MIL/uL — ABNORMAL LOW (ref 3.87–5.11)
RDW: 15.4 % (ref 11.5–15.5)
WBC: 12 10*3/uL — ABNORMAL HIGH (ref 4.0–10.5)
nRBC: 0 % (ref 0.0–0.2)

## 2020-02-06 LAB — RENAL FUNCTION PANEL
Albumin: 2.2 g/dL — ABNORMAL LOW (ref 3.5–5.0)
Anion gap: 10 (ref 5–15)
BUN: 52 mg/dL — ABNORMAL HIGH (ref 6–20)
CO2: 28 mmol/L (ref 22–32)
Calcium: 8.5 mg/dL — ABNORMAL LOW (ref 8.9–10.3)
Chloride: 89 mmol/L — ABNORMAL LOW (ref 98–111)
Creatinine, Ser: 7.6 mg/dL — ABNORMAL HIGH (ref 0.44–1.00)
GFR, Estimated: 6 mL/min — ABNORMAL LOW (ref >60)
Glucose, Bld: 103 mg/dL — ABNORMAL HIGH (ref 70–99)
Phosphorus: 6 mg/dL — ABNORMAL HIGH (ref 2.5–4.6)
Potassium: 4.4 mmol/L (ref 3.5–5.1)
Sodium: 127 mmol/L — ABNORMAL LOW (ref 135–145)

## 2020-02-06 LAB — GLUCOSE, CAPILLARY
Glucose-Capillary: 84 mg/dL (ref 70–99)
Glucose-Capillary: 124 mg/dL — ABNORMAL HIGH (ref 70–99)
Glucose-Capillary: 91 mg/dL (ref 70–99)
Glucose-Capillary: 111 mg/dL — ABNORMAL HIGH (ref 70–99)

## 2020-02-06 MED ORDER — IOHEXOL 9 MG/ML PO SOLN
500.0000 mL | ORAL | Status: AC
  Administered 2020-02-06 (×2): 500 mL via ORAL

## 2020-02-06 MED ORDER — IOHEXOL 9 MG/ML PO SOLN
ORAL | Status: AC
  Filled 2020-02-06: qty 500

## 2020-02-06 MED ORDER — OXYCODONE HCL 5 MG PO TABS
ORAL_TABLET | ORAL | Status: AC
  Filled 2020-02-06: qty 3

## 2020-02-06 MED ORDER — DARBEPOETIN ALFA 100 MCG/0.5ML IJ SOSY
PREFILLED_SYRINGE | INTRAMUSCULAR | Status: AC
  Filled 2020-02-06: qty 0.5

## 2020-02-06 MED ORDER — HEPARIN SODIUM (PORCINE) 1000 UNIT/ML IJ SOLN
INTRAMUSCULAR | Status: AC
  Filled 2020-02-06: qty 1

## 2020-02-06 MED ORDER — IOHEXOL 9 MG/ML PO SOLN
ORAL | Status: AC
  Filled 2020-02-06: qty 1000

## 2020-02-06 NOTE — Progress Notes (Signed)
Discussed use of dye with Dr. Carolin Sicks (nephrology)-he does not feel that patient has a chance of recovery of renal function and ESRD is a natural progression of her disease process. Ok to use dye for CT scan.

## 2020-02-06 NOTE — Progress Notes (Signed)
[facility] NEPHROLOGY PROGRESS NOTE  Assessment/ Plan: Pt is a 54 y.o. yo female with new ESRD, left foot infection is status post a BKA, left hip fracture status post ORIF, now in CIR.  # AoCKD 5,now ESRD: Baseline Creat 4.9- 5.2, eGFR 9-11 from 8/21 - 10/21. Creat 6 on admission, then mid 7's, IVF's given w/o improvementso started HD.Has been clipped to [facility].S/p [facility] and AVF on11/8, 1st HD 11/9. Continue MWF schedule-Next HD today.  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 S/p 4U PRBCs.Tsat low 10%, got 580m Feraheme on 11/6, holding further doses due to infection. IncreasedAranesp 60 to 100 q week on 11/17.Last Hgb 9.6.  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 Then changed to ertapenem (once daily) because pt wouldn't take the po flagyl.  This could cause problems w/ outpt antibiotic dosing at time of discharge, because cefepime will work w/ OP HD but ertapenem appears to require daily dosing. Will need to clarify with ID before discharge.  #HTN/ vol - BP's are stable,on hydralazine, coreg and norvasc.  UF during HD.  #BMM -CorrCa high initially - now improved. No VDRA.Started binder this admission and phos at goal now. PTH 44.   #Hyponatremia, hypervolemic: Managed with dialysis,.  #Nutrition: albumin very low, on protein supplement.Renal diet w/fluid restrictions.  Subjective: Seen and examined.  Doing inpatient rehab.  Denies nausea vomiting chest pain shortness of breath.  No new event. Objective Vital signs in last 24 hours: Vitals:   02/05/20 1336 02/05/20 1913 02/06/20 0421 02/06/20 0429  BP: (!) 110/50 (!) 132/55 (!) 143/62   Pulse: (!) 59 (!) 59 61   Resp: _0 Temp: 98.9 F (37.2 C) 98.2 F (36.8 C) 98.1 F (36.7 C)    TempSrc:  Oral Oral   SpO2: 96% 96% 95%   Weight:    89.7 kg  Height:       Weight change: 1.9 kg  Intake/Output Summary (Last 24 hours) at 02/06/2020 0957 Last data filed at 02/06/2020 0737 Gross per 24 hour  Intake 840 ml  Output --  Net 840 ml       Labs: Basic Metabolic Panel: Recent Labs  Lab 01/30/20 1343 02/01/20 1331 02/03/20 1324  NA 129* 130* 128*  K 4.4 4.1 3.9  CL 93* 94* 90*  CO2 _1 GLUCOSE 207* 131* 136*  BUN 55* 46* 44*  CREATININE 7.35* 6.40* 6.61*  CALCIUM 8.0* 8.5* 8.6*  PHOS 5.7* 4.9* 5.2*   Liver Function Tests: Recent Labs  Lab 01/30/20 1343 02/01/20 1331 02/03/20 1324  ALBUMIN 1.9* 2.0* 2.0*   No results for input(s): LIPASE, AMYLASE in the last 168 hours. No results for input(s): AMMONIA in the last 168 hours. CBC: Recent Labs  Lab 01/30/20 1343 02/01/20 1332 02/03/20 1324  WBC 10.3 11.1* 11.3*  HGB 9.2* 9.5* 9.6*  HCT 30.2* 30.9* 31.4*  MCV 90.7 89.8 90.2  PLT 302 295 285   Cardiac Enzymes: No results for input(s): CKTOTAL, CKMB, CKMBINDEX, TROPONINI in the last 168 hours. CBG: Recent Labs  Lab 02/05/20 0607 02/05/20 1149 02/05/20 1615 02/05/20 2056 02/06/20 0610  GLUCAP 123* 110* 127* 141* 84    Iron Studies: No results for input(s): IRON, TIBC, TRANSFERRIN, FERRITIN in the last 72  hours. Studies/Results: No results found.  Medications: Infusions: . sodium chloride 250 mL (02/05/20 1749)  . ertapenem 500 mg (02/05/20 1751)    Scheduled Medications: . (feeding supplement) PROSource Plus  30 mL Oral BID BM  . alosetron  0.5 mg Oral BID  . amitriptyline  10 mg Oral QHS  . amLODipine  10 mg Oral Daily  . atorvastatin  40 mg Oral q1800  . carvedilol  12.5 mg Oral BID WC  . Chlorhexidine Gluconate Cloth  6 each Topical Q0600  . darbepoetin (ARANESP) injection - DIALYSIS  100 mcg Intravenous Q Mon-HD  . DULoxetine  60 mg Oral Daily  . enoxaparin (LOVENOX) injection  30 mg Subcutaneous Q24H  .  hydrALAZINE  100 mg Oral TID  . insulin aspart  0-5 Units Subcutaneous QHS  . insulin aspart  0-9 Units Subcutaneous TID WC  . insulin glargine  15 Units Subcutaneous QHS  . melatonin  1.5 mg Oral QHS  . morphine  15 mg Oral Q12H  . multivitamin  1 tablet Oral QHS  . pantoprazole  40 mg Oral Daily  . senna-docusate  1 tablet Oral BID  . sevelamer carbonate  800 mg Oral TID WC    have reviewed scheduled and prn medications.  Physical Exam: General:NAD, comfortable Heart:RRR, s1s2 nl Lungs:clear b/l, no crackle Abdomen:soft, Non-tender, non-distended Extremities:No edema, left BKA Dialysis Access: Right IJ TDC, maturing left AV fistula.  Tabatha Razzano Prasad Cerys Winget 02/06/2020,9:57 AM  LOS: 14 days  Pager: [phone]

## 2020-02-06 NOTE — Progress Notes (Signed)
Occupational Therapy Session Note  Patient Details  Name: [name] MRN: [id number] Date of Birth: [birth year]  Today's Date: 02/06/2020 OT Individual Time: 8628-2417 OT Individual Time Calculation (min): 72 min    Short Term Goals: Week 1:  OT Short Term Goal 1 (Week 1): Pt will transfer with 1 assist in prep for toilet transfer and to decrease BOC OT Short Term Goal 1 - Progress (Week 1): Met OT Short Term Goal 2 (Week 1): Pt will perform LB dressing with Max A of 1 OT Short Term Goal 2 - Progress (Week 1): Met OT Short Term Goal 3 (Week 1): Pt will perform LB bathing with AE PRN with Mod A OT Short Term Goal 3 - Progress (Week 1): Met OT Short Term Goal 4 (Week 1): Pt will perform ADL activity for 10 mins without fatigue OT Short Term Goal 4 - Progress (Week 1): Met Week 2:  OT Short Term Goal 1 (Week 2): Pt will perform BSC/toilet transfer with Mod of 1 OT Short Term Goal 2 (Week 2): Pt will perform LB dress EOB level with Mod A and lateral leans with AE PRN OT Short Term Goal 3 (Week 2): Pt will perform sit to stand Mod A in prep for LB ADL   Skilled Therapeutic Interventions/Progress Updates:    Pt greeted at time of session sitting up on BSC having BM, OT to resume care. Pt given time at beginning of session to finish BM, total A for hygiene while performing partial push up from [facility] arm rests, unable to fully lift buttocks but able to lift enough for therapist to perform hygiene, pt able to do pericare in front. Attempted to don brief sitting on [facility] with assist to don over RLE for time management, but pt unable to fully lift buttocks to get brief fully up. Slide board transfer with towel on board for decreased friction to bed with Min A, pt able to don pants and brief over hips with lateral leans. Slide board transfer to w/c once dressed with CGA before setting up at sink and assisted with washing her hair at sink level, pt able to dry hair and perform grooming with set up at  wheelchair level. Slide board back to bed CGA/Min and sit to supine Supervision with bed features. Alarm on, call bell in reach. Pt did c/o hip pain, no number, notified NT to pass along to RN in request of pain meds.   Therapy Documentation Precautions:  Precautions Precautions: Fall Precaution Comments: new L BKA Required Braces or Orthoses: Other Brace Other Brace: L LE limb guard Restrictions Weight Bearing Restrictions: Yes LLE Weight Bearing: Non weight bearing    Therapy/Group: Individual Therapy  [name] 02/06/2020, 8:31 AM

## 2020-02-06 NOTE — Progress Notes (Signed)
Physical Therapy Session Note  Patient Details  Name: [name] MRN: [id number] Date of Birth: 12-Aug-1965  Today's Date: 02/06/2020 PT Individual Time: 1048-1130 PT Individual Time Calculation (min): 42 min   Short Term Goals: Week 2:  PT Short Term Goal 1 (Week 2): Patient will demonstrate standing balance with CGA x 2 minutes with 1-2 UE support in prep for standing ADL. PT Short Term Goal 2 (Week 2): Patient will perform slide board transfers with close S and min cues including w/c set up and board plaement. PT Short Term Goal 3 (Week 2): Patient will perform sit to stand consistently with mod A. PT Short Term Goal 4 (Week 2): Patient to initiate gait traning in parallel bars with mod A  Skilled Therapeutic Interventions/Progress Updates:    Patient in supine reports increased swelling L hip and noted edema above compression wrap and shrinker L BKA.  Patient performed supine therex consisting of R half bridge, R SLR, bilat hip adductor sets, R/L hip abduction, L SAQ, isometric hip extension all x 10 reps.  Patient supine to sit with S.  C/o pain throughout bed mobility and intermittent nausea.  RN in to deliver pain medication and nausea medication.  Patient returned to supine with S.  Ace wrap x 3 6" bandages to L hip for edema control and MD made aware.  Patient left supine with needs in reach and plan to try to see her just after she eats prior to dialysis transport.   Upon return to check on pt she was just beginning to eat after seen by PA. Patient missed 33 minutes of PT due to pain  Therapy Documentation Precautions:  Precautions Precautions: Fall Precaution Comments: new L BKA Required Braces or Orthoses: Other Brace Other Brace: L LE limb guard Restrictions Weight Bearing Restrictions: Yes LLE Weight Bearing: Non weight bearing General: PT Amount of Missed Time (min): 33 Minutes PT Missed Treatment Reason: Pain Pain: Pain Assessment Pain Scale: 0-10 Pain Score:  9 Pain Type: Acute pain Pain Location: Hip Pain Orientation: Left Pain Descriptors / Indicators: Aching;Tightness;Spasm Pain Frequency: Constant Pain Onset: With Activity Pain Intervention(s): Elevated extremity;Repositioned    Therapy/Group: Individual Therapy  [name]  [name], PT 02/06/2020, 1:58 PM

## 2020-02-06 NOTE — Progress Notes (Signed)
[facility] PHYSICAL MEDICINE & REHABILITATION PROGRESS NOTE   Subjective/Complaints:  Pt reports having BM as we speak on BSC-  Only taking stool softener, not laxative.   Slept "OK" but wakes up a lot.   Per staff, AFTER saw pt, has a lot of thigh edema/swelling- will get CT of abd/pelvis since concerned what's blocking/causing LE edema  ROS:  Pt denies SOB, abd pain, CP, N/V/C/D, and vision changes  Objective:   No results found. Recent Labs    02/03/20 1324  WBC 11.3*  HGB 9.6*  HCT 31.4*  PLT 285   Recent Labs    02/03/20 1324  NA 128*  K 3.9  CL 90*  CO2 26  GLUCOSE 136*  BUN 44*  CREATININE 6.61*  CALCIUM 8.6*    Intake/Output Summary (Last 24 hours) at 02/06/2020 1310 Last data filed at 02/06/2020 0737 Gross per 24 hour  Intake 840 ml  Output --  Net 840 ml     Pressure Injury 01/23/20 Buttocks Left Deep Tissue Pressure Injury - Purple or maroon localized area of discolored intact skin or blood-filled blister due to damage of underlying soft tissue from pressure and/or shear. Circular, dark, red area, surrounde (Active)  01/23/20 1609  Location: Buttocks  Location Orientation: Left  Staging: Deep Tissue Pressure Injury - Purple or maroon localized area of discolored intact skin or blood-filled blister due to damage of underlying soft tissue from pressure and/or shear.  Wound Description (Comments): Circular, dark, red area, surrounded by MASD  Present on Admission: Yes    Physical Exam: Vital Signs Blood pressure (!) 143/62, pulse 61, temperature 98.1 F (36.7 C), temperature source Oral, resp. rate 17, height 5\' 10"  (1.778 m), weight 89.7 kg, SpO2 95 %. Constitutional: sitting up in BSC, appropriate, NAD HEENT: EOMI, oral membranes moist Neck: supple Cardiovascular: no JVD   Respiratory/Chest: no resp distress, normal effort GI/Abdomen: nondistended Ext: no clubbing, cyanosis, or edema Psych: pleasant and cooperative Skin: Warm and dry.  L  BKA-  Well shaped with shrinker.  L hip looks great- sutures out-steristrips- has more L>R thigh edema/swelling- 2-3+  Musc: Left BKA with mild edema and tenderness Right lower extremity edema, stable Neuro: Alert Motor: Right lower extremity: Hip flexion, knee extension 4-5/5 (pain inhibition), ankle dorsiflexion 4+/5, stable exam Left lower extremity: Hip flexion, knee extension 3/5 (some pain inhibition), ankle dorsiflexion 4/5   Assessment/Plan: 1. Functional deficits which require 3+ hours per day of interdisciplinary therapy in a comprehensive inpatient rehab setting.  Physiatrist is providing close team supervision and 24 hour management of active medical problems listed below.  Physiatrist and rehab team continue to assess barriers to discharge/monitor patient progress toward functional and medical goals  Care Tool:  Bathing    Body parts bathed by patient: Right arm, Left arm, Chest, Abdomen, Front perineal area, Face, Right upper leg, Left upper leg   Body parts bathed by helper: Buttocks, Right lower leg (RLE for thoroughness) Body parts n/a: Left lower leg   Bathing assist Assist Level: Minimal Assistance - Patient > 75% (sink level UB, bed level LB)     Upper Body Dressing/Undressing Upper body dressing   What is the patient wearing?: Pull over shirt    Upper body assist Assist Level: Supervision/Verbal cueing    Lower Body Dressing/Undressing Lower body dressing      What is the patient wearing?: Pants, Underwear/pull up     Lower body assist Assist for lower body dressing: Moderate Assistance - Patient 50 -  74%     Toileting Toileting Toileting Activity did not occur (Probation officer and hygiene only): N/A (no void or bm)  Toileting assist Assist for toileting: Total Assistance - Patient < 25% (drop arm BSC) Assistive Device Comment: female urinal   Transfers Chair/bed transfer  Transfers assist  Chair/bed transfer activity did not occur:  Safety/medical concerns  Chair/bed transfer assist level: Minimal Assistance - Patient > 75% Chair/bed transfer assistive device: Sliding board   Locomotion Ambulation   Ambulation assist   Ambulation activity did not occur: Safety/medical concerns          Walk 10 feet activity   Assist  Walk 10 feet activity did not occur: Safety/medical concerns        Walk 50 feet activity   Assist Walk 50 feet with 2 turns activity did not occur: Safety/medical concerns         Walk 150 feet activity   Assist Walk 150 feet activity did not occur: Safety/medical concerns         Walk 10 feet on uneven surface  activity   Assist Walk 10 feet on uneven surfaces activity did not occur: Safety/medical concerns         Wheelchair     Assist Will patient use wheelchair at discharge?: Yes Type of Wheelchair: Manual    Wheelchair assist level: Supervision/Verbal cueing Max wheelchair distance: 130ft    Wheelchair 50 feet with 2 turns activity    Assist        Assist Level: Supervision/Verbal cueing   Wheelchair 150 feet activity     Assist      Assist Level: Supervision/Verbal cueing   Medical Problem List and Plan: 1.  Impaired mobility and ADLs secondary to L BKA  Continue CIR 2.  Antithrombotics: -DVT/anticoagulation:  Pharmaceutical: Lovenox             -antiplatelet therapy: N/A 3. Chronic pain/Pain Management: Was on oxycodone 5 mg tid prn as well Cymbalta for neuropathy (at baseline)- now on 10-15 mg q4 hours prn  12/2- will start MS Contin 15 mg BID  12/3-5[facility] improved pain for pt- con't MS Contin  12/6- pain controlled- con't regimen 4. Mood: Team to provide ego support/encouragement.              -antipsychotic agents: N/A 5. Neuropsych: This patient is capable of making decisions on her own behalf. 6. Skin/Wound Care: Monitor wound daily   Continue stump shrinker   11/30- will call Vascular on Thursday about getting  sutures out of L BKA- has been 4 weeks.   12/3- L/M- with vascular  12/5 vascular hasn't seen pt yet--primary team will need to reach out again tomorrow  12/6- will check with vascular if can get staples out-  7. Fluids/Electrolytes/Nutrition: Strict I/O. 1200 cc FR. Will consult dietician to educate on renal diet.  8. Left hip ORIF/hip abscess s/p I&D 11/18: NWB LLE.   Flagyl changed to Ertepenem due to patient refusal, continue  12/1- on Ertepenem until end of December.  9. 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 T1DM with neuropathy: Hgb A1c-6.2. Was on Lantus 50 units in am with 10 units novolog tid.     Lantus 38 twice daily, decreased to 10 on 11/27, increased to 15 on 11/28  NovoLog 5 units with breakfast, 8 units with lunch and dinner, decreased to 5 3 times daily on 11/27, DC'd on 11/27.  12/6- BGs stable- con't  regimen 11. Now ESRD: Now on HD MWF (set up at [facility] post discharge)  12. Anemia of chronic disease/iron deficiency: Hgb-6.3 at admission--->9.1 s/p 4 units PRBC. Feraheme X 1 (additional dose held due to infection) and Aranesp weekly.   Hemoglobin 9.7 on 11/26  Continue to monitor 13. HTN: Monitor BP tid--continue Coreg bid, Norvasc and apresoline tid.   Decreased Coreg to 12.5mg  BID.   12/5 BP controlled 14. IBS-D:   MiraLAX DC'd  Resumed Lotrenex    Colace as needed ordered  12/2- will make colace scheduled and add sorbitol if no BM by 3pm  12/4 changed colace to senna-s given addn of MS Contin  12/6- refusing laxatives- but had BM Saturday and this AM, so con't regimen 15. Malnutrition: Continue protein supplement.  16. Abdominal pain: Due to diarrhea? PPI resumed-->patient to have family bring in [place] from home.  17.  Sleep disturbance:   Low dose elavil at bedtime.  Melatonin added on 11/26  Improving overall 18. Leukocytosis:   WBCs 11.6 on 11/26, labs with HD  See #8, #9  Afebrile  12/3- WBC  11.1- afebrile; no Sx's- con't to monitor  12/6- WBC 11.3- cause?? 19. LE/thigh edema  12/6- will check CT of abd/pelvis with contrast to determine cause-got OK by nephrology to use contrast- has 2-3+ edema of thighs L>R  LOS: 14 days A FACE TO FACE EVALUATION WAS PERFORMED  Ladonne Sharples 02/06/2020, 1:10 PM

## 2020-02-06 NOTE — Progress Notes (Signed)
Occupational Therapy Session Note  Patient Details  Name: KALISSA GRAYS MRN: [id number] Date of Birth: 01/09/1966  Today's Date: 02/06/2020 OT Individual Time: 0910-1010 OT Individual Time Calculation (min): 60 min    Short Term Goals: Week 2:  OT Short Term Goal 1 (Week 2): Pt will perform BSC/toilet transfer with Mod of 1 OT Short Term Goal 2 (Week 2): Pt will perform LB dress EOB level with Mod A and lateral leans with AE PRN OT Short Term Goal 3 (Week 2): Pt will perform sit to stand Mod A in prep for LB ADL  Skilled Therapeutic Interventions/Progress Updates:    Patient in bed, alert - she notes pain and swelling in left LE at level "9", she states that she recently had pain meds.  Provided ice pack.  She requests bed level exercises due to getting OOB for adl in prior session.  Provided and reviewed "First Step"  Amputee coalition hand book.  Reviewed edema control and positioning.  She completed UB 4# dowel exercises.  Completed LB AROM/AAROM exercises.  She remained in bed at close of session, bed alarm set and call bell in hand.    Therapy Documentation Precautions:  Precautions Precautions: Fall Precaution Comments: new L BKA Required Braces or Orthoses: Other Brace Other Brace: L LE limb guard Restrictions Weight Bearing Restrictions: Yes LLE Weight Bearing: Non weight bearing   Therapy/Group: Individual Therapy  Carlos Levering 02/06/2020, 7:35 AM

## 2020-02-06 NOTE — Plan of Care (Signed)
  Problem: RH SKIN INTEGRITY Goal: RH STG SKIN FREE OF INFECTION/BREAKDOWN Description: With min assist Outcome: Progressing Goal: RH STG MAINTAIN SKIN INTEGRITY WITH ASSISTANCE Description: STG Maintain Skin Integrity With  min Assistance. Outcome: Progressing Goal: RH STG ABLE TO PERFORM INCISION/WOUND CARE W/ASSISTANCE Description: STG Able To Perform Incision/Wound Care With  min Assistance. Outcome: Progressing   Problem: RH SAFETY Goal: RH STG ADHERE TO SAFETY PRECAUTIONS W/ASSISTANCE/DEVICE Description: STG Adhere to Safety Precautions With cues/reminders Assistance/Device. Outcome: Progressing

## 2020-02-07 ENCOUNTER — Inpatient Hospital Stay (HOSPITAL_COMMUNITY): Payer: Medicaid Other

## 2020-02-07 ENCOUNTER — Inpatient Hospital Stay (HOSPITAL_COMMUNITY): Admit: 2020-02-07 | Payer: Medicaid Other

## 2020-02-07 ENCOUNTER — Inpatient Hospital Stay (HOSPITAL_COMMUNITY): Payer: Medicaid Other | Admitting: Occupational Therapy

## 2020-02-07 ENCOUNTER — Encounter: Payer: Medicaid Other | Admitting: Vascular Surgery

## 2020-02-07 DIAGNOSIS — S88112A Complete traumatic amputation at level between knee and ankle, left lower leg, initial encounter: Secondary | ICD-10-CM | POA: Diagnosis not present

## 2020-02-07 DIAGNOSIS — M8618 Other acute osteomyelitis, other site: Secondary | ICD-10-CM

## 2020-02-07 DIAGNOSIS — M25552 Pain in left hip: Secondary | ICD-10-CM

## 2020-02-07 DIAGNOSIS — I120 Hypertensive chronic kidney disease with stage 5 chronic kidney disease or end stage renal disease: Secondary | ICD-10-CM

## 2020-02-07 DIAGNOSIS — E1069 Type 1 diabetes mellitus with other specified complication: Secondary | ICD-10-CM

## 2020-02-07 LAB — GLUCOSE, CAPILLARY
Glucose-Capillary: 129 mg/dL — ABNORMAL HIGH (ref 70–99)
Glucose-Capillary: 69 mg/dL — ABNORMAL LOW (ref 70–99)
Glucose-Capillary: 88 mg/dL (ref 70–99)
Glucose-Capillary: 127 mg/dL — ABNORMAL HIGH (ref 70–99)
Glucose-Capillary: 117 mg/dL — ABNORMAL HIGH (ref 70–99)

## 2020-02-07 MED ORDER — IOHEXOL 300 MG/ML  SOLN
100.0000 mL | Freq: Once | INTRAMUSCULAR | Status: AC | PRN
  Administered 2020-02-07: 100 mL via INTRAVENOUS

## 2020-02-07 MED ORDER — CHLORHEXIDINE GLUCONATE CLOTH 2 % EX PADS
6.0000 | MEDICATED_PAD | Freq: Every day | CUTANEOUS | Status: DC
  Administered 2020-02-07 – 2020-02-12 (×6): 6 via TOPICAL

## 2020-02-07 NOTE — Progress Notes (Signed)
[name] PHYSICAL MEDICINE & REHABILITATION PROGRESS NOTE   Subjective/Complaints:  Pt reports L hip is still draining a little- Had LBM yesterday- feeling OK   Na is 127- on fluid restriction already- has been low, but has gotten worse- will d/w Nephrology.   Also, CT scan reports shows multiple abscess/rim enhancing lesions in L thigh/ Glute, etc.    ROS:   Pt denies SOB, abd pain, CP, N/V/C/D, and vision changes  Objective:   CT ABDOMEN PELVIS W CONTRAST  Result Date: 02/07/2020 CLINICAL DATA:  Abdominal distension. Right flank edema. History of left hip infection. EXAM: CT ABDOMEN AND PELVIS WITH CONTRAST TECHNIQUE: Multidetector CT imaging of the abdomen and pelvis was performed using the standard protocol following bolus administration of intravenous contrast. CONTRAST:  170mL OMNIPAQUE IOHEXOL 300 MG/ML  SOLN COMPARISON:  CT dated May 07, 2005. FINDINGS: Lower chest: The lung bases are clear. The heart is enlarged. Hepatobiliary: The liver is normal. Status post cholecystectomy.There is no biliary ductal dilation. Pancreas: Normal contours without ductal dilatation. No peripancreatic fluid collection. Spleen: Unremarkable. Adrenals/Urinary Tract: --Adrenal glands: Unremarkable. --Right kidney/ureter: No hydronephrosis or radiopaque kidney stones. --Left kidney/ureter: No hydronephrosis or radiopaque kidney stones. --Urinary bladder: Unremarkable. Stomach/Bowel: --Stomach/Duodenum: No hiatal hernia or other gastric abnormality. Normal duodenal course and caliber. --Small bowel: Unremarkable. --Colon: Rectosigmoid diverticulosis without acute inflammation. --Appendix: Normal. Vascular/Lymphatic: Atherosclerotic calcification is present within the non-aneurysmal abdominal aorta, without hemodynamically significant stenosis. --No retroperitoneal lymphadenopathy. --No mesenteric lymphadenopathy. --are enlarged left pelvic and left inguinal lymph nodes, likely reactive. Reproductive:  Unremarkable Other: No ascites or free air. There is extensive bilateral flank subcutaneous edema with overlying skin thickening, right worse than left. Musculoskeletal. Again noted is a intratrochanteric fracture of the proximal left femur. There is no significant interval healing. The patient is status post prior intramedullary nail placement.1 there is a rim enhancing collection with a pocket of gas coursing from the superior aspect of the greater trochanter towards the lateral proximal thigh measuring approximately 8 cm in length. At the lesser trochanter there is a non rim enhancing 4 cm collection (axial series 3, image 98). There is surrounding soft tissue swelling. There is hypoattenuation of the gluteal musculature. There is some lucency involving the greater trochanter and adjacent fracture fragments. There is a healed fracture of the left inferior pubic ramus. IMPRESSION: 1. Nonspecific bilateral flank edema is noted, right worse than left. 2. There is a rim enhancing collection extending from the greater trochanter of the left hip towards the lateral aspect of the proximal left thigh, concerning for an abscess. 3. There is a nonenhancing 4 cm collection adjacent to the lesser trochanter of the left hip which may represent a postoperative seroma or hematoma. 4. There are areas of lucency involving the proximal left femur which may be related to the patient's prior fracture, however in the setting of an adjacent abscess, osteomyelitis is not excluded. 5. Hypoattenuating areas throughout the left gluteal musculature is concerning for myositis. 6.  Aortic Atherosclerosis (ICD10-I70.0). Electronically Signed   By: Constance Holster M.D.   On: 02/07/2020 01:40   Recent Labs    02/06/20 1520  WBC 12.0*  HGB 10.0*  HCT 31.5*  PLT 287   Recent Labs    02/06/20 1520  NA 127*  K 4.4  CL 89*  CO2 28  GLUCOSE 103*  BUN 52*  CREATININE 7.60*  CALCIUM 8.5*    Intake/Output Summary (Last 24  hours) at 02/07/2020 0300 Last data filed at 02/07/2020  0105 Gross per 24 hour  Intake 950 ml  Output 3006 ml  Net -2056 ml     Pressure Injury 01/23/20 Buttocks Left Deep Tissue Pressure Injury - Purple or maroon localized area of discolored intact skin or blood-filled blister due to damage of underlying soft tissue from pressure and/or shear. Circular, dark, red area, surrounde (Active)  01/23/20 1609  Location: Buttocks  Location Orientation: Left  Staging: Deep Tissue Pressure Injury - Purple or maroon localized area of discolored intact skin or blood-filled blister due to damage of underlying soft tissue from pressure and/or shear.  Wound Description (Comments): Circular, dark, red area, surrounded by MASD  Present on Admission: Yes    Physical Exam: Vital Signs Blood pressure 125/68, pulse 60, temperature 98.5 F (36.9 C), temperature source Oral, resp. rate 18, height 5\' 10"  (1.778 m), weight 88 kg, SpO2 100 %. Constitutional: sitting up in bed- appropriate, appears comfortable, NAD HEENT: EOMI, oral membranes moist Neck: supple Cardiovascular: RRR- no JVD Respiratory/Chest: CTA B/L- no W/R/R- good air movement GI/Abdomen: Soft, NT, ND, (+)BS  Ext: no clubbing, cyanosis, or edema Psych: pleasant and cooperative Skin: Warm and dry.  L BKA-  Well shaped with shrinker.  L hip looks great- sutures out-steristrips-  L thigh is VERY swollen and L hip incision is draining slightly- through dressing- serosanguinous drainage.   Musc: Left BKA with mild edema and tenderness Right lower extremity edema, stable Neuro: Alert Motor: Right lower extremity: Hip flexion, knee extension 4-5/5 (pain inhibition), ankle dorsiflexion 4+/5, stable exam Left lower extremity: Hip flexion, knee extension 3/5 (some pain inhibition), ankle dorsiflexion 4/5   Assessment/Plan: 1. Functional deficits which require 3+ hours per day of interdisciplinary therapy in a comprehensive inpatient rehab  setting.  Physiatrist is providing close team supervision and 24 hour management of active medical problems listed below.  Physiatrist and rehab team continue to assess barriers to discharge/monitor patient progress toward functional and medical goals  Care Tool:  Bathing    Body parts bathed by patient: Right arm, Left arm, Chest, Abdomen, Front perineal area, Face, Right upper leg, Left upper leg   Body parts bathed by helper: Buttocks, Right lower leg (RLE for thoroughness) Body parts n/a: Left lower leg   Bathing assist Assist Level: Minimal Assistance - Patient > 75% (sink level UB, bed level LB)     Upper Body Dressing/Undressing Upper body dressing   What is the patient wearing?: Pull over shirt    Upper body assist Assist Level: Supervision/Verbal cueing    Lower Body Dressing/Undressing Lower body dressing      What is the patient wearing?: Pants, Underwear/pull up     Lower body assist Assist for lower body dressing: Moderate Assistance - Patient 50 - 74%     Toileting Toileting Toileting Activity did not occur (Clothing management and hygiene only): N/A (no void or bm)  Toileting assist Assist for toileting: Total Assistance - Patient < 25% (drop arm BSC) Assistive Device Comment: female urinal   Transfers Chair/bed transfer  Transfers assist  Chair/bed transfer activity did not occur: Safety/medical concerns  Chair/bed transfer assist level: Minimal Assistance - Patient > 75% Chair/bed transfer assistive device: Sliding board   Locomotion Ambulation   Ambulation assist   Ambulation activity did not occur: Safety/medical concerns          Walk 10 feet activity   Assist  Walk 10 feet activity did not occur: Safety/medical concerns        Walk 50 feet  activity   Assist Walk 50 feet with 2 turns activity did not occur: Safety/medical concerns         Walk 150 feet activity   Assist Walk 150 feet activity did not occur:  Safety/medical concerns         Walk 10 feet on uneven surface  activity   Assist Walk 10 feet on uneven surfaces activity did not occur: Safety/medical concerns         Wheelchair     Assist Will patient use wheelchair at discharge?: Yes Type of Wheelchair: Manual    Wheelchair assist level: Supervision/Verbal cueing Max wheelchair distance: 118ft    Wheelchair 50 feet with 2 turns activity    Assist        Assist Level: Supervision/Verbal cueing   Wheelchair 150 feet activity     Assist      Assist Level: Supervision/Verbal cueing   Medical Problem List and Plan: 1.  Impaired mobility and ADLs secondary to L BKA  Continue CIR 2.  Antithrombotics: -DVT/anticoagulation:  Pharmaceutical: Lovenox             -antiplatelet therapy: N/A 3. Chronic pain/Pain Management: Was on oxycodone 5 mg tid prn as well Cymbalta for neuropathy (at baseline)- now on 10-15 mg q4 hours prn  12/2- will start MS Contin 15 mg BID  12/3-5[facility] improved pain for pt- con't MS Contin  12/7- pain controlled- con't regimen 4. Mood: Team to provide ego support/encouragement.              -antipsychotic agents: N/A 5. Neuropsych: This patient is capable of making decisions on her own behalf. 6. Skin/Wound Care: Monitor wound daily   Continue stump shrinker   11/30- will call Vascular on Thursday about getting sutures out of L BKA- has been 4 weeks.   12/3- L/M- with vascular  12/5 vascular hasn't seen pt yet--primary team will need to reach out again tomorrow  12/6- will check with vascular if can get staples out-  7. Fluids/Electrolytes/Nutrition: Strict I/O. 1200 cc FR. Will consult dietician to educate on renal diet.  8. Left hip ORIF/hip abscess s/p I&D 11/18: NWB LLE.   Flagyl changed to Ertepenem due to patient refusal, continue  12/1- on Ertepenem until end of December.  12/7- pt has new rim enhancing lesions in L hip, thigh/glute, etc- have called Ortho and also  calling ID to discuss- might have been since switched off Flagyl?  9. 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 T1DM with neuropathy: Hgb A1c-6.2. Was on Lantus 50 units in am with 10 units novolog tid.     Lantus 38 twice daily, decreased to 10 on 11/27, increased to 15 on 11/28  NovoLog 5 units with breakfast, 8 units with lunch and dinner, decreased to 5 3 times daily on 11/27, DC'd on 11/27.  12/7- BGs 91-129 in last 24 hours- con't regimen 11. Now ESRD: Now on HD MWF (set up at [facility] post discharge)  12. Anemia of chronic disease/iron deficiency: Hgb-6.3 at admission--->9.1 s/p 4 units PRBC. Feraheme X 1 (additional dose held due to infection) and Aranesp weekly.   Hemoglobin 9.7 on 11/26  Continue to monitor 13. HTN: Monitor BP tid--continue Coreg bid, Norvasc and apresoline tid.   Decreased Coreg to 12.5mg  BID.   12/5 BP controlled 14. IBS-D:   MiraLAX DC'd  Resumed Lotrenex    Colace as needed ordered  12/2- will make colace  scheduled and add sorbitol if no BM by 3pm  12/4 changed colace to senna-s given addn of MS Contin  12/6- refusing laxatives- but had BM Saturday and this AM, so con't regimen 15. Malnutrition: Continue protein supplement.  16. Abdominal pain: Due to diarrhea? PPI resumed-->patient to have family bring in [place] from home.  17.  Sleep disturbance:   Low dose elavil at bedtime.  Melatonin added on 11/26  Improving overall 18. Leukocytosis:   WBCs 11.6 on 11/26, labs with HD  See #8, #9  Afebrile  12/3- WBC 11.1- afebrile; no Sx's- con't to monitor  12/6- WBC 11.3- cause??  12/7- WBC 12.0- due to abscesses in L thigh, glute, etc causing edema- have contacted Ortho AND ID- waiting to hear back.  19. LE/thigh edema  12/6- will check CT of abd/pelvis with contrast to determine cause-got OK by nephrology to use contrast- has 2-3+ edema of thighs L>R  12/7- due to rim enhancing lesions- as above.   We  spent a total of 35 minutes on total care today due to calling Ortho and ID about CT scan results as above.     LOS: 15 days A FACE TO FACE EVALUATION WAS PERFORMED  Britni Driscoll 02/07/2020, 8:39 AM

## 2020-02-07 NOTE — Plan of Care (Signed)
  Problem: RH SKIN INTEGRITY Goal: RH STG SKIN FREE OF INFECTION/BREAKDOWN Description: With min assist Outcome: Progressing Goal: RH STG MAINTAIN SKIN INTEGRITY WITH ASSISTANCE Description: STG Maintain Skin Integrity With  min Assistance. Outcome: Progressing Goal: RH STG ABLE TO PERFORM INCISION/WOUND CARE W/ASSISTANCE Description: STG Able To Perform Incision/Wound Care With  min Assistance. Outcome: Progressing   Problem: RH SAFETY Goal: RH STG ADHERE TO SAFETY PRECAUTIONS W/ASSISTANCE/DEVICE Description: STG Adhere to Safety Precautions With cues/reminders Assistance/Device. Outcome: Progressing

## 2020-02-07 NOTE — Progress Notes (Signed)
[facility] NEPHROLOGY PROGRESS NOTE  Assessment/ Plan: Pt is a 54 y.o. yo female with new ESRD, left foot infection is status post a BKA, left hip fracture status post ORIF, now in CIR.  # AoCKD 5,now ESRD: Baseline Creat 4.9- 5.2, eGFR 9-11 from 8/21 - 10/21. Creat 6 on admission, then mid 7's, IVF's given w/o improvementso started HD.Has been clipped to [facility].S/p [facility] and AVF on11/8, 1st HD 11/9. Continue MWF schedule. HD yesterday with around 3 L UF, plan for next dialysis tomorrow.  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 S/p 4U PRBCs.Tsat low 10%, got 575m Feraheme on 11/6, holding further doses due to infection. IncreasedAranesp 60 to 100 q week on 11/17.Last hemoglobin at goal.  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 Then changed to ertapenem (once daily) because pt wouldn't take the po flagyl.  This could cause problems w/ outpt antibiotic dosing at time of discharge, because cefepime will work w/ OP HD but ertapenem appears to require daily dosing. Will need to clarify with ID before discharge.  Discussed with primary team.  #HTN/ vol - BP stable,on hydralazine, coreg and norvasc.  UF during HD.  #BMM -CorrCa high initially - now improved. No VDRA.Started binder this admission, monitor phosphorus level. PTH 44.   #Hyponatremia, hypervolemic: Managed with dialysis,.  #Nutrition: albumin very low, on protein supplement.Renal diet w/fluid restrictions.  Subjective: Seen and examined.  Doing inpatient rehab.  Denies nausea vomiting chest pain shortness of breath.  No new event. Objective Vital signs in last 24 hours: Vitals:   02/06/20 1810 02/06/20 1832 02/06/20 2010 02/07/20 0500  BP: (!) 135/55 (!) 127/58 (!) 144/52 125/68  Pulse:   63 64 60  Resp: '11 14 17 18  ' Temp: 99.3 F (37.4 C) 99.1 F (37.3 C) 98.4 F (36.9 C) 98.5 F (36.9 C)  TempSrc: Oral   Oral  SpO2: 95% 98% 94% 100%  Weight: 87.4 kg   88 kg  Height:       Weight change: -2.3 kg  Intake/Output Summary (Last 24 hours) at 02/07/2020 1034 Last data filed at 02/07/2020 0800 Gross per 24 hour  Intake 1070 ml  Output 3006 ml  Net -1936 ml       Labs: Basic Metabolic Panel: Recent Labs  Lab 02/01/20 1331 02/03/20 1324 02/06/20 1520  NA 130* 128* 127*  K 4.1 3.9 4.4  CL 94* 90* 89*  CO2 '26 26 28  ' GLUCOSE 131* 136* 103*  BUN 46* 44* 52*  CREATININE 6.40* 6.61* 7.60*  CALCIUM 8.5* 8.6* 8.5*  PHOS 4.9* 5.2* 6.0*   Liver Function Tests: Recent Labs  Lab 02/01/20 1331 02/03/20 1324 02/06/20 1520  ALBUMIN 2.0* 2.0* 2.2*   No results for input(s): LIPASE, AMYLASE in the last 168 hours. No results for input(s): AMMONIA in the last 168 hours. CBC: Recent Labs  Lab 02/01/20 1332 02/03/20 1324 02/06/20 1520  WBC 11.1* 11.3* 12.0*  HGB 9.5* 9.6* 10.0*  HCT 30.9* 31.4* 31.5*  MCV 89.8 90.2 88.7  PLT 295 285 287   Cardiac Enzymes: No results for input(s): CKTOTAL, CKMB, CKMBINDEX, TROPONINI in the last 168 hours. CBG: Recent Labs  Lab 02/06/20 0610 02/06/20 1118 02/06/20 1828 02/06/20 2057 02/07/20 0552  GLUCAP 84 124* 91 111* 129*  Iron Studies: No results for input(s): IRON, TIBC, TRANSFERRIN, FERRITIN in the last 72 hours. Studies/Results: CT ABDOMEN PELVIS W CONTRAST  Result Date: 02/07/2020 CLINICAL DATA:  Abdominal distension. Right flank edema. History of left hip infection. EXAM: CT ABDOMEN AND PELVIS WITH CONTRAST TECHNIQUE: Multidetector CT imaging of the abdomen and pelvis was performed using the standard protocol following bolus administration of intravenous contrast. CONTRAST:  187m OMNIPAQUE IOHEXOL 300 MG/ML  SOLN COMPARISON:  CT dated May 07, 2005. FINDINGS: Lower chest: The lung bases are clear. The heart  is enlarged. Hepatobiliary: The liver is normal. Status post cholecystectomy.There is no biliary ductal dilation. Pancreas: Normal contours without ductal dilatation. No peripancreatic fluid collection. Spleen: Unremarkable. Adrenals/Urinary Tract: --Adrenal glands: Unremarkable. --Right kidney/ureter: No hydronephrosis or radiopaque kidney stones. --Left kidney/ureter: No hydronephrosis or radiopaque kidney stones. --Urinary bladder: Unremarkable. Stomach/Bowel: --Stomach/Duodenum: No hiatal hernia or other gastric abnormality. Normal duodenal course and caliber. --Small bowel: Unremarkable. --Colon: Rectosigmoid diverticulosis without acute inflammation. --Appendix: Normal. Vascular/Lymphatic: Atherosclerotic calcification is present within the non-aneurysmal abdominal aorta, without hemodynamically significant stenosis. --No retroperitoneal lymphadenopathy. --No mesenteric lymphadenopathy. --are enlarged left pelvic and left inguinal lymph nodes, likely reactive. Reproductive: Unremarkable Other: No ascites or free air. There is extensive bilateral flank subcutaneous edema with overlying skin thickening, right worse than left. Musculoskeletal. Again noted is a intratrochanteric fracture of the proximal left femur. There is no significant interval healing. The patient is status post prior intramedullary nail placement.1 there is a rim enhancing collection with a pocket of gas coursing from the superior aspect of the greater trochanter towards the lateral proximal thigh measuring approximately 8 cm in length. At the lesser trochanter there is a non rim enhancing 4 cm collection (axial series 3, image 98). There is surrounding soft tissue swelling. There is hypoattenuation of the gluteal musculature. There is some lucency involving the greater trochanter and adjacent fracture fragments. There is a healed fracture of the left inferior pubic ramus. IMPRESSION: 1. Nonspecific bilateral flank edema is noted, right worse  than left. 2. There is a rim enhancing collection extending from the greater trochanter of the left hip towards the lateral aspect of the proximal left thigh, concerning for an abscess. 3. There is a nonenhancing 4 cm collection adjacent to the lesser trochanter of the left hip which may represent a postoperative seroma or hematoma. 4. There are areas of lucency involving the proximal left femur which may be related to the patient's prior fracture, however in the setting of an adjacent abscess, osteomyelitis is not excluded. 5. Hypoattenuating areas throughout the left gluteal musculature is concerning for myositis. 6.  Aortic Atherosclerosis (ICD10-I70.0). Electronically Signed   By: CConstance HolsterM.D.   On: 02/07/2020 01:40    Medications: Infusions: . sodium chloride 250 mL (02/05/20 1749)  . ertapenem 500 mg (02/06/20 1836)    Scheduled Medications: . (feeding supplement) PROSource Plus  30 mL Oral BID BM  . alosetron  0.5 mg Oral BID  . amitriptyline  10 mg Oral QHS  . amLODipine  10 mg Oral Daily  . atorvastatin  40 mg Oral q1800  . carvedilol  12.5 mg Oral BID WC  . Chlorhexidine Gluconate Cloth  6 each Topical Q0600  . darbepoetin (ARANESP) injection - DIALYSIS  100 mcg Intravenous Q Mon-HD  . DULoxetine  60 mg Oral Daily  . enoxaparin (LOVENOX) injection  30 mg Subcutaneous Q24H  . hydrALAZINE  100 mg Oral TID  . insulin aspart  0-5 Units Subcutaneous QHS  .  insulin aspart  0-9 Units Subcutaneous TID WC  . insulin glargine  15 Units Subcutaneous QHS  . melatonin  1.5 mg Oral QHS  . morphine  15 mg Oral Q12H  . multivitamin  1 tablet Oral QHS  . pantoprazole  40 mg Oral Daily  . senna-docusate  1 tablet Oral BID  . sevelamer carbonate  800 mg Oral TID WC    have reviewed scheduled and prn medications.  Physical Exam: General:NAD, comfortable Heart:RRR, s1s2 nl Lungs: Clear b/l, no crackle Abdomen:soft, Non-tender, non-distended Extremities:No edema, left  BKA Dialysis Access: Right IJ TDC, maturing left AV fistula.  [name] 02/07/2020,10:34 AM  LOS: 15 days  Pager: [phone]

## 2020-02-07 NOTE — Progress Notes (Signed)
Physical Therapy Session Note  Patient Details  Name: [name] MRN: [id number] Date of Birth: 1965/03/27  Today's Date: 02/07/2020 PT Individual Time: 1030-1128 PT Individual Time Calculation (min): 58 min   Short Term Goals: Week 2:  PT Short Term Goal 1 (Week 2): Patient will demonstrate standing balance with CGA x 2 minutes with 1-2 UE support in prep for standing ADL. PT Short Term Goal 2 (Week 2): Patient will perform slide board transfers with close S and min cues including w/c set up and board plaement. PT Short Term Goal 3 (Week 2): Patient will perform sit to stand consistently with mod A. PT Short Term Goal 4 (Week 2): Patient to initiate gait traning in parallel bars with mod A  Skilled Therapeutic Interventions/Progress Updates:    Patient received sitting up in bed, agreeable to PT. She reports "mild" pain in L hip, premedicated. PT providing patient with repositioning, distractions and rest breaks to assist with pain management. Patient able to come to sit edge of bed with supervision and transfer to wc via slideboard with [place]. Patient propelling herself in wc to therapy gym ~167ft with supervision. Patient reporting that her bed at home is very high, >32". Attempted uphill slideboard transfer, but patient unable to successfully and safely complete transfer, despite MaxA provided. Discussed lowering bed by removing boxspring, etc. Patient reported that she would discuss with her daughter. Patient transferring to therapy mat up slight elevation with ModA via slideboard. Lateral scooting on therapy mat with emphasis on head/hip relations x3 passes B across mat. Tricep pushups 3x5 with minimal bottom clearance noted indicating decreased B UE functional strength. Patient returning to wc via slideboard with MinA and transferring back to bed with MinA slideboard. Patient remaining up in bed, bed alarm on, call light within reach.    Therapy Documentation Precautions:   Precautions Precautions: Fall Precaution Comments: new L BKA Required Braces or Orthoses: Other Brace Other Brace: L LE limb guard Restrictions Weight Bearing Restrictions: Yes LLE Weight Bearing: Non weight bearing    Therapy/Group: Individual Therapy  Karoline Caldwell, PT, DPT, CBIS  02/07/2020, 7:52 AM

## 2020-02-07 NOTE — Progress Notes (Signed)
Occupational Therapy Session Note  Patient Details  Name: [name] MRN: [id number] Date of Birth: Dec 22, 1965  Today's Date: 02/07/2020 OT Individual Time: 7253-6644 OT Individual Time Calculation (min): 55 min    Short Term Goals: Week 2:  OT Short Term Goal 1 (Week 2): Pt will perform BSC/toilet transfer with Mod of 1 OT Short Term Goal 2 (Week 2): Pt will perform LB dress EOB level with Mod A and lateral leans with AE PRN OT Short Term Goal 3 (Week 2): Pt will perform sit to stand Mod A in prep for LB ADL   Skilled Therapeutic Interventions/Progress Updates:    Pt greeted at time of session semireclined in bed, no pain initially and agreeable to OT session. Pt did have some discomfort in L hip later in session with rest breaks PRN and pt premedicated per pt. Wrapped residual limb past knee level, edema improved from yesterday but still present. Supine to sit Supervision, donned new sock EOB Mod A as well as washing R foot Mod A as well to reach dorsal aspect. Lateral scoot transfer (no slide board) Mod A bed > w/c before set up at sink for UB bathe/dress with Supervision/set up. Oral hygiene Mod I from w/c level, grooming with set up. Pt able to perform LB bathing seated in w/c for thighs and past knee level on RLE with CGA seated but declined washing buttocks and periarea today stating she just put on a new brief this am and washed then. Lateral scoot transfer back to bed with Mod A with use of bed rail this time, declined changing shorts in favor of staying in current pair. Sit to supine supervisoin, scooting up in bed toward [facility] in same manner. Continued discussion of DC planning and need for assist at home. Alarm on, call bell in reach.    Therapy Documentation Precautions:  Precautions Precautions: Fall Precaution Comments: new L BKA Required Braces or Orthoses: Other Brace Other Brace: L LE limb guard Restrictions Weight Bearing Restrictions: Yes LLE Weight Bearing: Non  weight bearing     Therapy/Group: Individual Therapy  [name] 02/07/2020, 9:05 AM

## 2020-02-07 NOTE — Progress Notes (Signed)
Patient ID: [name], female   DOB: 09/23/1965, 54 y.o.   MRN: [id number] Met with pt to discuss team conference progress toward her goals and the discharge date still 12/21. She can see progress but it is not fast enough and she is somewhat discouraged by the abscesses findings, but is aware of ID and ortho being consulted for plan. She will be moving into a rental home and is not sure if will be before discharge home or after. Aware recommendation is for a ramp due to she will not able to do stairs at discharge. She will check with landlord regarding putting one up. Will ask neuro-psych to see again due to new findings. Will order wheelchair and drop arm bedside commode pt can get a rolling walker from mother in-law. Trying to find home health but limited due to Medicaid coverage.

## 2020-02-07 NOTE — Patient Care Conference (Signed)
Inpatient RehabilitationTeam Conference and Plan of Care Update Date: 02/07/2020   Time: 11:45 AM    Patient Name: [name]      Medical Record Number: [id number]  Date of Birth: [birth year] Sex: Female         Room/Bed: 4M01C/4M01C-01 Payor Info: Payor:  [employer] / Plan: [place] / Product Type: *No Product type* /    Admit Date/Time:  01/23/2020  3:45 PM  Primary Diagnosis:  Below-knee amputation of left lower extremity [facility])  Hospital Problems: Principal Problem:   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[place]) Active Problems:   ESRD on dialysis ([place])   Sleep disturbance   Postoperative pain   Controlled type 1 diabetes mellitus with hyperglycemia ([place])   Leukocytosis   Essential hypertension   Bacteremia   Hypoglycemia    Expected Discharge Date: Expected Discharge Date: 02/21/20  Team Members Present: Physician leading conference: Dr. Courtney Heys Care Coodinator Present: Dorthula Nettles, RN, BSN, CRRN;Becky Dupree, LCSW Nurse Present: Other (comment) Dina Rich, RN) PT Present: Stacy Gardner, PT OT Present: Lillia Corporal, OT PPS Coordinator present : Ileana Ladd, Burna Mortimer, SLP     Current Status/Progress Goal Weekly Team Focus  Bowel/Bladder   Pt is continent x2.   Pt will remain continent x2.   Assess q shift and prn.    Swallow/Nutrition/ Hydration             ADL's   slide board CGA/Min pending pain, LB dress EOB Min-Mod, LB bathing bed level/EOB with LHS and lateral leans, BSC slide board Min A, total A for hygiene and clothing management  Min-Mod for LB, Min for transfers  squat pivot transfers, continue with EOB LB ADLs, lateral leans, UB strength, sit to stands.   Mobility   S for slide board transfers with min A for board placement, S for w/c mobility up to 200', sit to stand mod A to RW, static standing balance w/ UE support min A x about 1 minute  mod I slide board transfer, min A sit to  stand, min A short distance ambulation, mod I w/c mobility  transfers, LE strength, standing balance, w/c set up, wrapping for edema management   Communication             Safety/Cognition/ Behavioral Observations            Pain   Pt reports pain is controlled with medication.   Pain will remain <3 with medication management.   Assess q shift and prn.    Skin   Pt has L-BKA, L Hip Dressing, LFA fistula.   Pt will continue to heal; dressing changes to be completed q shift and prn.   Assess q shift and prn.      Discharge Planning:  HOme with daughter who is there early am and then in the evening, will be alone at times. Making progress in therapies   Team Discussion: Continent B/B, giving pain medication every 4 hr. Patient appears depressed. Not much drainage from left hip. OT reports patient is a mod assist squat pivot for transfers. Mod assist for lower body bathing, can do lateral leans to assist with hygiene. Transfers back to bed for clothing management. PT reports patient is supervision to contact guard with slideboard transfers. Patient reports her bed is a high bed at home and cannot make adjustments to it. It has a boxspring, and 2 mattresses according to the patient. Will need to verify with family.  Patient on target to meet rehab goals: yes  *See Care Plan and progress notes for long and short-term goals.   Revisions to Treatment Plan:  MD reports abscesses to LLE and may require surgery, will continue to monitor. Plan is still to go home on IV antibiotics.  Teaching Needs: Continue with family education, wound care and dressing change education.  Current Barriers to Discharge: Decreased caregiver support, Home enviroment access/layout, IV antibiotics, Wound care, Lack of/limited family support, Hemodialysis, Weight bearing restrictions, Pending surgery and Behavior  Possible Resolutions to Barriers: Continue current medications, educate wound care and dressing changes,  educate weight bearing precautions, continue pain management, provide emotional support to patient and family.     Medical Summary Current Status: new rim enhancing lesions-abscesses on LLE- L hip draining SLIGHTLY- ESRD- on HD- Na 127; continent BSC  Barriers to Discharge: Behavior;Decreased family/caregiver support;Hemodialysis;Home enviroment access/layout;Medical stability;IV antibiotics;Weight bearing restrictions;Wound care;Other (comments)  Barriers to Discharge Comments: more depressed- alone some- home with daughter; type I DM; possible pending surgery due to abscesses; bed quite high at home; Possible Resolutions to [employer] Focus: mod A LB; using BSC some; mod A toileting; goals min A LB; using transfer board; fluctuates SBA-mod A; bed at home?;  d/c 12/21   Continued Need for Acute Rehabilitation Level of Care: The patient requires daily medical management by a physician with specialized training in physical medicine and rehabilitation for the following reasons: Direction of a multidisciplinary physical rehabilitation program to maximize functional independence : Yes Medical management of patient stability for increased activity during participation in an intensive rehabilitation regime.: Yes Analysis of laboratory values and/or radiology reports with any subsequent need for medication adjustment and/or medical intervention. : Yes   I attest that I was present, lead the team conference, and concur with the assessment and plan of the team.   Cristi Loron 02/07/2020, 4:06 PM

## 2020-02-07 NOTE — Consult Note (Signed)
ORTHOPAEDIC CONSULTATION  REQUESTING PHYSICIAN: Courtney Heys, MD  PCP:  Julian Hy, PA-C  Chief Complaint: Left hip edema and drainage  HPI: [name] is a 54 y.o. female who complains of left hip drainage S/P left intramedullary nail. Consult was requested due to edema and drainage.  Initial hip surgery was on 10/28 with I and D on 11/18.  Patient has been in [place].  Past Medical History:  Diagnosis Date  . Anxiety 2002  . Chest tightness   . Depression 2001  . Diabetes type 1, uncontrolled ([place])    at age 89  . Diabetic neuropathy ([place])   . Essential hypertension 2015  . GERD (gastroesophageal reflux disease)    about age of 68  . Nausea and vomiting in adult   . Stroke ([place])   . Urinary frequency   . Yeast vaginitis    Past Surgical History:  Procedure Laterality Date  . ACHILLES TENDON SURGERY Left 03/10/2013   Procedure: LEFT CHOPART AMPUTATION/ LEFT TENDON ACHILLES [place];  Surgeon: Wylene Simmer, MD;  Location: [place];  Service: Orthopedics;  Laterality: Left;  . AMPUTATION Left 06/15/2012   Procedure: AMPUTATION DIGIT;  Surgeon: Meredith Pel, MD;  Location: [facility];  Service: Orthopedics;  Laterality: Left;  Left great toe revision amputation  . AMPUTATION Left 01/12/2020   Procedure: AMPUTATION BELOW KNEE;  Surgeon: Wylene Simmer, MD;  Location: [place];  Service: Orthopedics;  Laterality: Left;  . AV FISTULA PLACEMENT Left 01/09/2020   Procedure: LEFT UPPER ARM ARTERIOVENOUS (AV) FISTULA CREATION;  Surgeon: Cherre Robins, MD;  Location: [facility];  Service: Vascular;  Laterality: Left;  . ENDOMETRIAL ABLATION    . I & D EXTREMITY Left 01/19/2020   Procedure: IRRIGATION AND DEBRIDEMENT Left Hip;  Surgeon: Rod Can, MD;  Location: [facility];  Service: Orthopedics;  Laterality: Left;  . INSERTION OF DIALYSIS CATHETER Right 01/09/2020   Procedure: INSERTION OF DIALYSIS CATHETER;  Surgeon: Cherre Robins, MD;  Location: [facility];   Service: Vascular;  Laterality: Right;  . INTRAMEDULLARY (IM) NAIL INTERTROCHANTERIC Left 12/29/2019   Procedure: INTRAMEDULLARY (IM) NAIL INTERTROCHANTRIC;  Surgeon: Rod Can, MD;  Location: [facility];  Service: Orthopedics;  Laterality: Left;  . IR ANGIOGRAM EXTREMITY LEFT  12/17/2018  . IR FEM POP ART ATHERECT INC PTA MOD SED  12/17/2018  . IR RADIOLOGIST EVAL & MGMT  11/30/2018  . IR RADIOLOGIST EVAL & MGMT  12/09/2018  . IR RADIOLOGIST EVAL & MGMT  02/08/2019  . IR US GUIDE VASC ACCESS RIGHT  12/17/2018  . LAPAROSCOPIC CHOLECYSTECTOMY  01/30/2015   [facility]    Social History   Socioeconomic History  . Marital status: Divorced    Spouse name: Not on file  . Number of children: 1  . Years of education: Not on file  . Highest education level: Not on file  Occupational History    Employer: [employer]  Tobacco Use  . Smoking status: Light Tobacco Smoker    Packs/day: 0.25    Years: 22.00    Pack years: 5.50    Types: Cigarettes  . Smokeless tobacco: Never Used  Vaping Use  . Vaping Use: Never used  Substance and Sexual Activity  . Alcohol use: Yes    Alcohol/week: 0.0 standard drinks    Comment: sociial  . Drug use: No  . Sexual activity: Yes    Partners: Male  Other Topics Concern  . Not on file  Social History  Narrative   Lives at home with her daughter   Right handed   Caffeine: 3 cups daily   Social Determinants of Health   Financial Resource Strain:   . Difficulty of Paying Living Expenses: Not on file  Food Insecurity:   . Worried About Charity fundraiser in the Last Year: Not on file  . Ran Out of Food in the Last Year: Not on file  Transportation Needs:   . Lack of Transportation (Medical): Not on file  . Lack of Transportation (Non-Medical): Not on file  Physical Activity:   . Days of Exercise per Week: Not on file  . Minutes of Exercise per Session: Not on file  Stress:   . Feeling of Stress : Not on file  Social Connections:   . Frequency of  Communication with Friends and Family: Not on file  . Frequency of Social Gatherings with Friends and Family: Not on file  . Attends Religious Services: Not on file  . Active Member of Clubs or Organizations: Not on file  . Attends Archivist Meetings: Not on file  . Marital Status: Not on file   Family History  Problem Relation Age of Onset  . Diabetes Mother   . Heart attack Mother   . Heart disease Mother        before age 1  . Hypertension Mother   . Hyperlipidemia Mother   . Diabetes Father   . Heart attack Father   . Heart disease Father        before age 79  . Diabetes Sister   . Hypertension Sister    Allergies  Allergen Reactions  . Trazodone Swelling  . Latex Rash  . Lidocaine Itching   Prior to Admission medications   Medication Sig Start Date End Date Taking? Authorizing Provider  alosetron (LOTRONEX) 1 MG tablet Take 1 mg by mouth 2 (two) times daily. 12/19/19   [provider]  amLODipine (NORVASC) 10 MG tablet Take 1 tablet (10 mg total) by mouth daily. 01/23/20   Debbe Odea, MD  aspirin (ASPIRIN CHILDRENS) 81 MG chewable tablet Chew 1 tablet (81 mg total) by mouth 2 (two) times daily with a meal. 01/02/20 02/16/20  Cherlynn June B, PA  atorvastatin (LIPITOR) 40 MG tablet TAKE 1 TABLET (40 MG TOTAL) BY MOUTH DAILY AT 6 PM. 02/22/19   Fulp, Cammie, MD  carvedilol (COREG) 25 MG tablet Take 1 tablet (25 mg total) by mouth 2 (two) times daily with a meal. 01/23/20   Debbe Odea, MD  ceFEPIme 1 g in sodium chloride 0.9 % 100 mL Inject 1 g into the vein daily. 01/23/20   Debbe Odea, MD  cyclobenzaprine (FLEXERIL) 10 MG tablet Take 1 tablet (10 mg total) by mouth 2 (two) times daily as needed for muscle spasms. 01/23/20   Debbe Odea, MD  DEXILANT 60 MG capsule Take 60 mg by mouth daily. 06/09/17   [provider]  DULoxetine (CYMBALTA) 60 MG capsule TAKE 1 CAPSULE BY MOUTH EVERY DAY. Please make PCP appointment. Patient taking  differently: Take 60 mg by mouth daily.  03/23/19   Fulp, Cammie, MD  enoxaparin (LOVENOX) 30 MG/0.3ML injection Inject 0.3 mLs (30 mg total) into the skin daily. 01/24/20   Debbe Odea, MD  hydrALAZINE (APRESOLINE) 100 MG tablet Take 1 tablet (100 mg total) by mouth 3 (three) times daily. 01/23/20   Debbe Odea, MD  insulin glargine (LANTUS) 100 UNIT/ML injection Inject 0.38 mLs (38 Units total)  into the skin at bedtime. 01/23/20   Debbe Odea, MD  Menthol-Methyl Salicylate (MUSCLE RUB) 10-15 % CREA Apply 1 application topically as needed for muscle pain. 02/02/20   Love, Ivan Anchors, PA-C  Nutritional Supplements (,FEEDING SUPPLEMENT, PROSOURCE PLUS) liquid Take 30 mLs by mouth 2 (two) times daily between meals. 01/23/20   Debbe Odea, MD  oxyCODONE (OXY IR/ROXICODONE) 5 MG immediate release tablet Take 1-2 tablets (5-10 mg total) by mouth every 4 (four) hours as needed for moderate pain or severe pain. 01/23/20   Debbe Odea, MD  sevelamer carbonate (RENVELA) 800 MG tablet Take 1 tablet (800 mg total) by mouth 3 (three) times daily with meals. 01/23/20   Debbe Odea, MD  sodium chloride (OCEAN) 0.65 % SOLN nasal spray Place 1 spray into both nostrils as needed for congestion (nose irritation). 02/02/20   Bary Leriche, PA-C   CT ABDOMEN PELVIS W CONTRAST  Result Date: 02/07/2020 CLINICAL DATA:  Abdominal distension. Right flank edema. History of left hip infection. EXAM: CT ABDOMEN AND PELVIS WITH CONTRAST TECHNIQUE: Multidetector CT imaging of the abdomen and pelvis was performed using the standard protocol following bolus administration of intravenous contrast. CONTRAST:  115mL OMNIPAQUE IOHEXOL 300 MG/ML  SOLN COMPARISON:  CT dated May 07, 2005. FINDINGS: Lower chest: The lung bases are clear. The heart is enlarged. Hepatobiliary: The liver is normal. Status post cholecystectomy.There is no biliary ductal dilation. Pancreas: Normal contours without ductal dilatation. No peripancreatic fluid  collection. Spleen: Unremarkable. Adrenals/Urinary Tract: --Adrenal glands: Unremarkable. --Right kidney/ureter: No hydronephrosis or radiopaque kidney stones. --Left kidney/ureter: No hydronephrosis or radiopaque kidney stones. --Urinary bladder: Unremarkable. Stomach/Bowel: --Stomach/Duodenum: No hiatal hernia or other gastric abnormality. Normal duodenal course and caliber. --Small bowel: Unremarkable. --Colon: Rectosigmoid diverticulosis without acute inflammation. --Appendix: Normal. Vascular/Lymphatic: Atherosclerotic calcification is present within the non-aneurysmal abdominal aorta, without hemodynamically significant stenosis. --No retroperitoneal lymphadenopathy. --No mesenteric lymphadenopathy. --are enlarged left pelvic and left inguinal lymph nodes, likely reactive. Reproductive: Unremarkable Other: No ascites or free air. There is extensive bilateral flank subcutaneous edema with overlying skin thickening, right worse than left. Musculoskeletal. Again noted is a intratrochanteric fracture of the proximal left femur. There is no significant interval healing. The patient is status post prior intramedullary nail placement.1 there is a rim enhancing collection with a pocket of gas coursing from the superior aspect of the greater trochanter towards the lateral proximal thigh measuring approximately 8 cm in length. At the lesser trochanter there is a non rim enhancing 4 cm collection (axial series 3, image 98). There is surrounding soft tissue swelling. There is hypoattenuation of the gluteal musculature. There is some lucency involving the greater trochanter and adjacent fracture fragments. There is a healed fracture of the left inferior pubic ramus. IMPRESSION: 1. Nonspecific bilateral flank edema is noted, right worse than left. 2. There is a rim enhancing collection extending from the greater trochanter of the left hip towards the lateral aspect of the proximal left thigh, concerning for an abscess. 3.  There is a nonenhancing 4 cm collection adjacent to the lesser trochanter of the left hip which may represent a postoperative seroma or hematoma. 4. There are areas of lucency involving the proximal left femur which may be related to the patient's prior fracture, however in the setting of an adjacent abscess, osteomyelitis is not excluded. 5. Hypoattenuating areas throughout the left gluteal musculature is concerning for myositis. 6.  Aortic Atherosclerosis (ICD10-I70.0). Electronically Signed   By: Constance Holster M.D.   On: 02/07/2020  01:40    Positive ROS: All other systems have been reviewed and were otherwise negative with the exception of those mentioned in the HPI and as above.  Physical Exam: General: Alert, no acute distress Cardiovascular: No pedal edema Respiratory: No cyanosis, no use of accessory musculature GI: No organomegaly, abdomen is soft and non-tender Skin: Erythema, drainage, and swelling much improved over previous interval Neurologic: Sensation intact distally Psychiatric: Patient is competent for consent with normal mood and affect Lymphatic: No axillary or cervical lymphadenopathy  MUSCULOSKELETAL: LLE Erythema, drainage, and swelling much improved over previous interval  Assessment: Incision from I and D healing  Plan: Examined with Dr.Swinteck,. I and D incision has scant drainage. Erythema and swelling are greatly improved. CT scan reviewed. Findings are to be expected this close to the surgery. No intervention needed at this time.     Dorothyann Peng, PA [phone]    02/07/2020 1:28 PM

## 2020-02-07 NOTE — Consult Note (Signed)
[facility] for Infectious Disease    Date of Admission:  01/23/2020     Total days of antibiotics 20  Ertapenem                Reason for Consult: hip abscess     Referring Provider: Erling Cruz / [place]  Primary Care Provider: Julian Hy, PA-C     Assessment: [name] is a 54 y.o. female s/p L BKA following osteomyelitis and I&D of L hip surgical wound following IM fixation on treatment with once daily ertapenem. Operative cultures + enterobacter cloacae and bacteroides. She has done much better tolerating the ertapenem, thankfully. Unfortunately her CT scan is reading a pretty large (8 cm) rim enhancing fluid collection around the left trochanter. Orthopedic evaluation pending to determine further surgery to clean out vs IR drainage? She is stable currently and would like to continue therapy with Ertapenem pending further information from sampling. No mismatch between current therapy and previously recovered organisms. Explained to [name] it appears that we need better control over source of infection paired with ongoing prolonged antibiotic care.    Plan: 1. Continue Ertapenem  2. Follow up orthopedic recommendations, suspect she needs clean out 3. Would send anything sampled for routine cultures to help guide further recs     Principal Problem:   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HCC) Active Problems:   ESRD on dialysis [facility])   Sleep disturbance   Postoperative pain   Controlled type 1 diabetes mellitus with hyperglycemia (HCC)   Leukocytosis   Essential hypertension   Bacteremia   Hypoglycemia   . (feeding supplement) PROSource Plus  30 mL Oral BID BM  . alosetron  0.5 mg Oral BID  . amitriptyline  10 mg Oral QHS  . amLODipine  10 mg Oral Daily  . atorvastatin  40 mg Oral q1800  . carvedilol  12.5 mg Oral BID WC  . Chlorhexidine Gluconate Cloth  6 each Topical Q0600  . darbepoetin (ARANESP) injection - DIALYSIS  100 mcg  Intravenous Q Mon-HD  . DULoxetine  60 mg Oral Daily  . enoxaparin (LOVENOX) injection  30 mg Subcutaneous Q24H  . hydrALAZINE  100 mg Oral TID  . insulin aspart  0-5 Units Subcutaneous QHS  . insulin aspart  0-9 Units Subcutaneous TID WC  . insulin glargine  15 Units Subcutaneous QHS  . melatonin  1.5 mg Oral QHS  . morphine  15 mg Oral Q12H  . multivitamin  1 tablet Oral QHS  . pantoprazole  40 mg Oral Daily  . senna-docusate  1 tablet Oral BID  . sevelamer carbonate  800 mg Oral TID WC    HPI: [name] is a 54 y.o. female admitted to [facility] following BKA (01/12/20) of the left leg d/t osteomyelitis. Previously underwent intramedullary fixation with Dr. Lyla Glassing on 12/29/19 with development of drainage from incision. This required I&D on 11/18 >> all original sutures were removed as well as a small amount of necrotic muscle tissue; appeared to involve subcutaneous tissue at that time w/o any tunneling or communication identified.  Intraoperative cultures grew out bacteroides and enterobacter cloacea --> initially narrowed to cefepime + metronidazole, this was changed to once daily ertapenem d/t intractable nausea and intolerance for metronidazole.   [name] has had no problems with current antibiotics. No concern with fevers, chills or unexplained sweating episodes. She noticed some pretty sudden increase in the swelling and pain overlying the  posterior and lateral left hip and flank. CT scan was done revealing a rim enhancing collection from the greater trochanter of the L hip towards the lateral aspect of the left thigh concerning for abscess. There are areas of lucency involving the proximal left femur which may be related to the patient's prior fracture, however in the setting of an adjacent abscess, osteomyelitis is not excluded   Review of Systems: Review of Systems  Constitutional: Negative for chills and fever.  Respiratory: Negative for cough.   Cardiovascular: Positive  for leg swelling. Negative for chest pain, orthopnea and claudication.  Gastrointestinal: Negative for abdominal pain, diarrhea and vomiting.  Genitourinary: Negative for dysuria.  Musculoskeletal: Positive for joint pain (L hip pain). Negative for back pain.  Skin: Negative for rash.    Past Medical History:  Diagnosis Date  . Anxiety 2002  . Chest tightness   . Depression 2001  . Diabetes type 1, uncontrolled ([place])    at age 89  . Diabetic neuropathy ([place])   . Essential hypertension 2015  . GERD (gastroesophageal reflux disease)    about age of 25  . Nausea and vomiting in adult   . Stroke ([place])   . Urinary frequency   . Yeast vaginitis     Social History   Tobacco Use  . Smoking status: Light Tobacco Smoker    Packs/day: 0.25    Years: 22.00    Pack years: 5.50    Types: Cigarettes  . Smokeless tobacco: Never Used  Vaping Use  . Vaping Use: Never used  Substance Use Topics  . Alcohol use: Yes    Alcohol/week: 0.0 standard drinks    Comment: sociial  . Drug use: No    Family History  Problem Relation Age of Onset  . Diabetes Mother   . Heart attack Mother   . Heart disease Mother        before age 36  . Hypertension Mother   . Hyperlipidemia Mother   . Diabetes Father   . Heart attack Father   . Heart disease Father        before age 74  . Diabetes Sister   . Hypertension Sister    Allergies  Allergen Reactions  . Trazodone Swelling  . Latex Rash  . Lidocaine Itching    OBJECTIVE: Blood pressure (!) 139/59, pulse 60, temperature 97.6 F (36.4 C), resp. rate 17, height 5\' 10"  (1.778 m), weight 88 kg, SpO2 96 %.  Physical Exam Vitals and nursing note reviewed.  Constitutional:      Comments: Sitting comfortably in bed. Waiting on PT to come.   HENT:     Mouth/Throat:     Mouth: Mucous membranes are moist.     Pharynx: Oropharynx is clear. No oropharyngeal exudate.  Eyes:     General: No scleral icterus. Cardiovascular:     Rate and  Rhythm: Normal rate and regular rhythm.     Pulses: Normal pulses.     Heart sounds: No murmur heard.   Pulmonary:     Effort: Pulmonary effort is normal.     Breath sounds: Normal breath sounds.  Abdominal:     General: Bowel sounds are normal.     Palpations: Abdomen is soft.  Skin:    General: Skin is warm and dry.     Capillary Refill: Capillary refill takes less than 2 seconds.     Comments: Blanchable erythema to left flank and down to hip crest. Dressing overlying posterior surgical incision  is clean/dry. Lateral left hip incision appears well healed. Diffuse induration/swelling noted throughout posterior hip/flank/to anterior thigh.   Neurological:     Mental Status: She is alert and oriented to person, place, and time.     Lab Results Lab Results  Component Value Date   WBC 12.0 (H) 02/06/2020   HGB 10.0 (L) 02/06/2020   HCT 31.5 (L) 02/06/2020   MCV 88.7 02/06/2020   PLT 287 02/06/2020    Lab Results  Component Value Date   CREATININE 7.60 (H) 02/06/2020   BUN 52 (H) 02/06/2020   NA 127 (L) 02/06/2020   K 4.4 02/06/2020   CL 89 (L) 02/06/2020   CO2 28 02/06/2020    Lab Results  Component Value Date   ALT 6 01/07/2020   AST 21 01/07/2020   ALKPHOS 223 (H) 01/07/2020   BILITOT 0.7 01/07/2020     Microbiology: No results found for this or any previous visit (from the past 240 hour(s)).  Janene Madeira, MSN, NP-C [facility] for Infectious [place] Cell: [phone] Pager: [phone]  02/07/2020 1:15 PM

## 2020-02-07 NOTE — Progress Notes (Signed)
Physical Therapy Session Note  Patient Details  Name: [name] MRN: [id number] Date of Birth: 07-08-1965  Today's Date: 02/07/2020 PT Individual Time: 1300-1415 PT Individual Time Calculation (min): 75 min   Short Term Goals: Week 1:  PT Short Term Goal 1 (Week 1): Patient will perform bed mobility supine to sit with min A; sit to supine with mod A PT Short Term Goal 1 - Progress (Week 1): Met PT Short Term Goal 2 (Week 1): Patient will perform bed to chair transfers with slide board and mod A PT Short Term Goal 2 - Progress (Week 1): Met PT Short Term Goal 3 (Week 1): Patient to propel w/c with S x 100' PT Short Term Goal 3 - Progress (Week 1): Met PT Short Term Goal 4 (Week 1): patient to perform sit to stand with max A of 1 PT Short Term Goal 4 - Progress (Week 1): Met Week 2:  PT Short Term Goal 1 (Week 2): Patient will demonstrate standing balance with CGA x 2 minutes with 1-2 UE support in prep for standing ADL. PT Short Term Goal 2 (Week 2): Patient will perform slide board transfers with close S and min cues including w/c set up and board plaement. PT Short Term Goal 3 (Week 2): Patient will perform sit to stand consistently with mod A. PT Short Term Goal 4 (Week 2): Patient to initiate gait traning in parallel bars with mod A Week 3:     Skilled Therapeutic Interventions/Progress Updates:     PAIN 7/10 L hip, treatment to tolerance, rest breaks as needed Pt initially supine, c/o discomfort from acewrap.  Noted pt w/kerlix, acewrap, and shrinker.  Discussed not needing acewrap/transition to just shrinker.  Pt voiced understanding.  Removed acewrap, reapplied shrinker to extend above knee.  Limbguard applied by therapist.   Pt supine to sit w/rail and supervision. sbt to R side w/set up and cga. wc propulsion x  299f including backing and turning w/supervision  Attempted Sit to stand at stairs/parallel bars unavail, unable w/max assist of 1.  Pt stated current  sliding board too long and thick.  Provided w/short/thin board. wc to mat w/uphill bias w/min assist.    Preprosthetic excercises Seated LAQs x 20 sideliying hip extenstion 2x10 slidelying hip abd 2x10 (difficult) Supine TKEs x20 Supine SLR x 12 Seated pressups using yoga blocks 2x10  Mat to wc w/set up and cga, cues for technique, downhill bias.  wc propulsion x 1072fmod I including negotiating tight spaces in room.  wc to bed uphill bias sliding board transfer w/verbal cues and mod assist for set up, min assist and cues for transfer.  Improved head/hips relationship demonstrated w/this effort.  Sit to supine w/min assist.  Pt left supine w/rails up x 4, alarm set, bed in lowest position, and needs in reach.  Therapy Documentation Precautions:  Precautions Precautions: Fall Precaution Comments: new L BKA Required Braces or Orthoses: Other Brace Other Brace: L LE limb guard Restrictions Weight Bearing Restrictions: Yes LLE Weight Bearing: Non weight bearing    Therapy/Group: Individual Therapy  BaCallie FieldingPTGreenwood2/09/2019, 4:01 PM

## 2020-02-08 ENCOUNTER — Inpatient Hospital Stay (HOSPITAL_COMMUNITY): Payer: Medicaid Other

## 2020-02-08 ENCOUNTER — Inpatient Hospital Stay (HOSPITAL_COMMUNITY): Payer: Medicaid Other | Admitting: Occupational Therapy

## 2020-02-08 DIAGNOSIS — S88112A Complete traumatic amputation at level between knee and ankle, left lower leg, initial encounter: Secondary | ICD-10-CM | POA: Diagnosis not present

## 2020-02-08 DIAGNOSIS — B952 Enterococcus as the cause of diseases classified elsewhere: Secondary | ICD-10-CM

## 2020-02-08 DIAGNOSIS — L02416 Cutaneous abscess of left lower limb: Secondary | ICD-10-CM

## 2020-02-08 LAB — RENAL FUNCTION PANEL
Albumin: 2.2 g/dL — ABNORMAL LOW (ref 3.5–5.0)
Anion gap: 10 (ref 5–15)
BUN: 42 mg/dL — ABNORMAL HIGH (ref 6–20)
CO2: 24 mmol/L (ref 22–32)
Calcium: 8.4 mg/dL — ABNORMAL LOW (ref 8.9–10.3)
Chloride: 90 mmol/L — ABNORMAL LOW (ref 98–111)
Creatinine, Ser: 6.47 mg/dL — ABNORMAL HIGH (ref 0.44–1.00)
GFR, Estimated: 7 mL/min — ABNORMAL LOW (ref >60)
Glucose, Bld: 85 mg/dL (ref 70–99)
Phosphorus: 5 mg/dL — ABNORMAL HIGH (ref 2.5–4.6)
Potassium: 4.3 mmol/L (ref 3.5–5.1)
Sodium: 124 mmol/L — ABNORMAL LOW (ref 135–145)

## 2020-02-08 LAB — CBC
HCT: 31.4 % — ABNORMAL LOW (ref 36.0–46.0)
Hemoglobin: 9.6 g/dL — ABNORMAL LOW (ref 12.0–15.0)
MCH: 27.4 pg (ref 26.0–34.0)
MCHC: 30.6 g/dL (ref 30.0–36.0)
MCV: 89.7 fL (ref 80.0–100.0)
Platelets: 212 10*3/uL (ref 150–400)
RBC: 3.5 MIL/uL — ABNORMAL LOW (ref 3.87–5.11)
RDW: 15.3 % (ref 11.5–15.5)
WBC: 12.1 10*3/uL — ABNORMAL HIGH (ref 4.0–10.5)
nRBC: 0 % (ref 0.0–0.2)

## 2020-02-08 LAB — GLUCOSE, CAPILLARY
Glucose-Capillary: 83 mg/dL (ref 70–99)
Glucose-Capillary: 78 mg/dL (ref 70–99)
Glucose-Capillary: 95 mg/dL (ref 70–99)
Glucose-Capillary: 161 mg/dL — ABNORMAL HIGH (ref 70–99)

## 2020-02-08 MED ORDER — HEPARIN SODIUM (PORCINE) 1000 UNIT/ML DIALYSIS: 1000.0000 [IU] | INTRAMUSCULAR | Status: DC | PRN

## 2020-02-08 MED ORDER — PENTAFLUOROPROP-TETRAFLUOROETH EX AERO: 1.0000 "application " | INHALATION_SPRAY | CUTANEOUS | Status: DC | PRN

## 2020-02-08 MED ORDER — HEPARIN SODIUM (PORCINE) 1000 UNIT/ML DIALYSIS: 2000.0000 [IU] | INTRAMUSCULAR | Status: DC | PRN

## 2020-02-08 MED ORDER — SEVELAMER CARBONATE 800 MG PO TABS
1600.0000 mg | ORAL_TABLET | Freq: Three times a day (TID) | ORAL | Status: DC
  Administered 2020-02-08 – 2020-02-21 (×35): 1600 mg via ORAL
  Filled 2020-02-08 (×38): qty 2

## 2020-02-08 MED ORDER — LIDOCAINE HCL (PF) 1 % IJ SOLN: 5.0000 mL | INTRAMUSCULAR | Status: DC | PRN

## 2020-02-08 MED ORDER — SODIUM CHLORIDE 0.9 % IV SOLN: 100.0000 mL | INTRAVENOUS | Status: DC | PRN

## 2020-02-08 MED ORDER — ALTEPLASE 2 MG IJ SOLR: 2.0000 mg | Freq: Once | INTRAMUSCULAR | Status: DC | PRN

## 2020-02-08 MED ORDER — AMLODIPINE BESYLATE 5 MG PO TABS
5.0000 mg | ORAL_TABLET | Freq: Every day | ORAL | Status: DC
  Administered 2020-02-09 – 2020-02-21 (×11): 5 mg via ORAL
  Filled 2020-02-08 (×12): qty 1

## 2020-02-08 MED ORDER — DULOXETINE HCL 30 MG PO CPEP
30.0000 mg | ORAL_CAPSULE | Freq: Every day | ORAL | Status: DC
  Administered 2020-02-08 – 2020-02-21 (×14): 30 mg via ORAL
  Filled 2020-02-08 (×13): qty 1

## 2020-02-08 MED ORDER — CITALOPRAM HYDROBROMIDE 10 MG PO TABS
20.0000 mg | ORAL_TABLET | Freq: Every day | ORAL | Status: DC
  Administered 2020-02-08 – 2020-02-21 (×14): 20 mg via ORAL
  Filled 2020-02-08 (×14): qty 2

## 2020-02-08 MED ORDER — HEPARIN SODIUM (PORCINE) 1000 UNIT/ML DIALYSIS: 20.0000 [IU]/kg | INTRAMUSCULAR | Status: DC | PRN

## 2020-02-08 MED ORDER — ALOSETRON HCL 1 MG PO TABS
1.0000 mg | ORAL_TABLET | Freq: Two times a day (BID) | ORAL | Status: DC
  Administered 2020-02-08 – 2020-02-21 (×27): 1 mg via ORAL
  Filled 2020-02-08 (×30): qty 1

## 2020-02-08 MED ORDER — LIDOCAINE-PRILOCAINE 2.5-2.5 % EX CREA
1.0000 "application " | TOPICAL_CREAM | CUTANEOUS | Status: DC | PRN
  Filled 2020-02-08: qty 5

## 2020-02-08 NOTE — Progress Notes (Signed)
Physical Therapy Weekly Progress Note  Patient Details  Name: [name] MRN: [id number] Date of Birth: May 09, 1965  Beginning of progress report period: February 01, 2020 End of progress report period: February 08, 2020  Today's Date: 02/08/2020 PT Individual Time: 8867-7373 PT Individual Time Calculation (min): 55 min   Patient has met 1 of 4 short term goals.  Pt has met SBT with S and min cues goal. Pt progress towards goals is slowed by her LLE pain and L hip pain, as well as L hip edema.   Patient continues to demonstrate the following deficits decreased standing balance, decreased gait stability, decreased sit to stand balance and therefore will continue to benefit from skilled PT intervention to increase functional independence with mobility.  Patient progressing toward long term goals..  Continue plan of care.  PT Short Term Goals  Week 3:  PT Short Term Goal 1 (Week 3): Pt to initiate gait training in parallel bars with mod A PT Short Term Goal 2 (Week 3): Pt to perform sit to stand consistently with Mod A x1 PT Short Term Goal 3 (Week 3): Patient will demonstrate standing balance with CGA x 2 minutes with 1-2 UE support in prep for standing ADL PT Short Term Goal 4 (Week 3): Pt will lateral scoot left and right independent on EOB without cuing for head hips relationship  Skilled Therapeutic Interventions/Progress Updates:  Ambulation/gait training;Balance/vestibular training;DME/adaptive equipment instruction;Neuromuscular re-education;UE/LE Strength taining/ROM;Wheelchair propulsion/positioning;Therapeutic Activities;Therapeutic Exercise;Patient/family education;Functional mobility training;Disease management/prevention;Pain management;Skin care/wound management.  Pt presented today with 7/10 L knee pain upon PT entry to room. Pt was premedicated before PT treatment. Pt then performed SLR BLE x10 with mod A for LLE. Pt demonstrated supervision for bed mobility and S to min A  for LLE management during lateral scooting sitting EOB requiring mod cues for head hips relationship. Pt performed SBT with S with no cuing. Pt then was wheeled in w/c towards gym for energy conservation, but upon transport, reported increased stomach pain and need to return to room. Pt was wheeled back to the room and performed sit to stand transfer to RW and stand to sit to bedside commode with max A x2. Pt able to perform pericare by weight shifting to the R with CGA for safety. Pt then performed STS transfer ending in w/c with the same assistance as before in the session. Pt left in w/c to receive therapy coming in 30 minutes after PT left. Pt continues to have decreased endurance, decreased standing balance, decreased transfers, and decreased LLE strength, and continued LLE and L hip pain. Pt would still benefit from skilled physical therapy to address these deficits.    Therapy Documentation Precautions:  Precautions Precautions: Fall Precaution Comments: new L BKA Required Braces or Orthoses: Other Brace Other Brace: L LE limb guard Restrictions Weight Bearing Restrictions: Yes LLE Weight Bearing: Non weight bearing  Pain: Pain Assessment Pain Scale: 0-10 Pain Score: 0-No pain Vision/Perception       Therapy/Group: Individual Therapy    Dulce Sellar, SPT  Dulce Sellar 02/08/2020, 4:25 PM

## 2020-02-08 NOTE — Progress Notes (Signed)
Occupational Therapy Session Note  Patient Details  Name: [name] MRN: [id number] Date of Birth: December 12, 1965  Today's Date: 02/08/2020 OT Individual Time: 2641-5830 OT Individual Time Calculation (min): 60 min    Short Term Goals: Week 2:  OT Short Term Goal 1 (Week 2): Pt will perform BSC/toilet transfer with Mod of 1 OT Short Term Goal 2 (Week 2): Pt will perform LB dress EOB level with Mod A and lateral leans with AE PRN OT Short Term Goal 3 (Week 2): Pt will perform sit to stand Mod A in prep for LB ADL   Skilled Therapeutic Interventions/Progress Updates:    Pt greeted at time of session supine in bed resting agreeable to OT session, no pain initially but did have hip discomfort with mobility no number provided but rest breaks PRN. Supine to sit Supervision and planned to attempt squat pivot to [facility] but pt preferring to use slide board. Slide board transfer bed > drop arm BSC CGA with extended time, cues to lift buttocks but difficulty performing, Max A to doff shorts and brief with lateral leans and pushing through arms to lift buttocks, multiple attempts required. Pt able to wash periarea sitting on commode but total A for posterior hygiene. LB dressing sitting on BSC with reacher to thread with Min A and multiple attempts to don over hips with lifting buttocks and trialing lateral leans but unable to clear, slide board back to bed with towel over slide board with Min/Mod A d/t hip discomfort and difficulty weight shifting. Max A to don back over hips with lateral leans EOB. UB bathe and dress with Set up/supervision at EOB for time conservation. Oral hygiene as well with set up. Sit to supine supervision with alarm on, call bell in reach.   Therapy Documentation Precautions:  Precautions Precautions: Fall Precaution Comments: new L BKA Required Braces or Orthoses: Other Brace Other Brace: L LE limb guard Restrictions Weight Bearing Restrictions: Yes LLE Weight Bearing: Non  weight bearing     Therapy/Group: Individual Therapy  [name] 02/08/2020, 7:57 AM

## 2020-02-08 NOTE — Progress Notes (Signed)
Physical Therapy Session Note  Patient Details  Name: [name] MRN: [id number] Date of Birth: [birth year]  Today's Date: 02/08/2020 PT Individual Time: 1102-1200 PT Individual Time Calculation (min): 58 min   Short Term Goals: Week 2:  PT Short Term Goal 1 (Week 2): Patient will demonstrate standing balance with CGA x 2 minutes with 1-2 UE support in prep for standing ADL. PT Short Term Goal 1 - Progress (Week 2): Progressing toward goal PT Short Term Goal 2 (Week 2): Patient will perform slide board transfers with close S and min cues including w/c set up and board plaement. PT Short Term Goal 2 - Progress (Week 2): Met PT Short Term Goal 3 (Week 2): Patient will perform sit to stand consistently with mod A. PT Short Term Goal 3 - Progress (Week 2): Progressing toward goal PT Short Term Goal 4 (Week 2): Patient to initiate gait traning in parallel bars with mod A PT Short Term Goal 4 - Progress (Week 2): Progressing toward goal  Skilled Therapeutic Interventions/Progress Updates:     Pt received seated in [facility] and agrees to therapy. Reports pain in L hip and having taken pain meds prior to therapy. PT provides rest breaks and repositioning to manage pain symptoms. WC transport to gym for time management. Pt performs slideboard transfer to mat table with minA and cues on head hip relationship and hand placement. Pt performs strengthening and transfer training from elevated mat table, performing x2 reps of sit to stand with RW. Pt requires maxA and extensive cueing and education on anterior weight shift to complete successfully. Pt able to maintain standing 1-2 minutes each bout. Pt demos posterior center of gravity and has difficulty shifting weight forward to improve balance. Pt requires extended seated rest breaks after each bout. PT provides modA in standing due to retropulsion and also attempting to encourage hip and knee extension. Pt noted to flex knee when attempting to push hips  forward to shifting COG anteriorly. Pt cued to lay supine with R leg hanging from mat table to provide hip flexor stretch. Following, pt attempts to perform sit to stand with [place]. Pt requires maxA +2 for sit to stand into [place], and is then unable to achieve standing position. Fatigue, poor hip strength, and impaired motor planning may contribute to pt's difficulty in performing sit to stand. Total +2 to eventually achieve full standing to assist pt out of [place]. Slideboard transfer to [facility] with minA. Pt then self propels WC x100' with supervision and cues for increased arm extension to improve propulsion technique. MinA for slideboard transfer back to bed, and minA for sit to supine. Pt left supine in bed with alarm intact and all needs within reach.  Therapy Documentation Precautions:  Precautions Precautions: Fall Precaution Comments: new L BKA Required Braces or Orthoses: Other Brace Other Brace: L LE limb guard Restrictions Weight Bearing Restrictions: Yes LLE Weight Bearing: Non weight bearing   Therapy/Group: Individual Therapy  Breck Coons, PT, DPT 02/08/2020, 3:39 PM

## 2020-02-08 NOTE — Progress Notes (Signed)
[facility] PHYSICAL MEDICINE & REHABILITATION PROGRESS NOTE   Subjective/Complaints:   Pt reported that "back on IV ABX"-  More depressed due to complications.     ROS:   Pt denies SOB, abd pain, CP, N/V/C/D, and vision changes   Objective:   CT ABDOMEN PELVIS W CONTRAST  Result Date: 02/07/2020 CLINICAL DATA:  Abdominal distension. Right flank edema. History of left hip infection. EXAM: CT ABDOMEN AND PELVIS WITH CONTRAST TECHNIQUE: Multidetector CT imaging of the abdomen and pelvis was performed using the standard protocol following bolus administration of intravenous contrast. CONTRAST:  163mL OMNIPAQUE IOHEXOL 300 MG/ML  SOLN COMPARISON:  CT dated May 07, 2005. FINDINGS: Lower chest: The lung bases are clear. The heart is enlarged. Hepatobiliary: The liver is normal. Status post cholecystectomy.There is no biliary ductal dilation. Pancreas: Normal contours without ductal dilatation. No peripancreatic fluid collection. Spleen: Unremarkable. Adrenals/Urinary Tract: --Adrenal glands: Unremarkable. --Right kidney/ureter: No hydronephrosis or radiopaque kidney stones. --Left kidney/ureter: No hydronephrosis or radiopaque kidney stones. --Urinary bladder: Unremarkable. Stomach/Bowel: --Stomach/Duodenum: No hiatal hernia or other gastric abnormality. Normal duodenal course and caliber. --Small bowel: Unremarkable. --Colon: Rectosigmoid diverticulosis without acute inflammation. --Appendix: Normal. Vascular/Lymphatic: Atherosclerotic calcification is present within the non-aneurysmal abdominal aorta, without hemodynamically significant stenosis. --No retroperitoneal lymphadenopathy. --No mesenteric lymphadenopathy. --are enlarged left pelvic and left inguinal lymph nodes, likely reactive. Reproductive: Unremarkable Other: No ascites or free air. There is extensive bilateral flank subcutaneous edema with overlying skin thickening, right worse than left. Musculoskeletal. Again noted is a  intratrochanteric fracture of the proximal left femur. There is no significant interval healing. The patient is status post prior intramedullary nail placement.1 there is a rim enhancing collection with a pocket of gas coursing from the superior aspect of the greater trochanter towards the lateral proximal thigh measuring approximately 8 cm in length. At the lesser trochanter there is a non rim enhancing 4 cm collection (axial series 3, image 98). There is surrounding soft tissue swelling. There is hypoattenuation of the gluteal musculature. There is some lucency involving the greater trochanter and adjacent fracture fragments. There is a healed fracture of the left inferior pubic ramus. IMPRESSION: 1. Nonspecific bilateral flank edema is noted, right worse than left. 2. There is a rim enhancing collection extending from the greater trochanter of the left hip towards the lateral aspect of the proximal left thigh, concerning for an abscess. 3. There is a nonenhancing 4 cm collection adjacent to the lesser trochanter of the left hip which may represent a postoperative seroma or hematoma. 4. There are areas of lucency involving the proximal left femur which may be related to the patient's prior fracture, however in the setting of an adjacent abscess, osteomyelitis is not excluded. 5. Hypoattenuating areas throughout the left gluteal musculature is concerning for myositis. 6.  Aortic Atherosclerosis (ICD10-I70.0). Electronically Signed   By: Constance Holster M.D.   On: 02/07/2020 01:40   Recent Labs    02/06/20 1520  WBC 12.0*  HGB 10.0*  HCT 31.5*  PLT 287   Recent Labs    02/06/20 1520  NA 127*  K 4.4  CL 89*  CO2 28  GLUCOSE 103*  BUN 52*  CREATININE 7.60*  CALCIUM 8.5*    Intake/Output Summary (Last 24 hours) at 02/08/2020 0752 Last data filed at 02/08/2020 0737 Gross per 24 hour  Intake 672 ml  Output --  Net 672 ml     Pressure Injury 01/23/20 Buttocks Left Deep Tissue Pressure  Injury - Purple or maroon  localized area of discolored intact skin or blood-filled blister due to damage of underlying soft tissue from pressure and/or shear. Circular, dark, red area, surrounde (Active)  01/23/20 1609  Location: Buttocks  Location Orientation: Left  Staging: Deep Tissue Pressure Injury - Purple or maroon localized area of discolored intact skin or blood-filled blister due to damage of underlying soft tissue from pressure and/or shear.  Wound Description (Comments): Circular, dark, red area, surrounded by MASD  Present on Admission: Yes    Physical Exam: Vital Signs Blood pressure 139/66, pulse (!) 59, temperature 98.4 F (36.9 C), temperature source Oral, resp. rate 17, height 5\' 10"  (1.778 m), weight 89.4 kg, SpO2 99 %. Constitutional: sitting up in bed- appropriate, but more depressed, NAD HEENT: EOMI, oral membranes moist Neck: supple Cardiovascular: RRR Respiratory/Chest: CTA B/L- no W/R/R- good air movement GI/Abdomen: Soft, NT, ND, (+)BS  Ext: no clubbing, cyanosis, or edema Psych: depressed Skin: Warm and dry.  L BKA-  Well shaped with shrinker.  L hip looks great- sutures out-steristrips-  L thigh is VERY swollen and L hip incision is draining slightly- through dressing- serosanguinous drainage.  Musc: Left BKA with mild edema and tenderness Right lower extremity edema, stable Neuro: Alert Motor: Right lower extremity: Hip flexion, knee extension 4-5/5 (pain inhibition), ankle dorsiflexion 4+/5, stable exam Left lower extremity: Hip flexion, knee extension 3/5 (some pain inhibition), ankle dorsiflexion 4/5   Assessment/Plan: 1. Functional deficits which require 3+ hours per day of interdisciplinary therapy in a comprehensive inpatient rehab setting.  Physiatrist is providing close team supervision and 24 hour management of active medical problems listed below.  Physiatrist and rehab team continue to assess barriers to discharge/monitor patient progress  toward functional and medical goals  Care Tool:  Bathing    Body parts bathed by patient: Right arm, Left arm, Chest, Abdomen, Face, Right upper leg, Left upper leg   Body parts bathed by helper: Buttocks, Right lower leg (RLE for thoroughness) Body parts n/a: Left lower leg   Bathing assist Assist Level: Contact Guard/Touching assist (declined washing periarea and buttocks today, would need more assist with this)     Upper Body Dressing/Undressing Upper body dressing   What is the patient wearing?: Pull over shirt    Upper body assist Assist Level: Supervision/Verbal cueing    Lower Body Dressing/Undressing Lower body dressing      What is the patient wearing?: Pants, Underwear/pull up     Lower body assist Assist for lower body dressing: Moderate Assistance - Patient 50 - 74%     Toileting Toileting Toileting Activity did not occur (Clothing management and hygiene only): N/A (no void or bm)  Toileting assist Assist for toileting: Total Assistance - Patient < 25% (drop arm BSC) Assistive Device Comment: female urinal   Transfers Chair/bed transfer  Transfers assist  Chair/bed transfer activity did not occur: Safety/medical concerns  Chair/bed transfer assist level: Minimal Assistance - Patient > 75% Chair/bed transfer assistive device: Sliding board   Locomotion Ambulation   Ambulation assist   Ambulation activity did not occur: Safety/medical concerns          Walk 10 feet activity   Assist  Walk 10 feet activity did not occur: Safety/medical concerns        Walk 50 feet activity   Assist Walk 50 feet with 2 turns activity did not occur: Safety/medical concerns         Walk 150 feet activity   Assist Walk 150 feet activity did not  occur: Safety/medical concerns         Walk 10 feet on uneven surface  activity   Assist Walk 10 feet on uneven surfaces activity did not occur: Safety/medical concerns          Wheelchair     Assist Will patient use wheelchair at discharge?: Yes Type of Wheelchair: Manual    Wheelchair assist level: Supervision/Verbal cueing Max wheelchair distance: 200    Wheelchair 50 feet with 2 turns activity    Assist        Assist Level: Supervision/Verbal cueing   Wheelchair 150 feet activity     Assist      Assist Level: Supervision/Verbal cueing   Medical Problem List and Plan: 1.  Impaired mobility and ADLs secondary to L BKA  Continue CIR 2.  Antithrombotics: -DVT/anticoagulation:  Pharmaceutical: Lovenox             -antiplatelet therapy: N/A 3. Chronic pain/Pain Management: Was on oxycodone 5 mg tid prn as well Cymbalta for neuropathy (at baseline)- now on 10-15 mg q4 hours prn  12/2- will start MS Contin 15 mg BID  12/3-5- MUCH improved pain for pt- con't MS Contin  12/8- pain doing well on MS Contin- con't regimen 4. Mood: Team to provide ego support/encouragement.              -antipsychotic agents: N/A 5. Neuropsych: This patient is capable of making decisions on her own behalf. 6. Skin/Wound Care: Monitor wound daily   Continue stump shrinker   11/30- will call Vascular on Thursday about getting sutures out of L BKA- has been 4 weeks.   12/3- L/M- with vascular  12/5 vascular hasn't seen pt yet--primary team will need to reach out again tomorrow  12/6- will check with vascular if can get staples out-  7. Fluids/Electrolytes/Nutrition: Strict I/O. 1200 cc FR. Will consult dietician to educate on renal diet.  8. Left hip ORIF/hip abscess s/p I&D 11/18: NWB LLE.   Flagyl changed to Ertepenem due to patient refusal, continue  12/1- on Ertepenem until end of December.  12/7- pt has new rim enhancing lesions in L hip, thigh/glute, etc- have called Ortho and also calling ID to discuss- might have been since switched off Flagyl?  9. 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 T1DM with neuropathy:  Hgb A1c-6.2. Was on Lantus 50 units in am with 10 units novolog tid.     Lantus 38 twice daily, decreased to 10 on 11/27, increased to 15 on 11/28  NovoLog 5 units with breakfast, 8 units with lunch and dinner, decreased to 5 3 times daily on 11/27, DC'd on 11/27.  12/8- Had hypoglycemia x1- of 69- con't regimen for now 11. Now ESRD: Now on HD MWF (set up at [facility] post discharge)  12. Anemia of chronic disease/iron deficiency: Hgb-6.3 at admission--->9.1 s/p 4 units PRBC. Feraheme X 1 (additional dose held due to infection) and Aranesp weekly.   Hemoglobin 9.7 on 11/26  Continue to monitor 13. HTN: Monitor BP tid--continue Coreg bid, Norvasc and apresoline tid.   Decreased Coreg to 12.5mg  BID.   12/5 BP controlled 14. IBS-D:   MiraLAX DC'd  Resumed Lotrenex    Colace as needed ordered  12/2- will make colace scheduled and add sorbitol if no BM by 3pm  12/4 changed colace to senna-s given addn of MS Contin  12/6- refusing laxatives- but had BM Saturday and this AM, so  con't regimen 15. Malnutrition: Continue protein supplement.  16. Abdominal pain: Due to diarrhea? PPI resumed-->patient to have family bring in [place] from home.  17.  Sleep disturbance:   Low dose elavil at bedtime.  Melatonin added on 11/26  Improving overall 18. Leukocytosis:   WBCs 11.6 on 11/26, labs with HD  See #8, #9  Afebrile  12/3- WBC 11.1- afebrile; no Sx's- con't to monitor  12/6- WBC 11.3- cause??  12/7- WBC 12.0- due to abscesses in L thigh, glute, etc causing edema- have contacted Ortho AND ID- waiting to hear back.  19. LE/thigh edema  12/6- will check CT of abd/pelvis with contrast to determine cause-got OK by nephrology to use contrast- has 2-3+ edema of thighs L>R  12/7- due to rim enhancing lesions- as above.   12/8- will con't Ertepenem- per ID.  20. Depression-new-  12/8- will add Celexa 20 mg daily- max DOSE   LOS: 16 days A FACE TO FACE EVALUATION WAS PERFORMED  Jendaya Gossett 02/08/2020, 7:52 AM

## 2020-02-08 NOTE — Progress Notes (Signed)
[facility]  Date of Admission:  01/23/2020           Current antibiotics: Ertapenem  Reason for visit: Follow up on E. Cloacae and Bactereoides wound infection, abscess  Interval events: No acute events noted Afebrile Stable leukocytosis   ASSESSMENT:    # Hip fracture s/p intramedullary fixation 01/74/94 # Complicated by wound infection s/p I&D 01/19/20; cultures (+) for Bacteroides and E cloacae.  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 (feeding supplement) PROSource Plus  30 mL Oral BID BM  . alosetron  0.5 mg Oral BID  . amitriptyline  10 mg Oral QHS  . amLODipine  10 mg Oral Daily  . atorvastatin  40 mg Oral q1800  . carvedilol  12.5 mg Oral BID WC  . Chlorhexidine Gluconate Cloth  6 each Topical Q0600  . darbepoetin (ARANESP) injection - DIALYSIS  100 mcg Intravenous Q Mon-HD  . DULoxetine  60 mg Oral Daily  . enoxaparin (LOVENOX) injection  30 mg Subcutaneous Q24H  . hydrALAZINE  100 mg Oral TID  . insulin aspart  0-5 Units Subcutaneous QHS  . insulin aspart  0-9 Units Subcutaneous TID WC  . insulin glargine  15 Units Subcutaneous QHS  . melatonin  1.5 mg Oral QHS  . morphine  15 mg Oral Q12H  . multivitamin  1 tablet Oral QHS  . pantoprazole  40 mg Oral Daily  . senna-docusate  1 tablet Oral BID  . sevelamer carbonate  800 mg Oral TID WC    Continuous Infusions: . sodium chloride 250 mL (02/05/20 1749)  . ertapenem 500 mg (02/07/20 1724)    PRN Meds: sodium chloride, acetaminophen, bisacodyl, calcium carbonate, cyclobenzaprine, diphenhydrAMINE, guaiFENesin-dextromethorphan, lip balm, loratadine, Muscle Rub, oxyCODONE, [DISCONTINUED] menthol-cetylpyridinium **OR** phenol, polyvinyl alcohol,  prochlorperazine **OR** prochlorperazine **OR** prochlorperazine, sodium chloride, traMADol  SUBJECTIVE:   No fevers, chills.  Tolerating current Rx.  Awaiting further plans for abscess.  No other new complaints.   Review of Systems: As noted above.  All other systems reviewed and are negative.   OBJECTIVE:   Allergies  Allergen Reactions  . Trazodone Swelling  . Latex Rash  . Lidocaine Itching    Blood pressure 139/66, pulse (!) 59, temperature 98.4 F (36.9 C), temperature source Oral, resp. rate 17, height 5\' 10"  (1.778 m), weight 89.4 kg, SpO2 99 %. Body mass index is 28.28 kg/m.  Physical Exam Constitutional:      General: She is not in acute distress.    Appearance: Normal appearance.  HENT:     Head: Normocephalic and atraumatic.  Pulmonary:     Effort: Pulmonary effort is normal. No respiratory distress.  Skin:    Comments: Dressing overlying posterior surgical incision is clean/dry. Lateral left hip incision appears well healed.  Induration/swelling noted throughout posterior hip/flank/to anterior thigh.    Neurological:     General: No focal deficit present.     Mental Status: She is alert and oriented to person, place, and time.  Psychiatric:        Mood and Affect: Mood normal.        Behavior: Behavior normal.     Lines: Tunneled right IJ HD cath   Lab Results & Microbiology Lab  Results  Component Value Date   WBC 12.0 (H) 02/06/2020   HGB 10.0 (L) 02/06/2020   HCT 31.5 (L) 02/06/2020   MCV 88.7 02/06/2020   PLT 287 02/06/2020    Lab Results  Component Value Date   NA 127 (L) 02/06/2020   K 4.4 02/06/2020   CO2 28 02/06/2020   GLUCOSE 103 (H) 02/06/2020   BUN 52 (H) 02/06/2020   CREATININE 7.60 (H) 02/06/2020   CALCIUM 8.5 (L) 02/06/2020   GFRNONAA 6 (L) 02/06/2020   GFRAA 13 (L) 12/17/2018    Lab Results  Component Value Date   ALT 6 01/07/2020   AST 21 01/07/2020   ALKPHOS 223 (H) 01/07/2020   BILITOT 0.7 01/07/2020     I have  reviewed the micro and lab results in Epic.  Imaging CT ABDOMEN PELVIS W CONTRAST  Result Date: 02/07/2020 CLINICAL DATA:  Abdominal distension. Right flank edema. History of left hip infection. EXAM: CT ABDOMEN AND PELVIS WITH CONTRAST TECHNIQUE: Multidetector CT imaging of the abdomen and pelvis was performed using the standard protocol following bolus administration of intravenous contrast. CONTRAST:  166mL OMNIPAQUE IOHEXOL 300 MG/ML  SOLN COMPARISON:  CT dated May 07, 2005. FINDINGS: Lower chest: The lung bases are clear. The heart is enlarged. Hepatobiliary: The liver is normal. Status post cholecystectomy.There is no biliary ductal dilation. Pancreas: Normal contours without ductal dilatation. No peripancreatic fluid collection. Spleen: Unremarkable. Adrenals/Urinary Tract: --Adrenal glands: Unremarkable. --Right kidney/ureter: No hydronephrosis or radiopaque kidney stones. --Left kidney/ureter: No hydronephrosis or radiopaque kidney stones. --Urinary bladder: Unremarkable. Stomach/Bowel: --Stomach/Duodenum: No hiatal hernia or other gastric abnormality. Normal duodenal course and caliber. --Small bowel: Unremarkable. --Colon: Rectosigmoid diverticulosis without acute inflammation. --Appendix: Normal. Vascular/Lymphatic: Atherosclerotic calcification is present within the non-aneurysmal abdominal aorta, without hemodynamically significant stenosis. --No retroperitoneal lymphadenopathy. --No mesenteric lymphadenopathy. --are enlarged left pelvic and left inguinal lymph nodes, likely reactive. Reproductive: Unremarkable Other: No ascites or free air. There is extensive bilateral flank subcutaneous edema with overlying skin thickening, right worse than left. Musculoskeletal. Again noted is a intratrochanteric fracture of the proximal left femur. There is no significant interval healing. The patient is status post prior intramedullary nail placement.1 there is a rim enhancing collection with a pocket of gas  coursing from the superior aspect of the greater trochanter towards the lateral proximal thigh measuring approximately 8 cm in length. At the lesser trochanter there is a non rim enhancing 4 cm collection (axial series 3, image 98). There is surrounding soft tissue swelling. There is hypoattenuation of the gluteal musculature. There is some lucency involving the greater trochanter and adjacent fracture fragments. There is a healed fracture of the left inferior pubic ramus. IMPRESSION: 1. Nonspecific bilateral flank edema is noted, right worse than left. 2. There is a rim enhancing collection extending from the greater trochanter of the left hip towards the lateral aspect of the proximal left thigh, concerning for an abscess. 3. There is a nonenhancing 4 cm collection adjacent to the lesser trochanter of the left hip which may represent a postoperative seroma or hematoma. 4. There are areas of lucency involving the proximal left femur which may be related to the patient's prior fracture, however in the setting of an adjacent abscess, osteomyelitis is not excluded. 5. Hypoattenuating areas throughout the left gluteal musculature is concerning for myositis. 6.  Aortic Atherosclerosis (ICD10-I70.0). Electronically Signed   By: Constance Holster M.D.   On: 02/07/2020 01:40        [facility] [phone] pager 02/08/2020, 7:52 AM    I have spent a total of 25 minutes with the patient reviewing hospital notes,  test results, labs and examining the patient as well as establishing an assessment and plan.

## 2020-02-08 NOTE — Progress Notes (Signed)
[facility] NEPHROLOGY PROGRESS NOTE  Assessment/ Plan: Pt is a 54 y.o. yo female with new ESRD, left foot infection is status post a BKA, left hip fracture status post ORIF, now in CIR.  # AoCKD 5,now ESRD: Baseline Creat 4.9- 5.2, eGFR 9-11 from 8/21 - 10/21. Creat 6 on admission, then mid 7's, IVF's given w/o improvementso started HD.Has been clipped to [facility].S/p [facility] and AVF on11/8, 1st HD 11/9. Continue MWF schedule. Plan for dialysis today.  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 S/p 4U PRBCs.Tsat low 10%, got 557m Feraheme on 11/6, holding further doses due to infection. IncreasedAranesp 60 to 100 q week on 11/17.Last hemoglobin at goal.  #L foot stump chronic open wound w/ poss osteo by plain films-> s/pL BKA 01/12/20  #L hip fracture - admission diagnosis.S/p ORIF on 10/28 per ortho.S/p I&D surgical  wound 11/18. Currently on IV ertapenem until 12/30 per ID. Found to have left hip abscess status post I&D by Ortho on 12/7.  ID is following.  #HTN/ vol -lower amlodipine dose.  Also on hydralazine, coreg and norvasc.  UF during HD.  #BMM -monitor calcium and phosphorus level.  Increase sevelamer dose.  PTH 44.   #Hyponatremia, hypervolemic: Managed with dialysis,.  Minimize fluid intake.  #Nutrition: albumin very low, on protein supplement.Renal diet w/fluid restrictions.  Subjective: Seen and examined.  Doing inpatient rehab.  Status post I&D by Ortho for hip abscess.  Plan for dialysis today. Objective Vital signs in last 24 hours: Vitals:   02/07/20 0500 02/07/20 1259 02/07/20 2005 02/08/20 0539  BP: 125/68 (!) 139/59 (!) 115/50 139/66  Pulse: 60 60 (!) 58 (!) 59  Resp: '18 17 14 17  ' Temp: 98.5 F (36.9 C) 97.6 F (36.4 C) 99.3 F (37.4 C) 98.4 F (36.9 C)  TempSrc: Oral  Oral Oral  SpO2: 100% 96% 96% 99%  Weight: 88 kg   89.4 kg  Height:       Weight change: 2 kg  Intake/Output Summary (Last 24 hours) at 02/08/2020 0957 Last data  filed at 02/08/2020 0737 Gross per 24 hour  Intake 552 ml  Output --  Net 552 ml       Labs: Basic Metabolic Panel: Recent Labs  Lab 02/01/20 1331 02/03/20 1324 02/06/20 1520  NA 130* 128* 127*  K 4.1 3.9 4.4  CL 94* 90* 89*  CO2 '26 26 28  ' GLUCOSE 131* 136* 103*  BUN 46* 44* 52*  CREATININE 6.40* 6.61* 7.60*  CALCIUM 8.5* 8.6* 8.5*  PHOS 4.9* 5.2* 6.0*   Liver Function Tests: Recent Labs  Lab 02/01/20 1331 02/03/20 1324 02/06/20 1520  ALBUMIN 2.0* 2.0* 2.2*   No results for input(s): LIPASE, AMYLASE in the last 168 hours. No results for input(s): AMMONIA in the last 168 hours. CBC: Recent Labs  Lab 02/01/20 1332 02/03/20 1324 02/06/20 1520  WBC 11.1* 11.3* 12.0*  HGB 9.5* 9.6* 10.0*  HCT 30.9* 31.4* 31.5*  MCV 89.8 90.2 88.7  PLT 295 285 287   Cardiac Enzymes: No results for input(s): CKTOTAL, CKMB, CKMBINDEX, TROPONINI in the last 168 hours. CBG: Recent Labs  Lab 02/07/20 1151 02/07/20 1219 02/07/20 1637 02/07/20 2103 02/08/20 0611  GLUCAP 69* 88 127* 117* 83    Iron Studies: No results for input(s): IRON, TIBC, TRANSFERRIN, FERRITIN in the last 72 hours. Studies/Results: CT ABDOMEN PELVIS W CONTRAST  Result Date: 02/07/2020 CLINICAL DATA:  Abdominal distension. Right flank edema. History of left hip infection. EXAM: CT ABDOMEN AND PELVIS  WITH CONTRAST TECHNIQUE: Multidetector CT imaging of the abdomen and pelvis was performed using the standard protocol following bolus administration of intravenous contrast. CONTRAST:  19m OMNIPAQUE IOHEXOL 300 MG/ML  SOLN COMPARISON:  CT dated May 07, 2005. FINDINGS: Lower chest: The lung bases are clear. The heart is enlarged. Hepatobiliary: The liver is normal. Status post cholecystectomy.There is no biliary ductal dilation. Pancreas: Normal contours without ductal dilatation. No peripancreatic fluid collection. Spleen: Unremarkable. Adrenals/Urinary Tract: --Adrenal glands: Unremarkable. --Right kidney/ureter:  No hydronephrosis or radiopaque kidney stones. --Left kidney/ureter: No hydronephrosis or radiopaque kidney stones. --Urinary bladder: Unremarkable. Stomach/Bowel: --Stomach/Duodenum: No hiatal hernia or other gastric abnormality. Normal duodenal course and caliber. --Small bowel: Unremarkable. --Colon: Rectosigmoid diverticulosis without acute inflammation. --Appendix: Normal. Vascular/Lymphatic: Atherosclerotic calcification is present within the non-aneurysmal abdominal aorta, without hemodynamically significant stenosis. --No retroperitoneal lymphadenopathy. --No mesenteric lymphadenopathy. --are enlarged left pelvic and left inguinal lymph nodes, likely reactive. Reproductive: Unremarkable Other: No ascites or free air. There is extensive bilateral flank subcutaneous edema with overlying skin thickening, right worse than left. Musculoskeletal. Again noted is a intratrochanteric fracture of the proximal left femur. There is no significant interval healing. The patient is status post prior intramedullary nail placement.1 there is a rim enhancing collection with a pocket of gas coursing from the superior aspect of the greater trochanter towards the lateral proximal thigh measuring approximately 8 cm in length. At the lesser trochanter there is a non rim enhancing 4 cm collection (axial series 3, image 98). There is surrounding soft tissue swelling. There is hypoattenuation of the gluteal musculature. There is some lucency involving the greater trochanter and adjacent fracture fragments. There is a healed fracture of the left inferior pubic ramus. IMPRESSION: 1. Nonspecific bilateral flank edema is noted, right worse than left. 2. There is a rim enhancing collection extending from the greater trochanter of the left hip towards the lateral aspect of the proximal left thigh, concerning for an abscess. 3. There is a nonenhancing 4 cm collection adjacent to the lesser trochanter of the left hip which may represent a  postoperative seroma or hematoma. 4. There are areas of lucency involving the proximal left femur which may be related to the patient's prior fracture, however in the setting of an adjacent abscess, osteomyelitis is not excluded. 5. Hypoattenuating areas throughout the left gluteal musculature is concerning for myositis. 6.  Aortic Atherosclerosis (ICD10-I70.0). Electronically Signed   By: CConstance HolsterM.D.   On: 02/07/2020 01:40    Medications: Infusions: . sodium chloride 250 mL (02/05/20 1749)  . ertapenem 500 mg (02/07/20 1724)    Scheduled Medications: . (feeding supplement) PROSource Plus  30 mL Oral BID BM  . alosetron  0.5 mg Oral BID  . amitriptyline  10 mg Oral QHS  . amLODipine  10 mg Oral Daily  . atorvastatin  40 mg Oral q1800  . carvedilol  12.5 mg Oral BID WC  . Chlorhexidine Gluconate Cloth  6 each Topical Q0600  . citalopram  20 mg Oral Daily  . darbepoetin (ARANESP) injection - DIALYSIS  100 mcg Intravenous Q Mon-HD  . DULoxetine  30 mg Oral Daily  . enoxaparin (LOVENOX) injection  30 mg Subcutaneous Q24H  . hydrALAZINE  100 mg Oral TID  . insulin aspart  0-5 Units Subcutaneous QHS  . insulin aspart  0-9 Units Subcutaneous TID WC  . insulin glargine  15 Units Subcutaneous QHS  . melatonin  1.5 mg Oral QHS  . morphine  15 mg Oral Q12H  .  multivitamin  1 tablet Oral QHS  . pantoprazole  40 mg Oral Daily  . senna-docusate  1 tablet Oral BID  . sevelamer carbonate  800 mg Oral TID WC    have reviewed scheduled and prn medications.  Physical Exam: General:NAD, comfortable Heart:RRR, s1s2 nl Lungs: Clear b/l, no crackle Abdomen:soft, Non-tender, non-distended Extremities:No edema, left BKA Dialysis Access: Right IJ TDC, maturing left AV fistula.  Landry Lookingbill Prasad Aubrii Sharpless 02/08/2020,9:57 AM  LOS: 16 days  Pager: [phone]

## 2020-02-09 ENCOUNTER — Inpatient Hospital Stay (HOSPITAL_COMMUNITY): Payer: Medicaid Other

## 2020-02-09 ENCOUNTER — Inpatient Hospital Stay (HOSPITAL_COMMUNITY): Payer: Medicaid Other | Admitting: Occupational Therapy

## 2020-02-09 DIAGNOSIS — S88112A Complete traumatic amputation at level between knee and ankle, left lower leg, initial encounter: Secondary | ICD-10-CM | POA: Diagnosis not present

## 2020-02-09 LAB — RENAL FUNCTION PANEL
Albumin: 2.2 g/dL — ABNORMAL LOW (ref 3.5–5.0)
Anion gap: 10 (ref 5–15)
BUN: 18 mg/dL (ref 6–20)
CO2: 28 mmol/L (ref 22–32)
Calcium: 8.4 mg/dL — ABNORMAL LOW (ref 8.9–10.3)
Chloride: 92 mmol/L — ABNORMAL LOW (ref 98–111)
Creatinine, Ser: 4.61 mg/dL — ABNORMAL HIGH (ref 0.44–1.00)
GFR, Estimated: 11 mL/min — ABNORMAL LOW (ref >60)
Glucose, Bld: 108 mg/dL — ABNORMAL HIGH (ref 70–99)
Phosphorus: 3.7 mg/dL (ref 2.5–4.6)
Potassium: 3.5 mmol/L (ref 3.5–5.1)
Sodium: 130 mmol/L — ABNORMAL LOW (ref 135–145)

## 2020-02-09 LAB — GLUCOSE, CAPILLARY
Glucose-Capillary: 107 mg/dL — ABNORMAL HIGH (ref 70–99)
Glucose-Capillary: 146 mg/dL — ABNORMAL HIGH (ref 70–99)
Glucose-Capillary: 102 mg/dL — ABNORMAL HIGH (ref 70–99)
Glucose-Capillary: 186 mg/dL — ABNORMAL HIGH (ref 70–99)

## 2020-02-09 MED ORDER — CYCLOBENZAPRINE HCL 5 MG PO TABS
5.0000 mg | ORAL_TABLET | Freq: Three times a day (TID) | ORAL | Status: DC | PRN
  Administered 2020-02-13 – 2020-02-20 (×6): 5 mg via ORAL
  Filled 2020-02-09 (×6): qty 1

## 2020-02-09 MED ORDER — CHLORHEXIDINE GLUCONATE CLOTH 2 % EX PADS: 6.0000 | MEDICATED_PAD | Freq: Every day | CUTANEOUS | Status: DC

## 2020-02-09 NOTE — Progress Notes (Signed)
PHYSICAL MEDICINE & REHABILITATION PROGRESS NOTE   Subjective/Complaints:   Pt asking what the plan is- explained ID wants her to go to IR and get abscesses drained- usually 1x- with a needle.   Pt says pain is a little more lately- could be due increased swelling.  LBM 2 days ago- feels like needs to go.    ROS:   Pt denies SOB, abd pain, CP, N/V/C/D, and vision changes   Objective:   No results found. Recent Labs    02/06/20 1520 02/08/20 1312  WBC 12.0* 12.1*  HGB 10.0* 9.6*  HCT 31.5* 31.4*  PLT 287 212   Recent Labs    02/08/20 1312 02/09/20 0731  NA 124* 130*  K 4.3 3.5  CL 90* 92*  CO2 24 28  GLUCOSE 85 108*  BUN 42* 18  CREATININE 6.47* 4.61*  CALCIUM 8.4* 8.4*    Intake/Output Summary (Last 24 hours) at 02/09/2020 0848 Last data filed at 02/09/2020 0700 Gross per 24 hour  Intake 526 ml  Output 3500 ml  Net -2974 ml     Pressure Injury 01/23/20 Buttocks Left Deep Tissue Pressure Injury - Purple or maroon localized area of discolored intact skin or blood-filled blister due to damage of underlying soft tissue from pressure and/or shear. Circular, dark, red area, surrounde (Active)  01/23/20 1609  Location: Buttocks  Location Orientation: Left  Staging: Deep Tissue Pressure Injury - Purple or maroon localized area of discolored intact skin or blood-filled blister due to damage of underlying soft tissue from pressure and/or shear.  Wound Description (Comments): Circular, dark, red area, surrounded by MASD  Present on Admission: Yes    Physical Exam: Vital Signs Blood pressure (!) 133/50, pulse 64, temperature 98.6 F (37 C), temperature source Oral, resp. rate 17, height 5\' 10"  (1.778 m), weight 89.5 kg, SpO2 91 %. Constitutional: sitting up in bed- appropriate, but flat affect, NAD HEENT: EOMI, oral membranes moist Neck: supple Cardiovascular: RRR Respiratory/Chest: CTA B/L- no W/R/R- good air movement GI/Abdomen: Soft, NT, ND, (+)BS    Ext: no clubbing, cyanosis, or edema Psych: depressed/flat affect Skin: Warm and dry.  L BKA-  Well shaped with shrinker.  L hip looks great- sutures out-steristrips-  L thigh is VERY swollen and L hip incision is draining slightly- through dressing- serosanguinous drainage.  Musc: Left BKA with mild edema and tenderness Right lower extremity edema, stable Neuro: Alert Motor: Right lower extremity: Hip flexion, knee extension 4-5/5 (pain inhibition), ankle dorsiflexion 4+/5, stable exam Left lower extremity: Hip flexion, knee extension 3/5 (some pain inhibition), ankle dorsiflexion 4/5   Assessment/Plan: 1. Functional deficits which require 3+ hours per day of interdisciplinary therapy in a comprehensive inpatient rehab setting.  Physiatrist is providing close team supervision and 24 hour management of active medical problems listed below.  Physiatrist and rehab team continue to assess barriers to discharge/monitor patient progress toward functional and medical goals  Care Tool:  Bathing    Body parts bathed by patient: Right arm,Left arm,Chest,Abdomen,Face,Right upper leg,Left upper leg,Front perineal area   Body parts bathed by helper: Buttocks,Right lower leg Body parts n/a: Left lower leg   Bathing assist Assist Level: Minimal Assistance - Patient > 75% (lateral leans at EOB and washed buttocks on BSC)     Upper Body Dressing/Undressing Upper body dressing   What is the patient wearing?: Pull over shirt    Upper body assist Assist Level: Set up assist    Lower Body Dressing/Undressing Lower body  dressing      What is the patient wearing?: Pants,Underwear/pull up     Lower body assist Assist for lower body dressing: Maximal Assistance - Patient 25 - 49%     Toileting Toileting Toileting Activity did not occur (Clothing management and hygiene only): N/A (no void or bm)  Toileting assist Assist for toileting: Maximal Assistance - Patient 25 - 49% Assistive Device  Comment: female urinal   Transfers Chair/bed transfer  Transfers assist  Chair/bed transfer activity did not occur: Safety/medical concerns  Chair/bed transfer assist level: Minimal Assistance - Patient > 75% Chair/bed transfer assistive device: Sliding board   Locomotion Ambulation   Ambulation assist   Ambulation activity did not occur: Safety/medical concerns          Walk 10 feet activity   Assist  Walk 10 feet activity did not occur: Safety/medical concerns        Walk 50 feet activity   Assist Walk 50 feet with 2 turns activity did not occur: Safety/medical concerns         Walk 150 feet activity   Assist Walk 150 feet activity did not occur: Safety/medical concerns         Walk 10 feet on uneven surface  activity   Assist Walk 10 feet on uneven surfaces activity did not occur: Safety/medical concerns         Wheelchair     Assist Will patient use wheelchair at discharge?: Yes Type of Wheelchair: Manual    Wheelchair assist level: Supervision/Verbal cueing Max wheelchair distance: 200    Wheelchair 50 feet with 2 turns activity    Assist        Assist Level: Supervision/Verbal cueing   Wheelchair 150 feet activity     Assist      Assist Level: Supervision/Verbal cueing   Medical Problem List and Plan: 1.  Impaired mobility and ADLs secondary to L BKA  Continue CIR 2.  Antithrombotics: -DVT/anticoagulation:  Pharmaceutical: Lovenox             -antiplatelet therapy: N/A 3. Chronic pain/Pain Management: Was on oxycodone 5 mg tid prn as well Cymbalta for neuropathy (at baseline)- now on 10-15 mg q4 hours prn  12/2- will start MS Contin 15 mg BID  12/3-5- MUCH improved pain for pt- con't MS Contin  12/9- pain a little worse today- change flexeril to TID prn 4. Mood: Team to provide ego support/encouragement.              -antipsychotic agents: N/A 5. Neuropsych: This patient is capable of making decisions on  her own behalf. 6. Skin/Wound Care: Monitor wound daily   Continue stump shrinker   11/30- will call Vascular on Thursday about getting sutures out of L BKA- has been 4 weeks.   12/3- L/M- with vascular  12/5 vascular hasn't seen pt yet--primary team will need to reach out again tomorrow  12/6- will check with vascular if can get staples out-  7. Fluids/Electrolytes/Nutrition: Strict I/O. 1200 cc FR. Will consult dietician to educate on renal diet.  8. Left hip ORIF/hip abscess s/p I&D 11/18: NWB LLE.   Flagyl changed to Ertepenem due to patient refusal, continue  12/1- on Ertepenem until end of December.  12/7- pt has new rim enhancing lesions in L hip, thigh/glute, etc- have called Ortho and also calling ID to discuss- might have been since switched off Flagyl?   12/9- ID suggested going to IR to get abscesses drained- WBC is still 12.1k-  con't Ertepenem 9. 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 T1DM with neuropathy: Hgb A1c-6.2. Was on Lantus 50 units in am with 10 units novolog tid.     Lantus 38 twice daily, decreased to 10 on 11/27, increased to 15 on 11/28  NovoLog 5 units with breakfast, 8 units with lunch and dinner, decreased to 5 3 times daily on 11/27, DC'd on 11/27.  12/8- Had hypoglycemia x1- of 69- con't regimen for now  12/9- BGs 91-161- con't regimen 11. Now ESRD: Now on HD MWF (set up at [place] post discharge)  12. Anemia of chronic disease/iron deficiency: Hgb-6.3 at admission--->9.1 s/p 4 units PRBC. Feraheme X 1 (additional dose held due to infection) and Aranesp weekly.   Hemoglobin 9.7 on 11/26  Continue to monitor 13. HTN: Monitor BP tid--continue Coreg bid, Norvasc and apresoline tid.   Decreased Coreg to 12.5mg  BID.   12/5 BP controlled 14. IBS-D:   MiraLAX DC'd  Resumed Lotrenex    Colace as needed ordered  12/2- will make colace scheduled and add sorbitol if no BM by 3pm  12/4 changed colace to senna-s  given addn of MS Contin  12/6- refusing laxatives- but had BM Saturday and this AM, so con't regimen  12/9- LBM 2 days ago- feels like can go today- will wait on sorbitol until tomorrow 15. Malnutrition: Continue protein supplement.  16. Abdominal pain: Due to diarrhea? PPI resumed-->patient to have family bring in [place] from home.  17.  Sleep disturbance:   Low dose elavil at bedtime.  Melatonin added on 11/26  Improving overall 18. Leukocytosis:   WBCs 11.6 on 11/26, labs with HD  See #8, #9  Afebrile  12/3- WBC 11.1- afebrile; no Sx's- con't to monitor  12/6- WBC 11.3- cause??  12/7- WBC 12.0- due to abscesses in L thigh, glute, etc causing edema- have contacted Ortho AND ID- waiting to hear back.   12/9- ID is calling IR to get abscesses drained.  19. LE/thigh edema  12/6- will check CT of abd/pelvis with contrast to determine cause-got OK by nephrology to use contrast- has 2-3+ edema of thighs L>R  12/7- due to rim enhancing lesions- as above.   12/8- will con't Ertepenem- per ID.  20. Depression-new-  12/8- will add Celexa 20 mg daily- max DOSE  12/9- had to decrease Duloxetine to 30 mg daily.    LOS: 17 days A FACE TO FACE EVALUATION WAS PERFORMED  Matisha Termine 02/09/2020, 8:48 AM

## 2020-02-09 NOTE — Progress Notes (Signed)
Occupational Therapy Weekly Progress Note  Patient Details  Name: [name] MRN: [id number] Date of Birth: 27-Aug-1965  Beginning of progress report period: February 01, 2020 End of progress report period: February 09, 2020  Today's Date: 02/09/2020 OT Individual Time: 3846-6599 and 1335-1430 OT Individual Time Calculation (min): 61 min and 55 min   Patient has met 2 of 3 short term goals.  Pt is overall Min A with LB ADLs for bathing and dressing at EOB level with lateral leans and reacher when needed, continue to have difficulty fully and thoroughly reaching buttocks for hygiene and clothing management. Pt performing slide board transfers to/from [facility] and wheelchair with Min A and sometimes CGA/close supervision but has difficulty with slight inclines that simulate her home set up. Max A to Max A of 2 for sit to stands, making progressing to performing LB ADL at sit to stand level limited. Continue plan of care.   Patient continues to demonstrate the following deficits: muscle weakness, decreased cardiorespiratoy endurance and decreased sitting balance, decreased standing balance, decreased postural control and decreased balance strategies and therefore will continue to benefit from skilled OT intervention to enhance overall performance with BADL and Reduce care partner burden.  Patient progressing toward long term goals..  Continue plan of care.  OT Short Term Goals Week 2:  OT Short Term Goal 1 (Week 2): Pt will perform BSC/toilet transfer with Mod of 1 OT Short Term Goal 1 - Progress (Week 2): Met OT Short Term Goal 2 (Week 2): Pt will perform LB dress EOB level with Mod A and lateral leans with AE PRN OT Short Term Goal 2 - Progress (Week 2): Met OT Short Term Goal 3 (Week 2): Pt will perform sit to stand Mod A in prep for LB ADL OT Short Term Goal 3 - Progress (Week 2): Progressing toward goal Week 3:  OT Short Term Goal 1 (Week 3): Pt will perform sit to stand Mod A in prep for LB  ADL OT Short Term Goal 2 (Week 3): Pt will perform clothing management with lateral leans on BSC with no more than Mod A OT Short Term Goal 3 (Week 3): Pt will perform BSC transfers with supervision OT Short Term Goal 4 (Week 3): Pt will perform 10 minute ADL/activity of choice without fatigue or rest break  Skilled Therapeutic Interventions/Progress Updates:    Session 1: Pt greeted at time of session supine in bed resting agreeable to OT session and morning ADL, pain toward end of session with rest break provided and pt premedicated prior to session. Supine to sit from flat surface with Min A, encouraged log rolling but states it is painful on her hip. Slide board transfser bed > w/c CGA/close supervision, self propel to sink and performed UB bathe/dress with Set up/supervision. LB bathing with LHS for feet, declined washing buttocks and periarea today saying she did with nursing staff earlier but is normally Min A with lateral leans EOB to fully wash. Attempted sit to stands at sink level x2 but Max A both trials and unable to fully come up into standing d/t "spasms" in L hip requiring her to return to sitting. Slide board back to bed Min A on an incline, lateral leans to doff/don new pants. Declined changing brief as well and able to don new pants today without reacher, able to reach R foot an residual limb to thread. Pt stating she was told by an MD not to wrap with ace wrap, no left  off today. Alarm on, call bell in reach.  Session 2: Pt greeted at time of session supine in bed resting with RT, agreeable to OT session and c/o 7/10 pain, rest breaks PRN and RN aware, provided med pass later at end of session. Slide board bed > w/c CGA/close supervision and self propelled to gym, performed SCIFIT 10 minutes total on level 3 switching directions half way through with brief rest break. Slide board w/c > mat on slight incline with more Min/Mod today and performed rebounder activity with green weighted 2kg  ball attempting torso twists at first but too painful and performed a throw followed by overhead press instead, 2x10 dropping overhead press half way through d/t fatigue. PA present during session and made pt aware of upcoming drainage planned for tomorrow w/ abscess. Slide board mat > w/c > bed with Min/CGA overall. Sit to supine supervision alarm on call bell in reach.   Therapy Documentation Precautions:  Precautions Precautions: Fall Precaution Comments: new L BKA Required Braces or Orthoses: Other Brace Other Brace: L LE limb guard Restrictions Weight Bearing Restrictions: Yes LLE Weight Bearing: Non weight bearing    Therapy/Group: Individual Therapy  [name] 02/09/2020, 12:28 PM

## 2020-02-09 NOTE — Consult Note (Signed)
Chief Complaint: Patient was seen in consultation today for a left hip fluid collection aspiration.   Referring Physician(s): Dr. Juleen China  Supervising Physician: Arne Cleveland  Patient Status: [facility] - In-pt  History of Present Illness: [name] is a 54 y.o. female with a medical history significant for anxiety, depression, DM, HTN, ESRD on HD, and stroke. She had a recent hip fracture and is status post intramedullary nailing 12/29/19. This was further complicated by a wound infection and she had an I&D 01/19/20. Cultures were positive for bacterioides and enterobacter cloacae. CT imaging identified a hip abscess.   CT abdomen/Pelvis  IMPRESSION: 1. Nonspecific bilateral flank edema is noted, right worse than left. 2. There is a rim enhancing collection extending from the greater trochanter of the left hip towards the lateral aspect of the proximal left thigh, concerning for an abscess. 3. There is a nonenhancing 4 cm collection adjacent to the lesser trochanter of the left hip which may represent a postoperative seroma or hematoma. 4. There are areas of lucency involving the proximal left femur which may be related to the patient's prior fracture, however in the setting of an adjacent abscess, osteomyelitis is not excluded. 5. Hypoattenuating areas throughout the left gluteal musculature is concerning for myositis. 6.  Aortic Atherosclerosis (ICD10-I70.0).   Interventional Radiology has been asked to evaluate this patient for an image-guided left hip fluid collection aspiration. This case has been reviewed and procedure approved by Dr. Vernard Gambles.   Past Medical History:  Diagnosis Date  . Anxiety 2002  . Chest tightness   . Depression 2001  . Diabetes type 1, uncontrolled ([place])    at age 64  . Diabetic neuropathy ([place])   . Essential hypertension 2015  . GERD (gastroesophageal reflux disease)    about age of 80  . Nausea and vomiting in adult   . Stroke ([place])    . Urinary frequency   . Yeast vaginitis     Past Surgical History:  Procedure Laterality Date  . ACHILLES TENDON SURGERY Left 03/10/2013   Procedure: LEFT CHOPART AMPUTATION/ LEFT TENDON ACHILLES [place];  Surgeon: Wylene Simmer, MD;  Location: [place];  Service: Orthopedics;  Laterality: Left;  . AMPUTATION Left 06/15/2012   Procedure: AMPUTATION DIGIT;  Surgeon: Meredith Pel, MD;  Location: [place];  Service: Orthopedics;  Laterality: Left;  Left great toe revision amputation  . AMPUTATION Left 01/12/2020   Procedure: AMPUTATION BELOW KNEE;  Surgeon: Wylene Simmer, MD;  Location: [place];  Service: Orthopedics;  Laterality: Left;  . AV FISTULA PLACEMENT Left 01/09/2020   Procedure: LEFT UPPER ARM ARTERIOVENOUS (AV) FISTULA CREATION;  Surgeon: Cherre Robins, MD;  Location: [place];  Service: Vascular;  Laterality: Left;  . ENDOMETRIAL ABLATION    . I & D EXTREMITY Left 01/19/2020   Procedure: IRRIGATION AND DEBRIDEMENT Left Hip;  Surgeon: Rod Can, MD;  Location: [place];  Service: Orthopedics;  Laterality: Left;  . INSERTION OF DIALYSIS CATHETER Right 01/09/2020   Procedure: INSERTION OF DIALYSIS CATHETER;  Surgeon: Cherre Robins, MD;  Location: [place];  Service: Vascular;  Laterality: Right;  . INTRAMEDULLARY (IM) NAIL INTERTROCHANTERIC Left 12/29/2019   Procedure: INTRAMEDULLARY (IM) NAIL INTERTROCHANTRIC;  Surgeon: Rod Can, MD;  Location: [facility];  Service: Orthopedics;  Laterality: Left;  . IR ANGIOGRAM EXTREMITY LEFT  12/17/2018  . IR FEM POP ART ATHERECT INC PTA MOD SED  12/17/2018  . IR RADIOLOGIST EVAL & MGMT  11/30/2018  . IR RADIOLOGIST EVAL &  MGMT  12/09/2018  . IR RADIOLOGIST EVAL & MGMT  02/08/2019  . IR US GUIDE VASC ACCESS RIGHT  12/17/2018  . LAPAROSCOPIC CHOLECYSTECTOMY  01/30/2015   [facility] day surgery     Allergies: Trazodone, Latex, and Lidocaine  Medications: Prior to Admission medications   Medication Sig Start Date End Date Taking? Authorizing  Provider  alosetron (LOTRONEX) 1 MG tablet Take 1 mg by mouth 2 (two) times daily. 12/19/19   [provider]  amLODipine (NORVASC) 10 MG tablet Take 1 tablet (10 mg total) by mouth daily. 01/23/20   Debbe Odea, MD  aspirin (ASPIRIN CHILDRENS) 81 MG chewable tablet Chew 1 tablet (81 mg total) by mouth 2 (two) times daily with a meal. 01/02/20 02/16/20  Cherlynn June B, PA  atorvastatin (LIPITOR) 40 MG tablet TAKE 1 TABLET (40 MG TOTAL) BY MOUTH DAILY AT 6 PM. 02/22/19   Fulp, Cammie, MD  carvedilol (COREG) 25 MG tablet Take 1 tablet (25 mg total) by mouth 2 (two) times daily with a meal. 01/23/20   Debbe Odea, MD  ceFEPIme 1 g in sodium chloride 0.9 % 100 mL Inject 1 g into the vein daily. 01/23/20   Debbe Odea, MD  cyclobenzaprine (FLEXERIL) 10 MG tablet Take 1 tablet (10 mg total) by mouth 2 (two) times daily as needed for muscle spasms. 01/23/20   Debbe Odea, MD  DEXILANT 60 MG capsule Take 60 mg by mouth daily. 06/09/17   [provider]  DULoxetine (CYMBALTA) 60 MG capsule TAKE 1 CAPSULE BY MOUTH EVERY DAY. Please make PCP appointment. Patient taking differently: Take 60 mg by mouth daily.  03/23/19   Fulp, Cammie, MD  enoxaparin (LOVENOX) 30 MG/0.3ML injection Inject 0.3 mLs (30 mg total) into the skin daily. 01/24/20   Debbe Odea, MD  hydrALAZINE (APRESOLINE) 100 MG tablet Take 1 tablet (100 mg total) by mouth 3 (three) times daily. 01/23/20   Debbe Odea, MD  insulin glargine (LANTUS) 100 UNIT/ML injection Inject 0.38 mLs (38 Units total) into the skin at bedtime. 01/23/20   Debbe Odea, MD  Menthol-Methyl Salicylate (MUSCLE RUB) 10-15 % CREA Apply 1 application topically as needed for muscle pain. 02/02/20   Love, Ivan Anchors, PA-C  Nutritional Supplements (,FEEDING SUPPLEMENT, PROSOURCE PLUS) liquid Take 30 mLs by mouth 2 (two) times daily between meals. 01/23/20   Debbe Odea, MD  oxyCODONE (OXY IR/ROXICODONE) 5 MG immediate release tablet Take 1-2 tablets  (5-10 mg total) by mouth every 4 (four) hours as needed for moderate pain or severe pain. 01/23/20   Debbe Odea, MD  sevelamer carbonate (RENVELA) 800 MG tablet Take 1 tablet (800 mg total) by mouth 3 (three) times daily with meals. 01/23/20   Debbe Odea, MD  sodium chloride (OCEAN) 0.65 % SOLN nasal spray Place 1 spray into both nostrils as needed for congestion (nose irritation). 02/02/20   Bary Leriche, PA-C     Family History  Problem Relation Age of Onset  . Diabetes Mother   . Heart attack Mother   . Heart disease Mother        before age 56  . Hypertension Mother   . Hyperlipidemia Mother   . Diabetes Father   . Heart attack Father   . Heart disease Father        before age 52  . Diabetes Sister   . Hypertension Sister     Social History   Socioeconomic History  . Marital status: Divorced    Spouse name: Not  on file  . Number of children: 1  . Years of education: Not on file  . Highest education level: Not on file  Occupational History    Employer: [employer]  Tobacco Use  . Smoking status: Light Tobacco Smoker    Packs/day: 0.25    Years: 22.00    Pack years: 5.50    Types: Cigarettes  . Smokeless tobacco: Never Used  Vaping Use  . Vaping Use: Never used  Substance and Sexual Activity  . Alcohol use: Yes    Alcohol/week: 0.0 standard drinks    Comment: sociial  . Drug use: No  . Sexual activity: Yes    Partners: Male  Other Topics Concern  . Not on file  Social History Narrative   Lives at home with her daughter   Right handed   Caffeine: 3 cups daily   Social Determinants of Health   Financial Resource Strain: Not on file  Food Insecurity: Not on file  Transportation Needs: Not on file  Physical Activity: Not on file  Stress: Not on file  Social Connections: Not on file    Review of Systems: A 12 point ROS discussed and pertinent positives are indicated in the HPI above.  All other systems are negative.  Review of Systems  Constitutional:  Positive for fatigue. Negative for appetite change.  Respiratory: Negative for cough and shortness of breath.   Cardiovascular: Negative for chest pain and leg swelling.  Gastrointestinal: Negative for abdominal pain, diarrhea, nausea and vomiting.  Musculoskeletal: Positive for arthralgias.  Neurological: Positive for dizziness. Negative for headaches.  Hematological: Does not bruise/bleed easily.    Vital Signs: BP (!) 133/50 (BP Location: Right Arm)   Pulse 64   Temp 98.6 F (37 C) (Oral)   Resp 17   Ht 5\' 10"  (1.778 m)   Wt 197 lb 5 oz (89.5 kg)   SpO2 91%   BMI 28.31 kg/m   Physical Exam Constitutional:      General: She is not in acute distress. HENT:     Mouth/Throat:     Mouth: Mucous membranes are moist.     Pharynx: Oropharynx is clear.  Cardiovascular:     Rate and Rhythm: Normal rate and regular rhythm.     Pulses: Normal pulses.     Heart sounds: Normal heart sounds.     Comments: Right IJ tunneled dialysis catheter Pulmonary:     Effort: Pulmonary effort is normal.     Breath sounds: Normal breath sounds.  Abdominal:     General: Bowel sounds are normal.     Palpations: Abdomen is soft.  Musculoskeletal:     Comments: Left below the knee amputation; left hip pain  Skin:    General: Skin is warm and dry.  Neurological:     Mental Status: She is alert and oriented to person, place, and time.  Psychiatric:        Mood and Affect: Mood normal.        Behavior: Behavior normal.        Thought Content: Thought content normal.        Judgment: Judgment normal.     Imaging: CT ABDOMEN PELVIS W CONTRAST  Result Date: 02/07/2020 CLINICAL DATA:  Abdominal distension. Right flank edema. History of left hip infection. EXAM: CT ABDOMEN AND PELVIS WITH CONTRAST TECHNIQUE: Multidetector CT imaging of the abdomen and pelvis was performed using the standard protocol following bolus administration of intravenous contrast. CONTRAST:  165mL OMNIPAQUE IOHEXOL 300 MG/ML  SOLN COMPARISON:  CT dated May 07, 2005. FINDINGS: Lower chest: The lung bases are clear. The heart is enlarged. Hepatobiliary: The liver is normal. Status post cholecystectomy.There is no biliary ductal dilation. Pancreas: Normal contours without ductal dilatation. No peripancreatic fluid collection. Spleen: Unremarkable. Adrenals/Urinary Tract: --Adrenal glands: Unremarkable. --Right kidney/ureter: No hydronephrosis or radiopaque kidney stones. --Left kidney/ureter: No hydronephrosis or radiopaque kidney stones. --Urinary bladder: Unremarkable. Stomach/Bowel: --Stomach/Duodenum: No hiatal hernia or other gastric abnormality. Normal duodenal course and caliber. --Small bowel: Unremarkable. --Colon: Rectosigmoid diverticulosis without acute inflammation. --Appendix: Normal. Vascular/Lymphatic: Atherosclerotic calcification is present within the non-aneurysmal abdominal aorta, without hemodynamically significant stenosis. --No retroperitoneal lymphadenopathy. --No mesenteric lymphadenopathy. --are enlarged left pelvic and left inguinal lymph nodes, likely reactive. Reproductive: Unremarkable Other: No ascites or free air. There is extensive bilateral flank subcutaneous edema with overlying skin thickening, right worse than left. Musculoskeletal. Again noted is a intratrochanteric fracture of the proximal left femur. There is no significant interval healing. The patient is status post prior intramedullary nail placement.1 there is a rim enhancing collection with a pocket of gas coursing from the superior aspect of the greater trochanter towards the lateral proximal thigh measuring approximately 8 cm in length. At the lesser trochanter there is a non rim enhancing 4 cm collection (axial series 3, image 98). There is surrounding soft tissue swelling. There is hypoattenuation of the gluteal musculature. There is some lucency involving the greater trochanter and adjacent fracture fragments. There is a healed fracture of  the left inferior pubic ramus. IMPRESSION: 1. Nonspecific bilateral flank edema is noted, right worse than left. 2. There is a rim enhancing collection extending from the greater trochanter of the left hip towards the lateral aspect of the proximal left thigh, concerning for an abscess. 3. There is a nonenhancing 4 cm collection adjacent to the lesser trochanter of the left hip which may represent a postoperative seroma or hematoma. 4. There are areas of lucency involving the proximal left femur which may be related to the patient's prior fracture, however in the setting of an adjacent abscess, osteomyelitis is not excluded. 5. Hypoattenuating areas throughout the left gluteal musculature is concerning for myositis. 6.  Aortic Atherosclerosis (ICD10-I70.0). Electronically Signed   By: Constance Holster M.D.   On: 02/07/2020 01:40   DG HIP UNILAT WITH PELVIS 2-3 VIEWS LEFT  Result Date: 01/19/2020 CLINICAL DATA:  Wound drainage following surgery for a femur fracture on 12/29/2019. Status post left below-the-knee amputation on 01/12/2020. EXAM: DG HIP (WITH OR WITHOUT PELVIS) 2-3V LEFT COMPARISON:  12/29/2019. FINDINGS: Intramedullary rod and compression screw fixation of the previously demonstrated comminuted left intertrochanteric fracture. There is soft tissue gas at the fracture site and in the medial soft tissues of the thigh. No bone destruction or periosteal reaction seen. No additional fractures and no dislocation. Small calcified uterine fibroid. IMPRESSION: Soft tissue gas in the medial soft tissues of the thigh and at the left intertrochanteric fracture site, suspicious for infection with a gas-forming organism. Electronically Signed   By: Claudie Revering M.D.   On: 01/19/2020 09:38    Labs:  CBC: Recent Labs    02/01/20 1332 02/03/20 1324 02/06/20 1520 02/08/20 1312  WBC 11.1* 11.3* 12.0* 12.1*  HGB 9.5* 9.6* 10.0* 9.6*  HCT 30.9* 31.4* 31.5* 31.4*  PLT 295 285 287 212     COAGS: Recent Labs    12/28/19 1200  INR 1.1    BMP: Recent Labs    02/03/20 1324 02/06/20 1520 02/08/20 1312 02/09/20  0731  NA 128* 127* 124* 130*  K 3.9 4.4 4.3 3.5  CL 90* 89* 90* 92*  CO2 26 28 24 28   GLUCOSE 136* 103* 85 108*  BUN 44* 52* 42* 18  CALCIUM 8.6* 8.5* 8.4* 8.4*  CREATININE 6.61* 7.60* 6.47* 4.61*  GFRNONAA 7* 6* 7* 11*    LIVER FUNCTION TESTS: Recent Labs    12/30/19 0415 01/05/20 0523 01/06/20 0522 01/07/20 0107 01/08/20 1237 02/03/20 1324 02/06/20 1520 02/08/20 1312 02/09/20 0731  BILITOT 0.8 1.0 0.9 0.7  --   --   --   --   --   AST 16 22 31 21   --   --   --   --   --   ALT 15 12 12 6   --   --   --   --   --   ALKPHOS 168* 216* 223* 223*  --   --   --   --   --   PROT 7.1 6.2* 5.8* 5.8*  --   --   --   --   --   ALBUMIN 2.4* 2.1* 1.9* 1.7*   < > 2.0* 2.2* 2.2* 2.2*   < > = values in this interval not displayed.    TUMOR MARKERS: No results for input(s): AFPTM, CEA, CA199, CHROMGRNA in the last 8760 hours.  Assessment and Plan:  Left hip fluid collection: [name], 54 year old female, is tentatively scheduled to be seen 02/10/20 at the [facility] department for an image-guided left hip fluid collection aspiration with moderate sedation.  Risks and benefits discussed with the patient including bleeding, infection, damage to adjacent structures and sepsis.  All of the patient's questions were answered, patient is agreeable to proceed.  The patient will be NPO after midnight.  Consent signed and in the Interventional Radiology control room.   Thank you for this interesting consult.  I greatly enjoyed meeting [name] and look forward to participating in their care.  A copy of this report was sent to the requesting provider on this date.  Electronically Signed: Soyla Dryer, AGACNP-BC [phone] 02/09/2020, 10:59 AM   I spent a total of 20 Minutes    in face to face in clinical  consultation, greater than 50% of which was counseling/coordinating care for left hip fluid collection aspiration.

## 2020-02-09 NOTE — Progress Notes (Signed)
Physical Therapy Session Note  Patient Details  Name: [name] MRN: [id number] Date of Birth: 1965-10-11  Today's Date: 02/09/2020 PT Individual Time: 1515-1600  PT Individual Time: 11:02 - 11:55 PT Charges:  PT Individual Time Calculation (min): 45 min   Short Term Goals: Week 3:  PT Short Term Goal 1 (Week 3): Pt to initiate gait training in parallel bars with mod A PT Short Term Goal 2 (Week 3): Pt to perform sit to stand consistently with Mod A x1 PT Short Term Goal 3 (Week 3): Patient will demonstrate standing balance with CGA x 2 minutes with 1-2 UE support in prep for standing ADL PT Short Term Goal 4 (Week 3): Pt will lateral scoot left and right independent on EOB without cuing for head hips relationship  Skilled Therapeutic Interventions/Progress Updates:    Session 1: Pt reports feeling in less pain today and reports being ready to go to the gym. Pt performed bed mobility with supervision. Pt performed SBT to w/c with supervision for setup and verbal cues for not putting fingers under the board during the transfer. Pt propelled in w/c 75 ft with supervision and verbal cues through doorway. Pt performed uphill SBT to simulate bed height at home with mod A x1 and verbal cues for hand placement and head hips relationship. Pt then performed tricep pushups with wooden blocks 5 x4, able to clear bottom for most repetitions. Pt performed supine LLE hip flexor stretch 30 s x3. Pt then performed uphill SBT to and from car to simulate height of personal vehicle, requiring mod A x1 and verbal cues for leg placement on the way out of the car and hand placement and head hips relationship. Pt then propelled in w/c 75 ft back to room and performed SBT with supervision for w/c setup and board. Pt left supine in bed with HOB elevated with bed alarm on and call button in reach.   Session 2: Pt completed bed mobility supervision and SBTs downhill and level surface with S and uphill SBT transfers  mod A for hips and verbal cues for head hips relationship. Pt wheeled to gym for energy conservation. Pt performed STS transfer in parallel bars x2 with max A x2 for the first STS and mod A x2 for the second STS. Pt remained standing each time for 2 minutes with CGA x2. Pt then practiced seated balance throwing a ball forward and to L and R side with supervision for safety. Pt demonstrated good sitting balance. Pt propelled in w/c 200 ft back to room with supervision. Pt left in supine with HOB elevated and call button in reach.   Pt continues to present with decreased standing balance, decreased endurance, decreased transfers, decreased L hip extension. Pt would continue to benefit from skilled physical therapy to address these deficits.   Therapy Documentation Precautions:  Precautions Precautions: Fall Precaution Comments: new L BKA Required Braces or Orthoses: Other Brace Other Brace: L LE limb guard Restrictions Weight Bearing Restrictions: Yes LLE Weight Bearing: Non weight bearing    Therapy/Group: Individual Therapy   Dulce Sellar, SPT  Dulce Sellar 02/09/2020, 4:21 PM

## 2020-02-09 NOTE — Progress Notes (Signed)
[facility] for Infectious Disease  Date of Admission:  01/23/2020          Current antibiotics: Ertapenem  Reason for visit: Follow up on E. Cloacae and Bactereoides wound infection,   Interval events: No acute events noted Afebrile   ASSESSMENT:    #Hip fracture status post intramedullary fixation 97/04/6376 #Complicated by wound infection status post I&D 01/19/2020.  Cultures positive for Bacteroides and Enterobacter cloacae.  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  Suspect abscess at hip is more related to needing source control as opposed to failure of antibiotics --Interventional radiology to evaluate abscess for drainage --We will continue to follow  MEDICATIONS:    Scheduled Meds: . (feeding supplement) PROSource Plus  30 mL Oral BID BM  . alosetron  1 mg Oral BID  . amitriptyline  10 mg Oral QHS  . amLODipine  5 mg Oral Daily  . atorvastatin  40 mg Oral q1800  . carvedilol  12.5 mg Oral BID WC  . Chlorhexidine Gluconate Cloth  6 each Topical Q0600  . citalopram  20 mg Oral Daily  . darbepoetin (ARANESP) injection - DIALYSIS  100 mcg Intravenous Q Mon-HD  . DULoxetine  30 mg Oral Daily  . enoxaparin (LOVENOX) injection  30 mg Subcutaneous Q24H  . hydrALAZINE  100 mg Oral TID  . insulin aspart  0-5 Units Subcutaneous QHS  . insulin aspart  0-9 Units Subcutaneous TID WC  . insulin glargine  15 Units Subcutaneous QHS  . melatonin  1.5 mg Oral QHS  . morphine  15 mg Oral Q12H  . multivitamin  1 tablet Oral QHS  . pantoprazole  40 mg Oral Daily  . senna-docusate  1 tablet Oral BID  . sevelamer carbonate  1,600 mg Oral TID WC    Continuous Infusions: . sodium chloride 250 mL (02/08/20 1814)  . ertapenem 500 mg (02/08/20 1816)    PRN Meds: sodium chloride, acetaminophen, bisacodyl, calcium carbonate, cyclobenzaprine,  diphenhydrAMINE, guaiFENesin-dextromethorphan, lip balm, loratadine, Muscle Rub, oxyCODONE, [DISCONTINUED] menthol-cetylpyridinium **OR** phenol, polyvinyl alcohol, prochlorperazine **OR** prochlorperazine **OR** prochlorperazine, sodium chloride, traMADol  SUBJECTIVE:   No new complaints, afebrile.  Sitting up at the sink participating with therapy this morning.  Review of Systems: As noted above.  All other systems reviewed and are negative.   OBJECTIVE:   Allergies  Allergen Reactions  . Trazodone Swelling  . Latex Rash  . Lidocaine Itching    Blood pressure (!) 133/50, pulse 64, temperature 98.6 F (37 C), temperature source Oral, resp. rate 17, height 5\' 10"  (1.778 m), weight 89.5 kg, SpO2 91 %. Body mass index is 28.31 kg/m.  Physical Exam Constitutional:      General: She is not in acute distress.    Appearance: Normal appearance.  HENT:     Head: Normocephalic and atraumatic.  Skin:    General: Skin is warm and dry.  Neurological:     General: No focal deficit present.     Mental Status: She is alert and oriented to person, place, and time.  Psychiatric:        Mood and Affect: Mood normal.        Behavior: Behavior normal.       Lab Results & Microbiology Lab Results  Component Value Date   WBC 12.1 (H) 02/08/2020   HGB 9.6 (L) 02/08/2020   HCT 31.4 (  L) 02/08/2020   MCV 89.7 02/08/2020   PLT 212 02/08/2020    Lab Results  Component Value Date   NA 130 (L) 02/09/2020   K 3.5 02/09/2020   CO2 28 02/09/2020   GLUCOSE 108 (H) 02/09/2020   BUN 18 02/09/2020   CREATININE 4.61 (H) 02/09/2020   CALCIUM 8.4 (L) 02/09/2020   GFRNONAA 11 (L) 02/09/2020   GFRAA 13 (L) 12/17/2018    Lab Results  Component Value Date   ALT 6 01/07/2020   AST 21 01/07/2020   ALKPHOS 223 (H) 01/07/2020   BILITOT 0.7 01/07/2020     I have reviewed the micro and lab results in Epic.  Imaging No results found.      Raynelle Highland for Infectious  Disease [facility] [phone] pager 02/09/2020, 10:01 AM   I have spent a total of 25 minutes with the patient reviewing hospital notes,  test results, labs and examining the patient as well as establishing an assessment and plan.

## 2020-02-09 NOTE — Progress Notes (Signed)
[facility] NEPHROLOGY PROGRESS NOTE  Assessment/ Plan: Pt is a 54 y.o. yo female with new ESRD, left foot infection is status post a BKA, left hip fracture status post ORIF, now in CIR.  # AoCKD 5,now ESRD: Baseline Creat 4.9- 5.2, eGFR 9-11 from 8/21 - 10/21. Creat 6 on admission, then mid 7's, IVF's given w/o improvementso started HD.Has been clipped to [facility].S/p [facility] and AVF on11/8, 1st HD 11/9. Continue MWF schedule. Status post dialysis yesterday with 3.5 L net UF.  Plan for next dialysis tomorrow.  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 S/p 4U PRBCs.Tsat low 10%, got 566m Feraheme on 11/6, holding further doses due to infection. IncreasedAranesp 60 to 100 q week on 11/17.  Monitor hemoglobin.  #L foot stump chronic open wound w/ poss osteo by plain films-> s/pL BKA 01/12/20  #L hip fracture - admission diagnosis.S/p ORIF on 10/28 per ortho.S/p I&D surgical  wound 11/18. Currently on IV ertapenem until 12/30 per ID. Found to have left hip abscess status post I&D by Ortho on 12/7.  ID is following.  #HTN/ vol -lower amlodipine dose.  Also on hydralazine, coreg and norvasc.  UF during HD.  #BMM -monitor calcium and phosphorus level.  Continue sevelamer.  PTH 44.   #Hyponatremia, hypervolemic: She has high intradialytic weight gain.  Discussed about fluid restriction.  Repeat sodium level improved.  UF as tolerated.  #Nutrition: albumin very low, on protein supplement.Renal diet w/fluid restrictions.  Subjective: Seen and examined.  Patient's reports drinking plenty of water/liquid.  Denies nausea vomiting chest pain shortness of breath.   Objective Vital signs in last 24 hours: Vitals:   02/08/20 1720 02/08/20 1743 02/08/20 1930 02/09/20 0504  BP: (!) 124/58 (!) 113/45 (!) 125/48 (!) 133/50  Pulse: 64 66 67 64  Resp:  _0 Temp: 98.6 F (37 C) 99 F (37.2 C) 99.1 F (37.3 C) 98.6 F (37 C)  TempSrc: Oral Oral  Oral  SpO2:  94% 93% 91%   Weight:    89.5 kg  Height:       Weight change: 4.5 kg  Intake/Output Summary (Last 24 hours) at 02/09/2020 0933 Last data filed at 02/09/2020 0700 Gross per 24 hour  Intake 526 ml  Output 3500 ml  Net -2974 ml       Labs: Basic Metabolic Panel: Recent Labs  Lab 02/06/20 1520 02/08/20 1312 02/09/20 0731  NA 127* 124* 130*  K 4.4 4.3 3.5  CL 89* 90* 92*  CO2 _1 GLUCOSE 103* 85 108*  BUN 52* 42* 18  CREATININE 7.60* 6.47* 4.61*  CALCIUM 8.5* 8.4* 8.4*  PHOS 6.0* 5.0* 3.7   Liver Function Tests: Recent Labs  Lab 02/06/20 1520 02/08/20 1312 02/09/20 0731  ALBUMIN 2.2* 2.2* 2.2*   No results for input(s): LIPASE, AMYLASE in the last 168 hours. No results for input(s): AMMONIA in the last 168 hours. CBC: Recent Labs  Lab 02/03/20 1324 02/06/20 1520 02/08/20 1312  WBC 11.3* 12.0* 12.1*  HGB 9.6* 10.0* 9.6*  HCT 31.4* 31.5* 31.4*  MCV 90.2 88.7 89.7  PLT 285 287 212   Cardiac Enzymes: No results for input(s): CKTOTAL, CKMB, CKMBINDEX, TROPONINI in the last 168 hours. CBG: Recent Labs  Lab 02/08/20 0611 02/08/20 1215 02/08/20 1729 02/08/20 2045 02/09/20 0611  GLUCAP 83 78 95 161* 107*    Iron Studies: No results for input(s): IRON, TIBC, TRANSFERRIN, FERRITIN in the last 72 hours. Studies/Results: No results found.  Medications: Infusions: . sodium chloride 250 mL (02/08/20 1814)  . ertapenem 500 mg (02/08/20 1816)    Scheduled Medications: . (feeding supplement) PROSource Plus  30 mL Oral BID BM  . alosetron  1 mg Oral BID  . amitriptyline  10 mg Oral QHS  . amLODipine  5 mg Oral Daily  . atorvastatin  40 mg Oral q1800  . carvedilol  12.5 mg Oral BID WC  . Chlorhexidine Gluconate Cloth  6 each Topical Q0600  . citalopram  20 mg Oral Daily  . darbepoetin (ARANESP) injection - DIALYSIS  100 mcg Intravenous Q Mon-HD  . DULoxetine  30 mg Oral Daily  . enoxaparin (LOVENOX) injection  30 mg Subcutaneous Q24H  . hydrALAZINE  100 mg  Oral TID  . insulin aspart  0-5 Units Subcutaneous QHS  . insulin aspart  0-9 Units Subcutaneous TID WC  . insulin glargine  15 Units Subcutaneous QHS  . melatonin  1.5 mg Oral QHS  . morphine  15 mg Oral Q12H  . multivitamin  1 tablet Oral QHS  . pantoprazole  40 mg Oral Daily  . senna-docusate  1 tablet Oral BID  . sevelamer carbonate  1,600 mg Oral TID WC    have reviewed scheduled and prn medications.  Physical Exam: General:NAD, comfortable Heart:RRR, s1s2 nl Lungs: Clear b/l, no crackle Abdomen:soft, Non-tender, non-distended Extremities:No edema, left BKA Dialysis Access: Right IJ TDC, maturing left AV fistula.  [name] [name] 02/09/2020,9:33 AM  LOS: 17 days  Pager: [phone]

## 2020-02-10 ENCOUNTER — Inpatient Hospital Stay (HOSPITAL_COMMUNITY): Payer: Medicaid Other

## 2020-02-10 ENCOUNTER — Inpatient Hospital Stay (HOSPITAL_COMMUNITY): Payer: Medicaid Other | Admitting: Occupational Therapy

## 2020-02-10 LAB — CBC
HCT: 31.7 % — ABNORMAL LOW (ref 36.0–46.0)
Hemoglobin: 9.4 g/dL — ABNORMAL LOW (ref 12.0–15.0)
MCH: 27.1 pg (ref 26.0–34.0)
MCHC: 29.7 g/dL — ABNORMAL LOW (ref 30.0–36.0)
MCV: 91.4 fL (ref 80.0–100.0)
Platelets: 223 10*3/uL (ref 150–400)
RBC: 3.47 MIL/uL — ABNORMAL LOW (ref 3.87–5.11)
RDW: 15.9 % — ABNORMAL HIGH (ref 11.5–15.5)
WBC: 11.5 10*3/uL — ABNORMAL HIGH (ref 4.0–10.5)
nRBC: 0 % (ref 0.0–0.2)

## 2020-02-10 LAB — GLUCOSE, CAPILLARY
Glucose-Capillary: 88 mg/dL (ref 70–99)
Glucose-Capillary: 88 mg/dL (ref 70–99)
Glucose-Capillary: 101 mg/dL — ABNORMAL HIGH (ref 70–99)
Glucose-Capillary: 177 mg/dL — ABNORMAL HIGH (ref 70–99)

## 2020-02-10 LAB — RENAL FUNCTION PANEL
Albumin: 2.1 g/dL — ABNORMAL LOW (ref 3.5–5.0)
Anion gap: 11 (ref 5–15)
BUN: 34 mg/dL — ABNORMAL HIGH (ref 6–20)
CO2: 28 mmol/L (ref 22–32)
Calcium: 8.6 mg/dL — ABNORMAL LOW (ref 8.9–10.3)
Chloride: 92 mmol/L — ABNORMAL LOW (ref 98–111)
Creatinine, Ser: 6.56 mg/dL — ABNORMAL HIGH (ref 0.44–1.00)
GFR, Estimated: 7 mL/min — ABNORMAL LOW (ref >60)
Glucose, Bld: 82 mg/dL (ref 70–99)
Phosphorus: 4 mg/dL (ref 2.5–4.6)
Potassium: 3.6 mmol/L (ref 3.5–5.1)
Sodium: 131 mmol/L — ABNORMAL LOW (ref 135–145)

## 2020-02-10 LAB — SYNOVIAL CELL COUNT + DIFF, W/ CRYSTALS
Crystals, Fluid: NONE SEEN
Eosinophils-Synovial: 0 % (ref 0–1)
Lymphocytes-Synovial Fld: 26 % — ABNORMAL HIGH (ref 0–20)
Monocyte-Macrophage-Synovial Fluid: 12 % — ABNORMAL LOW (ref 50–90)
Neutrophil, Synovial: 62 % — ABNORMAL HIGH (ref 0–25)
WBC, Synovial: 133125 /mm3 — ABNORMAL HIGH (ref 0–200)

## 2020-02-10 MED ORDER — MIDAZOLAM HCL 2 MG/2ML IJ SOLN
INTRAMUSCULAR | Status: AC
  Filled 2020-02-10: qty 4

## 2020-02-10 MED ORDER — FENTANYL CITRATE (PF) 100 MCG/2ML IJ SOLN
INTRAMUSCULAR | Status: AC | PRN
Start: 2020-02-10 — End: 2020-02-10
  Administered 2020-02-10 (×2): 50 ug via INTRAVENOUS

## 2020-02-10 MED ORDER — LIDOCAINE HCL 1 % IJ SOLN
INTRAMUSCULAR | Status: AC
  Filled 2020-02-10: qty 20

## 2020-02-10 MED ORDER — MIDAZOLAM HCL 2 MG/2ML IJ SOLN
INTRAMUSCULAR | Status: AC | PRN
  Administered 2020-02-10: 1 mg via INTRAVENOUS

## 2020-02-10 MED ORDER — FENTANYL CITRATE (PF) 100 MCG/2ML IJ SOLN
INTRAMUSCULAR | Status: AC
  Filled 2020-02-10: qty 4

## 2020-02-10 MED ORDER — TETRACAINE HCL 1 % IJ SOLN
10.0000 mg | Freq: Once | INTRAMUSCULAR | Status: AC
  Administered 2020-02-10: 1 mL
  Filled 2020-02-10: qty 2

## 2020-02-10 MED ORDER — HEPARIN SODIUM (PORCINE) 1000 UNIT/ML IJ SOLN
INTRAMUSCULAR | Status: AC
  Filled 2020-02-10: qty 4

## 2020-02-10 NOTE — Procedures (Signed)
Interventional Radiology Procedure Note  Procedure: CT ASP LEFT POSTERIOR HIP ABSCESS    Complications: None  Estimated Blood Loss:  MIN  Findings: 40CC PURULENT FLD ASPIRATED CX SENT    [name] Punt, MD

## 2020-02-10 NOTE — Progress Notes (Signed)
[facility] PHYSICAL MEDICINE & REHABILITATION PROGRESS NOTE   Subjective/Complaints:  Pt reports tiny BM yesterday- doesn't want help with stool help.   Pain a little better.   IR planned today for abscesses.   ROS:   Pt denies SOB, abd pain, CP, N/V/C/D, and vision changes   Objective:   No results found. Recent Labs    02/08/20 1312  WBC 12.1*  HGB 9.6*  HCT 31.4*  PLT 212   Recent Labs    02/08/20 1312 02/09/20 0731  NA 124* 130*  K 4.3 3.5  CL 90* 92*  CO2 24 28  GLUCOSE 85 108*  BUN 42* 18  CREATININE 6.47* 4.61*  CALCIUM 8.4* 8.4*    Intake/Output Summary (Last 24 hours) at 02/10/2020 3546 Last data filed at 02/10/2020 0700 Gross per 24 hour  Intake 275 ml  Output -  Net 275 ml     Pressure Injury 01/23/20 Buttocks Left Deep Tissue Pressure Injury - Purple or maroon localized area of discolored intact skin or blood-filled blister due to damage of underlying soft tissue from pressure and/or shear. Circular, dark, red area, surrounde (Active)  01/23/20 1609  Location: Buttocks  Location Orientation: Left  Staging: Deep Tissue Pressure Injury - Purple or maroon localized area of discolored intact skin or blood-filled blister due to damage of underlying soft tissue from pressure and/or shear.  Wound Description (Comments): Circular, dark, red area, surrounded by MASD  Present on Admission: Yes    Physical Exam: Vital Signs Blood pressure (!) 137/56, pulse 60, temperature 98.8 F (37.1 C), temperature source Oral, resp. rate 16, height 5\' 10"  (1.778 m), weight 90.7 kg, SpO2 98 %. Constitutional: sitting up in bed- appropriate, depressed affect, NAD HEENT: EOMI, oral membranes moist Neck: supple Cardiovascular: RRR Respiratory/Chest: CTA B/L- no W/R/R- good air movement GI/Abdomen: Soft, NT, ND, (+)BS  Ext: no clubbing, cyanosis, or edema Psych: depressed/flat affect still- no change Skin: Warm and dry.  L BKA-  Well shaped with shrinker.  L hip  looks great- sutures out-steristrips-  L thigh is VERY swollen and L hip incision is draining slightly- through dressing- serosanguinous drainage.  Musc: Left BKA with mild edema and tenderness Right lower extremity edema, stable Neuro: Alert Motor: Right lower extremity: Hip flexion, knee extension 4-5/5 (pain inhibition), ankle dorsiflexion 4+/5, stable exam Left lower extremity: Hip flexion, knee extension 3/5 (some pain inhibition), ankle dorsiflexion 4/5   Assessment/Plan: 1. Functional deficits which require 3+ hours per day of interdisciplinary therapy in a comprehensive inpatient rehab setting.  Physiatrist is providing close team supervision and 24 hour management of active medical problems listed below.  Physiatrist and rehab team continue to assess barriers to discharge/monitor patient progress toward functional and medical goals  Care Tool:  Bathing    Body parts bathed by patient: Right arm,Left arm,Chest,Abdomen,Face,Right upper leg,Left upper leg,Front perineal area,Right lower leg,Left lower leg   Body parts bathed by helper: Buttocks,Right lower leg Body parts n/a: Left lower leg   Bathing assist Assist Level: Minimal Assistance - Patient > 75%     Upper Body Dressing/Undressing Upper body dressing   What is the patient wearing?: Pull over shirt    Upper body assist Assist Level: Set up assist    Lower Body Dressing/Undressing Lower body dressing      What is the patient wearing?: Pants     Lower body assist Assist for lower body dressing: Minimal Assistance - Patient > 75% (lateral leans EOB)  Toileting Toileting Toileting Activity did not occur Landscape architect and hygiene only): N/A (no void or bm)  Toileting assist Assist for toileting: Maximal Assistance - Patient 25 - 49% Assistive Device Comment: female urinal   Transfers Chair/bed transfer  Transfers assist  Chair/bed transfer activity did not occur: Safety/medical  concerns  Chair/bed transfer assist level: Minimal Assistance - Patient > 75% Chair/bed transfer assistive device: Sliding board   Locomotion Ambulation   Ambulation assist   Ambulation activity did not occur: Safety/medical concerns          Walk 10 feet activity   Assist  Walk 10 feet activity did not occur: Safety/medical concerns        Walk 50 feet activity   Assist Walk 50 feet with 2 turns activity did not occur: Safety/medical concerns         Walk 150 feet activity   Assist Walk 150 feet activity did not occur: Safety/medical concerns         Walk 10 feet on uneven surface  activity   Assist Walk 10 feet on uneven surfaces activity did not occur: Safety/medical concerns         Wheelchair     Assist Will patient use wheelchair at discharge?: Yes Type of Wheelchair: Manual    Wheelchair assist level: Supervision/Verbal cueing Max wheelchair distance: 200    Wheelchair 50 feet with 2 turns activity    Assist        Assist Level: Supervision/Verbal cueing   Wheelchair 150 feet activity     Assist      Assist Level: Supervision/Verbal cueing   Medical Problem List and Plan: 1.  Impaired mobility and ADLs secondary to L BKA  Continue CIR 2.  Antithrombotics: -DVT/anticoagulation:  Pharmaceutical: Lovenox             -antiplatelet therapy: N/A 3. Chronic pain/Pain Management: Was on oxycodone 5 mg tid prn as well Cymbalta for neuropathy (at baseline)- now on 10-15 mg q4 hours prn  12/2- will start MS Contin 15 mg BID  12/3-5- MUCH improved pain for pt- con't MS Contin  12/9- pain a little worse today- change flexeril to TID prn  12/10- says pain has been a little better- con't regimen 4. Mood: Team to provide ego support/encouragement.              -antipsychotic agents: N/A 5. Neuropsych: This patient is capable of making decisions on her own behalf. 6. Skin/Wound Care: Monitor wound daily   Continue stump  shrinker   11/30- will call Vascular on Thursday about getting sutures out of L BKA- has been 4 weeks.   12/3- L/M- with vascular  12/5 vascular hasn't seen pt yet--primary team will need to reach out again tomorrow  12/6- will check with vascular if can get staples out-  7. Fluids/Electrolytes/Nutrition: Strict I/O. 1200 cc FR. Will consult dietician to educate on renal diet.  8. Left hip ORIF/hip abscess s/p I&D 11/18: NWB LLE.   Flagyl changed to Ertepenem due to patient refusal, continue  12/1- on Ertepenem until end of December.  12/7- pt has new rim enhancing lesions in L hip, thigh/glute, etc- have called Ortho and also calling ID to discuss- might have been since switched off Flagyl?   12/9- ID suggested going to IR to get abscesses drained- WBC is still 12.1k- con't Ertepenem  12/10- IR today for draining abscesses  9. L-BKA due to chronic wound/osteo:   See #8, continue antibiotics x 4 weeks  10. T1DM with neuropathy: Hgb A1c-6.2. Was on Lantus 50 units in am with 10 units novolog tid.     Lantus 38 twice daily, decreased to 10 on 11/27, increased to 15 on 11/28  NovoLog 5 units with breakfast, 8 units with lunch and dinner, decreased to 5 3 times daily on 11/27, DC'd on 11/27.  12/8- Had hypoglycemia x1- of 69- con't regimen for now  12/10- BGs 88-188- con't regimen 11. Now ESRD: Now on HD MWF (set up at [place] post discharge)  12. Anemia of chronic disease/iron deficiency: Hgb-6.3 at admission--->9.1 s/p 4 units PRBC. Feraheme X 1 (additional dose held due to infection) and Aranesp weekly.   Hemoglobin 9.7 on 11/26  Continue to monitor 13. HTN: Monitor BP tid--continue Coreg bid, Norvasc and apresoline tid.   Decreased Coreg to 12.5mg  BID.   12/5 BP controlled 14. IBS-D:   MiraLAX DC'd  Resumed Lotrenex    Colace as needed ordered  12/2- will make colace scheduled and add sorbitol if no BM by 3pm  12/4 changed colace to senna-s given addn of MS Contin  12/6-  refusing laxatives- but had BM Saturday and this AM, so con't regimen  12/9- LBM 2 days ago- feels like can go today- will wait on sorbitol until tomorrow  12/10- tiny BM yesterday -refuses additional bowel meds. Explained on pain meds- needs bowel meds 15. Malnutrition: Continue protein supplement.  16. Abdominal pain: Due to diarrhea? PPI resumed-->patient to have family bring in [place] from home.  17.  Sleep disturbance:   Low dose elavil at bedtime.  Melatonin added on 11/26  Improving overall 18. Leukocytosis:   WBCs 11.6 on 11/26, labs with HD  See #8, #9  Afebrile  12/3- WBC 11.1- afebrile; no Sx's- con't to monitor  12/6- WBC 11.3- cause??  12/7- WBC 12.0- due to abscesses in L thigh, glute, etc causing edema- have contacted Ortho AND ID- waiting to hear back.   12/9- ID is calling IR to get abscesses drained.  12/10- IR doing IR drainage today  19. LE/thigh edema  12/6- will check CT of abd/pelvis with contrast to determine cause-got OK by nephrology to use contrast- has 2-3+ edema of thighs L>R  12/7- due to rim enhancing lesions- as above.   12/8- will con't Ertepenem- per ID.  20. Depression-new-  12/8- will add Celexa 20 mg daily- max DOSE  12/9- had to decrease Duloxetine to 30 mg daily.    LOS: 18 days A FACE TO FACE EVALUATION WAS PERFORMED  [name] 02/10/2020, 8:32 AM

## 2020-02-10 NOTE — Progress Notes (Signed)
    [place] for Infectious Disease   Date of Admission:  01/23/2020     Reason for visit: Follow up onE. Cloacae and Bactereoides wound infection  Interval History:  No acute events noted overnight.  Remains afebrile.  Patient taken to CT for IR guided drainage of fluid this morning and not at bedside when attempted to see her.  Assessment:  #Hip fracture status post intramedullary fixation 29/29/0903 #Complicated by wound infection status post I&D 01/19/2020.  Cultures positive for Bacteroides and Enterobacter cloacae.  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  Continue ertapenem.  Follow up cultures after IR drainage -- Will follow   Raynelle Highland for Infectious [place] [phone] pager 02/10/2020, 10:21 AM

## 2020-02-10 NOTE — Progress Notes (Cosign Needed)
Occupational Therapy Session Note  Patient Details  Name: [name] MRN: [id number] Date of Birth: October 26, 1965  Today's Date: 02/10/2020 OT Individual Time: 3154-0086 OT Individual Time Calculation (min): 72 min   Short Term Goals: Week 2:  OT Short Term Goal 1 (Week 2): Pt will perform BSC/toilet transfer with Mod of 1 OT Short Term Goal 1 - Progress (Week 2): Met OT Short Term Goal 2 (Week 2): Pt will perform LB dress EOB level with Mod A and lateral leans with AE PRN OT Short Term Goal 2 - Progress (Week 2): Met OT Short Term Goal 3 (Week 2): Pt will perform sit to stand Mod A in prep for LB ADL OT Short Term Goal 3 - Progress (Week 2): Progressing toward goal  Skilled Therapeutic Interventions/Progress Updates:    Patient greeted semi-reclined in bed and agreeable to OT treatment session. Pt reported she needed to go to the bathroom. OT encouraged pt to try to get to [facility], but pt stated it hurt her hip too bad to sit on commode so she opted for bed pan. Pt was able to pull down pants and roll with supervision for bed pan placement. Pt with continent BM in bed pan. OT assist for peri-care, pt able to manage clothing using rolling and bridging. Pt completed bed mobility with supervision. Pt placed slideboard and only needed CGA from OT to perform scoot across board. Pt propelled wc to the sink and completed UB bathing/dressing, as well as toothbrushing task. Pt propelled wc to therapy gym and completed 5 mins x 2 on UE Ergometer while listening to patient preferred 70's music. Pt completed 3 sets of 10 wc pushups, L knee extension, and glute squeezes. Pt propelled wc back to room and transferred back to bed with slideboard and min A. Pt to go down for procedure this morning for hip drainage.  Therapy Documentation Precautions:  Precautions Precautions: Fall Precaution Comments: new L BKA Required Braces or Orthoses: Other Brace Other Brace: L LE limb guard Restrictions Weight  Bearing Restrictions: Yes LLE Weight Bearing: Non weight bearing Pain: Denies pain  Therapy/Group: Individual Therapy  Valma Cava 02/10/2020, 7:51 AM

## 2020-02-10 NOTE — Progress Notes (Signed)
[name]  Assessment/ Plan: Pt is a 54 y.o. yo female with new ESRD, left foot infection is status post a BKA, left hip fracture status post ORIF, now in CIR.  # AoCKD 5,now ESRD: Baseline Creat 4.9- 5.2, eGFR 9-11 from 8/21 - 10/21. Creat 6 on admission, then mid 7's, IVF's given w/o improvementso started HD.Has been clipped to [facility].S/p [facility] and AVF on11/8, 1st HD 11/9. Continue MWF schedule. Plan for HD today.  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 S/p 4U PRBCs.Tsat low 10%, got 575m Feraheme on 11/6, holding further doses due to infection. IncreasedAranesp 60 to 100 q week on 11/17.  Monitor hemoglobin.  #L foot stump chronic open wound w/ poss osteo by plain films-> s/pL BKA 01/12/20  #L hip fracture - admission diagnosis.S/p ORIF on 10/28 per ortho.S/p I&D surgical  wound 11/18. Currently on IV ertapenem until 12/30 per ID. Found to have left hip abscess status post I&D by Ortho on 12/7.  ID is following.  Plan for aspiration of hip fluid collection today by IR.  #HTN/ vol -lower amlodipine dose.  Also on hydralazine, coreg and norvasc.  UF during HD.  #BMM -monitor calcium and phosphorus level.  Continue sevelamer.  PTH 44.   #Hyponatremia, hypervolemic: She has high intradialytic weight gain.  Discussed about fluid restriction.  Repeat sodium level improved.  UF as tolerated.  #Nutrition: albumin very low, on protein supplement.Renal diet w/fluid restrictions.  Subjective: Seen and examined.  Plan for IR procedure today.  Denies nausea vomiting chest pain shortness of breath.    Objective Vital signs in last 24 hours: Vitals:   02/09/20 0504 02/09/20 1300 02/09/20 1932 02/10/20 0507  BP: (!) 133/50 (!) 136/54 (!) 146/55 (!) 137/56  Pulse: 64 62 66 60  Resp: '17 16 17 16  ' Temp: 98.6 F (37 C) 98.3 F (36.8 C) 98.7 F (37.1 C) 98.8 F (37.1 C)  TempSrc: Oral Oral Oral Oral  SpO2: 91% 99% 98% 98%  Weight: 89.5 kg    90.7 kg  Height:       Weight change: -3.2 kg  Intake/Output Summary (Last 24 hours) at 02/10/2020 0932 Last data filed at 02/10/2020 0700 Gross per 24 hour  Intake 275 ml  Output --  Net 275 ml       Labs: Basic Metabolic Panel: Recent Labs  Lab 02/06/20 1520 02/08/20 1312 02/09/20 0731  NA 127* 124* 130*  K 4.4 4.3 3.5  CL 89* 90* 92*  CO2 '28 24 28  ' GLUCOSE 103* 85 108*  BUN 52* 42* 18  CREATININE 7.60* 6.47* 4.61*  CALCIUM 8.5* 8.4* 8.4*  PHOS 6.0* 5.0* 3.7   Liver Function Tests: Recent Labs  Lab 02/06/20 1520 02/08/20 1312 02/09/20 0731  ALBUMIN 2.2* 2.2* 2.2*   No results for input(s): LIPASE, AMYLASE in the last 168 hours. No results for input(s): AMMONIA in the last 168 hours. CBC: Recent Labs  Lab 02/03/20 1324 02/06/20 1520 02/08/20 1312  WBC 11.3* 12.0* 12.1*  HGB 9.6* 10.0* 9.6*  HCT 31.4* 31.5* 31.4*  MCV 90.2 88.7 89.7  PLT 285 287 212   Cardiac Enzymes: No results for input(s): CKTOTAL, CKMB, CKMBINDEX, TROPONINI in the last 168 hours. CBG: Recent Labs  Lab 02/09/20 0611 02/09/20 1215 02/09/20 1634 02/09/20 2101 02/10/20 0606  GLUCAP 107* 146* 102* 186* 88    Iron Studies: No results for input(s): IRON, TIBC, TRANSFERRIN, FERRITIN in the last 72 hours. Studies/Results: No results found.  Medications: Infusions: . sodium chloride 10 mL (02/09/20 1748)  . ertapenem 500 mg (02/09/20 1752)    Scheduled Medications: . (feeding supplement) PROSource Plus  30 mL Oral BID BM  . alosetron  1 mg Oral BID  . amitriptyline  10 mg Oral QHS  . amLODipine  5 mg Oral Daily  . atorvastatin  40 mg Oral q1800  . carvedilol  12.5 mg Oral BID WC  . Chlorhexidine Gluconate Cloth  6 each Topical Q0600  . citalopram  20 mg Oral Daily  . darbepoetin (ARANESP) injection - DIALYSIS  100 mcg Intravenous Q Mon-HD  . DULoxetine  30 mg Oral Daily  . enoxaparin (LOVENOX) injection  30 mg Subcutaneous Q24H  . hydrALAZINE  100 mg Oral TID  .  insulin aspart  0-5 Units Subcutaneous QHS  . insulin aspart  0-9 Units Subcutaneous TID WC  . insulin glargine  15 Units Subcutaneous QHS  . melatonin  1.5 mg Oral QHS  . morphine  15 mg Oral Q12H  . multivitamin  1 tablet Oral QHS  . pantoprazole  40 mg Oral Daily  . senna-docusate  1 tablet Oral BID  . sevelamer carbonate  1,600 mg Oral TID WC    have reviewed scheduled and prn medications.  Physical Exam: General:NAD, comfortable Heart:RRR, s1s2 nl Lungs: Clear b/l, no crackle Abdomen:soft, Non-tender, non-distended Extremities:No LE edema, left BKA Dialysis Access: Right IJ TDC, maturing left AV fistula.  [name] [name] 02/10/2020,9:32 AM  LOS: 18 days  Pager: [phone]

## 2020-02-10 NOTE — Progress Notes (Signed)
Physical Therapy Note  Patient Details  Name: [name] MRN: [id number] Date of Birth: Feb 03, 1966 Today's Date: 02/10/2020    Patient missed 60 minutes of skilled PT due to out of room for procedure and HD planned for later today.    Reginia Naas, PT 02/10/2020, 12:56 PM

## 2020-02-10 NOTE — Progress Notes (Signed)
Occupational Therapy Session Note  Patient Details  Name: [name] MRN: [id number] Date of Birth: 17-Jul-1965  Today's Date: 02/10/2020 OT Individual Time:  - 60 minutes missed    Short Term Goals: Week 3:  OT Short Term Goal 1 (Week 3): Pt will perform sit to stand Mod A in prep for LB ADL OT Short Term Goal 2 (Week 3): Pt will perform clothing management with lateral leans on BSC with no more than Mod A OT Short Term Goal 3 (Week 3): Pt will perform BSC transfers with supervision OT Short Term Goal 4 (Week 3): Pt will perform 10 minute ADL/activity of choice without fatigue or rest break  Skilled Therapeutic Interventions/Progress Updates:    Pt seen for scheduled therapy after medical procedure but with bedrest orders from MD. Therefore OT tx time missed.   Therapy Documentation Precautions:  Precautions Precautions: Fall Precaution Comments: new L BKA Required Braces or Orthoses: Other Brace Other Brace: L LE limb guard Restrictions Weight Bearing Restrictions: Yes LLE Weight Bearing: Non weight bearing Vital Signs: Therapy Vitals Temp: 98.8 F (37.1 C) Temp Source: Oral Pulse Rate: 60 Resp: 16 BP: (!) 137/56 Patient Position (if appropriate): Lying Oxygen Therapy SpO2: 98 % O2 Device: Room Air ADL: ADL Upper Body Bathing: Contact guard Lower Body Bathing: Maximal assistance Where Assessed-Lower Body Bathing: Bed level Upper Body Dressing: Minimal assistance Where Assessed-Upper Body Dressing: Edge of bed Lower Body Dressing: Dependent Where Assessed-Lower Body Dressing: Bed level Toileting: Not assessed (incontinent of bowel, did not need to urinate)      Therapy/Group: Individual Therapy  Kamora Vossler A Emeree Mahler 02/10/2020, 7:11 AM

## 2020-02-11 ENCOUNTER — Encounter (HOSPITAL_COMMUNITY): Payer: Medicaid Other | Admitting: Occupational Therapy

## 2020-02-11 LAB — GLUCOSE, CAPILLARY
Glucose-Capillary: 105 mg/dL — ABNORMAL HIGH (ref 70–99)
Glucose-Capillary: 101 mg/dL — ABNORMAL HIGH (ref 70–99)
Glucose-Capillary: 131 mg/dL — ABNORMAL HIGH (ref 70–99)
Glucose-Capillary: 150 mg/dL — ABNORMAL HIGH (ref 70–99)

## 2020-02-11 NOTE — Progress Notes (Signed)
[place] PHYSICAL MEDICINE & REHABILITATION PROGRESS NOTE   Subjective/Complaints: No complaints this morning Ordered CBC and BMP for Monday  She is feeling well this morning Pain is well controlled  ROS:  Pt denies SOB, abd pain, CP, N/V/C/D, and vision changes  Objective:   CT ASPIRATION  Result Date: 02/10/2020 INDICATION: Posterior left hip intramuscular abscess EXAM: CT ASPIRATION POSTERIOR LEFT HIP ABSCESS MEDICATIONS: The patient is currently admitted to the hospital and receiving intravenous antibiotics. The antibiotics were administered within an appropriate time frame prior to the initiation of the procedure. ANESTHESIA/SEDATION: Fentanyl 100 mcg IV; Versed 1.0 mg IV Moderate Sedation Time:  10 MINUTES The patient was continuously monitored during the procedure by the interventional radiology nurse under my direct supervision. COMPLICATIONS: None immediate. PROCEDURE: Informed written consent was obtained from the patient after a thorough discussion of the procedural risks, benefits and alternatives. All questions were addressed. Maximal Sterile Barrier Technique was utilized including caps, mask, sterile gowns, sterile gloves, sterile drape, hand hygiene and skin antiseptic. A timeout was performed prior to the initiation of the procedure. previous imaging reviewed. patient positioned prone. noncontrast localization ct performed. the left posterior hip intramuscular abscess was localized and marked. under sterile conditions and local anesthesia, an 18 gauge 10 cm access was advanced into the small fluid collection. syringe aspiration yielded 40 cc purulent fluid. sample sent for culture. needle removed. patient tolerated the procedure well. no immediate complication. IMPRESSION: Successful CT-guided left hip intramuscular abscess needle aspiration. Sample sent for culture. Electronically Signed   By: Jerilynn Mages.  Shick M.D.   On: 02/10/2020 11:54   Recent Labs    02/08/20 1312  02/10/20 1716  WBC 12.1* 11.5*  HGB 9.6* 9.4*  HCT 31.4* 31.7*  PLT 212 223   Recent Labs    02/09/20 0731 02/10/20 1716  NA 130* 131*  K 3.5 3.6  CL 92* 92*  CO2 28 28  GLUCOSE 108* 82  BUN 18 34*  CREATININE 4.61* 6.56*  CALCIUM 8.4* 8.6*    Intake/Output Summary (Last 24 hours) at 02/11/2020 1133 Last data filed at 02/11/2020 0755 Gross per 24 hour  Intake 168 ml  Output 3000 ml  Net -2832 ml     Pressure Injury 01/23/20 Buttocks Left Deep Tissue Pressure Injury - Purple or maroon localized area of discolored intact skin or blood-filled blister due to damage of underlying soft tissue from pressure and/or shear. Circular, dark, red area, surrounde (Active)  01/23/20 1609  Location: Buttocks  Location Orientation: Left  Staging: Deep Tissue Pressure Injury - Purple or maroon localized area of discolored intact skin or blood-filled blister due to damage of underlying soft tissue from pressure and/or shear.  Wound Description (Comments): Circular, dark, red area, surrounded by MASD  Present on Admission: Yes    Physical Exam: Vital Signs Blood pressure (!) 135/52, pulse (!) 57, temperature 97.8 F (36.6 C), temperature source Oral, resp. rate 16, height 5\' 10"  (1.778 m), weight 87.1 kg, SpO2 98 %. Gen: no distress, normal appearing HEENT: oral mucosa pink and moist, NCAT Cardio: Reg rate Chest: normal effort, normal rate of breathing Abd: soft, non-distended Ext: no edema Psych: depressed/flat affect still- no change Skin: Warm and dry.  L BKA-  Well shaped with shrinker.  L hip looks great- sutures out-steristrips-  L thigh is VERY swollen and L hip incision is draining slightly- through dressing- serosanguinous drainage.  Musc: Left BKA with mild edema and tenderness Right lower extremity edema, stable Neuro: Alert Motor:  Right lower extremity: Hip flexion, knee extension 4-5/5 (pain inhibition), ankle dorsiflexion 4+/5, stable exam Left lower extremity: Hip  flexion, knee extension 3/5 (some pain inhibition), ankle dorsiflexion 4/5   Assessment/Plan: 1. Functional deficits which require 3+ hours per day of interdisciplinary therapy in a comprehensive inpatient rehab setting.  Physiatrist is providing close team supervision and 24 hour management of active medical problems listed below.  Physiatrist and rehab team continue to assess barriers to discharge/monitor patient progress toward functional and medical goals  Care Tool:  Bathing    Body parts bathed by patient: Right arm,Left arm,Chest,Abdomen,Face,Right upper leg,Left upper leg,Front perineal area,Right lower leg,Left lower leg   Body parts bathed by helper: Buttocks,Right lower leg Body parts n/a: Left lower leg   Bathing assist Assist Level: Minimal Assistance - Patient > 75%     Upper Body Dressing/Undressing Upper body dressing   What is the patient wearing?: Pull over shirt    Upper body assist Assist Level: Set up assist    Lower Body Dressing/Undressing Lower body dressing      What is the patient wearing?: Pants     Lower body assist Assist for lower body dressing: Minimal Assistance - Patient > 75% (lateral leans EOB)     Toileting Toileting Toileting Activity did not occur (Clothing management and hygiene only): N/A (no void or bm)  Toileting assist Assist for toileting: Maximal Assistance - Patient 25 - 49% Assistive Device Comment: female urinal   Transfers Chair/bed transfer  Transfers assist  Chair/bed transfer activity did not occur: Safety/medical concerns  Chair/bed transfer assist level: Minimal Assistance - Patient > 75% Chair/bed transfer assistive device: Sliding board   Locomotion Ambulation   Ambulation assist   Ambulation activity did not occur: Safety/medical concerns          Walk 10 feet activity   Assist  Walk 10 feet activity did not occur: Safety/medical concerns        Walk 50 feet activity   Assist Walk 50  feet with 2 turns activity did not occur: Safety/medical concerns         Walk 150 feet activity   Assist Walk 150 feet activity did not occur: Safety/medical concerns         Walk 10 feet on uneven surface  activity   Assist Walk 10 feet on uneven surfaces activity did not occur: Safety/medical concerns         Wheelchair     Assist Will patient use wheelchair at discharge?: Yes Type of Wheelchair: Manual    Wheelchair assist level: Supervision/Verbal cueing Max wheelchair distance: 200    Wheelchair 50 feet with 2 turns activity    Assist        Assist Level: Supervision/Verbal cueing   Wheelchair 150 feet activity     Assist      Assist Level: Supervision/Verbal cueing   Medical Problem List and Plan: 1.  Impaired mobility and ADLs secondary to L BKA  Continue CIR 2.  Antithrombotics: -DVT/anticoagulation:  Pharmaceutical: Lovenox             -antiplatelet therapy: N/A 3. Chronic pain/Pain Management: Was on oxycodone 5 mg tid prn as well Cymbalta for neuropathy (at baseline)- now on 10-15 mg q4 hours prn  12/2- will start MS Contin 15 mg BID  [facility]/3-5[facility] improved pain for pt- con't MS Contin  12/9- pain a little worse today- change flexeril to TID prn  12/11: pain is well controlled: continue current regimen.  4. Mood: Team to provide ego support/encouragement.              -antipsychotic agents: N/A 5. Neuropsych: This patient is capable of making decisions on her own behalf. 6. Skin/Wound Care: Monitor wound daily   Continue stump shrinker   11/30- will call Vascular on Thursday about getting sutures out of L BKA- has been 4 weeks.   12/3- L/M- with vascular  12/5 vascular hasn't seen pt yet--primary team will need to reach out again tomorrow  12/6- will check with vascular if can get staples out-  7. Fluids/Electrolytes/Nutrition: Strict I/O. 1200 cc FR. Will consult dietician to educate on renal diet.  8. Left hip ORIF/hip  abscess s/p I&D 11/18: NWB LLE.   Flagyl changed to Ertepenem due to patient refusal, continue  12/1- on Ertepenem until end of December.  12/7- pt has new rim enhancing lesions in L hip, thigh/glute, etc- have called Ortho and also calling ID to discuss- might have been since switched off Flagyl?   12/9- ID suggested going to IR to get abscesses drained- WBC is still 12.1k- con't Ertepenem  12/10- IR today for draining abscesses  12/11: CBC ordered for Monday.   9. 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 T1DM with neuropathy: Hgb A1c-6.2. Was on Lantus 50 units in am with 10 units novolog tid.     Lantus 38 twice daily, decreased to 10 on 11/27, increased to 15 on 11/28  NovoLog 5 units with breakfast, 8 units with lunch and dinner, decreased to 5 3 times daily on 11/27, DC'd on 11/27.  12/8- Had hypoglycemia x1- of 69- con't regimen for now  12/11- BGs 88-177- continue regimen.  11. Now ESRD: Now on HD MWF (set up at [facility] post discharge)  12. Anemia of chronic disease/iron deficiency: Hgb-6.3 at admission--->9.1 s/p 4 units PRBC. Feraheme X 1 (additional dose held due to infection) and Aranesp weekly.   Hemoglobin 9.7 on 11/26  Continue to monitor 13. HTN: Monitor BP tid--continue Coreg bid, Norvasc and apresoline tid.   Decreased Coreg to 12.5mg  BID.   12/5 BP controlled 14. IBS-D:   MiraLAX DC'd  Resumed Lotrenex    Colace as needed ordered  12/2- will make colace scheduled and add sorbitol if no BM by 3pm  12/4 changed colace to senna-s given addn of MS Contin  12/6- refusing laxatives- but had BM Saturday and this AM, so con't regimen  12/9- LBM 2 days ago- feels like can go today- will wait on sorbitol until tomorrow  12/10- tiny BM yesterday -refuses additional bowel meds. Explained on pain meds- needs bowel meds 15. Malnutrition: Continue protein supplement.  16. Abdominal pain: Due to diarrhea? PPI resumed-->patient to  have family bring in [place] from home.  17.  Sleep disturbance:   Low dose elavil at bedtime.  Melatonin added on 11/26  Improving overall 18. Leukocytosis:   WBCs 11.6 on 11/26, labs with HD  See #8, #9  Afebrile  12/3- WBC 11.1- afebrile; no Sx's- con't to monitor  12/6- WBC 11.3- cause??  12/7- WBC 12.0- due to abscesses in L thigh, glute, etc causing edema- have contacted Ortho AND ID- waiting to hear back.   12/9- ID is calling IR to get abscesses drained.  12/10- IR doing IR drainage today  19. LE/thigh edema  12/6- will check CT of abd/pelvis with contrast to determine cause-got OK by nephrology to use contrast- has  2-3+ edema of thighs L>R  12/7- due to rim enhancing lesions- as above.   12/8- will con't Ertepenem- per ID.  20. Depression-new-  12/8- will add Celexa 20 mg daily- max DOSE  12/9- had to decrease Duloxetine to 30 mg daily.    LOS: 19 days A FACE TO FACE EVALUATION WAS PERFORMED  Clide Deutscher Blia Totman 02/11/2020, 11:33 AM

## 2020-02-11 NOTE — Progress Notes (Signed)
[facility] NEPHROLOG[facility] PROGRESS NOTE  Assessment/ Plan: Pt is a 54 y.o. yo female with new ESRD, left foot infection is status post a BKA, left hip fracture status post ORIF, now in CIR.  # AoCKD 5,now ESRD: Baseline Creat 4.9- 5.2, eGFR 9-11 from 8/21 - 10/21. Creat 6 on admission, then mid 7's, IVF's given w/o improvementso started HD.Has been clipped to [facility].S/p [facility] and AVF on11/8, 1st HD 11/9. Continue MWF schedule.  Status post dialysis on 12/10 with 3 L UF, tolerated well.  Plan for next HD on 12/13.  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 S/p 4U PRBCs.Tsat low 10%, got 510m Feraheme on 11/6, holding further doses due to infection. IncreasedAranesp 60 to 100 q week on 11/17.  Monitor hemoglobin.  #L foot stump chronic open wound w/ poss osteo by plain films-> s/pL BKA 01/12/20  #L hip fracture - admission diagnosis.S/p ORIF on 10/28 per ortho.S/p I&D surgical  wound 11/18. Currently on IV ertapenem until 12/30 per ID. Found to have left hip abscess status post I&D by Ortho on 12/7.  ID is following.  Status post abscess aspiration by IR on 12/10.  #HTN/ vol -lowered amlodipine dose.  Also on hydralazine, coreg and norvasc.  UF during HD.  #BMM -monitor calcium and phosphorus level.  Continue sevelamer.  PTH 44.   #Hyponatremia, hypervolemic: She has high intradialytic weight gain.  Discussed about fluid restriction.  UF as tolerated.  #Nutrition: albumin very low, on protein supplement.Renal diet w/fluid restrictions.  Subjective: Seen and examined.  No new event.  Denies nausea vomiting chest pain shortness of breath.    Objective Vital signs in last 24 hours: Vitals:   02/10/20 2136 02/11/20 0500 02/11/20 0558 02/11/20 0840  BP: (!) 131/52  (!) 135/50 (!) 135/52  Pulse: (!) 57  (!) 57   Resp:   16   Temp:   97.8 F (36.6 C)   TempSrc:   Oral   SpO2:   98%   Weight:  87.1 kg    Height:       Weight change: -4.1 kg  Intake/Output  Summary (Last 24 hours) at 02/11/2020 1019 Last data filed at 02/11/2020 0755 Gross per 24 hour  Intake 168 ml  Output 3000 ml  Net -2832 ml       Labs: Basic Metabolic Panel: Recent Labs  Lab 02/08/20 1312 02/09/20 0731 02/10/20 1716  NA 124* 130* 131*  K 4.3 3.5 3.6  CL 90* 92* 92*  CO2 _0 GLUCOSE 85 108* 82  BUN 42* 18 34*  CREATININE 6.47* 4.61* 6.56*  CALCIUM 8.4* 8.4* 8.6*  PHOS 5.0* 3.7 4.0   Liver Function Tests: Recent Labs  Lab 02/08/20 1312 02/09/20 0731 02/10/20 1716  ALBUMIN 2.2* 2.2* 2.1*   No results for input(s): LIPASE, AMYLASE in the last 168 hours. No results for input(s): AMMONIA in the last 168 hours. CBC: Recent Labs  Lab 02/06/20 1520 02/08/20 1312 02/10/20 1716  WBC 12.0* 12.1* 11.5*  HGB 10.0* 9.6* 9.4*  HCT 31.5* 31.4* 31.7*  MCV 88.7 89.7 91.4  PLT 287 212 223   Cardiac Enzymes: No results for input(s): CKTOTAL, CKMB, CKMBINDEX, TROPONINI in the last 168 hours. CBG: Recent Labs  Lab 02/10/20 0606 02/10/20 1124 02/10/20 1716 02/10/20 2101 02/11/20 0602  GLUCAP 88 88 101* 177* 105*    Iron Studies: No results for input(s): IRON, TIBC, TRANSFERRIN, FERRITIN in the last 72 hours. Studies/Results: CT ASPIRATION  Result Date: 02/10/2020  INDICATION: Posterior left hip intramuscular abscess EXAM: CT ASPIRATION POSTERIOR LEFT HIP ABSCESS MEDICATIONS: The patient is currently admitted to the hospital and receiving intravenous antibiotics. The antibiotics were administered within an appropriate time frame prior to the initiation of the procedure. ANESTHESIA/SEDATION: Fentanyl 100 mcg IV; Versed 1.0 mg IV Moderate Sedation Time:  10 MINUTES The patient was continuously monitored during the procedure by the interventional radiology nurse under my direct supervision. COMPLICATIONS: None immediate. PROCEDURE: Informed written consent was obtained from the patient after a thorough discussion of the procedural risks, benefits and  alternatives. All questions were addressed. Maximal Sterile Barrier Technique was utilized including caps, mask, sterile gowns, sterile gloves, sterile drape, hand hygiene and skin antiseptic. A timeout was performed prior to the initiation of the procedure. previous imaging reviewed. patient positioned prone. noncontrast localization ct performed. the left posterior hip intramuscular abscess was localized and marked. under sterile conditions and local anesthesia, an 18 gauge 10 cm access was advanced into the small fluid collection. syringe aspiration yielded 40 cc purulent fluid. sample sent for culture. needle removed. patient tolerated the procedure well. no immediate complication. IMPRESSION: Successful CT-guided left hip intramuscular abscess needle aspiration. Sample sent for culture. Electronically Signed   By: Jerilynn Mages.  Shick M.D.   On: 02/10/2020 11:54    Medications: Infusions: . sodium chloride 250 mL (02/10/20 1842)  . ertapenem Stopped (02/10/20 2000)    Scheduled Medications: . (feeding supplement) PROSource Plus  30 mL Oral BID BM  . alosetron  1 mg Oral BID  . amitriptyline  10 mg Oral QHS  . amLODipine  5 mg Oral Daily  . atorvastatin  40 mg Oral q1800  . carvedilol  12.5 mg Oral BID WC  . Chlorhexidine Gluconate Cloth  6 each Topical Q0600  . citalopram  20 mg Oral Daily  . darbepoetin (ARANESP) injection - DIALYSIS  100 mcg Intravenous Q Mon-HD  . DULoxetine  30 mg Oral Daily  . enoxaparin (LOVENOX) injection  30 mg Subcutaneous Q24H  . hydrALAZINE  100 mg Oral TID  . insulin aspart  0-5 Units Subcutaneous QHS  . insulin aspart  0-9 Units Subcutaneous TID WC  . insulin glargine  15 Units Subcutaneous QHS  . melatonin  1.5 mg Oral QHS  . morphine  15 mg Oral Q12H  . multivitamin  1 tablet Oral QHS  . pantoprazole  40 mg Oral Daily  . senna-docusate  1 tablet Oral BID  . sevelamer carbonate  1,600 mg Oral TID WC    have reviewed scheduled and prn medications.  Physical  Exam: General:NAD, comfortable Heart:RRR, s1s2 nl Lungs: Clear b/l, no crackle Abdomen:soft, Non-tender, non-distended Extremities:No LE edema, left BKA Dialysis Access: Right IJ TDC, maturing left AV fistula.  Han Lysne Prasad Daralyn Bert 02/11/2020,10:19 AM  LOS: 19 days  Pager: [phone]

## 2020-02-11 NOTE — Plan of Care (Signed)
  Problem: Consults Goal: RH LIMB LOSS PATIENT EDUCATION Description: Description: See Patient Education module for eduction specifics. 02/11/2020 0104 by Gwendolyn Grant, RN Outcome: Progressing 02/11/2020 0059 by Gwendolyn Grant, RN Outcome: Progressing   Problem: RH SKIN INTEGRITY Goal: RH STG SKIN FREE OF INFECTION/BREAKDOWN Description: With min assist Outcome: Progressing Goal: RH STG MAINTAIN SKIN INTEGRITY WITH ASSISTANCE Description: STG Maintain Skin Integrity With  min Assistance. Outcome: Progressing Goal: RH STG ABLE TO PERFORM INCISION/WOUND CARE W/ASSISTANCE Description: STG Able To Perform Incision/Wound Care With  min Assistance. Outcome: Progressing   Problem: RH SAFETY Goal: RH STG ADHERE TO SAFETY PRECAUTIONS W/ASSISTANCE/DEVICE Description: STG Adhere to Safety Precautions With cues/reminders Assistance/Device. Outcome: Progressing   Problem: RH PAIN MANAGEMENT Goal: RH STG PAIN MANAGED AT OR BELOW PT'S PAIN GOAL Description: Pain at or below level 4 Outcome: Progressing   Problem: RH KNOWLEDGE DEFICIT LIMB LOSS Goal: RH STG INCREASE KNOWLEDGE OF SELF CARE AFTER LIMB LOSS Description: Patient will be able to manage care at discharge and direct others with care needs using handouts and educational information with cues/reminders Outcome: Progressing

## 2020-02-11 NOTE — Progress Notes (Signed)
Occupational Therapy Session Note  Patient Details  Name: [name] MRN: [id number] Date of Birth: September 28, 1965  Today's Date: 02/11/2020 OT Group Treatment Time:  - 1100-1200 OT Group Treatment Time Calculation: 60 min     Skilled Therapeutic Interventions/Progress Updates:    Pt engaged in therapeutic w/c level dance group focusing on patient choice, UE/LE strengthening, salience, activity tolerance, and social participation. Pt was guided through various dance-based exercises involving UEs/LEs and trunk. All music was selected by group members. Individual pt emphasis placed on holistic pain mgt via engagement in meaningful occupation in the social context as well as general strengthening of UB. Pts affect was visibly bright with other group members present. She requested music and participated well, also worked on extension of the Lt knee with limb rest removed. At end of session pt was returned to room by NT.    Therapy Documentation Precautions:  Precautions Precautions: Fall Precaution Comments: new L BKA Required Braces or Orthoses: Other Brace Other Brace: L LE limb guard Restrictions Weight Bearing Restrictions: Yes LLE Weight Bearing: Non weight bearing Pain: pt premedicated for pain   ADL: ADL Upper Body Bathing: Contact guard Lower Body Bathing: Maximal assistance Where Assessed-Lower Body Bathing: Bed level Upper Body Dressing: Minimal assistance Where Assessed-Upper Body Dressing: Edge of bed Lower Body Dressing: Dependent Where Assessed-Lower Body Dressing: Bed level Toileting: Not assessed (incontinent of bowel, did not need to urinate)      Therapy/Group: Group Therapy  Coolidge Gossard A Delray Reza 02/11/2020, 12:41 PM

## 2020-02-12 ENCOUNTER — Inpatient Hospital Stay (HOSPITAL_COMMUNITY): Payer: Medicaid Other | Admitting: Physical Therapy

## 2020-02-12 LAB — GLUCOSE, CAPILLARY
Glucose-Capillary: 115 mg/dL — ABNORMAL HIGH (ref 70–99)
Glucose-Capillary: 130 mg/dL — ABNORMAL HIGH (ref 70–99)
Glucose-Capillary: 141 mg/dL — ABNORMAL HIGH (ref 70–99)
Glucose-Capillary: 102 mg/dL — ABNORMAL HIGH (ref 70–99)

## 2020-02-12 MED ORDER — CHLORHEXIDINE GLUCONATE CLOTH 2 % EX PADS
6.0000 | MEDICATED_PAD | Freq: Every day | CUTANEOUS | Status: DC
  Administered 2020-02-12 – 2020-02-18 (×5): 6 via TOPICAL

## 2020-02-12 MED ORDER — CARVEDILOL 6.25 MG PO TABS
6.2500 mg | ORAL_TABLET | Freq: Two times a day (BID) | ORAL | Status: DC
  Administered 2020-02-12 – 2020-02-21 (×17): 6.25 mg via ORAL
  Filled 2020-02-12 (×17): qty 1

## 2020-02-12 NOTE — Progress Notes (Addendum)
[place] PHYSICAL MEDICINE & REHABILITATION PROGRESS NOTE   Subjective/Complaints: No issues overnight. Left BKA and hip incisions examined and healing very well! States pain is 8/10 Educated regarding elevated CBGs, which foods to avoid and which to eat.   ROS:  Pt denies SOB, abd pain, CP, N/V/C/D, and vision changes  Objective:   CT ASPIRATION  Result Date: 02/10/2020 INDICATION: Posterior left hip intramuscular abscess EXAM: CT ASPIRATION POSTERIOR LEFT HIP ABSCESS MEDICATIONS: The patient is currently admitted to the hospital and receiving intravenous antibiotics. The antibiotics were administered within an appropriate time frame prior to the initiation of the procedure. ANESTHESIA/SEDATION: Fentanyl 100 mcg IV; Versed 1.0 mg IV Moderate Sedation Time:  10 MINUTES The patient was continuously monitored during the procedure by the interventional radiology nurse under my direct supervision. COMPLICATIONS: None immediate. PROCEDURE: Informed written consent was obtained from the patient after a thorough discussion of the procedural risks, benefits and alternatives. All questions were addressed. Maximal Sterile Barrier Technique was utilized including caps, mask, sterile gowns, sterile gloves, sterile drape, hand hygiene and skin antiseptic. A timeout was performed prior to the initiation of the procedure. previous imaging reviewed. patient positioned prone. noncontrast localization ct performed. the left posterior hip intramuscular abscess was localized and marked. under sterile conditions and local anesthesia, an 18 gauge 10 cm access was advanced into the small fluid collection. syringe aspiration yielded 40 cc purulent fluid. sample sent for culture. needle removed. patient tolerated the procedure well. no immediate complication. IMPRESSION: Successful CT-guided left hip intramuscular abscess needle aspiration. Sample sent for culture. Electronically Signed   By: Jerilynn Mages.  Shick M.D.   On:  02/10/2020 11:54   Recent Labs    02/10/20 1716  WBC 11.5*  HGB 9.4*  HCT 31.7*  PLT 223   Recent Labs    02/10/20 1716  NA 131*  K 3.6  CL 92*  CO2 28  GLUCOSE 82  BUN 34*  CREATININE 6.56*  CALCIUM 8.6*    Intake/Output Summary (Last 24 hours) at 02/12/2020 0810 Last data filed at 02/12/2020 0804 Gross per 24 hour  Intake 644 ml  Output --  Net 644 ml     Pressure Injury 01/23/20 Buttocks Left Deep Tissue Pressure Injury - Purple or maroon localized area of discolored intact skin or blood-filled blister due to damage of underlying soft tissue from pressure and/or shear. Circular, dark, red area, surrounde (Active)  01/23/20 1609  Location: Buttocks  Location Orientation: Left  Staging: Deep Tissue Pressure Injury - Purple or maroon localized area of discolored intact skin or blood-filled blister due to damage of underlying soft tissue from pressure and/or shear.  Wound Description (Comments): Circular, dark, red area, surrounded by MASD  Present on Admission: Yes    Physical Exam: Vital Signs Blood pressure (!) 137/54, pulse (!) 55, temperature 98.6 F (37 C), resp. rate 16, height 5\' 10"  (1.778 m), weight 88.2 kg, SpO2 97 %. Gen: no distress, normal appearing HEENT: oral mucosa pink and moist, NCAT Cardio: Bradycardic Chest: normal effort, normal rate of breathing Abd: soft, non-distended Ext: no edema Skin: intact Psych: depressed/flat affect still- no change Skin: Warm and dry.  L BKA-  Well shaped with shrinker.  L hip looks great- sutures out-steristrips-  L thigh with no swelling or drainage Musc: Left BKA with mild edema and tenderness Right lower extremity edema, stable Neuro: Alert Motor: Right lower extremity: Hip flexion, knee extension 4-5/5 (pain inhibition), ankle dorsiflexion 4+/5, stable exam Left lower extremity: Hip flexion,  knee extension 3/5 (some pain inhibition), ankle dorsiflexion 4/5   Assessment/Plan: 1. Functional deficits  which require 3+ hours per day of interdisciplinary therapy in a comprehensive inpatient rehab setting.  Physiatrist is providing close team supervision and 24 hour management of active medical problems listed below.  Physiatrist and rehab team continue to assess barriers to discharge/monitor patient progress toward functional and medical goals  Care Tool:  Bathing    Body parts bathed by patient: Right arm,Left arm,Chest,Abdomen,Face,Right upper leg,Left upper leg,Front perineal area,Right lower leg,Left lower leg   Body parts bathed by helper: Buttocks,Right lower leg Body parts n/a: Left lower leg   Bathing assist Assist Level: Minimal Assistance - Patient > 75%     Upper Body Dressing/Undressing Upper body dressing   What is the patient wearing?: Pull over shirt    Upper body assist Assist Level: Set up assist    Lower Body Dressing/Undressing Lower body dressing      What is the patient wearing?: Pants     Lower body assist Assist for lower body dressing: Minimal Assistance - Patient > 75% (lateral leans EOB)     Toileting Toileting Toileting Activity did not occur (Clothing management and hygiene only): N/A (no void or bm)  Toileting assist Assist for toileting: Maximal Assistance - Patient 25 - 49% Assistive Device Comment: female urinal   Transfers Chair/bed transfer  Transfers assist  Chair/bed transfer activity did not occur: Safety/medical concerns  Chair/bed transfer assist level: Minimal Assistance - Patient > 75% Chair/bed transfer assistive device: Sliding board   Locomotion Ambulation   Ambulation assist   Ambulation activity did not occur: Safety/medical concerns          Walk 10 feet activity   Assist  Walk 10 feet activity did not occur: Safety/medical concerns        Walk 50 feet activity   Assist Walk 50 feet with 2 turns activity did not occur: Safety/medical concerns         Walk 150 feet activity   Assist Walk  150 feet activity did not occur: Safety/medical concerns         Walk 10 feet on uneven surface  activity   Assist Walk 10 feet on uneven surfaces activity did not occur: Safety/medical concerns         Wheelchair     Assist Will patient use wheelchair at discharge?: Yes Type of Wheelchair: Manual    Wheelchair assist level: Supervision/Verbal cueing Max wheelchair distance: 200    Wheelchair 50 feet with 2 turns activity    Assist        Assist Level: Supervision/Verbal cueing   Wheelchair 150 feet activity     Assist      Assist Level: Supervision/Verbal cueing   Medical Problem List and Plan: 1.  Impaired mobility and ADLs secondary to L BKA  Continue CIR 2.  Antithrombotics: -DVT/anticoagulation:  Pharmaceutical: Lovenox             -antiplatelet therapy: N/A 3. Chronic pain/Pain Management: Was on oxycodone 5 mg tid prn as well Cymbalta for neuropathy (at baseline)- now on 10-15 mg q4 hours prn  12/2- will start MS Contin 15 mg BID  12/3-5- MUCH improved pain for pt- con't MS Contin  12/9- pain a little worse today- change flexeril to TID prn  12/12: States pain is 8/10: continue current regimen. .  4. Mood: Team to provide ego support/encouragement.              -  antipsychotic agents: N/A 5. Neuropsych: This patient is capable of making decisions on her own behalf. 6. Skin/Wound Care: Monitor wound daily   Continue stump shrinker   11/30- will call Vascular on Thursday about getting sutures out of L BKA- has been 4 weeks.   12/3- L/M- with vascular  12/5 vascular hasn't seen pt yet--primary team will need to reach out again tomorrow  12/6- will check with vascular if can get staples out-   12/12: Left BKA and hip incisions examined and healing very well! 7. Fluids/Electrolytes/Nutrition: Strict I/O. 1200 cc FR. Will consult dietician to educate on renal diet.  8. Left hip ORIF/hip abscess s/p I&D 11/18: NWB LLE.   Flagyl changed to  Ertepenem due to patient refusal, continue  12/1- on Ertepenem until end of December.  12/7- pt has new rim enhancing lesions in L hip, thigh/glute, etc- have called Ortho and also calling ID to discuss- might have been since switched off Flagyl?   12/9- ID suggested going to IR to get abscesses drained- WBC is still 12.1k- con't Ertepenem  12/10- IR today for draining abscesses  12/11: CBC ordered for Monday.   9. 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 T1DM with neuropathy: Hgb A1c-6.2. Was on Lantus 50 units in am with 10 units novolog tid.     Lantus 38 twice daily, decreased to 10 on 11/27, increased to 15 on 11/28  NovoLog 5 units with breakfast, 8 units with lunch and dinner, decreased to 5 3 times daily on 11/27, DC'd on 11/27.  12/8- Had hypoglycemia x1- of 69- con't regimen for now  12/12: CBGs 101-150: educated patient regarding goal CBGs and benefits of no added sugar in diet.  11. Now ESRD: Now on HD MWF (set up at [facility] post discharge)  12. Anemia of chronic disease/iron deficiency: Hgb-6.3 at admission--->9.1 s/p 4 units PRBC. Feraheme X 1 (additional dose held due to infection) and Aranesp weekly.   Hemoglobin 9.7 on 11/26, repeat CBC ordered for Monday.   Continue to monitor 13. HTN: Monitor BP tid--continue Coreg bid, Norvasc and apresoline tid.   Decreased Coreg to 12.5mg  BID.   24/09: diastolic soft and pulse bradycardic: decrease coreg to 6.25mg  BID.  14. IBS-D:   MiraLAX DC'd  Resumed Lotrenex    Colace as needed ordered  12/2- will make colace scheduled and add sorbitol if no BM by 3pm  12/4 changed colace to senna-s given addn of MS Contin  12/6- refusing laxatives- but had BM Saturday and this AM, so con't regimen  12/9- LBM 2 days ago- feels like can go today- will wait on sorbitol until tomorrow  12/10- tiny BM yesterday -refuses additional bowel meds. Explained on pain meds- needs bowel meds 15.  Malnutrition: Continue protein supplement.  16. Abdominal pain: Due to diarrhea? PPI resumed-->patient to have family bring in Ryan from home.  17.  Sleep disturbance:   Low dose elavil at bedtime.  Melatonin added on 11/26  Improving overall 18. Leukocytosis:   WBCs 11.6 on 11/26, labs with HD  See #8, #9  Afebrile  12/3- WBC 11.1- afebrile; no Sx's- con't to monitor  12/6- WBC 11.3- cause??  12/7- WBC 12.0- due to abscesses in L thigh, glute, etc causing edema- have contacted Ortho AND ID- waiting to hear back.   12/9- ID is calling IR to get abscesses drained.  12/10- IR doing IR drainage today  19. LE/thigh edema  12/6- will check CT of abd/pelvis with contrast to determine cause-got OK by nephrology to use contrast- has 2-3+ edema of thighs L>R  12/7- due to rim enhancing lesions- as above.   12/8- will con't Ertepenem- per ID.  20. Depression-new-  12/8- will add Celexa 20 mg daily- max DOSE  12/9- had to decrease Duloxetine to 30 mg daily.    LOS: 20 days A FACE TO FACE EVALUATION WAS PERFORMED  [name] P [name] 02/12/2020, 8:10 AM

## 2020-02-12 NOTE — Progress Notes (Addendum)
[facility] PROGRESS NOTE  Assessment/ Plan: Pt is a 54 y.o. yo female with new ESRD, left foot infection is status post a BKA, left hip fracture status post ORIF, now in CIR.  # AoCKD 5,now ESRD: Baseline Creat 4.9- 5.2, eGFR 9-11 from 8/21 - 10/21. Creat 6 on admission, then mid 7's, IVF's given w/o improvementso started HD.Has been clipped to [facility].S/p TDC and AVF on11/8, 1st HD 11/9. Continue MWF schedule.  Status post dialysis on 12/10 with 3 L UF, tolerated well.  Plan for next HD on 12/13.  Increase UF goal because of edema.  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 S/p 4U PRBCs.Tsat low 10%, got 510mg Feraheme on 11/6, holding further doses due to infection. IncreasedAranesp 60 to 100 q week on 11/17.  Monitor hemoglobin.  #L foot stump chronic open wound w/ poss osteo by plain films-> s/pL BKA 01/12/20  #L hip fracture - admission diagnosis.S/p ORIF on 10/28 per ortho.S/p I&D surgical  wound 11/18. Currently on IV ertapenem until 12/30 per ID. Found to have left hip abscess status post I&D by Ortho on 12/7.  ID is following.  Status post abscess aspiration by IR on 12/10.  #HTN/ vol -lowered amlodipine dose.  Also on hydralazine, coreg and norvasc.  UF during HD.  #BMM -monitor calcium and phosphorus level.  Continue sevelamer.  PTH 44.   #Hyponatremia, hypervolemic: She has high intradialytic weight gain.  Discussed about fluid restriction.  UF as tolerated.  #Nutrition: albumin very low, on protein supplement.Renal diet w/fluid restrictions.  Subjective: Seen and examined.  Doing well.  Denies nausea vomiting chest pain shortness of breath. Objective Vital signs in last 24 hours: Vitals:   02/11/20 1330 02/11/20 2102 02/12/20 0530 02/12/20 0735  BP: (!) 142/51 (!) 140/53 (!) 137/57 (!) 137/54  Pulse: 61 (!) 59 (!) 57 (!) 55  Resp: 17 18 16   Temp: 98.9 F (37.2 C) 98.8 F (37.1 C) 98.6 F (37 C)   TempSrc: Oral     SpO2: 96%  96% 97%   Weight:   88.2 kg   Height:       Weight change: 1.6 kg  Intake/Output Summary (Last 24 hours) at 02/12/2020 0934 Last data filed at 02/12/2020 0804 Gross per 24 hour  Intake 644 ml  Output --  Net 644 ml       Labs: Basic Metabolic Panel: Recent Labs  Lab 02/08/20 1312 02/09/20 0731 02/10/20 1716  NA 124* 130* 131*  K 4.3 3.5 3.6  CL 90* 92* 92*  CO2 24 28 28  GLUCOSE 85 108* 82  BUN 42* 18 34*  CREATININE 6.47* 4.61* 6.56*  CALCIUM 8.4* 8.4* 8.6*  PHOS 5.0* 3.7 4.0   Liver Function Tests: Recent Labs  Lab 02/08/20 1312 02/09/20 0731 02/10/20 1716  ALBUMIN 2.2* 2.2* 2.1*   No results for input(s): LIPASE, AMYLASE in the last 168 hours. No results for input(s): AMMONIA in the last 168 hours. CBC: Recent Labs  Lab 02/06/20 1520 02/08/20 1312 02/10/20 1716  WBC 12.0* 12.1* 11.5*  HGB 10.0* 9.6* 9.4*  HCT 31.5* 31.4* 31.7*  MCV 88.7 89.7 91.4  PLT 287 212 223   Cardiac Enzymes: No results for input(s): CKTOTAL, CKMB, CKMBINDEX, TROPONINI in the last 168 hours. CBG: Recent Labs  Lab 02/11/20 0602 02/11/20 1210 02/11/20 1646 02/11/20 2121 02/12/20 0616  GLUCAP 105* 101* 131* 150* 115*    Iron Studies: No results for input(s): IRON, TIBC, TRANSFERRIN, FERRITIN in the   last 72 hours. Studies/Results: CT ASPIRATION  Result Date: 02/10/2020 INDICATION: Posterior left hip intramuscular abscess EXAM: CT ASPIRATION POSTERIOR LEFT HIP ABSCESS MEDICATIONS: The patient is currently admitted to the hospital and receiving intravenous antibiotics. The antibiotics were administered within an appropriate time frame prior to the initiation of the procedure. ANESTHESIA/SEDATION: Fentanyl 100 mcg IV; Versed 1.0 mg IV Moderate Sedation Time:  10 MINUTES The patient was continuously monitored during the procedure by the interventional radiology nurse under my direct supervision. COMPLICATIONS: None immediate. PROCEDURE: Informed written consent was obtained  from the patient after a thorough discussion of the procedural risks, benefits and alternatives. All questions were addressed. Maximal Sterile Barrier Technique was utilized including caps, mask, sterile gowns, sterile gloves, sterile drape, hand hygiene and skin antiseptic. A timeout was performed prior to the initiation of the procedure. previous imaging reviewed. patient positioned prone. noncontrast localization ct performed. the left posterior hip intramuscular abscess was localized and marked. under sterile conditions and local anesthesia, an 18 gauge 10 cm access was advanced into the small fluid collection. syringe aspiration yielded 40 cc purulent fluid. sample sent for culture. needle removed. patient tolerated the procedure well. no immediate complication. IMPRESSION: Successful CT-guided left hip intramuscular abscess needle aspiration. Sample sent for culture. Electronically Signed   By: M.  Shick M.D.   On: 02/10/2020 11:54    Medications: Infusions: . sodium chloride 250 mL (02/10/20 1842)  . ertapenem Stopped (02/11/20 2200)    Scheduled Medications: . (feeding supplement) PROSource Plus  30 mL Oral BID BM  . alosetron  1 mg Oral BID  . amitriptyline  10 mg Oral QHS  . amLODipine  5 mg Oral Daily  . atorvastatin  40 mg Oral q1800  . carvedilol  6.25 mg Oral BID WC  . Chlorhexidine Gluconate Cloth  6 each Topical Q0600  . citalopram  20 mg Oral Daily  . darbepoetin (ARANESP) injection - DIALYSIS  100 mcg Intravenous Q Mon-HD  . DULoxetine  30 mg Oral Daily  . enoxaparin (LOVENOX) injection  30 mg Subcutaneous Q24H  . hydrALAZINE  100 mg Oral TID  . insulin aspart  0-5 Units Subcutaneous QHS  . insulin aspart  0-9 Units Subcutaneous TID WC  . insulin glargine  15 Units Subcutaneous QHS  . melatonin  1.5 mg Oral QHS  . morphine  15 mg Oral Q12H  . multivitamin  1 tablet Oral QHS  . pantoprazole  40 mg Oral Daily  . senna-docusate  1 tablet Oral BID  . sevelamer carbonate   1,600 mg Oral TID WC    have reviewed scheduled and prn medications.  Physical Exam: General: Not in distress, comfortable Heart:RRR, s1s2 nl Lungs: Clear b/l, no crackle Abdomen:soft, Non-tender, non-distended Extremities: RLE edema+, left BKA Dialysis Access: Right IJ TDC, maturing left AV fistula.  Dron Prasad Bhandari 02/12/2020,9:34 AM  LOS: 20 days  Pager: [phone]  

## 2020-02-12 NOTE — Progress Notes (Signed)
Physical Therapy Session Note  Patient Details  Name: [name] MRN: [id number] Date of Birth: 1965/09/23  Today's Date: 02/12/2020 PT Individual Time: 0910-0955 PT Individual Time Calculation (min): 45 min   Short Term Goals: Week 3:  PT Short Term Goal 1 (Week 3): Pt to initiate gait training in parallel bars with mod A PT Short Term Goal 2 (Week 3): Pt to perform sit to stand consistently with Mod A x1 PT Short Term Goal 3 (Week 3): Patient will demonstrate standing balance with CGA x 2 minutes with 1-2 UE support in prep for standing ADL PT Short Term Goal 4 (Week 3): Pt will lateral scoot left and right independent on EOB without cuing for head hips relationship  Skilled Therapeutic Interventions/Progress Updates:    Pt received seated in bed, agreeable to PT session. Pt reports soreness in LLE at rest, premedicated prior to start of therapy session. Bed mobility Supervision with use of bedrails and HOB elevated. Assisted pt with donning L residual limb guard while seated EOB. Slide board transfer bed to w/c with setup A for board placement. Manual w/c propulsion 2 x 500 ft at Supervision level with use of BUE for global endurance training. Pt able to navigate hallways and up/down inclines at Supervision level. Pt requests to return to bed at end of session. Pt requires assist for management of w/c parts. Slide board transfer with setup A for board placement w/c to bed. Pt returns to supine at Supervision level with use of bedrails. Pt left semi-reclined in bed with needs in reach, bed alarm in place at end of session.  Therapy Documentation Precautions:  Precautions Precautions: Fall Precaution Comments: new L BKA Required Braces or Orthoses: Other Brace Other Brace: L LE limb guard Restrictions Weight Bearing Restrictions: Yes LLE Weight Bearing: Non weight bearing    Therapy/Group: Individual Therapy   Excell Seltzer, PT, DPT  02/12/2020, 12:42 PM

## 2020-02-13 ENCOUNTER — Inpatient Hospital Stay (HOSPITAL_COMMUNITY): Payer: Medicaid Other | Admitting: Occupational Therapy

## 2020-02-13 ENCOUNTER — Inpatient Hospital Stay (HOSPITAL_COMMUNITY): Payer: Medicaid Other

## 2020-02-13 DIAGNOSIS — M86172 Other acute osteomyelitis, left ankle and foot: Secondary | ICD-10-CM

## 2020-02-13 DIAGNOSIS — L0291 Cutaneous abscess, unspecified: Secondary | ICD-10-CM

## 2020-02-13 LAB — GLUCOSE, CAPILLARY
Glucose-Capillary: 91 mg/dL (ref 70–99)
Glucose-Capillary: 106 mg/dL — ABNORMAL HIGH (ref 70–99)
Glucose-Capillary: 112 mg/dL — ABNORMAL HIGH (ref 70–99)
Glucose-Capillary: 192 mg/dL — ABNORMAL HIGH (ref 70–99)

## 2020-02-13 LAB — CBC
HCT: 34.1 % — ABNORMAL LOW (ref 36.0–46.0)
Hemoglobin: 10.1 g/dL — ABNORMAL LOW (ref 12.0–15.0)
MCH: 26.6 pg (ref 26.0–34.0)
MCHC: 29.6 g/dL — ABNORMAL LOW (ref 30.0–36.0)
MCV: 90 fL (ref 80.0–100.0)
Platelets: 251 10*3/uL (ref 150–400)
RBC: 3.79 MIL/uL — ABNORMAL LOW (ref 3.87–5.11)
RDW: 15.9 % — ABNORMAL HIGH (ref 11.5–15.5)
WBC: 10.4 10*3/uL (ref 4.0–10.5)
nRBC: 0 % (ref 0.0–0.2)

## 2020-02-13 LAB — BASIC METABOLIC PANEL
Anion gap: 9 (ref 5–15)
BUN: 43 mg/dL — ABNORMAL HIGH (ref 6–20)
CO2: 28 mmol/L (ref 22–32)
Calcium: 9.1 mg/dL (ref 8.9–10.3)
Chloride: 92 mmol/L — ABNORMAL LOW (ref 98–111)
Creatinine, Ser: 7.18 mg/dL — ABNORMAL HIGH (ref 0.44–1.00)
GFR, Estimated: 6 mL/min — ABNORMAL LOW (ref >60)
Glucose, Bld: 90 mg/dL (ref 70–99)
Potassium: 4.7 mmol/L (ref 3.5–5.1)
Sodium: 129 mmol/L — ABNORMAL LOW (ref 135–145)

## 2020-02-13 LAB — HEPATITIS B SURFACE ANTIGEN: Hepatitis B Surface Ag: NONREACTIVE

## 2020-02-13 MED ORDER — HEPARIN SODIUM (PORCINE) 1000 UNIT/ML IJ SOLN
1000.0000 [IU] | INTRAMUSCULAR | Status: DC
  Administered 2020-02-20: 1000 [IU] via INTRAVENOUS
  Filled 2020-02-13 (×3): qty 1

## 2020-02-13 MED ORDER — DARBEPOETIN ALFA 100 MCG/0.5ML IJ SOSY
PREFILLED_SYRINGE | INTRAMUSCULAR | Status: AC
  Filled 2020-02-13: qty 0.5

## 2020-02-13 MED ORDER — HEPARIN SODIUM (PORCINE) 1000 UNIT/ML IJ SOLN
INTRAMUSCULAR | Status: AC
  Administered 2020-02-13: 3200 [IU] via INTRAVENOUS
  Filled 2020-02-13: qty 4

## 2020-02-13 NOTE — Progress Notes (Signed)
[facility]  Date of Admission:  01/23/2020           Current antibiotics: Ertapenem  Reason for visit: Follow up on Enterobacter cloacae and Bacteroides wound infection  Interval events: No acute events over the weekend Status post IR aspiration on 12/10 Cultures negative to date WBC improved Afebrile  ASSESSMENT:    #Hip fracture status post intramedullary fixation 16/10  #Complicated by wound infection with Enterobacter cloacae and Bacteroides.  Status post I&D 11/18.  Currently on antibiotics for planned 6-week course through 96/04  #Further complicated by abscess in the left hip status post IR aspiration 12/10 with 40 cc of purulent fluid.  Cultures no growth to date.  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 (feeding supplement) PROSource Plus  30 mL Oral BID BM  . alosetron  1 mg Oral BID  . amitriptyline  10 mg Oral QHS  . amLODipine  5 mg Oral Daily  . atorvastatin  40 mg Oral q1800  . carvedilol  6.25 mg Oral BID WC  . Chlorhexidine Gluconate Cloth  6 each Topical Q0600  . Chlorhexidine Gluconate Cloth  6 each Topical Q0600  . citalopram  20 mg Oral Daily  . darbepoetin (ARANESP) injection - DIALYSIS  100 mcg Intravenous Q Mon-HD  . DULoxetine  30 mg Oral Daily  . enoxaparin (LOVENOX) injection  30 mg Subcutaneous Q24H  . hydrALAZINE  100 mg Oral TID  . insulin aspart  0-5 Units Subcutaneous QHS  . insulin aspart  0-9 Units Subcutaneous TID WC  . insulin glargine  15 Units Subcutaneous QHS  . melatonin  1.5 mg Oral QHS  . morphine  15 mg Oral Q12H  . multivitamin  1 tablet Oral QHS  . pantoprazole  40 mg Oral Daily  . sevelamer carbonate  1,600 mg Oral TID WC    Continuous Infusions: . sodium chloride 250 mL (02/10/20 1842)  . ertapenem Stopped (02/12/20 1836)    PRN Meds: sodium chloride, acetaminophen, bisacodyl,  calcium carbonate, cyclobenzaprine, diphenhydrAMINE, guaiFENesin-dextromethorphan, lip balm, loratadine, Muscle Rub, oxyCODONE, [DISCONTINUED] menthol-cetylpyridinium **OR** phenol, polyvinyl alcohol, prochlorperazine **OR** prochlorperazine **OR** prochlorperazine, sodium chloride, traMADol  SUBJECTIVE:   No new complaints today.  No fevers or chills.  Working with physical therapy.    OBJECTIVE:   Allergies  Allergen Reactions  . Trazodone Swelling  . Latex Rash  . Lidocaine Itching    Blood pressure (!) 138/54, pulse (!) 57, temperature 98.1 F (36.7 C), temperature source Oral, resp. rate 18, height 5\' 10"  (1.778 m), weight 89.1 kg, SpO2 99 %. Body mass index is 28.19 kg/m.  Physical Exam Constitutional:      Comments: Lying in bed, no acute distress, working with physical therapy.  HENT:     Head: Normocephalic and atraumatic.  Musculoskeletal:     Comments: Status post left BKA.  Neurological:     General: No focal deficit present.     Mental Status: She is oriented to person, place, and time.  Psychiatric:        Mood and Affect: Mood normal.        Behavior: Behavior normal.      Lab Results & Microbiology Lab Results  Component Value Date   WBC 10.4 02/13/2020   HGB 10.1 (L) 02/13/2020   HCT 34.1 (L) 02/13/2020   MCV 90.0 02/13/2020   PLT  251 02/13/2020    Lab Results  Component Value Date   NA 129 (L) 02/13/2020   K 4.7 02/13/2020   CO2 28 02/13/2020   GLUCOSE 90 02/13/2020   BUN 43 (H) 02/13/2020   CREATININE 7.18 (H) 02/13/2020   CALCIUM 9.1 02/13/2020   GFRNONAA 6 (L) 02/13/2020   GFRAA 13 (L) 12/17/2018    Lab Results  Component Value Date   ALT 6 01/07/2020   AST 21 01/07/2020   ALKPHOS 223 (H) 01/07/2020   BILITOT 0.7 01/07/2020     I have reviewed the micro and lab results in Epic.  Imaging No results found.     [name] for Infectious Disease [facility] [phone] pager 02/13/2020,  9:45 AM

## 2020-02-13 NOTE — Progress Notes (Signed)
PHYSICAL MEDICINE & REHABILITATION PROGRESS NOTE   Subjective/Complaints:  Had BMs Saturday and Sunday- still too loose- refusing laxative.   L thigh is less swollen/less painful.     ROS:   Pt denies SOB, abd pain, CP, N/V/C/D, and vision changes   Objective:   No results found. Recent Labs    02/10/20 1716 02/13/20 0632  WBC 11.5* 10.4  HGB 9.4* 10.1*  HCT 31.7* 34.1*  PLT 223 251   Recent Labs    02/10/20 1716 02/13/20 0632  NA 131* 129*  K 3.6 4.7  CL 92* 92*  CO2 28 28  GLUCOSE 82 90  BUN 34* 43*  CREATININE 6.56* 7.18*  CALCIUM 8.6* 9.1    Intake/Output Summary (Last 24 hours) at 02/13/2020 0845 Last data filed at 02/13/2020 0700 Gross per 24 hour  Intake 816 ml  Output --  Net 816 ml     Pressure Injury 01/23/20 Buttocks Left Deep Tissue Pressure Injury - Purple or maroon localized area of discolored intact skin or blood-filled blister due to damage of underlying soft tissue from pressure and/or shear. Circular, dark, red area, surrounde (Active)  01/23/20 1609  Location: Buttocks  Location Orientation: Left  Staging: Deep Tissue Pressure Injury - Purple or maroon localized area of discolored intact skin or blood-filled blister due to damage of underlying soft tissue from pressure and/or shear.  Wound Description (Comments): Circular, dark, red area, surrounded by MASD  Present on Admission: Yes    Physical Exam: Vital Signs Blood pressure (!) 138/54, pulse (!) 57, temperature 98.1 F (36.7 C), temperature source Oral, resp. rate 18, height 5\' 10"  (1.778 m), weight 89.1 kg, SpO2 99 %. Gen: sitting up in bed; appropriate, but flat affect, RN in room giving meds, NAD HEENT: oral mucosa pink and moist, NCAT Cardio: bradycardic; regular rhythm Chest: CTA B/L- no W/R/R- good air movement Abd: Soft, NT, ND, (+)BS  Ext: no edema Skin: intact Psych: depressed flat affect Skin: Warm and dry.  L BKA-  Well shaped with shrinker.  L hip  looks great- sutures out-steristrips-  L thigh with no swelling or drainage Musc: Left BKA with mild edema and tenderness LLE less swollen/edematous-  Neuro: Alert Motor: Right lower extremity: Hip flexion, knee extension 4-5/5 (pain inhibition), ankle dorsiflexion 4+/5, stable exam Left lower extremity: Hip flexion, knee extension 3/5 (some pain inhibition), ankle dorsiflexion 4/5   Assessment/Plan: 1. Functional deficits which require 3+ hours per day of interdisciplinary therapy in a comprehensive inpatient rehab setting.  Physiatrist is providing close team supervision and 24 hour management of active medical problems listed below.  Physiatrist and rehab team continue to assess barriers to discharge/monitor patient progress toward functional and medical goals  Care Tool:  Bathing    Body parts bathed by patient: Right arm,Left arm,Chest,Abdomen,Face,Right upper leg,Left upper leg,Front perineal area,Right lower leg,Left lower leg   Body parts bathed by helper: Buttocks,Right lower leg Body parts n/a: Left lower leg   Bathing assist Assist Level: Minimal Assistance - Patient > 75%     Upper Body Dressing/Undressing Upper body dressing   What is the patient wearing?: Pull over shirt    Upper body assist Assist Level: Set up assist    Lower Body Dressing/Undressing Lower body dressing      What is the patient wearing?: Pants     Lower body assist Assist for lower body dressing: Minimal Assistance - Patient > 75% (lateral leans EOB)     Toileting Toileting Toileting  Activity did not occur (Clothing management and hygiene only): N/A (no void or bm)  Toileting assist Assist for toileting: Maximal Assistance - Patient 25 - 49% Assistive Device Comment: female urinal   Transfers Chair/bed transfer  Transfers assist  Chair/bed transfer activity did not occur: Safety/medical concerns  Chair/bed transfer assist level: Set up assist Chair/bed transfer assistive  device: Sliding board   Locomotion Ambulation   Ambulation assist   Ambulation activity did not occur: Safety/medical concerns          Walk 10 feet activity   Assist  Walk 10 feet activity did not occur: Safety/medical concerns        Walk 50 feet activity   Assist Walk 50 feet with 2 turns activity did not occur: Safety/medical concerns         Walk 150 feet activity   Assist Walk 150 feet activity did not occur: Safety/medical concerns         Walk 10 feet on uneven surface  activity   Assist Walk 10 feet on uneven surfaces activity did not occur: Safety/medical concerns         Wheelchair     Assist Will patient use wheelchair at discharge?: Yes Type of Wheelchair: Manual    Wheelchair assist level: Supervision/Verbal cueing Max wheelchair distance: 500'    Wheelchair 50 feet with 2 turns activity    Assist        Assist Level: Supervision/Verbal cueing   Wheelchair 150 feet activity     Assist      Assist Level: Supervision/Verbal cueing   Medical Problem List and Plan: 1.  Impaired mobility and ADLs secondary to L BKA  Continue CIR 2.  Antithrombotics: -DVT/anticoagulation:  Pharmaceutical: Lovenox             -antiplatelet therapy: N/A 3. Chronic pain/Pain Management: Was on oxycodone 5 mg tid prn as well Cymbalta for neuropathy (at baseline)- now on 10-15 mg q4 hours prn  12/2- will start MS Contin 15 mg BID  12/3-5- MUCH improved pain for pt- con't MS Contin  12/9- pain a little worse today- change flexeril to TID prn  12/12: States pain is 8/10: continue current regimen.  12/13- pt reports pain is doing better due to L swelling improving.  4. Mood: Team to provide ego support/encouragement.              -antipsychotic agents: N/A 5. Neuropsych: This patient is capable of making decisions on her own behalf. 6. Skin/Wound Care: Monitor wound daily   Continue stump shrinker   11/30- will call Vascular on  Thursday about getting sutures out of L BKA- has been 4 weeks.   12/3- L/M- with vascular  12/5 vascular hasn't seen pt yet--primary team will need to reach out again tomorrow  12/6- will check with vascular if can get staples out-   12/12: Left BKA and hip incisions examined and healing very well! 7. Fluids/Electrolytes/Nutrition: Strict I/O. 1200 cc FR. Will consult dietician to educate on renal diet.  8. Left hip ORIF/hip abscess s/p I&D 11/18: NWB LLE.   Flagyl changed to Ertepenem due to patient refusal, continue  12/1- on Ertepenem until end of December.  12/7- pt has new rim enhancing lesions in L hip, thigh/glute, etc- have called Ortho and also calling ID to discuss- might have been since switched off Flagyl?   12/9- ID suggested going to IR to get abscesses drained- WBC is still 12.1k- con't Ertepenem  12/10- IR today for  draining abscesses  12/11: CBC ordered for Monday.   12/13- WBC down to 10.4  9. 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 T1DM with neuropathy: Hgb A1c-6.2. Was on Lantus 50 units in am with 10 units novolog tid.     Lantus 38 twice daily, decreased to 10 on 11/27, increased to 15 on 11/28  NovoLog 5 units with breakfast, 8 units with lunch and dinner, decreased to 5 3 times daily on 11/27, DC'd on 11/27.  12/8- Had hypoglycemia x1- of 69- con't regimen for now  12/12: CBGs 101-150: educated patient regarding goal CBGs and benefits of no added sugar in diet.  12/13- BGs 91-141- con't regimen  11. Now ESRD: Now on HD MWF (set up at [facility] post discharge)  12. Anemia of chronic disease/iron deficiency: Hgb-6.3 at admission--->9.1 s/p 4 units PRBC. Feraheme X 1 (additional dose held due to infection) and Aranesp weekly.   Hemoglobin 9.7 on 11/26, repeat CBC ordered for Monday.   12/13- Hb 10.1- improving  Continue to monitor 13. HTN: Monitor BP tid--continue Coreg bid, Norvasc and apresoline tid.   Decreased  Coreg to 12.5mg  BID.   89/37: diastolic soft and pulse bradycardic: decrease coreg to 6.25mg  BID.  14. IBS-D:   MiraLAX DC'd  Resumed Lotrenex    Colace as needed ordered  12/2- will make colace scheduled and add sorbitol if no BM by 3pm  12/4 changed colace to senna-s given addn of MS Contin  12/6- refusing laxatives- but had BM Saturday and this AM, so con't regimen  12/9- LBM 2 days ago- feels like can go today- will wait on sorbitol until tomorrow  12/10- tiny BM yesterday -refuses additional bowel meds. Explained on pain meds- needs bowel meds  12/13- BMs x2 over weekend- insisted we stop laxatives- won't take.  15. Malnutrition: Continue protein supplement.  16. Abdominal pain: Due to diarrhea? PPI resumed-->patient to have family bring in [place] from home.  17.  Sleep disturbance:   Low dose elavil at bedtime.  Melatonin added on 11/26  Improving overall 18. Leukocytosis:   WBCs 11.6 on 11/26, labs with HD  See #8, #9  Afebrile  12/3- WBC 11.1- afebrile; no Sx's- con't to monitor  12/6- WBC 11.3- cause??  12/7- WBC 12.0- due to abscesses in L thigh, glute, etc causing edema- have contacted Ortho AND ID- waiting to hear back.   12/9- ID is calling IR to get abscesses drained.  12/10- IR doing IR drainage today  19. LE/thigh edema  12/6- will check CT of abd/pelvis with contrast to determine cause-got OK by nephrology to use contrast- has 2-3+ edema of thighs L>R  12/7- due to rim enhancing lesions- as above.   12/8- will con't Ertepenem- per ID.   12/13- s/p IR- WBC 10.4- con't Ertepenem 20. Depression-new-  12/8- will add Celexa 20 mg daily- max DOSE  12/9- had to decrease Duloxetine to 30 mg daily.    LOS: 21 days A FACE TO FACE EVALUATION WAS PERFORMED  Daiton Cowles 02/13/2020, 8:45 AM

## 2020-02-13 NOTE — Procedures (Signed)
   I was present at this dialysis session, have reviewed the session itself and made  appropriate changes [name] MD [place] pager [phone]   02/13/2020, 4:26 PM

## 2020-02-13 NOTE — Progress Notes (Signed)
Occupational Therapy Session Note  Patient Details  Name: [name] MRN: [id number] Date of Birth: 18-Aug-1965  Today's Date: 02/13/2020 OT Individual Time: 5643-3295 OT Individual Time Calculation (min): 73 min    Short Term Goals: Week 3:  OT Short Term Goal 1 (Week 3): Pt will perform sit to stand Mod A in prep for LB ADL OT Short Term Goal 2 (Week 3): Pt will perform clothing management with lateral leans on BSC with no more than Mod A OT Short Term Goal 3 (Week 3): Pt will perform BSC transfers with supervision OT Short Term Goal 4 (Week 3): Pt will perform 10 minute ADL/activity of choice without fatigue or rest break   Skilled Therapeutic Interventions/Progress Updates:    Pt greeted at time of session reclined in bed resting agreeable to OT session, no c/o pain initially but did have some hip discomfort later in session, rest breaks PRN. Supine to sit EOB Supervision with bed features, pt did not have on pants this am but brief only, slide board bed > w/c CGA/Min d/t friction but was also training for pt to see difficulty with slide board without shorts/pants at home. Self propel to sink Supervision and set up/supervision for UB bathe and dress, oral hygiene, and brushing hair. Pt wanting to wash hair, assisted with hair washing at sink level for comfort and education on how to wash hair at home in this manner as she is not cleared to shower. Pt assisting with drying hair from w/c level. Pt still without pants, slide board back to bed with towel for improved performance, performed LB dress EOB with lateral leans Supervision. Discussion and therapist demonstration for drop amr BSC use at this will likely be largest obstacle as pt has difficulty with lateral leans on BSC and needs assist with hygiene, cannot clear buttocks. Reviewed techniques to improve comfort when sitting on BSC for towels and option to purchase additional padded seat if needed. Pt sit to supine Supervision, alarm on  call bell in reach.     Therapy Documentation Precautions:  Precautions Precautions: Fall Precaution Comments: new L BKA Required Braces or Orthoses: Other Brace Other Brace: L LE limb guard Restrictions Weight Bearing Restrictions: Yes LLE Weight Bearing: Non weight bearing     Therapy/Group: Individual Therapy  [name] 02/13/2020, 8:32 AM

## 2020-02-13 NOTE — Progress Notes (Signed)
Patient ID: [name], female   DOB: 1965-07-17, 54 y.o.   MRN: [id number] Gave pt ramp information to pursue and look into. She reports the landlord has said he would do a ramp. Have encouraged pt to get on this due to she will need to go to HD three times per week and will need to be able to get out of her home.

## 2020-02-13 NOTE — Progress Notes (Signed)
Physical Therapy Session Note  Patient Details  Name: [name] MRN: [id number] Date of Birth: 07-21-1965  Today's Date: 02/13/2020 PT Individual Time:Session1:  4098-1191; Session2: 1040-1130 PT Individual Time Calculation (min): 70 min & 50 min  Short Term Goals: Week 3:  PT Short Term Goal 1 (Week 3): Pt to initiate gait training in parallel bars with mod A PT Short Term Goal 2 (Week 3): Pt to perform sit to stand consistently with Mod A x1 PT Short Term Goal 3 (Week 3): Patient will demonstrate standing balance with CGA x 2 minutes with 1-2 UE support in prep for standing ADL PT Short Term Goal 4 (Week 3): Pt will lateral scoot left and right independent on EOB without cuing for head hips relationship  Skilled Therapeutic Interventions/Progress Updates:    Session1: Patient in supine, reports just got pain meds 20 minutes prior and having 10/10 pain.  Performed in bed therex: L quad sets, SAQ, hip adductor sets, hip abduction, bridging over bolster.  Patient supine to sit with S using rails.  Slide board transfer to w/c with S.  Assist for legrests.  Patient propelled x 120' to practice car transfer with mod A after board placement.  Patient propelled to therapy gym 120' with S.  Sit to stand in parallel bars mod cues and mod A using momentum.  Performed x 3 reps.  Standing balance with bilateral UE support and min to CGA x 2 minutes.  Patient pushed in w/c to room x 150' and transferred to bed S.  Education regarding placement of chair when at home as well as w/c parts for accessibility but not blocking her access to the bed.  Patient seated for using zoom ball for balance reactions, lateral stretches leaning and reaching overhead x 5 each side.  Patient to supine with S and left with all needs in reach.  [place]:  Patient seen with Deatra Ina seating specialist from [place] for wheelchair evaluation.  Patient supine to sit with S and transferred with slide board to w/c with S.   Patient propelled to rehab gym and transferred to mat with S after w/c set up.  Mr. Alean Rinne took measurements while on mat.  Obtained 18x16 w/c to see if pt could fit since will be the only way to access her bedroom due to door width.  She transferred with S and propelled short distance in the gym and noted she felt comfortable in 18" wide w/c.  Discussed options for seat height, etc and process for ordering, etc with Mr. Ronne Binning.  Patient transferred back to mat and then to 20" chair with S using board and A for w/c set up. Patient propelled to her room mod I and transferred to bed with S.  Left in bed all needs in reach and bed alarm set.   Therapy Documentation Precautions:  Precautions Precautions: Fall Precaution Comments: new L BKA Required Braces or Orthoses: Other Brace Other Brace: L LE limb guard Restrictions Weight Bearing Restrictions: Yes LLE Weight Bearing: Non weight bearing Pain: Pain Assessment Pain Scale: 0-10 Pain Score: 10-Worst pain ever Pain Type: Acute pain Pain Location: Hip Pain Orientation: Left;Posterior Pain Descriptors / Indicators: Aching Pain Frequency: Intermittent Pain Onset: With Activity Patients Stated Pain Goal: 2 Pain Intervention(s): Repositioned;Rest    Therapy/Group: Individual Therapy  Reginia Naas  [place], PT 02/13/2020, 12:09 PM

## 2020-02-14 ENCOUNTER — Other Ambulatory Visit: Payer: Self-pay | Admitting: Internal Medicine

## 2020-02-14 ENCOUNTER — Inpatient Hospital Stay (HOSPITAL_COMMUNITY): Payer: Medicaid Other | Admitting: Occupational Therapy

## 2020-02-14 ENCOUNTER — Inpatient Hospital Stay (HOSPITAL_COMMUNITY): Payer: Medicaid Other

## 2020-02-14 DIAGNOSIS — L0291 Cutaneous abscess, unspecified: Secondary | ICD-10-CM

## 2020-02-14 DIAGNOSIS — L02416 Cutaneous abscess of left lower limb: Secondary | ICD-10-CM

## 2020-02-14 LAB — C-REACTIVE PROTEIN: CRP: 2.6 mg/dL — ABNORMAL HIGH (ref <1.0)

## 2020-02-14 LAB — GLUCOSE, CAPILLARY
Glucose-Capillary: 113 mg/dL — ABNORMAL HIGH (ref 70–99)
Glucose-Capillary: 100 mg/dL — ABNORMAL HIGH (ref 70–99)
Glucose-Capillary: 140 mg/dL — ABNORMAL HIGH (ref 70–99)
Glucose-Capillary: 133 mg/dL — ABNORMAL HIGH (ref 70–99)

## 2020-02-14 LAB — SEDIMENTATION RATE: Sed Rate: 32 mm/hr — ABNORMAL HIGH (ref 0–22)

## 2020-02-14 MED ORDER — SODIUM CHLORIDE 0.9 % IV SOLN: 500.0000 mg | INTRAVENOUS | 0 refills | Status: DC

## 2020-02-14 NOTE — Progress Notes (Signed)
Patient ID: [name], female   DOB: 09/03/1965, 54 y.o.   MRN: [id number]  Met with pt to discuss team conference progress toward her goals and discharge still 12/21. Will need daughter to come in for education prior to discharge Tuesday. She will check with her and let worker know day and time can come in. Discussed again about the ramp that is needed prior to her leaving. She is working on it.

## 2020-02-14 NOTE — Progress Notes (Signed)
Occupational Therapy Session Note  Patient Details  Name: [name] MRN: [id number] Date of Birth: 09-21-1965  Today's Date: 02/14/2020 OT Individual Time: 7124-5809 and 9833-8250 OT Individual Time Calculation (min): 56 min and 74 min   Short Term Goals: Week 3:  OT Short Term Goal 1 (Week 3): Pt will perform sit to stand Mod A in prep for LB ADL OT Short Term Goal 2 (Week 3): Pt will perform clothing management with lateral leans on BSC with no more than Mod A OT Short Term Goal 3 (Week 3): Pt will perform BSC transfers with supervision OT Short Term Goal 4 (Week 3): Pt will perform 10 minute ADL/activity of choice without fatigue or rest break   Skilled Therapeutic Interventions/Progress Updates:    Pt greeted at time of session supine in bed resting finishing up eating breakfast, c/o L hip pain approx 7/10 and RN aware with plans to provide pain meds later in session. Supine to sit Supervision, slide board bed > w/c with pt performing board and w/c management with Min A. Set up at sink with cues to lock brakes to perform oral hygiene and grooming, declined further ADL. Self propel to shower room and therapist demonstrated tub transfer as pt is unsure which room she will have at home and which bathroom set up (walk in vs tub/shower) but demonstrated TTB transfer and sequencing with cues for safety when pt is cleared to shower. Self propel back to room with focus on pt setting up own wheelchair close to bed surface, sequencing when to remove leg rests/armrests, and placement of slide board. Continued to verbally problem solve through toileting situation at home, pt does not want to try bariatric drop arm BSC as it will not be covered by insurance but continues to state that regular drop arm is uncomfortable to sit on. Slide board w/c > bed Supervision/CGA and sit to supine same manner. Alarm on call bell in reach.  Session 2: Pt greeted at time of session supine in bed resting agreeable  to OT session, mild discomfort in hip but premedicated and did not interfere with therapy session, no # given. Supine to sit Supervision, slide board bed > w/c supervision. Corene Cornea entered room with new wheelchair and pt wanting to try, slide board w/c > bed > new wheelchair with CGA/Supervision. Reviewed new features, how to don/doff leg rests, armrests, etc. Trial sit to stand at sink surface, sit to stand with cues for forward weight shift and push/pull with Mod A and static stand for 30 sec-1 min. Extensive problem solving with pt for toileting set up at home as well as ramp set up. Pt self propel room > gym with Supervision and perfor, sit to partial stands x3 with Mod A from stair railing to simulate elevating buttocks from toilet for hygiene and clothing management. Self propel part of the distance to elevator, transported outside dependently for time management and pt self propelled around outdoor area for uneven surface as well as gift shop for tight turns and maneuvering in small spaces to simulate home environment with supervision. Transported back to room in same manner, slide board w/c > bed Supervision. Sit to supine same manner, alarm on call bell in reach.     Therapy Documentation Precautions:  Precautions Precautions: Fall Precaution Comments: new L BKA Required Braces or Orthoses: Other Brace Other Brace: L LE limb guard Restrictions Weight Bearing Restrictions: Yes LLE Weight Bearing: Non weight bearing     Therapy/Group: Individual Therapy  [name] 02/14/2020, 8:27 AM

## 2020-02-14 NOTE — Progress Notes (Signed)
[facility] NEPHROLOGY PROGRESS NOTE  Assessment/ Plan: Pt is a 54 y.o. yo female with new ESRD, left foot infection is status post a BKA, left hip fracture status post ORIF, now in CIR.  Physical Exam: General: Not in distress, comfortable Heart:RRR, s1s2 nl Lungs: Clear b/l, no crackle Abdomen:soft, Non-tender, non-distended Extremities: RLE edema+, left BKA Dialysis Access: Right IJ TDC, maturing left AV fistula.   Problems: # AoCKD 5,now ESRD: Baseline Creat 4.9- 5.2, eGFR 9-11 from 8/21 - 10/21. Creat 6 on admission, then mid 7's, IVF's given w/o improvementso started HD.Has been clipped to [facility].S/p [facility] and AVF on11/8, 1st HD 11/9. Continue MWF schedule. HD tomorrow.   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 S/p 4U PRBCs.Tsat low 10%, got 570m Feraheme on 11/6, holding further doses due to infection. IncreasedAranesp 60 to 100 q week on 11/17.  Monitor hemoglobin.  #L foot stump chronic open wound w/ poss osteo by plain films-> s/pL BKA 01/12/20  #L hip fracture - admission diagnosis.S/p ORIF on 10/28 per ortho.S/p I&D surgical wound 11/18. Currently on IV ertapenem until 12/30 per ID. Found to have left hip abscess status post I&D by Ortho on 12/7.  ID is following.  Status post abscess aspiration by IR on 12/10.  #HTN/ vol -lowered amlodipine dose.  Also on hydralazine, coreg and norvasc.  UF during HD.  #BMM -monitor calcium and phosphorus level.  Continue sevelamer.  PTH 44.   #Hyponatremia, hypervolemic: She has high intradialytic weight gain.  Discussed about fluid restriction.  UF as tolerated.  #Nutrition: albumin very low, on protein supplement.Renal diet w/fluid restrictions.  RKelly Splinter MD 02/14/2020, 3:40 PM      Subjective: Seen and examined.  Doing well.  Denies nausea vomiting chest pain shortness of breath.  Objective Vital signs in last 24 hours: Vitals:   02/13/20 1811 02/13/20 1928 02/14/20 0548 02/14/20 1432  BP:  126/61 (!) 120/50 (!) 136/49 (!) 137/52  Pulse: 60 60 62 63  Resp: _0 Temp: 99.1 F (37.3 C) 98.3 F (36.8 C) 98.3 F (36.8 C) 98.7 F (37.1 C)  TempSrc:  Oral Oral   SpO2: 98% 98% 97% 96%  Weight:   86.5 kg   Height:       Weight change: 0.068 kg  Intake/Output Summary (Last 24 hours) at 02/14/2020 1539 Last data filed at 02/14/2020 1120 Gross per 24 hour  Intake 240 ml  Output 3825 ml  Net -3585 ml       Labs: Basic Metabolic Panel: Recent Labs  Lab 02/08/20 1312 02/09/20 0731 02/10/20 1716 02/13/20 0632  NA 124* 130* 131* 129*  K 4.3 3.5 3.6 4.7  CL 90* 92* 92* 92*  CO2 _1 GLUCOSE 85 108* 82 90  BUN 42* 18 34* 43*  CREATININE 6.47* 4.61* 6.56* 7.18*  CALCIUM 8.4* 8.4* 8.6* 9.1  PHOS 5.0* 3.7 4.0  --    Liver Function Tests: Recent Labs  Lab 02/08/20 1312 02/09/20 0731 02/10/20 1716  ALBUMIN 2.2* 2.2* 2.1*   No results for input(s): LIPASE, AMYLASE in the last 168 hours. No results for input(s): AMMONIA in the last 168 hours. CBC: Recent Labs  Lab 02/08/20 1312 02/10/20 1716 02/13/20 0632  WBC 12.1* 11.5* 10.4  HGB 9.6* 9.4* 10.1*  HCT 31.4* 31.7* 34.1*  MCV 89.7 91.4 90.0  PLT 212 223 251   Cardiac Enzymes: No results for input(s): CKTOTAL, CKMB, CKMBINDEX, TROPONINI in the last 168 hours. CBG:  Recent Labs  Lab 02/13/20 1133 02/13/20 1808 02/13/20 2101 02/14/20 0604 02/14/20 1139  GLUCAP 106* 112* 192* 113* 100*    Iron Studies: No results for input(s): IRON, TIBC, TRANSFERRIN, FERRITIN in the last 72 hours. Studies/Results: No results found.  Medications: Infusions: . sodium chloride 250 mL (02/10/20 1842)  . ertapenem 500 mg (02/13/20 1821)    Scheduled Medications: . (feeding supplement) PROSource Plus  30 mL Oral BID BM  . alosetron  1 mg Oral BID  . amitriptyline  10 mg Oral QHS  . amLODipine  5 mg Oral Daily  . atorvastatin  40 mg Oral q1800  . carvedilol  6.25 mg Oral BID WC  . Chlorhexidine  Gluconate Cloth  6 each Topical Q0600  . Chlorhexidine Gluconate Cloth  6 each Topical Q0600  . citalopram  20 mg Oral Daily  . darbepoetin (ARANESP) injection - DIALYSIS  100 mcg Intravenous Q Mon-HD  . DULoxetine  30 mg Oral Daily  . enoxaparin (LOVENOX) injection  30 mg Subcutaneous Q24H  . [START ON 02/15/2020] heparin sodium (porcine)  1,000 Units Intravenous Q M,W,F-HD  . hydrALAZINE  100 mg Oral TID  . insulin aspart  0-5 Units Subcutaneous QHS  . insulin aspart  0-9 Units Subcutaneous TID WC  . insulin glargine  15 Units Subcutaneous QHS  . melatonin  1.5 mg Oral QHS  . morphine  15 mg Oral Q12H  . multivitamin  1 tablet Oral QHS  . pantoprazole  40 mg Oral Daily  . sevelamer carbonate  1,600 mg Oral TID WC    have reviewed scheduled and prn medications.

## 2020-02-14 NOTE — Progress Notes (Signed)
Pharmacy Antibiotic Discharge Plan   Discharge antibiotic plan for [name] will be as follows:  Ertapenem 500 mg after HD on MWF through 03/01/2020.    [name] is to receive her dialysis at the [facility] in [place]. I will discuss with them to make sure that they have the antibiotic available. Tentative plan for [name] to discharge on 12/21.    Jimmy Footman, PharmD, BCPS, BCIDP Infectious Diseases Clinical Pharmacist Phone: [phone] 02/14/2020

## 2020-02-14 NOTE — Progress Notes (Signed)
[facility] PHYSICAL MEDICINE & REHABILITATION PROGRESS NOTE   Subjective/Complaints:  LBM Sunday- feels like could have one today.   Good otherwise.   ROS:   Pt denies SOB, abd pain, CP, N/V/C/D, and vision changes   Objective:   No results found. Recent Labs    02/13/20 0632  WBC 10.4  HGB 10.1*  HCT 34.1*  PLT 251   Recent Labs    02/13/20 0632  NA 129*  K 4.7  CL 92*  CO2 28  GLUCOSE 90  BUN 43*  CREATININE 7.18*  CALCIUM 9.1    Intake/Output Summary (Last 24 hours) at 02/14/2020 0951 Last data filed at 02/14/2020 0700 Gross per 24 hour  Intake 480 ml  Output 3500 ml  Net -3020 ml     Pressure Injury 01/23/20 Buttocks Left Deep Tissue Pressure Injury - Purple or maroon localized area of discolored intact skin or blood-filled blister due to damage of underlying soft tissue from pressure and/or shear. Circular, dark, red area, surrounde (Active)  01/23/20 1609  Location: Buttocks  Location Orientation: Left  Staging: Deep Tissue Pressure Injury - Purple or maroon localized area of discolored intact skin or blood-filled blister due to damage of underlying soft tissue from pressure and/or shear.  Wound Description (Comments): Circular, dark, red area, surrounded by MASD  Present on Admission: Yes    Physical Exam: Vital Signs Blood pressure (!) 136/49, pulse 62, temperature 98.3 F (36.8 C), temperature source Oral, resp. rate 16, height _0  (1.778 m), weight 86.5 kg, SpO2 97 %. Gen: sitting up in bed- flat, appropriate, NAD HEENT: oral mucosa pink and moist, NCAT Cardio: RRR- no JVD Chest: CTA B/L- no W/R/R- good air movement Abd: Soft, NT, ND, (+)BS  Ext: no edema Skin: intact Psych: depressed flat affect- no change Skin: Warm and dry.  L BKA-  Well shaped with shrinker.  L hip looks great- sutures out-steristrips-  L thigh with no swelling or drainage Musc: Left BKA with mild edema and tenderness LLE less swollen/edematous-  Neuro:  Alert Motor: Right lower extremity: Hip flexion, knee extension 4-5/5 (pain inhibition), ankle dorsiflexion 4+/5, stable exam Left lower extremity: Hip flexion, knee extension 3/5 (some pain inhibition), ankle dorsiflexion 4/5   Assessment/Plan: 1. Functional deficits which require 3+ hours per day of interdisciplinary therapy in a comprehensive inpatient rehab setting.  Physiatrist is providing close team supervision and 24 hour management of active medical problems listed below.  Physiatrist and rehab team continue to assess barriers to discharge/monitor patient progress toward functional and medical goals  Care Tool:  Bathing    Body parts bathed by patient: Right arm,Left arm,Chest,Abdomen,Face,Right upper leg,Left upper leg,Right lower leg,Left lower leg   Body parts bathed by helper: Buttocks,Right lower leg Body parts n/a: Left lower leg   Bathing assist Assist Level: Contact Guard/Touching assist     Upper Body Dressing/Undressing Upper body dressing   What is the patient wearing?: Pull over shirt    Upper body assist Assist Level: Set up assist    Lower Body Dressing/Undressing Lower body dressing      What is the patient wearing?: Pants     Lower body assist Assist for lower body dressing: Contact Guard/Touching assist     Toileting Toileting Toileting Activity did not occur (Clothing management and hygiene only): N/A (no void or bm)  Toileting assist Assist for toileting: Maximal Assistance - Patient 25 - 49% Assistive Device Comment: female urinal   Transfers Chair/bed transfer  Transfers assist  Chair/bed transfer activity did not occur: Safety/medical concerns  Chair/bed transfer assist level: Supervision/Verbal cueing Chair/bed transfer assistive device: Sliding board   Locomotion Ambulation   Ambulation assist   Ambulation activity did not occur: Safety/medical concerns          Walk 10 feet activity   Assist  Walk 10 feet  activity did not occur: Safety/medical concerns        Walk 50 feet activity   Assist Walk 50 feet with 2 turns activity did not occur: Safety/medical concerns         Walk 150 feet activity   Assist Walk 150 feet activity did not occur: Safety/medical concerns         Walk 10 feet on uneven surface  activity   Assist Walk 10 feet on uneven surfaces activity did not occur: Safety/medical concerns         Wheelchair     Assist Will patient use wheelchair at discharge?: Yes Type of Wheelchair: Manual    Wheelchair assist level: Supervision/Verbal cueing Max wheelchair distance: 150'    Wheelchair 50 feet with 2 turns activity    Assist        Assist Level: Supervision/Verbal cueing   Wheelchair 150 feet activity     Assist      Assist Level: Supervision/Verbal cueing   Medical Problem List and Plan: 1.  Impaired mobility and ADLs secondary to L BKA  Continue CIR 2.  Antithrombotics: -DVT/anticoagulation:  Pharmaceutical: Lovenox             -antiplatelet therapy: N/A 3. Chronic pain/Pain Management: Was on oxycodone 5 mg tid prn as well Cymbalta for neuropathy (at baseline)- now on 10-15 mg q4 hours prn  12/2- will start MS Contin 15 mg BID  [facility]/3-5[facility] improved pain for pt- con't MS Contin  12/9- pain a little worse today- change flexeril to TID prn  12/14- pain controlled- con't regimen  4. Mood: Team to provide ego support/encouragement.              -antipsychotic agents: N/A 5. Neuropsych: This patient is capable of making decisions on her own behalf. 6. Skin/Wound Care: Monitor wound daily   Continue stump shrinker   11/30- will call Vascular on Thursday about getting sutures out of L BKA- has been 4 weeks.   12/3- L/M- with vascular  12/5 vascular hasn't seen pt yet--primary team will need to reach out again tomorrow  12/6- will check with vascular if can get staples out-   12/12: Left BKA and hip incisions examined and  healing very well! 7. Fluids/Electrolytes/Nutrition: Strict I/O. 1200 cc FR. Will consult dietician to educate on renal diet.  8. Left hip ORIF/hip abscess s/p I&D 11/18: NWB LLE.   Flagyl changed to Ertepenem due to patient refusal, continue  12/1- on Ertepenem until end of December.  12/7- pt has new rim enhancing lesions in L hip, thigh/glute, etc- have called Ortho and also calling ID to discuss- might have been since switched off Flagyl?   12/9- ID suggested going to IR to get abscesses drained- WBC is still 12.1k- con't Ertepenem  12/10- IR today for draining abscesses  12/11: CBC ordered for Monday.   12/13- WBC down to 10.4  9. 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 T1DM with neuropathy: Hgb A1c-6.2. Was on Lantus 50 units in am with 10  units novolog tid.     Lantus 38 twice daily, decreased to 10 on 11/27, increased to 15 on 11/28  NovoLog 5 units with breakfast, 8 units with lunch and dinner, decreased to 5 3 times daily on 11/27, DC'd on 11/27.  12/8- Had hypoglycemia x1- of 69- con't regimen for now  12/12: CBGs 101-150: educated patient regarding goal CBGs and benefits of no added sugar in diet.  12/13- BGs 91-141- con't regimen   12/14- has spike up to 192, but otherwise controlled 11. Now ESRD: Now on HD MWF (set up at [facility] post discharge)  12. Anemia of chronic disease/iron deficiency: Hgb-6.3 at admission--->9.1 s/p 4 units PRBC. Feraheme X 1 (additional dose held due to infection) and Aranesp weekly.   Hemoglobin 9.7 on 11/26, repeat CBC ordered for Monday.   12/13- Hb 10.1- improving  Continue to monitor 13. HTN: Monitor BP tid--continue Coreg bid, Norvasc and apresoline tid.   Decreased Coreg to 12.4m BID.   103/15 diastolic soft and pulse bradycardic: decrease coreg to 6.226mBID.  14. IBS-D:   MiraLAX DC'd  Resumed Lotrenex    Colace as  needed ordered  12/2- will make colace scheduled and add sorbitol if no BM by 3pm  12/4 changed colace to senna-s given addn of MS Contin  12/6- refusing laxatives- but had BM Saturday and this AM, so con't regimen  12/9- LBM 2 days ago- feels like can go today- will wait on sorbitol until tomorrow  12/10- tiny BM yesterday -refuses additional bowel meds. Explained on pain meds- needs bowel meds  12/13- BMs x2 over weekend- insisted we stop laxatives- won't take.   12/14- LBM 2 days ago, but feel slike could go today.  15. Malnutrition: Continue protein supplement.  16. Abdominal pain: Due to diarrhea? PPI resumed-->patient to have family bring in [place] home.  17.  Sleep disturbance:   Low dose elavil at bedtime.  Melatonin added on 11/26  Improving overall 18. Leukocytosis:   WBCs 11.6 on 11/26, labs with HD  See #8, #9  Afebrile  12/3- WBC 11.1- afebrile; no Sx's- con't to monitor  12/6- WBC 11.3- cause??  12/7- WBC 12.0- due to abscesses in L thigh, glute, etc causing edema- have contacted Ortho AND ID- waiting to hear back.   12/9- ID is calling IR to get abscesses drained.  12/10- IR doing IR drainage today  12/14- 40 cc purulent drainage- no growth to date.  19. LE/thigh edema  12/6- will check CT of abd/pelvis with contrast to determine cause-got OK by nephrology to use contrast- has 2-3+ edema of thighs L>R  12/7- due to rim enhancing lesions- as above.   12/8- will con't Ertepenem- per ID.   12/13- s/p IR- WBC 10.4- con't Ertepenem 20. Depression-new-  12/8- will add Celexa 20 mg daily- max DOSE  12/9- had to decrease Duloxetine to 30 mg daily.    LOS: 22 days A FACE TO FACE EVALUATION WAS PERFORMED  [name] 02/14/2020, 9:51 AM

## 2020-02-14 NOTE — Progress Notes (Signed)
Physical Therapy Session Note  Patient Details  Name: [name] MRN: [id number] Date of Birth: [birth year]  Today's Date: 02/14/2020 PT Individual Time: 4132-4401 PT Individual Time Calculation (min): 60 min   Short Term Goals: Week 3:  PT Short Term Goal 1 (Week 3): Pt to initiate gait training in parallel bars with mod A PT Short Term Goal 2 (Week 3): Pt to perform sit to stand consistently with Mod A x1 PT Short Term Goal 3 (Week 3): Patient will demonstrate standing balance with CGA x 2 minutes with 1-2 UE support in prep for standing ADL PT Short Term Goal 4 (Week 3): Pt will lateral scoot left and right independent on EOB without cuing for head hips relationship  Skilled Therapeutic Interventions/Progress Updates:    Patient in supine and reports pain better controlled today.  Supine to sit with S.  Slide board transfer with w/c set up with S except min A for legrests.  Patient propelled over level tile and incline surfaces with S x 200+'.  Patient in parallel bars performed sit to stand mod A with cues.  Steps forward x 3 hops, then after demonstration for arms extended and anterior weight shift performed x 4 hops with slight improved clearance.  Patient performed slide board with S to mat.  Sit to supine S.  Performed L hip abduction, SLR, over bolster bridging bilateral for hip extension, SAQ 5# on R and on L without weight focus on ROM.  Sidelying hip extension with A all x 10.  Side to sit with S.  Transferred to w/c S.  Patient propelled to room mod I and transferred to bed with S.  Left in supine with needs in reach.  Therapy Documentation Precautions:  Precautions Precautions: Fall Precaution Comments: new L BKA Required Braces or Orthoses: Other Brace Other Brace: L LE limb guard Restrictions Weight Bearing Restrictions: Yes LLE Weight Bearing: Non weight bearing Pain: Pain Assessment Pain Scale: 0-10 Pain Score: 7  Pain Type: Acute pain Pain Location: Hip Pain  Orientation: Left Pain Descriptors / Indicators: Aching Pain Onset: With Activity Pain Intervention(s): Repositioned    Therapy/Group: Individual Therapy  Reginia Naas  Magda Kiel, PT 02/14/2020, 12:10 PM

## 2020-02-14 NOTE — Patient Care Conference (Signed)
Inpatient RehabilitationTeam Conference and Plan of Care Update Date: 02/14/2020   Time: 11:43 AM    Patient Name: [name]      Medical Record Number: [id number]  Date of Birth: Nov 12, 1965 Sex: Female         Room/Bed: 4M01C/4M01C-01 Payor Info: Payor: Theodosia Birdseye / Plan: Leigh / Product Type: *No Product type* /    Admit Date/Time:  01/23/2020  3:45 PM  Primary Diagnosis:  Below-knee amputation of left lower extremity [facility])  Hospital Problems: Principal Problem:   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[place]) Active Problems:   ESRD on dialysis ([place])   Sleep disturbance   Postoperative pain   Controlled type 1 diabetes mellitus with hyperglycemia ([place])   Leukocytosis   Essential hypertension   Bacteremia   Hypoglycemia   Abscess    Expected Discharge Date: Expected Discharge Date: 02/21/20  Team Members Present: Physician leading conference: Dr. Courtney Heys Care Coodinator Present: Dorthula Nettles, RN, BSN, CRRN;Becky Dupree, LCSW Nurse Present: Annita Brod, LPN PT Present: Magda Kiel, PT OT Present: Lillia Corporal, OT PPS Coordinator present : Ileana Ladd, Burna Mortimer, SLP     Current Status/Progress Goal Weekly Team Focus  Bowel/Bladder   Pt is continent x2.  Pt will remain continent x2.  Assess q shift and prn.   Swallow/Nutrition/ Hydration             ADL's   Supervision/CGA slide board, LB dress Supervision EOB, UB bathe/dress sink with Set up/supervision, continues to need cues for brakes and assist sometimes for board placement, Max A for toileting tasks  Min-Mod for LB, Min for transfers  squat pivot transfers, toileting tasks clothing management and hygiene, UB strength, sit to stands, family ed   Mobility   S slide board transfers some intermittent A for w/c set up, but w/c eval done 12/13 and specialist bringing loaner chair for practice; mod A sit to stand, CGA static balance with UE support  2 minutes.  mod I slide board transfer, min A sit to stand, min A short distance ambulation, mod I w/c mobility  standing balance/tolerance, gait initiation, w/c set up, tight spaces, discussing with daughter ramp, need to start caregiver ed   Communication             Safety/Cognition/ Behavioral Observations            Pain   Pt reports pain is controlled with medication.  Pain will remain <3.  Assess q shift and prn.   Skin   Pt has L-BKA, L Hip Dressing, LFA fistula.  Pt will continue to heal; dressing changes to be completed per order.  Assess q shift and prn.     Discharge Planning:  Pt continues to make slow progress toward her goals. Will need daughter to come in for education prior to discharge home. Pt looking into ramp information given   Team Discussion: Abscess drained, no growth at this point, pain better controlled, blood sugars have been good. Continent B/B, DTI to buttock. Needs ramp in order to get to dialysis, daughter needs to come in for family education. OT reports most of bathing is completed at the sink level, lateral lean for dressing. Toileting is the biggest barrier. PT reports patient can set-up W/C. Corene Cornea came by on Monday to set up custom chair. Did initiate gait training today. Sitting balance is improved.  Patient on target to meet rehab goals: yes  *See Care Plan and  progress notes for long and short-term goals.   Revisions to Treatment Plan:  Continue to work on toilet transfers Continue to work on Personnel officer Continue to work on sitting balance  Teaching Needs: Continue family education, daughter needs to come in soon. Family education on wound care and dressing changes Family education on weight bearing precautions  Current Barriers to Discharge: Decreased caregiver support, Home enviroment access/layout, Wound care, Lack of/limited family support, Weight bearing restrictions, Behavior and pain management.  Possible Resolutions to  Barriers: Continue current medications and pain management, educate wound care/dressing changes, provide emotional support to patient and family.     Medical Summary Current Status: 9/10 this AM pain- not usually c/o pain; continent B/B; skin helaing L hip and L BKA; and drain L hip from abscess; LUE fistula; DTI on buttocks  Barriers to Discharge: Decreased family/caregiver support;Home enviroment access/layout;Hemodialysis;Medical stability;Weight bearing restrictions;Wound care;IV antibiotics  Barriers to Discharge Comments: L BKA- needs ramp to get to HD; has 5 steps; needs family education before d/c; evaluated for new w/c by [facility]- d/c 12/21 Possible Resolutions to [employer] Focus: con't MS Contin- doing great with OT- toileting biggest obstacle- drop arm BSC- transfer board; toileting min-mod A;   Continued Need for Acute Rehabilitation Level of Care: The patient requires daily medical management by a physician with specialized training in physical medicine and rehabilitation for the following reasons: Direction of a multidisciplinary physical rehabilitation program to maximize functional independence : Yes Medical management of patient stability for increased activity during participation in an intensive rehabilitation regime.: Yes Analysis of laboratory values and/or radiology reports with any subsequent need for medication adjustment and/or medical intervention. : Yes   I attest that I was present, lead the team conference, and concur with the assessment and plan of the team.   Cristi Loron 02/14/2020, 5:55 PM

## 2020-02-14 NOTE — Progress Notes (Addendum)
[facility]  Date of Admission:  01/23/2020           Current antibiotics: Ertapenem  Reason for visit: Follow up on Enterobacter cloacae and Bacteroides wound infection  Interval events: No acute events noted Abscess fluid cultures remain negative Afebrile Inflammatory markers trending down   ASSESSMENT:    #Hip fracture status post intramedullary fixation 05/69  #Complicated by wound infection with Enterobacter cloacae and Bacteroides.  Status post I&D 11/18.  Currently on antibiotics for planned 6-week course through 79/48  #Further complicated by abscess in the left hip status post IR aspiration 12/10 with 40 cc of purulent fluid.  Cultures no growth to date.  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  Please see separate ID pharmacist note from today regarding this. --Follow-up appointment scheduled with me on December 28 at 10:45 AM to determine final duration of antibiotics, however, plan is for stopping after December 30 as this will represent 6 weeks after incision and drainage on November 18.   --She reports tentative plan is to discharge from rehab on 02/21/20. Recommend repeating CT scan at that time prior to discharge to reassess fluid collection as this will be helpful information to have at her follow up appointment. --Will sign off for now, please call back as needed  MEDICATIONS:    Scheduled Meds: . (feeding supplement) PROSource Plus  30 mL Oral BID BM  . alosetron  1 mg Oral BID  . amitriptyline  10 mg Oral QHS  . amLODipine  5 mg Oral Daily  . atorvastatin  40 mg Oral q1800  . carvedilol  6.25 mg Oral BID WC  . Chlorhexidine Gluconate Cloth  6 each Topical Q0600  . Chlorhexidine Gluconate Cloth  6 each Topical Q0600  . citalopram  20 mg  Oral Daily  . darbepoetin (ARANESP) injection - DIALYSIS  100 mcg Intravenous Q Mon-HD  . DULoxetine  30 mg Oral Daily  . enoxaparin (LOVENOX) injection  30 mg Subcutaneous Q24H  . [START ON 02/15/2020] heparin sodium (porcine)  1,000 Units Intravenous Q M,W,F-HD  . hydrALAZINE  100 mg Oral TID  . insulin aspart  0-5 Units Subcutaneous QHS  . insulin aspart  0-9 Units Subcutaneous TID WC  . insulin glargine  15 Units Subcutaneous QHS  . melatonin  1.5 mg Oral QHS  . morphine  15 mg Oral Q12H  . multivitamin  1 tablet Oral QHS  . pantoprazole  40 mg Oral Daily  . sevelamer carbonate  1,600 mg Oral TID WC    Continuous Infusions: . sodium chloride 250 mL (02/10/20 1842)  . ertapenem 500 mg (02/13/20 1821)    PRN Meds: sodium chloride, acetaminophen, bisacodyl, calcium carbonate, cyclobenzaprine, diphenhydrAMINE, guaiFENesin-dextromethorphan, lip balm, loratadine, Muscle Rub, oxyCODONE, [DISCONTINUED] menthol-cetylpyridinium **OR** phenol, polyvinyl alcohol, prochlorperazine **OR** prochlorperazine **OR** prochlorperazine, sodium chloride, traMADol  SUBJECTIVE:   No complaints Reports plan for DC is 12/21.  Tolerating Rx.  No fevers or chills.  Discussed follow up plan.     OBJECTIVE:   Allergies  Allergen Reactions  . Trazodone Swelling  . Latex Rash  . Lidocaine Itching    Blood pressure (!) 136/49, pulse 62, temperature 98.3 F (36.8 C), temperature source Oral, resp. rate 16, height 5\' 10"  (1.778 m), weight  86.5 kg, SpO2 97 %. Body mass index is 27.36 kg/m.  Physical Exam Constitutional:      Comments: Lying in bed, no acute distress, appears comfortable.   HENT:     Head: Normocephalic and atraumatic.  Musculoskeletal:     Comments: Status post left BKA.  Neurological:     General: No focal deficit present.     Mental Status: She is oriented to person, place, and time.  Psychiatric:        Mood and Affect: Mood normal.        Behavior: Behavior normal.       Lab Results & Microbiology Lab Results  Component Value Date   WBC 10.4 02/13/2020   HGB 10.1 (L) 02/13/2020   HCT 34.1 (L) 02/13/2020   MCV 90.0 02/13/2020   PLT 251 02/13/2020    Lab Results  Component Value Date   NA 129 (L) 02/13/2020   K 4.7 02/13/2020   CO2 28 02/13/2020   GLUCOSE 90 02/13/2020   BUN 43 (H) 02/13/2020   CREATININE 7.18 (H) 02/13/2020   CALCIUM 9.1 02/13/2020   GFRNONAA 6 (L) 02/13/2020   GFRAA 13 (L) 12/17/2018    Lab Results  Component Value Date   ALT 6 01/07/2020   AST 21 01/07/2020   ALKPHOS 223 (H) 01/07/2020   BILITOT 0.7 01/07/2020     I have reviewed the micro and lab results in Epic.  Imaging No results found.     Rayn[facility] for Infectious Disease [facility] [phone] pager 02/14/2020, 10:02 AM  I have spent a total of 25 minutes with the patient reviewing hospital notes,  test results, labs and examining the patient as well as establishing an assessment and plan.

## 2020-02-15 ENCOUNTER — Inpatient Hospital Stay (HOSPITAL_COMMUNITY): Payer: Medicaid Other | Admitting: Occupational Therapy

## 2020-02-15 ENCOUNTER — Inpatient Hospital Stay (HOSPITAL_COMMUNITY): Payer: Medicaid Other

## 2020-02-15 LAB — CBC
HCT: 33.4 % — ABNORMAL LOW (ref 36.0–46.0)
Hemoglobin: 10 g/dL — ABNORMAL LOW (ref 12.0–15.0)
MCH: 27 pg (ref 26.0–34.0)
MCHC: 29.9 g/dL — ABNORMAL LOW (ref 30.0–36.0)
MCV: 90 fL (ref 80.0–100.0)
Platelets: 247 10*3/uL (ref 150–400)
RBC: 3.71 MIL/uL — ABNORMAL LOW (ref 3.87–5.11)
RDW: 15.5 % (ref 11.5–15.5)
WBC: 11.5 10*3/uL — ABNORMAL HIGH (ref 4.0–10.5)
nRBC: 0 % (ref 0.0–0.2)

## 2020-02-15 LAB — RENAL FUNCTION PANEL
Albumin: 2.3 g/dL — ABNORMAL LOW (ref 3.5–5.0)
Anion gap: 10 (ref 5–15)
BUN: 38 mg/dL — ABNORMAL HIGH (ref 6–20)
CO2: 27 mmol/L (ref 22–32)
Calcium: 8.9 mg/dL (ref 8.9–10.3)
Chloride: 96 mmol/L — ABNORMAL LOW (ref 98–111)
Creatinine, Ser: 6.25 mg/dL — ABNORMAL HIGH (ref 0.44–1.00)
GFR, Estimated: 8 mL/min — ABNORMAL LOW (ref >60)
Glucose, Bld: 155 mg/dL — ABNORMAL HIGH (ref 70–99)
Phosphorus: 3.8 mg/dL (ref 2.5–4.6)
Potassium: 4.1 mmol/L (ref 3.5–5.1)
Sodium: 133 mmol/L — ABNORMAL LOW (ref 135–145)

## 2020-02-15 LAB — AEROBIC/ANAEROBIC CULTURE W GRAM STAIN (SURGICAL/DEEP WOUND)
Culture: NO GROWTH
Special Requests: NORMAL

## 2020-02-15 LAB — GLUCOSE, CAPILLARY
Glucose-Capillary: 128 mg/dL — ABNORMAL HIGH (ref 70–99)
Glucose-Capillary: 97 mg/dL (ref 70–99)
Glucose-Capillary: 168 mg/dL — ABNORMAL HIGH (ref 70–99)
Glucose-Capillary: 158 mg/dL — ABNORMAL HIGH (ref 70–99)

## 2020-02-15 MED ORDER — LIDOCAINE HCL (PF) 1 % IJ SOLN: 5.0000 mL | INTRAMUSCULAR | Status: DC | PRN

## 2020-02-15 MED ORDER — SODIUM CHLORIDE 0.9 % IV SOLN: 100.0000 mL | INTRAVENOUS | Status: DC | PRN

## 2020-02-15 MED ORDER — ALTEPLASE 2 MG IJ SOLR: 2.0000 mg | Freq: Once | INTRAMUSCULAR | Status: DC | PRN

## 2020-02-15 MED ORDER — LIDOCAINE-PRILOCAINE 2.5-2.5 % EX CREA: 1.0000 "application " | TOPICAL_CREAM | CUTANEOUS | Status: DC | PRN

## 2020-02-15 MED ORDER — PENTAFLUOROPROP-TETRAFLUOROETH EX AERO: 1.0000 "application " | INHALATION_SPRAY | CUTANEOUS | Status: DC | PRN

## 2020-02-15 MED ORDER — HEPARIN SODIUM (PORCINE) 1000 UNIT/ML DIALYSIS
1000.0000 [IU] | INTRAMUSCULAR | Status: DC | PRN
  Administered 2020-02-15: 1000 [IU] via INTRAVENOUS_CENTRAL

## 2020-02-15 MED ORDER — HEPARIN SODIUM (PORCINE) 1000 UNIT/ML DIALYSIS: 20.0000 [IU]/kg | INTRAMUSCULAR | Status: DC | PRN

## 2020-02-15 MED ORDER — HEPARIN SODIUM (PORCINE) 1000 UNIT/ML IJ SOLN
INTRAMUSCULAR | Status: AC
  Filled 2020-02-15: qty 3

## 2020-02-15 MED ORDER — HEPARIN SODIUM (PORCINE) 1000 UNIT/ML DIALYSIS: 1000.0000 [IU] | INTRAMUSCULAR | Status: DC | PRN

## 2020-02-15 NOTE — Progress Notes (Signed)
Patient verbalizes no apparent distress or discomfort throughout shift, resting quietly, cooperative, staff assisted with ADL;s prn, Anticipates dialysis after her therapy today, call bell, and bed alarm in place and on. Monitor and assisted

## 2020-02-15 NOTE — Procedures (Signed)
   I was present at this dialysis session, have reviewed the session itself and made  appropriate changes [name] MD [place] pager [phone]   02/15/2020, 3:08 PM

## 2020-02-15 NOTE — Progress Notes (Signed)
Occupational Therapy Weekly Progress Note  Patient Details  Name: [name] MRN: [id number] Date of Birth: 1965-04-25  Beginning of progress report period: February 09, 2020 End of progress report period: February 15, 2020  Today's Date: 02/15/2020 OT Individual Time: 8889-1694 and [id number] OT Individual Time Calculation (min): 55 min and 58 min   Patient has met 2 of 4 short term goals.  Pt is overall Supervision for LB dressing at EOB with lateral leans but requires Max A at sit to stand level as pt progresses toward more standing ADLs. Slide board transfers to/from [facility] with close supervision. Continues to require increased assistance for toileting tasks for clothing management and hygiene but is improving anterior weight shift and lifting buttocks noted this week. Planned family education session with daughter [name] in upcoming week prior to DC.   Patient continues to demonstrate the following deficits: muscle weakness, decreased cardiorespiratoy endurance, impaired timing and sequencing and decreased sitting balance, decreased standing balance and decreased balance strategies and therefore will continue to benefit from skilled OT intervention to enhance overall performance with BADL and Reduce care partner burden.  Patient not progressing toward long term goals.  See goal revision..  Plan of care revisions: some LB ADLs down graded based on lack of progress.  OT Short Term Goals Week 3:  OT Short Term Goal 1 (Week 3): Pt will perform sit to stand Mod A in prep for LB ADL OT Short Term Goal 1 - Progress (Week 3): Met OT Short Term Goal 2 (Week 3): Pt will perform clothing management with lateral leans on BSC with no more than Mod A OT Short Term Goal 2 - Progress (Week 3): Progressing toward goal OT Short Term Goal 3 (Week 3): Pt will perform BSC transfers with supervision OT Short Term Goal 3 - Progress (Week 3): Progressing toward goal OT Short Term Goal 4 (Week 3): Pt will  perform 10 minute ADL/activity of choice without fatigue or rest break OT Short Term Goal 4 - Progress (Week 3): Met Week 4:  OT Short Term Goal 1 (Week 4): STGs = LTGs d/t ELOS  Skilled Therapeutic Interventions/Progress Updates:    Session 1: Pt greeted at time of session reclined in bed agreeable to OT session, 8/10 L hip pain and RN performed med pass. Pt noted to be soaked with urine d/t spilling female urinal in bed, doffed LB clothing at EOB with Supervision with lateral leans and placed towel on slide board to transfer to wheelchair with CGA. Sink level bathing for UB set up, LB bathing with Supervision as well seated excluding periarea and buttocks. Donned underwear and pants seated in wheelchair with supervision and once at thigh level, sit to stand at sink with Mod A and therapist assist donning over hips with pt in static standing with BUE support. Set up in wheelchair for PT session, call bell in reach all needs met.   Session 2: Pt greeted at time of session supine in bed resting agreeable to OT session with focus on toileting and clothing management/hygiene. Supine to sit Supervision, slide board bed > w/c with supervision. Transported pt to bathroom dependently with plans to attempt squat pivot to toilet but pt declined as she preferred to use [facility] and wants to do sit to stands at home to switch out w/c and BSC. Pt performed sit to stands x2 trials at sink level with Mod A for power up and cues for anterior weight shift and static stand for therapist to  switch wheelchair and BSC, continent void of urine but no BM, able to partially assist with hygiene and total A for donning/doffing pants in static standing. Plan is per pt request to do toileting in the manner at home with daughter [name]. Continued to problem solve through ramp purchasing as well throughout session. Slide board back to bed Supervision, alarm on call bell in reach.   Therapy Documentation Precautions:   Precautions Precautions: Fall Precaution Comments: new L BKA Required Braces or Orthoses: Other Brace Other Brace: L LE limb guard Restrictions Weight Bearing Restrictions: Yes LLE Weight Bearing: Non weight bearing     Therapy/Group: Individual Therapy  [name] 02/15/2020, 10:17 AM

## 2020-02-15 NOTE — Progress Notes (Signed)
Physical Therapy Weekly Progress Note  Patient Details  Name: [name] MRN: [id number] Date of Birth: Nov 11, 1965  Beginning of progress report period: February 08, 2020 End of progress report period: February 15, 2020  Today's Date: 02/15/2020 PT Individual Time: 1030-1130 PT Individual Time Calculation (min): 60 min   Patient has met 3 of 4 short term goals.  Patient progressing well this week in terms of slide board transfers, sitting balance as well as with sit to stands and able to initiate gait in the parallel bars.  She remains at times needing max A for sit to stand if seat height is lower.  Patient in addition able to propel wheelchair over longer distances with improved efficiency with custom loaner w/c.  Progressing towards LTG's with plans to initiate caregiver education.   Patient continues to demonstrate the following deficits muscle weakness, muscle joint tightness and pain and therefore will continue to benefit from skilled PT intervention to increase functional independence with mobility.  Patient progressing toward long term goals..  Continue plan of care.  PT Short Term Goals Week 3:  PT Short Term Goal 1 (Week 3): Pt to initiate gait training in parallel bars with mod A PT Short Term Goal 1 - Progress (Week 3): Partly met PT Short Term Goal 2 (Week 3): Pt to perform sit to stand consistently with Mod A x1 PT Short Term Goal 2 - Progress (Week 3): Met PT Short Term Goal 3 (Week 3): Patient will demonstrate standing balance with CGA x 2 minutes with 1-2 UE support in prep for standing ADL PT Short Term Goal 3 - Progress (Week 3): Met PT Short Term Goal 4 (Week 3): Pt will lateral scoot left and right independent on EOB without cuing for head hips relationship Week 4:  PT Short Term Goal 1 (Week 4): STG=LTG due to ELOS  Skilled Therapeutic Interventions/Progress Updates:  Ambulation/gait training;Balance/vestibular training;DME/adaptive equipment  instruction;Neuromuscular re-education;UE/LE Strength taining/ROM;Wheelchair propulsion/positioning;Therapeutic Activities;Therapeutic Exercise;Patient/family education;Functional mobility training;Disease management/prevention;Pain management;Skin care/wound management   Patient seated in w/c after finishing OT session.  Noted in loaner custom w/c so throughout session needing mod cues and occasional CGA for managing w/c parts.  Patient propelled to w/c closet with S.  Time to locate legrest for loaner chair for L LE, but no amputee pad for fitting her w/c. [place] to ask for regular drop down legrest for her chair to allow amputee support pad to be clamped to her legrest.  Patient propelled in tight space in w/c closet with S and cues.  Propelled x 300' to gym to parallel bars.  Patient sit to stand in parallel bars max A from wheelchair height, added balance mat to seat for increased height.  Subsequent sit to stands x 2 with mod A and cues for hand placement.  Patient ambulated in bars about 3' x 3 reps increased time and difficulty throughout for foot clearance.  Patient transferred to mat with slide board and S increased time as noted for managing w/c parts.  Patient sit to/from supine with S and performed LE therex consisting of hip abduction, hip flexion, bridging over bolster, hip adductor squeezes and L knee flexion/ext all x 10.  Patient transferred to w/c and to bed with S slide board transfer.  Propelled to room from gym mod I.  Left in supine with all needs in reach.  Discussed returning to place legrest on w/c if delivered.   Therapy Documentation Precautions:  Precautions Precautions: Fall Precaution Comments: new  L BKA Required Braces or Orthoses: Other Brace Other Brace: L LE limb guard Restrictions Weight Bearing Restrictions: Yes LLE Weight Bearing: Non weight bearing Pain: Pain Assessment Pain Score: 7  Pain Type: Acute pain Pain Location: Hip Pain Orientation:  Left Pain Descriptors / Indicators: Sore Pain Onset: With Activity Pain Intervention(s): Rest   Therapy/Group: Individual Therapy  Reginia Naas  Magda Kiel, PT 02/15/2020, 12:17 PM

## 2020-02-15 NOTE — Progress Notes (Signed)
[place] PHYSICAL MEDICINE & REHABILITATION PROGRESS NOTE   Subjective/Complaints:  Pt reports no BM since Sunday, but feels like would go today- also gassy.   Asking when staples would be removed.     ROS:   Pt denies SOB, abd pain, CP, N/V/C/D, and vision changes   Objective:   No results found. Recent Labs    02/13/20 0632  WBC 10.4  HGB 10.1*  HCT 34.1*  PLT 251   Recent Labs    02/13/20 0632  NA 129*  K 4.7  CL 92*  CO2 28  GLUCOSE 90  BUN 43*  CREATININE 7.18*  CALCIUM 9.1    Intake/Output Summary (Last 24 hours) at 02/15/2020 2263 Last data filed at 02/15/2020 3354 Gross per 24 hour  Intake 480 ml  Output 325 ml  Net 155 ml     Pressure Injury 01/23/20 Buttocks Left Deep Tissue Pressure Injury - Purple or maroon localized area of discolored intact skin or blood-filled blister due to damage of underlying soft tissue from pressure and/or shear. Circular, dark, red area, surrounde (Active)  01/23/20 1609  Location: Buttocks  Location Orientation: Left  Staging: Deep Tissue Pressure Injury - Purple or maroon localized area of discolored intact skin or blood-filled blister due to damage of underlying soft tissue from pressure and/or shear.  Wound Description (Comments): Circular, dark, red area, surrounded by MASD  Present on Admission: Yes    Physical Exam: Vital Signs Blood pressure (!) 133/105, pulse (!) 58, temperature 98.3 F (36.8 C), resp. rate 18, height _0  (1.778 m), weight 89.8 kg, SpO2 94 %. Gen: awake, alert, sitting up , NAD, OT in room HEENT: oral mucosa pink and moist, NCAT Cardio: bradycardic, regular rhythm Chest: CTA B/L- no W/R/R- good air movement Abd: soft, NT, ND, hypoactive BS Ext: no edema Skin: intact Psych: depressed flat affect-slightly more interactive Skin: Warm and dry.  L BKA-  Well shaped with shrinker.  L hip looks great- sutures out-steristrips-  L thigh with no swelling or drainage Musc: Left BKA with  mild edema and tenderness LLE less swollen/edematous-  Neuro: Alert Motor: Right lower extremity: Hip flexion, knee extension 4-5/5 (pain inhibition), ankle dorsiflexion 4+/5, stable exam Left lower extremity: Hip flexion, knee extension 3/5 (some pain inhibition), ankle dorsiflexion 4/5   Assessment/Plan: 1. Functional deficits which require 3+ hours per day of interdisciplinary therapy in a comprehensive inpatient rehab setting.  Physiatrist is providing close team supervision and 24 hour management of active medical problems listed below.  Physiatrist and rehab team continue to assess barriers to discharge/monitor patient progress toward functional and medical goals  Care Tool:  Bathing    Body parts bathed by patient: Right arm,Left arm,Chest,Abdomen,Face,Right upper leg,Left upper leg,Right lower leg,Left lower leg   Body parts bathed by helper: Buttocks,Right lower leg Body parts n/a: Left lower leg   Bathing assist Assist Level: Contact Guard/Touching assist     Upper Body Dressing/Undressing Upper body dressing   What is the patient wearing?: Pull over shirt    Upper body assist Assist Level: Set up assist    Lower Body Dressing/Undressing Lower body dressing      What is the patient wearing?: Pants     Lower body assist Assist for lower body dressing: Contact Guard/Touching assist     Toileting Toileting Toileting Activity did not occur (Clothing management and hygiene only): N/A (no void or bm)  Toileting assist Assist for toileting: Maximal Assistance - Patient 25 - 49% Assistive  Device Comment: female urinal   Transfers Chair/bed transfer  Transfers assist  Chair/bed transfer activity did not occur: Safety/medical concerns  Chair/bed transfer assist level: Supervision/Verbal cueing Chair/bed transfer assistive device: Sliding board   Locomotion Ambulation   Ambulation assist   Ambulation activity did not occur: Safety/medical  concerns  Assist level: Moderate Assistance - Patient 50 - 74% Assistive device: Parallel bars Max distance: 3'   Walk 10 feet activity   Assist  Walk 10 feet activity did not occur: Safety/medical concerns        Walk 50 feet activity   Assist Walk 50 feet with 2 turns activity did not occur: Safety/medical concerns         Walk 150 feet activity   Assist Walk 150 feet activity did not occur: Safety/medical concerns         Walk 10 feet on uneven surface  activity   Assist Walk 10 feet on uneven surfaces activity did not occur: Safety/medical concerns         Wheelchair     Assist Will patient use wheelchair at discharge?: Yes Type of Wheelchair: Manual    Wheelchair assist level: Supervision/Verbal cueing Max wheelchair distance: 150'    Wheelchair 50 feet with 2 turns activity    Assist        Assist Level: Supervision/Verbal cueing   Wheelchair 150 feet activity     Assist      Assist Level: Supervision/Verbal cueing   Medical Problem List and Plan: 1.  Impaired mobility and ADLs secondary to L BKA  Continue CIR 2.  Antithrombotics: -DVT/anticoagulation:  Pharmaceutical: Lovenox             -antiplatelet therapy: N/A 3. Chronic pain/Pain Management: Was on oxycodone 5 mg tid prn as well Cymbalta for neuropathy (at baseline)- now on 10-15 mg q4 hours prn  12/2- will start MS Contin 15 mg BID  [facility] improved pain for pt- con't MS Contin  12/9- pain a little worse today- change flexeril to TID prn  12/15- pain controlled when takes meds- con't regimen 4. Mood: Team to provide ego support/encouragement.              -antipsychotic agents: N/A 5. Neuropsych: This patient is capable of making decisions on her own behalf. 6. Skin/Wound Care: Monitor wound daily   Continue stump shrinker   12/15- will call Vascular again and see if we can remove staples- we called- they said they would come see pt.  7.  Fluids/Electrolytes/Nutrition: Strict I/O. 1200 cc FR. Will consult dietician to educate on renal diet.  8. Left hip ORIF/hip abscess s/p I&D 11/18: NWB LLE.   Flagyl changed to Ertepenem due to patient refusal, continue  12/1- on Ertepenem until end of December.  12/7- pt has new rim enhancing lesions in L hip, thigh/glute, etc- have called Ortho and also calling ID to discuss- might have been since switched off Flagyl?   12/9- ID suggested going to IR to get abscesses drained- WBC is still 12.1k- con't Ertepenem  12/10- IR today for draining abscesses  12/11: CBC ordered for Monday.   12/13- WBC down to 10.4  9. 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 T1DM with neuropathy: Hgb A1c-6.2. Was on Lantus 50 units in am with 10 units novolog tid.     Lantus 38 twice daily, decreased to 10 on  11/27, increased to 15 on 11/28  NovoLog 5 units with breakfast, 8 units with lunch and dinner, decreased to 5 3 times daily on 11/27, DC'd on 11/27.  12/8- Had hypoglycemia x1- of 69- con't regimen for now  12/12: CBGs 101-150: educated patient regarding goal CBGs and benefits of no added sugar in diet.  12/13- BGs 91-141- con't regimen   12/14- has spike up to 192, but otherwise controlled  12/15- BGs 100-140- doing well- con't regimen 11. Now ESRD: Now on HD MWF (set up at [facility] post discharge)  12. Anemia of chronic disease/iron deficiency: Hgb-6.3 at admission--->9.1 s/p 4 units PRBC. Feraheme X 1 (additional dose held due to infection) and Aranesp weekly.   Hemoglobin 9.7 on 11/26, repeat CBC ordered for Monday.   12/13- Hb 10.1- improving  Continue to monitor 13. HTN: Monitor BP tid--continue Coreg bid, Norvasc and apresoline tid.   Decreased Coreg to 12.35m BID.   193/23 diastolic soft and pulse bradycardic: decrease coreg to 6.268mBID.  14. IBS-D:   MiraLAX DC'd  Resumed  Lotrenex    Colace as needed ordered  12/2- will make colace scheduled and add sorbitol if no BM by 3pm  12/4 changed colace to senna-s given addn of MS Contin  12/6- refusing laxatives- but had BM Saturday and this AM, so con't regimen  12/13- BMs x2 over weekend- insisted we stop laxatives- won't take.   12/14- LBM 2 days ago, but feel slike could go today.   12/15- refusing ALL laxatives- due to IBS with diarrhea, however no BM since Sunday- explained if doesn't go today, will need to take something 15. Malnutrition: Continue protein supplement.  16. Abdominal pain: Due to diarrhea? PPI resumed-->patient to have family bring in dePomonarom home.  17.  Sleep disturbance:   Low dose elavil at bedtime.  Melatonin added on 11/26  Improving overall 18. Leukocytosis:   WBCs 11.6 on 11/26, labs with HD  See #8, #9  Afebrile  12/3- WBC 11.1- afebrile; no Sx's- con't to monitor  12/6- WBC 11.3- cause??  12/7- WBC 12.0- due to abscesses in L thigh, glute, etc causing edema- have contacted Ortho AND ID- waiting to hear back.   12/9- ID is calling IR to get abscesses drained.  12/10- IR doing IR drainage today  12/14- 40 cc purulent drainage- no growth to date.  19. LE/thigh edema  12/6- will check CT of abd/pelvis with contrast to determine cause-got OK by nephrology to use contrast- has 2-3+ edema of thighs L>R  12/7- due to rim enhancing lesions- as above.   12/8- will con't Ertepenem- per ID.   12/13- s/p IR- WBC 10.4- con't Ertepenem 20. Depression-new-  12/8- will add Celexa 20 mg daily- max DOSE  12/9- had to decrease Duloxetine to 30 mg daily.    12/15- slightly more interactive   LOS: 23 days A FACE TO FACE EVALUATION WAS PERFORMED  Shaneka Efaw 02/15/2020, 8:32 AM

## 2020-02-16 ENCOUNTER — Inpatient Hospital Stay (HOSPITAL_COMMUNITY): Payer: Medicaid Other | Admitting: Occupational Therapy

## 2020-02-16 ENCOUNTER — Inpatient Hospital Stay (HOSPITAL_COMMUNITY): Payer: Medicaid Other

## 2020-02-16 ENCOUNTER — Inpatient Hospital Stay (HOSPITAL_COMMUNITY): Payer: Medicaid Other | Admitting: Physical Therapy

## 2020-02-16 LAB — GLUCOSE, CAPILLARY
Glucose-Capillary: 101 mg/dL — ABNORMAL HIGH (ref 70–99)
Glucose-Capillary: 133 mg/dL — ABNORMAL HIGH (ref 70–99)
Glucose-Capillary: 144 mg/dL — ABNORMAL HIGH (ref 70–99)
Glucose-Capillary: 215 mg/dL — ABNORMAL HIGH (ref 70–99)

## 2020-02-16 MED ORDER — SORBITOL 70 % SOLN
30.0000 mL | Freq: Once | Status: DC
  Filled 2020-02-16: qty 30

## 2020-02-16 NOTE — Progress Notes (Signed)
[place] PHYSICAL MEDICINE & REHABILITATION PROGRESS NOTE   Subjective/Complaints:  Pt reports actually did have a small BM Tuesday evening, but feels like would go today- I explained we really need to get her bowels working- it's Thursday.   Vascular took 1/2 of L BKA sutures out this Am and will f/u in 2 weeks in office to remove rest.   ROS:   Pt denies SOB, abd pain, CP, N/V/C/D, and vision changes   Objective:   No results found. Recent Labs    02/15/20 1443  WBC 11.5*  HGB 10.0*  HCT 33.4*  PLT 247   Recent Labs    02/15/20 1443  NA 133*  K 4.1  CL 96*  CO2 27  GLUCOSE 155*  BUN 38*  CREATININE 6.25*  CALCIUM 8.9    Intake/Output Summary (Last 24 hours) at 02/16/2020 0842 Last data filed at 02/16/2020 5643 Gross per 24 hour  Intake 560 ml  Output 3500 ml  Net -2940 ml     Pressure Injury 01/23/20 Buttocks Left Deep Tissue Pressure Injury - Purple or maroon localized area of discolored intact skin or blood-filled blister due to damage of underlying soft tissue from pressure and/or shear. Circular, dark, red area, surrounde (Active)  01/23/20 1609  Location: Buttocks  Location Orientation: Left  Staging: Deep Tissue Pressure Injury - Purple or maroon localized area of discolored intact skin or blood-filled blister due to damage of underlying soft tissue from pressure and/or shear.  Wound Description (Comments): Circular, dark, red area, surrounded by MASD  Present on Admission: Yes    Physical Exam: Vital Signs Blood pressure (!) 143/47, pulse 61, temperature 98.3 F (36.8 C), resp. rate 17, height '5\' 10"'  (1.778 m), weight 85 kg, SpO2 96 %. Gen: awake, alert, appropriate, sitting up in bed- watching TV, NAD HEENT: oral mucosa pink and moist, NCAT Cardio: .RRR Chest: CTA B/L- no W/R/R- good air movement Abd: Soft, NT, ND, (+)BS -hypoactive still Ext: no edema Skin: intact Psych: flat affect, slightly more interactive Skin: Warm and dry.  L BKA-   Well shaped with shrinker- 1/2 of staples removed- looks good.    L hip looks great- sutures out-steristrips-  L thigh with no swelling or drainage Musc: Left BKA with mild edema and tenderness LLE less swollen/edematous-  Neuro: Alert Motor: Right lower extremity: Hip flexion, knee extension 4-5/5 (pain inhibition), ankle dorsiflexion 4+/5, stable exam Left lower extremity: Hip flexion, knee extension 3/5 (some pain inhibition), ankle dorsiflexion 4/5   Assessment/Plan: 1. Functional deficits which require 3+ hours per day of interdisciplinary therapy in a comprehensive inpatient rehab setting.  Physiatrist is providing close team supervision and 24 hour management of active medical problems listed below.  Physiatrist and rehab team continue to assess barriers to discharge/monitor patient progress toward functional and medical goals  Care Tool:  Bathing    Body parts bathed by patient: Right arm,Left arm,Chest,Abdomen,Face,Right upper leg,Left upper leg,Right lower leg,Left lower leg,Buttocks   Body parts bathed by helper: Buttocks,Right lower leg Body parts n/a: Left lower leg   Bathing assist Assist Level: Contact Guard/Touching assist     Upper Body Dressing/Undressing Upper body dressing   What is the patient wearing?: Pull over shirt    Upper body assist Assist Level: Set up assist    Lower Body Dressing/Undressing Lower body dressing      What is the patient wearing?: Pants     Lower body assist Assist for lower body dressing: Maximal Assistance - Patient  25 - 49% (from sit to stand level today)     Naval architect Activity did not occur Landscape architect and hygiene only): N/A (no void or bm)  Toileting assist Assist for toileting: Maximal Assistance - Patient 25 - 49% Assistive Device Comment: female urinal   Transfers Chair/bed transfer  Transfers assist  Chair/bed transfer activity did not occur: Safety/medical concerns  Chair/bed  transfer assist level: Supervision/Verbal cueing Chair/bed transfer assistive device: Sliding board   Locomotion Ambulation   Ambulation assist   Ambulation activity did not occur: Safety/medical concerns  Assist level: Moderate Assistance - Patient 50 - 74% Assistive device: Parallel bars Max distance: 3'   Walk 10 feet activity   Assist  Walk 10 feet activity did not occur: Safety/medical concerns        Walk 50 feet activity   Assist Walk 50 feet with 2 turns activity did not occur: Safety/medical concerns         Walk 150 feet activity   Assist Walk 150 feet activity did not occur: Safety/medical concerns         Walk 10 feet on uneven surface  activity   Assist Walk 10 feet on uneven surfaces activity did not occur: Safety/medical concerns         Wheelchair     Assist Will patient use wheelchair at discharge?: Yes Type of Wheelchair: Manual    Wheelchair assist level: Supervision/Verbal cueing Max wheelchair distance: 300'    Wheelchair 50 feet with 2 turns activity    Assist        Assist Level: Supervision/Verbal cueing   Wheelchair 150 feet activity     Assist      Assist Level: Supervision/Verbal cueing   Medical Problem List and Plan: 1.  Impaired mobility and ADLs secondary to L BKA  Continue CIR 2.  Antithrombotics: -DVT/anticoagulation:  Pharmaceutical: Lovenox             -antiplatelet therapy: N/A 3. Chronic pain/Pain Management: Was on oxycodone 5 mg tid prn as well Cymbalta for neuropathy (at baseline)- now on 10-15 mg q4 hours prn  12/2- will start MS Contin 15 mg BID  [facility]-5[facility] improved pain for pt- con't MS Contin  12/9- pain a little worse today- change flexeril to TID prn  12/15- pain controlled when takes meds- con't regimen 4. Mood: Team to provide ego support/encouragement.              -antipsychotic agents: N/A 5. Neuropsych: This patient is capable of making decisions on her own  behalf. 6. Skin/Wound Care: Monitor wound daily   Continue stump shrinker   12/15- will call Vascular again and see if we can remove staples- we called- they said they would come see pt.   12/16- took out 1/2 staples- will remove rest in 2 weeks at f/u.  7. Fluids/Electrolytes/Nutrition: Strict I/O. 1200 cc FR. Will consult dietician to educate on renal diet.  8. Left hip ORIF/hip abscess s/p I&D 11/18: NWB LLE.   Flagyl changed to Ertepenem due to patient refusal, continue  12/1- on Ertepenem until end of December.  12/7- pt has new rim enhancing lesions in L hip, thigh/glute, etc- have called Ortho and also calling ID to discuss- might have been since switched off Flagyl?   12/9- ID suggested going to IR to get abscesses drained- WBC is still 12.1k- con't Ertepenem  12/10- IR today for draining abscesses  12/11: CBC ordered for Monday.   12/13- WBC down to  10.4  12/16- WBC 11.5- is back up some- will check CRP and ESR to see what else might be going on.  9. 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  10. T1DM with neuropathy: Hgb A1c-6.2. Was on Lantus 50 units in am with 10 units novolog tid.     Lantus 38 twice daily, decreased to 10 on 11/27, increased to 15 on 11/28  NovoLog 5 units with breakfast, 8 units with lunch and dinner, decreased to 5 3 times daily on 11/27, DC'd on 11/27.  12/8- Had hypoglycemia x1- of 69- con't regimen for now  12/12: CBGs 101-150: educated patient regarding goal CBGs and benefits of no added sugar in diet.  12/16- BGs 97-168- doing better- con't regimen 11. Now ESRD: Now on HD MWF (set up at [facility] post discharge)  12. Anemia of chronic disease/iron deficiency: Hgb-6.3 at admission--->9.1 s/p 4 units PRBC. Feraheme X 1 (additional dose held due to infection) and  Aranesp weekly.   Hemoglobin 9.7 on 11/26, repeat CBC ordered for Monday.   12/13- Hb 10.1- improving  Continue to monitor 13. HTN: Monitor BP tid--continue Coreg bid, Norvasc and apresoline tid.   Decreased Coreg to 12.43m BID.   193/79 diastolic soft and pulse bradycardic: decrease coreg to 6.26mBID.  14. IBS-D:   MiraLAX DC'd  Resumed Lotrenex    Colace as needed ordered  12/2- will make colace scheduled and add sorbitol if no BM by 3pm  12/4 changed colace to senna-s given addn of MS Contin  12/15- refusing ALL laxatives- due to IBS with diarrhea, however no BM since Sunday- explained if doesn't go today, will need to take something  12/16- Actually said had small BM Tuesday, but "needs to go today"- which I've heard for 3 days in a row- if no BM by 5pm, will order sorbitol.  15. Malnutrition: Continue protein supplement.  16. Abdominal pain: Due to diarrhea? PPI resumed-->patient to have family bring in deJoesrom home.  17.  Sleep disturbance:   Low dose elavil at bedtime.  Melatonin added on 11/26  Improving overall 18. Leukocytosis:   WBCs 11.6 on 11/26, labs with HD  See #8, #9  Afebrile  12/3- WBC 11.1- afebrile; no Sx's- con't to monitor  12/6- WBC 11.3- cause??  12/7- WBC 12.0- due to abscesses in L thigh, glute, etc causing edema- have contacted Ortho AND ID- waiting to hear back.   12/9- ID is calling IR to get abscesses drained.  12/10- IR doing IR drainage today  12/14- 40 cc purulent drainage- no growth to date.  19. LE/thigh edema  12/6- will check CT of abd/pelvis with contrast to determine cause-got OK by nephrology to use contrast- has 2-3+ edema of thighs L>R  12/7- due to rim enhancing lesions- as above.   12/8- will con't Ertepenem- per ID.   12/13- s/p IR- WBC 10.4- con't Ertepenem 20. Depression-new-  12/8- will add Celexa 20 mg daily- max DOSE  12/9- had to decrease Duloxetine to 30 mg daily.    12/15- slightly more interactive   LOS: 24  days A FACE TO FACE EVALUATION WAS PERFORMED  Arihanna Estabrook 02/16/2020, 8:42 AM

## 2020-02-16 NOTE — Progress Notes (Signed)
Subjective:     Patient is 4 weeks post-op S/P L BKA.  She denies any pain.  She reports that she has been working with therapy and has tolerated her stay well.  She is utilizing her stump shrinker sock.   Objective:   VITALS:  Temp:  [97.7 F (36.5 C)-98.9 F (37.2 C)] 98.3 F (36.8 C) (12/16 0538) Pulse Rate:  [58-62] 61 (12/16 0538) Resp:  [15-18] 17 (12/16 0538) BP: (104-143)/(47-66) 143/47 (12/16 0538) SpO2:  [96 %-100 %] 96 % (12/16 0538) Weight:  [84.6 kg-88.3 kg] 85 kg (12/16 0538)  General: WDWN patient in NAD. Psych:  Appropriate mood and affect. Neuro:  A&O x 3, Moving all extremities, sensation intact to light touch HEENT:  EOMs intact Chest:  Even non-labored respirations Skin:  Incision C/D/I, sutures are intact, residual scabbing along the incision site is noted,  no rashes or lesions Extremities: warm/dry, no edema, erythema or echymosis.  No lymphadenopathy. Pulses: Popliteus 2+ MSK:  ROM: TKE, MMT: able to perform quad set    LABS Recent Labs    02/15/20 1443  HGB 10.0*  WBC 11.5*  PLT 247   Recent Labs    02/15/20 1443  NA 133*  K 4.1  CL 96*  CO2 27  BUN 38*  CREATININE 6.25*  GLUCOSE 155*   No results for input(s): LABPT, INR in the last 72 hours.   Assessment/Plan:     S/P L BKA  NWB L LE Continue use of stump shrinker sock and stump protector Every other suture was removed today.   Plan to see the patient in the office in 2 weeks for a wound check and removal of the remaining sutures.  Mechele Claude PA-C [facility] Office:  [phone]

## 2020-02-16 NOTE — Progress Notes (Addendum)
Recreational Therapy Session Note  Patient Details  Name: WARREN KUGELMAN MRN: [id number] Date of Birth: Mar 18, 1965 Today's Date: 02/16/2020  Pain: no c/o Skilled Therapeutic Interventions/Progress Updates: Session focused on activity tolerance & dynamic standing balance during co-treat with PT.  Pt stood with RW and +2 for safety for horseshoe game.  Pt stood reaching to hips or knees to simulate pulling pants up in standing. Pt required min- mod assist for balance with 1 UE support during horseshoe gameplay.  Also discussed pt progress, reduced feelings of stress and discharge planning.  Pt performs w/c mobility with Mod I and is Mod I for seated tasks when items within reach.  Pt also Mod I with relaxation strategies. Dixon 02/16/2020, 1:22 PM

## 2020-02-16 NOTE — Progress Notes (Signed)
ID Pharmacy Note   Received a phone call from [facility] yesterday- Unfortunately they do not have ertapenem on their formulary for the dialysis center. I discussed with Dr. Juleen China, and we will continue ertapenem while inpatient and switch to Cefepime 2 gm with each dialysis session when she is discharged. Treatment will continue through 03/01/20.   Plan Continue ertapenem while inpatient Switch to Cefepime 2 gm with HD at discharge Will discuss with dialysis center   Jimmy Footman, PharmD, BCPS, [facility] Infectious Diseases Clinical Pharmacist Phone: [phone] 02/16/2020 8:35 AM

## 2020-02-16 NOTE — Progress Notes (Signed)
Patient ID: [name], female   DOB: 1965/09/14, 54 y.o.   MRN: [id number] Met with pt who reports her daughter can come in Monday at 10:00 for education. Should be able to get from 10:00-12:00 for both PT and OT and then will have HD in the afternoon. Work toward discharge 12/21.

## 2020-02-16 NOTE — Progress Notes (Signed)
Occupational Therapy Session Note  Patient Details  Name: [name] MRN: [id number] Date of Birth: 01-24-1966  Today's Date: 02/16/2020 OT Individual Time: 1300-1415 OT Individual Time Calculation (min): 75 min    Short Term Goals: Week 4:  OT Short Term Goal 1 (Week 4): STGs = LTGs d/t ELOS  Skilled Therapeutic Interventions/Progress Updates:    Patient greeted semi-reclined in bed and agreeable to OT Treatment session. Pt completed bed mobility with supervision, then was able to remove arm rest, place slideboard, and complete slideboard transfer with set-up/supervision. Pt propelled wc to the sink and completed UB ADLs and grooming tasks in sitting. OT educated on community access and barrierrs at [employer] level. OT educated on accessing elevator from wc and practiced propelled wc outside on uneven surfaces. UB there-ex with 3 sets of 10 chest pull, triceps press, lat pull, and bicep curls using level 4 blue theraband. Pt propelled wc back to room and worked on sit<>stand at the sink with mod A, then pt able to maintain standing with min A for 2 minutes. Pt completed slideboard transfer back to bed with supervision. Pt left semi-reclined in bed with bed alarm on, call bell in reach, and needs met.   Therapy Documentation Precautions:  Precautions Precautions: Fall Precaution Comments: new L BKA Required Braces or Orthoses: Other Brace Other Brace: L LE limb guard Restrictions Weight Bearing Restrictions: Yes LLE Weight Bearing: Non weight bearing' \Pain' : Pain Assessment Pain Scale: 0-10 Pain Score: 5  Pain Type: Acute pain Pain Location: Hip Pain Orientation: Left Pain Descriptors / Indicators: Aching;Discomfort Pain Frequency: Intermittent Pain Onset: On-going Patients Stated Pain Goal: 2 Pain Intervention(s): Repositioned Multiple Pain Sites: No   Therapy/Group: Individual Therapy  Valma Cava 02/16/2020, 2:18 PM

## 2020-02-16 NOTE — Progress Notes (Signed)
[facility] NEPHROLOGY PROGRESS NOTE  Assessment/ Plan: Pt is a 54 y.o. yo female with new ESRD, left foot infection is status post a BKA, left hip fracture status post ORIF, now in CIR.  Physical Exam: General: Not in distress, comfortable Heart:RRR, s1s2 nl Lungs: Clear b/l, no crackle Abdomen:soft, Non-tender, non-distended Extremities: RLE edema+, left BKA Dialysis Access: Right IJ TDC, maturing left AV fistula.   Problems: # AoCKD 5,now ESRD: Baseline Creat 4.9- 5.2, eGFR 9-11 from 8/21 - 10/21. Creat 6 on admission, then mid 7's, IVF's given w/o improvementso started HD.Has been clipped to [facility].S/p [facility] and AVF on11/8, 1st HD 11/9. Continue MWF schedule here. HD tomorrow.   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 S/p 4U PRBCs.Tsat low 10%, got 532m Feraheme on 11/6, holding further doses due to infection. IncreasedAranesp 60 to 100 q week on 11/17.  Monitor hemoglobin.  #L foot stump chronic open wound w/ poss osteo by plain films-> s/pL BKA 01/12/20  #L hip fracture - admission diagnosis.S/p ORIF on 10/28 per ortho.F/b ID. Complicated by wound infection with Enterobacter cloacae and Bacteroides.  Status post I&D 11/18.  Currently on antibiotics for planned 6-week course through 12/30. Further complicated by abscess in the left hip status post IR aspiration 12/10 with 40 cc of purulent fluid.  Cultures no growth to date.  #HTN/ vol -lowered amlodipine dose.  Also on hydralazine, coreg and norvasc.  UF during HD.  #BMM -monitor calcium and phosphorus level.  Continue sevelamer.  PTH 44.   #Hyponatremia, hypervolemic: She has high intradialytic weight gain.  Discussed about fluid restriction.  UF as tolerated.  #Nutrition: albumin very low, on protein supplement.Renal diet w/fluid restrictions.  RKelly Splinter MD 02/16/2020, 2:37 PM      Subjective: Seen and examined.  Doing well.  Denies nausea vomiting chest pain shortness of  breath.  Objective Vital signs in last 24 hours: Vitals:   02/15/20 1700 02/15/20 1953 02/16/20 0538 02/16/20 1257  BP: 109/63 (!) 105/47 (!) 143/47 (!) 139/48  Pulse: 62 (!) 58 61 64  Resp: _0 Temp: 97.7 F (36.5 C) 98.9 F (37.2 C) 98.3 F (36.8 C) 98.6 F (37 C)  TempSrc: Oral   Oral  SpO2: 100% 98% 96% 98%  Weight: 84.6 kg  85 kg   Height:       Weight change: -1.5 kg  Intake/Output Summary (Last 24 hours) at 02/16/2020 1437 Last data filed at 02/16/2020 1333 Gross per 24 hour  Intake 660 ml  Output 3500 ml  Net -2840 ml       Labs: Basic Metabolic Panel: Recent Labs  Lab 02/10/20 1716 02/13/20 0632 02/15/20 1443  NA 131* 129* 133*  K 3.6 4.7 4.1  CL 92* 92* 96*  CO2 _1 GLUCOSE 82 90 155*  BUN 34* 43* 38*  CREATININE 6.56* 7.18* 6.25*  CALCIUM 8.6* 9.1 8.9  PHOS 4.0  --  3.8   Liver Function Tests: Recent Labs  Lab 02/10/20 1716 02/15/20 1443  ALBUMIN 2.1* 2.3*   No results for input(s): LIPASE, AMYLASE in the last 168 hours. No results for input(s): AMMONIA in the last 168 hours. CBC: Recent Labs  Lab 02/10/20 1716 02/13/20 0632 02/15/20 1443  WBC 11.5* 10.4 11.5*  HGB 9.4* 10.1* 10.0*  HCT 31.7* 34.1* 33.4*  MCV 91.4 90.0 90.0  PLT 223 251 247   Cardiac Enzymes: No results for input(s): CKTOTAL, CKMB, CKMBINDEX, TROPONINI in the last 168 hours.  CBG: Recent Labs  Lab 02/15/20 1138 02/15/20 1749 02/15/20 2110 02/16/20 0608 02/16/20 1136  GLUCAP 97 168* 158* 101* 133*    Iron Studies: No results for input(s): IRON, TIBC, TRANSFERRIN, FERRITIN in the last 72 hours. Studies/Results: No results found.  Medications: Infusions: . sodium chloride 250 mL (02/10/20 1842)  . ertapenem 500 mg (02/15/20 1754)    Scheduled Medications: . (feeding supplement) PROSource Plus  30 mL Oral BID BM  . alosetron  1 mg Oral BID  . amitriptyline  10 mg Oral QHS  . amLODipine  5 mg Oral Daily  . atorvastatin  40 mg Oral  q1800  . carvedilol  6.25 mg Oral BID WC  . Chlorhexidine Gluconate Cloth  6 each Topical Q0600  . Chlorhexidine Gluconate Cloth  6 each Topical Q0600  . citalopram  20 mg Oral Daily  . darbepoetin (ARANESP) injection - DIALYSIS  100 mcg Intravenous Q Mon-HD  . DULoxetine  30 mg Oral Daily  . enoxaparin (LOVENOX) injection  30 mg Subcutaneous Q24H  . heparin sodium (porcine)  1,000 Units Intravenous Q M,W,F-HD  . hydrALAZINE  100 mg Oral TID  . insulin aspart  0-5 Units Subcutaneous QHS  . insulin aspart  0-9 Units Subcutaneous TID WC  . insulin glargine  15 Units Subcutaneous QHS  . melatonin  1.5 mg Oral QHS  . morphine  15 mg Oral Q12H  . multivitamin  1 tablet Oral QHS  . pantoprazole  40 mg Oral Daily  . sevelamer carbonate  1,600 mg Oral TID WC  . sorbitol  30 mL Oral Once    have reviewed scheduled and prn medications.

## 2020-02-16 NOTE — Progress Notes (Signed)
Physical Therapy Session Note  Patient Details  Name: [name] MRN: [id number] Date of Birth: [birth year]  Today's Date: 02/16/2020 PT Individual Time: 5621-3086 PT Individual Time Calculation (min): 75 min   Short Term Goals: Week 4:  PT Short Term Goal 1 (Week 4): STG=LTG due to ELOS  Skilled Therapeutic Interventions/Progress Updates:    Patient in supine and agreeable to PT.  Reports other PT placed legrest on her chair.  Patient supine to sit with S and transferred via slide board to w/c with S.  Noting needing increased verbal cues throughout session for wheelchair management as still learning new chair.  Patient reports still needs to confirm with daughter when she will come for caregiver education.  Propelled in w/c to general gym.  Co-session with TR for 30 minutes practiced sit to stand x 1 from height of w/c then x 3 from chair with aeromat for improved energy conservation and ease of transfers.  Without mat to RW with +2 for safety.  Able to hop forward with RW and +2 for safety x 6' x 2 trials.  Patient performed standing balance activity reaching to hips or knees for horseshoes to simulate pulling up pants, then tossing to staub using 1 UE support and min to mod A for balance.  Patient transferred to mat using slide board with S.  Seated balance tossing and catching ball with S for safety.  Patient performed slide board transfer to w/c with S.  Propelled to room mod I.  SBT back to bed with S.  Left with all needs in reach and bed alarm active.   Therapy Documentation Precautions:  Precautions Precautions: Fall Precaution Comments: new L BKA Required Braces or Orthoses: Other Brace Other Brace: L LE limb guard Restrictions Weight Bearing Restrictions: Yes LLE Weight Bearing: Non weight bearing Pain: Pain Assessment Pain Score: 8  Pain Type: Acute pain Pain Location: Hip Pain Orientation: Left Pain Descriptors / Indicators: Discomfort;Aching Pain Onset:  On-going Pain Intervention(s): Rest    Therapy/Group: Individual Therapy  Reginia Naas  Magda Kiel, PT 02/16/2020, 12:22 PM

## 2020-02-16 NOTE — Progress Notes (Signed)
Patient ID: [name], female   DOB: [birth year], 54 y.o.   MRN: [id number]      Diagnosis codes:S88.112 A, N18.6 and E10.65  Height:   5'10             Weight:   190 lbs         Patient suffers from L-BKA   which impairs their ability to perform daily activities like ADL's and tolieting   in the home.  A walker  will not resolve issue with performing activities of daily living.  A wheelchair will allow patient to safely perform daily activities.  Patient is not able to propel themselves in the home using a standard weight wheelchair due to endurance and strength .  Patient can self propel in the lightweight wheelchair. Length of need 99 months

## 2020-02-16 NOTE — Progress Notes (Signed)
Physical Therapy Session Note  Patient Details  Name: [name] MRN: [id number] Date of Birth: Jul 19, 1965  Today's Date: 02/16/2020 PT Individual Time: 8110-3159 PT Individual Time Calculation (min): 40 min   Short Term Goals: Week 4:  PT Short Term Goal 1 (Week 4): STG=LTG due to ELOS  Skilled Therapeutic Interventions/Progress Updates:    Pt received on bed pan with nursing present. Handoff to PT. Pt performed rolling to L with CGA for PT to perform pericare total assist. Pt returned to supine and donned pants with supervision. Pt states that her pain is an 8/10. Pt agreeable to bed level exercises until pain decreases. Pain medication administered prior to session.  Therex Bilaterally 2x10 each -SLR -Hip Abduction -modified bridge, PT supporting LLE  PT performed wc management of loaner wc to allow for LLE support through amputee pad. Pt performed slide board transfer with CGA to wc on R. Pt propelled wc ~252f with supervision. Pt reports that her LLE feels much more supported at this time. Pt returned to room and performed slide board transfer to R with CGA. Sit>supine with supervision. Pt left supine in bed with HOB raised slightly, LUE/LE supported, call bell in reach, needs met, and bed alarm on.   Therapy Documentation Precautions:  Precautions Precautions: Fall Precaution Comments: new L BKA Required Braces or Orthoses: Other Brace Other Brace: L LE limb guard Restrictions Weight Bearing Restrictions: Yes LLE Weight Bearing: Non weight bearing    Therapy/Group: Individual Therapy  BGaylord Shih12/16/2021, 12:05 PM

## 2020-02-17 ENCOUNTER — Inpatient Hospital Stay (HOSPITAL_COMMUNITY): Payer: Medicaid Other | Admitting: Occupational Therapy

## 2020-02-17 ENCOUNTER — Inpatient Hospital Stay (HOSPITAL_COMMUNITY): Payer: Medicaid Other

## 2020-02-17 LAB — CBC
HCT: 34.8 % — ABNORMAL LOW (ref 36.0–46.0)
Hemoglobin: 10.4 g/dL — ABNORMAL LOW (ref 12.0–15.0)
MCH: 27 pg (ref 26.0–34.0)
MCHC: 29.9 g/dL — ABNORMAL LOW (ref 30.0–36.0)
MCV: 90.4 fL (ref 80.0–100.0)
Platelets: 302 10*3/uL (ref 150–400)
RBC: 3.85 MIL/uL — ABNORMAL LOW (ref 3.87–5.11)
RDW: 15.9 % — ABNORMAL HIGH (ref 11.5–15.5)
WBC: 12 10*3/uL — ABNORMAL HIGH (ref 4.0–10.5)
nRBC: 0 % (ref 0.0–0.2)

## 2020-02-17 LAB — RENAL FUNCTION PANEL
Albumin: 2.5 g/dL — ABNORMAL LOW (ref 3.5–5.0)
Anion gap: 10 (ref 5–15)
BUN: 39 mg/dL — ABNORMAL HIGH (ref 6–20)
CO2: 27 mmol/L (ref 22–32)
Calcium: 8.9 mg/dL (ref 8.9–10.3)
Chloride: 94 mmol/L — ABNORMAL LOW (ref 98–111)
Creatinine, Ser: 6.2 mg/dL — ABNORMAL HIGH (ref 0.44–1.00)
GFR, Estimated: 8 mL/min — ABNORMAL LOW (ref >60)
Glucose, Bld: 119 mg/dL — ABNORMAL HIGH (ref 70–99)
Phosphorus: 3.6 mg/dL (ref 2.5–4.6)
Potassium: 3.9 mmol/L (ref 3.5–5.1)
Sodium: 131 mmol/L — ABNORMAL LOW (ref 135–145)

## 2020-02-17 LAB — GLUCOSE, CAPILLARY
Glucose-Capillary: 88 mg/dL (ref 70–99)
Glucose-Capillary: 104 mg/dL — ABNORMAL HIGH (ref 70–99)
Glucose-Capillary: 121 mg/dL — ABNORMAL HIGH (ref 70–99)
Glucose-Capillary: 281 mg/dL — ABNORMAL HIGH (ref 70–99)

## 2020-02-17 MED ORDER — HEPARIN SODIUM (PORCINE) 1000 UNIT/ML DIALYSIS: 2000.0000 [IU] | INTRAMUSCULAR | Status: DC | PRN

## 2020-02-17 MED ORDER — HEPARIN SODIUM (PORCINE) 1000 UNIT/ML IJ SOLN
INTRAMUSCULAR | Status: AC
  Administered 2020-02-17: 4000 [IU]
  Filled 2020-02-17: qty 4

## 2020-02-17 MED ORDER — OXYCODONE HCL 5 MG PO TABS
10.0000 mg | ORAL_TABLET | ORAL | Status: DC | PRN
  Administered 2020-02-17: 10 mg via ORAL
  Filled 2020-02-17: qty 2

## 2020-02-17 NOTE — Progress Notes (Signed)
Occupational Therapy Session Note  Patient Details  Name: [name] MRN: [id number] Date of Birth: [birth year]  Today's Date: 02/17/2020 OT Individual Time: 0700-0757 OT Individual Time Calculation (min): 57 min    Short Term Goals: Week 3:  OT Short Term Goal 1 (Week 3): Pt will perform sit to stand Mod A in prep for LB ADL OT Short Term Goal 1 - Progress (Week 3): Met OT Short Term Goal 2 (Week 3): Pt will perform clothing management with lateral leans on BSC with no more than Mod A OT Short Term Goal 2 - Progress (Week 3): Progressing toward goal OT Short Term Goal 3 (Week 3): Pt will perform BSC transfers with supervision OT Short Term Goal 3 - Progress (Week 3): Progressing toward goal OT Short Term Goal 4 (Week 3): Pt will perform 10 minute ADL/activity of choice without fatigue or rest break OT Short Term Goal 4 - Progress (Week 3): Met Week 4:  OT Short Term Goal 1 (Week 4): STGs = LTGs d/t ELOS  Skilled Therapeutic Interventions/Progress Updates:     Pt received in bed agreeable to OT 8/10 pain in hip. Premedicated byRN just before session ADL:  Pt completes bathing with set up on EOB with lateral leans to wash buttocks Pt completes UB dressing with set up at EOB Pt completes LB dressing with set up using lateral leans to manage pants with OT laying bed flat for increased surface area to lean Pt completes footwear with S to don sock Pt completes grooming seated in w/c at sink for energy conservation for oral care and hair brushing with MOD I. Set up for transfer into w/c with pt managing all parts/placing board with cuing only to watch pants on brake extender as may get caught mid transfer  Edu re bathroom access as removing door from hinges may give pt access in w/c. Encouraged pt to have pts daughter measure doorwary into bathroom to even see if w/c would fit in to allow for TTB transfer.  Therapeutic exercise Pt completes 2x10-15 dowel rod therex for BUE shoulder  strengthening required for BADLs/functional transfers as follows with demo cuing and 4 # dowel rod  Shoulder flex/ext, shoulder press, horizonal ab/adduct Circles in B directions Elbow flex/ext, chest press  Pt left at end of session in bed with exit alarm on, call light in reach and all needs met   Therapy Documentation Precautions:  Precautions Precautions: Fall Precaution Comments: new L BKA Required Braces or Orthoses: Other Brace Other Brace: L LE limb guard Restrictions Weight Bearing Restrictions: Yes LLE Weight Bearing: Non weight bearing General:   Vital Signs: Therapy Vitals Temp: 97.9 F (36.6 C) Pulse Rate: 60 Resp: 18 BP: (!) 151/52 Patient Position (if appropriate): Lying Oxygen Therapy SpO2: 95 % O2 Device: Room Air Pain: Pain Assessment Pain Score: 6  ADL: ADL Upper Body Bathing: Contact guard Lower Body Bathing: Maximal assistance Where Assessed-Lower Body Bathing: Bed level Upper Body Dressing: Minimal assistance Where Assessed-Upper Body Dressing: Edge of bed Lower Body Dressing: Dependent Where Assessed-Lower Body Dressing: Bed level Toileting: Not assessed (incontinent of bowel, did not need to urinate) Vision   Perception    Praxis   Exercises:   Other Treatments:     Therapy/Group: Individual Therapy  Tonny Branch 02/17/2020, 6:57 AM

## 2020-02-17 NOTE — Progress Notes (Signed)
Renal Navigator met with patient at HD bedside to review plan for HD clinic start on Wednesday, 02/22/20. Patient in good spirits and aware of her plan. She states no questions, concerns or needs at this time.  Alphonzo Cruise, [place] Renal Navigator [phone]

## 2020-02-17 NOTE — Procedures (Signed)
   I was present at this dialysis session, have reviewed the session itself and made  appropriate changes [name] MD [facility] pager [phone]   02/17/2020, 2:31 PM

## 2020-02-17 NOTE — Progress Notes (Signed)
Occupational Therapy Session Note  Patient Details  Name: [name] MRN: [id number] Date of Birth: Jun 29, 1965  Today's Date: 02/17/2020 OT Individual Time: 1105-1200 OT Individual Time Calculation (min): 55 min   Skilled Therapeutic Interventions/Progress Updates:    Pt greeted in the bed, already completed ADLs this morning. She did not want to review/practice BSC or shower transfers in prep for upcoming d/c, stated she felt comfortable with these ADL areas. Pt requested lighter tx to take it easy before HD today. Therefore OT printed her out an UE HEP focusing on UB strengthening using blue tband/3# dumbbells and also active stretching. Pt followed exercises on her handout with guided instruction and HOB elevated. Education provided regarding importance of incorporating exercise into her daily routine post d/c for maintaining/improving current functional level. Pt receptive to education. She remained in bed at close of session, left with all needs within reach and bed alarm set.  Therapy Documentation Precautions:  Precautions Precautions: Fall Precaution Comments: new L BKA Required Braces or Orthoses: Other Brace Other Brace: L LE limb guard Restrictions Weight Bearing Restrictions: Yes LLE Weight Bearing: Non weight bearing Pain: RN notified of pts request for pain medicine before she heads down to HD Pain Assessment Pain Scale: 0-10 Pain Score: 9  Faces Pain Scale: Hurts even more Pain Type: Acute pain Pain Location: Hip Pain Orientation: Left Pain Radiating Towards: back Pain Descriptors / Indicators: Aching Pain Frequency: Constant Pain Onset: On-going Patients Stated Pain Goal: 3 Pain Intervention(s): Rest Multiple Pain Sites: No ADL: ADL Upper Body Bathing: Contact guard Lower Body Bathing: Maximal assistance Where Assessed-Lower Body Bathing: Bed level Upper Body Dressing: Minimal assistance Where Assessed-Upper Body Dressing: Edge of bed Lower Body  Dressing: Dependent Where Assessed-Lower Body Dressing: Bed level Toileting: Not assessed (incontinent of bowel, did not need to urinate)     Therapy/Group: Individual Therapy  Chadrick Sprinkle A Yumna Ebers 02/17/2020, 12:23 PM

## 2020-02-17 NOTE — Progress Notes (Signed)
[facility] PHYSICAL MEDICINE & REHABILITATION PROGRESS NOTE   Subjective/Complaints: No complaints this morning. Denies pain, constipation. Slept poorly last night, but prior to that she had been sleeping well with Amitriptyline.   ROS:   Pt denies SOB, abd pain, CP, N/V/C/D, and vision changes   Objective:   No results found. Recent Labs    02/15/20 1443  WBC 11.5*  HGB 10.0*  HCT 33.4*  PLT 247   Recent Labs    02/15/20 1443  NA 133*  K 4.1  CL 96*  CO2 27  GLUCOSE 155*  BUN 38*  CREATININE 6.25*  CALCIUM 8.9    Intake/Output Summary (Last 24 hours) at 02/17/2020 1034 Last data filed at 02/17/2020 0951 Gross per 24 hour  Intake 760 ml  Output --  Net 760 ml     Pressure Injury 01/23/20 Buttocks Left Deep Tissue Pressure Injury - Purple or maroon localized area of discolored intact skin or blood-filled blister due to damage of underlying soft tissue from pressure and/or shear. Circular, dark, red area, surrounde (Active)  01/23/20 1609  Location: Buttocks  Location Orientation: Left  Staging: Deep Tissue Pressure Injury - Purple or maroon localized area of discolored intact skin or blood-filled blister due to damage of underlying soft tissue from pressure and/or shear.  Wound Description (Comments): Circular, dark, red area, surrounded by MASD  Present on Admission: Yes    Physical Exam: Vital Signs Blood pressure (!) 151/52, pulse 60, temperature 97.9 F (36.6 C), resp. rate 18, height '5\' 10"'  (1.778 m), weight 85.5 kg, SpO2 95 %. Gen: no distress, normal appearing HEENT: oral mucosa pink and moist, NCAT Cardio: Reg rate Chest: normal effort, normal rate of breathing Abd: soft, non-distended Ext: no edema Psych: flat affect, slightly more interactive Skin: Warm and dry.  L BKA-  Well shaped with shrinker- 1/2 of staples removed- looks good.    L hip looks great- sutures out-steristrips-  L thigh with no swelling or drainage Musc: Left BKA with  mild edema and tenderness LLE less swollen/edematous-  Neuro: Alert Motor: Right lower extremity: Hip flexion, knee extension 4-5/5 (pain inhibition), ankle dorsiflexion 4+/5, stable exam Left lower extremity: Hip flexion, knee extension 3/5 (some pain inhibition), ankle dorsiflexion 4/5   Assessment/Plan: 1. Functional deficits which require 3+ hours per day of interdisciplinary therapy in a comprehensive inpatient rehab setting.  Physiatrist is providing close team supervision and 24 hour management of active medical problems listed below.  Physiatrist and rehab team continue to assess barriers to discharge/monitor patient progress toward functional and medical goals  Care Tool:  Bathing    Body parts bathed by patient: Right arm,Left arm,Chest,Abdomen,Face,Right upper leg,Left upper leg,Right lower leg,Left lower leg,Buttocks   Body parts bathed by helper: Buttocks,Right lower leg Body parts n/a: Left lower leg   Bathing assist Assist Level: Contact Guard/Touching assist     Upper Body Dressing/Undressing Upper body dressing   What is the patient wearing?: Pull over shirt    Upper body assist Assist Level: Set up assist    Lower Body Dressing/Undressing Lower body dressing      What is the patient wearing?: Pants     Lower body assist Assist for lower body dressing: Maximal Assistance - Patient 25 - 49% (from sit to stand level today)     Toileting Toileting Toileting Activity did not occur (Clothing management and hygiene only): N/A (no void or bm)  Toileting assist Assist for toileting: Maximal Assistance - Patient 25 - 49%  Assistive Device Comment: female urinal   Transfers Chair/bed transfer  Transfers assist  Chair/bed transfer activity did not occur: Safety/medical concerns  Chair/bed transfer assist level: Supervision/Verbal cueing Chair/bed transfer assistive device: Sliding board   Locomotion Ambulation   Ambulation assist   Ambulation  activity did not occur: Safety/medical concerns  Assist level: 2 helpers Assistive device: Walker-rolling Max distance: 6'   Walk 10 feet activity   Assist  Walk 10 feet activity did not occur: Safety/medical concerns        Walk 50 feet activity   Assist Walk 50 feet with 2 turns activity did not occur: Safety/medical concerns         Walk 150 feet activity   Assist Walk 150 feet activity did not occur: Safety/medical concerns         Walk 10 feet on uneven surface  activity   Assist Walk 10 feet on uneven surfaces activity did not occur: Safety/medical concerns         Wheelchair     Assist Will patient use wheelchair at discharge?: Yes Type of Wheelchair: Manual    Wheelchair assist level: Supervision/Verbal cueing Max wheelchair distance: 150'    Wheelchair 50 feet with 2 turns activity    Assist        Assist Level: Supervision/Verbal cueing   Wheelchair 150 feet activity     Assist      Assist Level: Supervision/Verbal cueing   Medical Problem List and Plan: 1.  Impaired mobility and ADLs secondary to L BKA  Continue CIR 2.  Antithrombotics: -DVT/anticoagulation:  Pharmaceutical: Lovenox             -antiplatelet therapy: N/A 3. Chronic pain/Pain Management: Was on oxycodone 5 mg tid prn as well Cymbalta for neuropathy (at baseline)- now on 10-15 mg q4 hours prn  12/2- will start MS Contin 15 mg BID  12/3-5- MUCH improved pain for pt- con't MS Contin  12/9- pain a little worse today- change flexeril to TID prn  12/17: pain is well controlled- continue current regimen.  4. Mood: Team to provide ego support/encouragement.              -antipsychotic agents: N/A 5. Neuropsych: This patient is capable of making decisions on her own behalf. 6. Skin/Wound Care: Monitor wound daily   Continue stump shrinker   12/15- will call [facility] again and see if we can remove staples- we called- they said they would come see pt.    12/16- took out 1/2 staples- will remove rest in 2 weeks at f/u.  7. Fluids/Electrolytes/Nutrition: Strict I/O. 1200 cc FR. Will consult dietician to educate on renal diet.  8. Left hip ORIF/hip abscess s/p I&D 11/18: NWB LLE.   Flagyl changed to Ertepenem due to patient refusal, continue  12/1- on Ertepenem until end of December.  12/7- pt has new rim enhancing lesions in L hip, thigh/glute, etc- have called Ortho and also calling ID to discuss- might have been since switched off Flagyl?   12/9- ID suggested going to IR to get abscesses drained- WBC is still 12.1k- con't Ertepenem  12/10- IR today for draining abscesses  12/11: CBC ordered for Monday.   12/13- WBC down to 10.4  12/16- WBC 11.5- is back up some- will check CRP and ESR to see what else might be going on.  9. 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  10. T1DM with neuropathy: Hgb A1c-6.2. Was on Lantus 50 units in am with 10 units novolog tid.     Lantus 38 twice daily, decreased to 10 on 11/27, increased to 15 on 11/28  NovoLog 5 units with breakfast, 8 units with lunch and dinner, decreased to 5 3 times daily on 11/27, DC'd on 11/27.  12/8- Had hypoglycemia x1- of 69- con't regimen for now  12/12: CBGs 101-150: educated patient regarding goal CBGs and benefits of no added sugar in diet.  12/17: CBGs ranging from 88- 215: continue current regimen 11. Now ESRD: Now on HD MWF (set up at [facility] post discharge)  12. Anemia of chronic disease/iron deficiency: Hgb-6.3 at admission--->9.1 s/p 4 units PRBC. Feraheme X 1 (additional dose held due to infection) and Aranesp weekly.   Hemoglobin 9.7 on 11/26, repeat CBC ordered for Monday.   12/13- Hb 10.1- improving  Continue to monitor 13. HTN: Monitor BP tid--continue Coreg bid, Norvasc and  apresoline tid.   Decreased Coreg to 12.52m BID.   141/74 diastolic soft and pulse bradycardic: decrease coreg to 6.267mBID.  14. IBS-D:   MiraLAX DC'd  Resumed Lotrenex    Colace as needed ordered  12/2- will make colace scheduled and add sorbitol if no BM by 3pm  12/4 changed colace to senna-s given addn of MS Contin  12/15- refusing ALL laxatives- due to IBS with diarrhea, however no BM since Sunday- explained if doesn't go today, will need to take something  12/16- Actually said had small BM Tuesday, but "needs to go today"- which I've heard for 3 days in a row- if no BM by 5pm, will order sorbitol.  15. Malnutrition: Continue protein supplement.  16. Abdominal pain: Due to diarrhea? PPI resumed-->[place].  17.  Sleep disturbance:   Low dose elavil at bedtime.  Melatonin added on 11/26  Improving overall 18. Leukocytosis:   WBCs 11.6 on 11/26, labs with HD  See #8, #9  Afebrile  12/3- WBC 11.1- afebrile; no Sx's- con't to monitor  12/6- WBC 11.3- cause??  12/7- WBC 12.0- due to abscesses in L thigh, glute, etc causing edema- have contacted Ortho AND ID- waiting to hear back.   12/9- ID is calling IR to get abscesses drained.  12/10- IR doing IR drainage today  12/14- 40 cc purulent drainage- no growth to date.  19. LE/thigh edema  12/6- will check CT of abd/pelvis with contrast to determine cause-got OK by nephrology to use contrast- has 2-3+ edema of thighs L>R  12/7- due to rim enhancing lesions- as above.   12/8- will con't Ertepenem- per ID.   12/13- s/p IR- WBC 10.4- con't Ertepenem 20. Depression-new-  12/8- will add Celexa 20 mg daily- max DOSE  12/9- had to decrease Duloxetine to 30 mg daily.    12/15- slightly more interactive   LOS: 25 days A FACE TO FACE EVALUATION WAS PERFORMED  KrMartha Clan Ynez Eugenio 02/17/2020, 10:34 AM

## 2020-02-17 NOTE — Progress Notes (Signed)
Physical Therapy Session Note  Patient Details  Name: [name] MRN: [id number] Date of Birth: 1966/03/02  Today's Date: 02/17/2020 PT Individual Time: 1219-7588 PT Individual Time Calculation (min): 45 min   Short Term Goals: Week 4:  PT Short Term Goal 1 (Week 4): STG=LTG due to ELOS  Skilled Therapeutic Interventions/Progress Updates:    Patient initially on bedpan and requested PT to wait and nursing assisted with toileting.  Missed 15 minutes due to toileting with nursing.  Reports daughter measured height of car 27".  Patient supine to sit with S.  Slide board to w/c with S.  Propelled to ortho gym.  Performed car transfer including w/c set up with min a to 27" height.  Patient sit to stand at [facility] with max A to RW and stood x 2:47 for touching letters on screen.  Sit to stand x 2 more trials mod to max A and cues.  Stand pivot with RW to bed mod A and max cues going to R.  Sit to supine S.  Positioned with pillows and left with all needs in reach and bed alarm active.   Therapy Documentation Precautions:  Precautions Precautions: Fall Precaution Comments: new L BKA Required Braces or Orthoses: Other Brace Other Brace: L LE limb guard Restrictions Weight Bearing Restrictions: Yes LLE Weight Bearing: Non weight bearing General: PT Amount of Missed Time (min): 15 Minutes PT Missed Treatment Reason: Nursing care Pain: Pain Assessment Pain Scale: 0-10 Pain Score: 9  Faces Pain Scale: Hurts even more Pain Type: Acute pain Pain Location: Hip Pain Orientation: Left Pain Radiating Towards: back Pain Descriptors / Indicators: Aching Pain Frequency: Constant Pain Onset: On-going Patients Stated Pain Goal: 3 Pain Intervention(s): Rest Multiple Pain Sites: No    Therapy/Group: Individual Therapy  Reginia Naas  Magda Kiel, PT 02/17/2020, 12:16 PM

## 2020-02-18 LAB — GLUCOSE, CAPILLARY
Glucose-Capillary: 75 mg/dL (ref 70–99)
Glucose-Capillary: 113 mg/dL — ABNORMAL HIGH (ref 70–99)
Glucose-Capillary: 192 mg/dL — ABNORMAL HIGH (ref 70–99)
Glucose-Capillary: 174 mg/dL — ABNORMAL HIGH (ref 70–99)

## 2020-02-18 NOTE — Progress Notes (Signed)
[name] PHYSICAL MEDICINE & REHABILITATION PROGRESS NOTE   Subjective/Complaints:  Pt reports therapy going well- admits that "doesn't always gets up in chair" between/after therapies- explained it improves endurance.   LBM yesterday.  Renal called and reminded needs an IJ PICC, not arm PICC to save vessels for HD.    ROS:   Pt denies SOB, abd pain, CP, N/V/C/D, and vision changes  Objective:   No results found. Recent Labs    02/15/20 1443 02/17/20 1324  WBC 11.5* 12.0*  HGB 10.0* 10.4*  HCT 33.4* 34.8*  PLT 247 302   Recent Labs    02/15/20 1443 02/17/20 1325  NA 133* 131*  K 4.1 3.9  CL 96* 94*  CO2 27 27  GLUCOSE 155* 119*  BUN 38* 39*  CREATININE 6.25* 6.20*  CALCIUM 8.9 8.9    Intake/Output Summary (Last 24 hours) at 02/18/2020 1042 Last data filed at 02/18/2020 0645 Gross per 24 hour  Intake 580 ml  Output 3000 ml  Net -2420 ml     Pressure Injury 01/23/20 Buttocks Left Deep Tissue Pressure Injury - Purple or maroon localized area of discolored intact skin or blood-filled blister due to damage of underlying soft tissue from pressure and/or shear. Circular, dark, red area, surrounde (Active)  01/23/20 1609  Location: Buttocks  Location Orientation: Left  Staging: Deep Tissue Pressure Injury - Purple or maroon localized area of discolored intact skin or blood-filled blister due to damage of underlying soft tissue from pressure and/or shear.  Wound Description (Comments): Circular, dark, red area, surrounded by MASD  Present on Admission: Yes    Physical Exam: Vital Signs Blood pressure (!) 152/56, pulse 61, temperature 98.1 F (36.7 C), temperature source Oral, resp. rate 18, height 5' 10" (1.778 m), weight 86 kg, SpO2 98 %. Gen: sitting up in bed- appropriate, flat affect, NAD HEENT: oral mucosa pink and moist, NCAT Cardio: borderline bradycardia,  Chest: CTA B/L- no W/R/R- good air movement Abd: Soft, NT, ND, (+)BS  Ext: no edema Psych:  flat affect,unhappy I asked her to get out of bed more Skin: Warm and dry.  L BKA-  Well shaped with shrinker- 1/2 of staples removed- looks good.    L hip looks great- sutures out-steristrips-  L thigh with no swelling or drainage Musc: Left BKA with mild edema and tenderness LLE less swollen/edematous-  Neuro: Alert Motor: Right lower extremity: Hip flexion, knee extension 4-5/5 (pain inhibition), ankle dorsiflexion 4+/5, stable exam Left lower extremity: Hip flexion, knee extension 3/5 (some pain inhibition), ankle dorsiflexion 4/5   Assessment/Plan: 1. Functional deficits which require 3+ hours per day of interdisciplinary therapy in a comprehensive inpatient rehab setting.  Physiatrist is providing close team supervision and 24 hour management of active medical problems listed below.  Physiatrist and rehab team continue to assess barriers to discharge/monitor patient progress toward functional and medical goals  Care Tool:  Bathing    Body parts bathed by patient: Right arm,Left arm,Chest,Abdomen,Face,Right upper leg,Left upper leg,Right lower leg,Left lower leg,Buttocks   Body parts bathed by helper: Buttocks,Right lower leg Body parts n/a: Left lower leg   Bathing assist Assist Level: Contact Guard/Touching assist     Upper Body Dressing/Undressing Upper body dressing   What is the patient wearing?: Pull over shirt    Upper body assist Assist Level: Set up assist    Lower Body Dressing/Undressing Lower body dressing      What is the patient wearing?: Pants     Lower  body assist Assist for lower body dressing: Maximal Assistance - Patient 25 - 49% (from sit to stand level today)     Toileting Toileting Toileting Activity did not occur (Clothing management and hygiene only): N/A (no void or bm)  Toileting assist Assist for toileting: Maximal Assistance - Patient 25 - 49% Assistive Device Comment: female urinal   Transfers Chair/bed transfer  Transfers  assist  Chair/bed transfer activity did not occur: Safety/medical concerns  Chair/bed transfer assist level: Moderate Assistance - Patient 50 - 74% (stand pivot with RW) Chair/bed transfer assistive device: Programmer, multimedia   Ambulation assist   Ambulation activity did not occur: Safety/medical concerns  Assist level: 2 helpers Assistive device: Walker-rolling Max distance: 6'   Walk 10 feet activity   Assist  Walk 10 feet activity did not occur: Safety/medical concerns        Walk 50 feet activity   Assist Walk 50 feet with 2 turns activity did not occur: Safety/medical concerns         Walk 150 feet activity   Assist Walk 150 feet activity did not occur: Safety/medical concerns         Walk 10 feet on uneven surface  activity   Assist Walk 10 feet on uneven surfaces activity did not occur: Safety/medical concerns         Wheelchair     Assist Will patient use wheelchair at discharge?: Yes Type of Wheelchair: Manual    Wheelchair assist level: Supervision/Verbal cueing Max wheelchair distance: 120'    Wheelchair 50 feet with 2 turns activity    Assist        Assist Level: Supervision/Verbal cueing   Wheelchair 150 feet activity     Assist      Assist Level: Supervision/Verbal cueing   Medical Problem List and Plan: 1.  Impaired mobility and ADLs secondary to L BKA  Continue CIR 2.  Antithrombotics: -DVT/anticoagulation:  Pharmaceutical: Lovenox             -antiplatelet therapy: N/A 3. Chronic pain/Pain Management: Was on oxycodone 5 mg tid prn as well Cymbalta for neuropathy (at baseline)- now on 10-15 mg q4 hours prn  12/2- will start MS Contin 15 mg BID  12/3-5- MUCH improved pain for pt- con't MS Contin  12/9- pain a little worse today- change flexeril to TID prn  12/17: pain is well controlled- continue current regimen.   12/18- pain controlled almost ready for d/c. Send home on current meds-  might be able to wean soon after d/c.  4. Mood: Team to provide ego support/encouragement.              -antipsychotic agents: N/A 5. Neuropsych: This patient is capable of making decisions on her own behalf. 6. Skin/Wound Care: Monitor wound daily   Continue stump shrinker   12/15- will call Vascular again and see if we can remove staples- we called- they said they would come see pt.   12/16- took out 1/2 staples- will remove rest in 2 weeks at f/u.  7. Fluids/Electrolytes/Nutrition: Strict I/O. 1200 cc FR. Will consult dietician to educate on renal diet.  8. Left hip ORIF/hip abscess s/p I&D 11/18: NWB LLE.   Flagyl changed to Ertepenem due to patient refusal, continue  12/1- on Ertepenem until end of December.  12/7- pt has new rim enhancing lesions in L hip, thigh/glute, etc- have called Ortho and also calling ID to discuss- might have been since switched off Flagyl?  12/9- ID suggested going to IR to get abscesses drained- WBC is still 12.1k- con't Ertepenem  12/10- IR today for draining abscesses  12/11: CBC ordered for Monday.   12/13- WBC down to 10.4  12/16- WBC 11.5- is back up some- will check CRP and ESR to see what else might be going on.  9. 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   12/18- WBC up slightly to 12k- con't Ertapenem- needs IJ PICC- placed order- per renal 10. T1DM with neuropathy: Hgb A1c-6.2. Was on Lantus 50 units in am with 10 units novolog tid.     Lantus 38 twice daily, decreased to 10 on 11/27, increased to 15 on 11/28  NovoLog 5 units with breakfast, 8 units with lunch and dinner, decreased to 5 3 times daily on 11/27, DC'd on 11/27.  12/8- Had hypoglycemia x1- of 69- con't regimen for now  12/12: CBGs 101-150: educated patient regarding goal CBGs and benefits of no added sugar  in diet.  12/17: CBGs ranging from 88- 215: continue current regimen  12/18- BGs 75-281- otherwise below 120- con't regimen 11. Now ESRD: Now on HD MWF (set up at [place] post discharge)  12/18- to start HD outpt on Wednesday.   12. Anemia of chronic disease/iron deficiency: Hgb-6.3 at admission--->9.1 s/p 4 units PRBC. Feraheme X 1 (additional dose held due to infection) and Aranesp weekly.   Hemoglobin 9.7 on 11/26, repeat CBC ordered for Monday.   12/13- Hb 10.1- improving  Continue to monitor 13. HTN: Monitor BP tid--continue Coreg bid, Norvasc and apresoline tid.   Decreased Coreg to 12.36m BID.   182/99 diastolic soft and pulse bradycardic: decrease coreg to 6.234mBID.  14. IBS-D:   MiraLAX DC'd  Resumed Lotrenex    Colace as needed ordered  12/2- will make colace scheduled and add sorbitol if no BM by 3pm  12/4 changed colace to senna-s given addn of MS Contin  12/15- refusing ALL laxatives- due to IBS with diarrhea, however no BM since Sunday- explained if doesn't go today, will need to take something  12/16- Actually said had small BM Tuesday, but "needs to go today"- which I've heard for 3 days in a row- if no BM by 5pm, will order sorbitol.   1218- LBM yesterday con't regimen 15. Malnutrition: Continue protein supplement.  16. Abdominal pain: Due to diarrhea? PPI resumed-->patient to have family bring in deIrwinrom home.  17.  Sleep disturbance:   Low dose elavil at bedtime.  Melatonin added on 11/26  Improving overall 18. Leukocytosis:   WBCs 11.6 on 11/26, labs with HD  See #8, #9  Afebrile  12/3- WBC 11.1- afebrile; no Sx's- con't to monitor  12/6- WBC 11.3- cause??  12/7- WBC 12.0- due to abscesses in L thigh, glute, etc causing edema- have contacted Ortho AND ID- waiting to hear back.   12/9- ID is calling IR to get abscesses drained.  12/10- IR doing IR drainage today  12/14- 40 cc purulent drainage- no growth to date.  19. LE/thigh  edema  12/6- will check CT of abd/pelvis with contrast to determine cause-got OK by nephrology to use contrast- has 2-3+ edema of thighs L>R  12/7- due to rim enhancing lesions- as above.   12/8- will con't Ertepenem- per ID.  12/13- s/p IR- WBC 10.4- con't Ertepenem  12/18- WBC 12k- checking labs Monday- - also needs PICC- IJ type 20. Depression-new-  12/8- will add Celexa 20 mg daily- max DOSE  12/9- had to decrease Duloxetine to 30 mg daily.    12/15- slightly more interactive   LOS: 26 days A FACE TO FACE EVALUATION WAS PERFORMED  Megan Lovorn 02/18/2020, 10:42 AM

## 2020-02-19 ENCOUNTER — Inpatient Hospital Stay (HOSPITAL_COMMUNITY): Payer: Medicaid Other | Admitting: Physical Therapy

## 2020-02-19 LAB — GLUCOSE, CAPILLARY
Glucose-Capillary: 108 mg/dL — ABNORMAL HIGH (ref 70–99)
Glucose-Capillary: 132 mg/dL — ABNORMAL HIGH (ref 70–99)
Glucose-Capillary: 152 mg/dL — ABNORMAL HIGH (ref 70–99)
Glucose-Capillary: 152 mg/dL — ABNORMAL HIGH (ref 70–99)

## 2020-02-19 MED ORDER — CHLORHEXIDINE GLUCONATE CLOTH 2 % EX PADS
6.0000 | MEDICATED_PAD | Freq: Two times a day (BID) | CUTANEOUS | Status: DC
  Administered 2020-02-19 – 2020-02-20 (×3): 6 via TOPICAL

## 2020-02-19 NOTE — Progress Notes (Signed)
Physical Therapy Session Note  Patient Details  Name: [name] MRN: [id number] Date of Birth: [birth year]  Today's Date: 02/19/2020 PT Individual Time: 1500-1530 PT Individual Time Calculation (min): 30 min   Short Term Goals: Week 4:  PT Short Term Goal 1 (Week 4): STG=LTG due to ELOS  Skilled Therapeutic Interventions/Progress Updates:    Pt received seated in bed, agreeable to PT session. Pt reports ongoing pain in L hip and LLE, not rated and declines intervention. Supine to sit at mod I level with use of bedrail. Sit to stand 2 x 5 reps from elevated bed to RW with mod A, focus on UE placement and decreased reliance on bracing with RLE against the bed. Seated BLE strengthening therex x 15 reps: marches, LAQ, hip add squeeze. Pt returned to supine at mod I level with use of bedrail. Supine BLE strengthening therex: SLR and hip abd x 15 reps each. Pt left supine in bed with needs in reach, bed alarm in place at end of session.  Therapy Documentation Precautions:  Precautions Precautions: Fall Precaution Comments: new L BKA Required Braces or Orthoses: Other Brace Other Brace: L LE limb guard Restrictions Weight Bearing Restrictions: Yes LLE Weight Bearing: Non weight bearing   Therapy/Group: Individual Therapy   Excell Seltzer, PT, DPT  02/19/2020, 4:53 PM

## 2020-02-19 NOTE — Progress Notes (Signed)
[facility] NEPHROLOGY PROGRESS NOTE  Assessment/ Plan: Pt is a 54 y.o. yo female with new ESRD, left foot infection is status post a BKA, left hip fracture status post ORIF, now in CIR.  Physical Exam: General: Not in distress, comfortable Heart:RRR, s1s2 nl Lungs: Clear b/l, no crackle Abdomen:soft, Non-tender, non-distended Extremities: RLE edema+, left BKA Dialysis Access: Right IJ TDC, maturing left AV fistula.   Problems: # AoCKD 5,now ESRD: Baseline Creat 4.9- 5.2, eGFR 9-11 from 8/21 - 10/21. Creat 6 on admission, then mid 7's, IVF's given w/o improvementso started HD.Has been clipped to [facility].S/p [facility] and AVF on11/8, 1st HD 11/9. Continue MWF schedule here. HD tomorrow.   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 S/p 4U PRBCs.Tsat low 10%, got 548m Feraheme on 11/6, holding further doses due to infection. IncreasedAranesp 60 to 100 q week on 11/17.  Monitor hemoglobin.  #L foot stump chronic open wound w/ poss osteo by plain films-> s/pL BKA 01/12/20  #L hip fracture - admission diagnosis.S/p ORIF on 10/28 per ortho.F/b ID. Complicated by wound infection with Enterobacter cloacae and Bacteroides.  Status post I&D 11/18.  Currently on antibiotics for planned 6-week course through 12/30. Further complicated by abscess in the left hip status post IR aspiration 12/10 with 40 cc of purulent fluid.  Cultures no growth to date. Pt needs Invanz (didn't tolerate ceph/ flagyl regimen) and this can't be done in the OP HD unit, so pt will need a PICC line for home IV abx. And due to her ESRD status, she needs a special PICC line, called IJ PICC, which IR can do for [place] Have d/w PMR MD.   #HTN/ vol -lowered amlodipine dose.  Also on hydralazine, coreg and norvasc.  UF during HD.  #BMM -monitor calcium and phosphorus level.  Continue sevelamer.  PTH 44.   #Hyponatremia, hypervolemic: She has high intradialytic weight gain.  Discussed about fluid restriction.  UF as  tolerated.  #Nutrition: albumin very low, on protein supplement.Renal diet w/fluid restrictions.  RKelly Splinter MD 02/19/2020, 6:49 PM      Subjective: Seen in room, no c/o's.   Objective Vital signs in last 24 hours: Vitals:   02/19/20 0557 02/19/20 0559 02/19/20 0800 02/19/20 1400  BP: (!) 139/55  (!) 148/60 (!) 143/59  Pulse: (!) 56  (!) 58 60  Resp: '16  20 18  ' Temp: 98.5 F (36.9 C)  98.5 F (36.9 C) 98.8 F (37.1 C)  TempSrc:   Oral Oral  SpO2: 97%  98% 99%  Weight:  74.3 kg    Height:       Weight change: -12.6 kg  Intake/Output Summary (Last 24 hours) at 02/19/2020 1849 Last data filed at 02/19/2020 0900 Gross per 24 hour  Intake 120 ml  Output 375 ml  Net -255 ml       Labs: Basic Metabolic Panel: Recent Labs  Lab 02/13/20 0632 02/15/20 1443 02/17/20 1325  NA 129* 133* 131*  K 4.7 4.1 3.9  CL 92* 96* 94*  CO2 '28 27 27  ' GLUCOSE 90 155* 119*  BUN 43* 38* 39*  CREATININE 7.18* 6.25* 6.20*  CALCIUM 9.1 8.9 8.9  PHOS  --  3.8 3.6   Liver Function Tests: Recent Labs  Lab 02/15/20 1443 02/17/20 1325  ALBUMIN 2.3* 2.5*   No results for input(s): LIPASE, AMYLASE in the last 168 hours. No results for input(s): AMMONIA in the last 168 hours. CBC: Recent Labs  Lab 02/13/20 0846612/15/21 1443 02/17/20  1324  WBC 10.4 11.5* 12.0*  HGB 10.1* 10.0* 10.4*  HCT 34.1* 33.4* 34.8*  MCV 90.0 90.0 90.4  PLT 251 247 302   Cardiac Enzymes: No results for input(s): CKTOTAL, CKMB, CKMBINDEX, TROPONINI in the last 168 hours. CBG: Recent Labs  Lab 02/18/20 1634 02/18/20 2103 02/19/20 0638 02/19/20 1123 02/19/20 1648  GLUCAP 192* 174* 108* 132* 152*    Iron Studies: No results for input(s): IRON, TIBC, TRANSFERRIN, FERRITIN in the last 72 hours. Studies/Results: No results found.  Medications: Infusions: . sodium chloride Stopped (02/17/20 2100)  . ertapenem 500 mg (02/19/20 1729)    Scheduled Medications: . (feeding supplement)  PROSource Plus  30 mL Oral BID BM  . alosetron  1 mg Oral BID  . amitriptyline  10 mg Oral QHS  . amLODipine  5 mg Oral Daily  . atorvastatin  40 mg Oral q1800  . carvedilol  6.25 mg Oral BID WC  . Chlorhexidine Gluconate Cloth  6 each Topical BID  . citalopram  20 mg Oral Daily  . darbepoetin (ARANESP) injection - DIALYSIS  100 mcg Intravenous Q Mon-HD  . DULoxetine  30 mg Oral Daily  . heparin sodium (porcine)  1,000 Units Intravenous Q M,W,F-HD  . hydrALAZINE  100 mg Oral TID  . insulin aspart  0-5 Units Subcutaneous QHS  . insulin aspart  0-9 Units Subcutaneous TID WC  . insulin glargine  15 Units Subcutaneous QHS  . melatonin  1.5 mg Oral QHS  . morphine  15 mg Oral Q12H  . multivitamin  1 tablet Oral QHS  . pantoprazole  40 mg Oral Daily  . sevelamer carbonate  1,600 mg Oral TID WC  . sorbitol  30 mL Oral Once    have reviewed scheduled and prn medications.

## 2020-02-19 NOTE — Progress Notes (Signed)
Occupational Therapy Discharge Summary  Patient Details  Name: [name] MRN: [id number] Date of Birth: Feb 02, 1966     Patient has met 7 of 9 long term goals due to improved activity tolerance, improved balance, postural control, ability to compensate for deficits and improved coordination.  Patient to discharge at [facility] Assist level.  Patient's care partner is independent to provide the necessary physical assistance at discharge.  Pt is overall close supervision - CGA with slide board transfers to/from wheelchair and drop arm BSC, depending on her inconsistent hip pain, and is able most times to place board herself with lateral leans. Able to perform LB bathing mostly at sink level and able to reach buttocks with lateral leans at EOB. LB dressing at EOB with supervision with lateral leans or able to static stand with BUE support for caregiver to don/doff over hips. Pt does still require assist with hygiene as she has difficulty anterior weight shifting and will need assist from daughter for thoroughly cleaning buttocks at home. Family ed to be completed on 12/20 with daughter [name] who was able to return demo safe transfers and sit to stand with patient.   Reasons goals not met: Pt unable to perform dynamic standing tasks at this time requiring BUE support on surface when standing to assist caregiver and decrease burden, also max A with toilet tasks for hygiene and clothing management.  Recommendation:  Patient will benefit from ongoing skilled OT services in home health setting to continue to advance functional skills in the area of BADL and Reduce care partner burden.  Equipment: drop arm BSC  Reasons for discharge: treatment goals met and discharge from hospital  Patient/family agrees with progress made and goals achieved: Yes  OT Discharge Precautions/Restrictions  Precautions Precautions: Fall Precaution Comments: new L BKA, inconsistent hip pain Restrictions Weight  Bearing Restrictions: Yes LLE Weight Bearing: Non weight bearing Vital Signs Therapy Vitals Temp: 98.8 F (37.1 C) Temp Source: Oral Pulse Rate: 60 Resp: 18 BP: (!) 143/59 Patient Position (if appropriate): Sitting Oxygen Therapy SpO2: 99 % ADL ADL Equipment Provided: Reacher,Long-handled sponge Eating: Independent Grooming: Modified independent Upper Body Bathing: Modified independent Lower Body Bathing: Contact guard (partially from sink level and also lateral leans at EOB for buttocks) Where Assessed-Lower Body Bathing: Bed level,Sitting at sink Upper Body Dressing: Setup Where Assessed-Upper Body Dressing: Edge of bed Lower Body Dressing: Supervision/safety (lateral leans EOB) Where Assessed-Lower Body Dressing: Bed level,Edge of bed Toileting: Maximal assistance Where Assessed-Toileting: Bedside Commode Toilet Transfer: Therapist, music Method: Theatre manager: Drop arm bedside commode Vision Baseline Vision/History: Wears glasses Wears Glasses: Reading only Patient Visual Report: No change from baseline Vision Assessment?: No apparent visual deficits Perception  Perception: Within Functional Limits Praxis Praxis: Intact Cognition Overall Cognitive Status: Within Functional Limits for tasks assessed Arousal/Alertness: Awake/alert Orientation Level: Oriented X4 Memory: Appears intact Awareness: Appears intact Problem Solving: Appears intact Safety/Judgment: Appears intact Sensation Sensation Light Touch: Impaired Detail Peripheral sensation comments: decreased at R toes, denies phantom sensation Light Touch Impaired Details: Absent RLE Coordination Gross Motor Movements are Fluid and Coordinated: No Fine Motor Movements are Fluid and Coordinated: Yes Coordination and Movement Description: decreased coordination at times, sequencing transfers, pending pain levels Motor  Motor Motor: Abnormal postural alignment and  control Motor - Skilled Clinical Observations: generalized weakness present but improved from eval, L hip pain inconsistent but significantly improved Mobility  Bed Mobility Bed Mobility: Sit to Supine;Supine to Sit Supine to Sit: Independent  with assistive device Sit to Supine: Independent with assistive device Transfers Sit to Stand: Moderate Assistance - Patient 50-74%  Trunk/Postural Assessment  Cervical Assessment Cervical Assessment: Within Functional Limits Thoracic Assessment Thoracic Assessment: Exceptions to [facility] Lumbar Assessment Lumbar Assessment: Exceptions to [facility] Postural Control Postural Control: Within Functional Limits  Balance Static Sitting Balance Static Sitting - Balance Support: Feet unsupported;No upper extremity supported Static Sitting - Level of Assistance: 7: Independent Dynamic Sitting Balance Dynamic Sitting - Balance Support: During functional activity;Left upper extremity supported Dynamic Sitting - Level of Assistance: 5: Stand by assistance Sitting balance - Comments: during bathing and dressing tasks EOB or at sink level Static Standing Balance Static Standing - Balance Support: Bilateral upper extremity supported;During functional activity Static Standing - Level of Assistance: 4: Min assist Static Standing - Comment/# of Minutes: static standing at sink level to asisst caregiver with LB ADL Extremity/Trunk Assessment RUE Assessment RUE Assessment: Within Functional Limits General Strength Comments: generalized weakness present but improved from eval LUE Assessment LUE Assessment: Within Functional Limits General Strength Comments: generalized weakness present but improved from eval   [name] 02/19/2020, 5:09 PM

## 2020-02-20 ENCOUNTER — Encounter (HOSPITAL_COMMUNITY): Payer: Medicaid Other

## 2020-02-20 ENCOUNTER — Inpatient Hospital Stay (HOSPITAL_COMMUNITY): Payer: Medicaid Other

## 2020-02-20 ENCOUNTER — Ambulatory Visit (HOSPITAL_COMMUNITY): Payer: Medicaid Other

## 2020-02-20 LAB — CBC WITH DIFFERENTIAL/PLATELET
Abs Immature Granulocytes: 0.06 10*3/uL (ref 0.00–0.07)
Basophils Absolute: 0.1 10*3/uL (ref 0.0–0.1)
Basophils Relative: 1 %
Eosinophils Absolute: 0.4 10*3/uL (ref 0.0–0.5)
Eosinophils Relative: 4 %
HCT: 35.9 % — ABNORMAL LOW (ref 36.0–46.0)
Hemoglobin: 11 g/dL — ABNORMAL LOW (ref 12.0–15.0)
Immature Granulocytes: 1 %
Lymphocytes Relative: 32 %
Lymphs Abs: 2.9 10*3/uL (ref 0.7–4.0)
MCH: 27.3 pg (ref 26.0–34.0)
MCHC: 30.6 g/dL (ref 30.0–36.0)
MCV: 89.1 fL (ref 80.0–100.0)
Monocytes Absolute: 1.1 10*3/uL — ABNORMAL HIGH (ref 0.1–1.0)
Monocytes Relative: 12 %
Neutro Abs: 4.5 10*3/uL (ref 1.7–7.7)
Neutrophils Relative %: 50 %
Platelets: 286 10*3/uL (ref 150–400)
RBC: 4.03 MIL/uL (ref 3.87–5.11)
RDW: 16.2 % — ABNORMAL HIGH (ref 11.5–15.5)
WBC: 9 10*3/uL (ref 4.0–10.5)
nRBC: 0 % (ref 0.0–0.2)

## 2020-02-20 LAB — RENAL FUNCTION PANEL
Albumin: 2.5 g/dL — ABNORMAL LOW (ref 3.5–5.0)
Anion gap: 9 (ref 5–15)
BUN: 55 mg/dL — ABNORMAL HIGH (ref 6–20)
CO2: 26 mmol/L (ref 22–32)
Calcium: 9 mg/dL (ref 8.9–10.3)
Chloride: 96 mmol/L — ABNORMAL LOW (ref 98–111)
Creatinine, Ser: 6.34 mg/dL — ABNORMAL HIGH (ref 0.44–1.00)
GFR, Estimated: 7 mL/min — ABNORMAL LOW (ref >60)
Glucose, Bld: 91 mg/dL (ref 70–99)
Phosphorus: 5.1 mg/dL — ABNORMAL HIGH (ref 2.5–4.6)
Potassium: 4.3 mmol/L (ref 3.5–5.1)
Sodium: 131 mmol/L — ABNORMAL LOW (ref 135–145)

## 2020-02-20 LAB — GLUCOSE, CAPILLARY
Glucose-Capillary: 88 mg/dL (ref 70–99)
Glucose-Capillary: 94 mg/dL (ref 70–99)
Glucose-Capillary: 83 mg/dL (ref 70–99)
Glucose-Capillary: 222 mg/dL — ABNORMAL HIGH (ref 70–99)

## 2020-02-20 LAB — SEDIMENTATION RATE: Sed Rate: 22 mm/hr (ref 0–22)

## 2020-02-20 LAB — C-REACTIVE PROTEIN: CRP: 0.8 mg/dL (ref <1.0)

## 2020-02-20 MED ORDER — MORPHINE SULFATE ER 15 MG PO TBCR
15.0000 mg | EXTENDED_RELEASE_TABLET | Freq: Two times a day (BID) | ORAL | Status: DC
Start: 2020-02-20 — End: 2020-02-21
  Administered 2020-02-20 – 2020-02-21 (×2): 15 mg via ORAL
  Filled 2020-02-20 (×2): qty 1

## 2020-02-20 MED ORDER — IOHEXOL 12 MG/ML PO SOLN
500.0000 mL | ORAL | Status: AC
Start: 2020-02-20 — End: 2020-02-20

## 2020-02-20 MED ORDER — OXYCODONE HCL ER 10 MG PO T12A
10.0000 mg | EXTENDED_RELEASE_TABLET | Freq: Two times a day (BID) | ORAL | Status: DC
Start: 2020-02-20 — End: 2020-02-20

## 2020-02-20 MED ORDER — INSULIN GLARGINE 100 UNIT/ML ~~LOC~~ SOLN
12.0000 [IU] | Freq: Every day | SUBCUTANEOUS | Status: DC
  Administered 2020-02-20: 12 [IU] via SUBCUTANEOUS
  Filled 2020-02-20 (×2): qty 0.12

## 2020-02-20 MED ORDER — HEPARIN SODIUM (PORCINE) 1000 UNIT/ML DIALYSIS: 2000.0000 [IU] | INTRAMUSCULAR | Status: DC | PRN

## 2020-02-20 MED ORDER — IOHEXOL 9 MG/ML PO SOLN
ORAL | Status: AC
  Filled 2020-02-20: qty 1000

## 2020-02-20 MED ORDER — DARBEPOETIN ALFA 100 MCG/0.5ML IJ SOSY
PREFILLED_SYRINGE | INTRAMUSCULAR | Status: AC
  Filled 2020-02-20: qty 0.5

## 2020-02-20 NOTE — Plan of Care (Signed)
  Problem: RH Balance Goal: LTG Patient will maintain dynamic sitting balance (PT) Description: LTG:  Patient will maintain dynamic sitting balance with assistance during mobility activities (PT) Outcome: Not Met (add Reason) Note: Still needing supervision for safety, daughter able to support. Goal: LTG Patient will maintain dynamic standing balance (PT) Description: LTG:  Patient will maintain dynamic standing balance with assistance during mobility activities (PT) Outcome: Not Met (add Reason) Note: Min A for safety due to residual R LE weakness   Problem: Sit to Stand Goal: LTG:  Patient will perform sit to stand with assistance level (PT) Description: LTG:  Patient will perform sit to stand with assistance level (PT) Outcome: Not Met (add Reason) Note: R LE weakness remains h/o CVA   Problem: RH Bed to Chair Transfers Goal: LTG Patient will perform bed/chair transfers w/assist (PT) Description: LTG: Patient will perform bed to chair transfers with assistance (PT). Outcome: Not Met (add Reason) Note: Supervision for safety, daughter able to provide support   Problem: RH Car Transfers Goal: LTG Patient will perform car transfers with assist (PT) Description: LTG: Patient will perform car transfers with assistance (PT). Outcome: Completed/Met   Problem: RH Ambulation Goal: LTG Patient will ambulate in controlled environment (PT) Description: LTG: Patient will ambulate in a controlled environment, # of feet with assistance (PT). Outcome: Completed/Met Note: Limited ambulation due to residual R LE weakness    Problem: RH Wheelchair Mobility Goal: LTG Patient will propel w/c in controlled environment (PT) Description: LTG: Patient will propel wheelchair in controlled environment, # of feet with assist (PT) Outcome: Completed/Met Goal: LTG Patient will propel w/c in home environment (PT) Description: LTG: Patient will propel wheelchair in home environment, # of feet with  assistance (PT). Outcome: Completed/Met Goal: LTG Patient will propel w/c in community environment (PT) Description: LTG: Patient will propel wheelchair in community environment, # of feet with assist (PT) Outcome: Completed/Met

## 2020-02-20 NOTE — Progress Notes (Addendum)
Occupational Therapy Session Note  Patient Details  Name: [name] MRN: [id number] Date of Birth: October 26, 1965  Today's Date: 02/20/2020 OT Individual Time: 8127-5170 OT Individual Time Calculation (min): 58 min    Short Term Goals: Week 1:  OT Short Term Goal 1 (Week 1): Pt will transfer with 1 assist in prep for toilet transfer and to decrease BOC OT Short Term Goal 1 - Progress (Week 1): Met OT Short Term Goal 2 (Week 1): Pt will perform LB dressing with Max A of 1 OT Short Term Goal 2 - Progress (Week 1): Met OT Short Term Goal 3 (Week 1): Pt will perform LB bathing with AE PRN with Mod A OT Short Term Goal 3 - Progress (Week 1): Met OT Short Term Goal 4 (Week 1): Pt will perform ADL activity for 10 mins without fatigue OT Short Term Goal 4 - Progress (Week 1): Met  Skilled Therapeutic Interventions/Progress Updates:    1:1. Pt received in bed agreeable to OT but declines bathing and dressing as she did it yesterday. 8/10 pain in hip, however just received medication. SB transfer with cueing for removing arm rest prior to board placement as pt forgot this step. Edu re removing arm rest keeps pt from sliding forward onto floor for more room for buttocks onto seat. Pt completes grooming at sink with MOD I seated in w/c. Pt requesting to do arm therex this session to be near bathroom if pt needs to have BM. Pt completes 4x1 min beach ball volley with 3# dowel rod in unsupported sitting at EOB for UE strength and dynamic siting balance. Pt completes DB therex with 3 # dumbbells as follow 2x10-15 to improve BUE strength required for BADLs and transfers with demo/ VC and demonstration for proper technique throughout  Shoulder: fle/ext, int/ext rotation, ab/adduct, horizontal ab/adduct, shoulder/chest press,  Elbow: flex/ext, sup/pronation Wrist: flex/ext  Exited session with pt seated in bed, exit alarm on and call light in reach   Session 2 (Family education): (55 min 1005-1100) Pt  and daughter present for family education. Pt and OT demo SB transfer EOB>w/c, clothing management with lateral elans, STS at sink, body mechanics, skin inspection, and kitchen moiblity/safety. Daughter with no questions and return demo SB transfer to [facility] with min VC for placement of w/c in proximity od DAC and STS from w/c at sink. Edu re not standing if pt fatigued d/t increased strength demands on daughter. Verbalized understanding. Discussed bedroom set up to allow increased space for pt to maneuver in w/c for increased independence. Daughter acquired at home job and able to be home now 95% of time. Edu re if daughter needs to leave for errands to attempt toileting prior to leaving and to set up pt with bed pan/cleaning supplies prior to leaving in case pt needs to have BM. Exited session with pt seated in bed, exit alarm on and call light in reach     Therapy Documentation Precautions:  Precautions Precautions: Fall Precaution Comments: new L BKA, inconsistent hip pain Required Braces or Orthoses: Other Brace Other Brace: L LE limb guard Restrictions Weight Bearing Restrictions: Yes LLE Weight Bearing: Non weight bearing General:   Vital Signs: Therapy Vitals Temp: 98.5 F (36.9 C) Pulse Rate: (!) 55 Resp: 17 BP: (!) 134/56 Patient Position (if appropriate): Lying Oxygen Therapy SpO2: 98 % Pain:   ADL: ADL Equipment Provided: Reacher,Long-handled sponge Eating: Independent Grooming: Modified independent Upper Body Bathing: Modified independent Lower Body Bathing: Contact guard (partially from  sink level and also lateral leans at EOB for buttocks) Where Assessed-Lower Body Bathing: Bed level,Sitting at sink Upper Body Dressing: Setup Where Assessed-Upper Body Dressing: Edge of bed Lower Body Dressing: Supervision/safety (lateral leans EOB) Where Assessed-Lower Body Dressing: Bed level,Edge of bed Toileting: Maximal assistance Where Assessed-Toileting: Bedside  Commode Toilet Transfer: Therapist, music Method: Theatre manager: Drop arm bedside commode Vision   Perception    Praxis   Exercises:   Other Treatments:     Therapy/Group: Individual Therapy  Tonny Branch 02/20/2020, 7:58 AM

## 2020-02-20 NOTE — Progress Notes (Signed)
ID Pharmacy Progress Note    Discharge plan per ID Dr. Juleen China:  Cefepime 2 gm IV q HD MWF through 12/30 No need for additional IV access or Flagyl at time of discharge Communicated with outpatient dialysis center    Jimmy Footman, PharmD, [place], [place] Infectious Diseases Clinical Pharmacist Phone: [phone] 02/20/2020 1:28 PM

## 2020-02-20 NOTE — Discharge Summary (Signed)
Physician Discharge Summary  Patient ID: [name] MRN: [id number] DOB/AGE: 1965/11/10 54 y.o.  Admit date: 01/23/2020 Discharge date: 02/21/2020  Discharge Diagnoses:  Principal Problem:   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[place]) Active Problems:   ESRD on dialysis [facility])   Sleep disturbance   Postoperative pain   Controlled type 1 diabetes mellitus with hyperglycemia (HCC)   Essential hypertension   Bacteremia   Hypoglycemia   Abscess   Discharged Condition:  Stable   Significant Diagnostic Studies: CT ABDOMEN PELVIS W CONTRAST  Result Date: 02/07/2020 CLINICAL DATA:  Abdominal distension. Right flank edema. History of left hip infection. EXAM: CT ABDOMEN AND PELVIS WITH CONTRAST TECHNIQUE: Multidetector CT imaging of the abdomen and pelvis was performed using the standard protocol following bolus administration of intravenous contrast. CONTRAST:  134m OMNIPAQUE IOHEXOL 300 MG/ML  SOLN COMPARISON:  CT dated May 07, 2005. FINDINGS: Lower chest: The lung bases are clear. The heart is enlarged. Hepatobiliary: The liver is normal. Status post cholecystectomy.There is no biliary ductal dilation. Pancreas: Normal contours without ductal dilatation. No peripancreatic fluid collection. Spleen: Unremarkable. Adrenals/Urinary Tract: --Adrenal glands: Unremarkable. --Right kidney/ureter: No hydronephrosis or radiopaque kidney stones. --Left kidney/ureter: No hydronephrosis or radiopaque kidney stones. --Urinary bladder: Unremarkable. Stomach/Bowel: --Stomach/Duodenum: No hiatal hernia or other gastric abnormality. Normal duodenal course and caliber. --Small bowel: Unremarkable. --Colon: Rectosigmoid diverticulosis without acute inflammation. --Appendix: Normal. Vascular/Lymphatic: Atherosclerotic calcification is present within the non-aneurysmal abdominal aorta, without hemodynamically significant stenosis. --No retroperitoneal lymphadenopathy. --No mesenteric lymphadenopathy.  --are enlarged left pelvic and left inguinal lymph nodes, likely reactive. Reproductive: Unremarkable Other: No ascites or free air. There is extensive bilateral flank subcutaneous edema with overlying skin thickening, right worse than left. Musculoskeletal. Again noted is a intratrochanteric fracture of the proximal left femur. There is no significant interval healing. The patient is status post prior intramedullary nail placement.1 there is a rim enhancing collection with a pocket of gas coursing from the superior aspect of the greater trochanter towards the lateral proximal thigh measuring approximately 8 cm in length. At the lesser trochanter there is a non rim enhancing 4 cm collection (axial series 3, image 98). There is surrounding soft tissue swelling. There is hypoattenuation of the gluteal musculature. There is some lucency involving the greater trochanter and adjacent fracture fragments. There is a healed fracture of the left inferior pubic ramus. IMPRESSION: 1. Nonspecific bilateral flank edema is noted, right worse than left. 2. There is a rim enhancing collection extending from the greater trochanter of the left hip towards the lateral aspect of the proximal left thigh, concerning for an abscess. 3. There is a nonenhancing 4 cm collection adjacent to the lesser trochanter of the left hip which may represent a postoperative seroma or hematoma. 4. There are areas of lucency involving the proximal left femur which may be related to the patient's prior fracture, however in the setting of an adjacent abscess, osteomyelitis is not excluded. 5. Hypoattenuating areas throughout the left gluteal musculature is concerning for myositis. 6.  Aortic Atherosclerosis (ICD10-I70.0). Electronically Signed   By: CConstance HolsterM.D.   On: 02/07/2020 01:40   CT ASPIRATION  Result Date: 02/10/2020 INDICATION: Posterior left hip intramuscular abscess EXAM: CT ASPIRATION POSTERIOR LEFT HIP ABSCESS MEDICATIONS: The  patient is currently admitted to the hospital and receiving intravenous antibiotics. The antibiotics were administered within an appropriate time frame prior to the initiation of the procedure. ANESTHESIA/SEDATION: Fentanyl 100 mcg IV; Versed 1.0 mg IV Moderate Sedation Time:  10  MINUTES The patient was continuously monitored during the procedure by the interventional radiology nurse under my direct supervision. COMPLICATIONS: None immediate. PROCEDURE: Informed written consent was obtained from the patient after a thorough discussion of the procedural risks, benefits and alternatives. All questions were addressed. Maximal Sterile Barrier Technique was utilized including caps, mask, sterile gowns, sterile gloves, sterile drape, hand hygiene and skin antiseptic. A timeout was performed prior to the initiation of the procedure. previous imaging reviewed. patient positioned prone. noncontrast localization ct performed. the left posterior hip intramuscular abscess was localized and marked. under sterile conditions and local anesthesia, an 18 gauge 10 cm access was advanced into the small fluid collection. syringe aspiration yielded 40 cc purulent fluid. sample sent for culture. needle removed. patient tolerated the procedure well. no immediate complication. IMPRESSION: Successful CT-guided left hip intramuscular abscess needle aspiration. Sample sent for culture. Electronically Signed   By: Jerilynn Mages.  Shick M.D.   On: 02/10/2020 11:54    Labs:  Basic Metabolic Panel: Recent Labs  Lab 02/17/20 1325 02/20/20 0615  NA 131* 131*  K 3.9 4.3  CL 94* 96*  CO2 27 26  GLUCOSE 119* 91  BUN 39* 55*  CREATININE 6.20* 6.34*  CALCIUM 8.9 9.0  PHOS 3.6 5.1*    CBC: Recent Labs  Lab 02/17/20 1324 02/20/20 0615  WBC 12.0* 9.0  NEUTROABS  --  4.5  HGB 10.4* 11.0*  HCT 34.8* 35.9*  MCV 90.4 89.1  PLT 302 286    CBG: Recent Labs  Lab 02/20/20 0611 02/20/20 1220 02/20/20 1658 02/20/20 2159 02/21/20 0644   GLUCAP 88 94 83 222* 89    Brief HPI:   [name] is a 54 y.o. female with history of T1DM with neuropathy,  HTN, CKD V, left foot [place] amputation with chronic wound and left SFA revascularization. She was admitted on 12/28/2019 after falling onto her left hip with onset of left hip pain and difficulty weightbearing due to left IT hip fracture. She underwent IM nailing left hip by Dr. Doran Durand and postop course significant for issues with acute on chronic renal failure with uremia, progressive leukocytosis and lethargy with metabolic acidosis. She underwent placement of tunneled catheter on 11/08 and hemodialysis was initiated. She was agreeable to have MRI of left foot on 11/07 and was found to have abscess with extensive osteomyelitis left foot.   She was started on IV Ancef antibiotic and BKA recommended for definitive treatment. She was agreeable to undergo left BKA on 11/11 and is NWB LLE. She developed purulent drainage from left hip on 11/18 due to abscess and required I&D with debridement of skin on 11/18. Wound cultures grew out Enterobacter cloacae and ID recommended 4-week course of cefepime and Flagyl for treatment. She has had issues with pain control requiring IV Dilaudid. Therapy was ongoing but patient was limited by pain, weakness and debility. CIR was recommended due to functional decline.   Hospital Course: [name] was admitted to rehab 01/23/2020 for inpatient therapies to consist of PT  and OT at least three hours five days a week. Past admission physiatrist, therapy team and rehab RN have worked together to provide customized collaborative inpatient rehab. She has had issues with abdominal discomfort due to Flagyl and antibiotic regimen was changed to Invanz to avoid side effects and for medication compliance. Drainage from left hip has resolved however she developed significant edema left flank and left thigh. CT of abdomen pelvis was done 12/07 for work up and  revealed nonspecific bilateral flank edema right greater than left with rim-enhancing collection extending from greater trochanter of left hip to his lateral aspect of proximal left thigh concerning for abscess as well as nonenhancing 4 cm collection adjacent to lesser trochanter and areas of lucency involving proximal left femur. Hypoattenuation area seen throughout left gluteal muscular concerning for myositis.  Serial check of CBC has shown persistent white count and repeat showed mild elevation to 12.1 Orthopedics and infectious disease was consulted for input and patient underwent CT-guided aspiration of 40 cc of purulent fluid from left hip abscess on 12/10. Gram stain showed abundant WBC without organisms and cultures were negative. Inflammatory markers are trending downward and Dr. Juleen China recommended 6 weeks course of IV antibiotics with end date 12/30 as well as repeat CT hip prior to discharge to assess fluid collection. Follow up CBC showed leucocytosis has resolved and H/H is improving. CT of hip was ordered prior to d/c but patient declined X ray. Her L-BKA incision is healing well and every other suture was removed 12/16 by ortho with patient to follow up in office in 2 weeks. Follow up   Hemodialysis has been ongoing on MWF at the end of the day to help with activity tolerance.Blood pressures were monitored on TID basis and were noted to be on low side therefore Coreg was decreased to 12.5 mg twice daily. Patient with history of IBS diarrhea and home Lotronex was resumed. She has continued to have problems with constipation alternating with diarrhea during her stay and has required multiple adjustments in bowel regimen. Pain control continues to be poor despite use of oxycodone and tramadol therefore MS Contin was added with more consistent pain relief. Low-dose Elavil was added at bedtime to help manage neuropathy.  Diabetes has been monitored with ac/hs CBG checks and SSI was use prn for  tighter BS control. She had issues with hypoglycemia with increase in activity levels and insulin has been titrated frequently during the stay. Currently blood sugars are managed on 12 units bid. Patient advised to monitor BS ac/hs basis and slowly titrate Lantus to home dose by 5 units every 1-2 days if BS consistently over 120. Colbert Ewing was changed to cefepime to be administered with HD on MWF with end date of 03/01/20. Flagyl not needed per ID. She has made steady gains during her rehab stay and is currently at supervision level. She will continue to receive follow up HHPT and [place].    Rehab course: During patient's stay in rehab weekly team conferences were held to monitor patient's progress, set goals and discuss barriers to discharge. At admission, patient required max to total assist with ADL task and total assist with mobility. She  has had improvement in activity tolerance, balance, postural control as well as ability to compensate for deficits. She is able to complete ADL tasks with CGA to supervision. She requires supervision with tranfers and gait not tested. She is able to propel her wheelchair for 150' at modified independent level. Family education was completed regarding all aspects of care and safety.   Discharge disposition: 01-Home or Self Care  Diet: Renal/Carb Modified-1200 cc FR  Special Instructions: 1. Keep incisions clean and dry. 2. End date antibiotic 12/30.  3. Recommend taper to MS contin to once a day for a week then d/c.     Discharge Instructions    [facility] pharmacist to adjust dose for Vancomycin, Aminoglycosides and other anti-infective therapies as requested by physician.   Complete  by: As directed    [facility] to provide Cath Flo 14m   Complete by: As directed    Administer for PICC line occlusion and as ordered by physician for other access device issues.   Ambulatory referral to Physical Medicine Rehab   Complete by: As directed     1-2 weeks TC appt   Anaphylaxis Kit: Provided to treat any anaphylactic reaction to the medication being provided to the patient if First Dose or when requested by physician   Complete by: As directed    Epinephrine 154mml vial / amp: Administer 0.63m22m0.63ml51mubcutaneously once for moderate to severe anaphylaxis, nurse to call physician and pharmacy when reaction occurs and call 911 if needed for immediate care   Diphenhydramine 50mg18mIV vial: Administer 25-50mg 6mM PRN for first dose reaction, rash, itching, mild reaction, nurse to call physician and pharmacy when reaction occurs   Sodium Chloride 0.9% NS 500ml I71mdminister if needed for hypovolemic blood pressure drop or as ordered by physician after call to physician with anaphylactic reaction   Change dressing on IV access line weekly and PRN   Complete by: As directed    Flush IV access with Sodium Chloride 0.9% and Heparin 10 units/ml or 100 units/ml   Complete by: As directed    Home infusion instructions - [facility]   Complete by: As directed    Instructions: Flush IV access with Sodium Chloride 0.9% and Heparin 10units/ml or 100units/ml   Change dressing on IV access line: Weekly and PRN   Instructions Cath Flo 2mg: Ad5mister for PICC Line occlusion and as ordered by physician for other access device   [facility] pharmacist to adjust dose for: Vancomycin, Aminoglycosides and other anti-infective therapies as requested by physician   Method of administration may be changed at the discretion of home infusion pharmacist based upon assessment of the patient and/or caregiver's ability to self-administer the medication ordered   Complete by: As directed    Outpatient Parenteral Antibiotic Therapy Information Antibiotic: Cefepime (Maxipime) IVPB; Indications for use: Hip Abscess; End Date: 03/01/2020   Complete by: As directed    Antibiotic: Cefepime (Maxipime) IVPB   Indications for use: Hip Abscess   End  Date: 03/01/2020     Allergies as of 02/21/2020      Reactions   Trazodone Swelling   Latex Rash   Lidocaine Itching      Medication List    STOP taking these medications   (feeding supplement) PROSource Plus liquid   aspirin 81 MG chewable tablet Commonly known as: Aspirin Childrens   ceFEPIme 1 g in sodium chloride 0.9 % 100 mL   enoxaparin 30 MG/0.3ML injection Commonly known as: LOVENOX     TAKE these medications   alosetron 1 MG tablet Commonly known as: LOTRONEX Take 1 mg by mouth 2 (two) times daily. Notes to patient: Hold if constipated   amitriptyline 10 MG tablet Commonly known as: ELAVIL Take 1 tablet (10 mg total) by mouth at bedtime.   amLODipine 5 MG tablet Commonly known as: NORVASC Take 1 tablet (5 mg total) by mouth daily. What changed:   medication strength  how much to take   atorvastatin 40 MG tablet Commonly known as: LIPITOR TAKE 1 TABLET (40 MG TOTAL) BY MOUTH DAILY AT 6 PM.   carvedilol 6.25 MG tablet Commonly known as: COREG Take 1 tablet (6.25 mg total) by mouth 2 (two) times daily. What changed:   medication strength  how much to take  when to take this   ceFEPime  IVPB Commonly known as: MAXIPIME Inject 2-3 g into the vein every Monday, Wednesday, and Friday with hemodialysis for 10 days. Give 2g after dialysis on Mondays and Wednesdays; give 3g after dialysis on Fridays  Indication:  Hip abscess First Dose: Yes Last Day of Therapy:  03/01/20 Labs - Once weekly:  CBC/D and BMP, Labs - Every other week:  ESR and CRP Method of administration: IV Push Method of administration may be changed at the discretion of home infusion pharmacist based upon assessment of the patient and/or caregiver's ability to self-administer the medication ordered. Notes to patient: With Hemodialysis--end date 03/01/20   citalopram 20 MG tablet Commonly known as: CELEXA Take 1 tablet (20 mg total) by mouth daily.   cyclobenzaprine 5 MG  tablet Commonly known as: FLEXERIL Take 1 tablet (5 mg total) by mouth 3 (three) times daily as needed for muscle spasms. What changed:   medication strength  how much to take  when to take this   Dexilant 60 MG capsule Generic drug: dexlansoprazole Take 60 mg by mouth daily.   DULoxetine 30 MG capsule Commonly known as: CYMBALTA Take 1 capsule (30 mg total) by mouth daily. What changed:   medication strength  how much to take  how to take this  when to take this  additional instructions Notes to patient: Decreased to renal dose   hydrALAZINE 100 MG tablet Commonly known as: APRESOLINE Take 1 tablet (100 mg total) by mouth 3 (three) times daily.   insulin glargine 100 UNIT/ML injection Commonly known as: LANTUS Inject 0.12 mLs (12 Units total) into the skin at bedtime. What changed: how much to take   Melatonin 300 MCG Tabs Take 5 tablets (1,500 mcg total) by mouth at bedtime. Notes to patient: Purchase over the counter   morphine 15 MG 12 hr tablet--Rx 14 pills.  Commonly known as: MS CONTIN Take 1 tablet (15 mg total) by mouth every 12 (twelve) hours.   Muscle Rub 10-15 % Crea Apply 1 application topically as needed for muscle pain.   oxyCODONE 5 MG immediate release tablet--Rx# 28 pills Commonly known as: Oxy IR/ROXICODONE Take 1-2 tablets (5-10 mg total) by mouth every 6 (six) hours as needed for severe pain. What changed:   when to take this  reasons to take this   sevelamer carbonate 800 MG tablet Commonly known as: RENVELA Take 2 tablets (1,600 mg total) by mouth 3 (three) times daily with meals. What changed: how much to take   sodium chloride 0.65 % Soln nasal spray Commonly known as: OCEAN Place 1 spray into both nostrils as needed for congestion (nose irritation).   traMADol 50 MG tablet--Rx# 28 pills.  Commonly known as: ULTRAM Take 1 tablet (50 mg total) by mouth every 6 (six) hours as needed for moderate pain.             Discharge Care Instructions  (From admission, onward)         Start     Ordered   02/21/20 0000  Change dressing on IV access line weekly and PRN  (Home infusion instructions - [facility] )        02/21/20 1050          Follow-up Information    Wylene Simmer, MD Follow up.   Specialty: Orthopedic Surgery Why: Call for follow up appointment Contact information: [address] [place] [address] [phone]  Lovorn, Jinny Blossom, MD Follow up.   Specialty: Physical Medicine and Rehabilitation Why: Office will call you with follow up appointment Contact information: [address] [place] [address] [phone]        Jeanella Anton, NP. Call.   Specialty: Nurse Practitioner Why: for post hospital follow up/pain management Contact information: [place] [address] [phone]        Golden Circle, FNP Follow up on 02/28/2020.   Specialties: Family Medicine, Infectious Diseases Why: appointment at 9 am for follow up on infection/antibiotic regimen.  Contact information: [address] [place] [address] [phone]               Signed: Bary Leriche 02/22/2020, 4:41 PM

## 2020-02-20 NOTE — Progress Notes (Signed)
   Patient Status: [facility] - In-pt  Assessment and Plan: Patient in need of venous access.   Tunneled central catheter placement  ______________________________________________________________________   History of Present Illness: [name] is a 54 y.o. female   ESRD L BKA 1 mo ago Post infection/abscess Now for long term antibiotics- at home ID following For DC tomorrow  Allergies and medications reviewed.   Review of Systems: A 12 point ROS discussed and pertinent positives are indicated in the HPI above.  All other systems are negative.   Vital Signs: BP (!) 134/56   Pulse (!) 55   Temp 98.5 F (36.9 C)   Resp 17   Ht 5\' 10"  (1.778 m)   Wt 185 lb 13.6 oz (84.3 kg)   SpO2 98%   BMI 26.67 kg/m   Imaging reviewed.   Labs:  COAGS: Recent Labs    12/28/19 1200  INR 1.1    BMP: Recent Labs    02/13/20 0632 02/15/20 1443 02/17/20 1325 02/20/20 0615  NA 129* 133* 131* 131*  K 4.7 4.1 3.9 4.3  CL 92* 96* 94* 96*  CO2 28 27 27 26   GLUCOSE 90 155* 119* 91  BUN 43* 38* 39* 55*  CALCIUM 9.1 8.9 8.9 9.0  CREATININE 7.18* 6.25* 6.20* 6.34*  GFRNONAA 6* 8* 8* 7*   ESRD LBKA 4 weeks ago Post op left hip infection Now for long term antibiotics at home--ID following Scheduled for Tunneled central catheter placement She is aware of procedure benefits and risks including but not limited to : infection; bleeding; vessel damage Agreeable to proceed Consent signed and in chart   Electronically Signed: Lavonia Drafts, PA-C 02/20/2020, 11:07 AM   I spent a total of 15 minutes in face to face in clinical consultation, greater than 50% of which was counseling/coordinating care for venous access.

## 2020-02-20 NOTE — Progress Notes (Signed)
Physical Therapy Discharge Summary  Patient Details  Name: [name] MRN: [id number] Date of Birth: 09-Jan-1966  Today's Date: 02/20/2020 PT Individual Time: 1115-1205 PT Individual Time Calculation (min): 50 min   Skilled Therapeutic Intervention:  Patient seen with daughter, Marolyn Hammock present for training.  Patient seated in w/c and placed legrests on with S and cues.  Propelled x 150' mod I. Performed car transfer to 27" height with mod A (to 24" height with CGA as daughter reports plans to bring her car instead).  Daughter return demonstrated safe assistance level for car transfer.  Demonstrated wheelchair management for parts and placing in vehicle and daughter return demonstrated. Patient sit to stand at sink mod A performed with daughter assistance (as had been trained during OT session).  Patient performed standing L hip abduction, extension and R knee flexion/extension in standing x 5-10 reps.  Performed slide board transfer to bed with S and cues for parts management.  Patient performed HEP as issues for isometric hip extension, glut and quad sets, hip abduction and seated SAQ.  Patient and daughter educated on limb wrapping if wound opened up and unable to use shrinker with handout given.  Patient left in supine with daughter in the room.  Patient has met 3 of 8 long term goals due to improved activity tolerance, improved balance, increased strength, increased range of motion and decreased pain.  Patient to discharge at a wheelchair level Supervision.   Patient's care partner is independent to provide the necessary physical assistance at discharge.  Reasons goals not met: Patient with history of CVA affecting R side and has residual weakness affecting transfers and gait.  She will continue to benefit from HHPT and will have assistance from caregiver for safety at home.  Recommendation:  Patient will benefit from ongoing skilled PT services in home health setting to continue to  advance safe functional mobility, address ongoing impairments in balance, strength, safety, transfers and residual limb care, and minimize fall risk.  Equipment: 18x18 wheelchair w/ residual limb legrest L LE.  Reasons for discharge: discharge from hospital  Patient/family agrees with progress made and goals achieved: Yes  PT Discharge Precautions/Restrictions Precautions Precautions: Fall Precaution Comments: new L BKA, inconsistent hip pain Other Brace: L LE limb guard Restrictions Weight Bearing Restrictions: Yes LLE Weight Bearing: Non weight bearing Pain Pain Assessment Pain Scale: 0-10 Pain Score: 7  Pain Type: Surgical pain Pain Location: Hip Pain Orientation: Left Pain Descriptors / Indicators: Spasm Pain Onset: With Activity Pain Intervention(s): RN made aware;Repositioned;Rest Vision/Perception  Perception Perception: Within Functional Limits Praxis Praxis: Intact  Cognition Overall Cognitive Status: Within Functional Limits for tasks assessed Arousal/Alertness: Awake/alert Orientation Level: Oriented X4 Memory: Appears intact Awareness: Appears intact Problem Solving: Appears intact Safety/Judgment: Appears intact Sensation Sensation Light Touch: Impaired Detail Peripheral sensation comments: decreased at R toes, denies phantom sensation Light Touch Impaired Details: Absent RLE Coordination Gross Motor Movements are Fluid and Coordinated: No Fine Motor Movements are Fluid and Coordinated: Yes Coordination and Movement Description: decreased coordination at times, sequencing transfers, pending pain levels Motor  Motor Motor: Abnormal postural alignment and control Motor - Discharge Observations: generalized weakness, but improved from eval, L hip pain inconsistent, but improved as well  Mobility Bed Mobility Bed Mobility: Sit to Supine;Supine to Sit Supine to Sit: Independent with assistive device Sit to Supine: Independent with assistive  device Transfers Transfers: Sit to Stand Sit to Stand: Moderate Assistance - Patient 50-74% Transfer (Assistive device): Other (Comment) (sink, counter) Locomotion  Gait Ambulation: No Gait Gait: No Stairs / Additional Locomotion Stairs: No Architect: Yes Wheelchair Assistance: Independent with Camera operator: Both upper extremities Wheelchair Parts Management: Independent Distance: 150'  Trunk/Postural Assessment  Cervical Assessment Cervical Assessment: Within Functional Limits Thoracic Assessment Thoracic Assessment: Exceptions to [facility] (rounded shoulders) Lumbar Assessment Lumbar Assessment: Exceptions to [facility] (posterior pevic tilt) Postural Control Postural Control: Within Functional Limits  Balance Balance Balance Assessed: Yes Static Sitting Balance Static Sitting - Balance Support: Feet unsupported;No upper extremity supported Static Sitting - Level of Assistance: 7: Independent Dynamic Sitting Balance Dynamic Sitting - Balance Support: During functional activity;Left upper extremity supported Dynamic Sitting - Level of Assistance: 5: Stand by assistance Sitting balance - Comments: leaning for board placement Static Standing Balance Static Standing - Balance Support: Bilateral upper extremity supported;During functional activity Static Standing - Level of Assistance: 4: Min assist Static Standing - Comment/# of Minutes: standing for LE therex Extremity Assessment      RLE Assessment RLE Assessment: Exceptions to [facility] Active Range of Motion (AROM) Comments: AROM WFL General Strength Comments: hip flexion 4-/5, knee extension 4+/5, ankle DF 4/5 LLE Assessment LLE Assessment: Exceptions to [facility] Active Range of Motion (AROM) Comments: AROM hip flexion limted by pain, AAROM WFL, knee flexion about 60 General Strength Comments: hip flexion 3/5, knee extension 4-/5, flexion 3/5    [address]  [place],  PT 02/20/2020, 1:00 PM

## 2020-02-20 NOTE — Progress Notes (Signed)
Notified by CT technician for number to call ordering PA for verification of CT Abdomen/Pelvis order schedule to be done tonight

## 2020-02-20 NOTE — Progress Notes (Signed)
Notified on call [name] Angiulli,PA) , patient refuse CT Abdomen/Pelvis procedure, will follow up in the morning with this patient. 9:06pm CT department notified of cancellation by patient.

## 2020-02-20 NOTE — Progress Notes (Addendum)
[facility] PHYSICAL MEDICINE & REHABILITATION PROGRESS NOTE   Subjective/Complaints: No complaints this morning. She wants to make sure that her Amitriptyline will be prescribed for her upon discharge, as this has been helping her to sleep better.  Pain has been stable.   ROS:   Pt denies SOB, abd pain, CP, N/V/C/D, and vision changes, pain is stable.   Objective:   No results found. Recent Labs    02/17/20 1324 02/20/20 0615  WBC 12.0* 9.0  HGB 10.4* 11.0*  HCT 34.8* 35.9*  PLT 302 286   Recent Labs    02/17/20 1325 02/20/20 0615  NA 131* 131*  K 3.9 4.3  CL 94* 96*  CO2 27 26  GLUCOSE 119* 91  BUN 39* 55*  CREATININE 6.20* 6.34*  CALCIUM 8.9 9.0    Intake/Output Summary (Last 24 hours) at 02/20/2020 1017 Last data filed at 02/20/2020 0813 Gross per 24 hour  Intake 467 ml  Output --  Net 467 ml     Pressure Injury 01/23/20 Buttocks Left Deep Tissue Pressure Injury - Purple or maroon localized area of discolored intact skin or blood-filled blister due to damage of underlying soft tissue from pressure and/or shear. Circular, dark, red area, surrounde (Active)  01/23/20 1609  Location: Buttocks  Location Orientation: Left  Staging: Deep Tissue Pressure Injury - Purple or maroon localized area of discolored intact skin or blood-filled blister due to damage of underlying soft tissue from pressure and/or shear.  Wound Description (Comments): Circular, dark, red area, surrounded by MASD  Present on Admission: Yes    Physical Exam: Vital Signs Blood pressure (!) 134/56, pulse (!) 55, temperature 98.5 F (36.9 C), resp. rate 17, height _0  (1.778 m), weight 84.3 kg, SpO2 98 %. Gen: no distress, normal appearing HEENT: oral mucosa pink and moist, NCAT Cardio: Bradycardic Chest: normal effort, normal rate of breathing Abd: soft, non-distended Ext: no edema Psych: flat affect,unhappy I asked her to get out of bed more Skin: Warm and dry.  L BKA-  Well shaped  with shrinker- 1/2 of staples removed- looks good.    L hip looks great- sutures out-steristrips-  L thigh with no swelling or drainage Musc: Left BKA with mild edema and tenderness LLE less swollen/edematous-  Neuro: Alert Motor: Right lower extremity: Hip flexion, knee extension 4-5/5 (pain inhibition), ankle dorsiflexion 4+/5, stable exam Left lower extremity: Hip flexion, knee extension 3/5 (some pain inhibition), ankle dorsiflexion 4/5   Assessment/Plan: 1. Functional deficits which require 3+ hours per day of interdisciplinary therapy in a comprehensive inpatient rehab setting.  Physiatrist is providing close team supervision and 24 hour management of active medical problems listed below.  Physiatrist and rehab team continue to assess barriers to discharge/monitor patient progress toward functional and medical goals  Care Tool:  Bathing    Body parts bathed by patient: Right arm,Left arm,Chest,Abdomen,Face,Right upper leg,Left upper leg,Right lower leg,Left lower leg,Buttocks   Body parts bathed by helper: Buttocks,Right lower leg Body parts n/a: Left lower leg   Bathing assist Assist Level: Contact Guard/Touching assist     Upper Body Dressing/Undressing Upper body dressing   What is the patient wearing?: Pull over shirt    Upper body assist Assist Level: Set up assist    Lower Body Dressing/Undressing Lower body dressing      What is the patient wearing?: Pants     Lower body assist Assist for lower body dressing: Maximal Assistance - Patient 25 - 49% (from sit to stand  level today)     Naval architect Activity did not occur Landscape architect and hygiene only): N/A (no void or bm)  Toileting assist Assist for toileting: Maximal Assistance - Patient 25 - 49% Assistive Device Comment: female urinal   Transfers Chair/bed transfer  Transfers assist  Chair/bed transfer activity did not occur: Safety/medical concerns  Chair/bed transfer  assist level: Moderate Assistance - Patient 50 - 74% (stand pivot with RW) Chair/bed transfer assistive device: Programmer, multimedia   Ambulation assist   Ambulation activity did not occur: Safety/medical concerns  Assist level: 2 helpers Assistive device: Walker-rolling Max distance: 6'   Walk 10 feet activity   Assist  Walk 10 feet activity did not occur: Safety/medical concerns        Walk 50 feet activity   Assist Walk 50 feet with 2 turns activity did not occur: Safety/medical concerns         Walk 150 feet activity   Assist Walk 150 feet activity did not occur: Safety/medical concerns         Walk 10 feet on uneven surface  activity   Assist Walk 10 feet on uneven surfaces activity did not occur: Safety/medical concerns         Wheelchair     Assist Will patient use wheelchair at discharge?: Yes Type of Wheelchair: Manual    Wheelchair assist level: Supervision/Verbal cueing Max wheelchair distance: 120'    Wheelchair 50 feet with 2 turns activity    Assist        Assist Level: Supervision/Verbal cueing   Wheelchair 150 feet activity     Assist      Assist Level: Supervision/Verbal cueing   Medical Problem List and Plan: 1.  Impaired mobility and ADLs secondary to L BKA  Continue CIR 2.  Antithrombotics: -DVT/anticoagulation:  Pharmaceutical: Lovenox             -antiplatelet therapy: N/A 3. Chronic pain/Pain Management: Was on oxycodone 5 mg tid prn as well Cymbalta for neuropathy (at baseline)- now on 10-15 mg q4 hours prn  12/2- will start MS Contin 15 mg BID  12/3-5- MUCH improved pain for pt- con't MS Contin  12/9- pain a little worse today- change flexeril to TID prn  12/17: pain is well controlled- continue current regimen.   12/18- pain controlled almost ready for d/c. Send home on current meds- might be able to wean soon after d/c.   12/20: pain well controlled, continue current regimen 4.  Mood: Team to provide ego support/encouragement.              -antipsychotic agents: N/A 5. Neuropsych: This patient is capable of making decisions on her own behalf. 6. Skin/Wound Care: Monitor wound daily   Continue stump shrinker   12/15- will call Vascular again and see if we can remove staples- we called- they said they would come see pt.   12/16- took out 1/2 staples- will remove rest in 2 weeks at f/u.  7. Fluids/Electrolytes/Nutrition: Strict I/O. 1200 cc FR. Will consult dietician to educate on renal diet.  8. Left hip ORIF/hip abscess s/p I&D 11/18: NWB LLE.   Flagyl changed to Ertepenem due to patient refusal, continue  12/1- on Ertepenem until end of December.  12/7- pt has new rim enhancing lesions in L hip, thigh/glute, etc- have called Ortho and also calling ID to discuss- might have been since switched off Flagyl?   12/9- ID suggested going to IR to get abscesses  drained- WBC is still 12.1k- con't Ertepenem  12/10- IR today for draining abscesses  12/11: CBC ordered for Monday.   12/13- WBC down to 10.4  12/16- WBC 11.5- is back up some- will check CRP and ESR to see what else might be going on.  12/20: WBC down to 9, repeat in outpatient setting.  9. 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   12/18- WBC up slightly to 12k- con't Ertapenem- needs IJ PICC- placed order- per renal 10. T1DM with neuropathy: Hgb A1c-6.2. Was on Lantus 50 units in am with 10 units novolog tid.     Lantus 38 twice daily, decreased to 10 on 11/27, increased to 15 on 11/28  NovoLog 5 units with breakfast, 8 units with lunch and dinner, decreased to 5 3 times daily on 11/27, DC'd on 11/27.  12/8- Had hypoglycemia x1- of 69- con't regimen for now  12/12: CBGs 101-150: educated patient regarding goal CBGs and benefits of no  added sugar in diet.  12/17: CBGs ranging from 88- 215: continue current regimen  12/18- BGs 75-281- otherwise below 120- con't regimen 11. Now ESRD: Now on HD MWF (set up at [place] post discharge)  12/18- to start HD outpt on Wednesday.   12. Anemia of chronic disease/iron deficiency: Hgb-6.3 at admission--->9.1 s/p 4 units PRBC. Feraheme X 1 (additional dose held due to infection) and Aranesp weekly.   Hemoglobin 9.7 on 11/26, repeat CBC ordered for Monday.   12/13- Hb 10.1- improving  12/20: Hgb up to 11, monitor in outpatient setting  Continue to monitor 13. HTN: Monitor BP tid--continue Coreg bid, Norvasc and apresoline tid.   Decreased Coreg to 12.34m BID.   135/36 diastolic soft and pulse bradycardic: decrease coreg to 6.258mBID.  14. IBS-D:   MiraLAX DC'd  Resumed Lotrenex    Colace as needed ordered  12/2- will make colace scheduled and add sorbitol if no BM by 3pm  12/4 changed colace to senna-s given addn of MS Contin  12/15- refusing ALL laxatives- due to IBS with diarrhea, however no BM since Sunday- explained if doesn't go today, will need to take something  12/16- Actually said had small BM Tuesday, but "needs to go today"- which I've heard for 3 days in a row- if no BM by 5pm, will order sorbitol.   1218- LBM yesterday con't regimen 15. Malnutrition: Continue protein supplement.  16. Abdominal pain: Due to diarrhea? PPI resumed-->patient to have family bring in [place] home.  17.  Sleep disturbance:   Low dose elavil at bedtime.  Melatonin added on 11/26  Improving overall 18. Leukocytosis:   WBCs 11.6 on 11/26, labs with HD  See #8, #9  Afebrile  12/3- WBC 11.1- afebrile; no Sx's- con't to monitor  12/6- WBC 11.3- cause??  12/7- WBC 12.0- due to abscesses in L thigh, glute, etc causing edema- have contacted Ortho AND ID- waiting to hear back.   12/9- ID is calling IR to get abscesses drained.  12/10- IR doing IR drainage today  12/14- 40 cc  purulent drainage- no growth to date.  19. LE/thigh edema  12/6- will check CT of abd/pelvis with contrast to determine cause-got OK by nephrology to use contrast- has 2-3+ edema of thighs L>R  12/7- due to rim enhancing  lesions- as above.   12/8- will con't Ertepenem- per ID.   12/13- s/p IR- WBC 10.4- con't Ertepenem  12/18- WBC 12k- checking labs Monday- - also needs PICC- IJ type 20. Depression-new-  12/8- will add Celexa 20 mg daily- max DOSE  12/9- had to decrease Duloxetine to 30 mg daily.    12/15- slightly more interactive   LOS: 28 days A FACE TO FACE EVALUATION WAS PERFORMED  Martha Clan P Dulcie Gammon 02/20/2020, 10:17 AM

## 2020-02-20 NOTE — Progress Notes (Addendum)
Inpatient Rehabilitation Care Coordinator  Discharge Note  The overall goal for the admission was met for:   Discharge location: Yes-HOME WITH DAUGHTER WHO IS THERE IN THE EVENINGS  Length of Stay: Yes-29 DAYS  Discharge activity level: Yes-MOD/I TRANSFERS AND SUPERVISION-MIN SIT TO STAND  Home/community participation: Yes  Services provided included: MD, RD, PT, OT, RN, CM, TR, Pharmacy, Neuropsych and SW  Financial Services: Medicaid  Follow-up services arranged: Home Health: [facility], DME: [facility] BED[employer]E COM[employer] AND 24 TRASNFER BOARD.  STALLS-WHEELCHAIR and Patient/Family has no preference for HH/DME agencies. NEW HD PT AWARE OF M, W, F AT [facility] HD SLOT.  WILL GET IV ANTIBIOTICS AT HD THREE DAYS PER WEEK  Comments (or additional information):DAUGHTER WAS IN FOR EDUCATION AND BOTH FEEL IT WENT WELL. RAMP BEING INSTALLED AT HOME FOR PT TO BE ABLE TO GET IN AND OUT OF THE HOME.   Patient/Family verbalized understanding of follow-up arrangements: Yes  Individual responsible for coordination of the follow-up plan: SELF [phone] KAITLYN-[phone]  Confirmed correct DME delivered: Elease Hashimoto 02/20/2020    Izetta Sakamoto, [place] Rhyme

## 2020-02-20 NOTE — Progress Notes (Signed)
[facility] Progress Note   Assessment/ Plan:   Problems: # AoCKD 5,now ESRD: Baseline Creat 4.9- 5.2, eGFR 9-11 from 8/21 - 10/21. Creat 6 on admission, then mid 7's, IVF's given w/o improvementso started HD.Has been clipped to [facility].S/p [facility] and AVF on11/8, 1st HD 11/9. Continue MWF schedule here. HD today on schedule  #Anemiaof ESRD- Hgb6.3 on admit. S/p 4U PRBCs.Tsat low 10%, got 549m Feraheme on 11/6, holding further doses due to infection. IncreasedAranesp 60 to 100 q week on 11/17.  Monitor hemoglobin.  #L foot stump chronic open wound w/ poss osteo by plain films-> s/pL BKA 01/12/20  #L hip fracture - admission diagnosis.S/p ORIF on 10/28 per ortho.F/b ID. Complicated by wound infection with Enterobacter cloacae and Bacteroides. Status post I&D 11/18. Currently on antibiotics for planned 6-week course through 12/30. Further complicated by abscess in the left hip status post IR aspiration 12/10 with 40 cc of purulent fluid. Cultures no growth to date. Pt needs Invanz (didn't tolerate ceph/ flagyl regimen) and this can't be done in the OP HD unit--> IJ PICC ordered for today by PMR MD.   #HTN/ vol -lowered amlodipine dose.  Also on hydralazine,coreg and norvasc.  UF during HD.  #BMM -monitor calcium and phosphorus level.  Continue sevelamer.  PTH 44.   #Hyponatremia, hypervolemic: She has high intradialytic weight gain.  Discussed about fluid restriction.  UF as tolerated.  #Nutrition: albumin very low, on protein supplement.Renal diet w/fluid restrictions.  Subjective:    Seen in room- about to work with OT.  For d/c tomorrow.     Objective:   BP (!) 134/56   Pulse (!) 55   Temp 98.5 F (36.9 C)   Resp 17   Ht '5\' 10"'  (1.778 m)   Wt 84.3 kg   SpO2 98%   BMI 26.67 kg/m   Physical Exam: Gen: NAD, sitting in bed CVS: RRR Resp: clear Abd: soft Ext: no LE edema, s/p L BKA ACCESS: R IJ TDC, L AVF  maturing  Labs: BMET Recent Labs  Lab 02/15/20 1443 02/17/20 1325 02/20/20 0615  NA 133* 131* 131*  K 4.1 3.9 4.3  CL 96* 94* 96*  CO2 '27 27 26  ' GLUCOSE 155* 119* 91  BUN 38* 39* 55*  CREATININE 6.25* 6.20* 6.34*  CALCIUM 8.9 8.9 9.0  PHOS 3.8 3.6 5.1*   CBC Recent Labs  Lab 02/15/20 1443 02/17/20 1324 02/20/20 0615  WBC 11.5* 12.0* 9.0  NEUTROABS  --   --  4.5  HGB 10.0* 10.4* 11.0*  HCT 33.4* 34.8* 35.9*  MCV 90.0 90.4 89.1  PLT 247 302 286      Medications:    . (feeding supplement) PROSource Plus  30 mL Oral BID BM  . alosetron  1 mg Oral BID  . amitriptyline  10 mg Oral QHS  . amLODipine  5 mg Oral Daily  . atorvastatin  40 mg Oral q1800  . carvedilol  6.25 mg Oral BID WC  . Chlorhexidine Gluconate Cloth  6 each Topical BID  . citalopram  20 mg Oral Daily  . darbepoetin (ARANESP) injection - DIALYSIS  100 mcg Intravenous Q Mon-HD  . DULoxetine  30 mg Oral Daily  . heparin sodium (porcine)  1,000 Units Intravenous Q M,W,F-HD  . hydrALAZINE  100 mg Oral TID  . insulin aspart  0-5 Units Subcutaneous QHS  . insulin aspart  0-9 Units Subcutaneous TID WC  . insulin glargine  15 Units Subcutaneous QHS  .  melatonin  1.5 mg Oral QHS  . multivitamin  1 tablet Oral QHS  . oxyCODONE  10 mg Oral Q12H  . pantoprazole  40 mg Oral Daily  . sevelamer carbonate  1,600 mg Oral TID WC  . sorbitol  30 mL Oral Once     Madelon Lips, MD 02/20/2020, 11:05 AM

## 2020-02-20 NOTE — Progress Notes (Addendum)
Patient ID: [name], female   DOB: November 07, 1965, 54 y.o.   MRN: [id number] Met with pt and daughter who is here for education in preparation for discharge tomorrow. Both feel it went well and feel comfortable with care needs. Ramp being placed today. Pam-PA reports per nephrology she will need IV antibiotics at discharge and will not be receiving these at HD. Have contacted Pam-IV regarding setting up and education. Pt having a PICC line placed today for IV antibiotics at discharge. Continue to follow until discharge tomorrow.  12:49 AM ID and HD spoke and will change IV antibiotics and be able to give her this while in HD 3 x week. PICC line order to be cancelled.

## 2020-02-21 ENCOUNTER — Inpatient Hospital Stay: Payer: Medicaid Other | Admitting: Internal Medicine

## 2020-02-21 DIAGNOSIS — D509 Iron deficiency anemia, unspecified: Secondary | ICD-10-CM | POA: Diagnosis present

## 2020-02-21 LAB — GLUCOSE, CAPILLARY: Glucose-Capillary: 89 mg/dL (ref 70–99)

## 2020-02-21 MED ORDER — MELATONIN 300 MCG PO TABS: 1.5000 mg | ORAL_TABLET | Freq: Every day | ORAL | 0 refills | Status: DC

## 2020-02-21 MED ORDER — TRAMADOL HCL 50 MG PO TABS: 50.0000 mg | ORAL_TABLET | Freq: Four times a day (QID) | ORAL | 0 refills | Status: DC | PRN

## 2020-02-21 MED ORDER — AMITRIPTYLINE HCL 10 MG PO TABS: 10.0000 mg | ORAL_TABLET | Freq: Every day | ORAL | 0 refills | Status: DC

## 2020-02-21 MED ORDER — CYCLOBENZAPRINE HCL 5 MG PO TABS
5.0000 mg | ORAL_TABLET | Freq: Three times a day (TID) | ORAL | 0 refills | Status: DC | PRN
Start: 2020-02-21 — End: 2021-08-07

## 2020-02-21 MED ORDER — CEFEPIME IV (FOR PTA / DISCHARGE USE ONLY): 2.0000 g | INTRAVENOUS | 0 refills | Status: AC

## 2020-02-21 MED ORDER — SEVELAMER CARBONATE 800 MG PO TABS: 1600.0000 mg | ORAL_TABLET | Freq: Three times a day (TID) | ORAL | 0 refills | Status: DC

## 2020-02-21 MED ORDER — CARVEDILOL 6.25 MG PO TABS: 6.2500 mg | ORAL_TABLET | Freq: Two times a day (BID) | ORAL | 0 refills | Status: DC

## 2020-02-21 MED ORDER — HYDRALAZINE HCL 100 MG PO TABS: 100.0000 mg | ORAL_TABLET | Freq: Three times a day (TID) | ORAL | 0 refills | Status: DC

## 2020-02-21 MED ORDER — CITALOPRAM HYDROBROMIDE 20 MG PO TABS: 20.0000 mg | ORAL_TABLET | Freq: Every day | ORAL | 0 refills | Status: DC

## 2020-02-21 MED ORDER — OXYCODONE HCL 5 MG PO TABS: 5.0000 mg | ORAL_TABLET | Freq: Four times a day (QID) | ORAL | 0 refills | Status: DC | PRN

## 2020-02-21 MED ORDER — AMLODIPINE BESYLATE 5 MG PO TABS: 5.0000 mg | ORAL_TABLET | Freq: Every day | ORAL | 0 refills | Status: DC

## 2020-02-21 MED ORDER — MORPHINE SULFATE ER 15 MG PO TBCR: 15.0000 mg | EXTENDED_RELEASE_TABLET | Freq: Two times a day (BID) | ORAL | 0 refills | Status: DC

## 2020-02-21 MED ORDER — INSULIN GLARGINE 100 UNIT/ML ~~LOC~~ SOLN: 12.0000 [IU] | Freq: Every day | SUBCUTANEOUS | 11 refills | Status: DC

## 2020-02-21 MED ORDER — DULOXETINE HCL 30 MG PO CPEP: 30.0000 mg | ORAL_CAPSULE | Freq: Every day | ORAL | 3 refills | Status: DC

## 2020-02-21 NOTE — Progress Notes (Signed)
[place] PHYSICAL MEDICINE & REHABILITATION PROGRESS NOTE   Subjective/Complaints:  Pt reports after d/w her about the MS Contin- that pharmacy wanted me to change her to OXycontin, she DOESN'T want to change- esp since she's not going to be on it long term - only ~ 2 more weeks.   Pt refused CT scan last night- absolutely says not doing another one/work up and won't stay longer.    ROS:   Pt denies SOB, abd pain, CP, N/V/C/D, and vision changes   Objective:   No results found. Recent Labs    02/20/20 0615  WBC 9.0  HGB 11.0*  HCT 35.9*  PLT 286   Recent Labs    02/20/20 0615  NA 131*  K 4.3  CL 96*  CO2 26  GLUCOSE 91  BUN 55*  CREATININE 6.34*  CALCIUM 9.0    Intake/Output Summary (Last 24 hours) at 02/21/2020 5465 Last data filed at 02/21/2020 0715 Gross per 24 hour  Intake 398 ml  Output 3000 ml  Net -2602 ml     Pressure Injury 01/23/20 Buttocks Left Deep Tissue Pressure Injury - Purple or maroon localized area of discolored intact skin or blood-filled blister due to damage of underlying soft tissue from pressure and/or shear. Circular, dark, red area, surrounde (Active)  01/23/20 1609  Location: Buttocks  Location Orientation: Left  Staging: Deep Tissue Pressure Injury - Purple or maroon localized area of discolored intact skin or blood-filled blister due to damage of underlying soft tissue from pressure and/or shear.  Wound Description (Comments): Circular, dark, red area, surrounded by MASD  Present on Admission: Yes    Physical Exam: Vital Signs Blood pressure (!) 146/54, pulse 60, temperature 98.5 F (36.9 C), temperature source Oral, resp. rate 18, height '5\' 10"'  (1.778 m), weight 82.8 kg, SpO2 97 %. Gen: no distress, normal appearing- sitting up in bed, NAD HEENT: oral mucosa pink and moist, NCAT Cardio: bradycardic Chest: CTA B/L- no W/R/R- good air movement Abd: Soft, NT, ND, (+)BS  Ext: no edema Psych: flat affect Skin: Warm and dry.   L BKA-  Well shaped with shrinker- 1/2 of staples removed- looks good.    L hip looks great- sutures out-steristrips-  L thigh with no swelling or drainage Musc: Left BKA with mild edema and tenderness LLE less swollen/edematous-  Neuro: Alert Motor: Right lower extremity: Hip flexion, knee extension 4-5/5 (pain inhibition), ankle dorsiflexion 4+/5, stable exam Left lower extremity: Hip flexion, knee extension 3/5 (some pain inhibition), ankle dorsiflexion 4/5   Assessment/Plan: 1. Functional deficits which require 3+ hours per day of interdisciplinary therapy in a comprehensive inpatient rehab setting.  Physiatrist is providing close team supervision and 24 hour management of active medical problems listed below.  Physiatrist and rehab team continue to assess barriers to discharge/monitor patient progress toward functional and medical goals  Care Tool:  Bathing    Body parts bathed by patient: Right arm,Left arm,Chest,Abdomen,Face,Right upper leg,Left upper leg,Right lower leg,Left lower leg,Buttocks   Body parts bathed by helper: Buttocks,Right lower leg Body parts n/a: Left lower leg   Bathing assist Assist Level: Contact Guard/Touching assist     Upper Body Dressing/Undressing Upper body dressing   What is the patient wearing?: Pull over shirt    Upper body assist Assist Level: Set up assist    Lower Body Dressing/Undressing Lower body dressing      What is the patient wearing?: Pants     Lower body assist Assist for lower body  dressing: Maximal Assistance - Patient 25 - 49% (from sit to stand level today)     Toileting Toileting Toileting Activity did not occur (Clothing management and hygiene only): N/A (no void or bm)  Toileting assist Assist for toileting: Maximal Assistance - Patient 25 - 49% Assistive Device Comment: female urinal   Transfers Chair/bed transfer  Transfers assist  Chair/bed transfer activity did not occur: Safety/medical  concerns  Chair/bed transfer assist level: Supervision/Verbal cueing Chair/bed transfer assistive device: Sliding board   Locomotion Ambulation   Ambulation assist   Ambulation activity did not occur: Safety/medical concerns  Assist level: 2 helpers Assistive device: Walker-rolling Max distance: 6'   Walk 10 feet activity   Assist  Walk 10 feet activity did not occur: Safety/medical concerns        Walk 50 feet activity   Assist Walk 50 feet with 2 turns activity did not occur: Safety/medical concerns         Walk 150 feet activity   Assist Walk 150 feet activity did not occur: Safety/medical concerns         Walk 10 feet on uneven surface  activity   Assist Walk 10 feet on uneven surfaces activity did not occur: Safety/medical concerns         Wheelchair     Assist Will patient use wheelchair at discharge?: Yes Type of Wheelchair: Manual    Wheelchair assist level: Independent Max wheelchair distance: 150'    Wheelchair 50 feet with 2 turns activity    Assist        Assist Level: Independent   Wheelchair 150 feet activity     Assist      Assist Level: Independent   Medical Problem List and Plan: 1.  Impaired mobility and ADLs secondary to L BKA  Continue CIR 2.  Antithrombotics: -DVT/anticoagulation:  Pharmaceutical: Lovenox             -antiplatelet therapy: N/A 3. Chronic pain/Pain Management: Was on oxycodone 5 mg tid prn as well Cymbalta for neuropathy (at baseline)- now on 10-15 mg q4 hours prn  12/2- will start MS Contin 15 mg BID  12/3-5- MUCH improved pain for pt- con't MS Contin  12/9- pain a little worse today- change flexeril to TID prn  12/17: pain is well controlled- continue current regimen.   12/18- pain controlled almost ready for d/c. Send home on current meds- might be able to wean soon after d/c.   12/20: pain well controlled, continue current regimen  12/21- pharmacy concerned pt on MS Contin-  not supposed to be on it with ESRD< sin spite of tolerating it- pt emphatic since only 2 more weeks, doesn't want to change to Oxycontin- will con't MS Contin for 2 more weeks, then stop and continue prn oxycodone.  4. Mood: Team to provide ego support/encouragement.              -antipsychotic agents: N/A 5. Neuropsych: This patient is capable of making decisions on her own behalf. 6. Skin/Wound Care: Monitor wound daily   Continue stump shrinker   12/15- will call Vascular again and see if we can remove staples- we called- they said they would come see pt.   12/16- took out 1/2 staples- will remove rest in 2 weeks at f/u.  7. Fluids/Electrolytes/Nutrition: Strict I/O. 1200 cc FR. Will consult dietician to educate on renal diet.  8. Left hip ORIF/hip abscess s/p I&D 11/18: NWB LLE.   Flagyl changed to Ertepenem due to patient  refusal, continue  12/1- on Ertepenem until end of December.  12/7- pt has new rim enhancing lesions in L hip, thigh/glute, etc- have called Ortho and also calling ID to discuss- might have been since switched off Flagyl?   12/9- ID suggested going to IR to get abscesses drained- WBC is still 12.1k- con't Ertepenem  12/10- IR today for draining abscesses  12/11: CBC ordered for Monday.   12/13- WBC down to 10.4  12/16- WBC 11.5- is back up some- will check CRP and ESR to see what else might be going on.  12/20: WBC down to 9, repeat in outpatient setting.  12/21- pt changed to Cefipime when leaves with HD, so doesn't need a line- no Flagyl- end date 12/30   9. 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   12/18- WBC up slightly to 12k- con't Ertapenem- needs IJ PICC- placed order- per renal  12/21- WBC down to 9k - as above.  10. T1DM with neuropathy: Hgb A1c-6.2. Was on Lantus 50 units in am  with 10 units novolog tid.     Lantus 38 twice daily, decreased to 10 on 11/27, increased to 15 on 11/28  NovoLog 5 units with breakfast, 8 units with lunch and dinner, decreased to 5 3 times daily on 11/27, DC'd on 11/27.  12/8- Had hypoglycemia x1- of 69- con't regimen for now  12/12: CBGs 101-150: educated patient regarding goal CBGs and benefits of no added sugar in diet.  12/17: CBGs ranging from 88- 215: continue current regimen  12/18- BGs 75-281- otherwise below 120- con't regimen 11. Now ESRD: Now on HD MWF (set up at [facility] post discharge)  12/18- to start HD outpt on Wednesday.   12. Anemia of chronic disease/iron deficiency: Hgb-6.3 at admission--->9.1 s/p 4 units PRBC. Feraheme X 1 (additional dose held due to infection) and Aranesp weekly.   Hemoglobin 9.7 on 11/26, repeat CBC ordered for Monday.   12/13- Hb 10.1- improving  12/20: Hgb up to 11, monitor in outpatient setting  Continue to monitor 13. HTN: Monitor BP tid--continue Coreg bid, Norvasc and apresoline tid.   Decreased Coreg to 12.13m BID.   180/16 diastolic soft and pulse bradycardic: decrease coreg to 6.258mBID.  14. IBS-D:   MiraLAX DC'd  Resumed Lotrenex    Colace as needed ordered  12/2- will make colace scheduled and add sorbitol if no BM by 3pm  12/4 changed colace to senna-s given addn of MS Contin  12/15- refusing ALL laxatives- due to IBS with diarrhea, however no BM since Sunday- explained if doesn't go today, will need to take something  12/16- Actually said had small BM Tuesday, but "needs to go today"- which I've heard for 3 days in a row- if no BM by 5pm, will order sorbitol.   1218- LBM yesterday con't regimen 15. Malnutrition: Continue protein supplement.  16. Abdominal pain: Due to diarrhea? PPI resumed-->patient to have family bring in [place] home.  17.  Sleep disturbance:   Low dose elavil at bedtime.  Melatonin added on 11/26  Improving overall 18. Leukocytosis:    WBCs 11.6 on 11/26, labs with HD  See #8, #9  Afebrile  12/3- WBC 11.1- afebrile; no Sx's- con't to monitor  12/6- WBC 11.3- cause??  12/7- WBC 12.0-  due to abscesses in L thigh, glute, etc causing edema- have contacted Ortho AND ID- waiting to hear back.   12/9- ID is calling IR to get abscesses drained.  12/10- IR doing IR drainage today  12/14- 40 cc purulent drainage- no growth to date.  19. LE/thigh edema  12/6- will check CT of abd/pelvis with contrast to determine cause-got OK by nephrology to use contrast- has 2-3+ edema of thighs L>R  12/7- due to rim enhancing lesions- as above.   12/8- will con't Ertepenem- per ID.   12/13- s/p IR- WBC 10.4- con't Ertepenem  12/18- WBC 12k- checking labs Monday- - also needs PICC- IJ type 20. Depression-new-  12/8- will add Celexa 20 mg daily- max DOSE  12/9- had to decrease Duloxetine to 30 mg daily.    12/15- slightly more interactive   LOS: 29 days A FACE TO FACE EVALUATION WAS PERFORMED  [name] 02/21/2020, 8:33 AM

## 2020-02-21 NOTE — Progress Notes (Signed)
PHARMACY CONSULT NOTE FOR:  OUTPATIENT  PARENTERAL ANTIBIOTIC THERAPY (OPAT)  Indication: hip abscess Regimen: cefepime 2-3g post HD End date: 03/01/20  IV antibiotic discharge orders are pended. To discharging provider:  please sign these orders via discharge navigator,  Select New Orders & click on the button choice - Manage This Unsigned Work.     Thank you for allowing pharmacy to be a part of this patient's care.  Benetta Spar, PharmD, BCPS, BCCP Clinical Pharmacist  Please check AMION for all [place] phone numbers After 10:00 PM, call [place] [phone]

## 2020-02-21 NOTE — Progress Notes (Signed)
Patient discharged home with family, all belongings sent with patient. No complications noted at this time. Audie Clear, LPN

## 2020-02-21 NOTE — Discharge Instructions (Signed)
Inpatient Rehab Discharge Instructions  [name] Discharge date and time:  02/21/20  Activities/Precautions/ Functional Status: Activity: no lifting, driving, or strenuous exercise till cleared by MD Diet: diabetic diet and renal diet- Limit fluids to 1200 cc/day Wound Care: Wash incision with antibacterial soap and water. Pat dry--keep clean and dry. Contact Dr. Doran Durand  if you develop any problems with your incision/wound--redness, swelling, increase in pain, drainage or if you develop fever or chills. .    Functional status:  ___ No restrictions     ___ Walk up steps independently ___ 24/7 supervision/assistance   ___ Walk up steps with assistance _X__ Intermittent supervision/assistance  _X__ Bathe/dress independently ___ Walk with walker     ___ Bathe/dress with assistance ___ Walk Independently    ___ Shower independently ___ Walk with assistance    ___ Shower with assistance _X__ No alcohol     ___ Return to work/school ________    Special Instructions: 1. No showers till cleared by Renal service.  2. Monitor blood sugars before meals and at bedtime.  Take list with you to PCP visit. You may need to increase your Lantus by 5 units daily to home dose if blood sugars are running over 120 consistently.        COMMUNITY REFERRALS UPON DISCHARGE:    Home Health:   PT, OT                  [place] Equipment/Items Ordered:WHEELCHAIR, DROP-ARM BEDSIDE COMMODE AND 24 TRANSFER BOARD                                                  Agency/Supplier:[facility] [employer]  [place]   My questions have been answered and I understand these instructions. I will adhere to these goals and the provided educational materials after my discharge from the hospital.  Patient/Caregiver Signature _______________________________ Date __________  Clinician Signature  _______________________________________ Date __________  Please bring this form and your medication list with you to all your follow-up doctor's appointments.

## 2020-02-21 NOTE — Progress Notes (Signed)
Discussed rationale for CT abdomen/pelvis-->Dr. Juleen China would prefer to have it done prior to follow up next week to help determine course of care and would also save patient a visit to X ray after d/c.  Patient again declined it and prefers to do it on outpatient basis.   PDMP reviewed and note that patient was on oxycodone 10 mg tid PTA per [facility] clinic but unaware of any "pain contract". Reached out to [facility] [address] for clarification. Ok to Rx narcotics per Ms. Donovan Kail. Also contacted [facility] for follow up appt--patient apparently cancelled her appt for next week. This was rescheduled and patient educated on importance of follow up appt.

## 2020-02-22 ENCOUNTER — Telehealth: Payer: Self-pay | Admitting: Nephrology

## 2020-02-22 DIAGNOSIS — Z89512 Acquired absence of left leg below knee: Secondary | ICD-10-CM | POA: Diagnosis not present

## 2020-02-22 DIAGNOSIS — Z992 Dependence on renal dialysis: Secondary | ICD-10-CM | POA: Diagnosis not present

## 2020-02-22 DIAGNOSIS — D689 Coagulation defect, unspecified: Secondary | ICD-10-CM | POA: Diagnosis not present

## 2020-02-22 DIAGNOSIS — S72002A Fracture of unspecified part of neck of left femur, initial encounter for closed fracture: Secondary | ICD-10-CM | POA: Diagnosis not present

## 2020-02-22 DIAGNOSIS — S71002A Unspecified open wound, left hip, initial encounter: Secondary | ICD-10-CM | POA: Diagnosis not present

## 2020-02-22 DIAGNOSIS — N2581 Secondary hyperparathyroidism of renal origin: Secondary | ICD-10-CM | POA: Diagnosis not present

## 2020-02-22 DIAGNOSIS — I639 Cerebral infarction, unspecified: Secondary | ICD-10-CM | POA: Diagnosis not present

## 2020-02-22 DIAGNOSIS — Z111 Encounter for screening for respiratory tuberculosis: Secondary | ICD-10-CM | POA: Diagnosis not present

## 2020-02-22 DIAGNOSIS — N186 End stage renal disease: Secondary | ICD-10-CM | POA: Diagnosis not present

## 2020-02-22 DIAGNOSIS — M71052 Abscess of bursa, left hip: Secondary | ICD-10-CM | POA: Diagnosis not present

## 2020-02-22 NOTE — Progress Notes (Signed)
Recreational Therapy Discharge Summary Patient Details  Name: [name] MRN: [id number] Date of Birth: [birth year] Today's Date: 02/22/2020  Long term goals set: 1  Long term goals met: 1  Comments on progress toward goals: Pt made great progress during LOS and discharged home with daughter.  Pt is Mod I seated/w/c level for TR tasks if items/envioronment are set up for her.  TR sessions focused on relaxation strategies, activity analysis/modifications and safety.  Goal met.  Reasons goals not met: n/a  Reasons for discharge: discharge from hospital Patient/family agrees with progress made and goals achieved: Yes  Loray Akard 02/22/2020, 8:56 AM

## 2020-02-22 NOTE — Telephone Encounter (Signed)
Transition of care contact from inpatient facility  Date of Discharge: 02/20/20 Date of Contact:02/22/20 attempted  Method of contact: Phone  Attempted to contact patient to discuss transition of care from inpatient admission. Patient did not answer the phone. Message was left on the patient's voicemail with call back number [phone].

## 2020-02-23 NOTE — Telephone Encounter (Signed)
Transition of care contact from inpatient facility  Date of discharge: 02/21/20 Date of contact: 02/23/20 Method: Phone Spoke to: Patient's daughter   Patient contacted to discuss transition of care from recent inpatient hospitalization. Patient was admitted to [facility] from .01/22/20 to 02/21/20.Marland Kitchen with discharge diagnosis of  ESRD New start HD, Left hip Fracture. complicated with  bacteremia, abscess  L BKA   Medication changes were reviewed.including stop amlodipine ,hold other bp meds before HD to avoid hd   Patient will follow up with his/her outpatient HD unit on:  02/24/20 [facility]

## 2020-02-24 DIAGNOSIS — Z992 Dependence on renal dialysis: Secondary | ICD-10-CM | POA: Diagnosis not present

## 2020-02-24 DIAGNOSIS — M71052 Abscess of bursa, left hip: Secondary | ICD-10-CM | POA: Diagnosis not present

## 2020-02-24 DIAGNOSIS — N2581 Secondary hyperparathyroidism of renal origin: Secondary | ICD-10-CM | POA: Diagnosis not present

## 2020-02-24 DIAGNOSIS — D689 Coagulation defect, unspecified: Secondary | ICD-10-CM | POA: Diagnosis not present

## 2020-02-24 DIAGNOSIS — Z111 Encounter for screening for respiratory tuberculosis: Secondary | ICD-10-CM | POA: Diagnosis not present

## 2020-02-24 DIAGNOSIS — N186 End stage renal disease: Secondary | ICD-10-CM | POA: Diagnosis not present

## 2020-02-24 DIAGNOSIS — S71002A Unspecified open wound, left hip, initial encounter: Secondary | ICD-10-CM | POA: Diagnosis not present

## 2020-02-27 DIAGNOSIS — S71002A Unspecified open wound, left hip, initial encounter: Secondary | ICD-10-CM | POA: Diagnosis not present

## 2020-02-27 DIAGNOSIS — Z111 Encounter for screening for respiratory tuberculosis: Secondary | ICD-10-CM | POA: Diagnosis not present

## 2020-02-27 DIAGNOSIS — Z992 Dependence on renal dialysis: Secondary | ICD-10-CM | POA: Diagnosis not present

## 2020-02-27 DIAGNOSIS — N186 End stage renal disease: Secondary | ICD-10-CM | POA: Diagnosis not present

## 2020-02-27 DIAGNOSIS — M71052 Abscess of bursa, left hip: Secondary | ICD-10-CM | POA: Diagnosis not present

## 2020-02-27 DIAGNOSIS — D689 Coagulation defect, unspecified: Secondary | ICD-10-CM | POA: Diagnosis not present

## 2020-02-27 DIAGNOSIS — N2581 Secondary hyperparathyroidism of renal origin: Secondary | ICD-10-CM | POA: Diagnosis not present

## 2020-02-28 ENCOUNTER — Inpatient Hospital Stay: Payer: Medicaid Other | Admitting: Internal Medicine

## 2020-02-28 ENCOUNTER — Inpatient Hospital Stay: Payer: Medicaid Other | Admitting: Family

## 2020-02-29 DIAGNOSIS — N186 End stage renal disease: Secondary | ICD-10-CM | POA: Diagnosis not present

## 2020-02-29 DIAGNOSIS — S71002A Unspecified open wound, left hip, initial encounter: Secondary | ICD-10-CM | POA: Diagnosis not present

## 2020-02-29 DIAGNOSIS — Z992 Dependence on renal dialysis: Secondary | ICD-10-CM | POA: Diagnosis not present

## 2020-02-29 DIAGNOSIS — M71052 Abscess of bursa, left hip: Secondary | ICD-10-CM | POA: Diagnosis not present

## 2020-02-29 DIAGNOSIS — Z111 Encounter for screening for respiratory tuberculosis: Secondary | ICD-10-CM | POA: Diagnosis not present

## 2020-02-29 DIAGNOSIS — N2581 Secondary hyperparathyroidism of renal origin: Secondary | ICD-10-CM | POA: Diagnosis not present

## 2020-02-29 DIAGNOSIS — D689 Coagulation defect, unspecified: Secondary | ICD-10-CM | POA: Diagnosis not present

## 2020-03-02 DIAGNOSIS — E1122 Type 2 diabetes mellitus with diabetic chronic kidney disease: Secondary | ICD-10-CM | POA: Diagnosis not present

## 2020-03-02 DIAGNOSIS — N2581 Secondary hyperparathyroidism of renal origin: Secondary | ICD-10-CM | POA: Diagnosis not present

## 2020-03-02 DIAGNOSIS — M71052 Abscess of bursa, left hip: Secondary | ICD-10-CM | POA: Diagnosis not present

## 2020-03-02 DIAGNOSIS — R7881 Bacteremia: Secondary | ICD-10-CM | POA: Diagnosis not present

## 2020-03-02 DIAGNOSIS — S7292XA Unspecified fracture of left femur, initial encounter for closed fracture: Secondary | ICD-10-CM | POA: Diagnosis not present

## 2020-03-02 DIAGNOSIS — Z992 Dependence on renal dialysis: Secondary | ICD-10-CM | POA: Diagnosis not present

## 2020-03-02 DIAGNOSIS — S71002A Unspecified open wound, left hip, initial encounter: Secondary | ICD-10-CM | POA: Diagnosis not present

## 2020-03-02 DIAGNOSIS — N186 End stage renal disease: Secondary | ICD-10-CM | POA: Diagnosis not present

## 2020-03-02 DIAGNOSIS — D689 Coagulation defect, unspecified: Secondary | ICD-10-CM | POA: Diagnosis not present

## 2020-03-02 DIAGNOSIS — Z111 Encounter for screening for respiratory tuberculosis: Secondary | ICD-10-CM | POA: Diagnosis not present

## 2020-03-07 DIAGNOSIS — E1122 Type 2 diabetes mellitus with diabetic chronic kidney disease: Secondary | ICD-10-CM | POA: Diagnosis not present

## 2020-03-07 DIAGNOSIS — Z992 Dependence on renal dialysis: Secondary | ICD-10-CM | POA: Diagnosis not present

## 2020-03-07 DIAGNOSIS — D689 Coagulation defect, unspecified: Secondary | ICD-10-CM | POA: Diagnosis not present

## 2020-03-07 DIAGNOSIS — N186 End stage renal disease: Secondary | ICD-10-CM | POA: Diagnosis not present

## 2020-03-07 DIAGNOSIS — D509 Iron deficiency anemia, unspecified: Secondary | ICD-10-CM | POA: Diagnosis not present

## 2020-03-07 DIAGNOSIS — N2581 Secondary hyperparathyroidism of renal origin: Secondary | ICD-10-CM | POA: Diagnosis not present

## 2020-03-07 DIAGNOSIS — D631 Anemia in chronic kidney disease: Secondary | ICD-10-CM | POA: Diagnosis not present

## 2020-03-08 ENCOUNTER — Encounter: Payer: Medicaid Other | Attending: Registered Nurse | Admitting: Registered Nurse

## 2020-03-09 ENCOUNTER — Encounter: Payer: Self-pay | Admitting: Family

## 2020-03-09 ENCOUNTER — Ambulatory Visit (INDEPENDENT_AMBULATORY_CARE_PROVIDER_SITE_OTHER): Payer: Medicaid Other | Admitting: Family

## 2020-03-09 ENCOUNTER — Other Ambulatory Visit: Payer: Self-pay

## 2020-03-09 VITALS — BP 169/64 | HR 62 | Temp 98.6°F

## 2020-03-09 DIAGNOSIS — D631 Anemia in chronic kidney disease: Secondary | ICD-10-CM | POA: Diagnosis not present

## 2020-03-09 DIAGNOSIS — L02416 Cutaneous abscess of left lower limb: Secondary | ICD-10-CM | POA: Diagnosis not present

## 2020-03-09 DIAGNOSIS — T8141XD Infection following a procedure, superficial incisional surgical site, subsequent encounter: Secondary | ICD-10-CM

## 2020-03-09 DIAGNOSIS — N186 End stage renal disease: Secondary | ICD-10-CM | POA: Diagnosis not present

## 2020-03-09 DIAGNOSIS — E1122 Type 2 diabetes mellitus with diabetic chronic kidney disease: Secondary | ICD-10-CM | POA: Diagnosis not present

## 2020-03-09 DIAGNOSIS — D509 Iron deficiency anemia, unspecified: Secondary | ICD-10-CM | POA: Diagnosis not present

## 2020-03-09 DIAGNOSIS — Z992 Dependence on renal dialysis: Secondary | ICD-10-CM | POA: Diagnosis not present

## 2020-03-09 DIAGNOSIS — D689 Coagulation defect, unspecified: Secondary | ICD-10-CM | POA: Diagnosis not present

## 2020-03-09 DIAGNOSIS — T8141XA Infection following a procedure, superficial incisional surgical site, initial encounter: Secondary | ICD-10-CM | POA: Insufficient documentation

## 2020-03-09 DIAGNOSIS — N2581 Secondary hyperparathyroidism of renal origin: Secondary | ICD-10-CM | POA: Diagnosis not present

## 2020-03-09 NOTE — Assessment & Plan Note (Signed)
[name] is a 55 y/o female with left hip post surgical site infection following intramedullary nail placement for intertrochanteric fracture of the left femur following a fall in the setting of recent BKA for chronic osteomyelitis. She has completed 6 weeks of ertapenem for Enterobacter cloaceae and Bacteroides infection. Wound appears to be healed well with no clear evidence of infection. Will check inflammatory markers today and consider stopping antibiotics pending results. Continue with wound care per orthopedic recommendations. Plan for follow up pending lab work results.

## 2020-03-09 NOTE — Patient Instructions (Addendum)
Nice to see you.  We will check your inflammatory markers today to determine the need for any further antibiotic treatment.   Continue with basic wound care per orthopedics and follow up with them.   Please call [facility] if you decide to seek counseling at [phone]  Follow up with ID as needed pending lab work results.   Have a great day and stay safe!

## 2020-03-09 NOTE — Progress Notes (Signed)
Subjective:    Patient ID: [name], female    DOB: February 18, 1966, 55 y.o.   MRN: [id number]  Chief Complaint  Patient presents with  . Follow-up     HPI:  [name] is a 55 y.o. female who was recently admitted to the hospital following a fall resulting in a femur fracture requiring intramedullary fixation on 10/28 in the setting of new dialysis requirement and s/p below knee amputation secondary to osteomyelitis. Operative cultures were positive for Enterobacter cloacae and bacteroidies. Initial treatment plan was for cefepime and metronidazole with dialysis, however she was unable to tolerate the metronidazole and ultimately changed to ertapenem with dialysis with planned treatment course of 6 weeks with end date of 03/01/20. Here today for hospitalization and post-treatment follow up.   [name] has been receiving her ertapenem with dialysis with the last dose being received on Monday, 1/3. Continues to have left hip pain. Surgical wound on left hip has healed over with no current drainage. Denies any fevers or chills. Currently in a wheelchair.    Allergies  Allergen Reactions  . Trazodone Swelling  . Latex Rash  . Lidocaine Itching      Outpatient Medications Prior to Visit  Medication Sig Dispense Refill  . alosetron (LOTRONEX) 1 MG tablet Take 1 mg by mouth 2 (two) times daily.    Marland Kitchen amitriptyline (ELAVIL) 10 MG tablet Take 1 tablet (10 mg total) by mouth at bedtime. 30 tablet 0  . amLODipine (NORVASC) 5 MG tablet Take 1 tablet (5 mg total) by mouth daily. 30 tablet 0  . atorvastatin (LIPITOR) 40 MG tablet TAKE 1 TABLET (40 MG TOTAL) BY MOUTH DAILY AT 6 PM. 30 tablet 1  . carvedilol (COREG) 6.25 MG tablet Take 1 tablet (6.25 mg total) by mouth 2 (two) times daily. 60 tablet 0  . citalopram (CELEXA) 20 MG tablet Take 1 tablet (20 mg total) by mouth daily. 30 tablet 0  . cyclobenzaprine (FLEXERIL) 5 MG tablet Take 1 tablet (5 mg total) by mouth 3 (three) times  daily as needed for muscle spasms. 30 tablet 0  . DEXILANT 60 MG capsule Take 60 mg by mouth daily.  6  . DULoxetine (CYMBALTA) 30 MG capsule Take 1 capsule (30 mg total) by mouth daily. 30 capsule 3  . hydrALAZINE (APRESOLINE) 100 MG tablet Take 1 tablet (100 mg total) by mouth 3 (three) times daily. 90 tablet 0  . insulin glargine (LANTUS) 100 UNIT/ML injection Inject 0.12 mLs (12 Units total) into the skin at bedtime. 10 mL 11  . melatonin 300 MCG TABS Take 5 tablets (1,500 mcg total) by mouth at bedtime.  0  . Menthol-Methyl Salicylate (MUSCLE RUB) 10-15 % CREA Apply 1 application topically as needed for muscle pain.  0  . morphine (MS CONTIN) 15 MG 12 hr tablet Take 1 tablet (15 mg total) by mouth every 12 (twelve) hours. 14 tablet 0  . oxyCODONE (OXY IR/ROXICODONE) 5 MG immediate release tablet Take 1-2 tablets (5-10 mg total) by mouth every 6 (six) hours as needed for severe pain. 28 tablet 0  . sevelamer carbonate (RENVELA) 800 MG tablet Take 2 tablets (1,600 mg total) by mouth 3 (three) times daily with meals. 180 tablet 0  . traMADol (ULTRAM) 50 MG tablet Take 1 tablet (50 mg total) by mouth every 6 (six) hours as needed for moderate pain. 28 tablet 0  . Zolpidem Tartrate (AMBIEN PO)     . ATORVASTATIN CALCIUM PO     .  HYDRALAZINE HCL PO     . sodium chloride (OCEAN) 0.65 % SOLN nasal spray Place 1 spray into both nostrils as needed for congestion (nose irritation). (Patient not taking: Reported on 03/09/2020)  0   No facility-administered medications prior to visit.     Past Medical History:  Diagnosis Date  . Anxiety 2002  . Chest tightness   . Depression 2001  . Diabetes type 1, uncontrolled ([place])    at age 55  . Diabetic neuropathy ([place])   . Essential hypertension 2015  . GERD (gastroesophageal reflux disease)    about age of 59  . Nausea and vomiting in adult   . Stroke ([place])   . Urinary frequency   . Yeast vaginitis      Past Surgical History:  Procedure  Laterality Date  . ACHILLES TENDON SURGERY Left 03/10/2013   Procedure: LEFT CHOPART AMPUTATION/ LEFT TENDON ACHILLES [place];  Surgeon: Wylene Simmer, MD;  Location: [facility];  Service: Orthopedics;  Laterality: Left;  . AMPUTATION Left 06/15/2012   Procedure: AMPUTATION DIGIT;  Surgeon: Meredith Pel, MD;  Location: [facility];  Service: Orthopedics;  Laterality: Left;  Left great toe revision amputation  . AMPUTATION Left 01/12/2020   Procedure: AMPUTATION BELOW KNEE;  Surgeon: Wylene Simmer, MD;  Location: [facility];  Service: Orthopedics;  Laterality: Left;  . AV FISTULA PLACEMENT Left 01/09/2020   Procedure: LEFT UPPER ARM ARTERIOVENOUS (AV) FISTULA CREATION;  Surgeon: Cherre Robins, MD;  Location: [facility];  Service: Vascular;  Laterality: Left;  . ENDOMETRIAL ABLATION    . I & D EXTREMITY Left 01/19/2020   Procedure: IRRIGATION AND DEBRIDEMENT Left Hip;  Surgeon: Rod Can, MD;  Location: [facility];  Service: Orthopedics;  Laterality: Left;  . INSERTION OF DIALYSIS CATHETER Right 01/09/2020   Procedure: INSERTION OF DIALYSIS CATHETER;  Surgeon: Cherre Robins, MD;  Location: [facility];  Service: Vascular;  Laterality: Right;  . INTRAMEDULLARY (IM) NAIL INTERTROCHANTERIC Left 12/29/2019   Procedure: INTRAMEDULLARY (IM) NAIL INTERTROCHANTRIC;  Surgeon: Rod Can, MD;  [facility];  Service: Orthopedics;  Laterality: Left;  . IR ANGIOGRAM EXTREMITY LEFT  12/17/2018  . IR FEM POP ART ATHERECT INC PTA MOD SED  12/17/2018  . IR RADIOLOGIST EVAL & MGMT  11/30/2018  . IR RADIOLOGIST EVAL & MGMT  12/09/2018  . IR RADIOLOGIST EVAL & MGMT  02/08/2019  . IR US GUIDE VASC ACCESS RIGHT  12/17/2018  . LAPAROSCOPIC CHOLECYSTECTOMY  01/30/2015   [facility]        Review of Systems  Constitutional: Negative for chills, diaphoresis, fatigue and fever.  Respiratory: Negative for cough, chest tightness, shortness of breath and wheezing.   Cardiovascular: Negative for chest pain.   Gastrointestinal: Negative for abdominal pain, diarrhea, nausea and vomiting.  Musculoskeletal:       Positive for left hip pain.       Objective:    BP (!) 169/64   Pulse 62   Temp 98.6 F (37 C) (Oral)   SpO2 98%  Nursing note and vital signs reviewed.  Physical Exam Constitutional:      General: She is not in acute distress.    Appearance: She is well-developed and well-nourished.     Comments: Seated in the wheelchair; pleasant.   Cardiovascular:     Rate and Rhythm: Normal rate and regular rhythm.     Pulses: Intact distal pulses.     Heart sounds: Normal heart sounds.  Pulmonary:  Effort: Pulmonary effort is normal.     Breath sounds: Normal breath sounds.  Musculoskeletal:     Comments: Left hip surgical site appears with small dry scab located on posterior 1/3 of the site. No drainage, tenderness, or odor.   Skin:    General: Skin is warm and dry.  Neurological:     Mental Status: She is alert and oriented to person, place, and time.  Psychiatric:        Mood and Affect: Mood and affect normal.        Behavior: Behavior normal.        Thought Content: Thought content normal.        Judgment: Judgment normal.      Depression screen [facility] 2/9 03/09/2020 11/24/2018 06/26/2016 01/10/2016  Decreased Interest 3 0 2 3  Down, Depressed, Hopeless 3 3 2 3   PHQ - 2 Score 6 3 4 6   Altered sleeping 2 0 3 3  Tired, decreased energy 0 0 3 3  Change in appetite 0 1 2 3   Feeling bad or failure about yourself  0 1 2 2   Trouble concentrating 2 3 3 3   Moving slowly or fidgety/restless 1 0 1 3  Suicidal thoughts 0 0 0 0  PHQ-9 Score 11 8 18 23   Difficult doing work/chores Extremely dIfficult - - -  Some recent data might be hidden       Assessment & Plan:    Patient Active Problem List   Diagnosis Date Noted  . Infection of superficial incisional surgical site after procedure 03/09/2020  . Abscess   . Hypoglycemia   . Leukocytosis   . Essential hypertension   .  Bacteremia   . Sleep disturbance   . Postoperative pain   . Controlled type 1 diabetes mellitus with hyperglycemia ([place])   . ESRD on dialysis ([place]) 01/24/2020  . 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[place]) 01/23/2020  . BKA stump complication ([place])   . Hyponatremia   . Constipation   . Post-op pain   . Chronic osteomyelitis involving left ankle and foot ([place])   . Ulcer of left foot with necrosis of bone ([place])   . Fracture 12/28/2019  . AKI (acute kidney injury) ([place]) 12/28/2019  . Volume overload 12/28/2019  . Acute on chronic anemia 12/28/2019  . CKD (chronic kidney disease) stage 5, GFR less than 15 ml/min (HCC) 12/06/2019  . History of Chopart amputation of left foot ([place]) 12/25/2017  . Dyslipidemia, goal LDL below 100 07/04/2017  . History of CVA (cerebrovascular accident) without residual deficits 07/04/2017  . Cerebral thrombosis with cerebral infarction 04/08/2017  . Acute right-sided weakness 04/07/2017  . Slurred speech 04/07/2017  . Right sided weakness 04/07/2017  . Hyperhidrosis 09/01/2016  . Migraine with aura and without status migrainosus, not intractable 07/28/2016  . 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[place]) 08/04/2014  . CKD stage 3 due to type 2 diabetes mellitus ([place]) 06/27/2014  . Vitamin D insufficiency 05/08/2014  . Obesity (BMI 30.0-34.9) 05/08/2014  . Unilateral amputation of left foot ([place]) 05/08/2014  . Bursitis of left shoulder 02/14/2014  . Neck pain 01/03/2014  . Diabetes mellitus type 2, uncontrolled, with complications ([place]) 49/70/2637  . 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.23) 01/14/2013  . Insomnia 08/04/2012  . Diabetic neuropathy, painful ([place]) 08/04/2011  . Anxiety and depression 05/16/2010  . Essential hypertension, benign 11/01/2007  . Female stress incontinence 11/01/2007  . TOBACCO DEPENDENCE 04/30/2006  . GASTROESOPHAGEAL REFLUX,  NO ESOPHAGITIS 04/30/2006  . Irritable bowel  syndrome 04/30/2006     Problem List Items Addressed This Visit      Other   Infection of superficial incisional surgical site after procedure - Primary    [name] is a 55 y/o female with left hip post surgical site infection following intramedullary nail placement for intertrochanteric fracture of the left femur following a fall in the setting of recent BKA for chronic osteomyelitis. She has completed 6 weeks of ertapenem for Enterobacter cloaceae and Bacteroides infection. Wound appears to be healed well with no clear evidence of infection. Will check inflammatory markers today and consider stopping antibiotics pending results. Continue with wound care per orthopedic recommendations. Plan for follow up pending lab work results.       Relevant Orders   Sedimentation rate (Completed)   C-reactive protein       I am having [name] maintain her Dexilant, atorvastatin, alosetron, Muscle Rub, sodium chloride, amitriptyline, amLODipine, carvedilol, citalopram, DULoxetine, hydrALAZINE, Melatonin, insulin glargine, sevelamer carbonate, traMADol, morphine, cyclobenzaprine, oxyCODONE, Zolpidem Tartrate (AMBIEN PO), ATORVASTATIN CALCIUM PO, and HYDRALAZINE HCL PO.   Follow-up: Return if symptoms worsen or fail to improve.   Terri Piedra, MSN, FNP-C Nurse Practitioner [facility] [place] number: [phone]

## 2020-03-10 LAB — SEDIMENTATION RATE: Sed Rate: 29 mm/h (ref 0–30)

## 2020-03-10 LAB — C-REACTIVE PROTEIN: CRP: 25.8 mg/L — ABNORMAL HIGH (ref <8.0)

## 2020-03-12 ENCOUNTER — Telehealth: Payer: Self-pay

## 2020-03-12 DIAGNOSIS — N2581 Secondary hyperparathyroidism of renal origin: Secondary | ICD-10-CM | POA: Diagnosis not present

## 2020-03-12 DIAGNOSIS — N186 End stage renal disease: Secondary | ICD-10-CM | POA: Diagnosis not present

## 2020-03-12 DIAGNOSIS — D689 Coagulation defect, unspecified: Secondary | ICD-10-CM | POA: Diagnosis not present

## 2020-03-12 DIAGNOSIS — Z992 Dependence on renal dialysis: Secondary | ICD-10-CM | POA: Diagnosis not present

## 2020-03-12 DIAGNOSIS — D631 Anemia in chronic kidney disease: Secondary | ICD-10-CM | POA: Diagnosis not present

## 2020-03-12 DIAGNOSIS — E1122 Type 2 diabetes mellitus with diabetic chronic kidney disease: Secondary | ICD-10-CM | POA: Diagnosis not present

## 2020-03-12 DIAGNOSIS — D509 Iron deficiency anemia, unspecified: Secondary | ICD-10-CM | POA: Diagnosis not present

## 2020-03-12 NOTE — Telephone Encounter (Signed)
Spoke with patient to let her know that per Terri Piedra, NP her labs looks good and that he would like to see how she does off of the antibiotics for now. Scheduled patient for virtual visit 04/06/20. Patient verbalized understanding and has no further questions.     Beryle Flock, RN

## 2020-03-12 NOTE — Telephone Encounter (Signed)
-----   Message from [place], [place] sent at 03/12/2020 12:05 PM EST ----- Please inform [name] that her numbers look good and we will watch off antibiotics for now. Please schedule a virtual visit in a month.

## 2020-03-14 ENCOUNTER — Telehealth: Payer: Self-pay | Admitting: Physical Medicine and Rehabilitation

## 2020-03-14 ENCOUNTER — Other Ambulatory Visit: Payer: Self-pay | Admitting: Physical Medicine and Rehabilitation

## 2020-03-14 ENCOUNTER — Telehealth: Payer: Self-pay

## 2020-03-14 DIAGNOSIS — D689 Coagulation defect, unspecified: Secondary | ICD-10-CM | POA: Diagnosis not present

## 2020-03-14 DIAGNOSIS — N186 End stage renal disease: Secondary | ICD-10-CM | POA: Diagnosis not present

## 2020-03-14 DIAGNOSIS — N2581 Secondary hyperparathyroidism of renal origin: Secondary | ICD-10-CM | POA: Diagnosis not present

## 2020-03-14 DIAGNOSIS — E1122 Type 2 diabetes mellitus with diabetic chronic kidney disease: Secondary | ICD-10-CM | POA: Diagnosis not present

## 2020-03-14 DIAGNOSIS — D631 Anemia in chronic kidney disease: Secondary | ICD-10-CM | POA: Diagnosis not present

## 2020-03-14 DIAGNOSIS — Z992 Dependence on renal dialysis: Secondary | ICD-10-CM | POA: Diagnosis not present

## 2020-03-14 DIAGNOSIS — D509 Iron deficiency anemia, unspecified: Secondary | ICD-10-CM | POA: Diagnosis not present

## 2020-03-14 NOTE — Telephone Encounter (Signed)
She will need to get from PCP- I cannot legally fill meds esp 90 day supply of meds when pt hasn't been seen in hospital f/u- if she reschedules, we can write for them after that, but not until seen.  Thank you, ML

## 2020-03-14 NOTE — Telephone Encounter (Signed)
Copied from [place] [id number]. Topic: General - Other >> Mar 13, 2020 11:10 AM Rainey Pines A wrote: [facility] is requesting that paperwork that was faxed over be signed and faxed back today.   Please advise

## 2020-03-14 NOTE — Telephone Encounter (Signed)
Refill request for 90 day for Citalopram, Amitriptyline, Duloxetine. Pt no showed for 03/08/20 appt with Zella Ball.

## 2020-03-16 DIAGNOSIS — D509 Iron deficiency anemia, unspecified: Secondary | ICD-10-CM | POA: Diagnosis not present

## 2020-03-16 DIAGNOSIS — Z992 Dependence on renal dialysis: Secondary | ICD-10-CM | POA: Diagnosis not present

## 2020-03-16 DIAGNOSIS — N186 End stage renal disease: Secondary | ICD-10-CM | POA: Diagnosis not present

## 2020-03-16 DIAGNOSIS — E1122 Type 2 diabetes mellitus with diabetic chronic kidney disease: Secondary | ICD-10-CM | POA: Diagnosis not present

## 2020-03-16 DIAGNOSIS — N2581 Secondary hyperparathyroidism of renal origin: Secondary | ICD-10-CM | POA: Diagnosis not present

## 2020-03-16 DIAGNOSIS — D689 Coagulation defect, unspecified: Secondary | ICD-10-CM | POA: Diagnosis not present

## 2020-03-16 DIAGNOSIS — D631 Anemia in chronic kidney disease: Secondary | ICD-10-CM | POA: Diagnosis not present

## 2020-03-16 NOTE — Telephone Encounter (Signed)
Have not seen paperwork to fax back.

## 2020-03-21 DIAGNOSIS — D631 Anemia in chronic kidney disease: Secondary | ICD-10-CM | POA: Diagnosis not present

## 2020-03-21 DIAGNOSIS — Z992 Dependence on renal dialysis: Secondary | ICD-10-CM | POA: Diagnosis not present

## 2020-03-21 DIAGNOSIS — D509 Iron deficiency anemia, unspecified: Secondary | ICD-10-CM | POA: Diagnosis not present

## 2020-03-21 DIAGNOSIS — D689 Coagulation defect, unspecified: Secondary | ICD-10-CM | POA: Diagnosis not present

## 2020-03-21 DIAGNOSIS — N186 End stage renal disease: Secondary | ICD-10-CM | POA: Diagnosis not present

## 2020-03-21 DIAGNOSIS — E1122 Type 2 diabetes mellitus with diabetic chronic kidney disease: Secondary | ICD-10-CM | POA: Diagnosis not present

## 2020-03-21 DIAGNOSIS — N2581 Secondary hyperparathyroidism of renal origin: Secondary | ICD-10-CM | POA: Diagnosis not present

## 2020-03-22 ENCOUNTER — Other Ambulatory Visit: Payer: Self-pay | Admitting: Physical Medicine and Rehabilitation

## 2020-03-23 DIAGNOSIS — N186 End stage renal disease: Secondary | ICD-10-CM | POA: Diagnosis not present

## 2020-03-23 DIAGNOSIS — D689 Coagulation defect, unspecified: Secondary | ICD-10-CM | POA: Diagnosis not present

## 2020-03-23 DIAGNOSIS — Z992 Dependence on renal dialysis: Secondary | ICD-10-CM | POA: Diagnosis not present

## 2020-03-23 DIAGNOSIS — N2581 Secondary hyperparathyroidism of renal origin: Secondary | ICD-10-CM | POA: Diagnosis not present

## 2020-03-23 DIAGNOSIS — D631 Anemia in chronic kidney disease: Secondary | ICD-10-CM | POA: Diagnosis not present

## 2020-03-23 DIAGNOSIS — E1122 Type 2 diabetes mellitus with diabetic chronic kidney disease: Secondary | ICD-10-CM | POA: Diagnosis not present

## 2020-03-23 DIAGNOSIS — D509 Iron deficiency anemia, unspecified: Secondary | ICD-10-CM | POA: Diagnosis not present

## 2020-03-23 NOTE — Telephone Encounter (Signed)
Refill request for Amitriptyline and Amlodipine. Patient no show for HFU. Per Dr. Dagoberto Ligas call pharmacy and have them send the refill request to PCP. Pharmacy has been contacted.

## 2020-03-26 DIAGNOSIS — E1122 Type 2 diabetes mellitus with diabetic chronic kidney disease: Secondary | ICD-10-CM | POA: Diagnosis not present

## 2020-03-26 DIAGNOSIS — N2581 Secondary hyperparathyroidism of renal origin: Secondary | ICD-10-CM | POA: Diagnosis not present

## 2020-03-26 DIAGNOSIS — N186 End stage renal disease: Secondary | ICD-10-CM | POA: Diagnosis not present

## 2020-03-26 DIAGNOSIS — D631 Anemia in chronic kidney disease: Secondary | ICD-10-CM | POA: Diagnosis not present

## 2020-03-26 DIAGNOSIS — D509 Iron deficiency anemia, unspecified: Secondary | ICD-10-CM | POA: Diagnosis not present

## 2020-03-26 DIAGNOSIS — D689 Coagulation defect, unspecified: Secondary | ICD-10-CM | POA: Diagnosis not present

## 2020-03-26 DIAGNOSIS — Z992 Dependence on renal dialysis: Secondary | ICD-10-CM | POA: Diagnosis not present

## 2020-03-27 ENCOUNTER — Telehealth: Payer: Self-pay | Admitting: Physician Assistant

## 2020-03-27 NOTE — Telephone Encounter (Signed)
I attempted to reach Ms.Kopas today to get her scheduled for a phone visit with the RN Case Manager on the Managed Medicaid team. I left my name and number for her to call me back. I will reach out again in the next 7-14 days if I have not heard back from her.

## 2020-03-28 DIAGNOSIS — D689 Coagulation defect, unspecified: Secondary | ICD-10-CM | POA: Diagnosis not present

## 2020-03-28 DIAGNOSIS — D509 Iron deficiency anemia, unspecified: Secondary | ICD-10-CM | POA: Diagnosis not present

## 2020-03-28 DIAGNOSIS — N186 End stage renal disease: Secondary | ICD-10-CM | POA: Diagnosis not present

## 2020-03-28 DIAGNOSIS — Z992 Dependence on renal dialysis: Secondary | ICD-10-CM | POA: Diagnosis not present

## 2020-03-28 DIAGNOSIS — N2581 Secondary hyperparathyroidism of renal origin: Secondary | ICD-10-CM | POA: Diagnosis not present

## 2020-03-28 DIAGNOSIS — E1122 Type 2 diabetes mellitus with diabetic chronic kidney disease: Secondary | ICD-10-CM | POA: Diagnosis not present

## 2020-03-28 DIAGNOSIS — D631 Anemia in chronic kidney disease: Secondary | ICD-10-CM | POA: Diagnosis not present

## 2020-03-30 DIAGNOSIS — N2581 Secondary hyperparathyroidism of renal origin: Secondary | ICD-10-CM | POA: Diagnosis not present

## 2020-03-30 DIAGNOSIS — D689 Coagulation defect, unspecified: Secondary | ICD-10-CM | POA: Diagnosis not present

## 2020-03-30 DIAGNOSIS — Z992 Dependence on renal dialysis: Secondary | ICD-10-CM | POA: Diagnosis not present

## 2020-03-30 DIAGNOSIS — D509 Iron deficiency anemia, unspecified: Secondary | ICD-10-CM | POA: Diagnosis not present

## 2020-03-30 DIAGNOSIS — E1122 Type 2 diabetes mellitus with diabetic chronic kidney disease: Secondary | ICD-10-CM | POA: Diagnosis not present

## 2020-03-30 DIAGNOSIS — N186 End stage renal disease: Secondary | ICD-10-CM | POA: Diagnosis not present

## 2020-03-30 DIAGNOSIS — D631 Anemia in chronic kidney disease: Secondary | ICD-10-CM | POA: Diagnosis not present

## 2020-04-02 DIAGNOSIS — D631 Anemia in chronic kidney disease: Secondary | ICD-10-CM | POA: Diagnosis not present

## 2020-04-02 DIAGNOSIS — E1122 Type 2 diabetes mellitus with diabetic chronic kidney disease: Secondary | ICD-10-CM | POA: Diagnosis not present

## 2020-04-02 DIAGNOSIS — D509 Iron deficiency anemia, unspecified: Secondary | ICD-10-CM | POA: Diagnosis not present

## 2020-04-02 DIAGNOSIS — Z992 Dependence on renal dialysis: Secondary | ICD-10-CM | POA: Diagnosis not present

## 2020-04-02 DIAGNOSIS — N186 End stage renal disease: Secondary | ICD-10-CM | POA: Diagnosis not present

## 2020-04-02 DIAGNOSIS — N2581 Secondary hyperparathyroidism of renal origin: Secondary | ICD-10-CM | POA: Diagnosis not present

## 2020-04-02 DIAGNOSIS — D689 Coagulation defect, unspecified: Secondary | ICD-10-CM | POA: Diagnosis not present

## 2020-04-04 DIAGNOSIS — D509 Iron deficiency anemia, unspecified: Secondary | ICD-10-CM | POA: Diagnosis not present

## 2020-04-04 DIAGNOSIS — N2581 Secondary hyperparathyroidism of renal origin: Secondary | ICD-10-CM | POA: Diagnosis not present

## 2020-04-04 DIAGNOSIS — Z992 Dependence on renal dialysis: Secondary | ICD-10-CM | POA: Diagnosis not present

## 2020-04-04 DIAGNOSIS — N186 End stage renal disease: Secondary | ICD-10-CM | POA: Diagnosis not present

## 2020-04-04 DIAGNOSIS — D631 Anemia in chronic kidney disease: Secondary | ICD-10-CM | POA: Diagnosis not present

## 2020-04-04 DIAGNOSIS — E1122 Type 2 diabetes mellitus with diabetic chronic kidney disease: Secondary | ICD-10-CM | POA: Diagnosis not present

## 2020-04-04 DIAGNOSIS — D689 Coagulation defect, unspecified: Secondary | ICD-10-CM | POA: Diagnosis not present

## 2020-04-06 ENCOUNTER — Encounter (INDEPENDENT_AMBULATORY_CARE_PROVIDER_SITE_OTHER): Payer: Medicaid Other | Admitting: Family

## 2020-04-06 ENCOUNTER — Other Ambulatory Visit: Payer: Self-pay

## 2020-04-06 ENCOUNTER — Other Ambulatory Visit: Payer: Self-pay | Admitting: Physical Medicine and Rehabilitation

## 2020-04-06 DIAGNOSIS — N186 End stage renal disease: Secondary | ICD-10-CM | POA: Diagnosis not present

## 2020-04-06 DIAGNOSIS — D509 Iron deficiency anemia, unspecified: Secondary | ICD-10-CM | POA: Diagnosis not present

## 2020-04-06 DIAGNOSIS — D631 Anemia in chronic kidney disease: Secondary | ICD-10-CM | POA: Diagnosis not present

## 2020-04-06 DIAGNOSIS — Z992 Dependence on renal dialysis: Secondary | ICD-10-CM | POA: Diagnosis not present

## 2020-04-06 DIAGNOSIS — E1122 Type 2 diabetes mellitus with diabetic chronic kidney disease: Secondary | ICD-10-CM | POA: Diagnosis not present

## 2020-04-06 DIAGNOSIS — D689 Coagulation defect, unspecified: Secondary | ICD-10-CM | POA: Diagnosis not present

## 2020-04-06 DIAGNOSIS — N2581 Secondary hyperparathyroidism of renal origin: Secondary | ICD-10-CM | POA: Diagnosis not present

## 2020-04-06 NOTE — Progress Notes (Signed)
Error

## 2020-04-08 ENCOUNTER — Other Ambulatory Visit: Payer: Self-pay | Admitting: Physical Medicine and Rehabilitation

## 2020-04-09 DIAGNOSIS — Z992 Dependence on renal dialysis: Secondary | ICD-10-CM | POA: Diagnosis not present

## 2020-04-09 DIAGNOSIS — D631 Anemia in chronic kidney disease: Secondary | ICD-10-CM | POA: Diagnosis not present

## 2020-04-09 DIAGNOSIS — N2581 Secondary hyperparathyroidism of renal origin: Secondary | ICD-10-CM | POA: Diagnosis not present

## 2020-04-09 DIAGNOSIS — D509 Iron deficiency anemia, unspecified: Secondary | ICD-10-CM | POA: Diagnosis not present

## 2020-04-09 DIAGNOSIS — D689 Coagulation defect, unspecified: Secondary | ICD-10-CM | POA: Diagnosis not present

## 2020-04-09 DIAGNOSIS — E1122 Type 2 diabetes mellitus with diabetic chronic kidney disease: Secondary | ICD-10-CM | POA: Diagnosis not present

## 2020-04-09 DIAGNOSIS — N186 End stage renal disease: Secondary | ICD-10-CM | POA: Diagnosis not present

## 2020-04-13 DIAGNOSIS — E1122 Type 2 diabetes mellitus with diabetic chronic kidney disease: Secondary | ICD-10-CM | POA: Diagnosis not present

## 2020-04-13 DIAGNOSIS — D509 Iron deficiency anemia, unspecified: Secondary | ICD-10-CM | POA: Diagnosis not present

## 2020-04-13 DIAGNOSIS — N2581 Secondary hyperparathyroidism of renal origin: Secondary | ICD-10-CM | POA: Diagnosis not present

## 2020-04-13 DIAGNOSIS — D631 Anemia in chronic kidney disease: Secondary | ICD-10-CM | POA: Diagnosis not present

## 2020-04-13 DIAGNOSIS — N186 End stage renal disease: Secondary | ICD-10-CM | POA: Diagnosis not present

## 2020-04-13 DIAGNOSIS — D689 Coagulation defect, unspecified: Secondary | ICD-10-CM | POA: Diagnosis not present

## 2020-04-13 DIAGNOSIS — Z992 Dependence on renal dialysis: Secondary | ICD-10-CM | POA: Diagnosis not present

## 2020-04-16 DIAGNOSIS — D631 Anemia in chronic kidney disease: Secondary | ICD-10-CM | POA: Diagnosis not present

## 2020-04-16 DIAGNOSIS — N186 End stage renal disease: Secondary | ICD-10-CM | POA: Diagnosis not present

## 2020-04-16 DIAGNOSIS — N2581 Secondary hyperparathyroidism of renal origin: Secondary | ICD-10-CM | POA: Diagnosis not present

## 2020-04-16 DIAGNOSIS — D509 Iron deficiency anemia, unspecified: Secondary | ICD-10-CM | POA: Diagnosis not present

## 2020-04-16 DIAGNOSIS — Z992 Dependence on renal dialysis: Secondary | ICD-10-CM | POA: Diagnosis not present

## 2020-04-16 DIAGNOSIS — E1122 Type 2 diabetes mellitus with diabetic chronic kidney disease: Secondary | ICD-10-CM | POA: Diagnosis not present

## 2020-04-16 DIAGNOSIS — D689 Coagulation defect, unspecified: Secondary | ICD-10-CM | POA: Diagnosis not present

## 2020-04-18 DIAGNOSIS — E1122 Type 2 diabetes mellitus with diabetic chronic kidney disease: Secondary | ICD-10-CM | POA: Diagnosis not present

## 2020-04-18 DIAGNOSIS — D509 Iron deficiency anemia, unspecified: Secondary | ICD-10-CM | POA: Diagnosis not present

## 2020-04-18 DIAGNOSIS — D631 Anemia in chronic kidney disease: Secondary | ICD-10-CM | POA: Diagnosis not present

## 2020-04-18 DIAGNOSIS — Z992 Dependence on renal dialysis: Secondary | ICD-10-CM | POA: Diagnosis not present

## 2020-04-18 DIAGNOSIS — N186 End stage renal disease: Secondary | ICD-10-CM | POA: Diagnosis not present

## 2020-04-18 DIAGNOSIS — N2581 Secondary hyperparathyroidism of renal origin: Secondary | ICD-10-CM | POA: Diagnosis not present

## 2020-04-18 DIAGNOSIS — D689 Coagulation defect, unspecified: Secondary | ICD-10-CM | POA: Diagnosis not present

## 2020-04-20 DIAGNOSIS — N186 End stage renal disease: Secondary | ICD-10-CM | POA: Diagnosis not present

## 2020-04-20 DIAGNOSIS — E1122 Type 2 diabetes mellitus with diabetic chronic kidney disease: Secondary | ICD-10-CM | POA: Diagnosis not present

## 2020-04-20 DIAGNOSIS — D631 Anemia in chronic kidney disease: Secondary | ICD-10-CM | POA: Diagnosis not present

## 2020-04-20 DIAGNOSIS — N2581 Secondary hyperparathyroidism of renal origin: Secondary | ICD-10-CM | POA: Diagnosis not present

## 2020-04-20 DIAGNOSIS — Z992 Dependence on renal dialysis: Secondary | ICD-10-CM | POA: Diagnosis not present

## 2020-04-20 DIAGNOSIS — D689 Coagulation defect, unspecified: Secondary | ICD-10-CM | POA: Diagnosis not present

## 2020-04-20 DIAGNOSIS — D509 Iron deficiency anemia, unspecified: Secondary | ICD-10-CM | POA: Diagnosis not present

## 2020-04-23 DIAGNOSIS — D689 Coagulation defect, unspecified: Secondary | ICD-10-CM | POA: Diagnosis not present

## 2020-04-23 DIAGNOSIS — D509 Iron deficiency anemia, unspecified: Secondary | ICD-10-CM | POA: Diagnosis not present

## 2020-04-23 DIAGNOSIS — E1122 Type 2 diabetes mellitus with diabetic chronic kidney disease: Secondary | ICD-10-CM | POA: Diagnosis not present

## 2020-04-23 DIAGNOSIS — Z992 Dependence on renal dialysis: Secondary | ICD-10-CM | POA: Diagnosis not present

## 2020-04-23 DIAGNOSIS — N2581 Secondary hyperparathyroidism of renal origin: Secondary | ICD-10-CM | POA: Diagnosis not present

## 2020-04-23 DIAGNOSIS — N186 End stage renal disease: Secondary | ICD-10-CM | POA: Diagnosis not present

## 2020-04-23 DIAGNOSIS — D631 Anemia in chronic kidney disease: Secondary | ICD-10-CM | POA: Diagnosis not present

## 2020-04-25 DIAGNOSIS — D509 Iron deficiency anemia, unspecified: Secondary | ICD-10-CM | POA: Diagnosis not present

## 2020-04-25 DIAGNOSIS — Z992 Dependence on renal dialysis: Secondary | ICD-10-CM | POA: Diagnosis not present

## 2020-04-25 DIAGNOSIS — E1122 Type 2 diabetes mellitus with diabetic chronic kidney disease: Secondary | ICD-10-CM | POA: Diagnosis not present

## 2020-04-25 DIAGNOSIS — N2581 Secondary hyperparathyroidism of renal origin: Secondary | ICD-10-CM | POA: Diagnosis not present

## 2020-04-25 DIAGNOSIS — N186 End stage renal disease: Secondary | ICD-10-CM | POA: Diagnosis not present

## 2020-04-25 DIAGNOSIS — D689 Coagulation defect, unspecified: Secondary | ICD-10-CM | POA: Diagnosis not present

## 2020-04-25 DIAGNOSIS — D631 Anemia in chronic kidney disease: Secondary | ICD-10-CM | POA: Diagnosis not present

## 2020-04-27 ENCOUNTER — Other Ambulatory Visit: Payer: Self-pay | Admitting: Vascular Surgery

## 2020-04-27 DIAGNOSIS — D689 Coagulation defect, unspecified: Secondary | ICD-10-CM | POA: Diagnosis not present

## 2020-04-27 DIAGNOSIS — Z992 Dependence on renal dialysis: Secondary | ICD-10-CM | POA: Diagnosis not present

## 2020-04-27 DIAGNOSIS — N2581 Secondary hyperparathyroidism of renal origin: Secondary | ICD-10-CM | POA: Diagnosis not present

## 2020-04-27 DIAGNOSIS — E1122 Type 2 diabetes mellitus with diabetic chronic kidney disease: Secondary | ICD-10-CM | POA: Diagnosis not present

## 2020-04-27 DIAGNOSIS — N185 Chronic kidney disease, stage 5: Secondary | ICD-10-CM

## 2020-04-27 DIAGNOSIS — D631 Anemia in chronic kidney disease: Secondary | ICD-10-CM | POA: Diagnosis not present

## 2020-04-27 DIAGNOSIS — D509 Iron deficiency anemia, unspecified: Secondary | ICD-10-CM | POA: Diagnosis not present

## 2020-04-27 DIAGNOSIS — N186 End stage renal disease: Secondary | ICD-10-CM | POA: Diagnosis not present

## 2020-04-30 DIAGNOSIS — N2581 Secondary hyperparathyroidism of renal origin: Secondary | ICD-10-CM | POA: Diagnosis not present

## 2020-04-30 DIAGNOSIS — D631 Anemia in chronic kidney disease: Secondary | ICD-10-CM | POA: Diagnosis not present

## 2020-04-30 DIAGNOSIS — N186 End stage renal disease: Secondary | ICD-10-CM | POA: Diagnosis not present

## 2020-04-30 DIAGNOSIS — D689 Coagulation defect, unspecified: Secondary | ICD-10-CM | POA: Diagnosis not present

## 2020-04-30 DIAGNOSIS — D509 Iron deficiency anemia, unspecified: Secondary | ICD-10-CM | POA: Diagnosis not present

## 2020-04-30 DIAGNOSIS — E1122 Type 2 diabetes mellitus with diabetic chronic kidney disease: Secondary | ICD-10-CM | POA: Diagnosis not present

## 2020-04-30 DIAGNOSIS — Z992 Dependence on renal dialysis: Secondary | ICD-10-CM | POA: Diagnosis not present

## 2020-05-02 DIAGNOSIS — D689 Coagulation defect, unspecified: Secondary | ICD-10-CM | POA: Diagnosis not present

## 2020-05-02 DIAGNOSIS — N2581 Secondary hyperparathyroidism of renal origin: Secondary | ICD-10-CM | POA: Diagnosis not present

## 2020-05-02 DIAGNOSIS — Z992 Dependence on renal dialysis: Secondary | ICD-10-CM | POA: Diagnosis not present

## 2020-05-02 DIAGNOSIS — N186 End stage renal disease: Secondary | ICD-10-CM | POA: Diagnosis not present

## 2020-05-02 DIAGNOSIS — D509 Iron deficiency anemia, unspecified: Secondary | ICD-10-CM | POA: Diagnosis not present

## 2020-05-04 DIAGNOSIS — N2581 Secondary hyperparathyroidism of renal origin: Secondary | ICD-10-CM | POA: Diagnosis not present

## 2020-05-04 DIAGNOSIS — Z992 Dependence on renal dialysis: Secondary | ICD-10-CM | POA: Diagnosis not present

## 2020-05-04 DIAGNOSIS — D509 Iron deficiency anemia, unspecified: Secondary | ICD-10-CM | POA: Diagnosis not present

## 2020-05-04 DIAGNOSIS — D689 Coagulation defect, unspecified: Secondary | ICD-10-CM | POA: Diagnosis not present

## 2020-05-04 DIAGNOSIS — N186 End stage renal disease: Secondary | ICD-10-CM | POA: Diagnosis not present

## 2020-05-07 DIAGNOSIS — Z992 Dependence on renal dialysis: Secondary | ICD-10-CM | POA: Diagnosis not present

## 2020-05-07 DIAGNOSIS — N2581 Secondary hyperparathyroidism of renal origin: Secondary | ICD-10-CM | POA: Diagnosis not present

## 2020-05-07 DIAGNOSIS — D689 Coagulation defect, unspecified: Secondary | ICD-10-CM | POA: Diagnosis not present

## 2020-05-07 DIAGNOSIS — N186 End stage renal disease: Secondary | ICD-10-CM | POA: Diagnosis not present

## 2020-05-07 DIAGNOSIS — D509 Iron deficiency anemia, unspecified: Secondary | ICD-10-CM | POA: Diagnosis not present

## 2020-05-11 DIAGNOSIS — D509 Iron deficiency anemia, unspecified: Secondary | ICD-10-CM | POA: Diagnosis not present

## 2020-05-11 DIAGNOSIS — D689 Coagulation defect, unspecified: Secondary | ICD-10-CM | POA: Diagnosis not present

## 2020-05-11 DIAGNOSIS — Z992 Dependence on renal dialysis: Secondary | ICD-10-CM | POA: Diagnosis not present

## 2020-05-11 DIAGNOSIS — N186 End stage renal disease: Secondary | ICD-10-CM | POA: Diagnosis not present

## 2020-05-11 DIAGNOSIS — N2581 Secondary hyperparathyroidism of renal origin: Secondary | ICD-10-CM | POA: Diagnosis not present

## 2020-05-16 DIAGNOSIS — I10 Essential (primary) hypertension: Secondary | ICD-10-CM | POA: Diagnosis not present

## 2020-05-16 DIAGNOSIS — Z1159 Encounter for screening for other viral diseases: Secondary | ICD-10-CM | POA: Diagnosis not present

## 2020-05-16 DIAGNOSIS — M129 Arthropathy, unspecified: Secondary | ICD-10-CM | POA: Diagnosis not present

## 2020-05-23 DIAGNOSIS — Z89512 Acquired absence of left leg below knee: Secondary | ICD-10-CM | POA: Diagnosis not present

## 2020-05-25 ENCOUNTER — Telehealth: Payer: Self-pay | Admitting: Physician Assistant

## 2020-05-25 NOTE — Telephone Encounter (Signed)
2nd attempt to reach [name] to get her scheduled for a phone visit with the Managed Medicaid Team. I left my contact info on her VM.

## 2020-06-06 ENCOUNTER — Other Ambulatory Visit: Payer: Self-pay | Admitting: Physical Medicine and Rehabilitation

## 2020-06-14 ENCOUNTER — Telehealth: Payer: Self-pay | Admitting: Physician Assistant

## 2020-06-14 NOTE — Telephone Encounter (Signed)
Today was my 3rd attempt to reach [name] to get her scheduled with the Managed Medicaid Team. I left my contact info on her VM.

## 2020-06-30 ENCOUNTER — Other Ambulatory Visit: Payer: Self-pay

## 2020-06-30 DIAGNOSIS — N185 Chronic kidney disease, stage 5: Secondary | ICD-10-CM

## 2020-07-10 ENCOUNTER — Ambulatory Visit: Payer: Medicare Other

## 2020-07-10 ENCOUNTER — Ambulatory Visit (HOSPITAL_COMMUNITY): Payer: Medicare Other

## 2020-08-02 ENCOUNTER — Other Ambulatory Visit: Payer: Self-pay

## 2020-08-02 ENCOUNTER — Ambulatory Visit (INDEPENDENT_AMBULATORY_CARE_PROVIDER_SITE_OTHER): Payer: Medicare Other | Admitting: Physician Assistant

## 2020-08-02 ENCOUNTER — Ambulatory Visit (HOSPITAL_COMMUNITY)
Admission: RE | Admit: 2020-08-02 | Discharge: 2020-08-02 | Disposition: A | Discharge status: Home / Self Care | Payer: Medicare Other | Source: Ambulatory Visit | Attending: Vascular Surgery | Admitting: Vascular Surgery

## 2020-08-02 VITALS — BP 150/57 | HR 73 | Temp 98.1°F | Resp 20 | Ht 70.0 in | Wt 172.0 lb

## 2020-08-02 DIAGNOSIS — F17200 Nicotine dependence, unspecified, uncomplicated: Secondary | ICD-10-CM

## 2020-08-02 DIAGNOSIS — R0989 Other specified symptoms and signs involving the circulatory and respiratory systems: Secondary | ICD-10-CM | POA: Diagnosis not present

## 2020-08-02 DIAGNOSIS — N186 End stage renal disease: Secondary | ICD-10-CM | POA: Diagnosis not present

## 2020-08-02 DIAGNOSIS — Z992 Dependence on renal dialysis: Secondary | ICD-10-CM

## 2020-08-02 DIAGNOSIS — N185 Chronic kidney disease, stage 5: Secondary | ICD-10-CM | POA: Diagnosis present

## 2020-08-02 NOTE — Progress Notes (Signed)
HISTORY AND PHYSICAL     CC:  dialysis access Requesting Provider:  Julian Hy, PA*  HPI: This is a 56 y.o. female here for evaluation of her hemodialysis access.  Pt has hx of left BC AVF and TDC placement on 01/09/2020 by Dr. Stanford Breed.   She denies any left hand pain or numbness.  She is sent to be evaluated due to her fistula being difficult to palpate.   She has had hx of left BKA by Dr. Doran Durand.  She has not started using her prosthesis yet.    The pt is right hand dominant.    Pt is on dialysis.   Days of dialysis if applicable:  M/W/F    HD center if applicable:  [facility] location.   The pt is on a statin for cholesterol management.  The pt is not on a daily aspirin.  Other AC:  none The pt is on BB, CCB for hypertension.  The pt is diabetic.   Tobacco hx:  current  Past Medical History:  Diagnosis Date  . Anxiety 2002  . Chest tightness   . Depression 2001  . Diabetes type 1, uncontrolled ([place])    at age 60  . Diabetic neuropathy ([place])   . Essential hypertension 2015  . GERD (gastroesophageal reflux disease)    about age of 77  . Nausea and vomiting in adult   . Stroke ([place])   . Urinary frequency   . Yeast vaginitis     Past Surgical History:  Procedure Laterality Date  . ACHILLES TENDON SURGERY Left 03/10/2013   Procedure: LEFT CHOPART AMPUTATION/ LEFT TENDON ACHILLES [place];  Surgeon: Wylene Simmer, MD;  Location: [place];  Service: Orthopedics;  Laterality: Left;  . AMPUTATION Left 06/15/2012   Procedure: AMPUTATION DIGIT;  Surgeon: Meredith Pel, MD;  Location: [place];  Service: Orthopedics;  Laterality: Left;  Left great toe revision amputation  . AMPUTATION Left 01/12/2020   Procedure: AMPUTATION BELOW KNEE;  Surgeon: Wylene Simmer, MD;  Location: [place];  Service: Orthopedics;  Laterality: Left;  . AV FISTULA PLACEMENT Left 01/09/2020   Procedure: LEFT UPPER ARM ARTERIOVENOUS (AV) FISTULA CREATION;  Surgeon: Cherre Robins, MD;  Location: [place];   Service: Vascular;  Laterality: Left;  . ENDOMETRIAL ABLATION    . I & D EXTREMITY Left 01/19/2020   Procedure: IRRIGATION AND DEBRIDEMENT Left Hip;  Surgeon: Rod Can, MD;  Location: [place];  Service: Orthopedics;  Laterality: Left;  . INSERTION OF DIALYSIS CATHETER Right 01/09/2020   Procedure: INSERTION OF DIALYSIS CATHETER;  Surgeon: Cherre Robins, MD;  Location: [facility] [place];  Service: Vascular;  Laterality: Right;  . INTRAMEDULLARY (IM) NAIL INTERTROCHANTERIC Left 12/29/2019   Procedure: INTRAMEDULLARY (IM) NAIL INTERTROCHANTRIC;  Surgeon: Rod Can, MD;  [facility];  Service: Orthopedics;  Laterality: Left;  . IR ANGIOGRAM EXTREMITY LEFT  12/17/2018  . IR FEM POP ART ATHERECT INC PTA MOD SED  12/17/2018  . IR RADIOLOGIST EVAL & MGMT  11/30/2018  . IR RADIOLOGIST EVAL & MGMT  12/09/2018  . IR RADIOLOGIST EVAL & MGMT  02/08/2019  . IR US GUIDE VASC ACCESS RIGHT  12/17/2018  . LAPAROSCOPIC CHOLECYSTECTOMY  01/30/2015   [facility] day surgery     Allergies  Allergen Reactions  . Trazodone Swelling  . Latex Rash  . Lidocaine Itching    Current Outpatient Medications  Medication Sig Dispense Refill  . alosetron (LOTRONEX) 1 MG tablet Take 1 mg by mouth 2 (two) times  daily.    . amitriptyline (ELAVIL) 10 MG tablet Take 1 tablet (10 mg total) by mouth at bedtime. 30 tablet 0  . amLODipine (NORVASC) 5 MG tablet Take 1 tablet (5 mg total) by mouth daily. 30 tablet 0  . atorvastatin (LIPITOR) 40 MG tablet TAKE 1 TABLET (40 MG TOTAL) BY MOUTH DAILY AT 6 PM. 30 tablet 1  . ATORVASTATIN CALCIUM PO     . carvedilol (COREG) 6.25 MG tablet Take 1 tablet (6.25 mg total) by mouth 2 (two) times daily. 60 tablet 0  . citalopram (CELEXA) 20 MG tablet Take 1 tablet (20 mg total) by mouth daily. 30 tablet 0  . cyclobenzaprine (FLEXERIL) 5 MG tablet Take 1 tablet (5 mg total) by mouth 3 (three) times daily as needed for muscle spasms. 30 tablet 0  . DEXILANT 60 MG capsule Take 60 mg by mouth  daily.  6  . DULoxetine (CYMBALTA) 30 MG capsule Take 1 capsule (30 mg total) by mouth daily. 30 capsule 3  . hydrALAZINE (APRESOLINE) 100 MG tablet Take 1 tablet (100 mg total) by mouth 3 (three) times daily. 90 tablet 0  . HYDRALAZINE HCL PO     . insulin glargine (LANTUS) 100 UNIT/ML injection Inject 0.12 mLs (12 Units total) into the skin at bedtime. 10 mL 11  . melatonin 300 MCG TABS Take 5 tablets (1,500 mcg total) by mouth at bedtime.  0  . Menthol-Methyl Salicylate (MUSCLE RUB) 10-15 % CREA Apply 1 application topically as needed for muscle pain.  0  . morphine (MS CONTIN) 15 MG 12 hr tablet Take 1 tablet (15 mg total) by mouth every 12 (twelve) hours. 14 tablet 0  . oxyCODONE (OXY IR/ROXICODONE) 5 MG immediate release tablet Take 1-2 tablets (5-10 mg total) by mouth every 6 (six) hours as needed for severe pain. 28 tablet 0  . sevelamer carbonate (RENVELA) 800 MG tablet Take 2 tablets (1,600 mg total) by mouth 3 (three) times daily with meals. 180 tablet 0  . sodium chloride (OCEAN) 0.65 % SOLN nasal spray Place 1 spray into both nostrils as needed for congestion (nose irritation). (Patient not taking: Reported on 03/09/2020)  0  . traMADol (ULTRAM) 50 MG tablet Take 1 tablet (50 mg total) by mouth every 6 (six) hours as needed for moderate pain. 28 tablet 0  . Zolpidem Tartrate (AMBIEN PO)      No current facility-administered medications for this visit.    Family History  Problem Relation Age of Onset  . Diabetes Mother   . Heart attack Mother   . Heart disease Mother        before age 26  . Hypertension Mother   . Hyperlipidemia Mother   . Diabetes Father   . Heart attack Father   . Heart disease Father        before age 60  . Diabetes Sister   . Hypertension Sister     Social History   Socioeconomic History  . Marital status: Divorced    Spouse name: Not on file  . Number of children: 1  . Years of education: Not on file  . Highest education level: Not on file   Occupational History    Employer: [employer]  Tobacco Use  . Smoking status: Light Tobacco Smoker    Packs/day: 0.25    Years: 22.00    Pack years: 5.50    Types: Cigarettes  . Smokeless tobacco: Never Used  Vaping Use  . Vaping Use: Never  used  Substance and Sexual Activity  . Alcohol use: Yes    Alcohol/week: 0.0 standard drinks    Comment: sociial  . Drug use: No  . Sexual activity: Yes    Partners: Male  Other Topics Concern  . Not on file  Social History Narrative   Lives at home with her daughter   Right handed   Caffeine: 3 cups daily   Social Determinants of Health   Financial Resource Strain: Not on file  Food Insecurity: Not on file  Transportation Needs: Not on file  Physical Activity: Not on file  Stress: Not on file  Social Connections: Not on file  Intimate Partner Violence: Not on file     ROS: '[x]'$  Positive   '[ ]'$  Negative   '[ ]'$  All sytems reviewed and are negative  Cardiac: '[]'$  chest pain/pressure '[]'$  SOB '[]'$  DOE  Vascular: '[]'$  pain in legs while walking '[]'$  pain in feet when lying flat '[]'$  hx of DVT '[]'$  swelling in legs  Pulmonary: '[]'$  asthma '[]'$  wheezing  Neurologic '[x]'$  hx CVA  Hematologic: '[]'$  bleeding problems  GI '[]'$  hx IBS  GU: '[x]'$  CKD/renal failure  '[x]'$  HD---'[x]'$  M/W/F '[]'$  T/T/S  Psychiatric: '[]'$  hx of major depression  Integumentary: '[]'$  rashes '[]'$  ulcers  Constitutional: '[]'$  fever '[]'$  chills   PHYSICAL EXAMINATION:  Today's Vitals   08/02/20 1144  BP: (!) 150/57  Pulse: 73  Resp: 20  Temp: 98.1 F (36.7 C)  TempSrc: Temporal  SpO2: 97%  Weight: 172 lb (78 kg)  Height: '5\' 10"'$  (1.778 m)   Body mass index is 24.68 kg/m.    General:  WDWN female in NAD Gait: Not observed HENT: WNL Pulmonary: normal non-labored breathing , without Rales, rhonchi,  wheezing Cardiac: regular with +murmur, withcarotid bruits bilaterally right>left Abdomen: soft, NT, no masses Skin: without rashes Vascular Exam/Pulses:   Right Left   Radial 2+ (normal) 2+ (normal)   Extremities:  without ischemic changes, without Gangrene, without open wounds; shrinker in place left leg Musculoskeletal: no muscle wasting or atrophy  Neurologic: A&O X 3; Speech is fluent/normal  Non-Invasive Vascular Imaging:   Upper Extremity Vein Mapping on 08/02/2020: Diameter:  0.56-0.91cm Depth:  0.22-1.06cm  -competing branches present   ASSESSMENT/PLAN: 55 y.o. female with ESRD here for evaluation of her hemodialysis access with hx of left BC AVF and TDC placement on 01/09/2020 by Dr. Stanford Breed.  ESRD -pt does not have evidence of steal and she has a palpable left radial pulse. Her motor and sensory are in tact and hand is warm. Her fistula has matured nicely, however it is deep and difficult to palpate proximally.  She will be scheduled with Dr. Oneida Alar for superficialization on a non-dialysis day. -pt is on dialysis -pt is righ hand dominant -pt is not on anticoagulation  Carotid bruits -pt has not had any stroke sx.  She had a carotid duplex in 2019, which revealed 1-39% bilateral ICA stenosis.  Carotid bruits right>left heard on exam today.  Given she is a smoker, will bring her back at her post operative visit for the superficialization and at that time, get a carotid duplex.   Current smoker -discussed importance of smoking cessation with pt.    Leontine Locket, [facility] [phone]  Clinic MD:   Oneida Alar

## 2020-08-07 ENCOUNTER — Other Ambulatory Visit: Payer: Self-pay

## 2020-08-07 DIAGNOSIS — R0989 Other specified symptoms and signs involving the circulatory and respiratory systems: Secondary | ICD-10-CM

## 2020-08-20 ENCOUNTER — Telehealth: Payer: Self-pay

## 2020-08-20 NOTE — Telephone Encounter (Signed)
Received voicemail from pt requesting to cxl left arm AVF superficialization surgery on tomorrow. Attempted to reach pt to discuss rescheduling. Left message requesting return call.

## 2020-08-21 ENCOUNTER — Inpatient Hospital Stay (HOSPITAL_COMMUNITY): Admission: RE | Admit: 2020-08-21 | Payer: Medicare Other | Source: Home / Self Care | Admitting: Vascular Surgery

## 2020-08-21 ENCOUNTER — Encounter (HOSPITAL_COMMUNITY): Admission: RE | Payer: Self-pay | Source: Home / Self Care

## 2020-08-21 SURGERY — FISTULA SUPERFICIALIZATION
Anesthesia: Choice | Laterality: Left

## 2020-08-21 NOTE — Telephone Encounter (Signed)
Called pt tin attempts to r/s surgery. Left message for pt to return call.

## 2020-08-22 NOTE — Telephone Encounter (Signed)
Still unable to reach pt in attempt to r/s surgery. Will mail UTR letter.

## 2020-11-10 IMAGING — DX DG CHEST 1V PORT
1 series · 1 of 1 positions shown · non-contrast
Comparison: April 07, 2017

CLINICAL DATA: Pain following fall

EXAM:
PORTABLE CHEST 1 VIEW

[chest ap]
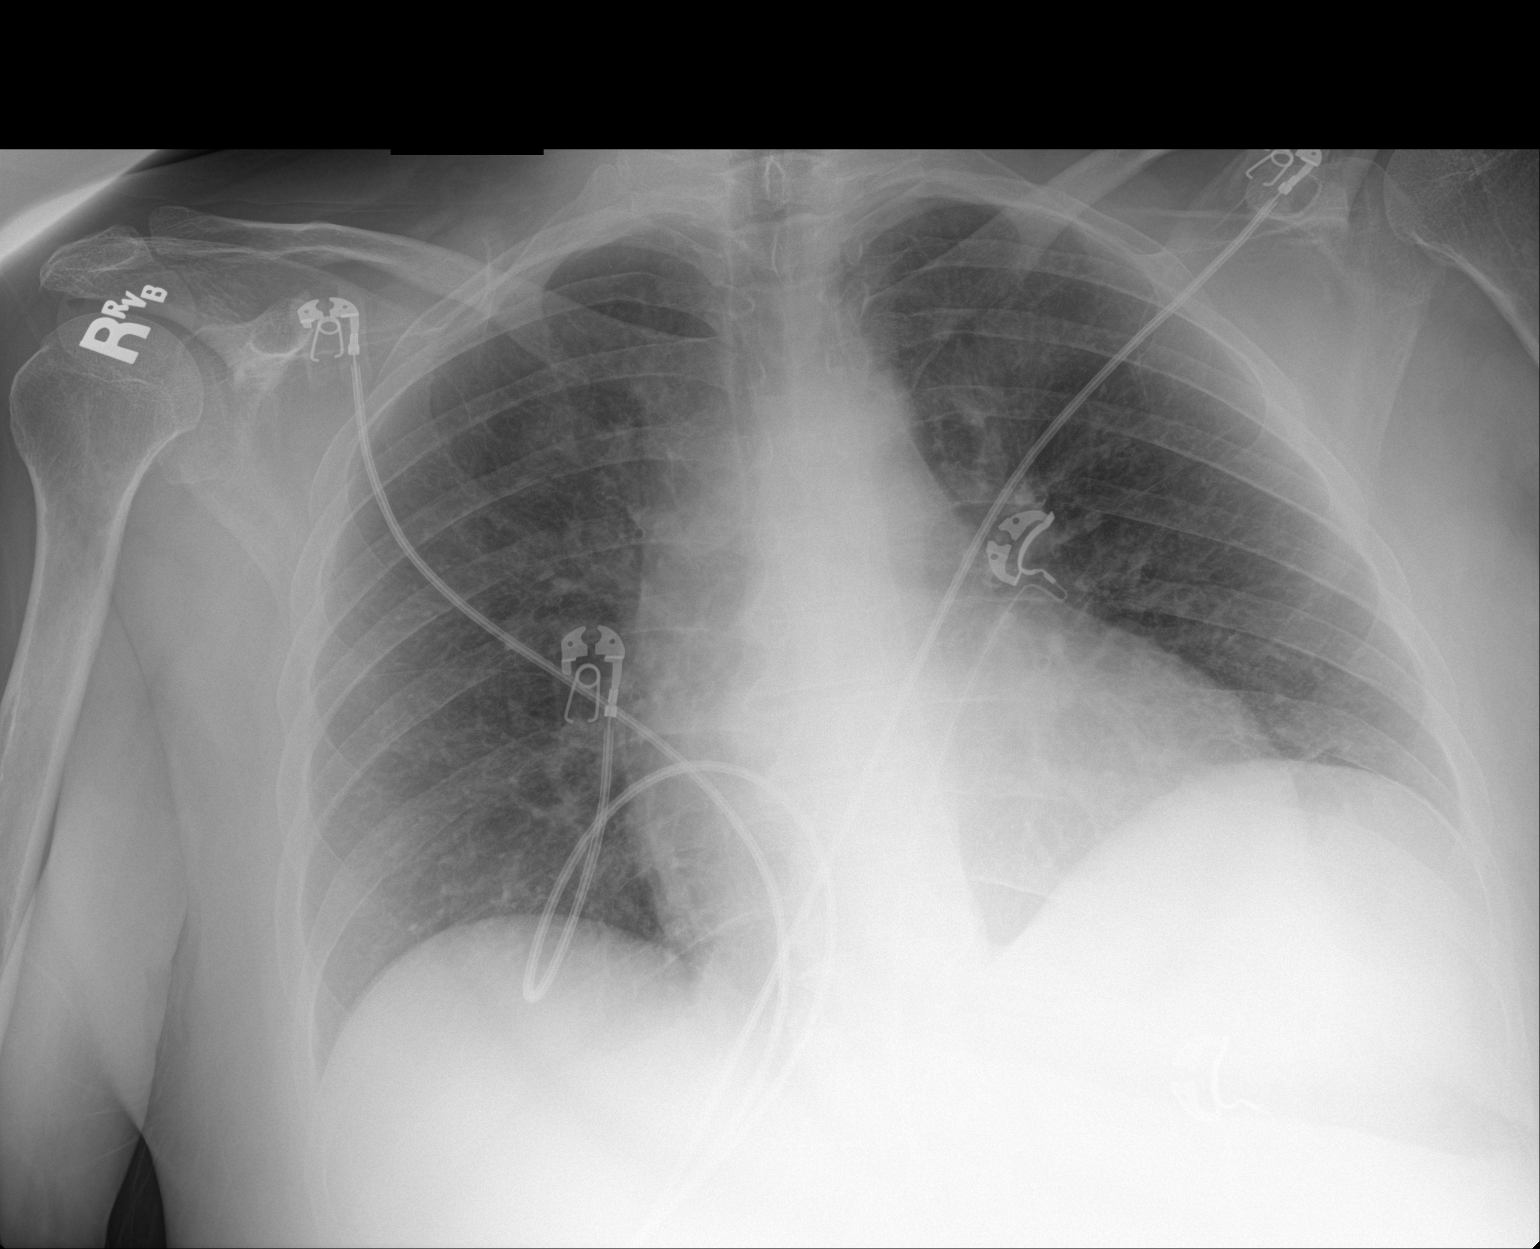

[1 of 1 positions shown; findings below may reference images not displayed]

FINDINGS: The lungs are clear. Heart is upper normal in size with pulmonary
vascularity normal. No adenopathy. No pneumothorax. No bone lesions.
IMPRESSION: Lungs clear.  Heart upper normal in size.
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
## 2020-11-11 IMAGING — RF DG HIP (WITH PELVIS) OPERATIVE*L*
1 series · 4 of 4 positions shown · non-contrast
Comparison: Left hip radiographs 12/28/2019

CLINICAL DATA: Left hip fracture

EXAM:
OPERATIVE left HIP (WITH PELVIS IF PERFORMED) 4 VIEWS
TECHNIQUE: Fluoroscopic spot image(s) were submitted for interpretation
post-operatively.

[Series 1: unknown protocol · 0.20mm/px · 4 of 4 slices shown]
[im 1/4]
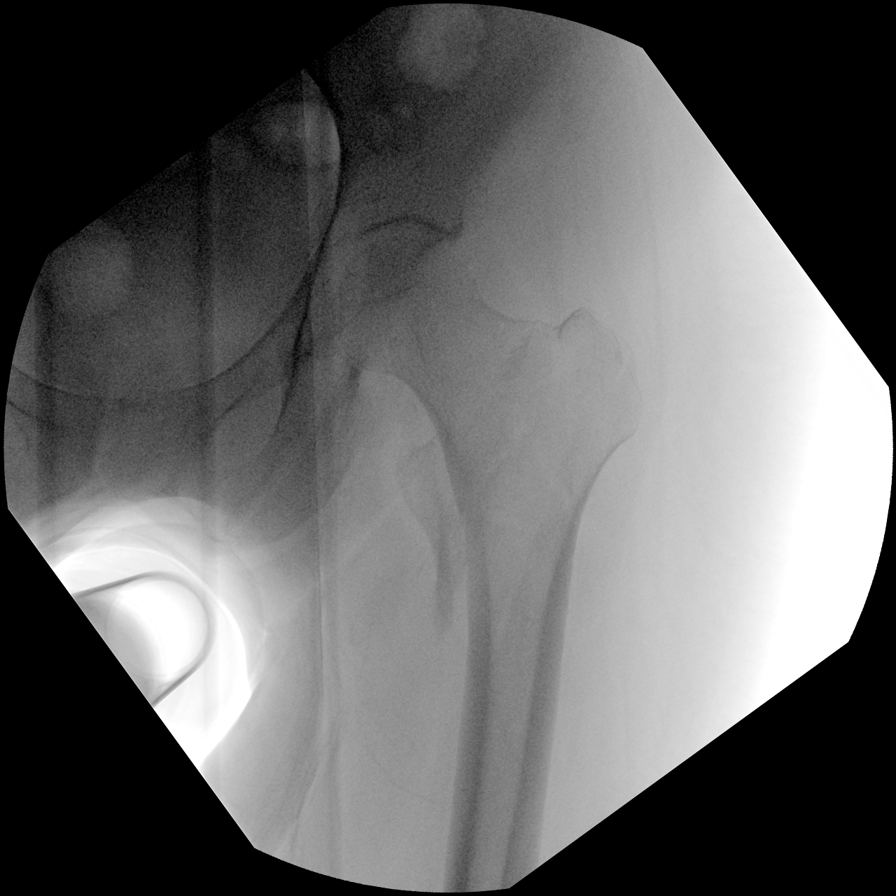
[im 2/4]
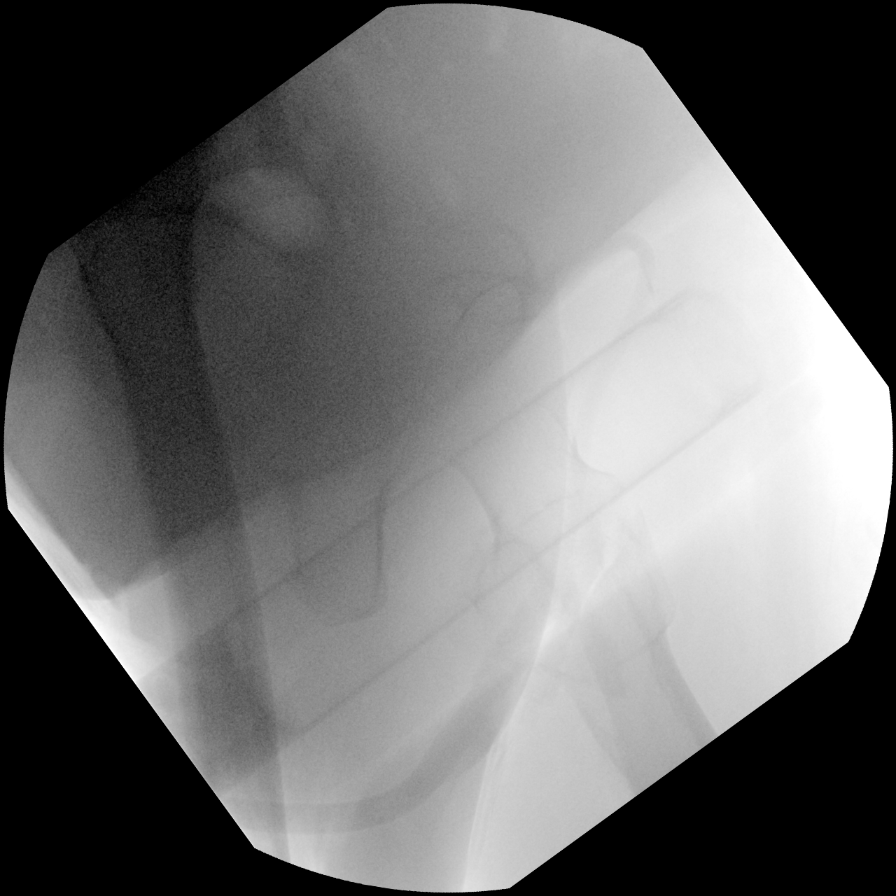
[im 3/4]
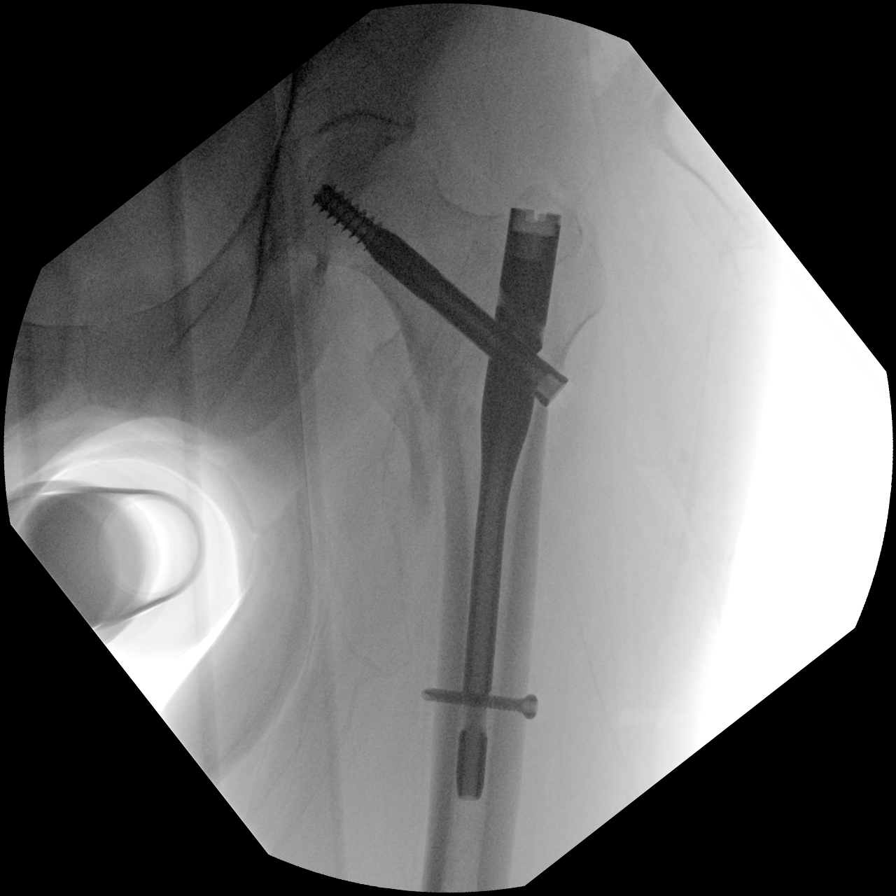
[im 4/4]
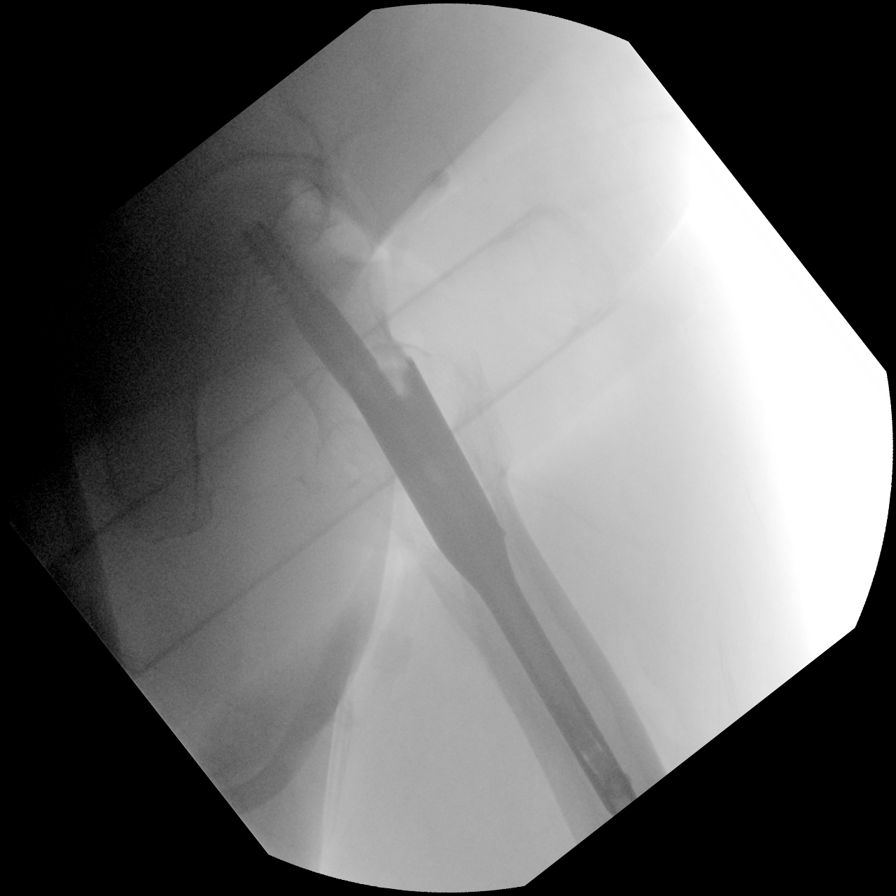

[4 of 4 positions shown; findings below may reference images not displayed]

FINDINGS: Left femur intertrochanteric fracture fixed with compression screw
and locking intramedullary rod. Satisfactory fracture alignment.
IMPRESSION: ORIF left intertrochanteric fracture.

## 2020-11-30 ENCOUNTER — Telehealth: Payer: Self-pay

## 2020-11-30 NOTE — Telephone Encounter (Signed)
Multiple attempts made to reach patient to reschedule L arm AVF superficialization and UTR letter mailed.

## 2021-07-21 ENCOUNTER — Emergency Department: Payer: Medicare Other

## 2021-07-21 ENCOUNTER — Other Ambulatory Visit: Payer: Self-pay

## 2021-07-21 ENCOUNTER — Inpatient Hospital Stay
Admission: EM | Admit: 2021-07-21 | Discharge: 2021-08-07 | DRG: 919 | Disposition: A | Discharge status: Other Acute Inpatient Hospital | Payer: Medicare Other | Source: Other Acute Inpatient Hospital | Attending: Internal Medicine | Admitting: Internal Medicine

## 2021-07-21 ENCOUNTER — Observation Stay: Payer: Medicare Other

## 2021-07-21 DIAGNOSIS — R799 Abnormal finding of blood chemistry, unspecified: Secondary | ICD-10-CM

## 2021-07-21 DIAGNOSIS — N186 End stage renal disease: Secondary | ICD-10-CM | POA: Diagnosis not present

## 2021-07-21 DIAGNOSIS — Z79899 Other long term (current) drug therapy: Secondary | ICD-10-CM

## 2021-07-21 DIAGNOSIS — S72402A Unspecified fracture of lower end of left femur, initial encounter for closed fracture: Secondary | ICD-10-CM | POA: Diagnosis present

## 2021-07-21 DIAGNOSIS — Z794 Long term (current) use of insulin: Secondary | ICD-10-CM

## 2021-07-21 DIAGNOSIS — E669 Obesity, unspecified: Secondary | ICD-10-CM | POA: Diagnosis present

## 2021-07-21 DIAGNOSIS — D631 Anemia in chronic kidney disease: Secondary | ICD-10-CM | POA: Diagnosis present

## 2021-07-21 DIAGNOSIS — Z89512 Acquired absence of left leg below knee: Secondary | ICD-10-CM

## 2021-07-21 DIAGNOSIS — E1140 Type 2 diabetes mellitus with diabetic neuropathy, unspecified: Secondary | ICD-10-CM | POA: Diagnosis present

## 2021-07-21 DIAGNOSIS — M009 Pyogenic arthritis, unspecified: Secondary | ICD-10-CM | POA: Diagnosis present

## 2021-07-21 DIAGNOSIS — G4700 Insomnia, unspecified: Secondary | ICD-10-CM | POA: Diagnosis present

## 2021-07-21 DIAGNOSIS — E1165 Type 2 diabetes mellitus with hyperglycemia: Secondary | ICD-10-CM | POA: Diagnosis present

## 2021-07-21 DIAGNOSIS — S72352A Displaced comminuted fracture of shaft of left femur, initial encounter for closed fracture: Secondary | ICD-10-CM

## 2021-07-21 DIAGNOSIS — I10 Essential (primary) hypertension: Secondary | ICD-10-CM | POA: Diagnosis present

## 2021-07-21 DIAGNOSIS — E872 Acidosis, unspecified: Secondary | ICD-10-CM

## 2021-07-21 DIAGNOSIS — Z79891 Long term (current) use of opiate analgesic: Secondary | ICD-10-CM

## 2021-07-21 DIAGNOSIS — I120 Hypertensive chronic kidney disease with stage 5 chronic kidney disease or end stage renal disease: Secondary | ICD-10-CM | POA: Diagnosis present

## 2021-07-21 DIAGNOSIS — E875 Hyperkalemia: Secondary | ICD-10-CM

## 2021-07-21 DIAGNOSIS — F419 Anxiety disorder, unspecified: Secondary | ICD-10-CM | POA: Diagnosis not present

## 2021-07-21 DIAGNOSIS — M8668 Other chronic osteomyelitis, other site: Secondary | ICD-10-CM | POA: Diagnosis present

## 2021-07-21 DIAGNOSIS — S7292XA Unspecified fracture of left femur, initial encounter for closed fracture: Secondary | ICD-10-CM

## 2021-07-21 DIAGNOSIS — D72829 Elevated white blood cell count, unspecified: Secondary | ICD-10-CM | POA: Diagnosis present

## 2021-07-21 DIAGNOSIS — T8579XA Infection and inflammatory reaction due to other internal prosthetic devices, implants and grafts, initial encounter: Secondary | ICD-10-CM | POA: Diagnosis not present

## 2021-07-21 DIAGNOSIS — E11649 Type 2 diabetes mellitus with hypoglycemia without coma: Secondary | ICD-10-CM | POA: Diagnosis not present

## 2021-07-21 DIAGNOSIS — Z993 Dependence on wheelchair: Secondary | ICD-10-CM

## 2021-07-21 DIAGNOSIS — Z885 Allergy status to narcotic agent status: Secondary | ICD-10-CM

## 2021-07-21 DIAGNOSIS — S72412A Displaced unspecified condyle fracture of lower end of left femur, initial encounter for closed fracture: Secondary | ICD-10-CM | POA: Diagnosis present

## 2021-07-21 DIAGNOSIS — Y831 Surgical operation with implant of artificial internal device as the cause of abnormal reaction of the patient, or of later complication, without mention of misadventure at the time of the procedure: Secondary | ICD-10-CM | POA: Diagnosis present

## 2021-07-21 DIAGNOSIS — E785 Hyperlipidemia, unspecified: Secondary | ICD-10-CM | POA: Diagnosis present

## 2021-07-21 DIAGNOSIS — E871 Hypo-osmolality and hyponatremia: Secondary | ICD-10-CM | POA: Diagnosis present

## 2021-07-21 DIAGNOSIS — Z6831 Body mass index (BMI) 31.0-31.9, adult: Secondary | ICD-10-CM

## 2021-07-21 DIAGNOSIS — Z91040 Latex allergy status: Secondary | ICD-10-CM

## 2021-07-21 DIAGNOSIS — S72142S Displaced intertrochanteric fracture of left femur, sequela: Secondary | ICD-10-CM | POA: Diagnosis present

## 2021-07-21 DIAGNOSIS — F17200 Nicotine dependence, unspecified, uncomplicated: Secondary | ICD-10-CM | POA: Diagnosis present

## 2021-07-21 DIAGNOSIS — F17210 Nicotine dependence, cigarettes, uncomplicated: Secondary | ICD-10-CM | POA: Diagnosis present

## 2021-07-21 DIAGNOSIS — E1122 Type 2 diabetes mellitus with diabetic chronic kidney disease: Secondary | ICD-10-CM | POA: Diagnosis present

## 2021-07-21 DIAGNOSIS — Z888 Allergy status to other drugs, medicaments and biological substances status: Secondary | ICD-10-CM

## 2021-07-21 DIAGNOSIS — Z992 Dependence on renal dialysis: Secondary | ICD-10-CM

## 2021-07-21 DIAGNOSIS — G894 Chronic pain syndrome: Secondary | ICD-10-CM | POA: Diagnosis present

## 2021-07-21 DIAGNOSIS — Z5982 Transportation insecurity: Secondary | ICD-10-CM

## 2021-07-21 DIAGNOSIS — N2581 Secondary hyperparathyroidism of renal origin: Secondary | ICD-10-CM | POA: Diagnosis present

## 2021-07-21 DIAGNOSIS — N390 Urinary tract infection, site not specified: Secondary | ICD-10-CM | POA: Diagnosis not present

## 2021-07-21 DIAGNOSIS — Z8673 Personal history of transient ischemic attack (TIA), and cerebral infarction without residual deficits: Secondary | ICD-10-CM

## 2021-07-21 DIAGNOSIS — S72492A Other fracture of lower end of left femur, initial encounter for closed fracture: Secondary | ICD-10-CM | POA: Diagnosis present

## 2021-07-21 DIAGNOSIS — E1169 Type 2 diabetes mellitus with other specified complication: Secondary | ICD-10-CM | POA: Diagnosis present

## 2021-07-21 DIAGNOSIS — L0291 Cutaneous abscess, unspecified: Secondary | ICD-10-CM | POA: Diagnosis present

## 2021-07-21 DIAGNOSIS — Z8249 Family history of ischemic heart disease and other diseases of the circulatory system: Secondary | ICD-10-CM

## 2021-07-21 DIAGNOSIS — W1830XA Fall on same level, unspecified, initial encounter: Secondary | ICD-10-CM | POA: Diagnosis present

## 2021-07-21 DIAGNOSIS — Z833 Family history of diabetes mellitus: Secondary | ICD-10-CM

## 2021-07-21 DIAGNOSIS — E1129 Type 2 diabetes mellitus with other diabetic kidney complication: Secondary | ICD-10-CM | POA: Diagnosis present

## 2021-07-21 DIAGNOSIS — Z91158 Patient's noncompliance with renal dialysis for other reason: Secondary | ICD-10-CM

## 2021-07-21 DIAGNOSIS — Z9049 Acquired absence of other specified parts of digestive tract: Secondary | ICD-10-CM

## 2021-07-21 DIAGNOSIS — J189 Pneumonia, unspecified organism: Secondary | ICD-10-CM | POA: Diagnosis not present

## 2021-07-21 DIAGNOSIS — F32A Depression, unspecified: Secondary | ICD-10-CM | POA: Diagnosis present

## 2021-07-21 DIAGNOSIS — L02416 Cutaneous abscess of left lower limb: Secondary | ICD-10-CM | POA: Diagnosis present

## 2021-07-21 DIAGNOSIS — Y95 Nosocomial condition: Secondary | ICD-10-CM | POA: Diagnosis present

## 2021-07-21 LAB — CBC
HCT: 28 % — ABNORMAL LOW (ref 36.0–46.0)
Hemoglobin: 9 g/dL — ABNORMAL LOW (ref 12.0–15.0)
MCH: 28.8 pg (ref 26.0–34.0)
MCHC: 32.1 g/dL (ref 30.0–36.0)
MCV: 89.7 fL (ref 80.0–100.0)
Platelets: 277 10*3/uL (ref 150–400)
RBC: 3.12 MIL/uL — ABNORMAL LOW (ref 3.87–5.11)
RDW: 16.6 % — ABNORMAL HIGH (ref 11.5–15.5)
WBC: 17.2 10*3/uL — ABNORMAL HIGH (ref 4.0–10.5)
nRBC: 0 % (ref 0.0–0.2)

## 2021-07-21 LAB — COMPREHENSIVE METABOLIC PANEL
ALT: 8 U/L (ref 0–44)
AST: 12 U/L — ABNORMAL LOW (ref 15–41)
Albumin: 2.9 g/dL — ABNORMAL LOW (ref 3.5–5.0)
Alkaline Phosphatase: 192 U/L — ABNORMAL HIGH (ref 38–126)
Anion gap: 25 — ABNORMAL HIGH (ref 5–15)
BUN: 160 mg/dL — ABNORMAL HIGH (ref 6–20)
CO2: 13 mmol/L — ABNORMAL LOW (ref 22–32)
Calcium: 8.3 mg/dL — ABNORMAL LOW (ref 8.9–10.3)
Chloride: 89 mmol/L — ABNORMAL LOW (ref 98–111)
Creatinine, Ser: 14.43 mg/dL — ABNORMAL HIGH (ref 0.44–1.00)
GFR, Estimated: 3 mL/min — ABNORMAL LOW (ref >60)
Glucose, Bld: 126 mg/dL — ABNORMAL HIGH (ref 70–99)
Potassium: 6.1 mmol/L — ABNORMAL HIGH (ref 3.5–5.1)
Sodium: 127 mmol/L — ABNORMAL LOW (ref 135–145)
Total Bilirubin: 1.3 mg/dL — ABNORMAL HIGH (ref 0.3–1.2)
Total Protein: 7.7 g/dL (ref 6.5–8.1)

## 2021-07-21 LAB — GLUCOSE, CAPILLARY
Glucose-Capillary: 234 mg/dL — ABNORMAL HIGH (ref 70–99)
Glucose-Capillary: >600 mg/dL (ref 70–99)

## 2021-07-21 LAB — LACTIC ACID, PLASMA: Lactic Acid, Venous: 0.8 mmol/L (ref 0.5–1.9)

## 2021-07-21 MED ORDER — ATORVASTATIN CALCIUM 20 MG PO TABS
40.0000 mg | ORAL_TABLET | Freq: Every day | ORAL | Status: DC
  Administered 2021-07-22 – 2021-08-06 (×16): 40 mg via ORAL
  Filled 2021-07-21 (×16): qty 2

## 2021-07-21 MED ORDER — MORPHINE SULFATE (PF) 2 MG/ML IV SOLN
2.0000 mg | INTRAVENOUS | Status: AC | PRN
  Administered 2021-07-21 – 2021-07-23 (×4): 2 mg via INTRAVENOUS
  Filled 2021-07-21 (×5): qty 1

## 2021-07-21 MED ORDER — HYDRALAZINE HCL 20 MG/ML IJ SOLN: 5.0000 mg | Freq: Four times a day (QID) | INTRAMUSCULAR | Status: AC | PRN

## 2021-07-21 MED ORDER — PATIROMER SORBITEX CALCIUM 8.4 G PO PACK
8.4000 g | PACK | Freq: Every day | ORAL | Status: DC
  Administered 2021-07-21 – 2021-07-24 (×3): 8.4 g via ORAL
  Filled 2021-07-21 (×6): qty 1

## 2021-07-21 MED ORDER — CYCLOBENZAPRINE HCL 10 MG PO TABS
5.0000 mg | ORAL_TABLET | Freq: Three times a day (TID) | ORAL | Status: DC | PRN
  Administered 2021-07-22 – 2021-08-06 (×15): 5 mg via ORAL
  Filled 2021-07-21 (×15): qty 1

## 2021-07-21 MED ORDER — ACETAMINOPHEN 325 MG PO TABS
650.0000 mg | ORAL_TABLET | Freq: Four times a day (QID) | ORAL | Status: AC | PRN
  Administered 2021-07-22: 650 mg via ORAL
  Filled 2021-07-21 (×2): qty 2

## 2021-07-21 MED ORDER — SEVELAMER CARBONATE 800 MG PO TABS
1600.0000 mg | ORAL_TABLET | Freq: Three times a day (TID) | ORAL | Status: DC
  Administered 2021-07-22 – 2021-08-07 (×43): 1600 mg via ORAL
  Filled 2021-07-21 (×44): qty 2

## 2021-07-21 MED ORDER — DEXTROSE 50 % IV SOLN
1.0000 | Freq: Once | INTRAVENOUS | Status: AC
  Administered 2021-07-21: 50 mL via INTRAVENOUS
  Filled 2021-07-21: qty 50

## 2021-07-21 MED ORDER — ONDANSETRON HCL 4 MG PO TABS: 4.0000 mg | ORAL_TABLET | Freq: Four times a day (QID) | ORAL | Status: DC | PRN

## 2021-07-21 MED ORDER — NICOTINE 21 MG/24HR TD PT24: 21.0000 mg | MEDICATED_PATCH | Freq: Every day | TRANSDERMAL | Status: DC | PRN

## 2021-07-21 MED ORDER — CITALOPRAM HYDROBROMIDE 10 MG PO TABS
20.0000 mg | ORAL_TABLET | Freq: Every day | ORAL | Status: DC
  Administered 2021-07-22 – 2021-08-07 (×15): 20 mg via ORAL
  Filled 2021-07-21 (×16): qty 2

## 2021-07-21 MED ORDER — CARVEDILOL 3.125 MG PO TABS
6.2500 mg | ORAL_TABLET | Freq: Two times a day (BID) | ORAL | Status: DC
  Administered 2021-07-22 – 2021-08-06 (×25): 6.25 mg via ORAL
  Filled 2021-07-21 (×26): qty 2

## 2021-07-21 MED ORDER — INSULIN GLARGINE-YFGN 100 UNIT/ML ~~LOC~~ SOLN
12.0000 [IU] | Freq: Every day | SUBCUTANEOUS | Status: DC
Start: 2021-07-22 — End: 2021-07-22
  Administered 2021-07-22: 12 [IU] via SUBCUTANEOUS
  Filled 2021-07-21 (×2): qty 0.12

## 2021-07-21 MED ORDER — POLYETHYLENE GLYCOL 3350 17 G PO PACK: 17.0000 g | PACK | Freq: Every day | ORAL | Status: DC | PRN

## 2021-07-21 MED ORDER — AMITRIPTYLINE HCL 25 MG PO TABS
25.0000 mg | ORAL_TABLET | Freq: Every day | ORAL | Status: DC
Start: 2021-07-22 — End: 2021-08-07
  Administered 2021-07-22 – 2021-08-06 (×17): 25 mg via ORAL
  Filled 2021-07-21 (×17): qty 1

## 2021-07-21 MED ORDER — ACETAMINOPHEN 650 MG RE SUPP: 650.0000 mg | Freq: Four times a day (QID) | RECTAL | Status: AC | PRN

## 2021-07-21 MED ORDER — ONDANSETRON HCL 4 MG/2ML IJ SOLN
4.0000 mg | Freq: Four times a day (QID) | INTRAMUSCULAR | Status: DC | PRN
  Administered 2021-07-28 – 2021-07-31 (×3): 4 mg via INTRAVENOUS
  Filled 2021-07-21 (×2): qty 2

## 2021-07-21 MED ORDER — HEPARIN SODIUM (PORCINE) 5000 UNIT/ML IJ SOLN
5000.0000 [IU] | Freq: Three times a day (TID) | INTRAMUSCULAR | Status: DC
  Administered 2021-07-22 – 2021-07-28 (×19): 5000 [IU] via SUBCUTANEOUS
  Filled 2021-07-21 (×19): qty 1

## 2021-07-21 MED ORDER — INSULIN ASPART 100 UNIT/ML IV SOLN
5.0000 [IU] | Freq: Once | INTRAVENOUS | Status: AC
  Administered 2021-07-21: 5 [IU] via INTRAVENOUS
  Filled 2021-07-21: qty 0.05

## 2021-07-21 MED ORDER — DULOXETINE HCL 30 MG PO CPEP
60.0000 mg | ORAL_CAPSULE | Freq: Every day | ORAL | Status: DC
  Administered 2021-07-22 – 2021-08-07 (×15): 60 mg via ORAL
  Filled 2021-07-21 (×15): qty 2

## 2021-07-21 MED ORDER — AMLODIPINE BESYLATE 5 MG PO TABS
5.0000 mg | ORAL_TABLET | Freq: Every day | ORAL | Status: DC
  Administered 2021-07-25 – 2021-08-06 (×9): 5 mg via ORAL
  Filled 2021-07-21 (×11): qty 1

## 2021-07-21 MED ORDER — SODIUM BICARBONATE 8.4 % IV SOLN
50.0000 meq | Freq: Once | INTRAVENOUS | Status: AC
  Administered 2021-07-21: 50 meq via INTRAVENOUS
  Filled 2021-07-21: qty 50

## 2021-07-21 MED ORDER — FENTANYL CITRATE PF 50 MCG/ML IJ SOSY
25.0000 ug | PREFILLED_SYRINGE | Freq: Once | INTRAMUSCULAR | Status: AC
  Administered 2021-07-21: 25 ug via INTRAVENOUS
  Filled 2021-07-21: qty 1

## 2021-07-21 MED ORDER — FENTANYL CITRATE PF 50 MCG/ML IJ SOSY
25.0000 ug | PREFILLED_SYRINGE | INTRAMUSCULAR | Status: DC | PRN
  Administered 2021-07-22 – 2021-07-23 (×2): 25 ug via INTRAVENOUS
  Filled 2021-07-21 (×2): qty 1

## 2021-07-21 MED ORDER — ONDANSETRON HCL 4 MG/2ML IJ SOLN
4.0000 mg | Freq: Once | INTRAMUSCULAR | Status: AC
  Administered 2021-07-21: 4 mg via INTRAVENOUS
  Filled 2021-07-21: qty 2

## 2021-07-21 MED ORDER — HYDROMORPHONE HCL 1 MG/ML IJ SOLN
1.0000 mg | Freq: Once | INTRAMUSCULAR | Status: AC
  Administered 2021-07-21: 1 mg via INTRAVENOUS
  Filled 2021-07-21: qty 1

## 2021-07-21 NOTE — ED Notes (Signed)
Request made for transport to the floor ?

## 2021-07-21 NOTE — H&P (Addendum)
History and Physical   REGINALD MANGELS XBW:[id number] DOB: 05-Sep-1965 DOA: 07/21/2021  PCP: Julian Hy, PA-C  Patient coming from: Home via EMS  I have personally briefly reviewed patient's old medical records in [place].  Chief Concern: Right lower extremity pain and missing dialysis for about a week  HPI: [name] is a 56 year old female with history of hypertension, hyperlipidemia, end-stage renal disease on HD MWF, history of left BKA in 2021, depression, anxiety, insulin-dependent diabetes mellitus, insomnia, chronic pain syndrome, who presents emergency department for chief concerns of pain from falling during transfer from wheelchair to recliner about 1 week ago.  Patient's last hemodialysis session was Friday, 07/12/2021.  Initial vitals in the emergency department showed temperature of 98.2, respiration rate of 18, heart rate of 70, blood pressure 104/9046, SPO2 of 94% on room air.  Serum sodium is 127, potassium 6.1, chloride 89, bicarb 13, BUN of 160, he serum creatinine of 14.43, GFR of 3, nonfasting blood glucose 126, WBC 17.2, hemoglobin 9.0, platelets of 277.  ED treatment: Insulin aspart 5 units, fentanyl 25 mcg, Dilaudid 1 mg IV, ondansetron 4 mg IV, bicarb 50 mill equivalent IV.  At bedside patient is able to tell me her full name, age, she knows she is in the hospital.  She is able to identify her daughter at bedside  She was able to eat a barbecue pork sandwich, takeout when I entered the room.  She reports that she was transferring from her wheelchair onto a lounge chair at home when she fell 1 week ago.  She states she has been in so much pain, that she has not been able to tolerate much movement nor has she been able to go to dialysis.  Her last hemodialysis session was 07/12/2021.  She denies chest pain, abdominal pain, diarrhea, loss of consciousness.  She endorses she is still makes occasional urine.  Social history: She and her daughter  live together.  She endorses tobacco use, about 1 pack/day.  She denies EtOH and recreational drug use.  ROS: Constitutional: no weight change, no fever ENT/Mouth: no sore throat, no rhinorrhea Eyes: no eye pain, no vision changes Cardiovascular: no chest pain, no dyspnea,  no edema, no palpitations Respiratory: no cough, no sputum, no wheezing Gastrointestinal: no nausea, no vomiting, no diarrhea, no constipation Genitourinary: no urinary incontinence, no dysuria, no hematuria Musculoskeletal: no arthralgias, no myalgias Skin: no skin lesions, no pruritus, Neuro: + weakness, no loss of consciousness, no syncope Psych: no anxiety, no depression, + decrease appetite Heme/Lymph: no bruising, no bleeding  ED Course: Discussed with emergency medicine provider, patient requiring hospitalization for missing over a week of dialysis.  Assessment/Plan  Principal Problem:   Femur fracture, left (HCC) Active Problems:   TOBACCO DEPENDENCE   Essential hypertension, benign   Anxiety and depression   Insomnia   Obesity (BMI 30.0-34.9)   Hyponatremia   ESRD on dialysis [facility])   Essential hypertension   Hyperkalemia   Assessment and Plan: * Femur fracture, left [facility]) - Orthopedic service has been consulted by EDP, who recommends no surgery while inpatient - Orthopedic service recommends brace in flexion of the left knee, nonweightbearing - Patient to follow-up outpatient with orthopedic provider in 1 week after discharge  Hyperkalemia - Presumed secondary to missing hemodialysis session - Status post [place], insulin 5 units, dextrose 1 amp, sodium bicarbonate injection - Nephrology has been consulted and recommended the above  Essential hypertension - Amlodipine 5 mg daily, carvedilol 6.25 mg  p.o. twice daily -Hydralazine 5 mg IV every 6 hours.  For SBP greater than 180, 3 doses ordered  ESRD on dialysis [facility]) - Nephrology has been consulted via EDP  Anxiety and depression -  Amitriptyline 25 mg nightly, Celexa 20 mg daily, duloxetine 60 mg daily  Essential hypertension, benign - Patient takes amlodipine 5 mg daily, carvedilol 6.25 mg p.o. twice daily, these have been resumed for 07/22/2021 due to low normotensive in setting of requiring hemodialysis - Hydralazine 100 mg 3 times daily have not been resumed due to the same  TOBACCO DEPENDENCE - Nicotine patch as needed for nicotine craving ordered  Chart reviewed.   DVT prophylaxis: Heparin 5000 units subcutaneous every 8 hours Code Status: Full code Diet: Renal Family Communication: Updated daughter at bedside with patient's permission Disposition Plan: Pending clinical course Consults called: Nephrology, orthopedic Admission status: MedSurg, observation  Past Medical History:  Diagnosis Date   Anxiety 2002   Chest tightness    Depression 2001   Diabetes type 1, uncontrolled    at age 62   Diabetic neuropathy ([place])    Essential hypertension 2015   GERD (gastroesophageal reflux disease)    about age of 42   Nausea and vomiting in adult    Stroke [facility])    Urinary frequency    Yeast vaginitis    Past Surgical History:  Procedure Laterality Date   ACHILLES TENDON SURGERY Left 03/10/2013   Procedure: LEFT CHOPART AMPUTATION/ LEFT TENDON ACHILLES [place];  Surgeon: Wylene Simmer, MD;  Location: [place];  Service: Orthopedics;  Laterality: Left;   AMPUTATION Left 06/15/2012   Procedure: AMPUTATION DIGIT;  Surgeon: Meredith Pel, MD;  Location: [place];  Service: Orthopedics;  Laterality: Left;  Left great toe revision amputation   AMPUTATION Left 01/12/2020   Procedure: AMPUTATION BELOW KNEE;  Surgeon: Wylene Simmer, MD;  Location: [place];  Service: Orthopedics;  Laterality: Left;   AV FISTULA PLACEMENT Left 01/09/2020   Procedure: LEFT UPPER ARM ARTERIOVENOUS (AV) FISTULA CREATION;  Surgeon: Cherre Robins, MD;  Location: [place];  Service: Vascular;  Laterality: Left;   ENDOMETRIAL ABLATION     I & D  EXTREMITY Left 01/19/2020   Procedure: IRRIGATION AND DEBRIDEMENT Left Hip;  Surgeon: Rod Can, MD;  Location: [place];  Service: Orthopedics;  Laterality: Left;   INSERTION OF DIALYSIS CATHETER Right 01/09/2020   Procedure: INSERTION OF DIALYSIS CATHETER;  Surgeon: Cherre Robins, MD;  Location: [place];  Service: Vascular;  Laterality: Right;   INTRAMEDULLARY (IM) NAIL INTERTROCHANTERIC Left 12/29/2019   Procedure: INTRAMEDULLARY (IM) NAIL INTERTROCHANTRIC;  Surgeon: Rod Can, MD;  Location: [facility];  Service: Orthopedics;  Laterality: Left;   IR ANGIOGRAM EXTREMITY LEFT  12/17/2018   IR FEM POP ART ATHERECT INC PTA MOD SED  12/17/2018   IR RADIOLOGIST EVAL & MGMT  11/30/2018   IR RADIOLOGIST EVAL & MGMT  12/09/2018   IR RADIOLOGIST EVAL & MGMT  02/08/2019   IR US GUIDE VASC ACCESS RIGHT  12/17/2018   LAPAROSCOPIC CHOLECYSTECTOMY  01/30/2015   [facility] day surgery    Social History:  reports that she has been smoking cigarettes. She has a 5.50 pack-year smoking history. She has never used smokeless tobacco. She reports current alcohol use. She reports that she does not use drugs.  Allergies  Allergen Reactions   Trazodone Swelling   Latex Rash   Lidocaine Itching   Family History  Problem Relation Age of Onset   Diabetes Mother  Heart attack Mother    Heart disease Mother        before age 22   Hypertension Mother    Hyperlipidemia Mother    Diabetes Father    Heart attack Father    Heart disease Father        before age 58   Diabetes Sister    Hypertension Sister    Family history: Family history reviewed and not pertinent  Prior to Admission medications   Medication Sig Start Date End Date Taking? Authorizing Provider  alosetron (LOTRONEX) 1 MG tablet Take 1 mg by mouth 2 (two) times daily. 12/19/19   [provider]  amitriptyline (ELAVIL) 10 MG tablet Take 1 tablet (10 mg total) by mouth at bedtime. 02/21/20   Love, Ivan Anchors, PA-C  amitriptyline  (ELAVIL) 25 MG tablet Take 25 mg by mouth at bedtime. 07/02/21   [provider]  amLODipine (NORVASC) 5 MG tablet Take 1 tablet (5 mg total) by mouth daily. 02/21/20   Love, Ivan Anchors, PA-C  atorvastatin (LIPITOR) 40 MG tablet TAKE 1 TABLET (40 MG TOTAL) BY MOUTH DAILY AT 6 PM. 02/22/19   Fulp, Cammie, MD  ATORVASTATIN CALCIUM PO     [provider]  B Complex-C-Zn-Folic Acid (DIALYVITE 850 WITH ZINC) 0.8 MG TABS Take by mouth. 02/22/20   [provider]  Biotin 10 MG CAPS Take 1 capsule by mouth daily. 06/05/21   [provider]  carvedilol (COREG) 6.25 MG tablet Take 1 tablet (6.25 mg total) by mouth 2 (two) times daily. 02/21/20   Love, Ivan Anchors, PA-C  citalopram (CELEXA) 20 MG tablet Take 1 tablet (20 mg total) by mouth daily. 02/22/20   Love, Ivan Anchors, PA-C  cyclobenzaprine (FLEXERIL) 10 MG tablet Take 1 tablet by mouth 3 (three) times daily as needed. 07/28/20   [provider]  cyclobenzaprine (FLEXERIL) 5 MG tablet Take 1 tablet (5 mg total) by mouth 3 (three) times daily as needed for muscle spasms. 02/21/20   Love, Ivan Anchors, PA-C  D3-50 1.25 MG (50000 UT) capsule Take 50,000 Units by mouth once a week. 05/31/20   [provider]  DEXILANT 60 MG capsule Take 60 mg by mouth daily. 06/09/17   [provider]  DULoxetine (CYMBALTA) 30 MG capsule Take 1 capsule (30 mg total) by mouth daily. 02/22/20   Love, Ivan Anchors, PA-C  DULoxetine (CYMBALTA) 60 MG capsule Take 60 mg by mouth daily. 07/17/20   [provider]  hydrALAZINE (APRESOLINE) 100 MG tablet Take 1 tablet (100 mg total) by mouth 3 (three) times daily. 02/21/20   Love, Ivan Anchors, PA-C  HYDRALAZINE HCL PO     [provider]  insulin aspart (NOVOLOG FLEXPEN) 100 UNIT/ML FlexPen Inject into the skin. 03/28/20   [provider]  insulin glargine (LANTUS) 100 UNIT/ML injection Inject 0.12 mLs (12 Units total) into the skin at bedtime. 02/21/20   Love, Ivan Anchors,  PA-C  iron sucrose in sodium chloride 0.9 % 100 mL Iron Sucrose (Venofer) 05/21/20   [provider]  melatonin 300 MCG TABS Take 5 tablets (1,500 mcg total) by mouth at bedtime. 02/21/20   Love, Ivan Anchors, PA-C  Menthol-Methyl Salicylate (MUSCLE RUB) 10-15 % CREA Apply 1 application topically as needed for muscle pain. 02/02/20   Love, Ivan Anchors, PA-C  morphine (MS CONTIN) 15 MG 12 hr tablet Take 1 tablet (15 mg total) by mouth every 12 (twelve) hours. 02/21/20   Bary Leriche, PA-C  naloxone (NARCAN) nasal spray 4 mg/0.1 mL 1 spray as directed. 04/27/20   [provider]  NOVOLOG MIX 70/30 FLEXPEN (70-30) 100 UNIT/ML FlexPen SMARTSIG:10 Unit(s) SUB-Q 3 Times Daily 05/27/21   [provider]  oxyCODONE (OXY IR/ROXICODONE) 5 MG immediate release tablet Take 1-2 tablets (5-10 mg total) by mouth every 6 (six) hours as needed for severe pain. 02/21/20   Love, Ivan Anchors, PA-C  sevelamer carbonate (RENVELA) 800 MG tablet Take 2 tablets (1,600 mg total) by mouth 3 (three) times daily with meals. 02/21/20   Love, Ivan Anchors, PA-C  sodium chloride (OCEAN) 0.65 % SOLN nasal spray Place 1 spray into both nostrils as needed for congestion (nose irritation). 02/02/20   Love, Ivan Anchors, PA-C  traMADol (ULTRAM) 50 MG tablet Take 1 tablet (50 mg total) by mouth every 6 (six) hours as needed for moderate pain. 02/21/20   Bary Leriche, PA-C  Zolpidem Tartrate (AMBIEN PO)     [provider]   Physical Exam: Vitals:   07/21/21 2045 07/21/21 2058 07/21/21 2227 07/22/21 0121  BP:  (!) 127/50 (!) 129/56   Pulse: 77  73   Resp: 14  20   Temp:  98.2 F (36.8 C) 98.4 F (36.9 C)   TempSrc:      SpO2: 97%  97%   Weight:      Height:    5\' 10"  (1.778 m)   Constitutional: appears age-appropriate, NAD, calm, comfortable Eyes: PERRL, lids and conjunctivae normal ENMT: Mucous membranes are moist. Posterior pharynx clear of any exudate or lesions. Age-appropriate dentition. Hearing  appropriate Neck: normal, supple, no masses, no thyromegaly Respiratory: clear to auscultation bilaterally, no wheezing, no crackles. Normal respiratory effort. No accessory muscle use.  Cardiovascular: Regular rate and rhythm, no murmurs / rubs / gallops. No extremity edema. 2+ pedal pulses. No carotid bruits.  Abdomen: no tenderness, no masses palpated, no hepatosplenomegaly. Bowel sounds positive.  Musculoskeletal: no clubbing / cyanosis. No joint deformity upper and lower extremities. Good ROM, no contractures, no atrophy. Normal muscle tone.  Skin: no rashes, lesions, ulcers. No induration Neurologic: Sensation intact. Strength 5/5 in all 4.  Psychiatric: Normal judgment and insight. Alert and oriented x 3. Normal mood.   EKG: independently reviewed, showing sinus rhythm with rate of 72, QTc 435  Chest x-ray on Admission: I personally reviewed and I agree with radiologist reading as below.  DG Chest Port 1 View  Result Date: 07/21/2021 CLINICAL DATA:  Femoral fracture. Medical clearance for surgical repair. EXAM: PORTABLE CHEST 1 VIEW COMPARISON:  None Available. FINDINGS: Lung volumes are small, but are symmetric. Interstitial opacity at the left lung base may represent atelectasis or infiltrate. No pneumothorax or pleural effusion. Cardiac size within normal limits. No acute bone abnormality. IMPRESSION: Pulmonary hypoinflation.  Left basilar atelectasis or infiltrate. Electronically Signed   By: Fidela Salisbury M.D.   On: 07/21/2021 21:15   DG Knee Complete 4 Views Left  Result Date: 07/21/2021 CLINICAL DATA:  Fall, left knee pain EXAM: LEFT KNEE - COMPLETE 4+ VIEW COMPARISON:  None Available. FINDINGS: Prior below the knee amputation. There is a comminuted fracture in the distal left femoral metaphysis. Mildly displaced fracture fragments anteriorly. Diffuse osteopenia. IMPRESSION: Prior below-knee amputation. Comminuted, mildly displaced distal left femoral fracture. Electronically  Signed   By: Rolm Baptise M.D.   On: 07/21/2021 18:25    Labs on Admission: I have personally reviewed following labs CBC: Recent Labs  Lab 07/21/21 1729  WBC 17.2*  HGB 9.0*  HCT 28.0*  MCV 89.7  PLT 081   Basic Metabolic Panel: Recent Labs  Lab 07/21/21 1729  NA 127*  K 6.1*  CL 89*  CO2 13*  GLUCOSE 126*  BUN 160*  CREATININE 14.43*  CALCIUM 8.3*   GFR: Estimated Creatinine Clearance: 4.8 mL/min (A) (by C-G formula based on SCr of 14.43 mg/dL (H)).  Liver Function Tests: Recent Labs  Lab 07/21/21 1729  AST 12*  ALT 8  ALKPHOS 192*  BILITOT 1.3*  PROT 7.7  ALBUMIN 2.9*   CBG: Recent Labs  Lab 07/21/21 2146 07/21/21 2155  GLUCAP >600* 234*   Urine analysis:    Component Value Date/Time   COLORURINE YELLOW 01/19/2020 1402   APPEARANCEUR TURBID (A) 01/19/2020 1402   LABSPEC 1.010 01/19/2020 1402   PHURINE 6.0 01/19/2020 1402   GLUCOSEU 50 (A) 01/19/2020 1402   GLUCOSEU NEGATIVE 03/29/2014 1259   HGBUR NEGATIVE 01/19/2020 1402   BILIRUBINUR NEGATIVE 01/19/2020 1402   BILIRUBINUR neg 02/16/2017 1501   KETONESUR NEGATIVE 01/19/2020 1402   PROTEINUR 100 (A) 01/19/2020 1402   UROBILINOGEN 0.2 02/16/2017 1501   UROBILINOGEN 0.2 03/29/2014 1259   NITRITE NEGATIVE 01/19/2020 1402   LEUKOCYTESUR TRACE (A) 01/19/2020 1402   Dr. Tobie Poet [facility]  If 7PM-7AM, please contact overnight-coverage provider If 7AM-7PM, please contact day coverage provider [email or web address]  07/22/2021, 2:51 AM

## 2021-07-21 NOTE — Assessment & Plan Note (Signed)
-   Patient takes amlodipine 5 mg daily, carvedilol 6.25 mg p.o. twice daily, these have been resumed for 07/22/2021 due to low normotensive in setting of requiring hemodialysis - Hydralazine 100 mg 3 times daily have not been resumed due to the same

## 2021-07-21 NOTE — ED Triage Notes (Addendum)
Pt comes pov after falling and hurting her left knee trying to transfer from wheelchair to recliner.   Pt is also a T1D and cbg was 130s this morning. Also get dialysis MWF but hasn't had it since last Friday due to transportation issues.

## 2021-07-21 NOTE — ED Notes (Signed)
ED Provider at bedside. 

## 2021-07-21 NOTE — Hospital Course (Addendum)
[name] is a 56 year old female with history of hypertension, hyperlipidemia, end-stage renal disease on HD MWF, history of left BKA in 2021, depression, anxiety, insulin-dependent diabetes mellitus, insomnia, chronic pain syndrome, who presents emergency department for chief concerns of pain from falling during transfer from wheelchair to recliner about 1 week ago. She missed dialysis for whole week.  Upon arriving the hospital, she was found to have a distal left femur fracture, deemed no surgery by orthopedics.  But no weightbearing.  Patient is also started on dialysis.   Was waiting on patient to decide on SNF facility for rehab since she was initially advised inpatient rehab but later stated she did not think she would be able to participate in intensive therapy. Pending dialysis spot but SNF placement was confirmed. Discharge was not considered however as WBC had been trending up, workup showed multifocal pneumonia on CXR despite no significant symptoms, started abx 07/27/21, obtained UA for completeness (still makes some urine and this was obtained 05/28 and abnormal), Urine Cx, sputum Cx, blood Cx are all negative.  Also 05/28 drained superficial abscess L hip and sent for culture which was also negative.  d/w ortho 05/29 and obtaining XR and MRI to eval for L hip septic arthritis. 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 05/30 Ortho recommended I&D and hardware removal and place abx beads but pt does not want to proceed w/ surgery despite extensive discussion risk/benefit. ID consulted.   5/31: Had a long discussion with Ortho.  Patient will be very high risk for any surgical intervention with concern of nonhealing wound and worsening of her underlying comorbidities.  Most likely will have infection recurrent.  They also does not want any incision and drainage as requested by ID to clean the pockets stating  that this infection will soon recur as she will need a very extensive debridement. Patient initially received cefepime which was later switched with meropenem based on her prior history of resistant Enterobacter and bacteroids. This is a very unfortunate situation, no definitive solution at this time.  6/1: Patient continued to experience significant pain in left leg.  She was to see her on orthopedic surgeon regarding her new fracture and continuation of infection.  6/2: Patient will be going for percutaneous abscess drainage with IR today. Nursing concern of dark-colored urine-UA ordered to rule out hematuria.  Most likely concentrated urine as patient is ESRD. CBG remained elevated-increase the dose of seemegle and switch her to moderate SSI Patient now had outpatient chair for dialysis, will be discharged once cleared from infectious disease.  6/3: declined CT guided Drainage procedure. Still considering surgical opinion from primary orthopedist.   6/5: Episode of becoming hypoglycemic, CBG at 65 requiring intervention.  Decrease Semglee to 15 twice daily. Dr. Lyla Martinez accepted her at [facility].  Patient only want her on orthopedic surgeon, Dr. Lyla Martinez to make decisions regarding her infected hardware and a new fracture. Patient will be transferred to [facility] for further management. She will be going under hospitalist services, please consult Dr. Lyla Martinez or his PA once patient is there.  6/6: Patient still waiting for transfer.  CBG at 91 in the morning.  Patient received only 1 dose of Semglee last night.  No additional insulin was given.  CBG now remained within goal and becoming hypoglycemic at times.  Decrease Semglee to nightly, decrease mealtime coverage to 3 units and make sliding scale sensitive. Repeat CT hip  was done by Dr. Lyla Martinez and it shows 1. Ununited left hip fractures with extensive lucency around the hip hardware consistent with extensive osteomyelitis. 2. 6 cm  posterior fluid collection/abscess appears to be a connecting with a sinus tract to the skin. 3. Probable septic arthritis, myofasciitis and cellulitis. 4. Associated myofasciitis and cellulitis.

## 2021-07-21 NOTE — ED Provider Notes (Signed)
[facility] Provider Note    Event Date/Time   First MD Initiated Contact with Patient 07/21/21 1922     (approximate)   History   Knee Injury   HPI  [name] is a 56 y.o. female with type 1 diabetes, ESRD on dialysis Monday Wednesday Friday but has not been dialyzed since Friday due to transportation issues who comes in with concern for falling and hurting her left knee trying to transfer from wheelchair to recliner.  Patient reports that the injury actually happened last Friday and that is why she did not go to dialysis.  The fall was witnessed by her daughter and she did not hit her head did not lose consciousness she reports that she fell directly onto her left leg.  She denies any chest pain, shortness of breath.  She reports that she has not gone to dialysis due to the significant pain in her knee.  Physical Exam   Triage Vital Signs: ED Triage Vitals  Enc Vitals Group     BP 07/21/21 1721 (!) 104/46     Pulse Rate 07/21/21 1721 70     Resp 07/21/21 1721 18     Temp 07/21/21 1721 98.2 F (36.8 C)     Temp Source 07/21/21 1721 Oral     SpO2 07/21/21 1721 94 %     Weight 07/21/21 1722 170 lb (77.1 kg)     Height --      Head Circumference --      Peak Flow --      Pain Score 07/21/21 1722 10     Pain Loc --      Pain Edu? --      Excl. in [place]? --     Most recent vital signs: Vitals:   07/21/21 1721  BP: (!) 104/46  Pulse: 70  Resp: 18  Temp: 98.2 F (36.8 C)  SpO2: 94%     General: Awake, no distress.  CV:  Good peripheral perfusion.  Resp:  Normal effort.  Abd:  No distention.  Other:  Patient has below-knee amputation that is warm and well-perfused with tenderness above the knee.  No hip tenderness   ED Results / Procedures / Treatments   Labs (all labs ordered are listed, but only abnormal results are displayed) Labs Reviewed  CBC - Abnormal; Notable for the following components:      Result Value   WBC 17.2 (*)     RBC 3.12 (*)    Hemoglobin 9.0 (*)    HCT 28.0 (*)    RDW 16.6 (*)    All other components within normal limits  COMPREHENSIVE METABOLIC PANEL - Abnormal; Notable for the following components:   Sodium 127 (*)    Potassium 6.1 (*)    Chloride 89 (*)    CO2 13 (*)    Glucose, Bld 126 (*)    BUN 160 (*)    Creatinine, Ser 14.43 (*)    Calcium 8.3 (*)    Albumin 2.9 (*)    AST 12 (*)    Alkaline Phosphatase 192 (*)    Total Bilirubin 1.3 (*)    GFR, Estimated 3 (*)    Anion gap 25 (*)    All other components within normal limits     EKG  My interpretation of EKG:  Normal sinus rate of 72, normal intervals, p wave inversion in lead 3, normal intervals.    RADIOLOGY I reviewed the x-ray personally and  patient has distal femur fracture with prior amputation  PROCEDURES:  Critical Care performed: Yes, see critical care procedure note(s)  .1-3 Lead EKG Interpretation Performed by: Vanessa [place], MD Authorized by: Vanessa [place], MD     Interpretation: normal     ECG rate:  70   ECG rate assessment: normal     Rhythm: sinus rhythm     Ectopy: none     Conduction: normal   .Critical Care Performed by: Vanessa Barnsd[place], MD Authorized by: Vanessa [place], MD   Critical care provider statement:    Critical care time (minutes):  30   Critical care was necessary to treat or prevent imminent or life-threatening deterioration of the following conditions:  Renal failure   Critical care was time spent personally by me on the following activities:  Development of treatment plan with patient or surrogate, discussions with consultants, evaluation of patient's response to treatment, examination of patient, ordering and review of laboratory studies, ordering and review of radiographic studies, ordering and performing treatments and interventions, pulse oximetry, re-evaluation of patient's condition and review of old [facility]: Medications  fentaNYL  (SUBLIMAZE) injection 25 mcg (25 mcg Intravenous Given 07/21/21 1911)  ondansetron (ZOFRAN) injection 4 mg (4 mg Intravenous Given 07/21/21 1911)     IMPRESSION / MDM / [place] / ED COURSE  I reviewed the triage vital signs and the nursing notes.  Patient is ESRD coming in with missed dialysis and knee pain.  This concerning for acute life-threatening pathology such as acidosis, hyperkalemia.   CBC shows slightly low hemoglobin but similar to prior.  White count is elevated but no other infectious symptoms.  BMP shows significant abnormalities with elevated BUN, potassium of 6.1 anion gap of 25  7:53 PM discussed the case with Dr.Korrapti-who recommends admission, 1 amp D50, insulin, bicarb, [place] and will dialyze in the morning.  No EKG changes does not a calcium   I reviewed the records were patient was seen by vascular surgery on 08/02/2020 for a hemodialysis access.  Patient does have a fistula now in her left arm  The patient is on the cardiac monitor to evaluate for evidence of arrhythmia and/or significant heart rate changes.      FINAL CLINICAL IMPRESSION(S) / ED DIAGNOSES   Final diagnoses:  ESRD (end stage renal disease) on dialysis (HCC)  Hyperkalemia  Elevated BUN  Acidosis  Closed fracture of left femur, unspecified fracture morphology, unspecified portion of femur, initial encounter ([place])     Rx / DC Orders   ED Discharge Orders     None        Note:  This document was prepared using Dragon voice recognition software and may include unintentional dictation errors.   Vanessa , MD 07/21/21 2041

## 2021-07-21 NOTE — Assessment & Plan Note (Addendum)
   Orthopedic service recommends no surgery   Orthopedic service recommends brace in flexion of the left knee, nonweightbearing  Patient to follow-up outpatient with orthopedic provider in 1 week after discharge  rehab/SNF placement

## 2021-07-21 NOTE — ED Notes (Signed)
This tech did a posterior and anterior short leg splint on pt.

## 2021-07-22 ENCOUNTER — Other Ambulatory Visit: Payer: Self-pay

## 2021-07-22 ENCOUNTER — Encounter: Payer: Self-pay | Admitting: Internal Medicine

## 2021-07-22 DIAGNOSIS — M8668 Other chronic osteomyelitis, other site: Secondary | ICD-10-CM | POA: Diagnosis present

## 2021-07-22 DIAGNOSIS — N186 End stage renal disease: Secondary | ICD-10-CM | POA: Diagnosis present

## 2021-07-22 DIAGNOSIS — E1165 Type 2 diabetes mellitus with hyperglycemia: Secondary | ICD-10-CM | POA: Diagnosis present

## 2021-07-22 DIAGNOSIS — E872 Acidosis, unspecified: Secondary | ICD-10-CM | POA: Insufficient documentation

## 2021-07-22 DIAGNOSIS — E11649 Type 2 diabetes mellitus with hypoglycemia without coma: Secondary | ICD-10-CM

## 2021-07-22 DIAGNOSIS — T82868A Thrombosis of vascular prosthetic devices, implants and grafts, initial encounter: Secondary | ICD-10-CM | POA: Diagnosis not present

## 2021-07-22 DIAGNOSIS — S72402A Unspecified fracture of lower end of left femur, initial encounter for closed fracture: Secondary | ICD-10-CM | POA: Diagnosis present

## 2021-07-22 DIAGNOSIS — G894 Chronic pain syndrome: Secondary | ICD-10-CM | POA: Diagnosis present

## 2021-07-22 DIAGNOSIS — E1122 Type 2 diabetes mellitus with diabetic chronic kidney disease: Secondary | ICD-10-CM | POA: Diagnosis present

## 2021-07-22 DIAGNOSIS — L02818 Cutaneous abscess of other sites: Secondary | ICD-10-CM | POA: Diagnosis not present

## 2021-07-22 DIAGNOSIS — Y831 Surgical operation with implant of artificial internal device as the cause of abnormal reaction of the patient, or of later complication, without mention of misadventure at the time of the procedure: Secondary | ICD-10-CM | POA: Diagnosis present

## 2021-07-22 DIAGNOSIS — E871 Hypo-osmolality and hyponatremia: Secondary | ICD-10-CM | POA: Diagnosis present

## 2021-07-22 DIAGNOSIS — L0291 Cutaneous abscess, unspecified: Secondary | ICD-10-CM | POA: Diagnosis not present

## 2021-07-22 DIAGNOSIS — F419 Anxiety disorder, unspecified: Secondary | ICD-10-CM | POA: Diagnosis present

## 2021-07-22 DIAGNOSIS — M009 Pyogenic arthritis, unspecified: Secondary | ICD-10-CM | POA: Diagnosis present

## 2021-07-22 DIAGNOSIS — S7292XA Unspecified fracture of left femur, initial encounter for closed fracture: Secondary | ICD-10-CM | POA: Diagnosis not present

## 2021-07-22 DIAGNOSIS — E875 Hyperkalemia: Secondary | ICD-10-CM | POA: Diagnosis present

## 2021-07-22 DIAGNOSIS — D631 Anemia in chronic kidney disease: Secondary | ICD-10-CM | POA: Diagnosis present

## 2021-07-22 DIAGNOSIS — N390 Urinary tract infection, site not specified: Secondary | ICD-10-CM | POA: Diagnosis not present

## 2021-07-22 DIAGNOSIS — F32A Depression, unspecified: Secondary | ICD-10-CM | POA: Diagnosis present

## 2021-07-22 DIAGNOSIS — I10 Essential (primary) hypertension: Secondary | ICD-10-CM | POA: Diagnosis not present

## 2021-07-22 DIAGNOSIS — W1830XA Fall on same level, unspecified, initial encounter: Secondary | ICD-10-CM | POA: Diagnosis present

## 2021-07-22 DIAGNOSIS — Z992 Dependence on renal dialysis: Secondary | ICD-10-CM | POA: Diagnosis not present

## 2021-07-22 DIAGNOSIS — M86452 Chronic osteomyelitis with draining sinus, left femur: Secondary | ICD-10-CM | POA: Diagnosis not present

## 2021-07-22 DIAGNOSIS — T8579XA Infection and inflammatory reaction due to other internal prosthetic devices, implants and grafts, initial encounter: Secondary | ICD-10-CM | POA: Diagnosis present

## 2021-07-22 DIAGNOSIS — S72352A Displaced comminuted fracture of shaft of left femur, initial encounter for closed fracture: Secondary | ICD-10-CM | POA: Diagnosis not present

## 2021-07-22 DIAGNOSIS — I120 Hypertensive chronic kidney disease with stage 5 chronic kidney disease or end stage renal disease: Secondary | ICD-10-CM | POA: Diagnosis present

## 2021-07-22 DIAGNOSIS — E1169 Type 2 diabetes mellitus with other specified complication: Secondary | ICD-10-CM | POA: Diagnosis present

## 2021-07-22 DIAGNOSIS — S72492A Other fracture of lower end of left femur, initial encounter for closed fracture: Secondary | ICD-10-CM | POA: Diagnosis present

## 2021-07-22 DIAGNOSIS — Z794 Long term (current) use of insulin: Secondary | ICD-10-CM | POA: Diagnosis not present

## 2021-07-22 DIAGNOSIS — L02416 Cutaneous abscess of left lower limb: Secondary | ICD-10-CM | POA: Diagnosis present

## 2021-07-22 DIAGNOSIS — J189 Pneumonia, unspecified organism: Secondary | ICD-10-CM | POA: Diagnosis not present

## 2021-07-22 DIAGNOSIS — N185 Chronic kidney disease, stage 5: Secondary | ICD-10-CM | POA: Diagnosis not present

## 2021-07-22 DIAGNOSIS — N2581 Secondary hyperparathyroidism of renal origin: Secondary | ICD-10-CM | POA: Diagnosis present

## 2021-07-22 DIAGNOSIS — E785 Hyperlipidemia, unspecified: Secondary | ICD-10-CM | POA: Diagnosis present

## 2021-07-22 DIAGNOSIS — M86252 Subacute osteomyelitis, left femur: Secondary | ICD-10-CM | POA: Diagnosis not present

## 2021-07-22 DIAGNOSIS — M00059 Staphylococcal arthritis, unspecified hip: Secondary | ICD-10-CM | POA: Diagnosis not present

## 2021-07-22 DIAGNOSIS — T847XXA Infection and inflammatory reaction due to other internal orthopedic prosthetic devices, implants and grafts, initial encounter: Secondary | ICD-10-CM | POA: Diagnosis not present

## 2021-07-22 DIAGNOSIS — T8452XA Infection and inflammatory reaction due to internal left hip prosthesis, initial encounter: Secondary | ICD-10-CM | POA: Diagnosis not present

## 2021-07-22 DIAGNOSIS — E669 Obesity, unspecified: Secondary | ICD-10-CM | POA: Diagnosis present

## 2021-07-22 DIAGNOSIS — Y95 Nosocomial condition: Secondary | ICD-10-CM | POA: Diagnosis present

## 2021-07-22 DIAGNOSIS — T82898A Other specified complication of vascular prosthetic devices, implants and grafts, initial encounter: Secondary | ICD-10-CM | POA: Diagnosis not present

## 2021-07-22 DIAGNOSIS — T847XXD Infection and inflammatory reaction due to other internal orthopedic prosthetic devices, implants and grafts, subsequent encounter: Secondary | ICD-10-CM | POA: Diagnosis not present

## 2021-07-22 DIAGNOSIS — E1129 Type 2 diabetes mellitus with other diabetic kidney complication: Secondary | ICD-10-CM | POA: Diagnosis not present

## 2021-07-22 LAB — CBC
HCT: 25.4 % — ABNORMAL LOW (ref 36.0–46.0)
Hemoglobin: 8.4 g/dL — ABNORMAL LOW (ref 12.0–15.0)
MCH: 29 pg (ref 26.0–34.0)
MCHC: 33.1 g/dL (ref 30.0–36.0)
MCV: 87.6 fL (ref 80.0–100.0)
Platelets: 256 10*3/uL (ref 150–400)
RBC: 2.9 MIL/uL — ABNORMAL LOW (ref 3.87–5.11)
RDW: 16.3 % — ABNORMAL HIGH (ref 11.5–15.5)
WBC: 19.1 10*3/uL — ABNORMAL HIGH (ref 4.0–10.5)
nRBC: 0 % (ref 0.0–0.2)

## 2021-07-22 LAB — BASIC METABOLIC PANEL
Anion gap: 27 — ABNORMAL HIGH (ref 5–15)
BUN: 156 mg/dL — ABNORMAL HIGH (ref 6–20)
CO2: 15 mmol/L — ABNORMAL LOW (ref 22–32)
Calcium: 8.1 mg/dL — ABNORMAL LOW (ref 8.9–10.3)
Chloride: 87 mmol/L — ABNORMAL LOW (ref 98–111)
Creatinine, Ser: 14.55 mg/dL — ABNORMAL HIGH (ref 0.44–1.00)
GFR, Estimated: 3 mL/min — ABNORMAL LOW (ref >60)
Glucose, Bld: 98 mg/dL (ref 70–99)
Potassium: 5.7 mmol/L — ABNORMAL HIGH (ref 3.5–5.1)
Sodium: 129 mmol/L — ABNORMAL LOW (ref 135–145)

## 2021-07-22 LAB — GLUCOSE, CAPILLARY
Glucose-Capillary: 68 mg/dL — ABNORMAL LOW (ref 70–99)
Glucose-Capillary: 75 mg/dL (ref 70–99)
Glucose-Capillary: 71 mg/dL (ref 70–99)
Glucose-Capillary: 98 mg/dL (ref 70–99)
Glucose-Capillary: 153 mg/dL — ABNORMAL HIGH (ref 70–99)

## 2021-07-22 LAB — HEPATITIS B SURFACE ANTIGEN: Hepatitis B Surface Ag: NONREACTIVE

## 2021-07-22 LAB — LACTIC ACID, PLASMA: Lactic Acid, Venous: 0.5 mmol/L (ref 0.5–1.9)

## 2021-07-22 LAB — HEMOGLOBIN A1C
Hgb A1c MFr Bld: 7.1 % — ABNORMAL HIGH (ref 4.8–5.6)
Mean Plasma Glucose: 157.07 mg/dL

## 2021-07-22 LAB — HIV ANTIBODY (ROUTINE TESTING W REFLEX): HIV Screen 4th Generation wRfx: NONREACTIVE

## 2021-07-22 MED ORDER — LIDOCAINE-PRILOCAINE 2.5-2.5 % EX CREA: 1.0000 "application " | TOPICAL_CREAM | CUTANEOUS | Status: DC | PRN

## 2021-07-22 MED ORDER — SODIUM CHLORIDE 0.9 % IV SOLN: 100.0000 mL | INTRAVENOUS | Status: DC | PRN

## 2021-07-22 MED ORDER — CHLORHEXIDINE GLUCONATE CLOTH 2 % EX PADS
6.0000 | MEDICATED_PAD | Freq: Every day | CUTANEOUS | Status: DC
  Administered 2021-07-23 – 2021-08-06 (×15): 6 via TOPICAL

## 2021-07-22 MED ORDER — ALTEPLASE 2 MG IJ SOLR: 2.0000 mg | Freq: Once | INTRAMUSCULAR | Status: DC | PRN

## 2021-07-22 MED ORDER — PENTAFLUOROPROP-TETRAFLUOROETH EX AERO: 1.0000 "application " | INHALATION_SPRAY | CUTANEOUS | Status: DC | PRN

## 2021-07-22 MED ORDER — LIDOCAINE HCL (PF) 1 % IJ SOLN
5.0000 mL | INTRAMUSCULAR | Status: DC | PRN
  Filled 2021-07-22: qty 5

## 2021-07-22 MED ORDER — CHLORHEXIDINE GLUCONATE CLOTH 2 % EX PADS: 6.0000 | MEDICATED_PAD | Freq: Every day | CUTANEOUS | Status: DC

## 2021-07-22 MED ORDER — HEPARIN SODIUM (PORCINE) 1000 UNIT/ML DIALYSIS: 1000.0000 [IU] | INTRAMUSCULAR | Status: DC | PRN

## 2021-07-22 MED ORDER — INSULIN ASPART 100 UNIT/ML IJ SOLN
0.0000 [IU] | Freq: Three times a day (TID) | INTRAMUSCULAR | Status: DC
  Administered 2021-07-23: 1 [IU] via SUBCUTANEOUS
  Administered 2021-07-23: 2 [IU] via SUBCUTANEOUS
  Administered 2021-07-23: 3 [IU] via SUBCUTANEOUS
  Administered 2021-07-24: 1 [IU] via SUBCUTANEOUS
  Administered 2021-07-24: 3 [IU] via SUBCUTANEOUS
  Administered 2021-07-25: 4 [IU] via SUBCUTANEOUS
  Administered 2021-07-25: 2 [IU] via SUBCUTANEOUS
  Administered 2021-07-25: 3 [IU] via SUBCUTANEOUS
  Administered 2021-07-26: 5 [IU] via SUBCUTANEOUS
  Administered 2021-07-26: 1 [IU] via SUBCUTANEOUS
  Administered 2021-07-26 – 2021-07-27 (×2): 3 [IU] via SUBCUTANEOUS
  Administered 2021-07-27: 5 [IU] via SUBCUTANEOUS
  Filled 2021-07-22 (×12): qty 1

## 2021-07-22 NOTE — Assessment & Plan Note (Addendum)
   Nephrology following.  Patient need a new confirmed seat before discharge to rehab

## 2021-07-22 NOTE — Assessment & Plan Note (Addendum)
   Amitriptyline 25 mg nightly, Celexa 20 mg daily, duloxetine 60 mg daily

## 2021-07-22 NOTE — Progress Notes (Signed)
Inpatient Rehab Admissions Coordinator:   Per PT recommendation pt was screened for CIR by Shann Medal, PT, DPT.  Pt appears to be an appropriate candidate, though note mobility significantly limited by pain during evaluation.  Would expect mobility to improve as pain control is achieved.  Will follow for next therapy session to ensure progress and determine candidacy at that time.   Shann Medal, PT, DPT Admissions Coordinator [phone] 07/22/21  3:25 PM

## 2021-07-22 NOTE — Progress Notes (Addendum)
0856 Report given to vanessa dialysis nurse at this time.  0911 BS 68. Pt given OJ and eating breakfast at this time. Nurse will recheck BS   [phone] Consent signed for hemodialysis at this time BS 75  0955 Pt transported down to dialysis at this time

## 2021-07-22 NOTE — Progress Notes (Signed)
   07/22/21 1100  Clinical Encounter Type  Visited With Patient  Visit Type Initial  Referral From Nurse   Chaplain responded to consult to give information on Advance Directive. Patient declined for now.

## 2021-07-22 NOTE — Plan of Care (Signed)
Patient admitted to unit from ED. Patient oriented to room.   Problem: Education: Goal: Knowledge of General Education information will improve Description: Including pain rating scale, medication(s)/side effects and non-pharmacologic comfort measures Outcome: Progressing   Problem: Health Behavior/Discharge Planning: Goal: Ability to manage health-related needs will improve Outcome: Progressing   Problem: Clinical Measurements: Goal: Ability to maintain clinical measurements within normal limits will improve Outcome: Progressing Goal: Will remain free from infection Outcome: Progressing Goal: Diagnostic test results will improve Outcome: Progressing Goal: Respiratory complications will improve Outcome: Progressing Goal: Cardiovascular complication will be avoided Outcome: Progressing   Problem: Activity: Goal: Risk for activity intolerance will decrease Outcome: Progressing   Problem: Nutrition: Goal: Adequate nutrition will be maintained Outcome: Progressing   Problem: Coping: Goal: Level of anxiety will decrease Outcome: Progressing   Problem: Elimination: Goal: Will not experience complications related to bowel motility Outcome: Progressing Goal: Will not experience complications related to urinary retention Outcome: Progressing   Problem: Pain Managment: Goal: General experience of comfort will improve Outcome: Progressing   Problem: Safety: Goal: Ability to remain free from injury will improve Outcome: Progressing   Problem: Skin Integrity: Goal: Risk for impaired skin integrity will decrease Outcome: Progressing

## 2021-07-22 NOTE — Progress Notes (Signed)
OT Cancellation Note  Patient Details Name: SYNDA BAGENT MRN: [id number] DOB: Jul 13, 1965   Cancelled Treatment:    Reason Eval/Treat Not Completed: Patient at procedure or test/ unavailable. Consult received, chart reviewed. Pt at dialysis. Will re-attempt OT evaluation at later date/time as pt is available and medically appropriate.   Ardeth Perfect., MPH, MS, OTR/L ascom [phone] 07/22/21, 1:04 PM

## 2021-07-22 NOTE — Assessment & Plan Note (Addendum)
-   Nicotine patch as needed for nicotine craving

## 2021-07-22 NOTE — Progress Notes (Signed)
Hemodialysis Post Treatment Note  Jul 22, 2021  Access: LUA AVF   Treatment Time: 3 Hours  UF Removed: 2-liter  Next Scheduled Treatment: TBD  NOTE:  Patient presents for 3-hour hemodialysis treatment, LUA AVF cannulates with difficult, venous pressures elevated, BFR reduced throughout course of treatment. Targeted UF reached, minimal post treatment bleeding. Patient offers up no concerns. No meds given, labs drawn per order. Patient returned to assigned room.

## 2021-07-22 NOTE — Consult Note (Signed)
[facility]  CONSULT NOTE    Date: 07/22/2021                  Patient Name:  [name]  MRN: [id number]  DOB: 07-05-1965  Age / Sex: 56 y.o., female         PCP: Julian Hy, PA-C                 Service Requesting Consult: [place]                 Reason for Consult: End-stage renal disease            History of Present Illness: [name] is a 56 y.o.  female with past medical history including hyperlipidemia, hypertension, left BKA, diabetes, chronic pain syndrome, and end-stage renal disease on hemodialysis, who was admitted to [facility] on 07/21/2021 for Acidosis [E87.20] Hyperkalemia [E87.5] Elevated BUN [R79.9] ESRD (end stage renal disease) on dialysis ([place]) [N18.6, Z99.2] Femur fracture, left ([place]) [S72.92XA] Closed fracture of left femur, unspecified fracture morphology, unspecified portion of femur, initial encounter ([place]) [S72.92XA] Closed fracture of left distal femur ([place]) [S72.402A]  Patient reports to the emergency department after sustaining fall 1 week prior while transferring from wheelchair to recliner.  Patient states pain from fall has progressively increased over the past week, causing her to follow-up with [place] today.  She receives outpatient dialysis treatments at [facility], followed by [facility].  Has not received dialysis in a week due to lack of transportation.  Patient is seen and evaluated during dialysis   HEMODIALYSIS FLOWSHEET:  Blood Flow Rate (mL/min): 300 mL/min Arterial Pressure (mmHg): -150 mmHg Venous Pressure (mmHg): 150 mmHg Transmembrane Pressure (mmHg): 80 mmHg Ultrafiltration Rate (mL/min): 840 mL/min Dialysate Flow Rate (mL/min): 500 ml/min Conductivity: Machine : 13.8 Conductivity: Machine : 13.8 Dialysis Fluid Bolus: Normal Saline Bolus Amount (mL): 150 mL  She is seen resting quietly.  No complaints of pain at this time.  Denies nausea, vomiting, or diarrhea.  Does  not recall any precipitating causes to fall.  Reports pain after fall limited her movement.  She was unable to find transportation to dialysis but also states she was not sure if she could tolerate sitting up for that length of time to receive treatment.  Denies fever, cough or chills.  Labs include sodium 127, potassium 6.1, bicarb 13, glucose 126, BUN 160, and creatinine 14.43 with GFR 3.  WBC 17.2, hemoglobin 9.0, and albumin 2.9.  Chest x-ray shows pulmonary hypoinflation and knee x-ray shows mildly displaced distal left femoral fracture.  Medications: Outpatient medications: Medications Prior to Admission  Medication Sig Dispense Refill Last Dose   alosetron (LOTRONEX) 1 MG tablet Take 1 mg by mouth 2 (two) times daily.   07/21/2021   amitriptyline (ELAVIL) 25 MG tablet Take 25 mg by mouth at bedtime.   07/20/2021   amLODipine (NORVASC) 5 MG tablet Take 1 tablet (5 mg total) by mouth daily. 30 tablet 0 07/20/2021   atorvastatin (LIPITOR) 40 MG tablet TAKE 1 TABLET (40 MG TOTAL) BY MOUTH DAILY AT 6 PM. 30 tablet 1 07/20/2021   Biotin 10 MG CAPS Take 1 capsule by mouth daily.   07/21/2021   carvedilol (COREG) 6.25 MG tablet Take 1 tablet (6.25 mg total) by mouth 2 (two) times daily. 60 tablet 0 07/21/2021   Cinnamon 500 MG capsule Take 500 mg by mouth daily.   07/21/2021   citalopram (CELEXA) 20 MG tablet  Take 1 tablet (20 mg total) by mouth daily. 30 tablet 0 07/21/2021   cyclobenzaprine (FLEXERIL) 10 MG tablet Take 1 tablet by mouth 3 (three) times daily as needed.   prn at prn   D3-50 1.25 MG (50000 UT) capsule Take 50,000 Units by mouth as directed. Monday, wed, Friday   Past Week   DEXILANT 60 MG capsule Take 60 mg by mouth daily.  6 07/21/2021   DULoxetine (CYMBALTA) 60 MG capsule Take 60 mg by mouth daily.   07/21/2021   hydrALAZINE (APRESOLINE) 100 MG tablet Take 1 tablet (100 mg total) by mouth 3 (three) times daily. 90 tablet 0 07/21/2021   insulin glargine (LANTUS) 100 UNIT/ML injection  Inject 0.12 mLs (12 Units total) into the skin at bedtime. 10 mL 11 07/20/2021   iron sucrose in sodium chloride 0.9 % 100 mL Iron Sucrose (Venofer)      Menthol-Methyl Salicylate (MUSCLE RUB) 10-15 % CREA Apply 1 application topically as needed for muscle pain.  0 prn at prn   NOVOLOG MIX 70/30 FLEXPEN (70-30) 100 UNIT/ML FlexPen SMARTSIG:10 Unit(s) SUB-Q 3 Times Daily   07/21/2021 at 1100   sevelamer carbonate (RENVELA) 800 MG tablet Take 2 tablets (1,600 mg total) by mouth 3 (three) times daily with meals. 180 tablet 0 07/21/2021   sodium chloride (OCEAN) 0.65 % SOLN nasal spray Place 1 spray into both nostrils as needed for congestion (nose irritation).  0 prn at prn   amitriptyline (ELAVIL) 10 MG tablet Take 1 tablet (10 mg total) by mouth at bedtime. (Patient not taking: Reported on 07/21/2021) 30 tablet 0 Not Taking   B Complex-C-Zn-Folic Acid (DIALYVITE 163 WITH ZINC) 0.8 MG TABS Take by mouth. (Patient not taking: Reported on 07/21/2021)   Not Taking   cyclobenzaprine (FLEXERIL) 5 MG tablet Take 1 tablet (5 mg total) by mouth 3 (three) times daily as needed for muscle spasms. 30 tablet 0    DULoxetine (CYMBALTA) 30 MG capsule Take 1 capsule (30 mg total) by mouth daily. 30 capsule 3    melatonin 300 MCG TABS Take 5 tablets (1,500 mcg total) by mouth at bedtime. (Patient not taking: Reported on 07/21/2021)  0 Not Taking   morphine (MS CONTIN) 15 MG 12 hr tablet Take 1 tablet (15 mg total) by mouth every 12 (twelve) hours. (Patient not taking: Reported on 07/21/2021) 14 tablet 0 Not Taking   naloxone (NARCAN) nasal spray 4 mg/0.1 mL 1 spray as directed. (Patient not taking: Reported on 07/21/2021)   Not Taking   oxyCODONE (OXY IR/ROXICODONE) 5 MG immediate release tablet Take 1-2 tablets (5-10 mg total) by mouth every 6 (six) hours as needed for severe pain. 28 tablet 0    traMADol (ULTRAM) 50 MG tablet Take 1 tablet (50 mg total) by mouth every 6 (six) hours as needed for moderate pain. (Patient not  taking: Reported on 07/21/2021) 28 tablet 0 Not Taking   Zolpidem Tartrate (AMBIEN PO)  (Patient not taking: Reported on 07/21/2021)   Not Taking    Current medications: Current Facility-Administered Medications  Medication Dose Route Frequency Provider Last Rate Last Admin   acetaminophen (TYLENOL) tablet 650 mg  650 mg Oral Q6H PRN Cox, Amy N, DO   650 mg at 07/22/21 0119   Or   acetaminophen (TYLENOL) suppository 650 mg  650 mg Rectal Q6H PRN Cox, Amy N, DO       amitriptyline (ELAVIL) tablet 25 mg  25 mg Oral QHS Cox, Amy N, DO  25 mg at 07/22/21 0119   amLODipine (NORVASC) tablet 5 mg  5 mg Oral Daily Cox, Amy N, DO       atorvastatin (LIPITOR) tablet 40 mg  40 mg Oral q1800 Cox, Amy N, DO       carvedilol (COREG) tablet 6.25 mg  6.25 mg Oral BID Cox, Amy N, DO       Chlorhexidine Gluconate Cloth 2 % PADS 6 each  6 each Topical Q0600 Colon Flattery, NP       citalopram (CELEXA) tablet 20 mg  20 mg Oral Daily Cox, Amy N, DO   20 mg at 07/22/21 6734   cyclobenzaprine (FLEXERIL) tablet 5 mg  5 mg Oral TID PRN Cox, Amy N, DO   5 mg at 07/22/21 0119   DULoxetine (CYMBALTA) DR capsule 60 mg  60 mg Oral Daily Cox, Amy N, DO   60 mg at 07/22/21 1937   fentaNYL (SUBLIMAZE) injection 25 mcg  25 mcg Intravenous Q3H PRN Cox, Amy N, DO       heparin injection 5,000 Units  5,000 Units Subcutaneous Q8H Cox, Amy N, DO   5,000 Units at 07/22/21 9024   hydrALAZINE (APRESOLINE) injection 5 mg  5 mg Intravenous Q6H PRN Cox, Amy N, DO       insulin aspart (novoLOG) injection 0-6 Units  0-6 Units Subcutaneous TID WC Sharen Hones, MD       morphine (PF) 2 MG/ML injection 2 mg  2 mg Intravenous Q4H PRN Cox, Amy N, DO   2 mg at 07/22/21 0745   nicotine (NICODERM CQ - dosed in mg/24 hours) patch 21 mg  21 mg Transdermal Daily PRN Cox, Amy N, DO       ondansetron (ZOFRAN) tablet 4 mg  4 mg Oral Q6H PRN Cox, Amy N, DO       Or   ondansetron (ZOFRAN) injection 4 mg  4 mg Intravenous Q6H PRN Cox, Amy N, DO        patiromer (VELTASSA) packet 8.4 g  8.4 g Oral Daily Cox, Amy N, DO   8.4 g at 07/21/21 2237   polyethylene glycol (MIRALAX / GLYCOLAX) packet 17 g  17 g Oral Daily PRN Cox, Amy N, DO       sevelamer carbonate (RENVELA) tablet 1,600 mg  1,600 mg Oral TID WC Cox, Amy N, DO   1,600 mg at 07/22/21 0745      Allergies: Allergies  Allergen Reactions   Trazodone Swelling   Latex Rash   Lidocaine Itching      Past Medical History: Past Medical History:  Diagnosis Date   Anxiety 2002   Chest tightness    Depression 2001   Diabetes type 1, uncontrolled    at age 34   Diabetic neuropathy ([place])    Essential hypertension 2015   GERD (gastroesophageal reflux disease)    about age of 47   Nausea and vomiting in adult    Stroke [facility])    Urinary frequency    Yeast vaginitis      Past Surgical History: Past Surgical History:  Procedure Laterality Date   ACHILLES TENDON SURGERY Left 03/10/2013   Procedure: LEFT CHOPART AMPUTATION/ LEFT TENDON ACHILLES [place];  Surgeon: Wylene Simmer, MD;  Location: [place];  Service: Orthopedics;  Laterality: Left;   AMPUTATION Left 06/15/2012   Procedure: AMPUTATION DIGIT;  Surgeon: Meredith Pel, MD;  Location: [place];  Service: Orthopedics;  Laterality: Left;  Left great toe revision amputation  AMPUTATION Left 01/12/2020   Procedure: AMPUTATION BELOW KNEE;  Surgeon: Wylene Simmer, MD;  Location: [place];  Service: Orthopedics;  Laterality: Left;   AV FISTULA PLACEMENT Left 01/09/2020   Procedure: LEFT UPPER ARM ARTERIOVENOUS (AV) FISTULA CREATION;  Surgeon: Cherre Robins, MD;  Location: [place];  Service: Vascular;  Laterality: Left;   ENDOMETRIAL ABLATION     I & D EXTREMITY Left 01/19/2020   Procedure: IRRIGATION AND DEBRIDEMENT Left Hip;  Surgeon: Rod Can, MD;  Location: [place];  Service: Orthopedics;  Laterality: Left;   INSERTION OF DIALYSIS CATHETER Right 01/09/2020   Procedure: INSERTION OF DIALYSIS CATHETER;  Surgeon: Cherre Robins,  MD;  Location: [place];  Service: Vascular;  Laterality: Right;   INTRAMEDULLARY (IM) NAIL INTERTROCHANTERIC Left 12/29/2019   Procedure: INTRAMEDULLARY (IM) NAIL INTERTROCHANTRIC;  Surgeon: Rod Can, MD;  Location: [facility];  Service: Orthopedics;  Laterality: Left;   IR ANGIOGRAM EXTREMITY LEFT  12/17/2018   IR FEM POP ART ATHERECT INC PTA MOD SED  12/17/2018   IR RADIOLOGIST EVAL & MGMT  11/30/2018   IR RADIOLOGIST EVAL & MGMT  12/09/2018   IR RADIOLOGIST EVAL & MGMT  02/08/2019   IR US GUIDE VASC ACCESS RIGHT  12/17/2018   LAPAROSCOPIC CHOLECYSTECTOMY  01/30/2015   Cone day surgery      Family History: Family History  Problem Relation Age of Onset   Diabetes Mother    Heart attack Mother    Heart disease Mother        before age 64   Hypertension Mother    Hyperlipidemia Mother    Diabetes Father    Heart attack Father    Heart disease Father        before age 96   Diabetes Sister    Hypertension Sister      Social History: Social History   Socioeconomic History   Marital status: Divorced    Spouse name: Not on file   Number of children: 1   Years of education: Not on file   Highest education level: Not on file  Occupational History    Employer: [employer]  Tobacco Use   Smoking status: Light Smoker    Packs/day: 0.25    Years: 22.00    Pack years: 5.50    Types: Cigarettes   Smokeless tobacco: Never  Vaping Use   Vaping Use: Never used  Substance and Sexual Activity   Alcohol use: Yes    Alcohol/week: 0.0 standard drinks    Comment: sociial   Drug use: No   Sexual activity: Yes    Partners: Male  Other Topics Concern   Not on file  Social History Narrative   Lives at home with her daughter   Right handed   Caffeine: 3 cups daily   Social Determinants of Health   Financial Resource Strain: Not on file  Food Insecurity: Not on file  Transportation Needs: Not on file  Physical Activity: Not on file  Stress: Not on file  Social Connections: Not on  file  Intimate Partner Violence: Not on file     Review of Systems: Review of Systems  Constitutional:  Negative for chills, fever and malaise/fatigue.  HENT:  Negative for congestion, sore throat and tinnitus.   Eyes:  Negative for blurred vision and redness.  Respiratory:  Negative for cough, shortness of breath and wheezing.   Cardiovascular:  Negative for chest pain, palpitations, claudication and leg swelling.  Gastrointestinal:  Negative for abdominal pain,  blood in stool, diarrhea, nausea and vomiting.  Genitourinary:  Negative for flank pain, frequency and hematuria.  Musculoskeletal:  Positive for falls. Negative for back pain and myalgias.  Skin:  Negative for rash.  Neurological:  Negative for dizziness, weakness and headaches.  Endo/Heme/Allergies:  Does not bruise/bleed easily.  Psychiatric/Behavioral:  Negative for depression. The patient is not nervous/anxious and does not have insomnia.    Vital Signs: Blood pressure 128/65, pulse 98, temperature 98.3 F (36.8 C), resp. rate 16, height 5\' 10"  (1.778 m), weight 83.2 kg, SpO2 96 %.  Weight trends: Filed Weights   07/21/21 1722 07/22/21 1007 07/22/21 1327  Weight: 77.1 kg 84 kg 83.2 kg    Physical Exam: General: NAD, resting comfortably  Head: Normocephalic, atraumatic. Moist oral mucosal membranes  Eyes: Anicteric  Lungs:  Clear to auscultation, normal effort  Heart: Regular rate and rhythm  Abdomen:  Soft, nontender  Extremities: 1+ peripheral edema.  Neurologic: Nonfocal, moving all four extremities  Skin: No lesions  Access: Left aVF     Lab results: Basic Metabolic Panel: Recent Labs  Lab 07/21/21 1729 07/22/21 0256  NA 127* 129*  K 6.1* 5.7*  CL 89* 87*  CO2 13* 15*  GLUCOSE 126* 98  BUN 160* 156*  CREATININE 14.43* 14.55*  CALCIUM 8.3* 8.1*    Liver Function Tests: Recent Labs  Lab 07/21/21 1729  AST 12*  ALT 8  ALKPHOS 192*  BILITOT 1.3*  PROT 7.7  ALBUMIN 2.9*   No results  for input(s): LIPASE, AMYLASE in the last 168 hours. No results for input(s): AMMONIA in the last 168 hours.  CBC: Recent Labs  Lab 07/21/21 1729 07/22/21 0256  WBC 17.2* 19.1*  HGB 9.0* 8.4*  HCT 28.0* 25.4*  MCV 89.7 87.6  PLT 277 256    Cardiac Enzymes: No results for input(s): CKTOTAL, CKMB, CKMBINDEX, TROPONINI in the last 168 hours.  BNP: Invalid input(s): POCBNP  CBG: Recent Labs  Lab 07/21/21 2146 07/21/21 2155 07/22/21 0910 07/22/21 0930  GLUCAP >600* 234* 68* 75    Microbiology: Results for orders placed or performed during the hospital encounter of 01/23/20  Aerobic/Anaerobic Culture (surgical/deep wound)     Status: None   Collection Time: 02/10/20 10:48 AM   Specimen: Fluid; Abscess  Result Value Ref Range Status   Specimen Description FLUID SYNOVIAL HIP  Final   Special Requests Normal  Final   Gram Stain   Final    ABUNDANT WBC PRESENT, PREDOMINANTLY PMN NO ORGANISMS SEEN    Culture   Final    No growth aerobically or anaerobically. Performed at [facility], [place] [address], [place], [place] [address]    Report Status 02/15/2020 FINAL  Final    Coagulation Studies: No results for input(s): LABPROT, INR in the last 72 hours.  Urinalysis: No results for input(s): COLORURINE, LABSPEC, PHURINE, GLUCOSEU, HGBUR, BILIRUBINUR, KETONESUR, PROTEINUR, UROBILINOGEN, NITRITE, LEUKOCYTESUR in the last 72 hours.  Invalid input(s): APPERANCEUR    Imaging: DG Chest Port 1 View  Result Date: 07/21/2021 CLINICAL DATA:  Femoral fracture. Medical clearance for surgical repair. EXAM: PORTABLE CHEST 1 VIEW COMPARISON:  None Available. FINDINGS: Lung volumes are small, but are symmetric. Interstitial opacity at the left lung base may represent atelectasis or infiltrate. No pneumothorax or pleural effusion. Cardiac size within normal limits. No acute bone abnormality. IMPRESSION: Pulmonary hypoinflation.  Left basilar atelectasis or infiltrate.  Electronically Signed   By: Fidela Salisbury M.D.   On: 07/21/2021 21:15  DG Knee Complete 4 Views Left  Result Date: 07/21/2021 CLINICAL DATA:  Fall, left knee pain EXAM: LEFT KNEE - COMPLETE 4+ VIEW COMPARISON:  None Available. FINDINGS: Prior below the knee amputation. There is a comminuted fracture in the distal left femoral metaphysis. Mildly displaced fracture fragments anteriorly. Diffuse osteopenia. IMPRESSION: Prior below-knee amputation. Comminuted, mildly displaced distal left femoral fracture. Electronically Signed   By: Rolm Baptise M.D.   On: 07/21/2021 18:25     Assessment & Plan: [name] is a 56 y.o.  female with past medical history including hyperlipidemia, hypertension, left BKA, diabetes, chronic pain syndrome, and end-stage renal disease on hemodialysis, who was admitted to [facility] on 07/21/2021 for Acidosis [E87.20] Hyperkalemia [E87.5] Elevated BUN [R79.9] ESRD (end stage renal disease) on dialysis ([place]) [N18.6, Z99.2] Femur fracture, left ([facility]) [S72.92XA] Closed fracture of left femur, unspecified fracture morphology, unspecified portion of femur, initial encounter ([place]) [S72.92XA] Closed fracture of left distal femur ([place]) [S72.402A]  CK [facility]/MWF/left aVF/83 kg  End-stage renal disease on this.  Will maintain outpatient schedule if possible.  Patient scheduled to receive dialysis today, UF 2 L as tolerated.  Next treatment scheduled for Wednesday.  2. Anemia of chronic kidney disease Normocytic Lab Results  Component Value Date   HGB 8.4 (L) 07/22/2021  Mircera received outpatient Hemoglobin slightly decreased, will continue to monitor.  3. Secondary Hyperparathyroidism:   Lab Results  Component Value Date   PTH 44 01/11/2020   PTH Comment 01/11/2020   CALCIUM 8.1 (L) 07/22/2021   CAION 1.10 (L) 04/07/2017   PHOS 5.1 (H) 02/20/2020    Calcium and phosphorus remain within acceptable range.  Continue sevelamer with meals  4.  Hypertension  with chronic kidney disease.  Home regimen includes amlodipine, carvedilol and hydralazine.  Currently receiving these medications.  5. Diabetes mellitus type II with chronic kidney disease: insulin dependent. Home regimen includes to send NovoLog 70/30. Most recent hemoglobin A1c is 6.2 on 12/29/19.   Primary team to manage SSI thank you   LOS: 0 Gabrian Hoque 5/22/20233:03 PM

## 2021-07-22 NOTE — Evaluation (Signed)
Physical Therapy Evaluation Patient Details Name: [name] MRN: [id number] DOB: [birth year] Today's Date: 07/22/2021  History of Present Illness  Pt is a 56 y/o F admitted on 07/21/21 after presenting to ED for c/c of pain from falling during transfer from w/c to recliner ~1 week ago & pt missed dialysis for a whole week. Pt found to ahve distal L femur fx & deemed no surgery by orthopedics but NWB LLE. PMH: HTN, HLD, ESRD on HD MWF, L BKA in 2021, depression, anxiety, insulin-dependent DM, insomnia, chronic pain syndromes  Clinical Impression  Pt seen for PT evaluation with pt reporting prior to admission she was mod I with transfers to/from w/c, not using prosthesis as she has not been trained on how to use it yet, and was mod I from w/c level. Shortly after session started pt reported need to have BM so PT encouraged & attempted to assist pt with bed mobility to transfer to [facility] but pt c/o significant LLE pain when PT attempted to assist with supine>sit & PT unable to scoot pt to EOB even with using chuck pad. Pt requires mod assist to roll R with use of bed rails & technique but pt limited by LLE pain. Pt left on bedpan with call bell in reach - nurse notified of c/o pain & need for significant assistance with mobility.     Recommendations for follow up therapy are one component of a multi-disciplinary discharge planning process, led by the attending physician.  Recommendations may be updated based on patient status, additional functional criteria and insurance authorization.  Follow Up Recommendations Acute inpatient rehab (3hours/day)    Assistance Recommended at Discharge Frequent or constant Supervision/Assistance  Patient can return home with the following  Two people to help with walking and/or transfers;Two people to help with bathing/dressing/bathroom;Help with stairs or ramp for entrance;Assist for transportation;Assistance with cooking/housework    Equipment Recommendations  None recommended by PT (TBD in next venue)  Recommendations for Other Services       Functional Status Assessment Patient has had a recent decline in their functional status and demonstrates the ability to make significant improvements in function in a reasonable and predictable amount of time.     Precautions / Restrictions Precautions Precautions: Fall Restrictions Weight Bearing Restrictions: Yes LLE Weight Bearing: Non weight bearing Other Position/Activity Restrictions: pt also with chronic L BKA      Mobility  Bed Mobility Overal bed mobility: Needs Assistance Bed Mobility: Rolling Rolling: Mod assist (to roll R; PT provides cuing to increase ease of mobility but pt unable to cross LLE over RLE 2/2 pain, cuing to use bed rails)              Transfers                        Ambulation/Gait                  Stairs            Wheelchair Mobility    Modified Rankin (Stroke Patients Only)       Balance                                             Pertinent Vitals/Pain Pain Assessment Pain Assessment: Faces Faces Pain Scale: Hurts whole lot Pain Location: LLE Pain Descriptors /  Indicators: Grimacing, Discomfort Pain Intervention(s): Monitored during session (notified nurse)    Home Living Family/patient expects to be discharged to:: Private residence Living Arrangements: Children Available Help at Discharge: Family;Available PRN/intermittently Type of Home: Mobile home Home Access: Ramped entrance       Home Layout: One level Home Equipment: Wheelchair - manual      Prior Function Prior Level of Function : Independent/Modified Independent             Mobility Comments: Pt reports she's independent with transfers without AD (via squat pivot or lateral scoot), mod I from w/c level, has received her LLE prosthesis but hasn't been trained/received PT on how to use it yet. Daughter assists with  transportation to dialysis appointments.       Hand Dominance        Extremity/Trunk Assessment   Upper Extremity Assessment Upper Extremity Assessment: Generalized weakness    Lower Extremity Assessment Lower Extremity Assessment: Generalized weakness (pt with c/o significant LLE pain, L knee in flexed position throughout session)       Communication      Cognition Arousal/Alertness: Awake/alert Behavior During Therapy: WFL for tasks assessed/performed Overall Cognitive Status: No family/caregiver present to determine baseline cognitive functioning                                 General Comments: Pt with poor awareness as pt unable to roll in bed without assistance but requests toilet paper to attempt peri hygiene on her own with PT educating on inability to perform peri care if she cannot even roll to get into position without assistance.        General Comments      Exercises     Assessment/Plan    PT Assessment Patient needs continued PT services  PT Problem List Decreased strength;Pain;Decreased activity tolerance;Decreased balance;Decreased mobility;Decreased knowledge of precautions;Decreased safety awareness       PT Treatment Interventions DME instruction;Therapeutic exercise;Balance training;Wheelchair mobility training;Neuromuscular re-education;Functional mobility training;Therapeutic activities;Patient/family education;Modalities;Manual techniques    PT Goals (Current goals can be found in the Care Plan section)  Acute Rehab PT Goals Patient Stated Goal: decreased pain PT Goal Formulation: With patient Time For Goal Achievement: 08/05/21 Potential to Achieve Goals: Good    Frequency 7X/week     Co-evaluation               AM-PAC PT "6 Clicks" Mobility  Outcome Measure Help needed turning from your back to your side while in a flat bed without using bedrails?: A Lot Help needed moving from lying on your back to sitting on  the side of a flat bed without using bedrails?: Total Help needed moving to and from a bed to a chair (including a wheelchair)?: Total Help needed standing up from a chair using your arms (e.g., wheelchair or bedside chair)?: Total Help needed to walk in hospital room?: Total Help needed climbing 3-5 steps with a railing? : Total 6 Click Score: 7    End of Session   Activity Tolerance: Patient limited by pain (limited by need to use bed pan) Patient left: in bed;with bed alarm set;with call bell/phone within reach Nurse Communication: Mobility status;Patient requests pain meds PT Visit Diagnosis: Difficulty in walking, not elsewhere classified (R26.2);Other abnormalities of gait and mobility (R26.89);Muscle weakness (generalized) (M62.81);Other (comment);Pain Pain - Right/Left: Left Pain - part of body: Leg    Time: 9678-9381 PT Time Calculation (min) (ACUTE  ONLY): 10 min   Charges:   PT Evaluation $PT Eval Moderate Complexity: [place], PT, DPT 07/22/21, 2:42 PM   Waunita Schooner 07/22/2021, 2:40 PM

## 2021-07-22 NOTE — Assessment & Plan Note (Addendum)
Resolved  Monitor bmp

## 2021-07-22 NOTE — Progress Notes (Signed)
  Progress Note   Patient: [name] ZOX:[id number] DOB: [birth year] DOA: 07/21/2021     0 DOS: the patient was seen and examined on 07/22/2021   Brief hospital course: [name] is a 56 year old female with history of hypertension, hyperlipidemia, end-stage renal disease on HD MWF, history of left BKA in 2021, depression, anxiety, insulin-dependent diabetes mellitus, insomnia, chronic pain syndrome, who presents emergency department for chief concerns of pain from falling during transfer from wheelchair to recliner about 1 week ago. She missed dialysis for whole week.  Upon arriving the hospital, she was found to have a distal left femur fracture, deemed no surgery by orthopedics.  But no weightbearing.  Patient is also started on dialysis.  Assessment and Plan: Closed fracture of the left distal femur. Mechanical fall. Patient did not have any syncope.  Per orthopedics, patient does not need surgery at this time, but she is nonweightbearing. We will obtain PT/OT.  End-stage renal Disease on dialysis. Hyperkalemia. Hyponatremia. Metabolic acidosis Patient is receiving hemodialysis, followed by nephrology.  Type 2 diabetes with hypoglycemia. Patient has significant hypoglycemia, will discontinue long-acting insulin.  Add sensitive sliding scale.  Restart insulin glargine tomorrow if glucose is no longer low.  Essential hypertension. Continue amlodipine and Coreg      Subjective:  Patient is complaining of pain in her knee. Denies any short of breath or cough.  Physical Exam: Vitals:   07/22/21 1115 07/22/21 1130 07/22/21 1145 07/22/21 1200  BP: (!) 108/49 (!) 110/53 (!) 93/50 (!) 105/51  Pulse: 87 91 93 96  Resp: 13 18 19 15   Temp:      TempSrc:      SpO2: 95% 96% 96% 98%  Weight:      Height:       General exam: Appears calm and comfortable  Respiratory system: Clear to auscultation. Respiratory effort normal. Cardiovascular system: S1 & S2 heard, RRR. No  JVD, murmurs, rubs, gallops or clicks. No pedal edema. Gastrointestinal system: Abdomen is nondistended, soft and nontender. No organomegaly or masses felt. Normal bowel sounds heard. Central nervous system: Alert and oriented. No focal neurological deficits. Extremities: Symmetric 5 x 5 power. Skin: No rashes, lesions or ulcers Psychiatry: Judgement and insight appear normal. Mood & affect appropriate.   Data Reviewed:  Reviewed all lab results.  X-rays.  Family Communication: Not able to reach daughter  Disposition: Status is: Observation The patient will require care spanning > 2 midnights and should be moved to inpatient because: Severity of disease.  Planned Discharge Destination:  Pending    Time spent: 36 minutes  Author: Sharen Hones, MD 07/22/2021 12:25 PM  For on call review [email or web address].

## 2021-07-22 NOTE — Assessment & Plan Note (Addendum)
   Amlodipine 5 mg daily, carvedilol 6.25 mg p.o. twice daily  Hydralazine 5 mg IV every 6 hours.  For SBP greater than 180, 3 doses ordered

## 2021-07-22 NOTE — Progress Notes (Signed)
Hemodialysis patient known at [facility] MWF 11am, patient stated that either she or daughter transports to treatments.Patient stated no dialysis concerns.

## 2021-07-23 DIAGNOSIS — S72492A Other fracture of lower end of left femur, initial encounter for closed fracture: Secondary | ICD-10-CM | POA: Diagnosis not present

## 2021-07-23 DIAGNOSIS — I10 Essential (primary) hypertension: Secondary | ICD-10-CM | POA: Diagnosis not present

## 2021-07-23 DIAGNOSIS — Z992 Dependence on renal dialysis: Secondary | ICD-10-CM | POA: Diagnosis not present

## 2021-07-23 DIAGNOSIS — N186 End stage renal disease: Secondary | ICD-10-CM | POA: Diagnosis not present

## 2021-07-23 LAB — BASIC METABOLIC PANEL
Anion gap: 17 — ABNORMAL HIGH (ref 5–15)
BUN: 89 mg/dL — ABNORMAL HIGH (ref 6–20)
CO2: 23 mmol/L (ref 22–32)
Calcium: 8.3 mg/dL — ABNORMAL LOW (ref 8.9–10.3)
Chloride: 92 mmol/L — ABNORMAL LOW (ref 98–111)
Creatinine, Ser: 9.2 mg/dL — ABNORMAL HIGH (ref 0.44–1.00)
GFR, Estimated: 5 mL/min — ABNORMAL LOW (ref >60)
Glucose, Bld: 210 mg/dL — ABNORMAL HIGH (ref 70–99)
Potassium: 4.4 mmol/L (ref 3.5–5.1)
Sodium: 132 mmol/L — ABNORMAL LOW (ref 135–145)

## 2021-07-23 LAB — CBC
HCT: 24.2 % — ABNORMAL LOW (ref 36.0–46.0)
Hemoglobin: 7.8 g/dL — ABNORMAL LOW (ref 12.0–15.0)
MCH: 28.5 pg (ref 26.0–34.0)
MCHC: 32.2 g/dL (ref 30.0–36.0)
MCV: 88.3 fL (ref 80.0–100.0)
Platelets: 249 10*3/uL (ref 150–400)
RBC: 2.74 MIL/uL — ABNORMAL LOW (ref 3.87–5.11)
RDW: 17.2 % — ABNORMAL HIGH (ref 11.5–15.5)
WBC: 13.4 10*3/uL — ABNORMAL HIGH (ref 4.0–10.5)
nRBC: 0 % (ref 0.0–0.2)

## 2021-07-23 LAB — GLUCOSE, CAPILLARY
Glucose-Capillary: 168 mg/dL — ABNORMAL HIGH (ref 70–99)
Glucose-Capillary: 244 mg/dL — ABNORMAL HIGH (ref 70–99)
Glucose-Capillary: 267 mg/dL — ABNORMAL HIGH (ref 70–99)
Glucose-Capillary: 215 mg/dL — ABNORMAL HIGH (ref 70–99)

## 2021-07-23 MED ORDER — INSULIN GLARGINE-YFGN 100 UNIT/ML ~~LOC~~ SOLN
6.0000 [IU] | Freq: Every day | SUBCUTANEOUS | Status: DC
  Administered 2021-07-23 – 2021-07-26 (×4): 6 [IU] via SUBCUTANEOUS
  Filled 2021-07-23 (×5): qty 0.06

## 2021-07-23 MED ORDER — PANTOPRAZOLE SODIUM 40 MG PO TBEC
40.0000 mg | DELAYED_RELEASE_TABLET | Freq: Every day | ORAL | Status: DC
  Administered 2021-07-23 – 2021-08-07 (×15): 40 mg via ORAL
  Filled 2021-07-23 (×16): qty 1

## 2021-07-23 MED ORDER — HYDROMORPHONE HCL 1 MG/ML IJ SOLN
0.5000 mg | INTRAMUSCULAR | Status: DC | PRN
  Administered 2021-07-23 – 2021-08-04 (×16): 0.5 mg via INTRAVENOUS
  Filled 2021-07-23 (×16): qty 1

## 2021-07-23 MED ORDER — OXYCODONE-ACETAMINOPHEN 7.5-325 MG PO TABS
1.0000 | ORAL_TABLET | ORAL | Status: DC | PRN
  Administered 2021-07-23 – 2021-08-07 (×51): 1 via ORAL
  Filled 2021-07-23 (×53): qty 1

## 2021-07-23 NOTE — Evaluation (Signed)
Occupational Therapy Evaluation Patient Details Name: [name] MRN: [id number] DOB: 1965/03/30 Today's Date: 07/23/2021   History of Present Illness Pt is a 56 y/o F admitted on 07/21/21 after presenting to ED for c/c of pain from falling during transfer from w/c to recliner ~1 week ago & pt missed dialysis for a whole week. Pt found to ahve distal L femur fx & deemed no surgery by orthopedics but NWB LLE. PMH: HTN, HLD, ESRD on HD MWF, L BKA in 2021, depression, anxiety, insulin-dependent DM, insomnia, chronic pain syndromes   Clinical Impression   Patient presenting with decreased ind in self care, balance, functional mobility/transfers, and endurance. Pt reports being mod I at wheelchair level at baseline. She has prosthetic that she reports not having training on. She stands with rollator and transfers to wheelchair per her report. She then performs all mobility and self care tasks from wheelchair level. Pt is limited by pain and refuses EOB and OOB tasks. Pt crying out with movement in bed and needs increased assistance for bed mobility. Patient will benefit from acute OT to increase overall independence in the areas of ADLs, functional mobility, and safety awareness in order to safely discharge to next venue of care.      Recommendations for follow up therapy are one component of a multi-disciplinary discharge planning process, led by the attending physician.  Recommendations may be updated based on patient status, additional functional criteria and insurance authorization.   Follow Up Recommendations  Skilled nursing-short term rehab (<3 hours/day)    Assistance Recommended at Discharge Intermittent Supervision/Assistance  Patient can return home with the following A lot of help with walking and/or transfers;A lot of help with bathing/dressing/bathroom;Help with stairs or ramp for entrance;Assistance with cooking/housework    Functional Status Assessment  Patient has had a recent  decline in their functional status and demonstrates the ability to make significant improvements in function in a reasonable and predictable amount of time.  Equipment Recommendations  Other (comment) (defer to next venue of care)       Precautions / Restrictions Precautions Precautions: Fall Restrictions Weight Bearing Restrictions: Yes LLE Weight Bearing: Non weight bearing Other Position/Activity Restrictions: pt also with chronic L BKA      Mobility Bed Mobility Overal bed mobility: Needs Assistance Bed Mobility: Rolling Rolling: Max assist         General bed mobility comments: refusal for supine <>sit    Transfers                   General transfer comment: Pt refusal to attempt          ADL either performed or assessed with clinical judgement   ADL Overall ADL's : Needs assistance/impaired Eating/Feeding: Set up;Bed level   Grooming: Wash/dry hands;Wash/dry face;Bed level;Set up                                 General ADL Comments: anticipate max A for LB self care and set up A - min A for UB self care bed level     Vision Patient Visual Report: No change from baseline              Pertinent Vitals/Pain Pain Assessment Pain Assessment: 0-10 Pain Score: 9  Pain Location: L LE Pain Descriptors / Indicators: Discomfort Pain Intervention(s): Monitored during session, Patient requesting pain meds-RN notified        Extremity/Trunk Assessment  Upper Extremity Assessment Upper Extremity Assessment: Generalized weakness   Lower Extremity Assessment Lower Extremity Assessment: Generalized weakness       Communication Communication Communication: No difficulties   Cognition Arousal/Alertness: Awake/alert Behavior During Therapy: WFL for tasks assessed/performed Overall Cognitive Status: No family/caregiver present to determine baseline cognitive functioning                                 General Comments:  poor awareness and insight to deficits                Home Living Family/patient expects to be discharged to:: Private residence Living Arrangements: Children Available Help at Discharge: Family;Available PRN/intermittently Type of Home: Mobile home Home Access: Ramped entrance     Home Layout: One level     Bathroom Shower/Tub: Occupational psychologist: Standard     Home Equipment: Wheelchair - manual;BSC/3in1;Rollator (4 wheels)          Prior Functioning/Environment Prior Level of Function : Independent/Modified Independent             Mobility Comments: Pt reports she's independent with transfers without AD (via squat pivot or lateral scoot), mod I from w/c level, has received her LLE prosthesis but hasn't been trained/received PT on how to use it yet. Daughter assists with transportation to dialysis appointments. ADLs Comments: Pt reports performing sink/basin baths from wheelchair for bathing and dressing without assistance. Family assists with IADLs        OT Problem List: Decreased strength;Pain;Decreased range of motion;Decreased activity tolerance;Decreased safety awareness;Impaired balance (sitting and/or standing);Decreased knowledge of use of DME or AE;Decreased knowledge of precautions      OT Treatment/Interventions: Self-care/ADL training;Balance training;Therapeutic exercise;Energy conservation;Therapeutic activities;DME and/or AE instruction;Visual/perceptual remediation/compensation;Patient/family education    OT Goals(Current goals can be found in the care plan section) Acute Rehab OT Goals Patient Stated Goal: to go home OT Goal Formulation: With patient Time For Goal Achievement: 08/06/21 Potential to Achieve Goals: Fair ADL Goals Pt Will Perform Grooming: with modified independence;sitting Pt Will Perform Lower Body Dressing: with min assist;sitting/lateral leans Pt Will Transfer to Toilet: with min assist Pt Will Perform  Toileting - Clothing Manipulation and hygiene: with min assist Pt/caregiver will Perform Home Exercise Program: Both right and left upper extremity;With theraband;Increased strength;With written HEP provided;Independently  OT Frequency: Min 2X/week       AM-PAC OT "6 Clicks" Daily Activity     Outcome Measure Help from another person eating meals?: None Help from another person taking care of personal grooming?: A Little Help from another person toileting, which includes using toliet, bedpan, or urinal?: Total Help from another person bathing (including washing, rinsing, drying)?: A Lot Help from another person to put on and taking off regular upper body clothing?: A Little Help from another person to put on and taking off regular lower body clothing?: Total 6 Click Score: 14   End of Session    Activity Tolerance: Patient limited by pain Patient left: in bed;with call bell/phone within reach;with bed alarm set  OT Visit Diagnosis: Muscle weakness (generalized) (M62.81);Unsteadiness on feet (R26.81);History of falling (Z91.81)                Time: 6270-3500 OT Time Calculation (min): 14 min Charges:  OT General Charges $OT Visit: 1 Visit OT Evaluation $OT Eval Moderate Complexity: 1 [address] [place], OTR/L , CBIS ascom [phone]  07/23/21, 12:32 PM

## 2021-07-23 NOTE — Progress Notes (Signed)
Inpatient Rehab Admissions Coordinator:   Reviewed today's therapy session notes.  Do not feel pt would be able to tolerate intensity of CIR program at this time.  I'm happy to follow from the periphery while she remains in the acute setting, but if she is medically optimized for d/c at this time I would agree with OT recommendations for SNF.    Shann Medal, PT, DPT Admissions Coordinator [phone] 07/23/21  2:03 PM

## 2021-07-23 NOTE — [place] FL2 (Addendum)
[place] MEDICAID FL2 LEVEL OF CARE SCREENING TOOL     IDENTIFICATION  Patient Name: [name] Birthdate: Oct 08, 1965 Sex: female Admission Date (Current Location): 07/21/2021  [facility] and [place] Number:  Chiropodist and Address:  [facility], [address], [place], [place] [address]      Provider Number: [id number]  Attending Physician Name and Address:  Marrion Coy, MD  Relative Name and Phone Number:  Yvonna Alanis daughter [phone]    Current Level of Care:  Hosptial  Recommended Level of Care: Skilled Nursing Facility Prior Approval Number:    Date Approved/Denied:   PASRR Number: pending  Discharge Plan: SNF    Current Diagnoses: Patient Active Problem List   Diagnosis Date Noted   Hyperkalemia 07/22/2021   Metabolic acidemia 07/22/2021   Closed fracture of left distal femur (HCC) 07/22/2021   Uncontrolled type 2 diabetes mellitus with hypoglycemia, with long-term current use of insulin (HCC) 07/22/2021   Femur fracture, left (HCC) 07/21/2021   Infection of superficial incisional surgical site after procedure 03/09/2020   Abscess    Hypoglycemia    Leukocytosis    Essential hypertension    Bacteremia    Sleep disturbance    Postoperative pain    Controlled type 1 diabetes mellitus with hyperglycemia (HCC)    ESRD on dialysis (HCC) 01/24/2020   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HCC) 01/23/2020   BKA stump complication (HCC)    Hyponatremia    Constipation    Post-op pain    Chronic osteomyelitis involving left ankle and foot (HCC)    Ulcer of left foot with necrosis of bone (HCC)    Fracture 12/28/2019   AKI (acute kidney injury) (HCC) 12/28/2019   Volume overload 12/28/2019   Acute on chronic anemia 12/28/2019   CKD (chronic kidney disease) stage 5, GFR less than 15 ml/min (HCC) 12/06/2019   History of [place] amputation of left foot (HCC) 12/25/2017   Dyslipidemia, goal LDL below 100 07/04/2017    History of CVA (cerebrovascular accident) without residual deficits 07/04/2017   Cerebral thrombosis with cerebral infarction 04/08/2017   Acute right-sided weakness 04/07/2017   Slurred speech 04/07/2017   Right sided weakness 04/07/2017   Hyperhidrosis 09/01/2016   Migraine with aura and without status migrainosus, not intractable 07/28/2016   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   CKD stage 3 due to type 2 diabetes mellitus (HCC) 06/27/2014   Vitamin D insufficiency 05/08/2014   Obesity (BMI 30.0-34.9) 05/08/2014   Unilateral amputation of left foot (HCC) 05/08/2014   Bursitis of left shoulder 02/14/2014   Neck pain 01/03/2014   Type 2 diabetes mellitus with renal complication (HCC) 12/01/2013   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.23) 01/14/2013   Insomnia 08/04/2012   Diabetic neuropathy, painful (HCC) 08/04/2011   Anxiety and depression 05/16/2010   Essential hypertension, benign 11/01/2007   Female stress incontinence 11/01/2007   TOBACCO DEPENDENCE 04/30/2006   GASTROESOPHAGEAL REFLUX, NO ESOPHAGITIS 04/30/2006   Irritable bowel syndrome 04/30/2006    Orientation RESPIRATION BLADDER Height & Weight     Self, Time, Situation, Place  Normal Continent Weight: 83.2 kg Height:  5\' 10"  (177.8 cm)  BEHAVIORAL SYMPTOMS/MOOD NEUROLOGICAL BOWEL NUTRITION STATUS      Continent  1200 ML daily Fluid restriction  AMBULATORY STATUS COMMUNICATION OF NEEDS Skin   Extensive Assist Verbally Normal  Personal Care Assistance Level of Assistance  Bathing, Dressing, Feeding Bathing Assistance: Limited assistance Feeding assistance: Independent Dressing Assistance: Maximum assistance     Functional Limitations Info             SPECIAL CARE FACTORS FREQUENCY  PT (By licensed PT), OT (By licensed OT)     PT Frequency: 5 times per week OT Frequency: 5 times per week             Contractures Contractures Info: Not present    Additional Factors Info  Allergies, Code Status Code Status Info: full code Allergies Info: Trazodone, Latex, Lidocaine           Current Medications (07/23/2021):  This is the current hospital active medication list Current Facility-Administered Medications  Medication Dose Route Frequency Provider Last Rate Last Admin   acetaminophen (TYLENOL) tablet 650 mg  650 mg Oral Q6H PRN Cox, Amy N, DO   650 mg at 07/22/21 0119   Or   acetaminophen (TYLENOL) suppository 650 mg  650 mg Rectal Q6H PRN Cox, Amy N, DO       amitriptyline (ELAVIL) tablet 25 mg  25 mg Oral QHS Cox, Amy N, DO   25 mg at 07/22/21 2210   amLODipine (NORVASC) tablet 5 mg  5 mg Oral Daily Cox, Amy N, DO       atorvastatin (LIPITOR) tablet 40 mg  40 mg Oral q1800 Cox, Amy N, DO   40 mg at 07/22/21 1700   carvedilol (COREG) tablet 6.25 mg  6.25 mg Oral BID Cox, Amy N, DO   6.25 mg at 07/22/21 2209   Chlorhexidine Gluconate Cloth 2 % PADS 6 each  6 each Topical Q0600 Wendee Beavers, NP   6 each at 07/23/21 0530   citalopram (CELEXA) tablet 20 mg  20 mg Oral Daily Cox, Amy N, DO   20 mg at 07/23/21 0908   cyclobenzaprine (FLEXERIL) tablet 5 mg  5 mg Oral TID PRN Cox, Amy N, DO   5 mg at 07/23/21 0409   DULoxetine (CYMBALTA) DR capsule 60 mg  60 mg Oral Daily Cox, Amy N, DO   60 mg at 07/23/21 0908   heparin injection 5,000 Units  5,000 Units Subcutaneous Q8H Cox, Amy N, DO   5,000 Units at 07/23/21 1314   hydrALAZINE (APRESOLINE) injection 5 mg  5 mg Intravenous Q6H PRN Cox, Amy N, DO       HYDROmorphone (DILAUDID) injection 0.5 mg  0.5 mg Intravenous Q4H PRN Marrion Coy, MD   0.5 mg at 07/23/21 1243   insulin aspart (novoLOG) injection 0-6 Units  0-6 Units Subcutaneous TID WC Marrion Coy, MD   2 Units at 07/23/21 1243   insulin glargine-yfgn (SEMGLEE) injection 6 Units  6 Units Subcutaneous QHS Marrion Coy, MD       nicotine (NICODERM CQ - dosed in mg/24 hours) patch  21 mg  21 mg Transdermal Daily PRN Cox, Amy N, DO       ondansetron (ZOFRAN) tablet 4 mg  4 mg Oral Q6H PRN Cox, Amy N, DO       Or   ondansetron (ZOFRAN) injection 4 mg  4 mg Intravenous Q6H PRN Cox, Amy N, DO       oxyCODONE-acetaminophen (PERCOCET) 7.5-325 MG per tablet 1 tablet  1 tablet Oral Q4H PRN Marrion Coy, MD   1 tablet at 07/23/21 1147   pantoprazole (PROTONIX) EC tablet 40 mg  40 mg Oral Daily Marrion Coy, MD  40 mg at 07/23/21 1350   patiromer Lelon Perla) packet 8.4 g  8.4 g Oral Daily Cox, Amy N, DO   8.4 g at 07/23/21 1314   polyethylene glycol (MIRALAX / GLYCOLAX) packet 17 g  17 g Oral Daily PRN Cox, Amy N, DO       sevelamer carbonate (RENVELA) tablet 1,600 mg  1,600 mg Oral TID WC Cox, Amy N, DO   1,600 mg at 07/23/21 1147     Discharge Medications: Please see discharge summary for a list of discharge medications.  Relevant Imaging Results:  Relevant Lab Results:   Additional Information SS#[id number]  Mykale Gandolfo Seward Carol, RN

## 2021-07-23 NOTE — Progress Notes (Signed)
The above named patient is recommended to go to Short Term Rehab for strengthening and gait training for balance.  It is expected that the Short Term Rehab stay will be less than 30 days.  The patient is expected to return home after Rehab.  ?

## 2021-07-23 NOTE — TOC Progression Note (Addendum)
Transition of Care [facility]) - Progression Note    Patient Details  Name: [name] MRN: [id number] Date of Birth: [birth year]  Transition of Care [facility]) CM/SW [place], RN Phone Number: 07/23/2021, 4:35 PM  Clinical Narrative:     Uploaded the requested information to [place] MUST to obtain PASSR number  Expected Discharge Plan: [place] Barriers to Discharge: Continued Medical Work up  Expected Discharge Plan and Services Expected Discharge Plan: [place]   Discharge Planning Services: CM Consult   Living arrangements for the past 2 months: Single Family Home                 DME Arranged: N/A                     Social Determinants of Health (SDOH) Interventions    Readmission Risk Interventions     View : No data to display.

## 2021-07-23 NOTE — Plan of Care (Signed)

## 2021-07-23 NOTE — Progress Notes (Signed)
Physical Therapy Treatment Patient Details Name: [name] MRN: [id number] DOB: 1966/01/04 Today's Date: 07/23/2021   History of Present Illness Pt is a 56 y/o F admitted on 07/21/21 after presenting to ED for c/c of pain from falling during transfer from w/c to recliner ~1 week ago & pt missed dialysis for a whole week. Pt found to ahve distal L femur fx & deemed no surgery by orthopedics but NWB LLE. PMH: HTN, HLD, ESRD on HD MWF, L BKA in 2021, depression, anxiety, insulin-dependent DM, insomnia, chronic pain syndromes    PT Comments    Pre-medicated before session.  Supine AAROM for limited ROM and attempt to straighten LLE as she keeps LE held in ext rotation.  Too painful to reposition to neutral.  She does agree to attempt EOB today.  She transitions very slowly to long sitting in bed and is able to pivot to EOB with min a x 1 with pad to help pivot hips.  About 10 minutes to get to EOB.  Generally comfortable in sitting EOB but does not want to try standing even if +2 is obtained.  She has significant difficulty returning to supine due to pain LLE and eventually +2 is needed as she keeps abandoning transition due to pain.  Long discussion with pt regarding discharge plan.  Pt stated she does not want CIR or SNF and wishes to return home - hoping for today after dialysis.  Reviewed discharge and that she would need to get in and out of wheelchair and in/out of private care 3 x a week for dialysis.  She does have all equipment at home.  She is unrealistic in her mobility skills and the amount of pain that is affecting her mobility.  She continued to state she felt able to return home.  MD in room at end of session and relayed to him her thought to return home and they were discussing it as I left.   CIR was originally recommended,  Will leave as is for now as CIR has looked at her.  If she continues to decline, SNF would be next recommendation as they would be able to get her to dialysis in  a transport van.  She stated [facility] vans do not go to her home in [place] so that is not an option for her.   If she continues to decline a facility, HHPT and aide services would be necessary but she would be at high risk for falls and not making out patient dialysis appointments due to pain/transfer ability.  Pt did c/o feeling of "popping" during attempt to return to supine in femur.  It was also felt by my.  Care was taken to prevent twisting/sheering forces during transition.  Pain did not seem to sharply increase with complaints.  Discussed with Dr. Roosevelt Locks at bedside.    Recommendations for follow up therapy are one component of a multi-disciplinary discharge planning process, led by the attending physician.  Recommendations may be updated based on patient status, additional functional criteria and insurance authorization.  Follow Up Recommendations  Acute inpatient rehab (3hours/day)     Assistance Recommended at Discharge Frequent or constant Supervision/Assistance  Patient can return home with the following Two people to help with walking and/or transfers;Two people to help with bathing/dressing/bathroom;Help with stairs or ramp for entrance;Assist for transportation;Assistance with cooking/housework   Equipment Recommendations  None recommended by PT    Recommendations for Other Services       Precautions / Restrictions  Precautions Precautions: Fall Restrictions Weight Bearing Restrictions: Yes LLE Weight Bearing: Non weight bearing Other Position/Activity Restrictions: pt also with chronic L BKA     Mobility  Bed Mobility Overal bed mobility: Needs Assistance Bed Mobility: Rolling, Supine to Sit, Sit to Supine Rolling: Min assist   Supine to sit: Min assist Sit to supine: Max assist, +2 for physical assistance   General bed mobility comments: very slow transition to EOB with min a x 1 to get fully to EOB.  unable to return to supine without max a x 2 due to pain during  transition    Transfers                   General transfer comment: Pt refusal to attempt    Ambulation/Gait                   Stairs             Wheelchair Mobility    Modified Rankin (Stroke Patients Only)       Balance Overall balance assessment: Needs assistance Sitting-balance support: Bilateral upper extremity supported, Feet supported Sitting balance-Leahy Scale: Good Sitting balance - Comments: able to remain sitting on her own for extended time in varried positions with good comtrol       Standing balance comment: refuses to attempt                            Cognition Arousal/Alertness: Awake/alert Behavior During Therapy: WFL for tasks assessed/performed Overall Cognitive Status: No family/caregiver present to determine baseline cognitive functioning                                 General Comments: poor awareness and insight to deficits        Exercises Other Exercises Other Exercises: supine AAROM    General Comments        Pertinent Vitals/Pain Pain Assessment Pain Assessment: PAINAD Pain Score: 10-Worst pain ever Pain Descriptors / Indicators: Discomfort, Spasm Pain Intervention(s): Limited activity within patient's tolerance, Monitored during session, Premedicated before session, Repositioned, Relaxation    Home Living Family/patient expects to be discharged to:: Private residence Living Arrangements: Children Available Help at Discharge: Family;Available PRN/intermittently Type of Home: Mobile home Home Access: Ramped entrance       Home Layout: One level Home Equipment: Wheelchair - manual;BSC/3in1;Rollator (4 wheels)      Prior Function            PT Goals (current goals can now be found in the care plan section) Progress towards PT goals: Progressing toward goals    Frequency    7X/week      PT Plan Current plan remains appropriate    Co-evaluation               AM-PAC PT "6 Clicks" Mobility   Outcome Measure  Help needed turning from your back to your side while in a flat bed without using bedrails?: A Lot Help needed moving from lying on your back to sitting on the side of a flat bed without using bedrails?: A Lot Help needed moving to and from a bed to a chair (including a wheelchair)?: Total Help needed standing up from a chair using your arms (e.g., wheelchair or bedside chair)?: Total Help needed to walk in hospital room?: Total Help needed climbing 3-5 steps with a railing? :  Total 6 Click Score: 8    End of Session   Activity Tolerance: Patient limited by pain Patient left: in bed;with bed alarm set;with call bell/phone within reach Nurse Communication: Mobility status PT Visit Diagnosis: Difficulty in walking, not elsewhere classified (R26.2);Other abnormalities of gait and mobility (R26.89);Muscle weakness (generalized) (M62.81);Other (comment);Pain Pain - Right/Left: Left Pain - part of body: Leg     Time: 1043-1110 PT Time Calculation (min) (ACUTE ONLY): 27 min  Charges:  $Therapeutic Exercise: 8-22 mins $Therapeutic Activity: 8-22 mins                   Chesley Noon, PTA 07/23/21, 11:40 AM

## 2021-07-23 NOTE — Progress Notes (Signed)
[facility]  ROUNDING NOTE   Subjective:   Patient seen laying in bed Currently complains of pain and discomfort from left stump States it feels twisted.  Tolerating meals without nausea and vomiting Remains on room air States she is ready for discharge later today.   Objective:  Vital signs in last 24 hours:  Temp:  [98 F (36.7 C)-98.5 F (36.9 C)] 98.5 F (36.9 C) (05/23 0826) Pulse Rate:  [80-103] 80 (05/23 0826) Resp:  [15-22] 17 (05/23 0826) BP: (78-132)/(48-70) 132/70 (05/23 0826) SpO2:  [95 %-100 %] 97 % (05/23 0826) Weight:  [83.2 kg] 83.2 kg (05/22 1327)  Weight change: 6.888 kg Filed Weights   07/21/21 1722 07/22/21 1007 07/22/21 1327  Weight: 77.1 kg 84 kg 83.2 kg    Intake/Output: I/O last 3 completed shifts: In: 680 [P.O.:680] Out: 2001 [Other:2001]   Intake/Output this shift:  No intake/output data recorded.  Physical Exam: General: NAD  Head: Normocephalic, atraumatic. Moist oral mucosal membranes  Eyes: Anicteric  Lungs:  Clear to auscultation,normal effort  Heart: Regular rate and rhythm  Abdomen:  Soft, nontender  Extremities:  No peripheral edema. Lt BKA  Neurologic: Nonfocal, moving all four extremities  Skin: No lesions  Access: Lt AVF    Basic Metabolic Panel: Recent Labs  Lab 07/21/21 1729 07/22/21 0256 07/23/21 1127  NA 127* 129* 132*  K 6.1* 5.7* 4.4  CL 89* 87* 92*  CO2 13* 15* 23  GLUCOSE 126* 98 210*  BUN 160* 156* 89*  CREATININE 14.43* 14.55* 9.20*  CALCIUM 8.3* 8.1* 8.3*    Liver Function Tests: Recent Labs  Lab 07/21/21 1729  AST 12*  ALT 8  ALKPHOS 192*  BILITOT 1.3*  PROT 7.7  ALBUMIN 2.9*   No results for input(s): LIPASE, AMYLASE in the last 168 hours. No results for input(s): AMMONIA in the last 168 hours.  CBC: Recent Labs  Lab 07/21/21 1729 07/22/21 0256 07/23/21 0418  WBC 17.2* 19.1* 13.4*  HGB 9.0* 8.4* 7.8*  HCT 28.0* 25.4* 24.2*  MCV 89.7 87.6 88.3  PLT 277 256 249     Cardiac Enzymes: No results for input(s): CKTOTAL, CKMB, CKMBINDEX, TROPONINI in the last 168 hours.  BNP: Invalid input(s): POCBNP  CBG: Recent Labs  Lab 07/22/21 0930 07/22/21 1607 07/22/21 1644 07/22/21 2217 07/23/21 0853  GLUCAP 75 71 98 153* 168*    Microbiology: Results for orders placed or performed during the hospital encounter of 07/21/21  Culture, blood (Routine X 2) w Reflex to ID Panel     Status: None (Preliminary result)   Collection Time: 07/22/21  2:44 PM   Specimen: Right Antecubital; Blood  Result Value Ref Range Status   Specimen Description RIGHT ANTECUBITAL  Final   Special Requests   Final    BOTTLES DRAWN AEROBIC AND ANAEROBIC Blood Culture adequate volume   Culture   Final    NO GROWTH < 24 HOURS Performed at [facility], [address] [place], [place] [address]    Report Status PENDING  Incomplete  Culture, blood (Routine X 2) w Reflex to ID Panel     Status: None (Preliminary result)   Collection Time: 07/22/21  2:46 PM   Specimen: BLOOD RIGHT HAND  Result Value Ref Range Status   Specimen Description BLOOD RIGHT HAND  Final   Special Requests   Final    BOTTLES DRAWN AEROBIC AND ANAEROBIC Blood Culture adequate volume   Culture   Final    NO  GROWTH < 24 HOURS Performed at [facility], [address] [place], [place] [address]    Report Status PENDING  Incomplete    Coagulation Studies: No results for input(s): LABPROT, INR in the last 72 hours.  Urinalysis: No results for input(s): COLORURINE, LABSPEC, PHURINE, GLUCOSEU, HGBUR, BILIRUBINUR, KETONESUR, PROTEINUR, UROBILINOGEN, NITRITE, LEUKOCYTESUR in the last 72 hours.  Invalid input(s): APPERANCEUR    Imaging: DG Chest Port 1 View  Result Date: 07/21/2021 CLINICAL DATA:  Femoral fracture. Medical clearance for surgical repair. EXAM: PORTABLE CHEST 1 VIEW COMPARISON:  None Available. FINDINGS: Lung volumes are small, but are symmetric. Interstitial  opacity at the left lung base may represent atelectasis or infiltrate. No pneumothorax or pleural effusion. Cardiac size within normal limits. No acute bone abnormality. IMPRESSION: Pulmonary hypoinflation.  Left basilar atelectasis or infiltrate. Electronically Signed   By: Fidela Salisbury M.D.   On: 07/21/2021 21:15   DG Knee Complete 4 Views Left  Result Date: 07/21/2021 CLINICAL DATA:  Fall, left knee pain EXAM: LEFT KNEE - COMPLETE 4+ VIEW COMPARISON:  None Available. FINDINGS: Prior below the knee amputation. There is a comminuted fracture in the distal left femoral metaphysis. Mildly displaced fracture fragments anteriorly. Diffuse osteopenia. IMPRESSION: Prior below-knee amputation. Comminuted, mildly displaced distal left femoral fracture. Electronically Signed   By: Rolm Baptise M.D.   On: 07/21/2021 18:25     Medications:     amitriptyline  25 mg Oral QHS   amLODipine  5 mg Oral Daily   atorvastatin  40 mg Oral q1800   carvedilol  6.25 mg Oral BID   Chlorhexidine Gluconate Cloth  6 each Topical Q0600   citalopram  20 mg Oral Daily   DULoxetine  60 mg Oral Daily   heparin  5,000 Units Subcutaneous Q8H   insulin aspart  0-6 Units Subcutaneous TID WC   patiromer  8.4 g Oral Daily   sevelamer carbonate  1,600 mg Oral TID WC   acetaminophen **OR** acetaminophen, cyclobenzaprine, hydrALAZINE, HYDROmorphone (DILAUDID) injection, nicotine, ondansetron **OR** ondansetron (ZOFRAN) IV, oxyCODONE-acetaminophen, polyethylene glycol  Assessment/ Plan:  [name] is a 56 y.o.  female with past medical history including hyperlipidemia, hypertension, left BKA, diabetes, chronic pain syndrome, and end-stage renal disease on hemodialysis, who was admitted to [facility] on 07/21/2021 for Acidosis [E87.20] Hyperkalemia [E87.5] Elevated BUN [R79.9] ESRD (end stage renal disease) on dialysis ([place]) [N18.6, Z99.2] Femur fracture, left (HCC) [S72.92XA] Closed fracture of left femur, unspecified  fracture morphology, unspecified portion of femur, initial encounter ([place]) [S72.92XA] Closed fracture of left distal femur ([place]) [S72.402A]  CK [facility]/MWF/left aVF/83 kg  End-stage renal disease on dialysis.  Will maintain outpatient schedule if possible.  Patient scheduled to receive dialysis today, UF 2 L as tolerated.  Next treatment scheduled for Wednesday.  2. Anemia of chronic kidney disease Normocytic Lab Results  Component Value Date   HGB 7.8 (L) 07/23/2021    Hgb below desired target. Patient receives [place] outpatient.   3. Secondary Hyperparathyroidism:  Lab Results  Component Value Date   PTH 44 01/11/2020   PTH Comment 01/11/2020   CALCIUM 8.3 (L) 07/23/2021   CAION 1.10 (L) 04/07/2017   PHOS 5.1 (H) 02/20/2020    Calcium and phosphorus within acceptable range. Will continue to monitor. Continue Sevelamer with meals.  4.  Hypertension with chronic kidney disease.  Home regimen includes amlodipine, carvedilol and hydralazine.  Currently receiving these medications.  Blood pressure 132/70  5. Diabetes mellitus type II with  chronic kidney disease: insulin dependent. Home regimen includes to send NovoLog 70/30. Most recent hemoglobin A1c is 6.2 on 12/29/19.              Glucose stable. Ssi supervised by primary team.   LOS: 1 [place] 5/23/202312:09 PM

## 2021-07-23 NOTE — TOC Progression Note (Addendum)
Transition of Care [facility]) - Progression Note    Patient Details  Name: [name] MRN: [id number] Date of Birth: [birth year]  Transition of Care [facility]) CM/SW [employer], RN Phone Number: 07/23/2021, 2:36 PM  Clinical Narrative:    Patient lives alone, she has a Manual wheelchair at home, she is agreeable to a Estate manager/land agent for STR SNF FL2 completed, Bedsearch sent, PASSR Pending, will upload clinical once FL2 and 30 day note is signed   Expected Discharge Plan: [place] Barriers to Discharge: Continued Medical Work up  Expected Discharge Plan and Services Expected Discharge Plan: [place]   Discharge Planning Services: CM Consult   Living arrangements for the past 2 months: Single Family Home                 DME Arranged: N/A                     Social Determinants of Health (SDOH) Interventions    Readmission Risk Interventions     View : No data to display.

## 2021-07-23 NOTE — Progress Notes (Signed)
  Progress Note   Patient: [name] HCW:[id number] DOB: [birth year] DOA: 07/21/2021     1 DOS: the patient was seen and examined on 07/23/2021   Brief hospital course: [name] is a 56 year old female with history of hypertension, hyperlipidemia, end-stage renal disease on HD MWF, history of left BKA in 2021, depression, anxiety, insulin-dependent diabetes mellitus, insomnia, chronic pain syndrome, who presents emergency department for chief concerns of pain from falling during transfer from wheelchair to recliner about 1 week ago. She missed dialysis for whole week.  Upon arriving the hospital, she was found to have a distal left femur fracture, deemed no surgery by orthopedics.  But no weightbearing.  Patient is also started on dialysis.  Assessment and Plan: Closed fracture of the left distal femur. S/p Left BKA Mechanical fall. Patient did not have any syncope.  Per orthopedics, patient does not need surgery at this time, but she is nonweightbearing. Patient still has significant pain, added as needed Dilaudid as well as oral Percocet. Discussed with PT, patient most likely will not be able to function at home, recommended CIR.  Patient so far has been resistant to the transfer.  But given the severity of the pain, patient has not been ambulating.  She will need rehab.  End-stage renal Disease on dialysis. Hyperkalemia. Hyponatremia. Metabolic acidosis Patient is receiving the second day of dialysis.  Followed by nephrology.   Type 2 diabetes with hypoglycemia. Had a significant hypoglycemia yesterday morning, long-acting insulin was discontinued.  Continue sliding scale insulin.  We will add back 6 units of insulin glargine again today as hypoglycemia has resolved.   Essential hypertension. Continue amlodipine and Coreg      Subjective:  She is still complaining severe left knee pain.  Denies any nausea vomiting.  Physical Exam: Vitals:   07/22/21 1419 07/22/21  2049 07/23/21 0327 07/23/21 0826  BP: 128/65 (!) 111/56 (!) 125/48 132/70  Pulse: 98 85 80 80  Resp: 16 17 17 17   Temp: 98.3 F (36.8 C) 98 F (36.7 C) 98.4 F (36.9 C) 98.5 F (36.9 C)  TempSrc:      SpO2: 96% 96% 95% 97%  Weight:      Height:       General exam: Appears calm and comfortable  Respiratory system: Clear to auscultation. Respiratory effort normal. Cardiovascular system: S1 & S2 heard, RRR. No JVD, murmurs, rubs, gallops or clicks. No pedal edema. Gastrointestinal system: Abdomen is nondistended, soft and nontender. No organomegaly or masses felt. Normal bowel sounds heard. Central nervous system: Alert and oriented. No focal neurological deficits. Extremities: Left knee swelling and tenderness, status post left BKA. Skin: No rashes, lesions or ulcers Psychiatry: Judgement and insight appear normal. Mood & affect appropriate.   Data Reviewed:  Lab results reviewed.  Family Communication:   Disposition: Status is: Inpatient Remains inpatient appropriate because: Severity of the disease, unsafe discharge  Planned Discharge Destination:  CIR    Time spent: 28 minutes  Author: Sharen Hones, MD 07/23/2021 12:22 PM  For on call review [email or web address].

## 2021-07-24 DIAGNOSIS — F419 Anxiety disorder, unspecified: Secondary | ICD-10-CM | POA: Diagnosis not present

## 2021-07-24 DIAGNOSIS — Z794 Long term (current) use of insulin: Secondary | ICD-10-CM

## 2021-07-24 DIAGNOSIS — N186 End stage renal disease: Secondary | ICD-10-CM | POA: Diagnosis not present

## 2021-07-24 DIAGNOSIS — E11649 Type 2 diabetes mellitus with hypoglycemia without coma: Secondary | ICD-10-CM

## 2021-07-24 DIAGNOSIS — Z8673 Personal history of transient ischemic attack (TIA), and cerebral infarction without residual deficits: Secondary | ICD-10-CM

## 2021-07-24 DIAGNOSIS — S72352A Displaced comminuted fracture of shaft of left femur, initial encounter for closed fracture: Secondary | ICD-10-CM | POA: Diagnosis not present

## 2021-07-24 DIAGNOSIS — E872 Acidosis, unspecified: Secondary | ICD-10-CM

## 2021-07-24 DIAGNOSIS — S72492A Other fracture of lower end of left femur, initial encounter for closed fracture: Secondary | ICD-10-CM | POA: Diagnosis not present

## 2021-07-24 LAB — GLUCOSE, CAPILLARY
Glucose-Capillary: 179 mg/dL — ABNORMAL HIGH (ref 70–99)
Glucose-Capillary: 151 mg/dL — ABNORMAL HIGH (ref 70–99)
Glucose-Capillary: 261 mg/dL — ABNORMAL HIGH (ref 70–99)
Glucose-Capillary: 258 mg/dL — ABNORMAL HIGH (ref 70–99)

## 2021-07-24 MED ORDER — EPOETIN ALFA 10000 UNIT/ML IJ SOLN: 10000.0000 [IU] | INTRAMUSCULAR | Status: DC

## 2021-07-24 MED ORDER — EPOETIN ALFA 10000 UNIT/ML IJ SOLN
10000.0000 [IU] | INTRAMUSCULAR | Status: DC
  Administered 2021-07-24 – 2021-08-05 (×6): 10000 [IU] via INTRAVENOUS
  Filled 2021-07-24 (×6): qty 1

## 2021-07-24 MED ORDER — EPOETIN ALFA 10000 UNIT/ML IJ SOLN
INTRAMUSCULAR | Status: AC
  Filled 2021-07-24: qty 1

## 2021-07-24 NOTE — Progress Notes (Signed)
0900 Pt being transported down to dialysis at this time

## 2021-07-24 NOTE — TOC Progression Note (Signed)
Transition of Care [facility]) - Progression Note    Patient Details  Name: [name] MRN: [id number] Date of Birth: 07-21-1965  Transition of Care [facility]) CM/SW [place], RN Phone Number: 07/24/2021, 2:07 PM  Clinical Narrative:    Met with the patient to review the STR SNF Bed options, She stated that she has not talked to her daughter yet about it and is not willing to make a decision yet, she said the last time she made a quick decision she was in the hospital for 2 months and does not want to do that again,  I explained that she will not be able to make it safely at home living alone unless someone would be staying with her that could provide care 24 hours a day, SHE stated that she will talk to her daughter and let me know. I expanded the bedsearch to go thru the whole HUB   Expected Discharge Plan: [place] Barriers to Discharge: Continued Medical Work up  Expected Discharge Plan and Services Expected Discharge Plan: [place]   Discharge Planning Services: CM Consult   Living arrangements for the past 2 months: Single Family Home                 DME Arranged: N/A                     Social Determinants of Health (SDOH) Interventions    Readmission Risk Interventions     View : No data to display.

## 2021-07-24 NOTE — Progress Notes (Signed)
Occupational Therapy Treatment Patient Details Name: [name] MRN: [id number] DOB: September 25, 1965 Today's Date: 07/24/2021   History of present illness Pt is a 56 y/o F admitted on 07/21/21 after presenting to ED for c/c of pain from falling during transfer from w/c to recliner ~1 week ago & pt missed dialysis for a whole week. Pt found to ahve distal L femur fx & deemed no surgery by orthopedics but NWB LLE. PMH: HTN, HLD, ESRD on HD MWF, L BKA in 2021, depression, anxiety, insulin-dependent DM, insomnia, chronic pain syndromes   OT comments  Pt is supine in bed and needing encouragement to participate in therapeutic intervention. Pt continues to report 9/10 pain and refusing EOB and OOB activity again. Pt was agreeable to B UE strengthening with use of red theraband. Pt performs 10 reps of chest pulls, shoulder diagonals, shoulder elevation, and alternating punches with rest breaks as needed. Pt asking about therapy recommendations and OT discussing SNF recommendation and explaining to pt that she needs to participate if her goal is to return home. Pt verbalized understanding. All needs within reach.     Recommendations for follow up therapy are one component of a multi-disciplinary discharge planning process, led by the attending physician.  Recommendations may be updated based on patient status, additional functional criteria and insurance authorization.    Follow Up Recommendations  Skilled nursing-short term rehab (<3 hours/day)    Assistance Recommended at Discharge Intermittent Supervision/Assistance  Patient can return home with the following  A lot of help with walking and/or transfers;A lot of help with bathing/dressing/bathroom;Help with stairs or ramp for entrance;Assistance with cooking/housework   Equipment Recommendations  Other (comment) (defer to next venue of care)       Precautions / Restrictions Precautions Precautions: Fall Restrictions Weight Bearing Restrictions:  Yes LLE Weight Bearing: Non weight bearing Other Position/Activity Restrictions: pt also with chronic L BKA       Mobility Bed Mobility               General bed mobility comments: pt refusal    Transfers                   General transfer comment: Pt refusal to attempt         ADL either performed or assessed with clinical judgement    Extremity/Trunk Assessment Upper Extremity Assessment Upper Extremity Assessment: Generalized weakness   Lower Extremity Assessment Lower Extremity Assessment: Generalized weakness        Vision Patient Visual Report: No change from baseline            Cognition Arousal/Alertness: Awake/alert Behavior During Therapy: WFL for tasks assessed/performed Overall Cognitive Status: No family/caregiver present to determine baseline cognitive functioning                                 General Comments: poor awareness and insight to deficits                   Pertinent Vitals/ Pain       Pain Assessment Pain Assessment: 0-10 Pain Score: 9  Pain Location: L LE Pain Descriptors / Indicators: Discomfort, Spasm Pain Intervention(s): Monitored during session, Premedicated before session         Frequency  Min 2X/week        Progress Toward Goals  OT Goals(current goals can now be found in the care plan section)  Progress  towards OT goals: Not progressing toward goals - comment (pt declined all mobility)  Acute Rehab OT Goals Patient Stated Goal: to get better OT Goal Formulation: With patient Time For Goal Achievement: 08/06/21 Potential to Achieve Goals: [place] Discharge plan remains appropriate;Frequency remains appropriate       AM-PAC OT "6 Clicks" Daily Activity     Outcome Measure   Help from another person eating meals?: None Help from another person taking care of personal grooming?: A Little Help from another person toileting, which includes using toliet, bedpan, or  urinal?: Total Help from another person bathing (including washing, rinsing, drying)?: A Lot Help from another person to put on and taking off regular upper body clothing?: A Little Help from another person to put on and taking off regular lower body clothing?: Total 6 Click Score: 14    End of Session    OT Visit Diagnosis: Muscle weakness (generalized) (M62.81);Unsteadiness on feet (R26.81);History of falling (Z91.81)   Activity Tolerance Patient limited by pain   Patient Left in bed;with call bell/phone within reach;with bed alarm set   Nurse Communication Mobility status        Time: 1194-1740 OT Time Calculation (min): 23 min  Charges: OT General Charges $OT Visit: 1 Visit OT Treatments $Therapeutic Exercise: 23-37 mins  Darleen Crocker, MS, OTR/L , CBIS ascom [phone]  07/24/21, 4:19 PM

## 2021-07-24 NOTE — Progress Notes (Signed)
[facility]  ROUNDING NOTE   Subjective:   Patient seen end evaluated during dialysis   HEMODIALYSIS FLOWSHEET:  Blood Flow Rate (mL/min): 350 mL/min Arterial Pressure (mmHg): -260 mmHg Venous Pressure (mmHg): 250 mmHg Transmembrane Pressure (mmHg): 70 mmHg Ultrafiltration Rate (mL/min): 840 mL/min Dialysate Flow Rate (mL/min): 500 ml/min Conductivity: Machine : 13.8 Conductivity: Machine : 13.8 Dialysis Fluid Bolus: Normal Saline Bolus Amount (mL): 150 mL  No complaints at this time  Objective:  Vital signs in last 24 hours:  Temp:  [97.8 F (36.6 C)-98.9 F (37.2 C)] 98.6 F (37 C) (05/24 0904) Pulse Rate:  [79-85] 80 (05/24 1030) Resp:  [14-18] 15 (05/24 1030) BP: (111-164)/(42-80) 164/59 (05/24 1030) SpO2:  [94 %-98 %] 97 % (05/24 0905) Weight:  [82.2 kg] 82.2 kg (05/24 0904)  Weight change:  Filed Weights   07/22/21 1007 07/22/21 1327 07/24/21 0904  Weight: 84 kg 83.2 kg 82.2 kg    Intake/Output: I/O last 3 completed shifts: In: 540 [P.O.:540] Out: -    Intake/Output this shift:  No intake/output data recorded.  Physical Exam: General: NAD  Head: Normocephalic, atraumatic. Moist oral mucosal membranes  Eyes: Anicteric  Lungs:  Clear to auscultation,normal effort  Heart: Regular rate and rhythm  Abdomen:  Soft, nontender  Extremities:  No peripheral edema. Lt BKA  Neurologic: Nonfocal, moving all four extremities  Skin: No lesions  Access: Lt AVF    Basic Metabolic Panel: Recent Labs  Lab 07/21/21 1729 07/22/21 0256 07/23/21 1127  NA 127* 129* 132*  K 6.1* 5.7* 4.4  CL 89* 87* 92*  CO2 13* 15* 23  GLUCOSE 126* 98 210*  BUN 160* 156* 89*  CREATININE 14.43* 14.55* 9.20*  CALCIUM 8.3* 8.1* 8.3*     Liver Function Tests: Recent Labs  Lab 07/21/21 1729  AST 12*  ALT 8  ALKPHOS 192*  BILITOT 1.3*  PROT 7.7  ALBUMIN 2.9*    No results for input(s): LIPASE, AMYLASE in the last 168 hours. No results for input(s):  AMMONIA in the last 168 hours.  CBC: Recent Labs  Lab 07/21/21 1729 07/22/21 0256 07/23/21 0418  WBC 17.2* 19.1* 13.4*  HGB 9.0* 8.4* 7.8*  HCT 28.0* 25.4* 24.2*  MCV 89.7 87.6 88.3  PLT 277 256 249     Cardiac Enzymes: No results for input(s): CKTOTAL, CKMB, CKMBINDEX, TROPONINI in the last 168 hours.  BNP: Invalid input(s): POCBNP  CBG: Recent Labs  Lab 07/23/21 0853 07/23/21 1235 07/23/21 1600 07/23/21 2110 07/24/21 0851  GLUCAP 168* 244* 267* 215* 179*     Microbiology: Results for orders placed or performed during the hospital encounter of 07/21/21  Culture, blood (Routine X 2) w Reflex to ID Panel     Status: None (Preliminary result)   Collection Time: 07/22/21  2:44 PM   Specimen: Right Antecubital; Blood  Result Value Ref Range Status   Specimen Description RIGHT ANTECUBITAL  Final   Special Requests   Final    BOTTLES DRAWN AEROBIC AND ANAEROBIC Blood Culture adequate volume   Culture   Final    NO GROWTH 2 DAYS Performed at [facility], [address], [place], [place] [address]    Report Status PENDING  Incomplete  Culture, blood (Routine X 2) w Reflex to ID Panel     Status: None (Preliminary result)   Collection Time: 07/22/21  2:46 PM   Specimen: BLOOD RIGHT HAND  Result Value Ref Range Status   Specimen Description BLOOD RIGHT HAND  Final   Special Requests   Final    BOTTLES DRAWN AEROBIC AND ANAEROBIC Blood Culture adequate volume   Culture   Final    NO GROWTH 2 DAYS Performed at [facility], [address] [place], [place] [address]    Report Status PENDING  Incomplete    Coagulation Studies: No results for input(s): LABPROT, INR in the last 72 hours.  Urinalysis: No results for input(s): COLORURINE, LABSPEC, PHURINE, GLUCOSEU, HGBUR, BILIRUBINUR, KETONESUR, PROTEINUR, UROBILINOGEN, NITRITE, LEUKOCYTESUR in the last 72 hours.  Invalid input(s): APPERANCEUR    Imaging: No results found.   Medications:      amitriptyline  25 mg Oral QHS   amLODipine  5 mg Oral Daily   atorvastatin  40 mg Oral q1800   carvedilol  6.25 mg Oral BID   Chlorhexidine Gluconate Cloth  6 each Topical Q0600   citalopram  20 mg Oral Daily   DULoxetine  60 mg Oral Daily   heparin  5,000 Units Subcutaneous Q8H   insulin aspart  0-6 Units Subcutaneous TID WC   insulin glargine-yfgn  6 Units Subcutaneous QHS   pantoprazole  40 mg Oral Daily   patiromer  8.4 g Oral Daily   sevelamer carbonate  1,600 mg Oral TID WC   acetaminophen **OR** acetaminophen, cyclobenzaprine, hydrALAZINE, HYDROmorphone (DILAUDID) injection, nicotine, ondansetron **OR** ondansetron (ZOFRAN) IV, oxyCODONE-acetaminophen, polyethylene glycol  Assessment/ Plan:  [name] is a 56 y.o.  female with past medical history including hyperlipidemia, hypertension, left BKA, diabetes, chronic pain syndrome, and end-stage renal disease on hemodialysis, who was admitted to [facility] on 07/21/2021 for Acidosis [E87.20] Hyperkalemia [E87.5] Elevated BUN [R79.9] ESRD (end stage renal disease) on dialysis ([place]) [N18.6, Z99.2] Femur fracture, left (HCC) [S72.92XA] Closed fracture of left femur, unspecified fracture morphology, unspecified portion of femur, initial encounter ([place]) [S72.92XA] Closed fracture of left distal femur ([place]) [S72.402A]  [facility]/MWF/left aVF/83 kg  End-stage renal disease on dialysis.  Will maintain outpatient schedule if possible.    Receiving dialysis today with UF goal 1.5-2L as tolerated. HD access having increased pressures, patient states last vascular assessment a couple months ago. May require intervention soon. Next treatment scheduled for Friday. Monitoring discharge planning to determine adjustments to outpatient clinic.   2. Anemia of chronic kidney disease Normocytic Lab Results  Component Value Date   HGB 7.8 (L) 07/23/2021     Hgb not within acceptable range. Will order EPO 10000 unit IV with  dialysis.   3. Secondary Hyperparathyroidism:  Lab Results  Component Value Date   PTH 44 01/11/2020   PTH Comment 01/11/2020   CALCIUM 8.3 (L) 07/23/2021   CAION 1.10 (L) 04/07/2017   PHOS 5.1 (H) 02/20/2020     Will continue to monitor bone minerals during this admission. Continue Sevelamer with meals.  4.  Hypertension with chronic kidney disease.  Home regimen includes amlodipine, carvedilol and hydralazine.  Currently receiving these medications.  Blood pressure 164/59 during dialysis  5. Diabetes mellitus type II with chronic kidney disease: insulin dependent. Home regimen includes to send NovoLog 70/30. Most recent hemoglobin A1c is 6.2 on 12/29/19.           Primary team to manage SSI   LOS: 2 [place] 5/24/202311:21 AM

## 2021-07-24 NOTE — Progress Notes (Signed)
Hemodialysis Post Treatment Note    Date: Jul 24, 2021    Access: Left AVF     Treatment Time: 2 Hours 16 minutes   UF Removed: 1396 ml    Next Scheduled Treatment: 07/26/21    NOTE: Patient presents for 3-hour hemodialysis treatment. Experience difficulty during cannulation. Multiple adjust made to venous and arterial needle with no success, pressure continue to be high despite all intervention. Colon Flattery, NP in the dialysis suite and made aware of access troubles, order given to terminate treatment with 44 minutes left. Patient hemodynamically stable, complain of pain to left leg of an 8. Medication for pain on MAR not on floor pyxis, pharmacy called regarding providing medication to unit. However, transport came to pick up patient. Patient floor nurse Celso Amy made aware of patient complaint of pain and need to be medication among arrival on unit.

## 2021-07-24 NOTE — Progress Notes (Signed)
PROGRESS NOTE    [name]  YIF:[id number] DOB: January 23, 1966  DOA: 07/21/2021 Date of Service: 07/24/21 PCP: Julian Hy, PA-C     Brief Narrative / Hospital Course:  [name] is a 56 year old female with history of hypertension, hyperlipidemia, end-stage renal disease on HD MWF, history of left BKA in 2021, depression, anxiety, insulin-dependent diabetes mellitus, insomnia, chronic pain syndrome, who presents emergency department for chief concerns of pain from falling during transfer from wheelchair to recliner about 1 week prior to admission. She missed dialysis for whole week.  Upon arriving the hospital 07/21/21, she was found to have a distal left femur fracture, deemed no surgery by orthopedics.  But no weightbearing.  Patient is also restarted on dialysis. As of today 07/24/21 waiting on patient to decide on SNF facility for rehab since she was initially advised inpatient rehab but later stated she did not think she would be able to participate in intensive therapy.  Consultants:  Nephrology Orthopedics  Procedures: Dialysis per routine     Subjective: Patient reports today still feeling in some pain, particularly so burning type pain around left lower extremity stump.  She is participating with PT when I came in to see her, per physical therapy she has overall been participating but not quite at maximal potential effort     ASSESSMENT & PLAN:   Principal Problem:   Femur fracture, left ([place]) Active Problems:   TOBACCO DEPENDENCE   Essential hypertension, benign   Anxiety and depression   Insomnia   Type 2 diabetes mellitus with renal complication (HCC)   Obesity (BMI 30.0-34.9)   Hyponatremia   ESRD on dialysis [facility])   Essential hypertension   History of CVA (cerebrovascular accident) without residual deficits   Hyperkalemia   Metabolic acidemia   Closed fracture of left distal femur ([place])   Uncontrolled type 2 diabetes mellitus with  hypoglycemia, with long-term current use of insulin (HCC)   Essential hypertension, benign - Patient takes amlodipine 5 mg daily, carvedilol 6.25 mg p.o. twice daily, these have been resumed for 07/22/2021 due to low normotensive in setting of requiring hemodialysis - Hydralazine 100 mg 3 times daily have not been resumed due to the same  Femur fracture, left [facility]) - Orthopedic service has been consulted by EDP, who recommends no surgery while inpatient - Orthopedic service recommends brace in flexion of the left knee, nonweightbearing - Patient to follow-up outpatient with orthopedic provider in 1 week after discharge  Hyperkalemia - Presumed secondary to missing hemodialysis session - Status post [place], insulin 5 units, dextrose 1 amp, sodium bicarbonate injection - Nephrology has been consulted and recommended the above  Essential hypertension - Amlodipine 5 mg daily, carvedilol 6.25 mg p.o. twice daily -Hydralazine 5 mg IV every 6 hours.  For SBP greater than 180, 3 doses ordered  Anxiety and depression - Amitriptyline 25 mg nightly, Celexa 20 mg daily, duloxetine 60 mg daily  TOBACCO DEPENDENCE - Nicotine patch as needed for nicotine craving ordered  ESRD on dialysis [facility]) - Nephrology has been consulted via EDP    DVT prophylaxis: heparin Code Status: FULL Family Communication: Patient declines call to family/support person at this time Disposition Plan / TOC needs: It has been made clear to patient that she needs to decide on one of the offered SNF facilities, she is medically stable for discharge and I will plan to put in the order tomorrow Barriers to discharge / significant pending items: SNF placement  Objective: Vitals:   07/24/21 1235 07/24/21 1239 07/24/21 1253 07/24/21 1602  BP:   (!) 144/112 (!) 140/54  Pulse: 99 99 99 89  Resp: 15 15 18 15   Temp: 99 F (37.2 C)  98.2 F (36.8 C) 98.2 F (36.8 C)  TempSrc:      SpO2:   97% 94%   Weight: 82 kg     Height:        Intake/Output Summary (Last 24 hours) at 07/24/2021 1612 Last data filed at 07/24/2021 1428 Gross per 24 hour  Intake 480 ml  Output 1396 ml  Net -916 ml   Filed Weights   07/22/21 1327 07/24/21 0904 07/24/21 1235  Weight: 83.2 kg 82.2 kg 82 kg    Examination:  Constitutional:  VS as above General Appearance: alert, well-developed, well-nourished, NAD Eyes: Normal lids and conjunctive, non-icteric sclera Ears, Nose, Mouth, Throat: Normal appearance MMM Neck: No masses, trachea midline Respiratory: Normal respiratory effort Breath sounds normal, no wheeze/rhonchi/rales Cardiovascular: S1/S2 normal, RRR No lower extremity edema on RLE Gastrointestinal: Nontender, no masses No hepatomegaly, no splenomegaly No hernia appreciated Musculoskeletal:  No clubbing/cyanosis of digits Neurological: No cranial nerve deficit on limited exam Motor and sensation intact and symmetric Psychiatric: Normal judgment/insight Normal mood and affect       Scheduled Medications:   amitriptyline  25 mg Oral QHS   amLODipine  5 mg Oral Daily   atorvastatin  40 mg Oral q1800   carvedilol  6.25 mg Oral BID   Chlorhexidine Gluconate Cloth  6 each Topical Q0600   citalopram  20 mg Oral Daily   DULoxetine  60 mg Oral Daily   epoetin alfa       epoetin (EPOGEN/PROCRIT) injection  10,000 Units Intravenous Q M,W,F-HD   heparin  5,000 Units Subcutaneous Q8H   insulin aspart  0-6 Units Subcutaneous TID WC   insulin glargine-yfgn  6 Units Subcutaneous QHS   pantoprazole  40 mg Oral Daily   patiromer  8.4 g Oral Daily   sevelamer carbonate  1,600 mg Oral TID WC    Continuous Infusions:   PRN Medications:  acetaminophen **OR** acetaminophen, cyclobenzaprine, hydrALAZINE, HYDROmorphone (DILAUDID) injection, nicotine, ondansetron **OR** ondansetron (ZOFRAN) IV, oxyCODONE-acetaminophen, polyethylene glycol  Antimicrobials:  Anti-infectives (From  admission, onward)    None       Data Reviewed: I have personally reviewed following labs and imaging studies  CBC: Recent Labs  Lab 07/21/21 1729 07/22/21 0256 07/23/21 0418  WBC 17.2* 19.1* 13.4*  HGB 9.0* 8.4* 7.8*  HCT 28.0* 25.4* 24.2*  MCV 89.7 87.6 88.3  PLT 277 256 623   Basic Metabolic Panel: Recent Labs  Lab 07/21/21 1729 07/22/21 0256 07/23/21 1127  NA 127* 129* 132*  K 6.1* 5.7* 4.4  CL 89* 87* 92*  CO2 13* 15* 23  GLUCOSE 126* 98 210*  BUN 160* 156* 89*  CREATININE 14.43* 14.55* 9.20*  CALCIUM 8.3* 8.1* 8.3*   GFR: Estimated Creatinine Clearance: 7.6 mL/min (A) (by C-G formula based on SCr of 9.2 mg/dL (H)). Liver Function Tests: Recent Labs  Lab 07/21/21 1729  AST 12*  ALT 8  ALKPHOS 192*  BILITOT 1.3*  PROT 7.7  ALBUMIN 2.9*   No results for input(s): LIPASE, AMYLASE in the last 168 hours. No results for input(s): AMMONIA in the last 168 hours. Coagulation Profile: No results for input(s): INR, PROTIME in the last 168 hours. Cardiac Enzymes: No results for input(s): CKTOTAL, CKMB, CKMBINDEX, TROPONINI in the last 168  hours. BNP (last 3 results) No results for input(s): PROBNP in the last 8760 hours. HbA1C: Recent Labs    07/22/21 1446  HGBA1C 7.1*   CBG: Recent Labs  Lab 07/23/21 1235 07/23/21 1600 07/23/21 2110 07/24/21 0851 07/24/21 1256  GLUCAP 244* 267* 215* 179* 151*   Lipid Profile: No results for input(s): CHOL, HDL, LDLCALC, TRIG, CHOLHDL, LDLDIRECT in the last 72 hours. Thyroid Function Tests: No results for input(s): TSH, T4TOTAL, FREET4, T3FREE, THYROIDAB in the last 72 hours. Anemia Panel: No results for input(s): VITAMINB12, FOLATE, FERRITIN, TIBC, IRON, RETICCTPCT in the last 72 hours. Urine analysis:    Component Value Date/Time   COLORURINE YELLOW 01/19/2020 1402   APPEARANCEUR TURBID (A) 01/19/2020 1402   LABSPEC 1.010 01/19/2020 1402   PHURINE 6.0 01/19/2020 1402   GLUCOSEU 50 (A) 01/19/2020 1402    GLUCOSEU NEGATIVE 03/29/2014 1259   HGBUR NEGATIVE 01/19/2020 1402   BILIRUBINUR NEGATIVE 01/19/2020 1402   BILIRUBINUR neg 02/16/2017 1501   KETONESUR NEGATIVE 01/19/2020 1402   PROTEINUR 100 (A) 01/19/2020 1402   UROBILINOGEN 0.2 02/16/2017 1501   UROBILINOGEN 0.2 03/29/2014 1259   NITRITE NEGATIVE 01/19/2020 1402   LEUKOCYTESUR TRACE (A) 01/19/2020 1402   Sepsis Labs: @LABRCNTIP (procalcitonin:4,lacticidven:4)  Recent Results (from the past 240 hour(s))  Culture, blood (Routine X 2) w Reflex to ID Panel     Status: None (Preliminary result)   Collection Time: 07/22/21  2:44 PM   Specimen: Right Antecubital; Blood  Result Value Ref Range Status   Specimen Description RIGHT ANTECUBITAL  Final   Special Requests   Final    BOTTLES DRAWN AEROBIC AND ANAEROBIC Blood Culture adequate volume   Culture   Final    NO GROWTH 2 DAYS Performed at [facility], [address], [place], [place] [address]    Report Status PENDING  Incomplete  Culture, blood (Routine X 2) w Reflex to ID Panel     Status: None (Preliminary result)   Collection Time: 07/22/21  2:46 PM   Specimen: BLOOD RIGHT HAND  Result Value Ref Range Status   Specimen Description BLOOD RIGHT HAND  Final   Special Requests   Final    BOTTLES DRAWN AEROBIC AND ANAEROBIC Blood Culture adequate volume   Culture   Final    NO GROWTH 2 DAYS Performed at [facility], [address], [place], [place] [address]    Report Status PENDING  Incomplete         Radiology Studies last 96 hours: DG Chest Port 1 View  Result Date: 07/21/2021 CLINICAL DATA:  Femoral fracture. Medical clearance for surgical repair. EXAM: PORTABLE CHEST 1 VIEW COMPARISON:  None Available. FINDINGS: Lung volumes are small, but are symmetric. Interstitial opacity at the left lung base may represent atelectasis or infiltrate. No pneumothorax or pleural effusion. Cardiac size within normal limits. No acute bone abnormality. IMPRESSION:  Pulmonary hypoinflation.  Left basilar atelectasis or infiltrate. Electronically Signed   By: Fidela Salisbury M.D.   On: 07/21/2021 21:15   DG Knee Complete 4 Views Left  Result Date: 07/21/2021 CLINICAL DATA:  Fall, left knee pain EXAM: LEFT KNEE - COMPLETE 4+ VIEW COMPARISON:  None Available. FINDINGS: Prior below the knee amputation. There is a comminuted fracture in the distal left femoral metaphysis. Mildly displaced fracture fragments anteriorly. Diffuse osteopenia. IMPRESSION: Prior below-knee amputation. Comminuted, mildly displaced distal left femoral fracture. Electronically Signed   By: Rolm Baptise M.D.   On: 07/21/2021 18:25  LOS: 2 days    Time spent: 35 min   Emeterio Reeve, DO [facility] 07/24/2021, 4:12 PM   Staff may message me via secure chat in [place]  but this may not receive immediate response,  please page for urgent matters!  If 7PM-7AM, please contact night-coverage [email or web address]  Dictation software was used to generate the above note. Typos may occur and escape review, as with typed/written notes. Please contact Dr Sheppard Coil directly for clarity if needed.

## 2021-07-24 NOTE — Plan of Care (Signed)

## 2021-07-24 NOTE — Progress Notes (Signed)
PT Cancellation Note  Patient Details Name: MARYBETH DANDY MRN: [id number] DOB: 09-07-1965   Cancelled Treatment:    Reason Eval/Treat Not Completed: Patient declined to participate with PT services this date secondary to pain.  Will attempt to see pt at a future date/time as medically appropriate.     Linus Salmons PT, DPT 07/24/21, 4:33 PM

## 2021-07-24 NOTE — TOC Progression Note (Signed)
Transition of Care [facility]) - Progression Note    Patient Details  Name: [name] MRN: [id number] Date of Birth: May 09, 1965  Transition of Care [facility]) CM/SW [place], RN Phone Number: 07/24/2021, 4:04 PM  Clinical Narrative:     Obtained PASSR number [id number]  Expected Discharge Plan: [place] Barriers to Discharge: Continued Medical Work up  Expected Discharge Plan and Services Expected Discharge Plan: [place]   Discharge Planning Services: CM Consult   Living arrangements for the past 2 months: Single Family Home                 DME Arranged: N/A                     Social Determinants of Health (SDOH) Interventions    Readmission Risk Interventions     View : No data to display.

## 2021-07-25 DIAGNOSIS — E1129 Type 2 diabetes mellitus with other diabetic kidney complication: Secondary | ICD-10-CM

## 2021-07-25 DIAGNOSIS — S72352A Displaced comminuted fracture of shaft of left femur, initial encounter for closed fracture: Secondary | ICD-10-CM | POA: Diagnosis not present

## 2021-07-25 DIAGNOSIS — S72492A Other fracture of lower end of left femur, initial encounter for closed fracture: Secondary | ICD-10-CM | POA: Diagnosis not present

## 2021-07-25 DIAGNOSIS — F419 Anxiety disorder, unspecified: Secondary | ICD-10-CM | POA: Diagnosis not present

## 2021-07-25 DIAGNOSIS — D72829 Elevated white blood cell count, unspecified: Secondary | ICD-10-CM

## 2021-07-25 DIAGNOSIS — N186 End stage renal disease: Secondary | ICD-10-CM | POA: Diagnosis not present

## 2021-07-25 LAB — CBC WITH DIFFERENTIAL/PLATELET
Abs Immature Granulocytes: 0.64 10*3/uL — ABNORMAL HIGH (ref 0.00–0.07)
Basophils Absolute: 0.1 10*3/uL (ref 0.0–0.1)
Basophils Relative: 1 %
Eosinophils Absolute: 0.5 10*3/uL (ref 0.0–0.5)
Eosinophils Relative: 3 %
HCT: 24 % — ABNORMAL LOW (ref 36.0–46.0)
Hemoglobin: 7.6 g/dL — ABNORMAL LOW (ref 12.0–15.0)
Immature Granulocytes: 4 %
Lymphocytes Relative: 16 %
Lymphs Abs: 2.5 10*3/uL (ref 0.7–4.0)
MCH: 29.1 pg (ref 26.0–34.0)
MCHC: 31.7 g/dL (ref 30.0–36.0)
MCV: 92 fL (ref 80.0–100.0)
Monocytes Absolute: 1.8 10*3/uL — ABNORMAL HIGH (ref 0.1–1.0)
Monocytes Relative: 11 %
Neutro Abs: 10.5 10*3/uL — ABNORMAL HIGH (ref 1.7–7.7)
Neutrophils Relative %: 65 %
Platelets: 287 10*3/uL (ref 150–400)
RBC: 2.61 MIL/uL — ABNORMAL LOW (ref 3.87–5.11)
RDW: 16.4 % — ABNORMAL HIGH (ref 11.5–15.5)
WBC: 16.1 10*3/uL — ABNORMAL HIGH (ref 4.0–10.5)
nRBC: 0 % (ref 0.0–0.2)

## 2021-07-25 LAB — CBC
HCT: 23.8 % — ABNORMAL LOW (ref 36.0–46.0)
Hemoglobin: 7.6 g/dL — ABNORMAL LOW (ref 12.0–15.0)
MCH: 29.5 pg (ref 26.0–34.0)
MCHC: 31.9 g/dL (ref 30.0–36.0)
MCV: 92.2 fL (ref 80.0–100.0)
Platelets: 286 10*3/uL (ref 150–400)
RBC: 2.58 MIL/uL — ABNORMAL LOW (ref 3.87–5.11)
RDW: 16.5 % — ABNORMAL HIGH (ref 11.5–15.5)
WBC: 16.8 10*3/uL — ABNORMAL HIGH (ref 4.0–10.5)
nRBC: 0 % (ref 0.0–0.2)

## 2021-07-25 LAB — GLUCOSE, CAPILLARY
Glucose-Capillary: 236 mg/dL — ABNORMAL HIGH (ref 70–99)
Glucose-Capillary: 269 mg/dL — ABNORMAL HIGH (ref 70–99)
Glucose-Capillary: 345 mg/dL — ABNORMAL HIGH (ref 70–99)
Glucose-Capillary: 350 mg/dL — ABNORMAL HIGH (ref 70–99)
Glucose-Capillary: 371 mg/dL — ABNORMAL HIGH (ref 70–99)

## 2021-07-25 LAB — BASIC METABOLIC PANEL
Anion gap: 12 (ref 5–15)
BUN: 59 mg/dL — ABNORMAL HIGH (ref 6–20)
CO2: 27 mmol/L (ref 22–32)
Calcium: 8.7 mg/dL — ABNORMAL LOW (ref 8.9–10.3)
Chloride: 94 mmol/L — ABNORMAL LOW (ref 98–111)
Creatinine, Ser: 7.28 mg/dL — ABNORMAL HIGH (ref 0.44–1.00)
GFR, Estimated: 6 mL/min — ABNORMAL LOW (ref >60)
Glucose, Bld: 220 mg/dL — ABNORMAL HIGH (ref 70–99)
Potassium: 3.8 mmol/L (ref 3.5–5.1)
Sodium: 133 mmol/L — ABNORMAL LOW (ref 135–145)

## 2021-07-25 LAB — PATHOLOGIST SMEAR REVIEW

## 2021-07-25 MED ORDER — POLYETHYLENE GLYCOL 3350 17 G PO PACK: 17.0000 g | PACK | Freq: Every day | ORAL | 0 refills | Status: DC | PRN

## 2021-07-25 MED ORDER — PANTOPRAZOLE SODIUM 40 MG PO TBEC: 40.0000 mg | DELAYED_RELEASE_TABLET | Freq: Every day | ORAL | 0 refills | Status: DC

## 2021-07-25 MED ORDER — NICOTINE 21 MG/24HR TD PT24: 21.0000 mg | MEDICATED_PATCH | Freq: Every day | TRANSDERMAL | 0 refills | Status: DC | PRN

## 2021-07-25 MED ORDER — OXYCODONE-ACETAMINOPHEN 7.5-325 MG PO TABS: 1.0000 | ORAL_TABLET | Freq: Four times a day (QID) | ORAL | 0 refills | Status: DC | PRN

## 2021-07-25 NOTE — Progress Notes (Signed)
Physical Therapy Treatment Patient Details Name: [name] MRN: [id number] DOB: [birth year] Today's Date: 07/25/2021   History of Present Illness Pt is a 56 y/o F admitted on 07/21/21 after presenting to ED for c/c of pain from falling during transfer from w/c to recliner ~1 week ago & pt missed dialysis for a whole week. Pt found to ahve distal L femur fx & deemed no surgery by orthopedics but NWB LLE. PMH: HTN, HLD, ESRD on HD MWF, L BKA in 2021, depression, anxiety, insulin-dependent DM, insomnia, chronic pain syndromes    PT Comments    Pt was long sitting in bed upon arriving. She was pre-medicated prior to session and agreeable with max encouragement. Session included, rolling to R side > short sit with mod assist. Sat EOB x several minutes prior to standing to RW 3 x from elevated bed height. Pt was unwilling to attempt ambulation/ "hopping" to recliner. Pt is somewhat self limiting. Encouraged her to perform there ex and strengthening throughout the day even in bed. Pt will require SNF at DC to address deficits while maximizing independence with ADLs.    Recommendations for follow up therapy are one component of a multi-disciplinary discharge planning process, led by the attending physician.  Recommendations may be updated based on patient status, additional functional criteria and insurance authorization.  Follow Up Recommendations  Skilled nursing-short term rehab (<3 hours/day)     Assistance Recommended at Discharge Frequent or constant Supervision/Assistance  Patient can return home with the following Help with stairs or ramp for entrance;Assist for transportation;Assistance with cooking/housework;A lot of help with walking and/or transfers;A lot of help with bathing/dressing/bathroom;Direct supervision/assist for medications management;Direct supervision/assist for financial management   Equipment Recommendations  None recommended by PT       Precautions / Restrictions  Precautions Precautions: Fall Restrictions Weight Bearing Restrictions: Yes LLE Weight Bearing: Non weight bearing     Mobility  Bed Mobility Overal bed mobility: Needs Assistance Bed Mobility: Rolling, Supine to Sit, Sit to Supine Rolling: Min assist   Supine to sit: Mod assist Sit to supine: Mod assist   Transfers Overall transfer level: Needs assistance Equipment used: Rolling walker (2 wheels) Transfers: Sit to/from Stand Sit to Stand: Min assist, Mod assist, From elevated surface  General transfer comment: pt performed STS 3 x EOB prior to returning to supine in bed.    Ambulation/Gait    General Gait Details: unwilling to attempt    Balance Overall balance assessment: Needs assistance Sitting-balance support: Bilateral upper extremity supported, Feet supported Sitting balance-Leahy Scale: Good     Standing balance support: Bilateral upper extremity supported, During functional activity, Reliant on assistive device for balance Standing balance-Leahy Scale: Fair       Cognition Arousal/Alertness: Awake/alert Behavior During Therapy: WFL for tasks assessed/performed Overall Cognitive Status: No family/caregiver present to determine baseline cognitive functioning        General Comments: Pt is A and O x4           General Comments General comments (skin integrity, edema, etc.): reviewed importance of pt performing ther ex to promote return in function and strength. she states understanding but will need encouragement to actually perform.      Pertinent Vitals/Pain Pain Assessment Pain Assessment: 0-10 Pain Score: 7  Faces Pain Scale: Hurts little more Pain Location: L LE Pain Descriptors / Indicators: Discomfort, Spasm Pain Intervention(s): Limited activity within patient's tolerance, Monitored during session, Premedicated before session, Repositioned     PT Goals (current  goals can now be found in the care plan section) Acute Rehab PT Goals Patient  Stated Goal: decreased pain Progress towards PT goals: Progressing toward goals    Frequency    7X/week      PT Plan Current plan remains appropriate       AM-PAC PT "6 Clicks" Mobility   Outcome Measure  Help needed turning from your back to your side while in a flat bed without using bedrails?: A Lot Help needed moving from lying on your back to sitting on the side of a flat bed without using bedrails?: A Lot Help needed moving to and from a bed to a chair (including a wheelchair)?: A Lot Help needed standing up from a chair using your arms (e.g., wheelchair or bedside chair)?: Total Help needed to walk in hospital room?: Total Help needed climbing 3-5 steps with a railing? : Total 6 Click Score: 9    End of Session   Activity Tolerance: Patient tolerated treatment well Patient left: in bed;with bed alarm set;with call bell/phone within reach;with nursing/sitter in room Nurse Communication: Mobility status PT Visit Diagnosis: Difficulty in walking, not elsewhere classified (R26.2);Other abnormalities of gait and mobility (R26.89);Muscle weakness (generalized) (M62.81);Other (comment);Pain Pain - Right/Left: Left Pain - part of body: Leg     Time: 1005-1016 PT Time Calculation (min) (ACUTE ONLY): 11 min  Charges:  $Therapeutic Activity: 8-22 mins                     Julaine Fusi PTA 07/25/21, 12:50 PM

## 2021-07-25 NOTE — Discharge Summary (Signed)
Physician Discharge Summary   Patient: [name] MRN: [id number] DOB: [birth year]  Admit date:     07/21/2021  Discharge date: 07/25/21  Discharge Physician: Emeterio Reeve   PCP: Julian Hy, PA-C   Recommendations at discharge:   PT per SNF protocol / orthopedics instructions.  Follow in 1 weeks w/ orthopedics.  Follow as directed for dialysis. Recommend monitor CBC/BMP per nephrology.  See PCP or attending provider at SNF in 1 weeks, would recheck CBC and BMP at that time.   Discharge Diagnoses: Principal Problem:   Femur fracture, left ([place]) Active Problems:   TOBACCO DEPENDENCE   Essential hypertension, benign   Anxiety and depression   Insomnia   Type 2 diabetes mellitus with renal complication (HCC)   Obesity (BMI 30.0-34.9)   Hyponatremia   ESRD on dialysis ([place])   Leukocytosis   Essential hypertension   History of CVA (cerebrovascular accident) without residual deficits   Hyperkalemia   Metabolic acidemia   Closed fracture of left distal femur ([place])   Uncontrolled type 2 diabetes mellitus with hypoglycemia, with long-term current use of insulin [facility])   Brief Narrative / Hospital Course:  [name] is a 56 year old female with history of hypertension, hyperlipidemia, end-stage renal disease on HD MWF, history of left BKA in 2021, depression, anxiety, insulin-dependent diabetes mellitus, insomnia, chronic pain syndrome, who presents emergency department for chief concerns of pain from falling during transfer from wheelchair to recliner about 1 week prior to admission. She missed dialysis for whole week.  Upon arriving the hospital 07/21/21, she was found to have a distal left femur fracture, deemed no surgery by orthopedics.  But no weightbearing. Iniitally planning for inpatient rehab. As of 07/24/21 waiting on patient to decide on SNF facility for rehab since she was initially advised inpatient rehab but later stated she did not think she would  be able to participate in intensive therapy.    Consultants:  Nephrology Orthopedics   Procedures: Dialysis per routine     Assessment and Plan: * Femur fracture, left [facility]) Orthopedic service has been consulted by EDP, who recommends no surgery while inpatient Orthopedic service recommends brace in flexion of the left knee, nonweightbearing Patient to follow-up outpatient with orthopedic provider in 1 week after discharge Pending rehab/SNF placement at this time   Hyperkalemia - Presumed secondary to missing hemodialysis session - Status post Veltassa, insulin 5 units, dextrose 1 amp, sodium bicarbonate injection - Nephrology has been consulted and recommended the above - veltassa d/c today   Essential hypertension - Amlodipine 5 mg daily, carvedilol 6.25 mg p.o. twice daily -Hydralazine 5 mg IV every 6 hours.  For SBP greater than 180, 3 doses ordered  Leukocytosis WBC up and neutrophilia seems more likely reactive/inflammatory, no s/s infection, still trending down compared to admission  Would repeat in 1 week Further eval sooner if fever, cough/SOB, pain other concern for infection  ESRD on dialysis [facility]) - Nephrology following  Anxiety and depression - Amitriptyline 25 mg nightly, Celexa 20 mg daily, duloxetine 60 mg daily  Essential hypertension, benign - Patient takes amlodipine 5 mg daily, carvedilol 6.25 mg p.o. twice daily, these have been resumed for 07/22/2021 due to low normotensive in setting of requiring hemodialysis - Hydralazine 100 mg 3 times daily have not been resumed due to the same  TOBACCO DEPENDENCE - Nicotine patch as needed for nicotine craving ordered        Pain control - [name] Controlled Substance Reporting System database was  reviewed. and patient was instructed, not to drive, operate heavy machinery, perform activities at heights, swimming or participation in water activities or provide baby-sitting services while on Pain, Sleep  and Anxiety Medications; until their outpatient Physician has advised to do so again. Also recommended to not to take more than prescribed Pain, Sleep and Anxiety Medications.   Disposition: Skilled nursing facility Diet recommendation:  Discharge Diet Orders (From admission, onward)     Start     Ordered   07/25/21 0000  Diet - low sodium heart healthy        07/25/21 1450           Renal diet DISCHARGE MEDICATION: Allergies as of 07/25/2021       Reactions   Trazodone Swelling   Latex Rash   Lidocaine Itching        Medication List     STOP taking these medications    NovoLOG Mix 70/30 FlexPen (70-30) 100 UNIT/ML FlexPen Generic drug: insulin aspart protamine - aspart   oxyCODONE 5 MG immediate release tablet Commonly known as: Oxy IR/ROXICODONE       TAKE these medications    alosetron 1 MG tablet Commonly known as: LOTRONEX Take 1 mg by mouth 2 (two) times daily.   amitriptyline 25 MG tablet Commonly known as: ELAVIL Take 25 mg by mouth at bedtime.   amLODipine 5 MG tablet Commonly known as: NORVASC Take 1 tablet (5 mg total) by mouth daily.   atorvastatin 40 MG tablet Commonly known as: LIPITOR TAKE 1 TABLET (40 MG TOTAL) BY MOUTH DAILY AT 6 PM.   Biotin 10 MG Caps Take 1 capsule by mouth daily.   carvedilol 6.25 MG tablet Commonly known as: COREG Take 1 tablet (6.25 mg total) by mouth 2 (two) times daily.   Cinnamon 500 MG capsule Take 500 mg by mouth daily.   citalopram 20 MG tablet Commonly known as: CELEXA Take 1 tablet (20 mg total) by mouth daily.   cyclobenzaprine 10 MG tablet Commonly known as: FLEXERIL Take 1 tablet by mouth 3 (three) times daily as needed. What changed: Another medication with the same name was removed. Continue taking this medication, and follow the directions you see here.   D3-50 1.25 MG (50000 UT) capsule Generic drug: Cholecalciferol Take 50,000 Units by mouth as directed. Monday, wed, Friday    DULoxetine 60 MG capsule Commonly known as: CYMBALTA Take 60 mg by mouth daily.   hydrALAZINE 100 MG tablet Commonly known as: APRESOLINE Take 1 tablet (100 mg total) by mouth 3 (three) times daily.   insulin glargine 100 UNIT/ML injection Commonly known as: LANTUS Inject 0.12 mLs (12 Units total) into the skin at bedtime.   iron sucrose in sodium chloride 0.9 % 100 mL Iron Sucrose (Venofer)   Muscle Rub 10-15 % Crea Apply 1 application topically as needed for muscle pain.   naloxone 4 MG/0.1ML Liqd nasal spray kit Commonly known as: NARCAN 1 spray as directed.   nicotine 21 mg/24hr patch Commonly known as: NICODERM CQ - dosed in mg/24 hours Place 1 patch (21 mg total) onto the skin daily as needed (nicotine craving).   oxyCODONE-acetaminophen 7.5-325 MG tablet Commonly known as: PERCOCET Take 1 tablet by mouth every 6 (six) hours as needed for moderate pain or severe pain.   pantoprazole 40 MG tablet Commonly known as: PROTONIX Take 1 tablet (40 mg total) by mouth daily. Start taking on: Jul 26, 2021   polyethylene glycol 17 g packet Commonly  known as: MIRALAX / GLYCOLAX Take 17 g by mouth daily as needed for mild constipation.   sevelamer carbonate 800 MG tablet Commonly known as: RENVELA Take 2 tablets (1,600 mg total) by mouth 3 (three) times daily with meals.   sodium chloride 0.65 % Soln nasal spray Commonly known as: OCEAN Place 1 spray into both nostrils as needed for congestion (nose irritation).               Discharge Care Instructions  (From admission, onward)           Start     Ordered   07/25/21 0000  No dressing needed        07/25/21 1450            Contact information for after-discharge care     [facility] .   Service: Skilled Nursing Contact information: [place] [phone]                    Discharge Exam: Danley Danker  Weights   07/22/21 1327 07/24/21 0904 07/24/21 1235  Weight: 83.2 kg 82.2 kg 82 kg   Constitutional:  VS as above General Appearance: alert, well-developed, well-nourished, NAD Eyes: Normal lids and conjunctive, non-icteric sclera Ears, Nose, Mouth, Throat: Normal appearance MMM Neck: No masses, trachea midline Respiratory: Normal respiratory effort Breath sounds normal, no wheeze/rhonchi/rales Cardiovascular: S1/S2 normal, RRR No lower extremity edema on RLE Gastrointestinal: Nontender, no masses No hepatomegaly, no splenomegaly No hernia appreciated Musculoskeletal:  No clubbing/cyanosis of digits Neurological: No cranial nerve deficit on limited exam Motor and sensation intact and symmetric Psychiatric: Normal judgment/insight Normal mood and affect  Condition at discharge: good  The results of significant diagnostics from this hospitalization (including imaging, microbiology, ancillary and laboratory) are listed below for reference.   Imaging Studies: DG Chest Port 1 View  Result Date: 07/21/2021 CLINICAL DATA:  Femoral fracture. Medical clearance for surgical repair. EXAM: PORTABLE CHEST 1 VIEW COMPARISON:  None Available. FINDINGS: Lung volumes are small, but are symmetric. Interstitial opacity at the left lung base may represent atelectasis or infiltrate. No pneumothorax or pleural effusion. Cardiac size within normal limits. No acute bone abnormality. IMPRESSION: Pulmonary hypoinflation.  Left basilar atelectasis or infiltrate. Electronically Signed   By: Fidela Salisbury M.D.   On: 07/21/2021 21:15   DG Knee Complete 4 Views Left  Result Date: 07/21/2021 CLINICAL DATA:  Fall, left knee pain EXAM: LEFT KNEE - COMPLETE 4+ VIEW COMPARISON:  None Available. FINDINGS: Prior below the knee amputation. There is a comminuted fracture in the distal left femoral metaphysis. Mildly displaced fracture fragments anteriorly. Diffuse osteopenia. IMPRESSION: Prior below-knee  amputation. Comminuted, mildly displaced distal left femoral fracture. Electronically Signed   By: Rolm Baptise M.D.   On: 07/21/2021 18:25    Microbiology: Results for orders placed or performed during the hospital encounter of 07/21/21  Culture, blood (Routine X 2) w Reflex to ID Panel     Status: None (Preliminary result)   Collection Time: 07/22/21  2:44 PM   Specimen: Right Antecubital; Blood  Result Value Ref Range Status   Specimen Description RIGHT ANTECUBITAL  Final   Special Requests   Final    BOTTLES DRAWN AEROBIC AND ANAEROBIC Blood Culture adequate volume   Culture   Final    NO GROWTH 3 DAYS Performed at [facility], [address] [place], [place] [address]    Report  Status PENDING  Incomplete  Culture, blood (Routine X 2) w Reflex to ID Panel     Status: None (Preliminary result)   Collection Time: 07/22/21  2:46 PM   Specimen: BLOOD RIGHT HAND  Result Value Ref Range Status   Specimen Description BLOOD RIGHT HAND  Final   Special Requests   Final    BOTTLES DRAWN AEROBIC AND ANAEROBIC Blood Culture adequate volume   Culture   Final    NO GROWTH 3 DAYS Performed at [facility], [address] [place], [place] [address]    Report Status PENDING  Incomplete    Labs: CBC: Recent Labs  Lab 07/21/21 1729 07/22/21 0256 07/23/21 0418 07/25/21 0509  WBC 17.2* 19.1* 13.4* 16.1*  16.8*  NEUTROABS  --   --   --  10.5*  HGB 9.0* 8.4* 7.8* 7.6*  7.6*  HCT 28.0* 25.4* 24.2* 24.0*  23.8*  MCV 89.7 87.6 88.3 92.0  92.2  PLT 277 256 249 287  998   Basic Metabolic Panel: Recent Labs  Lab 07/21/21 1729 07/22/21 0256 07/23/21 1127 07/25/21 0509  NA 127* 129* 132* 133*  K 6.1* 5.7* 4.4 3.8  CL 89* 87* 92* 94*  CO2 13* 15* 23 27  GLUCOSE 126* 98 210* 220*  BUN 160* 156* 89* 59*  CREATININE 14.43* 14.55* 9.20* 7.28*  CALCIUM 8.3* 8.1* 8.3* 8.7*   Liver Function Tests: Recent Labs  Lab 07/21/21 1729  AST 12*  ALT 8  ALKPHOS 192*   BILITOT 1.3*  PROT 7.7  ALBUMIN 2.9*   CBG: Recent Labs  Lab 07/24/21 1256 07/24/21 1643 07/24/21 2131 07/25/21 0743 07/25/21 1133  GLUCAP 151* 261* 258* 236* 269*    Discharge time spent: greater than 30 minutes.  Signed: Emeterio Reeve, DO [facility] 07/25/2021

## 2021-07-25 NOTE — TOC Progression Note (Signed)
Transition of Care [facility]) - Progression Note    Patient Details  Name: SIMARA RHYNER MRN: [id number] Date of Birth: 1966-03-03  Transition of Care [facility]) CM/SW [place], RN Phone Number: 07/25/2021, 1:01 PM  Clinical Narrative:    Spoke with the patient's daughter, Explained the DC plan and that she will go to Dialysis in [facility] She stated understanding   Expected Discharge Plan: [place] Barriers to Discharge: Continued Medical Work up  Expected Discharge Plan and Services Expected Discharge Plan: [place]   Discharge Planning Services: CM Consult   Living arrangements for the past 2 months: Single Family Home                 DME Arranged: N/A                     Social Determinants of Health (SDOH) Interventions    Readmission Risk Interventions     View : No data to display.

## 2021-07-25 NOTE — Progress Notes (Signed)
Inpatient Diabetes Program Recommendations  AACE/ADA: New Consensus Statement on Inpatient Glycemic Control (2015)  Target Ranges:  Prepandial:   less than 140 mg/dL      Peak postprandial:   less than 180 mg/dL (1-2 hours)      Critically ill patients:  140 - 180 mg/dL   Lab Results  Component Value Date   GLUCAP 236 (H) 07/25/2021   HGBA1C 7.1 (H) 07/22/2021    Review of Glycemic Control  Latest Reference Range & Units 07/24/21 08:51 07/24/21 12:56 07/24/21 16:43 07/24/21 21:31 07/25/21 07:43  Glucose-Capillary 70 - 99 mg/dL 179 (H) 151 (H) 261 (H) 258 (H) 236 (H)   Diabetes history: DM 2 Outpatient Diabetes medications: Lantus 12 QHS + Novolog 70/30 Insulin 10 units TID Current orders for Inpatient glycemic control:  Semglee 6 units qhs Novolog 0-6 units tid  Inpatient Diabetes Program Recommendations:    -  consider increasing Semglee to 8 units  Thanks,  Tama Headings RN, MSN, BC-ADM Inpatient Diabetes Coordinator Team Pager [phone] (8a-5p)

## 2021-07-25 NOTE — Progress Notes (Signed)
[facility]  ROUNDING NOTE   Subjective:   Patient seen resting quietly, eating crackers Alert and oriented Tolerating meals without nausea and vomiting Denies shortness of breath Denies pain and discomfort   Objective:  Vital signs in last 24 hours:  Temp:  [97.9 F (36.6 C)-99 F (37.2 C)] 98.4 F (36.9 C) (05/25 1132) Pulse Rate:  [78-89] 82 (05/25 1132) Resp:  [15-17] 15 (05/25 1132) BP: (128-152)/(48-68) 152/61 (05/25 1132) SpO2:  [94 %-97 %] 96 % (05/25 1132)  Weight change:  Filed Weights   07/22/21 1327 07/24/21 0904 07/24/21 1235  Weight: 83.2 kg 82.2 kg 82 kg    Intake/Output: I/O last 3 completed shifts: In: 480 [P.O.:480] Out: 1396 [Other:1396]   Intake/Output this shift:  Total I/O In: 240 [P.O.:240] Out: -   Physical Exam: General: NAD  Head: Normocephalic, atraumatic. Moist oral mucosal membranes  Eyes: Anicteric  Lungs:  Clear to auscultation,normal effort  Heart: Regular rate and rhythm  Abdomen:  Soft, nontender  Extremities:  No peripheral edema. Lt BKA  Neurologic: Nonfocal, moving all four extremities  Skin: No lesions  Access: Lt AVF    Basic Metabolic Panel: Recent Labs  Lab 07/21/21 1729 07/22/21 0256 07/23/21 1127 07/25/21 0509  NA 127* 129* 132* 133*  K 6.1* 5.7* 4.4 3.8  CL 89* 87* 92* 94*  CO2 13* 15* 23 27  GLUCOSE 126* 98 210* 220*  BUN 160* 156* 89* 59*  CREATININE 14.43* 14.55* 9.20* 7.28*  CALCIUM 8.3* 8.1* 8.3* 8.7*     Liver Function Tests: Recent Labs  Lab 07/21/21 1729  AST 12*  ALT 8  ALKPHOS 192*  BILITOT 1.3*  PROT 7.7  ALBUMIN 2.9*    No results for input(s): LIPASE, AMYLASE in the last 168 hours. No results for input(s): AMMONIA in the last 168 hours.  CBC: Recent Labs  Lab 07/21/21 1729 07/22/21 0256 07/23/21 0418 07/25/21 0509  WBC 17.2* 19.1* 13.4* 16.1*  16.8*  NEUTROABS  --   --   --  10.5*  HGB 9.0* 8.4* 7.8* 7.6*  7.6*  HCT 28.0* 25.4* 24.2* 24.0*  23.8*   MCV 89.7 87.6 88.3 92.0  92.2  PLT 277 256 249 287  286     Cardiac Enzymes: No results for input(s): CKTOTAL, CKMB, CKMBINDEX, TROPONINI in the last 168 hours.  BNP: Invalid input(s): POCBNP  CBG: Recent Labs  Lab 07/24/21 1256 07/24/21 1643 07/24/21 2131 07/25/21 0743 07/25/21 1133  GLUCAP 151* 261* 258* 236* 269*     Microbiology: Results for orders placed or performed during the hospital encounter of 07/21/21  Culture, blood (Routine X 2) w Reflex to ID Panel     Status: None (Preliminary result)   Collection Time: 07/22/21  2:44 PM   Specimen: Right Antecubital; Blood  Result Value Ref Range Status   Specimen Description RIGHT ANTECUBITAL  Final   Special Requests   Final    BOTTLES DRAWN AEROBIC AND ANAEROBIC Blood Culture adequate volume   Culture   Final    NO GROWTH 3 DAYS Performed at [facility], [address], [place], [place] [address]    Report Status PENDING  Incomplete  Culture, blood (Routine X 2) w Reflex to ID Panel     Status: None (Preliminary result)   Collection Time: 07/22/21  2:46 PM   Specimen: BLOOD RIGHT HAND  Result Value Ref Range Status   Specimen Description BLOOD RIGHT HAND  Final   Special Requests  Final    BOTTLES DRAWN AEROBIC AND ANAEROBIC Blood Culture adequate volume   Culture   Final    NO GROWTH 3 DAYS Performed at [facility], [address] [place], [place] [address]    Report Status PENDING  Incomplete    Coagulation Studies: No results for input(s): LABPROT, INR in the last 72 hours.  Urinalysis: No results for input(s): COLORURINE, LABSPEC, PHURINE, GLUCOSEU, HGBUR, BILIRUBINUR, KETONESUR, PROTEINUR, UROBILINOGEN, NITRITE, LEUKOCYTESUR in the last 72 hours.  Invalid input(s): APPERANCEUR    Imaging: No results found.   Medications:     amitriptyline  25 mg Oral QHS   amLODipine  5 mg Oral Daily   atorvastatin  40 mg Oral q1800   carvedilol  6.25 mg Oral BID    Chlorhexidine Gluconate Cloth  6 each Topical Q0600   citalopram  20 mg Oral Daily   DULoxetine  60 mg Oral Daily   epoetin (EPOGEN/PROCRIT) injection  10,000 Units Intravenous Q M,W,F-HD   heparin  5,000 Units Subcutaneous Q8H   insulin aspart  0-6 Units Subcutaneous TID WC   insulin glargine-yfgn  6 Units Subcutaneous QHS   pantoprazole  40 mg Oral Daily   sevelamer carbonate  1,600 mg Oral TID WC   cyclobenzaprine, HYDROmorphone (DILAUDID) injection, nicotine, ondansetron **OR** ondansetron (ZOFRAN) IV, oxyCODONE-acetaminophen, polyethylene glycol  Assessment/ Plan:  [name] is a 56 y.o.  female with past medical history including hyperlipidemia, hypertension, left BKA, diabetes, chronic pain syndrome, and end-stage renal disease on hemodialysis, who was admitted to [facility] on 07/21/2021 for Acidosis [E87.20] Hyperkalemia [E87.5] Elevated BUN [R79.9] ESRD (end stage renal disease) on dialysis ([place]) [N18.6, Z99.2] Femur fracture, left ([facility]) [S72.92XA] Closed fracture of left femur, unspecified fracture morphology, unspecified portion of femur, initial encounter ([place]) [S72.92XA] Closed fracture of left distal femur ([place]) [S72.402A]  [facility]/MWF/left aVF/83 kg  End-stage renal disease on dialysis.  Will maintain outpatient schedule if possible.    Received dialysis yesterday, UF 1.3L achieved. Treatment terminated early due to increased venous pressures. Offered to have access evaluated by vascular or ultrasound, patient refused. Requested to access during treatment tomorrow, states she has no issues with access in outpatient clinic. Monitoring discharge plan to determine changes to outpatient clinic.  2. Anemia of chronic kidney disease Normocytic Lab Results  Component Value Date   HGB 7.6 (L) 07/25/2021   HGB 7.6 (L) 07/25/2021    EPO 10000 unit IV with dialysis.   3. Secondary Hyperparathyroidism:  Lab Results  Component Value Date   PTH 44 01/11/2020   PTH  Comment 01/11/2020   CALCIUM 8.7 (L) 07/25/2021   CAION 1.10 (L) 04/07/2017   PHOS 5.1 (H) 02/20/2020     Calcium and phosphorus within acceptable limits. Continue Sevelamer with meals.  4.  Hypertension with chronic kidney disease.  Home regimen includes amlodipine, carvedilol and hydralazine.  Currently receiving these medications.  Blood pressure 152/61 today  5. Diabetes mellitus type II with chronic kidney disease: insulin dependent. Home regimen includes to send NovoLog 70/30. Most recent hemoglobin A1c is 6.2 on 12/29/19.           Primary team to manage SSI   LOS: 3 [place] 5/25/20231:16 PM

## 2021-07-25 NOTE — Progress Notes (Signed)
OT Cancellation Note  Patient Details Name: [name] MRN: [id number] DOB: June 22, 1965   Cancelled Treatment:    Reason Eval/Treat Not Completed: Patient declined, no reason specified. Pt declined OT this afternoon, wanting to keep the "cramps" in her LLE at [place]. Will re-attempt at later date/time as able.   Ardeth Perfect., MPH, MS, OTR/L ascom [phone] 07/25/21, 2:46 PM

## 2021-07-25 NOTE — Progress Notes (Signed)
07/24/21 0930 07/24/21 0945 07/24/21 1000  Vitals  BP (!) 157/54  --   --   MAP (mmHg) 82  --   --   Pulse Rate 80  --   --   ECG Heart Rate 81  --   --   Resp 15  --   --   During Hemodialysis Assessment  Blood Flow Rate (mL/min) 400 mL/min 400 mL/min 400 mL/min  Arterial Pressure (mmHg) -190 mmHg -190 mmHg -190 mmHg  Venous Pressure (mmHg) 230 mmHg 230 mmHg 230 mmHg  Transmembrane Pressure (mmHg) 70 mmHg 70 mmHg 70 mmHg  Ultrafiltration Rate (mL/min) 830 mL/min 830 mL/min 830 mL/min  Dialysate Flow Rate (mL/min) 500 ml/min 500 ml/min 500 ml/min  Conductivity: Machine  13.8 13.8 13.8  HD Safety Checks Performed Yes Yes Yes  Intra-Hemodialysis Comments Tx initiated Progressing as prescribed;Tolerated well Progressing as prescribed;Tolerated well    07/24/21 1015 07/24/21 1030 07/24/21 1045  Vitals  BP  --  (!) 164/59 (!) 161/61  MAP (mmHg)  --  87 88  Pulse Rate  --  80 93  ECG Heart Rate  --  81 94  Resp  --  15 14  During Hemodialysis Assessment  Blood Flow Rate (mL/min) 400 mL/min 400 mL/min 350 mL/min  Arterial Pressure (mmHg) -190 mmHg -190 mmHg -240 mmHg  Venous Pressure (mmHg) 230 mmHg 230 mmHg 210 mmHg  Transmembrane Pressure (mmHg) 70 mmHg 70 mmHg 70 mmHg  Ultrafiltration Rate (mL/min) 830 mL/min 830 mL/min 840 mL/min  Dialysate Flow Rate (mL/min) 500 ml/min 500 ml/min 500 ml/min  Conductivity: Machine  13.8 13.8 13.8  HD Safety Checks Performed Yes Yes Yes  Intra-Hemodialysis Comments Progressing as prescribed;Tolerated well Progressing as prescribed Progressing as prescribed;Tolerated well    07/24/21 1100 07/24/21 1115 07/24/21 1130  Vitals  BP (!) 159/62 (!) 151/72 (!) 129/57  MAP (mmHg) 85 95 71  Pulse Rate 98 96 100  ECG Heart Rate 98 99 (!) 102  Resp 17 16 16   During Hemodialysis Assessment  Blood Flow Rate (mL/min) 350 mL/min 300 mL/min 300 mL/min  Arterial Pressure (mmHg) -260 mmHg -260 mmHg -260 mmHg  Venous Pressure (mmHg) 250 mmHg 380 mmHg 320  mmHg  Transmembrane Pressure (mmHg) 70 mmHg 70 mmHg 70 mmHg  Ultrafiltration Rate (mL/min) 840 mL/min 850 mL/min 850 mL/min  Dialysate Flow Rate (mL/min) 500 ml/min 500 ml/min 500 ml/min  Conductivity: Machine  13.8 13.8 13.8  HD Safety Checks Performed Yes Yes Yes  Intra-Hemodialysis Comments Progressing as prescribed;Tolerated well Progressing as prescribed;Tolerated well Progressing as prescribed;Tolerated well    07/24/21 1145 07/24/21 1200 07/24/21 1214  Vitals  BP (!) 128/55  --   --   MAP (mmHg) 78  --   --   Pulse Rate (!) 103 (!) 103 (!) 103  ECG Heart Rate (!) 101 (!) 102 (!) 102  Resp 16 14 14   During Hemodialysis Assessment  Blood Flow Rate (mL/min) 250 mL/min 200 mL/min  --   Arterial Pressure (mmHg) -260 mmHg -240 mmHg  --   Venous Pressure (mmHg) 290 mmHg 200 mmHg  --   Transmembrane Pressure (mmHg) 70 mmHg 80 mmHg  --   Ultrafiltration Rate (mL/min) 850 mL/min 850 mL/min  --   Dialysate Flow Rate (mL/min) 500 ml/min 500 ml/min  --   Conductivity: Machine  13.8 13.8  --   HD Safety Checks Performed Yes Yes  --   Intra-Hemodialysis Comments Progressing as prescribed;Tolerated well Progressing as prescribed;Tolerated well Tx completed

## 2021-07-25 NOTE — Assessment & Plan Note (Addendum)
WBC up and neutrophilia, CXR (+) multifocal pneumonia  See A/P HCAP  For completeness will also obtain UA/UCx (makes some urine)

## 2021-07-25 NOTE — TOC Progression Note (Signed)
Transition of Care [facility]) - Progression Note    Patient Details  Name: [name] MRN: [id number] Date of Birth: 1966/02/03  Transition of Care [facility]) CM/SW [place], RN Phone Number: 07/25/2021, 12:36 PM  Clinical Narrative:     The patient chose the bed offer from [facility]< I notified Ephraim Hamburger the patient pathways coordinator will get a chair time in [place]  Expected Discharge Plan: [place] Barriers to Discharge: Continued Medical Work up  Expected Discharge Plan and Services Expected Discharge Plan: [place]   Discharge Planning Services: CM Consult   Living arrangements for the past 2 months: Single Family Home                 DME Arranged: N/A                     Social Determinants of Health (SDOH) Interventions    Readmission Risk Interventions     View : No data to display.

## 2021-07-25 NOTE — Progress Notes (Signed)
Working on transfer to [facility] for SNF placement.

## 2021-07-25 NOTE — TOC Progression Note (Addendum)
Transition of Care [facility]) - Progression Note    Patient Details  Name: JURI DINNING MRN: [id number] Date of Birth: 01/22/1966  Transition of Care [facility]) CM/SW [place], RN Phone Number: 07/25/2021, 9:03 AM  Clinical Narrative:  Spoke with the patient and provided STR SNF Bed options, I explained the only one in [place] is [facility] and provided the address, She stated she would talk to her daughter, I explained she is medically ready to DC and we plan to DC today, her option is to go to STR or to home, I explained I  would give her the time to call her daughter but we do plan to DC today, she stated understanding  Update, The patient goes to dialysis in [place], I notified Estill Bamberg the Patient Pathways Nurse and she will get the chair time changed to [facility]  Expected Discharge Plan: [place] Barriers to Discharge: Continued Medical Work up  Expected Discharge Plan and Services Expected Discharge Plan: [place]   Discharge Planning Services: CM Consult   Living arrangements for the past 2 months: Single Family Home                 DME Arranged: N/A                     Social Determinants of Health (SDOH) Interventions    Readmission Risk Interventions     View : No data to display.

## 2021-07-25 NOTE — Care Management Important Message (Signed)
Important Message  Patient Details  Name: [name] MRN: [id number] Date of Birth: January 10, 1966   Medicare Important Message Given:  Yes     Juliann Pulse A Trevione Wert 07/25/2021, 2:43 PM

## 2021-07-25 NOTE — Plan of Care (Signed)

## 2021-07-26 DIAGNOSIS — N186 End stage renal disease: Secondary | ICD-10-CM | POA: Diagnosis not present

## 2021-07-26 LAB — GLUCOSE, CAPILLARY
Glucose-Capillary: 291 mg/dL — ABNORMAL HIGH (ref 70–99)
Glucose-Capillary: 191 mg/dL — ABNORMAL HIGH (ref 70–99)
Glucose-Capillary: 347 mg/dL — ABNORMAL HIGH (ref 70–99)
Glucose-Capillary: 358 mg/dL — ABNORMAL HIGH (ref 70–99)
Glucose-Capillary: 447 mg/dL — ABNORMAL HIGH (ref 70–99)

## 2021-07-26 MED ORDER — EPOETIN ALFA 10000 UNIT/ML IJ SOLN
INTRAMUSCULAR | Status: AC
  Filled 2021-07-26: qty 1

## 2021-07-26 MED ORDER — INSULIN ASPART 100 UNIT/ML IJ SOLN
10.0000 [IU] | Freq: Once | INTRAMUSCULAR | Status: AC
  Administered 2021-07-26: 10 [IU] via SUBCUTANEOUS
  Filled 2021-07-26: qty 1

## 2021-07-26 NOTE — Progress Notes (Signed)
Hemodialysis Post Treatment Note  Jul 26, 2021  Access: LUA AVG  Scheduled Treatment Time: 3 Hours  UF Removed: 1.5 Liters  BFR: 400  Next Scheduled Treatment : 07/29/2021  Note:  Patient presents for Hemodialysis session without concern, LUA graft intact, patient request Pain Ease for comfort during cannulation.   Patient stable during treatment, BFR maintained, targeted UF reached. Epogen given as ordered. Post treatment bleeding minimal.   Patient transported to assigned room, report provided to primary nurse.

## 2021-07-26 NOTE — TOC Progression Note (Signed)
Transition of Care [facility]) - Progression Note    Patient Details  Name: [name] MRN: [id number] Date of Birth: March 07, 1965  Transition of Care [facility]) CM/SW [place], RN Phone Number: 07/26/2021, 11:17 AM  Clinical Narrative:    Awaiting a chair time in [place] to go to [facility]   Expected Discharge Plan: [place] Barriers to Discharge: Continued Medical Work up  Expected Discharge Plan and Services Expected Discharge Plan: [place]   Discharge Planning Services: CM Consult   Living arrangements for the past 2 months: Single Family Home                 DME Arranged: N/A                     Social Determinants of Health (SDOH) Interventions    Readmission Risk Interventions     View : No data to display.

## 2021-07-26 NOTE — Progress Notes (Signed)
PT Cancellation Note  Patient Details Name: [name] MRN: [id number] DOB: 30-Apr-1965   Cancelled Treatment:     Pt was supine in bed upon arriving. She agrees to session with encouragement however transport arrived to take pt to HD. Pt requested to use bedpan prior to going down. Assisted pt onto bed pan. Will return later when pt is available to participate.    Willette Pa 07/26/2021, 8:38 AM

## 2021-07-26 NOTE — Plan of Care (Signed)

## 2021-07-26 NOTE — TOC Progression Note (Signed)
Transition of Care [facility]) - Progression Note    Patient Details  Name: [name] MRN: [id number] Date of Birth: 08/07/1965  Transition of Care [facility] CM/SW [place], RN Phone Number: 07/26/2021, 2:44 PM  Clinical Narrative:     Patient does not have a chair time yet, Still trying to get chair time but with the holiday weekend she may not  get a chair time until Tuesday  Expected Discharge Plan: [place] Barriers to Discharge:  (chair time, dil[place]is)  Expected Discharge Plan and Services Expected Discharge Plan: [place]   Discharge Planning Services: CM Consult   Living arrangements for the past 2 months: Single Family Home                 DME Arranged: N/A                     Social Determinants of Health (SDOH) Interventions    Readmission Risk Interventions     View : No data to display.

## 2021-07-26 NOTE — Progress Notes (Signed)
PROGRESS NOTE    [name]  ZOX:[id number] DOB: 1966/01/29  DOA: 07/21/2021 Date of Service: 07/26/21 PCP: Julian Hy, PA-C     Brief Narrative / Hospital Course:  [name] is a 56 year old female with history of hypertension, hyperlipidemia, end-stage renal disease on HD MWF, history of left BKA in 2021, depression, anxiety, insulin-dependent diabetes mellitus, insomnia, chronic pain syndrome, who presents emergency department for chief concerns of pain from falling during transfer from wheelchair to recliner about 1 week prior to admission. She missed dialysis for whole week.  Upon arriving the hospital 07/21/21, she was found to have a distal left femur fracture, deemed no surgery by orthopedics.  But no weightbearing.  Patient is also restarted on dialysis. As of today 07/26/21 waiting on patient to decide on SNF facility for rehab since she was initially advised inpatient rehab but later stated she did not think she would be able to participate in intensive therapy. Pending dialysis spot now but SNF placement is confirmed.   Consultants:  Nephrology Orthopedics  Procedures: Dialysis per routine     Subjective: Patient has no complaints today, no CP/SOB, no N/V, pain controlled      ASSESSMENT & PLAN:   Principal Problem:   Femur fracture, left ([place]) Active Problems:   TOBACCO DEPENDENCE   Essential hypertension, benign   Anxiety and depression   Insomnia   Type 2 diabetes mellitus with renal complication (HCC)   Obesity (BMI 30.0-34.9)   Hyponatremia   ESRD on dialysis ([place])   Leukocytosis   Essential hypertension   History of CVA (cerebrovascular accident) without residual deficits   Hyperkalemia   Metabolic acidemia   Closed fracture of left distal femur ([place])   Uncontrolled type 2 diabetes mellitus with hypoglycemia, with long-term current use of insulin (HCC)   Essential hypertension, benign - Patient takes amlodipine 5 mg daily,  carvedilol 6.25 mg p.o. twice daily, these have been resumed for 07/22/2021 due to low normotensive in setting of requiring hemodialysis - Hydralazine 100 mg 3 times daily have not been resumed due to the same  Femur fracture, left [facility]) Orthopedic service has been consulted by EDP, who recommends no surgery while inpatient Orthopedic service recommends brace in flexion of the left knee, nonweightbearing Patient to follow-up outpatient with orthopedic provider in 1 week after discharge SNF in place but unable to go d/t unable to arrange dialysis scheulde  Hyperkalemia - Presumed secondary to missing hemodialysis session - Status post Veltassa, insulin 5 units, dextrose 1 amp, sodium bicarbonate injection - Nephrology has been consulted and recommended the above - veltassa d/c   Essential hypertension - Amlodipine 5 mg daily, carvedilol 6.25 mg p.o. twice daily -Hydralazine 5 mg IV every 6 hours.  For SBP greater than 180, 3 doses ordered  Anxiety and depression - Amitriptyline 25 mg nightly, Celexa 20 mg daily, duloxetine 60 mg daily  TOBACCO DEPENDENCE - Nicotine patch as needed for nicotine craving ordered  ESRD on dialysis [facility]) - Nephrology following  Leukocytosis WBC up and neutrophilia seems more likely reactive/inflammatory, no s/s infection, still trending down compared to admission  Would repeat in 1 week Further eval sooner if fever, cough/SOB, pain other concern for infection    DVT prophylaxis: heparin Code Status: FULL Family Communication: Patient declines call to family/support person at this time Disposition Plan / TOC needs: need HD spot  Barriers to discharge / significant pending items: SNF placement  Objective: Vitals:   07/26/21 1245 07/26/21 1248 07/26/21 1314 07/26/21 1612  BP: (!) 122/51 (!) 116/49  (!) 140/49  Pulse: 91 95 92 88  Resp: 14 15 17 18   Temp:  98.9 F (37.2 C)    TempSrc:  Oral    SpO2:    92%  Weight:  80.7  kg    Height:        Intake/Output Summary (Last 24 hours) at 07/26/2021 1851 Last data filed at 07/26/2021 1248 Gross per 24 hour  Intake --  Output 1500 ml  Net -1500 ml    Filed Weights   07/24/21 1235 07/26/21 0916 07/26/21 1248  Weight: 82 kg 82.1 kg 80.7 kg    Examination:  Constitutional:  VS as above General Appearance: alert, well-developed, well-nourished, NAD Eyes: Normal lids and conjunctive, non-icteric sclera Ears, Nose, Mouth, Throat: Normal appearance MMM Neck: No masses, trachea midline Respiratory: Normal respiratory effort Breath sounds normal, no wheeze/rhonchi/rales Cardiovascular: S1/S2 normal, RRR No lower extremity edema on RLE Gastrointestinal: Nontender, no masses No hepatomegaly, no splenomegaly No hernia appreciated Musculoskeletal:  No clubbing/cyanosis of digits Neurological: No cranial nerve deficit on limited exam Motor and sensation intact and symmetric Psychiatric: Normal judgment/insight Normal mood and affect       Scheduled Medications:   amitriptyline  25 mg Oral QHS   amLODipine  5 mg Oral Daily   atorvastatin  40 mg Oral q1800   carvedilol  6.25 mg Oral BID   Chlorhexidine Gluconate Cloth  6 each Topical Q0600   citalopram  20 mg Oral Daily   DULoxetine  60 mg Oral Daily   epoetin alfa       epoetin (EPOGEN/PROCRIT) injection  10,000 Units Intravenous Q M,W,F-HD   heparin  5,000 Units Subcutaneous Q8H   insulin aspart  0-6 Units Subcutaneous TID WC   insulin glargine-yfgn  6 Units Subcutaneous QHS   pantoprazole  40 mg Oral Daily   sevelamer carbonate  1,600 mg Oral TID WC    Continuous Infusions:   PRN Medications:  cyclobenzaprine, HYDROmorphone (DILAUDID) injection, nicotine, ondansetron **OR** ondansetron (ZOFRAN) IV, oxyCODONE-acetaminophen, polyethylene glycol  Antimicrobials:  Anti-infectives (From admission, onward)    None       Data Reviewed: I have personally reviewed following labs and  imaging studies  CBC: Recent Labs  Lab 07/21/21 1729 07/22/21 0256 07/23/21 0418 07/25/21 0509  WBC 17.2* 19.1* 13.4* 16.1*  16.8*  NEUTROABS  --   --   --  10.5*  HGB 9.0* 8.4* 7.8* 7.6*  7.6*  HCT 28.0* 25.4* 24.2* 24.0*  23.8*  MCV 89.7 87.6 88.3 92.0  92.2  PLT 277 256 249 287  833    Basic Metabolic Panel: Recent Labs  Lab 07/21/21 1729 07/22/21 0256 07/23/21 1127 07/25/21 0509  NA 127* 129* 132* 133*  K 6.1* 5.7* 4.4 3.8  CL 89* 87* 92* 94*  CO2 13* 15* 23 27  GLUCOSE 126* 98 210* 220*  BUN 160* 156* 89* 59*  CREATININE 14.43* 14.55* 9.20* 7.28*  CALCIUM 8.3* 8.1* 8.3* 8.7*    GFR: Estimated Creatinine Clearance: 9.6 mL/min (A) (by C-G formula based on SCr of 7.28 mg/dL (H)). Liver Function Tests: Recent Labs  Lab 07/21/21 1729  AST 12*  ALT 8  ALKPHOS 192*  BILITOT 1.3*  PROT 7.7  ALBUMIN 2.9*    No results for input(s): LIPASE, AMYLASE in the last 168 hours. No results for input(s): AMMONIA in the last 168 hours. Coagulation Profile:  No results for input(s): INR, PROTIME in the last 168 hours. Cardiac Enzymes: No results for input(s): CKTOTAL, CKMB, CKMBINDEX, TROPONINI in the last 168 hours. BNP (last 3 results) No results for input(s): PROBNP in the last 8760 hours. HbA1C: No results for input(s): HGBA1C in the last 72 hours.  CBG: Recent Labs  Lab 07/25/21 2238 07/26/21 0750 07/26/21 1336 07/26/21 1613 07/26/21 1700  GLUCAP 371* 291* 191* 347* 358*    Lipid Profile: No results for input(s): CHOL, HDL, LDLCALC, TRIG, CHOLHDL, LDLDIRECT in the last 72 hours. Thyroid Function Tests: No results for input(s): TSH, T4TOTAL, FREET4, T3FREE, THYROIDAB in the last 72 hours. Anemia Panel: No results for input(s): VITAMINB12, FOLATE, FERRITIN, TIBC, IRON, RETICCTPCT in the last 72 hours. Urine analysis:    Component Value Date/Time   COLORURINE YELLOW 01/19/2020 1402   APPEARANCEUR TURBID (A) 01/19/2020 1402   LABSPEC 1.010  01/19/2020 1402   PHURINE 6.0 01/19/2020 1402   GLUCOSEU 50 (A) 01/19/2020 1402   GLUCOSEU NEGATIVE 03/29/2014 1259   HGBUR NEGATIVE 01/19/2020 1402   BILIRUBINUR NEGATIVE 01/19/2020 1402   BILIRUBINUR neg 02/16/2017 1501   KETONESUR NEGATIVE 01/19/2020 1402   PROTEINUR 100 (A) 01/19/2020 1402   UROBILINOGEN 0.2 02/16/2017 1501   UROBILINOGEN 0.2 03/29/2014 1259   NITRITE NEGATIVE 01/19/2020 1402   LEUKOCYTESUR TRACE (A) 01/19/2020 1402   Sepsis Labs: @LABRCNTIP (procalcitonin:4,lacticidven:4)  Recent Results (from the past 240 hour(s))  Culture, blood (Routine X 2) w Reflex to ID Panel     Status: None (Preliminary result)   Collection Time: 07/22/21  2:44 PM   Specimen: Right Antecubital; Blood  Result Value Ref Range Status   Specimen Description RIGHT ANTECUBITAL  Final   Special Requests   Final    BOTTLES DRAWN AEROBIC AND ANAEROBIC Blood Culture adequate volume   Culture   Final    NO GROWTH 4 DAYS Performed at [facility], [address] [place], [place] [address]    Report Status PENDING  Incomplete  Culture, blood (Routine X 2) w Reflex to ID Panel     Status: None (Preliminary result)   Collection Time: 07/22/21  2:46 PM   Specimen: BLOOD RIGHT HAND  Result Value Ref Range Status   Specimen Description BLOOD RIGHT HAND  Final   Special Requests   Final    BOTTLES DRAWN AEROBIC AND ANAEROBIC Blood Culture adequate volume   Culture   Final    NO GROWTH 4 DAYS Performed at [facility], [address], [place], [place] [address]    Report Status PENDING  Incomplete         Radiology Studies last 96 hours: No results found.          LOS: 4 days     Emeterio Reeve, DO [facility] 07/26/2021, 6:51 PM   Staff may message me via secure chat in [place]  but this may not receive immediate response,  please page for urgent matters!  If 7PM-7AM, please contact night-coverage [email or web address]  Dictation software was used  to generate the above note. Typos may occur and escape review, as with typed/written notes. Please contact Dr Sheppard Coil directly for clarity if needed.

## 2021-07-26 NOTE — Progress Notes (Signed)
OT Cancellation Note  Patient Details Name: [name] MRN: [id number] DOB: 28-Sep-1965   Cancelled Treatment:    Reason Eval/Treat Not Completed: Patient at procedure or test/ unavailable. Pt is currently off unit for HD. OT to re-attempt when pt is next available.   Darleen Crocker, MS, OTR/L , CBIS ascom [phone]  07/26/21, 1:24 PM

## 2021-07-26 NOTE — Progress Notes (Signed)
Physical Therapy Treatment Patient Details Name: [name] MRN: [id number] DOB: 02/24/1966 Today's Date: 07/26/2021   History of Present Illness Pt is a 56 y/o F admitted on 07/21/21 after presenting to ED for c/c of pain from falling during transfer from w/c to recliner ~1 week ago & pt missed dialysis for a whole week. Pt found to ahve distal L femur fx & deemed no surgery by orthopedics but NWB LLE. PMH: HTN, HLD, ESRD on HD MWF, L BKA in 2021, depression, anxiety, insulin-dependent DM, insomnia, chronic pain syndromes    PT Comments    Pt was supine in bed upon arriving. She was premedicated and agreeable to session however once mobility started, pt yells out in pain and states" I just cant do it today, it hurts too bad. Author encouraged another attempt but pt remained unwilling. She did perform a few exercises prior to requesting for author to return tomorrow. Pt is severely limited by pain and has been self limiting throughout this admission. Author recommends DC to SNF to address deficits while maximizing independence with ADLs.     Recommendations for follow up therapy are one component of a multi-disciplinary discharge planning process, led by the attending physician.  Recommendations may be updated based on patient status, additional functional criteria and insurance authorization.  Follow Up Recommendations  Skilled nursing-short term rehab (<3 hours/day)     Assistance Recommended at Discharge Frequent or constant Supervision/Assistance  Patient can return home with the following A lot of help with walking and/or transfers;A lot of help with bathing/dressing/bathroom;Assistance with cooking/housework;Direct supervision/assist for medications management;Direct supervision/assist for financial management;Assist for transportation;Help with stairs or ramp for entrance   Equipment Recommendations  None recommended by PT       Precautions / Restrictions  Precautions Precautions: Fall Restrictions Weight Bearing Restrictions: Yes LLE Weight Bearing: Non weight bearing Other Position/Activity Restrictions: pt also with chronic L BKA     Mobility  Bed Mobility Overal bed mobility: Needs Assistance Bed Mobility: Rolling Rolling: Max assist    General bed mobility comments: pt was agreeable to perform OOB activity at first however once she started to move towards EOB (roll), she begins yelling out in pain. " I cant do it, it hurts too much." encourage another attempt however pt unwilling. Did perform a few ther ex in bed prior to pt requested author return next date.    Transfers  General transfer comment: pt unable to tolerate even sitting up EOB      Balance Overall balance assessment: Needs assistance Sitting-balance support: Bilateral upper extremity supported, Feet supported Sitting balance-Leahy Scale: Good     Standing balance support: Bilateral upper extremity supported, During functional activity, Reliant on assistive device for balance Standing balance-Leahy Scale: Fair     Cognition Arousal/Alertness: Awake/alert Behavior During Therapy: WFL for tasks assessed/performed Overall Cognitive Status: No family/caregiver present to determine baseline cognitive functioning    General Comments: Pt is A and O x4               Pertinent Vitals/Pain Pain Assessment Pain Assessment: 0-10 Pain Score: 9  Faces Pain Scale: Hurts even more Pain Location: L LE Pain Descriptors / Indicators: Discomfort, Spasm Pain Intervention(s): Limited activity within patient's tolerance, Monitored during session, Premedicated before session, Repositioned     PT Goals (current goals can now be found in the care plan section) Acute Rehab PT Goals Patient Stated Goal: decreased pain Progress towards PT goals: Progressing toward goals    Frequency  7X/week      PT Plan Current plan remains appropriate       AM-PAC PT "6  Clicks" Mobility   Outcome Measure  Help needed turning from your back to your side while in a flat bed without using bedrails?: A Lot Help needed moving from lying on your back to sitting on the side of a flat bed without using bedrails?: A Lot Help needed moving to and from a bed to a chair (including a wheelchair)?: A Lot Help needed standing up from a chair using your arms (e.g., wheelchair or bedside chair)?: Total Help needed to walk in hospital room?: Total Help needed climbing 3-5 steps with a railing? : Total 6 Click Score: 9    End of Session   Activity Tolerance: Patient limited by pain Patient left: in bed;with bed alarm set;with call bell/phone within reach;with nursing/sitter in room Nurse Communication: Mobility status PT Visit Diagnosis: Difficulty in walking, not elsewhere classified (R26.2);Other abnormalities of gait and mobility (R26.89);Muscle weakness (generalized) (M62.81);Other (comment);Pain Pain - Right/Left: Left Pain - part of body: Leg     Time: 7096-4383 PT Time Calculation (min) (ACUTE ONLY): 9 min  Charges:  $Therapeutic Activity: 8-22 mins                    Julaine Fusi PTA 07/26/21, 4:37 PM

## 2021-07-26 NOTE — Progress Notes (Signed)
Risk assessment completed

## 2021-07-26 NOTE — Progress Notes (Signed)
Awaiting clinical acceptance and financial clearance.

## 2021-07-26 NOTE — Progress Notes (Signed)
[facility]  ROUNDING NOTE   Subjective:   Patient seen and evaluated during dialysis   HEMODIALYSIS FLOWSHEET:  Blood Flow Rate (mL/min): 400 mL/min Arterial Pressure (mmHg): -230 mmHg Venous Pressure (mmHg): 220 mmHg Transmembrane Pressure (mmHg): 50 mmHg Ultrafiltration Rate (mL/min): 660 mL/min Dialysate Flow Rate (mL/min): 500 ml/min Conductivity: Machine : 13.8 Conductivity: Machine : 13.8 Dialysis Fluid Bolus: Normal Saline Bolus Amount (mL): 50 mL (50 ml given due to high pressures possible clotting) Dialysate Change: Other (comment) (bath changed to 3K)  No current complaints.  Tolerating treatment well.   Objective:  Vital signs in last 24 hours:  Temp:  [98.1 F (36.7 C)-99.4 F (37.4 C)] 98.9 F (37.2 C) (05/26 1248) Pulse Rate:  [73-95] 92 (05/26 1314) Resp:  [13-20] 17 (05/26 1314) BP: (116-150)/(49-60) 116/49 (05/26 1248) SpO2:  [93 %-98 %] 94 % (05/26 0941) Weight:  [80.7 kg-82.1 kg] 80.7 kg (05/26 1248)  Weight change:  Filed Weights   07/24/21 1235 07/26/21 0916 07/26/21 1248  Weight: 82 kg 82.1 kg 80.7 kg    Intake/Output: I/O last 3 completed shifts: In: 835 [P.O.:835] Out: -    Intake/Output this shift:  Total I/O In: -  Out: 1500 [Other:1500]  Physical Exam: General: NAD  Head: Normocephalic, atraumatic. Moist oral mucosal membranes  Eyes: Anicteric  Lungs:  Clear to auscultation,normal effort  Heart: Regular rate and rhythm  Abdomen:  Soft, nontender  Extremities:  No peripheral edema. Lt BKA  Neurologic: Nonfocal, moving all four extremities  Skin: No lesions  Access: Lt AVF    Basic Metabolic Panel: Recent Labs  Lab 07/21/21 1729 07/22/21 0256 07/23/21 1127 07/25/21 0509  NA 127* 129* 132* 133*  K 6.1* 5.7* 4.4 3.8  CL 89* 87* 92* 94*  CO2 13* 15* 23 27  GLUCOSE 126* 98 210* 220*  BUN 160* 156* 89* 59*  CREATININE 14.43* 14.55* 9.20* 7.28*  CALCIUM 8.3* 8.1* 8.3* 8.7*     Liver Function  Tests: Recent Labs  Lab 07/21/21 1729  AST 12*  ALT 8  ALKPHOS 192*  BILITOT 1.3*  PROT 7.7  ALBUMIN 2.9*    No results for input(s): LIPASE, AMYLASE in the last 168 hours. No results for input(s): AMMONIA in the last 168 hours.  CBC: Recent Labs  Lab 07/21/21 1729 07/22/21 0256 07/23/21 0418 07/25/21 0509  WBC 17.2* 19.1* 13.4* 16.1*  16.8*  NEUTROABS  --   --   --  10.5*  HGB 9.0* 8.4* 7.8* 7.6*  7.6*  HCT 28.0* 25.4* 24.2* 24.0*  23.8*  MCV 89.7 87.6 88.3 92.0  92.2  PLT 277 256 249 287  286     Cardiac Enzymes: No results for input(s): CKTOTAL, CKMB, CKMBINDEX, TROPONINI in the last 168 hours.  BNP: Invalid input(s): POCBNP  CBG: Recent Labs  Lab 07/25/21 1704 07/25/21 2020 07/25/21 2238 07/26/21 0750 07/26/21 1336  GLUCAP 345* 350* 371* 291* 191*     Microbiology: Results for orders placed or performed during the hospital encounter of 07/21/21  Culture, blood (Routine X 2) w Reflex to ID Panel     Status: None (Preliminary result)   Collection Time: 07/22/21  2:44 PM   Specimen: Right Antecubital; Blood  Result Value Ref Range Status   Specimen Description RIGHT ANTECUBITAL  Final   Special Requests   Final    BOTTLES DRAWN AEROBIC AND ANAEROBIC Blood Culture adequate volume   Culture   Final    NO GROWTH 4 DAYS Performed at  [facility], [address] [place], [place] [address]    Report Status PENDING  Incomplete  Culture, blood (Routine X 2) w Reflex to ID Panel     Status: None (Preliminary result)   Collection Time: 07/22/21  2:46 PM   Specimen: BLOOD RIGHT HAND  Result Value Ref Range Status   Specimen Description BLOOD RIGHT HAND  Final   Special Requests   Final    BOTTLES DRAWN AEROBIC AND ANAEROBIC Blood Culture adequate volume   Culture   Final    NO GROWTH 4 DAYS Performed at [facility], [address] [place], [place] [address]    Report Status PENDING  Incomplete    Coagulation Studies: No  results for input(s): LABPROT, INR in the last 72 hours.  Urinalysis: No results for input(s): COLORURINE, LABSPEC, PHURINE, GLUCOSEU, HGBUR, BILIRUBINUR, KETONESUR, PROTEINUR, UROBILINOGEN, NITRITE, LEUKOCYTESUR in the last 72 hours.  Invalid input(s): APPERANCEUR    Imaging: No results found.   Medications:     amitriptyline  25 mg Oral QHS   amLODipine  5 mg Oral Daily   atorvastatin  40 mg Oral q1800   carvedilol  6.25 mg Oral BID   Chlorhexidine Gluconate Cloth  6 each Topical Q0600   citalopram  20 mg Oral Daily   DULoxetine  60 mg Oral Daily   epoetin alfa       epoetin (EPOGEN/PROCRIT) injection  10,000 Units Intravenous Q M,W,F-HD   heparin  5,000 Units Subcutaneous Q8H   insulin aspart  0-6 Units Subcutaneous TID WC   insulin glargine-yfgn  6 Units Subcutaneous QHS   pantoprazole  40 mg Oral Daily   sevelamer carbonate  1,600 mg Oral TID WC   cyclobenzaprine, HYDROmorphone (DILAUDID) injection, nicotine, ondansetron **OR** ondansetron (ZOFRAN) IV, oxyCODONE-acetaminophen, polyethylene glycol  Assessment/ Plan:  [name] is a 56 y.o.  female with past medical history including hyperlipidemia, hypertension, left BKA, diabetes, chronic pain syndrome, and end-stage renal disease on hemodialysis, who was admitted to [facility] on 07/21/2021 for Acidosis [E87.20] Hyperkalemia [E87.5] Elevated BUN [R79.9] ESRD (end stage renal disease) on dialysis ([place]) [N18.6, Z99.2] Femur fracture, left (HCC) [S72.92XA] Closed fracture of left femur, unspecified fracture morphology, unspecified portion of femur, initial encounter ([place]) [S72.92XA] Closed fracture of left distal femur ([place]) [S72.402A]  CK [facility]/MWF/left aVF/83 kg  End-stage renal disease on dialysis.  Will maintain outpatient schedule if possible.    Received scheduled dialysis treatment today with UF goal1.5 L as tolerated.  Treatment tolerated well today without increased pressures. Next treatment  scheduled for Monday.  Dialysis coordinator currently seeking outpatient dialysis clinic due to patient's rehab needs.  Search remains in progress  2. Anemia of chronic kidney disease Normocytic Lab Results  Component Value Date   HGB 7.6 (L) 07/25/2021   HGB 7.6 (L) 07/25/2021  Hemoglobin remains below desired target.  Continue EPO 10000 unit IV with dialysis.   3. Secondary Hyperparathyroidism:  Lab Results  Component Value Date   PTH 44 01/11/2020   PTH Comment 01/11/2020   CALCIUM 8.7 (L) 07/25/2021   CAION 1.10 (L) 04/07/2017   PHOS 5.1 (H) 02/20/2020   We will continue to monitor bone minerals during this admission.  Sevelamer ordered with meals  4.  Hypertension with chronic kidney disease.  Home regimen includes amlodipine, carvedilol and hydralazine.  Currently receiving these medications.  Blood pressure 123/53 during dialysis  5. Diabetes mellitus type II with chronic kidney disease: insulin dependent. Home regimen  includes to send NovoLog 70/30. Most recent hemoglobin A1c is 6.2 on 12/29/19.           Primary team to manage SSI   LOS: 4 [place] 5/26/20232:46 PM

## 2021-07-26 NOTE — Progress Notes (Signed)
Inpatient Diabetes Program Recommendations  AACE/ADA: New Consensus Statement on Inpatient Glycemic Control (2015)  Target Ranges:  Prepandial:   less than 140 mg/dL      Peak postprandial:   less than 180 mg/dL (1-2 hours)      Critically ill patients:  140 - 180 mg/dL   Lab Results  Component Value Date   GLUCAP 291 (H) 07/26/2021   HGBA1C 7.1 (H) 07/22/2021    Review of Glycemic Control  Latest Reference Range & Units 07/24/21 08:51 07/24/21 12:56 07/24/21 16:43 07/24/21 21:31 07/25/21 07:43  Glucose-Capillary 70 - 99 mg/dL 179 (H) 151 (H) 261 (H) 258 (H) 236 (H)    Latest Reference Range & Units 07/25/21 07:43 07/25/21 11:33 07/25/21 17:04 07/25/21 20:20 07/25/21 22:38 07/26/21 07:50  Glucose-Capillary 70 - 99 mg/dL 236 (H) 269 (H) 345 (H) 350 (H) 371 (H) 291 (H)   Diabetes history: DM 2 Outpatient Diabetes medications: Lantus 12 QHS + Novolog 70/30 Insulin 10 units TID Current orders for Inpatient glycemic control:  Semglee 6 units qhs Novolog 0-6 units tid  Inpatient Diabetes Program Recommendations:    -  consider increasing Semglee to 8 units -  Add Novolog 2 units tid meal coverage if eating >50% of meals   Thanks,  Tama Headings RN, MSN, BC-ADM Inpatient Diabetes Coordinator Team Pager [phone] (8a-5p)

## 2021-07-27 ENCOUNTER — Inpatient Hospital Stay: Payer: Medicare Other

## 2021-07-27 DIAGNOSIS — S72492A Other fracture of lower end of left femur, initial encounter for closed fracture: Secondary | ICD-10-CM | POA: Diagnosis not present

## 2021-07-27 DIAGNOSIS — N186 End stage renal disease: Secondary | ICD-10-CM | POA: Diagnosis not present

## 2021-07-27 DIAGNOSIS — S72352A Displaced comminuted fracture of shaft of left femur, initial encounter for closed fracture: Secondary | ICD-10-CM | POA: Diagnosis not present

## 2021-07-27 DIAGNOSIS — J189 Pneumonia, unspecified organism: Secondary | ICD-10-CM | POA: Diagnosis present

## 2021-07-27 DIAGNOSIS — F419 Anxiety disorder, unspecified: Secondary | ICD-10-CM | POA: Diagnosis not present

## 2021-07-27 LAB — GLUCOSE, CAPILLARY
Glucose-Capillary: 292 mg/dL — ABNORMAL HIGH (ref 70–99)
Glucose-Capillary: 361 mg/dL — ABNORMAL HIGH (ref 70–99)
Glucose-Capillary: 303 mg/dL — ABNORMAL HIGH (ref 70–99)
Glucose-Capillary: 286 mg/dL — ABNORMAL HIGH (ref 70–99)

## 2021-07-27 LAB — CBC WITH DIFFERENTIAL/PLATELET
Abs Immature Granulocytes: 1.08 10*3/uL — ABNORMAL HIGH (ref 0.00–0.07)
Basophils Absolute: 0.1 10*3/uL (ref 0.0–0.1)
Basophils Relative: 1 %
Eosinophils Absolute: 0.5 10*3/uL (ref 0.0–0.5)
Eosinophils Relative: 2 %
HCT: 23.9 % — ABNORMAL LOW (ref 36.0–46.0)
Hemoglobin: 7.4 g/dL — ABNORMAL LOW (ref 12.0–15.0)
Immature Granulocytes: 4 %
Lymphocytes Relative: 14 %
Lymphs Abs: 3.4 10*3/uL (ref 0.7–4.0)
MCH: 29.2 pg (ref 26.0–34.0)
MCHC: 31 g/dL (ref 30.0–36.0)
MCV: 94.5 fL (ref 80.0–100.0)
Monocytes Absolute: 2.6 10*3/uL — ABNORMAL HIGH (ref 0.1–1.0)
Monocytes Relative: 10 %
Neutro Abs: 17.2 10*3/uL — ABNORMAL HIGH (ref 1.7–7.7)
Neutrophils Relative %: 69 %
Platelets: 351 10*3/uL (ref 150–400)
RBC: 2.53 MIL/uL — ABNORMAL LOW (ref 3.87–5.11)
RDW: 16 % — ABNORMAL HIGH (ref 11.5–15.5)
WBC: 25 10*3/uL — ABNORMAL HIGH (ref 4.0–10.5)
nRBC: 0.2 % (ref 0.0–0.2)

## 2021-07-27 LAB — BASIC METABOLIC PANEL
Anion gap: 10 (ref 5–15)
BUN: 60 mg/dL — ABNORMAL HIGH (ref 6–20)
CO2: 28 mmol/L (ref 22–32)
Calcium: 8.8 mg/dL — ABNORMAL LOW (ref 8.9–10.3)
Chloride: 94 mmol/L — ABNORMAL LOW (ref 98–111)
Creatinine, Ser: 6.79 mg/dL — ABNORMAL HIGH (ref 0.44–1.00)
GFR, Estimated: 7 mL/min — ABNORMAL LOW (ref >60)
Glucose, Bld: 321 mg/dL — ABNORMAL HIGH (ref 70–99)
Potassium: 4 mmol/L (ref 3.5–5.1)
Sodium: 132 mmol/L — ABNORMAL LOW (ref 135–145)

## 2021-07-27 LAB — CBC
HCT: 23.7 % — ABNORMAL LOW (ref 36.0–46.0)
Hemoglobin: 7.2 g/dL — ABNORMAL LOW (ref 12.0–15.0)
MCH: 28.5 pg (ref 26.0–34.0)
MCHC: 30.4 g/dL (ref 30.0–36.0)
MCV: 93.7 fL (ref 80.0–100.0)
Platelets: 333 10*3/uL (ref 150–400)
RBC: 2.53 MIL/uL — ABNORMAL LOW (ref 3.87–5.11)
RDW: 16 % — ABNORMAL HIGH (ref 11.5–15.5)
WBC: 24.1 10*3/uL — ABNORMAL HIGH (ref 4.0–10.5)
nRBC: 0.2 % (ref 0.0–0.2)

## 2021-07-27 LAB — CULTURE, BLOOD (ROUTINE X 2)
Culture: NO GROWTH
Culture: NO GROWTH
Special Requests: ADEQUATE
Special Requests: ADEQUATE

## 2021-07-27 LAB — MRSA NEXT GEN BY PCR, NASAL: MRSA by PCR Next Gen: NOT DETECTED

## 2021-07-27 MED ORDER — INSULIN ASPART 100 UNIT/ML IJ SOLN
0.0000 [IU] | Freq: Three times a day (TID) | INTRAMUSCULAR | Status: DC
  Administered 2021-07-27: 7 [IU] via SUBCUTANEOUS
  Administered 2021-07-28: 9 [IU] via SUBCUTANEOUS
  Filled 2021-07-27: qty 1

## 2021-07-27 MED ORDER — INSULIN ASPART 100 UNIT/ML IJ SOLN
5.0000 [IU] | Freq: Three times a day (TID) | INTRAMUSCULAR | Status: DC
  Administered 2021-07-27 – 2021-07-28 (×2): 5 [IU] via SUBCUTANEOUS
  Filled 2021-07-27: qty 1

## 2021-07-27 MED ORDER — SODIUM CHLORIDE 0.9 % IV SOLN
2.0000 g | Freq: Three times a day (TID) | INTRAVENOUS | Status: DC
  Administered 2021-07-27 – 2021-07-29 (×6): 2 g via INTRAVENOUS
  Filled 2021-07-27: qty 2
  Filled 2021-07-27 (×2): qty 12.5
  Filled 2021-07-27: qty 2
  Filled 2021-07-27 (×3): qty 12.5

## 2021-07-27 MED ORDER — INSULIN ASPART 100 UNIT/ML IJ SOLN
0.0000 [IU] | Freq: Every day | INTRAMUSCULAR | Status: DC
  Administered 2021-07-27: 3 [IU] via SUBCUTANEOUS
  Filled 2021-07-27: qty 1

## 2021-07-27 MED ORDER — INSULIN GLARGINE-YFGN 100 UNIT/ML ~~LOC~~ SOLN
8.0000 [IU] | Freq: Every day | SUBCUTANEOUS | Status: DC
  Administered 2021-07-27: 8 [IU] via SUBCUTANEOUS
  Filled 2021-07-27 (×2): qty 0.08

## 2021-07-27 MED ORDER — SODIUM CHLORIDE 0.9 % IV SOLN
500.0000 mg | INTRAVENOUS | Status: DC
  Administered 2021-07-27 – 2021-07-30 (×4): 500 mg via INTRAVENOUS
  Filled 2021-07-27 (×4): qty 500

## 2021-07-27 NOTE — Assessment & Plan Note (Addendum)
WBC trending up, w/u reveals pneumonia on CXR despite no significant symptoms though pt does have smoker's cough, no SOB. Poorly controlled diabetes and pt's lack of effort w/ mobilization/inceptive spirometry likely contributing.   Starting cefepime, azithromycin (today 07/27/21 is day 1 of abx for HCAP)  Makes some urine will screen strep and legionella Ag  will screen MRSA and add vancomycin if needed   CBC in AM

## 2021-07-27 NOTE — Progress Notes (Signed)
PROGRESS NOTE    [name]  HYQ:[id number] DOB: Nov 12, 1965  DOA: 07/21/2021 Date of Service: 07/27/21 PCP: Julian Hy, PA-C     Brief Narrative / Hospital Course:  [name] is a 55 year old female with history of hypertension, hyperlipidemia, end-stage renal disease on HD MWF, history of left BKA in 2021, depression, anxiety, insulin-dependent diabetes mellitus, insomnia, chronic pain syndrome, who presents emergency department for chief concerns of pain from falling during transfer from wheelchair to recliner about 1 week prior to admission. She missed dialysis for whole week.  Upon arriving the hospital 07/21/21, she was found to have a distal left femur fracture, deemed no surgery by orthopedics.  But no weightbearing.  Patient is also restarted on dialysis. As of today 07/27/21 waiting on patient to decide on SNF facility for rehab since she was initially advised inpatient rehab but later stated she did not think she would be able to participate in intensive therapy. Pending dialysis spot now but SNF placement is confirmed. WBC had been trending up, workup showed multifocal pneumonia on CXR despite no significant symptoms, started abx 07/27/21, pending UA/Ucx for completeness (still makes some urine), sputum Cx, blood Cx.   Consultants:  Nephrology Orthopedics  Procedures: Dialysis per routine     Subjective: Patient has no complaints today, no CP/SOB, no N/V, pain controlled      ASSESSMENT & PLAN:   Principal Problem:   Femur fracture, left ([place]) Active Problems:   TOBACCO DEPENDENCE   Anxiety and depression   Insomnia   Type 2 diabetes mellitus with renal complication (HCC)   Obesity (BMI 30.0-34.9)   Hyponatremia   ESRD on dialysis ([place])   Leukocytosis   Essential hypertension   History of CVA (cerebrovascular accident) without residual deficits   Hyperkalemia   Metabolic acidemia   Closed fracture of left distal femur ([place])    Uncontrolled type 2 diabetes mellitus with hypoglycemia, with long-term current use of insulin ([place])   HCAP (healthcare-associated pneumonia)   Essential hypertension, benign - Patient takes amlodipine 5 mg daily, carvedilol 6.25 mg p.o. twice daily, these have been resumed for 07/22/2021 due to low normotensive in setting of requiring hemodialysis - Hydralazine 100 mg 3 times daily have not been resumed due to the same  Femur fracture, left [facility]) Orthopedic service recommends no surgery  Orthopedic service recommends brace in flexion of the left knee, nonweightbearing Patient to follow-up outpatient with orthopedic provider in 1 week after discharge rehab/SNF placement   Hyperkalemia Resolved Monitor bmp  Essential hypertension - Amlodipine 5 mg daily, carvedilol 6.25 mg p.o. twice daily -Hydralazine 5 mg IV every 6 hours.  For SBP greater than 180, 3 doses ordered  Anxiety and depression Amitriptyline 25 mg nightly, Celexa 20 mg daily, duloxetine 60 mg daily  TOBACCO DEPENDENCE - Nicotine patch as needed for nicotine craving ordered  ESRD on dialysis [facility]) Nephrology following  Leukocytosis WBC up and neutrophilia, CXR (+) multifocal pneumonia See A/P HCAP For completeness will also obtain UA/UCx (makes some urine)   HCAP (healthcare-associated pneumonia) WBC trending up, w/u reveals pneumonia on CXR despite no significant symptoms though pt does have smoker's cough, no SOB. Poorly controlled diabetes and pt's lack of effort w/ mobilization/inceptive spirometry likely contributing.  Starting cefepime, azithromycin (today 07/27/21 is day 1 of abx for HCAP) Makes some urine will screen strep and legionella Ag will screen MRSA and add vancomycin if needed  CBC in AM   Type 2 diabetes mellitus with renal complication ([place])  Increased SSI and basal insulin today    DVT prophylaxis: heparin Code Status: FULL Family Communication: daughter at bedside Disposition Plan / TOC  needs: need HD spot  Barriers to discharge / significant pending items: SNF placement arranged, needs HD spot, now on IV abx for HCAP             Objective: Vitals:   07/26/21 1612 07/26/21 1950 07/27/21 0450 07/27/21 0808  BP: (!) 140/49 (!) 151/52 (!) 139/56 (!) 131/57  Pulse: 88 82 74 74  Resp: 18 20 18    Temp:  99.4 F (37.4 C) 98.9 F (37.2 C) 98.4 F (36.9 C)  TempSrc:      SpO2: 92% 94% 97% 96%  Weight:      Height:        Intake/Output Summary (Last 24 hours) at 07/27/2021 1224 Last data filed at 07/26/2021 1853 Gross per 24 hour  Intake 240 ml  Output 1500 ml  Net -1260 ml   Filed Weights   07/24/21 1235 07/26/21 0916 07/26/21 1248  Weight: 82 kg 82.1 kg 80.7 kg    Examination:  Constitutional:  VS as above General Appearance: alert, well-developed, well-nourished, NAD Eyes: Normal lids and conjunctive, non-icteric sclera Ears, Nose, Mouth, Throat: Normal appearance MMM Neck: No masses, trachea midline Respiratory: Normal respiratory effort Breath sounds normal, no wheeze/rhonchi/rales Cardiovascular: S1/S2 normal, RRR No lower extremity edema on RLE Gastrointestinal: Nontender, no masses No hepatomegaly, no splenomegaly No hernia appreciated Musculoskeletal:  No clubbing/cyanosis of digits Neurological: No cranial nerve deficit on limited exam Motor and sensation intact and symmetric Psychiatric: Normal judgment/insight Normal mood and affect       Scheduled Medications:   amitriptyline  25 mg Oral QHS   amLODipine  5 mg Oral Daily   atorvastatin  40 mg Oral q1800   carvedilol  6.25 mg Oral BID   Chlorhexidine Gluconate Cloth  6 each Topical Q0600   citalopram  20 mg Oral Daily   DULoxetine  60 mg Oral Daily   epoetin (EPOGEN/PROCRIT) injection  10,000 Units Intravenous Q M,W,F-HD   heparin  5,000 Units Subcutaneous Q8H   insulin aspart  0-5 Units Subcutaneous QHS   insulin aspart  0-9 Units Subcutaneous TID WC   insulin  aspart  5 Units Subcutaneous TID WC   insulin glargine-yfgn  8 Units Subcutaneous QHS   pantoprazole  40 mg Oral Daily   sevelamer carbonate  1,600 mg Oral TID WC    Continuous Infusions:  azithromycin     ceFEPime (MAXIPIME) IV      PRN Medications:  cyclobenzaprine, HYDROmorphone (DILAUDID) injection, nicotine, ondansetron **OR** ondansetron (ZOFRAN) IV, oxyCODONE-acetaminophen, polyethylene glycol  Antimicrobials:  Anti-infectives (From admission, onward)    Start     Dose/Rate Route Frequency Ordered Stop   07/27/21 1400  ceFEPIme (MAXIPIME) 2 g in sodium chloride 0.9 % 100 mL IVPB        2 g 200 mL/hr over 30 Minutes Intravenous Every 8 hours 07/27/21 1219 08/01/21 1359   07/27/21 1315  azithromycin (ZITHROMAX) 500 mg in sodium chloride 0.9 % 250 mL IVPB        500 mg 250 mL/hr over 60 Minutes Intravenous Every 24 hours 07/27/21 1219 08/01/21 1314       Data Reviewed: I have personally reviewed following labs and imaging studies  CBC: Recent Labs  Lab 07/21/21 1729 07/22/21 0256 07/23/21 0418 07/25/21 0509 07/27/21 0433  WBC 17.2* 19.1* 13.4* 16.1*  16.8* 25.0*  24.1*  NEUTROABS  --   --   --  10.5* 17.2*  HGB 9.0* 8.4* 7.8* 7.6*  7.6* 7.4*  7.2*  HCT 28.0* 25.4* 24.2* 24.0*  23.8* 23.9*  23.7*  MCV 89.7 87.6 88.3 92.0  92.2 94.5  93.7  PLT 277 256 249 287  286 351  161   Basic Metabolic Panel: Recent Labs  Lab 07/21/21 1729 07/22/21 0256 07/23/21 1127 07/25/21 0509 07/27/21 0433  NA 127* 129* 132* 133* 132*  K 6.1* 5.7* 4.4 3.8 4.0  CL 89* 87* 92* 94* 94*  CO2 13* 15* 23 27 28   GLUCOSE 126* 98 210* 220* 321*  BUN 160* 156* 89* 59* 60*  CREATININE 14.43* 14.55* 9.20* 7.28* 6.79*  CALCIUM 8.3* 8.1* 8.3* 8.7* 8.8*   GFR: Estimated Creatinine Clearance: 10.2 mL/min (A) (by C-G formula based on SCr of 6.79 mg/dL (H)). Liver Function Tests: Recent Labs  Lab 07/21/21 1729  AST 12*  ALT 8  ALKPHOS 192*  BILITOT 1.3*  PROT 7.7  ALBUMIN  2.9*   No results for input(s): LIPASE, AMYLASE in the last 168 hours. No results for input(s): AMMONIA in the last 168 hours. Coagulation Profile: No results for input(s): INR, PROTIME in the last 168 hours. Cardiac Enzymes: No results for input(s): CKTOTAL, CKMB, CKMBINDEX, TROPONINI in the last 168 hours. BNP (last 3 results) No results for input(s): PROBNP in the last 8760 hours. HbA1C: No results for input(s): HGBA1C in the last 72 hours.  CBG: Recent Labs  Lab 07/26/21 1613 07/26/21 1700 07/26/21 2126 07/27/21 0843 07/27/21 1203  GLUCAP 347* 358* 447* 292* 361*   Lipid Profile: No results for input(s): CHOL, HDL, LDLCALC, TRIG, CHOLHDL, LDLDIRECT in the last 72 hours. Thyroid Function Tests: No results for input(s): TSH, T4TOTAL, FREET4, T3FREE, THYROIDAB in the last 72 hours. Anemia Panel: No results for input(s): VITAMINB12, FOLATE, FERRITIN, TIBC, IRON, RETICCTPCT in the last 72 hours. Urine analysis:    Component Value Date/Time   COLORURINE YELLOW 01/19/2020 1402   APPEARANCEUR TURBID (A) 01/19/2020 1402   LABSPEC 1.010 01/19/2020 1402   PHURINE 6.0 01/19/2020 1402   GLUCOSEU 50 (A) 01/19/2020 1402   GLUCOSEU NEGATIVE 03/29/2014 1259   HGBUR NEGATIVE 01/19/2020 1402   BILIRUBINUR NEGATIVE 01/19/2020 1402   BILIRUBINUR neg 02/16/2017 1501   KETONESUR NEGATIVE 01/19/2020 1402   PROTEINUR 100 (A) 01/19/2020 1402   UROBILINOGEN 0.2 02/16/2017 1501   UROBILINOGEN 0.2 03/29/2014 1259   NITRITE NEGATIVE 01/19/2020 1402   LEUKOCYTESUR TRACE (A) 01/19/2020 1402   Sepsis Labs: @LABRCNTIP (procalcitonin:4,lacticidven:4)  Recent Results (from the past 240 hour(s))  Culture, blood (Routine X 2) w Reflex to ID Panel     Status: None   Collection Time: 07/22/21  2:44 PM   Specimen: Right Antecubital; Blood  Result Value Ref Range Status   Specimen Description RIGHT ANTECUBITAL  Final   Special Requests   Final    BOTTLES DRAWN AEROBIC AND ANAEROBIC Blood Culture  adequate volume   Culture   Final    NO GROWTH 5 DAYS Performed at [facility], [address] [place],  [address]    Report Status 07/27/2021 FINAL  Final  Culture, blood (Routine X 2) w Reflex to ID Panel     Status: None   Collection Time: 07/22/21  2:46 PM   Specimen: BLOOD RIGHT HAND  Result Value Ref Range Status   Specimen Description BLOOD RIGHT HAND  Final   Special Requests   Final    BOTTLES DRAWN AEROBIC AND ANAEROBIC Blood Culture adequate volume  Culture   Final    NO GROWTH 5 DAYS Performed at [facility], [address] [place], [place] [address]    Report Status 07/27/2021 FINAL  Final         Radiology Studies last 96 hours: DG Chest Port 1 View  Result Date: 07/27/2021 CLINICAL DATA:  Difficulty breathing.  No cough.  Leukocytosis. EXAM: PORTABLE CHEST 1 VIEW COMPARISON:  07/21/2021. FINDINGS: Cardiac silhouette is normal in size. No mediastinal or hilar masses. Subtle airspace opacity noted above both hila, greater on the left. Remainder of the lungs is clear. No pleural effusion or pneumothorax. Skeletal structures are grossly intact. IMPRESSION: 1. Subtle airspace opacities extending from the superior hila, left greater than right suspected to be multifocal pneumonia. Electronically Signed   By: Lajean Manes M.D.   On: 07/27/2021 10:34            LOS: 5 days     Emeterio Reeve, DO [facility] 07/27/2021, 12:24 PM   Staff may message me via secure chat in [place]  but this may not receive immediate response,  please page for urgent matters!  If 7PM-7AM, please contact night-coverage [email or web address]  Dictation software was used to generate the above note. Typos may occur and escape review, as with typed/written notes. Please contact Dr Sheppard Coil directly for clarity if needed.

## 2021-07-27 NOTE — Progress Notes (Signed)
[facility]  ROUNDING NOTE   Subjective:   Patient seen resting in bed, eating breakfast  Tolerating meals without nausea and vomiting Remains on room air Denies pain and discomfort Received dialysis yesterday, tolerated well   Objective:  Vital signs in last 24 hours:  Temp:  [98.4 F (36.9 C)-99.4 F (37.4 C)] 98.4 F (36.9 C) (05/27 0808) Pulse Rate:  [74-95] 74 (05/27 0808) Resp:  [14-20] 18 (05/27 0450) BP: (116-151)/(49-58) 131/57 (05/27 0808) SpO2:  [92 %-97 %] 96 % (05/27 0808) Weight:  [80.7 kg] 80.7 kg (05/26 1248)  Weight change:  Filed Weights   07/24/21 1235 07/26/21 0916 07/26/21 1248  Weight: 82 kg 82.1 kg 80.7 kg    Intake/Output: I/O last 3 completed shifts: In: 240 [P.O.:240] Out: 1500 [Other:1500]   Intake/Output this shift:  No intake/output data recorded.  Physical Exam: General: NAD  Head: Normocephalic, atraumatic. Moist oral mucosal membranes  Eyes: Anicteric  Lungs:  Clear to auscultation,normal effort  Heart: Regular rate and rhythm  Abdomen:  Soft, nontender  Extremities:  No peripheral edema. Lt BKA  Neurologic: Nonfocal, moving all four extremities  Skin: No lesions  Access: Lt AVF    Basic Metabolic Panel: Recent Labs  Lab 07/21/21 1729 07/22/21 0256 07/23/21 1127 07/25/21 0509 07/27/21 0433  NA 127* 129* 132* 133* 132*  K 6.1* 5.7* 4.4 3.8 4.0  CL 89* 87* 92* 94* 94*  CO2 13* 15* 23 27 28   GLUCOSE 126* 98 210* 220* 321*  BUN 160* 156* 89* 59* 60*  CREATININE 14.43* 14.55* 9.20* 7.28* 6.79*  CALCIUM 8.3* 8.1* 8.3* 8.7* 8.8*     Liver Function Tests: Recent Labs  Lab 07/21/21 1729  AST 12*  ALT 8  ALKPHOS 192*  BILITOT 1.3*  PROT 7.7  ALBUMIN 2.9*    No results for input(s): LIPASE, AMYLASE in the last 168 hours. No results for input(s): AMMONIA in the last 168 hours.  CBC: Recent Labs  Lab 07/21/21 1729 07/22/21 0256 07/23/21 0418 07/25/21 0509 07/27/21 0433  WBC 17.2* 19.1* 13.4*  16.1*  16.8* 25.0*  24.1*  NEUTROABS  --   --   --  10.5* 17.2*  HGB 9.0* 8.4* 7.8* 7.6*  7.6* 7.4*  7.2*  HCT 28.0* 25.4* 24.2* 24.0*  23.8* 23.9*  23.7*  MCV 89.7 87.6 88.3 92.0  92.2 94.5  93.7  PLT 277 256 249 287  286 351  333     Cardiac Enzymes: No results for input(s): CKTOTAL, CKMB, CKMBINDEX, TROPONINI in the last 168 hours.  BNP: Invalid input(s): POCBNP  CBG: Recent Labs  Lab 07/26/21 1336 07/26/21 1613 07/26/21 1700 07/26/21 2126 07/27/21 0843  GLUCAP 191* 347* 358* 447* 292*     Microbiology: Results for orders placed or performed during the hospital encounter of 07/21/21  Culture, blood (Routine X 2) w Reflex to ID Panel     Status: None   Collection Time: 07/22/21  2:44 PM   Specimen: Right Antecubital; Blood  Result Value Ref Range Status   Specimen Description RIGHT ANTECUBITAL  Final   Special Requests   Final    BOTTLES DRAWN AEROBIC AND ANAEROBIC Blood Culture adequate volume   Culture   Final    NO GROWTH 5 DAYS Performed at [facility], [address] [place], [place] [address]    Report Status 07/27/2021 FINAL  Final  Culture, blood (Routine X 2) w Reflex to ID Panel     Status: None  Collection Time: 07/22/21  2:46 PM   Specimen: BLOOD RIGHT HAND  Result Value Ref Range Status   Specimen Description BLOOD RIGHT HAND  Final   Special Requests   Final    BOTTLES DRAWN AEROBIC AND ANAEROBIC Blood Culture adequate volume   Culture   Final    NO GROWTH 5 DAYS Performed at [facility], [address], [place], [place] [address]    Report Status 07/27/2021 FINAL  Final    Coagulation Studies: No results for input(s): LABPROT, INR in the last 72 hours.  Urinalysis: No results for input(s): COLORURINE, LABSPEC, PHURINE, GLUCOSEU, HGBUR, BILIRUBINUR, KETONESUR, PROTEINUR, UROBILINOGEN, NITRITE, LEUKOCYTESUR in the last 72 hours.  Invalid input(s): APPERANCEUR    Imaging: DG Chest Port 1 View  Result  Date: 07/27/2021 CLINICAL DATA:  Difficulty breathing.  No cough.  Leukocytosis. EXAM: PORTABLE CHEST 1 VIEW COMPARISON:  07/21/2021. FINDINGS: Cardiac silhouette is normal in size. No mediastinal or hilar masses. Subtle airspace opacity noted above both hila, greater on the left. Remainder of the lungs is clear. No pleural effusion or pneumothorax. Skeletal structures are grossly intact. IMPRESSION: 1. Subtle airspace opacities extending from the superior hila, left greater than right suspected to be multifocal pneumonia. Electronically Signed   By: Lajean Manes M.D.   On: 07/27/2021 10:34     Medications:     amitriptyline  25 mg Oral QHS   amLODipine  5 mg Oral Daily   atorvastatin  40 mg Oral q1800   carvedilol  6.25 mg Oral BID   Chlorhexidine Gluconate Cloth  6 each Topical Q0600   citalopram  20 mg Oral Daily   DULoxetine  60 mg Oral Daily   epoetin (EPOGEN/PROCRIT) injection  10,000 Units Intravenous Q M,W,F-HD   heparin  5,000 Units Subcutaneous Q8H   insulin aspart  0-6 Units Subcutaneous TID WC   insulin glargine-yfgn  6 Units Subcutaneous QHS   pantoprazole  40 mg Oral Daily   sevelamer carbonate  1,600 mg Oral TID WC   cyclobenzaprine, HYDROmorphone (DILAUDID) injection, nicotine, ondansetron **OR** ondansetron (ZOFRAN) IV, oxyCODONE-acetaminophen, polyethylene glycol  Assessment/ Plan:  Ms. IVANNIA WILLHELM is a 56 y.o.  female with past medical history including hyperlipidemia, hypertension, left BKA, diabetes, chronic pain syndrome, and end-stage renal disease on hemodialysis, who was admitted to [facility] on 07/21/2021 for Acidosis [E87.20] Hyperkalemia [E87.5] Elevated BUN [R79.9] ESRD (end stage renal disease) on dialysis ([place]) [N18.6, Z99.2] Femur fracture, left (HCC) [S72.92XA] Closed fracture of left femur, unspecified fracture morphology, unspecified portion of femur, initial encounter ([place]) [S72.92XA] Closed fracture of left distal femur ([place]) [S72.402A]  [facility]  [place]/MWF/left aVF/83 kg  End-stage renal disease on dialysis.  Will maintain outpatient schedule if possible.   Dialysis yesterday with UF 1.5 L achieved.  Next treatment scheduled for Monday. Dialysis coordinator currently seeking outpatient placement due to rehab placement.  2. Anemia of chronic kidney disease Normocytic Lab Results  Component Value Date   HGB 7.2 (L) 07/27/2021   HGB 7.4 (L) 07/27/2021   Hemoglobin below desired goal  Continue EPO 10000 unit IV with dialysis.   3. Secondary Hyperparathyroidism:  Lab Results  Component Value Date   PTH 44 01/11/2020   PTH Comment 01/11/2020   CALCIUM 8.8 (L) 07/27/2021   CAION 1.10 (L) 04/07/2017   PHOS 5.1 (H) 02/20/2020   Calcium and phosphorus remain within acceptable range.  Sevelamer ordered with meals  4.  Hypertension with chronic kidney disease.  Home regimen includes  amlodipine, carvedilol and hydralazine.  Currently receiving these medications.  Blood pressure 131/57  5. Diabetes mellitus type II with chronic kidney disease: insulin dependent. Home regimen includes to send NovoLog 70/30. Most recent hemoglobin A1c is 6.2 on 12/29/19.           Primary team to manage SSI   LOS: 5 [place] 5/27/202311:38 AM

## 2021-07-27 NOTE — Assessment & Plan Note (Signed)
   Increased SSI and basal insulin today

## 2021-07-27 NOTE — Progress Notes (Signed)
Physical Therapy Treatment Patient Details Name: [name] MRN: [id number] DOB: 08-02-1965 Today's Date: 07/27/2021   History of Present Illness Pt is a 56 y/o F admitted on 07/21/21 after presenting to ED for c/c of pain from falling during transfer from w/c to recliner ~1 week ago & pt missed dialysis for a whole week. Pt found to ahve distal L femur fx & deemed no surgery by orthopedics but NWB LLE. PMH: HTN, HLD, ESRD on HD MWF, L BKA in 2021, depression, anxiety, insulin-dependent DM, insomnia, chronic pain syndromes    PT Comments    Pt was awake, long sitting in bed upon arriving. Pre medicated ~ 1 hour prior to session as requested. She agrees to session but continues to be severely limited by pain. Increased time to perform all desired task due to pain. Pt was able to exit R side of bed with mod assist but required max assist to return to supine. She stood to RW 2 x ~ 2 minutes and was able to pivot towards [facility] on RLE with increased time. Author questions pt's efforts throughout however she was pleasant this date and more cooperative overall. PT recommends rehab at DC to address deficits while maximizing independence with ADLs. Planning to DC to rehab once dialysis chair time confirmed.    Recommendations for follow up therapy are one component of a multi-disciplinary discharge planning process, led by the attending physician.  Recommendations may be updated based on patient status, additional functional criteria and insurance authorization.  Follow Up Recommendations  Skilled nursing-short term rehab (<3 hours/day)     Assistance Recommended at Discharge Frequent or constant Supervision/Assistance  Patient can return home with the following A lot of help with walking and/or transfers;A lot of help with bathing/dressing/bathroom;Assistance with cooking/housework;Direct supervision/assist for medications management;Direct supervision/assist for financial management;Assist for  transportation;Help with stairs or ramp for entrance   Equipment Recommendations  None recommended by PT       Precautions / Restrictions Precautions Precautions: Fall Restrictions Weight Bearing Restrictions: Yes LLE Weight Bearing: Non weight bearing Other Position/Activity Restrictions: pt also with chronic L BKA     Mobility  Bed Mobility Overal bed mobility: Needs Assistance Bed Mobility: Rolling, Sidelying to Sit, Supine to Sit, Sit to Supine Rolling: Mod assist Sidelying to sit: Mod assist Supine to sit: Mod assist Sit to supine: Max assist   General bed mobility comments: pt is extremely slow moving due to pain. Author questions pt's effort throughout    Transfers Overall transfer level: Needs assistance Equipment used: Rolling walker (2 wheels) Transfers: Sit to/from Stand Sit to Stand: Min assist, Mod assist, From elevated surface           General transfer comment: pt stood 2 x EOB and was able to pivot towards HOB once in standing with use of RW. very slow moving due to pain    Ambulation/Gait    General Gait Details: per pt, non ambulatory at baseline. stand pivots to chair     Balance Overall balance assessment: Needs assistance Sitting-balance support: Bilateral upper extremity supported, Feet supported Sitting balance-Leahy Scale: Good     Standing balance support: Bilateral upper extremity supported, During functional activity, Reliant on assistive device for balance Standing balance-Leahy Scale: Fair Standing balance comment: reliant on BUE support to maintain standing balance. stood 2 x ~ 2 minutes each trial. prolonged rest between     Cognition Arousal/Alertness: Awake/alert Behavior During Therapy: WFL for tasks assessed/performed Overall Cognitive Status: No family/caregiver present  to determine baseline cognitive functioning      General Comments: Pt is A and O x4. needs alot of encouragement to participate           General  Comments General comments (skin integrity, edema, etc.): author encouraged increased activity throughoutthe day. She states understanding." I'll try my best. I will get better but will take some time."      Pertinent Vitals/Pain Pain Assessment Pain Assessment: 0-10 Pain Score: 7  Faces Pain Scale: Hurts whole lot Pain Location: L LE Pain Descriptors / Indicators: Discomfort, Spasm Pain Intervention(s): Limited activity within patient's tolerance, Monitored during session, Premedicated before session, Repositioned     PT Goals (current goals can now be found in the care plan section) Acute Rehab PT Goals Patient Stated Goal: less pain with movement Progress towards PT goals: Progressing toward goals    Frequency    7X/week      PT Plan Current plan remains appropriate       AM-PAC PT "6 Clicks" Mobility   Outcome Measure  Help needed turning from your back to your side while in a flat bed without using bedrails?: A Lot Help needed moving from lying on your back to sitting on the side of a flat bed without using bedrails?: A Lot Help needed moving to and from a bed to a chair (including a wheelchair)?: A Lot Help needed standing up from a chair using your arms (e.g., wheelchair or bedside chair)?: Total Help needed to walk in hospital room?: Total Help needed climbing 3-5 steps with a railing? : Total 6 Click Score: 9    End of Session   Activity Tolerance: Patient limited by pain Patient left: in bed;with bed alarm set;with call bell/phone within reach;with nursing/sitter in room (remains unwilling to sit in chair/recliner even when encouraged to do so) Nurse Communication: Mobility status PT Visit Diagnosis: Difficulty in walking, not elsewhere classified (R26.2);Other abnormalities of gait and mobility (R26.89);Muscle weakness (generalized) (M62.81);Other (comment);Pain Pain - Right/Left: Left Pain - part of body: Leg     Time: 3300-7622 PT Time Calculation  (min) (ACUTE ONLY): 24 min  Charges:  $Therapeutic Activity: 23-37 mins                     Julaine Fusi PTA 07/27/21, 2:23 PM

## 2021-07-28 DIAGNOSIS — N390 Urinary tract infection, site not specified: Secondary | ICD-10-CM | POA: Diagnosis present

## 2021-07-28 DIAGNOSIS — S72352A Displaced comminuted fracture of shaft of left femur, initial encounter for closed fracture: Secondary | ICD-10-CM | POA: Diagnosis not present

## 2021-07-28 DIAGNOSIS — L0291 Cutaneous abscess, unspecified: Secondary | ICD-10-CM | POA: Diagnosis present

## 2021-07-28 DIAGNOSIS — N186 End stage renal disease: Secondary | ICD-10-CM | POA: Diagnosis not present

## 2021-07-28 DIAGNOSIS — S72492A Other fracture of lower end of left femur, initial encounter for closed fracture: Secondary | ICD-10-CM | POA: Diagnosis not present

## 2021-07-28 DIAGNOSIS — F419 Anxiety disorder, unspecified: Secondary | ICD-10-CM | POA: Diagnosis not present

## 2021-07-28 LAB — GLUCOSE, CAPILLARY
Glucose-Capillary: 439 mg/dL — ABNORMAL HIGH (ref 70–99)
Glucose-Capillary: 403 mg/dL — ABNORMAL HIGH (ref 70–99)
Glucose-Capillary: 415 mg/dL — ABNORMAL HIGH (ref 70–99)
Glucose-Capillary: 373 mg/dL — ABNORMAL HIGH (ref 70–99)
Glucose-Capillary: 256 mg/dL — ABNORMAL HIGH (ref 70–99)

## 2021-07-28 LAB — CBC
HCT: 22.2 % — ABNORMAL LOW (ref 36.0–46.0)
Hemoglobin: 6.7 g/dL — ABNORMAL LOW (ref 12.0–15.0)
MCH: 28.9 pg (ref 26.0–34.0)
MCHC: 30.2 g/dL (ref 30.0–36.0)
MCV: 95.7 fL (ref 80.0–100.0)
Platelets: 350 10*3/uL (ref 150–400)
RBC: 2.32 MIL/uL — ABNORMAL LOW (ref 3.87–5.11)
RDW: 16 % — ABNORMAL HIGH (ref 11.5–15.5)
WBC: 26.9 10*3/uL — ABNORMAL HIGH (ref 4.0–10.5)
nRBC: 0 % (ref 0.0–0.2)

## 2021-07-28 LAB — ABO/RH: ABO/RH(D): A POS

## 2021-07-28 LAB — URINALYSIS, ROUTINE W REFLEX MICROSCOPIC
Bilirubin Urine: NEGATIVE
Glucose, UA: 50 mg/dL — AB
Hgb urine dipstick: NEGATIVE
Ketones, ur: NEGATIVE mg/dL
Nitrite: NEGATIVE
Protein, ur: 100 mg/dL — AB
Specific Gravity, Urine: 1.019 (ref 1.005–1.030)
WBC, UA: >50 WBC/hpf — ABNORMAL HIGH (ref 0–5)
pH: 5 (ref 5.0–8.0)

## 2021-07-28 LAB — BASIC METABOLIC PANEL
Anion gap: 10 (ref 5–15)
BUN: 74 mg/dL — ABNORMAL HIGH (ref 6–20)
CO2: 26 mmol/L (ref 22–32)
Calcium: 8.7 mg/dL — ABNORMAL LOW (ref 8.9–10.3)
Chloride: 92 mmol/L — ABNORMAL LOW (ref 98–111)
Creatinine, Ser: 7.87 mg/dL — ABNORMAL HIGH (ref 0.44–1.00)
GFR, Estimated: 6 mL/min — ABNORMAL LOW (ref >60)
Glucose, Bld: 437 mg/dL — ABNORMAL HIGH (ref 70–99)
Potassium: 4.3 mmol/L (ref 3.5–5.1)
Sodium: 128 mmol/L — ABNORMAL LOW (ref 135–145)

## 2021-07-28 LAB — PREPARE RBC (CROSSMATCH)

## 2021-07-28 LAB — GLUCOSE, RANDOM: Glucose, Bld: 443 mg/dL — ABNORMAL HIGH (ref 70–99)

## 2021-07-28 MED ORDER — INSULIN ASPART 100 UNIT/ML IJ SOLN
10.0000 [IU] | Freq: Three times a day (TID) | INTRAMUSCULAR | Status: DC
  Administered 2021-07-28: 10 [IU] via SUBCUTANEOUS
  Filled 2021-07-28: qty 1

## 2021-07-28 MED ORDER — SODIUM CHLORIDE 0.9% IV SOLUTION: Freq: Once | INTRAVENOUS | Status: AC

## 2021-07-28 MED ORDER — INSULIN GLARGINE-YFGN 100 UNIT/ML ~~LOC~~ SOLN
12.0000 [IU] | Freq: Every day | SUBCUTANEOUS | Status: DC
  Administered 2021-07-28 – 2021-07-29 (×2): 12 [IU] via SUBCUTANEOUS
  Filled 2021-07-28 (×3): qty 0.12

## 2021-07-28 MED ORDER — INSULIN ASPART 100 UNIT/ML IJ SOLN
0.0000 [IU] | Freq: Three times a day (TID) | INTRAMUSCULAR | Status: DC
  Administered 2021-07-28: 9 [IU] via SUBCUTANEOUS
  Administered 2021-07-29 (×2): 5 [IU] via SUBCUTANEOUS
  Administered 2021-07-30: 9 [IU] via SUBCUTANEOUS
  Administered 2021-07-30: 5 [IU] via SUBCUTANEOUS
  Administered 2021-07-30: 7 [IU] via SUBCUTANEOUS
  Administered 2021-07-31 (×2): 2 [IU] via SUBCUTANEOUS
  Administered 2021-07-31: 5 [IU] via SUBCUTANEOUS
  Administered 2021-08-01: 7 [IU] via SUBCUTANEOUS
  Administered 2021-08-01: 2 [IU] via SUBCUTANEOUS
  Filled 2021-07-28 (×11): qty 1

## 2021-07-28 NOTE — Assessment & Plan Note (Addendum)
See photo  Drained copious purulent fluid, sent for wound culture   Awaiting XR and MRI to eval for septic arthritis

## 2021-07-28 NOTE — Progress Notes (Signed)
[facility]  ROUNDING NOTE   Subjective:   Patient resting comfortably in bed Alert and oriented Appetite remains appropriate without nausea and vomiting Remains on room air Complains of 7-8/10 pain at left stump, recently received pain medication.    Objective:  Vital signs in last 24 hours:  Temp:  [97.6 F (36.4 C)-99.1 F (37.3 C)] 98.6 F (37 C) (05/28 0747) Pulse Rate:  [71-78] 73 (05/28 0747) Resp:  [16-17] 16 (05/28 0747) BP: (121-144)/(49-56) 143/56 (05/28 0747) SpO2:  [96 %] 96 % (05/28 0747)  Weight change:  Filed Weights   07/24/21 1235 07/26/21 0916 07/26/21 1248  Weight: 82 kg 82.1 kg 80.7 kg    Intake/Output: I/O last 3 completed shifts: In: 316.7 [IV Piggyback:316.7] Out: 0    Intake/Output this shift:  Total I/O In: 240 [P.O.:240] Out: -   Physical Exam: General: NAD  Head: Normocephalic, atraumatic. Moist oral mucosal membranes  Eyes: Anicteric  Lungs:  Clear to auscultation,normal effort  Heart: Regular rate and rhythm  Abdomen:  Soft, nontender  Extremities:  No peripheral edema. Lt BKA  Neurologic: Nonfocal, moving all four extremities  Skin: No lesions  Access: Lt AVF    Basic Metabolic Panel: Recent Labs  Lab 07/22/21 0256 07/23/21 1127 07/25/21 0509 07/27/21 0433 07/28/21 0401 07/28/21 0822  NA 129* 132* 133* 132* 128*  --   K 5.7* 4.4 3.8 4.0 4.3  --   CL 87* 92* 94* 94* 92*  --   CO2 15* 23 27 28 26   --   GLUCOSE 98 210* 220* 321* 437* 443*  BUN 156* 89* 59* 60* 74*  --   CREATININE 14.55* 9.20* 7.28* 6.79* 7.87*  --   CALCIUM 8.1* 8.3* 8.7* 8.8* 8.7*  --      Liver Function Tests: Recent Labs  Lab 07/21/21 1729  AST 12*  ALT 8  ALKPHOS 192*  BILITOT 1.3*  PROT 7.7  ALBUMIN 2.9*    No results for input(s): LIPASE, AMYLASE in the last 168 hours. No results for input(s): AMMONIA in the last 168 hours.  CBC: Recent Labs  Lab 07/22/21 0256 07/23/21 0418 07/25/21 0509 07/27/21 0433  07/28/21 0401  WBC 19.1* 13.4* 16.1*  16.8* 25.0*  24.1* 26.9*  NEUTROABS  --   --  10.5* 17.2*  --   HGB 8.4* 7.8* 7.6*  7.6* 7.4*  7.2* 6.7*  HCT 25.4* 24.2* 24.0*  23.8* 23.9*  23.7* 22.2*  MCV 87.6 88.3 92.0  92.2 94.5  93.7 95.7  PLT 256 249 287  286 351  333 350     Cardiac Enzymes: No results for input(s): CKTOTAL, CKMB, CKMBINDEX, TROPONINI in the last 168 hours.  BNP: Invalid input(s): POCBNP  CBG: Recent Labs  Lab 07/27/21 0843 07/27/21 1203 07/27/21 1627 07/27/21 2057 07/28/21 0811  GLUCAP 292* 361* 303* 286* 439*     Microbiology: Results for orders placed or performed during the hospital encounter of 07/21/21  Culture, blood (Routine X 2) w Reflex to ID Panel     Status: None   Collection Time: 07/22/21  2:44 PM   Specimen: Right Antecubital; Blood  Result Value Ref Range Status   Specimen Description RIGHT ANTECUBITAL  Final   Special Requests   Final    BOTTLES DRAWN AEROBIC AND ANAEROBIC Blood Culture adequate volume   Culture   Final    NO GROWTH 5 DAYS Performed at [facility], [address] [place], [place] [address]    Report  Status 07/27/2021 FINAL  Final  Culture, blood (Routine X 2) w Reflex to ID Panel     Status: None   Collection Time: 07/22/21  2:46 PM   Specimen: BLOOD RIGHT HAND  Result Value Ref Range Status   Specimen Description BLOOD RIGHT HAND  Final   Special Requests   Final    BOTTLES DRAWN AEROBIC AND ANAEROBIC Blood Culture adequate volume   Culture   Final    NO GROWTH 5 DAYS Performed at [facility], [address], [place], [place] [address]    Report Status 07/27/2021 FINAL  Final  MRSA Next Gen by PCR, Nasal     Status: None   Collection Time: 07/27/21 12:18 PM   Specimen: Nasal Mucosa; Nasal Swab  Result Value Ref Range Status   MRSA by PCR Next Gen NOT DETECTED NOT DETECTED Final    Comment: (NOTE) The GeneXpert MRSA Assay (FDA approved for NASAL specimens only), is one  component of a comprehensive MRSA colonization surveillance program. It is not intended to diagnose MRSA infection nor to guide or monitor treatment for MRSA infections. Test performance is not FDA approved in patients less than 72 years old. Performed at [facility], [address] [place], [place] [address]   Culture, blood (Routine X 2) w Reflex to ID Panel     Status: None (Preliminary result)   Collection Time: 07/27/21 12:31 PM   Specimen: BLOOD  Result Value Ref Range Status   Specimen Description BLOOD RIGHT ANTECUBITAL  Final   Special Requests   Final    BOTTLES DRAWN AEROBIC AND ANAEROBIC Blood Culture adequate volume   Culture   Final    NO GROWTH < 24 HOURS Performed at [facility], [address] [place], [place] [address]    Report Status PENDING  Incomplete  Culture, blood (Routine X 2) w Reflex to ID Panel     Status: None (Preliminary result)   Collection Time: 07/27/21 12:33 PM   Specimen: BLOOD  Result Value Ref Range Status   Specimen Description BLOOD BLOOD RIGHT HAND  Final   Special Requests   Final    BOTTLES DRAWN AEROBIC AND ANAEROBIC Blood Culture adequate volume   Culture   Final    NO GROWTH < 24 HOURS Performed at [facility], [address] [place], [place] [address]    Report Status PENDING  Incomplete    Coagulation Studies: No results for input(s): LABPROT, INR in the last 72 hours.  Urinalysis: No results for input(s): COLORURINE, LABSPEC, PHURINE, GLUCOSEU, HGBUR, BILIRUBINUR, KETONESUR, PROTEINUR, UROBILINOGEN, NITRITE, LEUKOCYTESUR in the last 72 hours.  Invalid input(s): APPERANCEUR    Imaging: DG Chest Port 1 View  Result Date: 07/27/2021 CLINICAL DATA:  Difficulty breathing.  No cough.  Leukocytosis. EXAM: PORTABLE CHEST 1 VIEW COMPARISON:  07/21/2021. FINDINGS: Cardiac silhouette is normal in size. No mediastinal or hilar masses. Subtle airspace opacity noted above both hila, greater on the  left. Remainder of the lungs is clear. No pleural effusion or pneumothorax. Skeletal structures are grossly intact. IMPRESSION: 1. Subtle airspace opacities extending from the superior hila, left greater than right suspected to be multifocal pneumonia. Electronically Signed   By: Lajean Manes M.D.   On: 07/27/2021 10:34     Medications:    azithromycin 500 mg (07/27/21 1423)   ceFEPime (MAXIPIME) IV 2 g (07/28/21 0540)    amitriptyline  25 mg Oral QHS   amLODipine  5 mg Oral Daily  atorvastatin  40 mg Oral q1800   carvedilol  6.25 mg Oral BID   Chlorhexidine Gluconate Cloth  6 each Topical Q0600   citalopram  20 mg Oral Daily   DULoxetine  60 mg Oral Daily   epoetin (EPOGEN/PROCRIT) injection  10,000 Units Intravenous Q M,W,F-HD   heparin  5,000 Units Subcutaneous Q8H   insulin aspart  10 Units Subcutaneous TID WC   insulin glargine-yfgn  8 Units Subcutaneous QHS   pantoprazole  40 mg Oral Daily   sevelamer carbonate  1,600 mg Oral TID WC   cyclobenzaprine, HYDROmorphone (DILAUDID) injection, nicotine, ondansetron **OR** ondansetron (ZOFRAN) IV, oxyCODONE-acetaminophen, polyethylene glycol  Assessment/ Plan:  [name] is a 56 y.o.  female with past medical history including hyperlipidemia, hypertension, left BKA, diabetes, chronic pain syndrome, and end-stage renal disease on hemodialysis, who was admitted to [facility] on 07/21/2021 for Acidosis [E87.20] Hyperkalemia [E87.5] Elevated BUN [R79.9] ESRD (end stage renal disease) on dialysis ([place]) [N18.6, Z99.2] Femur fracture, left (HCC) [S72.92XA] Closed fracture of left femur, unspecified fracture morphology, unspecified portion of femur, initial encounter ([place]) [S72.92XA] Closed fracture of left distal femur ([place]) [S72.402A]  [facility] [place]/MWF/left aVF/83 kg  End-stage renal disease on dialysis.  Will maintain outpatient schedule if possible.    Next treatment scheduled for Monday.  Dialysis coordinator currently  seeking outpatient dialysis clinic due to patient's rehab needs.  Search remains in progress  2. Anemia of chronic kidney disease Normocytic Lab Results  Component Value Date   HGB 6.7 (L) 07/28/2021   Hemoglobin not at goal. Continue EPO with dialysis. Will defer need of blood transfusion to primary team.   3. Secondary Hyperparathyroidism:  Lab Results  Component Value Date   PTH 44 01/11/2020   PTH Comment 01/11/2020   CALCIUM 8.7 (L) 07/28/2021   CAION 1.10 (L) 04/07/2017   PHOS 5.1 (H) 02/20/2020   Calcium and phosphorus at goal.  Sevelamer ordered with meals  4.  Hypertension with chronic kidney disease.  Home regimen includes amlodipine, carvedilol and hydralazine.  Currently receiving these medications.  Blood pressure stable, 143/56  5. Diabetes mellitus type II with chronic kidney disease: insulin dependent. Home regimen includes to send NovoLog 70/30. Most recent hemoglobin A1c is 6.2 on 12/29/19.           Glucose elevated this morning. Primary team to manage SSI   LOS: 6 [place] 5/28/202310:06 AM

## 2021-07-28 NOTE — Progress Notes (Signed)
Physical Therapy Treatment Patient Details Name: [name] MRN: [id number] DOB: 13-Feb-1966 Today's Date: 07/28/2021   History of Present Illness Pt is a 56 y/o F admitted on 07/21/21 after presenting to ED for c/c of pain from falling during transfer from w/c to recliner ~1 week ago & pt missed dialysis for a whole week. Pt found to ahve distal L femur fx & deemed no surgery by orthopedics but NWB LLE. PMH: HTN, HLD, ESRD on HD MWF, L BKA in 2021, depression, anxiety, insulin-dependent DM, insomnia, chronic pain syndromes    PT Comments    Initially declines therapy stating she has a new "boil" as she labels it that emerges every few weeks.  MD is aware per pt.  She voices concern over mobility and irritating the area further.  She does agree to RLE ex with education to increase activity on her own.  She voices understanding.  Overall tolerates session well and is encouraged to do HEP several times a day.     Recommendations for follow up therapy are one component of a multi-disciplinary discharge planning process, led by the attending physician.  Recommendations may be updated based on patient status, additional functional criteria and insurance authorization.  Follow Up Recommendations  Skilled nursing-short term rehab (<3 hours/day)     Assistance Recommended at Discharge    Patient can return home with the following A lot of help with walking and/or transfers;A lot of help with bathing/dressing/bathroom;Assistance with cooking/housework;Direct supervision/assist for medications management;Direct supervision/assist for financial management;Assist for transportation;Help with stairs or ramp for entrance   Equipment Recommendations  None recommended by PT    Recommendations for Other Services       Precautions / Restrictions Precautions Precautions: Fall Restrictions Weight Bearing Restrictions: Yes LLE Weight Bearing: Non weight bearing Other Position/Activity Restrictions:  pt also with chronic L BKA     Mobility  Bed Mobility               General bed mobility comments: declined due to new "boil" as she labels it on left lateral thigh.  stated it emerges every few weeks    Transfers                        Ambulation/Gait                   Stairs             Wheelchair Mobility    Modified Rankin (Stroke Patients Only)       Balance                                            Cognition Arousal/Alertness: Awake/alert Behavior During Therapy: WFL for tasks assessed/performed Overall Cognitive Status: Within Functional Limits for tasks assessed                                          Exercises Other Exercises Other Exercises: RLE AROM until fatigued for available ranges    General Comments        Pertinent Vitals/Pain Pain Assessment Pain Assessment: 0-10 Pain Score: 7  Pain Location: L LE Pain Descriptors / Indicators: Discomfort, Spasm Pain Intervention(s): Limited activity within patient's tolerance, Monitored during session, Premedicated before  session    Home Living                          Prior Function            PT Goals (current goals can now be found in the care plan section) Progress towards PT goals: Progressing toward goals    Frequency    7X/week      PT Plan Current plan remains appropriate    Co-evaluation              AM-PAC PT "6 Clicks" Mobility   Outcome Measure  Help needed turning from your back to your side while in a flat bed without using bedrails?: A Lot Help needed moving from lying on your back to sitting on the side of a flat bed without using bedrails?: A Lot Help needed moving to and from a bed to a chair (including a wheelchair)?: A Lot Help needed standing up from a chair using your arms (e.g., wheelchair or bedside chair)?: Total Help needed to walk in hospital room?: Total Help needed climbing  3-5 steps with a railing? : Total 6 Click Score: 9    End of Session   Activity Tolerance: Patient limited by pain Patient left: in bed;with bed alarm set;with call bell/phone within reach Nurse Communication: Mobility status PT Visit Diagnosis: Difficulty in walking, not elsewhere classified (R26.2);Other abnormalities of gait and mobility (R26.89);Muscle weakness (generalized) (M62.81);Other (comment);Pain Pain - Right/Left: Left Pain - part of body: Leg     Time: 4503-8882 PT Time Calculation (min) (ACUTE ONLY): 9 min  Charges:  $Therapeutic Exercise: 8-22 mins                   Chesley Noon, PTA 07/28/21, 12:49 PM

## 2021-07-28 NOTE — Progress Notes (Signed)
Inpatient Diabetes Program Recommendations  AACE/ADA: New Consensus Statement on Inpatient Glycemic Control (2015)  Target Ranges:  Prepandial:   less than 140 mg/dL      Peak postprandial:   less than 180 mg/dL (1-2 hours)      Critically ill patients:  140 - 180 mg/dL    Latest Reference Range & Units 07/27/21 08:43 07/27/21 12:03 07/27/21 16:27 07/27/21 20:57  Glucose-Capillary 70 - 99 mg/dL 292 (H)  3 units Novolog 361 (H)  5 units Novolog  303 (H)  12 units Novolog  286 (H)  3 units Novolog  8 units Semglee    Latest Reference Range & Units 07/28/21 08:11  Glucose-Capillary 70 - 99 mg/dL 439 (H)  14 units Novolog       Home DM Meds: Lantus 12 QHS      Novolog 70/30 Insulin 10 units TID  Current Orders: Semglee 8 units QHS    Novolog 10 units TID with meals    MD- Note Novolog Meal Coverage increased to 10 units TID with meals today (increased dose starts at 12pm w/ Lunch)  Note Novolog SSi stopped  Please consider:  1. Add back Novolog 0-9 units TID AC (to help determine how much extra Novolog pt requiring)  2. Increase Semglee to 12 units QHS (home dose)    --Will follow patient during hospitalization--  Wyn Quaker RN, MSN, CDE Diabetes Coordinator Inpatient Glycemic Control Team Team Pager: [phone] (8a-5p)

## 2021-07-28 NOTE — Plan of Care (Signed)

## 2021-07-28 NOTE — Progress Notes (Addendum)
PROGRESS NOTE    [name]  DTO:[id number] DOB: [birth year]  DOA: 07/21/2021 Date of Service: 07/28/21 PCP: Julian Hy, PA-C     Brief Narrative / Hospital Course:  [name] is a 56 year old female with history of hypertension, hyperlipidemia, end-stage renal disease on HD MWF, history of left BKA in 2021, depression, anxiety, insulin-dependent diabetes mellitus, insomnia, chronic pain syndrome, who presents emergency department for chief concerns of pain from falling during transfer from wheelchair to recliner about 1 week prior to admission. She missed dialysis for whole week.  Upon arriving the hospital 07/21/21, she was found to have a distal left femur fracture, deemed no surgery by orthopedics.  But no weightbearing.  Patient is also restarted on dialysis. As of today 07/28/21 waiting on patient to decide on SNF facility for rehab since she was initially advised inpatient rehab but later stated she did not think she would be able to participate in intensive therapy. Pending dialysis spot now but SNF placement is confirmed. WBC had been trending up, workup showed multifocal pneumonia on CXR despite no significant symptoms, started abx 07/27/21, pending UA/Ucx for completeness (still makes some urine), sputum Cx, blood Cx.   Consultants:  Nephrology Orthopedics  Procedures: Dialysis per routine     Subjective: Complains today of "boil" on L hip area, "this happens every month or so and drains clear yellow fluid ever since my hip surgery" - see photo below.      ASSESSMENT & PLAN:   Principal Problem:   Femur fracture, left (HCC) Active Problems:   TOBACCO DEPENDENCE   Anxiety and depression   Insomnia   Type 2 diabetes mellitus with renal complication (HCC)   Obesity (BMI 30.0-34.9)   Hyponatremia   ESRD on dialysis ([place])   Leukocytosis   Essential hypertension   History of CVA (cerebrovascular accident) without residual deficits    Hyperkalemia   Metabolic acidemia   Closed fracture of left distal femur ([place])   Uncontrolled type 2 diabetes mellitus with hypoglycemia, with long-term current use of insulin ([place])   HCAP (healthcare-associated pneumonia)   Skin lesion   UTI (urinary tract infection) Essential hypertension, benign - Patient takes amlodipine 5 mg daily, carvedilol 6.25 mg p.o. twice daily, these have been resumed for 07/22/2021 due to low normotensive in setting of requiring hemodialysis - Hydralazine 100 mg 3 times daily have not been resumed due to the same  Femur fracture, left [facility]) Orthopedic service recommends no surgery  Orthopedic service recommends brace in flexion of the left knee, nonweightbearing Patient to follow-up outpatient with orthopedic provider in 1 week after discharge rehab/SNF placement   Hyperkalemia Resolved Monitor bmp  Essential hypertension Amlodipine 5 mg daily, carvedilol 6.25 mg p.o. twice daily Hydralazine 5 mg IV every 6 hours.  For SBP greater than 180, 3 doses ordered  Anxiety and depression Amitriptyline 25 mg nightly, Celexa 20 mg daily, duloxetine 60 mg daily  TOBACCO DEPENDENCE - Nicotine patch as needed for nicotine craving ordered  ESRD on dialysis [facility]) Nephrology following  Leukocytosis WBC up and neutrophilia, CXR (+) multifocal pneumonia See A/P HCAP For completeness will also obtain UA/UCx (makes some urine)   HCAP (healthcare-associated pneumonia) WBC trending up, w/u reveals pneumonia on CXR despite no significant symptoms though pt does have smoker's cough, no SOB. Poorly controlled diabetes and pt's lack of effort w/ mobilization/inceptive spirometry likely contributing.  Starting cefepime, azithromycin (07/28/21 is day 2 of abx for HCAP) Makes some urine will screen strep and legionella  Ag MRSA screen negative Following CBC   Type 2 diabetes mellitus with renal complication (HCC) Increased SSI and basal insulin today   Skin lesion See  photo Does not appear infected  Will see if I can drain this at bedside and send for culture If not successful / if concern for infectious process, will get ortho to see   UTI (urinary tract infection) UA able to be collected 07/28/21 Already on cefepime for HCAP Urine culture pending         DVT prophylaxis: heparin HELD d/t anemia Code Status: FULL Family Communication: daughter at bedside Disposition Plan / TOC needs: need HD spot  Barriers to discharge / significant pending items: SNF placement arranged, needs HD spot, now on IV abx for HCAP              Objective: Vitals:   07/27/21 1553 07/27/21 2008 07/28/21 0349 07/28/21 0747  BP: (!) 121/52 (!) 144/53 (!) 134/49 (!) 143/56  Pulse: 74 78 71 73  Resp:  17 16 16   Temp: 97.6 F (36.4 C) 99.1 F (37.3 C) 98.1 F (36.7 C) 98.6 F (37 C)  TempSrc:   Oral   SpO2: 96% 96% 96% 96%  Weight:      Height:        Intake/Output Summary (Last 24 hours) at 07/28/2021 1249 Last data filed at 07/28/2021 0816 Gross per 24 hour  Intake 556.67 ml  Output 0 ml  Net 556.67 ml   Filed Weights   07/24/21 1235 07/26/21 0916 07/26/21 1248  Weight: 82 kg 82.1 kg 80.7 kg    Examination:  Constitutional:  VS as above General Appearance: alert, well-developed, well-nourished, NAD Eyes: Normal lids and conjunctive, non-icteric sclera Ears, Nose, Mouth, Throat: Normal appearance MMM Neck: No masses, trachea midline Respiratory: Normal respiratory effort Breath sounds normal, no wheeze/rhonchi/rales Cardiovascular: S1/S2 normal, RRR No lower extremity edema on RLE Gastrointestinal: Nontender, no masses Musculoskeletal:  No clubbing/cyanosis of digits Neurological: No cranial nerve deficit on limited exam Psychiatric: Normal judgment/insight Normal mood and affect       Scheduled Medications:   amitriptyline  25 mg Oral QHS   amLODipine  5 mg Oral Daily   atorvastatin  40 mg Oral q1800    carvedilol  6.25 mg Oral BID   Chlorhexidine Gluconate Cloth  6 each Topical Q0600   citalopram  20 mg Oral Daily   DULoxetine  60 mg Oral Daily   epoetin (EPOGEN/PROCRIT) injection  10,000 Units Intravenous Q M,W,F-HD   heparin  5,000 Units Subcutaneous Q8H   insulin aspart  0-9 Units Subcutaneous TID WC   insulin glargine-yfgn  12 Units Subcutaneous QHS   pantoprazole  40 mg Oral Daily   sevelamer carbonate  1,600 mg Oral TID WC    Continuous Infusions:  azithromycin 500 mg (07/28/21 1228)   ceFEPime (MAXIPIME) IV 2 g (07/28/21 0540)    PRN Medications:  cyclobenzaprine, HYDROmorphone (DILAUDID) injection, nicotine, ondansetron **OR** ondansetron (ZOFRAN) IV, oxyCODONE-acetaminophen, polyethylene glycol  Antimicrobials:  Anti-infectives (From admission, onward)    Start     Dose/Rate Route Frequency Ordered Stop   07/27/21 1400  ceFEPIme (MAXIPIME) 2 g in sodium chloride 0.9 % 100 mL IVPB        2 g 200 mL/hr over 30 Minutes Intravenous Every 8 hours 07/27/21 1219 08/01/21 1359   07/27/21 1315  azithromycin (ZITHROMAX) 500 mg in sodium chloride 0.9 % 250 mL IVPB        500 mg 250  mL/hr over 60 Minutes Intravenous Every 24 hours 07/27/21 1219 08/01/21 1314       Data Reviewed: I have personally reviewed following labs and imaging studies  CBC: Recent Labs  Lab 07/22/21 0256 07/23/21 0418 07/25/21 0509 07/27/21 0433 07/28/21 0401  WBC 19.1* 13.4* 16.1*  16.8* 25.0*  24.1* 26.9*  NEUTROABS  --   --  10.5* 17.2*  --   HGB 8.4* 7.8* 7.6*  7.6* 7.4*  7.2* 6.7*  HCT 25.4* 24.2* 24.0*  23.8* 23.9*  23.7* 22.2*  MCV 87.6 88.3 92.0  92.2 94.5  93.7 95.7  PLT 256 249 287  286 351  333 382   Basic Metabolic Panel: Recent Labs  Lab 07/22/21 0256 07/23/21 1127 07/25/21 0509 07/27/21 0433 07/28/21 0401 07/28/21 0822  NA 129* 132* 133* 132* 128*  --   K 5.7* 4.4 3.8 4.0 4.3  --   CL 87* 92* 94* 94* 92*  --   CO2 15* 23 27 28 26   --   GLUCOSE 98 210* 220*  321* 437* 443*  BUN 156* 89* 59* 60* 74*  --   CREATININE 14.55* 9.20* 7.28* 6.79* 7.87*  --   CALCIUM 8.1* 8.3* 8.7* 8.8* 8.7*  --    GFR: Estimated Creatinine Clearance: 8.8 mL/min (A) (by C-G formula based on SCr of 7.87 mg/dL (H)). Liver Function Tests: Recent Labs  Lab 07/21/21 1729  AST 12*  ALT 8  ALKPHOS 192*  BILITOT 1.3*  PROT 7.7  ALBUMIN 2.9*   No results for input(s): LIPASE, AMYLASE in the last 168 hours. No results for input(s): AMMONIA in the last 168 hours. Coagulation Profile: No results for input(s): INR, PROTIME in the last 168 hours. Cardiac Enzymes: No results for input(s): CKTOTAL, CKMB, CKMBINDEX, TROPONINI in the last 168 hours. BNP (last 3 results) No results for input(s): PROBNP in the last 8760 hours. HbA1C: No results for input(s): HGBA1C in the last 72 hours.  CBG: Recent Labs  Lab 07/27/21 1203 07/27/21 1627 07/27/21 2057 07/28/21 0811 07/28/21 1156  GLUCAP 361* 303* 286* 439* 403*   Lipid Profile: No results for input(s): CHOL, HDL, LDLCALC, TRIG, CHOLHDL, LDLDIRECT in the last 72 hours. Thyroid Function Tests: No results for input(s): TSH, T4TOTAL, FREET4, T3FREE, THYROIDAB in the last 72 hours. Anemia Panel: No results for input(s): VITAMINB12, FOLATE, FERRITIN, TIBC, IRON, RETICCTPCT in the last 72 hours. Urine analysis:    Component Value Date/Time   COLORURINE YELLOW 01/19/2020 1402   APPEARANCEUR TURBID (A) 01/19/2020 1402   LABSPEC 1.010 01/19/2020 1402   PHURINE 6.0 01/19/2020 1402   GLUCOSEU 50 (A) 01/19/2020 1402   GLUCOSEU NEGATIVE 03/29/2014 1259   HGBUR NEGATIVE 01/19/2020 1402   BILIRUBINUR NEGATIVE 01/19/2020 1402   BILIRUBINUR neg 02/16/2017 1501   KETONESUR NEGATIVE 01/19/2020 1402   PROTEINUR 100 (A) 01/19/2020 1402   UROBILINOGEN 0.2 02/16/2017 1501   UROBILINOGEN 0.2 03/29/2014 1259   NITRITE NEGATIVE 01/19/2020 1402   LEUKOCYTESUR TRACE (A) 01/19/2020 1402   Sepsis  Labs: @LABRCNTIP (procalcitonin:4,lacticidven:4)  Recent Results (from the past 240 hour(s))  Culture, blood (Routine X 2) w Reflex to ID Panel     Status: None   Collection Time: 07/22/21  2:44 PM   Specimen: Right Antecubital; Blood  Result Value Ref Range Status   Specimen Description RIGHT ANTECUBITAL  Final   Special Requests   Final    BOTTLES DRAWN AEROBIC AND ANAEROBIC Blood Culture adequate volume   Culture   Final  NO GROWTH 5 DAYS Performed at [facility], [address] [place], [place] [address]    Report Status 07/27/2021 FINAL  Final  Culture, blood (Routine X 2) w Reflex to ID Panel     Status: None   Collection Time: 07/22/21  2:46 PM   Specimen: BLOOD RIGHT HAND  Result Value Ref Range Status   Specimen Description BLOOD RIGHT HAND  Final   Special Requests   Final    BOTTLES DRAWN AEROBIC AND ANAEROBIC Blood Culture adequate volume   Culture   Final    NO GROWTH 5 DAYS Performed at [facility], [address] [place], [place] [address]    Report Status 07/27/2021 FINAL  Final  MRSA Next Gen by PCR, Nasal     Status: None   Collection Time: 07/27/21 12:18 PM   Specimen: Nasal Mucosa; Nasal Swab  Result Value Ref Range Status   MRSA by PCR Next Gen NOT DETECTED NOT DETECTED Final    Comment: (NOTE) The GeneXpert MRSA Assay (FDA approved for NASAL specimens only), is one component of a comprehensive MRSA colonization surveillance program. It is not intended to diagnose MRSA infection nor to guide or monitor treatment for MRSA infections. Test performance is not FDA approved in patients less than 27 years old. Performed at [facility], [address] [place], [place] [address]   Culture, blood (Routine X 2) w Reflex to ID Panel     Status: None (Preliminary result)   Collection Time: 07/27/21 12:31 PM   Specimen: BLOOD  Result Value Ref Range Status   Specimen Description BLOOD RIGHT ANTECUBITAL  Final   Special  Requests   Final    BOTTLES DRAWN AEROBIC AND ANAEROBIC Blood Culture adequate volume   Culture   Final    NO GROWTH < 24 HOURS Performed at [facility], [address] [place], [place] [address]    Report Status PENDING  Incomplete  Culture, blood (Routine X 2) w Reflex to ID Panel     Status: None (Preliminary result)   Collection Time: 07/27/21 12:33 PM   Specimen: BLOOD  Result Value Ref Range Status   Specimen Description BLOOD BLOOD RIGHT HAND  Final   Special Requests   Final    BOTTLES DRAWN AEROBIC AND ANAEROBIC Blood Culture adequate volume   Culture   Final    NO GROWTH < 24 HOURS Performed at [facility], [address], [place], [place] [address]    Report Status PENDING  Incomplete         Radiology Studies last 96 hours: DG Chest Port 1 View  Result Date: 07/27/2021 CLINICAL DATA:  Difficulty breathing.  No cough.  Leukocytosis. EXAM: PORTABLE CHEST 1 VIEW COMPARISON:  07/21/2021. FINDINGS: Cardiac silhouette is normal in size. No mediastinal or hilar masses. Subtle airspace opacity noted above both hila, greater on the left. Remainder of the lungs is clear. No pleural effusion or pneumothorax. Skeletal structures are grossly intact. IMPRESSION: 1. Subtle airspace opacities extending from the superior hila, left greater than right suspected to be multifocal pneumonia. Electronically Signed   By: Lajean Manes M.D.   On: 07/27/2021 10:34            LOS: 6 days     Emeterio Reeve, DO [facility] 07/28/2021, 12:49 PM   Staff may message me via secure chat in Mobile  but this may not receive immediate response,  please page for urgent matters!  If  7PM-7AM, please contact night-coverage [email or web address]  Dictation software was used to generate the above note. Typos may occur and escape review, as with typed/written notes. Please contact Dr Sheppard Coil directly for clarity if needed.

## 2021-07-28 NOTE — Assessment & Plan Note (Addendum)
UA able to be collected 07/28/21, and remain negative  Patient received antibiotics for concern of pneumonia and left septic hip

## 2021-07-28 NOTE — Progress Notes (Addendum)
To bedside today to trial draining what appeared to be cyst over L hip. Plan was for skin sterilization and drain w/ 18G needle, when I exposed the area a cyst/abscess had already ruptured, purulent material drained onto bed, I cleaned area and manually expressed more pus, uncertain amount total since had already drained into bedding but would guess 10-20cc total. Culture obtained. Will ask ortho to reassess for septic joint or septic bursitis - pt states this is a recurrent issue and has hx surgical repair to L hip

## 2021-07-29 ENCOUNTER — Inpatient Hospital Stay: Payer: Medicare Other

## 2021-07-29 DIAGNOSIS — S72352A Displaced comminuted fracture of shaft of left femur, initial encounter for closed fracture: Secondary | ICD-10-CM | POA: Diagnosis not present

## 2021-07-29 DIAGNOSIS — F419 Anxiety disorder, unspecified: Secondary | ICD-10-CM | POA: Diagnosis not present

## 2021-07-29 DIAGNOSIS — N186 End stage renal disease: Secondary | ICD-10-CM | POA: Diagnosis not present

## 2021-07-29 DIAGNOSIS — S72492A Other fracture of lower end of left femur, initial encounter for closed fracture: Secondary | ICD-10-CM | POA: Diagnosis not present

## 2021-07-29 LAB — CBC
HCT: 23.6 % — ABNORMAL LOW (ref 36.0–46.0)
Hemoglobin: 7.4 g/dL — ABNORMAL LOW (ref 12.0–15.0)
MCH: 29.2 pg (ref 26.0–34.0)
MCHC: 31.4 g/dL (ref 30.0–36.0)
MCV: 93.3 fL (ref 80.0–100.0)
Platelets: 309 10*3/uL (ref 150–400)
RBC: 2.53 MIL/uL — ABNORMAL LOW (ref 3.87–5.11)
RDW: 16.8 % — ABNORMAL HIGH (ref 11.5–15.5)
WBC: 23.8 10*3/uL — ABNORMAL HIGH (ref 4.0–10.5)
nRBC: 0 % (ref 0.0–0.2)

## 2021-07-29 LAB — BASIC METABOLIC PANEL
Anion gap: 8 (ref 5–15)
BUN: 89 mg/dL — ABNORMAL HIGH (ref 6–20)
CO2: 24 mmol/L (ref 22–32)
Calcium: 8.9 mg/dL (ref 8.9–10.3)
Chloride: 96 mmol/L — ABNORMAL LOW (ref 98–111)
Creatinine, Ser: 9.03 mg/dL — ABNORMAL HIGH (ref 0.44–1.00)
GFR, Estimated: 5 mL/min — ABNORMAL LOW (ref >60)
Glucose, Bld: 314 mg/dL — ABNORMAL HIGH (ref 70–99)
Potassium: 5 mmol/L (ref 3.5–5.1)
Sodium: 128 mmol/L — ABNORMAL LOW (ref 135–145)

## 2021-07-29 LAB — GLUCOSE, CAPILLARY
Glucose-Capillary: 307 mg/dL — ABNORMAL HIGH (ref 70–99)
Glucose-Capillary: 204 mg/dL — ABNORMAL HIGH (ref 70–99)
Glucose-Capillary: 284 mg/dL — ABNORMAL HIGH (ref 70–99)
Glucose-Capillary: 331 mg/dL — ABNORMAL HIGH (ref 70–99)

## 2021-07-29 LAB — TYPE AND SCREEN
ABO/RH(D): A POS
Antibody Screen: NEGATIVE
Unit division: 0

## 2021-07-29 LAB — BPAM RBC
Blood Product Expiration Date: 202306022359
ISSUE DATE / TIME: 202305281518
Unit Type and Rh: 600

## 2021-07-29 LAB — STREP PNEUMONIAE URINARY ANTIGEN: Strep Pneumo Urinary Antigen: NEGATIVE

## 2021-07-29 LAB — HEPATITIS B CORE ANTIBODY, TOTAL: Hep B Core Total Ab: NONREACTIVE

## 2021-07-29 MED ORDER — EPOETIN ALFA 10000 UNIT/ML IJ SOLN
INTRAMUSCULAR | Status: AC
  Filled 2021-07-29: qty 1

## 2021-07-29 MED ORDER — SODIUM CHLORIDE 0.9 % IV SOLN
1.0000 g | INTRAVENOUS | Status: DC
  Filled 2021-07-29: qty 10

## 2021-07-29 NOTE — Progress Notes (Signed)
Full consult note to follow. Imaging and chart reviewed. Patient appears to have multiple abscesses about L hip IM nail. 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   Plan for OR tomorrow (5/30) for possible L hip I&D, removal of hardware, and placement of antibiotic beads NPO after midnight Please medically optimize as able.

## 2021-07-29 NOTE — Care Management Important Message (Signed)
Important Message  Patient Details  Name: POLINA BURMASTER MRN: [id number] Date of Birth: [birth year]   Medicare Important Message Given:  Yes  Patient out of room upon time of visit.  Copy of Medicare IM left on beside tray for reference.    Dannette Barbara 07/29/2021, 10:24 AM

## 2021-07-29 NOTE — Progress Notes (Signed)
OT Cancellation Note  Patient Details Name: KAITYLN KALLSTROM MRN: [id number] DOB: 1966-01-31   Cancelled Treatment:    Reason Eval/Treat Not Completed: Patient at procedure or test/ unavailable. Pt currently off the floor for HD and unavailable to OT tx. OT to re-attempt at later time/date as able.  Lorre Nick D Travarus Trudo 07/29/2021, 1:18 PM

## 2021-07-29 NOTE — Progress Notes (Signed)
PROGRESS NOTE    [name]  KYH:[id number] DOB: February 04, 1966  DOA: 07/21/2021 Date of Service: 07/29/21 PCP: Julian Hy, PA-C     Brief Narrative / Hospital Course:  [name] is a 56 year old female with history of hypertension, hyperlipidemia, end-stage renal disease on HD MWF, history of left BKA in 2021, depression, anxiety, insulin-dependent diabetes mellitus, insomnia, chronic pain syndrome, who presents emergency department for chief concerns of pain from falling during transfer from wheelchair to recliner about 1 week prior to admission. She missed dialysis for whole week.  Upon arriving the hospital 07/21/21, she was found to have a distal left femur fracture, deemed no surgery by orthopedics.  But no weightbearing.  Patient is also restarted on dialysis. Was waiting on patient to decide on SNF facility for rehab since she was initially advised inpatient rehab but later stated she did not think she would be able to participate in intensive therapy. Pending dialysis spot but SNF placement was confirmed. Discharge was not considered however as WBC had been trending up, workup showed multifocal pneumonia on CXR despite no significant symptoms, started abx 07/27/21, obtained UA for completeness (still makes some urine and this was obtained 05/28 and abnormal), Urine Cx, sputum Cx, blood Cx. Also 05/28 drained superficial abscess L hip and sent for culture, d/w ortho 05/29 and obtaining XR and MRI to eval for L hip septic arthritis    Consultants:  Nephrology Orthopedics  Procedures: Dialysis per routine     Subjective: Seen in dialysis suite today, no concerns. Denies CP/SOB. Feeling tired.      ASSESSMENT & PLAN:   Principal Problem:   Femur fracture, left (HCC) Active Problems:   TOBACCO DEPENDENCE   Anxiety and depression   Insomnia   Type 2 diabetes mellitus with renal complication (HCC)   Obesity (BMI 30.0-34.9)   Hyponatremia   ESRD on  dialysis ([place])   Leukocytosis   Essential hypertension   History of CVA (cerebrovascular accident) without residual deficits   Hyperkalemia   Metabolic acidemia   Closed fracture of left distal femur ([place])   Uncontrolled type 2 diabetes mellitus with hypoglycemia, with long-term current use of insulin ([place])   HCAP (healthcare-associated pneumonia)   Skin abscess L hip   UTI (urinary tract infection) Essential hypertension, benign - Patient takes amlodipine 5 mg daily, carvedilol 6.25 mg p.o. twice daily, these have been resumed for 07/22/2021 due to low normotensive in setting of requiring hemodialysis - Hydralazine 100 mg 3 times daily have not been resumed due to the same  Femur fracture, left [facility]) Orthopedic service recommends no surgery  Orthopedic service recommends brace in flexion of the left knee, nonweightbearing Patient to follow-up outpatient with orthopedic provider in 1 week after discharge rehab/SNF placement   Hyperkalemia Resolved Monitor bmp  Essential hypertension Amlodipine 5 mg daily, carvedilol 6.25 mg p.o. twice daily Hydralazine 5 mg IV every 6 hours.  For SBP greater than 180, 3 doses ordered  Anxiety and depression Amitriptyline 25 mg nightly, Celexa 20 mg daily, duloxetine 60 mg daily  TOBACCO DEPENDENCE - Nicotine patch as needed for nicotine craving ordered  ESRD on dialysis [facility]) Nephrology following  Leukocytosis CXR (+) multifocal pneumonia, UA abnormal, also w/u for septic arthritis   HCAP (healthcare-associated pneumonia) WBC trending up, w/u reveals pneumonia on CXR despite no significant symptoms though pt does have smoker's cough, no SOB. Poorly controlled diabetes and pt's lack of effort w/ mobilization/inceptive spirometry likely contributing.  Starting cefepime, azithromycin (07/29/21 is  day 3 of abx for HCAP) Makes some urine will screen strep (neg) and legionella (pending) Ag MRSA screen negative Following CBC   Type 2 diabetes  mellitus with renal complication (HCC) Increased SSI and basal insulin today   Skin abscess L hip See photo Drained copious purulent fluid, sent for wound culture  Awaiting XR and MRI to eval for septic arthritis  UTI (urinary tract infection) UA able to be collected 07/28/21 Already on cefepime for HCAP Urine culture pending         DVT prophylaxis: heparin HELD d/t anemia Code Status: FULL Family Communication: daughter at bedside Disposition Plan / TOC needs: need HD spot  Barriers to discharge / significant pending items: SNF placement arranged, needs HD spot, now on IV abx for HCAP, UTI, eval for septic L hip - will still work on getting HD spot outpatient but discharge date unknown               Objective: Vitals:   07/29/21 1245 07/29/21 1300 07/29/21 1315 07/29/21 1413  BP: (!) 139/45 (!) 149/49 (!) 108/51 (!) 156/56  Pulse: 88 85 89 85  Resp:    17  Temp:    98.4 F (36.9 C)  TempSrc:      SpO2:    97%  Weight:      Height:        Intake/Output Summary (Last 24 hours) at 07/29/2021 1441 Last data filed at 07/29/2021 0900 Gross per 24 hour  Intake 620 ml  Output 30 ml  Net 590 ml   Filed Weights   07/26/21 0916 07/26/21 1248 07/29/21 0959  Weight: 82.1 kg 80.7 kg 86.2 kg    Examination:  Constitutional:  VS as above General Appearance: alert, well-developed, well-nourished, NAD Eyes: Normal lids and conjunctive, non-icteric sclera Ears, Nose, Mouth, Throat: Normal appearance MMM Neck: No masses, trachea midline Respiratory: Normal respiratory effort Cardiovascular: No lower extremity edema on RLE Neurological: No cranial nerve deficit on limited exam Psychiatric: Normal judgment/insight Normal mood and affect       Scheduled Medications:   amitriptyline  25 mg Oral QHS   amLODipine  5 mg Oral Daily   atorvastatin  40 mg Oral q1800   carvedilol  6.25 mg Oral BID   Chlorhexidine Gluconate Cloth  6 each Topical Q0600    citalopram  20 mg Oral Daily   DULoxetine  60 mg Oral Daily   epoetin alfa       epoetin (EPOGEN/PROCRIT) injection  10,000 Units Intravenous Q M,W,F-HD   insulin aspart  0-9 Units Subcutaneous TID WC   insulin glargine-yfgn  12 Units Subcutaneous QHS   pantoprazole  40 mg Oral Daily   sevelamer carbonate  1,600 mg Oral TID WC    Continuous Infusions:  azithromycin 500 mg (07/29/21 1433)   [START ON 07/30/2021] ceFEPime (MAXIPIME) IV      PRN Medications:  cyclobenzaprine, HYDROmorphone (DILAUDID) injection, nicotine, ondansetron **OR** ondansetron (ZOFRAN) IV, oxyCODONE-acetaminophen, polyethylene glycol  Antimicrobials:  Anti-infectives (From admission, onward)    Start     Dose/Rate Route Frequency Ordered Stop   07/30/21 2000  ceFEPIme (MAXIPIME) 1 g in sodium chloride 0.9 % 100 mL IVPB        1 g 200 mL/hr over 30 Minutes Intravenous Every 24 hours 07/29/21 0658     07/27/21 1400  ceFEPIme (MAXIPIME) 2 g in sodium chloride 0.9 % 100 mL IVPB  Status:  Discontinued        2 g  200 mL/hr over 30 Minutes Intravenous Every 8 hours 07/27/21 1219 07/29/21 0658   07/27/21 1315  azithromycin (ZITHROMAX) 500 mg in sodium chloride 0.9 % 250 mL IVPB        500 mg 250 mL/hr over 60 Minutes Intravenous Every 24 hours 07/27/21 1219 08/01/21 1314       Data Reviewed: I have personally reviewed following labs and imaging studies  CBC: Recent Labs  Lab 07/23/21 0418 07/25/21 0509 07/27/21 0433 07/28/21 0401 07/29/21 0420  WBC 13.4* 16.1*  16.8* 25.0*  24.1* 26.9* 23.8*  NEUTROABS  --  10.5* 17.2*  --   --   HGB 7.8* 7.6*  7.6* 7.4*  7.2* 6.7* 7.4*  HCT 24.2* 24.0*  23.8* 23.9*  23.7* 22.2* 23.6*  MCV 88.3 92.0  92.2 94.5  93.7 95.7 93.3  PLT 249 287  286 351  333 350 326   Basic Metabolic Panel: Recent Labs  Lab 07/23/21 1127 07/25/21 0509 07/27/21 0433 07/28/21 0401 07/28/21 0822 07/29/21 0420  NA 132* 133* 132* 128*  --  128*  K 4.4 3.8 4.0 4.3  --  5.0  CL  92* 94* 94* 92*  --  96*  CO2 23 27 28 26   --  24  GLUCOSE 210* 220* 321* 437* 443* 314*  BUN 89* 59* 60* 74*  --  89*  CREATININE 9.20* 7.28* 6.79* 7.87*  --  9.03*  CALCIUM 8.3* 8.7* 8.8* 8.7*  --  8.9   GFR: Estimated Creatinine Clearance: 8.5 mL/min (A) (by C-G formula based on SCr of 9.03 mg/dL (H)). Liver Function Tests: No results for input(s): AST, ALT, ALKPHOS, BILITOT, PROT, ALBUMIN in the last 168 hours.  No results for input(s): LIPASE, AMYLASE in the last 168 hours. No results for input(s): AMMONIA in the last 168 hours. Coagulation Profile: No results for input(s): INR, PROTIME in the last 168 hours. Cardiac Enzymes: No results for input(s): CKTOTAL, CKMB, CKMBINDEX, TROPONINI in the last 168 hours. BNP (last 3 results) No results for input(s): PROBNP in the last 8760 hours. HbA1C: No results for input(s): HGBA1C in the last 72 hours.  CBG: Recent Labs  Lab 07/28/21 1156 07/28/21 1353 07/28/21 1716 07/28/21 2121 07/29/21 0733  GLUCAP 403* 415* 373* 256* 307*   Lipid Profile: No results for input(s): CHOL, HDL, LDLCALC, TRIG, CHOLHDL, LDLDIRECT in the last 72 hours. Thyroid Function Tests: No results for input(s): TSH, T4TOTAL, FREET4, T3FREE, THYROIDAB in the last 72 hours. Anemia Panel: No results for input(s): VITAMINB12, FOLATE, FERRITIN, TIBC, IRON, RETICCTPCT in the last 72 hours. Urine analysis:    Component Value Date/Time   COLORURINE AMBER (A) 07/28/2021 0940   APPEARANCEUR CLOUDY (A) 07/28/2021 0940   LABSPEC 1.019 07/28/2021 0940   PHURINE 5.0 07/28/2021 0940   GLUCOSEU 50 (A) 07/28/2021 0940   GLUCOSEU NEGATIVE 03/29/2014 1259   HGBUR NEGATIVE 07/28/2021 0940   BILIRUBINUR NEGATIVE 07/28/2021 0940   BILIRUBINUR neg 02/16/2017 1501   KETONESUR NEGATIVE 07/28/2021 0940   PROTEINUR 100 (A) 07/28/2021 0940   UROBILINOGEN 0.2 02/16/2017 1501   UROBILINOGEN 0.2 03/29/2014 1259   NITRITE NEGATIVE 07/28/2021 0940   LEUKOCYTESUR LARGE (A)  07/28/2021 0940   Sepsis Labs: @LABRCNTIP (procalcitonin:4,lacticidven:4)  Recent Results (from the past 240 hour(s))  Culture, blood (Routine X 2) w Reflex to ID Panel     Status: None   Collection Time: 07/22/21  2:44 PM   Specimen: Right Antecubital; Blood  Result Value Ref Range Status   Specimen Description RIGHT  ANTECUBITAL  Final   Special Requests   Final    BOTTLES DRAWN AEROBIC AND ANAEROBIC Blood Culture adequate volume   Culture   Final    NO GROWTH 5 DAYS Performed at [facility], [address] [place], [place] [address]    Report Status 07/27/2021 FINAL  Final  Culture, blood (Routine X 2) w Reflex to ID Panel     Status: None   Collection Time: 07/22/21  2:46 PM   Specimen: BLOOD RIGHT HAND  Result Value Ref Range Status   Specimen Description BLOOD RIGHT HAND  Final   Special Requests   Final    BOTTLES DRAWN AEROBIC AND ANAEROBIC Blood Culture adequate volume   Culture   Final    NO GROWTH 5 DAYS Performed at [facility], [address], [place], [place] [address]    Report Status 07/27/2021 FINAL  Final  MRSA Next Gen by PCR, Nasal     Status: None   Collection Time: 07/27/21 12:18 PM   Specimen: Nasal Mucosa; Nasal Swab  Result Value Ref Range Status   MRSA by PCR Next Gen NOT DETECTED NOT DETECTED Final    Comment: (NOTE) The GeneXpert MRSA Assay (FDA approved for NASAL specimens only), is one component of a comprehensive MRSA colonization surveillance program. It is not intended to diagnose MRSA infection nor to guide or monitor treatment for MRSA infections. Test performance is not FDA approved in patients less than 56 years old. Performed at [facility], [address] [place], [place] [address]   Culture, blood (Routine X 2) w Reflex to ID Panel     Status: None (Preliminary result)   Collection Time: 07/27/21 12:31 PM   Specimen: BLOOD  Result Value Ref Range Status   Specimen Description BLOOD RIGHT  ANTECUBITAL  Final   Special Requests   Final    BOTTLES DRAWN AEROBIC AND ANAEROBIC Blood Culture adequate volume   Culture   Final    NO GROWTH 2 DAYS Performed at [facility], [address], [place], [place] [address]    Report Status PENDING  Incomplete  Culture, blood (Routine X 2) w Reflex to ID Panel     Status: None (Preliminary result)   Collection Time: 07/27/21 12:33 PM   Specimen: BLOOD  Result Value Ref Range Status   Specimen Description BLOOD BLOOD RIGHT HAND  Final   Special Requests   Final    BOTTLES DRAWN AEROBIC AND ANAEROBIC Blood Culture adequate volume   Culture   Final    NO GROWTH 2 DAYS Performed at [facility], [address], [place], [place] [address]    Report Status PENDING  Incomplete         Radiology Studies last 96 hours: DG Chest Port 1 View  Result Date: 07/27/2021 CLINICAL DATA:  Difficulty breathing.  No cough.  Leukocytosis. EXAM: PORTABLE CHEST 1 VIEW COMPARISON:  07/21/2021. FINDINGS: Cardiac silhouette is normal in size. No mediastinal or hilar masses. Subtle airspace opacity noted above both hila, greater on the left. Remainder of the lungs is clear. No pleural effusion or pneumothorax. Skeletal structures are grossly intact. IMPRESSION: 1. Subtle airspace opacities extending from the superior hila, left greater than right suspected to be multifocal pneumonia. Electronically Signed   By: Lajean Manes M.D.   On: 07/27/2021 10:34            LOS: 7 days     Emeterio Reeve, DO [facility] 07/29/2021,  2:41 PM   Staff may message me via secure chat in [place]  but this may not receive immediate response,  please page for urgent matters!  If 7PM-7AM, please contact night-coverage [email or web address]  Dictation software was used to generate the above note. Typos may occur and escape review, as with typed/written notes. Please contact Dr Sheppard Coil directly for clarity if needed.

## 2021-07-29 NOTE — Progress Notes (Signed)
[facility]  ROUNDING NOTE   Subjective:   Patient seen and evaluated during dialysis   HEMODIALYSIS FLOWSHEET:  Blood Flow Rate (mL/min): 400 mL/min Arterial Pressure (mmHg): -190 mmHg Venous Pressure (mmHg): 160 mmHg Transmembrane Pressure (mmHg): 60 mmHg Ultrafiltration Rate (mL/min): 680 mL/min (Rounding Nephrology Team increased UF target) Dialysate Flow Rate (mL/min): 500 ml/min Conductivity: Machine : 14 Conductivity: Machine : 14 Dialysis Fluid Bolus: Normal Saline Bolus Amount (mL): 250 mL Dialysate Change: Other (comment) (bath changed to 3K)  Pain well managed with prescribed medications   Objective:  Vital signs in last 24 hours:  Temp:  [98.1 F (36.7 C)-99 F (37.2 C)] 98.6 F (37 C) (05/29 0732) Pulse Rate:  [63-84] 64 (05/29 0732) Resp:  [14-18] 16 (05/29 0732) BP: (128-159)/(51-61) 134/57 (05/29 0732) SpO2:  [95 %-98 %] 95 % (05/29 1030) Weight:  [86.2 kg] 86.2 kg (05/29 0959)  Weight change:  Filed Weights   07/26/21 0916 07/26/21 1248 07/29/21 0959  Weight: 82.1 kg 80.7 kg 86.2 kg    Intake/Output: I/O last 3 completed shifts: In: 903.3 [P.O.:240; Blood:380; IV Piggyback:283.3] Out: 30 [Urine:30]   Intake/Output this shift:  No intake/output data recorded.  Physical Exam: General: NAD  Head: Normocephalic, atraumatic. Moist oral mucosal membranes  Eyes: Anicteric  Lungs:  Clear to auscultation,normal effort  Heart: Regular rate and rhythm  Abdomen:  Soft, nontender  Extremities:  No peripheral edema. Lt BKA  Neurologic: Nonfocal, moving all four extremities  Skin: No lesions  Access: Lt AVF    Basic Metabolic Panel: Recent Labs  Lab 07/23/21 1127 07/25/21 0509 07/27/21 0433 07/28/21 0401 07/28/21 0822 07/29/21 0420  NA 132* 133* 132* 128*  --  128*  K 4.4 3.8 4.0 4.3  --  5.0  CL 92* 94* 94* 92*  --  96*  CO2 23 27 28 26   --  24  GLUCOSE 210* 220* 321* 437* 443* 314*  BUN 89* 59* 60* 74*  --  89*   CREATININE 9.20* 7.28* 6.79* 7.87*  --  9.03*  CALCIUM 8.3* 8.7* 8.8* 8.7*  --  8.9     Liver Function Tests: No results for input(s): AST, ALT, ALKPHOS, BILITOT, PROT, ALBUMIN in the last 168 hours.  No results for input(s): LIPASE, AMYLASE in the last 168 hours. No results for input(s): AMMONIA in the last 168 hours.  CBC: Recent Labs  Lab 07/23/21 0418 07/25/21 0509 07/27/21 0433 07/28/21 0401 07/29/21 0420  WBC 13.4* 16.1*  16.8* 25.0*  24.1* 26.9* 23.8*  NEUTROABS  --  10.5* 17.2*  --   --   HGB 7.8* 7.6*  7.6* 7.4*  7.2* 6.7* 7.4*  HCT 24.2* 24.0*  23.8* 23.9*  23.7* 22.2* 23.6*  MCV 88.3 92.0  92.2 94.5  93.7 95.7 93.3  PLT 249 287  286 351  333 350 309     Cardiac Enzymes: No results for input(s): CKTOTAL, CKMB, CKMBINDEX, TROPONINI in the last 168 hours.  BNP: Invalid input(s): POCBNP  CBG: Recent Labs  Lab 07/28/21 1156 07/28/21 1353 07/28/21 1716 07/28/21 2121 07/29/21 0733  GLUCAP 403* 415* 373* 256* 307*     Microbiology: Results for orders placed or performed during the hospital encounter of 07/21/21  Culture, blood (Routine X 2) w Reflex to ID Panel     Status: None   Collection Time: 07/22/21  2:44 PM   Specimen: Right Antecubital; Blood  Result Value Ref Range Status   Specimen Description RIGHT ANTECUBITAL  Final  Special Requests   Final    BOTTLES DRAWN AEROBIC AND ANAEROBIC Blood Culture adequate volume   Culture   Final    NO GROWTH 5 DAYS Performed at [facility], [address] [place], [place] [address]    Report Status 07/27/2021 FINAL  Final  Culture, blood (Routine X 2) w Reflex to ID Panel     Status: None   Collection Time: 07/22/21  2:46 PM   Specimen: BLOOD RIGHT HAND  Result Value Ref Range Status   Specimen Description BLOOD RIGHT HAND  Final   Special Requests   Final    BOTTLES DRAWN AEROBIC AND ANAEROBIC Blood Culture adequate volume   Culture   Final    NO GROWTH 5 DAYS Performed at  [facility], [address] [place], [place] [address]    Report Status 07/27/2021 FINAL  Final  MRSA Next Gen by PCR, Nasal     Status: None   Collection Time: 07/27/21 12:18 PM   Specimen: Nasal Mucosa; Nasal Swab  Result Value Ref Range Status   MRSA by PCR Next Gen NOT DETECTED NOT DETECTED Final    Comment: (NOTE) The GeneXpert MRSA Assay (FDA approved for NASAL specimens only), is one component of a comprehensive MRSA colonization surveillance program. It is not intended to diagnose MRSA infection nor to guide or monitor treatment for MRSA infections. Test performance is not FDA approved in patients less than 45 years old. Performed at [facility], [address] [place], [place] [address]   Culture, blood (Routine X 2) w Reflex to ID Panel     Status: None (Preliminary result)   Collection Time: 07/27/21 12:31 PM   Specimen: BLOOD  Result Value Ref Range Status   Specimen Description BLOOD RIGHT ANTECUBITAL  Final   Special Requests   Final    BOTTLES DRAWN AEROBIC AND ANAEROBIC Blood Culture adequate volume   Culture   Final    NO GROWTH 2 DAYS Performed at [facility], [address], [place], [place] [address]    Report Status PENDING  Incomplete  Culture, blood (Routine X 2) w Reflex to ID Panel     Status: None (Preliminary result)   Collection Time: 07/27/21 12:33 PM   Specimen: BLOOD  Result Value Ref Range Status   Specimen Description BLOOD BLOOD RIGHT HAND  Final   Special Requests   Final    BOTTLES DRAWN AEROBIC AND ANAEROBIC Blood Culture adequate volume   Culture   Final    NO GROWTH 2 DAYS Performed at [facility], [address] [place], [place] [address]    Report Status PENDING  Incomplete    Coagulation Studies: No results for input(s): LABPROT, INR in the last 72 hours.  Urinalysis: Recent Labs    07/28/21 0940  COLORURINE AMBER*  LABSPEC 1.019  PHURINE 5.0  GLUCOSEU 50*  HGBUR NEGATIVE   BILIRUBINUR NEGATIVE  KETONESUR NEGATIVE  PROTEINUR 100*  NITRITE NEGATIVE  LEUKOCYTESUR LARGE*      Imaging: No results found.   Medications:    azithromycin 500 mg (07/28/21 1228)   [START ON 07/30/2021] ceFEPime (MAXIPIME) IV      amitriptyline  25 mg Oral QHS   amLODipine  5 mg Oral Daily   atorvastatin  40 mg Oral q1800   carvedilol  6.25 mg Oral BID   Chlorhexidine Gluconate Cloth  6 each Topical Q0600   citalopram  20 mg Oral Daily   DULoxetine  60 mg Oral Daily   epoetin (EPOGEN/PROCRIT) injection  10,000 Units Intravenous Q M,W,F-HD   insulin aspart  0-9 Units Subcutaneous TID WC   insulin glargine-yfgn  12 Units Subcutaneous QHS   pantoprazole  40 mg Oral Daily   sevelamer carbonate  1,600 mg Oral TID WC   cyclobenzaprine, HYDROmorphone (DILAUDID) injection, nicotine, ondansetron **OR** ondansetron (ZOFRAN) IV, oxyCODONE-acetaminophen, polyethylene glycol  Assessment/ Plan:  [name] is a 56 y.o.  female with past medical history including hyperlipidemia, hypertension, left BKA, diabetes, chronic pain syndrome, and end-stage renal disease on hemodialysis, who was admitted to [facility] on 07/21/2021 for Acidosis [E87.20] Hyperkalemia [E87.5] Elevated BUN [R79.9] ESRD (end stage renal disease) on dialysis ([place]) [N18.6, Z99.2] Femur fracture, left (HCC) [S72.92XA] Closed fracture of left femur, unspecified fracture morphology, unspecified portion of femur, initial encounter ([place]) [S72.92XA] Closed fracture of left distal femur (HCC) [S72.402A]  [facility]/MWF/left aVF/83 kg  End-stage renal disease on dialysis.  Will maintain outpatient schedule if possible.    Currently receiving hemodialysis, UF goal 1.5 L as tolerated.  Next treatment scheduled for Wednesday. Dialysis coordinator currently seeking outpatient dialysis clinic due to patient's rehab needs.  Search remains in progress  2. Anemia of chronic kidney disease Normocytic Lab Results   Component Value Date   HGB 7.4 (L) 07/29/2021   Hemoglobin below desired target. Continue EPO with dialysis.   3. Secondary Hyperparathyroidism:  Lab Results  Component Value Date   PTH 44 01/11/2020   PTH Comment 01/11/2020   CALCIUM 8.9 07/29/2021   CAION 1.10 (L) 04/07/2017   PHOS 5.1 (H) 02/20/2020   Bone minerals remain within acceptable range.  Continue sevelamer ordered with meals  4.  Hypertension with chronic kidney disease.  Home regimen includes amlodipine, carvedilol and hydralazine.  Currently receiving these medications.  Blood pressure 109/46 during dialysis  5. Diabetes mellitus type II with chronic kidney disease: insulin dependent. Home regimen includes to send NovoLog 70/30. Most recent hemoglobin A1c is 6.2 on 12/29/19.          Primary team to manage SSI   LOS: 7 [place] 5/29/202310:57 AM

## 2021-07-29 NOTE — Progress Notes (Signed)
Inpatient Diabetes Program Recommendations  AACE/ADA: New Consensus Statement on Inpatient Glycemic Control (2015)  Target Ranges:  Prepandial:   less than 140 mg/dL      Peak postprandial:   less than 180 mg/dL (1-2 hours)      Critically ill patients:  140 - 180 mg/dL    Latest Reference Range & Units 07/28/21 08:11 07/28/21 11:56 07/28/21 13:53 07/28/21 17:16 07/28/21 21:21  Glucose-Capillary 70 - 99 mg/dL 439 (H)  14 units Novolog  403 (H)  10 units Novolog  415 (H) 373 (H)  9 units Novolog  256 (H)    12 units Semglee  (H): Data is abnormally high  Latest Reference Range & Units 07/29/21 07:33  Glucose-Capillary 70 - 99 mg/dL 307 (H)  (H): Data is abnormally high    Home DM Meds: Lantus 12 QHS      Novolog 70/30 Insulin 10 units TID    Current Orders: Semglee 12 units QHS    Novolog Sensitive Correction Scale/ SSI (0-9 units) TID AC     MD- Note Novolog Meal Coverage stopped yesterday   Note Novolog SSi added back  Semglee increased to home dose last PM  CBGs remain >200   Please consider:   1. Add back Novolog Meal Coverage: Novolog 5 units TID with meals to start   2. Increase Semglee to 15 units QHS   3. If CBGs continue to stay severely elevated, may consider increasing the frequency of the Novolog SSI to Q4 hours as well    --Will follow patient during hospitalization--  Wyn Quaker RN, MSN, CDE Diabetes Coordinator Inpatient Glycemic Control Team Team Pager: [phone] (8a-5p)

## 2021-07-29 NOTE — Progress Notes (Incomplete Revision)
Full consult note to follow. Imaging and chart reviewed. Patient appears to have multiple abscesses about L hip IM nail. 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   I called the patient this evening to discuss symptoms and findings. Patient states she has had chronic "boils" that occur every ~1-2 months on the lateral aspect of the hip. She has no pain until boil forms. These usually burst and there is release of infectious material. We discussed that ideally we would plan for OR for potential L hip I&D, removal of hardware, and placement of antibiotic beads. This would likely be the only way to eradicate the infection. However, patient had a strong desire to avoid surgery given that she is relatively asymptomatic from a pain/function standpoint in the hip and she has been living with this for ~3-4 years. Additionally, she states she has not been ambulatory since BKA in 2019. Furthermore, she has new L distal femur fracture sustaind ~2 weeks ago that is being treated nonoperatively. We discussed that nonsurgical management could consist of chronic antibiotics. Patient felt that she would prefer the nonsurgical route, but we will plan to reassess in the AM.  NPO after midnight in case of OR.  Please medically optimize as able. Formal decision for surgical vs. Nonsurgical management to be made tomorrow AM.

## 2021-07-29 NOTE — Progress Notes (Signed)
PHARMACY NOTE:  ANTIMICROBIAL RENAL DOSAGE ADJUSTMENT  Current antimicrobial regimen includes a mismatch between antimicrobial dosage and estimated renal function.  As per policy approved by the Pharmacy & Therapeutics and Medical Executive Committees, the antimicrobial dosage will be adjusted accordingly.  Current antimicrobial dosage:  cefepime 2 grams IV every 8 hours  Indication: PNA  Renal Function:  Estimated Creatinine Clearance: 7.7 mL/min (A) (by C-G formula based on SCr of 9.03 mg/dL (H)). [x]      On intermittent HD, scheduled: []      On CRRT    Antimicrobial dosage has been changed to:  cefepime 1 gram IV every 24 hours  Thank you for allowing pharmacy to be a part of this patient's care.  Dallie Piles, [facility] 07/29/2021 6:55 AM

## 2021-07-29 NOTE — Progress Notes (Addendum)
PT Cancellation Note  Patient Details Name: [name] MRN: [id number] DOB: 1965-03-24   Cancelled Treatment:    Reason Eval/Treat Not Completed: Patient at procedure or test/unavailable Attempted to see pt for PT tx but pt noted to be off unit for hemodialysis. Will f/u as able.  Addendum: Attempted to see pt at 2:30PM with pt received in bed & politely declining PT (pt declines attempting to get OOB to recliner for lunch) with pt reporting she recently returned from dialysis, needs to eat lunch & is scheduled for an MRI. Will f/u as able.  Lavone Nian, PT, DPT 07/29/21, 2:31 PM    Waunita Schooner 07/29/2021, 10:08 AM

## 2021-07-30 DIAGNOSIS — M009 Pyogenic arthritis, unspecified: Secondary | ICD-10-CM | POA: Diagnosis present

## 2021-07-30 DIAGNOSIS — L0291 Cutaneous abscess, unspecified: Secondary | ICD-10-CM

## 2021-07-30 DIAGNOSIS — S72352A Displaced comminuted fracture of shaft of left femur, initial encounter for closed fracture: Secondary | ICD-10-CM | POA: Diagnosis not present

## 2021-07-30 DIAGNOSIS — F419 Anxiety disorder, unspecified: Secondary | ICD-10-CM | POA: Diagnosis not present

## 2021-07-30 DIAGNOSIS — M86452 Chronic osteomyelitis with draining sinus, left femur: Secondary | ICD-10-CM | POA: Diagnosis not present

## 2021-07-30 DIAGNOSIS — S72492A Other fracture of lower end of left femur, initial encounter for closed fracture: Secondary | ICD-10-CM | POA: Diagnosis not present

## 2021-07-30 DIAGNOSIS — N186 End stage renal disease: Secondary | ICD-10-CM | POA: Diagnosis not present

## 2021-07-30 LAB — URINE CULTURE: Culture: NO GROWTH

## 2021-07-30 LAB — CBC
HCT: 25.3 % — ABNORMAL LOW (ref 36.0–46.0)
Hemoglobin: 7.7 g/dL — ABNORMAL LOW (ref 12.0–15.0)
MCH: 28.7 pg (ref 26.0–34.0)
MCHC: 30.4 g/dL (ref 30.0–36.0)
MCV: 94.4 fL (ref 80.0–100.0)
Platelets: 299 10*3/uL (ref 150–400)
RBC: 2.68 MIL/uL — ABNORMAL LOW (ref 3.87–5.11)
RDW: 17 % — ABNORMAL HIGH (ref 11.5–15.5)
WBC: 17.3 10*3/uL — ABNORMAL HIGH (ref 4.0–10.5)
nRBC: 0 % (ref 0.0–0.2)

## 2021-07-30 LAB — GLUCOSE, CAPILLARY
Glucose-Capillary: 280 mg/dL — ABNORMAL HIGH (ref 70–99)
Glucose-Capillary: 362 mg/dL — ABNORMAL HIGH (ref 70–99)
Glucose-Capillary: 360 mg/dL — ABNORMAL HIGH (ref 70–99)

## 2021-07-30 LAB — BASIC METABOLIC PANEL
Anion gap: 7 (ref 5–15)
BUN: 42 mg/dL — ABNORMAL HIGH (ref 6–20)
CO2: 28 mmol/L (ref 22–32)
Calcium: 8.9 mg/dL (ref 8.9–10.3)
Chloride: 97 mmol/L — ABNORMAL LOW (ref 98–111)
Creatinine, Ser: 5.24 mg/dL — ABNORMAL HIGH (ref 0.44–1.00)
GFR, Estimated: 9 mL/min — ABNORMAL LOW (ref >60)
Glucose, Bld: 372 mg/dL — ABNORMAL HIGH (ref 70–99)
Potassium: 4 mmol/L (ref 3.5–5.1)
Sodium: 132 mmol/L — ABNORMAL LOW (ref 135–145)

## 2021-07-30 LAB — LEGIONELLA PNEUMOPHILA SEROGP 1 UR AG: L. pneumophila Serogp 1 Ur Ag: NEGATIVE

## 2021-07-30 LAB — PATHOLOGIST SMEAR REVIEW

## 2021-07-30 MED ORDER — INSULIN GLARGINE-YFGN 100 UNIT/ML ~~LOC~~ SOLN
15.0000 [IU] | Freq: Every day | SUBCUTANEOUS | Status: DC
  Administered 2021-07-30: 15 [IU] via SUBCUTANEOUS
  Filled 2021-07-30 (×2): qty 0.15

## 2021-07-30 MED ORDER — SODIUM CHLORIDE 0.9 % IV SOLN
500.0000 mg | INTRAVENOUS | Status: DC
  Administered 2021-07-30 – 2021-08-06 (×8): 500 mg via INTRAVENOUS
  Filled 2021-07-30: qty 500
  Filled 2021-07-30: qty 10
  Filled 2021-07-30 (×4): qty 500
  Filled 2021-07-30 (×2): qty 10
  Filled 2021-07-30: qty 500

## 2021-07-30 MED ORDER — INSULIN ASPART 100 UNIT/ML IJ SOLN
5.0000 [IU] | Freq: Three times a day (TID) | INTRAMUSCULAR | Status: DC
  Administered 2021-07-30 – 2021-08-01 (×4): 5 [IU] via SUBCUTANEOUS
  Filled 2021-07-30 (×5): qty 1

## 2021-07-30 NOTE — Progress Notes (Signed)
Inpatient Diabetes Program Recommendations  AACE/ADA: New Consensus Statement on Inpatient Glycemic Control (2015)  Target Ranges:  Prepandial:   less than 140 mg/dL      Peak postprandial:   less than 180 mg/dL (1-2 hours)      Critically ill patients:  140 - 180 mg/dL   Lab Results  Component Value Date   GLUCAP 331 (H) 07/29/2021   HGBA1C 7.1 (H) 07/22/2021    Review of Glycemic Control  Home DM Meds: Lantus 12 QHS      Novolog 70/30 Insulin 10 units TID     Current Orders: Semglee 12 units QHS    Novolog Sensitive Correction Scale/ SSI (0-9 units) TID AC     CBGs remain >200   Please consider:   1. Add back Novolog Meal Coverage: Novolog 5 units TID with meals to start   2. Increase Semglee to 15 units QHS    3. If CBGs continue to stay severely elevated, may consider increasing the frequency of the Novolog SSI to Q4 hours as well  Secure chat to Dr. Sheppard Coil.  Thank you, Nani Gasser. Donna Sanna, RN, MSN, CDE  Diabetes Coordinator Inpatient Glycemic Control Team Team Pager [phone] (8am-5pm) 07/30/2021 9:42 AM

## 2021-07-30 NOTE — Progress Notes (Signed)
Occupational Therapy Treatment Patient Details Name: [name] MRN: [id number] DOB: 1966-02-11 Today's Date: 07/30/2021   History of present illness Pt is a 56 y/o F admitted on 07/21/21 after presenting to ED for c/c of pain from falling during transfer from w/c to recliner ~1 week ago & pt missed dialysis for a whole week. Pt found to ahve distal L femur fx & deemed no surgery by orthopedics but NWB LLE. PMH: HTN, HLD, ESRD on HD MWF, L BKA in 2021, depression, anxiety, insulin-dependent DM, insomnia, chronic pain syndromes   OT comments  Upon entering the room, pt supine in bed and reports feeling some better but still reports 9/10 pain in L LE. She does report having received pain medication prior to therapist arrival. Pt is agreeable to attempt EOB. She is able to move R LE off EOB and moved L residual limb with assistance for B UEs. She is sitting in long sitting position during majority of session while performing bed mobility. She sit on EOB in circle sitting position and reports some nausea. Sit for ~ 1 minute and then returns herself to bed in same manner. Pt reports nausea and requesting to rest at this time. All needs within reach and recommendation continue to be for SNF at hospital discharge before returning home.    Recommendations for follow up therapy are one component of a multi-disciplinary discharge planning process, led by the attending physician.  Recommendations may be updated based on patient status, additional functional criteria and insurance authorization.    Follow Up Recommendations  Skilled nursing-short term rehab (<3 hours/day)    Assistance Recommended at Discharge Intermittent Supervision/Assistance  Patient can return home with the following  A lot of help with walking and/or transfers;A lot of help with bathing/dressing/bathroom;Help with stairs or ramp for entrance;Assistance with cooking/housework   Equipment Recommendations  Other (comment) (defer to  next venue of care)       Precautions / Restrictions Precautions Precautions: Fall Restrictions Weight Bearing Restrictions: Yes LLE Weight Bearing: Non weight bearing Other Position/Activity Restrictions: pt also with chronic L BKA       Mobility Bed Mobility Overal bed mobility: Needs Assistance       Supine to sit: Supervision Sit to supine: Supervision   General bed mobility comments: increased time and pt moving L LE with UEs across bed. Cuing for technique and pt sitting in circle sitting position on EOB    Transfers                   General transfer comment: pt declined     Balance Overall balance assessment: Needs assistance Sitting-balance support: Bilateral upper extremity supported, Feet supported Sitting balance-Leahy Scale: Good                                     ADL either performed or assessed with clinical judgement    Extremity/Trunk Assessment Upper Extremity Assessment Upper Extremity Assessment: Generalized weakness   Lower Extremity Assessment Lower Extremity Assessment: Generalized weakness        Vision Patient Visual Report: No change from baseline            Cognition Arousal/Alertness: Awake/alert Behavior During Therapy: WFL for tasks assessed/performed Overall Cognitive Status: Within Functional Limits for tasks assessed  Pertinent Vitals/ Pain       Pain Assessment Pain Assessment: 0-10 Pain Score: 9  Pain Location: L LE Pain Descriptors / Indicators: Discomfort, Aching Pain Intervention(s): Monitored during session, Premedicated before session, Repositioned         Frequency  Min 2X/week        Progress Toward Goals  OT Goals(current goals can now be found in the care plan section)  Progress towards OT goals: Progressing toward goals  Acute Rehab OT Goals Patient Stated Goal: to get better OT Goal  Formulation: With patient Time For Goal Achievement: 08/06/21 Potential to Achieve Goals: [place] Discharge plan remains appropriate;Frequency remains appropriate       AM-PAC OT "6 Clicks" Daily Activity     Outcome Measure   Help from another person eating meals?: None Help from another person taking care of personal grooming?: A Little Help from another person toileting, which includes using toliet, bedpan, or urinal?: A Lot Help from another person bathing (including washing, rinsing, drying)?: A Lot Help from another person to put on and taking off regular upper body clothing?: A Little Help from another person to put on and taking off regular lower body clothing?: A Lot 6 Click Score: 16    End of Session    OT Visit Diagnosis: Muscle weakness (generalized) (M62.81);Unsteadiness on feet (R26.81);History of falling (Z91.81)   Activity Tolerance Patient tolerated treatment well   Patient Left in bed;with call bell/phone within reach;with bed alarm set   Nurse Communication Mobility status        Time: 3748-2707 OT Time Calculation (min): 16 min  Charges: OT General Charges $OT Visit: 1 Visit OT Treatments $Therapeutic Activity: 8-22 mins  Darleen Crocker, MS, OTR/L , CBIS ascom [phone]  07/30/21, 4:23 PM

## 2021-07-30 NOTE — TOC Progression Note (Signed)
Transition of Care [facility]) - Progression Note    Patient Details  Name: [name] MRN: [id number] Date of Birth: 04-23-1965  [facility] CM/SW [place], RN Phone Number: 07/30/2021, 10:16 AM  Clinical Narrative:    Continues to await a chair time for dialysis, to be seen by ID for possible IV ABX plan,  TOC to continue to follow Will DC to [facility] when able   Expected Discharge Plan: Skilled Nursing Facility Barriers to Discharge:  (chair time, dilaysis)  Expected Discharge Plan and Services Expected Discharge Plan: [place]   Discharge Planning Services: CM Consult   Living arrangements for the past 2 months: Single Family Home                 DME Arranged: N/A                     Social Determinants of Health (SDOH) Interventions    Readmission Risk Interventions    07/26/2021    3:15 PM  Readmission Risk Prevention Plan  Transportation Screening Complete  PCP or Specialist Appt within 3-5 Days Complete  HRI or Home Care Consult Complete  Palliative Care Screening Not Applicable  Medication Review (RN Care Manager) Referral to Pharmacy

## 2021-07-30 NOTE — Consult Note (Signed)
ORTHOPAEDIC CONSULTATION  REQUESTING PHYSICIAN: Emeterio Reeve, DO  Chief Complaint:   L hip abscess  History of Present Illness: [name] is a 56 y.o. female with history of hypertension, hyperlipidemia, end-stage renal disease on HD MWF, history of left BKA in 2021, depression, anxiety, and uncontrolled insulin-dependent diabetes mellitus who was noted to have a superficial abscess on the lateral aspect of the left hip.  This spontaneously ruptured 2 days ago with significant purulent material.  I was consulted yesterday for further evaluation.  Of note, patient underwent left hip IM nailing on 12/29/2019.  She then underwent a below-knee amputation on 01/12/2020 for chronic calcaneus osteomyelitis (after having undergone prior left toe amputation and left [place] amputation in 2014 and 2015).  Then, on 01/19/2020, she went underwent irrigation and debridement of the left hip due to proximal left hip drainage. She followed with infectious diseases until 03/09/2020 where it was noted that her incision had healed.  She finished a course of ertapenem for Enterobacter and Bacteroides.  Today, she states that she has been nonambulatory since her below-knee amputation.  She uses a wheelchair for transport.  She states she has had recurrent drainage from the lateral aspect of the left hip as she describes chronic "boils" that occur every ~1-2 months on the lateral aspect of the hip. She has no pain until boil forms. These usually burst and there is release of infectious material.  She states she has no significant pain about the hip until the boil occurs.  She was also found to have a distal femur fracture at the time of admission approximately 1 week ago.  On-call physician, Dr. Sabra Heck at [facility] was called about that fracture, and nonoperative management in a splint was recommended.  She is also currently being treated for  pneumonia and UTI.  Past Medical History:  Diagnosis Date   Anxiety 2002   Chest tightness    Depression 2001   Diabetes type 1, uncontrolled    at age 56   Diabetic neuropathy ([place])    Essential hypertension 2015   GERD (gastroesophageal reflux disease)    about age of 69   Nausea and vomiting in adult    Stroke [facility])    Urinary frequency    Yeast vaginitis    Past Surgical History:  Procedure Laterality Date   ACHILLES TENDON SURGERY Left 03/10/2013   Procedure: LEFT CHOPART AMPUTATION/ LEFT TENDON ACHILLES [place];  Surgeon: Wylene Simmer, MD;  Location: [place];  Service: Orthopedics;  Laterality: Left;   AMPUTATION Left 06/15/2012   Procedure: AMPUTATION DIGIT;  Surgeon: Meredith Pel, MD;  Location: [place];  Service: Orthopedics;  Laterality: Left;  Left great toe revision amputation   AMPUTATION Left 01/12/2020   Procedure: AMPUTATION BELOW KNEE;  Surgeon: Wylene Simmer, MD;  Location: [place];  Service: Orthopedics;  Laterality: Left;   AV FISTULA PLACEMENT Left 01/09/2020   Procedure: LEFT UPPER ARM ARTERIOVENOUS (AV) FISTULA CREATION;  Surgeon: Cherre Robins, MD;  Location: [place];  Service: Vascular;  Laterality: Left;   ENDOMETRIAL ABLATION     I & D EXTREMITY Left 01/19/2020   Procedure: IRRIGATION AND DEBRIDEMENT Left Hip;  Surgeon: Rod Can, MD;  Location: [place];  Service: Orthopedics;  Laterality: Left;   INSERTION OF DIALYSIS CATHETER Right 01/09/2020   Procedure: INSERTION OF DIALYSIS CATHETER;  Surgeon: Cherre Robins, MD;  Location: [place];  Service: Vascular;  Laterality: Right;   INTRAMEDULLARY (IM) NAIL INTERTROCHANTERIC Left 12/29/2019  Procedure: INTRAMEDULLARY (IM) NAIL INTERTROCHANTRIC;  Surgeon: Rod Can, MD;  Location: [facility];  Service: Orthopedics;  Laterality: Left;   IR ANGIOGRAM EXTREMITY LEFT  12/17/2018   IR FEM POP ART ATHERECT INC PTA MOD SED  12/17/2018   IR RADIOLOGIST EVAL & MGMT  11/30/2018   IR RADIOLOGIST EVAL & MGMT   12/09/2018   IR RADIOLOGIST EVAL & MGMT  02/08/2019   IR US GUIDE VASC ACCESS RIGHT  12/17/2018   LAPAROSCOPIC CHOLECYSTECTOMY  01/30/2015   [facility]    Social History   Socioeconomic History   Marital status: Divorced    Spouse name: Not on file   Number of children: 1   Years of education: Not on file   Highest education level: Not on file  Occupational History    Employer: [employer]  Tobacco Use   Smoking status: Light Smoker    Packs/day: 0.25    Years: 22.00    Pack years: 5.50    Types: Cigarettes   Smokeless tobacco: Never  Vaping Use   Vaping Use: Never used  Substance and Sexual Activity   Alcohol use: Yes    Alcohol/week: 0.0 standard drinks    Comment: sociial   Drug use: No   Sexual activity: Yes    Partners: Male  Other Topics Concern   Not on file  Social History Narrative   Lives at home with her daughter   Right handed   Caffeine: 3 cups daily   Social Determinants of Health   Financial Resource Strain: Not on file  Food Insecurity: Not on file  Transportation Needs: Not on file  Physical Activity: Not on file  Stress: Not on file  Social Connections: Not on file   Family History  Problem Relation Age of Onset   Diabetes Mother    Heart attack Mother    Heart disease Mother        before age 32   Hypertension Mother    Hyperlipidemia Mother    Diabetes Father    Heart attack Father    Heart disease Father        before age 45   Diabetes Sister    Hypertension Sister    Allergies  Allergen Reactions   Trazodone Swelling   Latex Rash   Lidocaine Itching   Prior to Admission medications   Medication Sig Start Date End Date Taking? Authorizing Provider  alosetron (LOTRONEX) 1 MG tablet Take 1 mg by mouth 2 (two) times daily. 12/19/19  Yes [provider]  amitriptyline (ELAVIL) 25 MG tablet Take 25 mg by mouth at bedtime. 07/02/21  Yes [provider]  amLODipine (NORVASC) 5 MG tablet Take 1 tablet (5 mg total) by  mouth daily. 02/21/20  Yes Love, Ivan Anchors, PA-C  atorvastatin (LIPITOR) 40 MG tablet TAKE 1 TABLET (40 MG TOTAL) BY MOUTH DAILY AT 6 PM. 02/22/19  Yes Fulp, Cammie, MD  Biotin 10 MG CAPS Take 1 capsule by mouth daily. 06/05/21  Yes [provider]  carvedilol (COREG) 6.25 MG tablet Take 1 tablet (6.25 mg total) by mouth 2 (two) times daily. 02/21/20  Yes Love, Ivan Anchors, PA-C  Cinnamon 500 MG capsule Take 500 mg by mouth daily.   Yes [provider]  citalopram (CELEXA) 20 MG tablet Take 1 tablet (20 mg total) by mouth daily. 02/22/20  Yes Love, Ivan Anchors, PA-C  cyclobenzaprine (FLEXERIL) 10 MG tablet Take 1 tablet by mouth 3 (three) times daily as needed. 07/28/20  Yes [provider]  D3-50 1.25 MG (50000 UT) capsule Take 50,000 Units by mouth as directed. Monday, wed, Friday 05/31/20  Yes [provider]  DULoxetine (CYMBALTA) 60 MG capsule Take 60 mg by mouth daily. 07/17/20  Yes [provider]  hydrALAZINE (APRESOLINE) 100 MG tablet Take 1 tablet (100 mg total) by mouth 3 (three) times daily. 02/21/20  Yes Love, Ivan Anchors, PA-C  insulin glargine (LANTUS) 100 UNIT/ML injection Inject 0.12 mLs (12 Units total) into the skin at bedtime. 02/21/20  Yes Love, Ivan Anchors, PA-C  iron sucrose in sodium chloride 0.9 % 100 mL Iron Sucrose (Venofer) 05/21/20  Yes [provider]  Menthol-Methyl Salicylate (MUSCLE RUB) 10-15 % CREA Apply 1 application topically as needed for muscle pain. 02/02/20  Yes Love, Ivan Anchors, PA-C  NOVOLOG MIX 70/30 FLEXPEN (70-30) 100 UNIT/ML FlexPen SMARTSIG:10 Unit(s) SUB-Q 3 Times Daily 05/27/21  Yes [provider]  sevelamer carbonate (RENVELA) 800 MG tablet Take 2 tablets (1,600 mg total) by mouth 3 (three) times daily with meals. 02/21/20  Yes Love, Ivan Anchors, PA-C  sodium chloride (OCEAN) 0.65 % SOLN nasal spray Place 1 spray into both nostrils as needed for congestion (nose irritation). 02/02/20  Yes Love, Ivan Anchors, PA-C   cyclobenzaprine (FLEXERIL) 5 MG tablet Take 1 tablet (5 mg total) by mouth 3 (three) times daily as needed for muscle spasms. 02/21/20   Love, Ivan Anchors, PA-C  naloxone [facility]) nasal spray 4 mg/0.1 mL 1 spray as directed. Patient not taking: Reported on 07/21/2021 04/27/20   [provider]  nicotine (NICODERM CQ - DOSED IN MG/24 HOURS) 21 mg/24hr patch Place 1 patch (21 mg total) onto the skin daily as needed (nicotine craving). 07/25/21   Emeterio Reeve, DO  oxyCODONE (OXY IR/ROXICODONE) 5 MG immediate release tablet Take 1-2 tablets (5-10 mg total) by mouth every 6 (six) hours as needed for severe pain. 02/21/20   Love, Ivan Anchors, PA-C  oxyCODONE-acetaminophen (PERCOCET) 7.5-325 MG tablet Take 1 tablet by mouth every 6 (six) hours as needed for moderate pain or severe pain. 07/25/21   Emeterio Reeve, DO  pantoprazole (PROTONIX) 40 MG tablet Take 1 tablet (40 mg total) by mouth daily. 07/26/21   Emeterio Reeve, DO  polyethylene glycol (MIRALAX / GLYCOLAX) 17 g packet Take 17 g by mouth daily as needed for mild constipation. 07/25/21   Emeterio Reeve, DO   Recent Labs    07/28/21 0401 07/28/21 8250 07/29/21 0420 07/30/21 0345  WBC 26.9*  --  23.8* 17.3*  HGB 6.7*  --  7.4* 7.7*  HCT 22.2*  --  23.6* 25.3*  PLT 350  --  309 299  K 4.3  --  5.0 4.0  CL 92*  --  96* 97*  CO2 26  --  24 28  BUN 74*  --  89* 42*  CREATININE 7.87*  --  9.03* 5.24*  GLUCOSE 437*   < > 314* 372*  CALCIUM 8.7*  --  8.9 8.9   < > = values in this interval not displayed.   MR HIP LEFT WO CONTRAST  Result Date: 07/29/2021 CLINICAL DATA:  Left hip septic arthritis suspected. Soft tissue infection suspected. End-stage renal disease on hemodialysis. History of left below-the-knee amputation in 2021. Insulin-dependent diabetes. Fell from transfer from wheelchair recliner about 1 week prior to admission. EXAM: MR OF THE LEFT HIP WITHOUT CONTRAST TECHNIQUE: Multiplanar, multisequence MR imaging was  performed. No intravenous contrast was administered. COMPARISON:  Pelvis and  left hip radiographs 07/29/2021, 01/19/2020, 12/28/2019; CT abdomen and pelvis 02/07/2020 FINDINGS: Bones: Postsurgical changes are again seen of left cephalomedullary nail fixation of a left proximal femoral intertrochanteric fracture seen acutely on 12/28/2019 radiographs and fixated 12/29/2019. On 07/29/2021 there is increased lucency around the femoral intertrochanteric region and femoral neck portions of the hardware suspicious for hardware loosening. There is decreased T1 and intermediate T2 signal heterogeneous material broadly contacting the posterosuperior aspect of the left proximal femoral intertrochanteric region and measuring up to approximately 6.6 x 4.2 x 6.4 cm. This material appears to extend from the junction of the superior greater trochanter in the superior tip of the long axis proximal femoral diaphyseal intramedullary rod (axial series 6 images 10 through 12). This is suspicious for complex fluid extending posteriorly from the joint space, and is suspicious for an infectious process. Within the more distal aspect of the hardware where the distal transverse screw fixates the proximal femoral diaphyseal nail, there is increased lucency on 07/29/2021 radiographs, and similar decreased T1 and intermediate T2 signal heterogeneous material extends posteriorly and laterally from the superior aspect of the proximal nail, measuring up to 3.6 x 4.0 by 3.3 cm suspicious for an additional infected pocket of fluid. There is moderate to high-grade soft tissue edema and swelling throughout the proximal lateral left thigh, greatest around these suspected infectious fluid collections. There also appears to be similar complex fluid material extending laterally from the lateral aspect of the femoral neck nail where there is surrounding lucency within the bone. This complex fluid appears to extend laterally through the proximal lateral  hip skin surface. Recommend clinical correlation for a draining sinus tract through the lateral skin surface (coronal series 3, image 11 and axial series 5, image 33). This infected region is in between the two above described more superior and inferior fluid collections respectively. Left hip: Articular cartilage and labrum Articular cartilage: Moderate superior left femoroacetabular cartilage thinning. Labrum: High-grade attenuation of the anterior superior left acetabular labrum. Joint effusion Joint effusion:  No left hip joint effusion is seen. Muscles and tendons Muscles and tendons: Diffuse edema throughout the left gluteus minimus gluteus medius, rectus femoris, and visualized proximal vastus lateralis and long head of the biceps femoris muscles. More mild edema within the right gluteus minimus greater than gluteus medius muscles. Mild fluid bright signal within the left common hamstring tendon origin, likely a small partial-thickness tear. Trace fluid along the peripheral lateral border of the left rectus abdominis muscle insertion on the pubic symphysis (axial series 5, images 23-29). Other findings Miscellaneous:   None. IMPRESSION: 1. Postsurgical changes are seen of left cephalomedullary nail fixation of a proximal left femoral intertrochanteric fracture seen acutely on 12/28/2019 radiographs. On 07/29/2021 radiographs there is increased lucency around the femoral neck screw and hardware in the intertrochanteric region as well as the distal (proximal femoral diaphyseal) interlocking screw. In these regions including the posterosuperior aspect of the greater trochanter, junction of the femoral neck and longer stem femoral diaphyseal intramedullary nail and junction of the distal interlocking screw and tip distal femoral intramedullary nail, there is complex fluid suspicious for infectious material extending laterally. There appears to be a sinus tract extending to the skin surface within the middle of  these 3 infected regions. 2. Nonspecific myositis of the surrounding left hip musculature. This also may represent infection of the muscle tissues. 3. Small partial-thickness tear of the common left hamstring tendon origin. Electronically Signed   By: Yvonne Kendall M.D.   On:  07/29/2021 18:38   DG HIP UNILAT WITH PELVIS 2-3 VIEWS LEFT  Result Date: 07/29/2021 CLINICAL DATA:  Left hip pain.  Old left hip fracture. EXAM: DG HIP (WITH OR WITHOUT PELVIS) 2-3V LEFT COMPARISON:  CT on 02/07/2020 FINDINGS: Intramedullary rod and screw are again seen transfixing an old intertrochanteric left hip fracture. No evidence of acute fracture or dislocation. Generalized osteopenia is noted. IMPRESSION: Old intertrochanteric left hip fracture. No acute findings. Electronically Signed   By: Marlaine Hind M.D.   On: 07/29/2021 16:37     Positive ROS: All other systems have been reviewed and were otherwise negative with the exception of those mentioned in the HPI and as above.  Physical Exam: BP (!) 148/48 (BP Location: Right Arm)   Pulse 80   Temp 98.5 F (36.9 C)   Resp 16   Ht 5\' 10"  (1.778 m)   Wt 84.6 kg   SpO2 100%   BMI 26.76 kg/m  General:  Alert, no acute distress Psychiatric:  Patient is competent for consent with normal mood and affect   Cardiovascular:  No pedal edema, regular rate and rhythm Respiratory:  No wheezing, non-labored breathing GI:  Abdomen is soft and non-tender Skin:  No lesions in the area of chief complaint, no erythema Neurologic:  Sensation intact distally, CN grossly intact Lymphatic:  No axillary or cervical lymphadenopathy  Orthopedic Exam:  LLE: -Distal extremity and stump currently in a splint with the knee in flexion.  The splint is clean, dry, and intact. -There is an approximately 2 cm wound on the lateral aspect of the hip with mild purulent appearing drainage from this region.  Dressing also with purulent drainage.  There is mild erythema about this region.  There  is also mild tenderness. -Hip range of motion: No significant pain with forward flexion to 80 degrees, IR, and ER of the hip   Imaging: As above: Radiographs of the left hip show loose hardware from prior hip IM nailing, likely from chronic infection.  Fracture appears to have collapsed into varus, but there are signs of healed fracture.  Radiographs of the left knee from admission showed impacted distal femur fracture.  MRI shows significant abscess formation about the implant including the proximal tip of the trochanter and distal interlocking screw with sinus tract to the lateral aspect of the hip.  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 1.  We had a lengthy discussion regarding the clinical and imaging findings.  We discussed that my recommendation would be surgery likely consisting of L hip I&D, removal of hardware, and placement of antibiotic beads. This would likely be the only way to eradicate the infection.  However, patient does not wish to proceed with surgery at this point in time, especially if she is not having significant pain.  She does understand that the wound would likely continue to drain.  She also understands that she may be at risk of osteomyelitis, bacteremia, and systemic spread.  She states that she may wish to consider this surgery in the future, but not at this time.  2.  Given patient preference, infectious diseases consultation may be of benefit to discuss possible suppression with chronic antibiotics.  3.  Continue nonweightbearing on left lower extremity for distal femur fracture  4.  Patient may follow-up with [place] as an outpatient for this and/or with Dr. Sabra Heck for  her distal femur fracture.  Please page with any questions.  We will plan to follow peripherally.  Leim Fabry   07/30/2021 8:06 AM

## 2021-07-30 NOTE — Consult Note (Signed)
NAME: [name]  DOB: 08-29-1965  MRN: [id number]  Date/Time: 07/30/2021 5:32 PM  REQUESTING PROVIDER: Dr. Sheppard Coil Subjective:  REASON FOR CONSULT: Infection of the left hip ? [name] is a 56 y.o. 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   Vitals in the ED was normal. Na 127, K 6.1, WBC 17.2, HB 9, PLT 277 Xray showed comminuted fracture lower third  Ortho was consulted and did not think surgery was needed as she was non weight bearing She continued to have pain She also started having leucocytosis and eventually noted spontaneous drainage from the left hiip previous surgery site.  There was a lot of purulence.  And MRI was done which showed 3 pockets of pus previous surgery Orthopedic surgeon saw her today.  He advised surgery to remove the hardware but patient did not want that.  So I was asked to see the patient for antibiotic recommendation. Patient states she has had drainage from the left hip site intermittently for the past year.  Initially it was clear and and as she did not have any other symptoms she did not worry about it She has not had any fever No chills or night sweats.  She has been pretty much immobile.  She has not started to use the prosthesis    Past Medical History:  Diagnosis Date   Anxiety 2002   Chest tightness    Depression 2001   Diabetes type 1, uncontrolled    at age 80   Diabetic neuropathy ([place])    Essential hypertension 2015   GERD (gastroesophageal reflux disease)    about age of 3   Nausea and vomiting in adult    Stroke [facility])    Urinary frequency    Yeast  vaginitis     Past Surgical History:  Procedure Laterality Date   ACHILLES TENDON SURGERY Left 03/10/2013   Procedure: LEFT CHOPART AMPUTATION/ LEFT TENDON ACHILLES [place];  Surgeon: Wylene Simmer, MD;  Location: [place];  Service: Orthopedics;  Laterality: Left;   AMPUTATION Left 06/15/2012   Procedure: AMPUTATION DIGIT;  Surgeon: Meredith Pel, MD;  Location: [place];  Service: Orthopedics;  Laterality: Left;  Left great toe revision amputation   AMPUTATION Left 01/12/2020   Procedure: AMPUTATION BELOW KNEE;  Surgeon: Wylene Simmer, MD;  Location: [place];  Service: Orthopedics;  Laterality: Left;   AV FISTULA PLACEMENT Left 01/09/2020   Procedure: LEFT UPPER ARM ARTERIOVENOUS (AV) FISTULA CREATION;  Surgeon: Cherre Robins, MD;  Location: [place];  Service: Vascular;  Laterality: Left;   ENDOMETRIAL ABLATION     I & D EXTREMITY Left 01/19/2020   Procedure: IRRIGATION AND DEBRIDEMENT Left Hip;  Surgeon: Rod Can, MD;  Location: [place];  Service: Orthopedics;  Laterality: Left;   INSERTION OF DIALYSIS CATHETER Right 01/09/2020   Procedure: INSERTION OF DIALYSIS CATHETER;  Surgeon: Cherre Robins, MD;  Location: [place];  Service: Vascular;  Laterality: Right;   INTRAMEDULLARY (IM) NAIL INTERTROCHANTERIC Left 12/29/2019   Procedure: INTRAMEDULLARY (IM) NAIL INTERTROCHANTRIC;  Surgeon: Rod Can, MD;  Location: [facility];  Service: Orthopedics;  Laterality: Left;  IR ANGIOGRAM EXTREMITY LEFT  12/17/2018   IR FEM POP ART ATHERECT INC PTA MOD SED  12/17/2018   IR RADIOLOGIST EVAL & MGMT  11/30/2018   IR RADIOLOGIST EVAL & MGMT  12/09/2018   IR RADIOLOGIST EVAL & MGMT  02/08/2019   IR US GUIDE VASC ACCESS RIGHT  12/17/2018   LAPAROSCOPIC CHOLECYSTECTOMY  01/30/2015   [facility]     Social History   Socioeconomic History   Marital status: Divorced    Spouse name: Not on file   Number of children: 1   Years of education: Not on file   Highest education level: Not on file   Occupational History    Employer: [employer]  Tobacco Use   Smoking status: Light Smoker    Packs/day: 0.25    Years: 22.00    Pack years: 5.50    Types: Cigarettes   Smokeless tobacco: Never  Vaping Use   Vaping Use: Never used  Substance and Sexual Activity   Alcohol use: Yes    Alcohol/week: 0.0 standard drinks    Comment: sociial   Drug use: No   Sexual activity: Yes    Partners: Male  Other Topics Concern   Not on file  Social History Narrative   Lives at home with her daughter   Right handed   Caffeine: 3 cups daily   Social Determinants of Health   Financial Resource Strain: Not on file  Food Insecurity: Not on file  Transportation Needs: Not on file  Physical Activity: Not on file  Stress: Not on file  Social Connections: Not on file  Intimate Partner Violence: Not on file    Family History  Problem Relation Age of Onset   Diabetes Mother    Heart attack Mother    Heart disease Mother        before age 75   Hypertension Mother    Hyperlipidemia Mother    Diabetes Father    Heart attack Father    Heart disease Father        before age 62   Diabetes Sister    Hypertension Sister    Allergies  Allergen Reactions   Trazodone Swelling   Latex Rash   Lidocaine Itching   I? Current Facility-Administered Medications  Medication Dose Route Frequency Provider Last Rate Last Admin   amitriptyline (ELAVIL) tablet 25 mg  25 mg Oral QHS Cox, Amy N, DO   25 mg at 07/29/21 2159   amLODipine (NORVASC) tablet 5 mg  5 mg Oral Daily Cox, Amy N, DO   5 mg at 07/29/21 1423   atorvastatin (LIPITOR) tablet 40 mg  40 mg Oral q1800 Cox, Amy N, DO   40 mg at 07/30/21 1713   azithromycin (ZITHROMAX) 500 mg in sodium chloride 0.9 % 250 mL IVPB  500 mg Intravenous Q24H Emeterio Reeve, DO 250 mL/hr at 07/30/21 1251 500 mg at 07/30/21 1251   carvedilol (COREG) tablet 6.25 mg  6.25 mg Oral BID Cox, Amy N, DO   6.25 mg at 07/30/21 0900   ceFEPIme (MAXIPIME) 1 g in sodium  chloride 0.9 % 100 mL IVPB  1 g Intravenous Q24H Dallie Piles, RPH       Chlorhexidine Gluconate Cloth 2 % PADS 6 each  6 each Topical Q0600 Colon Flattery, NP   6 each at 07/30/21 0410   citalopram (CELEXA) tablet 20 mg  20 mg Oral Daily Cox, Amy N, DO   20 mg at 07/30/21 0900  cyclobenzaprine (FLEXERIL) tablet 5 mg  5 mg Oral TID PRN Cox, Amy N, DO   5 mg at 07/28/21 1458   DULoxetine (CYMBALTA) DR capsule 60 mg  60 mg Oral Daily Cox, Amy N, DO   60 mg at 07/30/21 0900   epoetin alfa (EPOGEN) injection 10,000 Units  10,000 Units Intravenous Q M,W,F-HD Colon Flattery, NP   10,000 Units at 07/29/21 1205   HYDROmorphone (DILAUDID) injection 0.5 mg  0.5 mg Intravenous Q4H PRN Sharen Hones, MD   0.5 mg at 07/30/21 1416   insulin aspart (novoLOG) injection 0-9 Units  0-9 Units Subcutaneous TID WC Emeterio Reeve, DO   9 Units at 07/30/21 1713   insulin glargine-yfgn (SEMGLEE) injection 12 Units  12 Units Subcutaneous QHS Emeterio Reeve, DO   12 Units at 07/29/21 2159   nicotine (NICODERM CQ - dosed in mg/24 hours) patch 21 mg  21 mg Transdermal Daily PRN Cox, Amy N, DO       ondansetron (ZOFRAN) tablet 4 mg  4 mg Oral Q6H PRN Cox, Amy N, DO       Or   ondansetron (ZOFRAN) injection 4 mg  4 mg Intravenous Q6H PRN Cox, Amy N, DO   4 mg at 07/29/21 1447   oxyCODONE-acetaminophen (PERCOCET) 7.5-325 MG per tablet 1 tablet  1 tablet Oral Q4H PRN Sharen Hones, MD   1 tablet at 07/30/21 1548   pantoprazole (PROTONIX) EC tablet 40 mg  40 mg Oral Daily Sharen Hones, MD   40 mg at 07/30/21 0901   polyethylene glycol (MIRALAX / GLYCOLAX) packet 17 g  17 g Oral Daily PRN Cox, Amy N, DO       sevelamer carbonate (RENVELA) tablet 1,600 mg  1,600 mg Oral TID WC Cox, Amy N, DO   1,600 mg at 07/30/21 1713     Abtx:  Anti-infectives (From admission, onward)    Start     Dose/Rate Route Frequency Ordered Stop   07/30/21 2000  ceFEPIme (MAXIPIME) 1 g in sodium chloride 0.9 % 100 mL IVPB        1  g 200 mL/hr over 30 Minutes Intravenous Every 24 hours 07/29/21 0658     07/27/21 1400  ceFEPIme (MAXIPIME) 2 g in sodium chloride 0.9 % 100 mL IVPB  Status:  Discontinued        2 g 200 mL/hr over 30 Minutes Intravenous Every 8 hours 07/27/21 1219 07/29/21 0658   07/27/21 1315  azithromycin (ZITHROMAX) 500 mg in sodium chloride 0.9 % 250 mL IVPB        500 mg 250 mL/hr over 60 Minutes Intravenous Every 24 hours 07/27/21 1219 08/01/21 1314       REVIEW OF SYSTEMS:  Const: negative fever, negative chills, negative weight loss Eyes: negative diplopia or visual changes, negative eye pain ENT: negative coryza, negative sore throat Resp: negative cough, hemoptysis, dyspnea Cards: negative for chest pain, palpitations, lower extremity edema GU: negative for frequency, dysuria and hematuria GI: Negative for abdominal pain, diarrhea, bleeding, constipation Skin: negative for rash and pruritus Heme: negative for easy bruising and gum/nose bleeding MS: As above  Neurolo:negative for headaches, dizziness, vertigo, memory problems  Psych: negative for feelings of anxiety, depression  Endocrine: Poorly controlled diabetes allergy/Immunology-as above  Objective:  VITALS:  BP (!) 126/51 (BP Location: Right Arm)   Pulse 71   Temp 98.4 F (36.9 C)   Resp 16   Ht 5\' 10"  (1.778 m)   Wt 84.6 kg  SpO2 96%   BMI 26.76 kg/m    PHYSICAL EXAM:  General: Alert, cooperative, no distress, appears stated age.  Pale Head: Normocephalic, without obvious abnormality, atraumatic. Eyes: Conjunctivae clear, anicteric sclerae. Pupils are equal ENT Nares normal. No drainage or sinus tenderness. Lips, mucosa, and tongue normal. No Thrush Neck: Supple, symmetrical, no adenopathy, thyroid: non tender no carotid bruit and no JVD. Back: No CVA tenderness. Lungs: Clear to auscultation bilaterally. No Wheezing or Rhonchi. No rales. Heart: Regular rate and rhythm, no murmur, rub or gallop. Abdomen: Soft,  non-tender,not distended. Bowel sounds normal. No masses Extremities: Left upper thigh on the lateral aspect at the site of the surgical scar there is a granulating wound with profuse  purulent discharge.  Left BKA   skin: No rashes or lesions. Or bruising Lymph: Cervical, supraclavicular normal. Neurologic: Grossly non-focal Pertinent Labs Lab Results CBC    Component Value Date/Time   WBC 17.3 (H) 07/30/2021 0345   RBC 2.68 (L) 07/30/2021 0345   HGB 7.7 (L) 07/30/2021 0345   HGB 12.0 10/29/2018 1439   HCT 25.3 (L) 07/30/2021 0345   HCT 36.6 10/29/2018 1439   PLT 299 07/30/2021 0345   PLT 345 10/29/2018 1439   MCV 94.4 07/30/2021 0345   MCV 84 10/29/2018 1439   MCH 28.7 07/30/2021 0345   MCHC 30.4 07/30/2021 0345   RDW 17.0 (H) 07/30/2021 0345   RDW 13.4 10/29/2018 1439   LYMPHSABS 3.4 07/27/2021 0433   LYMPHSABS 2.6 10/29/2018 1439   MONOABS 2.6 (H) 07/27/2021 0433   EOSABS 0.5 07/27/2021 0433   EOSABS 0.2 10/29/2018 1439   BASOSABS 0.1 07/27/2021 0433   BASOSABS 0.1 10/29/2018 1439       Latest Ref Rng & Units 07/30/2021    3:45 AM 07/29/2021    4:20 AM 07/28/2021    8:22 AM  CMP  Glucose 70 - 99 mg/dL 372   314   443    BUN 6 - 20 mg/dL 42   89     Creatinine 0.44 - 1.00 mg/dL 5.24   9.03     Sodium 135 - 145 mmol/L 132   128     Potassium 3.5 - 5.1 mmol/L 4.0   5.0     Chloride 98 - 111 mmol/L 97   96     CO2 22 - 32 mmol/L 28   24     Calcium 8.9 - 10.3 mg/dL 8.9   8.9         Microbiology: Recent Results (from the past 240 hour(s))  Culture, blood (Routine X 2) w Reflex to ID Panel     Status: None   Collection Time: 07/22/21  2:44 PM   Specimen: Right Antecubital; Blood  Result Value Ref Range Status   Specimen Description RIGHT ANTECUBITAL  Final   Special Requests   Final    BOTTLES DRAWN AEROBIC AND ANAEROBIC Blood Culture adequate volume   Culture   Final    NO GROWTH 5 DAYS Performed at [facility], [address] [place],  [place] [address]    Report Status 07/27/2021 FINAL  Final  Culture, blood (Routine X 2) w Reflex to ID Panel     Status: None   Collection Time: 07/22/21  2:46 PM   Specimen: BLOOD RIGHT HAND  Result Value Ref Range Status   Specimen Description BLOOD RIGHT HAND  Final   Special Requests   Final    BOTTLES DRAWN AEROBIC AND ANAEROBIC Blood Culture  adequate volume   Culture   Final    NO GROWTH 5 DAYS Performed at [facility], [address] [place], [place] [address]    Report Status 07/27/2021 FINAL  Final  MRSA Next Gen by PCR, Nasal     Status: None   Collection Time: 07/27/21 12:18 PM   Specimen: Nasal Mucosa; Nasal Swab  Result Value Ref Range Status   MRSA by PCR Next Gen NOT DETECTED NOT DETECTED Final    Comment: (NOTE) The GeneXpert MRSA Assay (FDA approved for NASAL specimens only), is one component of a comprehensive MRSA colonization surveillance program. It is not intended to diagnose MRSA infection nor to guide or monitor treatment for MRSA infections. Test performance is not FDA approved in patients less than 25 years old. Performed at [facility], [address] [place], [place] [address]   Culture, blood (Routine X 2) w Reflex to ID Panel     Status: None (Preliminary result)   Collection Time: 07/27/21 12:31 PM   Specimen: BLOOD  Result Value Ref Range Status   Specimen Description BLOOD RIGHT ANTECUBITAL  Final   Special Requests   Final    BOTTLES DRAWN AEROBIC AND ANAEROBIC Blood Culture adequate volume   Culture   Final    NO GROWTH 3 DAYS Performed at [facility], [address] [place], [place] [address]    Report Status PENDING  Incomplete  Culture, blood (Routine X 2) w Reflex to ID Panel     Status: None (Preliminary result)   Collection Time: 07/27/21 12:33 PM   Specimen: BLOOD  Result Value Ref Range Status   Specimen Description BLOOD BLOOD RIGHT HAND  Final   Special Requests   Final    BOTTLES DRAWN AEROBIC AND  ANAEROBIC Blood Culture adequate volume   Culture   Final    NO GROWTH 3 DAYS Performed at [facility], [address] [place], [place] [address]    Report Status PENDING  Incomplete  Urine Culture     Status: None   Collection Time: 07/28/21  5:46 PM   Specimen: Urine, Clean Catch  Result Value Ref Range Status   Specimen Description   Final    URINE, CLEAN CATCH Performed at [facility], [address], [place], [place] [address]    Special Requests   Final    NONE Performed at [facility], [address], [place], [place] [address]    Culture   Final    NO GROWTH Performed at [facility], [place] [address] [place], [place] [address]    Report Status 07/30/2021 FINAL  Final    IMAGING RESULTS:  I have personally reviewed the films Old hip fracture- with intramedullary nail- nothing new   Post surgical changes  Increased lucency around the femoral neck screw and hardware in the intertrochanteric region Complex fluid collection extending around nail to the surface    Impression/Recommendation 56 year old female with history of end-stage renal disease on dialysis, poorly controlled diabetes mellitus, left BKA presenting after a fall   New comminuted fracture of the lower femur just above the knee on the left side.  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  There is a sinus and pockets of abscess in that area. Osteomyelitis is highly likely needs removal of the hardware for clearance of infection. Discussed  with patient in great detail regarding how antibiotic alone will not help and she will have to think of getting surgery to remove the hardware. She is not sure about it But she would like to speak with the orthopedic surgeon again I sent a culture from the purulent discharge Discontinue  cefepime start meropenem as patient has had Enterobacter which was resistant and Bacteroides in the past  Left BKA  End-stage renal disease on hemodialysis  Poorly controlled diabetes mellitus  Anemia of chronic disease.  Both end-stage renal disease and chronic infection contributing to it ? ___________________________________________________ Discussed with patient, requesting provider and orthopedic surgeon Note:  This document was prepared using Dragon voice recognition software and may include unintentional dictation errors.

## 2021-07-30 NOTE — Assessment & Plan Note (Addendum)
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   05/30 Ortho recommended I&D and hardware removal and place abx beads but pt does not want to proceed w/ surgery despite extensive discussion risk/benefit.   ID consulted 05/30  Cultures from L hip abscess obtained 05/28 remain negative.  ID switched antibiotics to meropenem based on prior culture results where she grew resistant Enterobacter and bacteroids.  Another discussion with orthopedic and they are not going to perform any procedure as risk is very more than benefit.  Patient also does not want any more procedures.  -Continue with meropenem, might need long-term suppressive antibiotics on discharge, appreciate ID help

## 2021-07-30 NOTE — Progress Notes (Signed)
Update this morning: [facility] is at capacity, Referral sent to [facility]. Waiting clinical acceptance. If this clinic is at capacity, referral will need to be sent to [facility].

## 2021-07-30 NOTE — Progress Notes (Signed)
Patient is instructed  NPO after midnight, and inform her about plan for OR on 07/30/21, patient state that she talked to MD last night and she did not agree to the plan and she wants the MD to see her first in the morning.

## 2021-07-30 NOTE — Progress Notes (Signed)
[place] Dr Posey Pronto (ortho) made nurse aware that pt has refused to have surgery and remove old hardware. Verbal order with readback for pt to resume with renal diet and cont. With d/c planning

## 2021-07-30 NOTE — Progress Notes (Signed)
[facility]  ROUNDING NOTE   Subjective:   Doing fair today Laying in the bed eating breakfast Denies any acute complaints Work-up for left hip infected hardware with orthopedics is ongoing.   Objective:  Vital signs in last 24 hours:  Temp:  [98.4 F (36.9 C)-98.9 F (37.2 C)] 98.5 F (36.9 C) (05/30 0736) Pulse Rate:  [67-89] 80 (05/30 0736) Resp:  [16-20] 16 (05/30 0736) BP: (108-156)/(46-59) 148/48 (05/30 0736) SpO2:  [95 %-100 %] 100 % (05/30 0736) Weight:  [84.6 kg] 84.6 kg (05/29 1330)  Weight change:  Filed Weights   07/26/21 1248 07/29/21 0959 07/29/21 1330  Weight: 80.7 kg 86.2 kg 84.6 kg    Intake/Output: I/O last 3 completed shifts: In: 480 [P.O.:480] Out: 1530 [Urine:30; Other:1500]   Intake/Output this shift:  Total I/O In: 440 [P.O.:440] Out: -   Physical Exam: General: NAD  Head: Normocephalic, atraumatic. Moist oral mucosal membranes  Eyes: Anicteric  Lungs:  Clear to auscultation,normal effort  Heart: Regular rate and rhythm  Abdomen:  Soft, nontender  Extremities:  No peripheral edema. Lt BKA  Neurologic: Alert, able to answer all questions  Skin: No lesions  Access: Lt AVF, good thrill    Basic Metabolic Panel: Recent Labs  Lab 07/25/21 0509 07/27/21 0433 07/28/21 0401 07/28/21 0822 07/29/21 0420 07/30/21 0345  NA 133* 132* 128*  --  128* 132*  K 3.8 4.0 4.3  --  5.0 4.0  CL 94* 94* 92*  --  96* 97*  CO2 27 28 26   --  24 28  GLUCOSE 220* 321* 437* 443* 314* 372*  BUN 59* 60* 74*  --  89* 42*  CREATININE 7.28* 6.79* 7.87*  --  9.03* 5.24*  CALCIUM 8.7* 8.8* 8.7*  --  8.9 8.9     Liver Function Tests: No results for input(s): AST, ALT, ALKPHOS, BILITOT, PROT, ALBUMIN in the last 168 hours.  No results for input(s): LIPASE, AMYLASE in the last 168 hours. No results for input(s): AMMONIA in the last 168 hours.  CBC: Recent Labs  Lab 07/25/21 0509 07/27/21 0433 07/28/21 0401 07/29/21 0420 07/30/21 0345   WBC 16.1*  16.8* 25.0*  24.1* 26.9* 23.8* 17.3*  NEUTROABS 10.5* 17.2*  --   --   --   HGB 7.6*  7.6* 7.4*  7.2* 6.7* 7.4* 7.7*  HCT 24.0*  23.8* 23.9*  23.7* 22.2* 23.6* 25.3*  MCV 92.0  92.2 94.5  93.7 95.7 93.3 94.4  PLT 287  286 351  333 350 309 299     Cardiac Enzymes: No results for input(s): CKTOTAL, CKMB, CKMBINDEX, TROPONINI in the last 168 hours.  BNP: Invalid input(s): POCBNP  CBG: Recent Labs  Lab 07/29/21 0733 07/29/21 1502 07/29/21 1645 07/29/21 2106 07/30/21 1215  GLUCAP 307* 204* 284* 331* 280*     Microbiology: Results for orders placed or performed during the hospital encounter of 07/21/21  Culture, blood (Routine X 2) w Reflex to ID Panel     Status: None   Collection Time: 07/22/21  2:44 PM   Specimen: Right Antecubital; Blood  Result Value Ref Range Status   Specimen Description RIGHT ANTECUBITAL  Final   Special Requests   Final    BOTTLES DRAWN AEROBIC AND ANAEROBIC Blood Culture adequate volume   Culture   Final    NO GROWTH 5 DAYS Performed at [facility], [address] [place], [place] [address]    Report Status 07/27/2021 FINAL  Final  Culture,  blood (Routine X 2) w Reflex to ID Panel     Status: None   Collection Time: 07/22/21  2:46 PM   Specimen: BLOOD RIGHT HAND  Result Value Ref Range Status   Specimen Description BLOOD RIGHT HAND  Final   Special Requests   Final    BOTTLES DRAWN AEROBIC AND ANAEROBIC Blood Culture adequate volume   Culture   Final    NO GROWTH 5 DAYS Performed at [facility], [address], [place], [place] [address]    Report Status 07/27/2021 FINAL  Final  MRSA Next Gen by PCR, Nasal     Status: None   Collection Time: 07/27/21 12:18 PM   Specimen: Nasal Mucosa; Nasal Swab  Result Value Ref Range Status   MRSA by PCR Next Gen NOT DETECTED NOT DETECTED Final    Comment: (NOTE) The GeneXpert MRSA Assay (FDA approved for NASAL specimens only), is one component of a  comprehensive MRSA colonization surveillance program. It is not intended to diagnose MRSA infection nor to guide or monitor treatment for MRSA infections. Test performance is not FDA approved in patients less than 68 years old. Performed at [facility], [address] [place], [place] [address]   Culture, blood (Routine X 2) w Reflex to ID Panel     Status: None (Preliminary result)   Collection Time: 07/27/21 12:31 PM   Specimen: BLOOD  Result Value Ref Range Status   Specimen Description BLOOD RIGHT ANTECUBITAL  Final   Special Requests   Final    BOTTLES DRAWN AEROBIC AND ANAEROBIC Blood Culture adequate volume   Culture   Final    NO GROWTH 3 DAYS Performed at [facility], [address] [place], [place] [address]    Report Status PENDING  Incomplete  Culture, blood (Routine X 2) w Reflex to ID Panel     Status: None (Preliminary result)   Collection Time: 07/27/21 12:33 PM   Specimen: BLOOD  Result Value Ref Range Status   Specimen Description BLOOD BLOOD RIGHT HAND  Final   Special Requests   Final    BOTTLES DRAWN AEROBIC AND ANAEROBIC Blood Culture adequate volume   Culture   Final    NO GROWTH 3 DAYS Performed at [facility], [address], [place], [place] [address]    Report Status PENDING  Incomplete  Urine Culture     Status: None   Collection Time: 07/28/21  5:46 PM   Specimen: Urine, Clean Catch  Result Value Ref Range Status   Specimen Description   Final    URINE, CLEAN CATCH Performed at [facility], [address], [place], [place] [address]    Special Requests   Final    NONE Performed at [facility], [address] [place], [place] [address]    Culture   Final    NO GROWTH Performed at [facility], [place] [address] [place], [place] [address]    Report Status 07/30/2021 FINAL  Final    Coagulation Studies: No results for input(s): LABPROT, INR in the last 72  hours.  Urinalysis: Recent Labs    07/28/21 0940  [place] 1.019  PHURINE 5.0  GLUCOSEU 50*  HGBUR NEGATIVE  BILIRUBINUR NEGATIVE  KETONESUR NEGATIVE  PROTEINUR 100*  NITRITE NEGATIVE  LEUKOCYTESUR LARGE*       Imaging: MR HIP LEFT WO CONTRAST  Result Date: 07/29/2021 CLINICAL DATA:  Left hip septic arthritis suspected. Soft tissue  infection suspected. End-stage renal disease on hemodialysis. History of left below-the-knee amputation in 2021. Insulin-dependent diabetes. Fell from transfer from wheelchair recliner about 1 week prior to admission. EXAM: MR OF THE LEFT HIP WITHOUT CONTRAST TECHNIQUE: Multiplanar, multisequence MR imaging was performed. No intravenous contrast was administered. COMPARISON:  Pelvis and left hip radiographs 07/29/2021, 01/19/2020, 12/28/2019; CT abdomen and pelvis 02/07/2020 FINDINGS: Bones: Postsurgical changes are again seen of left cephalomedullary nail fixation of a left proximal femoral intertrochanteric fracture seen acutely on 12/28/2019 radiographs and fixated 12/29/2019. On 07/29/2021 there is increased lucency around the femoral intertrochanteric region and femoral neck portions of the hardware suspicious for hardware loosening. There is decreased T1 and intermediate T2 signal heterogeneous material broadly contacting the posterosuperior aspect of the left proximal femoral intertrochanteric region and measuring up to approximately 6.6 x 4.2 x 6.4 cm. This material appears to extend from the junction of the superior greater trochanter in the superior tip of the long axis proximal femoral diaphyseal intramedullary rod (axial series 6 images 10 through 12). This is suspicious for complex fluid extending posteriorly from the joint space, and is suspicious for an infectious process. Within the more distal aspect of the hardware where the distal transverse screw fixates the proximal femoral diaphyseal nail, there is increased lucency on  07/29/2021 radiographs, and similar decreased T1 and intermediate T2 signal heterogeneous material extends posteriorly and laterally from the superior aspect of the proximal nail, measuring up to 3.6 x 4.0 by 3.3 cm suspicious for an additional infected pocket of fluid. There is moderate to high-grade soft tissue edema and swelling throughout the proximal lateral left thigh, greatest around these suspected infectious fluid collections. There also appears to be similar complex fluid material extending laterally from the lateral aspect of the femoral neck nail where there is surrounding lucency within the bone. This complex fluid appears to extend laterally through the proximal lateral hip skin surface. Recommend clinical correlation for a draining sinus tract through the lateral skin surface (coronal series 3, image 11 and axial series 5, image 33). This infected region is in between the two above described more superior and inferior fluid collections respectively. Left hip: Articular cartilage and labrum Articular cartilage: Moderate superior left femoroacetabular cartilage thinning. Labrum: High-grade attenuation of the anterior superior left acetabular labrum. Joint effusion Joint effusion:  No left hip joint effusion is seen. Muscles and tendons Muscles and tendons: Diffuse edema throughout the left gluteus minimus gluteus medius, rectus femoris, and visualized proximal vastus lateralis and long head of the biceps femoris muscles. More mild edema within the right gluteus minimus greater than gluteus medius muscles. Mild fluid bright signal within the left common hamstring tendon origin, likely a small partial-thickness tear. Trace fluid along the peripheral lateral border of the left rectus abdominis muscle insertion on the pubic symphysis (axial series 5, images 23-29). Other findings Miscellaneous:   None. IMPRESSION: 1. Postsurgical changes are seen of left cephalomedullary nail fixation of a proximal left  femoral intertrochanteric fracture seen acutely on 12/28/2019 radiographs. On 07/29/2021 radiographs there is increased lucency around the femoral neck screw and hardware in the intertrochanteric region as well as the distal (proximal femoral diaphyseal) interlocking screw. In these regions including the posterosuperior aspect of the greater trochanter, junction of the femoral neck and longer stem femoral diaphyseal intramedullary nail and junction of the distal interlocking screw and tip distal femoral intramedullary nail, there is complex fluid suspicious for infectious material extending laterally. There appears to be a sinus tract extending to  the skin surface within the middle of these 3 infected regions. 2. Nonspecific myositis of the surrounding left hip musculature. This also may represent infection of the muscle tissues. 3. Small partial-thickness tear of the common left hamstring tendon origin. Electronically Signed   By: Yvonne Kendall M.D.   On: 07/29/2021 18:38   DG HIP UNILAT WITH PELVIS 2-3 VIEWS LEFT  Result Date: 07/29/2021 CLINICAL DATA:  Left hip pain.  Old left hip fracture. EXAM: DG HIP (WITH OR WITHOUT PELVIS) 2-3V LEFT COMPARISON:  CT on 02/07/2020 FINDINGS: Intramedullary rod and screw are again seen transfixing an old intertrochanteric left hip fracture. No evidence of acute fracture or dislocation. Generalized osteopenia is noted. IMPRESSION: Old intertrochanteric left hip fracture. No acute findings. Electronically Signed   By: Marlaine Hind M.D.   On: 07/29/2021 16:37     Medications:    azithromycin 500 mg (07/30/21 1251)   ceFEPime (MAXIPIME) IV      amitriptyline  25 mg Oral QHS   amLODipine  5 mg Oral Daily   atorvastatin  40 mg Oral q1800   carvedilol  6.25 mg Oral BID   Chlorhexidine Gluconate Cloth  6 each Topical Q0600   citalopram  20 mg Oral Daily   DULoxetine  60 mg Oral Daily   epoetin (EPOGEN/PROCRIT) injection  10,000 Units Intravenous Q M,W,F-HD   insulin  aspart  0-9 Units Subcutaneous TID WC   insulin glargine-yfgn  12 Units Subcutaneous QHS   pantoprazole  40 mg Oral Daily   sevelamer carbonate  1,600 mg Oral TID WC   cyclobenzaprine, HYDROmorphone (DILAUDID) injection, nicotine, ondansetron **OR** ondansetron (ZOFRAN) IV, oxyCODONE-acetaminophen, polyethylene glycol  Assessment/ Plan:  [name] is a 56 y.o.  female with past medical history including hyperlipidemia, hypertension, left BKA, diabetes, chronic pain syndrome, and end-stage renal disease on hemodialysis, who was admitted to [facility] on 07/21/2021 for Acidosis [E87.20] Hyperkalemia [E87.5] Elevated BUN [R79.9] ESRD (end stage renal disease) on dialysis ([place]) [N18.6, Z99.2] Femur fracture, left (HCC) [S72.92XA] Closed fracture of left femur, unspecified fracture morphology, unspecified portion of femur, initial encounter ([place]) [S72.92XA] Closed fracture of left distal femur ([place]) [S72.402A]  [facility]/MWF/left aVF/83 kg  End-stage renal disease on dialysis.  Will maintain outpatient schedule if possible.    -Next hemodialysis planned for Wednesday Dialysis coordinator currently seeking outpatient dialysis clinic due to patient's rehab needs.  Search remains in progress  2. Anemia of chronic kidney disease Normocytic Lab Results  Component Value Date   HGB 7.7 (L) 07/30/2021   Hemoglobin below desired target. Continue EPO with dialysis.   3. Secondary Hyperparathyroidism:  Lab Results  Component Value Date   PTH 44 01/11/2020   PTH Comment 01/11/2020   CALCIUM 8.9 07/30/2021   CAION 1.10 (L) 04/07/2017   PHOS 5.1 (H) 02/20/2020   Bone minerals remain within acceptable range.  Continue sevelamer ordered with meals  4.  Hypertension with chronic kidney disease.  Home regimen includes amlodipine, carvedilol and hydralazine.  Currently receiving these medications.  Monitor blood pressure closely.  5. Diabetes mellitus type II with chronic kidney  disease: insulin dependent. Home regimen includes to send NovoLog 70/30. Most recent hemoglobin A1c is 7.1%  on 07/22/21.          -Management as per hospitalist team 6.  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 -Evaluated by orthopedics -Currently on azithromycin and cefepime for healthcare associated pneumonia -Chronic antibiotic  suppression recommended by orthopedics.   LOS: [place] 5/30/202312:59 PM

## 2021-07-30 NOTE — Progress Notes (Signed)
PROGRESS NOTE    [name]  ZJI:[id number] DOB: 1965-09-08  DOA: 07/21/2021 Date of Service: 07/30/21 PCP: Julian Hy, PA-C     Brief Narrative / Hospital Course:  [name] is a 56 year old female with history of hypertension, hyperlipidemia, end-stage renal disease on HD MWF, history of left BKA in 2021, depression, anxiety, insulin-dependent diabetes mellitus, insomnia, chronic pain syndrome, who presents emergency department for chief concerns of pain from falling during transfer from wheelchair to recliner about 1 week prior to admission. She missed dialysis for whole week.  Upon arriving the hospital 07/21/21, she was found to have a distal left femur fracture, deemed no surgery by orthopedics.  But no weightbearing.  Patient is also restarted on dialysis. Was waiting on patient to decide on SNF facility for rehab since she was initially advised inpatient rehab but later stated she did not think she would be able to participate in intensive therapy. Pending dialysis spot but SNF placement was confirmed. Discharge was not considered however as WBC had been trending up, workup showed multifocal pneumonia on CXR despite no significant symptoms, started abx 07/27/21, obtained UA for completeness (still makes some urine and this was obtained 05/28 and abnormal), Urine Cx, sputum Cx, blood Cx. Also 05/28 drained superficial abscess L hip and sent for culture, d/w ortho 05/29 and obtaining XR and MRI to eval for L hip septic arthritis. 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 05/30 Ortho recommended I&D and hardware removal and place abx beads but pt does not want to proceed w/ surgery despite extensive discussion risk/benefit. ID consulted.    Consultants:  Nephrology Orthopedics  Procedures: Dialysis per routine     Subjective: Seen in dialysis suite today, no concerns. Denies  CP/SOB. Feeling tired.      ASSESSMENT & PLAN:   Principal Problem:   Femur fracture, left (HCC) Active Problems:   TOBACCO DEPENDENCE   Anxiety and depression   Insomnia   Type 2 diabetes mellitus with renal complication (HCC)   Obesity (BMI 30.0-34.9)   Hyponatremia   ESRD on dialysis ([place])   Leukocytosis   Essential hypertension   History of CVA (cerebrovascular accident) without residual deficits   Hyperkalemia   Metabolic acidemia   Closed fracture of left distal femur ([place])   Uncontrolled type 2 diabetes mellitus with hypoglycemia, with long-term current use of insulin ([place])   HCAP (healthcare-associated pneumonia)   Skin abscess L hip   UTI (urinary tract infection)   Septic arthritis of hip (HCC) Essential hypertension, benign - Patient takes amlodipine 5 mg daily, carvedilol 6.25 mg p.o. twice daily, these have been resumed for 07/22/2021 due to low normotensive in setting of requiring hemodialysis - Hydralazine 100 mg 3 times daily have not been resumed due to the same  Femur fracture, left [facility]) Orthopedic service recommends no surgery  Orthopedic service recommends brace in flexion of the left knee, nonweightbearing Patient to follow-up outpatient with orthopedic provider in 1 week after discharge rehab/SNF placement   Hyperkalemia Resolved Monitor bmp  Essential hypertension Amlodipine 5 mg daily, carvedilol 6.25 mg p.o. twice daily Hydralazine 5 mg IV every 6 hours.  For SBP greater than 180, 3 doses ordered  Anxiety and depression Amitriptyline 25 mg nightly, Celexa 20 mg daily, duloxetine 60 mg daily  TOBACCO DEPENDENCE - Nicotine patch as needed for nicotine craving ordered  ESRD on dialysis [facility]) Nephrology following  Leukocytosis CXR (+) multifocal pneumonia,  UA abnormal, also w/u for septic arthritis   HCAP (healthcare-associated pneumonia) WBC trending up, w/u reveals pneumonia on CXR despite no significant symptoms though pt does have  smoker's cough, no SOB. Poorly controlled diabetes and pt's lack of effort w/ mobilization/inceptive spirometry likely contributing.  Starting cefepime, azithromycin (07/29/21 is day 3 of abx for HCAP) Makes some urine will screen strep (neg) and legionella (pending) Ag MRSA screen negative Following CBC   Type 2 diabetes mellitus with renal complication (HCC) Increased SSI and basal insulin today   Skin abscess L hip See photo Drained copious purulent fluid, sent for wound culture  Awaiting XR and MRI to eval for septic arthritis  UTI (urinary tract infection) UA able to be collected 07/28/21 Already on cefepime for HCAP Urine culture pending   Septic arthritis of hip ([place]) Superficial abscess described as recurrent since previous surgery, drained at bedside 05/28 and obtained XR and MRI - concerning for 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  05/30 Ortho recommended I&D and hardware removal and place abx beads but pt does not want to proceed w/ surgery despite extensive discussion risk/benefit.  ID consulted 05/30 Cultures from L hip abscess obtained 05/28 pending I also discussed w/ pt re: surgery recommendations. She's frustrated "where are all these infections coming from" discussed need for good control of DM2 and ESRD, vascular disease, etc. Pt will think about surgery. Appreciate ID recs but pt is advised that even long term abx are unlikely to benefit this hip        DVT prophylaxis: heparin HELD d/t anemia Code Status: FULL Family Communication: daughter at bedside Disposition Plan / TOC needs: need HD spot  Barriers to discharge / significant pending items: SNF placement arranged, needs HD spot, now on IV abx for HCAP, UTI, eval for septic L hip and pt refusing surgery for L hip - will still work on getting HD spot outpatient but discharge date unknown               Objective: Vitals:    07/29/21 2023 07/30/21 0358 07/30/21 0736 07/30/21 1519  BP: (!) 124/46 (!) 125/59 (!) 148/48 (!) 126/51  Pulse: 72 67 80 71  Resp: 20 20 16 16   Temp: 98.5 F (36.9 C) 98.6 F (37 C) 98.5 F (36.9 C) 98.4 F (36.9 C)  TempSrc:      SpO2: 95% 98% 100% 96%  Weight:      Height:        Intake/Output Summary (Last 24 hours) at 07/30/2021 1628 Last data filed at 07/30/2021 1247 Gross per 24 hour  Intake 680 ml  Output --  Net 680 ml   Filed Weights   07/26/21 1248 07/29/21 0959 07/29/21 1330  Weight: 80.7 kg 86.2 kg 84.6 kg    Examination:  Constitutional:  VS as above General Appearance: alert, well-developed, well-nourished, NAD Eyes: Normal lids and conjunctive, non-icteric sclera Ears, Nose, Mouth, Throat: Normal appearance MMM Neck: No masses, trachea midline Respiratory: Normal respiratory effort Cardiovascular: No lower extremity edema on RLE Neurological: No cranial nerve deficit on limited exam Psychiatric: Normal judgment/insight Normal mood and affect       Scheduled Medications:   amitriptyline  25 mg Oral QHS   amLODipine  5 mg Oral Daily   atorvastatin  40 mg Oral q1800   carvedilol  6.25 mg Oral BID   Chlorhexidine Gluconate Cloth  6 each Topical Q0600   citalopram  20  mg Oral Daily   DULoxetine  60 mg Oral Daily   epoetin (EPOGEN/PROCRIT) injection  10,000 Units Intravenous Q M,W,F-HD   insulin aspart  0-9 Units Subcutaneous TID WC   insulin glargine-yfgn  12 Units Subcutaneous QHS   pantoprazole  40 mg Oral Daily   sevelamer carbonate  1,600 mg Oral TID WC    Continuous Infusions:  azithromycin 500 mg (07/30/21 1251)   ceFEPime (MAXIPIME) IV      PRN Medications:  cyclobenzaprine, HYDROmorphone (DILAUDID) injection, nicotine, ondansetron **OR** ondansetron (ZOFRAN) IV, oxyCODONE-acetaminophen, polyethylene glycol  Antimicrobials:  Anti-infectives (From admission, onward)    Start     Dose/Rate Route Frequency Ordered Stop    07/30/21 2000  ceFEPIme (MAXIPIME) 1 g in sodium chloride 0.9 % 100 mL IVPB        1 g 200 mL/hr over 30 Minutes Intravenous Every 24 hours 07/29/21 0658     07/27/21 1400  ceFEPIme (MAXIPIME) 2 g in sodium chloride 0.9 % 100 mL IVPB  Status:  Discontinued        2 g 200 mL/hr over 30 Minutes Intravenous Every 8 hours 07/27/21 1219 07/29/21 0658   07/27/21 1315  azithromycin (ZITHROMAX) 500 mg in sodium chloride 0.9 % 250 mL IVPB        500 mg 250 mL/hr over 60 Minutes Intravenous Every 24 hours 07/27/21 1219 08/01/21 1314       Data Reviewed: I have personally reviewed following labs and imaging studies  CBC: Recent Labs  Lab 07/25/21 0509 07/27/21 0433 07/28/21 0401 07/29/21 0420 07/30/21 0345  WBC 16.1*  16.8* 25.0*  24.1* 26.9* 23.8* 17.3*  NEUTROABS 10.5* 17.2*  --   --   --   HGB 7.6*  7.6* 7.4*  7.2* 6.7* 7.4* 7.7*  HCT 24.0*  23.8* 23.9*  23.7* 22.2* 23.6* 25.3*  MCV 92.0  92.2 94.5  93.7 95.7 93.3 94.4  PLT 287  286 351  333 350 309 536   Basic Metabolic Panel: Recent Labs  Lab 07/25/21 0509 07/27/21 0433 07/28/21 0401 07/28/21 0822 07/29/21 0420 07/30/21 0345  NA 133* 132* 128*  --  128* 132*  K 3.8 4.0 4.3  --  5.0 4.0  CL 94* 94* 92*  --  96* 97*  CO2 27 28 26   --  24 28  GLUCOSE 220* 321* 437* 443* 314* 372*  BUN 59* 60* 74*  --  89* 42*  CREATININE 7.28* 6.79* 7.87*  --  9.03* 5.24*  CALCIUM 8.7* 8.8* 8.7*  --  8.9 8.9   GFR: Estimated Creatinine Clearance: 14.5 mL/min (A) (by C-G formula based on SCr of 5.24 mg/dL (H)). Liver Function Tests: No results for input(s): AST, ALT, ALKPHOS, BILITOT, PROT, ALBUMIN in the last 168 hours.  No results for input(s): LIPASE, AMYLASE in the last 168 hours. No results for input(s): AMMONIA in the last 168 hours. Coagulation Profile: No results for input(s): INR, PROTIME in the last 168 hours. Cardiac Enzymes: No results for input(s): CKTOTAL, CKMB, CKMBINDEX, TROPONINI in the last 168 hours. BNP  (last 3 results) No results for input(s): PROBNP in the last 8760 hours. HbA1C: No results for input(s): HGBA1C in the last 72 hours.  CBG: Recent Labs  Lab 07/29/21 0733 07/29/21 1502 07/29/21 1645 07/29/21 2106 07/30/21 1215  GLUCAP 307* 204* 284* 331* 280*   Lipid Profile: No results for input(s): CHOL, HDL, LDLCALC, TRIG, CHOLHDL, LDLDIRECT in the last 72 hours. Thyroid Function Tests: No results for input(s):  TSH, T4TOTAL, FREET4, T3FREE, THYROIDAB in the last 72 hours. Anemia Panel: No results for input(s): VITAMINB12, FOLATE, FERRITIN, TIBC, IRON, RETICCTPCT in the last 72 hours. Urine analysis:    Component Value Date/Time   COLORURINE AMBER (A) 07/28/2021 0940   APPEARANCEUR CLOUDY (A) 07/28/2021 0940   LABSPEC 1.019 07/28/2021 0940   PHURINE 5.0 07/28/2021 0940   GLUCOSEU 50 (A) 07/28/2021 0940   GLUCOSEU NEGATIVE 03/29/2014 1259   HGBUR NEGATIVE 07/28/2021 0940   BILIRUBINUR NEGATIVE 07/28/2021 0940   BILIRUBINUR neg 02/16/2017 1501   KETONESUR NEGATIVE 07/28/2021 0940   PROTEINUR 100 (A) 07/28/2021 0940   UROBILINOGEN 0.2 02/16/2017 1501   UROBILINOGEN 0.2 03/29/2014 1259   NITRITE NEGATIVE 07/28/2021 0940   LEUKOCYTESUR LARGE (A) 07/28/2021 0940   Sepsis Labs: @LABRCNTIP (procalcitonin:4,lacticidven:4)  Recent Results (from the past 240 hour(s))  Culture, blood (Routine X 2) w Reflex to ID Panel     Status: None   Collection Time: 07/22/21  2:44 PM   Specimen: Right Antecubital; Blood  Result Value Ref Range Status   Specimen Description RIGHT ANTECUBITAL  Final   Special Requests   Final    BOTTLES DRAWN AEROBIC AND ANAEROBIC Blood Culture adequate volume   Culture   Final    NO GROWTH 5 DAYS Performed at [facility], [address] [place], [place] [address]    Report Status 07/27/2021 FINAL  Final  Culture, blood (Routine X 2) w Reflex to ID Panel     Status: None   Collection Time: 07/22/21  2:46 PM   Specimen: BLOOD RIGHT HAND   Result Value Ref Range Status   Specimen Description BLOOD RIGHT HAND  Final   Special Requests   Final    BOTTLES DRAWN AEROBIC AND ANAEROBIC Blood Culture adequate volume   Culture   Final    NO GROWTH 5 DAYS Performed at [facility], [address], [place], [place] [address]    Report Status 07/27/2021 FINAL  Final  MRSA Next Gen by PCR, Nasal     Status: None   Collection Time: 07/27/21 12:18 PM   Specimen: Nasal Mucosa; Nasal Swab  Result Value Ref Range Status   MRSA by PCR Next Gen NOT DETECTED NOT DETECTED Final    Comment: (NOTE) The GeneXpert MRSA Assay (FDA approved for NASAL specimens only), is one component of a comprehensive MRSA colonization surveillance program. It is not intended to diagnose MRSA infection nor to guide or monitor treatment for MRSA infections. Test performance is not FDA approved in patients less than 50 years old. Performed at [facility], [address] [place], [place] [address]   Culture, blood (Routine X 2) w Reflex to ID Panel     Status: None (Preliminary result)   Collection Time: 07/27/21 12:31 PM   Specimen: BLOOD  Result Value Ref Range Status   Specimen Description BLOOD RIGHT ANTECUBITAL  Final   Special Requests   Final    BOTTLES DRAWN AEROBIC AND ANAEROBIC Blood Culture adequate volume   Culture   Final    NO GROWTH 3 DAYS Performed at [facility], [address] [place],  [address]    Report Status PENDING  Incomplete  Culture, blood (Routine X 2) w Reflex to ID Panel     Status: None (Preliminary result)   Collection Time: 07/27/21 12:33 PM   Specimen: BLOOD  Result Value Ref Range Status   Specimen Description BLOOD BLOOD RIGHT HAND  Final   Special  Requests   Final    BOTTLES DRAWN AEROBIC AND ANAEROBIC Blood Culture adequate volume   Culture   Final    NO GROWTH 3 DAYS Performed at [facility], [address] [place], [place] [address]    Report Status PENDING   Incomplete  Urine Culture     Status: None   Collection Time: 07/28/21  5:46 PM   Specimen: Urine, Clean Catch  Result Value Ref Range Status   Specimen Description   Final    URINE, CLEAN CATCH Performed at [facility], [address] [place], [place] [address]    Special Requests   Final    NONE Performed at [facility], [address] [place], [place] [address]    Culture   Final    NO GROWTH Performed at [facility], [place] [address], [place], [place] [address]    Report Status 07/30/2021 FINAL  Final         Radiology Studies last 96 hours: MR HIP LEFT WO CONTRAST  Result Date: 07/29/2021 CLINICAL DATA:  Left hip septic arthritis suspected. Soft tissue infection suspected. End-stage renal disease on hemodialysis. History of left below-the-knee amputation in 2021. Insulin-dependent diabetes. Fell from transfer from wheelchair recliner about 1 week prior to admission. EXAM: MR OF THE LEFT HIP WITHOUT CONTRAST TECHNIQUE: Multiplanar, multisequence MR imaging was performed. No intravenous contrast was administered. COMPARISON:  Pelvis and left hip radiographs 07/29/2021, 01/19/2020, 12/28/2019; CT abdomen and pelvis 02/07/2020 FINDINGS: Bones: Postsurgical changes are again seen of left cephalomedullary nail fixation of a left proximal femoral intertrochanteric fracture seen acutely on 12/28/2019 radiographs and fixated 12/29/2019. On 07/29/2021 there is increased lucency around the femoral intertrochanteric region and femoral neck portions of the hardware suspicious for hardware loosening. There is decreased T1 and intermediate T2 signal heterogeneous material broadly contacting the posterosuperior aspect of the left proximal femoral intertrochanteric region and measuring up to approximately 6.6 x 4.2 x 6.4 cm. This material appears to extend from the junction of the superior greater trochanter in the superior tip of the long axis proximal femoral diaphyseal  intramedullary rod (axial series 6 images 10 through 12). This is suspicious for complex fluid extending posteriorly from the joint space, and is suspicious for an infectious process. Within the more distal aspect of the hardware where the distal transverse screw fixates the proximal femoral diaphyseal nail, there is increased lucency on 07/29/2021 radiographs, and similar decreased T1 and intermediate T2 signal heterogeneous material extends posteriorly and laterally from the superior aspect of the proximal nail, measuring up to 3.6 x 4.0 by 3.3 cm suspicious for an additional infected pocket of fluid. There is moderate to high-grade soft tissue edema and swelling throughout the proximal lateral left thigh, greatest around these suspected infectious fluid collections. There also appears to be similar complex fluid material extending laterally from the lateral aspect of the femoral neck nail where there is surrounding lucency within the bone. This complex fluid appears to extend laterally through the proximal lateral hip skin surface. Recommend clinical correlation for a draining sinus tract through the lateral skin surface (coronal series 3, image 11 and axial series 5, image 33). This infected region is in between the two above described more superior and inferior fluid collections respectively. Left hip: Articular cartilage and labrum Articular cartilage: Moderate superior left femoroacetabular cartilage thinning. Labrum: High-grade attenuation of the anterior superior left acetabular labrum. Joint effusion Joint effusion:  No left hip joint effusion is seen. Muscles  and tendons Muscles and tendons: Diffuse edema throughout the left gluteus minimus gluteus medius, rectus femoris, and visualized proximal vastus lateralis and long head of the biceps femoris muscles. More mild edema within the right gluteus minimus greater than gluteus medius muscles. Mild fluid bright signal within the left common hamstring tendon  origin, likely a small partial-thickness tear. Trace fluid along the peripheral lateral border of the left rectus abdominis muscle insertion on the pubic symphysis (axial series 5, images 23-29). Other findings Miscellaneous:   None. IMPRESSION: 1. Postsurgical changes are seen of left cephalomedullary nail fixation of a proximal left femoral intertrochanteric fracture seen acutely on 12/28/2019 radiographs. On 07/29/2021 radiographs there is increased lucency around the femoral neck screw and hardware in the intertrochanteric region as well as the distal (proximal femoral diaphyseal) interlocking screw. In these regions including the posterosuperior aspect of the greater trochanter, junction of the femoral neck and longer stem femoral diaphyseal intramedullary nail and junction of the distal interlocking screw and tip distal femoral intramedullary nail, there is complex fluid suspicious for infectious material extending laterally. There appears to be a sinus tract extending to the skin surface within the middle of these 3 infected regions. 2. Nonspecific myositis of the surrounding left hip musculature. This also may represent infection of the muscle tissues. 3. Small partial-thickness tear of the common left hamstring tendon origin. Electronically Signed   By: Yvonne Kendall M.D.   On: 07/29/2021 18:38   DG Chest Port 1 View  Result Date: 07/27/2021 CLINICAL DATA:  Difficulty breathing.  No cough.  Leukocytosis. EXAM: PORTABLE CHEST 1 VIEW COMPARISON:  07/21/2021. FINDINGS: Cardiac silhouette is normal in size. No mediastinal or hilar masses. Subtle airspace opacity noted above both hila, greater on the left. Remainder of the lungs is clear. No pleural effusion or pneumothorax. Skeletal structures are grossly intact. IMPRESSION: 1. Subtle airspace opacities extending from the superior hila, left greater than right suspected to be multifocal pneumonia. Electronically Signed   By: Lajean Manes M.D.   On:  07/27/2021 10:34   DG HIP UNILAT WITH PELVIS 2-3 VIEWS LEFT  Result Date: 07/29/2021 CLINICAL DATA:  Left hip pain.  Old left hip fracture. EXAM: DG HIP (WITH OR WITHOUT PELVIS) 2-3V LEFT COMPARISON:  CT on 02/07/2020 FINDINGS: Intramedullary rod and screw are again seen transfixing an old intertrochanteric left hip fracture. No evidence of acute fracture or dislocation. Generalized osteopenia is noted. IMPRESSION: Old intertrochanteric left hip fracture. No acute findings. Electronically Signed   By: Marlaine Hind M.D.   On: 07/29/2021 16:37            LOS: 8 days     Emeterio Reeve, DO [facility] 07/30/2021, 4:28 PM   Staff may message me via secure chat in [place]  but this may not receive immediate response,  please page for urgent matters!  If 7PM-7AM, please contact night-coverage [email or web address]  Dictation software was used to generate the above note. Typos may occur and escape review, as with typed/written notes. Please contact Dr Sheppard Coil directly for clarity if needed.

## 2021-07-30 NOTE — Progress Notes (Signed)
Physical Therapy Treatment Patient Details Name: [name] MRN: [id number] DOB: October 04, 1965 Today's Date: 07/30/2021   History of Present Illness Pt is a 56 y/o F admitted on 07/21/21 after presenting to ED for c/c of pain from falling during transfer from w/c to recliner ~1 week ago & pt missed dialysis for a whole week. Pt found to ahve distal L femur fx & deemed no surgery by orthopedics but NWB LLE. PMH: HTN, HLD, ESRD on HD MWF, L BKA in 2021, depression, anxiety, insulin-dependent DM, insomnia, chronic pain syndromes    PT Comments    Pt in bed upon entry agreeable to session. Still using bedpan or purewick for toileting needs due to falls anxiety. Pt is waiting for pain meds for Left thigh due to 10/10 pain but is able to partake in RLE exercises. Pt given manual or theraband resistance. Pt demonstrated some asterixis throughout session today, she reports this has been an issue throughout day, and a new one to her experience. Attempt to EOB deferred due to pt still waiting on pain meds. Pt calls for meds again at end of session.     Recommendations for follow up therapy are one component of a multi-disciplinary discharge planning process, led by the attending physician.  Recommendations may be updated based on patient status, additional functional criteria and insurance authorization.  Follow Up Recommendations  Skilled nursing-short term rehab (<3 hours/day)     Assistance Recommended at Discharge Frequent or constant Supervision/Assistance  Patient can return home with the following A lot of help with walking and/or transfers;A lot of help with bathing/dressing/bathroom;Assistance with cooking/housework;Direct supervision/assist for medications management;Direct supervision/assist for financial management;Assist for transportation;Help with stairs or ramp for entrance   Equipment Recommendations  None recommended by PT    Recommendations for Other Services       Precautions  / Restrictions Precautions Precautions: Fall Restrictions LLE Weight Bearing: Non weight bearing     Mobility  Bed Mobility Overal bed mobility: Needs Assistance (maxA for scoot up in bed due to weakness/high pain levels.)                  Transfers                        Ambulation/Gait                   Stairs             Wheelchair Mobility    Modified Rankin (Stroke Patients Only)       Balance                                            Cognition Arousal/Alertness: Awake/alert Behavior During Therapy: WFL for tasks assessed/performed Overall Cognitive Status: Within Functional Limits for tasks assessed                                          Exercises General Exercises - Upper Extremity Shoulder Extension: Strengthening, Both, 5 reps, Seated, Limitations Shoulder Extension Limitations: seated in bed, pullin gself into trunk flexion c Bilat bed rails- very weak, limited trunk excursion, mostly exemplifying increase thoracic kyphosis. General Exercises - Lower Extremity Gluteal Sets: AROM, Right, 15 reps, Supine Heel Slides: AAROM,  Right, 15 reps Hip ABduction/ADduction: Strengthening, Right, 15 reps, Limitations Hip Abduction/Adduction Limitations: manually resisted ABDCT Hip Flexion/Marching: AAROM, Right, 15 reps Toe Raises: Strengthening, 15 reps, Limitations Toe Raises Limitations: red TB (latex free) loop Heel Raises: Strengthening, Right, 15 reps, Supine Mini-Sqauts: Strengthening, Right, 15 reps, Supine    General Comments        Pertinent Vitals/Pain Pain Assessment Pain Assessment: 0-10 Pain Score: 10-Worst pain ever Faces Pain Scale:  (left distal thigh)    Home Living                          Prior Function            PT Goals (current goals can now be found in the care plan section) Acute Rehab PT Goals Patient Stated Goal: less pain with movement PT Goal  Formulation: With patient Time For Goal Achievement: 08/05/21 Potential to Achieve Goals: Good Progress towards PT goals: Not progressing toward goals - comment    Frequency    7X/week      PT Plan Current plan remains appropriate    Co-evaluation              AM-PAC PT "6 Clicks" Mobility   Outcome Measure  Help needed turning from your back to your side while in a flat bed without using bedrails?: A Lot Help needed moving from lying on your back to sitting on the side of a flat bed without using bedrails?: A Lot Help needed moving to and from a bed to a chair (including a wheelchair)?: A Lot Help needed standing up from a chair using your arms (e.g., wheelchair or bedside chair)?: A Lot Help needed to walk in hospital room?: Total Help needed climbing 3-5 steps with a railing? : Total 6 Click Score: 10    End of Session   Activity Tolerance: Patient limited by pain;Patient tolerated treatment well Patient left: in bed;with call bell/phone within reach;with bed alarm set Nurse Communication: Mobility status PT Visit Diagnosis: Difficulty in walking, not elsewhere classified (R26.2);Other abnormalities of gait and mobility (R26.89);Muscle weakness (generalized) (M62.81);Other (comment);Pain Pain - Right/Left: Left Pain - part of body: Leg     Time: 1327-1350 PT Time Calculation (min) (ACUTE ONLY): 23 min  Charges:  $Therapeutic Exercise: 23-37 mins                    2:08 PM, 07/30/21 Etta Grandchild, PT, DPT Physical Therapist - [facility]  [phone] ([place])    Garvin C 07/30/2021, 2:03 PM

## 2021-07-31 DIAGNOSIS — J189 Pneumonia, unspecified organism: Secondary | ICD-10-CM | POA: Diagnosis not present

## 2021-07-31 DIAGNOSIS — M86452 Chronic osteomyelitis with draining sinus, left femur: Secondary | ICD-10-CM | POA: Diagnosis not present

## 2021-07-31 DIAGNOSIS — Z992 Dependence on renal dialysis: Secondary | ICD-10-CM | POA: Diagnosis not present

## 2021-07-31 DIAGNOSIS — I10 Essential (primary) hypertension: Secondary | ICD-10-CM | POA: Diagnosis not present

## 2021-07-31 DIAGNOSIS — N186 End stage renal disease: Secondary | ICD-10-CM | POA: Diagnosis not present

## 2021-07-31 DIAGNOSIS — S72352A Displaced comminuted fracture of shaft of left femur, initial encounter for closed fracture: Secondary | ICD-10-CM | POA: Diagnosis not present

## 2021-07-31 DIAGNOSIS — S7292XA Unspecified fracture of left femur, initial encounter for closed fracture: Secondary | ICD-10-CM | POA: Diagnosis not present

## 2021-07-31 LAB — CBC
HCT: 24 % — ABNORMAL LOW (ref 36.0–46.0)
Hemoglobin: 7.3 g/dL — ABNORMAL LOW (ref 12.0–15.0)
MCH: 28.9 pg (ref 26.0–34.0)
MCHC: 30.4 g/dL (ref 30.0–36.0)
MCV: 94.9 fL (ref 80.0–100.0)
Platelets: 284 10*3/uL (ref 150–400)
RBC: 2.53 MIL/uL — ABNORMAL LOW (ref 3.87–5.11)
RDW: 16.3 % — ABNORMAL HIGH (ref 11.5–15.5)
WBC: 16.2 10*3/uL — ABNORMAL HIGH (ref 4.0–10.5)
nRBC: 0 % (ref 0.0–0.2)

## 2021-07-31 LAB — BASIC METABOLIC PANEL
Anion gap: 9 (ref 5–15)
BUN: 58 mg/dL — ABNORMAL HIGH (ref 6–20)
CO2: 26 mmol/L (ref 22–32)
Calcium: 8.7 mg/dL — ABNORMAL LOW (ref 8.9–10.3)
Chloride: 94 mmol/L — ABNORMAL LOW (ref 98–111)
Creatinine, Ser: 6.73 mg/dL — ABNORMAL HIGH (ref 0.44–1.00)
GFR, Estimated: 7 mL/min — ABNORMAL LOW (ref >60)
Glucose, Bld: 316 mg/dL — ABNORMAL HIGH (ref 70–99)
Potassium: 3.9 mmol/L (ref 3.5–5.1)
Sodium: 129 mmol/L — ABNORMAL LOW (ref 135–145)

## 2021-07-31 LAB — EXPECTORATED SPUTUM ASSESSMENT W GRAM STAIN, RFLX TO RESP C

## 2021-07-31 LAB — GLUCOSE, CAPILLARY
Glucose-Capillary: 327 mg/dL — ABNORMAL HIGH (ref 70–99)
Glucose-Capillary: 268 mg/dL — ABNORMAL HIGH (ref 70–99)
Glucose-Capillary: 166 mg/dL — ABNORMAL HIGH (ref 70–99)
Glucose-Capillary: 199 mg/dL — ABNORMAL HIGH (ref 70–99)
Glucose-Capillary: 273 mg/dL — ABNORMAL HIGH (ref 70–99)

## 2021-07-31 MED ORDER — EPOETIN ALFA 10000 UNIT/ML IJ SOLN
INTRAMUSCULAR | Status: AC
  Filled 2021-07-31: qty 1

## 2021-07-31 MED ORDER — INSULIN GLARGINE-YFGN 100 UNIT/ML ~~LOC~~ SOLN
12.0000 [IU] | Freq: Two times a day (BID) | SUBCUTANEOUS | Status: DC
  Administered 2021-07-31: 12 [IU] via SUBCUTANEOUS
  Filled 2021-07-31 (×2): qty 0.12

## 2021-07-31 NOTE — Progress Notes (Addendum)
0858 Transport here to take pt to dialysis at this time  1254 Pt returned to room from dialysis at this time.  1634 Sputum culture sent down to lab.

## 2021-07-31 NOTE — Progress Notes (Signed)
Hemodialysis Post Treatment Note:  Tx date:07/31/2021 Tx time: 3 hours Access: left AVF UF Removed: 1L  Note: HD completed. Tolerated well. No complications. Patient asymptomatic

## 2021-07-31 NOTE — Progress Notes (Signed)
[facility]  ROUNDING NOTE   Subjective:   Doing fair today Laying in the bed eating breakfast Denies any acute complaints Work-up for left hip infected hardware with orthopedics is ongoing. Patient seen during dialysis Tolerating well    HEMODIALYSIS FLOWSHEET:  Blood Flow Rate (mL/min): 400 mL/min Arterial Pressure (mmHg): -190 mmHg Venous Pressure (mmHg): 250 mmHg Transmembrane Pressure (mmHg): 50 mmHg Ultrafiltration Rate (mL/min): 510 mL/min Dialysate Flow Rate (mL/min): 500 ml/min Conductivity: 14 Conductivity: 14 Dialysis Fluid Bolus: Normal Saline Bolus Amount (mL): 250 mL DO NOT USE: Dialysate Change: Other (comment) (bath changed to 3K)    Objective:  Vital signs in last 24 hours:  Temp:  [98 F (36.7 C)-98.6 F (37 C)] 98.5 F (36.9 C) (05/31 0909) Pulse Rate:  [53-78] 74 (05/31 1000) Resp:  [13-20] 13 (05/31 1000) BP: (126-157)/(49-68) 141/52 (05/31 1000) SpO2:  [95 %-100 %] 95 % (05/31 1000) Weight:  [89 kg] 89 kg (05/31 0909)  Weight change:  Filed Weights   07/29/21 0959 07/29/21 1330 07/31/21 0909  Weight: 86.2 kg 84.6 kg 89 kg    Intake/Output: I/O last 3 completed shifts: In: 540 [P.O.:440; IV Piggyback:100] Out: -    Intake/Output this shift:  No intake/output data recorded.  Physical Exam: General: NAD  Head: Normocephalic, atraumatic. Moist oral mucosal membranes  Eyes: Anicteric  Lungs:  Clear to auscultation,normal effort  Heart: Regular rate and rhythm  Abdomen:  Soft, nontender  Extremities:  No peripheral edema. Lt BKA  Neurologic: Alert, able to answer all questions  Skin: No lesions  Access: Lt AVF, good thrill    Basic Metabolic Panel: Recent Labs  Lab 07/27/21 0433 07/28/21 0401 07/28/21 0822 07/29/21 0420 07/30/21 0345 07/31/21 0329  NA 132* 128*  --  128* 132* 129*  K 4.0 4.3  --  5.0 4.0 3.9  CL 94* 92*  --  96* 97* 94*  CO2 28 26  --  24 28 26   GLUCOSE 321* 437* 443* 314* 372* 316*  BUN 60*  74*  --  89* 42* 58*  CREATININE 6.79* 7.87*  --  9.03* 5.24* 6.73*  CALCIUM 8.8* 8.7*  --  8.9 8.9 8.7*     Liver Function Tests: No results for input(s): AST, ALT, ALKPHOS, BILITOT, PROT, ALBUMIN in the last 168 hours.  No results for input(s): LIPASE, AMYLASE in the last 168 hours. No results for input(s): AMMONIA in the last 168 hours.  CBC: Recent Labs  Lab 07/25/21 0509 07/27/21 0433 07/28/21 0401 07/29/21 0420 07/30/21 0345 07/31/21 0329  WBC 16.1*  16.8* 25.0*  24.1* 26.9* 23.8* 17.3* 16.2*  NEUTROABS 10.5* 17.2*  --   --   --   --   HGB 7.6*  7.6* 7.4*  7.2* 6.7* 7.4* 7.7* 7.3*  HCT 24.0*  23.8* 23.9*  23.7* 22.2* 23.6* 25.3* 24.0*  MCV 92.0  92.2 94.5  93.7 95.7 93.3 94.4 94.9  PLT 287  286 351  333 350 309 299 284     Cardiac Enzymes: No results for input(s): CKTOTAL, CKMB, CKMBINDEX, TROPONINI in the last 168 hours.  BNP: Invalid input(s): POCBNP  CBG: Recent Labs  Lab 07/30/21 0819 07/30/21 1215 07/30/21 1701 07/30/21 2103 07/31/21 0809  GLUCAP 327* 280* 362* 360* 60*     Microbiology: Results for orders placed or performed during the hospital encounter of 07/21/21  Culture, blood (Routine X 2) w Reflex to ID Panel     Status: None   Collection Time: 07/22/21  2:44 PM   Specimen: Right Antecubital; Blood  Result Value Ref Range Status   Specimen Description RIGHT ANTECUBITAL  Final   Special Requests   Final    BOTTLES DRAWN AEROBIC AND ANAEROBIC Blood Culture adequate volume   Culture   Final    NO GROWTH 5 DAYS Performed at [facility], [address] [place], [place] [address]    Report Status 07/27/2021 FINAL  Final  Culture, blood (Routine X 2) w Reflex to ID Panel     Status: None   Collection Time: 07/22/21  2:46 PM   Specimen: BLOOD RIGHT HAND  Result Value Ref Range Status   Specimen Description BLOOD RIGHT HAND  Final   Special Requests   Final    BOTTLES DRAWN AEROBIC AND ANAEROBIC Blood Culture  adequate volume   Culture   Final    NO GROWTH 5 DAYS Performed at [facility], [address], [place], [place] [address]    Report Status 07/27/2021 FINAL  Final  MRSA Next Gen by PCR, Nasal     Status: None   Collection Time: 07/27/21 12:18 PM   Specimen: Nasal Mucosa; Nasal Swab  Result Value Ref Range Status   MRSA by PCR Next Gen NOT DETECTED NOT DETECTED Final    Comment: (NOTE) The GeneXpert MRSA Assay (FDA approved for NASAL specimens only), is one component of a comprehensive MRSA colonization surveillance program. It is not intended to diagnose MRSA infection nor to guide or monitor treatment for MRSA infections. Test performance is not FDA approved in patients less than 76 years old. Performed at [facility], [address] [place], [place] [address]   Culture, blood (Routine X 2) w Reflex to ID Panel     Status: None (Preliminary result)   Collection Time: 07/27/21 12:31 PM   Specimen: BLOOD  Result Value Ref Range Status   Specimen Description BLOOD RIGHT ANTECUBITAL  Final   Special Requests   Final    BOTTLES DRAWN AEROBIC AND ANAEROBIC Blood Culture adequate volume   Culture   Final    NO GROWTH 4 DAYS Performed at [facility], [address], [place], [place] [address]    Report Status PENDING  Incomplete  Culture, blood (Routine X 2) w Reflex to ID Panel     Status: None (Preliminary result)   Collection Time: 07/27/21 12:33 PM   Specimen: BLOOD  Result Value Ref Range Status   Specimen Description BLOOD BLOOD RIGHT HAND  Final   Special Requests   Final    BOTTLES DRAWN AEROBIC AND ANAEROBIC Blood Culture adequate volume   Culture   Final    NO GROWTH 4 DAYS Performed at [facility], [address] [place], [place] [address]    Report Status PENDING  Incomplete  Urine Culture     Status: None   Collection Time: 07/28/21  5:46 PM   Specimen: Urine, Clean Catch  Result Value Ref Range Status   Specimen  Description   Final    URINE, CLEAN CATCH Performed at [facility], [address], [place], [place] [address]    Special Requests   Final    NONE Performed at [facility], [address], [place],  [address]    Culture   Final    NO GROWTH Performed at [facility], [place] [address], [place],  [address]    Report Status 07/30/2021 FINAL  Final  Aerobic Culture w  Gram Stain (superficial specimen)     Status: None (Preliminary result)   Collection Time: 07/30/21  6:26 PM   Specimen: Wound  Result Value Ref Range Status   Specimen Description   Final    WOUND Performed at [facility], [address], [place], [place] [address]    Special Requests   Final    THIGH Performed at [facility], [address] [place], [place] [address]    Gram Stain   Final    FEW WBC PRESENT, PREDOMINANTLY PMN FEW SQUAMOUS EPITHELIAL CELLS PRESENT NO ORGANISMS SEEN Performed at [facility], [place] [address] [place], [place] [address]    Culture PENDING  Incomplete   Report Status PENDING  Incomplete    Coagulation Studies: No results for input(s): LABPROT, INR in the last 72 hours.  Urinalysis: No results for input(s): COLORURINE, LABSPEC, PHURINE, GLUCOSEU, HGBUR, BILIRUBINUR, KETONESUR, PROTEINUR, UROBILINOGEN, NITRITE, LEUKOCYTESUR in the last 72 hours.  Invalid input(s): APPERANCEUR     Imaging: MR HIP LEFT WO CONTRAST  Result Date: 07/29/2021 CLINICAL DATA:  Left hip septic arthritis suspected. Soft tissue infection suspected. End-stage renal disease on hemodialysis. History of left below-the-knee amputation in 2021. Insulin-dependent diabetes. Fell from transfer from wheelchair recliner about 1 week prior to admission. EXAM: MR OF THE LEFT HIP WITHOUT CONTRAST TECHNIQUE: Multiplanar, multisequence MR imaging was performed. No intravenous contrast was administered. COMPARISON:  Pelvis and left hip radiographs  07/29/2021, 01/19/2020, 12/28/2019; CT abdomen and pelvis 02/07/2020 FINDINGS: Bones: Postsurgical changes are again seen of left cephalomedullary nail fixation of a left proximal femoral intertrochanteric fracture seen acutely on 12/28/2019 radiographs and fixated 12/29/2019. On 07/29/2021 there is increased lucency around the femoral intertrochanteric region and femoral neck portions of the hardware suspicious for hardware loosening. There is decreased T1 and intermediate T2 signal heterogeneous material broadly contacting the posterosuperior aspect of the left proximal femoral intertrochanteric region and measuring up to approximately 6.6 x 4.2 x 6.4 cm. This material appears to extend from the junction of the superior greater trochanter in the superior tip of the long axis proximal femoral diaphyseal intramedullary rod (axial series 6 images 10 through 12). This is suspicious for complex fluid extending posteriorly from the joint space, and is suspicious for an infectious process. Within the more distal aspect of the hardware where the distal transverse screw fixates the proximal femoral diaphyseal nail, there is increased lucency on 07/29/2021 radiographs, and similar decreased T1 and intermediate T2 signal heterogeneous material extends posteriorly and laterally from the superior aspect of the proximal nail, measuring up to 3.6 x 4.0 by 3.3 cm suspicious for an additional infected pocket of fluid. There is moderate to high-grade soft tissue edema and swelling throughout the proximal lateral left thigh, greatest around these suspected infectious fluid collections. There also appears to be similar complex fluid material extending laterally from the lateral aspect of the femoral neck nail where there is surrounding lucency within the bone. This complex fluid appears to extend laterally through the proximal lateral hip skin surface. Recommend clinical correlation for a draining sinus tract through the lateral skin  surface (coronal series 3, image 11 and axial series 5, image 33). This infected region is in between the two above described more superior and inferior fluid collections respectively. Left hip: Articular cartilage and labrum Articular cartilage: Moderate superior left femoroacetabular cartilage thinning. Labrum: High-grade attenuation of the anterior superior left acetabular labrum. Joint effusion Joint effusion:  No left hip joint effusion is seen.  Muscles and tendons Muscles and tendons: Diffuse edema throughout the left gluteus minimus gluteus medius, rectus femoris, and visualized proximal vastus lateralis and long head of the biceps femoris muscles. More mild edema within the right gluteus minimus greater than gluteus medius muscles. Mild fluid bright signal within the left common hamstring tendon origin, likely a small partial-thickness tear. Trace fluid along the peripheral lateral border of the left rectus abdominis muscle insertion on the pubic symphysis (axial series 5, images 23-29). Other findings Miscellaneous:   None. IMPRESSION: 1. Postsurgical changes are seen of left cephalomedullary nail fixation of a proximal left femoral intertrochanteric fracture seen acutely on 12/28/2019 radiographs. On 07/29/2021 radiographs there is increased lucency around the femoral neck screw and hardware in the intertrochanteric region as well as the distal (proximal femoral diaphyseal) interlocking screw. In these regions including the posterosuperior aspect of the greater trochanter, junction of the femoral neck and longer stem femoral diaphyseal intramedullary nail and junction of the distal interlocking screw and tip distal femoral intramedullary nail, there is complex fluid suspicious for infectious material extending laterally. There appears to be a sinus tract extending to the skin surface within the middle of these 3 infected regions. 2. Nonspecific myositis of the surrounding left hip musculature. This also  may represent infection of the muscle tissues. 3. Small partial-thickness tear of the common left hamstring tendon origin. Electronically Signed   By: Yvonne Kendall M.D.   On: 07/29/2021 18:38   DG HIP UNILAT WITH PELVIS 2-3 VIEWS LEFT  Result Date: 07/29/2021 CLINICAL DATA:  Left hip pain.  Old left hip fracture. EXAM: DG HIP (WITH OR WITHOUT PELVIS) 2-3V LEFT COMPARISON:  CT on 02/07/2020 FINDINGS: Intramedullary rod and screw are again seen transfixing an old intertrochanteric left hip fracture. No evidence of acute fracture or dislocation. Generalized osteopenia is noted. IMPRESSION: Old intertrochanteric left hip fracture. No acute findings. Electronically Signed   By: Marlaine Hind M.D.   On: 07/29/2021 16:37     Medications:    meropenem (MERREM) IV Stopped (07/30/21 2152)    amitriptyline  25 mg Oral QHS   amLODipine  5 mg Oral Daily   atorvastatin  40 mg Oral q1800   carvedilol  6.25 mg Oral BID   Chlorhexidine Gluconate Cloth  6 each Topical Q0600   citalopram  20 mg Oral Daily   DULoxetine  60 mg Oral Daily   epoetin (EPOGEN/PROCRIT) injection  10,000 Units Intravenous Q M,W,F-HD   insulin aspart  0-9 Units Subcutaneous TID WC   insulin aspart  5 Units Subcutaneous TID WC   insulin glargine-yfgn  15 Units Subcutaneous QHS   pantoprazole  40 mg Oral Daily   sevelamer carbonate  1,600 mg Oral TID WC   cyclobenzaprine, HYDROmorphone (DILAUDID) injection, nicotine, ondansetron **OR** ondansetron (ZOFRAN) IV, oxyCODONE-acetaminophen, polyethylene glycol  Assessment/ Plan:  [name] is a 56 y.o.  female with past medical history including hyperlipidemia, hypertension, left BKA, diabetes, chronic pain syndrome, and end-stage renal disease on hemodialysis, who was admitted to [facility] on 07/21/2021 for Acidosis [E87.20] Hyperkalemia [E87.5] Elevated BUN [R79.9] ESRD (end stage renal disease) on dialysis ([place]) [N18.6, Z99.2] Femur fracture, left (HCC) [S72.92XA] Closed  fracture of left femur, unspecified fracture morphology, unspecified portion of femur, initial encounter ([place]) [S72.92XA] Closed fracture of left distal femur ([place]) [S72.402A]  [facility]/MWF/left aVF/83 kg  End-stage renal disease on dialysis.  Will maintain outpatient schedule if possible.    -Next hemodialysis planned for Friday Dialysis coordinator  currently seeking outpatient dialysis clinic due to patient's rehab needs.  Search remains in progress  2. Anemia of chronic kidney disease Normocytic Lab Results  Component Value Date   HGB 7.3 (L) 07/31/2021   Hemoglobin below desired target. Continue EPO with dialysis.   3. Secondary Hyperparathyroidism:  Lab Results  Component Value Date   PTH 44 01/11/2020   PTH Comment 01/11/2020   CALCIUM 8.7 (L) 07/31/2021   CAION 1.10 (L) 04/07/2017   PHOS 5.1 (H) 02/20/2020   Bone minerals remain within acceptable range.  Continue sevelamer ordered with meals  4.  Hypertension with chronic kidney disease.  Home regimen includes amlodipine, carvedilol and hydralazine.  Currently receiving these medications.  Monitor blood pressure closely.  5. Diabetes mellitus type II with chronic kidney disease: insulin dependent. Home regimen includes to send NovoLog 70/30. Most recent hemoglobin A1c is 7.1%  on 07/22/21.          -Management as per hospitalist team 6.  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 -Evaluated by orthopedics -Currently on meropenem for healthcare associated pneumonia -Chronic antibiotic suppression recommended by orthopedics.   LOS: 9 [name] 5/31/202310:32 AM

## 2021-07-31 NOTE — Progress Notes (Signed)
Progress Note   Patient: [name] NOI:[id number] DOB: 08-22-1965 DOA: 07/21/2021     9 DOS: the patient was seen and examined on 07/31/2021   Brief hospital course: [name] is a 56 year old female with history of hypertension, hyperlipidemia, end-stage renal disease on HD MWF, history of left BKA in 2021, depression, anxiety, insulin-dependent diabetes mellitus, insomnia, chronic pain syndrome, who presents emergency department for chief concerns of pain from falling during transfer from wheelchair to recliner about 1 week ago. She missed dialysis for whole week.  Upon arriving the hospital, she was found to have a distal left femur fracture, deemed no surgery by orthopedics.  But no weightbearing.  Patient is also started on dialysis.   Was waiting on patient to decide on SNF facility for rehab since she was initially advised inpatient rehab but later stated she did not think she would be able to participate in intensive therapy. Pending dialysis spot but SNF placement was confirmed. Discharge was not considered however as WBC had been trending up, workup showed multifocal pneumonia on CXR despite no significant symptoms, started abx 07/27/21, obtained UA for completeness (still makes some urine and this was obtained 05/28 and abnormal), Urine Cx, sputum Cx, blood Cx are all negative.  Also 05/28 drained superficial abscess L hip and sent for culture which was also negative.  d/w ortho 05/29 and obtaining XR and MRI to eval for L hip septic arthritis. 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 05/30 Ortho recommended I&D and hardware removal and place abx beads but pt does not want to proceed w/ surgery despite extensive discussion risk/benefit. ID consulted.   5/31: Had a long discussion with Ortho.  Patient will be very high risk for any surgical intervention with concern of nonhealing wound and  worsening of her underlying comorbidities.  Most likely will have infection recurrent.  They also does not want any incision and drainage as requested by ID to clean the pockets stating that this infection will soon recur as she will need a very extensive debridement. Patient initially received cefepime which was later switched with meropenem based on her prior history of resistant Enterobacter and bacteroids. This is a very unfortunate situation, no definitive solution at this time.     Assessment and Plan: * Femur fracture, left [facility])  Orthopedic service recommends no surgery   Orthopedic service recommends brace in flexion of the left knee, nonweightbearing  Patient to follow-up outpatient with orthopedic provider in 1 week after discharge  rehab/SNF placement   Septic arthritis of hip ([place]) Superficial abscess described as recurrent since previous surgery, drained at bedside 05/28 and obtained XR and MRI - concerning for 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   05/30 Ortho recommended I&D and hardware removal and place abx beads but pt does not want to proceed w/ surgery despite extensive discussion risk/benefit.   ID consulted 05/30  Cultures from L hip abscess obtained 05/28 remain negative.  ID switched antibiotics to meropenem based on prior culture results where she grew resistant Enterobacter and bacteroids.  Another discussion with orthopedic and they are not going to perform any procedure as risk is very more than benefit.  Patient also does not want any more procedures.  -Continue with meropenem, might need long-term suppressive antibiotics on discharge, appreciate ID help    Skin abscess L hip Drained copious purulent fluid, sent for  wound culture and it remains negative. -Continue with wound care  HCAP (healthcare-associated pneumonia) WBC trending up, w/u reveals pneumonia on CXR despite no  significant symptoms though pt does have smoker's cough, no SOB. Poorly controlled diabetes and pt's lack of effort w/ mobilization/inceptive spirometry likely contributing.   Not much upper respiratory symptoms and she completed the required course for presumed pneumonia with cefepime and Zithromax  UTI (urinary tract infection) UA able to be collected 07/28/21, and remain negative  Patient received antibiotics for concern of pneumonia and left septic hip  Type 2 diabetes mellitus with renal complication (HCC) CBG remained elevated. -Increase Semglee to 12 units twice daily. -Continue with 5 units with meals -Continue with renal SSI  ESRD on dialysis [facility])  Nephrology following.  Patient need a new confirmed seat before discharge to rehab  TOBACCO DEPENDENCE - Nicotine patch as needed for nicotine craving   Anxiety and depression  Amitriptyline 25 mg nightly, Celexa 20 mg daily, duloxetine 60 mg daily  Essential hypertension  Amlodipine 5 mg daily, carvedilol 6.25 mg p.o. twice daily  Hydralazine 5 mg IV every 6 hours.  For SBP greater than 180,   Hyponatremia Corrected sodium at 132.  Most likely pseudohyponatremia secondary to hyperglycemia. -Continue to monitor  Hyperkalemia Resolved  Monitor bmp  Leukocytosis CXR (+) multifocal pneumonia, UA abnormal, also w/u for septic arthritis   Essential hypertension, benign-resolved as of 07/27/2021 - Patient takes amlodipine 5 mg daily, carvedilol 6.25 mg p.o. twice daily, these have been resumed for 07/22/2021 due to low normotensive in setting of requiring hemodialysis - Hydralazine 100 mg 3 times daily have not been resumed due to the same     Subjective: Patient was seen and examined after the dialysis today.  She was very frustrated about her recurrent infections.  Physical Exam: Vitals:   07/31/21 1215 07/31/21 1218 07/31/21 1256 07/31/21 1527  BP: (!) 146/57 (!) 153/52 (!) 153/54 (!) 136/54  Pulse: 80 80 78  75  Resp: 16 15 18 18   Temp:  98.5 F (36.9 C) 98.2 F (36.8 C) 98.6 F (37 C)  TempSrc:  Oral Oral Oral  SpO2: 99% 100% 99% 99%  Weight:  87.7 kg    Height:       General.     In no acute distress. Pulmonary.  Lungs clear bilaterally, normal respiratory effort. CV.  Regular rate and rhythm, no JVD, rub or murmur. Abdomen.  Soft, nontender, nondistended, BS positive. CNS.  Alert and oriented .  No focal neurologic deficit. Extremities.  Left BKA, left hip with a clean bandage Psychiatry.  Judgment and insight appears normal.  Data Reviewed: I personally reviewed prior notes, labs and images  Family Communication: Discussed with patient  Disposition: Status is: Inpatient Remains inpatient appropriate because: Severity of illness   Planned Discharge Destination: Skilled nursing facility  Time spent: 50 minutes  This record has been created using Systems analyst. Errors have been sought and corrected,but may not always be located. Such creation errors do not reflect on the standard of care.  Author: Lorella Nimrod, MD 07/31/2021 5:07 PM  For on call review [email or web address].

## 2021-07-31 NOTE — Assessment & Plan Note (Signed)
Corrected sodium at 132.  Most likely pseudohyponatremia secondary to hyperglycemia. -Continue to monitor

## 2021-07-31 NOTE — Progress Notes (Signed)
Pt is not feeling too well Says she is stressed about the left hip infection Does not want to make the decision to have surgery O/e awake and alert BP (!) 136/54 (BP Location: Right Arm)   Pulse 75   Temp 98.6 F (37 C) (Oral)   Resp 18   Ht 5\' 10"  (1.778 m)   Wt 87.7 kg   SpO2 99%   BMI 27.74 kg/m   Chest b/l air entry [id number] Abd soft Left BKA- left lat thigh wound covered with dressing  Labs    Latest Ref Rng & Units 07/31/2021    3:29 AM 07/30/2021    3:45 AM 07/29/2021    4:20 AM  CBC  WBC 4.0 - 10.5 K/uL 16.2   17.3   23.8    Hemoglobin 12.0 - 15.0 g/dL 7.3   7.7   7.4    Hematocrit 36.0 - 46.0 % 24.0   25.3   23.6    Platelets 150 - 400 K/uL 284   299   309         Latest Ref Rng & Units 07/31/2021    3:29 AM 07/30/2021    3:45 AM 07/29/2021    4:20 AM  CMP  Glucose 70 - 99 mg/dL 316   372   314    BUN 6 - 20 mg/dL 58   42   89    Creatinine 0.44 - 1.00 mg/dL 6.73   5.24   9.03    Sodium 135 - 145 mmol/L 129   132   128    Potassium 3.5 - 5.1 mmol/L 3.9   4.0   5.0    Chloride 98 - 111 mmol/L 94   97   96    CO2 22 - 32 mmol/L 26   28   24     Calcium 8.9 - 10.3 mg/dL 8.7   8.9   8.9      Micro Wound culture pending  Impression/recommendation 56 year old female with history of end-stage renal disease on dialysis poorly controlled diabetes mellitus and left BKA presenting after a fall No comminuted fracture of the lower femur just above the knee on the left side.  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 I took culture from the superficial discharge after cleaning with saline.  If that does not give an answer will ask IR to aspirate abscess for culture. Patient is currently on meropenem Antibiotics alone is not going to be enough to clear this infection as she has  had it for nearly a year and a half now.  The hardware has to be removed completely for clearance of infection.  But patient is not keen to have any further surgery.  Orthopedic surgeon also says because of her comorbidities this would not be an easy surgery.  History of Enterobacter and Bacteroides infection at the same site in the past.  So patient is currently on meropenem instead of cefepime.  Await cultures to decide on final antibiotics.  Would be better to give it to her during dialysis.  Left BKA  Stage renal disease on hemodialysis  Poorly controlled diabetes mellitus  Anemia of chronic disease  Discussed the management with the patient and the care team

## 2021-07-31 NOTE — Progress Notes (Signed)
Update: Referral sent to [facility] for review. Waiting on Hep B antibody to result. Planning on start date Friday 6/2. Waiting on clinic acceptance.

## 2021-07-31 NOTE — Progress Notes (Signed)
PT Cancellation Note  Patient Details Name: JENNETT TARBELL MRN: [id number] DOB: 1965-05-03   Cancelled Treatment:    Reason Eval/Treat Not Completed: Patient at procedure or test/unavailable (Pt off unit for HD session. Will resume services at later date/time.)   Phoenyx Paulsen C PT, DPT  07/31/2021, 4:48 PM

## 2021-08-01 DIAGNOSIS — M86452 Chronic osteomyelitis with draining sinus, left femur: Secondary | ICD-10-CM | POA: Diagnosis not present

## 2021-08-01 DIAGNOSIS — S7292XA Unspecified fracture of left femur, initial encounter for closed fracture: Secondary | ICD-10-CM

## 2021-08-01 DIAGNOSIS — N186 End stage renal disease: Secondary | ICD-10-CM | POA: Diagnosis not present

## 2021-08-01 DIAGNOSIS — Z992 Dependence on renal dialysis: Secondary | ICD-10-CM | POA: Diagnosis not present

## 2021-08-01 LAB — GLUCOSE, CAPILLARY
Glucose-Capillary: 322 mg/dL — ABNORMAL HIGH (ref 70–99)
Glucose-Capillary: 178 mg/dL — ABNORMAL HIGH (ref 70–99)
Glucose-Capillary: 120 mg/dL — ABNORMAL HIGH (ref 70–99)
Glucose-Capillary: 165 mg/dL — ABNORMAL HIGH (ref 70–99)

## 2021-08-01 LAB — BASIC METABOLIC PANEL
Anion gap: 7 (ref 5–15)
BUN: 33 mg/dL — ABNORMAL HIGH (ref 6–20)
CO2: 28 mmol/L (ref 22–32)
Calcium: 8.5 mg/dL — ABNORMAL LOW (ref 8.9–10.3)
Chloride: 97 mmol/L — ABNORMAL LOW (ref 98–111)
Creatinine, Ser: 4.64 mg/dL — ABNORMAL HIGH (ref 0.44–1.00)
GFR, Estimated: 11 mL/min — ABNORMAL LOW (ref >60)
Glucose, Bld: 381 mg/dL — ABNORMAL HIGH (ref 70–99)
Potassium: 3.6 mmol/L (ref 3.5–5.1)
Sodium: 132 mmol/L — ABNORMAL LOW (ref 135–145)

## 2021-08-01 LAB — CULTURE, BLOOD (ROUTINE X 2)
Culture: NO GROWTH
Culture: NO GROWTH
Special Requests: ADEQUATE
Special Requests: ADEQUATE

## 2021-08-01 LAB — CBC
HCT: 25.4 % — ABNORMAL LOW (ref 36.0–46.0)
Hemoglobin: 7.7 g/dL — ABNORMAL LOW (ref 12.0–15.0)
MCH: 29.2 pg (ref 26.0–34.0)
MCHC: 30.3 g/dL (ref 30.0–36.0)
MCV: 96.2 fL (ref 80.0–100.0)
Platelets: 277 10*3/uL (ref 150–400)
RBC: 2.64 MIL/uL — ABNORMAL LOW (ref 3.87–5.11)
RDW: 16.1 % — ABNORMAL HIGH (ref 11.5–15.5)
WBC: 14.1 10*3/uL — ABNORMAL HIGH (ref 4.0–10.5)
nRBC: 0 % (ref 0.0–0.2)

## 2021-08-01 LAB — HEPATITIS B SURFACE ANTIBODY, QUANTITATIVE: Hep B S AB Quant (Post): <3.1 m[IU]/mL — ABNORMAL LOW (ref >9.9)

## 2021-08-01 MED ORDER — INSULIN GLARGINE-YFGN 100 UNIT/ML ~~LOC~~ SOLN
15.0000 [IU] | Freq: Two times a day (BID) | SUBCUTANEOUS | Status: DC
  Administered 2021-08-01 (×2): 15 [IU] via SUBCUTANEOUS
  Filled 2021-08-01 (×3): qty 0.15

## 2021-08-01 MED ORDER — INSULIN ASPART 100 UNIT/ML IJ SOLN
6.0000 [IU] | Freq: Three times a day (TID) | INTRAMUSCULAR | Status: DC
  Administered 2021-08-01 – 2021-08-04 (×10): 6 [IU] via SUBCUTANEOUS
  Filled 2021-08-01 (×10): qty 1

## 2021-08-01 NOTE — Plan of Care (Signed)

## 2021-08-01 NOTE — Progress Notes (Signed)
OT Cancellation Note  Patient Details Name: [name] MRN: [id number] DOB: 06/01/1965   Cancelled Treatment:    Reason Eval/Treat Not Completed: Pain limiting ability to participate. Pt refusing OT intervention this session and reports "pain all over". She endorses, " Even lifting the arm (holds up L arm) causing shooting pain down my L LE. OT to re-attempt at next available time.   Darleen Crocker, MS, OTR/L , CBIS ascom [phone]  08/01/21, 3:12 PM

## 2021-08-01 NOTE — Progress Notes (Signed)
Date of Admission:  07/21/2021    ID: [name] is a 56 y.o. female  Principal Problem:   Femur fracture, left ([place]) Active Problems:   TOBACCO DEPENDENCE   Anxiety and depression   Insomnia   Type 2 diabetes mellitus with renal complication (HCC)   Obesity (BMI 30.0-34.9)   Hyponatremia   ESRD (end stage renal disease) on dialysis ([place])   Leukocytosis   Essential hypertension   History of CVA (cerebrovascular accident) without residual deficits   Hyperkalemia   Closed fracture of left distal femur (HCC)   Uncontrolled type 2 diabetes mellitus with hypoglycemia, with long-term current use of insulin ([place])   HCAP (healthcare-associated pneumonia)   Skin abscess L hip   UTI (urinary tract infection)   Septic arthritis of hip (HCC)    Subjective: Pt with daughter at bed side She is upset when I talked to her about there current status, and 1 neg  cultures and the need for IR aspiration She did not  want to talk to me further She is expecting a call from [place] tomorrow  Medications:   amitriptyline  25 mg Oral QHS   amLODipine  5 mg Oral Daily   atorvastatin  40 mg Oral q1800   carvedilol  6.25 mg Oral BID   Chlorhexidine Gluconate Cloth  6 each Topical Q0600   citalopram  20 mg Oral Daily   DULoxetine  60 mg Oral Daily   epoetin (EPOGEN/PROCRIT) injection  10,000 Units Intravenous Q M,W,F-HD   insulin aspart  0-9 Units Subcutaneous TID WC   insulin aspart  6 Units Subcutaneous TID WC   insulin glargine-yfgn  15 Units Subcutaneous BID   pantoprazole  40 mg Oral Daily   sevelamer carbonate  1,600 mg Oral TID WC    Objective: Vital signs in last 24 hours: Temp:  [98 F (36.7 C)-98.8 F (37.1 C)] 98.7 F (37.1 C) (06/01 1140) Pulse Rate:  [63-75] 67 (06/01 1140) Resp:  [15-20] 17 (06/01 1140) BP: (131-147)/(50-62) 139/62 (06/01 1140) SpO2:  [98 %-100 %] 100 % (06/01 1140) Lab Results Recent Labs    07/31/21 0329 08/01/21 0333  WBC 16.2* 14.1*   HGB 7.3* 7.7*  HCT 24.0* 25.4*  NA 129* 132*  K 3.9 3.6  CL 94* 97*  CO2 26 28  BUN 58* 33*  CREATININE 6.73* 4.64*   Microbiology: 07/30/2021 culture from the wound so far no growth 07/27/2021 blood culture negative    Assessment/Plan: 55 year old female with history of end-stage renal disease on dialysis, poorly controlled diabetes mellitus and left BKA presented to the hospital after a fall. Has a new comminuted fracture of the lower femur just above the knee on the left side. Currently no surgical management as per orthopedics She has severe pain  Chronic left infection at the site of previous fracture and intramedullary nailing Has chronic sinus with osteomyelitis. The culture that I took from the superficial discharge after cleaning with saline has not grown anything so far We will ask IR to aspirate pus and sent for culture This is going to be tough to clear this infection with just antibiotic alone.  Patient is not comfortable getting all the hardware out She is waiting for her orthopedic doctor at [facility] to call.  I also informed Dr. Lyla Glassing. And patient is currently on meropenem Why WBC is coming down [place] to give her some antibiotic that can wait for administered during dialysis  Past history of Enterobacter and Bacteroides infection  at the left femur site. Was treated with 6 weeks of antibiotics  Left BKA  ESRD on hemodialysis  Anemia of chronic disease  Poorly controlled diabetes mellitus  Discussed with interventional radiologist  Discussed the management with the patient and her daughter

## 2021-08-01 NOTE — Progress Notes (Signed)
PT Cancellation Note  Patient Details Name: [name] MRN: [id number] DOB: [birth year]   Cancelled Treatment:     PT attempt 2x this date thus far. " I can't do it right now because I'm hurting too bad." Pt was issued pain meds not long ago but states it's not helping. Author will return shortly to try to convince pt to perform OOB activity.Pt has been self limiting and pain limited throughout admission.     Willette Pa 08/01/2021, 2:05 PM

## 2021-08-01 NOTE — Assessment & Plan Note (Signed)
   Orthopedic service recommends no surgery   Orthopedic service recommends brace in flexion of the left knee, nonweightbearing  Patient to follow-up outpatient with orthopedic provider in 1 week after discharge  rehab/SNF placement

## 2021-08-01 NOTE — Progress Notes (Signed)
Inpatient Diabetes Program Recommendations  AACE/ADA: New Consensus Statement on Inpatient Glycemic Control (2015)  Target Ranges:  Prepandial:   less than 140 mg/dL      Peak postprandial:   less than 180 mg/dL (1-2 hours)      Critically ill patients:  140 - 180 mg/dL   Lab Results  Component Value Date   GLUCAP 322 (H) 08/01/2021   HGBA1C 7.1 (H) 07/22/2021    Review of Glycemic Control  Home DM Meds: Lantus 12 QHS      Novolog 70/30 Insulin 10 units TID     Current Orders: Semglee 12 units bid  Novolog 5 units tid meal coverage    Novolog Sensitive Correction Scale/ SSI (0-9 units) TID [facility]  Inpatient Diabetes Program Recommendations:   Fasting CBG 322. Please consider: -Increase hs Semglee to 14 units -Add Novolog correction 0-5 units hs Secure chat to Dr. Reesa Chew.  Thank you, Nani Gasser. Raelin Pixler, RN, MSN, CDE  Diabetes Coordinator Inpatient Glycemic Control Team Team Pager [phone] (8am-5pm) 08/01/2021 9:16 AM

## 2021-08-01 NOTE — Assessment & Plan Note (Signed)
CBG remained elevated. -Increase Semglee to 15 units twice daily. -Increase mealtime coverage to 6 units -Continue with renal SSI

## 2021-08-01 NOTE — Assessment & Plan Note (Signed)
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   05/30 Ortho recommended I&D and hardware removal and place abx beads but pt does not want to proceed w/ surgery despite extensive discussion risk/benefit.   ID consulted 05/30  Cultures from L hip abscess obtained 05/28 remain negative.  ID switched antibiotics to meropenem based on prior culture results where she grew resistant Enterobacter and bacteroids.  Another discussion with orthopedic and they are not going to perform any procedure as risk is very more than benefit.  Patient also does not want any more procedures.  -Continue with meropenem, might need long-term suppressive antibiotics on discharge, appreciate ID help

## 2021-08-01 NOTE — Plan of Care (Signed)
  Problem: Education: Goal: Knowledge of General Education information will improve Description: Including pain rating scale, medication(s)/side effects and non-pharmacologic comfort measures Outcome: Progressing   Problem: Health Behavior/Discharge Planning: Goal: Ability to manage health-related needs will improve Outcome: Progressing   Problem: Clinical Measurements: Goal: Ability to maintain clinical measurements within normal limits will improve Outcome: Progressing   Problem: Clinical Measurements: Goal: Will remain free from infection Outcome: Progressing   Problem: Clinical Measurements: Goal: Respiratory complications will improve Outcome: Progressing   Problem: Clinical Measurements: Goal: Cardiovascular complication will be avoided Outcome: Progressing   Problem: Coping: Goal: Level of anxiety will decrease Outcome: Progressing   Problem: Elimination: Goal: Will not experience complications related to bowel motility Outcome: Progressing   Problem: Safety: Goal: Ability to remain free from injury will improve Outcome: Progressing   Problem: Skin Integrity: Goal: Risk for impaired skin integrity will decrease Outcome: Progressing

## 2021-08-01 NOTE — Progress Notes (Signed)
[facility]  ROUNDING NOTE   Subjective:   Patient resting quietly, alert and oriented Tolerating meals without nausea and vomiting Remains on room air Request to see orthopedist to evaluate fracture in left lower extremity.   Objective:  Vital signs in last 24 hours:  Temp:  [98 F (36.7 C)-98.8 F (37.1 C)] 98.8 F (37.1 C) (06/01 0800) Pulse Rate:  [63-80] 63 (06/01 0800) Resp:  [12-20] 15 (06/01 0729) BP: (127-153)/(46-60) 140/50 (06/01 0800) SpO2:  [95 %-100 %] 99 % (06/01 0800) Weight:  [87.7 kg] 87.7 kg (05/31 1218)  Weight change:  Filed Weights   07/29/21 1330 07/31/21 0909 07/31/21 1218  Weight: 84.6 kg 89 kg 87.7 kg    Intake/Output: I/O last 3 completed shifts: In: 840 [P.O.:740; IV Piggyback:100] Out: 1020 [Emesis/NG output:20; Other:1000]   Intake/Output this shift:  Total I/O In: 100 [IV Piggyback:100] Out: -   Physical Exam: General: NAD  Head: Normocephalic, atraumatic. Moist oral mucosal membranes  Eyes: Anicteric  Lungs:  Clear to auscultation,normal effort  Heart: Regular rate and rhythm  Abdomen:  Soft, nontender  Extremities:  No peripheral edema. Lt BKA  Neurologic: Alert, able to answer all questions  Skin: No lesions  Access: Lt AVF, good thrill    Basic Metabolic Panel: Recent Labs  Lab 07/28/21 0401 07/28/21 0822 07/29/21 0420 07/30/21 0345 07/31/21 0329 08/01/21 0333  NA 128*  --  128* 132* 129* 132*  K 4.3  --  5.0 4.0 3.9 3.6  CL 92*  --  96* 97* 94* 97*  CO2 26  --  24 28 26 28   GLUCOSE 437* 443* 314* 372* 316* 381*  BUN 74*  --  89* 42* 58* 33*  CREATININE 7.87*  --  9.03* 5.24* 6.73* 4.64*  CALCIUM 8.7*  --  8.9 8.9 8.7* 8.5*     Liver Function Tests: No results for input(s): AST, ALT, ALKPHOS, BILITOT, PROT, ALBUMIN in the last 168 hours.  No results for input(s): LIPASE, AMYLASE in the last 168 hours. No results for input(s): AMMONIA in the last 168 hours.  CBC: Recent Labs  Lab  07/27/21 0433 07/28/21 0401 07/29/21 0420 07/30/21 0345 07/31/21 0329 08/01/21 0333  WBC 25.0*  24.1* 26.9* 23.8* 17.3* 16.2* 14.1*  NEUTROABS 17.2*  --   --   --   --   --   HGB 7.4*  7.2* 6.7* 7.4* 7.7* 7.3* 7.7*  HCT 23.9*  23.7* 22.2* 23.6* 25.3* 24.0* 25.4*  MCV 94.5  93.7 95.7 93.3 94.4 94.9 96.2  PLT 351  333 350 309 299 284 277     Cardiac Enzymes: No results for input(s): CKTOTAL, CKMB, CKMBINDEX, TROPONINI in the last 168 hours.  BNP: Invalid input(s): POCBNP  CBG: Recent Labs  Lab 07/31/21 0809 07/31/21 1252 07/31/21 1731 07/31/21 2118 08/01/21 0726  GLUCAP 268* 166* 199* 273* 322*     Microbiology: Results for orders placed or performed during the hospital encounter of 07/21/21  Culture, blood (Routine X 2) w Reflex to ID Panel     Status: None   Collection Time: 07/22/21  2:44 PM   Specimen: Right Antecubital; Blood  Result Value Ref Range Status   Specimen Description RIGHT ANTECUBITAL  Final   Special Requests   Final    BOTTLES DRAWN AEROBIC AND ANAEROBIC Blood Culture adequate volume   Culture   Final    NO GROWTH 5 DAYS Performed at [facility], [address] [place], [place] [address]  Report Status 07/27/2021 FINAL  Final  Culture, blood (Routine X 2) w Reflex to ID Panel     Status: None   Collection Time: 07/22/21  2:46 PM   Specimen: BLOOD RIGHT HAND  Result Value Ref Range Status   Specimen Description BLOOD RIGHT HAND  Final   Special Requests   Final    BOTTLES DRAWN AEROBIC AND ANAEROBIC Blood Culture adequate volume   Culture   Final    NO GROWTH 5 DAYS Performed at [facility], [address], [place], [place] [address]    Report Status 07/27/2021 FINAL  Final  MRSA Next Gen by PCR, Nasal     Status: None   Collection Time: 07/27/21 12:18 PM   Specimen: Nasal Mucosa; Nasal Swab  Result Value Ref Range Status   MRSA by PCR Next Gen NOT DETECTED NOT DETECTED Final    Comment: (NOTE) The  GeneXpert MRSA Assay (FDA approved for NASAL specimens only), is one component of a comprehensive MRSA colonization surveillance program. It is not intended to diagnose MRSA infection nor to guide or monitor treatment for MRSA infections. Test performance is not FDA approved in patients less than 77 years old. Performed at [facility], [address] [place], [place] [address]   Culture, blood (Routine X 2) w Reflex to ID Panel     Status: None   Collection Time: 07/27/21 12:31 PM   Specimen: BLOOD  Result Value Ref Range Status   Specimen Description BLOOD RIGHT ANTECUBITAL  Final   Special Requests   Final    BOTTLES DRAWN AEROBIC AND ANAEROBIC Blood Culture adequate volume   Culture   Final    NO GROWTH 5 DAYS Performed at [facility], [address] [place], [place] [address]    Report Status 08/01/2021 FINAL  Final  Culture, blood (Routine X 2) w Reflex to ID Panel     Status: None   Collection Time: 07/27/21 12:33 PM   Specimen: BLOOD  Result Value Ref Range Status   Specimen Description BLOOD BLOOD RIGHT HAND  Final   Special Requests   Final    BOTTLES DRAWN AEROBIC AND ANAEROBIC Blood Culture adequate volume   Culture   Final    NO GROWTH 5 DAYS Performed at [facility], [address] [place], [place] [address]    Report Status 08/01/2021 FINAL  Final  Urine Culture     Status: None   Collection Time: 07/28/21  5:46 PM   Specimen: Urine, Clean Catch  Result Value Ref Range Status   Specimen Description   Final    URINE, CLEAN CATCH Performed at [facility], [address], [place], [place] [address]    Special Requests   Final    NONE Performed at [facility], [address], [place], [place] [address]    Culture   Final    NO GROWTH Performed at [facility], [place] [address] [place], [place] [address]    Report Status 07/30/2021 FINAL  Final  Aerobic Culture w Gram Stain (superficial specimen)      Status: None (Preliminary result)   Collection Time: 07/30/21  6:26 PM   Specimen: Wound  Result Value Ref Range Status   Specimen Description   Final    WOUND Performed at [facility], [address], [place], [place] [address]    Special Requests   Final    THIGH Performed at [facility], [phone] [place], [place] [address]    Gram Stain   Final    FEW WBC PRESENT, PREDOMINANTLY PMN FEW SQUAMOUS EPITHELIAL CELLS PRESENT NO ORGANISMS SEEN    Culture   Final    NO GROWTH < 24 HOURS Performed at [facility] [address], [place], [place] [address]    Report Status PENDING  Incomplete  Expectorated Sputum Assessment w Gram Stain, Rflx to Resp Cult     Status: None   Collection Time: 07/31/21  4:18 PM   Specimen: Sputum  Result Value Ref Range Status   Specimen Description SPUTUM  Final   Special Requests NONE  Final   Sputum evaluation   Final    THIS SPECIMEN IS ACCEPTABLE FOR SPUTUM CULTURE Performed at [facility], [address] [place], [place] [address]    Report Status 07/31/2021 FINAL  Final  Culture, Respiratory w Gram Stain     Status: None (Preliminary result)   Collection Time: 07/31/21  4:18 PM   Specimen: SPU  Result Value Ref Range Status   Specimen Description   Final    SPUTUM Performed at [facility], [address], [place], [place] [address]    Special Requests   Final    NONE Reflexed from [id number] Performed at [facility], [address] [place], [place] [address]    Gram Stain   Final    NO SQUAMOUS EPITHELIAL CELLS SEEN RARE WBC SEEN RARE GRAM POSITIVE RODS Performed at [facility], [place] [address] [place], [place] [address]    Culture PENDING  Incomplete   Report Status PENDING  Incomplete    Coagulation Studies: No results for input(s): LABPROT, INR in the last 72 hours.  Urinalysis: No results for input(s): COLORURINE, LABSPEC, PHURINE, GLUCOSEU, HGBUR,  BILIRUBINUR, KETONESUR, PROTEINUR, UROBILINOGEN, NITRITE, LEUKOCYTESUR in the last 72 hours.  Invalid input(s): APPERANCEUR     Imaging: No results found.   Medications:    meropenem (MERREM) IV Stopped (07/31/21 2212)    amitriptyline  25 mg Oral QHS   amLODipine  5 mg Oral Daily   atorvastatin  40 mg Oral q1800   carvedilol  6.25 mg Oral BID   Chlorhexidine Gluconate Cloth  6 each Topical Q0600   citalopram  20 mg Oral Daily   DULoxetine  60 mg Oral Daily   epoetin (EPOGEN/PROCRIT) injection  10,000 Units Intravenous Q M,W,F-HD   insulin aspart  0-9 Units Subcutaneous TID WC   insulin aspart  6 Units Subcutaneous TID WC   insulin glargine-yfgn  15 Units Subcutaneous BID   pantoprazole  40 mg Oral Daily   sevelamer carbonate  1,600 mg Oral TID WC   cyclobenzaprine, HYDROmorphone (DILAUDID) injection, nicotine, ondansetron **OR** ondansetron (ZOFRAN) IV, oxyCODONE-acetaminophen, polyethylene glycol  Assessment/ Plan:  [name] is a 56 y.o.  female with past medical history including hyperlipidemia, hypertension, left BKA, diabetes, chronic pain syndrome, and end-stage renal disease on hemodialysis, who was admitted to [facility] on 07/21/2021 for Acidosis [E87.20] Hyperkalemia [E87.5] Elevated BUN [R79.9] ESRD (end stage renal disease) on dialysis ([place]) [N18.6, Z99.2] Femur fracture, left (HCC) [S72.92XA] Closed fracture of left femur, unspecified fracture morphology, unspecified portion of femur, initial encounter ([place]) [S72.92XA] Closed fracture of left distal femur ([place]) [S72.402A]  [facility]/MWF/left aVF/83 kg  End-stage renal disease on dialysis.  Will maintain outpatient schedule if possible.    -Dialysis received yesterday, UF 1 L achieved.  Next hemodialysis planned  for Friday Dialysis coordinator currently seeking outpatient dialysis clinic due to patient's rehab needs.  Search remains in progress  2. Anemia of chronic kidney disease Normocytic Lab  Results  Component Value Date   HGB 7.7 (L) 08/01/2021   Hemoglobin below target.  Continue EPO with treatments.  3. Secondary Hyperparathyroidism:  Lab Results  Component Value Date   PTH 44 01/11/2020   PTH Comment 01/11/2020   CALCIUM 8.5 (L) 08/01/2021   CAION 1.10 (L) 04/07/2017   PHOS 5.1 (H) 02/20/2020   Calcium and phosphorus within acceptable range.  Continue sevelamer ordered with meals  4.  Hypertension with chronic kidney disease.  Home regimen includes amlodipine, carvedilol and hydralazine.  Currently receiving these medications.  Monitor blood pressure closely.  5. Diabetes mellitus type II with chronic kidney disease: insulin dependent. Home regimen includes to send NovoLog 70/30. Most recent hemoglobin A1c is 7.1%  on 07/22/21.          -Primary team to manage SSI  6.  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 -Evaluated by orthopedics, too complex for surgery. -Currently on meropenem for healthcare associated pneumonia -Chronic antibiotic suppression recommended by orthopedics.   LOS: [place] 6/1/202310:36 AM

## 2021-08-01 NOTE — Care Management Important Message (Signed)
Important Message  Patient Details  Name: [name] MRN: [id number] Date of Birth: 06-02-1965   Medicare Important Message Given:  Yes     Juliann Pulse A Heidi Lemay 08/01/2021, 11:05 AM

## 2021-08-01 NOTE — Progress Notes (Signed)
Progress Note   Patient: [name] AVW:[id number] DOB: 28-Dec-1965 DOA: 07/21/2021     10 DOS: the patient was seen and examined on 08/01/2021   Brief hospital course: [name] is a 56 year old female with history of hypertension, hyperlipidemia, end-stage renal disease on HD MWF, history of left BKA in 2021, depression, anxiety, insulin-dependent diabetes mellitus, insomnia, chronic pain syndrome, who presents emergency department for chief concerns of pain from falling during transfer from wheelchair to recliner about 1 week ago. She missed dialysis for whole week.  Upon arriving the hospital, she was found to have a distal left femur fracture, deemed no surgery by orthopedics.  But no weightbearing.  Patient is also started on dialysis.   Was waiting on patient to decide on SNF facility for rehab since she was initially advised inpatient rehab but later stated she did not think she would be able to participate in intensive therapy. Pending dialysis spot but SNF placement was confirmed. Discharge was not considered however as WBC had been trending up, workup showed multifocal pneumonia on CXR despite no significant symptoms, started abx 07/27/21, obtained UA for completeness (still makes some urine and this was obtained 05/28 and abnormal), Urine Cx, sputum Cx, blood Cx are all negative.  Also 05/28 drained superficial abscess L hip and sent for culture which was also negative.  d/w ortho 05/29 and obtaining XR and MRI to eval for L hip septic arthritis. 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 05/30 Ortho recommended I&D and hardware removal and place abx beads but pt does not want to proceed w/ surgery despite extensive discussion risk/benefit. ID consulted.   5/31: Had a long discussion with Ortho.  Patient will be very high risk for any surgical intervention with concern of nonhealing wound and  worsening of her underlying comorbidities.  Most likely will have infection recurrent.  They also does not want any incision and drainage as requested by ID to clean the pockets stating that this infection will soon recur as she will need a very extensive debridement. Patient initially received cefepime which was later switched with meropenem based on her prior history of resistant Enterobacter and bacteroids. This is a very unfortunate situation, no definitive solution at this time.  6/1: Patient continued to experience significant pain in left leg.  She was to see her on orthopedic surgeon regarding her new fracture and continuation of infection.   Assessment and Plan: * Femur fracture, left [facility])  Orthopedic service recommends no surgery   Orthopedic service recommends brace in flexion of the left knee, nonweightbearing  Patient to follow-up outpatient with orthopedic provider in 1 week after discharge  rehab/SNF placement   Septic arthritis of hip ([place]) Superficial abscess described as recurrent since previous surgery, drained at bedside 05/28 and obtained XR and MRI - concerning for 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   05/30 Ortho recommended I&D and hardware removal and place abx beads but pt does not want to proceed w/ surgery despite extensive discussion risk/benefit.   ID consulted 05/30  Cultures from L hip abscess obtained 05/28 remain negative.  ID switched antibiotics to meropenem based on prior culture results where she grew resistant Enterobacter and bacteroids.  Another discussion with orthopedic and they are not going to perform any procedure as risk is very more than benefit.  Patient also does not want any more procedures.  -  Continue with meropenem, might need long-term suppressive antibiotics on discharge, appreciate ID help    Skin abscess L hip Drained copious purulent fluid, sent for  wound culture and it remains negative. -Continue with wound care  HCAP (healthcare-associated pneumonia) WBC trending up, w/u reveals pneumonia on CXR despite no significant symptoms though pt does have smoker's cough, no SOB. Poorly controlled diabetes and pt's lack of effort w/ mobilization/inceptive spirometry likely contributing.   Not much upper respiratory symptoms and she completed the required course for presumed pneumonia with cefepime and Zithromax  UTI (urinary tract infection) UA able to be collected 07/28/21, and remain negative  Patient received antibiotics for concern of pneumonia and left septic hip  Type 2 diabetes mellitus with renal complication (HCC) CBG remained elevated. -Increase Semglee to 15 units twice daily. -Increase mealtime coverage to 6 units -Continue with renal SSI  ESRD (end stage renal disease) on dialysis [facility])  Nephrology following.  Patient need a new confirmed seat before discharge to rehab  TOBACCO DEPENDENCE - Nicotine patch as needed for nicotine craving   Anxiety and depression  Amitriptyline 25 mg nightly, Celexa 20 mg daily, duloxetine 60 mg daily  Essential hypertension  Amlodipine 5 mg daily, carvedilol 6.25 mg p.o. twice daily  Hydralazine 5 mg IV every 6 hours.  For SBP greater than 180,   Hyponatremia Corrected sodium at 132.  Most likely pseudohyponatremia secondary to hyperglycemia. -Continue to monitor  Hyperkalemia Resolved  Monitor bmp  Leukocytosis CXR (+) multifocal pneumonia, UA abnormal, also w/u for septic arthritis   Essential hypertension, benign-resolved as of 07/27/2021 - Patient takes amlodipine 5 mg daily, carvedilol 6.25 mg p.o. twice daily, these have been resumed for 07/22/2021 due to low normotensive in setting of requiring hemodialysis - Hydralazine 100 mg 3 times daily have not been resumed due to the same     Subjective: Patient was having significant left leg pain when seen during morning  rounds.  She also wants to discuss her case with her own orthopedic surgeon in [place].  Physical Exam: Vitals:   08/01/21 0552 08/01/21 0729 08/01/21 0800 08/01/21 1140  BP: (!) 141/58 (!) 147/55 (!) 140/50 139/62  Pulse: 67 66 63 67  Resp: 20 15  17   Temp: 98 F (36.7 C) 98.4 F (36.9 C) 98.8 F (37.1 C) 98.7 F (37.1 C)  TempSrc:   Oral   SpO2: 98% 99% 99% 100%  Weight:      Height:       General.     In no acute distress. Pulmonary.  Lungs clear bilaterally, normal respiratory effort. CV.  Regular rate and rhythm, no JVD, rub or murmur. Abdomen.  Soft, nontender, nondistended, BS positive. CNS.  Alert and oriented .  No focal neurologic deficit. Extremities.  Left BKA, knee brace and a clean bandage on lateral hip Psychiatry.  Judgment and insight appears normal.  Data Reviewed: Prior notes and labs reviewed  Family Communication:   Disposition: Status is: Inpatient Remains inpatient appropriate because: Waiting for SNF placement   Planned Discharge Destination: Skilled nursing facility  Time spent: 40 minutes  This record has been created using Systems analyst. Errors have been sought and corrected,but may not always be located. Such creation errors do not reflect on the standard of care.  Author: Lorella Nimrod, MD 08/01/2021 2:38 PM  For on call review [email or web address].

## 2021-08-02 ENCOUNTER — Inpatient Hospital Stay: Payer: Medicare Other

## 2021-08-02 DIAGNOSIS — N186 End stage renal disease: Secondary | ICD-10-CM | POA: Diagnosis not present

## 2021-08-02 DIAGNOSIS — S7292XA Unspecified fracture of left femur, initial encounter for closed fracture: Secondary | ICD-10-CM | POA: Diagnosis not present

## 2021-08-02 DIAGNOSIS — Z992 Dependence on renal dialysis: Secondary | ICD-10-CM | POA: Diagnosis not present

## 2021-08-02 DIAGNOSIS — M86452 Chronic osteomyelitis with draining sinus, left femur: Secondary | ICD-10-CM | POA: Diagnosis not present

## 2021-08-02 LAB — GLUCOSE, CAPILLARY
Glucose-Capillary: 314 mg/dL — ABNORMAL HIGH (ref 70–99)
Glucose-Capillary: 128 mg/dL — ABNORMAL HIGH (ref 70–99)
Glucose-Capillary: 187 mg/dL — ABNORMAL HIGH (ref 70–99)
Glucose-Capillary: 239 mg/dL — ABNORMAL HIGH (ref 70–99)

## 2021-08-02 LAB — C-REACTIVE PROTEIN: CRP: 4.9 mg/dL — ABNORMAL HIGH (ref <1.0)

## 2021-08-02 LAB — AEROBIC CULTURE W GRAM STAIN (SUPERFICIAL SPECIMEN): Culture: NO GROWTH

## 2021-08-02 LAB — URINALYSIS, COMPLETE (UACMP) WITH MICROSCOPIC
Bilirubin Urine: NEGATIVE
Glucose, UA: 50 mg/dL — AB
Hgb urine dipstick: NEGATIVE
Ketones, ur: NEGATIVE mg/dL
Nitrite: NEGATIVE
Protein, ur: 100 mg/dL — AB
Specific Gravity, Urine: 1.014 (ref 1.005–1.030)
pH: 5 (ref 5.0–8.0)

## 2021-08-02 LAB — SEDIMENTATION RATE: Sed Rate: 86 mm/hr — ABNORMAL HIGH (ref 0–30)

## 2021-08-02 LAB — POTASSIUM: Potassium: 3.1 mmol/L — ABNORMAL LOW (ref 3.5–5.1)

## 2021-08-02 MED ORDER — INSULIN ASPART 100 UNIT/ML IJ SOLN
0.0000 [IU] | Freq: Three times a day (TID) | INTRAMUSCULAR | Status: DC
  Administered 2021-08-02: 2 [IU] via SUBCUTANEOUS
  Administered 2021-08-02: 11 [IU] via SUBCUTANEOUS
  Administered 2021-08-02: 3 [IU] via SUBCUTANEOUS
  Administered 2021-08-03: 2 [IU] via SUBCUTANEOUS
  Administered 2021-08-03: 5 [IU] via SUBCUTANEOUS
  Administered 2021-08-03: 3 [IU] via SUBCUTANEOUS
  Administered 2021-08-04: 2 [IU] via SUBCUTANEOUS
  Administered 2021-08-04: 3 [IU] via SUBCUTANEOUS
  Administered 2021-08-05: 2 [IU] via SUBCUTANEOUS
  Filled 2021-08-02 (×9): qty 1

## 2021-08-02 MED ORDER — PENTAFLUOROPROP-TETRAFLUOROETH EX AERO
INHALATION_SPRAY | CUTANEOUS | Status: AC
  Filled 2021-08-02: qty 30

## 2021-08-02 MED ORDER — EPOETIN ALFA 10000 UNIT/ML IJ SOLN
INTRAMUSCULAR | Status: AC
  Filled 2021-08-02: qty 1

## 2021-08-02 MED ORDER — INSULIN GLARGINE-YFGN 100 UNIT/ML ~~LOC~~ SOLN
20.0000 [IU] | Freq: Two times a day (BID) | SUBCUTANEOUS | Status: DC
  Administered 2021-08-02 – 2021-08-04 (×5): 20 [IU] via SUBCUTANEOUS
  Filled 2021-08-02 (×7): qty 0.2

## 2021-08-02 MED ORDER — INSULIN ASPART 100 UNIT/ML IJ SOLN
0.0000 [IU] | Freq: Every day | INTRAMUSCULAR | Status: DC
  Administered 2021-08-02: 2 [IU] via SUBCUTANEOUS
  Filled 2021-08-02: qty 1

## 2021-08-02 NOTE — Progress Notes (Signed)
Progress Note   Patient: [name] HQI:[id number] DOB: 29-Aug-1965 DOA: 07/21/2021     11 DOS: the patient was seen and examined on 08/02/2021   Brief hospital course: Ms. Aubry Rankin is a 56 year old female with history of hypertension, hyperlipidemia, end-stage renal disease on HD MWF, history of left BKA in 2021, depression, anxiety, insulin-dependent diabetes mellitus, insomnia, chronic pain syndrome, who presents emergency department for chief concerns of pain from falling during transfer from wheelchair to recliner about 1 week ago. She missed dialysis for whole week.  Upon arriving the hospital, she was found to have a distal left femur fracture, deemed no surgery by orthopedics.  But no weightbearing.  Patient is also started on dialysis.   Was waiting on patient to decide on SNF facility for rehab since she was initially advised inpatient rehab but later stated she did not think she would be able to participate in intensive therapy. Pending dialysis spot but SNF placement was confirmed. Discharge was not considered however as WBC had been trending up, workup showed multifocal pneumonia on CXR despite no significant symptoms, started abx 07/27/21, obtained UA for completeness (still makes some urine and this was obtained 05/28 and abnormal), Urine Cx, sputum Cx, blood Cx are all negative.  Also 05/28 drained superficial abscess L hip and sent for culture which was also negative.  d/w ortho 05/29 and obtaining XR and MRI to eval for L hip septic arthritis. 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 05/30 Ortho recommended I&D and hardware removal and place abx beads but pt does not want to proceed w/ surgery despite extensive discussion risk/benefit. ID consulted.   5/31: Had a long discussion with Ortho.  Patient will be very high risk for any surgical intervention with concern of nonhealing wound and  worsening of her underlying comorbidities.  Most likely will have infection recurrent.  They also does not want any incision and drainage as requested by ID to clean the pockets stating that this infection will soon recur as she will need a very extensive debridement. Patient initially received cefepime which was later switched with meropenem based on her prior history of resistant Enterobacter and bacteroids. This is a very unfortunate situation, no definitive solution at this time.  6/1: Patient continued to experience significant pain in left leg.  She was to see her on orthopedic surgeon regarding her new fracture and continuation of infection.  6/2: Patient will be going for percutaneous abscess drainage with IR today. Nursing concern of dark-colored urine-UA ordered to rule out hematuria.  Most likely concentrated urine as patient is ESRD. CBG remained elevated-increase the dose of seemegle and switch her to moderate SSI Patient now had outpatient chair for dialysis, will be discharged once cleared from infectious disease.   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   05/30 Ortho recommended I&D and hardware removal and place abx beads but pt does not want to proceed w/ surgery despite extensive discussion risk/benefit.   ID consulted  05/30  Cultures from L hip abscess obtained 05/28 remain negative.  ID switched antibiotics to meropenem based on prior culture results where she  grew resistant Enterobacter and bacteroids.  Another discussion with orthopedic and they are not going to perform any procedure as risk is very more than benefit.  Patient also does not want any more procedures.  -Continue with meropenem, might need long-term suppressive antibiotics on discharge, appreciate ID help  Going for percutaneous drainage by IR today  Follow-up cultures  Skin abscess L hip Drained copious purulent fluid, sent for wound culture and it remains negative. -Continue with wound care  HCAP (healthcare-associated pneumonia) WBC trending up, w/u reveals pneumonia on CXR despite no significant symptoms though pt does have smoker's cough, no SOB. Poorly controlled diabetes and pt's lack of effort w/ mobilization/inceptive spirometry likely contributing.   Not much upper respiratory symptoms and she completed the required course for presumed pneumonia with cefepime and Zithromax  UTI (urinary tract infection) UA able to be collected 07/28/21, and remain negative  Patient received antibiotics for concern of pneumonia and left septic hip  Type 2 diabetes mellitus with renal complication (HCC) CBG remained elevated. -Increase Semglee to 20 units twice daily. -Increase mealtime coverage to 6 units -Change sliding scale to moderate  ESRD (end stage renal disease) on dialysis [facility])  Nephrology following.  Patient need a new confirmed seat before discharge to rehab  TOBACCO DEPENDENCE - Nicotine patch as needed for nicotine craving   Anxiety and depression  Amitriptyline 25 mg nightly, Celexa 20 mg daily, duloxetine 60 mg daily  Essential hypertension  Amlodipine 5 mg daily, carvedilol 6.25 mg p.o. twice daily  Hydralazine 5 mg IV every 6 hours.  For SBP greater than 180,   Hyponatremia Corrected sodium at 132.  Most likely pseudohyponatremia secondary to hyperglycemia. -Continue to monitor  Hyperkalemia Resolved  Monitor bmp  Leukocytosis CXR (+)  multifocal pneumonia, UA abnormal, also w/u for septic arthritis   Essential hypertension, benign-resolved as of 07/27/2021 - Patient takes amlodipine 5 mg daily, carvedilol 6.25 mg p.o. twice daily, these have been resumed for 07/22/2021 due to low normotensive in setting of requiring hemodialysis - Hydralazine 100 mg 3 times daily have not been resumed due to the same     Subjective: Patient was seen during dialysis today.  Continues to have left lower thigh pain around her new fracture site.  Significant increase in purulent discharge today.  Physical Exam: Vitals:   08/02/21 1145 08/02/21 1200 08/02/21 1215 08/02/21 1227  BP: (!) 139/54 (!) 136/57 (!) 133/52   Pulse: 63 65 66 67  Resp: 12 13 12 13   Temp:      TempSrc:      SpO2: 97%     Weight:      Height:       General.     In no acute distress. Pulmonary.  Lungs clear bilaterally, normal respiratory effort. CV.  Regular rate and rhythm, no JVD, rub or murmur. Abdomen.  Soft, nontender, nondistended, BS positive. CNS.  Alert and oriented .  No focal neurologic deficit. Extremities.  No edema, left BKA, left knee with splint and left hip with soiled bandage with purulent discharge. Psychiatry.  Judgment and insight appears normal.  Data Reviewed: Prior notes, labs and images reviewed  Family Communication: Discussed with patient  Disposition: Status is: Inpatient Remains inpatient appropriate because: Severity of illness   Planned Discharge Destination: Skilled nursing facility  Time spent: 45 minutes  This record  has been created using Systems analyst. Errors have been sought and corrected,but may not always be located. Such creation errors do not reflect on the standard of care.  Author: Lorella Nimrod, MD 08/02/2021 12:44 PM  For on call review [email or web address].

## 2021-08-02 NOTE — Progress Notes (Signed)
Physical Therapy Treatment Patient Details Name: [name] MRN: [id number] DOB: 17-Jan-1966 Today's Date: 08/02/2021   History of Present Illness Pt is a 55 y/o F admitted on 07/21/21 after presenting to ED for c/c of pain from falling during transfer from w/c to recliner ~1 week ago & pt missed dialysis for a whole week. Pt found to ahve distal L femur fx & deemed no surgery by orthopedics but NWB LLE. PMH: HTN, HLD, ESRD on HD MWF, L BKA in 2021, depression, anxiety, insulin-dependent DM, insomnia, chronic pain syndromes    PT Comments    Pt was seen my PT prior to HD. Noted elevated blood sugars however pt cleared to participate. Pt continues to be self limiting and pain limited. She required max encouragement to participate and Pryor Curia questions pt's effort throughout. She eventually agrees to participate and was able to stand a few times EOB with a lot of assistance. MD/care team messaged about concerns post session. Highly recommend DC to rehab to maximize independence with ADLs.     Recommendations for follow up therapy are one component of a multi-disciplinary discharge planning process, led by the attending physician.  Recommendations may be updated based on patient status, additional functional criteria and insurance authorization.  Follow Up Recommendations  Skilled nursing-short term rehab (<3 hours/day)     Assistance Recommended at Discharge Frequent or constant Supervision/Assistance  Patient can return home with the following A lot of help with walking and/or transfers;A lot of help with bathing/dressing/bathroom;Assistance with cooking/housework;Direct supervision/assist for medications management;Direct supervision/assist for financial management;Assist for transportation;Help with stairs or ramp for entrance   Equipment Recommendations  None recommended by PT       Precautions / Restrictions Precautions Precautions: Fall Restrictions Weight Bearing Restrictions:  Yes LLE Weight Bearing: Non weight bearing Other Position/Activity Restrictions: pt also with chronic L BKA     Mobility  Bed Mobility Overal bed mobility: Needs Assistance Bed Mobility: Rolling, Sidelying to Sit, Supine to Sit, Sit to Supine Rolling: Mod assist Sidelying to sit: Max assist Supine to sit: Max assist Sit to supine: Max assist        Transfers Overall transfer level: Needs assistance Equipment used: Rolling walker (2 wheels) Transfers: Sit to/from Stand Sit to Stand: Mod assist, From elevated surface           General transfer comment: pt stood 2 x from elevated EOB surface with mod assist + max vcs and encouragement. Author protected R knee from buckling    Ambulation/Gait     General Gait Details: non ambulatory at baseline   Balance Overall balance assessment: Needs assistance Sitting-balance support: Bilateral upper extremity supported, Feet supported Sitting balance-Leahy Scale: Good     Standing balance support: Bilateral upper extremity supported, During functional activity, Reliant on assistive device for balance Standing balance-Leahy Scale: Poor Standing balance comment: high fall risk due to limited ability to tolerate standing       Cognition Arousal/Alertness: Awake/alert Behavior During Therapy: WFL for tasks assessed/performed Overall Cognitive Status: Within Functional Limits for tasks assessed      General Comments: Pt is A and O x4. needs alot of encouragement to participate           General Comments General comments (skin integrity, edema, etc.): Pt has dark urine, alot of L hip drainage, and poor pain control. She has been extremely self limiting throughout hospitalization. Knee splinted in slight flexion. Author continues to encourage more physical activity form patient to improve independence  Pertinent Vitals/Pain Pain Assessment Pain Assessment: 0-10 Pain Score: 9  Faces Pain Scale: Hurts whole lot Pain  Location: L LE Pain Descriptors / Indicators: Discomfort, Aching Pain Intervention(s): Limited activity within patient's tolerance, Monitored during session, Premedicated before session, Repositioned     PT Goals (current goals can now be found in the care plan section) Acute Rehab PT Goals Patient Stated Goal: less pain with movement Progress towards PT goals: Progressing toward goals    Frequency    7X/week      PT Plan Current plan remains appropriate       AM-PAC PT "6 Clicks" Mobility   Outcome Measure  Help needed turning from your back to your side while in a flat bed without using bedrails?: A Lot Help needed moving from lying on your back to sitting on the side of a flat bed without using bedrails?: A Lot Help needed moving to and from a bed to a chair (including a wheelchair)?: A Lot Help needed standing up from a chair using your arms (e.g., wheelchair or bedside chair)?: A Lot Help needed to walk in hospital room?: Total Help needed climbing 3-5 steps with a railing? : Total 6 Click Score: 10    End of Session   Activity Tolerance: Patient limited by pain;Patient tolerated treatment well Patient left: in bed;with call bell/phone within reach;with bed alarm set Nurse Communication: Mobility status PT Visit Diagnosis: Difficulty in walking, not elsewhere classified (R26.2);Other abnormalities of gait and mobility (R26.89);Muscle weakness (generalized) (M62.81);Other (comment);Pain Pain - Right/Left: Left Pain - part of body: Leg     Time: 0300-9233 PT Time Calculation (min) (ACUTE ONLY): 28 min  Charges:  $Therapeutic Activity: 23-37 mins                     Julaine Fusi PTA 08/02/21, 8:33 AM

## 2021-08-02 NOTE — Plan of Care (Signed)

## 2021-08-02 NOTE — Assessment & Plan Note (Signed)
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   05/30 Ortho recommended I&D and hardware removal and place abx beads but pt does not want to proceed w/ surgery despite extensive discussion risk/benefit.   ID consulted 05/30  Cultures from L hip abscess obtained 05/28 remain negative.  ID switched antibiotics to meropenem based on prior culture results where she grew resistant Enterobacter and bacteroids.  Another discussion with orthopedic and they are not going to perform any procedure as risk is very more than benefit.  Patient also does not want any more procedures.  -Continue with meropenem, might need long-term suppressive antibiotics on discharge, appreciate ID help  Going for percutaneous drainage by IR today  Follow-up cultures

## 2021-08-02 NOTE — Progress Notes (Signed)
[facility]  ROUNDING NOTE   Subjective:   Patient seen and evaluated during dialysis   HEMODIALYSIS FLOWSHEET:  Blood Flow Rate (mL/min): 400 mL/min Arterial Pressure (mmHg): -180 mmHg Venous Pressure (mmHg): 160 mmHg Transmembrane Pressure (mmHg): 50 mmHg Ultrafiltration Rate (mL/min): 500 mL/min Dialysate Flow Rate (mL/min): 500 ml/min Conductivity: 13.9 Conductivity: 13.9 Dialysis Fluid Bolus: Normal Saline Bolus Amount (mL): 250 mL DO NOT USE: Dialysate Change: Other (comment) (bath changed to 3K)  No complaints at at this time Pain appropriately managed  Awaiting second opinion from orthopedics   Objective:  Vital signs in last 24 hours:  Temp:  [98 F (36.7 C)-98.7 F (37.1 C)] 98 F (36.7 C) (06/02 0915) Pulse Rate:  [63-72] 67 (06/02 1227) Resp:  [11-16] 13 (06/02 1227) BP: (117-150)/(45-58) 133/52 (06/02 1215) SpO2:  [95 %-100 %] 97 % (06/02 1145) Weight:  [91 kg] 91 kg (06/02 0915)  Weight change:  Filed Weights   07/31/21 0909 07/31/21 1218 08/02/21 0915  Weight: 89 kg 87.7 kg 91 kg    Intake/Output: I/O last 3 completed shifts: In: 100 [IV Piggyback:100] Out: -    Intake/Output this shift:  Total I/O In: 100 [IV Piggyback:100] Out: 1000 [Other:1000]  Physical Exam: General: NAD  Head: Normocephalic, atraumatic. Moist oral mucosal membranes  Eyes: Anicteric  Lungs:  Clear to auscultation,normal effort  Heart: Regular rate and rhythm  Abdomen:  Soft, nontender  Extremities:  No peripheral edema. Lt BKA  Neurologic: Alert, able to answer all questions  Skin: No lesions  Access: Lt AVF, good thrill    Basic Metabolic Panel: Recent Labs  Lab 07/28/21 0401 07/28/21 0822 07/29/21 0420 07/30/21 0345 07/31/21 0329 08/01/21 0333  NA 128*  --  128* 132* 129* 132*  K 4.3  --  5.0 4.0 3.9 3.6  CL 92*  --  96* 97* 94* 97*  CO2 26  --  24 28 26 28   GLUCOSE 437* 443* 314* 372* 316* 381*  BUN 74*  --  89* 42* 58* 33*   CREATININE 7.87*  --  9.03* 5.24* 6.73* 4.64*  CALCIUM 8.7*  --  8.9 8.9 8.7* 8.5*     Liver Function Tests: No results for input(s): AST, ALT, ALKPHOS, BILITOT, PROT, ALBUMIN in the last 168 hours.  No results for input(s): LIPASE, AMYLASE in the last 168 hours. No results for input(s): AMMONIA in the last 168 hours.  CBC: Recent Labs  Lab 07/27/21 0433 07/28/21 0401 07/29/21 0420 07/30/21 0345 07/31/21 0329 08/01/21 0333  WBC 25.0*  24.1* 26.9* 23.8* 17.3* 16.2* 14.1*  NEUTROABS 17.2*  --   --   --   --   --   HGB 7.4*  7.2* 6.7* 7.4* 7.7* 7.3* 7.7*  HCT 23.9*  23.7* 22.2* 23.6* 25.3* 24.0* 25.4*  MCV 94.5  93.7 95.7 93.3 94.4 94.9 96.2  PLT 351  333 350 309 299 284 277     Cardiac Enzymes: No results for input(s): CKTOTAL, CKMB, CKMBINDEX, TROPONINI in the last 168 hours.  BNP: Invalid input(s): POCBNP  CBG: Recent Labs  Lab 08/01/21 0726 08/01/21 1141 08/01/21 1553 08/01/21 2059 08/02/21 0730  GLUCAP 322* 178* 120* 165* 314*     Microbiology: Results for orders placed or performed during the hospital encounter of 07/21/21  Culture, blood (Routine X 2) w Reflex to ID Panel     Status: None   Collection Time: 07/22/21  2:44 PM   Specimen: Right Antecubital; Blood  Result Value Ref Range  Status   Specimen Description RIGHT ANTECUBITAL  Final   Special Requests   Final    BOTTLES DRAWN AEROBIC AND ANAEROBIC Blood Culture adequate volume   Culture   Final    NO GROWTH 5 DAYS Performed at [facility], [address] [place], [place] [address]    Report Status 07/27/2021 FINAL  Final  Culture, blood (Routine X 2) w Reflex to ID Panel     Status: None   Collection Time: 07/22/21  2:46 PM   Specimen: BLOOD RIGHT HAND  Result Value Ref Range Status   Specimen Description BLOOD RIGHT HAND  Final   Special Requests   Final    BOTTLES DRAWN AEROBIC AND ANAEROBIC Blood Culture adequate volume   Culture   Final    NO GROWTH 5  DAYS Performed at [facility], [address] [place], [place] [address]    Report Status 07/27/2021 FINAL  Final  MRSA Next Gen by PCR, Nasal     Status: None   Collection Time: 07/27/21 12:18 PM   Specimen: Nasal Mucosa; Nasal Swab  Result Value Ref Range Status   MRSA by PCR Next Gen NOT DETECTED NOT DETECTED Final    Comment: (NOTE) The GeneXpert MRSA Assay (FDA approved for NASAL specimens only), is one component of a comprehensive MRSA colonization surveillance program. It is not intended to diagnose MRSA infection nor to guide or monitor treatment for MRSA infections. Test performance is not FDA approved in patients less than 57 years old. Performed at [facility], [address] [place], [place] [address]   Culture, blood (Routine X 2) w Reflex to ID Panel     Status: None   Collection Time: 07/27/21 12:31 PM   Specimen: BLOOD  Result Value Ref Range Status   Specimen Description BLOOD RIGHT ANTECUBITAL  Final   Special Requests   Final    BOTTLES DRAWN AEROBIC AND ANAEROBIC Blood Culture adequate volume   Culture   Final    NO GROWTH 5 DAYS Performed at [facility], [address], [place], [place] [address]    Report Status 08/01/2021 FINAL  Final  Culture, blood (Routine X 2) w Reflex to ID Panel     Status: None   Collection Time: 07/27/21 12:33 PM   Specimen: BLOOD  Result Value Ref Range Status   Specimen Description BLOOD BLOOD RIGHT HAND  Final   Special Requests   Final    BOTTLES DRAWN AEROBIC AND ANAEROBIC Blood Culture adequate volume   Culture   Final    NO GROWTH 5 DAYS Performed at [facility], [address], [place], [place] [address]    Report Status 08/01/2021 FINAL  Final  Urine Culture     Status: None   Collection Time: 07/28/21  5:46 PM   Specimen: Urine, Clean Catch  Result Value Ref Range Status   Specimen Description   Final    URINE, CLEAN CATCH Performed at [facility], [address] [place] [address], [place], [place] [address]    Special Requests   Final    NONE Performed at [facility], [address] [place], [place] [address]    Culture   Final    NO GROWTH Performed at [facility], [place] [address] [place],  [address]    Report Status 07/30/2021 FINAL  Final  Aerobic Culture w Gram Stain (superficial specimen)     Status: None (Preliminary result)   Collection  Time: 07/30/21  6:26 PM   Specimen: Wound  Result Value Ref Range Status   Specimen Description   Final    WOUND Performed at [facility], [address] [place], [place] [address]    Special Requests   Final    THIGH Performed at [facility], [address] [place], [place] [address]    Gram Stain   Final    FEW WBC PRESENT, PREDOMINANTLY PMN FEW SQUAMOUS EPITHELIAL CELLS PRESENT NO ORGANISMS SEEN    Culture   Final    NO GROWTH 2 DAYS Performed at [facility], [place] [address], [place], [place] [address]    Report Status PENDING  Incomplete  Expectorated Sputum Assessment w Gram Stain, Rflx to Resp Cult     Status: None   Collection Time: 07/31/21  4:18 PM   Specimen: Sputum  Result Value Ref Range Status   Specimen Description SPUTUM  Final   Special Requests NONE  Final   Sputum evaluation   Final    THIS SPECIMEN IS ACCEPTABLE FOR SPUTUM CULTURE Performed at [facility], [address], [place], [place] [address]    Report Status 07/31/2021 FINAL  Final  Culture, Respiratory w Gram Stain     Status: None (Preliminary result)   Collection Time: 07/31/21  4:18 PM   Specimen: SPU  Result Value Ref Range Status   Specimen Description   Final    SPUTUM Performed at [facility], [address], [place], [place] [address]    Special Requests   Final    NONE Reflexed from [id number] Performed at [facility], [address] [place], [place] [address]    Gram Stain   Final    NO SQUAMOUS EPITHELIAL CELLS  SEEN RARE WBC SEEN RARE GRAM POSITIVE RODS    Culture   Final    NO GROWTH < 24 HOURS Performed at [facility], [place] [address], [place],  [address]    Report Status PENDING  Incomplete    Coagulation Studies: No results for input(s): LABPROT, INR in the last 72 hours.  Urinalysis: No results for input(s): COLORURINE, LABSPEC, PHURINE, GLUCOSEU, HGBUR, BILIRUBINUR, KETONESUR, PROTEINUR, UROBILINOGEN, NITRITE, LEUKOCYTESUR in the last 72 hours.  Invalid input(s): APPERANCEUR     Imaging: No results found.   Medications:    meropenem (MERREM) IV Stopped (08/01/21 2212)    amitriptyline  25 mg Oral QHS   amLODipine  5 mg Oral Daily   atorvastatin  40 mg Oral q1800   carvedilol  6.25 mg Oral BID   Chlorhexidine Gluconate Cloth  6 each Topical Q0600   citalopram  20 mg Oral Daily   DULoxetine  60 mg Oral Daily   epoetin alfa       epoetin (EPOGEN/PROCRIT) injection  10,000 Units Intravenous Q M,W,F-HD   insulin aspart  0-15 Units Subcutaneous TID WC   insulin aspart  0-5 Units Subcutaneous QHS   insulin aspart  6 Units Subcutaneous TID WC   insulin glargine-yfgn  20 Units Subcutaneous BID   pantoprazole  40 mg Oral Daily   pentafluoroprop-tetrafluoroeth       sevelamer carbonate  1,600 mg Oral TID WC   cyclobenzaprine, HYDROmorphone (DILAUDID) injection, nicotine, ondansetron **OR** ondansetron (ZOFRAN) IV, oxyCODONE-acetaminophen, polyethylene glycol  Assessment/ Plan:  [name] is a 56 y.o.  female with past medical history including hyperlipidemia, hypertension, left BKA, diabetes, chronic pain syndrome, and end-stage renal disease on  hemodialysis, who was admitted to [facility] on 07/21/2021 for Acidosis [E87.20] Hyperkalemia [E87.5] Elevated BUN [R79.9] ESRD (end stage renal disease) on dialysis ([place]) [N18.6, Z99.2] Femur fracture, left (HCC) [S72.92XA] Closed fracture of left femur, unspecified fracture morphology, unspecified portion of femur,  initial encounter ([place]) [S72.92XA] Closed fracture of left distal femur (HCC) [S72.402A]  [facility]/MWF/left aVF/83 kg  End-stage renal disease on dialysis.  Will maintain outpatient schedule if possible.    -Receiving dialysis today, UF goal 1L as tolerated. Next hemodialysis planned for Friday Appreciate dialysis coordinator confirming outpatient clinic placement at [facility] on a MWF schedule. Able to start on Monday, 1145am.   2. Anemia of chronic kidney disease Normocytic Lab Results  Component Value Date   HGB 7.7 (L) 08/01/2021   Continue EPO with treatments.  3. Secondary Hyperparathyroidism:  Lab Results  Component Value Date   PTH 44 01/11/2020   PTH Comment 01/11/2020   CALCIUM 8.5 (L) 08/01/2021   CAION 1.10 (L) 04/07/2017   PHOS 5.1 (H) 02/20/2020   Will continue to monitor bone minerals during this admission.  Continue sevelamer ordered with meals  4.  Hypertension with chronic kidney disease.  Home regimen includes amlodipine, carvedilol and hydralazine.  Currently receiving these medications.  BP 145/54 during dialysis  5. Diabetes mellitus type II with chronic kidney disease: insulin dependent. Home regimen includes to send NovoLog 70/30. Most recent hemoglobin A1c is 7.1%  on 07/22/21.          -Primary team to manage SSI  6.  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 -Evaluated by orthopedics, too complex for surgery. -Currently on meropenem for healthcare associated pneumonia -Chronic antibiotic suppression recommended by orthopedics.  -patient requesting second opinion    LOS: [place] 6/2/202312:36 PM

## 2021-08-02 NOTE — Assessment & Plan Note (Signed)
UA able to be collected 07/28/21, and remain negative  Patient received antibiotics for concern of pneumonia and left septic hip

## 2021-08-02 NOTE — Progress Notes (Signed)
OT Cancellation Note  Patient Details Name: [name] MRN: [id number] DOB: Sep 16, 1965   Cancelled Treatment:    Reason Eval/Treat Not Completed: Other (comment). Pt out of room for dialysis. Will re-attempt when pt is next available.   Darleen Crocker, MS, OTR/L , CBIS ascom [phone]  08/02/21, 12:34 PM

## 2021-08-02 NOTE — TOC Progression Note (Signed)
Transition of Care [facility]) - Progression Note    Patient Details  Name: [name] MRN: [id number] Date of Birth: 1965/12/13  Transition of Care [facility]) CM/SW [place], RN Phone Number: 08/02/2021, 10:35 AM  Clinical Narrative:    Have a chair time [facility], Will start Monday, MWF, forst time be there at 11, afterwards chair time 1145, be there 1115   Expected Discharge Plan: [place] Barriers to Discharge:  (chair time, dilaysis)  Expected Discharge Plan and Services Expected Discharge Plan: [place]   Discharge Planning Services: CM Consult   Living arrangements for the past 2 months: Single Family Home                 DME Arranged: N/A                     Social Determinants of Health (SDOH) Interventions    Readmission Risk Interventions    07/26/2021    3:15 PM  Readmission Risk Prevention Plan  Transportation Screening Complete  PCP or Specialist Appt within 3-5 Days Complete  HRI or Home Care Consult Complete  Palliative Care Screening Not Applicable  Medication Review (RN Care Manager) Referral to Pharmacy

## 2021-08-02 NOTE — Progress Notes (Signed)
Pt was brought to CT for left thigh abscess aspiration. Pt refused procedure and was transported back to her room. After speaking with patient at bedside, she states she is not here for thigh abscess but for fractured leg. She adds that she wants to talk to her orthopedic surgeon before doing any procedures. Pt adds that the thigh abscess aspiration was not clearly explained to her and she did not agree to proceed. Pt was advised that if she speaks with her orthopedic surgeon and decides she wants abscess aspiration, to let staff aware that she wishes to proceed.  Pt verbalized understanding.    Narda Rutherford, AGNP-BC 08/02/2021, 3:42 PM

## 2021-08-02 NOTE — Progress Notes (Signed)
Patient has been refusing to discuss her condition or be examined She wants to talk to her orthopedic surgeon at [facility] before she would do anything Patient is currently getting meropenem The cultures so far has been negative Would change the meropenem to vancomycin and cefepime to be given during dialysis days. Sent message to Dr. Lyla Glassing but has not been able to talk to him

## 2021-08-02 NOTE — Plan of Care (Signed)
  Problem: Education: Goal: Knowledge of General Education information will improve Description: Including pain rating scale, medication(s)/side effects and non-pharmacologic comfort measures Outcome: Progressing   Problem: Health Behavior/Discharge Planning: Goal: Ability to manage health-related needs will improve Outcome: Progressing   Problem: Clinical Measurements: Goal: Ability to maintain clinical measurements within normal limits will improve Outcome: Progressing   Problem: Clinical Measurements: Goal: Diagnostic test results will improve Outcome: Progressing   Problem: Clinical Measurements: Goal: Respiratory complications will improve Outcome: Progressing   Problem: Clinical Measurements: Goal: Cardiovascular complication will be avoided Outcome: Progressing

## 2021-08-02 NOTE — Assessment & Plan Note (Signed)
CBG remained elevated. -Increase Semglee to 20 units twice daily. -Increase mealtime coverage to 6 units -Change sliding scale to moderate

## 2021-08-02 NOTE — Progress Notes (Signed)
Hemodialysis Post Treatment Note:   Tx date: 08/01/2021  Tx time: 3 hours  Access: left AVF  UF Removed: 1L   Note:  HD completed. Tolerated well. No complications. Patient asymptomatic. Report given to floor nurse Blythe Stanford, RN.

## 2021-08-03 LAB — GLUCOSE, CAPILLARY
Glucose-Capillary: 193 mg/dL — ABNORMAL HIGH (ref 70–99)
Glucose-Capillary: 217 mg/dL — ABNORMAL HIGH (ref 70–99)
Glucose-Capillary: 128 mg/dL — ABNORMAL HIGH (ref 70–99)
Glucose-Capillary: 97 mg/dL (ref 70–99)

## 2021-08-03 LAB — CULTURE, RESPIRATORY W GRAM STAIN: Gram Stain: NONE SEEN

## 2021-08-03 NOTE — Progress Notes (Signed)
PT Cancellation Note  Patient Details Name: [name] MRN: [id number] DOB: 09-12-1965   Cancelled Treatment:     PT attempt. 2nd attempt today. " They were 1.5 hours late with my pain medicine and now I can't do it cause it hurts." Author continues to encourage pt to participate but pt requested to rest. Will continue to monitor and progress as able per current POC. Pt has been self limiting throughout admission.    Willette Pa 08/03/2021, 1:38 PM

## 2021-08-03 NOTE — Progress Notes (Signed)
[facility]  ROUNDING NOTE   Subjective:   Patient underwent hemodialysis treatment yesterday. Tolerated well. Resting comfortably in bed at the moment.   Objective:  Vital signs in last 24 hours:  Temp:  [97.7 F (36.5 C)-98.5 F (36.9 C)] 97.9 F (36.6 C) (06/03 1600) Pulse Rate:  [61-63] 63 (06/03 1600) Resp:  [15-18] 15 (06/03 1600) BP: (114-139)/(54-71) 139/56 (06/03 1600) SpO2:  [92 %-100 %] 99 % (06/03 1600)  Weight change:  Filed Weights   07/31/21 1218 08/02/21 0915 08/02/21 1244  Weight: 87.7 kg 91 kg 90.2 kg    Intake/Output: I/O last 3 completed shifts: In: 340 [P.O.:240; IV Piggyback:100] Out: 1000 [Other:1000]   Intake/Output this shift:  Total I/O In: 240 [P.O.:240] Out: -   Physical Exam: General: NAD  Head: Normocephalic, atraumatic. Moist oral mucosal membranes  Eyes: Anicteric  Lungs:  Clear to auscultation,normal effort  Heart: Regular rate and rhythm  Abdomen:  Soft, nontender  Extremities: No peripheral edema. Lt BKA  Neurologic: Alert, able to answer all questions  Skin: No lesions  Access: Lt AVF    Basic Metabolic Panel: Recent Labs  Lab 07/28/21 0401 07/28/21 1610 07/29/21 0420 07/30/21 0345 07/31/21 0329 08/01/21 0333 08/02/21 1543  NA 128*  --  128* 132* 129* 132*  --   K 4.3  --  5.0 4.0 3.9 3.6 3.1*  CL 92*  --  96* 97* 94* 97*  --   CO2 26  --  24 28 26 28   --   GLUCOSE 437* 443* 314* 372* 316* 381*  --   BUN 74*  --  89* 42* 58* 33*  --   CREATININE 7.87*  --  9.03* 5.24* 6.73* 4.64*  --   CALCIUM 8.7*  --  8.9 8.9 8.7* 8.5*  --      Liver Function Tests: No results for input(s): AST, ALT, ALKPHOS, BILITOT, PROT, ALBUMIN in the last 168 hours.  No results for input(s): LIPASE, AMYLASE in the last 168 hours. No results for input(s): AMMONIA in the last 168 hours.  CBC: Recent Labs  Lab 07/28/21 0401 07/29/21 0420 07/30/21 0345 07/31/21 0329 08/01/21 0333  WBC 26.9* 23.8* 17.3* 16.2* 14.1*   HGB 6.7* 7.4* 7.7* 7.3* 7.7*  HCT 22.2* 23.6* 25.3* 24.0* 25.4*  MCV 95.7 93.3 94.4 94.9 96.2  PLT 350 309 299 284 277     Cardiac Enzymes: No results for input(s): CKTOTAL, CKMB, CKMBINDEX, TROPONINI in the last 168 hours.  BNP: Invalid input(s): POCBNP  CBG: Recent Labs  Lab 08/02/21 1322 08/02/21 1738 08/02/21 2125 08/03/21 1006 08/03/21 1156  GLUCAP 128* 187* 239* 193* 217*     Microbiology: Results for orders placed or performed during the hospital encounter of 07/21/21  Culture, blood (Routine X 2) w Reflex to ID Panel     Status: None   Collection Time: 07/22/21  2:44 PM   Specimen: Right Antecubital; Blood  Result Value Ref Range Status   Specimen Description RIGHT ANTECUBITAL  Final   Special Requests   Final    BOTTLES DRAWN AEROBIC AND ANAEROBIC Blood Culture adequate volume   Culture   Final    NO GROWTH 5 DAYS Performed at [facility], [address], [place], [place] [address]    Report Status 07/27/2021 FINAL  Final  Culture, blood (Routine X 2) w Reflex to ID Panel     Status: None   Collection Time: 07/22/21  2:46 PM   Specimen: BLOOD RIGHT  HAND  Result Value Ref Range Status   Specimen Description BLOOD RIGHT HAND  Final   Special Requests   Final    BOTTLES DRAWN AEROBIC AND ANAEROBIC Blood Culture adequate volume   Culture   Final    NO GROWTH 5 DAYS Performed at [facility], [address], [place], [place] [address]    Report Status 07/27/2021 FINAL  Final  MRSA Next Gen by PCR, Nasal     Status: None   Collection Time: 07/27/21 12:18 PM   Specimen: Nasal Mucosa; Nasal Swab  Result Value Ref Range Status   MRSA by PCR Next Gen NOT DETECTED NOT DETECTED Final    Comment: (NOTE) The GeneXpert MRSA Assay (FDA approved for NASAL specimens only), is one component of a comprehensive MRSA colonization surveillance program. It is not intended to diagnose MRSA infection nor to guide or monitor treatment for MRSA  infections. Test performance is not FDA approved in patients less than 43 years old. Performed at [facility], [address] [place], [place] [address]   Culture, blood (Routine X 2) w Reflex to ID Panel     Status: None   Collection Time: 07/27/21 12:31 PM   Specimen: BLOOD  Result Value Ref Range Status   Specimen Description BLOOD RIGHT ANTECUBITAL  Final   Special Requests   Final    BOTTLES DRAWN AEROBIC AND ANAEROBIC Blood Culture adequate volume   Culture   Final    NO GROWTH 5 DAYS Performed at [facility], [address], [place], [place] [address]    Report Status 08/01/2021 FINAL  Final  Culture, blood (Routine X 2) w Reflex to ID Panel     Status: None   Collection Time: 07/27/21 12:33 PM   Specimen: BLOOD  Result Value Ref Range Status   Specimen Description BLOOD BLOOD RIGHT HAND  Final   Special Requests   Final    BOTTLES DRAWN AEROBIC AND ANAEROBIC Blood Culture adequate volume   Culture   Final    NO GROWTH 5 DAYS Performed at [facility], [address] [place], [place] [address]    Report Status 08/01/2021 FINAL  Final  Urine Culture     Status: None   Collection Time: 07/28/21  5:46 PM   Specimen: Urine, Clean Catch  Result Value Ref Range Status   Specimen Description   Final    URINE, CLEAN CATCH Performed at [facility], [address], [place], [place] [address]    Special Requests   Final    NONE Performed at [facility], [address], [place], [place] [address]    Culture   Final    NO GROWTH Performed at [facility], [place] [address], [place], [place] [address]    Report Status 07/30/2021 FINAL  Final  Aerobic Culture w Gram Stain (superficial specimen)     Status: None   Collection Time: 07/30/21  6:26 PM   Specimen: Wound  Result Value Ref Range Status   Specimen Description   Final    WOUND Performed at [facility], [address], [place], [place] [address]     Special Requests   Final    THIGH Performed at [facility], [address] [place], [place] [address]    Gram Stain   Final    FEW WBC PRESENT, PREDOMINANTLY PMN FEW SQUAMOUS EPITHELIAL CELLS PRESENT NO ORGANISMS SEEN    Culture   Final  NO GROWTH 3 DAYS Performed at [facility], [place] [address] [place], [place] [address]    Report Status 08/02/2021 FINAL  Final  Expectorated Sputum Assessment w Gram Stain, Rflx to Resp Cult     Status: None   Collection Time: 07/31/21  4:18 PM   Specimen: Sputum  Result Value Ref Range Status   Specimen Description SPUTUM  Final   Special Requests NONE  Final   Sputum evaluation   Final    THIS SPECIMEN IS ACCEPTABLE FOR SPUTUM CULTURE Performed at [facility], [address], [place], [place] [address]    Report Status 07/31/2021 FINAL  Final  Culture, Respiratory w Gram Stain     Status: None   Collection Time: 07/31/21  4:18 PM   Specimen: SPU  Result Value Ref Range Status   Specimen Description SPUTUM  Final   Special Requests NONE Reflexed from [id number]  Final   Gram Stain   Final    NO SQUAMOUS EPITHELIAL CELLS SEEN RARE WBC SEEN RARE GRAM POSITIVE RODS    Culture RARE CANDIDA ALBICANS  Final   Report Status 08/03/2021 FINAL  Final    Coagulation Studies: No results for input(s): LABPROT, INR in the last 72 hours.  Urinalysis: Recent Labs    08/02/21 1750  COLORURINE YELLOW*  LABSPEC 1.014  PHURINE 5.0  GLUCOSEU 50*  HGBUR NEGATIVE  BILIRUBINUR NEGATIVE  KETONESUR NEGATIVE  PROTEINUR 100*  NITRITE NEGATIVE  LEUKOCYTESUR TRACE*       Imaging: No results found.   Medications:    meropenem (MERREM) IV 500 mg (08/02/21 2117)    amitriptyline  25 mg Oral QHS   amLODipine  5 mg Oral Daily   atorvastatin  40 mg Oral q1800   carvedilol  6.25 mg Oral BID   Chlorhexidine Gluconate Cloth  6 each Topical Q0600   citalopram  20 mg Oral Daily   DULoxetine  60 mg Oral Daily    epoetin (EPOGEN/PROCRIT) injection  10,000 Units Intravenous Q M,W,F-HD   insulin aspart  0-15 Units Subcutaneous TID WC   insulin aspart  0-5 Units Subcutaneous QHS   insulin aspart  6 Units Subcutaneous TID WC   insulin glargine-yfgn  20 Units Subcutaneous BID   pantoprazole  40 mg Oral Daily   sevelamer carbonate  1,600 mg Oral TID WC   cyclobenzaprine, HYDROmorphone (DILAUDID) injection, nicotine, ondansetron **OR** ondansetron (ZOFRAN) IV, oxyCODONE-acetaminophen, polyethylene glycol  Assessment/ Plan:  [name] is a 56 y.o.  female with past medical history including hyperlipidemia, hypertension, left BKA, diabetes, chronic pain syndrome, and end-stage renal disease on hemodialysis, who was admitted to [facility] on 07/21/2021 for Acidosis [E87.20] Hyperkalemia [E87.5] Elevated BUN [R79.9] ESRD (end stage renal disease) on dialysis ([place]) [N18.6, Z99.2] Femur fracture, left (HCC) [S72.92XA] Closed fracture of left femur, unspecified fracture morphology, unspecified portion of femur, initial encounter ([place]) [S72.92XA] Closed fracture of left distal femur ([place]) [S72.402A]  [facility]/MWF/left aVF/83 kg  End-stage renal disease on dialysis.  Will maintain outpatient schedule if possible.    -Patient underwent hemodialysis treatment yesterday.  Tolerated well.  Patient to dialyze at [facility] as an outpatient.  2. Anemia of chronic kidney disease Normocytic Lab Results  Component Value Date   HGB 7.7 (L) 08/01/2021   Hemoglobin still below goal at 7.7.  Continue Epogen for now but will likely need Mircera as an outpatient.  3. Secondary Hyperparathyroidism:  Lab Results  Component Value Date  PTH 44 01/11/2020   PTH Comment 01/11/2020   CALCIUM 8.5 (L) 08/01/2021   CAION 1.10 (L) 04/07/2017   PHOS 5.1 (H) 02/20/2020   Maintain the patient on Renvela.  Continue to monitor bone metabolism parameters.  4.  Hypertension with chronic kidney disease.   Home regimen includes amlodipine, carvedilol and hydralazine.  Currently receiving these medications.  Continue to monitor blood pressure during dialysis treatments.  5. Diabetes mellitus type II with chronic kidney disease: insulin dependent. Home regimen includes to send NovoLog 70/30. Most recent hemoglobin A1c is 7.1%  on 07/22/21.          -Primary team to manage SSI  6.  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 -Evaluated by orthopedics, too complex for surgery. -Currently on meropenem for healthcare associated pneumonia -Chronic antibiotic suppression recommended by orthopedics.  -patient requesting second opinion    LOS: 12 Jozef Eisenbeis 6/3/20235:01 PM

## 2021-08-03 NOTE — Progress Notes (Signed)
Progress Note   Patient: [name] IWL:[id number] DOB: 06/20/1965 DOA: 07/21/2021     12 DOS: the patient was seen and examined on 08/03/2021   Brief hospital course: [name] is a 56 year old female with history of hypertension, hyperlipidemia, end-stage renal disease on HD MWF, history of left BKA in 2021, depression, anxiety, insulin-dependent diabetes mellitus, insomnia, chronic pain syndrome, who presents emergency department for chief concerns of pain from falling during transfer from wheelchair to recliner about 1 week ago. She missed dialysis for whole week.  Upon arriving the hospital, she was found to have a distal left femur fracture, deemed no surgery by orthopedics.  But no weightbearing.  Patient is also started on dialysis.   Was waiting on patient to decide on SNF facility for rehab since she was initially advised inpatient rehab but later stated she did not think she would be able to participate in intensive therapy. Pending dialysis spot but SNF placement was confirmed. Discharge was not considered however as WBC had been trending up, workup showed multifocal pneumonia on CXR despite no significant symptoms, started abx 07/27/21, obtained UA for completeness (still makes some urine and this was obtained 05/28 and abnormal), Urine Cx, sputum Cx, blood Cx are all negative.  Also 05/28 drained superficial abscess L hip and sent for culture which was also negative.  d/w ortho 05/29 and obtaining XR and MRI to eval for L hip septic arthritis. 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 05/30 Ortho recommended I&D and hardware removal and place abx beads but pt does not want to proceed w/ surgery despite extensive discussion risk/benefit. ID consulted.   5/31: Had a long discussion with Ortho.  Patient will be very high risk for any surgical intervention with concern of nonhealing wound and  worsening of her underlying comorbidities.  Most likely will have infection recurrent.  They also does not want any incision and drainage as requested by ID to clean the pockets stating that this infection will soon recur as she will need a very extensive debridement. Patient initially received cefepime which was later switched with meropenem based on her prior history of resistant Enterobacter and bacteroids. This is a very unfortunate situation, no definitive solution at this time.  6/1: Patient continued to experience significant pain in left leg.  She was to see her on orthopedic surgeon regarding her new fracture and continuation of infection.  6/2: Patient will be going for percutaneous abscess drainage with IR today. Nursing concern of dark-colored urine-UA ordered to rule out hematuria.  Most likely concentrated urine as patient is ESRD. CBG remained elevated-increase the dose of seemegle and switch her to moderate SSI Patient now had outpatient chair for dialysis, will be discharged once cleared from infectious disease.  6/3: declined CT guided Drainage procedure. Still considering surgical opinion from primary orthopedist.    Assessment and Plan: * Femur fracture, left [facility]) Orthopedic service recommends no surgery  Orthopedic service recommends brace in flexion of the left knee, nonweightbearing Patient to follow-up outpatient with orthopedic provider in 1 week after discharge rehab/SNF placement   Septic arthritis of hip ([place]) Superficial abscess described as recurrent since previous surgery, drained at bedside 05/28 and obtained XR and MRI - concerning for 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  05/30 Ortho recommended I&D and hardware removal and place abx beads but pt does not want to proceed  w/ surgery despite extensive discussion risk/benefit.  ID consulted 05/30 Cultures from L hip abscess obtained 05/28  remain negative. ID switched antibiotics to meropenem based on prior culture results where she grew resistant Enterobacter and bacteroids. Another discussion with orthopedic and they are not going to perform any procedure as risk is very more than benefit.  Patient also does not want any more procedures. -Continue with meropenem, might need long-term suppressive antibiotics on discharge, appreciate ID help Going for percutaneous drainage by IR today Follow-up cultures  Skin abscess L hip Drained copious purulent fluid, sent for wound culture and it remains negative. -Continue with wound care  HCAP (healthcare-associated pneumonia) WBC trending up, w/u reveals pneumonia on CXR despite no significant symptoms though pt does have smoker's cough, no SOB. Poorly controlled diabetes and pt's lack of effort w/ mobilization/inceptive spirometry likely contributing.  Not much upper respiratory symptoms and she completed the required course for presumed pneumonia with cefepime and Zithromax  UTI (urinary tract infection) UA able to be collected 07/28/21, and remain negative Patient received antibiotics for concern of pneumonia and left septic hip  Type 2 diabetes mellitus with renal complication (HCC) CBG remained elevated. -Increase Semglee to 20 units twice daily. -Increase mealtime coverage to 6 units -Change sliding scale to moderate  ESRD (end stage renal disease) on dialysis [facility]) Nephrology following. Patient need a new confirmed seat before discharge to rehab  TOBACCO DEPENDENCE - Nicotine patch as needed for nicotine craving   Anxiety and depression Amitriptyline 25 mg nightly, Celexa 20 mg daily, duloxetine 60 mg daily  Essential hypertension Amlodipine 5 mg daily, carvedilol 6.25 mg p.o. twice daily Hydralazine 5 mg IV every 6 hours.  For SBP greater than 180,   Hyponatremia Corrected sodium at 132.  Most likely pseudohyponatremia secondary to hyperglycemia. -Continue to  monitor  Hyperkalemia Resolved Monitor bmp  Leukocytosis CXR (+) multifocal pneumonia, UA abnormal, also w/u for septic arthritis   Essential hypertension, benign-resolved as of 07/27/2021 - Patient takes amlodipine 5 mg daily, carvedilol 6.25 mg p.o. twice daily, these have been resumed for 07/22/2021 due to low normotensive in setting of requiring hemodialysis - Hydralazine 100 mg 3 times daily have not been resumed due to the same     Subjective: Patient new to me. Apparently declined CT guided L thigh abscess drainage by IR. We discussed risk and benefit of holding off on this procedure. She has a good understanding of this. She was able to get in touch with her primary orthopedist office yesterday but he was in the OR all day and has not called back. She expects to hear back this weekend. Appreciate ID input and guidance.   Physical Exam: Vitals:   08/02/21 1615 08/02/21 2039 08/03/21 0456 08/03/21 0803  BP: (!) 114/56 114/71 (!) 131/54 (!) 137/56  Pulse: 70 63 63 61  Resp:  18 18 15   Temp: 98.9 F (37.2 C) 98.5 F (36.9 C) 97.7 F (36.5 C) 98.5 F (36.9 C)  TempSrc: Oral     SpO2: 97% 96% 92% 100%  Weight:      Height:       General.     In no acute distress. Pulmonary.  Lungs clear bilaterally, normal respiratory effort. CV.  Regular rate and rhythm, no JVD, rub or murmur. Abdomen.  Soft, nontender, nondistended, BS positive. CNS.  Alert and oriented .  No focal neurologic deficit. Extremities.  No edema, left BKA, left knee with splint and left hip with soiled bandage with  purulent discharge. Psychiatry.  Judgment and insight appears normal.  Data Reviewed: Prior notes, labs and images reviewed  Family Communication: Discussed with patient  Disposition: Status is: Inpatient Remains inpatient appropriate because: Severity of illness   Planned Discharge Destination: Skilled nursing facility  Time spent: 45 minutes  This record has been created using Actor. Errors have been sought and corrected,but may not always be located. Such creation errors do not reflect on the standard of care.  Author: Vanna Scotland, MD 08/03/2021 10:52 AM  For on call review [email or web address].

## 2021-08-04 LAB — GLUCOSE, CAPILLARY
Glucose-Capillary: 182 mg/dL — ABNORMAL HIGH (ref 70–99)
Glucose-Capillary: 111 mg/dL — ABNORMAL HIGH (ref 70–99)
Glucose-Capillary: 121 mg/dL — ABNORMAL HIGH (ref 70–99)
Glucose-Capillary: 78 mg/dL (ref 70–99)

## 2021-08-04 NOTE — Progress Notes (Signed)
Progress Note   Patient: [name] YHC:[id number] DOB: 08/13/1965 DOA: 07/21/2021     13 DOS: the patient was seen and examined on 08/04/2021   Brief hospital course: Ms. Arnita Koons is a 56 year old female with history of hypertension, hyperlipidemia, end-stage renal disease on HD MWF, history of left BKA in 2021, depression, anxiety, insulin-dependent diabetes mellitus, insomnia, chronic pain syndrome, who presents emergency department for chief concerns of pain from falling during transfer from wheelchair to recliner about 1 week ago. She missed dialysis for whole week.  Upon arriving the hospital, she was found to have a distal left femur fracture, deemed no surgery by orthopedics.  But no weightbearing.  Patient is also started on dialysis.   Was waiting on patient to decide on SNF facility for rehab since she was initially advised inpatient rehab but later stated she did not think she would be able to participate in intensive therapy. Pending dialysis spot but SNF placement was confirmed. Discharge was not considered however as WBC had been trending up, workup showed multifocal pneumonia on CXR despite no significant symptoms, started abx 07/27/21, obtained UA for completeness (still makes some urine and this was obtained 05/28 and abnormal), Urine Cx, sputum Cx, blood Cx are all negative.  Also 05/28 drained superficial abscess L hip and sent for culture which was also negative.  d/w ortho 05/29 and obtaining XR and MRI to eval for L hip septic arthritis. 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 05/30 Ortho recommended I&D and hardware removal and place abx beads but pt does not want to proceed w/ surgery despite extensive discussion risk/benefit. ID consulted.   5/31: Had a long discussion with Ortho.  Patient will be very high risk for any surgical intervention with concern of nonhealing wound and  worsening of her underlying comorbidities.  Most likely will have infection recurrent.  They also does not want any incision and drainage as requested by ID to clean the pockets stating that this infection will soon recur as she will need a very extensive debridement. Patient initially received cefepime which was later switched with meropenem based on her prior history of resistant Enterobacter and bacteroids. This is a very unfortunate situation, no definitive solution at this time.  6/1: Patient continued to experience significant pain in left leg.  She was to see her on orthopedic surgeon regarding her new fracture and continuation of infection.  6/2: Patient will be going for percutaneous abscess drainage with IR today. Nursing concern of dark-colored urine-UA ordered to rule out hematuria.  Most likely concentrated urine as patient is ESRD. CBG remained elevated-increase the dose of seemegle and switch her to moderate SSI Patient now had outpatient chair for dialysis, will be discharged once cleared from infectious disease.  6/3: declined CT guided Drainage procedure. Still considering surgical opinion from primary orthopedist.    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  05/30 Ortho recommended I&D and hardware removal and place abx beads but pt does not want to proceed  w/ surgery despite extensive discussion risk/benefit.  ID consulted 05/30 Cultures from L hip abscess obtained 05/28  remain negative. ID switched antibiotics to meropenem based on prior culture results where she grew resistant Enterobacter and bacteroids. Another discussion with orthopedic and they are not going to perform any procedure as risk is very more than benefit.  Patient also does not want any more procedures. -Continue with meropenem, might need long-term suppressive antibiotics on discharge, appreciate ID help Going for percutaneous drainage by IR today Follow-up cultures  Skin abscess L hip Drained copious purulent fluid, sent for wound culture and it remains negative. -Continue with wound care  HCAP (healthcare-associated pneumonia) WBC trending up, w/u reveals pneumonia on CXR despite no significant symptoms though pt does have smoker's cough, no SOB. Poorly controlled diabetes and pt's lack of effort w/ mobilization/inceptive spirometry likely contributing.  Not much upper respiratory symptoms and she completed the required course for presumed pneumonia with cefepime and Zithromax  UTI (urinary tract infection) UA able to be collected 07/28/21, and remain negative Patient received antibiotics for concern of pneumonia and left septic hip  Type 2 diabetes mellitus with renal complication (HCC) CBG remained elevated. -Increase Semglee to 20 units twice daily. -Increase mealtime coverage to 6 units -Change sliding scale to moderate  ESRD (end stage renal disease) on dialysis [facility]) Nephrology following. Patient need a new confirmed seat before discharge to rehab  TOBACCO DEPENDENCE - Nicotine patch as needed for nicotine craving   Anxiety and depression Amitriptyline 25 mg nightly, Celexa 20 mg daily, duloxetine 60 mg daily  Essential hypertension Amlodipine 5 mg daily, carvedilol 6.25 mg p.o. twice daily Hydralazine 5 mg IV every 6 hours.  For SBP greater than 180,   Hyponatremia Corrected sodium at 132.  Most likely pseudohyponatremia secondary to hyperglycemia. -Continue to  monitor  Hyperkalemia Resolved Monitor bmp  Leukocytosis CXR (+) multifocal pneumonia, UA abnormal, also w/u for septic arthritis   Essential hypertension, benign-resolved as of 07/27/2021 - Patient takes amlodipine 5 mg daily, carvedilol 6.25 mg p.o. twice daily, these have been resumed for 07/22/2021 due to low normotensive in setting of requiring hemodialysis - Hydralazine 100 mg 3 times daily have not been resumed due to the same     Subjective: Patient new to me. Again had a thorough discussion with patient regarding her presentation, ongoing medical care and management of her medical care. She understands risks of avoiding drainage of fluid collection and understands she has been evaluated by our orthopedic team for management of femur fracture and ongoing management by ID, orthopedist and medical team regarding her septic hip illness. She continues to decline further treatment but primarily to seek outside input from her orthopedist whom we have been unable to reach.  I asked patient to reconsider her options and let us know her decision.   Physical Exam: Vitals:   08/03/21 2030 08/04/21 0502 08/04/21 0749 08/04/21 1119  BP: 113/71 (!) 131/58 128/82 (!) 125/57  Pulse: (!) 59 (!) 58 (!) 59 65  Resp: 17 17 15 16   Temp: 98 F (36.7 C) 97.7 F (36.5 C) 98 F (36.7 C) 97.9 F (36.6 C)  TempSrc:      SpO2: 100% 100% 100% 100%  Weight:      Height:       General.     In no acute distress. Pulmonary.  Lungs clear bilaterally, normal respiratory effort. CV.  Regular rate and rhythm, no JVD, rub or murmur. Abdomen.  Soft, nontender, nondistended, BS  positive. CNS.  Alert and oriented .  No focal neurologic deficit. Extremities.  No edema, left BKA, left knee with splint and left hip with soiled bandage with purulent discharge. Psychiatry.  Judgment and insight appears normal.  Data Reviewed: Prior notes, labs and images reviewed  Family Communication: Discussed with  patient  Disposition: Status is: Inpatient Remains inpatient appropriate because: Severity of illness   Planned Discharge Destination: Skilled nursing facility  Time spent: 45 minutes  This record has been created using Systems analyst. Errors have been sought and corrected,but may not always be located. Such creation errors do not reflect on the standard of care.  Author: Vanna Scotland, MD 08/04/2021 1:08 PM  For on call review [email or web address].

## 2021-08-04 NOTE — Progress Notes (Signed)
Physical Therapy Treatment Patient Details Name: [name] MRN: [id number] DOB: 05-10-1965 Today's Date: 08/04/2021   History of Present Illness Pt is a 56 y/o F admitted on 07/21/21 after presenting to ED for c/c of pain from falling during transfer from w/c to recliner ~1 week ago & pt missed dialysis for a whole week. Pt found to ahve distal L femur fx & deemed no surgery by orthopedics but NWB LLE. PMH: HTN, HLD, ESRD on HD MWF, L BKA in 2021, depression, anxiety, insulin-dependent DM, insomnia, chronic pain syndromes    PT Comments    Patient received in bed, she declines PT initially but when returned 1 hour later she is agreeable to sitting on side of bed only. She declines transferring to recliner or attempting to stand. She requires increased time and effort to get to edge of bed due to pain and fatigue. Patient able to sit without assist for 10-15 min, performed LE strengthening while seated. She will continue to benefit from skilled PT while here to improve strength and functional independence.       Recommendations for follow up therapy are one component of a multi-disciplinary discharge planning process, led by the attending physician.  Recommendations may be updated based on patient status, additional functional criteria and insurance authorization.  Follow Up Recommendations  Skilled nursing-short term rehab (<3 hours/day)     Assistance Recommended at Discharge    Patient can return home with the following A lot of help with walking and/or transfers;A lot of help with bathing/dressing/bathroom;Assistance with cooking/housework;Direct supervision/assist for medications management;Direct supervision/assist for financial management;Assist for transportation;Help with stairs or ramp for entrance   Equipment Recommendations  Other (comment) (TBD)    Recommendations for Other Services       Precautions / Restrictions Precautions Precautions: Fall Restrictions Weight  Bearing Restrictions: Yes LLE Weight Bearing: Non weight bearing Other Position/Activity Restrictions: pt also with chronic L BKA/infection     Mobility  Bed Mobility Overal bed mobility: Needs Assistance Bed Mobility: Supine to Sit, Sit to Supine, Rolling Rolling: Supervision (to her left)   Supine to sit: HOB elevated, Min assist Sit to supine: Min assist   General bed mobility comments: increased time and effort needed to get seated on edge of bed. use of bed rails    Transfers                   General transfer comment: declines attempting to stand or to transfer to recliner.    Ambulation/Gait               General Gait Details: non ambulatory at baseline   Stairs             Wheelchair Mobility    Modified Rankin (Stroke Patients Only)       Balance Overall balance assessment: Needs assistance Sitting-balance support: Feet supported, Bilateral upper extremity supported Sitting balance-Leahy Scale: Good Sitting balance - Comments: able to remain sitting on her own for extended time in varried positions with good comtrol                                    Cognition Arousal/Alertness: Awake/alert Behavior During Therapy: WFL for tasks assessed/performed Overall Cognitive Status: Within Functional Limits for tasks assessed  General Comments: Pt is A and O x4. needs alot of encouragement to participate        Exercises Other Exercises Other Exercises: seated LAQ, marching x 10 reps    General Comments        Pertinent Vitals/Pain Pain Assessment Pain Assessment: 0-10 Pain Score: 8  Pain Descriptors / Indicators: Discomfort, Sore Pain Intervention(s): Monitored during session, Premedicated before session, Repositioned, RN gave pain meds during session    Home Living                          Prior Function            PT Goals (current goals can now be  found in the care plan section) Acute Rehab PT Goals Patient Stated Goal: less pain with movement PT Goal Formulation: With patient Time For Goal Achievement: 08/19/21 Potential to Achieve Goals: Fair Progress towards PT goals: Progressing toward goals    Frequency    Min 2X/week      PT Plan Frequency needs to be updated    Co-evaluation              AM-PAC PT "6 Clicks" Mobility   Outcome Measure  Help needed turning from your back to your side while in a flat bed without using bedrails?: A Lot Help needed moving from lying on your back to sitting on the side of a flat bed without using bedrails?: A Little Help needed moving to and from a bed to a chair (including a wheelchair)?: Total Help needed standing up from a chair using your arms (e.g., wheelchair or bedside chair)?: Total Help needed to walk in hospital room?: Total Help needed climbing 3-5 steps with a railing? : Total 6 Click Score: 9    End of Session   Activity Tolerance: Patient tolerated treatment well;Patient limited by pain Patient left: in bed;with call bell/phone within reach;with family/visitor present Nurse Communication: Mobility status PT Visit Diagnosis: Other abnormalities of gait and mobility (R26.89);Muscle weakness (generalized) (M62.81);History of falling (Z91.81);Pain Pain - Right/Left: Left Pain - part of body: Leg     Time: 7416-3845 PT Time Calculation (min) (ACUTE ONLY): 25 min  Charges:  $Therapeutic Activity: 23-37 mins                     [name], PT, GCS 08/04/21,10:51 AM

## 2021-08-04 NOTE — Progress Notes (Signed)
[facility]  ROUNDING NOTE   Subjective:   Patient resting comfortably in bed at the moment. Due for hemodialysis treatment again tomorrow. States that her pain control is reasonable.   Objective:  Vital signs in last 24 hours:  Temp:  [97.7 F (36.5 C)-98 F (36.7 C)] 97.9 F (36.6 C) (06/04 1119) Pulse Rate:  [58-65] 65 (06/04 1119) Resp:  [15-17] 16 (06/04 1119) BP: (113-139)/(56-82) 125/57 (06/04 1119) SpO2:  [99 %-100 %] 100 % (06/04 1119)  Weight change:  Filed Weights   07/31/21 1218 08/02/21 0915 08/02/21 1244  Weight: 87.7 kg 91 kg 90.2 kg    Intake/Output: I/O last 3 completed shifts: In: 480 [P.O.:480] Out: -    Intake/Output this shift:  Total I/O In: 240 [P.O.:240] Out: -   Physical Exam: General: NAD  Head: Normocephalic, atraumatic. Moist oral mucosal membranes  Eyes: Anicteric  Lungs:  Clear to auscultation,normal effort  Heart: Regular rate and rhythm  Abdomen:  Soft, nontender  Extremities: No peripheral edema. Lt BKA  Neurologic: Alert, able to answer all questions  Skin: No lesions  Access: Lt AVF    Basic Metabolic Panel: Recent Labs  Lab 07/29/21 0420 07/30/21 0345 07/31/21 0329 08/01/21 0333 08/02/21 1543  NA 128* 132* 129* 132*  --   K 5.0 4.0 3.9 3.6 3.1*  CL 96* 97* 94* 97*  --   CO2 24 28 26 28   --   GLUCOSE 314* 372* 316* 381*  --   BUN 89* 42* 58* 33*  --   CREATININE 9.03* 5.24* 6.73* 4.64*  --   CALCIUM 8.9 8.9 8.7* 8.5*  --      Liver Function Tests: No results for input(s): AST, ALT, ALKPHOS, BILITOT, PROT, ALBUMIN in the last 168 hours.  No results for input(s): LIPASE, AMYLASE in the last 168 hours. No results for input(s): AMMONIA in the last 168 hours.  CBC: Recent Labs  Lab 07/29/21 0420 07/30/21 0345 07/31/21 0329 08/01/21 0333  WBC 23.8* 17.3* 16.2* 14.1*  HGB 7.4* 7.7* 7.3* 7.7*  HCT 23.6* 25.3* 24.0* 25.4*  MCV 93.3 94.4 94.9 96.2  PLT 309 299 284 277     Cardiac  Enzymes: No results for input(s): CKTOTAL, CKMB, CKMBINDEX, TROPONINI in the last 168 hours.  BNP: Invalid input(s): POCBNP  CBG: Recent Labs  Lab 08/03/21 1156 08/03/21 1712 08/03/21 2134 08/04/21 0748 08/04/21 1117  GLUCAP 217* 128* 97 182* 111*     Microbiology: Results for orders placed or performed during the hospital encounter of 07/21/21  Culture, blood (Routine X 2) w Reflex to ID Panel     Status: None   Collection Time: 07/22/21  2:44 PM   Specimen: Right Antecubital; Blood  Result Value Ref Range Status   Specimen Description RIGHT ANTECUBITAL  Final   Special Requests   Final    BOTTLES DRAWN AEROBIC AND ANAEROBIC Blood Culture adequate volume   Culture   Final    NO GROWTH 5 DAYS Performed at [facility], [address], [place], [place] [address]    Report Status 07/27/2021 FINAL  Final  Culture, blood (Routine X 2) w Reflex to ID Panel     Status: None   Collection Time: 07/22/21  2:46 PM   Specimen: BLOOD RIGHT HAND  Result Value Ref Range Status   Specimen Description BLOOD RIGHT HAND  Final   Special Requests   Final    BOTTLES DRAWN AEROBIC AND ANAEROBIC Blood Culture adequate volume  Culture   Final    NO GROWTH 5 DAYS Performed at [facility], [address] [place], [place] [address]    Report Status 07/27/2021 FINAL  Final  MRSA Next Gen by PCR, Nasal     Status: None   Collection Time: 07/27/21 12:18 PM   Specimen: Nasal Mucosa; Nasal Swab  Result Value Ref Range Status   MRSA by PCR Next Gen NOT DETECTED NOT DETECTED Final    Comment: (NOTE) The GeneXpert MRSA Assay (FDA approved for NASAL specimens only), is one component of a comprehensive MRSA colonization surveillance program. It is not intended to diagnose MRSA infection nor to guide or monitor treatment for MRSA infections. Test performance is not FDA approved in patients less than 30 years old. Performed at [facility], [address]  [place], [place] [address]   Culture, blood (Routine X 2) w Reflex to ID Panel     Status: None   Collection Time: 07/27/21 12:31 PM   Specimen: BLOOD  Result Value Ref Range Status   Specimen Description BLOOD RIGHT ANTECUBITAL  Final   Special Requests   Final    BOTTLES DRAWN AEROBIC AND ANAEROBIC Blood Culture adequate volume   Culture   Final    NO GROWTH 5 DAYS Performed at [facility], [address], [place], [place] [address]    Report Status 08/01/2021 FINAL  Final  Culture, blood (Routine X 2) w Reflex to ID Panel     Status: None   Collection Time: 07/27/21 12:33 PM   Specimen: BLOOD  Result Value Ref Range Status   Specimen Description BLOOD BLOOD RIGHT HAND  Final   Special Requests   Final    BOTTLES DRAWN AEROBIC AND ANAEROBIC Blood Culture adequate volume   Culture   Final    NO GROWTH 5 DAYS Performed at [facility], [address] [place], [place] [address]    Report Status 08/01/2021 FINAL  Final  Urine Culture     Status: None   Collection Time: 07/28/21  5:46 PM   Specimen: Urine, Clean Catch  Result Value Ref Range Status   Specimen Description   Final    URINE, CLEAN CATCH Performed at [facility], [address] [place], [place] [address]    Special Requests   Final    NONE Performed at [facility], [address], [place], [place] [address]    Culture   Final    NO GROWTH Performed at [facility], [place] [address], [place], [place] [address]    Report Status 07/30/2021 FINAL  Final  Aerobic Culture w Gram Stain (superficial specimen)     Status: None   Collection Time: 07/30/21  6:26 PM   Specimen: Wound  Result Value Ref Range Status   Specimen Description   Final    WOUND Performed at [facility], [address], [place], [place] [address]    Special Requests   Final    THIGH Performed at [facility], [address] [place],  [address]    Gram Stain   Final     FEW WBC PRESENT, PREDOMINANTLY PMN FEW SQUAMOUS EPITHELIAL CELLS PRESENT NO ORGANISMS SEEN    Culture   Final    NO GROWTH 3 DAYS Performed at [facility], [place] [address], [place],  [address]    Report Status 08/02/2021 FINAL  Final  Expectorated Sputum Assessment w Gram Stain, Rflx to  Resp Cult     Status: None   Collection Time: 07/31/21  4:18 PM   Specimen: Sputum  Result Value Ref Range Status   Specimen Description SPUTUM  Final   Special Requests NONE  Final   Sputum evaluation   Final    THIS SPECIMEN IS ACCEPTABLE FOR SPUTUM CULTURE Performed at [facility], [address] [place],  [address]    Report Status 07/31/2021 FINAL  Final  Culture, Respiratory w Gram Stain     Status: None   Collection Time: 07/31/21  4:18 PM   Specimen: SPU  Result Value Ref Range Status   Specimen Description SPUTUM  Final   Special Requests NONE Reflexed from [id number]  Final   Gram Stain   Final    NO SQUAMOUS EPITHELIAL CELLS SEEN RARE WBC SEEN RARE GRAM POSITIVE RODS    Culture RARE CANDIDA ALBICANS  Final   Report Status 08/03/2021 FINAL  Final    Coagulation Studies: No results for input(s): LABPROT, INR in the last 72 hours.  Urinalysis: Recent Labs    08/02/21 1750  COLORURINE YELLOW*  LABSPEC 1.014  PHURINE 5.0  GLUCOSEU 50*  HGBUR NEGATIVE  BILIRUBINUR NEGATIVE  KETONESUR NEGATIVE  PROTEINUR 100*  NITRITE NEGATIVE  LEUKOCYTESUR TRACE*       Imaging: No results found.   Medications:    meropenem (MERREM) IV 500 mg (08/03/21 2150)    amitriptyline  25 mg Oral QHS   amLODipine  5 mg Oral Daily   atorvastatin  40 mg Oral q1800   carvedilol  6.25 mg Oral BID   Chlorhexidine Gluconate Cloth  6 each Topical Q0600   citalopram  20 mg Oral Daily   DULoxetine  60 mg Oral Daily   epoetin (EPOGEN/PROCRIT) injection  10,000 Units Intravenous Q M,W,F-HD   insulin aspart  0-15 Units Subcutaneous TID WC   insulin aspart  0-5 Units  Subcutaneous QHS   insulin aspart  6 Units Subcutaneous TID WC   insulin glargine-yfgn  20 Units Subcutaneous BID   pantoprazole  40 mg Oral Daily   sevelamer carbonate  1,600 mg Oral TID WC   cyclobenzaprine, HYDROmorphone (DILAUDID) injection, nicotine, ondansetron **OR** ondansetron (ZOFRAN) IV, oxyCODONE-acetaminophen, polyethylene glycol  Assessment/ Plan:  [name] is a 56 y.o.  female with past medical history including hyperlipidemia, hypertension, left BKA, diabetes, chronic pain syndrome, and end-stage renal disease on hemodialysis, who was admitted to [facility] on 07/21/2021 for Acidosis [E87.20] Hyperkalemia [E87.5] Elevated BUN [R79.9] ESRD (end stage renal disease) on dialysis ([place]) [N18.6, Z99.2] Femur fracture, left (HCC) [S72.92XA] Closed fracture of left femur, unspecified fracture morphology, unspecified portion of femur, initial encounter ([place]) [S72.92XA] Closed fracture of left distal femur ([place]) [S72.402A]  [facility]/MWF/left aVF/83 kg  End-stage renal disease on dialysis.  Will maintain outpatient schedule if possible.    -Patient will be due for hemodialysis treatment again tomorrow.  Orders have been prepared.  2. Anemia of chronic kidney disease Normocytic Lab Results  Component Value Date   HGB 7.7 (L) 08/01/2021   Continue Epogen 10,000 units IV with dialysis during inpatient stay.  Transition to [facility] as outpatient.  3. Secondary Hyperparathyroidism:  Lab Results  Component Value Date   PTH 44 01/11/2020   PTH Comment 01/11/2020   CALCIUM 8.5 (L) 08/01/2021   CAION 1.10 (L) 04/07/2017   PHOS 5.1 (H) 02/20/2020   Continue Renvela.  Phosphorus at target at 5.1.  4.  Hypertension with chronic  kidney disease.  Continue amlodipine and carvedilol.  5. Diabetes mellitus type II with chronic kidney disease: insulin dependent. Home regimen includes to send NovoLog 70/30. Most recent hemoglobin A1c is 7.1%  on 07/22/21.          -Primary  team to manage SSI  6.  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 -Evaluated by orthopedics, too complex for surgery. -Currently on meropenem for healthcare associated pneumonia -Chronic antibiotic suppression recommended by orthopedics.  -patient requesting second opinion    LOS: 13 Keiara Sneeringer 6/4/20232:29 PM

## 2021-08-05 DIAGNOSIS — E1122 Type 2 diabetes mellitus with diabetic chronic kidney disease: Secondary | ICD-10-CM

## 2021-08-05 DIAGNOSIS — E1169 Type 2 diabetes mellitus with other specified complication: Secondary | ICD-10-CM | POA: Diagnosis not present

## 2021-08-05 DIAGNOSIS — S72492A Other fracture of lower end of left femur, initial encounter for closed fracture: Secondary | ICD-10-CM | POA: Diagnosis not present

## 2021-08-05 DIAGNOSIS — F419 Anxiety disorder, unspecified: Secondary | ICD-10-CM | POA: Diagnosis not present

## 2021-08-05 DIAGNOSIS — M86452 Chronic osteomyelitis with draining sinus, left femur: Secondary | ICD-10-CM | POA: Diagnosis not present

## 2021-08-05 DIAGNOSIS — S72352A Displaced comminuted fracture of shaft of left femur, initial encounter for closed fracture: Secondary | ICD-10-CM | POA: Diagnosis not present

## 2021-08-05 DIAGNOSIS — N186 End stage renal disease: Secondary | ICD-10-CM | POA: Diagnosis not present

## 2021-08-05 LAB — RENAL FUNCTION PANEL
Albumin: 2.5 g/dL — ABNORMAL LOW (ref 3.5–5.0)
Anion gap: 13 (ref 5–15)
BUN: 72 mg/dL — ABNORMAL HIGH (ref 6–20)
CO2: 26 mmol/L (ref 22–32)
Calcium: 8.7 mg/dL — ABNORMAL LOW (ref 8.9–10.3)
Chloride: 88 mmol/L — ABNORMAL LOW (ref 98–111)
Creatinine, Ser: 7.09 mg/dL — ABNORMAL HIGH (ref 0.44–1.00)
GFR, Estimated: 6 mL/min — ABNORMAL LOW (ref >60)
Glucose, Bld: 101 mg/dL — ABNORMAL HIGH (ref 70–99)
Phosphorus: 4.9 mg/dL — ABNORMAL HIGH (ref 2.5–4.6)
Potassium: 4.6 mmol/L (ref 3.5–5.1)
Sodium: 127 mmol/L — ABNORMAL LOW (ref 135–145)

## 2021-08-05 LAB — CBC
HCT: 28.3 % — ABNORMAL LOW (ref 36.0–46.0)
Hemoglobin: 8.7 g/dL — ABNORMAL LOW (ref 12.0–15.0)
MCH: 28.9 pg (ref 26.0–34.0)
MCHC: 30.7 g/dL (ref 30.0–36.0)
MCV: 94 fL (ref 80.0–100.0)
Platelets: 291 10*3/uL (ref 150–400)
RBC: 3.01 MIL/uL — ABNORMAL LOW (ref 3.87–5.11)
RDW: 16.5 % — ABNORMAL HIGH (ref 11.5–15.5)
WBC: 14.6 10*3/uL — ABNORMAL HIGH (ref 4.0–10.5)
nRBC: 0 % (ref 0.0–0.2)

## 2021-08-05 LAB — GLUCOSE, CAPILLARY
Glucose-Capillary: 65 mg/dL — ABNORMAL LOW (ref 70–99)
Glucose-Capillary: 130 mg/dL — ABNORMAL HIGH (ref 70–99)
Glucose-Capillary: 143 mg/dL — ABNORMAL HIGH (ref 70–99)

## 2021-08-05 MED ORDER — OXYCODONE-ACETAMINOPHEN 7.5-325 MG PO TABS: 1.0000 | ORAL_TABLET | Freq: Four times a day (QID) | ORAL | 0 refills | Status: DC | PRN

## 2021-08-05 MED ORDER — INSULIN ASPART 100 UNIT/ML IJ SOLN: 0.0000 [IU] | Freq: Every day | INTRAMUSCULAR | 11 refills | Status: DC

## 2021-08-05 MED ORDER — SODIUM CHLORIDE 0.9 % IV SOLN: 500.0000 mg | INTRAVENOUS | Status: DC

## 2021-08-05 MED ORDER — INSULIN ASPART 100 UNIT/ML IJ SOLN: 0.0000 [IU] | Freq: Three times a day (TID) | INTRAMUSCULAR | 11 refills | Status: DC

## 2021-08-05 MED ORDER — INSULIN ASPART 100 UNIT/ML IJ SOLN
6.0000 [IU] | Freq: Three times a day (TID) | INTRAMUSCULAR | 11 refills | Status: DC
Start: 2021-08-05 — End: 2022-03-18

## 2021-08-05 MED ORDER — INSULIN GLARGINE-YFGN 100 UNIT/ML ~~LOC~~ SOLN
15.0000 [IU] | Freq: Two times a day (BID) | SUBCUTANEOUS | Status: DC
  Administered 2021-08-05: 15 [IU] via SUBCUTANEOUS
  Filled 2021-08-05 (×3): qty 0.15

## 2021-08-05 MED ORDER — INSULIN GLARGINE-YFGN 100 UNIT/ML ~~LOC~~ SOLN: 15.0000 [IU] | Freq: Two times a day (BID) | SUBCUTANEOUS | 11 refills | Status: DC

## 2021-08-05 MED ORDER — EPOETIN ALFA 10000 UNIT/ML IJ SOLN
INTRAMUSCULAR | Status: AC
  Filled 2021-08-05: qty 1

## 2021-08-05 NOTE — Progress Notes (Signed)
ID Pt in bed Daughter at bedside Dr>Amin and myself in the room  Discussed that I spoke to [place] ortho at [facility] and he wants her transferred there to be assessed by Traumatologist to see what can be done for the new fracture of the femur above the knee and for the chronically infected femur hardware since 2021 ( following a fracture)with sinus and osteomyelitis Pt is currently on meropenem Culture from the purulent fluid ( skin swab) is negative She had refused IR aspiration last week In IMAGING RESULTS: I have personally reviewed the films Old hip fracture- with intramedullary nail- nothing new   Post surgical changes  Increased lucency around the femoral neck screw and hardware in the intertrochanteric region Complex fluid collection extending around nail to the surface       Impression/Recommendation 56 year old female with history of end-stage renal disease on dialysis, poorly controlled diabetes mellitus, left BKA presenting after a fall     New comminuted fracture of the lower femur just above the knee on the left side. 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  There is a sinus and pockets of abscess in that area. Osteomyelitis .needs removal of the hardware for clearance of infection. Seen by ortho who thoought this was high risk surgery- she refused to undergo IR aspiration . patient wanted [place] to discuss with her ortho surgeon Dr.Swinteck   On  meropenem as patient has had Enterobacter which was resistant and Bacteroides in the past  Left BKA  End-stage renal disease on hemodialysis  Poorly controlled diabetes mellitus   Anemia of chronic disease.    Discussed with Dr.Swinteck on the phone today and she is being transferred to [facility] cone Discussed with patient and her daughter Conference with Dr.Amin in the room Total time spent coordinating care   40 min

## 2021-08-05 NOTE — Progress Notes (Signed)
Inpatient Diabetes Program Recommendations  AACE/ADA: New Consensus Statement on Inpatient Glycemic Control   Target Ranges:  Prepandial:   less than 140 mg/dL      Peak postprandial:   less than 180 mg/dL (1-2 hours)      Critically ill patients:  140 - 180 mg/dL    Latest Reference Range & Units 08/04/21 07:48 08/04/21 11:17 08/04/21 16:29 08/04/21 22:11 08/05/21 07:35  Glucose-Capillary 70 - 99 mg/dL 182 (H) 111 (H) 121 (H) 78 65 (L)   Review of Glycemic Control  Diabetes history: DM2 Outpatient Diabetes medications:  Lantus 12 units QHS, 70/30 10 units TID with meals Current orders for Inpatient glycemic control: Semglee 20 units BID, Novolog 0-15 units TID, Novolog 0-5 units QHS, Novolog 6 units TID with meals  Inpatient Diabetes Program Recommendations:    Insulin: Please consider decreasing Semglee to 15 units BID.   Thanks, Barnie Alderman, RN, MSN, [place] [facility] Coordinator Inpatient Diabetes Program [phone] (Team Pager from 8am to 5pm)'

## 2021-08-05 NOTE — Discharge Summary (Signed)
Physician Discharge Summary   Patient: [name] MRN: [id number] DOB: 28-May-1965  Admit date:     07/21/2021  Discharge date: 08/07/21  Discharge Physician: Nolberto Hanlon   PCP: Julian Hy, PA-C   Recommendations at discharge:  Patient is being transferred to [facility]. Please contact Dr. Lyla Glassing (orthopedic surgery) once patient is there. She is being transferred under hospitalist service Consult nephrology on arrival.   Discharge Diagnoses: Principal Problem:   Femur fracture, left ([place]) Active Problems:   Septic arthritis of hip ([place])   Skin abscess L hip   HCAP (healthcare-associated pneumonia)   UTI (urinary tract infection)   Type 2 diabetes mellitus with renal complication (HCC)   ESRD (end stage renal disease) on dialysis ([place])   TOBACCO DEPENDENCE   Anxiety and depression   Essential hypertension   Hyponatremia   Insomnia   Obesity (BMI 30.0-34.9)   Leukocytosis   History of CVA (cerebrovascular accident) without residual deficits   Hyperkalemia   Closed fracture of left distal femur ([place])   Uncontrolled type 2 diabetes mellitus with hypoglycemia, with long-term current use of insulin [facility])   Hospital Course: [name] is a 56 year old female with history of hypertension, hyperlipidemia, end-stage renal disease on HD MWF, history of left BKA in 2021, depression, anxiety, insulin-dependent diabetes mellitus, insomnia, chronic pain syndrome, who presents emergency department for chief concerns of pain from falling during transfer from wheelchair to recliner about 1 week ago. She missed dialysis for whole week.  Upon arriving the hospital, she was found to have a distal left femur fracture, deemed no surgery by orthopedics.  But no weightbearing.  Patient is also started on dialysis.   Was waiting on patient to decide on SNF facility for rehab since she was initially advised inpatient rehab but later stated she did not think she would be able to  participate in intensive therapy. Pending dialysis spot but SNF placement was confirmed. Discharge was not considered however as WBC had been trending up, workup showed multifocal pneumonia on CXR despite no significant symptoms, started abx 07/27/21, obtained UA for completeness (still makes some urine and this was obtained 05/28 and abnormal), Urine Cx, sputum Cx, blood Cx are all negative.  Also 05/28 drained superficial abscess L hip and sent for culture which was also negative.  d/w ortho 05/29 and obtaining XR and MRI to eval for L hip septic arthritis. 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 05/30 Ortho recommended I&D and hardware removal and place abx beads but pt does not want to proceed w/ surgery despite extensive discussion risk/benefit. ID consulted.   5/31: Had a long discussion with Ortho.  Patient will be very high risk for any surgical intervention with concern of nonhealing wound and worsening of her underlying comorbidities.  Most likely will have infection recurrent.  They also does not want any incision and drainage as requested by ID to clean the pockets stating that this infection will soon recur as she will need a very extensive debridement. Patient initially received cefepime which was later switched with meropenem based on her prior history of resistant Enterobacter and bacteroids. This is a very unfortunate situation, no definitive solution at this time.  6/1: Patient continued to experience significant pain in left leg.  She was to see her on orthopedic surgeon regarding her new fracture and continuation of infection.  6/2: Patient will be going for percutaneous abscess drainage with IR  today. Nursing concern of dark-colored urine-UA ordered to rule out hematuria.  Most likely concentrated urine as patient is ESRD. CBG remained elevated-increase the dose of seemegle and switch her to moderate  SSI Patient now had outpatient chair for dialysis, will be discharged once cleared from infectious disease.  6/3: declined CT guided Drainage procedure. Still considering surgical opinion from primary orthopedist.   6/5: Episode of becoming hypoglycemic, CBG at 65 requiring intervention.  Decrease Semglee to 15 twice daily. Dr. Lyla Glassing accepted her at [facility].  Patient only want her on orthopedic surgeon, Dr. Lyla Glassing to make decisions regarding her infected hardware and a new fracture. Patient will be transferred to [facility] for further management. She will be going under hospitalist services, please consult Dr. Lyla Glassing or his PA once patient is there.  6/6: Patient still waiting for transfer.  CBG at 91 in the morning.  Patient received only 1 dose of Semglee last night.  No additional insulin was given.  CBG now remained within goal and becoming hypoglycemic at times.  Decrease Semglee to nightly, decrease mealtime coverage to 3 units and make sliding scale sensitive. Repeat CT hip was done by Dr. Lyla Glassing and it shows 1. Ununited left hip fractures with extensive lucency around the hip hardware consistent with extensive osteomyelitis. 2. 6 cm posterior fluid collection/abscess appears to be a connecting with a sinus tract to the skin. 3. Probable septic arthritis, myofasciitis and cellulitis. 4. Associated myofasciitis and cellulitis.   6/7 Going for HD today.   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  05/30 Ortho recommended I&D and hardware removal and place abx beads but pt does not want to proceed w/ surgery despite extensive discussion risk/benefit.  ID consulted 05/30 Cultures from L hip abscess obtained 05/28 remain negative. ID switched antibiotics to meropenem based on prior culture results where she grew resistant Enterobacter and bacteroids. Another discussion with orthopedic and they are not going to perform any procedure as risk is very more than benefit.  Patient also does not want any more procedures. -Continue with meropenem, might need long-term suppressive antibiotics on discharge, appreciate ID help Patient is being transferred to [facility] so her on orthopedic surgeon can be involved in her further management  Skin abscess L hip Drained copious purulent fluid, sent for wound culture and it remains negative. Repeat CT hip which was ordered by her orthopedic surgeon with abscess surrounding hardware and sinus tract to skin -Continue with wound care  HCAP (healthcare-associated pneumonia) WBC trending up, w/u reveals pneumonia on CXR despite no significant symptoms though pt does have smoker's cough, no SOB. Poorly controlled diabetes and pt's lack of effort w/ mobilization/inceptive spirometry likely contributing.  Not much upper respiratory symptoms and she completed the required course for presumed pneumonia with cefepime and Zithromax  UTI (urinary tract infection) UA able to be collected 07/28/21, and remain negative Patient received antibiotics for concern of pneumonia and left septic hip  Type 2 diabetes mellitus with renal complication (HCC) CBG at 91 this morning.  Currently is staying within goal  with some episodes of hypoglycemia intermittently. -Decrease Semglee to 15 units nightly -Decrease mealtime coverage to 3 units -Make sliding scale sensitive -Continue to monitor  ESRD (end stage renal disease) on dialysis [facility]) Nephrology  following. Patient need a new confirmed seat before discharge to rehab  TOBACCO DEPENDENCE - Nicotine patch as needed for nicotine craving   Anxiety and depression Amitriptyline 25 mg nightly, Celexa 20 mg daily, duloxetine 60 mg daily  Essential hypertension Amlodipine 5 mg daily, carvedilol 6.25 mg p.o. twice daily Hydralazine 5 mg IV every 6 hours.  For SBP greater than 180,   Hyponatremia Corrected sodium at 132.  Most likely pseudohyponatremia secondary to hyperglycemia. -Continue to monitor  Hyperkalemia Resolved Monitor bmp  Leukocytosis CXR (+) multifocal pneumonia, UA abnormal, also w/u for septic arthritis   Essential hypertension, benign-resolved as of 07/27/2021 - Patient takes amlodipine 5 mg daily, carvedilol 6.25 mg p.o. twice daily, these have been resumed for 07/22/2021 due to low normotensive in setting of requiring hemodialysis - Hydralazine 100 mg 3 times daily have not been resumed due to the same    Pain control - [place] was reviewed. and patient was instructed, not to drive, operate heavy machinery, perform activities at heights, swimming or participation in water activities or provide baby-sitting services while on Pain, Sleep and Anxiety Medications; until their outpatient Physician has advised to do so again. Also recommended to not to take more than prescribed Pain, Sleep and Anxiety Medications.  Consultants: Orthopedic surgery, infectious disease, nephrology Procedures performed: None Disposition:  [facility] Diet recommendation:  Discharge Diet Orders (From admission, onward)     Start     Ordered   07/25/21 0000  Diet - low sodium heart healthy        07/25/21 1450           Cardiac and Carb modified diet DISCHARGE MEDICATION: Allergies as of 08/07/2021       Reactions   Trazodone Swelling   Latex Rash   Lidocaine Itching        Medication List     STOP taking these  medications    NovoLOG Mix 70/30 FlexPen (70-30) 100 UNIT/ML FlexPen Generic drug: insulin aspart protamine - aspart   oxyCODONE 5 MG immediate release tablet Commonly known as: Oxy IR/ROXICODONE       TAKE these medications    alosetron 1 MG tablet Commonly known as: LOTRONEX Take 1 mg by mouth 2 (two) times daily.   amitriptyline 25 MG tablet Commonly known as: ELAVIL Take 25 mg by mouth at bedtime.   amLODipine 5 MG tablet Commonly known as: NORVASC Take 1 tablet (5 mg total) by mouth daily.   atorvastatin 40 MG tablet Commonly known as: LIPITOR TAKE 1 TABLET (40 MG TOTAL) BY MOUTH DAILY AT 6 PM.   Biotin 10 MG Caps Take 1 capsule by mouth daily.   carvedilol 6.25 MG tablet Commonly known as: COREG Take 1 tablet (6.25 mg total) by mouth 2 (two) times daily.   Cinnamon 500 MG capsule Take 500 mg by mouth daily.   citalopram 20 MG tablet Commonly known as: CELEXA Take 1 tablet (20 mg total) by mouth daily.   cyclobenzaprine 10 MG tablet Commonly known as: FLEXERIL Take 1 tablet by mouth 3 (three) times daily as needed. What changed: Another medication with the same name was removed. Continue taking this medication, and follow the directions you see here.   D3-50 1.25 MG (50000  UT) capsule Generic drug: Cholecalciferol Take 50,000 Units by mouth as directed. Monday, wed, Friday   DULoxetine 60 MG capsule Commonly known as: CYMBALTA Take 60 mg by mouth daily.   hydrALAZINE 100 MG tablet Commonly known as: APRESOLINE Take 1 tablet (100 mg total) by mouth 3 (three) times daily.   insulin aspart 100 UNIT/ML injection Commonly known as: novoLOG Inject 6 Units into the skin 3 (three) times daily with meals.   insulin aspart 100 UNIT/ML injection Commonly known as: novoLOG Inject 0-15 Units into the skin 3 (three) times daily with meals.   insulin aspart 100 UNIT/ML injection Commonly known as: novoLOG Inject 0-5 Units into the skin at bedtime.    insulin glargine 100 UNIT/ML injection Commonly known as: LANTUS Inject 0.12 mLs (12 Units total) into the skin at bedtime.   insulin glargine-yfgn 100 UNIT/ML injection Commonly known as: SEMGLEE Inject 0.15 mLs (15 Units total) into the skin 2 (two) times daily.   insulin glargine-yfgn 100 UNIT/ML injection Commonly known as: SEMGLEE Inject 0.1 mLs (10 Units total) into the skin at bedtime.   iron sucrose in sodium chloride 0.9 % 100 mL Iron Sucrose (Venofer)   meropenem 500 mg in sodium chloride 0.9 % 100 mL Inject 500 mg into the vein daily.   Muscle Rub 10-15 % Crea Apply 1 application topically as needed for muscle pain.   naloxone 4 MG/0.1ML Liqd nasal spray kit Commonly known as: NARCAN 1 spray as directed.   nicotine 21 mg/24hr patch Commonly known as: NICODERM CQ - dosed in mg/24 hours Place 1 patch (21 mg total) onto the skin daily as needed (nicotine craving).   oxyCODONE-acetaminophen 7.5-325 MG tablet Commonly known as: PERCOCET Take 1 tablet by mouth every 6 (six) hours as needed for moderate pain or severe pain.   pantoprazole 40 MG tablet Commonly known as: PROTONIX Take 1 tablet (40 mg total) by mouth daily.   polyethylene glycol 17 g packet Commonly known as: MIRALAX / GLYCOLAX Take 17 g by mouth daily as needed for mild constipation.   sevelamer carbonate 800 MG tablet Commonly known as: RENVELA Take 2 tablets (1,600 mg total) by mouth 3 (three) times daily with meals.   sodium chloride 0.65 % Soln nasal spray Commonly known as: OCEAN Place 1 spray into both nostrils as needed for congestion (nose irritation).               Discharge Care Instructions  (From admission, onward)           Start     Ordered   08/05/21 0000  Discharge wound care:       Comments: Continue current management.   08/05/21 1700   07/25/21 0000  No dressing needed        07/25/21 1450            Contact information for after-discharge care      [facility] .   Service: Skilled Nursing Contact information: [place] [phone]                    Discharge Exam: Danley Danker Weights   08/02/21 1244 08/05/21 0945 08/05/21 1315  Weight: 90.2 kg 94.9 kg 95.8 kg   General.     In no acute distress. Pulmonary.  Lungs clear bilaterally, normal respiratory effort. CV.  Regular rate and rhythm, no JVD, rub or murmur. Abdomen.  Soft, nontender, nondistended,  BS positive. CNS.  Alert and oriented .  No focal neurologic deficit. Extremities.  Left BKA, Psychiatry.  Judgment and insight appears normal.   Condition at discharge: stable  The results of significant diagnostics from this hospitalization (including imaging, microbiology, ancillary and laboratory) are listed below for reference.   Imaging Studies: CT HIP LEFT WO CONTRAST  Result Date: 08/06/2021 CLINICAL DATA:  Persistent left hip pain. Evaluate healing of left hip fracture. EXAM: CT OF THE LEFT HIP WITHOUT CONTRAST TECHNIQUE: Multidetector CT imaging of the left hip was performed according to the standard protocol. Multiplanar CT image reconstructions were also generated. RADIATION DOSE REDUCTION: This exam was performed according to the departmental dose-optimization program which includes automated exposure control, adjustment of the mA and/or kV according to patient size and/or use of iterative reconstruction technique. COMPARISON:  Radiographs 07/29/2021 and MRI left hip 07/29/2021 FINDINGS: As demonstrated on the recent MRI examination there are ununited left hip fractures with extensive lucency around the hip hardware and extensive abnormal fluid collections around the hip consistent with septic arthritis and osteomyelitis. Focal posterior fluid collection measures 6 cm and there appears to be a connecting sinus tract to the skin. Associated myofasciitis and cellulitis. The acetabulum  appears intact. The left hemipelvic bony structures are also intact. No significant intrapelvic abnormalities are identified. Borderline pelvic and enlarged inguinal lymph nodes again demonstrated. IMPRESSION: 1. Ununited left hip fractures with extensive lucency around the hip hardware consistent with extensive osteomyelitis. 2. 6 cm posterior fluid collection/abscess appears to be a connecting with a sinus tract to the skin. 3. Probable septic arthritis, myofasciitis and cellulitis. 4. Associated myofasciitis and cellulitis. Electronically Signed   By: Marijo Sanes M.D.   On: 08/06/2021 08:58   MR HIP LEFT WO CONTRAST  Result Date: 07/29/2021 CLINICAL DATA:  Left hip septic arthritis suspected. Soft tissue infection suspected. End-stage renal disease on hemodialysis. History of left below-the-knee amputation in 2021. Insulin-dependent diabetes. Fell from transfer from wheelchair recliner about 1 week prior to admission. EXAM: MR OF THE LEFT HIP WITHOUT CONTRAST TECHNIQUE: Multiplanar, multisequence MR imaging was performed. No intravenous contrast was administered. COMPARISON:  Pelvis and left hip radiographs 07/29/2021, 01/19/2020, 12/28/2019; CT abdomen and pelvis 02/07/2020 FINDINGS: Bones: Postsurgical changes are again seen of left cephalomedullary nail fixation of a left proximal femoral intertrochanteric fracture seen acutely on 12/28/2019 radiographs and fixated 12/29/2019. On 07/29/2021 there is increased lucency around the femoral intertrochanteric region and femoral neck portions of the hardware suspicious for hardware loosening. There is decreased T1 and intermediate T2 signal heterogeneous material broadly contacting the posterosuperior aspect of the left proximal femoral intertrochanteric region and measuring up to approximately 6.6 x 4.2 x 6.4 cm. This material appears to extend from the junction of the superior greater trochanter in the superior tip of the long axis proximal femoral diaphyseal  intramedullary rod (axial series 6 images 10 through 12). This is suspicious for complex fluid extending posteriorly from the joint space, and is suspicious for an infectious process. Within the more distal aspect of the hardware where the distal transverse screw fixates the proximal femoral diaphyseal nail, there is increased lucency on 07/29/2021 radiographs, and similar decreased T1 and intermediate T2 signal heterogeneous material extends posteriorly and laterally from the superior aspect of the proximal nail, measuring up to 3.6 x 4.0 by 3.3 cm suspicious for an additional infected pocket of fluid. There is moderate to high-grade soft tissue edema and swelling throughout the proximal lateral left thigh, greatest around these suspected  infectious fluid collections. There also appears to be similar complex fluid material extending laterally from the lateral aspect of the femoral neck nail where there is surrounding lucency within the bone. This complex fluid appears to extend laterally through the proximal lateral hip skin surface. Recommend clinical correlation for a draining sinus tract through the lateral skin surface (coronal series 3, image 11 and axial series 5, image 33). This infected region is in between the two above described more superior and inferior fluid collections respectively. Left hip: Articular cartilage and labrum Articular cartilage: Moderate superior left femoroacetabular cartilage thinning. Labrum: High-grade attenuation of the anterior superior left acetabular labrum. Joint effusion Joint effusion:  No left hip joint effusion is seen. Muscles and tendons Muscles and tendons: Diffuse edema throughout the left gluteus minimus gluteus medius, rectus femoris, and visualized proximal vastus lateralis and long head of the biceps femoris muscles. More mild edema within the right gluteus minimus greater than gluteus medius muscles. Mild fluid bright signal within the left common hamstring tendon  origin, likely a small partial-thickness tear. Trace fluid along the peripheral lateral border of the left rectus abdominis muscle insertion on the pubic symphysis (axial series 5, images 23-29). Other findings Miscellaneous:   None. IMPRESSION: 1. Postsurgical changes are seen of left cephalomedullary nail fixation of a proximal left femoral intertrochanteric fracture seen acutely on 12/28/2019 radiographs. On 07/29/2021 radiographs there is increased lucency around the femoral neck screw and hardware in the intertrochanteric region as well as the distal (proximal femoral diaphyseal) interlocking screw. In these regions including the posterosuperior aspect of the greater trochanter, junction of the femoral neck and longer stem femoral diaphyseal intramedullary nail and junction of the distal interlocking screw and tip distal femoral intramedullary nail, there is complex fluid suspicious for infectious material extending laterally. There appears to be a sinus tract extending to the skin surface within the middle of these 3 infected regions. 2. Nonspecific myositis of the surrounding left hip musculature. This also may represent infection of the muscle tissues. 3. Small partial-thickness tear of the common left hamstring tendon origin. Electronically Signed   By: Yvonne Kendall M.D.   On: 07/29/2021 18:38   DG Chest Port 1 View  Result Date: 07/27/2021 CLINICAL DATA:  Difficulty breathing.  No cough.  Leukocytosis. EXAM: PORTABLE CHEST 1 VIEW COMPARISON:  07/21/2021. FINDINGS: Cardiac silhouette is normal in size. No mediastinal or hilar masses. Subtle airspace opacity noted above both hila, greater on the left. Remainder of the lungs is clear. No pleural effusion or pneumothorax. Skeletal structures are grossly intact. IMPRESSION: 1. Subtle airspace opacities extending from the superior hila, left greater than right suspected to be multifocal pneumonia. Electronically Signed   By: Lajean Manes M.D.   On:  07/27/2021 10:34   DG Chest Port 1 View  Result Date: 07/21/2021 CLINICAL DATA:  Femoral fracture. Medical clearance for surgical repair. EXAM: PORTABLE CHEST 1 VIEW COMPARISON:  None Available. FINDINGS: Lung volumes are small, but are symmetric. Interstitial opacity at the left lung base may represent atelectasis or infiltrate. No pneumothorax or pleural effusion. Cardiac size within normal limits. No acute bone abnormality. IMPRESSION: Pulmonary hypoinflation.  Left basilar atelectasis or infiltrate. Electronically Signed   By: Fidela Salisbury M.D.   On: 07/21/2021 21:15   DG Knee Complete 4 Views Left  Result Date: 07/21/2021 CLINICAL DATA:  Fall, left knee pain EXAM: LEFT KNEE - COMPLETE 4+ VIEW COMPARISON:  None Available. FINDINGS: Prior below the knee amputation. There is a comminuted  fracture in the distal left femoral metaphysis. Mildly displaced fracture fragments anteriorly. Diffuse osteopenia. IMPRESSION: Prior below-knee amputation. Comminuted, mildly displaced distal left femoral fracture. Electronically Signed   By: Rolm Baptise M.D.   On: 07/21/2021 18:25   DG HIP UNILAT WITH PELVIS 2-3 VIEWS LEFT  Result Date: 07/29/2021 CLINICAL DATA:  Left hip pain.  Old left hip fracture. EXAM: DG HIP (WITH OR WITHOUT PELVIS) 2-3V LEFT COMPARISON:  CT on 02/07/2020 FINDINGS: Intramedullary rod and screw are again seen transfixing an old intertrochanteric left hip fracture. No evidence of acute fracture or dislocation. Generalized osteopenia is noted. IMPRESSION: Old intertrochanteric left hip fracture. No acute findings. Electronically Signed   By: Marlaine Hind M.D.   On: 07/29/2021 16:37    Microbiology: Results for orders placed or performed during the hospital encounter of 07/21/21  Culture, blood (Routine X 2) w Reflex to ID Panel     Status: None   Collection Time: 07/22/21  2:44 PM   Specimen: Right Antecubital; Blood  Result Value Ref Range Status   Specimen Description RIGHT  ANTECUBITAL  Final   Special Requests   Final    BOTTLES DRAWN AEROBIC AND ANAEROBIC Blood Culture adequate volume   Culture   Final    NO GROWTH 5 DAYS Performed at [facility], [address], [place], [place] [address]    Report Status 07/27/2021 FINAL  Final  Culture, blood (Routine X 2) w Reflex to ID Panel     Status: None   Collection Time: 07/22/21  2:46 PM   Specimen: BLOOD RIGHT HAND  Result Value Ref Range Status   Specimen Description BLOOD RIGHT HAND  Final   Special Requests   Final    BOTTLES DRAWN AEROBIC AND ANAEROBIC Blood Culture adequate volume   Culture   Final    NO GROWTH 5 DAYS Performed at [facility], [address], [place], [place] [address]    Report Status 07/27/2021 FINAL  Final  MRSA Next Gen by PCR, Nasal     Status: None   Collection Time: 07/27/21 12:18 PM   Specimen: Nasal Mucosa; Nasal Swab  Result Value Ref Range Status   MRSA by PCR Next Gen NOT DETECTED NOT DETECTED Final    Comment: (NOTE) The GeneXpert MRSA Assay (FDA approved for NASAL specimens only), is one component of a comprehensive MRSA colonization surveillance program. It is not intended to diagnose MRSA infection nor to guide or monitor treatment for MRSA infections. Test performance is not FDA approved in patients less than 54 years old. Performed at [facility], [address] [place], [place] [address]   Culture, blood (Routine X 2) w Reflex to ID Panel     Status: None   Collection Time: 07/27/21 12:31 PM   Specimen: BLOOD  Result Value Ref Range Status   Specimen Description BLOOD RIGHT ANTECUBITAL  Final   Special Requests   Final    BOTTLES DRAWN AEROBIC AND ANAEROBIC Blood Culture adequate volume   Culture   Final    NO GROWTH 5 DAYS Performed at [facility], [address], [place], [place] [address]    Report Status 08/01/2021 FINAL  Final  Culture, blood (Routine X 2) w Reflex to ID Panel     Status: None    Collection Time: 07/27/21 12:33 PM   Specimen: BLOOD  Result Value Ref Range Status   Specimen Description BLOOD BLOOD RIGHT HAND  Final   Special Requests  Final    BOTTLES DRAWN AEROBIC AND ANAEROBIC Blood Culture adequate volume   Culture   Final    NO GROWTH 5 DAYS Performed at [facility], [address] [place], [place] [address]    Report Status 08/01/2021 FINAL  Final  Urine Culture     Status: None   Collection Time: 07/28/21  5:46 PM   Specimen: Urine, Clean Catch  Result Value Ref Range Status   Specimen Description   Final    URINE, CLEAN CATCH Performed at [facility], [address], [place], [place] [address]    Special Requests   Final    NONE Performed at [facility], [address] [place], [place] [address]    Culture   Final    NO GROWTH Performed at [facility], [place] [address], [place], [place] [address]    Report Status 07/30/2021 FINAL  Final  Aerobic Culture w Gram Stain (superficial specimen)     Status: None   Collection Time: 07/30/21  6:26 PM   Specimen: Wound  Result Value Ref Range Status   Specimen Description   Final    WOUND Performed at [facility], [address] [place], [place] [address]    Special Requests   Final    THIGH Performed at [facility], [address] [place], [place] [address]    Gram Stain   Final    FEW WBC PRESENT, PREDOMINANTLY PMN FEW SQUAMOUS EPITHELIAL CELLS PRESENT NO ORGANISMS SEEN    Culture   Final    NO GROWTH 3 DAYS Performed at [facility], [place] [address] [place], [place] [address]    Report Status 08/02/2021 FINAL  Final  Expectorated Sputum Assessment w Gram Stain, Rflx to Resp Cult     Status: None   Collection Time: 07/31/21  4:18 PM   Specimen: Sputum  Result Value Ref Range Status   Specimen Description SPUTUM  Final   Special Requests NONE  Final   Sputum evaluation   Final    THIS SPECIMEN IS ACCEPTABLE FOR SPUTUM  CULTURE Performed at [facility], [address] [place], [place] [address]    Report Status 07/31/2021 FINAL  Final  Culture, Respiratory w Gram Stain     Status: None   Collection Time: 07/31/21  4:18 PM   Specimen: SPU  Result Value Ref Range Status   Specimen Description SPUTUM  Final   Special Requests NONE Reflexed from [id number]  Final   Gram Stain   Final    NO SQUAMOUS EPITHELIAL CELLS SEEN RARE WBC SEEN RARE GRAM POSITIVE RODS    Culture RARE CANDIDA ALBICANS  Final   Report Status 08/03/2021 FINAL  Final    Labs: CBC: Recent Labs  Lab 08/01/21 0333 08/05/21 0922  WBC 14.1* 14.6*  HGB 7.7* 8.7*  HCT 25.4* 28.3*  MCV 96.2 94.0  PLT 277 063   Basic Metabolic Panel: Recent Labs  Lab 08/01/21 0333 08/02/21 1543 08/05/21 0922  NA 132*  --  127*  K 3.6 3.1* 4.6  CL 97*  --  88*  CO2 28  --  26  GLUCOSE 381*  --  101*  BUN 33*  --  72*  CREATININE 4.64*  --  7.09*  CALCIUM 8.5*  --  8.7*  PHOS  --   --  4.9*   Liver Function Tests: Recent Labs  Lab 08/05/21 0922  ALBUMIN 2.5*   CBG:  Recent Labs  Lab 08/06/21 1613 08/06/21 2128 08/07/21 0750 08/07/21 1125 08/07/21 1559  GLUCAP 106* 127* 73 99 83    Discharge time spent: greater than 30 minutes.  This record has been created using Systems analyst. Errors have been sought and corrected,but may not always be located. Such creation errors do not reflect on the standard of care.   Signed: Nolberto Hanlon, MD [facility] 08/07/2021

## 2021-08-05 NOTE — Assessment & Plan Note (Signed)
Hypoglycemic episode this morning with CBG of 65 requiring intervention. -Decrease Semglee to 15 units twice daily -Continue mealtime coverage to 6 units -Continue moderate SSI

## 2021-08-05 NOTE — Plan of Care (Signed)
  Problem: Clinical Measurements: Goal: Ability to maintain clinical measurements within normal limits will improve Outcome: Progressing   Problem: Clinical Measurements: Goal: Will remain free from infection Outcome: Progressing   Problem: Clinical Measurements: Goal: Respiratory complications will improve Outcome: Progressing   Problem: Clinical Measurements: Goal: Cardiovascular complication will be avoided Outcome: Progressing   Problem: Activity: Goal: Risk for activity intolerance will decrease Outcome: Progressing   Problem: Nutrition: Goal: Adequate nutrition will be maintained Outcome: Progressing   Problem: Safety: Goal: Ability to remain free from injury will improve Outcome: Progressing   Problem: Skin Integrity: Goal: Risk for impaired skin integrity will decrease Outcome: Progressing   Problem: Education: Goal: Ability to describe self-care measures that may prevent or decrease complications (Diabetes Survival Skills Education) will improve Outcome: Progressing

## 2021-08-05 NOTE — Progress Notes (Signed)
[facility]  ROUNDING NOTE   Subjective:   Patient seen and evaluated during dialysis   HEMODIALYSIS FLOWSHEET:  Blood Flow Rate (mL/min): 350 mL/min Arterial Pressure (mmHg): 150 mmHg Venous Pressure (mmHg): 150 mmHg Transmembrane Pressure (mmHg): 50 mmHg Ultrafiltration Rate (mL/min): 500 mL/min Dialysate Flow Rate (mL/min): 500 ml/min Conductivity: 14 Conductivity: 14 Dialysis Fluid Bolus: Normal Saline Bolus Amount (mL): 250 mL DO NOT USE: Dialysate Change: Other (comment) (bath changed to 3K)  No complaints Pain managed with prescribed medications.    Objective:  Vital signs in last 24 hours:  Temp:  [97.4 F (36.3 C)-98.6 F (37 C)] 98.6 F (37 C) (06/05 1315) Pulse Rate:  [58-65] 65 (06/05 1325) Resp:  [10-18] 14 (06/05 1325) BP: (106-136)/(48-66) 125/54 (06/05 1325) SpO2:  [95 %-100 %] 100 % (06/05 1325) Weight:  [94.9 kg-95.8 kg] 95.8 kg (06/05 1315)  Weight change:  Filed Weights   08/02/21 1244 08/05/21 0945 08/05/21 1315  Weight: 90.2 kg 94.9 kg 95.8 kg    Intake/Output: I/O last 3 completed shifts: In: 480 [P.O.:480] Out: -    Intake/Output this shift:  Total I/O In: -  Out: 1000 [Other:1000]  Physical Exam: General: NAD  Head: Normocephalic, atraumatic. Moist oral mucosal membranes  Eyes: Anicteric  Lungs:  Clear to auscultation,normal effort  Heart: Regular rate and rhythm  Abdomen:  Soft, nontender  Extremities: No peripheral edema. Lt BKA  Neurologic: Alert, able to answer all questions  Skin: No lesions  Access: Lt AVF    Basic Metabolic Panel: Recent Labs  Lab 07/30/21 0345 07/31/21 0329 08/01/21 0333 08/02/21 1543 08/05/21 0922  NA 132* 129* 132*  --  127*  K 4.0 3.9 3.6 3.1* 4.6  CL 97* 94* 97*  --  88*  CO2 28 26 28   --  26  GLUCOSE 372* 316* 381*  --  101*  BUN 42* 58* 33*  --  72*  CREATININE 5.24* 6.73* 4.64*  --  7.09*  CALCIUM 8.9 8.7* 8.5*  --  8.7*  PHOS  --   --   --   --  4.9*     Liver  Function Tests: Recent Labs  Lab 08/05/21 0922  ALBUMIN 2.5*    No results for input(s): LIPASE, AMYLASE in the last 168 hours. No results for input(s): AMMONIA in the last 168 hours.  CBC: Recent Labs  Lab 07/30/21 0345 07/31/21 0329 08/01/21 0333 08/05/21 0922  WBC 17.3* 16.2* 14.1* 14.6*  HGB 7.7* 7.3* 7.7* 8.7*  HCT 25.3* 24.0* 25.4* 28.3*  MCV 94.4 94.9 96.2 94.0  PLT 299 284 277 291     Cardiac Enzymes: No results for input(s): CKTOTAL, CKMB, CKMBINDEX, TROPONINI in the last 168 hours.  BNP: Invalid input(s): POCBNP  CBG: Recent Labs  Lab 08/04/21 0748 08/04/21 1117 08/04/21 1629 08/04/21 2211 08/05/21 0735  GLUCAP 182* 111* 121* 78 66*     Microbiology: Results for orders placed or performed during the hospital encounter of 07/21/21  Culture, blood (Routine X 2) w Reflex to ID Panel     Status: None   Collection Time: 07/22/21  2:44 PM   Specimen: Right Antecubital; Blood  Result Value Ref Range Status   Specimen Description RIGHT ANTECUBITAL  Final   Special Requests   Final    BOTTLES DRAWN AEROBIC AND ANAEROBIC Blood Culture adequate volume   Culture   Final    NO GROWTH 5 DAYS Performed at [facility], [address] [place], [place]  [address]    Report Status 07/27/2021 FINAL  Final  Culture, blood (Routine X 2) w Reflex to ID Panel     Status: None   Collection Time: 07/22/21  2:46 PM   Specimen: BLOOD RIGHT HAND  Result Value Ref Range Status   Specimen Description BLOOD RIGHT HAND  Final   Special Requests   Final    BOTTLES DRAWN AEROBIC AND ANAEROBIC Blood Culture adequate volume   Culture   Final    NO GROWTH 5 DAYS Performed at [facility], [address] [place], [place] [address]    Report Status 07/27/2021 FINAL  Final  MRSA Next Gen by PCR, Nasal     Status: None   Collection Time: 07/27/21 12:18 PM   Specimen: Nasal Mucosa; Nasal Swab  Result Value Ref Range Status   MRSA by PCR Next Gen NOT  DETECTED NOT DETECTED Final    Comment: (NOTE) The GeneXpert MRSA Assay (FDA approved for NASAL specimens only), is one component of a comprehensive MRSA colonization surveillance program. It is not intended to diagnose MRSA infection nor to guide or monitor treatment for MRSA infections. Test performance is not FDA approved in patients less than 57 years old. Performed at [facility], [address] [place], [place] [address]   Culture, blood (Routine X 2) w Reflex to ID Panel     Status: None   Collection Time: 07/27/21 12:31 PM   Specimen: BLOOD  Result Value Ref Range Status   Specimen Description BLOOD RIGHT ANTECUBITAL  Final   Special Requests   Final    BOTTLES DRAWN AEROBIC AND ANAEROBIC Blood Culture adequate volume   Culture   Final    NO GROWTH 5 DAYS Performed at [facility], [address], [place], [place] [address]    Report Status 08/01/2021 FINAL  Final  Culture, blood (Routine X 2) w Reflex to ID Panel     Status: None   Collection Time: 07/27/21 12:33 PM   Specimen: BLOOD  Result Value Ref Range Status   Specimen Description BLOOD BLOOD RIGHT HAND  Final   Special Requests   Final    BOTTLES DRAWN AEROBIC AND ANAEROBIC Blood Culture adequate volume   Culture   Final    NO GROWTH 5 DAYS Performed at [facility], [address] [place], [place] [address]    Report Status 08/01/2021 FINAL  Final  Urine Culture     Status: None   Collection Time: 07/28/21  5:46 PM   Specimen: Urine, Clean Catch  Result Value Ref Range Status   Specimen Description   Final    URINE, CLEAN CATCH Performed at [facility], [address] [place], [place] [address]    Special Requests   Final    NONE Performed at [facility], [address], [place], [place] [address]    Culture   Final    NO GROWTH Performed at [facility], [place] [address], [place], [place] [address]    Report Status 07/30/2021 FINAL  Final   Aerobic Culture w Gram Stain (superficial specimen)     Status: None   Collection Time: 07/30/21  6:26 PM   Specimen: Wound  Result Value Ref Range Status   Specimen Description   Final    WOUND Performed at [facility], [address] [place],  [address]    Special Requests   Final    THIGH Performed at [facility], [place], [place] [address]    Gram Stain   Final    FEW WBC PRESENT, PREDOMINANTLY PMN FEW SQUAMOUS EPITHELIAL CELLS PRESENT NO ORGANISMS SEEN    Culture   Final    NO GROWTH 3 DAYS Performed at [place] [address], [place], [place] [address]    Report Status 08/02/2021 FINAL  Final  Expectorated Sputum Assessment w Gram Stain, Rflx to Resp Cult     Status: None   Collection Time: 07/31/21  4:18 PM   Specimen: Sputum  Result Value Ref Range Status   Specimen Description SPUTUM  Final   Special Requests NONE  Final   Sputum evaluation   Final    THIS SPECIMEN IS ACCEPTABLE FOR SPUTUM CULTURE Performed at [facility], [address], [place],  [address]    Report Status 07/31/2021 FINAL  Final  Culture, Respiratory w Gram Stain     Status: None   Collection Time: 07/31/21  4:18 PM   Specimen: SPU  Result Value Ref Range Status   Specimen Description SPUTUM  Final   Special Requests NONE Reflexed from [id number]  Final   Gram Stain   Final    NO SQUAMOUS EPITHELIAL CELLS SEEN RARE WBC SEEN RARE GRAM POSITIVE RODS    Culture RARE CANDIDA ALBICANS  Final   Report Status 08/03/2021 FINAL  Final    Coagulation Studies: No results for input(s): LABPROT, INR in the last 72 hours.  Urinalysis: Recent Labs    08/02/21 1750  COLORURINE YELLOW*  LABSPEC 1.014  PHURINE 5.0  GLUCOSEU 50*  HGBUR NEGATIVE  BILIRUBINUR NEGATIVE  KETONESUR NEGATIVE  PROTEINUR 100*  NITRITE NEGATIVE  LEUKOCYTESUR TRACE*       Imaging: No results found.   Medications:    meropenem (MERREM)  IV Stopped (08/05/21 0744)    amitriptyline  25 mg Oral QHS   amLODipine  5 mg Oral Daily   atorvastatin  40 mg Oral q1800   carvedilol  6.25 mg Oral BID   Chlorhexidine Gluconate Cloth  6 each Topical Q0600   citalopram  20 mg Oral Daily   DULoxetine  60 mg Oral Daily   epoetin alfa       epoetin (EPOGEN/PROCRIT) injection  10,000 Units Intravenous Q M,W,F-HD   insulin aspart  0-15 Units Subcutaneous TID WC   insulin aspart  0-5 Units Subcutaneous QHS   insulin aspart  6 Units Subcutaneous TID WC   insulin glargine-yfgn  15 Units Subcutaneous BID   pantoprazole  40 mg Oral Daily   sevelamer carbonate  1,600 mg Oral TID WC   cyclobenzaprine, HYDROmorphone (DILAUDID) injection, nicotine, ondansetron **OR** ondansetron (ZOFRAN) IV, oxyCODONE-acetaminophen, polyethylene glycol  Assessment/ Plan:  [name] is a 56 y.o.  female with past medical history including hyperlipidemia, hypertension, left BKA, diabetes, chronic pain syndrome, and end-stage renal disease on hemodialysis, who was admitted to [facility] on 07/21/2021 for Acidosis [E87.20] Hyperkalemia [E87.5] Elevated BUN [R79.9] ESRD (end stage renal disease) on dialysis ([place]) [N18.6, Z99.2] Femur fracture, left (HCC) [S72.92XA] Closed fracture of left femur, unspecified fracture morphology, unspecified portion of femur, initial encounter ([place]) [S72.92XA] Closed fracture of left distal femur ([place]) [S72.402A]  [facility]/MWF/left aVF/83 kg  End-stage renal disease on dialysis.  Will maintain outpatient schedule if possible.    -Receiving dialysis today, UF goal 0.5-1L as tolerated. Next treatment scheduled for Wednesday.   -Appreciate Renal Navigator confirming outpatient clinic at [facility], on a MWF schedule.   2. Anemia of chronic kidney disease Normocytic Lab Results  Component Value Date   HGB 8.7 (L) 08/05/2021   Continue Epogen 10,000 units IV with dialysis. Will transition to [facility] as  outpatient.  3. Secondary Hyperparathyroidism:  Lab Results  Component Value Date   PTH 44 01/11/2020   PTH Comment 01/11/2020   CALCIUM 8.7 (L) 08/05/2021   CAION 1.10 (L) 04/07/2017   PHOS 4.9 (H) 08/05/2021   Continue Renvela.  Phosphorus and calcium within desired range  4.  Hypertension with chronic kidney disease.  Continue amlodipine and carvedilol.  5. Diabetes mellitus type II with chronic kidney disease: insulin dependent. Home regimen includes to send NovoLog 70/30. Most recent hemoglobin A1c is 7.1%  on 07/22/21.          -Primary team to manage SSI  6.  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 -Evaluated by orthopedics, too complex for surgery. -Currently on meropenem for healthcare associated pneumonia -Chronic antibiotic suppression recommended by orthopedics.  -Continues to request second opinion from primary orthopedist.   -ID following to determine antibiotic therapy, requested drain placement for adequate sample, patient refused.    LOS: [place] 6/5/20232:05 PM

## 2021-08-05 NOTE — TOC Progression Note (Signed)
Transition of Care [facility]) - Progression Note    Patient Details  Name: [name] MRN: [id number] Date of Birth: [birth year]  Transition of Care [facility]) CM/SW [place], RN Phone Number: 08/05/2021, 3:04 PM  Clinical Narrative:     Met with the patient and discussed options, Either transfer to [facility] to be treated by her Surgeon or to Discharge to [facility] and follow up with surgeon outpatient The patient contacted her PCP and her daughter and decided to Transfer to [facility] for treatment I notified the physician  Expected Discharge Plan: Skilled Nursing Facility Barriers to Discharge:  (chair time, dilaysis)  Expected Discharge Plan and Services Expected Discharge Plan: [place]   Discharge Planning Services: CM Consult   Living arrangements for the past 2 months: Single Family Home                 DME Arranged: N/A                     Social Determinants of Health (SDOH) Interventions    Readmission Risk Interventions    07/26/2021    3:15 PM  Readmission Risk Prevention Plan  Transportation Screening Complete  PCP or Specialist Appt within 3-5 Days Complete  HRI or Home Care Consult Complete  Palliative Care Screening Not Applicable  Medication Review (RN Care Manager) Referral to Pharmacy

## 2021-08-05 NOTE — Plan of Care (Signed)
  Problem: Education: Goal: Knowledge of General Education information will improve Description: Including pain rating scale, medication(s)/side effects and non-pharmacologic comfort measures Outcome: Not Progressing   Problem: Health Behavior/Discharge Planning: Goal: Ability to manage health-related needs will improve Outcome: Progressing   Problem: Clinical Measurements: Goal: Ability to maintain clinical measurements within normal limits will improve Outcome: Progressing Goal: Will remain free from infection Outcome: Progressing Goal: Diagnostic test results will improve Outcome: Progressing Goal: Respiratory complications will improve Outcome: Progressing Goal: Cardiovascular complication will be avoided Outcome: Progressing   Problem: Activity: Goal: Risk for activity intolerance will decrease Outcome: Not Progressing   Problem: Nutrition: Goal: Adequate nutrition will be maintained Outcome: Not Progressing   Problem: Coping: Goal: Level of anxiety will decrease Outcome: Not Progressing   Problem: Elimination: Goal: Will not experience complications related to bowel motility Outcome: Progressing Goal: Will not experience complications related to urinary retention Outcome: Progressing   Problem: Pain Managment: Goal: General experience of comfort will improve Outcome: Not Progressing   Problem: Safety: Goal: Ability to remain free from injury will improve Outcome: Progressing   Problem: Skin Integrity: Goal: Risk for impaired skin integrity will decrease Outcome: Not Progressing   Problem: Education: Goal: Ability to describe self-care measures that may prevent or decrease complications (Diabetes Survival Skills Education) will improve Outcome: Not Progressing Goal: Individualized Educational Video(s) Outcome: Not Progressing   Problem: Coping: Goal: Ability to adjust to condition or change in health will improve Outcome: Not Progressing   Problem:  Fluid Volume: Goal: Ability to maintain a balanced intake and output will improve Outcome: Not Progressing   Problem: Health Behavior/Discharge Planning: Goal: Ability to identify and utilize available resources and services will improve Outcome: Not Progressing Goal: Ability to manage health-related needs will improve Outcome: Not Progressing   Problem: Metabolic: Goal: Ability to maintain appropriate glucose levels will improve Outcome: Not Progressing   Problem: Nutritional: Goal: Maintenance of adequate nutrition will improve Outcome: Not Progressing Goal: Progress toward achieving an optimal weight will improve Outcome: Not Progressing   Problem: Skin Integrity: Goal: Risk for impaired skin integrity will decrease Outcome: Not Progressing   Problem: Tissue Perfusion: Goal: Adequacy of tissue perfusion will improve Outcome: Not Progressing

## 2021-08-05 NOTE — Progress Notes (Signed)
Hemodialysis Post Treatment Note:  Tx date:08/05/2021 Tx time:3 hours Access:Left AVF UF Removed: 1L  Note:  HD completed. Tolerated well. No  HD complications.

## 2021-08-05 NOTE — Care Management Important Message (Signed)
Important Message  Patient Details  Name: [name] MRN: [id number] Date of Birth: 12/19/1965   Medicare Important Message Given:  Yes     Juliann Pulse A Harneet Noblett 08/05/2021, 1:15 PM

## 2021-08-05 NOTE — Progress Notes (Signed)
Patient is set up at [facility] MWF 11:45am. Start date 6/7 at 11:00am.

## 2021-08-06 ENCOUNTER — Inpatient Hospital Stay: Payer: Medicare Other

## 2021-08-06 DIAGNOSIS — N186 End stage renal disease: Secondary | ICD-10-CM | POA: Diagnosis not present

## 2021-08-06 DIAGNOSIS — M86452 Chronic osteomyelitis with draining sinus, left femur: Secondary | ICD-10-CM | POA: Diagnosis not present

## 2021-08-06 DIAGNOSIS — S7292XA Unspecified fracture of left femur, initial encounter for closed fracture: Secondary | ICD-10-CM | POA: Diagnosis not present

## 2021-08-06 DIAGNOSIS — Z992 Dependence on renal dialysis: Secondary | ICD-10-CM | POA: Diagnosis not present

## 2021-08-06 LAB — GLUCOSE, CAPILLARY
Glucose-Capillary: 91 mg/dL (ref 70–99)
Glucose-Capillary: 143 mg/dL — ABNORMAL HIGH (ref 70–99)
Glucose-Capillary: 106 mg/dL — ABNORMAL HIGH (ref 70–99)
Glucose-Capillary: 127 mg/dL — ABNORMAL HIGH (ref 70–99)

## 2021-08-06 MED ORDER — INSULIN ASPART 100 UNIT/ML IJ SOLN
0.0000 [IU] | Freq: Three times a day (TID) | INTRAMUSCULAR | Status: DC
  Administered 2021-08-06: 1 [IU] via SUBCUTANEOUS
  Filled 2021-08-06: qty 1

## 2021-08-06 MED ORDER — SODIUM CHLORIDE 0.9 % IV SOLN: INTRAVENOUS | Status: DC | PRN

## 2021-08-06 MED ORDER — INSULIN ASPART 100 UNIT/ML IJ SOLN
3.0000 [IU] | Freq: Three times a day (TID) | INTRAMUSCULAR | Status: DC
Start: 2021-08-06 — End: 2021-08-07
  Administered 2021-08-06: 3 [IU] via SUBCUTANEOUS
  Filled 2021-08-06: qty 1

## 2021-08-06 MED ORDER — INSULIN GLARGINE-YFGN 100 UNIT/ML ~~LOC~~ SOLN
15.0000 [IU] | Freq: Every day | SUBCUTANEOUS | Status: DC
Start: 2021-08-06 — End: 2021-08-07
  Administered 2021-08-06: 15 [IU] via SUBCUTANEOUS
  Filled 2021-08-06: qty 0.15

## 2021-08-06 NOTE — Assessment & Plan Note (Signed)
UA able to be collected 07/28/21, and remain negative  Patient received antibiotics for concern of pneumonia and left septic hip

## 2021-08-06 NOTE — Progress Notes (Signed)
Inpatient Diabetes Program Recommendations  AACE/ADA: New Consensus Statement on Inpatient Glycemic Control   Target Ranges:  Prepandial:   less than 140 mg/dL      Peak postprandial:   less than 180 mg/dL (1-2 hours)      Critically ill patients:  140 - 180 mg/dL    Latest Reference Range & Units 08/05/21 07:35 08/05/21 16:28 08/05/21 21:39 08/06/21 07:54  Glucose-Capillary 70 - 99 mg/dL 65 (L) 130 (H) 143 (H) 91   Review of Glycemic Control  Diabetes history: DM2 Outpatient Diabetes medications:  Lantus 12 units QHS, 70/30 10 units TID with meals Current orders for Inpatient glycemic control: Semglee 15 units BID, Novolog 0-15 units TID, Novolog 0-5 units QHS, Novolog 6 units TID with meals   Inpatient Diabetes Program Recommendations:     Insulin: Patient only received Semglee 15 units one time on 08/05/21 (given at 21:46) and fasting glucose 91 mg/dl today. Also noted, patient did not receive any meal coverage on 08/05/21. Please consider decreasing Semglee to 15 units QHS and may want to decrease meal coverage to Novolog 2 units TID if patient eats at least 50% of meals.  Thanks, Barnie Alderman, RN, MSN, [place] Diabetes Coordinator Inpatient Diabetes Program [phone] (Team Pager from 8am to [place])

## 2021-08-06 NOTE — Progress Notes (Signed)
[facility]  ROUNDING NOTE   Subjective:   Patient laying in bed Tolerating meals without nausea or vomiting Remains on room air  Expected to transfer to [place] today   Objective:  Vital signs in last 24 hours:  Temp:  [97.8 F (36.6 C)-98.6 F (37 C)] 98.2 F (36.8 C) (06/06 0755) Pulse Rate:  [61-66] 61 (06/06 0755) Resp:  [10-20] 16 (06/06 0755) BP: (98-143)/(48-83) 143/55 (06/06 0755) SpO2:  [95 %-100 %] 99 % (06/06 0755) Weight:  [95.8 kg] 95.8 kg (06/05 1315)  Weight change:  Filed Weights   08/02/21 1244 08/05/21 0945 08/05/21 1315  Weight: 90.2 kg 94.9 kg 95.8 kg    Intake/Output: I/O last 3 completed shifts: In: 780 [P.O.:480; IV Piggyback:300] Out: 1000 [Other:1000]   Intake/Output this shift:  No intake/output data recorded.  Physical Exam: General: NAD  Head: Normocephalic, atraumatic. Moist oral mucosal membranes  Eyes: Anicteric  Lungs:  Clear to auscultation,normal effort  Heart: Regular rate and rhythm  Abdomen:  Soft, nontender  Extremities: No peripheral edema. Lt BKA  Neurologic: Alert, able to answer all questions  Skin: No lesions  Access: Lt AVF    Basic Metabolic Panel: Recent Labs  Lab 07/31/21 0329 08/01/21 0333 08/02/21 1543 08/05/21 0922  NA 129* 132*  --  127*  K 3.9 3.6 3.1* 4.6  CL 94* 97*  --  88*  CO2 26 28  --  26  GLUCOSE 316* 381*  --  101*  BUN 58* 33*  --  72*  CREATININE 6.73* 4.64*  --  7.09*  CALCIUM 8.7* 8.5*  --  8.7*  PHOS  --   --   --  4.9*     Liver Function Tests: Recent Labs  Lab 08/05/21 0922  ALBUMIN 2.5*    No results for input(s): LIPASE, AMYLASE in the last 168 hours. No results for input(s): AMMONIA in the last 168 hours.  CBC: Recent Labs  Lab 07/31/21 0329 08/01/21 0333 08/05/21 0922  WBC 16.2* 14.1* 14.6*  HGB 7.3* 7.7* 8.7*  HCT 24.0* 25.4* 28.3*  MCV 94.9 96.2 94.0  PLT 284 277 291     Cardiac Enzymes: No results for input(s): CKTOTAL, CKMB,  CKMBINDEX, TROPONINI in the last 168 hours.  BNP: Invalid input(s): POCBNP  CBG: Recent Labs  Lab 08/04/21 2211 08/05/21 0735 08/05/21 1628 08/05/21 2139 08/06/21 0754  GLUCAP 78 65* 130* 143* 91     Microbiology: Results for orders placed or performed during the hospital encounter of 07/21/21  Culture, blood (Routine X 2) w Reflex to ID Panel     Status: None   Collection Time: 07/22/21  2:44 PM   Specimen: Right Antecubital; Blood  Result Value Ref Range Status   Specimen Description RIGHT ANTECUBITAL  Final   Special Requests   Final    BOTTLES DRAWN AEROBIC AND ANAEROBIC Blood Culture adequate volume   Culture   Final    NO GROWTH 5 DAYS Performed at [facility], [address], [place], [place] [address]    Report Status 07/27/2021 FINAL  Final  Culture, blood (Routine X 2) w Reflex to ID Panel     Status: None   Collection Time: 07/22/21  2:46 PM   Specimen: BLOOD RIGHT HAND  Result Value Ref Range Status   Specimen Description BLOOD RIGHT HAND  Final   Special Requests   Final    BOTTLES DRAWN AEROBIC AND ANAEROBIC Blood Culture adequate volume   Culture  Final    NO GROWTH 5 DAYS Performed at [facility], [address] [place], [place] [address]    Report Status 07/27/2021 FINAL  Final  MRSA Next Gen by PCR, Nasal     Status: None   Collection Time: 07/27/21 12:18 PM   Specimen: Nasal Mucosa; Nasal Swab  Result Value Ref Range Status   MRSA by PCR Next Gen NOT DETECTED NOT DETECTED Final    Comment: (NOTE) The GeneXpert MRSA Assay (FDA approved for NASAL specimens only), is one component of a comprehensive MRSA colonization surveillance program. It is not intended to diagnose MRSA infection nor to guide or monitor treatment for MRSA infections. Test performance is not FDA approved in patients less than 63 years old. Performed at [facility], [address] [place], [place] [address]   Culture, blood (Routine X 2) w  Reflex to ID Panel     Status: None   Collection Time: 07/27/21 12:31 PM   Specimen: BLOOD  Result Value Ref Range Status   Specimen Description BLOOD RIGHT ANTECUBITAL  Final   Special Requests   Final    BOTTLES DRAWN AEROBIC AND ANAEROBIC Blood Culture adequate volume   Culture   Final    NO GROWTH 5 DAYS Performed at [facility], [address], [place], [place] [address]    Report Status 08/01/2021 FINAL  Final  Culture, blood (Routine X 2) w Reflex to ID Panel     Status: None   Collection Time: 07/27/21 12:33 PM   Specimen: BLOOD  Result Value Ref Range Status   Specimen Description BLOOD BLOOD RIGHT HAND  Final   Special Requests   Final    BOTTLES DRAWN AEROBIC AND ANAEROBIC Blood Culture adequate volume   Culture   Final    NO GROWTH 5 DAYS Performed at [facility], [address], [place], [place] [address]    Report Status 08/01/2021 FINAL  Final  Urine Culture     Status: None   Collection Time: 07/28/21  5:46 PM   Specimen: Urine, Clean Catch  Result Value Ref Range Status   Specimen Description   Final    URINE, CLEAN CATCH Performed at [facility], [address] [place], [place] [address]    Special Requests   Final    NONE Performed at [facility], [address], [place], [place] [address]    Culture   Final    NO GROWTH Performed at [facility], [place] [address], [place], [place] [address]    Report Status 07/30/2021 FINAL  Final  Aerobic Culture w Gram Stain (superficial specimen)     Status: None   Collection Time: 07/30/21  6:26 PM   Specimen: Wound  Result Value Ref Range Status   Specimen Description   Final    WOUND Performed at [facility], [address], [place], [place] [address]    Special Requests   Final    THIGH Performed at [facility], [address] [place], [place] [address]    Gram Stain   Final    FEW WBC PRESENT, PREDOMINANTLY PMN FEW SQUAMOUS  EPITHELIAL CELLS PRESENT NO ORGANISMS SEEN    Culture   Final    NO GROWTH 3 DAYS Performed at [facility], [place] [address], [place], [place] [address]    Report Status 08/02/2021 FINAL  Final  Expectorated Sputum Assessment w Gram Stain, Rflx to Resp Cult  Status: None   Collection Time: 07/31/21  4:18 PM   Specimen: Sputum  Result Value Ref Range Status   Specimen Description SPUTUM  Final   Special Requests NONE  Final   Sputum evaluation   Final    THIS SPECIMEN IS ACCEPTABLE FOR SPUTUM CULTURE Performed at [facility], [address] [place], [place] [address]    Report Status 07/31/2021 FINAL  Final  Culture, Respiratory w Gram Stain     Status: None   Collection Time: 07/31/21  4:18 PM   Specimen: SPU  Result Value Ref Range Status   Specimen Description SPUTUM  Final   Special Requests NONE Reflexed from [id number]  Final   Gram Stain   Final    NO SQUAMOUS EPITHELIAL CELLS SEEN RARE WBC SEEN RARE GRAM POSITIVE RODS    Culture RARE CANDIDA ALBICANS  Final   Report Status 08/03/2021 FINAL  Final    Coagulation Studies: No results for input(s): LABPROT, INR in the last 72 hours.  Urinalysis: No results for input(s): COLORURINE, LABSPEC, PHURINE, GLUCOSEU, HGBUR, BILIRUBINUR, KETONESUR, PROTEINUR, UROBILINOGEN, NITRITE, LEUKOCYTESUR in the last 72 hours.  Invalid input(s): APPERANCEUR     Imaging: CT HIP LEFT WO CONTRAST  Result Date: 08/06/2021 CLINICAL DATA:  Persistent left hip pain. Evaluate healing of left hip fracture. EXAM: CT OF THE LEFT HIP WITHOUT CONTRAST TECHNIQUE: Multidetector CT imaging of the left hip was performed according to the standard protocol. Multiplanar CT image reconstructions were also generated. RADIATION DOSE REDUCTION: This exam was performed according to the departmental dose-optimization program which includes automated exposure control, adjustment of the mA and/or kV according to patient size and/or use of iterative  reconstruction technique. COMPARISON:  Radiographs 07/29/2021 and MRI left hip 07/29/2021 FINDINGS: As demonstrated on the recent MRI examination there are ununited left hip fractures with extensive lucency around the hip hardware and extensive abnormal fluid collections around the hip consistent with septic arthritis and osteomyelitis. Focal posterior fluid collection measures 6 cm and there appears to be a connecting sinus tract to the skin. Associated myofasciitis and cellulitis. The acetabulum appears intact. The left hemipelvic bony structures are also intact. No significant intrapelvic abnormalities are identified. Borderline pelvic and enlarged inguinal lymph nodes again demonstrated. IMPRESSION: 1. Ununited left hip fractures with extensive lucency around the hip hardware consistent with extensive osteomyelitis. 2. 6 cm posterior fluid collection/abscess appears to be a connecting with a sinus tract to the skin. 3. Probable septic arthritis, myofasciitis and cellulitis. 4. Associated myofasciitis and cellulitis. Electronically Signed   By: Marijo Sanes M.D.   On: 08/06/2021 08:58     Medications:    meropenem (MERREM) IV 500 mg (08/05/21 2206)    amitriptyline  25 mg Oral QHS   amLODipine  5 mg Oral Daily   atorvastatin  40 mg Oral q1800   carvedilol  6.25 mg Oral BID   Chlorhexidine Gluconate Cloth  6 each Topical Q0600   citalopram  20 mg Oral Daily   DULoxetine  60 mg Oral Daily   epoetin (EPOGEN/PROCRIT) injection  10,000 Units Intravenous Q M,W,F-HD   insulin aspart  0-5 Units Subcutaneous QHS   insulin aspart  0-9 Units Subcutaneous TID WC   insulin aspart  3 Units Subcutaneous TID WC   insulin glargine-yfgn  15 Units Subcutaneous QHS   pantoprazole  40 mg Oral Daily   sevelamer carbonate  1,600 mg Oral TID WC   cyclobenzaprine, HYDROmorphone (DILAUDID) injection, nicotine, ondansetron **OR** ondansetron (ZOFRAN) IV,  oxyCODONE-acetaminophen, polyethylene glycol  Assessment/  Plan:  Ms. DEANETTE TULLIUS is a 56 y.o.  female with past medical history including hyperlipidemia, hypertension, left BKA, diabetes, chronic pain syndrome, and end-stage renal disease on hemodialysis, who was admitted to [facility] on 07/21/2021 for Acidosis [E87.20] Hyperkalemia [E87.5] Elevated BUN [R79.9] ESRD (end stage renal disease) on dialysis ([place]) [N18.6, Z99.2] Femur fracture, left (HCC) [S72.92XA] Closed fracture of left femur, unspecified fracture morphology, unspecified portion of femur, initial encounter ([place]) [S72.92XA] Closed fracture of left distal femur ([place]) [S72.402A]  [facility]/MWF/left aVF/83 kg  End-stage renal disease on dialysis.  Will maintain outpatient schedule if possible.    -Received dialysis yesterday, UF 1L achieved. Next treatment scheduled for Wednesday.  - Renal navigator aware of transfer to [facility] health and will release chair at outpatient clinic. If needed, a outpatient clinic can be arranged prior to discharge.   2. Anemia of chronic kidney disease Normocytic Lab Results  Component Value Date   HGB 8.7 (L) 08/05/2021   Receiving EPO with dialysis. Will transition to [facility] as outpatient.  3. Secondary Hyperparathyroidism:  Lab Results  Component Value Date   PTH 44 01/11/2020   PTH Comment 01/11/2020   CALCIUM 8.7 (L) 08/05/2021   CAION 1.10 (L) 04/07/2017   PHOS 4.9 (H) 08/05/2021   Continue Renvela with meals.   4.  Hypertension with chronic kidney disease.  Continue amlodipine and carvedilol. Blood pressure currently 117/59  5. Diabetes mellitus type II with chronic kidney disease: insulin dependent. Home regimen includes to send NovoLog 70/30. Most recent hemoglobin A1c is 7.1%  on 07/22/21.          -Glucose stable, SSI managed by primary team.  6.  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 -Evaluated by orthopedics, too complex for surgery. -Currently on meropenem  for healthcare associated pneumonia -Chronic antibiotic suppression recommended by orthopedics.  -Will receive second opinion from [facility] orthopedist.    LOS: 15 Jahlen Bollman 6/6/202311:25 AM

## 2021-08-06 NOTE — Assessment & Plan Note (Signed)
   Orthopedic service recommends no surgery   Orthopedic service recommends brace in flexion of the left knee, nonweightbearing  Patient to follow-up outpatient with orthopedic provider in 1 week after discharge  rehab/SNF placement

## 2021-08-06 NOTE — Progress Notes (Signed)
OT Cancellation Note  Patient Details Name: [name] MRN: [id number] DOB: Nov 14, 1965   Cancelled Treatment:    Reason Eval/Treat Not Completed: Other (comment). Per chart review, pt transferring to Home today.   Darleen Crocker, MS, OTR/L , CBIS ascom [phone]  08/06/21, 11:19 AM

## 2021-08-06 NOTE — Progress Notes (Signed)
Cancelling referral to [facility], due to patient transferring to [facility]. Sending referral back to patient home clinic [place].

## 2021-08-06 NOTE — Progress Notes (Signed)
Spoke with [facility], still waiting for bed. There are 4-5 people ahead of her.

## 2021-08-06 NOTE — Assessment & Plan Note (Signed)
Drained copious purulent fluid, sent for wound culture and it remains negative. Repeat CT hip which was ordered by her orthopedic surgeon with abscess surrounding hardware and sinus tract to skin -Continue with wound care

## 2021-08-06 NOTE — Progress Notes (Signed)
Progress Note   Patient: [name] QVZ:[id number] DOB: 1965/05/07 DOA: 07/21/2021     15 DOS: the patient was seen and examined on 08/06/2021   Brief hospital course: [name] is a 56 year old female with history of hypertension, hyperlipidemia, end-stage renal disease on HD MWF, history of left BKA in 2021, depression, anxiety, insulin-dependent diabetes mellitus, insomnia, chronic pain syndrome, who presents emergency department for chief concerns of pain from falling during transfer from wheelchair to recliner about 1 week ago. She missed dialysis for whole week.  Upon arriving the hospital, she was found to have a distal left femur fracture, deemed no surgery by orthopedics.  But no weightbearing.  Patient is also started on dialysis.   Was waiting on patient to decide on SNF facility for rehab since she was initially advised inpatient rehab but later stated she did not think she would be able to participate in intensive therapy. Pending dialysis spot but SNF placement was confirmed. Discharge was not considered however as WBC had been trending up, workup showed multifocal pneumonia on CXR despite no significant symptoms, started abx 07/27/21, obtained UA for completeness (still makes some urine and this was obtained 05/28 and abnormal), Urine Cx, sputum Cx, blood Cx are all negative.  Also 05/28 drained superficial abscess L hip and sent for culture which was also negative.  d/w ortho 05/29 and obtaining XR and MRI to eval for L hip septic arthritis. 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 05/30 Ortho recommended I&D and hardware removal and place abx beads but pt does not want to proceed w/ surgery despite extensive discussion risk/benefit. ID consulted.   5/31: Had a long discussion with Ortho.  Patient will be very high risk for any surgical intervention with concern of nonhealing wound and  worsening of her underlying comorbidities.  Most likely will have infection recurrent.  They also does not want any incision and drainage as requested by ID to clean the pockets stating that this infection will soon recur as she will need a very extensive debridement. Patient initially received cefepime which was later switched with meropenem based on her prior history of resistant Enterobacter and bacteroids. This is a very unfortunate situation, no definitive solution at this time.  6/1: Patient continued to experience significant pain in left leg.  She was to see her on orthopedic surgeon regarding her new fracture and continuation of infection.  6/2: Patient will be going for percutaneous abscess drainage with IR today. Nursing concern of dark-colored urine-UA ordered to rule out hematuria.  Most likely concentrated urine as patient is ESRD. CBG remained elevated-increase the dose of seemegle and switch her to moderate SSI Patient now had outpatient chair for dialysis, will be discharged once cleared from infectious disease.  6/3: declined CT guided Drainage procedure. Still considering surgical opinion from primary orthopedist.   6/5: Episode of becoming hypoglycemic, CBG at 65 requiring intervention.  Decrease Semglee to 15 twice daily. Dr. Lyla Martinez accepted her at [facility].  Patient only want her on orthopedic surgeon, Dr. Lyla Martinez to make decisions regarding her infected hardware and a new fracture. Patient will be transferred to [facility] for further management. She will be going under hospitalist services, please consult Dr. Lyla Martinez or his PA once patient is there.  6/6: Patient still waiting for transfer.  CBG at 91 in the morning.  Patient received only 1 dose of Semglee last night.  No additional insulin  was given.  CBG now remained within goal and becoming hypoglycemic at times.  Decrease Semglee to nightly, decrease mealtime coverage to 3 units and make sliding scale  sensitive. Repeat CT hip was done by Dr. Lyla Martinez and it shows 1. Ununited left hip fractures with extensive lucency around the hip hardware consistent with extensive osteomyelitis. 2. 6 cm posterior fluid collection/abscess appears to be a connecting with a sinus tract to the skin. 3. Probable septic arthritis, myofasciitis and cellulitis. 4. Associated myofasciitis and cellulitis.    Assessment and Plan: * Femur fracture, left [facility])  Orthopedic service recommends no surgery   Orthopedic service recommends brace in flexion of the left knee, nonweightbearing  Patient to follow-up outpatient with orthopedic provider in 1 week after discharge  rehab/SNF placement   Septic arthritis of hip ([place]) Superficial abscess described as recurrent since previous surgery, drained at bedside 05/28 and obtained XR and MRI - concerning for 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   05/30 Ortho recommended I&D and hardware removal and place abx beads but pt does not want to proceed w/ surgery despite extensive discussion risk/benefit.   ID consulted 05/30  Cultures from L hip abscess obtained 05/28 remain negative.  ID switched antibiotics to meropenem based on prior culture results where she grew resistant Enterobacter and bacteroids.  Another discussion with orthopedic and they are not going to perform any procedure as risk is very more than benefit.  Patient also does not want any more procedures.  -Continue with meropenem, might need long-term suppressive antibiotics on discharge, appreciate ID help  Patient is being transferred to [facility] so her on orthopedic surgeon can be involved in her further management  Skin abscess L hip Drained copious purulent fluid, sent for wound culture and it remains negative. Repeat CT hip which was ordered by her orthopedic surgeon with abscess surrounding hardware and sinus tract to  skin -Continue with wound care  HCAP (healthcare-associated pneumonia) WBC trending up, w/u reveals pneumonia on CXR despite no significant symptoms though pt does have smoker's cough, no SOB. Poorly controlled diabetes and pt's lack of effort w/ mobilization/inceptive spirometry likely contributing.   Not much upper respiratory symptoms and she completed the required course for presumed pneumonia with cefepime and Zithromax  UTI (urinary tract infection) UA able to be collected 07/28/21, and remain negative  Patient received antibiotics for concern of pneumonia and left septic hip  Type 2 diabetes mellitus with renal complication (HCC) CBG at 91 this morning.  Currently is staying within goal with some episodes of hypoglycemia intermittently. -Decrease Semglee to 15 units nightly -Decrease mealtime coverage to 3 units -Make sliding scale sensitive -Continue to monitor  ESRD (end stage renal disease) on dialysis [facility])  Nephrology following.  Patient need a new confirmed seat before discharge to rehab  TOBACCO DEPENDENCE - Nicotine patch as needed for nicotine craving   Anxiety and depression  Amitriptyline 25 mg nightly, Celexa 20 mg daily, duloxetine 60 mg daily  Essential hypertension  Amlodipine 5 mg daily, carvedilol 6.25 mg p.o. twice daily  Hydralazine 5 mg IV every 6 hours.  For SBP greater than 180,   Hyponatremia Corrected sodium at 132.  Most likely pseudohyponatremia secondary to hyperglycemia. -Continue to monitor  Hyperkalemia Resolved  Monitor bmp  Leukocytosis CXR (+) multifocal pneumonia, UA abnormal, also w/u for septic arthritis   Essential hypertension, benign-resolved as of 07/27/2021 - Patient takes amlodipine 5 mg daily, carvedilol 6.25  mg p.o. twice daily, these have been resumed for 07/22/2021 due to low normotensive in setting of requiring hemodialysis - Hydralazine 100 mg 3 times daily have not been resumed due to the same      Subjective: Patient was complaining about dark-colored urine which seems concentrated.  She was recalled to her nephrologist despite reassurance that in dialysis patient they normally have concentrated urine.  She was also frustrated that transfer has not happened yet.  Physical Exam: Vitals:   08/05/21 2024 08/06/21 0542 08/06/21 0755 08/06/21 1131  BP: 98/83 (!) 126/54 (!) 143/55 (!) 117/59  Pulse: 66 61 61 61  Resp: 20 20 16 16   Temp: 98.4 F (36.9 C) 97.8 F (36.6 C) 98.2 F (36.8 C) (!) 97.5 F (36.4 C)  TempSrc:      SpO2: 97% 99% 99% 99%  Weight:      Height:       General.     In no acute distress. Pulmonary.  Lungs clear bilaterally, normal respiratory effort. CV.  Regular rate and rhythm, no JVD, rub or murmur. Abdomen.  Soft, nontender, nondistended, BS positive. CNS.  Alert and oriented .  No focal neurologic deficit. Extremities.  Left BKA, left knee with splint and left hip with soiled bandage Psychiatry.  Judgment and insight appears normal.  Data Reviewed: Prior notes, labs and images reviewed  Family Communication: With daughter  Disposition: Status is: Inpatient Remains inpatient appropriate because: Severity of illness.   Planned Discharge Destination: Waiting for transfer to [facility]  Time spent: 45 minutes  This record has been created using Systems analyst. Errors have been sought and corrected,but may not always be located. Such creation errors do not reflect on the standard of care.  Author: Lorella Nimrod, MD 08/06/2021 2:34 PM  For on call review [email or web address].

## 2021-08-06 NOTE — Plan of Care (Signed)
  Problem: Education: Goal: Knowledge of General Education information will improve Description: Including pain rating scale, medication(s)/side effects and non-pharmacologic comfort measures Outcome: Progressing   Problem: Health Behavior/Discharge Planning: Goal: Ability to manage health-related needs will improve Outcome: Progressing   Problem: Clinical Measurements: Goal: Will remain free from infection Outcome: Progressing   Problem: Clinical Measurements: Goal: Diagnostic test results will improve Outcome: Progressing   Problem: Clinical Measurements: Goal: Respiratory complications will improve Outcome: Progressing   Problem: Clinical Measurements: Goal: Cardiovascular complication will be avoided Outcome: Progressing   Problem: Activity: Goal: Risk for activity intolerance will decrease Outcome: Progressing   Problem: Nutrition: Goal: Adequate nutrition will be maintained Outcome: Progressing   Problem: Coping: Goal: Level of anxiety will decrease Outcome: Progressing   Problem: Elimination: Goal: Will not experience complications related to bowel motility Outcome: Progressing   Problem: Elimination: Goal: Will not experience complications related to urinary retention Outcome: Progressing   Problem: Pain Managment: Goal: General experience of comfort will improve Outcome: Progressing   Problem: Safety: Goal: Ability to remain free from injury will improve Outcome: Progressing   Problem: Skin Integrity: Goal: Risk for impaired skin integrity will decrease Outcome: Progressing   Problem: Fluid Volume: Goal: Ability to maintain a balanced intake and output will improve Outcome: Progressing   Problem: Health Behavior/Discharge Planning: Goal: Ability to identify and utilize available resources and services will improve Outcome: Progressing   Problem: Metabolic: Goal: Ability to maintain appropriate glucose levels will improve Outcome:  Progressing   Problem: Nutritional: Goal: Maintenance of adequate nutrition will improve Outcome: Progressing   Problem: Skin Integrity: Goal: Risk for impaired skin integrity will decrease Outcome: Progressing

## 2021-08-06 NOTE — Progress Notes (Signed)
Physical Therapy Treatment Patient Details Name: [name] MRN: [id number] DOB: [birth year] Today's Date: 08/06/2021   History of Present Illness Pt is a 56 y/o F admitted on 07/21/21 after presenting to ED for c/c of pain from falling during transfer from w/c to recliner ~1 week ago & pt missed dialysis for a whole week. Pt found to have distal L femur fx & deemed no surgery by orthopedics but NWB LLE. PMH: HTN, HLD, ESRD on HD MWF, L BKA in 2021, depression, anxiety, insulin-dependent DM, insomnia, chronic pain syndromes    PT Comments    Pt initially declined to participate with PT services but with encouragement and education on benefits of activity pt agreed to supine therex.  Pt continued to decline mobility training.  Pt put forth good effort with below exercises with SpO2 and HR WNL and with no adverse symptoms noted other than LLE pain that did not worsen during the session.  Pt will benefit from PT services in a SNF setting upon discharge to safely address deficits listed in patient problem list for decreased caregiver assistance and eventual return to PLOF.     Recommendations for follow up therapy are one component of a multi-disciplinary discharge planning process, led by the attending physician.  Recommendations may be updated based on patient status, additional functional criteria and insurance authorization.  Follow Up Recommendations  Skilled nursing-short term rehab (<3 hours/day)     Assistance Recommended at Discharge Frequent or constant Supervision/Assistance  Patient can return home with the following A lot of help with walking and/or transfers;A lot of help with bathing/dressing/bathroom;Assistance with cooking/housework;Direct supervision/assist for medications management;Direct supervision/assist for financial management;Assist for transportation;Help with stairs or ramp for entrance   Equipment Recommendations  Other (comment) (TBD)    Recommendations for Other  Services       Precautions / Restrictions Precautions Precautions: Fall Restrictions Weight Bearing Restrictions: Yes LLE Weight Bearing: Non weight bearing Other Position/Activity Restrictions: pt also with chronic L BKA/infection     Mobility  Bed Mobility               General bed mobility comments: Pt declined all but supine therex    Transfers                        Ambulation/Gait                   Stairs             Wheelchair Mobility    Modified Rankin (Stroke Patients Only)       Balance                                            Cognition Arousal/Alertness: Awake/alert Behavior During Therapy: WFL for tasks assessed/performed Overall Cognitive Status: Within Functional Limits for tasks assessed                                          Exercises Total Joint Exercises Ankle Circles/Pumps: Right, Strengthening, AROM, Both, 5 reps, 10 reps (with manual resistance) Quad Sets: Strengthening, Right, 10 reps Gluteal Sets: Strengthening, Both, 10 reps Heel Slides: Strengthening, Right, 10 reps (with manual resistance) Hip ABduction/ADduction: AAROM, Strengthening, Right, 10 reps Straight Leg Raises:  AAROM, Strengthening, Right, 10 reps Bridges: Right, Strengthening, 10 reps (minimal amplitude, unable to clear surface of bed) Other Exercises Other Exercises: HEP education for RLE APs and QS and BLE GS    General Comments        Pertinent Vitals/Pain Pain Assessment Pain Assessment: 0-10 Pain Score: 8  Pain Location: LLE Pain Descriptors / Indicators: Discomfort, Sore Pain Intervention(s): Premedicated before session, Monitored during session    Home Living                          Prior Function            PT Goals (current goals can now be found in the care plan section) Progress towards PT goals: PT to reassess next treatment    Frequency    Min  2X/week      PT Plan Current plan remains appropriate    Co-evaluation              AM-PAC PT "6 Clicks" Mobility   Outcome Measure  Help needed turning from your back to your side while in a flat bed without using bedrails?: A Lot Help needed moving from lying on your back to sitting on the side of a flat bed without using bedrails?: A Little Help needed moving to and from a bed to a chair (including a wheelchair)?: Total Help needed standing up from a chair using your arms (e.g., wheelchair or bedside chair)?: Total Help needed to walk in hospital room?: Total Help needed climbing 3-5 steps with a railing? : Total 6 Click Score: 9    End of Session   Activity Tolerance: Patient tolerated treatment well Patient left: in bed;with call bell/phone within reach;with bed alarm set Nurse Communication: Mobility status PT Visit Diagnosis: Other abnormalities of gait and mobility (R26.89);Muscle weakness (generalized) (M62.81);History of falling (Z91.81);Pain Pain - Right/Left: Left Pain - part of body: Leg     Time: 1610-9604 PT Time Calculation (min) (ACUTE ONLY): 17 min  Charges:  $Therapeutic Exercise: 8-22 mins                     D. Scott Meranda Dechaine PT, DPT 08/06/21, 3:12 PM

## 2021-08-06 NOTE — Assessment & Plan Note (Signed)
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   05/30 Ortho recommended I&D and hardware removal and place abx beads but pt does not want to proceed w/ surgery despite extensive discussion risk/benefit.   ID consulted 05/30  Cultures from L hip abscess obtained 05/28 remain negative.  ID switched antibiotics to meropenem based on prior culture results where she grew resistant Enterobacter and bacteroids.  Another discussion with orthopedic and they are not going to perform any procedure as risk is very more than benefit.  Patient also does not want any more procedures.  -Continue with meropenem, might need long-term suppressive antibiotics on discharge, appreciate ID help  Patient is being transferred to [facility] so her on orthopedic surgeon can be involved in her further management

## 2021-08-07 ENCOUNTER — Other Ambulatory Visit: Payer: Self-pay

## 2021-08-07 ENCOUNTER — Inpatient Hospital Stay (HOSPITAL_COMMUNITY)
Admission: AD | Admit: 2021-08-07 | Discharge: 2021-08-23 | DRG: 480 | Disposition: A | Discharge status: Skilled Nursing Facility | Payer: Medicare Other | Source: Other Acute Inpatient Hospital | Attending: Family Medicine | Admitting: Family Medicine

## 2021-08-07 DIAGNOSIS — T847XXA Infection and inflammatory reaction due to other internal orthopedic prosthetic devices, implants and grafts, initial encounter: Secondary | ICD-10-CM | POA: Diagnosis not present

## 2021-08-07 DIAGNOSIS — N2581 Secondary hyperparathyroidism of renal origin: Secondary | ICD-10-CM | POA: Diagnosis present

## 2021-08-07 DIAGNOSIS — M86252 Subacute osteomyelitis, left femur: Secondary | ICD-10-CM | POA: Diagnosis present

## 2021-08-07 DIAGNOSIS — T847XXS Infection and inflammatory reaction due to other internal orthopedic prosthetic devices, implants and grafts, sequela: Secondary | ICD-10-CM | POA: Diagnosis not present

## 2021-08-07 DIAGNOSIS — M00059 Staphylococcal arthritis, unspecified hip: Secondary | ICD-10-CM | POA: Diagnosis not present

## 2021-08-07 DIAGNOSIS — E1040 Type 1 diabetes mellitus with diabetic neuropathy, unspecified: Secondary | ICD-10-CM | POA: Diagnosis present

## 2021-08-07 DIAGNOSIS — W1830XA Fall on same level, unspecified, initial encounter: Secondary | ICD-10-CM | POA: Diagnosis present

## 2021-08-07 DIAGNOSIS — Z794 Long term (current) use of insulin: Secondary | ICD-10-CM

## 2021-08-07 DIAGNOSIS — S72492A Other fracture of lower end of left femur, initial encounter for closed fracture: Secondary | ICD-10-CM | POA: Diagnosis not present

## 2021-08-07 DIAGNOSIS — L089 Local infection of the skin and subcutaneous tissue, unspecified: Secondary | ICD-10-CM | POA: Diagnosis present

## 2021-08-07 DIAGNOSIS — Z8673 Personal history of transient ischemic attack (TIA), and cerebral infarction without residual deficits: Secondary | ICD-10-CM | POA: Diagnosis not present

## 2021-08-07 DIAGNOSIS — Z83438 Family history of other disorder of lipoprotein metabolism and other lipidemia: Secondary | ICD-10-CM

## 2021-08-07 DIAGNOSIS — E1129 Type 2 diabetes mellitus with other diabetic kidney complication: Secondary | ICD-10-CM | POA: Diagnosis present

## 2021-08-07 DIAGNOSIS — F32A Depression, unspecified: Secondary | ICD-10-CM | POA: Diagnosis present

## 2021-08-07 DIAGNOSIS — Z833 Family history of diabetes mellitus: Secondary | ICD-10-CM

## 2021-08-07 DIAGNOSIS — E871 Hypo-osmolality and hyponatremia: Secondary | ICD-10-CM | POA: Diagnosis present

## 2021-08-07 DIAGNOSIS — S72452A Displaced supracondylar fracture without intracondylar extension of lower end of left femur, initial encounter for closed fracture: Secondary | ICD-10-CM | POA: Diagnosis present

## 2021-08-07 DIAGNOSIS — Z792 Long term (current) use of antibiotics: Secondary | ICD-10-CM

## 2021-08-07 DIAGNOSIS — S72412A Displaced unspecified condyle fracture of lower end of left femur, initial encounter for closed fracture: Secondary | ICD-10-CM | POA: Diagnosis present

## 2021-08-07 DIAGNOSIS — M00852 Arthritis due to other bacteria, left hip: Secondary | ICD-10-CM | POA: Diagnosis not present

## 2021-08-07 DIAGNOSIS — S72002A Fracture of unspecified part of neck of left femur, initial encounter for closed fracture: Secondary | ICD-10-CM | POA: Diagnosis present

## 2021-08-07 DIAGNOSIS — E785 Hyperlipidemia, unspecified: Secondary | ICD-10-CM | POA: Diagnosis present

## 2021-08-07 DIAGNOSIS — D638 Anemia in other chronic diseases classified elsewhere: Secondary | ICD-10-CM | POA: Diagnosis present

## 2021-08-07 DIAGNOSIS — T847XXD Infection and inflammatory reaction due to other internal orthopedic prosthetic devices, implants and grafts, subsequent encounter: Secondary | ICD-10-CM | POA: Diagnosis not present

## 2021-08-07 DIAGNOSIS — E669 Obesity, unspecified: Secondary | ICD-10-CM | POA: Diagnosis present

## 2021-08-07 DIAGNOSIS — E1122 Type 2 diabetes mellitus with diabetic chronic kidney disease: Secondary | ICD-10-CM | POA: Diagnosis not present

## 2021-08-07 DIAGNOSIS — E1022 Type 1 diabetes mellitus with diabetic chronic kidney disease: Secondary | ICD-10-CM | POA: Diagnosis present

## 2021-08-07 DIAGNOSIS — L0291 Cutaneous abscess, unspecified: Secondary | ICD-10-CM | POA: Diagnosis present

## 2021-08-07 DIAGNOSIS — M009 Pyogenic arthritis, unspecified: Secondary | ICD-10-CM | POA: Diagnosis present

## 2021-08-07 DIAGNOSIS — L0390 Cellulitis, unspecified: Secondary | ICD-10-CM | POA: Diagnosis present

## 2021-08-07 DIAGNOSIS — I120 Hypertensive chronic kidney disease with stage 5 chronic kidney disease or end stage renal disease: Secondary | ICD-10-CM | POA: Diagnosis present

## 2021-08-07 DIAGNOSIS — T8452XA Infection and inflammatory reaction due to internal left hip prosthesis, initial encounter: Principal | ICD-10-CM | POA: Diagnosis present

## 2021-08-07 DIAGNOSIS — G4700 Insomnia, unspecified: Secondary | ICD-10-CM | POA: Diagnosis present

## 2021-08-07 DIAGNOSIS — G894 Chronic pain syndrome: Secondary | ICD-10-CM | POA: Diagnosis present

## 2021-08-07 DIAGNOSIS — L02416 Cutaneous abscess of left lower limb: Secondary | ICD-10-CM | POA: Diagnosis present

## 2021-08-07 DIAGNOSIS — S72352A Displaced comminuted fracture of shaft of left femur, initial encounter for closed fracture: Secondary | ICD-10-CM | POA: Diagnosis not present

## 2021-08-07 DIAGNOSIS — Z89512 Acquired absence of left leg below knee: Secondary | ICD-10-CM

## 2021-08-07 DIAGNOSIS — D62 Acute posthemorrhagic anemia: Secondary | ICD-10-CM | POA: Diagnosis not present

## 2021-08-07 DIAGNOSIS — I10 Essential (primary) hypertension: Secondary | ICD-10-CM | POA: Diagnosis not present

## 2021-08-07 DIAGNOSIS — N186 End stage renal disease: Secondary | ICD-10-CM | POA: Diagnosis present

## 2021-08-07 DIAGNOSIS — L02818 Cutaneous abscess of other sites: Secondary | ICD-10-CM | POA: Diagnosis not present

## 2021-08-07 DIAGNOSIS — Z992 Dependence on renal dialysis: Secondary | ICD-10-CM

## 2021-08-07 DIAGNOSIS — F419 Anxiety disorder, unspecified: Secondary | ICD-10-CM | POA: Diagnosis present

## 2021-08-07 DIAGNOSIS — Z91040 Latex allergy status: Secondary | ICD-10-CM

## 2021-08-07 DIAGNOSIS — T82868A Thrombosis of vascular prosthetic devices, implants and grafts, initial encounter: Secondary | ICD-10-CM | POA: Diagnosis not present

## 2021-08-07 DIAGNOSIS — M609 Myositis, unspecified: Secondary | ICD-10-CM | POA: Diagnosis present

## 2021-08-07 DIAGNOSIS — T82898A Other specified complication of vascular prosthetic devices, implants and grafts, initial encounter: Secondary | ICD-10-CM | POA: Diagnosis not present

## 2021-08-07 DIAGNOSIS — Z79899 Other long term (current) drug therapy: Secondary | ICD-10-CM

## 2021-08-07 DIAGNOSIS — S72402A Unspecified fracture of lower end of left femur, initial encounter for closed fracture: Secondary | ICD-10-CM | POA: Diagnosis present

## 2021-08-07 DIAGNOSIS — K219 Gastro-esophageal reflux disease without esophagitis: Secondary | ICD-10-CM | POA: Diagnosis present

## 2021-08-07 DIAGNOSIS — M00859 Arthritis due to other bacteria, unspecified hip: Secondary | ICD-10-CM | POA: Diagnosis not present

## 2021-08-07 DIAGNOSIS — N185 Chronic kidney disease, stage 5: Secondary | ICD-10-CM | POA: Diagnosis not present

## 2021-08-07 DIAGNOSIS — Z888 Allergy status to other drugs, medicaments and biological substances status: Secondary | ICD-10-CM

## 2021-08-07 DIAGNOSIS — Y831 Surgical operation with implant of artificial internal device as the cause of abnormal reaction of the patient, or of later complication, without mention of misadventure at the time of the procedure: Secondary | ICD-10-CM | POA: Diagnosis present

## 2021-08-07 DIAGNOSIS — Z8249 Family history of ischemic heart disease and other diseases of the circulatory system: Secondary | ICD-10-CM

## 2021-08-07 DIAGNOSIS — E1069 Type 1 diabetes mellitus with other specified complication: Secondary | ICD-10-CM | POA: Diagnosis present

## 2021-08-07 DIAGNOSIS — Z6833 Body mass index (BMI) 33.0-33.9, adult: Secondary | ICD-10-CM

## 2021-08-07 DIAGNOSIS — F17210 Nicotine dependence, cigarettes, uncomplicated: Secondary | ICD-10-CM | POA: Diagnosis present

## 2021-08-07 DIAGNOSIS — Z79891 Long term (current) use of opiate analgesic: Secondary | ICD-10-CM

## 2021-08-07 HISTORY — DX: Chronic kidney disease, unspecified: N18.9

## 2021-08-07 HISTORY — DX: Headache, unspecified: R51.9

## 2021-08-07 LAB — GLUCOSE, CAPILLARY
Glucose-Capillary: 73 mg/dL (ref 70–99)
Glucose-Capillary: 83 mg/dL (ref 70–99)
Glucose-Capillary: 93 mg/dL (ref 70–99)
Glucose-Capillary: 99 mg/dL (ref 70–99)

## 2021-08-07 MED ORDER — SODIUM CHLORIDE 0.9 % IV SOLN
500.0000 mg | INTRAVENOUS | Status: AC
  Administered 2021-08-08: 500 mg via INTRAVENOUS
  Filled 2021-08-07: qty 10

## 2021-08-07 MED ORDER — INSULIN GLARGINE-YFGN 100 UNIT/ML ~~LOC~~ SOLN: 10.0000 [IU] | Freq: Every day | SUBCUTANEOUS | Status: DC

## 2021-08-07 MED ORDER — ACETAMINOPHEN 650 MG RE SUPP: 650.0000 mg | Freq: Four times a day (QID) | RECTAL | Status: DC | PRN

## 2021-08-07 MED ORDER — INSULIN ASPART 100 UNIT/ML IJ SOLN
0.0000 [IU] | INTRAMUSCULAR | Status: DC
  Administered 2021-08-09: 1 [IU] via SUBCUTANEOUS
  Administered 2021-08-09 – 2021-08-10 (×2): 2 [IU] via SUBCUTANEOUS
  Administered 2021-08-10: 3 [IU] via SUBCUTANEOUS
  Administered 2021-08-10: 1 [IU] via SUBCUTANEOUS
  Administered 2021-08-11 (×3): 2 [IU] via SUBCUTANEOUS
  Administered 2021-08-11 – 2021-08-14 (×7): 1 [IU] via SUBCUTANEOUS
  Administered 2021-08-14: 2 [IU] via SUBCUTANEOUS
  Administered 2021-08-15 – 2021-08-16 (×5): 1 [IU] via SUBCUTANEOUS
  Administered 2021-08-16: 2 [IU] via SUBCUTANEOUS
  Administered 2021-08-17: 1 [IU] via SUBCUTANEOUS
  Administered 2021-08-17 (×2): 2 [IU] via SUBCUTANEOUS
  Administered 2021-08-17 – 2021-08-18 (×8): 1 [IU] via SUBCUTANEOUS
  Administered 2021-08-19: 2 [IU] via SUBCUTANEOUS
  Administered 2021-08-19: 3 [IU] via SUBCUTANEOUS
  Administered 2021-08-19: 1 [IU] via SUBCUTANEOUS
  Administered 2021-08-20: 3 [IU] via SUBCUTANEOUS
  Administered 2021-08-20: 2 [IU] via SUBCUTANEOUS
  Administered 2021-08-20: 1 [IU] via SUBCUTANEOUS
  Administered 2021-08-20: 2 [IU] via SUBCUTANEOUS
  Administered 2021-08-20 – 2021-08-21 (×4): 1 [IU] via SUBCUTANEOUS
  Administered 2021-08-21: 2 [IU] via SUBCUTANEOUS
  Administered 2021-08-21: 1 [IU] via SUBCUTANEOUS
  Administered 2021-08-22: 2 [IU] via SUBCUTANEOUS
  Administered 2021-08-22 (×4): 1 [IU] via SUBCUTANEOUS

## 2021-08-07 MED ORDER — INSULIN GLARGINE-YFGN 100 UNIT/ML ~~LOC~~ SOLN
10.0000 [IU] | Freq: Every day | SUBCUTANEOUS | Status: DC
  Filled 2021-08-07: qty 0.1

## 2021-08-07 MED ORDER — OXYCODONE HCL 5 MG PO TABS
5.0000 mg | ORAL_TABLET | Freq: Four times a day (QID) | ORAL | Status: DC | PRN
  Administered 2021-08-07 – 2021-08-09 (×5): 5 mg via ORAL
  Filled 2021-08-07 (×6): qty 1

## 2021-08-07 MED ORDER — MEROPENEM 500 MG IV SOLR
500.0000 mg | Freq: Once | INTRAVENOUS | Status: AC
  Administered 2021-08-07: 500 mg via INTRAVENOUS
  Filled 2021-08-07: qty 10

## 2021-08-07 MED ORDER — ACETAMINOPHEN 325 MG PO TABS
650.0000 mg | ORAL_TABLET | Freq: Four times a day (QID) | ORAL | Status: DC | PRN
  Administered 2021-08-08 – 2021-08-11 (×3): 650 mg via ORAL
  Filled 2021-08-07 (×4): qty 2

## 2021-08-07 MED ORDER — CHLORHEXIDINE GLUCONATE CLOTH 2 % EX PADS
6.0000 | MEDICATED_PAD | Freq: Every day | CUTANEOUS | Status: DC
  Administered 2021-08-08 – 2021-08-15 (×8): 6 via TOPICAL

## 2021-08-07 NOTE — Progress Notes (Signed)
Pt arrived to 5N 07. Alert and oriented x 4. Identified appropriately. VS stable, no signs of acute distress, pt denied chest pain and SOB. Admission notified about pt arrival to floor, and call back received by this RN. Pt oriented to room and equipment, instructed on how to use call bell.  Pt instructed to call for assistance, call bell within pt reach, and bed alarm turned on. Will continue to monitor pt.

## 2021-08-07 NOTE — Progress Notes (Signed)
Several attempts made to cannulate patient for dialysis treatment  without success. Colon Flattery, NP notified and reschedule treatment for tomorrow if patient is not discharge.

## 2021-08-07 NOTE — Progress Notes (Signed)
Patient has bed at [facility], verified by Otila Kluver at [place]. Room is 5 North 07. Report number is [phone], RN at [facility] cone was in a rapid at the time and will call back when possible. Transport by [employer]  at discharge, [phone].

## 2021-08-07 NOTE — Progress Notes (Signed)
Pharmacy Antibiotic Note  [name] is a 56 y.o. female admitted on 08/07/2021 with  Osteoarthritis  .  Pharmacy has been consulted for Merrem dosing.  Previously on meropenem per ID with history of enterobacter and bacteroides  Plan: Continue meropenem 500mg  q24hr (scheduled post iHD)  Will monitor for acute changes in renal function and adjust as needed F/u cultures results and de-escalate as appropriate   Height: 5\' 10"  (177.8 cm) Weight: 103 kg (227 lb 1.2 oz) IBW/kg (Calculated) : 68.5  Temp (24hrs), Avg:98 F (36.7 C), Min:97.8 F (36.6 C), Max:98.2 F (36.8 C)  Recent Labs  Lab 08/01/21 0333 08/05/21 0922  WBC 14.1* 14.6*  CREATININE 4.64* 7.09*    Estimated Creatinine Clearance: 11.8 mL/min (A) (by C-G formula based on SCr of 7.09 mg/dL (H)).    Allergies  Allergen Reactions   Trazodone Swelling   Latex Rash   Lidocaine Itching    Thank you for allowing pharmacy to be a part of this patient's care.  Donnald Garre, PharmD Clinical Pharmacist  Please check AMION for all [place] numbers After 10:00 PM, call [place] [phone]

## 2021-08-07 NOTE — Plan of Care (Signed)

## 2021-08-07 NOTE — Progress Notes (Signed)
Inpatient Diabetes Program Recommendations  AACE/ADA: New Consensus Statement on Inpatient Glycemic Control   Target Ranges:  Prepandial:   less than 140 mg/dL      Peak postprandial:   less than 180 mg/dL (1-2 hours)      Critically ill patients:  140 - 180 mg/dL    Latest Reference Range & Units 08/06/21 07:54 08/06/21 11:30 08/06/21 16:13 08/06/21 21:28 08/07/21 07:50  Glucose-Capillary 70 - 99 mg/dL 91 143 (H) 106 (H) 127 (H) 73   Review of Glycemic Control  Diabetes history: DM2 Outpatient Diabetes medications:  Lantus 12 units QHS, 70/30 10 units TID with meals Current orders for Inpatient glycemic control: Semglee 15 units QHS, Novolog 0-9 units TID, Novolog 0-5 units QHS, Novolog 3 units TID with meals  Inpatient Diabetes Program Recommendations:    Insulin: Please consider decreasing Semglee to 12 units QHS.  Thanks, Barnie Alderman, RN, MSN, [place] Diabetes Coordinator Inpatient Diabetes Program [phone] (Team Pager from 8am to [place])

## 2021-08-07 NOTE — Progress Notes (Signed)
OT Cancellation Note  Patient Details Name: TANAYSHA ALKINS MRN: [id number] DOB: 11/19/1965   Cancelled Treatment:    Reason Eval/Treat Not Completed: Patient declined, no reason specified. Pt reports she is waiting dialysis treatment and transfer to [place]. She declined OT intervention this session. OT to re-attempt at next available time.   Darleen Crocker, MS, OTR/L , CBIS ascom [phone]  08/07/21, 2:12 PM

## 2021-08-07 NOTE — Progress Notes (Signed)
[facility]  ROUNDING NOTE   Subjective:   Patient seen resting in bed, alert and oriented Tolerating meals, denies shortness of breath Pain well managed at this time Awaiting transfer to [facility]   Objective:  Vital signs in last 24 hours:  Temp:  [97.9 F (36.6 C)-98.3 F (36.8 C)] 98.1 F (36.7 C) (06/07 0744) Pulse Rate:  [58-62] 62 (06/07 0744) Resp:  [16-20] 16 (06/07 0744) BP: (121-130)/(51-56) 130/56 (06/07 0744) SpO2:  [96 %-100 %] 100 % (06/07 0744)  Weight change:  Filed Weights   08/02/21 1244 08/05/21 0945 08/05/21 1315  Weight: 90.2 kg 94.9 kg 95.8 kg    Intake/Output: I/O last 3 completed shifts: In: 203.8 [I.V.:3.8; IV Piggyback:200] Out: 200 [Urine:200]   Intake/Output this shift:  No intake/output data recorded.  Physical Exam: General: NAD  Head: Normocephalic, atraumatic. Moist oral mucosal membranes  Eyes: Anicteric  Lungs:  Clear to auscultation,normal effort  Heart: Regular rate and rhythm  Abdomen:  Soft, nontender  Extremities: No peripheral edema. Lt BKA  Neurologic: Alert, able to answer all questions  Skin: No lesions  Access: Lt AVF    Basic Metabolic Panel: Recent Labs  Lab 08/01/21 0333 08/02/21 1543 08/05/21 0922  NA 132*  --  127*  K 3.6 3.1* 4.6  CL 97*  --  88*  CO2 28  --  26  GLUCOSE 381*  --  101*  BUN 33*  --  72*  CREATININE 4.64*  --  7.09*  CALCIUM 8.5*  --  8.7*  PHOS  --   --  4.9*     Liver Function Tests: Recent Labs  Lab 08/05/21 0922  ALBUMIN 2.5*    No results for input(s): LIPASE, AMYLASE in the last 168 hours. No results for input(s): AMMONIA in the last 168 hours.  CBC: Recent Labs  Lab 08/01/21 0333 08/05/21 0922  WBC 14.1* 14.6*  HGB 7.7* 8.7*  HCT 25.4* 28.3*  MCV 96.2 94.0  PLT 277 291     Cardiac Enzymes: No results for input(s): CKTOTAL, CKMB, CKMBINDEX, TROPONINI in the last 168 hours.  BNP: Invalid input(s): POCBNP  CBG: Recent Labs  Lab  08/06/21 1130 08/06/21 1613 08/06/21 2128 08/07/21 0750 08/07/21 1125  GLUCAP 143* 106* 127* 73 99     Microbiology: Results for orders placed or performed during the hospital encounter of 07/21/21  Culture, blood (Routine X 2) w Reflex to ID Panel     Status: None   Collection Time: 07/22/21  2:44 PM   Specimen: Right Antecubital; Blood  Result Value Ref Range Status   Specimen Description RIGHT ANTECUBITAL  Final   Special Requests   Final    BOTTLES DRAWN AEROBIC AND ANAEROBIC Blood Culture adequate volume   Culture   Final    NO GROWTH 5 DAYS Performed at [facility] [address] [place], [place] [address]    Report Status 07/27/2021 FINAL  Final  Culture, blood (Routine X 2) w Reflex to ID Panel     Status: None   Collection Time: 07/22/21  2:46 PM   Specimen: BLOOD RIGHT HAND  Result Value Ref Range Status   Specimen Description BLOOD RIGHT HAND  Final   Special Requests   Final    BOTTLES DRAWN AEROBIC AND ANAEROBIC Blood Culture adequate volume   Culture   Final    NO GROWTH 5 DAYS Performed at [facility], [address], [place], [place] [address]  Report Status 07/27/2021 FINAL  Final  MRSA Next Gen by PCR, Nasal     Status: None   Collection Time: 07/27/21 12:18 PM   Specimen: Nasal Mucosa; Nasal Swab  Result Value Ref Range Status   MRSA by PCR Next Gen NOT DETECTED NOT DETECTED Final    Comment: (NOTE) The GeneXpert MRSA Assay (FDA approved for NASAL specimens only), is one component of a comprehensive MRSA colonization surveillance program. It is not intended to diagnose MRSA infection nor to guide or monitor treatment for MRSA infections. Test performance is not FDA approved in patients less than 57 years old. Performed at [facility], [address] [place], [place] [address]   Culture, blood (Routine X 2) w Reflex to ID Panel     Status: None   Collection Time: 07/27/21 12:31 PM   Specimen: BLOOD  Result  Value Ref Range Status   Specimen Description BLOOD RIGHT ANTECUBITAL  Final   Special Requests   Final    BOTTLES DRAWN AEROBIC AND ANAEROBIC Blood Culture adequate volume   Culture   Final    NO GROWTH 5 DAYS Performed at [facility], [address], [place], [place] [address]    Report Status 08/01/2021 FINAL  Final  Culture, blood (Routine X 2) w Reflex to ID Panel     Status: None   Collection Time: 07/27/21 12:33 PM   Specimen: BLOOD  Result Value Ref Range Status   Specimen Description BLOOD BLOOD RIGHT HAND  Final   Special Requests   Final    BOTTLES DRAWN AEROBIC AND ANAEROBIC Blood Culture adequate volume   Culture   Final    NO GROWTH 5 DAYS Performed at [facility], [address] [place], [place] [address]    Report Status 08/01/2021 FINAL  Final  Urine Culture     Status: None   Collection Time: 07/28/21  5:46 PM   Specimen: Urine, Clean Catch  Result Value Ref Range Status   Specimen Description   Final    URINE, CLEAN CATCH Performed at [facility], [address] [place], [place] [address]    Special Requests   Final    NONE Performed at [facility], [address], [place], [place] [address]    Culture   Final    NO GROWTH Performed at [facility], [place] [address] [place], [place] [address]    Report Status 07/30/2021 FINAL  Final  Aerobic Culture w Gram Stain (superficial specimen)     Status: None   Collection Time: 07/30/21  6:26 PM   Specimen: Wound  Result Value Ref Range Status   Specimen Description   Final    WOUND Performed at [facility], [address], [place], [place] [address]    Special Requests   Final    THIGH Performed at [facility], [address] [place], [place] [address]    Gram Stain   Final    FEW WBC PRESENT, PREDOMINANTLY PMN FEW SQUAMOUS EPITHELIAL CELLS PRESENT NO ORGANISMS SEEN    Culture   Final    NO GROWTH 3 DAYS Performed at [facility], [place] [address] [place],  [address]    Report Status 08/02/2021 FINAL  Final  Expectorated Sputum Assessment w Gram Stain, Rflx to Resp Cult     Status: None   Collection Time: 07/31/21  4:18 PM   Specimen: Sputum  Result Value Ref Range Status  Specimen Description SPUTUM  Final   Special Requests NONE  Final   Sputum evaluation   Final    THIS SPECIMEN IS ACCEPTABLE FOR SPUTUM CULTURE Performed at [facility], [address] [place], [place] [address]    Report Status 07/31/2021 FINAL  Final  Culture, Respiratory w Gram Stain     Status: None   Collection Time: 07/31/21  4:18 PM   Specimen: SPU  Result Value Ref Range Status   Specimen Description SPUTUM  Final   Special Requests NONE Reflexed from [id number]  Final   Gram Stain   Final    NO SQUAMOUS EPITHELIAL CELLS SEEN RARE WBC SEEN RARE GRAM POSITIVE RODS    Culture RARE CANDIDA ALBICANS  Final   Report Status 08/03/2021 FINAL  Final    Coagulation Studies: No results for input(s): LABPROT, INR in the last 72 hours.  Urinalysis: No results for input(s): COLORURINE, LABSPEC, PHURINE, GLUCOSEU, HGBUR, BILIRUBINUR, KETONESUR, PROTEINUR, UROBILINOGEN, NITRITE, LEUKOCYTESUR in the last 72 hours.  Invalid input(s): APPERANCEUR     Imaging: CT HIP LEFT WO CONTRAST  Result Date: 08/06/2021 CLINICAL DATA:  Persistent left hip pain. Evaluate healing of left hip fracture. EXAM: CT OF THE LEFT HIP WITHOUT CONTRAST TECHNIQUE: Multidetector CT imaging of the left hip was performed according to the standard protocol. Multiplanar CT image reconstructions were also generated. RADIATION DOSE REDUCTION: This exam was performed according to the departmental dose-optimization program which includes automated exposure control, adjustment of the mA and/or kV according to patient size and/or use of iterative reconstruction technique. COMPARISON:  Radiographs 07/29/2021 and MRI left hip 07/29/2021 FINDINGS: As  demonstrated on the recent MRI examination there are ununited left hip fractures with extensive lucency around the hip hardware and extensive abnormal fluid collections around the hip consistent with septic arthritis and osteomyelitis. Focal posterior fluid collection measures 6 cm and there appears to be a connecting sinus tract to the skin. Associated myofasciitis and cellulitis. The acetabulum appears intact. The left hemipelvic bony structures are also intact. No significant intrapelvic abnormalities are identified. Borderline pelvic and enlarged inguinal lymph nodes again demonstrated. IMPRESSION: 1. Ununited left hip fractures with extensive lucency around the hip hardware consistent with extensive osteomyelitis. 2. 6 cm posterior fluid collection/abscess appears to be a connecting with a sinus tract to the skin. 3. Probable septic arthritis, myofasciitis and cellulitis. 4. Associated myofasciitis and cellulitis. Electronically Signed   By: Marijo Sanes M.D.   On: 08/06/2021 08:58     Medications:    sodium chloride Stopped (08/06/21 2259)   meropenem (MERREM) IV Stopped (08/06/21 2237)    amitriptyline  25 mg Oral QHS   amLODipine  5 mg Oral Daily   atorvastatin  40 mg Oral q1800   carvedilol  6.25 mg Oral BID   Chlorhexidine Gluconate Cloth  6 each Topical Q0600   citalopram  20 mg Oral Daily   DULoxetine  60 mg Oral Daily   epoetin (EPOGEN/PROCRIT) injection  10,000 Units Intravenous Q M,W,F-HD   insulin aspart  0-5 Units Subcutaneous QHS   insulin aspart  0-9 Units Subcutaneous TID WC   insulin aspart  3 Units Subcutaneous TID WC   insulin glargine-yfgn  10 Units Subcutaneous QHS   pantoprazole  40 mg Oral Daily   sevelamer carbonate  1,600 mg Oral TID WC   sodium chloride, cyclobenzaprine, HYDROmorphone (DILAUDID) injection, nicotine, ondansetron **OR** ondansetron (ZOFRAN) IV, oxyCODONE-acetaminophen, polyethylene glycol  Assessment/ Plan:  Ms. NEENAH CANTER is a 56  y.o.   female with past medical history including hyperlipidemia, hypertension, left BKA, diabetes, chronic pain syndrome, and end-stage renal disease on hemodialysis, who was admitted to [facility] on 07/21/2021 for Acidosis [E87.20] Hyperkalemia [E87.5] Elevated BUN [R79.9] ESRD (end stage renal disease) on dialysis ([place]) [N18.6, Z99.2] Femur fracture, left (HCC) [S72.92XA] Closed fracture of left femur, unspecified fracture morphology, unspecified portion of femur, initial encounter ([place]) [S72.92XA] Closed fracture of left distal femur (HCC) [S72.402A]  [facility]/MWF/left aVF/83 kg  End-stage renal disease on dialysis.  Will maintain outpatient schedule if possible.    -Scheduled to receive dialysis later today, UF goal 0.5 to 1 L as tolerated.  Next treatment scheduled for Friday.  -Patient currently awaiting transfer to [facility] for orthopedic evaluation.  Due to time limitations, renal navigator released outpatient clinic chair.  Once patient ready for discharge from [facility], they will arrange if needed.  2. Anemia of chronic kidney disease Normocytic Lab Results  Component Value Date   HGB 8.7 (L) 08/05/2021   Will transition to Mircera as outpatient.  We will continue with EPO IV during dialysis treatments inpatient.  3. Secondary Hyperparathyroidism:  Lab Results  Component Value Date   PTH 44 01/11/2020   PTH Comment 01/11/2020   CALCIUM 8.7 (L) 08/05/2021   CAION 1.10 (L) 04/07/2017   PHOS 4.9 (H) 08/05/2021   Phosphorus remain within acceptable range.  Continue Renvela with meals.   4.  Hypertension with chronic kidney disease.  Continue amlodipine and carvedilol.  Blood pressure stable for this patient  5. Diabetes mellitus type II with chronic kidney disease: insulin dependent. Home regimen includes to send NovoLog 70/30. Most recent hemoglobin A1c is 7.1%  on 07/22/21.          -Glucose stable, SSI managed by primary team.  6.  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 -Evaluated by orthopedics, too complex for surgery. -Currently on meropenem for healthcare associated pneumonia -Chronic antibiotic suppression recommended by orthopedics.  -Will receive second opinion from [facility] orthopedist.  Awaiting transfer   LOS: [place] 6/7/202312:13 PM

## 2021-08-07 NOTE — H&P (Signed)
History and Physical    [name] [name] DOB: August 22, 1965 DOA: 08/07/2021  PCP: Julian Hy, PA-C  Patient coming from:  [facility]  Chief Complaint: Left hip infection  HPI: [name] is a 56 y.o. female with medical history significant of hypertension, hyperlipidemia, ESRD on HD MWF, history of left BKA in 2021, depression/anxiety, stroke, tobacco use, insulin-dependent diabetes mellitus, insomnia, chronic pain syndrome admitted to [facility] for distal left femur fracture secondary to a mechanical fall, deemed no surgery by orthopedics.  Also started on dialysis as she missed it for a whole week.  WBC count trending up and work-up suggestive of multifocal pneumonia, started on antibiotics on 5/27 and finished course on 6/7.  Urine culture, sputum culture, blood culture all negative.  Underwent drainage of superficial abscess of left hip on 5/28, culture negative.  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  Ortho initially recommended I&D and hardware removal and placement of antibiotic beads but patient declined surgery.  Orthopedics later felt that patient was very high risk for any surgical intervention with concern for nonhealing wound and worsening of her underlying comorbidities.  ID had recommended I&D but Ortho felt that the infection would recur soon and she will need a very extensive debridement.  IR was also consulted but patient declined CT-guided drainage procedure and requested surgical opinion from her primary orthopedist Dr. Lyla Glassing who accepted the patient for transfer to [facility] under hospitalist service, orthopedics will consult.  Patient was initially treated with cefepime but later switched to meropenem due to prior history of resistant Enterobacter and Bacteroides.    Labs done 6/5 showing WBC 14.6, hemoglobin 8.7 (baseline 9-10 range), platelet count 291k.  Sodium 127,  potassium 4.6, chloride 88, bicarb 26, BUN 72, creatinine 7.0, glucose 101.  Repeat CT of hip done 6/6: "IMPRESSION: 1. Ununited left hip fractures with extensive lucency around the hip hardware consistent with extensive osteomyelitis. 2. 6 cm posterior fluid collection/abscess appears to be a connecting with a sinus tract to the skin. 3. Probable septic arthritis, myofasciitis and cellulitis. 4. Associated myofasciitis and cellulitis."  Hospital course complicated by hypoglycemia and dose of Semglee decreased to 15 units nightly and mealtime coverage decreased to 3 units.  Sliding scale insulin changed from moderate to sensitive.  Patient reports pain in her left leg which is controlled with pain medications.  States she is here to get Dr. Sid Falcon opinion regarding her left hip infection as he previously operated on this hip.  No other complaints.  Denies fevers, chills, cough, shortness of breath, nausea, vomiting, abdominal pain, diarrhea, or constipation.  Review of Systems:  Review of Systems  All other systems reviewed and are negative.  Past Medical History:  Diagnosis Date   Anxiety 2002   Chest tightness    Depression 2001   Diabetes type 1, uncontrolled    at age 37   Diabetic neuropathy ([place])    Essential hypertension 2015   GERD (gastroesophageal reflux disease)    about age of 68   Nausea and vomiting in adult    Stroke [facility])    Urinary frequency    Yeast vaginitis     Past Surgical History:  Procedure Laterality Date   ACHILLES TENDON SURGERY Left 03/10/2013   Procedure: LEFT CHOPART AMPUTATION/ LEFT TENDON ACHILLES [place];  Surgeon: Wylene Simmer, MD;  Location: [place];  Service: Orthopedics;  Laterality: Left;   AMPUTATION Left 06/15/2012  Procedure: AMPUTATION DIGIT;  Surgeon: Meredith Pel, MD;  Location: [place];  Service: Orthopedics;  Laterality: Left;  Left great toe revision amputation   AMPUTATION Left 01/12/2020   Procedure: AMPUTATION BELOW  KNEE;  Surgeon: Wylene Simmer, MD;  Location: [place];  Service: Orthopedics;  Laterality: Left;   AV FISTULA PLACEMENT Left 01/09/2020   Procedure: LEFT UPPER ARM ARTERIOVENOUS (AV) FISTULA CREATION;  Surgeon: Cherre Robins, MD;  Location: [place];  Service: Vascular;  Laterality: Left;   ENDOMETRIAL ABLATION     I & D EXTREMITY Left 01/19/2020   Procedure: IRRIGATION AND DEBRIDEMENT Left Hip;  Surgeon: Rod Can, MD;  Location: [place];  Service: Orthopedics;  Laterality: Left;   INSERTION OF DIALYSIS CATHETER Right 01/09/2020   Procedure: INSERTION OF DIALYSIS CATHETER;  Surgeon: Cherre Robins, MD;  Location: [place];  Service: Vascular;  Laterality: Right;   INTRAMEDULLARY (IM) NAIL INTERTROCHANTERIC Left 12/29/2019   Procedure: INTRAMEDULLARY (IM) NAIL INTERTROCHANTRIC;  Surgeon: Rod Can, MD;  Location: [facility];  Service: Orthopedics;  Laterality: Left;   IR ANGIOGRAM EXTREMITY LEFT  12/17/2018   IR FEM POP ART ATHERECT INC PTA MOD SED  12/17/2018   IR RADIOLOGIST EVAL & MGMT  11/30/2018   IR RADIOLOGIST EVAL & MGMT  12/09/2018   IR RADIOLOGIST EVAL & MGMT  02/08/2019   IR US GUIDE VASC ACCESS RIGHT  12/17/2018   LAPAROSCOPIC CHOLECYSTECTOMY  01/30/2015   [facility]      reports that she has been smoking cigarettes. She has a 5.50 pack-year smoking history. She has never used smokeless tobacco. She reports current alcohol use. She reports that she does not use drugs.  Allergies  Allergen Reactions   Trazodone Swelling   Latex Rash   Lidocaine Itching    Family History  Problem Relation Age of Onset   Diabetes Mother    Heart attack Mother    Heart disease Mother        before age 63   Hypertension Mother    Hyperlipidemia Mother    Diabetes Father    Heart attack Father    Heart disease Father        before age 40   Diabetes Sister    Hypertension Sister     Prior to Admission medications   Medication Sig Start Date End Date Taking? Authorizing Provider   alosetron (LOTRONEX) 1 MG tablet Take 1 mg by mouth 2 (two) times daily. 12/19/19   [provider]  amitriptyline (ELAVIL) 25 MG tablet Take 25 mg by mouth at bedtime. 07/02/21   [provider]  amLODipine (NORVASC) 5 MG tablet Take 1 tablet (5 mg total) by mouth daily. 02/21/20   Love, Ivan Anchors, PA-C  atorvastatin (LIPITOR) 40 MG tablet TAKE 1 TABLET (40 MG TOTAL) BY MOUTH DAILY AT 6 PM. 02/22/19   Fulp, Cammie, MD  Biotin 10 MG CAPS Take 1 capsule by mouth daily. 06/05/21   [provider]  carvedilol (COREG) 6.25 MG tablet Take 1 tablet (6.25 mg total) by mouth 2 (two) times daily. 02/21/20   Love, Ivan Anchors, PA-C  Cinnamon 500 MG capsule Take 500 mg by mouth daily.    [provider]  citalopram (CELEXA) 20 MG tablet Take 1 tablet (20 mg total) by mouth daily. 02/22/20   Love, Ivan Anchors, PA-C  cyclobenzaprine (FLEXERIL) 10 MG tablet Take 1 tablet by mouth 3 (three) times daily as needed. 07/28/20   [provider]  D3-50 1.25 MG (50000 UT) capsule Take 50,000 Units by mouth as directed. Monday, wed, Friday 05/31/20   [provider]  DULoxetine (CYMBALTA) 60 MG capsule Take 60 mg by mouth daily. 07/17/20   [provider]  hydrALAZINE (APRESOLINE) 100 MG tablet Take 1 tablet (100 mg total) by mouth 3 (three) times daily. 02/21/20   Love, Ivan Anchors, PA-C  insulin aspart (NOVOLOG) 100 UNIT/ML injection Inject 6 Units into the skin 3 (three) times daily with meals. 08/05/21   Lorella Nimrod, MD  insulin aspart (NOVOLOG) 100 UNIT/ML injection Inject 0-15 Units into the skin 3 (three) times daily with meals. 08/05/21   Lorella Nimrod, MD  insulin aspart (NOVOLOG) 100 UNIT/ML injection Inject 0-5 Units into the skin at bedtime. 08/05/21   Lorella Nimrod, MD  insulin glargine (LANTUS) 100 UNIT/ML injection Inject 0.12 mLs (12 Units total) into the skin at bedtime. 02/21/20   Love, Ivan Anchors, PA-C  insulin glargine-yfgn (SEMGLEE) 100 UNIT/ML injection Inject  0.15 mLs (15 Units total) into the skin 2 (two) times daily. 08/05/21   Lorella Nimrod, MD  insulin glargine-yfgn (SEMGLEE) 100 UNIT/ML injection Inject 0.1 mLs (10 Units total) into the skin at bedtime. 08/07/21   Nolberto Hanlon, MD  iron sucrose in sodium chloride 0.9 % 100 mL Iron Sucrose (Venofer) 05/21/20   [provider]  Menthol-Methyl Salicylate (MUSCLE RUB) 10-15 % CREA Apply 1 application topically as needed for muscle pain. 02/02/20   Love, Ivan Anchors, PA-C  meropenem 500 mg in sodium chloride 0.9 % 100 mL Inject 500 mg into the vein daily. 08/05/21   Lorella Nimrod, MD  naloxone [facility]) nasal spray 4 mg/0.1 mL 1 spray as directed. Patient not taking: Reported on 07/21/2021 04/27/20   [provider]  nicotine (NICODERM CQ - DOSED IN MG/24 HOURS) 21 mg/24hr patch Place 1 patch (21 mg total) onto the skin daily as needed (nicotine craving). 07/25/21   Emeterio Reeve, DO  oxyCODONE-acetaminophen (PERCOCET) 7.5-325 MG tablet Take 1 tablet by mouth every 6 (six) hours as needed for moderate pain or severe pain. 08/05/21   Lorella Nimrod, MD  pantoprazole (PROTONIX) 40 MG tablet Take 1 tablet (40 mg total) by mouth daily. 07/26/21   Emeterio Reeve, DO  polyethylene glycol (MIRALAX / GLYCOLAX) 17 g packet Take 17 g by mouth daily as needed for mild constipation. 07/25/21   Emeterio Reeve, DO  sevelamer carbonate (RENVELA) 800 MG tablet Take 2 tablets (1,600 mg total) by mouth 3 (three) times daily with meals. 02/21/20   Love, Ivan Anchors, PA-C  sodium chloride (OCEAN) 0.65 % SOLN nasal spray Place 1 spray into both nostrils as needed for congestion (nose irritation). 02/02/20   Bary Leriche, PA-C    Physical Exam: Vitals:   08/07/21 2020  BP: (!) 131/54  Pulse: 60  Resp: 18  Temp: 98 F (36.7 C)  TempSrc: Oral  SpO2: 98%  Weight: 103 kg  Height: 5\' 10"  (1.778 m)    Physical Exam Vitals reviewed.  Constitutional:      General: She is not in acute distress. HENT:      Head: Normocephalic and atraumatic.  Eyes:     Extraocular Movements: Extraocular movements intact.     Conjunctiva/sclera: Conjunctivae normal.  Cardiovascular:     Rate and Rhythm: Normal rate and regular rhythm.     Pulses: Normal pulses.  Pulmonary:     Effort: Pulmonary effort is normal. No respiratory distress.     Breath sounds: Normal  breath sounds. No wheezing or rales.  Abdominal:     General: Bowel sounds are normal. There is no distension.     Palpations: Abdomen is soft.     Tenderness: There is no abdominal tenderness.  Musculoskeletal:        General: No tenderness.     Cervical back: Normal range of motion.  Skin:    General: Skin is warm and dry.  Neurological:     General: No focal deficit present.     Mental Status: She is alert and oriented to person, place, and time.     Labs on Admission: I have personally reviewed following labs and imaging studies  CBC: Recent Labs  Lab 08/01/21 0333 08/05/21 0922  WBC 14.1* 14.6*  HGB 7.7* 8.7*  HCT 25.4* 28.3*  MCV 96.2 94.0  PLT 277 371   Basic Metabolic Panel: Recent Labs  Lab 08/01/21 0333 08/02/21 1543 08/05/21 0922  NA 132*  --  127*  K 3.6 3.1* 4.6  CL 97*  --  88*  CO2 28  --  26  GLUCOSE 381*  --  101*  BUN 33*  --  72*  CREATININE 4.64*  --  7.09*  CALCIUM 8.5*  --  8.7*  PHOS  --   --  4.9*   GFR: Estimated Creatinine Clearance: 11.8 mL/min (A) (by C-G formula based on SCr of 7.09 mg/dL (H)). Liver Function Tests: Recent Labs  Lab 08/05/21 0922  ALBUMIN 2.5*   No results for input(s): LIPASE, AMYLASE in the last 168 hours. No results for input(s): AMMONIA in the last 168 hours. Coagulation Profile: No results for input(s): INR, PROTIME in the last 168 hours. Cardiac Enzymes: No results for input(s): CKTOTAL, CKMB, CKMBINDEX, TROPONINI in the last 168 hours. BNP (last 3 results) No results for input(s): PROBNP in the last 8760 hours. HbA1C: No results for input(s): HGBA1C in the  last 72 hours. CBG: Recent Labs  Lab 08/06/21 1613 08/06/21 2128 08/07/21 0750 08/07/21 1125 08/07/21 1559  GLUCAP 106* 127* 73 99 83   Lipid Profile: No results for input(s): CHOL, HDL, LDLCALC, TRIG, CHOLHDL, LDLDIRECT in the last 72 hours. Thyroid Function Tests: No results for input(s): TSH, T4TOTAL, FREET4, T3FREE, THYROIDAB in the last 72 hours. Anemia Panel: No results for input(s): VITAMINB12, FOLATE, FERRITIN, TIBC, IRON, RETICCTPCT in the last 72 hours. Urine analysis:    Component Value Date/Time   COLORURINE YELLOW (A) 08/02/2021 1750   APPEARANCEUR HAZY (A) 08/02/2021 1750   LABSPEC 1.014 08/02/2021 1750   PHURINE 5.0 08/02/2021 1750   GLUCOSEU 50 (A) 08/02/2021 1750   GLUCOSEU NEGATIVE 03/29/2014 1259   HGBUR NEGATIVE 08/02/2021 1750   BILIRUBINUR NEGATIVE 08/02/2021 1750   BILIRUBINUR neg 02/16/2017 1501   KETONESUR NEGATIVE 08/02/2021 1750   PROTEINUR 100 (A) 08/02/2021 1750   UROBILINOGEN 0.2 02/16/2017 1501   UROBILINOGEN 0.2 03/29/2014 1259   NITRITE NEGATIVE 08/02/2021 1750   LEUKOCYTESUR TRACE (A) 08/02/2021 1750    Radiological Exams on Admission: I have personally reviewed images CT HIP LEFT WO CONTRAST  Result Date: 08/06/2021 CLINICAL DATA:  Persistent left hip pain. Evaluate healing of left hip fracture. EXAM: CT OF THE LEFT HIP WITHOUT CONTRAST TECHNIQUE: Multidetector CT imaging of the left hip was performed according to the standard protocol. Multiplanar CT image reconstructions were also generated. RADIATION DOSE REDUCTION: This exam was performed according to the departmental dose-optimization program which includes automated exposure control, adjustment of the mA and/or kV according to patient  size and/or use of iterative reconstruction technique. COMPARISON:  Radiographs 07/29/2021 and MRI left hip 07/29/2021 FINDINGS: As demonstrated on the recent MRI examination there are ununited left hip fractures with extensive lucency around the hip  hardware and extensive abnormal fluid collections around the hip consistent with septic arthritis and osteomyelitis. Focal posterior fluid collection measures 6 cm and there appears to be a connecting sinus tract to the skin. Associated myofasciitis and cellulitis. The acetabulum appears intact. The left hemipelvic bony structures are also intact. No significant intrapelvic abnormalities are identified. Borderline pelvic and enlarged inguinal lymph nodes again demonstrated. IMPRESSION: 1. Ununited left hip fractures with extensive lucency around the hip hardware consistent with extensive osteomyelitis. 2. 6 cm posterior fluid collection/abscess appears to be a connecting with a sinus tract to the skin. 3. Probable septic arthritis, myofasciitis and cellulitis. 4. Associated myofasciitis and cellulitis. Electronically Signed   By: Marijo Sanes M.D.   On: 08/06/2021 08:58    Assessment and Plan  Left hip septic arthritis Underwent drainage of superficial abscess of left hip on 5/28, culture negative.  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  Ortho at [facility] initially recommended I&D and hardware removal and placement of antibiotic beads but patient declined surgery.  Orthopedics later felt that patient was very high risk for any surgical intervention with concern for nonhealing wound and worsening of her underlying comorbidities.  ID had recommended I&D but Ortho felt that the infection would recur soon and she will need a very extensive debridement.  IR was also consulted but patient declined CT-guided drainage procedure and requested surgical opinion from her primary orthopedist Dr. Lyla Glassing. Patient was initially treated with cefepime but later switched to meropenem due to prior history of resistant Enterobacter and Bacteroides.  WBC trended down on labs done at [facility].  Blood cultures showing no growth.  No fever or signs of  sepsis at present.  Repeat CT of hip done 6/6: "IMPRESSION: 1. Ununited left hip fractures with extensive lucency around the hip hardware consistent with extensive osteomyelitis. 2. 6 cm posterior fluid collection/abscess appears to be a connecting with a sinus tract to the skin. 3. Probable septic arthritis, myofasciitis and cellulitis. 4. Associated myofasciitis and cellulitis."  Plan: -Continue meropenem -Pain management -Repeat CBC -Consult Ortho and ID in the morning.  Left femur fracture -Orthopedics at [facility] recommended no surgery. Recommended brace and flexion of the left knee, nonweightbearing. -Continue pain management  Skin abscess of left hip Drained copious purulent fluid, wound culture negative. -Wound care   Insulin-dependent type 2 diabetes A1c 7.1 on 07/22/2021.  Due to hypoglycemia at [facility], dose of Semglee decreased to 15 units nightly and mealtime coverage decreased to 3 units.  -Continue long-acting as scheduled preprandial insulin after pharmacy med rec is done. -Very sensitive sliding scale insulin  ESRD on HD MWF No signs of volume overload at this time. -Renal function panel -Consult nephrology in the morning.   Depression/anxiety -Continue amitriptyline, Celexa, and duloxetine after pharmacy med rec is done.  Hypertension Stable. -Continue amlodipine and carvedilol after pharmacy med rec is done.   Hyponatremia Labs done 6/5 showing sodium 127. -Repeat labs to check sodium level  DVT prophylaxis: SCDs Code Status: Full Code Family Communication: No family available at this time. Level of care: Med-Surg Admission status: It is my clinical opinion that admission to INPATIENT is reasonable and necessary because of the expectation that this patient will require hospital  care that crosses at least 2 midnights to treat this condition based on the medical complexity of the problems presented.  Given the aforementioned information, the predictability  of an adverse outcome is felt to be significant.   Shela Leff MD [facility]  If 7PM-7AM, please contact night-coverage [email or web address]  08/07/2021, 9:12 PM

## 2021-08-07 NOTE — Progress Notes (Signed)
Report to [facility] carelink [phone]

## 2021-08-07 NOTE — Plan of Care (Signed)

## 2021-08-07 NOTE — Progress Notes (Signed)
PROGRESS NOTE    [name]  LOV:[id number] DOB: September 13, 1965 DOA: 07/21/2021 PCP: Donna Hy, PA-C    Brief Narrative:  [name] is a 56 year old female with history of hypertension, hyperlipidemia, end-stage renal disease on HD MWF, history of left BKA in 2021, depression, anxiety, insulin-dependent diabetes mellitus, insomnia, chronic pain syndrome, who presents emergency department for chief concerns of pain from falling during transfer from wheelchair to recliner about 1 week ago. She missed dialysis for whole week.  Upon arriving the hospital, she was found to have a distal left femur fracture, deemed no surgery by orthopedics.  But no weightbearing.  Patient is also started on dialysis.    Was waiting on patient to decide on SNF facility for rehab since she was initially advised inpatient rehab but later stated she did not think she would be able to participate in intensive therapy. Pending dialysis spot but SNF placement was confirmed. Discharge was not considered however as WBC had been trending up, workup showed multifocal pneumonia on CXR despite no significant symptoms, started abx 07/27/21, obtained UA for completeness (still makes some urine and this was obtained 05/28 and abnormal), Urine Cx, sputum Cx, blood Cx are all negative.  Also 05/28 drained superficial abscess L hip and sent for culture which was also negative.  d/w ortho 05/29 and obtaining XR and MRI to eval for L hip septic arthritis. 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 05/30 Ortho recommended I&D and hardware removal and place abx beads but pt does not want to proceed w/ surgery despite extensive discussion risk/benefit. ID consulted.    5/31: Had a long discussion with Ortho.  Patient will be very high risk for any surgical intervention with concern of nonhealing wound and worsening of her underlying comorbidities.   Most likely will have infection recurrent.  They also does not want any incision and drainage as requested by ID to clean the pockets stating that this infection will soon recur as she will need a very extensive debridement. Patient initially received cefepime which was later switched with meropenem based on her prior history of resistant Enterobacter and bacteroids. This is a very unfortunate situation, no definitive solution at this time.   6/1: Patient continued to experience significant pain in left leg.  She was to see her on orthopedic surgeon regarding her new fracture and continuation of infection.   6/2: Patient will be going for percutaneous abscess drainage with IR today. Nursing concern of dark-colored urine-UA ordered to rule out hematuria.  Most likely concentrated urine as patient is ESRD. CBG remained elevated-increase the dose of seemegle and switch her to moderate SSI Patient now had outpatient chair for dialysis, will be discharged once cleared from infectious disease.   6/3: declined CT guided Drainage procedure. Still considering surgical opinion from primary orthopedist.    6/5: Episode of becoming hypoglycemic, CBG at 65 requiring intervention.  Decrease Semglee to 15 twice daily. Dr. Lyla Glassing accepted her at [facility].  Patient only want her on orthopedic surgeon, Dr. Lyla Glassing to make decisions regarding her infected hardware and a new fracture. Patient will be transferred to [facility] for further management. She will be going under hospitalist services, please consult Dr. Lyla Glassing or his PA once patient is there.   6/6: Patient still waiting for transfer.  CBG at 91 in the morning.  Patient received only 1 dose of Semglee last night.  No additional insulin was  given.  CBG now remained within goal and becoming hypoglycemic at times.  Decrease Semglee to nightly, decrease mealtime coverage to 3 units and make sliding scale sensitive. Repeat CT hip was done by Dr. Lyla Glassing  and it shows 1. Ununited left hip fractures with extensive lucency around the hip hardware consistent with extensive osteomyelitis. 2. 6 cm posterior fluid collection/abscess appears to be a connecting with a sinus tract to the skin. 3. Probable septic arthritis, myofasciitis and cellulitis. 4. Associated myofasciitis and cellulitis.       6/7 awaiting bed at [facility].     Consultants:  Nephrology, iD  Procedures: HD  Antimicrobials:  meropenem   Subjective: No complaints this am. No sob, cp  Objective: Vitals:   08/07/21 0444 08/07/21 0744 08/07/21 1232 08/07/21 1550  BP: (!) 125/53 (!) 130/56 (!) 131/58 (!) 150/64  Pulse: (!) 58 62 (!) 56 61  Resp: 18 16 16 16   Temp: 98 F (36.7 C) 98.1 F (36.7 C) 97.8 F (36.6 C) 98 F (36.7 C)  TempSrc:    Oral  SpO2: 98% 100% 100% 100%  Weight:      Height:        Intake/Output Summary (Last 24 hours) at 08/07/2021 1557 Last data filed at 08/07/2021 1504 Gross per 24 hour  Intake 203.8 ml  Output 200 ml  Net 3.8 ml   Filed Weights   08/02/21 1244 08/05/21 0945 08/05/21 1315  Weight: 90.2 kg 94.9 kg 95.8 kg    Examination: Calm, NAD Cta no w/r Reg s1/s2 no gallop Soft benign +bs No edema Aaoxox3  Mood and affect appropriate in current setting     Data Reviewed: I have personally reviewed following labs and imaging studies  CBC: Recent Labs  Lab 08/01/21 0333 08/05/21 0922  WBC 14.1* 14.6*  HGB 7.7* 8.7*  HCT 25.4* 28.3*  MCV 96.2 94.0  PLT 277 301   Basic Metabolic Panel: Recent Labs  Lab 08/01/21 0333 08/02/21 1543 08/05/21 0922  NA 132*  --  127*  K 3.6 3.1* 4.6  CL 97*  --  88*  CO2 28  --  26  GLUCOSE 381*  --  101*  BUN 33*  --  72*  CREATININE 4.64*  --  7.09*  CALCIUM 8.5*  --  8.7*  PHOS  --   --  4.9*   GFR: Estimated Creatinine Clearance: 11.4 mL/min (A) (by C-G formula based on SCr of 7.09 mg/dL (H)). Liver Function Tests: Recent Labs  Lab 08/05/21 0922  ALBUMIN 2.5*   No  results for input(s): LIPASE, AMYLASE in the last 168 hours. No results for input(s): AMMONIA in the last 168 hours. Coagulation Profile: No results for input(s): INR, PROTIME in the last 168 hours. Cardiac Enzymes: No results for input(s): CKTOTAL, CKMB, CKMBINDEX, TROPONINI in the last 168 hours. BNP (last 3 results) No results for input(s): PROBNP in the last 8760 hours. HbA1C: No results for input(s): HGBA1C in the last 72 hours. CBG: Recent Labs  Lab 08/06/21 1130 08/06/21 1613 08/06/21 2128 08/07/21 0750 08/07/21 1125  GLUCAP 143* 106* 127* 73 99   Lipid Profile: No results for input(s): CHOL, HDL, LDLCALC, TRIG, CHOLHDL, LDLDIRECT in the last 72 hours. Thyroid Function Tests: No results for input(s): TSH, T4TOTAL, FREET4, T3FREE, THYROIDAB in the last 72 hours. Anemia Panel: No results for input(s): VITAMINB12, FOLATE, FERRITIN, TIBC, IRON, RETICCTPCT in the last 72 hours. Sepsis Labs: No results for input(s): PROCALCITON, LATICACIDVEN in the last 168 hours.  Recent Results (from the past 240 hour(s))  Urine Culture     Status: None   Collection Time: 07/28/21  5:46 PM   Specimen: Urine, Clean Catch  Result Value Ref Range Status   Specimen Description   Final    URINE, CLEAN CATCH Performed at [facility], [address] [place], [place] [address]    Special Requests   Final    NONE Performed at [facility], [address], [place], [place] [address]    Culture   Final    NO GROWTH Performed at [facility], [place] [address] [place], [place] [address]    Report Status 07/30/2021 FINAL  Final  Aerobic Culture w Gram Stain (superficial specimen)     Status: None   Collection Time: 07/30/21  6:26 PM   Specimen: Wound  Result Value Ref Range Status   Specimen Description   Final    WOUND Performed at [facility], [address] [place], [place] [address]    Special Requests   Final    THIGH Performed at [facility], [address] [place], [place] [address]    Gram Stain   Final    FEW WBC PRESENT, PREDOMINANTLY PMN FEW SQUAMOUS EPITHELIAL CELLS PRESENT NO ORGANISMS SEEN    Culture   Final    NO GROWTH 3 DAYS Performed at [facility], [place] [address] [place], [place] [address]    Report Status 08/02/2021 FINAL  Final  Expectorated Sputum Assessment w Gram Stain, Rflx to Resp Cult     Status: None   Collection Time: 07/31/21  4:18 PM   Specimen: Sputum  Result Value Ref Range Status   Specimen Description SPUTUM  Final   Special Requests NONE  Final   Sputum evaluation   Final    THIS SPECIMEN IS ACCEPTABLE FOR SPUTUM CULTURE Performed at [facility], [address], [place],  [address]    Report Status 07/31/2021 FINAL  Final  Culture, Respiratory w Gram Stain     Status: None   Collection Time: 07/31/21  4:18 PM   Specimen: SPU  Result Value Ref Range Status   Specimen Description SPUTUM  Final   Special Requests NONE Reflexed from [id number]  Final   Gram Stain   Final    NO SQUAMOUS EPITHELIAL CELLS SEEN RARE WBC SEEN RARE GRAM POSITIVE RODS    Culture RARE CANDIDA ALBICANS  Final   Report Status 08/03/2021 FINAL  Final         Radiology Studies: CT HIP LEFT WO CONTRAST  Result Date: 08/06/2021 CLINICAL DATA:  Persistent left hip pain. Evaluate healing of left hip fracture. EXAM: CT OF THE LEFT HIP WITHOUT CONTRAST TECHNIQUE: Multidetector CT imaging of the left hip was performed according to the standard protocol. Multiplanar CT image reconstructions were also generated. RADIATION DOSE REDUCTION: This exam was performed according to the departmental dose-optimization program which includes automated exposure control, adjustment of the mA and/or kV according to patient size and/or use of iterative reconstruction technique. COMPARISON:  Radiographs 07/29/2021 and MRI left hip 07/29/2021 FINDINGS: As demonstrated on the recent MRI examination  there are ununited left hip fractures with extensive lucency around the hip hardware and extensive abnormal fluid collections around the hip consistent with septic arthritis and osteomyelitis. Focal posterior fluid collection measures 6 cm and there appears to be a connecting sinus tract to the skin. Associated myofasciitis and cellulitis. The acetabulum appears intact.  The left hemipelvic bony structures are also intact. No significant intrapelvic abnormalities are identified. Borderline pelvic and enlarged inguinal lymph nodes again demonstrated. IMPRESSION: 1. Ununited left hip fractures with extensive lucency around the hip hardware consistent with extensive osteomyelitis. 2. 6 cm posterior fluid collection/abscess appears to be a connecting with a sinus tract to the skin. 3. Probable septic arthritis, myofasciitis and cellulitis. 4. Associated myofasciitis and cellulitis. Electronically Signed   By: Marijo Sanes M.D.   On: 08/06/2021 08:58        Scheduled Meds:  amitriptyline  25 mg Oral QHS   amLODipine  5 mg Oral Daily   atorvastatin  40 mg Oral q1800   carvedilol  6.25 mg Oral BID   Chlorhexidine Gluconate Cloth  6 each Topical Q0600   citalopram  20 mg Oral Daily   DULoxetine  60 mg Oral Daily   epoetin (EPOGEN/PROCRIT) injection  10,000 Units Intravenous Q M,W,F-HD   insulin aspart  0-5 Units Subcutaneous QHS   insulin aspart  0-9 Units Subcutaneous TID WC   insulin aspart  3 Units Subcutaneous TID WC   insulin glargine-yfgn  10 Units Subcutaneous QHS   pantoprazole  40 mg Oral Daily   sevelamer carbonate  1,600 mg Oral TID WC   Continuous Infusions:  sodium chloride Stopped (08/06/21 2259)   meropenem (MERREM) IV Stopped (08/06/21 2237)    Assessment & Plan:   Principal Problem:   Femur fracture, left ([place]) Active Problems:   Septic arthritis of hip ([place])   Skin abscess L hip   HCAP (healthcare-associated pneumonia)   UTI (urinary tract infection)   Type 2 diabetes  mellitus with renal complication (HCC)   ESRD (end stage renal disease) on dialysis (HCC)   TOBACCO DEPENDENCE   Anxiety and depression   Essential hypertension   Hyponatremia   Insomnia   Obesity (BMI 30.0-34.9)   Leukocytosis   History of CVA (cerebrovascular accident) without residual deficits   Hyperkalemia   Closed fracture of left distal femur ([place])   Uncontrolled type 2 diabetes mellitus with hypoglycemia, with long-term current use of insulin (HCC)   Femur fracture, left [facility]) Orthopedic service recommends no surgery  Orthopedic service recommends brace in flexion of the left knee, nonweightbearing Patient to follow-up outpatient with orthopedic provider in 1 week after discharge 6/7 rehab/snf placement    Septic arthritis of hip ([place]) Superficial abscess described as recurrent since previous surgery, drained at bedside 05/28 and obtained XR and MRI - concerning for 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  05/30 Ortho recommended I&D and hardware removal and place abx beads but pt does not want to proceed w/ surgery despite extensive discussion risk/benefit.  ID consulted 05/30 Cultures from L hip abscess obtained 05/28 remain negative. ID switched antibiotics to meropenem based on prior culture results where she grew resistant Enterobacter and bacteroids. Another discussion with orthopedic and they are not going to perform any procedure as risk is very more than benefit.  Patient also does not want any more procedures. -Continue with meropenem, might need long-term suppressive antibiotics on discharge, appreciate ID help 6/7 patient is being transferred to [facility] so her on orthopedic surgeon can be involved in further management.  Bed pending     Skin abscess L hip Drained copious purulent fluid, sent for wound culture and it remains negative. Repeat CT hip which was ordered by her orthopedic surgeon  with abscess surrounding hardware and sinus  tract to skin 6/7 continue wound care   HCAP (healthcare-associated pneumonia) WBC trending up, w/u reveals pneumonia on CXR despite no significant symptoms though pt does have smoker's cough, no SOB. Poorly controlled diabetes and pt's lack of effort w/ mobilization/inceptive spirometry likely contributing.  6/7 completed abx course for presumed pneumonia     UTI (urinary tract infection) UA able to be collected 07/28/21, and remain negative Patient received antibiotics for concern of pneumonia and left septic hip   Type 2 diabetes mellitus with renal complication (HCC) CBG at 91 this morning.  Currently is staying within goal with some episodes of hypoglycemia intermittently. -Decrease Semglee to 15 units nightly -Decrease mealtime coverage to 3 units -Make sliding scale sensitive -Continue to monitor   ESRD (end stage renal disease) on dialysis [facility]) Nephrology following. Patient need a new confirmed seat before discharge to rehab   TOBACCO DEPENDENCE - Nicotine patch as needed for nicotine craving    Anxiety and depression Amitriptyline 25 mg nightly, Celexa 20 mg daily, duloxetine 60 mg daily   Essential hypertension Amlodipine 5 mg daily, carvedilol 6.25 mg p.o. twice daily Hydralazine 5 mg IV every 6 hours.  For SBP greater than 180,    Hyponatremia Corrected sodium at 132.  Most likely pseudohyponatremia secondary to hyperglycemia. -Continue to monitor   Hyperkalemia Resolved Monitor bmp   Leukocytosis CXR (+) multifocal pneumonia, UA abnormal, also w/u for septic arthritis    Essential hypertension, benign-resolved as of 07/27/2021 - Patient takes amlodipine 5 mg daily, carvedilol 6.25 mg p.o. twice daily, these have been resumed for 07/22/2021 due to low normotensive in setting of requiring hemodialysis - Hydralazine 100 mg 3 times daily have not been resumed due to the same       DVT prophylaxis: scd Code  Status:full Family Communication: none at bedside Disposition Plan:  Status is: Inpatient Remains inpatient appropriate because: awaiting transfer to [facility], bed availability pending        LOS: 16 days   Time spent: 35 minutes    Nolberto Hanlon, MD [facility] Pager [phone]  If 7PM-7AM, please contact night-coverage 08/07/2021, 3:57 PM

## 2021-08-08 DIAGNOSIS — L02818 Cutaneous abscess of other sites: Secondary | ICD-10-CM

## 2021-08-08 DIAGNOSIS — M009 Pyogenic arthritis, unspecified: Secondary | ICD-10-CM | POA: Diagnosis not present

## 2021-08-08 DIAGNOSIS — I10 Essential (primary) hypertension: Secondary | ICD-10-CM

## 2021-08-08 DIAGNOSIS — L02416 Cutaneous abscess of left lower limb: Secondary | ICD-10-CM

## 2021-08-08 DIAGNOSIS — M00859 Arthritis due to other bacteria, unspecified hip: Secondary | ICD-10-CM

## 2021-08-08 DIAGNOSIS — Z992 Dependence on renal dialysis: Secondary | ICD-10-CM

## 2021-08-08 DIAGNOSIS — E1129 Type 2 diabetes mellitus with other diabetic kidney complication: Secondary | ICD-10-CM

## 2021-08-08 DIAGNOSIS — Z794 Long term (current) use of insulin: Secondary | ICD-10-CM

## 2021-08-08 DIAGNOSIS — S72492A Other fracture of lower end of left femur, initial encounter for closed fracture: Secondary | ICD-10-CM

## 2021-08-08 DIAGNOSIS — N186 End stage renal disease: Secondary | ICD-10-CM | POA: Diagnosis not present

## 2021-08-08 LAB — CBC
HCT: 25.9 % — ABNORMAL LOW (ref 36.0–46.0)
Hemoglobin: 8.1 g/dL — ABNORMAL LOW (ref 12.0–15.0)
MCH: 29.5 pg (ref 26.0–34.0)
MCHC: 31.3 g/dL (ref 30.0–36.0)
MCV: 94.2 fL (ref 80.0–100.0)
Platelets: 255 10*3/uL (ref 150–400)
RBC: 2.75 MIL/uL — ABNORMAL LOW (ref 3.87–5.11)
RDW: 15.4 % (ref 11.5–15.5)
WBC: 10.1 10*3/uL (ref 4.0–10.5)
nRBC: 0 % (ref 0.0–0.2)

## 2021-08-08 LAB — RENAL FUNCTION PANEL
Albumin: 2.2 g/dL — ABNORMAL LOW (ref 3.5–5.0)
Anion gap: 12 (ref 5–15)
BUN: 63 mg/dL — ABNORMAL HIGH (ref 6–20)
CO2: 22 mmol/L (ref 22–32)
Calcium: 8.3 mg/dL — ABNORMAL LOW (ref 8.9–10.3)
Chloride: 87 mmol/L — ABNORMAL LOW (ref 98–111)
Creatinine, Ser: 6.62 mg/dL — ABNORMAL HIGH (ref 0.44–1.00)
GFR, Estimated: 7 mL/min — ABNORMAL LOW (ref >60)
Glucose, Bld: 107 mg/dL — ABNORMAL HIGH (ref 70–99)
Phosphorus: 5.6 mg/dL — ABNORMAL HIGH (ref 2.5–4.6)
Potassium: 4.8 mmol/L (ref 3.5–5.1)
Sodium: 121 mmol/L — ABNORMAL LOW (ref 135–145)

## 2021-08-08 LAB — GLUCOSE, CAPILLARY
Glucose-Capillary: 110 mg/dL — ABNORMAL HIGH (ref 70–99)
Glucose-Capillary: 113 mg/dL — ABNORMAL HIGH (ref 70–99)
Glucose-Capillary: 118 mg/dL — ABNORMAL HIGH (ref 70–99)
Glucose-Capillary: 120 mg/dL — ABNORMAL HIGH (ref 70–99)

## 2021-08-08 MED ORDER — PANTOPRAZOLE SODIUM 40 MG PO TBEC
40.0000 mg | DELAYED_RELEASE_TABLET | Freq: Every day | ORAL | Status: DC
  Administered 2021-08-09 – 2021-08-20 (×12): 40 mg via ORAL
  Filled 2021-08-08 (×13): qty 1

## 2021-08-08 MED ORDER — ACETAMINOPHEN 500 MG PO TABS
1000.0000 mg | ORAL_TABLET | Freq: Once | ORAL | Status: AC
  Administered 2021-08-10: 1000 mg via ORAL
  Filled 2021-08-08 (×3): qty 2

## 2021-08-08 MED ORDER — NICOTINE 21 MG/24HR TD PT24: 21.0000 mg | MEDICATED_PATCH | Freq: Every day | TRANSDERMAL | Status: DC | PRN

## 2021-08-08 MED ORDER — CARVEDILOL 6.25 MG PO TABS
6.2500 mg | ORAL_TABLET | Freq: Two times a day (BID) | ORAL | Status: DC
  Administered 2021-08-08 – 2021-08-23 (×27): 6.25 mg via ORAL
  Filled 2021-08-08 (×29): qty 1

## 2021-08-08 MED ORDER — METRONIDAZOLE 500 MG PO TABS
500.0000 mg | ORAL_TABLET | Freq: Two times a day (BID) | ORAL | Status: DC
Start: 2021-08-09 — End: 2021-08-19
  Administered 2021-08-09 – 2021-08-18 (×18): 500 mg via ORAL
  Filled 2021-08-08 (×18): qty 1

## 2021-08-08 MED ORDER — AMLODIPINE BESYLATE 5 MG PO TABS
5.0000 mg | ORAL_TABLET | Freq: Every day | ORAL | Status: DC
  Administered 2021-08-09 – 2021-08-23 (×15): 5 mg via ORAL
  Filled 2021-08-08 (×16): qty 1

## 2021-08-08 MED ORDER — CHLORHEXIDINE GLUCONATE 4 % EX LIQD: 60.0000 mL | Freq: Once | CUTANEOUS | Status: DC

## 2021-08-08 MED ORDER — DARBEPOETIN ALFA 200 MCG/0.4ML IJ SOSY
200.0000 ug | PREFILLED_SYRINGE | Freq: Once | INTRAMUSCULAR | Status: AC
  Administered 2021-08-08: 200 ug via INTRAVENOUS

## 2021-08-08 MED ORDER — SODIUM CHLORIDE 0.9 % IV SOLN
2.0000 g | INTRAVENOUS | Status: DC
  Administered 2021-08-09 – 2021-08-12 (×2): 2 g via INTRAVENOUS
  Filled 2021-08-08 (×2): qty 12.5

## 2021-08-08 MED ORDER — ATORVASTATIN CALCIUM 40 MG PO TABS
40.0000 mg | ORAL_TABLET | Freq: Every day | ORAL | Status: DC
  Administered 2021-08-09 – 2021-08-22 (×14): 40 mg via ORAL
  Filled 2021-08-08 (×14): qty 1

## 2021-08-08 MED ORDER — AMITRIPTYLINE HCL 50 MG PO TABS
25.0000 mg | ORAL_TABLET | Freq: Every day | ORAL | Status: DC
  Administered 2021-08-08 – 2021-08-22 (×15): 25 mg via ORAL
  Filled 2021-08-08 (×14): qty 1

## 2021-08-08 MED ORDER — ALUM & MAG HYDROXIDE-SIMETH 200-200-20 MG/5ML PO SUSP
15.0000 mL | Freq: Once | ORAL | Status: DC
  Administered 2021-08-08: 15 mL via ORAL
  Filled 2021-08-08: qty 30

## 2021-08-08 MED ORDER — CHLORHEXIDINE GLUCONATE CLOTH 2 % EX PADS: 6.0000 | MEDICATED_PAD | Freq: Every day | CUTANEOUS | Status: DC

## 2021-08-08 MED ORDER — ALUM & MAG HYDROXIDE-SIMETH 200-200-20 MG/5ML PO SUSP
15.0000 mL | Freq: Once | ORAL | Status: AC
Start: 2021-08-08 — End: 2021-08-08
  Filled 2021-08-08: qty 30

## 2021-08-08 MED ORDER — DULOXETINE HCL 60 MG PO CPEP
60.0000 mg | ORAL_CAPSULE | Freq: Every day | ORAL | Status: DC
  Administered 2021-08-09 – 2021-08-23 (×15): 60 mg via ORAL
  Filled 2021-08-08 (×16): qty 1

## 2021-08-08 MED ORDER — POLYETHYLENE GLYCOL 3350 17 G PO PACK
17.0000 g | PACK | Freq: Every day | ORAL | Status: DC | PRN
Start: 2021-08-08 — End: 2021-08-10

## 2021-08-08 NOTE — Consult Note (Signed)
[place] for Infectious Disease    Date of Admission:  08/07/2021     Total days of antibiotics 12               Reason for Consult: Prosthetic Joint Infection   Referring Provider: Dr. [place] Primary Care Provider: Julian Hy, PA-C   ASSESSMENT:  [name] is a 56 y/o female with Type 1 diabetes and ESRD on HD (MWF) with previous left intertochanteric fracture s/p intermedullary nail placement in November 5956 now complicated by new left femoral condyle fracture and found to have osteomyelitis and hardware infection of the left hip. Planned for I&D and hardware removal on 08/10/19. Previous cultures from November 2021 with Enterobacter cloacae and Bacteroides fragilis. Currently on Day 10 of meropenem. Recent wound and blood cultures have been without growth to date. Will change meropenem to cefepime and metronidazole. Will need prolonged course of antibiotics which we will arrange for with dialysis. Will request cultures to obtained as able from Orthopedics. Diabetes adequately controlled with A1c of 7.1. Remaining medical and supportive care per primary team.  PLAN:  Change antibiotics to cefepime and metronidazole.  Request surgical cultures as able. I&D and hardware removal with Dr. [name] on 08/09/21.  Remaining medical and supportive care per primary team.    Principal Problem:   Septic arthritis of hip ([place]) Active Problems:   Type 2 diabetes mellitus with renal complication (HCC)   ESRD (end stage renal disease) on dialysis [facility])   Essential hypertension   Closed fracture of left distal femur (HCC)   Skin abscess L hip    amitriptyline  25 mg Oral QHS   amLODipine  5 mg Oral Daily   atorvastatin  40 mg Oral q1800   carvedilol  6.25 mg Oral BID   Chlorhexidine Gluconate Cloth  6 each Topical Q0600   darbepoetin (ARANESP) injection - DIALYSIS  200 mcg Intravenous Once   DULoxetine  60 mg Oral Daily   insulin aspart  0-6 Units Subcutaneous Q4H    pantoprazole  40 mg Oral Daily     HPI: [name] is a 56 y.o. female with previous medical history of Type 1 diabetes complicated by neuropathy, ESRD on HD (MWF), hypertension, left BKA (2021), and CVA transferred to [facility] from [facility] with left hip infection with hardware involvement and left femoral condyle fracture.   [name] presented to the ED on 07/21/21 following a fall while attempting a transfer from chair to wheelchair. Left knee x-ray with comminuted, mildly displaced distal left femoral fracture. Ortho recommended no surgical intervention at the time. Course was complicated by increasing leukocytosis and drainage from her previous surgical site from October 2121 that required intertrochanteric intramedullary nail placement in the left hip. Drainage was chronic and intermittent over the prior year. MRI  left hip on 07/29/21 with increased lucency around the hardware with sinus tract extending to the skin from 3 areas concerning for infection. Orthopedic Surgery recommended removal of hardware however [name] declined. Previous cultures from November 2021 with Enterobacter cloacae and bacteroides fragilis. Blood cultures from 07/22/21 and 07/27/21 are without growth. Wound culture obtained on 07/30/21 with no growth.    [name] was started on Cefepime and changed to meropenem. Currently on Day 10 of meropenem and has remained afebrile with stable to down trending leukocytosis. Initially planned for IR aspiration of left hip that was deferred and was accepted for surgical intervention at [facility]. New CT scan on 6/6/  with extensive lucency are the hip hardware consistent with extensive osteomyelitis and sinus tract to the skin with probable septic arthritis and cellulitis. Plan for I&D and hardware removal with Dr. [name].   Review of Systems: Review of Systems  Constitutional:  Negative for chills, fever and weight loss.  Respiratory:  Negative for cough, shortness of breath and  wheezing.   Cardiovascular:  Negative for chest pain and leg swelling.  Gastrointestinal:  Negative for abdominal pain, constipation, diarrhea, nausea and vomiting.  Skin:  Negative for rash.     Past Medical History:  Diagnosis Date   Anxiety 2002   Chest tightness    Depression 2001   Diabetes type 1, uncontrolled    at age 45   Diabetic neuropathy ([place])    Essential hypertension 2015   GERD (gastroesophageal reflux disease)    about age of 88   Nausea and vomiting in adult    Stroke [facility])    Urinary frequency    Yeast vaginitis     Social History   Tobacco Use   Smoking status: Light Smoker    Packs/day: 0.25    Years: 22.00    Total pack years: 5.50    Types: Cigarettes   Smokeless tobacco: Never  Vaping Use   Vaping Use: Never used  Substance Use Topics   Alcohol use: Yes    Alcohol/week: 0.0 standard drinks of alcohol    Comment: sociial   Drug use: No    Family History  Problem Relation Age of Onset   Diabetes Mother    Heart attack Mother    Heart disease Mother        before age 80   Hypertension Mother    Hyperlipidemia Mother    Diabetes Father    Heart attack Father    Heart disease Father        before age 76   Diabetes Sister    Hypertension Sister     Allergies  Allergen Reactions   Trazodone Swelling   Latex Rash   Lidocaine Itching    OBJECTIVE: Blood pressure (!) 154/64, pulse 66, temperature 98.6 F (37 C), resp. rate 18, height 5\' 10"  (1.778 m), weight 103 kg, SpO2 100 %.  Physical Exam Constitutional:      General: She is not in acute distress.    Appearance: She is well-developed.  Cardiovascular:     Rate and Rhythm: Normal rate and regular rhythm.     Heart sounds: Normal heart sounds.  Pulmonary:     Effort: Pulmonary effort is normal.     Breath sounds: Normal breath sounds.  Skin:    General: Skin is warm and dry.  Neurological:     Mental Status: She is alert and oriented to person, place, and time.   Psychiatric:        Behavior: Behavior normal.        Thought Content: Thought content normal.        Judgment: Judgment normal.     Lab Results Lab Results  Component Value Date   WBC 10.1 08/08/2021   HGB 8.1 (L) 08/08/2021   HCT 25.9 (L) 08/08/2021   MCV 94.2 08/08/2021   PLT 255 08/08/2021    Lab Results  Component Value Date   CREATININE 6.62 (H) 08/08/2021   BUN 63 (H) 08/08/2021   NA 121 (L) 08/08/2021   K 4.8 08/08/2021   CL 87 (L) 08/08/2021   CO2 22 08/08/2021    Lab Results  Component Value Date   ALT 8 07/21/2021   AST 12 (L) 07/21/2021   ALKPHOS 192 (H) 07/21/2021   BILITOT 1.3 (H) 07/21/2021     Microbiology: Recent Results (from the past 240 hour(s))  Aerobic Culture w Gram Stain (superficial specimen)     Status: None   Collection Time: 07/30/21  6:26 PM   Specimen: Wound  Result Value Ref Range Status   Specimen Description   Final    WOUND Performed at [facility], [address] [place], [place] [address]    Special Requests   Final    THIGH Performed at [facility], [address] [place], [place] [address]    Gram Stain   Final    FEW WBC PRESENT, PREDOMINANTLY PMN FEW SQUAMOUS EPITHELIAL CELLS PRESENT NO ORGANISMS SEEN    Culture   Final    NO GROWTH 3 DAYS Performed at [facility], [place] [address] [place], [place] [address]    Report Status 08/02/2021 FINAL  Final  Expectorated Sputum Assessment w Gram Stain, Rflx to Resp Cult     Status: None   Collection Time: 07/31/21  4:18 PM   Specimen: Sputum  Result Value Ref Range Status   Specimen Description SPUTUM  Final   Special Requests NONE  Final   Sputum evaluation   Final    THIS SPECIMEN IS ACCEPTABLE FOR SPUTUM CULTURE Performed at [facility], [address] [place], [place] [address]    Report Status 07/31/2021 FINAL  Final  Culture, Respiratory w Gram Stain     Status: None   Collection Time: 07/31/21  4:18 PM   Specimen:  SPU  Result Value Ref Range Status   Specimen Description SPUTUM  Final   Special Requests NONE Reflexed from [id number]  Final   Gram Stain   Final    NO SQUAMOUS EPITHELIAL CELLS SEEN RARE WBC SEEN RARE GRAM POSITIVE RODS    Culture RARE CANDIDA ALBICANS  Final   Report Status 08/03/2021 FINAL  Final     Terri Piedra, NP [place] for Infectious Disease [facility]  08/08/2021  12:24 PM

## 2021-08-08 NOTE — Consult Note (Signed)
Reason for Consult: Continuity of ESRD care Referring Physician: Eric [place] DO [facility])  HPI:  56 year old woman with past medical history significant for hypertension, insulin-dependent diabetes mellitus, peripheral vascular disease status post left below-knee amputation, anxiety/depression, gastroesophageal reflux disease and end-stage renal disease on hemodialysis.  She was transferred from [facility] yesterday for the advanced management of a chronic left hip infection/hardware involvement and superficial left hip abscess after previously declining surgical intervention.  She reports to have missed a week of dialysis prior to her hospitalization to [place] because of various reasons including pain/discomfort.  She currently denies any chest pain or shortness of breath and reports intermittent nausea without vomiting or diarrhea.  Hemodialysis was attempted yesterday at [facility] but unsuccessful because of difficulty cannulating her left upper arm fistula.  Dialysis prescription: [facility], Monday/Wednesday/Friday, 4 hours, 180 dialyzer, BFR 400/DFR 500, EDW 83 kg, 2K/2.0 calcium, UF profile #2, left upper arm AV fistula, calcitriol 0.25 mcg 3 times weekly, Venofer 50 mg weekly.  No heparin.  Mircera per protocol  Past Medical History:  Diagnosis Date   Anxiety 2002   Chest tightness    Depression 2001   Diabetes type 1, uncontrolled    at age 64   Diabetic neuropathy ([place])    Essential hypertension 2015   GERD (gastroesophageal reflux disease)    about age of 37   Nausea and vomiting in adult    Stroke [facility])    Urinary frequency    Yeast vaginitis     Past Surgical History:  Procedure Laterality Date   ACHILLES TENDON SURGERY Left 03/10/2013   Procedure: LEFT CHOPART AMPUTATION/ LEFT TENDON ACHILLES [place];  Surgeon: Wylene Simmer, MD;  Location: [place];  Service: Orthopedics;  Laterality: Left;   AMPUTATION Left 06/15/2012   Procedure: AMPUTATION  DIGIT;  Surgeon: Meredith Pel, MD;  Location: [place];  Service: Orthopedics;  Laterality: Left;  Left great toe revision amputation   AMPUTATION Left 01/12/2020   Procedure: AMPUTATION BELOW KNEE;  Surgeon: Wylene Simmer, MD;  Location: [place];  Service: Orthopedics;  Laterality: Left;   AV FISTULA PLACEMENT Left 01/09/2020   Procedure: LEFT UPPER ARM ARTERIOVENOUS (AV) FISTULA CREATION;  Surgeon: Cherre Robins, MD;  Location: [place];  Service: Vascular;  Laterality: Left;   ENDOMETRIAL ABLATION     I & D EXTREMITY Left 01/19/2020   Procedure: IRRIGATION AND DEBRIDEMENT Left Hip;  Surgeon: Rod Can, MD;  Location: [place];  Service: Orthopedics;  Laterality: Left;   INSERTION OF DIALYSIS CATHETER Right 01/09/2020   Procedure: INSERTION OF DIALYSIS CATHETER;  Surgeon: Cherre Robins, MD;  Location: [place];  Service: Vascular;  Laterality: Right;   INTRAMEDULLARY (IM) NAIL INTERTROCHANTERIC Left 12/29/2019   Procedure: INTRAMEDULLARY (IM) NAIL INTERTROCHANTRIC;  Surgeon: Rod Can, MD;  Location: [facility];  Service: Orthopedics;  Laterality: Left;   IR ANGIOGRAM EXTREMITY LEFT  12/17/2018   IR FEM POP ART ATHERECT INC PTA MOD SED  12/17/2018   IR RADIOLOGIST EVAL & MGMT  11/30/2018   IR RADIOLOGIST EVAL & MGMT  12/09/2018   IR RADIOLOGIST EVAL & MGMT  02/08/2019   IR US GUIDE VASC ACCESS RIGHT  12/17/2018   LAPAROSCOPIC CHOLECYSTECTOMY  01/30/2015   [facility]     Family History  Problem Relation Age of Onset   Diabetes Mother    Heart attack Mother    Heart disease Mother        before age 17  Hypertension Mother    Hyperlipidemia Mother    Diabetes Father    Heart attack Father    Heart disease Father        before age 2   Diabetes Sister    Hypertension Sister     Social History:  reports that she has been smoking cigarettes. She has a 5.50 pack-year smoking history. She has never used smokeless tobacco. She reports current alcohol use. She reports that she  does not use drugs.  Allergies:  Allergies  Allergen Reactions   Trazodone Swelling   Latex Rash   Lidocaine Itching    Medications: I have reviewed the patient's current medications. Scheduled:  alum & mag hydroxide-simeth  15 mL Oral Once   Chlorhexidine Gluconate Cloth  6 each Topical Q0600   insulin aspart  0-6 Units Subcutaneous Q4H      Latest Ref Rng & Units 08/08/2021    2:36 AM 08/05/2021    9:22 AM 08/02/2021    3:43 PM  BMP  Glucose 70 - 99 mg/dL 107  101    BUN 6 - 20 mg/dL 63  72    Creatinine 0.44 - 1.00 mg/dL 6.62  7.09    Sodium 135 - 145 mmol/L 121  127    Potassium 3.5 - 5.1 mmol/L 4.8  4.6  3.1   Chloride 98 - 111 mmol/L 87  88    CO2 22 - 32 mmol/L 22  26    Calcium 8.9 - 10.3 mg/dL 8.3  8.7        Latest Ref Rng & Units 08/08/2021    2:36 AM 08/05/2021    9:22 AM 08/01/2021    3:33 AM  CBC  WBC 4.0 - 10.5 K/uL 10.1  14.6  14.1   Hemoglobin 12.0 - 15.0 g/dL 8.1  8.7  7.7   Hematocrit 36.0 - 46.0 % 25.9  28.3  25.4   Platelets 150 - 400 K/uL 255  291  277      No results found.  Review of Systems  Constitutional:  Positive for activity change, appetite change and fatigue. Negative for fever.  HENT:  Negative for sinus pressure, sore throat and trouble swallowing.   Respiratory:  Negative for cough and shortness of breath.   Cardiovascular:  Negative for chest pain and leg swelling.  Gastrointestinal:  Positive for nausea. Negative for abdominal pain, diarrhea and vomiting.  Endocrine: Positive for polydipsia. Negative for polyphagia and polyuria.  Genitourinary:  Negative for dysuria, frequency, hematuria and pelvic pain.  Musculoskeletal:  Positive for arthralgias and myalgias.       Left hip  Neurological:  Positive for weakness and headaches. Negative for dizziness.   Blood pressure (!) 154/64, pulse 66, temperature 98.6 F (37 C), resp. rate 18, height 5\' 10"  (1.778 m), weight 103 kg, SpO2 100 %. Physical Exam Vitals and nursing note reviewed.   Constitutional:      General: She is not in acute distress.    Appearance: Normal appearance. She is obese. She is ill-appearing.  HENT:     Head: Normocephalic and atraumatic.     Right Ear: External ear normal.     Left Ear: External ear normal.     Nose: Nose normal. No congestion.     Mouth/Throat:     Mouth: Mucous membranes are moist.     Pharynx: Oropharynx is clear.  Eyes:     General: No scleral icterus.    Extraocular Movements: Extraocular movements intact.  Conjunctiva/sclera: Conjunctivae normal.  Cardiovascular:     Rate and Rhythm: Normal rate and regular rhythm.     Pulses: Normal pulses.     Heart sounds: Normal heart sounds.  Pulmonary:     Effort: Pulmonary effort is normal.     Breath sounds: Normal breath sounds. No wheezing or rales.  Abdominal:     General: Abdomen is flat. Bowel sounds are normal.     Palpations: Abdomen is soft.     Tenderness: There is no abdominal tenderness. There is no guarding.  Musculoskeletal:     Cervical back: Normal range of motion and neck supple.     Right lower leg: Edema present.     Comments: Trace-1+ right lower extremity edema, status post left BKA.  Left upper arm AV fistula with intact dressings, cannulation sites appear to be slightly lateral to course of fistula  Skin:    General: Skin is warm and dry.     Findings: No lesion.  Neurological:     Mental Status: She is alert and oriented to person, place, and time.  Psychiatric:        Mood and Affect: Mood normal.     Assessment/Plan: 1.  Left hip infection with hardware involvement and recent left femoral condyle fracture: Plans noted by orthopedic surgery for incision and drainage/irrigation and hardware removal with implantation of antibiotic beads likely tomorrow.  She is currently on antibiotics with meropenem based on previous sensitivity patterns. 2.  End-stage renal disease: Unable to get hemodialysis yesterday and will be scheduled for dialysis today  and then again tomorrow to continue her usual Monday/Wednesday/Friday outpatient schedule.  Hepatitis studies from 5/31 done at [facility] were negative. 3.  Hypertension: We will undertake hemodialysis today with ultrafiltration and monitor current blood pressure trends. 4.  Anemia: Likely secondary to chronic illness however with significant inflammatory complex from ongoing infection likely leading to worsening hemoglobin/hematocrit.  We will give a dose of ESA today and monitor for PRBC needs if hemoglobin count drops to <7.0. 5.  Chronic kidney disease-metabolic bone disease: Continue renal diet with phosphorus restriction and monitor phosphorus/calcium trend on labs.  Her corrected calcium level is borderline high with mildly elevated phosphorus.  [name] K. 08/08/2021, 10:40 AM

## 2021-08-08 NOTE — Progress Notes (Signed)
Pharmacy Antibiotic Note  [name] is a 56 y.o. female admitted on 08/07/2021 with L-hip PJI. Pharmacy has been consulted for Cefepime dosing.  ESRD planning antibiotics s/p I*D for L-hip PJI. Prior cultures grew Enterobacter + Bacteroides. Planning to adjust regiment to Cefepime + Flagyl for better ease of administration with HD. ESRD-MWF, planning HD today and again tomorrow to get back on schedule.   Plan: - D/c Meropenem after today's dose - Start Cefepime 2g post HD on MWF starting on 6/9 PM - Will plan OPAT for abx to be given with HD    Height: 5\' 10"  (177.8 cm) Weight: 103 kg (227 lb 1.2 oz) IBW/kg (Calculated) : 68.5  Temp (24hrs), Avg:98 F (36.7 C), Min:97.5 F (36.4 C), Max:98.6 F (37 C)  Recent Labs  Lab 08/05/21 0922 08/08/21 0236  WBC 14.6* 10.1  CREATININE 7.09* 6.62*    Estimated Creatinine Clearance: 12.6 mL/min (A) (by C-G formula based on SCr of 6.62 mg/dL (H)).    Allergies  Allergen Reactions   Trazodone Swelling   Latex Rash   Lidocaine Itching    Thank you for allowing pharmacy to be a part of this patient's care.  Alycia Rossetti, PharmD, BCPS Infectious Diseases Clinical Pharmacist 08/08/2021 12:39 PM   **Pharmacist phone directory can now be found on [email or web address] ([email or web address]).  Listed under [place].

## 2021-08-08 NOTE — Progress Notes (Incomplete)
PHARMACY CONSULT NOTE FOR:  OUTPATIENT  PARENTERAL ANTIBIOTIC THERAPY (OPAT)  Indication:  Regimen:  End date:   IV antibiotic discharge orders are pended. To discharging provider:  please sign these orders via discharge navigator,  Select New Orders & click on the button choice - Manage This Unsigned Work.     Thank you for allowing pharmacy to be a part of this patient's care.  Lawson Radar 08/08/2021, 11:37 AM

## 2021-08-08 NOTE — Progress Notes (Signed)
PROGRESS NOTE    [name]  LPF:[id number] DOB: [birth year] DOA: 08/07/2021 PCP: Julian Hy, PA-C    Brief Narrative:   [name] is a 56 y.o. female with past medical history significant for HTN, HLD, ESRD on HD MWF, history of left BKA 2021, depression/anxiety, history of CVA, tobacco use disorder, type 2 diabetes mellitus, insulin-dependent, insomnia, chronic pain syndrome who presented to [facility] on 6/7 via transfer from [facility] for distal left femur fracture secondary to mechanical fall, deemed nonoperative per [facility] orthopedics.  During this hospital course, patient was treated for multifocal pneumonia [place], started on antibiotics on 5/27 in which she finished a course on 6/7.  Urine culture, sputum culture, blood cultures all remain negative.  Underwent drainage of superficial abscess left hip on 5/28, culture negative.  MRI left hip concerning for 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  Orthopedics initially recommended I&D, hardware removal and placement of antibiotic beads but patient declined surgery.  Was seen by infectious disease during her hospitalization who recommended I&D but orthopedics felt infection would recur and she would need a very extensive debridement.  IR was also consulted but patient declined CT-guided drainage procedure requested surgical opinion from her primary orthopedist, Dr. Lyla Martinez who accepted the patient for transfer to [facility] under the hospital service.  Patient was initially treated with cefepime but changed to meropenem due to prior history of resistant Enterobacter and Bacteroides.  Assessment & Plan:   Left hip septic arthritis/osteomyelitis, abscess, hardware involvement Patient presenting as a transfer from [facility] after imaging studies notable for ununited left hip fracture with extensive lucency around hip hardware consistent with extensive  osteomyelitis, 6 cm posterior fluid collection concerning for abscess that appears with a sinus track to skin with probable septic arthritis, mild fasciitis, cellulitis.  Orthopedics at [facility] initially recommended I&D with hardware removal and placement of antibiotic beads but patient declined surgery.  IR was also consulted patient declined CT-guided drainage of the procedure requested surgical opinion from her primary orthopedist Dr. Lyla Martinez.  Patient was initially treated with cefepime but changed to meropenem by infectious disease on 5/30 due to prior history of resistant Enterobacter and Bacteroides. --Blood Cultures 5/27 x 2: No growth x5 days --Superficial wound culture 5/30: Ism seen on Gram stain, no growth x3 days --Meropenem 1 g IV every 8 hours (start 5/30) --Orthopedics plans I&D, hardware removal, antibiotic bead placement on 08/09/2021, n.p.o. after midnight --Infectious disease consult for assistance with antibiotic management  Left femoral condyle fracture Evaluated by orthopedics, currently plan nonoperative management. --Nonweightbearing left lower extremity --Pain control  Skin abscess left hip Previously I indeed with copious purulent fluid obtained, wound culture negative. --Continue local wound care  Hyponatremia Sodium 121 on admission 6/7, has missed several HD sessions prior, did not receive dialysis yesterday.  Likely secondary to hypervolemic hyponatremia. --For HD today --Repeat renal panel in a.m.  Type 2 diabetes mellitus, insulin-dependent Hemoglobin A1c 7.1 on 07/22/2021, well controlled.   --Hold long-acting insulin for now --very Sensitive SSI for coverage --CBGs qAC/HS  ESRD on HD MWF --Nephrology consulted for continued HD while inpatient  Depression/Anxiety --Amitriptyline 25 mg p.o. nightly --Duloxetine 60 mg p.o. daily  Essential hypertension --Amlodipine 5 mg p.o. daily --Carvedilol 6.25 mg p.o. twice daily --Holding home  hydralazine --Continue to monitor BP closely  Obesity Body mass index is 32.58 kg/m.  Discussed with patient needs for aggressive lifestyle changes/weight loss as this  complicates all facets of care.  Outpatient follow-up with PCP.      DVT prophylaxis: SCDs Start: 08/07/21 2226    Code Status: Full Code Family Communication:   Disposition Plan:  Level of care: Med-Surg Status is: Inpatient Remains inpatient appropriate because: Pending surgical invention for septic arthritis, osteomyelitis, infected hardware, abscess left hip    Consultants:  Orthopedics, Dr. [name] Nephrology Infectious disease  Procedures:  None  Antimicrobials:  Azithromycin 5/21 - 5/30 Cefepime 5/21 - 5/28 Meropenem 5/30>>   Subjective: Patient seen examined bedside, resting comfortably.  Lying in bed.  Pain currently controlled.  Discussed with patient that Dr. Lyla Martinez or one of his colleagues will evaluate her this morning with likely plan of surgical invention.  No other specific questions or concerns at this time.  Denies headache, no fever/chills/night sweats, no nausea/vomiting/diarrhea, no chest pain, no palpitations, no shortness of breath, no abdominal pain, no focal weakness, no fatigue, no paresthesias.  No acute events overnight per nursing staff.  Objective: Vitals:   08/07/21 2020 08/07/21 2348 08/08/21 0424 08/08/21 0831  BP: (!) 131/54 (!) 136/50 (!) 148/75 (!) 154/64  Pulse: 60 62 (!) 59 66  Resp: 18 16 16 18   Temp: 98 F (36.7 C) (!) 97.5 F (36.4 C) 97.7 F (36.5 C) 98.6 F (37 C)  TempSrc: Oral Oral Oral   SpO2: 98% 98% 99% 100%  Weight: 103 kg     Height: 5\' 10"  (1.778 m)       Intake/Output Summary (Last 24 hours) at 08/08/2021 1059 Last data filed at 08/08/2021 0800 Gross per 24 hour  Intake 120 ml  Output --  Net 120 ml   Filed Weights   08/07/21 2020  Weight: 103 kg    Examination:  Physical Exam: GEN: NAD, alert and oriented x 3, chronically ill in  appearance, appears older than stated age, obese HEENT: NCAT, PERRL, EOMI, sclera clear, MMM PULM: CTAB w/o wheezes/crackles, normal respiratory effort, on room air CV: RRR w/o M/G/R GI: abd soft, NTND, NABS, no R/G/M MSK: Left BKA noted, left thigh with dressing in place with purulent discharge, moves all extremities independently with preserved muscle strength NEURO: CN II-XII intact, no focal deficits, sensation to light touch intact PSYCH: normal mood/affect Integumentary: Left thigh wound as above, otherwise no other concerning rashes/lesions/wounds     Data Reviewed: I have personally reviewed following labs and imaging studies  CBC: Recent Labs  Lab 08/05/21 0922 08/08/21 0236  WBC 14.6* 10.1  HGB 8.7* 8.1*  HCT 28.3* 25.9*  MCV 94.0 94.2  PLT 291 937   Basic Metabolic Panel: Recent Labs  Lab 08/02/21 1543 08/05/21 0922 08/08/21 0236  NA  --  127* 121*  K 3.1* 4.6 4.8  CL  --  88* 87*  CO2  --  26 22  GLUCOSE  --  101* 107*  BUN  --  72* 63*  CREATININE  --  7.09* 6.62*  CALCIUM  --  8.7* 8.3*  PHOS  --  4.9* 5.6*   GFR: Estimated Creatinine Clearance: 12.6 mL/min (A) (by C-G formula based on SCr of 6.62 mg/dL (H)). Liver Function Tests: Recent Labs  Lab 08/05/21 0922 08/08/21 0236  ALBUMIN 2.5* 2.2*   No results for input(s): "LIPASE", "AMYLASE" in the last 168 hours. No results for input(s): "AMMONIA" in the last 168 hours. Coagulation Profile: No results for input(s): "INR", "PROTIME" in the last 168 hours. Cardiac Enzymes: No results for input(s): "CKTOTAL", "CKMB", "CKMBINDEX", "TROPONINI" in  the last 168 hours. BNP (last 3 results) No results for input(s): "PROBNP" in the last 8760 hours. HbA1C: No results for input(s): "HGBA1C" in the last 72 hours. CBG: Recent Labs  Lab 08/07/21 1125 08/07/21 1559 08/07/21 2348 08/08/21 0424 08/08/21 0748  GLUCAP 99 83 93 110* 113*   Lipid Profile: No results for input(s): "CHOL", "HDL", "LDLCALC",  "TRIG", "CHOLHDL", "LDLDIRECT" in the last 72 hours. Thyroid Function Tests: No results for input(s): "TSH", "T4TOTAL", "FREET4", "T3FREE", "THYROIDAB" in the last 72 hours. Anemia Panel: No results for input(s): "VITAMINB12", "FOLATE", "FERRITIN", "TIBC", "IRON", "RETICCTPCT" in the last 72 hours. Sepsis Labs: No results for input(s): "PROCALCITON", "LATICACIDVEN" in the last 168 hours.  Recent Results (from the past 240 hour(s))  Aerobic Culture w Gram Stain (superficial specimen)     Status: None   Collection Time: 07/30/21  6:26 PM   Specimen: Wound  Result Value Ref Range Status   Specimen Description   Final    WOUND Performed at [facility], [address] [place], [place] [address]    Special Requests   Final    THIGH Performed at [facility], [address] [place], [place] [address]    Gram Stain   Final    FEW WBC PRESENT, PREDOMINANTLY PMN FEW SQUAMOUS EPITHELIAL CELLS PRESENT NO ORGANISMS SEEN    Culture   Final    NO GROWTH 3 DAYS Performed at [facility], [place] [address] [place], [place] [address]    Report Status 08/02/2021 FINAL  Final  Expectorated Sputum Assessment w Gram Stain, Rflx to Resp Cult     Status: None   Collection Time: 07/31/21  4:18 PM   Specimen: Sputum  Result Value Ref Range Status   Specimen Description SPUTUM  Final   Special Requests NONE  Final   Sputum evaluation   Final    THIS SPECIMEN IS ACCEPTABLE FOR SPUTUM CULTURE Performed at [facility], [address], [place], [place] [address]    Report Status 07/31/2021 FINAL  Final  Culture, Respiratory w Gram Stain     Status: None   Collection Time: 07/31/21  4:18 PM   Specimen: SPU  Result Value Ref Range Status   Specimen Description SPUTUM  Final   Special Requests NONE Reflexed from [id number]  Final   Gram Stain   Final    NO SQUAMOUS EPITHELIAL CELLS SEEN RARE WBC SEEN RARE GRAM POSITIVE RODS    Culture RARE CANDIDA ALBICANS  Final    Report Status 08/03/2021 FINAL  Final         Radiology Studies: No results found.      Scheduled Meds:  alum & mag hydroxide-simeth  15 mL Oral Once   Chlorhexidine Gluconate Cloth  6 each Topical Q0600   darbepoetin (ARANESP) injection - DIALYSIS  200 mcg Intravenous Once   insulin aspart  0-6 Units Subcutaneous Q4H   Continuous Infusions:  meropenem (MERREM) IV       LOS: 1 day    Time spent: 52 minutes spent on chart review, discussion with nursing staff, consultants, updating family and interview/physical exam; more than 50% of that time was spent in counseling and/or coordination of care.    Seriah Brotzman J [place] DO [facility] Available via Epic secure chat 7am-7pm After these hours, please refer to coverage provider listed on [email or web address] 08/08/2021, 10:59 AM

## 2021-08-08 NOTE — Consult Note (Signed)
[place] Nurse has reviewed record and this patient has a positive xray or MRI for osteomyelitis, this is considered outside of the scope of practice for the [place] nurse, for that reason [place] Nurse will not consult.   To be followed by ortho, I have provided topical wound care to manage exudate and for protection.   Re-consult if only topical wound care needed after orthopedic or surgical evaluation Emannuel V[facility] MSN, RN, [place], [place], [place]

## 2021-08-08 NOTE — Plan of Care (Signed)

## 2021-08-08 NOTE — Consult Note (Signed)
Reason for Consult:Left hip infection Referring Physician: Eric [place] Time called: 0865 Time at bedside: [name] is an 56 y.o. female.  HPI: [name] fell about 10d ago fractured her femoral condyle. She was seen at [facility] who recommended non-operative treatment. 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 Ortho recommended I&D and hardware removal that she initially refused but then requested transfer to [facility] for consultation with operative surgeon.   Past Medical History:  Diagnosis Date   Anxiety 2002   Chest tightness    Depression 2001   Diabetes type 1, uncontrolled    at age 89   Diabetic neuropathy ([place])    Essential hypertension 2015   GERD (gastroesophageal reflux disease)    about age of 69   Nausea and vomiting in adult    Stroke [facility])    Urinary frequency    Yeast vaginitis     Past Surgical History:  Procedure Laterality Date   ACHILLES TENDON SURGERY Left 03/10/2013   Procedure: LEFT CHOPART AMPUTATION/ LEFT TENDON ACHILLES [place];  Surgeon: Wylene Simmer, MD;  Location: [place];  Service: Orthopedics;  Laterality: Left;   AMPUTATION Left 06/15/2012   Procedure: AMPUTATION DIGIT;  Surgeon: Meredith Pel, MD;  Location: [place];  Service: Orthopedics;  Laterality: Left;  Left great toe revision amputation   AMPUTATION Left 01/12/2020   Procedure: AMPUTATION BELOW KNEE;  Surgeon: Wylene Simmer, MD;  Location: [place];  Service: Orthopedics;  Laterality: Left;   AV FISTULA PLACEMENT Left 01/09/2020   Procedure: LEFT UPPER ARM ARTERIOVENOUS (AV) FISTULA CREATION;  Surgeon: Cherre Robins, MD;  Location: [place];  Service: Vascular;  Laterality: Left;   ENDOMETRIAL ABLATION     I & D EXTREMITY Left 01/19/2020   Procedure: IRRIGATION AND DEBRIDEMENT Left Hip;  Surgeon: Rod Can, MD;  Location: [place];  Service: Orthopedics;   Laterality: Left;   INSERTION OF DIALYSIS CATHETER Right 01/09/2020   Procedure: INSERTION OF DIALYSIS CATHETER;  Surgeon: Cherre Robins, MD;  Location: [place];  Service: Vascular;  Laterality: Right;   INTRAMEDULLARY (IM) NAIL INTERTROCHANTERIC Left 12/29/2019   Procedure: INTRAMEDULLARY (IM) NAIL INTERTROCHANTRIC;  Surgeon: Rod Can, MD;  Location: [facility];  Service: Orthopedics;  Laterality: Left;   IR ANGIOGRAM EXTREMITY LEFT  12/17/2018   IR FEM POP ART ATHERECT INC PTA MOD SED  12/17/2018   IR RADIOLOGIST EVAL & MGMT  11/30/2018   IR RADIOLOGIST EVAL & MGMT  12/09/2018   IR RADIOLOGIST EVAL & MGMT  02/08/2019   IR US GUIDE VASC ACCESS RIGHT  12/17/2018   LAPAROSCOPIC CHOLECYSTECTOMY  01/30/2015   [facility]     Family History  Problem Relation Age of Onset   Diabetes Mother    Heart attack Mother    Heart disease Mother        before age 58   Hypertension Mother    Hyperlipidemia Mother    Diabetes Father    Heart attack Father    Heart disease Father        before age 35   Diabetes Sister    Hypertension Sister     Social History:  reports that she has been smoking cigarettes. She has a 5.50 pack-year smoking history. She has never used smokeless tobacco. She reports current alcohol use. She reports that she does not use drugs.  Allergies:  Allergies  Allergen Reactions   Trazodone Swelling   Latex Rash   Lidocaine Itching    Medications: I have reviewed the patient's current medications.  Results for orders placed or performed during the hospital encounter of 08/07/21 (from the past 48 hour(s))  Glucose, capillary     Status: None   Collection Time: 08/07/21 11:48 PM  Result Value Ref Range   Glucose-Capillary 93 70 - 99 mg/dL    Comment: Glucose reference range applies only to samples taken after fasting for at least 8 hours.  CBC     Status: Abnormal   Collection Time: 08/08/21  2:36 AM  Result Value Ref Range   WBC 10.1 4.0 - 10.5 K/uL   RBC  2.75 (L) 3.87 - 5.11 MIL/uL   Hemoglobin 8.1 (L) 12.0 - 15.0 g/dL   HCT 25.9 (L) 36.0 - 46.0 %   MCV 94.2 80.0 - 100.0 fL   MCH 29.5 26.0 - 34.0 pg   MCHC 31.3 30.0 - 36.0 g/dL   RDW 15.4 11.5 - 15.5 %   Platelets 255 150 - 400 K/uL   nRBC 0.0 0.0 - 0.2 %    Comment: Performed at [facility], [place] [address] [place], [place] [address]  Renal function panel     Status: Abnormal   Collection Time: 08/08/21  2:36 AM  Result Value Ref Range   Sodium 121 (L) 135 - 145 mmol/L   Potassium 4.8 3.5 - 5.1 mmol/L   Chloride 87 (L) 98 - 111 mmol/L   CO2 22 22 - 32 mmol/L   Glucose, Bld 107 (H) 70 - 99 mg/dL    Comment: Glucose reference range applies only to samples taken after fasting for at least 8 hours.   BUN 63 (H) 6 - 20 mg/dL   Creatinine, Ser 6.62 (H) 0.44 - 1.00 mg/dL   Calcium 8.3 (L) 8.9 - 10.3 mg/dL   Phosphorus 5.6 (H) 2.5 - 4.6 mg/dL   Albumin 2.2 (L) 3.5 - 5.0 g/dL   GFR, Estimated 7 (L) >60 mL/min    Comment: (NOTE) Calculated using the CKD-EPI Creatinine Equation (2021)    Anion gap 12 5 - 15    Comment: Performed at [facility] [address], [place], [place] [address]  Glucose, capillary     Status: Abnormal   Collection Time: 08/08/21  4:24 AM  Result Value Ref Range   Glucose-Capillary 110 (H) 70 - 99 mg/dL    Comment: Glucose reference range applies only to samples taken after fasting for at least 8 hours.  Glucose, capillary     Status: Abnormal   Collection Time: 08/08/21  7:48 AM  Result Value Ref Range   Glucose-Capillary 113 (H) 70 - 99 mg/dL    Comment: Glucose reference range applies only to samples taken after fasting for at least 8 hours.    No results found.  Review of Systems  HENT:  Negative for ear discharge, ear pain, hearing loss and tinnitus.   Eyes:  Negative for photophobia and pain.  Respiratory:  Negative for cough and shortness of breath.   Cardiovascular:  Negative for chest pain.  Gastrointestinal:  Negative for  abdominal pain, nausea and vomiting.  Genitourinary:  Negative for dysuria, flank pain, frequency and urgency.  Musculoskeletal:  Positive for arthralgias (Left hip and knee). Negative for back pain, myalgias and neck pain.  Neurological:  Negative for dizziness and headaches.  Hematological:  Does not bruise/bleed easily.  Psychiatric/Behavioral:  The patient is not nervous/anxious.    Blood pressure (!) 154/64, pulse 66, temperature 98.6 F (37 C), resp. rate 18, height 5\' 10"  (1.778 m), weight 103 kg, SpO2 100 %. Physical Exam Constitutional:      General: She is not in acute distress.    Appearance: She is well-developed. She is not diaphoretic.  HENT:     Head: Normocephalic and atraumatic.  Eyes:     General: No scleral icterus.       Right eye: No discharge.        Left eye: No discharge.     Conjunctiva/sclera: Conjunctivae normal.  Cardiovascular:     Rate and Rhythm: Normal rate and regular rhythm.  Pulmonary:     Effort: Pulmonary effort is normal. No respiratory distress.  Musculoskeletal:     Cervical back: Normal range of motion.     Comments: LLE No traumatic wounds, ecchymosis, or rash  S/p BKA, draining hip wound  No knee or ankle effusion  Skin:    General: Skin is warm and dry.  Neurological:     Mental Status: She is alert.  Psychiatric:        Mood and Affect: Mood normal.        Behavior: Behavior normal.     Assessment/Plan: Left hip infection with hardware involvement -- Plan I&D and hardware removal by Dr. [name] tomorrow. Please keep NPO after MN. Left femoral condyle fx -- Plan non-operative management. Dr. [name] to evaluate later today or in AM.    Lisette Abu, PA-C Orthopedic Surgery [phone] 08/08/2021, 10:06 AM

## 2021-08-09 DIAGNOSIS — I10 Essential (primary) hypertension: Secondary | ICD-10-CM | POA: Diagnosis not present

## 2021-08-09 DIAGNOSIS — M00859 Arthritis due to other bacteria, unspecified hip: Secondary | ICD-10-CM | POA: Diagnosis not present

## 2021-08-09 DIAGNOSIS — S72492A Other fracture of lower end of left femur, initial encounter for closed fracture: Secondary | ICD-10-CM | POA: Diagnosis not present

## 2021-08-09 DIAGNOSIS — N186 End stage renal disease: Secondary | ICD-10-CM | POA: Diagnosis not present

## 2021-08-09 LAB — CBC
HCT: 24.9 % — ABNORMAL LOW (ref 36.0–46.0)
Hemoglobin: 8 g/dL — ABNORMAL LOW (ref 12.0–15.0)
MCH: 30.2 pg (ref 26.0–34.0)
MCHC: 32.1 g/dL (ref 30.0–36.0)
MCV: 94 fL (ref 80.0–100.0)
Platelets: 212 10*3/uL (ref 150–400)
RBC: 2.65 MIL/uL — ABNORMAL LOW (ref 3.87–5.11)
RDW: 15.8 % — ABNORMAL HIGH (ref 11.5–15.5)
WBC: 8 10*3/uL (ref 4.0–10.5)
nRBC: 0 % (ref 0.0–0.2)

## 2021-08-09 LAB — RENAL FUNCTION PANEL
Albumin: 2.1 g/dL — ABNORMAL LOW (ref 3.5–5.0)
Anion gap: 8 (ref 5–15)
BUN: 36 mg/dL — ABNORMAL HIGH (ref 6–20)
CO2: 23 mmol/L (ref 22–32)
Calcium: 7.7 mg/dL — ABNORMAL LOW (ref 8.9–10.3)
Chloride: 95 mmol/L — ABNORMAL LOW (ref 98–111)
Creatinine, Ser: 4.53 mg/dL — ABNORMAL HIGH (ref 0.44–1.00)
GFR, Estimated: 11 mL/min — ABNORMAL LOW (ref >60)
Glucose, Bld: 137 mg/dL — ABNORMAL HIGH (ref 70–99)
Phosphorus: 4.6 mg/dL (ref 2.5–4.6)
Potassium: 4.3 mmol/L (ref 3.5–5.1)
Sodium: 126 mmol/L — ABNORMAL LOW (ref 135–145)

## 2021-08-09 LAB — GLUCOSE, CAPILLARY
Glucose-Capillary: 103 mg/dL — ABNORMAL HIGH (ref 70–99)
Glucose-Capillary: 129 mg/dL — ABNORMAL HIGH (ref 70–99)
Glucose-Capillary: 136 mg/dL — ABNORMAL HIGH (ref 70–99)
Glucose-Capillary: 139 mg/dL — ABNORMAL HIGH (ref 70–99)
Glucose-Capillary: 199 mg/dL — ABNORMAL HIGH (ref 70–99)
Glucose-Capillary: 215 mg/dL — ABNORMAL HIGH (ref 70–99)

## 2021-08-09 LAB — SURGICAL PCR SCREEN
MRSA, PCR: NEGATIVE
Staphylococcus aureus: NEGATIVE

## 2021-08-09 NOTE — Procedures (Signed)
Patient seen on Hemodialysis. BP (!) 152/58   Pulse 64   Temp 98 F (36.7 C) (Oral)   Resp 12   Ht 5\' 10"  (1.778 m)   Wt 99.4 kg   SpO2 97%   BMI 31.44 kg/m   QB 400, UF goal 2L Tolerating treatment without complaints at this time.   Elmarie Shiley MD [facility]. Office # [phone] Pager # [phone] 9:41 AM

## 2021-08-09 NOTE — Progress Notes (Signed)
PROGRESS NOTE    [name]  YBW:[id number] DOB: Nov 08, 1965 DOA: 08/07/2021 PCP: Julian Hy, PA-C    Brief Narrative:   [name] is a 56 y.o. female with past medical history significant for HTN, HLD, ESRD on HD MWF, history of left BKA 2021, depression/anxiety, history of CVA, tobacco use disorder, type 2 diabetes mellitus, insulin-dependent, insomnia, chronic pain syndrome who presented to [facility] on 6/7 via transfer from [facility] for distal left femur fracture secondary to mechanical fall, deemed nonoperative per [facility] orthopedics.  During this hospital course, patient was treated for multifocal pneumonia Ona, started on antibiotics on 5/27 in which she finished a course on 6/7.  Urine culture, sputum culture, blood cultures all remain negative.  Underwent drainage of superficial abscess left hip on 5/28, culture negative.  MRI left hip concerning for 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  Orthopedics initially recommended I&D, hardware removal and placement of antibiotic beads but patient declined surgery.  Was seen by infectious disease during her hospitalization who recommended I&D but orthopedics felt infection would recur and she would need a very extensive debridement.  IR was also consulted but patient declined CT-guided drainage procedure requested surgical opinion from her primary orthopedist, Dr. Lyla Glassing who accepted the patient for transfer to [facility] under the hospital service.  Patient was initially treated with cefepime but changed to meropenem due to prior history of resistant Enterobacter and Bacteroides.  Assessment & Plan:   Left hip septic arthritis/osteomyelitis, abscess, hardware involvement Patient presenting as a transfer from [facility] after imaging studies notable for ununited left hip fracture with extensive lucency around hip hardware consistent with extensive  osteomyelitis, 6 cm posterior fluid collection concerning for abscess that appears with a sinus track to skin with probable septic arthritis, mild fasciitis, cellulitis.  Orthopedics at [facility] initially recommended I&D with hardware removal and placement of antibiotic beads but patient declined surgery.  IR was also consulted patient declined CT-guided drainage of the procedure requested surgical opinion from her primary orthopedist Dr. Lyla Glassing.  Patient was initially treated with cefepime but changed to meropenem by infectious disease on 5/30 due to prior history of resistant Enterobacter and Bacteroides. --Orthopedic/infectious disease following, appreciate assistance --Blood Cultures 5/27 x 2: No growth x 5 days --Superficial wound culture 5/30: no organism seen on Gram stain, no growth x3 days --Cefepime 2 g IV every Monday/Wednesday/Friday --Metronidazole 500 mg PO q12h --Orthopedics plans I&D, hardware removal, antibiotic bead placement on 08/09/2021, n.p.o.  Left femoral condyle fracture Evaluated by orthopedics, currently plan nonoperative management. --Nonweightbearing left lower extremity --Pain control  Skin abscess left hip Previously I&D with copious purulent fluid obtained, wound culture negative. --Continue local wound care  Hyponatremia Sodium 121 on admission 6/7, has missed several HD sessions prior, did not receive dialysis yesterday.  Likely secondary to hypervolemic hyponatremia. --Na 121>126 --For HD today --Repeat renal panel in a.m.  Type 2 diabetes mellitus, insulin-dependent Hemoglobin A1c 7.1 on 07/22/2021, well controlled.   --Hold long-acting insulin for now --very Sensitive SSI for coverage --CBGs qAC/HS  ESRD on HD MWF --Nephrology consulted for continued HD while inpatient  Depression/Anxiety --Amitriptyline 25 mg p.o. nightly --Duloxetine 60 mg p.o. daily  Essential hypertension --Amlodipine 5 mg p.o. daily --Carvedilol 6.25 mg p.o. twice  daily --Holding home hydralazine --Continue to monitor BP closely  Obesity Body mass index is 31.44 kg/m.  Discussed with patient needs for aggressive lifestyle changes/weight loss as this  complicates all facets of care.  Outpatient follow-up with PCP.      DVT prophylaxis: SCDs Start: 08/07/21 2226    Code Status: Full Code Family Communication:   Disposition Plan:  Level of care: Med-Surg Status is: Inpatient Remains inpatient appropriate because: Pending surgical invention for septic arthritis, osteomyelitis, infected hardware, abscess left hip today, will likely need long-term antibiotics, ID following hopefully with HD    Consultants:  Orthopedics, Dr. [name] Nephrology Infectious disease  Procedures:  None  Antimicrobials:  Azithromycin 5/21 - 5/30 Cefepime 5/21 - 5/28 Meropenem 5/30>>   Subjective: Patient seen examined bedside, resting comfortably.  Currently on HD undergoing dialysis.  Asking when she would likely be able to go home following surgery.  Pending surgery this afternoon, ID following with plan antibiotics with hemodialysis outpatient.  No other specific questions or concerns at this time.  Denies headache, no fever/chills/night sweats, no nausea/vomiting/diarrhea, no chest pain, no palpitations, no shortness of breath, no abdominal pain, no focal weakness, no fatigue, no paresthesias.  No acute events overnight per nursing staff.  Objective: Vitals:   08/09/21 0825 08/09/21 0900 08/09/21 0930 08/09/21 1000  BP: (!) 145/57 (!) 153/58 (!) 152/58 (!) 145/65  Pulse: (!) 56 62 64 63  Resp: 11 12 12 12   Temp:      TempSrc:      SpO2:  98% 97% 99%  Weight:      Height:        Intake/Output Summary (Last 24 hours) at 08/09/2021 1006 Last data filed at 08/08/2021 1300 Gross per 24 hour  Intake 120 ml  Output --  Net 120 ml   Filed Weights   08/07/21 2020 08/08/21 1454 08/09/21 0800  Weight: 103 kg 104.3 kg 99.4 kg    Examination:  Physical  Exam: GEN: NAD, alert and oriented x 3, chronically ill in appearance, appears older than stated age, obese HEENT: NCAT, PERRL, EOMI, sclera clear, MMM PULM: CTAB w/o wheezes/crackles, normal respiratory effort, on room air CV: RRR w/o M/G/R GI: abd soft, NTND, NABS, no R/G/M MSK: Left BKA noted, left thigh with dressing in place with purulent discharge, moves all extremities independently with preserved muscle strength NEURO: CN II-XII intact, no focal deficits, sensation to light touch intact PSYCH: normal mood/affect Integumentary: Left thigh wound as above, otherwise no other concerning rashes/lesions/wounds     Data Reviewed: I have personally reviewed following labs and imaging studies  CBC: Recent Labs  Lab 08/05/21 0922 08/08/21 0236 08/09/21 0500  WBC 14.6* 10.1 8.0  HGB 8.7* 8.1* 8.0*  HCT 28.3* 25.9* 24.9*  MCV 94.0 94.2 94.0  PLT 291 255 409   Basic Metabolic Panel: Recent Labs  Lab 08/02/21 1543 08/05/21 0922 08/08/21 0236 08/09/21 0500  NA  --  127* 121* 126*  K 3.1* 4.6 4.8 4.3  CL  --  88* 87* 95*  CO2  --  26 22 23   GLUCOSE  --  101* 107* 137*  BUN  --  72* 63* 36*  CREATININE  --  7.09* 6.62* 4.53*  CALCIUM  --  8.7* 8.3* 7.7*  PHOS  --  4.9* 5.6* 4.6   GFR: Estimated Creatinine Clearance: 18.1 mL/min (A) (by C-G formula based on SCr of 4.53 mg/dL (H)). Liver Function Tests: Recent Labs  Lab 08/05/21 0922 08/08/21 0236 08/09/21 0500  ALBUMIN 2.5* 2.2* 2.1*   No results for input(s): "LIPASE", "AMYLASE" in the last 168 hours. No results for input(s): "AMMONIA" in the last 168 hours.  Coagulation Profile: No results for input(s): "INR", "PROTIME" in the last 168 hours. Cardiac Enzymes: No results for input(s): "CKTOTAL", "CKMB", "CKMBINDEX", "TROPONINI" in the last 168 hours. BNP (last 3 results) No results for input(s): "PROBNP" in the last 8760 hours. HbA1C: No results for input(s): "HGBA1C" in the last 72 hours. CBG: Recent Labs  Lab  08/08/21 0748 08/08/21 1212 08/08/21 1927 08/09/21 0024 08/09/21 0419  GLUCAP 113* 118* 120* 129* 136*   Lipid Profile: No results for input(s): "CHOL", "HDL", "LDLCALC", "TRIG", "CHOLHDL", "LDLDIRECT" in the last 72 hours. Thyroid Function Tests: No results for input(s): "TSH", "T4TOTAL", "FREET4", "T3FREE", "THYROIDAB" in the last 72 hours. Anemia Panel: No results for input(s): "VITAMINB12", "FOLATE", "FERRITIN", "TIBC", "IRON", "RETICCTPCT" in the last 72 hours. Sepsis Labs: No results for input(s): "PROCALCITON", "LATICACIDVEN" in the last 168 hours.  Recent Results (from the past 240 hour(s))  Aerobic Culture w Gram Stain (superficial specimen)     Status: None   Collection Time: 07/30/21  6:26 PM   Specimen: Wound  Result Value Ref Range Status   Specimen Description   Final    WOUND Performed at [facility], [address] [place], [place] [address]    Special Requests   Final    THIGH Performed at [facility], [address] [place], [place] [address]    Gram Stain   Final    FEW WBC PRESENT, PREDOMINANTLY PMN FEW SQUAMOUS EPITHELIAL CELLS PRESENT NO ORGANISMS SEEN    Culture   Final    NO GROWTH 3 DAYS Performed at [facility], [place] [address] [place], [place] [address]    Report Status 08/02/2021 FINAL  Final  Expectorated Sputum Assessment w Gram Stain, Rflx to Resp Cult     Status: None   Collection Time: 07/31/21  4:18 PM   Specimen: Sputum  Result Value Ref Range Status   Specimen Description SPUTUM  Final   Special Requests NONE  Final   Sputum evaluation   Final    THIS SPECIMEN IS ACCEPTABLE FOR SPUTUM CULTURE Performed at [facility], [address] [place], [place] [address]    Report Status 07/31/2021 FINAL  Final  Culture, Respiratory w Gram Stain     Status: None   Collection Time: 07/31/21  4:18 PM   Specimen: SPU  Result Value Ref Range Status   Specimen Description SPUTUM  Final   Special  Requests NONE Reflexed from [id number]  Final   Gram Stain   Final    NO SQUAMOUS EPITHELIAL CELLS SEEN RARE WBC SEEN RARE GRAM POSITIVE RODS    Culture RARE CANDIDA ALBICANS  Final   Report Status 08/03/2021 FINAL  Final  Surgical pcr screen     Status: None   Collection Time: 08/08/21 11:57 PM   Specimen: Nasal Mucosa; Nasal Swab  Result Value Ref Range Status   MRSA, PCR NEGATIVE NEGATIVE Final   Staphylococcus aureus NEGATIVE NEGATIVE Final    Comment: (NOTE) The Xpert SA Assay (FDA approved for NASAL specimens in patients 4 years of age and older), is one component of a comprehensive surveillance program. It is not intended to diagnose infection nor to guide or monitor treatment. Performed at [facility], [place] [address], [place], [place] [address]          Radiology Studies: No results found.      Scheduled Meds:  acetaminophen  1,000 mg Oral Once   amitriptyline  25 mg Oral QHS  amLODipine  5 mg Oral Daily   atorvastatin  40 mg Oral q1800   carvedilol  6.25 mg Oral BID   chlorhexidine  60 mL Topical Once   Chlorhexidine Gluconate Cloth  6 each Topical Q0600   DULoxetine  60 mg Oral Daily   insulin aspart  0-6 Units Subcutaneous Q4H   metroNIDAZOLE  500 mg Oral Q12H   pantoprazole  40 mg Oral Daily   Continuous Infusions:  ceFEPime (MAXIPIME) IV       LOS: 2 days    Time spent: 40 minutes spent on chart review, discussion with nursing staff, consultants, updating family and interview/physical exam; more than 50% of that time was spent in counseling and/or coordination of care.    Cayleen Benjamin J [place] DO [facility] Available via Epic secure chat 7am-7pm After these hours, please refer to coverage provider listed on [email or web address] 08/09/2021, 10:06 AM

## 2021-08-09 NOTE — Anesthesia Preprocedure Evaluation (Signed)
Anesthesia Evaluation  Patient identified by MRN, date of birth, ID band Patient awake    Reviewed: Allergy & Precautions, NPO status , Patient's Chart, lab work & pertinent test results  Airway Mallampati: II  TM Distance: >3 FB Neck ROM: Full    Dental  (+) Poor Dentition   Pulmonary neg pulmonary ROS, Current Smoker and Patient abstained from smoking.,    Pulmonary exam normal        Cardiovascular hypertension, Pt. on medications and Pt. on home beta blockers  Rhythm:Regular Rate:Normal     Neuro/Psych  Headaches, Anxiety Depression CVA    GI/Hepatic Neg liver ROS, GERD  Medicated,  Endo/Other  diabetes, Type 2, Insulin Dependent  Renal/GU ESRF and DialysisRenal disease  negative genitourinary   Musculoskeletal  (+) Arthritis ,   Abdominal Normal abdominal exam  (+)   Peds  Hematology  (+) Blood dyscrasia, anemia ,   Anesthesia Other Findings   Reproductive/Obstetrics                            Anesthesia Physical Anesthesia Plan  ASA: 3  Anesthesia Plan: General   Post-op Pain Management:    Induction: Intravenous  PONV Risk Score and Plan: 2 and Ondansetron, Dexamethasone and Midazolam  Airway Management Planned: Mask and Oral ETT  Additional Equipment: None  Intra-op Plan:   Post-operative Plan: Extubation in OR  Informed Consent: I have reviewed the patients History and Physical, chart, labs and discussed the procedure including the risks, benefits and alternatives for the proposed anesthesia with the patient or authorized representative who has indicated his/her understanding and acceptance.     Dental advisory given  Plan Discussed with: CRNA  Anesthesia Plan Comments: (Lab Results      Component                Value               Date                      WBC                      8.6                 08/10/2021                HGB                      7.9 (L)              08/10/2021                HCT                      25.5 (L)            08/10/2021                MCV                      95.5                08/10/2021                PLT                      222  08/10/2021           Lab Results      Component                Value               Date                      NA                       130 (L)             08/10/2021                K                        4.4                 08/10/2021                CO2                      28                  08/10/2021                GLUCOSE                  122 (H)             08/10/2021                BUN                      24 (H)              08/10/2021                CREATININE               3.36 (H)            08/10/2021                CALCIUM                  8.0 (L)             08/10/2021                GFRNONAA                 16 (L)              08/10/2021          )       Anesthesia Quick Evaluation

## 2021-08-09 NOTE — Plan of Care (Signed)

## 2021-08-09 NOTE — Progress Notes (Signed)
Report given to Hemodialysis RN. ?

## 2021-08-09 NOTE — Progress Notes (Signed)
Patient ID: [name], female   DOB: 16-Aug-1965, 56 y.o.   MRN: [id number] [facility] Progress Note   Assessment/ Plan:   1.  Left hip infection with hardware involvement and recent left femoral condyle fracture: Plans noted by orthopedic surgery for incision and drainage/irrigation and hardware removal with implantation of antibiotic beads this afternoon.  On antimicrobial coverage with meropenem and metronidazole. 2.  End-stage renal disease: On hemodialysis at this time-to continue Monday/Wednesday/Friday schedule. 3.  Hypertension: We will undertake hemodialysis today with ultrafiltration and monitor current blood pressure trends. 4.  Anemia: Likely secondary to chronic illness however with significant inflammatory complex from ongoing infection likely leading to worsening hemoglobin/hematocrit.  On ESA and will monitor H/H trend for PRBC requirements. 5.  Chronic kidney disease-metabolic bone disease: Continue renal diet with phosphorus restriction.  Phosphorus level currently at goal with mildly elevated corrected calcium.  Subjective:   Complains of some intermittent left hip pain overnight with ongoing pain control.   Objective:   BP (!) 152/58   Pulse 64   Temp 98 F (36.7 C) (Oral)   Resp 12   Ht 5\' 10"  (1.778 m)   Wt 99.4 kg   SpO2 97%   BMI 31.44 kg/m   Physical Exam: Gen: Appears to be comfortable resting at dialysis CVS: Pulse regular rhythm, normal rate, S1 and S2 normal Resp: Clear to auscultation bilaterally, no rales/rhonchi Abd: Soft, obese, nontender, bowel sounds normal Ext: Status post left BKA with trace-1+ right lower extremity edema.  Left upper arm AVF cannulated  Labs: BMET Recent Labs  Lab 08/02/21 1543 08/05/21 0922 08/08/21 0236 08/09/21 0500  NA  --  127* 121* 126*  K 3.1* 4.6 4.8 4.3  CL  --  88* 87* 95*  CO2  --  26 22 23   GLUCOSE  --  101* 107* 137*  BUN  --  72* 63* 36*  CREATININE  --  7.09* 6.62* 4.53*  CALCIUM   --  8.7* 8.3* 7.7*  PHOS  --  4.9* 5.6* 4.6   CBC Recent Labs  Lab 08/05/21 0922 08/08/21 0236 08/09/21 0500  WBC 14.6* 10.1 8.0  HGB 8.7* 8.1* 8.0*  HCT 28.3* 25.9* 24.9*  MCV 94.0 94.2 94.0  PLT 291 255 212      Medications:     acetaminophen  1,000 mg Oral Once   amitriptyline  25 mg Oral QHS   amLODipine  5 mg Oral Daily   atorvastatin  40 mg Oral q1800   carvedilol  6.25 mg Oral BID   chlorhexidine  60 mL Topical Once   Chlorhexidine Gluconate Cloth  6 each Topical Q0600   DULoxetine  60 mg Oral Daily   insulin aspart  0-6 Units Subcutaneous Q4H   metroNIDAZOLE  500 mg Oral Q12H   pantoprazole  40 mg Oral Daily   Elmarie Shiley, MD 08/09/2021, 9:42 AM

## 2021-08-10 ENCOUNTER — Encounter (HOSPITAL_COMMUNITY)
Admission: AD | Disposition: A | Discharge status: Skilled Nursing Facility | Payer: Self-pay | Source: Other Acute Inpatient Hospital | Attending: Internal Medicine

## 2021-08-10 ENCOUNTER — Inpatient Hospital Stay (HOSPITAL_COMMUNITY): Payer: Medicare Other

## 2021-08-10 ENCOUNTER — Other Ambulatory Visit: Payer: Self-pay

## 2021-08-10 ENCOUNTER — Encounter (HOSPITAL_COMMUNITY): Payer: Self-pay | Admitting: Internal Medicine

## 2021-08-10 ENCOUNTER — Inpatient Hospital Stay (HOSPITAL_COMMUNITY): Payer: Medicare Other | Admitting: Anesthesiology

## 2021-08-10 DIAGNOSIS — M00852 Arthritis due to other bacteria, left hip: Secondary | ICD-10-CM

## 2021-08-10 DIAGNOSIS — M86252 Subacute osteomyelitis, left femur: Secondary | ICD-10-CM

## 2021-08-10 DIAGNOSIS — M00859 Arthritis due to other bacteria, unspecified hip: Secondary | ICD-10-CM | POA: Diagnosis not present

## 2021-08-10 DIAGNOSIS — M009 Pyogenic arthritis, unspecified: Secondary | ICD-10-CM | POA: Diagnosis not present

## 2021-08-10 DIAGNOSIS — E1122 Type 2 diabetes mellitus with diabetic chronic kidney disease: Secondary | ICD-10-CM | POA: Diagnosis not present

## 2021-08-10 DIAGNOSIS — T847XXA Infection and inflammatory reaction due to other internal orthopedic prosthetic devices, implants and grafts, initial encounter: Secondary | ICD-10-CM

## 2021-08-10 DIAGNOSIS — S72492A Other fracture of lower end of left femur, initial encounter for closed fracture: Secondary | ICD-10-CM | POA: Diagnosis not present

## 2021-08-10 DIAGNOSIS — I120 Hypertensive chronic kidney disease with stage 5 chronic kidney disease or end stage renal disease: Secondary | ICD-10-CM

## 2021-08-10 DIAGNOSIS — N186 End stage renal disease: Secondary | ICD-10-CM | POA: Diagnosis not present

## 2021-08-10 DIAGNOSIS — T847XXS Infection and inflammatory reaction due to other internal orthopedic prosthetic devices, implants and grafts, sequela: Secondary | ICD-10-CM | POA: Diagnosis not present

## 2021-08-10 DIAGNOSIS — I10 Essential (primary) hypertension: Secondary | ICD-10-CM | POA: Diagnosis not present

## 2021-08-10 DIAGNOSIS — Z992 Dependence on renal dialysis: Secondary | ICD-10-CM | POA: Diagnosis not present

## 2021-08-10 DIAGNOSIS — Z794 Long term (current) use of insulin: Secondary | ICD-10-CM

## 2021-08-10 DIAGNOSIS — T8452XA Infection and inflammatory reaction due to internal left hip prosthesis, initial encounter: Secondary | ICD-10-CM | POA: Diagnosis not present

## 2021-08-10 HISTORY — PX: HARDWARE REMOVAL: SHX979

## 2021-08-10 LAB — CBC
HCT: 25.5 % — ABNORMAL LOW (ref 36.0–46.0)
HCT: 22.1 % — ABNORMAL LOW (ref 36.0–46.0)
Hemoglobin: 7.9 g/dL — ABNORMAL LOW (ref 12.0–15.0)
Hemoglobin: 7 g/dL — ABNORMAL LOW (ref 12.0–15.0)
MCH: 29.6 pg (ref 26.0–34.0)
MCH: 29.9 pg (ref 26.0–34.0)
MCHC: 31 g/dL (ref 30.0–36.0)
MCHC: 31.7 g/dL (ref 30.0–36.0)
MCV: 95.5 fL (ref 80.0–100.0)
MCV: 94.4 fL (ref 80.0–100.0)
Platelets: 222 10*3/uL (ref 150–400)
Platelets: 265 10*3/uL (ref 150–400)
RBC: 2.67 MIL/uL — ABNORMAL LOW (ref 3.87–5.11)
RBC: 2.34 MIL/uL — ABNORMAL LOW (ref 3.87–5.11)
RDW: 15.5 % (ref 11.5–15.5)
RDW: 15.2 % (ref 11.5–15.5)
WBC: 8.6 10*3/uL (ref 4.0–10.5)
WBC: 14.6 10*3/uL — ABNORMAL HIGH (ref 4.0–10.5)
nRBC: 0 % (ref 0.0–0.2)
nRBC: 0 % (ref 0.0–0.2)

## 2021-08-10 LAB — RENAL FUNCTION PANEL
Albumin: 2.1 g/dL — ABNORMAL LOW (ref 3.5–5.0)
Albumin: 2 g/dL — ABNORMAL LOW (ref 3.5–5.0)
Anion gap: 9 (ref 5–15)
Anion gap: 11 (ref 5–15)
BUN: 24 mg/dL — ABNORMAL HIGH (ref 6–20)
BUN: 31 mg/dL — ABNORMAL HIGH (ref 6–20)
CO2: 28 mmol/L (ref 22–32)
CO2: 25 mmol/L (ref 22–32)
Calcium: 8 mg/dL — ABNORMAL LOW (ref 8.9–10.3)
Calcium: 8 mg/dL — ABNORMAL LOW (ref 8.9–10.3)
Chloride: 93 mmol/L — ABNORMAL LOW (ref 98–111)
Chloride: 90 mmol/L — ABNORMAL LOW (ref 98–111)
Creatinine, Ser: 3.36 mg/dL — ABNORMAL HIGH (ref 0.44–1.00)
Creatinine, Ser: 3.98 mg/dL — ABNORMAL HIGH (ref 0.44–1.00)
GFR, Estimated: 16 mL/min — ABNORMAL LOW (ref >60)
GFR, Estimated: 13 mL/min — ABNORMAL LOW (ref >60)
Glucose, Bld: 122 mg/dL — ABNORMAL HIGH (ref 70–99)
Glucose, Bld: 261 mg/dL — ABNORMAL HIGH (ref 70–99)
Phosphorus: 3.9 mg/dL (ref 2.5–4.6)
Phosphorus: 5.4 mg/dL — ABNORMAL HIGH (ref 2.5–4.6)
Potassium: 4.4 mmol/L (ref 3.5–5.1)
Potassium: 4.7 mmol/L (ref 3.5–5.1)
Sodium: 130 mmol/L — ABNORMAL LOW (ref 135–145)
Sodium: 126 mmol/L — ABNORMAL LOW (ref 135–145)

## 2021-08-10 LAB — GLUCOSE, CAPILLARY
Glucose-Capillary: 124 mg/dL — ABNORMAL HIGH (ref 70–99)
Glucose-Capillary: 121 mg/dL — ABNORMAL HIGH (ref 70–99)
Glucose-Capillary: 146 mg/dL — ABNORMAL HIGH (ref 70–99)
Glucose-Capillary: 181 mg/dL — ABNORMAL HIGH (ref 70–99)
Glucose-Capillary: 256 mg/dL — ABNORMAL HIGH (ref 70–99)
Glucose-Capillary: 238 mg/dL — ABNORMAL HIGH (ref 70–99)

## 2021-08-10 SURGERY — REMOVAL, HARDWARE
Anesthesia: General | Site: Hip | Laterality: Left

## 2021-08-10 MED ORDER — CHLORHEXIDINE GLUCONATE 0.12 % MT SOLN
OROMUCOSAL | Status: AC
  Administered 2021-08-10: 15 mL
  Filled 2021-08-10: qty 15

## 2021-08-10 MED ORDER — OXYCODONE HCL 5 MG PO TABS
10.0000 mg | ORAL_TABLET | ORAL | Status: DC | PRN
  Administered 2021-08-10 – 2021-08-14 (×11): 15 mg via ORAL
  Administered 2021-08-14: 10 mg via ORAL
  Administered 2021-08-15 – 2021-08-17 (×6): 15 mg via ORAL
  Administered 2021-08-18: 10 mg via ORAL
  Administered 2021-08-18 – 2021-08-19 (×3): 15 mg via ORAL
  Administered 2021-08-19: 10 mg via ORAL
  Administered 2021-08-19 – 2021-08-23 (×9): 15 mg via ORAL
  Filled 2021-08-10: qty 2
  Filled 2021-08-10 (×16): qty 3
  Filled 2021-08-10: qty 2
  Filled 2021-08-10 (×13): qty 3

## 2021-08-10 MED ORDER — FENTANYL CITRATE (PF) 100 MCG/2ML IJ SOLN
INTRAMUSCULAR | Status: AC
  Filled 2021-08-10: qty 2

## 2021-08-10 MED ORDER — FENTANYL CITRATE (PF) 250 MCG/5ML IJ SOLN
INTRAMUSCULAR | Status: AC
  Filled 2021-08-10: qty 5

## 2021-08-10 MED ORDER — MIDAZOLAM HCL 2 MG/2ML IJ SOLN
INTRAMUSCULAR | Status: DC | PRN
  Administered 2021-08-10: 2 mg via INTRAVENOUS

## 2021-08-10 MED ORDER — CEFAZOLIN SODIUM-DEXTROSE 2-4 GM/100ML-% IV SOLN
INTRAVENOUS | Status: AC
  Filled 2021-08-10: qty 100

## 2021-08-10 MED ORDER — DEXAMETHASONE SODIUM PHOSPHATE 10 MG/ML IJ SOLN
INTRAMUSCULAR | Status: DC | PRN
  Administered 2021-08-10: 5 mg via INTRAVENOUS

## 2021-08-10 MED ORDER — PENTAFLUOROPROP-TETRAFLUOROETH EX AERO: 1.0000 "application " | INHALATION_SPRAY | CUTANEOUS | Status: DC | PRN

## 2021-08-10 MED ORDER — 0.9 % SODIUM CHLORIDE (POUR BTL) OPTIME
TOPICAL | Status: DC | PRN
  Administered 2021-08-10: 1000 mL

## 2021-08-10 MED ORDER — PROPOFOL 10 MG/ML IV BOLUS
INTRAVENOUS | Status: DC | PRN
  Administered 2021-08-10: 140 mg via INTRAVENOUS

## 2021-08-10 MED ORDER — METHOCARBAMOL 1000 MG/10ML IJ SOLN: 500.0000 mg | Freq: Four times a day (QID) | INTRAVENOUS | Status: DC | PRN

## 2021-08-10 MED ORDER — FENTANYL CITRATE (PF) 250 MCG/5ML IJ SOLN
INTRAMUSCULAR | Status: DC | PRN
Start: 2021-08-10 — End: 2021-08-10
  Administered 2021-08-10 (×3): 50 ug via INTRAVENOUS
  Administered 2021-08-10: 100 ug via INTRAVENOUS

## 2021-08-10 MED ORDER — PROPOFOL 10 MG/ML IV BOLUS
INTRAVENOUS | Status: AC
  Filled 2021-08-10: qty 20

## 2021-08-10 MED ORDER — DEXAMETHASONE SODIUM PHOSPHATE 10 MG/ML IJ SOLN
INTRAMUSCULAR | Status: AC
  Filled 2021-08-10: qty 1

## 2021-08-10 MED ORDER — LIDOCAINE 2% (20 MG/ML) 5 ML SYRINGE
INTRAMUSCULAR | Status: AC
  Filled 2021-08-10: qty 5

## 2021-08-10 MED ORDER — METHOCARBAMOL 500 MG PO TABS
500.0000 mg | ORAL_TABLET | Freq: Four times a day (QID) | ORAL | Status: DC | PRN
  Administered 2021-08-10 – 2021-08-23 (×14): 500 mg via ORAL
  Filled 2021-08-10 (×14): qty 1

## 2021-08-10 MED ORDER — METOCLOPRAMIDE HCL 5 MG/ML IJ SOLN: 5.0000 mg | Freq: Three times a day (TID) | INTRAMUSCULAR | Status: DC | PRN

## 2021-08-10 MED ORDER — ROCURONIUM BROMIDE 10 MG/ML (PF) SYRINGE
PREFILLED_SYRINGE | INTRAVENOUS | Status: AC
  Filled 2021-08-10: qty 10

## 2021-08-10 MED ORDER — FENTANYL CITRATE (PF) 100 MCG/2ML IJ SOLN
25.0000 ug | INTRAMUSCULAR | Status: DC | PRN
  Administered 2021-08-10: 50 ug via INTRAVENOUS

## 2021-08-10 MED ORDER — BISACODYL 10 MG RE SUPP: 10.0000 mg | Freq: Every day | RECTAL | Status: DC | PRN

## 2021-08-10 MED ORDER — MIDAZOLAM HCL 2 MG/2ML IJ SOLN
INTRAMUSCULAR | Status: AC
Start: 2021-08-10 — End: ?
  Filled 2021-08-10: qty 2

## 2021-08-10 MED ORDER — OXYCODONE HCL 5 MG PO TABS
5.0000 mg | ORAL_TABLET | ORAL | Status: DC | PRN
  Administered 2021-08-10 – 2021-08-16 (×4): 5 mg via ORAL
  Administered 2021-08-17 – 2021-08-22 (×6): 10 mg via ORAL
  Filled 2021-08-10 (×2): qty 2
  Filled 2021-08-10: qty 1
  Filled 2021-08-10 (×7): qty 2

## 2021-08-10 MED ORDER — CEFAZOLIN SODIUM-DEXTROSE 2-3 GM-%(50ML) IV SOLR
INTRAVENOUS | Status: DC | PRN
  Administered 2021-08-10: 2 g via INTRAVENOUS

## 2021-08-10 MED ORDER — INSULIN ASPART 100 UNIT/ML IJ SOLN: 0.0000 [IU] | INTRAMUSCULAR | Status: DC | PRN

## 2021-08-10 MED ORDER — ONDANSETRON HCL 4 MG/2ML IJ SOLN
INTRAMUSCULAR | Status: AC
Start: 2021-08-10 — End: ?
  Filled 2021-08-10: qty 2

## 2021-08-10 MED ORDER — ROCURONIUM BROMIDE 10 MG/ML (PF) SYRINGE
PREFILLED_SYRINGE | INTRAVENOUS | Status: DC | PRN
  Administered 2021-08-10: 50 mg via INTRAVENOUS

## 2021-08-10 MED ORDER — ONDANSETRON HCL 4 MG PO TABS
4.0000 mg | ORAL_TABLET | Freq: Four times a day (QID) | ORAL | Status: DC | PRN
  Administered 2021-08-20: 4 mg via ORAL
  Filled 2021-08-10: qty 1

## 2021-08-10 MED ORDER — ONDANSETRON HCL 4 MG/2ML IJ SOLN
INTRAMUSCULAR | Status: DC | PRN
  Administered 2021-08-10: 4 mg via INTRAVENOUS

## 2021-08-10 MED ORDER — HYDROMORPHONE HCL 1 MG/ML IJ SOLN
0.5000 mg | INTRAMUSCULAR | Status: AC | PRN
  Administered 2021-08-10 – 2021-08-18 (×6): 1 mg via INTRAVENOUS
  Filled 2021-08-10 (×7): qty 1

## 2021-08-10 MED ORDER — DOCUSATE SODIUM 100 MG PO CAPS
100.0000 mg | ORAL_CAPSULE | Freq: Two times a day (BID) | ORAL | Status: DC
  Administered 2021-08-10 – 2021-08-22 (×13): 100 mg via ORAL
  Filled 2021-08-10 (×25): qty 1

## 2021-08-10 MED ORDER — SODIUM CHLORIDE 0.9 % IV SOLN: INTRAVENOUS | Status: DC

## 2021-08-10 MED ORDER — POLYETHYLENE GLYCOL 3350 17 G PO PACK: 17.0000 g | PACK | Freq: Every day | ORAL | Status: DC | PRN

## 2021-08-10 MED ORDER — METOCLOPRAMIDE HCL 5 MG PO TABS
5.0000 mg | ORAL_TABLET | Freq: Three times a day (TID) | ORAL | Status: DC | PRN
  Administered 2021-08-20: 10 mg via ORAL
  Filled 2021-08-10: qty 2

## 2021-08-10 MED ORDER — SUGAMMADEX SODIUM 200 MG/2ML IV SOLN
INTRAVENOUS | Status: DC | PRN
  Administered 2021-08-10: 200 mg via INTRAVENOUS

## 2021-08-10 MED ORDER — ONDANSETRON HCL 4 MG/2ML IJ SOLN
4.0000 mg | Freq: Four times a day (QID) | INTRAMUSCULAR | Status: DC | PRN
  Administered 2021-08-12: 4 mg via INTRAVENOUS
  Filled 2021-08-10: qty 2

## 2021-08-10 SURGICAL SUPPLY — 50 items
BAG COUNTER SPONGE SURGICOUNT (BAG) ×2 IMPLANT
BAG SPNG CNTER NS LX DISP (BAG) ×1
BANDAGE ESMARK 6X9 LF (GAUZE/BANDAGES/DRESSINGS) IMPLANT
BNDG CMPR 9X6 STRL LF SNTH (GAUZE/BANDAGES/DRESSINGS)
BNDG COHESIVE 4X5 TAN STRL (GAUZE/BANDAGES/DRESSINGS) IMPLANT
BNDG ESMARK 6X9 LF (GAUZE/BANDAGES/DRESSINGS)
BNDG GAUZE ELAST 4 BULKY (GAUZE/BANDAGES/DRESSINGS) ×1 IMPLANT
CNTNR URN SCR LID CUP LEK RST (MISCELLANEOUS) IMPLANT
CONT SPEC 4OZ STRL OR WHT (MISCELLANEOUS) ×2
COVER SURGICAL LIGHT HANDLE (MISCELLANEOUS) ×3 IMPLANT
CUFF TOURN SGL QUICK 34 (TOURNIQUET CUFF)
CUFF TOURN SGL QUICK 42 (TOURNIQUET CUFF) IMPLANT
CUFF TRNQT CYL 34X4.125X (TOURNIQUET CUFF) IMPLANT
DRAPE C-ARM 42X72 X-RAY (DRAPES) IMPLANT
DRAPE INCISE IOBAN 66X45 STRL (DRAPES) IMPLANT
DRAPE ORTHO SPLIT 77X108 STRL (DRAPES) ×4
DRAPE STERI IOBAN 125X83 (DRAPES) ×1 IMPLANT
DRAPE SURG ORHT 6 SPLT 77X108 (DRAPES) IMPLANT
DRAPE U-SHAPE 47X51 STRL (DRAPES) ×1 IMPLANT
DRSG EMULSION OIL 3X3 NADH (GAUZE/BANDAGES/DRESSINGS) ×1 IMPLANT
DRSG MEPILEX BORDER 4X12 (GAUZE/BANDAGES/DRESSINGS) ×2 IMPLANT
DRSG PAD ABDOMINAL 8X10 ST (GAUZE/BANDAGES/DRESSINGS) ×2 IMPLANT
DURAPREP 26ML APPLICATOR (WOUND CARE) ×2 IMPLANT
ELECT REM PT RETURN 9FT ADLT (ELECTROSURGICAL) ×2
ELECTRODE REM PT RTRN 9FT ADLT (ELECTROSURGICAL) ×1 IMPLANT
GAUZE SPONGE 4X4 12PLY STRL (GAUZE/BANDAGES/DRESSINGS) ×2 IMPLANT
GLOVE BIOGEL PI IND STRL 9 (GLOVE) ×1 IMPLANT
GLOVE BIOGEL PI INDICATOR 9 (GLOVE) ×1
GLOVE SURG ORTHO 9.0 STRL STRW (GLOVE) ×2 IMPLANT
GOWN STRL REUS W/ TWL XL LVL3 (GOWN DISPOSABLE) ×3 IMPLANT
GOWN STRL REUS W/TWL XL LVL3 (GOWN DISPOSABLE) ×6
KIT BASIN OR (CUSTOM PROCEDURE TRAY) ×2 IMPLANT
KIT TURNOVER KIT B (KITS) ×2 IMPLANT
MANIFOLD NEPTUNE II (INSTRUMENTS) ×2 IMPLANT
NS IRRIG 1000ML POUR BTL (IV SOLUTION) ×2 IMPLANT
PACK GENERAL/GYN (CUSTOM PROCEDURE TRAY) ×1 IMPLANT
PACK ORTHO EXTREMITY (CUSTOM PROCEDURE TRAY) ×1 IMPLANT
PAD ARMBOARD 7.5X6 YLW CONV (MISCELLANEOUS) ×4 IMPLANT
SPONGE T-LAP 18X18 ~~LOC~~+RFID (SPONGE) ×1 IMPLANT
STAPLER VISISTAT 35W (STAPLE) ×1 IMPLANT
STOCKINETTE IMPERVIOUS 9X36 MD (GAUZE/BANDAGES/DRESSINGS) IMPLANT
SUT ETHILON 2 0 PSLX (SUTURE) ×6 IMPLANT
SUT VIC AB 0 CT1 27 (SUTURE)
SUT VIC AB 0 CT1 27XBRD ANBCTR (SUTURE) IMPLANT
SUT VIC AB 2-0 CT1 27 (SUTURE)
SUT VIC AB 2-0 CT1 TAPERPNT 27 (SUTURE) IMPLANT
TOWEL GREEN STERILE (TOWEL DISPOSABLE) ×2 IMPLANT
TOWEL GREEN STERILE FF (TOWEL DISPOSABLE) ×2 IMPLANT
UNDERPAD 30X36 HEAVY ABSORB (UNDERPADS AND DIAPERS) ×2 IMPLANT
WATER STERILE IRR 1000ML POUR (IV SOLUTION) ×2 IMPLANT

## 2021-08-10 NOTE — Plan of Care (Signed)

## 2021-08-10 NOTE — Progress Notes (Addendum)
[facility] Progress Note   Subjective:    Seen and examined patient at bedside. Patient's daughter also at bedside. S/p L hip nail removal, partial resection of L femur for osteo, and resection infected soft tissue by Dr. [name]. She reports tolerating the procedure well. C/o generalized pain. Currently denies SOB, CP, and N/V. Tolerated yesterday's HD with net UF 2.5L.   Objective Vitals:   08/10/21 0922 08/10/21 0936 08/10/21 0951 08/10/21 1030  BP: (!) 144/68 (!) 116/56 (!) 121/59 (!) 128/54  Pulse: 63 68 69 68  Resp: 16 17 11 18   Temp: 98.8 F (37.1 C)  (!) 97.3 F (36.3 C) 98 F (36.7 C)  TempSrc:    Oral  SpO2: 99% 96% 97% 96%  Weight:      Height:       Physical Exam General: Awake and alert; NAD Heart: S1 and S2; No murmurs, gallops, or rubs Lungs: Clear anteriorly and laterally Abdomen: Soft and non-tender Extremities: S/p L BKA; trace edema RLE Dialysis Access: L AVF (+) B/T   Filed Weights   08/07/21 2020 08/08/21 1454 08/09/21 0800  Weight: 103 kg 104.3 kg 99.4 kg    Intake/Output Summary (Last 24 hours) at 08/10/2021 1157 Last data filed at 08/10/2021 0902 Gross per 24 hour  Intake 450 ml  Output 300 ml  Net 150 ml    Additional Objective Labs: Basic Metabolic Panel: Recent Labs  Lab 08/08/21 0236 08/09/21 0500 08/10/21 0214  NA 121* 126* 130*  K 4.8 4.3 4.4  CL 87* 95* 93*  CO2 22 23 28   GLUCOSE 107* 137* 122*  BUN 63* 36* 24*  CREATININE 6.62* 4.53* 3.36*  CALCIUM 8.3* 7.7* 8.0*  PHOS 5.6* 4.6 3.9   Liver Function Tests: Recent Labs  Lab 08/08/21 0236 08/09/21 0500 08/10/21 0214  ALBUMIN 2.2* 2.1* 2.1*   No results for input(s): "LIPASE", "AMYLASE" in the last 168 hours. CBC: Recent Labs  Lab 08/05/21 0922 08/08/21 0236 08/09/21 0500 08/10/21 0214  WBC 14.6* 10.1 8.0 8.6  HGB 8.7* 8.1* 8.0* 7.9*  HCT 28.3* 25.9* 24.9* 25.5*  MCV 94.0 94.2 94.0 95.5  PLT 291 255 212 222   Blood Culture    Component Value  Date/Time   SDES SPUTUM 07/31/2021 1618   SDES SPUTUM 07/31/2021 1618   SPECREQUEST NONE 07/31/2021 1618   SPECREQUEST NONE Reflexed from [id number] 07/31/2021 [place] ALBICANS 07/31/2021 1618   REPTSTATUS 07/31/2021 FINAL 07/31/2021 1618   REPTSTATUS 08/03/2021 FINAL 07/31/2021 1618    Cardiac Enzymes: No results for input(s): "CKTOTAL", "CKMB", "CKMBINDEX", "TROPONINI" in the last 168 hours. CBG: Recent Labs  Lab 08/09/21 2343 08/10/21 0401 08/10/21 0700 08/10/21 0922 08/10/21 1151  GLUCAP 139* 124* 121* 146* 181*   Iron Studies: No results for input(s): "IRON", "TIBC", "TRANSFERRIN", "FERRITIN" in the last 72 hours. Lab Results  Component Value Date   INR 1.1 12/28/2019   INR 1.2 12/17/2018   INR 0.99 04/07/2017   Studies/Results: No results found.  Medications:  ceFAZolin     ceFEPime (MAXIPIME) IV 2 g (08/09/21 1711)   methocarbamol (ROBAXIN) IV      amitriptyline  25 mg Oral QHS   amLODipine  5 mg Oral Daily   atorvastatin  40 mg Oral q1800   carvedilol  6.25 mg Oral BID   Chlorhexidine Gluconate Cloth  6 each Topical Q0600   docusate sodium  100 mg Oral BID   DULoxetine  60 mg Oral Daily  fentaNYL       insulin aspart  0-6 Units Subcutaneous Q4H   metroNIDAZOLE  500 mg Oral Q12H   pantoprazole  40 mg Oral Daily    Assessment/Plan: 1.  Left hip infection with hardware involvement and recent left femoral condyle fracture: S/p L hip nail removal, partial resection of L femur for osteo, and resection of infected soft tissue-sent for cultures-performed by Dr [name] today. On antimicrobial coverage with meropenem, metronidazole, and cefepime. 2.  End-stage renal disease: On hemodialysis at this time-to continue Monday/Wednesday/Friday schedule. 3.  Hypertension: Volume status currently stable and Bps acceptable. Continue UF with HD. Monitor current blood pressure trend. 4.  Anemia: Hgb now 7.9. Likely secondary to chronic illness however with  significant inflammatory complex from ongoing infection likely leading to worsening hemoglobin/hematocrit. ESA received on 6/8. Will monitor H/H trend for PRBC requirements. 5.  Chronic kidney disease-metabolic bone disease: Continue renal diet with phosphorus restriction.  Phosphorus level currently at goal with mildly elevated corrected calcium.  Tobie Poet, NP [name] 08/10/2021,11:57 AM  LOS: 3 days

## 2021-08-10 NOTE — Progress Notes (Signed)
PROGRESS NOTE    SHAMARIE CALL  XBM:[id number] DOB: Apr 13, 1965 DOA: 08/07/2021 PCP: Julian Hy, PA-C    Brief Narrative:   [name] is a 56 y.o. female with past medical history significant for HTN, HLD, ESRD on HD MWF, history of left BKA 2021, depression/anxiety, history of CVA, tobacco use disorder, type 2 diabetes mellitus, insulin-dependent, insomnia, chronic pain syndrome who presented to [facility] on 6/7 via transfer from [facility] for distal left femur fracture secondary to mechanical fall, deemed nonoperative per [facility] orthopedics.  During this hospital course, patient was treated for multifocal pneumonia Ona, started on antibiotics on 5/27 in which she finished a course on 6/7.  Urine culture, sputum culture, blood cultures all remain negative.  Underwent drainage of superficial abscess left hip on 5/28, culture negative.  MRI left hip concerning for 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  Orthopedics initially recommended I&D, hardware removal and placement of antibiotic beads but patient declined surgery.  Was seen by infectious disease during her hospitalization who recommended I&D but orthopedics felt infection would recur and she would need a very extensive debridement.  IR was also consulted but patient declined CT-guided drainage procedure requested surgical opinion from her primary orthopedist, Dr. Lyla Glassing who accepted the patient for transfer to [facility] under the hospital service.  Patient was initially treated with cefepime but changed to meropenem due to prior history of resistant Enterobacter and Bacteroides.  Assessment & Plan:   Left hip septic arthritis/osteomyelitis, abscess, hardware involvement Patient presenting as a transfer from [facility] after imaging studies notable for ununited left hip fracture with extensive lucency around hip hardware consistent with extensive  osteomyelitis, 6 cm posterior fluid collection concerning for abscess that appears with a sinus track to skin with probable septic arthritis, mild fasciitis, cellulitis.  Orthopedics at [facility] initially recommended I&D with hardware removal and placement of antibiotic beads but patient declined surgery.  IR was also consulted patient declined CT-guided drainage of the procedure requested surgical opinion from her primary orthopedist Dr. Lyla Glassing.  Patient was initially treated with cefepime but changed to meropenem by infectious disease on 5/30 due to prior history of resistant Enterobacter and Bacteroides. --Orthopedic/infectious disease following, appreciate assistance --Blood Cultures 5/27 x 2: No growth x 5 days --Superficial wound culture 5/30: no organism seen on Gram stain, no growth x3 days --Cefepime 2 g IV every Monday/Wednesday/Friday --Metronidazole 500 mg PO q12h --Orthopedics plans I&D, hardware removal, antibiotic bead placement on today, n.p.o.  Left femoral condyle fracture Evaluated by orthopedics, currently plan nonoperative management. --Nonweightbearing left lower extremity --Pain control  Skin abscess left hip Previously I&D with copious purulent fluid obtained, wound culture negative. --Continue local wound care  Hyponatremia Sodium 121 on admission 6/7, has missed several HD sessions prior, did not receive dialysis yesterday.  Likely secondary to hypervolemic hyponatremia. --Na 121>126>130  Type 2 diabetes mellitus, insulin-dependent Hemoglobin A1c 7.1 on 07/22/2021, well controlled.   --Hold long-acting insulin for now --very Sensitive SSI for coverage --CBGs qAC/HS  ESRD on HD MWF --Nephrology consulted for continued HD while inpatient  Depression/Anxiety --Amitriptyline 25 mg p.o. nightly --Duloxetine 60 mg p.o. daily  Essential hypertension --Amlodipine 5 mg p.o. daily --Carvedilol 6.25 mg p.o. twice daily --Holding home hydralazine --Continue to monitor  BP closely  Obesity Body mass index is 31.44 kg/m.  Discussed with patient needs for aggressive lifestyle changes/weight loss as this complicates all facets of care.  Outpatient follow-up  with PCP.      DVT prophylaxis: SCDs Start: 08/07/21 2226    Code Status: Full Code Family Communication:   Disposition Plan:  Level of care: Med-Surg Status is: Inpatient Remains inpatient appropriate because: Pending surgical invention for septic arthritis, osteomyelitis, infected hardware, abscess left hip today, will likely need long-term antibiotics    Consultants:  Orthopedics, Dr. [name] Nephrology Infectious disease  Procedures:  None  Antimicrobials:  Azithromycin 5/21 - 5/30 Cefepime 5/21 - 5/28, 6/9>> Meropenem 5/30 - 6/8 Metronidazole 6/9>>   Subjective: Patient seen examined bedside, resting comfortably.  Reports poor sleep overnight.  Pending surgical intervention today.  No other specific questions or concerns at this time.  Denies headache, no fever/chills/night sweats, no nausea/vomiting/diarrhea, no chest pain, no palpitations, no shortness of breath, no abdominal pain, no focal weakness, no fatigue, no paresthesias.  No acute events overnight per nursing staff.  Objective: Vitals:   08/09/21 1130 08/09/21 1240 08/09/21 1607 08/09/21 2100  BP: (!) 157/64 (!) 145/56 (!) 157/49 138/63  Pulse: 64 (!) 59 70 64  Resp: 12 13 15 16   Temp:  98.8 F (37.1 C) 98 F (36.7 C) 98.5 F (36.9 C)  TempSrc:    Oral  SpO2: 99% 100% 98% 97%  Weight:      Height:        Intake/Output Summary (Last 24 hours) at 08/10/2021 8182 Last data filed at 08/09/2021 1800 Gross per 24 hour  Intake 100 ml  Output --  Net 100 ml   Filed Weights   08/07/21 2020 08/08/21 1454 08/09/21 0800  Weight: 103 kg 104.3 kg 99.4 kg    Examination:  Physical Exam: GEN: NAD, alert and oriented x 3, chronically ill in appearance, appears older than stated age, obese HEENT: NCAT, PERRL, EOMI, sclera  clear, MMM PULM: CTAB w/o wheezes/crackles, normal respiratory effort, on room air CV: RRR w/o M/G/R GI: abd soft, NTND, NABS, no R/G/M MSK: Left BKA noted, left thigh with dressing in place with purulent discharge, moves all extremities independently with preserved muscle strength NEURO: CN II-XII intact, no focal deficits, sensation to light touch intact PSYCH: normal mood/affect Integumentary: Left thigh wound as above, otherwise no other concerning rashes/lesions/wounds     Data Reviewed: I have personally reviewed following labs and imaging studies  CBC: Recent Labs  Lab 08/05/21 0922 08/08/21 0236 08/09/21 0500 08/10/21 0214  WBC 14.6* 10.1 8.0 8.6  HGB 8.7* 8.1* 8.0* 7.9*  HCT 28.3* 25.9* 24.9* 25.5*  MCV 94.0 94.2 94.0 95.5  PLT 291 255 212 993   Basic Metabolic Panel: Recent Labs  Lab 08/05/21 0922 08/08/21 0236 08/09/21 0500 08/10/21 0214  NA 127* 121* 126* 130*  K 4.6 4.8 4.3 4.4  CL 88* 87* 95* 93*  CO2 26 22 23 28   GLUCOSE 101* 107* 137* 122*  BUN 72* 63* 36* 24*  CREATININE 7.09* 6.62* 4.53* 3.36*  CALCIUM 8.7* 8.3* 7.7* 8.0*  PHOS 4.9* 5.6* 4.6 3.9   GFR: Estimated Creatinine Clearance: 24.4 mL/min (A) (by C-G formula based on SCr of 3.36 mg/dL (H)). Liver Function Tests: Recent Labs  Lab 08/05/21 0922 08/08/21 0236 08/09/21 0500 08/10/21 0214  ALBUMIN 2.5* 2.2* 2.1* 2.1*   No results for input(s): "LIPASE", "AMYLASE" in the last 168 hours. No results for input(s): "AMMONIA" in the last 168 hours. Coagulation Profile: No results for input(s): "INR", "PROTIME" in the last 168 hours. Cardiac Enzymes: No results for input(s): "CKTOTAL", "CKMB", "CKMBINDEX", "TROPONINI" in the last 168 hours.  BNP (last 3 results) No results for input(s): "PROBNP" in the last 8760 hours. HbA1C: No results for input(s): "HGBA1C" in the last 72 hours. CBG: Recent Labs  Lab 08/09/21 1337 08/09/21 1606 08/09/21 2050 08/09/21 2343 08/10/21 0401  GLUCAP 103*  215* 199* 139* 124*   Lipid Profile: No results for input(s): "CHOL", "HDL", "LDLCALC", "TRIG", "CHOLHDL", "LDLDIRECT" in the last 72 hours. Thyroid Function Tests: No results for input(s): "TSH", "T4TOTAL", "FREET4", "T3FREE", "THYROIDAB" in the last 72 hours. Anemia Panel: No results for input(s): "VITAMINB12", "FOLATE", "FERRITIN", "TIBC", "IRON", "RETICCTPCT" in the last 72 hours. Sepsis Labs: No results for input(s): "PROCALCITON", "LATICACIDVEN" in the last 168 hours.  Recent Results (from the past 240 hour(s))  Expectorated Sputum Assessment w Gram Stain, Rflx to Resp Cult     Status: None   Collection Time: 07/31/21  4:18 PM   Specimen: Sputum  Result Value Ref Range Status   Specimen Description SPUTUM  Final   Special Requests NONE  Final   Sputum evaluation   Final    THIS SPECIMEN IS ACCEPTABLE FOR SPUTUM CULTURE Performed at [facility], [address] [place], [place] [address]    Report Status 07/31/2021 FINAL  Final  Culture, Respiratory w Gram Stain     Status: None   Collection Time: 07/31/21  4:18 PM   Specimen: SPU  Result Value Ref Range Status   Specimen Description SPUTUM  Final   Special Requests NONE Reflexed from [id number]  Final   Gram Stain   Final    NO SQUAMOUS EPITHELIAL CELLS SEEN RARE WBC SEEN RARE GRAM POSITIVE RODS    Culture RARE CANDIDA ALBICANS  Final   Report Status 08/03/2021 FINAL  Final  Surgical pcr screen     Status: None   Collection Time: 08/08/21 11:57 PM   Specimen: Nasal Mucosa; Nasal Swab  Result Value Ref Range Status   MRSA, PCR NEGATIVE NEGATIVE Final   Staphylococcus aureus NEGATIVE NEGATIVE Final    Comment: (NOTE) The Xpert SA Assay (FDA approved for NASAL specimens in patients 31 years of age and older), is one component of a comprehensive surveillance program. It is not intended to diagnose infection nor to guide or monitor treatment. Performed at [facility], [place] [address], [place],  [place] [address]          Radiology Studies: No results found.      Scheduled Meds:  amitriptyline  25 mg Oral QHS   amLODipine  5 mg Oral Daily   atorvastatin  40 mg Oral q1800   carvedilol  6.25 mg Oral BID   chlorhexidine  60 mL Topical Once   Chlorhexidine Gluconate Cloth  6 each Topical Q0600   DULoxetine  60 mg Oral Daily   insulin aspart  0-6 Units Subcutaneous Q4H   metroNIDAZOLE  500 mg Oral Q12H   pantoprazole  40 mg Oral Daily   Continuous Infusions:  ceFEPime (MAXIPIME) IV 2 g (08/09/21 1711)     LOS: 3 days    Time spent: 40 minutes spent on chart review, discussion with nursing staff, consultants, updating family and interview/physical exam; more than 50% of that time was spent in counseling and/or coordination of care.    Samik Balkcom J [place] DO [facility] Available via Epic secure chat 7am-7pm After these hours, please refer to coverage provider listed on [email or web address] 08/10/2021, 6:33 AM

## 2021-08-10 NOTE — Anesthesia Procedure Notes (Signed)
Procedure Name: Intubation Date/Time: 08/10/2021 8:04 AM  Performed by: Reece Agar, CRNAPre-anesthesia Checklist: Patient identified, Emergency Drugs available, Suction available and Patient being monitored Patient Re-evaluated:Patient Re-evaluated prior to induction Oxygen Delivery Method: Circle System Utilized Preoxygenation: Pre-oxygenation with 100% oxygen Induction Type: IV induction Ventilation: Mask ventilation without difficulty Laryngoscope Size: Mac and 3 Grade View: Grade I Tube type: Oral Tube size: 7.0 mm Number of attempts: 1 Airway Equipment and Method: Stylet Placement Confirmation: ETT inserted through vocal cords under direct vision, positive ETCO2 and breath sounds checked- equal and bilateral Secured at: 22 cm Tube secured with: Tape Dental Injury: Teeth and Oropharynx as per pre-operative assessment

## 2021-08-10 NOTE — Transfer of Care (Signed)
Immediate Anesthesia Transfer of Care Note  Patient: [name]  Procedure(s) Performed: LEFT HIP REMOVAL NAIL (Left: Hip)  Patient Location: PACU  Anesthesia Type:General  Level of Consciousness: awake and alert   Airway & Oxygen Therapy: Patient Spontanous Breathing and Patient connected to face mask oxygen  Post-op Assessment: Report given to RN and Post -op Vital signs reviewed and stable  Post vital signs: Reviewed and stable  Last Vitals:  Vitals Value Taken Time  BP 144/68 08/10/21 0921  Temp    Pulse 63 08/10/21 0922  Resp 16 08/10/21 0922  SpO2 99 % 08/10/21 0922  Vitals shown include unvalidated device data.  Last Pain:  Vitals:   08/10/21 0711  TempSrc:   PainSc: 8       Patients Stated Pain Goal: 2 (64/68/03 2122)  Complications: No notable events documented.

## 2021-08-10 NOTE — Op Note (Signed)
08/10/2021  9:15 AM  PATIENT:  [name]    PRE-OPERATIVE DIAGNOSIS:  Infected Left Hip Nail  POST-OPERATIVE DIAGNOSIS:  Same  PROCEDURE:  LEFT HIP REMOVAL AFFIXES NAIL  Partial resection of the femur for osteomyelitis. Resection of infected soft tissue this was sent for cultures. Local tissue rearrangement for wound closure 20 x 15 cm.    SURGEON:  Newt Minion, MD  PHYSICIAN ASSISTANT:None ANESTHESIA:   General  PREOPERATIVE INDICATIONS:  [name] is a  56 y.o. female with a diagnosis of Infected Left Hip Nail who failed conservative measures and elected for surgical management.    The risks benefits and alternatives were discussed with the patient preoperatively including but not limited to the risks of infection, bleeding, nerve injury, cardiopulmonary complications, the need for revision surgery, among others, and the patient was willing to proceed.  OPERATIVE IMPLANTS: Removal of affixes trochanteric nail  @ENCIMAGES @  OPERATIVE FINDINGS: Patient had an extensive infection of bone and soft tissue with purulent abscess.  Soft tissue and bone resected and sent for cultures.   OPERATIVE PROCEDURE: Patient is brought the operating room and underwent a general anesthetic.  After adequate levels anesthesia were obtained patient was placed in the right lateral decubitus position with the left side up and the left lower extremity was prepped using DuraPrep draped into a sterile field with a shower curtain.  Patient had several areas of purulent drainage as well as the previous incisions.  This necessitated a large extensile incision to resect the purulent draining abscess sinus tracts and incorporate with the previous incision.  This left a wound that was 20 x 15 cm.  Dissecting down there was purulent abscesses and necrotic soft tissue.  The purulence and necrotic tissue was sent for cultures.  The compression screw for the locking bolt was loosened and the locking bolt  was removed.  The nail was then inserted to the nail extraction jig.  The distal locking screw was then removed and the nail removed without complications.  Bony resection of the femur was performed areas of necrotic bone.  Further soft tissue was resected.  The wound was irrigated normal saline electrocautery was used hemostasis.  Local tissue rearrangement was used to close the wound that was 20 x 15 cm.  The incision was closed using 2-0 nylon a Mepilex dressing was applied.  Patient was extubated taken the PACU in stable condition   DISCHARGE PLANNING:  Antibiotic duration: Continue antibiotics adjust according to tissue cultures.  Weightbearing: Nonweightbearing on the left secondary to the femur fracture  Pain medication: Opioid pathway ordered  Dressing care/ Wound VAC: Dry dressing reinforce as needed  Ambulatory devices: Walker for transfers  Discharge to: Anticipate discharge to skilled nursing.  Follow-up: In the office 1 week post operative.

## 2021-08-10 NOTE — Consult Note (Signed)
ORTHOPAEDIC CONSULTATION  REQUESTING PHYSICIAN: [place] Eric J, DO  Chief Complaint: Acute left supracondylar femur fracture and chronic proximal femur fracture with retained intertrochanteric nail on the left side.  HPI: [name] is a 56 y.o. female who presents with history of diabetes who is status post a left transtibial amputation.  Patient recently sustained a intercondylar femoral fracture on the left and has a chronic draining intertrochanteric hip nail.  Past Medical History:  Diagnosis Date   Anxiety 2002   Chest tightness    Chronic kidney disease    Depression 2001   Diabetes type 1, uncontrolled    at age 70   Diabetic neuropathy ([place])    Essential hypertension 2015   GERD (gastroesophageal reflux disease)    about age of 55   Headache    Nausea and vomiting in adult    Stroke [facility])    Urinary frequency    Yeast vaginitis    Past Surgical History:  Procedure Laterality Date   ACHILLES TENDON SURGERY Left 03/10/2013   Procedure: LEFT CHOPART AMPUTATION/ LEFT TENDON ACHILLES [place];  Surgeon: Wylene Simmer, MD;  Location: [place];  Service: Orthopedics;  Laterality: Left;   AMPUTATION Left 06/15/2012   Procedure: AMPUTATION DIGIT;  Surgeon: Meredith Pel, MD;  Location: [place];  Service: Orthopedics;  Laterality: Left;  Left great toe revision amputation   AMPUTATION Left 01/12/2020   Procedure: AMPUTATION BELOW KNEE;  Surgeon: Wylene Simmer, MD;  Location: [place];  Service: Orthopedics;  Laterality: Left;   AV FISTULA PLACEMENT Left 01/09/2020   Procedure: LEFT UPPER ARM ARTERIOVENOUS (AV) FISTULA CREATION;  Surgeon: Cherre Robins, MD;  Location: [place];  Service: Vascular;  Laterality: Left;   ENDOMETRIAL ABLATION     I & D EXTREMITY Left 01/19/2020   Procedure: IRRIGATION AND DEBRIDEMENT Left Hip;  Surgeon: Rod Can, MD;  Location: [place];  Service: Orthopedics;  Laterality: Left;   INSERTION OF DIALYSIS CATHETER Right 01/09/2020   Procedure:  INSERTION OF DIALYSIS CATHETER;  Surgeon: Cherre Robins, MD;  Location: [place];  Service: Vascular;  Laterality: Right;   INTRAMEDULLARY (IM) NAIL INTERTROCHANTERIC Left 12/29/2019   Procedure: INTRAMEDULLARY (IM) NAIL INTERTROCHANTRIC;  Surgeon: Rod Can, MD;  Location: [facility];  Service: Orthopedics;  Laterality: Left;   IR ANGIOGRAM EXTREMITY LEFT  12/17/2018   IR FEM POP ART ATHERECT INC PTA MOD SED  12/17/2018   IR RADIOLOGIST EVAL & MGMT  11/30/2018   IR RADIOLOGIST EVAL & MGMT  12/09/2018   IR RADIOLOGIST EVAL & MGMT  02/08/2019   IR US GUIDE VASC ACCESS RIGHT  12/17/2018   LAPAROSCOPIC CHOLECYSTECTOMY  01/30/2015   [facility]    Social History   Socioeconomic History   Marital status: Divorced    Spouse name: Not on file   Number of children: 1   Years of education: Not on file   Highest education level: Not on file  Occupational History    Employer: [employer]  Tobacco Use   Smoking status: Light Smoker    Packs/day: 0.25    Years: 22.00    Total pack years: 5.50    Types: Cigarettes   Smokeless tobacco: Never  Vaping Use   Vaping Use: Never used  Substance and Sexual Activity   Alcohol use: Yes    Alcohol/week: 0.0 standard drinks of alcohol    Comment: sociial   Drug use: No   Sexual activity: Yes    Partners: Male  Other Topics Concern   Not on file  Social History Narrative   Lives at home with her daughter   Right handed   Caffeine: 3 cups daily   Social Determinants of Health   Financial Resource Strain: Not on file  Food Insecurity: Not on file  Transportation Needs: Not on file  Physical Activity: Not on file  Stress: Not on file  Social Connections: Not on file   Family History  Problem Relation Age of Onset   Diabetes Mother    Heart attack Mother    Heart disease Mother        before age 34   Hypertension Mother    Hyperlipidemia Mother    Diabetes Father    Heart attack Father    Heart disease Father        before age 48    Diabetes Sister    Hypertension Sister    - negative except otherwise stated in the family history section Allergies  Allergen Reactions   Trazodone Swelling   Latex Rash   Lidocaine Itching   Prior to Admission medications   Medication Sig Start Date End Date Taking? Authorizing Provider  acetaminophen (TYLENOL) 500 MG tablet Take 1,000 mg by mouth every 6 (six) hours as needed for mild pain.   Yes [provider]  alosetron (LOTRONEX) 1 MG tablet Take 1 mg by mouth 2 (two) times daily. 12/19/19  Yes [provider]  amitriptyline (ELAVIL) 25 MG tablet Take 25 mg by mouth at bedtime. 07/02/21  Yes [provider]  amLODipine (NORVASC) 5 MG tablet Take 1 tablet (5 mg total) by mouth daily. 02/21/20  Yes Love, Ivan Anchors, PA-C  atorvastatin (LIPITOR) 40 MG tablet TAKE 1 TABLET (40 MG TOTAL) BY MOUTH DAILY AT 6 PM. 02/22/19  Yes Fulp, Cammie, MD  Biotin 10 MG CAPS Take 1 capsule by mouth daily. 06/05/21  Yes [provider]  carvedilol (COREG) 6.25 MG tablet Take 1 tablet (6.25 mg total) by mouth 2 (two) times daily. 02/21/20  Yes Love, Ivan Anchors, PA-C  Cinnamon 500 MG capsule Take 500 mg by mouth 2 (two) times daily.   Yes [provider]  cyclobenzaprine (FLEXERIL) 10 MG tablet Take 10 mg by mouth 3 (three) times daily as needed for muscle spasms. 07/28/20  Yes [provider]  D3-50 1.25 MG (50000 UT) capsule Take 50,000 Units by mouth as directed. Monday, wed, Friday 05/31/20  Yes [provider]  dexlansoprazole (DEXILANT) 60 MG capsule Take 60 mg by mouth daily.   Yes [provider]  diphenoxylate-atropine (LOMOTIL) 2.5-0.025 MG tablet Take 1 tablet by mouth every 12 (twelve) hours as needed for diarrhea or loose stools.   Yes [provider]  DULoxetine (CYMBALTA) 60 MG capsule Take 60 mg by mouth daily. 07/17/20  Yes [provider]  hydrALAZINE (APRESOLINE) 100 MG tablet Take 1 tablet (100 mg total) by  mouth 3 (three) times daily. 02/21/20  Yes Love, Ivan Anchors, PA-C  insulin aspart (NOVOLOG) 100 UNIT/ML injection Inject 6 Units into the skin 3 (three) times daily with meals. 08/05/21  Yes Lorella Nimrod, MD  insulin glargine (LANTUS) 100 UNIT/ML injection Inject 0.12 mLs (12 Units total) into the skin at bedtime. 02/21/20  Yes Love, Ivan Anchors, PA-C  Menthol-Methyl Salicylate (MUSCLE RUB) 10-15 % CREA Apply 1 application topically as needed for muscle pain. 02/02/20  Yes Love, Ivan Anchors, PA-C  naloxone [facility]) nasal spray 4 mg/0.1 mL 1 spray as directed.  04/27/20  Yes [provider]  nicotine (NICODERM CQ - DOSED IN MG/24 HOURS) 21 mg/24hr patch Place 1 patch (21 mg total) onto the skin daily as needed (nicotine craving). 07/25/21  Yes Emeterio Reeve, DO  oxyCODONE-acetaminophen (PERCOCET) 7.5-325 MG tablet Take 1 tablet by mouth every 6 (six) hours as needed for moderate pain or severe pain. 08/05/21  Yes Lorella Nimrod, MD  polyethylene glycol (MIRALAX / GLYCOLAX) 17 g packet Take 17 g by mouth daily as needed for mild constipation. 07/25/21  Yes Emeterio Reeve, DO  sevelamer carbonate (RENVELA) 800 MG tablet Take 2 tablets (1,600 mg total) by mouth 3 (three) times daily with meals. 02/21/20  Yes Love, Ivan Anchors, PA-C  sodium chloride (OCEAN) 0.65 % SOLN nasal spray Place 1 spray into both nostrils as needed for congestion (nose irritation). 02/02/20  Yes Love, Ivan Anchors, PA-C  insulin aspart (NOVOLOG) 100 UNIT/ML injection Inject 0-15 Units into the skin 3 (three) times daily with meals. 08/05/21   Lorella Nimrod, MD  insulin aspart (NOVOLOG) 100 UNIT/ML injection Inject 0-5 Units into the skin at bedtime. 08/05/21   Lorella Nimrod, MD  iron sucrose in sodium chloride 0.9 % 100 mL Iron Sucrose (Venofer) 05/21/20   [provider]  meropenem 500 mg in sodium chloride 0.9 % 100 mL Inject 500 mg into the vein daily. 08/05/21   Lorella Nimrod, MD  pantoprazole (PROTONIX) 40 MG tablet Take 1 tablet  (40 mg total) by mouth daily. 07/26/21   Emeterio Reeve, DO   No results found. - pertinent xrays, CT, MRI studies were reviewed and independently interpreted  Positive ROS: All other systems have been reviewed and were otherwise negative with the exception of those mentioned in the HPI and as above.  Physical Exam: General: Alert, no acute distress Psychiatric: Patient is competent for consent with normal mood and affect Lymphatic: No axillary or cervical lymphadenopathy Cardiovascular: No pedal edema Respiratory: No cyanosis, no use of accessory musculature GI: No organomegaly, abdomen is soft and non-tender    Images:  @ENCIMAGES @  Labs:  Lab Results  Component Value Date   HGBA1C 7.1 (H) 07/22/2021   HGBA1C 6.2 (H) 12/29/2019   HGBA1C 6.7 (A) 10/29/2018   ESRSEDRATE 86 (H) 08/02/2021   ESRSEDRATE 29 03/09/2020   ESRSEDRATE 22 02/20/2020   CRP 4.9 (H) 08/02/2021   CRP 25.8 (H) 03/09/2020   CRP 0.8 02/20/2020   REPTSTATUS 07/31/2021 FINAL 07/31/2021   REPTSTATUS 08/03/2021 FINAL 07/31/2021   GRAMSTAIN  07/31/2021    NO SQUAMOUS EPITHELIAL CELLS SEEN RARE WBC SEEN RARE GRAM POSITIVE RODS    CULT RARE CANDIDA ALBICANS 07/31/2021   LABORGA ENTEROBACTER CLOACAE 01/19/2020    Lab Results  Component Value Date   ALBUMIN 2.1 (L) 08/10/2021   ALBUMIN 2.1 (L) 08/09/2021   ALBUMIN 2.2 (L) 08/08/2021        Latest Ref Rng & Units 08/10/2021    2:14 AM 08/09/2021    5:00 AM 08/08/2021    2:36 AM  CBC EXTENDED  WBC 4.0 - 10.5 K/uL 8.6  8.0  10.1   RBC 3.87 - 5.11 MIL/uL 2.67  2.65  2.75   Hemoglobin 12.0 - 15.0 g/dL 7.9  8.0  8.1   HCT 36.0 - 46.0 % 25.5  24.9  25.9   Platelets 150 - 400 K/uL 222  212  255     Neurologic: Patient does not have protective sensation bilateral lower extremities.   MUSCULOSKELETAL:   Skin: Examination patient has  chronic draining sinus tracts from the trochanteric nail.  Patient's albumin is 2.1 hemoglobin 7.9 white cell count  8.6 with a hemoglobin A1c of 7.1 sed rate of 86 and C-reactive protein of 4.9.  Recently her CRP has been as high as 25.  Radiographs shows chronic lytic changes around the intertrochanteric hip nail on the left with an acute minimally displaced supracondylar femur fracture on the left.  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  Acute supracondylar femur fracture on the left.  Plan: Plan: We will plan for removal of the trochanteric nail obtain bone and soft tissue for cultures.  Anticipate patient will need about 6 weeks of IV antibiotics could be provided dialysis.  Thank you for the consult and the opportunity to see Ms. Evie Lacks, MD [facility] [phone] 7:47 AM

## 2021-08-10 NOTE — Anesthesia Postprocedure Evaluation (Signed)
Anesthesia Post Note  Patient: BRINN WESTBY  Procedure(s) Performed: LEFT HIP REMOVAL NAIL (Left: Hip)     Patient location during evaluation: PACU Anesthesia Type: General Level of consciousness: awake and alert Pain management: pain level controlled Vital Signs Assessment: post-procedure vital signs reviewed and stable Respiratory status: spontaneous breathing, nonlabored ventilation, respiratory function stable and patient connected to nasal cannula oxygen Cardiovascular status: blood pressure returned to baseline and stable Postop Assessment: no apparent nausea or vomiting Anesthetic complications: no   No notable events documented.  Last Vitals:  Vitals:   08/10/21 1030 08/10/21 1630  BP: (!) 128/54 133/61  Pulse: 68 65  Resp: 18 18  Temp: 36.7 C 36.7 C  SpO2: 96% 94%    Last Pain:  Vitals:   08/10/21 1630  TempSrc: Oral  PainSc:                  March Rummage Lian Tanori

## 2021-08-10 NOTE — Progress Notes (Addendum)
[place] for Infectious Disease    Date of Admission:  08/07/2021     ID: [name] is a 56 y.o. female with   Principal Problem:   Septic arthritis of hip ([place]) Active Problems:   Type 2 diabetes mellitus with renal complication (HCC)   ESRD (end stage renal disease) on dialysis [facility])   Essential hypertension   Closed fracture of left distal femur (HCC)   Skin abscess L hip   Hardware complicating wound infection ([place])   Subacute osteomyelitis of left femur (HCC)    Subjective: Having some pain from surgery this morning. Otherwise is afebrile. She underwent removal of left hip affixed nail with partial resection of soft tissue and femur that were non-viable.  Medications:   amitriptyline  25 mg Oral QHS   amLODipine  5 mg Oral Daily   atorvastatin  40 mg Oral q1800   carvedilol  6.25 mg Oral BID   Chlorhexidine Gluconate Cloth  6 each Topical Q0600   docusate sodium  100 mg Oral BID   DULoxetine  60 mg Oral Daily   fentaNYL       insulin aspart  0-6 Units Subcutaneous Q4H   metroNIDAZOLE  500 mg Oral Q12H   pantoprazole  40 mg Oral Daily    Objective: Vital signs in last 24 hours: Temp:  [97.3 F (36.3 C)-98.8 F (37.1 C)] 98.1 F (36.7 C) (06/10 1630) Pulse Rate:  [56-69] 65 (06/10 1630) Resp:  [11-18] 18 (06/10 1630) BP: (116-148)/(54-68) 133/61 (06/10 1630) SpO2:  [94 %-99 %] 94 % (06/10 1630)  Physical Exam  Constitutional:  oriented to person, place, and time. appears well-developed and well-nourished. No distress.  HENT: /AT, PERRLA, no scleral icterus Mouth/Throat: Oropharynx is clear and moist. No oropharyngeal exudate.  Cardiovascular: Normal rate, regular rhythm and normal heart sounds. Exam reveals no gallop and no friction rub.  No murmur heard.  Pulmonary/Chest: Effort normal and breath sounds normal. No respiratory distress.  has no wheezes.  Neck = supple, no nuchal rigidity Abdominal: Soft. Bowel sounds are normal.  exhibits no  distension. There is no tenderness.  Lymphadenopathy: no cervical adenopathy. No axillary adenopathy Neurological: alert and oriented to person, place, and time.  Skin: Skin is warm and dry. No rash noted. No erythema.  Psychiatric: a normal mood and affect.  behavior is normal.    Lab Results Recent Labs    08/10/21 0214 08/10/21 1237  WBC 8.6 14.6*  HGB 7.9* 7.0*  HCT 25.5* 22.1*  NA 130* 126*  K 4.4 4.7  CL 93* 90*  CO2 28 25  BUN 24* 31*  CREATININE 3.36* 3.98*   Liver Panel Recent Labs    08/10/21 0214 08/10/21 1237  ALBUMIN 2.1* 2.0*   Lab Results  Component Value Date   ESRSEDRATE 86 (H) 08/02/2021     Microbiology:micro historical cx RARE ENTEROBACTER CLOACAE  MODERATE BACTEROIDES FRAGILIS  BETA LACTAMASE POSITIVE  Studies/Results: No results found.   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 Will have opat listed below and will sign off.  Will follow up with cx results to see if any abtx changes are needed Also have follow up appt for patient in the ID clinic Wound care per ortho  Leukocytosis improving  --------------------------------- Diagnosis: Septic arthritis of left hip/HW infection with femur involvement  Culture Result: negative ( hx of enterobacter)  Allergies  Allergen Reactions   Trazodone Swelling   Latex Rash   Lidocaine Itching    OPAT Orders Discharge antibiotics to be given via PICC line Discharge antibiotics: Per pharmacy protocol cefepime with HD PLUS oral metronidazole 500mg  po BID  Duration: 6 wk End Date: 09/22/2021    Clinic Follow Up Appt: In 5 wk  @ [facility], either dr Denyse Fillion, or greg [place] for Infectious Diseases Pager: [phone]  08/10/2021, 4:49 PM

## 2021-08-11 ENCOUNTER — Encounter (HOSPITAL_COMMUNITY): Payer: Self-pay | Admitting: Orthopedic Surgery

## 2021-08-11 DIAGNOSIS — N186 End stage renal disease: Secondary | ICD-10-CM | POA: Diagnosis not present

## 2021-08-11 DIAGNOSIS — T847XXA Infection and inflammatory reaction due to other internal orthopedic prosthetic devices, implants and grafts, initial encounter: Secondary | ICD-10-CM

## 2021-08-11 DIAGNOSIS — M009 Pyogenic arthritis, unspecified: Secondary | ICD-10-CM | POA: Diagnosis not present

## 2021-08-11 DIAGNOSIS — I10 Essential (primary) hypertension: Secondary | ICD-10-CM | POA: Diagnosis not present

## 2021-08-11 DIAGNOSIS — S72492A Other fracture of lower end of left femur, initial encounter for closed fracture: Secondary | ICD-10-CM | POA: Diagnosis not present

## 2021-08-11 DIAGNOSIS — M86252 Subacute osteomyelitis, left femur: Secondary | ICD-10-CM

## 2021-08-11 LAB — VITAMIN B12: Vitamin B-12: 646 pg/mL (ref 180–914)

## 2021-08-11 LAB — HEMOGLOBIN AND HEMATOCRIT, BLOOD
HCT: 20.5 % — ABNORMAL LOW (ref 36.0–46.0)
Hemoglobin: 6.8 g/dL — CL (ref 12.0–15.0)

## 2021-08-11 LAB — RENAL FUNCTION PANEL
Albumin: 2.1 g/dL — ABNORMAL LOW (ref 3.5–5.0)
Anion gap: 9 (ref 5–15)
BUN: 37 mg/dL — ABNORMAL HIGH (ref 6–20)
CO2: 25 mmol/L (ref 22–32)
Calcium: 8.1 mg/dL — ABNORMAL LOW (ref 8.9–10.3)
Chloride: 89 mmol/L — ABNORMAL LOW (ref 98–111)
Creatinine, Ser: 4.66 mg/dL — ABNORMAL HIGH (ref 0.44–1.00)
GFR, Estimated: 11 mL/min — ABNORMAL LOW
Glucose, Bld: 202 mg/dL — ABNORMAL HIGH (ref 70–99)
Phosphorus: 5.1 mg/dL — ABNORMAL HIGH (ref 2.5–4.6)
Potassium: 5.1 mmol/L (ref 3.5–5.1)
Sodium: 123 mmol/L — ABNORMAL LOW (ref 135–145)

## 2021-08-11 LAB — FOLATE: Folate: 14.7 ng/mL (ref >5.9)

## 2021-08-11 LAB — CBC WITH DIFFERENTIAL/PLATELET
Abs Immature Granulocytes: 0.12 K/uL — ABNORMAL HIGH (ref 0.00–0.07)
Basophils Absolute: 0 K/uL (ref 0.0–0.1)
Basophils Relative: 0 %
Eosinophils Absolute: 0 K/uL (ref 0.0–0.5)
Eosinophils Relative: 0 %
HCT: 18.3 % — ABNORMAL LOW (ref 36.0–46.0)
Hemoglobin: 5.8 g/dL — CL (ref 12.0–15.0)
Immature Granulocytes: 2 %
Lymphocytes Relative: 12 %
Lymphs Abs: 0.8 K/uL (ref 0.7–4.0)
MCH: 29.6 pg (ref 26.0–34.0)
MCHC: 31.7 g/dL (ref 30.0–36.0)
MCV: 93.4 fL (ref 80.0–100.0)
Monocytes Absolute: 0.7 K/uL (ref 0.1–1.0)
Monocytes Relative: 10 %
Neutro Abs: 5.5 K/uL (ref 1.7–7.7)
Neutrophils Relative %: 76 %
Platelets: 245 K/uL (ref 150–400)
RBC: 1.96 MIL/uL — ABNORMAL LOW (ref 3.87–5.11)
RDW: 14.8 % (ref 11.5–15.5)
WBC: 7.2 K/uL (ref 4.0–10.5)
nRBC: 0 % (ref 0.0–0.2)

## 2021-08-11 LAB — GLUCOSE, CAPILLARY
Glucose-Capillary: 151 mg/dL — ABNORMAL HIGH (ref 70–99)
Glucose-Capillary: 171 mg/dL — ABNORMAL HIGH (ref 70–99)
Glucose-Capillary: 178 mg/dL — ABNORMAL HIGH (ref 70–99)
Glucose-Capillary: 210 mg/dL — ABNORMAL HIGH (ref 70–99)
Glucose-Capillary: 211 mg/dL — ABNORMAL HIGH (ref 70–99)
Glucose-Capillary: 229 mg/dL — ABNORMAL HIGH (ref 70–99)

## 2021-08-11 LAB — FERRITIN: Ferritin: 983 ng/mL — ABNORMAL HIGH (ref 11–307)

## 2021-08-11 LAB — RETICULOCYTES
Immature Retic Fract: 12.2 % (ref 2.3–15.9)
RBC.: 2.22 MIL/uL — ABNORMAL LOW (ref 3.87–5.11)
Retic Count, Absolute: 70.2 10*3/uL (ref 19.0–186.0)
Retic Ct Pct: 3.2 % — ABNORMAL HIGH (ref 0.4–3.1)

## 2021-08-11 LAB — PREPARE RBC (CROSSMATCH)

## 2021-08-11 LAB — IRON AND TIBC
Iron: 173 ug/dL — ABNORMAL HIGH (ref 28–170)
Saturation Ratios: 84 % — ABNORMAL HIGH (ref 10.4–31.8)
TIBC: 206 ug/dL — ABNORMAL LOW (ref 250–450)
UIBC: 33 ug/dL

## 2021-08-11 LAB — HEPATITIS B SURFACE ANTIBODY,QUALITATIVE: Hep B S Ab: NONREACTIVE

## 2021-08-11 LAB — HEPATITIS B SURFACE ANTIGEN: Hepatitis B Surface Ag: NONREACTIVE

## 2021-08-11 LAB — HEPATITIS B CORE ANTIBODY, TOTAL: Hep B Core Total Ab: NONREACTIVE

## 2021-08-11 LAB — HEPATITIS C ANTIBODY: HCV Ab: NONREACTIVE

## 2021-08-11 LAB — D-DIMER, QUANTITATIVE: D-Dimer, Quant: 2.52 ug/mL-FEU — ABNORMAL HIGH (ref 0.00–0.50)

## 2021-08-11 MED ORDER — SODIUM CHLORIDE 0.9 % IV SOLN: INTRAVENOUS | Status: DC

## 2021-08-11 MED ORDER — VANCOMYCIN HCL 10 G IV SOLR
2500.0000 mg | Freq: Once | INTRAVENOUS | Status: AC
  Administered 2021-08-11: 2500 mg via INTRAVENOUS
  Filled 2021-08-11: qty 2500

## 2021-08-11 MED ORDER — ALTEPLASE 2 MG IJ SOLR: 2.0000 mg | Freq: Once | INTRAMUSCULAR | Status: DC | PRN

## 2021-08-11 MED ORDER — VANCOMYCIN HCL IN DEXTROSE 1-5 GM/200ML-% IV SOLN
1000.0000 mg | INTRAVENOUS | Status: DC
  Administered 2021-08-12: 1000 mg via INTRAVENOUS
  Filled 2021-08-11: qty 200

## 2021-08-11 MED ORDER — ACETAMINOPHEN 325 MG PO TABS: 325.0000 mg | ORAL_TABLET | Freq: Four times a day (QID) | ORAL | Status: DC | PRN

## 2021-08-11 MED ORDER — SODIUM CHLORIDE 0.9% IV SOLUTION: Freq: Once | INTRAVENOUS | Status: DC

## 2021-08-11 MED ORDER — LIDOCAINE HCL (PF) 1 % IJ SOLN: 5.0000 mL | INTRAMUSCULAR | Status: DC | PRN

## 2021-08-11 MED ORDER — HEPARIN SODIUM (PORCINE) 1000 UNIT/ML DIALYSIS: 1000.0000 [IU] | INTRAMUSCULAR | Status: DC | PRN

## 2021-08-11 NOTE — Plan of Care (Signed)

## 2021-08-11 NOTE — Progress Notes (Signed)
PT Cancellation Note  Patient Details Name: [name] MRN: [id number] DOB: [birth year]   Cancelled Treatment:    Reason Eval/Treat Not Completed: Medical issues which prohibited therapy this afternoon. RN reports concern for DVT and pt is awaiting screening, pt also continues to have low Hgb and is awaiting 2nd unit of blood at this time. Will continue to follow and evaluate as appropriate.   [place] Car[place], PT, DPT   Acute Rehabilitation Department   Sandra Cockayne 08/11/2021, 5:35 PM

## 2021-08-11 NOTE — Progress Notes (Addendum)
Pharmacy Antibiotic Note  ALEXSA FLAUM is a 56 y.o. female admitted on 08/07/2021 with L-hip PJI. Pharmacy has been consulted for Cefepime dosing.  Patient now s/p I*D for L-hip PJI on 6/10. Historical culture data growing Enterobacter cloacae and Bacteroides fragilis. WBC WNL, afebrile.   Plan: - Continue Cefepime 2g post HD on MWF - Continue flagyl 500 mg PO Q12H  - Will plan OPAT for abx to be given with HD   ADDENDUM: OR cultures growing rare staph aureus. Pharmacy consulted to dose Vancomycin.   GIVE Vancomycin 2,500 mg IV x1 (Wt used: 99.4 kg)  THEN Vancomycin 1,000 mg IV with HD MWF   Height: 5\' 10"  (177.8 cm) Weight: 99.4 kg (219 lb 2.2 oz) IBW/kg (Calculated) : 68.5  Temp (24hrs), Avg:98 F (36.7 C), Min:97.3 F (36.3 C), Max:98.8 F (37.1 C)  Recent Labs  Lab 08/08/21 0236 08/09/21 0500 08/10/21 0214 08/10/21 1237 08/11/21 0154  WBC 10.1 8.0 8.6 14.6* 7.2  CREATININE 6.62* 4.53* 3.36* 3.98* 4.66*     Estimated Creatinine Clearance: 17.6 mL/min (A) (by C-G formula based on SCr of 4.66 mg/dL (H)).    Allergies  Allergen Reactions   Trazodone Swelling   Latex Rash   Lidocaine Itching   Antimicrobials this admission:  Meropenem 5/30 >> 6/8 Cefepime 6/9>> Flagyl 6/9>>   Microbiology results:  6/10 wound cx: rare staph aureus   Adria Dill, PharmD PGY-1 Acute Care Resident  08/11/2021 7:00 AM

## 2021-08-11 NOTE — Progress Notes (Signed)
PROGRESS NOTE    [name]  EXN:[id number] DOB: [birth year] DOA: 08/07/2021 PCP: Julian Hy, PA-C    Brief Narrative:   [name] is a 56 y.o. female with past medical history significant for HTN, HLD, ESRD on HD MWF, history of left BKA 2021, depression/anxiety, history of CVA, tobacco use disorder, type 2 diabetes mellitus, insulin-dependent, insomnia, chronic pain syndrome who presented to [facility] on 6/7 via transfer from [facility] for distal left femur fracture secondary to mechanical fall, deemed nonoperative per [facility] orthopedics.  During this hospital course, patient was treated for multifocal pneumonia Ona, started on antibiotics on 5/27 in which she finished a course on 6/7.  Urine culture, sputum culture, blood cultures all remain negative.  Underwent drainage of superficial abscess left hip on 5/28, culture negative.  MRI left hip concerning for 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  Orthopedics initially recommended I&D, hardware removal and placement of antibiotic beads but patient declined surgery.  Was seen by infectious disease during her hospitalization who recommended I&D but orthopedics felt infection would recur and she would need a very extensive debridement.  IR was also consulted but patient declined CT-guided drainage procedure requested surgical opinion from her primary orthopedist, Dr. Lyla Glassing who accepted the patient for transfer to [facility] under the hospital service.  Patient was initially treated with cefepime but changed to meropenem due to prior history of resistant Enterobacter and Bacteroides.  Assessment & Plan:   Left hip septic arthritis/osteomyelitis, abscess, hardware involvement Patient presenting as a transfer from [facility] after imaging studies notable for ununited left hip fracture with extensive lucency around hip hardware consistent with extensive  osteomyelitis, 6 cm posterior fluid collection concerning for abscess that appears with a sinus track to skin with probable septic arthritis, mild fasciitis, cellulitis.  Orthopedics at [facility] initially recommended I&D with hardware removal and placement of antibiotic beads but patient declined surgery.  IR was also consulted patient declined CT-guided drainage of the procedure requested surgical opinion from her primary orthopedist Dr. Lyla Glassing.  Patient was initially treated with cefepime but changed to meropenem by infectious disease on 5/30 due to prior history of resistant Enterobacter and Bacteroides.  Patient underwent hardware removal of left hip affixes nail with partial resection of tuft tissue and femur that were nonviable by Dr. [name] on 08/10/2021.  --Orthopedic/infectious disease following, appreciate assistance --Blood Cultures 5/27 x 2: No growth x 5 days --Superficial wound culture 5/30: no organism seen on Gram stain, no growth x3 days --Operative culture 6/10: Gram stain with no organism, further culture pending --Cefepime 2 g IV every Monday/Wednesday/Friday --Metronidazole 500 mg PO q12h --ID plans 6-week IV antibiotic treatment with HD on cefepime and oral metronidazole using 6/11 as day 1  Acute postoperative blood loss anemia Hemoglobin dropped from 7.0 to 5.8 this morning, some blood oozing overnight from operative site with multiple dressing changes overnight.  Pressure dressing placed overnight. --Transfuse PRBC today, H&H following transfusion --Check anemia panel --Maintain hemoglobin greater than 7.0 --CBC daily  Left femoral condyle fracture Evaluated by orthopedics, currently plan nonoperative management. --Nonweightbearing left lower extremity --Pain control  Skin abscess left hip Previously I&D with copious purulent fluid obtained, wound culture negative. --Continue local wound care  Hyponatremia Sodium 121 on admission 6/7, has missed several HD sessions  prior, did not receive dialysis yesterday.  Likely secondary to hypervolemic hyponatremia. --Na 121>126>130  Type 2 diabetes mellitus, insulin-dependent Hemoglobin A1c 7.1  on 07/22/2021, well controlled.   --Hold long-acting insulin for now --very Sensitive SSI for coverage --CBGs qAC/HS  ESRD on HD MWF --Nephrology consulted for continued HD while inpatient  Depression/Anxiety --Amitriptyline 25 mg p.o. nightly --Duloxetine 60 mg p.o. daily  Essential hypertension --Amlodipine 5 mg p.o. daily --Carvedilol 6.25 mg p.o. twice daily --Holding home hydralazine --Continue to monitor BP closely  Obesity Body mass index is 31.44 kg/m.  Discussed with patient needs for aggressive lifestyle changes/weight loss as this complicates all facets of care.  Outpatient follow-up with PCP.      DVT prophylaxis: SCDs Start: 08/10/21 1056 SCDs Start: 08/07/21 2226    Code Status: Full Code Family Communication:   Disposition Plan:  Level of care: Med-Surg Status is: Inpatient Remains inpatient appropriate because: Pending surgical invention for septic arthritis, osteomyelitis, infected hardware, abscess left hip today, will likely need long-term antibiotics    Consultants:  Orthopedics, Dr. [name] Nephrology Infectious disease  Procedures:  None  Antimicrobials:  Azithromycin 5/21 - 5/30 Cefepime 5/21 - 5/28, 6/9>> Meropenem 5/30 - 6/8 Metronidazole 6/9>>   Subjective: Patient seen examined bedside, resting comfortably.  Lying in bed.  No specific complaints this morning other than some mild pain at operative site.  Overnight patient's hemoglobin dropped to 5.9 with several dressing changes due to bleeding through dressing.  Transfusing PRBC today.  No other specific questions or concerns at this time.  Denies headache, no fever/chills/night sweats, no nausea/vomiting/diarrhea, no chest pain, no palpitations, no shortness of breath, no abdominal pain, no focal weakness, no fatigue,  no paresthesias.  No acute events overnight per nursing staff.  Objective: Vitals:   08/11/21 0814 08/11/21 0915 08/11/21 0945 08/11/21 1010  BP: (!) 129/53 (!) 121/55 132/63 (!) 133/59  Pulse: 60 64 63 66  Resp: 18 18 18 18   Temp: 98.9 F (37.2 C) 98.5 F (36.9 C) 97.9 F (36.6 C) 98 F (36.7 C)  TempSrc: Oral Axillary Axillary   SpO2: 98% 98% 98%   Weight:      Height:       No intake or output data in the 24 hours ending 08/11/21 1054  Filed Weights   08/07/21 2020 08/08/21 1454 08/09/21 0800  Weight: 103 kg 104.3 kg 99.4 kg    Examination:  Physical Exam: GEN: NAD, alert and oriented x 3, chronically ill in appearance, appears older than stated age, obese HEENT: NCAT, PERRL, EOMI, sclera clear, MMM PULM: CTAB w/o wheezes/crackles, normal respiratory effort, on room air CV: RRR w/o M/G/R GI: abd soft, NTND, NABS, no R/G/M MSK: Left BKA noted, left thigh with dressing in place with pressure dressing in place clean/dry/intact, moves all extremities independently with preserved muscle strength NEURO: CN II-XII intact, no focal deficits, sensation to light touch intact PSYCH: normal mood/affect Integumentary: Left thigh wound as above, otherwise no other concerning rashes/lesions/wounds     Data Reviewed: I have personally reviewed following labs and imaging studies  CBC: Recent Labs  Lab 08/08/21 0236 08/09/21 0500 08/10/21 0214 08/10/21 1237 08/11/21 0154  WBC 10.1 8.0 8.6 14.6* 7.2  NEUTROABS  --   --   --   --  5.5  HGB 8.1* 8.0* 7.9* 7.0* 5.8*  HCT 25.9* 24.9* 25.5* 22.1* 18.3*  MCV 94.2 94.0 95.5 94.4 93.4  PLT 255 212 222 265 350   Basic Metabolic Panel: Recent Labs  Lab 08/08/21 0236 08/09/21 0500 08/10/21 0214 08/10/21 1237 08/11/21 0154  NA 121* 126* 130* 126* 123*  K 4.8 4.3  4.4 4.7 5.1  CL 87* 95* 93* 90* 89*  CO2 22 23 28 25 25   GLUCOSE 107* 137* 122* 261* 202*  BUN 63* 36* 24* 31* 37*  CREATININE 6.62* 4.53* 3.36* 3.98* 4.66*   CALCIUM 8.3* 7.7* 8.0* 8.0* 8.1*  PHOS 5.6* 4.6 3.9 5.4* 5.1*   GFR: Estimated Creatinine Clearance: 17.6 mL/min (A) (by C-G formula based on SCr of 4.66 mg/dL (H)). Liver Function Tests: Recent Labs  Lab 08/08/21 0236 08/09/21 0500 08/10/21 0214 08/10/21 1237 08/11/21 0154  ALBUMIN 2.2* 2.1* 2.1* 2.0* 2.1*   No results for input(s): "LIPASE", "AMYLASE" in the last 168 hours. No results for input(s): "AMMONIA" in the last 168 hours. Coagulation Profile: No results for input(s): "INR", "PROTIME" in the last 168 hours. Cardiac Enzymes: No results for input(s): "CKTOTAL", "CKMB", "CKMBINDEX", "TROPONINI" in the last 168 hours. BNP (last 3 results) No results for input(s): "PROBNP" in the last 8760 hours. HbA1C: No results for input(s): "HGBA1C" in the last 72 hours. CBG: Recent Labs  Lab 08/10/21 1548 08/10/21 1946 08/11/21 0013 08/11/21 0405 08/11/21 0754  GLUCAP 256* 238* 211* 229* 210*   Lipid Profile: No results for input(s): "CHOL", "HDL", "LDLCALC", "TRIG", "CHOLHDL", "LDLDIRECT" in the last 72 hours. Thyroid Function Tests: No results for input(s): "TSH", "T4TOTAL", "FREET4", "T3FREE", "THYROIDAB" in the last 72 hours. Anemia Panel: No results for input(s): "VITAMINB12", "FOLATE", "FERRITIN", "TIBC", "IRON", "RETICCTPCT" in the last 72 hours. Sepsis Labs: No results for input(s): "PROCALCITON", "LATICACIDVEN" in the last 168 hours.  Recent Results (from the past 240 hour(s))  Surgical pcr screen     Status: None   Collection Time: 08/08/21 11:57 PM   Specimen: Nasal Mucosa; Nasal Swab  Result Value Ref Range Status   MRSA, PCR NEGATIVE NEGATIVE Final   Staphylococcus aureus NEGATIVE NEGATIVE Final    Comment: (NOTE) The Xpert SA Assay (FDA approved for NASAL specimens in patients 84 years of age and older), is one component of a comprehensive surveillance program. It is not intended to diagnose infection nor to guide or monitor treatment. Performed at  [facility], [place] [address] [place], [place] [address]   Aerobic/Anaerobic Culture w Gram Stain (surgical/deep wound)     Status: None (Preliminary result)   Collection Time: 08/10/21  8:22 AM   Specimen: Wound; Abscess  Result Value Ref Range Status   Specimen Description TISSUE  Final   Special Requests LEFT HIP WOUND  Final   Gram Stain   Final    RARE WBC PRESENT,BOTH PMN AND MONONUCLEAR NO ORGANISMS SEEN    Culture   Final    CULTURE REINCUBATED FOR BETTER GROWTH Performed at [facility], [address] [place], [place] [address]    Report Status PENDING  Incomplete         Radiology Studies: No results found.      Scheduled Meds:  sodium chloride   Intravenous Once   amitriptyline  25 mg Oral QHS   amLODipine  5 mg Oral Daily   atorvastatin  40 mg Oral q1800   carvedilol  6.25 mg Oral BID   Chlorhexidine Gluconate Cloth  6 each Topical Q0600   docusate sodium  100 mg Oral BID   DULoxetine  60 mg Oral Daily   insulin aspart  0-6 Units Subcutaneous Q4H   metroNIDAZOLE  500 mg Oral Q12H   pantoprazole  40 mg Oral Daily   Continuous Infusions:  sodium chloride 10 mL/hr at 08/11/21 0945  ceFEPime (MAXIPIME) IV 2 g (08/09/21 1711)   methocarbamol (ROBAXIN) IV       LOS: 4 days    Time spent: 40 minutes spent on chart review, discussion with nursing staff, consultants, updating family and interview/physical exam; more than 50% of that time was spent in counseling and/or coordination of care.    Donna Tristan J [place] DO [facility] Available via Epic secure chat 7am-7pm After these hours, please refer to coverage provider listed on [email or web address] 08/11/2021, 10:54 AM

## 2021-08-11 NOTE — Plan of Care (Signed)
Floor coverage note, remote   Notified of hemoglobin of 5.8, baseline around 8.   Patient has been oozing from surgical incision and had blood soaked dressings  Hospital chart reviewed: Patient with complex past medical history and who is ESRD on HD who is s/p partial resection of the femur for osteomyelitis on 6/10  She is awake and alert per nursing report Vitals: BP 105/65 and pulse 58  Chart reviewed  Acute blood loss anemia, postoperative - Consented patient over phone - Type and cross 3 units and transfuse 1 unit - Nurse advised to inform orthopedist on-call about bleeding from incision -Monitor for symptoms of overload - Patient is on MWF dialysis so will need to inform nephrology in case dialysis needed sooner

## 2021-08-11 NOTE — Progress Notes (Signed)
[facility] Progress Note   Subjective:    Seen and examined patient at bedside. S/p proximal resection of femur and ROH 6/10. Informed of current Hgb 5.8. 1 unit PRBCs ordered by primary. More likely will need another unit. Patient reports pain but otherwise is doing well. Denies SOB, CP, and N/V.  Objective Vitals:   08/11/21 0325 08/11/21 0757 08/11/21 0814 08/11/21 0915  BP: 105/65 (!) 129/53 (!) 129/53 (!) 121/55  Pulse: (!) 58 62 60 64  Resp: 18 16 18 18   Temp:  98.5 F (36.9 C) 98.9 F (37.2 C) 98.5 F (36.9 C)  TempSrc:   Oral Axillary  SpO2: 97% 100% 98% 98%  Weight:      Height:       Physical Exam General: Awake and alert; NAD Heart: S1 and S2; No murmurs, gallops, or rubs Lungs: Clear anteriorly and laterally Abdomen: Soft and non-tender Extremities: S/p L BKA; trace edema noted R-side ABD and RLE Dialysis Access: L AVF (+) B/T   Filed Weights   08/07/21 2020 08/08/21 1454 08/09/21 0800  Weight: 103 kg 104.3 kg 99.4 kg   No intake or output data in the 24 hours ending 08/11/21 0933  Additional Objective Labs: Basic Metabolic Panel: Recent Labs  Lab 08/10/21 0214 08/10/21 1237 08/11/21 0154  NA 130* 126* 123*  K 4.4 4.7 5.1  CL 93* 90* 89*  CO2 28 25 25   GLUCOSE 122* 261* 202*  BUN 24* 31* 37*  CREATININE 3.36* 3.98* 4.66*  CALCIUM 8.0* 8.0* 8.1*  PHOS 3.9 5.4* 5.1*   Liver Function Tests: Recent Labs  Lab 08/10/21 0214 08/10/21 1237 08/11/21 0154  ALBUMIN 2.1* 2.0* 2.1*   No results for input(s): "LIPASE", "AMYLASE" in the last 168 hours. CBC: Recent Labs  Lab 08/08/21 0236 08/09/21 0500 08/10/21 0214 08/10/21 1237 08/11/21 0154  WBC 10.1 8.0 8.6 14.6* 7.2  NEUTROABS  --   --   --   --  5.5  HGB 8.1* 8.0* 7.9* 7.0* 5.8*  HCT 25.9* 24.9* 25.5* 22.1* 18.3*  MCV 94.2 94.0 95.5 94.4 93.4  PLT 255 212 222 265 245   Blood Culture    Component Value Date/Time   SDES TISSUE 08/10/2021 0822   SPECREQUEST LEFT HIP  WOUND 08/10/2021 0822   CULT PENDING 08/10/2021 0822   REPTSTATUS PENDING 08/10/2021 5093    Cardiac Enzymes: No results for input(s): "CKTOTAL", "CKMB", "CKMBINDEX", "TROPONINI" in the last 168 hours. CBG: Recent Labs  Lab 08/10/21 1548 08/10/21 1946 08/11/21 0013 08/11/21 0405 08/11/21 0754  GLUCAP 256* 238* 211* 229* 210*   Iron Studies: No results for input(s): "IRON", "TIBC", "TRANSFERRIN", "FERRITIN" in the last 72 hours. Lab Results  Component Value Date   INR 1.1 12/28/2019   INR 1.2 12/17/2018   INR 0.99 04/07/2017   Studies/Results: No results found.  Medications:  sodium chloride     ceFEPime (MAXIPIME) IV 2 g (08/09/21 1711)   methocarbamol (ROBAXIN) IV      sodium chloride   Intravenous Once   amitriptyline  25 mg Oral QHS   amLODipine  5 mg Oral Daily   atorvastatin  40 mg Oral q1800   carvedilol  6.25 mg Oral BID   Chlorhexidine Gluconate Cloth  6 each Topical Q0600   docusate sodium  100 mg Oral BID   DULoxetine  60 mg Oral Daily   insulin aspart  0-6 Units Subcutaneous Q4H   metroNIDAZOLE  500 mg Oral Q12H   pantoprazole  40 mg Oral Daily    Dialysis Orders: [facility]-MWF 4hrs; BFR 400; DFR 500; 2K/2Ca; EDW 83kg  Assessment/Plan: 1.  Left hip infection with hardware involvement and recent left femoral condyle fracture: S/p L hip nail removal, partial resection of L femur for osteo, and resection of infected soft tissue-sent for cultures-performed by Dr [name] today. On antimicrobial coverage with meropenem, metronidazole, and cefepime. 2.  End-stage renal disease: On HD MWF-HD 6/12. 3.  Hypertension: Bps currently stable but not reaching EDW. Plan to push UF as tolerated with HD.  4.  Anemia: Hgb dropping-now 5.8. Likely secondary to post-op procedure. 1unit PRBC already ordered by primary who also plans to re-check CBC. More likely will need another unit which is okay. Checking labs in AM. ESA received on 6/8. Continue to monitor H/H  trend for additional PRBC requirements. 5.  Chronic kidney disease-metabolic bone disease: Continue renal diet with phosphorus restriction.  Phosphorus level currently at goal with mildly elevated corrected calcium.  Tobie Poet, NP [facility] 08/11/2021,9:33 AM  LOS: 4 days

## 2021-08-11 NOTE — Progress Notes (Signed)
ID PROGRESS NOTE  OR cultures growing staph aureus -with sensitivities pending  Currently on cefepime and oral metronidazole, will recommend to start vancomycin with HD until sensitivities are back to finalize abtx recs.  Elzie Rings [place] [facility] [phone]

## 2021-08-11 NOTE — Progress Notes (Signed)
   Subjective:  Patient reports pain as well controlled. Pressure dressing applied overnight.   Objective:   VITALS:   Vitals:   08/10/21 1030 08/10/21 1630 08/10/21 1947 08/11/21 0325  BP: (!) 128/54 133/61 (!) 117/59 105/65  Pulse: 68 65 65 (!) 58  Resp: 18 18 18 18   Temp: 98 F (36.7 C) 98.1 F (36.7 C) 98 F (36.7 C)   TempSrc: Oral Oral Oral   SpO2: 96% 94% 95% 97%  Weight:      Height:        Neurologically intact Neurovascular intact Sensation intact distally Intact pulses distally  Pressing dressing is CDI  Lab Results  Component Value Date   WBC 7.2 08/11/2021   HGB 5.8 (LL) 08/11/2021   HCT 18.3 (L) 08/11/2021   MCV 93.4 08/11/2021   PLT 245 08/11/2021     Assessment/Plan:  1 Day Post-Op sp proximal resection of femur and ROH, doing well   - Expected postop acute blood loss anemia - agree with transfusion - Patient to work with PT/OT to optimize mobilization safely - Appreciated ID recs for antiobiotics and medical management    Tayonna Bacha 08/11/2021, 7:31 AM

## 2021-08-11 NOTE — Progress Notes (Signed)
Received a critical lab value for this patient, hemoglobin of 5.8. MD, Judd Gaudier, made aware.   Patient surgical incision has been oozing and dressing changes were made x 2. MD, Gala Romney MD, Vanetta Mulders were made aware.  One unit of PRBC was ordered. Patient blood consent was signed and patient was made aware of the situation and educated on receiving the transfusion.

## 2021-08-12 ENCOUNTER — Telehealth: Payer: Self-pay

## 2021-08-12 ENCOUNTER — Encounter (HOSPITAL_COMMUNITY): Payer: Medicare Other

## 2021-08-12 DIAGNOSIS — I10 Essential (primary) hypertension: Secondary | ICD-10-CM | POA: Diagnosis not present

## 2021-08-12 DIAGNOSIS — M009 Pyogenic arthritis, unspecified: Secondary | ICD-10-CM | POA: Diagnosis not present

## 2021-08-12 DIAGNOSIS — T847XXS Infection and inflammatory reaction due to other internal orthopedic prosthetic devices, implants and grafts, sequela: Secondary | ICD-10-CM | POA: Diagnosis not present

## 2021-08-12 DIAGNOSIS — M00059 Staphylococcal arthritis, unspecified hip: Secondary | ICD-10-CM | POA: Diagnosis not present

## 2021-08-12 DIAGNOSIS — T82898A Other specified complication of vascular prosthetic devices, implants and grafts, initial encounter: Secondary | ICD-10-CM

## 2021-08-12 DIAGNOSIS — T847XXD Infection and inflammatory reaction due to other internal orthopedic prosthetic devices, implants and grafts, subsequent encounter: Secondary | ICD-10-CM | POA: Diagnosis not present

## 2021-08-12 DIAGNOSIS — S72492A Other fracture of lower end of left femur, initial encounter for closed fracture: Secondary | ICD-10-CM | POA: Diagnosis not present

## 2021-08-12 DIAGNOSIS — N186 End stage renal disease: Secondary | ICD-10-CM | POA: Diagnosis not present

## 2021-08-12 DIAGNOSIS — N185 Chronic kidney disease, stage 5: Secondary | ICD-10-CM

## 2021-08-12 DIAGNOSIS — M86252 Subacute osteomyelitis, left femur: Secondary | ICD-10-CM | POA: Diagnosis not present

## 2021-08-12 LAB — RENAL FUNCTION PANEL
Albumin: 2.2 g/dL — ABNORMAL LOW (ref 3.5–5.0)
Anion gap: 9 (ref 5–15)
BUN: 45 mg/dL — ABNORMAL HIGH (ref 6–20)
CO2: 25 mmol/L (ref 22–32)
Calcium: 7.7 mg/dL — ABNORMAL LOW (ref 8.9–10.3)
Chloride: 85 mmol/L — ABNORMAL LOW (ref 98–111)
Creatinine, Ser: 5.33 mg/dL — ABNORMAL HIGH (ref 0.44–1.00)
GFR, Estimated: 9 mL/min — ABNORMAL LOW (ref >60)
Glucose, Bld: 134 mg/dL — ABNORMAL HIGH (ref 70–99)
Phosphorus: 5.6 mg/dL — ABNORMAL HIGH (ref 2.5–4.6)
Potassium: 4.6 mmol/L (ref 3.5–5.1)
Sodium: 119 mmol/L — CL (ref 135–145)

## 2021-08-12 LAB — CBC
HCT: 22.5 % — ABNORMAL LOW (ref 36.0–46.0)
Hemoglobin: 7.5 g/dL — ABNORMAL LOW (ref 12.0–15.0)
MCH: 29.8 pg (ref 26.0–34.0)
MCHC: 33.3 g/dL (ref 30.0–36.0)
MCV: 89.3 fL (ref 80.0–100.0)
Platelets: 223 10*3/uL (ref 150–400)
RBC: 2.52 MIL/uL — ABNORMAL LOW (ref 3.87–5.11)
RDW: 14.8 % (ref 11.5–15.5)
WBC: 8.2 10*3/uL (ref 4.0–10.5)
nRBC: 0 % (ref 0.0–0.2)

## 2021-08-12 LAB — GLUCOSE, CAPILLARY
Glucose-Capillary: 138 mg/dL — ABNORMAL HIGH (ref 70–99)
Glucose-Capillary: 141 mg/dL — ABNORMAL HIGH (ref 70–99)
Glucose-Capillary: 147 mg/dL — ABNORMAL HIGH (ref 70–99)
Glucose-Capillary: 186 mg/dL — ABNORMAL HIGH (ref 70–99)
Glucose-Capillary: 188 mg/dL — ABNORMAL HIGH (ref 70–99)

## 2021-08-12 LAB — HEMOGLOBIN AND HEMATOCRIT, BLOOD
HCT: 20.6 % — ABNORMAL LOW (ref 36.0–46.0)
Hemoglobin: 7.1 g/dL — ABNORMAL LOW (ref 12.0–15.0)

## 2021-08-12 NOTE — Progress Notes (Signed)
PT Cancellation Note  Patient Details Name: AISSATA WILMORE MRN: [id number] DOB: [birth year]   Cancelled Treatment:    Reason Eval/Treat Not Completed: Patient at procedure or test/unavailable (Pt in HD.  Will check back as time allows.)   Alvira Philips 08/12/2021, 10:27 AM Santresa Levett M,PT Acute Rehab Services [phone]

## 2021-08-12 NOTE — Consult Note (Addendum)
Hospital Consult    Reason for Consult:  malfunctioning fistula Requesting Physician:  Theda Sers, [place] MRN #:  [id number]  History of Present Illness: This is a 56 y.o. female with ESRD who presented to the hospital with a chronic left hip infection/hardware involvement and superficial left hip abscess after previously declining intervention.   She was taken to the OR on 08/10/2021 and underwent left hip removal affixes nail, partial resection of the femur for osteomyelitis by Dr. [name].  Cultures from the OR are growing staph aureus.  Pt currently on abx and followed by ID.   Pt has ESRD and underwent left BC AVF creation on 01/09/2020 by Dr. Stanford Breed.  She was last seen in our office on 08/02/2020 and at that time, her duplex revealed that the fistula had matured nicely but was difficult to palpate proximally.  She was to be scheduled for superficialization but I do not see where that has happened.   Pt states that the fistula has been working.  The dialysis nurse reports trouble cannulating and the machine is running at lower rates.    Pt is right hand dominant.  Dialysis in [place].   She has hx of left BKA. PMH significant forESRD,  HTN, DM, neuropathy, GERD, depression, obesity.   The pt is on a statin for cholesterol management.  The pt is not on a daily aspirin.   Other AC:  none The pt is on CCB, BB for hypertension.   The pt is diabetic.   Tobacco hx:  current  Past Medical History:  Diagnosis Date   Anxiety 2002   Chest tightness    Chronic kidney disease    Depression 2001   Diabetes type 1, uncontrolled    at age 75   Diabetic neuropathy ([place])    Essential hypertension 2015   GERD (gastroesophageal reflux disease)    about age of 44   Headache    Nausea and vomiting in adult    Stroke [facility])    Urinary frequency    Yeast vaginitis     Past Surgical History:  Procedure Laterality Date   ACHILLES TENDON SURGERY Left 03/10/2013   Procedure: LEFT CHOPART AMPUTATION/ LEFT  TENDON ACHILLES [place];  Surgeon: Wylene Simmer, MD;  Location: [facility];  Service: Orthopedics;  Laterality: Left;   AMPUTATION Left 06/15/2012   Procedure: AMPUTATION DIGIT;  Surgeon: Meredith Pel, MD;  Location: [facility];  Service: Orthopedics;  Laterality: Left;  Left great toe revision amputation   AMPUTATION Left 01/12/2020   Procedure: AMPUTATION BELOW KNEE;  Surgeon: Wylene Simmer, MD;  Location: [facility];  Service: Orthopedics;  Laterality: Left;   AV FISTULA PLACEMENT Left 01/09/2020   Procedure: LEFT UPPER ARM ARTERIOVENOUS (AV) FISTULA CREATION;  Surgeon: Cherre Robins, MD;  Location: [facility];  Service: Vascular;  Laterality: Left;   ENDOMETRIAL ABLATION     HARDWARE REMOVAL Left 08/10/2021   Procedure: LEFT HIP REMOVAL NAIL;  Surgeon: Newt Minion, MD;  Location: [facility];  Service: Orthopedics;  Laterality: Left;   I & D EXTREMITY Left 01/19/2020   Procedure: IRRIGATION AND DEBRIDEMENT Left Hip;  Surgeon: Rod Can, MD;  Location: [facility];  Service: Orthopedics;  Laterality: Left;   INSERTION OF DIALYSIS CATHETER Right 01/09/2020   Procedure: INSERTION OF DIALYSIS CATHETER;  Surgeon: Cherre Robins, MD;  Location: [facility];  Service: Vascular;  Laterality: Right;   INTRAMEDULLARY (IM) NAIL INTERTROCHANTERIC Left 12/29/2019   Procedure: INTRAMEDULLARY (IM) NAIL INTERTROCHANTRIC;  Surgeon: Lyla Glassing,  Aaron Edelman, MD;  Location: [facility];  Service: Orthopedics;  Laterality: Left;   IR ANGIOGRAM EXTREMITY LEFT  12/17/2018   IR FEM POP ART ATHERECT INC PTA MOD SED  12/17/2018   IR RADIOLOGIST EVAL & MGMT  11/30/2018   IR RADIOLOGIST EVAL & MGMT  12/09/2018   IR RADIOLOGIST EVAL & MGMT  02/08/2019   IR US GUIDE VASC ACCESS RIGHT  12/17/2018   LAPAROSCOPIC CHOLECYSTECTOMY  01/30/2015   [facility]     Allergies  Allergen Reactions   Trazodone Swelling   Latex Rash   Lidocaine Itching    Prior to Admission medications   Medication Sig Start Date End Date Taking? Authorizing Provider   acetaminophen (TYLENOL) 500 MG tablet Take 1,000 mg by mouth every 6 (six) hours as needed for mild pain.   Yes [provider]  alosetron (LOTRONEX) 1 MG tablet Take 1 mg by mouth 2 (two) times daily. 12/19/19  Yes [provider]  amitriptyline (ELAVIL) 25 MG tablet Take 25 mg by mouth at bedtime. 07/02/21  Yes [provider]  amLODipine (NORVASC) 5 MG tablet Take 1 tablet (5 mg total) by mouth daily. 02/21/20  Yes Love, Ivan Anchors, PA-C  atorvastatin (LIPITOR) 40 MG tablet TAKE 1 TABLET (40 MG TOTAL) BY MOUTH DAILY AT 6 PM. 02/22/19  Yes Fulp, Cammie, MD  Biotin 10 MG CAPS Take 1 capsule by mouth daily. 06/05/21  Yes [provider]  carvedilol (COREG) 6.25 MG tablet Take 1 tablet (6.25 mg total) by mouth 2 (two) times daily. 02/21/20  Yes Love, Ivan Anchors, PA-C  Cinnamon 500 MG capsule Take 500 mg by mouth 2 (two) times daily.   Yes [provider]  cyclobenzaprine (FLEXERIL) 10 MG tablet Take 10 mg by mouth 3 (three) times daily as needed for muscle spasms. 07/28/20  Yes [provider]  D3-50 1.25 MG (50000 UT) capsule Take 50,000 Units by mouth as directed. Monday, wed, Friday 05/31/20  Yes [provider]  dexlansoprazole (DEXILANT) 60 MG capsule Take 60 mg by mouth daily.   Yes [provider]  diphenoxylate-atropine (LOMOTIL) 2.5-0.025 MG tablet Take 1 tablet by mouth every 12 (twelve) hours as needed for diarrhea or loose stools.   Yes [provider]  DULoxetine (CYMBALTA) 60 MG capsule Take 60 mg by mouth daily. 07/17/20  Yes [provider]  hydrALAZINE (APRESOLINE) 100 MG tablet Take 1 tablet (100 mg total) by mouth 3 (three) times daily. 02/21/20  Yes Love, Ivan Anchors, PA-C  insulin aspart (NOVOLOG) 100 UNIT/ML injection Inject 6 Units into the skin 3 (three) times daily with meals. 08/05/21  Yes Lorella Nimrod, MD  insulin glargine (LANTUS) 100 UNIT/ML injection Inject 0.12 mLs (12 Units total) into the skin  at bedtime. 02/21/20  Yes Love, Ivan Anchors, PA-C  Menthol-Methyl Salicylate (MUSCLE RUB) 10-15 % CREA Apply 1 application topically as needed for muscle pain. 02/02/20  Yes Love, Ivan Anchors, PA-C  naloxone [facility]) nasal spray 4 mg/0.1 mL 1 spray as directed. 04/27/20  Yes [provider]  nicotine (NICODERM CQ - DOSED IN MG/24 HOURS) 21 mg/24hr patch Place 1 patch (21 mg total) onto the skin daily as needed (nicotine craving). 07/25/21  Yes Emeterio Reeve, DO  oxyCODONE-acetaminophen (PERCOCET) 7.5-325 MG tablet Take 1 tablet by mouth every 6 (six) hours as needed for moderate pain or severe pain. 08/05/21  Yes Lorella Nimrod, MD  polyethylene glycol (MIRALAX / GLYCOLAX) 17 g packet Take 17 g by mouth  daily as needed for mild constipation. 07/25/21  Yes Emeterio Reeve, DO  sevelamer carbonate (RENVELA) 800 MG tablet Take 2 tablets (1,600 mg total) by mouth 3 (three) times daily with meals. 02/21/20  Yes Love, Ivan Anchors, PA-C  sodium chloride (OCEAN) 0.65 % SOLN nasal spray Place 1 spray into both nostrils as needed for congestion (nose irritation). 02/02/20  Yes Love, Ivan Anchors, PA-C  insulin aspart (NOVOLOG) 100 UNIT/ML injection Inject 0-15 Units into the skin 3 (three) times daily with meals. 08/05/21   Lorella Nimrod, MD  insulin aspart (NOVOLOG) 100 UNIT/ML injection Inject 0-5 Units into the skin at bedtime. 08/05/21   Lorella Nimrod, MD  iron sucrose in sodium chloride 0.9 % 100 mL Iron Sucrose (Venofer) 05/21/20   [provider]  meropenem 500 mg in sodium chloride 0.9 % 100 mL Inject 500 mg into the vein daily. 08/05/21   Lorella Nimrod, MD  pantoprazole (PROTONIX) 40 MG tablet Take 1 tablet (40 mg total) by mouth daily. 07/26/21   Emeterio Reeve, DO    Social History   Socioeconomic History   Marital status: Divorced    Spouse name: Not on file   Number of children: 1   Years of education: Not on file   Highest education level: Not on file  Occupational History    Employer:  [employer]  Tobacco Use   Smoking status: Light Smoker    Packs/day: 0.25    Years: 22.00    Total pack years: 5.50    Types: Cigarettes   Smokeless tobacco: Never  Vaping Use   Vaping Use: Never used  Substance and Sexual Activity   Alcohol use: Yes    Alcohol/week: 0.0 standard drinks of alcohol    Comment: sociial   Drug use: No   Sexual activity: Yes    Partners: Male  Other Topics Concern   Not on file  Social History Narrative   Lives at home with her daughter   Right handed   Caffeine: 3 cups daily   Social Determinants of Health   Financial Resource Strain: Not on file  Food Insecurity: Not on file  Transportation Needs: Not on file  Physical Activity: Not on file  Stress: Not on file  Social Connections: Not on file  Intimate Partner Violence: Not on file    Family History  Problem Relation Age of Onset   Diabetes Mother    Heart attack Mother    Heart disease Mother        before age 28   Hypertension Mother    Hyperlipidemia Mother    Diabetes Father    Heart attack Father    Heart disease Father        before age 32   Diabetes Sister    Hypertension Sister     ROS: [x]  Positive   [ ]  Negative   [ ]  All sytems reviewed and are negative  Cardiac: [x]  HTN   Vascular: []  pain in legs while walking []  pain in legs at rest []  pain in legs at night []  non-healing ulcers []  hx of DVT []  swelling in legs  Pulmonary: []  asthma/wheezing []  home O2  Neurologic: []  hx of CVA []  mini stroke   Hematologic: []  anemia  Endocrine:   [x]  diabetes []  thyroid disease  GI [x]  GERD  GU: [x]  CKD/renal failure [x]  HD--[x]  M/W/F or []  T/T/S  Psychiatric: [x]  anxiety [x]  depression  Musculoskeletal: [x]  left hip septic arthritis/osteo  Integumentary: []  rashes []  ulcers  Constitutional: []  fever  []  chills  Physical Examination  Vitals:   08/12/21 1300 08/12/21 1330  BP: (!) 122/55 (!) 112/51  Pulse: (!) 57 (!) 58  Resp: 12 11  Temp:     SpO2: 98% 99%   Body mass index is 32.83 kg/m.  General:  WDWN in NAD Gait: Not observed HENT: WNL, normocephalic Pulmonary: normal non-labored breathing Cardiac: regular Skin: without rashes Vascular Exam/Pulses: Easily palpable right PT pulse Faintly palpable left radial pulse Extremities: left arm fistula currently with two needles on HD Musculoskeletal: no muscle wasting or atrophy  Neurologic: A&O X 3; speech is fluent/normal Psychiatric:  The pt has Normal affect.   CBC    Component Value Date/Time   WBC 8.2 08/12/2021 0051   RBC 2.52 (L) 08/12/2021 0051   HGB 7.1 (L) 08/12/2021 1153   HGB 12.0 10/29/2018 1439   HCT 20.6 (L) 08/12/2021 1153   HCT 36.6 10/29/2018 1439   PLT 223 08/12/2021 0051   PLT 345 10/29/2018 1439   MCV 89.3 08/12/2021 0051   MCV 84 10/29/2018 1439   MCH 29.8 08/12/2021 0051   MCHC 33.3 08/12/2021 0051   RDW 14.8 08/12/2021 0051   RDW 13.4 10/29/2018 1439   LYMPHSABS 0.8 08/11/2021 0154   LYMPHSABS 2.6 10/29/2018 1439   MONOABS 0.7 08/11/2021 0154   EOSABS 0.0 08/11/2021 0154   EOSABS 0.2 10/29/2018 1439   BASOSABS 0.0 08/11/2021 0154   BASOSABS 0.1 10/29/2018 1439    BMET    Component Value Date/Time   NA 119 (LL) 08/12/2021 0051   NA 140 10/29/2018 1439   K 4.6 08/12/2021 0051   CL 85 (L) 08/12/2021 0051   CO2 25 08/12/2021 0051   GLUCOSE 134 (H) 08/12/2021 0051   BUN 45 (H) 08/12/2021 0051   BUN 43 (H) 10/29/2018 1439   CREATININE 5.33 (H) 08/12/2021 0051   CREATININE 1.92 (H) 01/10/2016 1528   CALCIUM 7.7 (L) 08/12/2021 0051   CALCIUM 8.3 (L) 01/11/2020 0139   GFRNONAA 9 (L) 08/12/2021 0051   GFRNONAA 30 (L) 01/10/2016 1528   GFRAA 13 (L) 12/17/2018 0907   GFRAA 35 (L) 01/10/2016 1528    COAGS: Lab Results  Component Value Date   INR 1.1 12/28/2019   INR 1.2 12/17/2018   INR 0.99 04/07/2017       ASSESSMENT/PLAN: This is a 56 y.o. female with ESRD with hx of left BC AVF 01/09/2020 by Dr. Stanford Breed who  presented to hospital with left hip septic arthritis/osteo.  Dialysis having difficulty cannulating the fistula and [facility] is consulted.   -there has been difficulty cannulating the fistula and it is running on lower flow rates.  She will need fistulogram to evaluate the fistula. -Dr. Donzetta Matters on call MD will be by to see pt this afternoon.    Leontine Locket, PA-C [facility] [phone]  I have independently interviewed and examined patient and agree with PA assessment and plan above.  We will plan for fistulogram tomorrow in the Cath Lab likely as the last case unless there is a cancellation and we will take her earlier.  Chael Urenda C. Donzetta Matters, MD [facility] [place] Office: [phone] Pager: [phone]

## 2021-08-12 NOTE — Progress Notes (Incomplete)
PHARMACY CONSULT NOTE FOR:  OUTPATIENT  PARENTERAL ANTIBIOTIC THERAPY (OPAT)  Indication: L-hip PJI Regimen:  End date: 09/21/21  IV antibiotic discharge orders are pended. To discharging provider:  please sign these orders via discharge navigator,  Select New Orders & click on the button choice - Manage This Unsigned Work.     Thank you for allowing pharmacy to be a part of this patient's care.  Lawson Radar 08/12/2021, 2:49 PM

## 2021-08-12 NOTE — Plan of Care (Signed)

## 2021-08-12 NOTE — Telephone Encounter (Signed)
Patient needs a Hospital follow up before 7/22 with Dr. Baxter Flattery or [facility], patient is requesting a call back to schedule appointment

## 2021-08-12 NOTE — Progress Notes (Signed)
Unit of blood completed at 2115. No adverse reactions. Vital signs collected. Patient resting. Will continue to monitor.

## 2021-08-12 NOTE — Progress Notes (Addendum)
[name] Progress Note   Subjective:   Seen in room this AM. Reports localized swelling on her abdomen. Denies SOB, CP, palpitations, dizziness. Does not think her weights here are correct.  Later, HD reported difficulty cannulating AVF and could not achieve BFR > 200.   Objective Vitals:   08/12/21 1406 08/12/21 1430 08/12/21 1530 08/12/21 1600  BP: (!) 126/55 119/80 (!) 141/53 (!) 134/58  Pulse: 60 64 65 66  Resp: 14 11 11 13   Temp:      TempSrc:      SpO2: 100% 98% 100% 100%  Weight:      Height:       Physical Exam General: Awake and alert; NAD Heart: S1 and S2; No murmurs, gallops, or rubs Lungs: Clear anteriorly and laterally Abdomen: Soft and non-tender, + edema R side of abdomen Extremities: S/p L BKA; no RLE edema Dialysis Access: L AVF (+) B/T   Additional Objective Labs: Basic Metabolic Panel: Recent Labs  Lab 08/10/21 1237 08/11/21 0154 08/12/21 0051  NA 126* 123* 119*  K 4.7 5.1 4.6  CL 90* 89* 85*  CO2 25 25 25   GLUCOSE 261* 202* 134*  BUN 31* 37* 45*  CREATININE 3.98* 4.66* 5.33*  CALCIUM 8.0* 8.1* 7.7*  PHOS 5.4* 5.1* 5.6*   Liver Function Tests: Recent Labs  Lab 08/10/21 1237 08/11/21 0154 08/12/21 0051  ALBUMIN 2.0* 2.1* 2.2*   No results for input(s): "LIPASE", "AMYLASE" in the last 168 hours. CBC: Recent Labs  Lab 08/09/21 0500 08/10/21 0214 08/10/21 1237 08/11/21 0154 08/11/21 1453 08/12/21 0051 08/12/21 1153  WBC 8.0 8.6 14.6* 7.2  --  8.2  --   NEUTROABS  --   --   --  5.5  --   --   --   HGB 8.0* 7.9* 7.0* 5.8* 6.8* 7.5* 7.1*  HCT 24.9* 25.5* 22.1* 18.3* 20.5* 22.5* 20.6*  MCV 94.0 95.5 94.4 93.4  --  89.3  --   PLT 212 222 265 245  --  223  --    Blood Culture    Component Value Date/Time   SDES TISSUE 08/10/2021 0822   SPECREQUEST LEFT HIP WOUND 08/10/2021 0822   CULT  08/10/2021 0822    RARE STAPHYLOCOCCUS AUREUS CRITICAL RESULT CALLED TO, READ BACK BY AND VERIFIED WITH: RN [name] [id number]  [id number] FCP NO ANAEROBES ISOLATED; CULTURE IN PROGRESS FOR 5 DAYS SUSCEPTIBILITIES TO FOLLOW Performed at [facility], [place] [address], [place], [place] [address]    REPTSTATUS PENDING 08/10/2021 4174    Cardiac Enzymes: No results for input(s): "CKTOTAL", "CKMB", "CKMBINDEX", "TROPONINI" in the last 168 hours. CBG: Recent Labs  Lab 08/11/21 1546 08/11/21 2040 08/12/21 0028 08/12/21 0421 08/12/21 0738  GLUCAP 171* 178* 141* 138* 147*   Iron Studies:  Recent Labs    08/11/21 1453  IRON 173*  TIBC 206*  FERRITIN 983*   @lablastinr3 @ Studies/Results: No results found. Medications:  sodium chloride 10 mL/hr at 08/11/21 0945   ceFEPime (MAXIPIME) IV 2 g (08/09/21 1711)   methocarbamol (ROBAXIN) IV     vancomycin 200 mL/hr at 08/12/21 1525    sodium chloride   Intravenous Once   sodium chloride   Intravenous Once   amitriptyline  25 mg Oral QHS   amLODipine  5 mg Oral Daily   atorvastatin  40 mg Oral q1800   carvedilol  6.25 mg Oral BID   Chlorhexidine Gluconate Cloth  6 each Topical Q0600   docusate sodium  100 mg Oral BID   DULoxetine  60 mg Oral Daily   insulin aspart  0-6 Units Subcutaneous Q4H   metroNIDAZOLE  500 mg Oral Q12H   pantoprazole  40 mg Oral Daily    Dialysis Orders: [facility]-MWF 4hrs; BFR 400; DFR 500; 2K/2Ca; EDW 83kg  Assessment/Plan: 1. .  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 On antimicrobial coverage with meropenem, metronidazole, and cefepime. 2.  End-stage renal disease: On HD MWF-HD 6/12. 3.  Hypertension: Bps currently stable but not reaching EDW. ? If weights are accurate, increased UF goals and continue to monitor weight trend 4.  Anemia: Hgb dropping- 5.8 yesterday, improved s/p PRBC. Likely secondary to post-op procedure.  ESA  received on 6/8. 5.  Chronic kidney disease-metabolic bone disease: Continue renal diet with phosphorus restriction.  Phosphorus and corrected calcium levels controlled. 6. AVF issues: RN reports difficulty cannulating, high arterial pressures and unable to reach BFR >200. Consulted VVS for fistulogram, appreciate VVS assistance  Anice Paganini, PA-C 08/12/2021, 4:20 PM  [facility] Pager: [phone]  Pt seen, examined and agree w A/P as above.  [name]  MD 08/12/2021, 5:04 PM

## 2021-08-12 NOTE — Care Management Important Message (Signed)
Important Message  Patient Details  Name: [name] MRN: [id number] Date of Birth: 1965/07/30   Medicare Important Message Given:  Yes     Orbie Pyo 08/12/2021, 4:11 PM

## 2021-08-12 NOTE — Progress Notes (Signed)
Received a critical lab value for this patient's sodium level - 119. Bernadette Hoit, DO made aware. No new orders placed at this time. Will continue to monitor patient.

## 2021-08-12 NOTE — Telephone Encounter (Signed)
-----   Message from Carlyle Basques, MD sent at 08/11/2021  9:53 AM EDT ----- Can I see her back just before 7/22(or greg can also see her) hospital follow up for osteo

## 2021-08-12 NOTE — Plan of Care (Signed)
  Problem: Education: Goal: Ability to describe self-care measures that may prevent or decrease complications (Diabetes Survival Skills Education) will improve Outcome: Not Progressing Goal: Individualized Educational Video(s) Outcome: Not Progressing   Problem: Coping: Goal: Ability to adjust to condition or change in health will improve Outcome: Not Progressing   Problem: Fluid Volume: Goal: Ability to maintain a balanced intake and output will improve Outcome: Not Progressing   Problem: Metabolic: Goal: Ability to maintain appropriate glucose levels will improve Outcome: Not Progressing   Problem: Nutritional: Goal: Maintenance of adequate nutrition will improve Outcome: Not Progressing Goal: Progress toward achieving an optimal weight will improve Outcome: Not Progressing   Problem: Tissue Perfusion: Goal: Adequacy of tissue perfusion will improve Outcome: Not Progressing   Problem: Education: Goal: Knowledge of General Education information will improve Description: Including pain rating scale, medication(s)/side effects and non-pharmacologic comfort measures Outcome: Not Progressing

## 2021-08-12 NOTE — Progress Notes (Signed)
[facility]    Date of Admission:  08/07/2021     ID: [name] is a 56 y.o. female with  left hip osteo/femur, hw removal. Principal Problem:   Septic arthritis of hip ([place]) Active Problems:   Type 2 diabetes mellitus with renal complication (HCC)   ESRD (end stage renal disease) on dialysis [facility])   Essential hypertension   Closed fracture of left distal femur (HCC)   Skin abscess L hip   Hardware complicating wound infection ([place])   Subacute osteomyelitis of left femur (HCC)    Subjective: Afebrile. Tolerating hd without issues. Some pain to left hip from surgery  Ros= 12 point ros is otherwise negative  Medications:   sodium chloride   Intravenous Once   sodium chloride   Intravenous Once   amitriptyline  25 mg Oral QHS   amLODipine  5 mg Oral Daily   atorvastatin  40 mg Oral q1800   carvedilol  6.25 mg Oral BID   Chlorhexidine Gluconate Cloth  6 each Topical Q0600   docusate sodium  100 mg Oral BID   DULoxetine  60 mg Oral Daily   insulin aspart  0-6 Units Subcutaneous Q4H   metroNIDAZOLE  500 mg Oral Q12H   pantoprazole  40 mg Oral Daily    Objective: Vital signs in last 24 hours: Temp:  [97.9 F (36.6 C)-98.9 F (37.2 C)] 98.7 F (37.1 C) (06/12 1048) Pulse Rate:  [57-67] 64 (06/12 1430) Resp:  [10-19] 11 (06/12 1430) BP: (109-148)/(50-80) 119/80 (06/12 1430) SpO2:  [97 %-100 %] 98 % (06/12 1430) Weight:  [103.8 kg] 103.8 kg (06/12 1036)  Physical Exam  Constitutional:  oriented to person, place, and time. appears well-developed and well-nourished. No distress.  HENT: [place]/AT, PERRLA, no scleral icterus Mouth/Throat: Oropharynx is clear and moist. No oropharyngeal exudate.  Cardiovascular: Normal rate, regular rhythm and normal heart sounds. Exam reveals no gallop and no friction rub.  No murmur heard.  Pulmonary/Chest: Effort normal and breath sounds normal. No respiratory distress.  has no wheezes.  Neck = supple, no  nuchal rigidity Ext: left arm fistula non tender Ext - left leg bka/wrapped  Psychiatric: a normal mood and affect.  behavior is normal.    Lab Results Recent Labs    08/11/21 0154 08/11/21 1453 08/12/21 0051 08/12/21 1153  WBC 7.2  --  8.2  --   HGB 5.8*   < > 7.5* 7.1*  HCT 18.3*   < > 22.5* 20.6*  NA 123*  --  119*  --   K 5.1  --  4.6  --   CL 89*  --  85*  --   CO2 25  --  25  --   BUN 37*  --  45*  --   CREATININE 4.66*  --  5.33*  --    < > = values in this interval not displayed.   Liver Panel Recent Labs    08/11/21 0154 08/12/21 0051  ALBUMIN 2.1* 2.2*   Sedimentation Rate No results for input(s): "ESRSEDRATE" in the last 72 hours. C-Reactive Protein No results for input(s): "CRP" in the last 72 hours.  Microbiology: reviewed Studies/Results: No results found. Lab Results  Component Value Date   ESRSEDRATE 86 (H) 08/02/2021     Assessment/Plan: Polymicrobial left hip osteo and thigh abscess s/p debridement and HW(nail_) removal = continue on cefepime and vancomycin with HD, plus oral metronidazole 500mg  po bid  Will follow  micro cultures for sensitivities to see if still needs vancomycin. Plan for 6 wk of IV therapy and follow up in clinic to see if needs oral suppression.  [facility] Pager: [phone]  08/12/2021, 3:29 PM

## 2021-08-12 NOTE — Evaluation (Signed)
Occupational Therapy Evaluation Patient Details Name: [name] MRN: [id number] DOB: 19-Sep-1965 Today's Date: 08/12/2021   History of Present Illness Pt is a 56 y/o F admitted on 07/21/21 after presenting to ED for c/c of pain from falling during transfer from w/c to recliner ~1 week ago & pt missed dialysis for a whole week. Pt found to have distal L femur fx & deemed no surgery by orthopedics but NWB LLE Pt transferred to [facility] on 6/9 and underwent removal of infected hardware of L hip on 6/10. PMH: HTN, HLD, ESRD on HD MWF, L BKA in 2021, depression, anxiety, insulin-dependent DM, insomnia, chronic pain syndromes   Clinical Impression   PTA, pt lives with daughter, typically Modified Independent-Setup Assist for ADLs and able to complete transfers to/from wheelchair without assist. Pt presents now with deficits in pain (declined pain meds citing they will just be flushed out of her system at HD today) and hesitant for EOB/OOB attempts. Pt able to demo good UB strength to assist with ADLs/bed mobility though likely requires extensive assist for LB ADLs due to pain. Educated re: importance of L residual limb ROM in prep for future prosthetic use (resting in flexion on entry) and need for therapy participation to regain independence. Pt reports at [facility], plan was for SNF rehab at [place] and agree with this plan pending progress with acute rehab. Will continue to follow acutely.      Recommendations for follow up therapy are one component of a multi-disciplinary discharge planning process, led by the attending physician.  Recommendations may be updated based on patient status, additional functional criteria and insurance authorization.   Follow Up Recommendations  Skilled nursing-short term rehab (<3 hours/day)    Assistance Recommended at Discharge Intermittent Supervision/Assistance  Patient can return home with the following A lot of help with walking and/or transfers;A lot of help with  bathing/dressing/bathroom;Help with stairs or ramp for entrance;Assistance with cooking/housework    Functional Status Assessment  Patient has had a recent decline in their functional status and demonstrates the ability to make significant improvements in function in a reasonable and predictable amount of time.  Equipment Recommendations  None recommended by OT    Recommendations for Other Services       Precautions / Restrictions Precautions Precautions: Fall Restrictions Weight Bearing Restrictions: Yes LLE Weight Bearing: Non weight bearing      Mobility Bed Mobility Overal bed mobility: Needs Assistance             General bed mobility comments: able to pull self to long sitting using bedrails and Min A. declined to sit EOB    Transfers                   General transfer comment: declined      Balance Overall balance assessment:  (to be further assessed)                                         ADL either performed or assessed with clinical judgement   ADL Overall ADL's : Needs assistance/impaired Eating/Feeding: Set up;Bed level   Grooming: Set up;Bed level   Upper Body Bathing: Set up;Bed level   Lower Body Bathing: Moderate assistance;Bed level   Upper Body Dressing : Set up;Bed level   Lower Body Dressing: Maximal assistance;Bed level       Toileting- Clothing Manipulation and Hygiene: Maximal  assistance;Bed level         General ADL Comments: Due to hesitancy to move L LE or attempt EOB in pain anticipation, feel pt will require extensive assist for LB ADLs. Noted to have residual limb in flexion - educated on need for ROM and encourage extension to be able to utilize prosthetic in the future.     Vision Ability to See in Adequate Light: 0 Adequate Patient Visual Report: No change from baseline Vision Assessment?: No apparent visual deficits     Perception     Praxis      Pertinent Vitals/Pain Pain  Assessment Pain Assessment: Faces Faces Pain Scale: Hurts little more Pain Location: L hip at rest (would not move it) Pain Descriptors / Indicators: Discomfort, Sore Pain Intervention(s): Monitored during session, Patient requesting pain meds-RN notified     Hand Dominance Right   Extremity/Trunk Assessment Upper Extremity Assessment Upper Extremity Assessment: Overall WFL for tasks assessed   Lower Extremity Assessment Lower Extremity Assessment: Defer to PT evaluation   Cervical / Trunk Assessment Cervical / Trunk Assessment: Normal   Communication Communication Communication: No difficulties   Cognition Arousal/Alertness: Awake/alert Behavior During Therapy: WFL for tasks assessed/performed Overall Cognitive Status: Within Functional Limits for tasks assessed                                 General Comments: A&Ox4, requires encouragement to participate, appears to have poor understanding of need to participate to regain independence     General Comments  Reports nausea, desrire for pain meds prior to moving. RN brought in nausea meds, pt declined to take any other meds reporting they will just get flushed out of her system at HD. Then declined to attempt EOB citing likelihood of causing increased pain for remainder of day.    Exercises     Shoulder Instructions      Home Living Family/patient expects to be discharged to:: Private residence Living Arrangements: Children Available Help at Discharge: Family;Available PRN/intermittently Type of Home: Mobile home Home Access: Ramped entrance     Home Layout: One level     Bathroom Shower/Tub: Occupational psychologist: Standard Bathroom Accessibility: No   Home Equipment: Wheelchair - manual;BSC/3in1;Rollator (4 wheels)          Prior Functioning/Environment Prior Level of Function : Needs assist             Mobility Comments: Pt reports she's independent with transfers without AD  (via squat pivot or lateral scoot), mod I from w/c level, has received her LLE prosthesis but hasn't been trained/received PT on how to use it yet. Daughter assists with transportation to dialysis appointments. ADLs Comments: Pt reports setup assist for sponge bathing tasks, uses of BSC as w/c does not fit in bathroom. Reports able to dress self. family assists with IADLs        OT Problem List: Decreased strength;Pain;Decreased range of motion;Decreased activity tolerance;Decreased safety awareness;Impaired balance (sitting and/or standing);Decreased knowledge of use of DME or AE;Decreased knowledge of precautions      OT Treatment/Interventions: Self-care/ADL training;Balance training;Therapeutic exercise;Energy conservation;Therapeutic activities;DME and/or AE instruction;Visual/perceptual remediation/compensation;Patient/family education    OT Goals(Current goals can be found in the care plan section) Acute Rehab OT Goals Patient Stated Goal: pain control, get rid of infection OT Goal Formulation: With patient Time For Goal Achievement: 08/26/21 Potential to Achieve Goals: Fair  OT Frequency: Min 2X/week  Co-evaluation              AM-PAC OT "6 Clicks" Daily Activity     Outcome Measure Help from another person eating meals?: None Help from another person taking care of personal grooming?: A Little Help from another person toileting, which includes using toliet, bedpan, or urinal?: A Lot Help from another person bathing (including washing, rinsing, drying)?: A Lot Help from another person to put on and taking off regular upper body clothing?: A Little Help from another person to put on and taking off regular lower body clothing?: A Lot 6 Click Score: 16   End of Session Nurse Communication: Mobility status;Patient requests pain meds  Activity Tolerance: Other (comment);Patient limited by pain (self limiting) Patient left: in bed;with call bell/phone within reach  OT  Visit Diagnosis: Muscle weakness (generalized) (M62.81);Unsteadiness on feet (R26.81);History of falling (Z91.81)                Time: 3254-9826 OT Time Calculation (min): 19 min Charges:  OT General Charges $OT Visit: 1 Visit OT Evaluation $OT Eval Moderate Complexity: 1 Mod  Malachy Chamber, OTR/L Acute Rehab Services Office: [phone]   Layla Maw 08/12/2021, 9:04 AM

## 2021-08-12 NOTE — Progress Notes (Signed)
PROGRESS NOTE    [name]  BZJ:[id number] DOB: Oct 22, 1965 DOA: 08/07/2021 PCP: Julian Hy, PA-C    Brief Narrative:   [name] is a 56 y.o. female with past medical history significant for HTN, HLD, ESRD on HD MWF, history of left BKA 2021, depression/anxiety, history of CVA, tobacco use disorder, type 2 diabetes mellitus, insulin-dependent, insomnia, chronic pain syndrome who presented to [facility] on 6/7 via transfer from [facility] for distal left femur fracture secondary to mechanical fall, deemed nonoperative per [facility] orthopedics.  During this hospital course, patient was treated for multifocal pneumonia Ona, started on antibiotics on 5/27 in which she finished a course on 6/7.  Urine culture, sputum culture, blood cultures all remain negative.  Underwent drainage of superficial abscess left hip on 5/28, culture negative.  MRI left hip concerning for 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  Orthopedics initially recommended I&D, hardware removal and placement of antibiotic beads but patient declined surgery.  Was seen by infectious disease during her hospitalization who recommended I&D but orthopedics felt infection would recur and she would need a very extensive debridement.  IR was also consulted but patient declined CT-guided drainage procedure requested surgical opinion from her primary orthopedist, Dr. Lyla Glassing who accepted the patient for transfer to [facility] under the hospital service.  Patient was initially treated with cefepime but changed to meropenem due to prior history of resistant Enterobacter and Bacteroides.  Assessment & Plan:   Left hip septic arthritis/osteomyelitis, abscess, hardware involvement Patient presenting as a transfer from [facility] after imaging studies notable for ununited left hip fracture with extensive lucency around hip hardware consistent with extensive  osteomyelitis, 6 cm posterior fluid collection concerning for abscess that appears with a sinus track to skin with probable septic arthritis, mild fasciitis, cellulitis.  Orthopedics at [facility] initially recommended I&D with hardware removal and placement of antibiotic beads but patient declined surgery.  IR was also consulted patient declined CT-guided drainage of the procedure requested surgical opinion from her primary orthopedist Dr. Lyla Glassing.  Patient was initially treated with cefepime but changed to meropenem by infectious disease on 5/30 due to prior history of resistant Enterobacter and Bacteroides.  Patient underwent hardware removal of left hip affixes nail with partial resection of tuft tissue and femur that were nonviable by Dr. [name] on 08/10/2021.  --Orthopedic/infectious disease following, appreciate assistance --Blood Cultures 5/27 x 2: No growth x 5 days --Superficial wound culture 5/30: no organism seen on Gram stain, no growth x3 days --Operative culture 6/10: Gram stain with no organism, further culture pending --Cefepime 2 g IV every Monday/Wednesday/Friday --Metronidazole 500 mg PO q12h --ID plans 6-week IV antibiotic treatment with HD on cefepime and oral metronidazole using 6/11 as day 1  Acute postoperative blood loss anemia Hemoglobin dropped from 7.0 to 5.8 this morning, some blood oozing overnight from operative site with multiple dressing changes overnight.  Pressure dressing placed.  Anemia panel with iron 173, TIBC 206, ferritin 983, B12 646, folate 14.7.  Transfused 2 unit PRBCs on 6/11. --Hgb 8.1>>5.8>6.8>7.5 --Maintain hemoglobin >/= than 7.0 --Repeat H&H 12 PM --CBC daily  Left femoral condyle fracture Evaluated by orthopedics, currently plan nonoperative management. --Nonweightbearing left lower extremity --Pain control  Skin abscess left hip Previously I&D with copious purulent fluid obtained, wound culture negative. --Continue local wound  care  Hyponatremia Sodium 121 on admission 6/7, has missed several HD sessions prior, did not receive dialysis yesterday.  Likely secondary to hypervolemic hyponatremia. --Na 121>126>130>126>123>119 --HD today  Type 2 diabetes mellitus, insulin-dependent Hemoglobin A1c 7.1 on 07/22/2021, well controlled.   --Hold long-acting insulin for now --very Sensitive SSI for coverage --CBGs qAC/HS  ESRD on HD MWF --Nephrology consulted for continued HD while inpatient  Depression/Anxiety --Amitriptyline 25 mg p.o. nightly --Duloxetine 60 mg p.o. daily  Essential hypertension --Amlodipine 5 mg p.o. daily --Carvedilol 6.25 mg p.o. twice daily --Holding home hydralazine --Continue to monitor BP closely  Obesity Body mass index is 32.83 kg/m.  Discussed with patient needs for aggressive lifestyle changes/weight loss as this complicates all facets of care.  Outpatient follow-up with PCP.      DVT prophylaxis: SCDs Start: 08/10/21 1056 SCDs Start: 08/07/21 2226    Code Status: Full Code Family Communication: No family present at bedside this morning  Disposition Plan:  Level of care: Med-Surg Status is: Inpatient Remains inpatient appropriate because: Awaiting finalized culture sensitivities, awaiting sign off from orthopedics and ID.  May need SNF placement.    Consultants:  Orthopedics, Dr. [name] Nephrology Infectious disease  Procedures:  None  Antimicrobials:  Azithromycin 5/21 - 5/30 Cefepime 5/21 - 5/28, 6/9>> Meropenem 5/30 - 6/8 Metronidazole 6/9>>   Subjective: Patient seen examined bedside, resting comfortably.  Lying in bed.  Complaining of some mild nausea this morning, otherwise no other complaints.  Hemoglobin up to 7.5 today.  Awaiting for HD today. Denies headache, no fever/chills/night sweats, no nausea/vomiting/diarrhea, no chest pain, no palpitations, no shortness of breath, no abdominal pain, no focal weakness, no fatigue, no paresthesias.  No acute  events overnight per nursing staff.  Objective: Vitals:   08/12/21 0002 08/12/21 0431 08/12/21 0742 08/12/21 1036  BP: (!) 124/57 (!) 128/50 (!) 129/57   Pulse: 63 65 67   Resp:  16 18   Temp: 98.3 F (36.8 C) 97.9 F (36.6 C) 98.5 F (36.9 C)   TempSrc: Oral Oral Oral   SpO2: 97% 99% 99%   Weight:    103.8 kg  Height:        Intake/Output Summary (Last 24 hours) at 08/12/2021 1109 Last data filed at 08/12/2021 0300 Gross per 24 hour  Intake 2037.48 ml  Output --  Net 2037.48 ml    Filed Weights   08/08/21 1454 08/09/21 0800 08/12/21 1036  Weight: 104.3 kg 99.4 kg 103.8 kg    Examination:  Physical Exam: GEN: NAD, alert and oriented x 3, chronically ill in appearance, appears older than stated age, obese HEENT: NCAT, PERRL, EOMI, sclera clear, MMM PULM: CTAB w/o wheezes/crackles, normal respiratory effort, on room air CV: RRR w/o M/G/R GI: abd soft, NTND, NABS, no R/G/M MSK: Left BKA noted, left thigh with with pressure dressing in place, clean/dry/intact, moves all extremities independently with preserved muscle strength NEURO: CN II-XII intact, no focal deficits, sensation to light touch intact PSYCH: normal mood/affect Integumentary: Left thigh wound as above, otherwise no other concerning rashes/lesions/wounds     Data Reviewed: I have personally reviewed following labs and imaging studies  CBC: Recent Labs  Lab 08/09/21 0500 08/10/21 0214 08/10/21 1237 08/11/21 0154 08/11/21 1453 08/12/21 0051  WBC 8.0 8.6 14.6* 7.2  --  8.2  NEUTROABS  --   --   --  5.5  --   --   HGB 8.0* 7.9* 7.0* 5.8* 6.8* 7.5*  HCT 24.9* 25.5* 22.1* 18.3* 20.5* 22.5*  MCV 94.0 95.5 94.4 93.4  --  89.3  PLT 212 222 265 245  --  166   Basic Metabolic Panel: Recent Labs  Lab 08/09/21 0500 08/10/21 0214 08/10/21 1237 08/11/21 0154 08/12/21 0051  NA 126* 130* 126* 123* 119*  K 4.3 4.4 4.7 5.1 4.6  CL 95* 93* 90* 89* 85*  CO2 23 28 25 25 25   GLUCOSE 137* 122* 261* 202*  134*  BUN 36* 24* 31* 37* 45*  CREATININE 4.53* 3.36* 3.98* 4.66* 5.33*  CALCIUM 7.7* 8.0* 8.0* 8.1* 7.7*  PHOS 4.6 3.9 5.4* 5.1* 5.6*   GFR: Estimated Creatinine Clearance: 15.7 mL/min (A) (by C-G formula based on SCr of 5.33 mg/dL (H)). Liver Function Tests: Recent Labs  Lab 08/09/21 0500 08/10/21 0214 08/10/21 1237 08/11/21 0154 08/12/21 0051  ALBUMIN 2.1* 2.1* 2.0* 2.1* 2.2*   No results for input(s): "LIPASE", "AMYLASE" in the last 168 hours. No results for input(s): "AMMONIA" in the last 168 hours. Coagulation Profile: No results for input(s): "INR", "PROTIME" in the last 168 hours. Cardiac Enzymes: No results for input(s): "CKTOTAL", "CKMB", "CKMBINDEX", "TROPONINI" in the last 168 hours. BNP (last 3 results) No results for input(s): "PROBNP" in the last 8760 hours. HbA1C: No results for input(s): "HGBA1C" in the last 72 hours. CBG: Recent Labs  Lab 08/11/21 1546 08/11/21 2040 08/12/21 0028 08/12/21 0421 08/12/21 0738  GLUCAP 171* 178* 141* 138* 147*   Lipid Profile: No results for input(s): "CHOL", "HDL", "LDLCALC", "TRIG", "CHOLHDL", "LDLDIRECT" in the last 72 hours. Thyroid Function Tests: No results for input(s): "TSH", "T4TOTAL", "FREET4", "T3FREE", "THYROIDAB" in the last 72 hours. Anemia Panel: Recent Labs    08/11/21 1453  VITAMINB12 646  FOLATE 14.7  FERRITIN 983*  TIBC 206*  IRON 173*  RETICCTPCT 3.2*   Sepsis Labs: No results for input(s): "PROCALCITON", "LATICACIDVEN" in the last 168 hours.  Recent Results (from the past 240 hour(s))  Surgical pcr screen     Status: None   Collection Time: 08/08/21 11:57 PM   Specimen: Nasal Mucosa; Nasal Swab  Result Value Ref Range Status   MRSA, PCR NEGATIVE NEGATIVE Final   Staphylococcus aureus NEGATIVE NEGATIVE Final    Comment: (NOTE) The Xpert SA Assay (FDA approved for NASAL specimens in patients 60 years of age and older), is one component of a comprehensive surveillance program. It is not  intended to diagnose infection nor to guide or monitor treatment. Performed at [facility], [place] [address], [place], [place] [address]   Aerobic/Anaerobic Culture w Gram Stain (surgical/deep wound)     Status: None (Preliminary result)   Collection Time: 08/10/21  8:22 AM   Specimen: Wound; Abscess  Result Value Ref Range Status   Specimen Description TISSUE  Final   Special Requests LEFT HIP WOUND  Final   Gram Stain   Final    RARE WBC PRESENT,BOTH PMN AND MONONUCLEAR NO ORGANISMS SEEN    Culture   Final    RARE STAPHYLOCOCCUS AUREUS CRITICAL RESULT CALLED TO, READ BACK BY AND VERIFIED WITH: RN [name] [id number] FCP NO ANAEROBES ISOLATED; CULTURE IN PROGRESS FOR 5 DAYS SUSCEPTIBILITIES TO FOLLOW Performed at [facility], [place] [address] [place], [place] [address]    Report Status PENDING  Incomplete         Radiology Studies: No results found.      Scheduled Meds:  sodium chloride   Intravenous Once   sodium chloride   Intravenous Once   amitriptyline  25 mg Oral QHS   amLODipine  5 mg Oral Daily   atorvastatin  40  mg Oral q1800   carvedilol  6.25 mg Oral BID   Chlorhexidine Gluconate Cloth  6 each Topical Q0600   docusate sodium  100 mg Oral BID   DULoxetine  60 mg Oral Daily   insulin aspart  0-6 Units Subcutaneous Q4H   metroNIDAZOLE  500 mg Oral Q12H   pantoprazole  40 mg Oral Daily   Continuous Infusions:  sodium chloride 10 mL/hr at 08/11/21 0945   ceFEPime (MAXIPIME) IV 2 g (08/09/21 1711)   methocarbamol (ROBAXIN) IV     vancomycin       LOS: 5 days    Time spent: 42 minutes spent on chart review, discussion with nursing staff, consultants, updating family and interview/physical exam; more than 50% of that time was spent in counseling and/or coordination of care.    Donna Wedge J [place] DO [facility] Available via Epic secure chat 7am-7pm After these hours, please refer to coverage provider listed on  [email or web address] 08/12/2021, 11:09 AM

## 2021-08-13 ENCOUNTER — Encounter (HOSPITAL_COMMUNITY): Payer: Medicare Other

## 2021-08-13 ENCOUNTER — Encounter (HOSPITAL_COMMUNITY)
Admission: AD | Disposition: A | Discharge status: Skilled Nursing Facility | Payer: Self-pay | Source: Other Acute Inpatient Hospital | Attending: Internal Medicine

## 2021-08-13 DIAGNOSIS — M00059 Staphylococcal arthritis, unspecified hip: Secondary | ICD-10-CM | POA: Diagnosis not present

## 2021-08-13 DIAGNOSIS — I10 Essential (primary) hypertension: Secondary | ICD-10-CM | POA: Diagnosis not present

## 2021-08-13 DIAGNOSIS — M009 Pyogenic arthritis, unspecified: Secondary | ICD-10-CM | POA: Diagnosis not present

## 2021-08-13 DIAGNOSIS — S72492A Other fracture of lower end of left femur, initial encounter for closed fracture: Secondary | ICD-10-CM | POA: Diagnosis not present

## 2021-08-13 DIAGNOSIS — N186 End stage renal disease: Secondary | ICD-10-CM | POA: Diagnosis not present

## 2021-08-13 LAB — GLUCOSE, CAPILLARY
Glucose-Capillary: 117 mg/dL — ABNORMAL HIGH (ref 70–99)
Glucose-Capillary: 132 mg/dL — ABNORMAL HIGH (ref 70–99)
Glucose-Capillary: 138 mg/dL — ABNORMAL HIGH (ref 70–99)
Glucose-Capillary: 161 mg/dL — ABNORMAL HIGH (ref 70–99)
Glucose-Capillary: 191 mg/dL — ABNORMAL HIGH (ref 70–99)
Glucose-Capillary: 210 mg/dL — ABNORMAL HIGH (ref 70–99)
Glucose-Capillary: 92 mg/dL (ref 70–99)

## 2021-08-13 LAB — RENAL FUNCTION PANEL
Albumin: 1.9 g/dL — ABNORMAL LOW (ref 3.5–5.0)
Anion gap: 6 (ref 5–15)
BUN: 31 mg/dL — ABNORMAL HIGH (ref 6–20)
CO2: 24 mmol/L (ref 22–32)
Calcium: 7.1 mg/dL — ABNORMAL LOW (ref 8.9–10.3)
Chloride: 93 mmol/L — ABNORMAL LOW (ref 98–111)
Creatinine, Ser: 3.8 mg/dL — ABNORMAL HIGH (ref 0.44–1.00)
GFR, Estimated: 13 mL/min — ABNORMAL LOW (ref >60)
Glucose, Bld: 179 mg/dL — ABNORMAL HIGH (ref 70–99)
Phosphorus: 4 mg/dL (ref 2.5–4.6)
Potassium: 3.9 mmol/L (ref 3.5–5.1)
Sodium: 123 mmol/L — ABNORMAL LOW (ref 135–145)

## 2021-08-13 LAB — CBC
HCT: 21.5 % — ABNORMAL LOW (ref 36.0–46.0)
Hemoglobin: 7 g/dL — ABNORMAL LOW (ref 12.0–15.0)
MCH: 29.8 pg (ref 26.0–34.0)
MCHC: 32.6 g/dL (ref 30.0–36.0)
MCV: 91.5 fL (ref 80.0–100.0)
Platelets: 185 10*3/uL (ref 150–400)
RBC: 2.35 MIL/uL — ABNORMAL LOW (ref 3.87–5.11)
RDW: 14.9 % (ref 11.5–15.5)
WBC: 7 10*3/uL (ref 4.0–10.5)
nRBC: 0.3 % — ABNORMAL HIGH (ref 0.0–0.2)

## 2021-08-13 LAB — HEPATITIS B SURFACE ANTIBODY, QUANTITATIVE: Hep B S AB Quant (Post): <3.1 m[IU]/mL — ABNORMAL LOW (ref >9.9)

## 2021-08-13 SURGERY — A/V FISTULAGRAM
Anesthesia: LOCAL

## 2021-08-13 MED ORDER — CHLORHEXIDINE GLUCONATE CLOTH 2 % EX PADS
6.0000 | MEDICATED_PAD | Freq: Every day | CUTANEOUS | Status: DC
  Administered 2021-08-14 – 2021-08-15 (×2): 6 via TOPICAL

## 2021-08-13 MED ORDER — CEFAZOLIN SODIUM-DEXTROSE 2-4 GM/100ML-% IV SOLN
2.0000 g | INTRAVENOUS | Status: DC
  Administered 2021-08-14 – 2021-08-23 (×4): 2 g via INTRAVENOUS
  Filled 2021-08-13 (×6): qty 100

## 2021-08-13 NOTE — Progress Notes (Signed)
Pharmacy Antibiotic Note  [name] is a 56 y.o. female admitted on 08/07/2021 with L-hip PJI. Pharmacy has been consulted for Cefazolin dosing.  6/10 intra-op cultures growing MSSA - will narrow antibiotics. Last HD 6/12 with Cefepime dose that evening. Will provide coverage until after the patient's next HD session - planned for 6/14.   Plan: - Start Cefazolin on MWF @ 1800 after HD - next dose due 6/14 - Will continue to follow HD schedule/duration, culture results, LOT, and antibiotic de-escalation plans   Height: 5\' 10"  (177.8 cm) Weight: 100 kg (220 lb 7.4 oz) IBW/kg (Calculated) : 68.5  Temp (24hrs), Avg:98.6 F (37 C), Min:98.2 F (36.8 C), Max:98.9 F (37.2 C)  Recent Labs  Lab 08/10/21 0214 08/10/21 1237 08/11/21 0154 08/12/21 0051 08/13/21 0136  WBC 8.6 14.6* 7.2 8.2 7.0  CREATININE 3.36* 3.98* 4.66* 5.33* 3.80*    Estimated Creatinine Clearance: 21.7 mL/min (A) (by C-G formula based on SCr of 3.8 mg/dL (H)).    Allergies  Allergen Reactions   Trazodone Swelling   Latex Rash   Lidocaine Itching    Antimicrobials this admission: Azithro 5/27 >> 5/29 Cefepime 5/27 >> 5/29 Meropenem 5/30 >> 6/8 Cefepime 6/9 >> 6/13 Metronidazole 6/9 >>  Microbiology results: 6/10 L-hip tissue cx >> MSSA  Thank you for allowing pharmacy to be a part of this patient's care.  Alycia Rossetti, PharmD, BCPS Infectious Diseases Clinical Pharmacist 08/13/2021 1:30 PM   **Pharmacist phone directory can now be found on [email or web address] ([email or web address]).  Listed under [place].

## 2021-08-13 NOTE — Progress Notes (Signed)
[facility] for Infectious Disease    Date of Admission:  08/07/2021   Total days of antibiotics 18           ID: [name] is a 56 y.o. female with   Principal Problem:   Septic arthritis of hip ([place]) Active Problems:   Type 2 diabetes mellitus with renal complication (HCC)   ESRD (end stage renal disease) on dialysis [facility])   Essential hypertension   Closed fracture of left distal femur (HCC)   Skin abscess L hip   Hardware complicating wound infection ([place])   Subacute osteomyelitis of left femur (HCC)    Subjective: Had difficulty with hemodialysis and her fistula yesterday. The patient did not want fistulagram today  Medications:   sodium chloride   Intravenous Once   sodium chloride   Intravenous Once   amitriptyline  25 mg Oral QHS   amLODipine  5 mg Oral Daily   atorvastatin  40 mg Oral q1800   carvedilol  6.25 mg Oral BID   Chlorhexidine Gluconate Cloth  6 each Topical Q0600   Chlorhexidine Gluconate Cloth  6 each Topical Q0600   docusate sodium  100 mg Oral BID   DULoxetine  60 mg Oral Daily   insulin aspart  0-6 Units Subcutaneous Q4H   metroNIDAZOLE  500 mg Oral Q12H   pantoprazole  40 mg Oral Daily    Objective: Vital signs in last 24 hours: Temp:  [98.1 F (36.7 C)-98.9 F (37.2 C)] 98.1 F (36.7 C) (06/13 1255) Pulse Rate:  [61-69] 64 (06/13 1255) Resp:  [18-20] 18 (06/13 1255) BP: (115-141)/(42-61) 136/56 (06/13 1255) SpO2:  [98 %-100 %] 98 % (06/13 1255)  Physical Exam  Constitutional:  oriented to person, place, and time. appears well-developed and well-nourished. No distress.  HENT: /AT, PERRLA, no scleral icterus Mouth/Throat: Oropharynx is clear and moist. No oropharyngeal exudate.  Cardiovascular: Normal rate, regular rhythm and normal heart sounds. Exam reveals no gallop and no friction rub.  No murmur heard.  Pulmonary/Chest: Effort normal and breath sounds normal. No respiratory distress.  has no wheezes.  Neck = supple,  no nuchal rigidity Abdominal: Soft. Bowel sounds are normal.  exhibits no distension. There is no tenderness.  Ext: fistula to left arm is not tender Skin: Skin is warm and dry. No rash noted. No erythema.  Psychiatric: a normal mood and affect.  behavior is normal.    Lab Results Recent Labs    08/12/21 0051 08/12/21 1153 08/13/21 0136  WBC 8.2  --  7.0  HGB 7.5* 7.1* 7.0*  HCT 22.5* 20.6* 21.5*  NA 119*  --  123*  K 4.6  --  3.9  CL 85*  --  93*  CO2 25  --  24  BUN 45*  --  31*  CREATININE 5.33*  --  3.80*   Liver Panel Recent Labs    08/12/21 0051 08/13/21 0136  ALBUMIN 2.2* 1.9*   Lab Results  Component Value Date   ESRSEDRATE 86 (H) 08/02/2021    Microbiology: MSSA Studies/Results: No results found.  left hip MRI IMPRESSION: 1. Ununited left hip fractures with extensive lucency around the hip hardware consistent with extensive osteomyelitis. 2. 6 cm posterior fluid collection/abscess appears to be a connecting with a sinus tract to the skin. 3. Probable septic arthritis, myofasciitis and cellulitis. 4. Associated myofasciitis and cellulitis.    Assessment/Plan: Left hip osteomyelitis = continue with cefazolin post hd x 6 wk using 6/11  as day 1 of treatment course. Will follow up with patient in clinic to see if needs chronic suppression. Hardware is removed  Defer to dr duda for surgical incision management. Will sign off.  [facility] for Infectious Diseases Pager: [phone]  08/13/2021, 5:03 PM

## 2021-08-13 NOTE — Progress Notes (Signed)
Pt receives out-pt HD at [facility] on MWF. Pt arrives at 11:55 for 12:15 chair time. Will assist as needed.   Melven Sartorius Renal Navigator [phone]

## 2021-08-13 NOTE — Progress Notes (Signed)
Patient refusing to have fistulogram done.  She would like to try and utilize her fistula tomorrow.  We will sign off.  Please reconsult if there are additional issues.  Annamarie Major

## 2021-08-13 NOTE — Plan of Care (Signed)
  Problem: Education: Goal: Ability to describe self-care measures that may prevent or decrease complications (Diabetes Survival Skills Education) will improve Outcome: Not Progressing Goal: Individualized Educational Video(s) Outcome: Not Progressing   Problem: Coping: Goal: Ability to adjust to condition or change in health will improve Outcome: Not Progressing   Problem: Fluid Volume: Goal: Ability to maintain a balanced intake and output will improve Outcome: Not Progressing   Problem: Health Behavior/Discharge Planning: Goal: Ability to identify and utilize available resources and services will improve Outcome: Not Progressing Goal: Ability to manage health-related needs will improve Outcome: Not Progressing   Problem: Nutritional: Goal: Maintenance of adequate nutrition will improve Outcome: Not Progressing Goal: Progress toward achieving an optimal weight will improve Outcome: Not Progressing   Problem: Skin Integrity: Goal: Risk for impaired skin integrity will decrease Outcome: Not Progressing

## 2021-08-13 NOTE — Progress Notes (Signed)
[facility] Progress Note   Subjective:   Reports she has not had issues with AVF outpatient and had a fistulogram a few months ago which was normal. Prefers to wait and try to use AVF again before having another fistulogram. Outpatient AF have been 2000.   Denies SOB, CP, dizziness, abdominal pain and nausea.   Objective Vitals:   08/12/21 1731 08/12/21 2019 08/13/21 0500 08/13/21 0823  BP: (!) 141/42 (!) 115/50 (!) 135/59 136/61  Pulse: 69 63 61 61  Resp: 20 18 18 18   Temp: 98.9 F (37.2 C) 98.2 F (36.8 C) 98.7 F (37.1 C) 98.6 F (37 C)  TempSrc: Oral  Oral   SpO2: 99% 99% 100% 100%  Weight:      Height:       Physical Exam General: WDWN female, alert and in NAD Heart: RRR, no murmurs, rubs or gallops Lungs: CTA bilaterally without wheezing, rhonchi or rales Abdomen: Soft, non-tender, + nonpitting edema RLQ Extremities: trace pitting edema RLE, L BKA Dialysis Access: LUE AVF + t/b  Additional Objective Labs: Basic Metabolic Panel: Recent Labs  Lab 08/11/21 0154 08/12/21 0051 08/13/21 0136  NA 123* 119* 123*  K 5.1 4.6 3.9  CL 89* 85* 93*  CO2 25 25 24   GLUCOSE 202* 134* 179*  BUN 37* 45* 31*  CREATININE 4.66* 5.33* 3.80*  CALCIUM 8.1* 7.7* 7.1*  PHOS 5.1* 5.6* 4.0   Liver Function Tests: Recent Labs  Lab 08/11/21 0154 08/12/21 0051 08/13/21 0136  ALBUMIN 2.1* 2.2* 1.9*   No results for input(s): "LIPASE", "AMYLASE" in the last 168 hours. CBC: Recent Labs  Lab 08/10/21 0214 08/10/21 1237 08/11/21 0154 08/11/21 1453 08/12/21 0051 08/12/21 1153 08/13/21 0136  WBC 8.6 14.6* 7.2  --  8.2  --  7.0  NEUTROABS  --   --  5.5  --   --   --   --   HGB 7.9* 7.0* 5.8*   < > 7.5* 7.1* 7.0*  HCT 25.5* 22.1* 18.3*   < > 22.5* 20.6* 21.5*  MCV 95.5 94.4 93.4  --  89.3  --  91.5  PLT 222 265 245  --  223  --  185   < > = values in this interval not displayed.   Blood Culture    Component Value Date/Time   SDES TISSUE 08/10/2021 0822    SPECREQUEST LEFT HIP WOUND 08/10/2021 0822   CULT  08/10/2021 0822    RARE STAPHYLOCOCCUS AUREUS CRITICAL RESULT CALLED TO, READ BACK BY AND VERIFIED WITH: RN [name] [id number] FCP NO ANAEROBES ISOLATED; CULTURE IN PROGRESS FOR 5 DAYS    REPTSTATUS PENDING 08/10/2021 8250    Cardiac Enzymes: No results for input(s): "CKTOTAL", "CKMB", "CKMBINDEX", "TROPONINI" in the last 168 hours. CBG: Recent Labs  Lab 08/12/21 1728 08/12/21 2023 08/13/21 0022 08/13/21 0451 08/13/21 0826  GLUCAP 188* 186* 210* 191* 138*   Iron Studies:  Recent Labs    08/11/21 1453  IRON 173*  TIBC 206*  FERRITIN 983*   @lablastinr3 @ Studies/Results: No results found. Medications:  sodium chloride 10 mL/hr at 08/12/21 1629   ceFEPime (MAXIPIME) IV 2 g (08/12/21 2036)   methocarbamol (ROBAXIN) IV     vancomycin Stopped (08/12/21 1620)    sodium chloride   Intravenous Once   sodium chloride   Intravenous Once   amitriptyline  25 mg Oral QHS   amLODipine  5 mg Oral Daily   atorvastatin  40 mg Oral q1800  carvedilol  6.25 mg Oral BID   Chlorhexidine Gluconate Cloth  6 each Topical Q0600   docusate sodium  100 mg Oral BID   DULoxetine  60 mg Oral Daily   insulin aspart  0-6 Units Subcutaneous Q4H   metroNIDAZOLE  500 mg Oral Q12H   pantoprazole  40 mg Oral Daily    Dialysis Orders: [facility]-MWF 4hrs; BFR 400; DFR 500; 2K/2Ca; EDW 83kg  Assessment/Plan: 1. .  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 On antimicrobial coverage with meropenem, metronidazole, and cefepime. 2.  End-stage renal disease: On HD MWF-HD 6/12. 3.  Hypertension: Bps currently stable but not reaching EDW. Sodium is also low  but improved after dialysis yesterday. Tolerated 2.7L UF yesterday. Increase goal to 3.5L  tomorrow.  4.  Anemia: Hgb remains low, s/p PRBC on 08/11/21 but trending back down to 7 today.  Likely secondary to post-op procedure.  ESA received on 6/8, will increase dose on Friday.  5.  Chronic kidney disease-metabolic bone disease: Continue renal diet with phosphorus restriction.  Phosphorus and corrected calcium levels controlled. 6. AVF issues: RN reports difficulty cannulating, high arterial pressures and unable to reach BFR >200. Consulted [facility] who recommended fistulogram, but patient prefers to wait because she had a recent fistulogram and denies any issues with cannulation outpatient. [facility] informed. Will reconsult if we have issues with HD tomorrow.   Anice Paganini, PA-C 08/13/2021, 8:43 AM  [facility] Pager: [phone]

## 2021-08-13 NOTE — Progress Notes (Signed)
PHARMACY CONSULT NOTE FOR:  OUTPATIENT  PARENTERAL ANTIBIOTIC THERAPY (OPAT)  Indication: MSSA L-hip infection Regimen: Cefazolin 2g/HD-MWF End date: 09/22/21  IV antibiotic discharge orders are pended. To discharging provider:  please sign these orders via discharge navigator,  Select New Orders & click on the button choice - Manage This Unsigned Work.    Thank you for allowing pharmacy to be a part of this patient's care.  Alycia Rossetti, PharmD, BCPS Infectious Diseases Clinical Pharmacist 08/13/2021 8:13 AM   **Pharmacist phone directory can now be found on [email or web address] ([email or web address]).  Listed under [place].

## 2021-08-13 NOTE — Plan of Care (Signed)

## 2021-08-13 NOTE — Evaluation (Signed)
Physical Therapy Evaluation Patient Details Name: [name] MRN: [id number] DOB: 10-04-1965 Today's Date: 08/13/2021  History of Present Illness  Pt is a 56 y.o. F who presents 08/07/2021 with distal left femur fracture secondary to mechanical fall; deemed nonoperative. During the hospital course, patient had multifocal pneumonia and completed course of antibiotic on 08/07/2021.  Patient also underwent drainage of superficial abscess in the left hip on 07/28/21, culture was negative.  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  Patient underwent hardware removal on 08/10/2021. Significant PMH: HTN, HLD, ESRD, left BKA 2021, CVA, DM2, chronic pain syndrome.  Clinical Impression  PTA, pt lives with her daughter (who works full time) in a mobile home with a ramped entrance. Pt is typically modI for w/c transfers and has received a prosthesis but has not been trained on how to use it yet. Pt presents with decreased functional mobility secondary to decreased left residual limb ROM, weakness, pain, decreased skin integrity and impaired balance. Pt requiring two person moderate assist for bed mobility. Simulated lateral scoot transfers on edge of bed, but pt with difficulty achieving adequate hip clearance. Deferred transfer to chair due to increased LLE drainage (RN notified). Suspect pt will need SNF for wound care, transfer training, and strengthening     Recommendations for follow up therapy are one component of a multi-disciplinary discharge planning process, led by the attending physician.  Recommendations may be updated based on patient status, additional functional criteria and insurance authorization.  Follow Up Recommendations Skilled nursing-short term rehab (<3 hours/day)    Assistance Recommended at Discharge Frequent or constant Supervision/Assistance  Patient can return home with the following  A  lot of help with walking and/or transfers;A lot of help with bathing/dressing/bathroom;Assistance with cooking/housework;Direct supervision/assist for medications management;Direct supervision/assist for financial management;Assist for transportation;Help with stairs or ramp for entrance    Equipment Recommendations None recommended by PT  Recommendations for Other Services       Functional Status Assessment Patient has had a recent decline in their functional status and demonstrates the ability to make significant improvements in function in a reasonable and predictable amount of time.     Precautions / Restrictions Precautions Precautions: Fall;Other (comment) Precaution Comments: L BKA (baseline), increased drainage from LLE wound Restrictions Weight Bearing Restrictions: Yes LLE Weight Bearing: Non weight bearing      Mobility  Bed Mobility Overal bed mobility: Needs Assistance Bed Mobility: Supine to Sit, Sit to Supine     Supine to sit: Mod assist, +2 for physical assistance Sit to supine: Mod assist, +2 for physical assistance   General bed mobility comments: Pt utilized rails, assist for L residual limb management, use of bed pad to scoot hips    Transfers                   General transfer comment: Simulated lateral scoot transfer up to edge of bed with use of bed pad to guide hips; pt with difficulty clearing    Ambulation/Gait                  Stairs            Wheelchair Mobility    Modified Rankin (Stroke Patients Only)       Balance Overall balance assessment: Needs assistance Sitting-balance support: Feet supported Sitting balance-Leahy Scale: Fair  Pertinent Vitals/Pain Pain Assessment Pain Assessment: Faces Faces Pain Scale: Hurts even more Pain Location: L knee Pain Descriptors / Indicators: Discomfort, Sore Pain Intervention(s): Limited activity within patient's  tolerance, Monitored during session    Home Living Family/patient expects to be discharged to:: Private residence Living Arrangements: Children Available Help at Discharge: Family;Available PRN/intermittently Type of Home: Mobile home Home Access: Ramped entrance       Home Layout: One level Home Equipment: Wheelchair - manual;BSC/3in1;Rollator (4 wheels)      Prior Function Prior Level of Function : Needs assist             Mobility Comments: Pt reports she's independent with transfers without AD (via squat pivot or lateral scoot), mod I from w/c level, has received her LLE prosthesis but hasn't been trained/received PT on how to use it yet. Daughter assists with transportation to dialysis appointments. ADLs Comments: Pt reports setup assist for sponge bathing tasks, uses of BSC as w/c does not fit in bathroom. Reports able to dress self. family assists with IADLs     Hand Dominance   Dominant Hand: Right    Extremity/Trunk Assessment   Upper Extremity Assessment Upper Extremity Assessment: Defer to OT evaluation    Lower Extremity Assessment Lower Extremity Assessment: LLE deficits/detail LLE Deficits / Details: s/p BKA (2021). Grossly 1/5 quad strength, tendency to rest in external rotation and adduction    Cervical / Trunk Assessment Cervical / Trunk Assessment: Normal  Communication   Communication: No difficulties  Cognition Arousal/Alertness: Awake/alert Behavior During Therapy: WFL for tasks assessed/performed Overall Cognitive Status: Within Functional Limits for tasks assessed                                          General Comments      Exercises     Assessment/Plan    PT Assessment Patient needs continued PT services  PT Problem List Decreased strength;Pain;Decreased activity tolerance;Decreased balance;Decreased mobility;Decreased knowledge of precautions;Decreased safety awareness       PT Treatment Interventions DME  instruction;Therapeutic exercise;Balance training;Wheelchair mobility training;Neuromuscular re-education;Functional mobility training;Therapeutic activities;Patient/family education;Modalities;Manual techniques    PT Goals (Current goals can be found in the Care Plan section)  Acute Rehab PT Goals Patient Stated Goal: less pain with movement PT Goal Formulation: With patient Time For Goal Achievement: 08/27/21 Potential to Achieve Goals: Fair    Frequency Min 3X/week     Co-evaluation               AM-PAC PT "6 Clicks" Mobility  Outcome Measure Help needed turning from your back to your side while in a flat bed without using bedrails?: A Lot Help needed moving from lying on your back to sitting on the side of a flat bed without using bedrails?: A Lot Help needed moving to and from a bed to a chair (including a wheelchair)?: Total Help needed standing up from a chair using your arms (e.g., wheelchair or bedside chair)?: Total Help needed to walk in hospital room?: Total Help needed climbing 3-5 steps with a railing? : Total 6 Click Score: 8    End of Session   Activity Tolerance: Patient limited by pain;Other (comment) (LLE drainage) Patient left: in bed;with call bell/phone within reach;with bed alarm set Nurse Communication: Mobility status;Other (comment) (LLE drainage) PT Visit Diagnosis: Other abnormalities of gait and mobility (R26.89);Muscle weakness (generalized) (M62.81);History of falling (Z91.81);Pain Pain -  Right/Left: Left Pain - part of body: Leg    Time: 0177-9390 PT Time Calculation (min) (ACUTE ONLY): 27 min   Charges:   PT Evaluation $PT Eval Moderate Complexity: 1 Mod PT Treatments $Therapeutic Activity: 8-22 mins        Wyona Almas, PT, DPT Acute Rehabilitation Services Office [phone]   Deno Etienne 08/13/2021, 4:54 PM

## 2021-08-13 NOTE — Progress Notes (Addendum)
PROGRESS NOTE    [name]  TFT:[id number] DOB: [birth year] DOA: 08/07/2021 PCP: Julian Hy, PA-C    Brief Narrative:   [name] is a 56 y.o. female with past medical history significant for hypertension, hyperlipidemia, end-stage renal disease on hemodialysis Monday, Wednesday Friday, history of left BKA 2021, depression/anxiety, history of CVA, type 2 diabetes mellitus and chronic pain syndrome presented to the hospital as transfer from [facility] for distal left femur fracture secondary to mechanical fall.  This was deemed nonoperative as per [facility] orthopedics.  During the hospital course, patient had multifocal pneumonia and completed course of antibiotic on 08/07/2021.  Patient also underwent drainage of superficial abscess in the left hip on 07/28/21, culture was negative.  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  Patient underwent hardware removal on 08/10/2021.  Patient has been treated with antibiotic and dressing at this time.   Plan for 6 weeks of IV antibiotic treatment with HD, on cefepime and oral metronidazole using 6/11 as day 1.  During hospitalization, patient also had nonfunctioning dialysis fistula and is waiting for fistulogram today.  Assessment & Plan:  Principal Problem:   Septic arthritis of hip ([place]) Active Problems:   Skin abscess L hip   Type 2 diabetes mellitus with renal complication (HCC)   ESRD (end stage renal disease) on dialysis [facility])   Essential hypertension   Closed fracture of left distal femur (HCC)   Hardware complicating wound infection ([place])   Subacute osteomyelitis of left femur (HCC)   Left hip septic arthritis/osteomyelitis, abscess, hardware infection. Seen by orthopedics and infectious disease.  Patient would need extensive debridement but at this time patient has refused this.  On broad-spectrum antibiotic and wound care.   Patient  underwent hardware removal of left hip affixes nail with partial resection of tuft tissue and femur that were nonviable by Dr. [name] on 08/10/2021.  Blood cultures negative so far.  Operative cultures without organism.  Continue cefepime and metronidazole.  Plan for 6 weeks of IV antibiotic treatment with HD on cefepime and oral metronidazole using 08/11/21 as day 1.  Infectious disease on board.  Will follow recommendations.  Acute postoperative blood loss anemia Hemoglobin today at 7.0 from 7.5.  Received 2 units of packed RBC on 08/11/2021.  We will continue to monitor hemoglobin and transfuse as necessary for hemoglobin less than 7  Left femoral condyle fracture Nonweightbearing of the left lower extremity, pain control.    Skin abscess left hip I&D done previously.  Continue local wound care per  Hyponatremia Latest sodium of 123.  On hemodialysis.  We will continue to monitor.  Type 2 diabetes mellitus, insulin-dependent Latest Hemoglobin A1c 7.1 on 07/22/2021, well controlled.  Long-acting insulin on hold.  Continue sliding scale insulin.  Latest POC glucose of 138  ESRD on HD MWF --Nephrology on board for hemodialysis.  Depression/Anxiety Continue amitriptyline and duloxetine  Essential hypertension Continue amlodipine and Coreg, hydralazine on hold.  Seems to be stable at this time.  Obesity Body mass index is 31.63 kg/m.   Would benefit from weight loss as outpatient.    Nonfunctioning hemodialysis fistula.  Vascular surgery has been consulted.  Plan for fistulogram today.  DVT prophylaxis: SCDs Start: 08/10/21 1056 SCDs Start: 08/07/21 2226    Code Status: Full Code  Family Communication:  Communicated with the patient at bedside.   Disposition Plan:  Level of care: Med-Surg  Status is: Inpatient  Remains inpatient appropriate because: IV antibiotics, need for fistulogram, ID and orthopedic follow-up, may need SNF placement.    Consultants:  Orthopedics, Dr.  [name] Nephrology Infectious disease Vascular surgery  Procedures:  hardware removal of left hip affixes nail with partial resection of tuft tissue and femur that were nonviable by Dr. [name] on 08/10/2021.   Antimicrobials:  Azithromycin 5/21 - 5/30 Cefepime 5/21 - 5/28, 6/9>> Meropenem 5/30 - 6/8 Metronidazole 6/9>> Vancomycin with hemodialysis  Subjective: Today, patient was seen and examined at bedside.  Patient states that she did have some bowel movement yesterday.  Denies overt pain, fever chills or rigor.   Objective: Vitals:   08/12/21 1731 08/12/21 2019 08/13/21 0500 08/13/21 0823  BP: (!) 141/42 (!) 115/50 (!) 135/59 136/61  Pulse: 69 63 61 61  Resp: 20 18 18 18   Temp: 98.9 F (37.2 C) 98.2 F (36.8 C) 98.7 F (37.1 C) 98.6 F (37 C)  TempSrc: Oral  Oral   SpO2: 99% 99% 100% 100%  Weight:      Height:        Intake/Output Summary (Last 24 hours) at 08/13/2021 0955 Last data filed at 08/13/2021 0504 Gross per 24 hour  Intake 1414.83 ml  Output 900 ml  Net 514.83 ml     Filed Weights   08/09/21 0800 08/12/21 1036 08/12/21 1650  Weight: 99.4 kg 103.8 kg 100 kg   Body mass index is 31.63 kg/m.   Physical examination:  General: Obese built, not in obvious distress, appears chronically ill, HENT:   No scleral pallor or icterus noted. Oral mucosa is moist.  Chest:  Clear breath sounds.  Diminished breath sounds bilaterally. No crackles or wheezes.  CVS: S1 &S2 heard. No murmur.  Regular rate and rhythm. Abdomen: Soft, nontender, nondistended.  Bowel sounds are heard.   Extremities: Left below-knee amputation, left thigh wound with dressing.  Soakage of dressing noted.Marland Kitchen Psych: Alert, awake and oriented, normal mood CNS:  No cranial nerve deficits.  Moves extremities with Skin: Warm and dry.  No rashes noted.  Left thigh wound with dressing.  Data Reviewed: I have personally reviewed following labs and imaging studies  CBC: Recent Labs  Lab 08/10/21 0214  08/10/21 1237 08/11/21 0154 08/11/21 1453 08/12/21 0051 08/12/21 1153 08/13/21 0136  WBC 8.6 14.6* 7.2  --  8.2  --  7.0  NEUTROABS  --   --  5.5  --   --   --   --   HGB 7.9* 7.0* 5.8* 6.8* 7.5* 7.1* 7.0*  HCT 25.5* 22.1* 18.3* 20.5* 22.5* 20.6* 21.5*  MCV 95.5 94.4 93.4  --  89.3  --  91.5  PLT 222 265 245  --  223  --  329    Basic Metabolic Panel: Recent Labs  Lab 08/10/21 0214 08/10/21 1237 08/11/21 0154 08/12/21 0051 08/13/21 0136  NA 130* 126* 123* 119* 123*  K 4.4 4.7 5.1 4.6 3.9  CL 93* 90* 89* 85* 93*  CO2 28 25 25 25 24   GLUCOSE 122* 261* 202* 134* 179*  BUN 24* 31* 37* 45* 31*  CREATININE 3.36* 3.98* 4.66* 5.33* 3.80*  CALCIUM 8.0* 8.0* 8.1* 7.7* 7.1*  PHOS 3.9 5.4* 5.1* 5.6* 4.0    GFR: Estimated Creatinine Clearance: 21.7 mL/min (A) (by C-G formula based on SCr of 3.8 mg/dL (H)). Liver Function Tests: Recent Labs  Lab 08/10/21 0214 08/10/21 1237 08/11/21 0154 08/12/21 0051 08/13/21 0136  ALBUMIN 2.1* 2.0* 2.1* 2.2*  1.9*    No results for input(s): "LIPASE", "AMYLASE" in the last 168 hours. No results for input(s): "AMMONIA" in the last 168 hours. Coagulation Profile: No results for input(s): "INR", "PROTIME" in the last 168 hours. Cardiac Enzymes: No results for input(s): "CKTOTAL", "CKMB", "CKMBINDEX", "TROPONINI" in the last 168 hours. BNP (last 3 results) No results for input(s): "PROBNP" in the last 8760 hours. HbA1C: No results for input(s): "HGBA1C" in the last 72 hours. CBG: Recent Labs  Lab 08/12/21 1728 08/12/21 2023 08/13/21 0022 08/13/21 0451 08/13/21 0826  GLUCAP 188* 186* 210* 191* 138*    Lipid Profile: No results for input(s): "CHOL", "HDL", "LDLCALC", "TRIG", "CHOLHDL", "LDLDIRECT" in the last 72 hours. Thyroid Function Tests: No results for input(s): "TSH", "T4TOTAL", "FREET4", "T3FREE", "THYROIDAB" in the last 72 hours. Anemia Panel: Recent Labs    08/11/21 1453  VITAMINB12 646  FOLATE 14.7  FERRITIN 983*   TIBC 206*  IRON 173*  RETICCTPCT 3.2*    Sepsis Labs: No results for input(s): "PROCALCITON", "LATICACIDVEN" in the last 168 hours.  Recent Results (from the past 240 hour(s))  Surgical pcr screen     Status: None   Collection Time: 08/08/21 11:57 PM   Specimen: Nasal Mucosa; Nasal Swab  Result Value Ref Range Status   MRSA, PCR NEGATIVE NEGATIVE Final   Staphylococcus aureus NEGATIVE NEGATIVE Final    Comment: (NOTE) The Xpert SA Assay (FDA approved for NASAL specimens in patients 59 years of age and older), is one component of a comprehensive surveillance program. It is not intended to diagnose infection nor to guide or monitor treatment. Performed at [facility], [place] [address] [place], [place] [address]   Aerobic/Anaerobic Culture w Gram Stain (surgical/deep wound)     Status: None (Preliminary result)   Collection Time: 08/10/21  8:22 AM   Specimen: Wound; Abscess  Result Value Ref Range Status   Specimen Description TISSUE  Final   Special Requests LEFT HIP WOUND  Final   Gram Stain   Final    RARE WBC PRESENT,BOTH PMN AND MONONUCLEAR NO ORGANISMS SEEN Performed at [facility], [address] [place],  [address]    Culture   Final    RARE STAPHYLOCOCCUS AUREUS CRITICAL RESULT CALLED TO, READ BACK BY AND VERIFIED WITH: RN [name] [id number] FCP NO ANAEROBES ISOLATED; CULTURE IN PROGRESS FOR 5 DAYS    Report Status PENDING  Incomplete   Organism ID, Bacteria STAPHYLOCOCCUS AUREUS  Final      Susceptibility   Staphylococcus aureus - MIC*    CIPROFLOXACIN <=0.5 SENSITIVE Sensitive     ERYTHROMYCIN <=0.25 SENSITIVE Sensitive     GENTAMICIN <=0.5 SENSITIVE Sensitive     OXACILLIN 0.5 SENSITIVE Sensitive     TETRACYCLINE <=1 SENSITIVE Sensitive     VANCOMYCIN 1 SENSITIVE Sensitive     TRIMETH/SULFA <=10 SENSITIVE Sensitive     CLINDAMYCIN <=0.25 SENSITIVE Sensitive     RIFAMPIN <=0.5 SENSITIVE Sensitive     Inducible Clindamycin  NEGATIVE Sensitive     * RARE STAPHYLOCOCCUS AUREUS      Radiology Studies: No results found.    Scheduled Meds:  sodium chloride   Intravenous Once   sodium chloride   Intravenous Once   amitriptyline  25 mg Oral QHS   amLODipine  5 mg Oral Daily   atorvastatin  40 mg Oral q1800   carvedilol  6.25 mg Oral BID   Chlorhexidine Gluconate Cloth  6 each Topical Q0600  Chlorhexidine Gluconate Cloth  6 each Topical Q0600   docusate sodium  100 mg Oral BID   DULoxetine  60 mg Oral Daily   insulin aspart  0-6 Units Subcutaneous Q4H   metroNIDAZOLE  500 mg Oral Q12H   pantoprazole  40 mg Oral Daily   Continuous Infusions:  sodium chloride 10 mL/hr at 08/12/21 1629   ceFEPime (MAXIPIME) IV 2 g (08/12/21 2036)   methocarbamol (ROBAXIN) IV       LOS: 6 days    Flora Lipps, MD [facility] Available via Epic secure chat 7am-7pm After these hours, please refer to coverage provider listed on [email or web address] 08/13/2021, 9:55 AM

## 2021-08-14 DIAGNOSIS — S72492A Other fracture of lower end of left femur, initial encounter for closed fracture: Secondary | ICD-10-CM | POA: Diagnosis not present

## 2021-08-14 DIAGNOSIS — T847XXD Infection and inflammatory reaction due to other internal orthopedic prosthetic devices, implants and grafts, subsequent encounter: Secondary | ICD-10-CM

## 2021-08-14 DIAGNOSIS — N186 End stage renal disease: Secondary | ICD-10-CM | POA: Diagnosis not present

## 2021-08-14 DIAGNOSIS — M00059 Staphylococcal arthritis, unspecified hip: Secondary | ICD-10-CM | POA: Diagnosis not present

## 2021-08-14 DIAGNOSIS — I10 Essential (primary) hypertension: Secondary | ICD-10-CM | POA: Diagnosis not present

## 2021-08-14 LAB — BASIC METABOLIC PANEL
Anion gap: 6 (ref 5–15)
BUN: 39 mg/dL — ABNORMAL HIGH (ref 6–20)
CO2: 25 mmol/L (ref 22–32)
Calcium: 7.6 mg/dL — ABNORMAL LOW (ref 8.9–10.3)
Chloride: 89 mmol/L — ABNORMAL LOW (ref 98–111)
Creatinine, Ser: 4.84 mg/dL — ABNORMAL HIGH (ref 0.44–1.00)
GFR, Estimated: 10 mL/min — ABNORMAL LOW (ref >60)
Glucose, Bld: 74 mg/dL (ref 70–99)
Potassium: 4.3 mmol/L (ref 3.5–5.1)
Sodium: 120 mmol/L — ABNORMAL LOW (ref 135–145)

## 2021-08-14 LAB — CBC
HCT: 20.8 % — ABNORMAL LOW (ref 36.0–46.0)
Hemoglobin: 6.8 g/dL — CL (ref 12.0–15.0)
MCH: 29.8 pg (ref 26.0–34.0)
MCHC: 32.7 g/dL (ref 30.0–36.0)
MCV: 91.2 fL (ref 80.0–100.0)
Platelets: 240 10*3/uL (ref 150–400)
RBC: 2.28 MIL/uL — ABNORMAL LOW (ref 3.87–5.11)
RDW: 14.7 % (ref 11.5–15.5)
WBC: 9.8 10*3/uL (ref 4.0–10.5)
nRBC: 0 % (ref 0.0–0.2)

## 2021-08-14 LAB — HEMOGLOBIN AND HEMATOCRIT, BLOOD
HCT: 24.1 % — ABNORMAL LOW (ref 36.0–46.0)
Hemoglobin: 7.8 g/dL — ABNORMAL LOW (ref 12.0–15.0)

## 2021-08-14 LAB — GLUCOSE, CAPILLARY
Glucose-Capillary: 90 mg/dL (ref 70–99)
Glucose-Capillary: 117 mg/dL — ABNORMAL HIGH (ref 70–99)
Glucose-Capillary: 195 mg/dL — ABNORMAL HIGH (ref 70–99)
Glucose-Capillary: 209 mg/dL — ABNORMAL HIGH (ref 70–99)

## 2021-08-14 LAB — MAGNESIUM: Magnesium: 1.5 mg/dL — ABNORMAL LOW (ref 1.7–2.4)

## 2021-08-14 LAB — PREPARE RBC (CROSSMATCH)

## 2021-08-14 MED ORDER — SODIUM CHLORIDE 0.9% IV SOLUTION
Freq: Once | INTRAVENOUS | Status: AC
Start: 2021-08-14 — End: 2021-08-14

## 2021-08-14 NOTE — Progress Notes (Addendum)
PROGRESS NOTE    DONNICE Martinez  KDT:[id number] DOB: 05/14/1965 DOA: 08/07/2021 PCP: Julian Hy, PA-C    Brief Narrative:   [name] is a 56 y.o. female with past medical history significant for hypertension, hyperlipidemia, end-stage renal disease on hemodialysis Monday, Wednesday Friday, history of left BKA 2021, depression/anxiety, history of CVA, type 2 diabetes mellitus and chronic pain syndrome presented to the hospital as transfer from [facility] for distal left femur fracture secondary to mechanical fall.  This was deemed nonoperative as per [facility] orthopedics.  During the hospital course, patient had multifocal pneumonia and completed course of antibiotic on 08/07/2021.  Patient also underwent drainage of superficial abscess in the left hip on 07/28/21, culture was negative.  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  Patient underwent hardware removal on 08/10/2021.  Patient has been treated with antibiotic and dressing at this time.   Plan for 6 weeks of IV antibiotic treatment with HD, on cefepime and oral metronidazole using 6/11 as day 1.  Assessment & Plan:  Principal Problem:   Septic arthritis of hip ([place]) Active Problems:   Skin abscess L hip   Type 2 diabetes mellitus with renal complication (HCC)   ESRD (end stage renal disease) on dialysis [facility])   Essential hypertension   Closed fracture of left distal femur (HCC)   Hardware complicating wound infection ([place])   Subacute osteomyelitis of left femur (HCC)   Left hip septic arthritis/osteomyelitis, abscess, hardware infection. Seen by orthopedics and infectious disease.  Patient would need extensive debridement but at this time patient has refused this.  On broad-spectrum antibiotic and wound care.   Patient underwent hardware removal of left hip affixes nail with partial resection of tuft tissue and femur that were  nonviable by Dr. [name] on 08/10/2021.  Blood cultures negative so far.  Operative cultures without organism.  Continue cefepime and metronidazole.  Plan for 6 weeks of IV antibiotic treatment  with Cefazolin until 09/22/2021.  Infectious disease on board.  Will follow recommendations.  Acute postoperative blood loss anemia Hemoglobin today at 6.8 today from 7.0.  We will give additional 1 unit of packed RBC today.  Patient had recently received  2 units of packed RBC on 08/11/2021.  We will continue to monitor hemoglobin and transfuse as necessary for hemoglobin less than 7.  Patient had recently received ESA on 6/ 8.  Left femoral condyle fracture Nonweightbearing of the left lower extremity, continue pain control.    Skin abscess left hip I&D done previously.  Continue local wound care  Hyponatremia Latest sodium of 120.  On hemodialysis.  We will continue to monitor.  Hypomagnesemia.  Magnesium level of 1.5.  Type 2 diabetes mellitus, insulin-dependent Latest Hemoglobin A1c 7.1 on 07/22/2021, well controlled.  Long-acting insulin on hold.  Continue sliding scale insulin.   ESRD on HD MWF --Nephrology on board for hemodialysis.  For hemodialysis today.  Depression/Anxiety Continue amitriptyline and duloxetine  Essential hypertension Continue amlodipine and Coreg, hydralazine on hold.  Blood pressure seems to be stable at this time.  Obesity Body mass index is 33.82 kg/m.   Would benefit from weight loss as outpatient.    Malfunctioning hemodialysis fistula.  Vascular surgery consulted but patient denied fistulogram at this time  DVT prophylaxis: SCDs Start: 08/10/21 1056 SCDs Start: 08/07/21 2226    Code Status: Full Code  Family Communication:  Communicated with the patient  at bedside.   Disposition Plan:  Level of care: Med-Surg  Status is: Inpatient  Remains inpatient appropriate because: IV antibiotics, PRBC transfusion, SNF placement.   Consultants:  Orthopedics,  Dr. [name] Nephrology Infectious disease Vascular surgery  Procedures:  hardware removal of left hip affixes nail with partial resection of tuft tissue and femur that were nonviable by Dr. [name] on 08/10/2021.  Hemodialysis  Antimicrobials:  Azithromycin 5/21 - 5/30 Cefepime 5/21 - 5/28, 6/9>> Meropenem 5/30 - 6/8 Metronidazole 6/9>> Vancomycin with hemodialysis  Subjective: Today, patient was seen and examined at bedside.  Denies any chest pain, nausea, vomiting, fever or chills.   Objective: Vitals:   08/14/21 1000 08/14/21 1038 08/14/21 1100 08/14/21 1130  BP: (!) 138/52 (!) 153/54 (!) 155/69 (!) 157/65  Pulse: 64 69 68   Resp: 10 14 (!) 9   Temp:      TempSrc:      SpO2: 97%  98% 96%  Weight:      Height:        Intake/Output Summary (Last 24 hours) at 08/14/2021 1159 Last data filed at 08/13/2021 2033 Gross per 24 hour  Intake 120 ml  Output --  Net 120 ml     Filed Weights   08/12/21 1036 08/12/21 1650 08/14/21 0854  Weight: 103.8 kg 100 kg 106.9 kg   Body mass index is 33.82 kg/m.   Physical examination:  General: Obese built, not in obvious distress appears chronically ill HENT: Pallor noted.. Oral mucosa is moist.  Chest:  Clear breath sounds.  Diminished breath sounds bilaterally. No crackles or wheezes.  CVS: S1 &S2 heard. No murmur.  Regular rate and rhythm. Abdomen: Soft, nontender, nondistended.  Bowel sounds are heard.   Extremities: Left below knee amputation, left hip with dressing and soakage.Marland Kitchen Psych: Alert, awake and oriented, normal mood CNS:  No cranial nerve deficits.  Moves all extremities. Skin: Warm and dry.  Left thigh wound with dressing.  Data Reviewed: I have personally reviewed following labs and imaging studies  CBC: Recent Labs  Lab 08/10/21 1237 08/11/21 0154 08/11/21 1453 08/12/21 0051 08/12/21 1153 08/13/21 0136 08/14/21 0158  WBC 14.6* 7.2  --  8.2  --  7.0 9.8  NEUTROABS  --  5.5  --   --   --   --   --   HGB  7.0* 5.8* 6.8* 7.5* 7.1* 7.0* 6.8*  HCT 22.1* 18.3* 20.5* 22.5* 20.6* 21.5* 20.8*  MCV 94.4 93.4  --  89.3  --  91.5 91.2  PLT 265 245  --  223  --  185 124    Basic Metabolic Panel: Recent Labs  Lab 08/10/21 0214 08/10/21 1237 08/11/21 0154 08/12/21 0051 08/13/21 0136 08/14/21 0158  NA 130* 126* 123* 119* 123* 120*  K 4.4 4.7 5.1 4.6 3.9 4.3  CL 93* 90* 89* 85* 93* 89*  CO2 28 25 25 25 24 25   GLUCOSE 122* 261* 202* 134* 179* 74  BUN 24* 31* 37* 45* 31* 39*  CREATININE 3.36* 3.98* 4.66* 5.33* 3.80* 4.84*  CALCIUM 8.0* 8.0* 8.1* 7.7* 7.1* 7.6*  MG  --   --   --   --   --  1.5*  PHOS 3.9 5.4* 5.1* 5.6* 4.0  --     GFR: Estimated Creatinine Clearance: 17.6 mL/min (A) (by C-G formula based on SCr of 4.84 mg/dL (H)). Liver Function Tests: Recent Labs  Lab 08/10/21 0214 08/10/21 1237 08/11/21 0154 08/12/21 0051 08/13/21 0136  ALBUMIN 2.1*  2.0* 2.1* 2.2* 1.9*    No results for input(s): "LIPASE", "AMYLASE" in the last 168 hours. No results for input(s): "AMMONIA" in the last 168 hours. Coagulation Profile: No results for input(s): "INR", "PROTIME" in the last 168 hours. Cardiac Enzymes: No results for input(s): "CKTOTAL", "CKMB", "CKMBINDEX", "TROPONINI" in the last 168 hours. BNP (last 3 results) No results for input(s): "PROBNP" in the last 8760 hours. HbA1C: No results for input(s): "HGBA1C" in the last 72 hours. CBG: Recent Labs  Lab 08/13/21 1555 08/13/21 2023 08/13/21 2356 08/14/21 0435 08/14/21 0742  GLUCAP 117* 132* 92 90 117*    Lipid Profile: No results for input(s): "CHOL", "HDL", "LDLCALC", "TRIG", "CHOLHDL", "LDLDIRECT" in the last 72 hours. Thyroid Function Tests: No results for input(s): "TSH", "T4TOTAL", "FREET4", "T3FREE", "THYROIDAB" in the last 72 hours. Anemia Panel: Recent Labs    08/11/21 1453  VITAMINB12 646  FOLATE 14.7  FERRITIN 983*  TIBC 206*  IRON 173*  RETICCTPCT 3.2*    Sepsis Labs: No results for input(s):  "PROCALCITON", "LATICACIDVEN" in the last 168 hours.  Recent Results (from the past 240 hour(s))  Surgical pcr screen     Status: None   Collection Time: 08/08/21 11:57 PM   Specimen: Nasal Mucosa; Nasal Swab  Result Value Ref Range Status   MRSA, PCR NEGATIVE NEGATIVE Final   Staphylococcus aureus NEGATIVE NEGATIVE Final    Comment: (NOTE) The Xpert SA Assay (FDA approved for NASAL specimens in patients 73 years of age and older), is one component of a comprehensive surveillance program. It is not intended to diagnose infection nor to guide or monitor treatment. Performed at [facility], [place] [address] [place], [place] [address]   Aerobic/Anaerobic Culture w Gram Stain (surgical/deep wound)     Status: None (Preliminary result)   Collection Time: 08/10/21  8:22 AM   Specimen: Wound; Abscess  Result Value Ref Range Status   Specimen Description TISSUE  Final   Special Requests LEFT HIP WOUND  Final   Gram Stain   Final    RARE WBC PRESENT,BOTH PMN AND MONONUCLEAR NO ORGANISMS SEEN Performed at [facility], [address] [place],  [address]    Culture   Final    RARE STAPHYLOCOCCUS AUREUS CRITICAL RESULT CALLED TO, READ BACK BY AND VERIFIED WITH: RN [name] [id number] FCP NO ANAEROBES ISOLATED; CULTURE IN PROGRESS FOR 5 DAYS    Report Status PENDING  Incomplete   Organism ID, Bacteria STAPHYLOCOCCUS AUREUS  Final      Susceptibility   Staphylococcus aureus - MIC*    CIPROFLOXACIN <=0.5 SENSITIVE Sensitive     ERYTHROMYCIN <=0.25 SENSITIVE Sensitive     GENTAMICIN <=0.5 SENSITIVE Sensitive     OXACILLIN 0.5 SENSITIVE Sensitive     TETRACYCLINE <=1 SENSITIVE Sensitive     VANCOMYCIN 1 SENSITIVE Sensitive     TRIMETH/SULFA <=10 SENSITIVE Sensitive     CLINDAMYCIN <=0.25 SENSITIVE Sensitive     RIFAMPIN <=0.5 SENSITIVE Sensitive     Inducible Clindamycin NEGATIVE Sensitive     * RARE STAPHYLOCOCCUS AUREUS      Radiology Studies: No results  found.    Scheduled Meds:  sodium chloride   Intravenous Once   amitriptyline  25 mg Oral QHS   amLODipine  5 mg Oral Daily   atorvastatin  40 mg Oral q1800   carvedilol  6.25 mg Oral BID   Chlorhexidine Gluconate Cloth  6 each Topical Q0600   Chlorhexidine Gluconate Cloth  6 each Topical Q0600   docusate sodium  100 mg Oral BID   DULoxetine  60 mg Oral Daily   insulin aspart  0-6 Units Subcutaneous Q4H   metroNIDAZOLE  500 mg Oral Q12H   pantoprazole  40 mg Oral Daily   Continuous Infusions:  sodium chloride 10 mL/hr at 08/12/21 1629    ceFAZolin (ANCEF) IV     methocarbamol (ROBAXIN) IV       LOS: 7 days    Flora Lipps, MD [facility] Available via Epic secure chat 7am-7pm After these hours, please refer to coverage provider listed on [email or web address] 08/14/2021, 11:59 AM

## 2021-08-14 NOTE — Progress Notes (Addendum)
[name] Progress Note   Subjective:   Reports feeling well, denies SOB, CP, dizziness, abdominal pain and nausea. Reports edema in unchanged.   Objective Vitals:   08/14/21 0438 08/14/21 0628 08/14/21 0640 08/14/21 0742  BP: (!) 167/57 (!) 137/52 (!) 147/51 (!) 145/55  Pulse: 66 66 63 65  Resp: 17 16 18 20   Temp: 98 F (36.7 C) 98.3 F (36.8 C) 98.3 F (36.8 C) 98.6 F (37 C)  TempSrc: Oral Oral Oral Oral  SpO2: 100% 100% 100% 99%  Weight:      Height:       Physical Exam General: WDWN female, alert and in NAD Heart: RRR, no murmurs, rubs or gallops Lungs: CTA bilaterally without wheezing, rhonchi or rales Abdomen: Soft, non-tender, + nonpitting edema RLQ Extremities: trace pitting edema RLE, L BKA Dialysis Access: LUE AVF + t/b  Additional Objective Labs: Basic Metabolic Panel: Recent Labs  Lab 08/11/21 0154 08/12/21 0051 08/13/21 0136 08/14/21 0158  NA 123* 119* 123* 120*  K 5.1 4.6 3.9 4.3  CL 89* 85* 93* 89*  CO2 25 25 24 25   GLUCOSE 202* 134* 179* 74  BUN 37* 45* 31* 39*  CREATININE 4.66* 5.33* 3.80* 4.84*  CALCIUM 8.1* 7.7* 7.1* 7.6*  PHOS 5.1* 5.6* 4.0  --    Liver Function Tests: Recent Labs  Lab 08/11/21 0154 08/12/21 0051 08/13/21 0136  ALBUMIN 2.1* 2.2* 1.9*   No results for input(s): "LIPASE", "AMYLASE" in the last 168 hours. CBC: Recent Labs  Lab 08/10/21 1237 08/11/21 0154 08/11/21 1453 08/12/21 0051 08/12/21 1153 08/13/21 0136 08/14/21 0158  WBC 14.6* 7.2  --  8.2  --  7.0 9.8  NEUTROABS  --  5.5  --   --   --   --   --   HGB 7.0* 5.8*   < > 7.5* 7.1* 7.0* 6.8*  HCT 22.1* 18.3*   < > 22.5* 20.6* 21.5* 20.8*  MCV 94.4 93.4  --  89.3  --  91.5 91.2  PLT 265 245  --  223  --  185 240   < > = values in this interval not displayed.   Blood Culture    Component Value Date/Time   SDES TISSUE 08/10/2021 0822   SPECREQUEST LEFT HIP WOUND 08/10/2021 0822   CULT  08/10/2021 0822    RARE STAPHYLOCOCCUS  AUREUS CRITICAL RESULT CALLED TO, READ BACK BY AND VERIFIED WITH: RN [name] [id number] FCP NO ANAEROBES ISOLATED; CULTURE IN PROGRESS FOR 5 DAYS    REPTSTATUS PENDING 08/10/2021 1829    Cardiac Enzymes: No results for input(s): "CKTOTAL", "CKMB", "CKMBINDEX", "TROPONINI" in the last 168 hours. CBG: Recent Labs  Lab 08/13/21 1555 08/13/21 2023 08/13/21 2356 08/14/21 0435 08/14/21 0742  GLUCAP 117* 132* 92 90 117*   Iron Studies:  Recent Labs    08/11/21 1453  IRON 173*  TIBC 206*  FERRITIN 983*   @lablastinr3 @ Studies/Results: No results found. Medications:  sodium chloride 10 mL/hr at 08/12/21 1629    ceFAZolin (ANCEF) IV     methocarbamol (ROBAXIN) IV      sodium chloride   Intravenous Once   amitriptyline  25 mg Oral QHS   amLODipine  5 mg Oral Daily   atorvastatin  40 mg Oral q1800   carvedilol  6.25 mg Oral BID   Chlorhexidine Gluconate Cloth  6 each Topical Q0600   Chlorhexidine Gluconate Cloth  6 each Topical Q0600   docusate sodium  100 mg Oral  BID   DULoxetine  60 mg Oral Daily   insulin aspart  0-6 Units Subcutaneous Q4H   metroNIDAZOLE  500 mg Oral Q12H   pantoprazole  40 mg Oral Daily    Dialysis Orders: [facility]-MWF 4hrs; BFR 400; DFR 500; 2K/2Ca; EDW 83kg  Assessment/Plan: 1.  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 On antimicrobial coverage with meropenem, metronidazole, and cefepime. Will need cefazolin 2g IV q HD until 09/22/21.  2.  End-stage renal disease: On HD MWF, Had trouble cannulating last HD, see #6 below 3.  Hypertension: Bps currently stable but not reaching EDW. Sodium is also low  but improved after dialysis yesterday. Tolerated 2.7L UF last HD. Increased goal to 3.5L today 4.  Anemia: Hgb remains low, s/p PRBC on 08/11/21 but  trending back down, receiving another unit PRBC today. Likely secondary to post-op procedure.  ESA received on 6/8, will increase dose on Friday.  5.  Chronic kidney disease-metabolic bone disease: Corrected calcium and phos controlled. Continue renal diet with phosphorus restriction.  Phosphorus and corrected calcium levels controlled. 6. AVF issues: RN reports difficulty cannulating, high arterial pressures and unable to reach BFR >200. Consulted VVS who recommended fistulogram, but patient prefers to wait because she had a recent fistulogram and denies any issues with cannulation outpatient. VVS informed. Will reconsult if we have issues with HD today     Anice Paganini, PA-C 08/14/2021, 8:36 AM  [facility] Pager: [phone]

## 2021-08-14 NOTE — TOC Initial Note (Addendum)
Transition of Care [facility]) - Initial/Assessment Note    Patient Details  Name: [name] MRN: [id number] Date of Birth: 06/21/1965  Transition of Care [facility]) CM/SW Contact:    Joanne Chars, LCSW Phone Number: 08/14/2021, 3:27 PM  Clinical Narrative:   Pt is transfer from [facility], reports that SNF was set up at [facility] when she was still at [facility]  HD was also set up at [facility].  Pt still would like to pursue this plan at DC from [facility].  Pt daughter [name] lives with pt, permission given to speak to [place].  Pt is not vaccinated for covid.   1500: CSW spoke to [employer].  No beds currently, earliest potential bed next week, will need to check back on Monday to see if anything is available.                Expected Discharge Plan: Skilled Nursing Facility Barriers to Discharge: Continued Medical Work up   Patient Goals and CMS Choice Patient states their goals for this hospitalization and ongoing recovery are:: walking   Choice offered to / list presented to : Patient (pt reports SNF bed was set up at [facility])  Expected Discharge Plan and Services Expected Discharge Plan: [place] In-house Referral: Clinical Social Work Discharge Planning Services: CM Consult Post Acute Care Choice: [place] arrangements for the past 2 months: [place]                                      Prior Living Arrangements/Services Living arrangements for the past 2 months: Single Family Home Lives with:: Adult Children Patient language and need for interpreter reviewed:: Yes Do you feel safe going back to the place where you live?: Yes      Need for Family Participation in Patient Care: No (Comment) Care giver support system in place?: Yes (comment) Current home services: Other (comment) (none) Criminal Activity/Legal Involvement Pertinent to Current Situation/Hospitalization: No - Comment as  needed  Activities of Daily Living Home Assistive Devices/Equipment: Eyeglasses, Wheelchair ADL Screening (condition at time of admission) Patient's cognitive ability adequate to safely complete daily activities?: Yes Is the patient deaf or have difficulty hearing?: No Does the patient have difficulty seeing, even when wearing glasses/contacts?: No Does the patient have difficulty concentrating, remembering, or making decisions?: No Patient able to express need for assistance with ADLs?: Yes Does the patient have difficulty dressing or bathing?: Yes Independently performs ADLs?: No Communication: Independent Dressing (OT): Needs assistance Is this a change from baseline?: Pre-admission baseline Grooming: Independent Feeding: Independent Bathing: Needs assistance Is this a change from baseline?: Pre-admission baseline Toileting: Needs assistance Is this a change from baseline?: Pre-admission baseline In/Out Bed: Needs assistance Is this a change from baseline?: Pre-admission baseline Walks in Home: Dependent Is this a change from baseline?: Pre-admission baseline Does the patient have difficulty walking or climbing stairs?: Yes Weakness of Legs: Left Weakness of Arms/Hands: None  Permission Sought/Granted Permission sought to share information with : Family Supports Permission granted to share information with : Yes, Verbal Permission Granted  Share Information with NAME: daughter [name]  Permission granted to share info w AGENCY: SNF        Emotional Assessment Appearance:: Appears stated age Attitude/Demeanor/Rapport: Engaged Affect (typically observed): Appropriate, Pleasant Orientation: : Oriented to Self, Oriented to Place, Oriented to  Time, Oriented to Situation Alcohol /  Substance Use: Not Applicable Psych Involvement: No (comment)  Admission diagnosis:  Septic arthritis of hip ([place]) [M00.9] Patient Active Problem List   Diagnosis Date Noted   Hardware  complicating wound infection ([place])    Subacute osteomyelitis of left femur ([place])    Septic arthritis of hip ([place]) 07/30/2021   Skin abscess L hip 07/28/2021   UTI (urinary tract infection) 07/28/2021   HCAP (healthcare-associated pneumonia) 07/27/2021   Hyperkalemia 73/41/9379   Metabolic acidemia 02/40/9735   Closed fracture of left distal femur ([place]) 07/22/2021   Uncontrolled type 2 diabetes mellitus with hypoglycemia, with long-term current use of insulin ([place]) 07/22/2021   Femur fracture, left ([place]) 07/21/2021   Infection of superficial incisional surgical site after procedure 03/09/2020   Abscess    Hypoglycemia    Leukocytosis    Essential hypertension    Bacteremia    Sleep disturbance    Postoperative pain    Controlled type 1 diabetes mellitus with hyperglycemia ([place])    ESRD (end stage renal disease) on dialysis ([place]) 01/24/2020   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[place]) 01/23/2020   BKA stump complication (HCC)    Hyponatremia    Constipation    Post-op pain    Chronic osteomyelitis involving left ankle and foot ([place])    Ulcer of left foot with necrosis of bone ([place])    Fracture 12/28/2019   AKI (acute kidney injury) ([place]) 12/28/2019   Volume overload 12/28/2019   Acute on chronic anemia 12/28/2019   CKD (chronic kidney disease) stage 5, GFR less than 15 ml/min (HCC) 12/06/2019   History of Chopart amputation of left foot ([place]) 12/25/2017   Dyslipidemia, goal LDL below 100 07/04/2017   History of CVA (cerebrovascular accident) without residual deficits 07/04/2017   Cerebral thrombosis with cerebral infarction 04/08/2017   Acute right-sided weakness 04/07/2017   Slurred speech 04/07/2017   Right sided weakness 04/07/2017   Hyperhidrosis 09/01/2016   Migraine with aura and without status migrainosus, not intractable 07/28/2016   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[place]) 08/04/2014   CKD stage 3 due to type 2 diabetes mellitus ([place]) 06/27/2014    Vitamin D insufficiency 05/08/2014   Obesity (BMI 30.0-34.9) 05/08/2014   Unilateral amputation of left foot ([place]) 05/08/2014   Bursitis of left shoulder 02/14/2014   Neck pain 01/03/2014   Type 2 diabetes mellitus with renal complication ([place]) 32/99/2426   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.23) 01/14/2013   Insomnia 08/04/2012   Diabetic neuropathy, painful ([place]) 08/04/2011   Anxiety and depression 05/16/2010   Female stress incontinence 11/01/2007   TOBACCO DEPENDENCE 04/30/2006   GASTROESOPHAGEAL REFLUX, NO ESOPHAGITIS 04/30/2006   Irritable bowel syndrome 04/30/2006   PCP:  Julian Hy, PA-C Pharmacy:   [place], [place] [address] Phone: [phone] Fax: [phone]  [id number] - [place], [place] [address] [place] [address] Phone: [phone] Fax: [phone]     Social Determinants of Health (SDOH) Interventions    Readmission Risk Interventions    07/26/2021    3:15 PM  Readmission Risk Prevention Plan  Transportation Screening Complete  PCP or Specialist Appt within 3-5 Days Complete  HRI or [place] Complete  Palliative Care Screening Not Applicable  Medication Review (RN Care Manager) Referral to Pharmacy

## 2021-08-14 NOTE — Plan of Care (Signed)

## 2021-08-15 DIAGNOSIS — I10 Essential (primary) hypertension: Secondary | ICD-10-CM | POA: Diagnosis not present

## 2021-08-15 DIAGNOSIS — N186 End stage renal disease: Secondary | ICD-10-CM | POA: Diagnosis not present

## 2021-08-15 DIAGNOSIS — M00059 Staphylococcal arthritis, unspecified hip: Secondary | ICD-10-CM | POA: Diagnosis not present

## 2021-08-15 DIAGNOSIS — S72492A Other fracture of lower end of left femur, initial encounter for closed fracture: Secondary | ICD-10-CM | POA: Diagnosis not present

## 2021-08-15 LAB — BPAM RBC
Blood Product Expiration Date: 202306152359
Blood Product Expiration Date: 202307022359
Blood Product Expiration Date: 202307052359
Blood Product Expiration Date: 202307052359
ISSUE DATE / TIME: 202306110941
ISSUE DATE / TIME: 202306111748
ISSUE DATE / TIME: 202306140609
ISSUE DATE / TIME: 202306150654
Unit Type and Rh: 6200
Unit Type and Rh: 6200
Unit Type and Rh: 6200
Unit Type and Rh: 6200

## 2021-08-15 LAB — TYPE AND SCREEN
ABO/RH(D): A POS
Antibody Screen: NEGATIVE
Unit division: 0
Unit division: 0
Unit division: 0
Unit division: 0

## 2021-08-15 LAB — BASIC METABOLIC PANEL
Anion gap: 8 (ref 5–15)
BUN: 23 mg/dL — ABNORMAL HIGH (ref 6–20)
CO2: 26 mmol/L (ref 22–32)
Calcium: 7.4 mg/dL — ABNORMAL LOW (ref 8.9–10.3)
Chloride: 88 mmol/L — ABNORMAL LOW (ref 98–111)
Creatinine, Ser: 3.22 mg/dL — ABNORMAL HIGH (ref 0.44–1.00)
GFR, Estimated: 16 mL/min — ABNORMAL LOW (ref >60)
Glucose, Bld: 87 mg/dL (ref 70–99)
Potassium: 3.7 mmol/L (ref 3.5–5.1)
Sodium: 122 mmol/L — ABNORMAL LOW (ref 135–145)

## 2021-08-15 LAB — CBC
HCT: 22.9 % — ABNORMAL LOW (ref 36.0–46.0)
Hemoglobin: 7.6 g/dL — ABNORMAL LOW (ref 12.0–15.0)
MCH: 29.6 pg (ref 26.0–34.0)
MCHC: 33.2 g/dL (ref 30.0–36.0)
MCV: 89.1 fL (ref 80.0–100.0)
Platelets: 209 10*3/uL (ref 150–400)
RBC: 2.57 MIL/uL — ABNORMAL LOW (ref 3.87–5.11)
RDW: 15.2 % (ref 11.5–15.5)
WBC: 8.6 10*3/uL (ref 4.0–10.5)
nRBC: 0 % (ref 0.0–0.2)

## 2021-08-15 LAB — MAGNESIUM: Magnesium: 1.5 mg/dL — ABNORMAL LOW (ref 1.7–2.4)

## 2021-08-15 LAB — GLUCOSE, CAPILLARY
Glucose-Capillary: 110 mg/dL — ABNORMAL HIGH (ref 70–99)
Glucose-Capillary: 138 mg/dL — ABNORMAL HIGH (ref 70–99)
Glucose-Capillary: 160 mg/dL — ABNORMAL HIGH (ref 70–99)
Glucose-Capillary: 179 mg/dL — ABNORMAL HIGH (ref 70–99)
Glucose-Capillary: 189 mg/dL — ABNORMAL HIGH (ref 70–99)
Glucose-Capillary: 194 mg/dL — ABNORMAL HIGH (ref 70–99)
Glucose-Capillary: 75 mg/dL (ref 70–99)

## 2021-08-15 LAB — AEROBIC/ANAEROBIC CULTURE W GRAM STAIN (SURGICAL/DEEP WOUND)

## 2021-08-15 MED ORDER — MAGNESIUM OXIDE -MG SUPPLEMENT 400 (240 MG) MG PO TABS
400.0000 mg | ORAL_TABLET | Freq: Two times a day (BID) | ORAL | Status: DC
  Administered 2021-08-15 – 2021-08-23 (×17): 400 mg via ORAL
  Filled 2021-08-15 (×17): qty 1

## 2021-08-15 MED ORDER — CHLORHEXIDINE GLUCONATE CLOTH 2 % EX PADS
6.0000 | MEDICATED_PAD | Freq: Every day | CUTANEOUS | Status: DC
  Administered 2021-08-15 – 2021-08-23 (×7): 6 via TOPICAL

## 2021-08-15 NOTE — Progress Notes (Signed)
In to change pt dressing, moved pt bag and heard pills, asked pt if she had any medication in the bag she stated "no" told pt I had to check because I heard it and she stated "no one else has looked" looked in pt bag pt has muscle relaxer, insulin, and lomotil. Explained that I would have to send medication to pharmacy. Pt later commented that she has to take insulin everytime she eats, explained to her that her blood sugars are being checked q 4 and Ssi is given as needed and last night I covered her when her blood sugar was 207 and the additional insulin she gave herself is why her blood sugar dropped to 75 and I had to give her OJ at the midnight check. Oncall paged to updated.

## 2021-08-15 NOTE — Progress Notes (Signed)
OT Cancellation Note  Patient Details Name: [name] MRN: [id number] DOB: [birth year]   Cancelled Treatment:    Reason Eval/Treat Not Completed: Patient declined, no reason specified.   [name] 08/15/2021, 12:57 PM Cleta Alberts, OTR/L Acute Rehabilitation Services Office: [phone]

## 2021-08-15 NOTE — NC FL2 (Signed)
[place] LEVEL OF CARE SCREENING TOOL     IDENTIFICATION  Patient Name: [name] Birthdate: 20-Mar-1965 Sex: female Admission Date (Current Location): 08/07/2021  [place] and [place] Number:  [name] [id number] [place] and Address:  The . [facility], [place] [address] [place], [place] [address]      Provider Number: [id number]  Attending Physician Name and Address:  Flora Lipps, MD  Relative Name and Phone Number:  Kenedi, Cilia Daughter   [phone]    Current Level of Care: Hospital Recommended Level of Care: [place] Prior Approval Number:    Date Approved/Denied:   PASRR Number: [id number]  Discharge Plan: SNF    Current Diagnoses: Patient Active Problem List   Diagnosis Date Noted   Hypomagnesemia 70/17/7939   Hardware complicating wound infection ([place])    Subacute osteomyelitis of left femur ([place])    Septic arthritis of hip ([place]) 07/30/2021   Skin abscess L hip 07/28/2021   UTI (urinary tract infection) 07/28/2021   HCAP (healthcare-associated pneumonia) 07/27/2021   Hyperkalemia 03/00/9233   Metabolic acidemia 00/76/2263   Closed fracture of left distal femur ([place]) 07/22/2021   Uncontrolled type 2 diabetes mellitus with hypoglycemia, with long-term current use of insulin ([place]) 07/22/2021   Femur fracture, left ([place]) 07/21/2021   Infection of superficial incisional surgical site after procedure 03/09/2020   Abscess    Hypoglycemia    Leukocytosis    Essential hypertension    Bacteremia    Sleep disturbance    Postoperative pain    Controlled type 1 diabetes mellitus with hyperglycemia ([place])    ESRD (end stage renal disease) on dialysis ([place]) 01/24/2020   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[place]) 01/23/2020   BKA stump complication (HCC)    Hyponatremia    Constipation    Post-op pain    Chronic osteomyelitis involving left ankle and foot ([place])    Ulcer of left foot with  necrosis of bone ([place])    Fracture 12/28/2019   AKI (acute kidney injury) ([place]) 12/28/2019   Volume overload 12/28/2019   Acute on chronic anemia 12/28/2019   CKD (chronic kidney disease) stage 5, GFR less than 15 ml/min ([place]) 12/06/2019   History of Chopart amputation of left foot ([place]) 12/25/2017   Dyslipidemia, goal LDL below 100 07/04/2017   History of CVA (cerebrovascular accident) without residual deficits 07/04/2017   Cerebral thrombosis with cerebral infarction 04/08/2017   Acute right-sided weakness 04/07/2017   Slurred speech 04/07/2017   Right sided weakness 04/07/2017   Hyperhidrosis 09/01/2016   Migraine with aura and without status migrainosus, not intractable 07/28/2016   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[place]) 08/04/2014   CKD stage 3 due to type 2 diabetes mellitus ([place]) 06/27/2014   Vitamin D insufficiency 05/08/2014   Obesity (BMI 30.0-34.9) 05/08/2014   Unilateral amputation of left foot ([place]) 05/08/2014   Bursitis of left shoulder 02/14/2014   Neck pain 01/03/2014   Type 2 diabetes mellitus with renal complication ([place]) 33/54/5625   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.23) 01/14/2013   Insomnia 08/04/2012   Diabetic neuropathy, painful ([place]) 08/04/2011   Anxiety and depression 05/16/2010   Female stress incontinence 11/01/2007   TOBACCO DEPENDENCE 04/30/2006   GASTROESOPHAGEAL REFLUX, NO ESOPHAGITIS 04/30/2006   Irritable bowel syndrome 04/30/2006    Orientation RESPIRATION BLADDER Height & Weight     Self, Time, Situation, Place  Normal Continent, External catheter Weight: 229 lb 8 oz (104.1 kg)  Height:  5\' 10"  (177.8 cm)  BEHAVIORAL SYMPTOMS/MOOD NEUROLOGICAL BOWEL NUTRITION STATUS      Continent Diet (see discharge summary)  AMBULATORY STATUS COMMUNICATION OF NEEDS Skin   Total Care Verbally Surgical wounds                       Personal Care Assistance Level of Assistance  Bathing, Feeding,  Dressing Bathing Assistance: Limited assistance Feeding assistance: Independent Dressing Assistance: Maximum assistance     Functional Limitations Info  Sight, Hearing, Speech Sight Info: Adequate Hearing Info: Adequate Speech Info: Adequate    SPECIAL CARE FACTORS FREQUENCY  PT (By licensed PT), OT (By licensed OT)     PT Frequency: 5x week OT Frequency: 5x week            Contractures Contractures Info: Not present    Additional Factors Info  Code Status, Allergies, Insulin Sliding Scale Code Status Info: full Allergies Info: Trazodone, Latex, Lidocaine   Insulin Sliding Scale Info: Novolog, 0-6 units Q4 hours, see discharge summary       Current Medications (08/15/2021):  This is the current hospital active medication list Current Facility-Administered Medications  Medication Dose Route Frequency Provider Last Rate Last Admin   0.9 %  sodium chloride infusion   Intravenous Continuous Newt Minion, MD 10 mL/hr at 08/12/21 1629 Infusion Verify at 08/12/21 1629   acetaminophen (TYLENOL) tablet 650 mg  650 mg Oral Q6H PRN Newt Minion, MD   650 mg at 08/11/21 0047   Or   acetaminophen (TYLENOL) suppository 650 mg  650 mg Rectal Q6H PRN Newt Minion, MD       amitriptyline (ELAVIL) tablet 25 mg  25 mg Oral QHS Newt Minion, MD   25 mg at 08/14/21 2131   amLODipine (NORVASC) tablet 5 mg  5 mg Oral Daily Newt Minion, MD   5 mg at 08/14/21 1501   atorvastatin (LIPITOR) tablet 40 mg  40 mg Oral q1800 Newt Minion, MD   40 mg at 08/14/21 1808   bisacodyl (DULCOLAX) suppository 10 mg  10 mg Rectal Daily PRN Newt Minion, MD       carvedilol (COREG) tablet 6.25 mg  6.25 mg Oral BID Newt Minion, MD   6.25 mg at 08/14/21 2131   ceFAZolin (ANCEF) IVPB 2g/100 mL premix  2 g Intravenous Q M,W,F-1800 Carlyle Basques, MD   Stopped at 08/14/21 1900   Chlorhexidine Gluconate Cloth 2 % PADS 6 each  6 each Topical Q0600 Janalee Dane, PA-C       docusate sodium  (COLACE) capsule 100 mg  100 mg Oral BID Newt Minion, MD   100 mg at 08/13/21 0959   DULoxetine (CYMBALTA) DR capsule 60 mg  60 mg Oral Daily Newt Minion, MD   60 mg at 08/14/21 1501   HYDROmorphone (DILAUDID) injection 0.5-1 mg  0.5-1 mg Intravenous Q4H PRN Newt Minion, MD   1 mg at 08/10/21 2044   insulin aspart (novoLOG) injection 0-6 Units  0-6 Units Subcutaneous Q4H Newt Minion, MD   2 Units at 08/14/21 2131   magnesium oxide (MAG-OX) tablet 400 mg  400 mg Oral BID Pokhrel, Laxman, MD       methocarbamol (ROBAXIN) tablet 500 mg  500 mg Oral Q6H PRN Newt Minion, MD   500 mg at 08/14/21 2131   Or   methocarbamol (ROBAXIN) 500 mg in dextrose 5 % 50  mL IVPB  500 mg Intravenous Q6H PRN Newt Minion, MD       metoCLOPramide (REGLAN) tablet 5-10 mg  5-10 mg Oral Q8H PRN Newt Minion, MD       Or   metoCLOPramide (REGLAN) injection 5-10 mg  5-10 mg Intravenous Q8H PRN Newt Minion, MD       metroNIDAZOLE (FLAGYL) tablet 500 mg  500 mg Oral Q12H Newt Minion, MD   500 mg at 08/14/21 2131   nicotine (NICODERM CQ - dosed in mg/24 hours) patch 21 mg  21 mg Transdermal Daily PRN Newt Minion, MD       ondansetron [facility]) tablet 4 mg  4 mg Oral Q6H PRN Newt Minion, MD       Or   ondansetron [facility]) injection 4 mg  4 mg Intravenous Q6H PRN Newt Minion, MD   4 mg at 08/12/21 9379   oxyCODONE (Oxy IR/ROXICODONE) immediate release tablet 10-15 mg  10-15 mg Oral Q4H PRN Newt Minion, MD   15 mg at 08/15/21 0453   oxyCODONE (Oxy IR/ROXICODONE) immediate release tablet 5-10 mg  5-10 mg Oral Q4H PRN Newt Minion, MD   5 mg at 08/10/21 1102   pantoprazole (PROTONIX) EC tablet 40 mg  40 mg Oral Daily Newt Minion, MD   40 mg at 08/14/21 1500   polyethylene glycol (MIRALAX / GLYCOLAX) packet 17 g  17 g Oral Daily PRN Newt Minion, MD         Discharge Medications: Please see discharge summary for a list of discharge medications.  Relevant Imaging Results:  Relevant  Lab Results:   Additional Information SS#[id number]. Pt is not vaccinated for covid.  HD patient at [facility] on MWF. Pt arrives at 11:55 for 12:15 chair time.  Joanne Chars, LCSW

## 2021-08-15 NOTE — TOC Progression Note (Signed)
Transition of Care [facility]) - Progression Note    Patient Details  Name: JODELL WEITMAN MRN: [id number] Date of Birth: 17-Nov-1965  [facility] CM/SW Contact  Joanne Chars, LCSW Phone Number: 08/15/2021, 9:51 AM  Clinical Narrative:   CSW informed pt that no beds currently available at [facility].  She would like to pursue SNF in [place].  Referral sent out to [facility] locations.    Expected Discharge Plan: [place] Barriers to Discharge: Continued Medical Work up  Expected Discharge Plan and Services Expected Discharge Plan: [place] In-house Referral: Clinical Social Work Discharge Planning Services: CM Consult Post Acute Care Choice: [place] arrangements for the past 2 months: Single Family Home                                       Social Determinants of Health (SDOH) Interventions    Readmission Risk Interventions    07/26/2021    3:15 PM  Readmission Risk Prevention Plan  Transportation Screening Complete  PCP or Specialist Appt within 3-5 Days Complete  HRI or Home Care Consult Complete  Palliative Care Screening Not Applicable  Medication Review (RN Care Manager) Referral to Pharmacy

## 2021-08-15 NOTE — Progress Notes (Signed)
PT Cancellation Note  Patient Details Name: [name] MRN: [id number] DOB: 04/16/1965   Cancelled Treatment:    Reason Eval/Treat Not Completed: Other (comment).  Pt is declining therapy today, and will retry as time and pt allow.   Ramond Dial 08/15/2021, 12:58 PM  Mee Hives, PT PhD Acute Rehab Dept. Number: [place] and [place]

## 2021-08-15 NOTE — Plan of Care (Signed)

## 2021-08-15 NOTE — Progress Notes (Signed)
Patient ID: [name], female   DOB: 1966/02/21, 56 y.o.   MRN: [id number] Patient is status post removal of infected deep retained hardware left femur.  The dressing is clean and dry.  Anticipate discharge to rehab.  Antibiotic coverage based on culture sensitivities.  Continue dry dressing change.  Discussed that we we will proceed with prosthetic use once the distal femur has healed and the proximal thigh wound has healed.

## 2021-08-15 NOTE — Plan of Care (Signed)
  Problem: Education: Goal: Ability to describe self-care measures that may prevent or decrease complications (Diabetes Survival Skills Education) will improve Outcome: Progressing Goal: Individualized Educational Video(s) Outcome: Progressing   Problem: Coping: Goal: Ability to adjust to condition or change in health will improve Outcome: Progressing   Problem: Fluid Volume: Goal: Ability to maintain a balanced intake and output will improve Outcome: Progressing   Problem: Health Behavior/Discharge Planning: Goal: Ability to identify and utilize available resources and services will improve Outcome: Progressing Goal: Ability to manage health-related needs will improve Outcome: Progressing   Problem: Nutritional: Goal: Maintenance of adequate nutrition will improve Outcome: Progressing Goal: Progress toward achieving an optimal weight will improve Outcome: Progressing   Problem: Health Behavior/Discharge Planning: Goal: Ability to manage health-related needs will improve Outcome: Progressing   Problem: Tissue Perfusion: Goal: Adequacy of tissue perfusion will improve Outcome: Progressing

## 2021-08-15 NOTE — Progress Notes (Signed)
[facility] Progress Note   Subjective:   Seen in room, reports she is tired, was awoken very early this AM. Denies SOB, CP, dizziness, abdominal pain and nausea. 3L UF with HD yesterday, tolerated well.   Objective Vitals:   08/14/21 1530 08/14/21 2001 08/15/21 0441 08/15/21 0746  BP: (!) 140/51 (!) 145/46 (!) 146/71 (!) 151/56  Pulse: 70 66 67 71  Resp: 15 15 15 17   Temp: 98.5 F (36.9 C) 98.8 F (37.1 C) 98.3 F (36.8 C) 98.6 F (37 C)  TempSrc: Oral Oral Oral Oral  SpO2: 98% 99% 99% 99%  Weight:      Height:       Physical Exam General: WDWN female, alert and in NAD Heart: RRR, no murmurs, rubs or gallops Lungs: CTA bilaterally without wheezing, rhonchi or rales Abdomen: Soft, non-tender, + nonpitting edema RLQ Extremities: trace pitting edema RLE, L BKA Dialysis Access: LUE AVF + t/b  Additional Objective Labs: Basic Metabolic Panel: Recent Labs  Lab 08/11/21 0154 08/12/21 0051 08/13/21 0136 08/14/21 0158 08/15/21 0145  NA 123* 119* 123* 120* 122*  K 5.1 4.6 3.9 4.3 3.7  CL 89* 85* 93* 89* 88*  CO2 25 25 24 25 26   GLUCOSE 202* 134* 179* 74 87  BUN 37* 45* 31* 39* 23*  CREATININE 4.66* 5.33* 3.80* 4.84* 3.22*  CALCIUM 8.1* 7.7* 7.1* 7.6* 7.4*  PHOS 5.1* 5.6* 4.0  --   --    Liver Function Tests: Recent Labs  Lab 08/11/21 0154 08/12/21 0051 08/13/21 0136  ALBUMIN 2.1* 2.2* 1.9*   No results for input(s): "LIPASE", "AMYLASE" in the last 168 hours. CBC: Recent Labs  Lab 08/11/21 0154 08/11/21 1453 08/12/21 0051 08/12/21 1153 08/13/21 0136 08/14/21 0158 08/14/21 1822 08/15/21 0145  WBC 7.2  --  8.2  --  7.0 9.8  --  8.6  NEUTROABS 5.5  --   --   --   --   --   --   --   HGB 5.8*   < > 7.5*   < > 7.0* 6.8* 7.8* 7.6*  HCT 18.3*   < > 22.5*   < > 21.5* 20.8* 24.1* 22.9*  MCV 93.4  --  89.3  --  91.5 91.2  --  89.1  PLT 245  --  223  --  185 240  --  209   < > = values in this interval not displayed.   Blood Culture     Component Value Date/Time   SDES TISSUE 08/10/2021 0822   SPECREQUEST LEFT HIP WOUND 08/10/2021 0822   CULT  08/10/2021 6122    RARE STAPHYLOCOCCUS AUREUS CRITICAL RESULT CALLED TO, READ BACK BY AND VERIFIED WITH: RN [name] [id number] FCP NO ANAEROBES ISOLATED Performed at [facility], [place] [address] [place], [place] [address]    REPTSTATUS PENDING 08/10/2021 0141    Cardiac Enzymes: No results for input(s): "CKTOTAL", "CKMB", "CKMBINDEX", "TROPONINI" in the last 168 hours. CBG: Recent Labs  Lab 08/14/21 1524 08/14/21 1955 08/15/21 0004 08/15/21 0408 08/15/21 0748  GLUCAP 195* 209* 75 110* 138*   Iron Studies: No results for input(s): "IRON", "TIBC", "TRANSFERRIN", "FERRITIN" in the last 72 hours. @lablastinr3 @ Studies/Results: No results found. Medications:  sodium chloride 10 mL/hr at 08/12/21 1629    ceFAZolin (ANCEF) IV Stopped (08/14/21 1900)   methocarbamol (ROBAXIN) IV      amitriptyline  25 mg Oral QHS   amLODipine  5 mg Oral Daily  atorvastatin  40 mg Oral q1800   carvedilol  6.25 mg Oral BID   Chlorhexidine Gluconate Cloth  6 each Topical Q0600   Chlorhexidine Gluconate Cloth  6 each Topical Q0600   docusate sodium  100 mg Oral BID   DULoxetine  60 mg Oral Daily   insulin aspart  0-6 Units Subcutaneous Q4H   magnesium oxide  400 mg Oral BID   metroNIDAZOLE  500 mg Oral Q12H   pantoprazole  40 mg Oral Daily    Dialysis Orders: [facility]-MWF 4hrs; BFR 400; DFR 500; 2K/2Ca; EDW 83kg  Assessment/Plan: 1.  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 On antimicrobial coverage with meropenem, metronidazole, and cefepime. Will need cefazolin 2g IV q HD until 09/22/21.  2.  End-stage renal disease: On HD MWF, Had trouble cannulating early this week,  see #6 below 3.  Hypertension: Bps currently stable but not reaching EDW. Sodium is also low  but improved after dialysis yesterday. Increasing UF goal to 4L tomorrow.  4.  Anemia: Hgb remains low, s/p PRBC. Likely secondary to post-op procedure.  ESA received on 6/8, will increase dose on Friday.  5.  Chronic kidney disease-metabolic bone disease: Corrected calcium and phos controlled. Continue renal diet with phosphorus restriction.  Phosphorus and corrected calcium levels controlled. 6. AVF issues: RN reports difficulty cannulating, high arterial pressures and unable to reach BFR >200. Consulted [facility] who recommended fistulogram, but patient prefers to wait because she had a recent fistulogram and denies any issues with cannulation outpatient. [facility] informed. Will reconsult if we have issues with HD  Anice Paganini, PA-C 08/15/2021, 8:50 AM  [facility] Pager: [phone]

## 2021-08-15 NOTE — Progress Notes (Addendum)
PROGRESS NOTE    [name]  EHM:[id number] DOB: 1965/08/10 DOA: 08/07/2021 PCP: Julian Hy, PA-C    Brief Narrative:   [name] is a 55 y.o. female with past medical history significant for hypertension, hyperlipidemia, end-stage renal disease on hemodialysis Monday, Wednesday Friday, history of left BKA 2021, depression/anxiety, history of CVA, type 2 diabetes mellitus and chronic pain syndrome presented to the hospital as transfer from [facility] for distal left femur fracture secondary to mechanical fall.  This was deemed nonoperative as per [facility] orthopedics.  During the hospital course, patient had multifocal pneumonia and completed course of antibiotic on 08/07/2021.  Patient also underwent drainage of superficial abscess in the left hip on 07/28/21, culture was negative.  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  Patient underwent hardware removal on 08/10/2021.  Patient has been treated with antibiotic and dressing at this time.   Plan for 6 weeks of IV antibiotic treatment with HD,  using 6/11 as day 1.  Assessment & Plan:  Principal Problem:   Septic arthritis of hip ([place]) Active Problems:   Skin abscess L hip   Type 2 diabetes mellitus with renal complication (HCC)   ESRD (end stage renal disease) on dialysis [facility])   Essential hypertension   Closed fracture of left distal femur (HCC)   Hardware complicating wound infection ([place])   Subacute osteomyelitis of left femur (HCC)   Hypomagnesemia   Left hip septic arthritis/osteomyelitis, abscess, hardware infection. Seen by orthopedics and infectious disease.   Patient underwent hardware removal of left hip affixes nail with partial resection of tuft tissue and femur that were nonviable by Dr. [name] on 08/10/2021.  Blood cultures negative.  Operative cultures without organism.  Plan for 6 weeks of IV antibiotic treatment  with  Cefazolin until 09/22/2021 as per infectious disease.  Acute postoperative blood loss anemia Hemoglobin today at 7.6 after PRBC transfusion of 1 unit..  Patient had recently received  2 units of packed RBC on 08/11/2021. Patient had recently received ESA on 6/ 8.  Monitor CBC in AM.  Left femoral condyle fracture Nonweightbearing of the left lower extremity, continue pain control.  Patient has refused physical therapy today.  Skin abscess left hip I&D done previously.  Continue local wound care  Hyponatremia Latest sodium of 122.  On hemodialysis.  We will continue to monitor.  Hypomagnesemia.  Magnesium level of 1.5.  We will replace with magnesium oxide orally.  Type 2 diabetes mellitus, insulin-dependent Latest Hemoglobin A1c 7.1 on 07/22/2021, well controlled.  Long-acting insulin on hold.  Continue sliding scale insulin.  Latest POC glucose of 160.  ESRD on HD MWF Nephrology on board for hemodialysis.  Depression/Anxiety Continue amitriptyline and duloxetine  Essential hypertension Continue amlodipine and Coreg. Hydralazine on hold.  Blood pressure seems to be stable at this time.  Obesity Body mass index is 32.93 kg/m.   Would benefit from weight loss as outpatient.    Malfunctioning hemodialysis fistula.  Vascular surgery was consulted but patient denied fistulogram at this time  DVT prophylaxis: SCDs Start: 08/10/21 1056 SCDs Start: 08/07/21 2226    Code Status: Full Code  Family Communication:  Communicated with the patient at bedside.  I tried to reach patient's daughter on the phone but was unable to reach her today.  Disposition Plan: Skilled nursing facility  Level of care: Med-Surg  Status is: Inpatient  Remains inpatient appropriate because:  IV antibiotics, status post PRBC transfusion, SNF placement.   Consultants:  Orthopedics, Dr. [name] Nephrology Infectious disease Vascular surgery  Procedures:  hardware removal of left hip affixes nail with  partial resection of tuft tissue and femur that were nonviable by Dr. [name] on 08/10/2021.  Hemodialysis PRBC transfusion  Antimicrobials:  Cefazolin 08/14/2021  Subjective: Today, patient was seen and examined at bedside.  Denies pain, nausea, vomiting, fever, chills or rigor.     Objective: Vitals:   08/14/21 1530 08/14/21 2001 08/15/21 0441 08/15/21 0746  BP: (!) 140/51 (!) 145/46 (!) 146/71 (!) 151/56  Pulse: 70 66 67 71  Resp: 15 15 15 17   Temp: 98.5 F (36.9 C) 98.8 F (37.1 C) 98.3 F (36.8 C) 98.6 F (37 C)  TempSrc: Oral Oral Oral Oral  SpO2: 98% 99% 99% 99%  Weight:      Height:        Intake/Output Summary (Last 24 hours) at 08/15/2021 1332 Last data filed at 08/15/2021 0441 Gross per 24 hour  Intake 1590.14 ml  Output 500 ml  Net 1090.14 ml     Filed Weights   08/12/21 1650 08/14/21 0854 08/14/21 1307  Weight: 100 kg 106.9 kg 104.1 kg   Body mass index is 32.93 kg/m.   Physical examination:  General: Obese built, not in obvious distress chronically ill HENT:   No scleral pallor or icterus noted. Oral mucosa is moist.  Chest:   Diminished breath sounds bilaterally. No crackles or wheezes.  CVS: S1 &S2 heard. No murmur.  Regular rate and rhythm. Abdomen: Soft, nontender, nondistended.  Bowel sounds are heard.   Extremities: Left below-knee amputation, left hip with dressing. Psych: Alert, awake and oriented, normal mood CNS:  No cranial nerve deficits.   Skin: Left hip with dressing.  Data Reviewed: I have personally reviewed following labs and imaging studies  CBC: Recent Labs  Lab 08/11/21 0154 08/11/21 1453 08/12/21 0051 08/12/21 1153 08/13/21 0136 08/14/21 0158 08/14/21 1822 08/15/21 0145  WBC 7.2  --  8.2  --  7.0 9.8  --  8.6  NEUTROABS 5.5  --   --   --   --   --   --   --   HGB 5.8*   < > 7.5* 7.1* 7.0* 6.8* 7.8* 7.6*  HCT 18.3*   < > 22.5* 20.6* 21.5* 20.8* 24.1* 22.9*  MCV 93.4  --  89.3  --  91.5 91.2  --  89.1  PLT 245  --   223  --  185 240  --  209   < > = values in this interval not displayed.    Basic Metabolic Panel: Recent Labs  Lab 08/10/21 0214 08/10/21 1237 08/11/21 0154 08/12/21 0051 08/13/21 0136 08/14/21 0158 08/15/21 0145  NA 130* 126* 123* 119* 123* 120* 122*  K 4.4 4.7 5.1 4.6 3.9 4.3 3.7  CL 93* 90* 89* 85* 93* 89* 88*  CO2 28 25 25 25 24 25 26   GLUCOSE 122* 261* 202* 134* 179* 74 87  BUN 24* 31* 37* 45* 31* 39* 23*  CREATININE 3.36* 3.98* 4.66* 5.33* 3.80* 4.84* 3.22*  CALCIUM 8.0* 8.0* 8.1* 7.7* 7.1* 7.6* 7.4*  MG  --   --   --   --   --  1.5* 1.5*  PHOS 3.9 5.4* 5.1* 5.6* 4.0  --   --     GFR: Estimated Creatinine Clearance: 26.1 mL/min (A) (by C-G formula based on SCr of 3.22 mg/dL (H)).  Liver Function Tests: Recent Labs  Lab 08/10/21 0214 08/10/21 1237 08/11/21 0154 08/12/21 0051 08/13/21 0136  ALBUMIN 2.1* 2.0* 2.1* 2.2* 1.9*    No results for input(s): "LIPASE", "AMYLASE" in the last 168 hours. No results for input(s): "AMMONIA" in the last 168 hours. Coagulation Profile: No results for input(s): "INR", "PROTIME" in the last 168 hours. Cardiac Enzymes: No results for input(s): "CKTOTAL", "CKMB", "CKMBINDEX", "TROPONINI" in the last 168 hours. BNP (last 3 results) No results for input(s): "PROBNP" in the last 8760 hours. HbA1C: No results for input(s): "HGBA1C" in the last 72 hours. CBG: Recent Labs  Lab 08/14/21 1955 08/15/21 0004 08/15/21 0408 08/15/21 0748 08/15/21 1108  GLUCAP 209* 75 110* 138* 160*    Lipid Profile: No results for input(s): "CHOL", "HDL", "LDLCALC", "TRIG", "CHOLHDL", "LDLDIRECT" in the last 72 hours. Thyroid Function Tests: No results for input(s): "TSH", "T4TOTAL", "FREET4", "T3FREE", "THYROIDAB" in the last 72 hours. Anemia Panel: No results for input(s): "VITAMINB12", "FOLATE", "FERRITIN", "TIBC", "IRON", "RETICCTPCT" in the last 72 hours.  Sepsis Labs: No results for input(s): "PROCALCITON", "LATICACIDVEN" in the last 168  hours.  Recent Results (from the past 240 hour(s))  Surgical pcr screen     Status: None   Collection Time: 08/08/21 11:57 PM   Specimen: Nasal Mucosa; Nasal Swab  Result Value Ref Range Status   MRSA, PCR NEGATIVE NEGATIVE Final   Staphylococcus aureus NEGATIVE NEGATIVE Final    Comment: (NOTE) The Xpert SA Assay (FDA approved for NASAL specimens in patients 62 years of age and older), is one component of a comprehensive surveillance program. It is not intended to diagnose infection nor to guide or monitor treatment. Performed at [facility], [address], [place], [place] [address]   Aerobic/Anaerobic Culture w Gram Stain (surgical/deep wound)     Status: None (Preliminary result)   Collection Time: 08/10/21  8:22 AM   Specimen: Wound; Abscess  Result Value Ref Range Status   Specimen Description TISSUE  Final   Special Requests LEFT HIP WOUND  Final   Gram Stain   Final    RARE WBC PRESENT,BOTH PMN AND MONONUCLEAR NO ORGANISMS SEEN    Culture   Final    RARE STAPHYLOCOCCUS AUREUS CRITICAL RESULT CALLED TO, READ BACK BY AND VERIFIED WITH: RN [name] [id number] FCP NO ANAEROBES ISOLATED Performed at [facility], [place] [address] [place], [place] [address]    Report Status PENDING  Incomplete   Organism ID, Bacteria STAPHYLOCOCCUS AUREUS  Final      Susceptibility   Staphylococcus aureus - MIC*    CIPROFLOXACIN <=0.5 SENSITIVE Sensitive     ERYTHROMYCIN <=0.25 SENSITIVE Sensitive     GENTAMICIN <=0.5 SENSITIVE Sensitive     OXACILLIN 0.5 SENSITIVE Sensitive     TETRACYCLINE <=1 SENSITIVE Sensitive     VANCOMYCIN 1 SENSITIVE Sensitive     TRIMETH/SULFA <=10 SENSITIVE Sensitive     CLINDAMYCIN <=0.25 SENSITIVE Sensitive     RIFAMPIN <=0.5 SENSITIVE Sensitive     Inducible Clindamycin NEGATIVE Sensitive     * RARE STAPHYLOCOCCUS AUREUS      Radiology Studies: No results found.    Scheduled Meds:  amitriptyline  25 mg Oral QHS   amLODipine   5 mg Oral Daily   atorvastatin  40 mg Oral q1800   carvedilol  6.25 mg Oral BID   Chlorhexidine Gluconate Cloth  6 each Topical Q0600   docusate sodium  100 mg Oral BID   DULoxetine  60 mg Oral Daily   insulin aspart  0-6 Units Subcutaneous Q4H   magnesium oxide  400 mg Oral BID   metroNIDAZOLE  500 mg Oral Q12H   pantoprazole  40 mg Oral Daily   Continuous Infusions:  sodium chloride 10 mL/hr at 08/12/21 1629    ceFAZolin (ANCEF) IV Stopped (08/14/21 1900)   methocarbamol (ROBAXIN) IV       LOS: 8 days    Flora Lipps, MD [facility] Available via Epic secure chat 7am-7pm After these hours, please refer to coverage provider listed on [email or web address] 08/15/2021, 1:32 PM

## 2021-08-16 ENCOUNTER — Encounter (HOSPITAL_COMMUNITY): Payer: Medicare Other

## 2021-08-16 DIAGNOSIS — T847XXD Infection and inflammatory reaction due to other internal orthopedic prosthetic devices, implants and grafts, subsequent encounter: Secondary | ICD-10-CM | POA: Diagnosis not present

## 2021-08-16 DIAGNOSIS — N186 End stage renal disease: Secondary | ICD-10-CM | POA: Diagnosis not present

## 2021-08-16 DIAGNOSIS — S72492A Other fracture of lower end of left femur, initial encounter for closed fracture: Secondary | ICD-10-CM | POA: Diagnosis not present

## 2021-08-16 DIAGNOSIS — M00059 Staphylococcal arthritis, unspecified hip: Secondary | ICD-10-CM | POA: Diagnosis not present

## 2021-08-16 LAB — GLUCOSE, CAPILLARY
Glucose-Capillary: 194 mg/dL — ABNORMAL HIGH (ref 70–99)
Glucose-Capillary: 167 mg/dL — ABNORMAL HIGH (ref 70–99)
Glucose-Capillary: 149 mg/dL — ABNORMAL HIGH (ref 70–99)
Glucose-Capillary: 219 mg/dL — ABNORMAL HIGH (ref 70–99)
Glucose-Capillary: 195 mg/dL — ABNORMAL HIGH (ref 70–99)

## 2021-08-16 LAB — CBC
HCT: 23.2 % — ABNORMAL LOW (ref 36.0–46.0)
Hemoglobin: 7.4 g/dL — ABNORMAL LOW (ref 12.0–15.0)
MCH: 28.8 pg (ref 26.0–34.0)
MCHC: 31.9 g/dL (ref 30.0–36.0)
MCV: 90.3 fL (ref 80.0–100.0)
Platelets: 248 10*3/uL (ref 150–400)
RBC: 2.57 MIL/uL — ABNORMAL LOW (ref 3.87–5.11)
RDW: 14.8 % (ref 11.5–15.5)
WBC: 8.1 10*3/uL (ref 4.0–10.5)
nRBC: 0.2 % (ref 0.0–0.2)

## 2021-08-16 LAB — BASIC METABOLIC PANEL
Anion gap: 9 (ref 5–15)
BUN: 30 mg/dL — ABNORMAL HIGH (ref 6–20)
CO2: 25 mmol/L (ref 22–32)
Calcium: 8 mg/dL — ABNORMAL LOW (ref 8.9–10.3)
Chloride: 89 mmol/L — ABNORMAL LOW (ref 98–111)
Creatinine, Ser: 4.29 mg/dL — ABNORMAL HIGH (ref 0.44–1.00)
GFR, Estimated: 12 mL/min — ABNORMAL LOW (ref >60)
Glucose, Bld: 186 mg/dL — ABNORMAL HIGH (ref 70–99)
Potassium: 4.1 mmol/L (ref 3.5–5.1)
Sodium: 123 mmol/L — ABNORMAL LOW (ref 135–145)

## 2021-08-16 MED ORDER — DOCUSATE SODIUM 100 MG PO CAPS: 100.0000 mg | ORAL_CAPSULE | Freq: Two times a day (BID) | ORAL | 0 refills | Status: AC

## 2021-08-16 MED ORDER — OXYCODONE-ACETAMINOPHEN 7.5-325 MG PO TABS: 1.0000 | ORAL_TABLET | Freq: Four times a day (QID) | ORAL | 0 refills | Status: DC | PRN

## 2021-08-16 MED ORDER — ACETAMINOPHEN 325 MG PO TABS: 650.0000 mg | ORAL_TABLET | Freq: Four times a day (QID) | ORAL | Status: AC | PRN

## 2021-08-16 MED ORDER — ONDANSETRON HCL 4 MG PO TABS: 4.0000 mg | ORAL_TABLET | Freq: Four times a day (QID) | ORAL | 0 refills | Status: DC | PRN

## 2021-08-16 MED ORDER — DARBEPOETIN ALFA 150 MCG/0.3ML IJ SOSY
150.0000 ug | PREFILLED_SYRINGE | INTRAMUSCULAR | Status: DC
  Administered 2021-08-16: 150 ug via INTRAVENOUS
  Filled 2021-08-16 (×2): qty 0.3

## 2021-08-16 MED ORDER — METHOCARBAMOL 500 MG PO TABS: 500.0000 mg | ORAL_TABLET | Freq: Four times a day (QID) | ORAL | Status: AC | PRN

## 2021-08-16 MED ORDER — CEFAZOLIN IV (FOR PTA / DISCHARGE USE ONLY): 2.0000 g | INTRAVENOUS | 0 refills | Status: DC

## 2021-08-16 MED ORDER — INSULIN GLARGINE 100 UNIT/ML ~~LOC~~ SOLN: 10.0000 [IU] | Freq: Every day | SUBCUTANEOUS | Status: DC

## 2021-08-16 NOTE — Discharge Summary (Signed)
Physician Discharge Summary   Patient: [name] MRN: [id number] DOB: October 11, 1965  Admit date:     08/07/2021  Discharge date: 08/16/21  Discharge Physician: Flora Lipps   PCP: Julian Hy, PA-C   Recommendations at discharge:   Follow-up with primary care provider at the skilled nursing facility in 3 to 5 days. Follow-up with Dr. [name] orthopedics in 1 week-postsurgical follow-up. Follow-up with infectious disease Dr. Baxter Flattery in 3 to 4 weeks.  Patient might need suppressive antibiotic treatment as outpatient Continue hemodialysis as outpatient.  Discharge Diagnoses: Principal Problem:   Septic arthritis of hip ([place]) Active Problems:   Skin abscess L hip   Type 2 diabetes mellitus with renal complication (HCC)   ESRD (end stage renal disease) on dialysis [facility])   Essential hypertension   Closed fracture of left distal femur (HCC)   Hardware complicating wound infection ([place])   Subacute osteomyelitis of left femur ([place])  Resolved Problems:   Hypomagnesemia  Hospital Course: [name] is a 56 y.o. female with past medical history significant for hypertension, hyperlipidemia, end-stage renal disease on hemodialysis Monday, Wednesday Friday, history of left BKA 2021, depression/anxiety, history of CVA, type 2 diabetes mellitus and chronic pain syndrome presented to the hospital as transfer from [facility] for distal left femur fracture secondary to mechanical fall.  This was deemed nonoperative as per [facility] orthopedics.  During the hospital course, patient had multifocal pneumonia and completed course of antibiotic on 08/07/2021.  Patient also underwent drainage of superficial abscess in the left hip on 07/28/21, culture was negative.  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  Patient was then admitted hospital for further evaluation and treatment.   Following conditions were  addressed during hospitalization,  Left hip septic arthritis/osteomyelitis, abscess, hardware infection. Seen by orthopedics and infectious disease.   Patient underwent hardware removal of left hip affixes nail with partial resection of tuft tissue and femur that were nonviable by Dr. [name] on 08/10/2021.  Blood cultures have been negative.  Operative cultures from 08/10/2021 with rare Staph aureus plan for 6 weeks of IV antibiotic treatment  with Cefazolin until 09/22/2021 as per infectious disease with hemodialysis..   Acute postoperative blood loss anemia Received 1 unit of packed RBC during hospitalization.  Hemoglobin has remained stable at this time.  Latest hemoglobin of 7.4   Left femoral condyle fracture Nonweightbearing of the left lower extremity, will need continued rehabilitation.   Hyponatremia Latest sodium of 122.  Likely secondary to volume overload.  On hemodialysis.  Nephrology on board and communicated prior to discharge..  At this time no further intervention.   Hypomagnesemia.  Magnesium level of 1.5.  Received magnesium oxide orally.  Type 2 diabetes mellitus, insulin-dependent Latest Hemoglobin A1c 7.1 on 07/22/2021, well controlled.  Long-acting insulin will be resumed including sliding scale on discharge.  Will likely need to closely monitor for hypoglycemia   ESRD on HD MWF Nephrology on board for hemodialysis.  Resume on discharge.   Depression/Anxiety Continue amitriptyline and duloxetine   Essential hypertension Continue amlodipine and Coreg.  On discharge.  Was on hydralazine which has been discontinued at this time   Obesity Body mass index is 32.93 kg/m.  Would benefit from weight loss as outpatient.     Malfunctioning hemodialysis fistula.  Vascular surgery was consulted but patient denied fistulogram at this time.  Consultants:  Orthopedics, Dr. [name] Nephrology Infectious disease Vascular surgery  Procedures performed:  hardware removal of left  hip affixes nail with partial resection of tuft tissue and femur that were nonviable by Dr. [name] on 08/10/2021.  Hemodialysis PRBC transfusion   Disposition: Skilled nursing facility Diet recommendation:  Discharge Diet Orders (From admission, onward)     Start     Ordered   08/16/21 0000  Diet - low sodium heart healthy       Comments: 1500 mL fluid restriction.   08/16/21 1042   08/16/21 0000  Diet Carb Modified        08/16/21 1042           Cardiac and Carb modified diet DISCHARGE MEDICATION: Allergies as of 08/16/2021       Reactions   Trazodone Swelling   Latex Rash   Lidocaine Itching        Medication List     STOP taking these medications    cyclobenzaprine 10 MG tablet Commonly known as: FLEXERIL   D3-50 1.25 MG (50000 UT) capsule Generic drug: Cholecalciferol   hydrALAZINE 100 MG tablet Commonly known as: APRESOLINE   iron sucrose in sodium chloride 0.9 % 100 mL   meropenem 500 mg in sodium chloride 0.9 % 100 mL   pantoprazole 40 MG tablet Commonly known as: PROTONIX   sodium chloride 0.65 % Soln nasal spray Commonly known as: OCEAN       TAKE these medications    acetaminophen 325 MG tablet Commonly known as: TYLENOL Take 2 tablets (650 mg total) by mouth every 6 (six) hours as needed for mild pain (or Fever >/= 101). What changed:  medication strength how much to take reasons to take this   alosetron 1 MG tablet Commonly known as: LOTRONEX Take 1 mg by mouth 2 (two) times daily.   amitriptyline 25 MG tablet Commonly known as: ELAVIL Take 25 mg by mouth at bedtime.   amLODipine 5 MG tablet Commonly known as: NORVASC Take 1 tablet (5 mg total) by mouth daily.   atorvastatin 40 MG tablet Commonly known as: LIPITOR TAKE 1 TABLET (40 MG TOTAL) BY MOUTH DAILY AT 6 PM.   Biotin 10 MG Caps Take 1 capsule by mouth daily.   carvedilol 6.25 MG tablet Commonly known as: COREG Take 1 tablet (6.25 mg total) by mouth 2 (two) times  daily.   ceFAZolin  IVPB Commonly known as: ANCEF Inject 2 g into the vein every Monday, Wednesday, and Friday with hemodialysis. Indication:  MSSA L-hip infection First Dose: Yes Last Day of Therapy:  09/22/21 Labs - Once weekly:  CBC/D and BMP, Labs - Every other week:  ESR and CRP Method of administration: IV Push Method of administration may be changed at the discretion of home infusion pharmacist based upon assessment of the patient and/or caregiver's ability to self-administer the medication ordered.   Cinnamon 500 MG capsule Take 500 mg by mouth 2 (two) times daily.   Dexilant 60 MG capsule Generic drug: dexlansoprazole Take 60 mg by mouth daily.   diphenoxylate-atropine 2.5-0.025 MG tablet Commonly known as: LOMOTIL Take 1 tablet by mouth every 12 (twelve) hours as needed for diarrhea or loose stools.   docusate sodium 100 MG capsule Commonly known as: COLACE Take 1 capsule (100 mg total) by mouth 2 (two) times daily.   DULoxetine 60 MG capsule Commonly known as: CYMBALTA Take 60 mg by mouth daily.   insulin aspart 100 UNIT/ML injection Commonly known as: novoLOG Inject 6 Units into the skin 3 (three) times  daily with meals. What changed: Another medication with the same name was removed. Continue taking this medication, and follow the directions you see here.   insulin aspart 100 UNIT/ML injection Commonly known as: novoLOG Inject 0-5 Units into the skin at bedtime. What changed: Another medication with the same name was removed. Continue taking this medication, and follow the directions you see here.   insulin glargine 100 UNIT/ML injection Commonly known as: LANTUS Inject 0.1 mLs (10 Units total) into the skin at bedtime. What changed: how much to take   methocarbamol 500 MG tablet Commonly known as: ROBAXIN Take 1 tablet (500 mg total) by mouth every 6 (six) hours as needed for up to 5 days for muscle spasms.   Muscle Rub 10-15 % Crea Apply 1 application  topically as needed for muscle pain.   naloxone 4 MG/0.1ML Liqd nasal spray kit Commonly known as: NARCAN 1 spray as directed.   nicotine 21 mg/24hr patch Commonly known as: NICODERM CQ - dosed in mg/24 hours Place 1 patch (21 mg total) onto the skin daily as needed (nicotine craving).   ondansetron 4 MG tablet Commonly known as: ZOFRAN Take 1 tablet (4 mg total) by mouth every 6 (six) hours as needed for nausea.   oxyCODONE-acetaminophen 7.5-325 MG tablet Commonly known as: PERCOCET Take 1 tablet by mouth every 6 (six) hours as needed for moderate pain or severe pain.   polyethylene glycol 17 g packet Commonly known as: MIRALAX / GLYCOLAX Take 17 g by mouth daily as needed for mild constipation.   sevelamer carbonate 800 MG tablet Commonly known as: RENVELA Take 2 tablets (1,600 mg total) by mouth 3 (three) times daily with meals.               Discharge Care Instructions  (From admission, onward)           Start     Ordered   08/16/21 0000  Change dressing on IV access line weekly and PRN  (Home infusion instructions - [facility] )        08/16/21 1027   08/16/21 0000  Discharge wound care:       Comments: Comments: Silicone foam to the left hip wound to absorb exudate, ok to add topper of calcium alginateKellie Simmering # [id number])for excessive drainage.   08/16/21 1042            Follow-up Information     Newt Minion, MD Follow up in 1 week(s).   Specialty: Orthopedic Surgery Contact information: [place] [address] [phone]         Carlyle Basques, MD Follow up in 3 week(s).   Specialty: Infectious Diseases Why: antibiotic followup, possible need for suppressive treatment Contact information: [place] [address] [place] [address] [phone]                Subjective.   Patient was seen and examined at bedside.  Denies any nausea vomiting fever chills or rigor.  No interval  complaints  Discharge Exam: Filed Weights   08/14/21 0854 08/14/21 1307 08/16/21 0928  Weight: 106.9 kg 104.1 kg 107 kg      08/16/2021   11:00 AM 08/16/2021   10:30 AM 08/16/2021   10:00 AM  Vitals with BMI  Systolic [phone]  Diastolic 64 56 62  Pulse 70 64 70   Body mass index is 33.85 kg/m.  General: Obese built, not in obvious distress HENT:  No scleral pallor or icterus noted. Oral mucosa is moist.  Chest:   Diminished breath sounds bilaterally. No crackles or wheezes.  CVS: S1 &S2 heard. No murmur.  Regular rate and rhythm. Abdomen: Soft, nontender, nondistended.  Bowel sounds are heard.   Extremities: Left below-knee amputation, left hip with dressing. Psych: Alert, awake and oriented, normal mood CNS:  No cranial nerve deficits.  Power equal in all extremities.   Skin: Warm and dry.  Left hip with dressing.   Condition at discharge: stable  The results of significant diagnostics from this hospitalization (including imaging, microbiology, ancillary and laboratory) are listed below for reference.   Imaging Studies: CT HIP LEFT WO CONTRAST  Result Date: 08/06/2021 CLINICAL DATA:  Persistent left hip pain. Evaluate healing of left hip fracture. EXAM: CT OF THE LEFT HIP WITHOUT CONTRAST TECHNIQUE: Multidetector CT imaging of the left hip was performed according to the standard protocol. Multiplanar CT image reconstructions were also generated. RADIATION DOSE REDUCTION: This exam was performed according to the departmental dose-optimization program which includes automated exposure control, adjustment of the mA and/or kV according to patient size and/or use of iterative reconstruction technique. COMPARISON:  Radiographs 07/29/2021 and MRI left hip 07/29/2021 FINDINGS: As demonstrated on the recent MRI examination there are ununited left hip fractures with extensive lucency around the hip hardware and extensive abnormal fluid collections around the hip consistent with septic  arthritis and osteomyelitis. Focal posterior fluid collection measures 6 cm and there appears to be a connecting sinus tract to the skin. Associated myofasciitis and cellulitis. The acetabulum appears intact. The left hemipelvic bony structures are also intact. No significant intrapelvic abnormalities are identified. Borderline pelvic and enlarged inguinal lymph nodes again demonstrated. IMPRESSION: 1. Ununited left hip fractures with extensive lucency around the hip hardware consistent with extensive osteomyelitis. 2. 6 cm posterior fluid collection/abscess appears to be a connecting with a sinus tract to the skin. 3. Probable septic arthritis, myofasciitis and cellulitis. 4. Associated myofasciitis and cellulitis. Electronically Signed   By: Marijo Sanes M.D.   On: 08/06/2021 08:58   MR HIP LEFT WO CONTRAST  Result Date: 07/29/2021 CLINICAL DATA:  Left hip septic arthritis suspected. Soft tissue infection suspected. End-stage renal disease on hemodialysis. History of left below-the-knee amputation in 2021. Insulin-dependent diabetes. Fell from transfer from wheelchair recliner about 1 week prior to admission. EXAM: MR OF THE LEFT HIP WITHOUT CONTRAST TECHNIQUE: Multiplanar, multisequence MR imaging was performed. No intravenous contrast was administered. COMPARISON:  Pelvis and left hip radiographs 07/29/2021, 01/19/2020, 12/28/2019; CT abdomen and pelvis 02/07/2020 FINDINGS: Bones: Postsurgical changes are again seen of left cephalomedullary nail fixation of a left proximal femoral intertrochanteric fracture seen acutely on 12/28/2019 radiographs and fixated 12/29/2019. On 07/29/2021 there is increased lucency around the femoral intertrochanteric region and femoral neck portions of the hardware suspicious for hardware loosening. There is decreased T1 and intermediate T2 signal heterogeneous material broadly contacting the posterosuperior aspect of the left proximal femoral intertrochanteric region and  measuring up to approximately 6.6 x 4.2 x 6.4 cm. This material appears to extend from the junction of the superior greater trochanter in the superior tip of the long axis proximal femoral diaphyseal intramedullary rod (axial series 6 images 10 through 12). This is suspicious for complex fluid extending posteriorly from the joint space, and is suspicious for an infectious process. Within the more distal aspect of the hardware where the distal transverse screw fixates the proximal femoral diaphyseal nail, there is increased lucency on 07/29/2021 radiographs,  and similar decreased T1 and intermediate T2 signal heterogeneous material extends posteriorly and laterally from the superior aspect of the proximal nail, measuring up to 3.6 x 4.0 by 3.3 cm suspicious for an additional infected pocket of fluid. There is moderate to high-grade soft tissue edema and swelling throughout the proximal lateral left thigh, greatest around these suspected infectious fluid collections. There also appears to be similar complex fluid material extending laterally from the lateral aspect of the femoral neck nail where there is surrounding lucency within the bone. This complex fluid appears to extend laterally through the proximal lateral hip skin surface. Recommend clinical correlation for a draining sinus tract through the lateral skin surface (coronal series 3, image 11 and axial series 5, image 33). This infected region is in between the two above described more superior and inferior fluid collections respectively. Left hip: Articular cartilage and labrum Articular cartilage: Moderate superior left femoroacetabular cartilage thinning. Labrum: High-grade attenuation of the anterior superior left acetabular labrum. Joint effusion Joint effusion:  No left hip joint effusion is seen. Muscles and tendons Muscles and tendons: Diffuse edema throughout the left gluteus minimus gluteus medius, rectus femoris, and visualized proximal vastus  lateralis and long head of the biceps femoris muscles. More mild edema within the right gluteus minimus greater than gluteus medius muscles. Mild fluid bright signal within the left common hamstring tendon origin, likely a small partial-thickness tear. Trace fluid along the peripheral lateral border of the left rectus abdominis muscle insertion on the pubic symphysis (axial series 5, images 23-29). Other findings Miscellaneous:   None. IMPRESSION: 1. Postsurgical changes are seen of left cephalomedullary nail fixation of a proximal left femoral intertrochanteric fracture seen acutely on 12/28/2019 radiographs. On 07/29/2021 radiographs there is increased lucency around the femoral neck screw and hardware in the intertrochanteric region as well as the distal (proximal femoral diaphyseal) interlocking screw. In these regions including the posterosuperior aspect of the greater trochanter, junction of the femoral neck and longer stem femoral diaphyseal intramedullary nail and junction of the distal interlocking screw and tip distal femoral intramedullary nail, there is complex fluid suspicious for infectious material extending laterally. There appears to be a sinus tract extending to the skin surface within the middle of these 3 infected regions. 2. Nonspecific myositis of the surrounding left hip musculature. This also may represent infection of the muscle tissues. 3. Small partial-thickness tear of the common left hamstring tendon origin. Electronically Signed   By: Yvonne Kendall M.D.   On: 07/29/2021 18:38   DG HIP UNILAT WITH PELVIS 2-3 VIEWS LEFT  Result Date: 07/29/2021 CLINICAL DATA:  Left hip pain.  Old left hip fracture. EXAM: DG HIP (WITH OR WITHOUT PELVIS) 2-3V LEFT COMPARISON:  CT on 02/07/2020 FINDINGS: Intramedullary rod and screw are again seen transfixing an old intertrochanteric left hip fracture. No evidence of acute fracture or dislocation. Generalized osteopenia is noted. IMPRESSION: Old  intertrochanteric left hip fracture. No acute findings. Electronically Signed   By: Marlaine Hind M.D.   On: 07/29/2021 16:37   DG Chest Port 1 View  Result Date: 07/27/2021 CLINICAL DATA:  Difficulty breathing.  No cough.  Leukocytosis. EXAM: PORTABLE CHEST 1 VIEW COMPARISON:  07/21/2021. FINDINGS: Cardiac silhouette is normal in size. No mediastinal or hilar masses. Subtle airspace opacity noted above both hila, greater on the left. Remainder of the lungs is clear. No pleural effusion or pneumothorax. Skeletal structures are grossly intact. IMPRESSION: 1. Subtle airspace opacities extending from the superior hila, left greater than  right suspected to be multifocal pneumonia. Electronically Signed   By: Lajean Manes M.D.   On: 07/27/2021 10:34   DG Chest Port 1 View  Result Date: 07/21/2021 CLINICAL DATA:  Femoral fracture. Medical clearance for surgical repair. EXAM: PORTABLE CHEST 1 VIEW COMPARISON:  None Available. FINDINGS: Lung volumes are small, but are symmetric. Interstitial opacity at the left lung base may represent atelectasis or infiltrate. No pneumothorax or pleural effusion. Cardiac size within normal limits. No acute bone abnormality. IMPRESSION: Pulmonary hypoinflation.  Left basilar atelectasis or infiltrate. Electronically Signed   By: Fidela Salisbury M.D.   On: 07/21/2021 21:15   DG Knee Complete 4 Views Left  Result Date: 07/21/2021 CLINICAL DATA:  Fall, left knee pain EXAM: LEFT KNEE - COMPLETE 4+ VIEW COMPARISON:  None Available. FINDINGS: Prior below the knee amputation. There is a comminuted fracture in the distal left femoral metaphysis. Mildly displaced fracture fragments anteriorly. Diffuse osteopenia. IMPRESSION: Prior below-knee amputation. Comminuted, mildly displaced distal left femoral fracture. Electronically Signed   By: Rolm Baptise M.D.   On: 07/21/2021 18:25    Microbiology: Results for orders placed or performed during the hospital encounter of 08/07/21  Surgical  pcr screen     Status: None   Collection Time: 08/08/21 11:57 PM   Specimen: Nasal Mucosa; Nasal Swab  Result Value Ref Range Status   MRSA, PCR NEGATIVE NEGATIVE Final   Staphylococcus aureus NEGATIVE NEGATIVE Final    Comment: (NOTE) The Xpert SA Assay (FDA approved for NASAL specimens in patients 2 years of age and older), is one component of a comprehensive surveillance program. It is not intended to diagnose infection nor to guide or monitor treatment. Performed at [facility], [place] [address] [place], [place] [address]   Aerobic/Anaerobic Culture w Gram Stain (surgical/deep wound)     Status: None   Collection Time: 08/10/21  8:22 AM   Specimen: Wound; Abscess  Result Value Ref Range Status   Specimen Description TISSUE  Final   Special Requests LEFT HIP WOUND  Final   Gram Stain   Final    RARE WBC PRESENT,BOTH PMN AND MONONUCLEAR NO ORGANISMS SEEN    Culture   Final    RARE STAPHYLOCOCCUS AUREUS CRITICAL RESULT CALLED TO, READ BACK BY AND VERIFIED WITH: RN [name] [id number] FCP NO ANAEROBES ISOLATED Performed at [facility], [place] [address] [place], [place] [address]    Report Status 08/15/2021 FINAL  Final   Organism ID, Bacteria STAPHYLOCOCCUS AUREUS  Final      Susceptibility   Staphylococcus aureus - MIC*    CIPROFLOXACIN <=0.5 SENSITIVE Sensitive     ERYTHROMYCIN <=0.25 SENSITIVE Sensitive     GENTAMICIN <=0.5 SENSITIVE Sensitive     OXACILLIN 0.5 SENSITIVE Sensitive     TETRACYCLINE <=1 SENSITIVE Sensitive     VANCOMYCIN 1 SENSITIVE Sensitive     TRIMETH/SULFA <=10 SENSITIVE Sensitive     CLINDAMYCIN <=0.25 SENSITIVE Sensitive     RIFAMPIN <=0.5 SENSITIVE Sensitive     Inducible Clindamycin NEGATIVE Sensitive     * RARE STAPHYLOCOCCUS AUREUS    Labs: CBC: Recent Labs  Lab 08/11/21 0154 08/11/21 1453 08/12/21 0051 08/12/21 1153 08/13/21 0136 08/14/21 0158 08/14/21 1822 08/15/21 0145 08/16/21 0150  WBC 7.2  --  8.2  --   7.0 9.8  --  8.6 8.1  NEUTROABS 5.5  --   --   --   --   --   --   --   --  HGB 5.8*   < > 7.5*   < > 7.0* 6.8* 7.8* 7.6* 7.4*  HCT 18.3*   < > 22.5*   < > 21.5* 20.8* 24.1* 22.9* 23.2*  MCV 93.4  --  89.3  --  91.5 91.2  --  89.1 90.3  PLT 245  --  223  --  185 240  --  209 248   < > = values in this interval not displayed.   Basic Metabolic Panel: Recent Labs  Lab 08/10/21 0214 08/10/21 1237 08/11/21 0154 08/12/21 0051 08/13/21 0136 08/14/21 0158 08/15/21 0145 08/16/21 0150  NA 130* 126* 123* 119* 123* 120* 122* 123*  K 4.4 4.7 5.1 4.6 3.9 4.3 3.7 4.1  CL 93* 90* 89* 85* 93* 89* 88* 89*  CO2 _0 GLUCOSE 122* 261* 202* 134* 179* 74 87 186*  BUN 24* 31* 37* 45* 31* 39* 23* 30*  CREATININE 3.36* 3.98* 4.66* 5.33* 3.80* 4.84* 3.22* 4.29*  CALCIUM 8.0* 8.0* 8.1* 7.7* 7.1* 7.6* 7.4* 8.0*  MG  --   --   --   --   --  1.5* 1.5*  --   PHOS 3.9 5.4* 5.1* 5.6* 4.0  --   --   --    Liver Function Tests: Recent Labs  Lab 08/10/21 0214 08/10/21 1237 08/11/21 0154 08/12/21 0051 08/13/21 0136  ALBUMIN 2.1* 2.0* 2.1* 2.2* 1.9*   CBG: Recent Labs  Lab 08/15/21 1612 08/15/21 2017 08/15/21 2342 08/16/21 0350 08/16/21 0811  GLUCAP 189* 194* 179* 194* 167*    Discharge time spent: greater than 30 minutes.  Signed: Flora Lipps, MD [facility] 08/16/2021

## 2021-08-16 NOTE — Progress Notes (Addendum)
Advised by CSW that pt is likely going to [place] at d/c. Pt will require a HD clinic in [place]. Neither [facility], able to accept pt at this time. Referral made to [facility] today. Pt had been accepted at [facility] prior to tx to [facility] but that referral was cancelled when pt was tx to [facility]. Awaiting response from [facility]. Update provided to renal PA as well.   Melven Sartorius Renal Navigator [phone]  Addendum at 4:42 pm: Pt has not been accepted or approved for out-pt HD at [facility]. Received a call from nephrologist in [place] [facility] who states that pt must tolerate HD in chair prior to being accepted at clinic. This information was provided to CSW who is going update team. Spoke to renal PA to make her aware that pt will need to have HD in chair on Monday in order for pt to be accepted at a new clinic. Will f/u on pt's progress on Monday.

## 2021-08-16 NOTE — Progress Notes (Signed)
Patient presented to unit at approx. 9am with c/o 10/10 pain in left leg. Asked for pain meds. Advised will start treatment and monitor BP for a bit before giving her meds, educated on reasoning. She agreed and acknowledged plan.

## 2021-08-16 NOTE — Progress Notes (Signed)
Received patient in bed, alert and oriented. Informed consent signed and in chart.  Time tx initiated: 0935  Pre HD weight: 107kg  Pre HD VS:  145/61 BP 02- 96% Resp-13  Time tx completed: 1337  HD treatment completed. Patient tolerated well. Fistula without signs and symptoms of complications. Patient transported back to the room, alert and orient and in no acute distress. Report given to bedside RN.  Total UF removed: 4L  Medication given: Aranesp 141mcg  Post HD VS:   145/57 BP Temp: 99 Resp 12 O2: 99   Post HD weight: 102.1 kg

## 2021-08-16 NOTE — Progress Notes (Signed)
PT Cancellation Note  Patient Details Name: [name] MRN: [id number] DOB: [birth year]   Cancelled Treatment:    Reason Eval/Treat Not Completed: Other (comment).  Declined since pt is going to rehab today.  Follow up if pt is open to doing therapy.   Ramond Dial 08/16/2021, 4:03 PM  Mee Hives, PT PhD Acute Rehab Dept. Number: [place] and [place]

## 2021-08-16 NOTE — Progress Notes (Signed)
PT Cancellation Note  Patient Details Name: [name] MRN: [id number] DOB: 09/22/1965   Cancelled Treatment:    Reason Eval/Treat Not Completed: Patient at procedure or test/unavailable.  Gone to HD, will retry as time and pt allow.   Ramond Dial 08/16/2021, 10:46 AM  Mee Hives, PT PhD Acute Rehab Dept. Number: [place] and [place]

## 2021-08-16 NOTE — Progress Notes (Signed)
[facility] Progress Note   Subjective:  Seen on HD - 4L UFG and tolerating fine for now. Did have a little issues with cannulation today again, will need fistulogram which can be done as outpatient. She remains edematous, but no dyspnea.  Objective Vitals:   08/16/21 0928 08/16/21 0935 08/16/21 1000 08/16/21 1030  BP: (!) 164/61 (!) 145/61 (!) 174/62 (!) 141/56  Pulse: 67 67 70 64  Resp: 17 13 13 12   Temp: 98 F (36.7 C)     TempSrc: Oral     SpO2: 98% 96% 98% 95%  Weight: 107 kg     Height:       Physical Exam General: Well appearing, NAD. Room air. Heart: RRR; no murmur Lungs: CTA anteriorly Abdomen: soft Extremities: 2+ RLE edema, L BKA bandaged Dialysis Access: AVF + bruit  Additional Objective Labs: Basic Metabolic Panel: Recent Labs  Lab 08/11/21 0154 08/12/21 0051 08/13/21 0136 08/14/21 0158 08/15/21 0145 08/16/21 0150  NA 123* 119* 123* 120* 122* 123*  K 5.1 4.6 3.9 4.3 3.7 4.1  CL 89* 85* 93* 89* 88* 89*  CO2 25 25 24 25 26 25   GLUCOSE 202* 134* 179* 74 87 186*  BUN 37* 45* 31* 39* 23* 30*  CREATININE 4.66* 5.33* 3.80* 4.84* 3.22* 4.29*  CALCIUM 8.1* 7.7* 7.1* 7.6* 7.4* 8.0*  PHOS 5.1* 5.6* 4.0  --   --   --    Liver Function Tests: Recent Labs  Lab 08/11/21 0154 08/12/21 0051 08/13/21 0136  ALBUMIN 2.1* 2.2* 1.9*   CBC: Recent Labs  Lab 08/11/21 0154 08/11/21 1453 08/12/21 0051 08/12/21 1153 08/13/21 0136 08/14/21 0158 08/14/21 1822 08/15/21 0145 08/16/21 0150  WBC 7.2  --  8.2  --  7.0 9.8  --  8.6 8.1  NEUTROABS 5.5  --   --   --   --   --   --   --   --   HGB 5.8*   < > 7.5*   < > 7.0* 6.8* 7.8* 7.6* 7.4*  HCT 18.3*   < > 22.5*   < > 21.5* 20.8* 24.1* 22.9* 23.2*  MCV 93.4  --  89.3  --  91.5 91.2  --  89.1 90.3  PLT 245  --  223  --  185 240  --  209 248   < > = values in this interval not displayed.   Blood Culture    Component Value Date/Time   SDES TISSUE 08/10/2021 0822   SPECREQUEST LEFT HIP WOUND  08/10/2021 0822   CULT  08/10/2021 1696    RARE STAPHYLOCOCCUS AUREUS CRITICAL RESULT CALLED TO, READ BACK BY AND VERIFIED WITH: RN [name] [id number] FCP NO ANAEROBES ISOLATED Performed at [facility], [place] [address] [place], [place] [address]    REPTSTATUS 08/15/2021 FINAL 08/10/2021 4235   Medications:  sodium chloride 10 mL/hr at 08/12/21 1629    ceFAZolin (ANCEF) IV Stopped (08/14/21 1900)   methocarbamol (ROBAXIN) IV      amitriptyline  25 mg Oral QHS   amLODipine  5 mg Oral Daily   atorvastatin  40 mg Oral q1800   carvedilol  6.25 mg Oral BID   Chlorhexidine Gluconate Cloth  6 each Topical Q0600   docusate sodium  100 mg Oral BID   DULoxetine  60 mg Oral Daily   insulin aspart  0-6 Units Subcutaneous Q4H   magnesium oxide  400 mg Oral BID   metroNIDAZOLE  500 mg  Oral Q12H   pantoprazole  40 mg Oral Daily    Dialysis Orders: [facility]-MWF 4hrs; BFR 400; DFR 500; 2K/2Ca; EDW 83kg   Assessment/Plan: 1.  Left hip infection with hardware involvement and recent left femoral condyle fracture: S/p L hip nail removal, partial resection of L femur for osteo, and resection of infected soft tissue. Cx + MSSA. Will need Cefazolin 2g IV q HD until 09/22/21. Also on PO metronidazole BID. 2.  End-stage renal disease: Continue HD per usual MWF schedule - HD today. Cannulation issues again -> see below. 3.  Hypertension: BP high and she is quite edematous with hypoNa. 4L UFG today, will continue to [facility] for volume offloading. Bed weight WAY above her prior dry weight, likely inaccurate.   4.  Anemia: Hgb 7.4 (low). ESA last given 6/8, ^ today's dose. 5.  Secondary hyperparathyroidism:  CorrCa/Phos ok. ?No binders. 6. Nutrition: Alb very low, adding supplements. 7. AVF issues: RN reports difficulty cannulating, high arterial pressures and unable to reach BFR >200. Consulted [facility] who recommended fistulogram, but patient prefers to wait because she had a recent  fistulogram and denies any issues with cannulation outpatient. Now having issues again, but ready for discharge today or this weekend. Outpatient fistulogram arranged at [facility] on Tuesday 6/20 at 1pm. Will need transportation arranged for this appt.  Veneta Penton, PA-C 08/16/2021, 10:36 AM  [facility]

## 2021-08-16 NOTE — TOC Progression Note (Signed)
Transition of Care [facility]) - Progression Note    Patient Details  Name: [name] MRN: [id number] Date of Birth: 1965-11-20  [facility] CM/SW Contact  Joanne Chars, LCSW Phone Number: 08/16/2021, 1:51 PM  Clinical Narrative:   CSW received phone call from Will that bed has opened up for tomorrow.  CSW spoke with [facility] and [facility], neither can offer beds.  CSW spoke with pt on HD unit, updated her on offers, she would like to accept [facility].  Per Heather/Crystal they can accept her tomorrow.  CSW spoke with Tracy/Renal navigator and she will work on HD for pt in [place].      Expected Discharge Plan: Skilled Nursing Facility Barriers to Discharge: Continued Medical Work up  Expected Discharge Plan and Services Expected Discharge Plan: [place] In-house Referral: Clinical Social Work Discharge Planning Services: CM Consult Post Acute Care Choice: [place] arrangements for the past 2 months: Single Family Home Expected Discharge Date: 08/16/21                                     Social Determinants of Health (SDOH) Interventions    Readmission Risk Interventions    07/26/2021    3:15 PM  Readmission Risk Prevention Plan  Transportation Screening Complete  PCP or Specialist Appt within 3-5 Days Complete  HRI or Home Care Consult Complete  Palliative Care Screening Not Applicable  Medication Review (RN Care Manager) Referral to Pharmacy

## 2021-08-17 DIAGNOSIS — N186 End stage renal disease: Secondary | ICD-10-CM | POA: Diagnosis not present

## 2021-08-17 DIAGNOSIS — M00059 Staphylococcal arthritis, unspecified hip: Secondary | ICD-10-CM | POA: Diagnosis not present

## 2021-08-17 DIAGNOSIS — T847XXD Infection and inflammatory reaction due to other internal orthopedic prosthetic devices, implants and grafts, subsequent encounter: Secondary | ICD-10-CM | POA: Diagnosis not present

## 2021-08-17 DIAGNOSIS — S72492A Other fracture of lower end of left femur, initial encounter for closed fracture: Secondary | ICD-10-CM | POA: Diagnosis not present

## 2021-08-17 LAB — GLUCOSE, CAPILLARY
Glucose-Capillary: 175 mg/dL — ABNORMAL HIGH (ref 70–99)
Glucose-Capillary: 185 mg/dL — ABNORMAL HIGH (ref 70–99)
Glucose-Capillary: 193 mg/dL — ABNORMAL HIGH (ref 70–99)
Glucose-Capillary: 195 mg/dL — ABNORMAL HIGH (ref 70–99)
Glucose-Capillary: 202 mg/dL — ABNORMAL HIGH (ref 70–99)
Glucose-Capillary: 209 mg/dL — ABNORMAL HIGH (ref 70–99)

## 2021-08-17 MED ORDER — PROSOURCE PLUS PO LIQD
30.0000 mL | Freq: Two times a day (BID) | ORAL | Status: DC
  Administered 2021-08-17 – 2021-08-23 (×10): 30 mL via ORAL
  Filled 2021-08-17 (×10): qty 30

## 2021-08-17 NOTE — Progress Notes (Signed)
Physical Therapy Treatment Patient Details Name: [name] MRN: [id number] DOB: 01/14/1966 Today's Date: 08/17/2021   History of Present Illness Pt is a 56 y.o. F who presents 08/07/2021 with distal left femur fracture secondary to mechanical fall; deemed nonoperative. During the hospital course, patient had multifocal pneumonia and completed course of antibiotic on 08/07/2021.  Patient also underwent drainage of superficial abscess in the left hip on 07/28/21, culture was negative.  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  Patient underwent hardware removal on 08/10/2021. Significant PMH: HTN, HLD, ESRD, left BKA 2021, CVA, DM2, chronic pain syndrome.    PT Comments    Pt is progressing towards her physical therapy goals; agreeable to trial transfer out of bed this session, however, refusing to sit up in chair. Pt requiring two person maximal assist for squat pivot and lateral scoot transfer from bed <> chair. Continues with generalized weakness, increased LLE drainage, pain. Continue to recommend SNF for ongoing Physical Therapy.      Recommendations for follow up therapy are one component of a multi-disciplinary discharge planning process, led by the attending physician.  Recommendations may be updated based on patient status, additional functional criteria and insurance authorization.  Follow Up Recommendations  Skilled nursing-short term rehab (<3 hours/day)     Assistance Recommended at Discharge Frequent or constant Supervision/Assistance  Patient can return home with the following A lot of help with walking and/or transfers;A lot of help with bathing/dressing/bathroom;Assistance with cooking/housework;Direct supervision/assist for medications management;Direct supervision/assist for financial management;Assist for transportation;Help with stairs or ramp for entrance   Equipment  Recommendations  None recommended by PT    Recommendations for Other Services       Precautions / Restrictions Precautions Precautions: Fall;Other (comment) Precaution Comments: L BKA (baseline), increased drainage from LLE wound Restrictions Weight Bearing Restrictions: Yes LLE Weight Bearing: Non weight bearing     Mobility  Bed Mobility Overal bed mobility: Needs Assistance Bed Mobility: Supine to Sit, Sit to Supine Rolling: Supervision   Supine to sit: Min guard Sit to supine: Min assist   General bed mobility comments: Min guard for transition to sitting on right side of bed, minA for LE assist back into bed    Transfers Overall transfer level: Needs assistance Equipment used: None Transfers: Bed to chair/wheelchair/BSC       Squat pivot transfers: Max assist, +2 physical assistance    Lateral/Scoot Transfers: Max assist, +2 physical assistance General transfer comment: Pt performed squat pivot to recliner and then lateral scoot back to bed, both with maxA + 2 going towards right side. Use of bed pad to assist hips; difficulty achieving much hip clearance    Ambulation/Gait                   Stairs             Wheelchair Mobility    Modified Rankin (Stroke Patients Only)       Balance Overall balance assessment: Needs assistance Sitting-balance support: Feet supported Sitting balance-Leahy Scale: Fair                                      Cognition Arousal/Alertness: Awake/alert Behavior During Therapy: WFL for tasks assessed/performed Overall Cognitive Status: Within Functional Limits for tasks assessed  Exercises      General Comments        Pertinent Vitals/Pain Pain Assessment Pain Assessment: Faces Faces Pain Scale: Hurts even more Pain Location: L knee Pain Descriptors / Indicators: Discomfort, Sore Pain Intervention(s): Limited activity within  patient's tolerance, Monitored during session    Home Living                          Prior Function            PT Goals (current goals can now be found in the care plan section) Acute Rehab PT Goals Patient Stated Goal: less pain with movement Potential to Achieve Goals: Fair Progress towards PT goals: Progressing toward goals    Frequency    Min 3X/week      PT Plan Current plan remains appropriate    Co-evaluation              AM-PAC PT "6 Clicks" Mobility   Outcome Measure  Help needed turning from your back to your side while in a flat bed without using bedrails?: A Little Help needed moving from lying on your back to sitting on the side of a flat bed without using bedrails?: A Little Help needed moving to and from a bed to a chair (including a wheelchair)?: Total Help needed standing up from a chair using your arms (e.g., wheelchair or bedside chair)?: Total Help needed to walk in hospital room?: Total Help needed climbing 3-5 steps with a railing? : Total 6 Click Score: 10    End of Session Equipment Utilized During Treatment: Gait belt Activity Tolerance: Patient tolerated treatment well Patient left: in bed;with call bell/phone within reach;with bed alarm set Nurse Communication: Mobility status;Other (comment) (increased LLE drainage) PT Visit Diagnosis: Other abnormalities of gait and mobility (R26.89);Muscle weakness (generalized) (M62.81);History of falling (Z91.81);Pain Pain - Right/Left: Left Pain - part of body: Leg     Time: 1230-1303 PT Time Calculation (min) (ACUTE ONLY): 33 min  Charges:  $Therapeutic Activity: 23-37 mins                     Wyona Almas, PT, DPT Acute Rehabilitation Services Office [phone]    Deno Etienne 08/17/2021, 1:34 PM

## 2021-08-17 NOTE — Progress Notes (Signed)
PROGRESS NOTE    [name]  VEH:[id number] DOB: 1965-09-21 DOA: 08/07/2021 PCP: Julian Hy, PA-C    Brief Narrative:   [name] is a 56 y.o. female with past medical history significant for hypertension, hyperlipidemia, end-stage renal disease on hemodialysis Monday, Wednesday Friday, history of left BKA 2021, depression/anxiety, history of CVA, type 2 diabetes mellitus and chronic pain syndrome presented to the hospital as transfer from [facility] for distal left femur fracture secondary to mechanical fall.  This was deemed nonoperative as per [facility] orthopedics.  During the hospital course, patient had multifocal pneumonia and completed course of antibiotic on 08/07/2021.  Patient also underwent drainage of superficial abscess in the left hip on 07/28/21, culture was negative.  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  Patient underwent hardware removal on 08/10/2021.  Patient has been treated with antibiotic and dressing at this time.   Plan for 6 weeks of IV antibiotic treatment with HD,  using 6/11 as day 1.  Assessment & Plan:  Principal Problem:   Septic arthritis of hip ([place]) Active Problems:   Skin abscess L hip   Type 2 diabetes mellitus with renal complication (HCC)   ESRD (end stage renal disease) on dialysis [facility])   Essential hypertension   Closed fracture of left distal femur (HCC)   Hardware complicating wound infection ([place])   Subacute osteomyelitis of left femur (HCC)   Left hip septic arthritis/osteomyelitis, abscess, hardware infection. Seen by orthopedics and infectious disease.   Patient underwent hardware removal of left hip affixes nail with partial resection of tuft tissue and femur that were nonviable by Dr. [name] on 08/10/2021.  Blood cultures negative so far.  Operative cultures without organism.  Plan for 6 weeks of IV antibiotic treatment  with Cefazolin until  09/22/2021 as per infectious disease.  Acute postoperative blood loss anemia Hemoglobin today at 7.4 after PRBC transfusion of 1 unit during hospitalization.  Patient had recently received  2 units of packed RBC on 08/11/2021. Patient had recently received ESA on 6/ 8.  Monitor CBC in AM.  Left femoral condyle fracture Nonweightbearing of the left lower extremity, continue pain control.  Awaiting for skilled nursing facility placement.  Hyponatremia Latest sodium of 12.  On hemodialysis.  We will continue to monitor.  Nephrology on board.  Hypomagnesemia.  Replenished.  Was in AM.  Type 2 diabetes mellitus, insulin-dependent Latest Hemoglobin A1c 7.1 on 07/22/2021, well controlled.  Long-acting insulin on hold.  Continue sliding scale insulin.  Latest POC glucose of 209.  ESRD on HD MWF Nephrology on board for hemodialysis.  Patient is still unable to sit and tolerate 3 hours of hemodialysis as outpatient.  Depression/Anxiety Continue amitriptyline and duloxetine  Essential hypertension Continue amlodipine and Coreg.  Blood pressure is elevated.  We will resume hydralazine blood pressure seems to be stable at this time.  Obesity Body mass index is 32.3 kg/m.   Would benefit from weight loss as outpatient.    Malfunctioning hemodialysis fistula.  Vascular surgery was consulted but patient denied fistulogram at this time.  DVT prophylaxis: SCDs Start: 08/10/21 1056 SCDs Start: 08/07/21 2226    Code Status: Full Code  Family Communication:  Communicated with the patient at bedside.  Disposition Plan: Skilled nursing facility, awaiting for hemodialysis set up as outpatient,  Level of care: Med-Surg  Status is: Inpatient  Remains inpatient appropriate because: IV antibiotics, , SNF  placement, awaiting outpatient hemodialysis set up.   Consultants:  Orthopedics, Dr. [name] Nephrology Infectious disease Vascular surgery  Procedures:  hardware removal of left hip affixes  nail with partial resection of tuft tissue and femur that were nonviable by Dr. [name] on 08/10/2021.  Hemodialysis PRBC transfusion  Antimicrobials:  Cefazolin 08/14/2021  Subjective: Today, patient was seen and examined at bedside.  Denies any fever chills nausea vomiting abdominal pain.  No interval complaints.    Objective: Vitals:   08/16/21 1700 08/16/21 2059 08/17/21 0436 08/17/21 0800  BP:  (!) 145/54 (!) 148/56 (!) 150/55  Pulse:  64 66 63  Resp: 18  16 15   Temp:  98.2 F (36.8 C) 98.1 F (36.7 C) 98 F (36.7 C)  TempSrc:  Oral Oral Oral  SpO2:  97% 100% 98%  Weight:      Height:        Intake/Output Summary (Last 24 hours) at 08/17/2021 0823 Last data filed at 08/16/2021 1700 Gross per 24 hour  Intake 720 ml  Output --  Net 720 ml     Filed Weights   08/14/21 1307 08/16/21 0928 08/16/21 1338  Weight: 104.1 kg 107 kg 102.1 kg   Body mass index is 32.3 kg/m.   Physical examination:  General: Obese built, not in obvious distress, chronically ill HENT:   No scleral pallor or icterus noted. Oral mucosa is moist.  Chest:  .  Diminished breath sounds bilaterally. No crackles or wheezes.  CVS: S1 &S2 heard. No murmur.  Regular rate and rhythm. Abdomen: Soft, nontender, nondistended.  Bowel sounds are heard.   Extremities: Left below-knee amputation, left hip with dressing. Psych: Alert, awake and oriented, normal mood CNS:  No cranial nerve deficits.  Skin: Left hip with dressing.   Data Reviewed: I have personally reviewed following labs and imaging studies  CBC: Recent Labs  Lab 08/11/21 0154 08/11/21 1453 08/12/21 0051 08/12/21 1153 08/13/21 0136 08/14/21 0158 08/14/21 1822 08/15/21 0145 08/16/21 0150  WBC 7.2  --  8.2  --  7.0 9.8  --  8.6 8.1  NEUTROABS 5.5  --   --   --   --   --   --   --   --   HGB 5.8*   < > 7.5*   < > 7.0* 6.8* 7.8* 7.6* 7.4*  HCT 18.3*   < > 22.5*   < > 21.5* 20.8* 24.1* 22.9* 23.2*  MCV 93.4  --  89.3  --  91.5 91.2   --  89.1 90.3  PLT 245  --  223  --  185 240  --  209 248   < > = values in this interval not displayed.    Basic Metabolic Panel: Recent Labs  Lab 08/10/21 1237 08/11/21 0154 08/12/21 0051 08/13/21 0136 08/14/21 0158 08/15/21 0145 08/16/21 0150  NA 126* 123* 119* 123* 120* 122* 123*  K 4.7 5.1 4.6 3.9 4.3 3.7 4.1  CL 90* 89* 85* 93* 89* 88* 89*  CO2 25 25 25 24 25 26 25   GLUCOSE 261* 202* 134* 179* 74 87 186*  BUN 31* 37* 45* 31* 39* 23* 30*  CREATININE 3.98* 4.66* 5.33* 3.80* 4.84* 3.22* 4.29*  CALCIUM 8.0* 8.1* 7.7* 7.1* 7.6* 7.4* 8.0*  MG  --   --   --   --  1.5* 1.5*  --   PHOS 5.4* 5.1* 5.6* 4.0  --   --   --     GFR: Estimated  Creatinine Clearance: 19.4 mL/min (A) (by C-G formula based on SCr of 4.29 mg/dL (H)). Liver Function Tests: Recent Labs  Lab 08/10/21 1237 08/11/21 0154 08/12/21 0051 08/13/21 0136  ALBUMIN 2.0* 2.1* 2.2* 1.9*    No results for input(s): "LIPASE", "AMYLASE" in the last 168 hours. No results for input(s): "AMMONIA" in the last 168 hours. Coagulation Profile: No results for input(s): "INR", "PROTIME" in the last 168 hours. Cardiac Enzymes: No results for input(s): "CKTOTAL", "CKMB", "CKMBINDEX", "TROPONINI" in the last 168 hours. BNP (last 3 results) No results for input(s): "PROBNP" in the last 8760 hours. HbA1C: No results for input(s): "HGBA1C" in the last 72 hours. CBG: Recent Labs  Lab 08/16/21 1642 08/16/21 2114 08/17/21 0015 08/17/21 0433 08/17/21 0735  GLUCAP 219* 195* 175* 202* 209*    Lipid Profile: No results for input(s): "CHOL", "HDL", "LDLCALC", "TRIG", "CHOLHDL", "LDLDIRECT" in the last 72 hours. Thyroid Function Tests: No results for input(s): "TSH", "T4TOTAL", "FREET4", "T3FREE", "THYROIDAB" in the last 72 hours. Anemia Panel: No results for input(s): "VITAMINB12", "FOLATE", "FERRITIN", "TIBC", "IRON", "RETICCTPCT" in the last 72 hours.  Sepsis Labs: No results for input(s): "PROCALCITON", "LATICACIDVEN"  in the last 168 hours.  Recent Results (from the past 240 hour(s))  Surgical pcr screen     Status: None   Collection Time: 08/08/21 11:57 PM   Specimen: Nasal Mucosa; Nasal Swab  Result Value Ref Range Status   MRSA, PCR NEGATIVE NEGATIVE Final   Staphylococcus aureus NEGATIVE NEGATIVE Final    Comment: (NOTE) The Xpert SA Assay (FDA approved for NASAL specimens in patients 36 years of age and older), is one component of a comprehensive surveillance program. It is not intended to diagnose infection nor to guide or monitor treatment. Performed at [facility], [place] [address], [place], [place] [address]   Aerobic/Anaerobic Culture w Gram Stain (surgical/deep wound)     Status: None   Collection Time: 08/10/21  8:22 AM   Specimen: Wound; Abscess  Result Value Ref Range Status   Specimen Description TISSUE  Final   Special Requests LEFT HIP WOUND  Final   Gram Stain   Final    RARE WBC PRESENT,BOTH PMN AND MONONUCLEAR NO ORGANISMS SEEN    Culture   Final    RARE STAPHYLOCOCCUS AUREUS CRITICAL RESULT CALLED TO, READ BACK BY AND VERIFIED WITH: RN [name] [id number] FCP NO ANAEROBES ISOLATED Performed at [facility], [place] [address], [place], [place] [address]    Report Status 08/15/2021 FINAL  Final   Organism ID, Bacteria STAPHYLOCOCCUS AUREUS  Final      Susceptibility   Staphylococcus aureus - MIC*    CIPROFLOXACIN <=0.5 SENSITIVE Sensitive     ERYTHROMYCIN <=0.25 SENSITIVE Sensitive     GENTAMICIN <=0.5 SENSITIVE Sensitive     OXACILLIN 0.5 SENSITIVE Sensitive     TETRACYCLINE <=1 SENSITIVE Sensitive     VANCOMYCIN 1 SENSITIVE Sensitive     TRIMETH/SULFA <=10 SENSITIVE Sensitive     CLINDAMYCIN <=0.25 SENSITIVE Sensitive     RIFAMPIN <=0.5 SENSITIVE Sensitive     Inducible Clindamycin NEGATIVE Sensitive     * RARE STAPHYLOCOCCUS AUREUS      Radiology Studies: No results found.    Scheduled Meds:  amitriptyline  25 mg Oral QHS   amLODipine  5  mg Oral Daily   atorvastatin  40 mg Oral q1800   carvedilol  6.25 mg Oral BID   Chlorhexidine Gluconate Cloth  6 each Topical Q0600  darbepoetin (ARANESP) injection - DIALYSIS  150 mcg Intravenous Q Fri-HD   docusate sodium  100 mg Oral BID   DULoxetine  60 mg Oral Daily   insulin aspart  0-6 Units Subcutaneous Q4H   magnesium oxide  400 mg Oral BID   metroNIDAZOLE  500 mg Oral Q12H   pantoprazole  40 mg Oral Daily   Continuous Infusions:  sodium chloride 10 mL/hr at 08/12/21 1629    ceFAZolin (ANCEF) IV 2 g (08/16/21 1750)   methocarbamol (ROBAXIN) IV       LOS: 10 days    Flora Lipps, MD [facility] Available via Epic secure chat 7am-7pm After these hours, please refer to coverage provider listed on [email or web address] 08/17/2021, 8:23 AM

## 2021-08-17 NOTE — Plan of Care (Signed)

## 2021-08-17 NOTE — Progress Notes (Signed)
[facility] Progress Note   Subjective:  Seen in room. No overnight issues, no dyspnea. Dialyzed yesterday with 4L off, still pretty edematous. Discharge delayed as now she will be going to SNF in [place], so HD unit had to be changed. Will need to be dialyzed in chair/recliner on Monday to be accepted into new HD unit. Discussed with patient today. Will see if PT can work on her sitting in recliner this weekend as well. Offered extra HD today for additional volume removal, but she declines.  Objective Vitals:   08/16/21 1700 08/16/21 2059 08/17/21 0436 08/17/21 0800  BP:  (!) 145/54 (!) 148/56 (!) 150/55  Pulse:  64 66 63  Resp: 18  16 15   Temp:  98.2 F (36.8 C) 98.1 F (36.7 C) 98 F (36.7 C)  TempSrc:  Oral Oral Oral  SpO2:  97% 100% 98%  Weight:      Height:       Physical Exam General: Well appearing, NAD. Room air. Heart: RRR; no murmur Lungs: CTA anteriorly Abdomen: soft Extremities: 2+ RLE edema, L BKA bandaged Dialysis Access: AVF + bruit  Additional Objective Labs: Basic Metabolic Panel: Recent Labs  Lab 08/11/21 0154 08/12/21 0051 08/13/21 0136 08/14/21 0158 08/15/21 0145 08/16/21 0150  NA 123* 119* 123* 120* 122* 123*  K 5.1 4.6 3.9 4.3 3.7 4.1  CL 89* 85* 93* 89* 88* 89*  CO2 25 25 24 25 26 25   GLUCOSE 202* 134* 179* 74 87 186*  BUN 37* 45* 31* 39* 23* 30*  CREATININE 4.66* 5.33* 3.80* 4.84* 3.22* 4.29*  CALCIUM 8.1* 7.7* 7.1* 7.6* 7.4* 8.0*  PHOS 5.1* 5.6* 4.0  --   --   --    Liver Function Tests: Recent Labs  Lab 08/11/21 0154 08/12/21 0051 08/13/21 0136  ALBUMIN 2.1* 2.2* 1.9*   CBC: Recent Labs  Lab 08/11/21 0154 08/11/21 1453 08/12/21 0051 08/12/21 1153 08/13/21 0136 08/14/21 0158 08/14/21 1822 08/15/21 0145 08/16/21 0150  WBC 7.2  --  8.2  --  7.0 9.8  --  8.6 8.1  NEUTROABS 5.5  --   --   --   --   --   --   --   --   HGB 5.8*   < > 7.5*   < > 7.0* 6.8* 7.8* 7.6* 7.4*  HCT 18.3*   < > 22.5*   < > 21.5*  20.8* 24.1* 22.9* 23.2*  MCV 93.4  --  89.3  --  91.5 91.2  --  89.1 90.3  PLT 245  --  223  --  185 240  --  209 248   < > = values in this interval not displayed.   Blood Culture    Component Value Date/Time   SDES TISSUE 08/10/2021 0822   SPECREQUEST LEFT HIP WOUND 08/10/2021 0822   CULT  08/10/2021 2297    RARE STAPHYLOCOCCUS AUREUS CRITICAL RESULT CALLED TO, READ BACK BY AND VERIFIED WITH: RN [name] [id number] FCP NO ANAEROBES ISOLATED Performed at [facility], [place] [address] [place], [place] [address]    REPTSTATUS 08/15/2021 FINAL 08/10/2021 3149   Medications:  sodium chloride 10 mL/hr at 08/12/21 1629    ceFAZolin (ANCEF) IV 2 g (08/16/21 1750)   methocarbamol (ROBAXIN) IV      amitriptyline  25 mg Oral QHS   amLODipine  5 mg Oral Daily   atorvastatin  40 mg Oral q1800   carvedilol  6.25 mg Oral BID  Chlorhexidine Gluconate Cloth  6 each Topical Q0600   darbepoetin (ARANESP) injection - DIALYSIS  150 mcg Intravenous Q Fri-HD   docusate sodium  100 mg Oral BID   DULoxetine  60 mg Oral Daily   insulin aspart  0-6 Units Subcutaneous Q4H   magnesium oxide  400 mg Oral BID   metroNIDAZOLE  500 mg Oral Q12H   pantoprazole  40 mg Oral Daily    Dialysis Orders: [facility]-MWF 4hrs; BFR 400; DFR 500; 2K/2Ca; EDW 83kg   Assessment/Plan: 1.  Left hip infection with hardware involvement and recent left femoral condyle fracture: S/p L hip nail removal, partial resection of L femur for osteo, and resection of infected soft tissue. Cx + MSSA. Will need Cefazolin 2g IV q HD until 09/22/21. Also on PO metronidazole BID. 2.  End-stage renal disease: Continue HD per usual MWF schedule - next HD Monday - NEEDS TO DIALYZE IN RECLINER. Cannulation issues again -> see below. 3.  Hypertension: BP high with edema and hypoNa. 4L UF yesterday, offered extra HD today, but she declines. Remains WAY above her prior dry weight, but may be inaccurate with bed weights.    4.  Anemia: Hgb 7.4 (low). Aranesp ^ to 125mcg on 6/16. 5.  Secondary hyperparathyroidism:  CorrCa/Phos ok. ?No binders. 6. Nutrition: Alb very low, continue supplements. 7. AVF issues: RN reports difficulty cannulating, high arterial pressures and unable to reach BFR >200. Consulted [facility]VS who recommended fistulogram, but patient prefered to wait because she had a recent fistulogram and denies any issues with cannulation outpatient. Had issues again on 6/16, outpatient fistulogram arranged at [facility] on Tuesday 6/20 at 1pm. BUT - now will likely remain inpatient at this time, I have asked IR to perform f'gram on Monday if at all possible.  Veneta Penton, PA-C 08/17/2021, 10:54 AM  [facility]

## 2021-08-17 NOTE — TOC Progression Note (Signed)
Transition of Care [facility]) - Progression Note    Patient Details  Name: [name] MRN: [id number] Date of Birth: 08/26/1965  Transition of Care [facility]) CM/SW Contact  Bartholomew Crews, RN Phone Number: [phone] 08/17/2021, 9:00 AM  Clinical Narrative:     Received call back from patient on her cell phone. Discussed barriers of post acute transition. While she does have a bed for today at [facility], outpatient HD has not yet been arranged. Advised that she needs to demonstrate that she is able to sit for HD before outpatient HD offers her a seat. Discussed that she cannot be safely discharged to SNF until she has appropriate outpatient HD arranged. Next HD on Monday - patient will need to be up in chair for HD. Patient verbalized understanding. TOC following for transition needs.   Expected Discharge Plan: Skilled Nursing Facility Barriers to Discharge: Continued Medical Work up  Expected Discharge Plan and Services Expected Discharge Plan: [place] In-house Referral: Clinical Social Work Discharge Planning Services: CM Consult Post Acute Care Choice: [place] arrangements for the past 2 months: Single Family Home Expected Discharge Date: 08/16/21                                     Social Determinants of Health (SDOH) Interventions    Readmission Risk Interventions    07/26/2021    3:15 PM  Readmission Risk Prevention Plan  Transportation Screening Complete  PCP or Specialist Appt within 3-5 Days Complete  HRI or Home Care Consult Complete  Palliative Care Screening Not Applicable  Medication Review (RN Care Manager) Referral to Pharmacy

## 2021-08-18 DIAGNOSIS — S72492A Other fracture of lower end of left femur, initial encounter for closed fracture: Secondary | ICD-10-CM | POA: Diagnosis not present

## 2021-08-18 DIAGNOSIS — T847XXD Infection and inflammatory reaction due to other internal orthopedic prosthetic devices, implants and grafts, subsequent encounter: Secondary | ICD-10-CM | POA: Diagnosis not present

## 2021-08-18 DIAGNOSIS — M00059 Staphylococcal arthritis, unspecified hip: Secondary | ICD-10-CM | POA: Diagnosis not present

## 2021-08-18 DIAGNOSIS — N186 End stage renal disease: Secondary | ICD-10-CM | POA: Diagnosis not present

## 2021-08-18 LAB — GLUCOSE, CAPILLARY
Glucose-Capillary: 173 mg/dL — ABNORMAL HIGH (ref 70–99)
Glucose-Capillary: 175 mg/dL — ABNORMAL HIGH (ref 70–99)
Glucose-Capillary: 179 mg/dL — ABNORMAL HIGH (ref 70–99)
Glucose-Capillary: 180 mg/dL — ABNORMAL HIGH (ref 70–99)
Glucose-Capillary: 180 mg/dL — ABNORMAL HIGH (ref 70–99)
Glucose-Capillary: 181 mg/dL — ABNORMAL HIGH (ref 70–99)
Glucose-Capillary: 184 mg/dL — ABNORMAL HIGH (ref 70–99)
Glucose-Capillary: 197 mg/dL — ABNORMAL HIGH (ref 70–99)

## 2021-08-18 LAB — CBC
HCT: 23.4 % — ABNORMAL LOW (ref 36.0–46.0)
Hemoglobin: 7.6 g/dL — ABNORMAL LOW (ref 12.0–15.0)
MCH: 29.8 pg (ref 26.0–34.0)
MCHC: 32.5 g/dL (ref 30.0–36.0)
MCV: 91.8 fL (ref 80.0–100.0)
Platelets: 246 10*3/uL (ref 150–400)
RBC: 2.55 MIL/uL — ABNORMAL LOW (ref 3.87–5.11)
RDW: 14.6 % (ref 11.5–15.5)
WBC: 9.1 10*3/uL (ref 4.0–10.5)
nRBC: 0 % (ref 0.0–0.2)

## 2021-08-18 LAB — BASIC METABOLIC PANEL
Anion gap: 9 (ref 5–15)
BUN: 29 mg/dL — ABNORMAL HIGH (ref 6–20)
CO2: 24 mmol/L (ref 22–32)
Calcium: 8 mg/dL — ABNORMAL LOW (ref 8.9–10.3)
Chloride: 92 mmol/L — ABNORMAL LOW (ref 98–111)
Creatinine, Ser: 4.57 mg/dL — ABNORMAL HIGH (ref 0.44–1.00)
GFR, Estimated: 11 mL/min — ABNORMAL LOW (ref >60)
Glucose, Bld: 160 mg/dL — ABNORMAL HIGH (ref 70–99)
Potassium: 3.9 mmol/L (ref 3.5–5.1)
Sodium: 125 mmol/L — ABNORMAL LOW (ref 135–145)

## 2021-08-18 MED ORDER — ALOSETRON HCL 1 MG PO TABS
1.0000 mg | ORAL_TABLET | Freq: Two times a day (BID) | ORAL | Status: DC
  Administered 2021-08-18 – 2021-08-23 (×9): 1 mg via ORAL
  Filled 2021-08-18 (×12): qty 1

## 2021-08-18 MED ORDER — HYDRALAZINE HCL 25 MG PO TABS
50.0000 mg | ORAL_TABLET | Freq: Three times a day (TID) | ORAL | Status: DC
  Administered 2021-08-18 – 2021-08-23 (×15): 50 mg via ORAL
  Filled 2021-08-18 (×15): qty 2

## 2021-08-18 NOTE — Progress Notes (Addendum)
PROGRESS NOTE    [name]  YQM:[id number] DOB: [birth year] DOA: 08/07/2021 PCP: Julian Hy, PA-C    Brief Narrative:   [name] is a 56 y.o. female with past medical history significant for hypertension, hyperlipidemia, end-stage renal disease on hemodialysis Monday, Wednesday Friday, history of left BKA 2021, depression/anxiety, history of CVA, type 2 diabetes mellitus and chronic pain syndrome presented to the hospital as transfer from [facility] for distal left femur fracture secondary to mechanical fall.  This was deemed nonoperative as per [facility] orthopedics.  During the hospital course, patient had multifocal pneumonia and completed course of antibiotic on 08/07/2021.  Patient also underwent drainage of superficial abscess in the left hip on 07/28/21, culture was negative.  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  Patient underwent hardware removal on 08/10/2021.  Patient has been treated with antibiotic and dressing at this time.   Plan for 6 weeks of IV antibiotic treatment with HD,  using 6/11 as day 1.  At this time patient is medically stable.  Awaiting for outpatient hemodialysis and has skilled nursing facility.  Assessment & Plan:  Principal Problem:   Septic arthritis of hip ([place]) Active Problems:   Skin abscess L hip   Type 2 diabetes mellitus with renal complication (HCC)   ESRD (end stage renal disease) on dialysis [facility])   Essential hypertension   Closed fracture of left distal femur (HCC)   Hardware complicating wound infection ([place])   Subacute osteomyelitis of left femur (HCC)   Left hip septic arthritis/osteomyelitis, abscess, hardware infection. Seen by orthopedics and infectious disease.   Patient underwent hardware removal of left hip affixes nail with partial resection of tuft tissue and femur that were nonviable by Dr. [name] on 08/10/2021.  Blood cultures  negative so far.  Operative cultures without organism.  Plan for 6 weeks of IV antibiotic treatment  with Cefazolin and oral metronidazole until 09/22/2021 as per infectious disease.  Acute postoperative blood loss anemia Latest hemoglobin of 7.6.  Received total of 3 units of packed RBC during hospitalization.  Receiving ESA with nephrology  Left femoral condyle fracture Nonweightbearing of the left lower extremity,  Awaiting for skilled nursing facility placement.  Hyponatremia Latest sodium of 125.  On hemodialysis.    Hypomagnesemia.  Replenished.    Type 2 diabetes mellitus, insulin-dependent Latest Hemoglobin A1c 7.1 on 07/22/2021,  controlled.  Long-acting insulin on hold.  Continue sliding scale insulin.  Latest POC glucose of 181.  ESRD on HD MWF Nephrology on board for hemodialysis.  Patient is still unable to sit and tolerate 3 hours of hemodialysis as outpatient.  Depression/Anxiety Continue amitriptyline and duloxetine  Essential hypertension Continue amlodipine and Coreg.  Blood pressure is elevated.  Will restart hydralazine at a lower dose 50mg  3 times daily at this time.  We will continue to monitor.   Obesity Body mass index is 32.3 kg/m.   Would benefit from weight loss as outpatient.    Malfunctioning hemodialysis fistula.  Vascular surgery was consulted but patient had denied fistulogram initially.  Willing to consider at this time and will need vascular surgery to assess.  DVT prophylaxis: SCDs Start: 08/10/21 1056 SCDs Start: 08/07/21 2226    Code Status: Full Code  Family Communication:  Spoke with the patient daughter on the phone and updated her about the clinical condition of the patient.  Disposition Plan:  Skilled nursing facility, awaiting  for hemodialysis set up as outpatient, pending tolerance of hemodialysis in chair.  Level of care: Med-Surg  Status is: Inpatient  Remains inpatient appropriate because:  IV antibiotics, , SNF placement,  awaiting outpatient hemodialysis set up.   Consultants:  Orthopedics,  Nephrology Infectious disease Vascular surgery  Procedures:  hardware removal of left hip affixes nail with partial resection of tuft tissue and femur that were nonviable by Dr. [name] on 08/10/2021.  Hemodialysis PRBC transfusion  Antimicrobials:  Cefazolin 08/14/2021  Subjective: Today, patient was seen and examined.  Denies any pain, nausea, vomiting, fever or chills  Objective: Vitals:   08/17/21 2003 08/17/21 2006 08/18/21 0454 08/18/21 0834  BP:  (!) 148/60 (!) 147/54 (!) 163/66  Pulse:  66 64 70  Resp:  18  18  Temp: 98.1 F (36.7 C) 98.1 F (36.7 C) 98.1 F (36.7 C) 98.2 F (36.8 C)  TempSrc:  Oral Oral Oral  SpO2:  95% 98% 97%  Weight:      Height:        Intake/Output Summary (Last 24 hours) at 08/18/2021 1057 Last data filed at 08/18/2021 0600 Gross per 24 hour  Intake 720 ml  Output 2350 ml  Net -1630 ml     Filed Weights   08/14/21 1307 08/16/21 0928 08/16/21 1338  Weight: 104.1 kg 107 kg 102.1 kg   Body mass index is 32.3 kg/m.   Physical examination:  General: Obese built, not in obvious distress, chronically ill HENT:   No scleral pallor or icterus noted. Oral mucosa is moist.  Chest:    Diminished breath sounds bilaterally. No crackles or wheezes.  CVS: S1 &S2 heard. No murmur.  Regular rate and rhythm. Abdomen: Soft, nontender, nondistended.  Bowel sounds are heard.   Extremities: Left below-knee amputation, left hip with dressing Psych: Alert, awake and oriented, normal mood CNS:  No cranial nerve deficits.  Skin: Left hip with dressing.  Data Reviewed: I have personally reviewed following labs and imaging studies  CBC: Recent Labs  Lab 08/13/21 0136 08/14/21 0158 08/14/21 1822 08/15/21 0145 08/16/21 0150 08/18/21 0145  WBC 7.0 9.8  --  8.6 8.1 9.1  HGB 7.0* 6.8* 7.8* 7.6* 7.4* 7.6*  HCT 21.5* 20.8* 24.1* 22.9* 23.2* 23.4*  MCV 91.5 91.2  --  89.1 90.3 91.8   PLT 185 240  --  209 248 324    Basic Metabolic Panel: Recent Labs  Lab 08/12/21 0051 08/13/21 0136 08/14/21 0158 08/15/21 0145 08/16/21 0150 08/18/21 0145  NA 119* 123* 120* 122* 123* 125*  K 4.6 3.9 4.3 3.7 4.1 3.9  CL 85* 93* 89* 88* 89* 92*  CO2 25 24 25 26 25 24   GLUCOSE 134* 179* 74 87 186* 160*  BUN 45* 31* 39* 23* 30* 29*  CREATININE 5.33* 3.80* 4.84* 3.22* 4.29* 4.57*  CALCIUM 7.7* 7.1* 7.6* 7.4* 8.0* 8.0*  MG  --   --  1.5* 1.5*  --   --   PHOS 5.6* 4.0  --   --   --   --     GFR: Estimated Creatinine Clearance: 18.2 mL/min (A) (by C-G formula based on SCr of 4.57 mg/dL (H)). Liver Function Tests: Recent Labs  Lab 08/12/21 0051 08/13/21 0136  ALBUMIN 2.2* 1.9*    No results for input(s): "LIPASE", "AMYLASE" in the last 168 hours. No results for input(s): "AMMONIA" in the last 168 hours. Coagulation Profile: No results for input(s): "INR", "PROTIME" in the last 168 hours. Cardiac Enzymes: No  results for input(s): "CKTOTAL", "CKMB", "CKMBINDEX", "TROPONINI" in the last 168 hours. BNP (last 3 results) No results for input(s): "PROBNP" in the last 8760 hours. HbA1C: No results for input(s): "HGBA1C" in the last 72 hours. CBG: Recent Labs  Lab 08/17/21 1952 08/17/21 2359 08/18/21 0354 08/18/21 0831 08/18/21 0956  GLUCAP 193* 175* 180* 184* 181*    Lipid Profile: No results for input(s): "CHOL", "HDL", "LDLCALC", "TRIG", "CHOLHDL", "LDLDIRECT" in the last 72 hours. Thyroid Function Tests: No results for input(s): "TSH", "T4TOTAL", "FREET4", "T3FREE", "THYROIDAB" in the last 72 hours. Anemia Panel: No results for input(s): "VITAMINB12", "FOLATE", "FERRITIN", "TIBC", "IRON", "RETICCTPCT" in the last 72 hours.  Sepsis Labs: No results for input(s): "PROCALCITON", "LATICACIDVEN" in the last 168 hours.  Recent Results (from the past 240 hour(s))  Surgical pcr screen     Status: None   Collection Time: 08/08/21 11:57 PM   Specimen: Nasal Mucosa;  Nasal Swab  Result Value Ref Range Status   MRSA, PCR NEGATIVE NEGATIVE Final   Staphylococcus aureus NEGATIVE NEGATIVE Final    Comment: (NOTE) The Xpert SA Assay (FDA approved for NASAL specimens in patients 18 years of age and older), is one component of a comprehensive surveillance program. It is not intended to diagnose infection nor to guide or monitor treatment. Performed at [facility], [place] [address] [place], [place] [address]   Aerobic/Anaerobic Culture w Gram Stain (surgical/deep wound)     Status: None   Collection Time: 08/10/21  8:22 AM   Specimen: Wound; Abscess  Result Value Ref Range Status   Specimen Description TISSUE  Final   Special Requests LEFT HIP WOUND  Final   Gram Stain   Final    RARE WBC PRESENT,BOTH PMN AND MONONUCLEAR NO ORGANISMS SEEN    Culture   Final    RARE STAPHYLOCOCCUS AUREUS CRITICAL RESULT CALLED TO, READ BACK BY AND VERIFIED WITH: RN [name] [id number] FCP NO ANAEROBES ISOLATED Performed at [facility], [place] [address] [place], [place] [address]    Report Status 08/15/2021 FINAL  Final   Organism ID, Bacteria STAPHYLOCOCCUS AUREUS  Final      Susceptibility   Staphylococcus aureus - MIC*    CIPROFLOXACIN <=0.5 SENSITIVE Sensitive     ERYTHROMYCIN <=0.25 SENSITIVE Sensitive     GENTAMICIN <=0.5 SENSITIVE Sensitive     OXACILLIN 0.5 SENSITIVE Sensitive     TETRACYCLINE <=1 SENSITIVE Sensitive     VANCOMYCIN 1 SENSITIVE Sensitive     TRIMETH/SULFA <=10 SENSITIVE Sensitive     CLINDAMYCIN <=0.25 SENSITIVE Sensitive     RIFAMPIN <=0.5 SENSITIVE Sensitive     Inducible Clindamycin NEGATIVE Sensitive     * RARE STAPHYLOCOCCUS AUREUS      Radiology Studies: No results found.    Scheduled Meds:  (feeding supplement) PROSource Plus  30 mL Oral BID BM   amitriptyline  25 mg Oral QHS   amLODipine  5 mg Oral Daily   atorvastatin  40 mg Oral q1800   carvedilol  6.25 mg Oral BID   Chlorhexidine Gluconate Cloth   6 each Topical Q0600   darbepoetin (ARANESP) injection - DIALYSIS  150 mcg Intravenous Q Fri-HD   docusate sodium  100 mg Oral BID   DULoxetine  60 mg Oral Daily   insulin aspart  0-6 Units Subcutaneous Q4H   magnesium oxide  400 mg Oral BID   metroNIDAZOLE  500 mg Oral Q12H   pantoprazole  40 mg Oral Daily  Continuous Infusions:  sodium chloride 10 mL/hr at 08/12/21 1629    ceFAZolin (ANCEF) IV 2 g (08/16/21 1750)   methocarbamol (ROBAXIN) IV       LOS: 11 days    Flora Lipps, MD [facility] Available via Epic secure chat 7am-7pm After these hours, please refer to coverage provider listed on [email or web address] 08/18/2021, 10:57 AM

## 2021-08-18 NOTE — Plan of Care (Signed)
  Problem: Coping: Goal: Ability to adjust to condition or change in health will improve Outcome: Progressing   Problem: Fluid Volume: Goal: Ability to maintain a balanced intake and output will improve Outcome: Progressing   Problem: Health Behavior/Discharge Planning: Goal: Ability to manage health-related needs will improve Outcome: Progressing   Problem: Nutritional: Goal: Maintenance of adequate nutrition will improve Outcome: Progressing   Problem: Skin Integrity: Goal: Risk for impaired skin integrity will decrease Outcome: Progressing   Problem: Clinical Measurements: Goal: Respiratory complications will improve Outcome: Progressing   Problem: Nutrition: Goal: Adequate nutrition will be maintained Outcome: Progressing   Problem: Skin Integrity: Goal: Risk for impaired skin integrity will decrease Outcome: Progressing

## 2021-08-18 NOTE — Progress Notes (Signed)
Pharmacy Antibiotic Note  [name] is a 56 y.o. female admitted on 08/07/2021 with L-hip PJI. Pharmacy has been consulted for Cefazolin dosing.  6/10 intra-op cultures growing MSSA - will narrow antibiotics. Last HD 6/12 with Cefepime dose that evening. Will provide coverage until after the patient's next HD session - planned for 6/14.   6/10 L-hip tissue cx >> MSSA, no anaerobes seen. Flagyl day #9 continues. No anaerobes seen in L-hip tissue cx  08/1021. Consider stopping Flagyl.  Plan: Continue Cefazolin 2 G IV qHD-MWF after HD - next dose due 6/19.  Consider stopping Flagyl du to no anaerobes seen in wound culture.  - Will continue to follow HD schedule/duration, culture results, LOT, and antibiotic de-escalation plans   Height: 5\' 10"  (177.8 cm) Weight: 102.1 kg (225 lb 1.4 oz) IBW/kg (Calculated) : 68.5  Temp (24hrs), Avg:98.1 F (36.7 C), Min:98.1 F (36.7 C), Max:98.2 F (36.8 C)  Recent Labs  Lab 08/13/21 0136 08/14/21 0158 08/15/21 0145 08/16/21 0150 08/18/21 0145  WBC 7.0 9.8 8.6 8.1 9.1  CREATININE 3.80* 4.84* 3.22* 4.29* 4.57*     Estimated Creatinine Clearance: 18.2 mL/min (A) (by C-G formula based on SCr of 4.57 mg/dL (H)).    Allergies  Allergen Reactions   Trazodone Swelling   Latex Rash   Lidocaine Itching    Antimicrobials this admission: Azithro 5/27 >> 5/29 Cefepime 5/27 >> 5/29;   6/9>>6/13 Meropenem 5/30 >> 6/8 Cefepime 6/9 >> 6/13 Metronidazole 6/9 >> Vanc 6/11>>6/13 Flagyl 6/9>>  Cefazolin 6/13>>   Microbiology results: 6/10 L-hip tissue cx >> MSSA, no anaerobes seen.   Thank you for allowing pharmacy to be a part of this patient's care.  Nicole Cella, RPh Clinical Pharmacist [phone] 08/18/2021 12:36 PM   **Pharmacist phone directory can now be found on [email or web address] ([email or web address]).  Listed under [place].

## 2021-08-18 NOTE — Progress Notes (Signed)
[facility] Progress Note   Subjective:  Seen in room - no overnight issues. No CP/dyspnea. Tells me sat in a recliner for very short time yesterday. Plan is for dialysis tomorrow morning IN RECLINER - she is aware.  Objective Vitals:   08/17/21 2003 08/17/21 2006 08/18/21 0454 08/18/21 0834  BP:  (!) 148/60 (!) 147/54 (!) 163/66  Pulse:  66 64 70  Resp:  18  18  Temp: 98.1 F (36.7 C) 98.1 F (36.7 C) 98.1 F (36.7 C) 98.2 F (36.8 C)  TempSrc:  Oral Oral Oral  SpO2:  95% 98% 97%  Weight:      Height:       Physical Exam General: Well appearing, NAD. Room air. Heart: RRR; no murmur Lungs: CTA anteriorly Abdomen: soft Extremities: 2+ RLE edema, L BKA bandaged Dialysis Access: AVF + bruit  Additional Objective Labs: Basic Metabolic Panel: Recent Labs  Lab 08/12/21 0051 08/13/21 0136 08/14/21 0158 08/15/21 0145 08/16/21 0150 08/18/21 0145  NA 119* 123*   < > 122* 123* 125*  K 4.6 3.9   < > 3.7 4.1 3.9  CL 85* 93*   < > 88* 89* 92*  CO2 25 24   < > 26 25 24   GLUCOSE 134* 179*   < > 87 186* 160*  BUN 45* 31*   < > 23* 30* 29*  CREATININE 5.33* 3.80*   < > 3.22* 4.29* 4.57*  CALCIUM 7.7* 7.1*   < > 7.4* 8.0* 8.0*  PHOS 5.6* 4.0  --   --   --   --    < > = values in this interval not displayed.   Liver Function Tests: Recent Labs  Lab 08/12/21 0051 08/13/21 0136  ALBUMIN 2.2* 1.9*   CBC: Recent Labs  Lab 08/13/21 0136 08/14/21 0158 08/14/21 1822 08/15/21 0145 08/16/21 0150 08/18/21 0145  WBC 7.0 9.8  --  8.6 8.1 9.1  HGB 7.0* 6.8*   < > 7.6* 7.4* 7.6*  HCT 21.5* 20.8*   < > 22.9* 23.2* 23.4*  MCV 91.5 91.2  --  89.1 90.3 91.8  PLT 185 240  --  209 248 246   < > = values in this interval not displayed.   Medications:  sodium chloride 10 mL/hr at 08/12/21 1629    ceFAZolin (ANCEF) IV 2 g (08/16/21 1750)   methocarbamol (ROBAXIN) IV      (feeding supplement) PROSource Plus  30 mL Oral BID BM   amitriptyline  25 mg Oral QHS    amLODipine  5 mg Oral Daily   atorvastatin  40 mg Oral q1800   carvedilol  6.25 mg Oral BID   Chlorhexidine Gluconate Cloth  6 each Topical Q0600   darbepoetin (ARANESP) injection - DIALYSIS  150 mcg Intravenous Q Fri-HD   docusate sodium  100 mg Oral BID   DULoxetine  60 mg Oral Daily   insulin aspart  0-6 Units Subcutaneous Q4H   magnesium oxide  400 mg Oral BID   metroNIDAZOLE  500 mg Oral Q12H   pantoprazole  40 mg Oral Daily    Dialysis Orders: [facility]-MWF 4hrs; BFR 400; DFR 500; 2K/2Ca; EDW 83kg   Assessment/Plan: 1.  Left hip infection with hardware involvement and recent left femoral condyle fracture: S/p L hip nail removal, partial resection of L femur for osteo, and resection of infected soft tissue. Cx + MSSA. Will need Cefazolin 2g IV q HD until 09/22/21. Also on PO metronidazole  BID. 2.  End-stage renal disease: Continue HD per usual MWF schedule - next HD Monday - NEEDS TO DIALYZE IN RECLINER. Cannulation issues again -> see below. 3.  Hypertension: BP high with edema and hypoNa. 4L UF on 6/17, offered extra HD, but she declines. Remains WAY above her prior dry weight, but may be inaccurate with bed weights.   4.  Anemia: Hgb 7.6 (low). Aranesp ^ to 119mcg on 6/16. 5.  Secondary hyperparathyroidism:  CorrCa/Phos ok. ?No binders. 6. Nutrition: Alb very low, continue supplements. 7. AVF issues: RN reports difficulty cannulating, high arterial pressures and unable to reach BFR >200. Consulted [facility]VS who recommended fistulogram, but patient prefered to wait because she had a recent fistulogram and denies any issues with cannulation outpatient. Had issues again on 6/16, outpatient fistulogram arranged at [facility] on Tuesday 6/20 at 1pm. Now looking like will still be here awaiting SNF/HD placement. Consulted IR, but they request [place] to reach out to [facility] first - will do so.  Veneta Penton, PA-C 08/18/2021, 10:35 AM  [employer]

## 2021-08-19 DIAGNOSIS — T847XXD Infection and inflammatory reaction due to other internal orthopedic prosthetic devices, implants and grafts, subsequent encounter: Secondary | ICD-10-CM | POA: Diagnosis not present

## 2021-08-19 DIAGNOSIS — S72492A Other fracture of lower end of left femur, initial encounter for closed fracture: Secondary | ICD-10-CM | POA: Diagnosis not present

## 2021-08-19 DIAGNOSIS — N186 End stage renal disease: Secondary | ICD-10-CM | POA: Diagnosis not present

## 2021-08-19 DIAGNOSIS — M00059 Staphylococcal arthritis, unspecified hip: Secondary | ICD-10-CM | POA: Diagnosis not present

## 2021-08-19 LAB — CBC
HCT: 24.1 % — ABNORMAL LOW (ref 36.0–46.0)
Hemoglobin: 7.6 g/dL — ABNORMAL LOW (ref 12.0–15.0)
MCH: 29 pg (ref 26.0–34.0)
MCHC: 31.5 g/dL (ref 30.0–36.0)
MCV: 92 fL (ref 80.0–100.0)
Platelets: 287 10*3/uL (ref 150–400)
RBC: 2.62 MIL/uL — ABNORMAL LOW (ref 3.87–5.11)
RDW: 14.7 % (ref 11.5–15.5)
WBC: 11.6 10*3/uL — ABNORMAL HIGH (ref 4.0–10.5)
nRBC: 0 % (ref 0.0–0.2)

## 2021-08-19 LAB — BASIC METABOLIC PANEL
Anion gap: 9 (ref 5–15)
BUN: 42 mg/dL — ABNORMAL HIGH (ref 6–20)
CO2: 27 mmol/L (ref 22–32)
Calcium: 8.3 mg/dL — ABNORMAL LOW (ref 8.9–10.3)
Chloride: 90 mmol/L — ABNORMAL LOW (ref 98–111)
Creatinine, Ser: 5.58 mg/dL — ABNORMAL HIGH (ref 0.44–1.00)
GFR, Estimated: 9 mL/min — ABNORMAL LOW (ref >60)
Glucose, Bld: 155 mg/dL — ABNORMAL HIGH (ref 70–99)
Potassium: 3.9 mmol/L (ref 3.5–5.1)
Sodium: 126 mmol/L — ABNORMAL LOW (ref 135–145)

## 2021-08-19 LAB — GLUCOSE, CAPILLARY
Glucose-Capillary: 166 mg/dL — ABNORMAL HIGH (ref 70–99)
Glucose-Capillary: 153 mg/dL — ABNORMAL HIGH (ref 70–99)
Glucose-Capillary: 163 mg/dL — ABNORMAL HIGH (ref 70–99)
Glucose-Capillary: 275 mg/dL — ABNORMAL HIGH (ref 70–99)
Glucose-Capillary: 241 mg/dL — ABNORMAL HIGH (ref 70–99)

## 2021-08-19 LAB — MAGNESIUM: Magnesium: 1.7 mg/dL (ref 1.7–2.4)

## 2021-08-19 NOTE — Progress Notes (Signed)
Pt Decline to sit up in a recliner, due to not been up only one time.

## 2021-08-19 NOTE — TOC Progression Note (Signed)
Transition of Care [facility]) - Progression Note    Patient Details  Name: [name] MRN: [id number] Date of Birth: 1965/12/14  Transition of [facility] CM/SW Contact  Joanne Chars, LCSW Phone Number: 08/19/2021, 3:32 PM  Clinical Narrative:   CSW spoke with pt regarding issues with sitting in chair for HD. Pt started off by saying she was confused why she had not been transferred to SNF, we discussed that she needs to sit in recliner for HD here before the outpt HD clinic can accept her. CSW asked about her declining to sit for HD today?  Pt reported that "it was early" but also states that she needs to see her ortho MD because she has some questions because she had injured her leg trying to transfer in the past and needs these questions answered before she will try to transfer/sit up for HD.  She is willing to work with PT to sit in the chair tomorrow. Once questions are answered, she will sit for HD.    CSW sent Dr [name] a secure chat requesting he speak with pt when available.      Expected Discharge Plan: Skilled Nursing Facility Barriers to Discharge: Continued Medical Work up  Expected Discharge Plan and Services Expected Discharge Plan: [place] In-house Referral: Clinical Social Work Discharge Planning Services: CM Consult Post Acute Care Choice: [place] arrangements for the past 2 months: Single Family Home Expected Discharge Date: 08/16/21                                     Social Determinants of Health (SDOH) Interventions    Readmission Risk Interventions    07/26/2021    3:15 PM  Readmission Risk Prevention Plan  Transportation Screening Complete  PCP or Specialist Appt within 3-5 Days Complete  HRI or Home Care Consult Complete  Palliative Care Screening Not Applicable  Medication Review (RN Care Manager) Referral to Pharmacy

## 2021-08-19 NOTE — Plan of Care (Signed)
No acute events overnight.    Problem: Education: Goal: Ability to describe self-care measures that may prevent or decrease complications (Diabetes Survival Skills Education) will improve 08/19/2021 0344 by Edgardo Roys, RN Outcome: Progressing 08/19/2021 0344 by Edgardo Roys, RN Outcome: Progressing Goal: Individualized Educational Video(s) 08/19/2021 0344 by Edgardo Roys, RN Outcome: Progressing 08/19/2021 0344 by Edgardo Roys, RN Outcome: Progressing   Problem: Coping: Goal: Ability to adjust to condition or change in health will improve 08/19/2021 0344 by Edgardo Roys, RN Outcome: Progressing 08/19/2021 0344 by Edgardo Roys, RN Outcome: Progressing   Problem: Fluid Volume: Goal: Ability to maintain a balanced intake and output will improve 08/19/2021 0344 by Edgardo Roys, RN Outcome: Progressing 08/19/2021 0344 by Edgardo Roys, RN Outcome: Progressing   Problem: Health Behavior/Discharge Planning: Goal: Ability to identify and utilize available resources and services will improve 08/19/2021 0344 by Edgardo Roys, RN Outcome: Progressing 08/19/2021 0344 by Edgardo Roys, RN Outcome: Progressing Goal: Ability to manage health-related needs will improve 08/19/2021 0344 by Edgardo Roys, RN Outcome: Progressing 08/19/2021 0344 by Edgardo Roys, RN Outcome: Progressing   Problem: Metabolic: Goal: Ability to maintain appropriate glucose levels will improve 08/19/2021 0344 by Edgardo Roys, RN Outcome: Progressing 08/19/2021 0344 by Edgardo Roys, RN Outcome: Progressing   Problem: Nutritional: Goal: Maintenance of adequate nutrition will improve 08/19/2021 0344 by Edgardo Roys, RN Outcome: Progressing 08/19/2021 0344 by Edgardo Roys, RN Outcome: Progressing Goal: Progress toward achieving an optimal weight will improve 08/19/2021 0344 by Edgardo Roys, RN Outcome: Progressing 08/19/2021 0344 by Edgardo Roys, RN Outcome: Progressing   Problem: Skin Integrity: Goal: Risk for impaired  skin integrity will decrease 08/19/2021 0344 by Edgardo Roys, RN Outcome: Progressing 08/19/2021 0344 by Edgardo Roys, RN Outcome: Progressing   Problem: Tissue Perfusion: Goal: Adequacy of tissue perfusion will improve 08/19/2021 0344 by Edgardo Roys, RN Outcome: Progressing 08/19/2021 0344 by Edgardo Roys, RN Outcome: Progressing   Problem: Education: Goal: Knowledge of General Education information will improve Description: Including pain rating scale, medication(s)/side effects and non-pharmacologic comfort measures 08/19/2021 0344 by Edgardo Roys, RN Outcome: Progressing 08/19/2021 0344 by Edgardo Roys, RN Outcome: Progressing   Problem: Health Behavior/Discharge Planning: Goal: Ability to manage health-related needs will improve 08/19/2021 0344 by Edgardo Roys, RN Outcome: Progressing 08/19/2021 0344 by Edgardo Roys, RN Outcome: Progressing   Problem: Clinical Measurements: Goal: Ability to maintain clinical measurements within normal limits will improve 08/19/2021 0344 by Edgardo Roys, RN Outcome: Progressing 08/19/2021 0344 by Edgardo Roys, RN Outcome: Progressing Goal: Will remain free from infection 08/19/2021 0344 by Edgardo Roys, RN Outcome: Progressing 08/19/2021 0344 by Edgardo Roys, RN Outcome: Progressing Goal: Diagnostic test results will improve 08/19/2021 0344 by Edgardo Roys, RN Outcome: Progressing 08/19/2021 0344 by Edgardo Roys, RN Outcome: Progressing Goal: Respiratory complications will improve 08/19/2021 0344 by Edgardo Roys, RN Outcome: Progressing 08/19/2021 0344 by Edgardo Roys, RN Outcome: Progressing Goal: Cardiovascular complication will be avoided 08/19/2021 0344 by Edgardo Roys, RN Outcome: Progressing 08/19/2021 0344 by Edgardo Roys, RN Outcome: Progressing   Problem: Activity: Goal: Risk for activity intolerance will decrease 08/19/2021 0344 by Edgardo Roys, RN Outcome: Progressing 08/19/2021 0344 by Edgardo Roys, RN Outcome: Progressing    Problem: Nutrition: Goal: Adequate nutrition will be maintained 08/19/2021 0344 by Edgardo Roys, RN Outcome: Progressing 08/19/2021 0344 by Edgardo Roys, RN Outcome: Progressing   Problem: Coping: Goal: Level of anxiety will decrease 08/19/2021 0344 by Edgardo Roys, RN Outcome: Progressing 08/19/2021 0344 by Edgardo Roys, RN Outcome: Progressing   Problem: Elimination: Goal: Will not experience  complications related to bowel motility 08/19/2021 0344 by Edgardo Roys, RN Outcome: Progressing 08/19/2021 0344 by Edgardo Roys, RN Outcome: Progressing Goal: Will not experience complications related to urinary retention 08/19/2021 0344 by Edgardo Roys, RN Outcome: Progressing 08/19/2021 0344 by Edgardo Roys, RN Outcome: Progressing   Problem: Pain Managment: Goal: General experience of comfort will improve 08/19/2021 0344 by Edgardo Roys, RN Outcome: Progressing 08/19/2021 0344 by Edgardo Roys, RN Outcome: Progressing   Problem: Safety: Goal: Ability to remain free from injury will improve 08/19/2021 0344 by Edgardo Roys, RN Outcome: Progressing 08/19/2021 0344 by Edgardo Roys, RN Outcome: Progressing   Problem: Skin Integrity: Goal: Risk for impaired skin integrity will decrease 08/19/2021 0344 by Edgardo Roys, RN Outcome: Progressing 08/19/2021 0344 by Edgardo Roys, RN Outcome: Progressing

## 2021-08-19 NOTE — Procedures (Signed)
Seen and examined on dialysis.  Blood pressure 150/60 and HR 68. LUE AVF in use.  Tolerating goal.  She refused HD in a chair today as she's only been up once.  Discussed to please do HD in a chair on Wednesday.   Claudia Desanctis, MD 08/19/2021 8:11 AM

## 2021-08-19 NOTE — Progress Notes (Signed)
Contacted [facility] admissions this morning to get update on pt's referral. [facility] will not proceed with referral until it can be documneted that pt can tolerate HD in chair. Pt was to have HD in chair today but pt refused. Will provide update to [facility] admissions once pt tolerates HD in chair. Update provided to [facility] staff via secure chat.   Melven Sartorius Renal Navigator [phone]

## 2021-08-19 NOTE — Progress Notes (Signed)
PROGRESS NOTE    [name]  RXV:[id number] DOB: 1965/05/09 DOA: 08/07/2021 PCP: Julian Hy, PA-C    Brief Narrative:   [name] is a 56 y.o. female with past medical history significant for hypertension, hyperlipidemia, end-stage renal disease on hemodialysis Monday, Wednesday Friday, history of left BKA 2021, depression/anxiety, history of CVA, type 2 diabetes mellitus and chronic pain syndrome presented to the hospital as transfer from [facility] for distal left femur fracture secondary to mechanical fall.  This was deemed nonoperative as per [facility] orthopedics.  During the hospital course, patient had multifocal pneumonia and completed course of antibiotic on 08/07/2021.  Patient also underwent drainage of superficial abscess in the left hip on 07/28/21, culture was negative.  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  Patient underwent hardware removal on 08/10/2021.  Patient has been treated with antibiotic and dressing at this time.   Plan for 6 weeks of IV antibiotic treatment with HD,  using 6/11 as day 1.  At this time patient is medically stable.  Awaiting for outpatient hemodialysis and placement at skilled nursing facility.  Assessment & Plan:  Principal Problem:   Septic arthritis of hip ([place]) Active Problems:   Skin abscess L hip   Type 2 diabetes mellitus with renal complication (HCC)   ESRD (end stage renal disease) on dialysis [facility])   Essential hypertension   Closed fracture of left distal femur (HCC)   Hardware complicating wound infection ([place])   Subacute osteomyelitis of left femur (HCC)   Left hip septic arthritis/osteomyelitis, abscess, hardware infection. Seen by orthopedics and infectious disease.   Patient underwent hardware removal of left hip affixes nail with partial resection of tuft tissue and femur that were nonviable by Dr. [name] on 08/10/2021.  Blood  cultures negative so far.  Operative cultures without organism.  Plan for 6 weeks of IV antibiotic treatment  with Cefazolin until 09/22/2021 as per infectious disease.  WBC at 11.6.  Acute postoperative blood loss anemia Latest hemoglobin of 7.6.  Received total of 3 units of packed RBC during hospitalization.  Receiving ESA with nephrology  Left femoral condyle fracture Nonweightbearing of the left lower extremity,  Awaiting for skilled nursing facility placement.  Hyponatremia Latest sodium of 126.  On hemodialysis.    Hypomagnesemia.  Replenished.  Magnesium of 1.7.  Type 2 diabetes mellitus, insulin-dependent Latest Hemoglobin A1c 7.1 on 07/22/2021,  controlled.  Long-acting insulin on hold.  Continue sliding scale insulin.  Latest POC glucose of 153.  ESRD on HD [facility] Nephrology on board for hemodialysis.  Has refused hemodialysis in a chair today.  Will need to tolerate hemodialysis in the chair on discharge.  Depression/Anxiety Continue amitriptyline and duloxetine  Essential hypertension Continue amlodipine and Coreg.  Has been started on hydralazine 50mg  3 times daily at this time.  We will continue to monitor.   Obesity Body mass index is 32.77 kg/m.   Would benefit from weight loss as outpatient.    Malfunctioning hemodialysis fistula.  Vascular surgery was consulted but patient had denied fistulogram initially.  Willing to consider at this time and will need vascular surgery to assess.  DVT prophylaxis: SCDs Start: 08/10/21 1056 SCDs Start: 08/07/21 2226    Code Status: Full Code  Family Communication:  Spoke with the patient daughter on 08/18/2021  Disposition Plan:  Skilled nursing facility, awaiting for hemodialysis set up as outpatient, pending tolerance of hemodialysis  in chair.  Level of care: Med-Surg  Status is: Inpatient  Remains inpatient appropriate because:  IV antibiotics, SNF placement, awaiting outpatient hemodialysis    Consultants:   Orthopedics,  Nephrology Infectious disease Vascular surgery  Procedures:  Hardware removal of left hip affixes nail with partial resection of tuft tissue and femur that were nonviable by Dr. [name] on 08/10/2021.  Hemodialysis PRBC transfusion  Antimicrobials:  Cefazolin 08/14/2021  Subjective: Today, patient was seen and examined at bedside.  Denies any pain, nausea, vomiting, fever or chills.    Objective: Vitals:   08/19/21 0900 08/19/21 0930 08/19/21 1000 08/19/21 1030  BP: (!) 135/55 (!) 146/54 (!) 137/52 (!) 133/54  Pulse: 65 65 63 64  Resp: 13 12 10 14   Temp:      TempSrc:      SpO2: 97% 96% 96% 95%  Weight:      Height:        Intake/Output Summary (Last 24 hours) at 08/19/2021 1102 Last data filed at 08/18/2021 2000 Gross per 24 hour  Intake 120 ml  Output 800 ml  Net -680 ml     Filed Weights   08/16/21 0928 08/16/21 1338 08/19/21 0714  Weight: 107 kg 102.1 kg 103.6 kg   Body mass index is 32.77 kg/m.   Physical examination:  General: Obese built, not in obvious distress, chronically ill HENT:   No scleral pallor or icterus noted. Oral mucosa is moist.  Chest:    Diminished breath sounds bilaterally. No crackles or wheezes.  CVS: S1 &S2 heard. No murmur.  Regular rate and rhythm. Abdomen: Soft, nontender, nondistended.  Bowel sounds are heard.   Extremities: Left below-knee amputation, left hip with dressing. Psych: Alert, awake and oriented, normal mood CNS:  No cranial nerve deficits.    Skin: Warm and dry.  Left hip with dressing.   Data Reviewed: I have personally reviewed following labs and imaging studies  CBC: Recent Labs  Lab 08/14/21 0158 08/14/21 1822 08/15/21 0145 08/16/21 0150 08/18/21 0145 08/19/21 0209  WBC 9.8  --  8.6 8.1 9.1 11.6*  HGB 6.8* 7.8* 7.6* 7.4* 7.6* 7.6*  HCT 20.8* 24.1* 22.9* 23.2* 23.4* 24.1*  MCV 91.2  --  89.1 90.3 91.8 92.0  PLT 240  --  209 248 246 756    Basic Metabolic Panel: Recent Labs  Lab  08/13/21 0136 08/14/21 0158 08/15/21 0145 08/16/21 0150 08/18/21 0145 08/19/21 0209  NA 123* 120* 122* 123* 125* 126*  K 3.9 4.3 3.7 4.1 3.9 3.9  CL 93* 89* 88* 89* 92* 90*  CO2 24 25 26 25 24 27   GLUCOSE 179* 74 87 186* 160* 155*  BUN 31* 39* 23* 30* 29* 42*  CREATININE 3.80* 4.84* 3.22* 4.29* 4.57* 5.58*  CALCIUM 7.1* 7.6* 7.4* 8.0* 8.0* 8.3*  MG  --  1.5* 1.5*  --   --  1.7  PHOS 4.0  --   --   --   --   --     GFR: Estimated Creatinine Clearance: 15 mL/min (A) (by C-G formula based on SCr of 5.58 mg/dL (H)). Liver Function Tests: Recent Labs  Lab 08/13/21 0136  ALBUMIN 1.9*    No results for input(s): "LIPASE", "AMYLASE" in the last 168 hours. No results for input(s): "AMMONIA" in the last 168 hours. Coagulation Profile: No results for input(s): "INR", "PROTIME" in the last 168 hours. Cardiac Enzymes: No results for input(s): "CKTOTAL", "CKMB", "CKMBINDEX", "TROPONINI" in the last 168 hours. BNP (last 3  results) No results for input(s): "PROBNP" in the last 8760 hours. HbA1C: No results for input(s): "HGBA1C" in the last 72 hours. CBG: Recent Labs  Lab 08/18/21 1649 08/18/21 2149 08/18/21 2217 08/19/21 0138 08/19/21 0405  GLUCAP 197* 179* 180* 166* 153*    Lipid Profile: No results for input(s): "CHOL", "HDL", "LDLCALC", "TRIG", "CHOLHDL", "LDLDIRECT" in the last 72 hours. Thyroid Function Tests: No results for input(s): "TSH", "T4TOTAL", "FREET4", "T3FREE", "THYROIDAB" in the last 72 hours. Anemia Panel: No results for input(s): "VITAMINB12", "FOLATE", "FERRITIN", "TIBC", "IRON", "RETICCTPCT" in the last 72 hours.  Sepsis Labs: No results for input(s): "PROCALCITON", "LATICACIDVEN" in the last 168 hours.  Recent Results (from the past 240 hour(s))  Aerobic/Anaerobic Culture w Gram Stain (surgical/deep wound)     Status: None   Collection Time: 08/10/21  8:22 AM   Specimen: Wound; Abscess  Result Value Ref Range Status   Specimen Description TISSUE   Final   Special Requests LEFT HIP WOUND  Final   Gram Stain   Final    RARE WBC PRESENT,BOTH PMN AND MONONUCLEAR NO ORGANISMS SEEN    Culture   Final    RARE STAPHYLOCOCCUS AUREUS CRITICAL RESULT CALLED TO, READ BACK BY AND VERIFIED WITH: RN [name] [id number] FCP NO ANAEROBES ISOLATED Performed at [facility], [place] [address] [place], [place] [address]    Report Status 08/15/2021 FINAL  Final   Organism ID, Bacteria STAPHYLOCOCCUS AUREUS  Final      Susceptibility   Staphylococcus aureus - MIC*    CIPROFLOXACIN <=0.5 SENSITIVE Sensitive     ERYTHROMYCIN <=0.25 SENSITIVE Sensitive     GENTAMICIN <=0.5 SENSITIVE Sensitive     OXACILLIN 0.5 SENSITIVE Sensitive     TETRACYCLINE <=1 SENSITIVE Sensitive     VANCOMYCIN 1 SENSITIVE Sensitive     TRIMETH/SULFA <=10 SENSITIVE Sensitive     CLINDAMYCIN <=0.25 SENSITIVE Sensitive     RIFAMPIN <=0.5 SENSITIVE Sensitive     Inducible Clindamycin NEGATIVE Sensitive     * RARE STAPHYLOCOCCUS AUREUS      Radiology Studies: No results found.    Scheduled Meds:  (feeding supplement) PROSource Plus  30 mL Oral BID BM   alosetron  1 mg Oral BID   amitriptyline  25 mg Oral QHS   amLODipine  5 mg Oral Daily   atorvastatin  40 mg Oral q1800   carvedilol  6.25 mg Oral BID   Chlorhexidine Gluconate Cloth  6 each Topical Q0600   darbepoetin (ARANESP) injection - DIALYSIS  150 mcg Intravenous Q Fri-HD   docusate sodium  100 mg Oral BID   DULoxetine  60 mg Oral Daily   hydrALAZINE  50 mg Oral TID   insulin aspart  0-6 Units Subcutaneous Q4H   magnesium oxide  400 mg Oral BID   metroNIDAZOLE  500 mg Oral Q12H   pantoprazole  40 mg Oral Daily   Continuous Infusions:  sodium chloride 10 mL/hr at 08/12/21 1629    ceFAZolin (ANCEF) IV 2 g (08/16/21 1750)   methocarbamol (ROBAXIN) IV       LOS: 12 days    Flora Lipps, MD [facility] Available via Epic secure chat 7am-7pm After these hours, please refer to  coverage provider listed on [email or web address] 08/19/2021, 11:02 AM

## 2021-08-19 NOTE — Progress Notes (Addendum)
[facility] Progress Note   Subjective: Seen on HD. Refused to have HD in recliner.      Objective Vitals:   08/19/21 0724 08/19/21 0800 08/19/21 0830 08/19/21 0900  BP: (!) 155/63 (!) 147/60 (!) 150/60 (!) 135/55  Pulse: 65 66 65 65  Resp: 13 10 10 13   Temp:      TempSrc:      SpO2: 94% 95% 97% 97%  Weight:      Height:       Physical Exam General: Chronically ill appearing obese female in NAD Heart: S1,S2 No M/R/G. SR on monitor.  Lungs: CTAB No WOB.  Abdomen: Obese, NABS, NT Extremities: L BKA in ACE wrap. 1-2+ edema Dialysis Access: AVF +T/B    Additional Objective Labs: Basic Metabolic Panel: Recent Labs  Lab 08/13/21 0136 08/14/21 0158 08/16/21 0150 08/18/21 0145 08/19/21 0209  NA 123*   < > 123* 125* 126*  K 3.9   < > 4.1 3.9 3.9  CL 93*   < > 89* 92* 90*  CO2 24   < > 25 24 27   GLUCOSE 179*   < > 186* 160* 155*  BUN 31*   < > 30* 29* 42*  CREATININE 3.80*   < > 4.29* 4.57* 5.58*  CALCIUM 7.1*   < > 8.0* 8.0* 8.3*  PHOS 4.0  --   --   --   --    < > = values in this interval not displayed.   Liver Function Tests: Recent Labs  Lab 08/13/21 0136  ALBUMIN 1.9*   No results for input(s): "LIPASE", "AMYLASE" in the last 168 hours. CBC: Recent Labs  Lab 08/14/21 0158 08/14/21 1822 08/15/21 0145 08/16/21 0150 08/18/21 0145 08/19/21 0209  WBC 9.8  --  8.6 8.1 9.1 11.6*  HGB 6.8*   < > 7.6* 7.4* 7.6* 7.6*  HCT 20.8*   < > 22.9* 23.2* 23.4* 24.1*  MCV 91.2  --  89.1 90.3 91.8 92.0  PLT 240  --  209 248 246 287   < > = values in this interval not displayed.   Blood Culture    Component Value Date/Time   SDES TISSUE 08/10/2021 0822   SPECREQUEST LEFT HIP WOUND 08/10/2021 0822   CULT  08/10/2021 4174    RARE STAPHYLOCOCCUS AUREUS CRITICAL RESULT CALLED TO, READ BACK BY AND VERIFIED WITH: RN [name] [id number] FCP NO ANAEROBES ISOLATED Performed at [facility], [place] [address] [place], [place] [address]     REPTSTATUS 08/15/2021 FINAL 08/10/2021 5885    Cardiac Enzymes: No results for input(s): "CKTOTAL", "CKMB", "CKMBINDEX", "TROPONINI" in the last 168 hours. CBG: Recent Labs  Lab 08/18/21 1649 08/18/21 2149 08/18/21 2217 08/19/21 0138 08/19/21 0405  GLUCAP 197* 179* 180* 166* 153*   Iron Studies: No results for input(s): "IRON", "TIBC", "TRANSFERRIN", "FERRITIN" in the last 72 hours. @lablastinr3 @ Studies/Results: No results found. Medications:  sodium chloride 10 mL/hr at 08/12/21 1629    ceFAZolin (ANCEF) IV 2 g (08/16/21 1750)   methocarbamol (ROBAXIN) IV      (feeding supplement) PROSource Plus  30 mL Oral BID BM   alosetron  1 mg Oral BID   amitriptyline  25 mg Oral QHS   amLODipine  5 mg Oral Daily   atorvastatin  40 mg Oral q1800   carvedilol  6.25 mg Oral BID   Chlorhexidine Gluconate Cloth  6 each Topical Q0600   darbepoetin (ARANESP) injection - DIALYSIS  150 mcg  Intravenous Q Fri-HD   docusate sodium  100 mg Oral BID   DULoxetine  60 mg Oral Daily   hydrALAZINE  50 mg Oral TID   insulin aspart  0-6 Units Subcutaneous Q4H   magnesium oxide  400 mg Oral BID   metroNIDAZOLE  500 mg Oral Q12H   pantoprazole  40 mg Oral Daily     Dialysis Orders: [facility]-MWF 4hrs; BFR 400; DFR 500; 2K/2Ca; EDW 83kg   Assessment/Plan: 1.  Left hip infection with hardware involvement and recent left femoral condyle fracture: S/p L hip nail removal, partial resection of L femur for osteo, and resection of infected soft tissue. Cx + MSSA. Will need Cefazolin 2g IV q HD until 09/22/21. Also on PO metronidazole BID. 2.  End-stage renal disease: Continue HD per usual MWF schedule. HD today on schedule. REFUSED to have HD in recliner. Says she isn't ready yet. No issues cannulating AVF today. She says "it depends on who sticks me".   3.  Hypertension: BP high with edema and hyponatremia. Na 126 today. Net UFG set to 3.5 liters today. Continue lowering volume as tolerated.   4.  Anemia: Hgb 7.6 (low). Aranesp ^ to 172mcg on 6/16. Follow HGB. No evidence of overt bleeding present.  5.  Secondary hyperparathyroidism:  CorrCa/Phos ok. No binders. 6. Nutrition: Alb very low, continue supplements. 7. AVF issues: Have asked [facility] to see patient D/T issues cannulating AVF however today, no issues with cannulation. AVF is working fine. Patient says it is dependent on who sticks her.   [name] NP-C 08/19/2021, 9:11 AM  [employer] [phone]   Seen and examined independently.  Agree with note and exam as documented above by physician extender and as noted here.  ESRD on HD - she refused HD in a chair today and she is to work toward HD in a recliner on Wednesday   See also procedure note from today.   Claudia Desanctis, MD 08/19/2021  3:49 PM

## 2021-08-20 DIAGNOSIS — N186 End stage renal disease: Secondary | ICD-10-CM | POA: Diagnosis not present

## 2021-08-20 DIAGNOSIS — S72492A Other fracture of lower end of left femur, initial encounter for closed fracture: Secondary | ICD-10-CM | POA: Diagnosis not present

## 2021-08-20 DIAGNOSIS — T847XXD Infection and inflammatory reaction due to other internal orthopedic prosthetic devices, implants and grafts, subsequent encounter: Secondary | ICD-10-CM | POA: Diagnosis not present

## 2021-08-20 DIAGNOSIS — M00059 Staphylococcal arthritis, unspecified hip: Secondary | ICD-10-CM | POA: Diagnosis not present

## 2021-08-20 LAB — GLUCOSE, CAPILLARY
Glucose-Capillary: 155 mg/dL — ABNORMAL HIGH (ref 70–99)
Glucose-Capillary: 165 mg/dL — ABNORMAL HIGH (ref 70–99)
Glucose-Capillary: 199 mg/dL — ABNORMAL HIGH (ref 70–99)
Glucose-Capillary: 208 mg/dL — ABNORMAL HIGH (ref 70–99)
Glucose-Capillary: 216 mg/dL — ABNORMAL HIGH (ref 70–99)
Glucose-Capillary: 257 mg/dL — ABNORMAL HIGH (ref 70–99)

## 2021-08-20 LAB — RENAL FUNCTION PANEL
Albumin: 2.1 g/dL — ABNORMAL LOW (ref 3.5–5.0)
Anion gap: 9 (ref 5–15)
BUN: 32 mg/dL — ABNORMAL HIGH (ref 6–20)
CO2: 28 mmol/L (ref 22–32)
Calcium: 8.3 mg/dL — ABNORMAL LOW (ref 8.9–10.3)
Chloride: 90 mmol/L — ABNORMAL LOW (ref 98–111)
Creatinine, Ser: 4.58 mg/dL — ABNORMAL HIGH (ref 0.44–1.00)
GFR, Estimated: 11 mL/min — ABNORMAL LOW (ref >60)
Glucose, Bld: 238 mg/dL — ABNORMAL HIGH (ref 70–99)
Phosphorus: 3 mg/dL (ref 2.5–4.6)
Potassium: 4.8 mmol/L (ref 3.5–5.1)
Sodium: 127 mmol/L — ABNORMAL LOW (ref 135–145)

## 2021-08-20 MED ORDER — PANTOPRAZOLE SODIUM 40 MG PO TBEC
40.0000 mg | DELAYED_RELEASE_TABLET | Freq: Two times a day (BID) | ORAL | Status: DC
  Administered 2021-08-20 – 2021-08-23 (×6): 40 mg via ORAL
  Filled 2021-08-20 (×6): qty 1

## 2021-08-20 NOTE — Plan of Care (Signed)
No acute events overnight. Problem: Education: Goal: Ability to describe self-care measures that may prevent or decrease complications (Diabetes Survival Skills Education) will improve Outcome: Progressing Goal: Individualized Educational Video(s) Outcome: Progressing   Problem: Coping: Goal: Ability to adjust to condition or change in health will improve Outcome: Progressing   Problem: Fluid Volume: Goal: Ability to maintain a balanced intake and output will improve Outcome: Progressing   Problem: Health Behavior/Discharge Planning: Goal: Ability to identify and utilize available resources and services will improve Outcome: Progressing Goal: Ability to manage health-related needs will improve Outcome: Progressing   Problem: Metabolic: Goal: Ability to maintain appropriate glucose levels will improve Outcome: Progressing   Problem: Nutritional: Goal: Maintenance of adequate nutrition will improve Outcome: Progressing Goal: Progress toward achieving an optimal weight will improve Outcome: Progressing   Problem: Skin Integrity: Goal: Risk for impaired skin integrity will decrease Outcome: Progressing   Problem: Tissue Perfusion: Goal: Adequacy of tissue perfusion will improve Outcome: Progressing   Problem: Education: Goal: Knowledge of General Education information will improve Description: Including pain rating scale, medication(s)/side effects and non-pharmacologic comfort measures Outcome: Progressing   Problem: Health Behavior/Discharge Planning: Goal: Ability to manage health-related needs will improve Outcome: Progressing   Problem: Clinical Measurements: Goal: Ability to maintain clinical measurements within normal limits will improve Outcome: Progressing Goal: Will remain free from infection Outcome: Progressing Goal: Diagnostic test results will improve Outcome: Progressing Goal: Respiratory complications will improve Outcome: Progressing Goal:  Cardiovascular complication will be avoided Outcome: Progressing   Problem: Activity: Goal: Risk for activity intolerance will decrease Outcome: Progressing   Problem: Nutrition: Goal: Adequate nutrition will be maintained Outcome: Progressing   Problem: Coping: Goal: Level of anxiety will decrease Outcome: Progressing   Problem: Elimination: Goal: Will not experience complications related to bowel motility Outcome: Progressing Goal: Will not experience complications related to urinary retention Outcome: Progressing   Problem: Pain Managment: Goal: General experience of comfort will improve Outcome: Progressing   Problem: Safety: Goal: Ability to remain free from injury will improve Outcome: Progressing   Problem: Skin Integrity: Goal: Risk for impaired skin integrity will decrease Outcome: Progressing   

## 2021-08-20 NOTE — Progress Notes (Signed)
Contacted inpt HD unit and spoke to charge RN. Requested that pt receive HD in chair tomorrow if possible. Made staff aware that this was attempted on Monday but pt refused. Pt cannot be accepted at out-pt clinic until pt proves she can tolerate HD in chair. Will assist as needed.   Melven Sartorius Renal Navigator [phone]

## 2021-08-20 NOTE — Progress Notes (Signed)
Inpatient Rehab Admissions Coordinator:    I spoke with pt. To discuss CIR. I emphasized to pt. That she must participate with PT/OT consistently in order to be considered for CIR and must resume sitting up for HD. She stated understanding. However, she declined OT immediately following conversation. I will see how she participates tomorrow, but if she continues to decline attempts to work with her, I will not attempt to bring her to CIR.   Clemens Catholic, [place], [place] Admissions Coordinator  [phone] ([place]) [phone] (office)

## 2021-08-20 NOTE — Progress Notes (Addendum)
[name] Progress Note   Subjective:  Seen in room. C/O nausea and heartburn. Says she Dexlansoprazole at home but apparently not available here. Discussed with pharmacy-will increase pantoprazole dose. Peripheral IV infiltrated. RN notified. Not a good morning for her.   Agrees to have HD in recliner tomorrow.   Objective Vitals:   08/20/21 0411 08/20/21 0813 08/20/21 0958 08/20/21 1040  BP: (!) 138/58 (!) 150/53    Pulse: 64 67    Resp: 16 17 18 16   Temp:  98.1 F (36.7 C)    TempSrc:  Oral    SpO2: 97% 97%    Weight:      Height:       Physical Exam General: Chronically ill appearing obese female in NAD Heart: S1,S2 No M/R/G. SR on monitor.  Lungs: CTAB No WOB.  Abdomen: Obese, NABS, NT Extremities: L BKA in ACE wrap. 1-2+ edema Dialysis Access: AVF +T/B     Dialysis Orders:  Additional Objective Labs: Basic Metabolic Panel: Recent Labs  Lab 08/16/21 0150 08/18/21 0145 08/19/21 0209  NA 123* 125* 126*  K 4.1 3.9 3.9  CL 89* 92* 90*  CO2 25 24 27   GLUCOSE 186* 160* 155*  BUN 30* 29* 42*  CREATININE 4.29* 4.57* 5.58*  CALCIUM 8.0* 8.0* 8.3*   Liver Function Tests: No results for input(s): "AST", "ALT", "ALKPHOS", "BILITOT", "PROT", "ALBUMIN" in the last 168 hours. No results for input(s): "LIPASE", "AMYLASE" in the last 168 hours. CBC: Recent Labs  Lab 08/14/21 0158 08/14/21 1822 08/15/21 0145 08/16/21 0150 08/18/21 0145 08/19/21 0209  WBC 9.8  --  8.6 8.1 9.1 11.6*  HGB 6.8*   < > 7.6* 7.4* 7.6* 7.6*  HCT 20.8*   < > 22.9* 23.2* 23.4* 24.1*  MCV 91.2  --  89.1 90.3 91.8 92.0  PLT 240  --  209 248 246 287   < > = values in this interval not displayed.   Blood Culture    Component Value Date/Time   SDES TISSUE 08/10/2021 0822   SPECREQUEST LEFT HIP WOUND 08/10/2021 0822   CULT  08/10/2021 2703    RARE STAPHYLOCOCCUS AUREUS CRITICAL RESULT CALLED TO, READ BACK BY AND VERIFIED WITH: RN [name] [id number] FCP NO  ANAEROBES ISOLATED Performed at [facility], [place] [address], [place], [place] [address]    REPTSTATUS 08/15/2021 FINAL 08/10/2021 8101    Cardiac Enzymes: No results for input(s): "CKTOTAL", "CKMB", "CKMBINDEX", "TROPONINI" in the last 168 hours. CBG: Recent Labs  Lab 08/19/21 1614 08/19/21 2005 08/20/21 0025 08/20/21 0412 08/20/21 0815  GLUCAP 275* 241* 208* 165* 155*   Iron Studies: No results for input(s): "IRON", "TIBC", "TRANSFERRIN", "FERRITIN" in the last 72 hours. @lablastinr3 @ Studies/Results: No results found. Medications:  sodium chloride 10 mL/hr at 08/12/21 1629    ceFAZolin (ANCEF) IV 2 g (08/19/21 1727)   methocarbamol (ROBAXIN) IV      (feeding supplement) PROSource Plus  30 mL Oral BID BM   alosetron  1 mg Oral BID   amitriptyline  25 mg Oral QHS   amLODipine  5 mg Oral Daily   atorvastatin  40 mg Oral q1800   carvedilol  6.25 mg Oral BID   Chlorhexidine Gluconate Cloth  6 each Topical Q0600   darbepoetin (ARANESP) injection - DIALYSIS  150 mcg Intravenous Q Fri-HD   docusate sodium  100 mg Oral BID   DULoxetine  60 mg Oral Daily   hydrALAZINE  50 mg Oral TID  insulin aspart  0-6 Units Subcutaneous Q4H   magnesium oxide  400 mg Oral BID   pantoprazole  40 mg Oral Daily     Dialysis Orders: [facility]-MWF 4hrs; BFR 400; DFR 500; 2K/2Ca; EDW 83kg   Assessment/Plan: 1.  Left hip infection with hardware involvement and recent left femoral condyle fracture: S/p L hip nail removal, partial resection of L femur for osteo, and resection of infected soft tissue. Cx + MSSA. Will need Cefazolin 2g IV q HD until 09/22/21. Also on PO metronidazole BID. 2.  End-stage renal disease: Continue HD per usual MWF schedule. Next HD 08/21/2021. Agrees to have HD in chair.  3.  Hypertension: BP high with edema and hyponatremia. Na 126, peripheral edema present. Net UFG 3.8 L 08/19/2021. Continue lowering volume as tolerated.  4.  Anemia: Hgb 7.6 (low).  Aranesp ^ to 173mcg on 6/16, will increase dose again. Follow HGB. No evidence of overt bleeding present. Transfuse if HGB < 7.0 5.  Secondary hyperparathyroidism:  CorrCa/Phos ok. No binders. 6. Nutrition: Alb very low, continue supplements. 7. AVF issues: Have asked [facility]VS to see patient D/T issues cannulating AVF however today, no issues with cannulation. AVF is working fine. Patient says it is dependent on who sticks her.  8. H/O severe GERD-on dexlansoprazole at home which is not on formulary here. Increase pantoprazole to 40 mg PO BID.   [name] NP-C 08/20/2021, 11:31 AM  [employer] [phone]     Seen and examined independently.  Agree with note and exam as documented above by physician extender and as noted here.  General adult female in bed in no acute distress HEENT normocephalic atraumatic extraocular movements intact sclera anicteric Neck supple trachea midline Lungs clear to auscultation bilaterally normal work of breathing at rest  Heart S1S2 no rub Abdomen soft nontender nondistended Extremities 1+ RLE edema left BKA, leg wrapped  Psych normal mood and affect Access LUE AVF bruit and thrill   She states she is getting out of bed to chair after lunch  ESRD on HD - HD per MWF schedule.  she refused HD in a chair monday and she is working toward HD in a recliner on Wednesday   HTN - optimize volume with HD  Hyponatremia - update renal panel.  Note meds. Optimize volume with HD and then assess if medication changes needed  Anemia CKD - aranesp was increased to 150 mcg weekly on 6/16  Claudia Desanctis, MD 08/20/2021 12:42 PM

## 2021-08-20 NOTE — Progress Notes (Signed)
PT Cancellation Note  Patient Details Name: [name] MRN: [id number] DOB: [birth year]   Cancelled Treatment:    Reason Eval/Treat Not Completed: Other (comment) Pt refused x 2 due to nausea and migraine. States she will get in chair for HD tomorrow.   Wyona Almas, PT, DPT Acute Rehabilitation Services Office [phone]    Deno Etienne 08/20/2021, 3:14 PM

## 2021-08-20 NOTE — Progress Notes (Signed)
OT Cancellation Note  Patient Details Name: LEILANNI HALVORSON MRN: [id number] DOB: [birth year]   Cancelled Treatment:    Reason Eval/Treat Not Completed: Patient declined, no reason specified Pt reports heartburn/acid reflux discomfort and declined OT session at this time. Reinforced need to participate in therapy at acute level, especially if inquiring about CIR stay here. Feel SNF rehab recommendations remain appropriate though will further assess pending pt willingness to engage in OT session.   Layla Maw 08/20/2021, 2:20 PM

## 2021-08-20 NOTE — Progress Notes (Signed)
PROGRESS NOTE    [name]  TSV:[id number] DOB: 1965-04-21 DOA: 08/07/2021 PCP: Julian Hy, PA-C    Brief Narrative:   [name] is a 56 y.o. female with past medical history significant for hypertension, hyperlipidemia, end-stage renal disease on hemodialysis Monday, Wednesday, Friday, history of left BKA 2021, depression/anxiety, history of CVA, type 2 diabetes mellitus and chronic pain syndrome presented to the hospital as transfer from [facility] for distal left femur fracture secondary to mechanical fall.  This was deemed nonoperative as per [facility] orthopedics.  During the hospital course, patient had multifocal pneumonia and completed course of antibiotic on 08/07/2021.  Patient also underwent drainage of superficial abscess in the left hip on 07/28/21, culture was negative.  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  Patient underwent hardware removal on 08/10/2021.  Patient has been treated with antibiotic and dressing at this time.   Plan for 6 weeks of IV antibiotic treatment with HD,  using 6/11 as day 1. At this time patient is medically stable.  Awaiting for outpatient hemodialysis and placement at skilled nursing facility pending hemodialysis tolerance in a chair.  Assessment & Plan:  Principal Problem:   Septic arthritis of hip ([place]) Active Problems:   Skin abscess L hip   Type 2 diabetes mellitus with renal complication (HCC)   ESRD (end stage renal disease) on dialysis [facility])   Essential hypertension   Closed fracture of left distal femur (HCC)   Hardware complicating wound infection ([place])   Subacute osteomyelitis of left femur (HCC)   Left hip septic arthritis/osteomyelitis, abscess, hardware infection. Seen by orthopedics and infectious disease during hospitalization..   Patient underwent hardware removal of left hip affixes nail with partial resection of tuft tissue and  femur that were nonviable by Dr. [name] on 08/10/2021.  Blood cultures were negative.  Operative culture with rare Staph aureus.  Plan for 6 weeks of IV antibiotic treatment  with Cefazolin until 09/22/2021 as per infectious disease.  Latest WBC at 11.6.  Acute postoperative blood loss anemia Latest hemoglobin of 7.6.  Received total of 3 units of packed RBC during hospitalization.  Receiving ESA with nephrology  Left femoral condyle fracture Nonweightbearing of the left lower extremity,  Awaiting for skilled nursing facility placement.  Hyponatremia Latest sodium of 126.  On hemodialysis.  Nephrology on board.  Asymptomatic.  Hypomagnesemia.  Replenished.  Latest magnesium of 1.7.  Type 2 diabetes mellitus, insulin-dependent Latest Hemoglobin A1c 7.1 on 07/22/2021,  controlled.  Long-acting insulin on hold.  Continue sliding scale insulin.  Latest POC glucose of 153.  ESRD on HD MWF Nephrology on board for hemodialysis.   Will need to tolerate hemodialysis in the chair on discharge.  Extensive discussion done with the patient regarding the need for being in hemodialysis chair for being able to be discharged to rehabilitation facility.  Depression/Anxiety Continue amitriptyline and duloxetine  Essential hypertension Continue amlodipine, Coreg, hydralazine was initiated since yesterday we will continue to monitor and adjust medication as necessary.   Obesity Body mass index is 31.54 kg/m.   Would benefit from weight loss as outpatient.    Malfunctioning hemodialysis fistula.  Vascular surgery was consulted but patient had denied fistulogram initially.  Willing to consider at this time and will need vascular surgery to assess.  DVT prophylaxis: SCDs Start: 08/10/21 1056 SCDs Start: 08/07/21 2226    Code Status: Full Code  Family Communication:  Spoke with the patient daughter on 08/18/2021  Disposition Plan: Skilled nursing facility, Level of care: Med-Surg  Status is:  Inpatient  Remains inpatient appropriate because:  IV antibiotics, SNF placement, awaiting fistula evaluation, pending tolerance of hemodialysis outpatient in chair.   Consultants:  Orthopedics,  Nephrology Infectious disease Vascular surgery  Procedures:  Hardware removal of left hip affixes nail with partial resection of tuft tissue and femur that were nonviable by Dr. [name] on 08/10/2021.  Hemodialysis PRBC transfusion  Antimicrobials:  Cefazolin 08/14/2021 plan to continue until 09/22/2021   Subjective: Today, patient was seen and examined at bedside.  Denies any nausea vomiting fever chills or rigor.  No interval complaints.  Communicated with the patient regarding the need for being in hemodialysis chair   Objective: Vitals:   08/20/21 0411 08/20/21 0813 08/20/21 0958 08/20/21 1040  BP: (!) 138/58 (!) 150/53    Pulse: 64 67    Resp: 16 17 18 16   Temp:  98.1 F (36.7 C)    TempSrc:  Oral    SpO2: 97% 97%    Weight:      Height:        Intake/Output Summary (Last 24 hours) at 08/20/2021 1135 Last data filed at 08/19/2021 2000 Gross per 24 hour  Intake 240 ml  Output --  Net 240 ml     Filed Weights   08/16/21 1338 08/19/21 0714 08/19/21 1114  Weight: 102.1 kg 103.6 kg 99.7 kg   Body mass index is 31.54 kg/m.   Physical examination:  General: Obese built, not in obvious distress chronically ill HENT:   No scleral pallor or icterus noted. Oral mucosa is moist.  Chest:   Diminished breath sounds bilaterally. No crackles or wheezes.  CVS: S1 &S2 heard. No murmur.  Regular rate and rhythm. Abdomen: Soft, nontender, nondistended.  Bowel sounds are heard.   Extremities: No cyanosis, clubbing or edema.  Peripheral pulses are palpable.  Left below-knee amputation, left hip with dressing. Psych: Alert, awake and oriented, normal mood CNS:  No cranial nerve deficits.  Power equal in all extremities.   Skin:  Left hip with dressing.   Data Reviewed: I have personally  reviewed following labs and imaging studies  CBC: Recent Labs  Lab 08/14/21 0158 08/14/21 1822 08/15/21 0145 08/16/21 0150 08/18/21 0145 08/19/21 0209  WBC 9.8  --  8.6 8.1 9.1 11.6*  HGB 6.8* 7.8* 7.6* 7.4* 7.6* 7.6*  HCT 20.8* 24.1* 22.9* 23.2* 23.4* 24.1*  MCV 91.2  --  89.1 90.3 91.8 92.0  PLT 240  --  209 248 246 185    Basic Metabolic Panel: Recent Labs  Lab 08/14/21 0158 08/15/21 0145 08/16/21 0150 08/18/21 0145 08/19/21 0209  NA 120* 122* 123* 125* 126*  K 4.3 3.7 4.1 3.9 3.9  CL 89* 88* 89* 92* 90*  CO2 25 26 25 24 27   GLUCOSE 74 87 186* 160* 155*  BUN 39* 23* 30* 29* 42*  CREATININE 4.84* 3.22* 4.29* 4.57* 5.58*  CALCIUM 7.6* 7.4* 8.0* 8.0* 8.3*  MG 1.5* 1.5*  --   --  1.7    GFR: Estimated Creatinine Clearance: 14.7 mL/min (A) (by C-G formula based on SCr of 5.58 mg/dL (H)). Liver Function Tests: No results for input(s): "AST", "ALT", "ALKPHOS", "BILITOT", "PROT", "ALBUMIN" in the last 168 hours.  No results for input(s): "LIPASE", "AMYLASE" in the last 168 hours. No results for input(s): "AMMONIA" in the last 168 hours. Coagulation Profile: No results for input(s): "INR", "PROTIME"  in the last 168 hours. Cardiac Enzymes: No results for input(s): "CKTOTAL", "CKMB", "CKMBINDEX", "TROPONINI" in the last 168 hours. BNP (last 3 results) No results for input(s): "PROBNP" in the last 8760 hours. HbA1C: No results for input(s): "HGBA1C" in the last 72 hours. CBG: Recent Labs  Lab 08/19/21 1614 08/19/21 2005 08/20/21 0025 08/20/21 0412 08/20/21 0815  GLUCAP 275* 241* 208* 165* 155*    Lipid Profile: No results for input(s): "CHOL", "HDL", "LDLCALC", "TRIG", "CHOLHDL", "LDLDIRECT" in the last 72 hours. Thyroid Function Tests: No results for input(s): "TSH", "T4TOTAL", "FREET4", "T3FREE", "THYROIDAB" in the last 72 hours. Anemia Panel: No results for input(s): "VITAMINB12", "FOLATE", "FERRITIN", "TIBC", "IRON", "RETICCTPCT" in the last 72  hours.  Sepsis Labs: No results for input(s): "PROCALCITON", "LATICACIDVEN" in the last 168 hours.  No results found for this or any previous visit (from the past 240 hour(s)).     Radiology Studies: No results found.    Scheduled Meds:  (feeding supplement) PROSource Plus  30 mL Oral BID BM   alosetron  1 mg Oral BID   amitriptyline  25 mg Oral QHS   amLODipine  5 mg Oral Daily   atorvastatin  40 mg Oral q1800   carvedilol  6.25 mg Oral BID   Chlorhexidine Gluconate Cloth  6 each Topical Q0600   darbepoetin (ARANESP) injection - DIALYSIS  150 mcg Intravenous Q Fri-HD   docusate sodium  100 mg Oral BID   DULoxetine  60 mg Oral Daily   hydrALAZINE  50 mg Oral TID   insulin aspart  0-6 Units Subcutaneous Q4H   magnesium oxide  400 mg Oral BID   pantoprazole  40 mg Oral Daily   Continuous Infusions:  sodium chloride 10 mL/hr at 08/12/21 1629    ceFAZolin (ANCEF) IV 2 g (08/19/21 1727)   methocarbamol (ROBAXIN) IV       LOS: 13 days    Flora Lipps, MD [facility] Available via Epic secure chat 7am-7pm After these hours, please refer to coverage provider listed on [email or web address] 08/20/2021, 11:35 AM

## 2021-08-20 NOTE — Progress Notes (Signed)
Changed dressing left hip- saturated through two pads-heavy drainage serosanquinous.

## 2021-08-20 NOTE — Progress Notes (Signed)
Inpatient Rehab Admissions Coordinator:   Per SW request (at request of pt.) ,  patient was screened for CIR candidacy by Clemens Catholic, MS, CCC-SLP . At this time, Pt. Appears to be a a potential candidate for CIR. I will place  order for rehab consult per protocol for full assessment. Please contact me any with questions.  Clemens Catholic, [place], [place] Admissions Coordinator  [phone] ([place]) [phone] (office)

## 2021-08-20 NOTE — TOC Progression Note (Signed)
Transition of Care [facility]) - Progression Note    Patient Details  Name: [name] MRN: [id number] Date of Birth: [birth year]  [facility] of [facility]) CM/SW Contact  Joanne Chars, LCSW Phone Number: 08/20/2021, 1:57 PM  Clinical Narrative:   CSW spoke with pt for follow up from yesterday.  Pt states she has not spoken  to Dr [name], said a PA did come by, discussed if she had asked her questions to Dr Louanne Belton and she was not able to clearly answer who she had spoken to.  CSW asked her to help CSW understand what her question is and pt is again unclear, talking about concerns that she is going to have a "set back" at SNF.  Pt finally shares that she was at CIR in the past, doesn't understand why she "has to go [facility] the way to [facility] when I could do my rehab and HD right here."  CSW did not see in chart where pt has been evaluated by CIR, spoke with Laura/CIR and she reviewed the chart, said pt may be a candidate and she will look into it further.      Expected Discharge Plan: Skilled Nursing Facility Barriers to Discharge: Continued Medical Work up  Expected Discharge Plan and Services Expected Discharge Plan: [place] In-house Referral: Clinical Social Work Discharge Planning Services: CM Consult Post Acute Care Choice: [place] arrangements for the past 2 months: Single Family Home Expected Discharge Date: 08/16/21                                     Social Determinants of Health (SDOH) Interventions    Readmission Risk Interventions    07/26/2021    3:15 PM  Readmission Risk Prevention Plan  Transportation Screening Complete  PCP or Specialist Appt within 3-5 Days Complete  HRI or Home Care Consult Complete  Palliative Care Screening Not Applicable  Medication Review (RN Care Manager) Referral to Pharmacy

## 2021-08-21 DIAGNOSIS — M00059 Staphylococcal arthritis, unspecified hip: Secondary | ICD-10-CM | POA: Diagnosis not present

## 2021-08-21 DIAGNOSIS — T82868A Thrombosis of vascular prosthetic devices, implants and grafts, initial encounter: Secondary | ICD-10-CM

## 2021-08-21 DIAGNOSIS — Z992 Dependence on renal dialysis: Secondary | ICD-10-CM

## 2021-08-21 DIAGNOSIS — N186 End stage renal disease: Secondary | ICD-10-CM

## 2021-08-21 LAB — CBC
HCT: 26.4 % — ABNORMAL LOW (ref 36.0–46.0)
Hemoglobin: 8.3 g/dL — ABNORMAL LOW (ref 12.0–15.0)
MCH: 29.5 pg (ref 26.0–34.0)
MCHC: 31.4 g/dL (ref 30.0–36.0)
MCV: 94 fL (ref 80.0–100.0)
Platelets: 303 10*3/uL (ref 150–400)
RBC: 2.81 MIL/uL — ABNORMAL LOW (ref 3.87–5.11)
RDW: 15.5 % (ref 11.5–15.5)
WBC: 10.2 10*3/uL (ref 4.0–10.5)
nRBC: 0 % (ref 0.0–0.2)

## 2021-08-21 LAB — RENAL FUNCTION PANEL
Albumin: 2.1 g/dL — ABNORMAL LOW (ref 3.5–5.0)
Anion gap: 10 (ref 5–15)
BUN: 39 mg/dL — ABNORMAL HIGH (ref 6–20)
CO2: 26 mmol/L (ref 22–32)
Calcium: 8.7 mg/dL — ABNORMAL LOW (ref 8.9–10.3)
Chloride: 91 mmol/L — ABNORMAL LOW (ref 98–111)
Creatinine, Ser: 5.12 mg/dL — ABNORMAL HIGH (ref 0.44–1.00)
GFR, Estimated: 9 mL/min — ABNORMAL LOW (ref >60)
Glucose, Bld: 171 mg/dL — ABNORMAL HIGH (ref 70–99)
Phosphorus: 3.8 mg/dL (ref 2.5–4.6)
Potassium: 5.2 mmol/L — ABNORMAL HIGH (ref 3.5–5.1)
Sodium: 127 mmol/L — ABNORMAL LOW (ref 135–145)

## 2021-08-21 LAB — GLUCOSE, CAPILLARY
Glucose-Capillary: 150 mg/dL — ABNORMAL HIGH (ref 70–99)
Glucose-Capillary: 171 mg/dL — ABNORMAL HIGH (ref 70–99)
Glucose-Capillary: 172 mg/dL — ABNORMAL HIGH (ref 70–99)
Glucose-Capillary: 182 mg/dL — ABNORMAL HIGH (ref 70–99)
Glucose-Capillary: 201 mg/dL — ABNORMAL HIGH (ref 70–99)

## 2021-08-21 MED ORDER — PENTAFLUOROPROP-TETRAFLUOROETH EX AERO
1.0000 | INHALATION_SPRAY | CUTANEOUS | Status: DC | PRN
  Filled 2021-08-21: qty 116

## 2021-08-21 MED ORDER — DARBEPOETIN ALFA 200 MCG/0.4ML IJ SOSY
200.0000 ug | PREFILLED_SYRINGE | INTRAMUSCULAR | Status: DC
  Administered 2021-08-23: 200 ug via INTRAVENOUS
  Filled 2021-08-21: qty 0.4

## 2021-08-21 NOTE — Progress Notes (Signed)
Inpatient Rehab Admissions Coordinator:    Pt. Is not participating well enough for CIR at this time. It appears she has a bed offer at [facility] for short term rehab at [facility] later today. CIR will sign off. TOC aware.   Clemens Catholic, [place], [place] Admissions Coordinator  [phone] ([place]) [phone] (office)

## 2021-08-21 NOTE — Progress Notes (Signed)
PT Cancellation Note  Patient Details Name: [name] MRN: [id number] DOB: 05-28-1965   Cancelled Treatment:    Reason Eval/Treat Not Completed: Patient at procedure or test/unavailable. Pt in HD.   Lorriane Shire 08/21/2021, 8:58 AM

## 2021-08-21 NOTE — Consult Note (Addendum)
Hospital Consult    Reason for Consult:  AVF pulling clots, difficult to access Requesting Physician:  [name], NP MRN #:  [id number]  History of Present Illness: This is a 56 y.o. female with ESRD who is familiar to VVS. She has history of left BC AVF placed on 01/09/20 by Dr. Stanford Breed. The fistula has been working well up until her recent admission and there has been difficulty intermittently since with cannulation and low flow rates. She was previously offered at fistulogram but at the time refused. Her AVF has since been used during inpatient HD. She was able to dialyze successfully on Monday 6/19, however she is seen today during HD they were pulling clots and required multiple sticks for cannulation. VVS has been asked to re evaluate for possible fistulogram.   She usually dialyzes in [place] on MWF    She has hx of left BKA. PMH significant forESRD,  HTN, DM, neuropathy, GERD, depression, obesity.    The pt is on a statin for cholesterol management.  The pt is not on a daily aspirin.   Other AC:  none The pt is on CCB, BB for hypertension.   The pt is diabetic.   Tobacco hx:  current  Past Medical History:  Diagnosis Date   Anxiety 2002   Chest tightness    Chronic kidney disease    Depression 2001   Diabetes type 1, uncontrolled    at age 23   Diabetic neuropathy ([place])    Essential hypertension 2015   GERD (gastroesophageal reflux disease)    about age of 47   Headache    Nausea and vomiting in adult    Stroke [facility])    Urinary frequency    Yeast vaginitis     Past Surgical History:  Procedure Laterality Date   ACHILLES TENDON SURGERY Left 03/10/2013   Procedure: LEFT CHOPART AMPUTATION/ LEFT TENDON ACHILLES [place];  Surgeon: Wylene Simmer, MD;  Location: [place];  Service: Orthopedics;  Laterality: Left;   AMPUTATION Left 06/15/2012   Procedure: AMPUTATION DIGIT;  Surgeon: Meredith Pel, MD;  Location: [place];  Service: Orthopedics;  Laterality: Left;  Left great  toe revision amputation   AMPUTATION Left 01/12/2020   Procedure: AMPUTATION BELOW KNEE;  Surgeon: Wylene Simmer, MD;  Location: [place];  Service: Orthopedics;  Laterality: Left;   AV FISTULA PLACEMENT Left 01/09/2020   Procedure: LEFT UPPER ARM ARTERIOVENOUS (AV) FISTULA CREATION;  Surgeon: Cherre Robins, MD;  Location: [place];  Service: Vascular;  Laterality: Left;   ENDOMETRIAL ABLATION     HARDWARE REMOVAL Left 08/10/2021   Procedure: LEFT HIP REMOVAL NAIL;  Surgeon: Newt Minion, MD;  Location: [place];  Service: Orthopedics;  Laterality: Left;   I & D EXTREMITY Left 01/19/2020   Procedure: IRRIGATION AND DEBRIDEMENT Left Hip;  Surgeon: Rod Can, MD;  Location: [place];  Service: Orthopedics;  Laterality: Left;   INSERTION OF DIALYSIS CATHETER Right 01/09/2020   Procedure: INSERTION OF DIALYSIS CATHETER;  Surgeon: Cherre Robins, MD;  Location: [place];  Service: Vascular;  Laterality: Right;   INTRAMEDULLARY (IM) NAIL INTERTROCHANTERIC Left 12/29/2019   Procedure: INTRAMEDULLARY (IM) NAIL INTERTROCHANTRIC;  Surgeon: Rod Can, MD;  Location: [facility];  Service: Orthopedics;  Laterality: Left;   IR ANGIOGRAM EXTREMITY LEFT  12/17/2018   IR FEM POP ART ATHERECT INC PTA MOD SED  12/17/2018   IR RADIOLOGIST EVAL & MGMT  11/30/2018   IR RADIOLOGIST EVAL & MGMT  12/09/2018  IR RADIOLOGIST EVAL & MGMT  02/08/2019   IR US GUIDE VASC ACCESS RIGHT  12/17/2018   LAPAROSCOPIC CHOLECYSTECTOMY  01/30/2015   [facility]     Allergies  Allergen Reactions   Trazodone Swelling   Latex Rash   Lidocaine Itching    Prior to Admission medications   Medication Sig Start Date End Date Taking? Authorizing Provider  acetaminophen (TYLENOL) 500 MG tablet Take 1,000 mg by mouth every 6 (six) hours as needed for mild pain.   Yes [provider]  alosetron (LOTRONEX) 1 MG tablet Take 1 mg by mouth 2 (two) times daily. 12/19/19  Yes [provider]  amitriptyline (ELAVIL) 25 MG  tablet Take 25 mg by mouth at bedtime. 07/02/21  Yes [provider]  amLODipine (NORVASC) 5 MG tablet Take 1 tablet (5 mg total) by mouth daily. 02/21/20  Yes Love, Ivan Anchors, PA-C  atorvastatin (LIPITOR) 40 MG tablet TAKE 1 TABLET (40 MG TOTAL) BY MOUTH DAILY AT 6 PM. 02/22/19  Yes Fulp, Cammie, MD  Biotin 10 MG CAPS Take 1 capsule by mouth daily. 06/05/21  Yes [provider]  carvedilol (COREG) 6.25 MG tablet Take 1 tablet (6.25 mg total) by mouth 2 (two) times daily. 02/21/20  Yes Love, Ivan Anchors, PA-C  ceFAZolin (ANCEF) IVPB Inject 2 g into the vein every Monday, Wednesday, and Friday with hemodialysis. Indication:  MSSA L-hip infection First Dose: Yes Last Day of Therapy:  09/22/21 Labs - Once weekly:  CBC/D and BMP, Labs - Every other week:  ESR and CRP Method of administration: IV Push Method of administration may be changed at the discretion of home infusion pharmacist based upon assessment of the patient and/or caregiver's ability to self-administer the medication ordered. 08/16/21 09/24/21 Yes Pokhrel, Laxman, MD  Cinnamon 500 MG capsule Take 500 mg by mouth 2 (two) times daily.   Yes [provider]  cyclobenzaprine (FLEXERIL) 10 MG tablet Take 10 mg by mouth 3 (three) times daily as needed for muscle spasms. 07/28/20  Yes [provider]  D3-50 1.25 MG (50000 UT) capsule Take 50,000 Units by mouth as directed. Monday, wed, Friday 05/31/20  Yes [provider]  dexlansoprazole (DEXILANT) 60 MG capsule Take 60 mg by mouth daily.   Yes [provider]  diphenoxylate-atropine (LOMOTIL) 2.5-0.025 MG tablet Take 1 tablet by mouth every 12 (twelve) hours as needed for diarrhea or loose stools.   Yes [provider]  DULoxetine (CYMBALTA) 60 MG capsule Take 60 mg by mouth daily. 07/17/20  Yes [provider]  hydrALAZINE (APRESOLINE) 100 MG tablet Take 1 tablet (100 mg total) by mouth 3 (three) times daily. 02/21/20  Yes Love, Ivan Anchors, PA-C  insulin aspart (NOVOLOG) 100 UNIT/ML injection Inject 6 Units into the skin 3 (three) times daily with meals. 08/05/21  Yes Lorella Nimrod, MD  Menthol-Methyl Salicylate (MUSCLE RUB) 10-15 % CREA Apply 1 application topically as needed for muscle pain. 02/02/20  Yes Love, Ivan Anchors, PA-C  naloxone [facility]) nasal spray 4 mg/0.1 mL 1 spray as directed. 04/27/20  Yes [provider]  nicotine (NICODERM CQ - DOSED IN MG/24 HOURS) 21 mg/24hr patch Place 1 patch (21 mg total) onto the skin daily as needed (nicotine craving). 07/25/21  Yes Emeterio Reeve, DO  polyethylene glycol (MIRALAX / GLYCOLAX) 17 g packet Take 17 g by mouth daily as needed for mild constipation. 07/25/21  Yes Emeterio Reeve, DO  sevelamer carbonate (RENVELA) 800 MG tablet Take 2  tablets (1,600 mg total) by mouth 3 (three) times daily with meals. 02/21/20  Yes Love, Ivan Anchors, PA-C  sodium chloride (OCEAN) 0.65 % SOLN nasal spray Place 1 spray into both nostrils as needed for congestion (nose irritation). 02/02/20  Yes Love, Ivan Anchors, PA-C  acetaminophen (TYLENOL) 325 MG tablet Take 2 tablets (650 mg total) by mouth every 6 (six) hours as needed for mild pain (or Fever >/= 101). 08/16/21   Pokhrel, Corrie Mckusick, MD  docusate sodium (COLACE) 100 MG capsule Take 1 capsule (100 mg total) by mouth 2 (two) times daily. 08/16/21   Pokhrel, Corrie Mckusick, MD  insulin aspart (NOVOLOG) 100 UNIT/ML injection Inject 0-15 Units into the skin 3 (three) times daily with meals. 08/05/21   Lorella Nimrod, MD  insulin aspart (NOVOLOG) 100 UNIT/ML injection Inject 0-5 Units into the skin at bedtime. 08/05/21   Lorella Nimrod, MD  insulin glargine (LANTUS) 100 UNIT/ML injection Inject 0.1 mLs (10 Units total) into the skin at bedtime. 08/16/21   Pokhrel, Corrie Mckusick, MD  iron sucrose in sodium chloride 0.9 % 100 mL Iron Sucrose (Venofer) 05/21/20   [provider]  meropenem 500 mg in sodium chloride 0.9 % 100 mL Inject 500 mg into the vein daily. 08/05/21    Lorella Nimrod, MD  methocarbamol (ROBAXIN) 500 MG tablet Take 1 tablet (500 mg total) by mouth every 6 (six) hours as needed for up to 5 days for muscle spasms. 08/16/21 08/21/21  Pokhrel, Corrie Mckusick, MD  ondansetron (ZOFRAN) 4 MG tablet Take 1 tablet (4 mg total) by mouth every 6 (six) hours as needed for nausea. 08/16/21   Pokhrel, Corrie Mckusick, MD  oxyCODONE-acetaminophen (PERCOCET) 7.5-325 MG tablet Take 1 tablet by mouth every 6 (six) hours as needed for moderate pain or severe pain. 08/16/21 08/16/22  Pokhrel, Corrie Mckusick, MD  pantoprazole (PROTONIX) 40 MG tablet Take 1 tablet (40 mg total) by mouth daily. 07/26/21   Emeterio Reeve, DO    Social History   Socioeconomic History   Marital status: Divorced    Spouse name: Not on file   Number of children: 1   Years of education: Not on file   Highest education level: Not on file  Occupational History    Employer: [employer]  Tobacco Use   Smoking status: Light Smoker    Packs/day: 0.25    Years: 22.00    Total pack years: 5.50    Types: Cigarettes   Smokeless tobacco: Never  Vaping Use   Vaping Use: Never used  Substance and Sexual Activity   Alcohol use: Yes    Alcohol/week: 0.0 standard drinks of alcohol    Comment: sociial   Drug use: No   Sexual activity: Yes    Partners: Male  Other Topics Concern   Not on file  Social History Narrative   Lives at home with her daughter   Right handed   Caffeine: 3 cups daily   Social Determinants of Health   Financial Resource Strain: Not on file  Food Insecurity: Not on file  Transportation Needs: Not on file  Physical Activity: Not on file  Stress: Not on file  Social Connections: Not on file  Intimate Partner Violence: Not on file     Family History  Problem Relation Age of Onset   Diabetes Mother    Heart attack Mother    Heart disease Mother        before age 5   Hypertension Mother    Hyperlipidemia Mother    Diabetes Father  Heart attack Father    Heart disease Father         before age 81   Diabetes Sister    Hypertension Sister     ROS: Otherwise negative unless mentioned in HPI  Physical Examination  Vitals:   08/21/21 0920 08/21/21 0930  BP: (!) 133/48 (!) 133/48  Pulse: 67   Resp: 18   Temp:    SpO2: 97% 99%   Body mass index is 31.54 kg/m.  General:  WDWN in NAD Gait: Not observed, in HD chair HENT: WNL, normocephalic Pulmonary: normal non-labored breathing, without wheezing Cardiac: regular rate and rhythm Vascular Exam/Pulses: 2+ left radial pulse, left hand warm and well perfused Extremities: left AV fistula with palpable thrill, running on HD machine. No pulsatility. Dampened bruit on auscultation Neurologic: A&O X 3 Psychiatric:  The pt has Normal affect.  CBC    Component Value Date/Time   WBC 10.2 08/21/2021 0900   RBC 2.81 (L) 08/21/2021 0900   HGB 8.3 (L) 08/21/2021 0900   HGB 12.0 10/29/2018 1439   HCT 26.4 (L) 08/21/2021 0900   HCT 36.6 10/29/2018 1439   PLT 303 08/21/2021 0900   PLT 345 10/29/2018 1439   MCV 94.0 08/21/2021 0900   MCV 84 10/29/2018 1439   MCH 29.5 08/21/2021 0900   MCHC 31.4 08/21/2021 0900   RDW 15.5 08/21/2021 0900   RDW 13.4 10/29/2018 1439   LYMPHSABS 0.8 08/11/2021 0154   LYMPHSABS 2.6 10/29/2018 1439   MONOABS 0.7 08/11/2021 0154   EOSABS 0.0 08/11/2021 0154   EOSABS 0.2 10/29/2018 1439   BASOSABS 0.0 08/11/2021 0154   BASOSABS 0.1 10/29/2018 1439    BMET    Component Value Date/Time   NA 127 (L) 08/21/2021 0900   NA 140 10/29/2018 1439   K 5.2 (H) 08/21/2021 0900   CL 91 (L) 08/21/2021 0900   CO2 26 08/21/2021 0900   GLUCOSE 171 (H) 08/21/2021 0900   BUN 39 (H) 08/21/2021 0900   BUN 43 (H) 10/29/2018 1439   CREATININE 5.12 (H) 08/21/2021 0900   CREATININE 1.92 (H) 01/10/2016 1528   CALCIUM 8.7 (L) 08/21/2021 0900   CALCIUM 8.3 (L) 01/11/2020 0139   GFRNONAA 9 (L) 08/21/2021 0900   GFRNONAA 30 (L) 01/10/2016 1528   GFRAA 13 (L) 12/17/2018 0907   GFRAA 35 (L) 01/10/2016 1528     COAGS: Lab Results  Component Value Date   INR 1.1 12/28/2019   INR 1.2 12/17/2018   INR 0.99 04/07/2017     ASSESSMENT/PLAN: This is a 56 y.o. female with ESRD with left BC AVF initially created on 01/09/20 by Dr. Stanford Breed. Fistula has continued to be difficult to cannulate and today during HD they were pulling clots from arterial side, however she is able to complete her treatment. She will need fistulogram to evaluate her fistula. She is agreeable now to proceed. The on call Vascular surgeon, Dr. Trula Slade will see patient later this afternoon to provide further details of timing of intervention. Tentatively will add her to the PV schedule for tomorrow with Dr. Tracey Harries PA-C [facility] [phone] 08/21/2021  9:56 AM  I agree with the above.  We will try to get her fistulogram done tomorrow.  She will be n.p.o. after midnight.  Annamarie Major

## 2021-08-21 NOTE — Progress Notes (Addendum)
[facility] Progress Note   Subjective: Up in chair for HD. Continued issues with AVF, pulling clots from AVF today, had to be stuck twice. Finally cannulated, tolerating HD without issues.    Objective Vitals:   08/21/21 0757 08/21/21 0857 08/21/21 0920 08/21/21 0930  BP: (!) 142/53 (!) 156/62 (!) 133/48 (!) 133/48  Pulse: 65 68 67   Resp: 18 18 18    Temp: 97.6 F (36.4 C) 98.3 F (36.8 C)    TempSrc: Oral Oral    SpO2: 97% 100% 97% 99%  Weight:      Height:       Physical Exam General: Chronically ill appearing obese female in NAD Heart: S1,S2 No M/R/G. SR on monitor.  Lungs: CTAB No WOB.  Abdomen: Obese, NABS, NT Extremities: L BKA in ACE wrap. 1-2+ edema Dialysis Access: AVF cannulated. Pulling clots from AVF.   Additional Objective Labs: Basic Metabolic Panel: Recent Labs  Lab 08/18/21 0145 08/19/21 0209 08/20/21 1258  NA 125* 126* 127*  K 3.9 3.9 4.8  CL 92* 90* 90*  CO2 24 27 28   GLUCOSE 160* 155* 238*  BUN 29* 42* 32*  CREATININE 4.57* 5.58* 4.58*  CALCIUM 8.0* 8.3* 8.3*  PHOS  --   --  3.0   Liver Function Tests: Recent Labs  Lab 08/20/21 1258  ALBUMIN 2.1*   No results for input(s): "LIPASE", "AMYLASE" in the last 168 hours. CBC: Recent Labs  Lab 08/15/21 0145 08/16/21 0150 08/18/21 0145 08/19/21 0209 08/21/21 0900  WBC 8.6 8.1 9.1 11.6* 10.2  HGB 7.6* 7.4* 7.6* 7.6* 8.3*  HCT 22.9* 23.2* 23.4* 24.1* 26.4*  MCV 89.1 90.3 91.8 92.0 94.0  PLT 209 248 246 287 303   Blood Culture    Component Value Date/Time   SDES TISSUE 08/10/2021 0822   SPECREQUEST LEFT HIP WOUND 08/10/2021 0822   CULT  08/10/2021 7741    RARE STAPHYLOCOCCUS AUREUS CRITICAL RESULT CALLED TO, READ BACK BY AND VERIFIED WITH: RN [name] [id number] FCP NO ANAEROBES ISOLATED Performed at [facility], [place] [address] [place], [place] [address]    REPTSTATUS 08/15/2021 FINAL 08/10/2021 2947    Cardiac Enzymes: No results for input(s):  "CKTOTAL", "CKMB", "CKMBINDEX", "TROPONINI" in the last 168 hours. CBG: Recent Labs  Lab 08/20/21 1610 08/20/21 1954 08/21/21 0011 08/21/21 0439 08/21/21 0800  GLUCAP 257* 199* 171* 182* 150*   Iron Studies: No results for input(s): "IRON", "TIBC", "TRANSFERRIN", "FERRITIN" in the last 72 hours. @lablastinr3 @ Studies/Results: No results found. Medications:  sodium chloride 10 mL/hr at 08/12/21 1629    ceFAZolin (ANCEF) IV 2 g (08/19/21 1727)   methocarbamol (ROBAXIN) IV      (feeding supplement) PROSource Plus  30 mL Oral BID BM   alosetron  1 mg Oral BID   amitriptyline  25 mg Oral QHS   amLODipine  5 mg Oral Daily   atorvastatin  40 mg Oral q1800   carvedilol  6.25 mg Oral BID   Chlorhexidine Gluconate Cloth  6 each Topical Q0600   darbepoetin (ARANESP) injection - DIALYSIS  150 mcg Intravenous Q Fri-HD   docusate sodium  100 mg Oral BID   DULoxetine  60 mg Oral Daily   hydrALAZINE  50 mg Oral TID   insulin aspart  0-6 Units Subcutaneous Q4H   magnesium oxide  400 mg Oral BID   pantoprazole  40 mg Oral BID     Dialysis Orders: [facility]-MWF 4hrs; BFR 400; DFR 500;  2K/2Ca; EDW 83kg   Assessment/Plan: 1.  Left hip infection with hardware involvement and recent left femoral condyle fracture: S/p L hip nail removal, partial resection of L femur for osteo, and resection of infected soft tissue. Cx + MSSA. Will need Cefazolin 2g IV q HD until 09/22/21. Also on PO metronidazole BID. 2.  End-stage renal disease: Continue HD per usual MWF schedule. Next HD 08/23/2021.  3.  Hypertension: BP high with edema and hyponatremia. Na 126, peripheral edema present. Net UFG 3.8 L 08/19/2021. Continue lowering volume as tolerated.  4.  Anemia: Hgb 8.3. Aranesp ^ to 196mcg on 6/16, will increase dose again. Follow HGB. No evidence of overt bleeding present. Transfuse if HGB < 7.0 5.  Secondary hyperparathyroidism:  CorrCa/Phos ok. No binders. 6. Nutrition: Alb very low,  continue supplements. 7. AVF issues: Have asked VVS to see patient D/T issues cannulating Seen by VVS 06/12 however when seen by Dr. Trula Slade, patient refused F'gram. AVF DID work 08/19/2021 however pulling clots from AVF today, had to be stuck twice. She is agreeable to F'gram. VVS reconsulted.  8. H/O severe GERD-on dexlansoprazole at home which is not on formulary here. Increase pantoprazole to 40 mg PO BID. Patient reports improvement in Sx.     [name] NP-C 08/21/2021, 9:37 AM  [employer] [phone]   Seen and examined independently.  Agree with note and exam as documented above by physician extender and as noted here.  She and I spoke this am and she agreed to HD in a chair.  She states she hadn't felt well due to nausea yesterday and I noted that she was charted as not having worked with PT.  She wants to get better.    General adult female in bed in no acute distress HEENT normocephalic atraumatic extraocular movements intact sclera anicteric Neck supple trachea midline Lungs clear to auscultation bilaterally normal work of breathing at rest  Heart S1S2 no rub Abdomen soft nontender nondistended Extremities 1+ RLE edema left BKA, leg wrapped  Psych normal mood and affect Neuro - alert and oriented x 3 provides hx and follows commands Access LUE AVF bruit and thrill    Left hip infection abx as above. Per charting on Cefazolin 2g IV q HD until 09/22/21  ESRD on HD - HD per MWF schedule. She tolerated HD in a chair today and I let her know that I appreciated it   HTN - optimize volume with HD   Hyponatremia - Optimizing volume with HD and then assess if medication changes needed   Anemia CKD - aranesp was increased to 150 mcg weekly on 6/16  AVF clotting - we are getting a fistulogram. Pulled clots again from AVF.   Appreciate vascular.   Claudia Desanctis, MD 08/21/2021  3:23 PM

## 2021-08-21 NOTE — Progress Notes (Addendum)
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 (prior toe amputation [place] potation 2014 2015) status post IM nail 12/29/2019--underwent irrigation debridement left hip previously 01/19/2020 and was treated with ertapenem 2/2 Enterobacter/ Bacteroides Bipolar  Patient fell hit the trying to transfer from wheelchair to recliner 5/19 witnessed--found to have distal femoral fracture deemed nonoperative ---did not hit head-missed dialysis since 5/12--on arrival potassium 6.1--given Veltassa insulin dextrose and nephrology consulted  5/29: Spontaneous superficial abscess left lateral aspect hip with rupture-orthopedics consulted-did not want operative 5/30: ID consulted secondary to patient not wishing operative management-patient started on meropenem 6/2:-Declined CT-guided drainage procedure 6/6:-CT hip showed ununited left hip fracture, 6 cm posterior fluid collection abscess connecting to sinus tract to the skin probable septic arthritis 6/10: Partial resection of femur for osteomyelitis resection of infected soft tissue local tissue rearrangement 20 X 15 cm--cultures growing Staph aureus--ID changed ABX to cefepime and oral Flagyl 6/11--->09/22/2021 6/12-difficulty cannulating fistula vascular consulted fistulogram recommended   Hospital-Problem based course  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 Lajoyce Corners 6/10 Long-term antibiotics cefazolin through 09/22/2021 Long-term antibiotics as above to complete 6 weeks Pain control Oxy IR every 4 as needed 5-10, 10 to 15 mg pain 7 and above Dilaudid IV for uncontrollable pain--have explained to her that cannot get IV pain meds at OP--will limit to 2 more  days Nonfunctioning dialysis fistula Patient declined fistulogram earlier Vascular consulted and will evaluate with fistulogram 6/22 PM ESRD HD MWF Hyponatremia, hypomagnesemia Managed by nephrology---Phosphorus okay tolerating HD sitting --should be stabilizing to go SNF in next several days--bed available Postoperative anemia status post 3 units PRBC transfusion this hospital stay Continue ESA per nephrology iron level 173 saturation ratios adequate 84 HTN Continue amlodipine 5, Coreg 6.25 twice daily, hydralazine 50 3 times daily DM TY 2 CBGs ranging 150-180 continue sliding scale Lantus 12 held from admission, short acting cover 6 units with meals held   DVT prophylaxis: SCD Code Status: Full Family Communication: None present at bedside Disposition:  Status is: Inpatient    Consultants:  Renal  Procedures: Multiple  Antimicrobials:  Ancef and Flagyl   Subjective:  Asking if she can go to rehab--doesn't want to go to SNF No cp No fever  No cough cold Looks like fistula was finally able to be cannulated today Going for fistulogram tomorrow pm   Objective: Vitals:   08/20/21 1624 08/20/21 1709 08/20/21 1956 08/21/21 0442  BP:   (!) 130/50 (!) 127/52  Pulse:   60 65  Resp: 18 19 18 17   Temp:   98.5 F (36.9 C)   TempSrc:      SpO2:   98% 95%  Weight:      Height:        Intake/Output Summary (Last 24 hours) at 08/21/2021 0746 Last data filed at 08/20/2021 1337 Gross per 24 hour  Intake --  Output 900 ml  Net -900 ml   Filed Weights   08/16/21 1338 08/19/21 0714 08/19/21 1114  Weight: 102.1 kg 103.6 kg 99.7 kg    Examination:  Eomi ncat no focal deficit, flat affect Cta b no added sound Access in LUE Abd soft nt nd no rebound Ne le edema--L BKA noted  Data Reviewed: personally reviewed   CBC  Component Value Date/Time   WBC 11.6 (H) 08/19/2021 0209   RBC 2.62 (L) 08/19/2021 0209   HGB 7.6 (L) 08/19/2021 0209   HGB 12.0 10/29/2018  1439   HCT 24.1 (L) 08/19/2021 0209   HCT 36.6 10/29/2018 1439   PLT 287 08/19/2021 0209   PLT 345 10/29/2018 1439   MCV 92.0 08/19/2021 0209   MCV 84 10/29/2018 1439   MCH 29.0 08/19/2021 0209   MCHC 31.5 08/19/2021 0209   RDW 14.7 08/19/2021 0209   RDW 13.4 10/29/2018 1439   LYMPHSABS 0.8 08/11/2021 0154   LYMPHSABS 2.6 10/29/2018 1439   MONOABS 0.7 08/11/2021 0154   EOSABS 0.0 08/11/2021 0154   EOSABS 0.2 10/29/2018 1439   BASOSABS 0.0 08/11/2021 0154   BASOSABS 0.1 10/29/2018 1439      Latest Ref Rng & Units 08/20/2021   12:58 PM 08/19/2021    2:09 AM 08/18/2021    1:45 AM  CMP  Glucose 70 - 99 mg/dL 086  578  469   BUN 6 - 20 mg/dL 32  42  29   Creatinine 0.44 - 1.00 mg/dL 6.29  5.28  4.13   Sodium 135 - 145 mmol/L 127  126  125   Potassium 3.5 - 5.1 mmol/L 4.8  3.9  3.9   Chloride 98 - 111 mmol/L 90  90  92   CO2 22 - 32 mmol/L 28  27  24    Calcium 8.9 - 10.3 mg/dL 8.3  8.3  8.0      Radiology Studies: No results found.   Scheduled Meds:  (feeding supplement) PROSource Plus  30 mL Oral BID BM   alosetron  1 mg Oral BID   amitriptyline  25 mg Oral QHS   amLODipine  5 mg Oral Daily   atorvastatin  40 mg Oral q1800   carvedilol  6.25 mg Oral BID   Chlorhexidine Gluconate Cloth  6 each Topical Q0600   darbepoetin (ARANESP) injection - DIALYSIS  150 mcg Intravenous Q Fri-HD   docusate sodium  100 mg Oral BID   DULoxetine  60 mg Oral Daily   hydrALAZINE  50 mg Oral TID   insulin aspart  0-6 Units Subcutaneous Q4H   magnesium oxide  400 mg Oral BID   pantoprazole  40 mg Oral BID   Continuous Infusions:  sodium chloride 10 mL/hr at 08/12/21 1629    ceFAZolin (ANCEF) IV 2 g (08/19/21 1727)   methocarbamol (ROBAXIN) IV       LOS: 14 days   Time spent: 59  Rhetta Mura, MD [facility] To contact the attending provider between 7A-7P or the covering provider during after hours 7P-7A, please log into the web site [email or web address] and access using  universal [place] password for that web site. If you do not have the password, please call the hospital operator.  08/21/2021, 7:46 AM

## 2021-08-21 NOTE — Progress Notes (Signed)
Pharmacy Antibiotic Note  [name] is a 56 y.o. female admitted on 08/07/2021 with L-hip PJI. Pharmacy has been consulted for Cefazolin dosing.  6/10 intra-op cultures growing MSSA - narrowed antibiotics. HD 6/12 with Cefepime dose that evening. Will provide coverage until after the patient's next HD session -     Plan: Continue Cefazolin 2 G IV qHD-MWF after HD through 7/23 - next dose due 6/21.  - Will continue to follow HD schedule/duration.  Height: 5\' 10"  (177.8 cm) Weight: 99.7 kg (219 lb 12.8 oz) IBW/kg (Calculated) : 68.5  Temp (24hrs), Avg:98.3 F (36.8 C), Min:97.6 F (36.4 C), Max:98.8 F (37.1 C)  Recent Labs  Lab 08/15/21 0145 08/16/21 0150 08/18/21 0145 08/19/21 0209 08/20/21 1258  WBC 8.6 8.1 9.1 11.6*  --   CREATININE 3.22* 4.29* 4.57* 5.58* 4.58*     Estimated Creatinine Clearance: 18 mL/min (A) (by C-G formula based on SCr of 4.58 mg/dL (H)).    Allergies  Allergen Reactions   Trazodone Swelling   Latex Rash   Lidocaine Itching    Antimicrobials this admission: Azithro 5/27 >> 5/29 Cefepime 5/27 >> 5/29;   6/9>>6/13 Meropenem 5/30 >> 6/8 Cefepime 6/9 >> 6/13 Metronidazole 6/9 >> Vanc 6/11>>6/13 Flagyl 6/9>> 6/19 Cefazolin 6/13>> (7/23)   Microbiology results: 6/10 L-hip tissue cx >> MSSA, no anaerobes seen.   [name], PharmD, BCPS, FNKF Clinical Pharmacist [place] Please utilize Amion for appropriate phone number to reach the unit pharmacist ([place])

## 2021-08-21 NOTE — TOC Progression Note (Signed)
Transition of Care [facility]) - Progression Note    Patient Details  Name: GERMANY DODGEN MRN: [id number] Date of Birth: 01/23/1966  Transition of Care [facility] CM/SW Contact  Joanne Chars, LCSW Phone Number: 08/21/2021, 9:38 AM  Clinical Narrative:   CSW spoke with Sharyn Lull at [facility], they do have bed and can accept pt today.    Expected Discharge Plan: Skilled Nursing Facility Barriers to Discharge: Continued Medical Work up  Expected Discharge Plan and Services Expected Discharge Plan: [place] In-house Referral: Clinical Social Work Discharge Planning Services: CM Consult Post Acute Care Choice: [place] arrangements for the past 2 months: Single Family Home Expected Discharge Date: 08/16/21                                     Social Determinants of Health (SDOH) Interventions    Readmission Risk Interventions    07/26/2021    3:15 PM  Readmission Risk Prevention Plan  Transportation Screening Complete  PCP or Specialist Appt within 3-5 Days Complete  HRI or Home Care Consult Complete  Palliative Care Screening Not Applicable  Medication Review (RN Care Manager) Referral to Pharmacy

## 2021-08-21 NOTE — Progress Notes (Signed)
Received patient in bed, alert and oriented. Informed consent signed and in chart. Complaints of 8/10 pain in left hip. Advised will reassess and provide medication therapy after HD treatment begins. She understood and agreed.   Time tx initiated: 0920   Pre HD weight: n/a. Unable to obtain d/t BKA. Patient completed treatment in bariatric chair.   Pre HD VS: BP: 156/62  Temp: 98.3 Resp: 18 O2: 100 RA HR: 68   BFR reduced to 350 at approx 11am due to clotting in HD machine chamber. No signs of distress noted from patient. Cartridge changed due to clotting a total of 2 times during treatment. Patient spoke to providers regarding fistulagram in left AVF that is scheduled for tomorrow.   Time tx completed: 1432  HD treatment completed. Over, patient tolerated treatment in bariatric wheelchair well without signs and symptoms of complications. Patient transported back to the room, alert and orient and in no acute distress. Report given to bedside RN.  Total UF removed: 4L      Medication given: Oxycodone 10mg  given at 0922 and 1403 for left hip pain   Post HD VS: 131/55 BP 98.8 Temp Resp 18 O2 99%   Post HD weight:

## 2021-08-21 NOTE — Progress Notes (Signed)
Dressing changed again due to drainage saturation

## 2021-08-21 NOTE — Progress Notes (Signed)
Interventional Radiology Brief Note:  IR consulted for possible fistulogram due to cannulation issues 08/16/21, however patient well-established with vascular service who was also consulted for assessment.  Since this time, patient has been able to successfully undergo HD without further documented difficulty.    IR remains available if needed or warranted by Vascular service should issues recur.   Ordering service and nephrology notified.   Brynda Greathouse, MS RD PA-C

## 2021-08-21 NOTE — Progress Notes (Addendum)
Advised that pt is sitting in chair for HD today. Contacted inpt HD unit to request that HD RN please document this information so it can be provided to [facility] admissions/clinic. Once this documentation is received by [facility], will have to await final clearance/acceptance by clinic. CSW made aware of this info. Pt will not be provided a schedule until pt has been cleared/accepted.   Melven Sartorius Renal Navigator [phone]  Addendum at 10:44 am: Renal note faxed to [facility] Admissions with documentation that pt received HD in chair today.   Addendum at 3:45 pm: Pt has been accepted at [facility] ([address] [place]) on MWF. Pt will need to arrive at 5:30 am for 6:00 chair time. Made CSW aware of this info so snf can be contacted to confirm that they can transport pt at those appt times. Awaiting response from CSW once snf contacted. Clinic is saying they can start pt on Monday, June 26. Inquiring if pt could possibly start Friday if pt has procedure tomorrow and snf can transport at above times. Update provided to nephrologist and renal NP.

## 2021-08-22 ENCOUNTER — Encounter (HOSPITAL_COMMUNITY)
Admission: AD | Disposition: A | Discharge status: Skilled Nursing Facility | Payer: Self-pay | Source: Other Acute Inpatient Hospital | Attending: Internal Medicine

## 2021-08-22 ENCOUNTER — Encounter (HOSPITAL_COMMUNITY): Payer: Self-pay | Admitting: Vascular Surgery

## 2021-08-22 DIAGNOSIS — M00059 Staphylococcal arthritis, unspecified hip: Secondary | ICD-10-CM | POA: Diagnosis not present

## 2021-08-22 HISTORY — PX: A/V FISTULAGRAM: CATH118298

## 2021-08-22 LAB — CBC WITH DIFFERENTIAL/PLATELET
Abs Immature Granulocytes: 0.09 10*3/uL — ABNORMAL HIGH (ref 0.00–0.07)
Basophils Absolute: 0.1 10*3/uL (ref 0.0–0.1)
Basophils Relative: 1 %
Eosinophils Absolute: 0.6 10*3/uL — ABNORMAL HIGH (ref 0.0–0.5)
Eosinophils Relative: 7 %
HCT: 24.9 % — ABNORMAL LOW (ref 36.0–46.0)
Hemoglobin: 7.8 g/dL — ABNORMAL LOW (ref 12.0–15.0)
Immature Granulocytes: 1 %
Lymphocytes Relative: 21 %
Lymphs Abs: 1.9 10*3/uL (ref 0.7–4.0)
MCH: 29.8 pg (ref 26.0–34.0)
MCHC: 31.3 g/dL (ref 30.0–36.0)
MCV: 95 fL (ref 80.0–100.0)
Monocytes Absolute: 1.4 10*3/uL — ABNORMAL HIGH (ref 0.1–1.0)
Monocytes Relative: 15 %
Neutro Abs: 4.9 10*3/uL (ref 1.7–7.7)
Neutrophils Relative %: 55 %
Platelets: 269 10*3/uL (ref 150–400)
RBC: 2.62 MIL/uL — ABNORMAL LOW (ref 3.87–5.11)
RDW: 15.7 % — ABNORMAL HIGH (ref 11.5–15.5)
WBC: 9 10*3/uL (ref 4.0–10.5)
nRBC: 0 % (ref 0.0–0.2)

## 2021-08-22 LAB — GLUCOSE, CAPILLARY
Glucose-Capillary: 167 mg/dL — ABNORMAL HIGH (ref 70–99)
Glucose-Capillary: 170 mg/dL — ABNORMAL HIGH (ref 70–99)
Glucose-Capillary: 188 mg/dL — ABNORMAL HIGH (ref 70–99)
Glucose-Capillary: 191 mg/dL — ABNORMAL HIGH (ref 70–99)
Glucose-Capillary: 193 mg/dL — ABNORMAL HIGH (ref 70–99)
Glucose-Capillary: 199 mg/dL — ABNORMAL HIGH (ref 70–99)
Glucose-Capillary: 205 mg/dL — ABNORMAL HIGH (ref 70–99)

## 2021-08-22 LAB — RENAL FUNCTION PANEL
Albumin: 2 g/dL — ABNORMAL LOW (ref 3.5–5.0)
Anion gap: 7 (ref 5–15)
BUN: 23 mg/dL — ABNORMAL HIGH (ref 6–20)
CO2: 29 mmol/L (ref 22–32)
Calcium: 8 mg/dL — ABNORMAL LOW (ref 8.9–10.3)
Chloride: 92 mmol/L — ABNORMAL LOW (ref 98–111)
Creatinine, Ser: 3.62 mg/dL — ABNORMAL HIGH (ref 0.44–1.00)
GFR, Estimated: 14 mL/min — ABNORMAL LOW (ref >60)
Glucose, Bld: 189 mg/dL — ABNORMAL HIGH (ref 70–99)
Phosphorus: 3.2 mg/dL (ref 2.5–4.6)
Potassium: 4.3 mmol/L (ref 3.5–5.1)
Sodium: 128 mmol/L — ABNORMAL LOW (ref 135–145)

## 2021-08-22 SURGERY — A/V FISTULAGRAM
Anesthesia: LOCAL | Laterality: Left

## 2021-08-22 MED ORDER — MIDAZOLAM HCL 2 MG/2ML IJ SOLN
INTRAMUSCULAR | Status: AC
  Filled 2021-08-22: qty 2

## 2021-08-22 MED ORDER — HEPARIN (PORCINE) IN NACL 1000-0.9 UT/500ML-% IV SOLN
INTRAVENOUS | Status: DC | PRN
  Administered 2021-08-22: 500 mL

## 2021-08-22 MED ORDER — INSULIN GLARGINE-YFGN 100 UNIT/ML ~~LOC~~ SOLN
4.0000 [IU] | Freq: Every day | SUBCUTANEOUS | Status: DC
  Administered 2021-08-22: 4 [IU] via SUBCUTANEOUS
  Filled 2021-08-22 (×2): qty 0.04

## 2021-08-22 MED ORDER — IODIXANOL 320 MG/ML IV SOLN
INTRAVENOUS | Status: DC | PRN
  Administered 2021-08-22: 45 mL

## 2021-08-22 MED ORDER — LIDOCAINE HCL (PF) 1 % IJ SOLN
INTRAMUSCULAR | Status: DC | PRN
  Administered 2021-08-22: 2 mL

## 2021-08-22 MED ORDER — LIDOCAINE HCL (PF) 1 % IJ SOLN
INTRAMUSCULAR | Status: AC
  Filled 2021-08-22: qty 30

## 2021-08-22 MED ORDER — MIDAZOLAM HCL 2 MG/2ML IJ SOLN
INTRAMUSCULAR | Status: DC | PRN
  Administered 2021-08-22: 1 mg via INTRAVENOUS

## 2021-08-22 MED ORDER — FENTANYL CITRATE (PF) 100 MCG/2ML IJ SOLN
INTRAMUSCULAR | Status: AC
  Filled 2021-08-22: qty 2

## 2021-08-22 MED ORDER — FENTANYL CITRATE (PF) 100 MCG/2ML IJ SOLN
INTRAMUSCULAR | Status: DC | PRN
  Administered 2021-08-22: 50 ug via INTRAVENOUS

## 2021-08-22 MED ORDER — SODIUM CHLORIDE 0.9 % IV SOLN
INTRAVENOUS | Status: AC | PRN
  Administered 2021-08-22: 10 mL/h via INTRAVENOUS

## 2021-08-22 SURGICAL SUPPLY — 10 items
BAG SNAP BAND KOVER 36X36 (MISCELLANEOUS) ×2 IMPLANT
COVER DOME SNAP 22 D (MISCELLANEOUS) ×2 IMPLANT
KIT MICROPUNCTURE NIT STIFF (SHEATH) ×2 IMPLANT
MAT PREVALON FULL STRYKER (MISCELLANEOUS) ×1 IMPLANT
PROTECTION STATION PRESSURIZED (MISCELLANEOUS) ×2
SHEATH PROBE COVER 6X72 (BAG) ×2 IMPLANT
STATION PROTECTION PRESSURIZED (MISCELLANEOUS) ×1 IMPLANT
STOPCOCK MORSE 400PSI 3WAY (MISCELLANEOUS) ×2 IMPLANT
TRAY PV CATH (CUSTOM PROCEDURE TRAY) ×2 IMPLANT
TUBING CIL FLEX 10 FLL-RA (TUBING) ×2 IMPLANT

## 2021-08-22 NOTE — Progress Notes (Signed)
OT Cancellation Note  Patient Details Name: [name] MRN: [id number] DOB: 12/19/1965   Cancelled Treatment:    Reason Eval/Treat Not Completed: Patient declined, no reason specified Pt's RN, Joe, present to educate and motivate pt to participate in therapy. Pt reports her only priorities right now are her kidneys and diabetes. Pt reports "not even thinking about getting out of bed today". Educated pt that this is the 2nd documented refusal for OT and that a 3rd refusal warrants signing off.   Layla Maw 08/22/2021, 8:55 AM

## 2021-08-22 NOTE — Progress Notes (Signed)
PT Cancellation Note  Patient Details Name: [name] MRN: [id number] DOB: 02-07-1966   Cancelled Treatment:    Reason Eval/Treat Not Completed: Other (comment) (Patient declined due to wanting to speak to doctor. Patient educated on risks of immobility but still not willing to participate.)   Donna Bernard 08/22/2021, 11:49 AM

## 2021-08-22 NOTE — Progress Notes (Signed)
CSW confirms that pt's snf can transport pt to HD clinic to arrive at 5:30 for 6:00 chair time. Inquired about possible d/c date. Advised by CSW that pt will not d/c today per MD. Pt is scheduled to start at [facility] on Monday. Attempted to reach clinic to make sure they are aware of pt's iv abx needs with HD but there was no answer at the clinic. Will continue efforts to reach clinic staff. Spoke to pt via phone to make her aware of clinic's acceptance and schedule/times. Pt states she would like to receive inpt rehab and plans to discuss with MD tomorrow. Will assist as needed.   Olivia Canter Renal Navigator [phone]

## 2021-08-22 NOTE — Progress Notes (Signed)
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 [place] potation 2014 2015) status post IM nail 12/29/2019--underwent irrigation debridement left hip previously 01/19/2020 and was treated with ertapenem 2/2 Enterobacter/ Bacteroides Bipolar  Patient fell hit the trying to transfer from wheelchair to recliner 5/19 witnessed--found to have distal femoral fracture deemed nonoperative ---did not hit head-missed dialysis since 5/12--on arrival potassium 6.1--Martinez [place] insulin dextrose and nephrology consulted  5/29: Spontaneous superficial abscess left lateral aspect hip with rupture-orthopedics consulted-did not want operative 5/30: ID consulted secondary to patient not wishing operative management-patient started on meropenem 6/2:-Declined CT-guided drainage procedure 6/6:-CT hip showed ununited left hip fracture, 6 cm posterior fluid collection abscess connecting to sinus tract to the skin probable septic arthritis 6/10: Partial resection of femur for osteomyelitis resection of infected soft tissue local tissue rearrangement 20 X 15 cm--cultures growing Staph aureus--ID changed ABX to cefepime and oral Flagyl 6/11--->09/22/2021 6/12-difficulty cannulating fistula vascular consulted fistulogram recommended 6/13-fistulogram performed-fistula working well-continued drainage from left hip noted on exam orthopedics reconsulted   Hospital-Problem based course  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 Sharol Martinez 6/10 Long-term antibiotics cefepime with HD/Flagyl through 09/22/2021 Long-term antibiotics as above to complete 6 weeks Pain control Oxy IR every 4 as needed 5-10, 10 to 15 mg pain 7 and  above Dilaudid IV for uncontrollable pain--have explained to her that cannot get IV pain meds at OP--will limit to 2 more days Continue drainage from left hip Have asked Dr. Sharol Martinez to reassess the patient in consult?  Wound VAC placement  nonfunctioning dialysis fistula Patient declined fistulogram earlier fistulogram 6/22 PM ESRD HD MWF Hyponatremia, hypomagnesemia Managed by nephrology---Phosphorus okay tolerating HD sitting --stabilizing to SNF in next several days--bed available Postoperative anemia status post 3 units PRBC transfusion this hospital stay Continue ESA per nephrology iron level 173 saturation ratios adequate 84 HTN Continue amlodipine 5, Coreg 6.25 twice daily, hydralazine 50 3 times daily DM TY 2 CBGs ranging 167-205, only on sliding scale Lantus 12 held initially resumed at 4 units Hold mealtime insulin for now  DVT prophylaxis: SCD Code Status: Full Family Communication: None present at bedside Disposition:  Status is: Inpatient    Consultants:  Renal  Procedures: Multiple  Antimicrobials:  Ancef and Flagyl   Subjective:  Many questions about the drainage from left hip also tell me she wants to know the plan with regards to her knee which has a fracture No chest pain no fever   Objective: Vitals:   08/22/21 0954 08/22/21 0959 08/22/21 1004 08/22/21 1023  BP: (!) 166/65 (!) 164/67 (!) 162/64 (!) 135/55  Pulse: 69 70 73 67  Resp: 17 14 19 18   Temp:    98.8 F (37.1 C)  TempSrc:    Oral  SpO2: 96% 96% 96% 99%  Weight:      Height:        Intake/Output Summary (Last 24 hours) at 08/22/2021 1655 Last data filed at 08/22/2021 1407 Gross per 24 hour  Intake 817 ml  Output --  Net 817 ml    Filed Weights   08/16/21 1338 08/19/21 0714 08/19/21 1114  Weight: 102.1 kg 103.6 kg 99.7 kg    Examination:  Eomi ncat no focal deficit, flat affect Cta b  no added sound Access in LUE Abd soft nt nd no rebound Left lower extremity  examined-dressings removed and there is dampness as well as frank fluid coming out the area of the left hip incision  Data Reviewed: personally reviewed   CBC    Component Value Date/Time   WBC 9.0 08/22/2021 0322   RBC 2.62 (L) 08/22/2021 0322   HGB 7.8 (L) 08/22/2021 0322   HGB 12.0 10/29/2018 1439   HCT 24.9 (L) 08/22/2021 0322   HCT 36.6 10/29/2018 1439   PLT 269 08/22/2021 0322   PLT 345 10/29/2018 1439   MCV 95.0 08/22/2021 0322   MCV 84 10/29/2018 1439   MCH 29.8 08/22/2021 0322   MCHC 31.3 08/22/2021 0322   RDW 15.7 (H) 08/22/2021 0322   RDW 13.4 10/29/2018 1439   LYMPHSABS 1.9 08/22/2021 0322   LYMPHSABS 2.6 10/29/2018 1439   MONOABS 1.4 (H) 08/22/2021 0322   EOSABS 0.6 (H) 08/22/2021 0322   EOSABS 0.2 10/29/2018 1439   BASOSABS 0.1 08/22/2021 0322   BASOSABS 0.1 10/29/2018 1439      Latest Ref Rng & Units 08/22/2021    3:22 AM 08/21/2021    9:00 AM 08/20/2021   12:58 PM  CMP  Glucose 70 - 99 mg/dL 189  171  238   BUN 6 - 20 mg/dL 23  39  32   Creatinine 0.44 - 1.00 mg/dL 3.62  5.12  4.58   Sodium 135 - 145 mmol/L 128  127  127   Potassium 3.5 - 5.1 mmol/L 4.3  5.2  4.8   Chloride 98 - 111 mmol/L 92  91  90   CO2 22 - 32 mmol/L 29  26  28    Calcium 8.9 - 10.3 mg/dL 8.0  8.7  8.3      Radiology Studies: PERIPHERAL VASCULAR CATHETERIZATION  Result Date: 08/22/2021 DATE OF SERVICE: 08/22/2021  PATIENT:  [name]  56 y.o. female  PRE-OPERATIVE DIAGNOSIS:  ESRD, difficulty with left arm fistula cannulation  POST-OPERATIVE DIAGNOSIS:  Same  PROCEDURE:  Left arm fistulagram (54mL contrast) Conscious sedation (20 minutes)  SURGEON:  Surgeon(s) and Role:    * Donna Robins, MD - Primary  ASSISTANT: none  ANESTHESIA:   local and IV sedation  EBL: minimal  BLOOD ADMINISTERED:none  DRAINS: none  LOCAL MEDICATIONS USED:  LIDOCAINE  SPECIMEN:  none  COUNTS: confirmed correct.  TOURNIQUET:  none  PATIENT DISPOSITION:  PACU - hemodynamically stable.  Delay start of  Pharmacological VTE agent (>24hrs) due to surgical blood loss or risk of bleeding: no  INDICATION FOR PROCEDURE: [name] is a 56 y.o. female with left arm brachiocephalic arteriovenous fistula which has been difficult to cannulate. After careful discussion of risks, benefits, and alternatives the patient was offered fistulagram. The patient  understood and wished to proceed.  OPERATIVE FINDINGS: healthy fistula without flow limiting stenosis. Large proximal sidebranch prohibits refluxing of contrast across the anastomosis. No technical explanation for difficulty with cannulation.  DESCRIPTION OF PROCEDURE: After identification of the patient in the pre-operative holding area, the patient was transferred to the operating room. The patient was positioned supine on the operating room table. Anesthesia was induced. The left arm was prepped and draped in standard fashion. A surgical pause was performed confirming correct patient, procedure, and operative location.  Using micropuncture technique, the left arm fistula was accessed.  An 014 wire was threaded into the fistula.  The needle was withdrawn.  A micro sheath was  introduced into the fistula.  Fistulogram was performed in stations.  There is no evidence of central venous stenosis.  No evidence of stenosis at the cephalic arch.  There was a large proximal sidebranch.  Otherwise the fistula appeared totally normal.  The sheath was removed and a figure-of-eight stitch placed around the access.  Stasis was achieved  Upon completion of the case instrument and sharps counts were confirmed correct. The patient was transferred to the PACU in good condition. I was present for all portions of the procedure.  Yevonne Aline. Stanford Breed, MD [facility] Phone Number: [phone] 08/22/2021 10:03 AM      Scheduled Meds:  (feeding supplement) PROSource Plus  30 mL Oral BID BM   alosetron  1 mg Oral BID   amitriptyline  25 mg Oral QHS    amLODipine  5 mg Oral Daily   atorvastatin  40 mg Oral q1800   carvedilol  6.25 mg Oral BID   Chlorhexidine Gluconate Cloth  6 each Topical Q0600   [START ON 08/23/2021] darbepoetin (ARANESP) injection - DIALYSIS  200 mcg Intravenous Q Fri-HD   docusate sodium  100 mg Oral BID   DULoxetine  60 mg Oral Daily   hydrALAZINE  50 mg Oral TID   insulin aspart  0-6 Units Subcutaneous Q4H   magnesium oxide  400 mg Oral BID   pantoprazole  40 mg Oral BID   Continuous Infusions:  sodium chloride 10 mL/hr at 08/12/21 1629    ceFAZolin (ANCEF) IV Stopped (08/19/21 1757)   methocarbamol (ROBAXIN) IV       LOS: 15 days   Time spent: [place], MD [facility] To contact the attending provider between 7A-7P or the covering provider during after hours 7P-7A, please log into the web site [email or web address] and access using universal [place] password for that web site. If you do not have the password, please call the hospital operator.  08/22/2021, 4:55 PM

## 2021-08-22 NOTE — Interval H&P Note (Signed)
History and Physical Interval Note:  08/22/2021 9:25 AM  [name]  has presented today for surgery, with the diagnosis of difficult to access.  The various methods of treatment have been discussed with the patient and family. After consideration of risks, benefits and other options for treatment, the patient has consented to  Procedure(s): A/V Fistulagram (Left) as a surgical intervention.  The patient's history has been reviewed, patient examined, no change in status, stable for surgery.  I have reviewed the patient's chart and labs.  Questions were answered to the patient's satisfaction.     Cherre Robins

## 2021-08-22 NOTE — TOC Progression Note (Signed)
Transition of Care [facility]) - Progression Note    Patient Details  Name: [name] MRN: [id number] Date of Birth: Aug 08, 1965  Transition of Care [facility] CM/SW Contact  Joanne Chars, LCSW Phone Number: 08/22/2021, 10:26 AM  Clinical Narrative:   CSW spoke with Nira Conn at [facility] regarding HD at 64am.  Nira Conn confirmed that they can get pt there at this time, said the clinic is actually across the street from SNF.    Expected Discharge Plan: Skilled Nursing Facility Barriers to Discharge: Continued Medical Work up  Expected Discharge Plan and Services Expected Discharge Plan: [place] In-house Referral: Clinical Social Work Discharge Planning Services: CM Consult Post Acute Care Choice: [place] arrangements for the past 2 months: Single Family Home Expected Discharge Date: 08/16/21                                     Social Determinants of Health (SDOH) Interventions    Readmission Risk Interventions    07/26/2021    3:15 PM  Readmission Risk Prevention Plan  Transportation Screening Complete  PCP or Specialist Appt within 3-5 Days Complete  HRI or Home Care Consult Complete  Palliative Care Screening Not Applicable  Medication Review (RN Care Manager) Referral to Pharmacy

## 2021-08-23 DIAGNOSIS — M00059 Staphylococcal arthritis, unspecified hip: Secondary | ICD-10-CM | POA: Diagnosis not present

## 2021-08-23 LAB — CBC
HCT: 25.6 % — ABNORMAL LOW (ref 36.0–46.0)
Hemoglobin: 8 g/dL — ABNORMAL LOW (ref 12.0–15.0)
MCH: 29.2 pg (ref 26.0–34.0)
MCHC: 31.3 g/dL (ref 30.0–36.0)
MCV: 93.4 fL (ref 80.0–100.0)
Platelets: 263 10*3/uL (ref 150–400)
RBC: 2.74 MIL/uL — ABNORMAL LOW (ref 3.87–5.11)
RDW: 15.4 % (ref 11.5–15.5)
WBC: 9.7 10*3/uL (ref 4.0–10.5)
nRBC: 0 % (ref 0.0–0.2)

## 2021-08-23 LAB — CBC WITH DIFFERENTIAL/PLATELET
Abs Immature Granulocytes: 0.1 10*3/uL — ABNORMAL HIGH (ref 0.00–0.07)
Basophils Absolute: 0.1 10*3/uL (ref 0.0–0.1)
Basophils Relative: 1 %
Eosinophils Absolute: 0.6 10*3/uL — ABNORMAL HIGH (ref 0.0–0.5)
Eosinophils Relative: 7 %
HCT: 25.4 % — ABNORMAL LOW (ref 36.0–46.0)
Hemoglobin: 8 g/dL — ABNORMAL LOW (ref 12.0–15.0)
Immature Granulocytes: 1 %
Lymphocytes Relative: 17 %
Lymphs Abs: 1.6 10*3/uL (ref 0.7–4.0)
MCH: 29.5 pg (ref 26.0–34.0)
MCHC: 31.5 g/dL (ref 30.0–36.0)
MCV: 93.7 fL (ref 80.0–100.0)
Monocytes Absolute: 1.3 10*3/uL — ABNORMAL HIGH (ref 0.1–1.0)
Monocytes Relative: 14 %
Neutro Abs: 5.6 10*3/uL (ref 1.7–7.7)
Neutrophils Relative %: 60 %
Platelets: 242 10*3/uL (ref 150–400)
RBC: 2.71 MIL/uL — ABNORMAL LOW (ref 3.87–5.11)
RDW: 15.4 % (ref 11.5–15.5)
WBC: 9.2 10*3/uL (ref 4.0–10.5)
nRBC: 0 % (ref 0.0–0.2)

## 2021-08-23 LAB — COMPREHENSIVE METABOLIC PANEL
ALT: <5 U/L (ref 0–44)
AST: 13 U/L — ABNORMAL LOW (ref 15–41)
Albumin: 2.1 g/dL — ABNORMAL LOW (ref 3.5–5.0)
Alkaline Phosphatase: 162 U/L — ABNORMAL HIGH (ref 38–126)
Anion gap: 9 (ref 5–15)
BUN: 39 mg/dL — ABNORMAL HIGH (ref 6–20)
CO2: 26 mmol/L (ref 22–32)
Calcium: 8.1 mg/dL — ABNORMAL LOW (ref 8.9–10.3)
Chloride: 91 mmol/L — ABNORMAL LOW (ref 98–111)
Creatinine, Ser: 4.77 mg/dL — ABNORMAL HIGH (ref 0.44–1.00)
GFR, Estimated: 10 mL/min — ABNORMAL LOW (ref >60)
Glucose, Bld: 179 mg/dL — ABNORMAL HIGH (ref 70–99)
Potassium: 4.4 mmol/L (ref 3.5–5.1)
Sodium: 126 mmol/L — ABNORMAL LOW (ref 135–145)
Total Bilirubin: 0.7 mg/dL (ref 0.3–1.2)
Total Protein: 5.7 g/dL — ABNORMAL LOW (ref 6.5–8.1)

## 2021-08-23 LAB — RENAL FUNCTION PANEL
Albumin: 2.2 g/dL — ABNORMAL LOW (ref 3.5–5.0)
Anion gap: 12 (ref 5–15)
BUN: 42 mg/dL — ABNORMAL HIGH (ref 6–20)
CO2: 26 mmol/L (ref 22–32)
Calcium: 8.4 mg/dL — ABNORMAL LOW (ref 8.9–10.3)
Chloride: 89 mmol/L — ABNORMAL LOW (ref 98–111)
Creatinine, Ser: 4.86 mg/dL — ABNORMAL HIGH (ref 0.44–1.00)
GFR, Estimated: 10 mL/min — ABNORMAL LOW (ref >60)
Glucose, Bld: 164 mg/dL — ABNORMAL HIGH (ref 70–99)
Phosphorus: 4.4 mg/dL (ref 2.5–4.6)
Potassium: 4.8 mmol/L (ref 3.5–5.1)
Sodium: 127 mmol/L — ABNORMAL LOW (ref 135–145)

## 2021-08-23 LAB — GLUCOSE, CAPILLARY
Glucose-Capillary: 174 mg/dL — ABNORMAL HIGH (ref 70–99)
Glucose-Capillary: 161 mg/dL — ABNORMAL HIGH (ref 70–99)

## 2021-08-23 MED ORDER — HYDRALAZINE HCL 50 MG PO TABS: 50.0000 mg | ORAL_TABLET | Freq: Three times a day (TID) | ORAL | 2 refills | Status: DC

## 2021-08-23 MED ORDER — PENTAFLUOROPROP-TETRAFLUOROETH EX AERO
1.0000 | INHALATION_SPRAY | CUTANEOUS | Status: DC | PRN
  Filled 2021-08-23: qty 103.5

## 2021-08-23 MED ORDER — HEPARIN SODIUM (PORCINE) 1000 UNIT/ML DIALYSIS
4000.0000 [IU] | Freq: Once | INTRAMUSCULAR | Status: AC
  Administered 2021-08-23: 4000 [IU] via INTRAVENOUS_CENTRAL
  Filled 2021-08-23: qty 4

## 2021-08-23 MED ORDER — INSULIN GLARGINE-YFGN 100 UNIT/ML ~~LOC~~ SOLN
4.0000 [IU] | Freq: Every day | SUBCUTANEOUS | 11 refills | Status: DC
Start: 2021-08-23 — End: 2022-03-18

## 2021-08-24 ENCOUNTER — Telehealth: Payer: Self-pay | Admitting: Nephrology

## 2021-08-26 ENCOUNTER — Encounter (HOSPITAL_COMMUNITY): Payer: Self-pay

## 2021-08-26 ENCOUNTER — Inpatient Hospital Stay (HOSPITAL_COMMUNITY)
Admission: EM | Admit: 2021-08-26 | Discharge: 2022-03-18 | DRG: 463 | Disposition: A | Discharge status: Home Health | Payer: Medicare Other | Attending: Internal Medicine | Admitting: Internal Medicine

## 2021-08-26 ENCOUNTER — Inpatient Hospital Stay (HOSPITAL_COMMUNITY): Payer: Medicare Other

## 2021-08-26 ENCOUNTER — Other Ambulatory Visit: Payer: Self-pay

## 2021-08-26 ENCOUNTER — Emergency Department (HOSPITAL_COMMUNITY): Payer: Medicare Other

## 2021-08-26 DIAGNOSIS — F17210 Nicotine dependence, cigarettes, uncomplicated: Secondary | ICD-10-CM | POA: Diagnosis present

## 2021-08-26 DIAGNOSIS — R5381 Other malaise: Secondary | ICD-10-CM | POA: Diagnosis not present

## 2021-08-26 DIAGNOSIS — S98912A Complete traumatic amputation of left foot, level unspecified, initial encounter: Secondary | ICD-10-CM | POA: Diagnosis present

## 2021-08-26 DIAGNOSIS — B952 Enterococcus as the cause of diseases classified elsewhere: Secondary | ICD-10-CM | POA: Diagnosis present

## 2021-08-26 DIAGNOSIS — N2581 Secondary hyperparathyroidism of renal origin: Secondary | ICD-10-CM | POA: Diagnosis present

## 2021-08-26 DIAGNOSIS — F419 Anxiety disorder, unspecified: Secondary | ICD-10-CM | POA: Diagnosis present

## 2021-08-26 DIAGNOSIS — E11649 Type 2 diabetes mellitus with hypoglycemia without coma: Secondary | ICD-10-CM | POA: Diagnosis present

## 2021-08-26 DIAGNOSIS — B368 Other specified superficial mycoses: Secondary | ICD-10-CM | POA: Diagnosis not present

## 2021-08-26 DIAGNOSIS — Z5320 Procedure and treatment not carried out because of patient's decision for unspecified reasons: Secondary | ICD-10-CM | POA: Diagnosis present

## 2021-08-26 DIAGNOSIS — E876 Hypokalemia: Secondary | ICD-10-CM | POA: Diagnosis not present

## 2021-08-26 DIAGNOSIS — Z7189 Other specified counseling: Secondary | ICD-10-CM | POA: Diagnosis not present

## 2021-08-26 DIAGNOSIS — E669 Obesity, unspecified: Secondary | ICD-10-CM | POA: Diagnosis present

## 2021-08-26 DIAGNOSIS — G4700 Insomnia, unspecified: Secondary | ICD-10-CM | POA: Diagnosis present

## 2021-08-26 DIAGNOSIS — Z888 Allergy status to other drugs, medicaments and biological substances status: Secondary | ICD-10-CM

## 2021-08-26 DIAGNOSIS — E1169 Type 2 diabetes mellitus with other specified complication: Secondary | ICD-10-CM | POA: Diagnosis present

## 2021-08-26 DIAGNOSIS — K2100 Gastro-esophageal reflux disease with esophagitis, without bleeding: Secondary | ICD-10-CM | POA: Diagnosis not present

## 2021-08-26 DIAGNOSIS — T8131XS Disruption of external operation (surgical) wound, not elsewhere classified, sequela: Secondary | ICD-10-CM | POA: Diagnosis not present

## 2021-08-26 DIAGNOSIS — Z91118 Patient's noncompliance with dietary regimen for other reason: Secondary | ICD-10-CM

## 2021-08-26 DIAGNOSIS — Z833 Family history of diabetes mellitus: Secondary | ICD-10-CM

## 2021-08-26 DIAGNOSIS — Z1611 Resistance to penicillins: Secondary | ICD-10-CM | POA: Diagnosis present

## 2021-08-26 DIAGNOSIS — L02416 Cutaneous abscess of left lower limb: Secondary | ICD-10-CM | POA: Diagnosis present

## 2021-08-26 DIAGNOSIS — I10 Essential (primary) hypertension: Secondary | ICD-10-CM | POA: Diagnosis not present

## 2021-08-26 DIAGNOSIS — Z515 Encounter for palliative care: Secondary | ICD-10-CM

## 2021-08-26 DIAGNOSIS — N250 Renal osteodystrophy: Secondary | ICD-10-CM | POA: Diagnosis present

## 2021-08-26 DIAGNOSIS — Z794 Long term (current) use of insulin: Secondary | ICD-10-CM

## 2021-08-26 DIAGNOSIS — Y838 Other surgical procedures as the cause of abnormal reaction of the patient, or of later complication, without mention of misadventure at the time of the procedure: Secondary | ICD-10-CM | POA: Diagnosis present

## 2021-08-26 DIAGNOSIS — K219 Gastro-esophageal reflux disease without esophagitis: Secondary | ICD-10-CM | POA: Diagnosis present

## 2021-08-26 DIAGNOSIS — Z79899 Other long term (current) drug therapy: Secondary | ICD-10-CM

## 2021-08-26 DIAGNOSIS — X58XXXA Exposure to other specified factors, initial encounter: Secondary | ICD-10-CM | POA: Diagnosis present

## 2021-08-26 DIAGNOSIS — M86252 Subacute osteomyelitis, left femur: Secondary | ICD-10-CM | POA: Diagnosis not present

## 2021-08-26 DIAGNOSIS — D62 Acute posthemorrhagic anemia: Secondary | ICD-10-CM | POA: Diagnosis not present

## 2021-08-26 DIAGNOSIS — D631 Anemia in chronic kidney disease: Secondary | ICD-10-CM | POA: Diagnosis present

## 2021-08-26 DIAGNOSIS — R0789 Other chest pain: Secondary | ICD-10-CM | POA: Diagnosis not present

## 2021-08-26 DIAGNOSIS — W19XXXD Unspecified fall, subsequent encounter: Secondary | ICD-10-CM | POA: Diagnosis present

## 2021-08-26 DIAGNOSIS — Z8673 Personal history of transient ischemic attack (TIA), and cerebral infarction without residual deficits: Secondary | ICD-10-CM

## 2021-08-26 DIAGNOSIS — S91109A Unspecified open wound of unspecified toe(s) without damage to nail, initial encounter: Secondary | ICD-10-CM

## 2021-08-26 DIAGNOSIS — E1140 Type 2 diabetes mellitus with diabetic neuropathy, unspecified: Secondary | ICD-10-CM | POA: Diagnosis present

## 2021-08-26 DIAGNOSIS — N185 Chronic kidney disease, stage 5: Secondary | ICD-10-CM | POA: Diagnosis not present

## 2021-08-26 DIAGNOSIS — F17200 Nicotine dependence, unspecified, uncomplicated: Secondary | ICD-10-CM | POA: Diagnosis present

## 2021-08-26 DIAGNOSIS — S72142S Displaced intertrochanteric fracture of left femur, sequela: Secondary | ICD-10-CM | POA: Diagnosis not present

## 2021-08-26 DIAGNOSIS — G894 Chronic pain syndrome: Secondary | ICD-10-CM | POA: Diagnosis present

## 2021-08-26 DIAGNOSIS — K5903 Drug induced constipation: Secondary | ICD-10-CM | POA: Diagnosis not present

## 2021-08-26 DIAGNOSIS — E875 Hyperkalemia: Secondary | ICD-10-CM | POA: Diagnosis not present

## 2021-08-26 DIAGNOSIS — Z89512 Acquired absence of left leg below knee: Secondary | ICD-10-CM

## 2021-08-26 DIAGNOSIS — T84621S Infection and inflammatory reaction due to internal fixation device of left femur, sequela: Secondary | ICD-10-CM | POA: Diagnosis not present

## 2021-08-26 DIAGNOSIS — Y841 Kidney dialysis as the cause of abnormal reaction of the patient, or of later complication, without mention of misadventure at the time of the procedure: Secondary | ICD-10-CM | POA: Diagnosis not present

## 2021-08-26 DIAGNOSIS — D509 Iron deficiency anemia, unspecified: Secondary | ICD-10-CM | POA: Diagnosis present

## 2021-08-26 DIAGNOSIS — Z91040 Latex allergy status: Secondary | ICD-10-CM

## 2021-08-26 DIAGNOSIS — E871 Hypo-osmolality and hyponatremia: Secondary | ICD-10-CM | POA: Diagnosis present

## 2021-08-26 DIAGNOSIS — N186 End stage renal disease: Secondary | ICD-10-CM | POA: Diagnosis present

## 2021-08-26 DIAGNOSIS — Z1621 Resistance to vancomycin: Secondary | ICD-10-CM | POA: Diagnosis present

## 2021-08-26 DIAGNOSIS — L089 Local infection of the skin and subcutaneous tissue, unspecified: Secondary | ICD-10-CM | POA: Diagnosis not present

## 2021-08-26 DIAGNOSIS — D72829 Elevated white blood cell count, unspecified: Secondary | ICD-10-CM | POA: Diagnosis present

## 2021-08-26 DIAGNOSIS — L03115 Cellulitis of right lower limb: Secondary | ICD-10-CM | POA: Diagnosis not present

## 2021-08-26 DIAGNOSIS — Z992 Dependence on renal dialysis: Secondary | ICD-10-CM

## 2021-08-26 DIAGNOSIS — E1022 Type 1 diabetes mellitus with diabetic chronic kidney disease: Secondary | ICD-10-CM | POA: Diagnosis not present

## 2021-08-26 DIAGNOSIS — E785 Hyperlipidemia, unspecified: Secondary | ICD-10-CM | POA: Diagnosis present

## 2021-08-26 DIAGNOSIS — B9561 Methicillin susceptible Staphylococcus aureus infection as the cause of diseases classified elsewhere: Secondary | ICD-10-CM | POA: Diagnosis not present

## 2021-08-26 DIAGNOSIS — E8770 Fluid overload, unspecified: Secondary | ICD-10-CM | POA: Diagnosis not present

## 2021-08-26 DIAGNOSIS — Z91119 Patient's noncompliance with dietary regimen due to unspecified reason: Secondary | ICD-10-CM

## 2021-08-26 DIAGNOSIS — M1990 Unspecified osteoarthritis, unspecified site: Secondary | ICD-10-CM | POA: Diagnosis not present

## 2021-08-26 DIAGNOSIS — F418 Other specified anxiety disorders: Secondary | ICD-10-CM | POA: Diagnosis not present

## 2021-08-26 DIAGNOSIS — G43909 Migraine, unspecified, not intractable, without status migrainosus: Secondary | ICD-10-CM | POA: Diagnosis not present

## 2021-08-26 DIAGNOSIS — Z91198 Patient's noncompliance with other medical treatment and regimen for other reason: Secondary | ICD-10-CM

## 2021-08-26 DIAGNOSIS — F32A Depression, unspecified: Secondary | ICD-10-CM | POA: Diagnosis not present

## 2021-08-26 DIAGNOSIS — S8391XA Sprain of unspecified site of right knee, initial encounter: Secondary | ICD-10-CM | POA: Diagnosis present

## 2021-08-26 DIAGNOSIS — I120 Hypertensive chronic kidney disease with stage 5 chronic kidney disease or end stage renal disease: Secondary | ICD-10-CM | POA: Diagnosis present

## 2021-08-26 DIAGNOSIS — F319 Bipolar disorder, unspecified: Secondary | ICD-10-CM | POA: Diagnosis present

## 2021-08-26 DIAGNOSIS — Z5889 Other problems related to physical environment: Secondary | ICD-10-CM

## 2021-08-26 DIAGNOSIS — R601 Generalized edema: Secondary | ICD-10-CM | POA: Diagnosis not present

## 2021-08-26 DIAGNOSIS — E1165 Type 2 diabetes mellitus with hyperglycemia: Secondary | ICD-10-CM | POA: Diagnosis present

## 2021-08-26 DIAGNOSIS — K582 Mixed irritable bowel syndrome: Secondary | ICD-10-CM | POA: Diagnosis present

## 2021-08-26 DIAGNOSIS — Z8249 Family history of ischemic heart disease and other diseases of the circulatory system: Secondary | ICD-10-CM

## 2021-08-26 DIAGNOSIS — T82590A Other mechanical complication of surgically created arteriovenous fistula, initial encounter: Secondary | ICD-10-CM | POA: Diagnosis not present

## 2021-08-26 DIAGNOSIS — M1711 Unilateral primary osteoarthritis, right knee: Secondary | ICD-10-CM | POA: Diagnosis present

## 2021-08-26 DIAGNOSIS — T8130XA Disruption of wound, unspecified, initial encounter: Secondary | ICD-10-CM | POA: Diagnosis not present

## 2021-08-26 DIAGNOSIS — M549 Dorsalgia, unspecified: Secondary | ICD-10-CM | POA: Diagnosis not present

## 2021-08-26 DIAGNOSIS — S72302A Unspecified fracture of shaft of left femur, initial encounter for closed fracture: Secondary | ICD-10-CM

## 2021-08-26 DIAGNOSIS — T40605A Adverse effect of unspecified narcotics, initial encounter: Secondary | ICD-10-CM | POA: Diagnosis not present

## 2021-08-26 DIAGNOSIS — E66811 Obesity, class 1: Secondary | ICD-10-CM | POA: Diagnosis present

## 2021-08-26 DIAGNOSIS — K30 Functional dyspepsia: Secondary | ICD-10-CM | POA: Diagnosis not present

## 2021-08-26 DIAGNOSIS — Z6831 Body mass index (BMI) 31.0-31.9, adult: Secondary | ICD-10-CM

## 2021-08-26 DIAGNOSIS — T148XXA Other injury of unspecified body region, initial encounter: Secondary | ICD-10-CM | POA: Diagnosis not present

## 2021-08-26 DIAGNOSIS — Y831 Surgical operation with implant of artificial internal device as the cause of abnormal reaction of the patient, or of later complication, without mention of misadventure at the time of the procedure: Secondary | ICD-10-CM | POA: Diagnosis present

## 2021-08-26 DIAGNOSIS — Z83438 Family history of other disorder of lipoprotein metabolism and other lipidemia: Secondary | ICD-10-CM

## 2021-08-26 DIAGNOSIS — T8789 Other complications of amputation stump: Secondary | ICD-10-CM | POA: Diagnosis not present

## 2021-08-26 DIAGNOSIS — S72412A Displaced unspecified condyle fracture of lower end of left femur, initial encounter for closed fracture: Secondary | ICD-10-CM | POA: Diagnosis present

## 2021-08-26 DIAGNOSIS — T82898A Other specified complication of vascular prosthetic devices, implants and grafts, initial encounter: Secondary | ICD-10-CM | POA: Diagnosis not present

## 2021-08-26 DIAGNOSIS — S98912S Complete traumatic amputation of left foot, level unspecified, sequela: Secondary | ICD-10-CM | POA: Diagnosis not present

## 2021-08-26 DIAGNOSIS — M85852 Other specified disorders of bone density and structure, left thigh: Secondary | ICD-10-CM | POA: Diagnosis present

## 2021-08-26 DIAGNOSIS — E1122 Type 2 diabetes mellitus with diabetic chronic kidney disease: Secondary | ICD-10-CM | POA: Diagnosis present

## 2021-08-26 DIAGNOSIS — T8131XA Disruption of external operation (surgical) wound, not elsewhere classified, initial encounter: Secondary | ICD-10-CM | POA: Diagnosis not present

## 2021-08-26 DIAGNOSIS — R066 Hiccough: Secondary | ICD-10-CM | POA: Diagnosis not present

## 2021-08-26 DIAGNOSIS — M89552 Osteolysis, left thigh: Secondary | ICD-10-CM | POA: Diagnosis present

## 2021-08-26 DIAGNOSIS — M25561 Pain in right knee: Secondary | ICD-10-CM | POA: Diagnosis present

## 2021-08-26 DIAGNOSIS — Z91158 Patient's noncompliance with renal dialysis for other reason: Secondary | ICD-10-CM

## 2021-08-26 DIAGNOSIS — Z993 Dependence on wheelchair: Secondary | ICD-10-CM

## 2021-08-26 LAB — CBC WITH DIFFERENTIAL/PLATELET
Abs Immature Granulocytes: 0.1 10*3/uL — ABNORMAL HIGH (ref 0.00–0.07)
Basophils Absolute: 0.2 10*3/uL — ABNORMAL HIGH (ref 0.0–0.1)
Basophils Relative: 1 %
Eosinophils Absolute: 0.4 10*3/uL (ref 0.0–0.5)
Eosinophils Relative: 3 %
HCT: 29.3 % — ABNORMAL LOW (ref 36.0–46.0)
Hemoglobin: 8.7 g/dL — ABNORMAL LOW (ref 12.0–15.0)
Immature Granulocytes: 1 %
Lymphocytes Relative: 9 %
Lymphs Abs: 1.1 10*3/uL (ref 0.7–4.0)
MCH: 29.1 pg (ref 26.0–34.0)
MCHC: 29.7 g/dL — ABNORMAL LOW (ref 30.0–36.0)
MCV: 98 fL (ref 80.0–100.0)
Monocytes Absolute: 1.1 10*3/uL — ABNORMAL HIGH (ref 0.1–1.0)
Monocytes Relative: 9 %
Neutro Abs: 10.2 10*3/uL — ABNORMAL HIGH (ref 1.7–7.7)
Neutrophils Relative %: 77 %
Platelets: 269 10*3/uL (ref 150–400)
RBC: 2.99 MIL/uL — ABNORMAL LOW (ref 3.87–5.11)
RDW: 15.8 % — ABNORMAL HIGH (ref 11.5–15.5)
WBC: 13.1 10*3/uL — ABNORMAL HIGH (ref 4.0–10.5)
nRBC: 0 % (ref 0.0–0.2)

## 2021-08-26 LAB — BASIC METABOLIC PANEL
Anion gap: 15 (ref 5–15)
BUN: 41 mg/dL — ABNORMAL HIGH (ref 6–20)
CO2: 25 mmol/L (ref 22–32)
Calcium: 8.4 mg/dL — ABNORMAL LOW (ref 8.9–10.3)
Chloride: 93 mmol/L — ABNORMAL LOW (ref 98–111)
Creatinine, Ser: 6.41 mg/dL — ABNORMAL HIGH (ref 0.44–1.00)
GFR, Estimated: 7 mL/min — ABNORMAL LOW (ref >60)
Glucose, Bld: 191 mg/dL — ABNORMAL HIGH (ref 70–99)
Potassium: 4.4 mmol/L (ref 3.5–5.1)
Sodium: 133 mmol/L — ABNORMAL LOW (ref 135–145)

## 2021-08-26 LAB — GLUCOSE, CAPILLARY
Glucose-Capillary: 126 mg/dL — ABNORMAL HIGH (ref 70–99)
Glucose-Capillary: 226 mg/dL — ABNORMAL HIGH (ref 70–99)

## 2021-08-26 LAB — LACTIC ACID, PLASMA: Lactic Acid, Venous: 1.2 mmol/L (ref 0.5–1.9)

## 2021-08-26 MED ORDER — AMLODIPINE BESYLATE 5 MG PO TABS
5.0000 mg | ORAL_TABLET | Freq: Every day | ORAL | Status: DC
  Administered 2021-08-26 – 2021-09-24 (×28): 5 mg via ORAL
  Filled 2021-08-26 (×29): qty 1

## 2021-08-26 MED ORDER — PANTOPRAZOLE SODIUM 40 MG PO TBEC
80.0000 mg | DELAYED_RELEASE_TABLET | Freq: Every day | ORAL | Status: DC
  Administered 2021-08-26 – 2022-02-12 (×168): 80 mg via ORAL
  Filled 2021-08-26 (×171): qty 2

## 2021-08-26 MED ORDER — SODIUM CHLORIDE 0.9 % IV SOLN: 250.0000 mL | INTRAVENOUS | Status: DC | PRN

## 2021-08-26 MED ORDER — DIALYVITE 800/ZINC 0.8 MG PO TABS: 1.0000 | ORAL_TABLET | Freq: Every day | ORAL | Status: DC

## 2021-08-26 MED ORDER — ACETAMINOPHEN 650 MG RE SUPP: 650.0000 mg | Freq: Four times a day (QID) | RECTAL | Status: DC | PRN

## 2021-08-26 MED ORDER — HEPARIN SODIUM (PORCINE) 5000 UNIT/ML IJ SOLN
5000.0000 [IU] | Freq: Three times a day (TID) | INTRAMUSCULAR | Status: DC
  Administered 2021-08-26 – 2021-12-10 (×298): 5000 [IU] via SUBCUTANEOUS
  Filled 2021-08-26 (×285): qty 1

## 2021-08-26 MED ORDER — OXYCODONE HCL 5 MG PO TABS
5.0000 mg | ORAL_TABLET | Freq: Once | ORAL | Status: AC
  Administered 2021-08-26: 5 mg via ORAL
  Filled 2021-08-26: qty 1

## 2021-08-26 MED ORDER — CEFAZOLIN SODIUM-DEXTROSE 1-4 GM/50ML-% IV SOLN
1.0000 g | Freq: Once | INTRAVENOUS | Status: AC
  Administered 2021-08-27: 1 g via INTRAVENOUS
  Filled 2021-08-26 (×2): qty 50

## 2021-08-26 MED ORDER — INSULIN GLARGINE-YFGN 100 UNIT/ML ~~LOC~~ SOLN
8.0000 [IU] | Freq: Every day | SUBCUTANEOUS | Status: DC
  Filled 2021-08-26: qty 0.08

## 2021-08-26 MED ORDER — AMITRIPTYLINE HCL 25 MG PO TABS
25.0000 mg | ORAL_TABLET | Freq: Every day | ORAL | Status: DC
  Administered 2021-08-26 – 2022-01-15 (×143): 25 mg via ORAL
  Filled 2021-08-26 (×147): qty 1

## 2021-08-26 MED ORDER — INSULIN GLARGINE-YFGN 100 UNIT/ML ~~LOC~~ SOLN
5.0000 [IU] | Freq: Every day | SUBCUTANEOUS | Status: DC
  Administered 2021-08-26 – 2021-08-29 (×4): 5 [IU] via SUBCUTANEOUS
  Filled 2021-08-26 (×5): qty 0.05

## 2021-08-26 MED ORDER — HYDRALAZINE HCL 20 MG/ML IJ SOLN
10.0000 mg | Freq: Three times a day (TID) | INTRAMUSCULAR | Status: DC | PRN
  Administered 2021-08-26: 10 mg via INTRAVENOUS
  Filled 2021-08-26: qty 1

## 2021-08-26 MED ORDER — DIPHENOXYLATE-ATROPINE 2.5-0.025 MG PO TABS: 1.0000 | ORAL_TABLET | Freq: Two times a day (BID) | ORAL | Status: DC | PRN

## 2021-08-26 MED ORDER — INSULIN ASPART 100 UNIT/ML IJ SOLN
0.0000 [IU] | Freq: Three times a day (TID) | INTRAMUSCULAR | Status: DC
  Administered 2021-08-27 (×2): 2 [IU] via SUBCUTANEOUS
  Administered 2021-08-28 (×2): 1 [IU] via SUBCUTANEOUS
  Administered 2021-08-29: 5 [IU] via SUBCUTANEOUS
  Administered 2021-08-29: 1 [IU] via SUBCUTANEOUS
  Administered 2021-08-29: 2 [IU] via SUBCUTANEOUS
  Administered 2021-08-30: 1 [IU] via SUBCUTANEOUS
  Administered 2021-08-31: 5 [IU] via SUBCUTANEOUS
  Administered 2021-08-31: 1 [IU] via SUBCUTANEOUS
  Administered 2021-09-01: 5 [IU] via SUBCUTANEOUS
  Administered 2021-09-01: 6 [IU] via SUBCUTANEOUS
  Administered 2021-09-01 – 2021-09-02 (×2): 3 [IU] via SUBCUTANEOUS
  Administered 2021-09-03 – 2021-09-26 (×24): 1 [IU] via SUBCUTANEOUS
  Administered 2021-09-27: 2 [IU] via SUBCUTANEOUS
  Administered 2021-09-28: 1 [IU] via SUBCUTANEOUS
  Administered 2021-09-28: 2 [IU] via SUBCUTANEOUS
  Administered 2021-09-29: 1 [IU] via SUBCUTANEOUS
  Administered 2021-10-02: 2 [IU] via SUBCUTANEOUS
  Administered 2021-10-03: 1 [IU] via SUBCUTANEOUS
  Administered 2021-10-04: 2 [IU] via SUBCUTANEOUS
  Administered 2021-10-04 – 2021-10-11 (×10): 1 [IU] via SUBCUTANEOUS
  Administered 2021-10-12: 2 [IU] via SUBCUTANEOUS
  Administered 2021-10-13 – 2021-10-15 (×3): 1 [IU] via SUBCUTANEOUS
  Administered 2021-10-16: 3 [IU] via SUBCUTANEOUS
  Administered 2021-10-17: 2 [IU] via SUBCUTANEOUS
  Administered 2021-10-21 – 2021-10-22 (×2): 1 [IU] via SUBCUTANEOUS
  Administered 2021-10-23: 2 [IU] via SUBCUTANEOUS
  Administered 2021-10-23 – 2021-10-27 (×6): 1 [IU] via SUBCUTANEOUS
  Administered 2021-10-28: 2 [IU] via SUBCUTANEOUS
  Administered 2021-10-29 – 2021-11-07 (×13): 1 [IU] via SUBCUTANEOUS
  Administered 2021-11-08: 2 [IU] via SUBCUTANEOUS
  Administered 2021-11-09 – 2021-11-12 (×7): 1 [IU] via SUBCUTANEOUS
  Administered 2021-11-13: 3 [IU] via SUBCUTANEOUS
  Administered 2021-11-13: 1 [IU] via SUBCUTANEOUS
  Administered 2021-11-14: 2 [IU] via SUBCUTANEOUS

## 2021-08-26 MED ORDER — CYCLOBENZAPRINE HCL 10 MG PO TABS
10.0000 mg | ORAL_TABLET | Freq: Three times a day (TID) | ORAL | Status: DC
  Administered 2021-08-26 – 2021-12-29 (×336): 10 mg via ORAL
  Filled 2021-08-26 (×101): qty 1
  Filled 2021-08-26: qty 2
  Filled 2021-08-26 (×241): qty 1

## 2021-08-26 MED ORDER — HYDRALAZINE HCL 50 MG PO TABS
50.0000 mg | ORAL_TABLET | Freq: Three times a day (TID) | ORAL | Status: DC
Start: 2021-08-26 — End: 2021-10-17
  Administered 2021-08-26 – 2021-10-17 (×133): 50 mg via ORAL
  Filled 2021-08-26 (×139): qty 1

## 2021-08-26 MED ORDER — ALOSETRON HCL 1 MG PO TABS
1.0000 mg | ORAL_TABLET | Freq: Two times a day (BID) | ORAL | Status: DC
  Administered 2021-08-29 – 2022-01-25 (×228): 1 mg via ORAL
  Filled 2021-08-26 (×290): qty 1

## 2021-08-26 MED ORDER — SODIUM CHLORIDE 0.9% FLUSH
3.0000 mL | INTRAVENOUS | Status: DC | PRN
  Administered 2021-10-23 – 2021-11-14 (×3): 3 mL via INTRAVENOUS

## 2021-08-26 MED ORDER — FENTANYL CITRATE PF 50 MCG/ML IJ SOSY
75.0000 ug | PREFILLED_SYRINGE | Freq: Once | INTRAMUSCULAR | Status: AC
  Administered 2021-08-26: 75 ug via INTRAVENOUS
  Filled 2021-08-26: qty 2

## 2021-08-26 MED ORDER — ATORVASTATIN CALCIUM 40 MG PO TABS
40.0000 mg | ORAL_TABLET | Freq: Every day | ORAL | Status: DC
  Administered 2021-08-27 – 2021-09-27 (×30): 40 mg via ORAL
  Filled 2021-08-26 (×31): qty 1

## 2021-08-26 MED ORDER — CARVEDILOL 6.25 MG PO TABS
6.2500 mg | ORAL_TABLET | Freq: Two times a day (BID) | ORAL | Status: DC
  Administered 2021-08-26 – 2021-12-26 (×218): 6.25 mg via ORAL
  Filled 2021-08-26 (×7): qty 1
  Filled 2021-08-26: qty 2
  Filled 2021-08-26 (×20): qty 1
  Filled 2021-08-26: qty 2
  Filled 2021-08-26 (×197): qty 1

## 2021-08-26 MED ORDER — RENA-VITE PO TABS
1.0000 | ORAL_TABLET | Freq: Every day | ORAL | Status: DC
  Administered 2021-08-26 – 2022-03-17 (×204): 1 via ORAL
  Filled 2021-08-26 (×196): qty 1

## 2021-08-26 MED ORDER — SODIUM CHLORIDE 0.9 % IV SOLN
1.0000 g | INTRAVENOUS | Status: DC
  Administered 2021-08-26: 1 g via INTRAVENOUS
  Filled 2021-08-26: qty 10

## 2021-08-26 MED ORDER — OXYCODONE HCL 5 MG PO TABS
5.0000 mg | ORAL_TABLET | ORAL | Status: DC | PRN
  Administered 2021-08-26 – 2021-08-27 (×4): 5 mg via ORAL
  Filled 2021-08-26 (×4): qty 1

## 2021-08-26 MED ORDER — SEVELAMER CARBONATE 800 MG PO TABS
1600.0000 mg | ORAL_TABLET | Freq: Three times a day (TID) | ORAL | Status: DC
  Administered 2021-08-27 – 2021-10-22 (×143): 1600 mg via ORAL
  Filled 2021-08-26 (×144): qty 2

## 2021-08-26 MED ORDER — CEFAZOLIN SODIUM-DEXTROSE 1-4 GM/50ML-% IV SOLN: 1.0000 g | INTRAVENOUS | Status: DC

## 2021-08-26 MED ORDER — SODIUM CHLORIDE 0.9% FLUSH
3.0000 mL | Freq: Two times a day (BID) | INTRAVENOUS | Status: DC
  Administered 2021-08-26 – 2021-11-14 (×141): 3 mL via INTRAVENOUS

## 2021-08-26 MED ORDER — ACETAMINOPHEN 325 MG PO TABS
650.0000 mg | ORAL_TABLET | Freq: Four times a day (QID) | ORAL | Status: DC | PRN
  Administered 2021-08-26 – 2021-08-27 (×2): 650 mg via ORAL
  Filled 2021-08-26 (×3): qty 2

## 2021-08-26 MED ORDER — DICLOFENAC SODIUM 1 % EX GEL: 2.0000 g | Freq: Two times a day (BID) | CUTANEOUS | Status: DC | PRN

## 2021-08-26 MED ORDER — DULOXETINE HCL 60 MG PO CPEP
60.0000 mg | ORAL_CAPSULE | Freq: Every day | ORAL | Status: DC
  Administered 2021-08-26 – 2022-02-12 (×169): 60 mg via ORAL
  Filled 2021-08-26 (×170): qty 1

## 2021-08-26 NOTE — ED Triage Notes (Signed)
Pt arrives via EMS. Pt D/C from this hosp Friday, dialysis pt and receiving IV ABT for infection to L hip s/p sx 6 weeks ago. Pt feels like wound is draining more than usual

## 2021-08-26 NOTE — Assessment & Plan Note (Signed)
Encouraged cessation, declines patch

## 2021-08-26 NOTE — Assessment & Plan Note (Addendum)
Clinically stable. 

## 2021-08-26 NOTE — Assessment & Plan Note (Addendum)
Continue with pantoprazole 40 mg daily.

## 2021-08-26 NOTE — Assessment & Plan Note (Signed)
Continue lipitor daily  ?

## 2021-08-26 NOTE — Assessment & Plan Note (Addendum)
Continue blood pressure control with amlodipine, carvedilol and hydralazine.

## 2021-08-26 NOTE — Assessment & Plan Note (Addendum)
Status post surgical intervention by Dr. [name] with multiple left hip debridements.   Left BKA. She has completed a 6-week course of daptomycin on 10/09/2021.    Re-imaging on 12/02/2021 of left knee showed healing fracture with callus.    No left hip restrictions at this time.  Continue fentanyl patch, methocarbamol, and oxycodone (decrease frequency to q 8 hrs prn).  Patient able to tolerate wheelchair for mobility but not for HD.

## 2021-08-26 NOTE — Assessment & Plan Note (Addendum)
A1C of 7.1 in 07/2021 Uncontrolled hyperglycemia.  Continue insulin therapy basal 10 units and pre meal insulin 6 units.    Continue with statin therapy.

## 2021-08-26 NOTE — Assessment & Plan Note (Addendum)
Sp fistula revision and branch ligation.  Hyponatremia, hyperphosphatemia, hyperkalemia.   Continue renal replacement therapy M-W-F.  Continue furosemide 80 mg daily.  Sodium zirconium on non HD days.   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 Continue with EPO   Metabolic bone disease, continue with sevelamer.   Patient has not been able to tolerate chair HD due to left hip pain.

## 2021-08-26 NOTE — Assessment & Plan Note (Addendum)
No history of trauma, but felt like she twisted it Exam difficult to perform draw sign on Knee xray with edema and no acute osseous finding Ice prn  May need further imaging and re-examination tomorrow

## 2021-08-27 ENCOUNTER — Encounter (HOSPITAL_COMMUNITY): Payer: Self-pay | Admitting: Family Medicine

## 2021-08-27 DIAGNOSIS — B9561 Methicillin susceptible Staphylococcus aureus infection as the cause of diseases classified elsewhere: Secondary | ICD-10-CM | POA: Diagnosis not present

## 2021-08-27 DIAGNOSIS — N186 End stage renal disease: Secondary | ICD-10-CM | POA: Diagnosis not present

## 2021-08-27 DIAGNOSIS — Z992 Dependence on renal dialysis: Secondary | ICD-10-CM | POA: Diagnosis not present

## 2021-08-27 DIAGNOSIS — M86252 Subacute osteomyelitis, left femur: Secondary | ICD-10-CM | POA: Diagnosis not present

## 2021-08-27 LAB — BASIC METABOLIC PANEL
Anion gap: 9 (ref 5–15)
BUN: 45 mg/dL — ABNORMAL HIGH (ref 6–20)
CO2: 26 mmol/L (ref 22–32)
Calcium: 8.1 mg/dL — ABNORMAL LOW (ref 8.9–10.3)
Chloride: 96 mmol/L — ABNORMAL LOW (ref 98–111)
Creatinine, Ser: 6.81 mg/dL — ABNORMAL HIGH (ref 0.44–1.00)
GFR, Estimated: 7 mL/min — ABNORMAL LOW (ref >60)
Glucose, Bld: 167 mg/dL — ABNORMAL HIGH (ref 70–99)
Potassium: 4.1 mmol/L (ref 3.5–5.1)
Sodium: 131 mmol/L — ABNORMAL LOW (ref 135–145)

## 2021-08-27 LAB — CBC
HCT: 30.4 % — ABNORMAL LOW (ref 36.0–46.0)
Hemoglobin: 9.3 g/dL — ABNORMAL LOW (ref 12.0–15.0)
MCH: 28.8 pg (ref 26.0–34.0)
MCHC: 30.6 g/dL (ref 30.0–36.0)
MCV: 94.1 fL (ref 80.0–100.0)
Platelets: 277 10*3/uL (ref 150–400)
RBC: 3.23 MIL/uL — ABNORMAL LOW (ref 3.87–5.11)
RDW: 15.9 % — ABNORMAL HIGH (ref 11.5–15.5)
WBC: 10.7 10*3/uL — ABNORMAL HIGH (ref 4.0–10.5)
nRBC: 0 % (ref 0.0–0.2)

## 2021-08-27 LAB — GLUCOSE, CAPILLARY
Glucose-Capillary: 205 mg/dL — ABNORMAL HIGH (ref 70–99)
Glucose-Capillary: 229 mg/dL — ABNORMAL HIGH (ref 70–99)

## 2021-08-27 MED ORDER — CEFAZOLIN SODIUM-DEXTROSE 2-4 GM/100ML-% IV SOLN
2.0000 g | INTRAVENOUS | Status: DC
  Administered 2021-08-28: 2 g via INTRAVENOUS
  Filled 2021-08-27: qty 100

## 2021-08-27 MED ORDER — OXYCODONE HCL 5 MG PO TABS
10.0000 mg | ORAL_TABLET | ORAL | Status: DC | PRN
Start: 2021-08-27 — End: 2021-08-27

## 2021-08-27 MED ORDER — HEPARIN SODIUM (PORCINE) 1000 UNIT/ML DIALYSIS
2000.0000 [IU] | INTRAMUSCULAR | Status: DC | PRN
Start: 2021-08-27 — End: 2021-08-27

## 2021-08-27 MED ORDER — CHLORHEXIDINE GLUCONATE 4 % EX LIQD
60.0000 mL | Freq: Once | CUTANEOUS | Status: AC
  Filled 2021-08-27: qty 60

## 2021-08-27 MED ORDER — POVIDONE-IODINE 10 % EX SWAB
2.0000 | Freq: Once | CUTANEOUS | Status: AC
  Administered 2021-08-28: 2 via TOPICAL

## 2021-08-27 MED ORDER — CEFAZOLIN SODIUM-DEXTROSE 2-4 GM/100ML-% IV SOLN
2.0000 g | INTRAVENOUS | Status: AC
  Administered 2021-08-28: 2 g via INTRAVENOUS
  Filled 2021-08-27: qty 100

## 2021-08-27 MED ORDER — OXYCODONE HCL 5 MG PO TABS
15.0000 mg | ORAL_TABLET | ORAL | Status: DC | PRN
  Administered 2021-08-27 – 2021-08-28 (×3): 15 mg via ORAL
  Filled 2021-08-27 (×4): qty 3

## 2021-08-27 MED ORDER — CHLORHEXIDINE GLUCONATE CLOTH 2 % EX PADS
6.0000 | MEDICATED_PAD | Freq: Every day | CUTANEOUS | Status: DC
  Administered 2021-08-28 – 2021-08-31 (×3): 6 via TOPICAL

## 2021-08-27 NOTE — Consult Note (Signed)
[facility]    Date of Admission:  08/26/2021     Total days of antibiotics 31               Reason for Consult: Left hip infection  Referring Provider: Dr. [place] Primary Care Provider: [facility]   ASSESSMENT:  [name] is a 56 y/o female with recent hospitalization for femoral osteomyelitis, hardware infection, abscess s/p hardware removal and I&D on 08/10/21 with cultures positive for /MSSA on Cefazolin with dialysis for 6 weeks through 09/22/21 now admitted with increased bloody drainage from the surgical site. Concern for continued infection with plans for I&D on 08/28/21. Will continue current dose of Cefazolin. Request cultures to be obtained during surgery if possible to guide antibiotic therapy. At minimum will need to restart 6 week treatment duration with dialysis following last surgery. Remaining medical and supportive care per primary team.   PLAN:  Continue current dose of Cefazolin. I&D with Dr. Lajoyce Corners for 08/28/21.  Remaining medical and supportive care per primary team.    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  Active Problems:   TOBACCO DEPENDENCE   GASTROESOPHAGEAL REFLUX, NO ESOPHAGITIS   Anxiety and depression   Wound infection   ESRD (end stage renal disease) on dialysis (HCC)   Benign essential HTN   Dyslipidemia, goal LDL below 100   Fracture of femoral condyle, left, closed (HCC)   Uncontrolled type 2 diabetes mellitus with hypoglycemia, with long-term current use of insulin (HCC)   History of CVA (cerebrovascular accident)   Right knee pain   Open toe wound of right 5th toe    Iron deficiency anemia, unspecified    alosetron  1 mg Oral BID   amitriptyline  25 mg Oral QHS   amLODipine  5 mg Oral Daily   atorvastatin  40 mg Oral q1800   carvedilol  6.25 mg Oral BID   cyclobenzaprine  10 mg Oral TID   DULoxetine  60 mg Oral Daily   heparin  5,000 Units Subcutaneous Q8H    hydrALAZINE  50 mg Oral TID   insulin aspart  0-6 Units Subcutaneous TID WC   insulin glargine-yfgn  5 Units Subcutaneous QHS   multivitamin  1 tablet Oral QHS   pantoprazole  80 mg Oral Daily   sevelamer carbonate  1,600 mg Oral TID WC   sodium chloride flush  3 mL Intravenous Q12H     HPI: [name] is a 56 y.o. 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  [name] is known to the ID service and was seen during her most recent hospitalization from 08/07/21-08/23/21 with new left femoral condyle fracture and osteomyelitis and hardware infection of the left hip s/p partial resection of the femure, soft tissue and removal of affixes nail with findings of extensive bone/soft tissue infection with purulent abscess. Cultures obtained were positive for MSSA. In November 2021 had Enterobacter cloacae and Bacteroides fragilis. Antibiotics were changed from cefepime/metronidazole to Cefazolin with plan of care for 6 weeks of antimicrobial therapy with dialysis. Prior to discharge she underwent left arm fistulagram with findings of healthy fistula without limiting stenosis.  Presented to the ED on 08/26/21 with bloody drainage from her surgical site that started when she was in dialysis. Had  some drainage with movement but had noted it increased. CT left hip with communuted ununited left intertrochanteric fracture and soft tissue wound overlying the greater trochanter with air extending from the skin surface into the defect in the proximal femoral diaphysis and joint space.     Review of Systems: Review of Systems  Constitutional:  Negative for chills, fever and weight loss.  Respiratory:  Negative for cough, shortness of breath and wheezing.   Cardiovascular:  Negative for chest pain and leg swelling.   Gastrointestinal:  Negative for abdominal pain, constipation, diarrhea, nausea and vomiting.  Skin:  Negative for rash.     Past Medical History:  Diagnosis Date   Anxiety 2002   Chest tightness    Chronic kidney disease    Depression 2001   Diabetes type 1, uncontrolled    at age 60   Diabetic neuropathy (HCC)    Essential hypertension 2015   GERD (gastroesophageal reflux disease)    about age of 56   Headache    Nausea and vomiting in adult    Stroke [facility])    Urinary frequency    Yeast vaginitis     Social History   Tobacco Use   Smoking status: Light Smoker    Packs/day: 0.50    Years: 22.00    Total pack years: 11.00    Types: Cigarettes   Smokeless tobacco: Never  Vaping Use   Vaping Use: Never used  Substance Use Topics   Alcohol use: Yes    Comment: social   Drug use: No    Family History  Problem Relation Age of Onset   Diabetes Mother    Heart attack Mother    Heart disease Mother        before age 68   Hypertension Mother    Hyperlipidemia Mother    Diabetes Father    Heart attack Father    Heart disease Father        before age 57   Diabetes Sister    Hypertension Sister     Allergies  Allergen Reactions   Trazodone Swelling   Latex Rash   Lidocaine Itching    OBJECTIVE: Blood pressure (!) 158/60, pulse 69, temperature 98.2 F (36.8 C), temperature source Oral, resp. rate 18, height 5\' 10"  (1.778 m), weight 97.1 kg, SpO2 99 %.  Physical Exam Constitutional:      General: She is not in acute distress.    Appearance: She is well-developed.  Cardiovascular:     Rate and Rhythm: Normal rate and regular rhythm.     Heart sounds: Normal heart sounds.  Pulmonary:     Effort: Pulmonary effort is normal.     Breath sounds: Normal breath sounds.  Skin:    General: Skin is warm and dry.  Neurological:     Mental Status: She is alert and oriented to person, place, and time.  Psychiatric:        Behavior: Behavior normal.         Thought Content: Thought content normal.        Judgment: Judgment normal.     Lab Results Lab Results  Component Value Date   WBC 10.7 (H) 08/27/2021   HGB 9.3 (L) 08/27/2021   HCT 30.4 (L) 08/27/2021   MCV 94.1 08/27/2021   PLT 277 08/27/2021    Lab Results  Component Value Date   CREATININE 6.81 (H) 08/27/2021   BUN 45 (H) 08/27/2021   NA 131 (L) 08/27/2021  K 4.1 08/27/2021   CL 96 (L) 08/27/2021   CO2 26 08/27/2021    Lab Results  Component Value Date   ALT <5 08/23/2021   AST 13 (L) 08/23/2021   ALKPHOS 162 (H) 08/23/2021   BILITOT 0.7 08/23/2021     Microbiology: No results found for this or any previous visit (from the past 240 hour(s)).   Marcos Eke, NP [facility]  08/27/2021  9:32 AM

## 2021-08-27 NOTE — Procedures (Signed)
   I was present at this dialysis session, have reviewed the session itself and made  appropriate changes Vinson Moselle MD [facility] pager [phone]   08/27/2021, 3:44 PM

## 2021-08-27 NOTE — Progress Notes (Signed)
PROGRESS NOTE    [name]  WUJ:[id number] DOB: [birth year] DOA: 08/26/2021 PCP: [facility]    Brief Narrative:   [name] is a 56 y.o. female with past medical history significant for HTN, HLD, ESRD on HD MWF, history of left BKA 2021, depression/anxiety, history of CVA, tobacco use disorder, type 2 diabetes mellitus, insomnia, chronic pain syndrome, recent mechanical fall with left femur fracture deemed nonoperable by orthopedics and recent superficial abscess left hip s/p I&D 5/28 with partial resection left femur for osteomyelitis with soft tissue debridement who presents to [facility] ED on 6/26 with complaints of bloody drainage from her surgical incision site.  Patient reports some drainage occasionally, worse with any type of movement.  Given the drainage increasing EMS was called to evaluate.  Patient also complains of right knee pain, states twisted right knee a few days prior.  Denies fever/chills, no visual changes, no headache, no chest pain, no palpitations, no shortness of breath, no cough/congestion, no nausea/vomiting/diarrhea, no urinary symptoms.  Currently receiving cefazolin with HD per ID recommendations.  In the ED, temperature 97.7 F, HR 76, RR 17, BP 170/65, SPO2 97% on room air.  WBC 13.1, hemoglobin 8.7, platelets 269.  Sodium 133, potassium 4.4, chloride 93, CO2 25, glucose 191, BUN 41, creatinine 6.41.  Lactic acid 1.2.  CT left hip without contrast with comminuted ununited left intratrochanteric fracture, prior intramedullary nail and interlocking femoral neck screw which had been removed, soft tissue wound overlying greater trochanter with air extending from the skin surface to the defect in the proximal femoral diaphysis into the joint space, appearance concerning for infection, persistent partial osteolysis posterior lateral cortex of the proximal femoral diaphysis, no drainable fluid collection, soft tissue wound overlying lateral aspect proximal  femoral diaphysis with small bubble of air within the soft tissues which may be postsurgical versus secondary to soft tissue infection.  Orthopedics, Dr. Lajoyce Corners was consulted.  Hospital service consulted for further evaluation management for likely need of further operative management/I&D.  Assessment & Plan:   Left femur osteomyelitis with abscess Patient presenting with increased drainage from left hip surgical site.  History of hospital-itis with abscess s/p hardware removal and partial resection of left femur and I&D abscess and soft tissue.  CT left hip on admission shows concern for infection within the soft tissues. --Orthopedics, Dr. Lajoyce Corners and infectious disease following, appreciate assistance --Continue cefazolin with HD --Plan I&D 08/28/2021, n.p.o. after midnight --Oxycodone 15 mg PO q4h PRN moderate pain  Hx Fracture femoral condyle, left, closed History of left femoral hip fracture 07/2021 that was deemed inoperable by orthopedics. --Nonweightbearing left lower extremity  Right knee pain likely secondary to sprain Patient reports twisting her knee a few days prior.  X-ray right knee with soft tissue edema without osseous abnormality. --Pain control, supportive care  ESRD on HD MWF --Nephrology following, appreciate assistance  Type 2 diabetes mellitus Hemoglobin A1c 7.01 Jul 2021.  Home regimen includes Lantus 10 units copiously daily, NovoLog 10 units 3 times daily AC. --Semglee 5u Madrone daily --Novolog SSI for coverage --CBGs qAC/HS  Essential hypertension --Amlodipine 5 mg p.o. daily --Carvedilol 6.25 mg p.o. twice daily --Hydralazine 50 mg p.o. TID  HLD: Atorvastatin 40 mg p.o. daily  Anxiety/depression: --Cymbalta 60 mg p.o. daily --Elavil 25 mg p.o. nightly  GERD: Continue PPI  Iron deficiency anemia Baseline hemoglobin around 9 (8.0 at most recent discharge).  Hemoglobin 8.7 on admission. --Hgb 8.7>9.3, stable --Transfuse for hemoglobin less than 7.0 --Repeat  CBC in a.m.   DVT prophylaxis: heparin injection 5,000 Units Start: 08/26/21 1615    Code Status: Full Code Family Communication: Present at bedside this morning  Disposition Plan:  Level of care: Telemetry Medical Status is: Inpatient Remains inpatient appropriate because: Pending surgical invention planned for tomorrow, IV antibiotics, anticipate discharge home once orthopedics signed off    Consultants:  Orthopedics, Dr. Lajoyce Corners Nephrology Infectious disease  Procedures:  None  Antimicrobials:  Cefazolin   Subjective: Patient seen examined bedside, resting comfortably.  Lying in bed.  Requesting increased pain medication this morning.  No other specific questions or concerns at this time.  States Dr. Due to was in earlier and planned surgical invention tomorrow.  No other questions or concerns at this time.  Denies headache, no dizziness, no fever/chills/night sweats, no nausea/vomiting/diarrhea, no chest pain, no palpitations, no shortness of breath, no abdominal pain, no cough/congestion, no focal weakness, no paresthesias.  No acute events overnight per nursing staff.  Objective: Vitals:   08/26/21 1717 08/26/21 2024 08/27/21 0336 08/27/21 0933  BP: (!) 177/72 (!) 195/70 (!) 158/60 (!) 156/60  Pulse: 78 71 69 72  Resp: 17 18 18 18   Temp: 98.4 F (36.9 C) 98.2 F (36.8 C) 98.2 F (36.8 C) 97.8 F (36.6 C)  TempSrc: Oral Oral Oral   SpO2: 98% 90% 99% 100%  Weight: 97.1 kg     Height: 5\' 10"  (1.778 m)       Intake/Output Summary (Last 24 hours) at 08/27/2021 1101 Last data filed at 08/27/2021 0813 Gross per 24 hour  Intake 340 ml  Output 900 ml  Net -560 ml   Filed Weights   08/26/21 1218 08/26/21 1717  Weight: 96.2 kg 97.1 kg    Examination:  Physical Exam: GEN: NAD, alert and oriented x 3, chronically ill appearance, appears older than stated age HEENT: NCAT, PERRL, EOMI, sclera clear, MMM PULM: CTAB w/o wheezes/crackles, normal respiratory effort, on  room air CV: RRR w/o M/G/R GI: abd soft, NTND, NABS, no R/G/M MSK: Left BKA noted, left lateral thigh with dressing in place, some dark blood-tinged noted distal aspect, no active bleeding noted when dressing taken down with sutures noted in place and wound well approximated, 1+ edema in right lower extremity NEURO: CN II-XII intact, no focal deficits, sensation to light touch intact PSYCH: normal mood/affect Integumentary: Skin as above    Data Reviewed: I have personally reviewed following labs and imaging studies  CBC: Recent Labs  Lab 08/22/21 0322 08/23/21 0334 08/23/21 0907 08/26/21 1225 08/27/21 0512  WBC 9.0 9.2 9.7 13.1* 10.7*  NEUTROABS 4.9 5.6  --  10.2*  --   HGB 7.8* 8.0* 8.0* 8.7* 9.3*  HCT 24.9* 25.4* 25.6* 29.3* 30.4*  MCV 95.0 93.7 93.4 98.0 94.1  PLT 269 242 263 269 277   Basic Metabolic Panel: Recent Labs  Lab 08/20/21 1258 08/21/21 0900 08/22/21 0322 08/23/21 0334 08/23/21 0907 08/26/21 1225 08/27/21 0512  NA 127* 127* 128* 126* 127* 133* 131*  K 4.8 5.2* 4.3 4.4 4.8 4.4 4.1  CL 90* 91* 92* 91* 89* 93* 96*  CO2 28 26 29 26 26 25 26   GLUCOSE 238* 171* 189* 179* 164* 191* 167*  BUN 32* 39* 23* 39* 42* 41* 45*  CREATININE 4.58* 5.12* 3.62* 4.77* 4.86* 6.41* 6.81*  CALCIUM 8.3* 8.7* 8.0* 8.1* 8.4* 8.4* 8.1*  PHOS 3.0 3.8 3.2  --  4.4  --   --    GFR: Estimated Creatinine Clearance: 11.9  mL/min (A) (by C-G formula based on SCr of 6.81 mg/dL (H)). Liver Function Tests: Recent Labs  Lab 08/20/21 1258 08/21/21 0900 08/22/21 0322 08/23/21 0334 08/23/21 0907  AST  --   --   --  13*  --   ALT  --   --   --  <5  --   ALKPHOS  --   --   --  162*  --   BILITOT  --   --   --  0.7  --   PROT  --   --   --  5.7*  --   ALBUMIN 2.1* 2.1* 2.0* 2.1* 2.2*   No results for input(s): "LIPASE", "AMYLASE" in the last 168 hours. No results for input(s): "AMMONIA" in the last 168 hours. Coagulation Profile: No results for input(s): "INR", "PROTIME" in the  last 168 hours. Cardiac Enzymes: No results for input(s): "CKTOTAL", "CKMB", "CKMBINDEX", "TROPONINI" in the last 168 hours. BNP (last 3 results) No results for input(s): "PROBNP" in the last 8760 hours. HbA1C: No results for input(s): "HGBA1C" in the last 72 hours. CBG: Recent Labs  Lab 08/22/21 2338 08/23/21 0405 08/23/21 0728 08/26/21 1731 08/26/21 2020  GLUCAP 191* 174* 161* 126* 226*   Lipid Profile: No results for input(s): "CHOL", "HDL", "LDLCALC", "TRIG", "CHOLHDL", "LDLDIRECT" in the last 72 hours. Thyroid Function Tests: No results for input(s): "TSH", "T4TOTAL", "FREET4", "T3FREE", "THYROIDAB" in the last 72 hours. Anemia Panel: No results for input(s): "VITAMINB12", "FOLATE", "FERRITIN", "TIBC", "IRON", "RETICCTPCT" in the last 72 hours. Sepsis Labs: Recent Labs  Lab 08/26/21 1225  LATICACIDVEN 1.2    No results found for this or any previous visit (from the past 240 hour(s)).       Radiology Studies: DG Foot Complete Right  Result Date: 08/26/2021 CLINICAL DATA:  Wound on the first great toe EXAM: RIGHT FOOT COMPLETE - 3+ VIEW COMPARISON:  None Available. FINDINGS: Flexed position at the MTP joints. No fracture. No osseous erosive change or bony destruction. No soft tissue emphysema IMPRESSION: No acute osseous abnormality Electronically Signed   By: Jasmine Pang M.D.   On: 08/26/2021 16:15   DG Knee 1-2 Views Right  Result Date: 08/26/2021 CLINICAL DATA:  Knee pain EXAM: RIGHT KNEE - 1-2 VIEW COMPARISON:  None Available. FINDINGS: No fracture or malalignment. The joint spaces are patent. There is generalized soft tissue edema. No soft tissue emphysema IMPRESSION: Soft tissue edema without acute osseous abnormality Electronically Signed   By: Jasmine Pang M.D.   On: 08/26/2021 16:13   CT Hip Left Wo Contrast  Result Date: 08/26/2021 CLINICAL DATA:  Prior left hip ORIF with infection and removal of hardware. Status post surgery 6 weeks ago. EXAM: CT OF THE  LEFT HIP WITHOUT CONTRAST TECHNIQUE: Multidetector CT imaging of the left hip was performed according to the standard protocol. Multiplanar CT image reconstructions were also generated. RADIATION DOSE REDUCTION: This exam was performed according to the departmental dose-optimization program which includes automated exposure control, adjustment of the mA and/or kV according to patient size and/or use of iterative reconstruction technique. COMPARISON:  08/06/2021 FINDINGS: Bones/Joint/Cartilage Comminuted ununited left intertrochanteric fracture. Evidence of prior intramedullary nail and interlocking femoral neck screw which have been removed. Soft tissue wound overlying the greater trochanter with air extending from the skin surface into the defect in the proximal femoral diaphysis and into the joint space. Severe surrounding soft tissue edema. Severe osteopenia. Partial osteolysis of the posterolateral cortex of the proximal femoral diaphysis. Soft tissue  wound overlying the lateral aspect of the proximal femoral diaphysis with small bubble of air within the soft tissues which may be postsurgical versus secondary to soft tissue infection. No acute fracture or dislocation. Degenerative disease with disc height loss at L5-S1 with bilateral facet arthropathy and foraminal stenosis. Ligaments Ligaments are suboptimally evaluated by CT. Muscles and Tendons Muscles are normal. No muscle atrophy. No intramuscular fluid collection or hematoma. Soft tissue No fluid collection or hematoma.  No soft tissue mass. IMPRESSION: 1. Comminuted ununited left intertrochanteric fracture. Evidence of prior intramedullary nail and interlocking femoral neck screw which have been removed. 2. Soft tissue wound overlying the greater trochanter with air extending from the skin surface into the defect in the proximal femoral diaphysis and into the joint space which may be postsurgical, but given the surgery was 16 days ago, the overall  appearance is concerning for infection. Persistent partial osteolysis of the posterolateral cortex of the proximal femoral diaphysis. No drainable fluid collection. 3. Soft tissue wound overlying the lateral aspect of the proximal femoral diaphysis with small bubble of air within the soft tissues which may be postsurgical versus secondary to soft tissue infection. Electronically Signed   By: Elige Ko M.D.   On: 08/26/2021 13:47        Scheduled Meds:  alosetron  1 mg Oral BID   amitriptyline  25 mg Oral QHS   amLODipine  5 mg Oral Daily   atorvastatin  40 mg Oral q1800   carvedilol  6.25 mg Oral BID   cyclobenzaprine  10 mg Oral TID   DULoxetine  60 mg Oral Daily   heparin  5,000 Units Subcutaneous Q8H   hydrALAZINE  50 mg Oral TID   insulin aspart  0-6 Units Subcutaneous TID WC   insulin glargine-yfgn  5 Units Subcutaneous QHS   multivitamin  1 tablet Oral QHS   pantoprazole  80 mg Oral Daily   sevelamer carbonate  1,600 mg Oral TID WC   sodium chloride flush  3 mL Intravenous Q12H   Continuous Infusions:  sodium chloride      ceFAZolin (ANCEF) IV     [START ON 08/28/2021]  ceFAZolin (ANCEF) IV       LOS: 1 day    Time spent: 51 minutes spent on chart review, discussion with nursing staff, consultants, updating family and interview/physical exam; more than 50% of that time was spent in counseling and/or coordination of care.    Alvira Philips [place], DO [facility] Available via Epic secure chat 7am-7pm After these hours, please refer to coverage provider listed on [email or web address] 08/27/2021, 11:01 AM

## 2021-08-27 NOTE — Progress Notes (Addendum)
The data on the flow sheet for this patient while in dialysis labeled at 1400 has been entered in error.  This data is actually the information for the 1430 values

## 2021-08-27 NOTE — H&P (View-Only) (Signed)
ORTHOPAEDIC CONSULTATION  REQUESTING PHYSICIAN: [place] Eric J, DO  Chief Complaint: Draining abscess left hip.  HPI: [name] is a 56 y.o. female who presents with draining abscess left hip.  Patient is status post debridement and removal of deep retained hardware for a history of infection left hip for 2 years status post trochanteric nail fixation for intertrochanteric fracture.  Past Medical History:  Diagnosis Date   Anxiety 2002   Chest tightness    Chronic kidney disease    Depression 2001   Diabetes type 1, uncontrolled    at age 72   Diabetic neuropathy ([place])    Essential hypertension 2015   GERD (gastroesophageal reflux disease)    about age of 53   Headache    Nausea and vomiting in adult    Stroke [facility])    Urinary frequency    Yeast vaginitis    Past Surgical History:  Procedure Laterality Date   A/V FISTULAGRAM Left 08/22/2021   Procedure: A/V Fistulagram;  Surgeon: Cherre Robins, MD;  Location: [place] [facility];  Service: Cardiovascular;  Laterality: Left;   ACHILLES TENDON SURGERY Left 03/10/2013   Procedure: LEFT CHOPART AMPUTATION/ LEFT TENDON ACHILLES [place];  Surgeon: Wylene Simmer, MD;  Location: [place];  Service: Orthopedics;  Laterality: Left;   AMPUTATION Left 06/15/2012   Procedure: AMPUTATION DIGIT;  Surgeon: Meredith Pel, MD;  Location: [place];  Service: Orthopedics;  Laterality: Left;  Left great toe revision amputation   AMPUTATION Left 01/12/2020   Procedure: AMPUTATION BELOW KNEE;  Surgeon: Wylene Simmer, MD;  Location: [place];  Service: Orthopedics;  Laterality: Left;   AV FISTULA PLACEMENT Left 01/09/2020   Procedure: LEFT UPPER ARM ARTERIOVENOUS (AV) FISTULA CREATION;  Surgeon: Cherre Robins, MD;  Location: [place];  Service: Vascular;  Laterality: Left;   ENDOMETRIAL ABLATION     HARDWARE REMOVAL Left 08/10/2021   Procedure: LEFT HIP REMOVAL NAIL;  Surgeon: Newt Minion, MD;  Location: [place];  Service: Orthopedics;   Laterality: Left;   I & D EXTREMITY Left 01/19/2020   Procedure: IRRIGATION AND DEBRIDEMENT Left Hip;  Surgeon: Rod Can, MD;  Location: [place];  Service: Orthopedics;  Laterality: Left;   INSERTION OF DIALYSIS CATHETER Right 01/09/2020   Procedure: INSERTION OF DIALYSIS CATHETER;  Surgeon: Cherre Robins, MD;  Location: [place];  Service: Vascular;  Laterality: Right;   INTRAMEDULLARY (IM) NAIL INTERTROCHANTERIC Left 12/29/2019   Procedure: INTRAMEDULLARY (IM) NAIL INTERTROCHANTRIC;  Surgeon: Rod Can, MD;  Location: [facility];  Service: Orthopedics;  Laterality: Left;   IR ANGIOGRAM EXTREMITY LEFT  12/17/2018   IR FEM POP ART ATHERECT INC PTA MOD SED  12/17/2018   IR RADIOLOGIST EVAL & MGMT  11/30/2018   IR RADIOLOGIST EVAL & MGMT  12/09/2018   IR RADIOLOGIST EVAL & MGMT  02/08/2019   IR US GUIDE VASC ACCESS RIGHT  12/17/2018   LAPAROSCOPIC CHOLECYSTECTOMY  01/30/2015   [facility]    Social History   Socioeconomic History   Marital status: Divorced    Spouse name: Not on file   Number of children: 1   Years of education: Not on file   Highest education level: Not on file  Occupational History    Employer: [employer]  Tobacco Use   Smoking status: Light Smoker    Packs/day: 0.50    Years: 22.00    Total pack years: 11.00    Types: Cigarettes   Smokeless tobacco: Never  Vaping Use   Vaping Use: Never used  Substance and Sexual Activity   Alcohol use: Yes    Comment: social   Drug use: No   Sexual activity: Yes    Partners: Male  Other Topics Concern   Not on file  Social History Narrative   Lives at home with her daughter   Right handed   Caffeine: 3 cups daily   Social Determinants of Health   Financial Resource Strain: Not on file  Food Insecurity: Not on file  Transportation Needs: Not on file  Physical Activity: Not on file  Stress: Not on file  Social Connections: Not on file   Family History  Problem Relation Age of Onset   Diabetes Mother     Heart attack Mother    Heart disease Mother        before age 21   Hypertension Mother    Hyperlipidemia Mother    Diabetes Father    Heart attack Father    Heart disease Father        before age 80   Diabetes Sister    Hypertension Sister    - negative except otherwise stated in the family history section Allergies  Allergen Reactions   Trazodone Swelling   Latex Rash   Lidocaine Itching   Prior to Admission medications   Medication Sig Start Date End Date Taking? Authorizing Provider  acetaminophen (TYLENOL) 325 MG tablet Take 2 tablets (650 mg total) by mouth every 6 (six) hours as needed for mild pain (or Fever >/= 101). 08/16/21  Yes Pokhrel, Laxman, MD  alosetron (LOTRONEX) 1 MG tablet Take 1 mg by mouth 2 (two) times daily. 12/19/19  Yes [provider]  amitriptyline (ELAVIL) 25 MG tablet Take 25 mg by mouth at bedtime. 07/02/21  Yes [provider]  atorvastatin (LIPITOR) 40 MG tablet TAKE 1 TABLET (40 MG TOTAL) BY MOUTH DAILY AT 6 PM. 02/22/19  Yes Fulp, Cammie, MD  B Complex-C-Zn-Folic Acid (DIALYVITE 993 WITH ZINC) 0.8 MG TABS Take 1 tablet by mouth daily.   Yes [provider]  Biotin 10 MG CAPS Take 1 capsule by mouth daily. 06/05/21  Yes [provider]  carvedilol (COREG) 6.25 MG tablet Take 1 tablet (6.25 mg total) by mouth 2 (two) times daily. 02/21/20  Yes Love, Ivan Anchors, PA-C  ceFAZolin (ANCEF) IVPB Inject 2 g into the vein every Monday, Wednesday, and Friday with hemodialysis. Indication:  MSSA L-hip infection First Dose: Yes Last Day of Therapy:  09/22/21 Labs - Once weekly:  CBC/D and BMP, Labs - Every other week:  ESR and CRP Method of administration: IV Push Method of administration may be changed at the discretion of home infusion pharmacist based upon assessment of the patient and/or caregiver's ability to self-administer the medication ordered. 08/16/21 09/24/21 Yes Pokhrel, Laxman, MD  Cinnamon 500 MG capsule Take 500 mg by  mouth 2 (two) times daily.   Yes [provider]  cyclobenzaprine (FLEXERIL) 10 MG tablet Take 10 mg by mouth 3 (three) times daily. 08/24/21  Yes [provider]  dexlansoprazole (DEXILANT) 60 MG capsule Take 60 mg by mouth daily.   Yes [provider]  diphenoxylate-atropine (LOMOTIL) 2.5-0.025 MG tablet Take 1 tablet by mouth every 12 (twelve) hours as needed for diarrhea or loose stools.   Yes [provider]  DULoxetine (CYMBALTA) 60 MG capsule Take 60 mg by mouth daily. 07/17/20  Yes [provider]  hydrALAZINE (APRESOLINE) 50 MG tablet  Take 1 tablet (50 mg total) by mouth 3 (three) times daily. 08/23/21  Yes Nita Sells, MD  insulin aspart (NOVOLOG) 100 UNIT/ML injection Inject 6 Units into the skin 3 (three) times daily with meals. Patient taking differently: Inject 10 Units into the skin 3 (three) times daily with meals. 08/05/21  Yes Lorella Nimrod, MD  LANTUS SOLOSTAR 100 UNIT/ML Solostar Pen Inject 10 Units into the skin at bedtime. 07/26/21  Yes [provider]  lidocaine-prilocaine (EMLA) cream APPLY SMALL AMOUNT TO ACCESS SITE (AVF) 1 TO 2 HOURS BEFORE DIALYSIS. COVER WITH OCCLUSIVE DRESSING (SARAN WRAP) 06/24/21  Yes [provider]  promethazine (PHENERGAN) 12.5 MG tablet Take 12.5 mg by mouth 2 (two) times daily as needed for vomiting or nausea. 05/27/21  Yes [provider]  sevelamer carbonate (RENVELA) 800 MG tablet Take 2 tablets (1,600 mg total) by mouth 3 (three) times daily with meals. 02/21/20  Yes Love, Ivan Anchors, PA-C  amLODipine (NORVASC) 5 MG tablet Take 1 tablet (5 mg total) by mouth daily. Patient not taking: Reported on 08/26/2021 02/21/20   Love, Ivan Anchors, PA-C  docusate sodium (COLACE) 100 MG capsule Take 1 capsule (100 mg total) by mouth 2 (two) times daily. Patient not taking: Reported on 08/26/2021 08/16/21   Pokhrel, Corrie Mckusick, MD  insulin aspart (NOVOLOG) 100 UNIT/ML injection Inject 0-5 Units  into the skin at bedtime. Patient not taking: Reported on 08/26/2021 08/05/21   Lorella Nimrod, MD  insulin glargine-yfgn (SEMGLEE) 100 UNIT/ML injection Inject 0.04 mLs (4 Units total) into the skin at bedtime. 08/23/21   Nita Sells, MD  Menthol-Methyl Salicylate (MUSCLE RUB) 10-15 % CREA Apply 1 application topically as needed for muscle pain. 02/02/20   Love, Ivan Anchors, PA-C  naloxone [facility]) nasal spray 4 mg/0.1 mL Place 1 spray into the nose as needed (accidental overdose). 04/27/20   [provider]  nicotine (NICODERM CQ - DOSED IN MG/24 HOURS) 21 mg/24hr patch Place 1 patch (21 mg total) onto the skin daily as needed (nicotine craving). Patient not taking: Reported on 08/26/2021 07/25/21   Emeterio Reeve, DO  ondansetron (ZOFRAN) 4 MG tablet Take 1 tablet (4 mg total) by mouth every 6 (six) hours as needed for nausea. 08/16/21   Pokhrel, Corrie Mckusick, MD  oxyCODONE-acetaminophen (PERCOCET) 7.5-325 MG tablet Take 1 tablet by mouth every 6 (six) hours as needed for moderate pain or severe pain. 08/16/21 08/16/22  Pokhrel, Corrie Mckusick, MD  polyethylene glycol (MIRALAX / GLYCOLAX) 17 g packet Take 17 g by mouth daily as needed for mild constipation. Patient not taking: Reported on 08/26/2021 07/25/21   Emeterio Reeve, DO   DG Foot Complete Right  Result Date: 08/26/2021 CLINICAL DATA:  Wound on the first great toe EXAM: RIGHT FOOT COMPLETE - 3+ VIEW COMPARISON:  None Available. FINDINGS: Flexed position at the MTP joints. No fracture. No osseous erosive change or bony destruction. No soft tissue emphysema IMPRESSION: No acute osseous abnormality Electronically Signed   By: Donavan Foil M.D.   On: 08/26/2021 16:15   DG Knee 1-2 Views Right  Result Date: 08/26/2021 CLINICAL DATA:  Knee pain EXAM: RIGHT KNEE - 1-2 VIEW COMPARISON:  None Available. FINDINGS: No fracture or malalignment. The joint spaces are patent. There is generalized soft tissue edema. No soft tissue emphysema IMPRESSION: Soft  tissue edema without acute osseous abnormality Electronically Signed   By: Donavan Foil M.D.   On: 08/26/2021 16:13   CT Hip Left Wo Contrast  Result Date: 08/26/2021 CLINICAL DATA:  Prior left hip ORIF with infection and removal of hardware. Status post surgery 6 weeks ago. EXAM: CT OF THE LEFT HIP WITHOUT CONTRAST TECHNIQUE: Multidetector CT imaging of the left hip was performed according to the standard protocol. Multiplanar CT image reconstructions were also generated. RADIATION DOSE REDUCTION: This exam was performed according to the departmental dose-optimization program which includes automated exposure control, adjustment of the mA and/or kV according to patient size and/or use of iterative reconstruction technique. COMPARISON:  08/06/2021 FINDINGS: Bones/Joint/Cartilage Comminuted ununited left intertrochanteric fracture. Evidence of prior intramedullary nail and interlocking femoral neck screw which have been removed. Soft tissue wound overlying the greater trochanter with air extending from the skin surface into the defect in the proximal femoral diaphysis and into the joint space. Severe surrounding soft tissue edema. Severe osteopenia. Partial osteolysis of the posterolateral cortex of the proximal femoral diaphysis. Soft tissue wound overlying the lateral aspect of the proximal femoral diaphysis with small bubble of air within the soft tissues which may be postsurgical versus secondary to soft tissue infection. No acute fracture or dislocation. Degenerative disease with disc height loss at L5-S1 with bilateral facet arthropathy and foraminal stenosis. Ligaments Ligaments are suboptimally evaluated by CT. Muscles and Tendons Muscles are normal. No muscle atrophy. No intramuscular fluid collection or hematoma. Soft tissue No fluid collection or hematoma.  No soft tissue mass. IMPRESSION: 1. Comminuted ununited left intertrochanteric fracture. Evidence of prior intramedullary nail and interlocking  femoral neck screw which have been removed. 2. Soft tissue wound overlying the greater trochanter with air extending from the skin surface into the defect in the proximal femoral diaphysis and into the joint space which may be postsurgical, but given the surgery was 16 days ago, the overall appearance is concerning for infection. Persistent partial osteolysis of the posterolateral cortex of the proximal femoral diaphysis. No drainable fluid collection. 3. Soft tissue wound overlying the lateral aspect of the proximal femoral diaphysis with small bubble of air within the soft tissues which may be postsurgical versus secondary to soft tissue infection. Electronically Signed   By: Kathreen Devoid M.D.   On: 08/26/2021 13:47   - pertinent xrays, CT, MRI studies were reviewed and independently interpreted  Positive ROS: All other systems have been reviewed and were otherwise negative with the exception of those mentioned in the HPI and as above.  Physical Exam: General: Alert, no acute distress Psychiatric: Patient is competent for consent with normal mood and affect Lymphatic: No axillary or cervical lymphadenopathy Cardiovascular: No pedal edema Respiratory: No cyanosis, no use of accessory musculature GI: No organomegaly, abdomen is soft and non-tender    Images:  '@ENCIMAGES' @  Labs:  Lab Results  Component Value Date   HGBA1C 7.1 (H) 07/22/2021   HGBA1C 6.2 (H) 12/29/2019   HGBA1C 6.7 (A) 10/29/2018   ESRSEDRATE 86 (H) 08/02/2021   ESRSEDRATE 29 03/09/2020   ESRSEDRATE 22 02/20/2020   CRP 4.9 (H) 08/02/2021   CRP 25.8 (H) 03/09/2020   CRP 0.8 02/20/2020   REPTSTATUS 08/15/2021 FINAL 08/10/2021   GRAMSTAIN  08/10/2021    RARE WBC PRESENT,BOTH PMN AND MONONUCLEAR NO ORGANISMS SEEN    CULT  08/10/2021    RARE STAPHYLOCOCCUS AUREUS CRITICAL RESULT CALLED TO, READ BACK BY AND VERIFIED WITH: RN [name] [id number] FCP NO ANAEROBES ISOLATED Performed at [facility],  [place] [address], [place], [place] [address]    [place] 08/10/2021    Lab Results  Component Value Date   ALBUMIN  2.2 (L) 08/23/2021   ALBUMIN 2.1 (L) 08/23/2021   ALBUMIN 2.0 (L) 08/22/2021        Latest Ref Rng & Units 08/27/2021    5:12 AM 08/26/2021   12:25 PM 08/23/2021    9:07 AM  CBC EXTENDED  WBC 4.0 - 10.5 K/uL 10.7  13.1  9.7   RBC 3.87 - 5.11 MIL/uL 3.23  2.99  2.74   Hemoglobin 12.0 - 15.0 g/dL 9.3  8.7  8.0   HCT 36.0 - 46.0 % 30.4  29.3  25.6   Platelets 150 - 400 K/uL 277  269  263   NEUT# 1.7 - 7.7 K/uL  10.2    Lymph# 0.7 - 4.0 K/uL  1.1      Neurologic: Patient does not have protective sensation bilateral lower extremities.   MUSCULOSKELETAL:   Skin: Examination the skin edges are healthy and viable no cellulitis patient reports purulent drainage currently has serosanguineous drainage with resolving hematoma.  White cell count 10.7 down from 13.1.  Hemoglobin stable at 9.3.  Albumin 2.2.  CT scan shows fluid and air in the soft tissue.  Assessment: Assessment: Soft tissue infection left hip status post surgical debridement and removal of intramedullary nail for chronic left hip infection.  Plan: Plan: We will plan for return to the operating room for debridement of the soft tissue.  Patient may require serial debridements depending on the surgical findings.  Plan for surgery tomorrow Wednesday and possible surgery again on Friday.  Thank you for the consult and the opportunity to see Ms. Evie Lacks, [place] [phone] 7:32 AM

## 2021-08-27 NOTE — Progress Notes (Signed)
Pt receives out-pt HD at [facility] on MWF. Pt arrives at 11:55 for 12:15 chair time. Will assist as needed.   Melven Sartorius Renal Navigator [phone]

## 2021-08-28 ENCOUNTER — Encounter (HOSPITAL_COMMUNITY)
Admission: EM | Disposition: A | Discharge status: Home Health | Payer: Self-pay | Source: Home / Self Care | Attending: Internal Medicine

## 2021-08-28 ENCOUNTER — Inpatient Hospital Stay (HOSPITAL_COMMUNITY): Payer: Medicare Other | Admitting: Anesthesiology

## 2021-08-28 ENCOUNTER — Other Ambulatory Visit: Payer: Self-pay

## 2021-08-28 ENCOUNTER — Encounter (HOSPITAL_COMMUNITY): Payer: Self-pay | Admitting: Family Medicine

## 2021-08-28 DIAGNOSIS — E1022 Type 1 diabetes mellitus with diabetic chronic kidney disease: Secondary | ICD-10-CM

## 2021-08-28 DIAGNOSIS — M86252 Subacute osteomyelitis, left femur: Secondary | ICD-10-CM | POA: Diagnosis not present

## 2021-08-28 DIAGNOSIS — N186 End stage renal disease: Secondary | ICD-10-CM

## 2021-08-28 DIAGNOSIS — T8130XA Disruption of wound, unspecified, initial encounter: Secondary | ICD-10-CM | POA: Diagnosis not present

## 2021-08-28 DIAGNOSIS — T8131XS Disruption of external operation (surgical) wound, not elsewhere classified, sequela: Secondary | ICD-10-CM | POA: Insufficient documentation

## 2021-08-28 DIAGNOSIS — F32A Depression, unspecified: Secondary | ICD-10-CM

## 2021-08-28 DIAGNOSIS — F419 Anxiety disorder, unspecified: Secondary | ICD-10-CM | POA: Diagnosis not present

## 2021-08-28 DIAGNOSIS — T148XXA Other injury of unspecified body region, initial encounter: Secondary | ICD-10-CM | POA: Diagnosis not present

## 2021-08-28 DIAGNOSIS — I120 Hypertensive chronic kidney disease with stage 5 chronic kidney disease or end stage renal disease: Secondary | ICD-10-CM

## 2021-08-28 DIAGNOSIS — L089 Local infection of the skin and subcutaneous tissue, unspecified: Secondary | ICD-10-CM

## 2021-08-28 DIAGNOSIS — I10 Essential (primary) hypertension: Secondary | ICD-10-CM

## 2021-08-28 DIAGNOSIS — Z992 Dependence on renal dialysis: Secondary | ICD-10-CM

## 2021-08-28 HISTORY — PX: I & D EXTREMITY: SHX5045

## 2021-08-28 HISTORY — PX: APPLICATION OF WOUND VAC: SHX5189

## 2021-08-28 LAB — CBC
HCT: 28.8 % — ABNORMAL LOW (ref 36.0–46.0)
Hemoglobin: 9.3 g/dL — ABNORMAL LOW (ref 12.0–15.0)
MCH: 30.3 pg (ref 26.0–34.0)
MCHC: 32.3 g/dL (ref 30.0–36.0)
MCV: 93.8 fL (ref 80.0–100.0)
Platelets: 234 10*3/uL (ref 150–400)
RBC: 3.07 MIL/uL — ABNORMAL LOW (ref 3.87–5.11)
RDW: 16 % — ABNORMAL HIGH (ref 11.5–15.5)
WBC: 8.6 10*3/uL (ref 4.0–10.5)
nRBC: 0 % (ref 0.0–0.2)

## 2021-08-28 LAB — RENAL FUNCTION PANEL
Albumin: 2.1 g/dL — ABNORMAL LOW (ref 3.5–5.0)
Anion gap: 12 (ref 5–15)
BUN: 23 mg/dL — ABNORMAL HIGH (ref 6–20)
CO2: 28 mmol/L (ref 22–32)
Calcium: 7.8 mg/dL — ABNORMAL LOW (ref 8.9–10.3)
Chloride: 92 mmol/L — ABNORMAL LOW (ref 98–111)
Creatinine, Ser: 4.32 mg/dL — ABNORMAL HIGH (ref 0.44–1.00)
GFR, Estimated: 12 mL/min — ABNORMAL LOW (ref >60)
Glucose, Bld: 208 mg/dL — ABNORMAL HIGH (ref 70–99)
Phosphorus: 3.5 mg/dL (ref 2.5–4.6)
Potassium: 3.5 mmol/L (ref 3.5–5.1)
Sodium: 132 mmol/L — ABNORMAL LOW (ref 135–145)

## 2021-08-28 LAB — GLUCOSE, CAPILLARY
Glucose-Capillary: 156 mg/dL — ABNORMAL HIGH (ref 70–99)
Glucose-Capillary: 154 mg/dL — ABNORMAL HIGH (ref 70–99)
Glucose-Capillary: 194 mg/dL — ABNORMAL HIGH (ref 70–99)
Glucose-Capillary: 278 mg/dL — ABNORMAL HIGH (ref 70–99)

## 2021-08-28 LAB — SURGICAL PCR SCREEN
MRSA, PCR: NEGATIVE
Staphylococcus aureus: POSITIVE — AB

## 2021-08-28 SURGERY — IRRIGATION AND DEBRIDEMENT EXTREMITY
Anesthesia: General | Laterality: Left

## 2021-08-28 MED ORDER — METHOCARBAMOL 500 MG PO TABS
500.0000 mg | ORAL_TABLET | Freq: Four times a day (QID) | ORAL | Status: DC | PRN
  Administered 2021-08-30 – 2021-12-28 (×99): 500 mg via ORAL
  Filled 2021-08-28 (×106): qty 1

## 2021-08-28 MED ORDER — DEXAMETHASONE SODIUM PHOSPHATE 10 MG/ML IJ SOLN
INTRAMUSCULAR | Status: DC | PRN
  Administered 2021-08-28: 8 mg via INTRAVENOUS

## 2021-08-28 MED ORDER — DARBEPOETIN ALFA 60 MCG/0.3ML IJ SOSY
60.0000 ug | PREFILLED_SYRINGE | INTRAMUSCULAR | Status: DC
  Administered 2021-08-30: 60 ug via INTRAVENOUS
  Filled 2021-08-28 (×2): qty 0.3

## 2021-08-28 MED ORDER — ACETAMINOPHEN 325 MG PO TABS
325.0000 mg | ORAL_TABLET | Freq: Four times a day (QID) | ORAL | Status: DC | PRN
  Administered 2021-09-23 – 2022-02-23 (×12): 650 mg via ORAL
  Filled 2021-08-28 (×14): qty 2

## 2021-08-28 MED ORDER — ONDANSETRON HCL 4 MG/2ML IJ SOLN
4.0000 mg | Freq: Four times a day (QID) | INTRAMUSCULAR | Status: DC | PRN
  Administered 2021-09-01 – 2021-11-19 (×26): 4 mg via INTRAVENOUS
  Filled 2021-08-28 (×28): qty 2

## 2021-08-28 MED ORDER — CHLORHEXIDINE GLUCONATE CLOTH 2 % EX PADS
6.0000 | MEDICATED_PAD | Freq: Every day | CUTANEOUS | Status: AC
  Administered 2021-08-30 – 2021-09-01 (×2): 6 via TOPICAL

## 2021-08-28 MED ORDER — METOCLOPRAMIDE HCL 5 MG PO TABS: 5.0000 mg | ORAL_TABLET | Freq: Three times a day (TID) | ORAL | Status: DC | PRN

## 2021-08-28 MED ORDER — CHLORHEXIDINE GLUCONATE 0.12 % MT SOLN
OROMUCOSAL | Status: AC
  Administered 2021-08-28: 15 mL via OROMUCOSAL
  Filled 2021-08-28: qty 15

## 2021-08-28 MED ORDER — CHLORHEXIDINE GLUCONATE 0.12 % MT SOLN: 15.0000 mL | Freq: Once | OROMUCOSAL | Status: AC

## 2021-08-28 MED ORDER — BISACODYL 10 MG RE SUPP: 10.0000 mg | Freq: Every day | RECTAL | Status: DC | PRN

## 2021-08-28 MED ORDER — DARBEPOETIN ALFA 60 MCG/0.3ML IJ SOSY: 60.0000 ug | PREFILLED_SYRINGE | INTRAMUSCULAR | Status: DC

## 2021-08-28 MED ORDER — HYDROMORPHONE HCL 1 MG/ML IJ SOLN: 0.5000 mg | INTRAMUSCULAR | Status: DC | PRN

## 2021-08-28 MED ORDER — INSULIN ASPART 100 UNIT/ML IJ SOLN: 0.0000 [IU] | INTRAMUSCULAR | Status: DC | PRN

## 2021-08-28 MED ORDER — ONDANSETRON HCL 4 MG PO TABS
4.0000 mg | ORAL_TABLET | Freq: Four times a day (QID) | ORAL | Status: DC | PRN
  Administered 2021-10-03 – 2021-12-02 (×14): 4 mg via ORAL
  Filled 2021-08-28 (×15): qty 1

## 2021-08-28 MED ORDER — SODIUM CHLORIDE 0.9 % IV SOLN: INTRAVENOUS | Status: DC

## 2021-08-28 MED ORDER — ONDANSETRON HCL 4 MG/2ML IJ SOLN
INTRAMUSCULAR | Status: DC | PRN
  Administered 2021-08-28: 4 mg via INTRAVENOUS

## 2021-08-28 MED ORDER — DOCUSATE SODIUM 100 MG PO CAPS
100.0000 mg | ORAL_CAPSULE | Freq: Two times a day (BID) | ORAL | Status: DC
  Administered 2021-08-28 – 2021-11-07 (×102): 100 mg via ORAL
  Filled 2021-08-28 (×132): qty 1

## 2021-08-28 MED ORDER — MUPIROCIN 2 % EX OINT
1.0000 | TOPICAL_OINTMENT | Freq: Two times a day (BID) | CUTANEOUS | Status: AC
  Administered 2021-08-28 – 2021-09-01 (×9): 1 via NASAL
  Filled 2021-08-28 (×2): qty 22

## 2021-08-28 MED ORDER — POLYETHYLENE GLYCOL 3350 17 G PO PACK
17.0000 g | PACK | Freq: Every day | ORAL | Status: DC | PRN
  Filled 2021-08-28: qty 1

## 2021-08-28 MED ORDER — METHOCARBAMOL 1000 MG/10ML IJ SOLN
500.0000 mg | Freq: Four times a day (QID) | INTRAVENOUS | Status: DC | PRN
  Filled 2021-08-28: qty 5

## 2021-08-28 MED ORDER — MIDAZOLAM HCL 2 MG/2ML IJ SOLN
INTRAMUSCULAR | Status: AC
  Filled 2021-08-28: qty 2

## 2021-08-28 MED ORDER — FENTANYL CITRATE (PF) 250 MCG/5ML IJ SOLN
INTRAMUSCULAR | Status: AC
  Filled 2021-08-28: qty 5

## 2021-08-28 MED ORDER — PROPOFOL 10 MG/ML IV BOLUS
INTRAVENOUS | Status: DC | PRN
  Administered 2021-08-28: 150 mg via INTRAVENOUS

## 2021-08-28 MED ORDER — FENTANYL CITRATE (PF) 100 MCG/2ML IJ SOLN
INTRAMUSCULAR | Status: AC
  Filled 2021-08-28: qty 2

## 2021-08-28 MED ORDER — LIDOCAINE 2% (20 MG/ML) 5 ML SYRINGE
INTRAMUSCULAR | Status: DC | PRN
  Administered 2021-08-28: 60 mg via INTRAVENOUS

## 2021-08-28 MED ORDER — OXYCODONE HCL 5 MG PO TABS
10.0000 mg | ORAL_TABLET | ORAL | Status: DC | PRN
  Administered 2021-08-28: 10 mg via ORAL
  Administered 2021-08-28 – 2021-08-29 (×5): 15 mg via ORAL
  Filled 2021-08-28 (×5): qty 3

## 2021-08-28 MED ORDER — OXYCODONE HCL 5 MG/5ML PO SOLN: 5.0000 mg | Freq: Once | ORAL | Status: AC | PRN

## 2021-08-28 MED ORDER — OXYCODONE HCL 5 MG PO TABS
5.0000 mg | ORAL_TABLET | ORAL | Status: DC | PRN
  Filled 2021-08-28: qty 2

## 2021-08-28 MED ORDER — FENTANYL CITRATE (PF) 250 MCG/5ML IJ SOLN
INTRAMUSCULAR | Status: DC | PRN
  Administered 2021-08-28: 100 ug via INTRAVENOUS
  Administered 2021-08-28: 50 ug via INTRAVENOUS

## 2021-08-28 MED ORDER — FENTANYL CITRATE (PF) 100 MCG/2ML IJ SOLN
25.0000 ug | INTRAMUSCULAR | Status: DC | PRN
  Administered 2021-08-28 (×2): 50 ug via INTRAVENOUS

## 2021-08-28 MED ORDER — MIDAZOLAM HCL 2 MG/2ML IJ SOLN
INTRAMUSCULAR | Status: DC | PRN
  Administered 2021-08-28: 2 mg via INTRAVENOUS

## 2021-08-28 MED ORDER — METOCLOPRAMIDE HCL 5 MG/ML IJ SOLN: 5.0000 mg | Freq: Three times a day (TID) | INTRAMUSCULAR | Status: DC | PRN

## 2021-08-28 MED ORDER — CARVEDILOL 3.125 MG PO TABS
ORAL_TABLET | ORAL | Status: AC
  Administered 2021-08-28: 6.25 mg via ORAL
  Filled 2021-08-28: qty 2

## 2021-08-28 MED ORDER — ORAL CARE MOUTH RINSE: 15.0000 mL | Freq: Once | OROMUCOSAL | Status: AC

## 2021-08-28 MED ORDER — 0.9 % SODIUM CHLORIDE (POUR BTL) OPTIME
TOPICAL | Status: DC | PRN
  Administered 2021-08-28: 1000 mL

## 2021-08-28 MED ORDER — ONDANSETRON HCL 4 MG/2ML IJ SOLN: 4.0000 mg | Freq: Four times a day (QID) | INTRAMUSCULAR | Status: DC | PRN

## 2021-08-28 MED ORDER — OXYCODONE HCL 5 MG PO TABS
ORAL_TABLET | ORAL | Status: AC
  Filled 2021-08-28: qty 1

## 2021-08-28 MED ORDER — OXYCODONE HCL 5 MG PO TABS
5.0000 mg | ORAL_TABLET | Freq: Once | ORAL | Status: AC | PRN
  Administered 2021-08-28: 5 mg via ORAL

## 2021-08-28 SURGICAL SUPPLY — 38 items
BAG COUNTER SPONGE SURGICOUNT (BAG) IMPLANT
BAG SPNG CNTER NS LX DISP (BAG)
BLADE SURG 21 STRL SS (BLADE) ×3 IMPLANT
BNDG COHESIVE 6X5 TAN STRL LF (GAUZE/BANDAGES/DRESSINGS) IMPLANT
BNDG GAUZE ELAST 4 BULKY (GAUZE/BANDAGES/DRESSINGS) ×5 IMPLANT
CANISTER WOUND CARE 500ML ATS (WOUND CARE) ×1 IMPLANT
COVER SURGICAL LIGHT HANDLE (MISCELLANEOUS) ×6 IMPLANT
DRAPE DERMATAC (DRAPES) ×2 IMPLANT
DRAPE INCISE IOBAN 66X45 STRL (DRAPES) ×1 IMPLANT
DRAPE U-SHAPE 47X51 STRL (DRAPES) ×4 IMPLANT
DRESSING VERAFLO CLEANS CC MED (GAUZE/BANDAGES/DRESSINGS) IMPLANT
DRSG ADAPTIC 3X8 NADH LF (GAUZE/BANDAGES/DRESSINGS) ×2 IMPLANT
DRSG VERAFLO CLEANSE CC MED (GAUZE/BANDAGES/DRESSINGS) ×6
DURAPREP 26ML APPLICATOR (WOUND CARE) ×3 IMPLANT
ELECT REM PT RETURN 9FT ADLT (ELECTROSURGICAL) ×3
ELECTRODE REM PT RTRN 9FT ADLT (ELECTROSURGICAL) IMPLANT
GAUZE SPONGE 4X4 12PLY STRL (GAUZE/BANDAGES/DRESSINGS) ×2 IMPLANT
GLOVE BIOGEL PI IND STRL 9 (GLOVE) ×2 IMPLANT
GLOVE BIOGEL PI INDICATOR 9 (GLOVE) ×1
GLOVE SURG ORTHO 9.0 STRL STRW (GLOVE) ×3 IMPLANT
GOWN STRL REUS W/ TWL XL LVL3 (GOWN DISPOSABLE) ×4 IMPLANT
GOWN STRL REUS W/TWL XL LVL3 (GOWN DISPOSABLE) ×6
HANDPIECE INTERPULSE COAX TIP (DISPOSABLE)
KIT BASIN OR (CUSTOM PROCEDURE TRAY) ×3 IMPLANT
KIT TURNOVER KIT B (KITS) ×3 IMPLANT
MANIFOLD NEPTUNE II (INSTRUMENTS) ×3 IMPLANT
NS IRRIG 1000ML POUR BTL (IV SOLUTION) ×3 IMPLANT
PACK ORTHO EXTREMITY (CUSTOM PROCEDURE TRAY) ×3 IMPLANT
PAD ARMBOARD 7.5X6 YLW CONV (MISCELLANEOUS) ×6 IMPLANT
PAD NEG PRESSURE SENSATRAC (MISCELLANEOUS) ×1 IMPLANT
SET HNDPC FAN SPRY TIP SCT (DISPOSABLE) IMPLANT
STOCKINETTE IMPERVIOUS 9X36 MD (GAUZE/BANDAGES/DRESSINGS) IMPLANT
SUT ETHILON 2 0 PSLX (SUTURE) ×5 IMPLANT
SWAB COLLECTION DEVICE MRSA (MISCELLANEOUS) ×2 IMPLANT
SWAB CULTURE ESWAB REG 1ML (MISCELLANEOUS) IMPLANT
TOWEL GREEN STERILE (TOWEL DISPOSABLE) ×3 IMPLANT
TUBE CONNECTING 12X1/4 (SUCTIONS) ×3 IMPLANT
YANKAUER SUCT BULB TIP NO VENT (SUCTIONS) ×3 IMPLANT

## 2021-08-28 NOTE — Op Note (Signed)
08/28/2021  9:50 AM  PATIENT:  [name]    PRE-OPERATIVE DIAGNOSIS:  Wound Dehiscence Left Hip  POST-OPERATIVE DIAGNOSIS:  Same  PROCEDURE:  LEFT HIP DEBRIDEMENT, APPLICATION OF WOUND VAC  SURGEON:  Newt Minion, MD  PHYSICIAN ASSISTANT:None ANESTHESIA:   General  PREOPERATIVE INDICATIONS:  [name] is a  56 y.o. female with a diagnosis of Wound Dehiscence Left Hip who failed conservative measures and elected for surgical management.    The risks benefits and alternatives were discussed with the patient preoperatively including but not limited to the risks of infection, bleeding, nerve injury, cardiopulmonary complications, the need for revision surgery, among others, and the patient was willing to proceed.  OPERATIVE IMPLANTS: Cleanse choice wound VAC sponges x2  @ENCIMAGES @  OPERATIVE FINDINGS: Patient had hematoma with purulent drainage.  The soft tissue was sent for cultures.  There was also ischemic muscle that did not have good color contractility or consistency of the quad muscle was resected.  OPERATIVE PROCEDURE: Patient brought the operating room and underwent a general anesthetic.  After adequate levels anesthesia were obtained patient's left lower extremity was prepped using DuraPrep draped into a sterile field a timeout was called.  The lateral hip wound sutures were removed the wound was opened there was purulence and hematoma this was resected.  There was nonviable muscle that did not have contractility poor consistency and poor color.  Part of the quad muscle and tensor fascia lata and iliotibial band were resected.  Wound margins were clear and the wound was packed with 2 cleanse choice sponges after irrigation with normal saline.  The wound was closed over the wound VAC sponges and connected to suction this had a good suction fit patient was extubated taken the PACU in stable condition.   DISCHARGE PLANNING:  Antibiotic duration: Continue IV  antibiotics new cultures obtained  Weightbearing: Nonweightbearing on the left  Pain medication: Opioid pathway  Dressing care/ Wound VAC: Continue wound VAC  Ambulatory devices: Transfer training  Plan for return to the operating room on Friday for repeat debridement and wound closure.

## 2021-08-28 NOTE — Progress Notes (Signed)
Progress Note   Patient: [name] UJW:[id number] DOB: 05-Jul-1965 DOA: 08/26/2021     2 DOS: the patient was seen and examined on 08/28/2021   Brief hospital course: 56 y.o. female with past medical history significant for HTN, HLD, ESRD on HD MWF, history of left BKA 2021, depression/anxiety, history of CVA, tobacco use disorder, type 2 diabetes mellitus, insomnia, chronic pain syndrome, recent mechanical fall with left femur fracture deemed nonoperable by orthopedics and recent superficial abscess left hip s/p I&D 5/28 with partial resection left femur for osteomyelitis with soft tissue debridement who [facility] to [facility] ED on 6/26 with complaints of bloody drainage from her surgical incision site.  Patient reports some drainage occasionally, worse with any type of movement.  Given the drainage increasing EMS was called to evaluate.  Patient also complains of right knee pain, states twisted right knee a few days prior.  Denies fever/chills, no visual changes, no headache, no chest pain, no palpitations, no shortness of breath, no cough/congestion, no nausea/vomiting/diarrhea, no urinary symptoms.  Currently receiving cefazolin with HD per ID recommendations.   In the ED, temperature 97.7 F, HR 76, RR 17, BP 170/65, SPO2 97% on room air.  WBC 13.1, hemoglobin 8.7, platelets 269.  Sodium 133, potassium 4.4, chloride 93, CO2 25, glucose 191, BUN 41, creatinine 6.41.  Lactic acid 1.2.  CT left hip without contrast with comminuted ununited left intratrochanteric fracture, prior intramedullary nail and interlocking femoral neck screw which had been removed, soft tissue wound overlying greater trochanter with air extending from the skin surface to the defect in the proximal femoral diaphysis into the joint space, appearance concerning for infection, persistent partial osteolysis posterior lateral cortex of the proximal femoral diaphysis, no drainable fluid collection, soft tissue wound overlying lateral  aspect proximal femoral diaphysis with small bubble of air within the soft tissues which may be postsurgical versus secondary to soft tissue infection.  Orthopedics, Dr. [name] was consulted.  Hospital service consulted for further evaluation management for likely need of further operative management/I&D.  Assessment and Plan: Left femur osteomyelitis with abscess Patient presenting with increased drainage from left hip surgical site.  History of hospital-itis with abscess s/p hardware removal and partial resection of left femur and I&D abscess and soft tissue.  CT left hip on admission shows concern for infection within the soft tissues. --Orthopedics, Dr. [name] and infectious disease following, appreciate assistance --Continue cefazolin with HD --Plan I&D 08/28/2021, n.p.o. after midnight --Oxycodone 15 mg PO q4h PRN moderate pain   Hx Fracture femoral condyle, left, closed History of left femoral hip fracture 07/2021 that was deemed inoperable by orthopedics. --Nonweightbearing left lower extremity   Right knee pain likely secondary to sprain Patient reports twisting her knee a few days prior.  X-ray right knee with soft tissue edema without osseous abnormality. --Pain control, supportive care   ESRD on HD MWF --Nephrology following, appreciate assistance   Type 2 diabetes mellitus Hemoglobin A1c 7.01 Jul 2021.  Home regimen includes Lantus 10 units copiously daily, NovoLog 10 units 3 times daily AC. --Semglee 5u Drew daily --Novolog SSI for coverage --CBGs qAC/HS   Essential hypertension --Amlodipine 5 mg p.o. daily --Carvedilol 6.25 mg p.o. twice daily --Hydralazine 50 mg p.o. TID   HLD: Atorvastatin 40 mg p.o. daily   Anxiety/depression: --Cymbalta 60 mg p.o. daily --Elavil 25 mg p.o. nightly   GERD: Continue PPI   Iron deficiency anemia Baseline hemoglobin around 9 (8.0 at most recent discharge).  Hemoglobin 8.7 on admission. --  Hgb 8.7>9.3, stable --Transfuse for hemoglobin  less than 7.0 --Repeat CBC in a.m.      Subjective: Without complaints. Pt was seen after returning from OR this AM  Physical Exam: Vitals:   08/28/21 0945 08/28/21 1000 08/28/21 1015 08/28/21 1045  BP: (!) 148/67 (!) 151/64 (!) 162/62 (!) 132/96  Pulse: 71 69 71 69  Resp: 12 10 10 18   Temp: (!) 97.4 F (36.3 C)   98 F (36.7 C)  TempSrc:    Oral  SpO2: 92% 97% 98% 100%  Weight:      Height:       General exam: Awake, laying in bed, in nad Respiratory system: Normal respiratory effort, no wheezing Cardiovascular system: regular rate, s1, s2 Gastrointestinal system: Soft, nondistended, positive BS Central nervous system: CN2-12 grossly intact, strength intact Extremities: Perfused, no clubbing, s/p L BKA Skin: Normal skin turgor, no notable skin lesions seen Psychiatry: Mood normal // no visual hallucinations   Data Reviewed:  Labs reviewed: Na 132, Cr 4.32, Hgb 9.3   Family Communication: Pt in room, family not at bedside  Disposition: Status is: Inpatient Remains inpatient appropriate because: Severity of illness  Planned Discharge Destination: Home    Author: Marylu Lund, MD 08/28/2021 2:37 PM  For on call review [email or web address].

## 2021-08-28 NOTE — Progress Notes (Addendum)
Received patient in bed, alert and oriented. Informed consent signed and in chart.  Time tx completed:1837  HD treatment completed. Patient tolerated well. Fistula without signs and symptoms of complications. Patient transported back to the room, alert and orient and in no acute distress. Report given to bedside RN.  Total UF removed:1950  Medication given:n/a  Post HD VS:109/81,97.9,15,100%  Post HD weight: 90.2kg

## 2021-08-28 NOTE — TOC Progression Note (Signed)
Transition of Care [facility]) - Progression Note    Patient Details  Name: RAMIA SIDNEY MRN: [id number] Date of Birth: March 20, 1965  Transition of Care [facility]) CM/SW Contact  Tom-Johnson, Renea Ee, RN Phone Number: 08/28/2021, 11:54 AM  Clinical Narrative:     Left hip debridement today with wound vac placement by Dr. [name]. CM will continue to follow with needs.    Barriers to Discharge: Continued Medical Work up  Expected Discharge Plan and Services     Discharge Planning Services: CM Consult   Living arrangements for the past 2 months: Single Family Home                                       Social Determinants of Health (SDOH) Interventions    Readmission Risk Interventions    07/26/2021    3:15 PM  Readmission Risk Prevention Plan  Transportation Screening Complete  PCP or Specialist Appt within 3-5 Days Complete  HRI or Home Care Consult Complete  Palliative Care Screening Not Applicable  Medication Review (RN Care Manager) Referral to Pharmacy

## 2021-08-28 NOTE — Hospital Course (Addendum)
This 56 year old female with PMH significant of hypertension, hyperlipidemia, diabetes mellitus type 2, end-stage renal disease on hemodialysis, status post left BKA for chronic calcaneus osteomyelitis on 01/12/2020, bipolar disorder, left distal femoral fracture after a fall on 07/19/2021 deemed  nonoperative, admission from 08/03/2021-08/23/2021 for left femoral osteomyelitis requiring partial resection of femur and rearrangements of bony fragments with subsequent antibiotic treatment presented to the hospital with left hip bleeding and was found to have subacute osteomyelitis and abscess of left femur.  Orthopedic/ID were consulted and patient was started on broad-spectrum antibiotics.  Patient underwent multiple I&D's by Dr. [name] along with local tissue rearrangement.  Patient was also seen by vascular surgery who did fistula branch ligation.  Patient has completed 6-week course of daptomycin on 10/09/2021.  She has been unable to complete hemodialysis in chair and outpatient hemodialysis unit has been hard to find. She is otherwise medically stable for discharge.

## 2021-08-28 NOTE — Anesthesia Preprocedure Evaluation (Signed)
Anesthesia Evaluation  Patient identified by MRN, date of birth, ID band Patient awake    Reviewed: Allergy & Precautions, H&P , NPO status , Patient's Chart, lab work & pertinent test results  Airway Mallampati: II   Neck ROM: full    Dental   Pulmonary Current Smoker and Patient abstained from smoking.,    breath sounds clear to auscultation       Cardiovascular hypertension,  Rhythm:regular Rate:Normal     Neuro/Psych  Headaches, PSYCHIATRIC DISORDERS Anxiety Depression    GI/Hepatic GERD  ,  Endo/Other  diabetes, Type 1  Renal/GU ESRFRenal disease     Musculoskeletal  (+) Arthritis ,   Abdominal   Peds  Hematology  (+) Blood dyscrasia, anemia ,   Anesthesia Other Findings   Reproductive/Obstetrics                             Anesthesia Physical Anesthesia Plan  ASA: 3  Anesthesia Plan: General   Post-op Pain Management:    Induction: Intravenous  PONV Risk Score and Plan: 2 and Ondansetron, Dexamethasone, Midazolam and Treatment may vary due to age or medical condition  Airway Management Planned: Oral ETT  Additional Equipment:   Intra-op Plan:   Post-operative Plan: Extubation in OR  Informed Consent: I have reviewed the patients History and Physical, chart, labs and discussed the procedure including the risks, benefits and alternatives for the proposed anesthesia with the patient or authorized representative who has indicated his/her understanding and acceptance.     Dental advisory given  Plan Discussed with: CRNA, Anesthesiologist and Surgeon  Anesthesia Plan Comments:         Anesthesia Quick Evaluation

## 2021-08-28 NOTE — Anesthesia Procedure Notes (Signed)
Procedure Name: LMA Insertion Date/Time: 08/28/2021 9:05 AM  Performed by: Minerva Ends, CRNAPre-anesthesia Checklist: Patient identified, Emergency Drugs available, Suction available and Patient being monitored Patient Re-evaluated:Patient Re-evaluated prior to induction Oxygen Delivery Method: Circle system utilized Preoxygenation: Pre-oxygenation with 100% oxygen Induction Type: IV induction Ventilation: Mask ventilation without difficulty LMA: LMA inserted LMA Size: 4.0 Tube type: Oral Number of attempts: 1 Placement Confirmation: positive ETCO2 and breath sounds checked- equal and bilateral Tube secured with: Tape Dental Injury: Teeth and Oropharynx as per pre-operative assessment

## 2021-08-28 NOTE — Transfer of Care (Signed)
Immediate Anesthesia Transfer of Care Note  Patient: [name]  Procedure(s) Performed: LEFT HIP DEBRIDEMENT (Left) APPLICATION OF WOUND VAC  Patient Location: PACU  Anesthesia Type:General  Level of Consciousness: awake, alert  and oriented  Airway & Oxygen Therapy: Patient Spontanous Breathing  Post-op Assessment: Report given to RN and Post -op Vital signs reviewed and stable  Post vital signs: Reviewed and stable  Last Vitals:  Vitals Value Taken Time  BP 148/67 08/28/21 0945  Temp 36.3 C 08/28/21 0945  Pulse 70 08/28/21 0945  Resp 12 08/28/21 0945  SpO2 93 % 08/28/21 0945  Vitals shown include unvalidated device data.  Last Pain:  Vitals:   08/28/21 0836  TempSrc:   PainSc: 8       Patients Stated Pain Goal: 3 (35/46/56 8127)  Complications: No notable events documented.

## 2021-08-28 NOTE — Progress Notes (Signed)
[facility] Progress Note   Subjective: Seen in room. In surprisingly good spirits. Just returned for OR S/P L hip debridement. Wound vac in place. Short HD today to resume MWF schedule. No heparin.      Objective Vitals:   08/28/21 0945 08/28/21 1000 08/28/21 1015 08/28/21 1045  BP: (!) 148/67 (!) 151/64 (!) 162/62 (!) 132/96  Pulse: 71 69 71 69  Resp: 12 10 10 18   Temp: (!) 97.4 F (36.3 C)   98 F (36.7 C)  TempSrc:    Oral  SpO2: 92% 97% 98% 100%  Weight:      Height:        Physical Exam General: Chronically ill appearing obese female in NAD Heart: S1,S2 No M/R/G. SR on monitor.  Lungs: CTAB No WOB.  Abdomen: Obese, NABS, NT Extremities: L BKA in ACE wrap. 1-2+ edema Dialysis Access: AVF+T/B    Additional Objective Labs: Basic Metabolic Panel: Recent Labs  Lab 08/22/21 0322 08/23/21 0334 08/23/21 0907 08/26/21 1225 08/27/21 0512 08/28/21 0349  NA 128*   < > 127* 133* 131* 132*  K 4.3   < > 4.8 4.4 4.1 3.5  CL 92*   < > 89* 93* 96* 92*  CO2 29   < > 26 25 26 28   GLUCOSE 189*   < > 164* 191* 167* 208*  BUN 23*   < > 42* 41* 45* 23*  CREATININE 3.62*   < > 4.86* 6.41* 6.81* 4.32*  CALCIUM 8.0*   < > 8.4* 8.4* 8.1* 7.8*  PHOS 3.2  --  4.4  --   --  3.5   < > = values in this interval not displayed.   Liver Function Tests: Recent Labs  Lab 08/23/21 0334 08/23/21 0907 08/28/21 0349  AST 13*  --   --   ALT <5  --   --   ALKPHOS 162*  --   --   BILITOT 0.7  --   --   PROT 5.7*  --   --   ALBUMIN 2.1* 2.2* 2.1*   No results for input(s): "LIPASE", "AMYLASE" in the last 168 hours. CBC: Recent Labs  Lab 08/22/21 0322 08/23/21 0334 08/23/21 0907 08/26/21 1225 08/27/21 0512 08/28/21 0349  WBC 9.0 9.2 9.7 13.1* 10.7* 8.6  NEUTROABS 4.9 5.6  --  10.2*  --   --   HGB 7.8* 8.0* 8.0* 8.7* 9.3* 9.3*  HCT 24.9* 25.4* 25.6* 29.3* 30.4* 28.8*  MCV 95.0 93.7 93.4 98.0 94.1 93.8  PLT 269 242 263 269 277 234   Blood Culture    Component  Value Date/Time   SDES TISSUE 08/10/2021 0822   SPECREQUEST LEFT HIP WOUND 08/10/2021 0822   CULT  08/10/2021 7628    RARE STAPHYLOCOCCUS AUREUS CRITICAL RESULT CALLED TO, READ BACK BY AND VERIFIED WITH: RN [name] [id number] FCP NO ANAEROBES ISOLATED Performed at [facility], [place] [address] [place], [place] [address]    REPTSTATUS 08/15/2021 FINAL 08/10/2021 4627    Cardiac Enzymes: No results for input(s): "CKTOTAL", "CKMB", "CKMBINDEX", "TROPONINI" in the last 168 hours. CBG: Recent Labs  Lab 08/27/21 1149 08/27/21 2107 08/28/21 0734 08/28/21 0945 08/28/21 1119  GLUCAP 205* 229* 156* 154* 194*   Iron Studies: No results for input(s): "IRON", "TIBC", "TRANSFERRIN", "FERRITIN" in the last 72 hours. @lablastinr3 @ Studies/Results: DG Foot Complete Right  Result Date: 08/26/2021 CLINICAL DATA:  Wound on the first great toe EXAM: RIGHT FOOT COMPLETE - 3+ VIEW COMPARISON:  None Available. FINDINGS: Flexed position at the MTP joints. No fracture. No osseous erosive change or bony destruction. No soft tissue emphysema IMPRESSION: No acute osseous abnormality Electronically Signed   By: Donavan Foil M.D.   On: 08/26/2021 16:15   DG Knee 1-2 Views Right  Result Date: 08/26/2021 CLINICAL DATA:  Knee pain EXAM: RIGHT KNEE - 1-2 VIEW COMPARISON:  None Available. FINDINGS: No fracture or malalignment. The joint spaces are patent. There is generalized soft tissue edema. No soft tissue emphysema IMPRESSION: Soft tissue edema without acute osseous abnormality Electronically Signed   By: Donavan Foil M.D.   On: 08/26/2021 16:13   CT Hip Left Wo Contrast  Result Date: 08/26/2021 CLINICAL DATA:  Prior left hip ORIF with infection and removal of hardware. Status post surgery 6 weeks ago. EXAM: CT OF THE LEFT HIP WITHOUT CONTRAST TECHNIQUE: Multidetector CT imaging of the left hip was performed according to the standard protocol. Multiplanar CT image reconstructions were also  generated. RADIATION DOSE REDUCTION: This exam was performed according to the departmental dose-optimization program which includes automated exposure control, adjustment of the mA and/or kV according to patient size and/or use of iterative reconstruction technique. COMPARISON:  08/06/2021 FINDINGS: Bones/Joint/Cartilage Comminuted ununited left intertrochanteric fracture. Evidence of prior intramedullary nail and interlocking femoral neck screw which have been removed. Soft tissue wound overlying the greater trochanter with air extending from the skin surface into the defect in the proximal femoral diaphysis and into the joint space. Severe surrounding soft tissue edema. Severe osteopenia. Partial osteolysis of the posterolateral cortex of the proximal femoral diaphysis. Soft tissue wound overlying the lateral aspect of the proximal femoral diaphysis with small bubble of air within the soft tissues which may be postsurgical versus secondary to soft tissue infection. No acute fracture or dislocation. Degenerative disease with disc height loss at L5-S1 with bilateral facet arthropathy and foraminal stenosis. Ligaments Ligaments are suboptimally evaluated by CT. Muscles and Tendons Muscles are normal. No muscle atrophy. No intramuscular fluid collection or hematoma. Soft tissue No fluid collection or hematoma.  No soft tissue mass. IMPRESSION: 1. Comminuted ununited left intertrochanteric fracture. Evidence of prior intramedullary nail and interlocking femoral neck screw which have been removed. 2. Soft tissue wound overlying the greater trochanter with air extending from the skin surface into the defect in the proximal femoral diaphysis and into the joint space which may be postsurgical, but given the surgery was 16 days ago, the overall appearance is concerning for infection. Persistent partial osteolysis of the posterolateral cortex of the proximal femoral diaphysis. No drainable fluid collection. 3. Soft tissue  wound overlying the lateral aspect of the proximal femoral diaphysis with small bubble of air within the soft tissues which may be postsurgical versus secondary to soft tissue infection. Electronically Signed   By: Kathreen Devoid M.D.   On: 08/26/2021 13:47   Medications:  sodium chloride     sodium chloride      ceFAZolin (ANCEF) IV     methocarbamol (ROBAXIN) IV      alosetron  1 mg Oral BID   amitriptyline  25 mg Oral QHS   amLODipine  5 mg Oral Daily   atorvastatin  40 mg Oral q1800   carvedilol  6.25 mg Oral BID   Chlorhexidine Gluconate Cloth  6 each Topical Q0600   Chlorhexidine Gluconate Cloth  6 each Topical Daily   cyclobenzaprine  10 mg Oral TID   docusate sodium  100 mg Oral BID   DULoxetine  60 mg Oral Daily   fentaNYL       heparin  5,000 Units Subcutaneous Q8H   hydrALAZINE  50 mg Oral TID   insulin aspart  0-6 Units Subcutaneous TID WC   insulin glargine-yfgn  5 Units Subcutaneous QHS   multivitamin  1 tablet Oral QHS   mupirocin ointment  1 Application Nasal BID   oxyCODONE       pantoprazole  80 mg Oral Daily   sevelamer carbonate  1,600 mg Oral TID WC   sodium chloride flush  3 mL Intravenous Q12H     OP HD: [facility] MWF  4h  400/500  90kg  2/2 bath  P2   -Heparin 4000 units IV TIW  LUA AVF     Assessment/ Plan: L hip infection - sp recent L hip surgery on 08/10/21. Now appears to have persistent infection. To OR today for L hip debridement, wound vac placement.  ESRD - on HD MWF. HD 08/27/2021 off schedule. Short HD today to resume MWF Friday schedule.  HTN/ vol - BP's high, vol overloaded but no resp issues. Net UF with HD 08/27/2021 4 liters. Continue to lower volume as tolerated. Cont home BP meds.  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 Start weekly Aranesp with HD tomorrow.  MBD ckd - CCa in range, PO4 at goal.  Cont binders.  DM2 - per pmd     Sedric Guia H. Yamaris Cummings NP-C 08/28/2021, 11:42 AM  [employer] [phone]

## 2021-08-28 NOTE — Interval H&P Note (Signed)
History and Physical Interval Note:  08/28/2021 6:50 AM  [name]  has presented today for surgery, with the diagnosis of Wound Dehiscence Left Hip.  The various methods of treatment have been discussed with the patient and family. After consideration of risks, benefits and other options for treatment, the patient has consented to  Procedure(s): LEFT HIP DEBRIDEMENT (Left) as a surgical intervention.  The patient's history has been reviewed, patient examined, no change in status, stable for surgery.  I have reviewed the patient's chart and labs.  Questions were answered to the patient's satisfaction.     Newt Minion

## 2021-08-29 ENCOUNTER — Encounter (HOSPITAL_COMMUNITY): Payer: Self-pay | Admitting: Orthopedic Surgery

## 2021-08-29 DIAGNOSIS — I10 Essential (primary) hypertension: Secondary | ICD-10-CM | POA: Diagnosis not present

## 2021-08-29 DIAGNOSIS — M86252 Subacute osteomyelitis, left femur: Secondary | ICD-10-CM | POA: Diagnosis not present

## 2021-08-29 LAB — HEPATITIS C ANTIBODY: HCV Ab: NONREACTIVE

## 2021-08-29 LAB — CBC
HCT: 20.8 % — ABNORMAL LOW (ref 36.0–46.0)
Hemoglobin: 6.5 g/dL — CL (ref 12.0–15.0)
MCH: 30 pg (ref 26.0–34.0)
MCHC: 31.3 g/dL (ref 30.0–36.0)
MCV: 95.9 fL (ref 80.0–100.0)
Platelets: 217 10*3/uL (ref 150–400)
RBC: 2.17 MIL/uL — ABNORMAL LOW (ref 3.87–5.11)
RDW: 16.2 % — ABNORMAL HIGH (ref 11.5–15.5)
WBC: 8 10*3/uL (ref 4.0–10.5)
nRBC: 0 % (ref 0.0–0.2)

## 2021-08-29 LAB — PREPARE RBC (CROSSMATCH)

## 2021-08-29 LAB — HEPATITIS B SURFACE ANTIBODY,QUALITATIVE: Hep B S Ab: NONREACTIVE

## 2021-08-29 LAB — HEMOGLOBIN AND HEMATOCRIT, BLOOD
HCT: 25.6 % — ABNORMAL LOW (ref 36.0–46.0)
Hemoglobin: 8.5 g/dL — ABNORMAL LOW (ref 12.0–15.0)

## 2021-08-29 LAB — GLUCOSE, CAPILLARY
Glucose-Capillary: 424 mg/dL — ABNORMAL HIGH (ref 70–99)
Glucose-Capillary: 380 mg/dL — ABNORMAL HIGH (ref 70–99)
Glucose-Capillary: 195 mg/dL — ABNORMAL HIGH (ref 70–99)
Glucose-Capillary: 219 mg/dL — ABNORMAL HIGH (ref 70–99)
Glucose-Capillary: 232 mg/dL — ABNORMAL HIGH (ref 70–99)

## 2021-08-29 LAB — HEPATITIS B SURFACE ANTIGEN: Hepatitis B Surface Ag: NONREACTIVE

## 2021-08-29 LAB — HEPATITIS B CORE ANTIBODY, TOTAL: Hep B Core Total Ab: NONREACTIVE

## 2021-08-29 MED ORDER — SODIUM CHLORIDE 0.9% IV SOLUTION: Freq: Once | INTRAVENOUS | Status: AC

## 2021-08-29 MED ORDER — OXYCODONE HCL 5 MG PO TABS
5.0000 mg | ORAL_TABLET | ORAL | Status: DC | PRN
  Administered 2021-09-04 – 2021-09-06 (×2): 5 mg via ORAL
  Administered 2021-09-26 – 2021-10-19 (×2): 10 mg via ORAL
  Administered 2021-10-21 – 2021-10-27 (×2): 5 mg via ORAL
  Administered 2021-10-27 – 2021-11-15 (×3): 10 mg via ORAL
  Filled 2021-08-29 (×5): qty 2
  Filled 2021-08-29 (×2): qty 1
  Filled 2021-08-29: qty 2
  Filled 2021-08-29: qty 1
  Filled 2021-08-29 (×6): qty 2

## 2021-08-29 MED ORDER — OXYCODONE HCL 5 MG PO TABS
10.0000 mg | ORAL_TABLET | ORAL | Status: DC | PRN
  Administered 2021-08-29 – 2021-09-06 (×40): 15 mg via ORAL
  Administered 2021-09-06: 10 mg via ORAL
  Administered 2021-09-06 – 2021-09-18 (×46): 15 mg via ORAL
  Administered 2021-09-18: 10 mg via ORAL
  Administered 2021-09-19: 15 mg via ORAL
  Administered 2021-09-19 – 2021-09-20 (×2): 10 mg via ORAL
  Administered 2021-09-20 – 2021-10-07 (×57): 15 mg via ORAL
  Administered 2021-10-07: 10 mg via ORAL
  Administered 2021-10-08 – 2021-10-28 (×73): 15 mg via ORAL
  Administered 2021-10-28: 10 mg via ORAL
  Administered 2021-10-28 – 2021-11-21 (×96): 15 mg via ORAL
  Filled 2021-08-29 (×17): qty 3
  Filled 2021-08-29: qty 2
  Filled 2021-08-29 (×33): qty 3
  Filled 2021-08-29: qty 2
  Filled 2021-08-29 (×48): qty 3
  Filled 2021-08-29: qty 2
  Filled 2021-08-29 (×27): qty 3
  Filled 2021-08-29: qty 2
  Filled 2021-08-29 (×34): qty 3
  Filled 2021-08-29: qty 2
  Filled 2021-08-29 (×163): qty 3

## 2021-08-29 MED ORDER — SODIUM CHLORIDE 0.9 % IV SOLN
10.0000 mg/kg | INTRAVENOUS | Status: DC
  Administered 2021-08-29 – 2021-08-31 (×2): 900 mg via INTRAVENOUS
  Filled 2021-08-29 (×3): qty 18

## 2021-08-29 NOTE — Anesthesia Postprocedure Evaluation (Signed)
Anesthesia Post Note  Patient: [name]  Procedure(s) Performed: LEFT HIP DEBRIDEMENT (Left) APPLICATION OF WOUND VAC     Patient location during evaluation: PACU Anesthesia Type: General Level of consciousness: awake and alert Pain management: pain level controlled Vital Signs Assessment: post-procedure vital signs reviewed and stable Respiratory status: spontaneous breathing, nonlabored ventilation, respiratory function stable and patient connected to nasal cannula oxygen Cardiovascular status: blood pressure returned to baseline and stable Postop Assessment: no apparent nausea or vomiting Anesthetic complications: no   No notable events documented.  Last Vitals:  Vitals:   08/28/21 1941 08/28/21 2154  BP: 104/76 (!) 126/51  Pulse: 90 82  Resp: 18   Temp: 37.3 C   SpO2: 99%     Last Pain:  Vitals:   08/29/21 0641  TempSrc:   PainSc: 10-Worst pain ever                 Daine Croker S

## 2021-08-29 NOTE — Progress Notes (Signed)
Patient ID: [name], female   DOB: Jun 13, 1965, 56 y.o.   MRN: [id number] Patient is postoperative day 1 debridement left hip.  There was purulent material and a large hematoma.  Tissue was sent for cultures.  Patient has filled up 3 wound VAC canisters.  Plan to return to the operating room tomorrow.  Discussed with the patient that once we have a healthy granulating wound bed we will proceed with final closure.  Cultures pending.  CBC pending.

## 2021-08-29 NOTE — Inpatient Diabetes Management (Signed)
Inpatient Diabetes Program Recommendations  AACE/ADA: New Consensus Statement on Inpatient Glycemic Control (2015)  Target Ranges:  Prepandial:   less than 140 mg/dL      Peak postprandial:   less than 180 mg/dL (1-2 hours)      Critically ill patients:  140 - 180 mg/dL   Lab Results  Component Value Date   GLUCAP 380 (H) 08/29/2021   HGBA1C 7.1 (H) 07/22/2021    Review of Glycemic Control  Latest Reference Range & Units 08/28/21 07:34 08/28/21 09:45 08/28/21 11:19 08/28/21 19:40 08/29/21 07:27 08/29/21 07:35  Glucose-Capillary 70 - 99 mg/dL 156 (H) 154 (H) 194 (H) 278 (H) 424 (H) 380 (H)   Diabetes history: DM 2 Outpatient Diabetes medications: Lantus 10 units qhs, Novolog 10 units tid Current orders for Inpatient glycemic control:  Semglee 5 units qhs Novolog 0-6 units tid  Inpatient Diabetes Program Recommendations:    Pt received Decadron 8 mg yesterday -  Increase Semglee to home dose 10 units -  Add Novolog 2 units tid meal coverage if eating >50% of meals  Thanks,  Tama Headings RN, MSN, BC-ADM Inpatient Diabetes Coordinator Team Pager [phone] (8a-5p)

## 2021-08-29 NOTE — Care Management Important Message (Signed)
Important Message  Patient Details  Name: [name] MRN: [id number] Date of Birth: 1966/01/08   Medicare Important Message Given:  Yes     Orbie Pyo 08/29/2021, 2:40 PM

## 2021-08-29 NOTE — Progress Notes (Signed)
Pharmacy Antibiotic Note  [name] is a 56 y.o. female for which pharmacy has been consulted for cefazolin dosing for  wound infection .  Patient with a history of HTN, HLD, ESRD on HD MWF, lt BKA, depression/anxiety, stroke, tobacco use, T2DM, insomnia, chronic pain. Pt w/ recent drainage of superficial abscess of lt hip on 5/28. 6/7 admission for partial resection of lt femur 2/2 osteomyelitis. Patient presenting with bloody drainage.  On cefazolin and metronidazole 6/11=>09/22/21 per ID.  WBC 13.1; LA 12; T 97.7 F  Plan:  Continue cefazolin 2g w/ HD MWF as previously initiated Trend WBC, Fever, Renal function, & Clinical course F/u cultures, clinical course, WBC, fever F/u ID recommendations F/u Nephrology plan  Height: 5\' 11"  (180.3 cm) Weight: 92.4 kg (203 lb 11.3 oz) IBW/kg (Calculated) : 70.8  Temp (24hrs), Avg:98.2 F (36.8 C), Min:97.4 F (36.3 C), Max:99.2 F (37.3 C)  Recent Labs  Lab 08/23/21 0334 08/23/21 0907 08/26/21 1225 08/27/21 0512 08/28/21 0349  WBC 9.2 9.7 13.1* 10.7* 8.6  CREATININE 4.77* 4.86* 6.41* 6.81* 4.32*  LATICACIDVEN  --   --  1.2  --   --      Estimated Creatinine Clearance: 18.7 mL/min (A) (by C-G formula based on SCr of 4.32 mg/dL (H)).    Allergies  Allergen Reactions   Trazodone Swelling   Latex Rash   Lidocaine Itching   Antimicrobials this admission: Cefepime x in ED 6/26 Cefazolin 6/13>> (7/23)  Microbiology results: Pending for this admit  6/10 L-hip tissue cx >> MSSA, no anaerobes seen.   [name] A. Levada Dy, PharmD, BCPS, FNKF Clinical Pharmacist [place] Please utilize Amion for appropriate phone number to reach the unit pharmacist ([place])

## 2021-08-29 NOTE — Progress Notes (Signed)
Pharmacy Antibiotic Note  [name] is a 56 y.o. 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 Prior intra-op cultures in May grew MSSA, now with intra-op cultures this admission growing Enterococcus faecium - awaiting sensitivities. Pharmacy has been consulted for Daptomycin dosing.  Noted pt is ESRD and received serial HD 6/28-6/29 with plans to get back on schedule 6/30. Will dose q48h through the weekend and then plan to adjust to dosing on dialysis days if continued based on sensitivities.   Plan: - Start Daptomycin 900 mg (10 mg/kg TBW) IV every 48 hours - If continued - will consider transition to dosing on HD days soon - Will check CK in the AM but expect some elevation related to recent surgeries - Will continue to follow HD schedule/duration, culture results, LOT, and antibiotic de-escalation plans   Height: 5\' 11"  (180.3 cm) Weight: 92.4 kg (203 lb 11.3 oz) IBW/kg (Calculated) : 70.8  Temp (24hrs), Avg:98.4 F (36.9 C), Min:97.6 F (36.4 C), Max:99.2 F (37.3 C)  Recent Labs  Lab 08/23/21 0334 08/23/21 0907 08/26/21 1225 08/27/21 0512 08/28/21 0349 08/29/21 0802  WBC 9.2 9.7 13.1* 10.7* 8.6 8.0  CREATININE 4.77* 4.86* 6.41* 6.81* 4.32*  --   LATICACIDVEN  --   --  1.2  --   --   --     Estimated Creatinine Clearance: 18.7 mL/min (A) (by C-G formula based on SCr of 4.32 mg/dL (H)).    Allergies  Allergen Reactions   Trazodone Swelling   Latex Rash   Lidocaine Itching    Thank you for allowing pharmacy to be a part of this patient's care.  Alycia Rossetti, PharmD, BCPS Infectious Diseases Clinical Pharmacist 08/29/2021 4:25 PM   **Pharmacist phone directory can now be found on [email or web address] ([email or web address]).  Listed under [place].

## 2021-08-29 NOTE — Progress Notes (Signed)
Date and time results received: 08/29/21 0845   Test: hemoglobin 6.5   Name of Provider Notified: Dr Wyline Copas  Orders Received?   blood transfusion ordered

## 2021-08-29 NOTE — Progress Notes (Signed)
Progress Note   Patient: [name] FHQ:[id number] DOB: 03/16/1965 DOA: 08/26/2021     3 DOS: the patient was seen and examined on 08/29/2021   Brief hospital course: 56 y.o. female with past medical history significant for HTN, HLD, ESRD on HD MWF, history of left BKA 2021, depression/anxiety, history of CVA, tobacco use disorder, type 2 diabetes mellitus, insomnia, chronic pain syndrome, recent mechanical fall with left femur fracture deemed nonoperable by orthopedics and recent superficial abscess left hip s/p I&D 5/28 with partial resection left femur for osteomyelitis with soft tissue debridement who presents to [facility] ED on 6/26 with complaints of bloody drainage from her surgical incision site.  Patient reports some drainage occasionally, worse with any type of movement.  Given the drainage increasing EMS was called to evaluate.  Patient also complains of right knee pain, states twisted right knee a few days prior.  Denies fever/chills, no visual changes, no headache, no chest pain, no palpitations, no shortness of breath, no cough/congestion, no nausea/vomiting/diarrhea, no urinary symptoms.  Currently receiving cefazolin with HD per ID recommendations.   In the ED, temperature 97.7 F, HR 76, RR 17, BP 170/65, SPO2 97% on room air.  WBC 13.1, hemoglobin 8.7, platelets 269.  Sodium 133, potassium 4.4, chloride 93, CO2 25, glucose 191, BUN 41, creatinine 6.41.  Lactic acid 1.2.  CT left hip without contrast with comminuted ununited left intratrochanteric fracture, prior intramedullary nail and interlocking femoral neck screw which had been removed, soft tissue wound overlying greater trochanter with air extending from the skin surface to the defect in the proximal femoral diaphysis into the joint space, appearance concerning for infection, persistent partial osteolysis posterior lateral cortex of the proximal femoral diaphysis, no drainable fluid collection, soft tissue wound overlying lateral  aspect proximal femoral diaphysis with small bubble of air within the soft tissues which may be postsurgical versus secondary to soft tissue infection.  Orthopedics, Dr. [name] was consulted.  Hospital service consulted for further evaluation management for likely need of further operative management/I&D.  Assessment and Plan: Left femur osteomyelitis with abscess Patient presenting with increased drainage from left hip surgical site.  History of hospital-itis with abscess s/p hardware removal and partial resection of left femur and I&D abscess and soft tissue.  CT left hip on admission shows concern for infection within the soft tissues. --Orthopedics, Dr. [name] and infectious disease following, appreciate assistance --Continue cefazolin with HD --S/p I/d 6/28 -Cont analgesia as needed   Hx Fracture femoral condyle, left, closed History of left femoral hip fracture 07/2021 that was deemed inoperable by orthopedics. --Nonweightbearing left lower extremity   Right knee pain likely secondary to sprain Patient reports twisting her knee a few days prior.  X-ray right knee with soft tissue edema without osseous abnormality. --Pain control, supportive care   ESRD on HD MWF --Nephrology following -planned for HD 6/30   Type 2 diabetes mellitus Hemoglobin A1c 7.01 Jul 2021.  Home regimen includes Lantus 10 units copiously daily, NovoLog 10 units 3 times daily AC. --Semglee increased to 10 units -Will add novolog 2 units with meals --Novolog SSI for coverage --CBGs qAC/HS   Essential hypertension --Amlodipine 5 mg p.o. daily --Carvedilol 6.25 mg p.o. twice daily --Hydralazine 50 mg p.o. TID   HLD: Atorvastatin 40 mg p.o. daily   Anxiety/depression: --Cymbalta 60 mg p.o. daily --Elavil 25 mg p.o. nightly   GERD: Continue PPI   Iron deficiency anemia with blood loss anemia Baseline hemoglobin around 9 (8.0 at most  recent discharge).  Hemoglobin 8.7 on admission. --Transfuse for hemoglobin  less than 7.0 -hgb down to 6.8, 1 unit PRBC ordered --Repeat CBC in a.m.      Subjective:  Complaining of continued L hip pain, asking for increased pain regimen  Physical Exam: Vitals:   08/29/21 0829 08/29/21 1236 08/29/21 1300 08/29/21 1458  BP: (!) 124/52 (!) 119/49 (!) 122/45 (!) 131/57  Pulse: 74 68 70 70  Resp:   (!) 22   Temp: 97.6 F (36.4 C) 98.4 F (36.9 C) 99.1 F (37.3 C) 97.8 F (36.6 C)  TempSrc: Oral Oral Oral Oral  SpO2: 98% 98%    Weight:      Height:       General exam: Conversant, in no acute distress Respiratory system: normal chest rise, clear, no audible wheezing Cardiovascular system: regular rhythm, s1-s2 Gastrointestinal system: Nondistended, nontender, pos BS Central nervous system: No seizures, no tremors Extremities: No cyanosis, no joint deformities Skin: No rashes, no pallor Psychiatry: Affect normal // no auditory hallucinations   Data Reviewed:  There are no new results to review at this time.  Family Communication: Pt in room, family not at bedside  Disposition: Status is: Inpatient Remains inpatient appropriate because: Severity of illness  Planned Discharge Destination: Home    Author: Marylu Lund, MD 08/29/2021 3:15 PM  For on call review [email or web address].

## 2021-08-29 NOTE — Progress Notes (Signed)
[facility] Progress Note   Subjective: Seen in room with lab at bedside doing T & C. HGB down to 6.5 this AM. Transfuse today, will need HD 1st shift tomorrow. Still above OP EDW after 5900 cc removed in HD in past 2 days.  Pleasant, making jokes. No C/Os.   Objective Vitals:   08/28/21 1800 08/28/21 1941 08/28/21 2154 08/29/21 0829  BP: (!) 93/59 104/76 (!) 126/51 (!) 124/52  Pulse: 96 90 82 74  Resp: (!) 23 18    Temp:  99.2 F (37.3 C)  97.6 F (36.4 C)  TempSrc:  Oral  Oral  SpO2: 96% 99%  98%  Weight:      Height:       Physical Exam General: Chronically ill appearing obese female in NAD Heart: S1,S2 No M/R/G. SR on monitor.  Lungs: CTAB No WOB.  Abdomen: Obese, NABS, NT Extremities: L BKA in ACE wrap. 1-2+ edema. Wound vac in place with copious drainage requiring frequent emptying.  Dialysis Access: AVF+T/B     Additional Objective Labs: Basic Metabolic Panel: Recent Labs  Lab 08/23/21 0907 08/26/21 1225 08/27/21 0512 08/28/21 0349  NA 127* 133* 131* 132*  K 4.8 4.4 4.1 3.5  CL 89* 93* 96* 92*  CO2 26 25 26 28   GLUCOSE 164* 191* 167* 208*  BUN 42* 41* 45* 23*  CREATININE 4.86* 6.41* 6.81* 4.32*  CALCIUM 8.4* 8.4* 8.1* 7.8*  PHOS 4.4  --   --  3.5   Liver Function Tests: Recent Labs  Lab 08/23/21 0334 08/23/21 0907 08/28/21 0349  AST 13*  --   --   ALT <5  --   --   ALKPHOS 162*  --   --   BILITOT 0.7  --   --   PROT 5.7*  --   --   ALBUMIN 2.1* 2.2* 2.1*   No results for input(s): "LIPASE", "AMYLASE" in the last 168 hours. CBC: Recent Labs  Lab 08/23/21 0334 08/23/21 0907 08/26/21 1225 08/27/21 0512 08/28/21 0349 08/29/21 0802  WBC 9.2 9.7 13.1* 10.7* 8.6 8.0  NEUTROABS 5.6  --  10.2*  --   --   --   HGB 8.0* 8.0* 8.7* 9.3* 9.3* 6.5*  HCT 25.4* 25.6* 29.3* 30.4* 28.8* 20.8*  MCV 93.7 93.4 98.0 94.1 93.8 95.9  PLT 242 263 269 277 234 217   Blood Culture    Component Value Date/Time   SDES TISSUE 08/28/2021 0917    SPECREQUEST LEFT THIGH TISSUE 08/28/2021 0917   CULT  08/28/2021 0917    CULTURE REINCUBATED FOR BETTER GROWTH Performed at [facility] [address] [place], [place] [address]    REPTSTATUS PENDING 08/28/2021 6712    Cardiac Enzymes: No results for input(s): "CKTOTAL", "CKMB", "CKMBINDEX", "TROPONINI" in the last 168 hours. CBG: Recent Labs  Lab 08/28/21 0945 08/28/21 1119 08/28/21 1940 08/29/21 0727 08/29/21 0735  GLUCAP 154* 194* 278* 424* 380*   Iron Studies: No results for input(s): "IRON", "TIBC", "TRANSFERRIN", "FERRITIN" in the last 72 hours. @lablastinr3 @ Studies/Results: No results found. Medications:  sodium chloride     sodium chloride Stopped (08/29/21 0139)    ceFAZolin (ANCEF) IV Stopped (08/28/21 2342)   methocarbamol (ROBAXIN) IV      sodium chloride   Intravenous Once   alosetron  1 mg Oral BID   amitriptyline  25 mg Oral QHS   amLODipine  5 mg Oral Daily   atorvastatin  40 mg Oral q1800  carvedilol  6.25 mg Oral BID   Chlorhexidine Gluconate Cloth  6 each Topical Q0600   Chlorhexidine Gluconate Cloth  6 each Topical Daily   cyclobenzaprine  10 mg Oral TID   [START ON 08/30/2021] darbepoetin (ARANESP) injection - DIALYSIS  60 mcg Intravenous Q Fri-HD   docusate sodium  100 mg Oral BID   DULoxetine  60 mg Oral Daily   heparin  5,000 Units Subcutaneous Q8H   hydrALAZINE  50 mg Oral TID   insulin aspart  0-6 Units Subcutaneous TID WC   insulin glargine-yfgn  5 Units Subcutaneous QHS   multivitamin  1 tablet Oral QHS   mupirocin ointment  1 Application Nasal BID   pantoprazole  80 mg Oral Daily   sevelamer carbonate  1,600 mg Oral TID WC   sodium chloride flush  3 mL Intravenous Q12H     OP HD: [facility] MWF  4h  400/500  90kg  2/2 bath  P2   -Heparin 4000 units IV TIW  LUA AVF     Assessment/ Plan: L hip infection - sp recent L hip surgery on 08/10/21. Now appears to have persistent infection. Went to OR 08/28/2021 for L hip debridement,  wound vac placement.  ESRD - on HD MWF. Serial HD 06/28-06/29/2023 HD 08/30/2021 on schedule.  Hold HEPARIN. HTN/ vol - BP high on admission, now on soft side. Has had serial HD past 2 days with 5900 cc removed. Still very much above OP EDW IF weights are accurate. Cont home BP meds. May need to adjust if lower BPs is a true pattern.   Anemia esrd - HGB 6.5 this AM. Copious drainage from wound vac. Has had 2800cc out in past 24 hours. Did NOT get ESA with HD, order changed to align with previous schedule last week. ESA due tomorrow, will increase dose.   MBD ckd - CCa in range, PO4 at goal.  Cont binders.  DM2 - per pmd     Sharunda Salmon H. Tanis Hensarling NP-C 08/29/2021, 10:56 AM  [employer] [phone]

## 2021-08-29 NOTE — Evaluation (Signed)
Physical Therapy Evaluation Patient Details Name: [name] MRN: [id number] DOB: [birth year] Today's Date: 08/29/2021  History of Present Illness  56 y.o. female presented to ED 6/26 from dialysis with increased bloody drainage from L hip wound. 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 s/p hardware removal of left hip with partial resection of tuft tissue and femure that were nonviable on 08/10/21 Discharged home 6/23. underwent L hip debridement and wound vac placement 6/28 PMH: hypertension, hyperlipidemia, ESRD on HD MWF, history of left BKA in 2021, depression/anxiety, stroke, tobacco use, T2DM,  insomnia, chronic pain syndrome,  Clinical Impression  PTA pt living with daughter in mobile home with ramped entrance. Pt reports during d/c home she was independent in transfers to her wheelchair and able to mobilize with wheelchair independently. Pt only agreeable to bed level Evaluation reporting Dr [name] does not want her moving until after her wound is closed up tomorrow. Pt exhibits strength and ROM in R LE commensurate with prior hospitalization. PT recommends SNF level rehab, however pt will likely refuse. PT will continue to follow acutely.      Recommendations for follow up therapy are one component of a multi-disciplinary discharge planning process, led by the attending physician.  Recommendations may be updated based on patient status, additional functional criteria and insurance authorization.  Follow Up Recommendations Skilled nursing-short term rehab (<3 hours/day) Can patient physically be transported by private vehicle: Yes    Assistance Recommended at Discharge Frequent or constant Supervision/Assistance  Patient can return home with the following  A lot of help with walking and/or transfers;A lot of help with bathing/dressing/bathroom;Assistance with cooking/housework;Direct supervision/assist for medications management;Direct supervision/assist  for financial management;Assist for transportation;Help with stairs or ramp for entrance    Equipment Recommendations None recommended by PT  Recommendations for Other Services  OT consult    Functional Status Assessment Patient has had a recent decline in their functional status and demonstrates the ability to make significant improvements in function in a reasonable and predictable amount of time.     Precautions / Restrictions Precautions Precautions: Fall;Other (comment) Precaution Comments: L BKA (baseline), increased drainage from LLE wound Restrictions Weight Bearing Restrictions: Yes LLE Weight Bearing: Non weight bearing Other Position/Activity Restrictions: pt also with chronic L BKA/infection      Mobility  Bed Mobility               General bed mobility comments: refuses bed mobility as refuses, stating Dr [name] does not want any mobility until after surgery on Friday    Transfers                        Ambulation/Gait               General Gait Details: non ambulatory at baseline        Pertinent Vitals/Pain Pain Assessment Pain Assessment: 0-10 Pain Score: 5  Breathing: normal Negative Vocalization: none Facial Expression: smiling or inexpressive Body Language: relaxed Consolability: no need to console PAINAD Score: 0 Pain Location: L hip at drain site Pain Descriptors / Indicators: Discomfort, Sore Pain Intervention(s): Limited activity within patient's tolerance, Monitored during session, Repositioned    Home Living Family/patient expects to be discharged to:: Private residence Living Arrangements: Children Available Help at Discharge: Family;Available PRN/intermittently Type of Home: Mobile home Home Access: Ramped entrance       Home Layout: One level Home Equipment: Wheelchair - manual;BSC/3in1;Rollator (4 wheels)  Prior Function Prior Level of Function : Needs assist             Mobility Comments:  independent with squat pivot transfers to wheelchair, mod I with wheelchair mobility ADLs Comments: daughter provides set up assistance for bathing, indpendent with transfers to [facility] and self dressing, iADLs provided by family     Hand Dominance   Dominant Hand: Right    Extremity/Trunk Assessment   Upper Extremity Assessment Upper Extremity Assessment: Defer to OT evaluation    Lower Extremity Assessment Lower Extremity Assessment: RLE deficits/detail RLE Deficits / Details: hip, knee and ankle ROM WFL, hip flex 4/5, knee flex 4/5, knee ext 3+/5, ankle dorsi/plantarflex 4/5 LLE Deficits / Details: s/p BKA (2021). tendency to rest in external rotation and adduction, unable to flex/extend L knee due to fx    Cervical / Trunk Assessment Cervical / Trunk Assessment: Normal  Communication   Communication: No difficulties  Cognition Arousal/Alertness: Awake/alert Behavior During Therapy: WFL for tasks assessed/performed Overall Cognitive Status: Within Functional Limits for tasks assessed                                 General Comments: A&Ox4, requires encouragement to participate, appears to have poor understanding of need to participate to regain independence        General Comments  VSS on RA    Exercises Total Joint Exercises Ankle Circles/Pumps: Right, Strengthening, AROM, Both, 5 reps, 10 reps (with manual resistance) Quad Sets: Strengthening, Right, 10 reps Gluteal Sets: Strengthening, Both, 10 reps Heel Slides: Strengthening, Right, 10 reps (with manual resistance) Hip ABduction/ADduction: AAROM, Strengthening, Right, 10 reps Straight Leg Raises: AAROM, Strengthening, Right, 10 reps Bridges:  (minimal amplitude, unable to clear surface of bed)   Assessment/Plan    PT Assessment Patient needs continued PT services  PT Problem List Decreased strength;Pain;Decreased activity tolerance;Decreased balance;Decreased mobility;Decreased knowledge of  precautions;Decreased safety awareness       PT Treatment Interventions DME instruction;Therapeutic exercise;Balance training;Wheelchair mobility training;Neuromuscular re-education;Functional mobility training;Therapeutic activities;Patient/family education;Modalities;Manual techniques    PT Goals (Current goals can be found in the Care Plan section)  Acute Rehab PT Goals Patient Stated Goal: less pain with movement PT Goal Formulation: With patient Time For Goal Achievement: 09/10/21 Potential to Achieve Goals: Fair    Frequency Min 3X/week        AM-PAC PT "6 Clicks" Mobility  Outcome Measure Help needed turning from your back to your side while in a flat bed without using bedrails?: A Little Help needed moving from lying on your back to sitting on the side of a flat bed without using bedrails?: A Little Help needed moving to and from a bed to a chair (including a wheelchair)?: Total Help needed standing up from a chair using your arms (e.g., wheelchair or bedside chair)?: Total Help needed to walk in hospital room?: Total Help needed climbing 3-5 steps with a railing? : Total 6 Click Score: 10    End of Session Equipment Utilized During Treatment: Gait belt Activity Tolerance: Patient tolerated treatment well Patient left: in bed;with call bell/phone within reach;with bed alarm set Nurse Communication: Mobility status PT Visit Diagnosis: Other abnormalities of gait and mobility (R26.89);Muscle weakness (generalized) (M62.81);History of falling (Z91.81);Pain Pain - Right/Left: Left Pain - part of body: Leg    Time: 8841-6606 PT Time Calculation (min) (ACUTE ONLY): 18 min   Charges:   PT Evaluation $PT Eval Moderate Complexity:  Hendrix [name] PT, DPT Acute Rehabilitation Services Please use secure chat or  Call Office [phone]   [place] 08/29/2021, 12:20 PM

## 2021-08-29 NOTE — H&P (View-Only) (Signed)
Patient ID: [name], female   DOB: 07/18/1965, 56 y.o.   MRN: [id number] Patient is postoperative day 1 debridement left hip.  There was purulent material and a large hematoma.  Tissue was sent for cultures.  Patient has filled up 3 wound VAC canisters.  Plan to return to the operating room tomorrow.  Discussed with the patient that once we have a healthy granulating wound bed we will proceed with final closure.  Cultures pending.  CBC pending.

## 2021-08-30 DIAGNOSIS — B952 Enterococcus as the cause of diseases classified elsewhere: Secondary | ICD-10-CM | POA: Diagnosis not present

## 2021-08-30 DIAGNOSIS — Z992 Dependence on renal dialysis: Secondary | ICD-10-CM | POA: Diagnosis not present

## 2021-08-30 DIAGNOSIS — M86252 Subacute osteomyelitis, left femur: Secondary | ICD-10-CM | POA: Diagnosis not present

## 2021-08-30 DIAGNOSIS — N186 End stage renal disease: Secondary | ICD-10-CM | POA: Diagnosis not present

## 2021-08-30 LAB — BPAM RBC
Blood Product Expiration Date: 202307212359
Blood Product Expiration Date: 202307212359
ISSUE DATE / TIME: 202306291235
ISSUE DATE / TIME: 202306291447
Unit Type and Rh: 6200
Unit Type and Rh: 6200

## 2021-08-30 LAB — TYPE AND SCREEN
ABO/RH(D): A POS
Antibody Screen: NEGATIVE
Unit division: 0
Unit division: 0

## 2021-08-30 LAB — COMPREHENSIVE METABOLIC PANEL
ALT: <5 U/L (ref 0–44)
AST: 11 U/L — ABNORMAL LOW (ref 15–41)
Albumin: 2.2 g/dL — ABNORMAL LOW (ref 3.5–5.0)
Alkaline Phosphatase: 136 U/L — ABNORMAL HIGH (ref 38–126)
Anion gap: 12 (ref 5–15)
BUN: 37 mg/dL — ABNORMAL HIGH (ref 6–20)
CO2: 26 mmol/L (ref 22–32)
Calcium: 7.8 mg/dL — ABNORMAL LOW (ref 8.9–10.3)
Chloride: 91 mmol/L — ABNORMAL LOW (ref 98–111)
Creatinine, Ser: 6.06 mg/dL — ABNORMAL HIGH (ref 0.44–1.00)
GFR, Estimated: 8 mL/min — ABNORMAL LOW (ref >60)
Glucose, Bld: 243 mg/dL — ABNORMAL HIGH (ref 70–99)
Potassium: 3.7 mmol/L (ref 3.5–5.1)
Sodium: 129 mmol/L — ABNORMAL LOW (ref 135–145)
Total Bilirubin: 0.7 mg/dL (ref 0.3–1.2)
Total Protein: 6.2 g/dL — ABNORMAL LOW (ref 6.5–8.1)

## 2021-08-30 LAB — CBC
HCT: 25.6 % — ABNORMAL LOW (ref 36.0–46.0)
Hemoglobin: 8 g/dL — ABNORMAL LOW (ref 12.0–15.0)
MCH: 29 pg (ref 26.0–34.0)
MCHC: 31.3 g/dL (ref 30.0–36.0)
MCV: 92.8 fL (ref 80.0–100.0)
Platelets: 239 10*3/uL (ref 150–400)
RBC: 2.76 MIL/uL — ABNORMAL LOW (ref 3.87–5.11)
RDW: 16.8 % — ABNORMAL HIGH (ref 11.5–15.5)
WBC: 9.7 10*3/uL (ref 4.0–10.5)
nRBC: 0 % (ref 0.0–0.2)

## 2021-08-30 LAB — GLUCOSE, CAPILLARY
Glucose-Capillary: 271 mg/dL — ABNORMAL HIGH (ref 70–99)
Glucose-Capillary: 133 mg/dL — ABNORMAL HIGH (ref 70–99)
Glucose-Capillary: 183 mg/dL — ABNORMAL HIGH (ref 70–99)
Glucose-Capillary: 315 mg/dL — ABNORMAL HIGH (ref 70–99)

## 2021-08-30 LAB — HEPATITIS B SURFACE ANTIBODY, QUANTITATIVE: Hep B S AB Quant (Post): <3.1 m[IU]/mL — ABNORMAL LOW (ref >9.9)

## 2021-08-30 LAB — CK: Total CK: 21 U/L — ABNORMAL LOW (ref 38–234)

## 2021-08-30 MED ORDER — INSULIN GLARGINE-YFGN 100 UNIT/ML ~~LOC~~ SOLN
10.0000 [IU] | Freq: Every day | SUBCUTANEOUS | Status: DC
  Administered 2021-08-30 – 2021-09-01 (×3): 10 [IU] via SUBCUTANEOUS
  Filled 2021-08-30 (×4): qty 0.1

## 2021-08-30 MED ORDER — CHLORHEXIDINE GLUCONATE 4 % EX LIQD
60.0000 mL | Freq: Once | CUTANEOUS | Status: AC
  Administered 2021-08-30: 4 via TOPICAL
  Filled 2021-08-30: qty 60

## 2021-08-30 MED ORDER — HEPARIN SODIUM (PORCINE) 1000 UNIT/ML DIALYSIS: 1000.0000 [IU] | INTRAMUSCULAR | Status: DC | PRN

## 2021-08-30 MED ORDER — PENTAFLUOROPROP-TETRAFLUOROETH EX AERO: 1.0000 | INHALATION_SPRAY | CUTANEOUS | Status: DC | PRN

## 2021-08-30 MED ORDER — POVIDONE-IODINE 10 % EX SWAB: 2.0000 | Freq: Once | CUTANEOUS | Status: DC

## 2021-08-30 MED ORDER — INSULIN ASPART 100 UNIT/ML IJ SOLN
2.0000 [IU] | Freq: Three times a day (TID) | INTRAMUSCULAR | Status: DC
  Administered 2021-08-31 – 2021-09-01 (×5): 2 [IU] via SUBCUTANEOUS

## 2021-08-30 MED ORDER — PENTAFLUOROPROP-TETRAFLUOROETH EX AERO
1.0000 | INHALATION_SPRAY | CUTANEOUS | Status: DC | PRN
Start: 2021-08-30 — End: 2021-08-30

## 2021-08-30 MED ORDER — CEFAZOLIN SODIUM-DEXTROSE 2-4 GM/100ML-% IV SOLN: 2.0000 g | INTRAVENOUS | Status: DC

## 2021-08-30 NOTE — Progress Notes (Signed)
Pt requires a cartridge change at 0915 due to TMP pressures being elevated.  Another cartridge change was required at 1022.  Pt is alert and oriented x4.  Intervention is to increase saline flushes to 100 mls every 15 minutes instead of every 30 minutes.  Per charge nurse Veronica's suggestion.

## 2021-08-30 NOTE — Progress Notes (Signed)
Received patient in bed, alert and oriented. Informed consent signed and in chart.  Time tx completed: 3:03 of prescribed 4 hr tx.   HD treatment completed. Patient tolerated well. Fistula/Graft/HD catheter without signs and symptoms of complications. Patient transported back to the room, alert and orient and in no acute distress. Report given to bedside RN.  Total UF removed:2400 mls  Medication given: Aranesp 60 mcg  Post HD VS:See chart  Post HD weight: 89.8 kg  Several cartridge changes throughout tx, pt surger rescheduled for tomorrow 08/31/2021.  Left unit, stable, alert and oriented x4.  No further concerns.

## 2021-08-30 NOTE — Interval H&P Note (Signed)
History and Physical Interval Note:  08/30/2021 6:49 AM  [name]  has presented today for surgery, with the diagnosis of Wound Dehiscence Left Hip.  The various methods of treatment have been discussed with the patient and family. After consideration of risks, benefits and other options for treatment, the patient has consented to  Procedure(s): REPEAT DEBRIDEMENT LEFT HIP (Left) as a surgical intervention.  The patient's history has been reviewed, patient examined, no change in status, stable for surgery.  I have reviewed the patient's chart and labs.  Questions were answered to the patient's satisfaction.     Newt Minion

## 2021-08-30 NOTE — Progress Notes (Signed)
Pt completed 3:03 of 4 hr prescribed tx, Total UF removed was 2400 mls.  Pt scheduled for surgery today but was informed it was rescheduled to tomorrow 08/31/2021.  Pt made aware.  Floor nurse made aware pt will be returning to her assigned room.

## 2021-08-30 NOTE — Progress Notes (Signed)
[facility]  Date of Admission:  08/26/2021     Total days of antibiotics 34         ASSESSMENT:  Ms. Pinette cultures are growing Vancomycin resistant Eneterococcus faecium. Antibiotics have been changed to Daptomycin. Will undergo repeat I&D today. Will need prolonged course of antibiotics with dialysis with additional recommendations pending surgical outcomes. Remaining medical and supportive care per primary team.   PLAN:  Continue Daptomycin Repeat I&D today. Further recommendations pending surgical outcomes.  Remaining medical and supportive care per primary care.   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  Active Problems:   TOBACCO DEPENDENCE   GASTROESOPHAGEAL REFLUX, NO ESOPHAGITIS   Anxiety and depression   Wound infection   ESRD (end stage renal disease) on dialysis (HCC)   Benign essential HTN   Dyslipidemia, goal LDL below 100   Fracture of femoral condyle, left, closed (HCC)   Uncontrolled type 2 diabetes mellitus with hypoglycemia, with long-term current use of insulin (HCC)   History of CVA (cerebrovascular accident)   Right knee pain   Open toe wound of right 5th toe    Iron deficiency anemia, unspecified   Wound dehiscence, surgical, sequela    alosetron  1 mg Oral BID   amitriptyline  25 mg Oral QHS   amLODipine  5 mg Oral Daily   atorvastatin  40 mg Oral q1800   carvedilol  6.25 mg Oral BID   Chlorhexidine Gluconate Cloth  6 each Topical Q0600   Chlorhexidine Gluconate Cloth  6 each Topical Daily   cyclobenzaprine  10 mg Oral TID   darbepoetin (ARANESP) injection - DIALYSIS  60 mcg Intravenous Q Fri-HD   docusate sodium  100 mg Oral BID   DULoxetine  60 mg Oral Daily   heparin  5,000 Units Subcutaneous Q8H   hydrALAZINE  50 mg Oral TID   insulin aspart  0-6 Units Subcutaneous TID WC   insulin glargine-yfgn  5 Units Subcutaneous QHS   multivitamin  1 tablet Oral QHS   mupirocin  ointment  1 Application Nasal BID   pantoprazole  80 mg Oral Daily   povidone-iodine  2 Application Topical Once   sevelamer carbonate  1,600 mg Oral TID WC   sodium chloride flush  3 mL Intravenous Q12H    SUBJECTIVE:   Afebrile overnight with no acute events. Feeling tired. Seen in dialysis prior to surgery.   Allergies  Allergen Reactions   Trazodone Swelling   Latex Rash   Lidocaine Itching     Review of Systems: Review of Systems  Constitutional:  Positive for malaise/fatigue. Negative for chills, fever and weight loss.  Respiratory:  Negative for cough, shortness of breath and wheezing.   Cardiovascular:  Negative for chest pain and leg swelling.  Gastrointestinal:  Negative for abdominal pain, constipation, diarrhea, nausea and vomiting.  Skin:  Negative for rash.      OBJECTIVE: Vitals:   08/30/21 1000 08/30/21 1030 08/30/21 1100 08/30/21 1130  BP: (!) 144/50 (!) 134/53 (!) 130/53 (!) 132/52  Pulse: 66 69 66 66  Resp: 11 12 10 11   Temp:      TempSrc:      SpO2:      Weight:      Height:       Body mass index is 28.38 kg/m.  Physical Exam Constitutional:      General: She is not in acute distress.    Appearance: She  is well-developed.  Cardiovascular:     Rate and Rhythm: Normal rate and regular rhythm.     Heart sounds: Normal heart sounds.  Pulmonary:     Effort: Pulmonary effort is normal.     Breath sounds: Normal breath sounds.  Musculoskeletal:     Comments: Wound vac in place with appropriate suction. Drainage in the canister.   Skin:    General: Skin is warm and dry.  Neurological:     Mental Status: She is alert and oriented to person, place, and time.  Psychiatric:        Mood and Affect: Mood normal.     Lab Results Lab Results  Component Value Date   WBC 9.7 08/30/2021   HGB 8.0 (L) 08/30/2021   HCT 25.6 (L) 08/30/2021   MCV 92.8 08/30/2021   PLT 239 08/30/2021    Lab Results  Component Value Date   CREATININE 6.06 (H)  08/30/2021   BUN 37 (H) 08/30/2021   NA 129 (L) 08/30/2021   K 3.7 08/30/2021   CL 91 (L) 08/30/2021   CO2 26 08/30/2021    Lab Results  Component Value Date   ALT <5 08/30/2021   AST 11 (L) 08/30/2021   ALKPHOS 136 (H) 08/30/2021   BILITOT 0.7 08/30/2021     Microbiology: Recent Results (from the past 240 hour(s))  Surgical pcr screen     Status: Abnormal   Collection Time: 08/28/21  5:09 AM   Specimen: Nasal Mucosa; Nasal Swab  Result Value Ref Range Status   MRSA, PCR NEGATIVE NEGATIVE Final   Staphylococcus aureus POSITIVE (A) NEGATIVE Final    Comment: (NOTE) The Xpert SA Assay (FDA approved for NASAL specimens in patients 3 years of age and older), is one component of a comprehensive surveillance program. It is not intended to diagnose infection nor to guide or monitor treatment. Performed at [facility], [place] [address], [place], [place] [address]   Aerobic/Anaerobic Culture w Gram Stain (surgical/deep wound)     Status: None (Preliminary result)   Collection Time: 08/28/21  9:17 AM   Specimen: Soft Tissue, Other  Result Value Ref Range Status   Specimen Description TISSUE  Final   Special Requests LEFT THIGH TISSUE  Final   Gram Stain   Final    RARE WBC PRESENT,BOTH PMN AND MONONUCLEAR NO ORGANISMS SEEN Performed at [facility], [place] [address] [place], [place] [address]    Culture   Final    MODERATE ENTEROCOCCUS FAECIUM VANCOMYCIN RESISTANT ENTEROCOCCUS ISOLATED NO ANAEROBES ISOLATED; CULTURE IN PROGRESS FOR 5 DAYS    Report Status PENDING  Incomplete   Organism ID, Bacteria ENTEROCOCCUS FAECIUM  Final      Susceptibility   Enterococcus faecium - MIC*    AMPICILLIN >=32 RESISTANT Resistant     VANCOMYCIN >=32 RESISTANT Resistant     GENTAMICIN SYNERGY SENSITIVE Sensitive     LINEZOLID 2 SENSITIVE Sensitive     * MODERATE ENTEROCOCCUS FAECIUM     Terri Piedra, NP [facility]  08/30/2021  12:31 PM

## 2021-08-30 NOTE — Progress Notes (Signed)
[facility] Progress Note   Subjective:   Seen on HD, tolerating well. Denies SOB, CP, dizziness. Feels her edema is improving. Having some hip pain and says plan is for another debridement today.   Objective Vitals:   08/30/21 0550 08/30/21 0803 08/30/21 0817 08/30/21 0830  BP: (!) 137/49  (!) 145/56 (!) 110/54  Pulse: 64  64 63  Resp: 18  14 16   Temp: 98.2 F (36.8 C)  98 F (36.7 C)   TempSrc: Oral  Oral   SpO2: 100%  98% 98%  Weight:  92.3 kg    Height:       Physical Exam General: [name] Heart: RRR, no murmurs, rubs or gallops Lungs: CTA anteriorly without wheezing, rhonchi or rales Abdomen: Soft, non-distended. Mild edema in R flank Extremities: 1+ pitting edema right lower extremity, L BKA Dialysis Access: AVF accessed  Additional Objective Labs: Basic Metabolic Panel: Recent Labs  Lab 08/23/21 0907 08/26/21 1225 08/27/21 0512 08/28/21 0349 08/30/21 0645  NA 127*   < > 131* 132* 129*  K 4.8   < > 4.1 3.5 3.7  CL 89*   < > 96* 92* 91*  CO2 26   < > 26 28 26   GLUCOSE 164*   < > 167* 208* 243*  BUN 42*   < > 45* 23* 37*  CREATININE 4.86*   < > 6.81* 4.32* 6.06*  CALCIUM 8.4*   < > 8.1* 7.8* 7.8*  PHOS 4.4  --   --  3.5  --    < > = values in this interval not displayed.   Liver Function Tests: Recent Labs  Lab 08/23/21 0907 08/28/21 0349 08/30/21 0645  AST  --   --  11*  ALT  --   --  <5  ALKPHOS  --   --  136*  BILITOT  --   --  0.7  PROT  --   --  6.2*  ALBUMIN 2.2* 2.1* 2.2*   No results for input(s): "LIPASE", "AMYLASE" in the last 168 hours. CBC: Recent Labs  Lab 08/26/21 1225 08/27/21 0512 08/28/21 0349 08/29/21 0802 08/29/21 2010 08/30/21 0645  WBC 13.1* 10.7* 8.6 8.0  --  9.7  NEUTROABS 10.2*  --   --   --   --   --   HGB 8.7* 9.3* 9.3* 6.5* 8.5* 8.0*  HCT 29.3* 30.4* 28.8* 20.8* 25.6* 25.6*  MCV 98.0 94.1 93.8 95.9  --  92.8  PLT 269 277 234 217  --  239   Blood Culture    Component Value  Date/Time   SDES TISSUE 08/28/2021 [place] TISSUE 08/28/2021 0917   CULT  08/28/2021 0917    MODERATE ENTEROCOCCUS FAECIUM SUSCEPTIBILITIES TO FOLLOW Performed at [facility] [address], [place], [place] [address]    REPTSTATUS PENDING 08/28/2021 0076    Cardiac Enzymes: Recent Labs  Lab 08/30/21 0645  CKTOTAL 21*   CBG: Recent Labs  Lab 08/29/21 0735 08/29/21 1220 08/29/21 1633 08/29/21 2107 08/30/21 0718  GLUCAP 380* 195* 219* 232* 271*   Iron Studies: No results for input(s): "IRON", "TIBC", "TRANSFERRIN", "FERRITIN" in the last 72 hours. @lablastinr3 @ Studies/Results: No results found. Medications:  sodium chloride     sodium chloride Stopped (08/29/21 0139)    ceFAZolin (ANCEF) IV     DAPTOmycin (CUBICIN) 900 mg in sodium chloride 0.9 % IVPB Stopped (08/29/21 2342)   methocarbamol (ROBAXIN) IV  alosetron  1 mg Oral BID   amitriptyline  25 mg Oral QHS   amLODipine  5 mg Oral Daily   atorvastatin  40 mg Oral q1800   carvedilol  6.25 mg Oral BID   Chlorhexidine Gluconate Cloth  6 each Topical Q0600   Chlorhexidine Gluconate Cloth  6 each Topical Daily   cyclobenzaprine  10 mg Oral TID   darbepoetin (ARANESP) injection - DIALYSIS  60 mcg Intravenous Q Fri-HD   docusate sodium  100 mg Oral BID   DULoxetine  60 mg Oral Daily   heparin  5,000 Units Subcutaneous Q8H   hydrALAZINE  50 mg Oral TID   insulin aspart  0-6 Units Subcutaneous TID WC   insulin glargine-yfgn  5 Units Subcutaneous QHS   multivitamin  1 tablet Oral QHS   mupirocin ointment  1 Application Nasal BID   pantoprazole  80 mg Oral Daily   povidone-iodine  2 Application Topical Once   sevelamer carbonate  1,600 mg Oral TID WC   sodium chloride flush  3 mL Intravenous Q12H    Dialysis Orders: [facility] MWF  4h  400/500  90kg  2/2 bath  P2   -Heparin 4000 units IV TIW  LUA AVF  Assessment/Plan: L hip infection - sp recent L hip surgery on 08/10/21. Now  appears to have persistent infection. Went to OR 08/28/2021 for L hip debridement, wound vac placement. Going back to OR today.  ESRD - on HD MWF. Serial HD 06/28, 6/29/ and 6/30. Will plan for next HD Monday.  HTN/ vol - BP high on admission, now improved. She is now only 2.3kg above her EDW after serial HD but still has a signficant amount of edema. Suspect she may have lost some weight. Occasional soft BP here, follow trend and will adjust meds as needed.    Anemia esrd - HGB 6.5 yesterday. Likely worsened from wound drainage. Getting ESA today. S/p PRBC. Holding heparin with HD MBD ckd - CCa in range, PO4 at goal.  Cont binders.  DM2 - per pmd   Anice Paganini, PA-C 08/30/2021, 8:51 AM  [facility] Pager: [phone]

## 2021-08-30 NOTE — Progress Notes (Signed)
Progress Note   Patient: [name] XIP:[id number] DOB: [birth year] DOA: 08/26/2021     4 DOS: the patient was seen and examined on 08/30/2021   Brief hospital course: 56 y.o. female with past medical history significant for HTN, HLD, ESRD on HD MWF, history of left BKA 2021, depression/anxiety, history of CVA, tobacco use disorder, type 2 diabetes mellitus, insomnia, chronic pain syndrome, recent mechanical fall with left femur fracture deemed nonoperable by orthopedics and recent superficial abscess left hip s/p I&D 5/28 with partial resection left femur for osteomyelitis with soft tissue debridement who presents to [facility] ED on 6/26 with complaints of bloody drainage from her surgical incision site.  Patient reports some drainage occasionally, worse with any type of movement.  Given the drainage increasing EMS was called to evaluate.  Patient also complains of right knee pain, states twisted right knee a few days prior.  Denies fever/chills, no visual changes, no headache, no chest pain, no palpitations, no shortness of breath, no cough/congestion, no nausea/vomiting/diarrhea, no urinary symptoms.  Currently receiving cefazolin with HD per ID recommendations.   In the ED, temperature 97.7 F, HR 76, RR 17, BP 170/65, SPO2 97% on room air.  WBC 13.1, hemoglobin 8.7, platelets 269.  Sodium 133, potassium 4.4, chloride 93, CO2 25, glucose 191, BUN 41, creatinine 6.41.  Lactic acid 1.2.  CT left hip without contrast with comminuted ununited left intratrochanteric fracture, prior intramedullary nail and interlocking femoral neck screw which had been removed, soft tissue wound overlying greater trochanter with air extending from the skin surface to the defect in the proximal femoral diaphysis into the joint space, appearance concerning for infection, persistent partial osteolysis posterior lateral cortex of the proximal femoral diaphysis, no drainable fluid collection, soft tissue wound overlying lateral  aspect proximal femoral diaphysis with small bubble of air within the soft tissues which may be postsurgical versus secondary to soft tissue infection.  Orthopedics, Dr. [name] was consulted.  Hospital service consulted for further evaluation management for likely need of further operative management/I&D.  Assessment and Plan: Left femur osteomyelitis with abscess Patient presenting with increased drainage from left hip surgical site.  History of hospital-itis with abscess s/p hardware removal and partial resection of left femur and I&D abscess and soft tissue.  CT left hip on admission shows concern for infection within the soft tissues. --Orthopedics, Dr. [name] and infectious disease following, appreciate assistance --Continue cefazolin with HD --S/p I/d 6/28 with repeat debridement planned 7/1 -ID recs for continued daptomycin   Hx Fracture femoral condyle, left, closed History of left femoral hip fracture 07/2021 that was deemed inoperable by orthopedics. --Nonweightbearing left lower extremity   Right knee pain likely secondary to sprain Patient reports twisting her knee a few days prior.  X-ray right knee with soft tissue edema without osseous abnormality. --Pain control, supportive care   ESRD on HD MWF --Nephrology following -Seen on HD this AM   Type 2 diabetes mellitus Hemoglobin A1c 7.01 Jul 2021.  Home regimen includes Lantus 10 units copiously daily, NovoLog 10 units 3 times daily AC. --Semglee increased to 10 units -Will add novolog 2 units with meals --Novolog SSI for coverage --CBGs qAC/HS   Essential hypertension --Amlodipine 5 mg p.o. daily --Carvedilol 6.25 mg p.o. twice daily --Hydralazine 50 mg p.o. TID   HLD: Atorvastatin 40 mg p.o. daily   Anxiety/depression: --Cymbalta 60 mg p.o. daily --Elavil 25 mg p.o. nightly   GERD: Continue PPI   Iron deficiency anemia with blood loss anemia  Baseline hemoglobin around 9 (8.0 at most recent discharge).  Hemoglobin  8.7 on admission. --Transfuse for hemoglobin less than 7.0 -On 6/29 post op, hgb down to 6.5, 1 unit PRBC given --CBC stable      Subjective:  Seen on HD this AM. Without complaints  Physical Exam: Vitals:   08/30/21 1130 08/30/21 1200 08/30/21 1230 08/30/21 1313  BP: (!) 132/52 (!) 142/52 (!) 138/49   Pulse: 66 69 69   Resp: 11 14 (!) 9   Temp:      TempSrc:      SpO2:      Weight:    89.8 kg  Height:       General exam: Awake, laying in bed, in nad Respiratory system: Normal respiratory effort, no wheezing Cardiovascular system: regular rate, s1, s2 Gastrointestinal system: Soft, nondistended, positive BS Central nervous system: CN2-12 grossly intact, strength intact Extremities: Perfused, no clubbing Skin: Normal skin turgor, no notable skin lesions seen Psychiatry: Mood normal // no visual hallucinations   Data Reviewed:  Labs reviewed: Na 129, Hgb 8.0  Family Communication: Pt in room, family not at bedside  Disposition: Status is: Inpatient Remains inpatient appropriate because: Severity of illness  Planned Discharge Destination: Home    Author: Marylu Lund, MD 08/30/2021 4:32 PM  For on call review [email or web address].

## 2021-08-30 NOTE — Progress Notes (Signed)
   OT Cancellation Note   Patient Details Name: [name] MRN: [id number] DOB: Jul 21, 1965     Cancelled Treatment:    Reason Eval/Treat Not Completed. Patient at procedure or test/unavailable Pt is off the floor for HD. OT will follow back this afternoon as able.      [place] 08/30/2021 08:30AM

## 2021-08-30 NOTE — Progress Notes (Signed)
PT Cancellation Note  Patient Details Name: [name] MRN: [id number] DOB: 1966-02-06   Cancelled Treatment:    Reason Eval/Treat Not Completed: (P) Patient at procedure or test/unavailable Pt is off the floor for HD. PT will follow back this afternoon as able.  Brice Kossman B. [name] PT, DPT Acute Rehabilitation Services Please use secure chat or  Call Office [phone]    [place] 08/30/2021, 8:34 AM

## 2021-08-30 NOTE — Progress Notes (Signed)
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  Pt placed on contact precautions.  Primary Nurse notified.

## 2021-08-31 ENCOUNTER — Other Ambulatory Visit: Payer: Self-pay

## 2021-08-31 ENCOUNTER — Inpatient Hospital Stay (HOSPITAL_COMMUNITY): Payer: Medicare Other | Admitting: Certified Registered"

## 2021-08-31 ENCOUNTER — Encounter (HOSPITAL_COMMUNITY): Payer: Self-pay | Admitting: Family Medicine

## 2021-08-31 ENCOUNTER — Encounter (HOSPITAL_COMMUNITY)
Admission: EM | Disposition: A | Discharge status: Home Health | Payer: Self-pay | Source: Home / Self Care | Attending: Internal Medicine

## 2021-08-31 DIAGNOSIS — Z8673 Personal history of transient ischemic attack (TIA), and cerebral infarction without residual deficits: Secondary | ICD-10-CM | POA: Diagnosis not present

## 2021-08-31 DIAGNOSIS — F418 Other specified anxiety disorders: Secondary | ICD-10-CM | POA: Diagnosis not present

## 2021-08-31 DIAGNOSIS — T8131XA Disruption of external operation (surgical) wound, not elsewhere classified, initial encounter: Secondary | ICD-10-CM

## 2021-08-31 DIAGNOSIS — I10 Essential (primary) hypertension: Secondary | ICD-10-CM | POA: Diagnosis not present

## 2021-08-31 DIAGNOSIS — M86252 Subacute osteomyelitis, left femur: Secondary | ICD-10-CM | POA: Diagnosis not present

## 2021-08-31 DIAGNOSIS — M1990 Unspecified osteoarthritis, unspecified site: Secondary | ICD-10-CM

## 2021-08-31 HISTORY — PX: I & D EXTREMITY: SHX5045

## 2021-08-31 LAB — CBC
HCT: 26.1 % — ABNORMAL LOW (ref 36.0–46.0)
Hemoglobin: 8.3 g/dL — ABNORMAL LOW (ref 12.0–15.0)
MCH: 29.9 pg (ref 26.0–34.0)
MCHC: 31.8 g/dL (ref 30.0–36.0)
MCV: 93.9 fL (ref 80.0–100.0)
Platelets: 212 10*3/uL (ref 150–400)
RBC: 2.78 MIL/uL — ABNORMAL LOW (ref 3.87–5.11)
RDW: 16.6 % — ABNORMAL HIGH (ref 11.5–15.5)
WBC: 9 10*3/uL (ref 4.0–10.5)
nRBC: 0 % (ref 0.0–0.2)

## 2021-08-31 LAB — COMPREHENSIVE METABOLIC PANEL
ALT: <5 U/L (ref 0–44)
AST: 9 U/L — ABNORMAL LOW (ref 15–41)
Albumin: 1.9 g/dL — ABNORMAL LOW (ref 3.5–5.0)
Alkaline Phosphatase: 121 U/L (ref 38–126)
Anion gap: 11 (ref 5–15)
BUN: 29 mg/dL — ABNORMAL HIGH (ref 6–20)
CO2: 28 mmol/L (ref 22–32)
Calcium: 7.8 mg/dL — ABNORMAL LOW (ref 8.9–10.3)
Chloride: 92 mmol/L — ABNORMAL LOW (ref 98–111)
Creatinine, Ser: 4.34 mg/dL — ABNORMAL HIGH (ref 0.44–1.00)
GFR, Estimated: 12 mL/min — ABNORMAL LOW (ref >60)
Glucose, Bld: 276 mg/dL — ABNORMAL HIGH (ref 70–99)
Potassium: 3.9 mmol/L (ref 3.5–5.1)
Sodium: 131 mmol/L — ABNORMAL LOW (ref 135–145)
Total Bilirubin: 0.6 mg/dL (ref 0.3–1.2)
Total Protein: 5.5 g/dL — ABNORMAL LOW (ref 6.5–8.1)

## 2021-08-31 LAB — GLUCOSE, CAPILLARY
Glucose-Capillary: 230 mg/dL — ABNORMAL HIGH (ref 70–99)
Glucose-Capillary: 174 mg/dL — ABNORMAL HIGH (ref 70–99)
Glucose-Capillary: 167 mg/dL — ABNORMAL HIGH (ref 70–99)
Glucose-Capillary: 355 mg/dL — ABNORMAL HIGH (ref 70–99)
Glucose-Capillary: 442 mg/dL — ABNORMAL HIGH (ref 70–99)

## 2021-08-31 SURGERY — IRRIGATION AND DEBRIDEMENT EXTREMITY
Anesthesia: General | Site: Hip | Laterality: Left

## 2021-08-31 MED ORDER — CHLORHEXIDINE GLUCONATE 4 % EX LIQD
60.0000 mL | Freq: Once | CUTANEOUS | Status: DC
  Filled 2021-08-31: qty 60

## 2021-08-31 MED ORDER — FENTANYL CITRATE (PF) 100 MCG/2ML IJ SOLN
INTRAMUSCULAR | Status: AC
  Filled 2021-08-31: qty 2

## 2021-08-31 MED ORDER — LIDOCAINE 2% (20 MG/ML) 5 ML SYRINGE
INTRAMUSCULAR | Status: DC | PRN
  Administered 2021-08-31: 60 mg via INTRAVENOUS

## 2021-08-31 MED ORDER — PROMETHAZINE HCL 25 MG/ML IJ SOLN: 6.2500 mg | INTRAMUSCULAR | Status: DC | PRN

## 2021-08-31 MED ORDER — SODIUM CHLORIDE 0.9 % IV SOLN: INTRAVENOUS | Status: DC

## 2021-08-31 MED ORDER — CEFAZOLIN SODIUM-DEXTROSE 2-4 GM/100ML-% IV SOLN
2.0000 g | INTRAVENOUS | Status: AC
  Administered 2021-08-31: 2 g via INTRAVENOUS
  Filled 2021-08-31: qty 100

## 2021-08-31 MED ORDER — OXYCODONE HCL 5 MG PO TABS: 5.0000 mg | ORAL_TABLET | Freq: Once | ORAL | Status: DC | PRN

## 2021-08-31 MED ORDER — METOCLOPRAMIDE HCL 5 MG PO TABS: 5.0000 mg | ORAL_TABLET | Freq: Three times a day (TID) | ORAL | Status: DC | PRN

## 2021-08-31 MED ORDER — AMISULPRIDE (ANTIEMETIC) 5 MG/2ML IV SOLN: 10.0000 mg | Freq: Once | INTRAVENOUS | Status: DC | PRN

## 2021-08-31 MED ORDER — ONDANSETRON HCL 4 MG/2ML IJ SOLN
INTRAMUSCULAR | Status: DC | PRN
  Administered 2021-08-31: 4 mg via INTRAVENOUS

## 2021-08-31 MED ORDER — POLYETHYLENE GLYCOL 3350 17 G PO PACK: 17.0000 g | PACK | Freq: Every day | ORAL | Status: DC | PRN

## 2021-08-31 MED ORDER — DOCUSATE SODIUM 100 MG PO CAPS: 100.0000 mg | ORAL_CAPSULE | Freq: Two times a day (BID) | ORAL | Status: DC

## 2021-08-31 MED ORDER — 0.9 % SODIUM CHLORIDE (POUR BTL) OPTIME
TOPICAL | Status: DC | PRN
  Administered 2021-08-31: 2000 mL

## 2021-08-31 MED ORDER — INSULIN ASPART 100 UNIT/ML IJ SOLN
0.0000 [IU] | INTRAMUSCULAR | Status: DC | PRN
  Administered 2021-08-31: 3 [IU] via SUBCUTANEOUS

## 2021-08-31 MED ORDER — ONDANSETRON HCL 4 MG/2ML IJ SOLN: 4.0000 mg | Freq: Four times a day (QID) | INTRAMUSCULAR | Status: DC | PRN

## 2021-08-31 MED ORDER — DEXAMETHASONE SODIUM PHOSPHATE 10 MG/ML IJ SOLN
INTRAMUSCULAR | Status: DC | PRN
  Administered 2021-08-31: 5 mg via INTRAVENOUS

## 2021-08-31 MED ORDER — METHOCARBAMOL 500 MG PO TABS: 500.0000 mg | ORAL_TABLET | Freq: Four times a day (QID) | ORAL | Status: DC | PRN

## 2021-08-31 MED ORDER — BISACODYL 10 MG RE SUPP: 10.0000 mg | Freq: Every day | RECTAL | Status: DC | PRN

## 2021-08-31 MED ORDER — POVIDONE-IODINE 10 % EX SWAB: 2.0000 | Freq: Once | CUTANEOUS | Status: DC

## 2021-08-31 MED ORDER — MIDAZOLAM HCL 2 MG/2ML IJ SOLN
INTRAMUSCULAR | Status: AC
  Filled 2021-08-31: qty 2

## 2021-08-31 MED ORDER — ACETAMINOPHEN 10 MG/ML IV SOLN
INTRAVENOUS | Status: AC
  Filled 2021-08-31: qty 100

## 2021-08-31 MED ORDER — ONDANSETRON HCL 4 MG PO TABS: 4.0000 mg | ORAL_TABLET | Freq: Four times a day (QID) | ORAL | Status: DC | PRN

## 2021-08-31 MED ORDER — METHOCARBAMOL 1000 MG/10ML IJ SOLN: 500.0000 mg | Freq: Four times a day (QID) | INTRAVENOUS | Status: DC | PRN

## 2021-08-31 MED ORDER — CHLORHEXIDINE GLUCONATE 0.12 % MT SOLN
15.0000 mL | Freq: Once | OROMUCOSAL | Status: AC
Start: 2021-08-31 — End: 2021-08-31
  Administered 2021-08-31: 15 mL via OROMUCOSAL
  Filled 2021-08-31: qty 15

## 2021-08-31 MED ORDER — INSULIN ASPART 100 UNIT/ML IJ SOLN
4.0000 [IU] | Freq: Once | INTRAMUSCULAR | Status: AC
  Administered 2021-09-01: 4 [IU] via SUBCUTANEOUS

## 2021-08-31 MED ORDER — METOCLOPRAMIDE HCL 5 MG/ML IJ SOLN: 5.0000 mg | Freq: Three times a day (TID) | INTRAMUSCULAR | Status: DC | PRN

## 2021-08-31 MED ORDER — FENTANYL CITRATE (PF) 100 MCG/2ML IJ SOLN
25.0000 ug | INTRAMUSCULAR | Status: DC | PRN
  Administered 2021-08-31 (×2): 50 ug via INTRAVENOUS

## 2021-08-31 MED ORDER — ACETAMINOPHEN 10 MG/ML IV SOLN
1000.0000 mg | Freq: Once | INTRAVENOUS | Status: DC | PRN
  Administered 2021-08-31: 1000 mg via INTRAVENOUS

## 2021-08-31 MED ORDER — PROPOFOL 10 MG/ML IV BOLUS
INTRAVENOUS | Status: AC
  Filled 2021-08-31: qty 20

## 2021-08-31 MED ORDER — OXYCODONE HCL 5 MG/5ML PO SOLN: 5.0000 mg | Freq: Once | ORAL | Status: DC | PRN

## 2021-08-31 MED ORDER — FENTANYL CITRATE (PF) 250 MCG/5ML IJ SOLN
INTRAMUSCULAR | Status: DC | PRN
Start: 2021-08-31 — End: 2021-08-31
  Administered 2021-08-31 (×4): 25 ug via INTRAVENOUS

## 2021-08-31 MED ORDER — FENTANYL CITRATE (PF) 250 MCG/5ML IJ SOLN
INTRAMUSCULAR | Status: AC
  Filled 2021-08-31: qty 5

## 2021-08-31 MED ORDER — ORAL CARE MOUTH RINSE
15.0000 mL | Freq: Once | OROMUCOSAL | Status: AC
Start: 2021-08-31 — End: 2021-08-31

## 2021-08-31 MED ORDER — MIDAZOLAM HCL 2 MG/2ML IJ SOLN
INTRAMUSCULAR | Status: DC | PRN
  Administered 2021-08-31: 1 mg via INTRAVENOUS

## 2021-08-31 MED ORDER — PROPOFOL 10 MG/ML IV BOLUS
INTRAVENOUS | Status: DC | PRN
  Administered 2021-08-31: 100 mg via INTRAVENOUS

## 2021-08-31 SURGICAL SUPPLY — 41 items
BAG COUNTER SPONGE SURGICOUNT (BAG) IMPLANT
BAG SPNG CNTER NS LX DISP (BAG)
BLADE SURG 21 STRL SS (BLADE) ×2 IMPLANT
BNDG COHESIVE 6X5 TAN STRL LF (GAUZE/BANDAGES/DRESSINGS) IMPLANT
BNDG GAUZE ELAST 4 BULKY (GAUZE/BANDAGES/DRESSINGS) ×2 IMPLANT
CANISTER WOUNDNEG PRESSURE 500 (CANNISTER) ×1 IMPLANT
COVER SURGICAL LIGHT HANDLE (MISCELLANEOUS) ×4 IMPLANT
DRAPE DERMATAC (DRAPES) ×2 IMPLANT
DRAPE INCISE IOBAN 85X60 (DRAPES) ×1 IMPLANT
DRAPE STERI 35X30 U-POUCH (DRAPES) ×1 IMPLANT
DRAPE U-SHAPE 47X51 STRL (DRAPES) ×1 IMPLANT
DRESSING PREVENA PLUS CUSTOM (GAUZE/BANDAGES/DRESSINGS) IMPLANT
DRSG ADAPTIC 3X8 NADH LF (GAUZE/BANDAGES/DRESSINGS) ×1 IMPLANT
DRSG MEPILEX SACRM 8.7X9.8 (GAUZE/BANDAGES/DRESSINGS) ×1 IMPLANT
DRSG PREVENA PLUS CUSTOM (GAUZE/BANDAGES/DRESSINGS) ×2
DURAPREP 26ML APPLICATOR (WOUND CARE) ×2 IMPLANT
ELECT REM PT RETURN 9FT ADLT (ELECTROSURGICAL) ×2
ELECTRODE REM PT RTRN 9FT ADLT (ELECTROSURGICAL) IMPLANT
GAUZE SPONGE 4X4 12PLY STRL (GAUZE/BANDAGES/DRESSINGS) ×1 IMPLANT
GLOVE BIOGEL PI IND STRL 9 (GLOVE) ×1 IMPLANT
GLOVE BIOGEL PI INDICATOR 9 (GLOVE) ×1
GLOVE SURG ORTHO 9.0 STRL STRW (GLOVE) ×2 IMPLANT
GOWN STRL REUS W/ TWL XL LVL3 (GOWN DISPOSABLE) ×2 IMPLANT
GOWN STRL REUS W/TWL XL LVL3 (GOWN DISPOSABLE) ×6
GRAFT SKIN WND SURGICLOSE M95 (Tissue) ×1 IMPLANT
HANDPIECE INTERPULSE COAX TIP (DISPOSABLE)
KIT BASIN OR (CUSTOM PROCEDURE TRAY) ×2 IMPLANT
KIT TURNOVER KIT B (KITS) ×2 IMPLANT
MANIFOLD NEPTUNE II (INSTRUMENTS) ×2 IMPLANT
NS IRRIG 1000ML POUR BTL (IV SOLUTION) ×2 IMPLANT
PACK ORTHO EXTREMITY (CUSTOM PROCEDURE TRAY) ×2 IMPLANT
PAD ARMBOARD 7.5X6 YLW CONV (MISCELLANEOUS) ×4 IMPLANT
SET HNDPC FAN SPRY TIP SCT (DISPOSABLE) IMPLANT
SPONGE T-LAP 18X18 ~~LOC~~+RFID (SPONGE) ×1 IMPLANT
STOCKINETTE IMPERVIOUS 9X36 MD (GAUZE/BANDAGES/DRESSINGS) IMPLANT
SUT ETHILON 2 0 PSLX (SUTURE) ×2 IMPLANT
SWAB COLLECTION DEVICE MRSA (MISCELLANEOUS) ×1 IMPLANT
SWAB CULTURE ESWAB REG 1ML (MISCELLANEOUS) IMPLANT
TOWEL GREEN STERILE (TOWEL DISPOSABLE) ×2 IMPLANT
TUBE CONNECTING 12X1/4 (SUCTIONS) ×2 IMPLANT
YANKAUER SUCT BULB TIP NO VENT (SUCTIONS) ×2 IMPLANT

## 2021-08-31 NOTE — Progress Notes (Signed)
CBG is 442, pt already had 10 units of long acting insulin. This RN messaged MD on call to see if he wanted to give any fast acting. Awaiting response.   Foster Simpson Kamaree Berkel

## 2021-08-31 NOTE — Progress Notes (Signed)
Progress Note   Patient: [name] HYI:[id number] DOB: [birth year] DOA: 08/26/2021     5 DOS: the patient was seen and examined on 08/31/2021   Brief hospital course: 56 y.o. female with past medical history significant for HTN, HLD, ESRD on HD MWF, history of left BKA 2021, depression/anxiety, history of CVA, tobacco use disorder, type 2 diabetes mellitus, insomnia, chronic pain syndrome, recent mechanical fall with left femur fracture deemed nonoperable by orthopedics and recent superficial abscess left hip s/p I&D 5/28 with partial resection left femur for osteomyelitis with soft tissue debridement who presents to [facility] ED on 6/26 with complaints of bloody drainage from her surgical incision site.  Patient reports some drainage occasionally, worse with any type of movement.  Given the drainage increasing EMS was called to evaluate.  Patient also complains of right knee pain, states twisted right knee a few days prior.  Denies fever/chills, no visual changes, no headache, no chest pain, no palpitations, no shortness of breath, no cough/congestion, no nausea/vomiting/diarrhea, no urinary symptoms.  Currently receiving cefazolin with HD per ID recommendations.   In the ED, temperature 97.7 F, HR 76, RR 17, BP 170/65, SPO2 97% on room air.  WBC 13.1, hemoglobin 8.7, platelets 269.  Sodium 133, potassium 4.4, chloride 93, CO2 25, glucose 191, BUN 41, creatinine 6.41.  Lactic acid 1.2.  CT left hip without contrast with comminuted ununited left intratrochanteric fracture, prior intramedullary nail and interlocking femoral neck screw which had been removed, soft tissue wound overlying greater trochanter with air extending from the skin surface to the defect in the proximal femoral diaphysis into the joint space, appearance concerning for infection, persistent partial osteolysis posterior lateral cortex of the proximal femoral diaphysis, no drainable fluid collection, soft tissue wound overlying lateral aspect  proximal femoral diaphysis with small bubble of air within the soft tissues which may be postsurgical versus secondary to soft tissue infection.  Orthopedics, Dr. [name] was consulted.  Hospital service consulted for further evaluation management for likely need of further operative management/I&D.  Assessment and Plan: Left femur osteomyelitis with abscess Patient presenting with increased drainage from left hip surgical site.  History of hospital-itis with abscess s/p hardware removal and partial resection of left femur and I&D abscess and soft tissue.  CT left hip on admission shows concern for infection within the soft tissues. --Orthopedics, Dr. [name] and infectious disease following, appreciate assistance --Continue cefazolin with HD --S/p I/d 6/28 with repeat debridement performed 7/1 -ID recs for continued daptomycin   Hx Fracture femoral condyle, left, closed History of left femoral hip fracture 07/2021 that was deemed inoperable by orthopedics. --Nonweightbearing left lower extremity   Right knee pain likely secondary to sprain Patient reports twisting her knee a few days prior.  X-ray right knee with soft tissue edema without osseous abnormality. --Pain control, supportive care   ESRD on HD MWF --Nephrology following -cont on hd as scheduled   Type 2 diabetes mellitus Hemoglobin A1c 7.01 Jul 2021.  Home regimen includes Lantus 10 units copiously daily, NovoLog 10 units 3 times daily AC. --Semglee at 10 units -On novolog 2 units with meals --Novolog SSI for coverage --CBGs qAC/HS   Essential hypertension --Amlodipine 5 mg p.o. daily --Carvedilol 6.25 mg p.o. twice daily --Hydralazine 50 mg p.o. TID   HLD: Atorvastatin 40 mg p.o. daily   Anxiety/depression: --Cymbalta 60 mg p.o. daily --Elavil 25 mg p.o. nightly   GERD: Continue PPI   Iron deficiency anemia with blood loss anemia Baseline hemoglobin  around 9 (8.0 at most recent discharge).  Hemoglobin 8.7 on  admission. --Transfuse for hemoglobin less than 7.0 -On 6/29 post op, hgb down to 6.5, 1 unit PRBC given --CBC stable      Subjective:  Seen on return from OR this AM. Complaining of L hip pain  Physical Exam: Vitals:   08/31/21 0830 08/31/21 1005 08/31/21 1020 08/31/21 1035  BP:  128/61 (!) 163/62 (!) 165/59  Pulse:  71 71 75  Resp:  16 10 12   Temp:  (!) 97 F (36.1 C)  (!) 97.1 F (36.2 C)  TempSrc:      SpO2:  97% 97% 98%  Weight: 89.8 kg     Height: 5' 10.98" (1.803 m)      General exam: Conversant, in no acute distress Respiratory system: normal chest rise, clear, no audible wheezing Cardiovascular system: regular rhythm, s1-s2 Gastrointestinal system: Nondistended, nontender, pos BS Central nervous system: No seizures, no tremors Extremities: No cyanosis, no joint deformities Skin: No rashes, no pallor Psychiatry: Affect normal // no auditory hallucinations   Data Reviewed:  Labs reviewed: Na 131, Hgb 8.3  Family Communication: Pt in room, family not at bedside  Disposition: Status is: Inpatient Remains inpatient appropriate because: Severity of illness  Planned Discharge Destination: Home    Author: Marylu Lund, MD 08/31/2021 1:29 PM  For on call review [email or web address].

## 2021-08-31 NOTE — Progress Notes (Signed)
Patient taken down to short stay.  Bedside report given to receiving RN.

## 2021-08-31 NOTE — Progress Notes (Signed)
[facility] Progress Note   Subjective:   Seen in PACU. Reports surgery went well but she is in a lot of pain. Denies SOB, CP, dizziness, and nausea.   Objective Vitals:   08/31/21 0827 08/31/21 0830 08/31/21 1005 08/31/21 1020  BP: (!) 158/53  128/61 (!) 163/62  Pulse: 65  71 71  Resp: 17  16 10   Temp: 98.2 F (36.8 C)  (!) 97 F (36.1 C)   TempSrc: Oral     SpO2: 96%  97% 97%  Weight:  89.8 kg    Height:  5' 10.98" (1.803 m)     Physical Exam General: Alert, appears uncomfortable Heart: RRR, no murmurs, rubs or gallops Lungs: CTA bilaterally without wheezing, rhonchi or rales Abdomen: Non-distended, +BS Extremities: Trace pretibial edema, surgical dressing on left hip Dialysis Access: AVF  Additional Objective Labs: Basic Metabolic Panel: Recent Labs  Lab 08/28/21 0349 08/30/21 0645 08/31/21 0351  NA 132* 129* 131*  K 3.5 3.7 3.9  CL 92* 91* 92*  CO2 28 26 28   GLUCOSE 208* 243* 276*  BUN 23* 37* 29*  CREATININE 4.32* 6.06* 4.34*  CALCIUM 7.8* 7.8* 7.8*  PHOS 3.5  --   --    Liver Function Tests: Recent Labs  Lab 08/28/21 0349 08/30/21 0645 08/31/21 0351  AST  --  11* 9*  ALT  --  <5 <5  ALKPHOS  --  136* 121  BILITOT  --  0.7 0.6  PROT  --  6.2* 5.5*  ALBUMIN 2.1* 2.2* 1.9*   No results for input(s): "LIPASE", "AMYLASE" in the last 168 hours. CBC: Recent Labs  Lab 08/26/21 1225 08/27/21 0512 08/28/21 0349 08/29/21 0802 08/29/21 2010 08/30/21 0645 08/31/21 0351  WBC 13.1* 10.7* 8.6 8.0  --  9.7 9.0  NEUTROABS 10.2*  --   --   --   --   --   --   HGB 8.7* 9.3* 9.3* 6.5* 8.5* 8.0* 8.3*  HCT 29.3* 30.4* 28.8* 20.8* 25.6* 25.6* 26.1*  MCV 98.0 94.1 93.8 95.9  --  92.8 93.9  PLT 269 277 234 217  --  239 212   Blood Culture    Component Value Date/Time   SDES TISSUE 08/28/2021 0917   SPECREQUEST LEFT THIGH TISSUE 08/28/2021 0917   CULT  08/28/2021 0917    MODERATE ENTEROCOCCUS FAECIUM VANCOMYCIN RESISTANT ENTEROCOCCUS  ISOLATED NO ANAEROBES ISOLATED; CULTURE IN PROGRESS FOR 5 DAYS    REPTSTATUS PENDING 08/28/2021 0917    Cardiac Enzymes: Recent Labs  Lab 08/30/21 0645  CKTOTAL 21*   CBG: Recent Labs  Lab 08/30/21 1338 08/30/21 1638 08/30/21 2135 08/31/21 0752 08/31/21 1019  GLUCAP 133* 183* 315* 230* 174*   Iron Studies: No results for input(s): "IRON", "TIBC", "TRANSFERRIN", "FERRITIN" in the last 72 hours. @lablastinr3 @ Studies/Results: No results found. Medications:  [MAR Hold] sodium chloride     sodium chloride Stopped (08/29/21 0139)   sodium chloride 10 mL/hr at 08/31/21 0905   acetaminophen     acetaminophen 1,000 mg (08/31/21 1030)   [MAR Hold] DAPTOmycin (CUBICIN) 900 mg in sodium chloride 0.9 % IVPB Stopped (08/29/21 2342)   [MAR Hold] methocarbamol (ROBAXIN) IV      [MAR Hold] alosetron  1 mg Oral BID   [MAR Hold] amitriptyline  25 mg Oral QHS   [MAR Hold] amLODipine  5 mg Oral Daily   [MAR Hold] atorvastatin  40 mg Oral q1800   [MAR Hold] carvedilol  6.25 mg Oral BID  chlorhexidine  60 mL Topical Once   [MAR Hold] Chlorhexidine Gluconate Cloth  6 each Topical Q0600   [MAR Hold] Chlorhexidine Gluconate Cloth  6 each Topical Daily   [MAR Hold] cyclobenzaprine  10 mg Oral TID   [MAR Hold] darbepoetin (ARANESP) injection - DIALYSIS  60 mcg Intravenous Q Fri-HD   [MAR Hold] docusate sodium  100 mg Oral BID   [MAR Hold] DULoxetine  60 mg Oral Daily   fentaNYL       [MAR Hold] heparin  5,000 Units Subcutaneous Q8H   [MAR Hold] hydrALAZINE  50 mg Oral TID   [MAR Hold] insulin aspart  0-6 Units Subcutaneous TID WC   [MAR Hold] insulin aspart  2 Units Subcutaneous TID WC   [MAR Hold] insulin glargine-yfgn  10 Units Subcutaneous QHS   [MAR Hold] multivitamin  1 tablet Oral QHS   [MAR Hold] mupirocin ointment  1 Application Nasal BID   [MAR Hold] pantoprazole  80 mg Oral Daily   povidone-iodine  2 Application Topical Once   povidone-iodine  2 Application Topical Once    [MAR Hold] sevelamer carbonate  1,600 mg Oral TID WC   [MAR Hold] sodium chloride flush  3 mL Intravenous Q12H    Dialysis Orders: [facility] MWF  4h  400/500  90kg  2/2 bath  P2   -Heparin 4000 units IV TIW  LUA AVF  Assessment/Plan: L hip infection - sp recent L hip surgery on 08/10/21. Now appears to have persistent infection. Went to OR 08/28/2021 for L hip debridement, wound vac placement. Had further debridement today ESRD - on HD MWF. Serial HD 06/28, 6/29 and 6/30. Volume status and Na are improved. Will plan for next HD Monday.  HTN/ vol - BP high on admission, now improved. She is now obelow her EDW after serial HD but suspect she may have lost some weight. Occasional soft BP here, follow trend and will adjust meds as needed.    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 Likely worsened from wound drainage. Getting ESA today. S/p PRBC. Holding heparin with HD MBD ckd - CCa in range, PO4 at goal.  Cont binders.  DM2 - per pmd   Anice Paganini, PA-C 08/31/2021, 10:33 AM  [facility] Pager: [phone]

## 2021-08-31 NOTE — Op Note (Signed)
08/31/2021  10:11 AM  PATIENT:  [name]    PRE-OPERATIVE DIAGNOSIS:  Wound Dehiscence Left Hip  POST-OPERATIVE DIAGNOSIS:  Same  PROCEDURE:  REPEAT DEBRIDEMENT LEFT HIP Application of Kerecis micro powder 95 cm. Local tissue rearrangement for wound closure 40 x 10 cm. Application of customizable wound VAC.  SURGEON:  Newt Minion, MD  PHYSICIAN ASSISTANT:None ANESTHESIA:   General  PREOPERATIVE INDICATIONS:  [name] is a  56 y.o. female with a diagnosis of Wound Dehiscence Left Hip who failed conservative measures and elected for surgical management.    The risks benefits and alternatives were discussed with the patient preoperatively including but not limited to the risks of infection, bleeding, nerve injury, cardiopulmonary complications, the need for revision surgery, among others, and the patient was willing to proceed.  OPERATIVE IMPLANTS: Kerecis micro powder 95 cm.  @ENCIMAGES @  OPERATIVE FINDINGS: Tissue margins were healthy and viable.  No purulence no necrotic tissue.  OPERATIVE PROCEDURE: Patient was brought the operating room and underwent a general anesthetic.  After adequate levels anesthesia were obtained patient's left lower extremity was prepped using DuraPrep draped into a sterile field a timeout was called.  The sutures and sponges were removed.  A rondure and a 21 blade knife was used to further debride necrotic muscle fascia and soft tissue.  The wound was irrigated with normal saline electrocautery was used hemostasis the remaining muscles did have good color and contractility.  The wound bed was filled with 95 cm of Kerecis micro powder.  Local tissue rearrangement was used to close the wound 40 x 10 cm.  A Praveena customizable wound VAC was applied this had a good suction fit patient was extubated taken the PACU in stable condition.  Debridement type: Excisional Debridement  Side: left  Body Location: hip   Tools used for debridement:  scalpel and rongeur  Pre-debridement Wound size (cm):   Length: 30        Width: 5     Depth: 3   Post-debridement Wound size (cm):   Length: 40        Width: 10     Depth: 5   Debridement depth beyond dead/damaged tissue down to healthy viable tissue: yes  Tissue layer involved: skin, subcutaneous tissue, muscle / fascia  Nature of tissue removed: Non-viable tissue  Irrigation volume: 3 liters     Irrigation fluid type: Normal Saline     DISCHARGE PLANNING:  Antibiotic duration: Continue antibiotics as per infectious disease  Weightbearing: Nonweightbearing left lower extremity  Pain medication: Opioid pathway  Dressing care/ Wound VAC: Continue wound VAC for discharge  Ambulatory devices: Transfers  Discharge to: Anticipate discharge back to skilled nursing.  Follow-up: In the office 1 week post operative.

## 2021-08-31 NOTE — Anesthesia Preprocedure Evaluation (Signed)
Anesthesia Evaluation  Patient identified by MRN, date of birth, ID band Patient awake    Reviewed: Allergy & Precautions, NPO status , Patient's Chart, lab work & pertinent test results  Airway Mallampati: II  TM Distance: >3 FB Neck ROM: Full    Dental  (+) Poor Dentition, Missing   Pulmonary Current Smoker and Patient abstained from smoking.,    Pulmonary exam normal        Cardiovascular hypertension, Pt. on medications and Pt. on home beta blockers Normal cardiovascular exam     Neuro/Psych  Headaches, PSYCHIATRIC DISORDERS Anxiety Depression CVA, No Residual Symptoms    GI/Hepatic Neg liver ROS, GERD  Medicated and Controlled,  Endo/Other  diabetes, Insulin Dependent  Renal/GU ESRF and DialysisRenal disease     Musculoskeletal  (+) Arthritis ,   Abdominal   Peds  Hematology  (+) Blood dyscrasia, anemia ,   Anesthesia Other Findings Wound Dehiscence Left Hip  Reproductive/Obstetrics                             Anesthesia Physical Anesthesia Plan  ASA: 3  Anesthesia Plan: General   Post-op Pain Management:    Induction: Intravenous  PONV Risk Score and Plan: 2 and Dexamethasone, Ondansetron and Treatment may vary due to age or medical condition  Airway Management Planned: LMA  Additional Equipment:   Intra-op Plan:   Post-operative Plan: Extubation in OR  Informed Consent: I have reviewed the patients History and Physical, chart, labs and discussed the procedure including the risks, benefits and alternatives for the proposed anesthesia with the patient or authorized representative who has indicated his/her understanding and acceptance.     Dental advisory given  Plan Discussed with: CRNA  Anesthesia Plan Comments:         Anesthesia Quick Evaluation

## 2021-08-31 NOTE — Anesthesia Procedure Notes (Signed)
Procedure Name: LMA Insertion Date/Time: 08/31/2021 9:15 AM  Performed by: Dorthea Cove, CRNAPre-anesthesia Checklist: Patient identified, Emergency Drugs available, Suction available and Patient being monitored Patient Re-evaluated:Patient Re-evaluated prior to induction Oxygen Delivery Method: Circle System Utilized Preoxygenation: Pre-oxygenation with 100% oxygen Induction Type: IV induction Ventilation: Mask ventilation without difficulty LMA: LMA inserted LMA Size: 4.0 Number of attempts: 1 Airway Equipment and Method: Bite block Placement Confirmation: positive ETCO2 Tube secured with: Tape Dental Injury: Teeth and Oropharynx as per pre-operative assessment

## 2021-08-31 NOTE — Transfer of Care (Signed)
Immediate Anesthesia Transfer of Care Note  Patient: [name]  Procedure(s) Performed: REPEAT DEBRIDEMENT LEFT HIP (Left: Hip)  Patient Location: PACU  Anesthesia Type:General  Level of Consciousness: awake, alert  and oriented  Airway & Oxygen Therapy: Patient Spontanous Breathing  Post-op Assessment: Report given to RN and Post -op Vital signs reviewed and stable  Post vital signs: Reviewed and stable  Last Vitals:  Vitals Value Taken Time  BP 128/61 08/31/21 1007  Temp    Pulse 70 08/31/21 1008  Resp 8 08/31/21 1008  SpO2 100 % 08/31/21 1008  Vitals shown include unvalidated device data.  Last Pain:  Vitals:   08/31/21 0827  TempSrc: Oral  PainSc:       Patients Stated Pain Goal: 2 (94/58/59 2924)  Complications: No notable events documented.

## 2021-08-31 NOTE — Anesthesia Postprocedure Evaluation (Signed)
Anesthesia Post Note  Patient: [name]  Procedure(s) Performed: REPEAT DEBRIDEMENT LEFT HIP (Left: Hip)     Patient location during evaluation: PACU Anesthesia Type: General Level of consciousness: awake Pain management: pain level controlled Vital Signs Assessment: post-procedure vital signs reviewed and stable Respiratory status: spontaneous breathing, nonlabored ventilation, respiratory function stable and patient connected to nasal cannula oxygen Cardiovascular status: blood pressure returned to baseline and stable Postop Assessment: no apparent nausea or vomiting Anesthetic complications: no   No notable events documented.  Last Vitals:  Vitals:   08/31/21 1020 08/31/21 1035  BP: (!) 163/62 (!) 165/59  Pulse: 71 75  Resp: 10 12  Temp:  (!) 36.2 C  SpO2: 97% 98%    Last Pain:  Vitals:   08/31/21 1151  TempSrc:   PainSc: Asleep                 Kristin Lamagna P Jersie Beel

## 2021-08-31 NOTE — Interval H&P Note (Signed)
History and Physical Interval Note:  08/31/2021 8:47 AM  [name]  has presented today for surgery, with the diagnosis of Wound Dehiscence Left Hip.  The various methods of treatment have been discussed with the patient and family. After consideration of risks, benefits and other options for treatment, the patient has consented to  Procedure(s): REPEAT DEBRIDEMENT LEFT HIP (Left) as a surgical intervention.  The patient's history has been reviewed, patient examined, no change in status, stable for surgery.  I have reviewed the patient's chart and labs.  Questions were answered to the patient's satisfaction.     Newt Minion

## 2021-08-31 NOTE — Progress Notes (Signed)
OT Cancellation Note  Patient Details Name: [name] MRN: [id number] DOB: [birth year]   Cancelled Treatment:    Reason Eval/Treat Not Completed: Patient at procedure or test/ unavailable.  Patient at OR for L hip procedure.    Finnigan Warriner D Kimmi Acocella 08/31/2021, 9:11 AM

## 2021-09-01 DIAGNOSIS — I10 Essential (primary) hypertension: Secondary | ICD-10-CM | POA: Diagnosis not present

## 2021-09-01 DIAGNOSIS — E785 Hyperlipidemia, unspecified: Secondary | ICD-10-CM

## 2021-09-01 DIAGNOSIS — M86252 Subacute osteomyelitis, left femur: Secondary | ICD-10-CM | POA: Diagnosis not present

## 2021-09-01 LAB — CBC
HCT: 22.6 % — ABNORMAL LOW (ref 36.0–46.0)
Hemoglobin: 7.2 g/dL — ABNORMAL LOW (ref 12.0–15.0)
MCH: 30 pg (ref 26.0–34.0)
MCHC: 31.9 g/dL (ref 30.0–36.0)
MCV: 94.2 fL (ref 80.0–100.0)
Platelets: 233 10*3/uL (ref 150–400)
RBC: 2.4 MIL/uL — ABNORMAL LOW (ref 3.87–5.11)
RDW: 16.8 % — ABNORMAL HIGH (ref 11.5–15.5)
WBC: 7.7 10*3/uL (ref 4.0–10.5)
nRBC: 0 % (ref 0.0–0.2)

## 2021-09-01 LAB — GLUCOSE, CAPILLARY
Glucose-Capillary: 410 mg/dL — ABNORMAL HIGH (ref 70–99)
Glucose-Capillary: 398 mg/dL — ABNORMAL HIGH (ref 70–99)
Glucose-Capillary: 270 mg/dL — ABNORMAL HIGH (ref 70–99)
Glucose-Capillary: 233 mg/dL — ABNORMAL HIGH (ref 70–99)

## 2021-09-01 MED ORDER — CALCIUM CARBONATE ANTACID 500 MG PO CHEW
1.0000 | CHEWABLE_TABLET | ORAL | Status: DC | PRN
  Administered 2021-09-01 – 2022-02-20 (×10): 200 mg via ORAL
  Filled 2021-09-01 (×10): qty 1

## 2021-09-01 MED ORDER — CHLORHEXIDINE GLUCONATE CLOTH 2 % EX PADS
6.0000 | MEDICATED_PAD | Freq: Every day | CUTANEOUS | Status: DC
  Administered 2021-09-01 – 2021-09-02 (×2): 6 via TOPICAL

## 2021-09-01 NOTE — Progress Notes (Signed)
Progress Note   Patient: [name] BZJ:[id number] DOB: [birth year] DOA: 08/26/2021     6 DOS: the patient was seen and examined on 09/01/2021   Brief hospital course: 56 y.o. female with past medical history significant for HTN, HLD, ESRD on HD MWF, history of left BKA 2021, depression/anxiety, history of CVA, tobacco use disorder, type 2 diabetes mellitus, insomnia, chronic pain syndrome, recent mechanical fall with left femur fracture deemed nonoperable by orthopedics and recent superficial abscess left hip s/p I&D 5/28 with partial resection left femur for osteomyelitis with soft tissue debridement who presents to [facility] ED on 6/26 with complaints of bloody drainage from her surgical incision site.  Patient reports some drainage occasionally, worse with any type of movement.  Given the drainage increasing EMS was called to evaluate.  Patient also complains of right knee pain, states twisted right knee a few days prior.  Denies fever/chills, no visual changes, no headache, no chest pain, no palpitations, no shortness of breath, no cough/congestion, no nausea/vomiting/diarrhea, no urinary symptoms.  Currently receiving cefazolin with HD per ID recommendations.   In the ED, temperature 97.7 F, HR 76, RR 17, BP 170/65, SPO2 97% on room air.  WBC 13.1, hemoglobin 8.7, platelets 269.  Sodium 133, potassium 4.4, chloride 93, CO2 25, glucose 191, BUN 41, creatinine 6.41.  Lactic acid 1.2.  CT left hip without contrast with comminuted ununited left intratrochanteric fracture, prior intramedullary nail and interlocking femoral neck screw which had been removed, soft tissue wound overlying greater trochanter with air extending from the skin surface to the defect in the proximal femoral diaphysis into the joint space, appearance concerning for infection, persistent partial osteolysis posterior lateral cortex of the proximal femoral diaphysis, no drainable fluid collection, soft tissue wound overlying lateral aspect  proximal femoral diaphysis with small bubble of air within the soft tissues which may be postsurgical versus secondary to soft tissue infection.  Orthopedics, Dr. [name] was consulted.  Hospital service consulted for further evaluation management for likely need of further operative management/I&D.  Assessment and Plan: Left femur osteomyelitis with abscess Patient presenting with increased drainage from left hip surgical site.  History of hospital-itis with abscess s/p hardware removal and partial resection of left femur and I&D abscess and soft tissue.  CT left hip on admission shows concern for infection within the soft tissues. --Orthopedics, Dr. [name] and infectious disease following, appreciate assistance --Continue cefazolin with HD --S/p I/d 6/28 with repeat debridement performed 7/1 --ID recs for continued daptomycin   Hx Fracture femoral condyle, left, closed History of left femoral hip fracture 07/2021 that was deemed inoperable by orthopedics. --Nonweightbearing left lower extremity   Right knee pain likely secondary to sprain Patient reports twisting her knee a few days prior.  X-ray right knee with soft tissue edema without osseous abnormality. --continue with analgesia as needed   ESRD on HD MWF --Nephrology following -cont on hd as scheduled   Type 2 diabetes mellitus Hemoglobin A1c 7.01 Jul 2021.  Home regimen includes Lantus 10 units copiously daily, NovoLog 10 units 3 times daily AC. --Currently Semglee at 10 units -On novolog 2 units with meals --Novolog SSI for coverage --Glucose peaked to 400's overnight. Pt observed to have carb-laden foods brought from home in her bed. Advised pt to avoid high carb foods Increase semglee to 15 units and aspart to 5 units   Essential hypertension --Amlodipine 5 mg p.o. daily --Carvedilol 6.25 mg p.o. twice daily --Hydralazine 50 mg p.o. TID   HLD:  Atorvastatin 40 mg p.o. daily   Anxiety/depression: --Cymbalta 60 mg p.o.  daily --Elavil 25 mg p.o. nightly   GERD: Continue PPI   Iron deficiency anemia with blood loss anemia Baseline hemoglobin around 9 (8.0 at most recent discharge).  Hemoglobin 8.7 on admission. --Transfuse for hemoglobin less than 7.0 -On 6/29 post op, hgb down to 6.5, 1 unit PRBC given --Hgb currently 7.2 -Cont to follow CBC trends      Subjective:  Without complaints at this time  Physical Exam: Vitals:   08/31/21 1635 08/31/21 2143 09/01/21 0609 09/01/21 0925  BP: (!) 133/50 (!) 147/52 (!) 146/55 (!) 155/67  Pulse: 76 73 72 76  Resp: 18  18 18   Temp: 98.7 F (37.1 C) 98.9 F (37.2 C) 98.2 F (36.8 C) 98.2 F (36.8 C)  TempSrc: Oral  Oral Oral  SpO2: 95% 97% 100% 100%  Weight:      Height:       General exam: Awake, laying in bed, in nad Respiratory system: Normal respiratory effort, no wheezing Cardiovascular system: regular rate, s1, s2 Gastrointestinal system: Soft, nondistended, positive BS Central nervous system: CN2-12 grossly intact, strength intact Extremities: Perfused, no clubbing Skin: Normal skin turgor, no notable skin lesions seen Psychiatry: Mood normal // no visual hallucinations   Data Reviewed:  Labs reviewed: Na 131, Hgb 7.2  Family Communication: Pt in room, family not at bedside  Disposition: Status is: Inpatient Remains inpatient appropriate because: Severity of illness  Planned Discharge Destination: Home    Author: Marylu Lund, MD 09/01/2021 2:41 PM  For on call review [email or web address].

## 2021-09-01 NOTE — Progress Notes (Signed)
Pharmacy Antibiotic Note  [name] is a 56 y.o. 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 Prior intra-op cultures in May grew MSSA, now with intra-op cultures this admission growing Enterococcus faecium - awaiting sensitivities. Pharmacy has been consulted for Daptomycin dosing.  Noted pt is ESRD and received serial HD 6/28-6/29 with plans to get back on schedule 6/30. Will dose q48h through the weekend and then plan to adjust to dosing on dialysis days if continued based on sensitivities.   Plan: - Daptomycin 900 mg (10 mg/kg TBW) IV every 48 hours - If continued - will consider transition to dosing on HD days this week - Will continue to follow HD schedule/duration, culture results, LOT, and antibiotic de-escalation plans   Height: 5' 10.98" (180.3 cm) Weight: 89.8 kg (197 lb 15.6 oz) IBW/kg (Calculated) : 70.76  Temp (24hrs), Avg:98 F (36.7 C), Min:97 F (36.1 C), Max:98.9 F (37.2 C)  Recent Labs  Lab 08/26/21 1225 08/27/21 0512 08/28/21 0349 08/29/21 0802 08/30/21 0645 08/31/21 0351 09/01/21 0142  WBC 13.1* 10.7* 8.6 8.0 9.7 9.0 7.7  CREATININE 6.41* 6.81* 4.32*  --  6.06* 4.34*  --   LATICACIDVEN 1.2  --   --   --   --   --   --      Estimated Creatinine Clearance: 18.3 mL/min (A) (by C-G formula based on SCr of 4.34 mg/dL (H)).    Allergies  Allergen Reactions   Trazodone Swelling   Latex Rash   Lidocaine Itching    Thank you for allowing pharmacy to be a part of this patient's care.  Sherlon Handing, PharmD, BCPS Please see amion for complete clinical pharmacist phone list 09/01/2021 9:37 AM

## 2021-09-01 NOTE — Progress Notes (Signed)
[facility] Progress Note   Subjective:   Reports pain is controlled today. Denis SOB, CP, palpitations, dizziness and nausea. No new concerns.   Objective Vitals:   08/31/21 1635 08/31/21 2143 09/01/21 0609 09/01/21 0925  BP: (!) 133/50 (!) 147/52 (!) 146/55 (!) 155/67  Pulse: 76 73 72 76  Resp: 18  18 18   Temp: 98.7 F (37.1 C) 98.9 F (37.2 C) 98.2 F (36.8 C) 98.2 F (36.8 C)  TempSrc: Oral  Oral Oral  SpO2: 95% 97% 100% 100%  Weight:      Height:       Physical Exam General: [name] Heart: RRR, no murmurs, rubs or gallops Lungs: CTA bilaterally without wheezing, rhonchi or rales Abdomen: Soft, non-distended, +BS Extremities: 1-2+ pitting edema RLE Dialysis Access: AVF + bruit  Additional Objective Labs: Basic Metabolic Panel: Recent Labs  Lab 08/28/21 0349 08/30/21 0645 08/31/21 0351  NA 132* 129* 131*  K 3.5 3.7 3.9  CL 92* 91* 92*  CO2 28 26 28   GLUCOSE 208* 243* 276*  BUN 23* 37* 29*  CREATININE 4.32* 6.06* 4.34*  CALCIUM 7.8* 7.8* 7.8*  PHOS 3.5  --   --    Liver Function Tests: Recent Labs  Lab 08/28/21 0349 08/30/21 0645 08/31/21 0351  AST  --  11* 9*  ALT  --  <5 <5  ALKPHOS  --  136* 121  BILITOT  --  0.7 0.6  PROT  --  6.2* 5.5*  ALBUMIN 2.1* 2.2* 1.9*   No results for input(s): "LIPASE", "AMYLASE" in the last 168 hours. CBC: Recent Labs  Lab 08/26/21 1225 08/27/21 0512 08/28/21 0349 08/29/21 0802 08/29/21 2010 08/30/21 0645 08/31/21 0351 09/01/21 0142  WBC 13.1*   < > 8.6 8.0  --  9.7 9.0 7.7  NEUTROABS 10.2*  --   --   --   --   --   --   --   HGB 8.7*   < > 9.3* 6.5*   < > 8.0* 8.3* 7.2*  HCT 29.3*   < > 28.8* 20.8*   < > 25.6* 26.1* 22.6*  MCV 98.0   < > 93.8 95.9  --  92.8 93.9 94.2  PLT 269   < > 234 217  --  239 212 233   < > = values in this interval not displayed.   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 Performed at [facility], [place] [address], [place], [place] [address]    REPTSTATUS PENDING 08/28/2021 1829    Cardiac Enzymes: Recent Labs  Lab 08/30/21 0645  CKTOTAL 21*   CBG: Recent Labs  Lab 08/31/21 1019 08/31/21 1145 08/31/21 1658 08/31/21 2145 09/01/21 0720  GLUCAP 174* 167* 355* 442* 410*   Iron Studies: No results for input(s): "IRON", "TIBC", "TRANSFERRIN", "FERRITIN" in the last 72 hours. @lablastinr3 @ Studies/Results: No results found. Medications:  sodium chloride     sodium chloride Stopped (08/29/21 0139)   sodium chloride     DAPTOmycin (CUBICIN) 900 mg in sodium chloride 0.9 % IVPB Stopped (08/31/21 2040)   methocarbamol (ROBAXIN) IV      alosetron  1 mg Oral BID   amitriptyline  25  mg Oral QHS   amLODipine  5 mg Oral Daily   atorvastatin  40 mg Oral q1800   carvedilol  6.25 mg Oral BID   Chlorhexidine Gluconate Cloth  6 each Topical Q0600   Chlorhexidine Gluconate Cloth  6 each Topical Daily   cyclobenzaprine  10 mg Oral TID   darbepoetin (ARANESP) injection - DIALYSIS  60 mcg Intravenous Q Fri-HD   docusate sodium  100 mg Oral BID   DULoxetine  60 mg Oral Daily   heparin  5,000 Units Subcutaneous Q8H   hydrALAZINE  50 mg Oral TID   insulin aspart  0-6 Units Subcutaneous TID WC   insulin aspart  2 Units Subcutaneous TID WC   insulin glargine-yfgn  10 Units Subcutaneous QHS   multivitamin  1 tablet Oral QHS   pantoprazole  80 mg Oral Daily   sevelamer carbonate  1,600 mg Oral TID WC   sodium chloride flush  3 mL Intravenous Q12H    Dialysis Orders: [facility] MWF  4h  400/500  90kg  2/2 bath  P2   -Heparin 4000 units IV TIW  LUA AVF  Assessment/Plan: L hip infection - sp recent L hip surgery on 08/10/21. Now appears to have  persistent infection. Went to OR 08/28/2021 and 08/31/21 for L hip debridement ESRD - on HD MWF. Serial HD 06/28, 6/29 and 6/30. Volume status and Na are improved but still with edema. Will plan for next HD Monday.  HTN/ vol - BP high on admission, now improved. She is now below her EDW after serial HD but suspect she likely lost some weight. Occasional soft BP here, follow trend and will adjust meds as needed.    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 S/p PRBC. Holding heparin with HD. Continue aranesp q friday MBD ckd - CCa in range, PO4 at goal.  Cont binders.  DM2 - per pmd   Anice Paganini, PA-C 09/01/2021, 10:21 AM  [facility] Pager: [phone]

## 2021-09-01 NOTE — Evaluation (Signed)
Occupational Therapy Evaluation Patient Details Name: [name] MRN: [id number] DOB: April 12, 1965 Today's Date: 09/01/2021   History of Present Illness 56 y.o. female presented to ED 6/26 from dialysis with increased bloody drainage from L hip wound. 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 s/p hardware removal of left hip with partial resection of tuft tissue and femure that were nonviable on 08/10/21 Discharged home 6/23. underwent L hip debridement and wound vac placement 6/28 PMH: hypertension, hyperlipidemia, ESRD on HD MWF, history of left BKA in 2021, depression/anxiety, stroke, tobacco use, T2DM,  insomnia, chronic pain syndrome,   Clinical Impression   PTA, pt independent with w/c transfers and ADLs, reports receiving set up A for ADLs as pt's w/c does not fit in bathroom. Lives with daughter who provides transportation assist. Pt currently anxious regarding mobility, and afraid of reopening her wound. Pt agreeable to bed level mobility only at this time, stating "I'm not ready for that" when asked to sit EOB. Pt set up -modA for ADLs, and able to sit up at [facility] 55*, requires physical assist to move LLE for pillow placement. Pt wanting to return home, discussed how pt was going to transfer/perform ADLs if she is fearful of mobility, pt states she will just do what she did before admission. Pt with poor insight to deficits and impairments listed below, will follow acutely. Recommending SNF at d/c pending pt progress.      Recommendations for follow up therapy are one component of a multi-disciplinary discharge planning process, led by the attending physician.  Recommendations may be updated based on patient status, additional functional criteria and insurance authorization.   Follow Up Recommendations  Skilled nursing-short term rehab (<3 hours/day)    Assistance Recommended at Discharge Intermittent Supervision/Assistance  Patient can return home with the  following A lot of help with walking and/or transfers;A lot of help with bathing/dressing/bathroom;Help with stairs or ramp for entrance;Assistance with cooking/housework    Functional Status Assessment  Patient has had a recent decline in their functional status and demonstrates the ability to make significant improvements in function in a reasonable and predictable amount of time.  Equipment Recommendations  None recommended by OT    Recommendations for Other Services PT consult     Precautions / Restrictions Precautions Precautions: Fall;Other (comment) Precaution Comments: L BKA (baseline), wound vac Restrictions Weight Bearing Restrictions: Yes LLE Weight Bearing: Non weight bearing Other Position/Activity Restrictions: pt also with chronic L BKA/infection      Mobility Bed Mobility Overal bed mobility: Needs Assistance             General bed mobility comments: can sit with HOB 55* for eating, however refusing sitting EOB stating she is "not ready for that yet", states she is able to roll R/L with assist from NT staff but refuses to demo at time of evaluation    Transfers                   General transfer comment: pt declining      Balance Overall balance assessment: Needs assistance Sitting-balance support: Feet supported Sitting balance-Leahy Scale: Fair Sitting balance - Comments: can long sit in bed                                   ADL either performed or assessed with clinical judgement   ADL Overall ADL's : Needs assistance/impaired Eating/Feeding: Set  up;Bed level   Grooming: Set up;Bed level   Upper Body Bathing: Set up;Sitting   Lower Body Bathing: Moderate assistance;Sitting/lateral leans   Upper Body Dressing : Set up;Sitting   Lower Body Dressing: Moderate assistance;Sitting/lateral leans   Toilet Transfer: Moderate assistance;+2 for physical assistance   Toileting- Clothing Manipulation and Hygiene: Maximal  assistance       Functional mobility during ADLs: Moderate assistance;+2 for physical assistance       Vision Baseline Vision/History: 1 Wears glasses Vision Assessment?: No apparent visual deficits     Perception     Praxis      Pertinent Vitals/Pain Pain Assessment Pain Assessment: Faces Pain Score: 2  Faces Pain Scale: Hurts a little bit Pain Location: LLE Pain Descriptors / Indicators: Discomfort, Sore Pain Intervention(s): Monitored during session, Limited activity within patient's tolerance, Repositioned, Premedicated before session     Hand Dominance     Extremity/Trunk Assessment Upper Extremity Assessment Upper Extremity Assessment: Overall WFL for tasks assessed   Lower Extremity Assessment Lower Extremity Assessment: Defer to PT evaluation   Cervical / Trunk Assessment Cervical / Trunk Assessment: Normal   Communication Communication Communication: No difficulties   Cognition Arousal/Alertness: Awake/alert Behavior During Therapy: WFL for tasks assessed/performed, Anxious Overall Cognitive Status: Within Functional Limits for tasks assessed                                 General Comments: A&Ox4, requires encouragement to participate, appears to have poor understanding of need to participate to regain independence     General Comments  VSS on RA    Exercises     Shoulder Instructions      Home Living Family/patient expects to be discharged to:: Private residence Living Arrangements: Children Available Help at Discharge: Family;Available PRN/intermittently Type of Home: Mobile home Home Access: Ramped entrance     Home Layout: One level     Bathroom Shower/Tub: Occupational psychologist: Standard Bathroom Accessibility: No   Home Equipment: Tax adviser (4 wheels)          Prior Functioning/Environment Prior Level of Function : Needs assist             Mobility Comments:  independent with squat pivot transfers to wheelchair, mod I with wheelchair mobility ADLs Comments: daughter provides set up assistance for bathing, indpendent with transfers to [facility] and self dressing, iADLs provided by family        OT Problem List: Decreased strength;Pain;Decreased range of motion;Decreased activity tolerance;Decreased safety awareness;Impaired balance (sitting and/or standing);Decreased knowledge of use of DME or AE;Decreased knowledge of precautions      OT Treatment/Interventions: Self-care/ADL training;Balance training;Therapeutic exercise;Energy conservation;Therapeutic activities;DME and/or AE instruction;Visual/perceptual remediation/compensation;Patient/family education    OT Goals(Current goals can be found in the care plan section) Acute Rehab OT Goals Patient Stated Goal: to get better OT Goal Formulation: With patient Time For Goal Achievement: 09/15/21 Potential to Achieve Goals: Fair  OT Frequency: Min 2X/week    Co-evaluation              AM-PAC OT "6 Clicks" Daily Activity     Outcome Measure Help from another person eating meals?: None Help from another person taking care of personal grooming?: A Little Help from another person toileting, which includes using toliet, bedpan, or urinal?: A Lot Help from another person bathing (including washing, rinsing, drying)?: A Lot Help from another person to put on and  taking off regular upper body clothing?: A Little Help from another person to put on and taking off regular lower body clothing?: A Lot 6 Click Score: 16   End of Session Nurse Communication: Mobility status  Activity Tolerance: Other (comment) (pt self limiting) Patient left: in bed;with call bell/phone within reach;with bed alarm set  OT Visit Diagnosis: Muscle weakness (generalized) (M62.81);Unsteadiness on feet (R26.81);History of falling (Z91.81)                Time: 6440-3474 OT Time Calculation (min): 20 min Charges:  OT General  Charges $OT Visit: 1 Visit OT Evaluation $OT Eval Low Complexity: 1 Low  Lynnda Child, OTD, OTR/L Acute Rehab [phone]) [phone] - [place] 09/01/2021, 12:36 PM

## 2021-09-01 NOTE — Progress Notes (Signed)
Pt c/o heartburn. RN messaged MD on call to see if we can have an order for Tums or something to help it.   [name] [name]

## 2021-09-02 ENCOUNTER — Encounter (HOSPITAL_COMMUNITY): Payer: Self-pay | Admitting: Orthopedic Surgery

## 2021-09-02 DIAGNOSIS — F32A Depression, unspecified: Secondary | ICD-10-CM | POA: Diagnosis not present

## 2021-09-02 DIAGNOSIS — M86252 Subacute osteomyelitis, left femur: Secondary | ICD-10-CM | POA: Diagnosis not present

## 2021-09-02 DIAGNOSIS — I10 Essential (primary) hypertension: Secondary | ICD-10-CM | POA: Diagnosis not present

## 2021-09-02 DIAGNOSIS — F419 Anxiety disorder, unspecified: Secondary | ICD-10-CM | POA: Diagnosis not present

## 2021-09-02 LAB — CBC
HCT: 24.9 % — ABNORMAL LOW (ref 36.0–46.0)
Hemoglobin: 7.8 g/dL — ABNORMAL LOW (ref 12.0–15.0)
MCH: 30.2 pg (ref 26.0–34.0)
MCHC: 31.3 g/dL (ref 30.0–36.0)
MCV: 96.5 fL (ref 80.0–100.0)
Platelets: 308 10*3/uL (ref 150–400)
RBC: 2.58 MIL/uL — ABNORMAL LOW (ref 3.87–5.11)
RDW: 17.4 % — ABNORMAL HIGH (ref 11.5–15.5)
WBC: 10.9 10*3/uL — ABNORMAL HIGH (ref 4.0–10.5)
nRBC: 0 % (ref 0.0–0.2)

## 2021-09-02 LAB — COMPREHENSIVE METABOLIC PANEL
ALT: <5 U/L (ref 0–44)
AST: 13 U/L — ABNORMAL LOW (ref 15–41)
Albumin: 2.3 g/dL — ABNORMAL LOW (ref 3.5–5.0)
Alkaline Phosphatase: 163 U/L — ABNORMAL HIGH (ref 38–126)
Anion gap: 11 (ref 5–15)
BUN: 52 mg/dL — ABNORMAL HIGH (ref 6–20)
CO2: 28 mmol/L (ref 22–32)
Calcium: 8.6 mg/dL — ABNORMAL LOW (ref 8.9–10.3)
Chloride: 88 mmol/L — ABNORMAL LOW (ref 98–111)
Creatinine, Ser: 6.67 mg/dL — ABNORMAL HIGH (ref 0.44–1.00)
GFR, Estimated: 7 mL/min — ABNORMAL LOW (ref >60)
Glucose, Bld: 189 mg/dL — ABNORMAL HIGH (ref 70–99)
Potassium: 4.9 mmol/L (ref 3.5–5.1)
Sodium: 127 mmol/L — ABNORMAL LOW (ref 135–145)
Total Bilirubin: 0.5 mg/dL (ref 0.3–1.2)
Total Protein: 6.1 g/dL — ABNORMAL LOW (ref 6.5–8.1)

## 2021-09-02 LAB — GLUCOSE, CAPILLARY
Glucose-Capillary: 190 mg/dL — ABNORMAL HIGH (ref 70–99)
Glucose-Capillary: 328 mg/dL — ABNORMAL HIGH (ref 70–99)
Glucose-Capillary: 272 mg/dL — ABNORMAL HIGH (ref 70–99)

## 2021-09-02 LAB — AEROBIC/ANAEROBIC CULTURE W GRAM STAIN (SURGICAL/DEEP WOUND)

## 2021-09-02 MED ORDER — INSULIN ASPART 100 UNIT/ML IJ SOLN
5.0000 [IU] | Freq: Three times a day (TID) | INTRAMUSCULAR | Status: DC
Start: 2021-09-02 — End: 2021-10-27
  Administered 2021-09-02 – 2021-10-27 (×120): 5 [IU] via SUBCUTANEOUS

## 2021-09-02 MED ORDER — INSULIN GLARGINE-YFGN 100 UNIT/ML ~~LOC~~ SOLN
12.0000 [IU] | Freq: Every day | SUBCUTANEOUS | Status: DC
  Administered 2021-09-02 – 2021-10-25 (×54): 12 [IU] via SUBCUTANEOUS
  Filled 2021-09-02 (×67): qty 0.12

## 2021-09-02 MED ORDER — PENTAFLUOROPROP-TETRAFLUOROETH EX AERO: 1.0000 | INHALATION_SPRAY | CUTANEOUS | Status: DC | PRN

## 2021-09-02 MED ORDER — SODIUM CHLORIDE 0.9 % IV SOLN
10.0000 mg/kg | INTRAVENOUS | Status: DC
  Administered 2021-09-02 – 2021-09-27 (×12): 900 mg via INTRAVENOUS
  Filled 2021-09-02 (×17): qty 18

## 2021-09-02 NOTE — Progress Notes (Signed)
[facility] Progress Note   Subjective:  Seen at onset of HD, 4L UFG. No CP/dyspnea, still with leg pain and wound vac to L hip in place.   Objective Vitals:   09/02/21 0839 09/02/21 0900 09/02/21 0930 09/02/21 1000  BP: (!) 142/56 (!) 148/57 (!) 128/56 129/65  Pulse: 61 65 73 74  Resp: 13 (!) 8 (!) 9 10  Temp: 97.8 F (36.6 C)     TempSrc: Oral     SpO2:  96% 95% 98%  Weight:      Height:       Physical Exam General: Well appearing woman, NAD. Room air. Heart: RRR; no murmur Lungs: CTA anteriorly Abdomen:soft, non-tender Extremities: RLE with 1+ edema; L BKA with 1+ stump edema. Wound vac to L hip area Dialysis Access: L AVF + bruit  Additional Objective Labs: Basic Metabolic Panel: Recent Labs  Lab 08/28/21 0349 08/30/21 0645 08/31/21 0351 09/02/21 0745  NA 132* 129* 131* 127*  K 3.5 3.7 3.9 4.9  CL 92* 91* 92* 88*  CO2 28 26 28 28   GLUCOSE 208* 243* 276* 189*  BUN 23* 37* 29* 52*  CREATININE 4.32* 6.06* 4.34* 6.67*  CALCIUM 7.8* 7.8* 7.8* 8.6*  PHOS 3.5  --   --   --    Liver Function Tests: Recent Labs  Lab 08/30/21 0645 08/31/21 0351 09/02/21 0745  AST 11* 9* 13*  ALT <5 <5 <5  ALKPHOS 136* 121 163*  BILITOT 0.7 0.6 0.5  PROT 6.2* 5.5* 6.1*  ALBUMIN 2.2* 1.9* 2.3*   CBC: Recent Labs  Lab 08/26/21 1225 08/27/21 0512 08/29/21 0802 08/29/21 2010 08/30/21 0645 08/31/21 0351 09/01/21 0142 09/02/21 0745  WBC 13.1*   < > 8.0  --  9.7 9.0 7.7 10.9*  NEUTROABS 10.2*  --   --   --   --   --   --   --   HGB 8.7*   < > 6.5*   < > 8.0* 8.3* 7.2* 7.8*  HCT 29.3*   < > 20.8*   < > 25.6* 26.1* 22.6* 24.9*  MCV 98.0   < > 95.9  --  92.8 93.9 94.2 96.5  PLT 269   < > 217  --  239 212 233 308   < > = values in this interval not displayed.   Blood Culture    Component Value Date/Time   SDES TISSUE 08/28/2021 0917   SPECREQUEST LEFT THIGH TISSUE 08/28/2021 0917   CULT  08/28/2021 0917    MODERATE ENTEROCOCCUS FAECIUM VANCOMYCIN  RESISTANT ENTEROCOCCUS ISOLATED NO ANAEROBES ISOLATED; CULTURE IN PROGRESS FOR 5 DAYS Sent to [place] for further susceptibility testing. Performed at [facility], [place] [address], [place], [place] [address]    REPTSTATUS PENDING 08/28/2021 6045   Medications:  sodium chloride     sodium chloride Stopped (08/29/21 0139)   sodium chloride     DAPTOmycin (CUBICIN) 900 mg in sodium chloride 0.9 % IVPB     methocarbamol (ROBAXIN) IV      alosetron  1 mg Oral BID   amitriptyline  25 mg Oral QHS   amLODipine  5 mg Oral Daily   atorvastatin  40 mg Oral q1800   carvedilol  6.25 mg Oral BID   cyclobenzaprine  10 mg Oral TID   darbepoetin (ARANESP) injection - DIALYSIS  60 mcg Intravenous Q Fri-HD   docusate sodium  100 mg Oral BID   DULoxetine  60 mg Oral Daily  heparin  5,000 Units Subcutaneous Q8H   hydrALAZINE  50 mg Oral TID   insulin aspart  0-6 Units Subcutaneous TID WC   insulin aspart  2 Units Subcutaneous TID WC   insulin glargine-yfgn  10 Units Subcutaneous QHS   multivitamin  1 tablet Oral QHS   pantoprazole  80 mg Oral Daily   sevelamer carbonate  1,600 mg Oral TID WC   sodium chloride flush  3 mL Intravenous Q12H    Dialysis Orders: [facility] MWF 4h  400/500  90kg  2/2 bath  P2  Heparin 4000 units IV TIW  LUA AVF   Assessment/Plan: L hip infection: S/p recent L hip surgery on 08/10/21. Now appears to have persistent infection. Went to OR 08/28/2021 and 08/31/21 for L hip debridement, wound vac in place. ESRD - on HD MWF. Serial HD 06/28, 6/29 and 6/30. Volume status and Na are improved but still with edema. HD today, 4L UFG. HTN/ vol - BP high on admission, now improved. She is now below her EDW after serial HD but suspect she likely lost some weight. Occasional soft BP here, follow trend and will adjust meds as needed.    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 S/p PRBC. Holding heparin with HD. Continue Aranesp q Friday, ^ next dose. Secondary HPTH: CCa in range,  PO4 at goal.  Cont binders.  DM2 - per pmd     Veneta Penton, PA-C 09/02/2021, 10:25 AM  [facility]

## 2021-09-02 NOTE — Plan of Care (Signed)
  Problem: Coping: Goal: Ability to adjust to condition or change in health will improve Outcome: Progressing   Problem: Fluid Volume: Goal: Ability to maintain a balanced intake and output will improve Outcome: Progressing   Problem: Health Behavior/Discharge Planning: Goal: Ability to manage health-related needs will improve Outcome: Progressing   

## 2021-09-02 NOTE — TOC Progression Note (Signed)
Transition of Care [facility]) - Progression Note    Patient Details  Name: [name] MRN: [id number] Date of Birth: 1965/12/05  [facility]) CM/SW Tobias, LCSW Phone Number: 09/02/2021, 8:46 AM  Clinical Narrative:    TOC continuing to follow for discharge needs.      Barriers to Discharge: Continued Medical Work up  Expected Discharge Plan and Services     Discharge Planning Services: CM Consult   Living arrangements for the past 2 months: Single Family Home                                       Social Determinants of Health (SDOH) Interventions    Readmission Risk Interventions    07/26/2021    3:15 PM  Readmission Risk Prevention Plan  Transportation Screening Complete  PCP or Specialist Appt within 3-5 Days Complete  HRI or Home Care Consult Complete  Palliative Care Screening Not Applicable  Medication Review (RN Care Manager) Referral to Pharmacy

## 2021-09-02 NOTE — Progress Notes (Signed)
Physical Therapy Treatment Patient Details Name: [name] MRN: [id number] DOB: [birth year] Today's Date: 09/02/2021   History of Present Illness 56 y.o. female presented to ED 6/26 from dialysis with increased bloody drainage from L hip wound. 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 s/p hardware removal of left hip with partial resection of tuft tissue and femure that were nonviable on 08/10/21 Discharged home 6/23. underwent L hip debridement and wound vac placement 6/28 PMH: hypertension, hyperlipidemia, ESRD on HD MWF, history of left BKA in 2021, depression/anxiety, stroke, tobacco use, T2DM,  insomnia, chronic pain syndrome,    PT Comments    Pt continues to refuse bed mobility until wound vac is discontinued. Pt reporting increased fear that wound will dehisce and start bleeding again. Pt agreeable to bed being placed in chair position for therapeutic exercise. Tried to encourage leaving bed in chair position so she could move her R LE. Pt request to return to supine. D/c plan remains appropriate at this time. PT will continue to follow acutely.   Recommendations for follow up therapy are one component of a multi-disciplinary discharge planning process, led by the attending physician.  Recommendations may be updated based on patient status, additional functional criteria and insurance authorization.  Follow Up Recommendations  Skilled nursing-short term rehab (<3 hours/day) Can patient physically be transported by private vehicle: Yes   Assistance Recommended at Discharge Frequent or constant Supervision/Assistance  Patient can return home with the following A lot of help with walking and/or transfers;A lot of help with bathing/dressing/bathroom;Assistance with cooking/housework;Direct supervision/assist for medications management;Direct supervision/assist for financial management;Assist for transportation;Help with stairs or ramp for entrance    Equipment Recommendations  None recommended by PT    Recommendations for Other Services OT consult     Precautions / Restrictions Precautions Precautions: Fall;Other (comment) Precaution Comments: L BKA (baseline), increased drainage from LLE wound Restrictions Weight Bearing Restrictions: Yes LLE Weight Bearing: Non weight bearing Other Position/Activity Restrictions: L knee fx     Mobility  Bed Mobility               General bed mobility comments: refuses bed mobility as refuses, stating Dr [name] does not want any mobility until after surgery on Friday     Ambulation/Gait               General Gait Details: non ambulatory at baseline         Cognition Arousal/Alertness: Awake/alert Behavior During Therapy: [facility] for tasks assessed/performed Overall Cognitive Status: Within Functional Limits for tasks assessed                                 General Comments: A&Ox4,continues to refuse bed mobility until wound vac is taken off, educated on need to move        Exercises Total Joint Exercises Ankle Circles/Pumps: Right, AROM, Both, 10 reps Quad Sets: Strengthening, Right, 10 reps Gluteal Sets: Strengthening, Both, 10 reps Heel Slides: Strengthening, Right, 10 reps, Seated Hip ABduction/ADduction: Right, 10 reps, AROM Other Exercises Other Exercises: pull back off of bed with bed in chair position with 5 sec hold x 10 Other Exercises: reaching to contralateral rail with bed in chair position    General Comments General comments (skin integrity, edema, etc.): VSS on RA      Pertinent Vitals/Pain Pain Assessment Breathing: normal Negative Vocalization: none Facial Expression: smiling or inexpressive Body Language: relaxed  Consolability: no need to console PAINAD Score: 0 Pain Location: L hip at drain site Pain Descriptors / Indicators: Discomfort, Sore Pain Intervention(s): Monitored during session, Limited activity within patient's  tolerance, Repositioned     PT Goals (current goals can now be found in the care plan section) Acute Rehab PT Goals Patient Stated Goal: less pain with movement PT Goal Formulation: With patient Time For Goal Achievement: 09/10/21 Potential to Achieve Goals: Fair Progress towards PT goals: Not progressing toward goals - comment (refuses mobility o)    Frequency    Min 3X/week      PT Plan Current plan remains appropriate       AM-PAC PT "6 Clicks" Mobility   Outcome Measure  Help needed turning from your back to your side while in a flat bed without using bedrails?: A Little Help needed moving from lying on your back to sitting on the side of a flat bed without using bedrails?: A Little Help needed moving to and from a bed to a chair (including a wheelchair)?: Total Help needed standing up from a chair using your arms (e.g., wheelchair or bedside chair)?: Total Help needed to walk in hospital room?: Total Help needed climbing 3-5 steps with a railing? : Total 6 Click Score: 10    End of Session Equipment Utilized During Treatment: Gait belt Activity Tolerance: Patient tolerated treatment well Patient left: in bed;with call bell/phone within reach;with bed alarm set Nurse Communication: Mobility status PT Visit Diagnosis: Other abnormalities of gait and mobility (R26.89);Muscle weakness (generalized) (M62.81);History of falling (Z91.81);Pain Pain - Right/Left: Left Pain - part of body: Leg     Time: 5003-7048 PT Time Calculation (min) (ACUTE ONLY): 16 min  Charges:  $Therapeutic Exercise: 8-22 mins                     Eliza Green B. [name] PT, DPT Acute Rehabilitation Services Please use secure chat or  Call Office [phone]    [place] 09/02/2021, 3:09 PM

## 2021-09-02 NOTE — Progress Notes (Signed)
Progress Note   Patient: [name] SHF:[id number] DOB: [birth year] DOA: 08/26/2021     7 DOS: the patient was seen and examined on 09/02/2021   Brief hospital course: 56 y.o. female with past medical history significant for HTN, HLD, ESRD on HD MWF, history of left BKA 2021, depression/anxiety, history of CVA, tobacco use disorder, type 2 diabetes mellitus, insomnia, chronic pain syndrome, recent mechanical fall with left femur fracture deemed nonoperable by orthopedics and recent superficial abscess left hip s/p I&D 5/28 with partial resection left femur for osteomyelitis with soft tissue debridement who presents to [facility] ED on 6/26 with complaints of bloody drainage from her surgical incision site.  Patient reports some drainage occasionally, worse with any type of movement.  Given the drainage increasing EMS was called to evaluate.  Patient also complains of right knee pain, states twisted right knee a few days prior.  Denies fever/chills, no visual changes, no headache, no chest pain, no palpitations, no shortness of breath, no cough/congestion, no nausea/vomiting/diarrhea, no urinary symptoms.  Currently receiving cefazolin with HD per ID recommendations.   In the ED, temperature 97.7 F, HR 76, RR 17, BP 170/65, SPO2 97% on room air.  WBC 13.1, hemoglobin 8.7, platelets 269.  Sodium 133, potassium 4.4, chloride 93, CO2 25, glucose 191, BUN 41, creatinine 6.41.  Lactic acid 1.2.  CT left hip without contrast with comminuted ununited left intratrochanteric fracture, prior intramedullary nail and interlocking femoral neck screw which had been removed, soft tissue wound overlying greater trochanter with air extending from the skin surface to the defect in the proximal femoral diaphysis into the joint space, appearance concerning for infection, persistent partial osteolysis posterior lateral cortex of the proximal femoral diaphysis, no drainable fluid collection, soft tissue wound overlying lateral aspect  proximal femoral diaphysis with small bubble of air within the soft tissues which may be postsurgical versus secondary to soft tissue infection.  Orthopedics, Dr. [name] was consulted.  Hospital service consulted for further evaluation management for likely need of further operative management/I&D.  Assessment and Plan: Left femur osteomyelitis with abscess Patient presenting with increased drainage from left hip surgical site.  History of hospital-itis with abscess s/p hardware removal and partial resection of left femur and I&D abscess and soft tissue.  CT left hip on admission shows concern for infection within the soft tissues. --Orthopedics, Dr. [name] and infectious disease following, appreciate assistance --Continue cefazolin with HD --S/p I/d 6/28 with repeat debridement performed 7/1 --Cont daptomycin per ID recs   Hx Fracture femoral condyle, left, closed History of left femoral hip fracture 07/2021 that was deemed inoperable by orthopedics. --Nonweightbearing left lower extremity   Right knee pain likely secondary to sprain Patient reports twisting her knee a few days prior.  X-ray right knee with soft tissue edema without osseous abnormality. --continue with analgesia as needed   ESRD on HD MWF --Nephrology following -cont on hd as scheduled, seen on HD today   Type 2 diabetes mellitus Hemoglobin A1c 7.01 Jul 2021.  Home regimen includes Lantus 10 units copiously daily, NovoLog 10 units 3 times daily AC. --Currently Semglee at 10 units -On novolog 2 units with meals --Novolog SSI for coverage --Glucose recently peaked to 400's likely secondary to eating high carb foods -Increase semglee to 12 units and aspart to 5 units   Essential hypertension --Amlodipine 5 mg p.o. daily --Carvedilol 6.25 mg p.o. twice daily --Hydralazine 50 mg p.o. TID   HLD: Atorvastatin 40 mg p.o. daily   Anxiety/depression: --  Cymbalta 60 mg p.o. daily --Elavil 25 mg p.o. nightly   GERD: Continue  PPI   Iron deficiency anemia with blood loss anemia Baseline hemoglobin around 9 (8.0 at most recent discharge).  Hemoglobin 8.7 on admission. --Transfuse for hemoglobin less than 7.0 -On 6/29 post op, hgb down to 6.5, 1 unit PRBC given --Hgb currently 7.2 -Cont to follow CBC trends  Hyponatremia -cont volume management on HD      Subjective:  Seen on HD. Without complaints  Physical Exam: Vitals:   09/02/21 1100 09/02/21 1130 09/02/21 1200 09/02/21 1229  BP: 110/68 (!) 98/48 121/83 (!) 120/47  Pulse: 74 71 71 67  Resp: 12 11 (!) 8 12  Temp:    98.1 F (36.7 C)  TempSrc:      SpO2: 95%   92%  Weight:      Height:       General exam: Conversant, in no acute distress Respiratory system: normal chest rise, clear, no audible wheezing Cardiovascular system: regular rhythm, s1-s2 Gastrointestinal system: Nondistended, nontender, pos BS Central nervous system: No seizures, no tremors Extremities: No cyanosis, no joint deformities Skin: No rashes, no pallor Psychiatry: Affect normal // no auditory hallucinations    Data Reviewed:  Labs reviewed: Na 127, Hgb 7.8  Family Communication: Pt in room, family not at bedside  Disposition: Status is: Inpatient Remains inpatient appropriate because: Severity of illness  Planned Discharge Destination: Home    Author: Marylu Lund, MD 09/02/2021 3:31 PM  For on call review [email or web address].

## 2021-09-02 NOTE — Progress Notes (Signed)
PT Cancellation Note  Patient Details Name: [name] MRN: [id number] DOB: Dec 14, 1965   Cancelled Treatment:    Reason Eval/Treat Not Completed: (P) Patient at procedure or test/unavailable Pt is off floor for HD. PT will follow back this afternoon as able.  Kyleen Villatoro B. [name] PT, DPT Acute Rehabilitation Services Please use secure chat or  Call Office [phone]    [place] 09/02/2021, 9:44 AM

## 2021-09-03 DIAGNOSIS — I10 Essential (primary) hypertension: Secondary | ICD-10-CM | POA: Diagnosis not present

## 2021-09-03 DIAGNOSIS — M86252 Subacute osteomyelitis, left femur: Secondary | ICD-10-CM | POA: Diagnosis not present

## 2021-09-03 DIAGNOSIS — E785 Hyperlipidemia, unspecified: Secondary | ICD-10-CM | POA: Diagnosis not present

## 2021-09-03 LAB — COMPREHENSIVE METABOLIC PANEL
ALT: <5 U/L (ref 0–44)
AST: 15 U/L (ref 15–41)
Albumin: 2.1 g/dL — ABNORMAL LOW (ref 3.5–5.0)
Alkaline Phosphatase: 144 U/L — ABNORMAL HIGH (ref 38–126)
Anion gap: 10 (ref 5–15)
BUN: 32 mg/dL — ABNORMAL HIGH (ref 6–20)
CO2: 27 mmol/L (ref 22–32)
Calcium: 8.4 mg/dL — ABNORMAL LOW (ref 8.9–10.3)
Chloride: 94 mmol/L — ABNORMAL LOW (ref 98–111)
Creatinine, Ser: 4.44 mg/dL — ABNORMAL HIGH (ref 0.44–1.00)
GFR, Estimated: 11 mL/min — ABNORMAL LOW (ref >60)
Glucose, Bld: 189 mg/dL — ABNORMAL HIGH (ref 70–99)
Potassium: 4.9 mmol/L (ref 3.5–5.1)
Sodium: 131 mmol/L — ABNORMAL LOW (ref 135–145)
Total Bilirubin: 0.5 mg/dL (ref 0.3–1.2)
Total Protein: 5.6 g/dL — ABNORMAL LOW (ref 6.5–8.1)

## 2021-09-03 LAB — CBC
HCT: 23.7 % — ABNORMAL LOW (ref 36.0–46.0)
Hemoglobin: 7.4 g/dL — ABNORMAL LOW (ref 12.0–15.0)
MCH: 30.5 pg (ref 26.0–34.0)
MCHC: 31.2 g/dL (ref 30.0–36.0)
MCV: 97.5 fL (ref 80.0–100.0)
Platelets: 257 10*3/uL (ref 150–400)
RBC: 2.43 MIL/uL — ABNORMAL LOW (ref 3.87–5.11)
RDW: 17.2 % — ABNORMAL HIGH (ref 11.5–15.5)
WBC: 10.5 10*3/uL (ref 4.0–10.5)
nRBC: 0 % (ref 0.0–0.2)

## 2021-09-03 LAB — GLUCOSE, CAPILLARY
Glucose-Capillary: 189 mg/dL — ABNORMAL HIGH (ref 70–99)
Glucose-Capillary: 160 mg/dL — ABNORMAL HIGH (ref 70–99)
Glucose-Capillary: 180 mg/dL — ABNORMAL HIGH (ref 70–99)
Glucose-Capillary: 168 mg/dL — ABNORMAL HIGH (ref 70–99)
Glucose-Capillary: 210 mg/dL — ABNORMAL HIGH (ref 70–99)

## 2021-09-03 NOTE — Plan of Care (Signed)
  Problem: Education: Goal: Ability to describe self-care measures that may prevent or decrease complications (Diabetes Survival Skills Education) will improve Outcome: Progressing Goal: Individualized Educational Video(s) Outcome: Progressing   Problem: Coping: Goal: Ability to adjust to condition or change in health will improve Outcome: Progressing   Problem: Fluid Volume: Goal: Ability to maintain a balanced intake and output will improve Outcome: Progressing   Problem: Health Behavior/Discharge Planning: Goal: Ability to identify and utilize available resources and services will improve Outcome: Progressing Goal: Ability to manage health-related needs will improve Outcome: Progressing   Problem: Metabolic: Goal: Ability to maintain appropriate glucose levels will improve Outcome: Progressing   Problem: Nutritional: Goal: Maintenance of adequate nutrition will improve Outcome: Progressing Goal: Progress toward achieving an optimal weight will improve Outcome: Progressing   Problem: Skin Integrity: Goal: Risk for impaired skin integrity will decrease Outcome: Progressing   Problem: Tissue Perfusion: Goal: Adequacy of tissue perfusion will improve Outcome: Progressing   Problem: Education: Goal: Knowledge of General Education information will improve Description: Including pain rating scale, medication(s)/side effects and non-pharmacologic comfort measures Outcome: Progressing   Problem: Health Behavior/Discharge Planning: Goal: Ability to manage health-related needs will improve Outcome: Progressing   Problem: Clinical Measurements: Goal: Ability to maintain clinical measurements within normal limits will improve Outcome: Progressing Goal: Will remain free from infection Outcome: Progressing Goal: Diagnostic test results will improve Outcome: Progressing Goal: Respiratory complications will improve Outcome: Progressing Goal: Cardiovascular complication will  be avoided Outcome: Progressing   Problem: Activity: Goal: Risk for activity intolerance will decrease Outcome: Not Progressing   Problem: Nutrition: Goal: Adequate nutrition will be maintained Outcome: Progressing   Problem: Coping: Goal: Level of anxiety will decrease Outcome: Progressing   Problem: Elimination: Goal: Will not experience complications related to bowel motility Outcome: Progressing Goal: Will not experience complications related to urinary retention Outcome: Progressing   Problem: Pain Managment: Goal: General experience of comfort will improve Outcome: Progressing   Problem: Safety: Goal: Ability to remain free from injury will improve Outcome: Progressing   Problem: Skin Integrity: Goal: Risk for impaired skin integrity will decrease Outcome: Progressing

## 2021-09-03 NOTE — Progress Notes (Signed)
Patient ID: [name], female   DOB: [birth year], 56 y.o.   MRN: [id number] Patient is status post debridement left thigh.  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  Patient has a new canister with clear serosanguineous drainage.  Anticipate patient will need long-term antibiotics.  Patient wants to plan for discharge to home.

## 2021-09-03 NOTE — Progress Notes (Signed)
[facility] Progress Note   Subjective:  Seen in room - no overnight CP/dyspnea. Wound vac cannister changed this AM. Tissue Cx growing VRE. Did well with HD yesterday, next tomorrow.   Objective Vitals:   09/02/21 1229 09/02/21 1639 09/02/21 2140 09/03/21 0501  BP: (!) 120/47 102/63 (!) 146/48 (!) 146/57  Pulse: 67 69 63 63  Resp: 12 18 18 19   Temp: 98.1 F (36.7 C) 98.2 F (36.8 C) 98 F (36.7 C) 98 F (36.7 C)  TempSrc:    Oral  SpO2: 92% 96% 100% 100%  Weight:      Height:       Physical Exam General: Well appearing woman, NAD. Room air. Heart: RRR; no murmur Lungs: CTA anteriorly Abdomen:soft, non-tender Extremities: RLE with 1+ edema; L BKA with 1+ stump edema. Wound vac to L hip area Dialysis Access: L AVF + bruit  Additional Objective Labs: Basic Metabolic Panel: Recent Labs  Lab 08/28/21 0349 08/30/21 0645 08/31/21 0351 09/02/21 0745 09/03/21 0153  NA 132*   < > 131* 127* 131*  K 3.5   < > 3.9 4.9 4.9  CL 92*   < > 92* 88* 94*  CO2 28   < > 28 28 27   GLUCOSE 208*   < > 276* 189* 189*  BUN 23*   < > 29* 52* 32*  CREATININE 4.32*   < > 4.34* 6.67* 4.44*  CALCIUM 7.8*   < > 7.8* 8.6* 8.4*  PHOS 3.5  --   --   --   --    < > = values in this interval not displayed.   Liver Function Tests: Recent Labs  Lab 08/31/21 0351 09/02/21 0745 09/03/21 0153  AST 9* 13* 15  ALT <5 <5 <5  ALKPHOS 121 163* 144*  BILITOT 0.6 0.5 0.5  PROT 5.5* 6.1* 5.6*  ALBUMIN 1.9* 2.3* 2.1*   CBC: Recent Labs  Lab 08/30/21 0645 08/31/21 0351 09/01/21 0142 09/02/21 0745 09/03/21 0153  WBC 9.7 9.0 7.7 10.9* 10.5  HGB 8.0* 8.3* 7.2* 7.8* 7.4*  HCT 25.6* 26.1* 22.6* 24.9* 23.7*  MCV 92.8 93.9 94.2 96.5 97.5  PLT 239 212 233 308 257   Blood Culture    Component Value Date/Time   SDES TISSUE 08/28/2021 0917   SPECREQUEST LEFT THIGH TISSUE 08/28/2021 0917   CULT  08/28/2021 0917    MODERATE ENTEROCOCCUS FAECIUM VANCOMYCIN RESISTANT ENTEROCOCCUS  ISOLATED NO ANAEROBES ISOLATED Sent to [facility] for further susceptibility testing. Performed at [facility], [place] [address], [place], [place] [address]    REPTSTATUS 09/02/2021 FINAL 08/28/2021 5956   Medications:  sodium chloride     sodium chloride Stopped (08/29/21 0139)   sodium chloride     DAPTOmycin (CUBICIN) 900 mg in sodium chloride 0.9 % IVPB 900 mg (09/02/21 2200)   methocarbamol (ROBAXIN) IV      alosetron  1 mg Oral BID   amitriptyline  25 mg Oral QHS   amLODipine  5 mg Oral Daily   atorvastatin  40 mg Oral q1800   carvedilol  6.25 mg Oral BID   cyclobenzaprine  10 mg Oral TID   darbepoetin (ARANESP) injection - DIALYSIS  60 mcg Intravenous Q Fri-HD   docusate sodium  100 mg Oral BID   DULoxetine  60 mg Oral Daily   heparin  5,000 Units Subcutaneous Q8H   hydrALAZINE  50 mg Oral TID   insulin aspart  0-6 Units Subcutaneous TID WC  insulin aspart  5 Units Subcutaneous TID WC   insulin glargine-yfgn  12 Units Subcutaneous QHS   multivitamin  1 tablet Oral QHS   pantoprazole  80 mg Oral Daily   sevelamer carbonate  1,600 mg Oral TID WC   sodium chloride flush  3 mL Intravenous Q12H    Dialysis Orders: [facility] MWF 4h  400/500  90kg  2/2 bath  P2  Heparin 4000 units IV TIW  LUA AVF   Assessment/Plan: L hip infection: S/p recent L hip surgery on 08/10/21 -> persistent infection. Went to OR 08/28/2021 and 08/31/21 for L hip debridement, wound vac in place. Wound Cx + VRE, on Daptomycin. ESRD - on HD MWF. Serial HD 06/28, 6/29 and 6/30. Volume status and Na are improved but still with edema. HD tomorrow, 4L UFG. HTN/ vol - BP high on admission, now improved. She is now below her EDW after serial HD but suspect she likely lost some weight. Occasional soft BP here, follow trend and will adjust meds as needed.    Anemia of ESRD - Hgb 7.4, declined post op. S/p PRBC. Holding heparin with HD. Continue Aranesp q Friday, ^ next dose. Secondary HPTH: CCa in range, PO4 at  goal.  Cont binders.  DM2 - per pmd   Veneta Penton, PA-C 09/03/2021, 8:57 AM  [facility]

## 2021-09-03 NOTE — Progress Notes (Signed)
Progress Note   Patient: [name] GBT:[id number] DOB: May 16, 1965 DOA: 08/26/2021     8 DOS: the patient was seen and examined on 09/03/2021   Brief hospital course: 56 y.o. female with past medical history significant for HTN, HLD, ESRD on HD MWF, history of left BKA 2021, depression/anxiety, history of CVA, tobacco use disorder, type 2 diabetes mellitus, insomnia, chronic pain syndrome, recent mechanical fall with left femur fracture deemed nonoperable by orthopedics and recent superficial abscess left hip s/p I&D 5/28 with partial resection left femur for osteomyelitis with soft tissue debridement who presents to [facility] ED on 6/26 with complaints of bloody drainage from her surgical incision site.  Patient reports some drainage occasionally, worse with any type of movement.  Given the drainage increasing EMS was called to evaluate.  Patient also complains of right knee pain, states twisted right knee a few days prior.  Denies fever/chills, no visual changes, no headache, no chest pain, no palpitations, no shortness of breath, no cough/congestion, no nausea/vomiting/diarrhea, no urinary symptoms.  Currently receiving cefazolin with HD per ID recommendations.   In the ED, temperature 97.7 F, HR 76, RR 17, BP 170/65, SPO2 97% on room air.  WBC 13.1, hemoglobin 8.7, platelets 269.  Sodium 133, potassium 4.4, chloride 93, CO2 25, glucose 191, BUN 41, creatinine 6.41.  Lactic acid 1.2.  CT left hip without contrast with comminuted ununited left intratrochanteric fracture, prior intramedullary nail and interlocking femoral neck screw which had been removed, soft tissue wound overlying greater trochanter with air extending from the skin surface to the defect in the proximal femoral diaphysis into the joint space, appearance concerning for infection, persistent partial osteolysis posterior lateral cortex of the proximal femoral diaphysis, no drainable fluid collection, soft tissue wound overlying lateral aspect  proximal femoral diaphysis with small bubble of air within the soft tissues which may be postsurgical versus secondary to soft tissue infection.  Orthopedics, Dr. [name] was consulted.  Hospital service consulted for further evaluation management for likely need of further operative management/I&D.  Assessment and Plan: Left femur osteomyelitis with abscess Patient presenting with increased drainage from left hip surgical site.  History of hospital-itis with abscess s/p hardware removal and partial resection of left femur and I&D abscess and soft tissue.  CT left hip on admission shows concern for infection within the soft tissues. --Orthopedics, Dr. [name] and infectious disease following, appreciate assistance --S/p I/d 6/28 with repeat debridement performed 7/1 --Cont daptomycin per ID recs. Per Ortho, will need long-term abx   Hx Fracture femoral condyle, left, closed History of left femoral hip fracture 07/2021 that was deemed inoperable by orthopedics. --Nonweightbearing left lower extremity   Right knee pain likely secondary to sprain Patient reports twisting her knee a few days prior.  X-ray right knee with soft tissue edema without osseous abnormality. --continue with analgesia as needed   ESRD on HD MWF --Nephrology following -cont on hd as scheduled   Type 2 diabetes mellitus Hemoglobin A1c 7.01 Jul 2021.  Home regimen includes Lantus 10 units copiously daily, NovoLog 10 units 3 times daily AC. --Currently Semglee at 12 units -On novolog 5 units with meals --Novolog SSI for coverage --Glucose recently peaked to 400's likely secondary to eating high carb foods. Bags of crackers and chocolates on bedside table during rounds -Pt clearly has issues with dietary noncompliance.   Essential hypertension --Amlodipine 5 mg p.o. daily --Carvedilol 6.25 mg p.o. twice daily --Hydralazine 50 mg p.o. TID -BP stable   HLD: Atorvastatin  40 mg p.o. daily   Anxiety/depression: --Cymbalta 60  mg p.o. daily --Elavil 25 mg p.o. nightly   GERD: Continue PPI   Iron deficiency anemia with blood loss anemia Baseline hemoglobin around 9 (8.0 at most recent discharge).  Hemoglobin 8.7 on admission. --Transfuse for hemoglobin less than 7.0 -On 6/29 post op, hgb down to 6.5, 1 unit PRBC given --Hgb currently 7.4 -Cont to follow CBC trends  Hyponatremia -cont volume management on HD      Subjective:  Without complaints this AM  Physical Exam: Vitals:   09/02/21 1639 09/02/21 2140 09/03/21 0501 09/03/21 0921  BP: 102/63 (!) 146/48 (!) 146/57 (!) 143/57  Pulse: 69 63 63 68  Resp: 18 18 19 18   Temp: 98.2 F (36.8 C) 98 F (36.7 C) 98 F (36.7 C) 98 F (36.7 C)  TempSrc:   Oral   SpO2: 96% 100% 100% 100%  Weight:      Height:       General exam: Awake, laying in bed, in nad Respiratory system: Normal respiratory effort, no wheezing Cardiovascular system: regular rate, s1, s2 Gastrointestinal system: Soft, nondistended, positive BS Central nervous system: CN2-12 grossly intact, strength intact Extremities: Perfused, no clubbing, s/p L AKA Skin: Normal skin turgor, no notable skin lesions seen Psychiatry: Mood normal // no visual hallucinations   Data Reviewed:  Labs reviewed: Na 131, Hgb 7.4  Family Communication: Pt in room, family not at bedside  Disposition: Status is: Inpatient Remains inpatient appropriate because: Severity of illness  Planned Discharge Destination: Home    Author: Marylu Lund, MD 09/03/2021 3:33 PM  For on call review [email or web address].

## 2021-09-04 DIAGNOSIS — N186 End stage renal disease: Secondary | ICD-10-CM | POA: Diagnosis not present

## 2021-09-04 DIAGNOSIS — Z992 Dependence on renal dialysis: Secondary | ICD-10-CM | POA: Diagnosis not present

## 2021-09-04 DIAGNOSIS — M86252 Subacute osteomyelitis, left femur: Secondary | ICD-10-CM | POA: Diagnosis not present

## 2021-09-04 LAB — COMPREHENSIVE METABOLIC PANEL
ALT: <5 U/L (ref 0–44)
AST: 13 U/L — ABNORMAL LOW (ref 15–41)
Albumin: 2.4 g/dL — ABNORMAL LOW (ref 3.5–5.0)
Alkaline Phosphatase: 140 U/L — ABNORMAL HIGH (ref 38–126)
Anion gap: 10 (ref 5–15)
BUN: 49 mg/dL — ABNORMAL HIGH (ref 6–20)
CO2: 27 mmol/L (ref 22–32)
Calcium: 8.8 mg/dL — ABNORMAL LOW (ref 8.9–10.3)
Chloride: 89 mmol/L — ABNORMAL LOW (ref 98–111)
Creatinine, Ser: 5.69 mg/dL — ABNORMAL HIGH (ref 0.44–1.00)
GFR, Estimated: 8 mL/min — ABNORMAL LOW (ref >60)
Glucose, Bld: 159 mg/dL — ABNORMAL HIGH (ref 70–99)
Potassium: 5.6 mmol/L — ABNORMAL HIGH (ref 3.5–5.1)
Sodium: 126 mmol/L — ABNORMAL LOW (ref 135–145)
Total Bilirubin: 0.6 mg/dL (ref 0.3–1.2)
Total Protein: 6.4 g/dL — ABNORMAL LOW (ref 6.5–8.1)

## 2021-09-04 LAB — CBC
HCT: 26.3 % — ABNORMAL LOW (ref 36.0–46.0)
HCT: 24.8 % — ABNORMAL LOW (ref 36.0–46.0)
Hemoglobin: 8.2 g/dL — ABNORMAL LOW (ref 12.0–15.0)
Hemoglobin: 8 g/dL — ABNORMAL LOW (ref 12.0–15.0)
MCH: 30.4 pg (ref 26.0–34.0)
MCH: 30.9 pg (ref 26.0–34.0)
MCHC: 31.2 g/dL (ref 30.0–36.0)
MCHC: 32.3 g/dL (ref 30.0–36.0)
MCV: 97.4 fL (ref 80.0–100.0)
MCV: 95.8 fL (ref 80.0–100.0)
Platelets: 317 10*3/uL (ref 150–400)
Platelets: 321 10*3/uL (ref 150–400)
RBC: 2.7 MIL/uL — ABNORMAL LOW (ref 3.87–5.11)
RBC: 2.59 MIL/uL — ABNORMAL LOW (ref 3.87–5.11)
RDW: 17.2 % — ABNORMAL HIGH (ref 11.5–15.5)
RDW: 16.8 % — ABNORMAL HIGH (ref 11.5–15.5)
WBC: 12.4 10*3/uL — ABNORMAL HIGH (ref 4.0–10.5)
WBC: 12.2 10*3/uL — ABNORMAL HIGH (ref 4.0–10.5)
nRBC: 0 % (ref 0.0–0.2)
nRBC: 0 % (ref 0.0–0.2)

## 2021-09-04 LAB — GLUCOSE, CAPILLARY
Glucose-Capillary: 180 mg/dL — ABNORMAL HIGH (ref 70–99)
Glucose-Capillary: 122 mg/dL — ABNORMAL HIGH (ref 70–99)
Glucose-Capillary: 250 mg/dL — ABNORMAL HIGH (ref 70–99)

## 2021-09-04 LAB — MINIMUM INHIBITORY CONC. (1 DRUG)

## 2021-09-04 LAB — MIC RESULT

## 2021-09-04 MED ORDER — DARBEPOETIN ALFA 150 MCG/0.3ML IJ SOSY
150.0000 ug | PREFILLED_SYRINGE | INTRAMUSCULAR | Status: DC
  Administered 2021-09-06: 150 ug via INTRAVENOUS
  Filled 2021-09-04 (×2): qty 0.3

## 2021-09-04 NOTE — Progress Notes (Signed)
[facility] Progress Note   Subjective:  Seen in room and then later at onset of HD - 4L UFG and tolerating. No CP/dyspnea. Still with bloody wound vac output. Asked if she was feeling up to dialyzing in recliner today - she says not yet.  Objective Vitals:   09/04/21 0848 09/04/21 0902 09/04/21 0920 09/04/21 0929  BP:  (!) 138/44 (!) 127/52 139/62  Pulse:  72 69 72  Resp:  18 16 16   Temp:  98.2 F (36.8 C)    TempSrc:  Oral    SpO2:  99% 98% 99%  Weight: 95.1 kg     Height:       Physical Exam General: Well appearing woman, NAD. Room air. Heart: RRR; no murmur Lungs: CTA anteriorly Abdomen:soft, non-tender Extremities: RLE with 1+ edema; L BKA with 1+ stump edema. Wound vac to L hip area Dialysis Access: L AVF + bruit  Additional Objective Labs: Basic Metabolic Panel: Recent Labs  Lab 09/02/21 0745 09/03/21 0153 09/04/21 0232  NA 127* 131* 126*  K 4.9 4.9 5.6*  CL 88* 94* 89*  CO2 28 27 27   GLUCOSE 189* 189* 159*  BUN 52* 32* 49*  CREATININE 6.67* 4.44* 5.69*  CALCIUM 8.6* 8.4* 8.8*   Liver Function Tests: Recent Labs  Lab 09/02/21 0745 09/03/21 0153 09/04/21 0232  AST 13* 15 13*  ALT <5 <5 <5  ALKPHOS 163* 144* 140*  BILITOT 0.5 0.5 0.6  PROT 6.1* 5.6* 6.4*  ALBUMIN 2.3* 2.1* 2.4*   CBC: Recent Labs  Lab 09/01/21 0142 09/02/21 0745 09/03/21 0153 09/04/21 0232 09/04/21 0913  WBC 7.7 10.9* 10.5 12.4* 12.2*  HGB 7.2* 7.8* 7.4* 8.2* 8.0*  HCT 22.6* 24.9* 23.7* 26.3* 24.8*  MCV 94.2 96.5 97.5 97.4 95.8  PLT 233 308 257 317 321   Blood Culture    Component Value Date/Time   SDES TISSUE 08/28/2021 0917   SPECREQUEST LEFT THIGH TISSUE 08/28/2021 0917   CULT  08/28/2021 0917    MODERATE ENTEROCOCCUS FAECIUM VANCOMYCIN RESISTANT ENTEROCOCCUS ISOLATED NO ANAEROBES ISOLATED Sent to [facility] for further susceptibility testing. Performed at [facility], [place] [address] [place], [place] [address]    REPTSTATUS 09/02/2021  FINAL 08/28/2021 5974   Medications:  sodium chloride     sodium chloride Stopped (08/29/21 0139)   sodium chloride     DAPTOmycin (CUBICIN) 900 mg in sodium chloride 0.9 % IVPB 900 mg (09/02/21 2200)   methocarbamol (ROBAXIN) IV      alosetron  1 mg Oral BID   amitriptyline  25 mg Oral QHS   amLODipine  5 mg Oral Daily   atorvastatin  40 mg Oral q1800   carvedilol  6.25 mg Oral BID   cyclobenzaprine  10 mg Oral TID   darbepoetin (ARANESP) injection - DIALYSIS  60 mcg Intravenous Q Fri-HD   docusate sodium  100 mg Oral BID   DULoxetine  60 mg Oral Daily   heparin  5,000 Units Subcutaneous Q8H   hydrALAZINE  50 mg Oral TID   insulin aspart  0-6 Units Subcutaneous TID WC   insulin aspart  5 Units Subcutaneous TID WC   insulin glargine-yfgn  12 Units Subcutaneous QHS   multivitamin  1 tablet Oral QHS   pantoprazole  80 mg Oral Daily   sevelamer carbonate  1,600 mg Oral TID WC   sodium chloride flush  3 mL Intravenous Q12H    Dialysis Orders: [facility] MWF 4h  400/500  90kg  2/2 bath  P2  Heparin 4000 units IV TIW  LUA AVF   Assessment/Plan: L hip infection: S/p recent L hip surgery on 08/10/21 -> persistent infection. Went to OR 08/28/2021 and 08/31/21 for L hip debridement, wound vac in place. Wound Cx + VRE, on Daptomycin. ESRD - on HD MWF. Serial HD 06/28, 6/29 and 6/30. Volume status improved but still with edema. HD today. HTN/ volume: BP high on admission, now improved. Weight climbing back up -> may need another "extra" HD. Occasional soft BP here, follow trend and will adjust meds as needed.    Anemia of ESRD - Hgb declined post op - 2U PRBCs given 6/29. Hgb 8 now. Holding heparin with HD. Continue Aranesp q Friday, ^ next dose. Secondary HPTH: CCa in range, PO4 at goal.  Cont binders.  DM2: Insulin per primary.  Veneta Penton, PA-C 09/04/2021, 9:35 AM  [employer]

## 2021-09-04 NOTE — Progress Notes (Signed)
Received patient in bed, alert and oriented. Informed consent signed and in chart.  Time tx completed:4.0  HD treatment completed. Patient tolerated well. Fistula  without signs and symptoms of complications. Patient transported back to the room, alert and orient and in no acute distress. Report given to bedside RN.  Total UF removed:3500  Medication given:  Post HD VS:98.6,73,16,118/58(70)  Post HD weight: 91.4kg

## 2021-09-04 NOTE — Progress Notes (Signed)
Pt off unit to hemodialysis. 

## 2021-09-04 NOTE — Progress Notes (Addendum)
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  Active Problems:   TOBACCO DEPENDENCE   GASTROESOPHAGEAL REFLUX, NO ESOPHAGITIS   Anxiety and depression   Wound infection   ESRD (end stage renal disease) on dialysis (HCC)   Benign essential HTN   Dyslipidemia, goal LDL below 100   Fracture of femoral condyle, left, closed ([place])   Uncontrolled type 2 diabetes mellitus with hypoglycemia, with long-term current use of insulin (HCC)   History of CVA (cerebrovascular accident)   Right knee pain   Open toe wound of right 5th toe    Iron deficiency anemia, unspecified   Wound dehiscence, surgical, sequela          Assessment: 89 YF with ESRD history of left hip hardware complicated by I&D and admitted with bleeding from surgical site.    #Left hip septic joint arthritis/femoral osteomyelitis followed by removal on 6/10(+MSSA) SP left hip debridement on 6/28 with cultures growing VRE -6/1  OR with nail removal and femur debridement Cx+ MSSA. On cefazolin x 6 weeks EOT 09/22/21.  -CT on 6/26 showed air extending from skin to proximal femoral diaphysis and into joint space concerning for infection.  -OR Cx from 6/28 + vanc resistant enterococcus faucium(sens to daptomycin). Repeat debridement on 7/1 with Dr. Wyline Mood) -All hardware out plan to treat with daptomycin x 6 weeks. Plan verbally discussed with Primary (Dr. Wynelle Cleveland). Recommendations: -Continue daptomycin with iHD x 6 weeks EOT 10/12/21 -Follow-up with ID with myself on 7/20   Microbiology:   Antibiotics: Daptomycin 6/29-p Cultures:  6/28 Aerobic/anaerobic Cx: Enterococcus faecium DAPTOMYCIN   <=4 UG/ML = SUSCEPTIBLE    SUBJECTIVE: Resting in bed during HD. No new complaints. Interval: Afebrile overnight. Wbc 12k.   Review of Systems: Review of Systems  All other systems reviewed and are  negative.    Scheduled Meds:  alosetron  1 mg Oral BID   amitriptyline  25 mg Oral QHS   amLODipine  5 mg Oral Daily   atorvastatin  40 mg Oral q1800   carvedilol  6.25 mg Oral BID   cyclobenzaprine  10 mg Oral TID   [START ON 09/06/2021] darbepoetin (ARANESP) injection - DIALYSIS  150 mcg Intravenous Q Fri-HD   docusate sodium  100 mg Oral BID   DULoxetine  60 mg Oral Daily   heparin  5,000 Units Subcutaneous Q8H   hydrALAZINE  50 mg Oral TID   insulin aspart  0-6 Units Subcutaneous TID WC   insulin aspart  5 Units Subcutaneous TID WC   insulin glargine-yfgn  12 Units Subcutaneous QHS   multivitamin  1 tablet Oral QHS   pantoprazole  80 mg Oral Daily   sevelamer carbonate  1,600 mg Oral TID WC   sodium chloride flush  3 mL Intravenous Q12H   Continuous Infusions:  sodium chloride     sodium chloride Stopped (08/29/21 0139)   sodium chloride     DAPTOmycin (CUBICIN) 900 mg in sodium chloride 0.9 % IVPB 900 mg (09/02/21 2200)   methocarbamol (ROBAXIN) IV     PRN Meds:.sodium chloride, acetaminophen, bisacodyl, calcium carbonate, diphenoxylate-atropine, hydrALAZINE, HYDROmorphone (DILAUDID) injection, methocarbamol **OR** methocarbamol (ROBAXIN) IV, metoCLOPramide **OR** metoCLOPramide (REGLAN) injection, ondansetron **OR** ondansetron (ZOFRAN) IV, oxyCODONE, oxyCODONE, polyethylene glycol, sodium chloride flush Allergies  Allergen Reactions   Trazodone Swelling   Latex Rash   Lidocaine Itching  OBJECTIVE: Vitals:   09/04/21 1230 09/04/21 1300 09/04/21 1330 09/04/21 1459  BP: (!) 114/54 (!) 116/57 117/67 (!) 115/49  Pulse: 70 72 71 76  Resp: 10 19 14 18   Temp:      TempSrc:      SpO2: 94% 98% 99% 96%  Weight:      Height:       Body mass index is 29.25 kg/m.  Physical Exam Constitutional:      Appearance: Normal appearance.  HENT:     Head: Normocephalic and atraumatic.     Right Ear: Tympanic membrane normal.     Left Ear: Tympanic membrane normal.      Nose: Nose normal.     Mouth/Throat:     Mouth: Mucous membranes are moist.  Eyes:     Extraocular Movements: Extraocular movements intact.     Conjunctiva/sclera: Conjunctivae normal.     Pupils: Pupils are equal, round, and reactive to light.  Cardiovascular:     Rate and Rhythm: Normal rate and regular rhythm.     Heart sounds: No murmur heard.    No friction rub. No gallop.  Pulmonary:     Effort: Pulmonary effort is normal.     Breath sounds: Normal breath sounds.  Abdominal:     General: Abdomen is flat.     Palpations: Abdomen is soft.  Musculoskeletal:        General: Normal range of motion.     Comments: Left hip wound  Skin:    General: Skin is warm and dry.  Neurological:     General: No focal deficit present.     Mental Status: She is alert and oriented to person, place, and time.  Psychiatric:        Mood and Affect: Mood normal.       Lab Results Lab Results  Component Value Date   WBC 12.2 (H) 09/04/2021   HGB 8.0 (L) 09/04/2021   HCT 24.8 (L) 09/04/2021   MCV 95.8 09/04/2021   PLT 321 09/04/2021    Lab Results  Component Value Date   CREATININE 5.69 (H) 09/04/2021   BUN 49 (H) 09/04/2021   NA 126 (L) 09/04/2021   K 5.6 (H) 09/04/2021   CL 89 (L) 09/04/2021   CO2 27 09/04/2021    Lab Results  Component Value Date   ALT <5 09/04/2021   AST 13 (L) 09/04/2021   ALKPHOS 140 (H) 09/04/2021   BILITOT 0.6 09/04/2021        Laurice Record, [place] [facility]ious [facility] 09/04/2021, 3:01 PM

## 2021-09-04 NOTE — Progress Notes (Signed)
PT Cancellation Note  Patient Details Name: [name] MRN: [id number] DOB: 1965-12-31   Cancelled Treatment:    Reason Eval/Treat Not Completed: (P) Other (comment) Pt refusing bed mobility today, reporting Dr [name] stated that she does not need to be moving in bed while wound vac is in place. Dr [name] informed via chat while in pt room. Pt refuses therapy today as she has just returned from HD. Pt informed PT will follow back tomorrow.   Lakesha Levinson B. [name] PT, DPT Acute Rehabilitation Services Please use secure chat or  Call Office [phone]    [place] 09/04/2021, 3:39 PM

## 2021-09-04 NOTE — Progress Notes (Signed)
[facility] Progress Note  Patient: [name]     MPN:[id number]  DOA: 08/26/2021   PCP: [name], MD       Brief hospital course: This is a 56 year old female with end-stage renal disease on hemodialysis, hypertension, hyperlipidemia, left BKA, history of CVA, depression and anxiety, type 2 diabetes mellitus, tobacco use, chronic pain syndrome, left femur fracture after mechanical fall, left hip osteomyelitis and soft tissue abscess status post I&D and partial resection of the femur on 5/28 receiving cefazolin on HD who presented to the hospital on 6/26 with bloody drainage from the surgical incision site. CT scan revealed ununited left intro trochanteric fracture, soft tissue wound overlying the greater trochanter with air extending from the skin surface to the joint space concerning for infection. Dr. [name] was consulted and the patient underwent an I&D on 6/28 and again on 7/1. Cultures have grown out VRE and the patient is receiving daptomycin per ID.  Subjective:  Interestingly, she has no complaints.  She does not complain of any pain in her left hip.  Assessment and Plan: 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 - Currently has a wound VAC which, per Dr. [name] can be changed to [facility] when she is discharged to SNF - ID recommends continuing daptomycin 6 weeks with dialysis with stop date being 10/12/2021 & follow-up with ID, Dr. Candiss Norse on 7/20  Active Problems:   Fracture of femoral condyle, left, closed ([place]) -We will need skilled nursing  Anemia of end-stage renal disease-suspected also to have acute blood loss from above wound - On 6/29 was given 2 units of packed red blood cells - Is oozing blood in the wound [facility] - Nephrology has been holding heparin with hemodialysis  -She is on heparin for DVT prophylaxis - Continue to follow hemoglobin which is 8.0 today    Right knee pain -Suspected to be sprain - Continue conservative  care  Hypokalemia - Recheck tomorrow-she is being dialyzed today    Open toe wound of right 5th toe    ESRD (end stage renal disease) on dialysis [facility]) -Continue hemodialysis Monday Wednesday Friday    Uncontrolled type 2 diabetes mellitus with hypoglycemia, with long-term current use of insulin ([place]) -Noted to have dietary noncompliance per prior MD (crackers and chocolates at bedside) - Continue Semglee, NovoLog with meals and NovoLog sliding scale    Benign essential HTN -Continue amlodipine, carvedilol, hydralazine     DVT prophylaxis:  SCDs Start: 08/31/21 1058 SCDs Start: 08/28/21 1049 heparin injection 5,000 Units Start: 08/26/21 1615     Code Status: Full Code  Consultants: ID, Ortho, nephrology Level of Care: Level of care: Telemetry Medical Disposition Plan:  Status is: Inpatient Remains inpatient appropriate because: Not yet dialyzing in a chair but when she is, she can go to a SNF that will take her to dialysis and manage a wound vac  Objective:   Vitals:   09/04/21 1300 09/04/21 1330 09/04/21 1459 09/04/21 1624  BP: (!) 116/57 117/67 (!) 115/49 (!) 118/47  Pulse: 72 71 76 76  Resp: 19 14 18 18   Temp:   98.3 F (36.8 C)   TempSrc:   Oral   SpO2: 98% 99% 96% 95%  Weight:      Height:       Filed Weights   08/31/21 0830 09/02/21 0831 09/04/21 0848  Weight: 89.8 kg 92.6 kg 95.1 kg   Exam: General exam: Appears comfortable  HEENT: PERRLA, oral mucosa moist,  no sclera icterus or thrush Respiratory system: Clear to auscultation. Respiratory effort normal. Cardiovascular system: S1 & S2 heard, regular rate and rhythm Gastrointestinal system: Abdomen soft, non-tender, nondistended. Normal bowel sounds   Central nervous system: Alert and oriented. No focal neurological deficits. Extremities: No cyanosis, clubbing or edema Skin: Bruising and dressing on left hip noted Psychiatry:  Mood & affect appropriate.    Imaging and lab data was personally  reviewed    CBC: Recent Labs  Lab 09/01/21 0142 09/02/21 0745 09/03/21 0153 09/04/21 0232 09/04/21 0913  WBC 7.7 10.9* 10.5 12.4* 12.2*  HGB 7.2* 7.8* 7.4* 8.2* 8.0*  HCT 22.6* 24.9* 23.7* 26.3* 24.8*  MCV 94.2 96.5 97.5 97.4 95.8  PLT 233 308 257 317 048   Basic Metabolic Panel: Recent Labs  Lab 08/30/21 0645 08/31/21 0351 09/02/21 0745 09/03/21 0153 09/04/21 0232  NA 129* 131* 127* 131* 126*  K 3.7 3.9 4.9 4.9 5.6*  CL 91* 92* 88* 94* 89*  CO2 26 28 28 27 27   GLUCOSE 243* 276* 189* 189* 159*  BUN 37* 29* 52* 32* 49*  CREATININE 6.06* 4.34* 6.67* 4.44* 5.69*  CALCIUM 7.8* 7.8* 8.6* 8.4* 8.8*   GFR: Estimated Creatinine Clearance: 14.4 mL/min (A) (by C-G formula based on SCr of 5.69 mg/dL (H)).  Scheduled Meds:  alosetron  1 mg Oral BID   amitriptyline  25 mg Oral QHS   amLODipine  5 mg Oral Daily   atorvastatin  40 mg Oral q1800   carvedilol  6.25 mg Oral BID   cyclobenzaprine  10 mg Oral TID   [START ON 09/06/2021] darbepoetin (ARANESP) injection - DIALYSIS  150 mcg Intravenous Q Fri-HD   docusate sodium  100 mg Oral BID   DULoxetine  60 mg Oral Daily   heparin  5,000 Units Subcutaneous Q8H   hydrALAZINE  50 mg Oral TID   insulin aspart  0-6 Units Subcutaneous TID WC   insulin aspart  5 Units Subcutaneous TID WC   insulin glargine-yfgn  12 Units Subcutaneous QHS   multivitamin  1 tablet Oral QHS   pantoprazole  80 mg Oral Daily   sevelamer carbonate  1,600 mg Oral TID WC   sodium chloride flush  3 mL Intravenous Q12H   Continuous Infusions:  sodium chloride     sodium chloride Stopped (08/29/21 0139)   sodium chloride     DAPTOmycin (CUBICIN) 900 mg in sodium chloride 0.9 % IVPB 900 mg (09/02/21 2200)   methocarbamol (ROBAXIN) IV       LOS: 9 days   Author: Debbe Odea  09/04/2021 5:37 PM

## 2021-09-04 NOTE — Progress Notes (Signed)
OT Cancellation Note  Patient Details Name: [name] MRN: [id number] DOB: 14-May-1965   Cancelled Treatment:    Reason Eval/Treat Not Completed: Patient declined, no reason specified (Pt reporting being hungry, just returning from HD. Refuses mobility  progression beyond bed level, stating she will not move while wound vac is in place. PT notified Dr. [name] message via securechat. Will follow up tomorrow (7/5) as able.)  Lynnda Child, OTD, OTR/L Acute Rehab [phone] - [place] 09/04/2021, 4:18 PM

## 2021-09-04 NOTE — Progress Notes (Signed)
PHARMACY OUTPATIENT ANTIBIOTIC NOTE   Indication: Hip Joint infection with  VRE/MSSA Regimen: Daptomycin 900 mg every MWF with HD  End date: 10/12/21   No formal OPAT will be done as patient will receive daptomycin at her outpatient dialysis center. Nephrology has been informed and her dialysis center currently has a week's worth of dapto and can order more.       Thank you for allowing pharmacy to be a part of this patient's care.  Jimmy Footman, PharmD, BCPS, BCIDP Infectious Diseases Clinical Pharmacist Phone: [phone] 09/04/2021, 1:08 PM

## 2021-09-05 DIAGNOSIS — M86252 Subacute osteomyelitis, left femur: Secondary | ICD-10-CM | POA: Diagnosis not present

## 2021-09-05 DIAGNOSIS — E875 Hyperkalemia: Secondary | ICD-10-CM

## 2021-09-05 DIAGNOSIS — S72302A Unspecified fracture of shaft of left femur, initial encounter for closed fracture: Secondary | ICD-10-CM

## 2021-09-05 LAB — GLUCOSE, CAPILLARY
Glucose-Capillary: 163 mg/dL — ABNORMAL HIGH (ref 70–99)
Glucose-Capillary: 153 mg/dL — ABNORMAL HIGH (ref 70–99)
Glucose-Capillary: 146 mg/dL — ABNORMAL HIGH (ref 70–99)
Glucose-Capillary: 145 mg/dL — ABNORMAL HIGH (ref 70–99)

## 2021-09-05 LAB — CBC
HCT: 25.2 % — ABNORMAL LOW (ref 36.0–46.0)
Hemoglobin: 7.9 g/dL — ABNORMAL LOW (ref 12.0–15.0)
MCH: 30.7 pg (ref 26.0–34.0)
MCHC: 31.3 g/dL (ref 30.0–36.0)
MCV: 98.1 fL (ref 80.0–100.0)
Platelets: 238 10*3/uL (ref 150–400)
RBC: 2.57 MIL/uL — ABNORMAL LOW (ref 3.87–5.11)
RDW: 17 % — ABNORMAL HIGH (ref 11.5–15.5)
WBC: 9.9 10*3/uL (ref 4.0–10.5)
nRBC: 0 % (ref 0.0–0.2)

## 2021-09-05 LAB — BASIC METABOLIC PANEL
Anion gap: 7 (ref 5–15)
BUN: 34 mg/dL — ABNORMAL HIGH (ref 6–20)
CO2: 28 mmol/L (ref 22–32)
Calcium: 8.4 mg/dL — ABNORMAL LOW (ref 8.9–10.3)
Chloride: 93 mmol/L — ABNORMAL LOW (ref 98–111)
Creatinine, Ser: 4.57 mg/dL — ABNORMAL HIGH (ref 0.44–1.00)
GFR, Estimated: 11 mL/min — ABNORMAL LOW (ref >60)
Glucose, Bld: 158 mg/dL — ABNORMAL HIGH (ref 70–99)
Potassium: 5.5 mmol/L — ABNORMAL HIGH (ref 3.5–5.1)
Sodium: 128 mmol/L — ABNORMAL LOW (ref 135–145)

## 2021-09-05 MED ORDER — SODIUM ZIRCONIUM CYCLOSILICATE 5 G PO PACK
5.0000 g | PACK | Freq: Two times a day (BID) | ORAL | Status: DC
  Administered 2021-09-05 – 2021-09-06 (×3): 5 g via ORAL
  Filled 2021-09-05 (×4): qty 1

## 2021-09-05 NOTE — Progress Notes (Signed)
Patient ID: [name], female   DOB: 07-11-1965, 56 y.o.   MRN: [id number] Patient is a 56 year old woman who is seen in follow-up for debridement left thigh with chronic osteomyelitis.  Most recent cultures are positive for vancomycin-resistant enterococci's.  There is still drainage in the wound VAC canister.  Reinforced with the patient the importance to work on strengthening and mobilization so she is safe for discharge.  Patient does not want to consider skilled nursing at this time.

## 2021-09-05 NOTE — Progress Notes (Signed)
Pharmacy Antibiotic Note  [name] is a 56 y.o. 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 Prior intra-op cultures in May grew MSSA, now with intra-op cultures this admission growing Enterococcus faecium - vancomycin, ampicillin resistant. Pharmacy has been consulted for Daptomycin dosing.  Noted pt is ESRD and being dosed after dialysis MWF   Plan: - Daptomycin 900 mg (10 mg/kg TBW) IV every MWF after HD - Will continue to follow HD schedule/duration, LOT, and antibiotic de-escalation plans   Height: 5' 10.98" (180.3 cm) Weight: 95.1 kg (209 lb 10.5 oz) IBW/kg (Calculated) : 70.76  Temp (24hrs), Avg:98.1 F (36.7 C), Min:98 F (36.7 C), Max:98.3 F (36.8 C)  Recent Labs  Lab 08/31/21 0351 09/01/21 0142 09/02/21 0745 09/03/21 0153 09/04/21 0232 09/04/21 0913 09/05/21 0229  WBC 9.0   < > 10.9* 10.5 12.4* 12.2* 9.9  CREATININE 4.34*  --  6.67* 4.44* 5.69*  --  4.57*   < > = values in this interval not displayed.     Estimated Creatinine Clearance: 17.9 mL/min (A) (by C-G formula based on SCr of 4.57 mg/dL (H)).    Allergies  Allergen Reactions   Trazodone Swelling   Latex Rash   Lidocaine Itching    [name], PharmD, BCPS, FNKF Clinical Pharmacist [place] Please utilize Amion for appropriate phone number to reach the unit pharmacist ([place])  09/05/2021 7:40 AM

## 2021-09-05 NOTE — Progress Notes (Signed)
Met with pt at bedside. Introduced self and explained that I communicate with out-pt HD clinics when pts are hospitalized. Pt advised today that clinic staff will not be able to assist with getting pt from car in to clinic at d/c. Advised pt that should would need to make arrangements with family or friends to assist pt once she arrives at clinic. Pt voices understanding. Will assist as needed.   Melven Sartorius Renal Navigator [phone]

## 2021-09-05 NOTE — Progress Notes (Signed)
Physical Therapy Treatment Patient Details Name: [name] MRN: [id number] DOB: 1965/03/20 Today's Date: 09/05/2021   History of Present Illness 56 y.o. female presented to ED 6/26 from dialysis with increased bloody drainage from L hip wound. 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 s/p hardware removal of left hip with partial resection of tuft tissue and femure that were nonviable on 08/10/21 Discharged home 6/23. underwent L hip debridement and wound vac placement 6/28; +Left intertrochanteric non-union,  Fracture of left distal femur  PMH: hypertension, hyperlipidemia, ESRD on HD MWF, history of left BKA in 2021, depression/anxiety, stroke, tobacco use, T2DM,  insomnia, chronic pain syndrome,    PT Comments    Patient reports she will not go to SNF because she can manage at home. Willing to sit EOB and practice lateral scooting along edge of bed to simulate sliding board transfers (which pt has used previously before she could stand-pivot). Pt not willing to attempt transfer to chair, especially not squat-pivot. Lengthy discussion re: how she will manage at home and biggest barrier to discharge home is her ability to get to/from outpt HD. Patient reports she was driving herself and clinic staff would come out and get her wheelchair out of the car and hold it for her while she transferred. They did the reverse when she was ready to leave. LCSW noted she discussed with pt that clinic staff will not be able to do this for her. Patient is thinking that one of her friends will be able to meet her at dialysis clinic and assist her with wheelchair and in/out of car.  She will have to currently use sliding board to transfer which pt admits she had difficulty doing transfer back into car due to it being "uphill." Discussed if she needs to pursue medical transport and she is hoping she will be able to manage with her friend's assistance.    Recommendations for follow up  therapy are one component of a multi-disciplinary discharge planning process, led by the attending physician.  Recommendations may be updated based on patient status, additional functional criteria and insurance authorization.  Follow Up Recommendations  Skilled nursing-short term rehab (<3 hours/day) (however pt refusing and will need max HH services; may need transportation to/from HD in wheelchair) Can patient physically be transported by private vehicle: No   Assistance Recommended at Discharge Intermittent Supervision/Assistance  Patient can return home with the following A lot of help with walking and/or transfers;A lot of help with bathing/dressing/bathroom;Assistance with cooking/housework;Assist for transportation;Help with stairs or ramp for entrance   Equipment Recommendations  None recommended by PT (?will need transportation to/from HD in wheelchair (pt trying to arrange to have a friend assist her in/out of her car))    Recommendations for Other Services       Precautions / Restrictions Precautions Precautions: Fall;Other (comment) Precaution Comments: L BKA (baseline), increased drainage from LLE wound Restrictions Weight Bearing Restrictions: Yes LLE Weight Bearing: Non weight bearing Other Position/Activity Restrictions: L distal femur fx     Mobility  Bed Mobility Overal bed mobility: Needs Assistance Bed Mobility: Supine to Sit, Sit to Supine Rolling: Supervision   Supine to sit: Supervision Sit to supine: Min guard   General bed mobility comments: insisted on HOB elevated and use of rail (despite acknowledging she does not have this equipment at home); incr time and effort to come to sit; near need for assist with returning LLE up onto mattress, but ultimately pt was able to  use her UEs to assist LLE herself    Transfers Overall transfer level: Needs assistance Equipment used: None              Lateral/Scoot Transfers: Min guard General transfer  comment: pt did not have drop-arm chair in room, therefore practiced lateral scoot to left and then right (x 2 feet each way) in preparation for sliding board transfer to/from wheelchair, BSC with drop-arm, and car    Ambulation/Gait               General Gait Details: non ambulatory at baseline   Stairs             Wheelchair Mobility    Modified Rankin (Stroke Patients Only)       Balance Overall balance assessment: Needs assistance Sitting-balance support: Feet supported Sitting balance-Leahy Scale: Good Sitting balance - Comments: ability to transfer weight left and right in sitting                                    Cognition Arousal/Alertness: Awake/alert Behavior During Therapy: WFL for tasks assessed/performed Overall Cognitive Status: Within Functional Limits for tasks assessed                                 General Comments: A&Ox4; able to describe in detail how she was managing mobility and ADLs prior to this admission        Exercises      General Comments        Pertinent Vitals/Pain Pain Assessment Pain Assessment: Faces Faces Pain Scale: Hurts little more Pain Location: L hip at drain site; Left knee Pain Descriptors / Indicators: Discomfort, Sore Pain Intervention(s): Limited activity within patient's tolerance, Monitored during session, Repositioned    Home Living Family/patient expects to be discharged to:: Private residence Living Arrangements: Children Available Help at Discharge: Family;Available PRN/intermittently Type of Home: Mobile home Home Access: Ramped entrance       Home Layout: One level Home Equipment: Wheelchair - manual;BSC/3in1;Rollator (4 wheels)      Prior Function            PT Goals (current goals can now be found in the care plan section) Acute Rehab PT Goals Patient Stated Goal: less pain with movement Time For Goal Achievement: 09/10/21 Potential to Achieve Goals:  Fair Progress towards PT goals: Progressing toward goals    Frequency    Min 3X/week      PT Plan Current plan remains appropriate    Co-evaluation PT/OT/SLP Co-Evaluation/Treatment: Yes Reason for Co-Treatment: Necessary to address cognition/behavior during functional activity;For patient/therapist safety;To address functional/ADL transfers PT goals addressed during session: Mobility/safety with mobility;Balance        AM-PAC PT "6 Clicks" Mobility   Outcome Measure  Help needed turning from your back to your side while in a flat bed without using bedrails?: A Little Help needed moving from lying on your back to sitting on the side of a flat bed without using bedrails?: A Little Help needed moving to and from a bed to a chair (including a wheelchair)?: Total Help needed standing up from a chair using your arms (e.g., wheelchair or bedside chair)?: Total Help needed to walk in hospital room?: Total Help needed climbing 3-5 steps with a railing? : Total 6 Click Score: 10    End of Session Equipment  Utilized During Treatment: Gait belt Activity Tolerance: Patient tolerated treatment well Patient left: in bed;with call bell/phone within reach;with bed alarm set   PT Visit Diagnosis: Other abnormalities of gait and mobility (R26.89);Muscle weakness (generalized) (M62.81);History of falling (Z91.81);Pain Pain - Right/Left: Left Pain - part of body: Leg     Time: 5809-9833 PT Time Calculation (min) (ACUTE ONLY): 37 min  Charges:  $Therapeutic Activity: 8-22 mins                      Arby Barrette, PT Acute Rehabilitation Services  Office [phone]    [name] 09/05/2021, 3:18 PM

## 2021-09-05 NOTE — Progress Notes (Signed)
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 CT scan revealed ununited left inter-trochanteric fracture, soft tissue wound overlying the greater trochanter with air extending from the skin surface to the joint space concerning for infection. Dr. [name] was consulted and the patient underwent an I&D on 6/28 and again on 7/1. Cultures have grown out VRE and the patient is receiving daptomycin per ID.  Subjective:  States she is weak and much to weak to dialyze in a chair. When discussing that she might soon be transitioning from the hospital to SNF as she is reaching medical stability, she refuses to go to SNF.  She states she will go home eventually from the hospital but not until she is stronger.     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 [name] can be changed to [facility] when she is discharged to SNF -Most recent cultures growing VRE - ID recommends continuing daptomycin 6 weeks with dialysis with stop date being 10/12/2021 & follow-up with ID, Dr. Candiss Norse on 7/20  Active Problems: Left intertrochanteric  Fracture of left distal femur left - continue PT  efforts  Hyperkalemia - Potassium was 5.6 yesterday and despite dialysis, was 5.5 this morning - I have ordered Lokelma and we will follow the potassium  Hyponatremia sodium 126 yesterday and today 128 - I will allow nephrology to manage this dialysis  Anemia of end-stage renal disease-suspected also to have acute blood loss from above wound - On 6/29 was given 2 units of packed red blood cells - Is oozing blood in the wound [facility] - Nephrology has been holding heparin with hemodialysis  -She is on heparin for DVT prophylaxis - Continue to follow hemoglobin which is 8.0 today    Right knee pain -Suspected to be sprain - Continue conservative care  Hypokalemia - Recheck tomorrow-she is being dialyzed today    ESRD (end stage renal disease) on dialysis since 11/22 -Continue hemodialysis Monday Wednesday Friday    Uncontrolled type 2 diabetes mellitus with hypoglycemia, with long-term current use of insulin ([place]) Hemoglobin A1C    Component Value Date/Time   HGBA1C 7.1 (H) 07/22/2021 1446   HGBA1C 7.6 (A) 05/10/2018 1120   - Continue Semglee, NovoLog with meals and NovoLog sliding scale    Benign essential HTN -Continue amlodipine, carvedilol, hydralazine  Right lower extremity edema History of right SFA revascularization - Elevate as possible     DVT prophylaxis:  SCDs Start: 08/31/21 1058 SCDs Start: 08/28/21 1049 heparin injection 5,000 Units Start: 08/26/21 1615     Code Status: Full Code  Consultants: ID, Ortho, nephrology Level of Care: Level of care: Telemetry Medical Disposition Plan:  Status is: Inpatient Remains inpatient appropriate because: currently hyperkalemic,  refusing to dialyze  in a chair- when she does, she can go to a SNF that will take her to dialysis and manage a wound vac  Objective:   Vitals:   09/04/21 1624 09/04/21 2050 09/05/21 0617 09/05/21 0935  BP: (!) 118/47 121/61 (!) 139/54 (!) 111/58  Pulse: 76 66 66 71  Resp: 18 18 16 19    Temp:  98 F (36.7 C) 98 F (36.7 C) 98.5 F (36.9 C)  TempSrc:  Oral Oral Oral  SpO2: 95% 97% 100% 97%  Weight:      Height:       Filed Weights   08/31/21 0830 09/02/21 0831 09/04/21 0848  Weight: 89.8 kg 92.6 kg 95.1 kg   Exam: General exam: Appears comfortable  HEENT: PERRLA, oral mucosa moist, no sclera icterus or thrush Respiratory system: Clear to auscultation. Respiratory effort normal. Cardiovascular system: S1 & S2 heard, regular rate and rhythm Gastrointestinal system: Abdomen soft, non-tender, nondistended. Normal bowel sounds   Central nervous system: Alert and oriented. No focal neurological deficits. Extremities: No cyanosis, clubbing - b/l LE edema- left BKA, left hip dressing not opened- wound vac present  Psychiatry:  Mood & affect appropriate.    Imaging and lab data was personally reviewed    CBC: Recent Labs  Lab 09/02/21 0745 09/03/21 0153 09/04/21 0232 09/04/21 0913 09/05/21 0229  WBC 10.9* 10.5 12.4* 12.2* 9.9  HGB 7.8* 7.4* 8.2* 8.0* 7.9*  HCT 24.9* 23.7* 26.3* 24.8* 25.2*  MCV 96.5 97.5 97.4 95.8 98.1  PLT 308 257 317 321 741    Basic Metabolic Panel: Recent Labs  Lab 08/31/21 0351 09/02/21 0745 09/03/21 0153 09/04/21 0232 09/05/21 0229  NA 131* 127* 131* 126* 128*  K 3.9 4.9 4.9 5.6* 5.5*  CL 92* 88* 94* 89* 93*  CO2 28 28 27 27 28   GLUCOSE 276* 189* 189* 159* 158*  BUN 29* 52* 32* 49* 34*  CREATININE 4.34* 6.67* 4.44* 5.69* 4.57*  CALCIUM 7.8* 8.6* 8.4* 8.8* 8.4*    GFR: Estimated Creatinine Clearance: 17.9 mL/min (A) (by C-G formula based on SCr of 4.57 mg/dL (H)).  Scheduled Meds:  alosetron  1 mg Oral BID   amitriptyline  25 mg Oral QHS   amLODipine  5 mg Oral Daily   atorvastatin  40 mg Oral q1800   carvedilol  6.25 mg Oral BID   cyclobenzaprine  10 mg Oral TID   [START ON 09/06/2021] darbepoetin (ARANESP) injection - DIALYSIS  150 mcg Intravenous Q Fri-HD   docusate sodium  100 mg Oral BID   DULoxetine  60 mg Oral  Daily   heparin  5,000 Units Subcutaneous Q8H   hydrALAZINE  50 mg Oral TID   insulin aspart  0-6 Units Subcutaneous TID WC   insulin aspart  5 Units Subcutaneous TID WC   insulin glargine-yfgn  12 Units Subcutaneous QHS   multivitamin  1 tablet Oral QHS   pantoprazole  80 mg Oral Daily   sevelamer carbonate  1,600 mg Oral TID WC   sodium chloride flush  3 mL Intravenous Q12H   sodium zirconium cyclosilicate  5 g Oral BID   Continuous Infusions:  sodium chloride     sodium chloride Stopped (08/29/21 0139)   sodium chloride     DAPTOmycin (CUBICIN) 900 mg in sodium chloride 0.9 % IVPB 900 mg (09/05/21 0104)   methocarbamol (ROBAXIN) IV       LOS: 10 days   Author: Debbe Odea  09/05/2021 1:39 PM

## 2021-09-05 NOTE — Progress Notes (Signed)
[facility] Progress Note   Subjective:  Seen in room - no new symptoms. No CP/dyspnea. Wound vac continues to have bloody output. Doesn't feel ready to dialyze in recliner tomorrow, understands that this will be barrier to discharge. K 5.5 today and already ordered for Lokelma x 2 today, which is perfect.  Objective Vitals:   09/04/21 1459 09/04/21 1624 09/04/21 2050 09/05/21 0617  BP: (!) 115/49 (!) 118/47 121/61 (!) 139/54  Pulse: 76 76 66 66  Resp: 18 18 18 16   Temp: 98.3 F (36.8 C)  98 F (36.7 C) 98 F (36.7 C)  TempSrc: Oral  Oral Oral  SpO2: 96% 95% 97% 100%  Weight:      Height:       Physical Exam General: Well appearing woman, NAD. Room air. Heart: RRR; no murmur Lungs: CTA anteriorly Abdomen:soft, non-tender Extremities: RLE with 1+ edema; L BKA with 1+ stump edema. Wound vac to L hip area Dialysis Access: L AVF + bruit  Additional Objective Labs: Basic Metabolic Panel: Recent Labs  Lab 09/03/21 0153 09/04/21 0232 09/05/21 0229  NA 131* 126* 128*  K 4.9 5.6* 5.5*  CL 94* 89* 93*  CO2 27 27 28   GLUCOSE 189* 159* 158*  BUN 32* 49* 34*  CREATININE 4.44* 5.69* 4.57*  CALCIUM 8.4* 8.8* 8.4*   Liver Function Tests: Recent Labs  Lab 09/02/21 0745 09/03/21 0153 09/04/21 0232  AST 13* 15 13*  ALT <5 <5 <5  ALKPHOS 163* 144* 140*  BILITOT 0.5 0.5 0.6  PROT 6.1* 5.6* 6.4*  ALBUMIN 2.3* 2.1* 2.4*   CBC: Recent Labs  Lab 09/02/21 0745 09/03/21 0153 09/04/21 0232 09/04/21 0913 09/05/21 0229  WBC 10.9* 10.5 12.4* 12.2* 9.9  HGB 7.8* 7.4* 8.2* 8.0* 7.9*  HCT 24.9* 23.7* 26.3* 24.8* 25.2*  MCV 96.5 97.5 97.4 95.8 98.1  PLT 308 257 317 321 238   Blood Culture    Component Value Date/Time   SDES TISSUE 08/28/2021 0917   SPECREQUEST LEFT THIGH TISSUE 08/28/2021 0917   CULT  08/28/2021 0917    MODERATE ENTEROCOCCUS FAECIUM VANCOMYCIN RESISTANT ENTEROCOCCUS ISOLATED NO ANAEROBES ISOLATED Sent to [place] for further  susceptibility testing. Performed at [facility], [place] [address] [place], [place] [address]    REPTSTATUS 09/02/2021 FINAL 08/28/2021 0092   Medications:  sodium chloride     sodium chloride Stopped (08/29/21 0139)   sodium chloride     DAPTOmycin (CUBICIN) 900 mg in sodium chloride 0.9 % IVPB 900 mg (09/05/21 0104)   methocarbamol (ROBAXIN) IV      alosetron  1 mg Oral BID   amitriptyline  25 mg Oral QHS   amLODipine  5 mg Oral Daily   atorvastatin  40 mg Oral q1800   carvedilol  6.25 mg Oral BID   cyclobenzaprine  10 mg Oral TID   [START ON 09/06/2021] darbepoetin (ARANESP) injection - DIALYSIS  150 mcg Intravenous Q Fri-HD   docusate sodium  100 mg Oral BID   DULoxetine  60 mg Oral Daily   heparin  5,000 Units Subcutaneous Q8H   hydrALAZINE  50 mg Oral TID   insulin aspart  0-6 Units Subcutaneous TID WC   insulin aspart  5 Units Subcutaneous TID WC   insulin glargine-yfgn  12 Units Subcutaneous QHS   multivitamin  1 tablet Oral QHS   pantoprazole  80 mg Oral Daily   sevelamer carbonate  1,600 mg Oral TID WC   sodium chloride flush  3 mL Intravenous Q12H   sodium zirconium cyclosilicate  5 g Oral BID    Dialysis Orders: [facility] MWF 4h  400/500  90kg  2/2 bath  P2  Heparin 4000 units IV TIW  LUA AVF   Assessment/Plan: L hip infection: S/p recent L hip surgery on 08/10/21 -> persistent infection. Went to OR 08/28/2021 and 08/31/21 for L hip debridement, wound vac in place. Wound Cx + VRE, on Daptomycin. ESRD - on HD MWF. Serial HD 06/28, 6/29 and 6/30. Volume status improved, now trending back up - may need to consider another "extra" HD. K 5.5 today - next HD tomorrow and Lokelma x 2 today. HTN/ volume: BP high on admission, now improved. Weight up again. Occasional soft BP here, follow trend and will adjust meds as needed.    Anemia of ESRD - Hgb declined post op - 2U PRBCs given 6/29. Hgb 7.9 now. Holding heparin with HD. Continue Aranesp q Friday, having ^ next  dose. Secondary HPTH: CCa in range, PO4 at goal.  Cont binders.  DM2: Insulin per primary Dispo: Will need to dialyze in recliner prior to discharge, refusing for tomorrow "not ready yet."  Veneta Penton, PA-C 09/05/2021, 9:12 AM  [employer]

## 2021-09-05 NOTE — Progress Notes (Signed)
Occupational Therapy Treatment Patient Details Name: [name] MRN: [id number] DOB: 1965-09-17 Today's Date: 09/05/2021   History of present illness 56 y.o. female presented to ED 6/26 from dialysis with increased bloody drainage from L hip wound. 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 s/p hardware removal of left hip with partial resection of tuft tissue and femure that were nonviable on 08/10/21 Discharged home 6/23. underwent L hip debridement and wound vac placement 6/28; +Left intertrochanteric non-union,  Fracture of left distal femur  PMH: hypertension, hyperlipidemia, ESRD on HD MWF, history of left BKA in 2021, depression/anxiety, stroke, tobacco use, T2DM,  insomnia, chronic pain syndrome,   OT comments  Pt with plan to return home, but will not have 24 hour assist as her daughter works. Pt declined attempt to stand or squat pivot on R LE as she did prior to admission. She did demonstrate ability to laterally scoot along EOB with increased time and min guard assist. Pt is familiar with transfer board use and has a one at home as well as a drop arm commode. She insisted on using the features of the hospital bed during bed mobility, but plans to sleep in her regular bed at home, recommended a bed rail. Lengthy discussion with pt regarding car transfers at dialysis center stating they will not assist pt in and out of her car. Pt to speak to friends about meeting her at the dialysis center to assist her using transfer board, but was not able to transfer into her taller SUV with transfer board in the past. Offered to speak to SW about setting up transportation, pt declined. OT continues to recommend SNF for ST rehab prior to returning home. Pt is high risk for readmission if she returns home with inability to get to HD.    Recommendations for follow up therapy are one component of a multi-disciplinary discharge planning process, led by the attending physician.   Recommendations may be updated based on patient status, additional functional criteria and insurance authorization.    Follow Up Recommendations  Skilled nursing-short term rehab (<3 hours/day)    Assistance Recommended at Discharge Intermittent Supervision/Assistance  Patient can return home with the following  A lot of help with walking and/or transfers;A lot of help with bathing/dressing/bathroom;Assistance with cooking/housework;Assist for transportation;Help with stairs or ramp for entrance   Equipment Recommendations  None recommended by OT    Recommendations for Other Services      Precautions / Restrictions Precautions Precautions: Fall;Other (comment) Precaution Comments: L BKA (baseline), L LE wound vac Restrictions Weight Bearing Restrictions: Yes LLE Weight Bearing: Non weight bearing Other Position/Activity Restrictions: L distal femur fx--non union       Mobility Bed Mobility Overal bed mobility: Needs Assistance Bed Mobility: Supine to Sit, Sit to Supine     Supine to sit: Supervision Sit to supine: Min guard   General bed mobility comments: insisted on HOB elevated and use of rail (despite acknowledging she does not have this equipment at home); incr time and effort to come to sit; near need for assist with returning LLE up onto mattress, but ultimately pt was able to use her UEs to assist LLE herself    Transfers Overall transfer level: Needs assistance Equipment used: None              Lateral/Scoot Transfers: Min guard General transfer comment: pt did not have drop-arm chair in room, therefore practiced lateral scoot to left and then right (x 2  feet each way) in preparation for sliding board transfer to/from wheelchair, BSC with drop-arm, and car, pt states she is not able to transfer "uphill" as would be necessary to get back into her car     Balance Overall balance assessment: Needs assistance   Sitting balance-Leahy Scale: Good          Standing balance comment: pt unwilling to attempt to weight bear on R LE                           ADL either performed or assessed with clinical judgement   ADL                                              Extremity/Trunk Assessment     Lower Extremity Assessment RLE Deficits / Details: hip, knee and ankle ROM WFL, hip flex 4/5, knee flex 4/5, knee ext 3+/5, ankle dorsi/plantarflex 4/5 LLE Deficits / Details: s/p BKA (2021). tendency to rest in external rotation and adduction, unable to flex/extend L knee due to fx   Cervical / Trunk Assessment Cervical / Trunk Assessment: Normal    Vision       Perception     Praxis      Cognition Arousal/Alertness: Awake/alert Behavior During Therapy: WFL for tasks assessed/performed Overall Cognitive Status: Impaired/Different from baseline Area of Impairment: Safety/judgement                         Safety/Judgement: Decreased awareness of safety, Decreased awareness of deficits     General Comments: pt with decreased awareness of ability to transfer to and from her SUV for outpatient HD        Exercises      Shoulder Instructions       General Comments      Pertinent Vitals/ Pain       Pain Assessment Pain Assessment: Faces Faces Pain Scale: Hurts little more Pain Location: L hip at drain site; Left knee Pain Descriptors / Indicators: Discomfort, Sore Pain Intervention(s): Monitored during session  Home Living Family/patient expects to be discharged to:: Private residence Living Arrangements: Children Available Help at Discharge: Family;Available PRN/intermittently Type of Home: Mobile home Home Access: Ramped entrance     Home Layout: One level     Bathroom Shower/Tub: Occupational psychologist: Standard Bathroom Accessibility: No   Home Equipment: Tax adviser (4 wheels)          Prior Functioning/Environment               Frequency  Min 2X/week        Progress Toward Goals  OT Goals(current goals can now be found in the care plan section)  Progress towards OT goals: Progressing toward goals  Acute Rehab OT Goals OT Goal Formulation: With patient Time For Goal Achievement: 09/15/21 Potential to Achieve Goals: [place] Discharge plan remains appropriate    Co-evaluation    PT/OT/SLP Co-Evaluation/Treatment: Yes Reason for Co-Treatment: Necessary to address cognition/behavior during functional activity PT goals addressed during session: Mobility/safety with mobility;Balance OT goals addressed during session: ADL's and self-care      AM-PAC OT "6 Clicks" Daily Activity     Outcome Measure   Help from another person eating meals?: None Help from another person taking care  of personal grooming?: A Little Help from another person toileting, which includes using toliet, bedpan, or urinal?: A Lot Help from another person bathing (including washing, rinsing, drying)?: A Lot Help from another person to put on and taking off regular upper body clothing?: A Little Help from another person to put on and taking off regular lower body clothing?: A Lot 6 Click Score: 16    End of Session    OT Visit Diagnosis: Muscle weakness (generalized) (M62.81);Unsteadiness on feet (R26.81);History of falling (Z91.81)   Activity Tolerance Patient tolerated treatment well   Patient Left in bed;with call bell/phone within reach;with bed alarm set   Nurse Communication          Time: 3276-1470 OT Time Calculation (min): 37 min  Charges: OT General Charges $OT Visit: 1 Visit OT Treatments $Self Care/Home Management : 8-22 mins  Cleta Alberts, OTR/L Acute Rehabilitation Services Office: [phone]   [name] 09/05/2021, 3:43 PM

## 2021-09-05 NOTE — TOC Initial Note (Signed)
Transition of Care [facility]) - Initial/Assessment Note    Patient Details  Name: [name] MRN: [id number] Date of Birth: 15-Mar-1965  Transition of Care [facility]) CM/SW Contact:    Milinda Antis, [place] Phone Number: 09/05/2021, 10:33 AM  Clinical Narrative:                 CSW received consult for possible SNF placement at time of discharge. CSW spoke with patient. Patient reported that she lives with a daughter and will go home at discharge.  Patient declined SNF placement.  Per MD patient is not sitting for dialysis as of yet. No further questions reported at this time.      Expected Discharge Plan: Skilled Nursing Facility Barriers to Discharge: Continued Medical Work up   Patient Goals and CMS Choice Patient states their goals for this hospitalization and ongoing recovery are:: To return home CMS Medicare.gov Compare Post Acute Care list provided to:: Patient Choice offered to / list presented to : NA  Expected Discharge Plan and Services Expected Discharge Plan: [place]   Discharge Planning Services: CM Consult   Living arrangements for the past 2 months: Single Family Home                                      Prior Living Arrangements/Services Living arrangements for the past 2 months: Single Family Home Lives with:: Adult Children Patient language and need for interpreter reviewed:: Yes Do you feel safe going back to the place where you live?: Yes      Need for Family Participation in Patient Care: Yes (Comment) Care giver support system in place?: Yes (comment) Current home services: DME ([employer], w/c) Criminal Activity/Legal Involvement Pertinent to Current Situation/Hospitalization: No - Comment as needed  Activities of Daily Living Home Assistive Devices/Equipment: Eyeglasses, Wheelchair ADL Screening (condition at time of admission) Patient's cognitive ability adequate to safely complete daily activities?: Yes Is the patient deaf  or have difficulty hearing?: No Does the patient have difficulty seeing, even when wearing glasses/contacts?: No Does the patient have difficulty concentrating, remembering, or making decisions?: No Patient able to express need for assistance with ADLs?: Yes Does the patient have difficulty dressing or bathing?: Yes Independently performs ADLs?: No Communication: Independent Dressing (OT): Independent Is this a change from baseline?: Pre-admission baseline Grooming: Independent Feeding: Independent Bathing: Needs assistance Is this a change from baseline?: Pre-admission baseline Toileting: Needs assistance Is this a change from baseline?: Pre-admission baseline In/Out Bed: Needs assistance, Dependent Is this a change from baseline?: Pre-admission baseline Walks in Home: Dependent Is this a change from baseline?: Pre-admission baseline Does the patient have difficulty walking or climbing stairs?: Yes Weakness of Legs: Both Weakness of Arms/Hands: Both  Permission Sought/Granted Permission sought to share information with : Case Manager, Family Supports Permission granted to share information with : Yes, Verbal Permission Granted              Emotional Assessment Appearance:: Appears stated age Attitude/Demeanor/Rapport: Engaged, Gracious Affect (typically observed): Accepting, Appropriate, Calm, Hopeful Orientation: : Oriented to Self, Oriented to Place, Oriented to  Time, Oriented to Situation Alcohol / Substance Use: Not Applicable Psych Involvement: No (comment)  Admission diagnosis:  Wound infection [T14.8XXA, L08.9] Abscess of left hip [L02.416] Patient Active Problem List   Diagnosis Date Noted   Wound dehiscence, surgical, sequela    Abscess of left hip 08/26/2021   History  of CVA (cerebrovascular accident) 08/26/2021   Right knee pain 08/26/2021   Open toe wound of right 5th toe  78/58/8502   Hardware complicating wound infection ([place])    Subacute osteomyelitis  of left femur with abscess     Septic arthritis of hip ([place]) 07/30/2021   Skin abscess L hip 07/28/2021   HCAP (healthcare-associated pneumonia) 07/27/2021   Hyperkalemia 77/41/2878   Metabolic acidemia 67/67/2094   Closed fracture of left distal femur ([place]) 07/22/2021   Uncontrolled type 2 diabetes mellitus with hypoglycemia, with long-term current use of insulin ([place]) 07/22/2021   Fracture of femoral condyle, left, closed ([place]) 07/21/2021   Infection of superficial incisional surgical site after procedure 03/09/2020   Iron deficiency anemia, unspecified 02/21/2020   Abscess    Hypoglycemia    Benign essential HTN    Sleep disturbance    ESRD (end stage renal disease) on dialysis ([place]) 01/24/2020   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[place]) 01/23/2020   Hyponatremia    Constipation    Chronic osteomyelitis involving left ankle and foot ([place])    Ulcer of left foot with necrosis of bone ([place])    Wound infection 12/28/2019   History of [place] amputation of left foot ([place]) 12/25/2017   Dyslipidemia, goal LDL below 100 07/04/2017   History of CVA (cerebrovascular accident) without residual deficits 07/04/2017   Cerebral thrombosis with cerebral infarction 04/08/2017   Right sided weakness 04/07/2017   Hyperhidrosis 09/01/2016   Migraine with aura and without status migrainosus, not intractable 07/28/2016   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[place]) 08/04/2014   CKD stage 3 due to type 2 diabetes mellitus ([place]) 06/27/2014   Vitamin D insufficiency 05/08/2014   Obesity (BMI 30.0-34.9) 05/08/2014   Unilateral amputation of left foot ([place]) 05/08/2014   Bursitis of left shoulder 02/14/2014   Neck pain 01/03/2014   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.23) 01/14/2013   Insomnia 08/04/2012   Diabetic neuropathy, painful ([place]) 08/04/2011   Anxiety and depression 05/16/2010   Female stress incontinence 11/01/2007   TOBACCO DEPENDENCE  04/30/2006   GASTROESOPHAGEAL REFLUX, NO ESOPHAGITIS 04/30/2006   Irritable bowel syndrome 04/30/2006   PCP:  [name], MD Pharmacy:   [facility], [place] [address] [place] [address] Phone: [phone] Fax: [phone]  [id number] - [place], [place] [address] [place] [address] Phone: [phone] Fax: [phone]     Social Determinants of Health (SDOH) Interventions    Readmission Risk Interventions    07/26/2021    3:15 PM  Readmission Risk Prevention Plan  Transportation Screening Complete  PCP or Specialist Appt within 3-5 Days Complete  HRI or [place] Complete  Palliative Care Screening Not Applicable  Medication Review (RN Care Manager) Referral to Pharmacy

## 2021-09-06 DIAGNOSIS — E875 Hyperkalemia: Secondary | ICD-10-CM | POA: Diagnosis not present

## 2021-09-06 DIAGNOSIS — M86252 Subacute osteomyelitis, left femur: Secondary | ICD-10-CM | POA: Diagnosis not present

## 2021-09-06 LAB — CBC
HCT: 24.2 % — ABNORMAL LOW (ref 36.0–46.0)
Hemoglobin: 7.6 g/dL — ABNORMAL LOW (ref 12.0–15.0)
MCH: 30.4 pg (ref 26.0–34.0)
MCHC: 31.4 g/dL (ref 30.0–36.0)
MCV: 96.8 fL (ref 80.0–100.0)
Platelets: 224 10*3/uL (ref 150–400)
RBC: 2.5 MIL/uL — ABNORMAL LOW (ref 3.87–5.11)
RDW: 16.5 % — ABNORMAL HIGH (ref 11.5–15.5)
WBC: 8.4 10*3/uL (ref 4.0–10.5)
nRBC: 0 % (ref 0.0–0.2)

## 2021-09-06 LAB — RENAL FUNCTION PANEL
Albumin: 2.2 g/dL — ABNORMAL LOW (ref 3.5–5.0)
Anion gap: 11 (ref 5–15)
BUN: 46 mg/dL — ABNORMAL HIGH (ref 6–20)
CO2: 26 mmol/L (ref 22–32)
Calcium: 8.6 mg/dL — ABNORMAL LOW (ref 8.9–10.3)
Chloride: 89 mmol/L — ABNORMAL LOW (ref 98–111)
Creatinine, Ser: 5.5 mg/dL — ABNORMAL HIGH (ref 0.44–1.00)
GFR, Estimated: 9 mL/min — ABNORMAL LOW (ref >60)
Glucose, Bld: 155 mg/dL — ABNORMAL HIGH (ref 70–99)
Phosphorus: 4.4 mg/dL (ref 2.5–4.6)
Potassium: 5.6 mmol/L — ABNORMAL HIGH (ref 3.5–5.1)
Sodium: 126 mmol/L — ABNORMAL LOW (ref 135–145)

## 2021-09-06 LAB — GLUCOSE, CAPILLARY
Glucose-Capillary: 176 mg/dL — ABNORMAL HIGH (ref 70–99)
Glucose-Capillary: 119 mg/dL — ABNORMAL HIGH (ref 70–99)
Glucose-Capillary: 208 mg/dL — ABNORMAL HIGH (ref 70–99)

## 2021-09-06 LAB — CK: Total CK: 23 U/L — ABNORMAL LOW (ref 38–234)

## 2021-09-06 MED ORDER — SODIUM ZIRCONIUM CYCLOSILICATE 10 G PO PACK
10.0000 g | PACK | Freq: Two times a day (BID) | ORAL | Status: DC
  Administered 2021-09-06 – 2021-09-08 (×4): 10 g via ORAL
  Filled 2021-09-06 (×4): qty 1

## 2021-09-06 NOTE — Progress Notes (Signed)
[name] Progress Note   Subjective:  Seen in room. No new complaints.Denies cp/dyspnea. Working with PT- goal is to get into recliner for dialysis. She says "it depends on this leg"  Objective Vitals:   09/05/21 0935 09/05/21 1621 09/05/21 2146 09/06/21 0558  BP: (!) 111/58 119/71 136/62 (!) 151/59  Pulse: 71 (!) 53 70 70  Resp: 19 17 18 18   Temp: 98.5 F (36.9 C) 98.3 F (36.8 C) 98 F (36.7 C) 98 F (36.7 C)  TempSrc: Oral  Oral   SpO2: 97% (!) 77% 98% 100%  Weight:      Height:       Physical Exam General: Well appearing woman, NAD. Room air. Heart: RRR; no murmur Lungs: CTA anteriorly Abdomen:soft, non-tender Extremities: RLE with 1+ edema; L BKA with 1+ stump edema. Wound vac to L hip area Dialysis Access: L AVF + bruit  Additional Objective Labs: Basic Metabolic Panel: Recent Labs  Lab 09/04/21 0232 09/05/21 0229 09/06/21 0549  NA 126* 128* 126*  K 5.6* 5.5* 5.6*  CL 89* 93* 89*  CO2 27 28 26   GLUCOSE 159* 158* 155*  BUN 49* 34* 46*  CREATININE 5.69* 4.57* 5.50*  CALCIUM 8.8* 8.4* 8.6*  PHOS  --   --  4.4    Liver Function Tests: Recent Labs  Lab 09/02/21 0745 09/03/21 0153 09/04/21 0232 09/06/21 0549  AST 13* 15 13*  --   ALT <5 <5 <5  --   ALKPHOS 163* 144* 140*  --   BILITOT 0.5 0.5 0.6  --   PROT 6.1* 5.6* 6.4*  --   ALBUMIN 2.3* 2.1* 2.4* 2.2*    CBC: Recent Labs  Lab 09/02/21 0745 09/03/21 0153 09/04/21 0232 09/04/21 0913 09/05/21 0229  WBC 10.9* 10.5 12.4* 12.2* 9.9  HGB 7.8* 7.4* 8.2* 8.0* 7.9*  HCT 24.9* 23.7* 26.3* 24.8* 25.2*  MCV 96.5 97.5 97.4 95.8 98.1  PLT 308 257 317 321 238    Blood Culture    Component Value Date/Time   SDES TISSUE 08/28/2021 0917   SPECREQUEST LEFT THIGH TISSUE 08/28/2021 0917   CULT  08/28/2021 0917    MODERATE ENTEROCOCCUS FAECIUM VANCOMYCIN RESISTANT ENTEROCOCCUS ISOLATED NO ANAEROBES ISOLATED Sent to [facility] for further susceptibility testing. Performed at [facility], [place] [address] [place], [place] [address]    REPTSTATUS 09/02/2021 FINAL 08/28/2021 1062   Medications:  sodium chloride     sodium chloride Stopped (08/29/21 0139)   sodium chloride     DAPTOmycin (CUBICIN) 900 mg in sodium chloride 0.9 % IVPB 900 mg (09/05/21 0104)   methocarbamol (ROBAXIN) IV      alosetron  1 mg Oral BID   amitriptyline  25 mg Oral QHS   amLODipine  5 mg Oral Daily   atorvastatin  40 mg Oral q1800   carvedilol  6.25 mg Oral BID   cyclobenzaprine  10 mg Oral TID   darbepoetin (ARANESP) injection - DIALYSIS  150 mcg Intravenous Q Fri-HD   docusate sodium  100 mg Oral BID   DULoxetine  60 mg Oral Daily   heparin  5,000 Units Subcutaneous Q8H   hydrALAZINE  50 mg Oral TID   insulin aspart  0-6 Units Subcutaneous TID WC   insulin aspart  5 Units Subcutaneous TID WC   insulin glargine-yfgn  12 Units Subcutaneous QHS   multivitamin  1 tablet Oral QHS   pantoprazole  80 mg Oral Daily   sevelamer carbonate  1,600  mg Oral TID WC   sodium chloride flush  3 mL Intravenous Q12H   sodium zirconium cyclosilicate  5 g Oral BID    Dialysis Orders: [facility] MWF 4h  400/500  90kg  2/2 bath  P2  Heparin 4000 units IV TIW  LUA AVF   Assessment/Plan: L hip infection: S/p recent L hip surgery on 08/10/21 -> persistent infection. Went to OR 08/28/2021 and 08/31/21 for L hip debridement, wound vac in place. Wound Cx + VRE, on Daptomycin. ESRD - on HD MWF. Serial HD 06/28, 6/29 and 6/30. Volume status improved, now trending back up. K 5.6 - HD today  HTN/ volume: BP high on admission, now improved. Weight up again. Occasional soft BP here, follow trend and will adjust meds as needed.    Anemia of ESRD - Hgb declined post op - 2U PRBCs given 6/29. Hgb 7.9 now. Holding heparin with HD. Continue Aranesp q Friday. 150 mcg ordered for 7/7. Secondary HPTH: CCa in range, PO4 at goal.  Cont binders.  DM2: Insulin per primary Dispo: Will need to dialyze in recliner prior to  discharge, refusing for now "not ready yet."  Lynnda Child PA-C [facility] 09/06/2021,8:35 AM

## 2021-09-06 NOTE — Progress Notes (Addendum)
Physical Therapy Treatment Patient Details Name: [name] MRN: [id number] DOB: 28-Dec-1965 Today's Date: 09/06/2021   History of Present Illness 56 y.o. female presented to ED 6/26 from dialysis with increased bloody drainage from L hip wound. 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 s/p hardware removal of left hip with partial resection of tuft tissue and femure that were nonviable on 08/10/21 Discharged home 6/23. underwent L hip debridement and wound vac placement 6/28; +Left intertrochanteric non-union,  Fracture of left distal femur  PMH: hypertension, hyperlipidemia, ESRD on HD MWF, history of left BKA in 2021, depression/anxiety, stroke, tobacco use, T2DM,  insomnia, chronic pain syndrome,    PT Comments    Pt supine in bed, just finished breakfast. PT/OT session due to impending transport to dialysis and pt historically refusing therapy after HD. Had very frank discussion with pt about need to start demonstrating her mobility and ability to tolerate HD in recliner or she will need to go to SNF. Despite maximal encouragement pt refuses to have HD in recliner today. Verbalized agreement to get up to HD recliner by next Wednesday.  Pt agrees to participate in limited mobility before HD. Pt is supervision for bed mobility and mod Ax2 with very elevated bed to come to standing EoB. Performed limited AAROM on L knee at end of session before transport arrived. D/c plan remains appropriate at this time. Will continue to work with acute PT towards goal of home with safe car transfers to get to HD.   Recommendations for follow up therapy are one component of a multi-disciplinary discharge planning process, led by the attending physician.  Recommendations may be updated based on patient status, additional functional criteria and insurance authorization.  Follow Up Recommendations  Skilled nursing-short term rehab (<3 hours/day) (however pt refusing and will need max  HH services; may need transportation to/from HD in wheelchair) Can patient physically be transported by private vehicle: No   Assistance Recommended at Discharge Intermittent Supervision/Assistance  Patient can return home with the following A lot of help with walking and/or transfers;A lot of help with bathing/dressing/bathroom;Assistance with cooking/housework;Assist for transportation;Help with stairs or ramp for entrance   Equipment Recommendations  None recommended by PT (?will need transportation to/from HD in wheelchair (pt trying to arrange to have a friend assist her in/out of her car))       Precautions / Restrictions Precautions Precautions: Fall;Other (comment) Precaution Comments: L BKA (baseline), L LE wound vac Restrictions Weight Bearing Restrictions: Yes LLE Weight Bearing: Non weight bearing Other Position/Activity Restrictions: L distal femur fx--non union     Mobility  Bed Mobility Overal bed mobility: Needs Assistance Bed Mobility: Rolling, Supine to Sit, Sit to Supine Rolling: Modified independent (Device/Increase time) (use of rails)   Supine to sit: Supervision Sit to supine: Supervision   General bed mobility comments: HOB up, use of rail, increased time    Transfers Overall transfer level: Needs assistance Equipment used: Rolling walker (2 wheels) Transfers: Sit to/from Stand Sit to Stand: +2 physical assistance, Mod assist           General transfer comment: pt stood x 2 from EOB with RW and +2 mod assist using bed pad under hips, simulated lateral scoot along EOB with min guard assist        Balance Overall balance assessment: Needs assistance   Sitting balance-Leahy Scale: Good     Standing balance support: Bilateral upper extremity supported, During functional activity, Reliant on assistive device  for balance Standing balance-Leahy Scale: Poor Standing balance comment: Pt willing to attempt stand this visit.                             Cognition Arousal/Alertness: Awake/alert Behavior During Therapy: WFL for tasks assessed/performed Overall Cognitive Status: Within Functional Limits for tasks assessed                                 General Comments: pt more realistic with need to work with therapy to progress her home and able to get to HD           General Comments General comments (skin integrity, edema, etc.): VSS on RA      Pertinent Vitals/Pain Pain Assessment Pain Assessment: Faces Faces Pain Scale: Hurts little more Pain Location: L LE Pain Descriptors / Indicators: Discomfort, Sore Pain Intervention(s): Limited activity within patient's tolerance, Monitored during session, Repositioned     PT Goals (current goals can now be found in the care plan section) Acute Rehab PT Goals Time For Goal Achievement: 09/10/21 Potential to Achieve Goals: Fair    Frequency    Min 3X/week      PT Plan Current plan remains appropriate    Co-evaluation PT/OT/SLP Co-Evaluation/Treatment: Yes Reason for Co-Treatment: For patient/therapist safety PT goals addressed during session: Mobility/safety with mobility        AM-PAC PT "6 Clicks" Mobility   Outcome Measure  Help needed turning from your back to your side while in a flat bed without using bedrails?: A Little Help needed moving from lying on your back to sitting on the side of a flat bed without using bedrails?: A Little Help needed moving to and from a bed to a chair (including a wheelchair)?: Total Help needed standing up from a chair using your arms (e.g., wheelchair or bedside chair)?: Total Help needed to walk in hospital room?: Total Help needed climbing 3-5 steps with a railing? : Total 6 Click Score: 10    End of Session Equipment Utilized During Treatment: Gait belt Activity Tolerance: Patient tolerated treatment well Patient left: in bed;with call bell/phone within reach;with bed alarm set Nurse  Communication: Mobility status PT Visit Diagnosis: Other abnormalities of gait and mobility (R26.89);Muscle weakness (generalized) (M62.81);History of falling (Z91.81);Pain Pain - Right/Left: Left Pain - part of body: Leg     Time: 9604-5409 PT Time Calculation (min) (ACUTE ONLY): 28 min  Charges:  $Therapeutic Activity: 8-22 mins                     Lucindia Lemley B. [name] PT, DPT Acute Rehabilitation Services Please use secure chat or  Call Office [phone]    [place] 09/06/2021, 12:46 PM

## 2021-09-06 NOTE — Progress Notes (Signed)
Occupational Therapy Treatment Patient Details Name: [name] MRN: [id number] DOB: 10/07/1965 Today's Date: 09/06/2021   History of present illness 56 y.o. female presented to ED 6/26 from dialysis with increased bloody drainage from L hip wound. 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 s/p hardware removal of left hip with partial resection of tuft tissue and femure that were nonviable on 08/10/21 Discharged home 6/23. underwent L hip debridement and wound vac placement 6/28; +Left intertrochanteric non-union,  Fracture of left distal femur  PMH: hypertension, hyperlipidemia, ESRD on HD MWF, history of left BKA in 2021, depression/anxiety, stroke, tobacco use, T2DM,  insomnia, chronic pain syndrome,   OT comments  Pt with improved understanding of skills she must attain for safe discharge home. Worked on sit<>stand from elevated bed with RW with +2 mod assist and lateral scoot along EOB with min guard assist.  Pt declined transfer to dialysis recliner, but is willing to attempt next visit with pressure relief cushion. Pt reports the dialysis recliner does not have a drop arm at her center, will need to be able to do squat pivot independently. Continue to recommend SNF until pt can transfer independently.    Recommendations for follow up therapy are one component of a multi-disciplinary discharge planning process, led by the attending physician.  Recommendations may be updated based on patient status, additional functional criteria and insurance authorization.    Follow Up Recommendations  Skilled nursing-short term rehab (<3 hours/day)    Assistance Recommended at Discharge Intermittent Supervision/Assistance  Patient can return home with the following  A lot of help with bathing/dressing/bathroom;Assistance with cooking/housework;Assist for transportation;Help with stairs or ramp for entrance;Two people to help with walking and/or transfers   Equipment  Recommendations  None recommended by OT    Recommendations for Other Services      Precautions / Restrictions Precautions Precautions: Fall;Other (comment) Precaution Comments: L BKA (baseline), L LE wound vac Restrictions Weight Bearing Restrictions: Yes LLE Weight Bearing: Non weight bearing Other Position/Activity Restrictions: L distal femur fx--non union       Mobility Bed Mobility Overal bed mobility: Needs Assistance Bed Mobility: Rolling, Supine to Sit, Sit to Supine Rolling: Modified independent (Device/Increase time) (use of rails)   Supine to sit: Supervision Sit to supine: Supervision   General bed mobility comments: HOB up, use of rail, increased time    Transfers Overall transfer level: Needs assistance Equipment used: Rolling walker (2 wheels) Transfers: Sit to/from Stand Sit to Stand: +2 physical assistance, Mod assist           General transfer comment: pt stood x 2 from EOB with RW and +2 mod assist using bed pad under hips, simulated lateral scoot along EOB with min guard assist     Balance Overall balance assessment: Needs assistance   Sitting balance-Leahy Scale: Good     Standing balance support: Bilateral upper extremity supported, During functional activity, Reliant on assistive device for balance Standing balance-Leahy Scale: Poor Standing balance comment: Pt willing to attempt stand this visit.                           ADL either performed or assessed with clinical judgement   ADL Overall ADL's : Needs assistance/impaired       Grooming Details (indicate cue type and reason): applied lotion to L LE seated EOB with set up  Toileting- Clothing Manipulation and Hygiene: Total assistance;Bed level              Extremity/Trunk Assessment              Vision       Perception     Praxis      Cognition Arousal/Alertness: Awake/alert Behavior During Therapy: WFL for tasks  assessed/performed Overall Cognitive Status: Within Functional Limits for tasks assessed                                 General Comments: pt more realistic with need to work with therapy to progress her home and able to get to HD        Exercises      Shoulder Instructions       General Comments      Pertinent Vitals/ Pain       Pain Assessment Pain Assessment: Faces Faces Pain Scale: Hurts little more Pain Location: L LE Pain Descriptors / Indicators: Discomfort, Sore Pain Intervention(s): Repositioned, Monitored during session  Home Living                                          Prior Functioning/Environment              Frequency  Min 2X/week        Progress Toward Goals  OT Goals(current goals can now be found in the care plan section)  Progress towards OT goals: Progressing toward goals  Acute Rehab OT Goals OT Goal Formulation: With patient Time For Goal Achievement: 09/15/21 Potential to Achieve Goals: [place] Discharge plan remains appropriate    Co-evaluation    PT/OT/SLP Co-Evaluation/Treatment: Yes Reason for Co-Treatment: For patient/therapist safety;Necessary to address cognition/behavior during functional activity   OT goals addressed during session: Strengthening/ROM      AM-PAC OT "6 Clicks" Daily Activity     Outcome Measure   Help from another person eating meals?: None Help from another person taking care of personal grooming?: A Little Help from another person toileting, which includes using toliet, bedpan, or urinal?: Total Help from another person bathing (including washing, rinsing, drying)?: A Lot Help from another person to put on and taking off regular upper body clothing?: A Little Help from another person to put on and taking off regular lower body clothing?: A Lot 6 Click Score: 15    End of Session Equipment Utilized During Treatment: Rolling walker (2 wheels)  OT Visit  Diagnosis: Muscle weakness (generalized) (M62.81);Unsteadiness on feet (R26.81);History of falling (Z91.81)   Activity Tolerance Patient tolerated treatment well   Patient Left in bed;with call bell/phone within reach   Nurse Communication          Time: 7062-3762 OT Time Calculation (min): 27 min  Charges: OT General Charges $OT Visit: 1 Visit OT Treatments $Therapeutic Activity: 8-22 mins  Cleta Alberts, OTR/L Acute Rehabilitation Services Office: [phone]  [name] 09/06/2021, 8:55 AM

## 2021-09-06 NOTE — Progress Notes (Addendum)
Having issues with her LUE AVF, poor BFR which is likely why her K remains elevated.  Will re-consult vascular for another evaluation and possible intervention.

## 2021-09-06 NOTE — Consult Note (Cosign Needed)
[name] NOTE   MRN : [id number]  Reason for Consult: Difficult cannulation, poor flow L UE Fistula Referring Physician: Dr. Marval Regal   History of Present Illness: 56 y/o female with left UE BC fistula.  The fistula was created in 2021 and has been used for the past 9 months.  She was having difficulty with cannulation prior to this and under went  fistulogram.  This revealed   Per Dr. Mora Appl OPERATIVE FINDINGS: healthy fistula without flow limiting stenosis. Large proximal sidebranch prohibits refluxing of contrast across the anastomosis. No technical explanation for difficulty with cannulation.  We have been ask to re evaluate the fistula for difficulty with sticks.  The patient states some people have trouble and some don't have problems.     Current Facility-Administered Medications  Medication Dose Route Frequency Provider Last Rate Last Admin   0.9 %  sodium chloride infusion  250 mL Intravenous PRN Newt Minion, MD       0.9 %  sodium chloride infusion   Intravenous Continuous Newt Minion, MD   Stopped at 08/29/21 0139   0.9 %  sodium chloride infusion   Intravenous Continuous Newt Minion, MD       acetaminophen (TYLENOL) tablet 325-650 mg  325-650 mg Oral Q6H PRN Newt Minion, MD       alosetron (LOTRONEX) tablet 1 mg  1 mg Oral BID Newt Minion, MD   1 mg at 09/05/21 2222   amitriptyline (ELAVIL) tablet 25 mg  25 mg Oral QHS Newt Minion, MD   25 mg at 09/05/21 2222   amLODipine (NORVASC) tablet 5 mg  5 mg Oral Daily Newt Minion, MD   5 mg at 09/05/21 1726   atorvastatin (LIPITOR) tablet 40 mg  40 mg Oral q1800 Newt Minion, MD   40 mg at 09/05/21 1726   bisacodyl (DULCOLAX) suppository 10 mg  10 mg Rectal Daily PRN Newt Minion, MD       calcium carbonate (TUMS - dosed in mg elemental calcium) chewable tablet 200 mg of elemental calcium  1 tablet Oral Q2H PRN Shalhoub, Sherryll Burger, MD   200 mg of elemental calcium at  09/06/21 0807   carvedilol (COREG) tablet 6.25 mg  6.25 mg Oral BID Newt Minion, MD   6.25 mg at 09/05/21 2222   cyclobenzaprine (FLEXERIL) tablet 10 mg  10 mg Oral TID Newt Minion, MD   10 mg at 09/05/21 2222   DAPTOmycin (CUBICIN) 900 mg in sodium chloride 0.9 % IVPB  10 mg/kg Intravenous Once per day on Mon Wed Fri Laurice Record, MD 136 mL/hr at 09/05/21 0104 900 mg at 09/05/21 0104   Darbepoetin Alfa (ARANESP) injection 150 mcg  150 mcg Intravenous Q Fri-HD Loren Racer, PA-C   150 mcg at 09/06/21 1048   diphenoxylate-atropine (LOMOTIL) 2.5-0.025 MG per tablet 1 tablet  1 tablet Oral Q12H PRN Newt Minion, MD       docusate sodium (COLACE) capsule 100 mg  100 mg Oral BID Newt Minion, MD   100 mg at 09/05/21 2222   DULoxetine (CYMBALTA) DR capsule 60 mg  60 mg Oral Daily Newt Minion, MD   60 mg at 09/05/21 0843   heparin injection 5,000 Units  5,000 Units Subcutaneous Q8H Newt Minion, MD   5,000 Units at 09/06/21 0601   hydrALAZINE (APRESOLINE) tablet 50 mg  50 mg Oral TID [name],  Illene Regulus, MD   50 mg at 09/05/21 2222   insulin aspart (novoLOG) injection 0-6 Units  0-6 Units Subcutaneous TID WC Newt Minion, MD   1 Units at 09/06/21 3664   insulin aspart (novoLOG) injection 5 Units  5 Units Subcutaneous TID WC Donne Hazel, MD   5 Units at 09/06/21 4034   insulin glargine-yfgn (SEMGLEE) injection 12 Units  12 Units Subcutaneous QHS Donne Hazel, MD   12 Units at 09/05/21 2337   methocarbamol (ROBAXIN) tablet 500 mg  500 mg Oral Q6H PRN Newt Minion, MD   500 mg at 09/05/21 2027   Or   methocarbamol (ROBAXIN) 500 mg in dextrose 5 % 50 mL IVPB  500 mg Intravenous Q6H PRN Newt Minion, MD       multivitamin (RENA-VIT) tablet 1 tablet  1 tablet Oral QHS Newt Minion, MD   1 tablet at 09/05/21 2222   ondansetron (ZOFRAN) tablet 4 mg  4 mg Oral Q6H PRN Newt Minion, MD       Or   ondansetron [facility]) injection 4 mg  4 mg Intravenous Q6H PRN Newt Minion, MD   4  mg at 09/06/21 1047   oxyCODONE (Oxy IR/ROXICODONE) immediate release tablet 10-15 mg  10-15 mg Oral Q3H PRN Newt Minion, MD   15 mg at 09/06/21 1514   oxyCODONE (Oxy IR/ROXICODONE) immediate release tablet 5-10 mg  5-10 mg Oral Q3H PRN Newt Minion, MD   5 mg at 09/04/21 2021   pantoprazole (PROTONIX) EC tablet 80 mg  80 mg Oral Daily Newt Minion, MD   80 mg at 09/06/21 7425   polyethylene glycol (MIRALAX / GLYCOLAX) packet 17 g  17 g Oral Daily PRN Newt Minion, MD       sevelamer carbonate (RENVELA) tablet 1,600 mg  1,600 mg Oral TID WC Newt Minion, MD   1,600 mg at 09/06/21 9563   sodium chloride flush (NS) 0.9 % injection 3 mL  3 mL Intravenous Q12H Newt Minion, MD   3 mL at 09/05/21 2227   sodium chloride flush (NS) 0.9 % injection 3 mL  3 mL Intravenous PRN Newt Minion, MD       sodium zirconium cyclosilicate (LOKELMA) packet 10 g  10 g Oral BID Donato Heinz, MD        Pt meds include: Statin :Yes Betablocker: No ASA: No Other anticoagulants/antiplatelets: none   Past Medical History:  Diagnosis Date   Anxiety 2002   Chest tightness    Chronic kidney disease    Depression 2001   Diabetes type 1, uncontrolled    at age 27   Diabetic neuropathy ([place])    Essential hypertension 2015   GERD (gastroesophageal reflux disease)    about age of 24   Headache    Nausea and vomiting in adult    Stroke [facility])    Urinary frequency    Yeast vaginitis     Past Surgical History:  Procedure Laterality Date   A/V FISTULAGRAM Left 08/22/2021   Procedure: A/V Fistulagram;  Surgeon: Cherre Robins, MD;  Location: [place] [facility];  Service: Cardiovascular;  Laterality: Left;   ACHILLES TENDON SURGERY Left 03/10/2013   Procedure: LEFT CHOPART AMPUTATION/ LEFT TENDON ACHILLES [place];  Surgeon: Wylene Simmer, MD;  Location: [place];  Service: Orthopedics;  Laterality: Left;   AMPUTATION Left 06/15/2012   Procedure: AMPUTATION DIGIT;  Surgeon: Meredith Pel,  MD;   Location: [place];  Service: Orthopedics;  Laterality: Left;  Left great toe revision amputation   AMPUTATION Left 01/12/2020   Procedure: AMPUTATION BELOW KNEE;  Surgeon: Wylene Simmer, MD;  Location: [place];  Service: Orthopedics;  Laterality: Left;   APPLICATION OF WOUND VAC  08/28/2021   Procedure: APPLICATION OF WOUND VAC;  Surgeon: Newt Minion, MD;  Location: [place];  Service: Orthopedics;;   AV FISTULA PLACEMENT Left 01/09/2020   Procedure: LEFT UPPER ARM ARTERIOVENOUS (AV) FISTULA CREATION;  Surgeon: Cherre Robins, MD;  Location: [place];  Service: Vascular;  Laterality: Left;   ENDOMETRIAL ABLATION     HARDWARE REMOVAL Left 08/10/2021   Procedure: LEFT HIP REMOVAL NAIL;  Surgeon: Newt Minion, MD;  Location: [place];  Service: Orthopedics;  Laterality: Left;   I & D EXTREMITY Left 01/19/2020   Procedure: IRRIGATION AND DEBRIDEMENT Left Hip;  Surgeon: Rod Can, MD;  Location: [place];  Service: Orthopedics;  Laterality: Left;   I & D EXTREMITY Left 08/28/2021   Procedure: LEFT HIP DEBRIDEMENT;  Surgeon: Newt Minion, MD;  Location: [place];  Service: Orthopedics;  Laterality: Left;   I & D EXTREMITY Left 08/31/2021   Procedure: REPEAT DEBRIDEMENT LEFT HIP;  Surgeon: Newt Minion, MD;  Location: [place];  Service: Orthopedics;  Laterality: Left;   INSERTION OF DIALYSIS CATHETER Right 01/09/2020   Procedure: INSERTION OF DIALYSIS CATHETER;  Surgeon: Cherre Robins, MD;  Location: [place];  Service: Vascular;  Laterality: Right;   INTRAMEDULLARY (IM) NAIL INTERTROCHANTERIC Left 12/29/2019   Procedure: INTRAMEDULLARY (IM) NAIL INTERTROCHANTRIC;  Surgeon: Rod Can, MD;  Location: [facility];  Service: Orthopedics;  Laterality: Left;   IR ANGIOGRAM EXTREMITY LEFT  12/17/2018   IR FEM POP ART ATHERECT INC PTA MOD SED  12/17/2018   IR RADIOLOGIST EVAL & MGMT  11/30/2018   IR RADIOLOGIST EVAL & MGMT  12/09/2018   IR RADIOLOGIST EVAL & MGMT  02/08/2019   IR US GUIDE VASC ACCESS RIGHT  12/17/2018    LAPAROSCOPIC CHOLECYSTECTOMY  01/30/2015   [facility]     Social History Social History   Tobacco Use   Smoking status: Light Smoker    Packs/day: 0.50    Years: 22.00    Total pack years: 11.00    Types: Cigarettes   Smokeless tobacco: Never  Vaping Use   Vaping Use: Never used  Substance Use Topics   Alcohol use: Yes    Comment: social   Drug use: No    Family History Family History  Problem Relation Age of Onset   Diabetes Mother    Heart attack Mother    Heart disease Mother        before age 11   Hypertension Mother    Hyperlipidemia Mother    Diabetes Father    Heart attack Father    Heart disease Father        before age 28   Diabetes Sister    Hypertension Sister     Allergies  Allergen Reactions   Trazodone Swelling   Latex Rash   Lidocaine Itching     REVIEW OF SYSTEMS  General: [ ]  Weight loss, [ ]  Fever, [ ]  chills Neurologic: [ ]  Dizziness, [ ]  Blackouts, [ ]  Seizure [ ]  Stroke, [ ]  "Mini stroke", [ ]  Slurred speech, [ ]  Temporary blindness; [ ]  weakness in arms or legs, [ ]  Hoarseness [ ]  Dysphagia Cardiac: [ ]   Chest pain/pressure, [ ]  Shortness of breath at rest [ ]  Shortness of breath with exertion, [ ]  Atrial fibrillation or irregular heartbeat  Vascular: [ ]  Pain in legs with walking, [ ]  Pain in legs at rest, [ ]  Pain in legs at night,  [ ]  Non-healing ulcer, [ ]  Blood clot in vein/DVT,   Pulmonary: [ ]  Home oxygen, [ ]  Productive cough, [ ]  Coughing up blood, [ ]  Asthma,  [ ]  Wheezing [ ]  COPD Musculoskeletal:  [ ]  Arthritis, [ ]  Low back pain, [ ]  Joint pain Hematologic: [ ]  Easy Bruising, [ ]  Anemia; [ ]  Hepatitis Gastrointestinal: [ ]  Blood in stool, [ ]  Gastroesophageal Reflux/heartburn, Urinary: [ ]  chronic Kidney disease, [ x] on HD - [ x] MWF or [ ]  TTHS, [ ]  Burning with urination, [ ]  Difficulty urinating Skin: [ ]  Rashes, [ x] Wounds Psychological: [ ]  Anxiety, [ ]  Depression  Physical Examination Vitals:    09/06/21 1400 09/06/21 1404 09/06/21 1415 09/06/21 1428  BP: (!) 109/55 (!) 109/55  102/71  Pulse: 76 79 78 79  Resp: (!) 25 (!) 9 13 12   Temp:    98.2 F (36.8 C)  TempSrc:    Oral  SpO2: 99% 100% 98% 99%  Weight:    94.7 kg  Height:       Body mass index is 29.13 kg/m.  General:  WDWN in NAD HENT: WNL Eyes: Pupils equal Pulmonary: normal non-labored breathing , without Rales, rhonchi,  wheezing Cardiac: RRR, without  Murmurs, rubs or gallops; No carotid bruits Abdomen: soft, NT, no masses Skin: no rashes, ulcers noted;  no Gangrene , no cellulitis; left hip open wounds;   Vascular Exam/Pulses:palpable radial pulse, grip 5/5   Musculoskeletal: no muscle wasting or atrophy; no edema  Neurologic: A&O X 3; Appropriate Affect ;  SENSATION: normal; MOTOR FUNCTION: grossly intact B UE and LE with left BKA Speech is fluent/normal   Significant Diagnostic Studies: CBC Lab Results  Component Value Date   WBC 8.4 09/06/2021   HGB 7.6 (L) 09/06/2021   HCT 24.2 (L) 09/06/2021   MCV 96.8 09/06/2021   PLT 224 09/06/2021    BMET    Component Value Date/Time   NA 126 (L) 09/06/2021 0549   NA 140 10/29/2018 1439   K 5.6 (H) 09/06/2021 0549   CL 89 (L) 09/06/2021 0549   CO2 26 09/06/2021 0549   GLUCOSE 155 (H) 09/06/2021 0549   BUN 46 (H) 09/06/2021 0549   BUN 43 (H) 10/29/2018 1439   CREATININE 5.50 (H) 09/06/2021 0549   CREATININE 1.92 (H) 01/10/2016 1528   CALCIUM 8.6 (L) 09/06/2021 0549   CALCIUM 8.3 (L) 01/11/2020 0139   GFRNONAA 9 (L) 09/06/2021 0549   GFRNONAA 30 (L) 01/10/2016 1528   GFRAA 13 (L) 12/17/2018 0907   GFRAA 35 (L) 01/10/2016 1528   Estimated Creatinine Clearance: 14.8 mL/min (A) (by C-G formula based on SCr of 5.5 mg/dL (H)).  COAG Lab Results  Component Value Date   INR 1.1 12/28/2019   INR 1.2 12/17/2018   INR 0.99 04/07/2017     Non-Invasive Vascular Imaging:  Duplex of fistula has been ordered  ASSESSMENT/PLAN:  ESRD with  difficult stick for HD She was just at HD and once HD was started they didn't have problems with the run time.  We will order a fistula duplex and review the fistulogram.  There are not many options.  Possible large branch ligation verse starting over  with a new access.  Hopefully this one can be salvaged.     Roxy Horseman 09/06/2021 4:05 PM  VASCULAR STAFF ADDENDUM: I have independently interviewed and examined the patient. I agree with the above.  Imaging reviewed, adequate flow, size, depth for fistula cannulation. In speaking with the patient, this has not been an issue prior to last week, when she was accessed as an inpatient.  I offered branch ligation should difficulties continue. We will sign off for the time being  J. Melene Muller, MD [facility] Phone Number: [phone] 09/08/2021 12:06 PM

## 2021-09-06 NOTE — Progress Notes (Addendum)
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 CT scan revealed ununited left inter-trochanteric fracture, soft tissue wound overlying the greater trochanter with air extending from the skin surface to the joint space concerning for infection. Dr. [name] was consulted and the patient underwent an I&D on 6/28 and again on 7/1. Cultures have grown out VRE and the patient is receiving daptomycin per ID.  Subjective:  She states was able to stand yesterday. Left hip hurts.   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 [name] this can be changed to [facility] with 2 canisters when she is discharged to SNF -Most recent cultures growing VRE - ID recommends continuing daptomycin 6 weeks with dialysis with stop date being 10/12/2021 & follow-up with ID, Dr. Candiss Norse on 7/20  Active Problems: Left intertrochanteric  Fracture of left distal femur left - continue PT efforts-   Hyperkalemia - Potassium was 5.6 yesterday and despite dialysis, was 5.5 this morning - I have ordered Lokelma and we will follow the potassium  Hyponatremia sodium 126 yesterday and  today 128 - I will allow nephrology to manage this dialysis  Anemia of end-stage renal disease-suspected also to have acute blood loss from above wound - On 6/29 was given 2 units of packed red blood cells - Is oozing a mild amount of blood in the wound [facility] - Nephrology has been holding heparin with hemodialysis  -She is on heparin for DVT prophylaxis     Right knee pain -Suspected to be sprain - Continue conservative care  Hypokalemia - discussed with Dr Marval Regal- he will order a fistulagram as there was trouble accessing her site today for dialysis    ESRD (end stage renal disease) on dialysis since 11/22 -Continue hemodialysis Monday Wednesday Friday    Uncontrolled type 2 diabetes mellitus with hypoglycemia, with long-term current use of insulin (Gross) Hemoglobin A1C    Component Value Date/Time   HGBA1C 7.1 (H) 07/22/2021 1446   HGBA1C 7.6 (A) 05/10/2018 1120   - Continue Semglee, NovoLog with meals and NovoLog sliding scale    Benign essential HTN -Continue amlodipine, carvedilol, hydralazine  Right lower extremity edema History of right SFA revascularization - Elevate as possible- place TEDS     DVT prophylaxis:  SCDs Start: 08/31/21 1058 SCDs Start: 08/28/21 1049 heparin injection 5,000 Units Start: 08/26/21 1615     Code Status: Full Code  Consultants: ID, Ortho, nephrology Level of Care: Level of care: Telemetry Medical Disposition Plan:  Status is: Inpatient Remains inpatient appropriate because: currently still hyperkalemic, refusing to dialyze in a chair- adamantly refuses SNF- will have to go home with [facility]- per nephrology, Dr Marval Regal, she is stable from the renal perspective  to go home as long as she dialyzes in a chair.   Objective:   Vitals:   09/06/21 1400 09/06/21 1404 09/06/21 1415 09/06/21 1428  BP: (!) 109/55 (!) 109/55  102/71  Pulse: 76 79 78 79  Resp: (!) 25 (!) 9 13 12   Temp:    98.2 F (36.8 C)  TempSrc:    Oral  SpO2: 99% 100%  98% 99%  Weight:    94.7 kg  Height:       Filed Weights   09/02/21 0831 09/04/21 0848 09/06/21 1428  Weight: 92.6 kg 95.1 kg 94.7 kg   Exam: General exam: Appears comfortable  HEENT: PERRLA, oral mucosa moist, no sclera icterus or thrush Respiratory system: Clear to auscultation. Respiratory effort normal. Cardiovascular system: S1 & S2 heard, regular rate and rhythm Gastrointestinal system: Abdomen soft, non-tender, nondistended. Normal bowel sounds   Central nervous system: Alert and oriented. No focal neurological deficits. Extremities: No cyanosis, clubbing - edema of b/l legs. Left BKA, left hip wound vac with pink tinged drainage Skin: No rashes or ulcers Psychiatry:  Mood & affect appropriate.     Imaging and lab data was personally reviewed    CBC: Recent Labs  Lab 09/03/21 0153 09/04/21 0232 09/04/21 0913 09/05/21 0229 09/06/21 0932  WBC 10.5 12.4* 12.2* 9.9 8.4  HGB 7.4* 8.2* 8.0* 7.9* 7.6*  HCT 23.7* 26.3* 24.8* 25.2* 24.2*  MCV 97.5 97.4 95.8 98.1 96.8  PLT 257 317 321 238 081    Basic Metabolic Panel: Recent Labs  Lab 09/02/21 0745 09/03/21 0153 09/04/21 0232 09/05/21 0229 09/06/21 0549  NA 127* 131* 126* 128* 126*  K 4.9 4.9 5.6* 5.5* 5.6*  CL 88* 94* 89* 93* 89*  CO2 28 27 27 28 26   GLUCOSE 189* 189* 159* 158* 155*  BUN 52* 32* 49* 34* 46*  CREATININE 6.67* 4.44* 5.69* 4.57* 5.50*  CALCIUM 8.6* 8.4* 8.8* 8.4* 8.6*  PHOS  --   --   --   --  4.4    GFR: Estimated Creatinine Clearance: 14.8 mL/min (A) (by C-G formula based on SCr of 5.5 mg/dL (H)).  Scheduled Meds:  alosetron  1 mg Oral BID   amitriptyline  25 mg Oral QHS   amLODipine  5 mg Oral Daily   atorvastatin  40 mg Oral q1800   carvedilol  6.25 mg Oral BID   cyclobenzaprine  10 mg Oral TID   darbepoetin (ARANESP) injection - DIALYSIS  150 mcg Intravenous Q Fri-HD   docusate sodium  100 mg Oral BID   DULoxetine  60 mg Oral Daily   heparin  5,000 Units Subcutaneous Q8H    hydrALAZINE  50 mg Oral TID   insulin aspart  0-6 Units Subcutaneous TID WC   insulin aspart  5 Units Subcutaneous TID WC   insulin glargine-yfgn  12 Units Subcutaneous QHS   multivitamin  1 tablet Oral QHS   pantoprazole  80 mg Oral Daily   sevelamer carbonate  1,600 mg Oral TID WC   sodium chloride flush  3 mL Intravenous Q12H   sodium zirconium cyclosilicate  5 g Oral BID   Continuous Infusions:  sodium chloride     sodium chloride Stopped (08/29/21 0139)   sodium chloride     DAPTOmycin (CUBICIN) 900 mg in sodium chloride 0.9 % IVPB 900 mg (09/05/21 0104)   methocarbamol (ROBAXIN) IV       LOS: 11 days   Author: Debbe Odea  09/06/2021 3:05 PM

## 2021-09-07 ENCOUNTER — Inpatient Hospital Stay (HOSPITAL_COMMUNITY): Payer: Medicare Other

## 2021-09-07 DIAGNOSIS — E871 Hypo-osmolality and hyponatremia: Secondary | ICD-10-CM

## 2021-09-07 DIAGNOSIS — E875 Hyperkalemia: Secondary | ICD-10-CM | POA: Diagnosis not present

## 2021-09-07 DIAGNOSIS — N186 End stage renal disease: Secondary | ICD-10-CM

## 2021-09-07 DIAGNOSIS — M86252 Subacute osteomyelitis, left femur: Secondary | ICD-10-CM | POA: Diagnosis not present

## 2021-09-07 DIAGNOSIS — S72142S Displaced intertrochanteric fracture of left femur, sequela: Secondary | ICD-10-CM

## 2021-09-07 LAB — BASIC METABOLIC PANEL
Anion gap: 10 (ref 5–15)
BUN: 35 mg/dL — ABNORMAL HIGH (ref 6–20)
CO2: 28 mmol/L (ref 22–32)
Calcium: 8.1 mg/dL — ABNORMAL LOW (ref 8.9–10.3)
Chloride: 89 mmol/L — ABNORMAL LOW (ref 98–111)
Creatinine, Ser: 4.69 mg/dL — ABNORMAL HIGH (ref 0.44–1.00)
GFR, Estimated: 10 mL/min — ABNORMAL LOW (ref >60)
Glucose, Bld: 143 mg/dL — ABNORMAL HIGH (ref 70–99)
Potassium: 4.6 mmol/L (ref 3.5–5.1)
Sodium: 127 mmol/L — ABNORMAL LOW (ref 135–145)

## 2021-09-07 LAB — GLUCOSE, CAPILLARY
Glucose-Capillary: 178 mg/dL — ABNORMAL HIGH (ref 70–99)
Glucose-Capillary: 183 mg/dL — ABNORMAL HIGH (ref 70–99)
Glucose-Capillary: 120 mg/dL — ABNORMAL HIGH (ref 70–99)
Glucose-Capillary: 123 mg/dL — ABNORMAL HIGH (ref 70–99)

## 2021-09-07 NOTE — Progress Notes (Signed)
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 CT scan revealed ununited left inter-trochanteric fracture, soft tissue wound overlying the greater trochanter with air extending from the skin surface to the joint space concerning for infection. Dr. [name] was consulted and the patient underwent an I&D on 6/28 and again on 7/1. Cultures have grown out VRE and the patient is receiving daptomycin per ID.  Subjective:  Left hip hurts. Right knee hurts (actually points to thigh just above the knee) and states her thigh is swollen again. She states she is waiting for Dr [name] to close her wound. I have explained that he plans for her to dc from the hospital with the wound vac.   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 [name], wound vac can be changed to [place] with 2 canisters when she is discharged to SNF -Most recent cultures growing VRE - ID recommends continuing daptomycin 6 weeks with dialysis with stop date being 10/12/2021 & follow-up with ID, Dr. Candiss Norse on 7/20  Active Problems:  Swelling of right leg / hyperkalemia -  chronic but increasing with hyperkalemia- Nephro asked for fistulogram as there has been trouble accessing fistula - cont Lokelma - f/u Bmet  Left intertrochanteric  Fracture of left distal femur left - continue PT efforts-   Hyponatremia  -  to be managed with dialysis  Anemia of end-stage renal disease-suspected also to have acute blood loss from above wound - On 6/29 was given 2 units of packed red blood cells - Is oozing a mild amount of blood in the wound [facility] - Nephrology has been holding heparin with hemodialysis  -She is on heparin for DVT prophylaxis    Hypokalemia - discussed with Dr Marval Regal- he will order a fistulagram as there was trouble accessing her site today for dialysis    ESRD (end stage renal disease) on dialysis since 11/22 -Continue hemodialysis per nephrology (Monday Wednesday Friday)    Uncontrolled type 2 diabetes mellitus with hypoglycemia, with long-term current use of insulin ([place]) Hemoglobin A1C    Component Value Date/Time   HGBA1C 7.1 (H) 07/22/2021 1446   HGBA1C 7.6 (A) 05/10/2018 1120   - Continue Semglee, NovoLog with meals and NovoLog sliding scale    Benign essential HTN -Continue amlodipine, carvedilol, hydralazine  History of right SFA revascularization - Elevate as possible- place TEDS     DVT prophylaxis:   heparin injection 5,000 Units Start: 08/26/21 1615     Code Status: Full Code  Consultants: ID, Ortho, nephrology Level of Care: Level of care: Telemetry Medical Disposition Plan:  Status is: Inpatient Remains inpatient appropriate because:  difficult with standing, transfers - has  repeatedly declined to get into a dialysis chair- adamantly refuses SNF  -   Objective:   Vitals:   09/06/21 1428 09/06/21 1639 09/06/21 2101 09/07/21 0304  BP: 102/71 (!) 112/42 (!) 118/50 (!) 118/58  Pulse: 79 80 69 70  Resp: 12 19 18 16   Temp: 98.2 F (36.8 C) 99.2 F (37.3 C) 98 F (36.7 C) 98 F (36.7 C)  TempSrc: Oral Oral Oral  Oral  SpO2: 99% 96% 98% 96%  Weight: 94.7 kg     Height:       Filed Weights   09/02/21 0831 09/04/21 0848 09/06/21 1428  Weight: 92.6 kg 95.1 kg 94.7 kg   Exam: General exam: Appears comfortable  HEENT: PERRLA, oral mucosa moist, no sclera icterus or thrush Respiratory system: Clear to auscultation. Respiratory effort normal. Cardiovascular system: S1 & S2 heard, regular rate and rhythm Gastrointestinal system: Abdomen soft, non-tender, nondistended. Normal bowel sounds   Central nervous system: Alert and oriented. No focal neurological deficits. Extremities: No cyanosis, clubbing - right left slightly more swollen today- left BKA and wound vac Skin: No rashes or ulcers Psychiatry:  Mood & affect appropriate.       Imaging and lab data was personally reviewed    CBC: Recent Labs  Lab 09/03/21 0153 09/04/21 0232 09/04/21 0913 09/05/21 0229 09/06/21 0932  WBC 10.5 12.4* 12.2* 9.9 8.4  HGB 7.4* 8.2* 8.0* 7.9* 7.6*  HCT 23.7* 26.3* 24.8* 25.2* 24.2*  MCV 97.5 97.4 95.8 98.1 96.8  PLT 257 317 321 238 466    Basic Metabolic Panel: Recent Labs  Lab 09/02/21 0745 09/03/21 0153 09/04/21 0232 09/05/21 0229 09/06/21 0549  NA 127* 131* 126* 128* 126*  K 4.9 4.9 5.6* 5.5* 5.6*  CL 88* 94* 89* 93* 89*  CO2 28 27 27 28 26   GLUCOSE 189* 189* 159* 158* 155*  BUN 52* 32* 49* 34* 46*  CREATININE 6.67* 4.44* 5.69* 4.57* 5.50*  CALCIUM 8.6* 8.4* 8.8* 8.4* 8.6*  PHOS  --   --   --   --  4.4    GFR: Estimated Creatinine Clearance: 14.8 mL/min (A) (by C-G formula based on SCr of 5.5 mg/dL (H)).  Scheduled Meds:  alosetron  1 mg Oral BID   amitriptyline  25 mg Oral QHS   amLODipine  5 mg Oral Daily   atorvastatin  40 mg Oral q1800   carvedilol  6.25 mg Oral BID   cyclobenzaprine  10 mg Oral TID   darbepoetin (ARANESP) injection - DIALYSIS  150 mcg Intravenous Q Fri-HD   docusate sodium  100 mg Oral BID   DULoxetine  60 mg Oral Daily   heparin  5,000 Units Subcutaneous  Q8H   hydrALAZINE  50 mg Oral TID   insulin aspart  0-6 Units Subcutaneous TID WC   insulin aspart  5 Units Subcutaneous TID WC   insulin glargine-yfgn  12 Units Subcutaneous QHS   multivitamin  1 tablet Oral QHS   pantoprazole  80 mg Oral Daily   sevelamer carbonate  1,600 mg Oral TID WC   sodium chloride flush  3 mL Intravenous Q12H   sodium zirconium cyclosilicate  10 g Oral BID   Continuous Infusions:  sodium chloride     sodium chloride Stopped (08/29/21 0139)   sodium chloride     DAPTOmycin (CUBICIN) 900 mg in sodium chloride 0.9 % IVPB Stopped (09/06/21 2235)   methocarbamol (ROBAXIN) IV       LOS: 12 days  Author: Debbe Odea  09/07/2021 1:11 PM

## 2021-09-07 NOTE — Progress Notes (Signed)
AVF duplex completed. Refer to "CV Proc" under chart review to view preliminary results.  09/07/2021 3:12 PM Kelby Aline., MHA, RVT, RDCS, RDMS

## 2021-09-07 NOTE — Progress Notes (Signed)
[name] Progress Note   Subjective:  Completed dialysis yesterday with net UF 4.4L. Had some trouble with cannulation of AVF. Vascular consulted.    Objective Vitals:   09/06/21 1428 09/06/21 1639 09/06/21 2101 09/07/21 0304  BP: 102/71 (!) 112/42 (!) 118/50 (!) 118/58  Pulse: 79 80 69 70  Resp: 12 19 18 16   Temp: 98.2 F (36.8 C) 99.2 F (37.3 C) 98 F (36.7 C) 98 F (36.7 C)  TempSrc: Oral Oral Oral Oral  SpO2: 99% 96% 98% 96%  Weight: 94.7 kg     Height:       Physical Exam General: Well appearing woman, NAD. Room air. Heart: RRR; no murmur Lungs: CTA anteriorly Abdomen:soft, non-tender Extremities: RLE with 1+ edema; L BKA with 1+ stump edema. Wound vac to L hip area Dialysis Access: L AVF + bruit  Additional Objective Labs: Basic Metabolic Panel: Recent Labs  Lab 09/04/21 0232 09/05/21 0229 09/06/21 0549  NA 126* 128* 126*  K 5.6* 5.5* 5.6*  CL 89* 93* 89*  CO2 27 28 26   GLUCOSE 159* 158* 155*  BUN 49* 34* 46*  CREATININE 5.69* 4.57* 5.50*  CALCIUM 8.8* 8.4* 8.6*  PHOS  --   --  4.4    Liver Function Tests: Recent Labs  Lab 09/02/21 0745 09/03/21 0153 09/04/21 0232 09/06/21 0549  AST 13* 15 13*  --   ALT <5 <5 <5  --   ALKPHOS 163* 144* 140*  --   BILITOT 0.5 0.5 0.6  --   PROT 6.1* 5.6* 6.4*  --   ALBUMIN 2.3* 2.1* 2.4* 2.2*    CBC: Recent Labs  Lab 09/03/21 0153 09/04/21 0232 09/04/21 0913 09/05/21 0229 09/06/21 0932  WBC 10.5 12.4* 12.2* 9.9 8.4  HGB 7.4* 8.2* 8.0* 7.9* 7.6*  HCT 23.7* 26.3* 24.8* 25.2* 24.2*  MCV 97.5 97.4 95.8 98.1 96.8  PLT 257 317 321 238 224    Blood Culture    Component Value Date/Time   SDES TISSUE 08/28/2021 0917   SPECREQUEST LEFT THIGH TISSUE 08/28/2021 0917   CULT  08/28/2021 0917    MODERATE ENTEROCOCCUS FAECIUM VANCOMYCIN RESISTANT ENTEROCOCCUS ISOLATED NO ANAEROBES ISOLATED Sent to [place] for further susceptibility testing. Performed at [facility], [place]  [address] [place], [place] [address]    REPTSTATUS 09/02/2021 FINAL 08/28/2021 1448   Medications:  sodium chloride     sodium chloride Stopped (08/29/21 0139)   sodium chloride     DAPTOmycin (CUBICIN) 900 mg in sodium chloride 0.9 % IVPB Stopped (09/06/21 2235)   methocarbamol (ROBAXIN) IV      alosetron  1 mg Oral BID   amitriptyline  25 mg Oral QHS   amLODipine  5 mg Oral Daily   atorvastatin  40 mg Oral q1800   carvedilol  6.25 mg Oral BID   cyclobenzaprine  10 mg Oral TID   darbepoetin (ARANESP) injection - DIALYSIS  150 mcg Intravenous Q Fri-HD   docusate sodium  100 mg Oral BID   DULoxetine  60 mg Oral Daily   heparin  5,000 Units Subcutaneous Q8H   hydrALAZINE  50 mg Oral TID   insulin aspart  0-6 Units Subcutaneous TID WC   insulin aspart  5 Units Subcutaneous TID WC   insulin glargine-yfgn  12 Units Subcutaneous QHS   multivitamin  1 tablet Oral QHS   pantoprazole  80 mg Oral Daily   sevelamer carbonate  1,600 mg Oral TID WC   sodium chloride  flush  3 mL Intravenous Q12H   sodium zirconium cyclosilicate  10 g Oral BID    Dialysis Orders: [facility] MWF 4h  400/500  90kg  2/2 bath  P2  Heparin 4000 units IV TIW  LUA AVF   Assessment/Plan: L hip infection: S/p recent L hip surgery on 08/10/21 -> persistent infection. Went to OR 08/28/2021 and 08/31/21 for L hip debridement, wound vac in place. Wound Cx + VRE, on Daptomycin. ESRD - on HD MWF. Serial HD 06/28, 6/29 and 6/30. Volume status improved, now trending back up. Next HD 7/10. Attempt in recliner.  HTN/ volume: BP high on admission, now improved. Weight up again. Occasional soft BP here, follow trend and will adjust meds as needed.    Anemia of ESRD - Hgb declined post op - 2U PRBCs given 6/29. Hgb 7.6.  Holding heparin with HD. Continue Aranesp q Friday. 150 mcg ordered for 7/7. Secondary HPTH: CCa in range, PO4 at goal.  Cont binders.  DM2: Insulin per primary Dispo: Will need to dialyze in recliner prior to discharge.    Lynnda Child PA-C [facility] 09/07/2021,10:21 AM

## 2021-09-08 DIAGNOSIS — E871 Hypo-osmolality and hyponatremia: Secondary | ICD-10-CM | POA: Diagnosis not present

## 2021-09-08 DIAGNOSIS — E875 Hyperkalemia: Secondary | ICD-10-CM | POA: Diagnosis not present

## 2021-09-08 DIAGNOSIS — M86252 Subacute osteomyelitis, left femur: Secondary | ICD-10-CM | POA: Diagnosis not present

## 2021-09-08 DIAGNOSIS — S72142S Displaced intertrochanteric fracture of left femur, sequela: Secondary | ICD-10-CM | POA: Diagnosis not present

## 2021-09-08 LAB — BASIC METABOLIC PANEL
Anion gap: 11 (ref 5–15)
BUN: 42 mg/dL — ABNORMAL HIGH (ref 6–20)
CO2: 26 mmol/L (ref 22–32)
Calcium: 8.1 mg/dL — ABNORMAL LOW (ref 8.9–10.3)
Chloride: 87 mmol/L — ABNORMAL LOW (ref 98–111)
Creatinine, Ser: 5.2 mg/dL — ABNORMAL HIGH (ref 0.44–1.00)
GFR, Estimated: 9 mL/min — ABNORMAL LOW (ref >60)
Glucose, Bld: 73 mg/dL (ref 70–99)
Potassium: 4.7 mmol/L (ref 3.5–5.1)
Sodium: 124 mmol/L — ABNORMAL LOW (ref 135–145)

## 2021-09-08 LAB — GLUCOSE, CAPILLARY
Glucose-Capillary: 90 mg/dL (ref 70–99)
Glucose-Capillary: 151 mg/dL — ABNORMAL HIGH (ref 70–99)
Glucose-Capillary: 98 mg/dL (ref 70–99)
Glucose-Capillary: 183 mg/dL — ABNORMAL HIGH (ref 70–99)

## 2021-09-08 MED ORDER — CHLORHEXIDINE GLUCONATE CLOTH 2 % EX PADS
6.0000 | MEDICATED_PAD | Freq: Every day | CUTANEOUS | Status: DC
  Administered 2021-09-08 – 2021-09-17 (×9): 6 via TOPICAL

## 2021-09-08 NOTE — Plan of Care (Signed)
  Problem: Education: Goal: Ability to describe self-care measures that may prevent or decrease complications (Diabetes Survival Skills Education) will improve Outcome: Progressing   

## 2021-09-08 NOTE — Progress Notes (Signed)
Pharmacy Antibiotic Note  [name] is a 56 y.o. 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 Prior intra-op cultures in May grew MSSA, now with intra-op cultures this admission growing Enterococcus faecium - vancomycin, ampicillin resistant. Pharmacy has been consulted for Daptomycin dosing.  Noted pt is ESRD and being dosed after dialysis MWF  CK 23 on 7/6  Plan: - Daptomycin 900 mg (10 mg/kg TBW) IV every MWF after HD, anticipated stop date 10/12/21 per ID - Will continue to follow HD schedule/duration, LOT, and antibiotic de-escalation plans   Height: 5' 10.98" (180.3 cm) Weight: 94.7 kg (208 lb 12.4 oz) IBW/kg (Calculated) : 70.76  Temp (24hrs), Avg:97.8 F (36.6 C), Min:97.8 F (36.6 C), Max:97.8 F (36.6 C)  Recent Labs  Lab 09/03/21 0153 09/04/21 0232 09/04/21 0913 09/05/21 0229 09/06/21 0549 09/06/21 0932 09/07/21 1257 09/08/21 0257  WBC 10.5 12.4* 12.2* 9.9  --  8.4  --   --   CREATININE 4.44* 5.69*  --  4.57* 5.50*  --  4.69* 5.20*     Estimated Creatinine Clearance: 15.7 mL/min (A) (by C-G formula based on SCr of 5.2 mg/dL (H)).    Allergies  Allergen Reactions   Trazodone Swelling   Latex Rash   Lidocaine Itching    Francena Hanly, Pharm.D. [place] PGY1 Pharmacy Resident   09/08/2021 7:34 AM

## 2021-09-08 NOTE — Progress Notes (Addendum)
[facility] Progress Note   Subjective:  Seen in room. No new complaints. Sat up on side of the bed yesterday. Not ready for dialysis in recliner yet. Will try on Wed.   Objective Vitals:   09/07/21 0304 09/07/21 1603 09/07/21 2032 09/08/21 0353  BP: (!) 118/58 (!) 128/51 (!) 124/52 125/63  Pulse: 70 66 69 66  Resp: 16 17 16 16   Temp: 98 F (36.7 C)  97.8 F (36.6 C) 97.8 F (36.6 C)  TempSrc: Oral  Oral Oral  SpO2: 96% 99% 100% 97%  Weight:      Height:       Physical Exam General: Well appearing woman, NAD. Room air. Heart: RRR; no murmur Lungs: CTA anteriorly Abdomen:soft, non-tender Extremities: RLE with 1+ edema; L BKA with 1+ stump edema. Wound vac to L hip area Dialysis Access: L AVF + bruit  Additional Objective Labs: Basic Metabolic Panel: Recent Labs  Lab 09/06/21 0549 09/07/21 1257 09/08/21 0257  NA 126* 127* 124*  K 5.6* 4.6 4.7  CL 89* 89* 87*  CO2 26 28 26   GLUCOSE 155* 143* 73  BUN 46* 35* 42*  CREATININE 5.50* 4.69* 5.20*  CALCIUM 8.6* 8.1* 8.1*  PHOS 4.4  --   --     Liver Function Tests: Recent Labs  Lab 09/02/21 0745 09/03/21 0153 09/04/21 0232 09/06/21 0549  AST 13* 15 13*  --   ALT <5 <5 <5  --   ALKPHOS 163* 144* 140*  --   BILITOT 0.5 0.5 0.6  --   PROT 6.1* 5.6* 6.4*  --   ALBUMIN 2.3* 2.1* 2.4* 2.2*    CBC: Recent Labs  Lab 09/03/21 0153 09/04/21 0232 09/04/21 0913 09/05/21 0229 09/06/21 0932  WBC 10.5 12.4* 12.2* 9.9 8.4  HGB 7.4* 8.2* 8.0* 7.9* 7.6*  HCT 23.7* 26.3* 24.8* 25.2* 24.2*  MCV 97.5 97.4 95.8 98.1 96.8  PLT 257 317 321 238 224    Blood Culture    Component Value Date/Time   SDES TISSUE 08/28/2021 0917   SPECREQUEST LEFT THIGH TISSUE 08/28/2021 0917   CULT  08/28/2021 0917    MODERATE ENTEROCOCCUS FAECIUM VANCOMYCIN RESISTANT ENTEROCOCCUS ISOLATED NO ANAEROBES ISOLATED Sent to [place] for further susceptibility testing. Performed at [facility], [place] [address],  [place], [place] [address]    REPTSTATUS 09/02/2021 FINAL 08/28/2021 3254   Medications:  sodium chloride     sodium chloride Stopped (08/29/21 0139)   sodium chloride     DAPTOmycin (CUBICIN) 900 mg in sodium chloride 0.9 % IVPB Stopped (09/06/21 2235)   methocarbamol (ROBAXIN) IV      alosetron  1 mg Oral BID   amitriptyline  25 mg Oral QHS   amLODipine  5 mg Oral Daily   atorvastatin  40 mg Oral q1800   carvedilol  6.25 mg Oral BID   cyclobenzaprine  10 mg Oral TID   darbepoetin (ARANESP) injection - DIALYSIS  150 mcg Intravenous Q Fri-HD   docusate sodium  100 mg Oral BID   DULoxetine  60 mg Oral Daily   heparin  5,000 Units Subcutaneous Q8H   hydrALAZINE  50 mg Oral TID   insulin aspart  0-6 Units Subcutaneous TID WC   insulin aspart  5 Units Subcutaneous TID WC   insulin glargine-yfgn  12 Units Subcutaneous QHS   multivitamin  1 tablet Oral QHS   pantoprazole  80 mg Oral Daily   sevelamer carbonate  1,600 mg Oral TID WC  sodium chloride flush  3 mL Intravenous Q12H   sodium zirconium cyclosilicate  10 g Oral BID    Dialysis Orders: [facility] MWF 4h  400/500  90kg  2/2 bath  P2  Heparin 4000 units IV TIW  LUA AVF   Assessment/Plan: L hip infection: S/p recent L hip surgery on 08/10/21 -> persistent infection. Went to OR 08/28/2021 and 08/31/21 for L hip debridement, wound vac in place. Wound Cx + VRE, on Daptomycin. ESRD - on HD MWF. Serial HD 06/28, 6/29 and 6/30. Volume status improved, now trending back up. Low Na suggesting volume excess. K 4.7. Follow trends.  Next HD 7/10.  Access - Difficult cannulation on Friday. VVS consulted -duplex study  HTN/ volume: BP high on admission, now improved. Weight up again. Attempt 4L UF on Monday. May need extra HD next week.   Anemia of ESRD - Hgb declined post op - 2U PRBCs given 6/29. Hgb 7.6.  Holding heparin with HD. Continue Aranesp q Friday. 150 mcg ordered for 7/7. Secondary HPTH: CCa in range, PO4 at goal.  Cont binders.  DM2:  Insulin per primary Dispo: Will need to dialyze in recliner prior to discharge. She does not feel ready yet.  Lynnda Child PA-C [facility] 09/08/2021,9:44 AM

## 2021-09-08 NOTE — Progress Notes (Signed)
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 CT scan revealed ununited left inter-trochanteric fracture, soft tissue wound overlying the greater trochanter with air extending from the skin surface to the joint space concerning for infection. Dr. [name] was consulted and the patient underwent an I&D on 6/28 and again on 7/1. Cultures have grown out VRE and the patient is receiving daptomycin per ID.  Subjective:  Right thigh no longer hurting. Left hip pain unchanged. No other complaints.   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 [name], wound vac can be changed to [place] with 2 canisters when she is discharged to SNF -Most recent cultures growing VRE - ID recommends continuing daptomycin 6 weeks with dialysis with stop date being 10/12/2021 & follow-up with ID, Dr. Candiss Norse on 7/20  Active Problems:  Swelling of right leg  - chronic- needs elevation and TEDS to keep swelling down  Hyperkalemia - Nephro asked for fistulogram as there has been trouble accessing fistula - cont Lokelma - f/u Bmet  Left intertrochanteric   Fracture of left distal femur left - continue PT efforts-   Hyponatremia  -  to be managed with dialysis  Anemia of end-stage renal disease-suspected also to have acute blood loss from above wound - On 6/29 was given 2 units of packed red blood cells - Is oozing a mild amount of blood in the wound [facility] - Nephrology has been holding heparin with hemodialysis  -She is on heparin for DVT prophylaxis     ESRD (end stage renal disease) on dialysis since 11/22 -Continue hemodialysis per nephrology (Monday Wednesday Friday)    Uncontrolled type 2 diabetes mellitus with hypoglycemia, with long-term current use of insulin ([place]) Hemoglobin A1C    Component Value Date/Time   HGBA1C 7.1 (H) 07/22/2021 1446   HGBA1C 7.6 (A) 05/10/2018 1120   - Continue Semglee, NovoLog with meals and NovoLog sliding scale    Benign essential HTN -Continue amlodipine, carvedilol, hydralazine  History of right SFA revascularization - Elevate as possible- place TEDS     DVT prophylaxis:   heparin injection 5,000 Units Start: 08/26/21 1615     Code Status: Full Code  Consultants: ID, Ortho, nephrology Level of Care: Level of care: Telemetry Medical Disposition Plan:  Status is: Inpatient Remains inpatient appropriate because:  difficult with standing, transfers - has repeatedly declined to get into a dialysis chair- adamantly refuses SNF  -   Objective:   Vitals:   09/07/21 0304 09/07/21 1603 09/07/21 2032 09/08/21 0353  BP: (!) 118/58 (!) 128/51 (!) 124/52 125/63  Pulse: 70 66 69 66  Resp: 16 17 16 16   Temp: 98 F (36.7 C)  97.8 F (36.6 C) 97.8 F (36.6 C)  TempSrc: Oral  Oral Oral  SpO2: 96% 99% 100% 97%  Weight:      Height:       Filed Weights   09/02/21 0831 09/04/21 0848 09/06/21 1428  Weight: 92.6 kg 95.1 kg 94.7 kg   Exam: General exam: Appears comfortable  HEENT: PERRLA, oral mucosa moist, no sclera icterus or thrush Respiratory system: Clear to auscultation. Respiratory  effort normal. Cardiovascular system: S1 & S2 heard, regular rate and rhythm Gastrointestinal system: Abdomen soft, non-tender, nondistended. Normal bowel sounds   Central nervous system: Alert and oriented. No focal neurological deficits. Extremities: No cyanosis, clubbing or edema- right leg edema has resolved- wound vac on left hip/thigh drinking pink tinged fluid Skin: No rashes or ulcers Psychiatry:  Mood & affect appropriate.        Imaging and lab data was personally reviewed    CBC: Recent Labs  Lab 09/03/21 0153 09/04/21 0232 09/04/21 0913 09/05/21 0229 09/06/21 0932  WBC 10.5 12.4* 12.2* 9.9 8.4  HGB 7.4* 8.2* 8.0* 7.9* 7.6*  HCT 23.7* 26.3* 24.8* 25.2* 24.2*  MCV 97.5 97.4 95.8 98.1 96.8  PLT 257 317 321 238 374    Basic Metabolic Panel: Recent Labs  Lab 09/04/21 0232 09/05/21 0229 09/06/21 0549 09/07/21 1257 09/08/21 0257  NA 126* 128* 126* 127* 124*  K 5.6* 5.5* 5.6* 4.6 4.7  CL 89* 93* 89* 89* 87*  CO2 27 28 26 28 26   GLUCOSE 159* 158* 155* 143* 73  BUN 49* 34* 46* 35* 42*  CREATININE 5.69* 4.57* 5.50* 4.69* 5.20*  CALCIUM 8.8* 8.4* 8.6* 8.1* 8.1*  PHOS  --   --  4.4  --   --     GFR: Estimated Creatinine Clearance: 15.7 mL/min (A) (by C-G formula based on SCr of 5.2 mg/dL (H)).  Scheduled Meds:  alosetron  1 mg Oral BID   amitriptyline  25 mg Oral QHS   amLODipine  5 mg Oral Daily   atorvastatin  40 mg Oral q1800   carvedilol  6.25 mg Oral BID   Chlorhexidine Gluconate Cloth  6 each Topical Q0600   cyclobenzaprine  10 mg Oral TID   darbepoetin (ARANESP) injection - DIALYSIS  150 mcg Intravenous Q Fri-HD   docusate sodium  100 mg Oral BID   DULoxetine  60 mg Oral Daily   heparin  5,000 Units Subcutaneous Q8H   hydrALAZINE  50 mg Oral TID   insulin aspart  0-6 Units Subcutaneous TID WC   insulin aspart  5 Units Subcutaneous TID WC   insulin glargine-yfgn  12 Units Subcutaneous QHS   multivitamin  1 tablet Oral QHS   pantoprazole  80 mg  Oral Daily   sevelamer carbonate  1,600 mg Oral TID WC   sodium chloride flush  3 mL Intravenous Q12H   Continuous Infusions:  sodium chloride     sodium chloride Stopped (08/29/21 0139)   sodium chloride     DAPTOmycin (CUBICIN) 900 mg in sodium chloride 0.9 % IVPB Stopped (09/06/21 2235)   methocarbamol (ROBAXIN) IV       LOS: 13 days   Author: Debbe Odea  09/08/2021 12:35 PM

## 2021-09-09 DIAGNOSIS — E875 Hyperkalemia: Secondary | ICD-10-CM | POA: Diagnosis not present

## 2021-09-09 DIAGNOSIS — E871 Hypo-osmolality and hyponatremia: Secondary | ICD-10-CM | POA: Diagnosis not present

## 2021-09-09 DIAGNOSIS — S72142S Displaced intertrochanteric fracture of left femur, sequela: Secondary | ICD-10-CM | POA: Diagnosis not present

## 2021-09-09 DIAGNOSIS — M86252 Subacute osteomyelitis, left femur: Secondary | ICD-10-CM | POA: Diagnosis not present

## 2021-09-09 LAB — BASIC METABOLIC PANEL
Anion gap: 10 (ref 5–15)
BUN: 50 mg/dL — ABNORMAL HIGH (ref 6–20)
CO2: 27 mmol/L (ref 22–32)
Calcium: 8.3 mg/dL — ABNORMAL LOW (ref 8.9–10.3)
Chloride: 86 mmol/L — ABNORMAL LOW (ref 98–111)
Creatinine, Ser: 6.04 mg/dL — ABNORMAL HIGH (ref 0.44–1.00)
GFR, Estimated: 8 mL/min — ABNORMAL LOW (ref >60)
Glucose, Bld: 152 mg/dL — ABNORMAL HIGH (ref 70–99)
Potassium: 4.9 mmol/L (ref 3.5–5.1)
Sodium: 123 mmol/L — ABNORMAL LOW (ref 135–145)

## 2021-09-09 LAB — GLUCOSE, CAPILLARY
Glucose-Capillary: 130 mg/dL — ABNORMAL HIGH (ref 70–99)
Glucose-Capillary: 145 mg/dL — ABNORMAL HIGH (ref 70–99)
Glucose-Capillary: 170 mg/dL — ABNORMAL HIGH (ref 70–99)

## 2021-09-09 NOTE — Progress Notes (Signed)
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 CT scan revealed ununited left inter-trochanteric fracture, soft tissue wound overlying the greater trochanter with air extending from the skin surface to the joint space concerning for infection. Dr. [name] was consulted and the patient underwent an I&D on 6/28 and again on 7/1. Cultures have grown out VRE and the patient is receiving daptomycin per ID.  Subjective:  Soreness in left hip. She is pleased that her right leg is no longer swollen and does not hurt.  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 [name], wound vac can be changed to [place] with 2 canisters when she is discharged to SNF -Most recent cultures growing VRE - ID recommends continuing daptomycin 6 weeks with dialysis with stop date being 10/12/2021 & follow-up with ID, Dr. Candiss Norse on 7/20  Active Problems:  Left intertrochanteric fracture Fracture of left distal femur  - continue PT efforts- still has not gotten into a chair  Swelling of right leg, chronic -  needs elevation and TEDS which help a great  deal  Hyperkalemia - I dicussed this with nephro on Friday- Nephro asked for fistulogram as there has been trouble accessing fistula- prelim result> Arteriovenous fistula-Velocities in outflow vein less than 100cm/s noted  Hyponatremia  -  to be managed with dialysis  Anemia of end-stage renal disease-suspected also to have acute blood loss from above wound - On 6/29 was given 2 units of packed red blood cells - Is oozing a mild amount of blood in the wound VAC     ESRD (end stage renal disease) on dialysis since 11/22 -Continue hemodialysis per nephrology (Monday Wednesday Friday)    Uncontrolled type 2 diabetes mellitus with hypoglycemia, with long-term current use of insulin ([place]) Hemoglobin A1C    Component Value Date/Time   HGBA1C 7.1 (H) 07/22/2021 1446   HGBA1C 7.6 (A) 05/10/2018 1120   - Continue Semglee, NovoLog with meals and NovoLog sliding scale    Benign essential HTN -Continue amlodipine, carvedilol, hydralazine  History of right SFA revascularization    DVT prophylaxis:   heparin injection 5,000 Units Start: 08/26/21 1615     Code Status: Full Code  Consultants: ID, Ortho, nephrology Level of Care: Level of care: Telemetry Medical Disposition Plan:  Status is: Inpatient Remains inpatient appropriate because:  difficult with standing, transfers - has repeatedly declined to get into a dialysis chair- adamantly refuses SNF  -   Objective:   Vitals:   09/08/21 1839 09/08/21 2140 09/09/21 0525 09/09/21 1018  BP: (!) 107/53 137/62 (!) 140/53 118/76  Pulse: 71 66 68 69  Resp: 18 18 16 18   Temp: 98.4 F (36.9 C)  97.9 F (36.6 C) 98 F (36.7 C)   TempSrc: Oral     SpO2: 97% 99% 100% 100%  Weight:      Height:       Filed Weights   09/02/21 0831 09/04/21 0848 09/06/21 1428  Weight: 92.6 kg 95.1 kg 94.7 kg   Exam: General exam: Appears comfortable  HEENT: PERRLA, oral mucosa moist, no sclera icterus or thrush Respiratory system: Clear to  auscultation. Respiratory effort normal. Cardiovascular system: S1 & S2 heard, regular rate and rhythm Gastrointestinal system: Abdomen soft, non-tender, nondistended. Normal bowel sounds   Central nervous system: Alert and oriented. No focal neurological deficits. Extremities: No cyanosis, clubbing or edema- wound vac on left leg with pink tinged discharge Skin: No rashes or ulcers Psychiatry:  Mood & affect appropriate.     Imaging and lab data was personally reviewed    CBC: Recent Labs  Lab 09/03/21 0153 09/04/21 0232 09/04/21 0913 09/05/21 0229 09/06/21 0932  WBC 10.5 12.4* 12.2* 9.9 8.4  HGB 7.4* 8.2* 8.0* 7.9* 7.6*  HCT 23.7* 26.3* 24.8* 25.2* 24.2*  MCV 97.5 97.4 95.8 98.1 96.8  PLT 257 317 321 238 275    Basic Metabolic Panel: Recent Labs  Lab 09/05/21 0229 09/06/21 0549 09/07/21 1257 09/08/21 0257 09/09/21 0036  NA 128* 126* 127* 124* 123*  K 5.5* 5.6* 4.6 4.7 4.9  CL 93* 89* 89* 87* 86*  CO2 28 26 28 26 27   GLUCOSE 158* 155* 143* 73 152*  BUN 34* 46* 35* 42* 50*  CREATININE 4.57* 5.50* 4.69* 5.20* 6.04*  CALCIUM 8.4* 8.6* 8.1* 8.1* 8.3*  PHOS  --  4.4  --   --   --     GFR: Estimated Creatinine Clearance: 13.5 mL/min (A) (by C-G formula based on SCr of 6.04 mg/dL (H)).  Scheduled Meds:  alosetron  1 mg Oral BID   amitriptyline  25 mg Oral QHS   amLODipine  5 mg Oral Daily   atorvastatin  40 mg Oral q1800   carvedilol  6.25 mg Oral BID   Chlorhexidine Gluconate Cloth  6 each Topical Q0600   cyclobenzaprine  10 mg Oral TID   darbepoetin (ARANESP) injection - DIALYSIS  150 mcg Intravenous Q Fri-HD   docusate sodium  100 mg Oral BID   DULoxetine  60 mg Oral Daily   heparin  5,000 Units Subcutaneous Q8H   hydrALAZINE  50 mg Oral TID   insulin aspart  0-6 Units Subcutaneous TID WC   insulin aspart  5 Units Subcutaneous TID WC   insulin glargine-yfgn  12 Units Subcutaneous QHS   multivitamin  1 tablet Oral QHS   pantoprazole  80 mg Oral Daily    sevelamer carbonate  1,600 mg Oral TID WC   sodium chloride flush  3 mL Intravenous Q12H   Continuous Infusions:  sodium chloride     sodium chloride Stopped (08/29/21 0139)   sodium chloride     DAPTOmycin (CUBICIN) 900 mg in sodium chloride 0.9 % IVPB Stopped (09/06/21 2235)   methocarbamol (ROBAXIN) IV       LOS: 14 days   Author: Debbe Odea  09/09/2021 12:17 PM

## 2021-09-09 NOTE — Progress Notes (Signed)
[name] Progress Note   Subjective:    Seen and examined patient at bedside. Denies SOB, CP, and N/V. Plan for HD today.  Objective Vitals:   09/08/21 1839 09/08/21 2140 09/09/21 0525 09/09/21 1018  BP: (!) 107/53 137/62 (!) 140/53 118/76  Pulse: 71 66 68 69  Resp: 18 18 16 18   Temp: 98.4 F (36.9 C) 97.9 F (36.6 C) 98 F (36.7 C)   TempSrc: Oral     SpO2: 97% 99% 100% 100%  Weight:      Height:       Physical Exam General: Well-appearing; NAD Heart: S1 and S2; No murmurs, gallops, or rubs Lungs: Clear throughout; No wheezing, rales, or rhonchi Abdomen: Soft and non-tender Extremities: 1+ edema R thigh and RLE-compression stocking in place; L BKA with 1+ trace edema; wound vac to L hip area Dialysis Access: L AVF (+) B/T   Filed Weights   09/02/21 0831 09/04/21 0848 09/06/21 1428  Weight: 92.6 kg 95.1 kg 94.7 kg    Intake/Output Summary (Last 24 hours) at 09/09/2021 1115 Last data filed at 09/09/2021 0900 Gross per 24 hour  Intake 1300 ml  Output 800 ml  Net 500 ml    Additional Objective Labs: Basic Metabolic Panel: Recent Labs  Lab 09/06/21 0549 09/07/21 1257 09/08/21 0257 09/09/21 0036  NA 126* 127* 124* 123*  K 5.6* 4.6 4.7 4.9  CL 89* 89* 87* 86*  CO2 26 28 26 27   GLUCOSE 155* 143* 73 152*  BUN 46* 35* 42* 50*  CREATININE 5.50* 4.69* 5.20* 6.04*  CALCIUM 8.6* 8.1* 8.1* 8.3*  PHOS 4.4  --   --   --    Liver Function Tests: Recent Labs  Lab 09/03/21 0153 09/04/21 0232 09/06/21 0549  AST 15 13*  --   ALT <5 <5  --   ALKPHOS 144* 140*  --   BILITOT 0.5 0.6  --   PROT 5.6* 6.4*  --   ALBUMIN 2.1* 2.4* 2.2*   No results for input(s): "LIPASE", "AMYLASE" in the last 168 hours. CBC: Recent Labs  Lab 09/03/21 0153 09/04/21 0232 09/04/21 0913 09/05/21 0229 09/06/21 0932  WBC 10.5 12.4* 12.2* 9.9 8.4  HGB 7.4* 8.2* 8.0* 7.9* 7.6*  HCT 23.7* 26.3* 24.8* 25.2* 24.2*  MCV 97.5 97.4 95.8 98.1 96.8  PLT 257 317 321 238  224   Blood Culture    Component Value Date/Time   SDES TISSUE 08/28/2021 0917   SPECREQUEST LEFT THIGH TISSUE 08/28/2021 0917   CULT  08/28/2021 0917    MODERATE ENTEROCOCCUS FAECIUM VANCOMYCIN RESISTANT ENTEROCOCCUS ISOLATED NO ANAEROBES ISOLATED Sent to [facility] for further susceptibility testing. Performed at [facility], [place] [address], [place], [place] [address]    REPTSTATUS 09/02/2021 FINAL 08/28/2021 0917    Cardiac Enzymes: Recent Labs  Lab 09/06/21 0549  CKTOTAL 23*   CBG: Recent Labs  Lab 09/08/21 0742 09/08/21 1147 09/08/21 1623 09/08/21 2141 09/09/21 0721  GLUCAP 90 151* 98 183* 130*   Iron Studies: No results for input(s): "IRON", "TIBC", "TRANSFERRIN", "FERRITIN" in the last 72 hours. Lab Results  Component Value Date   INR 1.1 12/28/2019   INR 1.2 12/17/2018   INR 0.99 04/07/2017   Studies/Results: VAS US DUPLEX DIALYSIS ACCESS (AVF, AVG)  Result Date: 09/07/2021 DIALYSIS ACCESS Patient Name:  [name]  Date of Exam:   09/07/2021 Medical Rec #: [id number]         Accession #:  [id number] Date of Birth: 05-Sep-1965         Patient Gender: F Patient Age:   56 years Exam Location:  [facility] Procedure:      VAS US DUPLEX DIALYSIS ACCESS (AVF, AVG) Referring Phys: EMMA COLLINS --------------------------------------------------------------------------------  Reason for Exam: Unable to dialyze through AVF/AVG. Access Site: Left Upper Extremity. Access Type: Brachial-cephalic AVF. Comparison Study: 08/02/2020- AVF duplex Performing Technologist: Maudry Mayhew MHA, RDMS, RVT, RDCS  Examination Guidelines: A complete evaluation includes B-mode imaging, spectral Doppler, color Doppler, and power Doppler as needed of all accessible portions of each vessel. Unilateral testing is considered an integral part of a complete examination. Limited examinations for reoccurring indications may be performed as noted.  Findings:  +--------------------+----------+-----------------+--------+ AVF                 PSV (cm/s)Flow Vol (mL/min)Comments +--------------------+----------+-----------------+--------+ Native artery inflow   261           607                +--------------------+----------+-----------------+--------+ AVF Anastomosis        236                              +--------------------+----------+-----------------+--------+  +------------+----------+-------------+----------+--------+ OUTFLOW VEINPSV (cm/s)Diameter (cm)Depth (cm)Describe +------------+----------+-------------+----------+--------+ Mid UA          50        0.97        0.67            +------------+----------+-------------+----------+--------+ Dist UA         68        0.99        0.62            +------------+----------+-------------+----------+--------+ AC Fossa       635        0.47        0.24            +------------+----------+-------------+----------+--------+   Summary: Arteriovenous fistula-Velocities in outflow vein less than 100cm/s noted. *See table(s) above for measurements and observations.      --------------------------------------------------------------------------------   Preliminary     Medications:  sodium chloride     sodium chloride Stopped (08/29/21 0139)   sodium chloride     DAPTOmycin (CUBICIN) 900 mg in sodium chloride 0.9 % IVPB Stopped (09/06/21 2235)   methocarbamol (ROBAXIN) IV      alosetron  1 mg Oral BID   amitriptyline  25 mg Oral QHS   amLODipine  5 mg Oral Daily   atorvastatin  40 mg Oral q1800   carvedilol  6.25 mg Oral BID   Chlorhexidine Gluconate Cloth  6 each Topical Q0600   cyclobenzaprine  10 mg Oral TID   darbepoetin (ARANESP) injection - DIALYSIS  150 mcg Intravenous Q Fri-HD   docusate sodium  100 mg Oral BID   DULoxetine  60 mg Oral Daily   heparin  5,000 Units Subcutaneous Q8H   hydrALAZINE  50 mg Oral TID   insulin aspart  0-6 Units Subcutaneous TID WC    insulin aspart  5 Units Subcutaneous TID WC   insulin glargine-yfgn  12 Units Subcutaneous QHS   multivitamin  1 tablet Oral QHS   pantoprazole  80 mg Oral Daily   sevelamer carbonate  1,600 mg Oral TID WC   sodium chloride flush  3 mL Intravenous Q12H    Dialysis Orders: [facility] MWF 4h  400/500  90kg  2/2 bath  P2  Heparin 4000 units IV TIW  LUA AVF  Assessment/Plan: L hip infection: S/p recent L hip surgery on 08/10/21 -> persistent infection. Went to OR 08/28/2021 and 08/31/21 for L hip debridement, wound vac in place. Wound Cx + VRE, on Daptomycin. ESRD - on HD MWF. Serial HD 06/28, 6/29 and 6/30. Volume status improved, now trending back up. Low Na suggesting volume excess. K 4.7. Follow trends.  Next HD 7/10.  Access - Difficult cannulation on Friday. VVS consulted -duplex study  HTN/ volume: BP high on admission, now improved. Weight up again. Attempt 4L UF on Monday. May need extra HD next week.   Anemia of ESRD - Hgb declined post op - 2U PRBCs given 6/29. Hgb 7.6.  Holding heparin with HD. Continue Aranesp q Friday. 150 mcg ordered for 7/7. Secondary HPTH: CCa in range, PO4 at goal.  Cont binders.  DM2: Insulin per primary Dispo: Will need to dialyze in recliner prior to discharge. She does not feel ready yet. Will attempt at HD today.  Tobie Poet, NP [facility] 09/09/2021,11:15 AM  LOS: 14 days

## 2021-09-10 DIAGNOSIS — S72142S Displaced intertrochanteric fracture of left femur, sequela: Secondary | ICD-10-CM | POA: Diagnosis not present

## 2021-09-10 DIAGNOSIS — E871 Hypo-osmolality and hyponatremia: Secondary | ICD-10-CM | POA: Diagnosis not present

## 2021-09-10 DIAGNOSIS — E875 Hyperkalemia: Secondary | ICD-10-CM | POA: Diagnosis not present

## 2021-09-10 DIAGNOSIS — M86252 Subacute osteomyelitis, left femur: Secondary | ICD-10-CM | POA: Diagnosis not present

## 2021-09-10 LAB — BASIC METABOLIC PANEL
Anion gap: 9 (ref 5–15)
BUN: 30 mg/dL — ABNORMAL HIGH (ref 6–20)
CO2: 28 mmol/L (ref 22–32)
Calcium: 8.1 mg/dL — ABNORMAL LOW (ref 8.9–10.3)
Chloride: 91 mmol/L — ABNORMAL LOW (ref 98–111)
Creatinine, Ser: 4.03 mg/dL — ABNORMAL HIGH (ref 0.44–1.00)
GFR, Estimated: 13 mL/min — ABNORMAL LOW (ref >60)
Glucose, Bld: 165 mg/dL — ABNORMAL HIGH (ref 70–99)
Potassium: 4 mmol/L (ref 3.5–5.1)
Sodium: 128 mmol/L — ABNORMAL LOW (ref 135–145)

## 2021-09-10 LAB — GLUCOSE, CAPILLARY
Glucose-Capillary: 131 mg/dL — ABNORMAL HIGH (ref 70–99)
Glucose-Capillary: 169 mg/dL — ABNORMAL HIGH (ref 70–99)
Glucose-Capillary: 168 mg/dL — ABNORMAL HIGH (ref 70–99)

## 2021-09-10 MED ORDER — PENTAFLUOROPROP-TETRAFLUOROETH EX AERO: 1.0000 | INHALATION_SPRAY | CUTANEOUS | Status: DC | PRN

## 2021-09-10 MED ORDER — LIDOCAINE HCL (PF) 1 % IJ SOLN
5.0000 mL | INTRAMUSCULAR | Status: DC | PRN
Start: 2021-09-10 — End: 2021-09-11

## 2021-09-10 MED ORDER — ALTEPLASE 2 MG IJ SOLR: 2.0000 mg | Freq: Once | INTRAMUSCULAR | Status: DC | PRN

## 2021-09-10 MED ORDER — HEPARIN SODIUM (PORCINE) 1000 UNIT/ML DIALYSIS: 1000.0000 [IU] | INTRAMUSCULAR | Status: DC | PRN

## 2021-09-10 NOTE — Progress Notes (Signed)
Occupational Therapy Treatment Patient Details Name: [name] MRN: [id number] DOB: [birth year] Today's Date: 09/10/2021   History of present illness 57 y.o. female presented to ED 6/26 from dialysis with increased bloody drainage from L hip wound. 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 s/p hardware removal of left hip with partial resection of tuft tissue and femure that were nonviable on 08/10/21 Discharged home 6/23. underwent L hip debridement and wound vac placement 6/28; +Left intertrochanteric non-union,  Fracture of left distal femur  PMH: hypertension, hyperlipidemia, ESRD on HD MWF, history of left BKA in 2021, depression/anxiety, stroke, tobacco use, T2DM,  insomnia, chronic pain syndrome,   OT comments  Pt making slow progression towards goals, able to sit EOB for ~30 mins for ADLs and mobility attempts. Pt supervision-max A for ADLs, supervision-mod A for bed mobility needing assist to return BLE's into bed. Pt mod-max A +2 for sit to stand transfer with RW. Pt with RLE pain with standing attempt, opted for lateral scoot transfer to chair. Pt scooting ~30% of the way to chair, before declining further transfer due to pain. Encouraged pt to continue as her goal is to return home,  pt verbalized understanding but declines, stating that "my body is saying this is too much". Pt continues to have poor awareness of deficits and safety. Continue to recommend SNF at d/c.   Recommendations for follow up therapy are one component of a multi-disciplinary discharge planning process, led by the attending physician.  Recommendations may be updated based on patient status, additional functional criteria and insurance authorization.    Follow Up Recommendations  Skilled nursing-short term rehab (<3 hours/day)    Assistance Recommended at Discharge Intermittent Supervision/Assistance  Patient can return home with the following  A lot of help with  bathing/dressing/bathroom;Assistance with cooking/housework;Assist for transportation;Help with stairs or ramp for entrance;Two people to help with walking and/or transfers   Equipment Recommendations  None recommended by OT (defer)    Recommendations for Other Services PT consult    Precautions / Restrictions Precautions Precautions: Fall;Other (comment) Precaution Comments: L BKA (baseline), L LE wound vac Restrictions Weight Bearing Restrictions: Yes LLE Weight Bearing: Non weight bearing       Mobility Bed Mobility Overal bed mobility: Needs Assistance Bed Mobility: Sidelying to Sit, Sit to Sidelying   Sidelying to sit: Supervision     Sit to sidelying: Mod assist General bed mobility comments: to assist BLE's into bed    Transfers Overall transfer level: Needs assistance Equipment used: Rolling walker (2 wheels) Transfers: Sit to/from Stand Sit to Stand: Max assist, +2 physical assistance, Mod assist          Lateral/Scoot Transfers: Min guard General transfer comment: attempted sit to stand x1, pt reporting increased R knee pain, opted for lateral scoot transfer, pt able to scoot ~30% of way to chair before declining further transferring due to knee pain and fatigue     Balance Overall balance assessment: Needs assistance Sitting-balance support: Feet supported Sitting balance-Leahy Scale: Good     Standing balance support: Bilateral upper extremity supported, During functional activity, Reliant on assistive device for balance Standing balance-Leahy Scale: Poor Standing balance comment: reliant on external support                           ADL either performed or assessed with clinical judgement   ADL Overall ADL's : Needs assistance/impaired  Upper Body Bathing: Maximal assistance;Sitting Upper Body Bathing Details (indicate cue type and reason): to wash back         Lower Body Dressing: Supervision/safety;Bed level Lower Body  Dressing Details (indicate cue type and reason): dons sock on RLE Toilet Transfer: Maximal assistance;+2 for physical assistance;Moderate assistance Toilet Transfer Details (indicate cue type and reason): for attempts         Functional mobility during ADLs: Maximal assistance;+2 for physical assistance      Extremity/Trunk Assessment Upper Extremity Assessment Upper Extremity Assessment: Overall WFL for tasks assessed;Generalized weakness   Lower Extremity Assessment Lower Extremity Assessment: Defer to PT evaluation        Vision   Vision Assessment?: No apparent visual deficits   Perception Perception Perception: Not tested   Praxis Praxis Praxis: Not tested    Cognition Arousal/Alertness: Awake/alert Behavior During Therapy: WFL for tasks assessed/performed Overall Cognitive Status: Within Functional Limits for tasks assessed Area of Impairment: Safety/judgement                         Safety/Judgement: Decreased awareness of safety, Decreased awareness of deficits     General Comments: pt with increased understanding of need to mobilize, however still self limiting at times, declining help from therapist to assist        Exercises      Shoulder Instructions       General Comments VSS on RA    Pertinent Vitals/ Pain       Pain Assessment Pain Assessment: Faces Pain Score: 7  Faces Pain Scale: Hurts even more Pain Location: R knee with attempts to stand Pain Descriptors / Indicators: Discomfort, Sore Pain Intervention(s): Limited activity within patient's tolerance, Patient requesting pain meds-RN notified, Repositioned  Home Living                                          Prior Functioning/Environment              Frequency  Min 2X/week        Progress Toward Goals  OT Goals(current goals can now be found in the care plan section)  Progress towards OT goals: Progressing toward goals  Acute Rehab OT  Goals Patient Stated Goal: to go home OT Goal Formulation: With patient Time For Goal Achievement: 09/15/21 Potential to Achieve Goals: Fair ADL Goals Pt Will Perform Grooming: with modified independence;sitting Pt Will Perform Lower Body Bathing: with min assist;sitting/lateral leans Pt Will Perform Lower Body Dressing: with min assist;sitting/lateral leans Pt Will Transfer to Toilet: bedside commode;with transfer board;with supervision Pt Will Perform Toileting - Clothing Manipulation and hygiene: with min assist;sitting/lateral leans Pt/caregiver will Perform Home Exercise Program: Increased strength;Both right and left upper extremity;With theraband;Independently;With written HEP provided Additional ADL Goal #1: Pt will perform bed mobility modified independently in preparation for ADLs with HOB flat.  Plan Discharge plan remains appropriate;Frequency remains appropriate    Co-evaluation                 AM-PAC OT "6 Clicks" Daily Activity     Outcome Measure   Help from another person eating meals?: None Help from another person taking care of personal grooming?: A Little Help from another person toileting, which includes using toliet, bedpan, or urinal?: Total Help from another person bathing (including washing, rinsing, drying)?: A Lot Help from another  person to put on and taking off regular upper body clothing?: A Little Help from another person to put on and taking off regular lower body clothing?: A Lot 6 Click Score: 15    End of Session Equipment Utilized During Treatment: Gait belt;Rolling walker (2 wheels)  OT Visit Diagnosis: Muscle weakness (generalized) (M62.81);Unsteadiness on feet (R26.81);History of falling (Z91.81)   Activity Tolerance Patient limited by pain;Other (comment) (self limiting)   Patient Left in bed;with call bell/phone within reach;with bed alarm set;with nursing/sitter in room   Nurse Communication Mobility status;Patient requests pain  meds (notified via securechat)        Time: 2836-6294 OT Time Calculation (min): 46 min  Charges: OT General Charges $OT Visit: 1 Visit OT Treatments $Self Care/Home Management : 8-22 mins $Therapeutic Activity: 23-37 mins  Lynnda Child, OTD, OTR/L Acute Rehab [phone]) [phone] - [place] 09/10/2021, 4:11 PM

## 2021-09-10 NOTE — Progress Notes (Signed)
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 CT scan revealed ununited left inter-trochanteric fracture, soft tissue wound overlying the greater trochanter with air extending from the skin surface to the joint space concerning for infection. Dr. [name] was consulted and the patient underwent an I&D on 6/28 and again on 7/1. Cultures have grown out VRE and the patient is receiving daptomycin per ID. Unable to dc as she declines to sit up in a chair despite numerous encouragement and needs to be able to do so for outpt dialysis  Subjective:  Left hip soreness.  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 Candiss Norse on 7/20- if she is still having a large amount of discharge on dc, per Dr. [name], wound vac can be changed to [facility] and she should be given 2 canisters when she is discharged  Active  Problems:  Swelling of right leg  - chronic- was severe when I first saw her- Elevation and TEDS have helped resolve and to keep swelling down  Hyperkalemia - Lokelma given and improved - recheck tomorrow  Hyponatremia  -  to be managed with dialysis  Anemia of end-stage renal disease-suspected also to have acute blood loss from above wound - On 6/29 was given 2 units of packed red blood cells - Is oozing a mild amount of blood in the wound VAC - follow Hgb intermittently- check again tomorrow and transfuse with dialysis tomorrow if needed      ESRD (end stage renal disease) on dialysis since 11/22 -Continue hemodialysis per nephrology (Monday Wednesday Friday)    Uncontrolled type 2 diabetes mellitus with hypoglycemia, with long-term current use of insulin ([place]) Hemoglobin A1C    Component Value Date/Time   HGBA1C 7.1 (H) 07/22/2021 1446   HGBA1C 7.6 (A) 05/10/2018 1120   - Continue Semglee, NovoLog with meals and NovoLog sliding scale    Benign essential HTN -Continue amlodipine, carvedilol, hydralazine  History of right SFA revascularization    DVT prophylaxis:   heparin injection 5,000 Units Start: 08/26/21 1615     Code Status: Full Code  Consultants: ID, Ortho, nephrology Level of Care: Level of care: Telemetry Medical Disposition Plan:  Status is: Inpatient Remains inpatient appropriate because:  difficult with standing & transfers - has repeatedly declined to get into a dialysis chair- adamantly refuses SNF  -   Objective:   Vitals:   09/09/21 1859 09/09/21 2136 09/10/21 0548 09/10/21 1026  BP:  Marland Kitchen)  124/53 (!) 140/57 136/67  Pulse:  73 67 66  Resp:  16 18 19   Temp:   98.2 F (36.8 C) 99 F (37.2 C)  TempSrc:   Oral   SpO2:  94% 98% 100%  Weight: 98.3 kg     Height:       Filed Weights   09/06/21 1428 09/09/21 1418 09/09/21 1859  Weight: 94.7 kg 99.7 kg 98.3 kg   Exam: General exam: Appears comfortable  HEENT: PERRLA, oral mucosa moist, no  sclera icterus or thrush Respiratory system: Clear to auscultation. Respiratory effort normal. Cardiovascular system: S1 & S2 heard, regular rate and rhythm Gastrointestinal system: Abdomen soft, non-tender, nondistended. Normal bowel sounds   Central nervous system: Alert and oriented. No focal neurological deficits. Extremities: No cyanosis, clubbing or edema- left BKA with wound vac and blood tinged fluid in wound vac Psychiatry:  Mood & affect appropriate.         Imaging and lab data was personally reviewed    CBC: Recent Labs  Lab 09/04/21 0232 09/04/21 0913 09/05/21 0229 09/06/21 0932  WBC 12.4* 12.2* 9.9 8.4  HGB 8.2* 8.0* 7.9* 7.6*  HCT 26.3* 24.8* 25.2* 24.2*  MCV 97.4 95.8 98.1 96.8  PLT 317 321 238 076    Basic Metabolic Panel: Recent Labs  Lab 09/06/21 0549 09/07/21 1257 09/08/21 0257 09/09/21 0036 09/10/21 0334  NA 126* 127* 124* 123* 128*  K 5.6* 4.6 4.7 4.9 4.0  CL 89* 89* 87* 86* 91*  CO2 26 28 26 27 28   GLUCOSE 155* 143* 73 152* 165*  BUN 46* 35* 42* 50* 30*  CREATININE 5.50* 4.69* 5.20* 6.04* 4.03*  CALCIUM 8.6* 8.1* 8.1* 8.3* 8.1*  PHOS 4.4  --   --   --   --     GFR: Estimated Creatinine Clearance: 20.6 mL/min (A) (by C-G formula based on SCr of 4.03 mg/dL (H)).  Scheduled Meds:  alosetron  1 mg Oral BID   amitriptyline  25 mg Oral QHS   amLODipine  5 mg Oral Daily   atorvastatin  40 mg Oral q1800   carvedilol  6.25 mg Oral BID   Chlorhexidine Gluconate Cloth  6 each Topical Q0600   cyclobenzaprine  10 mg Oral TID   darbepoetin (ARANESP) injection - DIALYSIS  150 mcg Intravenous Q Fri-HD   docusate sodium  100 mg Oral BID   DULoxetine  60 mg Oral Daily   heparin  5,000 Units Subcutaneous Q8H   hydrALAZINE  50 mg Oral TID   insulin aspart  0-6 Units Subcutaneous TID WC   insulin aspart  5 Units Subcutaneous TID WC   insulin glargine-yfgn  12 Units Subcutaneous QHS   multivitamin  1 tablet Oral QHS   pantoprazole  80 mg Oral Daily    sevelamer carbonate  1,600 mg Oral TID WC   sodium chloride flush  3 mL Intravenous Q12H   Continuous Infusions:  sodium chloride     sodium chloride Stopped (08/29/21 0139)   sodium chloride     DAPTOmycin (CUBICIN) 900 mg in sodium chloride 0.9 % IVPB Stopped (09/09/21 2230)   methocarbamol (ROBAXIN) IV       LOS: 15 days   Author: Debbe Odea  09/10/2021 1:52 PM

## 2021-09-10 NOTE — Progress Notes (Addendum)
[facility] Progress Note   Subjective:    Seen and examined patient at bedside. She reports tolerating yesterday's HD with no issues cannulating. Noted net UF 3L.   Objective Vitals:   09/09/21 1859 09/09/21 2136 09/10/21 0548 09/10/21 1026  BP:  (!) 124/53 (!) 140/57 136/67  Pulse:  73 67 66  Resp:  16 18 19   Temp:   98.2 F (36.8 C) 99 F (37.2 C)  TempSrc:   Oral   SpO2:  94% 98% 100%  Weight: 98.3 kg     Height:       Physical Exam General: Well-appearing; NAD Heart: S1 and S2; No murmurs, gallops, or rubs Lungs: Clear throughout; No wheezing, rales, or rhonchi Abdomen: Soft and non-tender Extremities: 1+ edema R thigh and RLE-compression stocking in place; L BKA with 1+ trace edema; wound vac to L hip area Dialysis Access: L AVF (+) B/T   Filed Weights   09/06/21 1428 09/09/21 1418 09/09/21 1859  Weight: 94.7 kg 99.7 kg 98.3 kg    Intake/Output Summary (Last 24 hours) at 09/10/2021 1530 Last data filed at 09/10/2021 1043 Gross per 24 hour  Intake 1508 ml  Output 600 ml  Net 908 ml    Additional Objective Labs: Basic Metabolic Panel: Recent Labs  Lab 09/06/21 0549 09/07/21 1257 09/08/21 0257 09/09/21 0036 09/10/21 0334  NA 126*   < > 124* 123* 128*  K 5.6*   < > 4.7 4.9 4.0  CL 89*   < > 87* 86* 91*  CO2 26   < > 26 27 28   GLUCOSE 155*   < > 73 152* 165*  BUN 46*   < > 42* 50* 30*  CREATININE 5.50*   < > 5.20* 6.04* 4.03*  CALCIUM 8.6*   < > 8.1* 8.3* 8.1*  PHOS 4.4  --   --   --   --    < > = values in this interval not displayed.   Liver Function Tests: Recent Labs  Lab 09/04/21 0232 09/06/21 0549  AST 13*  --   ALT <5  --   ALKPHOS 140*  --   BILITOT 0.6  --   PROT 6.4*  --   ALBUMIN 2.4* 2.2*   No results for input(s): "LIPASE", "AMYLASE" in the last 168 hours. CBC: Recent Labs  Lab 09/04/21 0232 09/04/21 0913 09/05/21 0229 09/06/21 0932  WBC 12.4* 12.2* 9.9 8.4  HGB 8.2* 8.0* 7.9* 7.6*  HCT 26.3* 24.8* 25.2*  24.2*  MCV 97.4 95.8 98.1 96.8  PLT 317 321 238 224   Blood Culture    Component Value Date/Time   SDES TISSUE 08/28/2021 0917   SPECREQUEST LEFT THIGH TISSUE 08/28/2021 0917   CULT  08/28/2021 0917    MODERATE ENTEROCOCCUS FAECIUM VANCOMYCIN RESISTANT ENTEROCOCCUS ISOLATED NO ANAEROBES ISOLATED Sent to [place] for further susceptibility testing. Performed at [facility], [place] [address] [place], [place] [address]    REPTSTATUS 09/02/2021 FINAL 08/28/2021 0917    Cardiac Enzymes: Recent Labs  Lab 09/06/21 0549  CKTOTAL 23*   CBG: Recent Labs  Lab 09/09/21 0721 09/09/21 1132 09/09/21 2134 09/10/21 0739 09/10/21 1138  GLUCAP 130* 145* 170* 131* 169*   Iron Studies: No results for input(s): "IRON", "TIBC", "TRANSFERRIN", "FERRITIN" in the last 72 hours. Lab Results  Component Value Date   INR 1.1 12/28/2019   INR 1.2 12/17/2018   INR 0.99 04/07/2017   Studies/Results: No results found.  Medications:  sodium  chloride     sodium chloride Stopped (08/29/21 0139)   sodium chloride     DAPTOmycin (CUBICIN) 900 mg in sodium chloride 0.9 % IVPB Stopped (09/09/21 2230)   methocarbamol (ROBAXIN) IV      alosetron  1 mg Oral BID   amitriptyline  25 mg Oral QHS   amLODipine  5 mg Oral Daily   atorvastatin  40 mg Oral q1800   carvedilol  6.25 mg Oral BID   Chlorhexidine Gluconate Cloth  6 each Topical Q0600   cyclobenzaprine  10 mg Oral TID   darbepoetin (ARANESP) injection - DIALYSIS  150 mcg Intravenous Q Fri-HD   docusate sodium  100 mg Oral BID   DULoxetine  60 mg Oral Daily   heparin  5,000 Units Subcutaneous Q8H   hydrALAZINE  50 mg Oral TID   insulin aspart  0-6 Units Subcutaneous TID WC   insulin aspart  5 Units Subcutaneous TID WC   insulin glargine-yfgn  12 Units Subcutaneous QHS   multivitamin  1 tablet Oral QHS   pantoprazole  80 mg Oral Daily   sevelamer carbonate  1,600 mg Oral TID WC   sodium chloride flush  3 mL Intravenous Q12H     Dialysis Orders: [facility] MWF 4h  400/500  90kg  2/2 bath  P2  Heparin 4000 units IV TIW  LUA AVF  Assessment/Plan: L hip infection: S/p recent L hip surgery on 08/10/21 -> persistent infection. Went to OR 08/28/2021 and 08/31/21 for L hip debridement, wound vac in place. Wound Cx + VRE, on Daptomycin. Reviewed last PCP note: ID recommends continuing Daptomycin 6 weeks with HD with stop date 10/12/21 and f/u with Dr. Candiss Norse on 7/20. ESRD - on HD MWF. Serial HD 06/28, 6/29 and 6/30. Volume status improved, now trending back up. Low Na suggesting volume excess. K 4.7. Follow trends.  Next HD 7/12-push UF as tolerated.  Access - Difficult cannulation on Friday. VVS consulted for duplex study but apparently patient refused. No issues with cannulation during yesterday's HD. Monitor access closely. HTN/ volume: BP high on admission, now improved. Weight up again. Net UF 3L yesterday. May need extra HD next week.   Anemia of ESRD - Hgb declined post op - 2U PRBCs given 6/29. Hgb 7.6.  Holding heparin with HD. Continue Aranesp q Friday. 150 mcg ordered for 7/7. Checking CBC in AM. Secondary HPTH: CCa in range, PO4 at goal.  Cont binders. Checking RFP in AM DM2: Insulin per primary Dispo: Will need to dialyze in recliner prior to discharge. She still does not feel ready. I explained thoroughly of the importance of ensuring she can tolerate being in the recliner here on HD in order for her to continue HD in outpatient. Patient is aware this needs to be done.Spoken to both PT and floor RN today. Plan to start placing her in recliner in her room to increase mobility.      Tobie Poet, NP [facility] 09/10/2021,3:30 PM  LOS: 15 days

## 2021-09-11 DIAGNOSIS — M86252 Subacute osteomyelitis, left femur: Secondary | ICD-10-CM | POA: Diagnosis not present

## 2021-09-11 LAB — CBC
HCT: 24.7 % — ABNORMAL LOW (ref 36.0–46.0)
Hemoglobin: 8 g/dL — ABNORMAL LOW (ref 12.0–15.0)
MCH: 30.5 pg (ref 26.0–34.0)
MCHC: 32.4 g/dL (ref 30.0–36.0)
MCV: 94.3 fL (ref 80.0–100.0)
Platelets: 225 10*3/uL (ref 150–400)
RBC: 2.62 MIL/uL — ABNORMAL LOW (ref 3.87–5.11)
RDW: 16.3 % — ABNORMAL HIGH (ref 11.5–15.5)
WBC: 9.3 10*3/uL (ref 4.0–10.5)
nRBC: 0 % (ref 0.0–0.2)

## 2021-09-11 LAB — GLUCOSE, CAPILLARY
Glucose-Capillary: 133 mg/dL — ABNORMAL HIGH (ref 70–99)
Glucose-Capillary: 146 mg/dL — ABNORMAL HIGH (ref 70–99)
Glucose-Capillary: 194 mg/dL — ABNORMAL HIGH (ref 70–99)
Glucose-Capillary: 110 mg/dL — ABNORMAL HIGH (ref 70–99)

## 2021-09-11 LAB — RENAL FUNCTION PANEL
Albumin: 2.4 g/dL — ABNORMAL LOW (ref 3.5–5.0)
Anion gap: 8 (ref 5–15)
BUN: 39 mg/dL — ABNORMAL HIGH (ref 6–20)
CO2: 25 mmol/L (ref 22–32)
Calcium: 8.2 mg/dL — ABNORMAL LOW (ref 8.9–10.3)
Chloride: 91 mmol/L — ABNORMAL LOW (ref 98–111)
Creatinine, Ser: 4.74 mg/dL — ABNORMAL HIGH (ref 0.44–1.00)
GFR, Estimated: 10 mL/min — ABNORMAL LOW (ref >60)
Glucose, Bld: 160 mg/dL — ABNORMAL HIGH (ref 70–99)
Phosphorus: 4.4 mg/dL (ref 2.5–4.6)
Potassium: 4.5 mmol/L (ref 3.5–5.1)
Sodium: 124 mmol/L — ABNORMAL LOW (ref 135–145)

## 2021-09-11 MED ORDER — DARBEPOETIN ALFA 200 MCG/0.4ML IJ SOSY
200.0000 ug | PREFILLED_SYRINGE | INTRAMUSCULAR | Status: DC
  Administered 2021-09-13 – 2021-09-20 (×2): 200 ug via INTRAVENOUS
  Filled 2021-09-11 (×5): qty 0.4

## 2021-09-11 NOTE — Progress Notes (Signed)
PROGRESS NOTE    [name]  WJX:[id number] DOB: 08-29-1965 DOA: 08/26/2021 PCP: [name], MD     Brief Narrative:  [name] is a 56 year old female with end-stage renal disease on hemodialysis, hypertension, hyperlipidemia, left BKA, history of CVA, left SFA revascularization, depression and anxiety, type 2 diabetes mellitus, tobacco use, chronic pain syndrome, left femur fracture after mechanical fall status post IM nail in 10/22 followed by left hip osteomyelitis and soft tissue abscesses status post multiple surgeries including removal of hardware, I&D and partial resection of the femur, receiving cefazolin on HD who presented to the hospital on 6/26 with bloody drainage from the surgical incision site. CT scan revealed ununited left inter-trochanteric fracture, soft tissue wound overlying the greater trochanter with air extending from the skin surface to the joint space concerning for infection. Dr. [name] was consulted and the patient underwent an I&D on 6/28 and again on 7/1. Cultures have grown out VRE and the patient is receiving daptomycin per ID. Unable to dc as she declines to sit up in a chair despite numerous encouragement and needs to be able to do so for outpt dialysis  New events last 24 hours / Subjective: Patient underwent dialysis today, did not get into a recliner.  We discussed the importance of continued mobility and working with physical therapy.  I discussed with her that we both share a goal and the ultimate goal is for patient to improve for her to be discharged home as patient is declining SNF placement.  She voiced her dissatisfaction and frustration at the fact that staff is pushing her to get to a chair and it feels that this is all that we are focused on.  She wants the swelling of her lower extremities and pain to improve so that she could work on getting to a chair eventually, on her timeline  Assessment & Plan:  Principal Problem:   Subacute  osteomyelitis of left femur with abscess  Active Problems:   Closed intertrochanteric fracture of hip, left, sequela   ESRD (end stage renal disease) on dialysis ([place])   Uncontrolled type 2 diabetes mellitus with hypoglycemia, with long-term current use of insulin ([place])   History of CVA (cerebrovascular accident)   Benign essential HTN   Dyslipidemia, goal LDL below 100   Anxiety and depression   GASTROESOPHAGEAL REFLUX, NO ESOPHAGITIS   TOBACCO DEPENDENCE   Wound infection   Iron deficiency anemia, unspecified   Wound dehiscence, surgical, sequela   Left femoral shaft fracture ([facility])   Subcutaneous osteomyelitis of the left femur with abscess, infected left hip hardware status post removal, left intertrochanteric fracture -Wound VAC to the left outer thigh -Infectious disease recommending daptomycin for 6 weeks with dialysis, stop date 8/12.  Follow-up with Dr. Candiss Norse 7/20  ESRD -Dialysis MWF.  Patient needs to be able to sit in chair to tolerate outpatient dialysis for discharge -Nephrology following  Anemia of chronic disease, component of acute blood loss anemia from wound -Status post 2 unit packed red blood cells 6/29 -Hemoglobin stable today  Uncontrolled type 2 diabetes with hypoglycemia -A1c 7.1 -Semglee, NovoLog, sliding scale  Hypertension -Amlodipine, Coreg, hydralazine  Hyperlipidemia -Lipitor  DVT prophylaxis:  Place TED hose Start: 09/06/21 1507 SCDs Start: 08/31/21 1058 SCDs Start: 08/28/21 1049 heparin injection 5,000 Units Start: 08/26/21 1615  Code Status: Full code Family Communication: No family at bedside Disposition Plan:  Status is: Inpatient Remains inpatient appropriate because: Patient needs to continue to work on sitting in  chair for dialysis.  She is declining SNF placement.   Antimicrobials:  Anti-infectives (From admission, onward)    Start     Dose/Rate Route Frequency Ordered Stop   09/02/21 2000  DAPTOmycin (CUBICIN) 900 mg in  sodium chloride 0.9 % IVPB        10 mg/kg  92.4 kg 136 mL/hr over 30 Minutes Intravenous Once per day on Mon Wed Fri 09/02/21 0836 10/12/21 2359   08/31/21 0745  ceFAZolin (ANCEF) IVPB 2g/100 mL premix        2 g 200 mL/hr over 30 Minutes Intravenous To Short Stay 08/31/21 0730 08/31/21 0918   08/30/21 0600  ceFAZolin (ANCEF) IVPB 2g/100 mL premix  Status:  Discontinued        2 g 200 mL/hr over 30 Minutes Intravenous To Short Stay 08/30/21 0123 08/31/21 0600   08/29/21 2000  DAPTOmycin (CUBICIN) 900 mg in sodium chloride 0.9 % IVPB  Status:  Discontinued        10 mg/kg  92.4 kg 136 mL/hr over 30 Minutes Intravenous Every 48 hours 08/29/21 1627 09/02/21 0836   08/28/21 1800  ceFAZolin (ANCEF) IVPB 2g/100 mL premix  Status:  Discontinued        2 g 200 mL/hr over 30 Minutes Intravenous Every M-W-F (1800) 08/27/21 0846 08/29/21 1620   08/28/21 1200  ceFAZolin (ANCEF) IVPB 1 g/50 mL premix  Status:  Discontinued        1 g 100 mL/hr over 30 Minutes Intravenous Every M-W-F (Hemodialysis) 08/26/21 1954 08/27/21 0846   08/28/21 0600  ceFAZolin (ANCEF) IVPB 2g/100 mL premix        2 g 200 mL/hr over 30 Minutes Intravenous On call to O.R. 08/27/21 1150 08/28/21 0909   08/27/21 1800  ceFAZolin (ANCEF) IVPB 1 g/50 mL premix        1 g 100 mL/hr over 30 Minutes Intravenous  Once 08/26/21 1954 08/27/21 2048   08/26/21 1500  ceFEPIme (MAXIPIME) 1 g in sodium chloride 0.9 % 100 mL IVPB  Status:  Discontinued        1 g 200 mL/hr over 30 Minutes Intravenous Every 24 hours 08/26/21 1452 08/26/21 1714        Objective: Vitals:   09/11/21 1159 09/11/21 1230 09/11/21 1300 09/11/21 1354  BP: (!) 145/60 116/82 (!) 147/60 (!) 130/54  Pulse:  66 68 70  Resp: 13 11 19 18   Temp:   97.9 F (36.6 C)   TempSrc:   Oral   SpO2: 100% 100% 100% 100%  Weight:   96 kg   Height:        Intake/Output Summary (Last 24 hours) at 09/11/2021 1602 Last data filed at 09/11/2021 1300 Gross per 24 hour   Intake 460 ml  Output 3020 ml  Net -2560 ml   Filed Weights   09/09/21 1859 09/11/21 0830 09/11/21 1300  Weight: 98.3 kg 99.2 kg 96 kg    Examination:  General exam: Appears calm and comfortable  Respiratory system: Clear to auscultation. Respiratory effort normal. No respiratory distress. No conversational dyspnea.  Cardiovascular system: S1 & S2 heard, RRR. No murmurs. + Bilateral lower extremity edema, TED hose on right lower extremity Gastrointestinal system: Abdomen is nondistended, soft and nontender. Normal bowel sounds heard. Central nervous system: Alert and oriented. No focal neurological deficits. Speech clear.  Extremities: Left lower extremity status post BKA, wound VAC in place Psychiatry: Judgement and insight appear normal. Mood & affect appropriate.   Data Reviewed: I  have personally reviewed following labs and imaging studies  CBC: Recent Labs  Lab 09/05/21 0229 09/06/21 0932 09/11/21 0323  WBC 9.9 8.4 9.3  HGB 7.9* 7.6* 8.0*  HCT 25.2* 24.2* 24.7*  MCV 98.1 96.8 94.3  PLT 238 224 539   Basic Metabolic Panel: Recent Labs  Lab 09/06/21 0549 09/07/21 1257 09/08/21 0257 09/09/21 0036 09/10/21 0334 09/11/21 0323  NA 126* 127* 124* 123* 128* 124*  K 5.6* 4.6 4.7 4.9 4.0 4.5  CL 89* 89* 87* 86* 91* 91*  CO2 26 28 26 27 28 25   GLUCOSE 155* 143* 73 152* 165* 160*  BUN 46* 35* 42* 50* 30* 39*  CREATININE 5.50* 4.69* 5.20* 6.04* 4.03* 4.74*  CALCIUM 8.6* 8.1* 8.1* 8.3* 8.1* 8.2*  PHOS 4.4  --   --   --   --  4.4   GFR: Estimated Creatinine Clearance: 17.3 mL/min (A) (by C-G formula based on SCr of 4.74 mg/dL (H)). Liver Function Tests: Recent Labs  Lab 09/06/21 0549 09/11/21 0323  ALBUMIN 2.2* 2.4*   No results for input(s): "LIPASE", "AMYLASE" in the last 168 hours. No results for input(s): "AMMONIA" in the last 168 hours. Coagulation Profile: No results for input(s): "INR", "PROTIME" in the last 168 hours. Cardiac Enzymes: Recent Labs  Lab  09/06/21 0549  CKTOTAL 23*   BNP (last 3 results) No results for input(s): "PROBNP" in the last 8760 hours. HbA1C: No results for input(s): "HGBA1C" in the last 72 hours. CBG: Recent Labs  Lab 09/10/21 0739 09/10/21 1138 09/10/21 1707 09/11/21 0736 09/11/21 1352  GLUCAP 131* 169* 168* 133* 146*   Lipid Profile: No results for input(s): "CHOL", "HDL", "LDLCALC", "TRIG", "CHOLHDL", "LDLDIRECT" in the last 72 hours. Thyroid Function Tests: No results for input(s): "TSH", "T4TOTAL", "FREET4", "T3FREE", "THYROIDAB" in the last 72 hours. Anemia Panel: No results for input(s): "VITAMINB12", "FOLATE", "FERRITIN", "TIBC", "IRON", "RETICCTPCT" in the last 72 hours. Sepsis Labs: No results for input(s): "PROCALCITON", "LATICACIDVEN" in the last 168 hours.  No results found for this or any previous visit (from the past 240 hour(s)).    Radiology Studies: No results found.    Scheduled Meds:  alosetron  1 mg Oral BID   amitriptyline  25 mg Oral QHS   amLODipine  5 mg Oral Daily   atorvastatin  40 mg Oral q1800   carvedilol  6.25 mg Oral BID   Chlorhexidine Gluconate Cloth  6 each Topical Q0600   cyclobenzaprine  10 mg Oral TID   [START ON 09/13/2021] darbepoetin (ARANESP) injection - DIALYSIS  200 mcg Intravenous Q Fri-HD   docusate sodium  100 mg Oral BID   DULoxetine  60 mg Oral Daily   heparin  5,000 Units Subcutaneous Q8H   hydrALAZINE  50 mg Oral TID   insulin aspart  0-6 Units Subcutaneous TID WC   insulin aspart  5 Units Subcutaneous TID WC   insulin glargine-yfgn  12 Units Subcutaneous QHS   multivitamin  1 tablet Oral QHS   pantoprazole  80 mg Oral Daily   sevelamer carbonate  1,600 mg Oral TID WC   sodium chloride flush  3 mL Intravenous Q12H   Continuous Infusions:  sodium chloride     sodium chloride Stopped (08/29/21 0139)   sodium chloride     DAPTOmycin (CUBICIN) 900 mg in sodium chloride 0.9 % IVPB Stopped (09/09/21 2230)   methocarbamol (ROBAXIN) IV        LOS: 16 days     Dessa Phi,  DO [facility] 09/11/2021, 4:02 PM   Available via Epic secure chat 7am-7pm After these hours, please refer to coverage provider listed on [email or web address]

## 2021-09-11 NOTE — Progress Notes (Signed)
Patients treatment has to be terminated. Patient clotted off. Patient has 42 minutes left. NP Tobie Poet notified off the events. Vanc could not be given due to patient clotting off. Will notify floor nurse.

## 2021-09-11 NOTE — Progress Notes (Signed)
Physical Therapy Note   09/11/21 0800  PT Visit Information  Last PT Received On 09/11/21  Reason Eval/Treat Not Completed Patient at procedure or test/unavailable    Patient at HD - historically has refused PT following HD but will check with her this afternoon and encourage her to participate as tolerated.  Candie Mile, PT

## 2021-09-11 NOTE — Progress Notes (Signed)
[facility] Progress Note   Subjective:    Seen and examined on HD. No acute complaints. Tolerating UFG 4L.   Objective Vitals:   09/11/21 0840 09/11/21 0855 09/11/21 0930 09/11/21 1000  BP: 131/71 135/62 124/68 (!) 131/57  Pulse: 71 66 68   Resp: (!) 22 10 10    Temp: 97.9 F (36.6 C)     TempSrc: Oral     SpO2: 100% 99% 99% 100%  Weight:      Height:       Physical Exam General: Well-appearing; NAD Heart: S1 and S2; No murmurs, gallops, or rubs Lungs: Clear throughout; No wheezing, rales, or rhonchi Abdomen: Soft and non-tender Extremities: 1+ edema R thigh and RLE-compression stocking in place; L BKA with 1+ trace edema; wound vac to L hip area Dialysis Access: L AVF (+) B/T   Filed Weights   09/09/21 1418 09/09/21 1859 09/11/21 0830  Weight: 99.7 kg 98.3 kg 99.2 kg    Intake/Output Summary (Last 24 hours) at 09/11/2021 1032 Last data filed at 09/11/2021 0700 Gross per 24 hour  Intake 940 ml  Output 20 ml  Net 920 ml    Additional Objective Labs: Basic Metabolic Panel: Recent Labs  Lab 09/06/21 0549 09/07/21 1257 09/09/21 0036 09/10/21 0334 09/11/21 0323  NA 126*   < > 123* 128* 124*  K 5.6*   < > 4.9 4.0 4.5  CL 89*   < > 86* 91* 91*  CO2 26   < > 27 28 25   GLUCOSE 155*   < > 152* 165* 160*  BUN 46*   < > 50* 30* 39*  CREATININE 5.50*   < > 6.04* 4.03* 4.74*  CALCIUM 8.6*   < > 8.3* 8.1* 8.2*  PHOS 4.4  --   --   --  4.4   < > = values in this interval not displayed.   Liver Function Tests: Recent Labs  Lab 09/06/21 0549 09/11/21 0323  ALBUMIN 2.2* 2.4*   No results for input(s): "LIPASE", "AMYLASE" in the last 168 hours. CBC: Recent Labs  Lab 09/05/21 0229 09/06/21 0932 09/11/21 0323  WBC 9.9 8.4 9.3  HGB 7.9* 7.6* 8.0*  HCT 25.2* 24.2* 24.7*  MCV 98.1 96.8 94.3  PLT 238 224 225   Blood Culture    Component Value Date/Time   SDES TISSUE 08/28/2021 0917   SPECREQUEST LEFT THIGH TISSUE 08/28/2021 0917   CULT   08/28/2021 0917    MODERATE ENTEROCOCCUS FAECIUM VANCOMYCIN RESISTANT ENTEROCOCCUS ISOLATED NO ANAEROBES ISOLATED Sent to [facility] for further susceptibility testing. Performed at [facility], [place] [address] [place], [place] [address]    REPTSTATUS 09/02/2021 FINAL 08/28/2021 0917    Cardiac Enzymes: Recent Labs  Lab 09/06/21 0549  CKTOTAL 23*   CBG: Recent Labs  Lab 09/09/21 2134 09/10/21 0739 09/10/21 1138 09/10/21 1707 09/11/21 0736  GLUCAP 170* 131* 169* 168* 133*   Iron Studies: No results for input(s): "IRON", "TIBC", "TRANSFERRIN", "FERRITIN" in the last 72 hours. Lab Results  Component Value Date   INR 1.1 12/28/2019   INR 1.2 12/17/2018   INR 0.99 04/07/2017   Studies/Results: No results found.  Medications:  sodium chloride     sodium chloride Stopped (08/29/21 0139)   sodium chloride     DAPTOmycin (CUBICIN) 900 mg in sodium chloride 0.9 % IVPB Stopped (09/09/21 2230)   methocarbamol (ROBAXIN) IV      alosetron  1 mg Oral BID   amitriptyline  25 mg Oral QHS   amLODipine  5 mg Oral Daily   atorvastatin  40 mg Oral q1800   carvedilol  6.25 mg Oral BID   Chlorhexidine Gluconate Cloth  6 each Topical Q0600   cyclobenzaprine  10 mg Oral TID   darbepoetin (ARANESP) injection - DIALYSIS  150 mcg Intravenous Q Fri-HD   docusate sodium  100 mg Oral BID   DULoxetine  60 mg Oral Daily   heparin  5,000 Units Subcutaneous Q8H   hydrALAZINE  50 mg Oral TID   insulin aspart  0-6 Units Subcutaneous TID WC   insulin aspart  5 Units Subcutaneous TID WC   insulin glargine-yfgn  12 Units Subcutaneous QHS   multivitamin  1 tablet Oral QHS   pantoprazole  80 mg Oral Daily   sevelamer carbonate  1,600 mg Oral TID WC   sodium chloride flush  3 mL Intravenous Q12H    Dialysis Orders: [facility] MWF 4h  400/500  90kg  2/2 bath  P2  Heparin 4000 units IV TIW  LUA AVF  Assessment/Plan: L hip infection: S/p recent L hip surgery on 08/10/21 -> persistent infection.  Went to OR 08/28/2021 and 08/31/21 for L hip debridement, wound vac in place. Wound Cx + VRE, on Daptomycin. Reviewed last PCP note: ID recommends continuing Daptomycin 6 weeks with HD with stop date 10/12/21 and f/u with Dr. Candiss Norse on 7/20. ESRD - on HD MWF. Serial HD 06/28, 6/29 and 6/30. Volume status improved, now trending back up. Low Na suggesting volume excess. K 4.7. Follow trends.  On HD-push UF as tolerated.  Access - Difficult cannulation on Friday. VVS consulted for duplex study but apparently patient refused. No issues with cannulation during yesterday's HD. Monitor access closely. HTN/ volume: BP high on admission, now improved. Weight up again. Net UF 3L yesterday. Will assess weights after HD today. May need extra HD next week.   Anemia of ESRD - Hgb declined post op - 2U PRBCs given 6/29. Hgb 8.0.  Holding heparin with HD. Will raise ESA. Monitor trend. Secondary HPTH: CCa in range, PO4 at goal.  Cont binders. Checking RFP in AM DM2: Insulin per primary Dispo: Will need to dialyze in recliner prior to discharge. She still does not feel ready. I explained thoroughly of the importance of ensuring she can tolerate being in the recliner here on HD in order for her to continue HD in outpatient. Patient is aware this needs to be done.Spoken to both PT and floor RN today. Plan to start placing her in recliner in her room to increase mobility.    Tobie Poet, NP [facility] 09/11/2021,10:32 AM  LOS: 16 days

## 2021-09-11 NOTE — Progress Notes (Signed)
Pharmacy Antibiotic Note  [name] is a 56 y.o. 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 Prior intra-op cultures in May grew MSSA, now with intra-op cultures this admission growing Enterococcus faecium (VRE). Pharmacy has been consulted for Daptomycin dosing.  Noted pt is ESRD and her daptomycin is given after dialysis MWF  CK 23 on 7/7  Plan: - Daptomycin 900 mg (10 mg/kg TBW) IV every MWF after HD, anticipated stop date 10/12/21 per ID - Continue with weekly CK monitoring on Fridays - Will continue to follow HD schedule/duration, LOT, and antibiotic de-escalation plans   Height: 5' 10.98" (180.3 cm) Weight: 99.2 kg (218 lb 11.1 oz) IBW/kg (Calculated) : 70.76  Temp (24hrs), Avg:98.2 F (36.8 C), Min:97.9 F (36.6 C), Max:98.9 F (37.2 C)  Recent Labs  Lab 09/05/21 0229 09/06/21 0549 09/06/21 0932 09/07/21 1257 09/08/21 0257 09/09/21 0036 09/10/21 0334 09/11/21 0323  WBC 9.9  --  8.4  --   --   --   --  9.3  CREATININE 4.57*   < >  --  4.69* 5.20* 6.04* 4.03* 4.74*   < > = values in this interval not displayed.     Estimated Creatinine Clearance: 17.6 mL/min (A) (by C-G formula based on SCr of 4.74 mg/dL (H)).    Allergies  Allergen Reactions   Trazodone Swelling   Latex Rash   Lidocaine Itching    Legrand Como, PharmD, BCPS, BCIDP Clinical Pharmacist Phone: [phone] Please check AMION for all [facility] numbers 09/11/2021, 11:47 AM

## 2021-09-11 NOTE — Progress Notes (Signed)
PT Cancellation Note  Patient Details Name: [name] MRN: [id number] DOB: 02-13-1966   Cancelled Treatment:    Reason Eval/Treat Not Completed: Patient declined, states she will not see therapists on dialysis days (MWF) Explained importance of working with acute rehab team more frequently to make appreciable progress towards goals. Expresses multiple concerns working with therapy: "It just takes time," "you're rushing me too much," I'm afraid of falling," I need to stand on my weak leg," "I need to have my pain controlled." Will attempt follow-up tomorrow.   [name] 09/11/2021, 2:55 PM

## 2021-09-12 DIAGNOSIS — M86252 Subacute osteomyelitis, left femur: Secondary | ICD-10-CM | POA: Diagnosis not present

## 2021-09-12 LAB — GLUCOSE, CAPILLARY
Glucose-Capillary: 112 mg/dL — ABNORMAL HIGH (ref 70–99)
Glucose-Capillary: 129 mg/dL — ABNORMAL HIGH (ref 70–99)
Glucose-Capillary: 142 mg/dL — ABNORMAL HIGH (ref 70–99)
Glucose-Capillary: 156 mg/dL — ABNORMAL HIGH (ref 70–99)

## 2021-09-12 NOTE — Progress Notes (Signed)
Physical Therapy Treatment Patient Details Name: [name] MRN: [id number] DOB: August 27, 1965 Today's Date: 09/12/2021   History of Present Illness 56 y.o. female presented to ED 6/26 from dialysis with increased bloody drainage from L hip wound. 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 s/p hardware removal of left hip with partial resection of tuft tissue and femure that were nonviable on 08/10/21 Discharged home 6/23. underwent L hip debridement and wound vac placement 6/28; +Left intertrochanteric non-union,  Fracture of left distal femur  PMH: hypertension, hyperlipidemia, ESRD on HD MWF, history of left BKA in 2021, depression/anxiety, stroke, tobacco use, T2DM,  insomnia, chronic pain syndrome,    PT Comments    Patient willing to perform exercises, sit EOB, and sit to stand x 4 reps with +2 mod/max assist. Again did not want to get into chair (she feels this is being pushed on her to get her ready to go home and she does not feel ready to go home yet).  Discussed that we are all on the same page and want her to be strong enough to go home when doctors say she is medically ready to discharge. She reiterates the reasons she is not ready to go home (mostly related to edema RLE and abdomen).    Recommendations for follow up therapy are one component of a multi-disciplinary discharge planning process, led by the attending physician.  Recommendations may be updated based on patient status, additional functional criteria and insurance authorization.  Follow Up Recommendations  Skilled nursing-short term rehab (<3 hours/day) (pt refusing SNF and will need max HH services if goes home) Can patient physically be transported by private vehicle: No   Assistance Recommended at Discharge Intermittent Supervision/Assistance  Patient can return home with the following Assistance with cooking/housework;Assist for transportation;Help with stairs or ramp for entrance;Two  people to help with walking and/or transfers;Two people to help with bathing/dressing/bathroom;Direct supervision/assist for medications management   Equipment Recommendations  None recommended by PT (?will need transportation to/from HD in wheelchair (pt trying to arrange to have a friend assist her in/out of her car))    Recommendations for Other Services       Precautions / Restrictions Precautions Precautions: Fall;Other (comment) Precaution Comments: L BKA (baseline), L LE wound vac Restrictions Weight Bearing Restrictions: Yes LLE Weight Bearing: Non weight bearing Other Position/Activity Restrictions: L distal femur fx--non union     Mobility  Bed Mobility Overal bed mobility: Needs Assistance Bed Mobility: Sidelying to Sit, Sit to Sidelying Rolling: Modified independent (Device/Increase time) (use of rails) Sidelying to sit: Supervision   Sit to supine: Supervision   General bed mobility comments: increased time and effort    Transfers Overall transfer level: Needs assistance Equipment used: Rolling walker (2 wheels) Transfers: Sit to/from Stand Sit to Stand: Max assist, +2 physical assistance, Mod assist           General transfer comment: 3 stands performed from EOB with max to mod/max assist +2 to stand with RW.    Ambulation/Gait               General Gait Details: non ambulatory at baseline   Stairs             Wheelchair Mobility    Modified Rankin (Stroke Patients Only)       Balance Overall balance assessment: Needs assistance Sitting-balance support: Feet supported Sitting balance-Leahy Scale: Good Sitting balance - Comments: ability to transfer weight left and right in sitting  Standing balance support: Bilateral upper extremity supported, During functional activity, Reliant on assistive device for balance Standing balance-Leahy Scale: Poor Standing balance comment: reliant on external support rolerated up to 50 seconds  of standing for third stand                            Cognition Arousal/Alertness: Awake/alert Behavior During Therapy: WFL for tasks assessed/performed Overall Cognitive Status: No family/caregiver present to determine baseline cognitive functioning Area of Impairment: Safety/judgement                         Safety/Judgement: Decreased awareness of safety, Decreased awareness of deficits     General Comments: more motivated to attempt standing at bedside; still feels she will be able to drive herself to HD (and can't even transfer to chair)        Exercises General Exercises - Lower Extremity Ankle Circles/Pumps: AROM, Right, 20 reps Long Arc Quad: AROM, Right, 20 reps, Seated Heel Slides: AAROM, Right, 10 reps Hip ABduction/ADduction: Strengthening, Right, 10 reps Straight Leg Raises: AROM, Right, 10 reps Other Exercises Other Exercises: LLE internal rotation x 10 reps with pt unable to achieve neutral rotation (tight external rotators)    General Comments        Pertinent Vitals/Pain Pain Assessment Pain Assessment: 0-10 Pain Score: 7  Pain Location: R knee with attempts to stand; left knee with any movement Pain Descriptors / Indicators: Discomfort, Sore Pain Intervention(s): Limited activity within patient's tolerance, Monitored during session, Premedicated before session    Home Living                          Prior Function            PT Goals (current goals can now be found in the care plan section) Acute Rehab PT Goals Patient Stated Goal: less pain with movement PT Goal Formulation: With patient Time For Goal Achievement: 09/26/21 Potential to Achieve Goals: Fair Progress towards PT goals: Progressing toward goals (goals updated based on timeframe)    Frequency    Min 3X/week      PT Plan Current plan remains appropriate    Co-evaluation PT/OT/SLP Co-Evaluation/Treatment: Yes Reason for Co-Treatment:  Complexity of the patient's impairments (multi-system involvement);For patient/therapist safety;To address functional/ADL transfers PT goals addressed during session: Mobility/safety with mobility;Balance;Proper use of DME;Strengthening/ROM OT goals addressed during session: Strengthening/ROM      AM-PAC PT "6 Clicks" Mobility   Outcome Measure  Help needed turning from your back to your side while in a flat bed without using bedrails?: A Little Help needed moving from lying on your back to sitting on the side of a flat bed without using bedrails?: A Little Help needed moving to and from a bed to a chair (including a wheelchair)?: Total Help needed standing up from a chair using your arms (e.g., wheelchair or bedside chair)?: Total Help needed to walk in hospital room?: Total Help needed climbing 3-5 steps with a railing? : Total 6 Click Score: 10    End of Session Equipment Utilized During Treatment: Gait belt Activity Tolerance: Patient tolerated treatment well Patient left: in bed;with call bell/phone within reach;with bed alarm set Nurse Communication: Mobility status PT Visit Diagnosis: Other abnormalities of gait and mobility (R26.89);Muscle weakness (generalized) (M62.81);History of falling (Z91.81);Pain Pain - Right/Left: Left Pain - part of body: Leg  Time: 4327-6147 PT Time Calculation (min) (ACUTE ONLY): 44 min  Charges:  $Therapeutic Exercise: 8-22 mins                      Arby Barrette, PT Acute Rehabilitation Services  Office [phone]    [name] 09/12/2021, 2:45 PM

## 2021-09-12 NOTE — Progress Notes (Signed)
[facility] Progress Note   Subjective:    Seen and examined patient at bedside. Informed by HD RN of HD machine clotting off during yesterday's HD. Removed 3L. Plan for HD tomorrow in the recliner.  Objective Vitals:   09/11/21 1717 09/11/21 2126 09/12/21 0550 09/12/21 0902  BP: (!) 148/64 (!) 129/55 (!) 140/57 (!) 144/65  Pulse: 69 68 67 72  Resp: 18 18 16 18   Temp: 97.8 F (36.6 C) 98.5 F (36.9 C) 98.1 F (36.7 C) 98.5 F (36.9 C)  TempSrc: Oral Oral Oral   SpO2: 99% 98% 94% 100%  Weight:      Height:       Physical Exam General: Well-appearing; NAD Heart: S1 and S2; No murmurs, gallops, or rubs Lungs: Clear throughout; No wheezing, rales, or rhonchi Abdomen: Soft and non-tender Extremities: 1+ edema R thigh and RLE-compression stocking in place; L BKA with 1+ trace edema; wound vac to L hip area Dialysis Access: L AVF (+) B/T   Filed Weights   09/09/21 1859 09/11/21 0830 09/11/21 1300  Weight: 98.3 kg 99.2 kg 96 kg    Intake/Output Summary (Last 24 hours) at 09/12/2021 1053 Last data filed at 09/12/2021 0900 Gross per 24 hour  Intake 1506 ml  Output 4100 ml  Net -2594 ml    Additional Objective Labs: Basic Metabolic Panel: Recent Labs  Lab 09/06/21 0549 09/07/21 1257 09/09/21 0036 09/10/21 0334 09/11/21 0323  NA 126*   < > 123* 128* 124*  K 5.6*   < > 4.9 4.0 4.5  CL 89*   < > 86* 91* 91*  CO2 26   < > 27 28 25   GLUCOSE 155*   < > 152* 165* 160*  BUN 46*   < > 50* 30* 39*  CREATININE 5.50*   < > 6.04* 4.03* 4.74*  CALCIUM 8.6*   < > 8.3* 8.1* 8.2*  PHOS 4.4  --   --   --  4.4   < > = values in this interval not displayed.   Liver Function Tests: Recent Labs  Lab 09/06/21 0549 09/11/21 0323  ALBUMIN 2.2* 2.4*   No results for input(s): "LIPASE", "AMYLASE" in the last 168 hours. CBC: Recent Labs  Lab 09/06/21 0932 09/11/21 0323  WBC 8.4 9.3  HGB 7.6* 8.0*  HCT 24.2* 24.7*  MCV 96.8 94.3  PLT 224 225   Blood Culture     Component Value Date/Time   SDES TISSUE 08/28/2021 0917   SPECREQUEST LEFT THIGH TISSUE 08/28/2021 0917   CULT  08/28/2021 0917    MODERATE ENTEROCOCCUS FAECIUM VANCOMYCIN RESISTANT ENTEROCOCCUS ISOLATED NO ANAEROBES ISOLATED Sent to [facility] for further susceptibility testing. Performed at [facility], [place] [address], [place], [place] [address]    REPTSTATUS 09/02/2021 FINAL 08/28/2021 0917    Cardiac Enzymes: Recent Labs  Lab 09/06/21 0549  CKTOTAL 23*   CBG: Recent Labs  Lab 09/11/21 0736 09/11/21 1352 09/11/21 1603 09/11/21 2126 09/12/21 0742  GLUCAP 133* 146* 194* 110* 112*   Iron Studies: No results for input(s): "IRON", "TIBC", "TRANSFERRIN", "FERRITIN" in the last 72 hours. Lab Results  Component Value Date   INR 1.1 12/28/2019   INR 1.2 12/17/2018   INR 0.99 04/07/2017   Studies/Results: No results found.  Medications:  sodium chloride     sodium chloride Stopped (08/29/21 0139)   sodium chloride     DAPTOmycin (CUBICIN) 900 mg in sodium chloride 0.9 % IVPB 900 mg (09/11/21 2224)  methocarbamol (ROBAXIN) IV      alosetron  1 mg Oral BID   amitriptyline  25 mg Oral QHS   amLODipine  5 mg Oral Daily   atorvastatin  40 mg Oral q1800   carvedilol  6.25 mg Oral BID   Chlorhexidine Gluconate Cloth  6 each Topical Q0600   cyclobenzaprine  10 mg Oral TID   [START ON 09/13/2021] darbepoetin (ARANESP) injection - DIALYSIS  200 mcg Intravenous Q Fri-HD   docusate sodium  100 mg Oral BID   DULoxetine  60 mg Oral Daily   heparin  5,000 Units Subcutaneous Q8H   hydrALAZINE  50 mg Oral TID   insulin aspart  0-6 Units Subcutaneous TID WC   insulin aspart  5 Units Subcutaneous TID WC   insulin glargine-yfgn  12 Units Subcutaneous QHS   multivitamin  1 tablet Oral QHS   pantoprazole  80 mg Oral Daily   sevelamer carbonate  1,600 mg Oral TID WC   sodium chloride flush  3 mL Intravenous Q12H    Dialysis Orders: [facility] MWF 4h  400/500  90kg  2/2 bath   P2  Heparin 4000 units IV TIW  LUA AVF  Assessment/Plan: L hip infection: S/p recent L hip surgery on 08/10/21 -> persistent infection. Went to OR 08/28/2021 and 08/31/21 for L hip debridement, wound vac in place. Wound Cx + VRE, on Daptomycin. Reviewed last PCP note: ID recommends continuing Daptomycin 6 weeks with HD with stop date 10/12/21 and f/u with Dr. Candiss Norse on 7/20. ESRD - on HD MWF. Serial HD 06/28, 6/29 and 6/30. Volume status improved, now trending back up. Low Na suggesting volume excess. K 4.7. Follow trends. HD machine clotted off yesterday near end of treatment. We've been holding Heparin d/t current anemia. May need to try sodium citrate as an alternative.  Access - Difficult cannulation on Friday. VVS consulted for duplex study but apparently patient refused. No issues with cannulation lately. Monitor access closely. HTN/ volume: BP high on admission, now improved. Weight up again. Net UF 3L yesterday. Will assess weights after HD today. May need extra HD this week (Saturday 7/15).   Anemia of ESRD - Hgb declined post op - 2U PRBCs given 6/29. Hgb 8.0.  Holding heparin with HD. ESA recently raised. Monitor trend. Secondary HPTH: CCa in range, PO4 at goal.  Cont binders. Checking RFP in AM DM2: Insulin per primary Dispo: Will need to dialyze in recliner prior to discharge. I explained thoroughly of the importance of ensuring she can tolerate being in the recliner here on HD in order for her to continue HD in outpatient. Patient is aware this needs to be done. Plan to try placing patient in the recliner for HD tomorrow. I will discuss this with both the floor RN and HD unit today so staff can prepare.  Tobie Poet, NP [facility] 09/12/2021,10:53 AM  LOS: 17 days

## 2021-09-12 NOTE — Progress Notes (Signed)
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 CT scan revealed ununited left inter-trochanteric fracture, soft tissue wound overlying the greater trochanter with air extending from the skin surface to the joint space concerning for infection. Dr. [name] was consulted and the patient underwent an I&D on 6/28 and again on 7/1. Cultures have grown out VRE and the patient is receiving daptomycin per ID. Unable to dc as she declines to sit up in a chair despite numerous encouragement and needs to be able to do so for outpt dialysis  New events last 24 hours / Subjective: Patient without any new physical complaints.  Patient agreeable to making baby steps in order to ultimately get to a chair for dialysis.  Discussed with patient on meeting [place] halfway in order to continue increasing her mobility so that she can discharge home.  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  Active Problems:   Closed intertrochanteric fracture of hip, left, sequela   ESRD (end stage renal disease) on dialysis ([facility])   Uncontrolled type 2 diabetes mellitus with hypoglycemia, with long-term current use of insulin (HCC)   History of CVA (cerebrovascular accident)   Benign essential HTN   Dyslipidemia,  goal LDL below 100   Anxiety and depression   GASTROESOPHAGEAL REFLUX, NO ESOPHAGITIS   TOBACCO DEPENDENCE   Wound infection   Iron deficiency anemia, unspecified   Wound dehiscence, surgical, sequela   Left femoral shaft fracture (HCC)   Subcutaneous osteomyelitis of the left femur with abscess, infected left hip hardware status post removal, left intertrochanteric fracture -Wound VAC to the left outer thigh -Infectious disease recommending daptomycin for 6 weeks with dialysis, stop date 8/12.  Follow-up with Dr. Candiss Norse 7/20  ESRD -Dialysis MWF.  Patient needs to be able to sit in chair to tolerate outpatient dialysis for discharge -Nephrology following  Anemia of chronic disease, component of acute blood loss anemia from wound -Status post 2 unit packed red blood cells 6/29 -Hemoglobin stable   Uncontrolled type 2 diabetes with hypoglycemia -A1c 7.1 -Semglee, NovoLog, sliding scale  Hypertension -Amlodipine, Coreg, hydralazine  Hyperlipidemia -Lipitor  DVT prophylaxis:  Place TED hose Start: 09/06/21 1507 SCDs Start: 08/31/21 1058 SCDs Start: 08/28/21 1049 heparin injection 5,000 Units Start: 08/26/21 1615  Code Status: Full code Family Communication: No family at bedside Disposition Plan:  Status is: Inpatient Remains inpatient appropriate because: Patient needs to continue to work on sitting in chair for dialysis.  She is declining SNF placement.   Antimicrobials:  Anti-infectives (From admission, onward)    Start     Dose/Rate Route Frequency Ordered Stop   09/02/21 2000  DAPTOmycin (CUBICIN) 900 mg in sodium chloride 0.9 % IVPB        10 mg/kg  92.4 kg 136 mL/hr over 30 Minutes Intravenous Once per day on Mon Wed Fri  09/02/21 0836 10/12/21 2359   08/31/21 0745  ceFAZolin (ANCEF) IVPB 2g/100 mL premix        2 g 200 mL/hr over 30 Minutes Intravenous To Short Stay 08/31/21 0730 08/31/21 0918   08/30/21 0600  ceFAZolin (ANCEF) IVPB 2g/100 mL premix  Status:   Discontinued        2 g 200 mL/hr over 30 Minutes Intravenous To Short Stay 08/30/21 0123 08/31/21 0600   08/29/21 2000  DAPTOmycin (CUBICIN) 900 mg in sodium chloride 0.9 % IVPB  Status:  Discontinued        10 mg/kg  92.4 kg 136 mL/hr over 30 Minutes Intravenous Every 48 hours 08/29/21 1627 09/02/21 0836   08/28/21 1800  ceFAZolin (ANCEF) IVPB 2g/100 mL premix  Status:  Discontinued        2 g 200 mL/hr over 30 Minutes Intravenous Every M-W-F (1800) 08/27/21 0846 08/29/21 1620   08/28/21 1200  ceFAZolin (ANCEF) IVPB 1 g/50 mL premix  Status:  Discontinued        1 g 100 mL/hr over 30 Minutes Intravenous Every M-W-F (Hemodialysis) 08/26/21 1954 08/27/21 0846   08/28/21 0600  ceFAZolin (ANCEF) IVPB 2g/100 mL premix        2 g 200 mL/hr over 30 Minutes Intravenous On call to O.R. 08/27/21 1150 08/28/21 0909   08/27/21 1800  ceFAZolin (ANCEF) IVPB 1 g/50 mL premix        1 g 100 mL/hr over 30 Minutes Intravenous  Once 08/26/21 1954 08/27/21 2048   08/26/21 1500  ceFEPIme (MAXIPIME) 1 g in sodium chloride 0.9 % 100 mL IVPB  Status:  Discontinued        1 g 200 mL/hr over 30 Minutes Intravenous Every 24 hours 08/26/21 1452 08/26/21 1714        Objective: Vitals:   09/11/21 1717 09/11/21 2126 09/12/21 0550 09/12/21 0902  BP: (!) 148/64 (!) 129/55 (!) 140/57 (!) 144/65  Pulse: 69 68 67 72  Resp: 18 18 16 18   Temp: 97.8 F (36.6 C) 98.5 F (36.9 C) 98.1 F (36.7 C) 98.5 F (36.9 C)  TempSrc: Oral Oral Oral   SpO2: 99% 98% 94% 100%  Weight:      Height:        Intake/Output Summary (Last 24 hours) at 09/12/2021 1307 Last data filed at 09/12/2021 0900 Gross per 24 hour  Intake 1211 ml  Output 1100 ml  Net 111 ml    Filed Weights   09/09/21 1859 09/11/21 0830 09/11/21 1300  Weight: 98.3 kg 99.2 kg 96 kg    Examination:  General exam: Appears calm and comfortable  Respiratory system: Clear to auscultation. Respiratory effort normal. No respiratory distress. No  conversational dyspnea.  Cardiovascular system: S1 & S2 heard, RRR. No murmurs. TED hose on right lower extremity Gastrointestinal system: Abdomen is nondistended, soft and nontender. Normal bowel sounds heard. Central nervous system: Alert and oriented. No focal neurological deficits. Speech clear.  Extremities: Left lower extremity status post BKA, wound VAC in place Psychiatry: Judgement and insight appear normal. Mood & affect appropriate.   Data Reviewed: I have personally reviewed following labs and imaging studies  CBC: Recent Labs  Lab 09/06/21 0932 09/11/21 0323  WBC 8.4 9.3  HGB 7.6* 8.0*  HCT 24.2* 24.7*  MCV 96.8 94.3  PLT 224 314    Basic Metabolic Panel: Recent Labs  Lab 09/06/21 0549 09/07/21 1257 09/08/21 0257 09/09/21 0036 09/10/21 0334 09/11/21 0323  NA 126* 127* 124*  123* 128* 124*  K 5.6* 4.6 4.7 4.9 4.0 4.5  CL 89* 89* 87* 86* 91* 91*  CO2 26 28 26 27 28 25   GLUCOSE 155* 143* 73 152* 165* 160*  BUN 46* 35* 42* 50* 30* 39*  CREATININE 5.50* 4.69* 5.20* 6.04* 4.03* 4.74*  CALCIUM 8.6* 8.1* 8.1* 8.3* 8.1* 8.2*  PHOS 4.4  --   --   --   --  4.4    GFR: Estimated Creatinine Clearance: 17.3 mL/min (A) (by C-G formula based on SCr of 4.74 mg/dL (H)). Liver Function Tests: Recent Labs  Lab 09/06/21 0549 09/11/21 0323  ALBUMIN 2.2* 2.4*    No results for input(s): "LIPASE", "AMYLASE" in the last 168 hours. No results for input(s): "AMMONIA" in the last 168 hours. Coagulation Profile: No results for input(s): "INR", "PROTIME" in the last 168 hours. Cardiac Enzymes: Recent Labs  Lab 09/06/21 0549  CKTOTAL 23*    BNP (last 3 results) No results for input(s): "PROBNP" in the last 8760 hours. HbA1C: No results for input(s): "HGBA1C" in the last 72 hours. CBG: Recent Labs  Lab 09/11/21 1352 09/11/21 1603 09/11/21 2126 09/12/21 0742 09/12/21 1146  GLUCAP 146* 194* 110* 112* 129*    Lipid Profile: No results for input(s): "CHOL",  "HDL", "LDLCALC", "TRIG", "CHOLHDL", "LDLDIRECT" in the last 72 hours. Thyroid Function Tests: No results for input(s): "TSH", "T4TOTAL", "FREET4", "T3FREE", "THYROIDAB" in the last 72 hours. Anemia Panel: No results for input(s): "VITAMINB12", "FOLATE", "FERRITIN", "TIBC", "IRON", "RETICCTPCT" in the last 72 hours. Sepsis Labs: No results for input(s): "PROCALCITON", "LATICACIDVEN" in the last 168 hours.  No results found for this or any previous visit (from the past 240 hour(s)).    Radiology Studies: No results found.    Scheduled Meds:  alosetron  1 mg Oral BID   amitriptyline  25 mg Oral QHS   amLODipine  5 mg Oral Daily   atorvastatin  40 mg Oral q1800   carvedilol  6.25 mg Oral BID   Chlorhexidine Gluconate Cloth  6 each Topical Q0600   cyclobenzaprine  10 mg Oral TID   [START ON 09/13/2021] darbepoetin (ARANESP) injection - DIALYSIS  200 mcg Intravenous Q Fri-HD   docusate sodium  100 mg Oral BID   DULoxetine  60 mg Oral Daily   heparin  5,000 Units Subcutaneous Q8H   hydrALAZINE  50 mg Oral TID   insulin aspart  0-6 Units Subcutaneous TID WC   insulin aspart  5 Units Subcutaneous TID WC   insulin glargine-yfgn  12 Units Subcutaneous QHS   multivitamin  1 tablet Oral QHS   pantoprazole  80 mg Oral Daily   sevelamer carbonate  1,600 mg Oral TID WC   sodium chloride flush  3 mL Intravenous Q12H   Continuous Infusions:  sodium chloride     sodium chloride Stopped (08/29/21 0139)   sodium chloride     DAPTOmycin (CUBICIN) 900 mg in sodium chloride 0.9 % IVPB 900 mg (09/11/21 2224)   methocarbamol (ROBAXIN) IV       LOS: 17 days     Dessa Phi, DO [facility] 09/12/2021, 1:07 PM   Available via Epic secure chat 7am-7pm After these hours, please refer to coverage provider listed on [email or web address]

## 2021-09-12 NOTE — Progress Notes (Signed)
Occupational Therapy Treatment Patient Details Name: [name] MRN: [id number] DOB: Oct 07, 1965 Today's Date: 09/12/2021   History of present illness 56 y.o. female presented to ED 6/26 from dialysis with increased bloody drainage from L hip wound. 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 s/p hardware removal of left hip with partial resection of tuft tissue and femure that were nonviable on 08/10/21 Discharged home 6/23. underwent L hip debridement and wound vac placement 6/28; +Left intertrochanteric non-union,  Fracture of left distal femur  PMH: hypertension, hyperlipidemia, ESRD on HD MWF, history of left BKA in 2021, depression/anxiety, stroke, tobacco use, T2DM,  insomnia, chronic pain syndrome,   OT comments  Patient received in supine and agreeable to OT/PT session. Patient asked to perform exercises before attempting standing from EOB. Patient was led in BUE strengthening exercises with yellow therapy band. Patient performed 3 stands from EOB to RW with max to mod/max assist of 2 with patient tolerating up to 50 seconds of standing on 3rd stand. Patient declined getting into recliner. Patient currently refusing SNF. Acute OT to continue to follow.    Recommendations for follow up therapy are one component of a multi-disciplinary discharge planning process, led by the attending physician.  Recommendations may be updated based on patient status, additional functional criteria and insurance authorization.    Follow Up Recommendations  Skilled nursing-short term rehab (<3 hours/day)    Assistance Recommended at Discharge Intermittent Supervision/Assistance  Patient can return home with the following  A lot of help with bathing/dressing/bathroom;Assistance with cooking/housework;Assist for transportation;Help with stairs or ramp for entrance;Two people to help with walking and/or transfers   Equipment Recommendations  None recommended by OT (defer)     Recommendations for Other Services      Precautions / Restrictions Precautions Precautions: Fall;Other (comment) Precaution Comments: L BKA (baseline), L LE wound vac Restrictions Weight Bearing Restrictions: Yes LLE Weight Bearing: Non weight bearing Other Position/Activity Restrictions: L distal femur fx--non union       Mobility Bed Mobility Overal bed mobility: Needs Assistance Bed Mobility: Sidelying to Sit, Sit to Sidelying   Sidelying to sit: Supervision     Sit to sidelying: Min assist General bed mobility comments: increased time    Transfers Overall transfer level: Needs assistance Equipment used: Rolling walker (2 wheels) Transfers: Sit to/from Stand Sit to Stand: Max assist, +2 physical assistance, Mod assist           General transfer comment: 3 stands performed from EOB with max to mod/max assist +2 to stand with RW.     Balance Overall balance assessment: Needs assistance Sitting-balance support: Feet supported Sitting balance-Leahy Scale: Good     Standing balance support: Bilateral upper extremity supported, During functional activity, Reliant on assistive device for balance Standing balance-Leahy Scale: Poor Standing balance comment: reliant on external support rolerated up to 50 seconds of standing for third stand                           ADL either performed or assessed with clinical judgement   ADL                                              Extremity/Trunk Assessment              Vision  Perception     Praxis      Cognition Arousal/Alertness: Awake/alert Behavior During Therapy: WFL for tasks assessed/performed Overall Cognitive Status: Within Functional Limits for tasks assessed Area of Impairment: Safety/judgement                         Safety/Judgement: Decreased awareness of safety, Decreased awareness of deficits     General Comments: more motivated to attempt  standing at bedside        Exercises Exercises: General Upper Extremity General Exercises - Upper Extremity Shoulder Flexion: Strengthening, Both, 10 reps, Supine, Theraband Theraband Level (Shoulder Flexion): Level 1 (Yellow) Shoulder ABduction: Strengthening, Both, 15 reps, Supine, Theraband Theraband Level (Shoulder Abduction): Level 1 (Yellow) Elbow Flexion: Strengthening, Both, 10 reps, Supine, Theraband Theraband Level (Elbow Flexion): Level 1 (Yellow) Elbow Extension: Strengthening, Both, 10 reps, Supine, Theraband Theraband Level (Elbow Extension): Level 1 (Yellow)    Shoulder Instructions       General Comments      Pertinent Vitals/ Pain       Pain Assessment Pain Assessment: 0-10 Pain Score: 7  Pain Location: R knee with attempts to stand Pain Descriptors / Indicators: Discomfort, Sore Pain Intervention(s): Limited activity within patient's tolerance, Monitored during session, Repositioned  Home Living                                          Prior Functioning/Environment              Frequency  Min 2X/week        Progress Toward Goals  OT Goals(current goals can now be found in the care plan section)  Progress towards OT goals: Progressing toward goals  Acute Rehab OT Goals Patient Stated Goal: get stronger OT Goal Formulation: With patient Time For Goal Achievement: 09/15/21 Potential to Achieve Goals: Fair ADL Goals Pt Will Perform Grooming: with modified independence;sitting Pt Will Perform Lower Body Bathing: with min assist;sitting/lateral leans Pt Will Perform Lower Body Dressing: with min assist;sitting/lateral leans Pt Will Transfer to Toilet: bedside commode;with transfer board;with supervision Pt Will Perform Toileting - Clothing Manipulation and hygiene: with min assist;sitting/lateral leans Pt/caregiver will Perform Home Exercise Program: Increased strength;Both right and left upper extremity;With  theraband;Independently;With written HEP provided Additional ADL Goal #1: Pt will perform bed mobility modified independently in preparation for ADLs with HOB flat.  Plan Discharge plan remains appropriate;Frequency remains appropriate    Co-evaluation    PT/OT/SLP Co-Evaluation/Treatment: Yes Reason for Co-Treatment: Complexity of the patient's impairments (multi-system involvement);For patient/therapist safety;To address functional/ADL transfers   OT goals addressed during session: Strengthening/ROM      AM-PAC OT "6 Clicks" Daily Activity     Outcome Measure   Help from another person eating meals?: None Help from another person taking care of personal grooming?: A Little Help from another person toileting, which includes using toliet, bedpan, or urinal?: Total Help from another person bathing (including washing, rinsing, drying)?: A Lot Help from another person to put on and taking off regular upper body clothing?: A Little Help from another person to put on and taking off regular lower body clothing?: A Lot 6 Click Score: 15    End of Session Equipment Utilized During Treatment: Gait belt;Rolling walker (2 wheels)  OT Visit Diagnosis: Muscle weakness (generalized) (M62.81);Unsteadiness on feet (R26.81);History of falling (Z91.81)   Activity Tolerance Patient tolerated treatment  well;Patient limited by pain   Patient Left in bed;with call bell/phone within reach;with bed alarm set   Nurse Communication Mobility status        Time: 1255-1341 OT Time Calculation (min): 46 min  Charges: OT General Charges $OT Visit: 1 Visit OT Treatments $Therapeutic Activity: 8-22 mins $Therapeutic Exercise: 8-22 mins  [name], OTA Acute Rehabilitation Services  Office [place] 09/12/2021, 1:54 PM

## 2021-09-13 DIAGNOSIS — M86252 Subacute osteomyelitis, left femur: Secondary | ICD-10-CM | POA: Diagnosis not present

## 2021-09-13 LAB — CBC
HCT: 26 % — ABNORMAL LOW (ref 36.0–46.0)
Hemoglobin: 8.4 g/dL — ABNORMAL LOW (ref 12.0–15.0)
MCH: 30.1 pg (ref 26.0–34.0)
MCHC: 32.3 g/dL (ref 30.0–36.0)
MCV: 93.2 fL (ref 80.0–100.0)
Platelets: 241 10*3/uL (ref 150–400)
RBC: 2.79 MIL/uL — ABNORMAL LOW (ref 3.87–5.11)
RDW: 15.8 % — ABNORMAL HIGH (ref 11.5–15.5)
WBC: 8.1 10*3/uL (ref 4.0–10.5)
nRBC: 0 % (ref 0.0–0.2)

## 2021-09-13 LAB — BASIC METABOLIC PANEL
Anion gap: 12 (ref 5–15)
BUN: 37 mg/dL — ABNORMAL HIGH (ref 6–20)
CO2: 23 mmol/L (ref 22–32)
Calcium: 8.3 mg/dL — ABNORMAL LOW (ref 8.9–10.3)
Chloride: 90 mmol/L — ABNORMAL LOW (ref 98–111)
Creatinine, Ser: 4.46 mg/dL — ABNORMAL HIGH (ref 0.44–1.00)
GFR, Estimated: 11 mL/min — ABNORMAL LOW (ref >60)
Glucose, Bld: 118 mg/dL — ABNORMAL HIGH (ref 70–99)
Potassium: 4.7 mmol/L (ref 3.5–5.1)
Sodium: 125 mmol/L — ABNORMAL LOW (ref 135–145)

## 2021-09-13 LAB — GLUCOSE, CAPILLARY
Glucose-Capillary: 129 mg/dL — ABNORMAL HIGH (ref 70–99)
Glucose-Capillary: 119 mg/dL — ABNORMAL HIGH (ref 70–99)
Glucose-Capillary: 177 mg/dL — ABNORMAL HIGH (ref 70–99)

## 2021-09-13 LAB — CK: Total CK: 39 U/L (ref 38–234)

## 2021-09-13 LAB — PHOSPHORUS: Phosphorus: 5.3 mg/dL — ABNORMAL HIGH (ref 2.5–4.6)

## 2021-09-13 MED ORDER — HEPARIN SODIUM (PORCINE) 1000 UNIT/ML DIALYSIS
4000.0000 [IU] | INTRAMUSCULAR | Status: DC | PRN
  Administered 2021-09-13: 4000 [IU] via INTRAVENOUS_CENTRAL
  Filled 2021-09-13: qty 4

## 2021-09-13 MED ORDER — LIDOCAINE-PRILOCAINE 2.5-2.5 % EX CREA: 1.0000 | TOPICAL_CREAM | CUTANEOUS | Status: DC | PRN

## 2021-09-13 MED ORDER — PENTAFLUOROPROP-TETRAFLUOROETH EX AERO: 1.0000 | INHALATION_SPRAY | CUTANEOUS | Status: DC | PRN

## 2021-09-13 MED ORDER — CHLORHEXIDINE GLUCONATE CLOTH 2 % EX PADS: 6.0000 | MEDICATED_PAD | Freq: Every day | CUTANEOUS | Status: DC

## 2021-09-13 MED ORDER — PROCHLORPERAZINE EDISYLATE 10 MG/2ML IJ SOLN
10.0000 mg | Freq: Four times a day (QID) | INTRAMUSCULAR | Status: DC | PRN
  Administered 2021-09-13 – 2021-11-12 (×7): 10 mg via INTRAVENOUS
  Filled 2021-09-13 (×11): qty 2

## 2021-09-13 MED ORDER — LIDOCAINE HCL (PF) 1 % IJ SOLN: 5.0000 mL | INTRAMUSCULAR | Status: DC | PRN

## 2021-09-13 NOTE — Progress Notes (Signed)
PROGRESS NOTE    [name]  ALP:[id number] DOB: Jan 10, 1966 DOA: 08/26/2021 PCP: [name], MD     Brief Narrative:  [name] is a 56 year old female with end-stage renal disease on hemodialysis, hypertension, hyperlipidemia, left BKA, history of CVA, left SFA revascularization, depression and anxiety, type 2 diabetes mellitus, tobacco use, chronic pain syndrome, left femur fracture after mechanical fall status post IM nail in 10/22 followed by left hip osteomyelitis and soft tissue abscesses status post multiple surgeries including removal of hardware, I&D and partial resection of the femur, receiving cefazolin on HD who presented to the hospital on 6/26 with bloody drainage from the surgical incision site. CT scan revealed ununited left inter-trochanteric fracture, soft tissue wound overlying the greater trochanter with air extending from the skin surface to the joint space concerning for infection. Dr. [name] was consulted and the patient underwent an I&D on 6/28 and again on 7/1. Cultures have grown out VRE and the patient is receiving daptomycin per ID. Unable to dc as she declines to sit up in a chair despite numerous encouragement and needs to be able to do so for outpt dialysis  New events last 24 hours / Subjective: Had some nausea this morning.  Unrelieved with Zofran.  I did order Compazine.  Was able to work with physical therapy yesterday to get to edge of bed and to stand.  Does not feel she is ready to be in a chair.  She states her main roadblock is lower extremity and dependent edema as well as pain on the left hip.  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  Active Problems:   Closed intertrochanteric fracture of hip, left, sequela   ESRD (end stage renal disease) on dialysis ([place])   Uncontrolled type 2 diabetes mellitus with hypoglycemia, with long-term current use of insulin (HCC)   History of CVA (cerebrovascular  accident)   Benign essential HTN   Dyslipidemia, goal LDL below 100   Anxiety and depression   GASTROESOPHAGEAL REFLUX, NO ESOPHAGITIS   TOBACCO DEPENDENCE   Wound infection   Iron deficiency anemia, unspecified   Wound dehiscence, surgical, sequela   Left femoral shaft fracture (HCC)   Subcutaneous osteomyelitis of the left femur with abscess, infected left hip hardware status post removal, left intertrochanteric fracture -Wound VAC to the left outer thigh -Infectious disease recommending daptomycin for 6 weeks with dialysis, stop date 8/12.  Follow-up with Dr. Candiss Norse 7/20  ESRD -Dialysis MWF.  Patient needs to be able to sit in chair to tolerate outpatient dialysis for discharge -Nephrology following  Anemia of chronic disease, component of acute blood loss anemia from wound -Status post 2 unit packed red blood cells 6/29 -Hemoglobin stable   Uncontrolled type 2 diabetes with hypoglycemia -A1c 7.1 -Semglee, NovoLog, sliding scale  Hypertension -Amlodipine, Coreg, hydralazine  Hyperlipidemia -Lipitor  DVT prophylaxis:  Place TED hose Start: 09/06/21 1507 SCDs Start: 08/31/21 1058 SCDs Start: 08/28/21 1049 heparin injection 5,000 Units Start: 08/26/21 1615  Code Status: Full code Family Communication: No family at bedside Disposition Plan:  Status is: Inpatient Remains inpatient appropriate because: Patient needs to continue to work on sitting in chair for dialysis.  She is declining SNF placement.   Antimicrobials:  Anti-infectives (From admission, onward)    Start     Dose/Rate Route Frequency Ordered Stop   09/02/21 2000  DAPTOmycin (CUBICIN) 900 mg in sodium chloride 0.9 % IVPB  10 mg/kg  92.4 kg 136 mL/hr over 30 Minutes Intravenous Once per day on Mon Wed Fri 09/02/21 0836 10/12/21 2359   08/31/21 0745  ceFAZolin (ANCEF) IVPB 2g/100 mL premix        2 g 200 mL/hr over 30 Minutes Intravenous To Short Stay 08/31/21 0730 08/31/21 0918   08/30/21 0600   ceFAZolin (ANCEF) IVPB 2g/100 mL premix  Status:  Discontinued        2 g 200 mL/hr over 30 Minutes Intravenous To Short Stay 08/30/21 0123 08/31/21 0600   08/29/21 2000  DAPTOmycin (CUBICIN) 900 mg in sodium chloride 0.9 % IVPB  Status:  Discontinued        10 mg/kg  92.4 kg 136 mL/hr over 30 Minutes Intravenous Every 48 hours 08/29/21 1627 09/02/21 0836   08/28/21 1800  ceFAZolin (ANCEF) IVPB 2g/100 mL premix  Status:  Discontinued        2 g 200 mL/hr over 30 Minutes Intravenous Every M-W-F (1800) 08/27/21 0846 08/29/21 1620   08/28/21 1200  ceFAZolin (ANCEF) IVPB 1 g/50 mL premix  Status:  Discontinued        1 g 100 mL/hr over 30 Minutes Intravenous Every M-W-F (Hemodialysis) 08/26/21 1954 08/27/21 0846   08/28/21 0600  ceFAZolin (ANCEF) IVPB 2g/100 mL premix        2 g 200 mL/hr over 30 Minutes Intravenous On call to O.R. 08/27/21 1150 08/28/21 0909   08/27/21 1800  ceFAZolin (ANCEF) IVPB 1 g/50 mL premix        1 g 100 mL/hr over 30 Minutes Intravenous  Once 08/26/21 1954 08/27/21 2048   08/26/21 1500  ceFEPIme (MAXIPIME) 1 g in sodium chloride 0.9 % 100 mL IVPB  Status:  Discontinued        1 g 200 mL/hr over 30 Minutes Intravenous Every 24 hours 08/26/21 1452 08/26/21 1714        Objective: Vitals:   09/12/21 1652 09/12/21 2144 09/13/21 0617 09/13/21 0916  BP: (!) 142/57 (!) 134/57 (!) 156/63 (!) 157/85  Pulse: 64 66 71 79  Resp: 16 18 18 18   Temp: 98 F (36.7 C) 99 F (37.2 C) 98.6 F (37 C) 98.7 F (37.1 C)  TempSrc:  Oral Oral Oral  SpO2: 100% 100% 100% 100%  Weight:      Height:        Intake/Output Summary (Last 24 hours) at 09/13/2021 1211 Last data filed at 09/13/2021 1017 Gross per 24 hour  Intake 1117 ml  Output 800 ml  Net 317 ml    Filed Weights   09/09/21 1859 09/11/21 0830 09/11/21 1300  Weight: 98.3 kg 99.2 kg 96 kg    Examination:  General exam: Appears calm and comfortable  Respiratory system: Clear to auscultation. Respiratory effort  normal. No respiratory distress. No conversational dyspnea.  Cardiovascular system: S1 & S2 heard, RRR. No murmurs. TED hose on right lower extremity Gastrointestinal system: Abdomen is nondistended, soft and nontender. Normal bowel sounds heard. Central nervous system: Alert and oriented. No focal neurological deficits. Speech clear.  Extremities: Left lower extremity status post BKA, wound VAC in place Psychiatry: Judgement and insight appear normal. Mood & affect appropriate.   Data Reviewed: I have personally reviewed following labs and imaging studies  CBC: Recent Labs  Lab 09/11/21 0323 09/13/21 0549  WBC 9.3 8.1  HGB 8.0* 8.4*  HCT 24.7* 26.0*  MCV 94.3 93.2  PLT 225 973    Basic Metabolic Panel: Recent Labs  Lab 09/08/21 0257 09/09/21 0036 09/10/21 0334 09/11/21 0323 09/13/21 0549  NA 124* 123* 128* 124* 125*  K 4.7 4.9 4.0 4.5 4.7  CL 87* 86* 91* 91* 90*  CO2 26 27 28 25 23   GLUCOSE 73 152* 165* 160* 118*  BUN 42* 50* 30* 39* 37*  CREATININE 5.20* 6.04* 4.03* 4.74* 4.46*  CALCIUM 8.1* 8.3* 8.1* 8.2* 8.3*  PHOS  --   --   --  4.4 5.3*    GFR: Estimated Creatinine Clearance: 18.4 mL/min (A) (by C-G formula based on SCr of 4.46 mg/dL (H)). Liver Function Tests: Recent Labs  Lab 09/11/21 0323  ALBUMIN 2.4*    No results for input(s): "LIPASE", "AMYLASE" in the last 168 hours. No results for input(s): "AMMONIA" in the last 168 hours. Coagulation Profile: No results for input(s): "INR", "PROTIME" in the last 168 hours. Cardiac Enzymes: Recent Labs  Lab 09/13/21 0549  CKTOTAL 39    BNP (last 3 results) No results for input(s): "PROBNP" in the last 8760 hours. HbA1C: No results for input(s): "HGBA1C" in the last 72 hours. CBG: Recent Labs  Lab 09/12/21 1146 09/12/21 1650 09/12/21 2142 09/13/21 0728 09/13/21 1146  GLUCAP 129* 142* 156* 129* 119*    Lipid Profile: No results for input(s): "CHOL", "HDL", "LDLCALC", "TRIG", "CHOLHDL",  "LDLDIRECT" in the last 72 hours. Thyroid Function Tests: No results for input(s): "TSH", "T4TOTAL", "FREET4", "T3FREE", "THYROIDAB" in the last 72 hours. Anemia Panel: No results for input(s): "VITAMINB12", "FOLATE", "FERRITIN", "TIBC", "IRON", "RETICCTPCT" in the last 72 hours. Sepsis Labs: No results for input(s): "PROCALCITON", "LATICACIDVEN" in the last 168 hours.  No results found for this or any previous visit (from the past 240 hour(s)).    Radiology Studies: No results found.    Scheduled Meds:  alosetron  1 mg Oral BID   amitriptyline  25 mg Oral QHS   amLODipine  5 mg Oral Daily   atorvastatin  40 mg Oral q1800   carvedilol  6.25 mg Oral BID   Chlorhexidine Gluconate Cloth  6 each Topical Q0600   cyclobenzaprine  10 mg Oral TID   darbepoetin (ARANESP) injection - DIALYSIS  200 mcg Intravenous Q Fri-HD   docusate sodium  100 mg Oral BID   DULoxetine  60 mg Oral Daily   heparin  5,000 Units Subcutaneous Q8H   hydrALAZINE  50 mg Oral TID   insulin aspart  0-6 Units Subcutaneous TID WC   insulin aspart  5 Units Subcutaneous TID WC   insulin glargine-yfgn  12 Units Subcutaneous QHS   multivitamin  1 tablet Oral QHS   pantoprazole  80 mg Oral Daily   sevelamer carbonate  1,600 mg Oral TID WC   sodium chloride flush  3 mL Intravenous Q12H   Continuous Infusions:  sodium chloride     sodium chloride Stopped (08/29/21 0139)   sodium chloride     DAPTOmycin (CUBICIN) 900 mg in sodium chloride 0.9 % IVPB 900 mg (09/11/21 2224)   methocarbamol (ROBAXIN) IV       LOS: 18 days     Dessa Phi, DO [facility] 09/13/2021, 12:11 PM   Available via Epic secure chat 7am-7pm After these hours, please refer to coverage provider listed on [email or web address]

## 2021-09-13 NOTE — Progress Notes (Signed)
Received patient in bed, alert and oriented. Informed consent signed and in chart.  Time tx completed: 1830  HD treatment completed. Patient tolerated well. Fistula/Graft/HD catheter without signs and symptoms of complications. Patient transported back to the room, alert and orient and in no acute distress. Report given to bedside RN.  Total UF removed: 4L  Medication given: none  Post HD VS: BP- 124/58, HR- 73, RR-15, SP02-100, Temp-98.1  Post HD weight: 95.7kg

## 2021-09-13 NOTE — TOC Progression Note (Signed)
Transition of Care [facility]) - Initial/Assessment Note    Patient Details  Name: [name] MRN: [id number] Date of Birth: 1965-11-17  Transition of Care [facility]) CM/SW Contact:    Milinda Antis, [place] Phone Number: 09/13/2021, 10:19 AM  Clinical Narrative:                 Patient still receiving dialysis in bed although encourage by staff to attempt to sit in a chair.  Patient will need to be able to sit for dialysis prior to discharge.  TOC will continue to follow.    Expected Discharge Plan: Skilled Nursing Facility Barriers to Discharge: Continued Medical Work up   Patient Goals and CMS Choice Patient states their goals for this hospitalization and ongoing recovery are:: To return home CMS Medicare.gov Compare Post Acute Care list provided to:: Patient Choice offered to / list presented to : NA  Expected Discharge Plan and Services Expected Discharge Plan: [place]   Discharge Planning Services: CM Consult   Living arrangements for the past 2 months: Single Family Home                                      Prior Living Arrangements/Services Living arrangements for the past 2 months: Single Family Home Lives with:: Adult Children Patient language and need for interpreter reviewed:: Yes Do you feel safe going back to the place where you live?: Yes      Need for Family Participation in Patient Care: Yes (Comment) Care giver support system in place?: Yes (comment) Current home services: DME ([employer], w/c) Criminal Activity/Legal Involvement Pertinent to Current Situation/Hospitalization: No - Comment as needed  Activities of Daily Living Home Assistive Devices/Equipment: Eyeglasses, Wheelchair ADL Screening (condition at time of admission) Patient's cognitive ability adequate to safely complete daily activities?: Yes Is the patient deaf or have difficulty hearing?: No Does the patient have difficulty seeing, even when wearing  glasses/contacts?: No Does the patient have difficulty concentrating, remembering, or making decisions?: No Patient able to express need for assistance with ADLs?: Yes Does the patient have difficulty dressing or bathing?: Yes Independently performs ADLs?: No Communication: Independent Dressing (OT): Independent Is this a change from baseline?: Pre-admission baseline Grooming: Independent Feeding: Independent Bathing: Needs assistance Is this a change from baseline?: Pre-admission baseline Toileting: Needs assistance Is this a change from baseline?: Pre-admission baseline In/Out Bed: Needs assistance, Dependent Is this a change from baseline?: Pre-admission baseline Walks in Home: Dependent Is this a change from baseline?: Pre-admission baseline Does the patient have difficulty walking or climbing stairs?: Yes Weakness of Legs: Both Weakness of Arms/Hands: Both  Permission Sought/Granted Permission sought to share information with : Case Manager, Family Supports Permission granted to share information with : Yes, Verbal Permission Granted              Emotional Assessment Appearance:: Appears stated age Attitude/Demeanor/Rapport: Engaged, Gracious Affect (typically observed): Accepting, Appropriate, Calm, Hopeful Orientation: : Oriented to Self, Oriented to Place, Oriented to  Time, Oriented to Situation Alcohol / Substance Use: Not Applicable Psych Involvement: No (comment)  Admission diagnosis:  Wound infection [T14.8XXA, L08.9] Abscess of left hip [L02.416] Patient Active Problem List   Diagnosis Date Noted   Left femoral shaft fracture ([place]) 09/05/2021   Wound dehiscence, surgical, sequela    Abscess of left hip 08/26/2021   History of CVA (cerebrovascular accident) 82/95/6213   Hardware complicating wound  infection ([place])    Subacute osteomyelitis of left femur with abscess     Septic arthritis of hip ([place]) 07/30/2021   Skin abscess L hip 07/28/2021   HCAP  (healthcare-associated pneumonia) 07/27/2021   Hyperkalemia 91/69/4503   Metabolic acidemia 88/82/8003   Closed fracture of left distal femur ([place]) 07/22/2021   Uncontrolled type 2 diabetes mellitus with hypoglycemia, with long-term current use of insulin ([place]) 07/22/2021   Closed intertrochanteric fracture of hip, left, sequela 07/21/2021   Infection of superficial incisional surgical site after procedure 03/09/2020   Iron deficiency anemia, unspecified 02/21/2020   Abscess    Hypoglycemia    Benign essential HTN    Sleep disturbance    ESRD (end stage renal disease) on dialysis ([place]) 01/24/2020   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[place]) 01/23/2020   Hyponatremia    Constipation    Chronic osteomyelitis involving left ankle and foot ([place])    Ulcer of left foot with necrosis of bone ([place])    Wound infection 12/28/2019   History of [place] amputation of left foot ([place]) 12/25/2017   Dyslipidemia, goal LDL below 100 07/04/2017   History of CVA (cerebrovascular accident) without residual deficits 07/04/2017   Cerebral thrombosis with cerebral infarction 04/08/2017   Right sided weakness 04/07/2017   Hyperhidrosis 09/01/2016   Migraine with aura and without status migrainosus, not intractable 07/28/2016   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[place]) 08/04/2014   CKD stage 3 due to type 2 diabetes mellitus ([place]) 06/27/2014   Vitamin D insufficiency 05/08/2014   Obesity (BMI 30.0-34.9) 05/08/2014   Unilateral amputation of left foot ([place]) 05/08/2014   Bursitis of left shoulder 02/14/2014   Neck pain 01/03/2014   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.23) 01/14/2013   Insomnia 08/04/2012   Diabetic neuropathy, painful ([place]) 08/04/2011   Anxiety and depression 05/16/2010   Female stress incontinence 11/01/2007   TOBACCO DEPENDENCE 04/30/2006   GASTROESOPHAGEAL REFLUX, NO ESOPHAGITIS 04/30/2006   Irritable bowel syndrome 04/30/2006    PCP:  [name], MD Pharmacy:   [facility], [place] [address] [place] [address] Phone: [phone] Fax: [phone]  [id number] - [place], [place] [address] [place] [address] Phone: [phone] Fax: [phone]     Social Determinants of Health (SDOH) Interventions    Readmission Risk Interventions    07/26/2021    3:15 PM  Readmission Risk Prevention Plan  Transportation Screening Complete  PCP or Specialist Appt within 3-5 Days Complete  HRI or Home Care Consult Complete  Palliative Care Screening Not Applicable  Medication Review (RN Care Manager) Referral to Pharmacy

## 2021-09-13 NOTE — Progress Notes (Signed)
Pharmacy Antibiotic Note  [name] is a 56 y.o. 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 Prior intra-op cultures in May grew MSSA, now with intra-op cultures this admission growing Enterococcus faecium (VRE). Pharmacy has been consulted for Daptomycin dosing.  Noted pt is ESRD and her daptomycin is given after dialysis on MWF.  CK remains within normal limits.  6/30 CK 21 7/7 CK 23 7/14 CK 39   Plan: - Continue Daptomycin 900 mg (10 mg/kg TBW) IV every MWF after HD, anticipated stop date 10/12/21 per ID - Continue with weekly CK monitoring on Fridays - Will continue to follow HD schedule/duration, LOT, and antibiotic de-escalation plans   Height: 5' 10.98" (180.3 cm) Weight: 96 kg (211 lb 10.3 oz) IBW/kg (Calculated) : 70.76  Temp (24hrs), Avg:98.6 F (37 C), Min:98 F (36.7 C), Max:99 F (37.2 C)  Recent Labs  Lab 09/06/21 0932 09/07/21 1257 09/08/21 0257 09/09/21 0036 09/10/21 0334 09/11/21 0323 09/13/21 0549  WBC 8.4  --   --   --   --  9.3 8.1  CREATININE  --    < > 5.20* 6.04* 4.03* 4.74* 4.46*   < > = values in this interval not displayed.     Estimated Creatinine Clearance: 18.4 mL/min (A) (by C-G formula based on SCr of 4.46 mg/dL (H)).    Allergies  Allergen Reactions   Trazodone Swelling   Latex Rash   Lidocaine Itching   Laurelle Skiver, Pharm.D., BCPS Clinical Pharmacist Clinical phone for 09/13/2021 from 7:30-3:00 is [phone].  **Pharmacist phone directory can be found on [email or web address] listed under [place].  09/13/2021 9:29 AM

## 2021-09-13 NOTE — Progress Notes (Addendum)
Subjective:   For HD today  ,not sure if she can sit  in Recliner for hd 2/2 nausea  Objective Vital signs in last 24 hours: Vitals:   09/12/21 1652 09/12/21 2144 09/13/21 0617 09/13/21 0916  BP: (!) 142/57 (!) 134/57 (!) 156/63 (!) 157/85  Pulse: 64 66 71 79  Resp: 16 18 18 18   Temp: 98 F (36.7 C) 99 F (37.2 C) 98.6 F (37 C) 98.7 F (37.1 C)  TempSrc:  Oral Oral Oral  SpO2: 100% 100% 100% 100%  Weight:      Height:       Weight change:   Physical Exam: General: obese Female chronically ill appearing  NAD  Heart: RRR no MRG Lungs: CTA  Abdomen: obese , NABS, Soft ,NT, ND, some abd wall edema  Extremities: R Thigh 1+ edema and compression hose, L BKA 2+  edema wound vac L hip  Dialysis Access: LA AVF + bruit     Dialysis Orders: [facility] MWF 4h  400/500  90kg  2/2 bath  P2  Heparin 4000 units IV TIW  LUA AVF  Problem/Plan:  L hip infection: S/p 08/10/21 surgery -> persistent infec VRE Cx > OR 08/28/2021 +08/31/21 for L hip debridement, wound vac .on Daptomycin/" ID recommends continuing Daptomycin 6 weeks on HD stop 10/12/21 // f/u with Dr. Candiss Norse on 7/20". ESRD - on HD MWF. Serial HD 06/28, 6/29 ,6/30. [place] Kitchen Vol . NA 125 = ^ Vol K 4.7. Follow trends. HD machine clotted off 7/12 near end of treatment./holding Heparin d/t current anemia. BUT  with Mach  Clotting off= use  Heparin  today / Access - Difficult cannulation on 7/07 . VVS consulted for duplex study but apparently patient refused. No issues with cannulation lately. Monitor access closely. HTN/ volume: BP high admit, improved some with UF vol. Weight up again. Net UF 3Llast hd . Bed wts. Not Accurate May need extra HD this week (Saturday 7/15).  On amlodipine 5mg  q d and carved. 6.25mg  bid   Anemia of ESRD -  am hgb 8.4 ,Hgb declined post op - 2U PRBCs given 6/29. Was  Holding heparin with HD.BUT with HD TXs  clotting =bld loss will try some using Hep.  Today .Aranesp 200 mcg ^ recently Monitor trend. Secondary HPTH: CCa in  range, PO4 at goal.  Cont binders.  DM2: Insulin per primary Dispo: Will need to dialyze in recliner prior to discharge. She has been counseled on this thoroughly  in order for her to continue HD in outpatient. Ernest Haber, PA-C [facility] [phone] 09/13/2021,11:50 AM  LOS: 18 days   Labs: Basic Metabolic Panel: Recent Labs  Lab 09/10/21 0334 09/11/21 0323 09/13/21 0549  NA 128* 124* 125*  K 4.0 4.5 4.7  CL 91* 91* 90*  CO2 28 25 23   GLUCOSE 165* 160* 118*  BUN 30* 39* 37*  CREATININE 4.03* 4.74* 4.46*  CALCIUM 8.1* 8.2* 8.3*  PHOS  --  4.4 5.3*   Liver Function Tests: Recent Labs  Lab 09/11/21 0323  ALBUMIN 2.4*   No results for input(s): "LIPASE", "AMYLASE" in the last 168 hours. No results for input(s): "AMMONIA" in the last 168 hours. CBC: Recent Labs  Lab 09/11/21 0323 09/13/21 0549  WBC 9.3 8.1  HGB 8.0* 8.4*  HCT 24.7* 26.0*  MCV 94.3 93.2  PLT 225 241   Cardiac Enzymes: Recent Labs  Lab 09/13/21 0549  CKTOTAL 39   CBG: Recent Labs  Lab  09/12/21 1146 09/12/21 1650 09/12/21 2142 09/13/21 0728 09/13/21 1146  GLUCAP 129* 142* 156* 129* 119*    Studies/Results: No results found. Medications:  sodium chloride     sodium chloride Stopped (08/29/21 0139)   sodium chloride     DAPTOmycin (CUBICIN) 900 mg in sodium chloride 0.9 % IVPB 900 mg (09/11/21 2224)   methocarbamol (ROBAXIN) IV      alosetron  1 mg Oral BID   amitriptyline  25 mg Oral QHS   amLODipine  5 mg Oral Daily   atorvastatin  40 mg Oral q1800   carvedilol  6.25 mg Oral BID   Chlorhexidine Gluconate Cloth  6 each Topical Q0600   cyclobenzaprine  10 mg Oral TID   darbepoetin (ARANESP) injection - DIALYSIS  200 mcg Intravenous Q Fri-HD   docusate sodium  100 mg Oral BID   DULoxetine  60 mg Oral Daily   heparin  5,000 Units Subcutaneous Q8H   hydrALAZINE  50 mg Oral TID   insulin aspart  0-6 Units Subcutaneous TID WC   insulin aspart  5 Units  Subcutaneous TID WC   insulin glargine-yfgn  12 Units Subcutaneous QHS   multivitamin  1 tablet Oral QHS   pantoprazole  80 mg Oral Daily   sevelamer carbonate  1,600 mg Oral TID WC   sodium chloride flush  3 mL Intravenous Q12H

## 2021-09-13 NOTE — Progress Notes (Signed)
at 1600 i notified for med so i could give the last hour of HD tx. i spoke w/ pharm and they said the med was "too expensive" to send to [place] and the patient could run it on the floor.  i explained our standing orders state we admin iv abx that are Q24 hrs. She still said we would not give this med b/c it was "too expensive". I will pass this on to management so that they can amend standing orders if need be.

## 2021-09-14 DIAGNOSIS — M86252 Subacute osteomyelitis, left femur: Secondary | ICD-10-CM | POA: Diagnosis not present

## 2021-09-14 LAB — GLUCOSE, CAPILLARY
Glucose-Capillary: 150 mg/dL — ABNORMAL HIGH (ref 70–99)
Glucose-Capillary: 170 mg/dL — ABNORMAL HIGH (ref 70–99)
Glucose-Capillary: 134 mg/dL — ABNORMAL HIGH (ref 70–99)
Glucose-Capillary: 167 mg/dL — ABNORMAL HIGH (ref 70–99)

## 2021-09-14 NOTE — Progress Notes (Signed)
PROGRESS NOTE    [name]  YWV:[id number] DOB: 28-Nov-1965 DOA: 08/26/2021 PCP: [name], MD     Brief Narrative:  [name] is a 56 year old female with end-stage renal disease on hemodialysis, hypertension, hyperlipidemia, left BKA, history of CVA, left SFA revascularization, depression and anxiety, type 2 diabetes mellitus, tobacco use, chronic pain syndrome, left femur fracture after mechanical fall status post IM nail in 10/22 followed by left hip osteomyelitis and soft tissue abscesses status post multiple surgeries including removal of hardware, I&D and partial resection of the femur, receiving cefazolin on HD who presented to the hospital on 6/26 with bloody drainage from the surgical incision site. CT scan revealed ununited left inter-trochanteric fracture, soft tissue wound overlying the greater trochanter with air extending from the skin surface to the joint space concerning for infection. Dr. [name] was consulted and the patient underwent an I&D on 6/28 and again on 7/1. Cultures have grown out VRE and the patient is receiving daptomycin per ID. Unable to dc as she declines to sit up in a chair despite numerous encouragement and needs to be able to do so for outpt dialysis  New events last 24 hours / Subjective: No new complaints today.  Compazine yesterday helped with her nausea.  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  Active Problems:   Closed intertrochanteric fracture of hip, left, sequela   ESRD (end stage renal disease) on dialysis ([facility])   Uncontrolled type 2 diabetes mellitus with hypoglycemia, with long-term current use of insulin (HCC)   History of CVA (cerebrovascular accident)   Benign essential HTN   Dyslipidemia, goal LDL below 100   Anxiety and depression   GASTROESOPHAGEAL REFLUX, NO ESOPHAGITIS   TOBACCO DEPENDENCE   Wound infection   Iron  deficiency anemia, unspecified   Wound dehiscence, surgical, sequela   Left femoral shaft fracture (HCC)   Subcutaneous osteomyelitis of the left femur with abscess, infected left hip hardware status post removal, left intertrochanteric fracture -Wound VAC to the left outer thigh -Infectious disease recommending daptomycin for 6 weeks with dialysis, stop date 8/12.  Follow-up with Dr. Candiss Norse 7/20  ESRD -Dialysis MWF.  Patient needs to be able to sit in chair to tolerate outpatient dialysis for discharge -Nephrology following  Anemia of chronic disease, component of acute blood loss anemia from wound -Status post 2 unit packed red blood cells 6/29 -Hemoglobin stable   Uncontrolled type 2 diabetes with hypoglycemia -A1c 7.1 -Semglee, NovoLog, sliding scale  Hypertension -Amlodipine, Coreg, hydralazine  Hyperlipidemia -Lipitor  DVT prophylaxis:  Place TED hose Start: 09/06/21 1507 SCDs Start: 08/31/21 1058 SCDs Start: 08/28/21 1049 heparin injection 5,000 Units Start: 08/26/21 1615  Code Status: Full code Family Communication: No family at bedside Disposition Plan:  Status is: Inpatient Remains inpatient appropriate because: Patient needs to continue to work on sitting in chair for dialysis.  She is declining SNF placement.   Antimicrobials:  Anti-infectives (From admission, onward)    Start     Dose/Rate Route Frequency Ordered Stop   09/02/21 2000  DAPTOmycin (CUBICIN) 900 mg in sodium chloride 0.9 % IVPB        10 mg/kg  92.4 kg 136 mL/hr over 30 Minutes Intravenous Once per day on Mon Wed Fri 09/02/21 0836 10/12/21 2359   08/31/21 0745  ceFAZolin (ANCEF) IVPB 2g/100 mL premix        2 g  200 mL/hr over 30 Minutes Intravenous To Short Stay 08/31/21 0730 08/31/21 0918   08/30/21 0600  ceFAZolin (ANCEF) IVPB 2g/100 mL premix  Status:  Discontinued        2 g 200 mL/hr over 30 Minutes Intravenous To Short Stay 08/30/21 0123 08/31/21 0600   08/29/21 2000  DAPTOmycin  (CUBICIN) 900 mg in sodium chloride 0.9 % IVPB  Status:  Discontinued        10 mg/kg  92.4 kg 136 mL/hr over 30 Minutes Intravenous Every 48 hours 08/29/21 1627 09/02/21 0836   08/28/21 1800  ceFAZolin (ANCEF) IVPB 2g/100 mL premix  Status:  Discontinued        2 g 200 mL/hr over 30 Minutes Intravenous Every M-W-F (1800) 08/27/21 0846 08/29/21 1620   08/28/21 1200  ceFAZolin (ANCEF) IVPB 1 g/50 mL premix  Status:  Discontinued        1 g 100 mL/hr over 30 Minutes Intravenous Every M-W-F (Hemodialysis) 08/26/21 1954 08/27/21 0846   08/28/21 0600  ceFAZolin (ANCEF) IVPB 2g/100 mL premix        2 g 200 mL/hr over 30 Minutes Intravenous On call to O.R. 08/27/21 1150 08/28/21 0909   08/27/21 1800  ceFAZolin (ANCEF) IVPB 1 g/50 mL premix        1 g 100 mL/hr over 30 Minutes Intravenous  Once 08/26/21 1954 08/27/21 2048   08/26/21 1500  ceFEPIme (MAXIPIME) 1 g in sodium chloride 0.9 % 100 mL IVPB  Status:  Discontinued        1 g 200 mL/hr over 30 Minutes Intravenous Every 24 hours 08/26/21 1452 08/26/21 1714        Objective: Vitals:   09/13/21 1838 09/13/21 2053 09/14/21 0555 09/14/21 0930  BP:  (!) 118/53 (!) 152/59 (!) 137/49  Pulse:  75 71 78  Resp:  18 18 17   Temp:  98.2 F (36.8 C) 98.3 F (36.8 C) 97.9 F (36.6 C)  TempSrc:   Oral   SpO2:  96% 98% 98%  Weight: 95.7 kg     Height:        Intake/Output Summary (Last 24 hours) at 09/14/2021 1249 Last data filed at 09/14/2021 0800 Gross per 24 hour  Intake 537 ml  Output 4125 ml  Net -3588 ml    Filed Weights   09/11/21 1300 09/13/21 1520 09/13/21 1838  Weight: 96 kg 103.4 kg 95.7 kg    Examination:  General exam: Appears calm and comfortable  Respiratory system: Clear to auscultation. Respiratory effort normal. No respiratory distress. No conversational dyspnea.  Cardiovascular system: S1 & S2 heard, RRR. No murmurs. TED hose on right lower extremity Gastrointestinal system: Abdomen is nondistended, soft and  nontender. Normal bowel sounds heard. Central nervous system: Alert and oriented. No focal neurological deficits. Speech clear.  Extremities: Left lower extremity status post BKA, wound VAC in place Psychiatry: Judgement and insight appear normal. Mood & affect appropriate.   Data Reviewed: I have personally reviewed following labs and imaging studies  CBC: Recent Labs  Lab 09/11/21 0323 09/13/21 0549  WBC 9.3 8.1  HGB 8.0* 8.4*  HCT 24.7* 26.0*  MCV 94.3 93.2  PLT 225 027    Basic Metabolic Panel: Recent Labs  Lab 09/08/21 0257 09/09/21 0036 09/10/21 0334 09/11/21 0323 09/13/21 0549  NA 124* 123* 128* 124* 125*  K 4.7 4.9 4.0 4.5 4.7  CL 87* 86* 91* 91* 90*  CO2 26 27 28 25 23   GLUCOSE 73 152* 165* 160*  118*  BUN 42* 50* 30* 39* 37*  CREATININE 5.20* 6.04* 4.03* 4.74* 4.46*  CALCIUM 8.1* 8.3* 8.1* 8.2* 8.3*  PHOS  --   --   --  4.4 5.3*    GFR: Estimated Creatinine Clearance: 18.4 mL/min (A) (by C-G formula based on SCr of 4.46 mg/dL (H)). Liver Function Tests: Recent Labs  Lab 09/11/21 0323  ALBUMIN 2.4*    No results for input(s): "LIPASE", "AMYLASE" in the last 168 hours. No results for input(s): "AMMONIA" in the last 168 hours. Coagulation Profile: No results for input(s): "INR", "PROTIME" in the last 168 hours. Cardiac Enzymes: Recent Labs  Lab 09/13/21 0549  CKTOTAL 39    BNP (last 3 results) No results for input(s): "PROBNP" in the last 8760 hours. HbA1C: No results for input(s): "HGBA1C" in the last 72 hours. CBG: Recent Labs  Lab 09/13/21 0728 09/13/21 1146 09/13/21 2050 09/14/21 0738 09/14/21 1127  GLUCAP 129* 119* 177* 150* 170*    Lipid Profile: No results for input(s): "CHOL", "HDL", "LDLCALC", "TRIG", "CHOLHDL", "LDLDIRECT" in the last 72 hours. Thyroid Function Tests: No results for input(s): "TSH", "T4TOTAL", "FREET4", "T3FREE", "THYROIDAB" in the last 72 hours. Anemia Panel: No results for input(s): "VITAMINB12",  "FOLATE", "FERRITIN", "TIBC", "IRON", "RETICCTPCT" in the last 72 hours. Sepsis Labs: No results for input(s): "PROCALCITON", "LATICACIDVEN" in the last 168 hours.  No results found for this or any previous visit (from the past 240 hour(s)).    Radiology Studies: No results found.    Scheduled Meds:  alosetron  1 mg Oral BID   amitriptyline  25 mg Oral QHS   amLODipine  5 mg Oral Daily   atorvastatin  40 mg Oral q1800   carvedilol  6.25 mg Oral BID   Chlorhexidine Gluconate Cloth  6 each Topical Q0600   cyclobenzaprine  10 mg Oral TID   darbepoetin (ARANESP) injection - DIALYSIS  200 mcg Intravenous Q Fri-HD   docusate sodium  100 mg Oral BID   DULoxetine  60 mg Oral Daily   heparin  5,000 Units Subcutaneous Q8H   hydrALAZINE  50 mg Oral TID   insulin aspart  0-6 Units Subcutaneous TID WC   insulin aspart  5 Units Subcutaneous TID WC   insulin glargine-yfgn  12 Units Subcutaneous QHS   multivitamin  1 tablet Oral QHS   pantoprazole  80 mg Oral Daily   sevelamer carbonate  1,600 mg Oral TID WC   sodium chloride flush  3 mL Intravenous Q12H   Continuous Infusions:  sodium chloride     sodium chloride Stopped (08/29/21 0139)   sodium chloride     DAPTOmycin (CUBICIN) 900 mg in sodium chloride 0.9 % IVPB 900 mg (09/13/21 2145)   methocarbamol (ROBAXIN) IV       LOS: 19 days     Dessa Phi, DO [facility] 09/14/2021, 12:49 PM   Available via Epic secure chat 7am-7pm After these hours, please refer to coverage provider listed on [email or web address]

## 2021-09-14 NOTE — Progress Notes (Signed)
Subjective: Seen in room, said tolerated 4 L UF dialysis yesterday.  Currently no complaints thinks peripheral edema is improving  Objective Vital signs in last 24 hours: Vitals:   09/13/21 1838 09/13/21 2053 09/14/21 0555 09/14/21 0930  BP:  (!) 118/53 (!) 152/59 (!) 137/49  Pulse:  75 71 78  Resp:  18 18 17   Temp:  98.2 F (36.8 C) 98.3 F (36.8 C) 97.9 F (36.6 C)  TempSrc:   Oral   SpO2:  96% 98% 98%  Weight: 95.7 kg     Height:       Weight change:   Physical Exam: General: obese Female chronically ill appearing  NAD  Heart: RRR no MRG Lungs: CTA  Abdomen: obese , NABS, Soft ,NT, ND, some abd wall edema  Extremities: R Thigh 1+ edema and compression hose, L BKA ,1+ thigh  edema, Dialysis Access: LA AVF + bruit       Dialysis Orders: [facility] MWF 4h  400/500  90kg  2/2 bath  P2  Heparin 4000 units IV TIW  LUA AVF   Problem/Plan:  L hip infection: S/p 08/10/21 surgery -> persistent infec VRE Cx > OR 08/28/2021 +08/31/21 for L hip debridement, wound vac .on Daptomycin/" ID recommends continuing Daptomycin 6 weeks on HD stop 10/12/21 // f/u with Dr. Candiss Norse on 7/20". ESRD - on HD MWF. Serial HD 06/28, 6/29 ,6/30. Marland Kitchen Vol . NA 125 =7/14= K 4.7. Follow trends. HD machine clotted off 7/12 near end of treatment./holding Heparin d/t current anemia. BUT  with Mach . Clotting off=  Start back use Heparin  7/14 hd . Access - Difficult cannulation on 7/07 . VVS consulted for duplex study but apparently patient refused. No issues with cannulation lately. Monitor access closely. HTN/ volume: BP high admit, improved some with UF vol. Weight up again. Net UF 4 Last hd . Bed wts. Not Accurate .On amlodipine 5mg  q d and carved. 6.25mg  bid /may need to taper down BP meds Anemia of ESRD -7/14= hgb 8.4 ,Hgb declined post op - 2U PRBCs given 6/29. Was  Holding heparin with HD.BUT with HD TXs  clotting =bld loss=   start back using Hep. 7/14  .Aranesp 200 mcg ^ recently Monitor trend. Secondary HPTH: CCa in  range, PO4  5.3  Cont binders.  DM2: Insulin per primary Dispo: Will need to dialyze in recliner prior to discharge. She has been counseled on this thoroughly  in order for her to continue HD at [place], PA[facility] [place] [phone] 09/14/2021,1:01 PM  LOS: 19 days   Labs: Basic Metabolic Panel: Recent Labs  Lab 09/10/21 0334 09/11/21 0323 09/13/21 0549  NA 128* 124* 125*  K 4.0 4.5 4.7  CL 91* 91* 90*  CO2 28 25 23   GLUCOSE 165* 160* 118*  BUN 30* 39* 37*  CREATININE 4.03* 4.74* 4.46*  CALCIUM 8.1* 8.2* 8.3*  PHOS  --  4.4 5.3*   Liver Function Tests: Recent Labs  Lab 09/11/21 0323  ALBUMIN 2.4*   No results for input(s): "LIPASE", "AMYLASE" in the last 168 hours. No results for input(s): "AMMONIA" in the last 168 hours. CBC: Recent Labs  Lab 09/11/21 0323 09/13/21 0549  WBC 9.3 8.1  HGB 8.0* 8.4*  HCT 24.7* 26.0*  MCV 94.3 93.2  PLT 225 241   Cardiac Enzymes: Recent Labs  Lab 09/13/21 0549  CKTOTAL 39   CBG: Recent Labs  Lab 09/13/21 0728 09/13/21 1146 09/13/21 2050  09/14/21 0738 09/14/21 1127  GLUCAP 129* 119* 177* 150* 170*    Studies/Results: No results found. Medications:  sodium chloride     sodium chloride Stopped (08/29/21 0139)   sodium chloride     DAPTOmycin (CUBICIN) 900 mg in sodium chloride 0.9 % IVPB 900 mg (09/13/21 2145)   methocarbamol (ROBAXIN) IV      alosetron  1 mg Oral BID   amitriptyline  25 mg Oral QHS   amLODipine  5 mg Oral Daily   atorvastatin  40 mg Oral q1800   carvedilol  6.25 mg Oral BID   Chlorhexidine Gluconate Cloth  6 each Topical Q0600   cyclobenzaprine  10 mg Oral TID   darbepoetin (ARANESP) injection - DIALYSIS  200 mcg Intravenous Q Fri-HD   docusate sodium  100 mg Oral BID   DULoxetine  60 mg Oral Daily   heparin  5,000 Units Subcutaneous Q8H   hydrALAZINE  50 mg Oral TID   insulin aspart  0-6 Units Subcutaneous TID WC   insulin aspart  5 Units  Subcutaneous TID WC   insulin glargine-yfgn  12 Units Subcutaneous QHS   multivitamin  1 tablet Oral QHS   pantoprazole  80 mg Oral Daily   sevelamer carbonate  1,600 mg Oral TID WC   sodium chloride flush  3 mL Intravenous Q12H

## 2021-09-15 DIAGNOSIS — M86252 Subacute osteomyelitis, left femur: Secondary | ICD-10-CM | POA: Diagnosis not present

## 2021-09-15 LAB — GLUCOSE, CAPILLARY
Glucose-Capillary: 74 mg/dL (ref 70–99)
Glucose-Capillary: 129 mg/dL — ABNORMAL HIGH (ref 70–99)
Glucose-Capillary: 141 mg/dL — ABNORMAL HIGH (ref 70–99)
Glucose-Capillary: 161 mg/dL — ABNORMAL HIGH (ref 70–99)

## 2021-09-15 MED ORDER — ORAL CARE MOUTH RINSE: 15.0000 mL | OROMUCOSAL | Status: DC | PRN

## 2021-09-15 MED ORDER — CHLORHEXIDINE GLUCONATE CLOTH 2 % EX PADS
6.0000 | MEDICATED_PAD | Freq: Every day | CUTANEOUS | Status: DC
Start: 2021-09-16 — End: 2021-09-18

## 2021-09-15 NOTE — Progress Notes (Addendum)
Subjective: Seen in room eating lunch no complaints, states she will attempt recliner dialysis tomorrow.  Discussed with her increasing time to 4 hours 15 minutes in attempting to pull fluid off she agrees  Objective Vital signs in last 24 hours: Vitals:   09/14/21 1644 09/14/21 2028 09/15/21 0527 09/15/21 0909  BP: (!) 142/62 126/66 (!) 160/74 140/65  Pulse: 70 73 73 71  Resp: 17 19 18 17   Temp: 98.2 F (36.8 C) 98.6 F (37 C) 97.9 F (36.6 C) 98 F (36.7 C)  TempSrc:  Oral Oral   SpO2: 100% 98% 100% 98%  Weight:      Height:       Weight change:   Physical Exam: General: obese Female chronically ill appearing  NAD  Heart: RRR no MRG Lungs: CTA bilaterally nonlabored breathing Abdomen: obese , NABS, Soft ,NT, ND, some abd wall edema  Extremities: R Thigh 1+ edema and compression hose, L BKA ,1+ thigh  edema, left hip wound VAC Dialysis Access: LA AVF + bruit       Dialysis Orders: [facility] MWF 4h  400/500  90kg  2/2 bath  P2  Heparin 4000 units IV TIW  LUA AVF   Problem/Plan:  L hip infection: S/p 08/10/21 surgery -> persistent infec VRE Cx > OR 08/28/2021 +08/31/21 for L hip debridement, wound vac .on Daptomycin/" ID recommends continuing Daptomycin 6 weeks on HD stop 10/12/21 // f/u with Dr. Candiss Norse on 7/20". ESRD - on HD MWF. Serial HD 06/28, 6/29 ,6/30. Marland Kitchen Vol . NA 125 =7/14= K 4.7. Follow trends. HD machine clotted off 7/12 near end of treatment./holding Heparin d/t current anemia. BUT  with Mach . Clotting off=  Started back use Heparin  7/14 hd .  Tomorrow 4 hours 15 minutes to help help facilitate UF patient agrees also recliner hemodialysis Access - Difficult cannulation on 7/07 . VVS consulted for duplex study but apparently patient refused. No issues with cannulation lately. Monitor access closely. HTN/ volume: BP high admit, improved some with UF vol. Weight up again. Net UF 4 Last hd . Bed wts. Not Accurate .On amlodipine 5mg  q d and carved. 6.25mg  bid /may need to taper down  BP meds attempt 4 L UF tomorrow use isolated UF Anemia of ESRD -7/14= hgb 8.4 ,Hgb declined post op - 2U PRBCs given 6/29. Was  Holding heparin with HD.BUT with HD TXs  clotting =bld loss=   start back using Hep. 7/14  .Aranesp 200 mcg ^ recently Monitor trend.,  Need to check iron studies Secondary HPTH: CCa in range, PO4  5.3  Cont binders.  DM2: Insulin per primary Dispo: Will need to dialyze in recliner prior to discharge. She has been counseled on this thoroughly  in order for her to continue HD at [place], [facility] [place] [phone] 09/15/2021,12:03 PM  LOS: 20 days   Labs: Basic Metabolic Panel: Recent Labs  Lab 09/10/21 0334 09/11/21 0323 09/13/21 0549  NA 128* 124* 125*  K 4.0 4.5 4.7  CL 91* 91* 90*  CO2 28 25 23   GLUCOSE 165* 160* 118*  BUN 30* 39* 37*  CREATININE 4.03* 4.74* 4.46*  CALCIUM 8.1* 8.2* 8.3*  PHOS  --  4.4 5.3*   Liver Function Tests: Recent Labs  Lab 09/11/21 0323  ALBUMIN 2.4*   No results for input(s): "LIPASE", "AMYLASE" in the last 168 hours. No results for input(s): "AMMONIA" in the last 168 hours. CBC: Recent Labs  Lab  09/11/21 0323 09/13/21 0549  WBC 9.3 8.1  HGB 8.0* 8.4*  HCT 24.7* 26.0*  MCV 94.3 93.2  PLT 225 241   Cardiac Enzymes: Recent Labs  Lab 09/13/21 0549  CKTOTAL 39   CBG: Recent Labs  Lab 09/14/21 1127 09/14/21 1642 09/14/21 2028 09/15/21 0747 09/15/21 1121  GLUCAP 170* 134* 167* 74 129*    Studies/Results: No results found. Medications:  sodium chloride     sodium chloride Stopped (08/29/21 0139)   sodium chloride     DAPTOmycin (CUBICIN) 900 mg in sodium chloride 0.9 % IVPB 900 mg (09/13/21 2145)   methocarbamol (ROBAXIN) IV      alosetron  1 mg Oral BID   amitriptyline  25 mg Oral QHS   amLODipine  5 mg Oral Daily   atorvastatin  40 mg Oral q1800   carvedilol  6.25 mg Oral BID   Chlorhexidine Gluconate Cloth  6 each Topical Q0600   cyclobenzaprine  10  mg Oral TID   darbepoetin (ARANESP) injection - DIALYSIS  200 mcg Intravenous Q Fri-HD   docusate sodium  100 mg Oral BID   DULoxetine  60 mg Oral Daily   heparin  5,000 Units Subcutaneous Q8H   hydrALAZINE  50 mg Oral TID   insulin aspart  0-6 Units Subcutaneous TID WC   insulin aspart  5 Units Subcutaneous TID WC   insulin glargine-yfgn  12 Units Subcutaneous QHS   multivitamin  1 tablet Oral QHS   pantoprazole  80 mg Oral Daily   sevelamer carbonate  1,600 mg Oral TID WC   sodium chloride flush  3 mL Intravenous Q12H

## 2021-09-15 NOTE — Progress Notes (Signed)
Physical Therapy Treatment Patient Details Name: [name] MRN: [id number] DOB: 02-Jun-1965 Today's Date: 09/15/2021   History of Present Illness 56 y.o. female presented to ED 6/26 from dialysis with increased bloody drainage from L hip wound. 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 s/p hardware removal of left hip with partial resection of tuft tissue and femure that were nonviable on 08/10/21 Discharged home 6/23. underwent L hip debridement and wound vac placement 6/28; +Left intertrochanteric non-union,  Fracture of left distal femur  PMH: hypertension, hyperlipidemia, ESRD on HD MWF, history of left BKA in 2021, depression/anxiety, stroke, tobacco use, T2DM,  insomnia, chronic pain syndrome,    PT Comments    Continuing work on functional mobility and activity tolerance;  Session focused on functional transfers; pt voiced fear of falling, and feeling like she is rushed at times with transfers; We discussed modes and options for transfers OOB, and opted today to work on lateral scooting as a safe way to get OOB to the chair, while somewhat allaying pt's fear of falling; transferred bed to HD recliner and back with armrest down with light mod assist, 2 persons for safety; Overall inefficient and energetically taxing scoots, but pt reported less pain in R knee with this technique;   Updated pt's dc plan to AIR -- she had a relatively recent AIR stay with good outcomes, and seems more motivated to return to her prior independent functional level with transfers  Recommendations for follow up therapy are one component of a multi-disciplinary discharge planning process, led by the attending physician.  Recommendations may be updated based on patient status, additional functional criteria and insurance authorization.  Follow Up Recommendations  Acute inpatient rehab (3hours/day) Can patient physically be transported by private vehicle: No   Assistance  Recommended at Discharge Intermittent Supervision/Assistance  Patient can return home with the following A lot of help with walking and/or transfers;A lot of help with bathing/dressing/bathroom;Assistance with cooking/housework;Assist for transportation   Equipment Recommendations  Other (comment) (Seems well-equipped; agree with looking into medical van transport for HD)    Recommendations for Other Services OT consult     Precautions / Restrictions Precautions Precautions: Fall;Other (comment) Precaution Comments: L BKA (baseline), L LE wound vac Restrictions LLE Weight Bearing: Non weight bearing Other Position/Activity Restrictions: L distal femur fx--non union     Mobility  Bed Mobility Overal bed mobility: Needs Assistance Bed Mobility: Sidelying to Sit, Sit to Sidelying   Sidelying to sit: Supervision     Sit to sidelying: Min guard General bed mobility comments: increased time and effort; used bedrails effectively    Transfers Overall transfer level: Needs assistance                Lateral/Scoot Transfers: Mod assist, +2 safety/equipment General transfer comment: Opted to work on lateral scooting for bed<> HD recliner; Scooted bed to recliner (armrest down) with 2 person assist (one "recieving" at recliner, and one in front of pt to decr fear of falling) and light mod assist with use of bed pad; Roughly the same with getting back to bed as well; Pt with short, inefficient scoots that lead to fatigue, but she reports less pain overall with lateral scoot transfer    Ambulation/Gait                   Stairs             Wheelchair Mobility    Modified Rankin (Stroke Patients Only)  Balance     Sitting balance-Leahy Scale: Good Sitting balance - Comments: ability to transfer weight left and right in sitting                                    Cognition Arousal/Alertness: Awake/alert Behavior During Therapy: WFL for  tasks assessed/performed Overall Cognitive Status: Within Functional Limits for tasks assessed                                 General Comments: Needs extra time to allow caregivers to truly listen to her        Exercises      General Comments General comments (skin integrity, edema, etc.): Discussed exercises that will help with strengthening gluteals, and help with stretching hip flexors, bolstered bridging in particular as long as bolster is proximial to non-healing fx LLE; Used HD recliner today in the hopes of familiarizing pt with getting in and out of that particular type of recliner; Hopefully we can get to consistently helping her OOB to HD recliner prior to HD treatment      Pertinent Vitals/Pain Pain Assessment Pain Assessment: Faces Faces Pain Scale: Hurts little more Pain Location: L knee Pain Descriptors / Indicators: Discomfort, Sore Pain Intervention(s): Monitored during session    Home Living                          Prior Function            PT Goals (current goals can now be found in the care plan section) Acute Rehab PT Goals Patient Stated Goal: less pain with movement PT Goal Formulation: With patient Time For Goal Achievement: 09/26/21 Potential to Achieve Goals: Fair Progress towards PT goals: Progressing toward goals    Frequency    Min 3X/week      PT Plan Discharge plan needs to be updated    Co-evaluation              AM-PAC PT "6 Clicks" Mobility   Outcome Measure  Help needed turning from your back to your side while in a flat bed without using bedrails?: None Help needed moving from lying on your back to sitting on the side of a flat bed without using bedrails?: A Little Help needed moving to and from a bed to a chair (including a wheelchair)?: A Lot Help needed standing up from a chair using your arms (e.g., wheelchair or bedside chair)?: Total Help needed to walk in hospital room?: Total Help  needed climbing 3-5 steps with a railing? : Total 6 Click Score: 12    End of Session Equipment Utilized During Treatment: Other (comment) (bed pad) Activity Tolerance: Patient tolerated treatment well Patient left: in bed;with call bell/phone within reach;with bed alarm set Nurse Communication: Mobility status (and perhaps check on VAC when she goes back to pt's room) PT Visit Diagnosis: Other abnormalities of gait and mobility (R26.89);Muscle weakness (generalized) (M62.81);History of falling (Z91.81);Pain Pain - Right/Left: Left Pain - part of body: Leg     Time: 7829-5621 PT Time Calculation (min) (ACUTE ONLY): 54 min  Charges:  $Therapeutic Activity: 38-52 mins $Self Care/Home Management: [place] Office  [phone]    Colletta [place] 09/15/2021, 5:16 PM

## 2021-09-15 NOTE — Progress Notes (Signed)
PROGRESS NOTE    [name]  WIO:[id number] DOB: September 08, 1965 DOA: 08/26/2021 PCP: [name], MD     Brief Narrative:  [name] is a 56 year old female with end-stage renal disease on hemodialysis, hypertension, hyperlipidemia, left BKA, history of CVA, left SFA revascularization, depression and anxiety, type 2 diabetes mellitus, tobacco use, chronic pain syndrome, left femur fracture after mechanical fall status post IM nail in 10/22 followed by left hip osteomyelitis and soft tissue abscesses status post multiple surgeries including removal of hardware, I&D and partial resection of the femur, receiving cefazolin on HD who presented to the hospital on 6/26 with bloody drainage from the surgical incision site. CT scan revealed ununited left inter-trochanteric fracture, soft tissue wound overlying the greater trochanter with air extending from the skin surface to the joint space concerning for infection. Dr. [name] was consulted and the patient underwent an I&D on 6/28 and again on 7/1. Cultures have grown out VRE and the patient is receiving daptomycin per ID. Unable to dc as she declines to sit up in a chair despite numerous encouragement and needs to be able to do so for outpt dialysis  New events last 24 hours / Subjective: No complaints today.  In good spirits overall.  Encouraged patient to continue physical therapy and mobility  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  Active Problems:   Closed intertrochanteric fracture of hip, left, sequela   ESRD (end stage renal disease) on dialysis ([place])   Uncontrolled type 2 diabetes mellitus with hypoglycemia, with long-term current use of insulin (HCC)   History of CVA (cerebrovascular accident)   Benign essential HTN   Dyslipidemia, goal LDL below 100   Anxiety and depression   GASTROESOPHAGEAL REFLUX, NO ESOPHAGITIS   TOBACCO DEPENDENCE   Wound infection   Iron deficiency  anemia, unspecified   Wound dehiscence, surgical, sequela   Left femoral shaft fracture (HCC)   Subcutaneous osteomyelitis of the left femur with abscess, infected left hip hardware status post removal, left intertrochanteric fracture -Wound VAC to the left outer thigh -Infectious disease recommending daptomycin for 6 weeks with dialysis, stop date 8/12.  Follow-up with Dr. Candiss Norse 7/20  ESRD -Dialysis MWF.  Patient needs to be able to sit in chair to tolerate outpatient dialysis for discharge -Nephrology following  Anemia of chronic disease, component of acute blood loss anemia from wound -Status post 2 unit packed red blood cells 6/29 -Hemoglobin stable   Uncontrolled type 2 diabetes with hypoglycemia -A1c 7.1 -Semglee, NovoLog, sliding scale  Hypertension -Amlodipine, Coreg, hydralazine  Hyperlipidemia -Lipitor  DVT prophylaxis:  Place TED hose Start: 09/06/21 1507 SCDs Start: 08/31/21 1058 SCDs Start: 08/28/21 1049 heparin injection 5,000 Units Start: 08/26/21 1615  Code Status: Full code Family Communication: No family at bedside Disposition Plan:  Status is: Inpatient Remains inpatient appropriate because: Patient needs to continue to work on sitting in chair for dialysis.  She is declining SNF placement.   Antimicrobials:  Anti-infectives (From admission, onward)    Start     Dose/Rate Route Frequency Ordered Stop   09/02/21 2000  DAPTOmycin (CUBICIN) 900 mg in sodium chloride 0.9 % IVPB        10 mg/kg  92.4 kg 136 mL/hr over 30 Minutes Intravenous Once per day on Mon Wed Fri 09/02/21 0836 10/12/21 2359   08/31/21 0745  ceFAZolin (ANCEF) IVPB 2g/100 mL premix        2 g 200 mL/hr over  30 Minutes Intravenous To Short Stay 08/31/21 0730 08/31/21 0918   08/30/21 0600  ceFAZolin (ANCEF) IVPB 2g/100 mL premix  Status:  Discontinued        2 g 200 mL/hr over 30 Minutes Intravenous To Short Stay 08/30/21 0123 08/31/21 0600   08/29/21 2000  DAPTOmycin (CUBICIN) 900  mg in sodium chloride 0.9 % IVPB  Status:  Discontinued        10 mg/kg  92.4 kg 136 mL/hr over 30 Minutes Intravenous Every 48 hours 08/29/21 1627 09/02/21 0836   08/28/21 1800  ceFAZolin (ANCEF) IVPB 2g/100 mL premix  Status:  Discontinued        2 g 200 mL/hr over 30 Minutes Intravenous Every M-W-F (1800) 08/27/21 0846 08/29/21 1620   08/28/21 1200  ceFAZolin (ANCEF) IVPB 1 g/50 mL premix  Status:  Discontinued        1 g 100 mL/hr over 30 Minutes Intravenous Every M-W-F (Hemodialysis) 08/26/21 1954 08/27/21 0846   08/28/21 0600  ceFAZolin (ANCEF) IVPB 2g/100 mL premix        2 g 200 mL/hr over 30 Minutes Intravenous On call to O.R. 08/27/21 1150 08/28/21 0909   08/27/21 1800  ceFAZolin (ANCEF) IVPB 1 g/50 mL premix        1 g 100 mL/hr over 30 Minutes Intravenous  Once 08/26/21 1954 08/27/21 2048   08/26/21 1500  ceFEPIme (MAXIPIME) 1 g in sodium chloride 0.9 % 100 mL IVPB  Status:  Discontinued        1 g 200 mL/hr over 30 Minutes Intravenous Every 24 hours 08/26/21 1452 08/26/21 1714        Objective: Vitals:   09/14/21 1644 09/14/21 2028 09/15/21 0527 09/15/21 0909  BP: (!) 142/62 126/66 (!) 160/74 140/65  Pulse: 70 73 73 71  Resp: 17 19 18 17   Temp: 98.2 F (36.8 C) 98.6 F (37 C) 97.9 F (36.6 C) 98 F (36.7 C)  TempSrc:  Oral Oral   SpO2: 100% 98% 100% 98%  Weight:      Height:        Intake/Output Summary (Last 24 hours) at 09/15/2021 1235 Last data filed at 09/15/2021 0847 Gross per 24 hour  Intake 840 ml  Output 450 ml  Net 390 ml    Filed Weights   09/11/21 1300 09/13/21 1520 09/13/21 1838  Weight: 96 kg 103.4 kg 95.7 kg    Examination:  General exam: Appears calm and comfortable  Respiratory system: Clear to auscultation. Respiratory effort normal. No respiratory distress. No conversational dyspnea.  Cardiovascular system: S1 & S2 heard, RRR. No murmurs. TED hose on right lower extremity, +dependent edema bilateral thighs  Gastrointestinal  system: Abdomen is nondistended, soft and nontender. Normal bowel sounds heard. Central nervous system: Alert and oriented. No focal neurological deficits. Speech clear.  Extremities: Left lower extremity status post BKA, wound VAC in place Psychiatry: Judgement and insight appear normal. Mood & affect appropriate.   Data Reviewed: I have personally reviewed following labs and imaging studies  CBC: Recent Labs  Lab 09/11/21 0323 09/13/21 0549  WBC 9.3 8.1  HGB 8.0* 8.4*  HCT 24.7* 26.0*  MCV 94.3 93.2  PLT 225 947    Basic Metabolic Panel: Recent Labs  Lab 09/09/21 0036 09/10/21 0334 09/11/21 0323 09/13/21 0549  NA 123* 128* 124* 125*  K 4.9 4.0 4.5 4.7  CL 86* 91* 91* 90*  CO2 27 28 25 23   GLUCOSE 152* 165* 160* 118*  BUN 50*  30* 39* 37*  CREATININE 6.04* 4.03* 4.74* 4.46*  CALCIUM 8.3* 8.1* 8.2* 8.3*  PHOS  --   --  4.4 5.3*    GFR: Estimated Creatinine Clearance: 18.4 mL/min (A) (by C-G formula based on SCr of 4.46 mg/dL (H)). Liver Function Tests: Recent Labs  Lab 09/11/21 0323  ALBUMIN 2.4*    No results for input(s): "LIPASE", "AMYLASE" in the last 168 hours. No results for input(s): "AMMONIA" in the last 168 hours. Coagulation Profile: No results for input(s): "INR", "PROTIME" in the last 168 hours. Cardiac Enzymes: Recent Labs  Lab 09/13/21 0549  CKTOTAL 39    BNP (last 3 results) No results for input(s): "PROBNP" in the last 8760 hours. HbA1C: No results for input(s): "HGBA1C" in the last 72 hours. CBG: Recent Labs  Lab 09/14/21 1127 09/14/21 1642 09/14/21 2028 09/15/21 0747 09/15/21 1121  GLUCAP 170* 134* 167* 74 129*    Lipid Profile: No results for input(s): "CHOL", "HDL", "LDLCALC", "TRIG", "CHOLHDL", "LDLDIRECT" in the last 72 hours. Thyroid Function Tests: No results for input(s): "TSH", "T4TOTAL", "FREET4", "T3FREE", "THYROIDAB" in the last 72 hours. Anemia Panel: No results for input(s): "VITAMINB12", "FOLATE", "FERRITIN",  "TIBC", "IRON", "RETICCTPCT" in the last 72 hours. Sepsis Labs: No results for input(s): "PROCALCITON", "LATICACIDVEN" in the last 168 hours.  No results found for this or any previous visit (from the past 240 hour(s)).    Radiology Studies: No results found.    Scheduled Meds:  alosetron  1 mg Oral BID   amitriptyline  25 mg Oral QHS   amLODipine  5 mg Oral Daily   atorvastatin  40 mg Oral q1800   carvedilol  6.25 mg Oral BID   Chlorhexidine Gluconate Cloth  6 each Topical Q0600   [START ON 09/16/2021] Chlorhexidine Gluconate Cloth  6 each Topical Q0600   cyclobenzaprine  10 mg Oral TID   darbepoetin (ARANESP) injection - DIALYSIS  200 mcg Intravenous Q Fri-HD   docusate sodium  100 mg Oral BID   DULoxetine  60 mg Oral Daily   heparin  5,000 Units Subcutaneous Q8H   hydrALAZINE  50 mg Oral TID   insulin aspart  0-6 Units Subcutaneous TID WC   insulin aspart  5 Units Subcutaneous TID WC   insulin glargine-yfgn  12 Units Subcutaneous QHS   multivitamin  1 tablet Oral QHS   pantoprazole  80 mg Oral Daily   sevelamer carbonate  1,600 mg Oral TID WC   sodium chloride flush  3 mL Intravenous Q12H   Continuous Infusions:  sodium chloride     sodium chloride Stopped (08/29/21 0139)   sodium chloride     DAPTOmycin (CUBICIN) 900 mg in sodium chloride 0.9 % IVPB 900 mg (09/13/21 2145)   methocarbamol (ROBAXIN) IV       LOS: 20 days     Dessa Phi, DO [facility] 09/15/2021, 12:35 PM   Available via Epic secure chat 7am-7pm After these hours, please refer to coverage provider listed on [email or web address]

## 2021-09-16 DIAGNOSIS — M86252 Subacute osteomyelitis, left femur: Secondary | ICD-10-CM | POA: Diagnosis not present

## 2021-09-16 LAB — GLUCOSE, CAPILLARY
Glucose-Capillary: 114 mg/dL — ABNORMAL HIGH (ref 70–99)
Glucose-Capillary: 129 mg/dL — ABNORMAL HIGH (ref 70–99)
Glucose-Capillary: 169 mg/dL — ABNORMAL HIGH (ref 70–99)

## 2021-09-16 LAB — CBC
HCT: 26.5 % — ABNORMAL LOW (ref 36.0–46.0)
Hemoglobin: 8.5 g/dL — ABNORMAL LOW (ref 12.0–15.0)
MCH: 29.7 pg (ref 26.0–34.0)
MCHC: 32.1 g/dL (ref 30.0–36.0)
MCV: 92.7 fL (ref 80.0–100.0)
Platelets: 244 10*3/uL (ref 150–400)
RBC: 2.86 MIL/uL — ABNORMAL LOW (ref 3.87–5.11)
RDW: 15.3 % (ref 11.5–15.5)
WBC: 7.9 10*3/uL (ref 4.0–10.5)
nRBC: 0 % (ref 0.0–0.2)

## 2021-09-16 LAB — BASIC METABOLIC PANEL
Anion gap: 11 (ref 5–15)
BUN: 45 mg/dL — ABNORMAL HIGH (ref 6–20)
CO2: 24 mmol/L (ref 22–32)
Calcium: 8.3 mg/dL — ABNORMAL LOW (ref 8.9–10.3)
Chloride: 89 mmol/L — ABNORMAL LOW (ref 98–111)
Creatinine, Ser: 5.5 mg/dL — ABNORMAL HIGH (ref 0.44–1.00)
GFR, Estimated: 9 mL/min — ABNORMAL LOW (ref >60)
Glucose, Bld: 133 mg/dL — ABNORMAL HIGH (ref 70–99)
Potassium: 5.3 mmol/L — ABNORMAL HIGH (ref 3.5–5.1)
Sodium: 124 mmol/L — ABNORMAL LOW (ref 135–145)

## 2021-09-16 LAB — IRON AND TIBC
Iron: 50 ug/dL (ref 28–170)
Saturation Ratios: 22 % (ref 10.4–31.8)
TIBC: 225 ug/dL — ABNORMAL LOW (ref 250–450)
UIBC: 175 ug/dL

## 2021-09-16 LAB — FERRITIN: Ferritin: 532 ng/mL — ABNORMAL HIGH (ref 11–307)

## 2021-09-16 NOTE — Progress Notes (Signed)
Contacted inpt HD unit and spoke to RN. Pt declined to sit in chair today for HD. Inpt HD unit staff continue to ask/request for pt to sit in chair for HD and pt continues to decline per staff.   Melven Sartorius Renal Navigator [phone]

## 2021-09-16 NOTE — Progress Notes (Signed)
Nephrology Follow-Up Consult note   Assessment/Recommendations: [name] is a/an 56 y.o. female with a past medical history significant for ESRD, HTN, HLD, left BKA, CVA, depression, anxiety, DM 2, chronic pain, admitted for left hip infection.       Dialysis Orders: [facility] MWF 4h  400/500  90kg  2/2 bath  P2  Heparin 4000 units IV TIW  LUA AVF     L hip infection: S/p 08/10/21 surgery -> persistent infec VRE Cx > OR 08/28/2021 +08/31/21 for L hip debridement, wound vac .on Daptomycin/" ID recommends continuing Daptomycin 6 weeks on HD stop 10/12/21 // f/u with Dr. Candiss Norse on 7/20". ESRD - on HD MWF.  Required serial HD for volume.  Some clotting issues started back use Heparin  7/14 hd .  Extended time to 4 hours 15 minutes to help help facilitate UF patient agrees also recliner hemodialysis Access - Difficult cannulation on 7/07 .  Patient refused further studies.  Access working more appropriately at this time HTN/ volume: Continue with aggressive ultrafiltration.  Recommended limiting p.o. fluids.  Continue current blood pressure medication Anemia of ESRD - required two transfusions previously. Aranesp 24mcg weekly. Recheck iron stores Secondary HPTH: Calcium and phosphorus acceptable.  Continue binders DM2: Insulin per primary Hyponatremia/hyperkalemia: Hyponatremia related to volume excess.  Hyperkalemia mild at 5.3.  Dialysis today Dispo: Must dialyze in a chair   Recommendations conveyed to primary service.    [facility] 09/16/2021 8:55 AM  ___________________________________________________________  CC: Hip infection  Interval History/Subjective: Patient has worsening edema.  Feels like it got worse overnight.  No shortness of breath.  No other changes   Medications:  Current Facility-Administered Medications  Medication Dose Route Frequency Provider Last Rate Last Admin   0.9 %  sodium chloride infusion  250 mL Intravenous PRN Newt Minion, MD       0.9 %  sodium chloride infusion   Intravenous Continuous Newt Minion, MD   Stopped at 08/29/21 0139   0.9 %  sodium chloride infusion   Intravenous Continuous Newt Minion, MD       acetaminophen (TYLENOL) tablet 325-650 mg  325-650 mg Oral Q6H PRN Newt Minion, MD       alosetron (LOTRONEX) tablet 1 mg  1 mg Oral BID Newt Minion, MD   1 mg at 09/16/21 0850   amitriptyline (ELAVIL) tablet 25 mg  25 mg Oral QHS Newt Minion, MD   25 mg at 09/15/21 2122   amLODipine (NORVASC) tablet 5 mg  5 mg Oral Daily Newt Minion, MD   5 mg at 09/15/21 1800   atorvastatin (LIPITOR) tablet 40 mg  40 mg Oral q1800 Newt Minion, MD   40 mg at 09/15/21 1800   bisacodyl (DULCOLAX) suppository 10 mg  10 mg Rectal Daily PRN Newt Minion, MD       calcium carbonate (TUMS - dosed in mg elemental calcium) chewable tablet 200 mg of elemental calcium  1 tablet Oral Q2H PRN Shalhoub, Sherryll Burger, MD   200 mg of elemental calcium at 09/11/21 0658   carvedilol (COREG) tablet 6.25 mg  6.25 mg Oral BID Newt Minion, MD   6.25 mg at 09/15/21 2122   Chlorhexidine Gluconate Cloth 2 % PADS 6 each  6 each Topical Q0600 Lynnda Child, PA-C   6 each at 09/15/21 3329   Chlorhexidine Gluconate Cloth 2 % PADS 6 each  6 each  Topical Q0600 Ernest Haber, PA-C       cyclobenzaprine (FLEXERIL) tablet 10 mg  10 mg Oral TID Newt Minion, MD   10 mg at 09/16/21 0849   DAPTOmycin (CUBICIN) 900 mg in sodium chloride 0.9 % IVPB  10 mg/kg Intravenous Once per day on Mon Wed Fri Laurice Record, MD 136 mL/hr at 09/13/21 2145 900 mg at 09/13/21 2145   Darbepoetin Alfa (ARANESP) injection 200 mcg  200 mcg Intravenous Q Fri-HD Tobie Poet E, NP   200 mcg at 09/13/21 1615   docusate sodium (COLACE) capsule 100 mg  100 mg Oral BID Newt Minion, MD   100 mg at 09/15/21 2122   DULoxetine (CYMBALTA) DR capsule 60 mg  60 mg Oral Daily Newt Minion, MD   60 mg at 09/16/21 0849   heparin injection 5,000  Units  5,000 Units Subcutaneous Q8H Newt Minion, MD   5,000 Units at 09/16/21 9937   hydrALAZINE (APRESOLINE) tablet 50 mg  50 mg Oral TID Newt Minion, MD   50 mg at 09/15/21 2122   insulin aspart (novoLOG) injection 0-6 Units  0-6 Units Subcutaneous TID WC Newt Minion, MD   1 Units at 09/14/21 1217   insulin aspart (novoLOG) injection 5 Units  5 Units Subcutaneous TID WC Donne Hazel, MD   5 Units at 09/15/21 1203   insulin glargine-yfgn (SEMGLEE) injection 12 Units  12 Units Subcutaneous QHS Donne Hazel, MD   12 Units at 09/15/21 2158   methocarbamol (ROBAXIN) tablet 500 mg  500 mg Oral Q6H PRN Newt Minion, MD   500 mg at 09/15/21 2123   Or   methocarbamol (ROBAXIN) 500 mg in dextrose 5 % 50 mL IVPB  500 mg Intravenous Q6H PRN Newt Minion, MD       multivitamin (RENA-VIT) tablet 1 tablet  1 tablet Oral QHS Newt Minion, MD   1 tablet at 09/15/21 2123   ondansetron (ZOFRAN) tablet 4 mg  4 mg Oral Q6H PRN Newt Minion, MD       Or   ondansetron [facility]) injection 4 mg  4 mg Intravenous Q6H PRN Newt Minion, MD   4 mg at 09/13/21 1696   Oral care mouth rinse  15 mL Mouth Rinse PRN Dessa Phi, DO       oxyCODONE (Oxy IR/ROXICODONE) immediate release tablet 10-15 mg  10-15 mg Oral Q3H PRN Newt Minion, MD   15 mg at 09/16/21 7893   oxyCODONE (Oxy IR/ROXICODONE) immediate release tablet 5-10 mg  5-10 mg Oral Q3H PRN Newt Minion, MD   5 mg at 09/06/21 2128   pantoprazole (PROTONIX) EC tablet 80 mg  80 mg Oral Daily Newt Minion, MD   80 mg at 09/16/21 0848   polyethylene glycol (MIRALAX / GLYCOLAX) packet 17 g  17 g Oral Daily PRN Newt Minion, MD       prochlorperazine (COMPAZINE) injection 10 mg  10 mg Intravenous Q6H PRN Dessa Phi, DO   10 mg at 09/13/21 1136   sevelamer carbonate (RENVELA) tablet 1,600 mg  1,600 mg Oral TID WC Newt Minion, MD   1,600 mg at 09/16/21 0849   sodium chloride flush (NS) 0.9 % injection 3 mL  3 mL Intravenous Q12H Newt Minion, MD   3 mL at 09/16/21 0850   sodium chloride flush (NS) 0.9 % injection 3 mL  3 mL Intravenous PRN [name],  Illene Regulus, MD          Review of Systems: 10 systems reviewed and negative except per interval history/subjective  Physical Exam: Vitals:   09/15/21 2110 09/16/21 0630  BP: (!) 149/55 (!) 170/64  Pulse: 70 72  Resp: 18 16  Temp: 98.8 F (37.1 C) 98.1 F (36.7 C)  SpO2: 99%    No intake/output data recorded.  Intake/Output Summary (Last 24 hours) at 09/16/2021 0855 Last data filed at 09/16/2021 0600 Gross per 24 hour  Intake 368 ml  Output 825 ml  Net -457 ml   Constitutional: well-appearing, no acute distress, obese ENMT: ears and nose without scars or lesions, MMM CV: normal rate, trace edema in all 4 extremities Respiratory: Bilateral chest rise, normal work of breathing Gastrointestinal: soft, non-tender, no palpable masses or hernias Skin: no visible lesions or rashes Psych: alert, judgement/insight appropriate, appropriate mood and affect   Test Results I personally reviewed new and old clinical labs and radiology tests Lab Results  Component Value Date   NA 124 (L) 09/16/2021   K 5.3 (H) 09/16/2021   CL 89 (L) 09/16/2021   CO2 24 09/16/2021   BUN 45 (H) 09/16/2021   CREATININE 5.50 (H) 09/16/2021   GFR 36.28 (L) 08/01/2015   CALCIUM 8.3 (L) 09/16/2021   ALBUMIN 2.4 (L) 09/11/2021   PHOS 5.3 (H) 09/13/2021    CBC Recent Labs  Lab 09/11/21 0323 09/13/21 0549 09/16/21 0240  WBC 9.3 8.1 7.9  HGB 8.0* 8.4* 8.5*  HCT 24.7* 26.0* 26.5*  MCV 94.3 93.2 92.7  PLT 225 241 244

## 2021-09-16 NOTE — Progress Notes (Signed)
PROGRESS NOTE    [name]  RWE:[id number] DOB: [birth year] DOA: 08/26/2021 PCP: [name], MD     Brief Narrative:  [name] is a 56 year old female with end-stage renal disease on hemodialysis, hypertension, hyperlipidemia, left BKA, history of CVA, left SFA revascularization, depression and anxiety, type 2 diabetes mellitus, tobacco use, chronic pain syndrome, left femur fracture after mechanical fall status post IM nail in 10/22 followed by left hip osteomyelitis and soft tissue abscesses status post multiple surgeries including removal of hardware, I&D and partial resection of the femur, receiving cefazolin on HD who presented to the hospital on 6/26 with bloody drainage from the surgical incision site. CT scan revealed ununited left inter-trochanteric fracture, soft tissue wound overlying the greater trochanter with air extending from the skin surface to the joint space concerning for infection. Dr. [name] was consulted and the patient underwent an I&D on 6/28 and again on 7/1. Cultures have grown out VRE and the patient is receiving daptomycin per ID. Unable to dc as she declines to sit up in a chair despite numerous encouragement and needs to be able to do so for outpt dialysis  New events last 24 hours / Subjective: Was able to get to chair for a short period of time yesterday! Encouraged her to continue rehab efforts in order to tolerate HD in recliner.   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  Active Problems:   Closed intertrochanteric fracture of hip, left, sequela   ESRD (end stage renal disease) on dialysis ([place])   Uncontrolled type 2 diabetes mellitus with hypoglycemia, with long-term current use of insulin (HCC)   History of CVA (cerebrovascular accident)   Benign essential HTN   Dyslipidemia, goal LDL below 100   Anxiety and depression   GASTROESOPHAGEAL REFLUX, NO ESOPHAGITIS   TOBACCO DEPENDENCE   Wound  infection   Iron deficiency anemia, unspecified   Wound dehiscence, surgical, sequela   Left femoral shaft fracture (HCC)   Subcutaneous osteomyelitis of the left femur with abscess, infected left hip hardware status post removal, left intertrochanteric fracture -Wound VAC to the left outer thigh -Infectious disease recommending daptomycin for 6 weeks with dialysis, stop date 8/12.  Follow-up with Dr. Candiss Norse 7/20  ESRD -Dialysis MWF.  Patient needs to be able to sit in chair to tolerate outpatient dialysis for discharge -Nephrology following  Anemia of chronic disease, component of acute blood loss anemia from wound -Status post 2 unit packed red blood cells 6/29 -Hemoglobin stable   Uncontrolled type 2 diabetes with hypoglycemia -A1c 7.1 -Semglee, NovoLog, sliding scale  Hypertension -Amlodipine, Coreg, hydralazine  Hyperlipidemia -Lipitor  DVT prophylaxis:  Place TED hose Start: 09/06/21 1507 SCDs Start: 08/31/21 1058 SCDs Start: 08/28/21 1049 heparin injection 5,000 Units Start: 08/26/21 1615  Code Status: Full code Family Communication: No family at bedside Disposition Plan:  Status is: Inpatient Remains inpatient appropriate because: Patient needs to continue to work on sitting in chair for dialysis.  She is declining SNF placement.   Antimicrobials:  Anti-infectives (From admission, onward)    Start     Dose/Rate Route Frequency Ordered Stop   09/02/21 2000  DAPTOmycin (CUBICIN) 900 mg in sodium chloride 0.9 % IVPB        10 mg/kg  92.4 kg 136 mL/hr over 30 Minutes Intravenous Once per day on Mon Wed Fri 09/02/21 0836 10/12/21 2359   08/31/21 0745  ceFAZolin (ANCEF) IVPB 2g/100 mL premix  2 g 200 mL/hr over 30 Minutes Intravenous To Short Stay 08/31/21 0730 08/31/21 0918   08/30/21 0600  ceFAZolin (ANCEF) IVPB 2g/100 mL premix  Status:  Discontinued        2 g 200 mL/hr over 30 Minutes Intravenous To Short Stay 08/30/21 0123 08/31/21 0600   08/29/21  2000  DAPTOmycin (CUBICIN) 900 mg in sodium chloride 0.9 % IVPB  Status:  Discontinued        10 mg/kg  92.4 kg 136 mL/hr over 30 Minutes Intravenous Every 48 hours 08/29/21 1627 09/02/21 0836   08/28/21 1800  ceFAZolin (ANCEF) IVPB 2g/100 mL premix  Status:  Discontinued        2 g 200 mL/hr over 30 Minutes Intravenous Every M-W-F (1800) 08/27/21 0846 08/29/21 1620   08/28/21 1200  ceFAZolin (ANCEF) IVPB 1 g/50 mL premix  Status:  Discontinued        1 g 100 mL/hr over 30 Minutes Intravenous Every M-W-F (Hemodialysis) 08/26/21 1954 08/27/21 0846   08/28/21 0600  ceFAZolin (ANCEF) IVPB 2g/100 mL premix        2 g 200 mL/hr over 30 Minutes Intravenous On call to O.R. 08/27/21 1150 08/28/21 0909   08/27/21 1800  ceFAZolin (ANCEF) IVPB 1 g/50 mL premix        1 g 100 mL/hr over 30 Minutes Intravenous  Once 08/26/21 1954 08/27/21 2048   08/26/21 1500  ceFEPIme (MAXIPIME) 1 g in sodium chloride 0.9 % 100 mL IVPB  Status:  Discontinued        1 g 200 mL/hr over 30 Minutes Intravenous Every 24 hours 08/26/21 1452 08/26/21 1714        Objective: Vitals:   09/16/21 1100 09/16/21 1130 09/16/21 1200 09/16/21 1230  BP: (!) 144/71 (!) 142/64 135/60 135/78  Pulse: 66 71 70 70  Resp: (!) 9 10 (!) 9 10  Temp:      TempSrc:      SpO2: 100% 98% 99% 100%  Weight:      Height:        Intake/Output Summary (Last 24 hours) at 09/16/2021 1251 Last data filed at 09/16/2021 0900 Gross per 24 hour  Intake 708 ml  Output 825 ml  Net -117 ml    Filed Weights   09/13/21 1520 09/13/21 1838 09/16/21 0955  Weight: 103.4 kg 95.7 kg 100.4 kg    Examination:  General exam: Appears calm and comfortable  Respiratory system: Respiratory effort normal. No respiratory distress. No conversational dyspnea.  Cardiovascular system: +dependent edema bilateral thighs  Central nervous system: Alert and oriented.  Speech clear.  Extremities: Left lower extremity status post BKA, wound VAC in place Psychiatry:  Judgement and insight appear normal. Mood & affect appropriate.   Data Reviewed: I have personally reviewed following labs and imaging studies  CBC: Recent Labs  Lab 09/11/21 0323 09/13/21 0549 09/16/21 0240  WBC 9.3 8.1 7.9  HGB 8.0* 8.4* 8.5*  HCT 24.7* 26.0* 26.5*  MCV 94.3 93.2 92.7  PLT 225 241 952    Basic Metabolic Panel: Recent Labs  Lab 09/10/21 0334 09/11/21 0323 09/13/21 0549 09/16/21 0240  NA 128* 124* 125* 124*  K 4.0 4.5 4.7 5.3*  CL 91* 91* 90* 89*  CO2 28 25 23 24   GLUCOSE 165* 160* 118* 133*  BUN 30* 39* 37* 45*  CREATININE 4.03* 4.74* 4.46* 5.50*  CALCIUM 8.1* 8.2* 8.3* 8.3*  PHOS  --  4.4 5.3*  --     GFR:  Estimated Creatinine Clearance: 15.2 mL/min (A) (by C-G formula based on SCr of 5.5 mg/dL (H)). Liver Function Tests: Recent Labs  Lab 09/11/21 0323  ALBUMIN 2.4*    No results for input(s): "LIPASE", "AMYLASE" in the last 168 hours. No results for input(s): "AMMONIA" in the last 168 hours. Coagulation Profile: No results for input(s): "INR", "PROTIME" in the last 168 hours. Cardiac Enzymes: Recent Labs  Lab 09/13/21 0549  CKTOTAL 39    BNP (last 3 results) No results for input(s): "PROBNP" in the last 8760 hours. HbA1C: No results for input(s): "HGBA1C" in the last 72 hours. CBG: Recent Labs  Lab 09/15/21 0747 09/15/21 1121 09/15/21 1637 09/15/21 2110 09/16/21 0723  GLUCAP 74 129* 141* 161* 114*    Lipid Profile: No results for input(s): "CHOL", "HDL", "LDLCALC", "TRIG", "CHOLHDL", "LDLDIRECT" in the last 72 hours. Thyroid Function Tests: No results for input(s): "TSH", "T4TOTAL", "FREET4", "T3FREE", "THYROIDAB" in the last 72 hours. Anemia Panel: Recent Labs    09/16/21 1030  FERRITIN 532*  TIBC 225*  IRON 50   Sepsis Labs: No results for input(s): "PROCALCITON", "LATICACIDVEN" in the last 168 hours.  No results found for this or any previous visit (from the past 240 hour(s)).    Radiology Studies: No  results found.    Scheduled Meds:  alosetron  1 mg Oral BID   amitriptyline  25 mg Oral QHS   amLODipine  5 mg Oral Daily   atorvastatin  40 mg Oral q1800   carvedilol  6.25 mg Oral BID   Chlorhexidine Gluconate Cloth  6 each Topical Q0600   Chlorhexidine Gluconate Cloth  6 each Topical Q0600   cyclobenzaprine  10 mg Oral TID   darbepoetin (ARANESP) injection - DIALYSIS  200 mcg Intravenous Q Fri-HD   docusate sodium  100 mg Oral BID   DULoxetine  60 mg Oral Daily   heparin  5,000 Units Subcutaneous Q8H   hydrALAZINE  50 mg Oral TID   insulin aspart  0-6 Units Subcutaneous TID WC   insulin aspart  5 Units Subcutaneous TID WC   insulin glargine-yfgn  12 Units Subcutaneous QHS   multivitamin  1 tablet Oral QHS   pantoprazole  80 mg Oral Daily   sevelamer carbonate  1,600 mg Oral TID WC   sodium chloride flush  3 mL Intravenous Q12H   Continuous Infusions:  sodium chloride     sodium chloride Stopped (08/29/21 0139)   sodium chloride     DAPTOmycin (CUBICIN) 900 mg in sodium chloride 0.9 % IVPB 900 mg (09/13/21 2145)   methocarbamol (ROBAXIN) IV       LOS: 21 days     Dessa Phi, DO [facility] 09/16/2021, 12:51 PM   Available via Epic secure chat 7am-7pm After these hours, please refer to coverage provider listed on [email or web address]

## 2021-09-16 NOTE — Progress Notes (Signed)
Pt's med taken to pharmacy.

## 2021-09-16 NOTE — Progress Notes (Addendum)
Received patient in bed, alert and oriented. Informed consent signed and in chart. Pt refused HD in chair.  Time tx completed: 4 hours 15 minutes  HD treatment completed. Patient tolerated well. Fistula/Graft without signs and symptoms of complications. Patient transported back to the room, alert and orient and in no acute distress. Report given to bedside RN.  Total UF removed: 4.5 liters  Medication given: none  Post HD VS: BP 137/70 MAP 90 HR 72 RR 16 Sat 100% on 2 liters nasal cannula  Post HD weight: 96.7 kg

## 2021-09-16 NOTE — Progress Notes (Signed)
   Inpatient Rehab Admissions Coordinator :  Per therapy recommendations, patient was screened for CIR candidacy by Danne Baxter RN MSN.  At this time patient appears to be a potential candidate for CIR. I will place a rehab consult per protocol for full assessment.  Noted patient not sitting in hemodialysis chair as requested to prepare her for discharge home to outpatient dialysis and declines SNF placement. Was previously at [facility] for 29 days,Please call me with any questions.  Danne Baxter RN MSN Admissions Coordinator [phone]

## 2021-09-16 NOTE — Progress Notes (Signed)
Pharmacy Antibiotic Note  [name] is a 56 y.o. 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 Prior intra-op cultures in May grew MSSA, now with intra-op cultures this admission growing Enterococcus faecium (VRE). Pharmacy has been consulted for Daptomycin dosing.  Noted pt is ESRD and her daptomycin is given after dialysis on MWF.  Monitoring weekly CK remains within normal limits last checked on 7/14, next due 09/20/21.   WBC is within normal limits. Afebrile.  6/30 CK 21 7/7 CK 23 7/14 CK 39   Plan: - Continue Daptomycin 900 mg (10 mg/kg TBW) IV every MWF after HD, anticipated stop date 10/12/21 per ID - Continue with weekly CK monitoring on Fridays - Will continue to follow HD schedule/duration, LOT, and antibiotic de-escalation plans   Height: 5' 10.98" (180.3 cm) Weight: 100.4 kg (221 lb 5.5 oz) IBW/kg (Calculated) : 70.76  Temp (24hrs), Avg:98.3 F (36.8 C), Min:97.9 F (36.6 C), Max:98.8 F (37.1 C)  Recent Labs  Lab 09/10/21 0334 09/11/21 0323 09/13/21 0549 09/16/21 0240  WBC  --  9.3 8.1 7.9  CREATININE 4.03* 4.74* 4.46* 5.50*     Estimated Creatinine Clearance: 15.2 mL/min (A) (by C-G formula based on SCr of 5.5 mg/dL (H)).    Allergies  Allergen Reactions   Trazodone Swelling   Latex Rash   Lidocaine Itching   Nicole Cella, RPh Clinical Pharmacist Clinical phone for 09/16/2021 from 7:30-3:00 is [phone].  **Pharmacist phone directory can be found on [email or web address] listed under [place].  09/16/2021 2:22 PM

## 2021-09-17 DIAGNOSIS — M86252 Subacute osteomyelitis, left femur: Secondary | ICD-10-CM | POA: Diagnosis not present

## 2021-09-17 LAB — GLUCOSE, CAPILLARY
Glucose-Capillary: 170 mg/dL — ABNORMAL HIGH (ref 70–99)
Glucose-Capillary: 128 mg/dL — ABNORMAL HIGH (ref 70–99)
Glucose-Capillary: 155 mg/dL — ABNORMAL HIGH (ref 70–99)
Glucose-Capillary: 99 mg/dL (ref 70–99)

## 2021-09-17 MED ORDER — CHLORHEXIDINE GLUCONATE CLOTH 2 % EX PADS: 6.0000 | MEDICATED_PAD | Freq: Every day | CUTANEOUS | Status: DC

## 2021-09-17 MED ORDER — ALTEPLASE 2 MG IJ SOLR
2.0000 mg | Freq: Once | INTRAMUSCULAR | Status: DC | PRN
Start: 2021-09-17 — End: 2021-09-18

## 2021-09-17 MED ORDER — PENTAFLUOROPROP-TETRAFLUOROETH EX AERO: 1.0000 | INHALATION_SPRAY | CUTANEOUS | Status: DC | PRN

## 2021-09-17 MED ORDER — HEPARIN SODIUM (PORCINE) 1000 UNIT/ML DIALYSIS
20.0000 [IU]/kg | INTRAMUSCULAR | Status: DC | PRN
  Administered 2021-09-18: 1900 [IU] via INTRAVENOUS_CENTRAL
  Filled 2021-09-17: qty 2

## 2021-09-17 MED ORDER — SODIUM CHLORIDE 0.9 % IV SOLN
125.0000 mg | INTRAVENOUS | Status: DC
Start: 2021-09-18 — End: 2021-09-17

## 2021-09-17 MED ORDER — FUROSEMIDE 40 MG PO TABS
80.0000 mg | ORAL_TABLET | Freq: Every day | ORAL | Status: DC
  Administered 2021-09-17 – 2022-03-18 (×178): 80 mg via ORAL
  Filled 2021-09-17 (×180): qty 2

## 2021-09-17 MED ORDER — HEPARIN SODIUM (PORCINE) 1000 UNIT/ML DIALYSIS: 1000.0000 [IU] | INTRAMUSCULAR | Status: DC | PRN

## 2021-09-17 MED ORDER — LIDOCAINE HCL (PF) 1 % IJ SOLN
5.0000 mL | INTRAMUSCULAR | Status: DC | PRN
Start: 2021-09-17 — End: 2021-09-17

## 2021-09-17 NOTE — Progress Notes (Signed)
[facility] Progress Note   Subjective:    Seen and examined patient at bedside. Tolerated yesterday's HD with net UF 4.5L. She denies issues with clotting. Plan for HD 7/19 in chair.  Objective Vitals:   09/16/21 1734 09/16/21 2110 09/17/21 0520 09/17/21 0921  BP: (!) 122/54 (!) 129/54 (!) 136/59 (!) 142/54  Pulse: 71 62 65 70  Resp: 16 18 16 18   Temp: 98.7 F (37.1 C) 99.1 F (37.3 C) 98 F (36.7 C) 98.4 F (36.9 C)  TempSrc:   Oral Oral  SpO2: 100% 98% 97% 98%  Weight:      Height:       Physical Exam General: Chronically ill-appearing; on RA; NAD Heart: S1 and S2; No murmurs, gallops, or rubs Lungs: Mildly diminished LLB, otherwise clear; No wheezing, rales, or rhonchi  Abdomen: Soft and non-tender Extremities: R Thigh 1+ edema and compression hose, L BKA trace edema, left hip wound VAC Dialysis Access: L AVF (+) B/T   Filed Weights   09/13/21 1838 09/16/21 0955 09/16/21 1448  Weight: 95.7 kg 100.4 kg 96.7 kg    Intake/Output Summary (Last 24 hours) at 09/17/2021 1003 Last data filed at 09/17/2021 0700 Gross per 24 hour  Intake 1205 ml  Output 4935 ml  Net -3730 ml    Additional Objective Labs: Basic Metabolic Panel: Recent Labs  Lab 09/11/21 0323 09/13/21 0549 09/16/21 0240  NA 124* 125* 124*  K 4.5 4.7 5.3*  CL 91* 90* 89*  CO2 25 23 24   GLUCOSE 160* 118* 133*  BUN 39* 37* 45*  CREATININE 4.74* 4.46* 5.50*  CALCIUM 8.2* 8.3* 8.3*  PHOS 4.4 5.3*  --    Liver Function Tests: Recent Labs  Lab 09/11/21 0323  ALBUMIN 2.4*   No results for input(s): "LIPASE", "AMYLASE" in the last 168 hours. CBC: Recent Labs  Lab 09/11/21 0323 09/13/21 0549 09/16/21 0240  WBC 9.3 8.1 7.9  HGB 8.0* 8.4* 8.5*  HCT 24.7* 26.0* 26.5*  MCV 94.3 93.2 92.7  PLT 225 241 244   Blood Culture    Component Value Date/Time   SDES TISSUE 08/28/2021 0917   SPECREQUEST LEFT THIGH TISSUE 08/28/2021 0917   CULT  08/28/2021 0917    MODERATE  ENTEROCOCCUS FAECIUM VANCOMYCIN RESISTANT ENTEROCOCCUS ISOLATED NO ANAEROBES ISOLATED Sent to [facility] for further susceptibility testing. Performed at [facility], [place] [address], [place], [place] [address]    REPTSTATUS 09/02/2021 FINAL 08/28/2021 0917    Cardiac Enzymes: Recent Labs  Lab 09/13/21 0549  CKTOTAL 39   CBG: Recent Labs  Lab 09/15/21 2110 09/16/21 0723 09/16/21 1645 09/16/21 2112 09/17/21 0717  GLUCAP 161* 114* 129* 169* 170*   Iron Studies:  Recent Labs    09/16/21 1030  IRON 50  TIBC 225*  FERRITIN 532*   Lab Results  Component Value Date   INR 1.1 12/28/2019   INR 1.2 12/17/2018   INR 0.99 04/07/2017   Studies/Results: No results found.  Medications:  sodium chloride     sodium chloride Stopped (08/29/21 0139)   sodium chloride     DAPTOmycin (CUBICIN) 900 mg in sodium chloride 0.9 % IVPB 900 mg (09/16/21 2020)   methocarbamol (ROBAXIN) IV      alosetron  1 mg Oral BID   amitriptyline  25 mg Oral QHS   amLODipine  5 mg Oral Daily   atorvastatin  40 mg Oral q1800   carvedilol  6.25 mg Oral BID   Chlorhexidine Gluconate Cloth  6 each Topical Q0600   Chlorhexidine Gluconate Cloth  6 each Topical Q0600   cyclobenzaprine  10 mg Oral TID   darbepoetin (ARANESP) injection - DIALYSIS  200 mcg Intravenous Q Fri-HD   docusate sodium  100 mg Oral BID   DULoxetine  60 mg Oral Daily   furosemide  80 mg Oral Daily   heparin  5,000 Units Subcutaneous Q8H   hydrALAZINE  50 mg Oral TID   insulin aspart  0-6 Units Subcutaneous TID WC   insulin aspart  5 Units Subcutaneous TID WC   insulin glargine-yfgn  12 Units Subcutaneous QHS   multivitamin  1 tablet Oral QHS   pantoprazole  80 mg Oral Daily   sevelamer carbonate  1,600 mg Oral TID WC   sodium chloride flush  3 mL Intravenous Q12H    Dialysis Orders: [facility] MWF 4h  400/500  90kg  2/2 bath  P2  Heparin 4000 units IV TIW  LUA AVF  Assessment/Plan:  L hip infection: S/p 08/10/21 surgery  -> persistent infec VRE Cx > OR 08/28/2021 +08/31/21 for L hip debridement, wound vac .on Daptomycin/" ID recommends continuing Daptomycin 6 weeks on HD stop 10/12/21 // f/u with Dr. Candiss Norse on 7/20". ESRD - on HD MWF. Serial HD 06/28, 6/29 ,6/30. Marland Kitchen Vol . NA now 124; K 5.3. Follow trends. Started back use Heparin  7/14 with HD d/t machine clotting off. Continue HD 4 hours 15 minutes to help facilitate UF and recliner hemodialysis Access - Difficult cannulation on 7/07 but no issues currently, continue to monitor. HTN/ volume: BP high admit but now more improved. Weights still up (6kg). Net UF 4.5 Last hd . Bed wts. Not Accurate . On amlodipine 5mg  q d and carved. 6.25mg  bid /may need to taper down BP meds. Will attempt 4-5 L UF tomorrow. Anemia of ESRD- Hgb now 8.5. Hgb declined post op - 2U PRBCs given 6/29. Was  Holding heparin with HD BUT with HD TXs clotting, start back using Hep on 7/14. Continue Aranesp 200 mcg. Monitor trend. Noted Tsat 22%, Iron 50, and Ferritin 532-will order short Fe load. Secondary HPTH: CCa in range, PO4  5.3  Cont binders.  DM2: Insulin per primary Dispo: Must dialyze in chair   Tobie Poet, NP [facility] 09/17/2021,10:03 AM  LOS: 22 days

## 2021-09-17 NOTE — Progress Notes (Signed)
Inpatient Rehab Admissions Coordinator:    CIR re-consulted on this Pt. At this time, pt. Continues to demonstrate inconsistent participation with therapies and refuses to sit in chair for HD. Pt. State she would like to come to CIR; however, in order for me to bring Pt. To CIR, she will need to be consistently participating in PT/OT, getting up to the chair during they day, and tolerating HD in the chair. Additionally, we will need to at a level where I believe mod I goals are achievable in a short time frame,  (~min A with transfers, gait, ADLS) as her daughter will not be home with her during the day.   Clemens Catholic, [place], [place] Admissions Coordinator  [phone] ([place]) [phone] (office)

## 2021-09-17 NOTE — Plan of Care (Signed)
  Problem: Education: Goal: Knowledge of disease and its progression will improve Outcome: Completed/Met

## 2021-09-17 NOTE — TOC Progression Note (Signed)
Transition of Care [facility]) - Initial/Assessment Note    Patient Details  Name: [name] MRN: [id number] Date of Birth: 01-23-1966  Transition of Care [facility]) CM/SW Contact:    Milinda Antis, [place] Phone Number: 09/17/2021, 10:01 AM  Clinical Narrative:                 Patient continues to decline sitting to receive HD and refusing SNF.  CIR is now following.  TOC will continue to follow for d/c needs.    Expected Discharge Plan: Skilled Nursing Facility Barriers to Discharge: Continued Medical Work up   Patient Goals and CMS Choice Patient states their goals for this hospitalization and ongoing recovery are:: To return home CMS Medicare.gov Compare Post Acute Care list provided to:: Patient Choice offered to / list presented to : NA  Expected Discharge Plan and Services Expected Discharge Plan: [place]   Discharge Planning Services: CM Consult   Living arrangements for the past 2 months: Single Family Home                                      Prior Living Arrangements/Services Living arrangements for the past 2 months: Single Family Home Lives with:: Adult Children Patient language and need for interpreter reviewed:: Yes Do you feel safe going back to the place where you live?: Yes      Need for Family Participation in Patient Care: Yes (Comment) Care giver support system in place?: Yes (comment) Current home services: DME ([employer], w/c) Criminal Activity/Legal Involvement Pertinent to Current Situation/Hospitalization: No - Comment as needed  Activities of Daily Living Home Assistive Devices/Equipment: Eyeglasses, Wheelchair ADL Screening (condition at time of admission) Patient's cognitive ability adequate to safely complete daily activities?: Yes Is the patient deaf or have difficulty hearing?: No Does the patient have difficulty seeing, even when wearing glasses/contacts?: No Does the patient have difficulty concentrating,  remembering, or making decisions?: No Patient able to express need for assistance with ADLs?: Yes Does the patient have difficulty dressing or bathing?: Yes Independently performs ADLs?: No Communication: Independent Dressing (OT): Independent Is this a change from baseline?: Pre-admission baseline Grooming: Independent Feeding: Independent Bathing: Needs assistance Is this a change from baseline?: Pre-admission baseline Toileting: Needs assistance Is this a change from baseline?: Pre-admission baseline In/Out Bed: Needs assistance, Dependent Is this a change from baseline?: Pre-admission baseline Walks in Home: Dependent Is this a change from baseline?: Pre-admission baseline Does the patient have difficulty walking or climbing stairs?: Yes Weakness of Legs: Both Weakness of Arms/Hands: Both  Permission Sought/Granted Permission sought to share information with : Case Manager, Family Supports Permission granted to share information with : Yes, Verbal Permission Granted              Emotional Assessment Appearance:: Appears stated age Attitude/Demeanor/Rapport: Engaged, Gracious Affect (typically observed): Accepting, Appropriate, Calm, Hopeful Orientation: : Oriented to Self, Oriented to Place, Oriented to  Time, Oriented to Situation Alcohol / Substance Use: Not Applicable Psych Involvement: No (comment)  Admission diagnosis:  Wound infection [T14.8XXA, L08.9] Abscess of left hip [L02.416] Patient Active Problem List   Diagnosis Date Noted   Left femoral shaft fracture ([place]) 09/05/2021   Wound dehiscence, surgical, sequela    Abscess of left hip 08/26/2021   History of CVA (cerebrovascular accident) 01/75/1025   Hardware complicating wound infection ([place])    Subacute osteomyelitis of left femur with abscess  Septic arthritis of hip ([place]) 07/30/2021   Skin abscess L hip 07/28/2021   HCAP (healthcare-associated pneumonia) 07/27/2021   Hyperkalemia 18/84/1660    Metabolic acidemia 63/03/6008   Closed fracture of left distal femur ([place]) 07/22/2021   Uncontrolled type 2 diabetes mellitus with hypoglycemia, with long-term current use of insulin ([place]) 07/22/2021   Closed intertrochanteric fracture of hip, left, sequela 07/21/2021   Infection of superficial incisional surgical site after procedure 03/09/2020   Iron deficiency anemia, unspecified 02/21/2020   Abscess    Hypoglycemia    Benign essential HTN    Sleep disturbance    ESRD (end stage renal disease) on dialysis ([place]) 01/24/2020   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[place]) 01/23/2020   Hyponatremia    Constipation    Chronic osteomyelitis involving left ankle and foot ([place])    Ulcer of left foot with necrosis of bone ([place])    Wound infection 12/28/2019   History of [place] amputation of left foot ([place]) 12/25/2017   Dyslipidemia, goal LDL below 100 07/04/2017   History of CVA (cerebrovascular accident) without residual deficits 07/04/2017   Cerebral thrombosis with cerebral infarction 04/08/2017   Right sided weakness 04/07/2017   Hyperhidrosis 09/01/2016   Migraine with aura and without status migrainosus, not intractable 07/28/2016   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[place]) 08/04/2014   CKD stage 3 due to type 2 diabetes mellitus ([place]) 06/27/2014   Vitamin D insufficiency 05/08/2014   Obesity (BMI 30.0-34.9) 05/08/2014   Unilateral amputation of left foot ([place]) 05/08/2014   Bursitis of left shoulder 02/14/2014   Neck pain 01/03/2014   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.23) 01/14/2013   Insomnia 08/04/2012   Diabetic neuropathy, painful ([place]) 08/04/2011   Anxiety and depression 05/16/2010   Female stress incontinence 11/01/2007   TOBACCO DEPENDENCE 04/30/2006   GASTROESOPHAGEAL REFLUX, NO ESOPHAGITIS 04/30/2006   Irritable bowel syndrome 04/30/2006   PCP:  [name], MD Pharmacy:   [place], [place] [address] [place] [address] Phone: [phone] Fax: [phone]  [id number] [place], [place] [address] [place] [address] Phone: [phone] Fax: [phone]     Social Determinants of Health (SDOH) Interventions    Readmission Risk Interventions    07/26/2021    3:15 PM  Readmission Risk Prevention Plan  Transportation Screening Complete  PCP or Specialist Appt within 3-5 Days Complete  HRI or Home Care Consult Complete  Palliative Care Screening Not Applicable  Medication Review (RN Care Manager) Referral to Pharmacy

## 2021-09-17 NOTE — Progress Notes (Signed)
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 CT scan revealed ununited left inter-trochanteric fracture, soft tissue wound overlying the greater trochanter with air extending from the skin surface to the joint space concerning for infection. Dr. [name] was consulted and the patient underwent an I&D on 6/28 and again on 7/1. Cultures have grown out VRE and the patient is receiving daptomycin per ID.   Unable to dc as she declines to sit up in a chair despite numerous encouragement and needs to be able to do so for outpt dialysis. Now under evaluation for potential CIR admission  New events last 24 hours / Subjective: Still continues to decline to get into dialysis chair.  This is the main issue with discharge.  She has no physical complaints, wants her wound VAC to be reevaluated as it was leaking this morning.  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  Active Problems:   Closed intertrochanteric fracture of hip, left, sequela   ESRD (end stage renal disease) on dialysis ([place])   Uncontrolled type 2 diabetes mellitus with hypoglycemia, with long-term current use of insulin (HCC)   History of CVA (cerebrovascular accident)   Benign essential HTN   Dyslipidemia, goal LDL  below 100   Anxiety and depression   GASTROESOPHAGEAL REFLUX, NO ESOPHAGITIS   TOBACCO DEPENDENCE   Wound infection   Iron deficiency anemia, unspecified   Wound dehiscence, surgical, sequela   Left femoral shaft fracture (HCC)   Subcutaneous osteomyelitis of the left femur with abscess, infected left hip hardware status post removal, left intertrochanteric fracture -Wound VAC to the left outer thigh -Infectious disease recommending daptomycin for 6 weeks with dialysis, stop date 8/12.  Follow-up with Dr. Candiss Norse   ESRD -Dialysis MWF.  Patient needs to be able to sit in chair to tolerate outpatient dialysis for discharge -Nephrology following  Anemia of chronic disease, component of acute blood loss anemia from wound -Status post 2 unit packed red blood cells 6/29 -Hemoglobin stable   Uncontrolled type 2 diabetes with hypoglycemia -A1c 7.1 -Semglee, NovoLog, sliding scale  Hypertension -Amlodipine, Coreg, hydralazine  Hyperlipidemia -Lipitor  DVT prophylaxis:  Place TED hose Start: 09/06/21 1507 SCDs Start: 08/31/21 1058 SCDs Start: 08/28/21 1049 heparin injection 5,000 Units Start: 08/26/21 1615  Code Status: Full code Family Communication: No family at bedside Disposition Plan:  Status is: Inpatient Remains inpatient appropriate because: Patient needs to continue to work on sitting in chair for dialysis.  She is declining SNF placement.  Potential CIR evaluation   Antimicrobials:  Anti-infectives (From admission, onward)    Start     Dose/Rate Route Frequency Ordered Stop   09/02/21 2000  DAPTOmycin (CUBICIN) 900 mg in sodium chloride 0.9 % IVPB        10 mg/kg  92.4 kg 136 mL/hr over 30 Minutes Intravenous Once per  day on Mon Wed Fri 09/02/21 0836 10/12/21 2359   08/31/21 0745  ceFAZolin (ANCEF) IVPB 2g/100 mL premix        2 g 200 mL/hr over 30 Minutes Intravenous To Short Stay 08/31/21 0730 08/31/21 0918   08/30/21 0600  ceFAZolin (ANCEF) IVPB 2g/100 mL  premix  Status:  Discontinued        2 g 200 mL/hr over 30 Minutes Intravenous To Short Stay 08/30/21 0123 08/31/21 0600   08/29/21 2000  DAPTOmycin (CUBICIN) 900 mg in sodium chloride 0.9 % IVPB  Status:  Discontinued        10 mg/kg  92.4 kg 136 mL/hr over 30 Minutes Intravenous Every 48 hours 08/29/21 1627 09/02/21 0836   08/28/21 1800  ceFAZolin (ANCEF) IVPB 2g/100 mL premix  Status:  Discontinued        2 g 200 mL/hr over 30 Minutes Intravenous Every M-W-F (1800) 08/27/21 0846 08/29/21 1620   08/28/21 1200  ceFAZolin (ANCEF) IVPB 1 g/50 mL premix  Status:  Discontinued        1 g 100 mL/hr over 30 Minutes Intravenous Every M-W-F (Hemodialysis) 08/26/21 1954 08/27/21 0846   08/28/21 0600  ceFAZolin (ANCEF) IVPB 2g/100 mL premix        2 g 200 mL/hr over 30 Minutes Intravenous On call to O.R. 08/27/21 1150 08/28/21 0909   08/27/21 1800  ceFAZolin (ANCEF) IVPB 1 g/50 mL premix        1 g 100 mL/hr over 30 Minutes Intravenous  Once 08/26/21 1954 08/27/21 2048   08/26/21 1500  ceFEPIme (MAXIPIME) 1 g in sodium chloride 0.9 % 100 mL IVPB  Status:  Discontinued        1 g 200 mL/hr over 30 Minutes Intravenous Every 24 hours 08/26/21 1452 08/26/21 1714        Objective: Vitals:   09/16/21 1734 09/16/21 2110 09/17/21 0520 09/17/21 0921  BP: (!) 122/54 (!) 129/54 (!) 136/59 (!) 142/54  Pulse: 71 62 65 70  Resp: 16 18 16 18   Temp: 98.7 F (37.1 C) 99.1 F (37.3 C) 98 F (36.7 C) 98.4 F (36.9 C)  TempSrc:   Oral Oral  SpO2: 100% 98% 97% 98%  Weight:      Height:        Intake/Output Summary (Last 24 hours) at 09/17/2021 1046 Last data filed at 09/17/2021 0700 Gross per 24 hour  Intake 1205 ml  Output 4935 ml  Net -3730 ml    Filed Weights   09/13/21 1838 09/16/21 0955 09/16/21 1448  Weight: 95.7 kg 100.4 kg 96.7 kg    Examination:  General exam: Appears calm and comfortable  Respiratory system: Respiratory effort normal. No respiratory distress. No conversational  dyspnea.  Cardiovascular system: +dependent edema bilateral thighs  Central nervous system: Alert and oriented.  Speech clear.  Extremities: Left lower extremity status post BKA, wound VAC in place Psychiatry: Judgement and insight appear normal. Mood & affect appropriate.   Data Reviewed: I have personally reviewed following labs and imaging studies  CBC: Recent Labs  Lab 09/11/21 0323 09/13/21 0549 09/16/21 0240  WBC 9.3 8.1 7.9  HGB 8.0* 8.4* 8.5*  HCT 24.7* 26.0* 26.5*  MCV 94.3 93.2 92.7  PLT 225 241 093    Basic Metabolic Panel: Recent Labs  Lab 09/11/21 0323 09/13/21 0549 09/16/21 0240  NA 124* 125* 124*  K 4.5 4.7 5.3*  CL 91* 90* 89*  CO2 25 23 24   GLUCOSE 160* 118* 133*  BUN 39* 37* 45*  CREATININE 4.74* 4.46* 5.50*  CALCIUM 8.2* 8.3* 8.3*  PHOS 4.4 5.3*  --     GFR: Estimated Creatinine Clearance: 15 mL/min (A) (by C-G formula based on SCr of 5.5 mg/dL (H)). Liver Function Tests: Recent Labs  Lab 09/11/21 0323  ALBUMIN 2.4*    No results for input(s): "LIPASE", "AMYLASE" in the last 168 hours. No results for input(s): "AMMONIA" in the last 168 hours. Coagulation Profile: No results for input(s): "INR", "PROTIME" in the last 168 hours. Cardiac Enzymes: Recent Labs  Lab 09/13/21 0549  CKTOTAL 39    BNP (last 3 results) No results for input(s): "PROBNP" in the last 8760 hours. HbA1C: No results for input(s): "HGBA1C" in the last 72 hours. CBG: Recent Labs  Lab 09/15/21 2110 09/16/21 0723 09/16/21 1645 09/16/21 2112 09/17/21 0717  GLUCAP 161* 114* 129* 169* 170*    Lipid Profile: No results for input(s): "CHOL", "HDL", "LDLCALC", "TRIG", "CHOLHDL", "LDLDIRECT" in the last 72 hours. Thyroid Function Tests: No results for input(s): "TSH", "T4TOTAL", "FREET4", "T3FREE", "THYROIDAB" in the last 72 hours. Anemia Panel: Recent Labs    09/16/21 1030  FERRITIN 532*  TIBC 225*  IRON 50    Sepsis Labs: No results for input(s):  "PROCALCITON", "LATICACIDVEN" in the last 168 hours.  No results found for this or any previous visit (from the past 240 hour(s)).    Radiology Studies: No results found.    Scheduled Meds:  alosetron  1 mg Oral BID   amitriptyline  25 mg Oral QHS   amLODipine  5 mg Oral Daily   atorvastatin  40 mg Oral q1800   carvedilol  6.25 mg Oral BID   Chlorhexidine Gluconate Cloth  6 each Topical Q0600   Chlorhexidine Gluconate Cloth  6 each Topical Q0600   Chlorhexidine Gluconate Cloth  6 each Topical Q0600   cyclobenzaprine  10 mg Oral TID   darbepoetin (ARANESP) injection - DIALYSIS  200 mcg Intravenous Q Fri-HD   docusate sodium  100 mg Oral BID   DULoxetine  60 mg Oral Daily   furosemide  80 mg Oral Daily   heparin  5,000 Units Subcutaneous Q8H   hydrALAZINE  50 mg Oral TID   insulin aspart  0-6 Units Subcutaneous TID WC   insulin aspart  5 Units Subcutaneous TID WC   insulin glargine-yfgn  12 Units Subcutaneous QHS   multivitamin  1 tablet Oral QHS   pantoprazole  80 mg Oral Daily   sevelamer carbonate  1,600 mg Oral TID WC   sodium chloride flush  3 mL Intravenous Q12H   Continuous Infusions:  sodium chloride     sodium chloride Stopped (08/29/21 0139)   sodium chloride     DAPTOmycin (CUBICIN) 900 mg in sodium chloride 0.9 % IVPB 900 mg (09/16/21 2020)   [START ON 09/18/2021] ferric gluconate (FERRLECIT) IVPB     methocarbamol (ROBAXIN) IV       LOS: 22 days     Dessa Phi, DO [facility] 09/17/2021, 10:46 AM   Available via Epic secure chat 7am-7pm After these hours, please refer to coverage provider listed on [email or web address]

## 2021-09-17 NOTE — Progress Notes (Signed)
Physical Therapy Treatment Patient Details Name: [name] MRN: [id number] DOB: Jul 05, 1965 Today's Date: 09/17/2021   History of Present Illness 56 y.o. female presented to ED 6/26 from dialysis with increased bloody drainage from L hip wound. 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 s/p hardware removal of left hip with partial resection of tuft tissue and femure that were nonviable on 08/10/21 Discharged home 6/23. underwent L hip debridement and wound vac placement 6/28; +Left intertrochanteric non-union,  Fracture of left distal femur  PMH: hypertension, hyperlipidemia, ESRD on HD MWF, history of left BKA in 2021, depression/anxiety, stroke, tobacco use, T2DM,  insomnia, chronic pain syndrome,    PT Comments    Pt reports that she had drainage from her wound this morning and refuses any mobility until the surgeon assesses. PT visualized wound and wound vac and no drainage or leaks noted. Pt only agreeable to bed level exercises. Pt provided with HEP and PT reviewed exercises with her. Pt to do exercises 4x day every day. Pt has only participated in meaningful mobility level therapy 4 out of the last 10 sessions.  And pt refuses to work on sitting goal in recliner to be able to tolerate outpatient HD. Pt educated on need for building tolerance but pt refuses. Due to limited progression towards PT goals and knowledge of her bed level exercises. Reducing PT sessions to 1x/wk to review exercises and offer transfer training. Pt agreeable to this plan as she feels like she won't be able to participate fully until her L hip wound has healed. Due to lack of progress and inability to tolerate sitting PT recommending LTACH placement.      Recommendations for follow up therapy are one component of a multi-disciplinary discharge planning process, led by the attending physician.  Recommendations may be updated based on patient status, additional functional criteria and  insurance authorization.  Follow Up Recommendations  Long-term institutional care without follow-up therapy Can patient physically be transported by private vehicle: No   Assistance Recommended at Discharge Intermittent Supervision/Assistance  Patient can return home with the following A lot of help with walking and/or transfers;A lot of help with bathing/dressing/bathroom;Assistance with cooking/housework;Assist for transportation   Equipment Recommendations  Other (comment) (Seems well-equipped; agree with looking into medical van transport for HD)    Recommendations for Other Services OT consult     Precautions / Restrictions Precautions Precautions: Fall;Other (comment) Precaution Comments: L BKA (baseline), L LE wound vac Restrictions LLE Weight Bearing: Non weight bearing Other Position/Activity Restrictions: L distal femur fx--non union     Mobility  Bed Mobility               General bed mobility comments: refused due to visualization of drainage from hip wound early this morning.                               Cognition Arousal/Alertness: Awake/alert Behavior During Therapy: WFL for tasks assessed/performed Overall Cognitive Status: Within Functional Limits for tasks assessed                                 General Comments: Needs extra time to allow caregivers to truly listen to her        Exercises Total Joint Exercises Ankle Circles/Pumps: AROM, Right, 10 reps, Supine Quad Sets: AROM, Both, 10 reps, Supine Gluteal Sets: AROM,  Both, 10 reps, Supine Heel Slides: AROM, Both, 10 reps, Supine Hip ABduction/ADduction: AROM, Both, 10 reps, Supine Straight Leg Raises: AROM, Both, 10 reps, Supine General Exercises - Lower Extremity Short Arc Quad: AROM, Both, 10 reps, Supine    General Comments  VSS on RA, Wound vac in place no leakage noted.       Pertinent Vitals/Pain Pain Assessment Pain Assessment: Faces Faces Pain  Scale: Hurts little more Pain Location: L knee Pain Descriptors / Indicators: Discomfort, Sore Pain Intervention(s): Limited activity within patient's tolerance, Monitored during session     PT Goals (current goals can now be found in the care plan section) Acute Rehab PT Goals Patient Stated Goal: less pain with movement PT Goal Formulation: With patient Time For Goal Achievement: 09/26/21 Potential to Achieve Goals: Fair Progress towards PT goals: Not progressing toward goals - comment    Frequency    Min 1X/week      PT Plan Discharge plan needs to be updated       AM-PAC PT "6 Clicks" Mobility   Outcome Measure  Help needed turning from your back to your side while in a flat bed without using bedrails?: None Help needed moving from lying on your back to sitting on the side of a flat bed without using bedrails?: A Little Help needed moving to and from a bed to a chair (including a wheelchair)?: A Lot Help needed standing up from a chair using your arms (e.g., wheelchair or bedside chair)?: Total Help needed to walk in hospital room?: Total Help needed climbing 3-5 steps with a railing? : Total 6 Click Score: 12    End of Session   Activity Tolerance: Patient tolerated treatment well Patient left: in bed;with call bell/phone within reach;with bed alarm set Nurse Communication: Mobility status PT Visit Diagnosis: Other abnormalities of gait and mobility (R26.89);Muscle weakness (generalized) (M62.81);History of falling (Z91.81);Pain Pain - Right/Left: Left Pain - part of body: Leg     Time: 8185-6314 PT Time Calculation (min) (ACUTE ONLY): 13 min  Charges:  $Therapeutic Exercise: 8-22 mins                     [name]. [name] PT, DPT Acute Rehabilitation Services Please use secure chat or  Call Office [phone]    [place] 09/17/2021, 12:47 PM

## 2021-09-18 DIAGNOSIS — Z992 Dependence on renal dialysis: Secondary | ICD-10-CM | POA: Diagnosis not present

## 2021-09-18 DIAGNOSIS — E11649 Type 2 diabetes mellitus with hypoglycemia without coma: Secondary | ICD-10-CM

## 2021-09-18 DIAGNOSIS — M86252 Subacute osteomyelitis, left femur: Secondary | ICD-10-CM | POA: Diagnosis not present

## 2021-09-18 DIAGNOSIS — Z794 Long term (current) use of insulin: Secondary | ICD-10-CM

## 2021-09-18 DIAGNOSIS — N186 End stage renal disease: Secondary | ICD-10-CM | POA: Diagnosis not present

## 2021-09-18 LAB — CBC WITH DIFFERENTIAL/PLATELET
Abs Immature Granulocytes: 0.06 10*3/uL (ref 0.00–0.07)
Basophils Absolute: 0.1 10*3/uL (ref 0.0–0.1)
Basophils Relative: 1 %
Eosinophils Absolute: 0.4 10*3/uL (ref 0.0–0.5)
Eosinophils Relative: 5 %
HCT: 26.6 % — ABNORMAL LOW (ref 36.0–46.0)
Hemoglobin: 8.4 g/dL — ABNORMAL LOW (ref 12.0–15.0)
Immature Granulocytes: 1 %
Lymphocytes Relative: 28 %
Lymphs Abs: 2.1 10*3/uL (ref 0.7–4.0)
MCH: 29.7 pg (ref 26.0–34.0)
MCHC: 31.6 g/dL (ref 30.0–36.0)
MCV: 94 fL (ref 80.0–100.0)
Monocytes Absolute: 1.1 10*3/uL — ABNORMAL HIGH (ref 0.1–1.0)
Monocytes Relative: 15 %
Neutro Abs: 3.9 10*3/uL (ref 1.7–7.7)
Neutrophils Relative %: 50 %
Platelets: 240 10*3/uL (ref 150–400)
RBC: 2.83 MIL/uL — ABNORMAL LOW (ref 3.87–5.11)
RDW: 15.9 % — ABNORMAL HIGH (ref 11.5–15.5)
WBC: 7.7 10*3/uL (ref 4.0–10.5)
nRBC: 0 % (ref 0.0–0.2)

## 2021-09-18 LAB — RENAL FUNCTION PANEL
Albumin: 2.4 g/dL — ABNORMAL LOW (ref 3.5–5.0)
Anion gap: 8 (ref 5–15)
BUN: 32 mg/dL — ABNORMAL HIGH (ref 6–20)
CO2: 26 mmol/L (ref 22–32)
Calcium: 8.8 mg/dL — ABNORMAL LOW (ref 8.9–10.3)
Chloride: 92 mmol/L — ABNORMAL LOW (ref 98–111)
Creatinine, Ser: 4.62 mg/dL — ABNORMAL HIGH (ref 0.44–1.00)
GFR, Estimated: 11 mL/min — ABNORMAL LOW (ref >60)
Glucose, Bld: 97 mg/dL (ref 70–99)
Phosphorus: 4.2 mg/dL (ref 2.5–4.6)
Potassium: 4.8 mmol/L (ref 3.5–5.1)
Sodium: 126 mmol/L — ABNORMAL LOW (ref 135–145)

## 2021-09-18 LAB — GLUCOSE, CAPILLARY
Glucose-Capillary: 124 mg/dL — ABNORMAL HIGH (ref 70–99)
Glucose-Capillary: 152 mg/dL — ABNORMAL HIGH (ref 70–99)
Glucose-Capillary: 111 mg/dL — ABNORMAL HIGH (ref 70–99)
Glucose-Capillary: 152 mg/dL — ABNORMAL HIGH (ref 70–99)

## 2021-09-18 MED ORDER — ALTEPLASE 2 MG IJ SOLR: 2.0000 mg | Freq: Once | INTRAMUSCULAR | Status: DC | PRN

## 2021-09-18 MED ORDER — HEPARIN SODIUM (PORCINE) 1000 UNIT/ML DIALYSIS: 4000.0000 [IU] | INTRAMUSCULAR | Status: DC | PRN

## 2021-09-18 MED ORDER — LIDOCAINE HCL (PF) 1 % IJ SOLN
5.0000 mL | INTRAMUSCULAR | Status: DC | PRN
Start: 2021-09-18 — End: 2021-09-18

## 2021-09-18 MED ORDER — CHLORHEXIDINE GLUCONATE CLOTH 2 % EX PADS: 6.0000 | MEDICATED_PAD | Freq: Every day | CUTANEOUS | Status: DC

## 2021-09-18 MED ORDER — PENTAFLUOROPROP-TETRAFLUOROETH EX AERO: 1.0000 | INHALATION_SPRAY | CUTANEOUS | Status: DC | PRN

## 2021-09-18 MED ORDER — LIDOCAINE-PRILOCAINE 2.5-2.5 % EX CREA
1.0000 | TOPICAL_CREAM | CUTANEOUS | Status: DC | PRN
Start: 2021-09-18 — End: 2021-09-18

## 2021-09-18 MED ORDER — HEPARIN SODIUM (PORCINE) 1000 UNIT/ML DIALYSIS: 1000.0000 [IU] | INTRAMUSCULAR | Status: DC | PRN

## 2021-09-18 MED ORDER — ANTICOAGULANT SODIUM CITRATE 4% (200MG/5ML) IV SOLN
5.0000 mL | Status: DC | PRN
  Filled 2021-09-18: qty 5

## 2021-09-18 NOTE — Procedures (Signed)
I was present at this dialysis session. I have reviewed the session itself and made appropriate changes.   Filed Weights   09/13/21 1838 09/16/21 0955 09/16/21 1448  Weight: 95.7 kg 100.4 kg 96.7 kg    Recent Labs  Lab 09/18/21 0518  NA 126*  K 4.8  CL 92*  CO2 26  GLUCOSE 97  BUN 32*  CREATININE 4.62*  CALCIUM 8.8*  PHOS 4.2    Recent Labs  Lab 09/13/21 0549 09/16/21 0240 09/18/21 0518  WBC 8.1 7.9 7.7  NEUTROABS  --   --  3.9  HGB 8.4* 8.5* 8.4*  HCT 26.0* 26.5* 26.6*  MCV 93.2 92.7 94.0  PLT 241 244 240    Scheduled Meds:  alosetron  1 mg Oral BID   amitriptyline  25 mg Oral QHS   amLODipine  5 mg Oral Daily   atorvastatin  40 mg Oral q1800   carvedilol  6.25 mg Oral BID   cyclobenzaprine  10 mg Oral TID   darbepoetin (ARANESP) injection - DIALYSIS  200 mcg Intravenous Q Fri-HD   docusate sodium  100 mg Oral BID   DULoxetine  60 mg Oral Daily   furosemide  80 mg Oral Daily   heparin  5,000 Units Subcutaneous Q8H   hydrALAZINE  50 mg Oral TID   insulin aspart  0-6 Units Subcutaneous TID WC   insulin aspart  5 Units Subcutaneous TID WC   insulin glargine-yfgn  12 Units Subcutaneous QHS   multivitamin  1 tablet Oral QHS   pantoprazole  80 mg Oral Daily   sevelamer carbonate  1,600 mg Oral TID WC   sodium chloride flush  3 mL Intravenous Q12H   Continuous Infusions:  sodium chloride     sodium chloride Stopped (08/29/21 0139)   sodium chloride     anticoagulant sodium citrate     DAPTOmycin (CUBICIN) 900 mg in sodium chloride 0.9 % IVPB 900 mg (09/16/21 2020)   methocarbamol (ROBAXIN) IV     PRN Meds:.sodium chloride, acetaminophen, alteplase, alteplase, anticoagulant sodium citrate, bisacodyl, calcium carbonate, heparin, heparin, heparin, heparin, methocarbamol **OR** methocarbamol (ROBAXIN) IV, ondansetron **OR** ondansetron (ZOFRAN) IV, mouth rinse, oxyCODONE, oxyCODONE, pentafluoroprop-tetrafluoroeth, pentafluoroprop-tetrafluoroeth, polyethylene  glycol, prochlorperazine, sodium chloride flush   Santiago Bumpers,  MD 09/18/2021, 8:30 AM

## 2021-09-18 NOTE — Progress Notes (Addendum)
[facility] Progress Note   Subjective:    Seen and examined patient on HD. Informed by HD RN of clots coming from AVF with high arterial pressures. Unable to complete HD today. Only removed 763ml. VVS was previously consulted (7/723) d/t cannulation issues.   Objective Vitals:   09/18/21 0930 09/18/21 1000 09/18/21 1051 09/18/21 1054  BP: (!) 132/57 (!) 145/87  (!) 143/55  Pulse:  68  66  Resp:  16  17  Temp:  98.1 F (36.7 C) 98.3 F (36.8 C)   TempSrc:  Oral Oral   SpO2:    100%  Weight:  101.8 kg    Height:       Physical Exam General: Chronically ill-appearing; on RA; NAD Heart: S1 and S2; No murmurs, gallops, or rubs Lungs: Mildly diminished LLB, otherwise clear; No wheezing, rales, or rhonchi  Abdomen: Soft and non-tender Extremities: R Thigh 1+ edema and compression hose, L BKA trace edema, left hip wound VAC Dialysis Access: L AVF (+) B/T   Filed Weights   09/16/21 1448 09/18/21 0837 09/18/21 1000  Weight: 96.7 kg 102.7 kg 101.8 kg    Intake/Output Summary (Last 24 hours) at 09/18/2021 1145 Last data filed at 09/18/2021 1000 Gross per 24 hour  Intake 440 ml  Output 450.7 ml  Net -10.7 ml    Additional Objective Labs: Basic Metabolic Panel: Recent Labs  Lab 09/13/21 0549 09/16/21 0240 09/18/21 0518  NA 125* 124* 126*  K 4.7 5.3* 4.8  CL 90* 89* 92*  CO2 23 24 26   GLUCOSE 118* 133* 97  BUN 37* 45* 32*  CREATININE 4.46* 5.50* 4.62*  CALCIUM 8.3* 8.3* 8.8*  PHOS 5.3*  --  4.2   Liver Function Tests: Recent Labs  Lab 09/18/21 0518  ALBUMIN 2.4*   No results for input(s): "LIPASE", "AMYLASE" in the last 168 hours. CBC: Recent Labs  Lab 09/13/21 0549 09/16/21 0240 09/18/21 0518  WBC 8.1 7.9 7.7  NEUTROABS  --   --  3.9  HGB 8.4* 8.5* 8.4*  HCT 26.0* 26.5* 26.6*  MCV 93.2 92.7 94.0  PLT 241 244 240   Blood Culture    Component Value Date/Time   SDES TISSUE 08/28/2021 0917   SPECREQUEST LEFT THIGH TISSUE 08/28/2021 0917    CULT  08/28/2021 0917    MODERATE ENTEROCOCCUS FAECIUM VANCOMYCIN RESISTANT ENTEROCOCCUS ISOLATED NO ANAEROBES ISOLATED Sent to [place] for further susceptibility testing. Performed at [facility], [place] [address], [place], [place] [address]    REPTSTATUS 09/02/2021 FINAL 08/28/2021 0917    Cardiac Enzymes: Recent Labs  Lab 09/13/21 0549  CKTOTAL 39   CBG: Recent Labs  Lab 09/17/21 1136 09/17/21 1659 09/17/21 2118 09/18/21 0737 09/18/21 1128  GLUCAP 128* 155* 99 124* 152*   Iron Studies:  Recent Labs    09/16/21 1030  IRON 50  TIBC 225*  FERRITIN 532*   Lab Results  Component Value Date   INR 1.1 12/28/2019   INR 1.2 12/17/2018   INR 0.99 04/07/2017   Studies/Results: No results found.  Medications:  sodium chloride     sodium chloride Stopped (08/29/21 0139)   sodium chloride     DAPTOmycin (CUBICIN) 900 mg in sodium chloride 0.9 % IVPB 900 mg (09/16/21 2020)   methocarbamol (ROBAXIN) IV      alosetron  1 mg Oral BID   amitriptyline  25 mg Oral QHS   amLODipine  5 mg Oral Daily   atorvastatin  40 mg  Oral q1800   carvedilol  6.25 mg Oral BID   cyclobenzaprine  10 mg Oral TID   darbepoetin (ARANESP) injection - DIALYSIS  200 mcg Intravenous Q Fri-HD   docusate sodium  100 mg Oral BID   DULoxetine  60 mg Oral Daily   furosemide  80 mg Oral Daily   heparin  5,000 Units Subcutaneous Q8H   hydrALAZINE  50 mg Oral TID   insulin aspart  0-6 Units Subcutaneous TID WC   insulin aspart  5 Units Subcutaneous TID WC   insulin glargine-yfgn  12 Units Subcutaneous QHS   multivitamin  1 tablet Oral QHS   pantoprazole  80 mg Oral Daily   sevelamer carbonate  1,600 mg Oral TID WC   sodium chloride flush  3 mL Intravenous Q12H    Dialysis Orders: [facility] MWF 4h  400/500  90kg  2/2 bath  P2  Heparin 4000 units IV TIW  LUA AVF  Assessment/Plan:  L hip infection: S/p 08/10/21 surgery -> persistent infec VRE Cx > OR 08/28/2021 +08/31/21 for L hip debridement,  wound vac .on Daptomycin/" ID recommends continuing Daptomycin 6 weeks on HD stop 10/12/21 // f/u with Dr. Candiss Norse on 7/20". ESRD - on HD MWF. Serial HD 06/28, 6/29 ,6/30. Marland Kitchen Vol . NA now 124; K 5.3. Follow trends. Started back use Heparin  7/14 with HD d/t machine clotting off. Continue HD 4 hours 15 minutes to help facilitate UF and recliner hemodialysis. Unable to complete HD today d/t malfunction AVF-re-consulted VVS. Access - Difficult cannulation on 7/07. Now informed by HD RN today of noted clots from AVF and high arterial pressures. Unable to complete HD today. Re-consulted VVS. S/p Fistulogram 6/22 by Dr. Stanford Breed. S/p fistula duplex 7/8. Both Fistulogram and duplex were negative. Discussed with HD Charge RN-been having issues with her access for couple weeks and also ensured experienced staff have been cannulating patient. Discussed case with Dr. Carlis Abbott today. Will try to re-stick her this evening. If ongoing issues still occur, more likely will need to proceed with intervention. Per VVS, decide between ligation vs placing new vascular access altogether. HTN/ volume: BP high admit but now more improved. Weights still up (6kg). Net UF 4.5 Last hd . Bed wts. Not Accurate . On amlodipine 5mg  q d and carved. 6.25mg  bid /may need to taper down BP meds. Push UF as tolerated with HD. Anemia of ESRD- Hgb now 8.4. Hgb declined post op - 2U PRBCs given 6/29. Was  Holding heparin with HD BUT with HD TXs clotting, start back using Hep on 7/14. Noted Tsat 22%, Iron 50, and Ferritin 532-cannot give Fe d/t current infection and on ABXs. Continue Aranesp 200 mcg and monitor trend. Secondary HPTH: CCa and PO4 in range. Cont binders.  DM2: Insulin per primary Dispo: Must dialyze in chair  Tobie Poet, NP [place] 09/18/2021,11:45 AM  LOS: 23 days

## 2021-09-18 NOTE — Plan of Care (Signed)
  Problem: Health Behavior/Discharge Planning: Goal: Ability to manage health-related needs will improve Outcome: Progressing   Problem: Clinical Measurements: Goal: Will remain free from infection Outcome: Progressing   Problem: Pain Managment: Goal: General experience of comfort will improve Outcome: Progressing   

## 2021-09-18 NOTE — Progress Notes (Signed)
OT Cancellation Note  Patient Details Name: [name] MRN: [id number] DOB: Oct 03, 1965   Cancelled Treatment:    Reason Eval/Treat Not Completed: Patient at procedure or test/ unavailable (Pt in HD and historically does not work with therapy on dialysis days.)  [name] 09/18/2021, 9:24 AM Cleta Alberts, OTR/L [place] Office: [phone]

## 2021-09-18 NOTE — Progress Notes (Signed)
Inpatient Rehab Admissions Coordinator:   I attempted to reach out to Pt.'s daughter to confirm what support she could provide after rehab (if Pt. Ends up coming) and left a VM. Pt. Is not medically ready or participating well enough to be a CIR candidate at this time but I will continue to follow for progress and participation with therapies.   Clemens Catholic, [place], [place] Admissions Coordinator  [phone] ([place]) [phone] (office)

## 2021-09-18 NOTE — Progress Notes (Signed)
  Progress Note   Patient: [name] DZH:[id number] DOB: 1966/01/15 DOA: 08/26/2021     23 DOS: the patient was seen and examined on 09/18/2021   Brief hospital course: 56 year old woman PMH including ESRD, complicated left hip and femur issues requiring multiple surgeries, removal of hardware presented 6/26 with bloody drainage from surgical incision.  Seen by orthopedics and underwent I&D 6/28 and 7/1.  Currently unable to progress care secondary to refusal to sit in chair for HD.  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  Follow-up with Dr. Candiss Norse    ESRD --Dialysis MWF.  Patient needs to be able to sit in chair to tolerate outpatient dialysis for discharge --Nephrology managing --AVF issue; plan is to reattempt HD tonight, if fails, VVS will intervene   Anemia of chronic disease, component of acute blood loss anemia from wound --Status post 2 unit packed red blood cells 6/29 --Hemoglobin stable    Uncontrolled type 2 diabetes with hypoglycemia --A1c 7.1 --continue Semglee, NovoLog, sliding scale  Essential hypertension --continue amlodipine, Coreg, hydralazine   Hyperlipidemia --continue Lipitor      Subjective:  Feels ok  Physical Exam: Vitals:   09/18/21 1051 09/18/21 1054 09/18/21 1650 09/18/21 2034  BP:  (!) 143/55 129/61 (!) 145/56  Pulse:  66 68 66  Resp:  17 19 19   Temp: 98.3 F (36.8 C)  98.4 F (36.9 C) 98.6 F (37 C)  TempSrc: Oral     SpO2:  100% 100% 99%  Weight:      Height:       Physical Exam Vitals reviewed.  Constitutional:      General: She is not in acute distress.    Appearance: She is not ill-appearing or toxic-appearing.  Cardiovascular:     Rate and Rhythm: Normal rate  and regular rhythm.     Heart sounds: No murmur heard. Pulmonary:     Effort: Pulmonary effort is normal. No respiratory distress.     Breath sounds: No wheezing, rhonchi or rales.  Neurological:     Mental Status: She is alert.  Psychiatric:        Mood and Affect: Mood normal.        Behavior: Behavior normal.     Data Reviewed:  CBG stable BMP noted, K+ 4.8 Hgb 8.4, stable  Family Communication: none  Disposition: Status is: Inpatient Remains inpatient appropriate because: unwilling to sit for HD  Planned Discharge Destination:  TBD    Time spent: 20 minutes  Author: Murray Hodgkins, MD 09/18/2021 9:26 PM  For on call review [email or web address].

## 2021-09-18 NOTE — Progress Notes (Signed)
I called bedside RN for report this evening for patient to come to dialysis. Patient refused dialysis and would just like to go to IR tomorrow morning. Dr. Joelyn Oms notified.

## 2021-09-18 NOTE — Progress Notes (Signed)
Vascular surgery was called again about left arm AV fistula and difficulty with dialysis.  This has been previously evaluated on multiple occasions including this admission.  Previous fistulogram shows widely patent fistula with one large sidebranch.  Duplex also did not show any focal stenosis.  On exam she has an excellent thrill.  I do not think there is any indication to repeat fistulogram or duplex.  She states it works well depending on the tech that accesses the fistula.  I agree with previous assessment by Dr. Virl Cagey that the only other thing we could offer would be sidebranch ligation see if that increases the flow volumes in the fistula.  If this does not work would likely require new access.  I have discussed with nephrology they are going to try and dialyze her again tonight.  Discussed with patient if it works well tonight we can always cancel her for tomorrow.  Please keep NPO after midnight.  Marty Heck, MD [facility] Office: [place]

## 2021-09-18 NOTE — Progress Notes (Signed)
Patient ID: [name], female   DOB: Sep 21, 1965, 56 y.o.   MRN: [id number] Patient is seen in follow-up status post debridement left thigh with chronic osteomyelitis of the femur with a distal femur fracture.  Examination the wound VAC dressing is clean and dry there is clear serosanguineous fluid in the wound VAC canister.  Patient is 2 weeks out from surgery.  We will have the wound VAC dressing removed and start dry dressing changes.  Patient states she feels like she is safe for discharge to home.

## 2021-09-18 NOTE — Procedures (Addendum)
HD Note All documentation during this treatment represent the data gathered at the stated time, although some data is entered later during the treatment. Patient initial attempts at cannulation were thwarted by the needles clotting.  Once the cannulation was successful and treatment had started, the venous pressures were too high to continue treatment.  [name] was bedside to speak with the patient.  A plan of care was agreed to. Pt remained calm and cooperative during the process.  UF 741ml removed.

## 2021-09-19 ENCOUNTER — Inpatient Hospital Stay (HOSPITAL_COMMUNITY): Payer: Medicare Other | Admitting: Certified Registered"

## 2021-09-19 ENCOUNTER — Encounter (HOSPITAL_COMMUNITY)
Admission: EM | Disposition: A | Discharge status: Home Health | Payer: Self-pay | Source: Home / Self Care | Attending: Internal Medicine

## 2021-09-19 ENCOUNTER — Other Ambulatory Visit: Payer: Self-pay

## 2021-09-19 ENCOUNTER — Inpatient Hospital Stay: Payer: Medicare Other | Admitting: Internal Medicine

## 2021-09-19 ENCOUNTER — Encounter (HOSPITAL_COMMUNITY): Payer: Self-pay | Admitting: Family Medicine

## 2021-09-19 DIAGNOSIS — I120 Hypertensive chronic kidney disease with stage 5 chronic kidney disease or end stage renal disease: Secondary | ICD-10-CM

## 2021-09-19 DIAGNOSIS — N186 End stage renal disease: Secondary | ICD-10-CM | POA: Diagnosis not present

## 2021-09-19 DIAGNOSIS — Z794 Long term (current) use of insulin: Secondary | ICD-10-CM

## 2021-09-19 DIAGNOSIS — E1022 Type 1 diabetes mellitus with diabetic chronic kidney disease: Secondary | ICD-10-CM

## 2021-09-19 DIAGNOSIS — T82898A Other specified complication of vascular prosthetic devices, implants and grafts, initial encounter: Secondary | ICD-10-CM | POA: Diagnosis not present

## 2021-09-19 DIAGNOSIS — Z992 Dependence on renal dialysis: Secondary | ICD-10-CM | POA: Diagnosis not present

## 2021-09-19 DIAGNOSIS — M86252 Subacute osteomyelitis, left femur: Secondary | ICD-10-CM | POA: Diagnosis not present

## 2021-09-19 DIAGNOSIS — N185 Chronic kidney disease, stage 5: Secondary | ICD-10-CM | POA: Diagnosis not present

## 2021-09-19 DIAGNOSIS — F17210 Nicotine dependence, cigarettes, uncomplicated: Secondary | ICD-10-CM

## 2021-09-19 DIAGNOSIS — E11649 Type 2 diabetes mellitus with hypoglycemia without coma: Secondary | ICD-10-CM | POA: Diagnosis not present

## 2021-09-19 HISTORY — PX: REVISON OF ARTERIOVENOUS FISTULA: SHX6074

## 2021-09-19 LAB — CBC
HCT: 25.6 % — ABNORMAL LOW (ref 36.0–46.0)
Hemoglobin: 8.1 g/dL — ABNORMAL LOW (ref 12.0–15.0)
MCH: 29.5 pg (ref 26.0–34.0)
MCHC: 31.6 g/dL (ref 30.0–36.0)
MCV: 93.1 fL (ref 80.0–100.0)
Platelets: 241 10*3/uL (ref 150–400)
RBC: 2.75 MIL/uL — ABNORMAL LOW (ref 3.87–5.11)
RDW: 16 % — ABNORMAL HIGH (ref 11.5–15.5)
WBC: 7.3 10*3/uL (ref 4.0–10.5)
nRBC: 0 % (ref 0.0–0.2)

## 2021-09-19 LAB — BASIC METABOLIC PANEL
Anion gap: 8 (ref 5–15)
BUN: 39 mg/dL — ABNORMAL HIGH (ref 6–20)
CO2: 27 mmol/L (ref 22–32)
Calcium: 8.9 mg/dL (ref 8.9–10.3)
Chloride: 93 mmol/L — ABNORMAL LOW (ref 98–111)
Creatinine, Ser: 5.4 mg/dL — ABNORMAL HIGH (ref 0.44–1.00)
GFR, Estimated: 9 mL/min — ABNORMAL LOW (ref >60)
Glucose, Bld: 92 mg/dL (ref 70–99)
Potassium: 5.5 mmol/L — ABNORMAL HIGH (ref 3.5–5.1)
Sodium: 128 mmol/L — ABNORMAL LOW (ref 135–145)

## 2021-09-19 LAB — GLUCOSE, CAPILLARY
Glucose-Capillary: 79 mg/dL (ref 70–99)
Glucose-Capillary: 86 mg/dL (ref 70–99)
Glucose-Capillary: 86 mg/dL (ref 70–99)
Glucose-Capillary: 107 mg/dL — ABNORMAL HIGH (ref 70–99)
Glucose-Capillary: 171 mg/dL — ABNORMAL HIGH (ref 70–99)

## 2021-09-19 SURGERY — REVISON OF ARTERIOVENOUS FISTULA
Anesthesia: Monitor Anesthesia Care | Site: Arm Upper | Laterality: Left

## 2021-09-19 MED ORDER — HEPARIN 6000 UNIT IRRIGATION SOLUTION
Status: DC | PRN
  Administered 2021-09-19: 1

## 2021-09-19 MED ORDER — ALTEPLASE 2 MG IJ SOLR: 2.0000 mg | Freq: Once | INTRAMUSCULAR | Status: DC | PRN

## 2021-09-19 MED ORDER — LIDOCAINE HCL (PF) 1 % IJ SOLN
INTRAMUSCULAR | Status: AC
  Filled 2021-09-19: qty 30

## 2021-09-19 MED ORDER — 0.9 % SODIUM CHLORIDE (POUR BTL) OPTIME
TOPICAL | Status: DC | PRN
  Administered 2021-09-19: 1000 mL

## 2021-09-19 MED ORDER — FENTANYL CITRATE (PF) 100 MCG/2ML IJ SOLN
INTRAMUSCULAR | Status: AC
  Filled 2021-09-19: qty 2

## 2021-09-19 MED ORDER — CEFAZOLIN SODIUM-DEXTROSE 2-3 GM-%(50ML) IV SOLR
INTRAVENOUS | Status: DC | PRN
  Administered 2021-09-19: 2 g via INTRAVENOUS

## 2021-09-19 MED ORDER — BUPIVACAINE HCL (PF) 0.25 % IJ SOLN
INTRAMUSCULAR | Status: AC
  Filled 2021-09-19: qty 30

## 2021-09-19 MED ORDER — BUPIVACAINE HCL 0.25 % IJ SOLN
INTRAMUSCULAR | Status: DC | PRN
  Administered 2021-09-19: 4 mL

## 2021-09-19 MED ORDER — ONDANSETRON HCL 4 MG/2ML IJ SOLN
INTRAMUSCULAR | Status: DC | PRN
  Administered 2021-09-19: 4 mg via INTRAVENOUS

## 2021-09-19 MED ORDER — HEPARIN SODIUM (PORCINE) 1000 UNIT/ML DIALYSIS
20.0000 [IU]/kg | INTRAMUSCULAR | Status: DC | PRN
  Administered 2021-09-20: 2000 [IU] via INTRAVENOUS_CENTRAL
  Filled 2021-09-19: qty 2

## 2021-09-19 MED ORDER — CHLORHEXIDINE GLUCONATE CLOTH 2 % EX PADS
6.0000 | MEDICATED_PAD | Freq: Every day | CUTANEOUS | Status: DC
  Administered 2021-09-20 – 2021-09-23 (×3): 6 via TOPICAL

## 2021-09-19 MED ORDER — FENTANYL CITRATE (PF) 250 MCG/5ML IJ SOLN
INTRAMUSCULAR | Status: DC | PRN
  Administered 2021-09-19: 25 ug via INTRAVENOUS
  Administered 2021-09-19 (×2): 50 ug via INTRAVENOUS
  Administered 2021-09-19: 25 ug via INTRAVENOUS

## 2021-09-19 MED ORDER — FENTANYL CITRATE (PF) 100 MCG/2ML IJ SOLN: 100.0000 ug | Freq: Once | INTRAMUSCULAR | Status: DC

## 2021-09-19 MED ORDER — MIDAZOLAM HCL 2 MG/2ML IJ SOLN
INTRAMUSCULAR | Status: AC
Start: 2021-09-19 — End: ?
  Filled 2021-09-19: qty 2

## 2021-09-19 MED ORDER — HEPARIN SODIUM (PORCINE) 1000 UNIT/ML DIALYSIS: 20.0000 [IU]/kg | INTRAMUSCULAR | Status: DC | PRN

## 2021-09-19 MED ORDER — HEPARIN 6000 UNIT IRRIGATION SOLUTION
Status: AC
Start: 2021-09-19 — End: ?
  Filled 2021-09-19: qty 500

## 2021-09-19 MED ORDER — PROPOFOL 500 MG/50ML IV EMUL
INTRAVENOUS | Status: DC | PRN
  Administered 2021-09-19: 75 ug/kg/min via INTRAVENOUS

## 2021-09-19 MED ORDER — MIDAZOLAM HCL 2 MG/2ML IJ SOLN
2.0000 mg | Freq: Once | INTRAMUSCULAR | Status: DC
  Filled 2021-09-19: qty 2

## 2021-09-19 MED ORDER — ACETAMINOPHEN 10 MG/ML IV SOLN: 1000.0000 mg | Freq: Once | INTRAVENOUS | Status: DC | PRN

## 2021-09-19 MED ORDER — HEMOSTATIC AGENTS (NO CHARGE) OPTIME
TOPICAL | Status: DC | PRN
  Administered 2021-09-19: 1 via TOPICAL

## 2021-09-19 MED ORDER — MIDAZOLAM HCL 2 MG/2ML IJ SOLN
INTRAMUSCULAR | Status: DC | PRN
  Administered 2021-09-19: 2 mg via INTRAVENOUS

## 2021-09-19 MED ORDER — CHLORHEXIDINE GLUCONATE 0.12 % MT SOLN
OROMUCOSAL | Status: AC
  Filled 2021-09-19: qty 15

## 2021-09-19 MED ORDER — FAMOTIDINE 20 MG PO TABS
20.0000 mg | ORAL_TABLET | Freq: Every day | ORAL | Status: DC
  Administered 2021-09-19 – 2022-02-12 (×145): 20 mg via ORAL
  Filled 2021-09-19 (×146): qty 1

## 2021-09-19 MED ORDER — PENTAFLUOROPROP-TETRAFLUOROETH EX AERO: 1.0000 | INHALATION_SPRAY | CUTANEOUS | Status: DC | PRN

## 2021-09-19 MED ORDER — FENTANYL CITRATE (PF) 250 MCG/5ML IJ SOLN
INTRAMUSCULAR | Status: AC
  Filled 2021-09-19: qty 5

## 2021-09-19 MED ORDER — FENTANYL CITRATE (PF) 100 MCG/2ML IJ SOLN
25.0000 ug | INTRAMUSCULAR | Status: DC | PRN
  Administered 2021-09-19: 25 ug via INTRAVENOUS

## 2021-09-19 SURGICAL SUPPLY — 34 items
ADH SKN CLS APL DERMABOND .7 (GAUZE/BANDAGES/DRESSINGS) ×1
ARMBAND PINK RESTRICT EXTREMIT (MISCELLANEOUS) ×2 IMPLANT
BAG COUNTER SPONGE SURGICOUNT (BAG) ×2 IMPLANT
BAG SPNG CNTER NS LX DISP (BAG) ×1
BNDG ELASTIC 4X5.8 VLCR STR LF (GAUZE/BANDAGES/DRESSINGS) ×1 IMPLANT
CANISTER SUCT 3000ML PPV (MISCELLANEOUS) ×2 IMPLANT
CLIP LIGATING EXTRA MED SLVR (CLIP) ×2 IMPLANT
CLIP LIGATING EXTRA SM BLUE (MISCELLANEOUS) ×2 IMPLANT
COVER PROBE W GEL 5X96 (DRAPES) IMPLANT
DERMABOND ADVANCED (GAUZE/BANDAGES/DRESSINGS) ×1
DERMABOND ADVANCED .7 DNX12 (GAUZE/BANDAGES/DRESSINGS) ×1 IMPLANT
ELECT REM PT RETURN 9FT ADLT (ELECTROSURGICAL) ×2
ELECTRODE REM PT RTRN 9FT ADLT (ELECTROSURGICAL) ×1 IMPLANT
GLOVE BIO SURGEON STRL SZ7.5 (GLOVE) ×2 IMPLANT
GLOVE BIOGEL PI IND STRL 8 (GLOVE) ×1 IMPLANT
GLOVE BIOGEL PI INDICATOR 8 (GLOVE) ×1
GOWN STRL REUS W/ TWL LRG LVL3 (GOWN DISPOSABLE) ×2 IMPLANT
GOWN STRL REUS W/TWL 2XL LVL3 (GOWN DISPOSABLE) ×3 IMPLANT
GOWN STRL REUS W/TWL LRG LVL3 (GOWN DISPOSABLE) ×4
HEMOSTAT SNOW SURGICEL 2X4 (HEMOSTASIS) ×1 IMPLANT
KIT BASIN OR (CUSTOM PROCEDURE TRAY) ×2 IMPLANT
KIT TURNOVER KIT B (KITS) ×2 IMPLANT
NS IRRIG 1000ML POUR BTL (IV SOLUTION) ×2 IMPLANT
PACK CV ACCESS (CUSTOM PROCEDURE TRAY) ×2 IMPLANT
PAD ARMBOARD 7.5X6 YLW CONV (MISCELLANEOUS) ×4 IMPLANT
SPIKE FLUID TRANSFER (MISCELLANEOUS) ×2 IMPLANT
STAPLER VISISTAT 35W (STAPLE) IMPLANT
SUT MNCRL AB 4-0 PS2 18 (SUTURE) ×3 IMPLANT
SUT PROLENE 6 0 BV (SUTURE) ×1 IMPLANT
SUT SILK 3 0 SH CR/8 (SUTURE) ×2 IMPLANT
SUT VIC AB 3-0 SH 27 (SUTURE) ×4
SUT VIC AB 3-0 SH 27X BRD (SUTURE) ×1 IMPLANT
TOWEL GREEN STERILE (TOWEL DISPOSABLE) ×2 IMPLANT
UNDERPAD 30X36 HEAVY ABSORB (UNDERPADS AND DIAPERS) ×2 IMPLANT

## 2021-09-19 NOTE — Anesthesia Procedure Notes (Signed)
Procedure Name: MAC Date/Time: 09/19/2021 1:15 PM  Performed by: Griffin Dakin, CRNAPre-anesthesia Checklist: Emergency Drugs available, Patient identified, Suction available, Patient being monitored and Timeout performed Patient Re-evaluated:Patient Re-evaluated prior to induction Oxygen Delivery Method: Simple face mask Induction Type: IV induction Placement Confirmation: positive ETCO2 and breath sounds checked- equal and bilateral Dental Injury: Teeth and Oropharynx as per pre-operative assessment

## 2021-09-19 NOTE — Anesthesia Postprocedure Evaluation (Signed)
Anesthesia Post Note  Patient: [name]  Procedure(s) Performed: LIGATION OF LEFT ARM ARTERIOVENOUS FISTULA (Left: Arm Upper)     Patient location during evaluation: PACU Anesthesia Type: MAC Level of consciousness: awake and alert Pain management: pain level controlled Vital Signs Assessment: post-procedure vital signs reviewed and stable Respiratory status: spontaneous breathing, nonlabored ventilation, respiratory function stable and patient connected to nasal cannula oxygen Cardiovascular status: stable and blood pressure returned to baseline Postop Assessment: no apparent nausea or vomiting Anesthetic complications: no   No notable events documented.  Last Vitals:  Vitals:   09/19/21 1445 09/19/21 1457  BP: (!) 157/63 (!) 142/61  Pulse: 72 70  Resp: 10 16  Temp:  36.8 C  SpO2: 94% 98%    Last Pain:  Vitals:   09/19/21 1457  TempSrc: Oral  PainSc:                  March Rummage Eliasar Hlavaty

## 2021-09-19 NOTE — Progress Notes (Signed)
  Daily Progress Note   Subjective: No complaints  Objective: Vitals:   09/19/21 1202 09/19/21 1240  BP: (!) 158/64 (!) 158/57  Pulse: 68 65  Resp: 18 18  Temp: 98.4 F (36.9 C)   SpO2: 96% 100%    Physical Examination Palpable thrill in the left arm  ASSESSMENT/PLAN:  Pt with fistula malfunction, clotting issues.  Imaging reviewed, large side branch.  After discussing the risks and benefits of branch ligation, Sakshi elected to proceed.    Cassandria Santee MD MS [facility] [phone] 09/19/2021  12:57 PM

## 2021-09-19 NOTE — Anesthesia Preprocedure Evaluation (Addendum)
Anesthesia Evaluation  Patient identified by MRN, date of birth, ID band Patient awake    Reviewed: Allergy & Precautions, NPO status , Patient's Chart, lab work & pertinent test results  Airway Mallampati: II  TM Distance: >3 FB Neck ROM: Full    Dental  (+) Poor Dentition, Missing   Pulmonary neg pulmonary ROS, Current Smoker and Patient abstained from smoking.,    Pulmonary exam normal        Cardiovascular hypertension, Pt. on medications and Pt. on home beta blockers  Rhythm:Regular Rate:Normal     Neuro/Psych Anxiety Depression CVA    GI/Hepatic Neg liver ROS, GERD  ,  Endo/Other  diabetes, Type 1, Insulin Dependent  Renal/GU ESRF and DialysisRenal disease  negative genitourinary   Musculoskeletal  (+) Arthritis , Osteoarthritis,    Abdominal Normal abdominal exam  (+)   Peds  Hematology  (+) Blood dyscrasia, anemia ,   Anesthesia Other Findings   Reproductive/Obstetrics                           Anesthesia Physical Anesthesia Plan  ASA: 3  Anesthesia Plan: MAC   Post-op Pain Management:    Induction: Intravenous  PONV Risk Score and Plan: 1 and Ondansetron, Dexamethasone, Propofol infusion and Treatment may vary due to age or medical condition  Airway Management Planned: Simple Face Mask, Natural Airway and Nasal Cannula  Additional Equipment: None  Intra-op Plan:   Post-operative Plan:   Informed Consent: I have reviewed the patients History and Physical, chart, labs and discussed the procedure including the risks, benefits and alternatives for the proposed anesthesia with the patient or authorized representative who has indicated his/her understanding and acceptance.     Dental advisory given  Plan Discussed with: CRNA  Anesthesia Plan Comments: (Lab Results      Component                Value               Date                      WBC                      7.3                  09/19/2021                HGB                      8.1 (L)             09/19/2021                HCT                      25.6 (L)            09/19/2021                MCV                      93.1                09/19/2021                PLT  241                 09/19/2021           Lab Results      Component                Value               Date                      NA                       128 (L)             09/19/2021                K                        5.5 (H)             09/19/2021                CO2                      27                  09/19/2021                GLUCOSE                  92                  09/19/2021                BUN                      39 (H)              09/19/2021                CREATININE               5.40 (H)            09/19/2021                CALCIUM                  8.9                 09/19/2021                GFRNONAA                 9 (L)               09/19/2021          )       Anesthesia Quick Evaluation

## 2021-09-19 NOTE — Progress Notes (Signed)
Dr Virl Cagey did not want a block for patient's surgery.  Informed Dr Gloris Manchester via vocera with Dr Virl Cagey present.

## 2021-09-19 NOTE — Progress Notes (Signed)
[facility] Progress Note   Subjective:    Seen and examined patient at bedside. She is lying bed resting comfortably. Informed by HD RN this morning that patient refused HD overnight-discussed this with VVS PA. Per VVS, she is on OR schedule for revision today. Patient will need HD afterwards.  Objective Vitals:   09/18/21 1650 09/18/21 2034 09/19/21 0454 09/19/21 0957  BP: 129/61 (!) 145/56 (!) 147/57 (!) 154/64  Pulse: 68 66 66 67  Resp: 19 19 18 18   Temp: 98.4 F (36.9 C) 98.6 F (37 C) 98.5 F (36.9 C) 98.6 F (37 C)  TempSrc:   Oral Oral  SpO2: 100% 99% 100% 100%  Weight:      Height:       Physical Exam General: Chronically ill-appearing; on RA; NAD Heart: S1 and S2; No murmurs, gallops, or rubs Lungs: Mildly diminished LLB, otherwise clear; No wheezing, rales, or rhonchi  Abdomen: Soft and non-tender Extremities: Noted RUE edema > LUE; R Thigh 1+ edema and compression hose, L BKA trace edema, left hip wound VAC Dialysis Access: L AVF (+) B/T   Filed Weights   09/16/21 1448 09/18/21 0837 09/18/21 1000  Weight: 96.7 kg 102.7 kg 101.8 kg    Intake/Output Summary (Last 24 hours) at 09/19/2021 1038 Last data filed at 09/19/2021 0900 Gross per 24 hour  Intake 1010 ml  Output 900 ml  Net 110 ml    Additional Objective Labs: Basic Metabolic Panel: Recent Labs  Lab 09/13/21 0549 09/16/21 0240 09/18/21 0518  NA 125* 124* 126*  K 4.7 5.3* 4.8  CL 90* 89* 92*  CO2 23 24 26   GLUCOSE 118* 133* 97  BUN 37* 45* 32*  CREATININE 4.46* 5.50* 4.62*  CALCIUM 8.3* 8.3* 8.8*  PHOS 5.3*  --  4.2   Liver Function Tests: Recent Labs  Lab 09/18/21 0518  ALBUMIN 2.4*   No results for input(s): "LIPASE", "AMYLASE" in the last 168 hours. CBC: Recent Labs  Lab 09/13/21 0549 09/16/21 0240 09/18/21 0518  WBC 8.1 7.9 7.7  NEUTROABS  --   --  3.9  HGB 8.4* 8.5* 8.4*  HCT 26.0* 26.5* 26.6*  MCV 93.2 92.7 94.0  PLT 241 244 240   Blood Culture     Component Value Date/Time   SDES TISSUE 08/28/2021 0917   SPECREQUEST LEFT THIGH TISSUE 08/28/2021 0917   CULT  08/28/2021 0917    MODERATE ENTEROCOCCUS FAECIUM VANCOMYCIN RESISTANT ENTEROCOCCUS ISOLATED NO ANAEROBES ISOLATED Sent to [place] for further susceptibility testing. Performed at [facility], [place] [address] [place], [place] [address]    REPTSTATUS 09/02/2021 FINAL 08/28/2021 0917    Cardiac Enzymes: Recent Labs  Lab 09/13/21 0549  CKTOTAL 39   CBG: Recent Labs  Lab 09/18/21 0737 09/18/21 1128 09/18/21 1650 09/18/21 2034 09/19/21 0718  GLUCAP 124* 152* 111* 152* 79   Iron Studies: No results for input(s): "IRON", "TIBC", "TRANSFERRIN", "FERRITIN" in the last 72 hours. Lab Results  Component Value Date   INR 1.1 12/28/2019   INR 1.2 12/17/2018   INR 0.99 04/07/2017   Studies/Results: No results found.  Medications:  sodium chloride     sodium chloride Stopped (08/29/21 0139)   sodium chloride     DAPTOmycin (CUBICIN) 900 mg in sodium chloride 0.9 % IVPB 900 mg (09/16/21 2020)   methocarbamol (ROBAXIN) IV      alosetron  1 mg Oral BID   amitriptyline  25 mg Oral QHS   amLODipine  5 mg Oral Daily   atorvastatin  40 mg Oral q1800   carvedilol  6.25 mg Oral BID   cyclobenzaprine  10 mg Oral TID   darbepoetin (ARANESP) injection - DIALYSIS  200 mcg Intravenous Q Fri-HD   docusate sodium  100 mg Oral BID   DULoxetine  60 mg Oral Daily   furosemide  80 mg Oral Daily   heparin  5,000 Units Subcutaneous Q8H   hydrALAZINE  50 mg Oral TID   insulin aspart  0-6 Units Subcutaneous TID WC   insulin aspart  5 Units Subcutaneous TID WC   insulin glargine-yfgn  12 Units Subcutaneous QHS   multivitamin  1 tablet Oral QHS   pantoprazole  80 mg Oral Daily   sevelamer carbonate  1,600 mg Oral TID WC   sodium chloride flush  3 mL Intravenous Q12H    Dialysis Orders: [facility] MWF 4h  400/500  90kg  2/2 bath  P2  Heparin 4000 units IV TIW  LUA  AVF  Assessment/Plan:  L hip infection: S/p 08/10/21 surgery -> persistent infec VRE Cx > OR 08/28/2021 +08/31/21 for L hip debridement, wound vac .on Daptomycin/" ID recommends continuing Daptomycin 6 weeks on HD stop 10/12/21 // f/u with Dr. Candiss Norse on 7/20". ESRD - on HD MWF. Serial HD 06/28, 6/29 ,6/30. Marland Kitchen Vol . NA now 124; K 5.3. Follow trends. Started back use Heparin  7/14 with HD d/t machine clotting off. Continue HD 4 hours 15 minutes to help facilitate UF and recliner hemodialysis. Unable to complete HD yesterday d/t malfunction AVF-re-consulted VVS. Patient on OR schedule today for revision. She is volume up. Plan for HD after procedure today and either tomorrow or Saturday to place her back on routine schedule next week. Access - Difficult cannulation on 7/07. Informed by HD RN yesterday of noted clots from AVF and high arterial pressures, thus, unable to complete HD. Re-consulted VVS. S/p Fistulogram 6/22 by Dr. Stanford Breed. S/p fistula duplex 7/8. Both Fistulogram and duplex were negative. Per HD Charge RN, been having issues with her access for couple weeks and also ensured experienced staff have been cannulating patient. Discussed case with Dr. Carlis Abbott. I wanted try to re-stick her overnight, but she refused. Please see #2 on access plan. HTN/ volume: BP high admit but now more improved. Weights still up (6kg). Net UF 4.5 Last hd . Bed wts. Not Accurate . On amlodipine 5mg  q d and carved. 6.25mg  bid /may need to taper down BP meds. Push UF as tolerated with HD. Anemia of ESRD- Hgb now 8.4. Hgb declined post op - 2U PRBCs given 6/29. Was  Holding heparin with HD BUT with HD TXs clotting, start back using Hep on 7/14. Noted Tsat 22%, Iron 50, and Ferritin 532-cannot give Fe d/t current infection and on ABXs. Continue Aranesp 200 mcg and monitor trend. Secondary HPTH: CCa and PO4 in range. Cont binders.  DM2: Insulin per primary Dispo: Must dialyze in chair  Tobie Poet, NP [facility] 09/19/2021,10:38 AM  LOS: 24 days

## 2021-09-19 NOTE — Transfer of Care (Signed)
Immediate Anesthesia Transfer of Care Note  Patient: [name]  Procedure(s) Performed: LIGATION OF LEFT ARM ARTERIOVENOUS FISTULA (Left: Arm Upper)  Patient Location: PACU  Anesthesia Type:MAC  Level of Consciousness: awake, alert  and oriented  Airway & Oxygen Therapy: Patient Spontanous Breathing  Post-op Assessment: Report given to RN and Post -op Vital signs reviewed and stable  Post vital signs: Reviewed and stable  Last Vitals:  Vitals Value Taken Time  BP 178/63 09/19/21 1415  Temp    Pulse 73 09/19/21 1416  Resp 9 09/19/21 1416  SpO2 94 % 09/19/21 1416  Vitals shown include unvalidated device data.  Last Pain:  Vitals:   09/19/21 1240  TempSrc:   PainSc: 5       Patients Stated Pain Goal: 3 (62/70/35 0093)  Complications: No notable events documented.

## 2021-09-19 NOTE — Progress Notes (Signed)
PT Cancellation Note  Patient Details Name: [name] MRN: [id number] DOB: 1965/12/07   Cancelled Treatment:    Reason Eval/Treat Not Completed: Patient at procedure or test/unavailable  Patient in surgery. Patient has been seen 1x this week which is her new frequency. Will plan to follow-up 7/25 on non-dialysis day.   Arby Barrette, PT Acute Rehabilitation Services  Office [phone]   [name] 09/19/2021, 12:31 PM

## 2021-09-19 NOTE — Progress Notes (Signed)
  Progress Note   Patient: [name] TZG:[id number] DOB: [birth year] DOA: 08/26/2021     24 DOS: the patient was seen and examined on 09/19/2021   Brief hospital course: 56 year old woman PMH including ESRD, complicated left hip and femur issues requiring multiple surgeries, removal of hardware presented 6/26 with bloody drainage from surgical incision.  Seen by orthopedics and underwent I&D 6/28 and 7/1.  Currently unable to progress care secondary to refusal to sit in chair for HD.  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  Follow-up with Dr. Candiss Norse    ESRD --Dialysis MWF.  Patient needs to be able to sit in chair to tolerate outpatient dialysis for discharge --Nephrology managing --no s/p fistula revision and branch ligation.   Anemia of chronic disease, component of acute blood loss anemia from wound --Status post 2 unit packed red blood cells 6/29 --Hemoglobin stable    Uncontrolled type 2 diabetes with hypoglycemia --A1c 7.1 --continue Semglee, NovoLog, sliding scale  Essential hypertension --continue amlodipine, Coreg, hydralazine   Hyperlipidemia --continue Lipitor      Subjective:  Feels ok Doesn't want to go to SNF Doesn't want to leave hospital until leg is better  Physical Exam: Vitals:   09/19/21 1415 09/19/21 1430 09/19/21 1445 09/19/21 1457  BP: (!) 178/63 (!) 161/67 (!) 157/63 (!) 142/61  Pulse: 74 72 72 70  Resp: 13 12 10 16   Temp:    98.3 F (36.8 C)  TempSrc:    Oral  SpO2: 94% 96% 94% 98%  Weight:      Height:       Physical Exam Vitals reviewed.  Constitutional:      General: She is not in acute distress.    Appearance: She is not ill-appearing or toxic-appearing.   Cardiovascular:     Rate and Rhythm: Normal rate and regular rhythm.     Heart sounds: No murmur heard. Pulmonary:     Effort: Pulmonary effort is normal. No respiratory distress.     Breath sounds: No wheezing, rhonchi or rales.  Neurological:     Mental Status: She is alert.  Psychiatric:        Mood and Affect: Mood normal.        Behavior: Behavior normal.     Data Reviewed:  K+ 5.5 Hgb 8/1  Family Communication: none  Disposition: Status is: Inpatient Remains inpatient appropriate because: not yet sitting for HD  Planned Discharge Destination:  TBD    Time spent: 25 minutes  Author: Murray Hodgkins, MD 09/19/2021 5:44 PM  For on call review [email or web address].

## 2021-09-19 NOTE — Op Note (Signed)
    NAME: DAESIA ZYLKA    MRN: [id number] DOB: Dec 25, 1965    DATE OF OPERATION: 09/19/2021  PREOP DIAGNOSIS:    Fistula malfunction  POSTOP DIAGNOSIS:    Same  PROCEDURE:    Fistula revision-branch ligation  SURGEON: Broadus John  ASSIST: Dagoberto Ligas, PA  ANESTHESIA: Moderate, local  EBL: 5 mL  INDICATIONS:    EMMILIA SOWDER is a 56 y.o. female with previous left brachiocephalic fistula.  Recently dialysis nurses found a difficult time cannulating, and noting significant clot.  Fistulogram demonstrated widely patent fistula, 2 large branches appreciated.  After discussing the risk and benefits of fistula branch ligation in an effort to improve fistula flow, Sameria elected to proceed.  FINDINGS:   2 large branches ligated.  TECHNIQUE:   Patient was brought to the OR laid in supine position.  Moderate anesthesia was induced and the patient was prepped draped in standard fashion.  The case began with ultrasound insonation of the left brachiocephalic fistula.  2 large branches were appreciated.  These were marked.  Next, the 2 sites were anesthetized using local anesthetic, and two, 2cm longitudinal incisions were made.  The branches were controlled and ligated with 2-0 silk suture.  Incision sites were irrigated, hemostasis achieved with use of cautery.  The incisions were closed using 3-0 Vicryl suture with Monocryl and Dermabond at the skin.  Macie Burows, MD [facility] DATE OF DICTATION:   09/19/2021

## 2021-09-20 ENCOUNTER — Encounter (HOSPITAL_COMMUNITY): Payer: Self-pay | Admitting: Vascular Surgery

## 2021-09-20 DIAGNOSIS — M86252 Subacute osteomyelitis, left femur: Secondary | ICD-10-CM | POA: Diagnosis not present

## 2021-09-20 DIAGNOSIS — E11649 Type 2 diabetes mellitus with hypoglycemia without coma: Secondary | ICD-10-CM | POA: Diagnosis not present

## 2021-09-20 DIAGNOSIS — N186 End stage renal disease: Secondary | ICD-10-CM | POA: Diagnosis not present

## 2021-09-20 DIAGNOSIS — Z992 Dependence on renal dialysis: Secondary | ICD-10-CM | POA: Diagnosis not present

## 2021-09-20 LAB — GLUCOSE, CAPILLARY
Glucose-Capillary: 112 mg/dL — ABNORMAL HIGH (ref 70–99)
Glucose-Capillary: 140 mg/dL — ABNORMAL HIGH (ref 70–99)
Glucose-Capillary: 187 mg/dL — ABNORMAL HIGH (ref 70–99)
Glucose-Capillary: 171 mg/dL — ABNORMAL HIGH (ref 70–99)

## 2021-09-20 LAB — CK: Total CK: 31 U/L — ABNORMAL LOW (ref 38–234)

## 2021-09-20 NOTE — Progress Notes (Signed)
OT Cancellation Note  Patient Details Name: [name] MRN: [id number] DOB: 01-17-1966   Cancelled Treatment:    Reason Eval/Treat Not Completed: Patient at procedure or test/ unavailable (Pt in HD and historically does not work with therapy on dialysis days.)  Julien Girt 09/20/2021, 9:19 AM

## 2021-09-20 NOTE — Plan of Care (Signed)
  Problem: Activity: Goal: Risk for activity intolerance will decrease Outcome: Progressing   Problem: Pain Managment: Goal: General experience of comfort will improve Outcome: Progressing   Problem: Skin Integrity: Goal: Risk for impaired skin integrity will decrease Outcome: Progressing   

## 2021-09-20 NOTE — Plan of Care (Signed)
  Problem: Tissue Perfusion: Goal: Adequacy of tissue perfusion will improve Outcome: Progressing   Problem: Pain Managment: Goal: General experience of comfort will improve Outcome: Progressing

## 2021-09-20 NOTE — Progress Notes (Signed)
Pharmacy Antibiotic Note  [name] is a 56 y.o. 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 Prior intra-op cultures in May grew MSSA, now with intra-op cultures this admission growing Enterococcus faecium (VRE). Pharmacy has been consulted for Daptomycin dosing.  Noted pt is ESRD and her daptomycin is given after dialysis on MWF.  Monitoring weekly CK remains within normal limits last checked on 7/21, next due 09/27/21.   WBC is within normal limits. Afebrile.  Patient unable to complete HD on 7/19. Dapto dose rescheduled for PM 7/20 but not given until early AM 7/21. Will f/u next HD and give after then. Noted plan to get patient back on schedule next week.     6/30 CK 21 7/7 CK 23 7/14 CK 39  7/21 31  Plan: - Continue Daptomycin 900 mg (10 mg/kg TBW) IV every MWF after HD, anticipated stop date 10/12/21 per ID - Continue with weekly CK monitoring on Fridays - Will continue to follow HD schedule/duration, LOT, and antibiotic de-escalation plans   Height: 5\' 10"  (177.8 cm) Weight: 101.8 kg (224 lb 6.9 oz) IBW/kg (Calculated) : 68.5  Temp (24hrs), Avg:98.3 F (36.8 C), Min:97.5 F (36.4 C), Max:98.7 F (37.1 C)  Recent Labs  Lab 09/16/21 0240 09/18/21 0518 09/19/21 1101  WBC 7.9 7.7 7.3  CREATININE 5.50* 4.62* 5.40*     Estimated Creatinine Clearance: 15.4 mL/min (A) (by C-G formula based on SCr of 5.4 mg/dL (H)).    Allergies  Allergen Reactions   Trazodone Swelling   Latex Rash   Lidocaine Itching   Galia Rahm A. Levada Dy, PharmD, BCPS, FNKF Clinical Pharmacist [place] Please utilize Amion for appropriate phone number to reach the unit pharmacist ([place])   09/20/2021 7:43 AM

## 2021-09-20 NOTE — Progress Notes (Signed)
Inpatient Rehab Admissions Coordinator:   Pt. Is not participating with PT/OT at a level where I believe she can tolerate the intensity of CIR (currently, PT frequency changed to 1x/week). Additionally, Pt. Does not appear to have adequate support at home following a potential CIR admit, so I will sign off.   Clemens Catholic, [place], [place] Admissions Coordinator  [phone] ([place]) [phone] (office)

## 2021-09-20 NOTE — Procedures (Signed)
I was present at this dialysis session. I have reviewed the session itself and made appropriate changes. Access cannulated easily today. Will follow to ensure no issues w/ clotting today.  Filed Weights   09/19/21 1218 09/20/21 0800 09/20/21 0801  Weight: 101.8 kg 98.4 kg 98.4 kg    Recent Labs  Lab 09/18/21 0518 09/19/21 1101  NA 126* 128*  K 4.8 5.5*  CL 92* 93*  CO2 26 27  GLUCOSE 97 92  BUN 32* 39*  CREATININE 4.62* 5.40*  CALCIUM 8.8* 8.9  PHOS 4.2  --     Recent Labs  Lab 09/16/21 0240 09/18/21 0518 09/19/21 1101  WBC 7.9 7.7 7.3  NEUTROABS  --  3.9  --   HGB 8.5* 8.4* 8.1*  HCT 26.5* 26.6* 25.6*  MCV 92.7 94.0 93.1  PLT 244 240 241    Scheduled Meds:  alosetron  1 mg Oral BID   amitriptyline  25 mg Oral QHS   amLODipine  5 mg Oral Daily   atorvastatin  40 mg Oral q1800   carvedilol  6.25 mg Oral BID   Chlorhexidine Gluconate Cloth  6 each Topical Q0600   cyclobenzaprine  10 mg Oral TID   darbepoetin (ARANESP) injection - DIALYSIS  200 mcg Intravenous Q Fri-HD   docusate sodium  100 mg Oral BID   DULoxetine  60 mg Oral Daily   famotidine  20 mg Oral Daily   fentaNYL (SUBLIMAZE) injection  100 mcg Intravenous Once   furosemide  80 mg Oral Daily   heparin  5,000 Units Subcutaneous Q8H   hydrALAZINE  50 mg Oral TID   insulin aspart  0-6 Units Subcutaneous TID WC   insulin aspart  5 Units Subcutaneous TID WC   insulin glargine-yfgn  12 Units Subcutaneous QHS   midazolam  2 mg Intravenous Once   multivitamin  1 tablet Oral QHS   pantoprazole  80 mg Oral Daily   sevelamer carbonate  1,600 mg Oral TID WC   sodium chloride flush  3 mL Intravenous Q12H   Continuous Infusions:  sodium chloride     sodium chloride Stopped (08/29/21 0139)   sodium chloride 10 mL/hr at 09/19/21 1310   DAPTOmycin (CUBICIN) 900 mg in sodium chloride 0.9 % IVPB 900 mg (09/20/21 0027)   methocarbamol (ROBAXIN) IV     PRN Meds:.sodium chloride, acetaminophen, alteplase,  bisacodyl, calcium carbonate, heparin, methocarbamol **OR** methocarbamol (ROBAXIN) IV, ondansetron **OR** ondansetron (ZOFRAN) IV, mouth rinse, oxyCODONE, oxyCODONE, pentafluoroprop-tetrafluoroeth, polyethylene glycol, prochlorperazine, sodium chloride flush   Donna Bumpers,  MD 09/20/2021, 10:40 AM

## 2021-09-20 NOTE — Progress Notes (Signed)
  Progress Note   Patient: [name] DJS:[id number] DOB: [birth year] DOA: 08/26/2021     25 DOS: the patient was seen and examined on 09/20/2021   Brief hospital course: 56 year old woman PMH including ESRD, complicated left hip and femur issues requiring multiple surgeries, removal of hardware presented 6/26 with bloody drainage from surgical incision.  Seen by orthopedics and underwent I&D 6/28 and 7/1.  Currently unable to progress care secondary to refusal to sit in chair for HD.  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 Follow-up with Dr. Candiss Norse    ESRD --Dialysis MWF.  Patient needs to be able to sit in chair to tolerate outpatient dialysis for discharge --Nephrology managing --no s/p fistula revision and branch ligation.   Anemia of chronic disease, component of acute blood loss anemia from wound --Status post 2 unit packed red blood cells 6/29 --Hemoglobin stable    Uncontrolled type 2 diabetes with hypoglycemia --A1c 7.1 --continue Semglee, NovoLog, sliding scale. CBG stable  Essential hypertension --continue amlodipine, Coreg, hydralazine   Hyperlipidemia --continue Lipitor  Discussed with patient, needs to set up for hemodialysis.  She is determined to go home rather than to skilled.  Disposition is difficult and potential for readmission high.      Subjective:  Feels better  Physical Exam: Vitals:   09/20/21 1324 09/20/21 1348 09/20/21 1434 09/20/21 1651  BP: 121/66  (!) 130/53 (!) 135/57  Pulse: 73  71 75  Resp: 18  16 16   Temp:   98.1 F (36.7 C) 98.2 F (36.8 C)  TempSrc:   Oral   SpO2:   100% 97%  Weight:  93.7 kg    Height:       Physical Exam Vitals reviewed.  Constitutional:      General:  She is not in acute distress.    Appearance: She is not ill-appearing or toxic-appearing.  Cardiovascular:     Rate and Rhythm: Normal rate and regular rhythm.     Heart sounds: No murmur heard. Pulmonary:     Effort: Pulmonary effort is normal. No respiratory distress.     Breath sounds: No wheezing, rhonchi or rales.  Musculoskeletal:     Comments: Left thigh wound vac in place  Neurological:     Mental Status: She is alert.  Psychiatric:        Mood and Affect: Mood normal.        Behavior: Behavior normal.     Data Reviewed:  CBG stable  Family Communication: none  Disposition: Status is: Inpatient Remains inpatient appropriate because: needs to sit for HD  Planned Discharge Destination: Home    Time spent: 20 minutes  Author: Murray Hodgkins, MD 09/20/2021 7:21 PM  For on call review [email or web address].

## 2021-09-20 NOTE — Discharge Instructions (Signed)
   [facility]  Discharge Instructions  AV Fistula or Graft Surgery for Dialysis Access  Please refer to the following instructions for your post-procedure care. Your surgeon or physician assistant will discuss any changes with you.  Activity  You may drive the day following your surgery, if you are comfortable and no longer taking prescription pain medication. Resume full activity as the soreness in your incision resolves.  Bathing/Showering  You may shower after you go home. Keep your incision dry for 48 hours. Do not soak in a bathtub, hot tub, or swim until the incision heals completely. You may not shower if you have a hemodialysis catheter.  Incision Care  Clean your incision with mild soap and water after 48 hours. Pat the area dry with a clean towel. You do not need a bandage unless otherwise instructed. Do not apply any ointments or creams to your incision. You may have skin glue on your incision. Do not peel it off. It will come off on its own in about one week. Your arm may swell a bit after surgery. To reduce swelling use pillows to elevate your arm so it is above your heart. Your doctor will tell you if you need to lightly wrap your arm with an ACE bandage.  Diet  Resume your normal diet. There are not special food restrictions following this procedure. In order to heal from your surgery, it is CRITICAL to get adequate nutrition. Your body requires vitamins, minerals, and protein. Vegetables are the best source of vitamins and minerals. Vegetables also provide the perfect balance of protein. Processed food has little nutritional value, so try to avoid this.  Medications  Resume taking all of your medications. If your incision is causing pain, you may take over-the counter pain relievers such as acetaminophen (Tylenol). If you were prescribed a stronger pain medication, please be aware these medications can cause nausea and constipation. Prevent  nausea by taking the medication with a snack or meal. Avoid constipation by drinking plenty of fluids and eating foods with high amount of fiber, such as fruits, vegetables, and grains.  Do not take Tylenol if you are taking prescription pain medications.  Follow up Your surgeon may want to see you in the office following your access surgery. If so, this will be arranged at the time of your surgery.  Please call us immediately for any of the following conditions:  Increased pain, redness, drainage (pus) from your incision site Fever of 101 degrees or higher Severe or worsening pain at your incision site Hand pain or numbness.  Reduce your risk of vascular disease:  Stop smoking. If you would like help, call [facility] at 1-800-QUIT-NOW 919-031-8343) or [place] at [place] your cholesterol Maintain a desired weight Control your diabetes Keep your blood pressure down  Dialysis  It will take several weeks to several months for your new dialysis access to be ready for use. Your surgeon will determine when it is okay to use it. Your nephrologist will continue to direct your dialysis. You can continue to use your Permcath until your new access is ready for use.   09/20/2021 [name] [id number] [birth year]  Surgeon(s): Donna John, MD  Procedure(s): LIGATION OF LEFT ARM ARTERIOVENOUS FISTULA  x May stick graft immediately   May stick graft on designated area only:    Do not stick     If you have any questions, please call the office at [phone].

## 2021-09-20 NOTE — Progress Notes (Signed)
[facility] Progress Note   Subjective: Seen on HD via AVF. S/P branch ligation on AVF per Dr. Virl Cagey 09/19/2021. No complaints. Attempting UF 5 L which may need to be adjusted as her volume looks much better.   Objective Vitals:   09/20/21 0528 09/20/21 0800 09/20/21 0801 09/20/21 0837  BP: (!) 151/58 (!) 137/108  (!) 146/53  Pulse: 64 69  70  Resp: 18 16  14   Temp: 98.7 F (37.1 C) 98.4 F (36.9 C)    TempSrc: Oral Oral    SpO2: 100% 98%  97%  Weight:  98.4 kg 98.4 kg   Height:       Physical Exam General: Pleasant, chronically ill appearing female Heart:S1,S2, No M/R/G. 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 Compression hose RLE, +trace-1+ edema RLE L hip Dialysis Access: L AVF cannulated at present.     Additional Objective Labs: Basic Metabolic Panel: Recent Labs  Lab 09/16/21 0240 09/18/21 0518 09/19/21 1101  NA 124* 126* 128*  K 5.3* 4.8 5.5*  CL 89* 92* 93*  CO2 24 26 27   GLUCOSE 133* 97 92  BUN 45* 32* 39*  CREATININE 5.50* 4.62* 5.40*  CALCIUM 8.3* 8.8* 8.9  PHOS  --  4.2  --    Liver Function Tests: Recent Labs  Lab 09/18/21 0518  ALBUMIN 2.4*   No results for input(s): "LIPASE", "AMYLASE" in the last 168 hours. CBC: Recent Labs  Lab 09/16/21 0240 09/18/21 0518 09/19/21 1101  WBC 7.9 7.7 7.3  NEUTROABS  --  3.9  --   HGB 8.5* 8.4* 8.1*  HCT 26.5* 26.6* 25.6*  MCV 92.7 94.0 93.1  PLT 244 240 241   Blood Culture    Component Value Date/Time   SDES TISSUE 08/28/2021 0917   SPECREQUEST LEFT THIGH TISSUE 08/28/2021 0917   CULT  08/28/2021 0917    MODERATE ENTEROCOCCUS FAECIUM VANCOMYCIN RESISTANT ENTEROCOCCUS ISOLATED NO ANAEROBES ISOLATED Sent to [place] for further susceptibility testing. Performed at [facility], [place] [address], [place], [place] [address]    REPTSTATUS 09/02/2021 FINAL 08/28/2021 0917    Cardiac Enzymes: Recent Labs  Lab  09/20/21 0403  CKTOTAL 31*   CBG: Recent Labs  Lab 09/19/21 1129 09/19/21 1411 09/19/21 1631 09/19/21 2149 09/20/21 0733  GLUCAP 86 86 107* 171* 112*   Iron Studies: No results for input(s): "IRON", "TIBC", "TRANSFERRIN", "FERRITIN" in the last 72 hours. @lablastinr3 @ Studies/Results: No results found. Medications:  sodium chloride     sodium chloride Stopped (08/29/21 0139)   sodium chloride 10 mL/hr at 09/19/21 1310   DAPTOmycin (CUBICIN) 900 mg in sodium chloride 0.9 % IVPB 900 mg (09/20/21 0027)   methocarbamol (ROBAXIN) IV      alosetron  1 mg Oral BID   amitriptyline  25 mg Oral QHS   amLODipine  5 mg Oral Daily   atorvastatin  40 mg Oral q1800   carvedilol  6.25 mg Oral BID   Chlorhexidine Gluconate Cloth  6 each Topical Q0600   cyclobenzaprine  10 mg Oral TID   darbepoetin (ARANESP) injection - DIALYSIS  200 mcg Intravenous Q Fri-HD   docusate sodium  100 mg Oral BID   DULoxetine  60 mg Oral Daily   famotidine  20 mg Oral Daily   fentaNYL (SUBLIMAZE) injection  100 mcg Intravenous Once   furosemide  80 mg Oral Daily   heparin  5,000 Units Subcutaneous Q8H  hydrALAZINE  50 mg Oral TID   insulin aspart  0-6 Units Subcutaneous TID WC   insulin aspart  5 Units Subcutaneous TID WC   insulin glargine-yfgn  12 Units Subcutaneous QHS   midazolam  2 mg Intravenous Once   multivitamin  1 tablet Oral QHS   pantoprazole  80 mg Oral Daily   sevelamer carbonate  1,600 mg Oral TID WC   sodium chloride flush  3 mL Intravenous Q12H     Dialysis Orders: [facility] MWF 4h  400/500  90kg  2/2 bath  P2  Heparin 4000 units IV TIW  LUA AVF   Assessment/Plan:  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 Next HD 09/23/2021 AVF Access: Issues with clots, cannulation. To OR 09/19/2021 per Dr. Virl Cagey for branch  ligation. So far, working without issues.  HTN/ volume: Better control of hypertension  Weights still up, max UF as tolerated with HD. On amlodipine 5mg  q d and carvedilol 6.25mg  bid. 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 Max ESA dose today. Transfuse if HGB <7.0.  Secondary HPTH: CCa and PO4 in range. Cont binders.  DM2: Insulin per primary Dispo: Patient has issues with no handicap ramp at her home. OP HD clinic cannot help her get in and out of her car when she arrives to HD. Last admission she was supposed to go to SNF but on day of discharge, she refused SNF placement. Very difficult situation. She is refusing to work with PT. Not clear what chance of success she will have as OP regardless of her ability to sit in chair if her home situation is not improved.  Have reached out to renal MSW.   Donnae Michels H. Amera Banos NP-C 09/20/2021, 8:48 AM  [employer] [phone]

## 2021-09-20 NOTE — Progress Notes (Signed)
Received patient in bed, alert and oriented. Informed consent signed and in chart.  Time tx completed: 1324  HD treatment completed via L upper arm fistula. Patient tolerated well. Patient transported back to the room, alert and orient and in no acute distress. Report given to bedside RN.  Total UF removed: 4.7L  Medication given: none  Post HD VS: 121/66, 82, 98% on RA, 18  Post HD weight: 93.7kg

## 2021-09-20 NOTE — Progress Notes (Signed)
  Progress Note    09/20/2021 3:04 PM 1 Day Post-Op  Subjective:  Patient says she feels good and is happy her fistula is working    Vitals:   09/20/21 1324 09/20/21 1434  BP: 121/66 (!) 130/53  Pulse: 73 71  Resp: 18 16  Temp:  98.1 F (36.7 C)  SpO2:  100%    Physical Exam: Lungs:  nonlabored Incisions:  L arm incisions c/d/l. Ace wrap currently on arm post-dialysis Extremities:  L radial pulse 2+   CBC    Component Value Date/Time   WBC 7.3 09/19/2021 1101   RBC 2.75 (L) 09/19/2021 1101   HGB 8.1 (L) 09/19/2021 1101   HGB 12.0 10/29/2018 1439   HCT 25.6 (L) 09/19/2021 1101   HCT 36.6 10/29/2018 1439   PLT 241 09/19/2021 1101   PLT 345 10/29/2018 1439   MCV 93.1 09/19/2021 1101   MCV 84 10/29/2018 1439   MCH 29.5 09/19/2021 1101   MCHC 31.6 09/19/2021 1101   RDW 16.0 (H) 09/19/2021 1101   RDW 13.4 10/29/2018 1439   LYMPHSABS 2.1 09/18/2021 0518   LYMPHSABS 2.6 10/29/2018 1439   MONOABS 1.1 (H) 09/18/2021 0518   EOSABS 0.4 09/18/2021 0518   EOSABS 0.2 10/29/2018 1439   BASOSABS 0.1 09/18/2021 0518   BASOSABS 0.1 10/29/2018 1439    BMET    Component Value Date/Time   NA 128 (L) 09/19/2021 1101   NA 140 10/29/2018 1439   K 5.5 (H) 09/19/2021 1101   CL 93 (L) 09/19/2021 1101   CO2 27 09/19/2021 1101   GLUCOSE 92 09/19/2021 1101   BUN 39 (H) 09/19/2021 1101   BUN 43 (H) 10/29/2018 1439   CREATININE 5.40 (H) 09/19/2021 1101   CREATININE 1.92 (H) 01/10/2016 1528   CALCIUM 8.9 09/19/2021 1101   CALCIUM 8.3 (L) 01/11/2020 0139   GFRNONAA 9 (L) 09/19/2021 1101   GFRNONAA 30 (L) 01/10/2016 1528   GFRAA 13 (L) 12/17/2018 0907   GFRAA 35 (L) 01/10/2016 1528    INR    Component Value Date/Time   INR 1.1 12/28/2019 1200     Intake/Output Summary (Last 24 hours) at 09/20/2021 1504 Last data filed at 09/20/2021 1324 Gross per 24 hour  Intake 480 ml  Output 5200 ml  Net -4720 ml     Assessment/Plan:  56 y.o. female is 1 day post op, s/p: left  arm fistula revision and branch ligation   -Fistula working well with easy access at dialysis today -Incision is c/d/l -Palpable 2+ left radial pulse   Vicente Serene, PA-C [facility] [phone] 09/20/2021 3:04 PM

## 2021-09-21 DIAGNOSIS — Z794 Long term (current) use of insulin: Secondary | ICD-10-CM | POA: Diagnosis not present

## 2021-09-21 DIAGNOSIS — E11649 Type 2 diabetes mellitus with hypoglycemia without coma: Secondary | ICD-10-CM | POA: Diagnosis not present

## 2021-09-21 DIAGNOSIS — M86252 Subacute osteomyelitis, left femur: Secondary | ICD-10-CM | POA: Diagnosis not present

## 2021-09-21 LAB — GLUCOSE, CAPILLARY
Glucose-Capillary: 114 mg/dL — ABNORMAL HIGH (ref 70–99)
Glucose-Capillary: 116 mg/dL — ABNORMAL HIGH (ref 70–99)
Glucose-Capillary: 141 mg/dL — ABNORMAL HIGH (ref 70–99)
Glucose-Capillary: 146 mg/dL — ABNORMAL HIGH (ref 70–99)

## 2021-09-21 NOTE — Progress Notes (Addendum)
  Progress Note   Patient: [name]:[id number] DOB: 02/02/1966 DOA: 08/26/2021     26 DOS: the patient was seen and examined on 09/21/2021   Brief hospital course: 56 year old woman PMH including ESRD, complicated left hip and femur issues requiring multiple surgeries, removal of hardware presented 6/26 with bloody drainage from surgical incision.  Seen by orthopedics and underwent I&D 6/28 and 7/1.  Currently unable to progress care secondary to refusal to sit in chair for HD.  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 [name] for discharge; follow-up with orthopedics as an outpatient --per ID daptomycin for 6 weeks with dialysis, stop date 8/12. Follow-up with Dr. Candiss Norse    ESRD --Dialysis MWF.  Patient needs to be able to sit in chair to tolerate outpatient dialysis for discharge --Nephrology managing --s/p fistula revision and branch ligation.   Anemia of chronic disease, component of acute blood loss anemia from wound --Status post 2 unit packed red blood cells 6/29 --Hemoglobin stable    Uncontrolled type 2 diabetes with hypoglycemia --A1c 7.1 --CBG stable. Continue Semglee, NovoLog, sliding scale. CBG stable  Essential hypertension --continue amlodipine, Coreg, hydralazine   Hyperlipidemia --continue Lipitor      Subjective:  Feels fine  Physical Exam: Vitals:   09/20/21 2105 09/20/21 2323 09/21/21 0526 09/21/21 0851  BP: (!) 122/48 129/60 131/62 (!) 136/51  Pulse: 67  66 72  Resp: 18  19 18   Temp: 98.2 F (36.8 C)  98.1 F (36.7 C) 99.2 F (37.3 C)  TempSrc:   Oral Oral  SpO2: 97%  100% 98%  Weight:      Height:       Physical Exam Vitals reviewed.  Constitutional:      General: She is not in acute distress.    Appearance: She is not ill-appearing  or toxic-appearing.  Cardiovascular:     Rate and Rhythm: Normal rate and regular rhythm.     Heart sounds: No murmur heard. Pulmonary:     Effort: Pulmonary effort is normal. No respiratory distress.     Breath sounds: No wheezing, rhonchi or rales.  Musculoskeletal:     Comments: Left thigh wound vac in place  Neurological:     Mental Status: She is alert.  Psychiatric:        Mood and Affect: Mood normal.        Behavior: Behavior normal.     Data Reviewed:  CBG stable  Family Communication: none  Disposition: Status is: Inpatient Remains inpatient appropriate because: not sitting for HD, not participating in therapy  Planned Discharge Destination:  TBD, refusing SNF    Time spent: 25 minutes  Author: Murray Hodgkins, MD 09/21/2021 2:04 PM  For on call review [email or web address].

## 2021-09-21 NOTE — Progress Notes (Signed)
[facility] Progress Note   Subjective: Seen in room. Encouraged to get OOB in chair and work with PT. Explained reality that HD center will no longer assist her in and out of car. No C/Os.   Objective Vitals:   09/20/21 2105 09/20/21 2323 09/21/21 0526 09/21/21 0851  BP: (!) 122/48 129/60 131/62 (!) 136/51  Pulse: 67  66 72  Resp: 18  19 18   Temp: 98.2 F (36.8 C)  98.1 F (36.7 C) 99.2 F (37.3 C)  TempSrc:   Oral Oral  SpO2: 97%  100% 98%  Weight:      Height:       Physical Exam General: Pleasant, chronically ill appearing female Heart:S1,S2, No M/R/G. 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 Compression hose RLE, +trace-1+ edema RLE L hip Dialysis Access: L AVF +T/B   Additional Objective Labs: Basic Metabolic Panel: Recent Labs  Lab 09/16/21 0240 09/18/21 0518 09/19/21 1101  NA 124* 126* 128*  K 5.3* 4.8 5.5*  CL 89* 92* 93*  CO2 24 26 27   GLUCOSE 133* 97 92  BUN 45* 32* 39*  CREATININE 5.50* 4.62* 5.40*  CALCIUM 8.3* 8.8* 8.9  PHOS  --  4.2  --    Liver Function Tests: Recent Labs  Lab 09/18/21 0518  ALBUMIN 2.4*   No results for input(s): "LIPASE", "AMYLASE" in the last 168 hours. CBC: Recent Labs  Lab 09/16/21 0240 09/18/21 0518 09/19/21 1101  WBC 7.9 7.7 7.3  NEUTROABS  --  3.9  --   HGB 8.5* 8.4* 8.1*  HCT 26.5* 26.6* 25.6*  MCV 92.7 94.0 93.1  PLT 244 240 241   Blood Culture    Component Value Date/Time   SDES TISSUE 08/28/2021 0917   SPECREQUEST LEFT THIGH TISSUE 08/28/2021 0917   CULT  08/28/2021 0917    MODERATE ENTEROCOCCUS FAECIUM VANCOMYCIN RESISTANT ENTEROCOCCUS ISOLATED NO ANAEROBES ISOLATED Sent to [place] for further susceptibility testing. Performed at [facility], [place] [address] [place], [place] [address]    REPTSTATUS 09/02/2021 FINAL 08/28/2021 0917    Cardiac Enzymes: Recent Labs  Lab 09/20/21 0403  CKTOTAL 31*   CBG: Recent  Labs  Lab 09/20/21 0733 09/20/21 1436 09/20/21 1650 09/20/21 2056 09/21/21 0733  GLUCAP 112* 140* 187* 171* 114*   Iron Studies: No results for input(s): "IRON", "TIBC", "TRANSFERRIN", "FERRITIN" in the last 72 hours. @lablastinr3 @ Studies/Results: No results found. Medications:  sodium chloride     sodium chloride Stopped (08/29/21 0139)   sodium chloride 10 mL/hr at 09/19/21 1310   DAPTOmycin (CUBICIN) 900 mg in sodium chloride 0.9 % IVPB Stopped (09/20/21 2131)   methocarbamol (ROBAXIN) IV      alosetron  1 mg Oral BID   amitriptyline  25 mg Oral QHS   amLODipine  5 mg Oral Daily   atorvastatin  40 mg Oral q1800   carvedilol  6.25 mg Oral BID   Chlorhexidine Gluconate Cloth  6 each Topical Q0600   cyclobenzaprine  10 mg Oral TID   darbepoetin (ARANESP) injection - DIALYSIS  200 mcg Intravenous Q Fri-HD   docusate sodium  100 mg Oral BID   DULoxetine  60 mg Oral Daily   famotidine  20 mg Oral Daily   furosemide  80 mg Oral Daily   heparin  5,000 Units Subcutaneous Q8H   hydrALAZINE  50 mg Oral TID   insulin aspart  0-6 Units Subcutaneous TID WC  insulin aspart  5 Units Subcutaneous TID WC   insulin glargine-yfgn  12 Units Subcutaneous QHS   midazolam  2 mg Intravenous Once   multivitamin  1 tablet Oral QHS   pantoprazole  80 mg Oral Daily   sevelamer carbonate  1,600 mg Oral TID WC   sodium chloride flush  3 mL Intravenous Q12H     Dialysis Orders: [facility] MWF 4h  400/500  90kg  2/2 bath  P2  Heparin 4000 units IV TIW  LUA AVF   Assessment/Plan:  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 Next HD 09/23/2021 AVF Access: Issues with clots, cannulation. To OR 09/19/2021 per Dr. Virl Cagey for branch ligation. So far, working without issues.  HTN/ volume: Better control of hypertension  Weights  still up, max UF as tolerated with HD. On amlodipine 5mg  q d and carvedilol 6.25mg  bid. 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 Max ESA dose today. Transfuse if HGB <7.0.  Secondary HPTH: CCa and PO4 in range. Cont binders.  DM2: Insulin per primary Dispo: Patient has issues with no handicap ramp at her home. OP HD clinic cannot help her get in and out of her car when she arrives to HD. Last admission she was supposed to go to SNF but on day of discharge, she refused SNF placement. Very difficult situation. She is refusing to work with PT. Not clear what chance of success she will have as OP regardless of her ability to sit in chair if her home situation is not improved.  Have reached out to renal MSW.     Gaberial Cada H. Torren Maffeo NP-C 09/21/2021, 10:22 AM  [employer] [phone]

## 2021-09-21 NOTE — Plan of Care (Signed)
  Problem: Health Behavior/Discharge Planning: Goal: Ability to manage health-related needs will improve Outcome: Progressing   

## 2021-09-21 NOTE — Progress Notes (Signed)
  Progress Note    09/21/2021 11:17 AM 2 Days Post-Op  Subjective:  No issues at dialysis yesterday    Vitals:   09/21/21 0526 09/21/21 0851  BP: 131/62 (!) 136/51  Pulse: 66 72  Resp: 19 18  Temp: 98.1 F (36.7 C) 99.2 F (37.3 C)  SpO2: 100% 98%    Physical Exam: Lungs:  nonlabored Incisions:  L arm incisions c/d/l. Ace wrap currently on arm post-dialysis Extremities:  L radial pulse 2+   CBC    Component Value Date/Time   WBC 7.3 09/19/2021 1101   RBC 2.75 (L) 09/19/2021 1101   HGB 8.1 (L) 09/19/2021 1101   HGB 12.0 10/29/2018 1439   HCT 25.6 (L) 09/19/2021 1101   HCT 36.6 10/29/2018 1439   PLT 241 09/19/2021 1101   PLT 345 10/29/2018 1439   MCV 93.1 09/19/2021 1101   MCV 84 10/29/2018 1439   MCH 29.5 09/19/2021 1101   MCHC 31.6 09/19/2021 1101   RDW 16.0 (H) 09/19/2021 1101   RDW 13.4 10/29/2018 1439   LYMPHSABS 2.1 09/18/2021 0518   LYMPHSABS 2.6 10/29/2018 1439   MONOABS 1.1 (H) 09/18/2021 0518   EOSABS 0.4 09/18/2021 0518   EOSABS 0.2 10/29/2018 1439   BASOSABS 0.1 09/18/2021 0518   BASOSABS 0.1 10/29/2018 1439    BMET    Component Value Date/Time   NA 128 (L) 09/19/2021 1101   NA 140 10/29/2018 1439   K 5.5 (H) 09/19/2021 1101   CL 93 (L) 09/19/2021 1101   CO2 27 09/19/2021 1101   GLUCOSE 92 09/19/2021 1101   BUN 39 (H) 09/19/2021 1101   BUN 43 (H) 10/29/2018 1439   CREATININE 5.40 (H) 09/19/2021 1101   CREATININE 1.92 (H) 01/10/2016 1528   CALCIUM 8.9 09/19/2021 1101   CALCIUM 8.3 (L) 01/11/2020 0139   GFRNONAA 9 (L) 09/19/2021 1101   GFRNONAA 30 (L) 01/10/2016 1528   GFRAA 13 (L) 12/17/2018 0907   GFRAA 35 (L) 01/10/2016 1528    INR    Component Value Date/Time   INR 1.1 12/28/2019 1200     Intake/Output Summary (Last 24 hours) at 09/21/2021 1117 Last data filed at 09/20/2021 2131 Gross per 24 hour  Intake 854.86 ml  Output 5200 ml  Net -4345.14 ml      Assessment/Plan:  56 y.o. female is 2 day post op, s/p: left arm  fistula revision and branch ligation  -Incision is c/d/l -Palpable 2+ left radial pulse  Will sign off. Please call if questions or concerns arise   [name]  [facility] [phone] 09/21/2021 11:17 AM

## 2021-09-22 DIAGNOSIS — E11649 Type 2 diabetes mellitus with hypoglycemia without coma: Secondary | ICD-10-CM | POA: Diagnosis not present

## 2021-09-22 DIAGNOSIS — M86252 Subacute osteomyelitis, left femur: Secondary | ICD-10-CM | POA: Diagnosis not present

## 2021-09-22 DIAGNOSIS — Z992 Dependence on renal dialysis: Secondary | ICD-10-CM | POA: Diagnosis not present

## 2021-09-22 DIAGNOSIS — N186 End stage renal disease: Secondary | ICD-10-CM | POA: Diagnosis not present

## 2021-09-22 LAB — GLUCOSE, CAPILLARY
Glucose-Capillary: 61 mg/dL — ABNORMAL LOW (ref 70–99)
Glucose-Capillary: 77 mg/dL (ref 70–99)
Glucose-Capillary: 121 mg/dL — ABNORMAL HIGH (ref 70–99)
Glucose-Capillary: 132 mg/dL — ABNORMAL HIGH (ref 70–99)
Glucose-Capillary: 192 mg/dL — ABNORMAL HIGH (ref 70–99)

## 2021-09-22 NOTE — Progress Notes (Signed)
[facility] Progress Note   Subjective: Was willing to get up in chair yesterday but PT did not see her. C/O increasing edema today but denies SOB. HD tomorrow on schedule, max UF as tolerated.   Objective Vitals:   09/21/21 0851 09/21/21 1649 09/21/21 2029 09/22/21 0454  BP: (!) 136/51 (!) 144/62 (!) 133/56 (!) 148/60  Pulse: 72 69 71 67  Resp: 18 18 18 18   Temp: 99.2 F (37.3 C) 98.6 F (37 C) 98.6 F (37 C) 97.9 F (36.6 C)  TempSrc: Oral Oral Oral Oral  SpO2: 98% 98% 98% 97%  Weight:      Height:       Physical Exam General: Pleasant, chronically ill appearing female Heart:S1,S2, No M/R/G. 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 Compression hose RLE, 1+edema RUE, abd, R hip, thigh and pretib area.  Dialysis Access: L AVF +T/B     Additional Objective Labs: Basic Metabolic Panel: Recent Labs  Lab 09/16/21 0240 09/18/21 0518 09/19/21 1101  NA 124* 126* 128*  K 5.3* 4.8 5.5*  CL 89* 92* 93*  CO2 24 26 27   GLUCOSE 133* 97 92  BUN 45* 32* 39*  CREATININE 5.50* 4.62* 5.40*  CALCIUM 8.3* 8.8* 8.9  PHOS  --  4.2  --    Liver Function Tests: Recent Labs  Lab 09/18/21 0518  ALBUMIN 2.4*   No results for input(s): "LIPASE", "AMYLASE" in the last 168 hours. CBC: Recent Labs  Lab 09/16/21 0240 09/18/21 0518 09/19/21 1101  WBC 7.9 7.7 7.3  NEUTROABS  --  3.9  --   HGB 8.5* 8.4* 8.1*  HCT 26.5* 26.6* 25.6*  MCV 92.7 94.0 93.1  PLT 244 240 241   Blood Culture    Component Value Date/Time   SDES TISSUE 08/28/2021 0917   SPECREQUEST LEFT THIGH TISSUE 08/28/2021 0917   CULT  08/28/2021 0917    MODERATE ENTEROCOCCUS FAECIUM VANCOMYCIN RESISTANT ENTEROCOCCUS ISOLATED NO ANAEROBES ISOLATED Sent to [place] for further susceptibility testing. Performed at [facility], [place] [address], [place], [place] [address]    REPTSTATUS 09/02/2021 FINAL 08/28/2021 0917    Cardiac  Enzymes: Recent Labs  Lab 09/20/21 0403  CKTOTAL 31*   CBG: Recent Labs  Lab 09/21/21 1130 09/21/21 1643 09/21/21 2026 09/22/21 0718 09/22/21 0750  GLUCAP 116* 141* 146* 61* 77   Iron Studies: No results for input(s): "IRON", "TIBC", "TRANSFERRIN", "FERRITIN" in the last 72 hours. @lablastinr3 @ Studies/Results: No results found. Medications:  sodium chloride     sodium chloride Stopped (08/29/21 0139)   sodium chloride 10 mL/hr at 09/19/21 1310   DAPTOmycin (CUBICIN) 900 mg in sodium chloride 0.9 % IVPB Stopped (09/20/21 2131)   methocarbamol (ROBAXIN) IV      alosetron  1 mg Oral BID   amitriptyline  25 mg Oral QHS   amLODipine  5 mg Oral Daily   atorvastatin  40 mg Oral q1800   carvedilol  6.25 mg Oral BID   Chlorhexidine Gluconate Cloth  6 each Topical Q0600   cyclobenzaprine  10 mg Oral TID   darbepoetin (ARANESP) injection - DIALYSIS  200 mcg Intravenous Q Fri-HD   docusate sodium  100 mg Oral BID   DULoxetine  60 mg Oral Daily   famotidine  20 mg Oral Daily   furosemide  80 mg Oral Daily   heparin  5,000 Units Subcutaneous Q8H   hydrALAZINE  50 mg Oral  TID   insulin aspart  0-6 Units Subcutaneous TID WC   insulin aspart  5 Units Subcutaneous TID WC   insulin glargine-yfgn  12 Units Subcutaneous QHS   midazolam  2 mg Intravenous Once   multivitamin  1 tablet Oral QHS   pantoprazole  80 mg Oral Daily   sevelamer carbonate  1,600 mg Oral TID WC   sodium chloride flush  3 mL Intravenous Q12H     Dialysis Orders: [facility] MWF 4h  400/500  90kg  2/2 bath  P2  Heparin 4000 units IV TIW  LUA AVF   Assessment/Plan:  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 Next HD 09/23/2021 AVF Access: Issues with clots, cannulation. To OR 09/19/2021 per Dr. Virl Cagey for branch ligation. So far, working  without issues.  HTN/ volume: Better control of hypertension  Weights still up, max UF as tolerated with HD. On amlodipine 5mg  q d and carvedilol 6.25mg  bid. 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 Max ESA dose today. Transfuse if HGB <7.0.  Secondary HPTH: CCa and PO4 in range. Cont binders.  DM2: Insulin per primary Dispo: Patient has issues with no handicap ramp at her home. OP HD clinic cannot help her get in and out of her car when she arrives to HD. Last admission she was supposed to go to SNF but on day of discharge, she refused SNF placement. Very difficult situation. She is refusing to work with PT. Not clear what chance of success she will have as OP regardless of her ability to sit in chair if her home situation is not improved.  Have reached out to renal MSW.   Zylah Elsbernd H. Clark Cuff NP-C 09/22/2021, 9:03 AM  [employer] [phone]

## 2021-09-22 NOTE — Plan of Care (Signed)
  Problem: Health Behavior/Discharge Planning: Goal: Ability to manage health-related needs will improve Outcome: Progressing   

## 2021-09-22 NOTE — Progress Notes (Signed)
  Progress Note   Patient: [name] XFG:[id number] DOB: Jun 03, 1965 DOA: 08/26/2021     27 DOS: the patient was seen and examined on 09/22/2021   Brief hospital course: 56 year old woman PMH including ESRD, complicated left hip and femur issues requiring multiple surgeries, removal of hardware presented 6/26 with bloody drainage from surgical incision.  Seen by orthopedics and underwent I&D 6/28 and 7/1.  Currently unable to progress care secondary to refusal to sit in chair for HD.  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 [name] for discharge; follow-up with orthopedics as an outpatient --per ID daptomycin for 6 weeks with dialysis, stop date 8/12. Follow-up with Dr. Candiss Norse    ESRD --Dialysis MWF.  Patient needs to be able to sit in chair to tolerate outpatient dialysis for discharge --Nephrology managing --s/p fistula revision and branch ligation.   Anemia of chronic disease, component of acute blood loss anemia from wound --Status post 2 unit packed red blood cells 6/29 --Hemoglobin stable    Uncontrolled type 2 diabetes with hypoglycemia --A1c 7.1 --CBG with one low this AM, will monitor, no changes today. Continue Semglee, NovoLog, sliding scale. CBG stable  Essential hypertension --stable. Continue amlodipine, Coreg, hydralazine   Hyperlipidemia --continue Lipitor  Reinforced with patient need to participate w/ all therapy and sit for HD. She agrees.     Subjective:  Feels fine Eating ok  Physical Exam: Vitals:   09/21/21 1649 09/21/21 2029 09/22/21 0454 09/22/21 1006  BP: (!) 144/62 (!) 133/56 (!) 148/60 (!) 161/61  Pulse: 69 71 67 65  Resp: 18 18 18 19   Temp: 98.6 F (37 C) 98.6 F (37 C) 97.9 F (36.6 C) 98.5 F (36.9 C)  TempSrc: Oral Oral Oral Oral   SpO2: 98% 98% 97% 99%  Weight:      Height:       Physical Exam Vitals reviewed.  Constitutional:      General: She is not in acute distress.    Appearance: She is not ill-appearing or toxic-appearing.  Cardiovascular:     Rate and Rhythm: Normal rate and regular rhythm.     Heart sounds: No murmur heard. Musculoskeletal:     Comments: Left leg appears stable  Neurological:     Mental Status: She is alert.  Psychiatric:        Mood and Affect: Mood normal.        Behavior: Behavior normal.     Data Reviewed:  CBG stable  Family Communication: none  Disposition: Status is: Inpatient Remains inpatient appropriate because: needs to participate with PT and sit in chair for HD  Planned Discharge Destination: Skilled nursing facility recommended but patient refuses. Will go home when sitting in chair at HD    Time spent: 20 minutes  Author: Murray Hodgkins, MD 09/22/2021 4:49 PM  For on call review [email or web address].

## 2021-09-23 DIAGNOSIS — M86252 Subacute osteomyelitis, left femur: Secondary | ICD-10-CM | POA: Diagnosis not present

## 2021-09-23 DIAGNOSIS — Z992 Dependence on renal dialysis: Secondary | ICD-10-CM | POA: Diagnosis not present

## 2021-09-23 DIAGNOSIS — N186 End stage renal disease: Secondary | ICD-10-CM | POA: Diagnosis not present

## 2021-09-23 DIAGNOSIS — E11649 Type 2 diabetes mellitus with hypoglycemia without coma: Secondary | ICD-10-CM | POA: Diagnosis not present

## 2021-09-23 LAB — RENAL FUNCTION PANEL
Albumin: 2.4 g/dL — ABNORMAL LOW (ref 3.5–5.0)
Anion gap: 10 (ref 5–15)
BUN: 47 mg/dL — ABNORMAL HIGH (ref 6–20)
CO2: 23 mmol/L (ref 22–32)
Calcium: 8.3 mg/dL — ABNORMAL LOW (ref 8.9–10.3)
Chloride: 92 mmol/L — ABNORMAL LOW (ref 98–111)
Creatinine, Ser: 5.92 mg/dL — ABNORMAL HIGH (ref 0.44–1.00)
GFR, Estimated: 8 mL/min — ABNORMAL LOW (ref >60)
Glucose, Bld: 145 mg/dL — ABNORMAL HIGH (ref 70–99)
Phosphorus: 5.7 mg/dL — ABNORMAL HIGH (ref 2.5–4.6)
Potassium: 5.3 mmol/L — ABNORMAL HIGH (ref 3.5–5.1)
Sodium: 125 mmol/L — ABNORMAL LOW (ref 135–145)

## 2021-09-23 LAB — CBC
HCT: 27 % — ABNORMAL LOW (ref 36.0–46.0)
Hemoglobin: 8.6 g/dL — ABNORMAL LOW (ref 12.0–15.0)
MCH: 29.7 pg (ref 26.0–34.0)
MCHC: 31.9 g/dL (ref 30.0–36.0)
MCV: 93.1 fL (ref 80.0–100.0)
Platelets: 193 10*3/uL (ref 150–400)
RBC: 2.9 MIL/uL — ABNORMAL LOW (ref 3.87–5.11)
RDW: 15.1 % (ref 11.5–15.5)
WBC: 7.3 10*3/uL (ref 4.0–10.5)
nRBC: 0 % (ref 0.0–0.2)

## 2021-09-23 LAB — GLUCOSE, CAPILLARY
Glucose-Capillary: 130 mg/dL — ABNORMAL HIGH (ref 70–99)
Glucose-Capillary: 153 mg/dL — ABNORMAL HIGH (ref 70–99)
Glucose-Capillary: 192 mg/dL — ABNORMAL HIGH (ref 70–99)

## 2021-09-23 MED ORDER — CHLORHEXIDINE GLUCONATE CLOTH 2 % EX PADS
6.0000 | MEDICATED_PAD | Freq: Every day | CUTANEOUS | Status: DC
  Administered 2021-09-23 – 2021-09-24 (×2): 6 via TOPICAL

## 2021-09-23 MED ORDER — PENTAFLUOROPROP-TETRAFLUOROETH EX AERO: 1.0000 | INHALATION_SPRAY | CUTANEOUS | Status: DC | PRN

## 2021-09-23 MED ORDER — HEPARIN SODIUM (PORCINE) 1000 UNIT/ML DIALYSIS: 1000.0000 [IU] | INTRAMUSCULAR | Status: DC | PRN

## 2021-09-23 MED ORDER — ANTICOAGULANT SODIUM CITRATE 4% (200MG/5ML) IV SOLN: 5.0000 mL | Status: DC | PRN

## 2021-09-23 MED ORDER — ALTEPLASE 2 MG IJ SOLR: 2.0000 mg | Freq: Once | INTRAMUSCULAR | Status: DC | PRN

## 2021-09-23 NOTE — Progress Notes (Signed)
OT Cancellation Note  Patient Details Name: [name] MRN: [id number] DOB: 1965-10-23   Cancelled Treatment:    Reason Eval/Treat Not Completed: Other (comment) plan to see pt in conjunction with PT however Patient refusing to get into chair for prolonged period of dialysis for her first time in the chair. She is not sure she will be able to tolerate sitting in the chair that long. Plan to continue efforts to work with pt to reach goal of being able to tolerate OOB mobility as time allows.   Harley Alto., COTA/L Acute Rehabilitation Services [phone]   [name] 09/23/2021, 10:39 AM

## 2021-09-23 NOTE — Progress Notes (Signed)
[facility] Progress Note   Subjective:   Patient seen and examined at bedside.  Plan for dialysis today but refusing to go in chair because she does not want her 1st time sitting for a prolonged period of time to be at dialysis.  States "you never know how long you may end up sitting in there."  Agreed to sit in a chair in the room and work her way up to sitting in chair for HD.  Admits to nausea this AM after eating breakfast. Continues to have edema.  Denies CP, SOB, orthopnea, dizziness, vomiting and abdominal pain.    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 Dialysis Access: LU AVF +b/t   Filed Weights   09/20/21 0801 09/20/21 1348 09/23/21 1100  Weight: 98.4 kg 93.7 kg 100.3 kg    Intake/Output Summary (Last 24 hours) at 09/23/2021 1137 Last data filed at 09/23/2021 1000 Gross per 24 hour  Intake 1220 ml  Output 50 ml  Net 1170 ml    Additional Objective Labs: Basic Metabolic Panel: Recent Labs  Lab 09/18/21 0518 09/19/21 1101  NA 126* 128*  K 4.8 5.5*  CL 92* 93*  CO2 26 27  GLUCOSE 97 92  BUN 32* 39*  CREATININE 4.62* 5.40*  CALCIUM 8.8* 8.9  PHOS 4.2  --    Liver Function Tests: Recent Labs  Lab 09/18/21 0518  ALBUMIN 2.4*   CBC: Recent Labs  Lab 09/18/21 0518 09/19/21 1101  WBC 7.7 7.3  NEUTROABS 3.9  --   HGB 8.4* 8.1*  HCT 26.6* 25.6*  MCV 94.0 93.1  PLT 240 241    Cardiac Enzymes: Recent Labs  Lab 09/20/21 0403  CKTOTAL 31*   CBG: Recent Labs  Lab 09/22/21 0750  09/22/21 1144 09/22/21 1658 09/22/21 2032 09/23/21 0733  GLUCAP 77 121* 132* 192* 130*   Studies/Results: No results found.  Medications:  sodium chloride     sodium chloride Stopped (08/29/21 0139)   sodium chloride 10 mL/hr at 09/19/21 1310   anticoagulant sodium citrate     DAPTOmycin (CUBICIN) 900 mg in sodium chloride 0.9 % IVPB Stopped (09/20/21 2131)   methocarbamol (ROBAXIN) IV      alosetron  1 mg Oral BID   amitriptyline  25 mg Oral QHS   amLODipine  5 mg Oral Daily   atorvastatin  40 mg Oral q1800   carvedilol  6.25 mg Oral BID   Chlorhexidine Gluconate Cloth  6 each Topical Q0600   Chlorhexidine Gluconate Cloth  6 each Topical Q0600   cyclobenzaprine  10 mg Oral TID   darbepoetin (ARANESP) injection - DIALYSIS  200 mcg Intravenous Q Fri-HD   docusate sodium  100 mg Oral BID   DULoxetine  60 mg Oral Daily   famotidine  20 mg Oral Daily   furosemide  80 mg Oral Daily   heparin  5,000 Units Subcutaneous Q8H  hydrALAZINE  50 mg Oral TID   insulin aspart  0-6 Units Subcutaneous TID WC   insulin aspart  5 Units Subcutaneous TID WC   insulin glargine-yfgn  12 Units Subcutaneous QHS   midazolam  2 mg Intravenous Once   multivitamin  1 tablet Oral QHS   pantoprazole  80 mg Oral Daily   sevelamer carbonate  1,600 mg Oral TID WC   sodium chloride flush  3 mL Intravenous Q12H    Dialysis Orders: [facility] MWF 4h  400/500  90kg  2/2 bath  P2  Heparin 4000 units IV TIW  LUA AVF   Assessment/Plan:  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 Next HD today. Continue to encourage to sit in chair for HD.  AVF Access: Issues with clots, cannulation. To OR 09/19/2021 per Dr. Virl Cagey for branch ligation. So far, working without issues.  HTN/ volume: BP elevated today.  If weights correct 10kg over dry, max  UF as tolerated with HD. Remind of importance of following fluid restrictions. On amlodipine 5mg  q d and carvedilol 6.25mg  bid.  Anemia of ESRD- last HGB 8.1. s/p 2 units pRBC on 6/29. Aranesp 235mcg given 7/21.Transfuse if HGB <7.0.  Secondary HPTH: CCa and PO4 in range. Cont binders.  DM2: Insulin per primary Dispo: Patient has issues with no handicap ramp at her home. OP HD clinic cannot help her get in and out of her car when she arrives to HD. Last admission she was supposed to go to SNF but on day of discharge, she refused SNF placement. Very difficult situation. She is refusing to work with PT. Not clear what chance of success she will have as OP regardless of her ability to sit in chair if her home situation is not improved.  Have reached out to renal MSW.  Jen Mow, PA-C [facility] 09/23/2021,11:37 AM  LOS: 28 days

## 2021-09-23 NOTE — Progress Notes (Signed)
Notified RN CM and CSW this am regarding pt's home not having a ramp in order for pt to get to out-pt HD at d/c. Pt continues to decline sitting in the chair and has refused snf placement in the past as well. Clinic aware pt will need iv dapto with HD treatments at d/c and can provide this abx with treatments. Will assist as needed.   Melven Sartorius Renal Navigator [phone]

## 2021-09-23 NOTE — Progress Notes (Signed)
PT Cancellation Note  Patient Details Name: [name] MRN: [id number] DOB: [birth year]   Cancelled Treatment:    Reason Eval/Treat Not Completed: Other (comment)  Attempted to work on getting to dialysis chair for pt to go to dialysis in the chair. Patient refusing to get into chair for prolonged period of dialysis for her first time in the chair. She is not sure she will be able to tolerate sitting in the chair that long. Pt agreeable to get into chair on Tues (her non-dialysis day) to see how long she can tolerate being in the chair. Contacted hemodialysis to try to arrange borrowing the chair on a non-dialysis day. Dialysis states they cannot loan out the chair for non-dialysis days as they may need it for another patient. They recommended pt sit up in the chair in her room as it is very similar. Explained this patient is likely to refuse using any chair other than a dialysis chair and again they reported she should just try the chair in her room. PT will plan to see 7/25 with plan to get her into the recliner in her room to see how long she can tolerate being up.    [place]  Office [phone]  [name] 09/23/2021, 9:08 AM

## 2021-09-23 NOTE — Progress Notes (Signed)
  Progress Note   Patient: [name] BBC:[id number] DOB: [birth year] DOA: 08/26/2021     28 DOS: the patient was seen and examined on 09/23/2021   Brief hospital course: 56 year old woman PMH including ESRD, complicated left hip and femur issues requiring multiple surgeries, removal of hardware presented 6/26 with bloody drainage from surgical incision.  Seen by orthopedics and underwent I&D 6/28 and 7/1.  Currently unable to progress care secondary to refusal to sit in chair for HD.  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 [name] for discharge; follow-up with orthopedics as an outpatient --per ID daptomycin for 6 weeks with dialysis, stop date 8/12. Follow-up with Dr. Candiss Norse    ESRD --Dialysis MWF.  Patient needs to be able to sit in chair to tolerate outpatient dialysis for discharge --Nephrology managing --s/p fistula revision and branch ligation.   Anemia of chronic disease, component of acute blood loss anemia from wound --Status post 2 unit packed red blood cells 6/29 --Hemoglobin stable    Uncontrolled type 2 diabetes with hypoglycemia --A1c 7.1 --CBG with one low yesterday AM, will monitor, no changes today. Continue Semglee, NovoLog, sliding scale. CBG stable  Essential hypertension --stable. Continue amlodipine, Coreg, hydralazine   Hyperlipidemia --continue Lipitor     Subjective:  Feels ok, has chronic pain "all over"  Physical Exam: Vitals:   09/22/21 1006 09/22/21 2003 09/23/21 0542 09/23/21 0942  BP: (!) 161/61 (!) 156/70 (!) 162/63 (!) 165/63  Pulse: 65 66 66 66  Resp: 19 18 18 18   Temp: 98.5 F (36.9 C) (!) 97.5 F (36.4 C) 98.3 F (36.8 C) 98.1 F (36.7 C)  TempSrc: Oral Oral Oral Oral  SpO2: 99% 100% 99% 100%  Weight:      Height:        Physical Exam Vitals reviewed.  Constitutional:      General: She is not in acute distress.    Appearance: She is not ill-appearing or toxic-appearing.  Cardiovascular:     Rate and Rhythm: Normal rate and regular rhythm.     Heart sounds: No murmur heard. Pulmonary:     Effort: Pulmonary effort is normal. No respiratory distress.     Breath sounds: No wheezing, rhonchi or rales.  Musculoskeletal:     Comments: Left leg appears unchanged, wound vac dressing in place  Neurological:     Mental Status: She is alert.  Psychiatric:        Mood and Affect: Mood normal.        Behavior: Behavior normal.     Data Reviewed:  CBG stable last 24 hours  Family Communication: None  Disposition: Status is: Inpatient Remains inpatient appropriate because: not currently sitting in HD chair  Planned Discharge Destination:  SNF recommended, pt refusing wants to go home eventually, also refusing to sit for HD    Time spent: 20 minutes  Author: Murray Hodgkins, MD 09/23/2021 9:45 AM  For on call review [email or web address].

## 2021-09-23 NOTE — Progress Notes (Signed)
Received patient in bed, alert and oriented. Informed consent signed and in chart.  Time tx completed:1730  HD treatment completed. Patient tolerated well. [facility] HD  without signs and symptoms of complications. Patient transported back to the room, alert and orient and in no acute distress. Report given to bedside RN.  Total UF removed:3100  Medication given:  Post HD VS:152/65,68,100%,98.7  Post HD weight: 97.2kg

## 2021-09-24 DIAGNOSIS — N186 End stage renal disease: Secondary | ICD-10-CM | POA: Diagnosis not present

## 2021-09-24 DIAGNOSIS — M86252 Subacute osteomyelitis, left femur: Secondary | ICD-10-CM | POA: Diagnosis not present

## 2021-09-24 DIAGNOSIS — E11649 Type 2 diabetes mellitus with hypoglycemia without coma: Secondary | ICD-10-CM | POA: Diagnosis not present

## 2021-09-24 DIAGNOSIS — Z992 Dependence on renal dialysis: Secondary | ICD-10-CM | POA: Diagnosis not present

## 2021-09-24 LAB — GLUCOSE, CAPILLARY
Glucose-Capillary: 151 mg/dL — ABNORMAL HIGH (ref 70–99)
Glucose-Capillary: 158 mg/dL — ABNORMAL HIGH (ref 70–99)
Glucose-Capillary: 119 mg/dL — ABNORMAL HIGH (ref 70–99)
Glucose-Capillary: 157 mg/dL — ABNORMAL HIGH (ref 70–99)

## 2021-09-24 MED ORDER — CHLORHEXIDINE GLUCONATE CLOTH 2 % EX PADS
6.0000 | MEDICATED_PAD | Freq: Every day | CUTANEOUS | Status: DC
Start: 2021-09-25 — End: 2021-09-30
  Administered 2021-09-26 – 2021-09-29 (×3): 6 via TOPICAL

## 2021-09-24 NOTE — Progress Notes (Signed)
Occupational Therapy Treatment Patient Details Name: [name] MRN: [id number] DOB: November 20, 1965 Today's Date: 09/24/2021   History of present illness 56 y.o. female presented to ED 6/26 from dialysis with increased bloody drainage from L hip wound. 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 s/p hardware removal of left hip with partial resection of tuft tissue and femure that were nonviable on 08/10/21 Discharged home 6/23. underwent L hip debridement and wound vac placement 6/28; +Left intertrochanteric non-union,  Fracture of left distal femur  PMH: hypertension, hyperlipidemia, ESRD on HD MWF, history of left BKA in 2021, depression/anxiety, stroke, tobacco use, T2DM,  insomnia, chronic pain syndrome,   OT comments  Pt continues to state she is willing to get to chair and start increasing her endurance, but when offered, refuses states she will do it later. Pt educated in benefits of drop arm commode for home and leaning side to side for pericare. Pt completed seated grooming.  Pt states transportation from her home to HD is not an offered where she lives. Continue to recommend SNF.   Recommendations for follow up therapy are one component of a multi-disciplinary discharge planning process, led by the attending physician.  Recommendations may be updated based on patient status, additional functional criteria and insurance authorization.    Follow Up Recommendations  Skilled nursing-short term rehab (<3 hours/day)    Assistance Recommended at Discharge Intermittent Supervision/Assistance  Patient can return home with the following  A lot of help with bathing/dressing/bathroom;Assistance with cooking/housework;Assist for transportation;Help with stairs or ramp for entrance;Two people to help with walking and/or transfers   Equipment Recommendations  Other (comment) (drop arm commode)    Recommendations for Other Services PT consult    Precautions /  Restrictions Precautions Precautions: Fall;Other (comment) Precaution Comments: L BKA (baseline), L LE wound vac Restrictions Weight Bearing Restrictions: Yes LLE Weight Bearing: Non weight bearing Other Position/Activity Restrictions: L distal femur fx--non union       Mobility Bed Mobility Overal bed mobility: Needs Assistance Bed Mobility: Supine to Sit, Sit to Supine     Supine to sit: Supervision Sit to supine: Supervision   General bed mobility comments: increased time    Transfers                   General transfer comment: declined, discussed pt using steady to get to chair vs lateral transfer and remainin up for 1 hour to start     Balance Overall balance assessment: Needs assistance   Sitting balance-Leahy Scale: Good                                     ADL either performed or assessed with clinical judgement   ADL Overall ADL's : Needs assistance/impaired     Grooming: Wash/dry hands;Wash/dry face;Oral care;Sitting                                 General ADL Comments: Pt educated in availability of drop arm commode for home and leaning side to side for pericare.    Extremity/Trunk Assessment              Vision       Perception     Praxis      Cognition Arousal/Alertness: Awake/alert Behavior During Therapy: WFL for tasks assessed/performed Overall Cognitive Status: Within  Functional Limits for tasks assessed                                          Exercises      Shoulder Instructions       General Comments      Pertinent Vitals/ Pain       Pain Assessment Pain Assessment: Faces Faces Pain Scale: No hurt  Home Living                                          Prior Functioning/Environment              Frequency  Min 2X/week        Progress Toward Goals  OT Goals(current goals can now be found in the care plan section)  Progress towards OT  goals: Not progressing toward goals - comment  Acute Rehab OT Goals OT Goal Formulation: With patient Time For Goal Achievement: 10/08/21 Potential to Achieve Goals: Fair ADL Goals Pt Will Perform Grooming: with modified independence;sitting Pt Will Perform Lower Body Bathing: with min assist;sitting/lateral leans Pt Will Perform Lower Body Dressing: with min assist;sitting/lateral leans Pt Will Transfer to Toilet: bedside commode;with transfer board;with supervision Pt Will Perform Toileting - Clothing Manipulation and hygiene: with min assist;sitting/lateral leans  Plan Discharge plan remains appropriate;Frequency remains appropriate    Co-evaluation                 AM-PAC OT "6 Clicks" Daily Activity     Outcome Measure   Help from another person eating meals?: None Help from another person taking care of personal grooming?: A Little Help from another person toileting, which includes using toliet, bedpan, or urinal?: Total Help from another person bathing (including washing, rinsing, drying)?: A Lot Help from another person to put on and taking off regular upper body clothing?: A Little Help from another person to put on and taking off regular lower body clothing?: A Lot 6 Click Score: 15    End of Session    OT Visit Diagnosis: Muscle weakness (generalized) (M62.81);Unsteadiness on feet (R26.81);History of falling (Z91.81)   Activity Tolerance Patient tolerated treatment well;Patient limited by pain   Patient Left in bed;with call bell/phone within reach;with bed alarm set   Nurse Communication          Time: [phone] OT Time Calculation (min): 30 min  Charges: OT General Charges $OT Visit: 1 Visit OT Treatments $Self Care/Home Management : 23-37 mins  Cleta Alberts, OTR/L Acute Rehabilitation Services Office: [phone]   [name] 09/24/2021, 10:04 AM

## 2021-09-24 NOTE — Progress Notes (Signed)
Pharmacy Antibiotic Note  [name] is a 56 y.o. 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 Prior intra-op cultures in May grew MSSA, now with intra-op cultures this admission growing Enterococcus faecium (VRE). Pharmacy has been consulted for Daptomycin dosing.  Noted pt is ESRD and her daptomycin is given after dialysis on MWF.  Monitoring weekly CK remains within normal limits last checked on 7/21, next due 09/27/21.   WBC is within normal limits. Afebrile.  6/30 CK 21 7/7 CK 23 7/14 CK 39  7/21 31  Plan: - Continue Daptomycin 900 mg (10 mg/kg TBW) IV every MWF after HD, anticipated stop date 10/12/21 per ID - Continue with weekly CK monitoring on Fridays - Will continue to follow HD schedule/duration, LOT, and antibiotic de-escalation plans   Height: 5\' 10"  (177.8 cm) Weight: 97.2 kg (214 lb 4.6 oz) IBW/kg (Calculated) : 68.5  Temp (24hrs), Avg:98.4 F (36.9 C), Min:98.1 F (36.7 C), Max:98.7 F (37.1 C)  Recent Labs  Lab 09/18/21 0518 09/19/21 1101 09/23/21 1159 09/23/21 1200  WBC 7.7 7.3  --  7.3  CREATININE 4.62* 5.40* 5.92*  --      Estimated Creatinine Clearance: 13.7 mL/min (A) (by C-G formula based on SCr of 5.92 mg/dL (H)).    Allergies  Allergen Reactions   Trazodone Swelling   Latex Rash   Lidocaine Itching   Margarethe Virgen A. Levada Dy, PharmD, BCPS, FNKF Clinical Pharmacist [place] Please utilize Amion for appropriate phone number to reach the unit pharmacist ([place])   09/24/2021 7:47 AM

## 2021-09-24 NOTE — Progress Notes (Signed)
[facility] Progress Note   Subjective:   Patient seen and examined at bedside.  Plans to sit in bedside chair today in room.  Reports alarming a lot during dialysis yesterday, but otherwise went well.  Denies CP, SOB, abdominal pain and n/v/d.  Discussed elevating her RUE to help improve edema.   Objective Vitals:   09/23/21 1826 09/23/21 2044 09/24/21 0501 09/24/21 0935  BP: (!) 154/56 119/66 (!) 134/56 (!) 150/55  Pulse: 71 72 65 65  Resp: 18 18 19 17   Temp: 98.3 F (36.8 C) 98.7 F (37.1 C) 98.3 F (36.8 C) 98.1 F (36.7 C)  TempSrc: Oral     SpO2: 100% 98% 97% 100%  Weight:      Height:       Physical Exam General:chronically ill appearing female in NAD Heart:RRR, no mrg Lungs:CTAB, nml WOB Abdomen:soft, NTND Extremities:1+ LE edema b/l, L BKA, wound vac in place on L hip, 1+ RUE edema Dialysis Access: LU AVF +b/t   Filed Weights   09/20/21 1348 09/23/21 1100 09/23/21 1730  Weight: 93.7 kg 100.3 kg 97.2 kg    Intake/Output Summary (Last 24 hours) at 09/24/2021 1237 Last data filed at 09/24/2021 1233 Gross per 24 hour  Intake 300 ml  Output 604.1 ml  Net -304.1 ml    Additional Objective Labs: Basic Metabolic Panel: Recent Labs  Lab 09/18/21 0518 09/19/21 1101 09/23/21 1159  NA 126* 128* 125*  K 4.8 5.5* 5.3*  CL 92* 93* 92*  CO2 26 27 23   GLUCOSE 97 92 145*  BUN 32* 39* 47*  CREATININE 4.62* 5.40* 5.92*  CALCIUM 8.8* 8.9 8.3*  PHOS 4.2  --  5.7*   Liver Function Tests: Recent Labs  Lab 09/18/21 0518 09/23/21 1159  ALBUMIN 2.4* 2.4*   CBC: Recent Labs  Lab 09/18/21 0518 09/19/21 1101 09/23/21 1200  WBC 7.7 7.3 7.3  NEUTROABS 3.9  --   --   HGB 8.4* 8.1* 8.6*  HCT 26.6* 25.6* 27.0*  MCV 94.0 93.1 93.1  PLT 240 241 193   Cardiac Enzymes: Recent Labs  Lab 09/20/21 0403  CKTOTAL 31*   CBG: Recent Labs  Lab 09/23/21 0733 09/23/21 1827 09/23/21 2045 09/24/21 0724 09/24/21 1136  GLUCAP 130* 153* 192* 151* 158*    Medications:  sodium chloride     sodium chloride Stopped (08/29/21 0139)   sodium chloride 10 mL/hr at 09/19/21 1310   DAPTOmycin (CUBICIN) 900 mg in sodium chloride 0.9 % IVPB Stopped (09/23/21 2049)   methocarbamol (ROBAXIN) IV      alosetron  1 mg Oral BID   amitriptyline  25 mg Oral QHS   amLODipine  5 mg Oral Daily   atorvastatin  40 mg Oral q1800   carvedilol  6.25 mg Oral BID   Chlorhexidine Gluconate Cloth  6 each Topical Q0600   Chlorhexidine Gluconate Cloth  6 each Topical Q0600   cyclobenzaprine  10 mg Oral TID   darbepoetin (ARANESP) injection - DIALYSIS  200 mcg Intravenous Q Fri-HD   docusate sodium  100 mg Oral BID   DULoxetine  60 mg Oral Daily   famotidine  20 mg Oral Daily   furosemide  80 mg Oral Daily   heparin  5,000 Units Subcutaneous Q8H   hydrALAZINE  50 mg Oral TID   insulin aspart  0-6 Units Subcutaneous TID WC   insulin aspart  5 Units Subcutaneous TID WC   insulin glargine-yfgn  12 Units Subcutaneous QHS  midazolam  2 mg Intravenous Once   multivitamin  1 tablet Oral QHS   pantoprazole  80 mg Oral Daily   sevelamer carbonate  1,600 mg Oral TID WC   sodium chloride flush  3 mL Intravenous Q12H    Dialysis Orders: [facility] MWF 4h  400/500  90kg  2/2 bath  P2  Heparin 4000 units IV TIW  LUA AVF   Assessment/Plan:  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 Next HD tomorrow. Continue to encourage to sit in chair for HD.  AVF Access: Issues with clots, cannulation. To OR 09/19/2021 per Dr. Virl Cagey for branch ligation. Alarmed again yesterday but able to complete treatment, will see how tomorrow goes.  HTN/ volume: BP elevated today.  If weights correct 10kg over dry, max UF as tolerated with HD. Remind of importance of following fluid restrictions. Increase amlodipine to 10mg  qd  and continue carvedilol 6.25mg  bid.  Anemia of ESRD- last HGB 8.6. s/p 2 units pRBC on 6/29. Aranesp 289mcg given 7/21.Transfuse if HGB <7.0.  Secondary HPTH: CCa and PO4 in range. Cont binders.  DM2: Insulin per primary Dispo: Patient has issues with no handicap ramp at her home. OP HD clinic cannot help her get in and out of her car when she arrives to HD. SNF recommended.  Last admission she was supposed to go to SNF but on day of discharge, she refused SNF placement. Very difficult situation.  Not clear what chance of success she will have as OP regardless of her ability to sit in chair if her home situation is not improved.  Have reached out to renal MSW.  Jen Mow, PA-C [facility] 09/24/2021,12:37 PM  LOS: 29 days

## 2021-09-24 NOTE — TOC Progression Note (Signed)
Transition of Care [facility]) - Progression Note    Patient Details  Name: [name] MRN: [id number] Date of Birth: Jan 13, 1966  Transition of [facility] CM/SW Contact  Tom-Johnson, Renea Ee, RN Phone Number: 09/24/2021, 1:01 PM  Clinical Narrative:     Patient continues to decline sitting up in chair for dialysis and PT/OT. CIR following . Patient has to be able to sit up in chair before they can assess for admission.  CM will continue to follow with needs.    Expected Discharge Plan: [place] Barriers to Discharge: Continued Medical Work up  Expected Discharge Plan and Services Expected Discharge Plan: [place]   Discharge Planning Services: CM Consult   Living arrangements for the past 2 months: Single Family Home                                       Social Determinants of Health (SDOH) Interventions    Readmission Risk Interventions    07/26/2021    3:15 PM  Readmission Risk Prevention Plan  Transportation Screening Complete  PCP or Specialist Appt within 3-5 Days Complete  HRI or Home Care Consult Complete  Palliative Care Screening Not Applicable  Medication Review (RN Care Manager) Referral to Pharmacy

## 2021-09-24 NOTE — Progress Notes (Signed)
Physical Therapy Treatment Patient Details Name: [name] MRN: [id number] DOB: 11/02/1965 Today's Date: 09/24/2021   History of Present Illness 56 y.o. female presented to ED 6/26 from dialysis with increased bloody drainage from L hip wound. 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 s/p hardware removal of left hip with partial resection of tuft tissue and femure that were nonviable on 08/10/21 Discharged home 6/23. underwent L hip debridement and wound vac placement 6/28; +Left intertrochanteric non-union,  Fracture of left distal femur  PMH: hypertension, hyperlipidemia, ESRD on HD MWF, history of left BKA in 2021, depression/anxiety, stroke, tobacco use, T2DM,  insomnia, chronic pain syndrome,    PT Comments    Patient told earlier in the day what time to expect PT and she was pre-medicated and ready to work with [place]. Attempted sit to stand from elevated bed x 4 reps with pt unable to clear hips off the mattress. Patient very disappointed that she could not stand and became tearful. Discussed importance of continuing to do her exercises every day. Discussed possibility of using a tilt bed to work on getting her up on her right leg. Explained benefits and pt willing to attempt tilt bed. Will need to determine if tilt bed can be ordered for 59M unit. Neither Director or Asst Director present at this time.    Recommendations for follow up therapy are one component of a multi-disciplinary discharge planning process, led by the attending physician.  Recommendations may be updated based on patient status, additional functional criteria and insurance authorization.  Follow Up Recommendations  Skilled nursing-short term rehab (<3 hours/day) Can patient physically be transported by private vehicle: No   Assistance Recommended at Discharge Intermittent Supervision/Assistance  Patient can return home with the following A lot of help with  bathing/dressing/bathroom;Assistance with cooking/housework;Assist for transportation;Two people to help with walking and/or transfers;Help with stairs or ramp for entrance   Equipment Recommendations  Other (comment) (Seems well-equipped; agree with looking into medical [name] transport for HD)    Recommendations for Other Services       Precautions / Restrictions Precautions Precautions: Fall;Other (comment) Precaution Comments: L BKA (baseline), L LE wound vac Restrictions Weight Bearing Restrictions: Yes LLE Weight Bearing: Non weight bearing Other Position/Activity Restrictions: L distal femur fx--non union     Mobility  Bed Mobility Overal bed mobility: Needs Assistance Bed Mobility: Supine to Sit, Sit to Supine Rolling: Modified independent (Device/Increase time) (use of rails) Sidelying to sit: Min assist, HOB elevated   Sit to supine: Supervision   General bed mobility comments: increased time and effort; assist to raise torso to sit    Transfers Overall transfer level: Needs assistance Equipment used: Rolling walker (2 wheels) Transfers: Sit to/from Stand Sit to Stand: +2 physical assistance          Lateral/Scoot Transfers: Min assist General transfer comment: attempted sit to stand from elevated bed x 4 with +2 assist and unable to clear buttocks off bed; lateral scoot to [facility] prior to return to supine    Ambulation/Gait               General Gait Details: non ambulatory at baseline   Stairs             Wheelchair Mobility    Modified Rankin (Stroke Patients Only)       Balance Overall balance assessment: Needs assistance Sitting-balance support: Feet supported Sitting balance-Leahy Scale: Good Sitting balance - Comments: ability to transfer weight left  and right in sitting                                    Cognition Arousal/Alertness: Awake/alert Behavior During Therapy: WFL for tasks assessed/performed Overall  Cognitive Status: Within Functional Limits for tasks assessed                           Safety/Judgement: Decreased awareness of deficits     General Comments: Needs extra time to allow caregivers to truly listen to her        Exercises General Exercises - Lower Extremity Long Arc Quad: Right, 10 reps, Seated Other Exercises Other Exercises: LLE internal rotation x 10 reps with pt unable to achieve neutral rotation (tight external rotators)    General Comments General comments (skin integrity, edema, etc.): Pt reports she has been working on her exercises daily. Emphasized that she needs to work on bridging with RLE for strengthening for standing.      Pertinent Vitals/Pain Pain Assessment Pain Assessment: Faces Faces Pain Scale: Hurts even more Breathing: normal Negative Vocalization: none Facial Expression: smiling or inexpressive Body Language: relaxed Consolability: no need to console PAINAD Score: 0 Pain Location: L knee, rt knee Pain Descriptors / Indicators: Discomfort Pain Intervention(s): Limited activity within patient's tolerance, Monitored during session, Premedicated before session    Home Living                          Prior Function            PT Goals (current goals can now be found in the care plan section) Acute Rehab PT Goals Patient Stated Goal: less pain with movement Time For Goal Achievement: 09/26/21 Potential to Achieve Goals: Fair Progress towards PT goals: Not progressing toward goals - comment (too weak to come to stand today)    Frequency    Min 1X/week      PT Plan Discharge plan needs to be updated    Co-evaluation              AM-PAC PT "6 Clicks" Mobility   Outcome Measure  Help needed turning from your back to your side while in a flat bed without using bedrails?: None Help needed moving from lying on your back to sitting on the side of a flat bed without using bedrails?: A Little Help  needed moving to and from a bed to a chair (including a wheelchair)?: Total Help needed standing up from a chair using your arms (e.g., wheelchair or bedside chair)?: Total Help needed to walk in hospital room?: Total Help needed climbing 3-5 steps with a railing? : Total 6 Click Score: 11    End of Session Equipment Utilized During Treatment: Other (comment);Gait belt (bed pad) Activity Tolerance: Patient tolerated treatment well Patient left: in bed;with call bell/phone within reach   PT Visit Diagnosis: Other abnormalities of gait and mobility (R26.89);Muscle weakness (generalized) (M62.81);History of falling (Z91.81);Pain Pain - Right/Left: Left Pain - part of body: Leg     Time: 1430-1504 PT Time Calculation (min) (ACUTE ONLY): 34 min  Charges:  $Therapeutic Activity: 23-37 mins                      Arby Barrette, PT Acute Rehabilitation Services  Office [phone]    [name] 09/24/2021, 3:54 PM

## 2021-09-24 NOTE — Progress Notes (Addendum)
  Progress Note   Patient: [name] RKY:[id number] DOB: [birth year] DOA: 08/26/2021     29 DOS: the patient was seen and examined on 09/24/2021   Brief hospital course: 56 year old woman PMH including ESRD, complicated left hip and femur issues requiring multiple surgeries, removal of hardware presented 6/26 with bloody drainage from surgical incision.  Seen by orthopedics and underwent I&D 6/28 and 7/1.  Currently unable to progress care secondary to refusal to sit in chair for HD.  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 [name] for discharge; follow-up with orthopedics as an outpatient --per ID daptomycin for 6 weeks with dialysis, stop date 8/12. Follow-up with Dr. Candiss Norse    ESRD --Dialysis MWF.  Patient needs to be able to sit in chair to tolerate outpatient dialysis for discharge --Nephrology managing --s/p fistula revision and branch ligation.   Anemia of chronic disease, component of acute blood loss anemia from wound --Status post 2 unit packed red blood cells 6/29 --Hemoglobin stable    Uncontrolled type 2 diabetes with hypoglycemia --A1c 7.1 --CBG stable. Continue Semglee, NovoLog, sliding scale. CBG stable  Essential hypertension --stable. Continue amlodipine, Coreg, hydralazine   Hyperlipidemia --continue Lipitor      Subjective:  Feels ok States she will sit in chair today  Physical Exam: Vitals:   09/23/21 1826 09/23/21 2044 09/24/21 0501 09/24/21 0935  BP: (!) 154/56 119/66 (!) 134/56 (!) 150/55  Pulse: 71 72 65 65  Resp: 18 18 19 17   Temp: 98.3 F (36.8 C) 98.7 F (37.1 C) 98.3 F (36.8 C) 98.1 F (36.7 C)  TempSrc: Oral     SpO2: 100% 98% 97% 100%  Weight:      Height:       Physical Exam Vitals reviewed.  Constitutional:      General: She  is not in acute distress.    Appearance: She is not ill-appearing or toxic-appearing.  Cardiovascular:     Rate and Rhythm: Normal rate and regular rhythm.     Heart sounds: No murmur heard. Pulmonary:     Effort: Pulmonary effort is normal. No respiratory distress.     Breath sounds: No wheezing, rhonchi or rales.  Skin:    Comments: Left thigh appears without significant change  Neurological:     Mental Status: She is alert.  Psychiatric:        Mood and Affect: Mood normal.        Behavior: Behavior normal.     Data Reviewed:  CBG stable  Family Communication: none  Disposition: Status is: Inpatient Remains inpatient appropriate because: still not sitting for HD  Planned Discharge Destination: Skilled nursing facility recommended but patient wants to go home    Time spent: 20 minutes  Author: Murray Hodgkins, MD 09/24/2021 1:26 PM  For on call review [email or web address].

## 2021-09-25 DIAGNOSIS — E785 Hyperlipidemia, unspecified: Secondary | ICD-10-CM | POA: Diagnosis not present

## 2021-09-25 DIAGNOSIS — S72142S Displaced intertrochanteric fracture of left femur, sequela: Secondary | ICD-10-CM | POA: Diagnosis not present

## 2021-09-25 DIAGNOSIS — F419 Anxiety disorder, unspecified: Secondary | ICD-10-CM | POA: Diagnosis not present

## 2021-09-25 DIAGNOSIS — M86252 Subacute osteomyelitis, left femur: Secondary | ICD-10-CM | POA: Diagnosis not present

## 2021-09-25 LAB — RENAL FUNCTION PANEL
Albumin: 2.7 g/dL — ABNORMAL LOW (ref 3.5–5.0)
Anion gap: 10 (ref 5–15)
BUN: 42 mg/dL — ABNORMAL HIGH (ref 6–20)
CO2: 27 mmol/L (ref 22–32)
Calcium: 8.7 mg/dL — ABNORMAL LOW (ref 8.9–10.3)
Chloride: 92 mmol/L — ABNORMAL LOW (ref 98–111)
Creatinine, Ser: 5.36 mg/dL — ABNORMAL HIGH (ref 0.44–1.00)
GFR, Estimated: 9 mL/min — ABNORMAL LOW (ref >60)
Glucose, Bld: 131 mg/dL — ABNORMAL HIGH (ref 70–99)
Phosphorus: 4.8 mg/dL — ABNORMAL HIGH (ref 2.5–4.6)
Potassium: 5.1 mmol/L (ref 3.5–5.1)
Sodium: 129 mmol/L — ABNORMAL LOW (ref 135–145)

## 2021-09-25 LAB — CBC
HCT: 28.5 % — ABNORMAL LOW (ref 36.0–46.0)
Hemoglobin: 8.9 g/dL — ABNORMAL LOW (ref 12.0–15.0)
MCH: 29.4 pg (ref 26.0–34.0)
MCHC: 31.2 g/dL (ref 30.0–36.0)
MCV: 94.1 fL (ref 80.0–100.0)
Platelets: 199 10*3/uL (ref 150–400)
RBC: 3.03 MIL/uL — ABNORMAL LOW (ref 3.87–5.11)
RDW: 15.8 % — ABNORMAL HIGH (ref 11.5–15.5)
WBC: 7.2 10*3/uL (ref 4.0–10.5)
nRBC: 0 % (ref 0.0–0.2)

## 2021-09-25 LAB — GLUCOSE, CAPILLARY
Glucose-Capillary: 123 mg/dL — ABNORMAL HIGH (ref 70–99)
Glucose-Capillary: 160 mg/dL — ABNORMAL HIGH (ref 70–99)
Glucose-Capillary: 160 mg/dL — ABNORMAL HIGH (ref 70–99)

## 2021-09-25 MED ORDER — AMLODIPINE BESYLATE 10 MG PO TABS
10.0000 mg | ORAL_TABLET | ORAL | Status: DC
  Administered 2021-09-25 – 2021-12-14 (×81): 10 mg via ORAL
  Filled 2021-09-25 (×81): qty 1

## 2021-09-25 MED ORDER — HEPARIN SODIUM (PORCINE) 1000 UNIT/ML DIALYSIS
2500.0000 [IU] | INTRAMUSCULAR | Status: DC | PRN
  Administered 2021-09-25: 2500 [IU] via INTRAVENOUS_CENTRAL
  Filled 2021-09-25: qty 3

## 2021-09-25 MED ORDER — PENTAFLUOROPROP-TETRAFLUOROETH EX AERO: 1.0000 | INHALATION_SPRAY | CUTANEOUS | Status: DC | PRN

## 2021-09-25 NOTE — Progress Notes (Signed)
[facility] Progress Note   Subjective:   Patient seen and examined at bedside in dialysis.  Tolerating treatment well so far.  Denies CP, SOB, abdominal pain and n/v/d.  Reports she tried to get in bedside chair with PT but was unable to stand. May be getting tilt bed to assist with this.    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, 1+ RUE edema Dialysis Access: LU AVF in use   Filed Weights   09/23/21 1100 09/23/21 1730 09/25/21 0925  Weight: 100.3 kg 97.2 kg 100.6 kg    Intake/Output Summary (Last 24 hours) at 09/25/2021 1247 Last data filed at 09/25/2021 0800 Gross per 24 hour  Intake 1460 ml  Output 400 ml  Net 1060 ml    Additional Objective Labs: Basic Metabolic Panel: Recent Labs  Lab 09/19/21 1101 09/23/21 1159 09/25/21 0717  NA 128* 125* 129*  K 5.5* 5.3* 5.1  CL 93* 92* 92*  CO2 27 23 27   GLUCOSE 92 145* 131*  BUN 39* 47* 42*  CREATININE 5.40* 5.92* 5.36*  CALCIUM 8.9 8.3* 8.7*  PHOS  --  5.7* 4.8*   Liver Function Tests: Recent Labs  Lab 09/23/21 1159 09/25/21 0717  ALBUMIN 2.4* 2.7*   CBC: Recent Labs  Lab 09/19/21 1101 09/23/21 1200 09/25/21 0717  WBC 7.3 7.3 7.2  HGB 8.1* 8.6* 8.9*  HCT 25.6* 27.0* 28.5*  MCV 93.1 93.1 94.1  PLT 241 193 199   Cardiac Enzymes: Recent Labs  Lab 09/20/21 0403  CKTOTAL 31*   CBG: Recent Labs  Lab 09/24/21 0724 09/24/21 1136 09/24/21 1640 09/24/21 2145 09/25/21 0728  GLUCAP 151* 158* 119* 157* 123*    Medications:  sodium chloride     sodium chloride Stopped (08/29/21 0139)   sodium chloride 10 mL/hr at  09/19/21 1310   DAPTOmycin (CUBICIN) 900 mg in sodium chloride 0.9 % IVPB Stopped (09/23/21 2049)   methocarbamol (ROBAXIN) IV      alosetron  1 mg Oral BID   amitriptyline  25 mg Oral QHS   amLODipine  5 mg Oral Daily   atorvastatin  40 mg Oral q1800   carvedilol  6.25 mg Oral BID   Chlorhexidine Gluconate Cloth  6 each Topical Q0600   cyclobenzaprine  10 mg Oral TID   darbepoetin (ARANESP) injection - DIALYSIS  200 mcg Intravenous Q Fri-HD   docusate sodium  100 mg Oral BID   DULoxetine  60 mg Oral Daily   famotidine  20 mg Oral Daily   furosemide  80 mg Oral Daily   heparin  5,000 Units Subcutaneous Q8H   hydrALAZINE  50 mg Oral TID   insulin aspart  0-6 Units Subcutaneous TID WC   insulin aspart  5 Units Subcutaneous TID WC   insulin glargine-yfgn  12 Units Subcutaneous QHS   multivitamin  1 tablet Oral QHS   pantoprazole  80 mg Oral Daily   sevelamer carbonate  1,600 mg Oral TID  WC   sodium chloride flush  3 mL Intravenous Q12H    Dialysis Orders: [facility] MWF 4h  400/500  90kg  2/2 bath  P2  Heparin 4000 units IV TIW  LUA AVF   Assessment/Plan:  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 HD today. Continue to encourage to sit in chair for HD.  AVF Access: Issues with clots, cannulation. To OR 09/19/2021 per Dr. Virl Cagey for branch ligation. Alarmed last HD but able to complete treatment.  HTN/ volume: BP elevated.  Remains volume overloaded.  If weights correct 10kg over dry, max UF as tolerated with HD. Remind of importance of following fluid restrictions. Increased amlodipine to 10mg  qd and continue carvedilol 6.25mg  bid.  Anemia of ESRD- last HGB 8.9. s/p 2 units pRBC on 6/29. Aranesp 273mcg given 7/21.Transfuse if HGB <7.0.  Secondary HPTH: CCa and PO4 in range. Cont binders.  DM2: Insulin per  primary Dispo: Patient has issues with no handicap ramp at her home. OP HD clinic cannot help her get in and out of her car when she arrives to HD. SNF recommended.  Last admission she was supposed to go to SNF but on day of discharge, she refused SNF placement. Very difficult situation.  Not clear what chance of success she will have as OP regardless of her ability to sit in chair if her home situation is not improved.  Have reached out to renal MSW.  Jen Mow, PA-C [facility] 09/25/2021,12:47 PM  LOS: 30 days

## 2021-09-25 NOTE — Progress Notes (Signed)
Received patient in bed, alert and oriented. Informed consent signed and in chart.  Time tx completed: 1400  HD treatment completed. Patient tolerated well. LUA Fistula without signs and symptoms of complications. Patient transported back to the room, alert and orient and in no acute distress. Report given to bedside RN.  Total UF removed: 5000 mL Blood Volume Processed: 87.5L  Medication given: n/a  Post HD VS: 149/69, 10, 100%, 68, 5L, 97.9  Post HD weight: 96.3kg

## 2021-09-25 NOTE — Progress Notes (Signed)
PROGRESS NOTE    [name]  AST:[id number] DOB: 1965/07/12 DOA: 08/26/2021 PCP: [name], MD    Brief Narrative:  56 year old woman PMH including ESRD, complicated left hip and femur issues requiring multiple surgeries, removal of hardware presented 6/26 with bloody drainage from surgical incision.  Seen by orthopedics and underwent I&D 6/28 and 7/1.  Currently unable to progress care secondary to refusal to sit in chair for HD.    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  Active Problems:   Closed intertrochanteric fracture of hip, left, sequela   ESRD (end stage renal disease) on dialysis ([place])   Uncontrolled type 2 diabetes mellitus with hypoglycemia, with long-term current use of insulin (HCC)   History of CVA (cerebrovascular accident)   Benign essential HTN   Dyslipidemia, goal LDL below 100   Anxiety and depression   GASTROESOPHAGEAL REFLUX, NO ESOPHAGITIS   TOBACCO DEPENDENCE   Wound infection   Iron deficiency anemia, unspecified   Wound dehiscence, surgical, sequela   Left femoral shaft fracture (HCC)  Subacute osteomyelitis of the left femur with abscess, infected left hip hardware status post removal, left intertrochanteric fracture --Wound VAC to the left outer thigh per orthopedics, continue wound care, cleared by Dr. [name] for discharge; follow-up with orthopedics as an outpatient --per ID daptomycin for 6 weeks with dialysis, stop date 8/12. Follow-up with Dr. Candiss Norse    ESRD --Dialysis MWF.  Patient needs to be able to sit in chair to tolerate outpatient dialysis for discharge --Nephrology managing --s/p fistula revision and branch ligation. -Reports not tolerating sitting in chair thus far   Anemia of chronic disease, component of acute blood loss anemia from wound --Status post 2 unit packed red blood cells 6/29 --Hemoglobin stable    Uncontrolled type 2 diabetes with hypoglycemia --A1c 7.1 --CBG  stable. Continue Semglee, NovoLog, sliding scale. CBG stable  Essential hypertension --stable. Continue amlodipine, Coreg, hydralazine   Hyperlipidemia --continue Lipitor    DVT prophylaxis: heparin subq Code Status: Full Family Communication: Pt in room, family not at bedside  Status is: Inpatient Remains inpatient appropriate because: Severity of illness   Consultants:  Nephrology Orthopedic Surgery  Antimicrobials: Anti-infectives (From admission, onward)    Start     Dose/Rate Route Frequency Ordered Stop   09/02/21 2000  DAPTOmycin (CUBICIN) 900 mg in sodium chloride 0.9 % IVPB        10 mg/kg  92.4 kg 136 mL/hr over 30 Minutes Intravenous Once per day on Mon Wed Fri 09/02/21 0836 10/12/21 2359   08/31/21 0745  ceFAZolin (ANCEF) IVPB 2g/100 mL premix        2 g 200 mL/hr over 30 Minutes Intravenous To Short Stay 08/31/21 0730 08/31/21 0918   08/30/21 0600  ceFAZolin (ANCEF) IVPB 2g/100 mL premix  Status:  Discontinued        2 g 200 mL/hr over 30 Minutes Intravenous To Short Stay 08/30/21 0123 08/31/21 0600   08/29/21 2000  DAPTOmycin (CUBICIN) 900 mg in sodium chloride 0.9 % IVPB  Status:  Discontinued        10 mg/kg  92.4 kg 136 mL/hr over 30 Minutes Intravenous Every 48 hours 08/29/21 1627 09/02/21 0836   08/28/21 1800  ceFAZolin (ANCEF) IVPB 2g/100 mL premix  Status:  Discontinued        2 g 200 mL/hr over 30 Minutes Intravenous Every M-W-F (1800) 08/27/21 0846 08/29/21 1620   08/28/21 1200  ceFAZolin (  ANCEF) IVPB 1 g/50 mL premix  Status:  Discontinued        1 g 100 mL/hr over 30 Minutes Intravenous Every M-W-F (Hemodialysis) 08/26/21 1954 08/27/21 0846   08/28/21 0600  ceFAZolin (ANCEF) IVPB 2g/100 mL premix        2 g 200 mL/hr over 30 Minutes Intravenous On call to O.R. 08/27/21 1150 08/28/21 0909   08/27/21 1800  ceFAZolin (ANCEF) IVPB 1 g/50 mL premix        1 g 100 mL/hr over 30 Minutes Intravenous  Once 08/26/21 1954 08/27/21 2048   08/26/21 1500   ceFEPIme (MAXIPIME) 1 g in sodium chloride 0.9 % 100 mL IVPB  Status:  Discontinued        1 g 200 mL/hr over 30 Minutes Intravenous Every 24 hours 08/26/21 1452 08/26/21 1714       Subjective: Reports not tolerating sitting in chair yesterday. Seen on HD today. Without complaints  Objective: Vitals:   09/25/21 1230 09/25/21 1312 09/25/21 1330 09/25/21 1357  BP: (!) 154/93 (!) 117/92 (!) 145/61 (!) 149/69  Pulse: 66 67  68  Resp: 13 14  10   Temp:    97.9 F (36.6 C)  TempSrc:      SpO2: 100% 100%  100%  Weight:    96.3 kg  Height:        Intake/Output Summary (Last 24 hours) at 09/25/2021 1448 Last data filed at 09/25/2021 1357 Gross per 24 hour  Intake 1160 ml  Output 5400 ml  Net -4240 ml   Filed Weights   09/23/21 1730 09/25/21 0925 09/25/21 1357  Weight: 97.2 kg 100.6 kg 96.3 kg    Examination: General exam: Awake, laying in bed, in nad Respiratory system: Normal respiratory effort, no wheezing Cardiovascular system: regular rate, s1, s2 Gastrointestinal system: Soft, nondistended, positive BS Central nervous system: CN2-12 grossly intact, strength intact Extremities: Perfused, no clubbing Skin: Normal skin turgor, no notable skin lesions seen Psychiatry: Mood normal // no visual hallucinations   Data Reviewed: I have personally reviewed following labs and imaging studies  CBC: Recent Labs  Lab 09/19/21 1101 09/23/21 1200 09/25/21 0717  WBC 7.3 7.3 7.2  HGB 8.1* 8.6* 8.9*  HCT 25.6* 27.0* 28.5*  MCV 93.1 93.1 94.1  PLT 241 193 294   Basic Metabolic Panel: Recent Labs  Lab 09/19/21 1101 09/23/21 1159 09/25/21 0717  NA 128* 125* 129*  K 5.5* 5.3* 5.1  CL 93* 92* 92*  CO2 27 23 27   GLUCOSE 92 145* 131*  BUN 39* 47* 42*  CREATININE 5.40* 5.92* 5.36*  CALCIUM 8.9 8.3* 8.7*  PHOS  --  5.7* 4.8*   GFR: Estimated Creatinine Clearance: 15.1 mL/min (A) (by C-G formula based on SCr of 5.36 mg/dL (H)). Liver Function Tests: Recent Labs  Lab  09/23/21 1159 09/25/21 0717  ALBUMIN 2.4* 2.7*   No results for input(s): "LIPASE", "AMYLASE" in the last 168 hours. No results for input(s): "AMMONIA" in the last 168 hours. Coagulation Profile: No results for input(s): "INR", "PROTIME" in the last 168 hours. Cardiac Enzymes: Recent Labs  Lab 09/20/21 0403  CKTOTAL 31*   BNP (last 3 results) No results for input(s): "PROBNP" in the last 8760 hours. HbA1C: No results for input(s): "HGBA1C" in the last 72 hours. CBG: Recent Labs  Lab 09/24/21 0724 09/24/21 1136 09/24/21 1640 09/24/21 2145 09/25/21 0728  GLUCAP 151* 158* 119* 157* 123*   Lipid Profile: No results for input(s): "CHOL", "HDL", "LDLCALC", "TRIG", "CHOLHDL", "  LDLDIRECT" in the last 72 hours. Thyroid Function Tests: No results for input(s): "TSH", "T4TOTAL", "FREET4", "T3FREE", "THYROIDAB" in the last 72 hours. Anemia Panel: No results for input(s): "VITAMINB12", "FOLATE", "FERRITIN", "TIBC", "IRON", "RETICCTPCT" in the last 72 hours. Sepsis Labs: No results for input(s): "PROCALCITON", "LATICACIDVEN" in the last 168 hours.  No results found for this or any previous visit (from the past 240 hour(s)).   Radiology Studies: No results found.  Scheduled Meds:  alosetron  1 mg Oral BID   amitriptyline  25 mg Oral QHS   amLODipine  10 mg Oral Daily   atorvastatin  40 mg Oral q1800   carvedilol  6.25 mg Oral BID   Chlorhexidine Gluconate Cloth  6 each Topical Q0600   cyclobenzaprine  10 mg Oral TID   darbepoetin (ARANESP) injection - DIALYSIS  200 mcg Intravenous Q Fri-HD   docusate sodium  100 mg Oral BID   DULoxetine  60 mg Oral Daily   famotidine  20 mg Oral Daily   furosemide  80 mg Oral Daily   heparin  5,000 Units Subcutaneous Q8H   hydrALAZINE  50 mg Oral TID   insulin aspart  0-6 Units Subcutaneous TID WC   insulin aspart  5 Units Subcutaneous TID WC   insulin glargine-yfgn  12 Units Subcutaneous QHS   multivitamin  1 tablet Oral QHS    pantoprazole  80 mg Oral Daily   sevelamer carbonate  1,600 mg Oral TID WC   sodium chloride flush  3 mL Intravenous Q12H   Continuous Infusions:  sodium chloride     sodium chloride Stopped (08/29/21 0139)   sodium chloride 10 mL/hr at 09/19/21 1310   DAPTOmycin (CUBICIN) 900 mg in sodium chloride 0.9 % IVPB Stopped (09/23/21 2049)   methocarbamol (ROBAXIN) IV       LOS: 30 days   Marylu Lund, MD [facility] Pager On Amion  If 7PM-7AM, please contact night-coverage 09/25/2021, 2:48 PM

## 2021-09-26 DIAGNOSIS — E785 Hyperlipidemia, unspecified: Secondary | ICD-10-CM | POA: Diagnosis not present

## 2021-09-26 DIAGNOSIS — I10 Essential (primary) hypertension: Secondary | ICD-10-CM | POA: Diagnosis not present

## 2021-09-26 DIAGNOSIS — M86252 Subacute osteomyelitis, left femur: Secondary | ICD-10-CM | POA: Diagnosis not present

## 2021-09-26 LAB — CBC
HCT: 29 % — ABNORMAL LOW (ref 36.0–46.0)
Hemoglobin: 9.2 g/dL — ABNORMAL LOW (ref 12.0–15.0)
MCH: 30 pg (ref 26.0–34.0)
MCHC: 31.7 g/dL (ref 30.0–36.0)
MCV: 94.5 fL (ref 80.0–100.0)
Platelets: 213 10*3/uL (ref 150–400)
RBC: 3.07 MIL/uL — ABNORMAL LOW (ref 3.87–5.11)
RDW: 15.6 % — ABNORMAL HIGH (ref 11.5–15.5)
WBC: 7.3 10*3/uL (ref 4.0–10.5)
nRBC: 0 % (ref 0.0–0.2)

## 2021-09-26 LAB — RENAL FUNCTION PANEL
Albumin: 2.5 g/dL — ABNORMAL LOW (ref 3.5–5.0)
Anion gap: 10 (ref 5–15)
BUN: 37 mg/dL — ABNORMAL HIGH (ref 6–20)
CO2: 24 mmol/L (ref 22–32)
Calcium: 8.8 mg/dL — ABNORMAL LOW (ref 8.9–10.3)
Chloride: 95 mmol/L — ABNORMAL LOW (ref 98–111)
Creatinine, Ser: 4.4 mg/dL — ABNORMAL HIGH (ref 0.44–1.00)
GFR, Estimated: 11 mL/min — ABNORMAL LOW (ref >60)
Glucose, Bld: 91 mg/dL (ref 70–99)
Phosphorus: 4.7 mg/dL — ABNORMAL HIGH (ref 2.5–4.6)
Potassium: 4.7 mmol/L (ref 3.5–5.1)
Sodium: 129 mmol/L — ABNORMAL LOW (ref 135–145)

## 2021-09-26 LAB — GLUCOSE, CAPILLARY
Glucose-Capillary: 77 mg/dL (ref 70–99)
Glucose-Capillary: 162 mg/dL — ABNORMAL HIGH (ref 70–99)
Glucose-Capillary: 188 mg/dL — ABNORMAL HIGH (ref 70–99)

## 2021-09-26 MED ORDER — PENTAFLUOROPROP-TETRAFLUOROETH EX AERO: 1.0000 | INHALATION_SPRAY | CUTANEOUS | Status: DC | PRN

## 2021-09-26 MED ORDER — HEPARIN SODIUM (PORCINE) 1000 UNIT/ML IJ SOLN
INTRAMUSCULAR | Status: AC
  Administered 2021-09-26: 3000 [IU]
  Filled 2021-09-26: qty 2

## 2021-09-26 MED ORDER — HEPARIN SODIUM (PORCINE) 1000 UNIT/ML DIALYSIS
5000.0000 [IU] | INTRAMUSCULAR | Status: DC | PRN
  Administered 2021-09-26: 3000 [IU] via INTRAVENOUS_CENTRAL

## 2021-09-26 MED ORDER — HEPARIN SODIUM (PORCINE) 1000 UNIT/ML IJ SOLN
INTRAMUSCULAR | Status: AC
  Filled 2021-09-26: qty 1

## 2021-09-26 MED ORDER — HEPARIN SODIUM (PORCINE) 1000 UNIT/ML IJ SOLN
INTRAMUSCULAR | Status: AC
  Filled 2021-09-26: qty 3

## 2021-09-26 NOTE — Progress Notes (Signed)
Physical Therapy Treatment Patient Details Name: [name] MRN: [id number] DOB: June 25, 1965 Today's Date: 09/26/2021   History of Present Illness 56 y.o. female presented to ED 6/26 from dialysis with increased bloody drainage from L hip wound. 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 s/p hardware removal of left hip with partial resection of tuft tissue and femure that were nonviable on 08/10/21 Discharged home 6/23. underwent L hip debridement and wound vac placement 6/28; +Left intertrochanteric non-union,  Fracture of left distal femur  PMH: hypertension, hyperlipidemia, ESRD on HD MWF, history of left BKA in 2021, depression/anxiety, stroke, tobacco use, T2DM,  insomnia, chronic pain syndrome,    PT Comments    Patient given a timeframe for when PT/OT would work with her today and she was ready, having been premedicated for pain. Able to stand x 4 reps from elevated EOB today--longest time standing 60 seconds. She is frustrated with continued RIGHT knee pain and reports this is limiting her ability to sit to stand and maintain standing for longer periods. Agrees to work on getting into recliner next week.     Recommendations for follow up therapy are one component of a multi-disciplinary discharge planning process, led by the attending physician.  Recommendations may be updated based on patient status, additional functional criteria and insurance authorization.  Follow Up Recommendations  Skilled nursing-short term rehab (<3 hours/day) Can patient physically be transported by private vehicle: No   Assistance Recommended at Discharge Intermittent Supervision/Assistance  Patient can return home with the following A lot of help with bathing/dressing/bathroom;Assistance with cooking/housework;Assist for transportation;Two people to help with walking and/or transfers;Help with stairs or ramp for entrance   Equipment Recommendations  Other (comment) (Seems  well-equipped; agree with looking into medical [name] transport for HD)    Recommendations for Other Services       Precautions / Restrictions Precautions Precautions: Fall;Other (comment) Precaution Comments: L BKA (baseline), L LE wound vac Restrictions Weight Bearing Restrictions: Yes LLE Weight Bearing: Non weight bearing Other Position/Activity Restrictions: L distal femur fx--non union     Mobility  Bed Mobility Overal bed mobility: Needs Assistance Bed Mobility: Supine to Sit, Sit to Supine, Rolling Rolling: Modified independent (Device/Increase time)   Supine to sit: Supervision Sit to supine: Supervision   General bed mobility comments: performed rolling to straighten pads.  Increased time to get to EOB and back to supine due to pain    Transfers Overall transfer level: Needs assistance Equipment used: Rolling walker (2 wheels) Transfers: Sit to/from Stand Sit to Stand: Mod assist, +2 physical assistance, Max assist           General transfer comment: performed 4 stands from EOB with patient tolerating 15 seconds on one stand and 45-60 seconds on other stands    Ambulation/Gait               General Gait Details: non ambulatory at baseline   Stairs             Wheelchair Mobility    Modified Rankin (Stroke Patients Only)       Balance Overall balance assessment: Needs assistance Sitting-balance support: Feet supported Sitting balance-Leahy Scale: Good     Standing balance support: Bilateral upper extremity supported, During functional activity, Reliant on assistive device for balance Standing balance-Leahy Scale: Poor Standing balance comment: stood for 60 seconds on 4th stand  Cognition Arousal/Alertness: Awake/alert Behavior During Therapy: WFL for tasks assessed/performed Overall Cognitive Status: Within Functional Limits for tasks assessed                                           Exercises Total Joint Exercises Bridges:  (reports she has been doing these since instructed last session) General Exercises - Lower Extremity Long Arc Quad: Right, Seated, 20 reps    General Comments General comments (skin integrity, edema, etc.): Patient tearful re: inability to stand on her own. Discussed SNF recommendation and she is still refusing, despite explaining she will get more therapy there than in acute care      Pertinent Vitals/Pain Pain Assessment Pain Assessment: Faces Faces Pain Scale: Hurts even more Pain Location: L knee, rt knee Pain Descriptors / Indicators: Discomfort, Aching, Grimacing, Guarding Pain Intervention(s): Limited activity within patient's tolerance, Monitored during session, Premedicated before session, Repositioned    Home Living                          Prior Function            PT Goals (current goals can now be found in the care plan section) Acute Rehab PT Goals Patient Stated Goal: less pain with movement PT Goal Formulation: With patient Time For Goal Achievement: 10/10/21 Potential to Achieve Goals: Fair Progress towards PT goals: Progressing toward goals (goal update based on timeframe)    Frequency    Min 2X/week      PT Plan Current plan remains appropriate;Frequency needs to be updated    Co-evaluation PT/OT/SLP Co-Evaluation/Treatment: Yes Reason for Co-Treatment: Necessary to address cognition/behavior during functional activity;To address functional/ADL transfers   OT goals addressed during session: Strengthening/ROM      AM-PAC PT "6 Clicks" Mobility   Outcome Measure  Help needed turning from your back to your side while in a flat bed without using bedrails?: None Help needed moving from lying on your back to sitting on the side of a flat bed without using bedrails?: A Little Help needed moving to and from a bed to a chair (including a wheelchair)?: Total Help needed standing up from a  chair using your arms (e.g., wheelchair or bedside chair)?: Total Help needed to walk in hospital room?: Total Help needed climbing 3-5 steps with a railing? : Total 6 Click Score: 11    End of Session Equipment Utilized During Treatment: Other (comment);Gait belt (bed pad) Activity Tolerance: Patient tolerated treatment well Patient left: in bed;with call bell/phone within reach Nurse Communication: Mobility status PT Visit Diagnosis: Other abnormalities of gait and mobility (R26.89);Muscle weakness (generalized) (M62.81);History of falling (Z91.81);Pain Pain - Right/Left: Left Pain - part of body: Leg     Time: 1300-1330 PT Time Calculation (min) (ACUTE ONLY): 30 min  Charges:  $Gait Training: 8-22 mins                      [place]  Office [phone]    [name] 09/26/2021, 2:53 PM

## 2021-09-26 NOTE — Progress Notes (Signed)
Occupational Therapy Treatment Patient Details Name: [name] MRN: [id number] DOB: [birth year] Today's Date: 09/26/2021   History of present illness 56 y.o. female presented to ED 6/26 from dialysis with increased bloody drainage from L hip wound. 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 s/p hardware removal of left hip with partial resection of tuft tissue and femure that were nonviable on 08/10/21 Discharged home 6/23. underwent L hip debridement and wound vac placement 6/28; +Left intertrochanteric non-union,  Fracture of left distal femur  PMH: hypertension, hyperlipidemia, ESRD on HD MWF, history of left BKA in 2021, depression/anxiety, stroke, tobacco use, T2DM,  insomnia, chronic pain syndrome,   OT comments  Patient received in supine and agreeable to OT/PT session. Patient stated increased RLE knee pain but willing to attempt standing from EOB. Patient was mod assist to max assist +2 to stand from EOB to RW and tolerated up to 60 seconds on 4th stand with patient stating pain when sitting. Patient making good gains and states she is willing to try transfer to recliner next week. Acute OT to continue to follow.    Recommendations for follow up therapy are one component of a multi-disciplinary discharge planning process, led by the attending physician.  Recommendations may be updated based on patient status, additional functional criteria and insurance authorization.    Follow Up Recommendations  Skilled nursing-short term rehab (<3 hours/day)    Assistance Recommended at Discharge Intermittent Supervision/Assistance  Patient can return home with the following  A lot of help with bathing/dressing/bathroom;Assistance with cooking/housework;Assist for transportation;Help with stairs or ramp for entrance;Two people to help with walking and/or transfers   Equipment Recommendations  Other (comment) (drop arm commode)    Recommendations for Other Services       Precautions / Restrictions Precautions Precautions: Fall;Other (comment) Precaution Comments: L BKA (baseline), L LE wound vac Restrictions Weight Bearing Restrictions: Yes LLE Weight Bearing: Non weight bearing Other Position/Activity Restrictions: L distal femur fx--non union       Mobility Bed Mobility Overal bed mobility: Needs Assistance Bed Mobility: Supine to Sit, Sit to Supine, Rolling Rolling: Modified independent (Device/Increase time)   Supine to sit: Supervision Sit to supine: Supervision   General bed mobility comments: performed rolling to straighten pads.  Increased time to get to EOB and back to supine due to pain    Transfers Overall transfer level: Needs assistance Equipment used: Rolling walker (2 wheels) Transfers: Sit to/from Stand Sit to Stand: Mod assist, +2 physical assistance           General transfer comment: performed 4 stands from EOB with patient tolerating 15 seconds on one stand and 45-60 seconds on other stands     Balance Overall balance assessment: Needs assistance Sitting-balance support: Feet supported Sitting balance-Leahy Scale: Good     Standing balance support: Bilateral upper extremity supported, During functional activity, Reliant on assistive device for balance Standing balance-Leahy Scale: Poor Standing balance comment: stood for 60 seconds on 4th stand                           ADL either performed or assessed with clinical judgement   ADL                                              Extremity/Trunk Assessment  Vision       Perception     Praxis      Cognition Arousal/Alertness: Awake/alert Behavior During Therapy: WFL for tasks assessed/performed Overall Cognitive Status: Within Functional Limits for tasks assessed                           Safety/Judgement: Decreased awareness of deficits              Exercises      Shoulder  Instructions       General Comments      Pertinent Vitals/ Pain       Pain Assessment Pain Assessment: Faces Faces Pain Scale: Hurts even more Pain Location: L knee, rt knee Pain Descriptors / Indicators: Discomfort, Aching, Grimacing, Guarding Pain Intervention(s): Limited activity within patient's tolerance, Monitored during session, Repositioned  Home Living                                          Prior Functioning/Environment              Frequency  Min 2X/week        Progress Toward Goals  OT Goals(current goals can now be found in the care plan section)  Progress towards OT goals: Progressing toward goals  Acute Rehab OT Goals Patient Stated Goal: get better OT Goal Formulation: With patient Time For Goal Achievement: 10/08/21 Potential to Achieve Goals: Fair ADL Goals Pt Will Perform Grooming: with modified independence;sitting Pt Will Perform Lower Body Bathing: with min assist;sitting/lateral leans Pt Will Perform Lower Body Dressing: with min assist;sitting/lateral leans Pt Will Transfer to Toilet: bedside commode;with transfer board;with supervision Pt Will Perform Toileting - Clothing Manipulation and hygiene: with min assist;sitting/lateral leans Pt/caregiver will Perform Home Exercise Program: Increased strength;Both right and left upper extremity;With theraband;Independently;With written HEP provided Additional ADL Goal #1: Pt will perform bed mobility modified independently in preparation for ADLs with HOB flat.  Plan Discharge plan remains appropriate;Frequency remains appropriate    Co-evaluation    PT/OT/SLP Co-Evaluation/Treatment: Yes Reason for Co-Treatment: Complexity of the patient's impairments (multi-system involvement);For patient/therapist safety;To address functional/ADL transfers   OT goals addressed during session: Strengthening/ROM      AM-PAC OT "6 Clicks" Daily Activity     Outcome Measure   Help from  another person eating meals?: None Help from another person taking care of personal grooming?: A Little Help from another person toileting, which includes using toliet, bedpan, or urinal?: Total Help from another person bathing (including washing, rinsing, drying)?: A Lot Help from another person to put on and taking off regular upper body clothing?: A Little Help from another person to put on and taking off regular lower body clothing?: A Lot 6 Click Score: 15    End of Session Equipment Utilized During Treatment: Gait belt;Rolling walker (2 wheels)  OT Visit Diagnosis: Muscle weakness (generalized) (M62.81);Unsteadiness on feet (R26.81);History of falling (Z91.81)   Activity Tolerance Patient tolerated treatment well   Patient Left in bed;with call bell/phone within reach;with bed alarm set   Nurse Communication Mobility status        Time: 1300-1330 OT Time Calculation (min): 30 min  Charges: OT General Charges $OT Visit: 1 Visit OT Treatments $Therapeutic Activity: 8-22 mins  [name], [place]  Office [place] 09/26/2021, 2:35 PM

## 2021-09-26 NOTE — Progress Notes (Addendum)
[facility] Progress Note   Subjective: Seen in room. Says she is going to get up later today. Asked RN staff what is going on with fluid restrictions. She is repeatedly gaining 5 liters between every treatment. HD tomorrow on schedule.    Have removed ice pitcher from room. RNs have been instructed not to put back in room, record all intake. Start daily HD. Patient still refusing SNF placement.    Objective Vitals:   09/25/21 1357 09/25/21 2121 09/26/21 0500 09/26/21 0601  BP: (!) 149/69 (!) 152/43  (!) 154/95  Pulse: 68 67  64  Resp: 10 18  18   Temp: 97.9 F (36.6 C) 98.4 F (36.9 C)  98.3 F (36.8 C)  TempSrc:  Oral  Oral  SpO2: 100% 98%  100%  Weight: 96.3 kg  102.7 kg   Height:       Physical Exam General: Pleasant, chronically ill appearing female Heart:S1,S2, No M/R/G. 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 Compression hose RLE, +trace-1+ edema RLE L hip Dialysis Access: L AVF +T/B      Additional Objective Labs: Basic Metabolic Panel: Recent Labs  Lab 09/19/21 1101 09/23/21 1159 09/25/21 0717  NA 128* 125* 129*  K 5.5* 5.3* 5.1  CL 93* 92* 92*  CO2 27 23 27   GLUCOSE 92 145* 131*  BUN 39* 47* 42*  CREATININE 5.40* 5.92* 5.36*  CALCIUM 8.9 8.3* 8.7*  PHOS  --  5.7* 4.8*   Liver Function Tests: Recent Labs  Lab 09/23/21 1159 09/25/21 0717  ALBUMIN 2.4* 2.7*   No results for input(s): "LIPASE", "AMYLASE" in the last 168 hours. CBC: Recent Labs  Lab 09/19/21 1101 09/23/21 1200 09/25/21 0717  WBC 7.3 7.3 7.2  HGB 8.1* 8.6* 8.9*  HCT 25.6* 27.0* 28.5*  MCV 93.1 93.1 94.1  PLT 241 193 199   Blood Culture    Component Value Date/Time   SDES TISSUE 08/28/2021 0917   SPECREQUEST LEFT THIGH TISSUE 08/28/2021 0917   CULT  08/28/2021 0917    MODERATE ENTEROCOCCUS FAECIUM VANCOMYCIN RESISTANT ENTEROCOCCUS ISOLATED NO ANAEROBES ISOLATED Sent to [place] for further  susceptibility testing. Performed at [facility], [place] [address], [place], [place] [address]    REPTSTATUS 09/02/2021 FINAL 08/28/2021 0917    Cardiac Enzymes: Recent Labs  Lab 09/20/21 0403  CKTOTAL 31*   CBG: Recent Labs  Lab 09/24/21 2145 09/25/21 0728 09/25/21 1641 09/25/21 2121 09/26/21 0737  GLUCAP 157* 123* 160* 160* 77   Iron Studies: No results for input(s): "IRON", "TIBC", "TRANSFERRIN", "FERRITIN" in the last 72 hours. @lablastinr3 @ Studies/Results: No results found. Medications:  sodium chloride     sodium chloride Stopped (08/29/21 0139)   sodium chloride 10 mL/hr at 09/19/21 1310   DAPTOmycin (CUBICIN) 900 mg in sodium chloride 0.9 % IVPB Stopped (09/25/21 2145)   methocarbamol (ROBAXIN) IV      alosetron  1 mg Oral BID   amitriptyline  25 mg Oral QHS   amLODipine  10 mg Oral Q24H   atorvastatin  40 mg Oral q1800   carvedilol  6.25 mg Oral BID   Chlorhexidine Gluconate Cloth  6 each Topical Q0600   cyclobenzaprine  10 mg Oral TID   darbepoetin (ARANESP) injection - DIALYSIS  200 mcg Intravenous Q Fri-HD   docusate sodium  100 mg Oral BID   DULoxetine  60 mg Oral Daily   famotidine  20 mg Oral Daily  furosemide  80 mg Oral Daily   heparin  5,000 Units Subcutaneous Q8H   hydrALAZINE  50 mg Oral TID   insulin aspart  0-6 Units Subcutaneous TID WC   insulin aspart  5 Units Subcutaneous TID WC   insulin glargine-yfgn  12 Units Subcutaneous QHS   multivitamin  1 tablet Oral QHS   pantoprazole  80 mg Oral Daily   sevelamer carbonate  1,600 mg Oral TID WC   sodium chloride flush  3 mL Intravenous Q12H       Dialysis Orders: [facility] MWF 4h  400/500  90kg  2/2 bath  P2  Heparin 4000 units IV TIW  LUA AVF   Assessment/Plan:  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 HD  09/27/2021 START DAILY HD 3.5 HOURS DAILY. Continue to encourage to sit in chair for HD although this is truly not a criteria for discharge. Please see # [phone].   AVF Access: Issues with clots, cannulation. To OR 09/19/2021 per Dr. Virl Cagey for branch ligation. Alarmed last HD but able to complete treatment.  HTN/ volume: BP elevated.  Remains volume overloaded.  If weights correct 10kg over dry, max UF as tolerated with HD. Remind of importance of following fluid restrictions. Increased amlodipine to 10mg  qd and continue carvedilol 6.25mg  bid. Do not understand how she is repeatedly gaining 5 Liters between EVERY treatment then uses excess volume as excuse not to get in chair. Strict fluid restrictions and I & O.  Anemia of ESRD- last HGB 8.9. s/p 2 units pRBC on 6/29. Aranesp 225mcg given 7/21.Transfuse if HGB <7.0.  Secondary HPTH: CCa and PO4 in range. Cont binders.  DM2: Insulin per primary Dispo: Patient has issues with no handicap ramp at her home. OP HD clinic cannot help her get in and out of her car when she arrives to HD. SNF recommended.  Last admission she was supposed to go to SNF but on day of discharge, she refused SNF placement. Very difficult situation.  Not clear what chance of success she will have as OP regardless of her ability to sit in chair if her home situation is not improved.  Have reached out to renal MSW.  Shaianne Nucci H. Abrian Hanover NP-C 09/26/2021, 10:17 AM  [employer] [phone]

## 2021-09-26 NOTE — Progress Notes (Signed)
PROGRESS NOTE    [name]  ZOX:[id number] DOB: [birth year] DOA: 08/26/2021 PCP: [name], MD    Brief Narrative:  56 year old woman PMH including ESRD, complicated left hip and femur issues requiring multiple surgeries, removal of hardware presented 6/26 with bloody drainage from surgical incision.  Seen by orthopedics and underwent I&D 6/28 and 7/1.  Currently unable to progress care secondary to refusal to sit in chair for HD.   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  Active Problems:   Closed intertrochanteric fracture of hip, left, sequela   ESRD (end stage renal disease) on dialysis ([place])   Uncontrolled type 2 diabetes mellitus with hypoglycemia, with long-term current use of insulin (HCC)   History of CVA (cerebrovascular accident)   Benign essential HTN   Dyslipidemia, goal LDL below 100   Anxiety and depression   GASTROESOPHAGEAL REFLUX, NO ESOPHAGITIS   TOBACCO DEPENDENCE   Wound infection   Iron deficiency anemia, unspecified   Wound dehiscence, surgical, sequela   Left femoral shaft fracture (HCC)  Subacute osteomyelitis of the left femur with abscess, infected left hip hardware status post removal, left intertrochanteric fracture --Wound VAC to the left outer thigh per orthopedics, continue wound care, cleared by Dr. [name] for discharge; follow-up with orthopedics as an outpatient --per ID daptomycin for 6 weeks with dialysis, stop date 8/12. Follow-up with Dr. Candiss Norse    ESRD --Dialysis MWF.  Patient needs to be able to sit in chair to tolerate outpatient dialysis for discharge --Nephrology managing --s/p fistula revision and branch ligation. -Cont to not tolerate HD. Discussed with Nephrology - recommendations for SNF as pt would not be safe for home d/c (no ramp to get into home)   Anemia of chronic disease, component of acute blood loss anemia from wound --Status post 2 unit packed red blood cells  6/29 --Hemoglobin stable    Uncontrolled type 2 diabetes with hypoglycemia --A1c 7.1 --CBG stable. Continue Semglee, NovoLog, sliding scale. CBG stable  Essential hypertension --stable. Continue amlodipine, Coreg, hydralazine   Hyperlipidemia --continue Lipitor    DVT prophylaxis: heparin subq Code Status: Full Family Communication: Pt in room, family not at bedside  Status is: Inpatient Remains inpatient appropriate because: Severity of illness   Consultants:  Nephrology Orthopedic Surgery  Antimicrobials: Anti-infectives (From admission, onward)    Start     Dose/Rate Route Frequency Ordered Stop   09/02/21 2000  DAPTOmycin (CUBICIN) 900 mg in sodium chloride 0.9 % IVPB        10 mg/kg  92.4 kg 136 mL/hr over 30 Minutes Intravenous Once per day on Mon Wed Fri 09/02/21 0836 10/12/21 2359   08/31/21 0745  ceFAZolin (ANCEF) IVPB 2g/100 mL premix        2 g 200 mL/hr over 30 Minutes Intravenous To Short Stay 08/31/21 0730 08/31/21 0918   08/30/21 0600  ceFAZolin (ANCEF) IVPB 2g/100 mL premix  Status:  Discontinued        2 g 200 mL/hr over 30 Minutes Intravenous To Short Stay 08/30/21 0123 08/31/21 0600   08/29/21 2000  DAPTOmycin (CUBICIN) 900 mg in sodium chloride 0.9 % IVPB  Status:  Discontinued        10 mg/kg  92.4 kg 136 mL/hr over 30 Minutes Intravenous Every 48 hours 08/29/21 1627 09/02/21 0836   08/28/21 1800  ceFAZolin (ANCEF) IVPB 2g/100 mL premix  Status:  Discontinued        2 g 200  mL/hr over 30 Minutes Intravenous Every M-W-F (1800) 08/27/21 0846 08/29/21 1620   08/28/21 1200  ceFAZolin (ANCEF) IVPB 1 g/50 mL premix  Status:  Discontinued        1 g 100 mL/hr over 30 Minutes Intravenous Every M-W-F (Hemodialysis) 08/26/21 1954 08/27/21 0846   08/28/21 0600  ceFAZolin (ANCEF) IVPB 2g/100 mL premix        2 g 200 mL/hr over 30 Minutes Intravenous On call to O.R. 08/27/21 1150 08/28/21 0909   08/27/21 1800  ceFAZolin (ANCEF) IVPB 1 g/50 mL premix         1 g 100 mL/hr over 30 Minutes Intravenous  Once 08/26/21 1954 08/27/21 2048   08/26/21 1500  ceFEPIme (MAXIPIME) 1 g in sodium chloride 0.9 % 100 mL IVPB  Status:  Discontinued        1 g 200 mL/hr over 30 Minutes Intravenous Every 24 hours 08/26/21 1452 08/26/21 1714       Subjective: Without complaints this AM  Objective: Vitals:   09/25/21 1357 09/25/21 2121 09/26/21 0500 09/26/21 0601  BP: (!) 149/69 (!) 152/43  (!) 154/95  Pulse: 68 67  64  Resp: 10 18  18   Temp: 97.9 F (36.6 C) 98.4 F (36.9 C)  98.3 F (36.8 C)  TempSrc:  Oral  Oral  SpO2: 100% 98%  100%  Weight: 96.3 kg  102.7 kg   Height:        Intake/Output Summary (Last 24 hours) at 09/26/2021 1504 Last data filed at 09/26/2021 1300 Gross per 24 hour  Intake 800 ml  Output 800 ml  Net 0 ml    Filed Weights   09/25/21 0925 09/25/21 1357 09/26/21 0500  Weight: 100.6 kg 96.3 kg 102.7 kg    Examination: General exam: Conversant, in no acute distress Respiratory system: normal chest rise, clear, no audible wheezing Cardiovascular system: regular rhythm, s1-s2 Gastrointestinal system: Nondistended, nontender, pos BS Central nervous system: No seizures, no tremors Extremities: No cyanosis, no joint deformities Skin: No rashes, no pallor Psychiatry: Affect normal // no auditory hallucinations   Data Reviewed: I have personally reviewed following labs and imaging studies  CBC: Recent Labs  Lab 09/23/21 1200 09/25/21 0717  WBC 7.3 7.2  HGB 8.6* 8.9*  HCT 27.0* 28.5*  MCV 93.1 94.1  PLT 193 798    Basic Metabolic Panel: Recent Labs  Lab 09/23/21 1159 09/25/21 0717  NA 125* 129*  K 5.3* 5.1  CL 92* 92*  CO2 23 27  GLUCOSE 145* 131*  BUN 47* 42*  CREATININE 5.92* 5.36*  CALCIUM 8.3* 8.7*  PHOS 5.7* 4.8*    GFR: Estimated Creatinine Clearance: 15.6 mL/min (A) (by C-G formula based on SCr of 5.36 mg/dL (H)). Liver Function Tests: Recent Labs  Lab 09/23/21 1159 09/25/21 0717  ALBUMIN  2.4* 2.7*    No results for input(s): "LIPASE", "AMYLASE" in the last 168 hours. No results for input(s): "AMMONIA" in the last 168 hours. Coagulation Profile: No results for input(s): "INR", "PROTIME" in the last 168 hours. Cardiac Enzymes: Recent Labs  Lab 09/20/21 0403  CKTOTAL 31*    BNP (last 3 results) No results for input(s): "PROBNP" in the last 8760 hours. HbA1C: No results for input(s): "HGBA1C" in the last 72 hours. CBG: Recent Labs  Lab 09/25/21 0728 09/25/21 1641 09/25/21 2121 09/26/21 0737 09/26/21 1127  GLUCAP 123* 160* 160* 77 162*    Lipid Profile: No results for input(s): "CHOL", "HDL", "LDLCALC", "TRIG", "CHOLHDL", "LDLDIRECT" in  the last 72 hours. Thyroid Function Tests: No results for input(s): "TSH", "T4TOTAL", "FREET4", "T3FREE", "THYROIDAB" in the last 72 hours. Anemia Panel: No results for input(s): "VITAMINB12", "FOLATE", "FERRITIN", "TIBC", "IRON", "RETICCTPCT" in the last 72 hours. Sepsis Labs: No results for input(s): "PROCALCITON", "LATICACIDVEN" in the last 168 hours.  No results found for this or any previous visit (from the past 240 hour(s)).   Radiology Studies: No results found.  Scheduled Meds:  alosetron  1 mg Oral BID   amitriptyline  25 mg Oral QHS   amLODipine  10 mg Oral Q24H   atorvastatin  40 mg Oral q1800   carvedilol  6.25 mg Oral BID   Chlorhexidine Gluconate Cloth  6 each Topical Q0600   cyclobenzaprine  10 mg Oral TID   darbepoetin (ARANESP) injection - DIALYSIS  200 mcg Intravenous Q Fri-HD   docusate sodium  100 mg Oral BID   DULoxetine  60 mg Oral Daily   famotidine  20 mg Oral Daily   furosemide  80 mg Oral Daily   heparin  5,000 Units Subcutaneous Q8H   hydrALAZINE  50 mg Oral TID   insulin aspart  0-6 Units Subcutaneous TID WC   insulin aspart  5 Units Subcutaneous TID WC   insulin glargine-yfgn  12 Units Subcutaneous QHS   multivitamin  1 tablet Oral QHS   pantoprazole  80 mg Oral Daily   sevelamer  carbonate  1,600 mg Oral TID WC   sodium chloride flush  3 mL Intravenous Q12H   Continuous Infusions:  sodium chloride     sodium chloride Stopped (08/29/21 0139)   sodium chloride 10 mL/hr at 09/19/21 1310   DAPTOmycin (CUBICIN) 900 mg in sodium chloride 0.9 % IVPB Stopped (09/25/21 2145)   methocarbamol (ROBAXIN) IV       LOS: 31 days   Marylu Lund, MD [facility] Pager On Amion  If 7PM-7AM, please contact night-coverage 09/26/2021, 3:04 PM

## 2021-09-27 DIAGNOSIS — M86252 Subacute osteomyelitis, left femur: Secondary | ICD-10-CM | POA: Diagnosis not present

## 2021-09-27 DIAGNOSIS — E785 Hyperlipidemia, unspecified: Secondary | ICD-10-CM | POA: Diagnosis not present

## 2021-09-27 DIAGNOSIS — F419 Anxiety disorder, unspecified: Secondary | ICD-10-CM | POA: Diagnosis not present

## 2021-09-27 DIAGNOSIS — I10 Essential (primary) hypertension: Secondary | ICD-10-CM | POA: Diagnosis not present

## 2021-09-27 LAB — CK: Total CK: 26 U/L — ABNORMAL LOW (ref 38–234)

## 2021-09-27 LAB — GLUCOSE, CAPILLARY
Glucose-Capillary: 136 mg/dL — ABNORMAL HIGH (ref 70–99)
Glucose-Capillary: 212 mg/dL — ABNORMAL HIGH (ref 70–99)
Glucose-Capillary: 97 mg/dL (ref 70–99)
Glucose-Capillary: 231 mg/dL — ABNORMAL HIGH (ref 70–99)

## 2021-09-27 MED ORDER — HEPARIN SODIUM (PORCINE) 1000 UNIT/ML IJ SOLN
INTRAMUSCULAR | Status: AC
  Filled 2021-09-27: qty 5

## 2021-09-27 MED ORDER — HEPARIN SODIUM (PORCINE) 1000 UNIT/ML DIALYSIS
1000.0000 [IU] | INTRAMUSCULAR | Status: DC | PRN
  Filled 2021-09-27: qty 1

## 2021-09-27 MED ORDER — PENTAFLUOROPROP-TETRAFLUOROETH EX AERO: 1.0000 | INHALATION_SPRAY | CUTANEOUS | Status: DC | PRN

## 2021-09-27 MED ORDER — ALTEPLASE 2 MG IJ SOLR: 2.0000 mg | Freq: Once | INTRAMUSCULAR | Status: DC | PRN

## 2021-09-27 NOTE — Progress Notes (Signed)
PROGRESS NOTE    [name]  ZDG:[id number] DOB: 02-05-1966 DOA: 08/26/2021 PCP: [name], MD    Brief Narrative:  56 year old woman PMH including ESRD, complicated left hip and femur issues requiring multiple surgeries, removal of hardware presented 6/26 with bloody drainage from surgical incision.  Seen by orthopedics and underwent I&D 6/28 and 7/1.  Currently unable to progress care secondary to refusal to sit in chair for HD.   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  Active Problems:   Closed intertrochanteric fracture of hip, left, sequela   ESRD (end stage renal disease) on dialysis ([place])   Uncontrolled type 2 diabetes mellitus with hypoglycemia, with long-term current use of insulin (HCC)   History of CVA (cerebrovascular accident)   Benign essential HTN   Dyslipidemia, goal LDL below 100   Anxiety and depression   GASTROESOPHAGEAL REFLUX, NO ESOPHAGITIS   TOBACCO DEPENDENCE   Wound infection   Iron deficiency anemia, unspecified   Wound dehiscence, surgical, sequela   Left femoral shaft fracture (HCC)  Subacute osteomyelitis of the left femur with abscess, infected left hip hardware status post removal, left intertrochanteric fracture --Wound VAC to the left outer thigh per orthopedics, continue wound care, cleared by Dr. [name] for discharge; follow-up with orthopedics as an outpatient --per ID daptomycin for 6 weeks with dialysis, stop date 8/12. Follow-up with Dr. Candiss Norse    ESRD --Dialysis MWF.  Patient needs to be able to sit in chair to tolerate outpatient dialysis for discharge --Nephrology managing --s/p fistula revision and branch ligation. -Cont to not tolerate HD. Discussed with Nephrology - recommendations for SNF as pt would not be safe for home d/c (no ramp to get into home) -Plans for HD today however pt reportedly did not complete because of line occlusion   Anemia of chronic disease, component of  acute blood loss anemia from wound --Status post 2 unit packed red blood cells 6/29 --Hemoglobin stable    Uncontrolled type 2 diabetes with hypoglycemia --A1c 7.1 --CBG stable. Continue Semglee, NovoLog, sliding scale. CBG stable  Essential hypertension --stable. Continue amlodipine, Coreg, hydralazine   Hyperlipidemia --continue Lipitor    DVT prophylaxis: heparin subq Code Status: Full Family Communication: Pt in room, family not at bedside  Status is: Inpatient Remains inpatient appropriate because: Severity of illness   Consultants:  Nephrology Orthopedic Surgery  Antimicrobials: Anti-infectives (From admission, onward)    Start     Dose/Rate Route Frequency Ordered Stop   09/02/21 2000  DAPTOmycin (CUBICIN) 900 mg in sodium chloride 0.9 % IVPB        10 mg/kg  92.4 kg 136 mL/hr over 30 Minutes Intravenous Once per day on Mon Wed Fri 09/02/21 0836 10/12/21 2359   08/31/21 0745  ceFAZolin (ANCEF) IVPB 2g/100 mL premix        2 g 200 mL/hr over 30 Minutes Intravenous To Short Stay 08/31/21 0730 08/31/21 0918   08/30/21 0600  ceFAZolin (ANCEF) IVPB 2g/100 mL premix  Status:  Discontinued        2 g 200 mL/hr over 30 Minutes Intravenous To Short Stay 08/30/21 0123 08/31/21 0600   08/29/21 2000  DAPTOmycin (CUBICIN) 900 mg in sodium chloride 0.9 % IVPB  Status:  Discontinued        10 mg/kg  92.4 kg 136 mL/hr over 30 Minutes Intravenous Every 48 hours 08/29/21 1627 09/02/21 0836   08/28/21 1800  ceFAZolin (ANCEF) IVPB 2g/100 mL premix  Status:  Discontinued        2 g 200 mL/hr over 30 Minutes Intravenous Every M-W-F (1800) 08/27/21 0846 08/29/21 1620   08/28/21 1200  ceFAZolin (ANCEF) IVPB 1 g/50 mL premix  Status:  Discontinued        1 g 100 mL/hr over 30 Minutes Intravenous Every M-W-F (Hemodialysis) 08/26/21 1954 08/27/21 0846   08/28/21 0600  ceFAZolin (ANCEF) IVPB 2g/100 mL premix        2 g 200 mL/hr over 30 Minutes Intravenous On call to O.R. 08/27/21 1150  08/28/21 0909   08/27/21 1800  ceFAZolin (ANCEF) IVPB 1 g/50 mL premix        1 g 100 mL/hr over 30 Minutes Intravenous  Once 08/26/21 1954 08/27/21 2048   08/26/21 1500  ceFEPIme (MAXIPIME) 1 g in sodium chloride 0.9 % 100 mL IVPB  Status:  Discontinued        1 g 200 mL/hr over 30 Minutes Intravenous Every 24 hours 08/26/21 1452 08/26/21 1714       Subjective: No complaints this AM  Objective: Vitals:   09/26/21 2136 09/27/21 0459 09/27/21 0927 09/27/21 1303  BP: (!) 141/59 (!) 137/47 (!) 155/56 (!) 146/52  Pulse: 68 71 69 71  Resp: 18 18 17 13   Temp: 98.3 F (36.8 C) 99 F (37.2 C) 98.6 F (37 C) 98.3 F (36.8 C)  TempSrc:      SpO2: 100% 98% 99% 98%  Weight:    93.5 kg  Height:        Intake/Output Summary (Last 24 hours) at 09/27/2021 1408 Last data filed at 09/27/2021 1300 Gross per 24 hour  Intake 360 ml  Output 5500 ml  Net -5140 ml    Filed Weights   09/26/21 1620 09/26/21 2030 09/27/21 1303  Weight: 96.2 kg 91 kg 93.5 kg    Examination: General exam: Awake, laying in bed, in nad Respiratory system: Normal respiratory effort, no wheezing Cardiovascular system: regular rate, s1, s2 Gastrointestinal system: Soft, nondistended, positive BS Central nervous system: CN2-12 grossly intact, strength intact Extremities: Perfused, no clubbing Skin: Normal skin turgor, no notable skin lesions seen Psychiatry: Mood normal // no visual hallucinations   Data Reviewed: I have personally reviewed following labs and imaging studies  CBC: Recent Labs  Lab 09/23/21 1200 09/25/21 0717 09/26/21 1639  WBC 7.3 7.2 7.3  HGB 8.6* 8.9* 9.2*  HCT 27.0* 28.5* 29.0*  MCV 93.1 94.1 94.5  PLT 193 199 701    Basic Metabolic Panel: Recent Labs  Lab 09/23/21 1159 09/25/21 0717 09/26/21 1639  NA 125* 129* 129*  K 5.3* 5.1 4.7  CL 92* 92* 95*  CO2 23 27 24   GLUCOSE 145* 131* 91  BUN 47* 42* 37*  CREATININE 5.92* 5.36* 4.40*  CALCIUM 8.3* 8.7* 8.8*  PHOS 5.7* 4.8*  4.7*    GFR: Estimated Creatinine Clearance: 18.1 mL/min (A) (by C-G formula based on SCr of 4.4 mg/dL (H)). Liver Function Tests: Recent Labs  Lab 09/23/21 1159 09/25/21 0717 09/26/21 1639  ALBUMIN 2.4* 2.7* 2.5*    No results for input(s): "LIPASE", "AMYLASE" in the last 168 hours. No results for input(s): "AMMONIA" in the last 168 hours. Coagulation Profile: No results for input(s): "INR", "PROTIME" in the last 168 hours. Cardiac Enzymes: Recent Labs  Lab 09/27/21 0446  CKTOTAL 26*    BNP (last 3 results) No results for input(s): "PROBNP" in the last 8760 hours. HbA1C: No results for input(s): "HGBA1C" in the last  72 hours. CBG: Recent Labs  Lab 09/26/21 0737 09/26/21 1127 09/26/21 2136 09/27/21 0718 09/27/21 1128  GLUCAP 77 162* 188* 136* 212*    Lipid Profile: No results for input(s): "CHOL", "HDL", "LDLCALC", "TRIG", "CHOLHDL", "LDLDIRECT" in the last 72 hours. Thyroid Function Tests: No results for input(s): "TSH", "T4TOTAL", "FREET4", "T3FREE", "THYROIDAB" in the last 72 hours. Anemia Panel: No results for input(s): "VITAMINB12", "FOLATE", "FERRITIN", "TIBC", "IRON", "RETICCTPCT" in the last 72 hours. Sepsis Labs: No results for input(s): "PROCALCITON", "LATICACIDVEN" in the last 168 hours.  No results found for this or any previous visit (from the past 240 hour(s)).   Radiology Studies: No results found.  Scheduled Meds:  alosetron  1 mg Oral BID   amitriptyline  25 mg Oral QHS   amLODipine  10 mg Oral Q24H   atorvastatin  40 mg Oral q1800   carvedilol  6.25 mg Oral BID   Chlorhexidine Gluconate Cloth  6 each Topical Q0600   cyclobenzaprine  10 mg Oral TID   darbepoetin (ARANESP) injection - DIALYSIS  200 mcg Intravenous Q Fri-HD   docusate sodium  100 mg Oral BID   DULoxetine  60 mg Oral Daily   famotidine  20 mg Oral Daily   furosemide  80 mg Oral Daily   heparin  5,000 Units Subcutaneous Q8H   heparin sodium (porcine)       hydrALAZINE   50 mg Oral TID   insulin aspart  0-6 Units Subcutaneous TID WC   insulin aspart  5 Units Subcutaneous TID WC   insulin glargine-yfgn  12 Units Subcutaneous QHS   multivitamin  1 tablet Oral QHS   pantoprazole  80 mg Oral Daily   sevelamer carbonate  1,600 mg Oral TID WC   sodium chloride flush  3 mL Intravenous Q12H   Continuous Infusions:  sodium chloride     sodium chloride 10 mL/hr at 09/19/21 1310   DAPTOmycin (CUBICIN) 900 mg in sodium chloride 0.9 % IVPB Stopped (09/25/21 2145)   methocarbamol (ROBAXIN) IV       LOS: 32 days   Marylu Lund, MD [facility] Pager On Amion  If 7PM-7AM, please contact night-coverage 09/27/2021, 2:08 PM

## 2021-09-27 NOTE — Plan of Care (Signed)
  Problem: Education: Goal: Individualized Educational Video(s) Outcome: Progressing   Problem: Tissue Perfusion: Goal: Adequacy of tissue perfusion will improve Outcome: Progressing   Problem: Health Behavior/Discharge Planning: Goal: Ability to manage health-related needs will improve Outcome: Progressing   Problem: Clinical Measurements: Goal: Will remain free from infection Outcome: Progressing   Problem: Activity: Goal: Risk for activity intolerance will decrease Outcome: Progressing   Problem: Pain Managment: Goal: General experience of comfort will improve Outcome: Progressing   Problem: Skin Integrity: Goal: Risk for impaired skin integrity will decrease Outcome: Progressing   Problem: Education: Goal: Individualized Educational Video(s) Outcome: Progressing   Problem: Fluid Volume: Goal: Compliance with measures to maintain balanced fluid volume will improve Outcome: Progressing   Problem: Health Behavior/Discharge Planning: Goal: Ability to manage health-related needs will improve Outcome: Progressing   Problem: Nutritional: Goal: Ability to make healthy dietary choices will improve Outcome: Progressing   Problem: Clinical Measurements: Goal: Complications related to the disease process, condition or treatment will be avoided or minimized Outcome: Progressing

## 2021-09-27 NOTE — Progress Notes (Signed)
Pharmacy Antibiotic Note  [name] is a 56 y.o. female admitted on 08/26/2021  with L-hip PJI. Prior intra-op cultures in May grew MSSA, now with intra-op cultures this admission growing Enterococcus faecium (VRE). Pharmacy has been consulted for Daptomycin dosing.     ESRD, usual MWF hemodialysis. Daptomycin is currently scheduled for MWF at 8pm, after HD on HD days.  Extra HD on 7/27 pm and planning daily HD for now.  Daptomycin dose is due tonight.        Weekly CK on Fridays > 26 today.  Has been 21-39 with weekly checks, stable.  Plan: Continue Daptomycin 900 mg (~10 mg/kg)  IV MWF after HD. Dose due tonight. Currently planning HD daily. Will follow up plans. May need to change Daptomycin from MWF to q48hr, as each HD is expected to remove ~15% of the drug.  Weekly CK monitoring on Fridays. Next due 8/4. Anticipated stop date 10/12/21 per ID   Height: 5\' 10"  (177.8 cm) Weight: 93.5 kg (206 lb 2.1 oz) IBW/kg (Calculated) : 68.5  Temp (24hrs), Avg:98.4 F (36.9 C), Min:97.9 F (36.6 C), Max:99 F (37.2 C)  Recent Labs  Lab 09/23/21 1159 09/23/21 1200 09/25/21 0717 09/26/21 1639  WBC  --  7.3 7.2 7.3  CREATININE 5.92*  --  5.36* 4.40*    Estimated Creatinine Clearance: 18.1 mL/min (A) (by C-G formula based on SCr of 4.4 mg/dL (H)).    Allergies  Allergen Reactions   Trazodone Swelling   Latex Rash   Lidocaine Itching    Antimicrobials this admission: Cefepime x 1 in ED 6/26 Cefazolin 6/13>> 6/29 Daptomycin 6/29>>(8/12)  Microbiology results: 6/10 L hip tissue: MSSA, no anaerobes seen 6/28 L thigh tissue: E.faecium (R amp/vanc, S to linezolid) 6/28 MRSA PCR: positive for Staph, negative for MRSA  Thank you for allowing pharmacy to be a part of this patient's care.  Arty Baumgartner, [place] 09/27/2021 3:14 PM

## 2021-09-27 NOTE — Progress Notes (Addendum)
[facility] Progress Note   Subjective:    Seen and examined patient at bedside. Will d/w RN on progress with fluid intake and working with PT. On HD daily now-tolerated yesterday's HD with net UF 5L. Plan for HD today. She's now thinking about SNF placement as she is aware there is no other option.  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 Compression hose RLE, +trace-1+ edema RLE L hip Dialysis Access: L AVF (+) B/T   Filed Weights   09/26/21 0500 09/26/21 1620 09/26/21 2030  Weight: 102.7 kg 96.2 kg 91 kg    Intake/Output Summary (Last 24 hours) at 09/27/2021 1240 Last data filed at 09/26/2021 2157 Gross per 24 hour  Intake 220 ml  Output 5500 ml  Net -5280 ml    Additional Objective Labs: Basic Metabolic Panel: Recent Labs  Lab 09/23/21 1159 09/25/21 0717 09/26/21 1639  NA 125* 129* 129*  K 5.3* 5.1 4.7  CL 92* 92* 95*  CO2 23 27 24   GLUCOSE 145* 131* 91  BUN 47* 42* 37*  CREATININE 5.92* 5.36* 4.40*  CALCIUM 8.3* 8.7* 8.8*  PHOS 5.7* 4.8* 4.7*   Liver Function Tests: Recent Labs  Lab 09/23/21 1159 09/25/21 0717 09/26/21 1639  ALBUMIN 2.4* 2.7* 2.5*   No results for input(s): "LIPASE", "AMYLASE" in the last 168 hours. CBC: Recent Labs  Lab 09/23/21 1200 09/25/21 0717 09/26/21 1639  WBC 7.3 7.2 7.3  HGB 8.6* 8.9* 9.2*  HCT 27.0* 28.5* 29.0*  MCV 93.1 94.1 94.5  PLT 193 199 213   Blood Culture    Component Value Date/Time   SDES  TISSUE 08/28/2021 0917   SPECREQUEST LEFT THIGH TISSUE 08/28/2021 0917   CULT  08/28/2021 0917    MODERATE ENTEROCOCCUS FAECIUM VANCOMYCIN RESISTANT ENTEROCOCCUS ISOLATED NO ANAEROBES ISOLATED Sent to [place] for further susceptibility testing. Performed at [facility], [place] [address], [place], [place] [address]    REPTSTATUS 09/02/2021 FINAL 08/28/2021 0917    Cardiac Enzymes: Recent Labs  Lab 09/27/21 0446  CKTOTAL 26*   CBG: Recent Labs  Lab 09/26/21 0737 09/26/21 1127 09/26/21 2136 09/27/21 0718 09/27/21 1128  GLUCAP 77 162* 188* 136* 212*   Iron Studies: No results for input(s): "IRON", "TIBC", "TRANSFERRIN", "FERRITIN" in the last 72 hours. Lab Results  Component Value Date   INR 1.1 12/28/2019   INR 1.2 12/17/2018   INR 0.99 04/07/2017   Studies/Results: No results found.  Medications:  sodium chloride     sodium chloride 10 mL/hr at 09/19/21 1310   DAPTOmycin (CUBICIN) 900 mg in sodium chloride 0.9 % IVPB Stopped (09/25/21 2145)   methocarbamol (ROBAXIN) IV      alosetron  1 mg Oral BID   amitriptyline  25 mg Oral QHS   amLODipine  10 mg Oral Q24H   atorvastatin  40 mg Oral q1800   carvedilol  6.25 mg Oral BID   Chlorhexidine Gluconate Cloth  6 each Topical Q0600   cyclobenzaprine  10 mg Oral TID   darbepoetin (ARANESP) injection - DIALYSIS  200 mcg Intravenous Q Fri-HD   docusate sodium  100 mg Oral BID   DULoxetine  60 mg Oral Daily   famotidine  20 mg Oral Daily   furosemide  80 mg Oral Daily   heparin  5,000 Units Subcutaneous Q8H   heparin sodium (porcine)       hydrALAZINE  50 mg Oral TID   insulin aspart  0-6 Units Subcutaneous TID WC   insulin aspart  5 Units Subcutaneous TID WC   insulin glargine-yfgn  12 Units Subcutaneous QHS   multivitamin  1 tablet Oral QHS   pantoprazole  80 mg Oral Daily   sevelamer carbonate  1,600 mg Oral TID WC   sodium chloride flush  3 mL Intravenous Q12H    Dialysis Orders: [facility] MWF 4h  400/500   90kg  2/2 bath  P2  Heparin 4000 units IV TIW  LUA AVF  Assessment/Plan:  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 HD 09/27/2021 START DAILY HD 3.5 HOURS DAILY. Continue to encourage to sit in chair for HD although this is truly not a criteria for discharge. Please see # [phone].   AVF Access: Issues with clots, cannulation. To OR 09/19/2021 per Dr. Virl Cagey for branch ligation. Didn't see any issues documented from yesterday's treatment-monitor. Plan for HD today. Will schedule for HD 7/29. HTN/ volume: BP elevated.  Remains volume overloaded.  If weights correct 10kg over dry, max UF as tolerated with HD. Remind of importance of following fluid restrictions. Increased amlodipine to 10mg  qd and continue carvedilol 6.25mg  bid. Do not understand how she is repeatedly gaining 5 Liters between EVERY treatment then uses excess volume as excuse not to get in chair. Strict fluid restrictions and I & O.  Anemia of ESRD- last HGB 9.2. s/p 2 units pRBC on 6/29. Aranesp 246mcg given 7/21.Transfuse if HGB <7.0.  Secondary HPTH: CCa and PO4 in range. Cont binders.  DM2: Insulin per primary Dispo: Patient has issues with no handicap ramp at her home. OP HD clinic cannot help her get in and out of her car when she arrives to HD. SNF recommended.  Last admission she was supposed to go to SNF but on day of discharge, she refused SNF placement. Very difficult situation.  Not clear what chance of success she will have as OP regardless of her ability to sit in chair if her home situation is not improved.  Have reached out to renal MSW.  Tobie Poet, NP [facility] 09/27/2021,12:40 PM  LOS: 32 days

## 2021-09-28 DIAGNOSIS — I10 Essential (primary) hypertension: Secondary | ICD-10-CM | POA: Diagnosis not present

## 2021-09-28 DIAGNOSIS — E785 Hyperlipidemia, unspecified: Secondary | ICD-10-CM | POA: Diagnosis not present

## 2021-09-28 DIAGNOSIS — F419 Anxiety disorder, unspecified: Secondary | ICD-10-CM | POA: Diagnosis not present

## 2021-09-28 DIAGNOSIS — M86252 Subacute osteomyelitis, left femur: Secondary | ICD-10-CM | POA: Diagnosis not present

## 2021-09-28 LAB — GLUCOSE, CAPILLARY
Glucose-Capillary: 82 mg/dL (ref 70–99)
Glucose-Capillary: 166 mg/dL — ABNORMAL HIGH (ref 70–99)
Glucose-Capillary: 204 mg/dL — ABNORMAL HIGH (ref 70–99)
Glucose-Capillary: 148 mg/dL — ABNORMAL HIGH (ref 70–99)

## 2021-09-28 LAB — HEPATITIS B SURFACE ANTIGEN: Hepatitis B Surface Ag: NONREACTIVE

## 2021-09-28 LAB — COMPREHENSIVE METABOLIC PANEL
ALT: 9 U/L (ref 0–44)
AST: 18 U/L (ref 15–41)
Albumin: 2.5 g/dL — ABNORMAL LOW (ref 3.5–5.0)
Alkaline Phosphatase: 101 U/L (ref 38–126)
Anion gap: 8 (ref 5–15)
BUN: 34 mg/dL — ABNORMAL HIGH (ref 6–20)
CO2: 30 mmol/L (ref 22–32)
Calcium: 8.8 mg/dL — ABNORMAL LOW (ref 8.9–10.3)
Chloride: 97 mmol/L — ABNORMAL LOW (ref 98–111)
Creatinine, Ser: 4.21 mg/dL — ABNORMAL HIGH (ref 0.44–1.00)
GFR, Estimated: 12 mL/min — ABNORMAL LOW (ref >60)
Glucose, Bld: 105 mg/dL — ABNORMAL HIGH (ref 70–99)
Potassium: 4.5 mmol/L (ref 3.5–5.1)
Sodium: 135 mmol/L (ref 135–145)
Total Bilirubin: 0.4 mg/dL (ref 0.3–1.2)
Total Protein: 6.5 g/dL (ref 6.5–8.1)

## 2021-09-28 MED ORDER — PENTAFLUOROPROP-TETRAFLUOROETH EX AERO
INHALATION_SPRAY | CUTANEOUS | Status: AC
  Filled 2021-09-28: qty 30

## 2021-09-28 MED ORDER — DARBEPOETIN ALFA 200 MCG/0.4ML IJ SOSY
200.0000 ug | PREFILLED_SYRINGE | Freq: Once | INTRAMUSCULAR | Status: AC
Start: 2021-09-28 — End: 2021-09-28
  Administered 2021-09-28: 200 ug via SUBCUTANEOUS
  Filled 2021-09-28: qty 0.4

## 2021-09-28 MED ORDER — SODIUM CHLORIDE 0.9 % IV SOLN
900.0000 mg | Freq: Once | INTRAVENOUS | Status: AC
  Administered 2021-09-28: 900 mg via INTRAVENOUS
  Filled 2021-09-28 (×2): qty 18

## 2021-09-28 MED ORDER — DARBEPOETIN ALFA 200 MCG/0.4ML IJ SOSY
200.0000 ug | PREFILLED_SYRINGE | INTRAMUSCULAR | Status: DC
Start: 2021-10-04 — End: 2021-09-29

## 2021-09-28 MED ORDER — SODIUM CHLORIDE 0.9 % IV SOLN
10.0000 mg/kg | Freq: Every day | INTRAVENOUS | Status: DC
  Administered 2021-09-30: 900 mg via INTRAVENOUS
  Filled 2021-09-28 (×2): qty 18

## 2021-09-28 MED ORDER — HEPARIN SODIUM (PORCINE) 1000 UNIT/ML IJ SOLN
INTRAMUSCULAR | Status: AC
  Administered 2021-09-28: 1000 [IU] via INTRAVENOUS_CENTRAL
  Filled 2021-09-28: qty 2

## 2021-09-28 MED ORDER — DARBEPOETIN ALFA 200 MCG/0.4ML IJ SOSY: 200.0000 ug | PREFILLED_SYRINGE | Freq: Once | INTRAMUSCULAR | Status: DC

## 2021-09-28 NOTE — Progress Notes (Signed)
Post HD Note  Received patient in bed, alert and oriented. Informed consent signed and in chart.    Time tx initiated: 0845 Time tx completed: 1215  HD treatment completed. Patient tolerated well. Fistula/Graft/HD catheter without signs and symptoms of complications. Patient transported back to the room, alert and orient and in no acute distress. Report given to bedside RN.   Total UF removed: 4000 Medication given: n/a Post HD VS: 142/66, 97% 66,  Post HD weight: 89.1kg   Christianne Dolin Dialysis Nurse

## 2021-09-28 NOTE — Progress Notes (Signed)
PROGRESS NOTE    [name]  CWC:[id number] DOB: 07-17-1965 DOA: 08/26/2021 PCP: [name], MD    Brief Narrative:  56 year old woman PMH including ESRD, complicated left hip and femur issues requiring multiple surgeries, removal of hardware presented 6/26 with bloody drainage from surgical incision.  Seen by orthopedics and underwent I&D 6/28 and 7/1.  Currently unable to progress care secondary to refusal to sit in chair for HD.   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  Active Problems:   Closed intertrochanteric fracture of hip, left, sequela   ESRD (end stage renal disease) on dialysis ([place])   Uncontrolled type 2 diabetes mellitus with hypoglycemia, with long-term current use of insulin (HCC)   History of CVA (cerebrovascular accident)   Benign essential HTN   Dyslipidemia, goal LDL below 100   Anxiety and depression   GASTROESOPHAGEAL REFLUX, NO ESOPHAGITIS   TOBACCO DEPENDENCE   Wound infection   Iron deficiency anemia, unspecified   Wound dehiscence, surgical, sequela   Left femoral shaft fracture (HCC)  Subacute osteomyelitis of the left femur with abscess, infected left hip hardware status post removal, left intertrochanteric fracture --Wound VAC to the left outer thigh per orthopedics, continue wound care, cleared by Dr. [name] for discharge; follow-up with orthopedics as an outpatient --per ID daptomycin for 6 weeks with dialysis, stop date 8/12. Follow-up with Dr. Candiss Norse    ESRD --Dialysis MWF.  Patient needs to be able to sit in chair to tolerate outpatient dialysis for discharge --Nephrology managing --s/p fistula revision and branch ligation. -Cont to not tolerate HD. Discussed with Nephrology - recommendations for SNF as pt would not be safe for home d/c (no ramp to get into home) -Cont fluid restriction -Seen on HD today   Anemia of chronic disease, component of acute blood loss anemia from  wound --Status post 2 unit packed red blood cells 6/29 --Hemoglobin stable    Uncontrolled type 2 diabetes with hypoglycemia --A1c 7.1 --CBG stable. Continue Semglee, NovoLog, sliding scale. CBG stable  Essential hypertension --stable. Continue amlodipine, Coreg, hydralazine   Hyperlipidemia --continue Lipitor    DVT prophylaxis: heparin subq Code Status: Full Family Communication: Pt in room, family not at bedside  Status is: Inpatient Remains inpatient appropriate because: Severity of illness   Consultants:  Nephrology Orthopedic Surgery  Antimicrobials: Anti-infectives (From admission, onward)    Start     Dose/Rate Route Frequency Ordered Stop   09/30/21 2000  DAPTOmycin (CUBICIN) 900 mg in sodium chloride 0.9 % IVPB        10 mg/kg  92.4 kg 136 mL/hr over 30 Minutes Intravenous Daily 09/28/21 0919     09/28/21 2000  DAPTOmycin (CUBICIN) 900 mg in sodium chloride 0.9 % IVPB        900 mg 136 mL/hr over 30 Minutes Intravenous  Once 09/28/21 0919     09/02/21 2000  DAPTOmycin (CUBICIN) 900 mg in sodium chloride 0.9 % IVPB  Status:  Discontinued        10 mg/kg  92.4 kg 136 mL/hr over 30 Minutes Intravenous Once per day on Mon Wed Fri 09/02/21 0836 09/28/21 0919   08/31/21 0745  ceFAZolin (ANCEF) IVPB 2g/100 mL premix        2 g 200 mL/hr over 30 Minutes Intravenous To Short Stay 08/31/21 0730 08/31/21 0918   08/30/21 0600  ceFAZolin (ANCEF) IVPB 2g/100 mL premix  Status:  Discontinued  2 g 200 mL/hr over 30 Minutes Intravenous To Short Stay 08/30/21 0123 08/31/21 0600   08/29/21 2000  DAPTOmycin (CUBICIN) 900 mg in sodium chloride 0.9 % IVPB  Status:  Discontinued        10 mg/kg  92.4 kg 136 mL/hr over 30 Minutes Intravenous Every 48 hours 08/29/21 1627 09/02/21 0836   08/28/21 1800  ceFAZolin (ANCEF) IVPB 2g/100 mL premix  Status:  Discontinued        2 g 200 mL/hr over 30 Minutes Intravenous Every M-W-F (1800) 08/27/21 0846 08/29/21 1620   08/28/21  1200  ceFAZolin (ANCEF) IVPB 1 g/50 mL premix  Status:  Discontinued        1 g 100 mL/hr over 30 Minutes Intravenous Every M-W-F (Hemodialysis) 08/26/21 1954 08/27/21 0846   08/28/21 0600  ceFAZolin (ANCEF) IVPB 2g/100 mL premix        2 g 200 mL/hr over 30 Minutes Intravenous On call to O.R. 08/27/21 1150 08/28/21 0909   08/27/21 1800  ceFAZolin (ANCEF) IVPB 1 g/50 mL premix        1 g 100 mL/hr over 30 Minutes Intravenous  Once 08/26/21 1954 08/27/21 2048   08/26/21 1500  ceFEPIme (MAXIPIME) 1 g in sodium chloride 0.9 % 100 mL IVPB  Status:  Discontinued        1 g 200 mL/hr over 30 Minutes Intravenous Every 24 hours 08/26/21 1452 08/26/21 1714       Subjective: Seen on HD this AM, without complaints  Objective: Vitals:   09/28/21 1152 09/28/21 1208 09/28/21 1215 09/28/21 1317  BP: (!) 127/57 116/60 (!) 142/66 116/62  Pulse: 66 66 66 69  Resp: 11 11 10 16   Temp:    98.2 F (36.8 C)  TempSrc:      SpO2: 95% 95% 97% 100%  Weight:   89.1 kg   Height:        Intake/Output Summary (Last 24 hours) at 09/28/2021 1524 Last data filed at 09/28/2021 1339 Gross per 24 hour  Intake 824 ml  Output 4800 ml  Net -3976 ml    Filed Weights   09/27/21 1303 09/28/21 0840 09/28/21 1215  Weight: 93.5 kg 93.3 kg 89.1 kg    Examination: General exam: Conversant, in no acute distress Respiratory system: normal chest rise, clear, no audible wheezing Cardiovascular system: regular rhythm, s1-s2 Gastrointestinal system: Nondistended, nontender, pos BS Central nervous system: No seizures, no tremors Extremities: No cyanosis, no joint deformities Skin: No rashes, no pallor Psychiatry: Affect normal // no auditory hallucinations   Data Reviewed: I have personally reviewed following labs and imaging studies  CBC: Recent Labs  Lab 09/23/21 1200 09/25/21 0717 09/26/21 1639  WBC 7.3 7.2 7.3  HGB 8.6* 8.9* 9.2*  HCT 27.0* 28.5* 29.0*  MCV 93.1 94.1 94.5  PLT 193 199 213    Basic  Metabolic Panel: Recent Labs  Lab 09/23/21 1159 09/25/21 0717 09/26/21 1639 09/28/21 0223  NA 125* 129* 129* 135  K 5.3* 5.1 4.7 4.5  CL 92* 92* 95* 97*  CO2 23 27 24 30   GLUCOSE 145* 131* 91 105*  BUN 47* 42* 37* 34*  CREATININE 5.92* 5.36* 4.40* 4.21*  CALCIUM 8.3* 8.7* 8.8* 8.8*  PHOS 5.7* 4.8* 4.7*  --     GFR: Estimated Creatinine Clearance: 18.5 mL/min (A) (by C-G formula based on SCr of 4.21 mg/dL (H)). Liver Function Tests: Recent Labs  Lab 09/23/21 1159 09/25/21 0717 09/26/21 1639 09/28/21 0223  AST  --   --   --  18  ALT  --   --   --  9  ALKPHOS  --   --   --  101  BILITOT  --   --   --  0.4  PROT  --   --   --  6.5  ALBUMIN 2.4* 2.7* 2.5* 2.5*    No results for input(s): "LIPASE", "AMYLASE" in the last 168 hours. No results for input(s): "AMMONIA" in the last 168 hours. Coagulation Profile: No results for input(s): "INR", "PROTIME" in the last 168 hours. Cardiac Enzymes: Recent Labs  Lab 09/27/21 0446  CKTOTAL 26*    BNP (last 3 results) No results for input(s): "PROBNP" in the last 8760 hours. HbA1C: No results for input(s): "HGBA1C" in the last 72 hours. CBG: Recent Labs  Lab 09/27/21 1128 09/27/21 1627 09/27/21 2052 09/28/21 0718 09/28/21 1315  GLUCAP 212* 97 231* 82 166*    Lipid Profile: No results for input(s): "CHOL", "HDL", "LDLCALC", "TRIG", "CHOLHDL", "LDLDIRECT" in the last 72 hours. Thyroid Function Tests: No results for input(s): "TSH", "T4TOTAL", "FREET4", "T3FREE", "THYROIDAB" in the last 72 hours. Anemia Panel: No results for input(s): "VITAMINB12", "FOLATE", "FERRITIN", "TIBC", "IRON", "RETICCTPCT" in the last 72 hours. Sepsis Labs: No results for input(s): "PROCALCITON", "LATICACIDVEN" in the last 168 hours.  No results found for this or any previous visit (from the past 240 hour(s)).   Radiology Studies: No results found.  Scheduled Meds:  alosetron  1 mg Oral BID   amitriptyline  25 mg Oral QHS   amLODipine   10 mg Oral Q24H   carvedilol  6.25 mg Oral BID   Chlorhexidine Gluconate Cloth  6 each Topical Q0600   cyclobenzaprine  10 mg Oral TID   darbepoetin (ARANESP) injection - DIALYSIS  200 mcg Subcutaneous Once   [START ON 10/04/2021] darbepoetin (ARANESP) injection - DIALYSIS  200 mcg Subcutaneous Q Fri-HD   docusate sodium  100 mg Oral BID   DULoxetine  60 mg Oral Daily   famotidine  20 mg Oral Daily   furosemide  80 mg Oral Daily   heparin  5,000 Units Subcutaneous Q8H   hydrALAZINE  50 mg Oral TID   insulin aspart  0-6 Units Subcutaneous TID WC   insulin aspart  5 Units Subcutaneous TID WC   insulin glargine-yfgn  12 Units Subcutaneous QHS   multivitamin  1 tablet Oral QHS   pantoprazole  80 mg Oral Daily   pentafluoroprop-tetrafluoroeth       sevelamer carbonate  1,600 mg Oral TID WC   sodium chloride flush  3 mL Intravenous Q12H   Continuous Infusions:  sodium chloride     sodium chloride 10 mL/hr at 09/19/21 1310   DAPTOmycin (CUBICIN) 900 mg in sodium chloride 0.9 % IVPB     [START ON 09/30/2021] DAPTOmycin (CUBICIN) 900 mg in sodium chloride 0.9 % IVPB     methocarbamol (ROBAXIN) IV       LOS: 33 days   Marylu Lund, MD [facility] Pager On Amion  If 7PM-7AM, please contact night-coverage 09/28/2021, 3:24 PM

## 2021-09-28 NOTE — Progress Notes (Addendum)
Pharmacy Antibiotic Note  [name] is a 56 y.o. female admitted on 08/26/2021  with L-hip PJI. Prior intra-op cultures in May grew MSSA, now with intra-op cultures this admission growing Enterococcus faecium (VRE). Pharmacy has been consulted for Daptomycin dosing.  Per nephrology 7/28, "ESRD - on HD MWF. Unable to complete HD yesterday d/t noted clots from cannulation. Successful cannulation today and so far running smoothly. Plan not to schedule HD tomorrow as Sunday we use for emergent needs. Plan to resume daily HD 3.5 hrs on Monday 09/30/21."  Daptomycin is currently scheduled after HD on HD days.  Each HD is expected to remove ~15% of the drug.      CK Mondays and Thursdays w/ increased frequency of daptomycin.  Has been 21-39, stable.  Plan: Continue Daptomycin 900 mg (~10 mg/kg) IV after HD. Will schedule dose tonight and daily starting 7/31.  Currently planning HD daily. Continue to monitor that patient received dialysis daily.  Weekly CK monitoring on Fridays. Next due 8/4. Anticipated Daptomycin stop date 10/12/21 per ID.  Height: 5\' 10"  (177.8 cm) Weight: 93.3 kg (205 lb 11 oz) IBW/kg (Calculated) : 68.5  Temp (24hrs), Avg:98.3 F (36.8 C), Min:97.9 F (36.6 C), Max:98.6 F (37 C)  Recent Labs  Lab 09/23/21 1159 09/23/21 1200 09/25/21 0717 09/26/21 1639 09/28/21 0223  WBC  --  7.3 7.2 7.3  --   CREATININE 5.92*  --  5.36* 4.40* 4.21*     Estimated Creatinine Clearance: 18.9 mL/min (A) (by C-G formula based on SCr of 4.21 mg/dL (H)).    Allergies  Allergen Reactions   Trazodone Swelling   Latex Rash   Lidocaine Itching    Antimicrobials this admission: Cefepime x 1 in ED 6/26 Cefazolin 6/13>> 6/29 Daptomycin 6/29>>(8/12)  Microbiology results: 6/10 L hip tissue: MSSA, no anaerobes seen 6/28 L thigh tissue: E.faecium (R amp/vanc, S to linezolid) 6/28 MRSA PCR: positive for Staph, negative for MRSA  Thank you for allowing pharmacy to be a part of  this patient's care.  Jeneen Rinks, Pharm.D PGY1 Pharmacy Resident 09/28/2021 9:05 AM _____________________________________________________________________ I discussed / reviewed the pharmacy note by Dr. Robyne Askew and I agree with the resident's findings and plans as documented. Vaughan Basta BS, PharmD, BCPS Clinical Pharmacist 09/28/2021 10:49 AM  Contact: [phone] after 3 PM  "Be curious, not judgmental..." -Jamal Maes _____________________________________________________________________

## 2021-09-28 NOTE — Progress Notes (Signed)
Pt in hemodialysis

## 2021-09-28 NOTE — Progress Notes (Signed)
[facility] Progress Note   Subjective:    Seen and examined patient on HD. Unable to complete HD yesterday d/t noted clots with cannulation. Noted successful cannulation today and so far machine running smoothly. No clots noted as of now. Will monitor closely. Goal to remove as much as we can.  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 Compression hose RLE, +trace-1+ edema RLE L hip Dialysis Access: L AVF (+) B/T   Filed Weights   09/26/21 2030 09/27/21 1303 09/28/21 0846  Weight: 91 kg 93.5 kg 93.3 kg    Intake/Output Summary (Last 24 hours) at 09/28/2021 0856 Last data filed at 09/28/2021 0200 Gross per 24 hour  Intake 1184 ml  Output 600 ml  Net 584 ml    Additional Objective Labs: Basic Metabolic Panel: Recent Labs  Lab 09/23/21 1159 09/25/21 0717 09/26/21 1639 09/28/21 0223  NA 125* 129* 129* 135  K 5.3* 5.1 4.7 4.5  CL 92* 92* 95* 97*  CO2 23 27 24 30   GLUCOSE 145* 131* 91 105*  BUN 47* 42* 37* 34*  CREATININE 5.92* 5.36* 4.40* 4.21*  CALCIUM 8.3* 8.7* 8.8* 8.8*  PHOS 5.7* 4.8* 4.7*  --    Liver Function Tests: Recent Labs  Lab 09/25/21 0717 09/26/21 1639 09/28/21 0223  AST  --   --  18  ALT  --   --  9  ALKPHOS  --   --  101  BILITOT  --   --  0.4  PROT  --   --  6.5  ALBUMIN 2.7* 2.5* 2.5*   No results for input(s): "LIPASE", "AMYLASE" in the last 168 hours. CBC: Recent Labs  Lab 09/23/21 1200 09/25/21 0717 09/26/21 1639  WBC 7.3 7.2 7.3  HGB  8.6* 8.9* 9.2*  HCT 27.0* 28.5* 29.0*  MCV 93.1 94.1 94.5  PLT 193 199 213   Blood Culture    Component Value Date/Time   SDES TISSUE 08/28/2021 0917   SPECREQUEST LEFT THIGH TISSUE 08/28/2021 0917   CULT  08/28/2021 0917    MODERATE ENTEROCOCCUS FAECIUM VANCOMYCIN RESISTANT ENTEROCOCCUS ISOLATED NO ANAEROBES ISOLATED Sent to [place] for further susceptibility testing. Performed at [facility], [place] [address] [place], [place] [address]    REPTSTATUS 09/02/2021 FINAL 08/28/2021 0917    Cardiac Enzymes: Recent Labs  Lab 09/27/21 0446  CKTOTAL 26*   CBG: Recent Labs  Lab 09/27/21 0718 09/27/21 1128 09/27/21 1627 09/27/21 2052 09/28/21 0718  GLUCAP 136* 212* 97 231* 82   Iron Studies: No results for input(s): "IRON", "TIBC", "TRANSFERRIN", "FERRITIN" in the last 72 hours. Lab Results  Component Value Date   INR 1.1 12/28/2019   INR 1.2 12/17/2018   INR 0.99 04/07/2017   Studies/Results: No results found.  Medications:  sodium chloride     sodium chloride 10 mL/hr at 09/19/21 1310   DAPTOmycin (CUBICIN) 900 mg in sodium  chloride 0.9 % IVPB Stopped (09/27/21 2212)   methocarbamol (ROBAXIN) IV      alosetron  1 mg Oral BID   amitriptyline  25 mg Oral QHS   amLODipine  10 mg Oral Q24H   atorvastatin  40 mg Oral q1800   carvedilol  6.25 mg Oral BID   Chlorhexidine Gluconate Cloth  6 each Topical Q0600   cyclobenzaprine  10 mg Oral TID   darbepoetin (ARANESP) injection - DIALYSIS  200 mcg Intravenous Q Fri-HD   docusate sodium  100 mg Oral BID   DULoxetine  60 mg Oral Daily   famotidine  20 mg Oral Daily   furosemide  80 mg Oral Daily   heparin  5,000 Units Subcutaneous Q8H   hydrALAZINE  50 mg Oral TID   insulin aspart  0-6 Units Subcutaneous TID WC   insulin aspart  5 Units Subcutaneous TID WC   insulin glargine-yfgn  12 Units Subcutaneous QHS   multivitamin  1 tablet Oral QHS   pantoprazole  80 mg Oral Daily   pentafluoroprop-tetrafluoroeth        sevelamer carbonate  1,600 mg Oral TID WC   sodium chloride flush  3 mL Intravenous Q12H    Dialysis Orders: [facility] MWF 4h  400/500  90kg  2/2 bath  P2  Heparin 4000 units IV TIW  LUA AVF  Assessment/Plan:  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 Unable to complete HD yesterday d/t noted clots from cannulation. Successful cannulation today and so far running smoothly. Plan not to schedule HD tomorrow as Sunday we use for emergent needs. Plan to resume daily HD 3.5 hrs on Monday 09/30/21. Continue to encourage to sit in chair for HD although this is truly not a criteria for discharge. Please see # [phone].   AVF Access: Issues with clots, cannulation. To OR 09/19/2021 per Dr. Virl Cagey for branch ligation. Didn't see any issues documented from yesterday's treatment-monitor. No HD yesterday d/t cannulation issues-see above. On HD today and plan to resume daily HD on Monday 09/30/21. HTN/ volume: BP elevated.  Remains volume overloaded.  If weights correct 10kg over dry, max UF as tolerated with HD. Remind of importance of following fluid restrictions. Increased amlodipine to 10mg  qd and continue carvedilol 6.25mg  bid. Do not understand how she is repeatedly gaining 5 Liters between EVERY treatment then uses excess volume as excuse not to get in chair. Strict fluid restrictions and I & O.  Anemia of ESRD- last HGB 9.2. s/p 2 units pRBC on 6/29. Aranesp 237mcg given 7/21.Transfuse if HGB <7.0.  Secondary HPTH: CCa and PO4 in range. Cont binders.  DM2: Insulin per primary Dispo: Patient has issues with no handicap ramp at her home. OP HD clinic cannot help her get in and out of her car when she arrives to HD. SNF recommended.  Last admission she was supposed to go to SNF but on day of discharge, she refused SNF placement. Very difficult  situation.  Not clear what chance of success she will have as OP regardless of her ability to sit in chair if her home situation is not improved.  Have reached out to renal MSW.  Tobie Poet, NP [facility] 09/28/2021,8:56 AM  LOS: 33 days

## 2021-09-29 DIAGNOSIS — M86252 Subacute osteomyelitis, left femur: Secondary | ICD-10-CM | POA: Diagnosis not present

## 2021-09-29 LAB — GLUCOSE, CAPILLARY
Glucose-Capillary: 105 mg/dL — ABNORMAL HIGH (ref 70–99)
Glucose-Capillary: 184 mg/dL — ABNORMAL HIGH (ref 70–99)
Glucose-Capillary: 63 mg/dL — ABNORMAL LOW (ref 70–99)
Glucose-Capillary: 87 mg/dL (ref 70–99)
Glucose-Capillary: 185 mg/dL — ABNORMAL HIGH (ref 70–99)

## 2021-09-29 LAB — RENAL FUNCTION PANEL
Albumin: 2.7 g/dL — ABNORMAL LOW (ref 3.5–5.0)
Anion gap: 7 (ref 5–15)
BUN: 30 mg/dL — ABNORMAL HIGH (ref 6–20)
CO2: 28 mmol/L (ref 22–32)
Calcium: 8.7 mg/dL — ABNORMAL LOW (ref 8.9–10.3)
Chloride: 96 mmol/L — ABNORMAL LOW (ref 98–111)
Creatinine, Ser: 3.91 mg/dL — ABNORMAL HIGH (ref 0.44–1.00)
GFR, Estimated: 13 mL/min — ABNORMAL LOW (ref >60)
Glucose, Bld: 140 mg/dL — ABNORMAL HIGH (ref 70–99)
Phosphorus: 4.3 mg/dL (ref 2.5–4.6)
Potassium: 4.4 mmol/L (ref 3.5–5.1)
Sodium: 131 mmol/L — ABNORMAL LOW (ref 135–145)

## 2021-09-29 LAB — CBC
HCT: 30.9 % — ABNORMAL LOW (ref 36.0–46.0)
Hemoglobin: 9.8 g/dL — ABNORMAL LOW (ref 12.0–15.0)
MCH: 29.6 pg (ref 26.0–34.0)
MCHC: 31.7 g/dL (ref 30.0–36.0)
MCV: 93.4 fL (ref 80.0–100.0)
Platelets: 205 10*3/uL (ref 150–400)
RBC: 3.31 MIL/uL — ABNORMAL LOW (ref 3.87–5.11)
RDW: 15.2 % (ref 11.5–15.5)
WBC: 9.7 10*3/uL (ref 4.0–10.5)
nRBC: 0 % (ref 0.0–0.2)

## 2021-09-29 MED ORDER — DARBEPOETIN ALFA 200 MCG/0.4ML IJ SOSY: 200.0000 ug | PREFILLED_SYRINGE | INTRAMUSCULAR | Status: DC

## 2021-09-29 MED ORDER — PENTAFLUOROPROP-TETRAFLUOROETH EX AERO: 1.0000 | INHALATION_SPRAY | CUTANEOUS | Status: DC | PRN

## 2021-09-29 MED ORDER — ALTEPLASE 2 MG IJ SOLR: 2.0000 mg | Freq: Once | INTRAMUSCULAR | Status: DC | PRN

## 2021-09-29 MED ORDER — HEPARIN SODIUM (PORCINE) 1000 UNIT/ML DIALYSIS: 20.0000 [IU]/kg | INTRAMUSCULAR | Status: DC | PRN

## 2021-09-29 MED ORDER — HEPARIN SODIUM (PORCINE) 1000 UNIT/ML DIALYSIS: 1000.0000 [IU] | INTRAMUSCULAR | Status: DC | PRN

## 2021-09-29 NOTE — Progress Notes (Signed)
PROGRESS NOTE    [name]  OIZ:[id number] DOB: 1965-07-07 DOA: 08/26/2021 PCP: [name], MD    Brief Narrative:  56 year old woman PMH including ESRD, complicated left hip and femur issues requiring multiple surgeries, removal of hardware presented 6/26 with bloody drainage from surgical incision.  Seen by orthopedics and underwent I&D 6/28 and 7/1.  Currently unable to progress care secondary to refusal to sit in chair for HD.   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  Active Problems:   Closed intertrochanteric fracture of hip, left, sequela   ESRD (end stage renal disease) on dialysis ([place])   Uncontrolled type 2 diabetes mellitus with hypoglycemia, with long-term current use of insulin (HCC)   History of CVA (cerebrovascular accident)   Benign essential HTN   Dyslipidemia, goal LDL below 100   Anxiety and depression   GASTROESOPHAGEAL REFLUX, NO ESOPHAGITIS   TOBACCO DEPENDENCE   Wound infection   Iron deficiency anemia, unspecified   Wound dehiscence, surgical, sequela   Left femoral shaft fracture (HCC)  Subacute osteomyelitis of the left femur with abscess, infected left hip hardware status post removal, left intertrochanteric fracture --Wound VAC to the left outer thigh per orthopedics, continue wound care, cleared by Dr. [name] for discharge; follow-up with orthopedics as an outpatient --per ID daptomycin for 6 weeks with dialysis, stop date 8/12. Follow-up with Dr. Candiss Norse    ESRD --Dialysis MWF.  Patient needs to be able to sit in chair to tolerate outpatient dialysis for discharge --Nephrology managing --s/p fistula revision and branch ligation. -Cont to not tolerate HD. Discussed with Nephrology - recommendations for SNF as pt would not be safe for home d/c (no ramp to get into home) -Cont fluid restriction -Plan HD tomorrow   Anemia of chronic disease, component of acute blood loss anemia from  wound --Status post 2 unit packed red blood cells 6/29 --Hemoglobin stable    Uncontrolled type 2 diabetes with hypoglycemia --A1c 7.1 --CBG stable. Continue Semglee, NovoLog, sliding scale. CBG stable  Essential hypertension --stable. Continue amlodipine, Coreg, hydralazine   Hyperlipidemia --continue Lipitor    DVT prophylaxis: heparin subq Code Status: Full Family Communication: Pt in room, family not at bedside  Status is: Inpatient Remains inpatient appropriate because: Severity of illness   Consultants:  Nephrology Orthopedic Surgery  Antimicrobials: Anti-infectives (From admission, onward)    Start     Dose/Rate Route Frequency Ordered Stop   09/30/21 2000  DAPTOmycin (CUBICIN) 900 mg in sodium chloride 0.9 % IVPB        10 mg/kg  92.4 kg 136 mL/hr over 30 Minutes Intravenous Daily 09/28/21 0919     09/28/21 2000  DAPTOmycin (CUBICIN) 900 mg in sodium chloride 0.9 % IVPB        900 mg 136 mL/hr over 30 Minutes Intravenous  Once 09/28/21 0919 09/28/21 2146   09/02/21 2000  DAPTOmycin (CUBICIN) 900 mg in sodium chloride 0.9 % IVPB  Status:  Discontinued        10 mg/kg  92.4 kg 136 mL/hr over 30 Minutes Intravenous Once per day on Mon Wed Fri 09/02/21 0836 09/28/21 0919   08/31/21 0745  ceFAZolin (ANCEF) IVPB 2g/100 mL premix        2 g 200 mL/hr over 30 Minutes Intravenous To Short Stay 08/31/21 0730 08/31/21 0918   08/30/21 0600  ceFAZolin (ANCEF) IVPB 2g/100 mL premix  Status:  Discontinued  2 g 200 mL/hr over 30 Minutes Intravenous To Short Stay 08/30/21 0123 08/31/21 0600   08/29/21 2000  DAPTOmycin (CUBICIN) 900 mg in sodium chloride 0.9 % IVPB  Status:  Discontinued        10 mg/kg  92.4 kg 136 mL/hr over 30 Minutes Intravenous Every 48 hours 08/29/21 1627 09/02/21 0836   08/28/21 1800  ceFAZolin (ANCEF) IVPB 2g/100 mL premix  Status:  Discontinued        2 g 200 mL/hr over 30 Minutes Intravenous Every M-W-F (1800) 08/27/21 0846 08/29/21 1620    08/28/21 1200  ceFAZolin (ANCEF) IVPB 1 g/50 mL premix  Status:  Discontinued        1 g 100 mL/hr over 30 Minutes Intravenous Every M-W-F (Hemodialysis) 08/26/21 1954 08/27/21 0846   08/28/21 0600  ceFAZolin (ANCEF) IVPB 2g/100 mL premix        2 g 200 mL/hr over 30 Minutes Intravenous On call to O.R. 08/27/21 1150 08/28/21 0909   08/27/21 1800  ceFAZolin (ANCEF) IVPB 1 g/50 mL premix        1 g 100 mL/hr over 30 Minutes Intravenous  Once 08/26/21 1954 08/27/21 2048   08/26/21 1500  ceFEPIme (MAXIPIME) 1 g in sodium chloride 0.9 % 100 mL IVPB  Status:  Discontinued        1 g 200 mL/hr over 30 Minutes Intravenous Every 24 hours 08/26/21 1452 08/26/21 1714       Subjective: Without complaints this AM. States swelling seems improved today  Objective: Vitals:   09/28/21 1707 09/28/21 2102 09/29/21 0544 09/29/21 0948  BP: 136/64 (!) 131/52 (!) 163/57 (!) 141/62  Pulse: 70 68 75 74  Resp: 18 18 18 18   Temp: 98.1 F (36.7 C) 98.9 F (37.2 C) 99.9 F (37.7 C)   TempSrc:  Oral    SpO2: 96% 97% 100% 99%  Weight:      Height:        Intake/Output Summary (Last 24 hours) at 09/29/2021 1524 Last data filed at 09/29/2021 1000 Gross per 24 hour  Intake 1160 ml  Output 350 ml  Net 810 ml    Filed Weights   09/27/21 1303 09/28/21 0840 09/28/21 1215  Weight: 93.5 kg 93.3 kg 89.1 kg    Examination: General exam: Awake, laying in bed, in nad Respiratory system: Normal respiratory effort, no wheezing Cardiovascular system: regular rate, s1, s2 Gastrointestinal system: Soft, nondistended, positive BS Central nervous system: CN2-12 grossly intact, strength intact Extremities: Perfused, no clubbing Skin: Normal skin turgor, no notable skin lesions seen Psychiatry: Mood normal // no visual hallucinations   Data Reviewed: I have personally reviewed following labs and imaging studies  CBC: Recent Labs  Lab 09/23/21 1200 09/25/21 0717 09/26/21 1639 09/29/21 1317  WBC 7.3 7.2  7.3 9.7  HGB 8.6* 8.9* 9.2* 9.8*  HCT 27.0* 28.5* 29.0* 30.9*  MCV 93.1 94.1 94.5 93.4  PLT 193 199 213 664    Basic Metabolic Panel: Recent Labs  Lab 09/23/21 1159 09/25/21 0717 09/26/21 1639 09/28/21 0223 09/29/21 1317  NA 125* 129* 129* 135 131*  K 5.3* 5.1 4.7 4.5 4.4  CL 92* 92* 95* 97* 96*  CO2 23 27 24 30 28   GLUCOSE 145* 131* 91 105* 140*  BUN 47* 42* 37* 34* 30*  CREATININE 5.92* 5.36* 4.40* 4.21* 3.91*  CALCIUM 8.3* 8.7* 8.8* 8.8* 8.7*  PHOS 5.7* 4.8* 4.7*  --  4.3    GFR: Estimated Creatinine Clearance: 19.9 mL/min (A) (  by C-G formula based on SCr of 3.91 mg/dL (H)). Liver Function Tests: Recent Labs  Lab 09/23/21 1159 09/25/21 0717 09/26/21 1639 09/28/21 0223 09/29/21 1317  AST  --   --   --  18  --   ALT  --   --   --  9  --   ALKPHOS  --   --   --  101  --   BILITOT  --   --   --  0.4  --   PROT  --   --   --  6.5  --   ALBUMIN 2.4* 2.7* 2.5* 2.5* 2.7*    No results for input(s): "LIPASE", "AMYLASE" in the last 168 hours. No results for input(s): "AMMONIA" in the last 168 hours. Coagulation Profile: No results for input(s): "INR", "PROTIME" in the last 168 hours. Cardiac Enzymes: Recent Labs  Lab 09/27/21 0446  CKTOTAL 26*    BNP (last 3 results) No results for input(s): "PROBNP" in the last 8760 hours. HbA1C: No results for input(s): "HGBA1C" in the last 72 hours. CBG: Recent Labs  Lab 09/28/21 1315 09/28/21 1705 09/28/21 2101 09/29/21 0723 09/29/21 1116  GLUCAP 166* 204* 148* 105* 184*    Lipid Profile: No results for input(s): "CHOL", "HDL", "LDLCALC", "TRIG", "CHOLHDL", "LDLDIRECT" in the last 72 hours. Thyroid Function Tests: No results for input(s): "TSH", "T4TOTAL", "FREET4", "T3FREE", "THYROIDAB" in the last 72 hours. Anemia Panel: No results for input(s): "VITAMINB12", "FOLATE", "FERRITIN", "TIBC", "IRON", "RETICCTPCT" in the last 72 hours. Sepsis Labs: No results for input(s): "PROCALCITON", "LATICACIDVEN" in the  last 168 hours.  No results found for this or any previous visit (from the past 240 hour(s)).   Radiology Studies: No results found.  Scheduled Meds:  alosetron  1 mg Oral BID   amitriptyline  25 mg Oral QHS   amLODipine  10 mg Oral Q24H   carvedilol  6.25 mg Oral BID   Chlorhexidine Gluconate Cloth  6 each Topical Q0600   cyclobenzaprine  10 mg Oral TID   [START ON 10/05/2021] darbepoetin (ARANESP) injection - DIALYSIS  200 mcg Subcutaneous Q Sat-1800   docusate sodium  100 mg Oral BID   DULoxetine  60 mg Oral Daily   famotidine  20 mg Oral Daily   furosemide  80 mg Oral Daily   heparin  5,000 Units Subcutaneous Q8H   hydrALAZINE  50 mg Oral TID   insulin aspart  0-6 Units Subcutaneous TID WC   insulin aspart  5 Units Subcutaneous TID WC   insulin glargine-yfgn  12 Units Subcutaneous QHS   multivitamin  1 tablet Oral QHS   pantoprazole  80 mg Oral Daily   sevelamer carbonate  1,600 mg Oral TID WC   sodium chloride flush  3 mL Intravenous Q12H   Continuous Infusions:  sodium chloride     sodium chloride 10 mL/hr at 09/19/21 1310   [START ON 09/30/2021] DAPTOmycin (CUBICIN) 900 mg in sodium chloride 0.9 % IVPB     methocarbamol (ROBAXIN) IV       LOS: 34 days   Marylu Lund, MD [facility] Pager On Amion  If 7PM-7AM, please contact night-coverage 09/29/2021, 3:24 PM

## 2021-09-29 NOTE — Progress Notes (Signed)
[facility] Progress Note   Subjective:    Seen and examined patient at bedside. No acute complaints. Successful cannulation yesterday and tolerated UF 4L. On strict I&O's and weights improving. Plan to resume daily HD on 7/31.  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 Compression hose RLE, +trace-1+ edema RLE L hip Dialysis Access: L AVF (+) B/T   Filed Weights   09/27/21 1303 09/28/21 0840 09/28/21 1215  Weight: 93.5 kg 93.3 kg 89.1 kg    Intake/Output Summary (Last 24 hours) at 09/29/2021 1121 Last data filed at 09/29/2021 1000 Gross per 24 hour  Intake 1640 ml  Output 4750 ml  Net -3110 ml    Additional Objective Labs: Basic Metabolic Panel: Recent Labs  Lab 09/23/21 1159 09/25/21 0717 09/26/21 1639 09/28/21 0223  NA 125* 129* 129* 135  K 5.3* 5.1 4.7 4.5  CL 92* 92* 95* 97*  CO2 23 27 24 30   GLUCOSE 145* 131* 91 105*  BUN 47* 42* 37* 34*  CREATININE 5.92* 5.36* 4.40* 4.21*  CALCIUM 8.3* 8.7* 8.8* 8.8*  PHOS 5.7* 4.8* 4.7*  --    Liver Function Tests: Recent Labs  Lab 09/25/21 0717 09/26/21 1639 09/28/21 0223  AST  --   --  18  ALT  --   --  9  ALKPHOS  --   --  101  BILITOT  --   --  0.4  PROT  --   --  6.5  ALBUMIN 2.7* 2.5* 2.5*   No results for input(s): "LIPASE", "AMYLASE" in the last 168 hours. CBC: Recent Labs  Lab 09/23/21 1200 09/25/21 0717 09/26/21 1639  WBC 7.3 7.2 7.3  HGB 8.6* 8.9* 9.2*  HCT 27.0* 28.5* 29.0*  MCV 93.1 94.1 94.5   PLT 193 199 213   Blood Culture    Component Value Date/Time   SDES TISSUE 08/28/2021 0917   SPECREQUEST LEFT THIGH TISSUE 08/28/2021 0917   CULT  08/28/2021 0917    MODERATE ENTEROCOCCUS FAECIUM VANCOMYCIN RESISTANT ENTEROCOCCUS ISOLATED NO ANAEROBES ISOLATED Sent to [facility] for further susceptibility testing. Performed at [facility], [place] [address] [place], [place] [address]    REPTSTATUS 09/02/2021 FINAL 08/28/2021 0917    Cardiac Enzymes: Recent Labs  Lab 09/27/21 0446  CKTOTAL 26*   CBG: Recent Labs  Lab 09/28/21 1315 09/28/21 1705 09/28/21 2101 09/29/21 0723 09/29/21 1116  GLUCAP 166* 204* 148* 105* 184*   Iron Studies: No results for input(s): "IRON", "TIBC", "TRANSFERRIN", "FERRITIN" in the last 72 hours. Lab Results  Component Value Date   INR 1.1 12/28/2019   INR 1.2 12/17/2018   INR 0.99 04/07/2017   Studies/Results: No results found.  Medications:  sodium chloride     sodium chloride 10 mL/hr at 09/19/21 1310   [START ON 09/30/2021] DAPTOmycin (CUBICIN) 900 mg in sodium chloride 0.9 % IVPB  methocarbamol (ROBAXIN) IV      alosetron  1 mg Oral BID   amitriptyline  25 mg Oral QHS   amLODipine  10 mg Oral Q24H   carvedilol  6.25 mg Oral BID   Chlorhexidine Gluconate Cloth  6 each Topical Q0600   cyclobenzaprine  10 mg Oral TID   [START ON 10/05/2021] darbepoetin (ARANESP) injection - DIALYSIS  200 mcg Subcutaneous Q Sat-1800   docusate sodium  100 mg Oral BID   DULoxetine  60 mg Oral Daily   famotidine  20 mg Oral Daily   furosemide  80 mg Oral Daily   heparin  5,000 Units Subcutaneous Q8H   hydrALAZINE  50 mg Oral TID   insulin aspart  0-6 Units Subcutaneous TID WC   insulin aspart  5 Units Subcutaneous TID WC   insulin glargine-yfgn  12 Units Subcutaneous QHS   multivitamin  1 tablet Oral QHS   pantoprazole  80 mg Oral Daily   sevelamer carbonate  1,600 mg Oral TID WC   sodium chloride flush  3 mL Intravenous Q12H     Dialysis Orders: [facility] MWF 4h  400/500  90kg  2/2 bath  P2  Heparin 4000 units IV TIW  LUA AVF  Assessment/Plan:   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 Successful cannulation yesterday. No HD today we use Sunday for emergent needs. Plan to resume daily HD 3.5 hrs on Monday 09/30/21. Continue to encourage to sit in chair for HD although this is truly not a criteria for discharge. Please see # [phone].   AVF Access: Issues with clots, cannulation. To OR 09/19/2021 per Dr. Virl Cagey for branch ligation. Didn't see any issues documented from yesterday's treatment-monitor.  HTN/ volume: BP and volume are improving. On strict I&Os and getting closer to EDW. Use max UF as tolerated with HD. Remind of importance of following fluid restrictions. Increased amlodipine to 10mg  qd and continue carvedilol 6.25mg  bid. Do not understand how she is repeatedly gaining 5 Liters between EVERY treatment then uses excess volume as excuse not to get in chair.  Anemia of ESRD- last HGB 9.2. s/p 2 units pRBC on 6/29. Aranesp 259mcg given 7/21.Transfuse if HGB <7.0.  Secondary HPTH: CCa and PO4 in range. Cont binders.  DM2: Insulin per primary Dispo: Patient has issues with no handicap ramp at her home. OP HD clinic cannot help her get in and out of her car when she arrives to HD. SNF recommended.  Last admission she was supposed to go to SNF but on day of discharge, she refused SNF placement. Very difficult situation.  Not clear what chance of success she will have as OP regardless of her ability to sit in chair if her home situation is not improved.  Have reached out to renal MSW.  Tobie Poet, NP [facility] 09/29/2021,11:21 AM  LOS: 34 days

## 2021-09-30 DIAGNOSIS — I10 Essential (primary) hypertension: Secondary | ICD-10-CM | POA: Diagnosis not present

## 2021-09-30 DIAGNOSIS — M86252 Subacute osteomyelitis, left femur: Secondary | ICD-10-CM | POA: Diagnosis not present

## 2021-09-30 DIAGNOSIS — E785 Hyperlipidemia, unspecified: Secondary | ICD-10-CM | POA: Diagnosis not present

## 2021-09-30 LAB — GLUCOSE, CAPILLARY
Glucose-Capillary: 102 mg/dL — ABNORMAL HIGH (ref 70–99)
Glucose-Capillary: 141 mg/dL — ABNORMAL HIGH (ref 70–99)
Glucose-Capillary: 218 mg/dL — ABNORMAL HIGH (ref 70–99)

## 2021-09-30 LAB — CK: Total CK: 25 U/L — ABNORMAL LOW (ref 38–234)

## 2021-09-30 NOTE — Progress Notes (Signed)
PROGRESS NOTE    [name]  PQZ:[id number] DOB: 10-09-1965 DOA: 08/26/2021 PCP: [name], MD    Brief Narrative:  56 year old woman PMH including ESRD, complicated left hip and femur issues requiring multiple surgeries, removal of hardware presented 6/26 with bloody drainage from surgical incision.  Seen by orthopedics and underwent I&D 6/28 and 7/1.  Currently unable to progress care secondary to refusal to sit in chair for HD.   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  Active Problems:   Closed intertrochanteric fracture of hip, left, sequela   ESRD (end stage renal disease) on dialysis ([place])   Uncontrolled type 2 diabetes mellitus with hypoglycemia, with long-term current use of insulin (HCC)   History of CVA (cerebrovascular accident)   Benign essential HTN   Dyslipidemia, goal LDL below 100   Anxiety and depression   GASTROESOPHAGEAL REFLUX, NO ESOPHAGITIS   TOBACCO DEPENDENCE   Wound infection   Iron deficiency anemia, unspecified   Wound dehiscence, surgical, sequela   Left femoral shaft fracture (HCC)  Subacute osteomyelitis of the left femur with abscess, infected left hip hardware status post removal, left intertrochanteric fracture --Wound VAC to the left outer thigh per orthopedics, continue wound care, cleared by Dr. [name] for discharge; follow-up with orthopedics as an outpatient --per ID daptomycin for 6 weeks with dialysis, stop date 8/12. Follow-up with Dr. Candiss Norse    ESRD --Dialysis MWF.  Patient needs to be able to sit in chair to tolerate outpatient dialysis for discharge --Nephrology managing --s/p fistula revision and branch ligation. -Cont to not tolerate HD. Discussed with Nephrology - recommendations for SNF as pt would not be safe for home d/c (no ramp to get into home) -Cont fluid restriction -Seen on HD today -Concern that pt has no ramp at home, thus outpt HD unable to get pt in and out of car  when she arrives to HD, thus SNF recommended   Anemia of chronic disease, component of acute blood loss anemia from wound --Status post 2 unit packed red blood cells 6/29 --Hemoglobin stable    Uncontrolled type 2 diabetes with hypoglycemia --A1c 7.1 --CBG stable. Continue Semglee, NovoLog, sliding scale. CBG stable  Essential hypertension --stable. Continue amlodipine, Coreg, hydralazine   Hyperlipidemia --continue Lipitor    DVT prophylaxis: heparin subq Code Status: Full Family Communication: Pt in room, family not at bedside  Status is: Inpatient Remains inpatient appropriate because: Severity of illness   Consultants:  Nephrology Orthopedic Surgery  Antimicrobials: Anti-infectives (From admission, onward)    Start     Dose/Rate Route Frequency Ordered Stop   09/30/21 2000  DAPTOmycin (CUBICIN) 900 mg in sodium chloride 0.9 % IVPB        10 mg/kg  92.4 kg 136 mL/hr over 30 Minutes Intravenous Daily 09/28/21 0919     09/28/21 2000  DAPTOmycin (CUBICIN) 900 mg in sodium chloride 0.9 % IVPB        900 mg 136 mL/hr over 30 Minutes Intravenous  Once 09/28/21 0919 09/28/21 2146   09/02/21 2000  DAPTOmycin (CUBICIN) 900 mg in sodium chloride 0.9 % IVPB  Status:  Discontinued        10 mg/kg  92.4 kg 136 mL/hr over 30 Minutes Intravenous Once per day on Mon Wed Fri 09/02/21 0836 09/28/21 0919   08/31/21 0745  ceFAZolin (ANCEF) IVPB 2g/100 mL premix        2 g 200 mL/hr over 30 Minutes Intravenous  To Short Stay 08/31/21 0730 08/31/21 0918   08/30/21 0600  ceFAZolin (ANCEF) IVPB 2g/100 mL premix  Status:  Discontinued        2 g 200 mL/hr over 30 Minutes Intravenous To Short Stay 08/30/21 0123 08/31/21 0600   08/29/21 2000  DAPTOmycin (CUBICIN) 900 mg in sodium chloride 0.9 % IVPB  Status:  Discontinued        10 mg/kg  92.4 kg 136 mL/hr over 30 Minutes Intravenous Every 48 hours 08/29/21 1627 09/02/21 0836   08/28/21 1800  ceFAZolin (ANCEF) IVPB 2g/100 mL premix   Status:  Discontinued        2 g 200 mL/hr over 30 Minutes Intravenous Every M-W-F (1800) 08/27/21 0846 08/29/21 1620   08/28/21 1200  ceFAZolin (ANCEF) IVPB 1 g/50 mL premix  Status:  Discontinued        1 g 100 mL/hr over 30 Minutes Intravenous Every M-W-F (Hemodialysis) 08/26/21 1954 08/27/21 0846   08/28/21 0600  ceFAZolin (ANCEF) IVPB 2g/100 mL premix        2 g 200 mL/hr over 30 Minutes Intravenous On call to O.R. 08/27/21 1150 08/28/21 0909   08/27/21 1800  ceFAZolin (ANCEF) IVPB 1 g/50 mL premix        1 g 100 mL/hr over 30 Minutes Intravenous  Once 08/26/21 1954 08/27/21 2048   08/26/21 1500  ceFEPIme (MAXIPIME) 1 g in sodium chloride 0.9 % 100 mL IVPB  Status:  Discontinued        1 g 200 mL/hr over 30 Minutes Intravenous Every 24 hours 08/26/21 1452 08/26/21 1714       Subjective: Seen on HD. Denies sob or chest pains  Objective: Vitals:   09/30/21 1330 09/30/21 1400 09/30/21 1436 09/30/21 1527  BP: (!) 131/56 124/85 122/63 (!) 145/58  Pulse: 69 73 80 70  Resp: 13 18 17 18   Temp:   98.1 F (36.7 C) 98 F (36.7 C)  TempSrc:      SpO2: 98% 95% 99% 100%  Weight:   91.3 kg   Height:        Intake/Output Summary (Last 24 hours) at 09/30/2021 1639 Last data filed at 09/30/2021 1436 Gross per 24 hour  Intake 840 ml  Output 654 ml  Net 186 ml    Filed Weights   09/28/21 0840 09/28/21 1215 09/30/21 1436  Weight: 93.3 kg 89.1 kg 91.3 kg    Examination: General exam: Conversant, in no acute distress Respiratory system: normal chest rise, clear, no audible wheezing Cardiovascular system: regular rhythm, s1-s2 Gastrointestinal system: Nondistended, nontender, pos BS Central nervous system: No seizures, no tremors Extremities: No cyanosis, no joint deformities Skin: No rashes, no pallor Psychiatry: Affect normal // no auditory hallucinations   Data Reviewed: I have personally reviewed following labs and imaging studies  CBC: Recent Labs  Lab 09/25/21 0717  09/26/21 1639 09/29/21 1317  WBC 7.2 7.3 9.7  HGB 8.9* 9.2* 9.8*  HCT 28.5* 29.0* 30.9*  MCV 94.1 94.5 93.4  PLT 199 213 124    Basic Metabolic Panel: Recent Labs  Lab 09/25/21 0717 09/26/21 1639 09/28/21 0223 09/29/21 1317  NA 129* 129* 135 131*  K 5.1 4.7 4.5 4.4  CL 92* 95* 97* 96*  CO2 27 24 30 28   GLUCOSE 131* 91 105* 140*  BUN 42* 37* 34* 30*  CREATININE 5.36* 4.40* 4.21* 3.91*  CALCIUM 8.7* 8.8* 8.8* 8.7*  PHOS 4.8* 4.7*  --  4.3    GFR: Estimated Creatinine  Clearance: 20.1 mL/min (A) (by C-G formula based on SCr of 3.91 mg/dL (H)). Liver Function Tests: Recent Labs  Lab 09/25/21 0717 09/26/21 1639 09/28/21 0223 09/29/21 1317  AST  --   --  18  --   ALT  --   --  9  --   ALKPHOS  --   --  101  --   BILITOT  --   --  0.4  --   PROT  --   --  6.5  --   ALBUMIN 2.7* 2.5* 2.5* 2.7*    No results for input(s): "LIPASE", "AMYLASE" in the last 168 hours. No results for input(s): "AMMONIA" in the last 168 hours. Coagulation Profile: No results for input(s): "INR", "PROTIME" in the last 168 hours. Cardiac Enzymes: Recent Labs  Lab 09/27/21 0446 09/30/21 0258  CKTOTAL 26* 25*    BNP (last 3 results) No results for input(s): "PROBNP" in the last 8760 hours. HbA1C: No results for input(s): "HGBA1C" in the last 72 hours. CBG: Recent Labs  Lab 09/29/21 1116 09/29/21 1652 09/29/21 1729 09/29/21 2102 09/30/21 0714  GLUCAP 184* 63* 87 185* 102*    Lipid Profile: No results for input(s): "CHOL", "HDL", "LDLCALC", "TRIG", "CHOLHDL", "LDLDIRECT" in the last 72 hours. Thyroid Function Tests: No results for input(s): "TSH", "T4TOTAL", "FREET4", "T3FREE", "THYROIDAB" in the last 72 hours. Anemia Panel: No results for input(s): "VITAMINB12", "FOLATE", "FERRITIN", "TIBC", "IRON", "RETICCTPCT" in the last 72 hours. Sepsis Labs: No results for input(s): "PROCALCITON", "LATICACIDVEN" in the last 168 hours.  No results found for this or any previous visit  (from the past 240 hour(s)).   Radiology Studies: No results found.  Scheduled Meds:  alosetron  1 mg Oral BID   amitriptyline  25 mg Oral QHS   amLODipine  10 mg Oral Q24H   carvedilol  6.25 mg Oral BID   cyclobenzaprine  10 mg Oral TID   [START ON 10/05/2021] darbepoetin (ARANESP) injection - DIALYSIS  200 mcg Subcutaneous Q Sat-1800   docusate sodium  100 mg Oral BID   DULoxetine  60 mg Oral Daily   famotidine  20 mg Oral Daily   furosemide  80 mg Oral Daily   heparin  5,000 Units Subcutaneous Q8H   hydrALAZINE  50 mg Oral TID   insulin aspart  0-6 Units Subcutaneous TID WC   insulin aspart  5 Units Subcutaneous TID WC   insulin glargine-yfgn  12 Units Subcutaneous QHS   multivitamin  1 tablet Oral QHS   pantoprazole  80 mg Oral Daily   sevelamer carbonate  1,600 mg Oral TID WC   sodium chloride flush  3 mL Intravenous Q12H   Continuous Infusions:  sodium chloride     sodium chloride 10 mL/hr at 09/19/21 1310   DAPTOmycin (CUBICIN) 900 mg in sodium chloride 0.9 % IVPB     methocarbamol (ROBAXIN) IV       LOS: 35 days   Marylu Lund, MD [facility] Pager On Amion  If 7PM-7AM, please contact night-coverage 09/30/2021, 4:39 PM

## 2021-09-30 NOTE — Progress Notes (Addendum)
[facility] Progress Note   Subjective:   Patient seen and examined at bedside in dialysis. Tolerating treatment well so far.  Denies CP, SOB, abdominal pain and n/v/d.   Objective Vitals:   09/30/21 0930 09/30/21 0936 09/30/21 1000 09/30/21 1029  BP: (!) 128/58 125/81 (!) 140/54 (!) 155/62  Pulse: 66 65 70 68  Resp: 15 16 15 14   Temp: 98.3 F (36.8 C)     TempSrc: Oral     SpO2: 100% 99% 96% 97%  Weight:      Height:       Physical Exam General:chronically ill appearing female in NAD Heart:RRR, no MRG Lungs:CTAB, nml WOB on RA Abdomen:soft, NTND Extremities:L BKA with wound vac L hip. Trace RLE edema, 1+ edema in R hip/flank Dialysis Access: LU AVF +b/t   Filed Weights   09/27/21 1303 09/28/21 0840 09/28/21 1215  Weight: 93.5 kg 93.3 kg 89.1 kg    Intake/Output Summary (Last 24 hours) at 09/30/2021 1047 Last data filed at 09/30/2021 0800 Gross per 24 hour  Intake 1040 ml  Output 650 ml  Net 390 ml    Additional Objective Labs: Basic Metabolic Panel: Recent Labs  Lab 09/25/21 0717 09/26/21 1639 09/28/21 0223 09/29/21 1317  NA 129* 129* 135 131*  K 5.1 4.7 4.5 4.4  CL 92* 95* 97* 96*  CO2 27 24 30 28   GLUCOSE 131* 91 105* 140*  BUN 42* 37* 34* 30*  CREATININE 5.36* 4.40* 4.21* 3.91*  CALCIUM 8.7* 8.8* 8.8* 8.7*  PHOS 4.8* 4.7*  --  4.3   Liver Function Tests: Recent Labs  Lab 09/26/21 1639 09/28/21 0223 09/29/21 1317  AST  --  18  --   ALT  --  9  --   ALKPHOS  --  101  --   BILITOT  --  0.4  --   PROT  --  6.5  --   ALBUMIN 2.5* 2.5* 2.7*   CBC: Recent Labs  Lab 09/23/21 1200 09/25/21 0717 09/26/21 1639 09/29/21 1317  WBC 7.3 7.2 7.3 9.7  HGB 8.6* 8.9* 9.2* 9.8*  HCT 27.0* 28.5* 29.0* 30.9*  MCV 93.1 94.1 94.5 93.4  PLT 193 199 213 205   Cardiac Enzymes: Recent Labs  Lab 09/27/21 0446 09/30/21 0258  CKTOTAL 26* 25*   CBG: Recent Labs  Lab 09/29/21 1116 09/29/21 1652 09/29/21 1729 09/29/21 2102  09/30/21 0714  GLUCAP 184* 63* 87 185* 102*   Medications:  sodium chloride     sodium chloride 10 mL/hr at 09/19/21 1310   DAPTOmycin (CUBICIN) 900 mg in sodium chloride 0.9 % IVPB     methocarbamol (ROBAXIN) IV      alosetron  1 mg Oral BID   amitriptyline  25 mg Oral QHS   amLODipine  10 mg Oral Q24H   carvedilol  6.25 mg Oral BID   cyclobenzaprine  10 mg Oral TID   [START ON 10/05/2021] darbepoetin (ARANESP) injection - DIALYSIS  200 mcg Subcutaneous Q Sat-1800   docusate sodium  100 mg Oral BID   DULoxetine  60 mg Oral Daily   famotidine  20 mg Oral Daily   furosemide  80 mg Oral Daily   heparin  5,000 Units Subcutaneous Q8H   hydrALAZINE  50 mg Oral TID   insulin aspart  0-6 Units Subcutaneous TID WC   insulin aspart  5 Units Subcutaneous TID WC   insulin glargine-yfgn  12 Units Subcutaneous QHS   multivitamin  1 tablet Oral  QHS   pantoprazole  80 mg Oral Daily   sevelamer carbonate  1,600 mg Oral TID WC   sodium chloride flush  3 mL Intravenous Q12H    Dialysis Orders: [facility] MWF 4h  400/500  90kg  2/2 bath  P2  Heparin 4000 units IV TIW  LUA AVF   Assessment/Plan:    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 Successful cannulation yesterday. Successful cannulation today.  Continue to encourage to sit in chair for HD although this is not her biggest obstacle for discharge. Please see # [phone].   AVF Access: Issues with clots, cannulation. To OR 09/19/2021 per Dr. Virl Cagey for branch ligation. Tolerating HD well today using AVF.   HTN/ volume: BP and volume are improving. Increased amlodipine to 10mg  qd and continue carvedilol 6.25mg  bid. On strict I&Os and getting closer to EDW. Continues to have large gains, use max UF as tolerated with HD. Remind of importance of following fluid restrictions. .  Anemia  of ESRD- last HGB 9.9. s/p 2 units pRBC on 6/29. Aranesp 251mcg given 7/21.Transfuse if HGB <7.0.  Secondary HPTH: CCa and PO4 in range. Cont binders.  DM2: Insulin per primary Dispo: Patient has issues with no handicap ramp at her home. OP HD clinic cannot help her get in and out of her car when she arrives to HD. SNF recommended.  Last admission she was supposed to go to SNF but on day of discharge, she refused SNF placement. Very difficult situation.  Not clear what chance of success she will have as OP regardless of her ability to sit in chair if her home situation is not improved.  Have reached out to renal MSW.  Jen Mow, PA-C [facility] 09/30/2021,10:47 AM  LOS: 35 days   Nephrology attending: The patient was seen and examined in dialysis unit.  Chart reviewed and I agree with above. ESRD on HD admitted with left hip infection with VRE on daptomycin per ID.  Continue dialysis treatment scheduled.  Difficult discharge condition, social worker is following.  Next dialysis on Wednesday.  Katheran James,  [facility].

## 2021-09-30 NOTE — TOC Progression Note (Addendum)
Transition of Care [facility]) - Progression Note    Patient Details  Name: [name] MRN: [id number] Date of Birth: 1965-07-13  [facility] CM/SW Contact  Tom-Johnson, Renea Ee, RN Phone Number: 09/30/2021, 4:31 PM  Clinical Narrative:     Patient continues to decline sitting up in chair for dialysis due to left hip pain/swelling. Continues on IV abx till 10/12/21. Per MD, plan is 1200 fluid restrictions and diuresis. CM will continue to follow as patient progresses with care.   Expected Discharge Plan: [place] Barriers to Discharge: Continued Medical Work up  Expected Discharge Plan and Services Expected Discharge Plan: [place]   Discharge Planning Services: CM Consult   Living arrangements for the past 2 months: Single Family Home                                       Social Determinants of Health (SDOH) Interventions    Readmission Risk Interventions    07/26/2021    3:15 PM  Readmission Risk Prevention Plan  Transportation Screening Complete  PCP or Specialist Appt within 3-5 Days Complete  HRI or Home Care Consult Complete  Palliative Care Screening Not Applicable  Medication Review (RN Care Manager) Referral to Pharmacy

## 2021-09-30 NOTE — Plan of Care (Signed)
  Problem: Tissue Perfusion: Goal: Adequacy of tissue perfusion will improve Outcome: Completed/Met   Problem: Health Behavior/Discharge Planning: Goal: Ability to manage health-related needs will improve Outcome: Completed/Met   Problem: Clinical Measurements: Goal: Will remain free from infection Outcome: Completed/Met   Problem: Skin Integrity: Goal: Risk for impaired skin integrity will decrease Outcome: Completed/Met

## 2021-10-01 DIAGNOSIS — M86252 Subacute osteomyelitis, left femur: Secondary | ICD-10-CM | POA: Diagnosis not present

## 2021-10-01 DIAGNOSIS — E785 Hyperlipidemia, unspecified: Secondary | ICD-10-CM | POA: Diagnosis not present

## 2021-10-01 LAB — GLUCOSE, CAPILLARY
Glucose-Capillary: 141 mg/dL — ABNORMAL HIGH (ref 70–99)
Glucose-Capillary: 137 mg/dL — ABNORMAL HIGH (ref 70–99)
Glucose-Capillary: 99 mg/dL (ref 70–99)
Glucose-Capillary: 159 mg/dL — ABNORMAL HIGH (ref 70–99)

## 2021-10-01 MED ORDER — SODIUM CHLORIDE 0.9 % IV SOLN
10.0000 mg/kg | INTRAVENOUS | Status: DC
  Administered 2021-10-02 – 2021-10-11 (×5): 900 mg via INTRAVENOUS
  Filled 2021-10-01 (×5): qty 18

## 2021-10-01 MED ORDER — CHLORHEXIDINE GLUCONATE CLOTH 2 % EX PADS: 6.0000 | MEDICATED_PAD | Freq: Every day | CUTANEOUS | Status: DC

## 2021-10-01 NOTE — Progress Notes (Signed)
Occupational Therapy Treatment Patient Details Name: [name] MRN: [id number] DOB: [birth year] Today's Date: 10/01/2021   History of present illness 56 y.o. female presented to ED 6/26 from dialysis with increased bloody drainage from L hip wound. 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 s/p hardware removal of left hip with partial resection of tuft tissue and femure that were nonviable on 08/10/21 Discharged home 6/23. underwent L hip debridement and wound vac placement 6/28; +Left intertrochanteric non-union,  Fracture of left distal femur  PMH: hypertension, hyperlipidemia, ESRD on HD MWF, history of left BKA in 2021, depression/anxiety, stroke, tobacco use, T2DM,  insomnia, chronic pain syndrome,   OT comments  Patient received in supine and agreeable to OT/PT treatment with patient stating pain at RLE knee. Discussed addressing transfer to recliner with stand pivot and using sliding board to return to EOB. Patient stating she doesn't believe she can due to pain. Patient performed standing with patient attempting to "shimmy" RLE towards chair to further address transfers. Patient able to perform 4 stands tolerating 50-65 seconds with mod to max assist of 2. Acute OT to continue to follow.    Recommendations for follow up therapy are one component of a multi-disciplinary discharge planning process, led by the attending physician.  Recommendations may be updated based on patient status, additional functional criteria and insurance authorization.    Follow Up Recommendations  Skilled nursing-short term rehab (<3 hours/day)    Assistance Recommended at Discharge Intermittent Supervision/Assistance  Patient can return home with the following  A lot of help with bathing/dressing/bathroom;Assistance with cooking/housework;Assist for transportation;Help with stairs or ramp for entrance;Two people to help with walking and/or transfers   Equipment Recommendations   Other (comment) (drop arm commode)    Recommendations for Other Services      Precautions / Restrictions Precautions Precautions: Fall;Other (comment) Precaution Comments: L BKA (baseline) Restrictions Weight Bearing Restrictions: Yes LLE Weight Bearing: Non weight bearing Other Position/Activity Restrictions: L distal femur fx--non union       Mobility Bed Mobility Overal bed mobility: Needs Assistance Bed Mobility: Supine to Sit, Sit to Supine   Sidelying to sit: HOB elevated, Supervision   Sit to supine: Supervision   General bed mobility comments: increased time and effort with signs of pain at RLE knee    Transfers Overall transfer level: Needs assistance Equipment used: Rolling walker (2 wheels) Transfers: Sit to/from Stand Sit to Stand: Mod assist, +2 physical assistance, Max assist           General transfer comment: performed 4 stands from EOB with patient tolerating 50-65     Balance Overall balance assessment: Needs assistance Sitting-balance support: Feet supported Sitting balance-Leahy Scale: Good Sitting balance - Comments: no assistance with sitting balance   Standing balance support: Bilateral upper extremity supported, During functional activity, Reliant on assistive device for balance Standing balance-Leahy Scale: Poor Standing balance comment: stood for 65 sec                           ADL either performed or assessed with clinical judgement   ADL Overall ADL's : Needs assistance/impaired     Grooming: Wash/dry hands;Wash/dry face;Sitting;Set up                                 General ADL Comments: able to maintain balance sitting on EOB    Extremity/Trunk  Assessment              Vision       Perception     Praxis      Cognition Arousal/Alertness: Awake/alert Behavior During Therapy: WFL for tasks assessed/performed Overall Cognitive Status: Within Functional Limits for tasks assessed                                           Exercises      Shoulder Instructions       General Comments      Pertinent Vitals/ Pain       Pain Assessment Pain Assessment: Faces Faces Pain Scale: Hurts even more Pain Location: rt knee Pain Descriptors / Indicators: Discomfort, Aching, Grimacing, Guarding, Moaning Pain Intervention(s): Limited activity within patient's tolerance, Monitored during session, Repositioned  Home Living                                          Prior Functioning/Environment              Frequency  Min 2X/week        Progress Toward Goals  OT Goals(current goals can now be found in the care plan section)  Progress towards OT goals: Progressing toward goals  Acute Rehab OT Goals Patient Stated Goal: go home OT Goal Formulation: With patient Time For Goal Achievement: 10/08/21 Potential to Achieve Goals: Fair ADL Goals Pt Will Perform Grooming: with modified independence;sitting Pt Will Perform Lower Body Bathing: with min assist;sitting/lateral leans Pt Will Perform Lower Body Dressing: with min assist;sitting/lateral leans Pt Will Transfer to Toilet: bedside commode;with transfer board;with supervision Pt Will Perform Toileting - Clothing Manipulation and hygiene: with min assist;sitting/lateral leans Pt/caregiver will Perform Home Exercise Program: Increased strength;Both right and left upper extremity;With theraband;Independently;With written HEP provided Additional ADL Goal #1: Pt will perform bed mobility modified independently in preparation for ADLs with HOB flat.  Plan Discharge plan remains appropriate;Frequency remains appropriate    Co-evaluation    PT/OT/SLP Co-Evaluation/Treatment: Yes Reason for Co-Treatment: Complexity of the patient's impairments (multi-system involvement);For patient/therapist safety;To address functional/ADL transfers PT goals addressed during session: Mobility/safety with  mobility;Balance;Proper use of DME OT goals addressed during session: Strengthening/ROM      AM-PAC OT "6 Clicks" Daily Activity     Outcome Measure   Help from another person eating meals?: None Help from another person taking care of personal grooming?: A Little Help from another person toileting, which includes using toliet, bedpan, or urinal?: Total Help from another person bathing (including washing, rinsing, drying)?: A Lot Help from another person to put on and taking off regular upper body clothing?: A Little Help from another person to put on and taking off regular lower body clothing?: A Lot 6 Click Score: 15    End of Session Equipment Utilized During Treatment: Gait belt;Rolling walker (2 wheels)  OT Visit Diagnosis: Muscle weakness (generalized) (M62.81);Unsteadiness on feet (R26.81);History of falling (Z91.81)   Activity Tolerance Patient tolerated treatment well   Patient Left in bed;with call bell/phone within reach;with bed alarm set   Nurse Communication Mobility status        Time: 9163-8466 OT Time Calculation (min): 35 min  Charges: OT General Charges $OT Visit: 1 Visit OT Treatments $Therapeutic Activity: 8-22 mins  Liliane Channel  Roxy Manns, [place]  Office [place] 10/01/2021, 11:34 AM

## 2021-10-01 NOTE — Progress Notes (Signed)
Physical Therapy Treatment Patient Details Name: [name] MRN: [id number] DOB: [birth year] Today's Date: 10/01/2021   History of Present Illness 56 y.o. female presented to ED 6/26 from dialysis with increased bloody drainage from L hip wound. 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 s/p hardware removal of left hip with partial resection of tuft tissue and femure that were nonviable on 08/10/21 Discharged home 6/23. underwent L hip debridement and wound vac placement 6/28; +Left intertrochanteric non-union,  Fracture of left distal femur  PMH: hypertension, hyperlipidemia, ESRD on HD MWF, history of left BKA in 2021, depression/anxiety, stroke, tobacco use, T2DM,  insomnia, chronic pain syndrome,    PT Comments    Patient continues to work on standing with +2  mod-max assist with goal of pivoting to recliner. Patient currently limited by increased RIGHT knee pain with weight-bearing, limiting her ability to pivot or "shimmy" on her RLE once standing.  Have discussed using sliding board to get to/from recliner and pt resists this idea as she wants to be able to stand and pivot like she used to do. Again encouraged her to consider SNF for increased time with therapies which will help her progress more quickly to meet her goals. She is more open to the idea of SNF.    Recommendations for follow up therapy are one component of a multi-disciplinary discharge planning process, led by the attending physician.  Recommendations may be updated based on patient status, additional functional criteria and insurance authorization.  Follow Up Recommendations  Skilled nursing-short term rehab (<3 hours/day) Can patient physically be transported by private vehicle: No   Assistance Recommended at Discharge Intermittent Supervision/Assistance  Patient can return home with the following A lot of help with bathing/dressing/bathroom;Assistance with cooking/housework;Assist for  transportation;Two people to help with walking and/or transfers;Help with stairs or ramp for entrance   Equipment Recommendations  Other (comment) (Seems well-equipped; agree with looking into medical [name] transport for HD)    Recommendations for Other Services       Precautions / Restrictions Precautions Precautions: Fall;Other (comment) Precaution Comments: L BKA (baseline) Restrictions Weight Bearing Restrictions: Yes LLE Weight Bearing: Non weight bearing Other Position/Activity Restrictions: L distal femur fx--non union     Mobility  Bed Mobility Overal bed mobility: Needs Assistance Bed Mobility: Supine to Sit, Sit to Supine   Sidelying to sit: HOB elevated, Supervision   Sit to supine: Supervision   General bed mobility comments: incr time and effort, but no physical assist (nearly needed assist each direction)    Transfers Overall transfer level: Needs assistance Equipment used: Rolling walker (2 wheels) Transfers: Sit to/from Stand Sit to Stand: Mod assist, +2 physical assistance, Max assist           General transfer comment: performed 4 stands from EOB with patient tolerating 50-65    Ambulation/Gait             Pre-gait activities: in standing with RW with off-loading by PT/OT on each side, began to work on "shimmy" on RLE for ultimate transfer to chair; pt able to 'shimmy" <2 inches due to rt knee pain     Stairs             Wheelchair Mobility    Modified Rankin (Stroke Patients Only)       Balance Overall balance assessment: Needs assistance Sitting-balance support: Feet supported Sitting balance-Leahy Scale: Good Sitting balance - Comments: ability to transfer weight left and right in sitting  Standing balance support: Bilateral upper extremity supported, During functional activity, Reliant on assistive device for balance Standing balance-Leahy Scale: Poor Standing balance comment: stood for 65 sec                             Cognition Arousal/Alertness: Awake/alert Behavior During Therapy: WFL for tasks assessed/performed Overall Cognitive Status: Within Functional Limits for tasks assessed                                          Exercises Other Exercises Other Exercises: Pt continues to report working on RLE exercises throughout her days. Reports no rt knee pain in open-chain activities, but +with closed-chain    General Comments        Pertinent Vitals/Pain Pain Assessment Pain Assessment: Faces Faces Pain Scale: Hurts even more Pain Location: rt knee Pain Descriptors / Indicators: Discomfort, Aching, Grimacing, Guarding, Moaning Pain Intervention(s): Limited activity within patient's tolerance, Monitored during session, Premedicated before session    Home Living                          Prior Function            PT Goals (current goals can now be found in the care plan section) Acute Rehab PT Goals Patient Stated Goal: less pain with movement Time For Goal Achievement: 10/10/21 Potential to Achieve Goals: Fair Progress towards PT goals: Progressing toward goals    Frequency    Min 2X/week      PT Plan Current plan remains appropriate;Frequency needs to be updated    Co-evaluation PT/OT/SLP Co-Evaluation/Treatment: Yes Reason for Co-Treatment: Complexity of the patient's impairments (multi-system involvement);For patient/therapist safety;To address functional/ADL transfers PT goals addressed during session: Mobility/safety with mobility;Balance;Proper use of DME        AM-PAC PT "6 Clicks" Mobility   Outcome Measure  Help needed turning from your back to your side while in a flat bed without using bedrails?: None Help needed moving from lying on your back to sitting on the side of a flat bed without using bedrails?: A Little Help needed moving to and from a bed to a chair (including a wheelchair)?: Total Help needed standing up from  a chair using your arms (e.g., wheelchair or bedside chair)?: Total Help needed to walk in hospital room?: Total Help needed climbing 3-5 steps with a railing? : Total 6 Click Score: 11    End of Session Equipment Utilized During Treatment: Gait belt Activity Tolerance: Patient limited by pain Patient left: in bed;with call bell/phone within reach Nurse Communication: Mobility status PT Visit Diagnosis: Other abnormalities of gait and mobility (R26.89);Muscle weakness (generalized) (M62.81);History of falling (Z91.81);Pain Pain - Right/Left: Left Pain - part of body: Leg     Time: 0930-1004 PT Time Calculation (min) (ACUTE ONLY): 34 min  Charges:  $Gait Training: 8-22 mins                      [place]  Office [phone]    [name] 10/01/2021, 10:18 AM

## 2021-10-01 NOTE — Progress Notes (Signed)
PROGRESS NOTE    [name]  FFM:[id number] DOB: 11-11-1965 DOA: 08/26/2021 PCP: [name], MD    Brief Narrative:  56 year old woman PMH including ESRD, complicated left hip and femur issues requiring multiple surgeries, removal of hardware presented 6/26 with bloody drainage from surgical incision.  Seen by orthopedics and underwent I&D 6/28 and 7/1.  Currently unable to progress care secondary to refusal to sit in chair for HD.   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  Active Problems:   Closed intertrochanteric fracture of hip, left, sequela   ESRD (end stage renal disease) on dialysis ([place])   Uncontrolled type 2 diabetes mellitus with hypoglycemia, with long-term current use of insulin (HCC)   History of CVA (cerebrovascular accident)   Benign essential HTN   Dyslipidemia, goal LDL below 100   Anxiety and depression   GASTROESOPHAGEAL REFLUX, NO ESOPHAGITIS   TOBACCO DEPENDENCE   Wound infection   Iron deficiency anemia, unspecified   Wound dehiscence, surgical, sequela   Left femoral shaft fracture (HCC)  Subacute osteomyelitis of the left femur with abscess, infected left hip hardware status post removal, left intertrochanteric fracture --Wound VAC removed today per Ortho, continue wound care, cleared by Dr. [name] for discharge; follow-up with orthopedics as an outpatient --per ID daptomycin for 6 weeks with dialysis, stop date 8/12. Follow-up with Dr. Candiss Norse    ESRD --Dialysis MWF.  Patient needs to be able to sit in chair to tolerate outpatient dialysis for discharge --Nephrology managing --s/p fistula revision and branch ligation. -Cont to not tolerate HD. Discussed with Nephrology - recommendations for SNF as pt would not be safe for home d/c (no ramp to get into home) -Cont fluid restriction, concerns were raised regarding compliance to fluid intake -Plan HD tomorrow   Anemia of chronic disease, component of  acute blood loss anemia from wound --Status post 2 unit packed red blood cells 6/29 --Hemoglobin stable    Uncontrolled type 2 diabetes with hypoglycemia --A1c 7.1 --CBG stable. Continue Semglee, NovoLog, sliding scale. CBG stable  Essential hypertension --stable. Continue amlodipine, Coreg, hydralazine   Hyperlipidemia --continue Lipitor    DVT prophylaxis: heparin subq Code Status: Full Family Communication: Pt in room, family not at bedside  Status is: Inpatient Remains inpatient appropriate because: Severity of illness   Consultants:  Nephrology Orthopedic Surgery  Antimicrobials: Anti-infectives (From admission, onward)    Start     Dose/Rate Route Frequency Ordered Stop   10/02/21 2000  DAPTOmycin (CUBICIN) 900 mg in sodium chloride 0.9 % IVPB        10 mg/kg  92.4 kg 136 mL/hr over 30 Minutes Intravenous Once per day on Mon Wed Fri 10/01/21 1350     09/30/21 2000  DAPTOmycin (CUBICIN) 900 mg in sodium chloride 0.9 % IVPB  Status:  Discontinued        10 mg/kg  92.4 kg 136 mL/hr over 30 Minutes Intravenous Daily 09/28/21 0919 10/01/21 1350   09/28/21 2000  DAPTOmycin (CUBICIN) 900 mg in sodium chloride 0.9 % IVPB        900 mg 136 mL/hr over 30 Minutes Intravenous  Once 09/28/21 0919 09/28/21 2146   09/02/21 2000  DAPTOmycin (CUBICIN) 900 mg in sodium chloride 0.9 % IVPB  Status:  Discontinued        10 mg/kg  92.4 kg 136 mL/hr over 30 Minutes Intravenous Once per day on Mon Wed Fri 09/02/21 0836 09/28/21 0919  08/31/21 0745  ceFAZolin (ANCEF) IVPB 2g/100 mL premix        2 g 200 mL/hr over 30 Minutes Intravenous To Short Stay 08/31/21 0730 08/31/21 0918   08/30/21 0600  ceFAZolin (ANCEF) IVPB 2g/100 mL premix  Status:  Discontinued        2 g 200 mL/hr over 30 Minutes Intravenous To Short Stay 08/30/21 0123 08/31/21 0600   08/29/21 2000  DAPTOmycin (CUBICIN) 900 mg in sodium chloride 0.9 % IVPB  Status:  Discontinued        10 mg/kg  92.4 kg 136 mL/hr  over 30 Minutes Intravenous Every 48 hours 08/29/21 1627 09/02/21 0836   08/28/21 1800  ceFAZolin (ANCEF) IVPB 2g/100 mL premix  Status:  Discontinued        2 g 200 mL/hr over 30 Minutes Intravenous Every M-W-F (1800) 08/27/21 0846 08/29/21 1620   08/28/21 1200  ceFAZolin (ANCEF) IVPB 1 g/50 mL premix  Status:  Discontinued        1 g 100 mL/hr over 30 Minutes Intravenous Every M-W-F (Hemodialysis) 08/26/21 1954 08/27/21 0846   08/28/21 0600  ceFAZolin (ANCEF) IVPB 2g/100 mL premix        2 g 200 mL/hr over 30 Minutes Intravenous On call to O.R. 08/27/21 1150 08/28/21 0909   08/27/21 1800  ceFAZolin (ANCEF) IVPB 1 g/50 mL premix        1 g 100 mL/hr over 30 Minutes Intravenous  Once 08/26/21 1954 08/27/21 2048   08/26/21 1500  ceFEPIme (MAXIPIME) 1 g in sodium chloride 0.9 % 100 mL IVPB  Status:  Discontinued        1 g 200 mL/hr over 30 Minutes Intravenous Every 24 hours 08/26/21 1452 08/26/21 1714       Subjective: Reports continued LE swelling today. Claims to be adhering to fluid restriction  Objective: Vitals:   09/30/21 1436 09/30/21 1527 09/30/21 2101 10/01/21 0501  BP: 122/63 (!) 145/58 (!) 129/50 (!) 130/51  Pulse: 80 70 70 63  Resp: 17 18 16 18   Temp: 98.1 F (36.7 C) 98 F (36.7 C) 98.8 F (37.1 C) 98.3 F (36.8 C)  TempSrc:   Oral Oral  SpO2: 99% 100% 97% 98%  Weight: 91.3 kg     Height:        Intake/Output Summary (Last 24 hours) at 10/01/2021 1533 Last data filed at 10/01/2021 0600 Gross per 24 hour  Intake 488 ml  Output 500 ml  Net -12 ml    Filed Weights   09/28/21 0840 09/28/21 1215 09/30/21 1436  Weight: 93.3 kg 89.1 kg 91.3 kg    Examination: General exam: Awake, laying in bed, in nad Respiratory system: Normal respiratory effort, no wheezing Cardiovascular system: regular rate, s1, s2 Gastrointestinal system: Soft, nondistended, positive BS Central nervous system: CN2-12 grossly intact, strength intact Extremities: Perfused, no clubbing,  s/p L AKA Skin: Normal skin turgor, no notable skin lesions seen Psychiatry: Mood normal // no visual hallucinations    Data Reviewed: I have personally reviewed following labs and imaging studies  CBC: Recent Labs  Lab 09/25/21 0717 09/26/21 1639 09/29/21 1317  WBC 7.2 7.3 9.7  HGB 8.9* 9.2* 9.8*  HCT 28.5* 29.0* 30.9*  MCV 94.1 94.5 93.4  PLT 199 213 536    Basic Metabolic Panel: Recent Labs  Lab 09/25/21 0717 09/26/21 1639 09/28/21 0223 09/29/21 1317  NA 129* 129* 135 131*  K 5.1 4.7 4.5 4.4  CL 92* 95* 97* 96*  CO2  27 24 30 28   GLUCOSE 131* 91 105* 140*  BUN 42* 37* 34* 30*  CREATININE 5.36* 4.40* 4.21* 3.91*  CALCIUM 8.7* 8.8* 8.8* 8.7*  PHOS 4.8* 4.7*  --  4.3    GFR: Estimated Creatinine Clearance: 20.1 mL/min (A) (by C-G formula based on SCr of 3.91 mg/dL (H)). Liver Function Tests: Recent Labs  Lab 09/25/21 0717 09/26/21 1639 09/28/21 0223 09/29/21 1317  AST  --   --  18  --   ALT  --   --  9  --   ALKPHOS  --   --  101  --   BILITOT  --   --  0.4  --   PROT  --   --  6.5  --   ALBUMIN 2.7* 2.5* 2.5* 2.7*    No results for input(s): "LIPASE", "AMYLASE" in the last 168 hours. No results for input(s): "AMMONIA" in the last 168 hours. Coagulation Profile: No results for input(s): "INR", "PROTIME" in the last 168 hours. Cardiac Enzymes: Recent Labs  Lab 09/27/21 0446 09/30/21 0258  CKTOTAL 26* 25*    BNP (last 3 results) No results for input(s): "PROBNP" in the last 8760 hours. HbA1C: No results for input(s): "HGBA1C" in the last 72 hours. CBG: Recent Labs  Lab 09/30/21 0714 09/30/21 1648 09/30/21 2058 10/01/21 0725 10/01/21 1127  GLUCAP 102* 141* 218* 141* 137*    Lipid Profile: No results for input(s): "CHOL", "HDL", "LDLCALC", "TRIG", "CHOLHDL", "LDLDIRECT" in the last 72 hours. Thyroid Function Tests: No results for input(s): "TSH", "T4TOTAL", "FREET4", "T3FREE", "THYROIDAB" in the last 72 hours. Anemia Panel: No results  for input(s): "VITAMINB12", "FOLATE", "FERRITIN", "TIBC", "IRON", "RETICCTPCT" in the last 72 hours. Sepsis Labs: No results for input(s): "PROCALCITON", "LATICACIDVEN" in the last 168 hours.  No results found for this or any previous visit (from the past 240 hour(s)).   Radiology Studies: No results found.  Scheduled Meds:  alosetron  1 mg Oral BID   amitriptyline  25 mg Oral QHS   amLODipine  10 mg Oral Q24H   carvedilol  6.25 mg Oral BID   [START ON 10/02/2021] Chlorhexidine Gluconate Cloth  6 each Topical Q0600   cyclobenzaprine  10 mg Oral TID   [START ON 10/05/2021] darbepoetin (ARANESP) injection - DIALYSIS  200 mcg Subcutaneous Q Sat-1800   docusate sodium  100 mg Oral BID   DULoxetine  60 mg Oral Daily   famotidine  20 mg Oral Daily   furosemide  80 mg Oral Daily   heparin  5,000 Units Subcutaneous Q8H   hydrALAZINE  50 mg Oral TID   insulin aspart  0-6 Units Subcutaneous TID WC   insulin aspart  5 Units Subcutaneous TID WC   insulin glargine-yfgn  12 Units Subcutaneous QHS   multivitamin  1 tablet Oral QHS   pantoprazole  80 mg Oral Daily   sevelamer carbonate  1,600 mg Oral TID WC   sodium chloride flush  3 mL Intravenous Q12H   Continuous Infusions:  sodium chloride     sodium chloride 10 mL/hr at 09/19/21 1310   [START ON 10/02/2021] DAPTOmycin (CUBICIN) 900 mg in sodium chloride 0.9 % IVPB     methocarbamol (ROBAXIN) IV       LOS: 36 days   Marylu Lund, MD [facility] Pager On Amion  If 7PM-7AM, please contact night-coverage 10/01/2021, 3:33 PM

## 2021-10-01 NOTE — Progress Notes (Signed)
Sutures harvested today site is clean with serous drainage, abd pads placed with tape.

## 2021-10-01 NOTE — Progress Notes (Addendum)
[facility] Progress Note   Subjective:   Patient seen and examined at bedside.  Able to stand briefly with PT today but did not transfer to chair.  Reports knee pain making it hard to pivot.  Wound vac and sutures removed from hip.  Patient states she is considering SNF placement on d/c.    Objective Vitals:   09/30/21 1436 09/30/21 1527 09/30/21 2101 10/01/21 0501  BP: 122/63 (!) 145/58 (!) 129/50 (!) 130/51  Pulse: 80 70 70 63  Resp: 17 18 16 18   Temp: 98.1 F (36.7 C) 98 F (36.7 C) 98.8 F (37.1 C) 98.3 F (36.8 C)  TempSrc:   Oral Oral  SpO2: 99% 100% 97% 98%  Weight: 91.3 kg     Height:       Physical Exam General:chronically ill appearing female in NAD Heart:RRR, no mrg Lungs:CTAB, nml WOB on RA Abdomen:soft, NTND, +edema in flank Extremities:L BKA, 1+ edema on R Dialysis Access: LU AVF +b/t   Filed Weights   09/28/21 0840 09/28/21 1215 09/30/21 1436  Weight: 93.3 kg 89.1 kg 91.3 kg    Intake/Output Summary (Last 24 hours) at 10/01/2021 1326 Last data filed at 10/01/2021 0600 Gross per 24 hour  Intake 488 ml  Output 504 ml  Net -16 ml    Additional Objective Labs: Basic Metabolic Panel: Recent Labs  Lab 09/25/21 0717 09/26/21 1639 09/28/21 0223 09/29/21 1317  NA 129* 129* 135 131*  K 5.1 4.7 4.5 4.4  CL 92* 95* 97* 96*  CO2 27 24 30 28   GLUCOSE 131* 91 105* 140*  BUN 42* 37* 34* 30*  CREATININE 5.36* 4.40* 4.21* 3.91*  CALCIUM 8.7* 8.8* 8.8* 8.7*  PHOS 4.8* 4.7*  --  4.3   Liver Function Tests: Recent Labs  Lab 09/26/21 1639 09/28/21 0223 09/29/21 1317  AST  --  18  --   ALT  --  9  --   ALKPHOS  --  101  --   BILITOT  --  0.4  --   PROT  --  6.5  --   ALBUMIN 2.5* 2.5* 2.7*   CBC: Recent Labs  Lab 09/25/21 0717 09/26/21 1639 09/29/21 1317  WBC 7.2 7.3 9.7  HGB 8.9* 9.2* 9.8*  HCT 28.5* 29.0* 30.9*  MCV 94.1 94.5 93.4  PLT 199 213 205   Cardiac Enzymes: Recent Labs  Lab 09/27/21 0446 09/30/21 0258  CKTOTAL  26* 25*   CBG: Recent Labs  Lab 09/30/21 0714 09/30/21 1648 09/30/21 2058 10/01/21 0725 10/01/21 1127  GLUCAP 102* 141* 218* 141* 137*   Studies/Results: No results found.  Medications:  sodium chloride     sodium chloride 10 mL/hr at 09/19/21 1310   DAPTOmycin (CUBICIN) 900 mg in sodium chloride 0.9 % IVPB 900 mg (09/30/21 2112)   methocarbamol (ROBAXIN) IV      alosetron  1 mg Oral BID   amitriptyline  25 mg Oral QHS   amLODipine  10 mg Oral Q24H   carvedilol  6.25 mg Oral BID   cyclobenzaprine  10 mg Oral TID   [START ON 10/05/2021] darbepoetin (ARANESP) injection - DIALYSIS  200 mcg Subcutaneous Q Sat-1800   docusate sodium  100 mg Oral BID   DULoxetine  60 mg Oral Daily   famotidine  20 mg Oral Daily   furosemide  80 mg Oral Daily   heparin  5,000 Units Subcutaneous Q8H   hydrALAZINE  50 mg Oral TID   insulin aspart  0-6 Units Subcutaneous TID WC   insulin aspart  5 Units Subcutaneous TID WC   insulin glargine-yfgn  12 Units Subcutaneous QHS   multivitamin  1 tablet Oral QHS   pantoprazole  80 mg Oral Daily   sevelamer carbonate  1,600 mg Oral TID WC   sodium chloride flush  3 mL Intravenous Q12H    Dialysis Orders: [facility] MWF 4h  400/500  90kg  2/2 bath  P2  Heparin 4000 units IV TIW  LUA AVF   Assessment/Plan:    L hip infection: S/p 08/10/21 surgery -> persistent infec VRE Cx > OR 08/28/2021 +08/31/21 for L hip debridement.  Wound vac removed today.  on Daptomycin/" ID recommends continuing Daptomycin 6 weeks on HD stop 10/12/21  ESRD - on HD MWF. Successful cannulation last 2 HD.  Continue to encourage to sit in chair for HD although this is not her biggest obstacle for discharge. Please see # [phone].   AVF Access: Issues with clots, cannulation. To OR 09/19/2021 per Dr. Virl Cagey for branch ligation.    HTN/ volume: BP and volume are improving. Increased amlodipine to 10mg  qd and continue carvedilol 6.25mg  bid. On strict I&Os and getting closer to EDW. Continues to have  large gains, use max UF as tolerated with HD. Remind of importance of following fluid restrictions.  Anemia of ESRD- last HGB 9.8. s/p 2 units pRBC on 6/29. Aranesp 262mcg given 7/21.Transfuse if HGB <7.0.  Secondary HPTH: CCa and PO4 in range. Cont binders.  DM2: Insulin per primary Dispo: Patient has issues with no handicap ramp at her home. OP HD clinic cannot help her get in and out of her car when she arrives to HD. SNF recommended.  Last admission she was supposed to go to SNF but on day of discharge, she refused SNF placement. Very difficult situation.  Not clear what chance of success she will have as OP regardless of her ability to sit in chair if her home situation is not improved.  Have reached out to renal MSW.  Jen Mow, PA-C [facility] 10/01/2021,1:26 PM  LOS: 36 days   Nephrology attending: The patient was seen and examined.  Chart reviewed and I agree with above. ESRD on HD admitted with left hip infection with VRE on daptomycin per ID.  Continue dialysis treatment scheduled.  Difficult discharge condition, social worker is following.  Next dialysis on Wednesday.  Katheran James,  [facility].

## 2021-10-01 NOTE — Plan of Care (Signed)
  Problem: Activity: Goal: Risk for activity intolerance will decrease Outcome: Progressing   Problem: Pain Managment: Goal: General experience of comfort will improve Outcome: Progressing   

## 2021-10-01 NOTE — Progress Notes (Signed)
Patient ID: [name], female   DOB: 22-Aug-1965, 56 y.o.   MRN: [id number] Patient is seen in follow-up status post repeat debridement left hip on 08/31/2021.  Patient initially underwent removal of infected trochanteric nail on 08/10/2021 with follow-up debridement on 08/28/2021.  The wound VAC is removed the incision is well-healed there is a small opening of clear serous lymphatic drainage.  Anticipate this will be a long-term problem.  After discharge we will try to get patient set up for lymphedema pumps.  Will have sutures harvested today.

## 2021-10-02 ENCOUNTER — Telehealth: Payer: Self-pay | Admitting: Vascular Surgery

## 2021-10-02 DIAGNOSIS — M86252 Subacute osteomyelitis, left femur: Secondary | ICD-10-CM | POA: Diagnosis not present

## 2021-10-02 LAB — COMPREHENSIVE METABOLIC PANEL
ALT: 14 U/L (ref 0–44)
AST: 20 U/L (ref 15–41)
Albumin: 2.5 g/dL — ABNORMAL LOW (ref 3.5–5.0)
Alkaline Phosphatase: 112 U/L (ref 38–126)
Anion gap: 9 (ref 5–15)
BUN: 47 mg/dL — ABNORMAL HIGH (ref 6–20)
CO2: 24 mmol/L (ref 22–32)
Calcium: 8.5 mg/dL — ABNORMAL LOW (ref 8.9–10.3)
Chloride: 96 mmol/L — ABNORMAL LOW (ref 98–111)
Creatinine, Ser: 5.33 mg/dL — ABNORMAL HIGH (ref 0.44–1.00)
GFR, Estimated: 9 mL/min — ABNORMAL LOW (ref >60)
Glucose, Bld: 186 mg/dL — ABNORMAL HIGH (ref 70–99)
Potassium: 5.1 mmol/L (ref 3.5–5.1)
Sodium: 129 mmol/L — ABNORMAL LOW (ref 135–145)
Total Bilirubin: 0.5 mg/dL (ref 0.3–1.2)
Total Protein: 6.6 g/dL (ref 6.5–8.1)

## 2021-10-02 LAB — CBC
HCT: 30.9 % — ABNORMAL LOW (ref 36.0–46.0)
HCT: 30.1 % — ABNORMAL LOW (ref 36.0–46.0)
Hemoglobin: 9.6 g/dL — ABNORMAL LOW (ref 12.0–15.0)
Hemoglobin: 9.6 g/dL — ABNORMAL LOW (ref 12.0–15.0)
MCH: 28.7 pg (ref 26.0–34.0)
MCH: 29.7 pg (ref 26.0–34.0)
MCHC: 31.1 g/dL (ref 30.0–36.0)
MCHC: 31.9 g/dL (ref 30.0–36.0)
MCV: 92.2 fL (ref 80.0–100.0)
MCV: 93.2 fL (ref 80.0–100.0)
Platelets: 194 10*3/uL (ref 150–400)
Platelets: 179 10*3/uL (ref 150–400)
RBC: 3.35 MIL/uL — ABNORMAL LOW (ref 3.87–5.11)
RBC: 3.23 MIL/uL — ABNORMAL LOW (ref 3.87–5.11)
RDW: 15 % (ref 11.5–15.5)
RDW: 14.9 % (ref 11.5–15.5)
WBC: 6.8 10*3/uL (ref 4.0–10.5)
WBC: 6.5 10*3/uL (ref 4.0–10.5)
nRBC: 0 % (ref 0.0–0.2)
nRBC: 0 % (ref 0.0–0.2)

## 2021-10-02 LAB — GLUCOSE, CAPILLARY
Glucose-Capillary: 134 mg/dL — ABNORMAL HIGH (ref 70–99)
Glucose-Capillary: 217 mg/dL — ABNORMAL HIGH (ref 70–99)
Glucose-Capillary: 148 mg/dL — ABNORMAL HIGH (ref 70–99)

## 2021-10-02 MED ORDER — PENTAFLUOROPROP-TETRAFLUOROETH EX AERO: 1.0000 | INHALATION_SPRAY | CUTANEOUS | Status: DC | PRN

## 2021-10-02 MED ORDER — HEPARIN SODIUM (PORCINE) 1000 UNIT/ML DIALYSIS: 2500.0000 [IU] | INTRAMUSCULAR | Status: DC | PRN

## 2021-10-02 MED ORDER — HEPARIN SODIUM (PORCINE) 1000 UNIT/ML IJ SOLN
INTRAMUSCULAR | Status: AC
  Administered 2021-10-02: 2500 [IU] via INTRAVENOUS_CENTRAL
  Filled 2021-10-02: qty 3

## 2021-10-02 NOTE — Progress Notes (Signed)
PROGRESS NOTE    [name]  JYN:[id number] DOB: [birth year] DOA: 08/26/2021 PCP: [name], MD   Brief Narrative:  56 year old woman PMH including ESRD, complicated left hip and femur issues requiring multiple surgeries, removal of hardware presented 6/26 with bloody drainage from surgical incision.  Seen by orthopedics and underwent I&D 6/28 and 7/1.  Currently unable to progress care secondary to refusal to sit in chair for HD.    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  Active Problems:   Closed intertrochanteric fracture of hip, left, sequela   ESRD (end stage renal disease) on dialysis ([employer])   Uncontrolled type 2 diabetes mellitus with hypoglycemia, with long-term current use of insulin (HCC)   History of CVA (cerebrovascular accident)   Benign essential HTN   Dyslipidemia, goal LDL below 100   Anxiety and depression   GASTROESOPHAGEAL REFLUX, NO ESOPHAGITIS   TOBACCO DEPENDENCE   Wound infection   Iron deficiency anemia, unspecified   Wound dehiscence, surgical, sequela   Left femoral shaft fracture (HCC)  Assessment and Plan:   Subacute osteomyelitis of the left femur with abscess, infected left hip hardware status post removal, left intertrochanteric fracture --Wound VAC removed today per Ortho, continue wound care, cleared by Dr. [name] for discharge; follow-up with orthopedics as an outpatient --per ID daptomycin for 6 weeks with dialysis, stop date 8/12. Follow-up with Dr. Candiss Norse    ESRD --Dialysis MWF.  Patient needs to be able to sit in chair to tolerate outpatient dialysis for discharge --Nephrology managing --s/p fistula revision and branch ligation. -Cont to not tolerate HD. Discussed with Nephrology - recommendations for SNF as pt would not be safe for home d/c (no ramp to get into home) -Cont fluid restriction, concerns were raised regarding compliance to fluid intake -Plan HD tomorrow   Anemia of  chronic disease, component of acute blood loss anemia from wound --Status post 2 unit packed red blood cells 6/29 --Hemoglobin stable    Uncontrolled type 2 diabetes with hypoglycemia --A1c 7.1 --CBG stable. Continue Semglee, NovoLog, sliding scale. CBG stable  Essential hypertension --stable. Continue amlodipine, Coreg, hydralazine   Hyperlipidemia --continue Lipitor     DVT prophylaxis: heparin subq Code Status: Full Family Communication: Pt in room, family not at bedside   Status is: Inpatient Remains inpatient appropriate because: Severity of illness   Consultants:  Nephrology Orthopedic Surgery   Antimicrobials:  Anti-infectives (From admission, onward)    Start     Dose/Rate Route Frequency Ordered Stop   10/02/21 2000  DAPTOmycin (CUBICIN) 900 mg in sodium chloride 0.9 % IVPB        10 mg/kg  92.4 kg 136 mL/hr over 30 Minutes Intravenous Once per day on Mon Wed Fri 10/01/21 1350     09/30/21 2000  DAPTOmycin (CUBICIN) 900 mg in sodium chloride 0.9 % IVPB  Status:  Discontinued        10 mg/kg  92.4 kg 136 mL/hr over 30 Minutes Intravenous Daily 09/28/21 0919 10/01/21 1350   09/28/21 2000  DAPTOmycin (CUBICIN) 900 mg in sodium chloride 0.9 % IVPB        900 mg 136 mL/hr over 30 Minutes Intravenous  Once 09/28/21 0919 09/28/21 2146   09/02/21 2000  DAPTOmycin (CUBICIN) 900 mg in sodium chloride 0.9 % IVPB  Status:  Discontinued        10 mg/kg  92.4 kg 136 mL/hr over 30 Minutes Intravenous Once per day on  Mon Wed Fri 09/02/21 0836 09/28/21 0919   08/31/21 0745  ceFAZolin (ANCEF) IVPB 2g/100 mL premix        2 g 200 mL/hr over 30 Minutes Intravenous To Short Stay 08/31/21 0730 08/31/21 0918   08/30/21 0600  ceFAZolin (ANCEF) IVPB 2g/100 mL premix  Status:  Discontinued        2 g 200 mL/hr over 30 Minutes Intravenous To Short Stay 08/30/21 0123 08/31/21 0600   08/29/21 2000  DAPTOmycin (CUBICIN) 900 mg in sodium chloride 0.9 % IVPB  Status:  Discontinued         10 mg/kg  92.4 kg 136 mL/hr over 30 Minutes Intravenous Every 48 hours 08/29/21 1627 09/02/21 0836   08/28/21 1800  ceFAZolin (ANCEF) IVPB 2g/100 mL premix  Status:  Discontinued        2 g 200 mL/hr over 30 Minutes Intravenous Every M-W-F (1800) 08/27/21 0846 08/29/21 1620   08/28/21 1200  ceFAZolin (ANCEF) IVPB 1 g/50 mL premix  Status:  Discontinued        1 g 100 mL/hr over 30 Minutes Intravenous Every M-W-F (Hemodialysis) 08/26/21 1954 08/27/21 0846   08/28/21 0600  ceFAZolin (ANCEF) IVPB 2g/100 mL premix        2 g 200 mL/hr over 30 Minutes Intravenous On call to O.R. 08/27/21 1150 08/28/21 0909   08/27/21 1800  ceFAZolin (ANCEF) IVPB 1 g/50 mL premix        1 g 100 mL/hr over 30 Minutes Intravenous  Once 08/26/21 1954 08/27/21 2048   08/26/21 1500  ceFEPIme (MAXIPIME) 1 g in sodium chloride 0.9 % 100 mL IVPB  Status:  Discontinued        1 g 200 mL/hr over 30 Minutes Intravenous Every 24 hours 08/26/21 1452 08/26/21 1714       Subjective: Patient seen and evaluated today with no new acute complaints or concerns. No acute concerns or events noted overnight. Undergoing HD.  Objective: Vitals:   10/02/21 0830 10/02/21 0900 10/02/21 0930 10/02/21 1000  BP: (!) 130/57 (!) 122/56 (!) 142/58 (!) 132/54  Pulse: 62 64 63 64  Resp: 16 20 12 11   Temp:      TempSrc:      SpO2:      Weight:      Height:        Intake/Output Summary (Last 24 hours) at 10/02/2021 1006 Last data filed at 10/02/2021 0800 Gross per 24 hour  Intake 780 ml  Output --  Net 780 ml   Filed Weights   09/28/21 1215 09/30/21 1436 10/02/21 0824  Weight: 89.1 kg 91.3 kg 91.3 kg    Examination:  General exam: Appears calm and comfortable  Respiratory system: Clear to auscultation. Respiratory effort normal. Cardiovascular system: S1 & S2 heard, RRR.  Gastrointestinal system: Abdomen is soft Central nervous system: Alert and awake Extremities: No edema Skin: No significant lesions noted Psychiatry:  Flat affect.    Data Reviewed: I have personally reviewed following labs and imaging studies  CBC: Recent Labs  Lab 09/26/21 1639 09/29/21 1317 10/02/21 0659 10/02/21 0808  WBC 7.3 9.7 6.8 6.5  HGB 9.2* 9.8* 9.6* 9.6*  HCT 29.0* 30.9* 30.9* 30.1*  MCV 94.5 93.4 92.2 93.2  PLT 213 205 194 423   Basic Metabolic Panel: Recent Labs  Lab 09/26/21 1639 09/28/21 0223 09/29/21 1317 10/02/21 0350  NA 129* 135 131* 129*  K 4.7 4.5 4.4 5.1  CL 95* 97* 96* 96*  CO2 24 30  28 24  GLUCOSE 91 105* 140* 186*  BUN 37* 34* 30* 47*  CREATININE 4.40* 4.21* 3.91* 5.33*  CALCIUM 8.8* 8.8* 8.7* 8.5*  PHOS 4.7*  --  4.3  --    GFR: Estimated Creatinine Clearance: 14.8 mL/min (A) (by C-G formula based on SCr of 5.33 mg/dL (H)). Liver Function Tests: Recent Labs  Lab 09/26/21 1639 09/28/21 0223 09/29/21 1317 10/02/21 0350  AST  --  18  --  20  ALT  --  9  --  14  ALKPHOS  --  101  --  112  BILITOT  --  0.4  --  0.5  PROT  --  6.5  --  6.6  ALBUMIN 2.5* 2.5* 2.7* 2.5*   No results for input(s): "LIPASE", "AMYLASE" in the last 168 hours. No results for input(s): "AMMONIA" in the last 168 hours. Coagulation Profile: No results for input(s): "INR", "PROTIME" in the last 168 hours. Cardiac Enzymes: Recent Labs  Lab 09/27/21 0446 09/30/21 0258  CKTOTAL 26* 25*   BNP (last 3 results) No results for input(s): "PROBNP" in the last 8760 hours. HbA1C: No results for input(s): "HGBA1C" in the last 72 hours. CBG: Recent Labs  Lab 10/01/21 0725 10/01/21 1127 10/01/21 1647 10/01/21 2104 10/02/21 0735  GLUCAP 141* 137* 99 159* 134*   Lipid Profile: No results for input(s): "CHOL", "HDL", "LDLCALC", "TRIG", "CHOLHDL", "LDLDIRECT" in the last 72 hours. Thyroid Function Tests: No results for input(s): "TSH", "T4TOTAL", "FREET4", "T3FREE", "THYROIDAB" in the last 72 hours. Anemia Panel: No results for input(s): "VITAMINB12", "FOLATE", "FERRITIN", "TIBC", "IRON", "RETICCTPCT" in the  last 72 hours. Sepsis Labs: No results for input(s): "PROCALCITON", "LATICACIDVEN" in the last 168 hours.  No results found for this or any previous visit (from the past 240 hour(s)).       Radiology Studies: No results found.      Scheduled Meds:  alosetron  1 mg Oral BID   amitriptyline  25 mg Oral QHS   amLODipine  10 mg Oral Q24H   carvedilol  6.25 mg Oral BID   Chlorhexidine Gluconate Cloth  6 each Topical Q0600   cyclobenzaprine  10 mg Oral TID   [START ON 10/05/2021] darbepoetin (ARANESP) injection - DIALYSIS  200 mcg Subcutaneous Q Sat-1800   docusate sodium  100 mg Oral BID   DULoxetine  60 mg Oral Daily   famotidine  20 mg Oral Daily   furosemide  80 mg Oral Daily   heparin  5,000 Units Subcutaneous Q8H   hydrALAZINE  50 mg Oral TID   insulin aspart  0-6 Units Subcutaneous TID WC   insulin aspart  5 Units Subcutaneous TID WC   insulin glargine-yfgn  12 Units Subcutaneous QHS   multivitamin  1 tablet Oral QHS   pantoprazole  80 mg Oral Daily   sevelamer carbonate  1,600 mg Oral TID WC   sodium chloride flush  3 mL Intravenous Q12H   Continuous Infusions:  sodium chloride     sodium chloride 10 mL/hr at 09/19/21 1310   DAPTOmycin (CUBICIN) 900 mg in sodium chloride 0.9 % IVPB     methocarbamol (ROBAXIN) IV       LOS: 37 days    Time spent: 35 minutes    Earnstine Meinders Darleen Crocker, DO [facility]  If 7PM-7AM, please contact night-coverage [email or web address] 10/02/2021, 10:06 AM

## 2021-10-02 NOTE — Plan of Care (Signed)
  Problem: Pain Managment: Goal: General experience of comfort will improve Outcome: Progressing   

## 2021-10-02 NOTE — Procedures (Signed)
Patient was seen on dialysis and the procedure was supervised.  BFR 400  Via AVF BP is  135/64.   Patient appears to be tolerating treatment well.  [name] Tanna Furry 10/02/2021

## 2021-10-02 NOTE — Telephone Encounter (Signed)
-----   Message from [facility], [place] sent at 09/20/2021  3:09 PM EDT ----- S/p L arm fistula revision with branch ligation, 7/20  Follow up in 4 weeks with PA

## 2021-10-02 NOTE — Progress Notes (Addendum)
[facility] Progress Note   Subjective:   Patient seen and examined at bedside in dialysis.  Tolerating treatment well so far.  Still pulling clots and some alarms for high arterial pressure. Denies CP, SOB, abdominal pain and n/v/d.  Continues to have pain in knee with standing.  Encouraged to continue to work with PT.  Objective Vitals:   10/02/21 0930 10/02/21 1000 10/02/21 1030 10/02/21 1100  BP: (!) 142/58 (!) 132/54 (!) 137/55 (!) 120/54  Pulse: 63 64 62 60  Resp: 12 11 10  (!) 8  Temp:      TempSrc:      SpO2:      Weight:      Height:       Physical Exam General:chronically ill appearing female in NAD Heart:RRR, no mrg Lungs:CTAB, nml WOB on RA Abdomen:soft, NTND, +edema in flank Extremities:L BKA, 1+ edema on R Dialysis Access: LU AVF +b/t   Filed Weights   09/28/21 1215 09/30/21 1436 10/02/21 0824  Weight: 89.1 kg 91.3 kg 91.3 kg    Intake/Output Summary (Last 24 hours) at 10/02/2021 1124 Last data filed at 10/02/2021 0800 Gross per 24 hour  Intake 780 ml  Output --  Net 780 ml    Additional Objective Labs: Basic Metabolic Panel: Recent Labs  Lab 09/26/21 1639 09/28/21 0223 09/29/21 1317 10/02/21 0350  NA 129* 135 131* 129*  K 4.7 4.5 4.4 5.1  CL 95* 97* 96* 96*  CO2 24 30 28 24   GLUCOSE 91 105* 140* 186*  BUN 37* 34* 30* 47*  CREATININE 4.40* 4.21* 3.91* 5.33*  CALCIUM 8.8* 8.8* 8.7* 8.5*  PHOS 4.7*  --  4.3  --    Liver Function Tests: Recent Labs  Lab 09/28/21 0223 09/29/21 1317 10/02/21 0350  AST 18  --  20  ALT 9  --  14  ALKPHOS 101  --  112  BILITOT 0.4  --  0.5  PROT 6.5  --  6.6  ALBUMIN 2.5* 2.7* 2.5*   CBC: Recent Labs  Lab 09/26/21 1639 09/29/21 1317 10/02/21 0659 10/02/21 0808  WBC 7.3 9.7 6.8 6.5  HGB 9.2* 9.8* 9.6* 9.6*  HCT 29.0* 30.9* 30.9* 30.1*  MCV 94.5 93.4 92.2 93.2  PLT 213 205 194 179    Cardiac Enzymes: Recent Labs  Lab 09/27/21 0446 09/30/21 0258  CKTOTAL 26* 25*   CBG: Recent  Labs  Lab 10/01/21 0725 10/01/21 1127 10/01/21 1647 10/01/21 2104 10/02/21 0735  GLUCAP 141* 137* 99 159* 134*   Medications:  sodium chloride     sodium chloride 10 mL/hr at 09/19/21 1310   DAPTOmycin (CUBICIN) 900 mg in sodium chloride 0.9 % IVPB     methocarbamol (ROBAXIN) IV      alosetron  1 mg Oral BID   amitriptyline  25 mg Oral QHS   amLODipine  10 mg Oral Q24H   carvedilol  6.25 mg Oral BID   Chlorhexidine Gluconate Cloth  6 each Topical Q0600   cyclobenzaprine  10 mg Oral TID   [START ON 10/05/2021] darbepoetin (ARANESP) injection - DIALYSIS  200 mcg Subcutaneous Q Sat-1800   docusate sodium  100 mg Oral BID   DULoxetine  60 mg Oral Daily   famotidine  20 mg Oral Daily   furosemide  80 mg Oral Daily   heparin  5,000 Units Subcutaneous Q8H   hydrALAZINE  50 mg Oral TID   insulin aspart  0-6 Units Subcutaneous TID WC   insulin aspart  5 Units Subcutaneous TID WC   insulin glargine-yfgn  12 Units Subcutaneous QHS   multivitamin  1 tablet Oral QHS   pantoprazole  80 mg Oral Daily   sevelamer carbonate  1,600 mg Oral TID WC   sodium chloride flush  3 mL Intravenous Q12H    Dialysis Orders: [facility] MWF 4h  400/500  90kg  2/2 bath  P2  Heparin 4000 units IV TIW  LUA AVF   Assessment/Plan:    L hip infection: S/p 08/10/21 surgery -> persistent infec VRE Cx > OR 08/28/2021 +08/31/21 for L hip debridement.  Wound vac removed today.  on Daptomycin/" ID recommends continuing Daptomycin 6 weeks on HD stop 10/12/21  ESRD - on HD MWF. Successful cannulation last 3 HD.  Will increase heparin for next HD. Continue to encourage to sit in chair for HD although this is not her biggest obstacle for discharge. Please see # 8.   AVF Access: Issues with clots, cannulation. To OR 09/19/2021 per Dr. Virl Cagey for branch ligation.    HTN/ volume: BP improved. Continue amlodipine to 10mg  qd and carvedilol 6.25mg  bid. On strict I&Os and getting closer to EDW. Gains improved, reminded of importance of  following fluid restrictions.  Anemia of ESRD- last HGB 9.6. s/p 2 units pRBC on 6/29. Aranesp 221mcg given 7/21.Transfuse if HGB <7.0.  Secondary HPTH: CCa and PO4 in range. Cont binders.  DM2: Insulin per primary Dispo: Patient has issues with no handicap ramp at her home. OP HD clinic cannot help her get in and out of her car when she arrives to HD. SNF recommended.  Last admission she was supposed to go to SNF but on day of discharge, she refused SNF placement. Very difficult situation.  Not clear what chance of success she will have as OP regardless of her ability to sit in chair if her home situation is not improved.  Have reached out to renal MSW.  Jen Mow, PA-C [facility] 10/02/2021,11:24 AM  LOS: 37 days   Nephrology attending: I have personally seen and examined patient.  Chart reviewed and I agree with above. Regular dialysis today and tolerating so far well.  Evaluation for safe discharge planning ongoing.  Continue antibiotics.  Discussed with the primary team. Katheran James, [facility].

## 2021-10-03 DIAGNOSIS — M86252 Subacute osteomyelitis, left femur: Secondary | ICD-10-CM | POA: Diagnosis not present

## 2021-10-03 LAB — GLUCOSE, CAPILLARY
Glucose-Capillary: 115 mg/dL — ABNORMAL HIGH (ref 70–99)
Glucose-Capillary: 162 mg/dL — ABNORMAL HIGH (ref 70–99)
Glucose-Capillary: 131 mg/dL — ABNORMAL HIGH (ref 70–99)
Glucose-Capillary: 135 mg/dL — ABNORMAL HIGH (ref 70–99)

## 2021-10-03 LAB — CK: Total CK: 21 U/L — ABNORMAL LOW (ref 38–234)

## 2021-10-03 MED ORDER — CHLORHEXIDINE GLUCONATE CLOTH 2 % EX PADS
6.0000 | MEDICATED_PAD | Freq: Every day | CUTANEOUS | Status: DC
Start: 2021-10-03 — End: 2021-10-03

## 2021-10-03 NOTE — Progress Notes (Signed)
Occupational Therapy Treatment Patient Details Name: [name] MRN: [id number] DOB: 15-Jun-1965 Today's Date: 10/03/2021   History of present illness 56 y.o. female presented to ED 6/26 from dialysis with increased bloody drainage from L hip wound. 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 s/p hardware removal of left hip with partial resection of tuft tissue and femure that were nonviable on 08/10/21 Discharged home 6/23. underwent L hip debridement and wound vac placement 6/28; +Left intertrochanteric non-union,  Fracture of left distal femur  PMH: hypertension, hyperlipidemia, ESRD on HD MWF, history of left BKA in 2021, depression/anxiety, stroke, tobacco use, T2DM,  insomnia, chronic pain syndrome,   OT comments  Patient on EOB with PT upon arrival. Patient donned shoe on RLE with min assist prior to standing. Patient able to perform 5 stands from EOB with mod to max assist +2 with RW. Patient instructed to attempt to hop back towards bed but was unable to perform. Patient was supervision to return to supine. Patient states she is becoming more acceptable to discharging SNF for further rehab. Acute OT to continue to follow.    Recommendations for follow up therapy are one component of a multi-disciplinary discharge planning process, led by the attending physician.  Recommendations may be updated based on patient status, additional functional criteria and insurance authorization.    Follow Up Recommendations  Skilled nursing-short term rehab (<3 hours/day)    Assistance Recommended at Discharge Intermittent Supervision/Assistance  Patient can return home with the following  A lot of help with bathing/dressing/bathroom;Assistance with cooking/housework;Assist for transportation;Help with stairs or ramp for entrance;Two people to help with walking and/or transfers   Equipment Recommendations  Other (comment) (drop arm commode)    Recommendations for Other  Services      Precautions / Restrictions Precautions Precautions: Fall;Other (comment) Precaution Comments: L BKA (baseline) Restrictions Weight Bearing Restrictions: Yes LLE Weight Bearing: Non weight bearing Other Position/Activity Restrictions: L distal femur fx--non union       Mobility Bed Mobility Overal bed mobility: Needs Assistance Bed Mobility: Supine to Sit, Sit to Supine     Supine to sit: Supervision Sit to supine: Supervision   General bed mobility comments: increased time with patient able to get self up in bed from supine    Transfers Overall transfer level: Needs assistance Equipment used: Rolling walker (2 wheels) Transfers: Sit to/from Stand Sit to Stand: Mod assist, +2 physical assistance, Max assist           General transfer comment: patient performed 5 stands from EOB with pain upon sitting. Patient tolerated 50 - 70 seconds of standing     Balance Overall balance assessment: Needs assistance Sitting-balance support: Feet supported Sitting balance-Leahy Scale: Good     Standing balance support: Bilateral upper extremity supported, During functional activity, Reliant on assistive device for balance Standing balance-Leahy Scale: Poor Standing balance comment: 5 stands performed from 50-70 seconds                           ADL either performed or assessed with clinical judgement   ADL Overall ADL's : Needs assistance/impaired                     Lower Body Dressing: Minimal assistance Lower Body Dressing Details (indicate cue type and reason): donned shoe sitting on EOB  Extremity/Trunk Assessment              Vision       Perception     Praxis      Cognition Arousal/Alertness: Awake/alert Behavior During Therapy: WFL for tasks assessed/performed Overall Cognitive Status: Within Functional Limits for tasks assessed                                 General  Comments: states she is more agreeable to discharge to rehab        Exercises      Shoulder Instructions       General Comments      Pertinent Vitals/ Pain       Pain Assessment Pain Assessment: Faces Faces Pain Scale: Hurts little more Pain Location: rt knee Pain Descriptors / Indicators: Aching, Discomfort, Grimacing, Guarding Pain Intervention(s): Limited activity within patient's tolerance, Monitored during session, Repositioned, Premedicated before session  Home Living                                          Prior Functioning/Environment              Frequency  Min 2X/week        Progress Toward Goals  OT Goals(current goals can now be found in the care plan section)  Progress towards OT goals: Progressing toward goals  Acute Rehab OT Goals Patient Stated Goal: go home OT Goal Formulation: With patient Time For Goal Achievement: 10/08/21 Potential to Achieve Goals: Fair ADL Goals Pt Will Perform Grooming: with modified independence;sitting Pt Will Perform Lower Body Bathing: with min assist;sitting/lateral leans Pt Will Perform Lower Body Dressing: with min assist;sitting/lateral leans Pt Will Transfer to Toilet: bedside commode;with transfer board;with supervision Pt Will Perform Toileting - Clothing Manipulation and hygiene: with min assist;sitting/lateral leans Pt/caregiver will Perform Home Exercise Program: Increased strength;Both right and left upper extremity;With theraband;Independently;With written HEP provided Additional ADL Goal #1: Pt will perform bed mobility modified independently in preparation for ADLs with HOB flat.  Plan Discharge plan remains appropriate;Frequency remains appropriate    Co-evaluation    PT/OT/SLP Co-Evaluation/Treatment: Yes Reason for Co-Treatment: For patient/therapist safety;To address functional/ADL transfers   OT goals addressed during session: ADL's and self-care      AM-PAC OT "6  Clicks" Daily Activity     Outcome Measure   Help from another person eating meals?: None Help from another person taking care of personal grooming?: A Little Help from another person toileting, which includes using toliet, bedpan, or urinal?: Total Help from another person bathing (including washing, rinsing, drying)?: A Lot Help from another person to put on and taking off regular upper body clothing?: A Little Help from another person to put on and taking off regular lower body clothing?: A Lot 6 Click Score: 15    End of Session Equipment Utilized During Treatment: Gait belt;Rolling walker (2 wheels)  OT Visit Diagnosis: Muscle weakness (generalized) (M62.81);Unsteadiness on feet (R26.81);History of falling (Z91.81)   Activity Tolerance Patient tolerated treatment well   Patient Left in bed;with call bell/phone within reach;with bed alarm set   Nurse Communication Mobility status        Time: 1350-1419 OT Time Calculation (min): 29 min  Charges: OT General Charges $OT Visit: 1 Visit OT Treatments $Therapeutic Activity: 8-22 mins  [name],  [facility] Acute Rehabilitation Services  Office [phone]   [name] 10/03/2021, 2:51 PM

## 2021-10-03 NOTE — Progress Notes (Signed)
PROGRESS NOTE    [name]  EHU:[id number] DOB: [birth year] DOA: 08/26/2021 PCP: [name], MD   Brief Narrative:  56 year old woman PMH including ESRD, complicated left hip and femur issues requiring multiple surgeries, removal of hardware presented 6/26 with bloody drainage from surgical incision.  Seen by orthopedics and underwent I&D 6/28 and 7/1.  Currently unable to progress care secondary to refusal to sit in chair for HD.    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  Active Problems:   Closed intertrochanteric fracture of hip, left, sequela   ESRD (end stage renal disease) on dialysis ([place])   Uncontrolled type 2 diabetes mellitus with hypoglycemia, with long-term current use of insulin (HCC)   History of CVA (cerebrovascular accident)   Benign essential HTN   Dyslipidemia, goal LDL below 100   Anxiety and depression   GASTROESOPHAGEAL REFLUX, NO ESOPHAGITIS   TOBACCO DEPENDENCE   Wound infection   Iron deficiency anemia, unspecified   Wound dehiscence, surgical, sequela   Left femoral shaft fracture (HCC)  Assessment and Plan:   Subacute osteomyelitis of the left femur with abscess, infected left hip hardware status post removal, left intertrochanteric fracture --Wound VAC removed today per Ortho, continue wound care, cleared by Dr. [name] for discharge; follow-up with orthopedics as an outpatient --per ID daptomycin for 6 weeks with dialysis, stop date 8/12. Follow-up with Dr. Candiss Norse    ESRD --Dialysis MWF.  Patient needs to be able to sit in chair to tolerate outpatient dialysis for discharge --Nephrology managing --s/p fistula revision and branch ligation. -Cont to not tolerate HD. Discussed with Nephrology - recommendations for SNF as pt would not be safe for home d/c (no ramp to get into home) -Cont fluid restriction, concerns were raised regarding compliance to fluid intake -Plan HD tomorrow   Anemia of  chronic disease, component of acute blood loss anemia from wound --Status post 2 unit packed red blood cells 6/29 --Hemoglobin stable    Uncontrolled type 2 diabetes with hypoglycemia --A1c 7.1 --CBG stable. Continue Semglee, NovoLog, sliding scale. CBG stable  Essential hypertension --stable. Continue amlodipine, Coreg, hydralazine   Hyperlipidemia --continue Lipitor     DVT prophylaxis: heparin subq Code Status: Full Family Communication: Pt in room, family not at bedside   Status is: Inpatient Remains inpatient appropriate because: Severity of illness   Consultants:  Nephrology Orthopedic Surgery   Antimicrobials:  Anti-infectives (From admission, onward)    Start     Dose/Rate Route Frequency Ordered Stop   10/02/21 2000  DAPTOmycin (CUBICIN) 900 mg in sodium chloride 0.9 % IVPB        10 mg/kg  92.4 kg 136 mL/hr over 30 Minutes Intravenous Once per day on Mon Wed Fri 10/01/21 1350     09/30/21 2000  DAPTOmycin (CUBICIN) 900 mg in sodium chloride 0.9 % IVPB  Status:  Discontinued        10 mg/kg  92.4 kg 136 mL/hr over 30 Minutes Intravenous Daily 09/28/21 0919 10/01/21 1350   09/28/21 2000  DAPTOmycin (CUBICIN) 900 mg in sodium chloride 0.9 % IVPB        900 mg 136 mL/hr over 30 Minutes Intravenous  Once 09/28/21 0919 09/28/21 2146   09/02/21 2000  DAPTOmycin (CUBICIN) 900 mg in sodium chloride 0.9 % IVPB  Status:  Discontinued        10 mg/kg  92.4 kg 136 mL/hr over 30 Minutes Intravenous Once per day on  Mon Wed Fri 09/02/21 0836 09/28/21 0919   08/31/21 0745  ceFAZolin (ANCEF) IVPB 2g/100 mL premix        2 g 200 mL/hr over 30 Minutes Intravenous To Short Stay 08/31/21 0730 08/31/21 0918   08/30/21 0600  ceFAZolin (ANCEF) IVPB 2g/100 mL premix  Status:  Discontinued        2 g 200 mL/hr over 30 Minutes Intravenous To Short Stay 08/30/21 0123 08/31/21 0600   08/29/21 2000  DAPTOmycin (CUBICIN) 900 mg in sodium chloride 0.9 % IVPB  Status:  Discontinued         10 mg/kg  92.4 kg 136 mL/hr over 30 Minutes Intravenous Every 48 hours 08/29/21 1627 09/02/21 0836   08/28/21 1800  ceFAZolin (ANCEF) IVPB 2g/100 mL premix  Status:  Discontinued        2 g 200 mL/hr over 30 Minutes Intravenous Every M-W-F (1800) 08/27/21 0846 08/29/21 1620   08/28/21 1200  ceFAZolin (ANCEF) IVPB 1 g/50 mL premix  Status:  Discontinued        1 g 100 mL/hr over 30 Minutes Intravenous Every M-W-F (Hemodialysis) 08/26/21 1954 08/27/21 0846   08/28/21 0600  ceFAZolin (ANCEF) IVPB 2g/100 mL premix        2 g 200 mL/hr over 30 Minutes Intravenous On call to O.R. 08/27/21 1150 08/28/21 0909   08/27/21 1800  ceFAZolin (ANCEF) IVPB 1 g/50 mL premix        1 g 100 mL/hr over 30 Minutes Intravenous  Once 08/26/21 1954 08/27/21 2048   08/26/21 1500  ceFEPIme (MAXIPIME) 1 g in sodium chloride 0.9 % 100 mL IVPB  Status:  Discontinued        1 g 200 mL/hr over 30 Minutes Intravenous Every 24 hours 08/26/21 1452 08/26/21 1714      Subjective: Patient seen and evaluated today with some nausea noted this morning, but otherwise able to tolerate breakfast.  Tolerated dialysis well yesterday.  Objective: Vitals:   10/02/21 1644 10/02/21 2055 10/03/21 0518 10/03/21 0759  BP: (!) 113/49 (!) 117/55 (!) 148/55 137/60  Pulse: 63 69 64 66  Resp: 17 18 18 19   Temp: 98.7 F (37.1 C) 98 F (36.7 C) 98.3 F (36.8 C) 98.5 F (36.9 C)  TempSrc: Oral     SpO2:  99% 99% 100%  Weight:      Height:        Intake/Output Summary (Last 24 hours) at 10/03/2021 0933 Last data filed at 10/03/2021 0600 Gross per 24 hour  Intake 548 ml  Output 5000 ml  Net -4452 ml   Filed Weights   09/30/21 1436 10/02/21 0824 10/02/21 1256  Weight: 91.3 kg 91.3 kg 85.2 kg    Examination:  General exam: Appears calm and comfortable  Respiratory system: Clear to auscultation. Respiratory effort normal. Cardiovascular system: S1 & S2 heard, RRR.  Gastrointestinal system: Abdomen is soft Central nervous  system: Alert and awake Extremities: Left BKA, dressing on left hip Skin: No significant lesions noted Psychiatry: Flat affect.    Data Reviewed: I have personally reviewed following labs and imaging studies  CBC: Recent Labs  Lab 09/26/21 1639 09/29/21 1317 10/02/21 0659 10/02/21 0808  WBC 7.3 9.7 6.8 6.5  HGB 9.2* 9.8* 9.6* 9.6*  HCT 29.0* 30.9* 30.9* 30.1*  MCV 94.5 93.4 92.2 93.2  PLT 213 205 194 619   Basic Metabolic Panel: Recent Labs  Lab 09/26/21 1639 09/28/21 0223 09/29/21 1317 10/02/21 0350  NA 129* 135 131* 129*  K 4.7 4.5 4.4 5.1  CL 95* 97* 96* 96*  CO2 24 30 28 24   GLUCOSE 91 105* 140* 186*  BUN 37* 34* 30* 47*  CREATININE 4.40* 4.21* 3.91* 5.33*  CALCIUM 8.8* 8.8* 8.7* 8.5*  PHOS 4.7*  --  4.3  --    GFR: Estimated Creatinine Clearance: 14.3 mL/min (A) (by C-G formula based on SCr of 5.33 mg/dL (H)). Liver Function Tests: Recent Labs  Lab 09/26/21 1639 09/28/21 0223 09/29/21 1317 10/02/21 0350  AST  --  18  --  20  ALT  --  9  --  14  ALKPHOS  --  101  --  112  BILITOT  --  0.4  --  0.5  PROT  --  6.5  --  6.6  ALBUMIN 2.5* 2.5* 2.7* 2.5*   No results for input(s): "LIPASE", "AMYLASE" in the last 168 hours. No results for input(s): "AMMONIA" in the last 168 hours. Coagulation Profile: No results for input(s): "INR", "PROTIME" in the last 168 hours. Cardiac Enzymes: Recent Labs  Lab 09/27/21 0446 09/30/21 0258 10/03/21 0435  CKTOTAL 26* 25* 21*   BNP (last 3 results) No results for input(s): "PROBNP" in the last 8760 hours. HbA1C: No results for input(s): "HGBA1C" in the last 72 hours. CBG: Recent Labs  Lab 10/01/21 2104 10/02/21 0735 10/02/21 1637 10/02/21 2054 10/03/21 0757  GLUCAP 159* 134* 217* 148* 115*   Lipid Profile: No results for input(s): "CHOL", "HDL", "LDLCALC", "TRIG", "CHOLHDL", "LDLDIRECT" in the last 72 hours. Thyroid Function Tests: No results for input(s): "TSH", "T4TOTAL", "FREET4", "T3FREE",  "THYROIDAB" in the last 72 hours. Anemia Panel: No results for input(s): "VITAMINB12", "FOLATE", "FERRITIN", "TIBC", "IRON", "RETICCTPCT" in the last 72 hours. Sepsis Labs: No results for input(s): "PROCALCITON", "LATICACIDVEN" in the last 168 hours.  No results found for this or any previous visit (from the past 240 hour(s)).       Radiology Studies: No results found.      Scheduled Meds:  alosetron  1 mg Oral BID   amitriptyline  25 mg Oral QHS   amLODipine  10 mg Oral Q24H   carvedilol  6.25 mg Oral BID   Chlorhexidine Gluconate Cloth  6 each Topical Q0600   cyclobenzaprine  10 mg Oral TID   [START ON 10/05/2021] darbepoetin (ARANESP) injection - DIALYSIS  200 mcg Subcutaneous Q Sat-1800   docusate sodium  100 mg Oral BID   DULoxetine  60 mg Oral Daily   famotidine  20 mg Oral Daily   furosemide  80 mg Oral Daily   heparin  5,000 Units Subcutaneous Q8H   hydrALAZINE  50 mg Oral TID   insulin aspart  0-6 Units Subcutaneous TID WC   insulin aspart  5 Units Subcutaneous TID WC   insulin glargine-yfgn  12 Units Subcutaneous QHS   multivitamin  1 tablet Oral QHS   pantoprazole  80 mg Oral Daily   sevelamer carbonate  1,600 mg Oral TID WC   sodium chloride flush  3 mL Intravenous Q12H   Continuous Infusions:  sodium chloride     sodium chloride 10 mL/hr at 09/19/21 1310   DAPTOmycin (CUBICIN) 900 mg in sodium chloride 0.9 % IVPB 900 mg (10/02/21 2005)   methocarbamol (ROBAXIN) IV       LOS: 38 days    Time spent: 35 minutes    Nalea Salce Darleen Crocker, DO [facility]  If 7PM-7AM, please contact night-coverage [email or web address] 10/03/2021, 9:33 AM

## 2021-10-03 NOTE — Progress Notes (Signed)
Physical Therapy Treatment Patient Details Name: [name] MRN: [id number] DOB: 04/18/1965 Today's Date: 10/03/2021   History of Present Illness 56 y.o. female presented to ED 6/26 from dialysis with increased bloody drainage from L hip wound. 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 s/p hardware removal of left hip with partial resection of tuft tissue and femure that were nonviable on 08/10/21 Discharged home 6/23. underwent L hip debridement and wound vac placement 6/28; +Left intertrochanteric non-union,  Fracture of left distal femur  PMH: hypertension, hyperlipidemia, ESRD on HD MWF, history of left BKA in 2021, depression/anxiety, stroke, tobacco use, T2DM,  insomnia, chronic pain syndrome,    PT Comments    Patient utilized shoe for sit to stands today. Unfortunately it has such a good non-slip sole that she was not able to advance her RLE in any way while standing. She did increase her standing times to 50-70 seconds each time and performed 5 reps of sit to stand with +2 assist. Patient reports she has now agreed to SNF for rehab. Will continue to progress as pt can tolerate.     Recommendations for follow up therapy are one component of a multi-disciplinary discharge planning process, led by the attending physician.  Recommendations may be updated based on patient status, additional functional criteria and insurance authorization.  Follow Up Recommendations  Skilled nursing-short term rehab (<3 hours/day) Can patient physically be transported by private vehicle: No   Assistance Recommended at Discharge Intermittent Supervision/Assistance  Patient can return home with the following A lot of help with bathing/dressing/bathroom;Assistance with cooking/housework;Assist for transportation;Two people to help with walking and/or transfers;Help with stairs or ramp for entrance   Equipment Recommendations  Other (comment) (Seems well-equipped; agree with  looking into medical [name] transport for HD)    Recommendations for Other Services       Precautions / Restrictions Precautions Precautions: Fall;Other (comment) Precaution Comments: L BKA (baseline) Restrictions Weight Bearing Restrictions: Yes LLE Weight Bearing: Non weight bearing Other Position/Activity Restrictions: L distal femur fx--non union     Mobility  Bed Mobility Overal bed mobility: Needs Assistance Bed Mobility: Supine to Sit, Sit to Supine     Supine to sit: Supervision Sit to supine: Supervision   General bed mobility comments: increased time with patient able to get self up in bed from supine    Transfers Overall transfer level: Needs assistance Equipment used: Rolling walker (2 wheels) Transfers: Sit to/from Stand Sit to Stand: Mod assist, +2 physical assistance, Max assist           General transfer comment: patient performed 5 stands from EOB with pain upon sitting. Patient tolerated 50 - 70 seconds of standing    Ambulation/Gait             Pre-gait activities: attempted to have pt step/hop backwards toward bed and pt unable to unweight RLE enough to move her foot even the slightest amount     Stairs             Wheelchair Mobility    Modified Rankin (Stroke Patients Only)       Balance Overall balance assessment: Needs assistance Sitting-balance support: Feet supported Sitting balance-Leahy Scale: Good     Standing balance support: Bilateral upper extremity supported, During functional activity, Reliant on assistive device for balance Standing balance-Leahy Scale: Poor Standing balance comment: 5 stands performed from 50-70 seconds  Cognition Arousal/Alertness: Awake/alert Behavior During Therapy: WFL for tasks assessed/performed Overall Cognitive Status: Within Functional Limits for tasks assessed                                 General Comments: states she is  more agreeable to discharge to rehab        Exercises General Exercises - Lower Extremity Long Arc Quad: Right, Seated, 20 reps Heel Slides: AROM, Right, 5 reps Hip ABduction/ADduction: AROM, Right, 5 reps Straight Leg Raises: AROM, Right, 5 reps Other Exercises Other Exercises: LLE internal rotation in supine and sitting (cannot get to neutral) Other Exercises: limited # reps as pt demonstrating the exercises she has been working on by herself; also includes RLE bridging    General Comments        Pertinent Vitals/Pain Pain Assessment Pain Assessment: Faces Faces Pain Scale: Hurts little more Pain Location: rt knee Pain Descriptors / Indicators: Aching, Discomfort, Grimacing, Guarding Pain Intervention(s): Limited activity within patient's tolerance, Monitored during session, Premedicated before session    Home Living                          Prior Function            PT Goals (current goals can now be found in the care plan section) Acute Rehab PT Goals Patient Stated Goal: less pain with movement Time For Goal Achievement: 10/10/21 Potential to Achieve Goals: Fair Progress towards PT goals: Progressing toward goals    Frequency    Min 2X/week      PT Plan Current plan remains appropriate;Frequency needs to be updated    Co-evaluation PT/OT/SLP Co-Evaluation/Treatment: Yes Reason for Co-Treatment: Necessary to address cognition/behavior during functional activity;To address functional/ADL transfers;For patient/therapist safety PT goals addressed during session: Mobility/safety with mobility;Balance;Proper use of DME;Strengthening/ROM OT goals addressed during session: ADL's and self-care      AM-PAC PT "6 Clicks" Mobility   Outcome Measure  Help needed turning from your back to your side while in a flat bed without using bedrails?: None Help needed moving from lying on your back to sitting on the side of a flat bed without using bedrails?: A  Little Help needed moving to and from a bed to a chair (including a wheelchair)?: Total Help needed standing up from a chair using your arms (e.g., wheelchair or bedside chair)?: Total Help needed to walk in hospital room?: Total Help needed climbing 3-5 steps with a railing? : Total 6 Click Score: 11    End of Session Equipment Utilized During Treatment: Gait belt Activity Tolerance: Patient limited by pain Patient left: in bed;with call bell/phone within reach   PT Visit Diagnosis: Other abnormalities of gait and mobility (R26.89);Muscle weakness (generalized) (M62.81);History of falling (Z91.81);Pain Pain - Right/Left: Left Pain - part of body: Leg     Time: 1341-1419 PT Time Calculation (min) (ACUTE ONLY): 38 min  Charges:  $Gait Training: 8-22 mins                      Arby Barrette, PT [place]  Office [phone]    [name] 10/03/2021, 3:10 PM

## 2021-10-03 NOTE — Progress Notes (Addendum)
[facility] Progress Note   Subjective:   Patient seen and examined at bedside.  Reports nausea this AM but able to tolerate breakfast.  No abdominal pain, vomiting or diarrhea.  Tolerated dialysis well yesterday. Edema improving. Denies CP and SOB.   Objective Vitals:   10/02/21 1644 10/02/21 2055 10/03/21 0518 10/03/21 0759  BP: (!) 113/49 (!) 117/55 (!) 148/55 137/60  Pulse: 63 69 64 66  Resp: 17 18 18 19   Temp: 98.7 F (37.1 C) 98 F (36.7 C) 98.3 F (36.8 C) 98.5 F (36.9 C)  TempSrc: Oral     SpO2:  99% 99% 100%  Weight:      Height:       Physical Exam General:chronically ill appearing female in NAD Heart:RRR, no mrg Lungs:CTAB, nml WOB on RA Abdomen:+flank edema, R>L Extremities:L BKA, no RLE edema, dressing on L hip Dialysis Access: LU AVF +b/t   Filed Weights   09/30/21 1436 10/02/21 0824 10/02/21 1256  Weight: 91.3 kg 91.3 kg 85.2 kg    Intake/Output Summary (Last 24 hours) at 10/03/2021 0901 Last data filed at 10/03/2021 0600 Gross per 24 hour  Intake 548 ml  Output 5000 ml  Net -4452 ml    Additional Objective Labs: Basic Metabolic Panel: Recent Labs  Lab 09/26/21 1639 09/28/21 0223 09/29/21 1317 10/02/21 0350  NA 129* 135 131* 129*  K 4.7 4.5 4.4 5.1  CL 95* 97* 96* 96*  CO2 24 30 28 24   GLUCOSE 91 105* 140* 186*  BUN 37* 34* 30* 47*  CREATININE 4.40* 4.21* 3.91* 5.33*  CALCIUM 8.8* 8.8* 8.7* 8.5*  PHOS 4.7*  --  4.3  --    Liver Function Tests: Recent Labs  Lab 09/28/21 0223 09/29/21 1317 10/02/21 0350  AST 18  --  20  ALT 9  --  14  ALKPHOS 101  --  112  BILITOT 0.4  --  0.5  PROT 6.5  --  6.6  ALBUMIN 2.5* 2.7* 2.5*   CBC: Recent Labs  Lab 09/26/21 1639 09/29/21 1317 10/02/21 0659 10/02/21 0808  WBC 7.3 9.7 6.8 6.5  HGB 9.2* 9.8* 9.6* 9.6*  HCT 29.0* 30.9* 30.9* 30.1*  MCV 94.5 93.4 92.2 93.2  PLT 213 205 194 179     Cardiac Enzymes: Recent Labs  Lab 09/27/21 0446 09/30/21 0258 10/03/21 0435   CKTOTAL 26* 25* 21*   CBG: Recent Labs  Lab 10/01/21 2104 10/02/21 0735 10/02/21 1637 10/02/21 2054 10/03/21 0757  GLUCAP 159* 134* 217* 148* 115*   Medications:  sodium chloride     sodium chloride 10 mL/hr at 09/19/21 1310   DAPTOmycin (CUBICIN) 900 mg in sodium chloride 0.9 % IVPB 900 mg (10/02/21 2005)   methocarbamol (ROBAXIN) IV      alosetron  1 mg Oral BID   amitriptyline  25 mg Oral QHS   amLODipine  10 mg Oral Q24H   carvedilol  6.25 mg Oral BID   cyclobenzaprine  10 mg Oral TID   [START ON 10/05/2021] darbepoetin (ARANESP) injection - DIALYSIS  200 mcg Subcutaneous Q Sat-1800   docusate sodium  100 mg Oral BID   DULoxetine  60 mg Oral Daily   famotidine  20 mg Oral Daily   furosemide  80 mg Oral Daily   heparin  5,000 Units Subcutaneous Q8H   hydrALAZINE  50 mg Oral TID   insulin aspart  0-6 Units Subcutaneous TID WC   insulin aspart  5 Units Subcutaneous TID WC  insulin glargine-yfgn  12 Units Subcutaneous QHS   multivitamin  1 tablet Oral QHS   pantoprazole  80 mg Oral Daily   sevelamer carbonate  1,600 mg Oral TID WC   sodium chloride flush  3 mL Intravenous Q12H    Dialysis Orders: [facility] MWF 4h  400/500  90kg  2/2 bath  P2  Heparin 4000 units IV TIW  LUA AVF   Assessment/Plan:    L hip infection: S/p 08/10/21 surgery -> persistent infec VRE Cx > OR 08/28/2021 +08/31/21 for L hip debridement.  Wound vac removed earlier this week.  on Daptomycin/" ID recommends continuing Daptomycin 6 weeks on HD stop 10/12/21  ESRD - on HD MWF. Successful cannulation last 4 HD.  Will increase heparin for next HD. Continue to encourage to sit in chair for HD although this is not her biggest obstacle for discharge. Please see # 8.   AVF Access: Issues with clots, cannulation. To OR 09/19/2021 per Dr. Virl Cagey for branch ligation.    HTN/ volume: BP improved. Continue amlodipine to 10mg  qd and carvedilol 6.25mg  bid. On strict I&Os and now under EDW. Gains improved, reminded of  importance of following fluid restrictions.  Anemia of ESRD- last HGB 9.6. s/p 2 units pRBC on 6/29. Aranesp 231mcg qwk.Transfuse if HGB <7.0.  Secondary HPTH: CCa and PO4 in range. Cont binders.  DM2: Insulin per primary Dispo: Patient has issues with no handicap ramp at her home. OP HD clinic cannot help her get in and out of her car when she arrives to HD. SNF recommended.  Last admission she was supposed to go to SNF but on day of discharge, she refused SNF placement. Very difficult situation.  Not clear what chance of success she will have as OP regardless of her ability to sit in chair if her home situation is not improved.  Have reached out to renal MSW.  Jen Mow, PA-C [facility] 10/03/2021,9:01 AM  LOS: 38 days   Nephrology attending: I have personally seen and examined the patient.  Chart reviewed.  I agree with above. Tolerated 5 L ultrafiltration with dialysis yesterday.  Regular HD tomorrow.  On antibiotics.  No clinical change.  Discussed with the primary team.  D.  Carolin Sicks, CKA

## 2021-10-03 NOTE — Plan of Care (Signed)
  Problem: Activity: Goal: Risk for activity intolerance will decrease Outcome: Progressing   Problem: Pain Managment: Goal: General experience of comfort will improve Outcome: Progressing   

## 2021-10-04 DIAGNOSIS — M86252 Subacute osteomyelitis, left femur: Secondary | ICD-10-CM | POA: Diagnosis not present

## 2021-10-04 LAB — RENAL FUNCTION PANEL
Albumin: 2.5 g/dL — ABNORMAL LOW (ref 3.5–5.0)
Anion gap: 9 (ref 5–15)
BUN: 48 mg/dL — ABNORMAL HIGH (ref 6–20)
CO2: 27 mmol/L (ref 22–32)
Calcium: 8.9 mg/dL (ref 8.9–10.3)
Chloride: 94 mmol/L — ABNORMAL LOW (ref 98–111)
Creatinine, Ser: 5.72 mg/dL — ABNORMAL HIGH (ref 0.44–1.00)
GFR, Estimated: 8 mL/min — ABNORMAL LOW (ref >60)
Glucose, Bld: 162 mg/dL — ABNORMAL HIGH (ref 70–99)
Phosphorus: 4.8 mg/dL — ABNORMAL HIGH (ref 2.5–4.6)
Potassium: 4.5 mmol/L (ref 3.5–5.1)
Sodium: 130 mmol/L — ABNORMAL LOW (ref 135–145)

## 2021-10-04 LAB — CBC
HCT: 31.5 % — ABNORMAL LOW (ref 36.0–46.0)
Hemoglobin: 9.8 g/dL — ABNORMAL LOW (ref 12.0–15.0)
MCH: 28.7 pg (ref 26.0–34.0)
MCHC: 31.1 g/dL (ref 30.0–36.0)
MCV: 92.4 fL (ref 80.0–100.0)
Platelets: 272 10*3/uL (ref 150–400)
RBC: 3.41 MIL/uL — ABNORMAL LOW (ref 3.87–5.11)
RDW: 15.1 % (ref 11.5–15.5)
WBC: 6.8 10*3/uL (ref 4.0–10.5)
nRBC: 0 % (ref 0.0–0.2)

## 2021-10-04 LAB — GLUCOSE, CAPILLARY
Glucose-Capillary: 139 mg/dL — ABNORMAL HIGH (ref 70–99)
Glucose-Capillary: 200 mg/dL — ABNORMAL HIGH (ref 70–99)
Glucose-Capillary: 246 mg/dL — ABNORMAL HIGH (ref 70–99)
Glucose-Capillary: 199 mg/dL — ABNORMAL HIGH (ref 70–99)

## 2021-10-04 MED ORDER — PENTAFLUOROPROP-TETRAFLUOROETH EX AERO: 1.0000 | INHALATION_SPRAY | CUTANEOUS | Status: DC | PRN

## 2021-10-04 MED ORDER — ANTICOAGULANT SODIUM CITRATE 4% (200MG/5ML) IV SOLN
5.0000 mL | Status: DC | PRN
  Filled 2021-10-04: qty 5

## 2021-10-04 MED ORDER — HEPARIN SODIUM (PORCINE) 1000 UNIT/ML DIALYSIS
4000.0000 [IU] | INTRAMUSCULAR | Status: DC | PRN
  Filled 2021-10-04: qty 4

## 2021-10-04 MED ORDER — DARBEPOETIN ALFA 200 MCG/0.4ML IJ SOSY
200.0000 ug | PREFILLED_SYRINGE | INTRAMUSCULAR | Status: DC
Start: 2021-10-04 — End: 2021-10-11
  Administered 2021-10-04: 200 ug via SUBCUTANEOUS
  Filled 2021-10-04 (×2): qty 0.4

## 2021-10-04 MED ORDER — ALTEPLASE 2 MG IJ SOLR: 2.0000 mg | Freq: Once | INTRAMUSCULAR | Status: DC | PRN

## 2021-10-04 MED ORDER — ATORVASTATIN CALCIUM 40 MG PO TABS
40.0000 mg | ORAL_TABLET | Freq: Every day | ORAL | Status: DC
  Administered 2021-10-04 – 2022-03-17 (×164): 40 mg via ORAL
  Filled 2021-10-04 (×164): qty 1

## 2021-10-04 MED ORDER — DARBEPOETIN ALFA 200 MCG/0.4ML IJ SOSY
PREFILLED_SYRINGE | INTRAMUSCULAR | Status: AC
  Filled 2021-10-04: qty 0.4

## 2021-10-04 MED ORDER — HEPARIN SODIUM (PORCINE) 1000 UNIT/ML DIALYSIS: 1000.0000 [IU] | INTRAMUSCULAR | Status: DC | PRN

## 2021-10-04 MED ORDER — AMLODIPINE BESYLATE 5 MG PO TABS
5.0000 mg | ORAL_TABLET | Freq: Every day | ORAL | Status: DC
  Administered 2021-10-04: 5 mg via ORAL
  Filled 2021-10-04: qty 1

## 2021-10-04 NOTE — Progress Notes (Signed)
Pt off unit to hemo

## 2021-10-04 NOTE — Progress Notes (Signed)
PROGRESS NOTE    [name]  ZSW:[id number] DOB: 12/14/1965 DOA: 08/26/2021 PCP: [name], MD   Brief Narrative:  56 year old woman PMH including ESRD, complicated left hip and femur issues requiring multiple surgeries, removal of hardware presented 6/26 with bloody drainage from surgical incision.  Seen by orthopedics and underwent I&D 6/28 and 7/1.  Currently unable to progress care secondary to refusal to sit in chair for HD.    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  Active Problems:   Closed intertrochanteric fracture of hip, left, sequela   ESRD (end stage renal disease) on dialysis ([place])   Uncontrolled type 2 diabetes mellitus with hypoglycemia, with long-term current use of insulin (HCC)   History of CVA (cerebrovascular accident)   Benign essential HTN   Dyslipidemia, goal LDL below 100   Anxiety and depression   GASTROESOPHAGEAL REFLUX, NO ESOPHAGITIS   TOBACCO DEPENDENCE   Wound infection   Iron deficiency anemia, unspecified   Wound dehiscence, surgical, sequela   Left femoral shaft fracture (HCC)  Assessment and Plan:   Subacute osteomyelitis of the left femur with abscess, infected left hip hardware status post removal, left intertrochanteric fracture --Wound VAC removed today per Ortho, continue wound care, cleared by Dr. [name] for discharge; follow-up with orthopedics as an outpatient --per ID daptomycin for 6 weeks with dialysis, stop date 8/12. Follow-up with Dr. Candiss Norse    ESRD --Dialysis MWF.  Patient needs to be able to sit in chair to tolerate outpatient dialysis for discharge --Nephrology managing --s/p fistula revision and branch ligation. -Cont to not tolerate HD. Discussed with Nephrology - recommendations for SNF as pt would not be safe for home d/c (no ramp to get into home) -Cont fluid restriction, concerns were raised regarding compliance to fluid intake -HD today; still not able to get  into chair due to pain   Anemia of chronic disease, component of acute blood loss anemia from wound --Status post 2 unit packed red blood cells 6/29 --Hemoglobin stable    Uncontrolled type 2 diabetes with hypoglycemia --A1c 7.1 --CBG stable. Continue Semglee, NovoLog, sliding scale. CBG stable  Essential hypertension --stable. Continue amlodipine, Coreg, hydralazine   Hyperlipidemia --continue Lipitor     DVT prophylaxis: heparin subq Code Status: Full Family Communication: None at bedside   Status is: Inpatient Remains inpatient appropriate because: Severity of illness   Consultants:  Nephrology Orthopedic Surgery    Antimicrobials:  Anti-infectives (From admission, onward)    Start     Dose/Rate Route Frequency Ordered Stop   10/02/21 2000  DAPTOmycin (CUBICIN) 900 mg in sodium chloride 0.9 % IVPB        10 mg/kg  92.4 kg 136 mL/hr over 30 Minutes Intravenous Once per day on Mon Wed Fri 10/01/21 1350     09/30/21 2000  DAPTOmycin (CUBICIN) 900 mg in sodium chloride 0.9 % IVPB  Status:  Discontinued        10 mg/kg  92.4 kg 136 mL/hr over 30 Minutes Intravenous Daily 09/28/21 0919 10/01/21 1350   09/28/21 2000  DAPTOmycin (CUBICIN) 900 mg in sodium chloride 0.9 % IVPB        900 mg 136 mL/hr over 30 Minutes Intravenous  Once 09/28/21 0919 09/28/21 2146   09/02/21 2000  DAPTOmycin (CUBICIN) 900 mg in sodium chloride 0.9 % IVPB  Status:  Discontinued        10 mg/kg  92.4 kg 136 mL/hr over 30  Minutes Intravenous Once per day on Mon Wed Fri 09/02/21 0836 09/28/21 0919   08/31/21 0745  ceFAZolin (ANCEF) IVPB 2g/100 mL premix        2 g 200 mL/hr over 30 Minutes Intravenous To Short Stay 08/31/21 0730 08/31/21 0918   08/30/21 0600  ceFAZolin (ANCEF) IVPB 2g/100 mL premix  Status:  Discontinued        2 g 200 mL/hr over 30 Minutes Intravenous To Short Stay 08/30/21 0123 08/31/21 0600   08/29/21 2000  DAPTOmycin (CUBICIN) 900 mg in sodium chloride 0.9 % IVPB  Status:   Discontinued        10 mg/kg  92.4 kg 136 mL/hr over 30 Minutes Intravenous Every 48 hours 08/29/21 1627 09/02/21 0836   08/28/21 1800  ceFAZolin (ANCEF) IVPB 2g/100 mL premix  Status:  Discontinued        2 g 200 mL/hr over 30 Minutes Intravenous Every M-W-F (1800) 08/27/21 0846 08/29/21 1620   08/28/21 1200  ceFAZolin (ANCEF) IVPB 1 g/50 mL premix  Status:  Discontinued        1 g 100 mL/hr over 30 Minutes Intravenous Every M-W-F (Hemodialysis) 08/26/21 1954 08/27/21 0846   08/28/21 0600  ceFAZolin (ANCEF) IVPB 2g/100 mL premix        2 g 200 mL/hr over 30 Minutes Intravenous On call to O.R. 08/27/21 1150 08/28/21 0909   08/27/21 1800  ceFAZolin (ANCEF) IVPB 1 g/50 mL premix        1 g 100 mL/hr over 30 Minutes Intravenous  Once 08/26/21 1954 08/27/21 2048   08/26/21 1500  ceFEPIme (MAXIPIME) 1 g in sodium chloride 0.9 % 100 mL IVPB  Status:  Discontinued        1 g 200 mL/hr over 30 Minutes Intravenous Every 24 hours 08/26/21 1452 08/26/21 1714       Subjective: Patient seen and evaluated today with no new acute complaints or concerns. No acute concerns or events noted overnight.  Objective: Vitals:   10/04/21 1100 10/04/21 1130 10/04/21 1200 10/04/21 1230  BP: 128/60 (!) 126/55 (!) 125/54 (!) 114/58  Pulse: 63 62 64 64  Resp: 17 11 (!) 9 12  Temp:    97.8 F (36.6 C)  TempSrc:    Oral  SpO2: 100% 99% 100% 97%  Weight:      Height:        Intake/Output Summary (Last 24 hours) at 10/04/2021 1325 Last data filed at 10/04/2021 1230 Gross per 24 hour  Intake 420 ml  Output 4501 ml  Net -4081 ml   Filed Weights   10/02/21 1256 10/03/21 1957 10/04/21 0828  Weight: 85.2 kg 89.8 kg 89.1 kg    Examination:  General exam: Appears calm and comfortable  Respiratory system: Clear to auscultation. Respiratory effort normal. Cardiovascular system: S1 & S2 heard, RRR.  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    Data Reviewed: I have personally reviewed following labs and imaging studies  CBC: Recent Labs  Lab 09/29/21 1317 10/02/21 0659 10/02/21 0808 10/04/21 0820  WBC 9.7 6.8 6.5 6.8  HGB 9.8* 9.6* 9.6* 9.8*  HCT 30.9* 30.9* 30.1* 31.5*  MCV 93.4 92.2 93.2 92.4  PLT 205 194 179 315   Basic Metabolic Panel: Recent Labs  Lab 09/28/21 0223 09/29/21 1317 10/02/21 0350 10/04/21 0820  NA 135 131* 129* 130*  K  4.5 4.4 5.1 4.5  CL 97* 96* 96* 94*  CO2 30 28 24 27   GLUCOSE 105* 140* 186* 162*  BUN 34* 30* 47* 48*  CREATININE 4.21* 3.91* 5.33* 5.72*  CALCIUM 8.8* 8.7* 8.5* 8.9  PHOS  --  4.3  --  4.8*   GFR: Estimated Creatinine Clearance: 13.6 mL/min (A) (by C-G formula based on SCr of 5.72 mg/dL (H)). Liver Function Tests: Recent Labs  Lab 09/28/21 0223 09/29/21 1317 10/02/21 0350 10/04/21 0820  AST 18  --  20  --   ALT 9  --  14  --   ALKPHOS 101  --  112  --   BILITOT 0.4  --  0.5  --   PROT 6.5  --  6.6  --   ALBUMIN 2.5* 2.7* 2.5* 2.5*   No results for input(s): "LIPASE", "AMYLASE" in the last 168 hours. No results for input(s): "AMMONIA" in the last 168 hours. Coagulation Profile: No results for input(s): "INR", "PROTIME" in the last 168 hours. Cardiac Enzymes: Recent Labs  Lab 09/30/21 0258 10/03/21 0435  CKTOTAL 25* 21*   BNP (last 3 results) No results for input(s): "PROBNP" in the last 8760 hours. HbA1C: No results for input(s): "HGBA1C" in the last 72 hours. CBG: Recent Labs  Lab 10/03/21 0757 10/03/21 1134 10/03/21 1631 10/03/21 2000 10/04/21 0725  GLUCAP 115* 162* 131* 135* 139*   Lipid Profile: No results for input(s): "CHOL", "HDL", "LDLCALC", "TRIG", "CHOLHDL", "LDLDIRECT" in the last 72 hours. Thyroid Function Tests: No results for input(s): "TSH", "T4TOTAL", "FREET4", "T3FREE", "THYROIDAB" in the last 72 hours. Anemia Panel: No results for  input(s): "VITAMINB12", "FOLATE", "FERRITIN", "TIBC", "IRON", "RETICCTPCT" in the last 72 hours. Sepsis Labs: No results for input(s): "PROCALCITON", "LATICACIDVEN" in the last 168 hours.  No results found for this or any previous visit (from the past 240 hour(s)).       Radiology Studies: No results found.      Scheduled Meds:  alosetron  1 mg Oral BID   amitriptyline  25 mg Oral QHS   amLODipine  10 mg Oral Q24H   carvedilol  6.25 mg Oral BID   cyclobenzaprine  10 mg Oral TID   Darbepoetin Alfa       darbepoetin (ARANESP) injection - DIALYSIS  200 mcg Subcutaneous Q Fri-HD   docusate sodium  100 mg Oral BID   DULoxetine  60 mg Oral Daily   famotidine  20 mg Oral Daily   furosemide  80 mg Oral Daily   heparin  5,000 Units Subcutaneous Q8H   hydrALAZINE  50 mg Oral TID   insulin aspart  0-6 Units Subcutaneous TID WC   insulin aspart  5 Units Subcutaneous TID WC   insulin glargine-yfgn  12 Units Subcutaneous QHS   multivitamin  1 tablet Oral QHS   pantoprazole  80 mg Oral Daily   sevelamer carbonate  1,600 mg Oral TID WC   sodium chloride flush  3 mL Intravenous Q12H   Continuous Infusions:  sodium chloride     sodium chloride 10 mL/hr at 09/19/21 1310   anticoagulant sodium citrate     DAPTOmycin (CUBICIN) 900 mg in sodium chloride 0.9 % IVPB 900 mg (10/02/21 2005)   methocarbamol (ROBAXIN) IV       LOS: 39 days    Time spent: 35 minutes    Lakai Moree Darleen Crocker, DO [facility]  If 7PM-7AM, please contact night-coverage [email or web address] 10/04/2021, 1:25 PM

## 2021-10-04 NOTE — Progress Notes (Signed)
Received patient in bed to unit.  Alert and oriented.  Informed consent signed and in  chart.   Treatment initiated: 1028 Treatment completed: 1230  Patient tolerated well.  Transported back to the room  alert, without acute distress.  Hand-off given to patient's nurse. Candice Applewhite  Access used: LAVF Access issues: None  Total UF removed: 4365mL Medication(s) given: Aranesp 235mcg Post HD VS: 114/58  Post HD weight: 84.2kg   [facility]

## 2021-10-04 NOTE — Progress Notes (Addendum)
[facility] Progress Note   Subjective:  Seen on HD - 4L UFG and tolerating.  Denies CP/dyspnea. Says PT is going ok.  Objective Vitals:   10/04/21 0450 10/04/21 0719 10/04/21 0740 10/04/21 0828  BP: (!) 127/52 (!) 138/56 (!) 138/56 (!) 128/53  Pulse: 60 62 60 (!) 58  Resp: 18 16 12 12   Temp: 98 F (36.7 C)  98.3 F (36.8 C)   TempSrc: Oral     SpO2: 98%  95% 99%  Weight:      Height:       Physical Exam General: Well appearing woman, NAD. Room air. 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 Dialysis Access: LUE AVF + bruit  Additional Objective Labs: Basic Metabolic Panel: Recent Labs  Lab 09/28/21 0223 09/29/21 1317 10/02/21 0350  NA 135 131* 129*  K 4.5 4.4 5.1  CL 97* 96* 96*  CO2 30 28 24   GLUCOSE 105* 140* 186*  BUN 34* 30* 47*  CREATININE 4.21* 3.91* 5.33*  CALCIUM 8.8* 8.7* 8.5*  PHOS  --  4.3  --    Liver Function Tests: Recent Labs  Lab 09/28/21 0223 09/29/21 1317 10/02/21 0350  AST 18  --  20  ALT 9  --  14  ALKPHOS 101  --  112  BILITOT 0.4  --  0.5  PROT 6.5  --  6.6  ALBUMIN 2.5* 2.7* 2.5*   CBC: Recent Labs  Lab 09/29/21 1317 10/02/21 0659 10/02/21 0808  WBC 9.7 6.8 6.5  HGB 9.8* 9.6* 9.6*  HCT 30.9* 30.9* 30.1*  MCV 93.4 92.2 93.2  PLT 205 194 179   CBG: Recent Labs  Lab 10/03/21 0757 10/03/21 1134 10/03/21 1631 10/03/21 2000 10/04/21 0725  GLUCAP 115* 162* 131* 135* 139*   Medications:  sodium chloride     sodium chloride 10 mL/hr at 09/19/21 1310   anticoagulant sodium citrate     DAPTOmycin (CUBICIN) 900 mg in sodium chloride 0.9 % IVPB 900 mg (10/02/21 2005)   methocarbamol (ROBAXIN) IV      alosetron  1 mg Oral BID   amitriptyline  25 mg Oral QHS   amLODipine  10 mg Oral Q24H   carvedilol  6.25 mg Oral BID   cyclobenzaprine  10 mg Oral TID   [START ON 10/05/2021] darbepoetin (ARANESP) injection - DIALYSIS  200  mcg Subcutaneous Q Sat-1800   docusate sodium  100 mg Oral BID   DULoxetine  60 mg Oral Daily   famotidine  20 mg Oral Daily   furosemide  80 mg Oral Daily   heparin  5,000 Units Subcutaneous Q8H   hydrALAZINE  50 mg Oral TID   insulin aspart  0-6 Units Subcutaneous TID WC   insulin aspart  5 Units Subcutaneous TID WC   insulin glargine-yfgn  12 Units Subcutaneous QHS   multivitamin  1 tablet Oral QHS   pantoprazole  80 mg Oral Daily   sevelamer carbonate  1,600 mg Oral TID WC   sodium chloride flush  3 mL Intravenous Q12H    Dialysis Orders: MWF at [facility], 400/500, EDW 90kg, 2K/2Ca, LUE AVF, heparin 4000 unit bolus  Assessment/Plan: 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 Back to OR 08/28/21, 08/31/21 for L hip debridement and wound vac placement (now removed). Will finish 6 week course of IV Daptomycin on 10/12/21. ESRD: Continue HD on MWF schedule. Intermittent  AVF cannulation issues - fine recently. Increasing heparin with HD as tolerated. Will continue to encourage to sit in chair for HD although this is not her biggest obstacle for discharge. Please see # [phone].   AVF Access: Issues with clots, cannulation. S/p branch ligation in OR 09/19/2021 per Dr. Virl Cagey.  HTN/ volume: BP controlled. On amlodipine/hydralazine/Coreg/Lasix. Now slightly under prior EDW. Still with some edema, UF as tolerated and has been reminded of importance of following fluid restrictions.  Anemia of ESRD: Hgb 9.6, continue Aranesp 267mcg weekly (will give today).  Secondary HPTH: Cca/Phos ok. Continue Renvela as binder. No VDRA for now. DM2: Insulin per primary Dispo: Patient has issues with no handicap ramp at her home. OP HD clinic cannot help her get in and out of her car when she arrives to HD. SNF recommended.  Last admission she was supposed to go to SNF but on day of discharge, she refused SNF placement. Very difficult situation.  Not clear what chance of success she will  have as OP regardless of her ability to sit in chair if her home situation is not improved.  Have reached out to renal MSW.  Veneta Penton, PA-C 10/04/2021, 8:47 AM  [place]  Nephrology attending:  Seen and examined in dialysis unit. Chart reviewed and agree with above.   Tolerating HD well, UF around 4 L. No c/o or concern.   Katheran James,  CKA

## 2021-10-05 DIAGNOSIS — M86252 Subacute osteomyelitis, left femur: Secondary | ICD-10-CM | POA: Diagnosis not present

## 2021-10-05 DIAGNOSIS — Z8673 Personal history of transient ischemic attack (TIA), and cerebral infarction without residual deficits: Secondary | ICD-10-CM | POA: Diagnosis not present

## 2021-10-05 DIAGNOSIS — N186 End stage renal disease: Secondary | ICD-10-CM | POA: Diagnosis not present

## 2021-10-05 DIAGNOSIS — E1169 Type 2 diabetes mellitus with other specified complication: Secondary | ICD-10-CM

## 2021-10-05 LAB — GLUCOSE, CAPILLARY
Glucose-Capillary: 154 mg/dL — ABNORMAL HIGH (ref 70–99)
Glucose-Capillary: 158 mg/dL — ABNORMAL HIGH (ref 70–99)
Glucose-Capillary: 183 mg/dL — ABNORMAL HIGH (ref 70–99)
Glucose-Capillary: 222 mg/dL — ABNORMAL HIGH (ref 70–99)

## 2021-10-05 NOTE — Progress Notes (Addendum)
[facility] Progress Note   Subjective:  Seen in room. No issues with HD yesterday - 4L net UF. No dyspnea or CP. Says therapy is "coming along."  Objective Vitals:   10/04/21 1644 10/04/21 2155 10/05/21 0558 10/05/21 0855  BP: (!) 120/52 (!) 125/53 (!) 132/50 (!) 143/62  Pulse: 64 62 64 65  Resp: 19 16 18    Temp: 98.6 F (37 C) 99.3 F (37.4 C) 98.2 F (36.8 C) 98.1 F (36.7 C)  TempSrc:  Oral Oral Oral  SpO2: 100% 97% 98% 99%  Weight:      Height:       Physical Exam General: Well appearing woman, NAD. Room air. 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 Dialysis Access: LUE AVF + bruit   Additional Objective Labs: Basic Metabolic Panel: Recent Labs  Lab 09/29/21 1317 10/02/21 0350 10/04/21 0820  NA 131* 129* 130*  K 4.4 5.1 4.5  CL 96* 96* 94*  CO2 28 24 27   GLUCOSE 140* 186* 162*  BUN 30* 47* 48*  CREATININE 3.91* 5.33* 5.72*  CALCIUM 8.7* 8.5* 8.9  PHOS 4.3  --  4.8*   Liver Function Tests: Recent Labs  Lab 09/29/21 1317 10/02/21 0350 10/04/21 0820  AST  --  20  --   ALT  --  14  --   ALKPHOS  --  112  --   BILITOT  --  0.5  --   PROT  --  6.6  --   ALBUMIN 2.7* 2.5* 2.5*   CBC: Recent Labs  Lab 09/29/21 1317 10/02/21 0659 10/02/21 0808 10/04/21 0820  WBC 9.7 6.8 6.5 6.8  HGB 9.8* 9.6* 9.6* 9.8*  HCT 30.9* 30.9* 30.1* 31.5*  MCV 93.4 92.2 93.2 92.4  PLT 205 194 179 272   Blood Culture    Component Value Date/Time   SDES TISSUE 08/28/2021 [place] TISSUE 08/28/2021 0917   CULT  08/28/2021 0917    MODERATE ENTEROCOCCUS FAECIUM VANCOMYCIN RESISTANT ENTEROCOCCUS ISOLATED NO ANAEROBES ISOLATED Sent to [facility] for further susceptibility testing. Performed at [facility], [place] [address], [place], [place] [address]    REPTSTATUS 09/02/2021 FINAL 08/28/2021 1025   Medications:  sodium chloride     sodium  chloride 10 mL/hr at 09/19/21 1310   DAPTOmycin (CUBICIN) 900 mg in sodium chloride 0.9 % IVPB 900 mg (10/04/21 2122)   methocarbamol (ROBAXIN) IV      alosetron  1 mg Oral BID   amitriptyline  25 mg Oral QHS   amLODipine  10 mg Oral Q24H   atorvastatin  40 mg Oral q1800   carvedilol  6.25 mg Oral BID   cyclobenzaprine  10 mg Oral TID   darbepoetin (ARANESP) injection - DIALYSIS  200 mcg Subcutaneous Q Fri-HD   docusate sodium  100 mg Oral BID   DULoxetine  60 mg Oral Daily   famotidine  20 mg Oral Daily   furosemide  80 mg Oral Daily   heparin  5,000 Units Subcutaneous Q8H   hydrALAZINE  50 mg Oral TID   insulin aspart  0-6 Units Subcutaneous TID WC   insulin aspart  5 Units Subcutaneous TID WC   insulin glargine-yfgn  12 Units Subcutaneous QHS   multivitamin  1 tablet Oral QHS   pantoprazole  80 mg Oral Daily   sevelamer carbonate  1,600 mg Oral TID WC   sodium  chloride flush  3 mL Intravenous Q12H    Dialysis Orders: MWF at [facility], 400/500, EDW 90kg, 2K/2Ca, LUE AVF, heparin 4000 unit bolus   Assessment/Plan: 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 Back to OR 08/28/21, 08/31/21 for L hip debridement and wound vac placement (now removed). Will finish 6 week course of IV Daptomycin on 10/12/21. ESRD: Continue HD on MWF schedule - next 8/7. Intermittent AVF cannulation issues - fine recently. Increasing heparin with HD as tolerated. Will continue to encourage to sit in chair for HD although this is not her biggest obstacle for discharge. Please see # 8.   AVF Access: Issues with clots, cannulation. S/p branch ligation in OR 09/19/2021 per Dr. Virl Cagey.  HTN/ volume: BP controlled. On amlodipine/hydralazine/Coreg/Lasix. Now slightly under prior EDW. Still with some edema, UF as tolerated and has been reminded of importance of following fluid restrictions.  Anemia of ESRD: Hgb 9.8, continue Aranesp 260mcg q Friday. Secondary HPTH: Cca/Phos ok.  Continue Renvela as binder. No VDRA for now. DM2: Insulin per primary Dispo: Patient has issues with no handicap ramp at her home. OP HD clinic cannot help her get in and out of her car when she arrives to HD. SNF recommended.  Last admission she was supposed to go to SNF but on day of discharge, she refused SNF placement. Very difficult situation.  Not clear what chance of success she will have as OP regardless of her ability to sit in chair if her home situation is not improved.  Have reached out to renal MSW.  Veneta Penton, PA-C 10/05/2021, 9:31 AM  [place]  Nephrology attending: I have personally seen and examined the patient.  Chart reviewed.  I agree with above.    Tolerating dialysis with ultrafiltration.  On IV antibiotics.  Encourage her to participate with physical therapy and out of bed to chair.  Next HD on Monday.  Katheran James, [facility].

## 2021-10-05 NOTE — Progress Notes (Signed)
  Progress Note   Patient: [name] PQD:[id number] DOB: 02-19-1966 DOA: 08/26/2021     40 DOS: the patient was seen and examined on 10/05/2021   Brief hospital course: [name] was admitted to the hospital with the working diagnosis of subacute osteomyelitis left intertrochanteric fracture.   56 year old woman PMH including ESRD, complicated left hip and femur issues requiring multiple surgeries, removal of hardware presented 6/26 with bloody drainage from surgical incision.  Seen by orthopedics and underwent I&D 6/28 and 7/1.  Currently unable to progress care secondary to refusal to sit in chair for HD.  Assessment and Plan: * Subacute osteomyelitis of left femur with abscess  Subacute osteomyelitis of the left femur with abscess, infected left hip hardware sp removal, left intertrochanteric fracture.   08/04 wound vac removed per orthopedics.  Plan to continue antibiotic therapy with daptomycin for a total of 6 weekms to be completed on 10/12/21. Pending placement.   ESRD (end stage renal disease) on dialysis ([place]) Sp fistula revision and branch ligation.  Patient very deconditioned and not able to tolerate chair HD. Plan to continue PT in SNF Continue renal replacement therapy M-W-F.  Continue furosemide 80 mg daily.   Anemia of chronic due to renal disease  SP 2 units PRBC transfusion, continue close follow up on Hgb and hct.  Continue with EPO   History of CVA (cerebrovascular accident) Continue statin   Type 2 diabetes mellitus with hyperlipidemia (HCC) A1C of 7.1 in 07/2021 Uncontrolled hyperglycemia.  Continue insulin therapy basal and short acting.   Continue with statin therapy.   Essential hypertension Continue blood pressure control with amlodipine, carvedilol and hydralazine.   Anxiety and depression Continue cymbalta and elavil at night   GASTROESOPHAGEAL REFLUX, NO ESOPHAGITIS Continue her dexilant   TOBACCO DEPENDENCE Encouraged cessation,  declines patch         Subjective: Patient is feeling well, left hip pain is controlled, no dyspnea or chest pain, continue very weak and deconditioned   Physical Exam: Vitals:   10/04/21 1644 10/04/21 2155 10/05/21 0558 10/05/21 0855  BP: (!) 120/52 (!) 125/53 (!) 132/50 (!) 143/62  Pulse: 64 62 64 65  Resp: 19 16 18    Temp: 98.6 F (37 C) 99.3 F (37.4 C) 98.2 F (36.8 C) 98.1 F (36.7 C)  TempSrc:  Oral Oral Oral  SpO2: 100% 97% 98% 99%  Weight:      Height:        Neurology awake and alert ENT with mild pallor Cardiovascular with S1 and S2 present and rhythmic Respiratory with no rales or wheezing Abdomen not distended 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  Data Reviewed:    Family Communication: no family at the bedside   Disposition: Status is: Inpatient Remains inpatient appropriate because: pending placement, patient medically stable for discharge   Planned Discharge Destination: Skilled nursing facility  Author: Tawni Millers, MD 10/05/2021 11:29 AM  For on call review [email or web address].

## 2021-10-05 NOTE — Plan of Care (Signed)
  Problem: Pain Managment: Goal: General experience of comfort will improve Outcome: Progressing   

## 2021-10-06 DIAGNOSIS — Z8673 Personal history of transient ischemic attack (TIA), and cerebral infarction without residual deficits: Secondary | ICD-10-CM | POA: Diagnosis not present

## 2021-10-06 DIAGNOSIS — N186 End stage renal disease: Secondary | ICD-10-CM | POA: Diagnosis not present

## 2021-10-06 DIAGNOSIS — M86252 Subacute osteomyelitis, left femur: Secondary | ICD-10-CM | POA: Diagnosis not present

## 2021-10-06 DIAGNOSIS — T148XXA Other injury of unspecified body region, initial encounter: Secondary | ICD-10-CM | POA: Diagnosis not present

## 2021-10-06 LAB — GLUCOSE, CAPILLARY
Glucose-Capillary: 135 mg/dL — ABNORMAL HIGH (ref 70–99)
Glucose-Capillary: 162 mg/dL — ABNORMAL HIGH (ref 70–99)
Glucose-Capillary: 179 mg/dL — ABNORMAL HIGH (ref 70–99)
Glucose-Capillary: 135 mg/dL — ABNORMAL HIGH (ref 70–99)

## 2021-10-06 NOTE — Progress Notes (Addendum)
  Progress Note   Patient: [name] GQB:[id number] DOB: 08/14/1965 DOA: 08/26/2021     41 DOS: the patient was seen and examined on 10/06/2021   Brief hospital course: [name] was admitted to the hospital with the working diagnosis of subacute osteomyelitis left intertrochanteric fracture.   [name] PMH including ESRD, complicated left hip and femur issues requiring multiple surgeries, removal of hardware presented 6/26 with bloody drainage from surgical incision.  Seen by orthopedics and underwent I&D 6/28 and 7/1.  Currently unable to progress care secondary to refusal to sit in chair for HD.  Assessment and Plan: * Subacute osteomyelitis of left femur with abscess  Subacute osteomyelitis of the left femur with abscess, infected left hip hardware sp removal, left intertrochanteric fracture.   08/04 wound vac removed per orthopedics.  Plan to continue antibiotic therapy with daptomycin for a total of 6 weekms to be completed on 10/12/21. Pending placement.   ESRD (end stage renal disease) on dialysis ([place]) Sp fistula revision and branch ligation.  Patient very deconditioned and not able to tolerate chair HD. Plan to continue PT in SNF Continue renal replacement therapy M-W-F.  Continue furosemide 80 mg daily.   Anemia of chronic due to renal disease  SP 2 units PRBC transfusion, continue close follow up on Hgb and hct.  Continue with EPO   History of CVA (cerebrovascular accident) Continue statin   Type 2 diabetes mellitus with hyperlipidemia (HCC) A1C of 7.1 in 07/2021 Uncontrolled hyperglycemia.  Capillary glucose 222, 135, 162  Continue insulin therapy basal and short acting.   Continue with statin therapy.   Essential hypertension Continue blood pressure control with amlodipine, carvedilol and hydralazine.   Anxiety and depression Continue cymbalta and elavil at night   GASTROESOPHAGEAL REFLUX, NO ESOPHAGITIS Continue her dexilant   TOBACCO  DEPENDENCE Encouraged cessation, declines patch     Subjective: Patient with no chest pain or dyspnea. Continue very weak and deconditioned   Physical Exam: Vitals:   10/05/21 1632 10/05/21 2035 10/06/21 0529 10/06/21 0939  BP: 126/85 116/64 (!) 151/56 (!) 142/60  Pulse: 63 63 63 69  Resp: 18 17 18 17   Temp: 98.7 F (37.1 C) 99 F (37.2 C) 97.8 F (36.6 C) 98.8 F (37.1 C)  TempSrc: Oral Oral Oral Oral  SpO2: 99% 98% 98% 97%  Weight:      Height:       Neurology awake and alert ENT with mild pallor Cardiovascular with S1 and S2 present and rhythmic with no gallops Respiratory with no rales or wheezing Abdomen not distended Right lower extremity with no edema, positive Left BKA  Data Reviewed:    Family Communication: no family at the bedside   Disposition: Status is: Inpatient Remains inpatient appropriate because: pending placement   Planned Discharge Destination:  Snf      Author: Tawni Millers, MD 10/06/2021 1:11 PM  For on call review [email or web address].

## 2021-10-06 NOTE — Plan of Care (Signed)
  Problem: Activity: Goal: Risk for activity intolerance will decrease Outcome: Progressing   Problem: Pain Managment: Goal: General experience of comfort will improve Outcome: Progressing   

## 2021-10-06 NOTE — Progress Notes (Addendum)
[facility] Progress Note   Subjective:  Seen in room. No overnight issues, no CP/dyspnea. Suggested HD in recliner tomorrow, she doesn't think she is quite ready.  Objective Vitals:   10/05/21 0855 10/05/21 1632 10/05/21 2035 10/06/21 0529  BP: (!) 143/62 126/85 116/64 (!) 151/56  Pulse: 65 63 63 63  Resp:  18 17 18   Temp: 98.1 F (36.7 C) 98.7 F (37.1 C) 99 F (37.2 C) 97.8 F (36.6 C)  TempSrc: Oral Oral Oral Oral  SpO2: 99% 99% 98% 98%  Weight:      Height:       Physical Exam General: Well appearing woman, NAD. Room air. 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 Dialysis Access: LUE AVF + bruit  Additional Objective Labs: Basic Metabolic Panel: Recent Labs  Lab 09/29/21 1317 10/02/21 0350 10/04/21 0820  NA 131* 129* 130*  K 4.4 5.1 4.5  CL 96* 96* 94*  CO2 28 24 27   GLUCOSE 140* 186* 162*  BUN 30* 47* 48*  CREATININE 3.91* 5.33* 5.72*  CALCIUM 8.7* 8.5* 8.9  PHOS 4.3  --  4.8*   Liver Function Tests: Recent Labs  Lab 09/29/21 1317 10/02/21 0350 10/04/21 0820  AST  --  20  --   ALT  --  14  --   ALKPHOS  --  112  --   BILITOT  --  0.5  --   PROT  --  6.6  --   ALBUMIN 2.7* 2.5* 2.5*   CBC: Recent Labs  Lab 09/29/21 1317 10/02/21 0659 10/02/21 0808 10/04/21 0820  WBC 9.7 6.8 6.5 6.8  HGB 9.8* 9.6* 9.6* 9.8*  HCT 30.9* 30.9* 30.1* 31.5*  MCV 93.4 92.2 93.2 92.4  PLT 205 194 179 272   Blood Culture    Component Value Date/Time   SDES TISSUE 08/28/2021 [place] TISSUE 08/28/2021 0917   CULT  08/28/2021 0917    MODERATE ENTEROCOCCUS FAECIUM VANCOMYCIN RESISTANT ENTEROCOCCUS ISOLATED NO ANAEROBES ISOLATED Sent to [place] for further susceptibility testing. Performed at [facility], [place] [address], [place], [place] [address]    REPTSTATUS 09/02/2021 FINAL 08/28/2021 4270   Medications:  sodium chloride      sodium chloride 10 mL/hr at 09/19/21 1310   DAPTOmycin (CUBICIN) 900 mg in sodium chloride 0.9 % IVPB 900 mg (10/04/21 2122)   methocarbamol (ROBAXIN) IV      alosetron  1 mg Oral BID   amitriptyline  25 mg Oral QHS   amLODipine  10 mg Oral Q24H   atorvastatin  40 mg Oral q1800   carvedilol  6.25 mg Oral BID   cyclobenzaprine  10 mg Oral TID   darbepoetin (ARANESP) injection - DIALYSIS  200 mcg Subcutaneous Q Fri-HD   docusate sodium  100 mg Oral BID   DULoxetine  60 mg Oral Daily   famotidine  20 mg Oral Daily   furosemide  80 mg Oral Daily   heparin  5,000 Units Subcutaneous Q8H   hydrALAZINE  50 mg Oral TID   insulin aspart  0-6 Units Subcutaneous TID WC   insulin aspart  5 Units Subcutaneous TID WC   insulin glargine-yfgn  12 Units Subcutaneous QHS   multivitamin  1 tablet Oral QHS   pantoprazole  80 mg Oral Daily   sevelamer carbonate  1,600 mg Oral TID WC   sodium chloride flush  3  mL Intravenous Q12H    Dialysis Orders: MWF at [facility], 400/500, EDW 90kg, 2K/2Ca, LUE AVF, heparin 4000 unit bolus   Assessment/Plan: 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 Back to OR 08/28/21, 08/31/21 for L hip debridement and wound vac placement (now removed). Will finish 6 week course of IV Daptomycin on 10/12/21. ESRD: Continue HD on MWF schedule - next 8/7. Intermittent AVF cannulation issues - fine recently. Increasing heparin with HD as tolerated. Will continue to encourage to sit in chair for HD although this is not her biggest obstacle for discharge. Please see # 8.   AVF Access: Issues with clots, cannulation. S/p branch ligation in OR 09/19/2021 per Dr. Virl Cagey.  HTN/ volume: BP controlled. On amlodipine/hydralazine/Coreg/Lasix. Now slightly under prior EDW. Still with some edema, UF as tolerated and has been reminded of importance of following fluid restrictions.  Anemia of ESRD: Hgb 9.8, continue Aranesp 212mcg q Friday. Secondary HPTH: CCa/Phos ok.  Continue Renvela as binder. No VDRA for now. DM2: Insulin per primary Dispo: Patient has issues with no handicap ramp at her home. OP HD clinic cannot help her get in and out of her car when she arrives to HD. SNF recommended.  Last admission she was supposed to go to SNF but on day of discharge, she refused SNF placement. Very difficult situation.  Not clear what chance of success she will have as OP regardless of her ability to sit in chair if her home situation is not improved.  Have reached out to renal MSW.    Veneta Penton, PA-C 10/06/2021, 9:35 AM  [place]  Nephrology attending: I have personally seen and examined the patient.  Chart reviewed.  I agree with above. ESRD on HD, next dialysis tomorrow.  I encouraged her to ambulate and sit on chair during dialysis.  Awaiting safe discharge to SNF.  Currently on antibiotics.  Katheran James, [facility].

## 2021-10-07 DIAGNOSIS — E871 Hypo-osmolality and hyponatremia: Secondary | ICD-10-CM | POA: Diagnosis not present

## 2021-10-07 DIAGNOSIS — M86252 Subacute osteomyelitis, left femur: Secondary | ICD-10-CM | POA: Diagnosis not present

## 2021-10-07 DIAGNOSIS — E875 Hyperkalemia: Secondary | ICD-10-CM | POA: Diagnosis not present

## 2021-10-07 LAB — RENAL FUNCTION PANEL
Albumin: 2.7 g/dL — ABNORMAL LOW (ref 3.5–5.0)
Anion gap: 11 (ref 5–15)
BUN: 62 mg/dL — ABNORMAL HIGH (ref 6–20)
CO2: 25 mmol/L (ref 22–32)
Calcium: 9 mg/dL (ref 8.9–10.3)
Chloride: 91 mmol/L — ABNORMAL LOW (ref 98–111)
Creatinine, Ser: 6.8 mg/dL — ABNORMAL HIGH (ref 0.44–1.00)
GFR, Estimated: 7 mL/min — ABNORMAL LOW (ref >60)
Glucose, Bld: 131 mg/dL — ABNORMAL HIGH (ref 70–99)
Phosphorus: 5.7 mg/dL — ABNORMAL HIGH (ref 2.5–4.6)
Potassium: 5.5 mmol/L — ABNORMAL HIGH (ref 3.5–5.1)
Sodium: 127 mmol/L — ABNORMAL LOW (ref 135–145)

## 2021-10-07 LAB — CBC
HCT: 33.1 % — ABNORMAL LOW (ref 36.0–46.0)
Hemoglobin: 10.3 g/dL — ABNORMAL LOW (ref 12.0–15.0)
MCH: 28.5 pg (ref 26.0–34.0)
MCHC: 31.1 g/dL (ref 30.0–36.0)
MCV: 91.7 fL (ref 80.0–100.0)
Platelets: 352 10*3/uL (ref 150–400)
RBC: 3.61 MIL/uL — ABNORMAL LOW (ref 3.87–5.11)
RDW: 15.8 % — ABNORMAL HIGH (ref 11.5–15.5)
WBC: 9.2 10*3/uL (ref 4.0–10.5)
nRBC: 0 % (ref 0.0–0.2)

## 2021-10-07 LAB — GLUCOSE, CAPILLARY
Glucose-Capillary: 110 mg/dL — ABNORMAL HIGH (ref 70–99)
Glucose-Capillary: 141 mg/dL — ABNORMAL HIGH (ref 70–99)
Glucose-Capillary: 176 mg/dL — ABNORMAL HIGH (ref 70–99)
Glucose-Capillary: 131 mg/dL — ABNORMAL HIGH (ref 70–99)

## 2021-10-07 NOTE — Progress Notes (Signed)
PROGRESS NOTE    KINDRED HEYING  EZM:[id number] DOB: January 31, 1966 DOA: 08/26/2021 PCP: [name], MD   Brief Narrative:  [name] was admitted to the hospital with the working diagnosis of subacute osteomyelitis left intertrochanteric fracture.   56 year old woman PMH including ESRD, complicated left hip and femur issues requiring multiple surgeries, removal of hardware presented 6/26 with bloody drainage from surgical incision.  Seen by orthopedics and underwent I&D 6/28 and 7/1.  Currently unable to progress care secondary to refusal to sit in chair for HD.    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  Active Problems:   ESRD (end stage renal disease) on dialysis (HCC)   Type 2 diabetes mellitus with hyperlipidemia (HCC)   History of CVA (cerebrovascular accident)   Essential hypertension   Anxiety and depression   GASTROESOPHAGEAL REFLUX, NO ESOPHAGITIS   TOBACCO DEPENDENCE   Wound infection   Wound dehiscence, surgical, sequela  Assessment and Plan:   Subacute osteomyelitis of the left femur with abscess, infected left hip hardware status post removal, left intertrochanteric fracture --Wound VAC removed on 8/4 by Ortho, continue wound care, cleared by Dr. [name] for discharge; follow-up with orthopedics as an outpatient --per ID daptomycin for 6 weeks with dialysis, stop date 8/12. Follow-up with Dr. Candiss Norse, ID and the [facility] clinic   ESRD --Dialysis MWF.  Patient needs to be able to sit in chair to tolerate outpatient dialysis for discharge --Nephrology managing --s/p fistula revision and branch ligation. -Cont to not tolerate HD. Discussed with Nephrology - recommendations for SNF as pt would not be safe for home d/c (no ramp to get into home) -Cont fluid restriction, concerns were raised regarding compliance to fluid intake -HD today; brief run of 2 hours, reported machine problems, asked to be taken off, underwent laying  down in bed.  Patient states that she is still not ready to have HD on chair for several complaints, pain, fluid, weakness etc. -Remains on Lasix 80 mg orally daily.  Hyponatremia Suspect due to renal disease and volume.  Management per nephrology.  Hyperkalemia Potassium 5.5 on 8/7, management across HD.     Anemia of chronic disease/CKD and, component of acute blood loss anemia from wound --Status post 2 unit packed red blood cells 6/29 --Hemoglobin stable in the 9-10 range. -Continue Aranesp per nephrology.   Uncontrolled type 2 diabetes with hypoglycemia --A1c 7.1 in 07/2021. --CBG stable. Continue Semglee, NovoLog, sliding scale. CBG stable  Essential hypertension --stable. Continue amlodipine, Coreg, hydralazine and Lasix.   Hyperlipidemia --continue Lipitor  History of CVA Continue statins.  Anxiety and depression: Continue Cymbalta and Elavil  GERD, no esophagitis Continue Dexilant  Tobacco dependence Cessation counseled, declines patch.     DVT prophylaxis: heparin subq Code Status: Full Family Communication: None at bedside   Status is: Inpatient Remains inpatient appropriate because: Severity of illness   Consultants:  Nephrology Orthopedic Surgery    Antimicrobials:  Anti-infectives (From admission, onward)    Start     Dose/Rate Route Frequency Ordered Stop   10/02/21 2000  DAPTOmycin (CUBICIN) 900 mg in sodium chloride 0.9 % IVPB        10 mg/kg  92.4 kg 136 mL/hr over 30 Minutes Intravenous Once per day on Mon Wed Fri 10/01/21 1350     09/30/21 2000  DAPTOmycin (CUBICIN) 900 mg in sodium chloride 0.9 % IVPB  Status:  Discontinued        10  mg/kg  92.4 kg 136 mL/hr over 30 Minutes Intravenous Daily 09/28/21 0919 10/01/21 1350   09/28/21 2000  DAPTOmycin (CUBICIN) 900 mg in sodium chloride 0.9 % IVPB        900 mg 136 mL/hr over 30 Minutes Intravenous  Once 09/28/21 0919 09/28/21 2146   09/02/21 2000  DAPTOmycin (CUBICIN) 900 mg in sodium  chloride 0.9 % IVPB  Status:  Discontinued        10 mg/kg  92.4 kg 136 mL/hr over 30 Minutes Intravenous Once per day on Mon Wed Fri 09/02/21 0836 09/28/21 0919   08/31/21 0745  ceFAZolin (ANCEF) IVPB 2g/100 mL premix        2 g 200 mL/hr over 30 Minutes Intravenous To Short Stay 08/31/21 0730 08/31/21 0918   08/30/21 0600  ceFAZolin (ANCEF) IVPB 2g/100 mL premix  Status:  Discontinued        2 g 200 mL/hr over 30 Minutes Intravenous To Short Stay 08/30/21 0123 08/31/21 0600   08/29/21 2000  DAPTOmycin (CUBICIN) 900 mg in sodium chloride 0.9 % IVPB  Status:  Discontinued        10 mg/kg  92.4 kg 136 mL/hr over 30 Minutes Intravenous Every 48 hours 08/29/21 1627 09/02/21 0836   08/28/21 1800  ceFAZolin (ANCEF) IVPB 2g/100 mL premix  Status:  Discontinued        2 g 200 mL/hr over 30 Minutes Intravenous Every M-W-F (1800) 08/27/21 0846 08/29/21 1620   08/28/21 1200  ceFAZolin (ANCEF) IVPB 1 g/50 mL premix  Status:  Discontinued        1 g 100 mL/hr over 30 Minutes Intravenous Every M-W-F (Hemodialysis) 08/26/21 1954 08/27/21 0846   08/28/21 0600  ceFAZolin (ANCEF) IVPB 2g/100 mL premix        2 g 200 mL/hr over 30 Minutes Intravenous On call to O.R. 08/27/21 1150 08/28/21 0909   08/27/21 1800  ceFAZolin (ANCEF) IVPB 1 g/50 mL premix        1 g 100 mL/hr over 30 Minutes Intravenous  Once 08/26/21 1954 08/27/21 2048   08/26/21 1500  ceFEPIme (MAXIPIME) 1 g in sodium chloride 0.9 % 100 mL IVPB  Status:  Discontinued        1 g 200 mL/hr over 30 Minutes Intravenous Every 24 hours 08/26/21 1452 08/26/21 1714       Subjective: Seen this morning while still in HD department.  Reports that she had been on HD and the machine was not working well and hence requested to come off.  Reports fluid on her right flank, weakness of her right leg, pain at her left hip surgical site as reasons for not being ready to get up on chair for HD.  Objective: Vitals:   10/07/21 1031 10/07/21 1100 10/07/21  1130 10/07/21 1151  BP: (!) 132/59 (!) 144/57 (!) 144/61 138/83  Pulse: 60 64 69 62  Resp: 10 10 11 15   Temp:    97.7 F (36.5 C)  TempSrc:    Oral  SpO2: 99% 98% 95% 98%  Weight:    91 kg  Height:        Intake/Output Summary (Last 24 hours) at 10/07/2021 1400 Last data filed at 10/07/2021 1151 Gross per 24 hour  Intake 460 ml  Output 0.6 ml  Net 459.4 ml   Filed Weights   10/03/21 1957 10/04/21 0828 10/07/21 1151  Weight: 89.8 kg 89.1 kg 91 kg    Examination:  General exam: Young female, moderately built  and nourished lying comfortably propped up in bed without distress. Respiratory system: Clear to auscultation.  No increased work of breathing. Cardiovascular system: S1 and S2 heard, RRR.  No JVD, murmurs or pedal edema.  Not on telemetry. Gastrointestinal system: Abdomen is nondistended, soft and nontender.  No organomegaly or masses appreciated.  Normal bowel sounds heard.  Right lateral lower quadrant with some subcutaneous pitting edema Central nervous system: Alert and oriented.  No focal neurological deficits. Extremities: Left hip surgical site dressing clean and dry.  Left BKA stump seems to have healed well.  Right leg with no edema. Psychiatry: Appropriate affect.    Data Reviewed: I have personally reviewed following labs and imaging studies  CBC: Recent Labs  Lab 10/02/21 0659 10/02/21 0808 10/04/21 0820 10/07/21 0912  WBC 6.8 6.5 6.8 9.2  HGB 9.6* 9.6* 9.8* 10.3*  HCT 30.9* 30.1* 31.5* 33.1*  MCV 92.2 93.2 92.4 91.7  PLT 194 179 272 956   Basic Metabolic Panel: Recent Labs  Lab 10/02/21 0350 10/04/21 0820 10/07/21 0913  NA 129* 130* 127*  K 5.1 4.5 5.5*  CL 96* 94* 91*  CO2 24 27 25   GLUCOSE 186* 162* 131*  BUN 47* 48* 62*  CREATININE 5.33* 5.72* 6.80*  CALCIUM 8.5* 8.9 9.0  PHOS  --  4.8* 5.7*   GFR: Estimated Creatinine Clearance: 11.6 mL/min (A) (by C-G formula based on SCr of 6.8 mg/dL (H)). Liver Function Tests: Recent Labs  Lab  10/02/21 0350 10/04/21 0820 10/07/21 0913  AST 20  --   --   ALT 14  --   --   ALKPHOS 112  --   --   BILITOT 0.5  --   --   PROT 6.6  --   --   ALBUMIN 2.5* 2.5* 2.7*    Cardiac Enzymes: Recent Labs  Lab 10/03/21 0435  CKTOTAL 21*    CBG: Recent Labs  Lab 10/06/21 1208 10/06/21 1652 10/06/21 2110 10/07/21 0716 10/07/21 1313  GLUCAP 162* 179* 135* 110* 141*     No results found for this or any previous visit (from the past 240 hour(s)).       Radiology Studies: No results found.      Scheduled Meds:  alosetron  1 mg Oral BID   amitriptyline  25 mg Oral QHS   amLODipine  10 mg Oral Q24H   atorvastatin  40 mg Oral q1800   carvedilol  6.25 mg Oral BID   cyclobenzaprine  10 mg Oral TID   darbepoetin (ARANESP) injection - DIALYSIS  200 mcg Subcutaneous Q Fri-HD   docusate sodium  100 mg Oral BID   DULoxetine  60 mg Oral Daily   famotidine  20 mg Oral Daily   furosemide  80 mg Oral Daily   heparin  5,000 Units Subcutaneous Q8H   hydrALAZINE  50 mg Oral TID   insulin aspart  0-6 Units Subcutaneous TID WC   insulin aspart  5 Units Subcutaneous TID WC   insulin glargine-yfgn  12 Units Subcutaneous QHS   multivitamin  1 tablet Oral QHS   pantoprazole  80 mg Oral Daily   sevelamer carbonate  1,600 mg Oral TID WC   sodium chloride flush  3 mL Intravenous Q12H   Continuous Infusions:  sodium chloride     sodium chloride 10 mL/hr at 09/19/21 1310   DAPTOmycin (CUBICIN) 900 mg in sodium chloride 0.9 % IVPB 900 mg (10/04/21 2122)   methocarbamol (ROBAXIN) IV  LOS: 11 days       Vernell Leep, MD,  FACP, [facility], [facility], [facility] (Care Management Physician Certified) [facility] [place]  To contact the attending provider between 7A-7P or the covering provider during after hours 7P-7A, please log into the web site [email or web address] and access using universal [place] password for that web site. If you do not have the password,  please call the hospital operator.

## 2021-10-07 NOTE — Plan of Care (Signed)
  Problem: Pain Managment: Goal: General experience of comfort will improve Outcome: Progressing   

## 2021-10-07 NOTE — Progress Notes (Signed)
[facility] Progress Note   Subjective:  Seen in HD unit. No new complaints. Tolerating UF. Counseled about fluid restriction.   Objective Vitals:   10/07/21 0900 10/07/21 0935 10/07/21 1000 10/07/21 1031  BP: 133/66 (!) 142/57 (!) 142/64 (!) 132/59  Pulse: 61 64 64 60  Resp: 13 14 13 10   Temp: 98 F (36.7 C)     TempSrc: Oral     SpO2: 100% 100% 95% 99%  Weight:      Height:       Physical Exam General: Well appearing woman, NAD. Room air. 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 Dialysis Access: LUE AVF + bruit  Additional Objective Labs: Basic Metabolic Panel: Recent Labs  Lab 10/02/21 0350 10/04/21 0820 10/07/21 0913  NA 129* 130* 127*  K 5.1 4.5 5.5*  CL 96* 94* 91*  CO2 24 27 25   GLUCOSE 186* 162* 131*  BUN 47* 48* 62*  CREATININE 5.33* 5.72* 6.80*  CALCIUM 8.5* 8.9 9.0  PHOS  --  4.8* 5.7*    Liver Function Tests: Recent Labs  Lab 10/02/21 0350 10/04/21 0820 10/07/21 0913  AST 20  --   --   ALT 14  --   --   ALKPHOS 112  --   --   BILITOT 0.5  --   --   PROT 6.6  --   --   ALBUMIN 2.5* 2.5* 2.7*    CBC: Recent Labs  Lab 10/02/21 0659 10/02/21 0808 10/04/21 0820 10/07/21 0912  WBC 6.8 6.5 6.8 9.2  HGB 9.6* 9.6* 9.8* 10.3*  HCT 30.9* 30.1* 31.5* 33.1*  MCV 92.2 93.2 92.4 91.7  PLT 194 179 272 352    Blood Culture    Component Value Date/Time   SDES TISSUE 08/28/2021 [place] TISSUE 08/28/2021 0917   CULT  08/28/2021 0917    MODERATE ENTEROCOCCUS FAECIUM VANCOMYCIN RESISTANT ENTEROCOCCUS ISOLATED NO ANAEROBES ISOLATED Sent to [place] for further susceptibility testing. Performed at [facility], [place] [address] [place], [place] [address]    REPTSTATUS 09/02/2021 FINAL 08/28/2021 3086   Medications:  sodium chloride     sodium chloride 10 mL/hr at 09/19/21 1310   DAPTOmycin (CUBICIN) 900 mg in sodium  chloride 0.9 % IVPB 900 mg (10/04/21 2122)   methocarbamol (ROBAXIN) IV      alosetron  1 mg Oral BID   amitriptyline  25 mg Oral QHS   amLODipine  10 mg Oral Q24H   atorvastatin  40 mg Oral q1800   carvedilol  6.25 mg Oral BID   cyclobenzaprine  10 mg Oral TID   darbepoetin (ARANESP) injection - DIALYSIS  200 mcg Subcutaneous Q Fri-HD   docusate sodium  100 mg Oral BID   DULoxetine  60 mg Oral Daily   famotidine  20 mg Oral Daily   furosemide  80 mg Oral Daily   heparin  5,000 Units Subcutaneous Q8H   hydrALAZINE  50 mg Oral TID   insulin aspart  0-6 Units Subcutaneous TID WC   insulin aspart  5 Units Subcutaneous TID WC   insulin glargine-yfgn  12 Units Subcutaneous QHS   multivitamin  1 tablet Oral QHS   pantoprazole  80 mg Oral Daily   sevelamer carbonate  1,600 mg Oral TID WC   sodium chloride flush  3 mL Intravenous Q12H    Dialysis Orders: MWF at [facility] 4hr, 400/500, EDW 90kg, 2K/2Ca, LUE AVF, heparin 4000 unit bolus   Assessment/Plan: 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 Back to OR 08/28/21, 08/31/21 for L hip debridement and wound vac placement (now removed). Will finish 6 week course of IV Daptomycin on 10/12/21. ESRD: Continue HD on MWF schedule. HD today . Intermittent AVF cannulation issues - fine recently. Increasing heparin with HD as tolerated. Will continue to encourage to sit in chair for HD although this is not her biggest obstacle for discharge. Please see # 8.   AVF Access: Issues with clots, cannulation. S/p branch ligation in OR 09/19/2021 per Dr. Virl Cagey.  HTN/ volume: BP controlled. On amlodipine/hydralazine/Coreg/Lasix. Now slightly under prior EDW. Still with some edema, UF as tolerated and has been reminded of importance of following fluid restrictions.  Anemia of ESRD: Hgb 10.3, continue Aranesp 258mcg q Friday. Secondary HPTH: CCa/Phos ok. Continue Renvela as binder. No VDRA for now. DM2: Insulin per primary Dispo:  Patient has issues with no handicap ramp at her home. OP HD clinic cannot help her get in and out of her car when she arrives to HD. SNF recommended.  Last admission she was supposed to go to SNF but on day of discharge, she refused SNF placement. Very difficult situation.  Not clear what chance of success she will have as OP regardless of her ability to sit in chair if her home situation is not improved.  Have reached out to renal MSW.    Lynnda Child PA-C [facility] 10/07/2021,10:52 AM

## 2021-10-08 DIAGNOSIS — E871 Hypo-osmolality and hyponatremia: Secondary | ICD-10-CM | POA: Diagnosis not present

## 2021-10-08 DIAGNOSIS — M86252 Subacute osteomyelitis, left femur: Secondary | ICD-10-CM | POA: Diagnosis not present

## 2021-10-08 DIAGNOSIS — E875 Hyperkalemia: Secondary | ICD-10-CM | POA: Diagnosis not present

## 2021-10-08 LAB — GLUCOSE, CAPILLARY
Glucose-Capillary: 142 mg/dL — ABNORMAL HIGH (ref 70–99)
Glucose-Capillary: 128 mg/dL — ABNORMAL HIGH (ref 70–99)
Glucose-Capillary: 126 mg/dL — ABNORMAL HIGH (ref 70–99)
Glucose-Capillary: 93 mg/dL (ref 70–99)

## 2021-10-08 LAB — HEPATITIS B SURFACE ANTIBODY, QUANTITATIVE: Hep B S AB Quant (Post): <3.1 m[IU]/mL — ABNORMAL LOW (ref >9.9)

## 2021-10-08 MED ORDER — CHLORHEXIDINE GLUCONATE CLOTH 2 % EX PADS
6.0000 | MEDICATED_PAD | Freq: Every day | CUTANEOUS | Status: DC
Start: 2021-10-08 — End: 2021-10-08
  Administered 2021-10-08: 6 via TOPICAL

## 2021-10-08 NOTE — Progress Notes (Signed)
[facility] Progress Note   Subjective:  Signed off dialysis early yesterday because machine was alarming. No chest pain/dyspnea.   Objective Vitals:   10/07/21 1151 10/07/21 2120 10/08/21 0508 10/08/21 0951  BP: 138/83 (!) 131/54 (!) 126/54 (!) 141/50  Pulse: 62 60 (!) 58 63  Resp: 15 18 20 18   Temp: 97.7 F (36.5 C) 98.9 F (37.2 C) 98.3 F (36.8 C) 98.2 F (36.8 C)  TempSrc: Oral Oral Oral   SpO2: 98% 100% 100% 97%  Weight: 91 kg     Height:       Physical Exam General: Well appearing woman, NAD. Room air. 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 Dialysis Access: LUE AVF + bruit  Additional Objective Labs: Basic Metabolic Panel: Recent Labs  Lab 10/02/21 0350 10/04/21 0820 10/07/21 0913  NA 129* 130* 127*  K 5.1 4.5 5.5*  CL 96* 94* 91*  CO2 24 27 25   GLUCOSE 186* 162* 131*  BUN 47* 48* 62*  CREATININE 5.33* 5.72* 6.80*  CALCIUM 8.5* 8.9 9.0  PHOS  --  4.8* 5.7*    Liver Function Tests: Recent Labs  Lab 10/02/21 0350 10/04/21 0820 10/07/21 0913  AST 20  --   --   ALT 14  --   --   ALKPHOS 112  --   --   BILITOT 0.5  --   --   PROT 6.6  --   --   ALBUMIN 2.5* 2.5* 2.7*    CBC: Recent Labs  Lab 10/02/21 0659 10/02/21 0808 10/04/21 0820 10/07/21 0912  WBC 6.8 6.5 6.8 9.2  HGB 9.6* 9.6* 9.8* 10.3*  HCT 30.9* 30.1* 31.5* 33.1*  MCV 92.2 93.2 92.4 91.7  PLT 194 179 272 352    Blood Culture    Component Value Date/Time   SDES TISSUE 08/28/2021 [place] TISSUE 08/28/2021 0917   CULT  08/28/2021 0917    MODERATE ENTEROCOCCUS FAECIUM VANCOMYCIN RESISTANT ENTEROCOCCUS ISOLATED NO ANAEROBES ISOLATED Sent to [place] for further susceptibility testing. Performed at [facility], [place] [address], [place], [place] [address]    REPTSTATUS 09/02/2021 FINAL 08/28/2021 1027   Medications:  sodium chloride     sodium  chloride 10 mL/hr at 09/19/21 1310   DAPTOmycin (CUBICIN) 900 mg in sodium chloride 0.9 % IVPB 900 mg (10/07/21 2119)   methocarbamol (ROBAXIN) IV      alosetron  1 mg Oral BID   amitriptyline  25 mg Oral QHS   amLODipine  10 mg Oral Q24H   atorvastatin  40 mg Oral q1800   carvedilol  6.25 mg Oral BID   cyclobenzaprine  10 mg Oral TID   darbepoetin (ARANESP) injection - DIALYSIS  200 mcg Subcutaneous Q Fri-HD   docusate sodium  100 mg Oral BID   DULoxetine  60 mg Oral Daily   famotidine  20 mg Oral Daily   furosemide  80 mg Oral Daily   heparin  5,000 Units Subcutaneous Q8H   hydrALAZINE  50 mg Oral TID   insulin aspart  0-6 Units Subcutaneous TID WC   insulin aspart  5 Units Subcutaneous TID WC   insulin glargine-yfgn  12 Units Subcutaneous QHS   multivitamin  1 tablet Oral QHS   pantoprazole  80 mg Oral Daily   sevelamer carbonate  1,600 mg Oral TID WC   sodium chloride flush  3 mL  Intravenous Q12H    Dialysis Orders: MWF at [place], 400/500, EDW 90kg, 2K/2Ca, LUE AVF, heparin 4000 unit bolus   Assessment/Plan: 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 Back to OR 08/28/21, 08/31/21 for L hip debridement and wound vac placement (now removed). Will finish 6 week course of IV Daptomycin on 10/12/21. ESRD: Continue HD on MWF schedule Next HD 8/9.  Intermittent AVF cannulation issues - fine recently. Will continue to monitor. Increasing heparin with HD as tolerated. Will continue to encourage to sit in chair for HD although this is not her biggest obstacle for discharge. Please see # [phone].   AVF Access: Issues with clots, cannulation. S/p branch ligation in OR 09/19/2021 per Dr. Virl Cagey.  HTN/ volume: BP controlled. On amlodipine/hydralazine/Coreg/Lasix. Now slightly under prior EDW. Still with some edema, UF as tolerated and has been reminded of importance of following fluid restrictions.  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 Secondary HPTH: CCa/Phos ok. Continue Renvela as binder. No VDRA for now. DM2: Insulin per primary Dispo: Patient has issues with no handicap ramp at her home. OP HD clinic cannot help her get in and out of her car when she arrives to HD. SNF recommended.  Last admission she was supposed to go to SNF but on day of discharge, she refused SNF placement. Very difficult situation.  Not clear what chance of success she will have as OP regardless of her ability to sit in chair if her home situation is not improved.  Have reached out to renal MSW.    Lynnda Child PA-C [facility] 10/08/2021,10:15 AM

## 2021-10-08 NOTE — Progress Notes (Signed)
Occupational Therapy Treatment Patient Details Name: [name] MRN: [id number] DOB: April 24, 1965 Today's Date: 10/08/2021   History of present illness 56 y.o. female presented to ED 6/26 from dialysis with increased bloody drainage from L hip wound. 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 s/p hardware removal of left hip with partial resection of tuft tissue and femure that were nonviable on 08/10/21 Discharged home 6/23. underwent L hip debridement and wound vac placement 6/28; +Left intertrochanteric non-union,  Fracture of left distal femur  PMH: hypertension, hyperlipidemia, ESRD on HD MWF, history of left BKA in 2021, depression/anxiety, stroke, tobacco use, T2DM,  insomnia, chronic pain syndrome,   OT comments  COTA met with patient with OTR to discuss goals and treatment plan. Patient able to get to EOB with increased time and no physical assistance. Patient attempted to address doffing sock and was unable to reach without pain at LLE hip.  Will address AE in later sessions. Patient seen with PT to address slide board transfers and sit to stands. Patient required assistance for setup and max assist to use slide board from EOB to drop arm recliner. Patient attempted to stand from recliner with +2 assistance but was unable to stand with patient stating it was too low. Patient was max assist to return to EOB with slide board and performed 3 stands with mod to max assist of 2 with patient tolerating +60 seconds for each stand. Patient is making gains towards transfers and will continue to be seen by acute OT.    Recommendations for follow up therapy are one component of a multi-disciplinary discharge planning process, led by the attending physician.  Recommendations may be updated based on patient status, additional functional criteria and insurance authorization.    Follow Up Recommendations  Skilled nursing-short term rehab (<3 hours/day)    Assistance  Recommended at Discharge Intermittent Supervision/Assistance  Patient can return home with the following  A lot of help with bathing/dressing/bathroom;Assistance with cooking/housework;Assist for transportation;Help with stairs or ramp for entrance;Two people to help with walking and/or transfers   Equipment Recommendations  Other (comment) (drop arm BSC)    Recommendations for Other Services      Precautions / Restrictions Precautions Precautions: Fall;Other (comment) Precaution Comments: L BKA (baseline) Restrictions Weight Bearing Restrictions: Yes LLE Weight Bearing: Non weight bearing Other Position/Activity Restrictions: L distal femur fx--non union       Mobility Bed Mobility Overal bed mobility: Needs Assistance Bed Mobility: Supine to Sit, Sit to Supine     Supine to sit: Supervision Sit to supine: Supervision   General bed mobility comments: requires increased time but is able to get to EOB and back to supine    Transfers Overall transfer level: Needs assistance Equipment used: Rolling walker (2 wheels), Sliding board Transfers: Sit to/from Stand, Bed to chair/wheelchair/BSC Sit to Stand: Mod assist, +2 physical assistance, Max assist          Lateral/Scoot Transfers: Max assist General transfer comment: Performed transfer from EOB to drop arm recliner with sliding board. Attempted to stand from recliner but was unable to transferred back to EOB. Performed 3 stands from EOB with mod to max assist of 2 and tolerated 60 + seconds each stand     Balance Overall balance assessment: Needs assistance Sitting-balance support: Feet supported Sitting balance-Leahy Scale: Good Sitting balance - Comments: no assistance with sitting balance   Standing balance support: Bilateral upper extremity supported, During functional activity, Reliant on assistive device for  balance Standing balance-Leahy Scale: Poor Standing balance comment: performed 3 stands from EOB and  tolerated 60+ seconds                           ADL either performed or assessed with clinical judgement   ADL Overall ADL's : Needs assistance/impaired                         Toilet Transfer: [employer] manager Details (indicate cue type and reason): simulated to drop arm recliner           General ADL Comments: agreeable to slide board transfer    Extremity/Trunk Assessment              Vision       Perception     Praxis      Cognition Arousal/Alertness: Awake/alert Behavior During Therapy: WFL for tasks assessed/performed Overall Cognitive Status: Within Functional Limits for tasks assessed Area of Impairment: Safety/judgement                               General Comments: agreeable to sliding board transfer and possible discharge to SNF for more rehab        Exercises      Shoulder Instructions       General Comments      Pertinent Vitals/ Pain       Pain Assessment Pain Assessment: Faces Faces Pain Scale: Hurts little more Pain Location: rt knee Pain Descriptors / Indicators: Aching, Discomfort, Grimacing, Guarding Pain Intervention(s): Monitored during session, Repositioned, Premedicated before session  Home Living                                          Prior Functioning/Environment              Frequency  Min 2X/week        Progress Toward Goals  OT Goals(current goals can now be found in the care plan section)  Progress towards OT goals: Progressing toward goals  Acute Rehab OT Goals Patient Stated Goal: increase independence with transers OT Goal Formulation: With patient Time For Goal Achievement: 10/08/21 Potential to Achieve Goals: Fair ADL Goals Pt Will Perform Grooming: with modified independence;sitting Pt Will Perform Lower Body Bathing: with min assist;sitting/lateral leans Pt Will Perform Lower Body Dressing: with min  assist;sitting/lateral leans Pt Will Transfer to Toilet: bedside commode;with transfer board;with supervision Pt Will Perform Toileting - Clothing Manipulation and hygiene: with min assist;sitting/lateral leans Pt/caregiver will Perform Home Exercise Program: Increased strength;Both right and left upper extremity;With theraband;Independently;With written HEP provided Additional ADL Goal #1: Pt will perform bed mobility modified independently in preparation for ADLs with HOB flat.  Plan Discharge plan remains appropriate;Frequency remains appropriate    Co-evaluation    PT/OT/SLP Co-Evaluation/Treatment: Yes Reason for Co-Treatment: For patient/therapist safety;To address functional/ADL transfers   OT goals addressed during session: ADL's and self-care      AM-PAC OT "6 Clicks" Daily Activity     Outcome Measure   Help from another person eating meals?: None Help from another person taking care of personal grooming?: A Little Help from another person toileting, which includes using toliet, bedpan, or urinal?: Total Help from another person bathing (including washing, rinsing, drying)?: A Lot  Help from another person to put on and taking off regular upper body clothing?: A Little Help from another person to put on and taking off regular lower body clothing?: A Lot 6 Click Score: 15    End of Session Equipment Utilized During Treatment: Gait belt;Rolling walker (2 wheels);Other (comment) (slide board)  OT Visit Diagnosis: Muscle weakness (generalized) (M62.81);Unsteadiness on feet (R26.81);History of falling (Z91.81)   Activity Tolerance Patient tolerated treatment well   Patient Left in bed;with call bell/phone within reach;with bed alarm set   Nurse Communication Mobility status        Time: 9735-3299 OT Time Calculation (min): 52 min  Charges: OT General Charges $OT Visit: 1 Visit OT Treatments $Therapeutic Activity: 8-22 mins  [name], OTA Acute Rehabilitation  Services  Office [place] 10/08/2021, 1:59 PM

## 2021-10-08 NOTE — Plan of Care (Signed)
  Problem: Pain Managment: Goal: General experience of comfort will improve Outcome: Progressing   

## 2021-10-08 NOTE — Progress Notes (Signed)
Physical Therapy Treatment Patient Details Name: [name] MRN: [id number] DOB: [birth year] Today's Date: 10/08/2021   History of Present Illness 56 y.o. female presented to ED 6/26 from dialysis with increased bloody drainage from L hip wound. 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 s/p hardware removal of left hip with partial resection of tuft tissue and femure that were nonviable on 08/10/21 Discharged home 6/23. underwent L hip debridement and wound vac placement 6/28; +Left intertrochanteric non-union,  Fracture of left distal femur  PMH: hypertension, hyperlipidemia, ESRD on HD MWF, history of left BKA in 2021, depression/anxiety, stroke, tobacco use, T2DM,  insomnia, chronic pain syndrome,    PT Comments    Patient agreed to sliding board transfer to recliner and completed with max assist. Attempted standing from recliner with blue air cushion in seat, but pt was not able to come to stand and reported it felt like she was too low. Sliding board transfer back to bed and repeated sit to stand from elevated bed (~4") x 3 reps with +2 mod assist x 60+seconds each time. Will continue to work on standing from bed at lowest height and sliding board transfers in/out of chair.     Recommendations for follow up therapy are one component of a multi-disciplinary discharge planning process, led by the attending physician.  Recommendations may be updated based on patient status, additional functional criteria and insurance authorization.  Follow Up Recommendations  Skilled nursing-short term rehab (<3 hours/day) Can patient physically be transported by private vehicle: No   Assistance Recommended at Discharge Intermittent Supervision/Assistance  Patient can return home with the following A lot of help with bathing/dressing/bathroom;Assistance with cooking/housework;Assist for transportation;Two people to help with walking and/or transfers;Help with stairs or ramp  for entrance   Equipment Recommendations  Other (comment) (Seems well-equipped; agree with looking into medical [name] transport for HD)    Recommendations for Other Services       Precautions / Restrictions Precautions Precautions: Fall;Other (comment) Precaution Comments: L BKA (baseline) Restrictions Weight Bearing Restrictions: Yes LLE Weight Bearing: Non weight bearing Other Position/Activity Restrictions: L distal femur fx--non union     Mobility  Bed Mobility Overal bed mobility: Needs Assistance Bed Mobility: Supine to Sit, Sit to Supine     Supine to sit: Supervision Sit to supine: Supervision   General bed mobility comments: requires increased time but is able to get to EOB and back to supine    Transfers Overall transfer level: Needs assistance Equipment used: Rolling walker (2 wheels), Sliding board Transfers: Sit to/from Stand, Bed to chair/wheelchair/BSC Sit to Stand: Mod assist, +2 physical assistance, Max assist          Lateral/Scoot Transfers: Max assist General transfer comment: Performed transfer from EOB to drop arm recliner with sliding board. Attempted to stand from recliner but was unable to transferred back to EOB. Performed 3 stands from EOB with mod to max assist of 2 and tolerated 60 + seconds each stand    Ambulation/Gait               General Gait Details: attempted to "hop" backwards but unable to offload RLE via UEs on RW; attempted several times   Stairs             Wheelchair Mobility    Modified Rankin (Stroke Patients Only)       Balance Overall balance assessment: Needs assistance Sitting-balance support: Feet supported Sitting balance-Leahy Scale: Good Sitting balance - Comments: no  assistance with sitting balance   Standing balance support: Bilateral upper extremity supported, During functional activity, Reliant on assistive device for balance Standing balance-Leahy Scale: Poor Standing balance comment:  performed 3 stands from EOB and tolerated 60+ seconds                            Cognition Arousal/Alertness: Awake/alert Behavior During Therapy: WFL for tasks assessed/performed Overall Cognitive Status: Within Functional Limits for tasks assessed Area of Impairment: Safety/judgement                         Safety/Judgement: Decreased awareness of deficits     General Comments: agreeable to sliding board transfer and possible discharge to SNF for more rehab        Exercises General Exercises - Lower Extremity Long Arc Quad: AROM, Right, 20 reps, Seated    General Comments        Pertinent Vitals/Pain Pain Assessment Pain Assessment: Faces Faces Pain Scale: Hurts even more Pain Location: rt knee Pain Descriptors / Indicators: Aching, Discomfort, Grimacing, Guarding Pain Intervention(s): Limited activity within patient's tolerance, Monitored during session, Premedicated before session, Repositioned    Home Living Family/patient expects to be discharged to:: Private residence Living Arrangements: Children Available Help at Discharge: Family;Available PRN/intermittently Type of Home: Mobile home Home Access: Ramped entrance       Home Layout: One level Home Equipment: Wheelchair - manual;BSC/3in1;Rollator (4 wheels)      Prior Function            PT Goals (current goals can now be found in the care plan section) Acute Rehab PT Goals Patient Stated Goal: less pain with movement PT Goal Formulation: With patient Time For Goal Achievement: 10/10/21 Potential to Achieve Goals: Fair Progress towards PT goals: Progressing toward goals    Frequency    Min 2X/week      PT Plan Current plan remains appropriate;Frequency needs to be updated    Co-evaluation PT/OT/SLP Co-Evaluation/Treatment: Yes Reason for Co-Treatment: Complexity of the patient's impairments (multi-system involvement);For patient/therapist safety;To address  functional/ADL transfers PT goals addressed during session: Mobility/safety with mobility;Balance;Proper use of DME;Strengthening/ROM OT goals addressed during session: ADL's and self-care      AM-PAC PT "6 Clicks" Mobility   Outcome Measure  Help needed turning from your back to your side while in a flat bed without using bedrails?: None Help needed moving from lying on your back to sitting on the side of a flat bed without using bedrails?: A Little Help needed moving to and from a bed to a chair (including a wheelchair)?: Total Help needed standing up from a chair using your arms (e.g., wheelchair or bedside chair)?: Total Help needed to walk in hospital room?: Total Help needed climbing 3-5 steps with a railing? : Total 6 Click Score: 11    End of Session Equipment Utilized During Treatment: Gait belt Activity Tolerance: Patient limited by pain Patient left: in bed;with call bell/phone within reach Nurse Communication: Mobility status PT Visit Diagnosis: Other abnormalities of gait and mobility (R26.89);Muscle weakness (generalized) (M62.81);History of falling (Z91.81);Pain Pain - Right/Left: Left Pain - part of body: Leg     Time: 7782-4235 PT Time Calculation (min) (ACUTE ONLY): 39 min  Charges:  $Gait Training: 8-22 mins                      Arby Barrette, PT Acute Rehabilitation Services  Office [phone]    [name] 10/08/2021, 2:50 PM

## 2021-10-08 NOTE — Progress Notes (Signed)
Occupational Therapy Treatment Patient Details Name: [name] MRN: [id number] DOB: August 03, 1965 Today's Date: 10/08/2021   History of present illness 56 y.o. female presented to ED 6/26 from dialysis with increased bloody drainage from L hip wound. 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 s/p hardware removal of left hip with partial resection of tuft tissue and femure that were nonviable on 08/10/21 Discharged home 6/23. underwent L hip debridement and wound vac placement 6/28; +Left intertrochanteric non-union,  Fracture of left distal femur  PMH: hypertension, hyperlipidemia, ESRD on HD MWF, history of left BKA in 2021, depression/anxiety, stroke, tobacco use, T2DM,  insomnia, chronic pain syndrome,   OT comments  Pt seen at beginning of session with COTA to discuss pt's progress with goals. Pt able to complete bed mobility with supervision, however reports difficulty with LB ADLs and transfers. Pt discussed need to be able to transfer with and without transfer board, and is more open to SNF level rehab before returning home. Pt's goals updated as appropriate, pt continues to present with impairments listed below, will follow. Continue to recommend SNF level rehab at d/c.   Recommendations for follow up therapy are one component of a multi-disciplinary discharge planning process, led by the attending physician.  Recommendations may be updated based on patient status, additional functional criteria and insurance authorization.    Follow Up Recommendations  Skilled nursing-short term rehab (<3 hours/day)    Assistance Recommended at Discharge Intermittent Supervision/Assistance  Patient can return home with the following  A lot of help with bathing/dressing/bathroom;Assistance with cooking/housework;Assist for transportation;Help with stairs or ramp for entrance;Two people to help with walking and/or transfers   Equipment Recommendations  Other (comment)     Recommendations for Other Services PT consult    Precautions / Restrictions Precautions Precautions: Fall;Other (comment) Precaution Comments: L BKA (baseline) Restrictions Weight Bearing Restrictions: Yes LLE Weight Bearing: Non weight bearing Other Position/Activity Restrictions: L distal femur fx--non union       Mobility Bed Mobility Overal bed mobility: Needs Assistance Bed Mobility: Supine to Sit, Sit to Supine     Supine to sit: Supervision Sit to supine: Supervision   General bed mobility comments: requires increased time but is able to get to EOB and back to supine    Transfers                         Balance Overall balance assessment: Needs assistance Sitting-balance support: Feet supported Sitting balance-Leahy Scale: Good Sitting balance - Comments: no assistance with sitting balance                                   ADL either performed or assessed with clinical judgement   ADL Overall ADL's : Needs assistance/impaired                     Lower Body Dressing: Maximal assistance;Sitting/lateral leans Lower Body Dressing Details (indicate cue type and reason): attempts to don sock                    Extremity/Trunk Assessment Upper Extremity Assessment Upper Extremity Assessment: Overall WFL for tasks assessed   Lower Extremity Assessment Lower Extremity Assessment: Defer to PT evaluation RLE Deficits / Details: hip, knee and ankle ROM WFL, hip flex 4/5, knee flex 4/5, knee ext 3+/5, ankle dorsi/plantarflex 4/5 LLE Deficits /  Details: s/p BKA (2021). tendency to rest in external rotation and adduction, unable to flex/extend L knee due to fx   Cervical / Trunk Assessment Cervical / Trunk Assessment: Normal    Vision   Vision Assessment?: No apparent visual deficits   Perception Perception Perception: Not tested   Praxis Praxis Praxis: Not tested    Cognition Arousal/Alertness: Awake/alert Behavior  During Therapy: WFL for tasks assessed/performed Overall Cognitive Status: Within Functional Limits for tasks assessed Area of Impairment: Safety/judgement                         Safety/Judgement: Decreased awareness of deficits     General Comments: agreeable to sliding board transfer and possible discharge to SNF for more rehab        Exercises      Shoulder Instructions       General Comments      Pertinent Vitals/ Pain       Pain Assessment Pain Assessment: Faces Pain Score: 5  Faces Pain Scale: Hurts even more Pain Location: rt knee Pain Descriptors / Indicators: Aching, Discomfort, Grimacing, Guarding Pain Intervention(s): Limited activity within patient's tolerance  Home Living Family/patient expects to be discharged to:: Private residence Living Arrangements: Children Available Help at Discharge: Family;Available PRN/intermittently Type of Home: Mobile home Home Access: Ramped entrance     Home Layout: One level     Bathroom Shower/Tub: Occupational psychologist: Standard Bathroom Accessibility: No   Home Equipment: Tax adviser (4 wheels)          Prior Functioning/Environment              Frequency  Min 2X/week        Progress Toward Goals  OT Goals(current goals can now be found in the care plan section)  Progress towards OT goals: Progressing toward goals  Acute Rehab OT Goals Patient Stated Goal: increase independence OT Goal Formulation: With patient Time For Goal Achievement: 10/22/21 Potential to Achieve Goals: Fair ADL Goals Pt Will Perform Lower Body Bathing: with supervision;sitting/lateral leans;with adaptive equipment Pt Will Perform Lower Body Dressing: with adaptive equipment;sitting/lateral leans;with supervision Pt Will Transfer to Toilet: with min assist;with +2 assist;bedside commode;with transfer board Pt/caregiver will Perform Home Exercise Program: Increased  ROM;Increased strength;Both right and left upper extremity;With written HEP provided;Independently;With theraband  Plan Discharge plan remains appropriate;Frequency remains appropriate    Co-evaluation      Reason for Co-Treatment: Complexity of the patient's impairments (multi-system involvement);For patient/therapist safety;To address functional/ADL transfers PT goals addressed during session: Mobility/safety with mobility;Balance;Proper use of DME;Strengthening/ROM        AM-PAC OT "6 Clicks" Daily Activity     Outcome Measure   Help from another person eating meals?: None Help from another person taking care of personal grooming?: A Little Help from another person toileting, which includes using toliet, bedpan, or urinal?: Total Help from another person bathing (including washing, rinsing, drying)?: A Lot Help from another person to put on and taking off regular upper body clothing?: A Little Help from another person to put on and taking off regular lower body clothing?: A Lot 6 Click Score: 15    End of Session    OT Visit Diagnosis: Muscle weakness (generalized) (M62.81);Unsteadiness on feet (R26.81);History of falling (Z91.81)   Activity Tolerance Patient tolerated treatment well   Patient Left  (with COTA sitting EOB)   Nurse Communication Mobility status        Time:  5183-4373 OT Time Calculation (min): 12 min  Charges: OT General Charges $OT Visit: 1 Visit OT Treatments $Therapeutic Activity: 8-22 mins  Lynnda Child, OTD, OTR/L Acute Rehab [phone]) [phone] [place] 10/08/2021, 5:03 PM

## 2021-10-08 NOTE — Progress Notes (Signed)
PROGRESS NOTE    [name]  ZOX:[id number] DOB: [birth year] DOA: 08/26/2021 PCP: [name], MD   Brief Narrative:  [name] was admitted to the hospital with the working diagnosis of subacute osteomyelitis left intertrochanteric fracture.   56 year old woman PMH including ESRD, complicated left hip and femur issues requiring multiple surgeries, removal of hardware presented 6/26 with bloody drainage from surgical incision.  Seen by orthopedics and underwent I&D 6/28 and 7/1.  Currently unable to progress care secondary to refusal to sit in chair for HD.    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  Active Problems:   ESRD (end stage renal disease) on dialysis (HCC)   Type 2 diabetes mellitus with hyperlipidemia (HCC)   History of CVA (cerebrovascular accident)   Essential hypertension   Anxiety and depression   GASTROESOPHAGEAL REFLUX, NO ESOPHAGITIS   TOBACCO DEPENDENCE   Wound infection   Wound dehiscence, surgical, sequela  Assessment and Plan:   Subacute osteomyelitis of the left femur with abscess, infected left hip hardware status post removal, left intertrochanteric fracture --Wound VAC removed on 8/4 by Ortho, continue wound care, cleared by Dr. [name] for discharge; follow-up with orthopedics as an outpatient --per ID daptomycin for 6 weeks with dialysis, stop date 8/12. Follow-up with Dr. Candiss Norse, ID and the [facility] -Patient reports that she has significant drainage from the surgical site.  This may be due to significant anasarca/subcutaneous edema but have requested Dr. [name] to kindly evaluate.   ESRD --Dialysis MWF.  Patient needs to be able to sit in chair to tolerate outpatient dialysis for discharge --Nephrology managing --s/p fistula revision and branch ligation. -Cont to not tolerate HD. Discussed with Nephrology - recommendations for SNF as pt would not be safe for home d/c (no ramp to get into  home) -Cont fluid restriction, concerns were raised regarding compliance to fluid intake -HD 8/7, brief run of 2 hours, reported machine problems, asked to be taken off, underwent laying down in bed.  Patient stated that she is still not ready to have HD on chair for several complaints, pain, fluid, weakness etc. -Remains on Lasix 80 mg orally daily. -Next HD 8/9.  Had a very long discussion with patient and encouraged her to try HD sitting on chair.  She seemed reluctantly agreeable.  As per nephrology follow-up, patient has issues with no handicap ramp at her home and the outpatient HD clinic cannot help her get in and out of her car when she arrives to HD.  They indicate that it is not clear what chance of success she will have as outpatient regardless of her ability to sit in chair if her home situation is not improved.  Anasarca: Secondary to ESRD and volume overload.  Volume management across HD.  Still remains significantly volume overloaded.  Most recent TTE in 2019 had an LVEF of 60 to 65%.  Requested updated TTE.  I see patients concern regarding significant edema and have communicated same with nephrology team.  Hyponatremia Suspect due to renal disease and volume.  Management per nephrology.  Hyperkalemia Potassium 5.5 on 8/7, management across HD.  May repeat BMP at HD on 8/9.    Anemia of chronic disease/CKD and, component of acute blood loss anemia from wound --Status post 2 unit packed red blood cells 6/29 --Hemoglobin stable in the 9-10 range. -Continue Aranesp per nephrology.   Uncontrolled type 2 diabetes with hypoglycemia --A1c 7.1 in 07/2021. --CBG  stable. Continue Semglee, NovoLog, sliding scale. CBG stable  Essential hypertension --stable. Continue amlodipine, Coreg, hydralazine and Lasix.   Hyperlipidemia --continue Lipitor  History of CVA Continue statins.  Anxiety and depression: Continue Cymbalta and Elavil  GERD, no esophagitis Continue  Dexilant  Tobacco dependence Cessation counseled, declines patch.     DVT prophylaxis: heparin subq Code Status: Full Family Communication: None at bedside   Status is: Inpatient Remains inpatient appropriate because: Severity of illness   Consultants:  Nephrology Orthopedic Surgery    Antimicrobials:  Anti-infectives (From admission, onward)    Start     Dose/Rate Route Frequency Ordered Stop   10/02/21 2000  DAPTOmycin (CUBICIN) 900 mg in sodium chloride 0.9 % IVPB        10 mg/kg  92.4 kg 136 mL/hr over 30 Minutes Intravenous Once per day on Mon Wed Fri 10/01/21 1350     09/30/21 2000  DAPTOmycin (CUBICIN) 900 mg in sodium chloride 0.9 % IVPB  Status:  Discontinued        10 mg/kg  92.4 kg 136 mL/hr over 30 Minutes Intravenous Daily 09/28/21 0919 10/01/21 1350   09/28/21 2000  DAPTOmycin (CUBICIN) 900 mg in sodium chloride 0.9 % IVPB        900 mg 136 mL/hr over 30 Minutes Intravenous  Once 09/28/21 0919 09/28/21 2146   09/02/21 2000  DAPTOmycin (CUBICIN) 900 mg in sodium chloride 0.9 % IVPB  Status:  Discontinued        10 mg/kg  92.4 kg 136 mL/hr over 30 Minutes Intravenous Once per day on Mon Wed Fri 09/02/21 0836 09/28/21 0919   08/31/21 0745  ceFAZolin (ANCEF) IVPB 2g/100 mL premix        2 g 200 mL/hr over 30 Minutes Intravenous To Short Stay 08/31/21 0730 08/31/21 0918   08/30/21 0600  ceFAZolin (ANCEF) IVPB 2g/100 mL premix  Status:  Discontinued        2 g 200 mL/hr over 30 Minutes Intravenous To Short Stay 08/30/21 0123 08/31/21 0600   08/29/21 2000  DAPTOmycin (CUBICIN) 900 mg in sodium chloride 0.9 % IVPB  Status:  Discontinued        10 mg/kg  92.4 kg 136 mL/hr over 30 Minutes Intravenous Every 48 hours 08/29/21 1627 09/02/21 0836   08/28/21 1800  ceFAZolin (ANCEF) IVPB 2g/100 mL premix  Status:  Discontinued        2 g 200 mL/hr over 30 Minutes Intravenous Every M-W-F (1800) 08/27/21 0846 08/29/21 1620   08/28/21 1200  ceFAZolin (ANCEF) IVPB 1 g/50  mL premix  Status:  Discontinued        1 g 100 mL/hr over 30 Minutes Intravenous Every M-W-F (Hemodialysis) 08/26/21 1954 08/27/21 0846   08/28/21 0600  ceFAZolin (ANCEF) IVPB 2g/100 mL premix        2 g 200 mL/hr over 30 Minutes Intravenous On call to O.R. 08/27/21 1150 08/28/21 0909   08/27/21 1800  ceFAZolin (ANCEF) IVPB 1 g/50 mL premix        1 g 100 mL/hr over 30 Minutes Intravenous  Once 08/26/21 1954 08/27/21 2048   08/26/21 1500  ceFEPIme (MAXIPIME) 1 g in sodium chloride 0.9 % 100 mL IVPB  Status:  Discontinued        1 g 200 mL/hr over 30 Minutes Intravenous Every 24 hours 08/26/21 1452 08/26/21 1714       Subjective: No new complaints.  Ongoing concerns that she is unable to set up for HD  due to significant fluid in her right flank, left BKA stump, oozing from her left hip surgical site and dialysis taking "6-8 hours" and she cannot sit up that long.  Objective: Vitals:   10/07/21 1151 10/07/21 2120 10/08/21 0508 10/08/21 0951  BP: 138/83 (!) 131/54 (!) 126/54 (!) 141/50  Pulse: 62 60 (!) 58 63  Resp: 15 18 20 18   Temp: 97.7 F (36.5 C) 98.9 F (37.2 C) 98.3 F (36.8 C) 98.2 F (36.8 C)  TempSrc: Oral Oral Oral   SpO2: 98% 100% 100% 97%  Weight: 91 kg     Height:        Intake/Output Summary (Last 24 hours) at 10/08/2021 1504 Last data filed at 10/08/2021 1407 Gross per 24 hour  Intake 848 ml  Output 800 ml  Net 48 ml   Filed Weights   10/03/21 1957 10/04/21 0828 10/07/21 1151  Weight: 89.8 kg 89.1 kg 91 kg    Examination:  General exam: Young female, moderately built and nourished lying comfortably propped up in bed without distress. Respiratory system: Clear to auscultation.  No increased work of breathing. Cardiovascular system: S1 and S2 heard, RRR.  No JVD, murmurs or pedal edema.  Not on telemetry. Gastrointestinal system: Abdomen is nondistended, soft and nontender.  No organomegaly or masses appreciated.  Normal bowel sounds heard.  Significant  pitting edema around her pelvis, right flank/hip, left hip extending down to the BKA stump. Central nervous system: Alert and oriented.  No focal neurological deficits. Extremities: Left hip surgical site dressing mildly soiled but dry. Left BKA stump seems to have healed well.  Right leg with no edema, and has a TED hose. Psychiatry: Appropriate affect.    Data Reviewed: I have personally reviewed following labs and imaging studies  CBC: Recent Labs  Lab 10/02/21 0659 10/02/21 0808 10/04/21 0820 10/07/21 0912  WBC 6.8 6.5 6.8 9.2  HGB 9.6* 9.6* 9.8* 10.3*  HCT 30.9* 30.1* 31.5* 33.1*  MCV 92.2 93.2 92.4 91.7  PLT 194 179 272 098   Basic Metabolic Panel: Recent Labs  Lab 10/02/21 0350 10/04/21 0820 10/07/21 0913  NA 129* 130* 127*  K 5.1 4.5 5.5*  CL 96* 94* 91*  CO2 24 27 25   GLUCOSE 186* 162* 131*  BUN 47* 48* 62*  CREATININE 5.33* 5.72* 6.80*  CALCIUM 8.5* 8.9 9.0  PHOS  --  4.8* 5.7*   GFR: Estimated Creatinine Clearance: 11.6 mL/min (A) (by C-G formula based on SCr of 6.8 mg/dL (H)). Liver Function Tests: Recent Labs  Lab 10/02/21 0350 10/04/21 0820 10/07/21 0913  AST 20  --   --   ALT 14  --   --   ALKPHOS 112  --   --   BILITOT 0.5  --   --   PROT 6.6  --   --   ALBUMIN 2.5* 2.5* 2.7*    Cardiac Enzymes: Recent Labs  Lab 10/03/21 0435  CKTOTAL 21*    CBG: Recent Labs  Lab 10/07/21 1313 10/07/21 1620 10/07/21 2121 10/08/21 0728 10/08/21 1127  GLUCAP 141* 176* 131* 142* 128*     No results found for this or any previous visit (from the past 240 hour(s)).       Radiology Studies: No results found.      Scheduled Meds:  alosetron  1 mg Oral BID   amitriptyline  25 mg Oral QHS   amLODipine  10 mg Oral Q24H   atorvastatin  40 mg Oral q1800  carvedilol  6.25 mg Oral BID   Chlorhexidine Gluconate Cloth  6 each Topical Q0600   cyclobenzaprine  10 mg Oral TID   darbepoetin (ARANESP) injection - DIALYSIS  200 mcg Subcutaneous  Q Fri-HD   docusate sodium  100 mg Oral BID   DULoxetine  60 mg Oral Daily   famotidine  20 mg Oral Daily   furosemide  80 mg Oral Daily   heparin  5,000 Units Subcutaneous Q8H   hydrALAZINE  50 mg Oral TID   insulin aspart  0-6 Units Subcutaneous TID WC   insulin aspart  5 Units Subcutaneous TID WC   insulin glargine-yfgn  12 Units Subcutaneous QHS   multivitamin  1 tablet Oral QHS   pantoprazole  80 mg Oral Daily   sevelamer carbonate  1,600 mg Oral TID WC   sodium chloride flush  3 mL Intravenous Q12H   Continuous Infusions:  sodium chloride     sodium chloride 10 mL/hr at 09/19/21 1310   DAPTOmycin (CUBICIN) 900 mg in sodium chloride 0.9 % IVPB 900 mg (10/07/21 2119)   methocarbamol (ROBAXIN) IV       LOS: 71 days       Vernell Leep, MD,  FACP, [facility], [facility], [facility] (Care Management Physician Certified) [place]  To contact the attending provider between 7A-7P or the covering provider during after hours 7P-7A, please log into the web site [email or web address] and access using universal [place] password for that web site. If you do not have the password, please call the hospital operator.

## 2021-10-09 ENCOUNTER — Inpatient Hospital Stay (HOSPITAL_COMMUNITY): Payer: Medicare Other

## 2021-10-09 DIAGNOSIS — R601 Generalized edema: Secondary | ICD-10-CM

## 2021-10-09 DIAGNOSIS — F419 Anxiety disorder, unspecified: Secondary | ICD-10-CM | POA: Diagnosis not present

## 2021-10-09 DIAGNOSIS — N186 End stage renal disease: Secondary | ICD-10-CM | POA: Diagnosis not present

## 2021-10-09 DIAGNOSIS — I10 Essential (primary) hypertension: Secondary | ICD-10-CM | POA: Diagnosis not present

## 2021-10-09 DIAGNOSIS — M86252 Subacute osteomyelitis, left femur: Secondary | ICD-10-CM | POA: Diagnosis not present

## 2021-10-09 LAB — ECHOCARDIOGRAM COMPLETE
Area-P 1/2: 2.31 cm2
Height: 70 in
S' Lateral: 3.3 cm
Weight: 3209.9 oz

## 2021-10-09 LAB — GLUCOSE, CAPILLARY
Glucose-Capillary: 117 mg/dL — ABNORMAL HIGH (ref 70–99)
Glucose-Capillary: 126 mg/dL — ABNORMAL HIGH (ref 70–99)
Glucose-Capillary: 243 mg/dL — ABNORMAL HIGH (ref 70–99)

## 2021-10-09 MED ORDER — PERFLUTREN LIPID MICROSPHERE
1.0000 mL | INTRAVENOUS | Status: AC | PRN
  Administered 2021-10-09: 2 mL via INTRAVENOUS

## 2021-10-09 MED ORDER — PENTAFLUOROPROP-TETRAFLUOROETH EX AERO
INHALATION_SPRAY | CUTANEOUS | Status: AC
  Filled 2021-10-09: qty 30

## 2021-10-09 NOTE — Progress Notes (Addendum)
Received patient in bed to unit.  Alert and oriented.  Informed consent signed and in  chart.   Treatment initiated:1331 Treatment completed: 5409  Patient tolerated well.  Transported back to the room  alert, without acute distress.  Hand-off given to patient's nurse.   Access used: fistula right arm  Access issues: difficult cannulation  Total UF removed: 1 liter Medication(s) given:none Post HD VS: 123/56 HR 63 Sat 100% room air RR 14 Temp oral 98.8 Post HD weight: 88.7 kg   [facility]

## 2021-10-09 NOTE — Progress Notes (Signed)
Progress Note   Patient: [name] [id number]:[id number] DOB: Jun 10, 1965 DOA: 08/26/2021     44 DOS: the patient was seen and examined on 10/09/2021   Brief hospital course: Mrs. Nobles was admitted to the hospital with the working diagnosis of subacute osteomyelitis left intertrochanteric fracture.   56 year old woman PMH including ESRD, complicated left hip and femur issues requiring multiple surgeries, removal of hardware presented 6/26 with bloody drainage from surgical incision.  Seen by orthopedics and underwent I&D 6/28 and 7/1.  Currently unable to progress care secondary to refusal to sit in chair for HD.  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 [name] for discharge; follow-up with orthopedics as an outpatient --per ID daptomycin for 6 weeks with dialysis, stop date 8/12. Follow-up with Dr. Candiss Norse, ID and the [place] [facility] -Patient reports that she has significant drainage from the surgical site.  This may be due to significant anasarca/subcutaneous edema. Dr. Algis Liming had requested Dr. [name] to kindly evaluate, pending input   ESRD --Dialysis MWF.  Patient needs to be able to sit in chair to tolerate outpatient dialysis for discharge --Nephrology managing --s/p fistula revision and branch ligation. -Cont to not tolerate HD. Discussed with Nephrology - recommendations for SNF as pt would not be safe for home d/c (no ramp to get into home) -Cont fluid restriction, concerns were raised regarding compliance to fluid intake -HD 8/7, brief run of 2 hours, reported machine problems, asked to be taken off, underwent laying down in bed.  Patient stated that she is still not ready to have HD on chair for several complaints, pain, fluid, weakness etc. -Remains on Lasix 80 mg orally daily. -Next HD 8/9.  Had a very  long discussion with patient and encouraged her to try HD sitting on chair.  She seemed reluctantly agreeable.  As per nephrology follow-up, patient has issues with no handicap ramp at her home and the outpatient HD clinic cannot help her get in and out of her car when she arrives to HD.  They indicate that it is not clear what chance of success she will have as outpatient regardless of her ability to sit in chair if her home situation is not improved.   Anasarca: Secondary to ESRD and volume overload.  Volume management across HD.  Still remains significantly volume overloaded.  Most recent TTE in 2019 had an LVEF of 60 to 65%.  Requested updated TTE.  I see patients concern regarding significant edema and have communicated same with nephrology team.   Hyponatremia Suspect due to renal disease and volume.  Management per nephrology.  Hyperkalemia  management across HD.     Anemia of chronic disease/CKD and, component of acute blood loss anemia from wound --Status post 2 unit packed red blood cells 6/29 --Hemoglobin stable in the 9-10 range. -Continue Aranesp per nephrology.   Uncontrolled type 2 diabetes with hypoglycemia --A1c 7.1 in 07/2021. --CBG stable. Continue Semglee, NovoLog, sliding scale. CBG stable  Essential hypertension --stable. Continue amlodipine, Coreg, hydralazine and Lasix.   Hyperlipidemia --continue Lipitor   History of CVA Continue statins.   Anxiety and depression: Continue Cymbalta and Elavil  GERD, no esophagitis Continue Dexilant  Tobacco dependence Cessation counseled, declines patch.       Subjective: Complains of LE tenderness and swelling still  Physical Exam: Vitals:   10/09/21 1600 10/09/21  1630 10/09/21 1700 10/09/21 1730  BP: (!) 107/54 (!) 119/53 (!) 105/55 (!) 109/52  Pulse: 65 64 63 63  Resp: 17 11 12 11   Temp:      TempSrc:      SpO2: 100% 100% 100% 100%  Weight:      Height:       General exam: Awake, laying in bed, in  nad Respiratory system: Normal respiratory effort, no wheezing Cardiovascular system: regular rate, s1, s2 Gastrointestinal system: Soft, nondistended, positive BS Central nervous system: CN2-12 grossly intact, strength intact Extremities: Perfused, no clubbing Skin: Normal skin turgor, no notable skin lesions seen Psychiatry: Mood normal // no visual hallucinations   Data Reviewed:  There are no new results to review at this time.  Family Communication: Pt in room, family not at bedside  Disposition: Status is: Inpatient Remains inpatient appropriate because: Severity of illness  Planned Discharge Destination: Home    Author: Marylu Lund, MD 10/09/2021 7:37 PM  For on call review [email or web address].

## 2021-10-09 NOTE — Progress Notes (Signed)
[facility] Progress Note   Subjective:  Seen in room. Declined to sit in recliner for dialysis today. No new complaints.    Objective Vitals:   10/08/21 0951 10/08/21 1641 10/08/21 2107 10/09/21 0515  BP: (!) 141/50 (!) 131/54 106/82 (!) 144/78  Pulse: 63 (!) 58 60 61  Resp: 18 19 18 18   Temp: 98.2 F (36.8 C) 98.9 F (37.2 C) 98.6 F (37 C) 98.2 F (36.8 C)  TempSrc:  Oral  Oral  SpO2: 97% 100% 99% 98%  Weight:      Height:       Physical Exam General: Well appearing woman, NAD. Room air. 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 Dialysis Access: LUE AVF + bruit  Additional Objective Labs: Basic Metabolic Panel: Recent Labs  Lab 10/04/21 0820 10/07/21 0913  NA 130* 127*  K 4.5 5.5*  CL 94* 91*  CO2 27 25  GLUCOSE 162* 131*  BUN 48* 62*  CREATININE 5.72* 6.80*  CALCIUM 8.9 9.0  PHOS 4.8* 5.7*    Liver Function Tests: Recent Labs  Lab 10/04/21 0820 10/07/21 0913  ALBUMIN 2.5* 2.7*    CBC: Recent Labs  Lab 10/04/21 0820 10/07/21 0912  WBC 6.8 9.2  HGB 9.8* 10.3*  HCT 31.5* 33.1*  MCV 92.4 91.7  PLT 272 352    Blood Culture    Component Value Date/Time   SDES TISSUE 08/28/2021 0917   SPECREQUEST LEFT THIGH TISSUE 08/28/2021 0917   CULT  08/28/2021 0917    MODERATE ENTEROCOCCUS FAECIUM VANCOMYCIN RESISTANT ENTEROCOCCUS ISOLATED NO ANAEROBES ISOLATED Sent to [facility] for further susceptibility testing. Performed at [facility], [place] [address] [place], [place] [address]    REPTSTATUS 09/02/2021 FINAL 08/28/2021 5093   Medications:  sodium chloride     sodium chloride 10 mL/hr at 09/19/21 1310   DAPTOmycin (CUBICIN) 900 mg in sodium chloride 0.9 % IVPB 900 mg (10/07/21 2119)   methocarbamol (ROBAXIN) IV      alosetron  1 mg Oral BID   amitriptyline  25 mg Oral QHS   amLODipine  10 mg Oral Q24H   atorvastatin  40 mg Oral q1800    carvedilol  6.25 mg Oral BID   cyclobenzaprine  10 mg Oral TID   darbepoetin (ARANESP) injection - DIALYSIS  200 mcg Subcutaneous Q Fri-HD   docusate sodium  100 mg Oral BID   DULoxetine  60 mg Oral Daily   famotidine  20 mg Oral Daily   furosemide  80 mg Oral Daily   heparin  5,000 Units Subcutaneous Q8H   hydrALAZINE  50 mg Oral TID   insulin aspart  0-6 Units Subcutaneous TID WC   insulin aspart  5 Units Subcutaneous TID WC   insulin glargine-yfgn  12 Units Subcutaneous QHS   multivitamin  1 tablet Oral QHS   pantoprazole  80 mg Oral Daily   sevelamer carbonate  1,600 mg Oral TID WC   sodium chloride flush  3 mL Intravenous Q12H    Dialysis Orders: MWF at [facility], 400/500, EDW 90kg, 2K/2Ca, LUE AVF, heparin 4000 unit bolus   Assessment/Plan: 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 Back to OR 08/28/21, 08/31/21 for L hip debridement and wound vac placement (now removed). Will finish 6 week course of IV Daptomycin on 10/12/21. ESRD: Continue HD on MWF schedule -HD today  Intermittent AVF cannulation issues -  fine recently. Will continue to monitor. Increasing heparin with HD as tolerated. Will continue to encourage to sit in chair for HD although this is not her biggest obstacle for discharge. Please see # 8.   AVF Access: Issues with clots, cannulation. S/p branch ligation in OR 09/19/2021 per Dr. Virl Cagey.  HTN/ volume: BP controlled. On amlodipine/hydralazine/Coreg/Lasix. Now slightly under prior EDW. Still with some edema, UF as tolerated and has been reminded of importance of following fluid restrictions.  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 Secondary HPTH: CCa/Phos ok. Continue Renvela as binder. No VDRA for now. DM2: Insulin per primary Dispo: Patient has issues with no handicap ramp at her home. OP HD clinic cannot help her get in and out of her car when she arrives to HD. SNF recommended.  Last admission she was supposed  to go to SNF but on day of discharge, she refused SNF placement. Very difficult situation.  Not clear what chance of success she will have as OP regardless of her ability to sit in chair if her home situation is not improved.     Lynnda Child PA-C [facility] 10/09/2021,10:14 AM

## 2021-10-09 NOTE — TOC Progression Note (Signed)
Transition of Care [facility]) - Initial/Assessment Note    Patient Details  Name: [name] MRN: [id number] Date of Birth: December 21, 1965  [facility] CM/SW Contact:    Milinda Antis, [place] Phone Number: 10/09/2021, 9:15 AM  Clinical Narrative:                 TOC continues to follow patient for discharge planning.    Pending: patient's ability to sit in a chair to receive dialysis treatment.    Expected Discharge Plan: Skilled Nursing Facility Barriers to Discharge: Continued Medical Work up   Patient Goals and CMS Choice Patient states their goals for this hospitalization and ongoing recovery are:: To return home CMS Medicare.gov Compare Post Acute Care list provided to:: Patient Choice offered to / list presented to : NA  Expected Discharge Plan and Services Expected Discharge Plan: [place]   Discharge Planning Services: CM Consult   Living arrangements for the past 2 months: Single Family Home                                      Prior Living Arrangements/Services Living arrangements for the past 2 months: Single Family Home Lives with:: Adult Children Patient language and need for interpreter reviewed:: Yes Do you feel safe going back to the place where you live?: Yes      Need for Family Participation in Patient Care: Yes (Comment) Care giver support system in place?: Yes (comment) Current home services: DME ([employer], w/c) Criminal Activity/Legal Involvement Pertinent to Current Situation/Hospitalization: No - Comment as needed  Activities of Daily Living Home Assistive Devices/Equipment: Eyeglasses, Wheelchair ADL Screening (condition at time of admission) Patient's cognitive ability adequate to safely complete daily activities?: Yes Is the patient deaf or have difficulty hearing?: No Does the patient have difficulty seeing, even when wearing glasses/contacts?: No Does the patient have difficulty concentrating,  remembering, or making decisions?: No Patient able to express need for assistance with ADLs?: Yes Does the patient have difficulty dressing or bathing?: Yes Independently performs ADLs?: No Communication: Independent Dressing (OT): Independent Is this a change from baseline?: Pre-admission baseline Grooming: Independent Feeding: Independent Bathing: Needs assistance Is this a change from baseline?: Pre-admission baseline Toileting: Needs assistance Is this a change from baseline?: Pre-admission baseline In/Out Bed: Needs assistance, Dependent Is this a change from baseline?: Pre-admission baseline Walks in Home: Dependent Is this a change from baseline?: Pre-admission baseline Does the patient have difficulty walking or climbing stairs?: Yes Weakness of Legs: Both Weakness of Arms/Hands: Both  Permission Sought/Granted Permission sought to share information with : Case Manager, Family Supports Permission granted to share information with : Yes, Verbal Permission Granted              Emotional Assessment Appearance:: Appears stated age Attitude/Demeanor/Rapport: Engaged, Gracious Affect (typically observed): Accepting, Appropriate, Calm, Hopeful Orientation: : Oriented to Self, Oriented to Place, Oriented to  Time, Oriented to Situation Alcohol / Substance Use: Not Applicable Psych Involvement: No (comment)  Admission diagnosis:  Wound infection [T14.8XXA, L08.9] Abscess of left hip [L02.416] Patient Active Problem List   Diagnosis Date Noted   Wound dehiscence, surgical, sequela    Abscess of left hip 08/26/2021   History of CVA (cerebrovascular accident) 52/77/8242   Hardware complicating wound infection ([place])    Subacute osteomyelitis of left femur with abscess     Septic arthritis of hip ([place]) 07/30/2021  Skin abscess L hip 07/28/2021   HCAP (healthcare-associated pneumonia) 07/27/2021   Hyperkalemia 40/81/4481   Metabolic acidemia 85/63/1497   Closed fracture  of left distal femur ([place]) 07/22/2021   Type 2 diabetes mellitus with hyperlipidemia ([place]) 07/22/2021   Infection of superficial incisional surgical site after procedure 03/09/2020   Abscess    Hypoglycemia    Essential hypertension    Sleep disturbance    ESRD (end stage renal disease) on dialysis ([place]) 01/24/2020   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[place]) 01/23/2020   Hyponatremia    Constipation    Chronic osteomyelitis involving left ankle and foot ([place])    Ulcer of left foot with necrosis of bone ([place])    Wound infection 12/28/2019   History of [place] amputation of left foot ([place]) 12/25/2017   History of CVA (cerebrovascular accident) without residual deficits 07/04/2017   Cerebral thrombosis with cerebral infarction 04/08/2017   Right sided weakness 04/07/2017   Hyperhidrosis 09/01/2016   Migraine with aura and without status migrainosus, not intractable 07/28/2016   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[place]) 08/04/2014   CKD stage 3 due to type 2 diabetes mellitus ([place]) 06/27/2014   Vitamin D insufficiency 05/08/2014   Obesity (BMI 30.0-34.9) 05/08/2014   Unilateral amputation of left foot ([place]) 05/08/2014   Bursitis of left shoulder 02/14/2014   Neck pain 01/03/2014   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.23) 01/14/2013   Insomnia 08/04/2012   Diabetic neuropathy, painful ([place]) 08/04/2011   Anxiety and depression 05/16/2010   Female stress incontinence 11/01/2007   TOBACCO DEPENDENCE 04/30/2006   GASTROESOPHAGEAL REFLUX, NO ESOPHAGITIS 04/30/2006   Irritable bowel syndrome 04/30/2006   PCP:  [name], MD Pharmacy:   [facility], [place] [address] [place] [address] Phone: [phone] Fax: [phone]  [id number] - [place], [place] [address] [place] [address] Phone:  [phone] Fax: [phone]     Social Determinants of Health (SDOH) Interventions    Readmission Risk Interventions    07/26/2021    3:15 PM  Readmission Risk Prevention Plan  Transportation Screening Complete  PCP or Specialist Appt within 3-5 Days Complete  HRI or Home Care Consult Complete  Palliative Care Screening Not Applicable  Medication Review (RN Care Manager) Referral to Pharmacy

## 2021-10-10 DIAGNOSIS — Z992 Dependence on renal dialysis: Secondary | ICD-10-CM | POA: Diagnosis not present

## 2021-10-10 DIAGNOSIS — I10 Essential (primary) hypertension: Secondary | ICD-10-CM | POA: Diagnosis not present

## 2021-10-10 DIAGNOSIS — N186 End stage renal disease: Secondary | ICD-10-CM | POA: Diagnosis not present

## 2021-10-10 DIAGNOSIS — M86252 Subacute osteomyelitis, left femur: Secondary | ICD-10-CM | POA: Diagnosis not present

## 2021-10-10 LAB — GLUCOSE, CAPILLARY
Glucose-Capillary: 170 mg/dL — ABNORMAL HIGH (ref 70–99)
Glucose-Capillary: 164 mg/dL — ABNORMAL HIGH (ref 70–99)
Glucose-Capillary: 150 mg/dL — ABNORMAL HIGH (ref 70–99)

## 2021-10-10 LAB — CK: Total CK: 18 U/L — ABNORMAL LOW (ref 38–234)

## 2021-10-10 MED ORDER — CHLORHEXIDINE GLUCONATE CLOTH 2 % EX PADS
6.0000 | MEDICATED_PAD | Freq: Every day | CUTANEOUS | Status: DC
  Administered 2021-10-10: 6 via TOPICAL

## 2021-10-10 NOTE — Progress Notes (Signed)
Patient ID: [name], female   DOB: 03-17-1965, 56 y.o.   MRN: [id number] Patient is status post removal of deep retained hardware left hip with chronic osteomyelitis of the femur.  There is clear serous drainage from a small open wound.  Recommended continue dry dressing changes.  Will get patient set up for lymphedema pumps as an outpatient to to help decrease the lymphatic drainage.  Not able to get the lymphedema pumps as an inpatient.

## 2021-10-10 NOTE — Progress Notes (Signed)
[facility] Progress Note   Subjective:  Completed dialysis yesterday. UF 3.5L. She reports no issues with HD yesterday.    Objective Vitals:   10/09/21 2040 10/10/21 0520 10/10/21 0850 10/10/21 0900  BP: (!) 126/56 (!) 134/56 (!) 137/59 (!) 143/54  Pulse: 70 61 66 62  Resp:  16 16 16   Temp: 98 F (36.7 C) 97.8 F (36.6 C) 98.1 F (36.7 C) 97.9 F (36.6 C)  TempSrc: Oral Oral Oral Oral  SpO2: 97% 100% 97% 99%  Weight:      Height:       Physical Exam General: Well appearing woman, NAD. Room air. Heart: RRR; no murmur Lungs: CTA anteriorly, no wheezing Abdomen: soft + abd wall edema  Extremities: 1+ RLE edema; L BKA/dressing on L hip Dialysis Access: LUE AVF + bruit  Additional Objective Labs: Basic Metabolic Panel: Recent Labs  Lab 10/04/21 0820 10/07/21 0913  NA 130* 127*  K 4.5 5.5*  CL 94* 91*  CO2 27 25  GLUCOSE 162* 131*  BUN 48* 62*  CREATININE 5.72* 6.80*  CALCIUM 8.9 9.0  PHOS 4.8* 5.7*    Liver Function Tests: Recent Labs  Lab 10/04/21 0820 10/07/21 0913  ALBUMIN 2.5* 2.7*    CBC: Recent Labs  Lab 10/04/21 0820 10/07/21 0912  WBC 6.8 9.2  HGB 9.8* 10.3*  HCT 31.5* 33.1*  MCV 92.4 91.7  PLT 272 352    Blood Culture    Component Value Date/Time   SDES TISSUE 08/28/2021 0917   SPECREQUEST LEFT THIGH TISSUE 08/28/2021 0917   CULT  08/28/2021 0917    MODERATE ENTEROCOCCUS FAECIUM VANCOMYCIN RESISTANT ENTEROCOCCUS ISOLATED NO ANAEROBES ISOLATED Sent to [facility] for further susceptibility testing. Performed at [facility], [place] [address] [place], [place] [address]    REPTSTATUS 09/02/2021 FINAL 08/28/2021 1914   Medications:  sodium chloride     sodium chloride 10 mL/hr at 09/19/21 1310   DAPTOmycin (CUBICIN) 900 mg in sodium chloride 0.9 % IVPB 900 mg (10/09/21 2039)   methocarbamol (ROBAXIN) IV      alosetron  1 mg Oral BID   amitriptyline  25 mg Oral QHS   amLODipine  10 mg Oral Q24H   atorvastatin   40 mg Oral q1800   carvedilol  6.25 mg Oral BID   cyclobenzaprine  10 mg Oral TID   darbepoetin (ARANESP) injection - DIALYSIS  200 mcg Subcutaneous Q Fri-HD   docusate sodium  100 mg Oral BID   DULoxetine  60 mg Oral Daily   famotidine  20 mg Oral Daily   furosemide  80 mg Oral Daily   heparin  5,000 Units Subcutaneous Q8H   hydrALAZINE  50 mg Oral TID   insulin aspart  0-6 Units Subcutaneous TID WC   insulin aspart  5 Units Subcutaneous TID WC   insulin glargine-yfgn  12 Units Subcutaneous QHS   multivitamin  1 tablet Oral QHS   pantoprazole  80 mg Oral Daily   sevelamer carbonate  1,600 mg Oral TID WC   sodium chloride flush  3 mL Intravenous Q12H    Dialysis Orders: MWF at [facility], 400/500, EDW 90kg, 2K/2Ca, LUE AVF, heparin 4000 unit bolus   Assessment/Plan: 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 Back to OR 08/28/21, 08/31/21 for L hip debridement and wound vac placement (now removed). Will finish 6 week course of IV Daptomycin on 10/12/21. ESRD: Continue HD on MWF schedule -HD 8/11.  Intermittent  AVF cannulation issues - fine recently. Will continue to monitor. Increasing heparin with HD as tolerated. Will continue to encourage to sit in chair for HD. Please see # 8.   AVF Access: S/p branch ligation in OR 09/19/2021 per Dr. Virl Cagey.  HTN/ volume: BP controlled. On amlodipine/hydralazine/Coreg/Lasix. Still with edema. Weights climbing back up. She signed off early on Monday.  Max UF as tolerated and has been reminded of importance of following fluid restrictions.  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 Secondary HPTH: CCa/Phos ok. Continue Renvela as binder. No VDRA for now. DM2: Insulin per primary Dispo: Patient has issues with no handicap ramp at her home. OP HD clinic cannot help her get in and out of her car when she arrives to HD. SNF recommended.  Last admission she was supposed to go to SNF but on day of discharge,  she refused SNF placement. Very difficult situation.  Not clear what chance of success she will have as OP regardless of her ability to sit in chair if her home situation is not improved.     Lynnda Child PA-C [facility] 10/10/2021,10:01 AM

## 2021-10-10 NOTE — Progress Notes (Signed)
Physical Therapy Treatment Patient Details Name: [name] MRN: [id number] DOB: 07/16/1965 Today's Date: 10/10/2021   History of Present Illness 56 y.o. female presented to ED 6/26 from dialysis with increased bloody drainage from L hip wound. 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 s/p hardware removal of left hip with partial resection of tuft tissue and femure that were nonviable on 08/10/21 Discharged home 6/23. underwent L hip debridement and wound vac placement 6/28; +Left intertrochanteric non-union,  Fracture of left distal femur  PMH: hypertension, hyperlipidemia, ESRD on HD MWF, history of left BKA in 2021, depression/anxiety, stroke, tobacco use, T2DM,  insomnia, chronic pain syndrome,    PT Comments    Patient given permission (via Dr. [name]) to now full weight-bear on LLE. Unable to don prosthesis on L BKA due to edema in LLE. Educated on techniques/exercises to help decr edema and to have daughter bring her shrinker. Patient was able to stand x 4 reps (3 with the stedy, although pt did not put any weight through distal LLE). More difficult standing with stedy and pt felt insecure on seat of stedy (felt she was sliding down towards her foot). Was unable to advance Rt foot at all with standing with RW. Will also continue to work on sliding board transfers.     Recommendations for follow up therapy are one component of a multi-disciplinary discharge planning process, led by the attending physician.  Recommendations may be updated based on patient status, additional functional criteria and insurance authorization.  Follow Up Recommendations  Skilled nursing-short term rehab (<3 hours/day) Can patient physically be transported by private vehicle: No   Assistance Recommended at Discharge Intermittent Supervision/Assistance  Patient can return home with the following A lot of help with bathing/dressing/bathroom;Assistance with cooking/housework;Assist  for transportation;Two people to help with walking and/or transfers;Help with stairs or ramp for entrance   Equipment Recommendations  Other (comment) (Seems well-equipped; agree with looking into medical [name] transport for HD)    Recommendations for Other Services OT consult     Precautions / Restrictions Precautions Precautions: Fall;Other (comment) Precaution Comments: L BKA (baseline) Restrictions Weight Bearing Restrictions: No Other Position/Activity Restrictions: L distal femur fx; per Dr [name] 8/10 pt can full WB     Mobility  Bed Mobility Overal bed mobility: Needs Assistance Bed Mobility: Supine to Sit, Sit to Supine   Sidelying to sit: HOB elevated, Supervision   Sit to supine: Supervision   General bed mobility comments: requires increased time and effort but is able to get to EOB and back to supine    Transfers Overall transfer level: Needs assistance Equipment used: Rolling walker (2 wheels), Ambulation equipment used Transfers: Sit to/from Stand Sit to Stand: +2 physical assistance, Max assist           General transfer comment: STood x 1 with RW with pt thinking she could pivot to chair with rW (unable once standing); received word pt could be full WB on LLE per Dr. [name] and attempted stedy with pt standing x 3 with +2 max assist; stood for 45-55 seconds at a time; sat on seat of stedy x 1 with reports of incr bil knee pain and felt she was sliding off seat therefore did not pivot to recliner    Ambulation/Gait             Pre-gait activities: pt remains unable to advance RLE in standing due to weakness and rt knee pain     Stairs  Wheelchair Mobility    Modified Rankin (Stroke Patients Only)       Balance Overall balance assessment: Needs assistance Sitting-balance support: Feet supported Sitting balance-Leahy Scale: Good Sitting balance - Comments: no assistance with sitting balance   Standing balance support:  Bilateral upper extremity supported, During functional activity, Reliant on assistive device for balance Standing balance-Leahy Scale: Poor Standing balance comment: performed 4 stands from EOB and stedy tolerated 45+ seconds                            Cognition Arousal/Alertness: Awake/alert Behavior During Therapy: WFL for tasks assessed/performed Overall Cognitive Status: Within Functional Limits for tasks assessed                                          Exercises General Exercises - Lower Extremity Long Arc Quad: AROM, Right, 20 reps, Seated Other Exercises Other Exercises: LLE internal rotation in supine and sitting (cannot get to neutral)    General Comments General comments (skin integrity, edema, etc.): Once told she could be full WB on LLE, attempted to don silicone sleeve for donning LBKA prosthesis. Only able to get sleeve unrolled up to pt's patellar tendon and was too tight to unroll further. Encouraged pt to have her daughter bring in her stump shrinker and to work on elevating LLE for edema management.      Pertinent Vitals/Pain Pain Assessment Pain Assessment: Faces Faces Pain Scale: Hurts even more Pain Location: rt knee>lt knee Pain Descriptors / Indicators: Aching, Discomfort, Grimacing, Guarding Pain Intervention(s): Limited activity within patient's tolerance, Monitored during session, Repositioned    Home Living                          Prior Function            PT Goals (current goals can now be found in the care plan section) Acute Rehab PT Goals Patient Stated Goal: less pain with movement PT Goal Formulation: With patient Time For Goal Achievement: 10/24/21 Potential to Achieve Goals: Fair Progress towards PT goals: Not progressing toward goals - comment (goals updated based on timeframe)    Frequency    Min 2X/week      PT Plan Current plan remains appropriate;Frequency needs to be updated     Co-evaluation PT/OT/SLP Co-Evaluation/Treatment: Yes Reason for Co-Treatment: For patient/therapist safety;To address functional/ADL transfers;Other (comment) (pt tolerance/participation) PT goals addressed during session: Mobility/safety with mobility;Balance;Proper use of DME;Strengthening/ROM        AM-PAC PT "6 Clicks" Mobility   Outcome Measure  Help needed turning from your back to your side while in a flat bed without using bedrails?: None Help needed moving from lying on your back to sitting on the side of a flat bed without using bedrails?: A Little Help needed moving to and from a bed to a chair (including a wheelchair)?: Total Help needed standing up from a chair using your arms (e.g., wheelchair or bedside chair)?: Total Help needed to walk in hospital room?: Total Help needed climbing 3-5 steps with a railing? : Total 6 Click Score: 11    End of Session Equipment Utilized During Treatment: Gait belt Activity Tolerance: Patient limited by pain Patient left: in bed;with call bell/phone within reach   PT Visit Diagnosis: Other abnormalities of gait and mobility (R26.89);Muscle  weakness (generalized) (M62.81);History of falling (Z91.81);Pain Pain - Right/Left: Left Pain - part of body: Leg     Time: 8413-2440 PT Time Calculation (min) (ACUTE ONLY): 48 min  Charges:  $Gait Training: 8-22 mins $Therapeutic Exercise: 8-22 mins                      Arby Barrette, PT [place]  Office [phone]    [name] 10/10/2021, 1:58 PM

## 2021-10-10 NOTE — Progress Notes (Signed)
Progress Note   Patient: [name] FTD:[id number] DOB: [birth year] DOA: 08/26/2021     45 DOS: the patient was seen and examined on 10/10/2021   Brief hospital course: [name] was admitted to the hospital with the working diagnosis of subacute osteomyelitis left intertrochanteric fracture.   56 year old woman PMH including ESRD, complicated left hip and femur issues requiring multiple surgeries, removal of hardware presented 6/26 with bloody drainage from surgical incision.  Seen by orthopedics and underwent I&D 6/28 and 7/1.  Currently unable to progress care secondary to refusal to sit in chair for HD.  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 [name] for discharge; follow-up with orthopedics as an outpatient --per ID daptomycin for 6 weeks with dialysis, stop date 8/12. Follow-up with Dr. Candiss Norse, ID and the [facility] -Patient reports that she has significant drainage from the surgical site.  This may be due to significant anasarca/subcutaneous edema. Seen by Dr. [name] who recommends lymphedema pump as outpatient   ESRD --Dialysis MWF.  Patient needs to be able to sit in chair to tolerate outpatient dialysis for discharge --Nephrology managing --s/p fistula revision and branch ligation. -Cont to not tolerate HD. Discussed with Nephrology - recommendations for SNF as pt would not be safe for home d/c (no ramp to get into home) -Cont fluid restriction, concerns were raised regarding compliance to fluid intake -HD 8/7, brief run of 2 hours, reported machine problems, asked to be taken off, underwent laying down in bed.  Patient stated that she is still not ready to have HD on chair for several complaints, pain, fluid, weakness etc. -Remains on Lasix 80 mg orally daily. -Next HD 8/9.  Had a very long  discussion with patient and encouraged her to try HD sitting on chair.  She seemed reluctantly agreeable.  As per nephrology follow-up, patient has issues with no handicap ramp at her home and the outpatient HD clinic cannot help her get in and out of her car when she arrives to HD.  They indicate that it is not clear what chance of success she will have as outpatient regardless of her ability to sit in chair if her home situation is not improved.   Anasarca: Secondary to ESRD and volume overload.  Volume management across HD.  Still remains significantly volume overloaded.  Most recent TTE in 2019 had an LVEF of 60 to 65%.  Requested updated TTE.  I see patients concern regarding significant edema and have communicated same with nephrology team.   Hyponatremia Suspect due to renal disease and volume.  Management per nephrology.  Hyperkalemia  management across HD.     Anemia of chronic disease/CKD and, component of acute blood loss anemia from wound --Status post 2 unit packed red blood cells 6/29 --Hemoglobin stable in the 9-10 range. -Continue Aranesp per nephrology.   Uncontrolled type 2 diabetes with hypoglycemia --A1c 7.1 in 07/2021. --CBG stable. Continue Semglee, NovoLog, sliding scale. CBG stable  Essential hypertension --stable. Continue amlodipine, Coreg, hydralazine and Lasix.   Hyperlipidemia --continue Lipitor   History of CVA Continue statins.   Anxiety and depression: Continue Cymbalta and Elavil  GERD, no esophagitis Continue Dexilant  Tobacco dependence Cessation counseled, declines patch.       Subjective: States RLE swelling is improving, still with L sided swelling  Physical Exam: Vitals:   10/09/21  2040 10/10/21 0520 10/10/21 0850 10/10/21 0900  BP: (!) 126/56 (!) 134/56 (!) 137/59 (!) 143/54  Pulse: 70 61 66 62  Resp:  16 16 16   Temp: 98 F (36.7 C) 97.8 F (36.6 C) 98.1 F (36.7 C) 97.9 F (36.6 C)  TempSrc: Oral Oral Oral Oral  SpO2: 97%  100% 97% 99%  Weight:      Height:       General exam: Conversant, in no acute distress Respiratory system: normal chest rise, clear, no audible wheezing Cardiovascular system: regular rhythm, s1-s2 Gastrointestinal system: Nondistended, nontender, pos BS Central nervous system: No seizures, no tremors Extremities: No cyanosis, no joint deformities Skin: No rashes, no pallor Psychiatry: Affect normal // no auditory hallucinations   Data Reviewed:  There are no new results to review at this time.  Family Communication: Pt in room, family not at bedside  Disposition: Status is: Inpatient Remains inpatient appropriate because: Severity of illness  Planned Discharge Destination: Home    Author: Marylu Lund, MD 10/10/2021 3:43 PM  For on call review [email or web address].

## 2021-10-10 NOTE — Plan of Care (Signed)
  Problem: Activity: Goal: Risk for activity intolerance will decrease Outcome: Progressing   Problem: Pain Managment: Goal: General experience of comfort will improve Outcome: Progressing   

## 2021-10-10 NOTE — Progress Notes (Signed)
Occupational Therapy Treatment Patient Details Name: AKEYLAH HENDEL MRN: [id number] DOB: Oct 24, 1965 Today's Date: 10/10/2021   History of present illness 56 y.o. female presented to ED 6/26 from dialysis with increased bloody drainage from L hip wound. 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 s/p hardware removal of left hip with partial resection of tuft tissue and femure that were nonviable on 08/10/21 Discharged home 6/23. underwent L hip debridement and wound vac placement 6/28; +Left intertrochanteric non-union,  Fracture of left distal femur  PMH: hypertension, hyperlipidemia, ESRD on HD MWF, history of left BKA in 2021, depression/anxiety, stroke, tobacco use, T2DM,  insomnia, chronic pain syndrome,   OT comments  Patient now FWB on LLE per Dr. [name]. Patient attempted to donn L prosthesis with max assist but unalb eto get sleeve completely on and patient to ask her daughter to brink her shrinker. Performed one stand with RW and 3 with Stedy.  Patient excepted to make more progress with current WB change. Acute OT to continue to follow.    Recommendations for follow up therapy are one component of a multi-disciplinary discharge planning process, led by the attending physician.  Recommendations may be updated based on patient status, additional functional criteria and insurance authorization.    Follow Up Recommendations  Skilled nursing-short term rehab (<3 hours/day)    Assistance Recommended at Discharge Intermittent Supervision/Assistance  Patient can return home with the following  A lot of help with bathing/dressing/bathroom;Assistance with cooking/housework;Assist for transportation;Help with stairs or ramp for entrance;Two people to help with walking and/or transfers   Equipment Recommendations  Other (comment) (TBD)    Recommendations for Other Services      Precautions / Restrictions Precautions Precautions: Fall;Other (comment) Precaution  Comments: L BKA (baseline) Restrictions Weight Bearing Restrictions: No Other Position/Activity Restrictions: L distal femur fx; per Dr [name] 8/10 pt can full WB       Mobility Bed Mobility Overal bed mobility: Needs Assistance Bed Mobility: Supine to Sit, Sit to Supine   Sidelying to sit: HOB elevated, Supervision   Sit to supine: Supervision   General bed mobility comments: increase time and effort to perform    Transfers Overall transfer level: Needs assistance Equipment used: Rolling walker (2 wheels), Ambulation equipment used Transfers: Sit to/from Stand Sit to Stand: +2 physical assistance, Max assist           General transfer comment: STood x 1 with RW with pt thinking she could pivot to chair with rW (unable once standing); received word pt could be full WB on LLE per Dr. [name] and attempted stedy with pt standing x 3 with +2 max assist; stood for 45-55 seconds at a time; sat on seat of stedy x 1 with reports of incr bil knee pain and felt she was sliding off seat therefore did not pivot to recliner     Balance Overall balance assessment: Needs assistance Sitting-balance support: Feet supported Sitting balance-Leahy Scale: Good Sitting balance - Comments: no assistance with sitting balance   Standing balance support: Bilateral upper extremity supported, During functional activity, Reliant on assistive device for balance Standing balance-Leahy Scale: Poor Standing balance comment: performed 4 stands from EOB and stedy tolerated 45+ seconds                           ADL either performed or assessed with clinical judgement   ADL Overall ADL's : Needs assistance/impaired  General ADL Comments: assisted with attempting to donn LLE prosthesis    Extremity/Trunk Assessment              Vision       Perception     Praxis      Cognition Arousal/Alertness: Awake/alert Behavior During  Therapy: WFL for tasks assessed/performed Overall Cognitive Status: Within Functional Limits for tasks assessed                                          Exercises      Shoulder Instructions       General Comments Once told she could be full WB on LLE, attempted to don silicone sleeve for donning LBKA prosthesis. Only able to get sleeve unrolled up to pt's patellar tendon and was too tight to unroll further. Encouraged pt to have her daughter bring in her stump shrinker and to work on elevating LLE for edema management.    Pertinent Vitals/ Pain       Pain Assessment Pain Assessment: Faces Faces Pain Scale: Hurts even more Pain Location: rt knee>lt knee Pain Descriptors / Indicators: Aching, Discomfort, Grimacing, Guarding Pain Intervention(s): Limited activity within patient's tolerance, Repositioned, Monitored during session  Home Living                                          Prior Functioning/Environment              Frequency  Min 2X/week        Progress Toward Goals  OT Goals(current goals can now be found in the care plan section)  Progress towards OT goals: Progressing toward goals  Acute Rehab OT Goals Patient Stated Goal: go home OT Goal Formulation: With patient Time For Goal Achievement: 10/22/21 Potential to Achieve Goals: Fair ADL Goals Pt Will Perform Grooming: with modified independence;sitting Pt Will Perform Lower Body Bathing: with supervision;sitting/lateral leans;with adaptive equipment Pt Will Perform Lower Body Dressing: with adaptive equipment;sitting/lateral leans;with supervision Pt Will Transfer to Toilet: with min assist;with +2 assist;bedside commode;with transfer board Pt Will Perform Toileting - Clothing Manipulation and hygiene: with min assist;sitting/lateral leans Pt/caregiver will Perform Home Exercise Program: Increased ROM;Increased strength;Both right and left upper extremity;With written  HEP provided;Independently;With theraband Additional ADL Goal #1: Pt will perform bed mobility modified independently in preparation for ADLs with HOB flat.  Plan Discharge plan remains appropriate;Frequency remains appropriate    Co-evaluation    PT/OT/SLP Co-Evaluation/Treatment: Yes Reason for Co-Treatment: For patient/therapist safety;To address functional/ADL transfers;Other (comment) (patient tolerance/participation) PT goals addressed during session: Strengthening/ROM        AM-PAC OT "6 Clicks" Daily Activity     Outcome Measure   Help from another person eating meals?: None Help from another person taking care of personal grooming?: A Little Help from another person toileting, which includes using toliet, bedpan, or urinal?: Total Help from another person bathing (including washing, rinsing, drying)?: A Lot Help from another person to put on and taking off regular upper body clothing?: A Little Help from another person to put on and taking off regular lower body clothing?: A Lot 6 Click Score: 15    End of Session Equipment Utilized During Treatment: Gait belt;Rolling walker (2 wheels);Other (comment) Charlaine Dalton)  OT Visit Diagnosis: Muscle weakness (generalized) (M62.81);Unsteadiness on  feet (R26.81);History of falling (Z91.81)   Activity Tolerance Patient tolerated treatment well   Patient Left in bed;with call bell/phone within reach;with bed alarm set   Nurse Communication Mobility status        Time: 4758-3074 OT Time Calculation (min): 48 min  Charges: OT General Charges $OT Visit: 1 Visit OT Treatments $Therapeutic Activity: 8-22 mins  [name], [place]  Office [phone]   [name] 10/10/2021, 2:28 PM

## 2021-10-11 DIAGNOSIS — I10 Essential (primary) hypertension: Secondary | ICD-10-CM | POA: Diagnosis not present

## 2021-10-11 DIAGNOSIS — M86252 Subacute osteomyelitis, left femur: Secondary | ICD-10-CM | POA: Diagnosis not present

## 2021-10-11 DIAGNOSIS — Z992 Dependence on renal dialysis: Secondary | ICD-10-CM | POA: Diagnosis not present

## 2021-10-11 DIAGNOSIS — N186 End stage renal disease: Secondary | ICD-10-CM | POA: Diagnosis not present

## 2021-10-11 LAB — CBC
HCT: 33.3 % — ABNORMAL LOW (ref 36.0–46.0)
Hemoglobin: 10.6 g/dL — ABNORMAL LOW (ref 12.0–15.0)
MCH: 29 pg (ref 26.0–34.0)
MCHC: 31.8 g/dL (ref 30.0–36.0)
MCV: 91 fL (ref 80.0–100.0)
Platelets: 266 10*3/uL (ref 150–400)
RBC: 3.66 MIL/uL — ABNORMAL LOW (ref 3.87–5.11)
RDW: 16.1 % — ABNORMAL HIGH (ref 11.5–15.5)
WBC: 8.6 10*3/uL (ref 4.0–10.5)
nRBC: 0 % (ref 0.0–0.2)

## 2021-10-11 LAB — RENAL FUNCTION PANEL
Albumin: 2.7 g/dL — ABNORMAL LOW (ref 3.5–5.0)
Anion gap: 9 (ref 5–15)
BUN: 53 mg/dL — ABNORMAL HIGH (ref 6–20)
CO2: 26 mmol/L (ref 22–32)
Calcium: 8.8 mg/dL — ABNORMAL LOW (ref 8.9–10.3)
Chloride: 92 mmol/L — ABNORMAL LOW (ref 98–111)
Creatinine, Ser: 5.94 mg/dL — ABNORMAL HIGH (ref 0.44–1.00)
GFR, Estimated: 8 mL/min — ABNORMAL LOW (ref >60)
Glucose, Bld: 120 mg/dL — ABNORMAL HIGH (ref 70–99)
Phosphorus: 6 mg/dL — ABNORMAL HIGH (ref 2.5–4.6)
Potassium: 5 mmol/L (ref 3.5–5.1)
Sodium: 127 mmol/L — ABNORMAL LOW (ref 135–145)

## 2021-10-11 LAB — GLUCOSE, CAPILLARY
Glucose-Capillary: 122 mg/dL — ABNORMAL HIGH (ref 70–99)
Glucose-Capillary: 159 mg/dL — ABNORMAL HIGH (ref 70–99)
Glucose-Capillary: 181 mg/dL — ABNORMAL HIGH (ref 70–99)
Glucose-Capillary: 127 mg/dL — ABNORMAL HIGH (ref 70–99)

## 2021-10-11 MED ORDER — HEPARIN SODIUM (PORCINE) 1000 UNIT/ML DIALYSIS
5000.0000 [IU] | Freq: Once | INTRAMUSCULAR | Status: AC
  Administered 2021-10-11: 5000 [IU] via INTRAVENOUS_CENTRAL
  Filled 2021-10-11: qty 5

## 2021-10-11 MED ORDER — DIPHENHYDRAMINE HCL 25 MG PO CAPS
25.0000 mg | ORAL_CAPSULE | Freq: Four times a day (QID) | ORAL | Status: AC | PRN
  Administered 2021-10-11: 25 mg via ORAL
  Filled 2021-10-11: qty 1

## 2021-10-11 MED ORDER — DARBEPOETIN ALFA 200 MCG/0.4ML IJ SOSY
200.0000 ug | PREFILLED_SYRINGE | INTRAMUSCULAR | Status: DC
  Administered 2021-10-11: 200 ug via INTRAVENOUS

## 2021-10-11 MED ORDER — PENTAFLUOROPROP-TETRAFLUOROETH EX AERO: 1.0000 | INHALATION_SPRAY | CUTANEOUS | Status: DC | PRN

## 2021-10-11 NOTE — Progress Notes (Signed)
Progress Note   Patient: [name] DOB: 11/26/1965 DOA: 08/26/2021     46 DOS: the patient was seen and examined on 10/11/2021   Brief hospital course: [name] was admitted to the hospital with the working diagnosis of subacute osteomyelitis left intertrochanteric fracture.   56 year old woman PMH including ESRD, complicated left hip and femur issues requiring multiple surgeries, removal of hardware presented 6/26 with bloody drainage from surgical incision.  Seen by orthopedics and underwent I&D 6/28 and 7/1.  Currently unable to progress care secondary to refusal to sit in chair for HD.  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 Sharol Martinez for discharge; follow-up with orthopedics as an outpatient --per ID daptomycin for 6 weeks with dialysis, stop date 8/12. Follow-up with Dr. Candiss Martinez, ID and the [name] -Patient reports that she has significant drainage from the surgical site.  This may be due to significant anasarca/subcutaneous edema. Seen by Dr. Sharol Martinez who recommends lymphedema pump as outpatient   ESRD --Dialysis MWF.  Patient needs to be able to sit in chair to tolerate outpatient dialysis for discharge --Nephrology managing --s/p fistula revision and branch ligation. -Cont to not tolerate HD. Discussed with Nephrology - recommendations for SNF as pt would not be safe for home d/c (no ramp to get into home) -Cont fluid restriction, concerns were raised regarding compliance to fluid intake -HD 8/7, brief run of 2 hours, reported machine problems, asked to be taken off, underwent laying down in bed.  Patient stated that she is still not ready to have HD on chair for several complaints, pain, fluid, weakness etc. -Remains on Lasix 80 mg orally daily. -Next HD 8/9.  Had a very long  discussion with patient and encouraged her to try HD sitting on chair.  She seemed reluctantly agreeable.  As per nephrology follow-up, patient has issues with no handicap ramp at her home and the outpatient [facility] cannot help her get in and out of her car when she arrives to HD.  They indicate that it is not clear what chance of success she will have as outpatient regardless of her ability to sit in chair if her home situation is not improved. -Seen on HD today   Anasarca: Secondary to ESRD and volume overload.  Volume management across HD.  Still remains significantly volume overloaded.  Most recent TTE in 2019 had an LVEF of 60 to 65%.  Requested updated TTE.  I see patients concern regarding significant edema and have communicated same with nephrology team.   Hyponatremia Suspect due to renal disease and volume.  Management per nephrology.  Hyperkalemia  management across HD.     Anemia of chronic disease/CKD and, component of acute blood loss anemia from wound --Status post 2 unit packed red blood cells 6/29 --Hemoglobin stable in the 9-10 range. -Continue Aranesp per nephrology.   Uncontrolled type 2 diabetes with hypoglycemia --A1c 7.1 in 07/2021. --CBG stable. Continue Semglee, NovoLog, sliding scale. CBG stable  Essential hypertension --stable. Continue amlodipine, Coreg, hydralazine and Lasix.   Hyperlipidemia --continue Lipitor   History of CVA Continue statins.   Anxiety and depression: Continue Cymbalta and Elavil  GERD, no esophagitis Continue Dexilant  Tobacco dependence Cessation counseled, declines patch.       Subjective: Reports overall swelling is improving  Physical Exam: Vitals:   10/11/21 1200  10/11/21 1230 10/11/21 1240 10/11/21 1311  BP: (!) 116/55 102/62 (!) 114/99 (!) 112/55  Pulse: 68 68 68 64  Resp: (!) 9 12 11 13   Temp:   98.4 F (36.9 C) 98.8 F (37.1 C)  TempSrc:   Oral Oral  SpO2: 98% 94% 94% 98%  Weight:   86.3 kg   Height:        General exam: Awake, laying in bed, in nad Respiratory system: Normal respiratory effort, no wheezing Cardiovascular system: regular rate, s1, s2 Gastrointestinal system: Soft, nondistended, positive BS Central nervous system: CN2-12 grossly intact, strength intact Extremities: Perfused, no clubbing Skin: Normal skin turgor, no notable skin lesions seen Psychiatry: Mood normal // no visual hallucinations   Data Reviewed:  Labs reviewed: Na 127, K 5.0, Cr 5.94  Family Communication: Pt in room, family not at bedside  Disposition: Status is: Inpatient Remains inpatient appropriate because: Severity of illness  Planned Discharge Destination: Home    Author: Marylu Lund, MD 10/11/2021 3:45 PM  For on call review [email or web address].

## 2021-10-11 NOTE — Progress Notes (Signed)
Received patient in bed to unit.  Alert and oriented.  Informed consent signed and in  chart.   Treatment initiated: 0810 Treatment completed: 1240  Patient tolerated well.  Transported back to the room  alert, without acute distress.  Hand-off given to patient's nurse.   Access used: AVF Access issues: none this tx  Total UF removed: 502ml Medication(s) given: Heparin 5,000 units bolus at the beginning of the tx, Aranep 200 mcg IVP, Benadryl 25 mg po, and Oxy 15mg  po Post HD VS:  98.4    114/99    68    11    94% RA Post HD weight: 86.3khg   [facility]

## 2021-10-11 NOTE — Progress Notes (Signed)
Patient taken to dialysis via transport at this time.  No s/s of distress noted.

## 2021-10-11 NOTE — Progress Notes (Signed)
[facility] Progress Note   Subjective:   Seen on HD, tolerating well. Reports edema in her R arm and L stump. Says she is not drinking much. Did not want to use a recliner for HD today because the excess fluid causes hip pain. Says she has been up and working with PT. Denies SOB, CP, dizziness, abdominal pain and nausea.   Objective Vitals:   10/11/21 0639 10/11/21 0755 10/11/21 0810 10/11/21 0830  BP: (!) 154/58 (!) 149/58 (!) 125/56 136/60  Pulse: 64  (!) 50 65  Resp: 18  15 10   Temp: 98.4 F (36.9 C) 99 F (37.2 C)    TempSrc: Oral Oral    SpO2: 100%  97% 97%  Weight:  92.3 kg    Height:       Physical Exam General: [name] Heart: RRR, no murmurs, rubs or gallops Lungs: CTA bilaterally without wheezing, rhonchi or rales Abdomen: Soft, non-distended, +BS. Trace dependent edema Extremities: 1+ edema in R arm and L stop Dialysis Access:  LUE AVF accessed  Additional Objective Labs: Basic Metabolic Panel: Recent Labs  Lab 10/07/21 0913 10/11/21 0416  NA 127* 127*  K 5.5* 5.0  CL 91* 92*  CO2 25 26  GLUCOSE 131* 120*  BUN 62* 53*  CREATININE 6.80* 5.94*  CALCIUM 9.0 8.8*  PHOS 5.7* 6.0*   Liver Function Tests: Recent Labs  Lab 10/07/21 0913 10/11/21 0416  ALBUMIN 2.7* 2.7*   No results for input(s): "LIPASE", "AMYLASE" in the last 168 hours. CBC: Recent Labs  Lab 10/07/21 0912 10/11/21 0416  WBC 9.2 8.6  HGB 10.3* 10.6*  HCT 33.1* 33.3*  MCV 91.7 91.0  PLT 352 266   Blood Culture    Component Value Date/Time   SDES TISSUE 08/28/2021 0917   SPECREQUEST LEFT THIGH TISSUE 08/28/2021 0917   CULT  08/28/2021 0917    MODERATE ENTEROCOCCUS FAECIUM VANCOMYCIN RESISTANT ENTEROCOCCUS ISOLATED NO ANAEROBES ISOLATED Sent to [place] for further susceptibility testing. Performed at [facility], [place] [address] [place], [place] [address]    REPTSTATUS 09/02/2021 FINAL 08/28/2021 0917    Cardiac Enzymes: Recent Labs   Lab 10/10/21 0433  CKTOTAL 18*   CBG: Recent Labs  Lab 10/09/21 2145 10/10/21 0905 10/10/21 1215 10/10/21 2059 10/11/21 0723  GLUCAP 243* 170* 164* 150* 122*   Iron Studies: No results for input(s): "IRON", "TIBC", "TRANSFERRIN", "FERRITIN" in the last 72 hours. @lablastinr3 @ Studies/Results: ECHOCARDIOGRAM COMPLETE  Result Date: 10/09/2021    ECHOCARDIOGRAM REPORT   Patient Name:   [name] Date of Exam: 10/09/2021 Medical Rec #:  [id number]        Height:       70.0 in Accession #:    [id number]       Weight:       200.6 lb Date of Birth:  18-Oct-1965        BSA:          2.090 m Patient Age:    56 years         BP:           144/78 mmHg Patient Gender: F                HR:           63 bpm. Exam Location:  Inpatient Procedure: 2D Echo, Intracardiac Opacification Agent, Cardiac Doppler and Color            Doppler Indications:  Anasarca  History:        Patient has prior history of Echocardiogram examinations, most                 recent 04/08/2017. Stroke; Risk Factors:Hypertension and Diabetes.                 CKD.  Sonographer:    Eartha Inch Referring Phys: [id number] ANAND D HONGALGI  Sonographer Comments: Suboptimal parasternal window, suboptimal apical window, suboptimal subcostal window and Technically difficult study due to poor echo windows. Image acquisition challenging due to patient body habitus and Image acquisition challenging due to respiratory motion. Pt was supine due to a large draining abcess on LL. IMPRESSIONS  1. Left ventricular ejection fraction, by estimation, is 60 to 65%. The left ventricle has normal function. The left ventricle has no regional wall motion abnormalities. Left ventricular diastolic parameters are indeterminate.  2. Right ventricular systolic function is normal. The right ventricular size is normal. Tricuspid regurgitation signal is inadequate for assessing PA pressure.  3. The mitral valve is normal in structure. No evidence of mitral valve  regurgitation. No evidence of mitral stenosis.  4. The aortic valve is normal in structure. Aortic valve regurgitation is not visualized. No aortic stenosis is present.  5. The inferior vena cava is dilated in size with >50% respiratory variability, suggesting right atrial pressure of 8 mmHg. FINDINGS  Left Ventricle: Left ventricular ejection fraction, by estimation, is 60 to 65%. The left ventricle has normal function. The left ventricle has no regional wall motion abnormalities. Definity contrast agent was given IV to delineate the left ventricular  endocardial borders. The left ventricular internal cavity size was normal in size. There is no left ventricular hypertrophy. Left ventricular diastolic parameters are indeterminate. Right Ventricle: The right ventricular size is normal. No increase in right ventricular wall thickness. Right ventricular systolic function is normal. Tricuspid regurgitation signal is inadequate for assessing PA pressure. Left Atrium: Left atrial size was normal in size. Right Atrium: Right atrial size was normal in size. Pericardium: There is no evidence of pericardial effusion. Mitral Valve: The mitral valve is normal in structure. No evidence of mitral valve regurgitation. No evidence of mitral valve stenosis. MV peak gradient, 7.3 mmHg. The mean mitral valve gradient is 3.0 mmHg. Tricuspid Valve: The tricuspid valve is normal in structure. Tricuspid valve regurgitation is not demonstrated. No evidence of tricuspid stenosis. Aortic Valve: The aortic valve is normal in structure. Aortic valve regurgitation is not visualized. No aortic stenosis is present. Pulmonic Valve: The pulmonic valve was normal in structure. Pulmonic valve regurgitation is not visualized. No evidence of pulmonic stenosis. Aorta: The aortic root is normal in size and structure. Venous: The inferior vena cava is dilated in size with greater than 50% respiratory variability, suggesting right atrial pressure of 8  mmHg. IAS/Shunts: No atrial level shunt detected by color flow Doppler.  LEFT VENTRICLE PLAX 2D LVIDd:         5.00 cm Diastology LVIDs:         3.30 cm LV e' medial:    5.40 cm/s LV PW:         0.90 cm LV E/e' medial:  21.1 LV IVS:        0.90 cm LV e' lateral:   6.45 cm/s                        LV E/e' lateral: 17.7  RIGHT VENTRICLE  IVC RV S prime:     9.30 cm/s  IVC diam: 2.20 cm TAPSE (M-mode): 1.9 cm LEFT ATRIUM             Index        RIGHT ATRIUM           Index LA diam:        3.90 cm 1.87 cm/m   RA Area:     14.00 cm LA Vol (A2C):   33.1 ml 15.84 ml/m  RA Volume:   31.20 ml  14.93 ml/m LA Vol (A4C):   39.4 ml 18.85 ml/m LA Biplane Vol: 36.8 ml 17.61 ml/m  AORTIC VALVE LVOT Vmax:   134.00 cm/s LVOT Vmean:  97.300 cm/s LVOT VTI:    0.355 m MITRAL VALVE MV Area (PHT): 2.31 cm     SHUNTS MV Peak grad:  7.3 mmHg     Systemic VTI: 0.36 m MV Mean grad:  3.0 mmHg MV Vmax:       1.35 m/s MV Vmean:      86.0 cm/s MV Decel Time: 328 msec MV E velocity: 114.00 cm/s MV A velocity: 103.00 cm/s MV E/A ratio:  1.11 Kardie Tobb DO Electronically signed by Berniece Salines DO Signature Date/Time: 10/09/2021/10:49:42 AM    Final    Medications:  sodium chloride     sodium chloride 10 mL/hr at 09/19/21 1310   DAPTOmycin (CUBICIN) 900 mg in sodium chloride 0.9 % IVPB 900 mg (10/09/21 2039)   methocarbamol (ROBAXIN) IV      alosetron  1 mg Oral BID   amitriptyline  25 mg Oral QHS   amLODipine  10 mg Oral Q24H   atorvastatin  40 mg Oral q1800   carvedilol  6.25 mg Oral BID   Chlorhexidine Gluconate Cloth  6 each Topical Q0600   cyclobenzaprine  10 mg Oral TID   darbepoetin (ARANESP) injection - DIALYSIS  200 mcg Subcutaneous Q Fri-HD   docusate sodium  100 mg Oral BID   DULoxetine  60 mg Oral Daily   famotidine  20 mg Oral Daily   furosemide  80 mg Oral Daily   heparin  5,000 Units Subcutaneous Q8H   heparin  5,000 Units Dialysis Once in dialysis   hydrALAZINE  50 mg Oral TID   insulin aspart   0-6 Units Subcutaneous TID WC   insulin aspart  5 Units Subcutaneous TID WC   insulin glargine-yfgn  12 Units Subcutaneous QHS   multivitamin  1 tablet Oral QHS   pantoprazole  80 mg Oral Daily   sevelamer carbonate  1,600 mg Oral TID WC   sodium chloride flush  3 mL Intravenous Q12H    Dialysis Orders: MWF at [facility], 400/500, EDW 90kg, 2K/2Ca, LUE AVF, heparin 4000 unit bolus  Assessment/Plan: 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 Back to OR 08/28/21, 08/31/21 for L hip debridement and wound vac placement (now removed). Will finish 6 week course of IV Daptomycin on 10/12/21. 2. ESRD: Continue HD on MWF schedule. Intermittent AVF cannulation issues - fine recently. Will continue to monitor. Increasing heparin with HD as tolerated. Will continue to encourage to sit in chair for HD.  3. AVF Access: S/p branch ligation in OR 09/19/2021 per Dr. Virl Cagey.  4. HTN/ volume: BP controlled. On amlodipine/hydralazine/Coreg/Lasix. Still with edema. Weights climbing back up. She signed off early on Monday.  Max UF as tolerated and has been reminded of importance of following fluid and sodium  restrictions.  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 6. Secondary HPTH: Cca controlled. Phos slightly elevated. Continue Renvela as binder. No VDRA for now. 7. DM2: Insulin per primary 8. Dispo: Patient has issues with no handicap ramp at her home. OP HD clinic cannot help her get in and out of her car when she arrives to HD. SNF recommended.  Last admission she was supposed to go to SNF but on day of discharge, she refused SNF placement. Very difficult situation.  Not clear what chance of success she will have as OP regardless of her ability to sit in chair if her home situation is not improved. She says her goal is to be able to walk prior to discharge.     Anice Paganini, PA-C 10/11/2021, 8:40 AM  [employer] Pager: [phone]

## 2021-10-12 DIAGNOSIS — I10 Essential (primary) hypertension: Secondary | ICD-10-CM | POA: Diagnosis not present

## 2021-10-12 DIAGNOSIS — M86252 Subacute osteomyelitis, left femur: Secondary | ICD-10-CM | POA: Diagnosis not present

## 2021-10-12 DIAGNOSIS — N186 End stage renal disease: Secondary | ICD-10-CM | POA: Diagnosis not present

## 2021-10-12 DIAGNOSIS — Z992 Dependence on renal dialysis: Secondary | ICD-10-CM | POA: Diagnosis not present

## 2021-10-12 LAB — GLUCOSE, CAPILLARY
Glucose-Capillary: 129 mg/dL — ABNORMAL HIGH (ref 70–99)
Glucose-Capillary: 202 mg/dL — ABNORMAL HIGH (ref 70–99)
Glucose-Capillary: 141 mg/dL — ABNORMAL HIGH (ref 70–99)
Glucose-Capillary: 172 mg/dL — ABNORMAL HIGH (ref 70–99)

## 2021-10-12 NOTE — Progress Notes (Signed)
[facility] Progress Note   Subjective:   Net UF 5L with HD yesterday. Feels the edema in her right arm and leg are better. Denies SOB, CP, dizziness, abdominal pain and nausea.   Objective Vitals:   10/11/21 1722 10/11/21 2100 10/12/21 0505 10/12/21 0848  BP: (!) 111/54 (!) 118/59 (!) 161/54 (!) 132/50  Pulse: 66 65 69 67  Resp: 18 19 17 16   Temp: 98.4 F (36.9 C) 98.3 F (36.8 C) 98.6 F (37 C) 99.2 F (37.3 C)  TempSrc: Oral  Oral Oral  SpO2: 97% 95% 100% 94%  Weight:      Height:       Physical Exam General: Well appearing, [name] Heart: RRR, no murmurs, rubs or gallops Lungs: Cta bilaterally without wheezing, rhonchi or rales Abdomen: Soft, non-distended, +BS Extremities: L stump with 1+ edema, trace edema in R leg Dialysis Access:  LUE AVF  + bruit  Additional Objective Labs: Basic Metabolic Panel: Recent Labs  Lab 10/07/21 0913 10/11/21 0416  NA 127* 127*  K 5.5* 5.0  CL 91* 92*  CO2 25 26  GLUCOSE 131* 120*  BUN 62* 53*  CREATININE 6.80* 5.94*  CALCIUM 9.0 8.8*  PHOS 5.7* 6.0*   Liver Function Tests: Recent Labs  Lab 10/07/21 0913 10/11/21 0416  ALBUMIN 2.7* 2.7*   No results for input(s): "LIPASE", "AMYLASE" in the last 168 hours. CBC: Recent Labs  Lab 10/07/21 0912 10/11/21 0416  WBC 9.2 8.6  HGB 10.3* 10.6*  HCT 33.1* 33.3*  MCV 91.7 91.0  PLT 352 266   Blood Culture    Component Value Date/Time   SDES TISSUE 08/28/2021 0917   SPECREQUEST LEFT THIGH TISSUE 08/28/2021 0917   CULT  08/28/2021 0917    MODERATE ENTEROCOCCUS FAECIUM VANCOMYCIN RESISTANT ENTEROCOCCUS ISOLATED NO ANAEROBES ISOLATED Sent to [place] for further susceptibility testing. Performed at [facility], [place] [address], [place], [place] [address]    REPTSTATUS 09/02/2021 FINAL 08/28/2021 0917    Cardiac Enzymes: Recent Labs  Lab 10/10/21 0433  CKTOTAL 18*   CBG: Recent Labs  Lab 10/11/21 0723 10/11/21 1317  10/11/21 1640 10/11/21 2059 10/12/21 0721  GLUCAP 122* 159* 181* 127* 129*   Iron Studies: No results for input(s): "IRON", "TIBC", "TRANSFERRIN", "FERRITIN" in the last 72 hours. @lablastinr3 @ Studies/Results: No results found. Medications:  sodium chloride     sodium chloride 10 mL/hr at 09/19/21 1310   DAPTOmycin (CUBICIN) 900 mg in sodium chloride 0.9 % IVPB 900 mg (10/11/21 2134)   methocarbamol (ROBAXIN) IV      alosetron  1 mg Oral BID   amitriptyline  25 mg Oral QHS   amLODipine  10 mg Oral Q24H   atorvastatin  40 mg Oral q1800   carvedilol  6.25 mg Oral BID   Chlorhexidine Gluconate Cloth  6 each Topical Q0600   cyclobenzaprine  10 mg Oral TID   darbepoetin (ARANESP) injection - DIALYSIS  200 mcg Intravenous Q Fri-HD   docusate sodium  100 mg Oral BID   DULoxetine  60 mg Oral Daily   famotidine  20 mg Oral Daily   furosemide  80 mg Oral Daily   heparin  5,000 Units Subcutaneous Q8H   hydrALAZINE  50 mg Oral TID   insulin aspart  0-6 Units Subcutaneous TID WC   insulin aspart  5 Units Subcutaneous TID WC   insulin glargine-yfgn  12 Units Subcutaneous QHS   multivitamin  1 tablet Oral QHS  pantoprazole  80 mg Oral Daily   sevelamer carbonate  1,600 mg Oral TID WC   sodium chloride flush  3 mL Intravenous Q12H    Dialysis Orders: MWF at [facility], 400/500, EDW 90kg, 2K/2Ca, LUE AVF, heparin 4000 unit bolus  Assessment/Plan: 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 Back to OR 08/28/21, 08/31/21 for L hip debridement and wound vac placement (now removed). Will finish 6 week course of IV Daptomycin on 10/12/21. 2. ESRD: Continue HD on MWF schedule. Intermittent AVF cannulation issues - fine recently. Will continue to monitor. Increasing heparin with HD as tolerated. Will continue to encourage to sit in chair for HD.  3. AVF Access: S/p branch ligation in OR 09/19/2021 per Dr. Virl Cagey.  4. HTN/ volume: BP controlled. On  amlodipine/hydralazine/Coreg/Lasix. Still with edema but improved today. She signed off early on Monday.  Max UF as tolerated and has been reminded of importance of following fluid and sodium restrictions.  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 6. Secondary HPTH: Cca controlled. Phos slightly elevated. Continue Renvela as binder. No VDRA for now. 7. DM2: Insulin per primary 8. Dispo: Patient has issues with no handicap ramp at her home. OP HD clinic cannot help her get in and out of her car when she arrives to HD. SNF recommended.  Last admission she was supposed to go to SNF but on day of discharge, she refused SNF placement. Very difficult situation.  Not clear what chance of success she will have as OP regardless of her ability to sit in chair if her home situation is not improved. She says her goal is to be able to walk prior to discharge.   Donna Paganini, PA-C 10/12/2021, 9:34 AM  [facility] Pager: [phone]

## 2021-10-12 NOTE — Progress Notes (Signed)
Progress Note   Patient: [name] HFW:[id number] DOB: [birth year] DOA: 08/26/2021     47 DOS: the patient was seen and examined on 10/12/2021   Brief hospital course: [name] was admitted to the hospital with the working diagnosis of subacute osteomyelitis left intertrochanteric fracture.   56 year old woman PMH including ESRD, complicated left hip and femur issues requiring multiple surgeries, removal of hardware presented 6/26 with bloody drainage from surgical incision.  Seen by orthopedics and underwent I&D 6/28 and 7/1.  Currently unable to progress care secondary to refusal to sit in chair for HD.  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 [name] for discharge; follow-up with orthopedics as an outpatient --per ID daptomycin for 6 weeks with dialysis, stop date 8/12. Follow-up with Dr. Candiss Norse, ID and the [place] clinic -Patient reports that she has significant drainage from the surgical site.  This may be due to significant anasarca/subcutaneous edema. Seen by Dr. [name] who recommends lymphedema pump as outpatient -Generalized edema improving   ESRD --Dialysis MWF.  Patient needs to be able to sit in chair to tolerate outpatient dialysis for discharge --Nephrology managing --s/p fistula revision and branch ligation. -Cont to not tolerate HD. Discussed with Nephrology - recommendations for SNF as pt would not be safe for home d/c (no ramp to get into home) -Cont fluid restriction, concerns were raised regarding compliance to fluid intake -HD 8/7, brief run of 2 hours, reported machine problems, asked to be taken off, underwent laying down in bed.  Patient stated that she is still not ready to have HD on chair for several complaints, pain, fluid, weakness etc. -Remains on Lasix 80 mg orally  daily. -Next HD 8/9.  Had a very long discussion with patient and encouraged her to try HD sitting on chair.  She seemed reluctantly agreeable.  As per nephrology follow-up, patient has issues with no handicap ramp at her home and the outpatient HD clinic cannot help her get in and out of her car when she arrives to HD.  They indicate that it is not clear what chance of success she will have as outpatient regardless of her ability to sit in chair if her home situation is not improved. -cont with volume management at next HD session   Anasarca: Secondary to ESRD and volume overload.  Volume management across HD.  Still remains significantly volume overloaded.  Most recent TTE in 2019 had an LVEF of 60 to 65%.  Requested updated TTE, unremarkable with EF 606-5% -Swelling improving with HD sessions   Hyponatremia Suspect due to renal disease and volume.  Management per nephrology.  Hyperkalemia  management across HD.     Anemia of chronic disease/CKD and, component of acute blood loss anemia from wound --Status post 2 unit packed red blood cells 6/29 --Hemoglobin stable in the 9-10 range. -Continue Aranesp per nephrology.   Uncontrolled type 2 diabetes with hypoglycemia --A1c 7.1 in 07/2021. --CBG stable. Continue Semglee, NovoLog, sliding scale. CBG stable  Essential hypertension --stable. Continue amlodipine, Coreg, hydralazine and Lasix.   Hyperlipidemia --continue Lipitor   History of CVA Continue statins.   Anxiety and depression: Continue Cymbalta and Elavil  GERD, no esophagitis Continue Dexilant  Tobacco dependence Cessation counseled, declines patch.       Subjective: States LE edema is improving  Physical Exam: Vitals:   10/11/21  1722 10/11/21 2100 10/12/21 0505 10/12/21 0848  BP: (!) 111/54 (!) 118/59 (!) 161/54 (!) 132/50  Pulse: 66 65 69 67  Resp: 18 19 17 16   Temp: 98.4 F (36.9 C) 98.3 F (36.8 C) 98.6 F (37 C) 99.2 F (37.3 C)  TempSrc: Oral  Oral  Oral  SpO2: 97% 95% 100% 94%  Weight:      Height:       General exam: Conversant, in no acute distress Respiratory system: normal chest rise, clear, no audible wheezing Cardiovascular system: regular rhythm, s1-s2 Gastrointestinal system: Nondistended, nontender, pos BS Central nervous system: No seizures, no tremors Extremities: No cyanosis, no joint deformities Skin: No rashes, no pallor Psychiatry: Affect normal // no auditory hallucinations   Data Reviewed:  There are no new results to review at this time.  Family Communication: Pt in room, family not at bedside  Disposition: Status is: Inpatient Remains inpatient appropriate because: Severity of illness  Planned Discharge Destination: Home    Author: Marylu Lund, MD 10/12/2021 3:12 PM  For on call review [email or web address].

## 2021-10-13 DIAGNOSIS — Z992 Dependence on renal dialysis: Secondary | ICD-10-CM | POA: Diagnosis not present

## 2021-10-13 DIAGNOSIS — I10 Essential (primary) hypertension: Secondary | ICD-10-CM | POA: Diagnosis not present

## 2021-10-13 DIAGNOSIS — M86252 Subacute osteomyelitis, left femur: Secondary | ICD-10-CM | POA: Diagnosis not present

## 2021-10-13 DIAGNOSIS — N186 End stage renal disease: Secondary | ICD-10-CM | POA: Diagnosis not present

## 2021-10-13 LAB — GLUCOSE, CAPILLARY
Glucose-Capillary: 101 mg/dL — ABNORMAL HIGH (ref 70–99)
Glucose-Capillary: 178 mg/dL — ABNORMAL HIGH (ref 70–99)
Glucose-Capillary: 151 mg/dL — ABNORMAL HIGH (ref 70–99)
Glucose-Capillary: 221 mg/dL — ABNORMAL HIGH (ref 70–99)

## 2021-10-13 MED ORDER — CHLORHEXIDINE GLUCONATE CLOTH 2 % EX PADS
6.0000 | MEDICATED_PAD | Freq: Every day | CUTANEOUS | Status: DC
Start: 2021-10-13 — End: 2021-10-13

## 2021-10-13 NOTE — Progress Notes (Signed)
Progress Note   Patient: [name] VOZ:[id number] DOB: 11-18-1965 DOA: 08/26/2021     48 DOS: the patient was seen and examined on 10/13/2021   Brief hospital course: [name] was admitted to the hospital with the working diagnosis of subacute osteomyelitis left intertrochanteric fracture.   56 year old woman PMH including ESRD, complicated left hip and femur issues requiring multiple surgeries, removal of hardware presented 6/26 with bloody drainage from surgical incision.  Seen by orthopedics and underwent I&D 6/28 and 7/1.  Currently unable to progress care secondary to refusal to sit in chair for HD.  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 Follow-up with [name], ID and the [place] clinic -Patient reports that she has significant drainage from the surgical site.  This may be due to significant anasarca/subcutaneous edema. Earlier seen by [name] who recommended lymphedema pump as outpatient -Generalized edema improving with HD   ESRD --Dialysis MWF.  Patient needs to be able to sit in chair to tolerate outpatient dialysis for discharge --Nephrology managing --s/p fistula revision and branch ligation. -Cont to not tolerate HD. Discussed with Nephrology - recommendations for SNF as pt would not be safe for home d/c (no ramp to get into home) -Cont fluid restriction, concerns were raised regarding compliance to fluid intake -HD 8/7, brief run of 2 hours, reported machine problems, asked to be taken off, underwent laying down in bed.  Patient stated that she is still not ready to have HD on chair for several complaints, pain, fluid, weakness etc. -Remains on Lasix 80 mg  orally daily. -Next HD 8/9.  Had a very long discussion with patient and encouraged her to try HD sitting on chair.  She seemed reluctantly agreeable.  As per nephrology follow-up, patient has issues with no handicap ramp at her home and the outpatient HD clinic cannot help her get in and out of her car when she arrives to HD.  They indicate that it is not clear what chance of success she will have as outpatient regardless of her ability to sit in chair if her home situation is not improved. -cont with volume management at next HD session   Anasarca: Secondary to ESRD and volume overload.  Volume management across HD.  Still remains significantly volume overloaded.  Most recent TTE in 2019 had an LVEF of 60 to 65%.  Requested updated TTE, unremarkable with EF 606-5% -Swelling improving with HD sessions   Hyponatremia Suspect due to renal disease and volume.  Management per nephrology.  Hyperkalemia  management across HD.     Anemia of chronic disease/CKD and, component of acute blood loss anemia from wound --Status post 2 unit packed red blood cells 6/29 --Hemoglobin stable in the 9-10 range. -Continue Aranesp per nephrology.   Uncontrolled type 2 diabetes with hypoglycemia --A1c 7.1 in 07/2021. --CBG stable. Continue Semglee, NovoLog, sliding scale. CBG stable  Essential hypertension --stable. Continue amlodipine, Coreg, hydralazine and Lasix.   Hyperlipidemia --continue Lipitor   History of CVA Continue statins.   Anxiety and depression: Continue Cymbalta and Elavil  GERD, no esophagitis Continue Dexilant  Tobacco dependence Cessation counseled, declines patch.       Subjective: Without complaints this AM. States swelling seems imroving  Physical Exam: Vitals:   10/12/21 1742 10/12/21 2126 10/13/21 0611 10/13/21 0948  BP: 137/66 (!) 138/56 (!) 146/59 (!) 149/65  Pulse: 60 (!) 55 62 65  Resp: 16 16 16 19   Temp: 98.4 F (36.9 C) 97.9 F (36.6 C) 98.3 F (36.8 C)  98.4 F (36.9 C)  TempSrc: Oral Oral Oral   SpO2: 100% 98% 98% 100%  Weight:      Height:       General exam: Awake, laying in bed, in nad Respiratory system: Normal respiratory effort, no wheezing Cardiovascular system: regular rate, s1, s2 Gastrointestinal system: Soft, nondistended, positive BS Central nervous system: CN2-12 grossly intact, strength intact Extremities: Perfused, no clubbing Skin: Normal skin turgor, no notable skin lesions seen Psychiatry: Mood normal // no visual hallucinations   Data Reviewed:  There are no new results to review at this time.  Family Communication: Pt in room, family not at bedside  Disposition: Status is: Inpatient Remains inpatient appropriate because: Severity of illness  Planned Discharge Destination: Home    Author: Marylu Lund, MD 10/13/2021 3:35 PM  For on call review [email or web address].

## 2021-10-13 NOTE — Progress Notes (Signed)
[name] Progress Note   Subjective:   Seen in room, no new concerns today. Denies SOB, CP, dizziness. Still with edema in L stump but feels it is improving. Does not feel like she can sit in a recliner for HD until her swelling is resolved.   Objective Vitals:   10/12/21 0848 10/12/21 1742 10/12/21 2126 10/13/21 0611  BP: (!) 132/50 137/66 (!) 138/56 (!) 146/59  Pulse: 67 60 (!) 55 62  Resp: 16 16 16 16   Temp: 99.2 F (37.3 C) 98.4 F (36.9 C) 97.9 F (36.6 C) 98.3 F (36.8 C)  TempSrc: Oral Oral Oral Oral  SpO2: 94% 100% 98% 98%  Weight:      Height:       Physical Exam General: Well appearing, [name] Heart: RRR, no murmurs, rubs or gallops Lungs: CTA bilaterally without wheezing, rhonchi or rales Abdomen: Soft, non-distended, +BS Extremities: L stump with 1+ edema, trace edema in R leg w/ compression stocking Dialysis Access:  LUE AVF  + bruit  Additional Objective Labs: Basic Metabolic Panel: Recent Labs  Lab 10/07/21 0913 10/11/21 0416  NA 127* 127*  K 5.5* 5.0  CL 91* 92*  CO2 25 26  GLUCOSE 131* 120*  BUN 62* 53*  CREATININE 6.80* 5.94*  CALCIUM 9.0 8.8*  PHOS 5.7* 6.0*   Liver Function Tests: Recent Labs  Lab 10/07/21 0913 10/11/21 0416  ALBUMIN 2.7* 2.7*   No results for input(s): "LIPASE", "AMYLASE" in the last 168 hours. CBC: Recent Labs  Lab 10/07/21 0912 10/11/21 0416  WBC 9.2 8.6  HGB 10.3* 10.6*  HCT 33.1* 33.3*  MCV 91.7 91.0  PLT 352 266   Blood Culture    Component Value Date/Time   SDES TISSUE 08/28/2021 0917   SPECREQUEST LEFT THIGH TISSUE 08/28/2021 0917   CULT  08/28/2021 0917    MODERATE ENTEROCOCCUS FAECIUM VANCOMYCIN RESISTANT ENTEROCOCCUS ISOLATED NO ANAEROBES ISOLATED Sent to [place] for further susceptibility testing. Performed at [facility], [place] [address], [place], [place] [address]    REPTSTATUS 09/02/2021 FINAL 08/28/2021 0917    Cardiac Enzymes: Recent Labs  Lab  10/10/21 0433  CKTOTAL 18*   CBG: Recent Labs  Lab 10/12/21 0721 10/12/21 1134 10/12/21 1653 10/12/21 2129 10/13/21 0740  GLUCAP 129* 202* 141* 172* 101*   Iron Studies: No results for input(s): "IRON", "TIBC", "TRANSFERRIN", "FERRITIN" in the last 72 hours. @lablastinr3 @ Studies/Results: No results found. Medications:  sodium chloride     sodium chloride 10 mL/hr at 09/19/21 1310   methocarbamol (ROBAXIN) IV      alosetron  1 mg Oral BID   amitriptyline  25 mg Oral QHS   amLODipine  10 mg Oral Q24H   atorvastatin  40 mg Oral q1800   carvedilol  6.25 mg Oral BID   Chlorhexidine Gluconate Cloth  6 each Topical Q0600   cyclobenzaprine  10 mg Oral TID   darbepoetin (ARANESP) injection - DIALYSIS  200 mcg Intravenous Q Fri-HD   docusate sodium  100 mg Oral BID   DULoxetine  60 mg Oral Daily   famotidine  20 mg Oral Daily   furosemide  80 mg Oral Daily   heparin  5,000 Units Subcutaneous Q8H   hydrALAZINE  50 mg Oral TID   insulin aspart  0-6 Units Subcutaneous TID WC   insulin aspart  5 Units Subcutaneous TID WC   insulin glargine-yfgn  12 Units Subcutaneous QHS   multivitamin  1 tablet Oral  QHS   pantoprazole  80 mg Oral Daily   sevelamer carbonate  1,600 mg Oral TID WC   sodium chloride flush  3 mL Intravenous Q12H    Dialysis Orders: MWF at [place], 400/500, EDW 90kg, 2K/2Ca, LUE AVF, heparin 4000 unit bolus  Assessment/Plan: 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 Back to OR 08/28/21, 08/31/21 for L hip debridement and wound vac placement (now removed). Finished 6 week course of IV Daptomycin on 10/12/21. 2. ESRD: Continue HD on MWF schedule. Intermittent AVF cannulation issues but working well recently. Will continue to monitor. Will continue to encourage to sit in chair for HD.  3. AVF Access: S/p branch ligation in OR 09/19/2021 per Dr. Virl Cagey.  4. HTN/ volume: BP controlled. On amlodipine/hydralazine/Coreg/Lasix. Still with  edema but improved today. Max UF as tolerated and has been reminded of importance of following fluid and sodium restrictions.  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 6. Secondary HPTH: Cca controlled. Phos slightly elevated. Continue Renvela as binder. No VDRA for now. 7. DM2: Insulin per primary 8. Dispo: Patient has issues with no handicap ramp at her home. OP HD clinic cannot help her get in and out of her car when she arrives to HD. SNF recommended.  Last admission she was supposed to go to SNF but on day of discharge, she refused SNF placement. Very difficult situation.  Not clear what chance of success she will have as OP regardless of her ability to sit in chair if her home situation is not improved. She says her goal is to be able to walk prior to discharge.   Anice Paganini, PA-C 10/13/2021, 8:46 AM  [facility] Pager: [phone]

## 2021-10-14 DIAGNOSIS — I10 Essential (primary) hypertension: Secondary | ICD-10-CM | POA: Diagnosis not present

## 2021-10-14 DIAGNOSIS — N186 End stage renal disease: Secondary | ICD-10-CM | POA: Diagnosis not present

## 2021-10-14 DIAGNOSIS — M86252 Subacute osteomyelitis, left femur: Secondary | ICD-10-CM | POA: Diagnosis not present

## 2021-10-14 DIAGNOSIS — F419 Anxiety disorder, unspecified: Secondary | ICD-10-CM | POA: Diagnosis not present

## 2021-10-14 LAB — GLUCOSE, CAPILLARY
Glucose-Capillary: 138 mg/dL — ABNORMAL HIGH (ref 70–99)
Glucose-Capillary: 119 mg/dL — ABNORMAL HIGH (ref 70–99)
Glucose-Capillary: 180 mg/dL — ABNORMAL HIGH (ref 70–99)

## 2021-10-14 NOTE — Progress Notes (Signed)
Received patient in bed with on going HD tx . Alert and oriented.  Informed consent signed and in  chart. At [facility] machine clotted. Changed new set.  Treatment initiated: 1542 Treatment completed: 2030 ( Pt requested to be off the machine, wants to go back to her room)49 mins left to completion of the tx.    Patient tolerated well.  Transported back to the room  alert, without acute distress.  Hand-off given to patient's nurse.   Access used: Left AVF Access issues: none  Total UF removed: 2876ml Medication(s) given: Zofran Post HD VS: BP 124/7mmHg, HR 70 bpm, 99% on RA, RR 12 brpm Post HD weight: 91kg   [facility]

## 2021-10-14 NOTE — Progress Notes (Signed)
[facility] Progress Note   Subjective:  Seen in room. No new concerns. Edema improving. Dialysis today --still declining recliner.    Objective Vitals:   10/13/21 2223 10/13/21 2223 10/14/21 0552 10/14/21 0552  BP: (!) 133/55 (!) 133/55  137/64  Pulse:  60  (!) 59  Resp:  18  16  Temp:   98.2 F (36.8 C) 97.8 F (36.6 C)  TempSrc:   Oral Oral  SpO2:  99%  100%  Weight:      Height:       Physical Exam General: Well appearing, [name] Heart: RRR, no murmurs, rubs or gallops Lungs: CTA bilaterally without wheezing, rhonchi or rales Abdomen: Soft, non-distended, +BS Extremities: L stump with 1+ edema, trace edema in R leg w/ compression stocking Dialysis Access:  LUE AVF  + bruit  Additional Objective Labs: Basic Metabolic Panel: Recent Labs  Lab 10/11/21 0416  NA 127*  K 5.0  CL 92*  CO2 26  GLUCOSE 120*  BUN 53*  CREATININE 5.94*  CALCIUM 8.8*  PHOS 6.0*    Liver Function Tests: Recent Labs  Lab 10/11/21 0416  ALBUMIN 2.7*    No results for input(s): "LIPASE", "AMYLASE" in the last 168 hours. CBC: Recent Labs  Lab 10/11/21 0416  WBC 8.6  HGB 10.6*  HCT 33.3*  MCV 91.0  PLT 266    Blood Culture    Component Value Date/Time   SDES TISSUE 08/28/2021 0917   SPECREQUEST LEFT THIGH TISSUE 08/28/2021 0917   CULT  08/28/2021 0917    MODERATE ENTEROCOCCUS FAECIUM VANCOMYCIN RESISTANT ENTEROCOCCUS ISOLATED NO ANAEROBES ISOLATED Sent to [place] for further susceptibility testing. Performed at [facility], [place] [address], [place], [place] [address]    REPTSTATUS 09/02/2021 FINAL 08/28/2021 0917    Cardiac Enzymes: Recent Labs  Lab 10/10/21 0433  CKTOTAL 18*    CBG: Recent Labs  Lab 10/13/21 0740 10/13/21 1138 10/13/21 1627 10/13/21 2215 10/14/21 0723  GLUCAP 101* 178* 151* 221* 138*    Iron Studies: No results for input(s): "IRON", "TIBC", "TRANSFERRIN", "FERRITIN" in the last 72  hours. @lablastinr3 @ Studies/Results: No results found. Medications:  sodium chloride     sodium chloride 10 mL/hr at 09/19/21 1310   methocarbamol (ROBAXIN) IV      alosetron  1 mg Oral BID   amitriptyline  25 mg Oral QHS   amLODipine  10 mg Oral Q24H   atorvastatin  40 mg Oral q1800   carvedilol  6.25 mg Oral BID   Chlorhexidine Gluconate Cloth  6 each Topical Q0600   cyclobenzaprine  10 mg Oral TID   darbepoetin (ARANESP) injection - DIALYSIS  200 mcg Intravenous Q Fri-HD   docusate sodium  100 mg Oral BID   DULoxetine  60 mg Oral Daily   famotidine  20 mg Oral Daily   furosemide  80 mg Oral Daily   heparin  5,000 Units Subcutaneous Q8H   hydrALAZINE  50 mg Oral TID   insulin aspart  0-6 Units Subcutaneous TID WC   insulin aspart  5 Units Subcutaneous TID WC   insulin glargine-yfgn  12 Units Subcutaneous QHS   multivitamin  1 tablet Oral QHS   pantoprazole  80 mg Oral Daily   sevelamer carbonate  1,600 mg Oral TID WC   sodium chloride flush  3 mL Intravenous Q12H    Dialysis Orders: MWF at [place], 400/500, EDW 90kg, 2K/2Ca, LUE AVF, heparin 4000 unit bolus  Assessment/Plan: 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 Back to OR 08/28/21, 08/31/21 for L hip debridement and wound vac placement (now removed). Finished 6 week course of IV Daptomycin on 10/12/21. 2. ESRD: Continue HD on MWF schedule. Intermittent AVF cannulation issues but working well recently. Will continue to monitor. Will continue to encourage to sit in chair for HD.  3. AVF Access: S/p branch ligation in OR 09/19/2021 per Dr. Virl Cagey.  4. HTN/ volume: BP controlled. On amlodipine/hydralazine/Coreg/Lasix. Still with edema but improved today. Max UF as tolerated and has been reminded of importance of following fluid and sodium restrictions.  5. 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 6. Secondary HPTH: Cca controlled. Phos slightly elevated. Continue Renvela  as binder. No VDRA for now. 7. DM2: Insulin per primary 8. Dispo: Patient has issues with no handicap ramp at her home. OP HD clinic cannot help her get in and out of her car when she arrives to HD. SNF recommended.  Last admission she was supposed to go to SNF but on day of discharge, she refused SNF placement. Very difficult situation.  Not clear what chance of success she will have as OP regardless of her ability to sit in chair if her home situation is not improved. She says her goal is to be able to walk prior to discharge.   Lynnda Child PA-C [facility] 10/14/2021,9:19 AM

## 2021-10-14 NOTE — Progress Notes (Signed)
Progress Note   Patient: [name] YIR:[id number] DOB: 26-Oct-1965 DOA: 08/26/2021     49 DOS: the patient was seen and examined on 10/14/2021   Brief hospital course: [name] was admitted to the hospital with the working diagnosis of subacute osteomyelitis left intertrochanteric fracture.   56 year old woman PMH including ESRD, complicated left hip and femur issues requiring multiple surgeries, removal of hardware presented 6/26 with bloody drainage from surgical incision.  Seen by orthopedics and underwent I&D 6/28 and 7/1.  Currently unable to progress care secondary to refusal to sit in chair for HD.  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 [name] for discharge; follow-up with orthopedics as an outpatient --per ID daptomycin for 6 weeks with dialysis, stop date 8/12. Follow-up with Dr. Candiss Norse, ID and the [place] clinic -Patient reports that she has significant drainage from the surgical site.  This may be due to significant anasarca/subcutaneous edema. Earlier seen by Dr. [name] who recommended lymphedema pump as outpatient -Generalized edema seems to be improving with dialysis   ESRD --Dialysis MWF.  Patient needs to be able to sit in chair to tolerate outpatient dialysis for discharge --Nephrology managing --s/p fistula revision and branch ligation. -Cont to not tolerate HD. Discussed with Nephrology - recommendations for SNF as pt would not be safe for home d/c (no ramp to get into home) -Cont fluid restriction, concerns were raised regarding compliance to fluid intake -HD 8/7, brief run of 2 hours, reported machine problems, asked to be taken off, underwent laying down in bed.  Patient stated that she is still not ready to have HD on chair for several complaints, pain, fluid, weakness etc. -Remains  on Lasix 80 mg orally daily. -As per nephrology follow-up, patient has issues with no handicap ramp at her home and the outpatient HD clinic cannot help her get in and out of her car when she arrives to HD.  They indicate that it is not clear what chance of success she will have as outpatient regardless of her ability to sit in chair if her home situation is not improved. -cont with volume management at next HD session   Anasarca: Secondary to ESRD and volume overload.  Volume management across HD.  Still remains significantly volume overloaded.  Most recent TTE in 2019 had an LVEF of 60 to 65%.  Requested updated TTE, unremarkable with EF 606-5% -Swelling improving with HD sessions   Hyponatremia Suspect due to renal disease and volume.  Management per nephrology.  Hyperkalemia  management across HD.     Anemia of chronic disease/CKD and, component of acute blood loss anemia from wound --Status post 2 unit packed red blood cells 6/29 --Hemoglobin stable in the 9-10 range. -Continue Aranesp per nephrology.   Uncontrolled type 2 diabetes with hypoglycemia --A1c 7.1 in 07/2021. --CBG stable. Continue Semglee, NovoLog, sliding scale. CBG stable  Essential hypertension --stable. Continue amlodipine, Coreg, hydralazine and Lasix.   Hyperlipidemia --continue Lipitor   History of CVA Continue statins.   Anxiety and depression: Continue Cymbalta and Elavil  GERD, no esophagitis Continue Dexilant  Tobacco dependence Cessation counseled, declines patch.       Subjective: States edema seems improving  Physical Exam: Vitals:   10/14/21 0552 10/14/21 0941 10/14/21 1500 10/14/21 1542  BP: 137/64 (!) 141/61 (!) 148/105 113/76  Pulse: (!) 59 60 62  64  Resp: 16 17 12 10   Temp: 97.8 F (36.6 C) 98 F (36.7 C) 98 F (36.7 C)   TempSrc: Oral Oral    SpO2: 100% 97% 97%   Weight:      Height:       General exam: Conversant, in no acute distress Respiratory system: normal chest  rise, clear, no audible wheezing Cardiovascular system: regular rhythm, s1-s2 Gastrointestinal system: Nondistended, nontender, pos BS Central nervous system: No seizures, no tremors Extremities: No cyanosis, no joint deformities Skin: No rashes, no pallor Psychiatry: Affect normal // no auditory hallucinations   Data Reviewed:  There are no new results to review at this time.  Family Communication: Pt in room, family not at bedside  Disposition: Status is: Inpatient Remains inpatient appropriate because: Severity of illness  Planned Discharge Destination: Home    Author: Marylu Lund, MD 10/14/2021 4:33 PM  For on call review [email or web address].

## 2021-10-15 DIAGNOSIS — N186 End stage renal disease: Secondary | ICD-10-CM | POA: Diagnosis not present

## 2021-10-15 DIAGNOSIS — M86252 Subacute osteomyelitis, left femur: Secondary | ICD-10-CM | POA: Diagnosis not present

## 2021-10-15 DIAGNOSIS — I10 Essential (primary) hypertension: Secondary | ICD-10-CM | POA: Diagnosis not present

## 2021-10-15 DIAGNOSIS — Z992 Dependence on renal dialysis: Secondary | ICD-10-CM | POA: Diagnosis not present

## 2021-10-15 LAB — GLUCOSE, CAPILLARY
Glucose-Capillary: 149 mg/dL — ABNORMAL HIGH (ref 70–99)
Glucose-Capillary: 154 mg/dL — ABNORMAL HIGH (ref 70–99)
Glucose-Capillary: 137 mg/dL — ABNORMAL HIGH (ref 70–99)
Glucose-Capillary: 135 mg/dL — ABNORMAL HIGH (ref 70–99)

## 2021-10-15 MED ORDER — CHLORHEXIDINE GLUCONATE CLOTH 2 % EX PADS
6.0000 | MEDICATED_PAD | Freq: Every day | CUTANEOUS | Status: DC
  Administered 2021-10-15 – 2021-10-19 (×4): 6 via TOPICAL

## 2021-10-15 NOTE — Progress Notes (Signed)
Progress Note   Patient: [name] JSE:[id number] DOB: 09-26-1965 DOA: 08/26/2021     50 DOS: the patient was seen and examined on 10/15/2021   Brief hospital course: [name] was admitted to the hospital with the working diagnosis of subacute osteomyelitis left intertrochanteric fracture.   56 year old woman PMH including ESRD, complicated left hip and femur issues requiring multiple surgeries, removal of hardware presented 6/26 with bloody drainage from surgical incision.  Seen by orthopedics and underwent I&D 6/28 and 7/1.  Currently unable to progress care secondary to refusal to sit in chair for HD.  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 [name] for discharge; follow-up with orthopedics as an outpatient --per ID daptomycin for 6 weeks with dialysis, stop date 8/12. Follow-up with Dr. Candiss Norse, ID and the [place] clinic -recently reported significant drainage from the surgical site.  Later  seen by Dr. [name] who recommended lymphedema pump as outpatient, pump cannot be arranged in-house -Generalized edema seems to be improving with dialysis   ESRD --Dialysis MWF.  Patient needs to be able to sit in chair to tolerate outpatient dialysis for discharge --Nephrology managing --s/p fistula revision and branch ligation. -Cont to not tolerate HD. Discussed with Nephrology - recommendations for SNF as pt would not be safe for home d/c (no ramp to get into home) -Cont fluid restriction, concerns were raised regarding compliance to fluid intake -HD 8/7, brief run of 2 hours, reported machine problems, asked to be taken off, underwent laying down in bed.  Patient stated that she is still not ready to have HD on chair for several complaints, pain, fluid, weakness etc. -Remains on Lasix 80 mg orally daily. -As per  nephrology follow-up, patient has issues with no handicap ramp at her home and the outpatient HD clinic cannot help her get in and out of her car when she arrives to HD.  They indicate that it is not clear what chance of success she will have as outpatient regardless of her ability to sit in chair if her home situation is not improved. -cont with volume management at next HD session   Anasarca: Secondary to ESRD and volume overload.  Volume management across HD.  Still remains significantly volume overloaded.  Most recent TTE in 2019 had an LVEF of 60 to 65%.  Requested updated TTE, unremarkable with EF 606-5% -Swelling improving with HD sessions   Hyponatremia Suspect due to renal disease and volume.  Management per nephrology.  Hyperkalemia  management across HD.     Anemia of chronic disease/CKD and, component of acute blood loss anemia from wound --Hemoglobin stable in the 9-10 range. -Continue Aranesp per nephrology.   Uncontrolled type 2 diabetes with hypoglycemia --A1c 7.1 in 07/2021. --CBG stable. Continue Semglee, NovoLog, sliding scale. CBG stable  Essential hypertension --stable. Continue amlodipine, Coreg, hydralazine and Lasix.   Hyperlipidemia --continue Lipitor   History of CVA Continue statins.   Anxiety and depression: Continue Cymbalta and Elavil  GERD, no esophagitis Continue Dexilant  Tobacco dependence Cessation counseled, declines patch.       Subjective: Continues with swelling of R stump, claims it is improving  Physical Exam: Vitals:   10/14/21 2108 10/15/21 0529 10/15/21 0830 10/15/21 1530  BP: 135/63 (!) 154/53 (!) 130/51 (!) 126/58  Pulse: 71 64 66 65  Resp: 18 18 18  18  Temp: 98.5 F (36.9 C) 98.5 F (36.9 C) 97.8 F (36.6 C) 98.1 F (36.7 C)  TempSrc:  Oral Oral Oral  SpO2: 97% 100% 98% 96%  Weight:      Height:       General exam: Awake, laying in bed, in nad Respiratory system: Normal respiratory effort, no  wheezing Cardiovascular system: regular rate, s1, s2 Gastrointestinal system: Soft, nondistended, positive BS Central nervous system: CN2-12 grossly intact, strength intact Extremities: Perfused, no clubbing Skin: Normal skin turgor, no notable skin lesions seen Psychiatry: Mood normal // no visual hallucinations   Data Reviewed:  There are no new results to review at this time.  Family Communication: Pt in room, family not at bedside  Disposition: Status is: Inpatient Remains inpatient appropriate because: Severity of illness  Planned Discharge Destination: Home    Author: Marylu Lund, MD 10/15/2021 3:42 PM  For on call review [email or web address].

## 2021-10-15 NOTE — Progress Notes (Signed)
Physical Therapy Treatment Patient Details Name: [name] MRN: [id number] DOB: May 20, 1965 Today's Date: 10/15/2021   History of Present Illness 56 y.o. female presented to ED 6/26 from dialysis with increased bloody drainage from L hip wound. 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 s/p hardware removal of left hip with partial resection of tuft tissue and femure that were nonviable on 08/10/21 Discharged home 6/23. underwent L hip debridement and wound vac placement 6/28; +Left intertrochanteric non-union,  Fracture of left distal femur  PMH: hypertension, hyperlipidemia, ESRD on HD MWF, history of left BKA in 2021, depression/anxiety, stroke, tobacco use, T2DM,  insomnia, chronic pain syndrome,    PT Comments    Patient showing slight improvement in sit to stand, but still not able to pivot to recliner. Unable to don her Left prosthesis due to edema and pt's family cannot find her shrinker at home. 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 Patient unwilling to atttempt sliding board to recliner and sit up x 1 hour with therapies coming back to assist her back to bed due to "stomach issues." (She's afraid she is going to have a BM soon and would be stuck in the chair when she needed to.)  Pilar Plate discussion re: benefits of transitioning to SNF. She is now willing to go to SNF. Asked pt why she feels she is still in the hospital, and she reports it is to make sure infection in Left hip has been managed and she won't have to come right back to hospital as she did after previous discharge. She is concerned with the amount of drainage she is having from the wound.      Recommendations for follow up therapy are one component of a multi-disciplinary discharge planning process, led by the attending physician.  Recommendations may be updated based on patient status, additional functional criteria and  insurance authorization.  Follow Up Recommendations  Skilled nursing-short term rehab (<3 hours/day) Can patient physically be transported by private vehicle: No   Assistance Recommended at Discharge Intermittent Supervision/Assistance  Patient can return home with the following A lot of help with bathing/dressing/bathroom;Assistance with cooking/housework;Assist for transportation;Two people to help with walking and/or transfers;Help with stairs or ramp for entrance   Equipment Recommendations  Other (comment) (Seems well-equipped; agree with looking into medical [name] transport for HD)    Recommendations for Other Services OT consult     Precautions / Restrictions Precautions Precautions: Fall;Other (comment) Precaution Comments: L BKA (baseline) Restrictions Weight Bearing Restrictions: No LLE Weight Bearing: Weight bearing as tolerated Other Position/Activity Restrictions: L distal femur fx; per Dr [name] 8/10 pt can full WB     Mobility  Bed Mobility Overal bed mobility: Needs Assistance Bed Mobility: Supine to Sit, Sit to Supine Rolling: Modified independent (Device/Increase time) Sidelying to sit: HOB elevated, Modified independent (Device/Increase time)   Sit to supine: Modified independent (Device/Increase time)   General bed mobility comments: increase time and effort to perform; no cues or guarding    Transfers Overall transfer level: Needs assistance Equipment used: Rolling walker (2 wheels), Ambulation equipment used Transfers: Sit to/from Stand Sit to Stand: +2 physical assistance, Max assist, Mod assist           General transfer comment: x6 sit to stand with RW from bed elevated ~4" from lowest height. Stood for 50-100 seconds each time. Refused plan to sliding board/scoot to recliner and stay  up x 1 hour due to "upset stomach" and feels she would have to use the bathroom urgently and didn't want to be in the chair.    Ambulation/Gait                    Stairs             Wheelchair Mobility    Modified Rankin (Stroke Patients Only)       Balance Overall balance assessment: Needs assistance Sitting-balance support: Feet supported Sitting balance-Leahy Scale: Good Sitting balance - Comments: no assistance with sitting balance   Standing balance support: Bilateral upper extremity supported, During functional activity, Reliant on assistive device for balance Standing balance-Leahy Scale: Poor                              Cognition Arousal/Alertness: Awake/alert Behavior During Therapy: WFL for tasks assessed/performed Overall Cognitive Status: Within Functional Limits for tasks assessed                                          Exercises Other Exercises Other Exercises: pt did LAQ with RLE prior to standing trials to decr knee pain    General Comments General comments (skin integrity, edema, etc.): Pt unable to try donning L prosthesis due to continued edema. Her daughter has not been able to locate her shrinker sock.      Pertinent Vitals/Pain Pain Assessment Pain Assessment: Faces Faces Pain Scale: Hurts little more Pain Location: rt knee>lt knee Pain Descriptors / Indicators: Aching, Discomfort, Grimacing, Guarding Pain Intervention(s): Limited activity within patient's tolerance, Monitored during session, Premedicated before session, Other (comment) (pt did warm-up exercises/ROM prior to PT/OT arrival)    Home Living                          Prior Function            PT Goals (current goals can now be found in the care plan section) Acute Rehab PT Goals Patient Stated Goal: less pain with movement Time For Goal Achievement: 10/24/21 Potential to Achieve Goals: Fair Progress towards PT goals: Progressing toward goals    Frequency    Min 2X/week      PT Plan Current plan remains appropriate;Frequency needs to be updated    Co-evaluation PT/OT/SLP  Co-Evaluation/Treatment: Yes Reason for Co-Treatment: For patient/therapist safety;To address functional/ADL transfers PT goals addressed during session: Mobility/safety with mobility;Balance;Proper use of DME;Strengthening/ROM        AM-PAC PT "6 Clicks" Mobility   Outcome Measure  Help needed turning from your back to your side while in a flat bed without using bedrails?: None Help needed moving from lying on your back to sitting on the side of a flat bed without using bedrails?: None Help needed moving to and from a bed to a chair (including a wheelchair)?: Total Help needed standing up from a chair using your arms (e.g., wheelchair or bedside chair)?: Total Help needed to walk in hospital room?: Total Help needed climbing 3-5 steps with a railing? : Total 6 Click Score: 12    End of Session Equipment Utilized During Treatment: Gait belt Activity Tolerance: Patient limited by pain Patient left: in bed;with call bell/phone within reach   PT Visit Diagnosis: Other abnormalities of gait and mobility (R26.89);Muscle  weakness (generalized) (M62.81);History of falling (Z91.81);Pain Pain - Right/Left: Left Pain - part of body: Leg     Time: 6720-9198 PT Time Calculation (min) (ACUTE ONLY): 38 min  Charges:  $Gait Training: 23-37 mins                      [place]  Office [phone]    [name] 10/15/2021, 1:51 PM

## 2021-10-15 NOTE — Progress Notes (Signed)
Occupational Therapy Treatment Patient Details Name: [name] MRN: [id number] DOB: 09-Aug-1965 Today's Date: 10/15/2021   History of present illness 56 y.o. female presented to ED 6/26 from dialysis with increased bloody drainage from L hip wound. 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 s/p hardware removal of left hip with partial resection of tuft tissue and femure that were nonviable on 08/10/21 Discharged home 6/23. underwent L hip debridement and wound vac placement 6/28; +Left intertrochanteric non-union,  Fracture of left distal femur  PMH: hypertension, hyperlipidemia, ESRD on HD MWF, history of left BKA in 2021, depression/anxiety, stroke, tobacco use, T2DM,  insomnia, chronic pain syndrome,   OT comments  Patient sitting on EOB upon entry performing RLE exercises with yellow therapy band. Discussed with patient transferring to recliner and OT/PT returning in an hour to assist back to bed. Patient states that due to upset stomach today was not a good day and to attempt next visit.  Standing performed from EOB with mod/max assist +2 to perform.  Gown change performed seated on EOB. Patient to continue to be followed by acute OT to address transfers into recliner.    Recommendations for follow up therapy are one component of a multi-disciplinary discharge planning process, led by the attending physician.  Recommendations may be updated based on patient status, additional functional criteria and insurance authorization.    Follow Up Recommendations  Skilled nursing-short term rehab (<3 hours/day)    Assistance Recommended at Discharge Intermittent Supervision/Assistance  Patient can return home with the following  A lot of help with bathing/dressing/bathroom;Assistance with cooking/housework;Assist for transportation;Help with stairs or ramp for entrance;Two people to help with walking and/or transfers   Equipment Recommendations  Other (comment)  (TBD)    Recommendations for Other Services      Precautions / Restrictions Precautions Precautions: Fall;Other (comment) Precaution Comments: L BKA (baseline) Restrictions Weight Bearing Restrictions: No LLE Weight Bearing: Weight bearing as tolerated Other Position/Activity Restrictions: L distal femur fx; per Dr [name] 8/10 pt can full WB       Mobility Bed Mobility Overal bed mobility: Needs Assistance Bed Mobility: Supine to Sit, Sit to Supine Rolling: Modified independent (Device/Increase time) Sidelying to sit: HOB elevated, Modified independent (Device/Increase time)   Sit to supine: Modified independent (Device/Increase time)   General bed mobility comments: increased time and effort    Transfers Overall transfer level: Needs assistance Equipment used: Rolling walker (2 wheels), Ambulation equipment used Transfers: Sit to/from Stand Sit to Stand: +2 physical assistance, Max assist, Mod assist           General transfer comment: performed sit to stands x6 from EOB with mod/max assist of 2 to stand. Patient declined getting into recliner due to upset stomach and patient afraid she will need bed pan     Balance Overall balance assessment: Needs assistance Sitting-balance support: Feet supported Sitting balance-Leahy Scale: Good Sitting balance - Comments: no assistance with sitting balance   Standing balance support: Bilateral upper extremity supported, During functional activity, Reliant on assistive device for balance Standing balance-Leahy Scale: Poor Standing balance comment: reliant on RW for support                           ADL either performed or assessed with clinical judgement   ADL Overall ADL's : Needs assistance/impaired                 Upper Body Dressing :  Set up;Sitting Upper Body Dressing Details (indicate cue type and reason): changed gowns                   General ADL Comments: changed gowns seated on EOB     Extremity/Trunk Assessment              Vision       Perception     Praxis      Cognition Arousal/Alertness: Awake/alert Behavior During Therapy: WFL for tasks assessed/performed Overall Cognitive Status: Within Functional Limits for tasks assessed                                          Exercises      Shoulder Instructions       General Comments Pt unable to try donning L prosthesis due to continued edema. Her daughter has not been able to locate her shrinker sock.    Pertinent Vitals/ Pain       Pain Assessment Pain Assessment: Faces Faces Pain Scale: Hurts little more Pain Location: rt knee>lt knee Pain Descriptors / Indicators: Aching, Discomfort, Grimacing, Guarding Pain Intervention(s): Limited activity within patient's tolerance, Monitored during session, Premedicated before session, Repositioned  Home Living                                          Prior Functioning/Environment              Frequency  Min 2X/week        Progress Toward Goals  OT Goals(current goals can now be found in the care plan section)  Progress towards OT goals: Progressing toward goals  Acute Rehab OT Goals Patient Stated Goal: get better OT Goal Formulation: With patient Time For Goal Achievement: 10/22/21 Potential to Achieve Goals: Fair ADL Goals Pt Will Perform Grooming: with modified independence;sitting Pt Will Perform Lower Body Bathing: with supervision;sitting/lateral leans;with adaptive equipment Pt Will Perform Lower Body Dressing: with adaptive equipment;sitting/lateral leans;with supervision Pt Will Transfer to Toilet: with min assist;with +2 assist;bedside commode;with transfer board Pt Will Perform Toileting - Clothing Manipulation and hygiene: with min assist;sitting/lateral leans Pt/caregiver will Perform Home Exercise Program: Increased ROM;Increased strength;Both right and left upper extremity;With written  HEP provided;Independently;With theraband Additional ADL Goal #1: Pt will perform bed mobility modified independently in preparation for ADLs with HOB flat.  Plan Discharge plan remains appropriate;Frequency remains appropriate    Co-evaluation    PT/OT/SLP Co-Evaluation/Treatment: Yes Reason for Co-Treatment: For patient/therapist safety;To address functional/ADL transfers PT goals addressed during session: Mobility/safety with mobility;Balance;Proper use of DME;Strengthening/ROM OT goals addressed during session: Strengthening/ROM      AM-PAC OT "6 Clicks" Daily Activity     Outcome Measure   Help from another person eating meals?: None Help from another person taking care of personal grooming?: A Little Help from another person toileting, which includes using toliet, bedpan, or urinal?: Total Help from another person bathing (including washing, rinsing, drying)?: A Lot Help from another person to put on and taking off regular upper body clothing?: A Little Help from another person to put on and taking off regular lower body clothing?: A Lot 6 Click Score: 15    End of Session Equipment Utilized During Treatment: Gait belt;Rolling walker (2 wheels)  OT Visit Diagnosis: Muscle weakness (generalized) (  M62.81);Unsteadiness on feet (R26.81);History of falling (Z91.81)   Activity Tolerance Patient tolerated treatment well   Patient Left in bed;with call bell/phone within reach;with bed alarm set   Nurse Communication Mobility status        Time: 3007-6226 OT Time Calculation (min): 37 min  Charges: OT General Charges $OT Visit: 1 Visit OT Treatments $Therapeutic Activity: 8-22 mins  [name], [place]  Office [place] 10/15/2021, 2:22 PM

## 2021-10-15 NOTE — Progress Notes (Signed)
Orthopedic Tech Progress Note Patient Details:  [name] [birth year] [id number]  Called in order to [employer] for a SHRINKER, not sure if it's an AKA or a BKA.   Patient ID: [name], female   DOB: 29-Aug-1965, 56 y.o.   MRN: [id number]  Janit Pagan 10/15/2021, 4:16 PM

## 2021-10-15 NOTE — Progress Notes (Signed)
[facility] Progress Note   Subjective:  Seen in room. Had dialysis yesterday with 2.8L UF.  Machine clotted and she signed off early. To work with PT today.   Objective Vitals:   10/14/21 2030 10/14/21 2108 10/15/21 0529 10/15/21 0830  BP: (!) 124/59 135/63 (!) 154/53 (!) 130/51  Pulse: 70 71 64 66  Resp: 12 18 18 18   Temp: 98.3 F (36.8 C) 98.5 F (36.9 C) 98.5 F (36.9 C) 97.8 F (36.6 C)  TempSrc: Oral  Oral Oral  SpO2: 99% 97% 100% 98%  Weight: 91 kg     Height:       Physical Exam General: Well appearing, [name] Heart: RRR, no murmurs, rubs or gallops Lungs: CTA bilaterally without wheezing, rhonchi or rales Abdomen: Soft, non-distended, +BS Extremities: L stump with 1+ edema, trace edema in R leg w/ compression stocking Dialysis Access:  LUE AVF  + bruit  Additional Objective Labs: Basic Metabolic Panel: Recent Labs  Lab 10/11/21 0416  NA 127*  K 5.0  CL 92*  CO2 26  GLUCOSE 120*  BUN 53*  CREATININE 5.94*  CALCIUM 8.8*  PHOS 6.0*    Liver Function Tests: Recent Labs  Lab 10/11/21 0416  ALBUMIN 2.7*    No results for input(s): "LIPASE", "AMYLASE" in the last 168 hours. CBC: Recent Labs  Lab 10/11/21 0416  WBC 8.6  HGB 10.6*  HCT 33.3*  MCV 91.0  PLT 266    Blood Culture    Component Value Date/Time   SDES TISSUE 08/28/2021 0917   SPECREQUEST LEFT THIGH TISSUE 08/28/2021 0917   CULT  08/28/2021 0917    MODERATE ENTEROCOCCUS FAECIUM VANCOMYCIN RESISTANT ENTEROCOCCUS ISOLATED NO ANAEROBES ISOLATED Sent to [place] for further susceptibility testing. Performed at [facility], [place] [address] [place], [place] [address]    REPTSTATUS 09/02/2021 FINAL 08/28/2021 0917    Cardiac Enzymes: Recent Labs  Lab 10/10/21 0433  CKTOTAL 18*    CBG: Recent Labs  Lab 10/13/21 2215 10/14/21 0723 10/14/21 1214 10/14/21 2109 10/15/21 0721  GLUCAP 221* 138* 119* 180* 149*    Iron Studies: No results  for input(s): "IRON", "TIBC", "TRANSFERRIN", "FERRITIN" in the last 72 hours. @lablastinr3 @ Studies/Results: No results found. Medications:  sodium chloride     sodium chloride 10 mL/hr at 09/19/21 1310   methocarbamol (ROBAXIN) IV      alosetron  1 mg Oral BID   amitriptyline  25 mg Oral QHS   amLODipine  10 mg Oral Q24H   atorvastatin  40 mg Oral q1800   carvedilol  6.25 mg Oral BID   cyclobenzaprine  10 mg Oral TID   darbepoetin (ARANESP) injection - DIALYSIS  200 mcg Intravenous Q Fri-HD   docusate sodium  100 mg Oral BID   DULoxetine  60 mg Oral Daily   famotidine  20 mg Oral Daily   furosemide  80 mg Oral Daily   heparin  5,000 Units Subcutaneous Q8H   hydrALAZINE  50 mg Oral TID   insulin aspart  0-6 Units Subcutaneous TID WC   insulin aspart  5 Units Subcutaneous TID WC   insulin glargine-yfgn  12 Units Subcutaneous QHS   multivitamin  1 tablet Oral QHS   pantoprazole  80 mg Oral Daily   sevelamer carbonate  1,600 mg Oral TID WC   sodium chloride flush  3 mL Intravenous Q12H    Dialysis Orders: MWF at [place], 400/500, EDW 90kg, 2K/2Ca, LUE  AVF, heparin 4000 unit bolus  Assessment/Plan: 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 Back to OR 08/28/21, 08/31/21 for L hip debridement and wound vac placement (now removed). Finished 6 week course of IV Daptomycin on 10/12/21. 2. ESRD: Continue HD on MWF schedule. Intermittent AVF cannulation issues but working well recently. Will continue to monitor. Will continue to encourage to sit in chair for HD. Update labs.  3. AVF Access: S/p branch ligation in OR 09/19/2021 per Dr. Virl Cagey.  4. HTN/ volume: BP controlled. On amlodipine/hydralazine/Coreg/Lasix. Still with edema but improved today. Max UF as tolerated and has been reminded of importance of following fluid and sodium restrictions.  5. Anemia of ESRD: Hgb 10.6, continue Aranesp 217mcg q Friday. 6. Secondary HPTH: Cca controlled. Phos  slightly elevated. Continue Renvela as binder. No VDRA for now. 7. DM2: Insulin per primary 8. Dispo: Patient has issues with no handicap ramp at her home. OP HD clinic cannot help her get in and out of her car when she arrives to HD. SNF recommended.  Last admission she was supposed to go to SNF but on day of discharge, she refused SNF placement. Very difficult situation.  Not clear what chance of success she will have as OP regardless of her ability to sit in chair if her home situation is not improved. She says her goal is to be able to walk prior to discharge.   Lynnda Child PA-C [facility] 10/15/2021,9:45 AM

## 2021-10-16 DIAGNOSIS — M86252 Subacute osteomyelitis, left femur: Secondary | ICD-10-CM | POA: Diagnosis not present

## 2021-10-16 DIAGNOSIS — K2100 Gastro-esophageal reflux disease with esophagitis, without bleeding: Secondary | ICD-10-CM

## 2021-10-16 DIAGNOSIS — T148XXA Other injury of unspecified body region, initial encounter: Secondary | ICD-10-CM | POA: Diagnosis not present

## 2021-10-16 DIAGNOSIS — F419 Anxiety disorder, unspecified: Secondary | ICD-10-CM | POA: Diagnosis not present

## 2021-10-16 DIAGNOSIS — F17200 Nicotine dependence, unspecified, uncomplicated: Secondary | ICD-10-CM

## 2021-10-16 LAB — GLUCOSE, CAPILLARY
Glucose-Capillary: 258 mg/dL — ABNORMAL HIGH (ref 70–99)
Glucose-Capillary: 97 mg/dL (ref 70–99)

## 2021-10-16 LAB — RENAL FUNCTION PANEL
Albumin: 2.7 g/dL — ABNORMAL LOW (ref 3.5–5.0)
Anion gap: 12 (ref 5–15)
BUN: 43 mg/dL — ABNORMAL HIGH (ref 6–20)
CO2: 26 mmol/L (ref 22–32)
Calcium: 9.1 mg/dL (ref 8.9–10.3)
Chloride: 91 mmol/L — ABNORMAL LOW (ref 98–111)
Creatinine, Ser: 6 mg/dL — ABNORMAL HIGH (ref 0.44–1.00)
GFR, Estimated: 8 mL/min — ABNORMAL LOW (ref >60)
Glucose, Bld: 108 mg/dL — ABNORMAL HIGH (ref 70–99)
Phosphorus: 5.9 mg/dL — ABNORMAL HIGH (ref 2.5–4.6)
Potassium: 5.1 mmol/L (ref 3.5–5.1)
Sodium: 129 mmol/L — ABNORMAL LOW (ref 135–145)

## 2021-10-16 LAB — CBC
HCT: 36.9 % (ref 36.0–46.0)
Hemoglobin: 11.5 g/dL — ABNORMAL LOW (ref 12.0–15.0)
MCH: 28.8 pg (ref 26.0–34.0)
MCHC: 31.2 g/dL (ref 30.0–36.0)
MCV: 92.3 fL (ref 80.0–100.0)
Platelets: 229 10*3/uL (ref 150–400)
RBC: 4 MIL/uL (ref 3.87–5.11)
RDW: 16.7 % — ABNORMAL HIGH (ref 11.5–15.5)
WBC: 7.9 10*3/uL (ref 4.0–10.5)
nRBC: 0 % (ref 0.0–0.2)

## 2021-10-16 MED ORDER — PENTAFLUOROPROP-TETRAFLUOROETH EX AERO: 1.0000 | INHALATION_SPRAY | CUTANEOUS | Status: DC | PRN

## 2021-10-16 MED ORDER — HEPARIN SODIUM (PORCINE) 1000 UNIT/ML DIALYSIS
5000.0000 [IU] | Freq: Once | INTRAMUSCULAR | Status: AC
  Administered 2021-10-16: 5000 [IU] via INTRAVENOUS_CENTRAL
  Filled 2021-10-16: qty 5

## 2021-10-16 NOTE — Progress Notes (Signed)
PT STABLE FOR HD IN BED NO C/OS VITALS STABLE AVG +/+ UFG 4L

## 2021-10-16 NOTE — Progress Notes (Signed)
Received patient in bed to unit.  Alert and oriented.  Informed consent signed and in  chart.   Treatment initiated: 0815 Treatment completed: 8485  Patient tolerated well.  Transported back to the room  alert, without acute distress.  Hand-off given to patient's nurse.   Access used: AVF Access issues: MULTIPLE CANNULATION DUE TO CLOTTING  Total UF removed: 4L Medication(s) given: NONE Post HD VS: 128/61 69 16 98.4 Post HD weight: 88.3KG   [facility] T Dougles [facility]

## 2021-10-16 NOTE — Progress Notes (Signed)
[facility] Progress Note   Subjective: Patient seen and examined on HD. In bed. Tolerating treatment thus far, UFG 4L, VSS.  Objective Vitals:   10/16/21 0815 10/16/21 0830 10/16/21 0900 10/16/21 0930  BP: 125/62 (!) 110/58 (!) 129/58 129/71  Pulse: 62 61 62 65  Resp: 12 16 14 15   Temp:      TempSrc:      SpO2: 98% 100% 97% 95%  Weight:      Height:       Physical Exam General: Well appearing, [name], laying flat in bed Heart: RRR, no murmurs, rubs or gallops Lungs: CTA bilaterally without wheezing, rhonchi or rales Abdomen: Soft, non-distended, +BS Extremities: L stump with 1+ edema, trace edema in R leg w/ compression stocking in place Dialysis Access:  LUE AVF  + bruit (in use)  Additional Objective Labs: Basic Metabolic Panel: Recent Labs  Lab 10/11/21 0416 10/16/21 0608  NA 127* 129*  K 5.0 5.1  CL 92* 91*  CO2 26 26  GLUCOSE 120* 108*  BUN 53* 43*  CREATININE 5.94* 6.00*  CALCIUM 8.8* 9.1  PHOS 6.0* 5.9*   Liver Function Tests: Recent Labs  Lab 10/11/21 0416 10/16/21 0608  ALBUMIN 2.7* 2.7*   No results for input(s): "LIPASE", "AMYLASE" in the last 168 hours. CBC: Recent Labs  Lab 10/11/21 0416 10/16/21 0608  WBC 8.6 7.9  HGB 10.6* 11.5*  HCT 33.3* 36.9  MCV 91.0 92.3  PLT 266 229   Blood Culture    Component Value Date/Time   SDES TISSUE 08/28/2021 0917   SPECREQUEST LEFT THIGH TISSUE 08/28/2021 0917   CULT  08/28/2021 0917    MODERATE ENTEROCOCCUS FAECIUM VANCOMYCIN RESISTANT ENTEROCOCCUS ISOLATED NO ANAEROBES ISOLATED Sent to Home for further susceptibility testing. Performed at [facility], [place] [address], [place], [place] [address]    REPTSTATUS 09/02/2021 FINAL 08/28/2021 0917    Cardiac Enzymes: Recent Labs  Lab 10/10/21 0433  CKTOTAL 18*   CBG: Recent Labs  Lab 10/14/21 2109 10/15/21 0721 10/15/21 1208 10/15/21 1648 10/15/21 2128  GLUCAP 180* 149* 154* 137* 135*   Iron  Studies: No results for input(s): "IRON", "TIBC", "TRANSFERRIN", "FERRITIN" in the last 72 hours. @lablastinr3 @ Studies/Results: No results found. Medications:  sodium chloride     sodium chloride 10 mL/hr at 09/19/21 1310   methocarbamol (ROBAXIN) IV      alosetron  1 mg Oral BID   amitriptyline  25 mg Oral QHS   amLODipine  10 mg Oral Q24H   atorvastatin  40 mg Oral q1800   carvedilol  6.25 mg Oral BID   Chlorhexidine Gluconate Cloth  6 each Topical Q0600   cyclobenzaprine  10 mg Oral TID   darbepoetin (ARANESP) injection - DIALYSIS  200 mcg Intravenous Q Fri-HD   docusate sodium  100 mg Oral BID   DULoxetine  60 mg Oral Daily   famotidine  20 mg Oral Daily   furosemide  80 mg Oral Daily   heparin  5,000 Units Subcutaneous Q8H   hydrALAZINE  50 mg Oral TID   insulin aspart  0-6 Units Subcutaneous TID WC   insulin aspart  5 Units Subcutaneous TID WC   insulin glargine-yfgn  12 Units Subcutaneous QHS   multivitamin  1 tablet Oral QHS   pantoprazole  80 mg Oral Daily   sevelamer carbonate  1,600 mg Oral TID WC   sodium chloride flush  3 mL Intravenous Q12H    Dialysis  Orders: MWF at [place], 400/500, EDW 90kg, 2K/2Ca, LUE AVF, heparin 4000 unit bolus  Assessment/Plan: 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 Back to OR 08/28/21, 08/31/21 for L hip debridement and wound vac placement (now removed). Finished 6 week course of IV Daptomycin on 10/12/21. 2. ESRD: Continue HD on MWF schedule. Intermittent AVF cannulation issues but working well recently. Will continue to monitor. Will continue to encourage to sit in chair for HD. Update labs.  3. AVF Access: S/p branch ligation in OR 09/19/2021 per Dr. Virl Cagey.  4. HTN/ volume: BP controlled. On amlodipine/hydralazine/Coreg/Lasix. Still with edema but improved today. Max UF as tolerated and has been reminded of importance of following fluid and sodium restrictions.  5. 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 6. Secondary HPTH: Cca controlled. Phos slightly elevated. Continue Renvela as binder. No VDRA for now. PO4 5.9 today 7. DM2: Insulin per primary 8. Dispo: Patient has issues with no handicap ramp at her home. OP HD clinic cannot help her get in and out of her car when she arrives to HD. SNF recommended.  Last admission she was supposed to go to SNF but on day of discharge, she refused SNF placement. Very difficult situation.  Not clear what chance of success she will have as OP regardless of her ability to sit in chair if her home situation is not improved. She says her goal is to be able to walk prior to discharge.   Gean Quint, MD [facility]

## 2021-10-16 NOTE — Progress Notes (Signed)
PROGRESS NOTE    [name]  GYK:[id number] DOB: 1965/09/20 DOA: 08/26/2021 PCP: [name], MD    Chief Complaint  Patient presents with   Wound Infection    Brief Narrative:  [name] was admitted to the hospital with the working diagnosis of subacute osteomyelitis left intertrochanteric fracture.    56 year old woman PMH including ESRD, complicated left hip and femur issues requiring multiple surgeries, removal of hardware presented 6/26 with bloody drainage from surgical incision.  Seen by orthopedics and underwent I&D 6/28 and 7/1.  Currently unable to progress care secondary to refusal to sit in chair for HD.     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  Active Problems:   ESRD (end stage renal disease) on dialysis (HCC)   Type 2 diabetes mellitus with hyperlipidemia (HCC)   History of CVA (cerebrovascular accident)   Essential hypertension   Anxiety and depression   GASTROESOPHAGEAL REFLUX, NO ESOPHAGITIS   TOBACCO DEPENDENCE   Wound infection   Wound dehiscence, surgical, sequela  #1 subacute osteomyelitis left femur with abscess/infected left hip hardware status post removal/left intertrochanteric fracture -Patient seen and followed by orthopedics during the hospitalization, patient underwent left hip debridement, application of wound VAC by Dr. Gavin Potters 08/28/2021. -Status post wound VAC removal 10/05/18/2023 per orthopedics. -Continue wound care. -Patient cleared by orthopedics from their standpoint for discharge with outpatient follow-up. -Patient seen by ID and daptomycin x6 weeks recommended with hemodialysis with stop date 10/13/18/2023 which has been completed. -Outpatient follow-up with Dr. Candiss Norse, ID on discharge. -Patient seen later on during the hospitalization by Dr. [name] who recommended lymphedema pump as outpatient, has pump cannot be arranged in-house. -Generalized edema improving with  dialysis. -Outpatient follow-up with orthopedics, ID.  2.  ESRD on dialysis -Patient on HD Monday Wednesday Friday needs to be able to sit in the chair to tolerate outpatient dialysis prior to discharge. -Patient seen by vascular surgery status post fistula revision and branch ligation. -Patient could not tolerate HD. -Case discussed with nephrology who recommended SNF as patient would not be safe for home DC (no ramp to get into home) -Continue current fluid restrictions, concerns regarding compliance with fluid intake. -Patient noted on HD on 8/7 to have brief run of 2 hours, reported mention problems asked to be taken off. -It is noted per Dr. Herbie Baltimore note that patient stated she is still not ready to have HD in the chair for several complaints including pain, fluid and weakness. -Currently on Lasix 80 mg daily. -On hemodialysis. -Nephrology following and per nephrology patient with issues with no handicap ramp at home, outpatient HD clinic cannot help her get in and out of the car when she arrives to HD.  They indicate is not clear what chance of success she will have in the outpatient regardless of her ability to sit in chair if her home situation is not improved. -Likely needs SNF placement. -On HD. -Per nephrology.  3.  Anasarca -Secondary to ESRD and volume overload. -Volume management per HD. -Volume overload improving on dialysis. -2D echo on 10/09/2021 with EF of 60 to 65%, NWMA. -Continue HD per nephrology.  4.  Hyponatremia -Being managed on HD. -Per nephrology.  5.  Hyperkalemia -Improving on HD.  6.  Anemia of chronic disease/CKD, component of acute blood loss anemia from wound -H&H stable. - Aranesp per nephrology.  7.  Uncontrolled type 2 diabetes with hyperglycemia -A1c 7.1 07/2021 -Continue Semglee, NovoLog, SSI.  8.  Hypertension -Continue Norvasc, Coreg, hydralazine, Lasix.  9.  Hyperlipidemia -Lipitor.  10.  History of CVA -Statin.  11.   Depression/anxiety -Cymbalta/Elavil.  12.  GERD -Dexilant  13.  Tobacco dependence -Tobacco cessation. -Patient noted to have declined nicotine patch.   DVT prophylaxis: Heparin Code Status: Full Family Communication: Updated patient.  No family at bedside. Disposition: TBD  Status is: Inpatient Remains inpatient appropriate because: Severity of illness/   Consultants:  Orthopedics: Dr. [name] 08/27/2021 ID: Dr. Gale Journey 08/27/2021 Nephrology: Dr.Schertz 08/27/2021 Vascular surgery: Dr. Unk Lightning 09/06/2021   Procedures:  CT left hip 08/26/2021 Plain films of the right knee 08/26/2021 Plain films of the right foot 08/26/2021 2D echo 10/09/2021 Vascular ultrasound duplex dialysis access 09/07/2021 Fistula revision branch ligation per Dr. Unk Lightning 09/19/2021- Left hip debridement, application of wound VAC by Dr. [name] 08/28/2021  Antimicrobials:  Anti-infectives (From admission, onward)    Start     Dose/Rate Route Frequency Ordered Stop   10/02/21 2000  DAPTOmycin (CUBICIN) 900 mg in sodium chloride 0.9 % IVPB  Status:  Discontinued        10 mg/kg  92.4 kg 136 mL/hr over 30 Minutes Intravenous Once per day on Mon Wed Fri 10/01/21 1350 10/12/21 1146   09/30/21 2000  DAPTOmycin (CUBICIN) 900 mg in sodium chloride 0.9 % IVPB  Status:  Discontinued        10 mg/kg  92.4 kg 136 mL/hr over 30 Minutes Intravenous Daily 09/28/21 0919 10/01/21 1350   09/28/21 2000  DAPTOmycin (CUBICIN) 900 mg in sodium chloride 0.9 % IVPB        900 mg 136 mL/hr over 30 Minutes Intravenous  Once 09/28/21 0919 09/28/21 2146   09/02/21 2000  DAPTOmycin (CUBICIN) 900 mg in sodium chloride 0.9 % IVPB  Status:  Discontinued        10 mg/kg  92.4 kg 136 mL/hr over 30 Minutes Intravenous Once per day on Mon Wed Fri 09/02/21 0836 09/28/21 0919   08/31/21 0745  ceFAZolin (ANCEF) IVPB 2g/100 mL premix        2 g 200 mL/hr over 30 Minutes Intravenous To Short Stay 08/31/21 0730 08/31/21 0918   08/30/21 0600  ceFAZolin  (ANCEF) IVPB 2g/100 mL premix  Status:  Discontinued        2 g 200 mL/hr over 30 Minutes Intravenous To Short Stay 08/30/21 0123 08/31/21 0600   08/29/21 2000  DAPTOmycin (CUBICIN) 900 mg in sodium chloride 0.9 % IVPB  Status:  Discontinued        10 mg/kg  92.4 kg 136 mL/hr over 30 Minutes Intravenous Every 48 hours 08/29/21 1627 09/02/21 0836   08/28/21 1800  ceFAZolin (ANCEF) IVPB 2g/100 mL premix  Status:  Discontinued        2 g 200 mL/hr over 30 Minutes Intravenous Every M-W-F (1800) 08/27/21 0846 08/29/21 1620   08/28/21 1200  ceFAZolin (ANCEF) IVPB 1 g/50 mL premix  Status:  Discontinued        1 g 100 mL/hr over 30 Minutes Intravenous Every M-W-F (Hemodialysis) 08/26/21 1954 08/27/21 0846   08/28/21 0600  ceFAZolin (ANCEF) IVPB 2g/100 mL premix        2 g 200 mL/hr over 30 Minutes Intravenous On call to O.R. 08/27/21 1150 08/28/21 0909   08/27/21 1800  ceFAZolin (ANCEF) IVPB 1 g/50 mL premix        1 g 100 mL/hr over 30 Minutes Intravenous  Once 08/26/21 1954 08/27/21 2048   08/26/21 1500  ceFEPIme (MAXIPIME) 1 g in sodium chloride 0.9 % 100 mL IVPB  Status:  Discontinued        1 g 200 mL/hr over 30 Minutes Intravenous Every 24 hours 08/26/21 1452 08/26/21 1714         Subjective: Patient in HD. No CP, No SOB. States some improvement with swelling.  Overall feeling better.  Objective: Vitals:   10/16/21 1030 10/16/21 1100 10/16/21 1130 10/16/21 1200  BP: (!) 137/57 (!) 135/59 118/61 113/61  Pulse: 65 63 65 67  Resp: 17 (!) 8 16 12   Temp:      TempSrc:      SpO2: 96% 97% 100% 96%  Weight:      Height:        Intake/Output Summary (Last 24 hours) at 10/16/2021 1237 Last data filed at 10/15/2021 2200 Gross per 24 hour  Intake 663 ml  Output --  Net 663 ml   Filed Weights   10/11/21 1240 10/14/21 2030 10/16/21 0810  Weight: 86.3 kg 91 kg 92.6 kg    Examination:  General exam: Appears calm and comfortable  Respiratory system: CTAB anterior lung fields.no  wheezes, no crackles, no rhonchi.  Normal respiratory effort.  Cardiovascular system: S1 & S2 heard, RRR. No JVD, murmurs, rubs, gallops or clicks. No pedal edema. Gastrointestinal system: Abdomen is nondistended, soft and nontender. No organomegaly or masses felt. Normal bowel sounds heard. Central nervous system: Alert and oriented. No focal neurological deficits. Extremities: Symmetric 5 x 5 power.s/p L BKA. LLE with some edema. RLE with trace edema Skin: No rashes, lesions or ulcers Psychiatry: Judgement and insight appear normal. Mood & affect appropriate.     Data Reviewed: I have personally reviewed following labs and imaging studies  CBC: Recent Labs  Lab 10/11/21 0416 10/16/21 0608  WBC 8.6 7.9  HGB 10.6* 11.5*  HCT 33.3* 36.9  MCV 91.0 92.3  PLT 266 299    Basic Metabolic Panel: Recent Labs  Lab 10/11/21 0416 10/16/21 0608  NA 127* 129*  K 5.0 5.1  CL 92* 91*  CO2 26 26  GLUCOSE 120* 108*  BUN 53* 43*  CREATININE 5.94* 6.00*  CALCIUM 8.8* 9.1  PHOS 6.0* 5.9*    GFR: Estimated Creatinine Clearance: 13.2 mL/min (A) (by C-G formula based on SCr of 6 mg/dL (H)).  Liver Function Tests: Recent Labs  Lab 10/11/21 0416 10/16/21 0608  ALBUMIN 2.7* 2.7*    CBG: Recent Labs  Lab 10/14/21 2109 10/15/21 0721 10/15/21 1208 10/15/21 1648 10/15/21 2128  GLUCAP 180* 149* 154* 137* 135*     No results found for this or any previous visit (from the past 240 hour(s)).       Radiology Studies: No results found.      Scheduled Meds:  alosetron  1 mg Oral BID   amitriptyline  25 mg Oral QHS   amLODipine  10 mg Oral Q24H   atorvastatin  40 mg Oral q1800   carvedilol  6.25 mg Oral BID   Chlorhexidine Gluconate Cloth  6 each Topical Q0600   cyclobenzaprine  10 mg Oral TID   darbepoetin (ARANESP) injection - DIALYSIS  200 mcg Intravenous Q Fri-HD   docusate sodium  100 mg Oral BID   DULoxetine  60 mg Oral Daily   famotidine  20 mg Oral Daily    furosemide  80 mg Oral Daily   heparin  5,000 Units Subcutaneous Q8H   hydrALAZINE  50 mg Oral TID   insulin aspart  0-6 Units Subcutaneous TID WC   insulin aspart  5 Units Subcutaneous TID WC   insulin glargine-yfgn  12 Units Subcutaneous QHS   multivitamin  1 tablet Oral QHS   pantoprazole  80 mg Oral Daily   sevelamer carbonate  1,600 mg Oral TID WC   sodium chloride flush  3 mL Intravenous Q12H   Continuous Infusions:  sodium chloride     sodium chloride 10 mL/hr at 09/19/21 1310   methocarbamol (ROBAXIN) IV       LOS: 51 days    Time spent: 40 minutes    Irine Seal, MD [facility]   To contact the attending provider between 7A-7P or the covering provider during after hours 7P-7A, please log into the web site [email or web address] and access using universal [place] password for that web site. If you do not have the password, please call the hospital operator.  10/16/2021, 12:37 PM

## 2021-10-17 DIAGNOSIS — T148XXA Other injury of unspecified body region, initial encounter: Secondary | ICD-10-CM | POA: Diagnosis not present

## 2021-10-17 DIAGNOSIS — M86252 Subacute osteomyelitis, left femur: Secondary | ICD-10-CM | POA: Diagnosis not present

## 2021-10-17 DIAGNOSIS — K2100 Gastro-esophageal reflux disease with esophagitis, without bleeding: Secondary | ICD-10-CM | POA: Diagnosis not present

## 2021-10-17 DIAGNOSIS — F419 Anxiety disorder, unspecified: Secondary | ICD-10-CM | POA: Diagnosis not present

## 2021-10-17 LAB — RENAL FUNCTION PANEL
Albumin: 2.8 g/dL — ABNORMAL LOW (ref 3.5–5.0)
Anion gap: 9 (ref 5–15)
BUN: 26 mg/dL — ABNORMAL HIGH (ref 6–20)
CO2: 28 mmol/L (ref 22–32)
Calcium: 9 mg/dL (ref 8.9–10.3)
Chloride: 94 mmol/L — ABNORMAL LOW (ref 98–111)
Creatinine, Ser: 4.54 mg/dL — ABNORMAL HIGH (ref 0.44–1.00)
GFR, Estimated: 11 mL/min — ABNORMAL LOW (ref >60)
Glucose, Bld: 121 mg/dL — ABNORMAL HIGH (ref 70–99)
Phosphorus: 5.6 mg/dL — ABNORMAL HIGH (ref 2.5–4.6)
Potassium: 4.8 mmol/L (ref 3.5–5.1)
Sodium: 131 mmol/L — ABNORMAL LOW (ref 135–145)

## 2021-10-17 LAB — GLUCOSE, CAPILLARY
Glucose-Capillary: 138 mg/dL — ABNORMAL HIGH (ref 70–99)
Glucose-Capillary: 248 mg/dL — ABNORMAL HIGH (ref 70–99)
Glucose-Capillary: 131 mg/dL — ABNORMAL HIGH (ref 70–99)
Glucose-Capillary: 246 mg/dL — ABNORMAL HIGH (ref 70–99)

## 2021-10-17 MED ORDER — HYDRALAZINE HCL 25 MG PO TABS
25.0000 mg | ORAL_TABLET | Freq: Three times a day (TID) | ORAL | Status: DC
  Administered 2021-10-17 – 2021-12-15 (×151): 25 mg via ORAL
  Filled 2021-10-17 (×158): qty 1

## 2021-10-17 NOTE — Progress Notes (Addendum)
[facility] Progress Note   Subjective:   Seen in room. No new concerns this am. Tolerating UF with HD, but still refusing recliner for long periods.   Objective Vitals:   10/16/21 2058 10/17/21 0522 10/17/21 0828 10/17/21 0935  BP: (!) 118/94 (!) 126/53 (!) 143/64 (!) 140/58  Pulse: 71 64 71 67  Resp: 18 18 18 17   Temp: 98.8 F (37.1 C) 98.4 F (36.9 C) 98.1 F (36.7 C) 98.3 F (36.8 C)  TempSrc: Oral Oral Oral   SpO2: 98% 98% 97% 100%  Weight:      Height:       Physical Exam General: Well appearing, [name], laying flat in bed Heart: RRR, no murmurs, rubs or gallops Lungs: CTA bilaterally without wheezing, rhonchi or rales Abdomen: Soft, non-distended, +BS Extremities: L stump with 1+ edema, trace edema in R leg w/ compression stocking in place Dialysis Access:  LUE AVF  + bruit (in use)  Additional Objective Labs: Basic Metabolic Panel: Recent Labs  Lab 10/11/21 0416 10/16/21 0608 10/17/21 0545  NA 127* 129* 131*  K 5.0 5.1 4.8  CL 92* 91* 94*  CO2 26 26 28   GLUCOSE 120* 108* 121*  BUN 53* 43* 26*  CREATININE 5.94* 6.00* 4.54*  CALCIUM 8.8* 9.1 9.0  PHOS 6.0* 5.9* 5.6*    Liver Function Tests: Recent Labs  Lab 10/11/21 0416 10/16/21 0608 10/17/21 0545  ALBUMIN 2.7* 2.7* 2.8*    No results for input(s): "LIPASE", "AMYLASE" in the last 168 hours. CBC: Recent Labs  Lab 10/11/21 0416 10/16/21 0608  WBC 8.6 7.9  HGB 10.6* 11.5*  HCT 33.3* 36.9  MCV 91.0 92.3  PLT 266 229    Blood Culture    Component Value Date/Time   SDES TISSUE 08/28/2021 0917   SPECREQUEST LEFT THIGH TISSUE 08/28/2021 0917   CULT  08/28/2021 0917    MODERATE ENTEROCOCCUS FAECIUM VANCOMYCIN RESISTANT ENTEROCOCCUS ISOLATED NO ANAEROBES ISOLATED Sent to [place] for further susceptibility testing. Performed at [facility], [place] [address], [place], [place] [address]    REPTSTATUS 09/02/2021 FINAL 08/28/2021 8546    Cardiac  Enzymes: No results for input(s): "CKTOTAL", "CKMB", "CKMBINDEX", "TROPONINI" in the last 168 hours.  CBG: Recent Labs  Lab 10/15/21 1648 10/15/21 2128 10/16/21 1706 10/16/21 2057 10/17/21 0732  GLUCAP 137* 135* 258* 97 138*    Iron Studies: No results for input(s): "IRON", "TIBC", "TRANSFERRIN", "FERRITIN" in the last 72 hours. @lablastinr3 @ Studies/Results: No results found. Medications:  sodium chloride     sodium chloride 10 mL/hr at 09/19/21 1310   methocarbamol (ROBAXIN) IV      alosetron  1 mg Oral BID   amitriptyline  25 mg Oral QHS   amLODipine  10 mg Oral Q24H   atorvastatin  40 mg Oral q1800   carvedilol  6.25 mg Oral BID   Chlorhexidine Gluconate Cloth  6 each Topical Q0600   cyclobenzaprine  10 mg Oral TID   darbepoetin (ARANESP) injection - DIALYSIS  200 mcg Intravenous Q Fri-HD   docusate sodium  100 mg Oral BID   DULoxetine  60 mg Oral Daily   famotidine  20 mg Oral Daily   furosemide  80 mg Oral Daily   heparin  5,000 Units Subcutaneous Q8H   hydrALAZINE  50 mg Oral TID   insulin aspart  0-6 Units Subcutaneous TID WC   insulin aspart  5 Units Subcutaneous TID WC   insulin glargine-yfgn  12 Units  Subcutaneous QHS   multivitamin  1 tablet Oral QHS   pantoprazole  80 mg Oral Daily   sevelamer carbonate  1,600 mg Oral TID WC   sodium chloride flush  3 mL Intravenous Q12H    Dialysis Orders: MWF at [place], 400/500, EDW 90kg, 2K/2Ca, LUE AVF, heparin 4000 unit bolus  Assessment/Plan: 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 Back to OR 08/28/21, 08/31/21 for L hip debridement and wound vac placement (now removed). Finished 6 week course of IV Daptomycin on 10/12/21. 2. ESRD: Continue HD on MWF schedule. Intermittent AVF cannulation issues but working well recently. Will continue to monitor. Will continue to encourage to sit in chair for HD. Update labs.  3. AVF Access: S/p branch ligation in OR 09/19/2021 per Dr. Virl Cagey.   4. HTN/ volume: BP controlled. On amlodipine/hydralazine/Coreg/Lasix. Still with edema but improved today. Max UF as tolerated and has been reminded of importance of following fluid and sodium restrictions.  5. 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 Hold next Aranesp dose.  6. Secondary HPTH: Cca controlled. Phos slightly elevated. Continue Renvela as binder. No VDRA for now. 7. DM2: Insulin per primary 8. Dispo: SNF recommended.  Per PT notes -now willing to go to SNF. Very difficult situation.  Not clear what chance of success she will have as OP regardless of her ability to sit in chair if her home situation is not improved. She says her goal is to be able to walk prior to discharge.   Lynnda Child PA-C [facility] 10/17/2021,10:17 AM

## 2021-10-17 NOTE — Progress Notes (Signed)
PROGRESS NOTE    [name]  KKX:[id number] DOB: Jul 25, 1965 DOA: 08/26/2021 PCP: [name], MD    Chief Complaint  Patient presents with   Wound Infection    Brief Narrative:  [name] was admitted to the hospital with the working diagnosis of subacute osteomyelitis left intertrochanteric fracture.    56 year old woman PMH including ESRD, complicated left hip and femur issues requiring multiple surgeries, removal of hardware presented 6/26 with bloody drainage from surgical incision.  Seen by orthopedics and underwent I&D 6/28 and 7/1.  Currently unable to progress care secondary to refusal to sit in chair for HD.     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  Active Problems:   ESRD (end stage renal disease) on dialysis (HCC)   Type 2 diabetes mellitus with hyperlipidemia (HCC)   History of CVA (cerebrovascular accident)   Essential hypertension   Anxiety and depression   GASTROESOPHAGEAL REFLUX, NO ESOPHAGITIS   TOBACCO DEPENDENCE   Wound infection   Wound dehiscence, surgical, sequela  #1 subacute osteomyelitis left femur with abscess/infected left hip hardware status post removal/left intertrochanteric fracture -Patient seen and followed by orthopedics during the hospitalization, patient underwent left hip debridement, application of wound VAC by Dr. Gavin Potters 08/28/2021. -Status post wound VAC removal 10/05/18/2023 per orthopedics. -Continue wound care. -Patient cleared by orthopedics from their standpoint for discharge with outpatient follow-up. -Patient seen by ID and daptomycin x6 weeks recommended with hemodialysis with stop date 10/13/18/2023 which has been completed. -Outpatient follow-up with Dr. Candiss Norse, ID on discharge. -Patient seen later on during the hospitalization by Dr. [name] who recommended lymphedema pump as outpatient, has pump cannot be arranged in-house. -Generalized edema improving with  dialysis. -Outpatient follow-up with orthopedics, ID.  2.  ESRD on dialysis -Patient on HD Monday Wednesday Friday needs to be able to sit in the chair to tolerate outpatient dialysis prior to discharge. -Patient seen by vascular surgery status post fistula revision and branch ligation. -Patient could not tolerate HD. -Case discussed with nephrology who recommended SNF as patient would not be safe for home DC (no ramp to get into home) -Continue current fluid restrictions, concerns regarding compliance with fluid intake. -Patient noted on HD on 8/7 to have brief run of 2 hours, reported mention problems asked to be taken off. -It is noted per Dr. Rhetta Mura note that patient stated she is still not ready to have HD in the chair for several complaints including pain, fluid and weakness. -Currently on Lasix 80 mg daily. -On hemodialysis. -Nephrology following and per nephrology patient with issues with no handicap ramp at home, outpatient HD clinic cannot help her get in and out of the car when she arrives to HD.  They indicate is not clear what chance of success she will have in the outpatient regardless of her ability to sit in chair if her home situation is not improved. -Likely needs SNF placement. -Patient in agreement to sit up in chair for trial of 2 hours today. -On HD. -Per nephrology.  3.  Anasarca -Secondary to ESRD and volume overload. -Volume management per HD. -Volume overload improving on dialysis. -2D echo on 10/09/2021 with EF of 60 to 65%, NWMA. -Continue HD per nephrology.  4.  Hyponatremia -Being managed on HD. -Per nephrology.  5.  Hyperkalemia -Improved on HD.  6.  Anemia of chronic disease/CKD, component of acute blood loss anemia from wound -H&H stable. - Aranesp per nephrology.  7.  Uncontrolled type 2 diabetes with hyperglycemia -A1c 7.1 07/2021 -CBG 138 this morning. -Continue Semglee, NovoLog, SSI.  8.  Hypertension -Continue Norvasc, Coreg,  Lasix. -Patient with soft blood pressure this afternoon and as such afternoon dose hydralazine held. -Decrease hydralazine to 25 3 times daily.  9.  Hyperlipidemia -Continue Lipitor.  10.  History of CVA -Continue statin.  11.  Depression/anxiety -Stable.   -Continue Cymbalta/Elavil.  12.  GERD -Continue PPI, Pepcid.   13.  Tobacco dependence -Tobacco cessation. -Patient noted to have declined nicotine patch.   DVT prophylaxis: Heparin Code Status: Full Family Communication: Updated patient.  No family at bedside. Disposition: TBD  Status is: Inpatient Remains inpatient appropriate because: Severity of illness/   Consultants:  Orthopedics: Dr. [name] 08/27/2021 ID: Dr. Gale Journey 08/27/2021 Nephrology: Dr.Schertz 08/27/2021 Vascular surgery: Dr. Unk Lightning 09/06/2021   Procedures:  CT left hip 08/26/2021 Plain films of the right knee 08/26/2021 Plain films of the right foot 08/26/2021 2D echo 10/09/2021 Vascular ultrasound duplex dialysis access 09/07/2021 Fistula revision branch ligation per Dr. Unk Lightning 09/19/2021- Left hip debridement, application of wound VAC by Dr. [name] 08/28/2021  Antimicrobials:  Anti-infectives (From admission, onward)    Start     Dose/Rate Route Frequency Ordered Stop   10/02/21 2000  DAPTOmycin (CUBICIN) 900 mg in sodium chloride 0.9 % IVPB  Status:  Discontinued        10 mg/kg  92.4 kg 136 mL/hr over 30 Minutes Intravenous Once per day on Mon Wed Fri 10/01/21 1350 10/12/21 1146   09/30/21 2000  DAPTOmycin (CUBICIN) 900 mg in sodium chloride 0.9 % IVPB  Status:  Discontinued        10 mg/kg  92.4 kg 136 mL/hr over 30 Minutes Intravenous Daily 09/28/21 0919 10/01/21 1350   09/28/21 2000  DAPTOmycin (CUBICIN) 900 mg in sodium chloride 0.9 % IVPB        900 mg 136 mL/hr over 30 Minutes Intravenous  Once 09/28/21 0919 09/28/21 2146   09/02/21 2000  DAPTOmycin (CUBICIN) 900 mg in sodium chloride 0.9 % IVPB  Status:  Discontinued        10 mg/kg  92.4 kg 136  mL/hr over 30 Minutes Intravenous Once per day on Mon Wed Fri 09/02/21 0836 09/28/21 0919   08/31/21 0745  ceFAZolin (ANCEF) IVPB 2g/100 mL premix        2 g 200 mL/hr over 30 Minutes Intravenous To Short Stay 08/31/21 0730 08/31/21 0918   08/30/21 0600  ceFAZolin (ANCEF) IVPB 2g/100 mL premix  Status:  Discontinued        2 g 200 mL/hr over 30 Minutes Intravenous To Short Stay 08/30/21 0123 08/31/21 0600   08/29/21 2000  DAPTOmycin (CUBICIN) 900 mg in sodium chloride 0.9 % IVPB  Status:  Discontinued        10 mg/kg  92.4 kg 136 mL/hr over 30 Minutes Intravenous Every 48 hours 08/29/21 1627 09/02/21 0836   08/28/21 1800  ceFAZolin (ANCEF) IVPB 2g/100 mL premix  Status:  Discontinued        2 g 200 mL/hr over 30 Minutes Intravenous Every M-W-F (1800) 08/27/21 0846 08/29/21 1620   08/28/21 1200  ceFAZolin (ANCEF) IVPB 1 g/50 mL premix  Status:  Discontinued        1 g 100 mL/hr over 30 Minutes Intravenous Every M-W-F (Hemodialysis) 08/26/21 1954 08/27/21 0846   08/28/21 0600  ceFAZolin (ANCEF) IVPB 2g/100 mL premix        2 g 200 mL/hr over 30  Minutes Intravenous On call to O.R. 08/27/21 1150 08/28/21 0909   08/27/21 1800  ceFAZolin (ANCEF) IVPB 1 g/50 mL premix        1 g 100 mL/hr over 30 Minutes Intravenous  Once 08/26/21 1954 08/27/21 2048   08/26/21 1500  ceFEPIme (MAXIPIME) 1 g in sodium chloride 0.9 % 100 mL IVPB  Status:  Discontinued        1 g 200 mL/hr over 30 Minutes Intravenous Every 24 hours 08/26/21 1452 08/26/21 1714         Subjective: Sitting up in bed.  No chest pain.  No shortness of breath.  States swelling improving.  Still with left hip pain however overall improving.  Agrees to try to sit up in the chair for 2 hours today.   Objective: Vitals:   10/16/21 2058 10/17/21 0522 10/17/21 0828 10/17/21 0935  BP: (!) 118/94 (!) 126/53 (!) 143/64 (!) 140/58  Pulse: 71 64 71 67  Resp: 18 18 18 17   Temp: 98.8 F (37.1 C) 98.4 F (36.9 C) 98.1 F (36.7 C) 98.3 F  (36.8 C)  TempSrc: Oral Oral Oral   SpO2: 98% 98% 97% 100%  Weight:      Height:        Intake/Output Summary (Last 24 hours) at 10/17/2021 1234 Last data filed at 10/17/2021 0800 Gross per 24 hour  Intake 960 ml  Output 4450 ml  Net -3490 ml    Filed Weights   10/14/21 2030 10/16/21 0810 10/16/21 1309  Weight: 91 kg 92.6 kg 88.3 kg    Examination:  General exam: NAD. Respiratory system: CTA B.  No wheezes, no crackles, no rhonchi.  Fair air movement.  Speaking in full sentences.   Cardiovascular system: Regular rate rhythm no murmurs rubs or gallops.  No JVD.  No lower extremity edema.  Gastrointestinal system: Abdomen is nondistended, soft and nontender. No organomegaly or masses felt. Normal bowel sounds heard. Central nervous system: Alert and oriented. No focal neurological deficits. Extremities: Symmetric 5 x 5 power.s/p L BKA. LLE with some edema. RLE with trace edema.  Shrinker on left lower extremity. Skin: No rashes, lesions or ulcers Psychiatry: Judgement and insight appear normal. Mood & affect appropriate.     Data Reviewed: I have personally reviewed following labs and imaging studies  CBC: Recent Labs  Lab 10/11/21 0416 10/16/21 0608  WBC 8.6 7.9  HGB 10.6* 11.5*  HCT 33.3* 36.9  MCV 91.0 92.3  PLT 266 229     Basic Metabolic Panel: Recent Labs  Lab 10/11/21 0416 10/16/21 0608 10/17/21 0545  NA 127* 129* 131*  K 5.0 5.1 4.8  CL 92* 91* 94*  CO2 26 26 28   GLUCOSE 120* 108* 121*  BUN 53* 43* 26*  CREATININE 5.94* 6.00* 4.54*  CALCIUM 8.8* 9.1 9.0  PHOS 6.0* 5.9* 5.6*     GFR: Estimated Creatinine Clearance: 17.1 mL/min (A) (by C-G formula based on SCr of 4.54 mg/dL (H)).  Liver Function Tests: Recent Labs  Lab 10/11/21 0416 10/16/21 0608 10/17/21 0545  ALBUMIN 2.7* 2.7* 2.8*     CBG: Recent Labs  Lab 10/15/21 2128 10/16/21 1706 10/16/21 2057 10/17/21 0732 10/17/21 1146  GLUCAP 135* 258* 97 138* 248*      No  results found for this or any previous visit (from the past 240 hour(s)).       Radiology Studies: No results found.      Scheduled Meds:  alosetron  1 mg Oral BID  amitriptyline  25 mg Oral QHS   amLODipine  10 mg Oral Q24H   atorvastatin  40 mg Oral q1800   carvedilol  6.25 mg Oral BID   Chlorhexidine Gluconate Cloth  6 each Topical Q0600   cyclobenzaprine  10 mg Oral TID   docusate sodium  100 mg Oral BID   DULoxetine  60 mg Oral Daily   famotidine  20 mg Oral Daily   furosemide  80 mg Oral Daily   heparin  5,000 Units Subcutaneous Q8H   hydrALAZINE  50 mg Oral TID   insulin aspart  0-6 Units Subcutaneous TID WC   insulin aspart  5 Units Subcutaneous TID WC   insulin glargine-yfgn  12 Units Subcutaneous QHS   multivitamin  1 tablet Oral QHS   pantoprazole  80 mg Oral Daily   sevelamer carbonate  1,600 mg Oral TID WC   sodium chloride flush  3 mL Intravenous Q12H   Continuous Infusions:  sodium chloride     sodium chloride 10 mL/hr at 09/19/21 1310   methocarbamol (ROBAXIN) IV       LOS: 52 days    Time spent: 40 minutes    Irine Seal, MD [facility]   To contact the attending provider between 7A-7P or the covering provider during after hours 7P-7A, please log into the web site [email or web address] and access using universal [place] password for that web site. If you do not have the password, please call the hospital operator.  10/17/2021, 12:34 PM

## 2021-10-17 NOTE — Progress Notes (Signed)
Occupational Therapy Treatment Patient Details Name: [name] MRN: [id number] DOB: [birth year] Today's Date: 10/17/2021   History of present illness 56 y.o. female presented to ED 6/26 from dialysis with increased bloody drainage from L hip wound. 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 s/p hardware removal of left hip with partial resection of tuft tissue and femure that were nonviable on 08/10/21 Discharged home 6/23. underwent L hip debridement and wound vac placement 6/28; +Left intertrochanteric non-union,  Fracture of left distal femur  PMH: hypertension, hyperlipidemia, ESRD on HD MWF, history of left BKA in 2021, depression/anxiety, stroke, tobacco use, T2DM,  insomnia, chronic pain syndrome,   OT comments  Patient received in supine and eager to transfer to recliner. Patient donned pants while at bed level with patient rolling to assist with pulling up and mod/max assist. Patient required assistance to setup slide board and min assist to lateral scoot to right to recliner and increased time. Patient performed UE HEP with yellow therapy band while up in recliner. Patient making good gains with increased independence with transfers and could benefit from increased resistance band. Acute OT to continue to follow.    Recommendations for follow up therapy are one component of a multi-disciplinary discharge planning process, led by the attending physician.  Recommendations may be updated based on patient status, additional functional criteria and insurance authorization.    Follow Up Recommendations  Skilled nursing-short term rehab (<3 hours/day)    Assistance Recommended at Discharge Intermittent Supervision/Assistance  Patient can return home with the following  A lot of help with bathing/dressing/bathroom;Assistance with cooking/housework;Assist for transportation;Help with stairs or ramp for entrance;Two people to help with walking and/or transfers    Equipment Recommendations  Other (comment) (TBD)    Recommendations for Other Services      Precautions / Restrictions Precautions Precautions: Fall;Other (comment) Precaution Comments: L BKA (baseline) Restrictions Weight Bearing Restrictions: No LLE Weight Bearing: Weight bearing as tolerated Other Position/Activity Restrictions: L distal femur fx; per Dr [name] 8/10 pt can full WB       Mobility Bed Mobility Overal bed mobility: Needs Assistance Bed Mobility: Supine to Sit, Rolling Rolling: Modified independent (Device/Increase time)   Supine to sit: Supervision     General bed mobility comments: increased time and effort    Transfers Overall transfer level: Needs assistance Equipment used: Sliding board Transfers: Bed to chair/wheelchair/BSC            Lateral/Scoot Transfers: Min assist General transfer comment: required assistance to setup sliding board and min assist to lateral scoot to recliner     Balance Overall balance assessment: Needs assistance Sitting-balance support: Feet supported Sitting balance-Leahy Scale: Good Sitting balance - Comments: no assistance with sitting balance                                   ADL either performed or assessed with clinical judgement   ADL Overall ADL's : Needs assistance/impaired                     Lower Body Dressing: Moderate assistance;Maximal assistance;Bed level Lower Body Dressing Details (indicate cue type and reason): donned pants at bed level with assistance to thread legs into clothing and to pull up with patient rolling side to side Toilet Transfer: Minimal assistance;Transfer board Toilet Transfer Details (indicate cue type and reason): simulated to drop arm recliner  General ADL Comments: donned pants in bed to assist with slide board transfer    Extremity/Trunk Assessment              Vision       Perception     Praxis      Cognition  Arousal/Alertness: Awake/alert Behavior During Therapy: WFL for tasks assessed/performed Overall Cognitive Status: Within Functional Limits for tasks assessed                                 General Comments: participated in slide board transfer to recliner and remained in recliner awaiting PT treatment        Exercises Exercises: General Upper Extremity General Exercises - Upper Extremity Shoulder Flexion: Strengthening, Both, 10 reps, Theraband, Seated Theraband Level (Shoulder Flexion): Level 1 (Yellow) Shoulder ABduction: Strengthening, Both, 15 reps, Theraband, Seated Theraband Level (Shoulder Abduction): Level 1 (Yellow) Elbow Flexion: Strengthening, Both, 10 reps, Theraband, Seated Theraband Level (Elbow Flexion): Level 1 (Yellow) Elbow Extension: Strengthening, Both, 10 reps, Theraband, Seated Theraband Level (Elbow Extension): Level 1 (Yellow)    Shoulder Instructions       General Comments      Pertinent Vitals/ Pain       Pain Assessment Pain Assessment: Faces Faces Pain Scale: Hurts a little bit Pain Location: rt knee>lt knee Pain Descriptors / Indicators: Aching, Sore Pain Intervention(s): Monitored during session, Repositioned  Home Living                                          Prior Functioning/Environment              Frequency  Min 2X/week        Progress Toward Goals  OT Goals(current goals can now be found in the care plan section)  Progress towards OT goals: Progressing toward goals  Acute Rehab OT Goals Patient Stated Goal: go home OT Goal Formulation: With patient Time For Goal Achievement: 10/22/21 Potential to Achieve Goals: Fair ADL Goals Pt Will Perform Grooming: with modified independence;sitting Pt Will Perform Lower Body Bathing: with supervision;sitting/lateral leans;with adaptive equipment Pt Will Perform Lower Body Dressing: with adaptive equipment;sitting/lateral leans;with  supervision Pt Will Transfer to Toilet: with min assist;with +2 assist;bedside commode;with transfer board Pt Will Perform Toileting - Clothing Manipulation and hygiene: with min assist;sitting/lateral leans Pt/caregiver will Perform Home Exercise Program: Increased ROM;Increased strength;Both right and left upper extremity;With written HEP provided;Independently;With theraband Additional ADL Goal #1: Pt will perform bed mobility modified independently in preparation for ADLs with HOB flat.  Plan Discharge plan remains appropriate;Frequency remains appropriate    Co-evaluation                 AM-PAC OT "6 Clicks" Daily Activity     Outcome Measure   Help from another person eating meals?: None Help from another person taking care of personal grooming?: A Little Help from another person toileting, which includes using toliet, bedpan, or urinal?: Total Help from another person bathing (including washing, rinsing, drying)?: A Lot Help from another person to put on and taking off regular upper body clothing?: A Little Help from another person to put on and taking off regular lower body clothing?: A Lot 6 Click Score: 15    End of Session Equipment Utilized During Treatment: Other (comment) (sliding board)  OT Visit  Diagnosis: Muscle weakness (generalized) (M62.81);Unsteadiness on feet (R26.81);History of falling (Z91.81)   Activity Tolerance Patient tolerated treatment well   Patient Left in chair;with call bell/phone within reach   Nurse Communication Mobility status        Time: 1856-3149 OT Time Calculation (min): 28 min  Charges: OT General Charges $OT Visit: 1 Visit OT Treatments $Self Care/Home Management : 8-22 mins $Therapeutic Exercise: 8-22 mins  [name], [place]  Office [phone]   [name] 10/17/2021, 2:04 PM

## 2021-10-17 NOTE — Progress Notes (Signed)
Physical Therapy Treatment Patient Details Name: [name] MRN: [id number] DOB: [birth year] Today's Date: 10/17/2021   History of Present Illness 56 y.o. female presented to ED 6/26 from dialysis with increased bloody drainage from L hip wound. 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 s/p hardware removal of left hip with partial resection of tuft tissue and femure that were nonviable on 08/10/21 Discharged home 6/23. underwent L hip debridement and wound vac placement 6/28; +Left intertrochanteric non-union,  Fracture of left distal femur  PMH: hypertension, hyperlipidemia, ESRD on HD MWF, history of left BKA in 2021, depression/anxiety, stroke, tobacco use, T2DM,  insomnia, chronic pain syndrome,    PT Comments    Patient up in chair on arrival! (Got up to chair with OT during their session and sat up ~1 hour). Patient required assist to place sliding board and then able to slide from chair to bed with minguard assist for safety. Deferred further sit to stand transfers due to need to use bedpan.     Recommendations for follow up therapy are one component of a multi-disciplinary discharge planning process, led by the attending physician.  Recommendations may be updated based on patient status, additional functional criteria and insurance authorization.  Follow Up Recommendations  Skilled nursing-short term rehab (<3 hours/day) Can patient physically be transported by private vehicle: No   Assistance Recommended at Discharge Intermittent Supervision/Assistance  Patient can return home with the following A lot of help with bathing/dressing/bathroom;Assistance with cooking/housework;Assist for transportation;Two people to help with walking and/or transfers;Help with stairs or ramp for entrance   Equipment Recommendations  Other (comment) (Seems well-equipped; agree with looking into medical [name] transport for HD)    Recommendations for Other Services        Precautions / Restrictions Precautions Precautions: Fall;Other (comment) Precaution Comments: L BKA (baseline) Restrictions Weight Bearing Restrictions: No LLE Weight Bearing: Weight bearing as tolerated Other Position/Activity Restrictions: L distal femur fx; per Dr [name] 8/10 pt can full WB     Mobility  Bed Mobility Overal bed mobility: Needs Assistance Bed Mobility: Rolling, Sit to Supine Rolling: Modified independent (Device/Increase time)     Sit to supine: Modified independent (Device/Increase time)   General bed mobility comments: increased time and effort    Transfers Overall transfer level: Needs assistance Equipment used: Sliding board Transfers: Bed to chair/wheelchair/BSC            Lateral/Scoot Transfers: Min assist General transfer comment: required assistance to setup sliding board and minguard to lateral scoot recliner to bed; sit to stand transfers deferred due to pt needing to get on bedpan    Ambulation/Gait                   Stairs             Wheelchair Mobility    Modified Rankin (Stroke Patients Only)       Balance Overall balance assessment: Needs assistance Sitting-balance support: Feet supported Sitting balance-Leahy Scale: Good Sitting balance - Comments: no assistance with sitting balance                                    Cognition Arousal/Alertness: Awake/alert Behavior During Therapy: WFL for tasks assessed/performed Overall Cognitive Status: Within Functional Limits for tasks assessed  Exercises      General Comments        Pertinent Vitals/Pain Pain Assessment Pain Assessment: Faces Faces Pain Scale: Hurts a little bit Pain Location: back Pain Descriptors / Indicators: Aching, Sore Pain Intervention(s): Limited activity within patient's tolerance, Monitored during session, Repositioned    Home Living                           Prior Function            PT Goals (current goals can now be found in the care plan section) Acute Rehab PT Goals Patient Stated Goal: less pain with movement PT Goal Formulation: With patient Time For Goal Achievement: 10/24/21 Potential to Achieve Goals: Fair Progress towards PT goals: Progressing toward goals    Frequency    Min 2X/week      PT Plan Current plan remains appropriate    Co-evaluation              AM-PAC PT "6 Clicks" Mobility   Outcome Measure  Help needed turning from your back to your side while in a flat bed without using bedrails?: None Help needed moving from lying on your back to sitting on the side of a flat bed without using bedrails?: None Help needed moving to and from a bed to a chair (including a wheelchair)?: A Little Help needed standing up from a chair using your arms (e.g., wheelchair or bedside chair)?: Total Help needed to walk in hospital room?: Total Help needed climbing 3-5 steps with a railing? : Total 6 Click Score: 14    End of Session   Activity Tolerance: Patient tolerated treatment well Patient left: in bed;with call bell/phone within reach   PT Visit Diagnosis: Other abnormalities of gait and mobility (R26.89);Muscle weakness (generalized) (M62.81);History of falling (Z91.81);Pain Pain - Right/Left: Left Pain - part of body: Leg     Time: 1407-1430 PT Time Calculation (min) (ACUTE ONLY): 23 min  Charges:  $Therapeutic Activity: 23-37 mins                      Arby Barrette, PT Acute Rehabilitation Services  Office [phone]    [name] 10/17/2021, 3:36 PM

## 2021-10-18 DIAGNOSIS — F419 Anxiety disorder, unspecified: Secondary | ICD-10-CM | POA: Diagnosis not present

## 2021-10-18 DIAGNOSIS — M86252 Subacute osteomyelitis, left femur: Secondary | ICD-10-CM | POA: Diagnosis not present

## 2021-10-18 DIAGNOSIS — T148XXA Other injury of unspecified body region, initial encounter: Secondary | ICD-10-CM | POA: Diagnosis not present

## 2021-10-18 DIAGNOSIS — K2100 Gastro-esophageal reflux disease with esophagitis, without bleeding: Secondary | ICD-10-CM | POA: Diagnosis not present

## 2021-10-18 LAB — RENAL FUNCTION PANEL
Albumin: 2.8 g/dL — ABNORMAL LOW (ref 3.5–5.0)
Anion gap: 9 (ref 5–15)
BUN: 37 mg/dL — ABNORMAL HIGH (ref 6–20)
CO2: 26 mmol/L (ref 22–32)
Calcium: 8.7 mg/dL — ABNORMAL LOW (ref 8.9–10.3)
Chloride: 92 mmol/L — ABNORMAL LOW (ref 98–111)
Creatinine, Ser: 5.49 mg/dL — ABNORMAL HIGH (ref 0.44–1.00)
GFR, Estimated: 9 mL/min — ABNORMAL LOW (ref >60)
Glucose, Bld: 117 mg/dL — ABNORMAL HIGH (ref 70–99)
Phosphorus: 6.5 mg/dL — ABNORMAL HIGH (ref 2.5–4.6)
Potassium: 4.6 mmol/L (ref 3.5–5.1)
Sodium: 127 mmol/L — ABNORMAL LOW (ref 135–145)

## 2021-10-18 LAB — CBC
HCT: 36.7 % (ref 36.0–46.0)
Hemoglobin: 11.7 g/dL — ABNORMAL LOW (ref 12.0–15.0)
MCH: 28.8 pg (ref 26.0–34.0)
MCHC: 31.9 g/dL (ref 30.0–36.0)
MCV: 90.4 fL (ref 80.0–100.0)
Platelets: 202 10*3/uL (ref 150–400)
RBC: 4.06 MIL/uL (ref 3.87–5.11)
RDW: 16.4 % — ABNORMAL HIGH (ref 11.5–15.5)
WBC: 7.3 10*3/uL (ref 4.0–10.5)
nRBC: 0 % (ref 0.0–0.2)

## 2021-10-18 LAB — GLUCOSE, CAPILLARY
Glucose-Capillary: 109 mg/dL — ABNORMAL HIGH (ref 70–99)
Glucose-Capillary: 188 mg/dL — ABNORMAL HIGH (ref 70–99)
Glucose-Capillary: 241 mg/dL — ABNORMAL HIGH (ref 70–99)

## 2021-10-18 MED ORDER — PENTAFLUOROPROP-TETRAFLUOROETH EX AERO
INHALATION_SPRAY | CUTANEOUS | Status: AC
  Filled 2021-10-18: qty 30

## 2021-10-18 NOTE — Progress Notes (Signed)
Received patient in bed to unit.  Alert and oriented.  Informed consent signed and in  chart.   Treatment initiated: 0845 Treatment completed: 1246  Patient tolerated well.  Transported back to the room  alert, without acute distress.  Hand-off given to patient's nurse.   Access used: fistula Access issues: none  Total UF removed: 4000 Medication(s) given: none Post HD VS: 98.3, 124/62(76), HR-61, RR-16,SP02-100 Post HD weight: 89.3kg   [facility]

## 2021-10-18 NOTE — Progress Notes (Signed)
[name] Progress Note   Subjective:   Patient seen and examined on dialysis. Tolerating treatment currently with UFG of 4L. Tolerated sitting in chair for about an hour yesterday. Still does not want to do dialysis in chair.  Objective Vitals:   10/18/21 0839 10/18/21 0845 10/18/21 0900 10/18/21 0930  BP:  127/65 132/68 117/60  Pulse: 61 62 62 63  Resp: 15 13 10 14   Temp: 98.1 F (36.7 C)     TempSrc:      SpO2: 97% 95% 95% 98%  Weight:      Height:       Physical Exam General: Well appearing, [name], sitting up in bed Heart: RRR, no murmurs, rubs or gallops Lungs: CTA bilaterally without wheezing, rhonchi or rales Abdomen: Soft, non-distended, +BS Extremities: L stump with trace edema, very trace/minimal edema in R leg w/ compression stocking in place Dialysis Access:  LUE AVF  + bruit (in use)  Additional Objective Labs: Basic Metabolic Panel: Recent Labs  Lab 10/16/21 0608 10/17/21 0545 10/18/21 0507  NA 129* 131* 127*  K 5.1 4.8 4.6  CL 91* 94* 92*  CO2 26 28 26   GLUCOSE 108* 121* 117*  BUN 43* 26* 37*  CREATININE 6.00* 4.54* 5.49*  CALCIUM 9.1 9.0 8.7*  PHOS 5.9* 5.6* 6.5*   Liver Function Tests: Recent Labs  Lab 10/16/21 0608 10/17/21 0545 10/18/21 0507  ALBUMIN 2.7* 2.8* 2.8*   No results for input(s): "LIPASE", "AMYLASE" in the last 168 hours. CBC: Recent Labs  Lab 10/16/21 0608 10/18/21 0507  WBC 7.9 7.3  HGB 11.5* 11.7*  HCT 36.9 36.7  MCV 92.3 90.4  PLT 229 202   Blood Culture    Component Value Date/Time   SDES TISSUE 08/28/2021 0917   SPECREQUEST LEFT THIGH TISSUE 08/28/2021 0917   CULT  08/28/2021 0917    MODERATE ENTEROCOCCUS FAECIUM VANCOMYCIN RESISTANT ENTEROCOCCUS ISOLATED NO ANAEROBES ISOLATED Sent to [place] for further susceptibility testing. Performed at [facility], [place] [address] [place], [place] [address]    REPTSTATUS 09/02/2021 FINAL 08/28/2021 6063    Cardiac  Enzymes: No results for input(s): "CKTOTAL", "CKMB", "CKMBINDEX", "TROPONINI" in the last 168 hours.  CBG: Recent Labs  Lab 10/17/21 0732 10/17/21 1146 10/17/21 1632 10/17/21 2136 10/18/21 0732  GLUCAP 138* 248* 131* 246* 109*   Iron Studies: No results for input(s): "IRON", "TIBC", "TRANSFERRIN", "FERRITIN" in the last 72 hours. @lablastinr3 @ Studies/Results: No results found. Medications:  sodium chloride     sodium chloride 10 mL/hr at 09/19/21 1310   methocarbamol (ROBAXIN) IV      alosetron  1 mg Oral BID   amitriptyline  25 mg Oral QHS   amLODipine  10 mg Oral Q24H   atorvastatin  40 mg Oral q1800   carvedilol  6.25 mg Oral BID   Chlorhexidine Gluconate Cloth  6 each Topical Q0600   cyclobenzaprine  10 mg Oral TID   docusate sodium  100 mg Oral BID   DULoxetine  60 mg Oral Daily   famotidine  20 mg Oral Daily   furosemide  80 mg Oral Daily   heparin  5,000 Units Subcutaneous Q8H   hydrALAZINE  25 mg Oral TID   insulin aspart  0-6 Units Subcutaneous TID WC   insulin aspart  5 Units Subcutaneous TID WC   insulin glargine-yfgn  12 Units Subcutaneous QHS   multivitamin  1 tablet Oral QHS   pantoprazole  80 mg Oral Daily  pentafluoroprop-tetrafluoroeth       sevelamer carbonate  1,600 mg Oral TID WC   sodium chloride flush  3 mL Intravenous Q12H    Dialysis Orders: MWF at [facility], 400/500, EDW 90kg, 2K/2Ca, LUE AVF, heparin 4000 unit bolus  Assessment/Plan: 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 Back to OR 08/28/21, 08/31/21 for L hip debridement and wound vac placement (now removed). Finished 6 week course of IV Daptomycin on 10/12/21. 2. ESRD: Continue HD on MWF schedule. Intermittent AVF cannulation issues but working well recently. Will continue to monitor. Will continue to encourage to sit in chair for HD. Update labs.  3. AVF Access: S/p branch ligation in OR 09/19/2021 per Dr. Virl Cagey.  4. HTN/ volume: BP controlled. On  amlodipine/hydralazine/Coreg/Lasix. Still with edema but improved today. Max UF as tolerated and has been reminded of importance of following fluid and sodium restrictions.  5. 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 Hold next Aranesp dose.  6. Secondary HPTH: Cca controlled. Phos slightly elevated. Continue Renvela as binder. No VDRA for now. 7. DM2: Insulin per primary 8. Dispo: SNF recommended.  Per PT notes -now willing to go to SNF. Very difficult situation.  Not clear what chance of success she will have as OP regardless of her ability to sit in chair if her home situation is not improved. She says her goal is to be able to walk prior to discharge.   Gean Quint, MD [facility]

## 2021-10-18 NOTE — Progress Notes (Signed)
PROGRESS NOTE    [name]  KDX:[id number] DOB: Jan 24, 1966 DOA: 08/26/2021 PCP: [name], MD    Chief Complaint  Patient presents with   Wound Infection    Brief Narrative:  [name] was admitted to the hospital with the working diagnosis of subacute osteomyelitis left intertrochanteric fracture.    56 year old woman PMH including ESRD, complicated left hip and femur issues requiring multiple surgeries, removal of hardware presented 6/26 with bloody drainage from surgical incision.  Seen by orthopedics and underwent I&D 6/28 and 7/1.  Currently unable to progress care secondary to refusal to sit in chair for HD.     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  Active Problems:   ESRD (end stage renal disease) on dialysis (HCC)   Type 2 diabetes mellitus with hyperlipidemia (HCC)   History of CVA (cerebrovascular accident)   Essential hypertension   Anxiety and depression   GASTROESOPHAGEAL REFLUX, NO ESOPHAGITIS   TOBACCO DEPENDENCE   Wound infection   Wound dehiscence, surgical, sequela  #1 subacute osteomyelitis left femur with abscess/infected left hip hardware status post removal/left intertrochanteric fracture -Patient seen and followed by orthopedics during the hospitalization, patient underwent left hip debridement, application of wound VAC by Dr. Gavin Potters 08/28/2021. -Status post wound VAC removal 10/05/18/2023 per orthopedics. -Continue wound care. -Patient cleared by orthopedics from their standpoint for discharge with outpatient follow-up. -Patient seen by ID and daptomycin x6 weeks recommended with hemodialysis with stop date 10/12/2021 which has been completed. -Patient seen later on during the hospitalization by Dr. [name] who recommended lymphedema pump as outpatient, has pump cannot be arranged in-house. -Generalized edema improving with dialysis. -Outpatient follow-up with orthopedics, ID, nephrology.  2.   ESRD on dialysis -Patient on HD Monday Wednesday Friday needs to be able to sit in the chair to tolerate outpatient dialysis prior to discharge. -Patient seen by vascular surgery status post fistula revision and branch ligation. -Patient could not tolerate HD. -Case discussed with nephrology who recommended SNF as patient would not be safe for home DC (no ramp to get into home) -Continue current fluid restrictions, concerns regarding compliance with fluid intake. -Patient noted on HD on 8/7 to have brief run of 2 hours, reported mention problems asked to be taken off. -It is noted per Dr. Rhetta Mura note that patient stated she is still not ready to have HD in the chair for several complaints including pain, fluid and weakness. -Currently on Lasix 80 mg daily. -On hemodialysis. -Nephrology following and per nephrology patient with issues with no handicap ramp at home, outpatient HD clinic cannot help her get in and out of the car when she arrives to HD.  They indicate is not clear what chance of success she will have in the outpatient regardless of her ability to sit in chair if her home situation is not improved. -Likely needs SNF placement. -Patient stated was able to sit up in chair for 1 hour and 20 minutes yesterday. -On HD. -Per nephrology.  3.  Anasarca -Secondary to ESRD and volume overload. -Volume management per HD. -Volume overload improving on dialysis. -2D echo on 10/09/2021 with EF of 60 to 65%, NWMA. -Continue HD per nephrology.  4.  Hyponatremia -Being managed on HD. -Per nephrology.  5.  Hyperkalemia -Improved on HD.  6.  Anemia of chronic disease/CKD, component of acute blood loss anemia from wound -H&H stable. - Aranesp per nephrology.  7.  Uncontrolled type 2 diabetes  with hyperglycemia -A1c 7.1 07/2021 -CBG 109 this morning. -Continue Semglee, NovoLog, SSI.  8.  Hypertension -Continue Norvasc, Coreg, Lasix. -Patient with soft blood pressure on 10/17/2021 and as  such hydralazine dose has been decreased to 25 mg 3 times daily.   -Follow.  9.  Hyperlipidemia -Continue statin.    10.  History of CVA -Statin.  11.  Depression/anxiety -Stable.   -Continue Cymbalta/Elavil.  12.  GERD -Pepcid, PPI.   13.  Tobacco dependence -Tobacco cessation. -Patient noted to have declined nicotine patch.   DVT prophylaxis: Heparin Code Status: Full Family Communication: Updated patient.  No family at bedside. Disposition: Currently medically stable.  SNF once patient will is able to sit in a chair for dialysis.  TOC following.  Status is: Inpatient Remains inpatient appropriate because: Severity of illness/   Consultants:  Orthopedics: Dr. [name] 08/27/2021 ID: Dr. Gale Journey 08/27/2021 Nephrology: Dr.Schertz 08/27/2021 Vascular surgery: Dr. Unk Lightning 09/06/2021   Procedures:  CT left hip 08/26/2021 Plain films of the right knee 08/26/2021 Plain films of the right foot 08/26/2021 2D echo 10/09/2021 Vascular ultrasound duplex dialysis access 09/07/2021 Fistula revision branch ligation per Dr. Unk Lightning 09/19/2021- Left hip debridement, application of wound VAC by Dr. [name] 08/28/2021  Antimicrobials:  Anti-infectives (From admission, onward)    Start     Dose/Rate Route Frequency Ordered Stop   10/02/21 2000  DAPTOmycin (CUBICIN) 900 mg in sodium chloride 0.9 % IVPB  Status:  Discontinued        10 mg/kg  92.4 kg 136 mL/hr over 30 Minutes Intravenous Once per day on Mon Wed Fri 10/01/21 1350 10/12/21 1146   09/30/21 2000  DAPTOmycin (CUBICIN) 900 mg in sodium chloride 0.9 % IVPB  Status:  Discontinued        10 mg/kg  92.4 kg 136 mL/hr over 30 Minutes Intravenous Daily 09/28/21 0919 10/01/21 1350   09/28/21 2000  DAPTOmycin (CUBICIN) 900 mg in sodium chloride 0.9 % IVPB        900 mg 136 mL/hr over 30 Minutes Intravenous  Once 09/28/21 0919 09/28/21 2146   09/02/21 2000  DAPTOmycin (CUBICIN) 900 mg in sodium chloride 0.9 % IVPB  Status:  Discontinued        10  mg/kg  92.4 kg 136 mL/hr over 30 Minutes Intravenous Once per day on Mon Wed Fri 09/02/21 0836 09/28/21 0919   08/31/21 0745  ceFAZolin (ANCEF) IVPB 2g/100 mL premix        2 g 200 mL/hr over 30 Minutes Intravenous To Short Stay 08/31/21 0730 08/31/21 0918   08/30/21 0600  ceFAZolin (ANCEF) IVPB 2g/100 mL premix  Status:  Discontinued        2 g 200 mL/hr over 30 Minutes Intravenous To Short Stay 08/30/21 0123 08/31/21 0600   08/29/21 2000  DAPTOmycin (CUBICIN) 900 mg in sodium chloride 0.9 % IVPB  Status:  Discontinued        10 mg/kg  92.4 kg 136 mL/hr over 30 Minutes Intravenous Every 48 hours 08/29/21 1627 09/02/21 0836   08/28/21 1800  ceFAZolin (ANCEF) IVPB 2g/100 mL premix  Status:  Discontinued        2 g 200 mL/hr over 30 Minutes Intravenous Every M-W-F (1800) 08/27/21 0846 08/29/21 1620   08/28/21 1200  ceFAZolin (ANCEF) IVPB 1 g/50 mL premix  Status:  Discontinued        1 g 100 mL/hr over 30 Minutes Intravenous Every M-W-F (Hemodialysis) 08/26/21 1954 08/27/21 0846   08/28/21 0600  ceFAZolin (  ANCEF) IVPB 2g/100 mL premix        2 g 200 mL/hr over 30 Minutes Intravenous On call to O.R. 08/27/21 1150 08/28/21 0909   08/27/21 1800  ceFAZolin (ANCEF) IVPB 1 g/50 mL premix        1 g 100 mL/hr over 30 Minutes Intravenous  Once 08/26/21 1954 08/27/21 2048   08/26/21 1500  ceFEPIme (MAXIPIME) 1 g in sodium chloride 0.9 % 100 mL IVPB  Status:  Discontinued        1 g 200 mL/hr over 30 Minutes Intravenous Every 24 hours 08/26/21 1452 08/26/21 1714         Subjective: In HD.  No chest pain.  No shortness of breath.  Swelling slowly improving.  Still with left hip pain however slowly improving.  Stated able to sit up in the chair yesterday for an hour and 20 minutes.    Objective: Vitals:   10/18/21 1030 10/18/21 1100 10/18/21 1130 10/18/21 1200  BP: (!) 129/54 (!) 120/53 (!) 124/52 (!) 122/59  Pulse: 60 60 (!) 59 (!) 59  Resp: 19 12 (!) 8 12  Temp:      TempSrc:       SpO2: 98% 99% 99% 98%  Weight:      Height:        Intake/Output Summary (Last 24 hours) at 10/18/2021 1232 Last data filed at 10/18/2021 0200 Gross per 24 hour  Intake 500 ml  Output --  Net 500 ml    Filed Weights   10/16/21 0810 10/16/21 1309 10/18/21 0828  Weight: 92.6 kg 88.3 kg 91.5 kg    Examination:  General exam: NAD. Respiratory system: CTA B anterior lung fields.  No wheezes, no crackles, no rhonchi.  Fair air movement.  Speaking in full sentences.  Cardiovascular system: RRR no murmurs rubs or gallops.  No JVD.  No lower extremity edema.  Gastrointestinal system: Abdomen is soft, nontender, nondistended, positive bowel sounds.  No rebound.  No guarding.  Central nervous system: Alert and oriented. No focal neurological deficits. Extremities: Symmetric 5 x 5 power.s/p L BKA. LLE with some edema. RLE with trace edema.  Shrinker on left lower extremity. Skin: No rashes, lesions or ulcers Psychiatry: Judgement and insight appear normal. Mood & affect appropriate.     Data Reviewed: I have personally reviewed following labs and imaging studies  CBC: Recent Labs  Lab 10/16/21 0608 10/18/21 0507  WBC 7.9 7.3  HGB 11.5* 11.7*  HCT 36.9 36.7  MCV 92.3 90.4  PLT 229 202     Basic Metabolic Panel: Recent Labs  Lab 10/16/21 0608 10/17/21 0545 10/18/21 0507  NA 129* 131* 127*  K 5.1 4.8 4.6  CL 91* 94* 92*  CO2 26 28 26   GLUCOSE 108* 121* 117*  BUN 43* 26* 37*  CREATININE 6.00* 4.54* 5.49*  CALCIUM 9.1 9.0 8.7*  PHOS 5.9* 5.6* 6.5*     GFR: Estimated Creatinine Clearance: 14.4 mL/min (A) (by C-G formula based on SCr of 5.49 mg/dL (H)).  Liver Function Tests: Recent Labs  Lab 10/16/21 0608 10/17/21 0545 10/18/21 0507  ALBUMIN 2.7* 2.8* 2.8*     CBG: Recent Labs  Lab 10/17/21 0732 10/17/21 1146 10/17/21 1632 10/17/21 2136 10/18/21 0732  GLUCAP 138* 248* 131* 246* 109*      No results found for this or any previous visit (from the  past 240 hour(s)).       Radiology Studies: No results found.  Scheduled Meds:  alosetron  1 mg Oral BID   amitriptyline  25 mg Oral QHS   amLODipine  10 mg Oral Q24H   atorvastatin  40 mg Oral q1800   carvedilol  6.25 mg Oral BID   Chlorhexidine Gluconate Cloth  6 each Topical Q0600   cyclobenzaprine  10 mg Oral TID   docusate sodium  100 mg Oral BID   DULoxetine  60 mg Oral Daily   famotidine  20 mg Oral Daily   furosemide  80 mg Oral Daily   heparin  5,000 Units Subcutaneous Q8H   hydrALAZINE  25 mg Oral TID   insulin aspart  0-6 Units Subcutaneous TID WC   insulin aspart  5 Units Subcutaneous TID WC   insulin glargine-yfgn  12 Units Subcutaneous QHS   multivitamin  1 tablet Oral QHS   pantoprazole  80 mg Oral Daily   pentafluoroprop-tetrafluoroeth       sevelamer carbonate  1,600 mg Oral TID WC   sodium chloride flush  3 mL Intravenous Q12H   Continuous Infusions:  sodium chloride     sodium chloride 10 mL/hr at 09/19/21 1310   methocarbamol (ROBAXIN) IV       LOS: 53 days    Time spent: 35 minutes    Irine Seal, MD [facility]   To contact the attending provider between 7A-7P or the covering provider during after hours 7P-7A, please log into the web site [email or web address] and access using universal  password for that web site. If you do not have the password, please call the hospital operator.  10/18/2021, 12:32 PM

## 2021-10-18 NOTE — Inpatient Diabetes Management (Signed)
Inpatient Diabetes Program Recommendations  AACE/ADA: New Consensus Statement on Inpatient Glycemic Control (2015)  Target Ranges:  Prepandial:   less than 140 mg/dL      Peak postprandial:   less than 180 mg/dL (1-2 hours)      Critically ill patients:  140 - 180 mg/dL   Lab Results  Component Value Date   GLUCAP 109 (H) 10/18/2021   HGBA1C 7.1 (H) 07/22/2021    Review of Glycemic Control  Latest Reference Range & Units 10/17/21 07:32 10/17/21 11:46 10/17/21 16:32 10/17/21 21:36 10/18/21 07:32  Glucose-Capillary 70 - 99 mg/dL 138 (H) 248 (H) 131 (H) 246 (H) 109 (H)  (H): Data is abnormally high  Current orders for Inpatient glycemic control:  Semglee 12 units QD Novolog 0-6 units TID, Novolog 5 units TID with meals   Inpatient Diabetes Program Recommendations:    Novolog 7 units TID with meals IF consumes at least 50%.  Will continue to follow while inpatient.  Thank you, Reche Dixon, MSN, [facility] Coordinator Inpatient Diabetes Program [phone] (team pager from 8a-5p)

## 2021-10-18 NOTE — Plan of Care (Signed)
  Problem: Activity: Goal: Risk for activity intolerance will decrease Outcome: Progressing   Problem: Pain Managment: Goal: General experience of comfort will improve Outcome: Progressing   Problem: Education: Goal: Individualized Educational Video(s) Outcome: Progressing   Problem: Fluid Volume: Goal: Compliance with measures to maintain balanced fluid volume will improve Outcome: Progressing   Problem: Health Behavior/Discharge Planning: Goal: Ability to manage health-related needs will improve Outcome: Progressing   Problem: Nutritional: Goal: Ability to make healthy dietary choices will improve Outcome: Progressing   Problem: Clinical Measurements: Goal: Complications related to the disease process, condition or treatment will be avoided or minimized Outcome: Progressing

## 2021-10-19 DIAGNOSIS — T148XXA Other injury of unspecified body region, initial encounter: Secondary | ICD-10-CM | POA: Diagnosis not present

## 2021-10-19 DIAGNOSIS — K2100 Gastro-esophageal reflux disease with esophagitis, without bleeding: Secondary | ICD-10-CM | POA: Diagnosis not present

## 2021-10-19 DIAGNOSIS — M86252 Subacute osteomyelitis, left femur: Secondary | ICD-10-CM | POA: Diagnosis not present

## 2021-10-19 DIAGNOSIS — F419 Anxiety disorder, unspecified: Secondary | ICD-10-CM | POA: Diagnosis not present

## 2021-10-19 LAB — RENAL FUNCTION PANEL
Albumin: 2.8 g/dL — ABNORMAL LOW (ref 3.5–5.0)
Anion gap: 9 (ref 5–15)
BUN: 39 mg/dL — ABNORMAL HIGH (ref 6–20)
CO2: 27 mmol/L (ref 22–32)
Calcium: 8.9 mg/dL (ref 8.9–10.3)
Chloride: 92 mmol/L — ABNORMAL LOW (ref 98–111)
Creatinine, Ser: 5.36 mg/dL — ABNORMAL HIGH (ref 0.44–1.00)
GFR, Estimated: 9 mL/min — ABNORMAL LOW (ref >60)
Glucose, Bld: 123 mg/dL — ABNORMAL HIGH (ref 70–99)
Phosphorus: 5.9 mg/dL — ABNORMAL HIGH (ref 2.5–4.6)
Potassium: 4.8 mmol/L (ref 3.5–5.1)
Sodium: 128 mmol/L — ABNORMAL LOW (ref 135–145)

## 2021-10-19 LAB — GLUCOSE, CAPILLARY
Glucose-Capillary: 126 mg/dL — ABNORMAL HIGH (ref 70–99)
Glucose-Capillary: 130 mg/dL — ABNORMAL HIGH (ref 70–99)
Glucose-Capillary: 114 mg/dL — ABNORMAL HIGH (ref 70–99)
Glucose-Capillary: 105 mg/dL — ABNORMAL HIGH (ref 70–99)

## 2021-10-19 NOTE — Progress Notes (Signed)
[facility] Progress Note   Subjective:   Patient seen and examined at bedside.  Admits to edema.  Denies CP, SOB, abdominal pain and n/v/d.  Tolerated dialysis well yesterday using LU AVF without issues.   Objective Vitals:   10/18/21 1647 10/18/21 2112 10/19/21 0442 10/19/21 0829  BP: 129/61 (!) 141/57 (!) 134/52 124/75  Pulse: 65 69 64 69  Resp: 17 18  18   Temp: 97.9 F (36.6 C) (!) 97.5 F (36.4 C) 98.9 F (37.2 C) 99.5 F (37.5 C)  TempSrc:    Oral  SpO2: 96% 98% 96% 97%  Weight:      Height:       Physical Exam General:well appearing female in NAD, sitting up in bed Heart:RRR, no mrg Lungs:CTAB, nml WOB on RA Abdomen:soft, NTND, +edema in flank Extremities:L stump w/shrinker in place, RLE with compression sock, no edema Dialysis Access: LU AVF +b/t   Filed Weights   10/16/21 0810 10/16/21 1309 10/18/21 0828  Weight: 92.6 kg 88.3 kg 91.5 kg    Intake/Output Summary (Last 24 hours) at 10/19/2021 1307 Last data filed at 10/19/2021 0800 Gross per 24 hour  Intake 420 ml  Output 200 ml  Net 220 ml    Additional Objective Labs: Basic Metabolic Panel: Recent Labs  Lab 10/17/21 0545 10/18/21 0507 10/19/21 0259  NA 131* 127* 128*  K 4.8 4.6 4.8  CL 94* 92* 92*  CO2 28 26 27   GLUCOSE 121* 117* 123*  BUN 26* 37* 39*  CREATININE 4.54* 5.49* 5.36*  CALCIUM 9.0 8.7* 8.9  PHOS 5.6* 6.5* 5.9*   Liver Function Tests: Recent Labs  Lab 10/17/21 0545 10/18/21 0507 10/19/21 0259  ALBUMIN 2.8* 2.8* 2.8*   CBC: Recent Labs  Lab 10/16/21 0608 10/18/21 0507  WBC 7.9 7.3  HGB 11.5* 11.7*  HCT 36.9 36.7  MCV 92.3 90.4  PLT 229 202   Recent Labs  Lab 10/18/21 0732 10/18/21 1644 10/18/21 2111 10/19/21 0719 10/19/21 1138  GLUCAP 109* 188* 241* 126* 130*   Medications:  sodium chloride     sodium chloride 10 mL/hr at 09/19/21 1310   methocarbamol (ROBAXIN) IV      alosetron  1 mg Oral BID   amitriptyline  25 mg Oral QHS   amLODipine   10 mg Oral Q24H   atorvastatin  40 mg Oral q1800   carvedilol  6.25 mg Oral BID   Chlorhexidine Gluconate Cloth  6 each Topical Q0600   cyclobenzaprine  10 mg Oral TID   docusate sodium  100 mg Oral BID   DULoxetine  60 mg Oral Daily   famotidine  20 mg Oral Daily   furosemide  80 mg Oral Daily   heparin  5,000 Units Subcutaneous Q8H   hydrALAZINE  25 mg Oral TID   insulin aspart  0-6 Units Subcutaneous TID WC   insulin aspart  5 Units Subcutaneous TID WC   insulin glargine-yfgn  12 Units Subcutaneous QHS   multivitamin  1 tablet Oral QHS   pantoprazole  80 mg Oral Daily   sevelamer carbonate  1,600 mg Oral TID WC   sodium chloride flush  3 mL Intravenous Q12H    Dialysis Orders: MWF at [place] [facility], 400/500, EDW 90kg, 2K/2Ca, LUE AVF, heparin 4000 unit bolus   Assessment/Plan: 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 Back to OR 08/28/21, 08/31/21 for L hip debridement and wound vac placement (now removed). Finished 6 week course of IV  Daptomycin on 10/12/21. 2. ESRD: Continue HD on MWF schedule. Intermittent AVF cannulation issues but working well recently. Will continue to monitor. Will continue to encourage to sit in chair for HD.  3. AVF Access: S/p branch ligation in OR 09/19/2021 per Dr. Virl Cagey.  4. HTN/ volume: BP controlled. On amlodipine/hydralazine/Coreg/Lasix. Still with edema but improved. Max UF as tolerated and has been reminded of importance of following fluid and sodium restrictions.  5. Anemia of ESRD: Hgb 11.7. D/c ESA.  6. Secondary HPTH: Cca controlled. Phos slightly elevated. Continue Renvela as binder. No VDRA for now. 7. DM2: Insulin per primary 8. Dispo: SNF recommended.  Per PT notes -now willing to go to SNF. Very difficult situation.  Not clear what chance of success she will have as OP regardless of her ability to sit in chair if her home situation is not improved. She says her goal is to be able to walk prior to discharge.    Jen Mow, PA-C [facility] 10/19/2021,1:07 PM  LOS: 54 days

## 2021-10-19 NOTE — Progress Notes (Signed)
PROGRESS NOTE    [name]  KCM:[id number] DOB: November 10, 1965 DOA: 08/26/2021 PCP: [name], MD    Chief Complaint  Patient presents with   Wound Infection    Brief Narrative:  [name] was admitted to the hospital with the working diagnosis of subacute osteomyelitis left intertrochanteric fracture.    56 year old woman PMH including ESRD, complicated left hip and femur issues requiring multiple surgeries, removal of hardware presented 6/26 with bloody drainage from surgical incision.  Seen by orthopedics and underwent I&D 6/28 and 7/1.  Currently unable to progress care secondary to refusal to sit in chair for HD.     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  Active Problems:   ESRD (end stage renal disease) on dialysis (HCC)   Type 2 diabetes mellitus with hyperlipidemia (HCC)   History of CVA (cerebrovascular accident)   Essential hypertension   Anxiety and depression   GASTROESOPHAGEAL REFLUX, NO ESOPHAGITIS   TOBACCO DEPENDENCE   Wound infection   Wound dehiscence, surgical, sequela  #1 subacute osteomyelitis left femur with abscess/infected left hip hardware status post removal/left intertrochanteric fracture -Patient seen and followed by orthopedics during the hospitalization, patient underwent left hip debridement, application of wound VAC by Dr. Gavin Potters 08/28/2021. -Status post wound VAC removal 10/05/18/2023 per orthopedics. -Continue wound care. -Patient cleared by orthopedics from their standpoint for discharge with outpatient follow-up. -Patient seen by ID and daptomycin x6 weeks recommended with hemodialysis with stop date 10/12/2021 which has been completed. -Patient seen later on during the hospitalization by Dr. [name] who recommended lymphedema pump as outpatient, has pump cannot be arranged in-house. -Generalized edema improving with dialysis. -Outpatient follow-up with orthopedics, ID, nephrology.  2.   ESRD on dialysis -Patient on HD Monday Wednesday Friday needs to be able to sit in the chair to tolerate outpatient dialysis prior to discharge. -Patient seen by vascular surgery status post fistula revision and branch ligation. -Patient could not tolerate HD. -Case discussed with nephrology who recommended SNF as patient would not be safe for home DC (no ramp to get into home) -Continue current fluid restrictions, concerns regarding compliance with fluid intake. -Patient noted on HD on 8/7 to have brief run of 2 hours, reported mention problems asked to be taken off. -It is noted per Dr. Rhetta Mura note that patient stated she is still not ready to have HD in the chair for several complaints including pain, fluid and weakness. -Currently on Lasix 80 mg daily. -On hemodialysis. -Nephrology following and per nephrology patient with issues with no handicap ramp at home, outpatient HD clinic cannot help her get in and out of the car when she arrives to HD.  They indicate is not clear what chance of success she will have in the outpatient regardless of her ability to sit in chair if her home situation is not improved. -Likely needs SNF placement. -Patient stated was able to sit up in chair for 1 hour and 20 minutes on 10/17/2021. -On HD. -Per nephrology.  3.  Anasarca -Secondary to ESRD and volume overload. -Volume management per HD. -Volume overload improved on dialysis. -2D echo on 10/09/2021 with EF of 60 to 65%, NWMA. -Continue HD per nephrology.  4.  Hyponatremia -Being managed on HD. -Per nephrology.  5.  Hyperkalemia -Improved on HD.  6.  Anemia of chronic disease/CKD, component of acute blood loss anemia from wound -H&H stable. - Aranesp per nephrology.  7.  Uncontrolled type 2  diabetes with hyperglycemia -A1c 7.1 07/2021 -CBG 126 this morning. -Continue Semglee, NovoLog, SSI.  8.  Hypertension -Continue Norvasc, Coreg, Lasix. -Patient with soft blood pressure on 10/17/2021 and as  such hydralazine dose has been decreased to 25 mg 3 times daily.   -Follow.  9.  Hyperlipidemia -Statin.  10.  History of CVA -Continue statin.  11.  Depression/anxiety -Stable.   -Continue Cymbalta/Elavil.  12.  GERD -Continue PPI, Pepcid.   13.  Tobacco dependence -Tobacco cessation. -Patient noted to have declined nicotine patch.   DVT prophylaxis: Heparin Code Status: Full Family Communication: Updated patient.  No family at bedside. Disposition: Currently medically stable.  SNF once patient will is able to sit in a chair for dialysis.  TOC following.  Status is: Inpatient Remains inpatient appropriate because: Severity of illness/   Consultants:  Orthopedics: Dr. [name] 08/27/2021 ID: Dr. Gale Journey 08/27/2021 Nephrology: Dr.Schertz 08/27/2021 Vascular surgery: Dr. Unk Lightning 09/06/2021   Procedures:  CT left hip 08/26/2021 Plain films of the right knee 08/26/2021 Plain films of the right foot 08/26/2021 2D echo 10/09/2021 Vascular ultrasound duplex dialysis access 09/07/2021 Fistula revision branch ligation per Dr. Unk Lightning 09/19/2021- Left hip debridement, application of wound VAC by Dr. [name] 08/28/2021  Antimicrobials:  Anti-infectives (From admission, onward)    Start     Dose/Rate Route Frequency Ordered Stop   10/02/21 2000  DAPTOmycin (CUBICIN) 900 mg in sodium chloride 0.9 % IVPB  Status:  Discontinued        10 mg/kg  92.4 kg 136 mL/hr over 30 Minutes Intravenous Once per day on Mon Wed Fri 10/01/21 1350 10/12/21 1146   09/30/21 2000  DAPTOmycin (CUBICIN) 900 mg in sodium chloride 0.9 % IVPB  Status:  Discontinued        10 mg/kg  92.4 kg 136 mL/hr over 30 Minutes Intravenous Daily 09/28/21 0919 10/01/21 1350   09/28/21 2000  DAPTOmycin (CUBICIN) 900 mg in sodium chloride 0.9 % IVPB        900 mg 136 mL/hr over 30 Minutes Intravenous  Once 09/28/21 0919 09/28/21 2146   09/02/21 2000  DAPTOmycin (CUBICIN) 900 mg in sodium chloride 0.9 % IVPB  Status:  Discontinued         10 mg/kg  92.4 kg 136 mL/hr over 30 Minutes Intravenous Once per day on Mon Wed Fri 09/02/21 0836 09/28/21 0919   08/31/21 0745  ceFAZolin (ANCEF) IVPB 2g/100 mL premix        2 g 200 mL/hr over 30 Minutes Intravenous To Short Stay 08/31/21 0730 08/31/21 0918   08/30/21 0600  ceFAZolin (ANCEF) IVPB 2g/100 mL premix  Status:  Discontinued        2 g 200 mL/hr over 30 Minutes Intravenous To Short Stay 08/30/21 0123 08/31/21 0600   08/29/21 2000  DAPTOmycin (CUBICIN) 900 mg in sodium chloride 0.9 % IVPB  Status:  Discontinued        10 mg/kg  92.4 kg 136 mL/hr over 30 Minutes Intravenous Every 48 hours 08/29/21 1627 09/02/21 0836   08/28/21 1800  ceFAZolin (ANCEF) IVPB 2g/100 mL premix  Status:  Discontinued        2 g 200 mL/hr over 30 Minutes Intravenous Every M-W-F (1800) 08/27/21 0846 08/29/21 1620   08/28/21 1200  ceFAZolin (ANCEF) IVPB 1 g/50 mL premix  Status:  Discontinued        1 g 100 mL/hr over 30 Minutes Intravenous Every M-W-F (Hemodialysis) 08/26/21 1954 08/27/21 0846   08/28/21 0600  ceFAZolin (  ANCEF) IVPB 2g/100 mL premix        2 g 200 mL/hr over 30 Minutes Intravenous On call to O.R. 08/27/21 1150 08/28/21 0909   08/27/21 1800  ceFAZolin (ANCEF) IVPB 1 g/50 mL premix        1 g 100 mL/hr over 30 Minutes Intravenous  Once 08/26/21 1954 08/27/21 2048   08/26/21 1500  ceFEPIme (MAXIPIME) 1 g in sodium chloride 0.9 % 100 mL IVPB  Status:  Discontinued        1 g 200 mL/hr over 30 Minutes Intravenous Every 24 hours 08/26/21 1452 08/26/21 1714         Subjective: Sitting up in bed.  No chest pain.  No shortness of breath.  Still with left hip pain however slowly improving.  About to eat lunch.    Objective: Vitals:   10/18/21 2112 10/19/21 0442 10/19/21 0829 10/19/21 1730  BP: (!) 141/57 (!) 134/52 124/75 (!) 127/55  Pulse: 69 64 69 60  Resp: 18  18 16   Temp: (!) 97.5 F (36.4 C) 98.9 F (37.2 C) 99.5 F (37.5 C) 98.2 F (36.8 C)  TempSrc:   Oral Oral  SpO2:  98% 96% 97% 98%  Weight:      Height:        Intake/Output Summary (Last 24 hours) at 10/19/2021 1754 Last data filed at 10/19/2021 1100 Gross per 24 hour  Intake 760 ml  Output 0 ml  Net 760 ml    Filed Weights   10/16/21 0810 10/16/21 1309 10/18/21 0828  Weight: 92.6 kg 88.3 kg 91.5 kg    Examination:  General exam: NAD. Respiratory system: CTA B anterior lung fields.  No wheezes, no crackles, no rhonchi.  Fair air movement.  Speaking in full sentences.  Cardiovascular system: Regular rate rhythm no murmurs rubs or gallops.  No JVD.  No lower extremity edema.  Gastrointestinal system: Abdomen is soft, nontender, nondistended, positive bowel sounds.  No rebound.  No guarding.  Central nervous system: Alert and oriented.  No focal neurological deficits.  Extremities: Symmetric 5 x 5 power.s/p L BKA. LLE with some edema/tightness. RLE with trace edema.  Shrinker on left lower extremity. Skin: No rashes, lesions or ulcers Psychiatry: Judgement and insight appear normal. Mood & affect appropriate.     Data Reviewed: I have personally reviewed following labs and imaging studies  CBC: Recent Labs  Lab 10/16/21 0608 10/18/21 0507  WBC 7.9 7.3  HGB 11.5* 11.7*  HCT 36.9 36.7  MCV 92.3 90.4  PLT 229 202     Basic Metabolic Panel: Recent Labs  Lab 10/16/21 0608 10/17/21 0545 10/18/21 0507 10/19/21 0259  NA 129* 131* 127* 128*  K 5.1 4.8 4.6 4.8  CL 91* 94* 92* 92*  CO2 26 28 26 27   GLUCOSE 108* 121* 117* 123*  BUN 43* 26* 37* 39*  CREATININE 6.00* 4.54* 5.49* 5.36*  CALCIUM 9.1 9.0 8.7* 8.9  PHOS 5.9* 5.6* 6.5* 5.9*     GFR: Estimated Creatinine Clearance: 14.7 mL/min (A) (by C-G formula based on SCr of 5.36 mg/dL (H)).  Liver Function Tests: Recent Labs  Lab 10/16/21 0608 10/17/21 0545 10/18/21 0507 10/19/21 0259  ALBUMIN 2.7* 2.8* 2.8* 2.8*     CBG: Recent Labs  Lab 10/18/21 1644 10/18/21 2111 10/19/21 0719 10/19/21 1138 10/19/21 1649   GLUCAP 188* 241* 126* 130* 114*      No results found for this or any previous visit (from the past 240 hour(s)).  Radiology Studies: No results found.      Scheduled Meds:  alosetron  1 mg Oral BID   amitriptyline  25 mg Oral QHS   amLODipine  10 mg Oral Q24H   atorvastatin  40 mg Oral q1800   carvedilol  6.25 mg Oral BID   Chlorhexidine Gluconate Cloth  6 each Topical Q0600   cyclobenzaprine  10 mg Oral TID   docusate sodium  100 mg Oral BID   DULoxetine  60 mg Oral Daily   famotidine  20 mg Oral Daily   furosemide  80 mg Oral Daily   heparin  5,000 Units Subcutaneous Q8H   hydrALAZINE  25 mg Oral TID   insulin aspart  0-6 Units Subcutaneous TID WC   insulin aspart  5 Units Subcutaneous TID WC   insulin glargine-yfgn  12 Units Subcutaneous QHS   multivitamin  1 tablet Oral QHS   pantoprazole  80 mg Oral Daily   sevelamer carbonate  1,600 mg Oral TID WC   sodium chloride flush  3 mL Intravenous Q12H   Continuous Infusions:  sodium chloride     sodium chloride 10 mL/hr at 09/19/21 1310   methocarbamol (ROBAXIN) IV       LOS: 54 days    Time spent: 35 minutes    Irine Seal, MD [facility]   To contact the attending provider between 7A-7P or the covering provider during after hours 7P-7A, please log into the web site [email or web address] and access using universal [place] password for that web site. If you do not have the password, please call the hospital operator.  10/19/2021, 5:54 PM

## 2021-10-20 DIAGNOSIS — T148XXA Other injury of unspecified body region, initial encounter: Secondary | ICD-10-CM | POA: Diagnosis not present

## 2021-10-20 DIAGNOSIS — K2100 Gastro-esophageal reflux disease with esophagitis, without bleeding: Secondary | ICD-10-CM | POA: Diagnosis not present

## 2021-10-20 DIAGNOSIS — M86252 Subacute osteomyelitis, left femur: Secondary | ICD-10-CM | POA: Diagnosis not present

## 2021-10-20 DIAGNOSIS — F419 Anxiety disorder, unspecified: Secondary | ICD-10-CM | POA: Diagnosis not present

## 2021-10-20 LAB — GLUCOSE, CAPILLARY
Glucose-Capillary: 116 mg/dL — ABNORMAL HIGH (ref 70–99)
Glucose-Capillary: 141 mg/dL — ABNORMAL HIGH (ref 70–99)
Glucose-Capillary: 123 mg/dL — ABNORMAL HIGH (ref 70–99)
Glucose-Capillary: 145 mg/dL — ABNORMAL HIGH (ref 70–99)

## 2021-10-20 MED ORDER — CHLORHEXIDINE GLUCONATE CLOTH 2 % EX PADS
6.0000 | MEDICATED_PAD | Freq: Every day | CUTANEOUS | Status: DC
Start: 2021-10-21 — End: 2021-10-24
  Administered 2021-10-22: 6 via TOPICAL

## 2021-10-20 NOTE — Progress Notes (Signed)
[facility] Progress Note   Subjective:   Patient seen and examined in room.  No new complaints.  Reports she plans to work with OT this week.  Encouraged to try sitting in the chair. Denies CP, SOB, abdomina pain and n/v/d.   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, RLE w/compression sock, no edema Dialysis Access: LU AVF +b/t   Filed Weights   10/16/21 0810 10/16/21 1309 10/18/21 0828  Weight: 92.6 kg 88.3 kg 91.5 kg    Intake/Output Summary (Last 24 hours) at 10/20/2021 1229 Last data filed at 10/20/2021 0558 Gross per 24 hour  Intake 0 ml  Output 375 ml  Net -375 ml    Additional Objective Labs: Basic Metabolic Panel: Recent Labs  Lab 10/17/21 0545 10/18/21 0507 10/19/21 0259  NA 131* 127* 128*  K 4.8 4.6 4.8  CL 94* 92* 92*  CO2 28 26 27   GLUCOSE 121* 117* 123*  BUN 26* 37* 39*  CREATININE 4.54* 5.49* 5.36*  CALCIUM 9.0 8.7* 8.9  PHOS 5.6* 6.5* 5.9*   Liver Function Tests: Recent Labs  Lab 10/17/21 0545 10/18/21 0507 10/19/21 0259  ALBUMIN 2.8* 2.8* 2.8*   CBC: Recent Labs  Lab 10/16/21 0608 10/18/21 0507  WBC 7.9 7.3  HGB 11.5* 11.7*  HCT 36.9 36.7  MCV 92.3 90.4  PLT 229 202   CBG: Recent Labs  Lab 10/19/21 1138 10/19/21 1649 10/19/21 2105 10/20/21 0729 10/20/21 1142  GLUCAP 130* 114* 105* 116* 141*   Medications:  sodium chloride     sodium chloride 10 mL/hr at 09/19/21 1310   methocarbamol (ROBAXIN) IV      alosetron  1 mg Oral BID   amitriptyline  25 mg Oral QHS    amLODipine  10 mg Oral Q24H   atorvastatin  40 mg Oral q1800   carvedilol  6.25 mg Oral BID   Chlorhexidine Gluconate Cloth  6 each Topical Q0600   cyclobenzaprine  10 mg Oral TID   docusate sodium  100 mg Oral BID   DULoxetine  60 mg Oral Daily   famotidine  20 mg Oral Daily   furosemide  80 mg Oral Daily   heparin  5,000 Units Subcutaneous Q8H   hydrALAZINE  25 mg Oral TID   insulin aspart  0-6 Units Subcutaneous TID WC   insulin aspart  5 Units Subcutaneous TID WC   insulin glargine-yfgn  12 Units Subcutaneous QHS   multivitamin  1 tablet Oral QHS   pantoprazole  80 mg Oral Daily   sevelamer carbonate  1,600 mg Oral TID WC   sodium chloride flush  3 mL Intravenous Q12H    Dialysis Orders: MWF at [facility], 400/500, EDW 90kg, 2K/2Ca, LUE AVF, heparin 4000 unit bolus   Assessment/Plan: 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 Back to OR 08/28/21, 08/31/21 for L hip debridement and wound vac placement (now removed). Finished 6 week  course of IV Daptomycin on 10/12/21. 2. ESRD: Continue HD on MWF schedule. Intermittent AVF cannulation issues but working well recently. Will continue to monitor. Will continue to encourage to sit in chair for HD.  3. AVF Access: S/p branch ligation in OR 09/19/2021 per Dr. Virl Cagey.  4. HTN/ volume: BP controlled. On amlodipine/hydralazine/Coreg/Lasix. Still with edema but improved. Max UF as tolerated and has been reminded of importance of following fluid and sodium restrictions.  5. Anemia of ESRD: Hgb 11.7. D/c ESA.  6. Secondary HPTH: Cca controlled. Phos slightly elevated. Continue Renvela as binder. No VDRA for now. 7. DM2: Insulin per primary 8. Dispo: SNF recommended.  Per PT notes -now willing to go to SNF. Very difficult situation.  Not clear what chance of success she will have as OP regardless of her ability to sit in chair if her home situation is not improved. She says her goal is to be able to walk prior to  discharge.   Jen Mow, PA-C [facility] 10/20/2021,12:29 PM  LOS: 55 days

## 2021-10-20 NOTE — Progress Notes (Signed)
PROGRESS NOTE    [name]  DXA:[id number] DOB: 07-05-1965 DOA: 08/26/2021 PCP: [name], MD    Chief Complaint  Patient presents with   Wound Infection    Brief Narrative:  [name] was admitted to the hospital with the working diagnosis of subacute osteomyelitis left intertrochanteric fracture.    56 year old woman PMH including ESRD, complicated left hip and femur issues requiring multiple surgeries, removal of hardware presented 6/26 with bloody drainage from surgical incision.  Seen by orthopedics and underwent I&D 6/28 and 7/1.  Currently unable to progress care secondary to refusal to sit in chair for HD.     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  Active Problems:   ESRD (end stage renal disease) on dialysis (HCC)   Type 2 diabetes mellitus with hyperlipidemia (HCC)   History of CVA (cerebrovascular accident)   Essential hypertension   Anxiety and depression   GASTROESOPHAGEAL REFLUX, NO ESOPHAGITIS   TOBACCO DEPENDENCE   Wound infection   Wound dehiscence, surgical, sequela  #1 subacute osteomyelitis left femur with abscess/infected left hip hardware status post removal/left intertrochanteric fracture -Patient seen and followed by orthopedics during the hospitalization, patient underwent left hip debridement, application of wound VAC by Dr. Gavin Potters 08/28/2021. -Status post wound VAC removal 10/05/18/2023 per orthopedics. -Continue wound care. -Patient cleared by orthopedics from their standpoint for discharge with outpatient follow-up. -Patient seen by ID and daptomycin x6 weeks recommended with hemodialysis with stop date 10/12/2021 which has been completed. -Patient seen later on during the hospitalization by Dr. [name] who recommended lymphedema pump as outpatient, has pump cannot be arranged in-house. -Generalized edema improving with dialysis. -Outpatient follow-up with orthopedics, ID, nephrology.  2.   ESRD on dialysis -Patient on HD Monday Wednesday Friday needs to be able to sit in the chair to tolerate outpatient dialysis prior to discharge. -Patient seen by vascular surgery status post fistula revision and branch ligation. -Patient could not tolerate HD. -Case discussed with nephrology who recommended SNF as patient would not be safe for home DC (no ramp to get into home) -Continue current fluid restrictions, concerns regarding compliance with fluid intake. -Patient noted on HD on 8/7 to have brief run of 2 hours, reported mention problems asked to be taken off. -It is noted per Dr. Rhetta Mura note that patient stated she is still not ready to have HD in the chair for several complaints including pain, fluid and weakness. -Currently on Lasix 80 mg daily. -On hemodialysis. -Nephrology following and per nephrology patient with issues with no handicap ramp at home, outpatient HD clinic cannot help her get in and out of the car when she arrives to HD.  They indicate is not clear what chance of success she will have in the outpatient regardless of her ability to sit in chair if her home situation is not improved. -Likely needs SNF placement. -Patient stated was able to sit up in chair for 1 hour and 20 minutes on 10/17/2021. -On HD. -Per nephrology.  3.  Anasarca -Secondary to ESRD and volume overload. -Volume management per HD. -Volume overload improved on dialysis. -2D echo on 10/09/2021 with EF of 60 to 65%, NWMA. -Continue HD per nephrology.  4.  Hyponatremia -Being managed on HD. -Per nephrology.  5.  Hyperkalemia -Improved on HD.  6.  Anemia of chronic disease/CKD, component of acute blood loss anemia from wound -H&H stable. - Aranesp per nephrology.  7.  Uncontrolled type 2  diabetes with hyperglycemia -A1c 7.1 07/2021 -CBG 116 this morning. -Continue Semglee, NovoLog, SSI.  8.  Hypertension -Continue Norvasc, Coreg, Lasix. -Patient with soft blood pressure on 10/17/2021 and as  such hydralazine dose has been decreased to 25 mg 3 times daily.   -Follow.  9.  Hyperlipidemia -Continue statin.    10.  History of CVA -Statin.  11.  Depression/anxiety -Stable.   -Continue Cymbalta/Elavil.  12.  GERD -Pepcid, PPI.   13.  Tobacco dependence -Tobacco cessation. -Patient noted to have declined nicotine patch.   DVT prophylaxis: Heparin Code Status: Full Family Communication: Updated patient.  No family at bedside. Disposition: Currently medically stable.  SNF once patient is able to sit in a chair for dialysis.  TOC following.  Status is: Inpatient Remains inpatient appropriate because: Severity of illness/   Consultants:  Orthopedics: Dr. [name] 08/27/2021 ID: Dr. Gale Journey 08/27/2021 Nephrology: Dr.Schertz 08/27/2021 Vascular surgery: Dr. Unk Lightning 09/06/2021   Procedures:  CT left hip 08/26/2021 Plain films of the right knee 08/26/2021 Plain films of the right foot 08/26/2021 2D echo 10/09/2021 Vascular ultrasound duplex dialysis access 09/07/2021 Fistula revision branch ligation per Dr. Unk Lightning 09/19/2021- Left hip debridement, application of wound VAC by Dr. [name] 08/28/2021  Antimicrobials:  Anti-infectives (From admission, onward)    Start     Dose/Rate Route Frequency Ordered Stop   10/02/21 2000  DAPTOmycin (CUBICIN) 900 mg in sodium chloride 0.9 % IVPB  Status:  Discontinued        10 mg/kg  92.4 kg 136 mL/hr over 30 Minutes Intravenous Once per day on Mon Wed Fri 10/01/21 1350 10/12/21 1146   09/30/21 2000  DAPTOmycin (CUBICIN) 900 mg in sodium chloride 0.9 % IVPB  Status:  Discontinued        10 mg/kg  92.4 kg 136 mL/hr over 30 Minutes Intravenous Daily 09/28/21 0919 10/01/21 1350   09/28/21 2000  DAPTOmycin (CUBICIN) 900 mg in sodium chloride 0.9 % IVPB        900 mg 136 mL/hr over 30 Minutes Intravenous  Once 09/28/21 0919 09/28/21 2146   09/02/21 2000  DAPTOmycin (CUBICIN) 900 mg in sodium chloride 0.9 % IVPB  Status:  Discontinued        10 mg/kg   92.4 kg 136 mL/hr over 30 Minutes Intravenous Once per day on Mon Wed Fri 09/02/21 0836 09/28/21 0919   08/31/21 0745  ceFAZolin (ANCEF) IVPB 2g/100 mL premix        2 g 200 mL/hr over 30 Minutes Intravenous To Short Stay 08/31/21 0730 08/31/21 0918   08/30/21 0600  ceFAZolin (ANCEF) IVPB 2g/100 mL premix  Status:  Discontinued        2 g 200 mL/hr over 30 Minutes Intravenous To Short Stay 08/30/21 0123 08/31/21 0600   08/29/21 2000  DAPTOmycin (CUBICIN) 900 mg in sodium chloride 0.9 % IVPB  Status:  Discontinued        10 mg/kg  92.4 kg 136 mL/hr over 30 Minutes Intravenous Every 48 hours 08/29/21 1627 09/02/21 0836   08/28/21 1800  ceFAZolin (ANCEF) IVPB 2g/100 mL premix  Status:  Discontinued        2 g 200 mL/hr over 30 Minutes Intravenous Every M-W-F (1800) 08/27/21 0846 08/29/21 1620   08/28/21 1200  ceFAZolin (ANCEF) IVPB 1 g/50 mL premix  Status:  Discontinued        1 g 100 mL/hr over 30 Minutes Intravenous Every M-W-F (Hemodialysis) 08/26/21 1954 08/27/21 0846   08/28/21 0600  ceFAZolin (  ANCEF) IVPB 2g/100 mL premix        2 g 200 mL/hr over 30 Minutes Intravenous On call to O.R. 08/27/21 1150 08/28/21 0909   08/27/21 1800  ceFAZolin (ANCEF) IVPB 1 g/50 mL premix        1 g 100 mL/hr over 30 Minutes Intravenous  Once 08/26/21 1954 08/27/21 2048   08/26/21 1500  ceFEPIme (MAXIPIME) 1 g in sodium chloride 0.9 % 100 mL IVPB  Status:  Discontinued        1 g 200 mL/hr over 30 Minutes Intravenous Every 24 hours 08/26/21 1452 08/26/21 1714         Subjective: Sitting up in bed.  Complaining of left hip pain.  Feels overall swelling is improving.  No chest pain.  No shortness of breath.   Objective: Vitals:   10/19/21 0829 10/19/21 1730 10/19/21 2104 10/20/21 0558  BP: 124/75 (!) 127/55 (!) 143/62 (!) 152/57  Pulse: 69 60 63 (!) 58  Resp: 18 16 18 18   Temp: 99.5 F (37.5 C) 98.2 F (36.8 C) 98.3 F (36.8 C) 97.8 F (36.6 C)  TempSrc: Oral Oral Oral   SpO2: 97% 98%  95% 98%  Weight:      Height:        Intake/Output Summary (Last 24 hours) at 10/20/2021 1241 Last data filed at 10/20/2021 1100 Gross per 24 hour  Intake 440 ml  Output 375 ml  Net 65 ml    Filed Weights   10/16/21 0810 10/16/21 1309 10/18/21 0828  Weight: 92.6 kg 88.3 kg 91.5 kg    Examination:  General exam: NAD. Respiratory system: Lungs clear to auscultation bilaterally.  No wheezes, no crackles, no rhonchi.  Fair air movement.  Speaking in full sentences.   Cardiovascular system: RRR no murmurs rubs or gallops.  No JVD.  Gastrointestinal system: Abdomen is soft, nontender, nondistended, positive bowel sounds.  No rebound.  No guarding.  Central nervous system: Alert and oriented.  No focal neurological deficits.  Extremities: Symmetric 5 x 5 power.s/p L BKA. LLE with some edema/tightness around the hip area.Marland Kitchen RLE with trace edema.  Shrinker on left lower extremity. Skin: No rashes, lesions or ulcers Psychiatry: Judgement and insight appear normal. Mood & affect appropriate.     Data Reviewed: I have personally reviewed following labs and imaging studies  CBC: Recent Labs  Lab 10/16/21 0608 10/18/21 0507  WBC 7.9 7.3  HGB 11.5* 11.7*  HCT 36.9 36.7  MCV 92.3 90.4  PLT 229 202     Basic Metabolic Panel: Recent Labs  Lab 10/16/21 0608 10/17/21 0545 10/18/21 0507 10/19/21 0259  NA 129* 131* 127* 128*  K 5.1 4.8 4.6 4.8  CL 91* 94* 92* 92*  CO2 26 28 26 27   GLUCOSE 108* 121* 117* 123*  BUN 43* 26* 37* 39*  CREATININE 6.00* 4.54* 5.49* 5.36*  CALCIUM 9.1 9.0 8.7* 8.9  PHOS 5.9* 5.6* 6.5* 5.9*     GFR: Estimated Creatinine Clearance: 14.7 mL/min (A) (by C-G formula based on SCr of 5.36 mg/dL (H)).  Liver Function Tests: Recent Labs  Lab 10/16/21 0608 10/17/21 0545 10/18/21 0507 10/19/21 0259  ALBUMIN 2.7* 2.8* 2.8* 2.8*     CBG: Recent Labs  Lab 10/19/21 1138 10/19/21 1649 10/19/21 2105 10/20/21 0729 10/20/21 1142  GLUCAP 130* 114*  105* 116* 141*      No results found for this or any previous visit (from the past 240 hour(s)).  Radiology Studies: No results found.      Scheduled Meds:  alosetron  1 mg Oral BID   amitriptyline  25 mg Oral QHS   amLODipine  10 mg Oral Q24H   atorvastatin  40 mg Oral q1800   carvedilol  6.25 mg Oral BID   [START ON 10/21/2021] Chlorhexidine Gluconate Cloth  6 each Topical Q0600   cyclobenzaprine  10 mg Oral TID   docusate sodium  100 mg Oral BID   DULoxetine  60 mg Oral Daily   famotidine  20 mg Oral Daily   furosemide  80 mg Oral Daily   heparin  5,000 Units Subcutaneous Q8H   hydrALAZINE  25 mg Oral TID   insulin aspart  0-6 Units Subcutaneous TID WC   insulin aspart  5 Units Subcutaneous TID WC   insulin glargine-yfgn  12 Units Subcutaneous QHS   multivitamin  1 tablet Oral QHS   pantoprazole  80 mg Oral Daily   sevelamer carbonate  1,600 mg Oral TID WC   sodium chloride flush  3 mL Intravenous Q12H   Continuous Infusions:  sodium chloride     sodium chloride 10 mL/hr at 09/19/21 1310   methocarbamol (ROBAXIN) IV       LOS: 55 days    Time spent: 35 minutes    Irine Seal, MD [facility]   To contact the attending provider between 7A-7P or the covering provider during after hours 7P-7A, please log into the web site [email or web address] and access using universal [place] password for that web site. If you do not have the password, please call the hospital operator.  10/20/2021, 12:41 PM

## 2021-10-21 DIAGNOSIS — M86252 Subacute osteomyelitis, left femur: Secondary | ICD-10-CM | POA: Diagnosis not present

## 2021-10-21 DIAGNOSIS — F419 Anxiety disorder, unspecified: Secondary | ICD-10-CM | POA: Diagnosis not present

## 2021-10-21 DIAGNOSIS — T148XXA Other injury of unspecified body region, initial encounter: Secondary | ICD-10-CM | POA: Diagnosis not present

## 2021-10-21 DIAGNOSIS — K2100 Gastro-esophageal reflux disease with esophagitis, without bleeding: Secondary | ICD-10-CM | POA: Diagnosis not present

## 2021-10-21 LAB — RENAL FUNCTION PANEL
Albumin: 2.8 g/dL — ABNORMAL LOW (ref 3.5–5.0)
Albumin: 2.9 g/dL — ABNORMAL LOW (ref 3.5–5.0)
Anion gap: 11 (ref 5–15)
Anion gap: 14 (ref 5–15)
BUN: 71 mg/dL — ABNORMAL HIGH (ref 6–20)
BUN: 73 mg/dL — ABNORMAL HIGH (ref 6–20)
CO2: 24 mmol/L (ref 22–32)
CO2: 22 mmol/L (ref 22–32)
Calcium: 8.6 mg/dL — ABNORMAL LOW (ref 8.9–10.3)
Calcium: 8.6 mg/dL — ABNORMAL LOW (ref 8.9–10.3)
Chloride: 92 mmol/L — ABNORMAL LOW (ref 98–111)
Chloride: 88 mmol/L — ABNORMAL LOW (ref 98–111)
Creatinine, Ser: 7.33 mg/dL — ABNORMAL HIGH (ref 0.44–1.00)
Creatinine, Ser: 7.48 mg/dL — ABNORMAL HIGH (ref 0.44–1.00)
GFR, Estimated: 6 mL/min — ABNORMAL LOW (ref >60)
GFR, Estimated: 6 mL/min — ABNORMAL LOW (ref >60)
Glucose, Bld: 94 mg/dL (ref 70–99)
Glucose, Bld: 122 mg/dL — ABNORMAL HIGH (ref 70–99)
Phosphorus: 7.7 mg/dL — ABNORMAL HIGH (ref 2.5–4.6)
Phosphorus: 7.5 mg/dL — ABNORMAL HIGH (ref 2.5–4.6)
Potassium: 5.6 mmol/L — ABNORMAL HIGH (ref 3.5–5.1)
Potassium: 5.6 mmol/L — ABNORMAL HIGH (ref 3.5–5.1)
Sodium: 127 mmol/L — ABNORMAL LOW (ref 135–145)
Sodium: 124 mmol/L — ABNORMAL LOW (ref 135–145)

## 2021-10-21 LAB — CBC
HCT: 38.2 % (ref 36.0–46.0)
HCT: 37.1 % (ref 36.0–46.0)
Hemoglobin: 11.9 g/dL — ABNORMAL LOW (ref 12.0–15.0)
Hemoglobin: 11.6 g/dL — ABNORMAL LOW (ref 12.0–15.0)
MCH: 28 pg (ref 26.0–34.0)
MCH: 28.4 pg (ref 26.0–34.0)
MCHC: 31.2 g/dL (ref 30.0–36.0)
MCHC: 31.3 g/dL (ref 30.0–36.0)
MCV: 89.9 fL (ref 80.0–100.0)
MCV: 90.9 fL (ref 80.0–100.0)
Platelets: 211 10*3/uL (ref 150–400)
Platelets: 219 10*3/uL (ref 150–400)
RBC: 4.25 MIL/uL (ref 3.87–5.11)
RBC: 4.08 MIL/uL (ref 3.87–5.11)
RDW: 15.8 % — ABNORMAL HIGH (ref 11.5–15.5)
RDW: 15.8 % — ABNORMAL HIGH (ref 11.5–15.5)
WBC: 7.5 10*3/uL (ref 4.0–10.5)
WBC: 6.9 10*3/uL (ref 4.0–10.5)
nRBC: 0 % (ref 0.0–0.2)
nRBC: 0 % (ref 0.0–0.2)

## 2021-10-21 LAB — GLUCOSE, CAPILLARY
Glucose-Capillary: 101 mg/dL — ABNORMAL HIGH (ref 70–99)
Glucose-Capillary: 180 mg/dL — ABNORMAL HIGH (ref 70–99)
Glucose-Capillary: 188 mg/dL — ABNORMAL HIGH (ref 70–99)

## 2021-10-21 MED ORDER — ALTEPLASE 2 MG IJ SOLR: 2.0000 mg | Freq: Once | INTRAMUSCULAR | Status: DC | PRN

## 2021-10-21 MED ORDER — ANTICOAGULANT SODIUM CITRATE 4% (200MG/5ML) IV SOLN
5.0000 mL | Status: DC | PRN
  Filled 2021-10-21: qty 5

## 2021-10-21 MED ORDER — PENTAFLUOROPROP-TETRAFLUOROETH EX AERO: 1.0000 | INHALATION_SPRAY | CUTANEOUS | Status: DC | PRN

## 2021-10-21 MED ORDER — HEPARIN SODIUM (PORCINE) 1000 UNIT/ML DIALYSIS
4000.0000 [IU] | INTRAMUSCULAR | Status: DC | PRN
  Administered 2021-10-21: 4000 [IU] via INTRAVENOUS_CENTRAL
  Filled 2021-10-21: qty 4

## 2021-10-21 MED ORDER — HEPARIN SODIUM (PORCINE) 1000 UNIT/ML DIALYSIS: 1000.0000 [IU] | INTRAMUSCULAR | Status: DC | PRN

## 2021-10-21 NOTE — Progress Notes (Signed)
Received patient in bed to unit. Alert and oriented. Informed consent signed and in chart.   Treatment initiated: 1130 Treatment completed: 1702   Patient tolerated well. Transported back to the room alert, without acute distress. Report given to patient's floor nurse.   Access used: LUA Fistula  Access issues: none   Total UF removed: 5000 Medication(s) given: Heparin Post HD VS: 107/67 70 10 98.9 96%  Post HD weight: 88.7kg    [facility]

## 2021-10-21 NOTE — Progress Notes (Signed)
PROGRESS NOTE    Donna DEVERA  Martinez:[id number] DOB: [birth year] DOA: 08/26/2021 PCP: [name], MD    Chief Complaint  Patient presents with   Wound Infection    Brief Narrative:  Mrs. Haber was admitted to the hospital with the working diagnosis of subacute osteomyelitis left intertrochanteric fracture.    56 year old woman PMH including ESRD, complicated left hip and femur issues requiring multiple surgeries, removal of hardware presented 6/26 with bloody drainage from surgical incision.  Seen by orthopedics and underwent I&D 6/28 and 7/1.  Currently unable to progress care secondary to refusal to sit in chair for HD.     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  Active Problems:   ESRD (end stage renal disease) on dialysis (HCC)   Type 2 diabetes mellitus with hyperlipidemia (HCC)   History of CVA (cerebrovascular accident)   Essential hypertension   Anxiety and depression   GASTROESOPHAGEAL REFLUX, NO ESOPHAGITIS   TOBACCO DEPENDENCE   Wound infection   Wound dehiscence, surgical, sequela  #1 subacute osteomyelitis left femur with abscess/infected left hip hardware status post removal/left intertrochanteric fracture -Patient seen and followed by orthopedics during the hospitalization, patient underwent left hip debridement, application of wound VAC by Dr. Gavin Potters 08/28/2021. -Status post wound VAC removal 10/05/18/2023 per orthopedics. -Continue wound care. -Patient cleared by orthopedics from their standpoint for discharge with outpatient follow-up. -Patient seen by ID and daptomycin x6 weeks recommended with hemodialysis with stop date 10/12/2021 which has been completed. -Patient seen later on during the hospitalization by Dr. [name] who recommended lymphedema pump as outpatient, has pump cannot be arranged in-house. -Generalized edema improving with dialysis. -Outpatient follow-up with orthopedics, ID, nephrology.  2.   ESRD on dialysis -Patient on HD Monday Wednesday Friday needs to be able to sit in the chair to tolerate outpatient dialysis prior to discharge. -Patient seen by vascular surgery status post fistula revision and branch ligation. -Patient could not tolerate HD. -Case discussed with nephrology who recommended SNF as patient would not be safe for home DC (no ramp to get into home) -Continue current fluid restrictions, concerns regarding compliance with fluid intake. -Patient noted on HD on 8/7 to have brief run of 2 hours, reported mention problems asked to be taken off. -It is noted per Dr. Rhetta Mura note that patient stated she is still not ready to have HD in the chair for several complaints including pain, fluid and weakness. -Currently on Lasix 80 mg daily. -On hemodialysis. -Nephrology following and per nephrology patient with issues with no handicap ramp at home, outpatient HD clinic cannot help her get in and out of the car when she arrives to HD.  They indicate is not clear what chance of success she will have in the outpatient regardless of her ability to sit in chair if her home situation is not improved. -Likely needs SNF placement. -Patient stated was able to sit up in chair for 1 hour and 20 minutes on 10/17/2021. -Patient stated sat in chair yesterday, 10/20/2021 for 2 to 3 hours. -On HD. -Per nephrology.  3.  Anasarca -Secondary to ESRD and volume overload. -Volume management per HD. -Volume overload improved on dialysis. -2D echo on 10/09/2021 with EF of 60 to 65%, NWMA. -Continue HD per nephrology.  4.  Hyponatremia -Being managed on HD. -Per nephrology.  5.  Hyperkalemia -Improved on HD.  6.  Anemia of chronic disease/CKD, component of acute blood loss anemia from wound -  H&H stable. - Aranesp per nephrology.  7.  Uncontrolled type 2 diabetes with hyperglycemia -A1c 7.1 07/2021 -CBG 101 this morning. -Continue Semglee, NovoLog, SSI.  8.  Hypertension -Continue Norvasc,  Coreg, Lasix. -Patient with soft blood pressure on 10/17/2021 and as such hydralazine dose has been decreased to 25 mg 3 times daily.   -Follow.  9.  Hyperlipidemia -Continue statin.    10.  History of CVA -Statin.  11.  Depression/anxiety -Stable.   -Elavil/Cymbalta  12.  GERD -PPI, Pepcid.   13.  Tobacco dependence -Tobacco cessation. -Patient noted to have declined nicotine patch.   DVT prophylaxis: Heparin Code Status: Full Family Communication: Updated patient.  No family at bedside. Disposition: Currently medically stable.  SNF once patient is able to sit in a chair for dialysis.  TOC following.  Status is: Inpatient Remains inpatient appropriate because: Severity of illness/   Consultants:  Orthopedics: Dr. [name] 08/27/2021 ID: Dr. Gale Journey 08/27/2021 Nephrology: Dr.Schertz 08/27/2021 Vascular surgery: Dr. Unk Lightning 09/06/2021   Procedures:  CT left hip 08/26/2021 Plain films of the right knee 08/26/2021 Plain films of the right foot 08/26/2021 2D echo 10/09/2021 Vascular ultrasound duplex dialysis access 09/07/2021 Fistula revision branch ligation per Dr. Unk Lightning 09/19/2021- Left hip debridement, application of wound VAC by Dr. [name] 08/28/2021  Antimicrobials:  Anti-infectives (From admission, onward)    Start     Dose/Rate Route Frequency Ordered Stop   10/02/21 2000  DAPTOmycin (CUBICIN) 900 mg in sodium chloride 0.9 % IVPB  Status:  Discontinued        10 mg/kg  92.4 kg 136 mL/hr over 30 Minutes Intravenous Once per day on Mon Wed Fri 10/01/21 1350 10/12/21 1146   09/30/21 2000  DAPTOmycin (CUBICIN) 900 mg in sodium chloride 0.9 % IVPB  Status:  Discontinued        10 mg/kg  92.4 kg 136 mL/hr over 30 Minutes Intravenous Daily 09/28/21 0919 10/01/21 1350   09/28/21 2000  DAPTOmycin (CUBICIN) 900 mg in sodium chloride 0.9 % IVPB        900 mg 136 mL/hr over 30 Minutes Intravenous  Once 09/28/21 0919 09/28/21 2146   09/02/21 2000  DAPTOmycin (CUBICIN) 900 mg in sodium  chloride 0.9 % IVPB  Status:  Discontinued        10 mg/kg  92.4 kg 136 mL/hr over 30 Minutes Intravenous Once per day on Mon Wed Fri 09/02/21 0836 09/28/21 0919   08/31/21 0745  ceFAZolin (ANCEF) IVPB 2g/100 mL premix        2 g 200 mL/hr over 30 Minutes Intravenous To Short Stay 08/31/21 0730 08/31/21 0918   08/30/21 0600  ceFAZolin (ANCEF) IVPB 2g/100 mL premix  Status:  Discontinued        2 g 200 mL/hr over 30 Minutes Intravenous To Short Stay 08/30/21 0123 08/31/21 0600   08/29/21 2000  DAPTOmycin (CUBICIN) 900 mg in sodium chloride 0.9 % IVPB  Status:  Discontinued        10 mg/kg  92.4 kg 136 mL/hr over 30 Minutes Intravenous Every 48 hours 08/29/21 1627 09/02/21 0836   08/28/21 1800  ceFAZolin (ANCEF) IVPB 2g/100 mL premix  Status:  Discontinued        2 g 200 mL/hr over 30 Minutes Intravenous Every M-W-F (1800) 08/27/21 0846 08/29/21 1620   08/28/21 1200  ceFAZolin (ANCEF) IVPB 1 g/50 mL premix  Status:  Discontinued        1 g 100 mL/hr over 30 Minutes Intravenous Every M-W-F (  Hemodialysis) 08/26/21 1954 08/27/21 0846   08/28/21 0600  ceFAZolin (ANCEF) IVPB 2g/100 mL premix        2 g 200 mL/hr over 30 Minutes Intravenous On call to O.R. 08/27/21 1150 08/28/21 0909   08/27/21 1800  ceFAZolin (ANCEF) IVPB 1 g/50 mL premix        1 g 100 mL/hr over 30 Minutes Intravenous  Once 08/26/21 1954 08/27/21 2048   08/26/21 1500  ceFEPIme (MAXIPIME) 1 g in sodium chloride 0.9 % 100 mL IVPB  Status:  Discontinued        1 g 200 mL/hr over 30 Minutes Intravenous Every 24 hours 08/26/21 1452 08/26/21 1714         Subjective: In hemodialysis.  Sleeping.  Easily arousable.  Denies any chest pain or shortness of breath.  Still with left hip pain however overall slowly improving.  Patient stated sat in chair yesterday for about 2 to 3 hours.  Objective: Vitals:   10/21/21 1530 10/21/21 1600 10/21/21 1630 10/21/21 1702  BP: (!) 114/59 110/62 104/67 107/67  Pulse: 67 66 70 70  Resp:  12 (!) 8 (!) 9 10  Temp:    98.9 F (37.2 C)  TempSrc:      SpO2: 98% 100% 97% 96%  Weight:    88.7 kg  Height:        Intake/Output Summary (Last 24 hours) at 10/21/2021 1756 Last data filed at 10/21/2021 1702 Gross per 24 hour  Intake 340 ml  Output 5900 ml  Net -5560 ml    Filed Weights   10/18/21 0828 10/21/21 1122 10/21/21 1702  Weight: 91.5 kg 93 kg 88.7 kg    Examination:  General exam: NAD Respiratory system: CTA B anterior lung fields..  No wheezes, no crackles, no rhonchi. Cardiovascular system: Regular rate rhythm no murmurs rubs or gallops.  No JVD.  Gastrointestinal system: Abdomen is soft, nontender, nondistended, positive bowel sounds.  No rebound.  No guarding.  Central nervous system: Alert and oriented.  No focal neurological deficits.  Extremities: Symmetric 5 x 5 power.s/p L BKA. LLE with some edema/tightness around the hip area.Marland Kitchen RLE with trace edema.  Shrinker on left lower extremity. Skin: No rashes, lesions or ulcers Psychiatry: Judgement and insight appear normal. Mood & affect appropriate.     Data Reviewed: I have personally reviewed following labs and imaging studies  CBC: Recent Labs  Lab 10/16/21 0608 10/18/21 0507 10/21/21 0438 10/21/21 1156  WBC 7.9 7.3 7.5 6.9  HGB 11.5* 11.7* 11.9* 11.6*  HCT 36.9 36.7 38.2 37.1  MCV 92.3 90.4 89.9 90.9  PLT 229 202 211 219     Basic Metabolic Panel: Recent Labs  Lab 10/17/21 0545 10/18/21 0507 10/19/21 0259 10/21/21 0438 10/21/21 1157  NA 131* 127* 128* 127* 124*  K 4.8 4.6 4.8 5.6* 5.6*  CL 94* 92* 92* 92* 88*  CO2 28 26 27 24 22   GLUCOSE 121* 117* 123* 94 122*  BUN 26* 37* 39* 71* 73*  CREATININE 4.54* 5.49* 5.36* 7.33* 7.48*  CALCIUM 9.0 8.7* 8.9 8.6* 8.6*  PHOS 5.6* 6.5* 5.9* 7.7* 7.5*     GFR: Estimated Creatinine Clearance: 10.4 mL/min (A) (by C-G formula based on SCr of 7.48 mg/dL (H)).  Liver Function Tests: Recent Labs  Lab 10/17/21 0545 10/18/21 0507  10/19/21 0259 10/21/21 0438 10/21/21 1157  ALBUMIN 2.8* 2.8* 2.8* 2.8* 2.9*     CBG: Recent Labs  Lab 10/20/21 0729 10/20/21 1142 10/20/21 1647 10/20/21 2154  10/21/21 0740  GLUCAP 116* 141* 123* 145* 101*      No results found for this or any previous visit (from the past 240 hour(s)).       Radiology Studies: No results found.      Scheduled Meds:  alosetron  1 mg Oral BID   amitriptyline  25 mg Oral QHS   amLODipine  10 mg Oral Q24H   atorvastatin  40 mg Oral q1800   carvedilol  6.25 mg Oral BID   Chlorhexidine Gluconate Cloth  6 each Topical Q0600   cyclobenzaprine  10 mg Oral TID   docusate sodium  100 mg Oral BID   DULoxetine  60 mg Oral Daily   famotidine  20 mg Oral Daily   furosemide  80 mg Oral Daily   heparin  5,000 Units Subcutaneous Q8H   hydrALAZINE  25 mg Oral TID   insulin aspart  0-6 Units Subcutaneous TID WC   insulin aspart  5 Units Subcutaneous TID WC   insulin glargine-yfgn  12 Units Subcutaneous QHS   multivitamin  1 tablet Oral QHS   pantoprazole  80 mg Oral Daily   sevelamer carbonate  1,600 mg Oral TID WC   sodium chloride flush  3 mL Intravenous Q12H   Continuous Infusions:  sodium chloride     sodium chloride 10 mL/hr at 09/19/21 1310   methocarbamol (ROBAXIN) IV       LOS: 56 days    Time spent: 35 minutes    Irine Seal, MD [facility]   To contact the attending provider between 7A-7P or the covering provider during after hours 7P-7A, please log into the web site [email or web address] and access using universal [place] password for that web site. If you do not have the password, please call the hospital operator.  10/21/2021, 5:57 PM

## 2021-10-21 NOTE — Plan of Care (Signed)
  Problem: Pain Managment: Goal: General experience of comfort will improve Outcome: Progressing   Problem: Activity: Goal: Risk for activity intolerance will decrease Outcome: Not Progressing

## 2021-10-21 NOTE — Progress Notes (Signed)
[facility] Progress Note   Subjective:   Seen in room, getting ready to go to HD soon. Says she was able to sit for about 2 hours yesterday before having back pain. Denies SOB, CP, dizziness, abdominal pain and nausea. Still some edema in L hip.   Objective Vitals:   10/20/21 1528 10/20/21 2155 10/21/21 0527 10/21/21 0920  BP: 111/85 126/66 (!) 150/59 (!) 151/60  Pulse: (!) 47  62 64  Resp: 18 18 18 19   Temp: 98.1 F (36.7 C) 98.5 F (36.9 C) 98 F (36.7 C) 98 F (36.7 C)  TempSrc: Oral Oral Oral Oral  SpO2: 97% 97% 99% 100%  Weight:      Height:       Physical Exam General: Well appearing female, alert and in NAD Heart: RRR, no murmurs, rubs or gallops Lungs: CTA bilaterally without wheezing, rhonchi or rales Abdomen: Soft, non-tender, non-distended. +L side flank edema Extremities: L BKA 2+ edema, RLE no edema Dialysis Access:  LUE AVF + bruit  Additional Objective Labs: Basic Metabolic Panel: Recent Labs  Lab 10/18/21 0507 10/19/21 0259 10/21/21 0438  NA 127* 128* 127*  K 4.6 4.8 5.6*  CL 92* 92* 92*  CO2 26 27 24   GLUCOSE 117* 123* 94  BUN 37* 39* 71*  CREATININE 5.49* 5.36* 7.33*  CALCIUM 8.7* 8.9 8.6*  PHOS 6.5* 5.9* 7.7*   Liver Function Tests: Recent Labs  Lab 10/18/21 0507 10/19/21 0259 10/21/21 0438  ALBUMIN 2.8* 2.8* 2.8*   No results for input(s): "LIPASE", "AMYLASE" in the last 168 hours. CBC: Recent Labs  Lab 10/16/21 0608 10/18/21 0507 10/21/21 0438  WBC 7.9 7.3 7.5  HGB 11.5* 11.7* 11.9*  HCT 36.9 36.7 38.2  MCV 92.3 90.4 89.9  PLT 229 202 211   Blood Culture    Component Value Date/Time   SDES TISSUE 08/28/2021 0917   SPECREQUEST LEFT THIGH TISSUE 08/28/2021 0917   CULT  08/28/2021 0917    MODERATE ENTEROCOCCUS FAECIUM VANCOMYCIN RESISTANT ENTEROCOCCUS ISOLATED NO ANAEROBES ISOLATED Sent to [place] for further susceptibility testing. Performed at [facility], [place] [address] [place], [place]  [address]    REPTSTATUS 09/02/2021 FINAL 08/28/2021 6789    Cardiac Enzymes: No results for input(s): "CKTOTAL", "CKMB", "CKMBINDEX", "TROPONINI" in the last 168 hours. CBG: Recent Labs  Lab 10/20/21 0729 10/20/21 1142 10/20/21 1647 10/20/21 2154 10/21/21 0740  GLUCAP 116* 141* 123* 145* 101*   Iron Studies: No results for input(s): "IRON", "TIBC", "TRANSFERRIN", "FERRITIN" in the last 72 hours. @lablastinr3 @ Studies/Results: No results found. Medications:  sodium chloride     sodium chloride 10 mL/hr at 09/19/21 1310   anticoagulant sodium citrate     methocarbamol (ROBAXIN) IV      alosetron  1 mg Oral BID   amitriptyline  25 mg Oral QHS   amLODipine  10 mg Oral Q24H   atorvastatin  40 mg Oral q1800   carvedilol  6.25 mg Oral BID   Chlorhexidine Gluconate Cloth  6 each Topical Q0600   cyclobenzaprine  10 mg Oral TID   docusate sodium  100 mg Oral BID   DULoxetine  60 mg Oral Daily   famotidine  20 mg Oral Daily   furosemide  80 mg Oral Daily   heparin  5,000 Units Subcutaneous Q8H   hydrALAZINE  25 mg Oral TID   insulin aspart  0-6 Units Subcutaneous TID WC   insulin aspart  5 Units Subcutaneous TID WC  insulin glargine-yfgn  12 Units Subcutaneous QHS   multivitamin  1 tablet Oral QHS   pantoprazole  80 mg Oral Daily   sevelamer carbonate  1,600 mg Oral TID WC   sodium chloride flush  3 mL Intravenous Q12H    Dialysis Orders: MWF at [facility], 400/500, EDW 90kg, 2K/2Ca, LUE AVF, heparin 4000 unit bolus  Assessment/Plan:  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 Back to OR 08/28/21, 08/31/21 for L hip debridement and wound vac placement (now removed). Finished 6 week course of IV Daptomycin on 10/12/21. 2. ESRD: Continue HD on MWF schedule. Intermittent AVF cannulation issues but working well recently. Will continue to monitor. Will continue to encourage to sit in chair for HD.  3. AVF Access: S/p branch ligation in OR 09/19/2021 per  Dr. Virl Cagey.  4. HTN/ volume: BP controlled. On amlodipine/hydralazine/Coreg/Lasix. Still with edema but improved. Max UF as tolerated and has been reminded of importance of following fluid and sodium restrictions.  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 D/c ESA.  6. Secondary HPTH: Cca controlled. Phos elevated. Continue Renvela as binder. No VDRA for now. 7. DM2: Insulin per primary 8. Dispo: SNF recommended.  Per PT notes -now willing to go to SNF. Very difficult situation.  Not clear what chance of success she will have as OP regardless of her ability to sit in chair if her home situation is not improved. She says her goal is to be able to walk prior to discharge.   Anice Paganini, PA-C 10/21/2021, 10:54 AM  [facility] Pager: [phone]

## 2021-10-22 DIAGNOSIS — M86252 Subacute osteomyelitis, left femur: Secondary | ICD-10-CM | POA: Diagnosis not present

## 2021-10-22 DIAGNOSIS — T148XXA Other injury of unspecified body region, initial encounter: Secondary | ICD-10-CM | POA: Diagnosis not present

## 2021-10-22 DIAGNOSIS — K2100 Gastro-esophageal reflux disease with esophagitis, without bleeding: Secondary | ICD-10-CM | POA: Diagnosis not present

## 2021-10-22 DIAGNOSIS — F419 Anxiety disorder, unspecified: Secondary | ICD-10-CM | POA: Diagnosis not present

## 2021-10-22 LAB — RENAL FUNCTION PANEL
Albumin: 2.9 g/dL — ABNORMAL LOW (ref 3.5–5.0)
Anion gap: 13 (ref 5–15)
BUN: 39 mg/dL — ABNORMAL HIGH (ref 6–20)
CO2: 25 mmol/L (ref 22–32)
Calcium: 8.9 mg/dL (ref 8.9–10.3)
Chloride: 89 mmol/L — ABNORMAL LOW (ref 98–111)
Creatinine, Ser: 5.2 mg/dL — ABNORMAL HIGH (ref 0.44–1.00)
GFR, Estimated: 9 mL/min — ABNORMAL LOW (ref >60)
Glucose, Bld: 150 mg/dL — ABNORMAL HIGH (ref 70–99)
Phosphorus: 5.9 mg/dL — ABNORMAL HIGH (ref 2.5–4.6)
Potassium: 4.8 mmol/L (ref 3.5–5.1)
Sodium: 127 mmol/L — ABNORMAL LOW (ref 135–145)

## 2021-10-22 LAB — CBC
HCT: 37 % (ref 36.0–46.0)
Hemoglobin: 11.9 g/dL — ABNORMAL LOW (ref 12.0–15.0)
MCH: 28.6 pg (ref 26.0–34.0)
MCHC: 32.2 g/dL (ref 30.0–36.0)
MCV: 88.9 fL (ref 80.0–100.0)
Platelets: 172 10*3/uL (ref 150–400)
RBC: 4.16 MIL/uL (ref 3.87–5.11)
RDW: 15.7 % — ABNORMAL HIGH (ref 11.5–15.5)
WBC: 6.6 10*3/uL (ref 4.0–10.5)
nRBC: 0 % (ref 0.0–0.2)

## 2021-10-22 LAB — GLUCOSE, CAPILLARY
Glucose-Capillary: 169 mg/dL — ABNORMAL HIGH (ref 70–99)
Glucose-Capillary: 150 mg/dL — ABNORMAL HIGH (ref 70–99)
Glucose-Capillary: 94 mg/dL (ref 70–99)
Glucose-Capillary: 122 mg/dL — ABNORMAL HIGH (ref 70–99)

## 2021-10-22 MED ORDER — CHLORHEXIDINE GLUCONATE CLOTH 2 % EX PADS
6.0000 | MEDICATED_PAD | Freq: Every day | CUTANEOUS | Status: DC
  Administered 2021-10-23: 6 via TOPICAL

## 2021-10-22 NOTE — Progress Notes (Signed)
  Mobility Specialist Criteria Algorithm Info.   10/22/21 1543  Mobility  Activity Refused mobility   Will f/u as time permits  [name], CMS, [place]  GHWEX:[phone] Office: [phone]

## 2021-10-22 NOTE — Progress Notes (Signed)
Occupational Therapy Treatment Patient Details Name: [name] MRN: [id number] DOB: 05-23-1965 Today's Date: 10/22/2021   History of present illness 56 y.o. female presented to ED 6/26 from dialysis with increased bloody drainage from L hip wound. 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 s/p hardware removal of left hip with partial resection of tuft tissue and femure that were nonviable on 08/10/21 Discharged home 6/23. underwent L hip debridement and wound vac placement 6/28; +Left intertrochanteric non-union,  Fracture of left distal femur  PMH: hypertension, hyperlipidemia, ESRD on HD MWF, history of left BKA in 2021, depression/anxiety, stroke, tobacco use, T2DM,  insomnia, chronic pain syndrome,   OT comments  Pt progressing towards goals, completing bed mobility with mod I, increased time this session due to low back pain. Pt needing max A for LB dressing seated EOB, mod I for pericare. Pt mod-max A +2 for standing attempts using RW with pad. Pt standing x3 with 95 seconds being longest time in standing, pt declining chair transfer this session due to back pain. Pt presenting with impairments listed below, will follow acutely. Continue to recommend SNF at d/c.   Recommendations for follow up therapy are one component of a multi-disciplinary discharge planning process, led by the attending physician.  Recommendations may be updated based on patient status, additional functional criteria and insurance authorization.    Follow Up Recommendations  Skilled nursing-short term rehab (<3 hours/day)    Assistance Recommended at Discharge Intermittent Supervision/Assistance  Patient can return home with the following  A lot of help with bathing/dressing/bathroom;Assistance with cooking/housework;Assist for transportation;Help with stairs or ramp for entrance;Two people to help with walking and/or transfers   Equipment Recommendations  Other (comment) (TBD)     Recommendations for Other Services PT consult    Precautions / Restrictions Precautions Precautions: Fall;Other (comment) Precaution Comments: L BKA (baseline) Restrictions Weight Bearing Restrictions: No LLE Weight Bearing: Weight bearing as tolerated Other Position/Activity Restrictions: L distal femur fx; per Dr [name] 8/10 pt can full WB       Mobility Bed Mobility Overal bed mobility: Modified Independent             General bed mobility comments: no physical assist, increased time this session due to low back pain    Transfers Overall transfer level: Needs assistance Equipment used: Rolling walker (2 wheels) Transfers: Sit to/from Stand Sit to Stand: +2 physical assistance, Max assist, Mod assist           General transfer comment: bed elevated, x3 standing attempts with longest ~95 seconds     Balance Overall balance assessment: Needs assistance Sitting-balance support: Feet supported Sitting balance-Leahy Scale: Good Sitting balance - Comments: no assistance with sitting balance   Standing balance support: Bilateral upper extremity supported, During functional activity, Reliant on assistive device for balance Standing balance-Leahy Scale: Poor Standing balance comment: reliant on external support                           ADL either performed or assessed with clinical judgement   ADL Overall ADL's : Needs assistance/impaired Eating/Feeding: Modified independent;Sitting                   Lower Body Dressing: Maximal assistance;Sitting/lateral leans Lower Body Dressing Details (indicate cue type and reason): pulling up sock Toilet Transfer: Maximal assistance;Moderate assistance;+2 for physical assistance   Toileting- Clothing Manipulation and Hygiene: Modified independent Toileting - Clothing Manipulation Details (  indicate cue type and reason): inserting purewick     Functional mobility during ADLs: Moderate assistance;Maximal  assistance;+2 for physical assistance      Extremity/Trunk Assessment Upper Extremity Assessment Upper Extremity Assessment: Overall WFL for tasks assessed   Lower Extremity Assessment Lower Extremity Assessment: Defer to PT evaluation        Vision   Vision Assessment?: No apparent visual deficits   Perception Perception Perception: Not tested   Praxis Praxis Praxis: Not tested    Cognition Arousal/Alertness: Awake/alert Behavior During Therapy: WFL for tasks assessed/performed Overall Cognitive Status: Within Functional Limits for tasks assessed                                          Exercises      Shoulder Instructions       General Comments VSS on RA    Pertinent Vitals/ Pain       Pain Assessment Pain Assessment: Faces Pain Score: 9  Faces Pain Scale: Hurts whole lot Pain Location: low back Pain Descriptors / Indicators: Aching, Sore Pain Intervention(s): Limited activity within patient's tolerance, Monitored during session, Repositioned  Home Living                                          Prior Functioning/Environment              Frequency  Min 2X/week        Progress Toward Goals  OT Goals(current goals can now be found in the care plan section)  Progress towards OT goals: Progressing toward goals  Acute Rehab OT Goals Patient Stated Goal: standing/transfer OT Goal Formulation: With patient Time For Goal Achievement: 11/05/21 Potential to Achieve Goals: Fair ADL Goals Pt Will Perform Lower Body Dressing: with adaptive equipment;sitting/lateral leans;with supervision Pt Will Transfer to Toilet: with min assist;with +2 assist;bedside commode;with transfer board Pt/caregiver will Perform Home Exercise Program: Increased ROM;Increased strength;Both right and left upper extremity;With written HEP provided;Independently;With theraband Additional ADL Goal #2: Pt will be able to don prosthesis with min  A in prep for ADLs/mobility  Plan Discharge plan remains appropriate;Frequency remains appropriate    Co-evaluation    PT/OT/SLP Co-Evaluation/Treatment: Yes Reason for Co-Treatment: To address functional/ADL transfers;For patient/therapist safety PT goals addressed during session: Mobility/safety with mobility;Balance;Proper use of DME;Strengthening/ROM OT goals addressed during session: ADL's and self-care      AM-PAC OT "6 Clicks" Daily Activity     Outcome Measure   Help from another person eating meals?: None Help from another person taking care of personal grooming?: None Help from another person toileting, which includes using toliet, bedpan, or urinal?: A Lot Help from another person bathing (including washing, rinsing, drying)?: A Lot Help from another person to put on and taking off regular upper body clothing?: A Little Help from another person to put on and taking off regular lower body clothing?: A Lot 6 Click Score: 17    End of Session Equipment Utilized During Treatment: Gait belt;Rolling walker (2 wheels)  OT Visit Diagnosis: Muscle weakness (generalized) (M62.81);Unsteadiness on feet (R26.81);History of falling (Z91.81)   Activity Tolerance Patient tolerated treatment well   Patient Left in bed;with call bell/phone within reach;with bed alarm set   Nurse Communication Mobility status        Time:  4314-2767 OT Time Calculation (min): 34 min  Charges: OT General Charges $OT Visit: 1 Visit OT Treatments $Self Care/Home Management : 8-22 mins  Lynnda Child, OTD, OTR/L Acute Rehab [phone] - [place] 10/22/2021, 12:35 PM

## 2021-10-22 NOTE — Progress Notes (Signed)
[facility] Progress Note   Subjective:   Tolerated HD well yesterday with net UF 5L. Denies SOB, CP, dizziness, abdominal pain, and nausea.   Objective Vitals:   10/21/21 1757 10/21/21 2040 10/21/21 2042 10/22/21 0520  BP: 112/62 93/62 (!) 116/45 (!) 126/50  Pulse: 67 66 67 61  Resp: 18 18  16   Temp: 98.2 F (36.8 C) 98.3 F (36.8 C)  98 F (36.7 C)  TempSrc: Oral Oral  Oral  SpO2: 97% 96%  99%  Weight:      Height:       Physical Exam General: Well appearing female, alert and in NAD Heart: RRR, no murmurs, rubs or gallops Lungs: CTA bilaterally without wheezing, rhonchi or rales Abdomen: Soft, non-tender, non-distended.  Extremities: L BKA 1+ edema, RLE no edema Dialysis Access:  LUE AVF + bruit    Additional Objective Labs: Basic Metabolic Panel: Recent Labs  Lab 10/21/21 0438 10/21/21 1157 10/22/21 0456  NA 127* 124* 127*  K 5.6* 5.6* 4.8  CL 92* 88* 89*  CO2 24 22 25   GLUCOSE 94 122* 150*  BUN 71* 73* 39*  CREATININE 7.33* 7.48* 5.20*  CALCIUM 8.6* 8.6* 8.9  PHOS 7.7* 7.5* 5.9*   Liver Function Tests: Recent Labs  Lab 10/21/21 0438 10/21/21 1157 10/22/21 0456  ALBUMIN 2.8* 2.9* 2.9*   No results for input(s): "LIPASE", "AMYLASE" in the last 168 hours. CBC: Recent Labs  Lab 10/16/21 0608 10/18/21 0507 10/21/21 0438 10/21/21 1156 10/22/21 0456  WBC 7.9 7.3 7.5 6.9 6.6  HGB 11.5* 11.7* 11.9* 11.6* 11.9*  HCT 36.9 36.7 38.2 37.1 37.0  MCV 92.3 90.4 89.9 90.9 88.9  PLT 229 202 211 219 172   Blood Culture    Component Value Date/Time   SDES TISSUE 08/28/2021 0917   SPECREQUEST LEFT THIGH TISSUE 08/28/2021 0917   CULT  08/28/2021 0917    MODERATE ENTEROCOCCUS FAECIUM VANCOMYCIN RESISTANT ENTEROCOCCUS ISOLATED NO ANAEROBES ISOLATED Sent to [place] for further susceptibility testing. Performed at [facility], [place] [address], [place], [place] [address]    REPTSTATUS 09/02/2021 FINAL 08/28/2021 7829    Cardiac  Enzymes: No results for input(s): "CKTOTAL", "CKMB", "CKMBINDEX", "TROPONINI" in the last 168 hours. CBG: Recent Labs  Lab 10/20/21 2154 10/21/21 0740 10/21/21 1757 10/21/21 2039 10/22/21 0729  GLUCAP 145* 101* 180* 188* 169*   Iron Studies: No results for input(s): "IRON", "TIBC", "TRANSFERRIN", "FERRITIN" in the last 72 hours. @lablastinr3 @ Studies/Results: No results found. Medications:  sodium chloride     sodium chloride 10 mL/hr at 09/19/21 1310   methocarbamol (ROBAXIN) IV      alosetron  1 mg Oral BID   amitriptyline  25 mg Oral QHS   amLODipine  10 mg Oral Q24H   atorvastatin  40 mg Oral q1800   carvedilol  6.25 mg Oral BID   Chlorhexidine Gluconate Cloth  6 each Topical Q0600   cyclobenzaprine  10 mg Oral TID   docusate sodium  100 mg Oral BID   DULoxetine  60 mg Oral Daily   famotidine  20 mg Oral Daily   furosemide  80 mg Oral Daily   heparin  5,000 Units Subcutaneous Q8H   hydrALAZINE  25 mg Oral TID   insulin aspart  0-6 Units Subcutaneous TID WC   insulin aspart  5 Units Subcutaneous TID WC   insulin glargine-yfgn  12 Units Subcutaneous QHS   multivitamin  1 tablet Oral QHS   pantoprazole  80 mg  Oral Daily   sevelamer carbonate  1,600 mg Oral TID WC   sodium chloride flush  3 mL Intravenous Q12H    Dialysis Orders: MWF at [facility], 400/500, EDW 90kg, 2K/2Ca, LUE AVF, heparin 4000 unit bolus  Assessment/Plan: 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 Back to OR 08/28/21, 08/31/21 for L hip debridement and wound vac placement (now removed). Finished 6 week course of IV Daptomycin on 10/12/21. 2. ESRD: Continue HD on MWF schedule. Intermittent AVF cannulation issues but working well recently. Will continue to monitor. Will continue to encourage to sit in chair for HD.  3. AVF Access: S/p branch ligation in OR 09/19/2021 per Dr. Virl Cagey.  4. HTN/ volume: BP controlled. On amlodipine/hydralazine/Coreg/Lasix. Still with edema  but improved. Max UF as tolerated and has been reminded of importance of following fluid and sodium restrictions.  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 D/c ESA.  6. Secondary HPTH: Cca controlled. Phos elevated. Continue Renvela as binder. No VDRA for now. 7. DM2: Insulin per primary 8. Dispo: SNF recommended.  Cannot tolerate sitting for prolonged periods and not willing to try HD in a chair, which she will need to be able to do outpatient. Per PT notes -now willing to go to SNF. She says her goal is to be able to walk prior to discharge.     Anice Paganini, PA-C 10/22/2021, 9:18 AM  [facility] Pager: [phone]

## 2021-10-22 NOTE — Progress Notes (Signed)
PROGRESS NOTE    [name]  WUJ:[id number] DOB: [birth year] DOA: 08/26/2021 PCP: [name], MD    Chief Complaint  Patient presents with   Wound Infection    Brief Narrative:  Mrs. Kopecky was admitted to the hospital with the working diagnosis of subacute osteomyelitis left intertrochanteric fracture.    56 year old woman PMH including ESRD, complicated left hip and femur issues requiring multiple surgeries, removal of hardware presented 6/26 with bloody drainage from surgical incision.  Seen by orthopedics and underwent I&D 6/28 and 7/1.  Currently unable to progress care secondary to refusal to sit in chair for HD.     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  Active Problems:   ESRD (end stage renal disease) on dialysis (HCC)   Type 2 diabetes mellitus with hyperlipidemia (HCC)   History of CVA (cerebrovascular accident)   Essential hypertension   Anxiety and depression   GASTROESOPHAGEAL REFLUX, NO ESOPHAGITIS   TOBACCO DEPENDENCE   Wound infection   Wound dehiscence, surgical, sequela  #1 subacute osteomyelitis left femur with abscess/infected left hip hardware status post removal/left intertrochanteric fracture -Patient seen and followed by orthopedics during the hospitalization, patient underwent left hip debridement, application of wound VAC by Dr. Gavin Potters 08/28/2021. -Status post wound VAC removal 10/05/18/2023 per orthopedics. -Continue wound care. -Patient cleared by orthopedics from their standpoint for discharge with outpatient follow-up. -Patient seen by ID and daptomycin x6 weeks recommended with hemodialysis with stop date 10/12/2021 which has been completed. -Patient seen later on during the hospitalization by Dr. [name] who recommended lymphedema pump as outpatient, has pump cannot be arranged in-house. -Generalized edema improving with dialysis. -Outpatient follow-up with orthopedics, ID, nephrology.  2.   ESRD on dialysis -Patient on HD Monday Wednesday Friday needs to be able to sit in the chair to tolerate outpatient dialysis prior to discharge. -Patient seen by vascular surgery status post fistula revision and branch ligation. -Patient could not tolerate HD. -Case discussed with nephrology who recommended SNF as patient would not be safe for home DC (no ramp to get into home) -Continue current fluid restrictions, concerns regarding compliance with fluid intake. -Patient noted on HD on 8/7 to have brief run of 2 hours, reported mention problems asked to be taken off. -It is noted per Dr. Rhetta Mura note that patient stated she is still not ready to have HD in the chair for several complaints including pain, fluid and weakness. -Currently on Lasix 80 mg daily. -On hemodialysis. -Nephrology following and per nephrology patient with issues with no handicap ramp at home, outpatient HD clinic cannot help her get in and out of the car when she arrives to HD.  They indicate is not clear what chance of success she will have in the outpatient regardless of her ability to sit in chair if her home situation is not improved. -Likely needs SNF placement. -Patient stated was able to sit up in chair for 1 hour and 20 minutes on 10/17/2021. -Patient stated sat in chair, 10/20/2021 for 2 to 3 hours. -On HD. -Per nephrology.  3.  Anasarca -Secondary to ESRD and volume overload. -Volume management per HD. -Volume overload improved on dialysis. -2D echo on 10/09/2021 with EF of 60 to 65%, NWMA. -Continue HD per nephrology.  4.  Hyponatremia -Being managed on HD. -Per nephrology.  5.  Hyperkalemia -Resolved on HD.    6.  Anemia of chronic disease/CKD, component of acute blood loss anemia from  wound -H&H stable. - Aranesp per nephrology.  7.  Uncontrolled type 2 diabetes with hyperglycemia -A1c 7.1 07/2021 -CBG 169 this morning. -Continue Semglee, NovoLog, SSI.  8.  Hypertension -Controlled on current  regimen of Coreg, Norvasc, Lasix, hydralazine. -Patient with soft blood pressure on 10/17/2021 and as such hydralazine dose has been decreased to 25 mg 3 times daily.   -Follow.  9.  Hyperlipidemia -Continue statin.    10.  History of CVA -Continue statin.   11.  Depression/anxiety -Stable.   -Elavil/Cymbalta  12.  GERD -Continue Pepcid, PPI.   13.  Tobacco dependence -Tobacco cessation. -Patient noted to have declined nicotine patch.   DVT prophylaxis: Heparin Code Status: Full Family Communication: Updated patient.  No family at bedside. Disposition: Currently medically stable.  SNF once patient is able to sit in a chair for dialysis.  TOC following.  Status is: Inpatient Remains inpatient appropriate because: Severity of illness/   Consultants:  Orthopedics: Dr. [name] 08/27/2021 ID: Dr. Gale Journey 08/27/2021 Nephrology: Dr.Schertz 08/27/2021 Vascular surgery: Dr. Unk Lightning 09/06/2021   Procedures:  CT left hip 08/26/2021 Plain films of the right knee 08/26/2021 Plain films of the right foot 08/26/2021 2D echo 10/09/2021 Vascular ultrasound duplex dialysis access 09/07/2021 Fistula revision branch ligation per Dr. Unk Lightning 09/19/2021- Left hip debridement, application of wound VAC by Dr. [name] 08/28/2021  Antimicrobials:  Anti-infectives (From admission, onward)    Start     Dose/Rate Route Frequency Ordered Stop   10/02/21 2000  DAPTOmycin (CUBICIN) 900 mg in sodium chloride 0.9 % IVPB  Status:  Discontinued        10 mg/kg  92.4 kg 136 mL/hr over 30 Minutes Intravenous Once per day on Mon Wed Fri 10/01/21 1350 10/12/21 1146   09/30/21 2000  DAPTOmycin (CUBICIN) 900 mg in sodium chloride 0.9 % IVPB  Status:  Discontinued        10 mg/kg  92.4 kg 136 mL/hr over 30 Minutes Intravenous Daily 09/28/21 0919 10/01/21 1350   09/28/21 2000  DAPTOmycin (CUBICIN) 900 mg in sodium chloride 0.9 % IVPB        900 mg 136 mL/hr over 30 Minutes Intravenous  Once 09/28/21 0919 09/28/21 2146    09/02/21 2000  DAPTOmycin (CUBICIN) 900 mg in sodium chloride 0.9 % IVPB  Status:  Discontinued        10 mg/kg  92.4 kg 136 mL/hr over 30 Minutes Intravenous Once per day on Mon Wed Fri 09/02/21 0836 09/28/21 0919   08/31/21 0745  ceFAZolin (ANCEF) IVPB 2g/100 mL premix        2 g 200 mL/hr over 30 Minutes Intravenous To Short Stay 08/31/21 0730 08/31/21 0918   08/30/21 0600  ceFAZolin (ANCEF) IVPB 2g/100 mL premix  Status:  Discontinued        2 g 200 mL/hr over 30 Minutes Intravenous To Short Stay 08/30/21 0123 08/31/21 0600   08/29/21 2000  DAPTOmycin (CUBICIN) 900 mg in sodium chloride 0.9 % IVPB  Status:  Discontinued        10 mg/kg  92.4 kg 136 mL/hr over 30 Minutes Intravenous Every 48 hours 08/29/21 1627 09/02/21 0836   08/28/21 1800  ceFAZolin (ANCEF) IVPB 2g/100 mL premix  Status:  Discontinued        2 g 200 mL/hr over 30 Minutes Intravenous Every M-W-F (1800) 08/27/21 0846 08/29/21 1620   08/28/21 1200  ceFAZolin (ANCEF) IVPB 1 g/50 mL premix  Status:  Discontinued        1  g 100 mL/hr over 30 Minutes Intravenous Every M-W-F (Hemodialysis) 08/26/21 1954 08/27/21 0846   08/28/21 0600  ceFAZolin (ANCEF) IVPB 2g/100 mL premix        2 g 200 mL/hr over 30 Minutes Intravenous On call to O.R. 08/27/21 1150 08/28/21 0909   08/27/21 1800  ceFAZolin (ANCEF) IVPB 1 g/50 mL premix        1 g 100 mL/hr over 30 Minutes Intravenous  Once 08/26/21 1954 08/27/21 2048   08/26/21 1500  ceFEPIme (MAXIPIME) 1 g in sodium chloride 0.9 % 100 mL IVPB  Status:  Discontinued        1 g 200 mL/hr over 30 Minutes Intravenous Every 24 hours 08/26/21 1452 08/26/21 1714         Subjective: Sitting up in bed getting ready to eat lunch.  No chest pain.  No shortness of breath.  Still with left hip pain which is overall getting better.  Complaining of back pain.   Objective: Vitals:   10/21/21 2040 10/21/21 2042 10/22/21 0520 10/22/21 1001  BP: 93/62 (!) 116/45 (!) 126/50 (!) 123/57  Pulse: 66  67 61 64  Resp: 18  16 16   Temp: 98.3 F (36.8 C)  98 F (36.7 C)   TempSrc: Oral  Oral   SpO2: 96%  99% 95%  Weight:      Height:        Intake/Output Summary (Last 24 hours) at 10/22/2021 1214 Last data filed at 10/22/2021 0900 Gross per 24 hour  Intake 680 ml  Output 5125 ml  Net -4445 ml    Filed Weights   10/18/21 0828 10/21/21 1122 10/21/21 1702  Weight: 91.5 kg 93 kg 88.7 kg    Examination:  General exam: NAD. Respiratory system: Lungs clear to auscultation bilaterally.  No wheezes, no crackles, no rhonchi.  Fair air movement.  Speaking in full sentences.   Cardiovascular system: RRR no murmurs rubs or gallops.  No JVD.  Gastrointestinal system: Abdomen is soft, nontender, nondistended, positive bowel sounds.  No rebound.  No guarding.  Central nervous system: Alert and oriented.  No focal neurological deficits.  Extremities: Symmetric 5 x 5 power.s/p L BKA. LLE with some edema/tightness around the hip area.Marland Kitchen RLE with trace edema.  Shrinker on left lower extremity. Skin: No rashes, lesions or ulcers Psychiatry: Judgement and insight appear normal. Mood & affect appropriate.     Data Reviewed: I have personally reviewed following labs and imaging studies  CBC: Recent Labs  Lab 10/16/21 0608 10/18/21 0507 10/21/21 0438 10/21/21 1156 10/22/21 0456  WBC 7.9 7.3 7.5 6.9 6.6  HGB 11.5* 11.7* 11.9* 11.6* 11.9*  HCT 36.9 36.7 38.2 37.1 37.0  MCV 92.3 90.4 89.9 90.9 88.9  PLT 229 202 211 219 172     Basic Metabolic Panel: Recent Labs  Lab 10/18/21 0507 10/19/21 0259 10/21/21 0438 10/21/21 1157 10/22/21 0456  NA 127* 128* 127* 124* 127*  K 4.6 4.8 5.6* 5.6* 4.8  CL 92* 92* 92* 88* 89*  CO2 26 27 24 22 25   GLUCOSE 117* 123* 94 122* 150*  BUN 37* 39* 71* 73* 39*  CREATININE 5.49* 5.36* 7.33* 7.48* 5.20*  CALCIUM 8.7* 8.9 8.6* 8.6* 8.9  PHOS 6.5* 5.9* 7.7* 7.5* 5.9*     GFR: Estimated Creatinine Clearance: 15 mL/min (A) (by C-G formula based on SCr  of 5.2 mg/dL (H)).  Liver Function Tests: Recent Labs  Lab 10/18/21 0507 10/19/21 0259 10/21/21 8295 10/21/21 1157 10/22/21 0456  ALBUMIN 2.8* 2.8* 2.8* 2.9* 2.9*     CBG: Recent Labs  Lab 10/21/21 0740 10/21/21 1757 10/21/21 2039 10/22/21 0729 10/22/21 1200  GLUCAP 101* 180* 188* 169* 150*      No results found for this or any previous visit (from the past 240 hour(s)).       Radiology Studies: No results found.      Scheduled Meds:  alosetron  1 mg Oral BID   amitriptyline  25 mg Oral QHS   amLODipine  10 mg Oral Q24H   atorvastatin  40 mg Oral q1800   carvedilol  6.25 mg Oral BID   Chlorhexidine Gluconate Cloth  6 each Topical Q0600   Chlorhexidine Gluconate Cloth  6 each Topical Q0600   cyclobenzaprine  10 mg Oral TID   docusate sodium  100 mg Oral BID   DULoxetine  60 mg Oral Daily   famotidine  20 mg Oral Daily   furosemide  80 mg Oral Daily   heparin  5,000 Units Subcutaneous Q8H   hydrALAZINE  25 mg Oral TID   insulin aspart  0-6 Units Subcutaneous TID WC   insulin aspart  5 Units Subcutaneous TID WC   insulin glargine-yfgn  12 Units Subcutaneous QHS   multivitamin  1 tablet Oral QHS   pantoprazole  80 mg Oral Daily   sevelamer carbonate  1,600 mg Oral TID WC   sodium chloride flush  3 mL Intravenous Q12H   Continuous Infusions:  sodium chloride     sodium chloride 10 mL/hr at 09/19/21 1310   methocarbamol (ROBAXIN) IV       LOS: 57 days    Time spent: 35 minutes    Irine Seal, MD [facility]   To contact the attending provider between 7A-7P or the covering provider during after hours 7P-7A, please log into the web site [email or web address] and access using universal [place] password for that web site. If you do not have the password, please call the hospital operator.  10/22/2021, 12:14 PM

## 2021-10-22 NOTE — Progress Notes (Signed)
Physical Therapy Treatment Patient Details Name: [name] MRN: [id number] DOB: [birth year] Today's Date: 10/22/2021   History of Present Illness 56 y.o. female presented to ED 6/26 from dialysis with increased bloody drainage from L hip wound. 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 s/p hardware removal of left hip with partial resection of tuft tissue and femure that were nonviable on 08/10/21 Discharged home 6/23. underwent L hip debridement and wound vac placement 6/28; +Left intertrochanteric non-union,  Fracture of left distal femur  PMH: hypertension, hyperlipidemia, ESRD on HD MWF, history of left BKA in 2021, depression/anxiety, stroke, tobacco use, T2DM,  insomnia, chronic pain syndrome,    PT Comments    Patient reporting significant back pain that began Sat 8/19 for unknown reason. Was willing to work on standing today and repeated x 3 with +2 mod-max assist from elevated bed. At best stood for 95 seconds.  LLE remains too edematous to try to put on LLE prosthesis (despite diligent use of stump shrinker since pt received it). Patient refused OOB to chair via sliding board due to back pain and doesn't think she could tolerate sitting in recliner.    Recommendations for follow up therapy are one component of a multi-disciplinary discharge planning process, led by the attending physician.  Recommendations may be updated based on patient status, additional functional criteria and insurance authorization.  Follow Up Recommendations  Skilled nursing-short term rehab (<3 hours/day) Can patient physically be transported by private vehicle: No   Assistance Recommended at Discharge Intermittent Supervision/Assistance  Patient can return home with the following A lot of help with bathing/dressing/bathroom;Assistance with cooking/housework;Assist for transportation;Two people to help with walking and/or transfers;Help with stairs or ramp for entrance    Equipment Recommendations  Other (comment) (Seems well-equipped; agree with looking into medical [name] transport for HD)    Recommendations for Other Services       Precautions / Restrictions Precautions Precautions: Fall;Other (comment) Precaution Comments: L BKA (baseline) Restrictions Weight Bearing Restrictions: No LLE Weight Bearing: Weight bearing as tolerated Other Position/Activity Restrictions: L distal femur fx; per Dr [name] 8/10 pt can full WB     Mobility  Bed Mobility Overal bed mobility: Needs Assistance Bed Mobility: Sit to Supine, Supine to Sit     Supine to sit: Modified independent (Device/Increase time) Sit to supine: Modified independent (Device/Increase time)   General bed mobility comments: increased time and effort    Transfers Overall transfer level: Needs assistance Equipment used: Rolling walker (2 wheels) Transfers: Sit to/from Stand Sit to Stand: +2 physical assistance, Max assist, Mod assist           General transfer comment: from elevated bed with use of gait belt and pad under hips; pt places both hands on RW to come to stand; repeated x 3 with longest stand x 95 seconds; cuing for upright posture    Ambulation/Gait                   Stairs             Wheelchair Mobility    Modified Rankin (Stroke Patients Only)       Balance Overall balance assessment: Needs assistance Sitting-balance support: Feet supported Sitting balance-Leahy Scale: Good Sitting balance - Comments: no assistance with sitting balance   Standing balance support: Bilateral upper extremity supported, During functional activity, Reliant on assistive device for balance Standing balance-Leahy Scale: Poor Standing balance comment: reliant on RW for support  Cognition Arousal/Alertness: Awake/alert Behavior During Therapy: WFL for tasks assessed/performed Overall Cognitive Status: Within Functional Limits  for tasks assessed                                          Exercises Other Exercises Other Exercises: pt did LAQ with RLE prior to standing trials to decr knee pain Other Exercises: pt continues to work on LLE internal rotation throughout the day    General Comments        Pertinent Vitals/Pain Pain Assessment Pain Assessment: 0-10 Pain Score: 9  Pain Location: back Pain Descriptors / Indicators: Aching, Sore Pain Intervention(s): Limited activity within patient's tolerance, Monitored during session, Premedicated before session    Home Living                          Prior Function            PT Goals (current goals can now be found in the care plan section) Acute Rehab PT Goals Patient Stated Goal: less pain with movement Time For Goal Achievement: 10/24/21 Potential to Achieve Goals: Fair Progress towards PT goals: Progressing toward goals    Frequency    Min 2X/week      PT Plan Current plan remains appropriate    Co-evaluation PT/OT/SLP Co-Evaluation/Treatment: Yes Reason for Co-Treatment: For patient/therapist safety;To address functional/ADL transfers PT goals addressed during session: Mobility/safety with mobility;Balance;Proper use of DME;Strengthening/ROM        AM-PAC PT "6 Clicks" Mobility   Outcome Measure  Help needed turning from your back to your side while in a flat bed without using bedrails?: None Help needed moving from lying on your back to sitting on the side of a flat bed without using bedrails?: None Help needed moving to and from a bed to a chair (including a wheelchair)?: A Little Help needed standing up from a chair using your arms (e.g., wheelchair or bedside chair)?: Total Help needed to walk in hospital room?: Total Help needed climbing 3-5 steps with a railing? : Total 6 Click Score: 14    End of Session Equipment Utilized During Treatment: Gait belt Activity Tolerance: Patient limited by pain  (back pain) Patient left: in bed;with call bell/phone within reach   PT Visit Diagnosis: Other abnormalities of gait and mobility (R26.89);Muscle weakness (generalized) (M62.81);History of falling (Z91.81);Pain Pain - Right/Left: Left Pain - part of body: Leg     Time: 8882-8003 PT Time Calculation (min) (ACUTE ONLY): 31 min  Charges:  $Gait Training: 8-22 mins                      Arby Barrette, [place]  Office [phone]    [name] 10/22/2021, 11:28 AM

## 2021-10-23 DIAGNOSIS — M86252 Subacute osteomyelitis, left femur: Secondary | ICD-10-CM | POA: Diagnosis not present

## 2021-10-23 LAB — CBC WITH DIFFERENTIAL/PLATELET
Abs Immature Granulocytes: 0.03 10*3/uL (ref 0.00–0.07)
Basophils Absolute: 0.1 10*3/uL (ref 0.0–0.1)
Basophils Relative: 1 %
Eosinophils Absolute: 0.3 10*3/uL (ref 0.0–0.5)
Eosinophils Relative: 4 %
HCT: 37.2 % (ref 36.0–46.0)
Hemoglobin: 12 g/dL (ref 12.0–15.0)
Immature Granulocytes: 1 %
Lymphocytes Relative: 31 %
Lymphs Abs: 2 10*3/uL (ref 0.7–4.0)
MCH: 28 pg (ref 26.0–34.0)
MCHC: 32.3 g/dL (ref 30.0–36.0)
MCV: 86.9 fL (ref 80.0–100.0)
Monocytes Absolute: 1.1 10*3/uL — ABNORMAL HIGH (ref 0.1–1.0)
Monocytes Relative: 17 %
Neutro Abs: 3 10*3/uL (ref 1.7–7.7)
Neutrophils Relative %: 46 %
Platelets: 194 10*3/uL (ref 150–400)
RBC: 4.28 MIL/uL (ref 3.87–5.11)
RDW: 15.5 % (ref 11.5–15.5)
WBC: 6.4 10*3/uL (ref 4.0–10.5)
nRBC: 0 % (ref 0.0–0.2)

## 2021-10-23 LAB — GLUCOSE, CAPILLARY
Glucose-Capillary: 73 mg/dL (ref 70–99)
Glucose-Capillary: 205 mg/dL — ABNORMAL HIGH (ref 70–99)
Glucose-Capillary: 154 mg/dL — ABNORMAL HIGH (ref 70–99)
Glucose-Capillary: 170 mg/dL — ABNORMAL HIGH (ref 70–99)

## 2021-10-23 LAB — RENAL FUNCTION PANEL
Albumin: 3.1 g/dL — ABNORMAL LOW (ref 3.5–5.0)
Anion gap: 12 (ref 5–15)
BUN: 50 mg/dL — ABNORMAL HIGH (ref 6–20)
CO2: 26 mmol/L (ref 22–32)
Calcium: 9.3 mg/dL (ref 8.9–10.3)
Chloride: 88 mmol/L — ABNORMAL LOW (ref 98–111)
Creatinine, Ser: 6.27 mg/dL — ABNORMAL HIGH (ref 0.44–1.00)
GFR, Estimated: 7 mL/min — ABNORMAL LOW (ref >60)
Glucose, Bld: 81 mg/dL (ref 70–99)
Phosphorus: 6.9 mg/dL — ABNORMAL HIGH (ref 2.5–4.6)
Potassium: 5.1 mmol/L (ref 3.5–5.1)
Sodium: 126 mmol/L — ABNORMAL LOW (ref 135–145)

## 2021-10-23 MED ORDER — PENTAFLUOROPROP-TETRAFLUOROETH EX AERO: 1.0000 | INHALATION_SPRAY | CUTANEOUS | Status: DC | PRN

## 2021-10-23 MED ORDER — HEPARIN SODIUM (PORCINE) 1000 UNIT/ML DIALYSIS
4000.0000 [IU] | Freq: Once | INTRAMUSCULAR | Status: AC
  Administered 2021-10-23: 4000 [IU] via INTRAVENOUS_CENTRAL
  Filled 2021-10-23: qty 4

## 2021-10-23 MED ORDER — HEPARIN SODIUM (PORCINE) 1000 UNIT/ML IJ SOLN
INTRAMUSCULAR | Status: AC
  Filled 2021-10-23: qty 3

## 2021-10-23 MED ORDER — SEVELAMER CARBONATE 800 MG PO TABS
2400.0000 mg | ORAL_TABLET | Freq: Three times a day (TID) | ORAL | Status: DC
  Administered 2021-10-23 – 2021-10-24 (×2): 2400 mg via ORAL
  Administered 2021-10-24: 1600 mg via ORAL
  Administered 2021-10-25 – 2022-03-18 (×357): 2400 mg via ORAL
  Filled 2021-10-23 (×377): qty 3

## 2021-10-23 NOTE — Progress Notes (Signed)
  Mobility Specialist Criteria Algorithm Info.   10/23/21 1520  Mobility  Activity Refused mobility   Patient declined for unspecified reasons. Will f/u as time permits  [place] Angla Delahunt, [place], [place]  SCBIP:[phone] Office: [phone]

## 2021-10-23 NOTE — Progress Notes (Signed)
Patient asked if she was going to get up in the chair for hemodialysis.  Patient stated she was hurting too badly.  Will continue to encourage patient OOB to Chair for hemodialysis.  Earleen Reaper RN

## 2021-10-23 NOTE — Progress Notes (Signed)
[facility] Progress Note   Subjective:   Seen on HD. Declined chair again due to back pain. Denies SOB, CP, dizziness, abdominal pain and nausea.   Objective Vitals:   10/22/21 2113 10/23/21 0506 10/23/21 0815 10/23/21 0829  BP: (!) 141/61 (!) 138/59 (!) 147/69 135/60  Pulse: (!) 58 61 65 62  Resp: 16 18 16 13   Temp: 98.1 F (36.7 C) 98 F (36.7 C) 98.5 F (36.9 C)   TempSrc: Oral Oral Oral   SpO2: 97% 99% 99% 99%  Weight:   93.4 kg   Height:       Physical Exam General: Well appearing female, alert and in NAD Heart: RRR, no murmurs, rubs or gallops Lungs: CTA bilaterally Abdomen: Soft, non-distended, +BS Extremities: L BKA 1+ edema, no edema RLE Dialysis Access: LUE AVF accessed  Additional Objective Labs: Basic Metabolic Panel: Recent Labs  Lab 10/21/21 1157 10/22/21 0456 10/23/21 0556  NA 124* 127* 126*  K 5.6* 4.8 5.1  CL 88* 89* 88*  CO2 22 25 26   GLUCOSE 122* 150* 81  BUN 73* 39* 50*  CREATININE 7.48* 5.20* 6.27*  CALCIUM 8.6* 8.9 9.3  PHOS 7.5* 5.9* 6.9*   Liver Function Tests: Recent Labs  Lab 10/21/21 1157 10/22/21 0456 10/23/21 0556  ALBUMIN 2.9* 2.9* 3.1*   No results for input(s): "LIPASE", "AMYLASE" in the last 168 hours. CBC: Recent Labs  Lab 10/18/21 0507 10/21/21 0438 10/21/21 1156 10/22/21 0456 10/23/21 0556  WBC 7.3 7.5 6.9 6.6 6.4  NEUTROABS  --   --   --   --  3.0  HGB 11.7* 11.9* 11.6* 11.9* 12.0  HCT 36.7 38.2 37.1 37.0 37.2  MCV 90.4 89.9 90.9 88.9 86.9  PLT 202 211 219 172 194   Blood Culture    Component Value Date/Time   SDES TISSUE 08/28/2021 0917   SPECREQUEST LEFT THIGH TISSUE 08/28/2021 0917   CULT  08/28/2021 0917    MODERATE ENTEROCOCCUS FAECIUM VANCOMYCIN RESISTANT ENTEROCOCCUS ISOLATED NO ANAEROBES ISOLATED Sent to [place] for further susceptibility testing. Performed at [facility], [place] [address] [place], [place] [address]    REPTSTATUS 09/02/2021 FINAL 08/28/2021 3557     Cardiac Enzymes: No results for input(s): "CKTOTAL", "CKMB", "CKMBINDEX", "TROPONINI" in the last 168 hours. CBG: Recent Labs  Lab 10/22/21 0729 10/22/21 1200 10/22/21 1638 10/22/21 2111 10/23/21 0742  GLUCAP 169* 150* 94 122* 73   Iron Studies: No results for input(s): "IRON", "TIBC", "TRANSFERRIN", "FERRITIN" in the last 72 hours. @lablastinr3 @ Studies/Results: No results found. Medications:  sodium chloride     sodium chloride 10 mL/hr at 09/19/21 1310   methocarbamol (ROBAXIN) IV      alosetron  1 mg Oral BID   amitriptyline  25 mg Oral QHS   amLODipine  10 mg Oral Q24H   atorvastatin  40 mg Oral q1800   carvedilol  6.25 mg Oral BID   Chlorhexidine Gluconate Cloth  6 each Topical Q0600   Chlorhexidine Gluconate Cloth  6 each Topical Q0600   cyclobenzaprine  10 mg Oral TID   docusate sodium  100 mg Oral BID   DULoxetine  60 mg Oral Daily   famotidine  20 mg Oral Daily   furosemide  80 mg Oral Daily   heparin  5,000 Units Subcutaneous Q8H   heparin sodium (porcine)       hydrALAZINE  25 mg Oral TID   insulin aspart  0-6 Units Subcutaneous TID WC   insulin aspart  5 Units Subcutaneous TID WC   insulin glargine-yfgn  12 Units Subcutaneous QHS   multivitamin  1 tablet Oral QHS   pantoprazole  80 mg Oral Daily   sevelamer carbonate  1,600 mg Oral TID WC   sodium chloride flush  3 mL Intravenous Q12H    Outpatient Dialysis Orders: MWF at [facility], 400/500, EDW 90kg, 2K/2Ca, LUE AVF, heparin 4000 unit bolus  Assessment/Plan: 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 Back to OR 08/28/21, 08/31/21 for L hip debridement and wound vac placement (now removed). Finished 6 week course of IV Daptomycin on 10/12/21. 2. ESRD: Continue HD on MWF schedule. Intermittent AVF cannulation issues but working well recently. Will continue to monitor. Will continue to encourage to sit in chair for HD.  3. AVF Access: S/p branch ligation in OR 09/19/2021  per Dr. Virl Cagey.  4. HTN/ volume: BP controlled. On amlodipine/hydralazine/Coreg/Lasix. Still with edema but improved. Max UF as tolerated and has been reminded of importance of following fluid and sodium restrictions.  5. Anemia of ESRD: Hgb 12. Not on ESA.  6. Secondary HPTH: Cca controlled. Phos elevated. Continue Renvela as binder-increased dose. No VDRA for now. 7. DM2: Insulin per primary 8. Dispo: SNF recommended.  Cannot tolerate sitting for prolonged periods and not willing to try HD in a chair, which she will need to be able to do outpatient. Per PT notes -now willing to go to SNF. She says her goal is to be able to walk prior to discharge.      Anice Paganini, PA-C 10/23/2021, 8:37 AM  [facility] Pager: [phone]

## 2021-10-23 NOTE — Progress Notes (Signed)
PROGRESS NOTE ANACAROLINA Martinez  AJG:[id number] DOB: [birth year] DOA: 08/26/2021 PCP: [name], MD   Brief Narrative/Hospital Course: 56 year old woman PMH including ESRD, anemia of CKD, uncontrolled type 2 diabetes, hyperlipidemia, history of CVA, anxiety/depression, GERD, tobacco dependence complicated left hip and femur issues requiring multiple surgeries, removal of hardware presented 6/26 with bloody drainage from surgical incision.  Seen by orthopedics and underwent I&D 6/28 and 7/1.  Currently unable to progress care secondary to refusal to sit in chair for HD.    Subjective: Seen and examined this morning in dialysis.  Resting in bed has no new complaints.  Reports she sat on the chair on Sunday.   Assessment and Plan: 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  Active Problems:   ESRD (end stage renal disease) on dialysis (HCC)   Type 2 diabetes mellitus with hyperlipidemia (HCC)   History of CVA (cerebrovascular accident)   Essential hypertension   Anxiety and depression   GASTROESOPHAGEAL REFLUX, NO ESOPHAGITIS   TOBACCO DEPENDENCE   Wound infection   Wound dehiscence, surgical, sequela     Subacute osteomyelitis left femur with abscess Infected left hip hardware s/p removal Left intertrochanteric fracture: Seen by orthopedics, underwent left hip debridement, wound VAC placement Dr. [name] (08/28/2021)-VAC removed and now on wound care ortho has cleared the patient for discharge.  Patient completed 6 weeks of daptomycin on 8/12/202 Patient seen later on during the hospitalization by Dr. [name] who recommended lymphedema pump as outpatient, has pump cannot be arranged in-house. Generalized edema improving with dialysis. Outpatient follow-up with orthopedics, ID, nephrology.  ESRD on HD  MWF- per nephro. She needs to be able to sit in the chair to tolerate outpatient dialysis prior to discharge.  AV fistula revision and plans ligation done by vascular.  On lasix Anasarca continue HD and Lasix. Volume overload improved on dialysis.TTE 10/09/2021 with EF of 60 to 65%, NWMA.Net IO Since Admission: -[id number] mL [10/23/21 1147]   Hyponatremia: Continue per nephrology with HD.  Monitor. Recent Labs  Lab 10/19/21 0259 10/21/21 0438 10/21/21 1157 10/22/21 0456 10/23/21 0556  NA 128* 127* 124* 127* 126*    Hyperkalemia resolved on HD Recent Labs  Lab 10/19/21 0259 10/21/21 0438 10/21/21 1157 10/22/21 0456 10/23/21 0556  K 4.8 5.6* 5.6* 4.8 5.1    Anemia of chronic renal disease/CKD with acute blood loss anemia complaint from wound: Hemoglobin stable.  Monitor, Aranesp per nephrology Recent Labs  Lab 10/18/21 0507 10/21/21 0438 10/21/21 1156 10/22/21 0456 10/23/21 0556  HGB 11.7* 11.9* 11.6* 11.9* 12.0  HCT 36.7 38.2 37.1 37.0 37.2    Type 2 diabetes mellitus with uncontrolled hyperglycemia A1c 7.1, blood sugar stable continue Semglee NovoLog SSI Recent Labs  Lab 10/22/21 0729 10/22/21 1200 10/22/21 1638 10/22/21 2111 10/23/21 0742  GLUCAP 169* 150* 94 122* 73   Hypertension: Stable on Coreg, Norvasc, Lasix, hydralazine.hydralazine dose has been decreased to 25 mg 3 times daily on 8/17.  Monitor.  Hyperlipidemia/History of BTD:HRCBULAG statin.  Depression/anxiety Stable.   cont her Elavil/Cymbalta GERD: cont Pepcid, PPI. Tobacco dependence:tobacco cessation.  DVT prophylaxis: Place TED hose Start: 09/06/21 1507 SCDs Start: 08/31/21 1058 heparin injection 5,000 Units Start: 08/26/21 1615 Code Status:   Code Status: Full Code Family Communication: plan of care discussed with patient at bedside. Patient status is: inpatient  because of ongoing need for inpatient dialysis, will need to be able to sit on chair for dialysis, pending placement to SNF Level of care: Med-Surg  Dispo: The patient is from: home            Anticipated disposition: SNF tbd  Consultants:  Orthopedics: Dr. [name] 08/27/2021 ID: Dr. Gale Journey  08/27/2021 Nephrology: Dr.Schertz 08/27/2021 Vascular surgery: Dr. Unk Lightning 09/06/2021     Procedures:  CT left hip 08/26/2021 Plain films of the right knee 08/26/2021 Plain films of the right foot 08/26/2021 2D echo 10/09/2021 Vascular ultrasound duplex dialysis access 09/07/2021 Fistula revision branch ligation per Dr. Unk Lightning 09/19/2021- Left hip debridement, application of wound VAC by Dr. [name] 08/28/2021  Mobility Assessment (last 72 hours)     Mobility Assessment     Row Name 10/22/21 2030 10/22/21 1200 10/22/21 1121 10/22/21 0745 10/21/21 1000   Does patient have an order for bedrest or is patient medically unstable No - Continue assessment -- -- No - Continue assessment No - Continue assessment   What is the highest level of mobility based on the progressive mobility assessment? Level 3 (Stands with assist) - Balance while standing  and cannot march in place Level 3 (Stands with assist) - Balance while standing  and cannot march in place Level 3 (Stands with assist) - Balance while standing  and cannot march in place Level 3 (Stands with assist) - Balance while standing  and cannot march in place Level 3 (Stands with assist) - Balance while standing  and cannot march in place   Is the above level different from baseline mobility prior to current illness? Yes - Recommend PT order -- -- Yes - Recommend PT order Yes - Recommend PT order    [name] Name 10/20/21 2200           Does patient have an order for bedrest or is patient medically unstable No - Continue assessment       What is the highest level of mobility based on the progressive mobility assessment? Level 2 (Chairfast) - Balance while sitting on edge of bed and cannot stand       Is the above level different from baseline mobility prior to current illness? Yes - Recommend PT order                 Objective: Vitals last 24 hrs: Vitals:   10/23/21 1000 10/23/21 1030 10/23/21 1100 10/23/21 1130  BP: (!) 125/57 111/61 (!) 113/59  114/64  Pulse: 69 67 72 69  Resp: 11 15 15 17   Temp:      TempSrc:      SpO2: 97% 99% 97% 99%  Weight:      Height:       Weight change:   Physical Examination: General exam: alert awake,older than stated age, weak appearing. HEENT:Oral mucosa moist, Ear/Nose WNL grossly, dentition normal. Respiratory system: bilaterally diminished BS, no use of accessory muscle Cardiovascular system: S1 & S2 +, No JVD. Gastrointestinal system: Abdomen soft,NT,ND, BS+ Nervous System:Alert, awake, moving extremities and grossly nonfocal Extremities: LE edema neg,distal peripheral pulses palpable.  Skin: No rashes,no icterus. MSK: Normal muscle bulk,tone, power  Medications reviewed:  Scheduled Meds:  alosetron  1 mg Oral BID   amitriptyline  25 mg Oral QHS   amLODipine  10 mg Oral Q24H   atorvastatin  40 mg Oral q1800   carvedilol  6.25 mg Oral BID   Chlorhexidine Gluconate Cloth  6 each Topical Q0600   Chlorhexidine Gluconate Cloth  6 each Topical Q0600   cyclobenzaprine  10 mg Oral TID   docusate sodium  100 mg Oral BID   DULoxetine  60 mg Oral Daily   famotidine  20 mg Oral Daily   furosemide  80 mg Oral Daily   heparin  5,000 Units Subcutaneous Q8H   heparin sodium (porcine)       hydrALAZINE  25 mg Oral TID   insulin aspart  0-6 Units Subcutaneous TID WC   insulin aspart  5 Units Subcutaneous TID WC   insulin glargine-yfgn  12 Units Subcutaneous QHS   multivitamin  1 tablet Oral QHS   pantoprazole  80 mg Oral Daily   sevelamer carbonate  2,400 mg Oral TID WC   sodium chloride flush  3 mL Intravenous Q12H   Continuous Infusions:  sodium chloride     sodium chloride 10 mL/hr at 09/19/21 1310   methocarbamol (ROBAXIN) IV      Pressure Injury 01/23/20 Buttocks Left Deep Tissue Pressure Injury - Purple or maroon localized area of discolored intact skin or blood-filled blister due to damage of underlying soft tissue from pressure and/or shear. Circular, dark, red area, surrounde  (Active)  01/23/20 1609  Location: Buttocks  Location Orientation: Left  Staging: Deep Tissue Pressure Injury - Purple or maroon localized area of discolored intact skin or blood-filled blister due to damage of underlying soft tissue from pressure and/or shear.  Wound Description (Comments): Circular, dark, red area, surrounded by MASD  Present on Admission: Yes   Diet Order             Diet renal with fluid restriction Fluid restriction: 1200 mL Fluid; Room service appropriate? Yes; Fluid consistency: Thin  Diet effective now                            Intake/Output Summary (Last 24 hours) at 10/23/2021 1144 Last data filed at 10/23/2021 0800 Gross per 24 hour  Intake 1180 ml  Output 0 ml  Net 1180 ml   Net IO Since Admission: -25,366.61 mL [10/23/21 1144]  Wt Readings from Last 3 Encounters:  10/23/21 93.4 kg  08/23/21 96.5 kg  08/07/21 101.1 kg     Unresulted Labs (From admission, onward)     Start     Ordered   08/28/21 0917  MIC Result  Once,   R        08/28/21 0917          Data Reviewed: I have personally reviewed following labs and imaging studies CBC: Recent Labs  Lab 10/18/21 0507 10/21/21 0438 10/21/21 1156 10/22/21 0456 10/23/21 0556  WBC 7.3 7.5 6.9 6.6 6.4  NEUTROABS  --   --   --   --  3.0  HGB 11.7* 11.9* 11.6* 11.9* 12.0  HCT 36.7 38.2 37.1 37.0 37.2  MCV 90.4 89.9 90.9 88.9 86.9  PLT 202 211 219 172 440   Basic Metabolic Panel: Recent Labs  Lab 10/19/21 0259 10/21/21 0438 10/21/21 1157 10/22/21 0456 10/23/21 0556  NA 128* 127* 124* 127* 126*  K 4.8 5.6* 5.6* 4.8 5.1  CL 92* 92* 88* 89* 88*  CO2 27 24 22 25 26   GLUCOSE 123* 94 122* 150* 81  BUN 39* 71* 73* 39* 50*  CREATININE 5.36* 7.33* 7.48* 5.20* 6.27*  CALCIUM 8.9 8.6* 8.6* 8.9 9.3  PHOS 5.9* 7.7* 7.5* 5.9* 6.9*   GFR: Estimated Creatinine Clearance: 12.7 mL/min (A) (by C-G formula based on SCr of 6.27 mg/dL (H)). Liver Function Tests: Recent Labs  Lab  10/19/21 0259 10/21/21 0438 10/21/21 1157 10/22/21 0456 10/23/21  0556  ALBUMIN 2.8* 2.8* 2.9* 2.9* 3.1*   No results for input(s): "LIPASE", "AMYLASE" in the last 168 hours. No results for input(s): "AMMONIA" in the last 168 hours. Coagulation Profile: No results for input(s): "INR", "PROTIME" in the last 168 hours. BNP (last 3 results) No results for input(s): "PROBNP" in the last 8760 hours. HbA1C: No results for input(s): "HGBA1C" in the last 72 hours. CBG: Recent Labs  Lab 10/22/21 0729 10/22/21 1200 10/22/21 1638 10/22/21 2111 10/23/21 0742  GLUCAP 169* 150* 94 122* 73   Lipid Profile: No results for input(s): "CHOL", "HDL", "LDLCALC", "TRIG", "CHOLHDL", "LDLDIRECT" in the last 72 hours. Thyroid Function Tests: No results for input(s): "TSH", "T4TOTAL", "FREET4", "T3FREE", "THYROIDAB" in the last 72 hours. Sepsis Labs: No results for input(s): "PROCALCITON", "LATICACIDVEN" in the last 168 hours.  No results found for this or any previous visit (from the past 240 hour(s)).  Antimicrobials: Anti-infectives (From admission, onward)    Start     Dose/Rate Route Frequency Ordered Stop   10/02/21 2000  DAPTOmycin (CUBICIN) 900 mg in sodium chloride 0.9 % IVPB  Status:  Discontinued        10 mg/kg  92.4 kg 136 mL/hr over 30 Minutes Intravenous Once per day on Mon Wed Fri 10/01/21 1350 10/12/21 1146   09/30/21 2000  DAPTOmycin (CUBICIN) 900 mg in sodium chloride 0.9 % IVPB  Status:  Discontinued        10 mg/kg  92.4 kg 136 mL/hr over 30 Minutes Intravenous Daily 09/28/21 0919 10/01/21 1350   09/28/21 2000  DAPTOmycin (CUBICIN) 900 mg in sodium chloride 0.9 % IVPB        900 mg 136 mL/hr over 30 Minutes Intravenous  Once 09/28/21 0919 09/28/21 2146   09/02/21 2000  DAPTOmycin (CUBICIN) 900 mg in sodium chloride 0.9 % IVPB  Status:  Discontinued        10 mg/kg  92.4 kg 136 mL/hr over 30 Minutes Intravenous Once per day on Mon Wed Fri 09/02/21 0836 09/28/21 0919    08/31/21 0745  ceFAZolin (ANCEF) IVPB 2g/100 mL premix        2 g 200 mL/hr over 30 Minutes Intravenous To Short Stay 08/31/21 0730 08/31/21 0918   08/30/21 0600  ceFAZolin (ANCEF) IVPB 2g/100 mL premix  Status:  Discontinued        2 g 200 mL/hr over 30 Minutes Intravenous To Short Stay 08/30/21 0123 08/31/21 0600   08/29/21 2000  DAPTOmycin (CUBICIN) 900 mg in sodium chloride 0.9 % IVPB  Status:  Discontinued        10 mg/kg  92.4 kg 136 mL/hr over 30 Minutes Intravenous Every 48 hours 08/29/21 1627 09/02/21 0836   08/28/21 1800  ceFAZolin (ANCEF) IVPB 2g/100 mL premix  Status:  Discontinued        2 g 200 mL/hr over 30 Minutes Intravenous Every M-W-F (1800) 08/27/21 0846 08/29/21 1620   08/28/21 1200  ceFAZolin (ANCEF) IVPB 1 g/50 mL premix  Status:  Discontinued        1 g 100 mL/hr over 30 Minutes Intravenous Every M-W-F (Hemodialysis) 08/26/21 1954 08/27/21 0846   08/28/21 0600  ceFAZolin (ANCEF) IVPB 2g/100 mL premix        2 g 200 mL/hr over 30 Minutes Intravenous On call to O.R. 08/27/21 1150 08/28/21 0909   08/27/21 1800  ceFAZolin (ANCEF) IVPB 1 g/50 mL premix        1 g 100 mL/hr over 30 Minutes Intravenous  Once 08/26/21 1954 08/27/21 2048   08/26/21 1500  ceFEPIme (MAXIPIME) 1 g in sodium chloride 0.9 % 100 mL IVPB  Status:  Discontinued        1 g 200 mL/hr over 30 Minutes Intravenous Every 24 hours 08/26/21 1452 08/26/21 1714      Culture/Microbiology    Component Value Date/Time   SDES TISSUE 08/28/2021 [place] TISSUE 08/28/2021 0917   CULT  08/28/2021 0917    MODERATE ENTEROCOCCUS FAECIUM VANCOMYCIN RESISTANT ENTEROCOCCUS ISOLATED NO ANAEROBES ISOLATED Sent to [place] for further susceptibility testing. Performed at [facility], [place] [address], [place], [place] [address]    REPTSTATUS 09/02/2021 FINAL 08/28/2021 1448    Other culture-see note  Radiology Studies: No results found.   LOS: 77 days   Antonieta Pert, MD [facility]  10/23/2021, 11:44 AM

## 2021-10-24 DIAGNOSIS — M86252 Subacute osteomyelitis, left femur: Secondary | ICD-10-CM | POA: Diagnosis not present

## 2021-10-24 LAB — GLUCOSE, CAPILLARY
Glucose-Capillary: 127 mg/dL — ABNORMAL HIGH (ref 70–99)
Glucose-Capillary: 162 mg/dL — ABNORMAL HIGH (ref 70–99)
Glucose-Capillary: 120 mg/dL — ABNORMAL HIGH (ref 70–99)
Glucose-Capillary: 170 mg/dL — ABNORMAL HIGH (ref 70–99)

## 2021-10-24 MED ORDER — CHLORHEXIDINE GLUCONATE CLOTH 2 % EX PADS
6.0000 | MEDICATED_PAD | Freq: Every day | CUTANEOUS | Status: DC
  Administered 2021-10-24: 6 via TOPICAL

## 2021-10-24 NOTE — Progress Notes (Signed)
Physical Therapy Treatment Patient Details Name: [name] MRN: [id number] DOB: 1965-04-29 Today's Date: 10/24/2021   History of Present Illness 56 y.o. female presented to ED 6/26 from dialysis with increased bloody drainage from L hip wound. 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 s/p hardware removal of left hip with partial resection of tuft tissue and femure that were nonviable on 08/10/21 Discharged home 6/23. underwent L hip debridement and wound vac placement 6/28; +Left intertrochanteric non-union,  Fracture of left distal femur  PMH: hypertension, hyperlipidemia, ESRD on HD MWF, history of left BKA in 2021, depression/anxiety, stroke, tobacco use, T2DM,  insomnia, chronic pain syndrome,    PT Comments    Patient up in chair on arrival (up to chair with OT). Able to complete sliding board transfer with min guard assist for safety from chair to bed (chair elevated surface with cushion and blankets). Worked on standing from elevated EOB with RW and +2 mod assist. Pt stood for 115 seconds.     Recommendations for follow up therapy are one component of a multi-disciplinary discharge planning process, led by the attending physician.  Recommendations may be updated based on patient status, additional functional criteria and insurance authorization.  Follow Up Recommendations  Skilled nursing-short term rehab (<3 hours/day) Can patient physically be transported by private vehicle: No   Assistance Recommended at Discharge Intermittent Supervision/Assistance  Patient can return home with the following A lot of help with bathing/dressing/bathroom;Assistance with cooking/housework;Assist for transportation;Two people to help with walking and/or transfers;Help with stairs or ramp for entrance   Equipment Recommendations  Other (comment) (Seems well-equipped; agree with looking into medical [name] transport for HD)    Recommendations for Other Services        Precautions / Restrictions Precautions Precautions: Fall;Other (comment) Precaution Comments: L BKA (baseline) Restrictions Weight Bearing Restrictions: No LLE Weight Bearing: Weight bearing as tolerated Other Position/Activity Restrictions: L distal femur fx; per Dr [name] 8/10 pt can full WB     Mobility  Bed Mobility Overal bed mobility: Needs Assistance Bed Mobility: Sit to Supine, Supine to Sit     Supine to sit: Modified independent (Device/Increase time) Sit to supine: Modified independent (Device/Increase time)   General bed mobility comments: increased time and effort    Transfers Overall transfer level: Needs assistance Equipment used: Rolling walker (2 wheels) Transfers: Sit to/from Stand Sit to Stand: +2 physical assistance, Mod assist          Lateral/Scoot Transfers: Min guard, With slide board (from chair to bed) General transfer comment: from elevated bed with use of gait belt and pad under hips; pt places both hands on RW to come to stand; attempted x 3 with 1 successful x 115 seconds; cuing for upright posture    Ambulation/Gait                   Stairs             Wheelchair Mobility    Modified Rankin (Stroke Patients Only)       Balance Overall balance assessment: Needs assistance Sitting-balance support: Feet supported Sitting balance-Leahy Scale: Good Sitting balance - Comments: no assistance with sitting balance   Standing balance support: Bilateral upper extremity supported, During functional activity, Reliant on assistive device for balance Standing balance-Leahy Scale: Poor Standing balance comment: reliant on RW for support  Cognition Arousal/Alertness: Awake/alert Behavior During Therapy: WFL for tasks assessed/performed Overall Cognitive Status: Within Functional Limits for tasks assessed                                          Exercises Other  Exercises Other Exercises: pt did LAQ with RLE prior to standing trials to decr knee pain Other Exercises: pt continues to work on LLE internal rotation throughout the day    General Comments        Pertinent Vitals/Pain Pain Assessment Pain Assessment: Faces Faces Pain Scale: Hurts even more Pain Location: back Pain Descriptors / Indicators: Aching, Sore Pain Intervention(s): Limited activity within patient's tolerance, Monitored during session, Patient requesting pain meds-RN notified    Home Living                          Prior Function            PT Goals (current goals can now be found in the care plan section) Acute Rehab PT Goals Patient Stated Goal: less pain with movement PT Goal Formulation: With patient Time For Goal Achievement: 11/07/21 Potential to Achieve Goals: Fair Progress towards PT goals:  (goals updated based on timeframe)    Frequency    Min 2X/week      PT Plan Current plan remains appropriate    Co-evaluation              AM-PAC PT "6 Clicks" Mobility   Outcome Measure  Help needed turning from your back to your side while in a flat bed without using bedrails?: None Help needed moving from lying on your back to sitting on the side of a flat bed without using bedrails?: None Help needed moving to and from a bed to a chair (including a wheelchair)?: A Little Help needed standing up from a chair using your arms (e.g., wheelchair or bedside chair)?: Total Help needed to walk in hospital room?: Total Help needed climbing 3-5 steps with a railing? : Total 6 Click Score: 14    End of Session Equipment Utilized During Treatment: Gait belt Activity Tolerance: Patient tolerated treatment well Patient left: in bed;with call bell/phone within reach   PT Visit Diagnosis: Other abnormalities of gait and mobility (R26.89);Muscle weakness (generalized) (M62.81);History of falling (Z91.81);Pain Pain - Right/Left: Left Pain - part of  body: Leg     Time: 1404-1430 PT Time Calculation (min) (ACUTE ONLY): 26 min  Charges:  $Gait Training: 8-22 mins $Therapeutic Activity: 8-22 mins                      [place]  Office [phone]    [name] 10/24/2021, 2:43 PM

## 2021-10-24 NOTE — Progress Notes (Signed)
Occupational Therapy Treatment Patient Details Name: [name] MRN: [id number] DOB: [birth year] Today's Date: 10/24/2021   History of present illness 56 y.o. female presented to ED 6/26 from dialysis with increased bloody drainage from L hip wound. 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 s/p hardware removal of left hip with partial resection of tuft tissue and femure that were nonviable on 08/10/21 Discharged home 6/23. underwent L hip debridement and wound vac placement 6/28; +Left intertrochanteric non-union,  Fracture of left distal femur  PMH: hypertension, hyperlipidemia, ESRD on HD MWF, history of left BKA in 2021, depression/anxiety, stroke, tobacco use, T2DM,  insomnia, chronic pain syndrome,   OT comments  Patient received in supine and agreeable to OT treatment. Patient was able to get to EOB without assistance and used sliding board for transfer to recliner with min guard assist. Patient performed grooming and UE HEP while seated in recliner. Patient has increased resistant band from yellow to red. Acute OT to continue to follow.    Recommendations for follow up therapy are one component of a multi-disciplinary discharge planning process, led by the attending physician.  Recommendations may be updated based on patient status, additional functional criteria and insurance authorization.    Follow Up Recommendations  Skilled nursing-short term rehab (<3 hours/day)    Assistance Recommended at Discharge Intermittent Supervision/Assistance  Patient can return home with the following  A lot of help with bathing/dressing/bathroom;Assistance with cooking/housework;Assist for transportation;Help with stairs or ramp for entrance;Two people to help with walking and/or transfers   Equipment Recommendations  Other (comment) (TBD)    Recommendations for Other Services      Precautions / Restrictions Precautions Precautions: Fall;Other  (comment) Precaution Comments: L BKA (baseline) Restrictions Weight Bearing Restrictions: No LLE Weight Bearing: Weight bearing as tolerated Other Position/Activity Restrictions: L distal femur fx; per Dr [name] 8/10 pt can full WB       Mobility Bed Mobility Overal bed mobility: Needs Assistance Bed Mobility: Supine to Sit     Supine to sit: Modified independent (Device/Increase time)     General bed mobility comments: increased time and effort    Transfers Overall transfer level: Needs assistance Equipment used: Sliding board Transfers: Bed to chair/wheelchair/BSC            Lateral/Scoot Transfers: Min guard, With slide board (bed to chair) General transfer comment: assistance with positioning sliding board and use of bed pad to use slide board for transfer from EOB to recliner     Balance Overall balance assessment: Needs assistance Sitting-balance support: Feet supported Sitting balance-Leahy Scale: Good Sitting balance - Comments: no assistance with sitting balance                                   ADL either performed or assessed with clinical judgement   ADL Overall ADL's : Needs assistance/impaired     Grooming: Brushing hair;Set up;Sitting                                 General ADL Comments: declined donning pants    Extremity/Trunk Assessment              Vision       Perception     Praxis      Cognition Arousal/Alertness: Awake/alert Behavior During Therapy: WFL for tasks assessed/performed Overall Cognitive Status:  Within Functional Limits for tasks assessed                                          Exercises Exercises: General Upper Extremity General Exercises - Upper Extremity Shoulder Flexion: Strengthening, Both, 10 reps, Theraband, Seated Theraband Level (Shoulder Flexion): Level 1 (Yellow) Shoulder ABduction: Strengthening, Both, 15 reps, Theraband, Seated Theraband Level  (Shoulder Abduction): Level 2 (Red) Elbow Flexion: Strengthening, Both, 10 reps, Theraband, Seated Theraband Level (Elbow Flexion): Level 2 (Red) Elbow Extension: Strengthening, Both, 10 reps, Theraband, Seated Theraband Level (Elbow Extension): Level 2 (Red)    Shoulder Instructions       General Comments      Pertinent Vitals/ Pain       Pain Assessment Pain Assessment: Faces Faces Pain Scale: Hurts little more Pain Location: back Pain Descriptors / Indicators: Aching, Sore Pain Intervention(s): Monitored during session, Repositioned  Home Living                                          Prior Functioning/Environment              Frequency  Min 2X/week        Progress Toward Goals  OT Goals(current goals can now be found in the care plan section)  Progress towards OT goals: Progressing toward goals  Acute Rehab OT Goals Patient Stated Goal: get better OT Goal Formulation: With patient Time For Goal Achievement: 11/05/21 Potential to Achieve Goals: Fair ADL Goals Pt Will Perform Grooming: with modified independence;sitting Pt Will Perform Lower Body Bathing: with supervision;sitting/lateral leans;with adaptive equipment Pt Will Perform Lower Body Dressing: with adaptive equipment;sitting/lateral leans;with supervision Pt Will Transfer to Toilet: with min assist;with +2 assist;bedside commode;with transfer board Pt Will Perform Toileting - Clothing Manipulation and hygiene: with min assist;sitting/lateral leans Pt/caregiver will Perform Home Exercise Program: Increased ROM;Increased strength;Both right and left upper extremity;With written HEP provided;Independently;With theraband Additional ADL Goal #1: Pt will perform bed mobility modified independently in preparation for ADLs with HOB flat. Additional ADL Goal #2: Pt will be able to don prosthesis with min A in prep for ADLs/mobility  Plan Discharge plan remains appropriate;Frequency remains  appropriate    Co-evaluation                 AM-PAC OT "6 Clicks" Daily Activity     Outcome Measure   Help from another person eating meals?: None Help from another person taking care of personal grooming?: None Help from another person toileting, which includes using toliet, bedpan, or urinal?: A Lot Help from another person bathing (including washing, rinsing, drying)?: A Lot Help from another person to put on and taking off regular upper body clothing?: A Little Help from another person to put on and taking off regular lower body clothing?: A Lot 6 Click Score: 17    End of Session Equipment Utilized During Treatment: Other (comment) (sliding board)  OT Visit Diagnosis: Muscle weakness (generalized) (M62.81);Unsteadiness on feet (R26.81);History of falling (Z91.81)   Activity Tolerance Patient tolerated treatment well   Patient Left in chair;with call bell/phone within reach   Nurse Communication Mobility status        Time: 1287-8676 OT Time Calculation (min): 34 min  Charges: OT General Charges $OT Visit: 1 Visit OT Treatments $  Therapeutic Activity: 8-22 mins $Therapeutic Exercise: 8-22 mins  [name], OTA Acute Rehabilitation Services  Office [phone]   [name] 10/24/2021, 3:17 PM

## 2021-10-24 NOTE — Progress Notes (Signed)
[facility] Progress Note   Subjective:   Tolerated HD yesterday with 5L UF. Reports mild nausea this AM. Denies SOB, CP, and dizziness. Discussed sitting in chair in her room to try to increase her tolerance for HD.   Objective Vitals:   10/23/21 1206 10/23/21 1539 10/23/21 2103 10/24/21 0546  BP: 124/61 (!) 128/58 125/64 (!) 128/52  Pulse: 71 66 63 63  Resp: 12 18 17 18   Temp: (!) 97.4 F (36.3 C)  98 F (36.7 C) 98.3 F (36.8 C)  TempSrc: Oral   Oral  SpO2: 98% 97% 100% 97%  Weight: 89.4 kg     Height:       Physical Exam General: Well appearing female, alert and in NAD Heart: RRR, no murmurs, rubs or gallops Lungs: CTA bilaterally Abdomen: Soft, non-distended, +BS Extremities: L BKA 1+ edema, no edema RLE Dialysis Access: LUE AVF + bruit  Additional Objective Labs: Basic Metabolic Panel: Recent Labs  Lab 10/21/21 1157 10/22/21 0456 10/23/21 0556  NA 124* 127* 126*  K 5.6* 4.8 5.1  CL 88* 89* 88*  CO2 22 25 26   GLUCOSE 122* 150* 81  BUN 73* 39* 50*  CREATININE 7.48* 5.20* 6.27*  CALCIUM 8.6* 8.9 9.3  PHOS 7.5* 5.9* 6.9*   Liver Function Tests: Recent Labs  Lab 10/21/21 1157 10/22/21 0456 10/23/21 0556  ALBUMIN 2.9* 2.9* 3.1*   No results for input(s): "LIPASE", "AMYLASE" in the last 168 hours. CBC: Recent Labs  Lab 10/18/21 0507 10/21/21 0438 10/21/21 1156 10/22/21 0456 10/23/21 0556  WBC 7.3 7.5 6.9 6.6 6.4  NEUTROABS  --   --   --   --  3.0  HGB 11.7* 11.9* 11.6* 11.9* 12.0  HCT 36.7 38.2 37.1 37.0 37.2  MCV 90.4 89.9 90.9 88.9 86.9  PLT 202 211 219 172 194   Blood Culture    Component Value Date/Time   SDES TISSUE 08/28/2021 0917   SPECREQUEST LEFT THIGH TISSUE 08/28/2021 0917   CULT  08/28/2021 0917    MODERATE ENTEROCOCCUS FAECIUM VANCOMYCIN RESISTANT ENTEROCOCCUS ISOLATED NO ANAEROBES ISOLATED Sent to [facility] for further susceptibility testing. Performed at [facility], [place] [address] [place],  [place] [id number]    REPTSTATUS 09/02/2021 FINAL 08/28/2021 2683    Cardiac Enzymes: No results for input(s): "CKTOTAL", "CKMB", "CKMBINDEX", "TROPONINI" in the last 168 hours. CBG: Recent Labs  Lab 10/23/21 0742 10/23/21 1311 10/23/21 1815 10/23/21 2103 10/24/21 0725  GLUCAP 73 205* 154* 170* 127*   Iron Studies: No results for input(s): "IRON", "TIBC", "TRANSFERRIN", "FERRITIN" in the last 72 hours. @lablastinr3 @ Studies/Results: No results found. Medications:  sodium chloride     sodium chloride 10 mL/hr at 09/19/21 1310   methocarbamol (ROBAXIN) IV      alosetron  1 mg Oral BID   amitriptyline  25 mg Oral QHS   amLODipine  10 mg Oral Q24H   atorvastatin  40 mg Oral q1800   carvedilol  6.25 mg Oral BID   Chlorhexidine Gluconate Cloth  6 each Topical Q0600   Chlorhexidine Gluconate Cloth  6 each Topical Q0600   cyclobenzaprine  10 mg Oral TID   docusate sodium  100 mg Oral BID   DULoxetine  60 mg Oral Daily   famotidine  20 mg Oral Daily   furosemide  80 mg Oral Daily   heparin  5,000 Units Subcutaneous Q8H   hydrALAZINE  25 mg Oral TID   insulin aspart  0-6 Units Subcutaneous TID WC  insulin aspart  5 Units Subcutaneous TID WC   insulin glargine-yfgn  12 Units Subcutaneous QHS   multivitamin  1 tablet Oral QHS   pantoprazole  80 mg Oral Daily   sevelamer carbonate  2,400 mg Oral TID WC   sodium chloride flush  3 mL Intravenous Q12H    Dialysis Orders: MWF at [facility], 400/500, EDW 90kg, 2K/2Ca, LUE AVF, heparin 4000 unit bolus    Assessment/Plan: 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 Back to OR 08/28/21, 08/31/21 for L hip debridement and wound vac placement (now removed). Finished 6 week course of IV Daptomycin on 10/12/21. 2. ESRD: Continue HD on MWF schedule. Intermittent AVF cannulation issues but working well recently. Will continue to monitor. Will continue to encourage to sit in chair for HD.  3. AVF Access: S/p branch  ligation in OR 09/19/2021 per Dr. Virl Cagey.  4. HTN/ volume: BP controlled. On amlodipine/hydralazine/Coreg/Lasix. Still with edema but improved. Max UF as tolerated and has been reminded of importance of following fluid and sodium restrictions.  5. Anemia of ESRD: Hgb 12. Not on ESA.  6. Secondary HPTH: Cca controlled. Phos elevated. Continue Renvela as binder-increased dose. No VDRA for now. 7. DM2: Insulin per primary 8. Dispo: SNF recommended.  Cannot tolerate sitting for prolonged periods and not willing to try HD in a chair, which she will need to be able to do outpatient. Per PT notes -now willing to go to SNF. She says her goal is to be able to walk prior to discharge.    Anice Paganini, PA-C 10/24/2021, 9:26 AM  [facility] Pager: [phone]

## 2021-10-24 NOTE — Plan of Care (Signed)
  Problem: Pain Managment: Goal: General experience of comfort will improve Outcome: Progressing   

## 2021-10-24 NOTE — Progress Notes (Signed)
PROGRESS NOTE [name]  GGE:[id number] DOB: 12/17/1965 DOA: 08/26/2021 PCP: [name], MD   Brief Narrative/Hospital Course: 56 year old woman PMH including ESRD, anemia of CKD, uncontrolled type 2 diabetes, hyperlipidemia, history of CVA, anxiety/depression, GERD, tobacco dependence complicated left hip and femur issues requiring multiple surgeries, removal of hardware presented 6/26 with bloody drainage from surgical incision.  Seen by orthopedics and underwent I&D 6/28 and 7/1.  Currently unable to progress care secondary to refusal to sit in chair for HD.    Subjective: Seen and examined this morning resting comfortably.  Reports she is unable to use left BKA prosthesis due to his swelling  Has no complaints   Assessment and Plan: 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  Active Problems:   ESRD (end stage renal disease) on dialysis (HCC)   Type 2 diabetes mellitus with hyperlipidemia (HCC)   History of CVA (cerebrovascular accident)   Essential hypertension   Anxiety and depression   GASTROESOPHAGEAL REFLUX, NO ESOPHAGITIS   TOBACCO DEPENDENCE   Wound infection   Wound dehiscence, surgical, sequela  Subacute osteomyelitis left femur with abscess Infected left hip hardware s/p removal Left intertrochanteric fracture: Seen by orthopedics, underwent left hip debridement, wound VAC placement Dr. [name] (08/28/2021)-VAC removed and now on wound care ortho has cleared the patient for discharge.  Patient completed 6 weeks of daptomycin on 8/12/202.Patient seen later on during the hospitalization by Dr. [name] who recommended lymphedema pump as outpatient,pump cannot be arranged in-house. Generalized edema improving with dialysis.Outpatient follow-up with orthopedics, ID, nephrology.  ESRD on HD  MWF- per nephro. She needs to be able to sit in the chair to tolerate outpatient dialysis prior to discharge.  AV fistula revision and plans ligation done by  vascular.  Continue lasix Anasarca continue HD and Lasix. Volume overload improved on dialysis.TTE 10/09/2021 with EF of 60 to 65%, NWMA.Net IO Since Admission: -52,955.61 mL [10/24/21 0918]  Hyponatremia: Continue per nephrology with HD.  Monitor Recent Labs  Lab 10/19/21 0259 10/21/21 0438 10/21/21 1157 10/22/21 0456 10/23/21 0556  NA 128* 127* 124* 127* 126*   Hyperkalemia resolved on HD.  Monitor Recent Labs  Lab 10/19/21 0259 10/21/21 0438 10/21/21 1157 10/22/21 0456 10/23/21 0556  K 4.8 5.6* 5.6* 4.8 5.1   Anemia of chronic renal disease/CKD with acute blood loss anemia complaint from wound:Hemoglobin stable.  Monitor, Aranesp per nephrology. improved Recent Labs  Lab 10/18/21 0507 10/21/21 0438 10/21/21 1156 10/22/21 0456 10/23/21 0556  HGB 11.7* 11.9* 11.6* 11.9* 12.0  HCT 36.7 38.2 37.1 37.0 37.2     Type 2 diabetes mellitus with uncontrolled hyperglycemia A1c 7.1, blood sugar stable continue Semglee 12 units bedtime and 5 units NovoLog Premeal and NovoLog SSI Recent Labs  Lab 10/23/21 0742 10/23/21 1311 10/23/21 1815 10/23/21 2103 10/24/21 0725  GLUCAP 73 205* 154* 170* 127*   Hypertension: Blood pressure controlled on  Coreg, Norvasc, Lasix, hydralazine.hydralazine dose has been decreased to 25 mg 3 times daily on 8/17.  Monitor.  Hyperlipidemia/History of YYT:KPTWSFKC statin.  Depression/anxiety Stable.cont her Elavil/Cymbalta/Flexeril GERD: cont Pepcid, PPI. Tobacco dependence:tobacco cessation.  DVT prophylaxis: Place TED hose Start: 09/06/21 1507 SCDs Start: 08/31/21 1058 heparin injection 5,000 Units Start: 08/26/21 1615 Code Status:   Code Status: Full Code Family Communication: plan of care discussed with patient at bedside. Patient status is: inpatient  because of ongoing need for inpatient dialysis, will need to be able to sit on chair for dialysis, pending placement to SNF Level  of care: Med-Surg   Dispo: The patient is from: home             Anticipated disposition: SNF tbd  Consultants:  Orthopedics: Dr. [name] 08/27/2021 ID: Dr. Gale Journey 08/27/2021 Nephrology: Dr.Schertz 08/27/2021 Vascular surgery: Dr. Unk Lightning 09/06/2021     Procedures:  CT left hip 08/26/2021 Plain films of the right knee 08/26/2021 Plain films of the right foot 08/26/2021 2D echo 10/09/2021 Vascular ultrasound duplex dialysis access 09/07/2021 Fistula revision branch ligation per Dr. Unk Lightning 09/19/2021- Left hip debridement, application of wound VAC by Dr. [name] 08/28/2021  Mobility Assessment (last 72 hours)     Mobility Assessment     Row Name 10/23/21 1330 10/22/21 2030 10/22/21 1200 10/22/21 1121 10/22/21 0745   Does patient have an order for bedrest or is patient medically unstable No - Continue assessment No - Continue assessment -- -- No - Continue assessment   What is the highest level of mobility based on the progressive mobility assessment? Level 3 (Stands with assist) - Balance while standing  and cannot march in place Level 3 (Stands with assist) - Balance while standing  and cannot march in place Level 3 (Stands with assist) - Balance while standing  and cannot march in place Level 3 (Stands with assist) - Balance while standing  and cannot march in place Level 3 (Stands with assist) - Balance while standing  and cannot march in place   Is the above level different from baseline mobility prior to current illness? Yes - Recommend PT order Yes - Recommend PT order -- -- Yes - Recommend PT order    [place] Name 10/21/21 1000           Does patient have an order for bedrest or is patient medically unstable No - Continue assessment       What is the highest level of mobility based on the progressive mobility assessment? Level 3 (Stands with assist) - Balance while standing  and cannot march in place       Is the above level different from baseline mobility prior to current illness? Yes - Recommend PT order                 Objective: Vitals last 24  hrs: Vitals:   10/23/21 1206 10/23/21 1539 10/23/21 2103 10/24/21 0546  BP: 124/61 (!) 128/58 125/64 (!) 128/52  Pulse: 71 66 63 63  Resp: 12 18 17 18   Temp: (!) 97.4 F (36.3 C)  98 F (36.7 C) 98.3 F (36.8 C)  TempSrc: Oral   Oral  SpO2: 98% 97% 100% 97%  Weight: 89.4 kg     Height:       Weight change:   Physical Examination: General exam: AA OX3, older than stated age, weak appearing. HEENT:Oral mucosa moist, Ear/Nose WNL grossly, dentition normal. Respiratory system: bilaterally diminished, no use of accessory muscle Cardiovascular system: S1 & S2 +, No JVD,. Gastrointestinal system: Abdomen soft,NT,ND,BS+ Nervous System:Alert, awake, moving extremities and grossly nonfocal Extremities: lt bka w/ sleeve on, mild swelling RLE Skin: No rashes,no icterus. MSK: Normal muscle bulk,tone, power   Medications reviewed:  Scheduled Meds:  alosetron  1 mg Oral BID   amitriptyline  25 mg Oral QHS   amLODipine  10 mg Oral Q24H   atorvastatin  40 mg Oral q1800   carvedilol  6.25 mg Oral BID   Chlorhexidine Gluconate Cloth  6 each Topical Q0600   Chlorhexidine Gluconate Cloth  6 each Topical Q0600  cyclobenzaprine  10 mg Oral TID   docusate sodium  100 mg Oral BID   DULoxetine  60 mg Oral Daily   famotidine  20 mg Oral Daily   furosemide  80 mg Oral Daily   heparin  5,000 Units Subcutaneous Q8H   hydrALAZINE  25 mg Oral TID   insulin aspart  0-6 Units Subcutaneous TID WC   insulin aspart  5 Units Subcutaneous TID WC   insulin glargine-yfgn  12 Units Subcutaneous QHS   multivitamin  1 tablet Oral QHS   pantoprazole  80 mg Oral Daily   sevelamer carbonate  2,400 mg Oral TID WC   sodium chloride flush  3 mL Intravenous Q12H   Continuous Infusions:  sodium chloride     sodium chloride 10 mL/hr at 09/19/21 1310   methocarbamol (ROBAXIN) IV      Diet Order             Diet renal with fluid restriction Fluid restriction: 1200 mL Fluid; Room service appropriate? Yes;  Fluid consistency: Thin  Diet effective now                   Intake/Output Summary (Last 24 hours) at 10/24/2021 0918 Last data filed at 10/24/2021 0000 Gross per 24 hour  Intake 340 ml  Output 4302 ml  Net -3962 ml    Net IO Since Admission: -52,955.61 mL [10/24/21 0918]  Wt Readings from Last 3 Encounters:  10/23/21 89.4 kg  08/23/21 96.5 kg  08/07/21 101.1 kg     Unresulted Labs (From admission, onward)     Start     Ordered   08/28/21 0917  MIC Result  Once,   R        08/28/21 0917          Data Reviewed: I have personally reviewed following labs and imaging studies CBC: Recent Labs  Lab 10/18/21 0507 10/21/21 0438 10/21/21 1156 10/22/21 0456 10/23/21 0556  WBC 7.3 7.5 6.9 6.6 6.4  NEUTROABS  --   --   --   --  3.0  HGB 11.7* 11.9* 11.6* 11.9* 12.0  HCT 36.7 38.2 37.1 37.0 37.2  MCV 90.4 89.9 90.9 88.9 86.9  PLT 202 211 219 172 914    Basic Metabolic Panel: Recent Labs  Lab 10/19/21 0259 10/21/21 0438 10/21/21 1157 10/22/21 0456 10/23/21 0556  NA 128* 127* 124* 127* 126*  K 4.8 5.6* 5.6* 4.8 5.1  CL 92* 92* 88* 89* 88*  CO2 27 24 22 25 26   GLUCOSE 123* 94 122* 150* 81  BUN 39* 71* 73* 39* 50*  CREATININE 5.36* 7.33* 7.48* 5.20* 6.27*  CALCIUM 8.9 8.6* 8.6* 8.9 9.3  PHOS 5.9* 7.7* 7.5* 5.9* 6.9*    GFR: Estimated Creatinine Clearance: 12.5 mL/min (A) (by C-G formula based on SCr of 6.27 mg/dL (H)). Liver Function Tests: Recent Labs  Lab 10/19/21 0259 10/21/21 0438 10/21/21 1157 10/22/21 0456 10/23/21 0556  ALBUMIN 2.8* 2.8* 2.9* 2.9* 3.1*    No results for input(s): "LIPASE", "AMYLASE" in the last 168 hours. No results for input(s): "AMMONIA" in the last 168 hours. Coagulation Profile: No results for input(s): "INR", "PROTIME" in the last 168 hours. BNP (last 3 results) No results for input(s): "PROBNP" in the last 8760 hours. HbA1C: No results for input(s): "HGBA1C" in the last 72 hours. CBG: Recent Labs  Lab  10/23/21 0742 10/23/21 1311 10/23/21 1815 10/23/21 2103 10/24/21 0725  GLUCAP 73 205* 154* 170* 127*  No results found for this or any previous visit (from the past 240 hour(s)).  Antimicrobials: Anti-infectives (From admission, onward)    Start     Dose/Rate Route Frequency Ordered Stop   10/02/21 2000  DAPTOmycin (CUBICIN) 900 mg in sodium chloride 0.9 % IVPB  Status:  Discontinued        10 mg/kg  92.4 kg 136 mL/hr over 30 Minutes Intravenous Once per day on Mon Wed Fri 10/01/21 1350 10/12/21 1146   09/30/21 2000  DAPTOmycin (CUBICIN) 900 mg in sodium chloride 0.9 % IVPB  Status:  Discontinued        10 mg/kg  92.4 kg 136 mL/hr over 30 Minutes Intravenous Daily 09/28/21 0919 10/01/21 1350   09/28/21 2000  DAPTOmycin (CUBICIN) 900 mg in sodium chloride 0.9 % IVPB        900 mg 136 mL/hr over 30 Minutes Intravenous  Once 09/28/21 0919 09/28/21 2146   09/02/21 2000  DAPTOmycin (CUBICIN) 900 mg in sodium chloride 0.9 % IVPB  Status:  Discontinued        10 mg/kg  92.4 kg 136 mL/hr over 30 Minutes Intravenous Once per day on Mon Wed Fri 09/02/21 0836 09/28/21 0919   08/31/21 0745  ceFAZolin (ANCEF) IVPB 2g/100 mL premix        2 g 200 mL/hr over 30 Minutes Intravenous To Short Stay 08/31/21 0730 08/31/21 0918   08/30/21 0600  ceFAZolin (ANCEF) IVPB 2g/100 mL premix  Status:  Discontinued        2 g 200 mL/hr over 30 Minutes Intravenous To Short Stay 08/30/21 0123 08/31/21 0600   08/29/21 2000  DAPTOmycin (CUBICIN) 900 mg in sodium chloride 0.9 % IVPB  Status:  Discontinued        10 mg/kg  92.4 kg 136 mL/hr over 30 Minutes Intravenous Every 48 hours 08/29/21 1627 09/02/21 0836   08/28/21 1800  ceFAZolin (ANCEF) IVPB 2g/100 mL premix  Status:  Discontinued        2 g 200 mL/hr over 30 Minutes Intravenous Every M-W-F (1800) 08/27/21 0846 08/29/21 1620   08/28/21 1200  ceFAZolin (ANCEF) IVPB 1 g/50 mL premix  Status:  Discontinued        1 g 100 mL/hr over 30 Minutes  Intravenous Every M-W-F (Hemodialysis) 08/26/21 1954 08/27/21 0846   08/28/21 0600  ceFAZolin (ANCEF) IVPB 2g/100 mL premix        2 g 200 mL/hr over 30 Minutes Intravenous On call to O.R. 08/27/21 1150 08/28/21 0909   08/27/21 1800  ceFAZolin (ANCEF) IVPB 1 g/50 mL premix        1 g 100 mL/hr over 30 Minutes Intravenous  Once 08/26/21 1954 08/27/21 2048   08/26/21 1500  ceFEPIme (MAXIPIME) 1 g in sodium chloride 0.9 % 100 mL IVPB  Status:  Discontinued        1 g 200 mL/hr over 30 Minutes Intravenous Every 24 hours 08/26/21 1452 08/26/21 1714      Culture/Microbiology    Component Value Date/Time   SDES TISSUE 08/28/2021 [place] TISSUE 08/28/2021 0917   CULT  08/28/2021 0917    MODERATE ENTEROCOCCUS FAECIUM VANCOMYCIN RESISTANT ENTEROCOCCUS ISOLATED NO ANAEROBES ISOLATED Sent to [place] for further susceptibility testing. Performed at [facility], [place] [address] [place], [place] [address]    REPTSTATUS 09/02/2021 FINAL 08/28/2021 1751    Other culture-see note  Radiology Studies: No results found.   LOS: 59 days   Maren [place]  Lupita Leash, MD [facility]  10/24/2021, 9:18 AM

## 2021-10-25 DIAGNOSIS — M86252 Subacute osteomyelitis, left femur: Secondary | ICD-10-CM | POA: Diagnosis not present

## 2021-10-25 LAB — CBC
HCT: 37.5 % (ref 36.0–46.0)
Hemoglobin: 12.2 g/dL (ref 12.0–15.0)
MCH: 28.5 pg (ref 26.0–34.0)
MCHC: 32.5 g/dL (ref 30.0–36.0)
MCV: 87.6 fL (ref 80.0–100.0)
Platelets: 197 10*3/uL (ref 150–400)
RBC: 4.28 MIL/uL (ref 3.87–5.11)
RDW: 15.4 % (ref 11.5–15.5)
WBC: 6.5 10*3/uL (ref 4.0–10.5)
nRBC: 0 % (ref 0.0–0.2)

## 2021-10-25 LAB — GLUCOSE, CAPILLARY
Glucose-Capillary: 167 mg/dL — ABNORMAL HIGH (ref 70–99)
Glucose-Capillary: 191 mg/dL — ABNORMAL HIGH (ref 70–99)
Glucose-Capillary: 131 mg/dL — ABNORMAL HIGH (ref 70–99)

## 2021-10-25 LAB — RENAL FUNCTION PANEL
Albumin: 2.9 g/dL — ABNORMAL LOW (ref 3.5–5.0)
Anion gap: 8 (ref 5–15)
BUN: 50 mg/dL — ABNORMAL HIGH (ref 6–20)
CO2: 25 mmol/L (ref 22–32)
Calcium: 8.7 mg/dL — ABNORMAL LOW (ref 8.9–10.3)
Chloride: 91 mmol/L — ABNORMAL LOW (ref 98–111)
Creatinine, Ser: 5.63 mg/dL — ABNORMAL HIGH (ref 0.44–1.00)
GFR, Estimated: 8 mL/min — ABNORMAL LOW (ref >60)
Glucose, Bld: 125 mg/dL — ABNORMAL HIGH (ref 70–99)
Phosphorus: 6.3 mg/dL — ABNORMAL HIGH (ref 2.5–4.6)
Potassium: 5 mmol/L (ref 3.5–5.1)
Sodium: 124 mmol/L — ABNORMAL LOW (ref 135–145)

## 2021-10-25 MED ORDER — HEPARIN SODIUM (PORCINE) 1000 UNIT/ML DIALYSIS: 4000.0000 [IU] | Freq: Once | INTRAMUSCULAR | Status: AC

## 2021-10-25 MED ORDER — HEPARIN SODIUM (PORCINE) 1000 UNIT/ML IJ SOLN
INTRAMUSCULAR | Status: AC
  Administered 2021-10-25: 4000 [IU] via INTRAVENOUS_CENTRAL
  Filled 2021-10-25: qty 4

## 2021-10-25 NOTE — Progress Notes (Addendum)
Subjective:  Seen on Hd in Bed, didn't want to try recliner yet 2/2 Pain , noted by PT up in chair  yest. Recom= NHP  , pt refuses again  today .   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 Back to OR 08/28/21, 08/31/21 for L hip debrid. and wound vac placement (now removed). Finished 6 week course of IV Daptomycin on 10/12/21. 2. ESRD: MWF schedule. Intermittent AVF cannulation issues ,now ok./continue to encourage to sit in chair for HD.  3. AVF Access: S/p branch ligation in OR 09/19/2021 per Dr. Virl Cagey.  4. HTN/ volume: BP controlled. On amlodipine/hydralazine/Coreg. May be able to taper off meds with uf on hd .Still has edema but improved. Max UF as tolerated and has been reminded of importance of following fluid and sodium restrictions.  5. Anemia of ESRD: Hgb 12.2 Not on ESA.  6. Secondary HPTH: Cca controlled. Phos elevated 6.9 yest . Continue Renvela as binder-increased dose. No VDRA for now. 7. DM2: Insulin per primary 8. Dispo: SNF recommended. STells me this refuses But She  Cannot tolerate sitting for prolonged periods and not willing to try HD in a chair, which she will need to be able to do outpatient. Per PT notes" She says her goal is to be able to walk prior to discharge.  "   Ernest Haber, PA-C [facility] [phone] 10/25/2021,8:08  AM  LOS: 60 days   Labs: Basic Metabolic Panel: Recent Labs  Lab 10/21/21 1157 10/22/21 0456 10/23/21 0556  NA 124* 127* 126*  K 5.6* 4.8 5.1  CL 88* 89* 88*  CO2 22 25 26   GLUCOSE 122* 150* 81  BUN 73* 39* 50*  CREATININE 7.48* 5.20* 6.27*  CALCIUM 8.6* 8.9 9.3  PHOS 7.5* 5.9* 6.9*   Liver Function Tests: Recent Labs  Lab 10/21/21 1157 10/22/21 0456 10/23/21 0556  ALBUMIN 2.9* 2.9* 3.1*   No results for input(s): "LIPASE", "AMYLASE" in the last 168 hours. No results for input(s): "AMMONIA" in the last 168 hours. CBC: Recent Labs  Lab 10/21/21 0438 10/21/21 1156 10/22/21 0456 10/23/21 0556 10/25/21 0800  WBC 7.5 6.9 6.6 6.4 6.5  NEUTROABS  --   --   --  3.0  --   HGB 11.9* 11.6* 11.9* 12.0 12.2  HCT 38.2 37.1 37.0 37.2 37.5  MCV 89.9 90.9 88.9 86.9 87.6  PLT 211 219 172 194 197   Cardiac Enzymes: No results for input(s): "CKTOTAL", "CKMB", "CKMBINDEX", "TROPONINI" in the last 168 hours. CBG: Recent Labs  Lab 10/23/21 2103 10/24/21 0725 10/24/21 1122 10/24/21 1655 10/24/21 2031  GLUCAP 170* 127* 162* 120* 170*    Studies/Results:  No results found. Medications:  sodium chloride     sodium chloride 10 mL/hr at 09/19/21 1310   methocarbamol (ROBAXIN) IV      alosetron  1 mg Oral BID   amitriptyline  25 mg Oral QHS   amLODipine  10 mg Oral Q24H   atorvastatin  40 mg Oral q1800   carvedilol  6.25 mg Oral BID   Chlorhexidine Gluconate Cloth  6 each Topical Q0600   cyclobenzaprine  10 mg Oral TID   docusate sodium  100 mg Oral BID   DULoxetine  60 mg Oral Daily   famotidine  20 mg Oral Daily   furosemide  80 mg Oral Daily   heparin  5,000 Units Subcutaneous Q8H   hydrALAZINE  25 mg Oral TID   insulin aspart  0-6 Units Subcutaneous TID WC   insulin aspart  5 Units Subcutaneous TID WC   insulin glargine-yfgn  12 Units Subcutaneous QHS   multivitamin  1 tablet Oral QHS   pantoprazole  80 mg Oral Daily   sevelamer carbonate  2,400 mg Oral TID WC    sodium chloride flush  3 mL Intravenous Q12H

## 2021-10-25 NOTE — Progress Notes (Signed)
Received patient in bed to unit.  Alert and oriented.  Informed consent signed and in chart.   Treatment initiated: 0755 Treatment completed: 1112  Patient tolerated well.  Transported back to the room  Alert, without acute distress.  Hand-off given to patient's nurse.   Access used: fistula Access issues: none  Total UF removed: 4000 Medication(s) given: heparin bolus Post HD VS: 98.2, 134/69 (84), HR-71, RR-14, SP02-97 Post HD weight: 88.2kg   [facility]

## 2021-10-25 NOTE — Progress Notes (Signed)
PROGRESS NOTE [name]  ZOX:[id number] DOB: [birth year] DOA: 08/26/2021 PCP: [name], MD   Brief Narrative/Hospital Course: 56 year old woman PMH including ESRD, anemia of CKD, uncontrolled type 2 diabetes, hyperlipidemia, history of CVA, anxiety/depression, GERD, tobacco dependence complicated left hip and femur issues requiring multiple surgeries, removal of hardware presented 6/26 with bloody drainage from surgical incision.  Seen by orthopedics and underwent I&D 6/28 and 7/1.  Currently unable to progress care secondary to refusal to sit in chair for HD.    Subjective: Patient seen and this morning in dialysis. On bed for HD Reports she sat up on the chair an hour but could not do longer, but she says she is working on it Overnight blood sugars are stable BP controlled 130s to 150s, on room air.   Assessment and Plan: 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  Active Problems:   ESRD (end stage renal disease) on dialysis (HCC)   Type 2 diabetes mellitus with hyperlipidemia (HCC)   History of CVA (cerebrovascular accident)   Essential hypertension   Anxiety and depression   GASTROESOPHAGEAL REFLUX, NO ESOPHAGITIS   TOBACCO DEPENDENCE   Wound infection   Wound dehiscence, surgical, sequela  Subacute osteomyelitis left femur with abscess Infected left hip hardware s/p removal Left intertrochanteric fracture: Seen by orthopedics, underwent left hip debridement, wound VAC placement Dr. [name] (08/28/2021)-VAC removed and now on wound care ortho has cleared the patient for discharge.  Patient completed 6 weeks of daptomycin on 8/12/202.Patient seen later on during the hospitalization by Dr. [name] who recommended lymphedema pump as outpatient,pump cannot be arranged in-house. Generalized edema improving with dialysis.Outpatient follow-up with orthopedics, ID, nephrology.  ESRD on HD  MWF- per nephro. She needs to be able to sit in the chair to tolerate  outpatient dialysis prior to discharge.  AV fistula revision and plans ligation done by vascular.  Continue lasix. On HD today am  Anasarca continue HD and Lasix. Volume overload improved on dialysis.TTE 10/09/2021 with EF of 60 to 65%, [facility].Net IO Since Admission: -53,070.61 mL [10/25/21 1108]  Hyponatremia: Continue per nephrology with HD.  Monitor Recent Labs  Lab 10/21/21 0438 10/21/21 1157 10/22/21 0456 10/23/21 0556 10/25/21 0800  NA 127* 124* 127* 126* 124*  Hyperkalemia resolved on HD.  Monitor Recent Labs  Lab 10/21/21 0438 10/21/21 1157 10/22/21 0456 10/23/21 0556 10/25/21 0800  K 5.6* 5.6* 4.8 5.1 5.0  Anemia of chronic renal disease/CKD with acute blood loss anemia complaint from wound:Hemoglobin stable.  Monitor, Aranesp per nephrology. improved Recent Labs  Lab 10/21/21 0438 10/21/21 1156 10/22/21 0456 10/23/21 0556 10/25/21 0800  HGB 11.9* 11.6* 11.9* 12.0 12.2  HCT 38.2 37.1 37.0 37.2 37.5    Type 2 diabetes mellitus with uncontrolled hyperglycemia A1c 7.1, blood sugar stable continue Semglee 12 units bedtime and 5 units NovoLog Premeal and NovoLog SSI Recent Labs  Lab 10/23/21 2103 10/24/21 0725 10/24/21 1122 10/24/21 1655 10/24/21 2031  GLUCAP 170* 127* 162* 120* 170*  Hypertension: Blood pressure controlled on  Coreg, Norvasc, Lasix, hydralazine.hydralazine dose has been decreased to 25 mg 3 times daily on 8/17.  Monitor.  Hyperlipidemia/History of WJX:BJYNWGNF statin.  Depression/anxiety Stable.cont her Elavil/Cymbalta/Flexeril GERD: cont Pepcid, PPI. Tobacco dependence:tobacco cessation.  DVT prophylaxis: Place TED hose Start: 09/06/21 1507 SCDs Start: 08/31/21 1058 heparin injection 5,000 Units Start: 08/26/21 1615 Code Status:   Code Status: Full Code Family Communication: plan of care discussed with patient at bedside. Patient status is: inpatient  because  of ongoing need for inpatient dialysis, will need to be able to sit on chair for  dialysis, pending placement to SNF Level of care: Med-Surg   Dispo: The patient is from: home            Anticipated disposition: SNF tbd  Consultants:  Orthopedics: Dr. [name] 08/27/2021 ID: Dr. Gale Journey 08/27/2021 Nephrology: Dr.Schertz 08/27/2021 Vascular surgery: Dr. Unk Lightning 09/06/2021     Procedures:  CT left hip 08/26/2021 Plain films of the right knee 08/26/2021 Plain films of the right foot 08/26/2021 2D echo 10/09/2021 Vascular ultrasound duplex dialysis access 09/07/2021 Fistula revision branch ligation per Dr. Unk Lightning 09/19/2021- Left hip debridement, application of wound VAC by Dr. [name] 08/28/2021  Mobility Assessment (last 72 hours)     Mobility Assessment     Row Name 10/24/21 2050 10/24/21 1500 10/24/21 1438 10/24/21 1100 10/23/21 1330   Does patient have an order for bedrest or is patient medically unstable No - Continue assessment -- -- No - Continue assessment No - Continue assessment   What is the highest level of mobility based on the progressive mobility assessment? Level 2 (Chairfast) - Balance while sitting on edge of bed and cannot stand Level 2 (Chairfast) - Balance while sitting on edge of bed and cannot stand Level 3 (Stands with assist) - Balance while standing  and cannot march in place Level 3 (Stands with assist) - Balance while standing  and cannot march in place Level 3 (Stands with assist) - Balance while standing  and cannot march in place   Is the above level different from baseline mobility prior to current illness? Yes - Recommend PT order -- -- Yes - Recommend PT order Yes - Recommend PT order    [place] Name 10/22/21 2030 10/22/21 1200 10/22/21 1121       Does patient have an order for bedrest or is patient medically unstable No - Continue assessment -- --     What is the highest level of mobility based on the progressive mobility assessment? Level 3 (Stands with assist) - Balance while standing  and cannot march in place Level 3 (Stands with assist) - Balance while  standing  and cannot march in place Level 3 (Stands with assist) - Balance while standing  and cannot march in place     Is the above level different from baseline mobility prior to current illness? Yes - Recommend PT order -- --               Objective: Vitals last 24 hrs: Vitals:   10/25/21 0930 10/25/21 1000 10/25/21 1030 10/25/21 1100  BP: (!) 145/72 128/72 126/76 104/67  Pulse: 66 68 69 70  Resp: 11 13 17 11   Temp:      TempSrc:      SpO2: 97% 100% 100% 98%  Weight:      Height:       Weight change:   Physical Examination: General exam: AA,older than stated age, weak appearing. HEENT:Oral mucosa moist, Ear/Nose WNL grossly, dentition normal. Respiratory system: bilaterally diminished,no use of accessory muscle Cardiovascular system: S1 & S2 +, No JVD,. Gastrointestinal system: Abdomen soft,NT,ND, BS+ Nervous System:Alert, awake, moving extremities and grossly nonfocal Extremities: lt bka status, edema neg,distal peripheral pulses palpable.  Skin: No rashes,no icterus. MSK: Normal muscle bulk,tone, power LUE w/ AV fistula+  Medications reviewed:  Scheduled Meds:  alosetron  1 mg Oral BID   amitriptyline  25 mg Oral QHS   amLODipine  10 mg Oral Q24H  atorvastatin  40 mg Oral q1800   carvedilol  6.25 mg Oral BID   Chlorhexidine Gluconate Cloth  6 each Topical Q0600   cyclobenzaprine  10 mg Oral TID   docusate sodium  100 mg Oral BID   DULoxetine  60 mg Oral Daily   famotidine  20 mg Oral Daily   furosemide  80 mg Oral Daily   heparin  5,000 Units Subcutaneous Q8H   hydrALAZINE  25 mg Oral TID   insulin aspart  0-6 Units Subcutaneous TID WC   insulin aspart  5 Units Subcutaneous TID WC   insulin glargine-yfgn  12 Units Subcutaneous QHS   multivitamin  1 tablet Oral QHS   pantoprazole  80 mg Oral Daily   sevelamer carbonate  2,400 mg Oral TID WC   sodium chloride flush  3 mL Intravenous Q12H   Continuous Infusions:  sodium chloride     sodium chloride 10  mL/hr at 09/19/21 1310   methocarbamol (ROBAXIN) IV      Diet Order             Diet renal with fluid restriction Fluid restriction: 1200 mL Fluid; Room service appropriate? Yes; Fluid consistency: Thin  Diet effective now                   Intake/Output Summary (Last 24 hours) at 10/25/2021 1108 Last data filed at 10/25/2021 0600 Gross per 24 hour  Intake 120 ml  Output 275 ml  Net -155 ml   Net IO Since Admission: -53,070.61 mL [10/25/21 1108]  Wt Readings from Last 3 Encounters:  10/25/21 91.8 kg  08/23/21 96.5 kg  08/07/21 101.1 kg     Unresulted Labs (From admission, onward)     Start     Ordered   08/28/21 0917  MIC Result  Once,   R        08/28/21 0917          Data Reviewed: I have personally reviewed following labs and imaging studies CBC: Recent Labs  Lab 10/21/21 0438 10/21/21 1156 10/22/21 0456 10/23/21 0556 10/25/21 0800  WBC 7.5 6.9 6.6 6.4 6.5  NEUTROABS  --   --   --  3.0  --   HGB 11.9* 11.6* 11.9* 12.0 12.2  HCT 38.2 37.1 37.0 37.2 37.5  MCV 89.9 90.9 88.9 86.9 87.6  PLT 211 219 172 194 101   Basic Metabolic Panel: Recent Labs  Lab 10/21/21 0438 10/21/21 1157 10/22/21 0456 10/23/21 0556 10/25/21 0800  NA 127* 124* 127* 126* 124*  K 5.6* 5.6* 4.8 5.1 5.0  CL 92* 88* 89* 88* 91*  CO2 24 22 25 26 25   GLUCOSE 94 122* 150* 81 125*  BUN 71* 73* 39* 50* 50*  CREATININE 7.33* 7.48* 5.20* 6.27* 5.63*  CALCIUM 8.6* 8.6* 8.9 9.3 8.7*  PHOS 7.7* 7.5* 5.9* 6.9* 6.3*   GFR: Estimated Creatinine Clearance: 14 mL/min (A) (by C-G formula based on SCr of 5.63 mg/dL (H)). Liver Function Tests: Recent Labs  Lab 10/21/21 0438 10/21/21 1157 10/22/21 0456 10/23/21 0556 10/25/21 0800  ALBUMIN 2.8* 2.9* 2.9* 3.1* 2.9*   No results for input(s): "LIPASE", "AMYLASE" in the last 168 hours. No results for input(s): "AMMONIA" in the last 168 hours. Coagulation Profile: No results for input(s): "INR", "PROTIME" in the last 168 hours. BNP  (last 3 results) No results for input(s): "PROBNP" in the last 8760 hours. HbA1C: No results for input(s): "HGBA1C" in the last 72 hours. CBG:  Recent Labs  Lab 10/23/21 2103 10/24/21 0725 10/24/21 1122 10/24/21 1655 10/24/21 2031  GLUCAP 170* 127* 162* 120* 170*    No results found for this or any previous visit (from the past 240 hour(s)).  Antimicrobials: Anti-infectives (From admission, onward)    Start     Dose/Rate Route Frequency Ordered Stop   10/02/21 2000  DAPTOmycin (CUBICIN) 900 mg in sodium chloride 0.9 % IVPB  Status:  Discontinued        10 mg/kg  92.4 kg 136 mL/hr over 30 Minutes Intravenous Once per day on Mon Wed Fri 10/01/21 1350 10/12/21 1146   09/30/21 2000  DAPTOmycin (CUBICIN) 900 mg in sodium chloride 0.9 % IVPB  Status:  Discontinued        10 mg/kg  92.4 kg 136 mL/hr over 30 Minutes Intravenous Daily 09/28/21 0919 10/01/21 1350   09/28/21 2000  DAPTOmycin (CUBICIN) 900 mg in sodium chloride 0.9 % IVPB        900 mg 136 mL/hr over 30 Minutes Intravenous  Once 09/28/21 0919 09/28/21 2146   09/02/21 2000  DAPTOmycin (CUBICIN) 900 mg in sodium chloride 0.9 % IVPB  Status:  Discontinued        10 mg/kg  92.4 kg 136 mL/hr over 30 Minutes Intravenous Once per day on Mon Wed Fri 09/02/21 0836 09/28/21 0919   08/31/21 0745  ceFAZolin (ANCEF) IVPB 2g/100 mL premix        2 g 200 mL/hr over 30 Minutes Intravenous To Short Stay 08/31/21 0730 08/31/21 0918   08/30/21 0600  ceFAZolin (ANCEF) IVPB 2g/100 mL premix  Status:  Discontinued        2 g 200 mL/hr over 30 Minutes Intravenous To Short Stay 08/30/21 0123 08/31/21 0600   08/29/21 2000  DAPTOmycin (CUBICIN) 900 mg in sodium chloride 0.9 % IVPB  Status:  Discontinued        10 mg/kg  92.4 kg 136 mL/hr over 30 Minutes Intravenous Every 48 hours 08/29/21 1627 09/02/21 0836   08/28/21 1800  ceFAZolin (ANCEF) IVPB 2g/100 mL premix  Status:  Discontinued        2 g 200 mL/hr over 30 Minutes Intravenous Every  M-W-F (1800) 08/27/21 0846 08/29/21 1620   08/28/21 1200  ceFAZolin (ANCEF) IVPB 1 g/50 mL premix  Status:  Discontinued        1 g 100 mL/hr over 30 Minutes Intravenous Every M-W-F (Hemodialysis) 08/26/21 1954 08/27/21 0846   08/28/21 0600  ceFAZolin (ANCEF) IVPB 2g/100 mL premix        2 g 200 mL/hr over 30 Minutes Intravenous On call to O.R. 08/27/21 1150 08/28/21 0909   08/27/21 1800  ceFAZolin (ANCEF) IVPB 1 g/50 mL premix        1 g 100 mL/hr over 30 Minutes Intravenous  Once 08/26/21 1954 08/27/21 2048   08/26/21 1500  ceFEPIme (MAXIPIME) 1 g in sodium chloride 0.9 % 100 mL IVPB  Status:  Discontinued        1 g 200 mL/hr over 30 Minutes Intravenous Every 24 hours 08/26/21 1452 08/26/21 1714      Culture/Microbiology    Component Value Date/Time   SDES TISSUE 08/28/2021 [place] TISSUE 08/28/2021 0917   CULT  08/28/2021 0917    MODERATE ENTEROCOCCUS FAECIUM VANCOMYCIN RESISTANT ENTEROCOCCUS ISOLATED NO ANAEROBES ISOLATED Sent to [facility] for further susceptibility testing. Performed at [facility], [place] [address] [place], [place] [address]    REPTSTATUS  09/02/2021 FINAL 08/28/2021 0917    Other culture-see note  Radiology Studies: No results found.   LOS: 60 days   Antonieta Pert, MD [facility]  10/25/2021, 11:08 AM

## 2021-10-26 DIAGNOSIS — M86252 Subacute osteomyelitis, left femur: Secondary | ICD-10-CM | POA: Diagnosis not present

## 2021-10-26 LAB — GLUCOSE, CAPILLARY
Glucose-Capillary: 108 mg/dL — ABNORMAL HIGH (ref 70–99)
Glucose-Capillary: 97 mg/dL (ref 70–99)
Glucose-Capillary: 154 mg/dL — ABNORMAL HIGH (ref 70–99)
Glucose-Capillary: 69 mg/dL — ABNORMAL LOW (ref 70–99)
Glucose-Capillary: 85 mg/dL (ref 70–99)

## 2021-10-26 NOTE — Progress Notes (Signed)
PROGRESS NOTE [name]  WSF:[id number] DOB: 1966/02/06 DOA: 08/26/2021 PCP: [name], MD   Brief Narrative/Hospital Course: 56 year old woman PMH including ESRD, anemia of CKD, uncontrolled type 2 diabetes, hyperlipidemia, history of CVA, anxiety/depression, GERD, tobacco dependence complicated left hip and femur issues requiring multiple surgeries, removal of hardware presented 6/26 with bloody drainage from surgical incision.  Seen by orthopedics and underwent I&D 6/28 and 7/1.  Currently unable to progress care secondary to refusal to sit in chair for HD.    Subjective:  Seen and examined Resting well No new complaints Overnight no fever.  Assessment and Plan: 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  Active Problems:   ESRD (end stage renal disease) on dialysis (HCC)   Type 2 diabetes mellitus with hyperlipidemia (HCC)   History of CVA (cerebrovascular accident)   Essential hypertension   Anxiety and depression   GASTROESOPHAGEAL REFLUX, NO ESOPHAGITIS   TOBACCO DEPENDENCE   Wound infection   Wound dehiscence, surgical, sequela  Subacute osteomyelitis left femur with abscess Infected left hip hardware s/p removal Left intertrochanteric fracture: Seen by orthopedics, underwent left hip debridement, wound VAC placement Dr. [name] (08/28/2021)-VAC removed and now on wound care ortho has cleared the patient for discharge.  Patient completed 6 weeks of daptomycin on 8/12/202.Patient seen later on during the hospitalization by Dr. [name] who recommended lymphedema pump as outpatient,pump cannot be arranged in-house. Generalized edema improving with dialysis.Outpatient follow-up with orthopedics, ID, nephrology.  ESRD on HD  MWF- per nephro. She needs to be able to sit in the chair to tolerate outpatient dialysis prior to discharge.  AV fistula revision and plans ligation done by vascular.  Continue lasix.  Last HD 10/25/21.   Anasarca continue HD and  Lasix. Volume overload improved on dialysis.TTE 10/09/2021 with EF of 60 to 65%, NW[facility].Net IO Since Admission: -56,790.61 mL [10/26/21 1010]  Hyponatremia: Continue per nephrology with HD.  Monitor Recent Labs  Lab 10/21/21 0438 10/21/21 1157 10/22/21 0456 10/23/21 0556 10/25/21 0800  NA 127* 124* 127* 126* 124*  Hyperkalemia resolved on HD.  K on higher side of normal. Recent Labs  Lab 10/21/21 0438 10/21/21 1157 10/22/21 0456 10/23/21 0556 10/25/21 0800  K 5.6* 5.6* 4.8 5.1 5.0  Anemia of chronic renal disease/CKD with acute blood loss anemia from blood loss in wound: Hemoglobin stable.  Monitor intermittently.Aranesp per nephrology. Recent Labs  Lab 10/21/21 0438 10/21/21 1156 10/22/21 0456 10/23/21 0556 10/25/21 0800  HGB 11.9* 11.6* 11.9* 12.0 12.2  HCT 38.2 37.1 37.0 37.2 37.5    Type 2 diabetes mellitus with uncontrolled hyperglycemia A1c 7.1, blood sugar stable continue Semglee 12 units bedtime and 5 units NovoLog Premeal and NovoLog SSI Recent Labs  Lab 10/24/21 2031 10/25/21 1232 10/25/21 1659 10/25/21 2123 10/26/21 0717  GLUCAP 170* 167* 191* 131* 108*  Hypertension: Blood pressure controlled on  Coreg, Norvasc, Lasix, hydralazine.hydralazine dose has been decreased to 25 mg 3 times daily on 8/17.  Monitor.  Hyperlipidemia/History of YFV:CBSWHQPR statin.  Depression/anxiety Stable.cont her Elavil/Cymbalta/Flexeril GERD: cont Pepcid, PPI. Tobacco dependence:tobacco cessation.  DVT prophylaxis: Place TED hose Start: 09/06/21 1507 SCDs Start: 08/31/21 1058 heparin injection 5,000 Units Start: 08/26/21 1615 Code Status:   Code Status: Full Code Family Communication: plan of care discussed with patient at bedside. Patient status is: inpatient  because of ongoing need for inpatient dialysis, will need to be able to sit on chair for dialysis, pending placement to SNF Level of care: Med-Surg  Dispo: The patient is from: home            Anticipated disposition:  SNF tbd  Consultants:  Orthopedics: Dr. [name] 08/27/2021 ID: Dr. Gale Journey 08/27/2021 Nephrology: Dr.Schertz 08/27/2021 Vascular surgery: Dr. Unk Lightning 09/06/2021     Procedures:  CT left hip 08/26/2021 Plain films of the right knee 08/26/2021 Plain films of the right foot 08/26/2021 2D echo 10/09/2021 Vascular ultrasound duplex dialysis access 09/07/2021 Fistula revision branch ligation per Dr. Unk Lightning 09/19/2021- Left hip debridement, application of wound VAC by Dr. [name] 08/28/2021  Mobility Assessment (last 72 hours)     Mobility Assessment     Row Name 10/25/21 0930 10/24/21 2050 10/24/21 1500 10/24/21 1438 10/24/21 1100   Does patient have an order for bedrest or is patient medically unstable No - Continue assessment No - Continue assessment -- -- No - Continue assessment   What is the highest level of mobility based on the progressive mobility assessment? Level 2 (Chairfast) - Balance while sitting on edge of bed and cannot stand Level 2 (Chairfast) - Balance while sitting on edge of bed and cannot stand Level 2 (Chairfast) - Balance while sitting on edge of bed and cannot stand Level 3 (Stands with assist) - Balance while standing  and cannot march in place Level 3 (Stands with assist) - Balance while standing  and cannot march in place   Is the above level different from baseline mobility prior to current illness? Yes - Recommend PT order Yes - Recommend PT order -- -- Yes - Recommend PT order    [place] Name 10/23/21 1330           Does patient have an order for bedrest or is patient medically unstable No - Continue assessment       What is the highest level of mobility based on the progressive mobility assessment? Level 3 (Stands with assist) - Balance while standing  and cannot march in place       Is the above level different from baseline mobility prior to current illness? Yes - Recommend PT order                 Objective: Vitals last 24 hrs: Vitals:   10/25/21 1655 10/25/21 2135  10/26/21 0600 10/26/21 0902  BP: (!) 116/53 117/66 (!) 148/62 128/68  Pulse: 65 62 61 61  Resp: 17 17 18 18   Temp: 98.9 F (37.2 C) 98.4 F (36.9 C) 97.8 F (36.6 C) 98.4 F (36.9 C)  TempSrc:  Oral Oral   SpO2: 97% 97% 96% 94%  Weight:      Height:       Weight change:   Physical Examination: General exam: AA, older than stated age, weak appearing. HEENT:Oral mucosa moist, Ear/Nose WNL grossly, dentition normal. Respiratory system: bilaterally diminished, no use of accessory muscle Cardiovascular system: S1 & S2 +, No JVD,. Gastrointestinal system: Abdomen soft,NT,ND,BS+ Nervous System:Alert, awake, moving extremities and grossly nonfocal Extremities: lt BKA, LE ankle edema NEG, distal peripheral pulses palpable.  Skin: No rashes,no icterus. MSK: Normal muscle bulk,tone, power   Medications reviewed:  Scheduled Meds:  alosetron  1 mg Oral BID   amitriptyline  25 mg Oral QHS   amLODipine  10 mg Oral Q24H   atorvastatin  40 mg Oral q1800   carvedilol  6.25 mg Oral BID   Chlorhexidine Gluconate Cloth  6 each Topical Q0600   cyclobenzaprine  10 mg Oral TID   docusate sodium  100 mg Oral BID  DULoxetine  60 mg Oral Daily   famotidine  20 mg Oral Daily   furosemide  80 mg Oral Daily   heparin  5,000 Units Subcutaneous Q8H   hydrALAZINE  25 mg Oral TID   insulin aspart  0-6 Units Subcutaneous TID WC   insulin aspart  5 Units Subcutaneous TID WC   insulin glargine-yfgn  12 Units Subcutaneous QHS   multivitamin  1 tablet Oral QHS   pantoprazole  80 mg Oral Daily   sevelamer carbonate  2,400 mg Oral TID WC   sodium chloride flush  3 mL Intravenous Q12H   Continuous Infusions:  sodium chloride     sodium chloride 10 mL/hr at 09/19/21 1310   methocarbamol (ROBAXIN) IV      Diet Order             Diet renal with fluid restriction Fluid restriction: 1200 mL Fluid; Room service appropriate? Yes; Fluid consistency: Thin  Diet effective now                    Intake/Output Summary (Last 24 hours) at 10/26/2021 1010 Last data filed at 10/26/2021 0600 Gross per 24 hour  Intake 460 ml  Output 4300 ml  Net -3840 ml   Net IO Since Admission: -56,790.61 mL [10/26/21 1010]  Wt Readings from Last 3 Encounters:  10/25/21 88.2 kg  08/23/21 96.5 kg  08/07/21 101.1 kg     Unresulted Labs (From admission, onward)     Start     Ordered   08/28/21 0917  MIC Result  Once,   R        08/28/21 0917          Data Reviewed: I have personally reviewed following labs and imaging studies CBC: Recent Labs  Lab 10/21/21 0438 10/21/21 1156 10/22/21 0456 10/23/21 0556 10/25/21 0800  WBC 7.5 6.9 6.6 6.4 6.5  NEUTROABS  --   --   --  3.0  --   HGB 11.9* 11.6* 11.9* 12.0 12.2  HCT 38.2 37.1 37.0 37.2 37.5  MCV 89.9 90.9 88.9 86.9 87.6  PLT 211 219 172 194 294   Basic Metabolic Panel: Recent Labs  Lab 10/21/21 0438 10/21/21 1157 10/22/21 0456 10/23/21 0556 10/25/21 0800  NA 127* 124* 127* 126* 124*  K 5.6* 5.6* 4.8 5.1 5.0  CL 92* 88* 89* 88* 91*  CO2 24 22 25 26 25   GLUCOSE 94 122* 150* 81 125*  BUN 71* 73* 39* 50* 50*  CREATININE 7.33* 7.48* 5.20* 6.27* 5.63*  CALCIUM 8.6* 8.6* 8.9 9.3 8.7*  PHOS 7.7* 7.5* 5.9* 6.9* 6.3*   GFR: Estimated Creatinine Clearance: 13.8 mL/min (A) (by C-G formula based on SCr of 5.63 mg/dL (H)). Liver Function Tests: Recent Labs  Lab 10/21/21 0438 10/21/21 1157 10/22/21 0456 10/23/21 0556 10/25/21 0800  ALBUMIN 2.8* 2.9* 2.9* 3.1* 2.9*   No results for input(s): "LIPASE", "AMYLASE" in the last 168 hours. No results for input(s): "AMMONIA" in the last 168 hours. Coagulation Profile: No results for input(s): "INR", "PROTIME" in the last 168 hours. BNP (last 3 results) No results for input(s): "PROBNP" in the last 8760 hours. HbA1C: No results for input(s): "HGBA1C" in the last 72 hours. CBG: Recent Labs  Lab 10/24/21 2031 10/25/21 1232 10/25/21 1659 10/25/21 2123 10/26/21 0717  GLUCAP  170* 167* 191* 131* 108*    No results found for this or any previous visit (from the past 240 hour(s)).  Antimicrobials: Anti-infectives (From admission,  onward)    Start     Dose/Rate Route Frequency Ordered Stop   10/02/21 2000  DAPTOmycin (CUBICIN) 900 mg in sodium chloride 0.9 % IVPB  Status:  Discontinued        10 mg/kg  92.4 kg 136 mL/hr over 30 Minutes Intravenous Once per day on Mon Wed Fri 10/01/21 1350 10/12/21 1146   09/30/21 2000  DAPTOmycin (CUBICIN) 900 mg in sodium chloride 0.9 % IVPB  Status:  Discontinued        10 mg/kg  92.4 kg 136 mL/hr over 30 Minutes Intravenous Daily 09/28/21 0919 10/01/21 1350   09/28/21 2000  DAPTOmycin (CUBICIN) 900 mg in sodium chloride 0.9 % IVPB        900 mg 136 mL/hr over 30 Minutes Intravenous  Once 09/28/21 0919 09/28/21 2146   09/02/21 2000  DAPTOmycin (CUBICIN) 900 mg in sodium chloride 0.9 % IVPB  Status:  Discontinued        10 mg/kg  92.4 kg 136 mL/hr over 30 Minutes Intravenous Once per day on Mon Wed Fri 09/02/21 0836 09/28/21 0919   08/31/21 0745  ceFAZolin (ANCEF) IVPB 2g/100 mL premix        2 g 200 mL/hr over 30 Minutes Intravenous To Short Stay 08/31/21 0730 08/31/21 0918   08/30/21 0600  ceFAZolin (ANCEF) IVPB 2g/100 mL premix  Status:  Discontinued        2 g 200 mL/hr over 30 Minutes Intravenous To Short Stay 08/30/21 0123 08/31/21 0600   08/29/21 2000  DAPTOmycin (CUBICIN) 900 mg in sodium chloride 0.9 % IVPB  Status:  Discontinued        10 mg/kg  92.4 kg 136 mL/hr over 30 Minutes Intravenous Every 48 hours 08/29/21 1627 09/02/21 0836   08/28/21 1800  ceFAZolin (ANCEF) IVPB 2g/100 mL premix  Status:  Discontinued        2 g 200 mL/hr over 30 Minutes Intravenous Every M-W-F (1800) 08/27/21 0846 08/29/21 1620   08/28/21 1200  ceFAZolin (ANCEF) IVPB 1 g/50 mL premix  Status:  Discontinued        1 g 100 mL/hr over 30 Minutes Intravenous Every M-W-F (Hemodialysis) 08/26/21 1954 08/27/21 0846   08/28/21 0600   ceFAZolin (ANCEF) IVPB 2g/100 mL premix        2 g 200 mL/hr over 30 Minutes Intravenous On call to O.R. 08/27/21 1150 08/28/21 0909   08/27/21 1800  ceFAZolin (ANCEF) IVPB 1 g/50 mL premix        1 g 100 mL/hr over 30 Minutes Intravenous  Once 08/26/21 1954 08/27/21 2048   08/26/21 1500  ceFEPIme (MAXIPIME) 1 g in sodium chloride 0.9 % 100 mL IVPB  Status:  Discontinued        1 g 200 mL/hr over 30 Minutes Intravenous Every 24 hours 08/26/21 1452 08/26/21 1714      Culture/Microbiology    Component Value Date/Time   SDES TISSUE 08/28/2021 [place] TISSUE 08/28/2021 0917   CULT  08/28/2021 0917    MODERATE ENTEROCOCCUS FAECIUM VANCOMYCIN RESISTANT ENTEROCOCCUS ISOLATED NO ANAEROBES ISOLATED Sent to [facility] for further susceptibility testing. Performed at [facility], [place] [address], [place], [place] [address]    REPTSTATUS 09/02/2021 FINAL 08/28/2021 3536    Other culture-see note  Radiology Studies: No results found.   LOS: 91 days   Antonieta Pert, MD [facility]  10/26/2021, 10:10 AM

## 2021-10-26 NOTE — Progress Notes (Signed)
Subjective: Seen in room no complaints said tolerated 4 L UF dialysis yesterday but not in recliner.  States not sure if she can try Monday either because of hip discomfort  Objective Vital signs in last 24 hours: Vitals:   10/25/21 1655 10/25/21 2135 10/26/21 0600 10/26/21 0902  BP: (!) 116/53 117/66 (!) 148/62 128/68  Pulse: 65 62 61 61  Resp: 17 17 18 18   Temp: 98.9 F (37.2 C) 98.4 F (36.9 C) 97.8 F (36.6 C) 98.4 F (36.9 C)  TempSrc:  Oral Oral   SpO2: 97% 97% 96% 94%  Weight:      Height:       Weight change:  Physical Exam: General: Alert adult female NAD Heart: RRR, no mrg Lungs: CTA  Ant  Abdomen: NABS , S, NT, ND  Extremities: LBKA trace edema, no RLE  edema  Dialysis Access: LUA AVF positive bruit     Dialysis Orders: MWF at [facility], 400/500, EDW 90kg, 2K/2Ca, LUE AVF, heparin 4000 unit bolus   Problem/Plan: 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 Back to OR 08/28/21, 08/31/21 for L hip debrid. and wound vac placement (now removed). Finished 6 week course of IV Daptomycin on 10/12/21. 2. ESRD: MWF schedule. Intermittent AVF cannulation issues ,now ok./continue to encourage to sit in chair for HD.  3. AVF Access: S/p branch ligation in OR 09/19/2021 per Dr. Virl Cagey.  4. HTN/ volume: BP controlled. On amlodipine/hydralazine/Coreg. May be able to taper off meds with 4 L uf on hd yesterday systolics in the teens.  This a.m. 128/68..Still has edema but improved. Max UF as tolerated and has been reminded of importance of following fluid and sodium restrictions.  5. Anemia of ESRD: Hgb 12.2 Not on ESA.  6. Secondary HPTH: Cca controlled. Phos elevated 6. 3 yest . Continue Renvela as binder-increased dose. No VDRA for now. 7. DM2: Insulin per primary 8. Dispo: SNF recommended.  Yesterday told me refused  Cannot tolerate sitting for prolonged periods and not willing to try HD in a chair, which she will need to be able to do outpatient. Per PT  notes" She says her goal is to be able to walk prior to discharge.  "   Ernest Haber, PA-C [facility] [phone] 10/26/2021,11:53 AM  LOS: 61 days   Labs: Basic Metabolic Panel: Recent Labs  Lab 10/22/21 0456 10/23/21 0556 10/25/21 0800  NA 127* 126* 124*  K 4.8 5.1 5.0  CL 89* 88* 91*  CO2 25 26 25   GLUCOSE 150* 81 125*  BUN 39* 50* 50*  CREATININE 5.20* 6.27* 5.63*  CALCIUM 8.9 9.3 8.7*  PHOS 5.9* 6.9* 6.3*   Liver Function Tests: Recent Labs  Lab 10/22/21 0456 10/23/21 0556 10/25/21 0800  ALBUMIN 2.9* 3.1* 2.9*   No results for input(s): "LIPASE", "AMYLASE" in the last 168 hours. No results for input(s): "AMMONIA" in the last 168 hours. CBC: Recent Labs  Lab 10/21/21 0438 10/21/21 1156 10/22/21 0456 10/23/21 0556 10/25/21 0800  WBC 7.5 6.9 6.6 6.4 6.5  NEUTROABS  --   --   --  3.0  --   HGB 11.9* 11.6* 11.9* 12.0 12.2  HCT 38.2 37.1 37.0 37.2 37.5  MCV 89.9 90.9 88.9 86.9 87.6  PLT 211 219 172 194 197   Cardiac Enzymes: No results for input(s): "CKTOTAL", "CKMB", "CKMBINDEX", "TROPONINI" in the last 168 hours. CBG: Recent Labs  Lab 10/25/21 1232 10/25/21 1659 10/25/21 2123 10/26/21 0717 10/26/21  [place]    Studies/Results: No results found. Medications:  sodium chloride     sodium chloride 10 mL/hr at 09/19/21 1310   methocarbamol (ROBAXIN) IV      alosetron  1 mg Oral BID   amitriptyline  25 mg Oral QHS   amLODipine  10 mg Oral Q24H   atorvastatin  40 mg Oral q1800   carvedilol  6.25 mg Oral BID   Chlorhexidine Gluconate Cloth  6 each Topical Q0600   cyclobenzaprine  10 mg Oral TID   docusate sodium  100 mg Oral BID   DULoxetine  60 mg Oral Daily   famotidine  20 mg Oral Daily   furosemide  80 mg Oral Daily   heparin  5,000 Units Subcutaneous Q8H   hydrALAZINE  25 mg Oral TID   insulin aspart  0-6 Units Subcutaneous TID WC   insulin aspart  5 Units Subcutaneous TID WC   insulin  glargine-yfgn  12 Units Subcutaneous QHS   multivitamin  1 tablet Oral QHS   pantoprazole  80 mg Oral Daily   sevelamer carbonate  2,400 mg Oral TID WC   sodium chloride flush  3 mL Intravenous Q12H

## 2021-10-27 DIAGNOSIS — M86252 Subacute osteomyelitis, left femur: Secondary | ICD-10-CM | POA: Diagnosis not present

## 2021-10-27 LAB — GLUCOSE, CAPILLARY
Glucose-Capillary: 133 mg/dL — ABNORMAL HIGH (ref 70–99)
Glucose-Capillary: 122 mg/dL — ABNORMAL HIGH (ref 70–99)
Glucose-Capillary: 153 mg/dL — ABNORMAL HIGH (ref 70–99)
Glucose-Capillary: 209 mg/dL — ABNORMAL HIGH (ref 70–99)

## 2021-10-27 MED ORDER — INSULIN GLARGINE-YFGN 100 UNIT/ML ~~LOC~~ SOLN
5.0000 [IU] | Freq: Every day | SUBCUTANEOUS | Status: DC
  Administered 2021-10-27 – 2021-11-18 (×23): 5 [IU] via SUBCUTANEOUS
  Filled 2021-10-27 (×25): qty 0.05

## 2021-10-27 MED ORDER — CHLORHEXIDINE GLUCONATE CLOTH 2 % EX PADS
6.0000 | MEDICATED_PAD | Freq: Every day | CUTANEOUS | Status: DC
  Administered 2021-10-28: 6 via TOPICAL

## 2021-10-27 MED ORDER — INSULIN ASPART 100 UNIT/ML IJ SOLN
3.0000 [IU] | Freq: Three times a day (TID) | INTRAMUSCULAR | Status: DC
Start: 2021-10-27 — End: 2021-11-16
  Administered 2021-10-27 – 2021-11-16 (×54): 3 [IU] via SUBCUTANEOUS

## 2021-10-27 NOTE — Progress Notes (Signed)
PROGRESS NOTE [name]  QMG:[id number] DOB: 1965-05-20 DOA: 08/26/2021 PCP: [name], MD   Brief Narrative/Hospital Course: 56 year old woman PMH including ESRD, anemia of CKD, uncontrolled type 2 diabetes, hyperlipidemia, history of CVA, anxiety/depression, GERD, tobacco dependence complicated left hip and femur issues requiring multiple surgeries, removal of hardware presented 6/26 with bloody drainage from surgical incision.  Seen by orthopedics and underwent I&D 6/28 and 7/1.  Currently unable to progress care secondary to refusal to sit in chair for HD.    Subjective: Seen and examined.  Resting comfortably some left hip pain Discussed extensively that she needs to have dialysis in the chair but she is worried that it may take up to 6 hours for her to complete her dialysis. Overnight blood sugar 69 around 9 PM Has been afebrile BP stable  Assessment and Plan: 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  Active Problems:   ESRD (end stage renal disease) on dialysis (HCC)   Type 2 diabetes mellitus with hyperlipidemia (HCC)   History of CVA (cerebrovascular accident)   Essential hypertension   Anxiety and depression   GASTROESOPHAGEAL REFLUX, NO ESOPHAGITIS   TOBACCO DEPENDENCE   Wound infection   Wound dehiscence, surgical, sequela  Subacute osteomyelitis left femur with abscess Infected left hip hardware s/p removal Left intertrochanteric fracture: Seen by orthopedics, underwent left hip debridement, wound VAC placement Dr. [name] (08/28/2021)-VAC removed and now on wound care ortho has cleared the patient for discharge.  Patient completed 6 weeks of daptomycin on 8/12/202.Patient seen later on during the hospitalization by Dr. [name] who recommended lymphedema pump as outpatient,pump cannot be arranged in-house. Generalized edema improving with dialysis.Outpatient follow-up with orthopedics, ID, nephrology.  Patient is worried about hip infection  coming back, not willing to try dialysis on chair  ESRD on HD  MWF- per nephro. She needs to be able to sit in the chair to tolerate outpatient dialysis prior to discharge.  AV fistula revision and plans ligation done by vascular.  Continue lasix.  Continue HD per nephrology  Anasarca continue HD and Lasix. Volume overload improved on dialysis.TTE 10/09/2021 with EF of 60 to 65%, NWMA.Net IO Since Admission: -32,671.24 mL [10/27/21 0737]  Hyponatremia: Continue per nephrology with HD.  Monitor Recent Labs  Lab 10/21/21 0438 10/21/21 1157 10/22/21 0456 10/23/21 0556 10/25/21 0800  NA 127* 124* 127* 126* 124*   Hyperkalemia resolved on HD.  K on higher side of normal. Recent Labs  Lab 10/21/21 0438 10/21/21 1157 10/22/21 0456 10/23/21 0556 10/25/21 0800  K 5.6* 5.6* 4.8 5.1 5.0   Anemia of chronic renal disease/CKD with acute blood loss anemia from blood loss in wound: Hemoglobin stable.  Monitor intermittently.Aranesp per nephrology. Recent Labs  Lab 10/21/21 0438 10/21/21 1156 10/22/21 0456 10/23/21 0556 10/25/21 0800  HGB 11.9* 11.6* 11.9* 12.0 12.2  HCT 38.2 37.1 37.0 37.2 37.5   Type 2 diabetes mellitus with uncontrolled hyperglycemia A1c 7.1, with hypoglycemia x1 overnight and semglee held- will decrease Semglee to  5 units bedtime and premeal insulin to 3 units and NovoLog SSI Recent Labs  Lab 10/26/21 1139 10/26/21 1634 10/26/21 2044 10/26/21 2129 10/27/21 0717  GLUCAP 97 154* 69* 85 133*   Hypertension: Well-controlled, continue Coreg, Norvasc, Lasix, hydralazine.hydralazine dose has been decreased to 25 mg 3 times daily on 8/17.  Monitor.  Hyperlipidemia/History of PYK:DXIPJASN statin.  Depression/anxiety Stable.cont her Elavil/Cymbalta/Flexeril GERD: cont Pepcid, PPI. Tobacco dependence:tobacco cessation.  DVT prophylaxis: Place TED hose Start: 09/06/21 1507  SCDs Start: 08/31/21 1058 heparin injection 5,000 Units Start: 08/26/21 1615 Code Status:   Code  Status: Full Code Family Communication: plan of care discussed with patient at bedside. Patient status is: inpatient  because of ongoing need for inpatient dialysis, will need to be able to sit on chair for dialysis, pending placement to SNF.  Patient reports she cannot tolerate sitting for prolonged periods and not willing to try HD in a chair  Level of care: Med-Surg   Dispo: The patient is from: home            Anticipated disposition: SNF tbd  Consultants:  Orthopedics: Dr. [name] 08/27/2021 ID: Dr. Gale Journey 08/27/2021 Nephrology: Dr.Schertz 08/27/2021 Vascular surgery: Dr. Unk Lightning 09/06/2021     Procedures:  CT left hip 08/26/2021 Plain films of the right knee 08/26/2021 Plain films of the right foot 08/26/2021 2D echo 10/09/2021 Vascular ultrasound duplex dialysis access 09/07/2021 Fistula revision branch ligation per Dr. Unk Lightning 09/19/2021- Left hip debridement, application of wound VAC by Dr. [name] 08/28/2021  Mobility Assessment (last 72 hours)     Mobility Assessment     Row Name 10/26/21 1948 10/25/21 0930 10/24/21 2050 10/24/21 1500 10/24/21 1438   Does patient have an order for bedrest or is patient medically unstable No - Continue assessment No - Continue assessment No - Continue assessment -- --   What is the highest level of mobility based on the progressive mobility assessment? Level 2 (Chairfast) - Balance while sitting on edge of bed and cannot stand Level 2 (Chairfast) - Balance while sitting on edge of bed and cannot stand Level 2 (Chairfast) - Balance while sitting on edge of bed and cannot stand Level 2 (Chairfast) - Balance while sitting on edge of bed and cannot stand Level 3 (Stands with assist) - Balance while standing  and cannot march in place   Is the above level different from baseline mobility prior to current illness? Yes - Recommend PT order Yes - Recommend PT order Yes - Recommend PT order -- --    [place] Name 10/24/21 1100           Does patient have an order for  bedrest or is patient medically unstable No - Continue assessment       What is the highest level of mobility based on the progressive mobility assessment? Level 3 (Stands with assist) - Balance while standing  and cannot march in place       Is the above level different from baseline mobility prior to current illness? Yes - Recommend PT order                 Objective: Vitals last 24 hrs: Vitals:   10/26/21 0902 10/26/21 1632 10/26/21 2044 10/27/21 0522  BP: 128/68 134/67 (!) 143/56 (!) 155/56  Pulse: 61 (!) 57 (!) 56 61  Resp: 18 17 18 16   Temp: 98.4 F (36.9 C) 98.3 F (36.8 C) 98.5 F (36.9 C) 98 F (36.7 C)  TempSrc:   Oral   SpO2: 94% 96% 99% 96%  Weight:      Height:       Weight change:   Physical Examination: General exam: AAox3,older than stated age, weak appearing. HEENT:Oral mucosa moist, Ear/Nose WNL grossly, dentition normal. Respiratory system: bilaterally clear,no use of accessory muscle Cardiovascular system: S1 & S2 +, No JVD,. Gastrointestinal system: Abdomen soft,NT,ND,BS+ Nervous System:Alert, awake, moving extremities and grossly nonfocal Extremities: lt BKA, LE ankle edema neg, distal peripheral pulses palpable.  Skin:  No rashes,no icterus. MSK: Normal muscle bulk,tone, power   Medications reviewed:  Scheduled Meds:  alosetron  1 mg Oral BID   amitriptyline  25 mg Oral QHS   amLODipine  10 mg Oral Q24H   atorvastatin  40 mg Oral q1800   carvedilol  6.25 mg Oral BID   Chlorhexidine Gluconate Cloth  6 each Topical Q0600   cyclobenzaprine  10 mg Oral TID   docusate sodium  100 mg Oral BID   DULoxetine  60 mg Oral Daily   famotidine  20 mg Oral Daily   furosemide  80 mg Oral Daily   heparin  5,000 Units Subcutaneous Q8H   hydrALAZINE  25 mg Oral TID   insulin aspart  0-6 Units Subcutaneous TID WC   insulin aspart  5 Units Subcutaneous TID WC   insulin glargine-yfgn  12 Units Subcutaneous QHS   multivitamin  1 tablet Oral QHS   pantoprazole   80 mg Oral Daily   sevelamer carbonate  2,400 mg Oral TID WC   sodium chloride flush  3 mL Intravenous Q12H   Continuous Infusions:  sodium chloride     sodium chloride 10 mL/hr at 09/19/21 1310   methocarbamol (ROBAXIN) IV      Diet Order             Diet renal with fluid restriction Fluid restriction: 1200 mL Fluid; Room service appropriate? Yes; Fluid consistency: Thin  Diet effective now                   Intake/Output Summary (Last 24 hours) at 10/27/2021 0737 Last data filed at 10/27/2021 0600 Gross per 24 hour  Intake 814 ml  Output 900 ml  Net -86 ml    Net IO Since Admission: -56,876.61 mL [10/27/21 0737]  Wt Readings from Last 3 Encounters:  10/25/21 88.2 kg  08/23/21 96.5 kg  08/07/21 101.1 kg     Unresulted Labs (From admission, onward)     Start     Ordered   08/28/21 0917  MIC Result  Once,   R        08/28/21 0917          Data Reviewed: I have personally reviewed following labs and imaging studies CBC: Recent Labs  Lab 10/21/21 0438 10/21/21 1156 10/22/21 0456 10/23/21 0556 10/25/21 0800  WBC 7.5 6.9 6.6 6.4 6.5  NEUTROABS  --   --   --  3.0  --   HGB 11.9* 11.6* 11.9* 12.0 12.2  HCT 38.2 37.1 37.0 37.2 37.5  MCV 89.9 90.9 88.9 86.9 87.6  PLT 211 219 172 194 944    Basic Metabolic Panel: Recent Labs  Lab 10/21/21 0438 10/21/21 1157 10/22/21 0456 10/23/21 0556 10/25/21 0800  NA 127* 124* 127* 126* 124*  K 5.6* 5.6* 4.8 5.1 5.0  CL 92* 88* 89* 88* 91*  CO2 24 22 25 26 25   GLUCOSE 94 122* 150* 81 125*  BUN 71* 73* 39* 50* 50*  CREATININE 7.33* 7.48* 5.20* 6.27* 5.63*  CALCIUM 8.6* 8.6* 8.9 9.3 8.7*  PHOS 7.7* 7.5* 5.9* 6.9* 6.3*    GFR: Estimated Creatinine Clearance: 13.8 mL/min (A) (by C-G formula based on SCr of 5.63 mg/dL (H)). Liver Function Tests: Recent Labs  Lab 10/21/21 0438 10/21/21 1157 10/22/21 0456 10/23/21 0556 10/25/21 0800  ALBUMIN 2.8* 2.9* 2.9* 3.1* 2.9*    No results for input(s): "LIPASE",  "AMYLASE" in the last 168 hours. No results for input(s): "AMMONIA"  in the last 168 hours. Coagulation Profile: No results for input(s): "INR", "PROTIME" in the last 168 hours. BNP (last 3 results) No results for input(s): "PROBNP" in the last 8760 hours. HbA1C: No results for input(s): "HGBA1C" in the last 72 hours. CBG: Recent Labs  Lab 10/26/21 1139 10/26/21 1634 10/26/21 2044 10/26/21 2129 10/27/21 0717  GLUCAP 97 154* 69* 85 133*     No results found for this or any previous visit (from the past 240 hour(s)).  Antimicrobials: Anti-infectives (From admission, onward)    Start     Dose/Rate Route Frequency Ordered Stop   10/02/21 2000  DAPTOmycin (CUBICIN) 900 mg in sodium chloride 0.9 % IVPB  Status:  Discontinued        10 mg/kg  92.4 kg 136 mL/hr over 30 Minutes Intravenous Once per day on Mon Wed Fri 10/01/21 1350 10/12/21 1146   09/30/21 2000  DAPTOmycin (CUBICIN) 900 mg in sodium chloride 0.9 % IVPB  Status:  Discontinued        10 mg/kg  92.4 kg 136 mL/hr over 30 Minutes Intravenous Daily 09/28/21 0919 10/01/21 1350   09/28/21 2000  DAPTOmycin (CUBICIN) 900 mg in sodium chloride 0.9 % IVPB        900 mg 136 mL/hr over 30 Minutes Intravenous  Once 09/28/21 0919 09/28/21 2146   09/02/21 2000  DAPTOmycin (CUBICIN) 900 mg in sodium chloride 0.9 % IVPB  Status:  Discontinued        10 mg/kg  92.4 kg 136 mL/hr over 30 Minutes Intravenous Once per day on Mon Wed Fri 09/02/21 0836 09/28/21 0919   08/31/21 0745  ceFAZolin (ANCEF) IVPB 2g/100 mL premix        2 g 200 mL/hr over 30 Minutes Intravenous To Short Stay 08/31/21 0730 08/31/21 0918   08/30/21 0600  ceFAZolin (ANCEF) IVPB 2g/100 mL premix  Status:  Discontinued        2 g 200 mL/hr over 30 Minutes Intravenous To Short Stay 08/30/21 0123 08/31/21 0600   08/29/21 2000  DAPTOmycin (CUBICIN) 900 mg in sodium chloride 0.9 % IVPB  Status:  Discontinued        10 mg/kg  92.4 kg 136 mL/hr over 30 Minutes Intravenous  Every 48 hours 08/29/21 1627 09/02/21 0836   08/28/21 1800  ceFAZolin (ANCEF) IVPB 2g/100 mL premix  Status:  Discontinued        2 g 200 mL/hr over 30 Minutes Intravenous Every M-W-F (1800) 08/27/21 0846 08/29/21 1620   08/28/21 1200  ceFAZolin (ANCEF) IVPB 1 g/50 mL premix  Status:  Discontinued        1 g 100 mL/hr over 30 Minutes Intravenous Every M-W-F (Hemodialysis) 08/26/21 1954 08/27/21 0846   08/28/21 0600  ceFAZolin (ANCEF) IVPB 2g/100 mL premix        2 g 200 mL/hr over 30 Minutes Intravenous On call to O.R. 08/27/21 1150 08/28/21 0909   08/27/21 1800  ceFAZolin (ANCEF) IVPB 1 g/50 mL premix        1 g 100 mL/hr over 30 Minutes Intravenous  Once 08/26/21 1954 08/27/21 2048   08/26/21 1500  ceFEPIme (MAXIPIME) 1 g in sodium chloride 0.9 % 100 mL IVPB  Status:  Discontinued        1 g 200 mL/hr over 30 Minutes Intravenous Every 24 hours 08/26/21 1452 08/26/21 1714      Culture/Microbiology    Component Value Date/Time   SDES TISSUE 08/28/2021 0917   SPECREQUEST LEFT THIGH TISSUE  08/28/2021 0917   CULT  08/28/2021 0917    MODERATE ENTEROCOCCUS FAECIUM VANCOMYCIN RESISTANT ENTEROCOCCUS ISOLATED NO ANAEROBES ISOLATED Sent to [facility] for further susceptibility testing. Performed at [facility], [place] [address], [place], [place] [address]    REPTSTATUS 09/02/2021 FINAL 08/28/2021 6063    Other culture-see note  Radiology Studies: No results found.   LOS: 71 days   Antonieta Pert, MD [facility]  10/27/2021, 7:37 AM

## 2021-10-27 NOTE — Progress Notes (Addendum)
Subjective: Seen in room minimal hip discomfort.  Still refuses to sit in dialysis chair tomorrow because concerned of how long she might stay in chair.  Objective Vital signs in last 24 hours: Vitals:   10/26/21 0902 10/26/21 1632 10/26/21 2044 10/27/21 0522  BP: 128/68 134/67 (!) 143/56 (!) 155/56  Pulse: 61 (!) 57 (!) 56 61  Resp: 18 17 18 16   Temp: 98.4 F (36.9 C) 98.3 F (36.8 C) 98.5 F (36.9 C) 98 F (36.7 C)  TempSrc:   Oral   SpO2: 94% 96% 99% 96%  Weight:      Height:       Weight change:   Physical Exam: General: Alert adult female NAD Heart: RRR, no mrg Lungs: CTA  Ant bilateral nonlabored Abdomen: NABS , S, NT, ND  Extremities: LBKA trace edema, no RLE  edema  Dialysis Access: LUA AVF positive bruit     Dialysis Orders: MWF at [facility], 400/500, EDW 90kg, 2K/2Ca, LUE AVF, heparin 4000 unit bolus   Problem/Plan: 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 Back to OR 08/28/21, 08/31/21 for L hip debrid. and wound vac placement (now removed). Finished 6 week course of IV Daptomycin on 10/12/21. 2. ESRD: MWF schedule. Intermittent AVF cannulation issues ,now ok./continue to encourage to sit in chair for HD.  Only 3 hours secondary to staff and numerous patients discussed with patient 3. AVF Access: S/p branch ligation in OR 09/19/2021 per Dr. Virl Cagey.  4. HTN/ volume: BP controlled. On amlodipine/hydralazine/Coreg. May be able to taper off meds with 4 L uf on hd yesterday systolics in the teens.  This a.m. 128/68..Still has edema but improved. Max UF as tolerated and has been reminded of importance of following fluid and sodium restrictions.  Attempt  3 L 5. Anemia of ESRD: Hgb 12.2 Not on ESA.  6. Secondary HPTH: Cca controlled. Phos 6.3. Continue Renvela as binder-increased dose. No VDRA for now. 7. DM2: Insulin per primary 8. Dispo: SNF recommended. refused  Cannot tolerate sitting for prolonged periods and not willing to try HD in a  chair, which she will need to be able to do outpatient.  Encouraged to participate in PT   Ernest Haber, [place] [phone] 10/27/2021,12:25 PM  LOS: 62 days   Labs: Basic Metabolic Panel: Recent Labs  Lab 10/22/21 0456 10/23/21 0556 10/25/21 0800  NA 127* 126* 124*  K 4.8 5.1 5.0  CL 89* 88* 91*  CO2 25 26 25   GLUCOSE 150* 81 125*  BUN 39* 50* 50*  CREATININE 5.20* 6.27* 5.63*  CALCIUM 8.9 9.3 8.7*  PHOS 5.9* 6.9* 6.3*   Liver Function Tests: Recent Labs  Lab 10/22/21 0456 10/23/21 0556 10/25/21 0800  ALBUMIN 2.9* 3.1* 2.9*   No results for input(s): "LIPASE", "AMYLASE" in the last 168 hours. No results for input(s): "AMMONIA" in the last 168 hours. CBC: Recent Labs  Lab 10/21/21 0438 10/21/21 1156 10/22/21 0456 10/23/21 0556 10/25/21 0800  WBC 7.5 6.9 6.6 6.4 6.5  NEUTROABS  --   --   --  3.0  --   HGB 11.9* 11.6* 11.9* 12.0 12.2  HCT 38.2 37.1 37.0 37.2 37.5  MCV 89.9 90.9 88.9 86.9 87.6  PLT 211 219 172 194 197   Cardiac Enzymes: No results for input(s): "CKTOTAL", "CKMB", "CKMBINDEX", "TROPONINI" in the last 168 hours. CBG: Recent Labs  Lab 10/26/21 1634 10/26/21 2044 10/26/21 2129 10/27/21 0717 10/27/21 1134  GLUCAP 154* 69*  85 133* 122*    Studies/Results: No results found. Medications:  sodium chloride     sodium chloride 10 mL/hr at 09/19/21 1310   methocarbamol (ROBAXIN) IV      alosetron  1 mg Oral BID   amitriptyline  25 mg Oral QHS   amLODipine  10 mg Oral Q24H   atorvastatin  40 mg Oral q1800   carvedilol  6.25 mg Oral BID   Chlorhexidine Gluconate Cloth  6 each Topical Q0600   cyclobenzaprine  10 mg Oral TID   docusate sodium  100 mg Oral BID   DULoxetine  60 mg Oral Daily   famotidine  20 mg Oral Daily   furosemide  80 mg Oral Daily   heparin  5,000 Units Subcutaneous Q8H   hydrALAZINE  25 mg Oral TID   insulin aspart  0-6 Units Subcutaneous TID WC   insulin aspart  3 Units Subcutaneous  TID WC   insulin glargine-yfgn  5 Units Subcutaneous QHS   multivitamin  1 tablet Oral QHS   pantoprazole  80 mg Oral Daily   sevelamer carbonate  2,400 mg Oral TID WC   sodium chloride flush  3 mL Intravenous Q12H

## 2021-10-28 DIAGNOSIS — M86252 Subacute osteomyelitis, left femur: Secondary | ICD-10-CM | POA: Diagnosis not present

## 2021-10-28 LAB — GLUCOSE, CAPILLARY
Glucose-Capillary: 133 mg/dL — ABNORMAL HIGH (ref 70–99)
Glucose-Capillary: 141 mg/dL — ABNORMAL HIGH (ref 70–99)
Glucose-Capillary: 231 mg/dL — ABNORMAL HIGH (ref 70–99)
Glucose-Capillary: 101 mg/dL — ABNORMAL HIGH (ref 70–99)

## 2021-10-28 NOTE — Progress Notes (Signed)
PROGRESS NOTE KALASIA CRAFTON  SPQ:[id number] DOB: Mar 11, 1965 DOA: 08/26/2021 PCP: [name], MD   Brief Narrative/Hospital Course: 56 year old woman PMH including ESRD, anemia of CKD, uncontrolled type 2 diabetes, hyperlipidemia, history of CVA, anxiety/depression, GERD, tobacco dependence complicated left hip and femur issues requiring multiple surgeries, removal of hardware presented 6/26 with bloody drainage from surgical incision.  Seen by orthopedics and underwent I&D 6/28 and 7/1.  Currently unable to progress care secondary to refusal to sit in chair for HD.    Subjective: Seen and examined in dialysis. Patient states she was not ready to try dialysis on chair  Overnight BP stable on room air, afebrile No more hypoglycemia  Assessment and Plan: 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  Active Problems:   ESRD (end stage renal disease) on dialysis (HCC)   Type 2 diabetes mellitus with hyperlipidemia (HCC)   History of CVA (cerebrovascular accident)   Essential hypertension   Anxiety and depression   GASTROESOPHAGEAL REFLUX, NO ESOPHAGITIS   TOBACCO DEPENDENCE   Wound infection   Wound dehiscence, surgical, sequela  Subacute osteomyelitis left femur with abscess Infected left hip hardware s/p removal Left intertrochanteric fracture: Seen by orthopedics, underwent left hip debridement, wound VAC placement Dr. [name] (08/28/2021)-VAC removed and now on wound care ortho has cleared the patient for discharge.  Patient completed 6 weeks of daptomycin on 8/12/202.Patient seen later on during the hospitalization by Dr. [name] who recommended lymphedema pump as outpatient,pump cannot be arranged in-house.  Plan is for outpatient follow-up with orthopedics, ID, nephrology.  Patient is worried about hip infection coming back, not willing to try dialysis on chair  ESRD on HD  MWF:AV fistula revision and plans ligation done by vascular.  Continue lasix.  Continue HD  per nephrology.She needs to be able to sit in the chair to tolerate outpatient dialysis prior to discharge.  Discussed extensively with her the need to try dialysis on chair and patient refusing.  Anasarca Volume overload: continue HD and Lasix.TTE 10/09/2021 with EF of 60 to 65%, NWMA.Net IO Since Admission: -55,816.61 mL [10/28/21 1000]   Hyponatremia: Continue HD per nephrology.Cont tom monitor. Recent Labs  Lab 10/21/21 1157 10/22/21 0456 10/23/21 0556 10/25/21 0800  NA 124* 127* 126* 124*  Hyperkalemia resolved on HD. K is on higher side. Recent Labs  Lab 10/21/21 1157 10/22/21 0456 10/23/21 0556 10/25/21 0800  K 5.6* 4.8 5.1 5.0  Anemia of chronic renal disease/CKD with acute blood loss anemia from blood loss in wound: Hemoglobin stable overall.Monitor intermittently.Aranesp per nephrology. Recent Labs  Lab 10/21/21 1156 10/22/21 0456 10/23/21 0556 10/25/21 0800  HGB 11.6* 11.9* 12.0 12.2  HCT 37.1 37.0 37.2 37.5  Type 2 diabetes mellitus with uncontrolled hyperglycemia A1c 7.1, with hypoglycemia x1 overnight 10/26/21 at 69- Semglee was held- will decreased Semglee to  5 units bedtime and premeal insulin to 3 units and NovoLog SSI.  Fairly controlled no more hypoglycemia. Recent Labs  Lab 10/27/21 0717 10/27/21 1134 10/27/21 1709 10/27/21 2126 10/28/21 0735  GLUCAP 133* 122* 153* 209* 133*  Hypertension: Stable, continue Coreg, Norvasc, Lasix, hydralazine.hydralazine dose has been decreased to 25 mg 3 times daily on 8/17.  Monitor.  Hyperlipidemia/History of JFH:[id number] statin.  Depression/anxiety Stable.cont her Elavil/Cymbalta/Flexeril GERD: cont Pepcid, PPI. Tobacco dependence:tobacco cessation.  DVT prophylaxis: Place TED hose Start: 09/06/21 1507 SCDs Start: 08/31/21 1058 heparin injection 5,000 Units Start: 08/26/21 1615 Code Status:   Code Status: Full Code Family Communication: plan of care discussed  with patient at bedside. Patient status is:  inpatient  because of ongoing need for inpatient dialysis, will need to be able to sit on chair for dialysis, pending placement to SNF.  Patient reports she cannot tolerate sitting for prolonged periods and not willing to try HD in a chair  Level of care: Med-Surg   Dispo: The patient is from: home            Anticipated disposition: SNF tbd  Consultants:  Orthopedics: Dr. [name] 08/27/2021 ID: Dr. Gale Journey 08/27/2021 Nephrology: Dr.Schertz 08/27/2021 Vascular surgery: Dr. Unk Lightning 09/06/2021     Procedures:  CT left hip 08/26/2021 Plain films of the right knee 08/26/2021 Plain films of the right foot 08/26/2021 2D echo 10/09/2021 Vascular ultrasound duplex dialysis access 09/07/2021 Fistula revision branch ligation per Dr. Unk Lightning 09/19/2021- Left hip debridement, application of wound VAC by Dr. [name] 08/28/2021  Mobility Assessment (last 72 hours)     Mobility Assessment     Row Name 10/28/21 0036 10/27/21 2300 10/27/21 0848 10/26/21 1948     Does patient have an order for bedrest or is patient medically unstable No - Continue assessment No - Continue assessment No - Continue assessment No - Continue assessment    What is the highest level of mobility based on the progressive mobility assessment? Level 2 (Chairfast) - Balance while sitting on edge of bed and cannot stand Level 2 (Chairfast) - Balance while sitting on edge of bed and cannot stand Level 2 (Chairfast) - Balance while sitting on edge of bed and cannot stand Level 2 (Chairfast) - Balance while sitting on edge of bed and cannot stand    Is the above level different from baseline mobility prior to current illness? -- Yes - Recommend PT order Yes - Recommend PT order Yes - Recommend PT order              Objective: Vitals last 24 hrs: Vitals:   10/27/21 2123 10/28/21 0823 10/28/21 0841 10/28/21 0852  BP: (!) 125/49 (!) 148/89    Pulse: 63 65    Resp: 16 18    Temp: 98.7 F (37.1 C) 97.9 F (36.6 C)  (!) 97.3 F (36.3 C)  TempSrc:     Oral  SpO2: 94% 97%    Weight:   92.7 kg   Height:       Weight change:   Physical Examination: General exam: AAox3, older than stated age, weak appearing. HEENT:Oral mucosa moist, Ear/Nose WNL grossly, dentition normal. Respiratory system: bilaterally diminished, no use of accessory muscle Cardiovascular system: S1 & S2 +, No JVD,. Gastrointestinal system: Abdomen soft,NT,ND,BS+ Nervous System:Alert, awake, moving extremities and grossly nonfocal Extremities: LE ankle edema neg lt BKA Skin: No rashes,no icterus. MSK: Normal muscle bulk,tone, power   Medications reviewed:  Scheduled Meds:  alosetron  1 mg Oral BID   amitriptyline  25 mg Oral QHS   amLODipine  10 mg Oral Q24H   atorvastatin  40 mg Oral q1800   carvedilol  6.25 mg Oral BID   Chlorhexidine Gluconate Cloth  6 each Topical Q0600   cyclobenzaprine  10 mg Oral TID   docusate sodium  100 mg Oral BID   DULoxetine  60 mg Oral Daily   famotidine  20 mg Oral Daily   furosemide  80 mg Oral Daily   heparin  5,000 Units Subcutaneous Q8H   hydrALAZINE  25 mg Oral TID   insulin aspart  0-6 Units Subcutaneous TID WC   insulin aspart  3 Units Subcutaneous TID WC   insulin glargine-yfgn  5 Units Subcutaneous QHS   multivitamin  1 tablet Oral QHS   pantoprazole  80 mg Oral Daily   sevelamer carbonate  2,400 mg Oral TID WC   sodium chloride flush  3 mL Intravenous Q12H   Continuous Infusions:  sodium chloride     sodium chloride 10 mL/hr at 09/19/21 1310   methocarbamol (ROBAXIN) IV      Diet Order             Diet renal with fluid restriction Fluid restriction: 1200 mL Fluid; Room service appropriate? Yes; Fluid consistency: Thin  Diet effective now                   Intake/Output Summary (Last 24 hours) at 10/28/2021 1000 Last data filed at 10/28/2021 0805 Gross per 24 hour  Intake 1320 ml  Output 500 ml  Net 820 ml   Net IO Since Admission: -55,816.61 mL [10/28/21 1000]  Wt Readings from Last 3  Encounters:  10/28/21 92.7 kg  08/23/21 96.5 kg  08/07/21 101.1 kg     Unresulted Labs (From admission, onward)     Start     Ordered   08/28/21 0917  MIC Result  Once,   R        08/28/21 0917   Signed and Held  Renal function panel  Once,   R       Question:  Specimen collection method  Answer:  Lab=Lab collect   Signed and Held   Signed and Held  CBC  Once,   R       Question:  Specimen collection method  Answer:  Lab=Lab collect   Signed and Held          Data Reviewed: I have personally reviewed following labs and imaging studies CBC: Recent Labs  Lab 10/21/21 1156 10/22/21 0456 10/23/21 0556 10/25/21 0800  WBC 6.9 6.6 6.4 6.5  NEUTROABS  --   --  3.0  --   HGB 11.6* 11.9* 12.0 12.2  HCT 37.1 37.0 37.2 37.5  MCV 90.9 88.9 86.9 87.6  PLT 219 172 194 939   Basic Metabolic Panel: Recent Labs  Lab 10/21/21 1157 10/22/21 0456 10/23/21 0556 10/25/21 0800  NA 124* 127* 126* 124*  K 5.6* 4.8 5.1 5.0  CL 88* 89* 88* 91*  CO2 22 25 26 25   GLUCOSE 122* 150* 81 125*  BUN 73* 39* 50* 50*  CREATININE 7.48* 5.20* 6.27* 5.63*  CALCIUM 8.6* 8.9 9.3 8.7*  PHOS 7.5* 5.9* 6.9* 6.3*   GFR: Estimated Creatinine Clearance: 14.1 mL/min (A) (by C-G formula based on SCr of 5.63 mg/dL (H)). Liver Function Tests: Recent Labs  Lab 10/21/21 1157 10/22/21 0456 10/23/21 0556 10/25/21 0800  ALBUMIN 2.9* 2.9* 3.1* 2.9*   No results for input(s): "LIPASE", "AMYLASE" in the last 168 hours. No results for input(s): "AMMONIA" in the last 168 hours. Coagulation Profile: No results for input(s): "INR", "PROTIME" in the last 168 hours. BNP (last 3 results) No results for input(s): "PROBNP" in the last 8760 hours. HbA1C: No results for input(s): "HGBA1C" in the last 72 hours. CBG: Recent Labs  Lab 10/27/21 0717 10/27/21 1134 10/27/21 1709 10/27/21 2126 10/28/21 0735  GLUCAP 133* 122* 153* 209* 133*    No results found for this or any previous visit (from the past 240  hour(s)).  Antimicrobials: Anti-infectives (From admission, onward)    Start  Dose/Rate Route Frequency Ordered Stop   10/02/21 2000  DAPTOmycin (CUBICIN) 900 mg in sodium chloride 0.9 % IVPB  Status:  Discontinued        10 mg/kg  92.4 kg 136 mL/hr over 30 Minutes Intravenous Once per day on Mon Wed Fri 10/01/21 1350 10/12/21 1146   09/30/21 2000  DAPTOmycin (CUBICIN) 900 mg in sodium chloride 0.9 % IVPB  Status:  Discontinued        10 mg/kg  92.4 kg 136 mL/hr over 30 Minutes Intravenous Daily 09/28/21 0919 10/01/21 1350   09/28/21 2000  DAPTOmycin (CUBICIN) 900 mg in sodium chloride 0.9 % IVPB        900 mg 136 mL/hr over 30 Minutes Intravenous  Once 09/28/21 0919 09/28/21 2146   09/02/21 2000  DAPTOmycin (CUBICIN) 900 mg in sodium chloride 0.9 % IVPB  Status:  Discontinued        10 mg/kg  92.4 kg 136 mL/hr over 30 Minutes Intravenous Once per day on Mon Wed Fri 09/02/21 0836 09/28/21 0919   08/31/21 0745  ceFAZolin (ANCEF) IVPB 2g/100 mL premix        2 g 200 mL/hr over 30 Minutes Intravenous To Short Stay 08/31/21 0730 08/31/21 0918   08/30/21 0600  ceFAZolin (ANCEF) IVPB 2g/100 mL premix  Status:  Discontinued        2 g 200 mL/hr over 30 Minutes Intravenous To Short Stay 08/30/21 0123 08/31/21 0600   08/29/21 2000  DAPTOmycin (CUBICIN) 900 mg in sodium chloride 0.9 % IVPB  Status:  Discontinued        10 mg/kg  92.4 kg 136 mL/hr over 30 Minutes Intravenous Every 48 hours 08/29/21 1627 09/02/21 0836   08/28/21 1800  ceFAZolin (ANCEF) IVPB 2g/100 mL premix  Status:  Discontinued        2 g 200 mL/hr over 30 Minutes Intravenous Every M-W-F (1800) 08/27/21 0846 08/29/21 1620   08/28/21 1200  ceFAZolin (ANCEF) IVPB 1 g/50 mL premix  Status:  Discontinued        1 g 100 mL/hr over 30 Minutes Intravenous Every M-W-F (Hemodialysis) 08/26/21 1954 08/27/21 0846   08/28/21 0600  ceFAZolin (ANCEF) IVPB 2g/100 mL premix        2 g 200 mL/hr over 30 Minutes Intravenous On call to  O.R. 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 Performed at [facility], [place] [address], [place], [place] [address]    REPTSTATUS 09/02/2021 FINAL 08/28/2021 6256    Other culture-see note  Radiology Studies: No results found.   LOS: 67 days   Antonieta Pert, MD [facility]  10/28/2021, 10:00 AM

## 2021-10-28 NOTE — Progress Notes (Signed)
Pt off the unit to hemodialysis  

## 2021-10-28 NOTE — Progress Notes (Signed)
[facility] Progress Note   Subjective:   Seen prior to HD. Discussed discharge planning. She prefers to go home, thinks she could sit in a recliner for outpatient HD but HD here takes longer and she thinks she would be stuck in the chair for too long. Discussed that transport in and out of the HD center is also a barrier to discharge. Will check in with SW today.   She feels well, denies SOB, CP, dizziness, and nausea.   Objective Vitals:   10/26/21 2044 10/27/21 0522 10/27/21 1711 10/27/21 2123  BP: (!) 143/56 (!) 155/56 (!) 149/76 (!) 125/49  Pulse: (!) 56 61 63 63  Resp: 18 16 16 16   Temp: 98.5 F (36.9 C) 98 F (36.7 C) 98.2 F (36.8 C) 98.7 F (37.1 C)  TempSrc: Oral     SpO2: 99% 96% 100% 94%  Weight:      Height:       Physical Exam General: [name] Heart: RRR, no murmurs, rubs or gallops Lungs: CTA bilaterally, respirations unlabored Abdomen: Soft, non-tender, non-distended, +BS Extremities: L BKA 1+ edema, no edema RLE Dialysis Access:  LUE AVF + bruit  Additional Objective Labs: Basic Metabolic Panel: Recent Labs  Lab 10/22/21 0456 10/23/21 0556 10/25/21 0800  NA 127* 126* 124*  K 4.8 5.1 5.0  CL 89* 88* 91*  CO2 25 26 25   GLUCOSE 150* 81 125*  BUN 39* 50* 50*  CREATININE 5.20* 6.27* 5.63*  CALCIUM 8.9 9.3 8.7*  PHOS 5.9* 6.9* 6.3*   Liver Function Tests: Recent Labs  Lab 10/22/21 0456 10/23/21 0556 10/25/21 0800  ALBUMIN 2.9* 3.1* 2.9*   No results for input(s): "LIPASE", "AMYLASE" in the last 168 hours. CBC: Recent Labs  Lab 10/21/21 1156 10/22/21 0456 10/23/21 0556 10/25/21 0800  WBC 6.9 6.6 6.4 6.5  NEUTROABS  --   --  3.0  --   HGB 11.6* 11.9* 12.0 12.2  HCT 37.1 37.0 37.2 37.5  MCV 90.9 88.9 86.9 87.6  PLT 219 172 194 197   Blood Culture    Component Value Date/Time   SDES TISSUE 08/28/2021 0917   SPECREQUEST LEFT THIGH TISSUE 08/28/2021 0917   CULT  08/28/2021 0917    MODERATE ENTEROCOCCUS  FAECIUM VANCOMYCIN RESISTANT ENTEROCOCCUS ISOLATED NO ANAEROBES ISOLATED Sent to [facility] for further susceptibility testing. Performed at [facility], [place] [address], [place], [place] [address]    REPTSTATUS 09/02/2021 FINAL 08/28/2021 5638    Cardiac Enzymes: No results for input(s): "CKTOTAL", "CKMB", "CKMBINDEX", "TROPONINI" in the last 168 hours. CBG: Recent Labs  Lab 10/27/21 0717 10/27/21 1134 10/27/21 1709 10/27/21 2126 10/28/21 0735  GLUCAP 133* 122* 153* 209* 133*   Iron Studies: No results for input(s): "IRON", "TIBC", "TRANSFERRIN", "FERRITIN" in the last 72 hours. @lablastinr3 @ Studies/Results: No results found. Medications:  sodium chloride     sodium chloride 10 mL/hr at 09/19/21 1310   methocarbamol (ROBAXIN) IV      alosetron  1 mg Oral BID   amitriptyline  25 mg Oral QHS   amLODipine  10 mg Oral Q24H   atorvastatin  40 mg Oral q1800   carvedilol  6.25 mg Oral BID   Chlorhexidine Gluconate Cloth  6 each Topical Q0600   cyclobenzaprine  10 mg Oral TID   docusate sodium  100 mg Oral BID   DULoxetine  60 mg Oral Daily   famotidine  20 mg Oral Daily   furosemide  80 mg Oral  Daily   heparin  5,000 Units Subcutaneous Q8H   hydrALAZINE  25 mg Oral TID   insulin aspart  0-6 Units Subcutaneous TID WC   insulin aspart  3 Units Subcutaneous TID WC   insulin glargine-yfgn  5 Units Subcutaneous QHS   multivitamin  1 tablet Oral QHS   pantoprazole  80 mg Oral Daily   sevelamer carbonate  2,400 mg Oral TID WC   sodium chloride flush  3 mL Intravenous Q12H    Outpatient Dialysis Orders: MWF at [facility], 400/500, EDW 90kg, 2K/2Ca, LUE AVF, heparin 4000 unit bolus  Assessment/Plan: 1. 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 Back to OR 08/28/21, 08/31/21 for L hip debrid. and wound vac placement (now removed). Finished 6 week course of IV Daptomycin on 10/12/21. 2. ESRD: MWF schedule. Was having intermittent cannulation  issues but AVF now working well.  3. AVF Access: S/p branch ligation in OR 09/19/2021 per Dr. Virl Cagey.  4. HTN/ volume: BP controlled. On amlodipine/hydralazine/Coreg. Still has edema but improved. Max UF as tolerated and has been reminded of importance of following fluid and sodium restrictions.   5. Anemia of ESRD: Hgb 12.2 Not on ESA.  6. Secondary HPTH: Cca controlled. Phos 6.3. Continue Renvela as binder-increased dose. No VDRA for now. 7. DM2: Insulin per primary 8. Dispo: SNF recommended but patient now wants to go home. Refused to sit in a recliner here but thinks she could tolerate it outpatient, I agree that there are no major physical barriers to her sitting in a chair. Transport in and out of HD center also an issue, will discuss with SW today  Anice Paganini, PA-C 10/28/2021, 8:39 AM  [facility] Pager: [phone]

## 2021-10-29 DIAGNOSIS — M86252 Subacute osteomyelitis, left femur: Secondary | ICD-10-CM | POA: Diagnosis not present

## 2021-10-29 LAB — GLUCOSE, CAPILLARY
Glucose-Capillary: 175 mg/dL — ABNORMAL HIGH (ref 70–99)
Glucose-Capillary: 178 mg/dL — ABNORMAL HIGH (ref 70–99)
Glucose-Capillary: 168 mg/dL — ABNORMAL HIGH (ref 70–99)
Glucose-Capillary: 136 mg/dL — ABNORMAL HIGH (ref 70–99)

## 2021-10-29 LAB — HEPATITIS B SURFACE ANTIBODY,QUALITATIVE: Hep B S Ab: NONREACTIVE

## 2021-10-29 LAB — HEPATITIS B CORE ANTIBODY, TOTAL: Hep B Core Total Ab: NONREACTIVE

## 2021-10-29 LAB — HEPATITIS B SURFACE ANTIGEN: Hepatitis B Surface Ag: NONREACTIVE

## 2021-10-29 MED ORDER — CHLORHEXIDINE GLUCONATE CLOTH 2 % EX PADS: 6.0000 | MEDICATED_PAD | Freq: Every day | CUTANEOUS | Status: DC

## 2021-10-29 NOTE — Progress Notes (Signed)
PROGRESS NOTE [name]  RKY:[id number] DOB: November 03, 1965 DOA: 08/26/2021 PCP: [name], MD   Brief Narrative/Hospital Course: 56 year old woman PMH including ESRD, anemia of CKD, uncontrolled type 2 diabetes, hyperlipidemia, history of CVA, anxiety/depression, GERD, tobacco dependence complicated left hip and femur issues requiring multiple surgeries, removal of hardware presented 6/26 with bloody drainage from surgical incision.  Seen by orthopedics and underwent I&D 6/28 and 7/1.  Currently unable to progress care secondary to refusal to sit in chair for HD.    Subjective: Seen and examined this morning.   Reports she is going to work with PT OT and try to sit in the chair  Asking if SNF is already lined up for her. She reports he will see if she can try next dialysis in chair  Assessment and Plan: 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  Active Problems:   ESRD (end stage renal disease) on dialysis ([place])   Type 2 diabetes mellitus with hyperlipidemia (HCC)   History of CVA (cerebrovascular accident)   Essential hypertension   Anxiety and depression   GASTROESOPHAGEAL REFLUX, NO ESOPHAGITIS   TOBACCO DEPENDENCE   Wound infection   Wound dehiscence, surgical, sequela  Subacute osteomyelitis left femur with abscess Infected left hip hardware s/p removal Left intertrochanteric fracture: Seen by orthopedics, underwent left hip debridement, wound VAC placement Dr. [name] (08/28/2021)-VAC removed and now on wound care ortho has cleared the patient for discharge.  Patient completed 6 weeks of daptomycin on 8/12/202.Patient seen later on during the hospitalization by Dr. [name] who recommended lymphedema pump as outpatient,pump cannot be arranged in-house.  Plan is for outpatient follow-up with orthopedics, ID, nephrology.  Patient is worried about hip infection coming back, not willing to try dialysis on chair- now wants to try next time  ESRD on HD  MWF:AV  fistula revision and plans ligation done by vascular.  Continue lasix.  Continue HD per nephrology> she needs to be able to sit in the chair to tolerate outpatient dialysis prior to discharge.  She is willing to try- but she has been saying same thing.  Anasarca Volume overload: continue HD and Lasix.TTE 10/09/2021 with EF of 60 to 65%, NWMA.Net IO Since Admission: -55,879.61 mL [10/29/21 1124]   Hyponatremia: Continue HD per nephrology.Cont to monitor. Recent Labs  Lab 10/23/21 0556 10/25/21 0800  NA 126* 124*   Hyperkalemia resolved on HD. K is on higher side.  Monitor Recent Labs  Lab 10/23/21 0556 10/25/21 0800  K 5.1 5.0   Anemia of chronic renal disease/CKD with acute blood loss anemia from blood loss in wound: Hb stable overall.Monitor intermittently.Aranesp as per nephrology. Recent Labs  Lab 10/23/21 0556 10/25/21 0800  HGB 12.0 12.2  HCT 37.2 37.5   T2DM w/ uncontrolled hyperglycemia-A1c 7.1, with hypoglycemia x1 overnight 10/26/21 at 69- Semglee was held- decreased Semglee to 5 units bedtime and premeal insulin to 3 units and NovoLog SSI.  [place] stable at this time Recent Labs  Lab 10/28/21 0735 10/28/21 1313 10/28/21 1722 10/28/21 2231 10/29/21 0726  GLUCAP 133* 141* 231* 101* 175*   Hypertension: Well controlled on Norvasc, Lasix, hydralazine. Hydralazine dose has been decreased to 25 mg 3 times daily on 8/17.  Monitor.  Hyperlipidemia/History of BTD:VVOHYWVP statin.  Depression/anxiety Stable.cont her Elavil/Cymbalta/Flexeril GERD: cont Pepcid, PPI. Tobacco dependence:tobacco cessation.  DVT prophylaxis: Place TED hose Start: 09/06/21 1507 SCDs Start: 08/31/21 1058 heparin injection 5,000 Units Start: 08/26/21 1615 Code Status:   Code Status: Full Code  Family Communication: plan of care discussed with patient at bedside. Patient status is: inpatient  because of ongoing need for inpatient dialysis, will need to be able to sit on chair for dialysis,  pending placement to SNF.  Patient reports she cannot tolerate sitting for prolonged periods and not willing to try HD in a chair.  States she will try to sit on chair with PT  Level of care: Med-Surg   Dispo: The patient is from: home            Anticipated disposition: SN/TBD  Consultants:  Orthopedics: Dr. [name] 08/27/2021 ID: Dr. Gale Journey 08/27/2021 Nephrology: Dr.Schertz 08/27/2021 Vascular surgery: Dr. Unk Lightning 09/06/2021     Procedures:  CT left hip 08/26/2021 Plain films of the right knee 08/26/2021 Plain films of the right foot 08/26/2021 2D echo 10/09/2021 Vascular ultrasound duplex dialysis access 09/07/2021 Fistula revision branch ligation per Dr. Unk Lightning 09/19/2021- Left hip debridement, application of wound VAC by Dr. [name] 08/28/2021  Mobility Assessment (last 72 hours)     Mobility Assessment     Row Name 10/28/21 0800 10/28/21 0036 10/27/21 2300 10/27/21 0848 10/26/21 1948   Does patient have an order for bedrest or is patient medically unstable No - Continue assessment No - Continue assessment No - Continue assessment No - Continue assessment No - Continue assessment   What is the highest level of mobility based on the progressive mobility assessment? Level 2 (Chairfast) - Balance while sitting on edge of bed and cannot stand Level 2 (Chairfast) - Balance while sitting on edge of bed and cannot stand Level 2 (Chairfast) - Balance while sitting on edge of bed and cannot stand Level 2 (Chairfast) - Balance while sitting on edge of bed and cannot stand Level 2 (Chairfast) - Balance while sitting on edge of bed and cannot stand   Is the above level different from baseline mobility prior to current illness? Yes - Recommend PT order -- Yes - Recommend PT order Yes - Recommend PT order Yes - Recommend PT order             Objective: Vitals last 24 hrs: Vitals:   10/28/21 1724 10/28/21 2228 10/29/21 0632 10/29/21 0905  BP: 121/61 131/66 (!) 141/58 (!) 154/80  Pulse: 61 (!) 58 (!) 58 65   Resp: 18 18 12 18   Temp: 98.6 F (37 C) 98.5 F (36.9 C) 98 F (36.7 C) 98 F (36.7 C)  TempSrc:  Oral Oral Oral  SpO2: 96% 97% 99% 99%  Weight:      Height:       Weight change:   Physical Examination: General exam: AAOX3, older than stated age, weak appearing. HEENT:Oral mucosa moist, Ear/Nose WNL grossly, dentition normal. Respiratory system: bilaterally diminished, no use of accessory muscle Cardiovascular system: S1 & S2 +, No JVD,. Gastrointestinal system: Abdomen soft,NT,ND,BS+ Nervous System:Alert, awake, moving extremities and grossly nonfocal Extremities: LE ankle edema NEG, LT bka, distal peripheral pulses palpable.  Skin: No rashes,no icterus. MSK: Normal muscle bulk,tone, power   Medications reviewed:  Scheduled Meds:  alosetron  1 mg Oral BID   amitriptyline  25 mg Oral QHS   amLODipine  10 mg Oral Q24H   atorvastatin  40 mg Oral q1800   carvedilol  6.25 mg Oral BID   Chlorhexidine Gluconate Cloth  6 each Topical Q0600   cyclobenzaprine  10 mg Oral TID   docusate sodium  100 mg Oral BID   DULoxetine  60 mg Oral Daily   famotidine  20 mg Oral Daily   furosemide  80 mg Oral Daily   heparin  5,000 Units Subcutaneous Q8H   hydrALAZINE  25 mg Oral TID   insulin aspart  0-6 Units Subcutaneous TID WC   insulin aspart  3 Units Subcutaneous TID WC   insulin glargine-yfgn  5 Units Subcutaneous QHS   multivitamin  1 tablet Oral QHS   pantoprazole  80 mg Oral Daily   sevelamer carbonate  2,400 mg Oral TID WC   sodium chloride flush  3 mL Intravenous Q12H   Continuous Infusions:  sodium chloride     sodium chloride 10 mL/hr at 09/19/21 1310   methocarbamol (ROBAXIN) IV      Diet Order             Diet renal with fluid restriction Fluid restriction: 1200 mL Fluid; Room service appropriate? Yes; Fluid consistency: Thin  Diet effective now                   Intake/Output Summary (Last 24 hours) at 10/29/2021 1124 Last data filed at 10/29/2021  0911 Gross per 24 hour  Intake 440 ml  Output 503 ml  Net -63 ml    Net IO Since Admission: -55,879.61 mL [10/29/21 1124]  Wt Readings from Last 3 Encounters:  10/28/21 89.2 kg  08/23/21 96.5 kg  08/07/21 101.1 kg     Unresulted Labs (From admission, onward)     Start     Ordered   08/28/21 0917  MIC Result  Once,   R        08/28/21 0917   Signed and Held  Renal function panel  Once,   R       Question:  Specimen collection method  Answer:  Lab=Lab collect   Signed and Held   Signed and Held  CBC  Once,   R       Question:  Specimen collection method  Answer:  Lab=Lab collect   Signed and Held   Signed and Held  Renal function panel  Tomorrow morning,   R       Question:  Specimen collection method  Answer:  Lab=Lab collect   Signed and Held   Signed and Held  CBC  Tomorrow morning,   R       Question:  Specimen collection method  Answer:  Lab=Lab collect   Signed and Held          Data Reviewed: I have personally reviewed following labs and imaging studies CBC: Recent Labs  Lab 10/23/21 0556 10/25/21 0800  WBC 6.4 6.5  NEUTROABS 3.0  --   HGB 12.0 12.2  HCT 37.2 37.5  MCV 86.9 87.6  PLT 194 259    Basic Metabolic Panel: Recent Labs  Lab 10/23/21 0556 10/25/21 0800  NA 126* 124*  K 5.1 5.0  CL 88* 91*  CO2 26 25  GLUCOSE 81 125*  BUN 50* 50*  CREATININE 6.27* 5.63*  CALCIUM 9.3 8.7*  PHOS 6.9* 6.3*    GFR: Estimated Creatinine Clearance: 13.8 mL/min (A) (by C-G formula based on SCr of 5.63 mg/dL (H)). Liver Function Tests: Recent Labs  Lab 10/23/21 0556 10/25/21 0800  ALBUMIN 3.1* 2.9*    No results for input(s): "LIPASE", "AMYLASE" in the last 168 hours. No results for input(s): "AMMONIA" in the last 168 hours. Coagulation Profile: No results for input(s): "INR", "PROTIME" in the last 168 hours. BNP (last 3 results) No results for input(s): "PROBNP" in  the last 8760 hours. HbA1C: No results for input(s): "HGBA1C" in the last 72  hours. CBG: Recent Labs  Lab 10/28/21 0735 10/28/21 1313 10/28/21 1722 10/28/21 2231 10/29/21 0726  GLUCAP 133* 141* 231* 101* 175*     No results found for this or any previous visit (from the past 240 hour(s)).  Antimicrobials: Anti-infectives (From admission, onward)    Start     Dose/Rate Route Frequency Ordered Stop   10/02/21 2000  DAPTOmycin (CUBICIN) 900 mg in sodium chloride 0.9 % IVPB  Status:  Discontinued        10 mg/kg  92.4 kg 136 mL/hr over 30 Minutes Intravenous Once per day on Mon Wed Fri 10/01/21 1350 10/12/21 1146   09/30/21 2000  DAPTOmycin (CUBICIN) 900 mg in sodium chloride 0.9 % IVPB  Status:  Discontinued        10 mg/kg  92.4 kg 136 mL/hr over 30 Minutes Intravenous Daily 09/28/21 0919 10/01/21 1350   09/28/21 2000  DAPTOmycin (CUBICIN) 900 mg in sodium chloride 0.9 % IVPB        900 mg 136 mL/hr over 30 Minutes Intravenous  Once 09/28/21 0919 09/28/21 2146   09/02/21 2000  DAPTOmycin (CUBICIN) 900 mg in sodium chloride 0.9 % IVPB  Status:  Discontinued        10 mg/kg  92.4 kg 136 mL/hr over 30 Minutes Intravenous Once per day on Mon Wed Fri 09/02/21 0836 09/28/21 0919   08/31/21 0745  ceFAZolin (ANCEF) IVPB 2g/100 mL premix        2 g 200 mL/hr over 30 Minutes Intravenous To Short Stay 08/31/21 0730 08/31/21 0918   08/30/21 0600  ceFAZolin (ANCEF) IVPB 2g/100 mL premix  Status:  Discontinued        2 g 200 mL/hr over 30 Minutes Intravenous To Short Stay 08/30/21 0123 08/31/21 0600   08/29/21 2000  DAPTOmycin (CUBICIN) 900 mg in sodium chloride 0.9 % IVPB  Status:  Discontinued        10 mg/kg  92.4 kg 136 mL/hr over 30 Minutes Intravenous Every 48 hours 08/29/21 1627 09/02/21 0836   08/28/21 1800  ceFAZolin (ANCEF) IVPB 2g/100 mL premix  Status:  Discontinued        2 g 200 mL/hr over 30 Minutes Intravenous Every M-W-F (1800) 08/27/21 0846 08/29/21 1620   08/28/21 1200  ceFAZolin (ANCEF) IVPB 1 g/50 mL premix  Status:  Discontinued        1  g 100 mL/hr over 30 Minutes Intravenous Every M-W-F (Hemodialysis) 08/26/21 1954 08/27/21 0846   08/28/21 0600  ceFAZolin (ANCEF) IVPB 2g/100 mL premix        2 g 200 mL/hr over 30 Minutes Intravenous On call to O.R. 08/27/21 1150 08/28/21 0909   08/27/21 1800  ceFAZolin (ANCEF) IVPB 1 g/50 mL premix        1 g 100 mL/hr over 30 Minutes Intravenous  Once 08/26/21 1954 08/27/21 2048   08/26/21 1500  ceFEPIme (MAXIPIME) 1 g in sodium chloride 0.9 % 100 mL IVPB  Status:  Discontinued        1 g 200 mL/hr over 30 Minutes Intravenous Every 24 hours 08/26/21 1452 08/26/21 1714      Culture/Microbiology    Component Value Date/Time   SDES TISSUE 08/28/2021 [place] TISSUE 08/28/2021 0917   CULT  08/28/2021 0917    MODERATE ENTEROCOCCUS FAECIUM VANCOMYCIN RESISTANT ENTEROCOCCUS ISOLATED NO ANAEROBES ISOLATED Sent to [place] for further susceptibility  testing. Performed at [facility], [place] [address], [place], [place] [address]    REPTSTATUS 09/02/2021 FINAL 08/28/2021 3276    Other culture-see note  Radiology Studies: No results found.   LOS: 75 days   Antonieta Pert, MD [facility]  10/29/2021, 11:24 AM

## 2021-10-29 NOTE — Progress Notes (Signed)
Physical Therapy Treatment Patient Details Name: [name] MRN: [id number] DOB: Apr 25, 1965 Today's Date: 10/29/2021   History of Present Illness 56 y.o. female presented to ED 6/26 from dialysis with increased bloody drainage from L hip wound. 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 s/p hardware removal of left hip with partial resection of tuft tissue and femure that were nonviable on 08/10/21 Discharged home 6/23. underwent L hip debridement and wound vac placement 6/28; +Left intertrochanteric non-union,  Fracture of left distal femur  PMH: hypertension, hyperlipidemia, ESRD on HD MWF, history of left BKA in 2021, depression/anxiety, stroke, tobacco use, T2DM,  insomnia, chronic pain syndrome,    PT Comments    Pt up in chair, ready to get back to bed. Attempted stand pivot however unable to achieve fully upright due to increased R knee pain. Pt ultimately requires min A for slide board transfer back to bed. D/c plan remains appropriate. PT will continue to follow acutely.     Recommendations for follow up therapy are one component of a multi-disciplinary discharge planning process, led by the attending physician.  Recommendations may be updated based on patient status, additional functional criteria and insurance authorization.  Follow Up Recommendations  Skilled nursing-short term rehab (<3 hours/day) Can patient physically be transported by private vehicle: No   Assistance Recommended at Discharge Intermittent Supervision/Assistance  Patient can return home with the following A lot of help with bathing/dressing/bathroom;Assistance with cooking/housework;Assist for transportation;Two people to help with walking and/or transfers;Help with stairs or ramp for entrance   Equipment Recommendations  Other (comment) (Seems well-equipped; agree with looking into medical [name] transport for HD)       Precautions / Restrictions Precautions Precautions:  Fall;Other (comment) Precaution Comments: L BKA (baseline) Restrictions Weight Bearing Restrictions: No LLE Weight Bearing: Weight bearing as tolerated Other Position/Activity Restrictions: L distal femur fx; per Dr [name] 8/10 pt can full WB     Mobility  Bed Mobility Overal bed mobility: Needs Assistance Bed Mobility: Supine to Sit     Supine to sit: Modified independent (Device/Increase time)     General bed mobility comments: increased time and effort    Transfers Overall transfer level: Needs assistance Equipment used: Sliding board Transfers: Bed to chair/wheelchair/BSC Sit to Stand: Total assist          Lateral/Scoot Transfers: Min assist (chair to bed) General transfer comment: 2x attempt to come to standing from chair to RW and 2x with face to face assist, pt reports R knee pain is too great to bear weight, with slide board transfer pt reports she is unable to place slide board and requires assist for balance with slide to bed                    Balance Overall balance assessment: Needs assistance Sitting-balance support: Feet supported Sitting balance-Leahy Scale: Good Sitting balance - Comments: no assistance with sitting balance                                    Cognition Arousal/Alertness: Awake/alert Behavior During Therapy: WFL for tasks assessed/performed Overall Cognitive Status: Within Functional Limits for tasks assessed                                 General Comments: continues to be self limiting  General Comments General comments (skin integrity, edema, etc.): VSS on RA      Pertinent Vitals/Pain Pain Assessment Pain Assessment: Faces Faces Pain Scale: Hurts even more Pain Location: R knee Pain Descriptors / Indicators: Sore Pain Intervention(s): Limited activity within patient's tolerance, Monitored during session, Repositioned, Patient requesting pain meds-RN notified     PT Goals  (current goals can now be found in the care plan section) Acute Rehab PT Goals PT Goal Formulation: With patient Time For Goal Achievement: 11/07/21 Potential to Achieve Goals: Fair Progress towards PT goals: Not progressing toward goals - comment    Frequency    Min 2X/week      PT Plan Current plan remains appropriate       AM-PAC PT "6 Clicks" Mobility   Outcome Measure  Help needed turning from your back to your side while in a flat bed without using bedrails?: None Help needed moving from lying on your back to sitting on the side of a flat bed without using bedrails?: None Help needed moving to and from a bed to a chair (including a wheelchair)?: A Little Help needed standing up from a chair using your arms (e.g., wheelchair or bedside chair)?: Total Help needed to walk in hospital room?: Total Help needed climbing 3-5 steps with a railing? : Total 6 Click Score: 14    End of Session Equipment Utilized During Treatment: Gait belt Activity Tolerance: Patient tolerated treatment well Patient left: in bed;with call bell/phone within reach   PT Visit Diagnosis: Other abnormalities of gait and mobility (R26.89);Muscle weakness (generalized) (M62.81);History of falling (Z91.81);Pain Pain - Right/Left: Left Pain - part of body: Leg     Time: 7939-0300 PT Time Calculation (min) (ACUTE ONLY): 25 min  Charges:  $Therapeutic Activity: 23-37 mins                     Rey Fors B. [name] PT, DPT Acute Rehabilitation Services Please use secure chat or  Call Office [phone]    [place] 10/29/2021, 4:38 PM

## 2021-10-29 NOTE — TOC Progression Note (Addendum)
Transition of Care [facility]) - Initial/Assessment Note    Patient Details  Name: [name] MRN: [id number] Date of Birth: 1965-11-26  Transition of Care [facility]) CM/SW Contact:    Milinda Antis, [place] Phone Number: 10/29/2021, 9:15 AM  Clinical Narrative:                 Patient continues to report being unable to sit for dialysis which is a barrier to discharge.  Patient wants to return home at discharge.  TOC will continue to follow.  Expected Discharge Plan: Skilled Nursing Facility Barriers to Discharge: Continued Medical Work up   Patient Goals and CMS Choice Patient states their goals for this hospitalization and ongoing recovery are:: To return home CMS Medicare.gov Compare Post Acute Care list provided to:: Patient Choice offered to / list presented to : NA  Expected Discharge Plan and Services Expected Discharge Plan: [place]   Discharge Planning Services: CM Consult   Living arrangements for the past 2 months: Single Family Home                                      Prior Living Arrangements/Services Living arrangements for the past 2 months: Single Family Home Lives with:: Adult Children Patient language and need for interpreter reviewed:: Yes Do you feel safe going back to the place where you live?: Yes      Need for Family Participation in Patient Care: Yes (Comment) Care giver support system in place?: Yes (comment) Current home services: DME ([employer], w/c) Criminal Activity/Legal Involvement Pertinent to Current Situation/Hospitalization: No - Comment as needed  Activities of Daily Living Home Assistive Devices/Equipment: Eyeglasses, Wheelchair ADL Screening (condition at time of admission) Patient's cognitive ability adequate to safely complete daily activities?: Yes Is the patient deaf or have difficulty hearing?: No Does the patient have difficulty seeing, even when wearing glasses/contacts?: No Does the patient have  difficulty concentrating, remembering, or making decisions?: No Patient able to express need for assistance with ADLs?: Yes Does the patient have difficulty dressing or bathing?: Yes Independently performs ADLs?: No Communication: Independent Dressing (OT): Independent Is this a change from baseline?: Pre-admission baseline Grooming: Independent Feeding: Independent Bathing: Needs assistance Is this a change from baseline?: Pre-admission baseline Toileting: Needs assistance Is this a change from baseline?: Pre-admission baseline In/Out Bed: Needs assistance, Dependent Is this a change from baseline?: Pre-admission baseline Walks in Home: Dependent Is this a change from baseline?: Pre-admission baseline Does the patient have difficulty walking or climbing stairs?: Yes Weakness of Legs: Both Weakness of Arms/Hands: Both  Permission Sought/Granted Permission sought to share information with : Case Manager, Family Supports Permission granted to share information with : Yes, Verbal Permission Granted              Emotional Assessment Appearance:: Appears stated age Attitude/Demeanor/Rapport: Engaged, Gracious Affect (typically observed): Accepting, Appropriate, Calm, Hopeful Orientation: : Oriented to Self, Oriented to Place, Oriented to  Time, Oriented to Situation Alcohol / Substance Use: Not Applicable Psych Involvement: No (comment)  Admission diagnosis:  Wound infection [T14.8XXA, L08.9] Abscess of left hip [L02.416] Patient Active Problem List   Diagnosis Date Noted   Wound dehiscence, surgical, sequela    Abscess of left hip 08/26/2021   History of CVA (cerebrovascular accident) 50/53/9767   Hardware complicating wound infection ([place])    Subacute osteomyelitis of left femur with abscess     Septic  arthritis of hip ([place]) 07/30/2021   Skin abscess L hip 07/28/2021   HCAP (healthcare-associated pneumonia) 07/27/2021   Hyperkalemia 93/81/8299   Metabolic acidemia  37/16/9678   Closed fracture of left distal femur ([place]) 07/22/2021   Type 2 diabetes mellitus with hyperlipidemia ([place]) 07/22/2021   Infection of superficial incisional surgical site after procedure 03/09/2020   Abscess    Hypoglycemia    Essential hypertension    Sleep disturbance    ESRD (end stage renal disease) on dialysis ([place]) 01/24/2020   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[place]) 01/23/2020   Hyponatremia    Constipation    Chronic osteomyelitis involving left ankle and foot ([place])    Ulcer of left foot with necrosis of bone ([place])    Wound infection 12/28/2019   History of [place] amputation of left foot ([place]) 12/25/2017   History of CVA (cerebrovascular accident) without residual deficits 07/04/2017   Cerebral thrombosis with cerebral infarction 04/08/2017   Right sided weakness 04/07/2017   Hyperhidrosis 09/01/2016   Migraine with aura and without status migrainosus, not intractable 07/28/2016   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[place]) 08/04/2014   CKD stage 3 due to type 2 diabetes mellitus ([place]) 06/27/2014   Vitamin D insufficiency 05/08/2014   Obesity (BMI 30.0-34.9) 05/08/2014   Unilateral amputation of left foot ([place]) 05/08/2014   Bursitis of left shoulder 02/14/2014   Neck pain 01/03/2014   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.23) 01/14/2013   Insomnia 08/04/2012   Diabetic neuropathy, painful ([place]) 08/04/2011   Anxiety and depression 05/16/2010   Female stress incontinence 11/01/2007   TOBACCO DEPENDENCE 04/30/2006   GASTROESOPHAGEAL REFLUX, NO ESOPHAGITIS 04/30/2006   Irritable bowel syndrome 04/30/2006   PCP:  [name], MD Pharmacy:   [facility], [place] [address] [place] [address] Phone: [phone] Fax: [phone]  [id number] - [place], [place] [address] [place] [address] Phone: [phone] Fax: [phone]     Social Determinants of Health (SDOH) Interventions    Readmission Risk Interventions    07/26/2021    3:15 PM  Readmission Risk Prevention Plan  Transportation Screening Complete  PCP or Specialist Appt within 3-5 Days Complete  HRI or Home Care Consult Complete  Palliative Care Screening Not Applicable  Medication Review (RN Care Manager) Referral to Pharmacy

## 2021-10-29 NOTE — Progress Notes (Signed)
Occupational Therapy Treatment Patient Details Name: [name] MRN: [id number] DOB: May 28, 1965 Today's Date: 10/29/2021   History of present illness 56 y.o. female presented to ED 6/26 from dialysis with increased bloody drainage from L hip wound. 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 s/p hardware removal of left hip with partial resection of tuft tissue and femure that were nonviable on 08/10/21 Discharged home 6/23. underwent L hip debridement and wound vac placement 6/28; +Left intertrochanteric non-union,  Fracture of left distal femur  PMH: hypertension, hyperlipidemia, ESRD on HD MWF, history of left BKA in 2021, depression/anxiety, stroke, tobacco use, T2DM,  insomnia, chronic pain syndrome,   OT comments  Patient received in supine and eager to get out of bed. Patient was able to get to EOB and performed trunk ROM exercises to address back pain and stiffness before attempting transfer. Patient asked to attempt transfer without sliding board but was unable to complete and setup was provided with board and min assist to get to chair. Patient performed UE HEP with red therapy band while seated up in chair. Acute OT to continue to follow.    Recommendations for follow up therapy are one component of a multi-disciplinary discharge planning process, led by the attending physician.  Recommendations may be updated based on patient status, additional functional criteria and insurance authorization.    Follow Up Recommendations  Skilled nursing-short term rehab (<3 hours/day)    Assistance Recommended at Discharge Intermittent Supervision/Assistance  Patient can return home with the following  A lot of help with bathing/dressing/bathroom;Assistance with cooking/housework;Assist for transportation;Help with stairs or ramp for entrance;Two people to help with walking and/or transfers   Equipment Recommendations  Other (comment) (TBD)    Recommendations for  Other Services      Precautions / Restrictions Precautions Precautions: Fall;Other (comment) Precaution Comments: L BKA (baseline) Restrictions Weight Bearing Restrictions: No LLE Weight Bearing: Weight bearing as tolerated Other Position/Activity Restrictions: L distal femur fx; per Dr [name] 8/10 pt can full WB       Mobility Bed Mobility Overal bed mobility: Needs Assistance Bed Mobility: Supine to Sit     Supine to sit: Modified independent (Device/Increase time)     General bed mobility comments: increased time and effort    Transfers Overall transfer level: Needs assistance Equipment used: Sliding board Transfers: Bed to chair/wheelchair/BSC            Lateral/Scoot Transfers: Min assist (bed to chair) General transfer comment: bed pad used to assist with sliding board transfer     Balance Overall balance assessment: Needs assistance Sitting-balance support: Feet supported Sitting balance-Leahy Scale: Good Sitting balance - Comments: no assistance with sitting balance                                   ADL either performed or assessed with clinical judgement   ADL Overall ADL's : Needs assistance/impaired                                       General ADL Comments: declined self care tasks    Extremity/Trunk Assessment              Vision       Perception     Praxis      Cognition Arousal/Alertness: Awake/alert Behavior During Therapy:  WFL for tasks assessed/performed Overall Cognitive Status: Within Functional Limits for tasks assessed                                 General Comments: eager to get into recliner        Exercises Exercises: General Upper Extremity General Exercises - Upper Extremity Shoulder Flexion: Strengthening, Both, 10 reps, Theraband, Seated Theraband Level (Shoulder Flexion): Level 2 (Red) Shoulder ABduction: Strengthening, Both, 15 reps, Theraband, Seated Theraband  Level (Shoulder Abduction): Level 2 (Red) Elbow Flexion: Strengthening, Both, 10 reps, Theraband, Seated Theraband Level (Elbow Flexion): Level 2 (Red) Elbow Extension: Strengthening, Both, 10 reps, Theraband, Seated Theraband Level (Elbow Extension): Level 2 (Red)    Shoulder Instructions       General Comments      Pertinent Vitals/ Pain       Pain Assessment Pain Assessment: Faces Faces Pain Scale: Hurts a little bit Pain Location: back Pain Descriptors / Indicators: Sore Pain Intervention(s): Monitored during session, Repositioned  Home Living                                          Prior Functioning/Environment              Frequency  Min 2X/week        Progress Toward Goals  OT Goals(current goals can now be found in the care plan section)  Progress towards OT goals: Progressing toward goals  Acute Rehab OT Goals Patient Stated Goal: get better OT Goal Formulation: With patient Time For Goal Achievement: 11/05/21 Potential to Achieve Goals: Fair ADL Goals Pt Will Perform Grooming: with modified independence;sitting Pt Will Perform Lower Body Bathing: with supervision;sitting/lateral leans;with adaptive equipment Pt Will Perform Lower Body Dressing: with adaptive equipment;sitting/lateral leans;with supervision Pt Will Transfer to Toilet: with min assist;with +2 assist;bedside commode;with transfer board Pt Will Perform Toileting - Clothing Manipulation and hygiene: with min assist;sitting/lateral leans Pt/caregiver will Perform Home Exercise Program: Increased ROM;Increased strength;Both right and left upper extremity;With written HEP provided;Independently;With theraband Additional ADL Goal #1: Pt will perform bed mobility modified independently in preparation for ADLs with HOB flat. Additional ADL Goal #2: Pt will be able to don prosthesis with min A in prep for ADLs/mobility  Plan Discharge plan remains appropriate;Frequency remains  appropriate    Co-evaluation                 AM-PAC OT "6 Clicks" Daily Activity     Outcome Measure   Help from another person eating meals?: None Help from another person taking care of personal grooming?: None Help from another person toileting, which includes using toliet, bedpan, or urinal?: A Lot Help from another person bathing (including washing, rinsing, drying)?: A Lot Help from another person to put on and taking off regular upper body clothing?: A Little Help from another person to put on and taking off regular lower body clothing?: A Lot 6 Click Score: 17    End of Session Equipment Utilized During Treatment: Other (comment) (sliding board)  OT Visit Diagnosis: Muscle weakness (generalized) (M62.81);Unsteadiness on feet (R26.81);History of falling (Z91.81)   Activity Tolerance Patient tolerated treatment well   Patient Left in chair;with call bell/phone within reach   Nurse Communication Mobility status        Time: 2924-4628 OT Time Calculation (min): 35  min  Charges: OT General Charges $OT Visit: 1 Visit OT Treatments $Therapeutic Activity: 8-22 mins $Therapeutic Exercise: 8-22 mins  [name], OTA Acute Rehabilitation Services  Office [phone]   [name] 10/29/2021, 2:34 PM

## 2021-10-29 NOTE — Progress Notes (Signed)
[facility] Progress Note   Subjective:   Tolerated HD yesterday with net UF 3.5L. Denies SOB, CP, dizziness, abdominal pain and nausea. Reports L hip is sore today.   Objective Vitals:   10/28/21 1314 10/28/21 1724 10/28/21 2228 10/29/21 0632  BP: (!) 151/64 121/61 131/66 (!) 141/58  Pulse: 62 61 (!) 58 (!) 58  Resp: 17 18 18 12   Temp: 98.3 F (36.8 C) 98.6 F (37 C) 98.5 F (36.9 C) 98 F (36.7 C)  TempSrc:   Oral Oral  SpO2: 98% 96% 97% 99%  Weight:      Height:       Physical Exam General: [name] Heart: RRR, no murmurs, rubs or gallops Lungs: CTA bilaterally, respirations unlabored Abdomen: Soft, non-tender, non-distended, +BS Extremities: L BKA 1+ edema, no edema RLE Dialysis Access:  LUE AVF + bruit  Additional Objective Labs: Basic Metabolic Panel: Recent Labs  Lab 10/23/21 0556 10/25/21 0800  NA 126* 124*  K 5.1 5.0  CL 88* 91*  CO2 26 25  GLUCOSE 81 125*  BUN 50* 50*  CREATININE 6.27* 5.63*  CALCIUM 9.3 8.7*  PHOS 6.9* 6.3*   Liver Function Tests: Recent Labs  Lab 10/23/21 0556 10/25/21 0800  ALBUMIN 3.1* 2.9*   No results for input(s): "LIPASE", "AMYLASE" in the last 168 hours. CBC: Recent Labs  Lab 10/23/21 0556 10/25/21 0800  WBC 6.4 6.5  NEUTROABS 3.0  --   HGB 12.0 12.2  HCT 37.2 37.5  MCV 86.9 87.6  PLT 194 197   Blood Culture    Component Value Date/Time   SDES TISSUE 08/28/2021 0917   SPECREQUEST LEFT THIGH TISSUE 08/28/2021 0917   CULT  08/28/2021 0917    MODERATE ENTEROCOCCUS FAECIUM VANCOMYCIN RESISTANT ENTEROCOCCUS ISOLATED NO ANAEROBES ISOLATED Sent to [facility] for further susceptibility testing. Performed at [facility], [place] [address], [place], [place] [address]    REPTSTATUS 09/02/2021 FINAL 08/28/2021 6213    Cardiac Enzymes: No results for input(s): "CKTOTAL", "CKMB", "CKMBINDEX", "TROPONINI" in the last 168 hours. CBG: Recent Labs  Lab 10/28/21 0735 10/28/21 1313  10/28/21 1722 10/28/21 2231 10/29/21 0726  GLUCAP 133* 141* 231* 101* 175*   Iron Studies: No results for input(s): "IRON", "TIBC", "TRANSFERRIN", "FERRITIN" in the last 72 hours. @lablastinr3 @ Studies/Results: No results found. Medications:  sodium chloride     sodium chloride 10 mL/hr at 09/19/21 1310   methocarbamol (ROBAXIN) IV      alosetron  1 mg Oral BID   amitriptyline  25 mg Oral QHS   amLODipine  10 mg Oral Q24H   atorvastatin  40 mg Oral q1800   carvedilol  6.25 mg Oral BID   Chlorhexidine Gluconate Cloth  6 each Topical Q0600   cyclobenzaprine  10 mg Oral TID   docusate sodium  100 mg Oral BID   DULoxetine  60 mg Oral Daily   famotidine  20 mg Oral Daily   furosemide  80 mg Oral Daily   heparin  5,000 Units Subcutaneous Q8H   hydrALAZINE  25 mg Oral TID   insulin aspart  0-6 Units Subcutaneous TID WC   insulin aspart  3 Units Subcutaneous TID WC   insulin glargine-yfgn  5 Units Subcutaneous QHS   multivitamin  1 tablet Oral QHS   pantoprazole  80 mg Oral Daily   sevelamer carbonate  2,400 mg Oral TID WC   sodium chloride flush  3 mL Intravenous Q12H    Outpatient Dialysis  Orders: MWF at [facility], 400/500, EDW 90kg, 2K/2Ca, LUE AVF, heparin 4000 unit bolus  Assessment/Plan:  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 Back to OR 08/28/21, 08/31/21 for L hip debrid. and wound vac placement (now removed). Finished 6 week course of IV Daptomycin on 10/12/21. Reports hip is more sore today. Management per primary team 2. ESRD: MWF schedule. Was having intermittent cannulation issues but AVF now working well.  3. AVF Access: S/p branch ligation in OR 09/19/2021 per Dr. Virl Cagey.  4. HTN/ volume: BP controlled. On amlodipine/hydralazine/Coreg. Still has edema but improved. Max UF as tolerated and has been reminded of importance of following fluid and sodium restrictions.   5. Anemia of ESRD: Hgb 12.2 Not on ESA. Needs updated labs, ordered  with HD tomorrow 6. Secondary HPTH: Cca controlled. Phos 6.3. Continue Renvela as binder-increased dose. No VDRA for now. 7. DM2: Insulin per primary 8. Dispo: SNF recommended but patient now wants to go home. Refused to sit in a recliner here but thinks she could tolerate it outpatient, I agree that there are no major physical barriers to her sitting in a chair. Transport in and out of HD center also an issue    Anice Paganini, PA-C 10/29/2021, 8:52 AM  [employer] Pager: [phone]

## 2021-10-30 DIAGNOSIS — M86252 Subacute osteomyelitis, left femur: Secondary | ICD-10-CM | POA: Diagnosis not present

## 2021-10-30 DIAGNOSIS — I10 Essential (primary) hypertension: Secondary | ICD-10-CM | POA: Diagnosis not present

## 2021-10-30 DIAGNOSIS — N186 End stage renal disease: Secondary | ICD-10-CM | POA: Diagnosis not present

## 2021-10-30 DIAGNOSIS — F419 Anxiety disorder, unspecified: Secondary | ICD-10-CM | POA: Diagnosis not present

## 2021-10-30 LAB — RENAL FUNCTION PANEL
Albumin: 2.9 g/dL — ABNORMAL LOW (ref 3.5–5.0)
Anion gap: 11 (ref 5–15)
BUN: 72 mg/dL — ABNORMAL HIGH (ref 6–20)
CO2: 24 mmol/L (ref 22–32)
Calcium: 9 mg/dL (ref 8.9–10.3)
Chloride: 92 mmol/L — ABNORMAL LOW (ref 98–111)
Creatinine, Ser: 6.7 mg/dL — ABNORMAL HIGH (ref 0.44–1.00)
GFR, Estimated: 7 mL/min — ABNORMAL LOW (ref >60)
Glucose, Bld: 127 mg/dL — ABNORMAL HIGH (ref 70–99)
Phosphorus: 5.8 mg/dL — ABNORMAL HIGH (ref 2.5–4.6)
Potassium: 5.8 mmol/L — ABNORMAL HIGH (ref 3.5–5.1)
Sodium: 127 mmol/L — ABNORMAL LOW (ref 135–145)

## 2021-10-30 LAB — GLUCOSE, CAPILLARY
Glucose-Capillary: 126 mg/dL — ABNORMAL HIGH (ref 70–99)
Glucose-Capillary: 158 mg/dL — ABNORMAL HIGH (ref 70–99)
Glucose-Capillary: 105 mg/dL — ABNORMAL HIGH (ref 70–99)
Glucose-Capillary: 222 mg/dL — ABNORMAL HIGH (ref 70–99)

## 2021-10-30 LAB — CBC
HCT: 35.5 % — ABNORMAL LOW (ref 36.0–46.0)
Hemoglobin: 11.5 g/dL — ABNORMAL LOW (ref 12.0–15.0)
MCH: 28.4 pg (ref 26.0–34.0)
MCHC: 32.4 g/dL (ref 30.0–36.0)
MCV: 87.7 fL (ref 80.0–100.0)
Platelets: 198 10*3/uL (ref 150–400)
RBC: 4.05 MIL/uL (ref 3.87–5.11)
RDW: 15.1 % (ref 11.5–15.5)
WBC: 5.9 10*3/uL (ref 4.0–10.5)
nRBC: 0 % (ref 0.0–0.2)

## 2021-10-30 MED ORDER — HEPARIN SODIUM (PORCINE) 1000 UNIT/ML IJ SOLN
INTRAMUSCULAR | Status: AC
  Filled 2021-10-30: qty 4

## 2021-10-30 NOTE — Progress Notes (Signed)
Contacted by renal PA regarding whether pt's clinic staff can bring pt a wheelchair out to pt's car when pt arrives for out-pt HD. Spoke to clinic manager via phone to discuss pt's case. Navigator advised that clinic staff would be able to take a w/c out to pt's car but staff unable to assist pt with transferring from car to w/c and from w/c to car. Pt must be able to transfer self. Clinic manager states that staff can assist with transfer from w/c to HD chair since pt will be in the clinic. Clinic manger also states that pt must receive HD in chair while inpt prior to clinic being able to resume care for pt. Met with pt at bedside. Pt advised that staff can bring w/c out to car when pt arrives but pt will have to be able to transfer self due to staff being unable to assist pt while outside of clinic for liability reasons. Pt also advised that she will have to receive HD in the chair while inpt prior to d/c. Pt voices understanding to the above. Inquired of pt if she would be willing to receive HD in chair today. Pt is not willing to attempt today. Inquired of pt if she would try on Friday. Pt states it depends on how she is doing that day. Pt advised navigator that pt does have a ramp at home of which pt is paying to rent. Inquired of pt if she would consider snf where transportation to out-pt HD would be provided by the faciltiy. Pt states she wants to return home. Inquired of pt if daughter could assist pt with transportation to/from HD for the first week or so to make sure gets to HD appts and to assist pt from d/c from hospital. Pt states she will have to discuss with daughter. Update provided to renal PA and TOC staff via secure chat.   Melven Sartorius Renal Navigator [phone]

## 2021-10-30 NOTE — Progress Notes (Signed)
Received patient in bed to unit.  Alert and oriented.  Informed consent signed and in chart.   Treatment initiated: 1351 Treatment completed: 6168  Patient tolerated well.  Transported back to the room  Alert, without acute distress.  Hand-off given to patient's nurse.   Access used: Avfistula Access issues: none  Total UF removed: 2000 Medication(s) given:  Post HD VS: 98.1,131/60,94%, Post HD weight: 93.1kg   [facility]

## 2021-10-30 NOTE — Progress Notes (Signed)
[facility] Progress Note   Subjective:    Discussed dispo at length today. Patient feels she could easily sit in recliner for HD but transport sometimes takes too long here and she has to sit for 6-8 hours. She also reports wanting to go home. She feels she could drive herself to HD and get in and out of a wheelchair if they bring one to her car. Noted PT is recommending SNF and says patient cannot be transported by private vehicle.   Pt reports no new complaints today. Denies SOB, CP, dizziness, abdominal pain and nausea.   Objective Vitals:   10/29/21 0905 10/29/21 1654 10/29/21 2100 10/30/21 0558  BP: (!) 154/80 (!) 147/60 (!) 138/58 (!) 155/57  Pulse: 65 (!) 57 60 60  Resp: 18 18  18   Temp: 98 F (36.7 C) 98.2 F (36.8 C) 98.3 F (36.8 C) 98.1 F (36.7 C)  TempSrc: Oral Oral Oral Oral  SpO2: 99% 97% 96% 97%  Weight:      Height:       Physical Exam General: [name] Heart: RRR, no murmurs, rubs or gallops Lungs: CTA bilaterally, respirations unlabored Abdomen: Soft, non-tender, non-distended, +BS Extremities: L BKA 1+ edema, no edema RLE Dialysis Access:  LUE AVF + bruit  Additional Objective Labs: Basic Metabolic Panel: Recent Labs  Lab 10/25/21 0800  NA 124*  K 5.0  CL 91*  CO2 25  GLUCOSE 125*  BUN 50*  CREATININE 5.63*  CALCIUM 8.7*  PHOS 6.3*   Liver Function Tests: Recent Labs  Lab 10/25/21 0800  ALBUMIN 2.9*   No results for input(s): "LIPASE", "AMYLASE" in the last 168 hours. CBC: Recent Labs  Lab 10/25/21 0800  WBC 6.5  HGB 12.2  HCT 37.5  MCV 87.6  PLT 197   Blood Culture    Component Value Date/Time   SDES TISSUE 08/28/2021 0917   SPECREQUEST LEFT THIGH TISSUE 08/28/2021 0917   CULT  08/28/2021 0917    MODERATE ENTEROCOCCUS FAECIUM VANCOMYCIN RESISTANT ENTEROCOCCUS ISOLATED NO ANAEROBES ISOLATED Sent to [place] for further susceptibility testing. Performed at [facility], [place] [address], [place], [place] [address]    REPTSTATUS 09/02/2021 FINAL 08/28/2021 6045    Cardiac Enzymes: No results for input(s): "CKTOTAL", "CKMB", "CKMBINDEX", "TROPONINI" in the last 168 hours. CBG: Recent Labs  Lab 10/29/21 0726 10/29/21 1152 10/29/21 1655 10/29/21 2302 10/30/21 0733  GLUCAP 175* 178* 168* 136* 126*   Iron Studies: No results for input(s): "IRON", "TIBC", "TRANSFERRIN", "FERRITIN" in the last 72 hours. @lablastinr3 @ Studies/Results: No results found. Medications:  sodium chloride     sodium chloride 10 mL/hr at 09/19/21 1310   methocarbamol (ROBAXIN) IV      alosetron  1 mg Oral BID   amitriptyline  25 mg Oral QHS   amLODipine  10 mg Oral Q24H   atorvastatin  40 mg Oral q1800   carvedilol  6.25 mg Oral BID   cyclobenzaprine  10 mg Oral TID   docusate sodium  100 mg Oral BID   DULoxetine  60 mg Oral Daily   famotidine  20 mg Oral Daily   furosemide  80 mg Oral Daily   heparin  5,000 Units Subcutaneous Q8H   hydrALAZINE  25 mg Oral TID   insulin aspart  0-6 Units Subcutaneous TID WC   insulin aspart  3 Units Subcutaneous TID WC   insulin glargine-yfgn  5 Units Subcutaneous QHS   multivitamin  1 tablet Oral  QHS   pantoprazole  80 mg Oral Daily   sevelamer carbonate  2,400 mg Oral TID WC   sodium chloride flush  3 mL Intravenous Q12H    Outpatient Dialysis Orders: MWF at [facility], 400/500, EDW 90kg, 2K/2Ca, LUE AVF, heparin 4000 unit bolus  Assessment/Plan: 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 Back to OR 08/28/21, 08/31/21 for L hip debrid. and wound vac placement (now removed). Finished 6 week course of IV Daptomycin on 10/12/21. Reports hip is more sore today. Management per primary team 2. ESRD: MWF schedule. Was having intermittent cannulation issues but AVF now working well.  3. AVF Access: S/p branch ligation in OR 09/19/2021 per Dr. Virl Cagey.  4. HTN/ volume: BP controlled. On amlodipine/hydralazine/Coreg. Still has  edema but improved. Max UF as tolerated and has been reminded of importance of following fluid and sodium restrictions.   5. Anemia of ESRD: Hgb 12.2 Not on ESA. Needs updated labs, ordered with HD today 6. Secondary HPTH: Cca controlled. Phos 6.3. Continue Renvela as binder-increased dose. No VDRA for now. Ordered labs with HD today.  7. DM2: Insulin per primary 8. Dispo: SNF recommended but patient now wants to go home. Refused to sit in a recliner here but thinks she could tolerate it outpatient, I agree that there are no major physical barriers to her sitting in a chair. Transport in and out of HD center also an issue, see above  Anice Paganini, PA-C 10/30/2021, 9:14 AM  [employer] Pager: [phone]

## 2021-10-30 NOTE — Progress Notes (Addendum)
PROGRESS NOTE    [name]  JKD:[id number] DOB: [birth year] DOA: 08/26/2021 PCP: [name], MD   Brief Narrative: [name] is a 56 y.o. female with a history of ESRD, anemia of CKD, diabetes mellitus type 2, hyperlipidemia, CVA, anxiety, depression, GERD, tobacco, left hip fracture s/p IM nail placement and recent hardware removal. Patient presented secondary to left hip bleeding and found to have evidence of subacute osteomyelitis and abscess of left femur. Orthopedic surgery/infectious disease consulted. Patient was started on empiric antibiotics and underwent multiple I&Ds. Patient completed a 6 week course of daptomycin. Discharge is pending ability for patient to tolerate HD in a chair which is complicated by patient's refusal/inability to sit in a chair secondary to pain.   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 Orthopedic surgery consulted. ID consulted. Patient underwent hardware removal on 6/10 (prior to admission) with I&D on 6/28 and 7/1. Infectious disease recommendations for 6 weeks of Daptomycin; patient has completed course.  History of left intertrochanteric fracture This was previously managed with IM nail placement in 2021. Hardware removed secondary to above.  ESRD on HD Nephrology on board. -HD per nephrology  Anasarca Improved with hemodialysis.  Hyponatremia Management with hemodialysis.  Hypokalemia Management with hemodialysis  Hyperkalemia Management with hemodialysis  Anemia of chronic disease Acute blood loss anemia In setting of CKD in addition to blood loss from wound. Patient has received a total of 2 units of PRBC via transfusion. Hemoglobin stable.  Diabetes mellitus type 2 Diabetes is uncontrolled with hyperglycemia. Most recent hemoglobin A1C of 7.1%. Patient is on insulin glargine and insulin aspart as an  outpatient. -Continue Semglee 5 units qHS, Novolog 3 units TID with meals and SSI  Primary hypertension -Continue amlodipine, hydralazine and Coreg  Hyperlipidemia History of CVA -Continue Lipitor  GERD -Continue Pepcid and Protonix  Tobacco use Tobacco cessation discussed during this admission.  DVT prophylaxis: Heparin subq Code Status:   Code Status: Full Code Family Communication: None at bedside Disposition Plan: Discharge to SNF pending ability to tolerate HD sitting up   Consultants:  Orthopedic surgery Infectious disease Nephrology Vascular surgery  Procedures:  Vascular ultrasound duplex dialysis access 09/07/2021 Fistula revision branch ligation per Dr. Unk Lightning 09/19/2021- Left hip debridement, application of wound VAC by Dr. [name] 08/28/2021  Antimicrobials: Daptomycin Cefazolin    Subjective: Patient reports not wanting to sit up in dialysis because of having to stay in HD for 6-8 hours at one time. Patient states her pain is also contributing to her inability to tolerating sitting up. Patient reports she is concerned if the infection will return.   Objective: BP 131/60   Pulse 68   Temp 98.2 F (36.8 C)   Resp 13   Ht 5\' 10"  (1.778 m)   Wt 93.1 kg   SpO2 94%   BMI 29.45 kg/m   Examination:  General exam: Appears calm and comfortable Respiratory system: Clear to auscultation. Respiratory effort normal. Cardiovascular system: S1 & S2 heard, RRR. No murmurs, rubs, gallops or clicks. Gastrointestinal system: Abdomen is nondistended, soft and nontender. Normal bowel sounds heard. Central nervous system: Alert and oriented. No focal neurological deficits. Skin: No cyanosis. No rashes Psychiatry: Judgement and insight appear normal. Mood & affect appropriate.    Data Reviewed: I have personally reviewed following labs and imaging studies  CBC Lab Results  Component Value Date   WBC 5.9 10/30/2021  RBC 4.05 10/30/2021   HGB 11.5 (L) 10/30/2021    HCT 35.5 (L) 10/30/2021   MCV 87.7 10/30/2021   MCH 28.4 10/30/2021   PLT 198 10/30/2021   MCHC 32.4 10/30/2021   RDW 15.1 10/30/2021   LYMPHSABS 2.0 10/23/2021   MONOABS 1.1 (H) 10/23/2021   EOSABS 0.3 10/23/2021   BASOSABS 0.1 73/41/9379     Last metabolic panel Lab Results  Component Value Date   NA 127 (L) 10/30/2021   K 5.8 (H) 10/30/2021   CL 92 (L) 10/30/2021   CO2 24 10/30/2021   BUN 72 (H) 10/30/2021   CREATININE 6.70 (H) 10/30/2021   GLUCOSE 127 (H) 10/30/2021   GFRNONAA 7 (L) 10/30/2021   GFRAA 13 (L) 12/17/2018   CALCIUM 9.0 10/30/2021   PHOS 5.8 (H) 10/30/2021   PROT 6.6 10/02/2021   ALBUMIN 2.9 (L) 10/30/2021   LABGLOB 3.1 10/29/2018   AGRATIO 1.4 10/29/2018   BILITOT 0.5 10/02/2021   ALKPHOS 112 10/02/2021   AST 20 10/02/2021   ALT 14 10/02/2021   ANIONGAP 11 10/30/2021    GFR: Estimated Creatinine Clearance: 11.9 mL/min (A) (by C-G formula based on SCr of 6.7 mg/dL (H)).  No results found for this or any previous visit (from the past 240 hour(s)).    Radiology Studies: No results found.    LOS: 65 days    Cordelia Poche, MD [facility] 10/30/2021, 4:35 PM   If 7PM-7AM, please contact night-coverage [email or web address]

## 2021-10-31 DIAGNOSIS — I10 Essential (primary) hypertension: Secondary | ICD-10-CM | POA: Diagnosis not present

## 2021-10-31 DIAGNOSIS — N186 End stage renal disease: Secondary | ICD-10-CM | POA: Diagnosis not present

## 2021-10-31 DIAGNOSIS — M86252 Subacute osteomyelitis, left femur: Secondary | ICD-10-CM | POA: Diagnosis not present

## 2021-10-31 DIAGNOSIS — F419 Anxiety disorder, unspecified: Secondary | ICD-10-CM | POA: Diagnosis not present

## 2021-10-31 LAB — GLUCOSE, CAPILLARY
Glucose-Capillary: 119 mg/dL — ABNORMAL HIGH (ref 70–99)
Glucose-Capillary: 160 mg/dL — ABNORMAL HIGH (ref 70–99)
Glucose-Capillary: 159 mg/dL — ABNORMAL HIGH (ref 70–99)
Glucose-Capillary: 149 mg/dL — ABNORMAL HIGH (ref 70–99)

## 2021-10-31 MED ORDER — CHLORHEXIDINE GLUCONATE CLOTH 2 % EX PADS
6.0000 | MEDICATED_PAD | Freq: Every day | CUTANEOUS | Status: DC
  Administered 2021-10-31: 6 via TOPICAL

## 2021-10-31 NOTE — Progress Notes (Signed)
PROGRESS NOTE    [name]  ACZ:[id number] DOB: [birth year] DOA: 08/26/2021 PCP: [name], MD   Brief Narrative: [name] is a 56 y.o. female with a history of ESRD, anemia of CKD, diabetes mellitus type 2, hyperlipidemia, CVA, anxiety, depression, GERD, tobacco, left hip fracture s/p IM nail placement and recent hardware removal. Patient presented secondary to left hip bleeding and found to have evidence of subacute osteomyelitis and abscess of left femur. Orthopedic surgery/infectious disease consulted. Patient was started on empiric antibiotics and underwent multiple I&Ds. Patient completed a 6 week course of daptomycin. Discharge is pending ability for patient to tolerate HD in a chair which is complicated by patient's refusal/inability to sit in a chair secondary to pain.   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 Orthopedic surgery consulted. ID consulted. Patient underwent hardware removal on 6/10 (prior to admission) with I&D on 6/28 and 7/1. Infectious disease recommendations for 6 weeks of Daptomycin; patient has completed course.  History of left intertrochanteric fracture This was previously managed with IM nail placement in 2021. Hardware removed secondary to above.  ESRD on HD Nephrology on board. -HD per nephrology  Anasarca Improved with hemodialysis.  Hyponatremia Management with hemodialysis.  Hypokalemia Management with hemodialysis  Hyperkalemia Management with hemodialysis  Anemia of chronic disease Acute blood loss anemia In setting of CKD in addition to blood loss from wound. Patient has received a total of 2 units of PRBC via transfusion. Hemoglobin stable.  Diabetes mellitus type 2 Diabetes is uncontrolled with hyperglycemia. Most recent hemoglobin A1C of 7.1%. Patient is on insulin glargine and insulin aspart as an  outpatient. -Continue Semglee 5 units qHS, Novolog 3 units TID with meals and SSI  Primary hypertension -Continue amlodipine, hydralazine and Coreg  Hyperlipidemia History of CVA -Continue Lipitor  GERD -Continue Pepcid and Protonix  Tobacco use Tobacco cessation discussed during this admission.  DVT prophylaxis: Heparin subq Code Status:   Code Status: Full Code Family Communication: None at bedside Disposition Plan: Discharge to SNF pending ability to tolerate HD sitting up   Consultants:  Orthopedic surgery Infectious disease Nephrology Vascular surgery  Procedures:  Vascular ultrasound duplex dialysis access 09/07/2021 Fistula revision branch ligation per Dr. Unk Lightning 09/19/2021- Left hip debridement, application of wound VAC by Dr. [name] 08/28/2021  Antimicrobials: Daptomycin Cefazolin    Subjective: Patient states she plans on sitting up with HD tomorrow, 9/1. No specific concerns this morning.  Objective: BP (!) 137/54 (BP Location: Right Arm)   Pulse 61   Temp 98.6 F (37 C) (Oral)   Resp 20   Ht 5\' 10"  (1.778 m)   Wt 93.1 kg   SpO2 99%   BMI 29.45 kg/m   Examination:  General exam: Appears calm and comfortable Respiratory system: Clear to auscultation. Respiratory effort normal. Cardiovascular system: S1 & S2 heard, RRR. Gastrointestinal system: Abdomen is nondistended, soft and nontender. Normal bowel sounds heard. Central nervous system: Alert and oriented. No focal neurological deficits. Psychiatry: Judgement and insight appear normal. Mood & affect appropriate.    Data Reviewed: I have personally reviewed following labs and imaging studies  CBC Lab Results  Component Value Date   WBC 5.9 10/30/2021   RBC 4.05 10/30/2021   HGB 11.5 (L) 10/30/2021   HCT 35.5 (L) 10/30/2021   MCV 87.7 10/30/2021   MCH 28.4 10/30/2021   PLT 198 10/30/2021   MCHC 32.4 10/30/2021  RDW 15.1 10/30/2021   LYMPHSABS 2.0 10/23/2021   MONOABS 1.1 (H)  10/23/2021   EOSABS 0.3 10/23/2021   BASOSABS 0.1 16/12/9602     Last metabolic panel Lab Results  Component Value Date   NA 127 (L) 10/30/2021   K 5.8 (H) 10/30/2021   CL 92 (L) 10/30/2021   CO2 24 10/30/2021   BUN 72 (H) 10/30/2021   CREATININE 6.70 (H) 10/30/2021   GLUCOSE 127 (H) 10/30/2021   GFRNONAA 7 (L) 10/30/2021   GFRAA 13 (L) 12/17/2018   CALCIUM 9.0 10/30/2021   PHOS 5.8 (H) 10/30/2021   PROT 6.6 10/02/2021   ALBUMIN 2.9 (L) 10/30/2021   LABGLOB 3.1 10/29/2018   AGRATIO 1.4 10/29/2018   BILITOT 0.5 10/02/2021   ALKPHOS 112 10/02/2021   AST 20 10/02/2021   ALT 14 10/02/2021   ANIONGAP 11 10/30/2021    GFR: Estimated Creatinine Clearance: 11.9 mL/min (A) (by C-G formula based on SCr of 6.7 mg/dL (H)).  No results found for this or any previous visit (from the past 240 hour(s)).    Radiology Studies: No results found.    LOS: 66 days    Cordelia Poche, MD [facility] 10/31/2021, 12:27 PM   If 7PM-7AM, please contact night-coverage [email or web address]

## 2021-10-31 NOTE — Progress Notes (Signed)
[facility] Progress Note   Subjective:   No new complaints today. Agrees to try HD in recliner tomorrow. She denies SOB, CP, dizziness, abdominal pain and nausea.   Objective Vitals:   10/30/21 1544 10/30/21 1651 10/30/21 2051 10/31/21 0517  BP: 131/60 (!) 140/58 133/65 (!) 131/52  Pulse: 68 60 61 60  Resp: 13 16 18 18   Temp: 98.2 F (36.8 C) 98.6 F (37 C) 98.3 F (36.8 C) 97.9 F (36.6 C)  TempSrc:  Axillary  Oral  SpO2: 94% 96% 98% 100%  Weight: 93.1 kg     Height:       Physical Exam General: [name] Heart: RRR, no murmurs, rubs or gallops Lungs: CTA bilaterally, respirations unlabored Abdomen: Soft, non-tender, non-distended, +BS Extremities: L BKA 1+ edema, no edema RLE Dialysis Access:  LUE AVF + bruit  Additional Objective Labs: Basic Metabolic Panel: Recent Labs  Lab 10/25/21 0800 10/30/21 1347  NA 124* 127*  K 5.0 5.8*  CL 91* 92*  CO2 25 24  GLUCOSE 125* 127*  BUN 50* 72*  CREATININE 5.63* 6.70*  CALCIUM 8.7* 9.0  PHOS 6.3* 5.8*   Liver Function Tests: Recent Labs  Lab 10/25/21 0800 10/30/21 1347  ALBUMIN 2.9* 2.9*   No results for input(s): "LIPASE", "AMYLASE" in the last 168 hours. CBC: Recent Labs  Lab 10/25/21 0800 10/30/21 1347  WBC 6.5 5.9  HGB 12.2 11.5*  HCT 37.5 35.5*  MCV 87.6 87.7  PLT 197 198   Blood Culture    Component Value Date/Time   SDES TISSUE 08/28/2021 0917   SPECREQUEST LEFT THIGH TISSUE 08/28/2021 0917   CULT  08/28/2021 0917    MODERATE ENTEROCOCCUS FAECIUM VANCOMYCIN RESISTANT ENTEROCOCCUS ISOLATED NO ANAEROBES ISOLATED Sent to [place] for further susceptibility testing. Performed at [facility], [place] [address], [place], [place] [address]    REPTSTATUS 09/02/2021 FINAL 08/28/2021 6812    Cardiac Enzymes: No results for input(s): "CKTOTAL", "CKMB", "CKMBINDEX", "TROPONINI" in the last 168 hours. CBG: Recent Labs  Lab 10/30/21 0733 10/30/21 1131 10/30/21 1649  10/30/21 2101 10/31/21 0734  GLUCAP 126* 158* 105* 222* 119*   Iron Studies: No results for input(s): "IRON", "TIBC", "TRANSFERRIN", "FERRITIN" in the last 72 hours. @lablastinr3 @ Studies/Results: No results found. Medications:  sodium chloride     sodium chloride 10 mL/hr at 09/19/21 1310   methocarbamol (ROBAXIN) IV      alosetron  1 mg Oral BID   amitriptyline  25 mg Oral QHS   amLODipine  10 mg Oral Q24H   atorvastatin  40 mg Oral q1800   carvedilol  6.25 mg Oral BID   cyclobenzaprine  10 mg Oral TID   docusate sodium  100 mg Oral BID   DULoxetine  60 mg Oral Daily   famotidine  20 mg Oral Daily   furosemide  80 mg Oral Daily   heparin  5,000 Units Subcutaneous Q8H   hydrALAZINE  25 mg Oral TID   insulin aspart  0-6 Units Subcutaneous TID WC   insulin aspart  3 Units Subcutaneous TID WC   insulin glargine-yfgn  5 Units Subcutaneous QHS   multivitamin  1 tablet Oral QHS   pantoprazole  80 mg Oral Daily   sevelamer carbonate  2,400 mg Oral TID WC   sodium chloride flush  3 mL Intravenous Q12H    Outpatient Dialysis Orders: MWF at [place], 400/500, EDW 90kg, 2K/2Ca, LUE AVF, heparin 4000 unit bolus  Assessment/Plan:  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 Back to OR 08/28/21, 08/31/21 for L hip debrid. and wound vac placement (now removed). Finished 6 week course of IV Daptomycin on 10/12/21. Management per primary team 2. ESRD: MWF schedule. Was having intermittent cannulation issues but AVF now working well. K+ elevated pre-HD, reviewed low potassium diet.  3. AVF Access: S/p branch ligation in OR 09/19/2021 per Dr. Virl Cagey.  4. HTN/ volume: BP controlled. On amlodipine/hydralazine/Coreg. Still has edema but improved. Max UF as tolerated and has been reminded of importance of following fluid and sodium restrictions.   5. Anemia of ESRD: Hgb at goal, not on ESA.  6. Secondary HPTH: Cca controlled. Phos improving on increased renvela dose.  No VDRA for now. Ordered labs with HD today.  7. DM2: Insulin per primary 8. Dispo: SNF recommended but patient now wants to go home. Refused to sit in a recliner here but thinks she could tolerate it outpatient, I agree that there are no major physical barriers to her sitting in a chair and she agrees to try it tomorrow. Appreciate SW assistance, please see renal navigator notes.   Anice Paganini, PA-C 10/31/2021, 8:25 AM  [facility] Pager: [phone]

## 2021-10-31 NOTE — Progress Notes (Signed)
OT Cancellation Note  Patient Details Name: [name] MRN: [id number] DOB: 03/02/1966   Cancelled Treatment:    Reason Eval/Treat Not Completed: Patient declined, no reason specified (Patient stated she is not feeling well, nauseous. Asked therapist to try again later.  Will attempt again as schedule permits) [name], [place]  Office [place] 10/31/2021, 12:55 PM

## 2021-10-31 NOTE — Progress Notes (Signed)
PT Cancellation Note  Patient Details Name: [name] MRN: [id number] DOB: [birth year]   Cancelled Treatment:    Reason Eval/Treat Not Completed: Medical issues which prohibited therapy  Patient refusing due to nausea and dry heaves. "I don't want to get you sick...not today."  NOted pt has been discussing going home instead of going to SNF with her care providers. Briefly discussed the barriers to going home (she cannot yet do a level sliding board transfer by herself, and to get back into car she will need to be able to slide "uphill" on sliding board). She acknowledged that she could not yet do car transfers without assist and likely needs to go to SNF until she improves to the point that she can.    Arby Barrette, PT Acute Rehabilitation Services  Office [phone]   [name] 10/31/2021, 3:07 PM

## 2021-11-01 DIAGNOSIS — N186 End stage renal disease: Secondary | ICD-10-CM | POA: Diagnosis not present

## 2021-11-01 DIAGNOSIS — F419 Anxiety disorder, unspecified: Secondary | ICD-10-CM | POA: Diagnosis not present

## 2021-11-01 DIAGNOSIS — I10 Essential (primary) hypertension: Secondary | ICD-10-CM | POA: Diagnosis not present

## 2021-11-01 DIAGNOSIS — M86252 Subacute osteomyelitis, left femur: Secondary | ICD-10-CM | POA: Diagnosis not present

## 2021-11-01 LAB — RENAL FUNCTION PANEL
Albumin: 2.8 g/dL — ABNORMAL LOW (ref 3.5–5.0)
Anion gap: 15 (ref 5–15)
BUN: 69 mg/dL — ABNORMAL HIGH (ref 6–20)
CO2: 25 mmol/L (ref 22–32)
Calcium: 9.4 mg/dL (ref 8.9–10.3)
Chloride: 91 mmol/L — ABNORMAL LOW (ref 98–111)
Creatinine, Ser: 6.5 mg/dL — ABNORMAL HIGH (ref 0.44–1.00)
GFR, Estimated: 7 mL/min — ABNORMAL LOW (ref >60)
Glucose, Bld: 101 mg/dL — ABNORMAL HIGH (ref 70–99)
Phosphorus: 6.5 mg/dL — ABNORMAL HIGH (ref 2.5–4.6)
Potassium: 5.7 mmol/L — ABNORMAL HIGH (ref 3.5–5.1)
Sodium: 131 mmol/L — ABNORMAL LOW (ref 135–145)

## 2021-11-01 LAB — CBC
HCT: 36.1 % (ref 36.0–46.0)
Hemoglobin: 11.4 g/dL — ABNORMAL LOW (ref 12.0–15.0)
MCH: 28 pg (ref 26.0–34.0)
MCHC: 31.6 g/dL (ref 30.0–36.0)
MCV: 88.7 fL (ref 80.0–100.0)
Platelets: 206 10*3/uL (ref 150–400)
RBC: 4.07 MIL/uL (ref 3.87–5.11)
RDW: 15.1 % (ref 11.5–15.5)
WBC: 5.5 10*3/uL (ref 4.0–10.5)
nRBC: 0 % (ref 0.0–0.2)

## 2021-11-01 LAB — GLUCOSE, CAPILLARY
Glucose-Capillary: 114 mg/dL — ABNORMAL HIGH (ref 70–99)
Glucose-Capillary: 188 mg/dL — ABNORMAL HIGH (ref 70–99)
Glucose-Capillary: 203 mg/dL — ABNORMAL HIGH (ref 70–99)

## 2021-11-01 MED ORDER — HEPARIN SODIUM (PORCINE) 1000 UNIT/ML IJ SOLN
INTRAMUSCULAR | Status: AC
  Administered 2021-11-01: 4000 [IU]
  Filled 2021-11-01: qty 4

## 2021-11-01 MED ORDER — HEPARIN SODIUM (PORCINE) 1000 UNIT/ML DIALYSIS: 4000.0000 [IU] | Freq: Once | INTRAMUSCULAR | Status: DC

## 2021-11-01 MED ORDER — PENTAFLUOROPROP-TETRAFLUOROETH EX AERO
INHALATION_SPRAY | CUTANEOUS | Status: AC
  Filled 2021-11-01: qty 30

## 2021-11-01 NOTE — Progress Notes (Signed)
PROGRESS NOTE    [name]  MHD:[id number] DOB: 02/01/1966 DOA: 08/26/2021 PCP: [name], MD   Brief Narrative: [name] is a 56 y.o. female with a history of ESRD, anemia of CKD, diabetes mellitus type 2, hyperlipidemia, CVA, anxiety, depression, GERD, tobacco, left hip fracture s/p IM nail placement and recent hardware removal. Patient presented secondary to left hip bleeding and found to have evidence of subacute osteomyelitis and abscess of left femur. Orthopedic surgery/infectious disease consulted. Patient was started on empiric antibiotics and underwent multiple I&Ds. Patient completed a 6 week course of daptomycin. Discharge is pending ability for patient to tolerate HD in a chair which is complicated by patient's refusal/inability to sit in a chair secondary to pain.   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 Orthopedic surgery consulted. ID consulted. Patient underwent hardware removal on 6/10 (prior to admission) with I&D on 6/28 and 7/1. Infectious disease recommendations for 6 weeks of Daptomycin; patient has completed course. Patient still with pain. Afebrile. No leukocytosis. -Check CT left hip to rule out possible infection/abscess for etiology of continued pain  History of left intertrochanteric fracture This was previously managed with IM nail placement in 2021. Hardware removed secondary to above.  ESRD on HD Nephrology on board. -HD per nephrology  Anasarca Improved with hemodialysis.  Hyponatremia Management with hemodialysis.  Hypokalemia Hyperkalemia Management with hemodialysis  Anemia of chronic disease Acute blood loss anemia In setting of CKD in addition to blood loss from wound. Patient has received a total of 2 units of PRBC via transfusion. Hemoglobin stable.  Diabetes mellitus type 2 Diabetes is uncontrolled with hyperglycemia.  Most recent hemoglobin A1C of 7.1%. Patient is on insulin glargine and insulin aspart as an outpatient. -Continue Semglee 5 units qHS, Novolog 3 units TID with meals and SSI  Primary hypertension -Continue amlodipine, hydralazine and Coreg  Hyperlipidemia History of CVA -Continue Lipitor  GERD -Continue Pepcid and Protonix  Tobacco use Tobacco cessation discussed during this admission.  DVT prophylaxis: Heparin subq Code Status:   Code Status: Full Code Family Communication: None at bedside Disposition Plan: Discharge to SNF pending ability to tolerate HD sitting up   Consultants:  Orthopedic surgery Infectious disease Nephrology Vascular surgery  Procedures:  Vascular ultrasound duplex dialysis access 09/07/2021 Fistula revision branch ligation per Dr. Unk Lightning 09/19/2021- Left hip debridement, application of wound VAC by Dr. [name] 08/28/2021  Antimicrobials: Daptomycin Cefazolin    Subjective: Patient seen while receiving HD. Patient states she does not know why she is not in a chair for HD today. Staff confirms patient declined chair for HD.  Objective: BP 136/61   Pulse 67   Temp 98.4 F (36.9 C) (Oral)   Resp 12   Ht 5\' 10"  (1.778 m)   Wt 93.7 kg   SpO2 96%   BMI 29.64 kg/m   Examination:  General exam: Appears calm and comfortable Respiratory system: Clear to auscultation. Respiratory effort normal. Cardiovascular system: S1 & S2 heard, RRR. No murmurs. Gastrointestinal system: Abdomen is nondistended, soft and nontender. Normal bowel sounds heard. Central nervous system: Alert and oriented. No focal neurological deficits. Musculoskeletal: Left BKA. No calf tenderness Skin: No cyanosis. No rashes Psychiatry: Judgement and insight appear normal. Mood & affect appropriate.    Data Reviewed: I have personally reviewed following labs and imaging studies  CBC Lab Results  Component Value Date   WBC 5.5 11/01/2021  RBC 4.07 11/01/2021   HGB 11.4 (L)  11/01/2021   HCT 36.1 11/01/2021   MCV 88.7 11/01/2021   MCH 28.0 11/01/2021   PLT 206 11/01/2021   MCHC 31.6 11/01/2021   RDW 15.1 11/01/2021   LYMPHSABS 2.0 10/23/2021   MONOABS 1.1 (H) 10/23/2021   EOSABS 0.3 10/23/2021   BASOSABS 0.1 31/51/7616     Last metabolic panel Lab Results  Component Value Date   NA 131 (L) 11/01/2021   K 5.7 (H) 11/01/2021   CL 91 (L) 11/01/2021   CO2 25 11/01/2021   BUN 69 (H) 11/01/2021   CREATININE 6.50 (H) 11/01/2021   GLUCOSE 101 (H) 11/01/2021   GFRNONAA 7 (L) 11/01/2021   GFRAA 13 (L) 12/17/2018   CALCIUM 9.4 11/01/2021   PHOS 6.5 (H) 11/01/2021   PROT 6.6 10/02/2021   ALBUMIN 2.8 (L) 11/01/2021   LABGLOB 3.1 10/29/2018   AGRATIO 1.4 10/29/2018   BILITOT 0.5 10/02/2021   ALKPHOS 112 10/02/2021   AST 20 10/02/2021   ALT 14 10/02/2021   ANIONGAP 15 11/01/2021    GFR: Estimated Creatinine Clearance: 12.3 mL/min (A) (by C-G formula based on SCr of 6.5 mg/dL (H)).  No results found for this or any previous visit (from the past 240 hour(s)).    Radiology Studies: No results found.    LOS: 86 days    Cordelia Poche, MD [facility] 11/01/2021, 10:07 AM   If 7PM-7AM, please contact night-coverage [email or web address]

## 2021-11-01 NOTE — Progress Notes (Signed)
[facility] Progress Note   Subjective: HD in bed again. Says "they didn't put me in recliner". High UFG. Hyperkalemia noted.   Objective Vitals:   11/01/21 0707 11/01/21 0730 11/01/21 0800 11/01/21 0830  BP: (!) 141/77 (!) 139/56 (!) 148/59 (!) 133/56  Pulse: (!) 58  64 66  Resp: 11  11 10   Temp: 98.4 F (36.9 C)     TempSrc: Oral     SpO2: 98%  98% 96%  Weight: 93.7 kg     Height:       Physical Exam General: Obese female in NAD Heart:S1,S2 No M/R/G. RRR. SR on monitor.  Lungs: CTAB anteriorly Abdomen:NT, ND NABS Extremities: L BKA No stump edema, edema L hip. No RLE edema Dialysis Access: L AVF cannulated   Additional Objective Labs: Basic Metabolic Panel: Recent Labs  Lab 10/30/21 1347 11/01/21 0700  NA 127* 131*  K 5.8* 5.7*  CL 92* 91*  CO2 24 25  GLUCOSE 127* 101*  BUN 72* 69*  CREATININE 6.70* 6.50*  CALCIUM 9.0 9.4  PHOS 5.8* 6.5*   Liver Function Tests: Recent Labs  Lab 10/30/21 1347 11/01/21 0700  ALBUMIN 2.9* 2.8*   No results for input(s): "LIPASE", "AMYLASE" in the last 168 hours. CBC: Recent Labs  Lab 10/30/21 1347 11/01/21 0700  WBC 5.9 5.5  HGB 11.5* 11.4*  HCT 35.5* 36.1  MCV 87.7 88.7  PLT 198 206   Blood Culture    Component Value Date/Time   SDES TISSUE 08/28/2021 0917   SPECREQUEST LEFT THIGH TISSUE 08/28/2021 0917   CULT  08/28/2021 0917    MODERATE ENTEROCOCCUS FAECIUM VANCOMYCIN RESISTANT ENTEROCOCCUS ISOLATED NO ANAEROBES ISOLATED Sent to [facility] for further susceptibility testing. Performed at [facility], [place] [address], [place], [place] [address]    REPTSTATUS 09/02/2021 FINAL 08/28/2021 5284    Cardiac Enzymes: No results for input(s): "CKTOTAL", "CKMB", "CKMBINDEX", "TROPONINI" in the last 168 hours. CBG: Recent Labs  Lab 10/30/21 2101 10/31/21 0734 10/31/21 1136 10/31/21 1706 10/31/21 2101  GLUCAP 222* 119* 160* 159* 149*   Iron Studies: No results for input(s): "IRON",  "TIBC", "TRANSFERRIN", "FERRITIN" in the last 72 hours. @lablastinr3 @ Studies/Results: No results found. Medications:  sodium chloride     sodium chloride 10 mL/hr at 09/19/21 1310   methocarbamol (ROBAXIN) IV      alosetron  1 mg Oral BID   amitriptyline  25 mg Oral QHS   amLODipine  10 mg Oral Q24H   atorvastatin  40 mg Oral q1800   carvedilol  6.25 mg Oral BID   cyclobenzaprine  10 mg Oral TID   docusate sodium  100 mg Oral BID   DULoxetine  60 mg Oral Daily   famotidine  20 mg Oral Daily   furosemide  80 mg Oral Daily   [START ON 11/02/2021] heparin  4,000 Units Dialysis Once in dialysis   heparin  5,000 Units Subcutaneous Q8H   hydrALAZINE  25 mg Oral TID   insulin aspart  0-6 Units Subcutaneous TID WC   insulin aspart  3 Units Subcutaneous TID WC   insulin glargine-yfgn  5 Units Subcutaneous QHS   multivitamin  1 tablet Oral QHS   pantoprazole  80 mg Oral Daily   sevelamer carbonate  2,400 mg Oral TID WC   sodium chloride flush  3 mL Intravenous Q12H     Outpatient Dialysis Orders: MWF at [facility], 400/500, EDW 90kg, 2K/2Ca, LUE AVF,  -heparin 4000 unit IV  bolus   Assessment/Plan: 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 Back to OR 08/28/21, 08/31/21 for L hip debrid. and wound vac placement (now removed). Finished 6 week course of IV Daptomycin on 10/12/21. Management per primary team 2. ESRD: MWF schedule.  Next HD 11/04/2021.  3.  K+ 5.7 No hemolysis. Using 2.0 K bath, on renal diet. May need lokelma. Follow labs.  4. AVF Access: S/p branch ligation in OR 09/19/2021 per Dr. Virl Cagey. Was having intermittent cannulation issues but AVF now working well. 5. HTN/ volume: BP controlled. On amlodipine/hydralazine/Coreg. Still has edema but improved. Max UF as tolerated and has been reminded of importance of following fluid and sodium restrictions.   6. Anemia of ESRD: Hgb at goal, not on ESA.  7. Secondary HPTH: Cca controlled. Phos improving on  increased renvela dose. No VDRA for now. Ordered labs with HD today.  8. DM2: Insulin per primary 9. Dispo: SNF recommended but patient now wants to go home. Refused to sit in a recliner here but thinks she could tolerate it outpatient, I agree that there are no major physical barriers to her sitting in a chair and she agrees to try it tomorrow. Appreciate SW assistance, please see renal navigator notes.     Rosie Golson H. Sadie Pickar NP-C 11/01/2021, 8:39 AM  [employer] [phone]

## 2021-11-01 NOTE — Plan of Care (Signed)
  Problem: Pain Managment: Goal: General experience of comfort will improve Outcome: Progressing   

## 2021-11-01 NOTE — Progress Notes (Signed)
Patient refused to dialyze in a chair today.

## 2021-11-01 NOTE — Progress Notes (Signed)
Received patient in bed to unit. Alert and oriented. Informed consent signed and in chart.   Treatment initiated: 0700 Treatment completed: 1125   Patient tolerated well. Patient is alert and oriented, without acute distress. Report given to patient's floor nurse.  Transported back to the room.   Access used:  LUA Fistula  Access issues: none   Total UF removed: 4L Medication(s) given: Heparin  Post HD VS: 135/64 72 98% 11 97.8  Post HD weight: 89.5kg    [facility]

## 2021-11-02 ENCOUNTER — Inpatient Hospital Stay (HOSPITAL_COMMUNITY): Payer: Medicare Other

## 2021-11-02 DIAGNOSIS — M86252 Subacute osteomyelitis, left femur: Secondary | ICD-10-CM | POA: Diagnosis not present

## 2021-11-02 DIAGNOSIS — N186 End stage renal disease: Secondary | ICD-10-CM | POA: Diagnosis not present

## 2021-11-02 DIAGNOSIS — F419 Anxiety disorder, unspecified: Secondary | ICD-10-CM | POA: Diagnosis not present

## 2021-11-02 DIAGNOSIS — I10 Essential (primary) hypertension: Secondary | ICD-10-CM | POA: Diagnosis not present

## 2021-11-02 LAB — GLUCOSE, CAPILLARY
Glucose-Capillary: 143 mg/dL — ABNORMAL HIGH (ref 70–99)
Glucose-Capillary: 177 mg/dL — ABNORMAL HIGH (ref 70–99)
Glucose-Capillary: 137 mg/dL — ABNORMAL HIGH (ref 70–99)
Glucose-Capillary: 189 mg/dL — ABNORMAL HIGH (ref 70–99)

## 2021-11-02 LAB — SEDIMENTATION RATE: Sed Rate: 36 mm/hr — ABNORMAL HIGH (ref 0–22)

## 2021-11-02 LAB — C-REACTIVE PROTEIN: CRP: 0.6 mg/dL (ref <1.0)

## 2021-11-02 NOTE — Progress Notes (Signed)
[name] Progress Note   Subjective: Seen in room. No C/Os. No concrete DC plan yet.   Objective Vitals:   11/01/21 1625 11/01/21 2146 11/02/21 0533 11/02/21 1000  BP: 127/60 (!) 134/53 (!) 136/58 (!) 143/56  Pulse: 65 65 61 60  Resp: 16 16 17 16   Temp: 98.7 F (37.1 C) 98.8 F (37.1 C) 98 F (36.7 C) 98 F (36.7 C)  TempSrc:    Oral  SpO2: 99% 97% 99% 99%  Weight:  90.3 kg    Height:       Physical Exam General: Obese female in NAD Heart:S1,S2 No M/R/G. RRR. SR on monitor.  Lungs: CTAB anteriorly Abdomen:NT, ND NABS Extremities: L BKA No stump edema, edema L hip. No RLE edema Dialysis Access: L AVF + T/B   Additional Objective Labs: Basic Metabolic Panel: Recent Labs  Lab 10/30/21 1347 11/01/21 0700  NA 127* 131*  K 5.8* 5.7*  CL 92* 91*  CO2 24 25  GLUCOSE 127* 101*  BUN 72* 69*  CREATININE 6.70* 6.50*  CALCIUM 9.0 9.4  PHOS 5.8* 6.5*   Liver Function Tests: Recent Labs  Lab 10/30/21 1347 11/01/21 0700  ALBUMIN 2.9* 2.8*   No results for input(s): "LIPASE", "AMYLASE" in the last 168 hours. CBC: Recent Labs  Lab 10/30/21 1347 11/01/21 0700  WBC 5.9 5.5  HGB 11.5* 11.4*  HCT 35.5* 36.1  MCV 87.7 88.7  PLT 198 206   Blood Culture    Component Value Date/Time   SDES TISSUE 08/28/2021 0917   SPECREQUEST LEFT THIGH TISSUE 08/28/2021 0917   CULT  08/28/2021 0917    MODERATE ENTEROCOCCUS FAECIUM VANCOMYCIN RESISTANT ENTEROCOCCUS ISOLATED NO ANAEROBES ISOLATED Sent to [place] for further susceptibility testing. Performed at [facility], [place] [address], [place], [place] [address]    REPTSTATUS 09/02/2021 FINAL 08/28/2021 4917    Cardiac Enzymes: No results for input(s): "CKTOTAL", "CKMB", "CKMBINDEX", "TROPONINI" in the last 168 hours. CBG: Recent Labs  Lab 10/31/21 2101 11/01/21 1221 11/01/21 1624 11/01/21 2144 11/02/21 0717  GLUCAP 149* 114* 188* 203* 143*   Iron Studies: No results for input(s): "IRON",  "TIBC", "TRANSFERRIN", "FERRITIN" in the last 72 hours. @lablastinr3 @ Studies/Results: No results found. Medications:  sodium chloride     sodium chloride 10 mL/hr at 09/19/21 1310   methocarbamol (ROBAXIN) IV      alosetron  1 mg Oral BID   amitriptyline  25 mg Oral QHS   amLODipine  10 mg Oral Q24H   atorvastatin  40 mg Oral q1800   carvedilol  6.25 mg Oral BID   cyclobenzaprine  10 mg Oral TID   docusate sodium  100 mg Oral BID   DULoxetine  60 mg Oral Daily   famotidine  20 mg Oral Daily   furosemide  80 mg Oral Daily   heparin  5,000 Units Subcutaneous Q8H   hydrALAZINE  25 mg Oral TID   insulin aspart  0-6 Units Subcutaneous TID WC   insulin aspart  3 Units Subcutaneous TID WC   insulin glargine-yfgn  5 Units Subcutaneous QHS   multivitamin  1 tablet Oral QHS   pantoprazole  80 mg Oral Daily   sevelamer carbonate  2,400 mg Oral TID WC   sodium chloride flush  3 mL Intravenous Q12H     Outpatient Dialysis Orders: MWF at [facility], 400/500, EDW 90kg, 2K/2Ca, LUE AVF,  -heparin 4000 unit IV bolus   Assessment/Plan: L hip infection :S/p 08/10/21 surg. ->  persistent VRE infection. Back to OR 08/28/21, 08/31/21 for L hip debrid. and wound vac placement (now removed). Finished 6 week course of IV Daptomycin on 10/12/21. Management per primary team 2. ESRD: MWF schedule.  Next HD 11/04/2021.  3.  K+ 5.7 No hemolysis. Using 2.0 K bath, on renal diet. May need lokelma. Follow labs.  4. AVF Access: S/p branch ligation in OR 09/19/2021 per Dr. Virl Cagey. Was having intermittent cannulation issues but AVF now working well. 5. HTN/ volume: BP controlled. On amlodipine/hydralazine/Coreg. Still has edema but improved. Max UF as tolerated and has been reminded of importance of following fluid and sodium restrictions.   6. Anemia of ESRD: Hgb at goal, not on ESA.  7. Secondary HPTH: Cca controlled. Phos improving on increased renvela dose. No VDRA for now. Ordered labs with HD today.   8. DM2: Insulin per primary 9. Dispo: SNF recommended but patient now wants to go home. Refused to sit in a recliner here but thinks she could tolerate it outpatient, I agree that there are no major physical barriers to her sitting in a chair and she agrees to try it tomorrow. Appreciate SW assistance, please see renal navigator notes.   Donna Salomone H. Leopoldo Mazzie NP-C 11/02/2021, 10:40 AM  [employer] [phone]

## 2021-11-02 NOTE — Progress Notes (Signed)
PROGRESS NOTE    [name]  HWY:[id number] DOB: 1965-08-04 DOA: 08/26/2021 PCP: [name], MD   Brief Narrative: [name] is a 56 y.o. female with a history of ESRD, anemia of CKD, diabetes mellitus type 2, hyperlipidemia, CVA, anxiety, depression, GERD, tobacco, left hip fracture s/p IM nail placement and recent hardware removal. Patient presented secondary to left hip bleeding and found to have evidence of subacute osteomyelitis and abscess of left femur. Orthopedic surgery/infectious disease consulted. Patient was started on empiric antibiotics and underwent multiple I&Ds. Patient completed a 6 week course of daptomycin. Discharge is pending ability for patient to tolerate HD in a chair which is complicated by patient's refusal/inability to sit in a chair secondary to pain.   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 Orthopedic surgery consulted. ID consulted. Patient underwent hardware removal on 6/10 (prior to admission) with I&D on 6/28 and 7/1. Infectious disease recommendations for 6 weeks of Daptomycin; patient has completed course. Patient still with pain. Afebrile. No leukocytosis. CT left hip is pending.  History of left intertrochanteric fracture This was previously managed with IM nail placement in 2021. Hardware removed secondary to above.  ESRD on HD Nephrology on board. -HD per nephrology  Anasarca Improved with hemodialysis.  Hyponatremia Management with hemodialysis.  Hypokalemia Hyperkalemia Management with hemodialysis  Anemia of chronic disease Acute blood loss anemia In setting of CKD in addition to blood loss from wound. Patient has received a total of 2 units of PRBC via transfusion. Hemoglobin stable.  Diabetes mellitus type 2 Diabetes is uncontrolled with hyperglycemia. Most recent hemoglobin A1C of 7.1%. Patient is on insulin glargine  and insulin aspart as an outpatient. -Continue Semglee 5 units qHS, Novolog 3 units TID with meals and SSI  Primary hypertension -Continue amlodipine, hydralazine and Coreg  Hyperlipidemia History of CVA -Continue Lipitor  GERD -Continue Pepcid and Protonix  Tobacco use Tobacco cessation discussed during this admission.  DVT prophylaxis: Heparin subq Code Status:   Code Status: Full Code Family Communication: None at bedside Disposition Plan: Discharge to SNF pending ability to tolerate HD sitting up   Consultants:  Orthopedic surgery Infectious disease Nephrology Vascular surgery  Procedures:  Vascular ultrasound duplex dialysis access 09/07/2021 Fistula revision branch ligation per Dr. Unk Lightning 09/19/2021- Left hip debridement, application of wound VAC by Dr. [name] 08/28/2021  Antimicrobials: Daptomycin Cefazolin    Subjective: Left hip pain. Otherwise no concerns.  Objective: BP (!) 136/58 (BP Location: Right Arm)   Pulse 61   Temp 98 F (36.7 C)   Resp 17   Ht 5\' 10"  (1.778 m)   Wt 90.3 kg   SpO2 99%   BMI 28.56 kg/m   Examination:  General exam: Appears calm and comfortable Respiratory system: Clear to auscultation. Respiratory effort normal. Cardiovascular system: S1 & S2 heard, RRR. No murmurs, rubs, gallops or clicks. Gastrointestinal system: Abdomen is nondistended, soft and nontender. Normal bowel sounds heard. Central nervous system: Alert and oriented. No focal neurological deficits. Musculoskeletal: Left BKA. Skin: No cyanosis. No rashes  Data Reviewed: I have personally reviewed following labs and imaging studies  CBC Lab Results  Component Value Date   WBC 5.5 11/01/2021   RBC 4.07 11/01/2021   HGB 11.4 (L) 11/01/2021   HCT 36.1 11/01/2021   MCV 88.7 11/01/2021   MCH 28.0 11/01/2021   PLT 206 11/01/2021   MCHC 31.6 11/01/2021   RDW  15.1 11/01/2021   LYMPHSABS 2.0 10/23/2021   MONOABS 1.1 (H) 10/23/2021   EOSABS 0.3 10/23/2021    BASOSABS 0.1 25/85/2778     Last metabolic panel Lab Results  Component Value Date   NA 131 (L) 11/01/2021   K 5.7 (H) 11/01/2021   CL 91 (L) 11/01/2021   CO2 25 11/01/2021   BUN 69 (H) 11/01/2021   CREATININE 6.50 (H) 11/01/2021   GLUCOSE 101 (H) 11/01/2021   GFRNONAA 7 (L) 11/01/2021   GFRAA 13 (L) 12/17/2018   CALCIUM 9.4 11/01/2021   PHOS 6.5 (H) 11/01/2021   PROT 6.6 10/02/2021   ALBUMIN 2.8 (L) 11/01/2021   LABGLOB 3.1 10/29/2018   AGRATIO 1.4 10/29/2018   BILITOT 0.5 10/02/2021   ALKPHOS 112 10/02/2021   AST 20 10/02/2021   ALT 14 10/02/2021   ANIONGAP 15 11/01/2021    GFR: Estimated Creatinine Clearance: 12.1 mL/min (A) (by C-G formula based on SCr of 6.5 mg/dL (H)).  No results found for this or any previous visit (from the past 240 hour(s)).    Radiology Studies: No results found.    LOS: 68 days    Cordelia Poche, MD [facility] 11/02/2021, 10:07 AM   If 7PM-7AM, please contact night-coverage [email or web address]

## 2021-11-02 NOTE — Consult Note (Signed)
Reason for Consult: left hip pain Referring Physician: Tanyah, Debruyne is an 56 y.o. female.  HPI:  56 year old patient  of Dr. [name]. Dr. Lonny Prude consulted orthopedics today due to patient's increased left hip pain and CT showing fluid collection left hip.  Status post  removal of trochanteric nail left hip 08/10/21 with subsequent I&D x 2 left hip infection. States her hip pain has not improved with time. She feels the hip pain may have even became worse since finishing daptomycin 2 weeks ago.   Past Medical History:  Diagnosis Date   Anxiety 2002   Chest tightness    Chronic kidney disease    Depression 2001   Diabetes type 1, uncontrolled    at age 26   Diabetic neuropathy ([place])    Essential hypertension 2015   GERD (gastroesophageal reflux disease)    about age of 50   Headache    Nausea and vomiting in adult    Stroke [facility])    Urinary frequency    Yeast vaginitis     Past Surgical History:  Procedure Laterality Date   A/V FISTULAGRAM Left 08/22/2021   Procedure: A/V Fistulagram;  Surgeon: Cherre Robins, MD;  Location: [place] [facility];  Service: Cardiovascular;  Laterality: Left;   ACHILLES TENDON SURGERY Left 03/10/2013   Procedure: LEFT CHOPART AMPUTATION/ LEFT TENDON ACHILLES [place];  Surgeon: Wylene Simmer, MD;  Location: [place];  Service: Orthopedics;  Laterality: Left;   AMPUTATION Left 06/15/2012   Procedure: AMPUTATION DIGIT;  Surgeon: Meredith Pel, MD;  Location: [place];  Service: Orthopedics;  Laterality: Left;  Left great toe revision amputation   AMPUTATION Left 01/12/2020   Procedure: AMPUTATION BELOW KNEE;  Surgeon: Wylene Simmer, MD;  Location: [facility];  Service: Orthopedics;  Laterality: Left;   APPLICATION OF WOUND VAC  08/28/2021   Procedure: APPLICATION OF WOUND VAC;  Surgeon: Newt Minion, MD;  Location: [place];  Service: Orthopedics;;   AV FISTULA PLACEMENT Left 01/09/2020   Procedure: LEFT UPPER ARM ARTERIOVENOUS (AV) FISTULA CREATION;   Surgeon: Cherre Robins, MD;  Location: [place];  Service: Vascular;  Laterality: Left;   ENDOMETRIAL ABLATION     HARDWARE REMOVAL Left 08/10/2021   Procedure: LEFT HIP REMOVAL NAIL;  Surgeon: Newt Minion, MD;  Location: [place];  Service: Orthopedics;  Laterality: Left;   I & D EXTREMITY Left 01/19/2020   Procedure: IRRIGATION AND DEBRIDEMENT Left Hip;  Surgeon: Rod Can, MD;  Location: [place];  Service: Orthopedics;  Laterality: Left;   I & D EXTREMITY Left 08/28/2021   Procedure: LEFT HIP DEBRIDEMENT;  Surgeon: Newt Minion, MD;  Location: [place];  Service: Orthopedics;  Laterality: Left;   I & D EXTREMITY Left 08/31/2021   Procedure: REPEAT DEBRIDEMENT LEFT HIP;  Surgeon: Newt Minion, MD;  Location: [place];  Service: Orthopedics;  Laterality: Left;   INSERTION OF DIALYSIS CATHETER Right 01/09/2020   Procedure: INSERTION OF DIALYSIS CATHETER;  Surgeon: Cherre Robins, MD;  Location: [place];  Service: Vascular;  Laterality: Right;   INTRAMEDULLARY (IM) NAIL INTERTROCHANTERIC Left 12/29/2019   Procedure: INTRAMEDULLARY (IM) NAIL INTERTROCHANTRIC;  Surgeon: Rod Can, MD;  Location: [facility];  Service: Orthopedics;  Laterality: Left;   IR ANGIOGRAM EXTREMITY LEFT  12/17/2018   IR FEM POP ART ATHERECT INC PTA MOD SED  12/17/2018   IR RADIOLOGIST EVAL & MGMT  11/30/2018   IR RADIOLOGIST EVAL & MGMT  12/09/2018   IR RADIOLOGIST EVAL & MGMT  02/08/2019   IR US GUIDE VASC ACCESS RIGHT  12/17/2018   LAPAROSCOPIC CHOLECYSTECTOMY  01/30/2015   [facility]    REVISON OF ARTERIOVENOUS FISTULA Left 09/19/2021   Procedure: LIGATION OF LEFT ARM ARTERIOVENOUS FISTULA;  Surgeon: Broadus John, MD;  [facility];  Service: Vascular;  Laterality: Left;    Family History  Problem Relation Age of Onset   Diabetes Mother    Heart attack Mother    Heart disease Mother        before age 58   Hypertension Mother    Hyperlipidemia Mother    Diabetes Father    Heart attack Father     Heart disease Father        before age 108   Diabetes Sister    Hypertension Sister     Social History:  reports that she has been smoking cigarettes. She has a 11.00 pack-year smoking history. She has never used smokeless tobacco. She reports current alcohol use. She reports that she does not use drugs.  Allergies:  Allergies  Allergen Reactions   Trazodone Swelling   Latex Rash   Lidocaine Itching    Medications: I have reviewed the patient's current medications.  Results for orders placed or performed during the hospital encounter of 08/26/21 (from the past 48 hour(s))  Glucose, capillary     Status: Abnormal   Collection Time: 10/31/21  5:06 PM  Result Value Ref Range   Glucose-Capillary 159 (H) 70 - 99 mg/dL    Comment: Glucose reference range applies only to samples taken after fasting for at least 8 hours.  Glucose, capillary     Status: Abnormal   Collection Time: 10/31/21  9:01 PM  Result Value Ref Range   Glucose-Capillary 149 (H) 70 - 99 mg/dL    Comment: Glucose reference range applies only to samples taken after fasting for at least 8 hours.  Renal function panel     Status: Abnormal   Collection Time: 11/01/21  7:00 AM  Result Value Ref Range   Sodium 131 (L) 135 - 145 mmol/L   Potassium 5.7 (H) 3.5 - 5.1 mmol/L   Chloride 91 (L) 98 - 111 mmol/L   CO2 25 22 - 32 mmol/L   Glucose, Bld 101 (H) 70 - 99 mg/dL    Comment: Glucose reference range applies only to samples taken after fasting for at least 8 hours.   BUN 69 (H) 6 - 20 mg/dL   Creatinine, Ser 6.50 (H) 0.44 - 1.00 mg/dL   Calcium 9.4 8.9 - 10.3 mg/dL   Phosphorus 6.5 (H) 2.5 - 4.6 mg/dL   Albumin 2.8 (L) 3.5 - 5.0 g/dL   GFR, Estimated 7 (L) >60 mL/min    Comment: (NOTE) Calculated using the CKD-EPI Creatinine Equation (2021)    Anion gap 15 5 - 15    Comment: Performed at [facility] [address] [place], [place] [address]  CBC     Status: Abnormal   Collection Time: 11/01/21  7:00 AM   Result Value Ref Range   WBC 5.5 4.0 - 10.5 K/uL   RBC 4.07 3.87 - 5.11 MIL/uL   Hemoglobin 11.4 (L) 12.0 - 15.0 g/dL   HCT 36.1 36.0 - 46.0 %   MCV 88.7 80.0 - 100.0 fL   MCH 28.0 26.0 - 34.0 pg   MCHC 31.6 30.0 - 36.0 g/dL   RDW 15.1 11.5 - 15.5 %  Platelets 206 150 - 400 K/uL   nRBC 0.0 0.0 - 0.2 %    Comment: Performed at [facility], [place] [address], [place], [place] [address]  Glucose, capillary     Status: Abnormal   Collection Time: 11/01/21 12:21 PM  Result Value Ref Range   Glucose-Capillary 114 (H) 70 - 99 mg/dL    Comment: Glucose reference range applies only to samples taken after fasting for at least 8 hours.  Glucose, capillary     Status: Abnormal   Collection Time: 11/01/21  4:24 PM  Result Value Ref Range   Glucose-Capillary 188 (H) 70 - 99 mg/dL    Comment: Glucose reference range applies only to samples taken after fasting for at least 8 hours.  Glucose, capillary     Status: Abnormal   Collection Time: 11/01/21  9:44 PM  Result Value Ref Range   Glucose-Capillary 203 (H) 70 - 99 mg/dL    Comment: Glucose reference range applies only to samples taken after fasting for at least 8 hours.   Comment 1 Notify RN    Comment 2 Document in Chart   Glucose, capillary     Status: Abnormal   Collection Time: 11/02/21  7:17 AM  Result Value Ref Range   Glucose-Capillary 143 (H) 70 - 99 mg/dL    Comment: Glucose reference range applies only to samples taken after fasting for at least 8 hours.  Glucose, capillary     Status: Abnormal   Collection Time: 11/02/21 11:26 AM  Result Value Ref Range   Glucose-Capillary 177 (H) 70 - 99 mg/dL    Comment: Glucose reference range applies only to samples taken after fasting for at least 8 hours.    CT HIP LEFT WO CONTRAST  Result Date: 11/02/2021 CLINICAL DATA:  Osteomyelitis, hip Hip pain. Chronic. History of osteomyelitis. EXAM: CT OF THE LEFT HIP WITHOUT CONTRAST TECHNIQUE: Multidetector CT imaging of the left hip was  performed according to the standard protocol. Multiplanar CT image reconstructions were also generated. RADIATION DOSE REDUCTION: This exam was performed according to the departmental dose-optimization program which includes automated exposure control, adjustment of the mA and/or kV according to patient size and/or use of iterative reconstruction technique. COMPARISON:  CT 08/26/2021 FINDINGS: Bones/Joint/Cartilage Prior hardware removal from the proximal femur with unchanged widened intramedullary canals and ununited left intertrochanteric fracture. Similar capsular thickening and small amount of joint fluid. There is increased periostitis and cortical irregularity along the greater trochanter. Unchanged sclerosis of the residual femoral neck and proximal femoral shaft. Ligaments Suboptimally assessed by CT. Muscles and Tendons No intramuscular collection. Soft tissues There is soft tissue thickening and fluid collection along the lateral aspect of the hip, measuring 3.6 cm short axis, extending from the skin surface to the posterior aspect of the hip joint, in the same location as previously seen collection which contained gas at that time. There is no residual soft tissue gas present. There is severe skin thickening and soft tissue swelling of the lateral hip, increased from the prior exam. There is reactive left inguinal lymphadenopathy. IMPRESSION: Soft tissue thickening with fluid collection along the lateral aspect of the left hip, measuring up to 3.6 cm short axis, extending from the skin to the hip joint. Increased adjacent skin thickening and subcutaneous soft tissue swelling. Increased periostitis and cortical irregularity along the greater trochanter concerning for osteomyelitis. Unchanged ununited intertrochanteric fracture after hardware removal. Electronically Signed   By: Maurine Simmering M.D.   On: 11/02/2021 10:41  Review of Systems Blood pressure (!) 143/56, pulse 60, temperature 98 F (36.7 C),  temperature source Oral, resp. rate 16, height 5\' 10"  (1.778 m), weight 90.3 kg, SpO2 99 %. Physical Exam Constitutional:      Appearance: She is not ill-appearing or diaphoretic.  Pulmonary:     Effort: Pulmonary effort is normal.  Neurological:     Mental Status: She is alert and oriented to person, place, and time.  Psychiatric:        Mood and Affect: Mood normal.   Left hip: Small opening with serous drainage proximal hip incision. Slight erythema globally about hip with induration. Posterior thigh and calf supple. BKA incision is benign.   Assessment/Plan: Pain : Currently on Oxycodone and robaxin .   Patient is currently afebrile and WBC 5.5 yesterday. No signs of sepsis,   Chronic left hip osteomyelitis : Reviewed CT scan with Dr. Erlinda Hong. Felt that fluid collection could be post surgical. Recommended checking CRP and SED rate.   Will follow up on labs when available and make further recommendations.   Mckenzey Parcell 11/02/2021, 3:58 PM

## 2021-11-03 DIAGNOSIS — M86252 Subacute osteomyelitis, left femur: Secondary | ICD-10-CM | POA: Diagnosis not present

## 2021-11-03 DIAGNOSIS — F419 Anxiety disorder, unspecified: Secondary | ICD-10-CM | POA: Diagnosis not present

## 2021-11-03 DIAGNOSIS — I10 Essential (primary) hypertension: Secondary | ICD-10-CM | POA: Diagnosis not present

## 2021-11-03 DIAGNOSIS — N186 End stage renal disease: Secondary | ICD-10-CM | POA: Diagnosis not present

## 2021-11-03 LAB — GLUCOSE, CAPILLARY
Glucose-Capillary: 151 mg/dL — ABNORMAL HIGH (ref 70–99)
Glucose-Capillary: 142 mg/dL — ABNORMAL HIGH (ref 70–99)
Glucose-Capillary: 142 mg/dL — ABNORMAL HIGH (ref 70–99)
Glucose-Capillary: 163 mg/dL — ABNORMAL HIGH (ref 70–99)

## 2021-11-03 NOTE — Progress Notes (Signed)
PROGRESS NOTE    [name]  XMI:[id number] DOB: 1965-05-17 DOA: 08/26/2021 PCP: [name], MD   Brief Narrative: [name] is a 56 y.o. female with a history of ESRD, anemia of CKD, diabetes mellitus type 2, hyperlipidemia, CVA, anxiety, depression, GERD, tobacco, left hip fracture s/p IM nail placement and recent hardware removal. Patient presented secondary to left hip bleeding and found to have evidence of subacute osteomyelitis and abscess of left femur. Orthopedic surgery/infectious disease consulted. Patient was started on empiric antibiotics and underwent multiple I&Ds. Patient completed a 6 week course of daptomycin. Discharge is pending ability for patient to tolerate HD in a chair which is complicated by patient's refusal/inability to sit in a chair secondary to pain.   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 Orthopedic surgery consulted. ID consulted. Patient underwent hardware removal on 6/10 (prior to admission) with I&D on 6/28 and 7/1. Infectious disease recommendations for 6 weeks of Daptomycin; patient has completed course. Patient still with pain. Afebrile. No leukocytosis. CT hip with fluid collection and soft tissue thickening. Orthopedic surgery consulted and evaluated. CRP/sed rate improved from prior at 0.6/36 respectively. Unlikely persistent infection. Recommendation for follow-up with Dr. [name] within 1 week of discharge.  History of left intertrochanteric fracture This was previously managed with IM nail placement in 2021. Hardware removed secondary to above.  ESRD on HD Nephrology on board. -HD per nephrology  Anasarca Improved with hemodialysis.  Hyponatremia Management with hemodialysis.  Hypokalemia Hyperkalemia Management with hemodialysis  Anemia of chronic disease Acute blood loss anemia In setting of CKD in addition to blood loss  from wound. Patient has received a total of 2 units of PRBC via transfusion. Hemoglobin stable.  Diabetes mellitus type 2 Diabetes is uncontrolled with hyperglycemia. Most recent hemoglobin A1C of 7.1%. Patient is on insulin glargine and insulin aspart as an outpatient. -Continue Semglee 5 units qHS, Novolog 3 units TID with meals and SSI  Primary hypertension -Continue amlodipine, hydralazine and Coreg  Hyperlipidemia History of CVA -Continue Lipitor  GERD -Continue Pepcid and Protonix  Tobacco use Tobacco cessation discussed during this admission.  DVT prophylaxis: Heparin subq Code Status:   Code Status: Full Code Family Communication: None at bedside Disposition Plan: Discharge to SNF pending ability to tolerate HD sitting up   Consultants:  Orthopedic surgery Infectious disease Nephrology Vascular surgery  Procedures:  Vascular ultrasound duplex dialysis access 09/07/2021 Fistula revision branch ligation per Dr. Unk Lightning 09/19/2021- Left hip debridement, application of wound VAC by Dr. [name] 08/28/2021  Antimicrobials: Daptomycin Cefazolin    Subjective: Patient without specific concerns for me today regarding medical care.  Objective: BP (!) 144/56 (BP Location: Right Arm)   Pulse 61   Temp 98.2 F (36.8 C)   Resp 18   Ht 5\' 10"  (1.778 m)   Wt 90.3 kg   SpO2 96%   BMI 28.56 kg/m   Examination:  General exam: Appears calm and comfortable Respiratory system: Clear to auscultation. Respiratory effort normal. Cardiovascular system: S1 & S2 heard, RRR. Gastrointestinal system: Abdomen is nondistended, soft and nontender. No organomegaly or masses felt. Normal bowel sounds heard. Central nervous system: Alert and oriented.  Data Reviewed: I have personally reviewed following labs and imaging studies  CBC Lab Results  Component Value Date   WBC 5.5 11/01/2021   RBC 4.07 11/01/2021   HGB 11.4 (L) 11/01/2021   HCT 36.1 11/01/2021  MCV 88.7 11/01/2021    MCH 28.0 11/01/2021   PLT 206 11/01/2021   MCHC 31.6 11/01/2021   RDW 15.1 11/01/2021   LYMPHSABS 2.0 10/23/2021   MONOABS 1.1 (H) 10/23/2021   EOSABS 0.3 10/23/2021   BASOSABS 0.1 83/72/9021     Last metabolic panel Lab Results  Component Value Date   NA 131 (L) 11/01/2021   K 5.7 (H) 11/01/2021   CL 91 (L) 11/01/2021   CO2 25 11/01/2021   BUN 69 (H) 11/01/2021   CREATININE 6.50 (H) 11/01/2021   GLUCOSE 101 (H) 11/01/2021   GFRNONAA 7 (L) 11/01/2021   GFRAA 13 (L) 12/17/2018   CALCIUM 9.4 11/01/2021   PHOS 6.5 (H) 11/01/2021   PROT 6.6 10/02/2021   ALBUMIN 2.8 (L) 11/01/2021   LABGLOB 3.1 10/29/2018   AGRATIO 1.4 10/29/2018   BILITOT 0.5 10/02/2021   ALKPHOS 112 10/02/2021   AST 20 10/02/2021   ALT 14 10/02/2021   ANIONGAP 15 11/01/2021    GFR: Estimated Creatinine Clearance: 12.1 mL/min (A) (by C-G formula based on SCr of 6.5 mg/dL (H)).  No results found for this or any previous visit (from the past 240 hour(s)).    Radiology Studies: CT HIP LEFT WO CONTRAST  Result Date: 11/02/2021 CLINICAL DATA:  Osteomyelitis, hip Hip pain. Chronic. History of osteomyelitis. EXAM: CT OF THE LEFT HIP WITHOUT CONTRAST TECHNIQUE: Multidetector CT imaging of the left hip was performed according to the standard protocol. Multiplanar CT image reconstructions were also generated. RADIATION DOSE REDUCTION: This exam was performed according to the departmental dose-optimization program which includes automated exposure control, adjustment of the mA and/or kV according to patient size and/or use of iterative reconstruction technique. COMPARISON:  CT 08/26/2021 FINDINGS: Bones/Joint/Cartilage Prior hardware removal from the proximal femur with unchanged widened intramedullary canals and ununited left intertrochanteric fracture. Similar capsular thickening and small amount of joint fluid. There is increased periostitis and cortical irregularity along the greater trochanter. Unchanged  sclerosis of the residual femoral neck and proximal femoral shaft. Ligaments Suboptimally assessed by CT. Muscles and Tendons No intramuscular collection. Soft tissues There is soft tissue thickening and fluid collection along the lateral aspect of the hip, measuring 3.6 cm short axis, extending from the skin surface to the posterior aspect of the hip joint, in the same location as previously seen collection which contained gas at that time. There is no residual soft tissue gas present. There is severe skin thickening and soft tissue swelling of the lateral hip, increased from the prior exam. There is reactive left inguinal lymphadenopathy. IMPRESSION: Soft tissue thickening with fluid collection along the lateral aspect of the left hip, measuring up to 3.6 cm short axis, extending from the skin to the hip joint. Increased adjacent skin thickening and subcutaneous soft tissue swelling. Increased periostitis and cortical irregularity along the greater trochanter concerning for osteomyelitis. Unchanged ununited intertrochanteric fracture after hardware removal. Electronically Signed   By: Maurine Simmering M.D.   On: 11/02/2021 10:41      LOS: 69 days    Cordelia Poche, MD [facility] 11/03/2021, 3:41 PM   If 7PM-7AM, please contact night-coverage [email or web address]

## 2021-11-03 NOTE — Progress Notes (Signed)
Patient ID: [name], female   DOB: 11-19-1965, 56 y.o.   MRN: [id number] Patient reports that her left hip pain remains basically unchanged.  She states that the pain is nothing that she cannot deal with and that the pain medicine greatly helps with her pain.  Labs: C-reactive protein 0.6 down from 4.9 mg/dL 3 months ago Sed rate 36 down from 86 mm/hr 3 months ago  Physical exam: General: Well-developed well-nourished female no acute distress.   Left hip: Decreased induration globally about the hip with some signs of wrinkling of the skin which is changed since yesterday.  Drainage still from the small opening and needs incision site proximally remains consistent with serous drainage.  No signs of gross infection.   Plan: Recommend no surgical intervention at this point in time would recommend follow-up with Dr. [name] within 1 week of discharge.  Continue dry dressing changes to the hip in the interim.

## 2021-11-03 NOTE — Progress Notes (Signed)
[facility] Progress Note   Subjective: No improvement in L hip pain. Orthopedic consulted again. HD tomorrow.   Objective Vitals:   11/02/21 1000 11/02/21 1644 11/02/21 2108 11/03/21 0527  BP: (!) 143/56 (!) 146/62 119/81 (!) 158/62  Pulse: 60 (!) 59 63 62  Resp: 16 18 17 17   Temp: 98 F (36.7 C) 98 F (36.7 C) 98.3 F (36.8 C) 97.8 F (36.6 C)  TempSrc: Oral     SpO2: 99% 98% 98% 99%  Weight:      Height:       Physical Exam General: Obese female in NAD Heart:S1,S2 No M/R/G. RRR. SR on monitor.  Lungs: CTAB anteriorly Abdomen:NT, ND NABS Extremities: L BKA No stump edema, edema L hip. No RLE edema Dialysis Access: L AVF + T/B   Additional Objective Labs: Basic Metabolic Panel: Recent Labs  Lab 10/30/21 1347 11/01/21 0700  NA 127* 131*  K 5.8* 5.7*  CL 92* 91*  CO2 24 25  GLUCOSE 127* 101*  BUN 72* 69*  CREATININE 6.70* 6.50*  CALCIUM 9.0 9.4  PHOS 5.8* 6.5*   Liver Function Tests: Recent Labs  Lab 10/30/21 1347 11/01/21 0700  ALBUMIN 2.9* 2.8*   No results for input(s): "LIPASE", "AMYLASE" in the last 168 hours. CBC: Recent Labs  Lab 10/30/21 1347 11/01/21 0700  WBC 5.9 5.5  HGB 11.5* 11.4*  HCT 35.5* 36.1  MCV 87.7 88.7  PLT 198 206   Blood Culture    Component Value Date/Time   SDES TISSUE 08/28/2021 0917   SPECREQUEST LEFT THIGH TISSUE 08/28/2021 0917   CULT  08/28/2021 0917    MODERATE ENTEROCOCCUS FAECIUM VANCOMYCIN RESISTANT ENTEROCOCCUS ISOLATED NO ANAEROBES ISOLATED Sent to [place] for further susceptibility testing. Performed at [facility], [place] [address], [place], [place] [address]    REPTSTATUS 09/02/2021 FINAL 08/28/2021 6333    Cardiac Enzymes: No results for input(s): "CKTOTAL", "CKMB", "CKMBINDEX", "TROPONINI" in the last 168 hours. CBG: Recent Labs  Lab 11/02/21 0717 11/02/21 1126 11/02/21 1642 11/02/21 2107 11/03/21 0720  GLUCAP 143* 177* 137* 189* 151*   Iron Studies: No results  for input(s): "IRON", "TIBC", "TRANSFERRIN", "FERRITIN" in the last 72 hours. @lablastinr3 @ Studies/Results: CT HIP LEFT WO CONTRAST  Result Date: 11/02/2021 CLINICAL DATA:  Osteomyelitis, hip Hip pain. Chronic. History of osteomyelitis. EXAM: CT OF THE LEFT HIP WITHOUT CONTRAST TECHNIQUE: Multidetector CT imaging of the left hip was performed according to the standard protocol. Multiplanar CT image reconstructions were also generated. RADIATION DOSE REDUCTION: This exam was performed according to the departmental dose-optimization program which includes automated exposure control, adjustment of the mA and/or kV according to patient size and/or use of iterative reconstruction technique. COMPARISON:  CT 08/26/2021 FINDINGS: Bones/Joint/Cartilage Prior hardware removal from the proximal femur with unchanged widened intramedullary canals and ununited left intertrochanteric fracture. Similar capsular thickening and small amount of joint fluid. There is increased periostitis and cortical irregularity along the greater trochanter. Unchanged sclerosis of the residual femoral neck and proximal femoral shaft. Ligaments Suboptimally assessed by CT. Muscles and Tendons No intramuscular collection. Soft tissues There is soft tissue thickening and fluid collection along the lateral aspect of the hip, measuring 3.6 cm short axis, extending from the skin surface to the posterior aspect of the hip joint, in the same location as previously seen collection which contained gas at that time. There is no residual soft tissue gas present. There is severe skin thickening and soft tissue swelling of the lateral hip, increased  from the prior exam. There is reactive left inguinal lymphadenopathy. IMPRESSION: Soft tissue thickening with fluid collection along the lateral aspect of the left hip, measuring up to 3.6 cm short axis, extending from the skin to the hip joint. Increased adjacent skin thickening and subcutaneous soft tissue  swelling. Increased periostitis and cortical irregularity along the greater trochanter concerning for osteomyelitis. Unchanged ununited intertrochanteric fracture after hardware removal. Electronically Signed   By: Maurine Simmering M.D.   On: 11/02/2021 10:41   Medications:  sodium chloride     sodium chloride 10 mL/hr at 09/19/21 1310   methocarbamol (ROBAXIN) IV      alosetron  1 mg Oral BID   amitriptyline  25 mg Oral QHS   amLODipine  10 mg Oral Q24H   atorvastatin  40 mg Oral q1800   carvedilol  6.25 mg Oral BID   cyclobenzaprine  10 mg Oral TID   docusate sodium  100 mg Oral BID   DULoxetine  60 mg Oral Daily   famotidine  20 mg Oral Daily   furosemide  80 mg Oral Daily   heparin  5,000 Units Subcutaneous Q8H   hydrALAZINE  25 mg Oral TID   insulin aspart  0-6 Units Subcutaneous TID WC   insulin aspart  3 Units Subcutaneous TID WC   insulin glargine-yfgn  5 Units Subcutaneous QHS   multivitamin  1 tablet Oral QHS   pantoprazole  80 mg Oral Daily   sevelamer carbonate  2,400 mg Oral TID WC   sodium chloride flush  3 mL Intravenous Q12H     Outpatient Dialysis Orders: MWF at [facility], 400/500, EDW 90kg, 2K/2Ca, LUE AVF,  -heparin 4000 unit IV bolus    Assessment/Plan: 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 Back to OR 08/28/21, 08/31/21 for L hip debrid. and wound vac placement (now removed). Finished 6 week course of IV Daptomycin on 10/12/21. Management per primary team 2. ESRD: MWF schedule.  Next HD 11/04/2021.  3.  K+ 5.7 No hemolysis. Using 2.0 K bath, on renal diet. May need lokelma. Follow labs.  4. AVF Access: S/p branch ligation in OR 09/19/2021 per Dr. Virl Cagey. Was having intermittent cannulation issues but AVF now working well. 5. HTN/ volume: BP controlled. On amlodipine/hydralazine/Coreg. Still has edema but improved. Max UF as tolerated and has been reminded of importance of following fluid and sodium  restrictions.   6. Anemia of ESRD: Hgb at goal, not on ESA.  7. Secondary HPTH: Cca controlled. Phos improving on increased renvela dose. No VDRA for now. .  8. DM2: Insulin per primary 9. Dispo: SNF recommended but patient now wants to go home. Refused to sit in a recliner here but thinks she could tolerate it outpatient, I agree that there are no major physical barriers to her sitting in a chair and she agrees to try it tomorrow which she has not done! Always has an excuse.  Appreciate SW assistance, please see renal navigator notes.     Rosmery Duggin H. Braydan Marriott NP-C 11/03/2021, 8:27 AM  [employer] [phone]

## 2021-11-04 DIAGNOSIS — I10 Essential (primary) hypertension: Secondary | ICD-10-CM | POA: Diagnosis not present

## 2021-11-04 DIAGNOSIS — F419 Anxiety disorder, unspecified: Secondary | ICD-10-CM | POA: Diagnosis not present

## 2021-11-04 DIAGNOSIS — M86252 Subacute osteomyelitis, left femur: Secondary | ICD-10-CM | POA: Diagnosis not present

## 2021-11-04 DIAGNOSIS — N186 End stage renal disease: Secondary | ICD-10-CM | POA: Diagnosis not present

## 2021-11-04 LAB — CBC
HCT: 35.5 % — ABNORMAL LOW (ref 36.0–46.0)
Hemoglobin: 11.5 g/dL — ABNORMAL LOW (ref 12.0–15.0)
MCH: 28.2 pg (ref 26.0–34.0)
MCHC: 32.4 g/dL (ref 30.0–36.0)
MCV: 87 fL (ref 80.0–100.0)
Platelets: 213 10*3/uL (ref 150–400)
RBC: 4.08 MIL/uL (ref 3.87–5.11)
RDW: 15.1 % (ref 11.5–15.5)
WBC: 7.3 10*3/uL (ref 4.0–10.5)
nRBC: 0 % (ref 0.0–0.2)

## 2021-11-04 LAB — GLUCOSE, CAPILLARY
Glucose-Capillary: 119 mg/dL — ABNORMAL HIGH (ref 70–99)
Glucose-Capillary: 155 mg/dL — ABNORMAL HIGH (ref 70–99)
Glucose-Capillary: 102 mg/dL — ABNORMAL HIGH (ref 70–99)
Glucose-Capillary: 175 mg/dL — ABNORMAL HIGH (ref 70–99)

## 2021-11-04 LAB — RENAL FUNCTION PANEL
Albumin: 3 g/dL — ABNORMAL LOW (ref 3.5–5.0)
Anion gap: 12 (ref 5–15)
BUN: 82 mg/dL — ABNORMAL HIGH (ref 6–20)
CO2: 25 mmol/L (ref 22–32)
Calcium: 8.9 mg/dL (ref 8.9–10.3)
Chloride: 92 mmol/L — ABNORMAL LOW (ref 98–111)
Creatinine, Ser: 7.07 mg/dL — ABNORMAL HIGH (ref 0.44–1.00)
GFR, Estimated: 6 mL/min — ABNORMAL LOW (ref >60)
Glucose, Bld: 86 mg/dL (ref 70–99)
Phosphorus: 5.5 mg/dL — ABNORMAL HIGH (ref 2.5–4.6)
Potassium: 6.1 mmol/L — ABNORMAL HIGH (ref 3.5–5.1)
Sodium: 129 mmol/L — ABNORMAL LOW (ref 135–145)

## 2021-11-04 MED ORDER — HEPARIN SODIUM (PORCINE) 1000 UNIT/ML IJ SOLN
INTRAMUSCULAR | Status: AC
  Administered 2021-11-04: 1000 [IU]
  Filled 2021-11-04: qty 4

## 2021-11-04 MED ORDER — HEPARIN SODIUM (PORCINE) 1000 UNIT/ML IJ SOLN: 1000.0000 [IU] | INTRAMUSCULAR | Status: DC | PRN

## 2021-11-04 MED ORDER — PENTAFLUOROPROP-TETRAFLUOROETH EX AERO: 1.0000 | INHALATION_SPRAY | CUTANEOUS | Status: DC | PRN

## 2021-11-04 MED ORDER — HEPARIN SODIUM (PORCINE) 1000 UNIT/ML DIALYSIS
4000.0000 [IU] | Freq: Once | INTRAMUSCULAR | Status: DC
  Filled 2021-11-04: qty 4

## 2021-11-04 NOTE — Progress Notes (Signed)
Patient signed an early termination of her Hd treatment due to pain on her buttocks and left stump inspite of pre HD meds given to her.First time she was in HD recliner and able to seat for 2.5 hrs out of her 4 hours of treatment.

## 2021-11-04 NOTE — TOC Progression Note (Signed)
Transition of Care [facility]) - Initial/Assessment Note    Patient Details  Name: [name] MRN: [id number] Date of Birth: 1966-02-02  [facility] of [facility]) CM/SW Contact:    Milinda Antis, [place] Phone Number: 11/04/2021, 1:47 PM  Clinical Narrative:                 Patient continues to report being unable to sit for dialysis which is a barrier to discharge.  Patient wants to return home at discharge.   TOC will continue to follow  Expected Discharge Plan: [place] Barriers to Discharge: Continued Medical Work up   Patient Goals and CMS Choice Patient states their goals for this hospitalization and ongoing recovery are:: To return home CMS Medicare.gov Compare Post Acute Care list provided to:: Patient Choice offered to / list presented to : NA  Expected Discharge Plan and Services Expected Discharge Plan: [place]   Discharge Planning Services: CM Consult   Living arrangements for the past 2 months: Single Family Home                                      Prior Living Arrangements/Services Living arrangements for the past 2 months: Single Family Home Lives with:: Adult Children Patient language and need for interpreter reviewed:: Yes Do you feel safe going back to the place where you live?: Yes      Need for Family Participation in Patient Care: Yes (Comment) Care giver support system in place?: Yes (comment) Current home services: DME ([employer], w/c) Criminal Activity/Legal Involvement Pertinent to Current Situation/Hospitalization: No - Comment as needed  Activities of Daily Living Home Assistive Devices/Equipment: Eyeglasses, Wheelchair ADL Screening (condition at time of admission) Patient's cognitive ability adequate to safely complete daily activities?: Yes Is the patient deaf or have difficulty hearing?: No Does the patient have difficulty seeing, even when wearing glasses/contacts?: No Does the patient have  difficulty concentrating, remembering, or making decisions?: No Patient able to express need for assistance with ADLs?: Yes Does the patient have difficulty dressing or bathing?: Yes Independently performs ADLs?: No Communication: Independent Dressing (OT): Independent Is this a change from baseline?: Pre-admission baseline Grooming: Independent Feeding: Independent Bathing: Needs assistance Is this a change from baseline?: Pre-admission baseline Toileting: Needs assistance Is this a change from baseline?: Pre-admission baseline In/Out Bed: Needs assistance, Dependent Is this a change from baseline?: Pre-admission baseline Walks in Home: Dependent Is this a change from baseline?: Pre-admission baseline Does the patient have difficulty walking or climbing stairs?: Yes Weakness of Legs: Both Weakness of Arms/Hands: Both  Permission Sought/Granted Permission sought to share information with : Case Manager, Family Supports Permission granted to share information with : Yes, Verbal Permission Granted              Emotional Assessment Appearance:: Appears stated age Attitude/Demeanor/Rapport: Engaged, Gracious Affect (typically observed): Accepting, Appropriate, Calm, Hopeful Orientation: : Oriented to Self, Oriented to Place, Oriented to  Time, Oriented to Situation Alcohol / Substance Use: Not Applicable Psych Involvement: No (comment)  Admission diagnosis:  Wound infection [T14.8XXA, L08.9] Abscess of left hip [L02.416] Patient Active Problem List   Diagnosis Date Noted   Wound dehiscence, surgical, sequela    Abscess of left hip 08/26/2021   History of CVA (cerebrovascular accident) 78/29/5621   Hardware complicating wound infection ([place])    Subacute osteomyelitis of left femur with abscess  Septic arthritis of hip ([place]) 07/30/2021   Skin abscess L hip 07/28/2021   HCAP (healthcare-associated pneumonia) 07/27/2021   Hyperkalemia 63/03/6008   Metabolic acidemia  93/23/5573   Closed fracture of left distal femur ([address]) 07/22/2021   Type 2 diabetes mellitus with hyperlipidemia ([place]) 07/22/2021   Infection of superficial incisional surgical site after procedure 03/09/2020   Abscess    Hypoglycemia    Essential hypertension    Sleep disturbance    ESRD (end stage renal disease) on dialysis ([place]) 01/24/2020   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[place]) 01/23/2020   Hyponatremia    Constipation    Chronic osteomyelitis involving left ankle and foot ([place])    Ulcer of left foot with necrosis of bone ([place])    Wound infection 12/28/2019   History of [place] amputation of left foot ([place]) 12/25/2017   History of CVA (cerebrovascular accident) without residual deficits 07/04/2017   Cerebral thrombosis with cerebral infarction 04/08/2017   Right sided weakness 04/07/2017   Hyperhidrosis 09/01/2016   Migraine with aura and without status migrainosus, not intractable 07/28/2016   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[place]) 08/04/2014   CKD stage 3 due to type 2 diabetes mellitus ([place]) 06/27/2014   Vitamin D insufficiency 05/08/2014   Obesity (BMI 30.0-34.9) 05/08/2014   Unilateral amputation of left foot ([place]) 05/08/2014   Bursitis of left shoulder 02/14/2014   Neck pain 01/03/2014   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.23) 01/14/2013   Insomnia 08/04/2012   Diabetic neuropathy, painful ([place]) 08/04/2011   Anxiety and depression 05/16/2010   Female stress incontinence 11/01/2007   TOBACCO DEPENDENCE 04/30/2006   GASTROESOPHAGEAL REFLUX, NO ESOPHAGITIS 04/30/2006   Irritable bowel syndrome 04/30/2006   PCP:  [name], MD Pharmacy:   [facility], [place] [address] [place] [address] Phone: [phone] Fax: [phone]  [id number] - [place], [place] [address] [place] [address] Phone: [phone] Fax: [phone]     Social Determinants of Health (SDOH) Interventions    Readmission Risk Interventions    07/26/2021    3:15 PM  Readmission Risk Prevention Plan  Transportation Screening Complete  PCP or Specialist Appt within 3-5 Days Complete  HRI or Home Care Consult Complete  Palliative Care Screening Not Applicable  Medication Review (RN Care Manager) Referral to Pharmacy

## 2021-11-04 NOTE — Progress Notes (Signed)
Patient ID: [name], female   DOB: 1965-04-28, 56 y.o.   MRN: [id number] Patient is seen in follow-up with small amount of drainage from her left hip.  There is no cellulitis around the hip there is no tenderness to palpation.  There is clear drainage consistent with lymphatic fluid.  The ulcer is slowly closing.  I will follow-up as an outpatient.

## 2021-11-04 NOTE — Progress Notes (Signed)
Mobility Specialist Criteria Algorithm Info.   11/04/21 1320  Mobility  Activity Transferred from bed to chair  Range of Motion/Exercises Passive;Left leg  Level of Assistance +2 (takes two people)  Assistive Device Other (Comment) (Bed pads)  LLE Weight Bearing NWB  Activity Response Tolerated well   Patient received in supine requesting assistance to recliner chair for HD treatment. Was mod A +2 to slide transfer from bed to recliner. No slide boards were available so utilized bed pads to slide patient over. Tolerated without complaint or incident. Was left in chair with all needs met, call bell in reach.   [place] Phillp Dolores, [place], [place]  KEUVH:[phone] Office: [phone]

## 2021-11-04 NOTE — Progress Notes (Signed)
PROGRESS NOTE    [name]  EYC:[id number] DOB: 14-Jan-1966 DOA: 08/26/2021 PCP: [name], MD   Brief Narrative: [name] is a 56 y.o. female with a history of ESRD, anemia of CKD, diabetes mellitus type 2, hyperlipidemia, CVA, anxiety, depression, GERD, tobacco, left hip fracture s/p IM nail placement and recent hardware removal. Patient presented secondary to left hip bleeding and found to have evidence of subacute osteomyelitis and abscess of left femur. Orthopedic surgery/infectious disease consulted. Patient was started on empiric antibiotics and underwent multiple I&Ds. Patient completed a 6 week course of daptomycin. Discharge is pending ability for patient to tolerate HD in a chair which is complicated by patient's refusal/inability to sit in a chair secondary to pain.   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 Orthopedic surgery consulted. ID consulted. Patient underwent hardware removal on 6/10 (prior to admission) with I&D on 6/28 and 7/1. Infectious disease recommendations for 6 weeks of Daptomycin; patient has completed course. Patient still with pain. Afebrile. No leukocytosis. CT hip with fluid collection and soft tissue thickening. Orthopedic surgery consulted and evaluated. CRP/sed rate improved from prior at 0.6/36 respectively. Unlikely persistent infection. Recommendation for follow-up with Dr. [name] within 1 week of discharge.  History of left intertrochanteric fracture This was previously managed with IM nail placement in 2021. Hardware removed secondary to above.  ESRD on HD Nephrology on board. -HD per nephrology  Anasarca Improved with hemodialysis.  Hyponatremia Management with hemodialysis.  Hypokalemia Hyperkalemia Management with hemodialysis  Anemia of chronic disease Acute blood loss anemia In setting of CKD in addition to blood loss  from wound. Patient has received a total of 2 units of PRBC via transfusion. Hemoglobin stable.  Diabetes mellitus type 2 Diabetes is uncontrolled with hyperglycemia. Most recent hemoglobin A1C of 7.1%. Patient is on insulin glargine and insulin aspart as an outpatient. -Continue Semglee 5 units qHS, Novolog 3 units TID with meals and SSI  Primary hypertension -Continue amlodipine, hydralazine and Coreg  Hyperlipidemia History of CVA -Continue Lipitor  GERD -Continue Pepcid and Protonix  Tobacco use Tobacco cessation discussed during this admission.  DVT prophylaxis: Heparin subq Code Status:   Code Status: Full Code Family Communication: None at bedside Disposition Plan: Discharge to SNF pending ability to tolerate HD sitting up   Consultants:  Orthopedic surgery Infectious disease Nephrology Vascular surgery  Procedures:  Vascular ultrasound duplex dialysis access 09/07/2021 Fistula revision branch ligation per Dr. Unk Lightning 09/19/2021- Left hip debridement, application of wound VAC by Dr. [name] 08/28/2021  Antimicrobials: Daptomycin Cefazolin    Subjective: No issues overnight. Plan for HD in a chair today.  Objective: BP (!) 151/63 (BP Location: Right Arm)   Pulse 60   Temp 97.8 F (36.6 C)   Resp 16   Ht 5\' 10"  (1.778 m)   Wt 90.3 kg   SpO2 100%   BMI 28.56 kg/m   Examination:  General exam: Appears calm and comfortable Respiratory system: Respiratory effort normal. Gastrointestinal system: Abdomen is non-distended. Central nervous system: Alert and oriented. Skin: No cyanosis. No rashes   Data Reviewed: I have personally reviewed following labs and imaging studies  CBC Lab Results  Component Value Date   WBC 5.5 11/01/2021   RBC 4.07 11/01/2021   HGB 11.4 (L) 11/01/2021   HCT 36.1 11/01/2021   MCV 88.7 11/01/2021   MCH 28.0 11/01/2021   PLT 206 11/01/2021  MCHC 31.6 11/01/2021   RDW 15.1 11/01/2021   LYMPHSABS 2.0 10/23/2021   MONOABS  1.1 (H) 10/23/2021   EOSABS 0.3 10/23/2021   BASOSABS 0.1 14/97/0263     Last metabolic panel Lab Results  Component Value Date   NA 131 (L) 11/01/2021   K 5.7 (H) 11/01/2021   CL 91 (L) 11/01/2021   CO2 25 11/01/2021   BUN 69 (H) 11/01/2021   CREATININE 6.50 (H) 11/01/2021   GLUCOSE 101 (H) 11/01/2021   GFRNONAA 7 (L) 11/01/2021   GFRAA 13 (L) 12/17/2018   CALCIUM 9.4 11/01/2021   PHOS 6.5 (H) 11/01/2021   PROT 6.6 10/02/2021   ALBUMIN 2.8 (L) 11/01/2021   LABGLOB 3.1 10/29/2018   AGRATIO 1.4 10/29/2018   BILITOT 0.5 10/02/2021   ALKPHOS 112 10/02/2021   AST 20 10/02/2021   ALT 14 10/02/2021   ANIONGAP 15 11/01/2021    GFR: Estimated Creatinine Clearance: 12.1 mL/min (A) (by C-G formula based on SCr of 6.5 mg/dL (H)).  No results found for this or any previous visit (from the past 240 hour(s)).    Radiology Studies: No results found.    LOS: 70 days    Cordelia Poche, MD [facility] 11/04/2021, 11:38 AM   If 7PM-7AM, please contact night-coverage [email or web address]

## 2021-11-04 NOTE — Progress Notes (Addendum)
[facility] Progress Note   Subjective: Markedly increased edema in both hips today as compared to yesterday. C/O pain in hips.  Seen by Dr. [name], no further interventions planned. HD later today.      Objective Vitals:   11/03/21 0952 11/03/21 1634 11/03/21 2132 11/04/21 0510  BP: (!) 144/56 (!) 150/57 (!) 134/54 (!) 151/63  Pulse: 61 (!) 57 (!) 59 60  Resp: 18 18 17 16   Temp: 98.2 F (36.8 C) 98.2 F (36.8 C) 98.3 F (36.8 C) 97.8 F (36.6 C)  TempSrc:      SpO2: 96% 98% 98% 100%  Weight:      Height:       Physical Exam General: Obese female in NAD Heart:S1,S2 No M/R/G. RRR. SR on monitor.  Lungs: CTAB anteriorly Abdomen:NT, ND NABS Extremities: L BKA No stump edema, Increased edema upper thighs and hips 2+ non pitting.  No RLE edema Dialysis Access: L AVF + T/B   Additional Objective Labs: Basic Metabolic Panel: Recent Labs  Lab 10/30/21 1347 11/01/21 0700  NA 127* 131*  K 5.8* 5.7*  CL 92* 91*  CO2 24 25  GLUCOSE 127* 101*  BUN 72* 69*  CREATININE 6.70* 6.50*  CALCIUM 9.0 9.4  PHOS 5.8* 6.5*   Liver Function Tests: Recent Labs  Lab 10/30/21 1347 11/01/21 0700  ALBUMIN 2.9* 2.8*   No results for input(s): "LIPASE", "AMYLASE" in the last 168 hours. CBC: Recent Labs  Lab 10/30/21 1347 11/01/21 0700  WBC 5.9 5.5  HGB 11.5* 11.4*  HCT 35.5* 36.1  MCV 87.7 88.7  PLT 198 206   Blood Culture    Component Value Date/Time   SDES TISSUE 08/28/2021 0917   SPECREQUEST LEFT THIGH TISSUE 08/28/2021 0917   CULT  08/28/2021 0917    MODERATE ENTEROCOCCUS FAECIUM VANCOMYCIN RESISTANT ENTEROCOCCUS ISOLATED NO ANAEROBES ISOLATED Sent to [place] for further susceptibility testing. Performed at [facility], [place] [address], [place], [place] [address]    REPTSTATUS 09/02/2021 FINAL 08/28/2021 1696    Cardiac Enzymes: No results for input(s): "CKTOTAL", "CKMB", "CKMBINDEX", "TROPONINI" in the last 168 hours. CBG: Recent Labs  Lab  11/03/21 1150 11/03/21 1634 11/03/21 2132 11/04/21 0725 11/04/21 1132  GLUCAP 142* 142* 163* 119* 155*   Iron Studies: No results for input(s): "IRON", "TIBC", "TRANSFERRIN", "FERRITIN" in the last 72 hours. @lablastinr3 @ Studies/Results: No results found. Medications:  sodium chloride     sodium chloride 10 mL/hr at 09/19/21 1310   methocarbamol (ROBAXIN) IV      alosetron  1 mg Oral BID   amitriptyline  25 mg Oral QHS   amLODipine  10 mg Oral Q24H   atorvastatin  40 mg Oral q1800   carvedilol  6.25 mg Oral BID   cyclobenzaprine  10 mg Oral TID   docusate sodium  100 mg Oral BID   DULoxetine  60 mg Oral Daily   famotidine  20 mg Oral Daily   furosemide  80 mg Oral Daily   heparin  5,000 Units Subcutaneous Q8H   hydrALAZINE  25 mg Oral TID   insulin aspart  0-6 Units Subcutaneous TID WC   insulin aspart  3 Units Subcutaneous TID WC   insulin glargine-yfgn  5 Units Subcutaneous QHS   multivitamin  1 tablet Oral QHS   pantoprazole  80 mg Oral Daily   sevelamer carbonate  2,400 mg Oral TID WC   sodium chloride flush  3 mL Intravenous Q12H  Outpatient Dialysis Orders: MWF at [facility], 400/500, EDW 90kg, 2K/2Ca, LUE AVF,  -heparin 4000 unit IV bolus     Assessment/Plan: 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 Back to OR 08/28/21, 08/31/21 for L hip debrid. and wound vac placement (now removed). Finished 6 week course of IV Daptomycin on 10/12/21. Management per primary team 2. ESRD: MWF schedule.  HD today on schedule. Next HD 11/06/2021.  3. Last  K+ 5.7 11/01/2021.  No hemolysis. Using 2.0 K bath, on renal diet. May need lokelma. Follow labs.  4. AVF Access: S/p branch ligation in OR 09/19/2021 per Dr. Virl Cagey. Was having intermittent cannulation issues but AVF now working well. 5. HTN/ volume: BP controlled. On amlodipine/hydralazine/Coreg. Still has edema but improved. Max UF as tolerated and has been reminded of  importance of following fluid and sodium restrictions.   6. Anemia of ESRD: Hgb at goal, not on ESA.  7. Secondary HPTH: Cca controlled. Phos improving on increased renvela dose. No VDRA for now. .  8. DM2: Insulin per primary 9. Dispo: SNF recommended but patient now wants to go home. Refused to sit in a recliner here but thinks she could tolerate it outpatient.  We have no evidence to support her claim. She has rarely been in recliner for dialysis. She agreed with PA 10/31/2021 to sit in chair for HD which wasn't done 11/01/2021. Try again today. Appreciate SW assistance, please see renal navigator notes. Addendum 11/04/2021: Patient presented to HD in recliner.   Tylan Kinn H. Daphnee Preiss NP-C 11/04/2021, 12:09 PM  [employer] [phone]

## 2021-11-05 DIAGNOSIS — N186 End stage renal disease: Secondary | ICD-10-CM | POA: Diagnosis not present

## 2021-11-05 DIAGNOSIS — F419 Anxiety disorder, unspecified: Secondary | ICD-10-CM | POA: Diagnosis not present

## 2021-11-05 DIAGNOSIS — I10 Essential (primary) hypertension: Secondary | ICD-10-CM | POA: Diagnosis not present

## 2021-11-05 DIAGNOSIS — M86252 Subacute osteomyelitis, left femur: Secondary | ICD-10-CM | POA: Diagnosis not present

## 2021-11-05 LAB — GLUCOSE, CAPILLARY
Glucose-Capillary: 194 mg/dL — ABNORMAL HIGH (ref 70–99)
Glucose-Capillary: 140 mg/dL — ABNORMAL HIGH (ref 70–99)
Glucose-Capillary: 123 mg/dL — ABNORMAL HIGH (ref 70–99)
Glucose-Capillary: 144 mg/dL — ABNORMAL HIGH (ref 70–99)

## 2021-11-05 MED ORDER — PROSOURCE PLUS PO LIQD
30.0000 mL | Freq: Two times a day (BID) | ORAL | Status: DC
Start: 2021-11-05 — End: 2022-03-18
  Administered 2021-11-05 – 2022-03-18 (×209): 30 mL via ORAL
  Filled 2021-11-05 (×213): qty 30

## 2021-11-05 MED ORDER — SODIUM ZIRCONIUM CYCLOSILICATE 10 G PO PACK
10.0000 g | PACK | ORAL | Status: DC
  Administered 2021-11-05 – 2021-11-26 (×13): 10 g via ORAL
  Filled 2021-11-05 (×14): qty 1

## 2021-11-05 NOTE — Progress Notes (Signed)
PROGRESS NOTE    [name]  QJF:[id number] DOB: January 31, 1966 DOA: 08/26/2021 PCP: [name], MD   Brief Narrative: [name] is a 56 y.o. female with a history of ESRD, anemia of CKD, diabetes mellitus type 2, hyperlipidemia, CVA, anxiety, depression, GERD, tobacco, left hip fracture s/p IM nail placement and recent hardware removal. Patient presented secondary to left hip bleeding and found to have evidence of subacute osteomyelitis and abscess of left femur. Orthopedic surgery/infectious disease consulted. Patient was started on empiric antibiotics and underwent multiple I&Ds. Patient completed a 6 week course of daptomycin. Discharge is pending ability for patient to tolerate HD in a chair which is complicated by patient's refusal/inability to sit in a chair secondary to pain. Patient tolerated HD in a chair on 9/4.   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 Orthopedic surgery consulted. ID consulted. Patient underwent hardware removal on 6/10 (prior to admission) with I&D on 6/28 and 7/1. Infectious disease recommendations for 6 weeks of Daptomycin; patient has completed course. Patient still with pain. Afebrile. No leukocytosis. CT hip with fluid collection and soft tissue thickening. Orthopedic surgery consulted and evaluated. CRP/sed rate improved from prior at 0.6/36 respectively. Unlikely persistent infection. Recommendation for follow-up with Dr. [name] within 1 week of discharge.  History of left intertrochanteric fracture This was previously managed with IM nail placement in 2021. Hardware removed secondary to above.  ESRD on HD Nephrology on board. -HD per nephrology  Anasarca Improved with hemodialysis.  Hyponatremia Management with hemodialysis.  Hypokalemia Hyperkalemia Management with hemodialysis  Anemia of chronic disease Acute blood loss anemia In  setting of CKD in addition to blood loss from wound. Patient has received a total of 2 units of PRBC via transfusion. Hemoglobin stable.  Diabetes mellitus type 2 Diabetes is uncontrolled with hyperglycemia. Most recent hemoglobin A1C of 7.1%. Patient is on insulin glargine and insulin aspart as an outpatient. -Continue Semglee 5 units qHS, Novolog 3 units TID with meals and SSI  Primary hypertension -Continue amlodipine, hydralazine and Coreg  Hyperlipidemia History of CVA -Continue Lipitor  GERD -Continue Pepcid and Protonix  Tobacco use Tobacco cessation discussed during this admission.  DVT prophylaxis: Heparin subq Code Status:   Code Status: Full Code Family Communication: None at bedside Disposition Plan: Discharge to SNF. Medically stable for discharge.   Consultants:  Orthopedic surgery Infectious disease Nephrology Vascular surgery  Procedures:  Vascular ultrasound duplex dialysis access 09/07/2021 Fistula revision branch ligation per Dr. Unk Lightning 09/19/2021- Left hip debridement, application of wound VAC by Dr. [name] 08/28/2021  Antimicrobials: Daptomycin Cefazolin    Subjective: Patient reports tolerating HD in a chair yesterday. No issues overnight.  Objective: BP (!) 152/60 (BP Location: Right Arm)   Pulse 63   Temp 97.6 F (36.4 C) (Oral)   Resp 18   Ht 5\' 10"  (1.778 m)   Wt 94.2 kg   SpO2 98%   BMI 29.80 kg/m   Examination:  General exam: Appears calm and comfortable Respiratory system: Respiratory effort normal. Central nervous system: Alert and oriented. No focal neurological deficits. Psychiatry: Judgement and insight appear normal. Mood & affect appropriate.    Data Reviewed: I have personally reviewed following labs and imaging studies  CBC Lab Results  Component Value Date   WBC 7.3 11/04/2021   RBC 4.08 11/04/2021   HGB 11.5 (L) 11/04/2021   HCT 35.5 (L) 11/04/2021   MCV 87.0  11/04/2021   MCH 28.2 11/04/2021   PLT 213  11/04/2021   MCHC 32.4 11/04/2021   RDW 15.1 11/04/2021   LYMPHSABS 2.0 10/23/2021   MONOABS 1.1 (H) 10/23/2021   EOSABS 0.3 10/23/2021   BASOSABS 0.1 37/50/5107     Last metabolic panel Lab Results  Component Value Date   NA 129 (L) 11/04/2021   K 6.1 (H) 11/04/2021   CL 92 (L) 11/04/2021   CO2 25 11/04/2021   BUN 82 (H) 11/04/2021   CREATININE 7.07 (H) 11/04/2021   GLUCOSE 86 11/04/2021   GFRNONAA 6 (L) 11/04/2021   GFRAA 13 (L) 12/17/2018   CALCIUM 8.9 11/04/2021   PHOS 5.5 (H) 11/04/2021   PROT 6.6 10/02/2021   ALBUMIN 3.0 (L) 11/04/2021   LABGLOB 3.1 10/29/2018   AGRATIO 1.4 10/29/2018   BILITOT 0.5 10/02/2021   ALKPHOS 112 10/02/2021   AST 20 10/02/2021   ALT 14 10/02/2021   ANIONGAP 12 11/04/2021    GFR: Estimated Creatinine Clearance: 11.3 mL/min (A) (by C-G formula based on SCr of 7.07 mg/dL (H)).  No results found for this or any previous visit (from the past 240 hour(s)).    Radiology Studies: No results found.    LOS: 71 days    Cordelia Poche, MD [facility] 11/05/2021, 3:36 PM   If 7PM-7AM, please contact night-coverage [email or web address]

## 2021-11-05 NOTE — Progress Notes (Signed)
Occupational Therapy Treatment Patient Details Name: [name] MRN: [id number] DOB: Oct 01, 1965 Today's Date: 11/05/2021   History of present illness 56 y.o. female presented to ED 6/26 from dialysis with increased bloody drainage from L hip wound. 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 s/p hardware removal of left hip with partial resection of tuft tissue and femure that were nonviable on 08/10/21 Discharged home 6/23. underwent L hip debridement and wound vac placement 6/28; +Left intertrochanteric non-union,  Fracture of left distal femur  PMH: hypertension, hyperlipidemia, ESRD on HD MWF, history of left BKA in 2021, depression/anxiety, stroke, tobacco use, T2DM,  insomnia, chronic pain syndrome,   OT comments  Pt with slow progression towards goals, needing mod A for LB dressing, and min A for lateral scoot transfer to recliner with use of transfer board. Pt min A for bed mobility with bed flat, needing assist for trunk elevation. Pt presenting with impairments listed below, will follow acutely. Continue to recommend SNF at d/c.    Recommendations for follow up therapy are one component of a multi-disciplinary discharge planning process, led by the attending physician.  Recommendations may be updated based on patient status, additional functional criteria and insurance authorization.    Follow Up Recommendations  Skilled nursing-short term rehab (<3 hours/day)    Assistance Recommended at Discharge Intermittent Supervision/Assistance  Patient can return home with the following  A lot of help with bathing/dressing/bathroom;Assistance with cooking/housework;Assist for transportation;Help with stairs or ramp for entrance;Two people to help with walking and/or transfers   Equipment Recommendations  Other (comment) (TBD)    Recommendations for Other Services PT consult    Precautions / Restrictions Precautions Precautions: Fall;Other  (comment) Precaution Comments: L BKA (baseline) Restrictions Weight Bearing Restrictions: Yes LLE Weight Bearing: Non weight bearing Other Position/Activity Restrictions: L distal femur fx; per Dr [name] 8/10 pt can full WB       Mobility Bed Mobility Overal bed mobility: Needs Assistance Bed Mobility: Supine to Sit   Sidelying to sit: Min assist       General bed mobility comments: min A for trunk elevation    Transfers Overall transfer level: Needs assistance Equipment used: Sliding board Transfers: Bed to chair/wheelchair/BSC Sit to Stand: Min assist          Lateral/Scoot Transfers: Min assist       Balance Overall balance assessment: Needs assistance Sitting-balance support: Feet supported Sitting balance-Leahy Scale: Good Sitting balance - Comments: no assistance with sitting balance                                   ADL either performed or assessed with clinical judgement   ADL Overall ADL's : Needs assistance/impaired                     Lower Body Dressing: Moderate assistance;Sitting/lateral leans;Bed level Lower Body Dressing Details (indicate cue type and reason): donning/doffing brief and pants Toilet Transfer: Minimal assistance;Transfer board;BSC/3in1 Toilet Transfer Details (indicate cue type and reason): simulated to drop arm recliner         Functional mobility during ADLs: Minimal assistance      Extremity/Trunk Assessment Upper Extremity Assessment Upper Extremity Assessment: Overall WFL for tasks assessed   Lower Extremity Assessment Lower Extremity Assessment: Defer to PT evaluation        Vision   Vision Assessment?: No apparent visual deficits   Perception  Perception Perception: Not tested   Praxis Praxis Praxis: Not tested    Cognition Arousal/Alertness: Awake/alert Behavior During Therapy: WFL for tasks assessed/performed Overall Cognitive Status: Within Functional Limits for tasks  assessed Area of Impairment: Safety/judgement                         Safety/Judgement: Decreased awareness of deficits     General Comments: continues to be self limiting        Exercises      Shoulder Instructions       General Comments VSS on RA    Pertinent Vitals/ Pain       Pain Assessment Pain Assessment: Faces Pain Score: 8  Faces Pain Scale: Hurts whole lot Pain Location: headache and R knee Pain Descriptors / Indicators: Discomfort Pain Intervention(s): Limited activity within patient's tolerance  Home Living                                          Prior Functioning/Environment              Frequency  Min 2X/week        Progress Toward Goals  OT Goals(current goals can now be found in the care plan section)  Progress towards OT goals: Progressing toward goals  Acute Rehab OT Goals Patient Stated Goal: to get better OT Goal Formulation: With patient Time For Goal Achievement: 11/19/21 Potential to Achieve Goals: Fair ADL Goals Pt Will Perform Lower Body Dressing: with adaptive equipment;sitting/lateral leans;with supervision Pt Will Transfer to Toilet: with min assist;with +2 assist;bedside commode;with transfer board Pt/caregiver will Perform Home Exercise Program: Increased ROM;Increased strength;Both right and left upper extremity;With written HEP provided;Independently;With theraband Additional ADL Goal #2: Pt will be able to don prosthesis with min A in prep for ADLs/mobility  Plan Discharge plan remains appropriate;Frequency remains appropriate    Co-evaluation                 AM-PAC OT "6 Clicks" Daily Activity     Outcome Measure   Help from another person eating meals?: None Help from another person taking care of personal grooming?: None Help from another person toileting, which includes using toliet, bedpan, or urinal?: A Lot Help from another person bathing (including washing, rinsing,  drying)?: A Lot Help from another person to put on and taking off regular upper body clothing?: A Little Help from another person to put on and taking off regular lower body clothing?: A Lot 6 Click Score: 17    End of Session Equipment Utilized During Treatment: Other (comment) (transfer board)  OT Visit Diagnosis: Muscle weakness (generalized) (M62.81);Unsteadiness on feet (R26.81);History of falling (Z91.81)   Activity Tolerance Patient tolerated treatment well   Patient Left in chair;with call bell/phone within reach;with chair alarm set   Nurse Communication Mobility status        Time: 2482-5003 OT Time Calculation (min): 33 min  Charges: OT General Charges $OT Visit: 1 Visit OT Treatments $Self Care/Home Management : 8-22 mins $Therapeutic Activity: 8-22 mins  Lynnda Child, OTD, OTR/L Acute Rehab [phone] - [place] 11/05/2021, 2:24 PM

## 2021-11-05 NOTE — Plan of Care (Signed)
  Problem: Activity: Goal: Risk for activity intolerance will decrease Outcome: Progressing   Problem: Pain Managment: Goal: General experience of comfort will improve Outcome: Progressing   

## 2021-11-05 NOTE — Progress Notes (Signed)
[facility] Progress Note   Subjective:  Seen in room. No CP/dyspnea, says L hip is very sore. Dialyzed partially in recliner yesterday - she signed off her treatment early due to pain.  Objective Vitals:   11/04/21 1746 11/04/21 1746 11/04/21 2125 11/05/21 0616  BP:  132/72 90/71 (!) 152/60  Pulse:  62 (!) 59 63  Resp:  18 18 18   Temp:  98.6 F (37 C) 98.3 F (36.8 C) 97.6 F (36.4 C)  TempSrc:    Oral  SpO2:  100% 98% 98%  Weight: 94.2 kg     Height:       Physical Exam General: Well appearing woman, NAD. Room air. Heart: RRR; no murmur Lungs: CTAB; no rales Abdomen: soft Extremities: L BKA; 1+ B thigh edema Dialysis Access: L AVF + thrill  Additional Objective Labs: Basic Metabolic Panel: Recent Labs  Lab 10/30/21 1347 11/01/21 0700 11/04/21 1437  NA 127* 131* 129*  K 5.8* 5.7* 6.1*  CL 92* 91* 92*  CO2 24 25 25   GLUCOSE 127* 101* 86  BUN 72* 69* 82*  CREATININE 6.70* 6.50* 7.07*  CALCIUM 9.0 9.4 8.9  PHOS 5.8* 6.5* 5.5*   Liver Function Tests: Recent Labs  Lab 10/30/21 1347 11/01/21 0700 11/04/21 1437  ALBUMIN 2.9* 2.8* 3.0*   CBC: Recent Labs  Lab 10/30/21 1347 11/01/21 0700 11/04/21 1437  WBC 5.9 5.5 7.3  HGB 11.5* 11.4* 11.5*  HCT 35.5* 36.1 35.5*  MCV 87.7 88.7 87.0  PLT 198 206 213   Blood Culture    Component Value Date/Time   SDES TISSUE 08/28/2021 [place] TISSUE 08/28/2021 0917   CULT  08/28/2021 0917    MODERATE ENTEROCOCCUS FAECIUM VANCOMYCIN RESISTANT ENTEROCOCCUS ISOLATED NO ANAEROBES ISOLATED Sent to [place] for further susceptibility testing. Performed at [facility], [place] [address], [place], [place] [address]    REPTSTATUS 09/02/2021 FINAL 08/28/2021 3295   Medications:  sodium chloride     sodium chloride 10 mL/hr at 09/19/21 1310   methocarbamol (ROBAXIN) IV      alosetron  1 mg Oral BID   amitriptyline  25 mg Oral QHS   amLODipine  10 mg Oral Q24H   atorvastatin   40 mg Oral q1800   carvedilol  6.25 mg Oral BID   cyclobenzaprine  10 mg Oral TID   docusate sodium  100 mg Oral BID   DULoxetine  60 mg Oral Daily   famotidine  20 mg Oral Daily   furosemide  80 mg Oral Daily   heparin  5,000 Units Subcutaneous Q8H   hydrALAZINE  25 mg Oral TID   insulin aspart  0-6 Units Subcutaneous TID WC   insulin aspart  3 Units Subcutaneous TID WC   insulin glargine-yfgn  5 Units Subcutaneous QHS   multivitamin  1 tablet Oral QHS   pantoprazole  80 mg Oral Daily   sevelamer carbonate  2,400 mg Oral TID WC   sodium chloride flush  3 mL Intravenous Q12H    Dialysis Orders: MWF at [facility], 400/500, EDW 90kg, 2K/2Ca, LUE AVF, heparin 4000 unit IV bolus - No ESA   Assessment/Plan: 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 Back to OR 08/28/21, 08/31/21 for L hip debridement and wound vac placement (now removed). Finished 6 week course of IV Daptomycin on 10/12/21. Management per primary team and ortho. ESRD: Continue HD on usual MWF schedule - HD tomorrow. Hyperkalemia:  Recurrent issues, on 2K bath + furosemide. Adding Lokelma on non-HD days. AVF dysfunction: S/p branch ligation in OR 09/19/2021 per Dr. Virl Cagey. Was having intermittent cannulation issues, now working well. HTN/ volume: BP controlled on amlodipine/hydralazine/Coreg. Edema slowly improving. Anemia of ESRD: Hgb at goal, not on ESA.  Secondary HPTH: Ca/Phos ok. Not on VDRA, will check PTH. Nutrition: Alb low, adding protein supplements. DM2: Insulin per primary Dispo: SNF previously recommended, now she would like to go home on discharge. Dialyzed in recliner on 9/4 (although signed off HD early after 2.5hr) - try again tomorrow.   Donna Penton, PA-C 11/05/2021, 9:47 AM  [employer]

## 2021-11-05 NOTE — Progress Notes (Signed)
Physical Therapy Treatment Patient Details Name: [name] MRN: [id number] DOB: [birth year] Today's Date: 11/05/2021   History of Present Illness 56 y.o. female presented to ED 6/26 from dialysis with increased bloody drainage from L hip wound. 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 s/p hardware removal of left hip with partial resection of tuft tissue and femure that were nonviable on 08/10/21 Discharged home 6/23. underwent L hip debridement and wound vac placement 6/28; +Left intertrochanteric non-union,  Fracture of left distal femur  PMH: hypertension, hyperlipidemia, ESRD on HD MWF, history of left BKA in 2021, depression/anxiety, stroke, tobacco use, T2DM,  insomnia, chronic pain syndrome,    PT Comments    Patient up in chair on arrival (up with OT earlier today). Had been up 1.5 hours (and reports yesterday she spent ~4 hours in chair). Patient required min assist from chair up to bed via sliding board using bed pad to assist (~2" difference in height of surfaces). Again discussed her plans for discharge and she wants to stay here until hip wound is clear and she can stand and get into/out of her car. I explained that I thought a more realistic goal (with the limited therapy she gets in hospital) is to be able to get in/out of car via sliding board. Discussed trying to incorporate Mobility Team into her schedule to assist with up/back to chair and PT/OT spreading out their visits over 4 days per week. She is willing to work with therapy on HD days. Depending on progress, may be appropriate for AIR referral. Will discuss with OT.    Recommendations for follow up therapy are one component of a multi-disciplinary discharge planning process, led by the attending physician.  Recommendations may be updated based on patient status, additional functional criteria and insurance authorization.  Follow Up Recommendations  Skilled nursing-short term rehab (<3  hours/day) Can patient physically be transported by private vehicle: No   Assistance Recommended at Discharge Intermittent Supervision/Assistance  Patient can return home with the following A lot of help with bathing/dressing/bathroom;Assistance with cooking/housework;Assist for transportation;Two people to help with walking and/or transfers;Help with stairs or ramp for entrance   Equipment Recommendations  None recommended by PT    Recommendations for Other Services       Precautions / Restrictions Precautions Precautions: Fall;Other (comment) Precaution Comments: L BKA (baseline) Restrictions Weight Bearing Restrictions: Yes LLE Weight Bearing: Weight bearing as tolerated Other Position/Activity Restrictions: L distal femur fx; per Dr [name] 8/10 pt can full WB     Mobility  Bed Mobility   Bed Mobility: Sit to Supine       Sit to supine: Modified independent (Device/Increase time)   General bed mobility comments: EOB to supine no assist; +using rail    Transfers Overall transfer level: Needs assistance Equipment used: Sliding board Transfers: Bed to chair/wheelchair/BSC, Sit to/from Stand Sit to Stand: Min assist, Mod assist, +2 physical assistance, From elevated surface          Lateral/Scoot Transfers: Min assist, +2 safety/equipment, With slide board General transfer comment: lateral transfer going "uphill" from recliner to bed (~2 inch difference) using bed pad to assist; standing from EOB with RW, pt unable to stand x 1 attempt, completed last 2 attempts, time standing <15 seconds due to rt knee pain    Ambulation/Gait                   Stairs  Wheelchair Mobility    Modified Rankin (Stroke Patients Only)       Balance Overall balance assessment: Needs assistance Sitting-balance support: Feet supported Sitting balance-Leahy Scale: Good Sitting balance - Comments: no assistance with sitting balance   Standing balance  support: Bilateral upper extremity supported, During functional activity, Reliant on assistive device for balance Standing balance-Leahy Scale: Poor Standing balance comment: reliant on RW for support                            Cognition Arousal/Alertness: Awake/alert Behavior During Therapy: WFL for tasks assessed/performed Overall Cognitive Status: Within Functional Limits for tasks assessed                                          Exercises      General Comments General comments (skin integrity, edema, etc.): pt asked if daughter can bring in her rollator and try standing with it as she thinks it will be easier. Daughter will bring before Thursday      Pertinent Vitals/Pain Pain Assessment Pain Assessment: Faces Faces Pain Scale: Hurts whole lot Pain Location: R knee Pain Descriptors / Indicators: Discomfort Pain Intervention(s): Limited activity within patient's tolerance, Monitored during session    Home Living                          Prior Function            PT Goals (current goals can now be found in the care plan section) Acute Rehab PT Goals Patient Stated Goal: less pain with movement Time For Goal Achievement: 11/07/21 Potential to Achieve Goals: Fair Progress towards PT goals: Progressing toward goals    Frequency    Min 2X/week      PT Plan Current plan remains appropriate    Co-evaluation              AM-PAC PT "6 Clicks" Mobility   Outcome Measure  Help needed turning from your back to your side while in a flat bed without using bedrails?: None Help needed moving from lying on your back to sitting on the side of a flat bed without using bedrails?: None Help needed moving to and from a bed to a chair (including a wheelchair)?: A Little Help needed standing up from a chair using your arms (e.g., wheelchair or bedside chair)?: Total Help needed to walk in hospital room?: Total Help needed climbing  3-5 steps with a railing? : Total 6 Click Score: 14    End of Session Equipment Utilized During Treatment: Gait belt Activity Tolerance: Patient tolerated treatment well Patient left: in bed;with call bell/phone within reach   PT Visit Diagnosis: Other abnormalities of gait and mobility (R26.89);Muscle weakness (generalized) (M62.81);History of falling (Z91.81);Pain Pain - Right/Left: Left Pain - part of body: Leg     Time: 7290-2111 PT Time Calculation (min) (ACUTE ONLY): 29 min  Charges:  $Gait Training: 8-22 mins $Therapeutic Activity: 8-22 mins                      [place]  Office [phone]    [name] 11/05/2021, 3:20 PM

## 2021-11-06 DIAGNOSIS — M86252 Subacute osteomyelitis, left femur: Secondary | ICD-10-CM | POA: Diagnosis not present

## 2021-11-06 LAB — RENAL FUNCTION PANEL
Albumin: 2.9 g/dL — ABNORMAL LOW (ref 3.5–5.0)
Anion gap: 11 (ref 5–15)
BUN: 80 mg/dL — ABNORMAL HIGH (ref 6–20)
CO2: 25 mmol/L (ref 22–32)
Calcium: 8.9 mg/dL (ref 8.9–10.3)
Chloride: 92 mmol/L — ABNORMAL LOW (ref 98–111)
Creatinine, Ser: 6.86 mg/dL — ABNORMAL HIGH (ref 0.44–1.00)
GFR, Estimated: 7 mL/min — ABNORMAL LOW (ref >60)
Glucose, Bld: 154 mg/dL — ABNORMAL HIGH (ref 70–99)
Phosphorus: 5.6 mg/dL — ABNORMAL HIGH (ref 2.5–4.6)
Potassium: 5.7 mmol/L — ABNORMAL HIGH (ref 3.5–5.1)
Sodium: 128 mmol/L — ABNORMAL LOW (ref 135–145)

## 2021-11-06 LAB — CBC
HCT: 34.1 % — ABNORMAL LOW (ref 36.0–46.0)
Hemoglobin: 10.8 g/dL — ABNORMAL LOW (ref 12.0–15.0)
MCH: 27.8 pg (ref 26.0–34.0)
MCHC: 31.7 g/dL (ref 30.0–36.0)
MCV: 87.9 fL (ref 80.0–100.0)
Platelets: 189 10*3/uL (ref 150–400)
RBC: 3.88 MIL/uL (ref 3.87–5.11)
RDW: 15.2 % (ref 11.5–15.5)
WBC: 6.1 10*3/uL (ref 4.0–10.5)
nRBC: 0 % (ref 0.0–0.2)

## 2021-11-06 LAB — GLUCOSE, CAPILLARY
Glucose-Capillary: 115 mg/dL — ABNORMAL HIGH (ref 70–99)
Glucose-Capillary: 174 mg/dL — ABNORMAL HIGH (ref 70–99)
Glucose-Capillary: 188 mg/dL — ABNORMAL HIGH (ref 70–99)
Glucose-Capillary: 270 mg/dL — ABNORMAL HIGH (ref 70–99)

## 2021-11-06 NOTE — Progress Notes (Signed)
[facility] PROGRESS NOTE  Patient: [name] IRC:[id number]   PCP: [name], MD DOB: 1966-02-17   DOA: 08/26/2021   DOS: 11/06/2021    Subjective: Reports overall generalized soreness.  No nausea no vomiting no fever no chills.  No chest pain.  No back pain.  Objective:   Vitals:   11/06/21 1500 11/06/21 1530 11/06/21 1558 11/06/21 1759  BP: (!) 154/68 (!) 153/73 127/61 (!) 148/57  Pulse: (!) 59 (!) 59 62 71  Resp: (!) 25 14 15 16   Temp:   97.9 F (36.6 C) 98.2 F (36.8 C)  TempSrc:   Oral Oral  SpO2: 98% 99% 98% 95%  Weight:   95.8 kg   Height:        Clear to auscultation. S1-S2 present. Bowel sound present.  No abdominal tenderness.  Assessment and plan: Active Issues ESRD on HD. Patient tolerated HD in chair on 9/4.  On 9/6 patient's request HD in bed as she is sore overall after working with physical therapy. Monitor for now.  Electrolyte imbalance management per nephrology.  Disposition. Medically stable for discharge.  Discharge to SNF once the patient is able to tolerate HD in chair.   Brief Hospital course [name] is a 56 y.o. female with a history of ESRD, anemia of CKD, diabetes mellitus type 2, hyperlipidemia, CVA, anxiety, depression, GERD, tobacco, left hip fracture s/p IM nail placement and recent hardware removal. Patient presented secondary to left hip bleeding and found to have evidence of subacute osteomyelitis and abscess of left femur. Orthopedic surgery/infectious disease consulted. Patient was started on empiric antibiotics and underwent multiple I&Ds. Patient completed a 6 week course of daptomycin. Discharge is pending ability for patient to tolerate HD in a chair which is complicated by patient's refusal/inability to sit in a chair secondary to pain. Patient tolerated HD in a chair on 9/4.   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  Active Problems:   ESRD (end stage renal disease) on dialysis  ([place])   Type 2 diabetes mellitus with hyperlipidemia (HCC)   History of CVA (cerebrovascular accident)   Essential hypertension   Anxiety and depression   GASTROESOPHAGEAL REFLUX, NO ESOPHAGITIS   TOBACCO DEPENDENCE   Hyponatremia   Obesity (BMI 30.0-34.9)   Unilateral amputation of left foot (HCC)   Wound infection   History of CVA (cerebrovascular accident) without residual deficits   Hyperkalemia   Wound dehiscence, surgical, sequela    Author: Berle Mull, MD [facility] 11/06/2021 7:25 PM   If 7PM-7AM, please contact night-coverage at [email or web address]

## 2021-11-06 NOTE — Plan of Care (Signed)
  Problem: Activity: Goal: Risk for activity intolerance will decrease Outcome: Progressing   Problem: Pain Managment: Goal: General experience of comfort will improve Outcome: Progressing   

## 2021-11-06 NOTE — Progress Notes (Signed)
[facility] Progress Note   Subjective:  Seen in room. Reports still very sore and requesting HD in a bed today. Denies CP/dyspnea.   Objective Vitals:   11/05/21 0616 11/05/21 1650 11/05/21 2047 11/06/21 0539  BP: (!) 152/60 (!) 135/55 (!) 126/58 (!) 158/60  Pulse: 63 (!) 57 65 (!) 59  Resp: 18 18 18 18   Temp: 97.6 F (36.4 C) 98.2 F (36.8 C) 98.6 F (37 C) 98.6 F (37 C)  TempSrc: Oral  Oral Oral  SpO2: 98% 98% 96% 98%  Weight:      Height:       Physical Exam General: Well appearing woman, NAD. Room air. Heart: RRR; no murmur Lungs: CTAB; no rales Abdomen: soft Extremities: L BKA; 1+ B thigh edema Dialysis Access: L AVF + thrill  Additional Objective Labs: Basic Metabolic Panel: Recent Labs  Lab 10/30/21 1347 11/01/21 0700 11/04/21 1437  NA 127* 131* 129*  K 5.8* 5.7* 6.1*  CL 92* 91* 92*  CO2 24 25 25   GLUCOSE 127* 101* 86  BUN 72* 69* 82*  CREATININE 6.70* 6.50* 7.07*  CALCIUM 9.0 9.4 8.9  PHOS 5.8* 6.5* 5.5*   Liver Function Tests: Recent Labs  Lab 10/30/21 1347 11/01/21 0700 11/04/21 1437  ALBUMIN 2.9* 2.8* 3.0*   CBC: Recent Labs  Lab 10/30/21 1347 11/01/21 0700 11/04/21 1437  WBC 5.9 5.5 7.3  HGB 11.5* 11.4* 11.5*  HCT 35.5* 36.1 35.5*  MCV 87.7 88.7 87.0  PLT 198 206 213   Blood Culture    Component Value Date/Time   SDES TISSUE 08/28/2021 [place] TISSUE 08/28/2021 0917   CULT  08/28/2021 0917    MODERATE ENTEROCOCCUS FAECIUM VANCOMYCIN RESISTANT ENTEROCOCCUS ISOLATED NO ANAEROBES ISOLATED Sent to [place] for further susceptibility testing. Performed at [facility], [place] [address], [place], [place] [address]    REPTSTATUS 09/02/2021 FINAL 08/28/2021 6384   Medications:  sodium chloride     sodium chloride 10 mL/hr at 09/19/21 1310   methocarbamol (ROBAXIN) IV      (feeding supplement) PROSource Plus  30 mL Oral BID BM   alosetron  1 mg Oral BID   amitriptyline  25 mg Oral  QHS   amLODipine  10 mg Oral Q24H   atorvastatin  40 mg Oral q1800   carvedilol  6.25 mg Oral BID   cyclobenzaprine  10 mg Oral TID   docusate sodium  100 mg Oral BID   DULoxetine  60 mg Oral Daily   famotidine  20 mg Oral Daily   furosemide  80 mg Oral Daily   heparin  5,000 Units Subcutaneous Q8H   hydrALAZINE  25 mg Oral TID   insulin aspart  0-6 Units Subcutaneous TID WC   insulin aspart  3 Units Subcutaneous TID WC   insulin glargine-yfgn  5 Units Subcutaneous QHS   multivitamin  1 tablet Oral QHS   pantoprazole  80 mg Oral Daily   sevelamer carbonate  2,400 mg Oral TID WC   sodium chloride flush  3 mL Intravenous Q12H   sodium zirconium cyclosilicate  10 g Oral Once per day on Sun Tue Thu Sat    Dialysis Orders: MWF at [facility], 400/500, EDW 90kg, 2K/2Ca, LUE AVF, heparin 4000 unit IV bolus - No ESA   Assessment/Plan: 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 Back to OR 08/28/21, 08/31/21 for L hip debridement and wound vac placement (now removed).  Finished 6 week course of IV Daptomycin on 10/12/21. Management per primary team and ortho. ESRD: Continue HD on usual MWF schedule - HD today. Hyperkalemia: Recurrent issues, on 2K bath + furosemide. Lokelma 10g added on non-HD days. AVF dysfunction: S/p branch ligation in OR 09/19/2021 per Dr. Virl Cagey. Was having intermittent cannulation issues, now working well. HTN/ volume: BP controlled on amlodipine/hydralazine/Coreg. Edema slowly improving. Anemia of ESRD: Hgb at goal, not on ESA.  Secondary HPTH: Ca/Phos ok. Not on VDRA, will check PTH. Nutrition: Alb low, continue protein supplements. DM2: Insulin per primary Dispo: SNF previously recommended, now she would like to go home on discharge. Dialyzed in recliner on 9/4 (although signed off HD early after 2.5hr) - try again when agreeable.  Veneta Penton, PA-C 11/06/2021, 9:46 AM  [employer]

## 2021-11-06 NOTE — Progress Notes (Signed)
Received patient in bed to unit.  Alert and oriented.  Informed consent signed and in chart.   Treatment initiated: 1155 Treatment completed: 1558  Patient tolerated well.  Transported back to the room  Alert, without acute distress.  Hand-off given to patient's nurse. Sumin Bae  Access used: L AVG Access issues: n/a  Total UF removed: 4500 Medication(s) given: n/a Post HD VS: bp 127/61, 97.9,RR 26, P62 Post HD weight: 95.8KG   [facility]

## 2021-11-07 ENCOUNTER — Inpatient Hospital Stay (HOSPITAL_COMMUNITY): Payer: Medicare Other

## 2021-11-07 DIAGNOSIS — M86252 Subacute osteomyelitis, left femur: Secondary | ICD-10-CM | POA: Diagnosis not present

## 2021-11-07 LAB — GLUCOSE, CAPILLARY
Glucose-Capillary: 154 mg/dL — ABNORMAL HIGH (ref 70–99)
Glucose-Capillary: 133 mg/dL — ABNORMAL HIGH (ref 70–99)
Glucose-Capillary: 167 mg/dL — ABNORMAL HIGH (ref 70–99)
Glucose-Capillary: 141 mg/dL — ABNORMAL HIGH (ref 70–99)

## 2021-11-07 LAB — PARATHYROID HORMONE, INTACT (NO CA): PTH: 133 pg/mL — ABNORMAL HIGH (ref 15–65)

## 2021-11-07 MED ORDER — OXYCODONE HCL 5 MG PO TABS: 15.0000 mg | ORAL_TABLET | ORAL | Status: DC

## 2021-11-07 NOTE — Progress Notes (Signed)
Mobility Specialist Criteria Algorithm Info.   11/07/21 1245  Mobility  Activity Transferred from bed to chair  Range of Motion/Exercises Active;All extremities  Level of Assistance Moderate assist, patient does 50-74%  Assistive Device Sliding board  LLE Weight Bearing NWB  Activity Response Tolerated well   Patient received in supine eager to participate in mobility. Required mod A to transfer to recliner via slide board. Tolerated without complaint or incident. Was left in chair with all needs met, call bell in reach.   [place] Fianna Snowball, [place], [place]  BPQSO:[phone] Office: [phone]

## 2021-11-07 NOTE — Progress Notes (Signed)
[facility] PROGRESS NOTE  Patient: [name] MBW:[id number]   PCP: [name], MD DOB: 11/07/1965   DOA: 08/26/2021   DOS: 11/07/2021    Subjective: Continues both soreness all over.  No nausea or vomiting no fever no chills.  Objective:   Vitals:   11/06/21 2121 11/07/21 0511 11/07/21 1009 11/07/21 1705  BP: (!) 132/58 (!) 158/54 (!) 156/60 (!) 148/47  Pulse: 63 63 63 (!) 58  Resp: 16 18 17 16   Temp: 98.4 F (36.9 C) 98.5 F (36.9 C) 98.2 F (36.8 C) 98 F (36.7 C)  TempSrc: Oral Oral Oral   SpO2: 98% 97% 97% 100%  Weight:      Height:        Clear to auscultation. Bowel sound present. S1-S2 present.  Assessment and plan: Active Issues Generalized soreness.  Extra dose of oxycodone before surgery. May require long-acting oxycodone at nighttime. Recommend to have a outpatient relationship with pain management clinic.  Hip pain. Reiterated with the patient that per orthopedic as well as further work-up hip pain appears to be benign in nature and there is no acute issues for now. No change in therapy recommended.  Brief Hospital course [name] is a 56 y.o. female with a history of ESRD, anemia of CKD, diabetes mellitus type 2, hyperlipidemia, CVA, anxiety, depression, GERD, tobacco, left hip fracture s/p IM nail placement and recent hardware removal. Patient presented secondary to left hip bleeding and found to have evidence of subacute osteomyelitis and abscess of left femur. Orthopedic surgery/infectious disease consulted. Patient was started on empiric antibiotics and underwent multiple I&Ds. Patient completed a 6 week course of daptomycin. Discharge is pending ability for patient to tolerate HD in a chair which is complicated by patient's refusal/inability to sit in a chair secondary to pain. Patient tolerated HD in a chair on 9/4.   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  Active Problems:   ESRD (end stage renal  disease) on dialysis ([place])   Type 2 diabetes mellitus with hyperlipidemia (HCC)   History of CVA (cerebrovascular accident)   Essential hypertension   Anxiety and depression   GASTROESOPHAGEAL REFLUX, NO ESOPHAGITIS   TOBACCO DEPENDENCE   Hyponatremia   Obesity (BMI 30.0-34.9)   Unilateral amputation of left foot (HCC)   Wound infection   History of CVA (cerebrovascular accident) without residual deficits   Hyperkalemia   Wound dehiscence, surgical, sequela    Author: Berle Mull, MD [facility] 11/07/2021 7:14 PM   If 7PM-7AM, please contact night-coverage at [email or web address]

## 2021-11-07 NOTE — Progress Notes (Addendum)
[facility] Progress Note   Subjective:   Seen in room. Still sore, but otherwise ok. No CP/dyspnea.  Objective Vitals:   11/06/21 1759 11/06/21 2121 11/07/21 0511 11/07/21 1009  BP: (!) 148/57 (!) 132/58 (!) 158/54 (!) 156/60  Pulse: 71 63 63 63  Resp: 16 16 18 17   Temp: 98.2 F (36.8 C) 98.4 F (36.9 C) 98.5 F (36.9 C) 98.2 F (36.8 C)  TempSrc: Oral Oral Oral Oral  SpO2: 95% 98% 97% 97%  Weight:      Height:       Physical Exam General: Well appearing woman, NAD. Room air. Heart: RRR; no murmur Lungs: CTAB; no rales Abdomen: soft Extremities: L BKA with stump edema; 1+ B thigh edema Dialysis Access: L AVF + thrill  Additional Objective Labs: Basic Metabolic Panel: Recent Labs  Lab 11/01/21 0700 11/04/21 1437 11/06/21 1158  NA 131* 129* 128*  K 5.7* 6.1* 5.7*  CL 91* 92* 92*  CO2 25 25 25   GLUCOSE 101* 86 154*  BUN 69* 82* 80*  CREATININE 6.50* 7.07* 6.86*  CALCIUM 9.4 8.9 8.9  PHOS 6.5* 5.5* 5.6*   Liver Function Tests: Recent Labs  Lab 11/01/21 0700 11/04/21 1437 11/06/21 1158  ALBUMIN 2.8* 3.0* 2.9*   CBC: Recent Labs  Lab 11/01/21 0700 11/04/21 1437 11/06/21 1001  WBC 5.5 7.3 6.1  HGB 11.4* 11.5* 10.8*  HCT 36.1 35.5* 34.1*  MCV 88.7 87.0 87.9  PLT 206 213 189   Medications:  sodium chloride     sodium chloride 10 mL/hr at 09/19/21 1310   methocarbamol (ROBAXIN) IV      (feeding supplement) PROSource Plus  30 mL Oral BID BM   alosetron  1 mg Oral BID   amitriptyline  25 mg Oral QHS   amLODipine  10 mg Oral Q24H   atorvastatin  40 mg Oral q1800   carvedilol  6.25 mg Oral BID   cyclobenzaprine  10 mg Oral TID   docusate sodium  100 mg Oral BID   DULoxetine  60 mg Oral Daily   famotidine  20 mg Oral Daily   furosemide  80 mg Oral Daily   heparin  5,000 Units Subcutaneous Q8H   hydrALAZINE  25 mg Oral TID   insulin aspart  0-6 Units Subcutaneous TID WC   insulin aspart  3 Units Subcutaneous TID WC   insulin  glargine-yfgn  5 Units Subcutaneous QHS   multivitamin  1 tablet Oral QHS   pantoprazole  80 mg Oral Daily   sevelamer carbonate  2,400 mg Oral TID WC   sodium chloride flush  3 mL Intravenous Q12H   sodium zirconium cyclosilicate  10 g Oral Once per day on Sun Tue Thu Sat    Dialysis Orders: MWF at [facility], 400/500, EDW 90kg, 2K/2Ca, LUE AVF, heparin 4000 unit IV bolus - No ESA   Assessment/Plan: 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 Back to OR 08/28/21, 08/31/21 for L hip debridement and wound vac placement (now removed). Finished 6 week course of IV Daptomycin on 10/12/21. Management per primary team and ortho. ESRD: Continue HD on usual MWF schedule - next tomorrow. Hyperkalemia: Recurrent issues, on 2K bath + furosemide. Lokelma 10g added on non-HD days. AVF dysfunction: S/p branch ligation in OR 09/19/2021 per Dr. Virl Cagey. Was having intermittent cannulation issues, now working well. HTN/ volume: BP controlled on amlodipine/hydralazine/Coreg. Edema still present  - reminded on fluid restrictions. Anemia of ESRD:  Hgb at goal, not on ESA.  Secondary HPTH: Ca/Phos ok. Not on VDRA, will check PTH. Nutrition: Alb low, continue protein supplements. DM2: Insulin per primary Dispo: SNF previously recommended, now she would like to go home on discharge. Dialyzed in recliner on 9/4 (although signed off HD early after 2.5hr) - try again when agreeable (refused on 9/6).    Veneta Penton, PA-C 11/07/2021, 10:59 AM  [employer]

## 2021-11-07 NOTE — Progress Notes (Signed)
Physical Therapy Treatment Patient Details Name: [name] MRN: [id number] DOB: [birth year] Today's Date: 11/07/2021   History of Present Illness 56 y.o. female presented to ED 6/26 from dialysis with increased bloody drainage from L hip wound. 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 s/p hardware removal of left hip with partial resection of tuft tissue and femure that were nonviable on 08/10/21 Discharged home 6/23. underwent L hip debridement and wound vac placement 6/28; +Left intertrochanteric non-union,  Fracture of left distal femur  PMH: hypertension, hyperlipidemia, ESRD on HD MWF, history of left BKA in 2021, depression/anxiety, stroke, tobacco use, T2DM,  insomnia, chronic pain syndrome,    PT Comments    Patient up in chair x 1.5 hours prior to PT arrival. Able to transfer chair to bed with sliding board and minguard assist (after board placed for her) up to surface ~2" higher than chair. 2 of 4 successful attempts at standing from elevated EOB to RW (trying different positions of therapist/tech as pt feeling crowded and unable to full push up with her arms). Use of bed pad under hips greatly helpful.     Recommendations for follow up therapy are one component of a multi-disciplinary discharge planning process, led by the attending physician.  Recommendations may be updated based on patient status, additional functional criteria and insurance authorization.  Follow Up Recommendations  Skilled nursing-short term rehab (<3 hours/day) Can patient physically be transported by private vehicle: No   Assistance Recommended at Discharge Intermittent Supervision/Assistance  Patient can return home with the following A lot of help with bathing/dressing/bathroom;Assistance with cooking/housework;Assist for transportation;Two people to help with walking and/or transfers;Help with stairs or ramp for entrance   Equipment Recommendations  None recommended by PT     Recommendations for Other Services       Precautions / Restrictions Precautions Precautions: Fall;Other (comment) Precaution Comments: L BKA (baseline) Restrictions Weight Bearing Restrictions: Yes LLE Weight Bearing: Weight bearing as tolerated Other Position/Activity Restrictions: L distal femur fx; per Dr [name] 8/10 pt can full WB     Mobility  Bed Mobility Overal bed mobility: Modified Independent Bed Mobility: Sit to Supine       Sit to supine: Modified independent (Device/Increase time)   General bed mobility comments: EOB to supine no assist    Transfers Overall transfer level: Needs assistance Equipment used: Sliding board Transfers: Bed to chair/wheelchair/BSC, Sit to/from Stand Sit to Stand: Min assist, Mod assist, +2 physical assistance, From elevated surface          Lateral/Scoot Transfers: +2 safety/equipment, With slide board, Min guard General transfer comment: lateral transfer going "uphill" from recliner to bed (~2 inch difference); standing from EOB with RW, pt unable to stand x 1 attempt, completed last 2 attempts, time standing <15 seconds due to rt knee pain    Ambulation/Gait                   Stairs             Wheelchair Mobility    Modified Rankin (Stroke Patients Only)       Balance Overall balance assessment: Needs assistance Sitting-balance support: Feet supported Sitting balance-Leahy Scale: Good Sitting balance - Comments: no assistance with sitting balance   Standing balance support: Bilateral upper extremity supported, During functional activity, Reliant on assistive device for balance Standing balance-Leahy Scale: Poor Standing balance comment: reliant on RW for support  Cognition Arousal/Alertness: Awake/alert Behavior During Therapy: WFL for tasks assessed/performed Overall Cognitive Status: Within Functional Limits for tasks assessed                                           Exercises Other Exercises Other Exercises: pt reports she continue to work on LLE internal rotation    General Comments        Pertinent Vitals/Pain Pain Assessment Pain Assessment: Faces Faces Pain Scale: Hurts even more Pain Location: R knee Pain Descriptors / Indicators: Discomfort Pain Intervention(s): Limited activity within patient's tolerance, Monitored during session, Premedicated before session    Home Living                          Prior Function            PT Goals (current goals can now be found in the care plan section) Acute Rehab PT Goals Patient Stated Goal: less pain with movement PT Goal Formulation: With patient Time For Goal Achievement: 11/21/21 Potential to Achieve Goals: Fair Progress towards PT goals: Progressing toward goals (goal update due today)    Frequency    Min 2X/week      PT Plan Current plan remains appropriate    Co-evaluation              AM-PAC PT "6 Clicks" Mobility   Outcome Measure  Help needed turning from your back to your side while in a flat bed without using bedrails?: None Help needed moving from lying on your back to sitting on the side of a flat bed without using bedrails?: None Help needed moving to and from a bed to a chair (including a wheelchair)?: A Little Help needed standing up from a chair using your arms (e.g., wheelchair or bedside chair)?: Total Help needed to walk in hospital room?: Total Help needed climbing 3-5 steps with a railing? : Total 6 Click Score: 14    End of Session Equipment Utilized During Treatment: Gait belt Activity Tolerance: Patient tolerated treatment well Patient left: in bed;with call bell/phone within reach   PT Visit Diagnosis: Other abnormalities of gait and mobility (R26.89);Muscle weakness (generalized) (M62.81);History of falling (Z91.81);Pain Pain - Right/Left: Left Pain - part of body: Leg     Time: 1275-1700 PT  Time Calculation (min) (ACUTE ONLY): 45 min  Charges:  $Therapeutic Activity: 38-52 mins                      Arby Barrette, PT Acute Rehabilitation Services  Office [phone]    [name] 11/07/2021, 3:19 PM

## 2021-11-07 NOTE — Plan of Care (Signed)
  Problem: Activity: Goal: Risk for activity intolerance will decrease Outcome: Progressing   

## 2021-11-08 DIAGNOSIS — M86252 Subacute osteomyelitis, left femur: Secondary | ICD-10-CM | POA: Diagnosis not present

## 2021-11-08 LAB — RENAL FUNCTION PANEL
Albumin: 2.9 g/dL — ABNORMAL LOW (ref 3.5–5.0)
Anion gap: 12 (ref 5–15)
BUN: 73 mg/dL — ABNORMAL HIGH (ref 6–20)
CO2: 27 mmol/L (ref 22–32)
Calcium: 9.2 mg/dL (ref 8.9–10.3)
Chloride: 91 mmol/L — ABNORMAL LOW (ref 98–111)
Creatinine, Ser: 5.93 mg/dL — ABNORMAL HIGH (ref 0.44–1.00)
GFR, Estimated: 8 mL/min — ABNORMAL LOW (ref >60)
Glucose, Bld: 140 mg/dL — ABNORMAL HIGH (ref 70–99)
Phosphorus: 5.3 mg/dL — ABNORMAL HIGH (ref 2.5–4.6)
Potassium: 4.9 mmol/L (ref 3.5–5.1)
Sodium: 130 mmol/L — ABNORMAL LOW (ref 135–145)

## 2021-11-08 LAB — CBC
HCT: 35 % — ABNORMAL LOW (ref 36.0–46.0)
Hemoglobin: 11.3 g/dL — ABNORMAL LOW (ref 12.0–15.0)
MCH: 28 pg (ref 26.0–34.0)
MCHC: 32.3 g/dL (ref 30.0–36.0)
MCV: 86.8 fL (ref 80.0–100.0)
Platelets: 186 10*3/uL (ref 150–400)
RBC: 4.03 MIL/uL (ref 3.87–5.11)
RDW: 15.1 % (ref 11.5–15.5)
WBC: 6.1 10*3/uL (ref 4.0–10.5)
nRBC: 0 % (ref 0.0–0.2)

## 2021-11-08 LAB — GLUCOSE, CAPILLARY
Glucose-Capillary: 112 mg/dL — ABNORMAL HIGH (ref 70–99)
Glucose-Capillary: 202 mg/dL — ABNORMAL HIGH (ref 70–99)
Glucose-Capillary: 236 mg/dL — ABNORMAL HIGH (ref 70–99)

## 2021-11-08 MED ORDER — DOCUSATE SODIUM 100 MG PO CAPS
200.0000 mg | ORAL_CAPSULE | Freq: Two times a day (BID) | ORAL | Status: DC
  Administered 2021-11-08 – 2021-11-10 (×3): 200 mg via ORAL
  Administered 2021-11-10: 100 mg via ORAL
  Administered 2021-11-11: 200 mg via ORAL
  Administered 2021-11-12: 100 mg via ORAL
  Administered 2021-11-12 – 2021-11-13 (×2): 200 mg via ORAL
  Administered 2021-11-13: 100 mg via ORAL
  Administered 2021-11-14 – 2021-11-30 (×10): 200 mg via ORAL
  Administered 2021-12-01: 100 mg via ORAL
  Administered 2021-12-05 – 2022-01-20 (×61): 200 mg via ORAL
  Administered 2022-01-21: 100 mg via ORAL
  Administered 2022-01-23 – 2022-01-27 (×4): 200 mg via ORAL
  Administered 2022-01-28: 100 mg via ORAL
  Administered 2022-01-28 – 2022-02-17 (×21): 200 mg via ORAL
  Administered 2022-02-18: 100 mg via ORAL
  Administered 2022-02-22 – 2022-03-18 (×24): 200 mg via ORAL
  Filled 2021-11-08 (×210): qty 2

## 2021-11-08 MED ORDER — LACTULOSE 10 GM/15ML PO SOLN
20.0000 g | Freq: Every day | ORAL | Status: DC
  Administered 2021-11-11: 20 g via ORAL
  Filled 2021-11-08 (×2): qty 30

## 2021-11-08 MED ORDER — DOCUSATE SODIUM 100 MG PO CAPS
100.0000 mg | ORAL_CAPSULE | Freq: Once | ORAL | Status: AC
Start: 2021-11-08 — End: 2021-11-08
  Administered 2021-11-08: 100 mg via ORAL

## 2021-11-08 MED ORDER — POLYETHYLENE GLYCOL 3350 17 G PO PACK
17.0000 g | PACK | Freq: Every day | ORAL | Status: DC
  Administered 2021-11-11 – 2022-02-13 (×6): 17 g via ORAL
  Filled 2021-11-08 (×61): qty 1

## 2021-11-08 NOTE — Progress Notes (Signed)
[facility] PROGRESS NOTE  Patient: [name]   PCP: [name], MD DOB: 06/24/1965   DOA: 08/26/2021   DOS: 11/08/2021    Subjective: Patient seen in the HD again in bed.  No nausea or vomiting.  Reports abdominal pain reports nausea.  Objective:   Vitals:   11/08/21 1130 11/08/21 1142 11/08/21 1253 11/08/21 1648  BP: 135/65 127/73 (!) 146/58 (!) 126/51  Pulse: 65 66 61 61  Resp: 13 18 17 15   Temp:  (!) 96.8 F (36 C) 98.3 F (36.8 C)   TempSrc:  Oral Oral   SpO2: 98% 100% 98% 100%  Weight:  91.5 kg    Height:        S1-S2 present. Clear to auscultation. Bowel sound present.  Tender diffusely.  Assessment and plan: Abdominal pain. Constipation. Likely from opioids. Patient refuses to take laxative but will take stool softener. Explained to her that most likely her constipation is coming from opioids and her abdominal pain is coming from her chronic constipation as well as nausea. She will benefit from therapy for the constipation. The options are to either initiate bowel regimen or to reduce narcotics and improve mobility. She told me that she will consider this options.  ESRD on HD. Still not able to perform HD in chair consistently. Will monitor.  Brief Hospital course [name] is a 56 y.o. female with a history of ESRD, anemia of CKD, diabetes mellitus type 2, hyperlipidemia, CVA, anxiety, depression, GERD, tobacco, left hip fracture s/p IM nail placement and recent hardware removal. Patient presented secondary to left hip bleeding and found to have evidence of subacute osteomyelitis and abscess of left femur. Orthopedic surgery/infectious disease consulted. Patient was started on empiric antibiotics and underwent multiple I&Ds. Patient completed a 6 week course of daptomycin. Discharge is pending ability for patient to tolerate HD in a chair which is complicated by patient's refusal/inability to sit in a chair secondary to pain. Patient  tolerated HD in a chair on 9/4.   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  Active Problems:   ESRD (end stage renal disease) on dialysis ([place])   Type 2 diabetes mellitus with hyperlipidemia (HCC)   History of CVA (cerebrovascular accident)   Essential hypertension   Anxiety and depression   GASTROESOPHAGEAL REFLUX, NO ESOPHAGITIS   TOBACCO DEPENDENCE   Hyponatremia   Obesity (BMI 30.0-34.9)   Unilateral amputation of left foot (HCC)   Wound infection   History of CVA (cerebrovascular accident) without residual deficits   Hyperkalemia   Wound dehiscence, surgical, sequela    Author: Berle Mull, MD [facility] 11/08/2021 6:36 PM   If 7PM-7AM, please contact night-coverage at [email or web address]

## 2021-11-08 NOTE — Progress Notes (Signed)
Received patient in bed to unit.  Alert and oriented.  Informed consent signed and in chart.   Treatment initiated: 0708  Treatment completed: 1142  Patient tolerated well.  Transported back to the room  Alert, without acute distress.  Hand-off given to patient's nurse.   Access used: AVF Access issues: NA  Total UF removed: 4574ml Medication(s) given: NA Post HD VS: 96.8    127/73   66    18    100% RA  Post HD weight: 91.5kg   [facility]

## 2021-11-08 NOTE — Progress Notes (Signed)
[name] Progress Note   Subjective:    Seen on HD. She reports generalized pain. Denies SOB, CP and N/V. Tolerating UFG 4.5L.   Objective Vitals:   11/08/21 0830 11/08/21 0900 11/08/21 0930 11/08/21 1000  BP: (!) 115/56 (!) 155/69 (!) 162/63 (!) 148/68  Pulse: 64 (!) 58 60 62  Resp: 10 12 (!) 8 (!) 9  Temp:      TempSrc:      SpO2: 92% 96% 98% 97%  Weight:      Height:       Physical Exam General: Well appearing woman, NAD. Room air. Heart: RRR; no murmur Lungs: CTAB; no rales Abdomen: soft Extremities: L BKA with stump edema; 1+ B thigh edema Dialysis Access: L AVF + thrill  Filed Weights   11/06/21 1146 11/06/21 1558 11/08/21 0700  Weight: 97.5 kg 95.8 kg 96.2 kg    Intake/Output Summary (Last 24 hours) at 11/08/2021 1024 Last data filed at 11/07/2021 2200 Gross per 24 hour  Intake 100 ml  Output 0 ml  Net 100 ml    Additional Objective Labs: Basic Metabolic Panel: Recent Labs  Lab 11/04/21 1437 11/06/21 1158 11/08/21 0741  NA 129* 128* 130*  K 6.1* 5.7* 4.9  CL 92* 92* 91*  CO2 25 25 27   GLUCOSE 86 154* 140*  BUN 82* 80* 73*  CREATININE 7.07* 6.86* 5.93*  CALCIUM 8.9 8.9 9.2  PHOS 5.5* 5.6* 5.3*   Liver Function Tests: Recent Labs  Lab 11/04/21 1437 11/06/21 1158 11/08/21 0741  ALBUMIN 3.0* 2.9* 2.9*   No results for input(s): "LIPASE", "AMYLASE" in the last 168 hours. CBC: Recent Labs  Lab 11/04/21 1437 11/06/21 1001 11/08/21 0740  WBC 7.3 6.1 6.1  HGB 11.5* 10.8* 11.3*  HCT 35.5* 34.1* 35.0*  MCV 87.0 87.9 86.8  PLT 213 189 186   Blood Culture    Component Value Date/Time   SDES TISSUE 08/28/2021 0917   SPECREQUEST LEFT THIGH TISSUE 08/28/2021 0917   CULT  08/28/2021 0917    MODERATE ENTEROCOCCUS FAECIUM VANCOMYCIN RESISTANT ENTEROCOCCUS ISOLATED NO ANAEROBES ISOLATED Sent to [facility] for further susceptibility testing. Performed at [facility], [place] [address] [place], [place] [address]     REPTSTATUS 09/02/2021 FINAL 08/28/2021 6045    Cardiac Enzymes: No results for input(s): "CKTOTAL", "CKMB", "CKMBINDEX", "TROPONINI" in the last 168 hours. CBG: Recent Labs  Lab 11/06/21 2121 11/07/21 0747 11/07/21 1128 11/07/21 1703 11/07/21 2156  GLUCAP 270* 154* 133* 167* 141*   Iron Studies: No results for input(s): "IRON", "TIBC", "TRANSFERRIN", "FERRITIN" in the last 72 hours. Lab Results  Component Value Date   INR 1.1 12/28/2019   INR 1.2 12/17/2018   INR 0.99 04/07/2017   Studies/Results: DG Abd Portable 1V  Result Date: 11/07/2021 CLINICAL DATA:  Abdominal discomfort EXAM: PORTABLE ABDOMEN - 1 VIEW COMPARISON:  None Available. FINDINGS: Lung bases are clear. Cholecystectomy clips. Large amount of stool throughout the colon. Paucity of small bowel gas. Pelvic calcification. IMPRESSION: Stool throughout the colon as can be seen with constipation. Electronically Signed   By: Lovey Newcomer M.D.   On: 11/07/2021 19:36    Medications:  sodium chloride     sodium chloride 10 mL/hr at 09/19/21 1310   methocarbamol (ROBAXIN) IV      (feeding supplement) PROSource Plus  30 mL Oral BID BM   alosetron  1 mg Oral BID   amitriptyline  25 mg Oral QHS   amLODipine  10 mg Oral  Q24H   atorvastatin  40 mg Oral q1800   carvedilol  6.25 mg Oral BID   cyclobenzaprine  10 mg Oral TID   docusate sodium  100 mg Oral BID   DULoxetine  60 mg Oral Daily   famotidine  20 mg Oral Daily   furosemide  80 mg Oral Daily   heparin  5,000 Units Subcutaneous Q8H   hydrALAZINE  25 mg Oral TID   insulin aspart  0-6 Units Subcutaneous TID WC   insulin aspart  3 Units Subcutaneous TID WC   insulin glargine-yfgn  5 Units Subcutaneous QHS   multivitamin  1 tablet Oral QHS   oxyCODONE  15 mg Oral Q M,W,F   pantoprazole  80 mg Oral Daily   sevelamer carbonate  2,400 mg Oral TID WC   sodium chloride flush  3 mL Intravenous Q12H   sodium zirconium cyclosilicate  10 g Oral Once per day on Sun Tue Thu Sat     Dialysis Orders: MWF at [facility], 400/500, EDW 90kg, 2K/2Ca, LUE AVF, heparin 4000 unit IV bolus - No ESA   Assessment/Plan: 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 Back to OR 08/28/21, 08/31/21 for L hip debridement and wound vac placement (now removed). Finished 6 week course of IV Daptomycin on 10/12/21. Management per primary team and ortho. ESRD: Continue HD on usual MWF. On HD. Hyperkalemia: Recurrent issues, on 2K bath + furosemide. Lokelma 10g added on non-HD days. K+ today 4.9. AVF dysfunction: S/p branch ligation in OR 09/19/2021 per Dr. Virl Cagey. Was having intermittent cannulation issues, now working well. HTN/ volume: BP controlled on amlodipine/hydralazine/Coreg. Edema still present  - reminded on fluid restrictions. Anemia of ESRD: Hgb at goal, not on ESA.  Secondary HPTH: Ca/Phos ok. Not on VDRA, PTH ordered and awaiting results. Nutrition: Alb low, continue protein supplements. DM2: Insulin per primary Dispo: SNF previously recommended, now she would like to go home on discharge. Dialyzed in recliner on 9/4 (although signed off HD early after 2.5hr) - try again when agreeable (refused on 9/6).  Tobie Poet, NP [facility] 11/08/2021,10:24 AM  LOS: 74 days

## 2021-11-08 NOTE — Progress Notes (Signed)
OT Cancellation Note  Patient Details Name: [name] MRN: [id number] DOB: 02-03-1966   Cancelled Treatment:    Reason Eval/Treat Not Completed: Patient declined, no reason specified (fatigued following HD)  [name] 11/08/2021, 3:59 PM Cleta Alberts, OTR/L Acute Rehabilitation Services Office: [phone]

## 2021-11-09 DIAGNOSIS — M86252 Subacute osteomyelitis, left femur: Secondary | ICD-10-CM | POA: Diagnosis not present

## 2021-11-09 LAB — GLUCOSE, CAPILLARY
Glucose-Capillary: 165 mg/dL — ABNORMAL HIGH (ref 70–99)
Glucose-Capillary: 167 mg/dL — ABNORMAL HIGH (ref 70–99)
Glucose-Capillary: 164 mg/dL — ABNORMAL HIGH (ref 70–99)
Glucose-Capillary: 167 mg/dL — ABNORMAL HIGH (ref 70–99)

## 2021-11-09 LAB — PARATHYROID HORMONE, INTACT (NO CA): PTH: 120 pg/mL — ABNORMAL HIGH (ref 15–65)

## 2021-11-09 NOTE — Progress Notes (Signed)
[facility] PROGRESS NOTE  Patient: [name] BCW:[id number]   PCP: [name], MD DOB: December 29, 1965   DOA: 08/26/2021   DOS: 11/09/2021    Subjective: Reportedly constipation resolved.  Abdominal pain improving.  No nausea no vomiting no fever no chills.  Refusing stool softeners.  As she reports she has bowel movements.  Objective:   Vitals:   11/08/21 2211 11/08/21 2300 11/09/21 0511 11/09/21 0914  BP: (!) 136/49  (!) 140/56 (!) 154/60  Pulse: 64  62 61  Resp: 18  17 14   Temp: 97.7 F (36.5 C)  97.9 F (36.6 C) 98.4 F (36.9 C)  TempSrc: Oral   Oral  SpO2: 98%  99% 99%  Weight:  95.1 kg    Height:        S1-S2 present. Bowel sound present Nontender.  Assessment and plan: Active Issues Abdominal pain, severe constipation. Follow-up on x-ray tomorrow. Continue stool softener. Minimize narcotics.  ESRD on HD. Encourage patient to perform HD in chair.  [facility] Hospital course MARVETTA VOHS is a 56 y.o. female with a history of ESRD, anemia of CKD, diabetes mellitus type 2, hyperlipidemia, CVA, anxiety, depression, GERD, tobacco, left hip fracture s/p IM nail placement and recent hardware removal. Patient presented secondary to left hip bleeding and found to have evidence of subacute osteomyelitis and abscess of left femur. Orthopedic surgery/infectious disease consulted. Patient was started on empiric antibiotics and underwent multiple I&Ds. Patient completed a 6 week course of daptomycin. Discharge is pending ability for patient to tolerate HD in a chair which is complicated by patient's refusal/inability to sit in a chair secondary to pain. Patient tolerated HD in a chair on 9/4.   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  Active Problems:   ESRD (end stage renal disease) on dialysis ([place])   Type 2 diabetes mellitus with hyperlipidemia (HCC)   History of CVA (cerebrovascular accident)   Essential hypertension   Anxiety and  depression   GASTROESOPHAGEAL REFLUX, NO ESOPHAGITIS   TOBACCO DEPENDENCE   Hyponatremia   Obesity (BMI 30.0-34.9)   Unilateral amputation of left foot (HCC)   Wound infection   History of CVA (cerebrovascular accident) without residual deficits   Hyperkalemia   Wound dehiscence, surgical, sequela    Author: Berle Mull, MD [facility] 11/09/2021 4:55 PM   If 7PM-7AM, please contact night-coverage at [email or web address]

## 2021-11-09 NOTE — Plan of Care (Signed)
  Problem: Activity: Goal: Risk for activity intolerance will decrease Outcome: Progressing   Problem: Pain Managment: Goal: General experience of comfort will improve Outcome: Progressing   

## 2021-11-09 NOTE — Progress Notes (Signed)
[name] Progress Note   Subjective:    Seen and examined at bedside. No acute issues. Tolerated yesterday's HD with net UF 4.5L.   Objective Vitals:   11/08/21 2211 11/08/21 2300 11/09/21 0511 11/09/21 0914  BP: (!) 136/49  (!) 140/56 (!) 154/60  Pulse: 64  62 61  Resp: 18  17 14   Temp: 97.7 F (36.5 C)  97.9 F (36.6 C) 98.4 F (36.9 C)  TempSrc: Oral   Oral  SpO2: 98%  99% 99%  Weight:  95.1 kg    Height:       Physical Exam General: Well appearing woman, NAD. Room air. Heart: RRR; no murmur Lungs: CTAB; no rales or wheezing Abdomen: soft and non-tender Extremities: L BKA with stump edema; 1+ B thigh edema Dialysis Access: L AVF + thrill  Filed Weights   11/08/21 0700 11/08/21 1142 11/08/21 2300  Weight: 96.2 kg 91.5 kg 95.1 kg    Intake/Output Summary (Last 24 hours) at 11/09/2021 1346 Last data filed at 11/09/2021 1322 Gross per 24 hour  Intake 820 ml  Output 300 ml  Net 520 ml    Additional Objective Labs: Basic Metabolic Panel: Recent Labs  Lab 11/04/21 1437 11/06/21 1158 11/08/21 0741  NA 129* 128* 130*  K 6.1* 5.7* 4.9  CL 92* 92* 91*  CO2 25 25 27   GLUCOSE 86 154* 140*  BUN 82* 80* 73*  CREATININE 7.07* 6.86* 5.93*  CALCIUM 8.9 8.9 9.2  PHOS 5.5* 5.6* 5.3*   Liver Function Tests: Recent Labs  Lab 11/04/21 1437 11/06/21 1158 11/08/21 0741  ALBUMIN 3.0* 2.9* 2.9*   No results for input(s): "LIPASE", "AMYLASE" in the last 168 hours. CBC: Recent Labs  Lab 11/04/21 1437 11/06/21 1001 11/08/21 0740  WBC 7.3 6.1 6.1  HGB 11.5* 10.8* 11.3*  HCT 35.5* 34.1* 35.0*  MCV 87.0 87.9 86.8  PLT 213 189 186   Blood Culture    Component Value Date/Time   SDES TISSUE 08/28/2021 0917   SPECREQUEST LEFT THIGH TISSUE 08/28/2021 0917   CULT  08/28/2021 0917    MODERATE ENTEROCOCCUS FAECIUM VANCOMYCIN RESISTANT ENTEROCOCCUS ISOLATED NO ANAEROBES ISOLATED Sent to [facility] for further susceptibility testing. Performed at [facility], [place] [address], [place], [place] [address]    REPTSTATUS 09/02/2021 FINAL 08/28/2021 1497    Cardiac Enzymes: No results for input(s): "CKTOTAL", "CKMB", "CKMBINDEX", "TROPONINI" in the last 168 hours. CBG: Recent Labs  Lab 11/08/21 1252 11/08/21 1648 11/08/21 2208 11/09/21 0719 11/09/21 1140  GLUCAP 112* 202* 236* 165* 167*   Iron Studies: No results for input(s): "IRON", "TIBC", "TRANSFERRIN", "FERRITIN" in the last 72 hours. Lab Results  Component Value Date   INR 1.1 12/28/2019   INR 1.2 12/17/2018   INR 0.99 04/07/2017   Studies/Results: DG Abd Portable 1V  Result Date: 11/07/2021 CLINICAL DATA:  Abdominal discomfort EXAM: PORTABLE ABDOMEN - 1 VIEW COMPARISON:  None Available. FINDINGS: Lung bases are clear. Cholecystectomy clips. Large amount of stool throughout the colon. Paucity of small bowel gas. Pelvic calcification. IMPRESSION: Stool throughout the colon as can be seen with constipation. Electronically Signed   By: Lovey Newcomer M.D.   On: 11/07/2021 19:36    Medications:  sodium chloride     sodium chloride 10 mL/hr at 09/19/21 1310   methocarbamol (ROBAXIN) IV      (feeding supplement) PROSource Plus  30 mL Oral BID BM   alosetron  1 mg Oral BID   amitriptyline  25  mg Oral QHS   amLODipine  10 mg Oral Q24H   atorvastatin  40 mg Oral q1800   carvedilol  6.25 mg Oral BID   cyclobenzaprine  10 mg Oral TID   docusate sodium  200 mg Oral BID   DULoxetine  60 mg Oral Daily   famotidine  20 mg Oral Daily   furosemide  80 mg Oral Daily   heparin  5,000 Units Subcutaneous Q8H   hydrALAZINE  25 mg Oral TID   insulin aspart  0-6 Units Subcutaneous TID WC   insulin aspart  3 Units Subcutaneous TID WC   insulin glargine-yfgn  5 Units Subcutaneous QHS   lactulose  20 g Oral Daily   multivitamin  1 tablet Oral QHS   pantoprazole  80 mg Oral Daily   polyethylene glycol  17 g Oral Daily   sevelamer carbonate  2,400 mg Oral TID WC   sodium chloride flush   3 mL Intravenous Q12H   sodium zirconium cyclosilicate  10 g Oral Once per day on Sun Tue Thu Sat    Dialysis Orders: MWF at [facility], 400/500, EDW 90kg, 2K/2Ca, LUE AVF, heparin 4000 unit IV bolus - No ESA  Assessment/Plan: 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 Back to OR 08/28/21, 08/31/21 for L hip debridement and wound vac placement (now removed). Finished 6 week course of IV Daptomycin on 10/12/21. Management per primary team and ortho. ESRD: Continue HD on usual MWF. On HD. Hyperkalemia: Recurrent issues, on 2K bath + furosemide. Lokelma 10g added on non-HD days. Last K+ 4.9. AVF dysfunction: S/p branch ligation in OR 09/19/2021 per Dr. Virl Cagey. Was having intermittent cannulation issues, now working well. HTN/ volume: BP controlled on amlodipine/hydralazine/Coreg. Edema still present  - reminded on fluid restrictions. Anemia of ESRD: Hgb at goal, not on ESA.  Secondary HPTH: Ca/Phos ok. Not on VDRA, PTH ordered and awaiting results. Nutrition: Alb low, continue protein supplements. DM2: Insulin per primary Dispo: SNF previously recommended, now she would like to go home on discharge. Dialyzed in recliner on 9/4 (although signed off HD early after 2.5hr) - try again when agreeable (refused on 9/6).  Donna Poet, NP [name] 11/09/2021,1:46 PM  LOS: 75 days

## 2021-11-10 ENCOUNTER — Inpatient Hospital Stay (HOSPITAL_COMMUNITY): Payer: Medicare Other

## 2021-11-10 DIAGNOSIS — M86252 Subacute osteomyelitis, left femur: Secondary | ICD-10-CM | POA: Diagnosis not present

## 2021-11-10 LAB — RENAL FUNCTION PANEL
Albumin: 3 g/dL — ABNORMAL LOW (ref 3.5–5.0)
Anion gap: 9 (ref 5–15)
BUN: 71 mg/dL — ABNORMAL HIGH (ref 6–20)
CO2: 29 mmol/L (ref 22–32)
Calcium: 9.1 mg/dL (ref 8.9–10.3)
Chloride: 93 mmol/L — ABNORMAL LOW (ref 98–111)
Creatinine, Ser: 5.55 mg/dL — ABNORMAL HIGH (ref 0.44–1.00)
GFR, Estimated: 9 mL/min — ABNORMAL LOW (ref >60)
Glucose, Bld: 150 mg/dL — ABNORMAL HIGH (ref 70–99)
Phosphorus: 5.3 mg/dL — ABNORMAL HIGH (ref 2.5–4.6)
Potassium: 4.8 mmol/L (ref 3.5–5.1)
Sodium: 131 mmol/L — ABNORMAL LOW (ref 135–145)

## 2021-11-10 LAB — GLUCOSE, CAPILLARY
Glucose-Capillary: 189 mg/dL — ABNORMAL HIGH (ref 70–99)
Glucose-Capillary: 151 mg/dL — ABNORMAL HIGH (ref 70–99)
Glucose-Capillary: 144 mg/dL — ABNORMAL HIGH (ref 70–99)
Glucose-Capillary: 217 mg/dL — ABNORMAL HIGH (ref 70–99)

## 2021-11-10 MED ORDER — HEPARIN SODIUM (PORCINE) 1000 UNIT/ML DIALYSIS
1000.0000 [IU] | INTRAMUSCULAR | Status: DC | PRN
Start: 2021-11-10 — End: 2021-11-11

## 2021-11-10 MED ORDER — HEPARIN SODIUM (PORCINE) 1000 UNIT/ML DIALYSIS
20.0000 [IU]/kg | INTRAMUSCULAR | Status: DC | PRN
  Administered 2021-11-11: 1900 [IU] via INTRAVENOUS_CENTRAL
  Filled 2021-11-10: qty 2

## 2021-11-10 MED ORDER — PENTAFLUOROPROP-TETRAFLUOROETH EX AERO
1.0000 | INHALATION_SPRAY | CUTANEOUS | Status: DC | PRN
Start: 2021-11-10 — End: 2021-11-11

## 2021-11-10 MED ORDER — ALTEPLASE 2 MG IJ SOLR
2.0000 mg | Freq: Once | INTRAMUSCULAR | Status: DC | PRN
Start: 2021-11-10 — End: 2021-11-11

## 2021-11-10 MED ORDER — CHLORHEXIDINE GLUCONATE CLOTH 2 % EX PADS: 6.0000 | MEDICATED_PAD | Freq: Every day | CUTANEOUS | Status: DC

## 2021-11-10 NOTE — Progress Notes (Addendum)
[facility] Progress Note   Subjective:    Seen and examined patient at bedside. No acute complaints. Next HD 9/11.  Objective Vitals:   11/09/21 1655 11/09/21 2125 11/10/21 0536 11/10/21 0930  BP: 136/63 (!) 123/51 (!) 158/53 (!) 155/95  Pulse: 60 61 65 68  Resp: 18 17 18 19   Temp: 98.2 F (36.8 C) 98.4 F (36.9 C) 98.1 F (36.7 C) 98.3 F (36.8 C)  TempSrc: Oral Oral    SpO2: 98% 98% 100% 98%  Weight:  97.4 kg    Height:       Physical Exam General: Well appearing woman, NAD. Room air. Heart: RRR; no murmur Lungs: CTAB; no rales or wheezing Abdomen: soft and non-tender Extremities: L BKA with stump edema; 1+ B thigh edema Dialysis Access: L AVF + thrill  Filed Weights   11/08/21 1142 11/08/21 2300 11/09/21 2125  Weight: 91.5 kg 95.1 kg 97.4 kg    Intake/Output Summary (Last 24 hours) at 11/10/2021 1121 Last data filed at 11/10/2021 1007 Gross per 24 hour  Intake 1440 ml  Output 0 ml  Net 1440 ml    Additional Objective Labs: Basic Metabolic Panel: Recent Labs  Lab 11/06/21 1158 11/08/21 0741 11/10/21 0303  NA 128* 130* 131*  K 5.7* 4.9 4.8  CL 92* 91* 93*  CO2 25 27 29   GLUCOSE 154* 140* 150*  BUN 80* 73* 71*  CREATININE 6.86* 5.93* 5.55*  CALCIUM 8.9 9.2 9.1  PHOS 5.6* 5.3* 5.3*   Liver Function Tests: Recent Labs  Lab 11/06/21 1158 11/08/21 0741 11/10/21 0303  ALBUMIN 2.9* 2.9* 3.0*   No results for input(s): "LIPASE", "AMYLASE" in the last 168 hours. CBC: Recent Labs  Lab 11/04/21 1437 11/06/21 1001 11/08/21 0740  WBC 7.3 6.1 6.1  HGB 11.5* 10.8* 11.3*  HCT 35.5* 34.1* 35.0*  MCV 87.0 87.9 86.8  PLT 213 189 186   Blood Culture    Component Value Date/Time   SDES TISSUE 08/28/2021 0917   SPECREQUEST LEFT THIGH TISSUE 08/28/2021 0917   CULT  08/28/2021 0917    MODERATE ENTEROCOCCUS FAECIUM VANCOMYCIN RESISTANT ENTEROCOCCUS ISOLATED NO ANAEROBES ISOLATED Sent to [place] [facility] for further susceptibility  testing. Performed at [facility], [place] [address] [place], [place] [address]    REPTSTATUS 09/02/2021 FINAL 08/28/2021 3299    Cardiac Enzymes: No results for input(s): "CKTOTAL", "CKMB", "CKMBINDEX", "TROPONINI" in the last 168 hours. CBG: Recent Labs  Lab 11/09/21 0719 11/09/21 1140 11/09/21 1657 11/09/21 2123 11/10/21 0720  GLUCAP 165* 167* 164* 167* 189*   Iron Studies: No results for input(s): "IRON", "TIBC", "TRANSFERRIN", "FERRITIN" in the last 72 hours. Lab Results  Component Value Date   INR 1.1 12/28/2019   INR 1.2 12/17/2018   INR 0.99 04/07/2017   Studies/Results: No results found.  Medications:  sodium chloride     sodium chloride 10 mL/hr at 09/19/21 1310   methocarbamol (ROBAXIN) IV      (feeding supplement) PROSource Plus  30 mL Oral BID BM   alosetron  1 mg Oral BID   amitriptyline  25 mg Oral QHS   amLODipine  10 mg Oral Q24H   atorvastatin  40 mg Oral q1800   carvedilol  6.25 mg Oral BID   cyclobenzaprine  10 mg Oral TID   docusate sodium  200 mg Oral BID   DULoxetine  60 mg Oral Daily   famotidine  20 mg Oral Daily   furosemide  80 mg Oral Daily  heparin  5,000 Units Subcutaneous Q8H   hydrALAZINE  25 mg Oral TID   insulin aspart  0-6 Units Subcutaneous TID WC   insulin aspart  3 Units Subcutaneous TID WC   insulin glargine-yfgn  5 Units Subcutaneous QHS   lactulose  20 g Oral Daily   multivitamin  1 tablet Oral QHS   pantoprazole  80 mg Oral Daily   polyethylene glycol  17 g Oral Daily   sevelamer carbonate  2,400 mg Oral TID WC   sodium chloride flush  3 mL Intravenous Q12H   sodium zirconium cyclosilicate  10 g Oral Once per day on Sun Tue Thu Sat    Dialysis Orders: MWF at [facility], 400/500, EDW 90kg, 2K/2Ca, LUE AVF, heparin 4000 unit IV bolus - No ESA  Assessment/Plan: 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 Back to OR 08/28/21, 08/31/21 for L hip debridement and wound  vac placement (now removed). Finished 6 week course of IV Daptomycin on 10/12/21. Management per primary team and ortho. ESRD: Continue HD on usual MWF. Next HD 11/11/21. Hyperkalemia: Recurrent issues, on 2K bath + furosemide. Lokelma 10g added on non-HD days. Last K+ 4.9. AVF dysfunction: S/p branch ligation in OR 09/19/2021 per Dr. Virl Cagey. Was having intermittent cannulation issues, now working well. HTN/ volume: BP controlled on amlodipine/hydralazine/Coreg. Edema still present  - reminded on fluid restrictions. Anemia of ESRD: Hgb at goal, not on ESA.  Secondary HPTH: PTH, Ca/Phos ok. Not on VDRA. Nutrition: Alb low, continue protein supplements. DM2: Insulin per primary Dispo: SNF previously recommended, now she would like to go home on discharge. Dialyzed in recliner on 9/4 (although signed off HD early after 2.5hr) - try again when agreeable (refused on 9/6).  Tobie Poet, NP [facility] 11/10/2021,11:21 AM  LOS: 76 days

## 2021-11-10 NOTE — Progress Notes (Signed)
[facility] PROGRESS NOTE  Patient: [name] EUM:[id number]   PCP: [name], MD DOB: 05-25-1965   DOA: 08/26/2021   DOS: 11/10/2021    Subjective: Patient reports abdominal pain.  Patient reports nausea as well.  Patient continues to report pain in her left hip. On further questioning patient continues to refuse taking ' laxatives'.  Patient tells me that she has a history of IBS and has history of colostomy and therefore does not want to take ' laxatives'.  Patient is willing to take 'stool softeners'.   Objective:   Vitals:   11/09/21 1655 11/09/21 2125 11/10/21 0536 11/10/21 0930  BP: 136/63 (!) 123/51 (!) 158/53 (!) 155/95  Pulse: 60 61 65 68  Resp: 18 17 18 19   Temp: 98.2 F (36.8 C) 98.4 F (36.9 C) 98.1 F (36.7 C) 98.3 F (36.8 C)  TempSrc: Oral Oral    SpO2: 98% 98% 100% 98%  Weight:  97.4 kg    Height:        Clear to auscultation. Bowel sound present.  Abdomen soft. S1-S2 present.  Assessment and plan: Active Issues Severe constipation associated with nausea and vomiting and abdominal pain. Repeat x-ray on 9/10 continues to show severe constipation. Recommended patient to consider medication that is currently ordered. Explained the mechanism of action of all the medication. Patient unable to explain what the patient means when she says 'she will not take laxatives.' Recommended that she takes at least MiraLAX. Patient was explained with regards to risk of constipation in the setting of worsening abdominal pain and nausea.  Interview ended with patient mentioning "I am done talking for today."  [facility] [name] is a 56 y.o. female with a history of ESRD, anemia of CKD, diabetes mellitus type 2, hyperlipidemia, CVA, anxiety, depression, GERD, tobacco, left hip fracture s/p IM nail placement and recent hardware removal. Patient presented secondary to left hip bleeding and found to have evidence of subacute osteomyelitis and abscess  of left femur. Orthopedic surgery/infectious disease consulted. Patient was started on empiric antibiotics and underwent multiple I&Ds. Patient completed a 6 week course of daptomycin. Discharge is pending ability for patient to tolerate HD in a chair which is complicated by patient's refusal/inability to sit in a chair secondary to pain. Patient tolerated HD in a chair on 9/4.   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  Active Problems:   ESRD (end stage renal disease) on dialysis ([place])   Type 2 diabetes mellitus with hyperlipidemia (HCC)   History of CVA (cerebrovascular accident)   Essential hypertension   Anxiety and depression   GASTROESOPHAGEAL REFLUX, NO ESOPHAGITIS   TOBACCO DEPENDENCE   Hyponatremia   Obesity (BMI 30.0-34.9)   Unilateral amputation of left foot (HCC)   Wound infection   History of CVA (cerebrovascular accident) without residual deficits   Hyperkalemia   Wound dehiscence, surgical, sequela  Author: Berle Mull, MD [facility] 11/10/2021 3:41 PM   If 7PM-7AM, please contact night-coverage at [email or web address]

## 2021-11-10 NOTE — Plan of Care (Signed)
  Problem: Pain Managment: Goal: General experience of comfort will improve Outcome: Progressing   

## 2021-11-11 DIAGNOSIS — M86252 Subacute osteomyelitis, left femur: Secondary | ICD-10-CM | POA: Diagnosis not present

## 2021-11-11 LAB — CBC
HCT: 34 % — ABNORMAL LOW (ref 36.0–46.0)
Hemoglobin: 11 g/dL — ABNORMAL LOW (ref 12.0–15.0)
MCH: 27.9 pg (ref 26.0–34.0)
MCHC: 32.4 g/dL (ref 30.0–36.0)
MCV: 86.3 fL (ref 80.0–100.0)
Platelets: 201 10*3/uL (ref 150–400)
RBC: 3.94 MIL/uL (ref 3.87–5.11)
RDW: 15.3 % (ref 11.5–15.5)
WBC: 6.7 10*3/uL (ref 4.0–10.5)
nRBC: 0 % (ref 0.0–0.2)

## 2021-11-11 LAB — GLUCOSE, CAPILLARY
Glucose-Capillary: 152 mg/dL — ABNORMAL HIGH (ref 70–99)
Glucose-Capillary: 133 mg/dL — ABNORMAL HIGH (ref 70–99)
Glucose-Capillary: 149 mg/dL — ABNORMAL HIGH (ref 70–99)
Glucose-Capillary: 222 mg/dL — ABNORMAL HIGH (ref 70–99)

## 2021-11-11 LAB — RENAL FUNCTION PANEL
Albumin: 3 g/dL — ABNORMAL LOW (ref 3.5–5.0)
Anion gap: 11 (ref 5–15)
BUN: 98 mg/dL — ABNORMAL HIGH (ref 6–20)
CO2: 26 mmol/L (ref 22–32)
Calcium: 8.8 mg/dL — ABNORMAL LOW (ref 8.9–10.3)
Chloride: 91 mmol/L — ABNORMAL LOW (ref 98–111)
Creatinine, Ser: 6.9 mg/dL — ABNORMAL HIGH (ref 0.44–1.00)
GFR, Estimated: 7 mL/min — ABNORMAL LOW (ref >60)
Glucose, Bld: 159 mg/dL — ABNORMAL HIGH (ref 70–99)
Phosphorus: 5.6 mg/dL — ABNORMAL HIGH (ref 2.5–4.6)
Potassium: 5.2 mmol/L — ABNORMAL HIGH (ref 3.5–5.1)
Sodium: 128 mmol/L — ABNORMAL LOW (ref 135–145)

## 2021-11-11 MED ORDER — PENTAFLUOROPROP-TETRAFLUOROETH EX AERO
INHALATION_SPRAY | CUTANEOUS | Status: AC
  Filled 2021-11-11: qty 30

## 2021-11-11 NOTE — Progress Notes (Signed)
Received patient in bed to unit.  Alert and oriented.  Informed consent signed and in chart.   Treatment initiated: 1326 Treatment completed: 1740  Patient tolerated well.  Transported back to the room  Alert, without acute distress.  Hand-off given to patient's nurse.   Access used: AVF  Access issues: NA  Total UF removed: 4523ml Medication(s) given: Heparin 1900 units bolus, Zofran 4mg  IVP Post HD VS: 98.6    123/63    66    12    96% RA Post HD weight: 92.9kg   [facility]

## 2021-11-11 NOTE — Progress Notes (Signed)
PT Cancellation Note  Patient Details Name: [name] MRN: [id number] DOB: December 14, 1965   Cancelled Treatment:    Reason Eval/Treat Not Completed: Patient at procedure or test/unavailable   Attempted to see this a.m. prior to leaving for hemodialysis, however pt first needed pain medication and then refused a.m. session. She stated she wanted to be seen after dialysis. Currently pt still in dialysis. OT to attempt to see 9/12.    [place]  Office [phone]   [name] 11/11/2021, 3:32 PM

## 2021-11-11 NOTE — Plan of Care (Signed)
  Problem: Nutritional: Goal: Ability to make healthy dietary choices will improve Outcome: Progressing   Problem: Activity: Goal: Risk for activity intolerance will decrease Outcome: Not Progressing   Problem: Pain Managment: Goal: General experience of comfort will improve Outcome: Not Progressing

## 2021-11-11 NOTE — Progress Notes (Signed)
[facility] Progress Note   Subjective:  Seen in room. L stump pain last night, sore this AM. No CP or dyspnea. For HD later today.  Objective Vitals:   11/10/21 0930 11/10/21 1743 11/10/21 2108 11/11/21 0537  BP: (!) 155/95 (!) 141/56 130/63 (!) 158/61  Pulse: 68 61 61 63  Resp: 19 19 18 17   Temp: 98.3 F (36.8 C) (!) 97.5 F (36.4 C) 98.2 F (36.8 C) 98 F (36.7 C)  TempSrc:   Oral Oral  SpO2: 98% 96% 97% 99%  Weight:      Height:       Physical Exam General: Well appearing woman, NAD. Room air. Heart: RRR; no murmur Lungs: CTAB; no rales or wheezing Abdomen: soft and non-tender Extremities: L BKA with stump edema; 1+ B thigh edema Dialysis Access: L AVF + thrill    Additional Objective Labs: Basic Metabolic Panel: Recent Labs  Lab 11/06/21 1158 11/08/21 0741 11/10/21 0303  NA 128* 130* 131*  K 5.7* 4.9 4.8  CL 92* 91* 93*  CO2 25 27 29   GLUCOSE 154* 140* 150*  BUN 80* 73* 71*  CREATININE 6.86* 5.93* 5.55*  CALCIUM 8.9 9.2 9.1  PHOS 5.6* 5.3* 5.3*   Liver Function Tests: Recent Labs  Lab 11/06/21 1158 11/08/21 0741 11/10/21 0303  ALBUMIN 2.9* 2.9* 3.0*   CBC: Recent Labs  Lab 11/04/21 1437 11/06/21 1001 11/08/21 0740  WBC 7.3 6.1 6.1  HGB 11.5* 10.8* 11.3*  HCT 35.5* 34.1* 35.0*  MCV 87.0 87.9 86.8  PLT 213 189 186   Studies/Results: DG Abd Portable 1V  Result Date: 11/10/2021 CLINICAL DATA:  Abdominal pain. Wound infection of the left hip. EXAM: PORTABLE ABDOMEN - 1 VIEW COMPARISON:  One-view abdomen 11/07/2021 FINDINGS: The bowel gas pattern is normal. No radio-opaque calculi or other significant radiographic abnormality are seen. Surgical clips are present at the gallbladder fossa. Comminuted left femoral neck fracture again noted. IMPRESSION: 1. No acute abnormality of the abdomen. 2. Stable appearance of left femoral neck fracture. Electronically Signed   By: San Morelle M.D.   On: 11/10/2021 11:25     Medications:  sodium chloride     sodium chloride 10 mL/hr at 09/19/21 1310   methocarbamol (ROBAXIN) IV      (feeding supplement) PROSource Plus  30 mL Oral BID BM   alosetron  1 mg Oral BID   amitriptyline  25 mg Oral QHS   amLODipine  10 mg Oral Q24H   atorvastatin  40 mg Oral q1800   carvedilol  6.25 mg Oral BID   cyclobenzaprine  10 mg Oral TID   docusate sodium  200 mg Oral BID   DULoxetine  60 mg Oral Daily   famotidine  20 mg Oral Daily   furosemide  80 mg Oral Daily   heparin  5,000 Units Subcutaneous Q8H   hydrALAZINE  25 mg Oral TID   insulin aspart  0-6 Units Subcutaneous TID WC   insulin aspart  3 Units Subcutaneous TID WC   insulin glargine-yfgn  5 Units Subcutaneous QHS   lactulose  20 g Oral Daily   multivitamin  1 tablet Oral QHS   pantoprazole  80 mg Oral Daily   polyethylene glycol  17 g Oral Daily   sevelamer carbonate  2,400 mg Oral TID WC   sodium chloride flush  3 mL Intravenous Q12H   sodium zirconium cyclosilicate  10 g Oral Once per day on Sun Tue Thu Sat  Dialysis Orders: MWF at [facility], 400/500, EDW 90kg, 2K/2Ca, LUE AVF, heparin 4000 unit IV bolus - No ESA   Assessment/Plan: Recurrent L hip osteomyelitis: S/p 08/10/21 surgery (removal hip IMN which was placed 12/2019) = persistent VRE infection. Back to OR 08/28/21, 08/31/21 for L hip debridement and wound vac placement (now removed). Finished 6 week course of IV Daptomycin on 10/12/21. Management per primary team. ?Does ortho have any ideas to stabilize hip to help with pain. ESRD: Continue HD on usual MWF - HD later today. Hyperkalemia: Recurrent issues, on 2K bath + furosemide. Lokelma 10g added on non-HD days, follow. AVF dysfunction: S/p branch ligation in OR 09/19/2021 per Dr. Virl Cagey. Was having intermittent cannulation issues, now working well. HTN/ volume: BP controlled on amlodipine/hydralazine/Coreg. Edema still present  - reminded on fluid restrictions (exceeds). Getting 4.5L  UFG q HD. Anemia of ESRD: Hgb at goal, not on ESA.  Secondary HPTH: PTH, Ca/Phos ok. Not on VDRA. Nutrition: Alb low, continue protein supplements. DM2: Insulin per primary Dispo: SNF previously recommended, now she would like to go home on discharge. Dialyzed in recliner on 9/4 (although signed off HD early after 2.5hr) - try again when agreeable (refused on 9/6, 9/8).  Veneta Penton, PA-C 11/11/2021, 9:35 AM  [employer]

## 2021-11-11 NOTE — Progress Notes (Signed)
[facility] PROGRESS NOTE  Patient: [name] FWY:[id number]   PCP: Donna Mariscal, MD DOB: November 03, 1965   DOA: 08/26/2021   DOS: 11/11/2021    Subjective: Unwilling to get HD in chair.  Unwilling to take stool softeners.  Objective:   Vitals:   11/11/21 1630 11/11/21 1740 11/11/21 1759 11/11/21 1824  BP: 126/61 123/63  (!) 133/58  Pulse: 62 65  65  Resp: 15 12  18   Temp:  98.6 F (37 C)  98.1 F (36.7 C)  TempSrc:  Oral  Oral  SpO2: 97% 95%  98%  Weight:   92.9 kg   Height:        Clear to auscultation. Bowel sound present. S1-S2 present.  Assessment and plan: Active Issues Abdominal pain. Currently still has symptoms of both abdominal pain as well as nausea. Tells me that she still has BM. Continues to refuse medications. We will discuss with regarding medical management tomorrow about this.  ESRD on HD. Patient consistently refusing to perform HD in chair for seemingly unclear reason other than her consistent complaint of generalized soreness. We will monitor nephrology recommendation.  Brief Hospital course [name] is a 56 y.o. female with a history of ESRD, anemia of CKD, diabetes mellitus type 2, hyperlipidemia, CVA, anxiety, depression, GERD, tobacco, left hip fracture s/p IM nail placement and recent hardware removal. Patient presented secondary to left hip bleeding and found to have evidence of subacute osteomyelitis and abscess of left femur. Orthopedic surgery/infectious disease consulted. Patient was started on empiric antibiotics and underwent multiple I&Ds. Patient completed a 6 week course of daptomycin. Discharge is pending ability for patient to tolerate HD in a chair which is complicated by patient's refusal/inability to sit in a chair secondary to pain. Patient tolerated HD in a chair on 9/4.   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  Active Problems:   ESRD (end stage renal disease) on dialysis ([facility])   Type 2  diabetes mellitus with hyperlipidemia (HCC)   History of CVA (cerebrovascular accident)   Essential hypertension   Anxiety and depression   GASTROESOPHAGEAL REFLUX, NO ESOPHAGITIS   TOBACCO DEPENDENCE   Hyponatremia   Obesity (BMI 30.0-34.9)   Unilateral amputation of left foot (HCC)   Wound infection   History of CVA (cerebrovascular accident) without residual deficits   Hyperkalemia   Wound dehiscence, surgical, sequela    Author: Berle Mull, MD [facility] 11/11/2021 7:02 PM   If 7PM-7AM, please contact night-coverage at [email or web address]

## 2021-11-12 DIAGNOSIS — M86252 Subacute osteomyelitis, left femur: Secondary | ICD-10-CM | POA: Diagnosis not present

## 2021-11-12 LAB — GLUCOSE, CAPILLARY
Glucose-Capillary: 189 mg/dL — ABNORMAL HIGH (ref 70–99)
Glucose-Capillary: 116 mg/dL — ABNORMAL HIGH (ref 70–99)
Glucose-Capillary: 155 mg/dL — ABNORMAL HIGH (ref 70–99)

## 2021-11-12 NOTE — Progress Notes (Signed)
Occupational Therapy Treatment Patient Details Name: [name] MRN: [id number] DOB: Jan 19, 1966 Today's Date: 11/12/2021   History of present illness 56 y.o. female presented to ED 6/26 from dialysis with increased bloody drainage from L hip wound. 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 s/p hardware removal of left hip with partial resection of tuft tissue and femure that were nonviable on 08/10/21 Discharged home 6/23. underwent L hip debridement and wound vac placement 6/28; +Left intertrochanteric non-union,  Fracture of left distal femur  PMH: hypertension, hyperlipidemia, ESRD on HD MWF, history of left BKA in 2021, depression/anxiety, stroke, tobacco use, T2DM,  insomnia, chronic pain syndrome,   OT comments  Patient received in supine and agreeable to OT session. Patient was able to get to EOB and was min assist for slide board setup and to lateral scoot transfer to recliner. Patient was taken to bathroom with recliner and performed hair washing and UB bathing seated in recliner using water from shower.  Patient completed grooming seated in recliner. Patient remained in recliner for lunch. Acute OT to continue to follow.    Recommendations for follow up therapy are one component of a multi-disciplinary discharge planning process, led by the attending physician.  Recommendations may be updated based on patient status, additional functional criteria and insurance authorization.    Follow Up Recommendations  Skilled nursing-short term rehab (<3 hours/day)    Assistance Recommended at Discharge Intermittent Supervision/Assistance  Patient can return home with the following  A lot of help with bathing/dressing/bathroom;Assistance with cooking/housework;Assist for transportation;Help with stairs or ramp for entrance;Two people to help with walking and/or transfers   Equipment Recommendations  Other (comment) (TBD)    Recommendations for Other Services       Precautions / Restrictions Precautions Precautions: Fall;Other (comment) Precaution Comments: L BKA (baseline) Restrictions Weight Bearing Restrictions: Yes LLE Weight Bearing: Weight bearing as tolerated Other Position/Activity Restrictions: L distal femur fx; per Dr [name] 8/10 pt can full WB       Mobility Bed Mobility Overal bed mobility: Modified Independent Bed Mobility: Sit to Supine     Supine to sit: Modified independent (Device/Increase time)     General bed mobility comments: able to get to EOB without assistance    Transfers Overall transfer level: Needs assistance Equipment used: Sliding board Transfers: Bed to chair/wheelchair/BSC            Lateral/Scoot Transfers: Min assist, With slide board General transfer comment: min assist with setup and transfer to recliner from EOB with slide board     Balance Overall balance assessment: Needs assistance Sitting-balance support: Feet supported Sitting balance-Leahy Scale: Good Sitting balance - Comments: no assistance with sitting balance                                   ADL either performed or assessed with clinical judgement   ADL Overall ADL's : Needs assistance/impaired     Grooming: Brushing hair;Set up;Sitting   Upper Body Bathing: Minimal assistance;Sitting Upper Body Bathing Details (indicate cue type and reason): washed hair and UB seated in recliner     Upper Body Dressing : Set up;Sitting   Lower Body Dressing: Sitting/lateral leans;Bed level;Minimal assistance Lower Body Dressing Details (indicate cue type and reason): to change socks               General ADL Comments: performed UB bathing and washing hair seated  in recliner    Extremity/Trunk Assessment              Vision       Perception     Praxis      Cognition Arousal/Alertness: Awake/alert Behavior During Therapy: WFL for tasks assessed/performed Overall Cognitive Status: Within  Functional Limits for tasks assessed Area of Impairment: Safety/judgement                         Safety/Judgement: Decreased awareness of deficits     General Comments: continues to be self limiting        Exercises      Shoulder Instructions       General Comments      Pertinent Vitals/ Pain       Pain Assessment Pain Assessment: Faces Faces Pain Scale: Hurts little more Pain Location: R knee Pain Descriptors / Indicators: Discomfort Pain Intervention(s): Monitored during session, Repositioned  Home Living                                          Prior Functioning/Environment              Frequency  Min 2X/week        Progress Toward Goals  OT Goals(current goals can now be found in the care plan section)  Progress towards OT goals: Progressing toward goals  Acute Rehab OT Goals Patient Stated Goal: get better and go home OT Goal Formulation: With patient Time For Goal Achievement: 11/19/21 Potential to Achieve Goals: Fair ADL Goals Pt Will Perform Grooming: with modified independence;sitting Pt Will Perform Lower Body Bathing: with supervision;sitting/lateral leans;with adaptive equipment Pt Will Perform Lower Body Dressing: with adaptive equipment;sitting/lateral leans;with supervision Pt Will Transfer to Toilet: with min assist;with +2 assist;bedside commode;with transfer board Pt Will Perform Toileting - Clothing Manipulation and hygiene: with min assist;sitting/lateral leans Pt/caregiver will Perform Home Exercise Program: Increased ROM;Increased strength;Both right and left upper extremity;With written HEP provided;Independently;With theraband Additional ADL Goal #1: Pt will perform bed mobility modified independently in preparation for ADLs with HOB flat. Additional ADL Goal #2: Pt will be able to don prosthesis with min A in prep for ADLs/mobility  Plan Discharge plan remains appropriate;Frequency remains appropriate     Co-evaluation                 AM-PAC OT "6 Clicks" Daily Activity     Outcome Measure   Help from another person eating meals?: None Help from another person taking care of personal grooming?: None Help from another person toileting, which includes using toliet, bedpan, or urinal?: A Lot Help from another person bathing (including washing, rinsing, drying)?: A Lot Help from another person to put on and taking off regular upper body clothing?: A Little Help from another person to put on and taking off regular lower body clothing?: A Lot 6 Click Score: 17    End of Session Equipment Utilized During Treatment: Other (comment) (sliding board)  OT Visit Diagnosis: Muscle weakness (generalized) (M62.81);Unsteadiness on feet (R26.81);History of falling (Z91.81)   Activity Tolerance Patient tolerated treatment well   Patient Left in chair;with call bell/phone within reach;with chair alarm set   Nurse Communication Mobility status        Time: 1353-1419 OT Time Calculation (min): 26 min  Charges: OT General Charges $OT Visit: 1 Visit OT Treatments $  Self Care/Home Management : 8-22 mins $Therapeutic Activity: 8-22 mins  .rlo  [name] 11/12/2021, 2:40 PM

## 2021-11-12 NOTE — Plan of Care (Signed)
  Problem: Activity: Goal: Risk for activity intolerance will decrease Outcome: Progressing   Problem: Pain Managment: Goal: General experience of comfort will improve Outcome: Progressing   

## 2021-11-12 NOTE — Progress Notes (Signed)
[facility] Progress Note   Subjective:  Denies CP/dyspnea. Ongoing issues of leg pain - refuses stool softeners and chair dialysis.  Objective Vitals:   11/11/21 1759 11/11/21 1824 11/11/21 2131 11/12/21 0634  BP:  (!) 133/58 120/72 (!) 138/51  Pulse:  65 63 63  Resp:  18 18 16   Temp:  98.1 F (36.7 C) 98.1 F (36.7 C) 98 F (36.7 C)  TempSrc:  Oral  Oral  SpO2:  98% 100% 99%  Weight: 92.9 kg     Height:       Physical Exam General: Well appearing woman, NAD. Room air. Heart: RRR; no murmur Lungs: CTAB; no rales or wheezing Abdomen: soft and non-tender Extremities: L BKA with stump edema; 1+ B thigh edema Dialysis Access: L AVF + thrill  Additional Objective Labs: Basic Metabolic Panel: Recent Labs  Lab 11/08/21 0741 11/10/21 0303 11/11/21 1327  NA 130* 131* 128*  K 4.9 4.8 5.2*  CL 91* 93* 91*  CO2 27 29 26   GLUCOSE 140* 150* 159*  BUN 73* 71* 98*  CREATININE 5.93* 5.55* 6.90*  CALCIUM 9.2 9.1 8.8*  PHOS 5.3* 5.3* 5.6*   Liver Function Tests: Recent Labs  Lab 11/08/21 0741 11/10/21 0303 11/11/21 1327  ALBUMIN 2.9* 3.0* 3.0*   CBC: Recent Labs  Lab 11/06/21 1001 11/08/21 0740 11/11/21 1327  WBC 6.1 6.1 6.7  HGB 10.8* 11.3* 11.0*  HCT 34.1* 35.0* 34.0*  MCV 87.9 86.8 86.3  PLT 189 186 201   Medications:  sodium chloride     sodium chloride 10 mL/hr at 09/19/21 1310   methocarbamol (ROBAXIN) IV      (feeding supplement) PROSource Plus  30 mL Oral BID BM   alosetron  1 mg Oral BID   amitriptyline  25 mg Oral QHS   amLODipine  10 mg Oral Q24H   atorvastatin  40 mg Oral q1800   carvedilol  6.25 mg Oral BID   cyclobenzaprine  10 mg Oral TID   docusate sodium  200 mg Oral BID   DULoxetine  60 mg Oral Daily   famotidine  20 mg Oral Daily   furosemide  80 mg Oral Daily   heparin  5,000 Units Subcutaneous Q8H   hydrALAZINE  25 mg Oral TID   insulin aspart  0-6 Units Subcutaneous TID WC   insulin aspart  3 Units Subcutaneous  TID WC   insulin glargine-yfgn  5 Units Subcutaneous QHS   lactulose  20 g Oral Daily   multivitamin  1 tablet Oral QHS   pantoprazole  80 mg Oral Daily   polyethylene glycol  17 g Oral Daily   sevelamer carbonate  2,400 mg Oral TID WC   sodium chloride flush  3 mL Intravenous Q12H   sodium zirconium cyclosilicate  10 g Oral Once per day on Sun Tue Thu Sat    Dialysis Orders: MWF at [place], 400/500, EDW 90kg, 2K/2Ca, LUE AVF, heparin 4000 unit IV bolus - No ESA   Assessment/Plan: Recurrent L hip osteomyelitis: S/p 08/10/21 surgery (removal hip IMN which was placed 12/2019) with persistent VRE infection. Back to OR 08/28/21, 08/31/21 for L hip debridement and wound vac placement (now removed). Finished 6 week course of IV Daptomycin on 10/12/21. Management per primary team.  ESRD: Continue HD on usual MWF - next tomorrow (9/13). Hyperkalemia: Recurrent issues, on 2K bath + furosemide. Lokelma 10g added on non-HD days - seems better. AVF dysfunction: S/p branch ligation in OR 09/19/2021  per Dr. Virl Cagey. Was having intermittent cannulation issues, now working well. HTN/ volume: BP controlled on amlodipine/hydralazine/Coreg. Edema still present  - reminded on fluid restrictions (exceeds). Getting 4.5L UFG q HD. Anemia of ESRD: Hgb at goal, not on ESA.  Secondary HPTH: PTH/Ca/Phos ok. Not on VDRA. Nutrition: Alb low, continue protein supplements. DM2: Insulin per primary Dispo: SNF previously recommended, now she would like to go home on discharge. Dialyzed in recliner on 9/4 (although signed off HD early after 2.5hr) - try again when agreeable (refused on 9/6, 9/8).  Veneta Penton, PA-C 11/12/2021, 11:12 AM  [employer]

## 2021-11-12 NOTE — Progress Notes (Signed)
Occupational Therapy Treatment Patient Details Name: [name] MRN: [id number] DOB: 01/31/1966 Today's Date: 11/12/2021   History of present illness 56 y.o. female presented to ED 6/26 from dialysis with increased bloody drainage from L hip wound. 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 s/p hardware removal of left hip with partial resection of tuft tissue and femure that were nonviable on 08/10/21 Discharged home 6/23. underwent L hip debridement and wound vac placement 6/28; +Left intertrochanteric non-union,  Fracture of left distal femur  PMH: hypertension, hyperlipidemia, ESRD on HD MWF, history of left BKA in 2021, depression/anxiety, stroke, tobacco use, T2DM,  insomnia, chronic pain syndrome,   OT comments  Patient seen to assist back to bed from recliner. Patient required setup with slide board and min/mod assist to lateral scoot transfer back to EOB due to going against gravity.  Patient able to return to supine without assistance. Acute OT to continue to follow.    Recommendations for follow up therapy are one component of a multi-disciplinary discharge planning process, led by the attending physician.  Recommendations may be updated based on patient status, additional functional criteria and insurance authorization.    Follow Up Recommendations  Skilled nursing-short term rehab (<3 hours/day)    Assistance Recommended at Discharge Intermittent Supervision/Assistance  Patient can return home with the following  A lot of help with bathing/dressing/bathroom;Assistance with cooking/housework;Assist for transportation;Help with stairs or ramp for entrance;Two people to help with walking and/or transfers   Equipment Recommendations  Other (comment) (TBD)    Recommendations for Other Services      Precautions / Restrictions Precautions Precautions: Fall;Other (comment) Precaution Comments: L BKA (baseline) Restrictions Weight Bearing  Restrictions: Yes LLE Weight Bearing: Weight bearing as tolerated Other Position/Activity Restrictions: L distal femur fx; per Dr [name] 8/10 pt can full WB       Mobility Bed Mobility Overal bed mobility: Modified Independent Bed Mobility: Sit to Supine     Supine to sit: Modified independent (Device/Increase time) Sit to supine: Modified independent (Device/Increase time)   General bed mobility comments: increased time and effort    Transfers Overall transfer level: Needs assistance Equipment used: Sliding board Transfers: Bed to chair/wheelchair/BSC            Lateral/Scoot Transfers: Min assist, Mod assist, With slide board General transfer comment: min/mod assist to use slide board back to bed due to going up against gravity     Balance Overall balance assessment: Needs assistance Sitting-balance support: Feet supported Sitting balance-Leahy Scale: Good Sitting balance - Comments: no assistance with sitting balance                                   ADL either performed or assessed with clinical judgement   ADL Overall ADL's : Needs assistance/impaired     Grooming: Brushing hair;Set up;Sitting   Upper Body Bathing: Minimal assistance;Sitting Upper Body Bathing Details (indicate cue type and reason): washed hair and UB seated in recliner     Upper Body Dressing : Set up;Sitting   Lower Body Dressing: Sitting/lateral leans;Bed level;Minimal assistance Lower Body Dressing Details (indicate cue type and reason): to change socks               General ADL Comments: focused on transfer back to bed    Extremity/Trunk Assessment              Vision  Perception     Praxis      Cognition Arousal/Alertness: Awake/alert Behavior During Therapy: WFL for tasks assessed/performed Overall Cognitive Status: Within Functional Limits for tasks assessed Area of Impairment: Safety/judgement                          Safety/Judgement: Decreased awareness of deficits     General Comments: continues to be self limiting        Exercises      Shoulder Instructions       General Comments      Pertinent Vitals/ Pain       Pain Assessment Pain Assessment: Faces Faces Pain Scale: Hurts little more Pain Location: left hip Pain Descriptors / Indicators: Discomfort Pain Intervention(s): Monitored during session, Repositioned  Home Living                                          Prior Functioning/Environment              Frequency  Min 2X/week        Progress Toward Goals  OT Goals(current goals can now be found in the care plan section)  Progress towards OT goals: Progressing toward goals  Acute Rehab OT Goals Patient Stated Goal: get better and go home OT Goal Formulation: With patient Time For Goal Achievement: 11/19/21 Potential to Achieve Goals: Fair ADL Goals Pt Will Perform Grooming: with modified independence;sitting Pt Will Perform Lower Body Bathing: with supervision;sitting/lateral leans;with adaptive equipment Pt Will Perform Lower Body Dressing: with adaptive equipment;sitting/lateral leans;with supervision Pt Will Transfer to Toilet: with min assist;with +2 assist;bedside commode;with transfer board Pt Will Perform Toileting - Clothing Manipulation and hygiene: with min assist;sitting/lateral leans Pt/caregiver will Perform Home Exercise Program: Increased ROM;Increased strength;Both right and left upper extremity;With written HEP provided;Independently;With theraband Additional ADL Goal #1: Pt will perform bed mobility modified independently in preparation for ADLs with HOB flat. Additional ADL Goal #2: Pt will be able to don prosthesis with min A in prep for ADLs/mobility  Plan Discharge plan remains appropriate;Frequency remains appropriate    Co-evaluation                 AM-PAC OT "6 Clicks" Daily Activity     Outcome Measure    Help from another person eating meals?: None Help from another person taking care of personal grooming?: None Help from another person toileting, which includes using toliet, bedpan, or urinal?: A Lot Help from another person bathing (including washing, rinsing, drying)?: A Lot Help from another person to put on and taking off regular upper body clothing?: A Little Help from another person to put on and taking off regular lower body clothing?: A Lot 6 Click Score: 17    End of Session Equipment Utilized During Treatment: Other (comment) (slide board)  OT Visit Diagnosis: Muscle weakness (generalized) (M62.81);Unsteadiness on feet (R26.81);History of falling (Z91.81)   Activity Tolerance Patient tolerated treatment well   Patient Left in bed;with call bell/phone within reach;with bed alarm set   Nurse Communication Mobility status        Time: 7829-5621 OT Time Calculation (min): 13 min  Charges: OT General Charges $OT Visit: 1 Visit OT Treatments $Self Care/Home Management : 8-22 mins $Therapeutic Activity: 8-22 mins  [name], OTA Acute Rehabilitation Services  Office [phone]   [name] 11/12/2021, 3:18  PM

## 2021-11-12 NOTE — Progress Notes (Signed)
[facility] PROGRESS NOTE  Patient: [name] RDE:[id number]   PCP: [name], MD DOB: 1966/03/02   DOA: 08/26/2021   DOS: 11/12/2021    Subjective: Reports that her constipation is resolved.  Still has abdominal pain.  Still has nausea.  No vomiting.  Still minimal oral intake.  Still feels sore that she does not think she can tolerate dialysis in chair.  Objective:   Vitals:   11/11/21 1824 11/11/21 2131 11/12/21 0634 11/12/21 1744  BP: (!) 133/58 120/72 (!) 138/51 129/60  Pulse: 65 63 63 (!) 56  Resp: '18 18 16 19  ' Temp: 98.1 F (36.7 C) 98.1 F (36.7 C) 98 F (36.7 C) 98.4 F (36.9 C)  TempSrc: Oral  Oral   SpO2: 98% 100% 99% 100%  Weight:      Height:        S1-S2 present. Bowel sound present. Clear to auscultation. Trace edema on the right lower extremity.  Assessment and plan: Active Issues ESRD on HD Unable to tolerate HD in chair. I have encouraged patient multiple times to attempt HD chair as that will ensure that she can perform outpatient HD as she remains at very high risk for hospital-acquired infection given her ESRD status as well as open wound which will certainly limit her prognosis.  Other issues. 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 Orthopedic surgery consulted. ID consulted. Patient underwent hardware removal on 6/10 (prior to admission) with I&D on 6/28 and 7/1. Infectious disease recommendations for 6 weeks of Daptomycin; patient has completed course. Patient still with pain. Afebrile. No leukocytosis. CT hip with fluid collection and soft tissue thickening. Orthopedic surgery consulted and evaluated.  CRP/sed rate improved from prior at 0.6/36 respectively. Unlikely persistent infection. Recommendation for follow-up with Dr. [name] within 1 week of discharge. We will recheck ESR and CRP.   History of left intertrochanteric fracture This was previously managed  with IM nail placement in 2021. Hardware removed secondary to above.   Anasarca Improved with hemodialysis.   Hyponatremia Hypokalemia Hyperkalemia Management with hemodialysis   Anemia of chronic disease Acute blood loss anemia In setting of CKD in addition to blood loss from wound. Patient has received a total of 2 units of PRBC via transfusion. Hemoglobin stable.   Diabetes mellitus type 2 uncontrolled with hyperglycemia with CKD with long-term insulin use Most recent hemoglobin A1C of 7.1%. Patient is on insulin glargine and insulin aspart as an outpatient. -Continue Semglee 5 units qHS, Novolog 3 units TID with meals and SSI   Primary hypertension Continue amlodipine, hydralazine and Coreg   Hyperlipidemia History of CVA -Continue Lipitor   GERD -Continue Pepcid and Protonix   Tobacco use Tobacco cessation discussed during this admission.  Brief Hospital course [name] is a 56 y.o. female with a history of ESRD, anemia of CKD, diabetes mellitus type 2, hyperlipidemia, CVA, anxiety, depression, GERD, tobacco, left hip fracture s/p IM nail placement and recent hardware removal. Patient presented secondary to left hip bleeding and found to have evidence of subacute osteomyelitis and abscess of left femur. Orthopedic surgery/infectious disease consulted. Patient was started on empiric antibiotics and underwent multiple I&Ds. Patient completed a 6 week course of daptomycin. Discharge is pending ability for patient to tolerate HD in a chair which is complicated by patient's refusal/inability to sit in a chair secondary to pain. Patient tolerated HD in a chair on 9/4.   Principal Problem:  Subacute osteomyelitis of left femur with abscess  Active Problems:   ESRD (end stage renal disease) on dialysis ([place])   Type 2 diabetes mellitus with hyperlipidemia (HCC)   History of CVA (cerebrovascular accident)   Essential hypertension   Anxiety and depression    GASTROESOPHAGEAL REFLUX, NO ESOPHAGITIS   TOBACCO DEPENDENCE   Hyponatremia   Obesity (BMI 30.0-34.9)   Unilateral amputation of left foot (HCC)   Wound infection   History of CVA (cerebrovascular accident) without residual deficits   Hyperkalemia   Wound dehiscence, surgical, sequela    Author: Berle Mull, MD [facility] 11/12/2021 8:00 PM   If 7PM-7AM, please contact night-coverage at [email or web address]

## 2021-11-13 DIAGNOSIS — S98912S Complete traumatic amputation of left foot, level unspecified, sequela: Secondary | ICD-10-CM

## 2021-11-13 DIAGNOSIS — M86252 Subacute osteomyelitis, left femur: Secondary | ICD-10-CM | POA: Diagnosis not present

## 2021-11-13 DIAGNOSIS — E1169 Type 2 diabetes mellitus with other specified complication: Secondary | ICD-10-CM | POA: Diagnosis not present

## 2021-11-13 DIAGNOSIS — N186 End stage renal disease: Secondary | ICD-10-CM | POA: Diagnosis not present

## 2021-11-13 DIAGNOSIS — Z8673 Personal history of transient ischemic attack (TIA), and cerebral infarction without residual deficits: Secondary | ICD-10-CM | POA: Diagnosis not present

## 2021-11-13 LAB — GLUCOSE, CAPILLARY
Glucose-Capillary: 154 mg/dL — ABNORMAL HIGH (ref 70–99)
Glucose-Capillary: 269 mg/dL — ABNORMAL HIGH (ref 70–99)
Glucose-Capillary: 197 mg/dL — ABNORMAL HIGH (ref 70–99)

## 2021-11-13 LAB — RENAL FUNCTION PANEL
Albumin: 3 g/dL — ABNORMAL LOW (ref 3.5–5.0)
Anion gap: 9 (ref 5–15)
BUN: 74 mg/dL — ABNORMAL HIGH (ref 6–20)
CO2: 26 mmol/L (ref 22–32)
Calcium: 9 mg/dL (ref 8.9–10.3)
Chloride: 94 mmol/L — ABNORMAL LOW (ref 98–111)
Creatinine, Ser: 5.72 mg/dL — ABNORMAL HIGH (ref 0.44–1.00)
GFR, Estimated: 8 mL/min — ABNORMAL LOW (ref >60)
Glucose, Bld: 154 mg/dL — ABNORMAL HIGH (ref 70–99)
Phosphorus: 6.1 mg/dL — ABNORMAL HIGH (ref 2.5–4.6)
Potassium: 4.8 mmol/L (ref 3.5–5.1)
Sodium: 129 mmol/L — ABNORMAL LOW (ref 135–145)

## 2021-11-13 LAB — CBC
HCT: 34.7 % — ABNORMAL LOW (ref 36.0–46.0)
Hemoglobin: 11.4 g/dL — ABNORMAL LOW (ref 12.0–15.0)
MCH: 28.4 pg (ref 26.0–34.0)
MCHC: 32.9 g/dL (ref 30.0–36.0)
MCV: 86.3 fL (ref 80.0–100.0)
Platelets: 195 10*3/uL (ref 150–400)
RBC: 4.02 MIL/uL (ref 3.87–5.11)
RDW: 15.1 % (ref 11.5–15.5)
WBC: 6.6 10*3/uL (ref 4.0–10.5)
nRBC: 0 % (ref 0.0–0.2)

## 2021-11-13 LAB — SEDIMENTATION RATE: Sed Rate: 21 mm/hr (ref 0–22)

## 2021-11-13 MED ORDER — PENTAFLUOROPROP-TETRAFLUOROETH EX AERO: 1.0000 | INHALATION_SPRAY | CUTANEOUS | Status: DC | PRN

## 2021-11-13 MED ORDER — HEPARIN SODIUM (PORCINE) 1000 UNIT/ML DIALYSIS: 20.0000 [IU]/kg | INTRAMUSCULAR | Status: DC | PRN

## 2021-11-13 MED ORDER — HEPARIN SODIUM (PORCINE) 1000 UNIT/ML DIALYSIS
20.0000 [IU]/kg | Freq: Once | INTRAMUSCULAR | Status: AC
  Administered 2021-11-13: 1900 [IU] via INTRAVENOUS_CENTRAL
  Filled 2021-11-13: qty 2

## 2021-11-13 NOTE — Progress Notes (Signed)
PT Cancellation Note  Patient Details Name: [name] MRN: [id number] DOB: 1965/06/08   Cancelled Treatment:    Reason Eval/Treat Not Completed: Patient at procedure or test/unavailable  Patient off the floor for dialysis.    [place]  Office [phone]  [name] 11/13/2021, 10:20 AM

## 2021-11-13 NOTE — Procedures (Signed)
I was present at this dialysis session. I have reviewed the session itself and made appropriate changes.   HD in the bed this AM.  States too much pain to tolerating in chair.  She doesn't feel ready for a chair for the entirety of HD. K 4.8 on 2K bath. UF gola of 4.5L.  ? Call palliative for pain mgmt eval?  Filed Weights   11/11/21 1303 11/11/21 1759 11/13/21 0720  Weight: 97.4 kg 92.9 kg 94.3 kg    Recent Labs  Lab 11/13/21 0721  NA 129*  K 4.8  CL 94*  CO2 26  GLUCOSE 154*  BUN 74*  CREATININE 5.72*  CALCIUM 9.0  PHOS 6.1*    Recent Labs  Lab 11/08/21 0740 11/11/21 1327 11/13/21 0721  WBC 6.1 6.7 6.6  HGB 11.3* 11.0* 11.4*  HCT 35.0* 34.0* 34.7*  MCV 86.8 86.3 86.3  PLT 186 201 195    Scheduled Meds:  (feeding supplement) PROSource Plus  30 mL Oral BID BM   alosetron  1 mg Oral BID   amitriptyline  25 mg Oral QHS   amLODipine  10 mg Oral Q24H   atorvastatin  40 mg Oral q1800   carvedilol  6.25 mg Oral BID   cyclobenzaprine  10 mg Oral TID   docusate sodium  200 mg Oral BID   DULoxetine  60 mg Oral Daily   famotidine  20 mg Oral Daily   furosemide  80 mg Oral Daily   heparin  5,000 Units Subcutaneous Q8H   hydrALAZINE  25 mg Oral TID   insulin aspart  0-6 Units Subcutaneous TID WC   insulin aspart  3 Units Subcutaneous TID WC   insulin glargine-yfgn  5 Units Subcutaneous QHS   multivitamin  1 tablet Oral QHS   pantoprazole  80 mg Oral Daily   polyethylene glycol  17 g Oral Daily   sevelamer carbonate  2,400 mg Oral TID WC   sodium chloride flush  3 mL Intravenous Q12H   sodium zirconium cyclosilicate  10 g Oral Once per day on Sun Tue Thu Sat   Continuous Infusions:  sodium chloride     sodium chloride 10 mL/hr at 09/19/21 1310   methocarbamol (ROBAXIN) IV     PRN Meds:.sodium chloride, acetaminophen, bisacodyl, calcium carbonate, heparin, methocarbamol **OR** methocarbamol (ROBAXIN) IV, ondansetron **OR** ondansetron (ZOFRAN) IV, mouth rinse,  oxyCODONE, oxyCODONE, pentafluoroprop-tetrafluoroeth, prochlorperazine, sodium chloride flush   Pearson Grippe  MD 11/13/2021, 9:50 AM

## 2021-11-13 NOTE — Plan of Care (Signed)
  Problem: Activity: Goal: Risk for activity intolerance will decrease Outcome: Progressing   Problem: Pain Managment: Goal: General experience of comfort will improve Outcome: Progressing   Problem: Education: Goal: Individualized Educational Video(s) Outcome: Progressing   Problem: Fluid Volume: Goal: Compliance with measures to maintain balanced fluid volume will improve Outcome: Progressing   Problem: Health Behavior/Discharge Planning: Goal: Ability to manage health-related needs will improve Outcome: Progressing   Problem: Nutritional: Goal: Ability to make healthy dietary choices will improve Outcome: Progressing   Problem: Clinical Measurements: Goal: Complications related to the disease process, condition or treatment will be avoided or minimized Outcome: Progressing

## 2021-11-13 NOTE — Progress Notes (Signed)
Received patient in bed to unit.  Alert and oriented.  Informed consent signed and in chart.   Treatment initiated: 0730 Treatment completed: 1142  Patient tolerated well.  Transported back to the room  Alert, without acute distress.  Hand-off given to patient's nurse.   Access used: AVF Access issues: NA  Total UF removed: 4500 Medication(s) given: Heparin 1900 units Bolus, Zofran 4mg  IVP Post HD VS: 97.8    143/64    69    17    99% RA Post HD weight: 89.7kg   [facility]

## 2021-11-13 NOTE — Plan of Care (Signed)
  Problem: Activity: Goal: Risk for activity intolerance will decrease Outcome: Progressing   Problem: Pain Managment: Goal: General experience of comfort will improve Outcome: Progressing   

## 2021-11-13 NOTE — Progress Notes (Signed)
PROGRESS NOTE  [name] FAO:[id number] DOB: [birth year]   PCP: [name], MD   DOA: 08/26/2021 LOS: 79  Chief complaints Chief Complaint  Patient presents with   Wound Infection     Brief Narrative / Interim history: 56 y.o. female with a history of ESRD, anemia of CKD, diabetes mellitus type 2, hyperlipidemia, CVA, anxiety, depression, GERD, tobacco, left hip fracture s/p IM nail placement and recent hardware removal. Patient presented secondary to left hip bleeding and found to have evidence of subacute osteomyelitis and abscess of left femur. Orthopedic surgery/infectious disease consulted. Patient was started on empiric antibiotics and underwent multiple I&Ds. Patient completed a 6 week course of daptomycin. Discharge is pending ability for patient to tolerate HD in a chair which is complicated by patient's refusal/inability to sit in a chair secondary to pain.   Subjective: Seen and examined earlier this morning.  No major events overnight of this morning while on HD.  She complains nausea that she attributes to being hungry.  She complains of left hip pain.  She rates her pain 9/10 although she was sleeping when I saw her.  She denies chest pain, shortness of breath, vomiting or abdominal pain.  Objective: Vitals:   11/13/21 1130 11/13/21 1142 11/13/21 1150 11/13/21 1222  BP: 125/66 131/75 (!) 143/64 (!) 128/59  Pulse: 68 68 69 67  Resp: 10 19 17 17   Temp:   97.8 F (36.6 C) (!) 97 F (36.1 C)  TempSrc:   Oral Oral  SpO2: 99% 99% 99% 98%  Weight:   89.7 kg   Height:        Examination:  GENERAL: No apparent distress.  Nontoxic. HEENT: MMM.  Vision and hearing grossly intact.  NECK: Supple.  No apparent JVD.  RESP:  No IWOB.  Fair aeration bilaterally. CVS:  RRR. Heart sounds normal.  ABD/GI/GU: BS+. Abd soft, NTND.  MSK/EXT:  Moves extremities. No apparent deformity. No edema.  SKIN: Dry skin over left hip.  Slight swelling but no erythema.  Left BKA. NEURO:  Still P but wakes to voice easily.  Oriented x4.  No apparent focal neuro deficit. PSYCH: Calm. Normal affect.    Assessment and plan: 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  Active Problems:   ESRD (end stage renal disease) on dialysis ([place])   Type 2 diabetes mellitus with hyperlipidemia (HCC)   History of CVA (cerebrovascular accident)   Essential hypertension   Anxiety and depression   GASTROESOPHAGEAL REFLUX, NO ESOPHAGITIS   TOBACCO DEPENDENCE   Hyponatremia   Obesity (BMI 30.0-34.9)   Unilateral amputation of left foot (HCC)   Wound infection   History of CVA (cerebrovascular accident) without residual deficits   Hyperkalemia   Wound dehiscence, surgical, sequela  ESRD on HD MWF/anasarca -Difficulty tolerating HD in chair for outpatient HD. -Per nephrology.   Subacute osteomyelitis of left femur with abscess/infected left hip hardware s/p removal on 6/10 prior to admission with I&D on 6/28 and 7/1 -Patient completed 6 weeks of appropriate antibiotics per ID recommendation.   -CT hip with fluid collection and soft tissue thickening.  Per Ortho, low suspicion for persistent infection and  recommended outpatient f/up with Dr. [name] 1 week after discharge.   History of left intertrochanteric fracture: previously managed with IM nail placement in 2021. Hardware removed secondary to above.   Hyponatremia/Hypokalemia/Hyperkalemia -Management with hemodialysis   ABLA superimposed on anemia of chronic disease: Stable. Recent Labs    10/22/21 [phone]  10/23/21 0556 10/25/21 0800 10/30/21 1347 11/01/21 0700 11/04/21 1437 11/06/21 1001 11/08/21 0740 11/11/21 1327 11/13/21 0721  HGB 11.9* 12.0 12.2 11.5* 11.4* 11.5* 10.8* 11.3* 11.0* 11.4*  -Monitor intermittently   Uncontrolled IDDM-2 with hyperglycemia and ESRD: A1c 7.1% in 07/2021 Recent Labs  Lab 11/11/21 2131 11/12/21 1151 11/12/21 1637 11/12/21 2037 11/13/21 1227  GLUCAP 222* 189*  116* 155* 154*  -Continue current insulin regimen -Recheck A1c   Primary hypertension -Continue amlodipine, hydralazine and Coreg   History of CVA/HLD -Continue Lipitor   GERD -Continue Pepcid and Protonix   Tobacco use -Tobacco cessation discussed during this admission.  Anxiety and depression: -Continue home meds  Body mass index is 28.37 kg/m.          DVT prophylaxis:  Place TED hose Start: 09/06/21 1507 SCDs Start: 08/31/21 1058 heparin injection 5,000 Units Start: 08/26/21 1615  Code Status: Nephrology Family Communication: None at bedside Level of care: Med-Surg Status is: Inpatient Remains inpatient appropriate because: Lack of safe disposition and not tolerating HD in chair.   Final disposition: SNF Consultants:  Nephrology Orthopedic surgery   Sch Meds:  Scheduled Meds:  (feeding supplement) PROSource Plus  30 mL Oral BID BM   alosetron  1 mg Oral BID   amitriptyline  25 mg Oral QHS   amLODipine  10 mg Oral Q24H   atorvastatin  40 mg Oral q1800   carvedilol  6.25 mg Oral BID   cyclobenzaprine  10 mg Oral TID   docusate sodium  200 mg Oral BID   DULoxetine  60 mg Oral Daily   famotidine  20 mg Oral Daily   furosemide  80 mg Oral Daily   heparin  5,000 Units Subcutaneous Q8H   hydrALAZINE  25 mg Oral TID   insulin aspart  0-6 Units Subcutaneous TID WC   insulin aspart  3 Units Subcutaneous TID WC   insulin glargine-yfgn  5 Units Subcutaneous QHS   multivitamin  1 tablet Oral QHS   pantoprazole  80 mg Oral Daily   polyethylene glycol  17 g Oral Daily   sevelamer carbonate  2,400 mg Oral TID WC   sodium chloride flush  3 mL Intravenous Q12H   sodium zirconium cyclosilicate  10 g Oral Once per day on Sun Tue Thu Sat   Continuous Infusions:  sodium chloride     sodium chloride 10 mL/hr at 09/19/21 1310   methocarbamol (ROBAXIN) IV     PRN Meds:.sodium chloride, acetaminophen, bisacodyl, calcium carbonate, methocarbamol **OR** methocarbamol  (ROBAXIN) IV, ondansetron **OR** ondansetron (ZOFRAN) IV, mouth rinse, oxyCODONE, oxyCODONE, prochlorperazine, sodium chloride flush  Antimicrobials: Anti-infectives (From admission, onward)    Start     Dose/Rate Route Frequency Ordered Stop   10/02/21 2000  DAPTOmycin (CUBICIN) 900 mg in sodium chloride 0.9 % IVPB  Status:  Discontinued        10 mg/kg  92.4 kg 136 mL/hr over 30 Minutes Intravenous Once per day on Mon Wed Fri 10/01/21 1350 10/12/21 1146   09/30/21 2000  DAPTOmycin (CUBICIN) 900 mg in sodium chloride 0.9 % IVPB  Status:  Discontinued        10 mg/kg  92.4 kg 136 mL/hr over 30 Minutes Intravenous Daily 09/28/21 0919 10/01/21 1350   09/28/21 2000  DAPTOmycin (CUBICIN) 900 mg in sodium chloride 0.9 % IVPB        900 mg 136 mL/hr over 30 Minutes Intravenous  Once 09/28/21 0919 09/28/21 2146   09/02/21 2000  DAPTOmycin (  CUBICIN) 900 mg in sodium chloride 0.9 % IVPB  Status:  Discontinued        10 mg/kg  92.4 kg 136 mL/hr over 30 Minutes Intravenous Once per day on Mon Wed Fri 09/02/21 0836 09/28/21 0919   08/31/21 0745  ceFAZolin (ANCEF) IVPB 2g/100 mL premix        2 g 200 mL/hr over 30 Minutes Intravenous To Short Stay 08/31/21 0730 08/31/21 0918   08/30/21 0600  ceFAZolin (ANCEF) IVPB 2g/100 mL premix  Status:  Discontinued        2 g 200 mL/hr over 30 Minutes Intravenous To Short Stay 08/30/21 0123 08/31/21 0600   08/29/21 2000  DAPTOmycin (CUBICIN) 900 mg in sodium chloride 0.9 % IVPB  Status:  Discontinued        10 mg/kg  92.4 kg 136 mL/hr over 30 Minutes Intravenous Every 48 hours 08/29/21 1627 09/02/21 0836   08/28/21 1800  ceFAZolin (ANCEF) IVPB 2g/100 mL premix  Status:  Discontinued        2 g 200 mL/hr over 30 Minutes Intravenous Every M-W-F (1800) 08/27/21 0846 08/29/21 1620   08/28/21 1200  ceFAZolin (ANCEF) IVPB 1 g/50 mL premix  Status:  Discontinued        1 g 100 mL/hr over 30 Minutes Intravenous Every M-W-F (Hemodialysis) 08/26/21 1954 08/27/21  0846   08/28/21 0600  ceFAZolin (ANCEF) IVPB 2g/100 mL premix        2 g 200 mL/hr over 30 Minutes Intravenous On call to O.R. 08/27/21 1150 08/28/21 0909   08/27/21 1800  ceFAZolin (ANCEF) IVPB 1 g/50 mL premix        1 g 100 mL/hr over 30 Minutes Intravenous  Once 08/26/21 1954 08/27/21 2048   08/26/21 1500  ceFEPIme (MAXIPIME) 1 g in sodium chloride 0.9 % 100 mL IVPB  Status:  Discontinued        1 g 200 mL/hr over 30 Minutes Intravenous Every 24 hours 08/26/21 1452 08/26/21 1714        I have personally reviewed the following labs and images: CBC: Recent Labs  Lab 11/08/21 0740 11/11/21 1327 11/13/21 0721  WBC 6.1 6.7 6.6  HGB 11.3* 11.0* 11.4*  HCT 35.0* 34.0* 34.7*  MCV 86.8 86.3 86.3  PLT 186 201 195   BMP &GFR Recent Labs  Lab 11/08/21 0741 11/10/21 0303 11/11/21 1327 11/13/21 0721  NA 130* 131* 128* 129*  K 4.9 4.8 5.2* 4.8  CL 91* 93* 91* 94*  CO2 27 29 26 26   GLUCOSE 140* 150* 159* 154*  BUN 73* 71* 98* 74*  CREATININE 5.93* 5.55* 6.90* 5.72*  CALCIUM 9.2 9.1 8.8* 9.0  PHOS 5.3* 5.3* 5.6* 6.1*   Estimated Creatinine Clearance: 13.7 mL/min (A) (by C-G formula based on SCr of 5.72 mg/dL (H)). Liver & Pancreas: Recent Labs  Lab 11/08/21 0741 11/10/21 0303 11/11/21 1327 11/13/21 0721  ALBUMIN 2.9* 3.0* 3.0* 3.0*   No results for input(s): "LIPASE", "AMYLASE" in the last 168 hours. No results for input(s): "AMMONIA" in the last 168 hours. Diabetic: No results for input(s): "HGBA1C" in the last 72 hours. Recent Labs  Lab 11/11/21 2131 11/12/21 1151 11/12/21 1637 11/12/21 2037 11/13/21 1227  GLUCAP 222* 189* 116* 155* 154*   Cardiac Enzymes: No results for input(s): "CKTOTAL", "CKMB", "CKMBINDEX", "TROPONINI" in the last 168 hours. No results for input(s): "PROBNP" in the last 8760 hours. Coagulation Profile: No results for input(s): "INR", "PROTIME" in the last 168 hours. Thyroid Function  Tests: No results for input(s): "TSH", "T4TOTAL",  "FREET4", "T3FREE", "THYROIDAB" in the last 72 hours. Lipid Profile: No results for input(s): "CHOL", "HDL", "LDLCALC", "TRIG", "CHOLHDL", "LDLDIRECT" in the last 72 hours. Anemia Panel: No results for input(s): "VITAMINB12", "FOLATE", "FERRITIN", "TIBC", "IRON", "RETICCTPCT" in the last 72 hours. Urine analysis:    Component Value Date/Time   COLORURINE YELLOW (A) 08/02/2021 1750   APPEARANCEUR HAZY (A) 08/02/2021 1750   LABSPEC 1.014 08/02/2021 1750   PHURINE 5.0 08/02/2021 1750   GLUCOSEU 50 (A) 08/02/2021 1750   GLUCOSEU NEGATIVE 03/29/2014 1259   HGBUR NEGATIVE 08/02/2021 1750   BILIRUBINUR NEGATIVE 08/02/2021 1750   BILIRUBINUR neg 02/16/2017 1501   KETONESUR NEGATIVE 08/02/2021 1750   PROTEINUR 100 (A) 08/02/2021 1750   UROBILINOGEN 0.2 02/16/2017 1501   UROBILINOGEN 0.2 03/29/2014 1259   NITRITE NEGATIVE 08/02/2021 1750   LEUKOCYTESUR TRACE (A) 08/02/2021 1750   Sepsis Labs: Invalid input(s): "PROCALCITONIN", "LACTICIDVEN"  Microbiology: No results found for this or any previous visit (from the past 240 hour(s)).  Radiology Studies: No results found.    [place] [facility] [place]  If 7PM-7AM, please contact night-coverage [email or web address] 11/13/2021, 12:44 PM

## 2021-11-14 DIAGNOSIS — E1169 Type 2 diabetes mellitus with other specified complication: Secondary | ICD-10-CM | POA: Diagnosis not present

## 2021-11-14 DIAGNOSIS — Z8673 Personal history of transient ischemic attack (TIA), and cerebral infarction without residual deficits: Secondary | ICD-10-CM | POA: Diagnosis not present

## 2021-11-14 DIAGNOSIS — M86252 Subacute osteomyelitis, left femur: Secondary | ICD-10-CM | POA: Diagnosis not present

## 2021-11-14 DIAGNOSIS — N186 End stage renal disease: Secondary | ICD-10-CM | POA: Diagnosis not present

## 2021-11-14 LAB — MAGNESIUM: Magnesium: 1.6 mg/dL — ABNORMAL LOW (ref 1.7–2.4)

## 2021-11-14 LAB — CBC WITH DIFFERENTIAL/PLATELET
Abs Immature Granulocytes: 0.02 10*3/uL (ref 0.00–0.07)
Basophils Absolute: 0.1 10*3/uL (ref 0.0–0.1)
Basophils Relative: 1 %
Eosinophils Absolute: 0.2 10*3/uL (ref 0.0–0.5)
Eosinophils Relative: 4 %
HCT: 36.2 % (ref 36.0–46.0)
Hemoglobin: 11.7 g/dL — ABNORMAL LOW (ref 12.0–15.0)
Immature Granulocytes: 0 %
Lymphocytes Relative: 27 %
Lymphs Abs: 1.8 10*3/uL (ref 0.7–4.0)
MCH: 27.5 pg (ref 26.0–34.0)
MCHC: 32.3 g/dL (ref 30.0–36.0)
MCV: 85.2 fL (ref 80.0–100.0)
Monocytes Absolute: 1.2 10*3/uL — ABNORMAL HIGH (ref 0.1–1.0)
Monocytes Relative: 18 %
Neutro Abs: 3.3 10*3/uL (ref 1.7–7.7)
Neutrophils Relative %: 50 %
Platelets: 196 10*3/uL (ref 150–400)
RBC: 4.25 MIL/uL (ref 3.87–5.11)
RDW: 15.3 % (ref 11.5–15.5)
WBC: 6.7 10*3/uL (ref 4.0–10.5)
nRBC: 0 % (ref 0.0–0.2)

## 2021-11-14 LAB — RENAL FUNCTION PANEL
Albumin: 3 g/dL — ABNORMAL LOW (ref 3.5–5.0)
Anion gap: 12 (ref 5–15)
BUN: 48 mg/dL — ABNORMAL HIGH (ref 6–20)
CO2: 28 mmol/L (ref 22–32)
Calcium: 9.1 mg/dL (ref 8.9–10.3)
Chloride: 90 mmol/L — ABNORMAL LOW (ref 98–111)
Creatinine, Ser: 4.52 mg/dL — ABNORMAL HIGH (ref 0.44–1.00)
GFR, Estimated: 11 mL/min — ABNORMAL LOW (ref >60)
Glucose, Bld: 166 mg/dL — ABNORMAL HIGH (ref 70–99)
Phosphorus: 4.6 mg/dL (ref 2.5–4.6)
Potassium: 4.6 mmol/L (ref 3.5–5.1)
Sodium: 130 mmol/L — ABNORMAL LOW (ref 135–145)

## 2021-11-14 LAB — GLUCOSE, CAPILLARY
Glucose-Capillary: 227 mg/dL — ABNORMAL HIGH (ref 70–99)
Glucose-Capillary: 190 mg/dL — ABNORMAL HIGH (ref 70–99)
Glucose-Capillary: 175 mg/dL — ABNORMAL HIGH (ref 70–99)
Glucose-Capillary: 199 mg/dL — ABNORMAL HIGH (ref 70–99)

## 2021-11-14 LAB — HEMOGLOBIN A1C
Hgb A1c MFr Bld: 5.9 % — ABNORMAL HIGH (ref 4.8–5.6)
Mean Plasma Glucose: 122.63 mg/dL

## 2021-11-14 MED ORDER — INSULIN ASPART 100 UNIT/ML IJ SOLN
0.0000 [IU] | Freq: Three times a day (TID) | INTRAMUSCULAR | Status: DC
  Administered 2021-11-14 (×2): 2 [IU] via SUBCUTANEOUS
  Administered 2021-11-15: 1 [IU] via SUBCUTANEOUS
  Administered 2021-11-15: 2 [IU] via SUBCUTANEOUS
  Administered 2021-11-16: 5 [IU] via SUBCUTANEOUS
  Administered 2021-11-16: 1 [IU] via SUBCUTANEOUS
  Administered 2021-11-16 – 2021-11-18 (×5): 2 [IU] via SUBCUTANEOUS
  Administered 2021-11-18 – 2021-11-19 (×2): 5 [IU] via SUBCUTANEOUS
  Administered 2021-11-19 – 2021-11-20 (×2): 3 [IU] via SUBCUTANEOUS
  Administered 2021-11-20: 5 [IU] via SUBCUTANEOUS
  Administered 2021-11-21 (×2): 3 [IU] via SUBCUTANEOUS
  Administered 2021-11-21: 2 [IU] via SUBCUTANEOUS
  Administered 2021-11-22: 1 [IU] via SUBCUTANEOUS
  Administered 2021-11-22 (×2): 2 [IU] via SUBCUTANEOUS
  Administered 2021-11-23: 3 [IU] via SUBCUTANEOUS
  Administered 2021-11-23: 2 [IU] via SUBCUTANEOUS
  Administered 2021-11-23: 3 [IU] via SUBCUTANEOUS
  Administered 2021-11-24: 1 [IU] via SUBCUTANEOUS
  Administered 2021-11-24: 2 [IU] via SUBCUTANEOUS
  Administered 2021-11-24: 1 [IU] via SUBCUTANEOUS
  Administered 2021-11-25: 3 [IU] via SUBCUTANEOUS
  Administered 2021-11-25: 2 [IU] via SUBCUTANEOUS
  Administered 2021-11-26 (×3): 1 [IU] via SUBCUTANEOUS
  Administered 2021-11-27 – 2021-11-28 (×5): 2 [IU] via SUBCUTANEOUS
  Administered 2021-11-29: 3 [IU] via SUBCUTANEOUS
  Administered 2021-11-30: 1 [IU] via SUBCUTANEOUS
  Administered 2021-11-30 (×2): 2 [IU] via SUBCUTANEOUS
  Administered 2021-12-01 – 2021-12-04 (×6): 1 [IU] via SUBCUTANEOUS
  Administered 2021-12-04: 2 [IU] via SUBCUTANEOUS
  Administered 2021-12-05: 1 [IU] via SUBCUTANEOUS
  Administered 2021-12-05: 3 [IU] via SUBCUTANEOUS
  Administered 2021-12-05 – 2021-12-06 (×2): 1 [IU] via SUBCUTANEOUS
  Administered 2021-12-06: 3 [IU] via SUBCUTANEOUS
  Administered 2021-12-07: 1 [IU] via SUBCUTANEOUS
  Administered 2021-12-07 – 2021-12-10 (×6): 2 [IU] via SUBCUTANEOUS
  Administered 2021-12-10: 1 [IU] via SUBCUTANEOUS
  Administered 2021-12-10 – 2021-12-11 (×2): 2 [IU] via SUBCUTANEOUS
  Administered 2021-12-11: 5 [IU] via SUBCUTANEOUS
  Administered 2021-12-12: 2 [IU] via SUBCUTANEOUS
  Administered 2021-12-12: 3 [IU] via SUBCUTANEOUS
  Administered 2021-12-12 – 2021-12-14 (×4): 2 [IU] via SUBCUTANEOUS
  Administered 2021-12-14: 3 [IU] via SUBCUTANEOUS
  Administered 2021-12-14: 2 [IU] via SUBCUTANEOUS
  Administered 2021-12-15: 1 [IU] via SUBCUTANEOUS
  Administered 2021-12-15: 2 [IU] via SUBCUTANEOUS
  Administered 2021-12-15: 3 [IU] via SUBCUTANEOUS
  Administered 2021-12-16 (×2): 2 [IU] via SUBCUTANEOUS
  Administered 2021-12-17: 1 [IU] via SUBCUTANEOUS
  Administered 2021-12-17 – 2021-12-18 (×4): 2 [IU] via SUBCUTANEOUS
  Administered 2021-12-19: 1 [IU] via SUBCUTANEOUS
  Administered 2021-12-19: 3 [IU] via SUBCUTANEOUS
  Administered 2021-12-19: 2 [IU] via SUBCUTANEOUS
  Administered 2021-12-20: 3 [IU] via SUBCUTANEOUS
  Administered 2021-12-21 (×2): 2 [IU] via SUBCUTANEOUS
  Administered 2021-12-21 – 2021-12-22 (×2): 3 [IU] via SUBCUTANEOUS
  Administered 2021-12-22 – 2021-12-24 (×5): 2 [IU] via SUBCUTANEOUS
  Administered 2021-12-25: 1 [IU] via SUBCUTANEOUS
  Administered 2021-12-25 – 2021-12-26 (×3): 2 [IU] via SUBCUTANEOUS
  Administered 2021-12-26: 1 [IU] via SUBCUTANEOUS
  Administered 2021-12-26 – 2021-12-27 (×3): 2 [IU] via SUBCUTANEOUS
  Administered 2021-12-28: 1 [IU] via SUBCUTANEOUS
  Administered 2021-12-28: 3 [IU] via SUBCUTANEOUS
  Administered 2021-12-29: 1 [IU] via SUBCUTANEOUS
  Administered 2021-12-29 – 2022-01-01 (×5): 2 [IU] via SUBCUTANEOUS
  Administered 2022-01-02: 1 [IU] via SUBCUTANEOUS
  Administered 2022-01-02 (×2): 2 [IU] via SUBCUTANEOUS
  Administered 2022-01-03: 3 [IU] via SUBCUTANEOUS
  Administered 2022-01-03: 1 [IU] via SUBCUTANEOUS
  Administered 2022-01-04: 3 [IU] via SUBCUTANEOUS
  Administered 2022-01-04: 5 [IU] via SUBCUTANEOUS
  Administered 2022-01-05: 1 [IU] via SUBCUTANEOUS
  Administered 2022-01-05: 2 [IU] via SUBCUTANEOUS
  Administered 2022-01-05: 3 [IU] via SUBCUTANEOUS
  Administered 2022-01-06 (×2): 2 [IU] via SUBCUTANEOUS
  Administered 2022-01-06 – 2022-01-07 (×2): 3 [IU] via SUBCUTANEOUS
  Administered 2022-01-07 (×2): 2 [IU] via SUBCUTANEOUS
  Administered 2022-01-08: 3 [IU] via SUBCUTANEOUS
  Administered 2022-01-08 – 2022-01-09 (×4): 2 [IU] via SUBCUTANEOUS
  Administered 2022-01-10: 4 [IU] via SUBCUTANEOUS
  Administered 2022-01-11: 2 [IU] via SUBCUTANEOUS
  Administered 2022-01-11: 5 [IU] via SUBCUTANEOUS
  Administered 2022-01-11: 2 [IU] via SUBCUTANEOUS
  Administered 2022-01-12: 3 [IU] via SUBCUTANEOUS
  Administered 2022-01-12 – 2022-01-14 (×6): 2 [IU] via SUBCUTANEOUS
  Administered 2022-01-14: 1 [IU] via SUBCUTANEOUS
  Administered 2022-01-15: 2 [IU] via SUBCUTANEOUS
  Administered 2022-01-15: 3 [IU] via SUBCUTANEOUS
  Administered 2022-01-16: 2 [IU] via SUBCUTANEOUS
  Administered 2022-01-16: 3 [IU] via SUBCUTANEOUS
  Administered 2022-01-16: 2 [IU] via SUBCUTANEOUS
  Administered 2022-01-17: 5 [IU] via SUBCUTANEOUS
  Administered 2022-01-18: 2 [IU] via SUBCUTANEOUS
  Administered 2022-01-18: 3 [IU] via SUBCUTANEOUS
  Administered 2022-01-18: 2 [IU] via SUBCUTANEOUS
  Administered 2022-01-19: 3 [IU] via SUBCUTANEOUS
  Administered 2022-01-19 (×2): 2 [IU] via SUBCUTANEOUS
  Administered 2022-01-20: 5 [IU] via SUBCUTANEOUS
  Administered 2022-01-20: 1 [IU] via SUBCUTANEOUS
  Administered 2022-01-21: 3 [IU] via SUBCUTANEOUS
  Administered 2022-01-22 (×2): 2 [IU] via SUBCUTANEOUS
  Administered 2022-01-22: 5 [IU] via SUBCUTANEOUS
  Administered 2022-01-23: 1 [IU] via SUBCUTANEOUS
  Administered 2022-01-23: 2 [IU] via SUBCUTANEOUS
  Administered 2022-01-23: 8 [IU] via SUBCUTANEOUS
  Administered 2022-01-24: 1 [IU] via SUBCUTANEOUS
  Administered 2022-01-24: 5 [IU] via SUBCUTANEOUS
  Administered 2022-01-25: 3 [IU] via SUBCUTANEOUS
  Administered 2022-01-25: 2 [IU] via SUBCUTANEOUS
  Administered 2022-01-26: 1 [IU] via SUBCUTANEOUS
  Administered 2022-01-26: 3 [IU] via SUBCUTANEOUS
  Administered 2022-01-26 – 2022-01-27 (×2): 1 [IU] via SUBCUTANEOUS
  Administered 2022-01-27: 3 [IU] via SUBCUTANEOUS
  Administered 2022-01-28: 1 [IU] via SUBCUTANEOUS
  Administered 2022-01-28: 2 [IU] via SUBCUTANEOUS
  Administered 2022-01-29: 3 [IU] via SUBCUTANEOUS
  Administered 2022-01-29: 2 [IU] via SUBCUTANEOUS
  Administered 2022-01-30: 1 [IU] via SUBCUTANEOUS
  Administered 2022-01-30: 2 [IU] via SUBCUTANEOUS
  Administered 2022-01-30: 1 [IU] via SUBCUTANEOUS
  Administered 2022-01-31: 2 [IU] via SUBCUTANEOUS
  Administered 2022-01-31 – 2022-02-01 (×2): 3 [IU] via SUBCUTANEOUS
  Administered 2022-02-01: 1 [IU] via SUBCUTANEOUS
  Administered 2022-02-01: 3 [IU] via SUBCUTANEOUS
  Administered 2022-02-02: 2 [IU] via SUBCUTANEOUS
  Administered 2022-02-02: 1 [IU] via SUBCUTANEOUS
  Administered 2022-02-02 – 2022-02-03 (×2): 2 [IU] via SUBCUTANEOUS
  Administered 2022-02-03 – 2022-02-04 (×2): 1 [IU] via SUBCUTANEOUS
  Administered 2022-02-05: 5 [IU] via SUBCUTANEOUS
  Administered 2022-02-05 – 2022-02-06 (×2): 2 [IU] via SUBCUTANEOUS
  Administered 2022-02-06: 1 [IU] via SUBCUTANEOUS
  Administered 2022-02-06 – 2022-02-07 (×2): 2 [IU] via SUBCUTANEOUS
  Administered 2022-02-07: 1 [IU] via SUBCUTANEOUS
  Administered 2022-02-08 (×2): 2 [IU] via SUBCUTANEOUS
  Administered 2022-02-08 – 2022-02-09 (×3): 1 [IU] via SUBCUTANEOUS
  Administered 2022-02-09: 2 [IU] via SUBCUTANEOUS
  Administered 2022-02-10: 1 [IU] via SUBCUTANEOUS
  Administered 2022-02-11 – 2022-02-12 (×3): 2 [IU] via SUBCUTANEOUS
  Administered 2022-02-12: 5 [IU] via SUBCUTANEOUS
  Administered 2022-02-13: 2 [IU] via SUBCUTANEOUS
  Administered 2022-02-13: 1 [IU] via SUBCUTANEOUS
  Administered 2022-02-13: 3 [IU] via SUBCUTANEOUS
  Administered 2022-02-14: 2 [IU] via SUBCUTANEOUS
  Administered 2022-02-14: 3 [IU] via SUBCUTANEOUS
  Administered 2022-02-15: 1 [IU] via SUBCUTANEOUS
  Administered 2022-02-15: 3 [IU] via SUBCUTANEOUS
  Administered 2022-02-15: 1 [IU] via SUBCUTANEOUS
  Administered 2022-02-16: 2 [IU] via SUBCUTANEOUS
  Administered 2022-02-16: 1 [IU] via SUBCUTANEOUS
  Administered 2022-02-16: 2 [IU] via SUBCUTANEOUS
  Administered 2022-02-17: 5 [IU] via SUBCUTANEOUS
  Administered 2022-02-17 (×2): 2 [IU] via SUBCUTANEOUS
  Administered 2022-02-18: 1 [IU] via SUBCUTANEOUS
  Administered 2022-02-18: 2 [IU] via SUBCUTANEOUS
  Administered 2022-02-18: 6 [IU] via SUBCUTANEOUS
  Administered 2022-02-20 – 2022-02-21 (×3): 2 [IU] via SUBCUTANEOUS
  Administered 2022-02-22: 5 [IU] via SUBCUTANEOUS
  Administered 2022-02-22: 1 [IU] via SUBCUTANEOUS
  Administered 2022-02-23 (×2): 2 [IU] via SUBCUTANEOUS
  Administered 2022-02-24: 1 [IU] via SUBCUTANEOUS
  Administered 2022-02-24: 2 [IU] via SUBCUTANEOUS
  Administered 2022-02-25: 1 [IU] via SUBCUTANEOUS
  Administered 2022-02-25: 2 [IU] via SUBCUTANEOUS
  Administered 2022-02-26: 1 [IU] via SUBCUTANEOUS
  Administered 2022-02-26 – 2022-02-28 (×2): 3 [IU] via SUBCUTANEOUS
  Administered 2022-02-28 – 2022-03-01 (×2): 2 [IU] via SUBCUTANEOUS
  Administered 2022-03-01: 1 [IU] via SUBCUTANEOUS
  Administered 2022-03-01 – 2022-03-03 (×4): 2 [IU] via SUBCUTANEOUS
  Administered 2022-03-03: 3 [IU] via SUBCUTANEOUS
  Administered 2022-03-03: 2 [IU] via SUBCUTANEOUS
  Administered 2022-03-04: 1 [IU] via SUBCUTANEOUS
  Administered 2022-03-05: 2 [IU] via SUBCUTANEOUS
  Administered 2022-03-05: 6 [IU] via SUBCUTANEOUS
  Administered 2022-03-06: 2 [IU] via SUBCUTANEOUS
  Administered 2022-03-07: 3 [IU] via SUBCUTANEOUS
  Administered 2022-03-07 – 2022-03-08 (×2): 2 [IU] via SUBCUTANEOUS
  Administered 2022-03-08: 1 [IU] via SUBCUTANEOUS
  Administered 2022-03-08 – 2022-03-09 (×2): 2 [IU] via SUBCUTANEOUS
  Administered 2022-03-09: 1 [IU] via SUBCUTANEOUS
  Administered 2022-03-10: 7 [IU] via SUBCUTANEOUS
  Administered 2022-03-11: 1 [IU] via SUBCUTANEOUS
  Administered 2022-03-11: 3 [IU] via SUBCUTANEOUS
  Administered 2022-03-13: 2 [IU] via SUBCUTANEOUS
  Administered 2022-03-13 – 2022-03-14 (×3): 1 [IU] via SUBCUTANEOUS
  Administered 2022-03-14 – 2022-03-15 (×2): 3 [IU] via SUBCUTANEOUS
  Administered 2022-03-15 – 2022-03-16 (×3): 2 [IU] via SUBCUTANEOUS
  Administered 2022-03-16: 1 [IU] via SUBCUTANEOUS
  Administered 2022-03-17: 5 [IU] via SUBCUTANEOUS
  Administered 2022-03-17 – 2022-03-18 (×2): 2 [IU] via SUBCUTANEOUS
  Administered 2022-03-18: 1 [IU] via SUBCUTANEOUS

## 2021-11-14 NOTE — Progress Notes (Signed)
PT Cancellation Note  Patient Details Name: [name] MRN: [id number] DOB: Sep 12, 1965   Cancelled Treatment:    Reason Eval/Treat Not Completed: (P) Pain limiting ability to participate Pt reports migraine and is not up to working with therapy today.  [place] Doster B. [name] PT, DPT Acute Rehabilitation Services Please use secure chat or  Call Office [phone]   [place] 11/14/2021, 2:20 PM

## 2021-11-14 NOTE — Progress Notes (Signed)
OT Cancellation Note  Patient Details Name: MAITLAND MUHLBAUER MRN: [id number] DOB: [birth year]   Cancelled Treatment:    Reason Eval/Treat Not Completed: Patient declined, no reason specified (returned for second attempt.  Patient states she continue to have a migrane even after medication and her back hurts.  OT to attempt to see tomorrow around HD schedule to increase mobility) [name], [place]  Office [place] 11/14/2021, 2:32 PM

## 2021-11-14 NOTE — Inpatient Diabetes Management (Signed)
Inpatient Diabetes Program Recommendations  AACE/ADA: New Consensus Statement on Inpatient Glycemic Control   Target Ranges:  Prepandial:   less than 140 mg/dL      Peak postprandial:   less than 180 mg/dL (1-2 hours)      Critically ill patients:  140 - 180 mg/dL    Latest Reference Range & Units 11/13/21 12:27 11/13/21 16:08 11/13/21 20:22 11/14/21 07:32  Glucose-Capillary 70 - 99 mg/dL 154 (H) 269 (H) 197 (H) 227 (H)   Review of Glycemic Control  Diabetes history: DM2 Outpatient Diabetes medications: Lantus 10 units QHS, Novolog 10  units TID with meals Current orders for Inpatient glycemic control: Semglee 5 units QHS, Novolog 3 units TID with meals, Novolog 0-9 units TID with meals for meal coverage  Inpatient Diabetes Program Recommendations:    Insulin: Please consider increasing Semglee to 8 units QHS and meal coverage to Novolog 5 units TID with meals.  Thanks, Barnie Alderman, RN, MSN, [place] Coordinator Inpatient Diabetes Program [phone] (Team Pager from 8am to [place])

## 2021-11-14 NOTE — TOC Progression Note (Signed)
Transition of Care [facility]) - Initial/Assessment Note    Patient Details  Name: [name] MRN: [id number] Date of Birth: 1965/10/17  Transition of Care [facility]) CM/SW Contact:    Milinda Antis, [place] Phone Number: 11/14/2021, 10:17 AM  Clinical Narrative:                 Patient continues to endorse hip pain and inability to sit in a chair for dialysis.  TOC will continue to follow.    Expected Discharge Plan: Skilled Nursing Facility Barriers to Discharge: Continued Medical Work up   Patient Goals and CMS Choice Patient states their goals for this hospitalization and ongoing recovery are:: To return home CMS Medicare.gov Compare Post Acute Care list provided to:: Patient Choice offered to / list presented to : NA  Expected Discharge Plan and Services Expected Discharge Plan: [place]   Discharge Planning Services: CM Consult   Living arrangements for the past 2 months: Single Family Home                                      Prior Living Arrangements/Services Living arrangements for the past 2 months: Single Family Home Lives with:: Adult Children Patient language and need for interpreter reviewed:: Yes Do you feel safe going back to the place where you live?: Yes      Need for Family Participation in Patient Care: Yes (Comment) Care giver support system in place?: Yes (comment) Current home services: DME ([employer], w/c) Criminal Activity/Legal Involvement Pertinent to Current Situation/Hospitalization: No - Comment as needed  Activities of Daily Living Home Assistive Devices/Equipment: Eyeglasses, Wheelchair ADL Screening (condition at time of admission) Patient's cognitive ability adequate to safely complete daily activities?: Yes Is the patient deaf or have difficulty hearing?: No Does the patient have difficulty seeing, even when wearing glasses/contacts?: No Does the patient have difficulty concentrating, remembering, or making  decisions?: No Patient able to express need for assistance with ADLs?: Yes Does the patient have difficulty dressing or bathing?: Yes Independently performs ADLs?: No Communication: Independent Dressing (OT): Independent Is this a change from baseline?: Pre-admission baseline Grooming: Independent Feeding: Independent Bathing: Needs assistance Is this a change from baseline?: Pre-admission baseline Toileting: Needs assistance Is this a change from baseline?: Pre-admission baseline In/Out Bed: Needs assistance, Dependent Is this a change from baseline?: Pre-admission baseline Walks in Home: Dependent Is this a change from baseline?: Pre-admission baseline Does the patient have difficulty walking or climbing stairs?: Yes Weakness of Legs: Both Weakness of Arms/Hands: Both  Permission Sought/Granted Permission sought to share information with : Case Manager, Family Supports Permission granted to share information with : Yes, Verbal Permission Granted              Emotional Assessment Appearance:: Appears stated age Attitude/Demeanor/Rapport: Engaged, Gracious Affect (typically observed): Accepting, Appropriate, Calm, Hopeful Orientation: : Oriented to Self, Oriented to Place, Oriented to  Time, Oriented to Situation Alcohol / Substance Use: Not Applicable Psych Involvement: No (comment)  Admission diagnosis:  Wound infection [T14.8XXA, L08.9] Abscess of left hip [L02.416] Patient Active Problem List   Diagnosis Date Noted   Wound dehiscence, surgical, sequela    Abscess of left hip 08/26/2021   History of CVA (cerebrovascular accident) 40/97/3532   Hardware complicating wound infection ([place])    Subacute osteomyelitis of left femur with abscess     Septic arthritis of hip ([place]) 07/30/2021  Skin abscess L hip 07/28/2021   HCAP (healthcare-associated pneumonia) 07/27/2021   Hyperkalemia 62/70/3500   Metabolic acidemia 93/81/8299   Closed fracture of left distal femur  ([place]) 07/22/2021   Type 2 diabetes mellitus with hyperlipidemia ([place]) 07/22/2021   Infection of superficial incisional surgical site after procedure 03/09/2020   Abscess    Hypoglycemia    Essential hypertension    Sleep disturbance    ESRD (end stage renal disease) on dialysis ([place]) 01/24/2020   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[place]) 01/23/2020   Hyponatremia    Constipation    Chronic osteomyelitis involving left ankle and foot ([place])    Ulcer of left foot with necrosis of bone ([place])    Wound infection 12/28/2019   History of [place] amputation of left foot ([place]) 12/25/2017   History of CVA (cerebrovascular accident) without residual deficits 07/04/2017   Cerebral thrombosis with cerebral infarction 04/08/2017   Right sided weakness 04/07/2017   Hyperhidrosis 09/01/2016   Migraine with aura and without status migrainosus, not intractable 07/28/2016   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[place]) 08/04/2014   CKD stage 3 due to type 2 diabetes mellitus ([place]) 06/27/2014   Vitamin D insufficiency 05/08/2014   Obesity (BMI 30.0-34.9) 05/08/2014   Unilateral amputation of left foot ([place]) 05/08/2014   Bursitis of left shoulder 02/14/2014   Neck pain 01/03/2014   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.23) 01/14/2013   Insomnia 08/04/2012   Diabetic neuropathy, painful ([place]) 08/04/2011   Anxiety and depression 05/16/2010   Female stress incontinence 11/01/2007   TOBACCO DEPENDENCE 04/30/2006   GASTROESOPHAGEAL REFLUX, NO ESOPHAGITIS 04/30/2006   Irritable bowel syndrome 04/30/2006   PCP:  [name], MD Pharmacy:   [facility], [place] [address] [place] [address] Phone: [phone] Fax: [phone]  [id number] - [place], [place] [address] [place] [address] Phone: [phone] Fax:  [phone]     Social Determinants of Health (SDOH) Interventions    Readmission Risk Interventions    07/26/2021    3:15 PM  Readmission Risk Prevention Plan  Transportation Screening Complete  PCP or Specialist Appt within 3-5 Days Complete  HRI or Home Care Consult Complete  Palliative Care Screening Not Applicable  Medication Review (RN Care Manager) Referral to Pharmacy

## 2021-11-14 NOTE — Progress Notes (Signed)
[facility] Progress Note   Subjective:  Seen in room. No CP/dyspnea. Says had a tiny bit of cramping at end of HD yesterday. Ongoing L leg/hip pain.  Objective Vitals:   11/13/21 1605 11/13/21 2020 11/13/21 2026 11/14/21 0507  BP: (!) 142/60 (!) 127/52  (!) 147/53  Pulse: 66 70  62  Resp: 18 20  20   Temp: 97.7 F (36.5 C) 98.4 F (36.9 C)  98.3 F (36.8 C)  TempSrc: Oral Oral  Oral  SpO2: 97% 98%  97%  Weight:   91.6 kg   Height:       Physical Exam General: Well appearing woman, NAD. Room air. Heart: RRR; no murmur Lungs: CTAB; no rales or wheezing Abdomen: soft and non-tender Extremities: L BKA with stump edema; 1+ B thigh edema - some improvement Dialysis Access: L AVF + thrill  Additional Objective Labs: Basic Metabolic Panel: Recent Labs  Lab 11/11/21 1327 11/13/21 0721 11/14/21 0339  NA 128* 129* 130*  K 5.2* 4.8 4.6  CL 91* 94* 90*  CO2 26 26 28   GLUCOSE 159* 154* 166*  BUN 98* 74* 48*  CREATININE 6.90* 5.72* 4.52*  CALCIUM 8.8* 9.0 9.1  PHOS 5.6* 6.1* 4.6   Liver Function Tests: Recent Labs  Lab 11/11/21 1327 11/13/21 0721 11/14/21 0339  ALBUMIN 3.0* 3.0* 3.0*   CBC: Recent Labs  Lab 11/08/21 0740 11/11/21 1327 11/13/21 0721 11/14/21 0339  WBC 6.1 6.7 6.6 6.7  NEUTROABS  --   --   --  3.3  HGB 11.3* 11.0* 11.4* 11.7*  HCT 35.0* 34.0* 34.7* 36.2  MCV 86.8 86.3 86.3 85.2  PLT 186 201 195 196   Medications:  sodium chloride     sodium chloride 10 mL/hr at 09/19/21 1310   methocarbamol (ROBAXIN) IV      (feeding supplement) PROSource Plus  30 mL Oral BID BM   alosetron  1 mg Oral BID   amitriptyline  25 mg Oral QHS   amLODipine  10 mg Oral Q24H   atorvastatin  40 mg Oral q1800   carvedilol  6.25 mg Oral BID   cyclobenzaprine  10 mg Oral TID   docusate sodium  200 mg Oral BID   DULoxetine  60 mg Oral Daily   famotidine  20 mg Oral Daily   furosemide  80 mg Oral Daily   heparin  5,000 Units Subcutaneous Q8H    hydrALAZINE  25 mg Oral TID   insulin aspart  0-9 Units Subcutaneous TID WC   insulin aspart  3 Units Subcutaneous TID WC   insulin glargine-yfgn  5 Units Subcutaneous QHS   multivitamin  1 tablet Oral QHS   pantoprazole  80 mg Oral Daily   polyethylene glycol  17 g Oral Daily   sevelamer carbonate  2,400 mg Oral TID WC   sodium chloride flush  3 mL Intravenous Q12H   sodium zirconium cyclosilicate  10 g Oral Once per day on Sun Tue Thu Sat    Dialysis Orders: MWF at [facility], 400/500, EDW 90kg, 2K/2Ca, LUE AVF, heparin 4000 unit IV bolus - No ESA   Assessment/Plan: Recurrent L hip osteomyelitis: S/p 08/10/21 surgery (removal hip IMN which was placed 12/2019) with persistent VRE infection. Back to OR 08/28/21, 08/31/21 for L hip debridement and wound vac placement (now removed). Finished 6 week course of IV Daptomycin on 10/12/21. Management per primary team.  Chronic L hip/stump pain: She does have fractures of L femoral neck (where  hardware was removed) as well as distal femur Fx. Pain control per primary. ?May need to involve ortho again or consider palliative consult for pain management. ESRD: Continue HD on usual MWF - next tomorrow (9/15) Hyperkalemia: Recurrent issues, on 2K bath + furosemide. Lokelma 10g added on non-HD days - seems better. AVF dysfunction: S/p branch ligation in OR 09/19/2021 per Dr. Virl Cagey. Was having intermittent cannulation issues, now working well. HTN/ volume: BP controlled on amlodipine/hydralazine/Coreg. Edema still present  - reminded on fluid restrictions (exceeds). Getting ~4.5L UFG q HD. Anemia of ESRD: Hgb at goal, not on ESA.  Secondary HPTH: PTH/Ca/Phos ok. Not on VDRA. Nutrition: Alb low, continue protein supplements. DM2: Insulin per primary Dispo: SNF previously recommended, now she would like to go home on discharge. Dialyzed in recliner on 9/4 (although signed off HD early after 2.5hr) - try again when agreeable (refused on 9/6, 9/8, 9/11,  9/13).  Veneta Penton, PA-C 11/14/2021, 9:35 AM  [employer]

## 2021-11-14 NOTE — Progress Notes (Signed)
PROGRESS NOTE  [name] OEH:[id number] DOB: [birth year]   PCP: [name], MD   DOA: 08/26/2021 LOS: 62  Chief complaints Chief Complaint  Patient presents with   Wound Infection     Brief Narrative / Interim history: 56 y.o. female with a history of ESRD, anemia of CKD, diabetes mellitus type 2, hyperlipidemia, CVA, anxiety, depression, GERD, tobacco, left hip fracture s/p IM nail placement and recent hardware removal. Patient presented secondary to left hip bleeding and found to have evidence of subacute osteomyelitis and abscess of left femur. Orthopedic surgery/infectious disease consulted. Patient was started on empiric antibiotics and underwent multiple I&Ds. Patient completed a 6 week course of daptomycin. Discharge is pending ability for patient to tolerate HD in a chair which is complicated by patient's refusal/inability to sit in a chair secondary to pain.   Subjective: Seen and examined earlier this morning.  No major events overnight of this morning.  She said she had 10/10 pain in her left hip earlier.  Pain has improved to 8/10 now.  She also reports back pain from lying in bed.  Objective: Vitals:   11/13/21 2026 11/14/21 0507 11/14/21 1106 11/14/21 1109  BP:  (!) 147/53 (!) 138/58 (!) 138/58  Pulse:  62  66  Resp:  20  18  Temp:  98.3 F (36.8 C)  98 F (36.7 C)  TempSrc:  Oral  Oral  SpO2:  97%  96%  Weight: 91.6 kg     Height:        Examination:  GENERAL: No apparent distress.  Nontoxic. HEENT: MMM.  Vision and hearing grossly intact.  NECK: Supple.  No apparent JVD.  RESP:  No IWOB.  Fair aeration bilaterally. CVS:  RRR. Heart sounds normal.  ABD/GI/GU: BS+. Abd soft, NTND.  MSK/EXT:  Moves extremities.  Left BKA. SKIN: Dressing over left hip DCI. NEURO: Awake and alert. Oriented appropriately.  No apparent focal neuro deficit. PSYCH: Calm. Normal affect.    Assessment and plan: Principal Problem:   Subacute osteomyelitis of left femur with  abscess  Active Problems:   ESRD (end stage renal disease) on dialysis ([place])   Type 2 diabetes mellitus with hyperlipidemia (HCC)   History of CVA (cerebrovascular accident)   Essential hypertension   Anxiety and depression   GASTROESOPHAGEAL REFLUX, NO ESOPHAGITIS   TOBACCO DEPENDENCE   Hyponatremia   Obesity (BMI 30.0-34.9)   Unilateral amputation of left foot (H[facility])   Wound infection   History of CVA (cerebrovascular accident) without residual deficits   Hyperkalemia   Wound dehiscence, surgical, sequela  ESRD on HD MWF/anasarca -Per nephrology. -Has to do HD in chair for outpatient HD.   Subacute osteomyelitis of left femur with abscess/infected left hip hardware s/p removal on 6/10 prior to admission with I&D on 6/28 and 7/1 -Completed 6 weeks of appropriate antibiotics per ID recommendation.   -9/2-CT hip with fluid collection and soft tissue thickening.  Ortho recommended outpatient f/up with Dr. [name] 1 wk after discharge.  Low suspicion for persistent infection. -Pain control   History of left intertrochanteric fracture: previously managed with IM nail placement in 2021. Hardware removed secondary to above.   Hyponatremia/Hypokalemia/Hyperkalemia/hypomagnesemia -Management with hemodialysis   ABLA superimposed on anemia of chronic disease: Stable. Recent Labs    10/23/21 0556 10/25/21 0800 10/30/21 1347 11/01/21 0700 11/04/21 1437 11/06/21 1001 11/08/21 0740 11/11/21 1327 11/13/21 0721 11/14/21 0339  HGB 12.0 12.2 11.5* 11.4* 11.5* 10.8* 11.3* 11.0* 11.4* 11.7*  -Monitor intermittently  Controlled IDDM-2 with hyperglycemia and ESRD: A1c 5.9%. Recent Labs  Lab 11/13/21 1608 11/13/21 2022 11/14/21 0732 11/14/21 1145 11/14/21 1643  GLUCAP 269* 197* 227* 190* 175*  -Increase SSI to sensitive -Recheck A1c   Primary hypertension -Continue amlodipine, hydralazine and Coreg   History of CVA/HLD -Continue Lipitor   GERD -Continue Pepcid and Protonix    Tobacco use -Tobacco cessation discussed during this admission.  Anxiety and depression: -Continue home meds  Body mass index is 28.98 kg/m.          DVT prophylaxis:  Place TED hose Start: 09/06/21 1507 SCDs Start: 08/31/21 1058 heparin injection 5,000 Units Start: 08/26/21 1615  Code Status: Nephrology Family Communication: None at bedside Level of care: Med-Surg Status is: Inpatient Remains inpatient appropriate because: Lack of safe disposition and not tolerating HD in chair.   Final disposition: SNF Consultants:  Nephrology Orthopedic surgery   Sch Meds:  Scheduled Meds:  (feeding supplement) PROSource Plus  30 mL Oral BID BM   alosetron  1 mg Oral BID   amitriptyline  25 mg Oral QHS   amLODipine  10 mg Oral Q24H   atorvastatin  40 mg Oral q1800   carvedilol  6.25 mg Oral BID   cyclobenzaprine  10 mg Oral TID   docusate sodium  200 mg Oral BID   DULoxetine  60 mg Oral Daily   famotidine  20 mg Oral Daily   furosemide  80 mg Oral Daily   heparin  5,000 Units Subcutaneous Q8H   hydrALAZINE  25 mg Oral TID   insulin aspart  0-9 Units Subcutaneous TID WC   insulin aspart  3 Units Subcutaneous TID WC   insulin glargine-yfgn  5 Units Subcutaneous QHS   multivitamin  1 tablet Oral QHS   pantoprazole  80 mg Oral Daily   polyethylene glycol  17 g Oral Daily   sevelamer carbonate  2,400 mg Oral TID WC   sodium chloride flush  3 mL Intravenous Q12H   sodium zirconium cyclosilicate  10 g Oral Once per day on Sun Tue Thu Sat   Continuous Infusions:  sodium chloride     sodium chloride 10 mL/hr at 09/19/21 1310   methocarbamol (ROBAXIN) IV     PRN Meds:.sodium chloride, acetaminophen, bisacodyl, calcium carbonate, methocarbamol **OR** methocarbamol (ROBAXIN) IV, ondansetron **OR** ondansetron (ZOFRAN) IV, mouth rinse, oxyCODONE, oxyCODONE, prochlorperazine, sodium chloride flush  Antimicrobials: Anti-infectives (From admission, onward)    Start     Dose/Rate  Route Frequency Ordered Stop   10/02/21 2000  DAPTOmycin (CUBICIN) 900 mg in sodium chloride 0.9 % IVPB  Status:  Discontinued        10 mg/kg  92.4 kg 136 mL/hr over 30 Minutes Intravenous Once per day on Mon Wed Fri 10/01/21 1350 10/12/21 1146   09/30/21 2000  DAPTOmycin (CUBICIN) 900 mg in sodium chloride 0.9 % IVPB  Status:  Discontinued        10 mg/kg  92.4 kg 136 mL/hr over 30 Minutes Intravenous Daily 09/28/21 0919 10/01/21 1350   09/28/21 2000  DAPTOmycin (CUBICIN) 900 mg in sodium chloride 0.9 % IVPB        900 mg 136 mL/hr over 30 Minutes Intravenous  Once 09/28/21 0919 09/28/21 2146   09/02/21 2000  DAPTOmycin (CUBICIN) 900 mg in sodium chloride 0.9 % IVPB  Status:  Discontinued        10 mg/kg  92.4 kg 136 mL/hr over 30 Minutes Intravenous Once per day on Mon Wed  Fri 09/02/21 0836 09/28/21 0919   08/31/21 0745  ceFAZolin (ANCEF) IVPB 2g/100 mL premix        2 g 200 mL/hr over 30 Minutes Intravenous To Short Stay 08/31/21 0730 08/31/21 0918   08/30/21 0600  ceFAZolin (ANCEF) IVPB 2g/100 mL premix  Status:  Discontinued        2 g 200 mL/hr over 30 Minutes Intravenous To Short Stay 08/30/21 0123 08/31/21 0600   08/29/21 2000  DAPTOmycin (CUBICIN) 900 mg in sodium chloride 0.9 % IVPB  Status:  Discontinued        10 mg/kg  92.4 kg 136 mL/hr over 30 Minutes Intravenous Every 48 hours 08/29/21 1627 09/02/21 0836   08/28/21 1800  ceFAZolin (ANCEF) IVPB 2g/100 mL premix  Status:  Discontinued        2 g 200 mL/hr over 30 Minutes Intravenous Every M-W-F (1800) 08/27/21 0846 08/29/21 1620   08/28/21 1200  ceFAZolin (ANCEF) IVPB 1 g/50 mL premix  Status:  Discontinued        1 g 100 mL/hr over 30 Minutes Intravenous Every M-W-F (Hemodialysis) 08/26/21 1954 08/27/21 0846   08/28/21 0600  ceFAZolin (ANCEF) IVPB 2g/100 mL premix        2 g 200 mL/hr over 30 Minutes Intravenous On call to O.R. 08/27/21 1150 08/28/21 0909   08/27/21 1800  ceFAZolin (ANCEF) IVPB 1 g/50 mL premix         1 g 100 mL/hr over 30 Minutes Intravenous  Once 08/26/21 1954 08/27/21 2048   08/26/21 1500  ceFEPIme (MAXIPIME) 1 g in sodium chloride 0.9 % 100 mL IVPB  Status:  Discontinued        1 g 200 mL/hr over 30 Minutes Intravenous Every 24 hours 08/26/21 1452 08/26/21 1714        I have personally reviewed the following labs and images: CBC: Recent Labs  Lab 11/08/21 0740 11/11/21 1327 11/13/21 0721 11/14/21 0339  WBC 6.1 6.7 6.6 6.7  NEUTROABS  --   --   --  3.3  HGB 11.3* 11.0* 11.4* 11.7*  HCT 35.0* 34.0* 34.7* 36.2  MCV 86.8 86.3 86.3 85.2  PLT 186 201 195 196   BMP &GFR Recent Labs  Lab 11/08/21 0741 11/10/21 0303 11/11/21 1327 11/13/21 0721 11/14/21 0339  NA 130* 131* 128* 129* 130*  K 4.9 4.8 5.2* 4.8 4.6  CL 91* 93* 91* 94* 90*  CO2 27 29 26 26 28   GLUCOSE 140* 150* 159* 154* 166*  BUN 73* 71* 98* 74* 48*  CREATININE 5.93* 5.55* 6.90* 5.72* 4.52*  CALCIUM 9.2 9.1 8.8* 9.0 9.1  MG  --   --   --   --  1.6*  PHOS 5.3* 5.3* 5.6* 6.1* 4.6   Estimated Creatinine Clearance: 17.5 mL/min (A) (by C-G formula based on SCr of 4.52 mg/dL (H)). Liver & Pancreas: Recent Labs  Lab 11/08/21 0741 11/10/21 0303 11/11/21 1327 11/13/21 0721 11/14/21 0339  ALBUMIN 2.9* 3.0* 3.0* 3.0* 3.0*   No results for input(s): "LIPASE", "AMYLASE" in the last 168 hours. No results for input(s): "AMMONIA" in the last 168 hours. Diabetic: Recent Labs    11/14/21 0339  HGBA1C 5.9*   Recent Labs  Lab 11/13/21 1608 11/13/21 2022 11/14/21 0732 11/14/21 1145 11/14/21 1643  GLUCAP 269* 197* 227* 190* 175*   Cardiac Enzymes: No results for input(s): "CKTOTAL", "CKMB", "CKMBINDEX", "TROPONINI" in the last 168 hours. No results for input(s): "PROBNP" in the last 8760 hours. Coagulation Profile:  No results for input(s): "INR", "PROTIME" in the last 168 hours. Thyroid Function Tests: No results for input(s): "TSH", "T4TOTAL", "FREET4", "T3FREE", "THYROIDAB" in the last 72  hours. Lipid Profile: No results for input(s): "CHOL", "HDL", "LDLCALC", "TRIG", "CHOLHDL", "LDLDIRECT" in the last 72 hours. Anemia Panel: No results for input(s): "VITAMINB12", "FOLATE", "FERRITIN", "TIBC", "IRON", "RETICCTPCT" in the last 72 hours. Urine analysis:    Component Value Date/Time   COLORURINE YELLOW (A) 08/02/2021 1750   APPEARANCEUR HAZY (A) 08/02/2021 1750   LABSPEC 1.014 08/02/2021 1750   PHURINE 5.0 08/02/2021 1750   GLUCOSEU 50 (A) 08/02/2021 1750   GLUCOSEU NEGATIVE 03/29/2014 1259   HGBUR NEGATIVE 08/02/2021 1750   BILIRUBINUR NEGATIVE 08/02/2021 1750   BILIRUBINUR neg 02/16/2017 1501   KETONESUR NEGATIVE 08/02/2021 1750   PROTEINUR 100 (A) 08/02/2021 1750   UROBILINOGEN 0.2 02/16/2017 1501   UROBILINOGEN 0.2 03/29/2014 1259   NITRITE NEGATIVE 08/02/2021 1750   LEUKOCYTESUR TRACE (A) 08/02/2021 1750   Sepsis Labs: Invalid input(s): "PROCALCITONIN", "LACTICIDVEN"  Microbiology: No results found for this or any previous visit (from the past 240 hour(s)).  Radiology Studies: No results found.    Obbie Lewallen T. Lester  If 7PM-7AM, please contact night-coverage [email or web address] 11/14/2021, 7:15 PM

## 2021-11-14 NOTE — Plan of Care (Signed)
  Problem: Pain Managment: Goal: General experience of comfort will improve Outcome: Progressing   Problem: Nutritional: Goal: Ability to make healthy dietary choices will improve Outcome: Progressing

## 2021-11-14 NOTE — Progress Notes (Signed)
OT Cancellation Note  Patient Details Name: [name] MRN: [id number] DOB: 27-Jan-1966   Cancelled Treatment:    Reason Eval/Treat Not Completed: Patient declined, no reason specified (Patient stating she has a migrane and doesn't think she can get up. Nursing is addressing and OT will attempt again later as schedule permits.) [name], [place]  Office [place] 11/14/2021, 12:56 PM

## 2021-11-15 DIAGNOSIS — N186 End stage renal disease: Secondary | ICD-10-CM | POA: Diagnosis not present

## 2021-11-15 DIAGNOSIS — E1169 Type 2 diabetes mellitus with other specified complication: Secondary | ICD-10-CM | POA: Diagnosis not present

## 2021-11-15 DIAGNOSIS — M86252 Subacute osteomyelitis, left femur: Secondary | ICD-10-CM | POA: Diagnosis not present

## 2021-11-15 DIAGNOSIS — Z8673 Personal history of transient ischemic attack (TIA), and cerebral infarction without residual deficits: Secondary | ICD-10-CM | POA: Diagnosis not present

## 2021-11-15 LAB — GLUCOSE, CAPILLARY
Glucose-Capillary: 143 mg/dL — ABNORMAL HIGH (ref 70–99)
Glucose-Capillary: 179 mg/dL — ABNORMAL HIGH (ref 70–99)
Glucose-Capillary: 151 mg/dL — ABNORMAL HIGH (ref 70–99)

## 2021-11-15 MED ORDER — HEPARIN SODIUM (PORCINE) 1000 UNIT/ML IJ SOLN
INTRAMUSCULAR | Status: AC
  Filled 2021-11-15: qty 2

## 2021-11-15 MED ORDER — HEPARIN SODIUM (PORCINE) 1000 UNIT/ML DIALYSIS
20.0000 [IU]/kg | Freq: Once | INTRAMUSCULAR | Status: AC
  Administered 2021-11-15: 1800 [IU] via INTRAVENOUS_CENTRAL
  Filled 2021-11-15: qty 2

## 2021-11-15 NOTE — Procedures (Signed)
I was present at this dialysis session. I have reviewed the session itself and made appropriate changes.   On 2K bath initial UF goal of 5L.  Using AVF, working well.  Hb has been stable.  Unfortunately is doing HD in the bed this morning.    Filed Weights   11/13/21 1150 11/13/21 2026 11/15/21 0745  Weight: 89.7 kg 91.6 kg 94.6 kg    Recent Labs  Lab 11/14/21 0339  NA 130*  K 4.6  CL 90*  CO2 28  GLUCOSE 166*  BUN 48*  CREATININE 4.52*  CALCIUM 9.1  PHOS 4.6    Recent Labs  Lab 11/11/21 1327 11/13/21 0721 11/14/21 0339  WBC 6.7 6.6 6.7  NEUTROABS  --   --  3.3  HGB 11.0* 11.4* 11.7*  HCT 34.0* 34.7* 36.2  MCV 86.3 86.3 85.2  PLT 201 195 196    Scheduled Meds:  (feeding supplement) PROSource Plus  30 mL Oral BID BM   alosetron  1 mg Oral BID   amitriptyline  25 mg Oral QHS   amLODipine  10 mg Oral Q24H   atorvastatin  40 mg Oral q1800   carvedilol  6.25 mg Oral BID   cyclobenzaprine  10 mg Oral TID   docusate sodium  200 mg Oral BID   DULoxetine  60 mg Oral Daily   famotidine  20 mg Oral Daily   furosemide  80 mg Oral Daily   heparin  5,000 Units Subcutaneous Q8H   hydrALAZINE  25 mg Oral TID   insulin aspart  0-9 Units Subcutaneous TID WC   insulin aspart  3 Units Subcutaneous TID WC   insulin glargine-yfgn  5 Units Subcutaneous QHS   multivitamin  1 tablet Oral QHS   pantoprazole  80 mg Oral Daily   polyethylene glycol  17 g Oral Daily   sevelamer carbonate  2,400 mg Oral TID WC   sodium chloride flush  3 mL Intravenous Q12H   sodium zirconium cyclosilicate  10 g Oral Once per day on Sun Tue Thu Sat   Continuous Infusions:  sodium chloride     sodium chloride 10 mL/hr at 09/19/21 1310   methocarbamol (ROBAXIN) IV     PRN Meds:.sodium chloride, acetaminophen, bisacodyl, calcium carbonate, methocarbamol **OR** methocarbamol (ROBAXIN) IV, ondansetron **OR** ondansetron (ZOFRAN) IV, mouth rinse, oxyCODONE, oxyCODONE, prochlorperazine, sodium chloride  flush   Pearson Grippe  MD 11/15/2021, 8:19 AM

## 2021-11-15 NOTE — Progress Notes (Signed)
PT Cancellation Note  Patient Details Name: STEFANA LODICO MRN: [id number] DOB: February 10, 1966   Cancelled Treatment:    Reason Eval/Treat Not Completed: (P) Patient at procedure or test/unavailable Pt off the floor for HD. PT will follow back for treatment this afternoon as able.  Allina Riches B. [name] PT, DPT Acute Rehabilitation Services Please use secure chat or  Call Office [phone]    [place] 11/15/2021, 8:23 AM

## 2021-11-15 NOTE — Progress Notes (Signed)
PT Cancellation Note  Patient Details Name: [name] MRN: [id number] DOB: October 14, 1965   Cancelled Treatment:    Reason Eval/Treat Not Completed: (P) Pain limiting ability to participate Pt returned from HD and reports she is too painful to participate in therapy today.  [name] B. [name] PT, DPT Acute Rehabilitation Services Please use secure chat or  Call Office [phone]   [place] 11/15/2021, 1:41 PM

## 2021-11-15 NOTE — Progress Notes (Signed)
OT Cancellation Note  Patient Details Name: [name] MRN: [id number] DOB: 08/07/1965   Cancelled Treatment:    Reason Eval/Treat Not Completed: Patient declined, no reason specified (Patient stated she was in too much pain to participate with therapy today.) [name], [place]  Office [place] 11/15/2021, 1:41 PM

## 2021-11-15 NOTE — Inpatient Diabetes Management (Signed)
Inpatient Diabetes Program Recommendations  AACE/ADA: New Consensus Statement on Inpatient Glycemic Control  Target Ranges:  Prepandial:   less than 140 mg/dL      Peak postprandial:   less than 180 mg/dL (1-2 hours)      Critically ill patients:  140 - 180 mg/dL    Latest Reference Range & Units 11/14/21 07:32 11/14/21 11:45 11/14/21 16:43 11/14/21 20:43  Glucose-Capillary 70 - 99 mg/dL 227 (H)  Novolog 5 units 190 (H)  Novolog 5 units 175 (H)  Novolog 5 units 199 (H)     Semglee 5 units    Review of Glycemic Control   Diabetes history: DM2 Outpatient Diabetes medications: Lantus 10 units QHS, Novolog 10  units TID with meals Current orders for Inpatient glycemic control: Semglee 5 units QHS, Novolog 3 units TID with meals, Novolog 0-9 units TID with meals for meal coverage   Inpatient Diabetes Program Recommendations:     Insulin: Please consider increasing Semglee to 8 units QHS and meal coverage to Novolog 4 units TID with meals.   Thanks, Barnie Alderman, RN, MSN, [place] Diabetes Coordinator Inpatient Diabetes Program [phone] (Team Pager from 8am to [place])

## 2021-11-15 NOTE — Progress Notes (Signed)
PROGRESS NOTE  [name]:[id number] DOB: 07-Dec-1965   PCP: [name], MD   DOA: 08/26/2021 LOS: 25  Chief complaints Chief Complaint  Patient presents with   Wound Infection     Brief Narrative / Interim history: 56 y.o. female with a history of ESRD, anemia of CKD, diabetes mellitus type 2, hyperlipidemia, CVA, anxiety, depression, GERD, tobacco, left hip fracture s/p IM nail placement and recent hardware removal. Patient presented secondary to left hip bleeding and found to have evidence of subacute osteomyelitis and abscess of left femur. Orthopedic surgery/infectious disease consulted. Patient was started on empiric antibiotics and underwent multiple I&Ds. Patient completed a 6 week course of daptomycin.  Prolonged hospitalization due to patient's refusal/inability to do HD in chair.  Subjective: Seen and examined earlier this morning while on dialysis.  No major events overnight of this morning.  Continues to endorse left hip pain.  She rates her pain 7/10 although she was sleeping when I walked to bedside.  She says she is due for her pain medication.  No other concern or complaints.  Objective: Vitals:   11/15/21 1200 11/15/21 1230 11/15/21 1241 11/15/21 1259  BP: 110/69 115/60  124/64  Pulse: 70 68  67  Resp: 18 19  19   Temp:  97.9 F (36.6 C)  98 F (36.7 C)  TempSrc:  Oral    SpO2: 100% 100%  96%  Weight:   89.2 kg   Height:        Examination:  GENERAL: No apparent distress.  Nontoxic. HEENT: MMM.  Vision and hearing grossly intact.  NECK: Supple.  No apparent JVD.  RESP:  No IWOB.  Fair aeration bilaterally. CVS:  RRR. Heart sounds normal.  ABD/GI/GU: BS+. Abd soft, NTND.  MSK/EXT:  Moves extremities.  Left BKA. SKIN: no apparent skin lesion or wound NEURO: Sleepy but wakes to voice.  Oriented.  No apparent focal neuro deficit. PSYCH: Calm. Normal affect.    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 -Has to do HD in chair for outpatient HD.   Subacute osteomyelitis of left femur with abscess/infected left hip hardware s/p removal on 6/10 prior to admission with I&D on 6/28 and 7/1 -Completed 6 weeks of appropriate antibiotics per ID recommendation.   -CT hip on 9/2 with fluid collection and soft tissue thickening.   -Ortho rec-outpatient f/up with Dr. [name] 1 wk after discharge.  Low suspicion for persistent infection. -Pain control   History of left intertrochanteric fracture: previously managed with IM nail placement in 2021. Hardware removed secondary to above.   Hyponatremia/Hypokalemia/Hyperkalemia/hypomagnesemia -Management with hemodialysis   ABLA superimposed on anemia of chronic disease: Stable. Recent Labs    10/23/21 0556 10/25/21 0800 10/30/21 1347 11/01/21 0700 11/04/21 1437 11/06/21 1001 11/08/21 0740 11/11/21 1327 11/13/21 0721 11/14/21 0339  HGB 12.0 12.2 11.5* 11.4* 11.5* 10.8* 11.3* 11.0* 11.4* 11.7*  -Monitor intermittently  Controlled IDDM-2 with hyperglycemia and ESRD: A1c 5.9%. Recent Labs  Lab 11/14/21 0732 11/14/21 1145 11/14/21 1643 11/14/21 2043 11/15/21 1257  GLUCAP 227* 190* 175* 199* 143*  -Continue SSI-sensitive    Primary hypertension -Continue amlodipine, hydralazine and Coreg   History of CVA/HLD -Continue Lipitor   GERD -Continue Pepcid and Protonix   Tobacco  use -Tobacco cessation discussed during this admission.  Anxiety and depression: -Continue home meds  Body mass index is 28.22 kg/m.          DVT prophylaxis:  Place TED hose Start: 09/06/21 1507 SCDs Start: 08/31/21 1058 heparin injection 5,000 Units Start: 08/26/21 1615  Code Status: Nephrology Family Communication: None at bedside Level of care: Med-Surg Status is: Inpatient Remains inpatient appropriate because: Lack of safe disposition and not tolerating HD in chair.   Final disposition: SNF Consultants:  Nephrology Orthopedic surgery   Sch Meds:  Scheduled Meds:  (feeding supplement) PROSource Plus  30 mL Oral BID BM   alosetron  1 mg Oral BID   amitriptyline  25 mg Oral QHS   amLODipine  10 mg Oral Q24H   atorvastatin  40 mg Oral q1800   carvedilol  6.25 mg Oral BID   cyclobenzaprine  10 mg Oral TID   docusate sodium  200 mg Oral BID   DULoxetine  60 mg Oral Daily   famotidine  20 mg Oral Daily   furosemide  80 mg Oral Daily   heparin  5,000 Units Subcutaneous Q8H   hydrALAZINE  25 mg Oral TID   insulin aspart  0-9 Units Subcutaneous TID WC   insulin aspart  3 Units Subcutaneous TID WC   insulin glargine-yfgn  5 Units Subcutaneous QHS   multivitamin  1 tablet Oral QHS   pantoprazole  80 mg Oral Daily   polyethylene glycol  17 g Oral Daily   sevelamer carbonate  2,400 mg Oral TID WC   sodium chloride flush  3 mL Intravenous Q12H   sodium zirconium cyclosilicate  10 g Oral Once per day on Sun Tue Thu Sat   Continuous Infusions:  sodium chloride     sodium chloride 10 mL/hr at 09/19/21 1310   methocarbamol (ROBAXIN) IV     PRN Meds:.sodium chloride, acetaminophen, bisacodyl, calcium carbonate, methocarbamol **OR** methocarbamol (ROBAXIN) IV, ondansetron **OR** ondansetron (ZOFRAN) IV, mouth rinse, oxyCODONE, oxyCODONE, prochlorperazine, sodium chloride flush  Antimicrobials: Anti-infectives (From admission, onward)    Start     Dose/Rate Route  Frequency Ordered Stop   10/02/21 2000  DAPTOmycin (CUBICIN) 900 mg in sodium chloride 0.9 % IVPB  Status:  Discontinued        10 mg/kg  92.4 kg 136 mL/hr over 30 Minutes Intravenous Once per day on Mon Wed Fri 10/01/21 1350 10/12/21 1146   09/30/21 2000  DAPTOmycin (CUBICIN) 900 mg in sodium chloride 0.9 % IVPB  Status:  Discontinued        10 mg/kg  92.4 kg 136 mL/hr over 30 Minutes Intravenous Daily 09/28/21 0919 10/01/21 1350   09/28/21 2000  DAPTOmycin (CUBICIN) 900 mg in sodium chloride 0.9 % IVPB        900 mg 136 mL/hr over 30 Minutes Intravenous  Once 09/28/21 0919 09/28/21 2146   09/02/21 2000  DAPTOmycin (CUBICIN) 900 mg in sodium chloride 0.9 % IVPB  Status:  Discontinued        10 mg/kg  92.4 kg 136 mL/hr over 30 Minutes Intravenous Once per day on Mon Wed Fri 09/02/21 4627  09/28/21 0919   08/31/21 0745  ceFAZolin (ANCEF) IVPB 2g/100 mL premix        2 g 200 mL/hr over 30 Minutes Intravenous To Short Stay 08/31/21 0730 08/31/21 0918   08/30/21 0600  ceFAZolin (ANCEF) IVPB 2g/100 mL premix  Status:  Discontinued        2 g 200 mL/hr over 30 Minutes Intravenous To Short Stay 08/30/21 0123 08/31/21 0600   08/29/21 2000  DAPTOmycin (CUBICIN) 900 mg in sodium chloride 0.9 % IVPB  Status:  Discontinued        10 mg/kg  92.4 kg 136 mL/hr over 30 Minutes Intravenous Every 48 hours 08/29/21 1627 09/02/21 0836   08/28/21 1800  ceFAZolin (ANCEF) IVPB 2g/100 mL premix  Status:  Discontinued        2 g 200 mL/hr over 30 Minutes Intravenous Every M-W-F (1800) 08/27/21 0846 08/29/21 1620   08/28/21 1200  ceFAZolin (ANCEF) IVPB 1 g/50 mL premix  Status:  Discontinued        1 g 100 mL/hr over 30 Minutes Intravenous Every M-W-F (Hemodialysis) 08/26/21 1954 08/27/21 0846   08/28/21 0600  ceFAZolin (ANCEF) IVPB 2g/100 mL premix        2 g 200 mL/hr over 30 Minutes Intravenous On call to O.R. 08/27/21 1150 08/28/21 0909   08/27/21 1800  ceFAZolin (ANCEF) IVPB 1 g/50 mL premix        1  g 100 mL/hr over 30 Minutes Intravenous  Once 08/26/21 1954 08/27/21 2048   08/26/21 1500  ceFEPIme (MAXIPIME) 1 g in sodium chloride 0.9 % 100 mL IVPB  Status:  Discontinued        1 g 200 mL/hr over 30 Minutes Intravenous Every 24 hours 08/26/21 1452 08/26/21 1714        I have personally reviewed the following labs and images: CBC: Recent Labs  Lab 11/11/21 1327 11/13/21 0721 11/14/21 0339  WBC 6.7 6.6 6.7  NEUTROABS  --   --  3.3  HGB 11.0* 11.4* 11.7*  HCT 34.0* 34.7* 36.2  MCV 86.3 86.3 85.2  PLT 201 195 196   BMP &GFR Recent Labs  Lab 11/10/21 0303 11/11/21 1327 11/13/21 0721 11/14/21 0339  NA 131* 128* 129* 130*  K 4.8 5.2* 4.8 4.6  CL 93* 91* 94* 90*  CO2 29 26 26 28   GLUCOSE 150* 159* 154* 166*  BUN 71* 98* 74* 48*  CREATININE 5.55* 6.90* 5.72* 4.52*  CALCIUM 9.1 8.8* 9.0 9.1  MG  --   --   --  1.6*  PHOS 5.3* 5.6* 6.1* 4.6   Estimated Creatinine Clearance: 17.3 mL/min (A) (by C-G formula based on SCr of 4.52 mg/dL (H)). Liver & Pancreas: Recent Labs  Lab 11/10/21 0303 11/11/21 1327 11/13/21 0721 11/14/21 0339  ALBUMIN 3.0* 3.0* 3.0* 3.0*   No results for input(s): "LIPASE", "AMYLASE" in the last 168 hours. No results for input(s): "AMMONIA" in the last 168 hours. Diabetic: Recent Labs    11/14/21 0339  HGBA1C 5.9*   Recent Labs  Lab 11/14/21 0732 11/14/21 1145 11/14/21 1643 11/14/21 2043 11/15/21 1257  GLUCAP 227* 190* 175* 199* 143*   Cardiac Enzymes: No results for input(s): "CKTOTAL", "CKMB", "CKMBINDEX", "TROPONINI" in the last 168 hours. No results for input(s): "PROBNP" in the last 8760 hours. Coagulation Profile: No results for input(s): "INR", "PROTIME" in the last 168 hours. Thyroid Function Tests: No results for input(s): "TSH", "T4TOTAL", "FREET4", "T3FREE", "THYROIDAB" in the last 72 hours. Lipid Profile:  No results for input(s): "CHOL", "HDL", "LDLCALC", "TRIG", "CHOLHDL", "LDLDIRECT" in the last 72 hours. Anemia  Panel: No results for input(s): "VITAMINB12", "FOLATE", "FERRITIN", "TIBC", "IRON", "RETICCTPCT" in the last 72 hours. Urine analysis:    Component Value Date/Time   COLORURINE YELLOW (A) 08/02/2021 1750   APPEARANCEUR HAZY (A) 08/02/2021 1750   LABSPEC 1.014 08/02/2021 1750   PHURINE 5.0 08/02/2021 1750   GLUCOSEU 50 (A) 08/02/2021 1750   GLUCOSEU NEGATIVE 03/29/2014 1259   HGBUR NEGATIVE 08/02/2021 1750   BILIRUBINUR NEGATIVE 08/02/2021 1750   BILIRUBINUR neg 02/16/2017 1501   KETONESUR NEGATIVE 08/02/2021 1750   PROTEINUR 100 (A) 08/02/2021 1750   UROBILINOGEN 0.2 02/16/2017 1501   UROBILINOGEN 0.2 03/29/2014 1259   NITRITE NEGATIVE 08/02/2021 1750   LEUKOCYTESUR TRACE (A) 08/02/2021 1750   Sepsis Labs: Invalid input(s): "PROCALCITONIN", "LACTICIDVEN"  Microbiology: No results found for this or any previous visit (from the past 240 hour(s)).  Radiology Studies: No results found.    [name] T. Stanton  If 7PM-7AM, please contact night-coverage [email or web address] 11/15/2021, 2:27 PM

## 2021-11-16 DIAGNOSIS — Z8673 Personal history of transient ischemic attack (TIA), and cerebral infarction without residual deficits: Secondary | ICD-10-CM | POA: Diagnosis not present

## 2021-11-16 DIAGNOSIS — E1169 Type 2 diabetes mellitus with other specified complication: Secondary | ICD-10-CM | POA: Diagnosis not present

## 2021-11-16 DIAGNOSIS — N186 End stage renal disease: Secondary | ICD-10-CM | POA: Diagnosis not present

## 2021-11-16 DIAGNOSIS — M86252 Subacute osteomyelitis, left femur: Secondary | ICD-10-CM | POA: Diagnosis not present

## 2021-11-16 LAB — GLUCOSE, CAPILLARY
Glucose-Capillary: 253 mg/dL — ABNORMAL HIGH (ref 70–99)
Glucose-Capillary: 187 mg/dL — ABNORMAL HIGH (ref 70–99)
Glucose-Capillary: 147 mg/dL — ABNORMAL HIGH (ref 70–99)
Glucose-Capillary: 179 mg/dL — ABNORMAL HIGH (ref 70–99)

## 2021-11-16 MED ORDER — INSULIN ASPART 100 UNIT/ML IJ SOLN
4.0000 [IU] | Freq: Three times a day (TID) | INTRAMUSCULAR | Status: DC
  Administered 2021-11-16 – 2022-01-22 (×146): 4 [IU] via SUBCUTANEOUS

## 2021-11-16 NOTE — Progress Notes (Signed)
[facility] Progress Note   Subjective:  Seen in room. No issues with dialysis yesterday. Tolerated 4.9L UF. Still endorsing pain in hip. Didn't feel ready for recliner.   Objective Vitals:   11/15/21 1631 11/15/21 2057 11/16/21 0529 11/16/21 0852  BP: (!) 118/54 (!) 125/59 (!) 130/58 (!) 132/58  Pulse: 70 70 (!) 58 64  Resp: 16 15 18 18   Temp: 98.9 F (37.2 C) 98.7 F (37.1 C) 98.2 F (36.8 C) 98.3 F (36.8 C)  TempSrc: Oral  Oral Oral  SpO2: 97% 98% 100% 100%  Weight:      Height:       Physical Exam General: Well appearing woman, NAD. Room air. Heart: RRR; no murmur Lungs: CTAB; no rales or wheezing Abdomen: soft and non-tender Extremities: L BKA with stump edema; 1+ B thigh edema - some improvement Dialysis Access: L AVF + thrill  Additional Objective Labs: Basic Metabolic Panel: Recent Labs  Lab 11/11/21 1327 11/13/21 0721 11/14/21 0339  NA 128* 129* 130*  K 5.2* 4.8 4.6  CL 91* 94* 90*  CO2 26 26 28   GLUCOSE 159* 154* 166*  BUN 98* 74* 48*  CREATININE 6.90* 5.72* 4.52*  CALCIUM 8.8* 9.0 9.1  PHOS 5.6* 6.1* 4.6    Liver Function Tests: Recent Labs  Lab 11/11/21 1327 11/13/21 0721 11/14/21 0339  ALBUMIN 3.0* 3.0* 3.0*    CBC: Recent Labs  Lab 11/11/21 1327 11/13/21 0721 11/14/21 0339  WBC 6.7 6.6 6.7  NEUTROABS  --   --  3.3  HGB 11.0* 11.4* 11.7*  HCT 34.0* 34.7* 36.2  MCV 86.3 86.3 85.2  PLT 201 195 196    Medications:  sodium chloride     sodium chloride 10 mL/hr at 09/19/21 1310   methocarbamol (ROBAXIN) IV      (feeding supplement) PROSource Plus  30 mL Oral BID BM   alosetron  1 mg Oral BID   amitriptyline  25 mg Oral QHS   amLODipine  10 mg Oral Q24H   atorvastatin  40 mg Oral q1800   carvedilol  6.25 mg Oral BID   cyclobenzaprine  10 mg Oral TID   docusate sodium  200 mg Oral BID   DULoxetine  60 mg Oral Daily   famotidine  20 mg Oral Daily   furosemide  80 mg Oral Daily   heparin  5,000 Units  Subcutaneous Q8H   hydrALAZINE  25 mg Oral TID   insulin aspart  0-9 Units Subcutaneous TID WC   insulin aspart  3 Units Subcutaneous TID WC   insulin glargine-yfgn  5 Units Subcutaneous QHS   multivitamin  1 tablet Oral QHS   pantoprazole  80 mg Oral Daily   polyethylene glycol  17 g Oral Daily   sevelamer carbonate  2,400 mg Oral TID WC   sodium chloride flush  3 mL Intravenous Q12H   sodium zirconium cyclosilicate  10 g Oral Once per day on Sun Tue Thu Sat    Dialysis Orders: MWF at [facility], 400/500, EDW 90kg, 2K/2Ca, LUE AVF, heparin 4000 unit IV bolus - No ESA   Assessment/Plan: Recurrent L hip osteomyelitis: S/p 08/10/21 surgery (removal hip IMN which was placed 12/2019) with persistent VRE infection. Back to OR 08/28/21, 08/31/21 for L hip debridement and wound vac placement (now removed). Finished 6 week course of IV Daptomycin on 10/12/21. Management per primary team.  Chronic L hip/stump pain: She does have fractures of L femoral neck (where hardware was  removed) as well as distal femur Fx. Pain control per primary. ?May need to involve ortho again or consider palliative consult for pain management. ESRD: Continue HD on usual MWF - next 9/18 Hyperkalemia: Recurrent issues, on 2K bath + furosemide. Lokelma 10g added on non-HD days - seems better. AVF dysfunction: S/p branch ligation in OR 09/19/2021 per Dr. Virl Cagey. Was having intermittent cannulation issues, now working well. HTN/ volume: BP controlled on amlodipine/hydralazine/Coreg. Edema still present  - reminded on fluid restrictions (exceeds). Getting ~4.5L UFG q HD. Anemia of ESRD: Hgb at goal, not on ESA.  Secondary HPTH: PTH/Ca/Phos ok. Not on VDRA. Nutrition: Alb low, continue protein supplements. DM2: Insulin per primary Dispo: SNF previously recommended, now she would like to go home on discharge. Dialyzed in recliner on 9/4 (although signed off HD early after 2.5hr) - try again when agreeable (refused on 9/6,  9/8, 9/11, 9/13. 9/15).  Lynnda Child PA-C [facility] 11/16/2021,9:15 AM

## 2021-11-16 NOTE — Plan of Care (Signed)
  Problem: Activity: Goal: Risk for activity intolerance will decrease Outcome: Progressing   Problem: Pain Managment: Goal: General experience of comfort will improve Outcome: Progressing   

## 2021-11-16 NOTE — Plan of Care (Signed)
  Problem: Pain Managment: Goal: General experience of comfort will improve Outcome: Progressing   Problem: Fluid Volume: Goal: Compliance with measures to maintain balanced fluid volume will improve Outcome: Progressing   Problem: Nutritional: Goal: Ability to make healthy dietary choices will improve Outcome: Progressing

## 2021-11-16 NOTE — Progress Notes (Signed)
Per patient IV was leaking yesterday in dialysis and it was removed.  I documented not present on my assessment  today and notified provider.  Provider said OK without IV access at this point.

## 2021-11-16 NOTE — Progress Notes (Signed)
PROGRESS NOTE  [name] BZJ:[id number] DOB: May 13, 1965   PCP: [name], MD   DOA: 08/26/2021 LOS: 82  Chief complaints Chief Complaint  Patient presents with   Wound Infection     Brief Narrative / Interim history: 56 y.o. female with a history of ESRD, anemia of CKD, diabetes mellitus type 2, hyperlipidemia, CVA, anxiety, depression, GERD, tobacco, left hip fracture s/p IM nail placement and recent hardware removal. Patient presented secondary to left hip bleeding and found to have evidence of subacute osteomyelitis and abscess of left femur. Orthopedic surgery/infectious disease consulted. Patient was started on empiric antibiotics and underwent multiple I&Ds. Patient completed a 6 week course of daptomycin.  Prolonged hospitalization due to patient's refusal/inability to do HD in chair.  Subjective: Seen and examined earlier this morning.  No major events overnight of this morning.  No complaints other than some pain in her left hip that has improved after pain medication.  Objective: Vitals:   11/15/21 1631 11/15/21 2057 11/16/21 0529 11/16/21 0852  BP: (!) 118/54 (!) 125/59 (!) 130/58 (!) 132/58  Pulse: 70 70 (!) 58 64  Resp: 16 15 18 18   Temp: 98.9 F (37.2 C) 98.7 F (37.1 C) 98.2 F (36.8 C) 98.3 F (36.8 C)  TempSrc: Oral  Oral Oral  SpO2: 97% 98% 100% 100%  Weight:      Height:        Examination:  GENERAL: No apparent distress.  Nontoxic. HEENT: MMM.  Vision and hearing grossly intact.  NECK: Supple.  No apparent JVD.  RESP:  No IWOB.  Fair aeration bilaterally. CVS:  RRR. Heart sounds normal.  ABD/GI/GU: BS+. Abd soft, NTND.  MSK/EXT:  Moves extremities.  Left BKA. SKIN: no apparent skin lesion or wound NEURO: Awake and alert. Oriented appropriately.  No apparent focal neuro deficit. PSYCH: Calm. Normal affect.    Assessment and plan: 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  Active Problems:   ESRD (end stage  renal disease) on dialysis ([place])   Type 2 diabetes mellitus with hyperlipidemia (HCC)   History of CVA (cerebrovascular accident)   Essential hypertension   Anxiety and depression   GASTROESOPHAGEAL REFLUX, NO ESOPHAGITIS   TOBACCO DEPENDENCE   Hyponatremia   Obesity (BMI 30.0-34.9)   Unilateral amputation of left foot (HCC)   Wound infection   History of CVA (cerebrovascular accident) without residual deficits   Hyperkalemia   Wound dehiscence, surgical, sequela  ESRD on HD MWF/anasarca -Per nephrology. -Has to do HD in chair for outpatient HD.   Subacute osteomyelitis of left femur with abscess/infected left hip hardware s/p removal on 6/10 prior to admission with I&D on 6/28 and 7/1 -Completed 6 weeks of appropriate antibiotics per ID recommendation.   -CT hip on 9/2 with fluid collection and soft tissue thickening.   -Ortho rec-outpatient f/up with Dr. [name] 1 wk after discharge.  Low suspicion for persistent infection. -Pain control   History of left intertrochanteric fracture: previously managed with IM nail placement in 2021. Hardware removed secondary to above.   Hyponatremia/Hypokalemia/Hyperkalemia/hypomagnesemia -Management with hemodialysis   ABLA superimposed on anemia of chronic disease: Stable. Recent Labs    10/23/21 0556 10/25/21 0800 10/30/21 1347 11/01/21 0700 11/04/21 1437 11/06/21 1001 11/08/21 0740 11/11/21 1327 11/13/21 0721 11/14/21 0339  HGB 12.0 12.2 11.5* 11.4* 11.5* 10.8* 11.3* 11.0* 11.4* 11.7*  -Monitor intermittently   Controlled IDDM-2 with hyperglycemia and ESRD: A1c 5.9%. Recent Labs  Lab 11/14/21 2043 11/15/21 1257  11/15/21 1631 11/15/21 2058 11/16/21 0716  GLUCAP 199* 143* 179* 151* 253*  -Continue SSI-sensitive -Increase NovoLog from 3 to 4 units 3 times daily with meals -Continue Semglee 5 units at night    Primary hypertension -Continue amlodipine, hydralazine and Coreg   History of CVA/HLD -Continue Lipitor    GERD -Continue Pepcid and Protonix   Tobacco use -Tobacco cessation discussed during this admission.  Anxiety and depression: -Continue home meds  Body mass index is 28.22 kg/m.          DVT prophylaxis:  Place TED hose Start: 09/06/21 1507 SCDs Start: 08/31/21 1058 heparin injection 5,000 Units Start: 08/26/21 1615  Code Status: Nephrology Family Communication: None at bedside Level of care: Med-Surg Status is: Inpatient Remains inpatient appropriate because: Lack of safe disposition and not tolerating HD in chair.   Final disposition: SNF Consultants:  Nephrology Orthopedic surgery   Sch Meds:  Scheduled Meds:  (feeding supplement) PROSource Plus  30 mL Oral BID BM   alosetron  1 mg Oral BID   amitriptyline  25 mg Oral QHS   amLODipine  10 mg Oral Q24H   atorvastatin  40 mg Oral q1800   carvedilol  6.25 mg Oral BID   cyclobenzaprine  10 mg Oral TID   docusate sodium  200 mg Oral BID   DULoxetine  60 mg Oral Daily   famotidine  20 mg Oral Daily   furosemide  80 mg Oral Daily   heparin  5,000 Units Subcutaneous Q8H   hydrALAZINE  25 mg Oral TID   insulin aspart  0-9 Units Subcutaneous TID WC   insulin aspart  3 Units Subcutaneous TID WC   insulin glargine-yfgn  5 Units Subcutaneous QHS   multivitamin  1 tablet Oral QHS   pantoprazole  80 mg Oral Daily   polyethylene glycol  17 g Oral Daily   sevelamer carbonate  2,400 mg Oral TID WC   sodium zirconium cyclosilicate  10 g Oral Once per day on Sun Tue Thu Sat   Continuous Infusions:  methocarbamol (ROBAXIN) IV     PRN Meds:.acetaminophen, bisacodyl, calcium carbonate, methocarbamol **OR** methocarbamol (ROBAXIN) IV, ondansetron **OR** ondansetron (ZOFRAN) IV, mouth rinse, oxyCODONE, oxyCODONE, prochlorperazine  Antimicrobials: Anti-infectives (From admission, onward)    Start     Dose/Rate Route Frequency Ordered Stop   10/02/21 2000  DAPTOmycin (CUBICIN) 900 mg in sodium chloride 0.9 % IVPB  Status:   Discontinued        10 mg/kg  92.4 kg 136 mL/hr over 30 Minutes Intravenous Once per day on Mon Wed Fri 10/01/21 1350 10/12/21 1146   09/30/21 2000  DAPTOmycin (CUBICIN) 900 mg in sodium chloride 0.9 % IVPB  Status:  Discontinued        10 mg/kg  92.4 kg 136 mL/hr over 30 Minutes Intravenous Daily 09/28/21 0919 10/01/21 1350   09/28/21 2000  DAPTOmycin (CUBICIN) 900 mg in sodium chloride 0.9 % IVPB        900 mg 136 mL/hr over 30 Minutes Intravenous  Once 09/28/21 0919 09/28/21 2146   09/02/21 2000  DAPTOmycin (CUBICIN) 900 mg in sodium chloride 0.9 % IVPB  Status:  Discontinued        10 mg/kg  92.4 kg 136 mL/hr over 30 Minutes Intravenous Once per day on Mon Wed Fri 09/02/21 0836 09/28/21 0919   08/31/21 0745  ceFAZolin (ANCEF) IVPB 2g/100 mL premix        2 g 200 mL/hr over 30 Minutes Intravenous  To Short Stay 08/31/21 0730 08/31/21 0918   08/30/21 0600  ceFAZolin (ANCEF) IVPB 2g/100 mL premix  Status:  Discontinued        2 g 200 mL/hr over 30 Minutes Intravenous To Short Stay 08/30/21 0123 08/31/21 0600   08/29/21 2000  DAPTOmycin (CUBICIN) 900 mg in sodium chloride 0.9 % IVPB  Status:  Discontinued        10 mg/kg  92.4 kg 136 mL/hr over 30 Minutes Intravenous Every 48 hours 08/29/21 1627 09/02/21 0836   08/28/21 1800  ceFAZolin (ANCEF) IVPB 2g/100 mL premix  Status:  Discontinued        2 g 200 mL/hr over 30 Minutes Intravenous Every M-W-F (1800) 08/27/21 0846 08/29/21 1620   08/28/21 1200  ceFAZolin (ANCEF) IVPB 1 g/50 mL premix  Status:  Discontinued        1 g 100 mL/hr over 30 Minutes Intravenous Every M-W-F (Hemodialysis) 08/26/21 1954 08/27/21 0846   08/28/21 0600  ceFAZolin (ANCEF) IVPB 2g/100 mL premix        2 g 200 mL/hr over 30 Minutes Intravenous On call to O.R. 08/27/21 1150 08/28/21 0909   08/27/21 1800  ceFAZolin (ANCEF) IVPB 1 g/50 mL premix        1 g 100 mL/hr over 30 Minutes Intravenous  Once 08/26/21 1954 08/27/21 2048   08/26/21 1500  ceFEPIme  (MAXIPIME) 1 g in sodium chloride 0.9 % 100 mL IVPB  Status:  Discontinued        1 g 200 mL/hr over 30 Minutes Intravenous Every 24 hours 08/26/21 1452 08/26/21 1714        I have personally reviewed the following labs and images: CBC: Recent Labs  Lab 11/11/21 1327 11/13/21 0721 11/14/21 0339  WBC 6.7 6.6 6.7  NEUTROABS  --   --  3.3  HGB 11.0* 11.4* 11.7*  HCT 34.0* 34.7* 36.2  MCV 86.3 86.3 85.2  PLT 201 195 196   BMP &GFR Recent Labs  Lab 11/10/21 0303 11/11/21 1327 11/13/21 0721 11/14/21 0339  NA 131* 128* 129* 130*  K 4.8 5.2* 4.8 4.6  CL 93* 91* 94* 90*  CO2 29 26 26 28   GLUCOSE 150* 159* 154* 166*  BUN 71* 98* 74* 48*  CREATININE 5.55* 6.90* 5.72* 4.52*  CALCIUM 9.1 8.8* 9.0 9.1  MG  --   --   --  1.6*  PHOS 5.3* 5.6* 6.1* 4.6   Estimated Creatinine Clearance: 17.3 mL/min (A) (by C-G formula based on SCr of 4.52 mg/dL (H)). Liver & Pancreas: Recent Labs  Lab 11/10/21 0303 11/11/21 1327 11/13/21 0721 11/14/21 0339  ALBUMIN 3.0* 3.0* 3.0* 3.0*   No results for input(s): "LIPASE", "AMYLASE" in the last 168 hours. No results for input(s): "AMMONIA" in the last 168 hours. Diabetic: Recent Labs    11/14/21 0339  HGBA1C 5.9*   Recent Labs  Lab 11/14/21 2043 11/15/21 1257 11/15/21 1631 11/15/21 2058 11/16/21 0716  GLUCAP 199* 143* 179* 151* 253*   Cardiac Enzymes: No results for input(s): "CKTOTAL", "CKMB", "CKMBINDEX", "TROPONINI" in the last 168 hours. No results for input(s): "PROBNP" in the last 8760 hours. Coagulation Profile: No results for input(s): "INR", "PROTIME" in the last 168 hours. Thyroid Function Tests: No results for input(s): "TSH", "T4TOTAL", "FREET4", "T3FREE", "THYROIDAB" in the last 72 hours. Lipid Profile: No results for input(s): "CHOL", "HDL", "LDLCALC", "TRIG", "CHOLHDL", "LDLDIRECT" in the last 72 hours. Anemia Panel: No results for input(s): "VITAMINB12", "FOLATE", "FERRITIN", "TIBC", "IRON", "RETICCTPCT" in  the  last 72 hours. Urine analysis:    Component Value Date/Time   COLORURINE YELLOW (A) 08/02/2021 1750   APPEARANCEUR HAZY (A) 08/02/2021 1750   LABSPEC 1.014 08/02/2021 1750   PHURINE 5.0 08/02/2021 1750   GLUCOSEU 50 (A) 08/02/2021 1750   GLUCOSEU NEGATIVE 03/29/2014 1259   HGBUR NEGATIVE 08/02/2021 1750   BILIRUBINUR NEGATIVE 08/02/2021 1750   BILIRUBINUR neg 02/16/2017 1501   KETONESUR NEGATIVE 08/02/2021 1750   PROTEINUR 100 (A) 08/02/2021 1750   UROBILINOGEN 0.2 02/16/2017 1501   UROBILINOGEN 0.2 03/29/2014 1259   NITRITE NEGATIVE 08/02/2021 1750   LEUKOCYTESUR TRACE (A) 08/02/2021 1750   Sepsis Labs: Invalid input(s): "PROCALCITONIN", "LACTICIDVEN"  Microbiology: No results found for this or any previous visit (from the past 240 hour(s)).  Radiology Studies: No results found.    Peytyn Trine T. [place]  If 7PM-7AM, please contact night-coverage [email or web address] 11/16/2021, 1:37 PM

## 2021-11-17 DIAGNOSIS — E1169 Type 2 diabetes mellitus with other specified complication: Secondary | ICD-10-CM | POA: Diagnosis not present

## 2021-11-17 DIAGNOSIS — Z8673 Personal history of transient ischemic attack (TIA), and cerebral infarction without residual deficits: Secondary | ICD-10-CM | POA: Diagnosis not present

## 2021-11-17 DIAGNOSIS — M86252 Subacute osteomyelitis, left femur: Secondary | ICD-10-CM | POA: Diagnosis not present

## 2021-11-17 DIAGNOSIS — N186 End stage renal disease: Secondary | ICD-10-CM | POA: Diagnosis not present

## 2021-11-17 LAB — GLUCOSE, CAPILLARY
Glucose-Capillary: 188 mg/dL — ABNORMAL HIGH (ref 70–99)
Glucose-Capillary: 157 mg/dL — ABNORMAL HIGH (ref 70–99)
Glucose-Capillary: 190 mg/dL — ABNORMAL HIGH (ref 70–99)
Glucose-Capillary: 121 mg/dL — ABNORMAL HIGH (ref 70–99)

## 2021-11-17 MED ORDER — CHLORHEXIDINE GLUCONATE CLOTH 2 % EX PADS
6.0000 | MEDICATED_PAD | Freq: Every day | CUTANEOUS | Status: DC
  Administered 2021-11-18 – 2021-11-20 (×2): 6 via TOPICAL

## 2021-11-17 NOTE — Progress Notes (Signed)
PROGRESS NOTE  [name] MEQ:[id number] DOB: Jan 22, 1966   PCP: [name], MD   DOA: 08/26/2021 LOS: 24  Chief complaints Chief Complaint  Patient presents with   Wound Infection     Brief Narrative / Interim history: 56 y.o. female with a history of ESRD, anemia of CKD, diabetes mellitus type 2, hyperlipidemia, CVA, anxiety, depression, GERD, tobacco, left hip fracture s/p IM nail placement and recent hardware removal. Patient presented secondary to left hip bleeding and found to have evidence of subacute osteomyelitis and abscess of left femur. Orthopedic surgery/infectious disease consulted. Patient was started on empiric antibiotics and underwent multiple I&Ds. Patient completed a 6 week course of daptomycin.  Prolonged hospitalization due to patient's refusal/inability to do HD in chair.  Subjective: Seen and examined earlier this morning.  No major events overnight of this morning.  No major complaints other than interruption at night for vitals and lab work.  Objective: Vitals:   11/16/21 1806 11/16/21 2103 11/17/21 0517 11/17/21 0930  BP: (!) 114/59 132/61 (!) 158/57 (!) 142/69  Pulse: 62 66 62 66  Resp: 16 16 18 18   Temp: 98.4 F (36.9 C) 98.5 F (36.9 C) 97.9 F (36.6 C) 98.6 F (37 C)  TempSrc: Oral Oral Oral   SpO2: 96% 97% 100% 98%  Weight:      Height:        Examination:  GENERAL: No apparent distress.  Nontoxic. HEENT: MMM.  Vision and hearing grossly intact.  NECK: Supple.  No apparent JVD.  RESP:  No IWOB.  Fair aeration bilaterally. CVS:  RRR. Heart sounds normal.  ABD/GI/GU: BS+. Abd soft, NTND.  MSK/EXT:  Moves extremities.  Left BKA. SKIN: no apparent skin lesion or wound NEURO: Awake and alert. Oriented appropriately.  No apparent focal neuro deficit. PSYCH: Calm. Normal affect.    Assessment and plan: 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  Active Problems:   ESRD (end stage renal disease) on dialysis  ([place])   Type 2 diabetes mellitus with hyperlipidemia (HCC)   History of CVA (cerebrovascular accident)   Essential hypertension   Anxiety and depression   GASTROESOPHAGEAL REFLUX, NO ESOPHAGITIS   TOBACCO DEPENDENCE   Hyponatremia   Obesity (BMI 30.0-34.9)   Unilateral amputation of left foot (HCC)   Wound infection   History of CVA (cerebrovascular accident) without residual deficits   Hyperkalemia   Wound dehiscence, surgical, sequela  ESRD on HD MWF/anasarca -Per nephrology. -Has to do HD in chair for outpatient HD.   Subacute osteomyelitis of left femur with abscess/infected left hip hardware s/p removal on 6/10 prior to admission with I&D on 6/28 and 7/1 -Completed 6 weeks of appropriate antibiotics per ID recommendation.   -CT hip on 9/2 with fluid collection and soft tissue thickening.   -Ortho rec-outpatient f/up with Dr. [name] 1 wk after discharge.  Low suspicion for persistent infection. -Pain control   History of left intertrochanteric fracture: previously managed with IM nail placement in 2021. Hardware removed secondary to above.   Hyponatremia/Hypokalemia/Hyperkalemia/hypomagnesemia -Management with hemodialysis   ABLA superimposed on anemia of chronic disease: Stable. Recent Labs    10/23/21 0556 10/25/21 0800 10/30/21 1347 11/01/21 0700 11/04/21 1437 11/06/21 1001 11/08/21 0740 11/11/21 1327 11/13/21 0721 11/14/21 0339  HGB 12.0 12.2 11.5* 11.4* 11.5* 10.8* 11.3* 11.0* 11.4* 11.7*  -Monitor intermittently   Controlled IDDM-2 with hyperglycemia and ESRD: A1c 5.9%. Recent Labs  Lab 11/16/21 1150 11/16/21 1641 11/16/21 2103 11/17/21 2979 11/17/21  1141  GLUCAP 187* 147* 179* 188* 157*  -Continue SSI-sensitive -Increase NovoLog from 3 to 4 units 3 times daily with meals -Continue Semglee 5 units at night    Primary hypertension -Continue amlodipine, hydralazine and Coreg   History of CVA/HLD -Continue Lipitor   GERD -Continue Pepcid and  Protonix   Tobacco use -Tobacco cessation discussed during this admission.  Anxiety and depression: -Continue home meds  Body mass index is 28.22 kg/m.          DVT prophylaxis:  Place TED hose Start: 09/06/21 1507 SCDs Start: 08/31/21 1058 heparin injection 5,000 Units Start: 08/26/21 1615  Code Status: Nephrology Family Communication: None at bedside Level of care: Med-Surg Status is: Inpatient Remains inpatient appropriate because: Lack of safe disposition and not tolerating HD in chair.   Final disposition: SNF Consultants:  Nephrology Orthopedic surgery   Sch Meds:  Scheduled Meds:  (feeding supplement) PROSource Plus  30 mL Oral BID BM   alosetron  1 mg Oral BID   amitriptyline  25 mg Oral QHS   amLODipine  10 mg Oral Q24H   atorvastatin  40 mg Oral q1800   carvedilol  6.25 mg Oral BID   Chlorhexidine Gluconate Cloth  6 each Topical Q0600   cyclobenzaprine  10 mg Oral TID   docusate sodium  200 mg Oral BID   DULoxetine  60 mg Oral Daily   famotidine  20 mg Oral Daily   furosemide  80 mg Oral Daily   heparin  5,000 Units Subcutaneous Q8H   hydrALAZINE  25 mg Oral TID   insulin aspart  0-9 Units Subcutaneous TID WC   insulin aspart  4 Units Subcutaneous TID WC   insulin glargine-yfgn  5 Units Subcutaneous QHS   multivitamin  1 tablet Oral QHS   pantoprazole  80 mg Oral Daily   polyethylene glycol  17 g Oral Daily   sevelamer carbonate  2,400 mg Oral TID WC   sodium zirconium cyclosilicate  10 g Oral Once per day on Sun Tue Thu Sat   Continuous Infusions:  methocarbamol (ROBAXIN) IV     PRN Meds:.acetaminophen, bisacodyl, calcium carbonate, methocarbamol **OR** methocarbamol (ROBAXIN) IV, ondansetron **OR** ondansetron (ZOFRAN) IV, mouth rinse, oxyCODONE, oxyCODONE, prochlorperazine  Antimicrobials: Anti-infectives (From admission, onward)    Start     Dose/Rate Route Frequency Ordered Stop   10/02/21 2000  DAPTOmycin (CUBICIN) 900 mg in sodium  chloride 0.9 % IVPB  Status:  Discontinued        10 mg/kg  92.4 kg 136 mL/hr over 30 Minutes Intravenous Once per day on Mon Wed Fri 10/01/21 1350 10/12/21 1146   09/30/21 2000  DAPTOmycin (CUBICIN) 900 mg in sodium chloride 0.9 % IVPB  Status:  Discontinued        10 mg/kg  92.4 kg 136 mL/hr over 30 Minutes Intravenous Daily 09/28/21 0919 10/01/21 1350   09/28/21 2000  DAPTOmycin (CUBICIN) 900 mg in sodium chloride 0.9 % IVPB        900 mg 136 mL/hr over 30 Minutes Intravenous  Once 09/28/21 0919 09/28/21 2146   09/02/21 2000  DAPTOmycin (CUBICIN) 900 mg in sodium chloride 0.9 % IVPB  Status:  Discontinued        10 mg/kg  92.4 kg 136 mL/hr over 30 Minutes Intravenous Once per day on Mon Wed Fri 09/02/21 0836 09/28/21 0919   08/31/21 0745  ceFAZolin (ANCEF) IVPB 2g/100 mL premix        2 g 200  mL/hr over 30 Minutes Intravenous To Short Stay 08/31/21 0730 08/31/21 0918   08/30/21 0600  ceFAZolin (ANCEF) IVPB 2g/100 mL premix  Status:  Discontinued        2 g 200 mL/hr over 30 Minutes Intravenous To Short Stay 08/30/21 0123 08/31/21 0600   08/29/21 2000  DAPTOmycin (CUBICIN) 900 mg in sodium chloride 0.9 % IVPB  Status:  Discontinued        10 mg/kg  92.4 kg 136 mL/hr over 30 Minutes Intravenous Every 48 hours 08/29/21 1627 09/02/21 0836   08/28/21 1800  ceFAZolin (ANCEF) IVPB 2g/100 mL premix  Status:  Discontinued        2 g 200 mL/hr over 30 Minutes Intravenous Every M-W-F (1800) 08/27/21 0846 08/29/21 1620   08/28/21 1200  ceFAZolin (ANCEF) IVPB 1 g/50 mL premix  Status:  Discontinued        1 g 100 mL/hr over 30 Minutes Intravenous Every M-W-F (Hemodialysis) 08/26/21 1954 08/27/21 0846   08/28/21 0600  ceFAZolin (ANCEF) IVPB 2g/100 mL premix        2 g 200 mL/hr over 30 Minutes Intravenous On call to O.R. 08/27/21 1150 08/28/21 0909   08/27/21 1800  ceFAZolin (ANCEF) IVPB 1 g/50 mL premix        1 g 100 mL/hr over 30 Minutes Intravenous  Once 08/26/21 1954 08/27/21 2048    08/26/21 1500  ceFEPIme (MAXIPIME) 1 g in sodium chloride 0.9 % 100 mL IVPB  Status:  Discontinued        1 g 200 mL/hr over 30 Minutes Intravenous Every 24 hours 08/26/21 1452 08/26/21 1714        I have personally reviewed the following labs and images: CBC: Recent Labs  Lab 11/11/21 1327 11/13/21 0721 11/14/21 0339  WBC 6.7 6.6 6.7  NEUTROABS  --   --  3.3  HGB 11.0* 11.4* 11.7*  HCT 34.0* 34.7* 36.2  MCV 86.3 86.3 85.2  PLT 201 195 196   BMP &GFR Recent Labs  Lab 11/11/21 1327 11/13/21 0721 11/14/21 0339  NA 128* 129* 130*  K 5.2* 4.8 4.6  CL 91* 94* 90*  CO2 26 26 28   GLUCOSE 159* 154* 166*  BUN 98* 74* 48*  CREATININE 6.90* 5.72* 4.52*  CALCIUM 8.8* 9.0 9.1  MG  --   --  1.6*  PHOS 5.6* 6.1* 4.6   Estimated Creatinine Clearance: 17.3 mL/min (A) (by C-G formula based on SCr of 4.52 mg/dL (H)). Liver & Pancreas: Recent Labs  Lab 11/11/21 1327 11/13/21 0721 11/14/21 0339  ALBUMIN 3.0* 3.0* 3.0*   No results for input(s): "LIPASE", "AMYLASE" in the last 168 hours. No results for input(s): "AMMONIA" in the last 168 hours. Diabetic: No results for input(s): "HGBA1C" in the last 72 hours.  Recent Labs  Lab 11/16/21 1150 11/16/21 1641 11/16/21 2103 11/17/21 0712 11/17/21 1141  GLUCAP 187* 147* 179* 188* 157*   Cardiac Enzymes: No results for input(s): "CKTOTAL", "CKMB", "CKMBINDEX", "TROPONINI" in the last 168 hours. No results for input(s): "PROBNP" in the last 8760 hours. Coagulation Profile: No results for input(s): "INR", "PROTIME" in the last 168 hours. Thyroid Function Tests: No results for input(s): "TSH", "T4TOTAL", "FREET4", "T3FREE", "THYROIDAB" in the last 72 hours. Lipid Profile: No results for input(s): "CHOL", "HDL", "LDLCALC", "TRIG", "CHOLHDL", "LDLDIRECT" in the last 72 hours. Anemia Panel: No results for input(s): "VITAMINB12", "FOLATE", "FERRITIN", "TIBC", "IRON", "RETICCTPCT" in the last 72 hours. Urine analysis:     Component Value Date/Time  COLORURINE YELLOW (A) 08/02/2021 1750   APPEARANCEUR HAZY (A) 08/02/2021 1750   LABSPEC 1.014 08/02/2021 1750   PHURINE 5.0 08/02/2021 1750   GLUCOSEU 50 (A) 08/02/2021 1750   GLUCOSEU NEGATIVE 03/29/2014 1259   HGBUR NEGATIVE 08/02/2021 1750   BILIRUBINUR NEGATIVE 08/02/2021 1750   BILIRUBINUR neg 02/16/2017 1501   KETONESUR NEGATIVE 08/02/2021 1750   PROTEINUR 100 (A) 08/02/2021 1750   UROBILINOGEN 0.2 02/16/2017 1501   UROBILINOGEN 0.2 03/29/2014 1259   NITRITE NEGATIVE 08/02/2021 1750   LEUKOCYTESUR TRACE (A) 08/02/2021 1750   Sepsis Labs: Invalid input(s): "PROCALCITONIN", "LACTICIDVEN"  Microbiology: No results found for this or any previous visit (from the past 240 hour(s)).  Radiology Studies: No results found.    Kesa Birky T. [place]  If 7PM-7AM, please contact night-coverage [email or web address] 11/17/2021, 12:55 PM

## 2021-11-17 NOTE — Progress Notes (Signed)
[facility] Progress Note   Subjective:  Seen in room. No new events. Feels like her hip pain has been worse. Declines recliner dialysis for Monday.    Objective Vitals:   11/16/21 1806 11/16/21 2103 11/17/21 0517 11/17/21 0930  BP: (!) 114/59 132/61 (!) 158/57 (!) 142/69  Pulse: 62 66 62 66  Resp: 16 16 18 18   Temp: 98.4 F (36.9 C) 98.5 F (36.9 C) 97.9 F (36.6 C) 98.6 F (37 C)  TempSrc: Oral Oral Oral   SpO2: 96% 97% 100% 98%  Weight:      Height:       Physical Exam General: Well appearing woman, NAD. Room air. Heart: RRR; no murmur Lungs: CTAB; no rales or wheezing Abdomen: soft and non-tender Extremities: L BKA with stump edema; 1+ B thigh edema - some improvement Dialysis Access: L AVF + thrill  Additional Objective Labs: Basic Metabolic Panel: Recent Labs  Lab 11/11/21 1327 11/13/21 0721 11/14/21 0339  NA 128* 129* 130*  K 5.2* 4.8 4.6  CL 91* 94* 90*  CO2 26 26 28   GLUCOSE 159* 154* 166*  BUN 98* 74* 48*  CREATININE 6.90* 5.72* 4.52*  CALCIUM 8.8* 9.0 9.1  PHOS 5.6* 6.1* 4.6    Liver Function Tests: Recent Labs  Lab 11/11/21 1327 11/13/21 0721 11/14/21 0339  ALBUMIN 3.0* 3.0* 3.0*    CBC: Recent Labs  Lab 11/11/21 1327 11/13/21 0721 11/14/21 0339  WBC 6.7 6.6 6.7  NEUTROABS  --   --  3.3  HGB 11.0* 11.4* 11.7*  HCT 34.0* 34.7* 36.2  MCV 86.3 86.3 85.2  PLT 201 195 196    Medications:  methocarbamol (ROBAXIN) IV      (feeding supplement) PROSource Plus  30 mL Oral BID BM   alosetron  1 mg Oral BID   amitriptyline  25 mg Oral QHS   amLODipine  10 mg Oral Q24H   atorvastatin  40 mg Oral q1800   carvedilol  6.25 mg Oral BID   cyclobenzaprine  10 mg Oral TID   docusate sodium  200 mg Oral BID   DULoxetine  60 mg Oral Daily   famotidine  20 mg Oral Daily   furosemide  80 mg Oral Daily   heparin  5,000 Units Subcutaneous Q8H   hydrALAZINE  25 mg Oral TID   insulin aspart  0-9 Units Subcutaneous TID WC    insulin aspart  4 Units Subcutaneous TID WC   insulin glargine-yfgn  5 Units Subcutaneous QHS   multivitamin  1 tablet Oral QHS   pantoprazole  80 mg Oral Daily   polyethylene glycol  17 g Oral Daily   sevelamer carbonate  2,400 mg Oral TID WC   sodium zirconium cyclosilicate  10 g Oral Once per day on Sun Tue Thu Sat    Dialysis Orders: MWF at [facility], 400/500, EDW 90kg, 2K/2Ca, LUE AVF, heparin 4000 unit IV bolus - No ESA   Assessment/Plan: Recurrent L hip osteomyelitis: S/p 08/10/21 surgery (removal hip IMN which was placed 12/2019) with persistent VRE infection. Back to OR 08/28/21, 08/31/21 for L hip debridement and wound vac placement (now removed). Finished 6 week course of IV Daptomycin on 10/12/21. Management per primary team.  Chronic L hip/stump pain: She does have fractures of L femoral neck (where hardware was removed) as well as distal femur Fx. Pain control per primary. ?May need to involve ortho again or consider palliative consult for pain management. ESRD: Continue HD on  usual MWF - next 9/18 Hyperkalemia: Recurrent issues, on 2K bath + furosemide. Lokelma 10g added on non-HD days - seems better. AVF dysfunction: S/p branch ligation in OR 09/19/2021 per Dr. Virl Cagey. Was having intermittent cannulation issues, now working well. HTN/ volume: BP controlled on amlodipine/hydralazine/Coreg. Edema still present  - reminded on fluid restrictions (exceeds). Getting ~4.5L UFG q HD. Anemia of ESRD: Hgb at goal, not on ESA.  Secondary HPTH: PTH/Ca/Phos ok. Not on VDRA. Nutrition: Alb low, continue protein supplements. DM2: Insulin per primary Dispo: SNF previously recommended, now she would like to go home on discharge. Dialyzed in recliner on 9/4 (although signed off HD early after 2.5hr) - try again when agreeable (refused on 9/6, 9/8, 9/11, 9/13. 9/15).  Lynnda Child PA-C [facility] 11/17/2021,9:44 AM

## 2021-11-17 NOTE — Plan of Care (Signed)
  Problem: Elimination: Goal: Will not experience complications related to bowel motility Outcome: Progressing   

## 2021-11-18 DIAGNOSIS — E1169 Type 2 diabetes mellitus with other specified complication: Secondary | ICD-10-CM | POA: Diagnosis not present

## 2021-11-18 DIAGNOSIS — Z8673 Personal history of transient ischemic attack (TIA), and cerebral infarction without residual deficits: Secondary | ICD-10-CM | POA: Diagnosis not present

## 2021-11-18 DIAGNOSIS — N186 End stage renal disease: Secondary | ICD-10-CM | POA: Diagnosis not present

## 2021-11-18 DIAGNOSIS — M86252 Subacute osteomyelitis, left femur: Secondary | ICD-10-CM | POA: Diagnosis not present

## 2021-11-18 LAB — GLUCOSE, CAPILLARY
Glucose-Capillary: 182 mg/dL — ABNORMAL HIGH (ref 70–99)
Glucose-Capillary: 132 mg/dL — ABNORMAL HIGH (ref 70–99)
Glucose-Capillary: 282 mg/dL — ABNORMAL HIGH (ref 70–99)
Glucose-Capillary: 162 mg/dL — ABNORMAL HIGH (ref 70–99)

## 2021-11-18 LAB — RENAL FUNCTION PANEL
Albumin: 3.2 g/dL — ABNORMAL LOW (ref 3.5–5.0)
Anion gap: 15 (ref 5–15)
BUN: 97 mg/dL — ABNORMAL HIGH (ref 6–20)
CO2: 26 mmol/L (ref 22–32)
Calcium: 9 mg/dL (ref 8.9–10.3)
Chloride: 85 mmol/L — ABNORMAL LOW (ref 98–111)
Creatinine, Ser: 6.61 mg/dL — ABNORMAL HIGH (ref 0.44–1.00)
GFR, Estimated: 7 mL/min — ABNORMAL LOW (ref >60)
Glucose, Bld: 176 mg/dL — ABNORMAL HIGH (ref 70–99)
Phosphorus: 6.6 mg/dL — ABNORMAL HIGH (ref 2.5–4.6)
Potassium: 4.9 mmol/L (ref 3.5–5.1)
Sodium: 126 mmol/L — ABNORMAL LOW (ref 135–145)

## 2021-11-18 LAB — CBC
HCT: 33.2 % — ABNORMAL LOW (ref 36.0–46.0)
Hemoglobin: 11.2 g/dL — ABNORMAL LOW (ref 12.0–15.0)
MCH: 28.4 pg (ref 26.0–34.0)
MCHC: 33.7 g/dL (ref 30.0–36.0)
MCV: 84.3 fL (ref 80.0–100.0)
Platelets: 234 10*3/uL (ref 150–400)
RBC: 3.94 MIL/uL (ref 3.87–5.11)
RDW: 15.2 % (ref 11.5–15.5)
WBC: 6.7 10*3/uL (ref 4.0–10.5)
nRBC: 0 % (ref 0.0–0.2)

## 2021-11-18 MED ORDER — HEPARIN SODIUM (PORCINE) 1000 UNIT/ML DIALYSIS
4000.0000 [IU] | Freq: Once | INTRAMUSCULAR | Status: AC
  Administered 2021-11-18: 4000 [IU] via INTRAVENOUS_CENTRAL
  Filled 2021-11-18: qty 4

## 2021-11-18 MED ORDER — PENTAFLUOROPROP-TETRAFLUOROETH EX AERO: 1.0000 | INHALATION_SPRAY | CUTANEOUS | Status: DC | PRN

## 2021-11-18 NOTE — Progress Notes (Signed)
PROGRESS NOTE  [name] FIE:[id number] DOB: 26-Jan-1966   PCP: [name], MD   DOA: 08/26/2021 LOS: 37  Chief complaints Chief Complaint  Patient presents with   Wound Infection     Brief Narrative / Interim history: 56 y.o. female with a history of ESRD, anemia of CKD, diabetes mellitus type 2, hyperlipidemia, CVA, anxiety, depression, GERD, tobacco, left hip fracture s/p IM nail placement and recent hardware removal. Patient presented secondary to left hip bleeding and found to have evidence of subacute osteomyelitis and abscess of left femur. Orthopedic surgery/infectious disease consulted. Patient was started on empiric antibiotics and underwent multiple I&Ds. Patient completed a 6 week course of daptomycin.  Prolonged hospitalization due to patient's refusal/inability to do HD in chair.  Subjective: Seen and examined earlier this morning while on HD.  No major events overnight of this morning.  No complaints.  Objective: Vitals:   11/18/21 1200 11/18/21 1230 11/18/21 1300 11/18/21 1310  BP: (!) 117/57 (!) 116/53 121/60 (!) 130/57  Pulse: 66 65 66 68  Resp: (!) 8 (!) 7 (!) 8 14  Temp:      TempSrc:      SpO2: 95% 93% 96% 100%  Weight:    91 kg  Height:        Examination:  GENERAL: No apparent distress.  Nontoxic. HEENT: MMM.  Vision and hearing grossly intact.  NECK: Supple.  No apparent JVD.  RESP:  No IWOB.  Fair aeration bilaterally. CVS:  RRR. Heart sounds normal.  ABD/GI/GU: BS+. Abd soft, NTND.  MSK/EXT:  Moves extremities.  Left BKA SKIN: no apparent skin lesion or wound NEURO: Awake and alert. Oriented appropriately.  No apparent focal neuro deficit. PSYCH: Calm. Normal affect.    Assessment and plan: 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  Active Problems:   ESRD (end stage renal disease) on dialysis ([place])   Type 2 diabetes mellitus with hyperlipidemia (HCC)   History of CVA (cerebrovascular accident)    Essential hypertension   Anxiety and depression   GASTROESOPHAGEAL REFLUX, NO ESOPHAGITIS   TOBACCO DEPENDENCE   Hyponatremia   Obesity (BMI 30.0-34.9)   Unilateral amputation of left foot (HCC)   Wound infection   History of CVA (cerebrovascular accident) without residual deficits   Hyperkalemia   Wound dehiscence, surgical, sequela  ESRD on HD MWF/anasarca -Per nephrology. -Has to do HD in chair for outpatient HD.   Subacute osteomyelitis of left femur with abscess/infected left hip hardware s/p removal on 6/10 prior to admission with I&D on 6/28 and 7/1 -Completed 6 weeks of appropriate antibiotics per ID recommendation.   -CT hip on 9/2 with fluid collection and soft tissue thickening.   -Ortho rec-outpatient f/up with Dr. [name] 1 wk after discharge.  Low suspicion for persistent infection. -Pain control   History of left intertrochanteric fracture: previously managed with IM nail placement in 2021. Hardware removed secondary to above.   Hyponatremia/Hypokalemia/Hyperkalemia/hypomagnesemia -Management with hemodialysis   ABLA superimposed on anemia of chronic disease: Stable. Recent Labs    10/25/21 0800 10/30/21 1347 11/01/21 0700 11/04/21 1437 11/06/21 1001 11/08/21 0740 11/11/21 1327 11/13/21 0721 11/14/21 0339 11/18/21 0920  HGB 12.2 11.5* 11.4* 11.5* 10.8* 11.3* 11.0* 11.4* 11.7* 11.2*  -Monitor intermittently   Controlled IDDM-2 with hyperglycemia and ESRD: A1c 5.9%. Recent Labs  Lab 11/17/21 0712 11/17/21 1141 11/17/21 1632 11/17/21 2100 11/18/21 0730  GLUCAP 188* 157* 190* 121* 182*  -Continue SSI-sensitive -Increase NovoLog from 3 to  4 units 3 times daily with meals -Continue Semglee 5 units at night    Primary hypertension -Continue amlodipine, hydralazine and Coreg   History of CVA/HLD -Continue Lipitor   GERD -Continue Pepcid and Protonix   Tobacco use -Tobacco cessation discussed during this admission.  Anxiety and  depression: -Continue home meds  Body mass index is 28.79 kg/m.          DVT prophylaxis:  Place TED hose Start: 09/06/21 1507 SCDs Start: 08/31/21 1058 heparin injection 5,000 Units Start: 08/26/21 1615  Code Status: Nephrology Family Communication: None at bedside Level of care: Med-Surg Status is: Inpatient Remains inpatient appropriate because: Lack of safe disposition and not tolerating HD in chair.   Final disposition: SNF Consultants:  Nephrology Orthopedic surgery   Sch Meds:  Scheduled Meds:  (feeding supplement) PROSource Plus  30 mL Oral BID BM   alosetron  1 mg Oral BID   amitriptyline  25 mg Oral QHS   amLODipine  10 mg Oral Q24H   atorvastatin  40 mg Oral q1800   carvedilol  6.25 mg Oral BID   Chlorhexidine Gluconate Cloth  6 each Topical Q0600   cyclobenzaprine  10 mg Oral TID   docusate sodium  200 mg Oral BID   DULoxetine  60 mg Oral Daily   famotidine  20 mg Oral Daily   furosemide  80 mg Oral Daily   heparin  5,000 Units Subcutaneous Q8H   hydrALAZINE  25 mg Oral TID   insulin aspart  0-9 Units Subcutaneous TID WC   insulin aspart  4 Units Subcutaneous TID WC   insulin glargine-yfgn  5 Units Subcutaneous QHS   multivitamin  1 tablet Oral QHS   pantoprazole  80 mg Oral Daily   polyethylene glycol  17 g Oral Daily   sevelamer carbonate  2,400 mg Oral TID WC   sodium zirconium cyclosilicate  10 g Oral Once per day on Sun Tue Thu Sat   Continuous Infusions:  methocarbamol (ROBAXIN) IV     PRN Meds:.acetaminophen, bisacodyl, calcium carbonate, methocarbamol **OR** methocarbamol (ROBAXIN) IV, ondansetron **OR** ondansetron (ZOFRAN) IV, mouth rinse, oxyCODONE, oxyCODONE, prochlorperazine  Antimicrobials: Anti-infectives (From admission, onward)    Start     Dose/Rate Route Frequency Ordered Stop   10/02/21 2000  DAPTOmycin (CUBICIN) 900 mg in sodium chloride 0.9 % IVPB  Status:  Discontinued        10 mg/kg  92.4 kg 136 mL/hr over 30  Minutes Intravenous Once per day on Mon Wed Fri 10/01/21 1350 10/12/21 1146   09/30/21 2000  DAPTOmycin (CUBICIN) 900 mg in sodium chloride 0.9 % IVPB  Status:  Discontinued        10 mg/kg  92.4 kg 136 mL/hr over 30 Minutes Intravenous Daily 09/28/21 0919 10/01/21 1350   09/28/21 2000  DAPTOmycin (CUBICIN) 900 mg in sodium chloride 0.9 % IVPB        900 mg 136 mL/hr over 30 Minutes Intravenous  Once 09/28/21 0919 09/28/21 2146   09/02/21 2000  DAPTOmycin (CUBICIN) 900 mg in sodium chloride 0.9 % IVPB  Status:  Discontinued        10 mg/kg  92.4 kg 136 mL/hr over 30 Minutes Intravenous Once per day on Mon Wed Fri 09/02/21 0836 09/28/21 0919   08/31/21 0745  ceFAZolin (ANCEF) IVPB 2g/100 mL premix        2 g 200 mL/hr over 30 Minutes Intravenous To Short Stay 08/31/21 0730 08/31/21 0918   08/30/21 0600  ceFAZolin (ANCEF) IVPB 2g/100 mL premix  Status:  Discontinued        2 g 200 mL/hr over 30 Minutes Intravenous To Short Stay 08/30/21 0123 08/31/21 0600   08/29/21 2000  DAPTOmycin (CUBICIN) 900 mg in sodium chloride 0.9 % IVPB  Status:  Discontinued        10 mg/kg  92.4 kg 136 mL/hr over 30 Minutes Intravenous Every 48 hours 08/29/21 1627 09/02/21 0836   08/28/21 1800  ceFAZolin (ANCEF) IVPB 2g/100 mL premix  Status:  Discontinued        2 g 200 mL/hr over 30 Minutes Intravenous Every M-W-F (1800) 08/27/21 0846 08/29/21 1620   08/28/21 1200  ceFAZolin (ANCEF) IVPB 1 g/50 mL premix  Status:  Discontinued        1 g 100 mL/hr over 30 Minutes Intravenous Every M-W-F (Hemodialysis) 08/26/21 1954 08/27/21 0846   08/28/21 0600  ceFAZolin (ANCEF) IVPB 2g/100 mL premix        2 g 200 mL/hr over 30 Minutes Intravenous On call to O.R. 08/27/21 1150 08/28/21 0909   08/27/21 1800  ceFAZolin (ANCEF) IVPB 1 g/50 mL premix        1 g 100 mL/hr over 30 Minutes Intravenous  Once 08/26/21 1954 08/27/21 2048   08/26/21 1500  ceFEPIme (MAXIPIME) 1 g in sodium chloride 0.9 % 100 mL IVPB  Status:   Discontinued        1 g 200 mL/hr over 30 Minutes Intravenous Every 24 hours 08/26/21 1452 08/26/21 1714        I have personally reviewed the following labs and images: CBC: Recent Labs  Lab 11/13/21 0721 11/14/21 0339 11/18/21 0920  WBC 6.6 6.7 6.7  NEUTROABS  --  3.3  --   HGB 11.4* 11.7* 11.2*  HCT 34.7* 36.2 33.2*  MCV 86.3 85.2 84.3  PLT 195 196 234   BMP &GFR Recent Labs  Lab 11/13/21 0721 11/14/21 0339 11/18/21 0920  NA 129* 130* 126*  K 4.8 4.6 4.9  CL 94* 90* 85*  CO2 26 28 26   GLUCOSE 154* 166* 176*  BUN 74* 48* 97*  CREATININE 5.72* 4.52* 6.61*  CALCIUM 9.0 9.1 9.0  MG  --  1.6*  --   PHOS 6.1* 4.6 6.6*   Estimated Creatinine Clearance: 11.9 mL/min (A) (by C-G formula based on SCr of 6.61 mg/dL (H)). Liver & Pancreas: Recent Labs  Lab 11/13/21 0721 11/14/21 0339 11/18/21 0920  ALBUMIN 3.0* 3.0* 3.2*   No results for input(s): "LIPASE", "AMYLASE" in the last 168 hours. No results for input(s): "AMMONIA" in the last 168 hours. Diabetic: No results for input(s): "HGBA1C" in the last 72 hours.  Recent Labs  Lab 11/17/21 0712 11/17/21 1141 11/17/21 1632 11/17/21 2100 11/18/21 0730  GLUCAP 188* 157* 190* 121* 182*   Cardiac Enzymes: No results for input(s): "CKTOTAL", "CKMB", "CKMBINDEX", "TROPONINI" in the last 168 hours. No results for input(s): "PROBNP" in the last 8760 hours. Coagulation Profile: No results for input(s): "INR", "PROTIME" in the last 168 hours. Thyroid Function Tests: No results for input(s): "TSH", "T4TOTAL", "FREET4", "T3FREE", "THYROIDAB" in the last 72 hours. Lipid Profile: No results for input(s): "CHOL", "HDL", "LDLCALC", "TRIG", "CHOLHDL", "LDLDIRECT" in the last 72 hours. Anemia Panel: No results for input(s): "VITAMINB12", "FOLATE", "FERRITIN", "TIBC", "IRON", "RETICCTPCT" in the last 72 hours. Urine analysis:    Component Value Date/Time   COLORURINE YELLOW (A) 08/02/2021 1750   APPEARANCEUR HAZY (A)  08/02/2021 1750   LABSPEC  1.014 08/02/2021 1750   PHURINE 5.0 08/02/2021 1750   GLUCOSEU 50 (A) 08/02/2021 1750   GLUCOSEU NEGATIVE 03/29/2014 1259   HGBUR NEGATIVE 08/02/2021 1750   BILIRUBINUR NEGATIVE 08/02/2021 1750   BILIRUBINUR neg 02/16/2017 1501   KETONESUR NEGATIVE 08/02/2021 1750   PROTEINUR 100 (A) 08/02/2021 1750   UROBILINOGEN 0.2 02/16/2017 1501   UROBILINOGEN 0.2 03/29/2014 1259   NITRITE NEGATIVE 08/02/2021 1750   LEUKOCYTESUR TRACE (A) 08/02/2021 1750   Sepsis Labs: Invalid input(s): "PROCALCITONIN", "LACTICIDVEN"  Microbiology: No results found for this or any previous visit (from the past 240 hour(s)).  Radiology Studies: No results found.    [name] T. [place]  If 7PM-7AM, please contact night-coverage [email or web address] 11/18/2021, 2:00 PM

## 2021-11-18 NOTE — Progress Notes (Addendum)
Received patient in bed to unit.  Alert and oriented.  Informed consent signed and in chart.   Treatment initiated: 0900 Treatment completed: 1300  Patient tolerated well.  Transported back to the room  Alert, without acute distress.  Hand-off given to patient's nurse.   Access used: AVF Access issues: none  Total UF removed: 4 L Medication(s) given:  Zofran 4 mg  iv Post HD VS: 130/57 P 68 R 14 Post HD weight: 91 KG   [facility]

## 2021-11-18 NOTE — Progress Notes (Signed)
[facility] Progress Note   Subjective: Seen on HD. UFG set inappropriately high. Adjusted to 4 liters. Asking about doing Home Hemo. Continued issues with high gains and hyperkalemia while in hospital.   Objective Vitals:   11/18/21 0902 11/18/21 0930 11/18/21 1000 11/18/21 1030  BP:  118/63 110/64 121/77  Pulse:  65 68 72  Resp:  10 (!) 8 12  Temp:      TempSrc:      SpO2:  99% 99% 100%  Weight: 94.9 kg     Height:       Physical Exam General: Chronically ill appearing female in NAD Heart: S1,S2 SR on monitor. No M/R/G Lungs: CTAB Abdomen: Obese, NABS Extremities: L BKA with edema. Edema RLE Dialysis Access: L AVF cannulated at present.    Additional Objective Labs: Basic Metabolic Panel: Recent Labs  Lab 11/13/21 0721 11/14/21 0339 11/18/21 0920  NA 129* 130* 126*  K 4.8 4.6 4.9  CL 94* 90* 85*  CO2 26 28 26   GLUCOSE 154* 166* 176*  BUN 74* 48* 97*  CREATININE 5.72* 4.52* 6.61*  CALCIUM 9.0 9.1 9.0  PHOS 6.1* 4.6 6.6*   Liver Function Tests: Recent Labs  Lab 11/13/21 0721 11/14/21 0339 11/18/21 0920  ALBUMIN 3.0* 3.0* 3.2*   No results for input(s): "LIPASE", "AMYLASE" in the last 168 hours. CBC: Recent Labs  Lab 11/11/21 1327 11/13/21 0721 11/14/21 0339 11/18/21 0920  WBC 6.7 6.6 6.7 6.7  NEUTROABS  --   --  3.3  --   HGB 11.0* 11.4* 11.7* 11.2*  HCT 34.0* 34.7* 36.2 33.2*  MCV 86.3 86.3 85.2 84.3  PLT 201 195 196 234   Blood Culture    Component Value Date/Time   SDES TISSUE 08/28/2021 0917   SPECREQUEST LEFT THIGH TISSUE 08/28/2021 0917   CULT  08/28/2021 0917    MODERATE ENTEROCOCCUS FAECIUM VANCOMYCIN RESISTANT ENTEROCOCCUS ISOLATED NO ANAEROBES ISOLATED Sent to [facility] for further susceptibility testing. Performed at [facility], [place] [address], [place], [place] [address]    REPTSTATUS 09/02/2021 FINAL 08/28/2021 7793    Cardiac Enzymes: No results for input(s): "CKTOTAL", "CKMB", "CKMBINDEX",  "TROPONINI" in the last 168 hours. CBG: Recent Labs  Lab 11/17/21 0712 11/17/21 1141 11/17/21 1632 11/17/21 2100 11/18/21 0730  GLUCAP 188* 157* 190* 121* 182*   Iron Studies: No results for input(s): "IRON", "TIBC", "TRANSFERRIN", "FERRITIN" in the last 72 hours. @lablastinr3 @ Studies/Results: No results found. Medications:  methocarbamol (ROBAXIN) IV      (feeding supplement) PROSource Plus  30 mL Oral BID BM   alosetron  1 mg Oral BID   amitriptyline  25 mg Oral QHS   amLODipine  10 mg Oral Q24H   atorvastatin  40 mg Oral q1800   carvedilol  6.25 mg Oral BID   Chlorhexidine Gluconate Cloth  6 each Topical Q0600   cyclobenzaprine  10 mg Oral TID   docusate sodium  200 mg Oral BID   DULoxetine  60 mg Oral Daily   famotidine  20 mg Oral Daily   furosemide  80 mg Oral Daily   heparin  5,000 Units Subcutaneous Q8H   hydrALAZINE  25 mg Oral TID   insulin aspart  0-9 Units Subcutaneous TID WC   insulin aspart  4 Units Subcutaneous TID WC   insulin glargine-yfgn  5 Units Subcutaneous QHS   multivitamin  1 tablet Oral QHS   pantoprazole  80 mg Oral Daily   polyethylene glycol  17 g  Oral Daily   sevelamer carbonate  2,400 mg Oral TID WC   sodium zirconium cyclosilicate  10 g Oral Once per day on Sun Tue Thu Sat   MWF at [facility], 400/500, EDW 90kg, 2K/2Ca, LUE AVF,  -heparin 4000 unit IV bolus - No ESA    Assessment/Plan: Recurrent L hip osteomyelitis: S/p 08/10/21 surgery (removal hip IMN which was placed 12/2019) with persistent VRE infection. Back to OR 08/28/21, 08/31/21 for L hip debridement and wound vac placement (now removed). Finished 6 week course of IV Daptomycin on 10/12/21. Management per primary team.  Chronic L hip/stump pain: She does have fractures of L femoral neck (where hardware was removed) as well as distal femur Fx. Pain control per primary. ?May need to involve ortho again or consider palliative consult for pain management. ESRD: Continue HD on  usual MWF - Next HD 11/20/2021 Hyperkalemia: Recurrent issues, on 2K bath + furosemide. Lokelma 10g added on non-HD days - seems better. AVF dysfunction: S/p branch ligation in OR 09/19/2021 per Dr. Virl Cagey. Was having intermittent cannulation issues, now working well. HTN/volume: BP controlled on amlodipine/hydralazine/Coreg. Edema  still present  - reminded on fluid restrictions (exceeds). Getting ~4.5L UFG q HD. Anemia of ESRD: Hgb at goal, not on ESA.  Secondary HPTH: PTH/Ca/Phos ok. Not on VDRA. Nutrition: Alb low, continue protein supplements. DM2: Insulin per primary Dispo: SNF previously recommended, now she would like to go home on discharge even though HD unit has been clear that they are prohibited by company policy to assist patient out of her car and into the facility. Patient is aware of this.  Dialyzed in recliner on 9/4 (although signed off HD early after 2.5hr) - try again when agreeable (refused on 9/6, 9/8, 9/11, 9/13. 9/15, 11/18/2021).  Samaj Wessells H. Jerran Tappan NP-C 11/18/2021, 10:58 AM  [employer] [phone]

## 2021-11-19 ENCOUNTER — Inpatient Hospital Stay (HOSPITAL_COMMUNITY): Payer: Medicare Other

## 2021-11-19 DIAGNOSIS — E1169 Type 2 diabetes mellitus with other specified complication: Secondary | ICD-10-CM | POA: Diagnosis not present

## 2021-11-19 DIAGNOSIS — Z8673 Personal history of transient ischemic attack (TIA), and cerebral infarction without residual deficits: Secondary | ICD-10-CM | POA: Diagnosis not present

## 2021-11-19 DIAGNOSIS — N186 End stage renal disease: Secondary | ICD-10-CM | POA: Diagnosis not present

## 2021-11-19 DIAGNOSIS — M86252 Subacute osteomyelitis, left femur: Secondary | ICD-10-CM | POA: Diagnosis not present

## 2021-11-19 LAB — GLUCOSE, CAPILLARY
Glucose-Capillary: 265 mg/dL — ABNORMAL HIGH (ref 70–99)
Glucose-Capillary: 225 mg/dL — ABNORMAL HIGH (ref 70–99)
Glucose-Capillary: 116 mg/dL — ABNORMAL HIGH (ref 70–99)

## 2021-11-19 MED ORDER — CHLORHEXIDINE GLUCONATE CLOTH 2 % EX PADS
6.0000 | MEDICATED_PAD | Freq: Every day | CUTANEOUS | Status: DC
  Administered 2021-11-19 – 2021-11-20 (×2): 6 via TOPICAL

## 2021-11-19 MED ORDER — INSULIN GLARGINE-YFGN 100 UNIT/ML ~~LOC~~ SOLN
7.0000 [IU] | Freq: Every day | SUBCUTANEOUS | Status: DC
  Administered 2021-11-19 – 2021-12-25 (×37): 7 [IU] via SUBCUTANEOUS
  Filled 2021-11-19 (×38): qty 0.07

## 2021-11-19 NOTE — Progress Notes (Signed)
PROGRESS NOTE  [name] JSE:[id number] DOB: 05-Jan-1966   PCP: [name], MD   DOA: 08/26/2021 LOS: 68  Chief complaints Chief Complaint  Patient presents with   Wound Infection     Brief Narrative / Interim history: 56 y.o. female with a history of ESRD, anemia of CKD, diabetes mellitus type 2, hyperlipidemia, CVA, anxiety, depression, GERD, tobacco, left hip fracture s/p IM nail placement and recent hardware removal. Patient presented secondary to left hip bleeding and found to have evidence of subacute osteomyelitis and abscess of left femur. Orthopedic surgery/infectious disease consulted. Patient was started on empiric antibiotics and underwent multiple I&Ds. Patient completed a 6 week course of daptomycin.  Prolonged hospitalization due to patient's refusal/inability to do HD in chair.  Subjective: Seen and examined earlier this morning.  No major events overnight of this morning.  No complaints other than the usual pain in her left thigh.  However, patient complaint worsening right knee pain to therapy and refused to participate later in the day.  Objective: Vitals:   11/18/21 1750 11/18/21 2102 11/19/21 0555 11/19/21 0840  BP: (!) 126/53 (!) 122/57 (!) 138/53 (!) 150/55  Pulse: 62 63 (!) 58 63  Resp: 16 18 18 18   Temp: 98.3 F (36.8 C) 98 F (36.7 C) 97.6 F (36.4 C) 98.1 F (36.7 C)  TempSrc: Oral  Oral Oral  SpO2: 96% 96% 97% 96%  Weight:      Height:        Examination:  GENERAL: No apparent distress.  Nontoxic. HEENT: MMM.  Vision and hearing grossly intact.  NECK: Supple.  No apparent JVD.  RESP:  No IWOB.  Fair aeration bilaterally. CVS:  RRR. Heart sounds normal.  ABD/GI/GU: BS+. Abd soft, NTND.  MSK/EXT:  Moves extremities.  Left BKA. SKIN: no apparent skin lesion or wound NEURO: Awake and alert. Oriented appropriately.  No apparent focal neuro deficit. PSYCH: Calm. Normal affect.    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 -Has to do HD in chair for outpatient HD.   Subacute osteomyelitis of left femur with abscess/infected left hip hardware s/p removal on 6/10 prior to admission with I&D on 6/28 and 7/1 -Completed 6 weeks of appropriate antibiotics per ID recommendation.   -CT hip on 9/2 with fluid collection and soft tissue thickening.   -Ortho rec-outpatient f/up with Dr. [name] 1 wk after discharge.  Low suspicion for persistent infection. -Pain control   History of left intertrochanteric fracture: previously managed with IM nail placement in 2021. Hardware removed secondary to above.   Hyponatremia/Hypokalemia/Hyperkalemia/hypomagnesemia -Management with hemodialysis   ABLA superimposed on anemia of chronic disease: Stable. Recent Labs    10/25/21 0800 10/30/21 1347 11/01/21 0700 11/04/21 1437 11/06/21 1001 11/08/21 0740 11/11/21 1327 11/13/21 0721 11/14/21 0339 11/18/21 0920  HGB 12.2 11.5* 11.4* 11.5* 10.8* 11.3* 11.0* 11.4* 11.7* 11.2*  -Monitor intermittently   Controlled IDDM-2 with  hyperglycemia and ESRD: A1c 5.9%. Recent Labs  Lab 11/18/21 1414 11/18/21 1656 11/18/21 2101 11/19/21 0826 11/19/21 1137  GLUCAP 132* 282* 162* 265* 225*  -Continue SSI-sensitive -Continue NovoLog 4 units 3 times daily with meals -Increase Semglee from 5 to 7 units at night    Primary hypertension -Continue  amlodipine, hydralazine and Coreg   History of CVA/HLD -Continue Lipitor  Right knee pain: Likely osteoarthritis.  Patient reported worsening right knee pain to therapy and refused to participate -Check x-ray -Continue pain meds   GERD -Continue Pepcid and Protonix   Tobacco use -Tobacco cessation discussed during this admission.  Anxiety and depression: -Continue home meds  Body mass index is 28.79 kg/m.          DVT prophylaxis:  Place TED hose Start: 09/06/21 1507 SCDs Start: 08/31/21 1058 heparin injection 5,000 Units Start: 08/26/21 1615  Code Status: Nephrology Family Communication: None at bedside Level of care: Med-Surg Status is: Inpatient Remains inpatient appropriate because: Lack of safe disposition and not tolerating HD in chair.   Final disposition: SNF Consultants:  Nephrology Orthopedic surgery   Sch Meds:  Scheduled Meds:  (feeding supplement) PROSource Plus  30 mL Oral BID BM   alosetron  1 mg Oral BID   amitriptyline  25 mg Oral QHS   amLODipine  10 mg Oral Q24H   atorvastatin  40 mg Oral q1800   carvedilol  6.25 mg Oral BID   Chlorhexidine Gluconate Cloth  6 each Topical Q0600   Chlorhexidine Gluconate Cloth  6 each Topical Q0600   cyclobenzaprine  10 mg Oral TID   docusate sodium  200 mg Oral BID   DULoxetine  60 mg Oral Daily   famotidine  20 mg Oral Daily   furosemide  80 mg Oral Daily   heparin  5,000 Units Subcutaneous Q8H   hydrALAZINE  25 mg Oral TID   insulin aspart  0-9 Units Subcutaneous TID WC   insulin aspart  4 Units Subcutaneous TID WC   insulin glargine-yfgn  7 Units Subcutaneous QHS   multivitamin  1 tablet Oral QHS   pantoprazole  80 mg Oral Daily   polyethylene glycol  17 g Oral Daily   sevelamer carbonate  2,400 mg Oral TID WC   sodium zirconium cyclosilicate  10 g Oral Once per day on Sun Tue Thu Sat   Continuous Infusions:  methocarbamol (ROBAXIN) IV     PRN Meds:.acetaminophen, bisacodyl, calcium  carbonate, methocarbamol **OR** methocarbamol (ROBAXIN) IV, ondansetron **OR** ondansetron (ZOFRAN) IV, mouth rinse, oxyCODONE, oxyCODONE, prochlorperazine  Antimicrobials: Anti-infectives (From admission, onward)    Start     Dose/Rate Route Frequency Ordered Stop   10/02/21 2000  DAPTOmycin (CUBICIN) 900 mg in sodium chloride 0.9 % IVPB  Status:  Discontinued        10 mg/kg  92.4 kg 136 mL/hr over 30 Minutes Intravenous Once per day on Mon Wed Fri 10/01/21 1350 10/12/21 1146   09/30/21 2000  DAPTOmycin (CUBICIN) 900 mg in sodium chloride 0.9 % IVPB  Status:  Discontinued        10 mg/kg  92.4 kg 136 mL/hr over 30 Minutes Intravenous Daily 09/28/21 0919 10/01/21 1350   09/28/21 2000  DAPTOmycin (CUBICIN) 900 mg in sodium chloride 0.9 % IVPB        900 mg 136 mL/hr over 30 Minutes Intravenous  Once 09/28/21 0919 09/28/21 2146   09/02/21 2000  DAPTOmycin (CUBICIN) 900 mg in sodium chloride 0.9 % IVPB  Status:  Discontinued        10 mg/kg  92.4 kg 136 mL/hr over 30 Minutes Intravenous Once per day on Mon Wed Fri 09/02/21 0836 09/28/21 0919   08/31/21 0745  ceFAZolin (ANCEF) IVPB 2g/100 mL premix        2 g 200 mL/hr over 30 Minutes Intravenous To Short Stay 08/31/21 0730 08/31/21 0918   08/30/21 0600  ceFAZolin (ANCEF) IVPB 2g/100 mL premix  Status:  Discontinued        2 g 200 mL/hr over 30 Minutes Intravenous To Short Stay 08/30/21 0123 08/31/21 0600   08/29/21 2000  DAPTOmycin (CUBICIN) 900 mg in sodium chloride 0.9 % IVPB  Status:  Discontinued        10 mg/kg  92.4 kg 136 mL/hr over 30 Minutes Intravenous Every 48 hours 08/29/21 1627 09/02/21 0836   08/28/21 1800  ceFAZolin (ANCEF) IVPB 2g/100 mL premix  Status:  Discontinued        2 g 200 mL/hr over 30 Minutes Intravenous Every M-W-F (1800) 08/27/21 0846 08/29/21 1620   08/28/21 1200  ceFAZolin (ANCEF) IVPB 1 g/50 mL premix  Status:  Discontinued        1 g 100 mL/hr over 30 Minutes Intravenous Every M-W-F (Hemodialysis)  08/26/21 1954 08/27/21 0846   08/28/21 0600  ceFAZolin (ANCEF) IVPB 2g/100 mL premix        2 g 200 mL/hr over 30 Minutes Intravenous On call to O.R. 08/27/21 1150 08/28/21 0909   08/27/21 1800  ceFAZolin (ANCEF) IVPB 1 g/50 mL premix        1 g 100 mL/hr over 30 Minutes Intravenous  Once 08/26/21 1954 08/27/21 2048   08/26/21 1500  ceFEPIme (MAXIPIME) 1 g in sodium chloride 0.9 % 100 mL IVPB  Status:  Discontinued        1 g 200 mL/hr over 30 Minutes Intravenous Every 24 hours 08/26/21 1452 08/26/21 1714        I have personally reviewed the following labs and images: CBC: Recent Labs  Lab 11/13/21 0721 11/14/21 0339 11/18/21 0920  WBC 6.6 6.7 6.7  NEUTROABS  --  3.3  --   HGB 11.4* 11.7* 11.2*  HCT 34.7* 36.2 33.2*  MCV 86.3 85.2 84.3  PLT 195 196 234   BMP &GFR Recent Labs  Lab 11/13/21 0721 11/14/21 0339 11/18/21 0920  NA 129* 130* 126*  K 4.8 4.6 4.9  CL 94* 90* 85*  CO2 26 28 26   GLUCOSE 154* 166* 176*  BUN 74* 48* 97*  CREATININE 5.72* 4.52* 6.61*  CALCIUM 9.0 9.1 9.0  MG  --  1.6*  --   PHOS 6.1* 4.6 6.6*   Estimated Creatinine Clearance: 11.9 mL/min (A) (by C-G formula based on SCr of 6.61 mg/dL (H)). Liver & Pancreas: Recent Labs  Lab 11/13/21 0721 11/14/21 0339 11/18/21 0920  ALBUMIN 3.0* 3.0* 3.2*   No results for input(s): "LIPASE", "AMYLASE" in the last 168 hours. No results for input(s): "AMMONIA" in the last 168 hours. Diabetic: No results for input(s): "HGBA1C" in the last 72 hours.  Recent Labs  Lab 11/18/21 1414 11/18/21 1656 11/18/21 2101 11/19/21 0826 11/19/21 1137  GLUCAP 132* 282* 162* 265* 225*   Cardiac Enzymes: No results for input(s): "CKTOTAL", "CKMB", "CKMBINDEX", "TROPONINI" in the last 168 hours. No results for input(s): "PROBNP" in the last 8760 hours. Coagulation Profile: No results for input(s): "INR", "PROTIME" in the last 168 hours. Thyroid Function Tests: No results for input(s): "TSH", "T4TOTAL",  "  FREET4", "T3FREE", "THYROIDAB" in the last 72 hours. Lipid Profile: No results for input(s): "CHOL", "HDL", "LDLCALC", "TRIG", "CHOLHDL", "LDLDIRECT" in the last 72 hours. Anemia Panel: No results for input(s): "VITAMINB12", "FOLATE", "FERRITIN", "TIBC", "IRON", "RETICCTPCT" in the last 72 hours. Urine analysis:    Component Value Date/Time   COLORURINE YELLOW (A) 08/02/2021 1750   APPEARANCEUR HAZY (A) 08/02/2021 1750   LABSPEC 1.014 08/02/2021 1750   PHURINE 5.0 08/02/2021 1750   GLUCOSEU 50 (A) 08/02/2021 1750   GLUCOSEU NEGATIVE 03/29/2014 1259   HGBUR NEGATIVE 08/02/2021 1750   BILIRUBINUR NEGATIVE 08/02/2021 1750   BILIRUBINUR neg 02/16/2017 1501   KETONESUR NEGATIVE 08/02/2021 1750   PROTEINUR 100 (A) 08/02/2021 1750   UROBILINOGEN 0.2 02/16/2017 1501   UROBILINOGEN 0.2 03/29/2014 1259   NITRITE NEGATIVE 08/02/2021 1750   LEUKOCYTESUR TRACE (A) 08/02/2021 1750   Sepsis Labs: Invalid input(s): "PROCALCITONIN", "LACTICIDVEN"  Microbiology: No results found for this or any previous visit (from the past 240 hour(s)).  Radiology Studies: No results found.    Jaelani Posa T. [place]  If 7PM-7AM, please contact night-coverage [email or web address] 11/19/2021, 3:00 PM

## 2021-11-19 NOTE — Progress Notes (Signed)
Occupational Therapy Treatment Patient Details Name: [name] MRN: [id number] DOB: [birth year] Today's Date: 11/19/2021   History of present illness 56 y.o. female presented to ED 6/26 from dialysis with increased bloody drainage from L hip wound. 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 s/p hardware removal of left hip with partial resection of tuft tissue and femure that were nonviable on 08/10/21 Discharged home 6/23. underwent L hip debridement and wound vac placement 6/28; +Left intertrochanteric non-union,  Fracture of left distal femur  PMH: hypertension, hyperlipidemia, ESRD on HD MWF, history of left BKA in 2021, depression/anxiety, stroke, tobacco use, T2DM,  insomnia, chronic pain syndrome,   OT comments  Pt seen for goal update, recently finished with PT upon arrival. Pt reporting R knee pain and L hip pain, and declining OOB/EOB mobility. Discussed current goals with pt, pt stating she wants to improve her ability to transfer and perform LB ADLs. Pt improving with slide board transfer, discussed attempting transfers to drop arm BSC, pt declining until pain is resolved at this time. Pt with difficulty performing LB dressing due to pain, educated on compensatory strategies and encouraged continue performance of BUE HEP, pt verbalized understanding. Pt presenting with impairments listed below, will follow acutely. Continue to recommend SNF at d/c.   Recommendations for follow up therapy are one component of a multi-disciplinary discharge planning process, led by the attending physician.  Recommendations may be updated based on patient status, additional functional criteria and insurance authorization.    Follow Up Recommendations  Skilled nursing-short term rehab (<3 hours/day)    Assistance Recommended at Discharge Intermittent Supervision/Assistance  Patient can return home with the following  A lot of help with bathing/dressing/bathroom;Assistance  with cooking/housework;Assist for transportation;Help with stairs or ramp for entrance;Two people to help with walking and/or transfers   Equipment Recommendations  Other (comment) (TBD)    Recommendations for Other Services PT consult    Precautions / Restrictions Precautions Precautions: Fall;Other (comment) Precaution Comments: L BKA (baseline) Restrictions Weight Bearing Restrictions: Yes LLE Weight Bearing: Weight bearing as tolerated Other Position/Activity Restrictions: L distal femur fx; per Dr [name] 8/10 pt can full WB       Mobility Bed Mobility Overal bed mobility: Modified Independent             General bed mobility comments: pt declining bed mobility due to L hip and R knee pain    Transfers                   General transfer comment: pt declining due to pain     Balance                                           ADL either performed or assessed with clinical judgement   ADL Overall ADL's : Needs assistance/impaired Eating/Feeding: Set up;Bed level Eating/Feeding Details (indicate cue type and reason): eating lunch upon arrival Grooming: Set up;Bed level Grooming Details (indicate cue type and reason): wiping face             Lower Body Dressing: Min guard Lower Body Dressing Details (indicate cue type and reason): simulated, increased time/painful at this time to attempt bending knee for LB dressing task             Functional mobility during ADLs: Minimal assistance General ADL Comments: pt declining all other  ADLs at this time    Extremity/Trunk Assessment Upper Extremity Assessment Upper Extremity Assessment: Overall WFL for tasks assessed   Lower Extremity Assessment Lower Extremity Assessment: Defer to PT evaluation        Vision   Vision Assessment?: No apparent visual deficits   Perception Perception Perception: Not tested   Praxis Praxis Praxis: Not tested    Cognition Arousal/Alertness:  Awake/alert Behavior During Therapy: WFL for tasks assessed/performed Overall Cognitive Status: Impaired/Different from baseline Area of Impairment: Awareness                         Safety/Judgement: Decreased awareness of deficits Awareness: Emergent   General Comments: decreased awareness of abilities, states she "can get to drop arm BSC" despite explanation that it is slightly more difficult than transferring to chair        Exercises      Shoulder Instructions       General Comments reviewed education on compensatory strategies for LB dressing, and HEP to keep BUE strength for transfers, as pt progressing with chair transfer with slide board, discussed attempting drop arm BSC transfers, pt declining until her pain is better    Pertinent Vitals/ Pain       Pain Assessment Pain Assessment: Faces Pain Score: 8  Faces Pain Scale: Hurts whole lot Pain Location: R knee and L hip Pain Descriptors / Indicators: Discomfort, Grimacing, Guarding, Sharp Pain Intervention(s): Limited activity within patient's tolerance, Monitored during session, Repositioned  Home Living                                          Prior Functioning/Environment              Frequency  Min 2X/week        Progress Toward Goals  OT Goals(current goals can now be found in the care plan section)  Progress towards OT goals: Progressing toward goals  Acute Rehab OT Goals Patient Stated Goal: to get better OT Goal Formulation: With patient Time For Goal Achievement: 12/03/21 Potential to Achieve Goals: Fair ADL Goals Additional ADL Goal #1: Pt will be able to tolerate sitting up in chair for 2 hours in order to improve activity tolerance for ADLs/IADLs  Plan Discharge plan remains appropriate;Frequency remains appropriate    Co-evaluation                 AM-PAC OT "6 Clicks" Daily Activity     Outcome Measure   Help from another person eating meals?:  None Help from another person taking care of personal grooming?: None Help from another person toileting, which includes using toliet, bedpan, or urinal?: A Lot Help from another person bathing (including washing, rinsing, drying)?: A Lot Help from another person to put on and taking off regular upper body clothing?: A Little Help from another person to put on and taking off regular lower body clothing?: A Lot 6 Click Score: 17    End of Session    OT Visit Diagnosis: Muscle weakness (generalized) (M62.81);Unsteadiness on feet (R26.81);History of falling (Z91.81)   Activity Tolerance Patient tolerated treatment well   Patient Left in bed;with call bell/phone within reach;with bed alarm set   Nurse Communication Mobility status        Time: 1443-1540 OT Time Calculation (min): 13 min  Charges: OT General Charges $OT Visit: 1 Visit  OT Treatments $Self Care/Home Management : 8-22 mins  Lynnda Child, OTD, OTR/L Acute Rehab [phone] - [place] 11/19/2021, 2:54 PM

## 2021-11-19 NOTE — Progress Notes (Signed)
Physical Therapy Treatment Patient Details Name: [name] MRN: [id number] DOB: Jun 19, 1965 Today's Date: 11/19/2021   History of Present Illness 56 y.o. female presented to ED 6/26 from dialysis with increased bloody drainage from L hip wound. 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 s/p hardware removal of left hip with partial resection of tuft tissue and femure that were nonviable on 08/10/21 Discharged home 6/23. underwent L hip debridement and wound vac placement 6/28; +Left intertrochanteric non-union,  Fracture of left distal femur  PMH: hypertension, hyperlipidemia, ESRD on HD MWF, history of left BKA in 2021, depression/anxiety, stroke, tobacco use, T2DM,  insomnia, chronic pain syndrome,    PT Comments    Patient calm on arrival but wanting to talk about her lack of progress and worsening Rt knee pain. She now has pain with any movement of knee (initially was pain with standing/weight-bearing). She has ordered a compression sleeve via [facility] that she hopes will help, but remains frustrated that no further imaging has been done as her pain continues to worsen. Patient became tearful during session as we also talked about decr ROM she now has in LLE (hip external rotation contracture and knee flexion contracture). She had not tolerated ROM to this leg after distal femur fracture, but is now agreeable to address ROM with hopes to regain functional use of LLE with prosthesis. Patient appears to be becoming aware of her limitations, and although frustrated, willing to work on these areas (including sitting time in recliner). Will notify MD re: worsening knee pain.    Recommendations for follow up therapy are one component of a multi-disciplinary discharge planning process, led by the attending physician.  Recommendations may be updated based on patient status, additional functional criteria and insurance authorization.  Follow Up Recommendations  Skilled  nursing-short term rehab (<3 hours/day) Can patient physically be transported by private vehicle: No   Assistance Recommended at Discharge Intermittent Supervision/Assistance  Patient can return home with the following A lot of help with bathing/dressing/bathroom;Assistance with cooking/housework;Assist for transportation;Two people to help with walking and/or transfers;Help with stairs or ramp for entrance   Equipment Recommendations  None recommended by PT    Recommendations for Other Services OT consult     Precautions / Restrictions Precautions Precautions: Fall;Other (comment) Precaution Comments: L BKA (baseline) Restrictions Weight Bearing Restrictions: Yes LLE Weight Bearing: Weight bearing as tolerated Other Position/Activity Restrictions: L distal femur fx; per Dr [name] 8/10 pt can full WB     Mobility  Bed Mobility     Rolling: Min assist         General bed mobility comments: rolling to right to work on left hip internal rotation; encouraged pt to lie in this position for prolonged stretching    Transfers                   General transfer comment: deferred at pt request due to RLE pain; pt tearful with frustration of her situation and feeling MD's are not addressing her rt knee pain    Ambulation/Gait                   Stairs             Wheelchair Mobility    Modified Rankin (Stroke Patients Only)       Balance  Cognition Arousal/Alertness: Awake/alert Behavior During Therapy: WFL for tasks assessed/performed Overall Cognitive Status: Impaired/Different from baseline Area of Impairment: Awareness                         Safety/Judgement: Decreased awareness of deficits Awareness: Emergent   General Comments: beginning to see her limitations--now admits that she could not sit in HD chair for 4-6 hours due to left hip pain; now realizing she has to  work harder on LLE movement to reduce contractures        Exercises Total Joint Exercises Ankle Circles/Pumps: AROM, Right, 10 reps, Supine Heel Slides: AROM, AAROM, Right, 5 reps, Supine Hip ABduction/ADduction: AROM, 10 reps, Supine, Right Straight Leg Raises: AROM, Supine, 5 reps, Right Other Exercises Other Exercises: left knee extension PROM with contract/relax stretching of hamstrings; pt with ~45 degree flexion contracture Other Exercises: AAROM left hip internal rotation contract/relax with pt achieving ~45 degree external rotation contracture    General Comments        Pertinent Vitals/Pain Pain Assessment Pain Assessment: Faces Faces Pain Scale: Hurts whole lot Pain Location: right knee Pain Descriptors / Indicators: Discomfort, Grimacing, Guarding, Sharp Pain Intervention(s): Limited activity within patient's tolerance, Monitored during session, Premedicated before session    Home Living                          Prior Function            PT Goals (current goals can now be found in the care plan section) Acute Rehab PT Goals Patient Stated Goal: less pain with movement Time For Goal Achievement: 11/21/21 Potential to Achieve Goals: Fair Progress towards PT goals: Not progressing toward goals - comment    Frequency    Min 2X/week      PT Plan Current plan remains appropriate    Co-evaluation              AM-PAC PT "6 Clicks" Mobility   Outcome Measure  Help needed turning from your back to your side while in a flat bed without using bedrails?: None Help needed moving from lying on your back to sitting on the side of a flat bed without using bedrails?: None Help needed moving to and from a bed to a chair (including a wheelchair)?: A Little Help needed standing up from a chair using your arms (e.g., wheelchair or bedside chair)?: Total Help needed to walk in hospital room?: Total Help needed climbing 3-5 steps with a railing? :  Total 6 Click Score: 14    End of Session   Activity Tolerance: Patient limited by pain Patient left: in bed;with call bell/phone within reach Nurse Communication: Mobility status PT Visit Diagnosis: Other abnormalities of gait and mobility (R26.89);Muscle weakness (generalized) (M62.81);History of falling (Z91.81);Pain Pain - Right/Left: Left Pain - part of body: Leg     Time: 1330-1404 PT Time Calculation (min) (ACUTE ONLY): 34 min  Charges:  $Therapeutic Exercise: 23-37 mins                      Arby Barrette, PT Acute Rehabilitation Services  Office [phone]    [name] 11/19/2021, 2:24 PM

## 2021-11-19 NOTE — Progress Notes (Signed)
[facility] Progress Note   Subjective:   Pt seen in room. Feeling ok but hip is still sore. Asking about home dialysis so she does not have to transfer to chair. Her daughter could assist her but I am not sure how training would work since she is in the hospital. Will check with the home HD center. Pain remains a barrier to sitting. She is open to a palliative care consult. She denies SOB, CP, palpitations, dizziness and nausea.   Objective Vitals:   11/18/21 1750 11/18/21 2102 11/19/21 0555 11/19/21 0840  BP: (!) 126/53 (!) 122/57 (!) 138/53 (!) 150/55  Pulse: 62 63 (!) 58 63  Resp: 16 18 18 18   Temp: 98.3 F (36.8 C) 98 F (36.7 C) 97.6 F (36.4 C) 98.1 F (36.7 C)  TempSrc: Oral  Oral Oral  SpO2: 96% 96% 97% 96%  Weight:      Height:       Physical Exam General: [name] Heart: RRR, no murmurs, rubs or gallops Lungs: CTA bilaterally without wheezing, rhonchi or rales Abdomen: Soft, non-distended, +BS Extremities: trace edema L stump Dialysis Access: LUE AVG +bruit  Additional Objective Labs: Basic Metabolic Panel: Recent Labs  Lab 11/13/21 0721 11/14/21 0339 11/18/21 0920  NA 129* 130* 126*  K 4.8 4.6 4.9  CL 94* 90* 85*  CO2 26 28 26   GLUCOSE 154* 166* 176*  BUN 74* 48* 97*  CREATININE 5.72* 4.52* 6.61*  CALCIUM 9.0 9.1 9.0  PHOS 6.1* 4.6 6.6*   Liver Function Tests: Recent Labs  Lab 11/13/21 0721 11/14/21 0339 11/18/21 0920  ALBUMIN 3.0* 3.0* 3.2*   No results for input(s): "LIPASE", "AMYLASE" in the last 168 hours. CBC: Recent Labs  Lab 11/13/21 0721 11/14/21 0339 11/18/21 0920  WBC 6.6 6.7 6.7  NEUTROABS  --  3.3  --   HGB 11.4* 11.7* 11.2*  HCT 34.7* 36.2 33.2*  MCV 86.3 85.2 84.3  PLT 195 196 234   Blood Culture    Component Value Date/Time   SDES TISSUE 08/28/2021 0917   SPECREQUEST LEFT THIGH TISSUE 08/28/2021 0917   CULT  08/28/2021 0917    MODERATE ENTEROCOCCUS FAECIUM VANCOMYCIN RESISTANT  ENTEROCOCCUS ISOLATED NO ANAEROBES ISOLATED Sent to [place] for further susceptibility testing. Performed at [facility], [place] [address] [place], [place] [address]    REPTSTATUS 09/02/2021 FINAL 08/28/2021 9562    Cardiac Enzymes: No results for input(s): "CKTOTAL", "CKMB", "CKMBINDEX", "TROPONINI" in the last 168 hours. CBG: Recent Labs  Lab 11/18/21 0730 11/18/21 1414 11/18/21 1656 11/18/21 2101 11/19/21 0826  GLUCAP 182* 132* 282* 162* 265*   Iron Studies: No results for input(s): "IRON", "TIBC", "TRANSFERRIN", "FERRITIN" in the last 72 hours. @lablastinr3 @ Studies/Results: No results found. Medications:  methocarbamol (ROBAXIN) IV      (feeding supplement) PROSource Plus  30 mL Oral BID BM   alosetron  1 mg Oral BID   amitriptyline  25 mg Oral QHS   amLODipine  10 mg Oral Q24H   atorvastatin  40 mg Oral q1800   carvedilol  6.25 mg Oral BID   Chlorhexidine Gluconate Cloth  6 each Topical Q0600   cyclobenzaprine  10 mg Oral TID   docusate sodium  200 mg Oral BID   DULoxetine  60 mg Oral Daily   famotidine  20 mg Oral Daily   furosemide  80 mg Oral Daily   heparin  5,000 Units Subcutaneous Q8H   hydrALAZINE  25 mg Oral  TID   insulin aspart  0-9 Units Subcutaneous TID WC   insulin aspart  4 Units Subcutaneous TID WC   insulin glargine-yfgn  5 Units Subcutaneous QHS   multivitamin  1 tablet Oral QHS   pantoprazole  80 mg Oral Daily   polyethylene glycol  17 g Oral Daily   sevelamer carbonate  2,400 mg Oral TID WC   sodium zirconium cyclosilicate  10 g Oral Once per day on Sun Tue Thu Sat    Outpatient Dialysis Orders: MWF at [facility], 400/500, EDW 90kg, 2K/2Ca, LUE AVF,  -heparin 4000 unit IV bolus - No ESA    Assessment/Plan: Recurrent L hip osteomyelitis: S/p 08/10/21 surgery (removal hip IMN which was placed 12/2019) with persistent VRE infection. Back to OR 08/28/21, 08/31/21 for L hip debridement and wound vac placement (now removed).  Finished 6 week course of IV Daptomycin on 10/12/21. Management per primary team.  Chronic L hip/stump pain: She does have fractures of L femoral neck (where hardware was removed) as well as distal femur Fx. Pain control per primary. ?May need to involve ortho again or consider palliative consult for pain management. ESRD: Continue HD on usual MWF - Next HD 11/20/2021 Hyperkalemia: Recurrent issues, on 2K bath + furosemide. Lokelma 10g added on non-HD days - seems better. AVF dysfunction: S/p branch ligation in OR 09/19/2021 per Dr. Virl Cagey. Was having intermittent cannulation issues, now working well. HTN/volume: BP controlled on amlodipine/hydralazine/Coreg. Edema  still present  - reminded on fluid restrictions (exceeds). Getting ~4.5L UFG q HD. Anemia of ESRD: Hgb at goal, not on ESA.  Secondary HPTH: PTH/Ca/Phos ok. Not on VDRA. Nutrition: Alb low, continue protein supplements. DM2: Insulin per primary Dispo: SNF previously recommended, now she would like to go home on discharge even though HD unit has been clear that they are prohibited by company policy to assist patient out of her car and into the facility. Patient is aware of this.  Asks about home HD but I do not think they would be able to train her since she is in the hospital. She is open to a palliative care consult for better symptom management. Dialyzed in recliner on 9/4 but signed off early due to pain, has refused since then.   Anice Paganini, PA-C 11/19/2021, 8:47 AM  [facility] Pager: [phone]

## 2021-11-19 NOTE — TOC Progression Note (Addendum)
Transition of Care [facility]) - Initial/Assessment Note    Patient Details  Name: [name] MRN: [id number] Date of Birth: 11-21-1965  [facility]) CM/SW Contact:    Milinda Antis, LCSWA Phone Number: 11/19/2021, 1:32 PM  Clinical Narrative:                 CSW reviewed chart.  Patient is continues to receive dialysis via stretcher which is a barrier to discharge.  CSW read that patient is inquiring about home dialysis.  CSW spoke with renal navigator who will follow up to see if home dialysis would be possible.  CSW was informed that the patient would not be able to receive home dialysis.  TOC will continue to follow.    Expected Discharge Plan: Skilled Nursing Facility Barriers to Discharge: Continued Medical Work up   Patient Goals and CMS Choice Patient states their goals for this hospitalization and ongoing recovery are:: To return home CMS Medicare.gov Compare Post Acute Care list provided to:: Patient Choice offered to / list presented to : NA  Expected Discharge Plan and Services Expected Discharge Plan: [place]   Discharge Planning Services: CM Consult   Living arrangements for the past 2 months: Single Family Home                                      Prior Living Arrangements/Services Living arrangements for the past 2 months: Single Family Home Lives with:: Adult Children Patient language and need for interpreter reviewed:: Yes Do you feel safe going back to the place where you live?: Yes      Need for Family Participation in Patient Care: Yes (Comment) Care giver support system in place?: Yes (comment) Current home services: DME ([employer], w/c) Criminal Activity/Legal Involvement Pertinent to Current Situation/Hospitalization: No - Comment as needed  Activities of Daily Living Home Assistive Devices/Equipment: Eyeglasses, Wheelchair ADL Screening (condition at time of admission) Patient's cognitive ability adequate  to safely complete daily activities?: Yes Is the patient deaf or have difficulty hearing?: No Does the patient have difficulty seeing, even when wearing glasses/contacts?: No Does the patient have difficulty concentrating, remembering, or making decisions?: No Patient able to express need for assistance with ADLs?: Yes Does the patient have difficulty dressing or bathing?: Yes Independently performs ADLs?: No Communication: Independent Dressing (OT): Independent Is this a change from baseline?: Pre-admission baseline Grooming: Independent Feeding: Independent Bathing: Needs assistance Is this a change from baseline?: Pre-admission baseline Toileting: Needs assistance Is this a change from baseline?: Pre-admission baseline In/Out Bed: Needs assistance, Dependent Is this a change from baseline?: Pre-admission baseline Walks in Home: Dependent Is this a change from baseline?: Pre-admission baseline Does the patient have difficulty walking or climbing stairs?: Yes Weakness of Legs: Both Weakness of Arms/Hands: Both  Permission Sought/Granted Permission sought to share information with : Case Manager, Family Supports Permission granted to share information with : Yes, Verbal Permission Granted              Emotional Assessment Appearance:: Appears stated age Attitude/Demeanor/Rapport: Engaged, Gracious Affect (typically observed): Accepting, Appropriate, Calm, Hopeful Orientation: : Oriented to Self, Oriented to Place, Oriented to  Time, Oriented to Situation Alcohol / Substance Use: Not Applicable Psych Involvement: No (comment)  Admission diagnosis:  Wound infection [T14.8XXA, L08.9] Abscess of left hip [L02.416] Patient Active Problem List   Diagnosis Date Noted   Wound dehiscence, surgical,  sequela    Abscess of left hip 08/26/2021   History of CVA (cerebrovascular accident) 78/24/2353   Hardware complicating wound infection ([place])    Subacute osteomyelitis of left femur  with abscess     Septic arthritis of hip ([place]) 07/30/2021   Skin abscess L hip 07/28/2021   HCAP (healthcare-associated pneumonia) 07/27/2021   Hyperkalemia 61/44/3154   Metabolic acidemia 00/86/7619   Closed fracture of left distal femur ([place]) 07/22/2021   Type 2 diabetes mellitus with hyperlipidemia ([place]) 07/22/2021   Infection of superficial incisional surgical site after procedure 03/09/2020   Abscess    Hypoglycemia    Essential hypertension    Sleep disturbance    ESRD (end stage renal disease) on dialysis ([place]) 01/24/2020   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[place]) 01/23/2020   Hyponatremia    Constipation    Chronic osteomyelitis involving left ankle and foot ([place])    Ulcer of left foot with necrosis of bone ([place])    Wound infection 12/28/2019   History of [place] amputation of left foot ([place]) 12/25/2017   History of CVA (cerebrovascular accident) without residual deficits 07/04/2017   Cerebral thrombosis with cerebral infarction 04/08/2017   Right sided weakness 04/07/2017   Hyperhidrosis 09/01/2016   Migraine with aura and without status migrainosus, not intractable 07/28/2016   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[place]) 08/04/2014   CKD stage 3 due to type 2 diabetes mellitus ([place]) 06/27/2014   Vitamin D insufficiency 05/08/2014   Obesity (BMI 30.0-34.9) 05/08/2014   Unilateral amputation of left foot ([place]) 05/08/2014   Bursitis of left shoulder 02/14/2014   Neck pain 01/03/2014   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.23) 01/14/2013   Insomnia 08/04/2012   Diabetic neuropathy, painful ([place]) 08/04/2011   Anxiety and depression 05/16/2010   Female stress incontinence 11/01/2007   TOBACCO DEPENDENCE 04/30/2006   GASTROESOPHAGEAL REFLUX, NO ESOPHAGITIS 04/30/2006   Irritable bowel syndrome 04/30/2006   PCP:  [name], MD Pharmacy:   [facility], [place] [address] [place] [address] Phone: [phone] Fax: [phone]  [id number] - [place], [place] [address] [place] [address] Phone: [phone] Fax: [phone]     Social Determinants of Health (SDOH) Interventions    Readmission Risk Interventions    07/26/2021    3:15 PM  Readmission Risk Prevention Plan  Transportation Screening Complete  PCP or Specialist Appt within 3-5 Days Complete  HRI or [place] Complete  Palliative Care Screening Not Applicable  Medication Review (RN Care Manager) Referral to Pharmacy

## 2021-11-19 NOTE — Inpatient Diabetes Management (Signed)
Inpatient Diabetes Program Recommendations  AACE/ADA: New Consensus Statement on Inpatient Glycemic Control   Target Ranges:  Prepandial:   less than 140 mg/dL      Peak postprandial:   less than 180 mg/dL (1-2 hours)      Critically ill patients:  140 - 180 mg/dL    Latest Reference Range & Units 11/19/21 08:26 11/19/21 11:37  Glucose-Capillary 70 - 99 mg/dL 265 (H) 225 (H)    Latest Reference Range & Units 11/18/21 07:30 11/18/21 14:14 11/18/21 16:56 11/18/21 21:01  Glucose-Capillary 70 - 99 mg/dL 182 (H) 132 (H) 282 (H) 162 (H)   Review of Glycemic Control  Diabetes history: DM2 Outpatient Diabetes medications: Lantus 10 units QHS, Novolog 10  units TID with meals Current orders for Inpatient glycemic control: Semglee 5 units QHS, Novolog 4 units TID with meals, Novolog 0-9 units TID with meals   Inpatient Diabetes Program Recommendations:     Insulin: Please consider increasing Semglee to 6 units QHS and meal coverage to Novolog 5 units TID with meals.   Thanks, Barnie Alderman, RN, MSN, [place] [facility] Coordinator Inpatient Diabetes Program [phone] (Team Pager from 8am to [place])

## 2021-11-20 DIAGNOSIS — I10 Essential (primary) hypertension: Secondary | ICD-10-CM | POA: Diagnosis not present

## 2021-11-20 DIAGNOSIS — M86252 Subacute osteomyelitis, left femur: Secondary | ICD-10-CM | POA: Diagnosis not present

## 2021-11-20 DIAGNOSIS — K2100 Gastro-esophageal reflux disease with esophagitis, without bleeding: Secondary | ICD-10-CM | POA: Diagnosis not present

## 2021-11-20 LAB — CBC
HCT: 33 % — ABNORMAL LOW (ref 36.0–46.0)
Hemoglobin: 11.2 g/dL — ABNORMAL LOW (ref 12.0–15.0)
MCH: 28.4 pg (ref 26.0–34.0)
MCHC: 33.9 g/dL (ref 30.0–36.0)
MCV: 83.5 fL (ref 80.0–100.0)
Platelets: 200 10*3/uL (ref 150–400)
RBC: 3.95 MIL/uL (ref 3.87–5.11)
RDW: 14.9 % (ref 11.5–15.5)
WBC: 5.9 10*3/uL (ref 4.0–10.5)
nRBC: 0 % (ref 0.0–0.2)

## 2021-11-20 LAB — RENAL FUNCTION PANEL
Albumin: 3 g/dL — ABNORMAL LOW (ref 3.5–5.0)
Anion gap: 15 (ref 5–15)
BUN: 80 mg/dL — ABNORMAL HIGH (ref 6–20)
CO2: 25 mmol/L (ref 22–32)
Calcium: 8.8 mg/dL — ABNORMAL LOW (ref 8.9–10.3)
Chloride: 84 mmol/L — ABNORMAL LOW (ref 98–111)
Creatinine, Ser: 6.2 mg/dL — ABNORMAL HIGH (ref 0.44–1.00)
GFR, Estimated: 7 mL/min — ABNORMAL LOW (ref >60)
Glucose, Bld: 198 mg/dL — ABNORMAL HIGH (ref 70–99)
Phosphorus: 6.6 mg/dL — ABNORMAL HIGH (ref 2.5–4.6)
Potassium: 4.7 mmol/L (ref 3.5–5.1)
Sodium: 124 mmol/L — ABNORMAL LOW (ref 135–145)

## 2021-11-20 LAB — GLUCOSE, CAPILLARY
Glucose-Capillary: 256 mg/dL — ABNORMAL HIGH (ref 70–99)
Glucose-Capillary: 204 mg/dL — ABNORMAL HIGH (ref 70–99)
Glucose-Capillary: 154 mg/dL — ABNORMAL HIGH (ref 70–99)

## 2021-11-20 MED ORDER — PENTAFLUOROPROP-TETRAFLUOROETH EX AERO
INHALATION_SPRAY | CUTANEOUS | Status: AC
  Filled 2021-11-20: qty 30

## 2021-11-20 MED ORDER — HEPARIN SODIUM (PORCINE) 1000 UNIT/ML IJ SOLN
INTRAMUSCULAR | Status: AC
  Administered 2021-11-20: 4000 [IU]
  Filled 2021-11-20: qty 4

## 2021-11-20 NOTE — Progress Notes (Addendum)
[facility] Progress Note   Subjective: On HD usual high goal and usual refusal to sit in care. Palliative Care Consult. Home Hemodialysis has been mentioned but have reached out to Medical Director of HD center prior to further discussion. Spoke with Dr. Johnney Ou. Patient is not a candidate for home hemodialysis D/T issues with nonadherence.   Objective Vitals:   11/20/21 0900 11/20/21 0930 11/20/21 1000 11/20/21 1030  BP: (!) 149/61 (!) 142/60 (!) 129/53 (!) 148/58  Pulse: 65 61 65 60  Resp: 13 11 10 12   Temp:      TempSrc:      SpO2: 95% 95% 96% 95%  Weight:      Height:       Physical Exam General: Chronically ill appearing female in NAD Heart: S1,S2 SR on monitor. No M/R/G Lungs: CTAB Abdomen: Obese, NABS Extremities: L BKA with edema. Edema RLE Dialysis Access: L AVF cannulated at present.    Additional Objective Labs: Basic Metabolic Panel: Recent Labs  Lab 11/14/21 0339 11/18/21 0920 11/20/21 0758  NA 130* 126* 124*  K 4.6 4.9 4.7  CL 90* 85* 84*  CO2 28 26 25   GLUCOSE 166* 176* 198*  BUN 48* 97* 80*  CREATININE 4.52* 6.61* 6.20*  CALCIUM 9.1 9.0 8.8*  PHOS 4.6 6.6* 6.6*   Liver Function Tests: Recent Labs  Lab 11/14/21 0339 11/18/21 0920 11/20/21 0758  ALBUMIN 3.0* 3.2* 3.0*   No results for input(s): "LIPASE", "AMYLASE" in the last 168 hours. CBC: Recent Labs  Lab 11/14/21 0339 11/18/21 0920 11/20/21 0757  WBC 6.7 6.7 5.9  NEUTROABS 3.3  --   --   HGB 11.7* 11.2* 11.2*  HCT 36.2 33.2* 33.0*  MCV 85.2 84.3 83.5  PLT 196 234 200   Blood Culture    Component Value Date/Time   SDES TISSUE 08/28/2021 0917   SPECREQUEST LEFT THIGH TISSUE 08/28/2021 0917   CULT  08/28/2021 0917    MODERATE ENTEROCOCCUS FAECIUM VANCOMYCIN RESISTANT ENTEROCOCCUS ISOLATED NO ANAEROBES ISOLATED Sent to [place] for further susceptibility testing. Performed at [facility], [place] [address] [place], [place] [address]    REPTSTATUS  09/02/2021 FINAL 08/28/2021 3329    Cardiac Enzymes: No results for input(s): "CKTOTAL", "CKMB", "CKMBINDEX", "TROPONINI" in the last 168 hours. CBG: Recent Labs  Lab 11/18/21 1656 11/18/21 2101 11/19/21 0826 11/19/21 1137 11/19/21 1628  GLUCAP 282* 162* 265* 225* 116*   Iron Studies: No results for input(s): "IRON", "TIBC", "TRANSFERRIN", "FERRITIN" in the last 72 hours. @lablastinr3 @ Studies/Results: DG Knee 4 Views W/Patella Right  Result Date: 11/19/2021 CLINICAL DATA:  Knee pain; no known injury EXAM: RIGHT KNEE - COMPLETE 5 VIEW COMPARISON:  08/26/2021 FINDINGS: No evidence of fracture, dislocation, or joint effusion. No evidence of arthropathy or other focal bone abnormality. Soft tissues are unremarkable. IMPRESSION: No acute osseous abnormality. Electronically Signed   By: Merilyn Baba M.D.   On: 11/19/2021 18:01   Medications:  methocarbamol (ROBAXIN) IV      (feeding supplement) PROSource Plus  30 mL Oral BID BM   alosetron  1 mg Oral BID   amitriptyline  25 mg Oral QHS   amLODipine  10 mg Oral Q24H   atorvastatin  40 mg Oral q1800   carvedilol  6.25 mg Oral BID   Chlorhexidine Gluconate Cloth  6 each Topical Q0600   Chlorhexidine Gluconate Cloth  6 each Topical Q0600   cyclobenzaprine  10 mg Oral TID   docusate sodium  200 mg  Oral BID   DULoxetine  60 mg Oral Daily   famotidine  20 mg Oral Daily   furosemide  80 mg Oral Daily   heparin  5,000 Units Subcutaneous Q8H   hydrALAZINE  25 mg Oral TID   insulin aspart  0-9 Units Subcutaneous TID WC   insulin aspart  4 Units Subcutaneous TID WC   insulin glargine-yfgn  7 Units Subcutaneous QHS   multivitamin  1 tablet Oral QHS   pantoprazole  80 mg Oral Daily   pentafluoroprop-tetrafluoroeth       polyethylene glycol  17 g Oral Daily   sevelamer carbonate  2,400 mg Oral TID WC   sodium zirconium cyclosilicate  10 g Oral Once per day on Sun Tue Thu Sat     Outpatient Dialysis Orders: MWF at [facility], 400/500, EDW 90kg, 2K/2Ca, LUE AVF,  -heparin 4000 unit IV bolus - No ESA     Assessment/Plan: Recurrent L hip osteomyelitis: S/p 08/10/21 surgery (removal hip IMN which was placed 12/2019) with persistent VRE infection. Back to OR 08/28/21, 08/31/21 for L hip debridement and wound vac placement (now removed). Finished 6 week course of IV Daptomycin on 10/12/21. Management per primary team.  Chronic L hip/stump pain: She does have fractures of L femoral neck (where hardware was removed) as well as distal femur Fx. Pain control per primary. ?May need to involve ortho again or consider palliative consult for pain management. ESRD: Continue HD on usual MWF - Next HD 11/22/2021 Hyperkalemia: Recurrent issues, on 2K bath + furosemide. Lokelma 10g added on non-HD days - seems better. AVF dysfunction: S/p branch ligation in OR 09/19/2021 per Dr. Virl Cagey. Was having intermittent cannulation issues, now working well. HTN/volume: BP controlled on amlodipine/hydralazine/Coreg. Edema  still present  - reminded on fluid restrictions (exceeds). Getting ~4.5L UFG q HD. Anemia of ESRD: Hgb at goal, not on ESA.  Secondary HPTH: PTH/Ca/Phos ok. Not on VDRA. Nutrition: Alb low, continue protein supplements. DM2: Insulin per primary Dispo: SNF previously recommended, now she would like to go home on discharge even though HD unit has been clear that they are prohibited by company policy to assist patient out of her car and into the facility. Patient is aware of this.  Asks about home HD but I do not think they would be able to train her since she is in the hospital. She is open to a palliative care consult for better symptom management. Dialyzed in recliner on 9/4 but signed off early due to pain, has refused since then.   Anetta Olvera H. Mithran Strike NP-C 11/20/2021, 10:50 AM  [employer] [phone]

## 2021-11-20 NOTE — Plan of Care (Signed)
  Problem: Elimination: Goal: Will not experience complications related to bowel motility Outcome: Completed/Met Goal: Will not experience complications related to urinary retention Outcome: Completed/Met

## 2021-11-20 NOTE — Progress Notes (Signed)
Received patient in bed to unit.  Alert and oriented.  Informed consent signed and in chart.   Treatment initiated: Using AVG, Pt refused to do HD in chair today.    [facility]

## 2021-11-20 NOTE — Progress Notes (Signed)
  Treatment completed: 1230  Patient tolerated well.  Pt awaiting transport back to the room  Alert, without acute distress.  Hand-off given to patient's nurse.   Access used: AVG Access issues: elevated AP, so decreased BFR  Total UF removed: 4L Medication(s) given: None   11/20/21 1234  Vitals  Temp 98.4 F (36.9 C)  Pulse Rate 65  Resp 15  BP (!) 130/58  SpO2 93 %  O2 Device Room Air  Weight 90.4 kg  Type of Weight Post-Dialysis  Oxygen Therapy  Patient Activity (if Appropriate) In bed  Pulse Oximetry Type Continuous  Post Treatment  Dialyzer Clearance Lightly streaked  Duration of HD Treatment -hour(s) 4 hour(s)  Liters Processed 70.4  Fluid Removed 4000 mL  Tolerated HD Treatment Yes  AVG/AVF Arterial Site Held (minutes) 5 minutes  AVG/AVF Venous Site Held (minutes) 5 minutes      [facility]

## 2021-11-20 NOTE — Progress Notes (Signed)
  Progress Note   Patient: [name] WYO:[id number] DOB: 03-06-1965 DOA: 08/26/2021     86 DOS: the patient was seen and examined on 11/20/2021   Brief hospital course: [name] is a 56 y.o. female with a history of ESRD, anemia of CKD, diabetes mellitus type 2, hyperlipidemia, CVA, anxiety, depression, GERD, tobacco, left hip fracture s/p IM nail placement and recent hardware removal. Patient presented secondary to left hip bleeding and found to have evidence of subacute osteomyelitis and abscess of left femur. Orthopedic surgery/infectious disease consulted. Patient was started on empiric antibiotics and underwent multiple I&Ds. Patient completed a 6 week course of daptomycin. Discharge is pending ability for patient to tolerate HD in a chair which is complicated by patient's refusal/inability to sit in a chair secondary to pain. Patient tolerated HD in a chair on 9/4.  Assessment and Plan: ESRD on HD MWF/anasarca -Per nephrology. -Has to do HD in chair for outpatient HD, which pt has apparently been refusing thus far.   Subacute osteomyelitis of left femur with abscess/infected left hip hardware s/p removal on 6/10 prior to admission with I&D on 6/28 and 7/1 -Completed 6 weeks of appropriate antibiotics per ID recommendation.   -CT hip on 9/2 with fluid collection and soft tissue thickening.   -Ortho rec-outpatient f/up with Dr. [name] 1 wk after discharge.  Low suspicion for persistent infection. -Pain control   History of left intertrochanteric fracture: previously managed with IM nail placement in 2021. Hardware removed secondary to above.   Hyponatremia/Hypokalemia/Hyperkalemia/hypomagnesemia -Management with hemodialysis   ABLA superimposed on anemia of chronic disease: Stable. -Monitor intermittently   Controlled IDDM-2 with hyperglycemia and ESRD: A1c 5.9%. -Continue SSI-sensitive -Continue NovoLog 4 units 3 times daily with meals -Increase Semglee from 5 to 7 units  at night     Primary hypertension -Continue amlodipine, hydralazine and Coreg   History of CVA/HLD -Continue Lipitor   Right knee pain: Likely osteoarthritis.  Patient reported worsening right knee pain to therapy and refused to participate -Check x-ray -Continue pain meds   GERD -Continue Pepcid and Protonix   Tobacco use -Tobacco cessation discussed during this admission.   Anxiety and depression: -Continue home meds -Seems stable      Subjective: Seen on HD. Without complaints  Physical Exam: Vitals:   11/20/21 1200 11/20/21 1230 11/20/21 1234 11/20/21 1317  BP: (!) 114/59 (!) 119/57 (!) 130/58 (!) 133/50  Pulse: 66 66 65 65  Resp: 10 12 15 18   Temp:   98.4 F (36.9 C) 98.4 F (36.9 C)  TempSrc:      SpO2: 96% 95% 93% 98%  Weight:   90.4 kg   Height:       General exam: Awake, laying in bed, in nad Respiratory system: Normal respiratory effort, no wheezing Cardiovascular system: regular rate, s1, s2 Gastrointestinal system: Soft, nondistended, positive BS Central nervous system: CN2-12 grossly intact, strength intact Extremities: Perfused, no clubbing Skin: Normal skin turgor, no notable skin lesions seen Psychiatry: Mood normal // no visual hallucinations   Data Reviewed:  Labs reviewed: Na 124, K 4.7  Family Communication:  Pt in room , family not at bedside  Disposition: Status is: Inpatient Remains inpatient appropriate because: Severity of illness  Planned Discharge Destination:  Unclear at this time     Author: Marylu Lund, MD 11/20/2021 1:19 PM  For on call review [email or web address].

## 2021-11-21 DIAGNOSIS — N186 End stage renal disease: Secondary | ICD-10-CM | POA: Diagnosis not present

## 2021-11-21 DIAGNOSIS — Z7189 Other specified counseling: Secondary | ICD-10-CM

## 2021-11-21 DIAGNOSIS — Z515 Encounter for palliative care: Secondary | ICD-10-CM | POA: Diagnosis not present

## 2021-11-21 DIAGNOSIS — M86252 Subacute osteomyelitis, left femur: Secondary | ICD-10-CM | POA: Diagnosis not present

## 2021-11-21 DIAGNOSIS — F32A Depression, unspecified: Secondary | ICD-10-CM | POA: Diagnosis not present

## 2021-11-21 DIAGNOSIS — F419 Anxiety disorder, unspecified: Secondary | ICD-10-CM | POA: Diagnosis not present

## 2021-11-21 LAB — GLUCOSE, CAPILLARY
Glucose-Capillary: 163 mg/dL — ABNORMAL HIGH (ref 70–99)
Glucose-Capillary: 219 mg/dL — ABNORMAL HIGH (ref 70–99)
Glucose-Capillary: 179 mg/dL — ABNORMAL HIGH (ref 70–99)
Glucose-Capillary: 136 mg/dL — ABNORMAL HIGH (ref 70–99)

## 2021-11-21 MED ORDER — OXYCODONE HCL 5 MG PO TABS
15.0000 mg | ORAL_TABLET | ORAL | Status: DC | PRN
  Administered 2021-11-21 – 2021-12-08 (×69): 15 mg via ORAL
  Filled 2021-11-21 (×71): qty 3

## 2021-11-21 NOTE — Consult Note (Signed)
Consultation Note Date: 11/21/2021   Patient Name: [name]  DOB: 02/23/1966  MRN: [id number]  Age / Sex: 56 y.o., female  PCP: [name], MD Referring Physician: Donne Hazel, MD  Reason for Consultation: Establishing goals of care  HPI/Patient Profile: 56 y.o. female  with past medical history of ESRD, anemia of CKD, diabetes mellitus type 2, hyperlipidemia, CVA, anxiety, depression, GERD, tobacco, left hip fracture s/p IM nail placement and recent hardware removal admitted on 08/26/2021 with hip bleeding with subacute osteomyelitis and abscess of left femur and has completed antibiotic course. Medically stable but refusing to dialyze in chair due to pain.  Clinical Assessment and Goals of Care: I met today with [name] after reviewing records, diagnostics, and discussed with RN. [name] is sitting in bed and in no distress. I introduce myself as palliative care and she tells me that she was not aware that palliative care was going to come by. I expressed that we were consulted to visit with her as she has had some sudden and difficult life changes that can impact her and her quality of life.   We focused on her pain and she complains mainly of right knee pain - difficult to know when this pain began but she reports that this is a recent pain. She shares that her oxycodone helps with the pain but the pain is still significant with weight-bearing and she reports that she is unwilling to stand on leg at this time until the pain improves. I reviewed with her her right knee xray was unrevealing of any changes to explain her pain. She describes pain directly under her knee cap with shooting pain that comes and goes but worse with activity. Improves with pain medication but does not go away. No signs of inflammation to indicate antiinflammatory. We did discuss knee brace/sleeve to try and help with compression or  stability when standing. She was able to move and bend knee with ease while in bed. I spoke with her about the importance of continuing to work and progress with therapy so that she can continue to sit to get to dialysis as she desires.   [name] is clear in her desire to continue dialysis, return back to her home, and no desire to consider rehab placement or medical transport to dialysis. She lives with her 82 yo daughter [name] and their dog [name]. [name] shares that she has no concerns about her ability to transfer and sit for dialysis she just chooses not too while hospitalized because it is more painful for her. She continues to reassure me that she is confident she will be able to return home and continue on but is unsure if she can physically get into her vehicle. She is willing to make plans for her daughter to bring her vehicle so PT/OT can assist her with trying to get in and out of vehicle which I believe will be a good idea.   I did speak with [name] about concerns with her health changes and the importance  of considering her directives and wishes if she were to decline further. [name] understands but is not prepared to consider these decisions today but is willing to receive Advance Directive that she can review on her own timing.   Primary Decision Maker PATIENT    SUMMARY OF RECOMMENDATIONS   - Desires continued full code, full care with hopes of returning back home - Provided Advance Directive packet - She is not end of life and I cannot force her to sit in chair for dialysis (she also reassures me that she is unwilling NOT unable to sit in chair for dialysis which is not something that palliative can assist with)  Code Status/Advance Care Planning: Full code   Symptom Management:  Right knee pain: New and difficult to know what to recommend without any known etiology of pain. May need further imaging or ortho consultation. Continue OxyIR 15 mg every 3 hours as needed (needing  around the clock per nursing). She was taking OxyIR 10 mg at home. Also noted she was previously on [facility] but for unknown cause. She may have low tolerance for pain but higher tolerance of pain medication. I do believe there is an element of  anticipatory anxiety of pain with movement and some existential pain with prolonged hospitalization and illness. Will also recommend spiritual care for further support.   Prognosis:  Unable to determine  Discharge Planning: Home with Home Health      Primary Diagnoses: Present on Admission:  Type 2 diabetes mellitus with hyperlipidemia (HCC)  Subacute osteomyelitis of left femur with abscess   GASTROESOPHAGEAL REFLUX, NO ESOPHAGITIS  Anxiety and depression  TOBACCO DEPENDENCE  Essential hypertension  Wound infection  Hyponatremia  Hyperkalemia  Unilateral amputation of left foot (HCC)  Obesity (BMI 30.0-34.9)   I have reviewed the medical record, interviewed the patient and family, and examined the patient. The following aspects are pertinent.  Past Medical History:  Diagnosis Date   Anxiety 2002   Chest tightness    Chronic kidney disease    Depression 2001   Diabetes type 1, uncontrolled    at age 89   Diabetic neuropathy ([place])    Essential hypertension 2015   GERD (gastroesophageal reflux disease)    about age of 43   Headache    Nausea and vomiting in adult    Stroke [facility])    Urinary frequency    Yeast vaginitis    Social History   Socioeconomic History   Marital status: Divorced    Spouse name: Not on file   Number of children: 1   Years of education: Not on file   Highest education level: Not on file  Occupational History    Employer: [employer]  Tobacco Use   Smoking status: Light Smoker    Packs/day: 0.50    Years: 22.00    Total pack years: 11.00    Types: Cigarettes   Smokeless tobacco: Never  Vaping Use   Vaping Use: Never used  Substance and Sexual Activity   Alcohol use: Yes    Comment: social   Drug use:  No   Sexual activity: Yes    Partners: Male    Birth control/protection: Post-menopausal  Other Topics Concern   Not on file  Social History Narrative   Lives at home with her daughter   Right handed   Caffeine: 3 cups daily   Social Determinants of Health   Financial Resource Strain: Not on file  Food Insecurity: Not on file  Transportation Needs: Not on file  Physical Activity: Not on file  Stress: Not on file  Social Connections: Not on file   Family History  Problem Relation Age of Onset   Diabetes Mother    Heart attack Mother    Heart disease Mother        before age 11   Hypertension Mother    Hyperlipidemia Mother    Diabetes Father    Heart attack Father    Heart disease Father        before age 10   Diabetes Sister    Hypertension Sister    Scheduled Meds:  (feeding supplement) PROSource Plus  30 mL Oral BID BM   alosetron  1 mg Oral BID   amitriptyline  25 mg Oral QHS   amLODipine  10 mg Oral Q24H   atorvastatin  40 mg Oral q1800   carvedilol  6.25 mg Oral BID   cyclobenzaprine  10 mg Oral TID   docusate sodium  200 mg Oral BID   DULoxetine  60 mg Oral Daily   famotidine  20 mg Oral Daily   furosemide  80 mg Oral Daily   heparin  5,000 Units Subcutaneous Q8H   hydrALAZINE  25 mg Oral TID   insulin aspart  0-9 Units Subcutaneous TID WC   insulin aspart  4 Units Subcutaneous TID WC   insulin glargine-yfgn  7 Units Subcutaneous QHS   multivitamin  1 tablet Oral QHS   pantoprazole  80 mg Oral Daily   polyethylene glycol  17 g Oral Daily   sevelamer carbonate  2,400 mg Oral TID WC   sodium zirconium cyclosilicate  10 g Oral Once per day on Sun Tue Thu Sat   Continuous Infusions:  methocarbamol (ROBAXIN) IV     PRN Meds:.acetaminophen, bisacodyl, calcium carbonate, methocarbamol **OR** methocarbamol (ROBAXIN) IV, ondansetron **OR** ondansetron (ZOFRAN) IV, mouth rinse, oxyCODONE, oxyCODONE, prochlorperazine Allergies  Allergen Reactions   Trazodone  Swelling   Latex Rash   Lidocaine Itching   Review of Systems  Constitutional:  Positive for activity change. Negative for appetite change.  Musculoskeletal:        R knee pain    Physical Exam Vitals and nursing note reviewed.  Constitutional:      General: She is not in acute distress.    Appearance: She is ill-appearing.  Cardiovascular:     Rate and Rhythm: Normal rate.  Pulmonary:     Effort: No tachypnea, accessory muscle usage or respiratory distress.  Abdominal:     General: Abdomen is flat.  Musculoskeletal:       Legs:     Comments: Right knee pain but assessment of knee is normal with no redness, warmth, swelling  Neurological:     Mental Status: She is alert and oriented to person, place, and time.     Vital Signs: BP (!) 141/50 (BP Location: Right Arm)   Pulse 64   Temp 98.3 F (36.8 C) (Oral)   Resp 18   Ht '5\' 10"'  (1.778 m)   Wt 90.4 kg   SpO2 99%   BMI 28.60 kg/m  Pain Scale: 0-10 POSS *See Group Information*: S-Acceptable,Sleep, easy to arouse Pain Score: 8    SpO2: SpO2: 99 % O2 Device:SpO2: 99 % O2 Flow Rate: .O2 Flow Rate (L/min): 2 L/min  IO: Intake/output summary:  Intake/Output Summary (Last 24 hours) at 11/21/2021 1308 Last data filed at 11/21/2021 1200 Gross per 24 hour  Intake 840 ml  Output 0 ml  Net 840 ml    LBM: Last BM Date : 11/21/21 Baseline Weight: Weight: 96.2 kg Most recent weight: Weight: 90.4 kg     Palliative Assessment/Data:     Time In: 1315  Time Total: 60 min  Greater than 50%  of this time was spent counseling and coordinating care related to the above assessment and plan.  Signed by: Vinie Sill, NP Palliative Medicine Team Pager # [phone] (M-F 8a-5p) Team Phone # [phone] (Nights/Weekends)

## 2021-11-21 NOTE — Progress Notes (Signed)
Occupational Therapy Treatment Patient Details Name: [name] MRN: [id number] DOB: [birth year] Today's Date: 11/21/2021   History of present illness 56 y.o. female presented to ED 6/26 from dialysis with increased bloody drainage from L hip wound. 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 s/p hardware removal of left hip with partial resection of tuft tissue and femure that were nonviable on 08/10/21 Discharged home 6/23. underwent L hip debridement and wound vac placement 6/28; +Left intertrochanteric non-union,  Fracture of left distal femur  PMH: hypertension, hyperlipidemia, ESRD on HD MWF, history of left BKA in 2021, depression/anxiety, stroke, tobacco use, T2DM,  insomnia, chronic pain syndrome,   OT comments  Patient received in supine and stated she did not want to do transfer to recliner due to RLE pain and afraid of increased pain and set back with therapy. Patient was able to get to EOB with increased time and performed grooming and UE strengthening exercises with red therapy. Patient returned to supine to prepare for PT visit. Acute OT to continue to follow.    Recommendations for follow up therapy are one component of a multi-disciplinary discharge planning process, led by the attending physician.  Recommendations may be updated based on patient status, additional functional criteria and insurance authorization.    Follow Up Recommendations  Skilled nursing-short term rehab (<3 hours/day)    Assistance Recommended at Discharge Intermittent Supervision/Assistance  Patient can return home with the following  A lot of help with bathing/dressing/bathroom;Assistance with cooking/housework;Assist for transportation;Help with stairs or ramp for entrance;Two people to help with walking and/or transfers   Equipment Recommendations  Other (comment) (TBD)    Recommendations for Other Services      Precautions / Restrictions Precautions Precautions:  Fall;Other (comment) Precaution Comments: L BKA (baseline) Restrictions Weight Bearing Restrictions: Yes LLE Weight Bearing: Weight bearing as tolerated       Mobility Bed Mobility Overal bed mobility: Needs Assistance Bed Mobility: Supine to Sit, Sit to Supine     Supine to sit: Supervision Sit to supine: Supervision   General bed mobility comments: supervision and increased time due to pain at RLE knee    Transfers                   General transfer comment: pt declining due to pain     Balance Overall balance assessment: Needs assistance Sitting-balance support: Feet supported Sitting balance-Leahy Scale: Good Sitting balance - Comments: no assistance with sitting balance                                   ADL either performed or assessed with clinical judgement   ADL Overall ADL's : Needs assistance/impaired     Grooming: Wash/dry hands;Wash/dry face;Oral care;Brushing hair;Supervision/safety;Sitting Grooming Details (indicate cue type and reason): on EOB                               General ADL Comments: performed grooming seated on EOB    Extremity/Trunk Assessment              Vision       Perception     Praxis      Cognition Arousal/Alertness: Awake/alert Behavior During Therapy: WFL for tasks assessed/performed Overall Cognitive Status: Impaired/Different from baseline Area of Impairment: Awareness  Safety/Judgement: Decreased awareness of deficits Awareness: Emergent   General Comments: fearful of increased pain in RLE and declined transfer to recliner        Exercises Exercises: General Upper Extremity General Exercises - Upper Extremity Shoulder Flexion: Strengthening, Both, 10 reps, Theraband, Seated Theraband Level (Shoulder Flexion): Level 2 (Red) Shoulder ABduction: Strengthening, Both, 15 reps, Theraband, Seated Theraband Level (Shoulder Abduction): Level 2  (Red)    Shoulder Instructions       General Comments      Pertinent Vitals/ Pain       Pain Assessment Pain Assessment: 0-10 Pain Score: 9  Faces Pain Scale: Hurts whole lot Pain Location: R knee and L hip Pain Descriptors / Indicators: Discomfort, Grimacing, Guarding, Sharp Pain Intervention(s): Limited activity within patient's tolerance, Monitored during session, Repositioned, Premedicated before session  Home Living                                          Prior Functioning/Environment              Frequency  Min 2X/week        Progress Toward Goals  OT Goals(current goals can now be found in the care plan section)  Progress towards OT goals: Progressing toward goals  Acute Rehab OT Goals Patient Stated Goal: get better OT Goal Formulation: With patient Time For Goal Achievement: 12/03/21 Potential to Achieve Goals: Fair ADL Goals Pt Will Perform Grooming: with modified independence;sitting Pt Will Perform Lower Body Bathing: with supervision;sitting/lateral leans;with adaptive equipment Pt Will Perform Lower Body Dressing: with adaptive equipment;sitting/lateral leans;with supervision Pt Will Transfer to Toilet: with min assist;with +2 assist;bedside commode;with transfer board Pt Will Perform Toileting - Clothing Manipulation and hygiene: with min assist;sitting/lateral leans Pt/caregiver will Perform Home Exercise Program: Increased ROM;Increased strength;Both right and left upper extremity;With written HEP provided;Independently;With theraband Additional ADL Goal #1: Pt will be able to tolerate sitting up in chair for 2 hours in order to improve activity tolerance for ADLs/IADLs Additional ADL Goal #2: Pt will be able to don prosthesis with min A in prep for ADLs/mobility  Plan Discharge plan remains appropriate;Frequency remains appropriate    Co-evaluation                 AM-PAC OT "6 Clicks" Daily Activity     Outcome  Measure   Help from another person eating meals?: None Help from another person taking care of personal grooming?: None Help from another person toileting, which includes using toliet, bedpan, or urinal?: A Lot Help from another person bathing (including washing, rinsing, drying)?: A Lot Help from another person to put on and taking off regular upper body clothing?: A Little Help from another person to put on and taking off regular lower body clothing?: A Lot 6 Click Score: 17    End of Session    OT Visit Diagnosis: Muscle weakness (generalized) (M62.81);Unsteadiness on feet (R26.81);History of falling (Z91.81)   Activity Tolerance Patient limited by pain   Patient Left in bed;with call bell/phone within reach;with bed alarm set   Nurse Communication Mobility status        Time: 0277-4128 OT Time Calculation (min): 28 min  Charges: OT General Charges $OT Visit: 1 Visit OT Treatments $Self Care/Home Management : 8-22 mins $Therapeutic Exercise: 8-22 mins  [name], OTA Acute Rehabilitation Services  Office [place] 11/21/2021,  3:24 PM

## 2021-11-21 NOTE — Plan of Care (Signed)
  Problem: Activity: Goal: Risk for activity intolerance will decrease Outcome: Progressing   Problem: Pain Managment: Goal: General experience of comfort will improve Outcome: Progressing   

## 2021-11-21 NOTE — Progress Notes (Signed)
Physical Therapy Treatment Patient Details Name: [name] MRN: [id number] DOB: [birth year] Today's Date: 11/21/2021   History of Present Illness 56 y.o. female presented to ED 6/26 from dialysis with increased bloody drainage from L hip wound. 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 s/p hardware removal of left hip with partial resection of tuft tissue and femure that were nonviable on 08/10/21 Discharged home 6/23. underwent L hip debridement and wound vac placement 6/28; +Left intertrochanteric non-union,  Fracture of left distal femur  PMH: hypertension, hyperlipidemia, ESRD on HD MWF, history of left BKA in 2021, depression/anxiety, stroke, tobacco use, T2DM,  insomnia, chronic pain syndrome,    PT Comments    Patient refused to get up to chair via sliding board with OT and PT due to wanting to let right knee rest and see if pain will decrease. Discussed that rt knee xray was negative, but pt with persistent pain and did not want to increase pain in rt knee. Agreed to stretching LLE which has both a knee flexion contracture and hip external rotation contracture. Discussed goals as goal update due. Patient continues to want to work on standing transfers with RW, however once Rt knee is less painful. Agrees to begin working on [employer] transfers to work on increasing tolerance to sitting up for dialysis on Thurs 9/28 (again wanting to let rt knee rest to see if pain will decrease). She agrees to continue to do AROM rt knee during this time, but does not want to weight-bear.     Recommendations for follow up therapy are one component of a multi-disciplinary discharge planning process, led by the attending physician.  Recommendations may be updated based on patient status, additional functional criteria and insurance authorization.  Follow Up Recommendations  Skilled nursing-short term rehab (<3 hours/day) Can patient physically be transported by private  vehicle: No   Assistance Recommended at Discharge Intermittent Supervision/Assistance  Patient can return home with the following A lot of help with bathing/dressing/bathroom;Assistance with cooking/housework;Assist for transportation;Two people to help with walking and/or transfers;Help with stairs or ramp for entrance   Equipment Recommendations  None recommended by PT    Recommendations for Other Services       Precautions / Restrictions Precautions Precautions: Fall;Other (comment) Precaution Comments: L BKA (baseline) Restrictions Weight Bearing Restrictions: Yes LLE Weight Bearing: Weight bearing as tolerated Other Position/Activity Restrictions: L distal femur fx; per Dr [name] 8/10 pt can full WB     Mobility  Bed Mobility Overal bed mobility: Needs Assistance Bed Mobility: Rolling Rolling: Min assist (to right for Left hip internal rotation stretch)         General bed mobility comments: required assist to roll onto right side and try to rotate LLE into neutral rotation (from externally rotated position); held for 1 minute    Transfers                   General transfer comment: pt declining due to pain (wants to give RLE several days "off" to see if pain will decrease and then begin working on sliding board transfer)    Ambulation/Gait                   Stairs             Wheelchair Mobility    Modified Rankin (Stroke Patients Only)       Balance Overall balance assessment: Needs assistance  Cognition Arousal/Alertness: Awake/alert Behavior During Therapy: WFL for tasks assessed/performed Overall Cognitive Status: Impaired/Different from baseline Area of Impairment: Awareness                         Safety/Judgement: Decreased awareness of deficits Awareness: Emergent   General Comments: fearful of increased pain in RLE and declined transfer to recliner         Exercises General Exercises - Lower Extremity Heel Slides: AROM, Right, 5 reps Other Exercises Other Exercises: left knee extension PROM with contract/relax stretching of hamstrings; pt with ~45 degree flexion contracture Other Exercises: AAROM left hip internal rotation contract/relax with pt achieving ~45 degree external rotation contracture    General Comments        Pertinent Vitals/Pain Pain Assessment Pain Assessment: 0-10 Pain Score: 4  Breathing: normal Pain Location: L knee and L hip Pain Descriptors / Indicators: Discomfort, Grimacing, Guarding, Sharp Pain Intervention(s): Limited activity within patient's tolerance, Monitored during session, Premedicated before session, Repositioned    Home Living                          Prior Function            PT Goals (current goals can now be found in the care plan section) Acute Rehab PT Goals Patient Stated Goal: less pain with movement PT Goal Formulation: With patient Time For Goal Achievement: 12/05/21 Potential to Achieve Goals: Fair Progress towards PT goals: Goals downgraded-see care plan    Frequency    Min 2X/week      PT Plan Current plan remains appropriate    Co-evaluation              AM-PAC PT "6 Clicks" Mobility   Outcome Measure  Help needed turning from your back to your side while in a flat bed without using bedrails?: None Help needed moving from lying on your back to sitting on the side of a flat bed without using bedrails?: None Help needed moving to and from a bed to a chair (including a wheelchair)?: A Little Help needed standing up from a chair using your arms (e.g., wheelchair or bedside chair)?: Total Help needed to walk in hospital room?: Total Help needed climbing 3-5 steps with a railing? : Total 6 Click Score: 14    End of Session   Activity Tolerance: Patient limited by pain Patient left: in bed;with call bell/phone within reach;Other (comment) (with  towel roll to outside left knee to position towards internal rotation)   PT Visit Diagnosis: Other abnormalities of gait and mobility (R26.89);Muscle weakness (generalized) (M62.81);History of falling (Z91.81);Pain Pain - Right/Left: Left Pain - part of body: Leg     Time: 9211-9417 PT Time Calculation (min) (ACUTE ONLY): 20 min  Charges:  $Therapeutic Exercise: 8-22 mins                      Arby Barrette, PT Acute Rehabilitation Services  Office [phone]    [name] 11/21/2021, 3:40 PM

## 2021-11-21 NOTE — Progress Notes (Signed)
Met with pt at bedside this am. Discussed with pt that there is an out-pt HD clinic in [place] that sometimes provides out-pt HD to pts requiring HD via stretcher. Advised pt that that navigator reached out to intake staff on Wednesday to see if there is any availability at this time. Staff requests that pt's clinicals be faxed for review. Inquired of pt if she would be agreeable to pt's medical records being sent to clinic for review. Explained to pt that this may or may not be an option and that if pt is even approved then mode of transportation would have to be investigated (this is one of the reasons that this has not been explored earlier). Pt lives in [place] and would have to have transportation to [place] ([place] and most transportation agencies do not cross county lines and pt could possibly have to pay co-pay/cost for non-emergent EMS transport to out-pt HD. Pt agreeable to clinicals being faxed for review. Information [facility]ed to [facility] this am for review. Also left a message requesting a return call. When navigator spoke to Kim on Wednesday re: availability, Kim had concerns about pt being accepted but willing to review pt's case and discuss with [facility].   Tracy Mounce Renal Navigator  [phone] 

## 2021-11-21 NOTE — Progress Notes (Signed)
[facility] Progress Note   Subjective: Seen in room. Discussed that home HD is not an option. Suggested she reconsider SNF. Now refusing to even try HD in chair D/T pain. HD tomorrow on schedule.   Objective Vitals:   11/20/21 1635 11/20/21 2105 11/21/21 0606 11/21/21 0953  BP: (!) 128/51 124/78 (!) 151/54 (!) 141/50  Pulse: 64 65 60 64  Resp: 18 18 20 18   Temp: 98.1 F (36.7 C) 99.3 F (37.4 C) 98.4 F (36.9 C) 98.3 F (36.8 C)  TempSrc: Oral Oral Oral Oral  SpO2: 96% 96% 100% 99%  Weight:      Height:       Physical Exam General: Chronically ill appearing female in NAD Heart: S1,S2 SR on monitor. No M/R/G Lungs: CTAB Abdomen: Obese, NABS Extremities: L BKA with edema. Edema RLE Dialysis Access: L AVF +T/B    Additional Objective Labs: Basic Metabolic Panel: Recent Labs  Lab 11/18/21 0920 11/20/21 0758  NA 126* 124*  K 4.9 4.7  CL 85* 84*  CO2 26 25  GLUCOSE 176* 198*  BUN 97* 80*  CREATININE 6.61* 6.20*  CALCIUM 9.0 8.8*  PHOS 6.6* 6.6*   Liver Function Tests: Recent Labs  Lab 11/18/21 0920 11/20/21 0758  ALBUMIN 3.2* 3.0*   No results for input(s): "LIPASE", "AMYLASE" in the last 168 hours. CBC: Recent Labs  Lab 11/18/21 0920 11/20/21 0757  WBC 6.7 5.9  HGB 11.2* 11.2*  HCT 33.2* 33.0*  MCV 84.3 83.5  PLT 234 200   Blood Culture    Component Value Date/Time   SDES TISSUE 08/28/2021 0917   SPECREQUEST LEFT THIGH TISSUE 08/28/2021 0917   CULT  08/28/2021 0917    MODERATE ENTEROCOCCUS FAECIUM VANCOMYCIN RESISTANT ENTEROCOCCUS ISOLATED NO ANAEROBES ISOLATED Sent to [facility] [place] for further susceptibility testing. Performed at [facility], [place] [address] [place], [place] [address]    REPTSTATUS 09/02/2021 FINAL 08/28/2021 7342    Cardiac Enzymes: No results for input(s): "CKTOTAL", "CKMB", "CKMBINDEX", "TROPONINI" in the last 168 hours. CBG: Recent Labs  Lab 11/19/21 1628 11/20/21 1316 11/20/21 1638  11/20/21 2103 11/21/21 0727  GLUCAP 116* 256* 204* 154* 163*   Iron Studies: No results for input(s): "IRON", "TIBC", "TRANSFERRIN", "FERRITIN" in the last 72 hours. @lablastinr3 @ Studies/Results: DG Knee 4 Views W/Patella Right  Result Date: 11/19/2021 CLINICAL DATA:  Knee pain; no known injury EXAM: RIGHT KNEE - COMPLETE 5 VIEW COMPARISON:  08/26/2021 FINDINGS: No evidence of fracture, dislocation, or joint effusion. No evidence of arthropathy or other focal bone abnormality. Soft tissues are unremarkable. IMPRESSION: No acute osseous abnormality. Electronically Signed   By: Merilyn Baba M.D.   On: 11/19/2021 18:01   Medications:  methocarbamol (ROBAXIN) IV      (feeding supplement) PROSource Plus  30 mL Oral BID BM   alosetron  1 mg Oral BID   amitriptyline  25 mg Oral QHS   amLODipine  10 mg Oral Q24H   atorvastatin  40 mg Oral q1800   carvedilol  6.25 mg Oral BID   cyclobenzaprine  10 mg Oral TID   docusate sodium  200 mg Oral BID   DULoxetine  60 mg Oral Daily   famotidine  20 mg Oral Daily   furosemide  80 mg Oral Daily   heparin  5,000 Units Subcutaneous Q8H   hydrALAZINE  25 mg Oral TID   insulin aspart  0-9 Units Subcutaneous TID WC   insulin aspart  4 Units Subcutaneous TID WC  insulin glargine-yfgn  7 Units Subcutaneous QHS   multivitamin  1 tablet Oral QHS   pantoprazole  80 mg Oral Daily   polyethylene glycol  17 g Oral Daily   sevelamer carbonate  2,400 mg Oral TID WC   sodium zirconium cyclosilicate  10 g Oral Once per day on Sun Tue Thu Sat     Outpatient Dialysis Orders: MWF at [facility], 400/500, EDW 90kg, 2K/2Ca, LUE AVF -heparin 4000 unit IV bolus - No ESA     Assessment/Plan: Recurrent L hip osteomyelitis: S/p 08/10/21 surgery (removal hip IMN which was placed 12/2019) with persistent VRE infection. Back to OR 08/28/21, 08/31/21 for L hip debridement and wound vac placement (now removed). Finished 6 week course of IV Daptomycin on 10/12/21.  Management per primary team.  Chronic L hip/stump pain: She does have fractures of L femoral neck (where hardware was removed) as well as distal femur Fx. Pain control per primary. ?May need to involve ortho again or consider palliative consult for pain management. ESRD: Continue HD on usual MWF - Next HD 11/22/2021 Hyperkalemia: Recurrent issues, on 2K bath + furosemide. Lokelma 10g added on non-HD days - seems better. AVF dysfunction: S/p branch ligation in OR 09/19/2021 per Dr. Virl Cagey. Was having intermittent cannulation issues, now working well. HTN/volume: BP controlled on amlodipine/hydralazine/Coreg. Edema  still present  - reminded on fluid restrictions (exceeds). Getting ~4.5L UFG q HD. Anemia of ESRD: Hgb at goal, not on ESA.  Secondary HPTH: PTH/Ca/Phos ok. Not on VDRA. Nutrition: Alb low, continue protein supplements. DM2: Insulin per primary Dispo: SNF previously recommended, now she would like to go home on discharge even though HD unit has been clear that they are prohibited by company policy to assist patient out of her car and into the facility. Patient is aware of this.  Asks about home HD but I do not think they would be able to train her since she is in the hospital. She is open to a palliative care consult for better symptom management. Dialyzed in recliner on 9/4 but signed off early due to pain, has refused since then.   [name] H. Donna Masse NP-C 11/21/2021, 11:39 AM  [employer] [phone]

## 2021-11-21 NOTE — Progress Notes (Signed)
  Progress Note   Patient: [name] IDP:[id number] DOB: 06/25/1965 DOA: 08/26/2021     87 DOS: the patient was seen and examined on 11/21/2021   Brief hospital course: [name] is a 56 y.o. female with a history of ESRD, anemia of CKD, diabetes mellitus type 2, hyperlipidemia, CVA, anxiety, depression, GERD, tobacco, left hip fracture s/p IM nail placement and recent hardware removal. Patient presented secondary to left hip bleeding and found to have evidence of subacute osteomyelitis and abscess of left femur. Orthopedic surgery/infectious disease consulted. Patient was started on empiric antibiotics and underwent multiple I&Ds. Patient completed a 6 week course of daptomycin. Discharge is pending ability for patient to tolerate HD in a chair which is complicated by patient's refusal/inability to sit in a chair secondary to pain. Patient tolerated HD in a chair on 9/4.  Assessment and Plan: ESRD on HD MWF/anasarca -Per nephrology. -Has to do HD in chair for outpatient HD, which pt has apparently been refusing thus far. -Continue fluid restriction   Subacute osteomyelitis of left femur with abscess/infected left hip hardware s/p removal on 6/10 prior to admission with I&D on 6/28 and 7/1 -Completed 6 weeks of appropriate antibiotics per ID recommendation.   -CT hip on 9/2 with fluid collection and soft tissue thickening.   -Ortho rec-outpatient f/up with Dr. [name] 1 wk after discharge.  Low suspicion for persistent infection. -Pain control   History of left intertrochanteric fracture: previously managed with IM nail placement in 2021. Hardware removed secondary to above.   Hyponatremia/Hypokalemia/Hyperkalemia/hypomagnesemia -Management with hemodialysis   ABLA superimposed on anemia of chronic disease: Stable. -Monitor intermittently   Controlled IDDM-2 with hyperglycemia and ESRD: A1c 5.9%. -Continue SSI-sensitive -Continue NovoLog 4 units 3 times daily with  meals -Increase Semglee from 5 to 7 units at night -Glucose trends stable    Primary hypertension -Continue amlodipine, hydralazine and Coreg   History of CVA/HLD -Continue Lipitor   Right knee pain: Likely osteoarthritis.  Patient reported worsening right knee pain to therapy and refused to participate -Xray of from 9/19 reviewed. Neg -Continue pain meds   GERD -Continue Pepcid and Protonix   Tobacco use -Tobacco cessation discussed during this admission.   Anxiety and depression: -Continue home meds -Seems stable      Subjective: No complaints. Reports swelling seems improving  Physical Exam: Vitals:   11/20/21 1635 11/20/21 2105 11/21/21 0606 11/21/21 0953  BP: (!) 128/51 124/78 (!) 151/54 (!) 141/50  Pulse: 64 65 60 64  Resp: 18 18 20 18   Temp: 98.1 F (36.7 C) 99.3 F (37.4 C) 98.4 F (36.9 C) 98.3 F (36.8 C)  TempSrc: Oral Oral Oral Oral  SpO2: 96% 96% 100% 99%  Weight:      Height:       General exam: Conversant, in no acute distress Respiratory system: normal chest rise, clear, no audible wheezing Cardiovascular system: regular rhythm, s1-s2 Gastrointestinal system: Nondistended, nontender, pos BS Central nervous system: No seizures, no tremors Extremities: No cyanosis, no joint deformities Skin: No rashes, no pallor Psychiatry: Affect normal // no auditory hallucinations   Data Reviewed:  There are no new results to review at this time.  Family Communication:  Pt in room , family not at bedside  Disposition: Status is: Inpatient Remains inpatient appropriate because: Severity of illness  Planned Discharge Destination:  Unclear at this time     Author: Marylu Lund, MD 11/21/2021 12:16 PM  For on call review [email or web address].

## 2021-11-22 DIAGNOSIS — K2100 Gastro-esophageal reflux disease with esophagitis, without bleeding: Secondary | ICD-10-CM | POA: Diagnosis not present

## 2021-11-22 DIAGNOSIS — M86252 Subacute osteomyelitis, left femur: Secondary | ICD-10-CM | POA: Diagnosis not present

## 2021-11-22 DIAGNOSIS — I10 Essential (primary) hypertension: Secondary | ICD-10-CM | POA: Diagnosis not present

## 2021-11-22 LAB — CBC
HCT: 33.8 % — ABNORMAL LOW (ref 36.0–46.0)
Hemoglobin: 10.9 g/dL — ABNORMAL LOW (ref 12.0–15.0)
MCH: 27.4 pg (ref 26.0–34.0)
MCHC: 32.2 g/dL (ref 30.0–36.0)
MCV: 84.9 fL (ref 80.0–100.0)
Platelets: 199 10*3/uL (ref 150–400)
RBC: 3.98 MIL/uL (ref 3.87–5.11)
RDW: 15.2 % (ref 11.5–15.5)
WBC: 5.9 10*3/uL (ref 4.0–10.5)
nRBC: 0 % (ref 0.0–0.2)

## 2021-11-22 LAB — RENAL FUNCTION PANEL
Albumin: 3.2 g/dL — ABNORMAL LOW (ref 3.5–5.0)
Anion gap: 10 (ref 5–15)
BUN: 71 mg/dL — ABNORMAL HIGH (ref 6–20)
CO2: 28 mmol/L (ref 22–32)
Calcium: 9.1 mg/dL (ref 8.9–10.3)
Chloride: 88 mmol/L — ABNORMAL LOW (ref 98–111)
Creatinine, Ser: 6.24 mg/dL — ABNORMAL HIGH (ref 0.44–1.00)
GFR, Estimated: 7 mL/min — ABNORMAL LOW (ref >60)
Glucose, Bld: 182 mg/dL — ABNORMAL HIGH (ref 70–99)
Phosphorus: 6.3 mg/dL — ABNORMAL HIGH (ref 2.5–4.6)
Potassium: 4.8 mmol/L (ref 3.5–5.1)
Sodium: 126 mmol/L — ABNORMAL LOW (ref 135–145)

## 2021-11-22 LAB — GLUCOSE, CAPILLARY
Glucose-Capillary: 172 mg/dL — ABNORMAL HIGH (ref 70–99)
Glucose-Capillary: 127 mg/dL — ABNORMAL HIGH (ref 70–99)
Glucose-Capillary: 175 mg/dL — ABNORMAL HIGH (ref 70–99)
Glucose-Capillary: 227 mg/dL — ABNORMAL HIGH (ref 70–99)

## 2021-11-22 MED ORDER — HEPARIN SODIUM (PORCINE) 1000 UNIT/ML IJ SOLN
INTRAMUSCULAR | Status: AC
  Administered 2021-11-22: 4000 [IU]
  Filled 2021-11-22: qty 4

## 2021-11-22 MED ORDER — ANTICOAGULANT SODIUM CITRATE 4% (200MG/5ML) IV SOLN: 5.0000 mL | Status: DC | PRN

## 2021-11-22 MED ORDER — ALTEPLASE 2 MG IJ SOLR: 2.0000 mg | Freq: Once | INTRAMUSCULAR | Status: DC | PRN

## 2021-11-22 MED ORDER — PENTAFLUOROPROP-TETRAFLUOROETH EX AERO: 1.0000 | INHALATION_SPRAY | CUTANEOUS | Status: DC | PRN

## 2021-11-22 MED ORDER — HEPARIN SODIUM (PORCINE) 1000 UNIT/ML DIALYSIS: 1000.0000 [IU] | INTRAMUSCULAR | Status: DC | PRN

## 2021-11-22 NOTE — Progress Notes (Signed)
Progress Note   Patient: [name] BCW:[id number] DOB: 12/26/1965 DOA: 08/26/2021     88 DOS: the patient was seen and examined on 11/22/2021   Brief hospital course: [name] is a 56 y.o. female with a history of ESRD, anemia of CKD, diabetes mellitus type 2, hyperlipidemia, CVA, anxiety, depression, GERD, tobacco, left hip fracture s/p IM nail placement and recent hardware removal. Patient presented secondary to left hip bleeding and found to have evidence of subacute osteomyelitis and abscess of left femur. Orthopedic surgery/infectious disease consulted. Patient was started on empiric antibiotics and underwent multiple I&Ds. Patient completed a 6 week course of daptomycin. Discharge is pending ability for patient to tolerate HD in a chair which is complicated by patient's refusal/inability to sit in a chair secondary to pain. Patient tolerated HD in a chair on 9/4.  Assessment and Plan: ESRD on HD MWF/anasarca -Per nephrology. -Has to do HD in chair for outpatient HD, which pt has apparently been refusing thus far Per Nephrology, now states she is willing to work with PT. -Continue fluid restriction   Subacute osteomyelitis of left femur with abscess/infected left hip hardware s/p removal on 6/10 prior to admission with I&D on 6/28 and 7/1 -Completed 6 weeks of appropriate antibiotics per ID recommendation.   -CT hip on 9/2 with fluid collection and soft tissue thickening.   -Ortho rec-outpatient f/up with Dr. [name] 1 wk after discharge.  Low suspicion for persistent infection. -Pain control   History of left intertrochanteric fracture: previously managed with IM nail placement in 2021. Hardware removed secondary to above.   Hyponatremia/Hypokalemia/Hyperkalemia/hypomagnesemia -Management with hemodialysis   ABLA superimposed on anemia of chronic disease: Stable. -Monitor intermittently   Controlled IDDM-2 with hyperglycemia and ESRD: A1c 5.9%. -Continue  SSI-sensitive -Continue NovoLog 4 units 3 times daily with meals -Increase Semglee from 5 to 7 units at night -Glucose trends stable    Primary hypertension -Continue amlodipine, hydralazine and Coreg   History of CVA/HLD -Continue Lipitor   Right knee pain: Likely osteoarthritis.  Patient reported worsening right knee pain to therapy and refused to participate -Xray of from 9/19 reviewed. Neg -Continue pain meds   GERD -Continue Pepcid and Protonix   Tobacco use -Tobacco cessation discussed during this admission.   Anxiety and depression: -Continue home meds -Seems stable      Subjective: Seen on HD. Without complaints  Physical Exam: Vitals:   11/22/21 1130 11/22/21 1200 11/22/21 1239 11/22/21 1322  BP: 135/62 119/64 (!) 121/52 (!) 135/48  Pulse: 62 62 65 64  Resp: (!) 7 (!) 9 10 18   Temp:   98.1 F (36.7 C) 98.1 F (36.7 C)  TempSrc:   Oral Oral  SpO2: 98% 98% 97% 97%  Weight:   90.1 kg   Height:       General exam: Awake, laying in bed, in nad Respiratory system: Normal respiratory effort, no wheezing Cardiovascular system: regular rate, s1, s2 Gastrointestinal system: Soft, nondistended, positive BS Central nervous system: CN2-12 grossly intact, strength intact Extremities: Perfused, no clubbing Skin: Normal skin turgor, no notable skin lesions seen Psychiatry: Mood normal // no visual hallucinations   Data Reviewed:  There are no new results to review at this time.  Family Communication:  Pt in room , family not at bedside  Disposition: Status is: Inpatient Remains inpatient appropriate because: Severity of illness  Planned Discharge Destination:  Unclear at this time     Author: Marylu Lund, MD 11/22/2021 3:59 PM  For  on call review [email or web address].

## 2021-11-22 NOTE — Plan of Care (Signed)
  Problem: Pain Managment: Goal: General experience of comfort will improve Outcome: Progressing   Problem: Nutritional: Goal: Ability to make healthy dietary choices will improve Outcome: Progressing   Problem: Activity: Goal: Risk for activity intolerance will decrease Outcome: Not Progressing

## 2021-11-22 NOTE — Progress Notes (Signed)
[facility] Progress Note   Subjective:   Seen on HD, was seen by palliative care yesterday and reported knee pain. Said she thinks she can sit for outpatient HD but worried about transfer to car, says she is going to work on this with PT. No concerns today, denies SOB, CP, dizziness and nausea.   Objective Vitals:   11/22/21 0542 11/22/21 0750 11/22/21 0800 11/22/21 0830  BP: (!) 154/71 (!) 147/55 (!) 151/124 (!) 138/58  Pulse: 64 (!) 59 60 63  Resp: 18 11 (!) 9 10  Temp: 98.4 F (36.9 C) 98 F (36.7 C)    TempSrc: Oral Oral    SpO2: 100% 96% 95% 96%  Weight:  94.2 kg    Height:       Physical Exam General: [name] Heart: RRR, no murmurs, rubs or gallops Lungs: CTA bilaterally without wheezing, rhonchi or rales Abdomen: Soft, non-distended, +BS Extremities: No edema b.l lower extremities Dialysis Access: LUE AVF accessed  Additional Objective Labs: Basic Metabolic Panel: Recent Labs  Lab 11/18/21 0920 11/20/21 0758  NA 126* 124*  K 4.9 4.7  CL 85* 84*  CO2 26 25  GLUCOSE 176* 198*  BUN 97* 80*  CREATININE 6.61* 6.20*  CALCIUM 9.0 8.8*  PHOS 6.6* 6.6*   Liver Function Tests: Recent Labs  Lab 11/18/21 0920 11/20/21 0758  ALBUMIN 3.2* 3.0*   No results for input(s): "LIPASE", "AMYLASE" in the last 168 hours. CBC: Recent Labs  Lab 11/18/21 0920 11/20/21 0757 11/22/21 0759  WBC 6.7 5.9 5.9  HGB 11.2* 11.2* 10.9*  HCT 33.2* 33.0* 33.8*  MCV 84.3 83.5 84.9  PLT 234 200 199   Blood Culture    Component Value Date/Time   SDES TISSUE 08/28/2021 0917   SPECREQUEST LEFT THIGH TISSUE 08/28/2021 0917   CULT  08/28/2021 0917    MODERATE ENTEROCOCCUS FAECIUM VANCOMYCIN RESISTANT ENTEROCOCCUS ISOLATED NO ANAEROBES ISOLATED Sent to [facility] for further susceptibility testing. Performed at [facility], [place] [address] [place], [place] [address]    REPTSTATUS 09/02/2021 FINAL 08/28/2021 3500    Cardiac Enzymes: No results  for input(s): "CKTOTAL", "CKMB", "CKMBINDEX", "TROPONINI" in the last 168 hours. CBG: Recent Labs  Lab 11/21/21 0727 11/21/21 1142 11/21/21 1645 11/21/21 2041 11/22/21 0718  GLUCAP 163* 219* 179* 136* 172*   Iron Studies: No results for input(s): "IRON", "TIBC", "TRANSFERRIN", "FERRITIN" in the last 72 hours. @lablastinr3 @ Studies/Results: No results found. Medications:  anticoagulant sodium citrate     methocarbamol (ROBAXIN) IV      (feeding supplement) PROSource Plus  30 mL Oral BID BM   alosetron  1 mg Oral BID   amitriptyline  25 mg Oral QHS   amLODipine  10 mg Oral Q24H   atorvastatin  40 mg Oral q1800   carvedilol  6.25 mg Oral BID   cyclobenzaprine  10 mg Oral TID   docusate sodium  200 mg Oral BID   DULoxetine  60 mg Oral Daily   famotidine  20 mg Oral Daily   furosemide  80 mg Oral Daily   heparin  5,000 Units Subcutaneous Q8H   hydrALAZINE  25 mg Oral TID   insulin aspart  0-9 Units Subcutaneous TID WC   insulin aspart  4 Units Subcutaneous TID WC   insulin glargine-yfgn  7 Units Subcutaneous QHS   multivitamin  1 tablet Oral QHS   pantoprazole  80 mg Oral Daily   polyethylene glycol  17 g Oral Daily  sevelamer carbonate  2,400 mg Oral TID WC   sodium zirconium cyclosilicate  10 g Oral Once per day on Sun Tue Thu Sat    Outpatient Dialysis Orders: MWF at [facility], 400/500, EDW 90kg, 2K/2Ca, LUE AVF -heparin 4000 unit IV bolus - No ESA  Assessment/Plan: Recurrent L hip osteomyelitis: S/p 08/10/21 surgery (removal hip IMN which was placed 12/2019) with persistent VRE infection. Back to OR 08/28/21, 08/31/21 for L hip debridement and wound vac placement (now removed). Finished 6 week course of IV Daptomycin on 10/12/21. Management per primary team.  Chronic L hip/stump pain: She does have fractures of L femoral neck (where hardware was removed) as well as distal femur Fx. Pain control per primary.  ESRD: Continue HD on usual MWF - see dispo  below Hyperkalemia: Recurrent issues, on 2K bath + furosemide. Lokelma 10g added on non-HD days. K+ improved overall AVF dysfunction: S/p branch ligation in OR 09/19/2021 per Dr. Virl Cagey. Was having intermittent cannulation issues, now working well. HTN/volume: BP controlled on amlodipine/hydralazine/Coreg. Edema  still present  - reminded on fluid restrictions (exceeds). Getting ~4.5L UFG q HD. Anemia of ESRD: Hgb at goal, not on ESA.  Secondary HPTH: PTH/Ca. Not on VDRA. Phos mildly elevated, continue binders Nutrition: Alb low, continue protein supplements. DM2: Insulin per primary Dispo: SNF previously recommended, now she would like to go home on discharge even though HD unit has been clear that they are prohibited by company policy to assist patient out of her car and into the facility. Patient is aware of this-says she is going to work with transfer with PT.  Asks about home HD but they would be able to train her since she is in the hospital. Dialyzed in recliner on 9/4 but signed off early due to pain, has refused since then due to various pain complaints.   Anice Paganini, PA-C 11/22/2021, 8:50 AM  [facility] Pager: [phone]

## 2021-11-22 NOTE — Progress Notes (Addendum)
Received patient in bed to unit.  Alert and oriented.  Informed consent signed and in chart.   Treatment initiated: 0800 Treatment completed: 1239  Patient tolerated well.  Transported back to the room  Alert, without acute distress.  Hand-off given to patient's nurse.   Access used: fistula left arm  Access issues: yes. Cannulation needles positional  Total UF removed: 4 liters Medication(s) given: heparin 4000 units Post HD VS: 121/52 HR 65 RR 10 Sat 97% on room air Temp oral 98.1 Post HD weight: 90.1 kg bed wt   [facility]

## 2021-11-22 NOTE — TOC Progression Note (Signed)
Transition of Care [facility]) - Initial/Assessment Note    Patient Details  Name: [name] MRN: [id number] Date of Birth: [birth year]  Transition of Care [facility]) CM/SW Contact:    Milinda Antis, [place] Phone Number: 11/22/2021, 4:07 PM  Clinical Narrative:                 Patient is continues to receive dialysis via stretcher which is a barrier to discharge.  TOC will continue to follow.  Expected Discharge Plan: Skilled Nursing Facility Barriers to Discharge: Continued Medical Work up   Patient Goals and CMS Choice Patient states their goals for this hospitalization and ongoing recovery are:: To return home CMS Medicare.gov Compare Post Acute Care list provided to:: Patient Choice offered to / list presented to : NA  Expected Discharge Plan and Services Expected Discharge Plan: [place]   Discharge Planning Services: CM Consult   Living arrangements for the past 2 months: Single Family Home                                      Prior Living Arrangements/Services Living arrangements for the past 2 months: Single Family Home Lives with:: Adult Children Patient language and need for interpreter reviewed:: Yes Do you feel safe going back to the place where you live?: Yes      Need for Family Participation in Patient Care: Yes (Comment) Care giver support system in place?: Yes (comment) Current home services: DME ([employer], w/c) Criminal Activity/Legal Involvement Pertinent to Current Situation/Hospitalization: No - Comment as needed  Activities of Daily Living Home Assistive Devices/Equipment: Eyeglasses, Wheelchair ADL Screening (condition at time of admission) Patient's cognitive ability adequate to safely complete daily activities?: Yes Is the patient deaf or have difficulty hearing?: No Does the patient have difficulty seeing, even when wearing glasses/contacts?: No Does the patient have difficulty concentrating, remembering, or making  decisions?: No Patient able to express need for assistance with ADLs?: Yes Does the patient have difficulty dressing or bathing?: Yes Independently performs ADLs?: No Communication: Independent Dressing (OT): Independent Is this a change from baseline?: Pre-admission baseline Grooming: Independent Feeding: Independent Bathing: Needs assistance Is this a change from baseline?: Pre-admission baseline Toileting: Needs assistance Is this a change from baseline?: Pre-admission baseline In/Out Bed: Needs assistance, Dependent Is this a change from baseline?: Pre-admission baseline Walks in Home: Dependent Is this a change from baseline?: Pre-admission baseline Does the patient have difficulty walking or climbing stairs?: Yes Weakness of Legs: Both Weakness of Arms/Hands: Both  Permission Sought/Granted Permission sought to share information with : Case Manager, Family Supports Permission granted to share information with : Yes, Verbal Permission Granted              Emotional Assessment Appearance:: Appears stated age Attitude/Demeanor/Rapport: Engaged, Gracious Affect (typically observed): Accepting, Appropriate, Calm, Hopeful Orientation: : Oriented to Self, Oriented to Place, Oriented to  Time, Oriented to Situation Alcohol / Substance Use: Not Applicable Psych Involvement: No (comment)  Admission diagnosis:  Wound infection [T14.8XXA, L08.9] Abscess of left hip [L02.416] Patient Active Problem List   Diagnosis Date Noted   Wound dehiscence, surgical, sequela    Abscess of left hip 08/26/2021   History of CVA (cerebrovascular accident) 93/57/0177   Hardware complicating wound infection ([place])    Subacute osteomyelitis of left femur with abscess     Septic arthritis of hip ([place]) 07/30/2021   Skin abscess L  hip 07/28/2021   HCAP (healthcare-associated pneumonia) 07/27/2021   Hyperkalemia 00/71/2197   Metabolic acidemia 58/83/2549   Closed fracture of left distal femur  ([place]) 07/22/2021   Type 2 diabetes mellitus with hyperlipidemia ([place]) 07/22/2021   Infection of superficial incisional surgical site after procedure 03/09/2020   Abscess    Hypoglycemia    Essential hypertension    Sleep disturbance    ESRD (end stage renal disease) on dialysis ([place]) 01/24/2020   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[place]) 01/23/2020   Hyponatremia    Constipation    Chronic osteomyelitis involving left ankle and foot ([place])    Ulcer of left foot with necrosis of bone ([place])    Wound infection 12/28/2019   History of [place] amputation of left foot ([place]) 12/25/2017   History of CVA (cerebrovascular accident) without residual deficits 07/04/2017   Cerebral thrombosis with cerebral infarction 04/08/2017   Right sided weakness 04/07/2017   Hyperhidrosis 09/01/2016   Migraine with aura and without status migrainosus, not intractable 07/28/2016   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[place]) 08/04/2014   CKD stage 3 due to type 2 diabetes mellitus ([place]) 06/27/2014   Vitamin D insufficiency 05/08/2014   Obesity (BMI 30.0-34.9) 05/08/2014   Unilateral amputation of left foot ([place]) 05/08/2014   Bursitis of left shoulder 02/14/2014   Neck pain 01/03/2014   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.23) 01/14/2013   Insomnia 08/04/2012   Diabetic neuropathy, painful ([place]) 08/04/2011   Anxiety and depression 05/16/2010   Female stress incontinence 11/01/2007   TOBACCO DEPENDENCE 04/30/2006   GASTROESOPHAGEAL REFLUX, NO ESOPHAGITIS 04/30/2006   Irritable bowel syndrome 04/30/2006   PCP:  [name], MD Pharmacy:   [facility], [place] [address] [place] [address] Phone: [phone] Fax: [phone]  [id number] - [place], [place] [address] [place] [address] Phone: [phone] Fax:  [phone]     Social Determinants of Health (SDOH) Interventions    Readmission Risk Interventions    07/26/2021    3:15 PM  Readmission Risk Prevention Plan  Transportation Screening Complete  PCP or Specialist Appt within 3-5 Days Complete  HRI or Home Care Consult Complete  Palliative Care Screening Not Applicable  Medication Review (RN Care Manager) Referral to Pharmacy

## 2021-11-23 DIAGNOSIS — Z992 Dependence on renal dialysis: Secondary | ICD-10-CM | POA: Diagnosis not present

## 2021-11-23 DIAGNOSIS — M86252 Subacute osteomyelitis, left femur: Secondary | ICD-10-CM | POA: Diagnosis not present

## 2021-11-23 DIAGNOSIS — N186 End stage renal disease: Secondary | ICD-10-CM | POA: Diagnosis not present

## 2021-11-23 DIAGNOSIS — I10 Essential (primary) hypertension: Secondary | ICD-10-CM | POA: Diagnosis not present

## 2021-11-23 LAB — GLUCOSE, CAPILLARY
Glucose-Capillary: 230 mg/dL — ABNORMAL HIGH (ref 70–99)
Glucose-Capillary: 243 mg/dL — ABNORMAL HIGH (ref 70–99)
Glucose-Capillary: 196 mg/dL — ABNORMAL HIGH (ref 70–99)
Glucose-Capillary: 100 mg/dL — ABNORMAL HIGH (ref 70–99)

## 2021-11-23 NOTE — Progress Notes (Signed)
Slept well last night. Pain medications as requested. No new changes to document. Had BM this morning. Safety maintained.

## 2021-11-23 NOTE — Progress Notes (Signed)
[facility] Progress Note   Subjective:   Seen in room, no new concerns. Discussed possibility of working with PT more frequently to increase her mobility but she declines, says she does not want therapy on HD days. Denies SOB, CP, and dizziness.   Objective Vitals:   11/22/21 1239 11/22/21 1322 11/22/21 2057 11/23/21 0548  BP: (!) 121/52 (!) 135/48 (!) 121/56 (!) 140/53  Pulse: 65 64 69 64  Resp: 10 18 16 16   Temp: 98.1 F (36.7 C) 98.1 F (36.7 C) 98.5 F (36.9 C) (!) 97.5 F (36.4 C)  TempSrc: Oral Oral    SpO2: 97% 97% 96% 100%  Weight: 90.1 kg     Height:       Physical Exam General: Alert female, eating breakfast, NAD Heart: RRR, no murmurs, rubs or gallops Lungs: CTA bilaterally without wheezing, rhonchi or rales Abdomen: Soft, non-distended, +BS Extremities: No edema b/l lower extremities Dialysis Access:  LUE AVF + bruit  Additional Objective Labs: Basic Metabolic Panel: Recent Labs  Lab 11/18/21 0920 11/20/21 0758 11/22/21 0800  NA 126* 124* 126*  K 4.9 4.7 4.8  CL 85* 84* 88*  CO2 26 25 28   GLUCOSE 176* 198* 182*  BUN 97* 80* 71*  CREATININE 6.61* 6.20* 6.24*  CALCIUM 9.0 8.8* 9.1  PHOS 6.6* 6.6* 6.3*   Liver Function Tests: Recent Labs  Lab 11/18/21 0920 11/20/21 0758 11/22/21 0800  ALBUMIN 3.2* 3.0* 3.2*   No results for input(s): "LIPASE", "AMYLASE" in the last 168 hours. CBC: Recent Labs  Lab 11/18/21 0920 11/20/21 0757 11/22/21 0759  WBC 6.7 5.9 5.9  HGB 11.2* 11.2* 10.9*  HCT 33.2* 33.0* 33.8*  MCV 84.3 83.5 84.9  PLT 234 200 199   Blood Culture    Component Value Date/Time   SDES TISSUE 08/28/2021 0917   SPECREQUEST LEFT THIGH TISSUE 08/28/2021 0917   CULT  08/28/2021 0917    MODERATE ENTEROCOCCUS FAECIUM VANCOMYCIN RESISTANT ENTEROCOCCUS ISOLATED NO ANAEROBES ISOLATED Sent to [place] for further susceptibility testing. Performed at [facility], [place] [address], [place],  [address]     REPTSTATUS 09/02/2021 FINAL 08/28/2021 6160    Cardiac Enzymes: No results for input(s): "CKTOTAL", "CKMB", "CKMBINDEX", "TROPONINI" in the last 168 hours. CBG: Recent Labs  Lab 11/22/21 0718 11/22/21 1320 11/22/21 1646 11/22/21 2113 11/23/21 0732  GLUCAP 172* 127* 175* 227* 230*   Iron Studies: No results for input(s): "IRON", "TIBC", "TRANSFERRIN", "FERRITIN" in the last 72 hours. @lablastinr3 @ Studies/Results: No results found. Medications:  methocarbamol (ROBAXIN) IV      (feeding supplement) PROSource Plus  30 mL Oral BID BM   alosetron  1 mg Oral BID   amitriptyline  25 mg Oral QHS   amLODipine  10 mg Oral Q24H   atorvastatin  40 mg Oral q1800   carvedilol  6.25 mg Oral BID   cyclobenzaprine  10 mg Oral TID   docusate sodium  200 mg Oral BID   DULoxetine  60 mg Oral Daily   famotidine  20 mg Oral Daily   furosemide  80 mg Oral Daily   heparin  5,000 Units Subcutaneous Q8H   hydrALAZINE  25 mg Oral TID   insulin aspart  0-9 Units Subcutaneous TID WC   insulin aspart  4 Units Subcutaneous TID WC   insulin glargine-yfgn  7 Units Subcutaneous QHS   multivitamin  1 tablet Oral QHS   pantoprazole  80 mg Oral Daily   polyethylene glycol  17  g Oral Daily   sevelamer carbonate  2,400 mg Oral TID WC   sodium zirconium cyclosilicate  10 g Oral Once per day on Sun Tue Thu Sat   Outpatient Dialysis Orders: MWF at [facility], 400/500, EDW 90kg, 2K/2Ca, LUE AVF -heparin 4000 unit IV bolus - No ESA  Assessment/Plan: Recurrent L hip osteomyelitis: S/p 08/10/21 surgery (removal hip IMN which was placed 12/2019) with persistent VRE infection. Back to OR 08/28/21, 08/31/21 for L hip debridement and wound vac placement (now removed). Finished 6 week course of IV Daptomycin on 10/12/21. Management per primary team.  Chronic L hip/stump pain: She does have fractures of L femoral neck (where hardware was removed) as well as distal femur Fx. Pain control per primary.  ESRD:  Continue HD on usual MWF - see dispo below Hyperkalemia: Recurrent issues, on 2K bath + furosemide. Lokelma 10g added on non-HD days. K+ improved overall AVF dysfunction: S/p branch ligation in OR 09/19/2021 per Dr. Virl Cagey. Was having intermittent cannulation issues, now working well. HTN/volume: BP controlled on amlodipine/hydralazine/Coreg. Edema  still present  - reminded on fluid restrictions (exceeds). Getting ~4.5L UFG q HD. Anemia of ESRD: Hgb at goal, not on ESA.  Secondary HPTH: PTH/Ca. Not on VDRA. Phos mildly elevated, continue binders Nutrition: Alb low, continue protein supplements. DM2: Insulin per primary Dispo: SNF previously recommended, now she would like to go home on discharge even though HD unit has been clear that they are prohibited by company policy to assist patient out of her car and into the facility. Patient is aware of this-says she is going to work with transfer with PT.  Asks about home HD but they would be able to train her since she is in the hospital. Dialyzed in recliner on 9/4 but signed off early due to pain, has refused since then due to various pain complaints.     Anice Paganini, PA-C 11/23/2021, 9:01 AM  [facility] Pager: [phone]

## 2021-11-23 NOTE — Progress Notes (Signed)
Progress Note   Patient: [name] [id number]:[id number] DOB: May 09, 1965 DOA: 08/26/2021     89 DOS: the patient was seen and examined on 11/23/2021   Brief hospital course: KERLINE TRAHAN is a 56 y.o. female with a history of ESRD, anemia of CKD, diabetes mellitus type 2, hyperlipidemia, CVA, anxiety, depression, GERD, tobacco, left hip fracture s/p IM nail placement and recent hardware removal. Patient presented secondary to left hip bleeding and found to have evidence of subacute osteomyelitis and abscess of left femur. Orthopedic surgery/infectious disease consulted. Patient was started on empiric antibiotics and underwent multiple I&Ds. Patient completed a 6 week course of daptomycin. Discharge is pending ability for patient to tolerate HD in a chair which is complicated by patient's refusal/inability to sit in a chair secondary to pain. Patient tolerated HD in a chair on 9/4.  Assessment and Plan: ESRD on HD MWF/anasarca -Per nephrology. -Has to do HD in chair for outpatient HD, which pt has apparently been refusing thus far Per Nephrology, now states she is willing to work with PT. TOC following -Continue fluid restriction   Subacute osteomyelitis of left femur with abscess/infected left hip hardware s/p removal on 6/10 prior to admission with I&D on 6/28 and 7/1 -Completed 6 weeks of appropriate antibiotics per ID recommendation.   -CT hip on 9/2 with fluid collection and soft tissue thickening.   -Ortho rec-outpatient f/up with Dr. [name] 1 wk after discharge.  Low suspicion for persistent infection. -Pain control   History of left intertrochanteric fracture: previously managed with IM nail placement in 2021. Hardware removed secondary to above.   Hyponatremia/Hypokalemia/Hyperkalemia/hypomagnesemia -Management with hemodialysis   ABLA superimposed on anemia of chronic disease: Stable. -Monitor intermittently   Controlled IDDM-2 with hyperglycemia and ESRD: A1c 5.9%. -Continue  SSI-sensitive -Continue NovoLog 4 units 3 times daily with meals -Increase Semglee from 5 to 7 units at night -Glucose trends stable    Primary hypertension -Continue amlodipine, hydralazine and Coreg   History of CVA/HLD -Continue Lipitor   Right knee pain: Likely osteoarthritis.  Patient reported worsening right knee pain to therapy and refused to participate -Xray of from 9/19 reviewed. Neg -Continue pain meds   GERD -Continue Pepcid and Protonix   Tobacco use -Tobacco cessation discussed during this admission.   Anxiety and depression: -Continue home meds -Seems stable      Subjective: States LE swelling   Physical Exam: Vitals:   11/22/21 1239 11/22/21 1322 11/22/21 2057 11/23/21 0548  BP: (!) 121/52 (!) 135/48 (!) 121/56 (!) 140/53  Pulse: 65 64 69 64  Resp: 10 18 16 16   Temp: 98.1 F (36.7 C) 98.1 F (36.7 C) 98.5 F (36.9 C) (!) 97.5 F (36.4 C)  TempSrc: Oral Oral    SpO2: 97% 97% 96% 100%  Weight: 90.1 kg     Height:       General exam: Conversant, in no acute distress Respiratory system: normal chest rise, clear, no audible wheezing Cardiovascular system: regular rhythm, s1-s2 Gastrointestinal system: Nondistended, nontender, pos BS Central nervous system: No seizures, no tremors Extremities: No cyanosis, no joint deformities Skin: No rashes, no pallor Psychiatry: Affect normal // no auditory hallucinations   Data Reviewed:  There are no new results to review at this time.  Family Communication:  Pt in room , family not at bedside  Disposition: Status is: Inpatient Remains inpatient appropriate because: Severity of illness  Planned Discharge Destination:  Unclear at this time     Author: Marylu Lund, MD  11/23/2021 2:19 PM  For on call review [email or web address].

## 2021-11-24 DIAGNOSIS — M86252 Subacute osteomyelitis, left femur: Secondary | ICD-10-CM | POA: Diagnosis not present

## 2021-11-24 DIAGNOSIS — K2100 Gastro-esophageal reflux disease with esophagitis, without bleeding: Secondary | ICD-10-CM | POA: Diagnosis not present

## 2021-11-24 DIAGNOSIS — I10 Essential (primary) hypertension: Secondary | ICD-10-CM | POA: Diagnosis not present

## 2021-11-24 LAB — GLUCOSE, CAPILLARY
Glucose-Capillary: 153 mg/dL — ABNORMAL HIGH (ref 70–99)
Glucose-Capillary: 141 mg/dL — ABNORMAL HIGH (ref 70–99)
Glucose-Capillary: 138 mg/dL — ABNORMAL HIGH (ref 70–99)
Glucose-Capillary: 166 mg/dL — ABNORMAL HIGH (ref 70–99)

## 2021-11-24 MED ORDER — CHLORHEXIDINE GLUCONATE CLOTH 2 % EX PADS: 6.0000 | MEDICATED_PAD | Freq: Every day | CUTANEOUS | Status: DC

## 2021-11-24 MED ORDER — CAMPHOR-MENTHOL 0.5-0.5 % EX LOTN: TOPICAL_LOTION | CUTANEOUS | Status: DC | PRN

## 2021-11-24 NOTE — Progress Notes (Signed)
Progress Note   Patient: [name] JQB:[id number] DOB: 1965-03-21 DOA: 08/26/2021     90 DOS: the patient was seen and examined on 11/24/2021   Brief hospital course: [name] is a 56 y.o. female with a history of ESRD, anemia of CKD, diabetes mellitus type 2, hyperlipidemia, CVA, anxiety, depression, GERD, tobacco, left hip fracture s/p IM nail placement and recent hardware removal. Patient presented secondary to left hip bleeding and found to have evidence of subacute osteomyelitis and abscess of left femur. Orthopedic surgery/infectious disease consulted. Patient was started on empiric antibiotics and underwent multiple I&Ds. Patient completed a 6 week course of daptomycin. Discharge is pending ability for patient to tolerate HD in a chair which is complicated by patient's refusal/inability to sit in a chair secondary to pain. Patient tolerated HD in a chair on 9/4.  Assessment and Plan: ESRD on HD MWF/anasarca -Per nephrology. -Has to do HD in chair for outpatient HD, which pt has apparently been refusing thus far Per Nephrology, now states she is willing to work with PT. TOC is following for disposition -Continue fluid restriction   Subacute osteomyelitis of left femur with abscess/infected left hip hardware s/p removal on 6/10 prior to admission with I&D on 6/28 and 7/1 -Completed 6 weeks of appropriate antibiotics per ID recommendation.   -CT hip on 9/2 with fluid collection and soft tissue thickening.   -Ortho rec-outpatient f/up with Dr. [name] 1 wk after discharge.  Low suspicion for persistent infection. -Pain control   History of left intertrochanteric fracture: previously managed with IM nail placement in 2021. Hardware removed secondary to above.   Hyponatremia/Hypokalemia/Hyperkalemia/hypomagnesemia -Management with hemodialysis   ABLA superimposed on anemia of chronic disease: Stable. -Monitor intermittently   Controlled IDDM-2 with hyperglycemia and ESRD: A1c  5.9%. -Continue SSI-sensitive -Continue NovoLog 4 units 3 times daily with meals -Increase Semglee from 5 to 7 units at night -Glucose trends stable    Primary hypertension -Continue amlodipine, hydralazine and Coreg   History of CVA/HLD -Continue Lipitor   Right knee pain: Likely osteoarthritis.  Patient reported worsening right knee pain to therapy and refused to participate -Xray of from 9/19 reviewed. Neg -Continue pain meds   GERD -Continue Pepcid and Protonix   Tobacco use -Tobacco cessation discussed during this admission.   Anxiety and depression: -Continue home meds -Seems stable      Subjective: Complains of itching of LLE and arm  Physical Exam: Vitals:   11/23/21 1653 11/23/21 2112 11/24/21 0649 11/24/21 0925  BP: (!) 138/57 118/63 (!) 152/55 (!) 137/54  Pulse: 62 (!) 59 63 61  Resp: 18 18 18 18   Temp: 98.1 F (36.7 C) 98.2 F (36.8 C) 98.4 F (36.9 C) 98.5 F (36.9 C)  TempSrc: Oral Oral Oral Oral  SpO2: 98% 97% 100% 98%  Weight:      Height:       General exam: Awake, laying in bed, in nad Respiratory system: Normal respiratory effort, no wheezing Cardiovascular system: regular rate, s1, s2 Gastrointestinal system: Soft, nondistended, positive BS Central nervous system: CN2-12 grossly intact, strength intact Extremities: s/p LLE amputation, no clubbing Skin: Normal skin turgor, no notable skin lesions seen Psychiatry: Mood normal // no visual hallucinations   Data Reviewed:  There are no new results to review at this time.  Family Communication:  Pt in room , family not at bedside  Disposition: Status is: Inpatient Remains inpatient appropriate because: Severity of illness  Planned Discharge Destination:  Unclear at this  time     Author: Marylu Lund, MD 11/24/2021 3:20 PM  For on call review [email or web address].

## 2021-11-24 NOTE — Plan of Care (Signed)
  Problem: Activity: Goal: Risk for activity intolerance will decrease Outcome: Progressing   Problem: Pain Managment: Goal: General experience of comfort will improve Outcome: Progressing   Problem: Fluid Volume: Goal: Compliance with measures to maintain balanced fluid volume will improve Outcome: Progressing   Problem: Nutritional: Goal: Ability to make healthy dietary choices will improve Outcome: Progressing

## 2021-11-24 NOTE — Progress Notes (Addendum)
[facility] Progress Note   Subjective:   No new concerns today. Denies SOB, CP, dizziness and nausea. Reports edema is stable.   Objective Vitals:   11/23/21 0548 11/23/21 1653 11/23/21 2112 11/24/21 0649  BP: (!) 140/53 (!) 138/57 118/63 (!) 152/55  Pulse: 64 62 (!) 59 63  Resp: 16 18 18 18   Temp: (!) 97.5 F (36.4 C) 98.1 F (36.7 C) 98.2 F (36.8 C) 98.4 F (36.9 C)  TempSrc:  Oral Oral Oral  SpO2: 100% 98% 97% 100%  Weight:      Height:       Physical Exam General: Alert female, NAD Heart: RRR, no murmurs, rubs or gallops Lungs: CTA bilaterally without wheezing, rhonchi or rales Abdomen: Soft, non-distended, +BS Extremities: No edema b/l lower extremities Dialysis Access:  LUE AVF + bruit    Additional Objective Labs: Basic Metabolic Panel: Recent Labs  Lab 11/18/21 0920 11/20/21 0758 11/22/21 0800  NA 126* 124* 126*  K 4.9 4.7 4.8  CL 85* 84* 88*  CO2 26 25 28   GLUCOSE 176* 198* 182*  BUN 97* 80* 71*  CREATININE 6.61* 6.20* 6.24*  CALCIUM 9.0 8.8* 9.1  PHOS 6.6* 6.6* 6.3*   Liver Function Tests: Recent Labs  Lab 11/18/21 0920 11/20/21 0758 11/22/21 0800  ALBUMIN 3.2* 3.0* 3.2*   No results for input(s): "LIPASE", "AMYLASE" in the last 168 hours. CBC: Recent Labs  Lab 11/18/21 0920 11/20/21 0757 11/22/21 0759  WBC 6.7 5.9 5.9  HGB 11.2* 11.2* 10.9*  HCT 33.2* 33.0* 33.8*  MCV 84.3 83.5 84.9  PLT 234 200 199   Blood Culture    Component Value Date/Time   SDES TISSUE 08/28/2021 0917   SPECREQUEST LEFT THIGH TISSUE 08/28/2021 0917   CULT  08/28/2021 0917    MODERATE ENTEROCOCCUS FAECIUM VANCOMYCIN RESISTANT ENTEROCOCCUS ISOLATED NO ANAEROBES ISOLATED Sent to [facility] for further susceptibility testing. Performed at [facility], [place] [address], [place], [place] [address]    REPTSTATUS 09/02/2021 FINAL 08/28/2021 2683    Cardiac Enzymes: No results for input(s): "CKTOTAL", "CKMB", "CKMBINDEX", "TROPONINI" in  the last 168 hours. CBG: Recent Labs  Lab 11/23/21 0732 11/23/21 1131 11/23/21 1655 11/23/21 2112 11/24/21 0723  GLUCAP 230* 243* 196* 100* 153*   Iron Studies: No results for input(s): "IRON", "TIBC", "TRANSFERRIN", "FERRITIN" in the last 72 hours. @lablastinr3 @ Studies/Results: No results found. Medications:  methocarbamol (ROBAXIN) IV      (feeding supplement) PROSource Plus  30 mL Oral BID BM   alosetron  1 mg Oral BID   amitriptyline  25 mg Oral QHS   amLODipine  10 mg Oral Q24H   atorvastatin  40 mg Oral q1800   carvedilol  6.25 mg Oral BID   cyclobenzaprine  10 mg Oral TID   docusate sodium  200 mg Oral BID   DULoxetine  60 mg Oral Daily   famotidine  20 mg Oral Daily   furosemide  80 mg Oral Daily   heparin  5,000 Units Subcutaneous Q8H   hydrALAZINE  25 mg Oral TID   insulin aspart  0-9 Units Subcutaneous TID WC   insulin aspart  4 Units Subcutaneous TID WC   insulin glargine-yfgn  7 Units Subcutaneous QHS   multivitamin  1 tablet Oral QHS   pantoprazole  80 mg Oral Daily   polyethylene glycol  17 g Oral Daily   sevelamer carbonate  2,400 mg Oral TID WC   sodium zirconium cyclosilicate  10 g Oral  Once per day on Sun Tue Thu Sat    Outpatient Dialysis Orders: MWF at [facility], 400/500, EDW 90kg, 2K/2Ca, LUE AVF -heparin 4000 unit IV bolus - No ESA  Assessment/Plan: Recurrent L hip osteomyelitis: S/p 08/10/21 surgery (removal hip IMN which was placed 12/2019) with persistent VRE infection. Back to OR 08/28/21, 08/31/21 for L hip debridement and wound vac placement (now removed). Finished 6 week course of IV Daptomycin on 10/12/21. Management per primary team.  Chronic L hip/stump pain: She does have fractures of L femoral neck (where hardware was removed) as well as distal femur Fx. Pain control per primary.  ESRD: Continue HD on usual MWF - see dispo below Hyperkalemia: Recurrent issues, on 2K bath + furosemide. Lokelma 10g added on non-HD days. K+  improved overall AVF dysfunction: S/p branch ligation in OR 09/19/2021 per Dr. Virl Cagey. Was having intermittent cannulation issues, now working well. HTN/volume: BP controlled on amlodipine/hydralazine/Coreg. Edema  still present  - reminded on fluid restrictions (exceeds). Getting ~4.5L UFG q HD. Anemia of ESRD: Hgb at goal, not on ESA.  Secondary HPTH: PTH/Ca. Not on VDRA. Phos mildly elevated, continue binders Nutrition: Alb low, continue protein supplements. DM2: Insulin per primary Dispo: SNF previously recommended, now she would like to go home on discharge even though HD unit has been clear that they are prohibited by company policy to assist patient out of her car and into the facility. Patient is aware of this-says she is going to work with transfer with PT.  Asks about home HD but they would NOT be able to train her since she is in the hospital. Dialyzed in recliner on 9/4 but signed off early due to pain, has refused since then due to various pain complaints.   Anice Paganini, PA-C 11/24/2021, 9:26 AM  [facility] Pager: [phone]  Pt seen, examined and agree w A/P as above.  [name]  MD 11/24/2021, 5:42 PM

## 2021-11-25 DIAGNOSIS — I10 Essential (primary) hypertension: Secondary | ICD-10-CM | POA: Diagnosis not present

## 2021-11-25 DIAGNOSIS — M86252 Subacute osteomyelitis, left femur: Secondary | ICD-10-CM | POA: Diagnosis not present

## 2021-11-25 LAB — RENAL FUNCTION PANEL
Albumin: 3.1 g/dL — ABNORMAL LOW (ref 3.5–5.0)
Anion gap: 15 (ref 5–15)
BUN: 97 mg/dL — ABNORMAL HIGH (ref 6–20)
CO2: 25 mmol/L (ref 22–32)
Calcium: 9.2 mg/dL (ref 8.9–10.3)
Chloride: 89 mmol/L — ABNORMAL LOW (ref 98–111)
Creatinine, Ser: 6.65 mg/dL — ABNORMAL HIGH (ref 0.44–1.00)
GFR, Estimated: 7 mL/min — ABNORMAL LOW (ref >60)
Glucose, Bld: 176 mg/dL — ABNORMAL HIGH (ref 70–99)
Phosphorus: 7 mg/dL — ABNORMAL HIGH (ref 2.5–4.6)
Potassium: 5.5 mmol/L — ABNORMAL HIGH (ref 3.5–5.1)
Sodium: 129 mmol/L — ABNORMAL LOW (ref 135–145)

## 2021-11-25 LAB — CBC
HCT: 31.6 % — ABNORMAL LOW (ref 36.0–46.0)
HCT: 35.8 % — ABNORMAL LOW (ref 36.0–46.0)
Hemoglobin: 10.4 g/dL — ABNORMAL LOW (ref 12.0–15.0)
Hemoglobin: 12.2 g/dL (ref 12.0–15.0)
MCH: 27.3 pg (ref 26.0–34.0)
MCH: 28.2 pg (ref 26.0–34.0)
MCHC: 32.9 g/dL (ref 30.0–36.0)
MCHC: 34.1 g/dL (ref 30.0–36.0)
MCV: 82.9 fL (ref 80.0–100.0)
MCV: 82.7 fL (ref 80.0–100.0)
Platelets: 193 10*3/uL (ref 150–400)
Platelets: 198 10*3/uL (ref 150–400)
RBC: 3.81 MIL/uL — ABNORMAL LOW (ref 3.87–5.11)
RBC: 4.33 MIL/uL (ref 3.87–5.11)
RDW: 15.5 % (ref 11.5–15.5)
RDW: 15.2 % (ref 11.5–15.5)
WBC: 6.7 10*3/uL (ref 4.0–10.5)
WBC: 6.3 10*3/uL (ref 4.0–10.5)
nRBC: 0 % (ref 0.0–0.2)
nRBC: 0 % (ref 0.0–0.2)

## 2021-11-25 LAB — GLUCOSE, CAPILLARY
Glucose-Capillary: 186 mg/dL — ABNORMAL HIGH (ref 70–99)
Glucose-Capillary: 226 mg/dL — ABNORMAL HIGH (ref 70–99)
Glucose-Capillary: 158 mg/dL — ABNORMAL HIGH (ref 70–99)

## 2021-11-25 LAB — COMPREHENSIVE METABOLIC PANEL
ALT: 19 U/L (ref 0–44)
AST: 20 U/L (ref 15–41)
Albumin: 3.4 g/dL — ABNORMAL LOW (ref 3.5–5.0)
Alkaline Phosphatase: 110 U/L (ref 38–126)
Anion gap: 9 (ref 5–15)
BUN: 38 mg/dL — ABNORMAL HIGH (ref 6–20)
CO2: 28 mmol/L (ref 22–32)
Calcium: 9.2 mg/dL (ref 8.9–10.3)
Chloride: 94 mmol/L — ABNORMAL LOW (ref 98–111)
Creatinine, Ser: 3.64 mg/dL — ABNORMAL HIGH (ref 0.44–1.00)
GFR, Estimated: 14 mL/min — ABNORMAL LOW (ref >60)
Glucose, Bld: 195 mg/dL — ABNORMAL HIGH (ref 70–99)
Potassium: 4.2 mmol/L (ref 3.5–5.1)
Sodium: 131 mmol/L — ABNORMAL LOW (ref 135–145)
Total Bilirubin: 0.6 mg/dL (ref 0.3–1.2)
Total Protein: 8.5 g/dL — ABNORMAL HIGH (ref 6.5–8.1)

## 2021-11-25 MED ORDER — HEPARIN SODIUM (PORCINE) 1000 UNIT/ML IJ SOLN
INTRAMUSCULAR | Status: AC
  Filled 2021-11-25: qty 4

## 2021-11-25 MED ORDER — HEPARIN SODIUM (PORCINE) 1000 UNIT/ML DIALYSIS: 4000.0000 [IU] | Freq: Once | INTRAMUSCULAR | Status: DC

## 2021-11-25 NOTE — Procedures (Signed)
Patient seen and examined on Hemodialysis. BP (!) 139/55 (BP Location: Right Arm)   Pulse (!) 59   Temp 98.2 F (36.8 C) (Oral)   Resp (!) 9   Ht 5\' 10"  (1.778 m)   Wt 95.7 kg   SpO2 97%   BMI 30.27 kg/m   QB 400 mL/ min via L AVF, UF goal 4L  Tolerating treatment without complaints at this time.   Madelon Lips MD [facility] Pgr [phone] 9:44 AM

## 2021-11-25 NOTE — Progress Notes (Signed)
[facility] Progress Note   Assessment/ Plan:   Outpatient Dialysis Orders: MWF at [facility], 400/500, EDW 90kg, 2K/2Ca, LUE AVF -heparin 4000 unit IV bolus - No ESA   Assessment/Plan: Recurrent L hip osteomyelitis: S/p 08/10/21 surgery (removal hip IMN which was placed 12/2019) with persistent VRE infection. Back to OR 08/28/21, 08/31/21 for L hip debridement and wound vac placement (now removed). Finished 6 week course of IV Daptomycin on 10/12/21. Management per primary team.  Chronic L hip/stump pain: She does have fractures of L femoral neck (where hardware was removed) as well as distal femur Fx. Pain control per primary.  ESRD: Continue HD on usual MWF - see dispo below Hyperkalemia: Recurrent issues, on 2K bath + furosemide. Lokelma 10g added on non-HD days. K+ improved overall AVF dysfunction: S/p branch ligation in OR 09/19/2021 per Dr. Virl Cagey. Was having intermittent cannulation issues, now working well. HTN/volume: BP controlled on amlodipine/hydralazine/Coreg. Edema  still present  - reminded on fluid restrictions (exceeds). Getting ~4.5L UFG q HD. Anemia of ESRD: Hgb at goal, not on ESA.  Secondary HPTH: PTH/Ca. Not on VDRA. Phos mildly elevated, continue binders Nutrition: Alb low, continue protein supplements. DM2: Insulin per primary Dispo: SNF previously recommended, now she would like to go home on discharge even though HD unit has been clear that they are prohibited by company policy to assist patient out of her car and into the facility. Patient is aware of this-says she is going to work with transfer with PT.  Asks about home HD but they would NOT be able to train her since she is in the hospital. Dialyzed in recliner on 9/4 but signed off early due to pain, has declined to come in a recliner since then.   Madelon Lips MD [facility] Pager: [phone]  Subjective:    Seen and examined on HD.  No specific complaints today.      Objective:   BP (!) 139/55 (BP Location: Right Arm)   Pulse (!) 59   Temp 98.2 F (36.8 C) (Oral)   Resp (!) 9   Ht 5\' 10"  (1.778 m)   Wt 95.7 kg   SpO2 97%   BMI 30.27 kg/m   Physical Exam: Gen:NAD, sitting in bed CVS: RRR  PULM: clear Abd: soft Ext: RLE 2+ LE edema, LLE bent at knee, externally rotated.  2+ edema ACCESS: L AVF + T/B  Labs: BMET Recent Labs  Lab 11/20/21 0758 11/22/21 0800 11/25/21 0749  NA 124* 126* 129*  K 4.7 4.8 5.5*  CL 84* 88* 89*  CO2 25 28 25   GLUCOSE 198* 182* 176*  BUN 80* 71* 97*  CREATININE 6.20* 6.24* 6.65*  CALCIUM 8.8* 9.1 9.2  PHOS 6.6* 6.3* 7.0*   CBC Recent Labs  Lab 11/20/21 0757 11/22/21 0759 11/25/21 0749  WBC 5.9 5.9 6.7  HGB 11.2* 10.9* 10.4*  HCT 33.0* 33.8* 31.6*  MCV 83.5 84.9 82.9  PLT 200 199 193      Medications:     (feeding supplement) PROSource Plus  30 mL Oral BID BM   alosetron  1 mg Oral BID   amitriptyline  25 mg Oral QHS   amLODipine  10 mg Oral Q24H   atorvastatin  40 mg Oral q1800   carvedilol  6.25 mg Oral BID   cyclobenzaprine  10 mg Oral TID   docusate sodium  200 mg Oral BID   DULoxetine  60 mg Oral Daily   famotidine  20 mg Oral Daily   furosemide  80 mg Oral Daily   heparin  4,000 Units Dialysis Once in dialysis   heparin  5,000 Units Subcutaneous Q8H   heparin sodium (porcine)       hydrALAZINE  25 mg Oral TID   insulin aspart  0-9 Units Subcutaneous TID WC   insulin aspart  4 Units Subcutaneous TID WC   insulin glargine-yfgn  7 Units Subcutaneous QHS   multivitamin  1 tablet Oral QHS   pantoprazole  80 mg Oral Daily   polyethylene glycol  17 g Oral Daily   sevelamer carbonate  2,400 mg Oral TID WC   sodium zirconium cyclosilicate  10 g Oral Once per day on Sun Tue Thu Sat     Madelon Lips MD 11/25/2021, 9:40 AM

## 2021-11-25 NOTE — Progress Notes (Signed)
Progress Note   Patient: [name] BZJ:[id number] DOB: 09-15-1965 DOA: 08/26/2021     91 DOS: the patient was seen and examined on 11/25/2021   Brief hospital course: JESENIA SPERA is a 56 y.o. female with a history of ESRD, anemia of CKD, diabetes mellitus type 2, hyperlipidemia, CVA, anxiety, depression, GERD, tobacco, left hip fracture s/p IM nail placement and recent hardware removal. Patient presented secondary to left hip bleeding and found to have evidence of subacute osteomyelitis and abscess of left femur. Orthopedic surgery/infectious disease consulted. Patient was started on empiric antibiotics and underwent multiple I&Ds. Patient completed a 6 week course of daptomycin. Discharge is pending ability for patient to tolerate HD in a chair which is complicated by patient's refusal/inability to sit in a chair secondary to pain. Patient tolerated HD in a chair on 9/4.  Assessment and Plan: ESRD on HD MWF/anasarca -Per nephrology. -Has to do HD in chair for outpatient HD, which pt has apparently been refusing thus far Per Nephrology, now states she is willing to work with PT. TOC is following for disposition -Cont to encourage fluid restriction   Subacute osteomyelitis of left femur with abscess/infected left hip hardware s/p removal on 6/10 prior to admission with I&D on 6/28 and 7/1 -Completed 6 weeks of appropriate antibiotics per ID recommendation.   -CT hip on 9/2 with fluid collection and soft tissue thickening.   -Ortho rec-outpatient f/up with Dr. [name] 1 wk after discharge.  Low suspicion for persistent infection. -Pain control   History of left intertrochanteric fracture: previously managed with IM nail placement in 2021. Hardware removed secondary to above.   Hyponatremia/Hypokalemia/Hyperkalemia/hypomagnesemia -Management with hemodialysis   ABLA superimposed on anemia of chronic disease: Stable. -Monitor intermittently   Controlled IDDM-2 with hyperglycemia and  ESRD: A1c 5.9%. -Continue SSI-sensitive -Continue NovoLog 4 units 3 times daily with meals -Increase Semglee from 5 to 7 units at night -Glucose trends stable    Primary hypertension -Continue amlodipine, hydralazine and Coreg   History of CVA/HLD -Continue Lipitor   Right knee pain: Likely osteoarthritis.  Patient reported worsening right knee pain to therapy and refused to participate -Xray of from 9/19 reviewed. Neg -Continue pain meds   GERD -Continue Pepcid and Protonix   Tobacco use -Tobacco cessation discussed during this admission.   Anxiety and depression: -Continue home meds -Seems stable      Subjective: Complains of itching of LLE and arm  Physical Exam: Vitals:   11/25/21 1030 11/25/21 1100 11/25/21 1130 11/25/21 1214  BP: 127/62 (!) 129/58 (!) 129/55 (!) 123/58  Pulse: 62 62 63 70  Resp: (!) 9 10 12 20   Temp:    98.1 F (36.7 C)  TempSrc:      SpO2: 99% 100% 100% 100%  Weight:    91.5 kg  Height:       General exam: Awake, laying in bed, in nad Respiratory system: Normal respiratory effort, no wheezing Cardiovascular system: regular rate, s1, s2 Gastrointestinal system: Soft, nondistended, positive BS Central nervous system: CN2-12 grossly intact, strength intact Extremities: s/p LLE amputation, no clubbing Skin: Normal skin turgor, no notable skin lesions seen Psychiatry: Mood normal // no visual hallucinations   Data Reviewed:  There are no new results to review at this time.  Family Communication:  Pt in room , family not at bedside  Disposition: Status is: Inpatient Remains inpatient appropriate because: Severity of illness  Planned Discharge Destination:  Unclear at this time     Author:  Marylu Lund, MD 11/25/2021 1:05 PM  For on call review [email or web address].

## 2021-11-25 NOTE — Progress Notes (Signed)
Received patient in bed to unit.  Alert and oriented.  Informed consent signed and in chart.   Treatment initiated: [place] Treatment completed: 1204  Patient tolerated well.  Transported back to the room  Alert, without acute distress.  Hand-off given to patient's nurse.   Access used: Avfistula Access issues: none  Total UF removed: 4.0L Medication(s) given: none Post HD VS: 98.1,123/58,70,16,100% Post HD weight: 91.5kg   [facility]

## 2021-11-25 NOTE — Plan of Care (Signed)
  Problem: Activity: Goal: Risk for activity intolerance will decrease Outcome: Progressing   Problem: Pain Managment: Goal: General experience of comfort will improve Outcome: Progressing   Problem: Fluid Volume: Goal: Compliance with measures to maintain balanced fluid volume will improve Outcome: Progressing   

## 2021-11-26 DIAGNOSIS — M86252 Subacute osteomyelitis, left femur: Secondary | ICD-10-CM | POA: Diagnosis not present

## 2021-11-26 DIAGNOSIS — N186 End stage renal disease: Secondary | ICD-10-CM | POA: Diagnosis not present

## 2021-11-26 DIAGNOSIS — F419 Anxiety disorder, unspecified: Secondary | ICD-10-CM | POA: Diagnosis not present

## 2021-11-26 DIAGNOSIS — I10 Essential (primary) hypertension: Secondary | ICD-10-CM | POA: Diagnosis not present

## 2021-11-26 LAB — GLUCOSE, CAPILLARY
Glucose-Capillary: 147 mg/dL — ABNORMAL HIGH (ref 70–99)
Glucose-Capillary: 135 mg/dL — ABNORMAL HIGH (ref 70–99)
Glucose-Capillary: 144 mg/dL — ABNORMAL HIGH (ref 70–99)
Glucose-Capillary: 220 mg/dL — ABNORMAL HIGH (ref 70–99)

## 2021-11-26 NOTE — Progress Notes (Signed)
[place] [facility] to see if [facility] has reviewed pt's case and made a determination. Maudie Mercury states she will send referral to clinic tomorrow for review.   Melven Sartorius Renal Navigator [phone]

## 2021-11-26 NOTE — Plan of Care (Signed)
  Problem: Activity: Goal: Risk for activity intolerance will decrease 11/26/2021 0124 by Dorena Cookey, LPN Outcome: Progressing 11/26/2021 0123 by Dorena Cookey, LPN Outcome: Progressing   Problem: Pain Managment: Goal: General experience of comfort will improve 11/26/2021 0124 by Dorena Cookey, LPN Outcome: Progressing 11/26/2021 0123 by Dorena Cookey, LPN Outcome: Progressing

## 2021-11-26 NOTE — Progress Notes (Signed)
Occupational Therapy Treatment Patient Details Name: [name] MRN: [id number] DOB: 1965/03/27 Today's Date: 11/26/2021   History of present illness 56 y.o. female presented to ED 6/26 from dialysis with increased bloody drainage from L hip wound. 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 s/p hardware removal of left hip with partial resection of tuft tissue and femure that were nonviable on 08/10/21 Discharged home 6/23. underwent L hip debridement and wound vac placement 6/28; +Left intertrochanteric non-union,  Fracture of left distal femur  PMH: hypertension, hyperlipidemia, ESRD on HD MWF, history of left BKA in 2021, depression/anxiety, stroke, tobacco use, T2DM,  insomnia, chronic pain syndrome,   OT comments  Patient received in supine and declined getting into recliner today stating she would like to save her energy and try a transfer into her care tomorrow when her daughter brings her SUV. Patient agreeable to sitting on EOB for light grooming and UE HEP.  OT to attempt to see again tomorrow for car transfer. Acute OT to continue to follow.    Recommendations for follow up therapy are one component of a multi-disciplinary discharge planning process, led by the attending physician.  Recommendations may be updated based on patient status, additional functional criteria and insurance authorization.    Follow Up Recommendations  Skilled nursing-short term rehab (<3 hours/day)    Assistance Recommended at Discharge Intermittent Supervision/Assistance  Patient can return home with the following  A lot of help with bathing/dressing/bathroom;Assistance with cooking/housework;Assist for transportation;Help with stairs or ramp for entrance;Two people to help with walking and/or transfers   Equipment Recommendations  Other (comment) (TBD)    Recommendations for Other Services      Precautions / Restrictions Precautions Precautions: Fall;Other  (comment) Precaution Comments: L BKA (baseline) Restrictions Weight Bearing Restrictions: Yes LLE Weight Bearing: Weight bearing as tolerated       Mobility Bed Mobility Overal bed mobility: Needs Assistance Bed Mobility: Supine to Sit, Sit to Supine     Supine to sit: Supervision Sit to supine: Supervision   General bed mobility comments: increased time and no physical assistance    Transfers Overall transfer level: Needs assistance                 General transfer comment: declined transfers     Balance Overall balance assessment: Needs assistance Sitting-balance support: Feet supported Sitting balance-Leahy Scale: Good Sitting balance - Comments: no assistance with sitting balance                                   ADL either performed or assessed with clinical judgement   ADL Overall ADL's : Needs assistance/impaired     Grooming: Supervision/safety;Sitting                                 General ADL Comments: only wanted to perform light grooming    Extremity/Trunk Assessment              Vision       Perception     Praxis      Cognition Arousal/Alertness: Awake/alert Behavior During Therapy: WFL for tasks assessed/performed Overall Cognitive Status: Impaired/Different from baseline Area of Impairment: Awareness                         Safety/Judgement: Decreased awareness of  deficits Awareness: Emergent   General Comments: declined getting into recliner stating she wanted to save her energy to try car transfer tomorrow        Exercises Exercises: General Upper Extremity General Exercises - Upper Extremity Shoulder Flexion: Strengthening, Both, 10 reps, Theraband, Seated Theraband Level (Shoulder Flexion): Level 2 (Red) Shoulder ABduction: Strengthening, Both, 15 reps, Theraband, Seated Theraband Level (Shoulder Abduction): Level 2 (Red) Elbow Flexion: Strengthening, Both, 10 reps,  Theraband, Seated Theraband Level (Elbow Flexion): Level 2 (Red) Elbow Extension: Strengthening, Both, 10 reps, Theraband, Seated Theraband Level (Elbow Extension): Level 2 (Red)    Shoulder Instructions       General Comments      Pertinent Vitals/ Pain       Pain Assessment Pain Assessment: Faces Faces Pain Scale: Hurts little more Pain Location: Right knee adn left hip Pain Descriptors / Indicators: Discomfort, Grimacing, Guarding Pain Intervention(s): Limited activity within patient's tolerance, Monitored during session, Repositioned  Home Living                                          Prior Functioning/Environment              Frequency  Min 2X/week        Progress Toward Goals  OT Goals(current goals can now be found in the care plan section)  Progress towards OT goals: Progressing toward goals  Acute Rehab OT Goals Patient Stated Goal: go home OT Goal Formulation: With patient Time For Goal Achievement: 12/03/21 Potential to Achieve Goals: Fair ADL Goals Pt Will Perform Grooming: with modified independence;sitting Pt Will Perform Lower Body Bathing: with supervision;sitting/lateral leans;with adaptive equipment Pt Will Perform Lower Body Dressing: with adaptive equipment;sitting/lateral leans;with supervision Pt Will Transfer to Toilet: with min assist;with +2 assist;bedside commode;with transfer board Pt Will Perform Toileting - Clothing Manipulation and hygiene: with min assist;sitting/lateral leans Pt/caregiver will Perform Home Exercise Program: Increased ROM;Increased strength;Both right and left upper extremity;With written HEP provided;Independently;With theraband Additional ADL Goal #1: Pt will be able to tolerate sitting up in chair for 2 hours in order to improve activity tolerance for ADLs/IADLs Additional ADL Goal #2: Pt will be able to don prosthesis with min A in prep for ADLs/mobility  Plan Discharge plan remains  appropriate;Frequency remains appropriate    Co-evaluation                 AM-PAC OT "6 Clicks" Daily Activity     Outcome Measure   Help from another person eating meals?: None Help from another person taking care of personal grooming?: None Help from another person toileting, which includes using toliet, bedpan, or urinal?: A Lot Help from another person bathing (including washing, rinsing, drying)?: A Lot Help from another person to put on and taking off regular upper body clothing?: A Little Help from another person to put on and taking off regular lower body clothing?: A Lot 6 Click Score: 17    End of Session    OT Visit Diagnosis: Muscle weakness (generalized) (M62.81);Unsteadiness on feet (R26.81);History of falling (Z91.81)   Activity Tolerance Patient tolerated treatment well   Patient Left in bed;with call bell/phone within reach;with bed alarm set   Nurse Communication Mobility status        Time: 2778-2423 OT Time Calculation (min): 36 min  Charges: OT General Charges $OT Visit: 1 Visit OT Treatments $Therapeutic Activity: 8-22  mins $Therapeutic Exercise: 8-22 mins  [name], OTA Acute Rehabilitation Services  Office [phone]   [name] 11/26/2021, 2:59 PM

## 2021-11-26 NOTE — Progress Notes (Signed)
[name] Progress Note   Assessment/ Plan:   Outpatient Dialysis Orders: MWF at [place], 400/500, EDW 90kg, 2K/2Ca, LUE AVF -heparin 4000 unit IV bolus - No ESA   Assessment/Plan: Recurrent L hip osteomyelitis: S/p 08/10/21 surgery (removal hip IMN which was placed 12/2019) with persistent VRE infection. Back to OR 08/28/21, 08/31/21 for L hip debridement and wound vac placement (now removed). Finished 6 week course of IV Daptomycin on 10/12/21. Management per primary team.  Chronic L hip/stump pain: She does have fractures of L femoral neck (where hardware was removed) as well as distal femur Fx. Pain control per primary.  ESRD: Continue HD on usual MWF - see dispo below Hyperkalemia: Recurrent issues, on 2K bath + furosemide. Lokelma 10g added on non-HD days. K+ improved overall AVF dysfunction: S/p branch ligation in OR 09/19/2021 per Dr. Virl Cagey. Was having intermittent cannulation issues, now working well. HTN/volume: BP controlled on amlodipine/hydralazine/Coreg. Edema  still present  - reminded on fluid restrictions (exceeds). Getting ~4.5L UFG q HD. Anemia of ESRD: Hgb at goal, not on ESA.  Secondary HPTH: PTH/Ca. Not on VDRA. Phos mildly elevated, continue binders Nutrition: Alb low, continue protein supplements. DM2: Insulin per primary Dispo: SNF previously recommended, now she would like to go home on discharge even though HD unit has been clear that they are prohibited by company policy to assist patient out of her car and into the facility. Patient is aware of this-says she is going to work with transfer with PT.  Asks about home HD but they would NOT be able to train her since she is in the hospital. Dialyzed in recliner on 9/4 but signed off early due to pain, has declined to come in a recliner since then.   Madelon Lips MD [facility] Pager: [phone]  Subjective:    No issues today.   Objective:   BP (!) 132/56   Pulse  61   Temp 97.7 F (36.5 C) (Oral)   Resp 18   Ht 5\' 10"  (1.778 m)   Wt 91.5 kg   SpO2 95%   BMI 28.94 kg/m   Physical Exam: Gen:NAD, sitting in bed CVS: RRR  PULM: clear Abd: soft Ext: RLE 2+ LE edema, LLE bent at knee, externally rotated.  2+ edema ACCESS: L AVF + T/B  Labs: BMET Recent Labs  Lab 11/20/21 0758 11/22/21 0800 11/25/21 0749 11/25/21 1426  NA 124* 126* 129* 131*  K 4.7 4.8 5.5* 4.2  CL 84* 88* 89* 94*  CO2 25 28 25 28   GLUCOSE 198* 182* 176* 195*  BUN 80* 71* 97* 38*  CREATININE 6.20* 6.24* 6.65* 3.64*  CALCIUM 8.8* 9.1 9.2 9.2  PHOS 6.6* 6.3* 7.0*  --    CBC Recent Labs  Lab 11/20/21 0757 11/22/21 0759 11/25/21 0749 11/25/21 1426  WBC 5.9 5.9 6.7 6.3  HGB 11.2* 10.9* 10.4* 12.2  HCT 33.0* 33.8* 31.6* 35.8*  MCV 83.5 84.9 82.9 82.7  PLT 200 199 193 198      Medications:     (feeding supplement) PROSource Plus  30 mL Oral BID BM   alosetron  1 mg Oral BID   amitriptyline  25 mg Oral QHS   amLODipine  10 mg Oral Q24H   atorvastatin  40 mg Oral q1800   carvedilol  6.25 mg Oral BID   cyclobenzaprine  10 mg Oral TID   docusate sodium  200 mg Oral BID   DULoxetine  60 mg Oral  Daily   famotidine  20 mg Oral Daily   furosemide  80 mg Oral Daily   heparin  5,000 Units Subcutaneous Q8H   hydrALAZINE  25 mg Oral TID   insulin aspart  0-9 Units Subcutaneous TID WC   insulin aspart  4 Units Subcutaneous TID WC   insulin glargine-yfgn  7 Units Subcutaneous QHS   multivitamin  1 tablet Oral QHS   pantoprazole  80 mg Oral Daily   polyethylene glycol  17 g Oral Daily   sevelamer carbonate  2,400 mg Oral TID WC   sodium zirconium cyclosilicate  10 g Oral Once per day on Sun Tue Thu Sat     Madelon Lips MD 11/26/2021, 3:40 PM

## 2021-11-26 NOTE — Progress Notes (Signed)
PT Cancellation Note  Patient Details Name: [name] MRN: [id number] DOB: Sep 21, 1965   Cancelled Treatment:    Reason Eval/Treat Not Completed: (P) Pain limiting ability to participate;Fatigue/lethargy limiting ability to participate Pt reports to OT desire to try car transfers with daughter tomorrow and wants to save her energy for that. PT will follow back tomorrow.  Fawne Hughley B. [name] PT, DPT Acute Rehabilitation Services Please use secure chat or  Call Office [phone]    [place] 11/26/2021, 1:17 PM

## 2021-11-26 NOTE — Progress Notes (Signed)
Progress Note   Patient: [name] ZGY:[id number] DOB: 03/20/1965 DOA: 08/26/2021     92 DOS: the patient was seen and examined on 11/26/2021   Brief hospital course: DUTCHESS CROSLAND is a 56 y.o. female with a history of ESRD, anemia of CKD, diabetes mellitus type 2, hyperlipidemia, CVA, anxiety, depression, GERD, tobacco, left hip fracture s/p IM nail placement and recent hardware removal. Patient presented secondary to left hip bleeding and found to have evidence of subacute osteomyelitis and abscess of left femur. Orthopedic surgery/infectious disease consulted. Patient was started on empiric antibiotics and underwent multiple I&Ds. Patient completed a 6 week course of daptomycin. Discharge is pending ability for patient to tolerate HD in a chair which is complicated by patient's refusal/inability to sit in a chair secondary to pain. Patient tolerated HD in a chair on 9/4.  Assessment and Plan: ESRD on HD MWF/anasarca -Per nephrology. -Has to do HD in chair for outpatient HD, which pt has apparently been refusing thus far Per Nephrology, now states she is willing to work with PT. TOC is following for disposition -Cont to encourage fluid restriction   Subacute osteomyelitis of left femur with abscess/infected left hip hardware s/p removal on 6/10 prior to admission with I&D on 6/28 and 7/1 -Completed 6 weeks of appropriate antibiotics per ID recommendation.   -CT hip on 9/2 with fluid collection and soft tissue thickening.   -Ortho rec-outpatient f/up with Dr. [name] 1 wk after discharge.  Low suspicion for persistent infection. -Pain control   History of left intertrochanteric fracture: previously managed with IM nail placement in 2021. Hardware removed secondary to above.   Hyponatremia/Hypokalemia/Hyperkalemia/hypomagnesemia -Management with hemodialysis   ABLA superimposed on anemia of chronic disease: Stable. -Monitor intermittently   Controlled IDDM-2 with hyperglycemia and  ESRD: A1c 5.9%. -Continue SSI-sensitive -Continue NovoLog 4 units 3 times daily with meals -Stable on Semglee 7 units at night -Glucose trends stable    Primary hypertension -Continue amlodipine, hydralazine and Coreg   History of CVA/HLD -Continue Lipitor   Right knee pain: Likely osteoarthritis.  Patient reported worsening right knee pain to therapy and refused to participate -Xray of from 9/19 reviewed. Neg -Continue pain meds   GERD -Continue Pepcid and Protonix   Tobacco use -Tobacco cessation discussed during this admission.   Anxiety and depression: -Continue home meds -Seems stable      Subjective: No complaints this AM  Physical Exam: Vitals:   11/25/21 2028 11/25/21 2156 11/26/21 0526 11/26/21 0952  BP: (!) 143/58  (!) 127/49 (!) 132/56  Pulse: 65  71 61  Resp:   16 18  Temp:   98.4 F (36.9 C) 97.7 F (36.5 C)  TempSrc:   Oral Oral  SpO2:  100% 99% 95%  Weight:      Height:       General exam: Conversant, in no acute distress Respiratory system: normal chest rise, clear, no audible wheezing Cardiovascular system: regular rhythm, s1-s2 Gastrointestinal system: Nondistended, nontender, pos BS Central nervous system: No seizures, no tremors Extremities: No cyanosis, s/p LLE amputation Skin: No rashes, no pallor Psychiatry: Affect normal // no auditory hallucinations   Data Reviewed:  There are no new results to review at this time.  Family Communication:  Pt in room , family not at bedside  Disposition: Status is: Inpatient Remains inpatient appropriate because: Severity of illness  Planned Discharge Destination:  Unclear at this time     Author: Marylu Lund, MD 11/26/2021 12:44 PM  For on  call review [email or web address].

## 2021-11-27 DIAGNOSIS — M86252 Subacute osteomyelitis, left femur: Secondary | ICD-10-CM | POA: Diagnosis not present

## 2021-11-27 LAB — GLUCOSE, CAPILLARY
Glucose-Capillary: 147 mg/dL — ABNORMAL HIGH (ref 70–99)
Glucose-Capillary: 169 mg/dL — ABNORMAL HIGH (ref 70–99)
Glucose-Capillary: 165 mg/dL — ABNORMAL HIGH (ref 70–99)
Glucose-Capillary: 179 mg/dL — ABNORMAL HIGH (ref 70–99)

## 2021-11-27 LAB — RENAL FUNCTION PANEL
Albumin: 3.1 g/dL — ABNORMAL LOW (ref 3.5–5.0)
Anion gap: 12 (ref 5–15)
BUN: 78 mg/dL — ABNORMAL HIGH (ref 6–20)
CO2: 27 mmol/L (ref 22–32)
Calcium: 9.2 mg/dL (ref 8.9–10.3)
Chloride: 89 mmol/L — ABNORMAL LOW (ref 98–111)
Creatinine, Ser: 6 mg/dL — ABNORMAL HIGH (ref 0.44–1.00)
GFR, Estimated: 8 mL/min — ABNORMAL LOW (ref >60)
Glucose, Bld: 151 mg/dL — ABNORMAL HIGH (ref 70–99)
Phosphorus: 6.2 mg/dL — ABNORMAL HIGH (ref 2.5–4.6)
Potassium: 5 mmol/L (ref 3.5–5.1)
Sodium: 128 mmol/L — ABNORMAL LOW (ref 135–145)

## 2021-11-27 LAB — CBC
HCT: 32.1 % — ABNORMAL LOW (ref 36.0–46.0)
Hemoglobin: 10.5 g/dL — ABNORMAL LOW (ref 12.0–15.0)
MCH: 27.3 pg (ref 26.0–34.0)
MCHC: 32.7 g/dL (ref 30.0–36.0)
MCV: 83.6 fL (ref 80.0–100.0)
Platelets: 184 10*3/uL (ref 150–400)
RBC: 3.84 MIL/uL — ABNORMAL LOW (ref 3.87–5.11)
RDW: 15.4 % (ref 11.5–15.5)
WBC: 7.3 10*3/uL (ref 4.0–10.5)
nRBC: 0 % (ref 0.0–0.2)

## 2021-11-27 LAB — HEPATITIS B SURFACE ANTIGEN: Hepatitis B Surface Ag: NONREACTIVE

## 2021-11-27 MED ORDER — HEPARIN SODIUM (PORCINE) 1000 UNIT/ML IJ SOLN
4000.0000 [IU] | Freq: Once | INTRAMUSCULAR | Status: AC
  Administered 2021-11-27: 4000 [IU] via INTRAVENOUS
  Filled 2021-11-27: qty 4

## 2021-11-27 MED ORDER — PENTAFLUOROPROP-TETRAFLUOROETH EX AERO
INHALATION_SPRAY | CUTANEOUS | Status: AC
  Filled 2021-11-27: qty 30

## 2021-11-27 NOTE — Progress Notes (Signed)
Physical Therapy Treatment Patient Details Name: [name] MRN: [id number] DOB: 06/18/1965 Today's Date: 11/27/2021   History of Present Illness 56 y.o. female presented to ED 6/26 from dialysis with increased bloody drainage from L hip wound. 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 s/p hardware removal of left hip with partial resection of tuft tissue and femure that were nonviable on 08/10/21 Discharged home 6/23. underwent L hip debridement and wound vac placement 6/28; +Left intertrochanteric non-union,  Fracture of left distal femur  PMH: hypertension, hyperlipidemia, ESRD on HD MWF, history of left BKA in 2021, depression/anxiety, stroke, tobacco use, T2DM,  insomnia, chronic pain syndrome,    PT Comments    Patient tearful on arrival stating MD told her she could not go down to practice car transfer until she can do the transfer to the chair in the room. Convinced pt to work with PT/OT to work on standing and ability to transfer to chair so that we could eventually do car transfer. Patient explained that she normally pulls up on door of car and despite our attempts we were unable to simulate this way of transferring in her room. Discussed possible use of [facility] practice car for transfer training. Must get permission from CIR and typically can only use their equipment at noon when their patients are eating. Will follow-up with supervisor and tentatively have set time to see pt tomorrow at noon. [name] was unable to lift hips off the edge of bed during 2 attempts at standing with RW.  Attempted to don silicone sleeve for Lt BKA prosthesis and remains too swollen to be able to don.    Recommendations for follow up therapy are one component of a multi-disciplinary discharge planning process, led by the attending physician.  Recommendations may be updated based on patient status, additional functional criteria and insurance authorization.  Follow Up  Recommendations  Skilled nursing-short term rehab (<3 hours/day) Can patient physically be transported by private vehicle: No   Assistance Recommended at Discharge Intermittent Supervision/Assistance  Patient can return home with the following A lot of help with bathing/dressing/bathroom;Assistance with cooking/housework;Assist for transportation;Two people to help with walking and/or transfers;Help with stairs or ramp for entrance   Equipment Recommendations  None recommended by PT    Recommendations for Other Services       Precautions / Restrictions Precautions Precautions: Fall;Other (comment) Precaution Comments: L BKA (baseline) Restrictions Weight Bearing Restrictions: Yes LLE Weight Bearing: Weight bearing as tolerated Other Position/Activity Restrictions: L distal femur fx; per [name] 8/10 pt can full WB     Mobility  Bed Mobility Overal bed mobility: Needs Assistance Bed Mobility: Supine to Sit, Sit to Supine     Supine to sit: Supervision Sit to supine: Supervision   General bed mobility comments: increased time and no physical assistance    Transfers Overall transfer level: Needs assistance     Sit to Stand: From elevated surface           General transfer comment: pt wanted to try without therapist on either side of her because it is too confining. Pt unable to even clear buttocks off bed with attempt x 2 with RW stabilized and OT infront of her    Ambulation/Gait                   Stairs             Wheelchair Mobility    Modified Rankin (Stroke Patients Only)  Balance Overall balance assessment: Needs assistance Sitting-balance support: Feet supported Sitting balance-Leahy Scale: Good Sitting balance - Comments: no assistance with sitting balance   Standing balance support: Bilateral upper extremity supported, During functional activity, Reliant on assistive device for balance Standing balance-Leahy Scale:  Poor Standing balance comment: reliant on RW for support                            Cognition Arousal/Alertness: Awake/alert Behavior During Therapy: Anxious (tearful) Overall Cognitive Status: Impaired/Different from baseline Area of Impairment: Awareness                         Safety/Judgement: Decreased awareness of deficits, Decreased awareness of safety Awareness: Emergent   General Comments: poor awareness of her current level of weakness and impairments (i.e. arranged for daughter to bring SUV to practice car transfers when has been unable to transfer via pivot to chair)        Exercises Other Exercises Other Exercises: left knee extension PROM with pt pushing her knee straight;  pt with ~45 degree flexion contracture Other Exercises: AROM left hip internal rotation with pt achieving ~45 degree external rotation    General Comments General comments (skin integrity, edema, etc.): Attempted to don silicone sleeve onto lt BKA with inabiltiy to do so due to edema      Pertinent Vitals/Pain Pain Assessment Pain Assessment: Faces Faces Pain Scale: Hurts little more Pain Location: left hip Pain Descriptors / Indicators: Discomfort, Grimacing, Guarding Pain Intervention(s): Limited activity within patient's tolerance, Monitored during session, Premedicated before session    Home Living Family/patient expects to be discharged to:: Private residence Living Arrangements: Children Available Help at Discharge: Family;Available PRN/intermittently Type of Home: Mobile home Home Access: Ramped entrance       Home Layout: One level Home Equipment: Wheelchair - manual;BSC/3in1;Rollator (4 wheels)      Prior Function            PT Goals (current goals can now be found in the care plan section) Acute Rehab PT Goals Patient Stated Goal: less pain with movement Time For Goal Achievement: 12/05/21 Potential to Achieve Goals: Fair Progress towards PT  goals: Not progressing toward goals - comment    Frequency    Min 2X/week      PT Plan Current plan remains appropriate    Co-evaluation PT/OT/SLP Co-Evaluation/Treatment: Yes Reason for Co-Treatment: Complexity of the patient's impairments (multi-system involvement);For patient/therapist safety;To address functional/ADL transfers          AM-PAC PT "6 Clicks" Mobility   Outcome Measure  Help needed turning from your back to your side while in a flat bed without using bedrails?: None Help needed moving from lying on your back to sitting on the side of a flat bed without using bedrails?: None Help needed moving to and from a bed to a chair (including a wheelchair)?: A Little Help needed standing up from a chair using your arms (e.g., wheelchair or bedside chair)?: Total Help needed to walk in hospital room?: Total Help needed climbing 3-5 steps with a railing? : Total 6 Click Score: 14    End of Session Equipment Utilized During Treatment: Gait belt Activity Tolerance: Patient limited by pain;Other (comment) (tearful and upset) Patient left: in bed;with call bell/phone within reach;with family/visitor present Nurse Communication: Mobility status PT Visit Diagnosis: Other abnormalities of gait and mobility (R26.89);Muscle weakness (generalized) (M62.81);History of falling (Z91.81);Pain Pain -  Right/Left: Left Pain - part of body: Leg     Time: 6384-6659 PT Time Calculation (min) (ACUTE ONLY): 33 min  Charges:  $Therapeutic Activity: 23-37 mins                      Arby Barrette, PT Acute Rehabilitation Services  Office [phone]    [name] 11/27/2021, 2:40 PM

## 2021-11-27 NOTE — Progress Notes (Signed)
Patient refused to go to HD and sit in recliner

## 2021-11-27 NOTE — Progress Notes (Signed)
Pt's primary nurse called me back to make me aware that pt is refusing, again, to come to HD tx in recliner

## 2021-11-27 NOTE — Progress Notes (Signed)
Received patient in bed to unit.  Alert and oriented.  Informed consent signed and in chart.   Treatment initiated: 8325 Treatment completed: 1243  Patient tolerated well.  Transported back to the room  Alert, without acute distress.  Hand-off given to patient's nurse.   Access used: fistula Access issues: none  Total UF removed: 4000 Medication(s) given: none Post HD VS: 98.5, 131/60(79), HR-62, RR-10, SP02-99 Post HD weight: 91.6kg   [facility]

## 2021-11-27 NOTE — Progress Notes (Signed)
[facility] Progress Note   Assessment/ Plan:   Outpatient Dialysis Orders: MWF at [place], 400/500, EDW 90kg, 2K/2Ca, LUE AVF -heparin 4000 unit IV bolus - No ESA   Assessment/Plan: Recurrent L hip osteomyelitis: S/p 08/10/21 surgery (removal hip IMN which was placed 12/2019) with persistent VRE infection. Back to OR 08/28/21, 08/31/21 for L hip debridement and wound vac placement (now removed). Finished 6 week course of IV Daptomycin on 10/12/21. Management per primary team.  Chronic L hip/stump pain: She does have fractures of L femoral neck (where hardware was removed) as well as distal femur Fx. Pain control per primary.  ESRD: Continue HD on usual MWF -HD today 9/27 with 4L UF goal Hyperkalemia: Recurrent issues, on 2K bath + furosemide. Lokelma 10g added on non-HD days. K+ improved overall AVF dysfunction: S/p branch ligation in OR 09/19/2021 per Dr. Virl Cagey. Was having intermittent cannulation issues, now working well. HTN/volume: BP controlled on amlodipine/hydralazine/Coreg. Edema  still present  - reminded on fluid restrictions (exceeds). Getting ~4.5L UFG q HD. Anemia of ESRD: Hgb at goal, not on ESA.  Secondary HPTH: PTH/Ca. Not on VDRA. Phos mildly elevated, continue binders Nutrition: Alb low, continue protein supplements. DM2: Insulin per primary Dispo: SNF previously recommended, now she would like to go home on discharge even though HD unit has been clear that they are prohibited by company policy to assist patient out of her car and into the facility. Patient is aware of this-says she is going to work with transfer with PT.  Asks about home HD but they would NOT be able to train her since she is in the hospital. Dialyzed in recliner on 9/4 but signed off early due to pain, has declined to come in a recliner since then.   Madelon Lips MD [facility] Pager: [phone]  Subjective:    Pt has declined to come in a recliner again.   No complaints.     Objective:   BP (!) 116/58   Pulse 63   Temp 98.3 F (36.8 C) (Oral)   Resp (!) 7   Ht 5\' 10"  (1.778 m)   Wt 95.6 kg   SpO2 92%   BMI 30.24 kg/m   Physical Exam: Gen:NAD, sitting in bed CVS: RRR  PULM: clear Abd: soft Ext: RLE 2+ LE edema, LLE bent at knee, externally rotated.  2+ edema ACCESS: L AVF + T/B  Labs: BMET Recent Labs  Lab 11/22/21 0800 11/25/21 0749 11/25/21 1426 11/27/21 0841  NA 126* 129* 131* 128*  K 4.8 5.5* 4.2 5.0  CL 88* 89* 94* 89*  CO2 28 25 28 27   GLUCOSE 182* 176* 195* 151*  BUN 71* 97* 38* 78*  CREATININE 6.24* 6.65* 3.64* 6.00*  CALCIUM 9.1 9.2 9.2 9.2  PHOS 6.3* 7.0*  --  6.2*   CBC Recent Labs  Lab 11/22/21 0759 11/25/21 0749 11/25/21 1426 11/27/21 0842  WBC 5.9 6.7 6.3 7.3  HGB 10.9* 10.4* 12.2 10.5*  HCT 33.8* 31.6* 35.8* 32.1*  MCV 84.9 82.9 82.7 83.6  PLT 199 193 198 184      Medications:     (feeding supplement) PROSource Plus  30 mL Oral BID BM   alosetron  1 mg Oral BID   amitriptyline  25 mg Oral QHS   amLODipine  10 mg Oral Q24H   atorvastatin  40 mg Oral q1800   carvedilol  6.25 mg Oral BID   cyclobenzaprine  10 mg Oral TID  docusate sodium  200 mg Oral BID   DULoxetine  60 mg Oral Daily   famotidine  20 mg Oral Daily   furosemide  80 mg Oral Daily   heparin  5,000 Units Subcutaneous Q8H   heparin sodium (porcine)  4,000 Units Intravenous Once   hydrALAZINE  25 mg Oral TID   insulin aspart  0-9 Units Subcutaneous TID WC   insulin aspart  4 Units Subcutaneous TID WC   insulin glargine-yfgn  7 Units Subcutaneous QHS   multivitamin  1 tablet Oral QHS   pantoprazole  80 mg Oral Daily   pentafluoroprop-tetrafluoroeth       polyethylene glycol  17 g Oral Daily   sevelamer carbonate  2,400 mg Oral TID WC   sodium zirconium cyclosilicate  10 g Oral Once per day on Sun Tue Thu Sat     Madelon Lips MD 11/27/2021, 10:09 AM

## 2021-11-27 NOTE — Procedures (Signed)
Patient seen and examined on Hemodialysis. BP (!) 116/58   Pulse 63   Temp 98.3 F (36.8 C) (Oral)   Resp (!) 7   Ht 5\' 10"  (1.778 m)   Wt 95.6 kg   SpO2 92%   BMI 30.24 kg/m   QB 400 mL/ min via AVF, UF goal 4L  Tolerating treatment without complaints at this time.   Madelon Lips MD [facility] Pgr [phone] 10:10 AM

## 2021-11-27 NOTE — Plan of Care (Signed)
  Problem: Activity: Goal: Risk for activity intolerance will decrease Outcome: Progressing   Problem: Pain Managment: Goal: General experience of comfort will improve Outcome: Progressing   

## 2021-11-27 NOTE — Progress Notes (Signed)
Occupational Therapy Treatment Patient Details Name: [name] MRN: [id number] DOB: 28-Dec-1965 Today's Date: 11/27/2021   History of present illness 56 y.o. female presented to ED 6/26 from dialysis with increased bloody drainage from L hip wound. 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 s/p hardware removal of left hip with partial resection of tuft tissue and femure that were nonviable on 08/10/21 Discharged home 6/23. underwent L hip debridement and wound vac placement 6/28; +Left intertrochanteric non-union,  Fracture of left distal femur  PMH: hypertension, hyperlipidemia, ESRD on HD MWF, history of left BKA in 2021, depression/anxiety, stroke, tobacco use, T2DM,  insomnia, chronic pain syndrome,   OT comments  Patient tearful upon arrival due to MD stating she could not perform car transfers until she perform transfers in room. Patient agreed to participate with OT/PT with standing to increase transfers and work towards being able to perform car transfers. Patient unable to clear bottom. Discussed using car in CIR for practice, will follow up this week.    Recommendations for follow up therapy are one component of a multi-disciplinary discharge planning process, led by the attending physician.  Recommendations may be updated based on patient status, additional functional criteria and insurance authorization.    Follow Up Recommendations  Skilled nursing-short term rehab (<3 hours/day)    Assistance Recommended at Discharge Intermittent Supervision/Assistance  Patient can return home with the following  A lot of help with bathing/dressing/bathroom;Assistance with cooking/housework;Assist for transportation;Help with stairs or ramp for entrance;Two people to help with walking and/or transfers   Equipment Recommendations  Other (comment) (TBD)    Recommendations for Other Services      Precautions / Restrictions Precautions Precautions: Fall;Other  (comment) Precaution Comments: L BKA (baseline) Restrictions Weight Bearing Restrictions: Yes LLE Weight Bearing: Weight bearing as tolerated Other Position/Activity Restrictions: L distal femur fx; per Dr [name] 8/10 pt can full WB       Mobility Bed Mobility Overal bed mobility: Needs Assistance Bed Mobility: Supine to Sit     Supine to sit: Supervision     General bed mobility comments: increased time and no physical assistance    Transfers Overall transfer level: Needs assistance     Sit to Stand: From elevated surface           General transfer comment: wanted to attempt diffirent options for sit to stand but unable to clear bottom due to compaints of pain and weakness     Balance Overall balance assessment: Needs assistance Sitting-balance support: Feet supported Sitting balance-Leahy Scale: Good Sitting balance - Comments: no assistance with sitting balance   Standing balance support: Bilateral upper extremity supported, During functional activity, Reliant on assistive device for balance Standing balance-Leahy Scale: Poor Standing balance comment: reliant on RW for support                           ADL either performed or assessed with clinical judgement   ADL Overall ADL's : Needs assistance/impaired                                       General ADL Comments: focused on transfers    Extremity/Trunk Assessment     Lower Extremity Assessment RLE Deficits / Details: hip, knee and ankle ROM WFL, hip flex 4/5, knee flex 4/5, knee ext 3+/5, ankle dorsi/plantarflex 4/5 LLE Deficits /  Details: s/p BKA (2021). tendency to rest in external rotation and adduction, unable to flex/extend L knee due to fx   Cervical / Trunk Assessment Cervical / Trunk Assessment: Normal    Vision       Perception     Praxis      Cognition Arousal/Alertness: Awake/alert Behavior During Therapy: Anxious (tearful) Overall Cognitive Status:  Impaired/Different from baseline Area of Impairment: Awareness                         Safety/Judgement: Decreased awareness of deficits, Decreased awareness of safety Awareness: Emergent   General Comments: tearful of not being able to address car transfer but unable to stand in room with car transfer simulated        Exercises      Shoulder Instructions       General Comments Attempted to don silicone sleeve onto lt BKA with inabiltiy to do so due to edema    Pertinent Vitals/ Pain       Pain Assessment Pain Assessment: Faces Faces Pain Scale: Hurts little more Pain Location: left hip Pain Descriptors / Indicators: Discomfort, Grimacing, Guarding Pain Intervention(s): Limited activity within patient's tolerance, Repositioned, Premedicated before session  Home Living Family/patient expects to be discharged to:: Private residence Living Arrangements: Children Available Help at Discharge: Family;Available PRN/intermittently Type of Home: Mobile home Home Access: Ramped entrance     Home Layout: One level     Bathroom Shower/Tub: Occupational psychologist: Standard Bathroom Accessibility: No   Home Equipment: Tax adviser (4 wheels)          Prior Functioning/Environment              Frequency  Min 2X/week        Progress Toward Goals  OT Goals(current goals can now be found in the care plan section)  Progress towards OT goals: Progressing toward goals  Acute Rehab OT Goals Patient Stated Goal: go home OT Goal Formulation: With patient Time For Goal Achievement: 12/03/21 Potential to Achieve Goals: Fair ADL Goals Pt Will Perform Grooming: with modified independence;sitting Pt Will Perform Lower Body Bathing: with supervision;sitting/lateral leans;with adaptive equipment Pt Will Perform Lower Body Dressing: with adaptive equipment;sitting/lateral leans;with supervision Pt Will Transfer to Toilet: with  min assist;with +2 assist;bedside commode;with transfer board Pt Will Perform Toileting - Clothing Manipulation and hygiene: with min assist;sitting/lateral leans Pt/caregiver will Perform Home Exercise Program: Increased ROM;Increased strength;Both right and left upper extremity;With written HEP provided;Independently;With theraband Additional ADL Goal #1: Pt will be able to tolerate sitting up in chair for 2 hours in order to improve activity tolerance for ADLs/IADLs Additional ADL Goal #2: Pt will be able to don prosthesis with min A in prep for ADLs/mobility  Plan Discharge plan remains appropriate;Frequency remains appropriate    Co-evaluation    PT/OT/SLP Co-Evaluation/Treatment: Yes Reason for Co-Treatment: Complexity of the patient's impairments (multi-system involvement);For patient/therapist safety;To address functional/ADL transfers   OT goals addressed during session: Other (comment) (sit to stands)      AM-PAC OT "6 Clicks" Daily Activity     Outcome Measure   Help from another person eating meals?: None Help from another person taking care of personal grooming?: None Help from another person toileting, which includes using toliet, bedpan, or urinal?: A Lot Help from another person bathing (including washing, rinsing, drying)?: A Lot Help from another person to put on and taking off regular upper body clothing?: A Little  Help from another person to put on and taking off regular lower body clothing?: A Lot 6 Click Score: 17    End of Session    OT Visit Diagnosis: Muscle weakness (generalized) (M62.81);Unsteadiness on feet (R26.81);History of falling (Z91.81)   Activity Tolerance Other (comment) (patient tearful and discoraged during session)   Patient Left in bed;with call bell/phone within reach;Other (comment) (PT in room)   Nurse Communication Mobility status        Time: 1350-1416 OT Time Calculation (min): 26 min  Charges: OT General Charges $OT Visit:  1 Visit OT Treatments $Therapeutic Activity: 23-37 mins  [name], OTA Acute Rehabilitation Services  [facility] 11/27/2021, 3:21 PM

## 2021-11-27 NOTE — Progress Notes (Addendum)
Progress Note   Patient: [name] SFK:[id number] DOB: December 05, 1965 DOA: 08/26/2021     93 DOS: the patient was seen and examined on 11/27/2021   Brief hospital course: 56 year old white female HTN HLD IDDM ESRD MWF Left BKA 2021, 01/12/2020 for chronic calcaneus osteomyelitis (prior toe amputation Chopart potation 2014 2015) status post IM nail 12/29/2019--underwent irrigation debridement left hip previously 01/19/2020 and was treated with ertapenem 2/2 Enterobacter/ Bacteroides Bipolar   Patient fell hit the trying to transfer from wheelchair to recliner 5/19 witnessed--found to have distal femoral fracture deemed nonoperative she was admitted 6/3-through 6/23 and underwent partial resection of femur for osteomyelitis of infected soft tissue and rearrangement of bony fragments 20 x 15 cm and was on antibiotics as guided by ID for cultures growing Staph aureus   Patient presented secondary to copious left hip bleeding and found to have evidence of subacute osteomyelitis and abscess of left femur. Orthopedic surgery/infectious disease consulted. Patient was started on empiric antibiotics and underwent multiple I&Ds by Dr. [name] on 1/74, had application of Kerecis micro powder 7/1 with local tissue rearrangement 40 x 10 cm  She was also seen by vascular surgery who did a fistula branch ligation  Patient completed a 6 week course of daptomycin 10/09/21  Discharge is pending ability for patient to tolerate HD in a chair which is complicated by patient's refusal/inability to sit in a chair secondary to pain. Patient tolerated HD in a chair on 9/4.  Assessment and Plan: ESRD on HD MWF/anasarca -Per nephrology.  TOC social worker for dialysis is talking with other kidney centers -Still on Lasix 80 daily-unclear if need to continue the same, continuing calcium carbonate 200 every 2 as needed, Renvela 2400 3 times daily -Has to do HD in chair for outpatient HD,--continue to wait heroic efforts of  therapy services to help her mobilize -Cont to encourage fluid restriction   Subacute osteomyelitis of left femur with abscess/infected left hip hardware s/p removal on 6/10 prior to admission with I&D on 6/28 and 7/1 -Completed 6 weeks of appropriate antibiotics per ID recommendation.  -CT hip on 9/2 with fluid collection and soft tissue thickening.   -Ortho rec-outpatient f/up with Dr. [name] 1 wk after discharge.  Low suspicion for persistent infection. -Pain control with Oxy IR 15 every 3 as needed may be better off switching to Dilaudid or fentanyl given dialysis.  Patient   History of left intertrochanteric fracture: previously managed with IM nail placement in 2021. Hardware removed secondary to above.   Hyponatremia/Hypokalemia/Hyperkalemia/hypomagnesemia -Management with hemodialysis-will need labs checked again post HD in a.m. given mild hyponatremia and hyperkalemia   ABLA superimposed on anemia of chronic disease: Stable. -Monitor intermittently   Controlled IDDM-2 with hyperglycemia and ESRD: A1c 5.9%. -Continue SSI-sensitive -Continue NovoLog 4 units 3 times daily with meals -Stable on Semglee 7 units at night -Glucose trends stable    Primary hypertension -Continue amlodipine 10 mg, hydralazine 25 3 times daily and Coreg 6.25 twice daily   History of CVA/HLD -Continue Lipitor   Right knee pain: Likely osteoarthritis.  Patient reported worsening right knee pain to therapy and refused to participate -Xray of from 9/19 reviewed. Neg -Continue pain meds   GERD -Continue Pepcid and Protonix   Tobacco use -Tobacco cessation discussed during this admission.   Anxiety and depression: -Continue amitriptyline 25 Cymbalta DR 60 -Seems stable      Subjective: Seen on HD unit-quite upset in general-unclear why  Physical Exam: Vitals:   11/27/21 1326  11/27/21 1338 11/27/21 1430 11/27/21 1613  BP: (!) 143/55   (!) 131/53  Pulse: 65   65  Resp: 16 18 18 18   Temp:  98.3 F (36.8 C)   98.6 F (37 C)  TempSrc: Oral   Oral  SpO2: 100%   97%  Weight:      Height:       Coherent awake no distress no LAM no icterus no pallor Chest clear no added sound rales rhonchi S1-S2 no murmur Abdomen soft nontender bowel sounds heard no rebound no guarding Power is 5/5 grossly full exam deferred no tremors No cyanosis left lower extremity amputation noted with scar that is seemingly well-healed no edema Depressed affect  Data Reviewed:  There are no new results to review at this time.  Family Communication:  Pt in room , family not at bedside  Disposition: Status is: Inpatient Remains inpatient appropriate because: Severity of illness  Planned Discharge Destination:  Unclear at this time     Author: Nita Sells, MD 11/27/2021 4:46 PM  For on call review [email or web address].

## 2021-11-28 LAB — CBC WITH DIFFERENTIAL/PLATELET
Abs Immature Granulocytes: 0.02 10*3/uL (ref 0.00–0.07)
Basophils Absolute: 0.1 10*3/uL (ref 0.0–0.1)
Basophils Relative: 1 %
Eosinophils Absolute: 0.2 10*3/uL (ref 0.0–0.5)
Eosinophils Relative: 3 %
HCT: 34.4 % — ABNORMAL LOW (ref 36.0–46.0)
Hemoglobin: 11.1 g/dL — ABNORMAL LOW (ref 12.0–15.0)
Immature Granulocytes: 0 %
Lymphocytes Relative: 25 %
Lymphs Abs: 1.9 10*3/uL (ref 0.7–4.0)
MCH: 27.5 pg (ref 26.0–34.0)
MCHC: 32.3 g/dL (ref 30.0–36.0)
MCV: 85.4 fL (ref 80.0–100.0)
Monocytes Absolute: 1.2 10*3/uL — ABNORMAL HIGH (ref 0.1–1.0)
Monocytes Relative: 16 %
Neutro Abs: 4.1 10*3/uL (ref 1.7–7.7)
Neutrophils Relative %: 55 %
Platelets: 181 10*3/uL (ref 150–400)
RBC: 4.03 MIL/uL (ref 3.87–5.11)
RDW: 15.2 % (ref 11.5–15.5)
WBC: 7.4 10*3/uL (ref 4.0–10.5)
nRBC: 0 % (ref 0.0–0.2)

## 2021-11-28 LAB — RENAL FUNCTION PANEL
Albumin: 3.2 g/dL — ABNORMAL LOW (ref 3.5–5.0)
Anion gap: 15 (ref 5–15)
BUN: 49 mg/dL — ABNORMAL HIGH (ref 6–20)
CO2: 29 mmol/L (ref 22–32)
Calcium: 9.9 mg/dL (ref 8.9–10.3)
Chloride: 88 mmol/L — ABNORMAL LOW (ref 98–111)
Creatinine, Ser: 4.44 mg/dL — ABNORMAL HIGH (ref 0.44–1.00)
GFR, Estimated: 11 mL/min — ABNORMAL LOW (ref >60)
Glucose, Bld: 126 mg/dL — ABNORMAL HIGH (ref 70–99)
Phosphorus: 4.7 mg/dL — ABNORMAL HIGH (ref 2.5–4.6)
Potassium: 4.4 mmol/L (ref 3.5–5.1)
Sodium: 132 mmol/L — ABNORMAL LOW (ref 135–145)

## 2021-11-28 LAB — GLUCOSE, CAPILLARY
Glucose-Capillary: 174 mg/dL — ABNORMAL HIGH (ref 70–99)
Glucose-Capillary: 167 mg/dL — ABNORMAL HIGH (ref 70–99)
Glucose-Capillary: 160 mg/dL — ABNORMAL HIGH (ref 70–99)
Glucose-Capillary: 100 mg/dL — ABNORMAL HIGH (ref 70–99)

## 2021-11-28 MED ORDER — SODIUM ZIRCONIUM CYCLOSILICATE 10 G PO PACK
10.0000 g | PACK | ORAL | Status: DC
  Administered 2021-11-30 – 2022-03-16 (×42): 10 g via ORAL
  Filled 2021-11-28 (×56): qty 1

## 2021-11-28 MED ORDER — CHLORHEXIDINE GLUCONATE CLOTH 2 % EX PADS
6.0000 | MEDICATED_PAD | Freq: Every day | CUTANEOUS | Status: DC
  Administered 2021-11-29: 6 via TOPICAL

## 2021-11-28 NOTE — Progress Notes (Signed)
Physical Therapy Treatment Patient Details Name: [name] MRN: [id number] DOB: Sep 21, 1965 Today's Date: 11/28/2021   History of Present Illness 56 y.o. female presented to ED 6/26 from dialysis with increased bloody drainage from L hip wound. 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 s/p hardware removal of left hip with partial resection of tuft tissue and femure that were nonviable on 08/10/21 Discharged home 6/23. underwent L hip debridement and wound vac placement 6/28; +Left intertrochanteric non-union,  Fracture of left distal femur  PMH: hypertension, hyperlipidemia, ESRD on HD MWF, history of left BKA in 2021, depression/anxiety, stroke, tobacco use, T2DM,  insomnia, chronic pain syndrome,    PT Comments    Obtained permission from CIR to use their equipment and pt transferred to [facility] gym by wheelchair. (She completed bed to w/c transfer with sliding board with min assist). Attempted standing in parallel bars x 2 with +2 mod assist with pt unable to come to full standing due to right knee and then ankle pain. Again discussed if pt could use wheelchair inside her home, which she can. However, she would need to be able to drive herself to dialysis and cannot yet do car transfer. Her car is an SUV, making a sliding board transfer nearly impossible. She would perhaps have access to her daughter's car if using the sliding board for transfers. Patient became tearful and again encouraged her to consider SNF for additional therapies (increased frequency, length of sessions, and availability of additional equipment like parallel bars).      Recommendations for follow up therapy are one component of a multi-disciplinary discharge planning process, led by the attending physician.  Recommendations may be updated based on patient status, additional functional criteria and insurance authorization.  Follow Up Recommendations  Skilled nursing-short term rehab (<3  hours/day) Can patient physically be transported by private vehicle: No   Assistance Recommended at Discharge Intermittent Supervision/Assistance  Patient can return home with the following A lot of help with bathing/dressing/bathroom;Assistance with cooking/housework;Assist for transportation;Two people to help with walking and/or transfers;Help with stairs or ramp for entrance   Equipment Recommendations  None recommended by PT    Recommendations for Other Services       Precautions / Restrictions Precautions Precautions: Fall;Other (comment) Precaution Comments: L BKA (baseline) Restrictions Weight Bearing Restrictions: Yes LLE Weight Bearing: Weight bearing as tolerated Other Position/Activity Restrictions: L distal femur fx; per Dr [name] 8/10 pt can full WB     Mobility  Bed Mobility               General bed mobility comments: pt sitting on EOB on arrival    Transfers Overall transfer level: Needs assistance Equipment used: Sliding board Transfers: Bed to chair/wheelchair/BSC, Sit to/from Stand Sit to Stand: Mod assist, +2 physical assistance          Lateral/Scoot Transfers: Min assist, With slide board General transfer comment: to her left from bed to wheelchair with assist to place and remove sliding board, to anchor board and w/c and pt "shimmied" across the board with little use of UEs; stood x 2 in // bars with one person assisting from her right side and 2nd person boosting hips from behind--unable to achieve full upright standing either time due to pain in rt knee and weakness    Ambulation/Gait                   Stairs  Wheelchair Mobility    Modified Rankin (Stroke Patients Only)       Balance Overall balance assessment: Needs assistance Sitting-balance support: Feet supported Sitting balance-Leahy Scale: Good Sitting balance - Comments: no assistance with sitting balance   Standing balance support: Bilateral  upper extremity supported, During functional activity, Reliant on assistive device for balance Standing balance-Leahy Scale: Zero Standing balance comment: reliant on bars for support                            Cognition Arousal/Alertness: Awake/alert Behavior During Therapy: Anxious (tearful) Overall Cognitive Status: Impaired/Different from baseline Area of Impairment: Awareness                         Safety/Judgement: Decreased awareness of deficits, Decreased awareness of safety Awareness: Emergent   General Comments: tearful when unable to tolerate standing in parallel bars and again PT discussing need for rehab        Exercises      General Comments        Pertinent Vitals/Pain Pain Assessment Pain Assessment: Faces Faces Pain Scale: Hurts whole lot Pain Location: right knee and ankle Pain Descriptors / Indicators: Discomfort, Grimacing, Guarding Pain Intervention(s): Limited activity within patient's tolerance, Monitored during session, Premedicated before session    Home Living                          Prior Function            PT Goals (current goals can now be found in the care plan section) Acute Rehab PT Goals Patient Stated Goal: less pain with movement PT Goal Formulation: With patient Time For Goal Achievement: 12/05/21 Potential to Achieve Goals: Fair Progress towards PT goals: Progressing toward goals    Frequency    Min 2X/week      PT Plan Current plan remains appropriate    Co-evaluation PT/OT/SLP Co-Evaluation/Treatment: Yes Reason for Co-Treatment: Complexity of the patient's impairments (multi-system involvement);For patient/therapist safety;To address functional/ADL transfers          AM-PAC PT "6 Clicks" Mobility   Outcome Measure  Help needed turning from your back to your side while in a flat bed without using bedrails?: None Help needed moving from lying on your back to sitting on the  side of a flat bed without using bedrails?: None Help needed moving to and from a bed to a chair (including a wheelchair)?: A Little Help needed standing up from a chair using your arms (e.g., wheelchair or bedside chair)?: Total Help needed to walk in hospital room?: Total Help needed climbing 3-5 steps with a railing? : Total 6 Click Score: 14    End of Session Equipment Utilized During Treatment:  (pt refused gait belt) Activity Tolerance: Patient limited by pain;Other (comment) (tearful and upset) Patient left: Other (comment) (pt with OT returning to room by wheelchair)   PT Visit Diagnosis: Other abnormalities of gait and mobility (R26.89);Muscle weakness (generalized) (M62.81);History of falling (Z91.81);Pain Pain - Right/Left: Left Pain - part of body: Leg     Time: 7408-1448 PT Time Calculation (min) (ACUTE ONLY): 24 min  Charges:  $Therapeutic Activity: 8-22 mins                      Arby Barrette, PT Acute Rehabilitation Services  Office [phone]    [name] 11/28/2021, 12:34 PM

## 2021-11-28 NOTE — Progress Notes (Addendum)
[facility] Progress Note   Subjective: Seen in room, asked pt if she would do HD in chair tomorrow. Said "I'll see". HD tomorrow on schedule. No changes.  Objective Vitals:   11/27/21 1613 11/27/21 2035 11/28/21 0445 11/28/21 0843  BP: (!) 131/53 (!) 128/44 (!) 135/49 (!) 148/60  Pulse: 65 66 (!) 59 69  Resp: 18 18 18 19   Temp: 98.6 F (37 C) 98.9 F (37.2 C) 98 F (36.7 C) 98.3 F (36.8 C)  TempSrc: Oral Oral Oral Oral  SpO2: 97% 98% 96% 98%  Weight:      Height:       Physical Exam General: Chronically ill appearing female in NAD Heart: S1,S2 RRR No M/R/G Lungs: CTAB Abdomen:Obese, NABS Extremities:L BKA with stocking 1+edema in thigh, RLE 1+ edema Dialysis Access: L AVF + T/B.    Additional Objective Labs: Basic Metabolic Panel: Recent Labs  Lab 11/25/21 0749 11/25/21 1426 11/27/21 0841 11/28/21 0433  NA 129* 131* 128* 132*  K 5.5* 4.2 5.0 4.4  CL 89* 94* 89* 88*  CO2 25 28 27 29   GLUCOSE 176* 195* 151* 126*  BUN 97* 38* 78* 49*  CREATININE 6.65* 3.64* 6.00* 4.44*  CALCIUM 9.2 9.2 9.2 9.9  PHOS 7.0*  --  6.2* 4.7*   Liver Function Tests: Recent Labs  Lab 11/25/21 1426 11/27/21 0841 11/28/21 0433  AST 20  --   --   ALT 19  --   --   ALKPHOS 110  --   --   BILITOT 0.6  --   --   PROT 8.5*  --   --   ALBUMIN 3.4* 3.1* 3.2*   No results for input(s): "LIPASE", "AMYLASE" in the last 168 hours. CBC: Recent Labs  Lab 11/22/21 0759 11/25/21 0749 11/25/21 1426 11/27/21 0842 11/28/21 0433  WBC 5.9 6.7 6.3 7.3 7.4  NEUTROABS  --   --   --   --  4.1  HGB 10.9* 10.4* 12.2 10.5* 11.1*  HCT 33.8* 31.6* 35.8* 32.1* 34.4*  MCV 84.9 82.9 82.7 83.6 85.4  PLT 199 193 198 184 181   Blood Culture    Component Value Date/Time   SDES TISSUE 08/28/2021 0917   SPECREQUEST LEFT THIGH TISSUE 08/28/2021 0917   CULT  08/28/2021 0917    MODERATE ENTEROCOCCUS FAECIUM VANCOMYCIN RESISTANT ENTEROCOCCUS ISOLATED NO ANAEROBES ISOLATED Sent to  [facility] for further susceptibility testing. Performed at [facility], [place] [address], [place], [place] [address]    REPTSTATUS 09/02/2021 FINAL 08/28/2021 7824    Cardiac Enzymes: No results for input(s): "CKTOTAL", "CKMB", "CKMBINDEX", "TROPONINI" in the last 168 hours. CBG: Recent Labs  Lab 11/27/21 0746 11/27/21 1324 11/27/21 1614 11/27/21 2035 11/28/21 0714  GLUCAP 147* 169* 165* 179* 174*   Iron Studies: No results for input(s): "IRON", "TIBC", "TRANSFERRIN", "FERRITIN" in the last 72 hours. @lablastinr3 @ Studies/Results: No results found. Medications:  methocarbamol (ROBAXIN) IV      (feeding supplement) PROSource Plus  30 mL Oral BID BM   alosetron  1 mg Oral BID   amitriptyline  25 mg Oral QHS   amLODipine  10 mg Oral Q24H   atorvastatin  40 mg Oral q1800   carvedilol  6.25 mg Oral BID   cyclobenzaprine  10 mg Oral TID   docusate sodium  200 mg Oral BID   DULoxetine  60 mg Oral Daily   famotidine  20 mg Oral Daily   furosemide  80 mg Oral Daily  heparin  5,000 Units Subcutaneous Q8H   hydrALAZINE  25 mg Oral TID   insulin aspart  0-9 Units Subcutaneous TID WC   insulin aspart  4 Units Subcutaneous TID WC   insulin glargine-yfgn  7 Units Subcutaneous QHS   multivitamin  1 tablet Oral QHS   pantoprazole  80 mg Oral Daily   polyethylene glycol  17 g Oral Daily   sevelamer carbonate  2,400 mg Oral TID WC   sodium zirconium cyclosilicate  10 g Oral Once per day on Sun Tue Thu Sat     Outpatient Dialysis Orders: MWF at [facility], 400/500, EDW 90kg, 2K/2Ca, LUE AVF -heparin 4000 unit IV bolus - No ESA   Assessment/Plan: Recurrent L hip osteomyelitis: S/p 08/10/21 surgery (removal hip IMN which was placed 12/2019) with persistent VRE infection. Back to OR 08/28/21, 08/31/21 for L hip debridement and wound vac placement (now removed). Finished 6 week course of IV Daptomycin on 10/12/21. Management per primary team.  Chronic L hip/stump pain: She  does have fractures of L femoral neck (where hardware was removed) as well as distal femur Fx. Pain control per primary.  ESRD: Continue HD on usual MWF-Next HD 11/29/2021 Hyperkalemia: Recurrent issues, on 2K bath + furosemide. Lokelma 10g added on non-HD days. K+ improved overall. Change lokelma to 10 gram PRN on non HD if K+5.5 or greater. AVF dysfunction: S/p branch ligation in OR 09/19/2021 per Dr. Virl Cagey. Was having intermittent cannulation issues, now working well. HTN/volume: BP controlled on amlodipine/hydralazine/Coreg. Edema  still present  - reminded on fluid restrictions (exceeds). Getting ~4.5L UFG q HD. Anemia of ESRD: Hgb at goal, not on ESA.  Secondary HPTH: PTH/Ca. Not on VDRA. PO4 at goal, continue binders Nutrition: Alb low, continue protein supplements. DM2: Insulin per primary Dispo: SNF previously recommended, now she would like to go home on discharge even though HD unit has been clear that they are prohibited by company policy to assist patient out of her car and into the facility. Patient is aware of this-says she is going to work with transfer with PT.  Asks about home HD but they would NOT be able to train her since she is in the hospital. Dialyzed in recliner on 9/4 but signed off early due to pain, has declined to come in a recliner since then.  Talar Fraley H. Kharon Hixon NP-C 11/28/2021, 9:26 AM  [employer] [phone]

## 2021-11-28 NOTE — Progress Notes (Signed)
Occupational Therapy Treatment Patient Details Name: [name] MRN: [id number] DOB: 1965-07-20 Today's Date: 11/28/2021   History of present illness 56 y.o. female presented to ED 6/26 from dialysis with increased bloody drainage from L hip wound. 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 s/p hardware removal of left hip with partial resection of tuft tissue and femure that were nonviable on 08/10/21 Discharged home 6/23. underwent L hip debridement and wound vac placement 6/28; +Left intertrochanteric non-union,  Fracture of left distal femur  PMH: hypertension, hyperlipidemia, ESRD on HD MWF, history of left BKA in 2021, depression/anxiety, stroke, tobacco use, T2DM,  insomnia, chronic pain syndrome,   OT comments  Patient performing slide board transfer to wheelchair with PT upon entry. Patient taken to CIR gym to use parallel bars to address standing and simulate pulling up for char transfers. Patient attempted standing twice in bars with mod assist of 2 and was unable to complete to complete stand due to right knee and right ankle after second attempt. Discussed with patient options and best recommendation at this time is for rehab at SNF to provide enough rehab to increase functional transfers. Patient taken back to room and was min assist for slide board transfer back to bed.  Acute OT to continue to follow.    Recommendations for follow up therapy are one component of a multi-disciplinary discharge planning process, led by the attending physician.  Recommendations may be updated based on patient status, additional functional criteria and insurance authorization.    Follow Up Recommendations  Skilled nursing-short term rehab (<3 hours/day)    Assistance Recommended at Discharge Intermittent Supervision/Assistance  Patient can return home with the following  A lot of help with bathing/dressing/bathroom;Assistance with cooking/housework;Assist for  transportation;Help with stairs or ramp for entrance;Two people to help with walking and/or transfers   Equipment Recommendations  Other (comment) (defer to next venue)    Recommendations for Other Services      Precautions / Restrictions Precautions Precautions: Fall;Other (comment) Precaution Comments: L BKA (baseline) Restrictions Weight Bearing Restrictions: Yes LLE Weight Bearing: Weight bearing as tolerated Other Position/Activity Restrictions: L distal femur fx; per Dr [name] 8/10 pt can full WB       Mobility Bed Mobility Overal bed mobility: Needs Assistance Bed Mobility: Sit to Supine       Sit to supine: Supervision   General bed mobility comments: increased time    Transfers Overall transfer level: Needs assistance Equipment used: Sliding board Transfers: Bed to chair/wheelchair/BSC, Sit to/from Stand Sit to Stand: Mod assist, +2 physical assistance          Lateral/Scoot Transfers: Min assist, With slide board General transfer comment: attempted to stand x2 in parrallel bars with mod assist +2 and unable to come to complete stand due to RLE knee and ankle pain. Min assist to transfer back to bed with slide board     Balance Overall balance assessment: Needs assistance Sitting-balance support: Feet supported Sitting balance-Leahy Scale: Good Sitting balance - Comments: no assistance with sitting balance   Standing balance support: Bilateral upper extremity supported, During functional activity, Reliant on assistive device for balance Standing balance-Leahy Scale: Zero Standing balance comment: reliant on bars for support                           ADL either performed or assessed with clinical judgement   ADL Overall ADL's : Needs assistance/impaired  General ADL Comments: patient had performed LB dressing with nursing. Focused on attempting to stand in parralel bars and slide board  transfers    Extremity/Trunk Assessment              Vision       Perception     Praxis      Cognition Arousal/Alertness: Awake/alert Behavior During Therapy: Anxious (tearful) Overall Cognitive Status: Impaired/Different from baseline Area of Impairment: Awareness                         Safety/Judgement: Decreased awareness of deficits, Decreased awareness of safety Awareness: Emergent   General Comments: discouraged when unable to stand in parallel bars. discussed other options and patient going to rehab was considered to be the most logical        Exercises      Shoulder Instructions       General Comments      Pertinent Vitals/ Pain       Pain Assessment Pain Assessment: Faces Faces Pain Scale: Hurts whole lot Pain Location: right knee and ankle Pain Descriptors / Indicators: Discomfort, Grimacing, Guarding Pain Intervention(s): Limited activity within patient's tolerance, Monitored during session, Repositioned, Ice applied  Home Living                                          Prior Functioning/Environment              Frequency  Min 2X/week        Progress Toward Goals  OT Goals(current goals can now be found in the care plan section)  Progress towards OT goals: Progressing toward goals  Acute Rehab OT Goals Patient Stated Goal: go home OT Goal Formulation: With patient Time For Goal Achievement: 12/03/21 Potential to Achieve Goals: Fair ADL Goals Pt Will Perform Grooming: with modified independence;sitting Pt Will Perform Lower Body Bathing: with supervision;sitting/lateral leans;with adaptive equipment Pt Will Perform Lower Body Dressing: with adaptive equipment;sitting/lateral leans;with supervision Pt Will Transfer to Toilet: with min assist;with +2 assist;bedside commode;with transfer board Pt Will Perform Toileting - Clothing Manipulation and hygiene: with min assist;sitting/lateral  leans Pt/caregiver will Perform Home Exercise Program: Increased ROM;Increased strength;Both right and left upper extremity;With written HEP provided;Independently;With theraband Additional ADL Goal #1: Pt will be able to tolerate sitting up in chair for 2 hours in order to improve activity tolerance for ADLs/IADLs Additional ADL Goal #2: Pt will be able to don prosthesis with min A in prep for ADLs/mobility  Plan Discharge plan remains appropriate;Frequency remains appropriate    Co-evaluation    PT/OT/SLP Co-Evaluation/Treatment: Yes Reason for Co-Treatment: Complexity of the patient's impairments (multi-system involvement)   OT goals addressed during session: Strengthening/ROM      AM-PAC OT "6 Clicks" Daily Activity     Outcome Measure   Help from another person eating meals?: None Help from another person taking care of personal grooming?: None Help from another person toileting, which includes using toliet, bedpan, or urinal?: A Lot Help from another person bathing (including washing, rinsing, drying)?: A Lot Help from another person to put on and taking off regular upper body clothing?: A Little Help from another person to put on and taking off regular lower body clothing?: A Lot 6 Click Score: 17    End of Session Equipment Utilized During Treatment: Other (comment) (slide board)  OT  Visit Diagnosis: Muscle weakness (generalized) (M62.81);Unsteadiness on feet (R26.81);History of falling (Z91.81)   Activity Tolerance Patient tolerated treatment well;Other (comment) (upset she was unable to stand)   Patient Left in bed;with call bell/phone within reach   Nurse Communication Mobility status;Patient requests pain meds        Time: 1201-1237 OT Time Calculation (min): 36 min  Charges: OT General Charges $OT Visit: 1 Visit OT Treatments $Therapeutic Activity: 8-22 mins  [name], OTA Acute Rehabilitation Services  Office [place] 11/28/2021, 2:11 PM

## 2021-11-28 NOTE — Progress Notes (Signed)
Seen examined congratulated on progress with moving with therapy Encouraged to sit up more in bed and try to sit up during the day  She states she isnt ready to sit at HD, due to swelling and pain of LLE.  Have encouraged to try   No charge  Full note in am   Verneita Griffes, MD [facility] 4:35 PM

## 2021-11-29 LAB — CBC
HCT: 31.5 % — ABNORMAL LOW (ref 36.0–46.0)
Hemoglobin: 10.5 g/dL — ABNORMAL LOW (ref 12.0–15.0)
MCH: 28 pg (ref 26.0–34.0)
MCHC: 33.3 g/dL (ref 30.0–36.0)
MCV: 84 fL (ref 80.0–100.0)
Platelets: 175 10*3/uL (ref 150–400)
RBC: 3.75 MIL/uL — ABNORMAL LOW (ref 3.87–5.11)
RDW: 15.2 % (ref 11.5–15.5)
WBC: 7 10*3/uL (ref 4.0–10.5)
nRBC: 0 % (ref 0.0–0.2)

## 2021-11-29 LAB — GLUCOSE, CAPILLARY
Glucose-Capillary: 120 mg/dL — ABNORMAL HIGH (ref 70–99)
Glucose-Capillary: 226 mg/dL — ABNORMAL HIGH (ref 70–99)
Glucose-Capillary: 232 mg/dL — ABNORMAL HIGH (ref 70–99)

## 2021-11-29 LAB — RENAL FUNCTION PANEL
Albumin: 3.1 g/dL — ABNORMAL LOW (ref 3.5–5.0)
Anion gap: 10 (ref 5–15)
BUN: 80 mg/dL — ABNORMAL HIGH (ref 6–20)
CO2: 29 mmol/L (ref 22–32)
Calcium: 9.2 mg/dL (ref 8.9–10.3)
Chloride: 90 mmol/L — ABNORMAL LOW (ref 98–111)
Creatinine, Ser: 5.84 mg/dL — ABNORMAL HIGH (ref 0.44–1.00)
GFR, Estimated: 8 mL/min — ABNORMAL LOW (ref >60)
Glucose, Bld: 129 mg/dL — ABNORMAL HIGH (ref 70–99)
Phosphorus: 5.7 mg/dL — ABNORMAL HIGH (ref 2.5–4.6)
Potassium: 5 mmol/L (ref 3.5–5.1)
Sodium: 129 mmol/L — ABNORMAL LOW (ref 135–145)

## 2021-11-29 MED ORDER — HEPARIN SODIUM (PORCINE) 1000 UNIT/ML IJ SOLN
INTRAMUSCULAR | Status: AC
  Administered 2021-11-29: 4000 [IU]
  Filled 2021-11-29: qty 4

## 2021-11-29 NOTE — Progress Notes (Signed)
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 Orthopedic surgery/infectious disease consulted. Patient was started on empiric antibiotics and underwent multiple I&Ds by Dr. [name] on 4/12, had application of Kerecis micro powder 7/1 with local tissue rearrangement 40 x 10 cm  She was also seen by vascular surgery who did a fistula branch ligation  Patient completed a 6 week course of daptomycin 10/09/21  Discharge is pending ability for patient to tolerate HD in a chair which is complicated by patient's refusal/inability to sit in a chair secondary to pain. Patient tolerated HD in a chair on 9/4.  Assessment and Plan: ESRD on HD MWF/anasarca -Per nephrology.  TOC social worker for dialysis is talking with other kidney centers -Still on Lasix 80 daily-[for hyperkalemia]continuing calcium carbonate 200 every 2 as needed, Renvela 2400 3 times daily -Has to do HD in chair for outpatient HD,--await her readiness to sit up for HD -Cont to  encourage fluid restriction   Subacute osteomyelitis of left femur with abscess/infected left hip hardware s/p removal on 6/10 prior to admission with I&D on 6/28 and 7/1 -Completed 6 weeks of appropriate antibiotics per ID recommendation.  -CT hip on 9/2 with fluid collection and soft tissue thickening.   -Ortho rec-outpatient f/up with Dr. [name] 1 wk after discharge and follow-up - Low suspicion for persistent infection. -Pain control with Oxy IR 15 every 3 as needed may be better off switching to Dilaudid or fentanyl given dialysis.  Patient will be counseled regarding backg down/changing over in the next several days   History of left intertrochanteric fracture: previously managed with IM nail placement in 2021. Hardware removed secondary to above.   Hyponatremia/Hypokalemia/Hyperkalemia/hypomagnesemia -Management with hemodialysis-continues to have mild hyperKL and hyponatremia Lokelma TTS   ABLA superimposed on anemia of chronic disease: Stable. -Monitor intermittently   Controlled IDDM-2 with hyperglycemia and ESRD: A1c 5.9%. -Continue SSI-sensitive -Continue NovoLog 4 units 3 times daily with meals -Stable on Semglee 7 units at night -Glucose trends stable in 120 range    Primary hypertension -Continue amlodipine 10 mg, hydralazine 25 3 times daily and Coreg 6.25 twice daily   History of CVA/HLD -Continue Lipitor   Right knee pain: Likely osteoarthritis.  Patient reported worsening right knee pain to therapy and refused to participate -Xray of from 9/19 reviewed. Neg -Continue pain meds   GERD -Continue Pepcid and Protonix   Tobacco use -Tobacco cessation discussed during this admission.   Anxiety and depression: -Continue amitriptyline 25 Cymbalta DR 60 -Seems stable      Subjective: Seen on HD unit-quite upset in general-unclear why  Physical Exam: Vitals:  11/29/21 1200 11/29/21 1218 11/29/21 1221 11/29/21 1338  BP: (!) 124/57 128/86 129/63 (!) 131/57   Pulse: 66 65 67 66  Resp: (!) 9 18 (!) 21 18  Temp:   97.9 F (36.6 C) 99.1 F (37.3 C)  TempSrc:    Oral  SpO2: 98% 98% 98% 95%  Weight:   91.4 kg   Height:       Coherent awake no distress no ict pallor-flat affect Chest clear no added sound rales rhonchi S1-S2 no murmur Abdomen soft nontender bowel sounds heard no rebound no guarding Power is 5/5 grossly full exam deferred no tremors  Data Reviewed:  Na 129, K 5.0 WBC 7 Hb 10.5, Plt 175  Family Communication:  Pt in room , family not at bedside  Disposition: Status is: Inpatient Remains inpatient appropriate because: Severity of illness  Planned Discharge Destination:  Unclear at this time   Author: Nita Sells, MD 11/29/2021 3:11 PM  For on call review [email or web address].

## 2021-11-29 NOTE — Progress Notes (Signed)
  Treatment completed:   Patient tolerated well.  Pt awaiting transport back to the room  Alert, without acute distress.  Hand-off given to patient's nurse.   Access used: L AVF Access issues: None   11/29/21 1221  Vitals  Temp 97.9 F (36.6 C)  Pulse Rate 67  Resp (!) 21  BP 129/63  SpO2 98 %  O2 Device Room Air  Weight 91.4 kg  Type of Weight Post-Dialysis  Oxygen Therapy  Patient Activity (if Appropriate) In bed  Pulse Oximetry Type Continuous  Post Treatment  Dialyzer Clearance Clear  Duration of HD Treatment -hour(s) 4 hour(s)  Liters Processed 95.8  Fluid Removed 4000 mL  Tolerated HD Treatment Yes  AVG/AVF Arterial Site Held (minutes) 5 minutes  AVG/AVF Venous Site Held (minutes) 5 minutes       [name]

## 2021-11-29 NOTE — Progress Notes (Signed)
Received patient in bed to unit.  Alert and oriented.  Informed consent signed and in chart.   Treatment initiated:    11/29/21 0756  Vitals  Temp 98 F (36.7 C)  Pulse Rate (!) 58  Resp (!) 8  BP (!) 151/60  SpO2 98 %  O2 Device Room Air  Weight 95.9 kg  Type of Weight Pre-Dialysis  Oxygen Therapy  Patient Activity (if Appropriate) In bed  Pulse Oximetry Type Continuous  Pre Treatment  Is pt a NEW START this admission?  No  Vascular access used during treatment Fistula  Patient is receiving dialysis in a chair No  Hemodialysis Consent Verified Yes  Hemodialysis Standing Orders Initiated Yes  ECG (Telemetry) Monitor On Yes  Prime Ordered Normal Saline  Length of  DialysisTreatment -hour(s) 4 Hour(s)  Dialysis mode HD  Dialyzer Revaclear 400  Dialysate 2K;2.5 Ca  Dialysis Anticoagulation Heparin bolus  Dialysate Flow Ordered 300  Blood Flow Rate Ordered 400 mL/min  Ultrafiltration Goal 4000 Liters  Pre Treatment Labs CBC;Renal panel  Dialysis Blood Pressure Support Ordered Normal Saline      [facility]

## 2021-11-29 NOTE — Progress Notes (Signed)
[facility] Progress Note   Subjective:  Seen on HD - 4L UFG and tolerating, in bed (not recliner). No dyspnea or CP, + chronic L stump pain.  Objective Vitals:   11/29/21 0438 11/29/21 0756 11/29/21 0812 11/29/21 0830  BP: (!) 141/54 (!) 151/60 (!) 140/64 (!) 143/58  Pulse: (!) 58 (!) 58 (!) 58 (!) 57  Resp: 12 (!) 8 15 12   Temp: 97.8 F (36.6 C) 98 F (36.7 C)    TempSrc: Oral Oral    SpO2: 99% 98% 98% 98%  Weight:  95.9 kg    Height:       Physical Exam General: Well appearing woman, NAD. Room air Heart: RRR; no murmur Lungs: CTA anteriorly Abdomen: soft Extremities: L BKA + stump edema, RLE 1+ edema Dialysis Access: L AVF + thrill  Additional Objective Labs: Basic Metabolic Panel: Recent Labs  Lab 11/27/21 0841 11/28/21 0433 11/29/21 0815  NA 128* 132* 129*  K 5.0 4.4 5.0  CL 89* 88* 90*  CO2 27 29 29   GLUCOSE 151* 126* 129*  BUN 78* 49* 80*  CREATININE 6.00* 4.44* 5.84*  CALCIUM 9.2 9.9 9.2  PHOS 6.2* 4.7* 5.7*   Liver Function Tests: Recent Labs  Lab 11/25/21 1426 11/27/21 0841 11/28/21 0433 11/29/21 0815  AST 20  --   --   --   ALT 19  --   --   --   ALKPHOS 110  --   --   --   BILITOT 0.6  --   --   --   PROT 8.5*  --   --   --   ALBUMIN 3.4* 3.1* 3.2* 3.1*   CBC: Recent Labs  Lab 11/25/21 0749 11/25/21 1426 11/27/21 0842 11/28/21 0433 11/29/21 0814  WBC 6.7 6.3 7.3 7.4 7.0  NEUTROABS  --   --   --  4.1  --   HGB 10.4* 12.2 10.5* 11.1* 10.5*  HCT 31.6* 35.8* 32.1* 34.4* 31.5*  MCV 82.9 82.7 83.6 85.4 84.0  PLT 193 198 184 181 175   Medications:  methocarbamol (ROBAXIN) IV      (feeding supplement) PROSource Plus  30 mL Oral BID BM   alosetron  1 mg Oral BID   amitriptyline  25 mg Oral QHS   amLODipine  10 mg Oral Q24H   atorvastatin  40 mg Oral q1800   carvedilol  6.25 mg Oral BID   Chlorhexidine Gluconate Cloth  6 each Topical Q0600   cyclobenzaprine  10 mg Oral TID   docusate sodium  200 mg Oral BID    DULoxetine  60 mg Oral Daily   famotidine  20 mg Oral Daily   furosemide  80 mg Oral Daily   heparin  5,000 Units Subcutaneous Q8H   hydrALAZINE  25 mg Oral TID   insulin aspart  0-9 Units Subcutaneous TID WC   insulin aspart  4 Units Subcutaneous TID WC   insulin glargine-yfgn  7 Units Subcutaneous QHS   multivitamin  1 tablet Oral QHS   pantoprazole  80 mg Oral Daily   polyethylene glycol  17 g Oral Daily   sevelamer carbonate  2,400 mg Oral TID WC   [START ON 11/30/2021] sodium zirconium cyclosilicate  10 g Oral Once per day on Sun Tue Thu Sat    Dialysis Orders: MWF at [facility], 400/500, EDW 90kg, 2K/2Ca, LUE AVF -heparin 4000 unit IV bolus - No ESA   Assessment/Plan: Recurrent L hip osteomyelitis: S/p  08/10/21 surgery (removal hip IMN which was placed  12/2019) with persistent VRE infection. Back to OR 08/28/21, 08/31/21 for L hip debridement and wound vac placement (now removed). Finished 6 week course of IV Daptomycin on 10/12/21. Management per primary team.  Chronic L hip/stump pain: She does have fractures of L femoral neck (where hardware was removed) as well as distal femur Fx. Pain control per primary.  ESRD: Continue HD on usual MWF - HD now, 4L UFG, 2K bath. Hyperkalemia: Recurrent issues, on 2K bath + furosemide. Lokelma 10g added on non-HD days -> now prn. AVF dysfunction: S/p branch ligation in OR 09/19/2021 per Dr. Virl Cagey. AVF working well now. HTN/volume: BP controlled on amlodipine/hydralazine/Coreg. Edema  still present  - reminded on fluid restrictions (exceeds). Getting ~ 4L off q HD. Anemia of ESRD: Hgb at goal, not on ESA.  Secondary HPTH: CorrCa slightly high, not on VDRA. Phos/PTH good. Continue Renvela as binder. Nutrition: Alb low, continue protein supplements. DM2: Insulin per primary Dispo: SNF previously recommended, now she would like to go home on discharge even though HD unit has been clear that they are prohibited by company policy to assist  patient out of her car and into the facility. Patient is aware of this - says she is going to work with transfer with PT.  Asks about home HD but they would NOT be able to train her since she is in the hospital. Dialyzed in recliner on 9/4 but signed off early due to pain, has declined to come in a recliner since then.  Veneta Penton, PA-C 11/29/2021, 8:50 AM  [employer]

## 2021-11-30 LAB — GLUCOSE, CAPILLARY
Glucose-Capillary: 148 mg/dL — ABNORMAL HIGH (ref 70–99)
Glucose-Capillary: 195 mg/dL — ABNORMAL HIGH (ref 70–99)
Glucose-Capillary: 132 mg/dL — ABNORMAL HIGH (ref 70–99)
Glucose-Capillary: 183 mg/dL — ABNORMAL HIGH (ref 70–99)

## 2021-11-30 NOTE — Plan of Care (Signed)
  Problem: Nutritional: Goal: Ability to make healthy dietary choices will improve Outcome: Progressing   Problem: Activity: Goal: Risk for activity intolerance will decrease Outcome: Not Progressing   Problem: Pain Managment: Goal: General experience of comfort will improve Outcome: Not Progressing

## 2021-11-30 NOTE — Progress Notes (Signed)
No real changes today For Hd on Monday Sitting up a little more Will work with therapy Monday No distress  Have encouraged to sit up as much as possible outside HD--seems to have discomfort with moving but is trying to mobilize some  No charge  Verneita Griffes, MD [facility] 3:51 PM

## 2021-11-30 NOTE — Plan of Care (Signed)
  Problem: Activity: Goal: Risk for activity intolerance will decrease Outcome: Progressing   Problem: Pain Managment: Goal: General experience of comfort will improve Outcome: Progressing   

## 2021-11-30 NOTE — Progress Notes (Signed)
[name] Progress Note   Subjective:  Seen in room - did well with HD yesterday, 4L net UF. No CP/dyspnea.  Objective Vitals:   11/29/21 1642 11/29/21 2135 11/30/21 0510 11/30/21 0952  BP: (!) 119/54 (!) 108/50 (!) 142/56 (!) 129/55  Pulse: 63 60 (!) 58 60  Resp: 17 17 17 17   Temp: 98.7 F (37.1 C) 98.2 F (36.8 C) 98.2 F (36.8 C) 98.3 F (36.8 C)  TempSrc: Oral   Oral  SpO2: 96% 98% 100% 99%  Weight:      Height:       Physical Exam General: Well appearing woman, NAD. Room air Heart: RRR; no murmur Lungs: CTA anteriorly Abdomen: soft Extremities: L BKA + stump edema, RLE 1+ edema Dialysis Access: L AVF + thrill  Additional Objective Labs: Basic Metabolic Panel: Recent Labs  Lab 11/27/21 0841 11/28/21 0433 11/29/21 0815  NA 128* 132* 129*  K 5.0 4.4 5.0  CL 89* 88* 90*  CO2 27 29 29   GLUCOSE 151* 126* 129*  BUN 78* 49* 80*  CREATININE 6.00* 4.44* 5.84*  CALCIUM 9.2 9.9 9.2  PHOS 6.2* 4.7* 5.7*   Liver Function Tests: Recent Labs  Lab 11/25/21 1426 11/27/21 0841 11/28/21 0433 11/29/21 0815  AST 20  --   --   --   ALT 19  --   --   --   ALKPHOS 110  --   --   --   BILITOT 0.6  --   --   --   PROT 8.5*  --   --   --   ALBUMIN 3.4* 3.1* 3.2* 3.1*   CBC: Recent Labs  Lab 11/25/21 0749 11/25/21 1426 11/27/21 0842 11/28/21 0433 11/29/21 0814  WBC 6.7 6.3 7.3 7.4 7.0  NEUTROABS  --   --   --  4.1  --   HGB 10.4* 12.2 10.5* 11.1* 10.5*  HCT 31.6* 35.8* 32.1* 34.4* 31.5*  MCV 82.9 82.7 83.6 85.4 84.0  PLT 193 198 184 181 175   Medications:  methocarbamol (ROBAXIN) IV      (feeding supplement) PROSource Plus  30 mL Oral BID BM   alosetron  1 mg Oral BID   amitriptyline  25 mg Oral QHS   amLODipine  10 mg Oral Q24H   atorvastatin  40 mg Oral q1800   carvedilol  6.25 mg Oral BID   Chlorhexidine Gluconate Cloth  6 each Topical Q0600   cyclobenzaprine  10 mg Oral TID   docusate sodium  200 mg Oral BID   DULoxetine  60 mg  Oral Daily   famotidine  20 mg Oral Daily   furosemide  80 mg Oral Daily   heparin  5,000 Units Subcutaneous Q8H   hydrALAZINE  25 mg Oral TID   insulin aspart  0-9 Units Subcutaneous TID WC   insulin aspart  4 Units Subcutaneous TID WC   insulin glargine-yfgn  7 Units Subcutaneous QHS   multivitamin  1 tablet Oral QHS   pantoprazole  80 mg Oral Daily   polyethylene glycol  17 g Oral Daily   sevelamer carbonate  2,400 mg Oral TID WC   sodium zirconium cyclosilicate  10 g Oral Once per day on Sun Tue Thu Sat    Dialysis Orders: MWF at [place], 400/500, EDW 90kg, 2K/2Ca, LUE AVF -heparin 4000 unit IV bolus - No ESA   Assessment/Plan: Recurrent L hip osteomyelitis: S/p 08/10/21 surgery (removal hip IMN which was placed  12/2019) with persistent VRE infection. Back to OR 08/28/21, 08/31/21 for L hip debridement and wound vac placement (now removed). Finished 6 week course of IV Daptomycin on 10/12/21. Management per primary team.  Chronic L hip/stump pain: She does have fractures of L femoral neck (where hardware was removed) as well as distal femur Fx. Pain control per primary.  ESRD: Continue HD on usual MWF - next HD 10/2. Hyperkalemia: Recurrent issues, on 2K bath + furosemide. Lokelma 10g added on non-HD days -> now prn. AVF dysfunction: S/p branch ligation in OR 09/19/2021 per Dr. Virl Cagey. AVF working well now. HTN/volume: BP controlled on amlodipine/hydralazine/Coreg. Edema  still present  - reminded on fluid restrictions (exceeds). Getting ~ 4L off q HD. Anemia of ESRD: Hgb at goal, not on ESA.  Secondary HPTH: CorrCa slightly high, not on VDRA. Phos up slightly. Continue Renvela as binder. Nutrition: Alb low, continue protein supplements. DM2: Insulin per primary Dispo: SNF previously recommended, now she would like to go home on discharge even though HD unit has been clear that they are prohibited by company policy to assist patient out of her car and into the facility.  Patient is aware of this - says she is going to work with transfer with PT.  Asks about home HD but they would NOT be able to train her since she is in the hospital. Dialyzed in recliner on 9/4 but signed off early due to pain, has declined to come in a recliner since then.  Donna Penton, PA-C 11/30/2021, 10:18 AM  [employer]

## 2021-12-01 DIAGNOSIS — M86252 Subacute osteomyelitis, left femur: Secondary | ICD-10-CM | POA: Diagnosis not present

## 2021-12-01 LAB — GLUCOSE, CAPILLARY
Glucose-Capillary: 147 mg/dL — ABNORMAL HIGH (ref 70–99)
Glucose-Capillary: 135 mg/dL — ABNORMAL HIGH (ref 70–99)
Glucose-Capillary: 144 mg/dL — ABNORMAL HIGH (ref 70–99)
Glucose-Capillary: 172 mg/dL — ABNORMAL HIGH (ref 70–99)

## 2021-12-01 NOTE — Progress Notes (Signed)
[facility] Progress Note   Subjective:  Seen in room - no new issues, no CP/dyspnea.  Objective Vitals:   11/30/21 1644 11/30/21 2113 12/01/21 0552 12/01/21 0849  BP: (!) 131/52 (!) 125/53 (!) 128/46 (!) 140/56  Pulse: (!) 58 (!) 57 (!) 57 (!) 58  Resp: 18 18 18 18   Temp: 98.4 F (36.9 C) 98.3 F (36.8 C) 98 F (36.7 C) 98.7 F (37.1 C)  TempSrc: Oral Oral Oral Oral  SpO2: 97% 95% 99% 98%  Weight:      Height:       Physical Exam General: Well appearing woman, NAD. Room air Heart: RRR; no murmur Lungs: CTA anteriorly Abdomen: soft Extremities: L BKA + stump edema, RLE 1+ edema Dialysis Access: L AVF + thrill  Additional Objective Labs: Basic Metabolic Panel: Recent Labs  Lab 11/27/21 0841 11/28/21 0433 11/29/21 0815  NA 128* 132* 129*  K 5.0 4.4 5.0  CL 89* 88* 90*  CO2 27 29 29   GLUCOSE 151* 126* 129*  BUN 78* 49* 80*  CREATININE 6.00* 4.44* 5.84*  CALCIUM 9.2 9.9 9.2  PHOS 6.2* 4.7* 5.7*   Liver Function Tests: Recent Labs  Lab 11/25/21 1426 11/27/21 0841 11/28/21 0433 11/29/21 0815  AST 20  --   --   --   ALT 19  --   --   --   ALKPHOS 110  --   --   --   BILITOT 0.6  --   --   --   PROT 8.5*  --   --   --   ALBUMIN 3.4* 3.1* 3.2* 3.1*   CBC: Recent Labs  Lab 11/25/21 0749 11/25/21 1426 11/27/21 0842 11/28/21 0433 11/29/21 0814  WBC 6.7 6.3 7.3 7.4 7.0  NEUTROABS  --   --   --  4.1  --   HGB 10.4* 12.2 10.5* 11.1* 10.5*  HCT 31.6* 35.8* 32.1* 34.4* 31.5*  MCV 82.9 82.7 83.6 85.4 84.0  PLT 193 198 184 181 175   Medications:  methocarbamol (ROBAXIN) IV      (feeding supplement) PROSource Plus  30 mL Oral BID BM   alosetron  1 mg Oral BID   amitriptyline  25 mg Oral QHS   amLODipine  10 mg Oral Q24H   atorvastatin  40 mg Oral q1800   carvedilol  6.25 mg Oral BID   cyclobenzaprine  10 mg Oral TID   docusate sodium  200 mg Oral BID   DULoxetine  60 mg Oral Daily   famotidine  20 mg Oral Daily   furosemide  80 mg  Oral Daily   heparin  5,000 Units Subcutaneous Q8H   hydrALAZINE  25 mg Oral TID   insulin aspart  0-9 Units Subcutaneous TID WC   insulin aspart  4 Units Subcutaneous TID WC   insulin glargine-yfgn  7 Units Subcutaneous QHS   multivitamin  1 tablet Oral QHS   pantoprazole  80 mg Oral Daily   polyethylene glycol  17 g Oral Daily   sevelamer carbonate  2,400 mg Oral TID WC   sodium zirconium cyclosilicate  10 g Oral Once per day on Sun Tue Thu Sat    Dialysis Orders: MWF at [facility], 400/500, EDW 90kg, 2K/2Ca, LUE AVF - heparin 4000 unit IV bolus - No ESA   Assessment/Plan: Recurrent L hip osteomyelitis: S/p 08/10/21 surgery (removal hip IMN which was placed  12/2019) with persistent VRE infection. Back to OR 08/28/21, 08/31/21 for  L hip debridement and wound vac placement (now removed). Finished 6 week course of IV Daptomycin on 10/12/21. Management per primary team.  Chronic L hip/stump pain: She does have fractures of L femoral neck (where hardware was removed) as well as distal femur Fx. Pain control per primary.  ESRD: Continue HD on usual MWF - next HD 10/2. Hyperkalemia: Recurrent issues, on 2K bath + furosemide. Lokelma 10g added on non-HD days -> now prn. AVF dysfunction: S/p branch ligation in OR 09/19/2021 per Dr. Virl Cagey. AVF working well now. HTN/volume: BP controlled on amlodipine/hydralazine/Coreg. Edema  still present  - reminded on fluid restrictions (exceeds). Getting ~ 4L off q HD. Anemia of ESRD: Hgb at goal, not on ESA.  Secondary HPTH: CorrCa slightly high, not on VDRA. Phos up slightly. Continue Renvela as binder. Nutrition: Alb low, continue protein supplements. DM2: Insulin per primary Dispo: SNF previously recommended, now she would like to go home on discharge even though HD unit has been clear that they are prohibited by company policy to assist patient out of her car and into the facility. Patient is aware of this - says she is going to work with transfer  with PT.  Asks about home HD but they would NOT be able to train her since she is in the hospital. Dialyzed in recliner on 9/4 but signed off early due to pain, has declined to come in a recliner since then.    Veneta Penton, PA-C 12/01/2021, 9:46 AM  [employer]

## 2021-12-01 NOTE — Progress Notes (Signed)
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 Orthopedic surgery/infectious disease consulted. Patient was started on empiric antibiotics and underwent multiple I&Ds by Dr. [name] on 4/62, had application of Kerecis micro powder 7/1 with local tissue rearrangement 40 x 10 cm  She was also seen by vascular surgery who did a fistula branch ligation  Patient completed a 6 week course of daptomycin 10/09/21  Discharge is pending ability for patient to tolerate HD in a chair which is complicated by patient's refusal/inability to sit in a chair secondary to pain. Patient tolerated HD in a chair on 9/4.  Assessment and Plan: ESRD on HD MWF/anasarca -Per nephrology.  TOC Education officer, museum for dialysis is talking with other kidney centers -Still on Lasix 80 daily-[for hyperkalemia]continuing calcium carbonate 200 every 2 as needed, Renvela 2400 3 times daily -Has to do HD in chair for outpatient HD,--have encouraged her to re-consider going to SNF.   Explained do not think she will reliably heal nor get stronger without frequent attempts at therapy--CSW to follow -Cont to encourage fluid restriction   Subacute osteomyelitis of left femur with abscess/infected left hip hardware s/p removal on 6/10 prior to admission with I&D on 6/28 and 7/1 -Completed 6 weeks of appropriate antibiotics per ID recommendation.  -CT hip on 9/2 with fluid collection and soft tissue thickening.   -Ortho rec-outpatient f/up with Dr. [name] 1 wk after discharge and follow-up--will ask Dr. [name] to clarify Wght bearing and mobility - Low suspicion for persistent infection. -Pain control with Oxy IR 15 every 3 as needed may be better off switching to Dilaudid or fentanyl given dialysis.  Patient will be counseled regarding backg down/changing over in the next several days   History of left intertrochanteric fracture: previously managed with IM nail placement in 2021. Hardware removed secondary to above.   Hyponatremia/Hypokalemia/Hyperkalemia/hypomagnesemia -Management with hemodialysis-continues to have mild hyperKL and hyponatremia Lokelma TTS   ABLA superimposed on anemia of chronic disease: Stable. -Monitor intermittently   Controlled IDDM-2 with hyperglycemia and ESRD: A1c 5.9%. -Continue SSI-sensitive -Continue NovoLog 4 units 3 times daily with meals -Stable on Semglee 7 units at night -Glucose trends stable in 130-190    Primary hypertension -Continue amlodipine 10 mg, hydralazine 25 3 times daily and Coreg 6.25 twice daily   History of CVA/HLD -Continue Lipitor   Right knee pain: Likely osteoarthritis.  Patient reported worsening right knee pain to therapy and refused to participate -Xray of from 9/19 reviewed. Neg -Continue pain meds   GERD -Continue Pepcid and Protonix   Tobacco use -Tobacco cessation discussed during this admission.   Anxiety and depression: -Continue  amitriptyline 25 Cymbalta DR 60 -Seems stable      Subjective:    Overall awake coherent in nad no focal deficit Sitting half-way up in the bed No fever no chills  Physical Exam: Vitals:   11/30/21 1644 11/30/21 2113 12/01/21 0552 12/01/21 0849  BP: (!) 131/52 (!) 125/53 (!) 128/46 (!) 140/56  Pulse: (!) 58 (!) 57 (!) 57 (!) 58  Resp: '18 18 18 18  ' Temp: 98.4 F (36.9 C) 98.3 F (36.8 C) 98 F (36.7 C) 98.7 F (37.1 C)  TempSrc: Oral Oral Oral Oral  SpO2: 97% 95% 99% 98%  Weight:      Height:       awake no distress no ict pallor-flat affect Chest clear no added sound rales rhonchi S1-S2 no murmur Abdomen soft nontender bowel sounds heard no rebound no guarding Power is 5/5 grossly full exam deferred no tremors  Data Reviewed:  No recent labs  Family Communication:  Pt in room , met with daughter at the bedside and discussed with her poc  Disposition: Status is: Inpatient Remains inpatient appropriate because: Severity of illness  Planned Discharge Destination:  Unclear at this time    Author: Nita Sells, MD 12/01/2021 1:33 PM  For on call review [email or web address].

## 2021-12-02 ENCOUNTER — Inpatient Hospital Stay (HOSPITAL_COMMUNITY): Payer: Medicare Other

## 2021-12-02 DIAGNOSIS — M86252 Subacute osteomyelitis, left femur: Secondary | ICD-10-CM | POA: Diagnosis not present

## 2021-12-02 LAB — CBC WITH DIFFERENTIAL/PLATELET
Abs Immature Granulocytes: 0.02 10*3/uL (ref 0.00–0.07)
Basophils Absolute: 0.1 10*3/uL (ref 0.0–0.1)
Basophils Relative: 1 %
Eosinophils Absolute: 0.2 10*3/uL (ref 0.0–0.5)
Eosinophils Relative: 3 %
HCT: 31.7 % — ABNORMAL LOW (ref 36.0–46.0)
Hemoglobin: 10.6 g/dL — ABNORMAL LOW (ref 12.0–15.0)
Immature Granulocytes: 0 %
Lymphocytes Relative: 33 %
Lymphs Abs: 2.2 10*3/uL (ref 0.7–4.0)
MCH: 27.8 pg (ref 26.0–34.0)
MCHC: 33.4 g/dL (ref 30.0–36.0)
MCV: 83.2 fL (ref 80.0–100.0)
Monocytes Absolute: 1 10*3/uL (ref 0.1–1.0)
Monocytes Relative: 15 %
Neutro Abs: 3.2 10*3/uL (ref 1.7–7.7)
Neutrophils Relative %: 48 %
Platelets: 183 10*3/uL (ref 150–400)
RBC: 3.81 MIL/uL — ABNORMAL LOW (ref 3.87–5.11)
RDW: 15.4 % (ref 11.5–15.5)
WBC: 6.7 10*3/uL (ref 4.0–10.5)
nRBC: 0 % (ref 0.0–0.2)

## 2021-12-02 LAB — COMPREHENSIVE METABOLIC PANEL
ALT: 15 U/L (ref 0–44)
AST: 16 U/L (ref 15–41)
Albumin: 3.3 g/dL — ABNORMAL LOW (ref 3.5–5.0)
Alkaline Phosphatase: 105 U/L (ref 38–126)
Anion gap: 15 (ref 5–15)
BUN: 95 mg/dL — ABNORMAL HIGH (ref 6–20)
CO2: 26 mmol/L (ref 22–32)
Calcium: 8.9 mg/dL (ref 8.9–10.3)
Chloride: 87 mmol/L — ABNORMAL LOW (ref 98–111)
Creatinine, Ser: 6.71 mg/dL — ABNORMAL HIGH (ref 0.44–1.00)
GFR, Estimated: 7 mL/min — ABNORMAL LOW (ref >60)
Glucose, Bld: 164 mg/dL — ABNORMAL HIGH (ref 70–99)
Potassium: 4.9 mmol/L (ref 3.5–5.1)
Sodium: 128 mmol/L — ABNORMAL LOW (ref 135–145)
Total Bilirubin: 0.6 mg/dL (ref 0.3–1.2)
Total Protein: 7.6 g/dL (ref 6.5–8.1)

## 2021-12-02 LAB — GLUCOSE, CAPILLARY
Glucose-Capillary: 138 mg/dL — ABNORMAL HIGH (ref 70–99)
Glucose-Capillary: 144 mg/dL — ABNORMAL HIGH (ref 70–99)
Glucose-Capillary: 123 mg/dL — ABNORMAL HIGH (ref 70–99)
Glucose-Capillary: 174 mg/dL — ABNORMAL HIGH (ref 70–99)

## 2021-12-02 MED ORDER — ONDANSETRON HCL 4 MG PO TABS
4.0000 mg | ORAL_TABLET | Freq: Once | ORAL | Status: AC
  Administered 2021-12-02: 4 mg via ORAL
  Filled 2021-12-02: qty 1

## 2021-12-02 MED ORDER — ONDANSETRON HCL 4 MG/2ML IJ SOLN: 4.0000 mg | Freq: Once | INTRAMUSCULAR | Status: AC

## 2021-12-02 NOTE — Progress Notes (Addendum)
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 Orthopedic surgery/infectious disease consulted. Patient was started on empiric antibiotics and underwent multiple I&Ds by Dr. [name] on 0/25, had application of Kerecis micro powder 7/1 with local tissue rearrangement 40 x 10 cm  She was also seen by vascular surgery who did a fistula branch ligation  Patient completed a 6 week course of daptomycin 10/09/21  Discharge is pending ability for patient to tolerate HD in a chair which is complicated by patient's refusal/inability to sit in a chair secondary to pain. Patient tolerated HD in a chair on 9/4.  Assessment and Plan: ESRD on HD MWF/anasarca -Per nephrology.  TOC Education officer, museum for dialysis is talking with other kidney centers -Still on Lasix 80 daily-[for hyperkalemia]continuing calcium carbonate 200 every 2 as needed, Renvela 2400 3 times daily -Has to do HD in chair for outpatient HD,--she might re-consider SNF-CSW to follow after PT  re-evals -Cont to encourage fluid restriction   Subacute osteomyelitis of left femur with abscess/infected left hip hardware s/p removal on 6/10 prior to admission with I&D on 6/28 and 7/1 -Completed 6 weeks of appropriate antibiotics per ID recommendation.  -CT hip on 9/2 with fluid collection and soft tissue thickening.   -Ortho rec-outpatient f/up with Dr. [name] 1 wk after discharge and follow-up--might re-image L knee to see if any acute changes and help her mobilize on it with reassurance that all is improved [if indeed improved] -Low suspicion for persistent infection. -Pain control with Oxy IR 15 every 3 as needed may be better off switching to Dilaudid or fentanyl given dialysis. De-escaalte meds as able   History of left intertrochanteric fracture: previously managed with IM nail placement in 2021. Hardware removed secondary to above.   Hyponatremia/Hypokalemia/Hyperkalemia/hypomagnesemia -Management with hemodialysis-continues to have mild hyperKL and hyponatremia Lokelma TTSSu   ABLA superimposed on anemia of chronic disease: Stable. -Monitor intermittently   Controlled IDDM-2 with hyperglycemia and ESRD: A1c 5.9%. -Continue SSI-sensitive -Continue NovoLog 4 units 3 times daily with meals -Stable on Semglee 7 units at night -Glucose trends stable in 144-164  overall astable    Primary hypertension -Continue amlodipine 10 mg, hydralazine 25 x 3 times daily and Coreg 6.25 twice daily   History of CVA/HLD -Continue Lipitor   Right knee pain: Likely osteoarthritis.  Patient reported worsening right knee pain --encouraged mobility -Xray of from 9/19 reviewed. Neg -Continue pain meds   GERD -Continue Pepcid and Protonix   Tobacco use -Tobacco cessation discussed during this admission.   Anxiety and depression: -Continue amitriptyline 25 Cymbalta DR 60 -Seems stable      Subjective:  Some n today, didn't go to HD--otherwise seems about the same Discussed with  therapy for patient to mobilize more if we are able at next session--appreciate cares  Physical Exam: Vitals:   12/01/21 1645 12/01/21 1647 12/01/21 2111 12/02/21 0559  BP: (!) 71/53 (!) 133/57 (!) 135/59 (!) 151/54  Pulse: (!) 55 (!) 55 61 (!) 59  Resp: 17 16 18 18   Temp: (!) 97.5 F (36.4 C)  99 F (37.2 C) 97.9 F (36.6 C)  TempSrc: Oral  Oral Oral  SpO2: 100% 99% 98% 100%  Weight:      Height:       awake no distress no ict pallor Chest clear no added sound rales rhonchi S1-S2 no murmur Abdomen soft nontender bowel sounds heard no rebound no guarding Power is 5/5 grossly full exam deferred no tremors  Data Reviewed:  No recent labs  Family Communication: patient alone-no family today  Disposition: Status is: Inpatient Remains inpatient appropriate because: Severity of illness  Planned Discharge Destination:  Unclear at this time--no safe d/c plan    Author: Nita Sells, MD 12/02/2021 3:58 PM  For on call review [email or web address].

## 2021-12-02 NOTE — Progress Notes (Signed)
Patient declined to go to HD 1st shift this am as this RN was coming onto the shift.  Dialysis called back to get her for a later time, patient stated that she did not feel well and did not want to go to HD when transport arrived.  This RN educated patient about missing dialysis, patient verbalized understanding.  This RN called dialysis back with confirmation of patient declining HD.   Aurther Loft, RN

## 2021-12-02 NOTE — Plan of Care (Signed)
  Problem: Pain Managment: Goal: General experience of comfort will improve Outcome: Progressing   

## 2021-12-02 NOTE — Progress Notes (Signed)
I called for report for patient to come to dialysis around 6:45am. Nurse stated that patient would like to come 2nd shift.   Transport was called to pick up patient for 11:15 (2nd shift spot) and patient declined to come for the day.

## 2021-12-02 NOTE — Progress Notes (Addendum)
[facility] Progress Note   Subjective:    Seen and examined patient on bedside. She reports not feeling well today causing to postpone today's treatment. She c/o indigestion and nausea. Noted Zofran PO X 1, Protonix, and TUMS given. Hopeful we can dialyze her later this afternoon. Plan to re-assess current symptoms. If she continues to refuse, will place her on TTS schedule this week.  Objective Vitals:   12/01/21 1645 12/01/21 1647 12/01/21 2111 12/02/21 0559  BP: (!) 71/53 (!) 133/57 (!) 135/59 (!) 151/54  Pulse: (!) 55 (!) 55 61 (!) 59  Resp: 17 16 18 18   Temp: (!) 97.5 F (36.4 C)  99 F (37.2 C) 97.9 F (36.6 C)  TempSrc: Oral  Oral Oral  SpO2: 100% 99% 98% 100%  Weight:      Height:       Physical Exam General: Well appearing woman, NAD. Room air Heart: RRR; no murmur Lungs: CTA anteriorly Abdomen: soft Extremities: L BKA + stump edema, RLE 1+ edema Dialysis Access: L AVF + thrill  Filed Weights   11/27/21 1250 11/29/21 0756 11/29/21 1221  Weight: 91.6 kg 95.9 kg 91.4 kg    Intake/Output Summary (Last 24 hours) at 12/02/2021 1319 Last data filed at 12/02/2021 0600 Gross per 24 hour  Intake 600 ml  Output 300 ml  Net 300 ml    Additional Objective Labs: Basic Metabolic Panel: Recent Labs  Lab 11/27/21 0841 11/28/21 0433 11/29/21 0815 12/02/21 0511  NA 128* 132* 129* 128*  K 5.0 4.4 5.0 4.9  CL 89* 88* 90* 87*  CO2 27 29 29 26   GLUCOSE 151* 126* 129* 164*  BUN 78* 49* 80* 95*  CREATININE 6.00* 4.44* 5.84* 6.71*  CALCIUM 9.2 9.9 9.2 8.9  PHOS 6.2* 4.7* 5.7*  --    Liver Function Tests: Recent Labs  Lab 11/25/21 1426 11/27/21 0841 11/28/21 0433 11/29/21 0815 12/02/21 0511  AST 20  --   --   --  16  ALT 19  --   --   --  15  ALKPHOS 110  --   --   --  105  BILITOT 0.6  --   --   --  0.6  PROT 8.5*  --   --   --  7.6  ALBUMIN 3.4*   < > 3.2* 3.1* 3.3*   < > = values in this interval not displayed.   No results for input(s):  "LIPASE", "AMYLASE" in the last 168 hours. CBC: Recent Labs  Lab 11/25/21 1426 11/27/21 0842 11/28/21 0433 11/29/21 0814 12/02/21 0511  WBC 6.3 7.3 7.4 7.0 6.7  NEUTROABS  --   --  4.1  --  3.2  HGB 12.2 10.5* 11.1* 10.5* 10.6*  HCT 35.8* 32.1* 34.4* 31.5* 31.7*  MCV 82.7 83.6 85.4 84.0 83.2  PLT 198 184 181 175 183   Blood Culture    Component Value Date/Time   SDES TISSUE 08/28/2021 [place] TISSUE 08/28/2021 0917   CULT  08/28/2021 0917    MODERATE ENTEROCOCCUS FAECIUM VANCOMYCIN RESISTANT ENTEROCOCCUS ISOLATED NO ANAEROBES ISOLATED Sent to [place] for further susceptibility testing. Performed at [facility], [place] [address] [place], [place] [address]    REPTSTATUS 09/02/2021 FINAL 08/28/2021 4818    Cardiac Enzymes: No results for input(s): "CKTOTAL", "CKMB", "CKMBINDEX", "TROPONINI" in the last 168 hours. CBG: Recent Labs  Lab 12/01/21 1133 12/01/21 1643 12/01/21 2111 12/02/21 0747 12/02/21 1159  GLUCAP 135* 144*  172* 138* 144*   Iron Studies: No results for input(s): "IRON", "TIBC", "TRANSFERRIN", "FERRITIN" in the last 72 hours. Lab Results  Component Value Date   INR 1.1 12/28/2019   INR 1.2 12/17/2018   INR 0.99 04/07/2017   Studies/Results: No results found.  Medications:  methocarbamol (ROBAXIN) IV      (feeding supplement) PROSource Plus  30 mL Oral BID BM   alosetron  1 mg Oral BID   amitriptyline  25 mg Oral QHS   amLODipine  10 mg Oral Q24H   atorvastatin  40 mg Oral q1800   carvedilol  6.25 mg Oral BID   cyclobenzaprine  10 mg Oral TID   docusate sodium  200 mg Oral BID   DULoxetine  60 mg Oral Daily   famotidine  20 mg Oral Daily   furosemide  80 mg Oral Daily   heparin  5,000 Units Subcutaneous Q8H   hydrALAZINE  25 mg Oral TID   insulin aspart  0-9 Units Subcutaneous TID WC   insulin aspart  4 Units Subcutaneous TID WC   insulin glargine-yfgn  7 Units Subcutaneous QHS   multivitamin  1 tablet Oral  QHS   pantoprazole  80 mg Oral Daily   polyethylene glycol  17 g Oral Daily   sevelamer carbonate  2,400 mg Oral TID WC   sodium zirconium cyclosilicate  10 g Oral Once per day on Sun Tue Thu Sat    Dialysis Orders: MWF at [facility], 400/500, EDW 90kg, 2K/2Ca, LUE AVF - heparin 4000 unit IV bolus - No ESA  Assessment/Plan: Recurrent L hip osteomyelitis: S/p 08/10/21 surgery (removal hip IMN which was placed  12/2019) with persistent VRE infection. Back to OR 08/28/21, 08/31/21 for L hip debridement and wound vac placement (now removed). Finished 6 week course of IV Daptomycin on 10/12/21. Management per primary team.  Chronic L hip/stump pain: She does have fractures of L femoral neck (where hardware was removed) as well as distal femur Fx. Pain control per primary.  ESRD: Continue HD on usual MWF - next HD 10/2. Hyperkalemia: Recurrent issues, on 2K bath + furosemide. Lokelma 10g added on non-HD days -> now prn. K+ today 4.9. AVF dysfunction: S/p branch ligation in OR 09/19/2021 per Dr. Virl Cagey. AVF working well now. HTN/volume: BP controlled on amlodipine/hydralazine/Coreg. Edema  still present  - reminded on fluid restrictions (exceeds). Getting ~ 4L off q HD. Anemia of ESRD: Hgb at goal, not on ESA.  Secondary HPTH: CorrCa slightly high, not on VDRA. Phos up slightly. Continue Renvela as binder. Nutrition: Alb low, continue protein supplements. DM2: Insulin per primary Dispo: SNF previously recommended, now she would like to go home on discharge even though HD unit has been clear that they are prohibited by company policy to assist patient out of her car and into the facility. Patient is aware of this - says she is going to work with transfer with PT.  Asks about home HD but they would NOT be able to train her since she is in the hospital. Dialyzed in recliner on 9/4 but signed off early due to pain, has declined to come in a recliner since then.  ADDENDUM: Patient refused HD today  d/t not feeling well despite medications given today to help alleviate her discomfort. Discussed with patient at bedside. Plan for HD 10/3-she will be on TTS schedule this week. Prince Rome, NP  Tobie Poet, NP [facility] 12/02/2021,1:19 PM  LOS: 98 days

## 2021-12-02 NOTE — Plan of Care (Signed)
  Problem: Activity: Goal: Risk for activity intolerance will decrease Outcome: Progressing   Problem: Pain Managment: Goal: General experience of comfort will improve Outcome: Progressing   

## 2021-12-03 DIAGNOSIS — M86252 Subacute osteomyelitis, left femur: Secondary | ICD-10-CM | POA: Diagnosis not present

## 2021-12-03 LAB — CBC
HCT: 30.2 % — ABNORMAL LOW (ref 36.0–46.0)
Hemoglobin: 9.9 g/dL — ABNORMAL LOW (ref 12.0–15.0)
MCH: 27.5 pg (ref 26.0–34.0)
MCHC: 32.8 g/dL (ref 30.0–36.0)
MCV: 83.9 fL (ref 80.0–100.0)
Platelets: 191 10*3/uL (ref 150–400)
RBC: 3.6 MIL/uL — ABNORMAL LOW (ref 3.87–5.11)
RDW: 15.4 % (ref 11.5–15.5)
WBC: 6.5 10*3/uL (ref 4.0–10.5)
nRBC: 0 % (ref 0.0–0.2)

## 2021-12-03 LAB — RENAL FUNCTION PANEL
Albumin: 3.3 g/dL — ABNORMAL LOW (ref 3.5–5.0)
Anion gap: 16 — ABNORMAL HIGH (ref 5–15)
BUN: 115 mg/dL — ABNORMAL HIGH (ref 6–20)
CO2: 22 mmol/L (ref 22–32)
Calcium: 8.5 mg/dL — ABNORMAL LOW (ref 8.9–10.3)
Chloride: 87 mmol/L — ABNORMAL LOW (ref 98–111)
Creatinine, Ser: 7.4 mg/dL — ABNORMAL HIGH (ref 0.44–1.00)
GFR, Estimated: 6 mL/min — ABNORMAL LOW (ref >60)
Glucose, Bld: 119 mg/dL — ABNORMAL HIGH (ref 70–99)
Phosphorus: 7.9 mg/dL — ABNORMAL HIGH (ref 2.5–4.6)
Potassium: 5.5 mmol/L — ABNORMAL HIGH (ref 3.5–5.1)
Sodium: 125 mmol/L — ABNORMAL LOW (ref 135–145)

## 2021-12-03 LAB — GLUCOSE, CAPILLARY
Glucose-Capillary: 104 mg/dL — ABNORMAL HIGH (ref 70–99)
Glucose-Capillary: 93 mg/dL (ref 70–99)
Glucose-Capillary: 158 mg/dL — ABNORMAL HIGH (ref 70–99)

## 2021-12-03 MED ORDER — HEPARIN SODIUM (PORCINE) 1000 UNIT/ML DIALYSIS: 20.0000 [IU]/kg | INTRAMUSCULAR | Status: DC | PRN

## 2021-12-03 NOTE — Progress Notes (Signed)
[name] Progress Note   Subjective:    Seen and examined patient at bedside. She is feeling better. Plan for HD this afternoon.  Objective Vitals:   12/02/21 0559 12/02/21 1756 12/02/21 2139 12/03/21 0924  BP: (!) 151/54 (!) 136/58 114/60 (!) 153/66  Pulse: (!) 59 (!) 55 (!) 56 61  Resp: 18 16 18 20   Temp: 97.9 F (36.6 C) 98.2 F (36.8 C) 98.2 F (36.8 C) 98.8 F (37.1 C)  TempSrc: Oral Oral Oral Oral  SpO2: 100% 98% 98% 100%  Weight:      Height:       Physical Exam General: Well appearing woman, NAD. Room air Heart: RRR; no murmur Lungs: CTA anteriorly Abdomen: soft Extremities: L BKA + stump edema, RLE 1+ edema Dialysis Access: L AVF + thrill  Filed Weights   11/27/21 1250 11/29/21 0756 11/29/21 1221  Weight: 91.6 kg 95.9 kg 91.4 kg    Intake/Output Summary (Last 24 hours) at 12/03/2021 1358 Last data filed at 12/03/2021 0925 Gross per 24 hour  Intake 480 ml  Output 0 ml  Net 480 ml    Additional Objective Labs: Basic Metabolic Panel: Recent Labs  Lab 11/27/21 0841 11/28/21 0433 11/29/21 0815 12/02/21 0511  NA 128* 132* 129* 128*  K 5.0 4.4 5.0 4.9  CL 89* 88* 90* 87*  CO2 27 29 29 26   GLUCOSE 151* 126* 129* 164*  BUN 78* 49* 80* 95*  CREATININE 6.00* 4.44* 5.84* 6.71*  CALCIUM 9.2 9.9 9.2 8.9  PHOS 6.2* 4.7* 5.7*  --    Liver Function Tests: Recent Labs  Lab 11/28/21 0433 11/29/21 0815 12/02/21 0511  AST  --   --  16  ALT  --   --  15  ALKPHOS  --   --  105  BILITOT  --   --  0.6  PROT  --   --  7.6  ALBUMIN 3.2* 3.1* 3.3*   No results for input(s): "LIPASE", "AMYLASE" in the last 168 hours. CBC: Recent Labs  Lab 11/27/21 0842 11/28/21 0433 11/29/21 0814 12/02/21 0511  WBC 7.3 7.4 7.0 6.7  NEUTROABS  --  4.1  --  3.2  HGB 10.5* 11.1* 10.5* 10.6*  HCT 32.1* 34.4* 31.5* 31.7*  MCV 83.6 85.4 84.0 83.2  PLT 184 181 175 183   Blood Culture    Component Value Date/Time   SDES TISSUE 08/28/2021 0917    SPECREQUEST LEFT THIGH TISSUE 08/28/2021 0917   CULT  08/28/2021 0917    MODERATE ENTEROCOCCUS FAECIUM VANCOMYCIN RESISTANT ENTEROCOCCUS ISOLATED NO ANAEROBES ISOLATED Sent to [place] for further susceptibility testing. Performed at [facility], [place] [address] [place], [place] [address]    REPTSTATUS 09/02/2021 FINAL 08/28/2021 1638    Cardiac Enzymes: No results for input(s): "CKTOTAL", "CKMB", "CKMBINDEX", "TROPONINI" in the last 168 hours. CBG: Recent Labs  Lab 12/02/21 1159 12/02/21 1648 12/02/21 2140 12/03/21 0737 12/03/21 1157  GLUCAP 144* 123* 174* 104* 93   Iron Studies: No results for input(s): "IRON", "TIBC", "TRANSFERRIN", "FERRITIN" in the last 72 hours. Lab Results  Component Value Date   INR 1.1 12/28/2019   INR 1.2 12/17/2018   INR 0.99 04/07/2017   Studies/Results: DG Knee 1-2 Views Left  Result Date: 12/02/2021 CLINICAL DATA:  Left knee injury, history of remote trauma May 2023 EXAM: LEFT KNEE - 1-2 VIEW COMPARISON:  07/21/2021 FINDINGS: Frontal and lateral views of the left knee are obtained. Prior below-knee amputation again noted. The  comminuted distal left femoral fracture seen previously demonstrates significant interval healing, with bony callus formation across the prior fracture site. Faint fracture line is still apparent. Bones are osteopenic. No joint effusion. Moderate diffuse subcutaneous edema. IMPRESSION: 1. Healing comminuted distal left femoral metadiaphyseal fracture, with bridging callus formation identified as above. Residual fracture line is still apparent. 2. Diffuse subcutaneous edema. 3. Prior left below-knee amputation. Electronically Signed   By: Randa Ngo M.D.   On: 12/02/2021 20:11    Medications:  methocarbamol (ROBAXIN) IV      (feeding supplement) PROSource Plus  30 mL Oral BID BM   alosetron  1 mg Oral BID   amitriptyline  25 mg Oral QHS   amLODipine  10 mg Oral Q24H   atorvastatin  40 mg Oral q1800   carvedilol   6.25 mg Oral BID   cyclobenzaprine  10 mg Oral TID   docusate sodium  200 mg Oral BID   DULoxetine  60 mg Oral Daily   famotidine  20 mg Oral Daily   furosemide  80 mg Oral Daily   heparin  5,000 Units Subcutaneous Q8H   hydrALAZINE  25 mg Oral TID   insulin aspart  0-9 Units Subcutaneous TID WC   insulin aspart  4 Units Subcutaneous TID WC   insulin glargine-yfgn  7 Units Subcutaneous QHS   multivitamin  1 tablet Oral QHS   pantoprazole  80 mg Oral Daily   polyethylene glycol  17 g Oral Daily   sevelamer carbonate  2,400 mg Oral TID WC   sodium zirconium cyclosilicate  10 g Oral Once per day on Sun Tue Thu Sat    Dialysis Orders: MWF at [facility], 400/500, EDW 90kg, 2K/2Ca, LUE AVF - heparin 4000 unit IV bolus - No ESA  Assessment/Plan: Recurrent L hip osteomyelitis: S/p 08/10/21 surgery (removal hip IMN which was placed  12/2019) with persistent VRE infection. Back to OR 08/28/21, 08/31/21 for L hip debridement and wound vac placement (now removed). Finished 6 week course of IV Daptomycin on 10/12/21. Management per primary team.  Chronic L hip/stump pain: She does have fractures of L femoral neck (where hardware was removed) as well as distal femur Fx. Pain control per primary.  ESRD: Continue HD on usual MWF - next HD today. Hyperkalemia: Recurrent issues, on 2K bath + furosemide. Lokelma 10g added on non-HD days -> now prn. K+ today 4.9. AVF dysfunction: S/p branch ligation in OR 09/19/2021 per Dr. Virl Cagey. AVF working well now. HTN/volume: BP controlled on amlodipine/hydralazine/Coreg. Edema  still present  - reminded on fluid restrictions (exceeds). Getting ~ 4-4.5L off q HD. Anemia of ESRD: Hgb 10.6, not on ESA.  Secondary HPTH: CorrCa slightly high, not on VDRA. Phos up slightly. Continue Renvela as binder. Nutrition: Alb low, continue protein supplements. DM2: Insulin per primary Dispo: SNF previously recommended, now she would like to go home on discharge even though  HD unit has been clear that they are prohibited by company policy to assist patient out of her car and into the facility. Patient is aware of this - says she is going to work with transfer with PT.  Asks about home HD but they would NOT be able to train her since she is in the hospital. Dialyzed in recliner on 9/4 but signed off early due to pain, has declined to come in a recliner since then.  Tobie Poet, NP [facility] 12/03/2021,1:58 PM  LOS: 99 days

## 2021-12-03 NOTE — Progress Notes (Signed)
PT Cancellation Note  Patient Details Name: [name] MRN: [id number] DOB: 1965/05/03   Cancelled Treatment:    Reason Eval/Treat Not Completed: Patient at procedure or test/unavailable  Patient refused dialysis yesterday and therefore is in dialysis now. PT will plan to adjust our schedule to see her 12/04/21.    [place]  Office [phone]  [name] 12/03/2021, 2:10 PM

## 2021-12-03 NOTE — Progress Notes (Signed)
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 (prior toe amputation [place] potation 2014 2015) status post IM nail 12/29/2019--underwent irrigation debridement left hip previously 01/19/2020 and was treated with ertapenem 2/2 Enterobacter/ Bacteroides Bipolar   Patient fell hit the trying to transfer from wheelchair to recliner 5/19 witnessed--found to have distal femoral fracture deemed nonoperative she was admitted 6/3-through 6/23 and underwent partial resection of femur for osteomyelitis of infected soft tissue and rearrangement of bony fragments 20 x 15 cm and was on antibiotics as guided by ID for cultures growing Staph aureus   Patient presented secondary to copious left hip bleeding and found to have evidence of subacute osteomyelitis and abscess of left femur. Orthopedic surgery/infectious disease consulted. Patient was started on empiric antibiotics and underwent multiple I&Ds by Dr. [name] on 1/27, had application of Kerecis micro powder 7/1 with local tissue rearrangement 40 x 10 cm  She was also seen by vascular surgery who did a fistula branch ligation  Patient completed a 6 week course of daptomycin 10/09/21  Discharge is pending ability for patient to tolerate HD in a chair which is complicated by patient's refusal/inability to sit in a chair secondary to pain. Patient tolerated HD in a chair on 9/4.    We will attemp In the next several days discussion about setting up in chair and she seems more amenable to talking with TOC regarding going to a skilled facility-daughter request to be informed about options for SNF  Assessment and Plan: ESRD on HD MWF/anasarca -Per nephrology.  TOC Education officer, museum for dialysis is talking with other kidney  centers -Still on Lasix 80 daily-[for hyperkalemia]continuing calcium carbonate 200 every 2 as needed, Renvela 2400 3 times daily -Has to do HD in chair for outpatient HD,--she might re-consider SNF-CSW to follow after PT re-evals -Cont to encourage fluid restriction   Subacute osteomyelitis of left femur with abscess/infected left hip hardware s/p removal on 6/10 prior to admission with I&D on 6/28 and 7/1 -Completed 6 weeks of appropriate antibiotics per ID recommendation.  -CT hip on 9/2 with fluid collection and soft tissue thickening.   -Ortho rec-outpatient f/up with Dr. [name] 1 wk after discharge and follow-up -- re-image L knee 10/2 shows fracture line still visible but callus formation provided her reassurance that it is improving and encouraged her to sit up and mobilize more with therapy -Low suspicion for persistent infection. -Pain control with Oxy IR 15 every 3 as needed may be better off switching to Dilaudid or fentanyl given dialysis. De-escaalte meds as able-May need if mobilizing more with therapy actually more    History of left intertrochanteric fracture: previously managed with IM nail placement in 2021. Hardware removed secondary to above.   Hyponatremia/Hypokalemia/Hyperkalemia/hypomagnesemia -Management with hemodialysis-continues to have mild hyperKL and hyponatremia Lokelma TTSSu   ABLA superimposed on anemia of chronic disease: Stable. -Monitor intermittently   Controlled IDDM-2 with hyperglycemia and ESRD: A1c 5.9%. -Continue SSI-sensitive -Continue NovoLog 4 units 3 times daily with meals -Stable on Semglee 7 units at night -Glucose trends stable in 144-164  overall astable    Primary hypertension -Continue amlodipine 10 mg, hydralazine 25 x 3 times daily and Coreg 6.25 twice daily   History of CVA/HLD -Continue Lipitor   Right knee pain: Likely  osteoarthritis.  Patient reported worsening right knee pain --encouraged mobility -Xray of from 9/19  reviewed. Neg -Continue pain meds   GERD -Continue Pepcid and Protonix   Tobacco use -Tobacco cessation discussed during this admission.   Anxiety and depression: -Continue amitriptyline 25 Cymbalta DR 60 -Seems stable      Subjective:   Nauseous yesterday but that is improved Pepcid seemed to help-eating and drinking some no chest pain no fever  Physical Exam: Vitals:   12/02/21 0559 12/02/21 1756 12/02/21 2139 12/03/21 0924  BP: (!) 151/54 (!) 136/58 114/60 (!) 153/66  Pulse: (!) 59 (!) 55 (!) 56 61  Resp: 18 16 18 20   Temp: 97.9 F (36.6 C) 98.2 F (36.8 C) 98.2 F (36.8 C) 98.8 F (37.1 C)  TempSrc: Oral Oral Oral Oral  SpO2: 100% 98% 98% 100%  Weight:      Height:       awake no distress no icterus no pallor Chest is clear no added sound No wheeze no rales no rhonchi Left hip not examined however she is able to move her left stump  Data Reviewed:  Labs from yesterday show sodium 128 BUNs/creatinine 95/6.7 Hemoglobin 10.6  Family Communication: patient alone-no family today  Disposition: Status is: Inpatient Remains inpatient appropriate because: Severity of illness  Planned Discharge Destination:  Unclear at this time--no safe d/c plan    Author: Nita Sells, MD 12/03/2021 2:30 PM  For on call review [email or web address].

## 2021-12-04 DIAGNOSIS — K2100 Gastro-esophageal reflux disease with esophagitis, without bleeding: Secondary | ICD-10-CM | POA: Diagnosis not present

## 2021-12-04 DIAGNOSIS — F419 Anxiety disorder, unspecified: Secondary | ICD-10-CM | POA: Diagnosis not present

## 2021-12-04 DIAGNOSIS — N186 End stage renal disease: Secondary | ICD-10-CM | POA: Diagnosis not present

## 2021-12-04 DIAGNOSIS — M86252 Subacute osteomyelitis, left femur: Secondary | ICD-10-CM | POA: Diagnosis not present

## 2021-12-04 LAB — RENAL FUNCTION PANEL
Albumin: 3.1 g/dL — ABNORMAL LOW (ref 3.5–5.0)
Anion gap: 12 (ref 5–15)
BUN: 58 mg/dL — ABNORMAL HIGH (ref 6–20)
CO2: 27 mmol/L (ref 22–32)
Calcium: 9 mg/dL (ref 8.9–10.3)
Chloride: 90 mmol/L — ABNORMAL LOW (ref 98–111)
Creatinine, Ser: 5.06 mg/dL — ABNORMAL HIGH (ref 0.44–1.00)
GFR, Estimated: 10 mL/min — ABNORMAL LOW (ref >60)
Glucose, Bld: 134 mg/dL — ABNORMAL HIGH (ref 70–99)
Phosphorus: 4.9 mg/dL — ABNORMAL HIGH (ref 2.5–4.6)
Potassium: 4.2 mmol/L (ref 3.5–5.1)
Sodium: 129 mmol/L — ABNORMAL LOW (ref 135–145)

## 2021-12-04 LAB — GLUCOSE, CAPILLARY
Glucose-Capillary: 127 mg/dL — ABNORMAL HIGH (ref 70–99)
Glucose-Capillary: 169 mg/dL — ABNORMAL HIGH (ref 70–99)
Glucose-Capillary: 122 mg/dL — ABNORMAL HIGH (ref 70–99)
Glucose-Capillary: 151 mg/dL — ABNORMAL HIGH (ref 70–99)

## 2021-12-04 MED ORDER — ALTEPLASE 2 MG IJ SOLR: 2.0000 mg | Freq: Once | INTRAMUSCULAR | Status: DC | PRN

## 2021-12-04 MED ORDER — PENTAFLUOROPROP-TETRAFLUOROETH EX AERO: 1.0000 | INHALATION_SPRAY | CUTANEOUS | Status: DC | PRN

## 2021-12-04 MED ORDER — HEPARIN SODIUM (PORCINE) 1000 UNIT/ML DIALYSIS
20.0000 [IU]/kg | INTRAMUSCULAR | Status: DC | PRN
  Filled 2021-12-04: qty 2

## 2021-12-04 MED ORDER — HEPARIN SODIUM (PORCINE) 1000 UNIT/ML DIALYSIS: 1000.0000 [IU] | INTRAMUSCULAR | Status: DC | PRN

## 2021-12-04 MED ORDER — CHLORHEXIDINE GLUCONATE CLOTH 2 % EX PADS
6.0000 | MEDICATED_PAD | Freq: Every day | CUTANEOUS | Status: DC
  Administered 2021-12-05 – 2021-12-06 (×2): 6 via TOPICAL

## 2021-12-04 NOTE — Progress Notes (Signed)
[facility]                                                                              [name], is a 56 y.o. female, DOB - 1965-07-23, ZES:[id number] Admit date - 08/26/2021    Outpatient Primary MD for the patient is [name], MD  LOS - 100  days  Chief Complaint  Patient presents with   Wound Infection       Brief summary   Patient is a 56 year old female with HTN, HLD, IDDM, ESRD on HD MWF, left BKA 2021, 01/12/2020 for chronic calcaneus osteomyelitis, bipolar disorder presented with fall and wound infection.  Patient fell while trying to transfer from wheelchair to recliner on 5/19 and was found to have distal femoral fracture, deemed nonoperative.  She was admitted 6/3-6/23 and underwent partial resection of femur for osteomyelitis of infected soft tissue and rearrangement of bony fragments, was on antibiotics as guided by ID with cultures growing Staph aureus.  Patient presented secondary to copious left hip bleeding and found to have evidence of subacute osteomyelitis and abscess of left femur.  Orthopedics/ID was consulted.  Patient was started on empiric antibiotics and underwent multiple I&D's by Dr. [name] on 2/63, had application of Kerecis micro powder 7/1 with local tissue rearrangement.  She was also seen by vascular surgery, did with fistula branch ligation, completed 6 weeks course of daptomycin on 10/09/2021.  Discharge pending due to inability to tolerate HD in a chair, complicated by patient's refusal/inability to sit in a chair secondary to pain.  She tolerated HD in chair on 9/4.  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 [name] 1 week after discharge -Left knee reimaging on 10/2 showed the fracture line still visible but callus formation provided her reassurance -Continue pain control    Active Problems:   ESRD (end stage renal disease) on dialysis ([place]), MWF -Continue HD per nephrology, still on Lasix -Has to do HD chair for outpatient hemodialysis, complicated by patient's refusal/inability to sit in a chair    Type 2 diabetes mellitus with hyperlipidemia ([place]) -Continue SSI, Lantus 7 units nightly, meal coverage 4 units 3 times daily AC Recent Labs    12/02/21 2140 12/03/21 0737 12/03/21 1157 12/03/21 2147 12/04/21 0735 12/04/21 1109  GLUCAP 174* 104* 93 158* 127* 169*    History of left intertrochanteric fracture -Previously managed with IM nail placement in 2021, hardware removed secondary to #1  Hyponatremia, hyperkalemia, hypomagnesemia -Electrolytes to be managed with HD and Lokelma as needed for hyperkalemia  ABLA superimposed on anemia of chronic disease -H&H currently stable, continue to monitor    History of CVA (cerebrovascular accident) -Continue Lipitor  Primary hypertension -Continue amlodipine, hydralazine, Coreg  Right knee pain -Likely osteoarthritis, x-ray from 9/19 negative for any fracture   GERD -Continue Pepcid, Protonix  Anxiety, depression -Continue amitriptyline, Cymbalta   Code Status: Full code DVT Prophylaxis:  Place TED hose Start: 09/06/21 1507 SCDs Start: 08/31/21 1058 heparin injection 5,000 Units Start: 08/26/21 1615   Level of Care: Level of care: Med-Surg Family Communication: Updated patieny  Disposition Plan:      Remains inpatient appropriate: Difficult placement due to inability to sit in the chair for HD   Procedures:  Hemodialysis  Consultants:   Nephrology Orthopedics Discussed surgery  Antimicrobials:   Anti-infectives (From admission, onward)    Start     Dose/Rate Route Frequency Ordered Stop    10/02/21 2000  DAPTOmycin (CUBICIN) 900 mg in sodium chloride 0.9 % IVPB  Status:  Discontinued        10 mg/kg  92.4 kg 136 mL/hr over 30 Minutes Intravenous Once per day on Mon Wed Fri 10/01/21 1350 10/12/21 1146   09/30/21 2000  DAPTOmycin (CUBICIN) 900 mg in sodium chloride 0.9 % IVPB  Status:  Discontinued        10 mg/kg  92.4 kg 136 mL/hr over 30 Minutes Intravenous Daily 09/28/21 0919 10/01/21 1350   09/28/21 2000  DAPTOmycin (CUBICIN) 900 mg in sodium chloride 0.9 % IVPB        900 mg 136 mL/hr over 30 Minutes Intravenous  Once 09/28/21 0919 09/28/21 2146   09/02/21 2000  DAPTOmycin (CUBICIN) 900 mg in sodium chloride 0.9 % IVPB  Status:  Discontinued        10 mg/kg  92.4 kg 136 mL/hr over 30 Minutes Intravenous Once per day on Mon Wed Fri 09/02/21 0836 09/28/21 0919   08/31/21 0745  ceFAZolin (ANCEF) IVPB 2g/100 mL premix        2 g 200 mL/hr over 30 Minutes Intravenous To Short Stay 08/31/21 0730 08/31/21 0918   08/30/21 0600  ceFAZolin (ANCEF) IVPB 2g/100 mL premix  Status:  Discontinued        2 g 200 mL/hr over 30 Minutes Intravenous To Short Stay 08/30/21 0123 08/31/21 0600   08/29/21 2000  DAPTOmycin (CUBICIN) 900 mg in sodium chloride 0.9 % IVPB  Status:  Discontinued        10 mg/kg  92.4 kg 136 mL/hr over 30 Minutes Intravenous Every 48 hours 08/29/21 1627 09/02/21 0836   08/28/21 1800  ceFAZolin (ANCEF) IVPB 2g/100 mL premix  Status:  Discontinued        2 g 200 mL/hr over 30 Minutes Intravenous Every M-W-F (1800) 08/27/21 0846 08/29/21 1620   08/28/21 1200  ceFAZolin (ANCEF) IVPB 1 g/50 mL premix  Status:  Discontinued        1 g 100 mL/hr over 30 Minutes Intravenous Every M-W-F (Hemodialysis) 08/26/21 1954 08/27/21 0846   08/28/21 0600  ceFAZolin (ANCEF) IVPB 2g/100 mL premix        2 g 200 mL/hr over 30 Minutes Intravenous On call to O.R. 08/27/21 1150 08/28/21 0909   08/27/21 1800  ceFAZolin (ANCEF) IVPB 1 g/50 mL premix        1 g 100 mL/hr over 30  Minutes Intravenous  Once 08/26/21 1954 08/27/21 2048   08/26/21 1500  ceFEPIme (MAXIPIME) 1 g in sodium chloride 0.9 % 100 mL IVPB  Status:  Discontinued        1 g 200 mL/hr over 30 Minutes Intravenous Every 24 hours 08/26/21 1452 08/26/21 1714          Medications  (feeding supplement) PROSource Plus  30 mL Oral BID BM   alosetron  1 mg Oral BID   amitriptyline  25 mg Oral QHS   amLODipine  10 mg Oral Q24H   atorvastatin  40 mg Oral q1800   carvedilol  6.25 mg Oral BID   cyclobenzaprine  10 mg Oral TID   docusate sodium  200 mg Oral BID   DULoxetine  60 mg Oral Daily   famotidine  20 mg Oral Daily   furosemide  80 mg Oral Daily   heparin  5,000 Units Subcutaneous Q8H   hydrALAZINE  25 mg Oral TID   insulin aspart  0-9 Units Subcutaneous TID WC   insulin aspart  4 Units Subcutaneous TID WC   insulin glargine-yfgn  7 Units Subcutaneous QHS   multivitamin  1 tablet Oral QHS   pantoprazole  80 mg Oral Daily   polyethylene glycol  17 g Oral Daily   sevelamer carbonate  2,400 mg Oral TID WC   sodium zirconium cyclosilicate  10 g Oral Once per day on Sun Tue Thu Sat      Subjective:   [name] was seen and examined today.   No ongoing nausea or vomiting.  States she has pain which causes an inability to sit in the chair for HD.  No fevers or chills, no chest pain.   Objective:   Vitals:   12/03/21 1913 12/03/21 2143 12/04/21 0651 12/04/21 0850  BP:  (!) 133/52 (!) 130/51 (!) 135/54  Pulse:  65 (!) 59 64  Resp:  16 16 16   Temp:  98.3 F (36.8 C) 98.5 F (36.9 C) 98.4 F (36.9 C)  TempSrc:  Oral Oral Oral  SpO2:  95% 98% 100%  Weight: 92 kg     Height:        Intake/Output Summary (Last 24 hours) at 12/04/2021 1306 Last data filed at 12/04/2021 1100 Gross per 24 hour  Intake 915 ml  Output 4700 ml  Net -3785 ml     Wt Readings from Last 3 Encounters:  12/03/21 92 kg  08/23/21 96.5 kg  08/07/21 101.1 kg     Exam General: Alert and oriented x 3,  NAD Cardiovascular: S1 S2 auscultated,  RRR Respiratory: Clear to auscultation bilaterally Gastrointestinal: Soft, nontender, nondistended, + bowel sounds Ext: left BKA Neuro: no new deficits Psych: Normal affect and demeanor, alert and oriented x3     Data Reviewed:  I have personally reviewed following labs    CBC Lab Results  Component Value Date   WBC 6.5 12/03/2021   RBC 3.60 (L) 12/03/2021   HGB 9.9 (L) 12/03/2021   HCT 30.2 (L) 12/03/2021   MCV 83.9 12/03/2021   MCH 27.5 12/03/2021   PLT 191 12/03/2021   MCHC 32.8 12/03/2021   RDW 15.4 12/03/2021   LYMPHSABS 2.2 12/02/2021   MONOABS 1.0 12/02/2021   EOSABS 0.2 12/02/2021   BASOSABS 0.1 72/11/4707     Last metabolic panel Lab Results  Component Value Date   NA 129 (L) 12/04/2021   K 4.2 12/04/2021   CL 90 (L) 12/04/2021   CO2 27 12/04/2021   BUN 58 (H) 12/04/2021   CREATININE 5.06 (H) 12/04/2021   GLUCOSE 134 (H) 12/04/2021   GFRNONAA 10 (L) 12/04/2021   GFRAA 13 (L) 12/17/2018   CALCIUM 9.0 12/04/2021   PHOS 4.9 (H) 12/04/2021   PROT 7.6 12/02/2021   ALBUMIN 3.1 (L) 12/04/2021   LABGLOB 3.1 10/29/2018   AGRATIO 1.4 10/29/2018   BILITOT 0.6 12/02/2021   ALKPHOS 105 12/02/2021  AST 16 12/02/2021   ALT 15 12/02/2021   ANIONGAP 12 12/04/2021    CBG (last 3)  Recent Labs    12/03/21 2147 12/04/21 0735 12/04/21 1109  GLUCAP 158* 127* 169*      Coagulation Profile: No results for input(s): "INR", "PROTIME" in the last 168 hours.   Radiology Studies: I have personally reviewed the imaging studies  DG Knee 1-2 Views Left  Result Date: 12/02/2021 CLINICAL DATA:  Left knee injury, history of remote trauma May 2023 EXAM: LEFT KNEE - 1-2 VIEW COMPARISON:  07/21/2021 FINDINGS: Frontal and lateral views of the left knee are obtained. Prior below-knee amputation again noted. The comminuted distal left femoral fracture seen previously demonstrates significant interval healing, with bony callus  formation across the prior fracture site. Faint fracture line is still apparent. Bones are osteopenic. No joint effusion. Moderate diffuse subcutaneous edema. IMPRESSION: 1. Healing comminuted distal left femoral metadiaphyseal fracture, with bridging callus formation identified as above. Residual fracture line is still apparent. 2. Diffuse subcutaneous edema. 3. Prior left below-knee amputation. Electronically Signed   By: Randa Ngo M.D.   On: 12/02/2021 20:11       Dason Mosley M.D. [facility] 12/04/2021, 1:06 PM  Available via Epic secure chat 7am-7pm After 7 pm, please refer to night coverage provider listed on amion.

## 2021-12-04 NOTE — Progress Notes (Signed)
Physical Therapy Treatment Patient Details Name: [name] MRN: [id number] DOB: 06/13/1965 Today's Date: 12/04/2021   History of Present Illness 56 y.o. female presented to ED 6/26 from dialysis with increased bloody drainage from L hip wound. 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 s/p hardware removal of left hip with partial resection of tuft tissue and femure that were nonviable on 08/10/21 Discharged home 6/23. underwent L hip debridement and wound vac placement 6/28; +Left intertrochanteric non-union,  Fracture of left distal femur  PMH: hypertension, hyperlipidemia, ESRD on HD MWF, history of left BKA in 2021, depression/anxiety, stroke, tobacco use, T2DM,  insomnia, chronic pain syndrome,    PT Comments    Patient continues to limit the activities she will work on during PT sessions. Despite being told when to expect PT this morning, she did not call ahead for pain medication. She received her pain medicine just as PT arrived and unfortunately PT could not return at a later time. Patient agreed to participate and ultimately stood from elevated EOB x 1 for ~20 seconds. She was limited by Rt knee and left heel pain (at site of achilles insertion). She reports her heel has been hurting since trying to stand in the parallel bars 9/28. She refused OOB to recliner stating she was in too much pain today. Reiterated that she is hurting herself by not working on sitting up in recliner and she again flatly refused. Goal update due next visit.    Recommendations for follow up therapy are one component of a multi-disciplinary discharge planning process, led by the attending physician.  Recommendations may be updated based on patient status, additional functional criteria and insurance authorization.  Follow Up Recommendations  Skilled nursing-short term rehab (<3 hours/day) Can patient physically be transported by private vehicle: No   Assistance Recommended at  Discharge Intermittent Supervision/Assistance  Patient can return home with the following A lot of help with bathing/dressing/bathroom;Assistance with cooking/housework;Assist for transportation;Two people to help with walking and/or transfers;Help with stairs or ramp for entrance   Equipment Recommendations  None recommended by PT    Recommendations for Other Services       Precautions / Restrictions Precautions Precautions: Fall;Other (comment) Precaution Comments: L BKA (baseline) Restrictions Weight Bearing Restrictions: Yes LLE Weight Bearing: Weight bearing as tolerated Other Position/Activity Restrictions: L distal femur fx; per Dr [name] 8/10 pt can full WB     Mobility  Bed Mobility Overal bed mobility: Needs Assistance Bed Mobility: Supine to Sit, Sit to Supine     Supine to sit: Supervision Sit to supine: Supervision   General bed mobility comments: incr time, effort, use of HOB elevated and rail    Transfers Overall transfer level: Needs assistance Equipment used: Rolling walker (2 wheels) Transfers: Sit to/from Stand Sit to Stand: Max assist, +2 physical assistance, From elevated surface           General transfer comment: pt stood from EOB x 1 for ~20 seconds; reported increased pain at insertion site of achilles tendon and rt knee pain (despite wearing the new compression sleeve knee brace she obtained). Refused OOB to recliner via sliding board stating she was in too much pain and did not get her pain medicine until just before PT arrived (was due one hour prior, but pt did not call for meds despite being told earlier today when to expect PT). Reminded her that she promised to begin working on sitting up in chair for longer and longer periods  of time and she continued to refuse.    Ambulation/Gait                   Stairs             Wheelchair Mobility    Modified Rankin (Stroke Patients Only)       Balance Overall balance  assessment: Needs assistance Sitting-balance support: Feet supported Sitting balance-Leahy Scale: Good Sitting balance - Comments: no assistance with sitting balance   Standing balance support: Bilateral upper extremity supported, During functional activity, Reliant on assistive device for balance Standing balance-Leahy Scale: Zero                              Cognition Arousal/Alertness: Awake/alert Behavior During Therapy: WFL for tasks assessed/performed Overall Cognitive Status: Impaired/Different from baseline Area of Impairment: Awareness                         Safety/Judgement: Decreased awareness of deficits, Decreased awareness of safety Awareness: Emergent   General Comments: says she understands that she will get worse, but yet still refuses to transfer to chair        Exercises Total Joint Exercises Ankle Circles/Pumps: AROM, Right, 10 reps Other Exercises Other Exercises: left knee contract-relax stretching into extension. Continues to lack ~45 degrees extension. Of note, pt was not wearing shrinker sock on LLE on arrival with incr edema noted. Donned sock for her with total assist. (pt cannot reach her leg to don herself) Other Exercises: left hip contract-relax into internal rotation with pt lacking ~45 degrees from neutral.    General Comments        Pertinent Vitals/Pain Pain Assessment Pain Assessment: Faces Faces Pain Scale: Hurts whole lot Pain Location: right knee and ankle Pain Descriptors / Indicators: Discomfort, Grimacing, Guarding Pain Intervention(s): Limited activity within patient's tolerance, Monitored during session, Premedicated before session    Home Living                          Prior Function            PT Goals (current goals can now be found in the care plan section) Acute Rehab PT Goals Patient Stated Goal: less pain with movement Time For Goal Achievement: 12/05/21 Potential to Achieve  Goals: Fair Progress towards PT goals: Not progressing toward goals - comment (refuses full participation)    Frequency    Min 2X/week      PT Plan Current plan remains appropriate    Co-evaluation              AM-PAC PT "6 Clicks" Mobility   Outcome Measure  Help needed turning from your back to your side while in a flat bed without using bedrails?: None Help needed moving from lying on your back to sitting on the side of a flat bed without using bedrails?: None Help needed moving to and from a bed to a chair (including a wheelchair)?: A Little Help needed standing up from a chair using your arms (e.g., wheelchair or bedside chair)?: Total Help needed to walk in hospital room?: Total Help needed climbing 3-5 steps with a railing? : Total 6 Click Score: 14    End of Session Equipment Utilized During Treatment: Gait belt Activity Tolerance: Patient limited by pain Patient left: in bed;with call bell/phone within reach   PT Visit Diagnosis:  Other abnormalities of gait and mobility (R26.89);Muscle weakness (generalized) (M62.81);History of falling (Z91.81);Pain Pain - Right/Left: Left Pain - part of body: Leg     Time: 4458-4835 PT Time Calculation (min) (ACUTE ONLY): 39 min  Charges:  $Therapeutic Activity: 38-52 mins                      Arby Barrette, PT Acute Rehabilitation Services  Office [phone]    [name] 12/04/2021, 11:37 AM

## 2021-12-04 NOTE — Progress Notes (Signed)
[facility] Progress Note   Subjective:    Seen and examined at bedside. Tolerated yesterday's HD with net UF 4.5L. Denies SOB and CP. Next HD 10/5.  Objective Vitals:   12/03/21 1913 12/03/21 2143 12/04/21 0651 12/04/21 0850  BP:  (!) 133/52 (!) 130/51 (!) 135/54  Pulse:  65 (!) 59 64  Resp:  16 16 16   Temp:  98.3 F (36.8 C) 98.5 F (36.9 C) 98.4 F (36.9 C)  TempSrc:  Oral Oral Oral  SpO2:  95% 98% 100%  Weight: 92 kg     Height:       Physical Exam General: Well appearing woman, NAD. Room air Heart: RRR; no murmur Lungs: CTA anteriorly Abdomen: soft Extremities: L BKA + stump edema, RLE 1+ edema Dialysis Access: L AVF + thrill    Filed Weights   11/29/21 1221 12/03/21 1417 12/03/21 1913  Weight: 91.4 kg 96.7 kg 92 kg    Intake/Output Summary (Last 24 hours) at 12/04/2021 1431 Last data filed at 12/04/2021 1100 Gross per 24 hour  Intake 915 ml  Output 4700 ml  Net -3785 ml    Additional Objective Labs: Basic Metabolic Panel: Recent Labs  Lab 11/29/21 0815 12/02/21 0511 12/03/21 1502 12/04/21 0433  NA 129* 128* 125* 129*  K 5.0 4.9 5.5* 4.2  CL 90* 87* 87* 90*  CO2 29 26 22 27   GLUCOSE 129* 164* 119* 134*  BUN 80* 95* 115* 58*  CREATININE 5.84* 6.71* 7.40* 5.06*  CALCIUM 9.2 8.9 8.5* 9.0  PHOS 5.7*  --  7.9* 4.9*   Liver Function Tests: Recent Labs  Lab 12/02/21 0511 12/03/21 1502 12/04/21 0433  AST 16  --   --   ALT 15  --   --   ALKPHOS 105  --   --   BILITOT 0.6  --   --   PROT 7.6  --   --   ALBUMIN 3.3* 3.3* 3.1*   No results for input(s): "LIPASE", "AMYLASE" in the last 168 hours. CBC: Recent Labs  Lab 11/28/21 0433 11/29/21 0814 12/02/21 0511 12/03/21 1502  WBC 7.4 7.0 6.7 6.5  NEUTROABS 4.1  --  3.2  --   HGB 11.1* 10.5* 10.6* 9.9*  HCT 34.4* 31.5* 31.7* 30.2*  MCV 85.4 84.0 83.2 83.9  PLT 181 175 183 191   Blood Culture    Component Value Date/Time   SDES TISSUE 08/28/2021 0917   SPECREQUEST LEFT  THIGH TISSUE 08/28/2021 0917   CULT  08/28/2021 0917    MODERATE ENTEROCOCCUS FAECIUM VANCOMYCIN RESISTANT ENTEROCOCCUS ISOLATED NO ANAEROBES ISOLATED Sent to [place] for further susceptibility testing. Performed at [facility], [place] [address], [place], [place] [address]    REPTSTATUS 09/02/2021 FINAL 08/28/2021 2025    Cardiac Enzymes: No results for input(s): "CKTOTAL", "CKMB", "CKMBINDEX", "TROPONINI" in the last 168 hours. CBG: Recent Labs  Lab 12/03/21 0737 12/03/21 1157 12/03/21 2147 12/04/21 0735 12/04/21 1109  GLUCAP 104* 93 158* 127* 169*   Iron Studies: No results for input(s): "IRON", "TIBC", "TRANSFERRIN", "FERRITIN" in the last 72 hours. Lab Results  Component Value Date   INR 1.1 12/28/2019   INR 1.2 12/17/2018   INR 0.99 04/07/2017   Studies/Results: DG Knee 1-2 Views Left  Result Date: 12/02/2021 CLINICAL DATA:  Left knee injury, history of remote trauma May 2023 EXAM: LEFT KNEE - 1-2 VIEW COMPARISON:  07/21/2021 FINDINGS: Frontal and lateral views of the left knee are obtained. Prior below-knee amputation again  noted. The comminuted distal left femoral fracture seen previously demonstrates significant interval healing, with bony callus formation across the prior fracture site. Faint fracture line is still apparent. Bones are osteopenic. No joint effusion. Moderate diffuse subcutaneous edema. IMPRESSION: 1. Healing comminuted distal left femoral metadiaphyseal fracture, with bridging callus formation identified as above. Residual fracture line is still apparent. 2. Diffuse subcutaneous edema. 3. Prior left below-knee amputation. Electronically Signed   By: Randa Ngo M.D.   On: 12/02/2021 20:11    Medications:  methocarbamol (ROBAXIN) IV      (feeding supplement) PROSource Plus  30 mL Oral BID BM   alosetron  1 mg Oral BID   amitriptyline  25 mg Oral QHS   amLODipine  10 mg Oral Q24H   atorvastatin  40 mg Oral q1800   carvedilol  6.25 mg Oral BID    cyclobenzaprine  10 mg Oral TID   docusate sodium  200 mg Oral BID   DULoxetine  60 mg Oral Daily   famotidine  20 mg Oral Daily   furosemide  80 mg Oral Daily   heparin  5,000 Units Subcutaneous Q8H   hydrALAZINE  25 mg Oral TID   insulin aspart  0-9 Units Subcutaneous TID WC   insulin aspart  4 Units Subcutaneous TID WC   insulin glargine-yfgn  7 Units Subcutaneous QHS   multivitamin  1 tablet Oral QHS   pantoprazole  80 mg Oral Daily   polyethylene glycol  17 g Oral Daily   sevelamer carbonate  2,400 mg Oral TID WC   sodium zirconium cyclosilicate  10 g Oral Once per day on Sun Tue Thu Sat    Dialysis Orders: MWF at [facility], 400/500, EDW 90kg, 2K/2Ca, LUE AVF - heparin 4000 unit IV bolus - No ESA  Assessment/Plan:  Recurrent L hip osteomyelitis: S/p 08/10/21 surgery (removal hip IMN which was placed  12/2019) with persistent VRE infection. Back to OR 08/28/21, 08/31/21 for L hip debridement and wound vac placement (now removed). Finished 6 week course of IV Daptomycin on 10/12/21. Management per primary team.  Chronic L hip/stump pain: She does have fractures of L femoral neck (where hardware was removed) as well as distal femur Fx. Pain control per primary.  ESRD: Continue HD on usual MWF - next HD 10/5. Hyperkalemia: Recurrent issues, on 2K bath + furosemide. Lokelma 10g added on non-HD days -> now prn. K+ today 4.2. AVF dysfunction: S/p branch ligation in OR 09/19/2021 per Dr. Virl Cagey. AVF working well now. HTN/volume: BP controlled on amlodipine/hydralazine/Coreg. Edema  still present  - reminded on fluid restrictions (exceeds). Getting ~ 4-4.5L off q HD. Anemia of ESRD: Hgb 9.9, not on ESA. Monitor trend for now. Secondary HPTH: CorrCa slightly high, not on VDRA. Phos up slightly. Continue Renvela as binder. Nutrition: Alb low, continue protein supplements. DM2: Insulin per primary Dispo: SNF previously recommended, now she would like to go home on discharge even  though HD unit has been clear that they are prohibited by company policy to assist patient out of her car and into the facility. Patient is aware of this - says she is going to work with transfer with PT.  Asks about home HD but they would NOT be able to train her since she is in the hospital. Dialyzed in recliner on 9/4 but signed off early due to pain, has declined to come in a recliner since then.  Donna Poet, NP [facility] 12/04/2021,2:31 PM  LOS: 100  days

## 2021-12-05 DIAGNOSIS — F419 Anxiety disorder, unspecified: Secondary | ICD-10-CM | POA: Diagnosis not present

## 2021-12-05 DIAGNOSIS — K2100 Gastro-esophageal reflux disease with esophagitis, without bleeding: Secondary | ICD-10-CM | POA: Diagnosis not present

## 2021-12-05 DIAGNOSIS — M86252 Subacute osteomyelitis, left femur: Secondary | ICD-10-CM | POA: Diagnosis not present

## 2021-12-05 DIAGNOSIS — N186 End stage renal disease: Secondary | ICD-10-CM | POA: Diagnosis not present

## 2021-12-05 LAB — GLUCOSE, CAPILLARY
Glucose-Capillary: 143 mg/dL — ABNORMAL HIGH (ref 70–99)
Glucose-Capillary: 150 mg/dL — ABNORMAL HIGH (ref 70–99)
Glucose-Capillary: 136 mg/dL — ABNORMAL HIGH (ref 70–99)
Glucose-Capillary: 215 mg/dL — ABNORMAL HIGH (ref 70–99)
Glucose-Capillary: 138 mg/dL — ABNORMAL HIGH (ref 70–99)

## 2021-12-05 LAB — RENAL FUNCTION PANEL
Albumin: 3.1 g/dL — ABNORMAL LOW (ref 3.5–5.0)
Anion gap: 13 (ref 5–15)
BUN: 88 mg/dL — ABNORMAL HIGH (ref 6–20)
CO2: 25 mmol/L (ref 22–32)
Calcium: 8.9 mg/dL (ref 8.9–10.3)
Chloride: 87 mmol/L — ABNORMAL LOW (ref 98–111)
Creatinine, Ser: 6.31 mg/dL — ABNORMAL HIGH (ref 0.44–1.00)
GFR, Estimated: 7 mL/min — ABNORMAL LOW (ref >60)
Glucose, Bld: 180 mg/dL — ABNORMAL HIGH (ref 70–99)
Phosphorus: 6.3 mg/dL — ABNORMAL HIGH (ref 2.5–4.6)
Potassium: 4.4 mmol/L (ref 3.5–5.1)
Sodium: 125 mmol/L — ABNORMAL LOW (ref 135–145)

## 2021-12-05 LAB — CBC
HCT: 29.4 % — ABNORMAL LOW (ref 36.0–46.0)
Hemoglobin: 9.7 g/dL — ABNORMAL LOW (ref 12.0–15.0)
MCH: 27.6 pg (ref 26.0–34.0)
MCHC: 33 g/dL (ref 30.0–36.0)
MCV: 83.5 fL (ref 80.0–100.0)
Platelets: 177 10*3/uL (ref 150–400)
RBC: 3.52 MIL/uL — ABNORMAL LOW (ref 3.87–5.11)
RDW: 15.3 % (ref 11.5–15.5)
WBC: 6.5 10*3/uL (ref 4.0–10.5)
nRBC: 0 % (ref 0.0–0.2)

## 2021-12-05 NOTE — Progress Notes (Signed)
Occupational Therapy Discharge Patient Details Name: [name] MRN: [id number] DOB: [birth year] Today's Date: 12/05/2021 Time: 6924-9324    Patient discharged from OT services secondary to patient has made no progress toward goals in a reasonable time frame.  Please see latest therapy progress note for current level of functioning and progress toward goals.    Progress and discharge plan discussed with patient and/or caregiver: Patient/Caregiver disagrees with plan, but is unable to determine goals to justify continuing OT. Pt endorses many reasons why her progress stopped, mostly related to pain.   Dr. Tana Coast made aware of discharge plan.       [name] 12/05/2021, 3:13 PM  Cleta Alberts, OTR/L Acute Rehabilitation Services Office: [phone]

## 2021-12-05 NOTE — Progress Notes (Signed)
[facility] Progress Note   Subjective:    Seen and examined patient on HD. No acute complaints/concerns. Tolerating UFG 4.5L.  Objective Vitals:   12/05/21 0600 12/05/21 0800 12/05/21 0836 12/05/21 0900  BP: (!) 132/53  (!) 131/54 136/61  Pulse: (!) 57 66 67 (!) 58  Resp: 18 18 16  (!) 8  Temp: 98.5 F (36.9 C) 98.7 F (37.1 C) 98.9 F (37.2 C)   TempSrc: Oral Oral    SpO2: 100% 100% 100% 99%  Weight:      Height:       Physical Exam General: Well appearing woman, NAD. Room air Heart: RRR; no murmur Lungs: CTA anteriorly Abdomen: soft Extremities: L BKA + stump edema, RLE 1+ edema Dialysis Access: L AVF + thrill   Filed Weights   11/29/21 1221 12/03/21 1417 12/03/21 1913  Weight: 91.4 kg 96.7 kg 92 kg    Intake/Output Summary (Last 24 hours) at 12/05/2021 0935 Last data filed at 12/05/2021 0800 Gross per 24 hour  Intake 1295 ml  Output 200 ml  Net 1095 ml    Additional Objective Labs: Basic Metabolic Panel: Recent Labs  Lab 12/03/21 1502 12/04/21 0433 12/05/21 0856  NA 125* 129* 125*  K 5.5* 4.2 4.4  CL 87* 90* 87*  CO2 22 27 25   GLUCOSE 119* 134* 180*  BUN 115* 58* 88*  CREATININE 7.40* 5.06* 6.31*  CALCIUM 8.5* 9.0 8.9  PHOS 7.9* 4.9* 6.3*   Liver Function Tests: Recent Labs  Lab 12/02/21 0511 12/03/21 1502 12/04/21 0433 12/05/21 0856  AST 16  --   --   --   ALT 15  --   --   --   ALKPHOS 105  --   --   --   BILITOT 0.6  --   --   --   PROT 7.6  --   --   --   ALBUMIN 3.3* 3.3* 3.1* 3.1*   No results for input(s): "LIPASE", "AMYLASE" in the last 168 hours. CBC: Recent Labs  Lab 11/29/21 0814 12/02/21 0511 12/03/21 1502 12/05/21 0856  WBC 7.0 6.7 6.5 6.5  NEUTROABS  --  3.2  --   --   HGB 10.5* 10.6* 9.9* 9.7*  HCT 31.5* 31.7* 30.2* 29.4*  MCV 84.0 83.2 83.9 83.5  PLT 175 183 191 177   Blood Culture    Component Value Date/Time   SDES TISSUE 08/28/2021 [place] TISSUE 08/28/2021 0917    CULT  08/28/2021 0917    MODERATE ENTEROCOCCUS FAECIUM VANCOMYCIN RESISTANT ENTEROCOCCUS ISOLATED NO ANAEROBES ISOLATED Sent to [facility] for further susceptibility testing. Performed at [facility], [place] [address] [place], [place] [address]    REPTSTATUS 09/02/2021 FINAL 08/28/2021 1601    Cardiac Enzymes: No results for input(s): "CKTOTAL", "CKMB", "CKMBINDEX", "TROPONINI" in the last 168 hours. CBG: Recent Labs  Lab 12/04/21 1109 12/04/21 1621 12/04/21 2101 12/05/21 0611 12/05/21 0718  GLUCAP 169* 122* 151* 143* 150*   Iron Studies: No results for input(s): "IRON", "TIBC", "TRANSFERRIN", "FERRITIN" in the last 72 hours. Lab Results  Component Value Date   INR 1.1 12/28/2019   INR 1.2 12/17/2018   INR 0.99 04/07/2017   Studies/Results: No results found.  Medications:  methocarbamol (ROBAXIN) IV      (feeding supplement) PROSource Plus  30 mL Oral BID BM   alosetron  1 mg Oral BID   amitriptyline  25 mg Oral QHS   amLODipine  10 mg  Oral Q24H   atorvastatin  40 mg Oral q1800   carvedilol  6.25 mg Oral BID   Chlorhexidine Gluconate Cloth  6 each Topical Q0600   cyclobenzaprine  10 mg Oral TID   docusate sodium  200 mg Oral BID   DULoxetine  60 mg Oral Daily   famotidine  20 mg Oral Daily   furosemide  80 mg Oral Daily   heparin  5,000 Units Subcutaneous Q8H   hydrALAZINE  25 mg Oral TID   insulin aspart  0-9 Units Subcutaneous TID WC   insulin aspart  4 Units Subcutaneous TID WC   insulin glargine-yfgn  7 Units Subcutaneous QHS   multivitamin  1 tablet Oral QHS   pantoprazole  80 mg Oral Daily   polyethylene glycol  17 g Oral Daily   sevelamer carbonate  2,400 mg Oral TID WC   sodium zirconium cyclosilicate  10 g Oral Once per day on Sun Tue Thu Sat    Dialysis Orders: MWF at [facility], 400/500, EDW 90kg, 2K/2Ca, LUE AVF - heparin 4000 unit IV bolus - No ESA  Assessment/Plan: Recurrent L hip osteomyelitis: S/p 08/10/21 surgery (removal  hip IMN which was placed  12/2019) with persistent VRE infection. Back to OR 08/28/21, 08/31/21 for L hip debridement and wound vac placement (now removed). Finished 6 week course of IV Daptomycin on 10/12/21. Management per primary team.  Chronic L hip/stump pain: She does have fractures of L femoral neck (where hardware was removed) as well as distal femur Fx. Pain control per primary.  ESRD: Continue HD on usual MWF - On HD. Hyperkalemia: Recurrent issues, on 2K bath + furosemide. Lokelma 10g added on non-HD days -> now prn. K+ today 4.4. AVF dysfunction: S/p branch ligation in OR 09/19/2021 per Dr. Virl Cagey. AVF working well now. HTN/volume: BP controlled on amlodipine/hydralazine/Coreg. Edema  still present  - reminded on fluid restrictions (exceeds). Getting ~ 4-4.5L off q HD. Anemia of ESRD: Hgb 9.7, not on ESA. Ordering iron studies in AM. Secondary HPTH: CorrCa slightly high, not on VDRA. Phos variable. Continue Renvela as binder. Nutrition: Alb low, continue protein supplements. DM2: Insulin per primary Dispo: SNF previously recommended, now she would like to go home on discharge even though HD unit has been clear that they are prohibited by company policy to assist patient out of her car and into the facility. Patient is aware of this - says she is going to work with transfer with PT.  Asks about home HD but they would NOT be able to train her since she is in the hospital. Dialyzed in recliner on 9/4 but signed off early due to pain, has declined to come in a recliner since then.  Tobie Poet, NP [facility] 12/05/2021,9:35 AM  LOS: 101 days

## 2021-12-05 NOTE — Progress Notes (Signed)
[facility]                                                                              [name], is a 56 y.o. 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  Patient fell while trying to transfer from wheelchair to recliner on 5/19 and was found to have distal femoral fracture, deemed nonoperative.  She was admitted 6/3-6/23 and underwent partial resection of femur for osteomyelitis of infected soft tissue and rearrangement of bony fragments, was on antibiotics as guided by ID with cultures growing Staph aureus.  Patient presented secondary to copious left hip bleeding and found to have evidence of subacute osteomyelitis and abscess of left femur.  Orthopedics/ID was consulted.  Patient was started on empiric antibiotics and underwent multiple I&D's by Dr. [name] on 0/08, had application of Kerecis micro powder 7/1 with local tissue rearrangement.  She was also seen by vascular surgery, did with fistula branch ligation, completed 6 weeks course of daptomycin on 10/09/2021.  Discharge pending due to inability to tolerate HD in a chair, complicated by patient's refusal/inability to sit in a chair secondary to pain.  She tolerated HD in chair on 9/4.  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 [name] 1 week after discharge -Left knee reimaging on 10/2 showed the fracture line still visible but callus formation  -No acute issues, continue pain control, PT OT, encourage mobility   Active Problems:   ESRD (end stage renal disease) on dialysis ([place]), MWF -Continue HD per nephrology, still on Lasix -Has to do HD chair for outpatient hemodialysis, complicated by patient's refusal/inability to sit in a chair    Type 2 diabetes mellitus with hyperlipidemia ([place]) -Continue SSI, Lantus 7 units nightly, meal coverage 4 units 3 times daily AC -CBG stable   History of left intertrochanteric fracture -Previously managed with IM nail placement in 2021, hardware removed secondary to #1  Hyponatremia, hyperkalemia, hypomagnesemia -Electrolytes to be managed with HD and Lokelma as needed for hyperkalemia  ABLA superimposed on anemia of chronic disease -H&H stable    History of CVA (cerebrovascular accident) -Continue Lipitor  Primary hypertension -Continue amlodipine, hydralazine, Coreg  Right knee pain -Likely osteoarthritis, x-ray from 9/19 negative for any fracture   GERD -Continue Pepcid, Protonix  Anxiety, depression -Continue amitriptyline, Cymbalta   Code Status: Full code DVT Prophylaxis:  Place TED hose Start: 09/06/21 1507 SCDs Start: 08/31/21  1058 heparin injection 5,000 Units Start: 08/26/21 1615   Level of Care: Level of care: Med-Surg Family Communication: Updated patieny  Disposition Plan:      Remains inpatient appropriate: Difficult placement due to inability to sit in the chair for HD   Procedures:  Hemodialysis  Consultants:   Nephrology Orthopedics Discussed surgery  Antimicrobials:   Anti-infectives (From admission, onward)    Start     Dose/Rate Route Frequency Ordered Stop   10/02/21 2000  DAPTOmycin (CUBICIN) 900 mg in sodium chloride 0.9 % IVPB  Status:  Discontinued        10 mg/kg  92.4 kg 136 mL/hr over 30  Minutes Intravenous Once per day on Mon Wed Fri 10/01/21 1350 10/12/21 1146   09/30/21 2000  DAPTOmycin (CUBICIN) 900 mg in sodium chloride 0.9 % IVPB  Status:  Discontinued        10 mg/kg  92.4 kg 136 mL/hr over 30 Minutes Intravenous Daily 09/28/21 0919 10/01/21 1350   09/28/21 2000  DAPTOmycin (CUBICIN) 900 mg in sodium chloride 0.9 % IVPB        900 mg 136 mL/hr over 30 Minutes Intravenous  Once 09/28/21 0919 09/28/21 2146   09/02/21 2000  DAPTOmycin (CUBICIN) 900 mg in sodium chloride 0.9 % IVPB  Status:  Discontinued        10 mg/kg  92.4 kg 136 mL/hr over 30 Minutes Intravenous Once per day on Mon Wed Fri 09/02/21 0836 09/28/21 0919   08/31/21 0745  ceFAZolin (ANCEF) IVPB 2g/100 mL premix        2 g 200 mL/hr over 30 Minutes Intravenous To Short Stay 08/31/21 0730 08/31/21 0918   08/30/21 0600  ceFAZolin (ANCEF) IVPB 2g/100 mL premix  Status:  Discontinued        2 g 200 mL/hr over 30 Minutes Intravenous To Short Stay 08/30/21 0123 08/31/21 0600   08/29/21 2000  DAPTOmycin (CUBICIN) 900 mg in sodium chloride 0.9 % IVPB  Status:  Discontinued        10 mg/kg  92.4 kg 136 mL/hr over 30 Minutes Intravenous Every 48 hours 08/29/21 1627 09/02/21 0836   08/28/21 1800  ceFAZolin (ANCEF) IVPB 2g/100 mL premix  Status:  Discontinued        2 g 200 mL/hr over 30 Minutes Intravenous Every M-W-F (1800) 08/27/21 0846 08/29/21 1620   08/28/21 1200  ceFAZolin (ANCEF) IVPB 1 g/50 mL premix  Status:  Discontinued        1 g 100 mL/hr over 30 Minutes Intravenous Every M-W-F (Hemodialysis) 08/26/21 1954 08/27/21 0846   08/28/21 0600  ceFAZolin (ANCEF) IVPB 2g/100 mL premix        2 g 200 mL/hr over 30 Minutes Intravenous On call to O.R. 08/27/21 1150 08/28/21 0909   08/27/21 1800  ceFAZolin (ANCEF) IVPB 1 g/50 mL premix        1 g 100 mL/hr over 30 Minutes Intravenous  Once 08/26/21 1954 08/27/21 2048   08/26/21 1500  ceFEPIme (MAXIPIME) 1 g in sodium chloride 0.9 % 100 mL IVPB  Status:   Discontinued        1 g 200 mL/hr over 30 Minutes Intravenous Every 24 hours 08/26/21 1452 08/26/21 1714          Medications  (feeding supplement) PROSource Plus  30 mL Oral BID BM   alosetron  1 mg Oral BID   amitriptyline  25 mg Oral QHS   amLODipine  10 mg Oral Q24H  atorvastatin  40 mg Oral q1800   carvedilol  6.25 mg Oral BID   Chlorhexidine Gluconate Cloth  6 each Topical Q0600   cyclobenzaprine  10 mg Oral TID   docusate sodium  200 mg Oral BID   DULoxetine  60 mg Oral Daily   famotidine  20 mg Oral Daily   furosemide  80 mg Oral Daily   heparin  5,000 Units Subcutaneous Q8H   hydrALAZINE  25 mg Oral TID   insulin aspart  0-9 Units Subcutaneous TID WC   insulin aspart  4 Units Subcutaneous TID WC   insulin glargine-yfgn  7 Units Subcutaneous QHS   multivitamin  1 tablet Oral QHS   pantoprazole  80 mg Oral Daily   polyethylene glycol  17 g Oral Daily   sevelamer carbonate  2,400 mg Oral TID WC   sodium zirconium cyclosilicate  10 g Oral Once per day on Sun Tue Thu Sat      Subjective:   [name] was seen and examined today.   Seen in HD, no acute issues.    Objective:   Vitals:   12/05/21 1200 12/05/21 1230 12/05/21 1346 12/05/21 1354  BP: (!) 116/54 120/62 (!) 123/46   Pulse: 64     Resp: (!) 7     Temp:   98.9 F (37.2 C)   TempSrc:      SpO2: 98%     Weight:    92.5 kg  Height:        Intake/Output Summary (Last 24 hours) at 12/05/2021 1402 Last data filed at 12/05/2021 1251 Gross per 24 hour  Intake 940 ml  Output 4.5 ml  Net 935.5 ml     Wt Readings from Last 3 Encounters:  12/05/21 92.5 kg  08/23/21 96.5 kg  08/07/21 101.1 kg   Physical Exam General: Alert and oriented x 3, NAD Cardiovascular: S1 S2 clear, RRR.  Respiratory: CTAB, no wheezing Gastrointestinal: Soft, nontender, nondistended, NBS Ext: left BKA Psych: Normal affect     Data Reviewed:  I have personally reviewed following labs    CBC Lab Results   Component Value Date   WBC 6.5 12/05/2021   RBC 3.52 (L) 12/05/2021   HGB 9.7 (L) 12/05/2021   HCT 29.4 (L) 12/05/2021   MCV 83.5 12/05/2021   MCH 27.6 12/05/2021   PLT 177 12/05/2021   MCHC 33.0 12/05/2021   RDW 15.3 12/05/2021   LYMPHSABS 2.2 12/02/2021   MONOABS 1.0 12/02/2021   EOSABS 0.2 12/02/2021   BASOSABS 0.1 70/17/7939     Last metabolic panel Lab Results  Component Value Date   NA 125 (L) 12/05/2021   K 4.4 12/05/2021   CL 87 (L) 12/05/2021   CO2 25 12/05/2021   BUN 88 (H) 12/05/2021   CREATININE 6.31 (H) 12/05/2021   GLUCOSE 180 (H) 12/05/2021   GFRNONAA 7 (L) 12/05/2021   GFRAA 13 (L) 12/17/2018   CALCIUM 8.9 12/05/2021   PHOS 6.3 (H) 12/05/2021   PROT 7.6 12/02/2021   ALBUMIN 3.1 (L) 12/05/2021   LABGLOB 3.1 10/29/2018   AGRATIO 1.4 10/29/2018   BILITOT 0.6 12/02/2021   ALKPHOS 105 12/02/2021   AST 16 12/02/2021   ALT 15 12/02/2021   ANIONGAP 13 12/05/2021    CBG (last 3)  Recent Labs    12/04/21 2101 12/05/21 0611 12/05/21 0718  GLUCAP 151* 143* 150*      Coagulation Profile: No results for input(s): "INR", "PROTIME" in the last 168 hours.  Radiology Studies: I have personally reviewed the imaging studies  No results found.     Estill Cotta M.D. [facility] 12/05/2021, 2:02 PM  Available via Epic secure chat 7am-7pm After 7 pm, please refer to night coverage provider listed on amion.

## 2021-12-05 NOTE — Progress Notes (Addendum)
Received patient in bed to unit.  Alert and oriented.  Informed consent signed and in chart.   Treatment initiated: 0845 Treatment completed: 4360  Patient tolerated well.  Transported back to the room  Alert, without acute distress.  Hand-off given to patient's nurse.   Access used: Avfistula Access issues: none  Total UF removed: 4.5L Medication(s) given: none Post HD VS: 98.9,123/46,12,69,99% Post HD weight: 92.5kg   [facility]

## 2021-12-05 NOTE — Progress Notes (Signed)
PT Cancellation Note  Patient Details Name: [name] MRN: [id number] DOB: [birth year]   Cancelled Treatment:    Reason Eval/Treat Not Completed: Patient at procedure or test/unavailable  Patient in HD. Will attempt to see this pm as schedule permits.    [place]  Office [phone]  [name] 12/05/2021, 12:09 PM

## 2021-12-05 NOTE — Progress Notes (Signed)
OT Cancellation Note  Patient Details Name: [name] MRN: [id number] DOB: 1965/11/18   Cancelled Treatment:    Reason Eval/Treat Not Completed: Patient at procedure or test/ unavailable (Pt currently in HD.)  [name] 12/05/2021, 11:19 AM Cleta Alberts, OTR/L [place] Office: [phone]

## 2021-12-05 NOTE — Progress Notes (Signed)
Pt off unit to hemodialysis. 

## 2021-12-06 DIAGNOSIS — M86252 Subacute osteomyelitis, left femur: Secondary | ICD-10-CM | POA: Diagnosis not present

## 2021-12-06 DIAGNOSIS — N186 End stage renal disease: Secondary | ICD-10-CM | POA: Diagnosis not present

## 2021-12-06 DIAGNOSIS — F419 Anxiety disorder, unspecified: Secondary | ICD-10-CM | POA: Diagnosis not present

## 2021-12-06 DIAGNOSIS — K2100 Gastro-esophageal reflux disease with esophagitis, without bleeding: Secondary | ICD-10-CM | POA: Diagnosis not present

## 2021-12-06 LAB — CBC WITH DIFFERENTIAL/PLATELET
Abs Immature Granulocytes: 0.03 10*3/uL (ref 0.00–0.07)
Basophils Absolute: 0.1 10*3/uL (ref 0.0–0.1)
Basophils Relative: 1 %
Eosinophils Absolute: 0.2 10*3/uL (ref 0.0–0.5)
Eosinophils Relative: 3 %
HCT: 31.4 % — ABNORMAL LOW (ref 36.0–46.0)
Hemoglobin: 10.5 g/dL — ABNORMAL LOW (ref 12.0–15.0)
Immature Granulocytes: 1 %
Lymphocytes Relative: 37 %
Lymphs Abs: 2.1 10*3/uL (ref 0.7–4.0)
MCH: 28 pg (ref 26.0–34.0)
MCHC: 33.4 g/dL (ref 30.0–36.0)
MCV: 83.7 fL (ref 80.0–100.0)
Monocytes Absolute: 1 10*3/uL (ref 0.1–1.0)
Monocytes Relative: 18 %
Neutro Abs: 2.3 10*3/uL (ref 1.7–7.7)
Neutrophils Relative %: 40 %
Platelets: 165 10*3/uL (ref 150–400)
RBC: 3.75 MIL/uL — ABNORMAL LOW (ref 3.87–5.11)
RDW: 15.5 % (ref 11.5–15.5)
WBC: 5.7 10*3/uL (ref 4.0–10.5)
nRBC: 0 % (ref 0.0–0.2)

## 2021-12-06 LAB — IRON AND TIBC
Iron: 110 ug/dL (ref 28–170)
Saturation Ratios: 54 % — ABNORMAL HIGH (ref 10.4–31.8)
TIBC: 204 ug/dL — ABNORMAL LOW (ref 250–450)
UIBC: 94 ug/dL

## 2021-12-06 LAB — FERRITIN: Ferritin: 374 ng/mL — ABNORMAL HIGH (ref 11–307)

## 2021-12-06 LAB — GLUCOSE, CAPILLARY
Glucose-Capillary: 145 mg/dL — ABNORMAL HIGH (ref 70–99)
Glucose-Capillary: 210 mg/dL — ABNORMAL HIGH (ref 70–99)
Glucose-Capillary: 81 mg/dL (ref 70–99)
Glucose-Capillary: 186 mg/dL — ABNORMAL HIGH (ref 70–99)

## 2021-12-06 LAB — RENAL FUNCTION PANEL
Albumin: 3.1 g/dL — ABNORMAL LOW (ref 3.5–5.0)
Anion gap: 10 (ref 5–15)
BUN: 47 mg/dL — ABNORMAL HIGH (ref 6–20)
CO2: 31 mmol/L (ref 22–32)
Calcium: 9.7 mg/dL (ref 8.9–10.3)
Chloride: 91 mmol/L — ABNORMAL LOW (ref 98–111)
Creatinine, Ser: 4.32 mg/dL — ABNORMAL HIGH (ref 0.44–1.00)
GFR, Estimated: 12 mL/min — ABNORMAL LOW (ref >60)
Glucose, Bld: 146 mg/dL — ABNORMAL HIGH (ref 70–99)
Phosphorus: 4.7 mg/dL — ABNORMAL HIGH (ref 2.5–4.6)
Potassium: 4.1 mmol/L (ref 3.5–5.1)
Sodium: 132 mmol/L — ABNORMAL LOW (ref 135–145)

## 2021-12-06 MED ORDER — CHLORHEXIDINE GLUCONATE CLOTH 2 % EX PADS: 6.0000 | MEDICATED_PAD | Freq: Every day | CUTANEOUS | Status: DC

## 2021-12-06 NOTE — Progress Notes (Signed)
Spoke to [employer] with [facility] yesterday afternoon. Pt has been denied for stretcher HD at a local [place] clinic. Maudie Mercury states that no other clinic will approve either at this time. There are no local out-pt HD clinics that can accommodate pt until pt able to receive HD in a chair.   Melven Sartorius Renal Navigator [phone]

## 2021-12-06 NOTE — Progress Notes (Signed)
[facility]                                                                              Albertha Beattie, is a 56 y.o. female, DOB - Nov 13, 1965, [id number] Admit date - 08/26/2021    Outpatient Primary MD for the patient is [name], MD  LOS - 102  days  Chief Complaint  Patient presents with   Wound Infection       Brief summary   Patient is a 56 year old female with HTN, HLD, IDDM, ESRD on HD MWF, left BKA 2021, 01/12/2020 for chronic calcaneus osteomyelitis, bipolar disorder presented with fall and wound infection.  Patient fell while trying to transfer from wheelchair to recliner on 5/19 and was found to have distal femoral fracture, deemed nonoperative.  She was admitted 6/3-6/23 and underwent partial resection of femur for osteomyelitis of infected soft tissue and rearrangement of bony fragments, was on antibiotics as guided by ID with cultures growing Staph aureus.  Patient presented secondary to copious left hip bleeding and found to have evidence of subacute osteomyelitis and abscess of left femur.  Orthopedics/ID was consulted.  Patient was started on empiric antibiotics and underwent multiple I&D's by Dr. [name] on 9/48, had application of Kerecis micro powder 7/1 with local tissue rearrangement.  She was also seen by vascular surgery, did with fistula branch ligation, completed 6 weeks course of daptomycin on 10/09/2021.  Discharge pending due to inability to tolerate HD in a chair, complicated by patient's refusal/inability to sit in a chair secondary to pain.  She tolerated HD in chair on 9/4.  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 [name] 1 week after discharge -Left knee reimaging on 10/2 showed the fracture line still visible but callus formation  -No new medical issues - TOC and renal navigator assisting with placement issues, has been denied for stretcher HD at local [facility] clinic and no other clinic has approved either at this time   Active Problems:   ESRD (end stage renal disease) on dialysis ([facility]), MWF -Continue HD per nephrology, still on Lasix -Has to do HD chair for outpatient hemodialysis, complicated by patient's refusal/inability to sit in a chair    Type 2 diabetes mellitus with hyperlipidemia ([place]) -Continue SSI, Lantus 7 units nightly, meal coverage 4 units 3 times daily AC  Recent Labs    12/05/21 0718 12/05/21 1427 12/05/21 1705 12/05/21 2107 12/06/21 0726 12/06/21 1121  GLUCAP 150* 136* 215* 138* 145* 210*      History of left intertrochanteric fracture -Previously managed with IM nail placement in 2021, hardware removed secondary to #1  Hyponatremia, hyperkalemia, hypomagnesemia -Electrolytes to be managed with HD and Lokelma as needed for hyperkalemia  ABLA superimposed on anemia of chronic disease -Hemoglobin stable, 10.5    History of CVA (cerebrovascular  accident) -Continue Lipitor  Primary hypertension -Continue amlodipine, hydralazine, Coreg  Right knee pain -Likely osteoarthritis, x-ray from 9/19 negative for any fracture   GERD -Continue Pepcid, Protonix  Anxiety, depression -Continue amitriptyline, Cymbalta   Code Status: Full code DVT Prophylaxis:  Place TED hose Start: 09/06/21 1507 SCDs Start: 08/31/21 1058 heparin injection 5,000 Units Start: 08/26/21 1615   Level of Care: Level of care: Med-Surg Family Communication: Updated patieny  Disposition Plan:      Remains inpatient appropriate: Difficult placement due to inability to sit in the chair for HD   Procedures:  Hemodialysis  Consultants:    Nephrology Orthopedics Discussed surgery  Antimicrobials:   Anti-infectives (From admission, onward)    Start     Dose/Rate Route Frequency Ordered Stop   10/02/21 2000  DAPTOmycin (CUBICIN) 900 mg in sodium chloride 0.9 % IVPB  Status:  Discontinued        10 mg/kg  92.4 kg 136 mL/hr over 30 Minutes Intravenous Once per day on Mon Wed Fri 10/01/21 1350 10/12/21 1146   09/30/21 2000  DAPTOmycin (CUBICIN) 900 mg in sodium chloride 0.9 % IVPB  Status:  Discontinued        10 mg/kg  92.4 kg 136 mL/hr over 30 Minutes Intravenous Daily 09/28/21 0919 10/01/21 1350   09/28/21 2000  DAPTOmycin (CUBICIN) 900 mg in sodium chloride 0.9 % IVPB        900 mg 136 mL/hr over 30 Minutes Intravenous  Once 09/28/21 0919 09/28/21 2146   09/02/21 2000  DAPTOmycin (CUBICIN) 900 mg in sodium chloride 0.9 % IVPB  Status:  Discontinued        10 mg/kg  92.4 kg 136 mL/hr over 30 Minutes Intravenous Once per day on Mon Wed Fri 09/02/21 0836 09/28/21 0919   08/31/21 0745  ceFAZolin (ANCEF) IVPB 2g/100 mL premix        2 g 200 mL/hr over 30 Minutes Intravenous To Short Stay 08/31/21 0730 08/31/21 0918   08/30/21 0600  ceFAZolin (ANCEF) IVPB 2g/100 mL premix  Status:  Discontinued        2 g 200 mL/hr over 30 Minutes Intravenous To Short Stay 08/30/21 0123 08/31/21 0600   08/29/21 2000  DAPTOmycin (CUBICIN) 900 mg in sodium chloride 0.9 % IVPB  Status:  Discontinued        10 mg/kg  92.4 kg 136 mL/hr over 30 Minutes Intravenous Every 48 hours 08/29/21 1627 09/02/21 0836   08/28/21 1800  ceFAZolin (ANCEF) IVPB 2g/100 mL premix  Status:  Discontinued        2 g 200 mL/hr over 30 Minutes Intravenous Every M-W-F (1800) 08/27/21 0846 08/29/21 1620   08/28/21 1200  ceFAZolin (ANCEF) IVPB 1 g/50 mL premix  Status:  Discontinued        1 g 100 mL/hr over 30 Minutes Intravenous Every M-W-F (Hemodialysis) 08/26/21 1954 08/27/21 0846   08/28/21 0600  ceFAZolin (ANCEF) IVPB 2g/100 mL premix        2 g 200 mL/hr  over 30 Minutes Intravenous On call to O.R. 08/27/21 1150 08/28/21 0909   08/27/21 1800  ceFAZolin (ANCEF) IVPB 1 g/50 mL premix        1 g 100 mL/hr over 30 Minutes Intravenous  Once 08/26/21 1954 08/27/21 2048   08/26/21 1500  ceFEPIme (MAXIPIME) 1 g in sodium chloride 0.9 % 100 mL IVPB  Status:  Discontinued        1 g 200 mL/hr over 30 Minutes Intravenous Every  24 hours 08/26/21 1452 08/26/21 1714          Medications  (feeding supplement) PROSource Plus  30 mL Oral BID BM   alosetron  1 mg Oral BID   amitriptyline  25 mg Oral QHS   amLODipine  10 mg Oral Q24H   atorvastatin  40 mg Oral q1800   carvedilol  6.25 mg Oral BID   Chlorhexidine Gluconate Cloth  6 each Topical Q0600   cyclobenzaprine  10 mg Oral TID   docusate sodium  200 mg Oral BID   DULoxetine  60 mg Oral Daily   famotidine  20 mg Oral Daily   furosemide  80 mg Oral Daily   heparin  5,000 Units Subcutaneous Q8H   hydrALAZINE  25 mg Oral TID   insulin aspart  0-9 Units Subcutaneous TID WC   insulin aspart  4 Units Subcutaneous TID WC   insulin glargine-yfgn  7 Units Subcutaneous QHS   multivitamin  1 tablet Oral QHS   pantoprazole  80 mg Oral Daily   polyethylene glycol  17 g Oral Daily   sevelamer carbonate  2,400 mg Oral TID WC   sodium zirconium cyclosilicate  10 g Oral Once per day on Sun Tue Thu Sat      Subjective:   Thera Basden was seen and examined today.  No acute medical issues, states that she cannot sit in the chair for 4 to 6 hours for HD.  Objective:   Vitals:   12/05/21 1706 12/05/21 2028 12/06/21 0613 12/06/21 0953  BP: (!) 134/55 (!) 107/53 (!) 130/50 (!) 145/64  Pulse: 63 66 63 62  Resp: 18 18 18 18   Temp: 97.6 F (36.4 C) 98.4 F (36.9 C) 98.3 F (36.8 C) 98.4 F (36.9 C)  TempSrc: Oral Oral Oral Oral  SpO2: 97% 98% 97% 100%  Weight:      Height:        Intake/Output Summary (Last 24 hours) at 12/06/2021 1238 Last data filed at 12/06/2021 0800 Gross per 24 hour   Intake 1550 ml  Output 354.5 ml  Net 1195.5 ml     Wt Readings from Last 3 Encounters:  12/05/21 92.5 kg  08/23/21 96.5 kg  08/07/21 101.1 kg    Physical Exam General: Alert and oriented x 3, NAD Cardiovascular: S1 S2 clear, RRR.  Respiratory: CTAB, no wheezing, rales or rhonchi Gastrointestinal: Soft, nontender, nondistended, NBS Ext: left BKA    Data Reviewed:  I have personally reviewed following labs    CBC Lab Results  Component Value Date   WBC 5.7 12/06/2021   RBC 3.75 (L) 12/06/2021   HGB 10.5 (L) 12/06/2021   HCT 31.4 (L) 12/06/2021   MCV 83.7 12/06/2021   MCH 28.0 12/06/2021   PLT 165 12/06/2021   MCHC 33.4 12/06/2021   RDW 15.5 12/06/2021   LYMPHSABS 2.1 12/06/2021   MONOABS 1.0 12/06/2021   EOSABS 0.2 12/06/2021   BASOSABS 0.1 51/04/5850     Last metabolic panel Lab Results  Component Value Date   NA 132 (L) 12/06/2021   K 4.1 12/06/2021   CL 91 (L) 12/06/2021   CO2 31 12/06/2021   BUN 47 (H) 12/06/2021   CREATININE 4.32 (H) 12/06/2021   GLUCOSE 146 (H) 12/06/2021   GFRNONAA 12 (L) 12/06/2021   GFRAA 13 (L) 12/17/2018   CALCIUM 9.7 12/06/2021   PHOS 4.7 (H) 12/06/2021   PROT 7.6 12/02/2021   ALBUMIN 3.1 (L) 12/06/2021   LABGLOB 3.1 10/29/2018  AGRATIO 1.4 10/29/2018   BILITOT 0.6 12/02/2021   ALKPHOS 105 12/02/2021   AST 16 12/02/2021   ALT 15 12/02/2021   ANIONGAP 10 12/06/2021    CBG (last 3)  Recent Labs    12/05/21 2107 12/06/21 0726 12/06/21 1121  GLUCAP 138* 145* 210*      Coagulation Profile: No results for input(s): "INR", "PROTIME" in the last 168 hours.   Radiology Studies: I have personally reviewed the imaging studies  No results found.     Estill Cotta M.D. [facility] 12/06/2021, 12:38 PM  Available via Epic secure chat 7am-7pm After 7 pm, please refer to night coverage provider listed on amion.

## 2021-12-06 NOTE — Progress Notes (Addendum)
[facility] Progress Note   Subjective:   Patient seen and examined at bedside.  No new complaints.  Reports she is not practicing transfer from bed to chair d/t "uncontrolled pain and swelling."  Denies CP, SOB, abdominal pain and n/v/d. States she is following fluid restrictions and low salt diet.  When asked about salt shaker on table reports "I only use a little on grits in the morning."  Objective Vitals:   12/05/21 1706 12/05/21 2028 12/06/21 0613 12/06/21 0953  BP: (!) 134/55 (!) 107/53 (!) 130/50 (!) 145/64  Pulse: 63 66 63 62  Resp: 18 18 18 18   Temp: 97.6 F (36.4 C) 98.4 F (36.9 C) 98.3 F (36.8 C) 98.4 F (36.9 C)  TempSrc: Oral Oral Oral Oral  SpO2: 97% 98% 97% 100%  Weight:      Height:       Physical Exam General:WDWN female in NAD Heart:RRR, no mrg Lungs:CTAB,nml WOB on RA Abdomen:soft, NTND Extremities:trace LE edema, L BKA Dialysis Access: LU AVF +thrill   Filed Weights   12/03/21 1913 12/05/21 0815 12/05/21 1354  Weight: 92 kg 96.2 kg 92.5 kg    Intake/Output Summary (Last 24 hours) at 12/06/2021 1535 Last data filed at 12/06/2021 1200 Gross per 24 hour  Intake 1555 ml  Output 350 ml  Net 1205 ml    Additional Objective Labs: Basic Metabolic Panel: Recent Labs  Lab 12/04/21 0433 12/05/21 0856 12/06/21 0343  NA 129* 125* 132*  K 4.2 4.4 4.1  CL 90* 87* 91*  CO2 27 25 31   GLUCOSE 134* 180* 146*  BUN 58* 88* 47*  CREATININE 5.06* 6.31* 4.32*  CALCIUM 9.0 8.9 9.7  PHOS 4.9* 6.3* 4.7*   Liver Function Tests: Recent Labs  Lab 12/02/21 0511 12/03/21 1502 12/04/21 0433 12/05/21 0856 12/06/21 0343  AST 16  --   --   --   --   ALT 15  --   --   --   --   ALKPHOS 105  --   --   --   --   BILITOT 0.6  --   --   --   --   PROT 7.6  --   --   --   --   ALBUMIN 3.3*   < > 3.1* 3.1* 3.1*   < > = values in this interval not displayed.    CBC: Recent Labs  Lab 12/02/21 0511 12/03/21 1502 12/05/21 0856 12/06/21 0343   WBC 6.7 6.5 6.5 5.7  NEUTROABS 3.2  --   --  2.3  HGB 10.6* 9.9* 9.7* 10.5*  HCT 31.7* 30.2* 29.4* 31.4*  MCV 83.2 83.9 83.5 83.7  PLT 183 191 177 165   CBG: Recent Labs  Lab 12/05/21 1427 12/05/21 1705 12/05/21 2107 12/06/21 0726 12/06/21 1121  GLUCAP 136* 215* 138* 145* 210*   Iron Studies:  Recent Labs    12/06/21 0343  IRON 110  TIBC 204*  FERRITIN 374*   Lab Results  Component Value Date   INR 1.1 12/28/2019   INR 1.2 12/17/2018   INR 0.99 04/07/2017   Studies/Results: No results found.  Medications:  methocarbamol (ROBAXIN) IV      (feeding supplement) PROSource Plus  30 mL Oral BID BM   alosetron  1 mg Oral BID   amitriptyline  25 mg Oral QHS   amLODipine  10 mg Oral Q24H   atorvastatin  40 mg Oral q1800   carvedilol  6.25 mg Oral  BID   Chlorhexidine Gluconate Cloth  6 each Topical Q0600   cyclobenzaprine  10 mg Oral TID   docusate sodium  200 mg Oral BID   DULoxetine  60 mg Oral Daily   famotidine  20 mg Oral Daily   furosemide  80 mg Oral Daily   heparin  5,000 Units Subcutaneous Q8H   hydrALAZINE  25 mg Oral TID   insulin aspart  0-9 Units Subcutaneous TID WC   insulin aspart  4 Units Subcutaneous TID WC   insulin glargine-yfgn  7 Units Subcutaneous QHS   multivitamin  1 tablet Oral QHS   pantoprazole  80 mg Oral Daily   polyethylene glycol  17 g Oral Daily   sevelamer carbonate  2,400 mg Oral TID WC   sodium zirconium cyclosilicate  10 g Oral Once per day on Sun Tue Thu Sat    Dialysis Orders: MWF at [place], 400/500, EDW 90kg, 2K/2Ca, LUE AVF - heparin 4000 unit IV bolus - No ESA   Assessment/Plan: Recurrent L hip osteomyelitis: S/p 08/10/21 surgery (removal hip IMN which was placed  12/2019) with persistent VRE infection. Back to OR 08/28/21, 08/31/21 for L hip debridement and wound vac placement (now removed). Finished 6 week course of IV Daptomycin on 10/12/21. Management per primary team.  Chronic L hip/stump pain: She does  have fractures of L femoral neck (where hardware was removed) as well as distal femur Fx. Pain control per primary.  ESRD: Continue HD on usual MWF - off schedule this week.  HD tomorrow then resume regular schedule next week. Hyperkalemia: Recurrent issues, on 2K bath + furosemide. Lokelma 10g added on non-HD days -> now prn. K+ today 4.1. AVF dysfunction: S/p branch ligation in OR 09/19/2021 per Dr. Virl Cagey. AVF working well now. HTN/volume: BP controlled on amlodipine/hydralazine/Coreg. Ongoing edema and large gains.  Reinforced fluid and sodium restrictions. Getting ~ 4-4.5L off q HD.  Not quite to EDW.  Anemia of ESRD: Hgb 10.5, tsat 54%.  No need for iron or ESA at present. Secondary HPTH: CorrCa slightly high, not on VDRA. Phos variable. Continue Renvela as binder. Nutrition: Alb low, continue protein supplements. DM2: Insulin per primary Dispo: SNF previously recommended, now she would like to go home on discharge even though HD unit has been clear that they are prohibited by company policy to assist patient out of her car and into the facility. Patient is aware of this - says she is going to work with transfer with PT.  Asks about home HD but they would NOT be able to train her since she is in the hospital. Dialyzed in recliner on 9/4 but signed off early due to pain, has declined to come in a recliner since then.  Per Renal Navigator notes there are no clinics that will accept patient unless she will have HD in chair. Will try for HD in chair again tomorrow.   Jen Mow, PA-C [facility] 12/06/2021,3:35 PM  LOS: 102 days

## 2021-12-07 DIAGNOSIS — K2100 Gastro-esophageal reflux disease with esophagitis, without bleeding: Secondary | ICD-10-CM | POA: Diagnosis not present

## 2021-12-07 DIAGNOSIS — N186 End stage renal disease: Secondary | ICD-10-CM | POA: Diagnosis not present

## 2021-12-07 DIAGNOSIS — F419 Anxiety disorder, unspecified: Secondary | ICD-10-CM | POA: Diagnosis not present

## 2021-12-07 DIAGNOSIS — M86252 Subacute osteomyelitis, left femur: Secondary | ICD-10-CM | POA: Diagnosis not present

## 2021-12-07 LAB — CBC
HCT: 30.2 % — ABNORMAL LOW (ref 36.0–46.0)
Hemoglobin: 9.6 g/dL — ABNORMAL LOW (ref 12.0–15.0)
MCH: 27.3 pg (ref 26.0–34.0)
MCHC: 31.8 g/dL (ref 30.0–36.0)
MCV: 85.8 fL (ref 80.0–100.0)
Platelets: 189 10*3/uL (ref 150–400)
RBC: 3.52 MIL/uL — ABNORMAL LOW (ref 3.87–5.11)
RDW: 15.6 % — ABNORMAL HIGH (ref 11.5–15.5)
WBC: 6.9 10*3/uL (ref 4.0–10.5)
nRBC: 0 % (ref 0.0–0.2)

## 2021-12-07 LAB — RENAL FUNCTION PANEL
Albumin: 3.1 g/dL — ABNORMAL LOW (ref 3.5–5.0)
Anion gap: 14 (ref 5–15)
BUN: 79 mg/dL — ABNORMAL HIGH (ref 6–20)
CO2: 26 mmol/L (ref 22–32)
Calcium: 9.2 mg/dL (ref 8.9–10.3)
Chloride: 90 mmol/L — ABNORMAL LOW (ref 98–111)
Creatinine, Ser: 5.58 mg/dL — ABNORMAL HIGH (ref 0.44–1.00)
GFR, Estimated: 9 mL/min — ABNORMAL LOW (ref >60)
Glucose, Bld: 125 mg/dL — ABNORMAL HIGH (ref 70–99)
Phosphorus: 5.7 mg/dL — ABNORMAL HIGH (ref 2.5–4.6)
Potassium: 4.5 mmol/L (ref 3.5–5.1)
Sodium: 130 mmol/L — ABNORMAL LOW (ref 135–145)

## 2021-12-07 LAB — GLUCOSE, CAPILLARY
Glucose-Capillary: 126 mg/dL — ABNORMAL HIGH (ref 70–99)
Glucose-Capillary: 169 mg/dL — ABNORMAL HIGH (ref 70–99)
Glucose-Capillary: 179 mg/dL — ABNORMAL HIGH (ref 70–99)

## 2021-12-07 MED ORDER — ANTICOAGULANT SODIUM CITRATE 4% (200MG/5ML) IV SOLN
5.0000 mL | Status: DC | PRN
  Filled 2021-12-07: qty 5

## 2021-12-07 MED ORDER — HEPARIN SODIUM (PORCINE) 1000 UNIT/ML DIALYSIS
4000.0000 [IU] | INTRAMUSCULAR | Status: DC | PRN
  Administered 2021-12-07: 4000 [IU] via INTRAVENOUS_CENTRAL
  Filled 2021-12-07 (×3): qty 4

## 2021-12-07 MED ORDER — HEPARIN SODIUM (PORCINE) 1000 UNIT/ML IJ SOLN
INTRAMUSCULAR | Status: AC
  Filled 2021-12-07: qty 1

## 2021-12-07 MED ORDER — HEPARIN SODIUM (PORCINE) 1000 UNIT/ML DIALYSIS
1000.0000 [IU] | INTRAMUSCULAR | Status: DC | PRN
  Filled 2021-12-07: qty 1

## 2021-12-07 MED ORDER — HEPARIN SODIUM (PORCINE) 1000 UNIT/ML IJ SOLN
1000.0000 [IU] | Freq: Once | INTRAMUSCULAR | Status: AC
  Administered 2021-12-07: 1000 [IU] via INTRAVENOUS

## 2021-12-07 MED ORDER — ALTEPLASE 2 MG IJ SOLR: 2.0000 mg | Freq: Once | INTRAMUSCULAR | Status: DC | PRN

## 2021-12-07 MED ORDER — PENTAFLUOROPROP-TETRAFLUOROETH EX AERO: 1.0000 | INHALATION_SPRAY | CUTANEOUS | Status: DC | PRN

## 2021-12-07 NOTE — Plan of Care (Signed)
  Problem: Nutritional: Goal: Ability to make healthy dietary choices will improve Outcome: Progressing   Problem: Pain Managment: Goal: General experience of comfort will improve Outcome: Not Progressing

## 2021-12-07 NOTE — Progress Notes (Signed)
[facility] Progress Note   Subjective:   Patient seen and examined at bedside in dialysis.  Tolerating treatment well.  No specific complaints.   Objective Vitals:   12/07/21 0930 12/07/21 1000 12/07/21 1030 12/07/21 1100  BP: (!) 126/56 (!) 123/55  (!) 121/52  Pulse: 63  63 62  Resp: (!) 8  (!) 8 (!) 6  Temp:      TempSrc:      SpO2: 98%  100% 98%  Weight:      Height:       Physical Exam General:WDWN female in NAD Heart:RRR, no mrg Lungs:CTAB, nml WOB on RA Abdomen:soft, NTND Extremities:trace LE edema, L BKA Dialysis Access: LU AVF +thrill   Filed Weights   12/05/21 0815 12/05/21 1354 12/07/21 0828  Weight: 96.2 kg 92.5 kg 94.7 kg    Intake/Output Summary (Last 24 hours) at 12/07/2021 1104 Last data filed at 12/07/2021 0547 Gross per 24 hour  Intake 720 ml  Output 200 ml  Net 520 ml    Additional Objective Labs: Basic Metabolic Panel: Recent Labs  Lab 12/05/21 0856 12/06/21 0343 12/07/21 0703  NA 125* 132* 130*  K 4.4 4.1 4.5  CL 87* 91* 90*  CO2 25 31 26   GLUCOSE 180* 146* 125*  BUN 88* 47* 79*  CREATININE 6.31* 4.32* 5.58*  CALCIUM 8.9 9.7 9.2  PHOS 6.3* 4.7* 5.7*   Liver Function Tests: Recent Labs  Lab 12/02/21 0511 12/03/21 1502 12/05/21 0856 12/06/21 0343 12/07/21 0703  AST 16  --   --   --   --   ALT 15  --   --   --   --   ALKPHOS 105  --   --   --   --   BILITOT 0.6  --   --   --   --   PROT 7.6  --   --   --   --   ALBUMIN 3.3*   < > 3.1* 3.1* 3.1*   < > = values in this interval not displayed.   CBC: Recent Labs  Lab 12/02/21 0511 12/03/21 1502 12/05/21 0856 12/06/21 0343 12/07/21 0703  WBC 6.7 6.5 6.5 5.7 6.9  NEUTROABS 3.2  --   --  2.3  --   HGB 10.6* 9.9* 9.7* 10.5* 9.6*  HCT 31.7* 30.2* 29.4* 31.4* 30.2*  MCV 83.2 83.9 83.5 83.7 85.8  PLT 183 191 177 165 189    CBG: Recent Labs  Lab 12/05/21 2107 12/06/21 0726 12/06/21 1121 12/06/21 1631 12/06/21 2039  GLUCAP 138* 145* 210* 81 186*    Iron Studies:  Recent Labs    12/06/21 0343  IRON 110  TIBC 204*  FERRITIN 374*   Medications:  anticoagulant sodium citrate     methocarbamol (ROBAXIN) IV      (feeding supplement) PROSource Plus  30 mL Oral BID BM   alosetron  1 mg Oral BID   amitriptyline  25 mg Oral QHS   amLODipine  10 mg Oral Q24H   atorvastatin  40 mg Oral q1800   carvedilol  6.25 mg Oral BID   Chlorhexidine Gluconate Cloth  6 each Topical Q0600   cyclobenzaprine  10 mg Oral TID   docusate sodium  200 mg Oral BID   DULoxetine  60 mg Oral Daily   famotidine  20 mg Oral Daily   furosemide  80 mg Oral Daily   heparin  5,000 Units Subcutaneous Q8H   hydrALAZINE  25 mg  Oral TID   insulin aspart  0-9 Units Subcutaneous TID WC   insulin aspart  4 Units Subcutaneous TID WC   insulin glargine-yfgn  7 Units Subcutaneous QHS   multivitamin  1 tablet Oral QHS   pantoprazole  80 mg Oral Daily   polyethylene glycol  17 g Oral Daily   sevelamer carbonate  2,400 mg Oral TID WC   sodium zirconium cyclosilicate  10 g Oral Once per day on Sun Tue Thu Sat    Dialysis Orders: MWF at [place], 400/500, EDW 90kg, 2K/2Ca, LUE AVF - heparin 4000 unit IV bolus - No ESA   Assessment/Plan: Recurrent L hip osteomyelitis: S/p 08/10/21 surgery (removal hip IMN which was placed  12/2019) with persistent VRE infection. Back to OR 08/28/21, 08/31/21 for L hip debridement and wound vac placement (now removed). Finished 6 week course of IV Daptomycin on 10/12/21. Management per primary team.  Chronic L hip/stump pain: She does have fractures of L femoral neck (where hardware was removed) as well as distal femur Fx. Pain control per primary.  ESRD: Continue HD on usual MWF - off schedule this week.  HD today then resume regular schedule next week. Hyperkalemia: Recurrent issues, on 2K bath + furosemide. Lokelma 10g added on non-HD days -> now prn. K+ today 4.1. AVF dysfunction: S/p branch ligation in OR 09/19/2021 per Dr.  Virl Cagey. AVF working well now. HTN/volume: BP controlled on amlodipine/hydralazine/Coreg. Ongoing edema and large gains.  Reinforced fluid and sodium restrictions. Getting ~ 4-4.5L off q HD.  Not quite to EDW.  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  No need for iron or ESA at present.  If Hgb continues to drop will start ESA.  Secondary HPTH: CorrCa slightly high, not on VDRA. Phos variable. Continue Renvela as binder. Nutrition: Alb low, continue protein supplements. DM2: Insulin per primary Dispo: SNF previously recommended, now she would like to go home on discharge even though HD unit has been clear that they are prohibited by company policy to assist patient out of her car and into the facility. Patient is aware of this - says she is going to work with transfer with PT.  Asks about home HD but they would NOT be able to train her since she is in the hospital. Dialyzed in recliner on 9/4 but signed off early due to pain, has declined to come in a recliner since then.  Per Renal Navigator notes there are no clinics that will accept patient unless she will have HD in chair. Will continue to encourage use of chair for HD.   Jen Mow, PA-C [facility] 12/07/2021,11:04 AM  LOS: 103 days

## 2021-12-07 NOTE — Progress Notes (Signed)
Received patient in bed to unit.  Alert and oriented.  Informed consent signed and in chart.   Treatment initiated: 0726 Treatment completed: 1202  Patient tolerated well.  Transported back to the room  Alert, without acute distress.  Hand-off given to patient's nurse.   Access used: [place] Access issues: none  Total UF removed: 4400 Medication(s) given: heparin bolus Post HD VS: 97.9, 129/52(74), HR-61, RR-10, Sp02-100 Post HD weight: 90.3kg   Patient received 4000u heparin bolus at beginning of treatment. Towards the end the TMP started to elevate up to 36 so Penninger, PA verbally ordered 1000u extra. Completed treatment without clotting. [facility]

## 2021-12-07 NOTE — Progress Notes (Signed)
[facility]                                                                              [name], is a 56 y.o. 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  Patient fell while trying to transfer from wheelchair to recliner on 5/19 and was found to have distal femoral fracture, deemed nonoperative.  She was admitted 6/3-6/23 and underwent partial resection of femur for osteomyelitis of infected soft tissue and rearrangement of bony fragments, was on antibiotics as guided by ID with cultures growing Staph aureus.  Patient presented secondary to copious left hip bleeding and found to have evidence of subacute osteomyelitis and abscess of left femur.  Orthopedics/ID was consulted.  Patient was started on empiric antibiotics and underwent multiple I&D's by Dr. [name] on 5/36, had application of Kerecis micro powder 7/1 with local tissue rearrangement.  She was also seen by vascular surgery, did with fistula branch ligation, completed 6 weeks course of daptomycin on 10/09/2021.  Discharge pending due to inability to tolerate HD in a chair, complicated by patient's refusal/inability to sit in a chair secondary to pain.  She tolerated HD in chair on 9/4.  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 [name] 1 week after discharge -Left knee reimaging on 10/2 showed the fracture line still visible but callus formation  - TOC and renal navigator assisting with placement issues, has been denied for stretcher HD at local [place] system clinic and no other clinic has approved either at this time -No acute issues, awaiting placement   Active Problems:   ESRD (end stage renal disease) on dialysis ([place]), MWF -Continue HD per nephrology, still on Lasix -Has to do HD chair for outpatient hemodialysis, complicated by patient's refusal/inability to sit in a chair    Type 2 diabetes mellitus with hyperlipidemia ([place]) -Continue SSI, Lantus 7 units nightly, meal coverage 4 units 3 times daily AC -CBG stable    History of left intertrochanteric fracture -Previously managed with IM nail placement in 2021, hardware removed secondary to #1  Hyponatremia, hyperkalemia, hypomagnesemia -Electrolytes to be managed with HD and Lokelma as needed for hyperkalemia  ABLA superimposed on anemia of chronic disease H&H stable, 9.6    History of CVA (cerebrovascular accident) -Continue Lipitor  Primary hypertension -Continue amlodipine, hydralazine, Coreg  Right knee pain -Likely osteoarthritis, x-ray from 9/19 negative for any fracture  GERD -Continue Pepcid, Protonix  Anxiety, depression -Continue amitriptyline, Cymbalta   Code Status: Full code DVT Prophylaxis:  Place TED hose Start: 09/06/21 1507 SCDs Start: 08/31/21 1058 heparin injection 5,000 Units Start: 08/26/21 1615   Level of Care: Level of care: Med-Surg Family Communication: Updated patieny  Disposition Plan:      Remains inpatient appropriate: Difficult placement due to inability to sit in the chair for HD   Procedures:  Hemodialysis  Consultants:   Nephrology Orthopedics Discussed surgery  Antimicrobials:   Anti-infectives (From admission, onward)    Start     Dose/Rate Route Frequency  Ordered Stop   10/02/21 2000  DAPTOmycin (CUBICIN) 900 mg in sodium chloride 0.9 % IVPB  Status:  Discontinued        10 mg/kg  92.4 kg 136 mL/hr over 30 Minutes Intravenous Once per day on Mon Wed Fri 10/01/21 1350 10/12/21 1146   09/30/21 2000  DAPTOmycin (CUBICIN) 900 mg in sodium chloride 0.9 % IVPB  Status:  Discontinued        10 mg/kg  92.4 kg 136 mL/hr over 30 Minutes Intravenous Daily 09/28/21 0919 10/01/21 1350   09/28/21 2000  DAPTOmycin (CUBICIN) 900 mg in sodium chloride 0.9 % IVPB        900 mg 136 mL/hr over 30 Minutes Intravenous  Once 09/28/21 0919 09/28/21 2146   09/02/21 2000  DAPTOmycin (CUBICIN) 900 mg in sodium chloride 0.9 % IVPB  Status:  Discontinued        10 mg/kg  92.4 kg 136 mL/hr over 30 Minutes Intravenous Once per day on Mon Wed Fri 09/02/21 0836 09/28/21 0919   08/31/21 0745  ceFAZolin (ANCEF) IVPB 2g/100 mL premix        2 g 200 mL/hr over 30 Minutes Intravenous To Short Stay 08/31/21 0730 08/31/21 0918   08/30/21 0600  ceFAZolin (ANCEF) IVPB 2g/100 mL premix  Status:  Discontinued        2 g 200 mL/hr over 30 Minutes Intravenous To Short Stay 08/30/21 0123 08/31/21 0600   08/29/21 2000  DAPTOmycin (CUBICIN) 900 mg in sodium chloride 0.9 % IVPB  Status:  Discontinued        10 mg/kg  92.4 kg 136 mL/hr over 30 Minutes Intravenous Every 48 hours 08/29/21 1627 09/02/21 0836   08/28/21 1800  ceFAZolin (ANCEF) IVPB 2g/100 mL premix  Status:  Discontinued        2 g 200 mL/hr over 30 Minutes Intravenous Every M-W-F (1800) 08/27/21 0846 08/29/21 1620   08/28/21 1200  ceFAZolin (ANCEF) IVPB 1 g/50 mL premix  Status:  Discontinued        1 g 100 mL/hr over 30 Minutes Intravenous Every M-W-F (Hemodialysis) 08/26/21 1954 08/27/21 0846   08/28/21 0600  ceFAZolin (ANCEF) IVPB 2g/100 mL premix        2 g 200 mL/hr over 30 Minutes Intravenous On call to O.R. 08/27/21 1150 08/28/21 0909   08/27/21 1800  ceFAZolin (ANCEF) IVPB 1 g/50 mL premix        1 g 100  mL/hr over 30 Minutes Intravenous  Once 08/26/21 1954 08/27/21 2048   08/26/21 1500  ceFEPIme (MAXIPIME) 1 g in sodium chloride 0.9 % 100 mL IVPB  Status:  Discontinued        1 g 200 mL/hr over 30 Minutes Intravenous Every 24 hours 08/26/21 1452 08/26/21 1714          Medications  (feeding supplement) PROSource Plus  30 mL Oral  BID BM   alosetron  1 mg Oral BID   amitriptyline  25 mg Oral QHS   amLODipine  10 mg Oral Q24H   atorvastatin  40 mg Oral q1800   carvedilol  6.25 mg Oral BID   Chlorhexidine Gluconate Cloth  6 each Topical Q0600   cyclobenzaprine  10 mg Oral TID   docusate sodium  200 mg Oral BID   DULoxetine  60 mg Oral Daily   famotidine  20 mg Oral Daily   furosemide  80 mg Oral Daily   heparin  5,000 Units Subcutaneous Q8H   heparin sodium (porcine)       hydrALAZINE  25 mg Oral TID   insulin aspart  0-9 Units Subcutaneous TID WC   insulin aspart  4 Units Subcutaneous TID WC   insulin glargine-yfgn  7 Units Subcutaneous QHS   multivitamin  1 tablet Oral QHS   pantoprazole  80 mg Oral Daily   polyethylene glycol  17 g Oral Daily   sevelamer carbonate  2,400 mg Oral TID WC   sodium zirconium cyclosilicate  10 g Oral Once per day on Sun Tue Thu Sat      Subjective:   Mylin Gignac was seen and examined today.  Seen during HD, no complaints watching TV.   Objective:   Vitals:   12/07/21 1200 12/07/21 1202 12/07/21 1211 12/07/21 1232  BP: (!) 120/54  (!) 129/52   Pulse: 62 63 61   Resp: 13 12 10    Temp:   97.9 F (36.6 C)   TempSrc:   Oral   SpO2: 98% 96% 100%   Weight:    90.3 kg  Height:        Intake/Output Summary (Last 24 hours) at 12/07/2021 1345 Last data filed at 12/07/2021 1211 Gross per 24 hour  Intake 360 ml  Output 4600 ml  Net -4240 ml     Wt Readings from Last 3 Encounters:  12/07/21 90.3 kg  08/23/21 96.5 kg  08/07/21 101.1 kg   Physical Exam General: Alert and oriented x 3, NAD Cardiovascular: S1 S2 clear, RRR.   Respiratory: CTAB, no wheezing Gastrointestinal: Soft, nontender, nondistended, NBS Ext: left BKA Psych: Normal affect    Data Reviewed:  I have personally reviewed following labs    CBC Lab Results  Component Value Date   WBC 6.9 12/07/2021   RBC 3.52 (L) 12/07/2021   HGB 9.6 (L) 12/07/2021   HCT 30.2 (L) 12/07/2021   MCV 85.8 12/07/2021   MCH 27.3 12/07/2021   PLT 189 12/07/2021   MCHC 31.8 12/07/2021   RDW 15.6 (H) 12/07/2021   LYMPHSABS 2.1 12/06/2021   MONOABS 1.0 12/06/2021   EOSABS 0.2 12/06/2021   BASOSABS 0.1 16/12/9602     Last metabolic panel Lab Results  Component Value Date   NA 130 (L) 12/07/2021   K 4.5 12/07/2021   CL 90 (L) 12/07/2021   CO2 26 12/07/2021   BUN 79 (H) 12/07/2021   CREATININE 5.58 (H) 12/07/2021   GLUCOSE 125 (H) 12/07/2021   GFRNONAA 9 (L) 12/07/2021   GFRAA 13 (L) 12/17/2018   CALCIUM 9.2 12/07/2021   PHOS 5.7 (H) 12/07/2021   PROT 7.6 12/02/2021   ALBUMIN 3.1 (L) 12/07/2021   LABGLOB 3.1 10/29/2018   AGRATIO 1.4 10/29/2018   BILITOT 0.6 12/02/2021   ALKPHOS 105 12/02/2021   AST 16 12/02/2021   ALT 15 12/02/2021   ANIONGAP 14 12/07/2021    CBG (last 3)  Recent Labs    12/06/21 1121 12/06/21 1631 12/06/21 2039  GLUCAP 210* 81 186*      Coagulation Profile: No results for input(s): "INR", "PROTIME" in the last 168 hours.   Radiology Studies: I have personally reviewed the imaging studies  No results found.     Estill Cotta M.D. [facility] 12/07/2021, 1:45 PM  Available via Epic secure chat 7am-7pm After 7 pm, please refer to night coverage provider listed on amion.

## 2021-12-08 DIAGNOSIS — N186 End stage renal disease: Secondary | ICD-10-CM | POA: Diagnosis not present

## 2021-12-08 DIAGNOSIS — F419 Anxiety disorder, unspecified: Secondary | ICD-10-CM | POA: Diagnosis not present

## 2021-12-08 DIAGNOSIS — M86252 Subacute osteomyelitis, left femur: Secondary | ICD-10-CM | POA: Diagnosis not present

## 2021-12-08 DIAGNOSIS — K2100 Gastro-esophageal reflux disease with esophagitis, without bleeding: Secondary | ICD-10-CM | POA: Diagnosis not present

## 2021-12-08 LAB — GLUCOSE, CAPILLARY
Glucose-Capillary: 174 mg/dL — ABNORMAL HIGH (ref 70–99)
Glucose-Capillary: 179 mg/dL — ABNORMAL HIGH (ref 70–99)
Glucose-Capillary: 192 mg/dL — ABNORMAL HIGH (ref 70–99)
Glucose-Capillary: 155 mg/dL — ABNORMAL HIGH (ref 70–99)

## 2021-12-08 MED ORDER — HYDROMORPHONE HCL 2 MG PO TABS
2.0000 mg | ORAL_TABLET | ORAL | Status: DC | PRN
  Administered 2021-12-08 – 2021-12-10 (×7): 2 mg via ORAL
  Filled 2021-12-08 (×7): qty 1

## 2021-12-08 MED ORDER — PREGABALIN 25 MG PO CAPS
25.0000 mg | ORAL_CAPSULE | Freq: Every day | ORAL | Status: DC
  Filled 2021-12-08: qty 1

## 2021-12-08 MED ORDER — ONDANSETRON HCL 4 MG PO TABS
4.0000 mg | ORAL_TABLET | ORAL | Status: DC | PRN
  Administered 2021-12-08 – 2022-03-18 (×29): 4 mg via ORAL
  Filled 2021-12-08 (×31): qty 1

## 2021-12-08 MED ORDER — HYDROMORPHONE HCL 2 MG PO TABS
3.0000 mg | ORAL_TABLET | ORAL | Status: DC | PRN
  Administered 2021-12-08: 3 mg via ORAL
  Filled 2021-12-08: qty 2

## 2021-12-08 MED ORDER — OXYCODONE HCL ER 10 MG PO T12A: 10.0000 mg | EXTENDED_RELEASE_TABLET | Freq: Two times a day (BID) | ORAL | Status: DC

## 2021-12-08 MED ORDER — CHLORHEXIDINE GLUCONATE CLOTH 2 % EX PADS: 6.0000 | MEDICATED_PAD | Freq: Every day | CUTANEOUS | Status: DC

## 2021-12-08 NOTE — Progress Notes (Signed)
[facility]                                                                              [name], is a 56 y.o. 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  Patient fell while trying to transfer from wheelchair to recliner on 5/19 and was found to have distal femoral fracture, deemed nonoperative.  She was admitted 6/3-6/23 and underwent partial resection of femur for osteomyelitis of infected soft tissue and rearrangement of bony fragments, was on antibiotics as guided by ID with cultures growing Staph aureus.  Patient presented secondary to copious left hip bleeding and found to have evidence of subacute osteomyelitis and abscess of left femur.  Orthopedics/ID was consulted.  Patient was started on empiric antibiotics and underwent multiple I&D's by Dr. [name] on 8/58, had application of Kerecis micro powder 7/1 with local tissue rearrangement.  She was also seen by vascular surgery, did with fistula branch ligation, completed 6 weeks course of daptomycin on 10/09/2021.  Discharge pending due to inability to tolerate HD in a chair, complicated by patient's refusal/inability to sit in a chair secondary to pain.  She tolerated HD in chair on 9/4.  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 [name] 1 week after discharge -Left knee reimaging on 10/2 showed the fracture line still visible but callus formation  - TOC and renal navigator assisting with placement issues, has been denied for stretcher HD at local [place] system clinic and no other clinic has approved either at this time -Requesting to change her pain medications, states not helping.  Discussed with pharmacy, changed to oral Dilaudid 2 mg every 3 hours as needed for severe or breakthrough pain.  Patient states she is allergic to Lyrica. -Awaiting placement   Active Problems:   ESRD (end stage renal disease) on dialysis ([place]), MWF -Continue HD per nephrology, still on Lasix -Has to do HD chair for outpatient hemodialysis, complicated by patient's refusal/inability to sit in a chair    Type 2 diabetes mellitus with hyperlipidemia ([place]) -Continue SSI, Lantus 7 units nightly, meal coverage 4 units 3 times daily AC -CBG stable    History of left intertrochanteric fracture -Previously managed with IM nail placement in 2021, hardware removed secondary to #1  Hyponatremia, hyperkalemia, hypomagnesemia -Electrolytes to be managed with HD and Lokelma as needed for hyperkalemia  ABLA superimposed on anemia of chronic disease H&H  stable, 9.6    History of CVA (cerebrovascular accident) -Continue Lipitor  Primary hypertension -Continue amlodipine, hydralazine, Coreg  Right knee pain -Likely osteoarthritis, x-ray from 9/19 negative for any fracture   GERD -Continue Pepcid, Protonix  Anxiety, depression -Continue amitriptyline, Cymbalta   Code Status: Full code DVT Prophylaxis:  Place TED hose Start: 09/06/21 1507 SCDs Start: 08/31/21 1058 heparin injection 5,000 Units Start: 08/26/21 1615   Level of Care: Level of care: Med-Surg Family Communication: Updated patieny  Disposition Plan:      Remains inpatient appropriate: Difficult placement due to inability to sit in the chair for  HD   Procedures:  Hemodialysis  Consultants:   Nephrology Orthopedics Discussed surgery  Antimicrobials:   Anti-infectives (From admission, onward)    Start     Dose/Rate Route Frequency Ordered Stop   10/02/21 2000  DAPTOmycin (CUBICIN) 900 mg in sodium chloride 0.9 % IVPB  Status:  Discontinued        10 mg/kg  92.4 kg 136 mL/hr over 30 Minutes Intravenous Once per day on Mon Wed Fri 10/01/21 1350 10/12/21 1146   09/30/21 2000  DAPTOmycin (CUBICIN) 900 mg in sodium chloride 0.9 % IVPB  Status:  Discontinued        10 mg/kg  92.4 kg 136 mL/hr over 30 Minutes Intravenous Daily 09/28/21 0919 10/01/21 1350   09/28/21 2000  DAPTOmycin (CUBICIN) 900 mg in sodium chloride 0.9 % IVPB        900 mg 136 mL/hr over 30 Minutes Intravenous  Once 09/28/21 0919 09/28/21 2146   09/02/21 2000  DAPTOmycin (CUBICIN) 900 mg in sodium chloride 0.9 % IVPB  Status:  Discontinued        10 mg/kg  92.4 kg 136 mL/hr over 30 Minutes Intravenous Once per day on Mon Wed Fri 09/02/21 0836 09/28/21 0919   08/31/21 0745  ceFAZolin (ANCEF) IVPB 2g/100 mL premix        2 g 200 mL/hr over 30 Minutes Intravenous To Short Stay 08/31/21 0730 08/31/21 0918   08/30/21 0600  ceFAZolin (ANCEF) IVPB 2g/100 mL premix  Status:  Discontinued        2 g 200 mL/hr over 30 Minutes Intravenous To Short Stay 08/30/21 0123 08/31/21 0600   08/29/21 2000  DAPTOmycin (CUBICIN) 900 mg in sodium chloride 0.9 % IVPB  Status:  Discontinued        10 mg/kg  92.4 kg 136 mL/hr over 30 Minutes Intravenous Every 48 hours 08/29/21 1627 09/02/21 0836   08/28/21 1800  ceFAZolin (ANCEF) IVPB 2g/100 mL premix  Status:  Discontinued        2 g 200 mL/hr over 30 Minutes Intravenous Every M-W-F (1800) 08/27/21 0846 08/29/21 1620   08/28/21 1200  ceFAZolin (ANCEF) IVPB 1 g/50 mL premix  Status:  Discontinued        1 g 100 mL/hr over 30 Minutes Intravenous Every M-W-F (Hemodialysis) 08/26/21 1954 08/27/21 0846   08/28/21 0600  ceFAZolin  (ANCEF) IVPB 2g/100 mL premix        2 g 200 mL/hr over 30 Minutes Intravenous On call to O.R. 08/27/21 1150 08/28/21 0909   08/27/21 1800  ceFAZolin (ANCEF) IVPB 1 g/50 mL premix        1 g 100 mL/hr over 30 Minutes Intravenous  Once 08/26/21 1954 08/27/21 2048   08/26/21 1500  ceFEPIme (MAXIPIME) 1 g in sodium chloride 0.9 % 100 mL IVPB  Status:  Discontinued  1 g 200 mL/hr over 30 Minutes Intravenous Every 24 hours 08/26/21 1452 08/26/21 1714          Medications  (feeding supplement) PROSource Plus  30 mL Oral BID BM   alosetron  1 mg Oral BID   amitriptyline  25 mg Oral QHS   amLODipine  10 mg Oral Q24H   atorvastatin  40 mg Oral q1800   carvedilol  6.25 mg Oral BID   Chlorhexidine Gluconate Cloth  6 each Topical Q0600   cyclobenzaprine  10 mg Oral TID   docusate sodium  200 mg Oral BID   DULoxetine  60 mg Oral Daily   famotidine  20 mg Oral Daily   furosemide  80 mg Oral Daily   heparin  5,000 Units Subcutaneous Q8H   hydrALAZINE  25 mg Oral TID   insulin aspart  0-9 Units Subcutaneous TID WC   insulin aspart  4 Units Subcutaneous TID WC   insulin glargine-yfgn  7 Units Subcutaneous QHS   multivitamin  1 tablet Oral QHS   pantoprazole  80 mg Oral Daily   polyethylene glycol  17 g Oral Daily   sevelamer carbonate  2,400 mg Oral TID WC   sodium zirconium cyclosilicate  10 g Oral Once per day on Sun Tue Thu Sat      Subjective:   Coletta Lockner was seen and examined today.  States her pain medication needs to be changed and or new added, pain is not fully controlled..  Objective:   Vitals:   12/07/21 1739 12/07/21 2126 12/08/21 0536 12/08/21 0800  BP: 135/60 (!) 118/44 (!) 136/49 (!) 139/52  Pulse: 61 60 (!) 58 63  Resp: 18 17 17 18   Temp: 98.5 F (36.9 C) 98.1 F (36.7 C) 98.1 F (36.7 C) 98.4 F (36.9 C)  TempSrc: Oral Oral  Oral  SpO2: 98% 98% 98% 98%  Weight:      Height:        Intake/Output Summary (Last 24 hours) at 12/08/2021 1145 Last  data filed at 12/08/2021 0557 Gross per 24 hour  Intake 360 ml  Output 4600 ml  Net -4240 ml     Wt Readings from Last 3 Encounters:  12/07/21 90.3 kg  08/23/21 96.5 kg  08/07/21 101.1 kg   Physical Exam General: Alert and oriented x 3, NAD Cardiovascular: S1 S2 clear, RRR.  Respiratory: CTAB, no wheezing Gastrointestinal: Soft, nontender, nondistended, NBS Ext:  left BKA   Data Reviewed:  I have personally reviewed following labs    CBC Lab Results  Component Value Date   WBC 6.9 12/07/2021   RBC 3.52 (L) 12/07/2021   HGB 9.6 (L) 12/07/2021   HCT 30.2 (L) 12/07/2021   MCV 85.8 12/07/2021   MCH 27.3 12/07/2021   PLT 189 12/07/2021   MCHC 31.8 12/07/2021   RDW 15.6 (H) 12/07/2021   LYMPHSABS 2.1 12/06/2021   MONOABS 1.0 12/06/2021   EOSABS 0.2 12/06/2021   BASOSABS 0.1 62/70/3500     Last metabolic panel Lab Results  Component Value Date   NA 130 (L) 12/07/2021   K 4.5 12/07/2021   CL 90 (L) 12/07/2021   CO2 26 12/07/2021   BUN 79 (H) 12/07/2021   CREATININE 5.58 (H) 12/07/2021   GLUCOSE 125 (H) 12/07/2021   GFRNONAA 9 (L) 12/07/2021   GFRAA 13 (L) 12/17/2018   CALCIUM 9.2 12/07/2021   PHOS 5.7 (H) 12/07/2021   PROT 7.6 12/02/2021   ALBUMIN 3.1 (L) 12/07/2021  LABGLOB 3.1 10/29/2018   AGRATIO 1.4 10/29/2018   BILITOT 0.6 12/02/2021   ALKPHOS 105 12/02/2021   AST 16 12/02/2021   ALT 15 12/02/2021   ANIONGAP 14 12/07/2021    CBG (last 3)  Recent Labs    12/07/21 2125 12/08/21 0738 12/08/21 1104  GLUCAP 179* 174* 179*      Coagulation Profile: No results for input(s): "INR", "PROTIME" in the last 168 hours.   Radiology Studies: I have personally reviewed the imaging studies  No results found.     Estill Cotta M.D. [facility] 12/08/2021, 11:45 AM  Available via Epic secure chat 7am-7pm After 7 pm, please refer to night coverage provider listed on amion.

## 2021-12-08 NOTE — Plan of Care (Signed)
  Problem: Pain Managment: Goal: General experience of comfort will improve Outcome: Not Progressing   

## 2021-12-08 NOTE — Progress Notes (Signed)
[facility] Progress Note   Subjective:   Patient seen and examined at bedside.  No specific complaints.  States she will work with PT on Tuesday so she can try to transfer to chair on Wednesday or Friday.  Denies CP, SOB, abdominal pain, weakness, dizziness and fatigue.   Objective Vitals:   12/07/21 1739 12/07/21 2126 12/08/21 0536 12/08/21 0800  BP: 135/60 (!) 118/44 (!) 136/49 (!) 139/52  Pulse: 61 60 (!) 58 63  Resp: 18 17 17 18   Temp: 98.5 F (36.9 C) 98.1 F (36.7 C) 98.1 F (36.7 C) 98.4 F (36.9 C)  TempSrc: Oral Oral  Oral  SpO2: 98% 98% 98% 98%  Weight:      Height:       Physical Exam General:WDWN female in NAD Heart:RRR, no mrg Lungs:CTAB, nml WOB on RA Abdomen:soft, NTND Extremities:1+ RLE edema, trace L stump edema, L BKA Dialysis Access: LU AVF +thrill   Filed Weights   12/05/21 1354 12/07/21 0828 12/07/21 1232  Weight: 92.5 kg 94.7 kg 90.3 kg    Intake/Output Summary (Last 24 hours) at 12/08/2021 1103 Last data filed at 12/08/2021 0557 Gross per 24 hour  Intake 360 ml  Output 4600 ml  Net -4240 ml    Additional Objective Labs: Basic Metabolic Panel: Recent Labs  Lab 12/05/21 0856 12/06/21 0343 12/07/21 0703  NA 125* 132* 130*  K 4.4 4.1 4.5  CL 87* 91* 90*  CO2 25 31 26   GLUCOSE 180* 146* 125*  BUN 88* 47* 79*  CREATININE 6.31* 4.32* 5.58*  CALCIUM 8.9 9.7 9.2  PHOS 6.3* 4.7* 5.7*   Liver Function Tests: Recent Labs  Lab 12/02/21 0511 12/03/21 1502 12/05/21 0856 12/06/21 0343 12/07/21 0703  AST 16  --   --   --   --   ALT 15  --   --   --   --   ALKPHOS 105  --   --   --   --   BILITOT 0.6  --   --   --   --   PROT 7.6  --   --   --   --   ALBUMIN 3.3*   < > 3.1* 3.1* 3.1*   < > = values in this interval not displayed.    CBC: Recent Labs  Lab 12/02/21 0511 12/03/21 1502 12/05/21 0856 12/06/21 0343 12/07/21 0703  WBC 6.7 6.5 6.5 5.7 6.9  NEUTROABS 3.2  --   --  2.3  --   HGB 10.6* 9.9* 9.7* 10.5*  9.6*  HCT 31.7* 30.2* 29.4* 31.4* 30.2*  MCV 83.2 83.9 83.5 83.7 85.8  PLT 183 191 177 165 189    CBG: Recent Labs  Lab 12/06/21 2039 12/07/21 1352 12/07/21 1735 12/07/21 2125 12/08/21 0738  GLUCAP 186* 126* 169* 179* 174*   Iron Studies:  Recent Labs    12/06/21 0343  IRON 110  TIBC 204*  FERRITIN 374*   Lab Results  Component Value Date   INR 1.1 12/28/2019   INR 1.2 12/17/2018   INR 0.99 04/07/2017   Studies/Results: No results found.  Medications:  methocarbamol (ROBAXIN) IV      (feeding supplement) PROSource Plus  30 mL Oral BID BM   alosetron  1 mg Oral BID   amitriptyline  25 mg Oral QHS   amLODipine  10 mg Oral Q24H   atorvastatin  40 mg Oral q1800   carvedilol  6.25 mg Oral BID   Chlorhexidine Gluconate  Cloth  6 each Topical Q0600   cyclobenzaprine  10 mg Oral TID   docusate sodium  200 mg Oral BID   DULoxetine  60 mg Oral Daily   famotidine  20 mg Oral Daily   furosemide  80 mg Oral Daily   heparin  5,000 Units Subcutaneous Q8H   hydrALAZINE  25 mg Oral TID   insulin aspart  0-9 Units Subcutaneous TID WC   insulin aspart  4 Units Subcutaneous TID WC   insulin glargine-yfgn  7 Units Subcutaneous QHS   multivitamin  1 tablet Oral QHS   pantoprazole  80 mg Oral Daily   polyethylene glycol  17 g Oral Daily   sevelamer carbonate  2,400 mg Oral TID WC   sodium zirconium cyclosilicate  10 g Oral Once per day on Sun Tue Thu Sat    Dialysis Orders: MWF at [facility], 400/500, EDW 90kg, 2K/2Ca, LUE AVF - heparin 4000 unit IV bolus - No ESA   Assessment/Plan: Recurrent L hip osteomyelitis: S/p 08/10/21 surgery (removal hip IMN which was placed  12/2019) with persistent VRE infection. Back to OR 08/28/21, 08/31/21 for L hip debridement and wound vac placement (now removed). Finished 6 week course of IV Daptomycin on 10/12/21. Management per primary team.  Chronic L hip/stump pain: She does have fractures of L femoral neck (where hardware was  removed) as well as distal femur Fx. Pain control per primary.  ESRD: Continue HD on usual MWF.  Next HD 12/09/21.  Hyperkalemia: Recurrent issues, on 2K bath + furosemide. Lokelma 10g added on non-HD days -> now prn. K+ today 4.1. AVF dysfunction: S/p branch ligation in OR 09/19/2021 per Dr. Virl Cagey. AVF working well now. HTN/volume: BP controlled on amlodipine/hydralazine/Coreg. Ongoing edema and large gains.  Reinforced fluid and sodium restrictions. Getting ~ 4-4.5L off q HD.  Not quite to EDW.  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  No need for iron or ESA at present.  If Hgb continues to drop will start ESA.  Secondary HPTH: CorrCa slightly high, not on VDRA. Phos variable. Continue Renvela as binder. Nutrition: Alb low, continue protein supplements. DM2: Insulin per primary Dispo: SNF previously recommended, now she would like to go home on discharge even though HD unit has been clear that they are prohibited by company policy to assist patient out of her car and into the facility. Patient is aware of this - says she is going to work with transfer with PT.  Asks about home HD but they would NOT be able to train her since she is in the hospital. Dialyzed in recliner on 9/4 but signed off early due to pain, has declined to come in a recliner since then.  Per Renal Navigator notes there are no clinics that will accept patient unless she will have HD in chair. Will continue to encourage use of chair for HD. Agreed to try again Wednesday &/or Friday.   Jen Mow, PA-C [facility] 12/08/2021,11:03 AM  LOS: 104 days

## 2021-12-09 DIAGNOSIS — N186 End stage renal disease: Secondary | ICD-10-CM | POA: Diagnosis not present

## 2021-12-09 DIAGNOSIS — K2100 Gastro-esophageal reflux disease with esophagitis, without bleeding: Secondary | ICD-10-CM | POA: Diagnosis not present

## 2021-12-09 DIAGNOSIS — F419 Anxiety disorder, unspecified: Secondary | ICD-10-CM | POA: Diagnosis not present

## 2021-12-09 DIAGNOSIS — M86252 Subacute osteomyelitis, left femur: Secondary | ICD-10-CM | POA: Diagnosis not present

## 2021-12-09 LAB — RENAL FUNCTION PANEL
Albumin: 3.3 g/dL — ABNORMAL LOW (ref 3.5–5.0)
Anion gap: 13 (ref 5–15)
BUN: 81 mg/dL — ABNORMAL HIGH (ref 6–20)
CO2: 26 mmol/L (ref 22–32)
Calcium: 9.3 mg/dL (ref 8.9–10.3)
Chloride: 88 mmol/L — ABNORMAL LOW (ref 98–111)
Creatinine, Ser: 5.62 mg/dL — ABNORMAL HIGH (ref 0.44–1.00)
GFR, Estimated: 8 mL/min — ABNORMAL LOW (ref >60)
Glucose, Bld: 195 mg/dL — ABNORMAL HIGH (ref 70–99)
Phosphorus: 5.4 mg/dL — ABNORMAL HIGH (ref 2.5–4.6)
Potassium: 5.2 mmol/L — ABNORMAL HIGH (ref 3.5–5.1)
Sodium: 127 mmol/L — ABNORMAL LOW (ref 135–145)

## 2021-12-09 LAB — CBC
HCT: 30.2 % — ABNORMAL LOW (ref 36.0–46.0)
Hemoglobin: 10.1 g/dL — ABNORMAL LOW (ref 12.0–15.0)
MCH: 27.5 pg (ref 26.0–34.0)
MCHC: 33.4 g/dL (ref 30.0–36.0)
MCV: 82.3 fL (ref 80.0–100.0)
Platelets: 180 10*3/uL (ref 150–400)
RBC: 3.67 MIL/uL — ABNORMAL LOW (ref 3.87–5.11)
RDW: 15.1 % (ref 11.5–15.5)
WBC: 6.7 10*3/uL (ref 4.0–10.5)
nRBC: 0 % (ref 0.0–0.2)

## 2021-12-09 LAB — GLUCOSE, CAPILLARY
Glucose-Capillary: 119 mg/dL — ABNORMAL HIGH (ref 70–99)
Glucose-Capillary: 173 mg/dL — ABNORMAL HIGH (ref 70–99)
Glucose-Capillary: 181 mg/dL — ABNORMAL HIGH (ref 70–99)

## 2021-12-09 MED ORDER — HEPARIN SODIUM (PORCINE) 1000 UNIT/ML DIALYSIS: 1000.0000 [IU] | INTRAMUSCULAR | Status: DC | PRN

## 2021-12-09 MED ORDER — LIDOCAINE-PRILOCAINE 2.5-2.5 % EX CREA
1.0000 | TOPICAL_CREAM | CUTANEOUS | Status: DC | PRN
  Filled 2021-12-09: qty 5

## 2021-12-09 MED ORDER — PENTAFLUOROPROP-TETRAFLUOROETH EX AERO: 1.0000 | INHALATION_SPRAY | CUTANEOUS | Status: DC | PRN

## 2021-12-09 MED ORDER — HEPARIN SODIUM (PORCINE) 1000 UNIT/ML DIALYSIS
4000.0000 [IU] | INTRAMUSCULAR | Status: DC | PRN
  Administered 2021-12-09: 4000 [IU] via INTRAVENOUS_CENTRAL

## 2021-12-09 MED ORDER — ANTICOAGULANT SODIUM CITRATE 4% (200MG/5ML) IV SOLN: 5.0000 mL | Status: DC | PRN

## 2021-12-09 MED ORDER — HEPARIN SODIUM (PORCINE) 1000 UNIT/ML IJ SOLN
INTRAMUSCULAR | Status: AC
  Administered 2021-12-09: 2000 [IU]
  Filled 2021-12-09: qty 2

## 2021-12-09 MED ORDER — HEPARIN SODIUM (PORCINE) 1000 UNIT/ML IJ SOLN
INTRAMUSCULAR | Status: AC
  Filled 2021-12-09: qty 4

## 2021-12-09 MED ORDER — ALTEPLASE 2 MG IJ SOLR: 2.0000 mg | Freq: Once | INTRAMUSCULAR | Status: DC | PRN

## 2021-12-09 NOTE — Progress Notes (Signed)
Pt off unit to hemodialysis. 

## 2021-12-09 NOTE — Progress Notes (Signed)
Received patient in bed to unit.  Alert and oriented.  Informed consent signed and in chart.   Treatment initiated: 1214 Treatment completed: 1642  Patient tolerated well.  Transported back to the room  Alert, without acute distress.  Hand-off given to patient's nurse.   Access used: fistula Access issues: none  Total UF removed: 5000 Medication(s) given: Heparin bolus Post HD VS: 98, 116/62(79),HR-63, RR-13, SP02-100 Post HD weight: 88.8kg   [facility]

## 2021-12-09 NOTE — Progress Notes (Signed)
[facility] Progress Note   Subjective:    Seen and examined patient at bedside. No complaints. Plan for HD today.  Objective Vitals:   12/09/21 0536 12/09/21 1050 12/09/21 1200 12/09/21 1207  BP: (!) 142/57 (!) 159/66 (!) 149/60   Pulse: 60 63 (!) 56 (!) 57  Resp: 16 18 (!) 22 11  Temp: 98.1 F (36.7 C) 97.9 F (36.6 C) 97.8 F (36.6 C)   TempSrc:  Oral Oral   SpO2: 100% 100% 96% 97%  Weight:   93.8 kg   Height:       Physical Exam General:WDWN female in NAD Heart:RRR, no mrg Lungs:CTAB, nml WOB on RA Abdomen:soft, NTND Extremities:1+ RLE edema, trace L stump edema, L BKA Dialysis Access: LU AVF +thrill   Filed Weights   12/07/21 0828 12/07/21 1232 12/09/21 1200  Weight: 94.7 kg 90.3 kg 93.8 kg    Intake/Output Summary (Last 24 hours) at 12/09/2021 1209 Last data filed at 12/09/2021 1057 Gross per 24 hour  Intake 715 ml  Output 300 ml  Net 415 ml    Additional Objective Labs: Basic Metabolic Panel: Recent Labs  Lab 12/05/21 0856 12/06/21 0343 12/07/21 0703  NA 125* 132* 130*  K 4.4 4.1 4.5  CL 87* 91* 90*  CO2 25 31 26   GLUCOSE 180* 146* 125*  BUN 88* 47* 79*  CREATININE 6.31* 4.32* 5.58*  CALCIUM 8.9 9.7 9.2  PHOS 6.3* 4.7* 5.7*   Liver Function Tests: Recent Labs  Lab 12/05/21 0856 12/06/21 0343 12/07/21 0703  ALBUMIN 3.1* 3.1* 3.1*   No results for input(s): "LIPASE", "AMYLASE" in the last 168 hours. CBC: Recent Labs  Lab 12/03/21 1502 12/05/21 0856 12/06/21 0343 12/07/21 0703  WBC 6.5 6.5 5.7 6.9  NEUTROABS  --   --  2.3  --   HGB 9.9* 9.7* 10.5* 9.6*  HCT 30.2* 29.4* 31.4* 30.2*  MCV 83.9 83.5 83.7 85.8  PLT 191 177 165 189   Blood Culture    Component Value Date/Time   SDES TISSUE 08/28/2021 0917   SPECREQUEST LEFT THIGH TISSUE 08/28/2021 0917   CULT  08/28/2021 0917    MODERATE ENTEROCOCCUS FAECIUM VANCOMYCIN RESISTANT ENTEROCOCCUS ISOLATED NO ANAEROBES ISOLATED Sent to [place] for further  susceptibility testing. Performed at [facility], [place] [address] [place], [place] [address]    REPTSTATUS 09/02/2021 FINAL 08/28/2021 0093    Cardiac Enzymes: No results for input(s): "CKTOTAL", "CKMB", "CKMBINDEX", "TROPONINI" in the last 168 hours. CBG: Recent Labs  Lab 12/08/21 0738 12/08/21 1104 12/08/21 1609 12/08/21 2115 12/09/21 0724  GLUCAP 174* 179* 192* 155* 119*   Iron Studies: No results for input(s): "IRON", "TIBC", "TRANSFERRIN", "FERRITIN" in the last 72 hours. Lab Results  Component Value Date   INR 1.1 12/28/2019   INR 1.2 12/17/2018   INR 0.99 04/07/2017   Studies/Results: No results found.  Medications:  anticoagulant sodium citrate     methocarbamol (ROBAXIN) IV      (feeding supplement) PROSource Plus  30 mL Oral BID BM   alosetron  1 mg Oral BID   amitriptyline  25 mg Oral QHS   amLODipine  10 mg Oral Q24H   atorvastatin  40 mg Oral q1800   carvedilol  6.25 mg Oral BID   Chlorhexidine Gluconate Cloth  6 each Topical Q0600   cyclobenzaprine  10 mg Oral TID   docusate sodium  200 mg Oral BID   DULoxetine  60 mg Oral Daily   famotidine  20 mg Oral Daily   furosemide  80 mg Oral Daily   heparin  5,000 Units Subcutaneous Q8H   hydrALAZINE  25 mg Oral TID   insulin aspart  0-9 Units Subcutaneous TID WC   insulin aspart  4 Units Subcutaneous TID WC   insulin glargine-yfgn  7 Units Subcutaneous QHS   multivitamin  1 tablet Oral QHS   pantoprazole  80 mg Oral Daily   polyethylene glycol  17 g Oral Daily   sevelamer carbonate  2,400 mg Oral TID WC   sodium zirconium cyclosilicate  10 g Oral Once per day on Sun Tue Thu Sat    Dialysis Orders: MWF at [facility], 400/500, EDW 90kg, 2K/2Ca, LUE AVF - heparin 4000 unit IV bolus - No ESA  Assessment/Plan: Recurrent L hip osteomyelitis: S/p 08/10/21 surgery (removal hip IMN which was placed  12/2019) with persistent VRE infection. Back to OR 08/28/21, 08/31/21 for L hip debridement and  wound vac placement (now removed). Finished 6 week course of IV Daptomycin on 10/12/21. Management per primary team.  Chronic L hip/stump pain: She does have fractures of L femoral neck (where hardware was removed) as well as distal femur Fx. Pain control per primary.  ESRD: Continue HD on usual MWF.  Next HD today.  Hyperkalemia: Recurrent issues, on 2K bath + furosemide. Lokelma 10g added on non-HD days -> now prn. K+ today 4.5. AVF dysfunction: S/p branch ligation in OR 09/19/2021 per Dr. Virl Cagey. AVF working well now. HTN/volume: BP controlled on amlodipine/hydralazine/Coreg. Ongoing edema and large gains.  Reinforced fluid and sodium restrictions. Getting ~ 4-4.5L off q HD.  Not quite to EDW.  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  No need for iron or ESA at present.  If Hgb continues to drop will start ESA.  Secondary HPTH: CorrCa slightly high, not on VDRA. Phos variable. Continue Renvela as binder. Nutrition: Alb low, continue protein supplements. DM2: Insulin per primary Dispo: SNF previously recommended, now she would like to go home on discharge even though HD unit has been clear that they are prohibited by company policy to assist patient out of her car and into the facility. Patient is aware of this - says she is going to work with transfer with PT.  Asks about home HD but they would NOT be able to train her since she is in the hospital. Dialyzed in recliner on 9/4 but signed off early due to pain, has declined to come in a recliner since then.  Per Renal Navigator notes there are no clinics that will accept patient unless she will have HD in chair. Will continue to encourage use of chair for HD. Agreed to try again Wednesday &/or Friday.   Tobie Poet, NP [facility] 12/09/2021,12:09 PM  LOS: 105 days

## 2021-12-09 NOTE — Progress Notes (Signed)
[facility]                                                                              [name], is a 56 y.o. 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  Patient fell while trying to transfer from wheelchair to recliner on 5/19 and was found to have distal femoral fracture, deemed nonoperative.  She was admitted 6/3-6/23 and underwent partial resection of femur for osteomyelitis of infected soft tissue and rearrangement of bony fragments, was on antibiotics as guided by ID with cultures growing Staph aureus.  Patient presented secondary to copious left hip bleeding and found to have evidence of subacute osteomyelitis and abscess of left femur.  Orthopedics/ID was consulted.  Patient was started on empiric antibiotics and underwent multiple I&D's by Dr. [name] on 8/67, had application of Kerecis micro powder 7/1 with local tissue rearrangement.  She was also seen by vascular surgery, did with fistula branch ligation, completed 6 weeks course of daptomycin on 10/09/2021.  Discharge pending due to inability to tolerate HD in a chair, complicated by patient's refusal/inability to sit in a chair secondary to pain.  She tolerated HD in chair on 9/4.  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 [name] 1 week after discharge -Left knee reimaging on 10/2 showed the fracture line still visible but callus formation  - TOC and renal navigator assisting with placement issues, has been denied for stretcher HD at local [place] system clinic and no other clinic has approved either at this time -Patient reports somewhat better control of the pain with oral Dilaudid as needed. -Awaiting placement   Active Problems:   ESRD (end stage renal disease) on dialysis ([place]), MWF -Continue HD per nephrology, still on Lasix -Has to do HD chair for outpatient hemodialysis, complicated by patient's refusal/inability to sit in a chair    Type 2 diabetes mellitus with hyperlipidemia ([place]) -Continue SSI, Lantus 7 units nightly, meal coverage 4 units 3 times daily AC -CBG stable Recent Labs    12/07/21 2125 12/08/21 0738 12/08/21 1104 12/08/21 1609 12/08/21 2115 12/09/21 0724  GLUCAP 179* 174* 179* 192* 155* 119*       History of left intertrochanteric fracture -Previously managed with IM nail placement in 2021, hardware removed secondary to #1  Hyponatremia, hyperkalemia, hypomagnesemia -Electrolytes to be managed with HD and Lokelma as needed for hyperkalemia  ABLA superimposed on anemia  of chronic disease -H&H stable    History of CVA (cerebrovascular accident) -Continue Lipitor  Primary hypertension -Continue amlodipine, hydralazine, Coreg  Right knee pain -Likely osteoarthritis, x-ray from 9/19 negative for any fracture   GERD -Continue Pepcid, Protonix  Anxiety, depression -Continue amitriptyline, Cymbalta   Code Status: Full code DVT Prophylaxis:  Place TED hose Start: 09/06/21 1507 SCDs Start: 08/31/21 1058 heparin injection 5,000 Units Start: 08/26/21 1615   Level of Care: Level of care: Med-Surg Family Communication: Updated patieny  Disposition Plan:      Remains inpatient appropriate: Difficult placement due to inability to sit in the  chair for HD   Procedures:  Hemodialysis  Consultants:   Nephrology Orthopedics Discussed surgery  Antimicrobials:   Anti-infectives (From admission, onward)    Start     Dose/Rate Route Frequency Ordered Stop   10/02/21 2000  DAPTOmycin (CUBICIN) 900 mg in sodium chloride 0.9 % IVPB  Status:  Discontinued        10 mg/kg  92.4 kg 136 mL/hr over 30 Minutes Intravenous Once per day on Mon Wed Fri 10/01/21 1350 10/12/21 1146   09/30/21 2000  DAPTOmycin (CUBICIN) 900 mg in sodium chloride 0.9 % IVPB  Status:  Discontinued        10 mg/kg  92.4 kg 136 mL/hr over 30 Minutes Intravenous Daily 09/28/21 0919 10/01/21 1350   09/28/21 2000  DAPTOmycin (CUBICIN) 900 mg in sodium chloride 0.9 % IVPB        900 mg 136 mL/hr over 30 Minutes Intravenous  Once 09/28/21 0919 09/28/21 2146   09/02/21 2000  DAPTOmycin (CUBICIN) 900 mg in sodium chloride 0.9 % IVPB  Status:  Discontinued        10 mg/kg  92.4 kg 136 mL/hr over 30 Minutes Intravenous Once per day on Mon Wed Fri 09/02/21 0836 09/28/21 0919   08/31/21 0745  ceFAZolin (ANCEF) IVPB 2g/100 mL premix        2 g 200 mL/hr over 30 Minutes Intravenous To Short Stay 08/31/21 0730 08/31/21 0918   08/30/21 0600  ceFAZolin (ANCEF) IVPB 2g/100 mL premix  Status:  Discontinued        2 g 200 mL/hr over 30 Minutes Intravenous To Short Stay 08/30/21 0123 08/31/21 0600   08/29/21 2000  DAPTOmycin (CUBICIN) 900 mg in sodium chloride 0.9 % IVPB  Status:  Discontinued        10 mg/kg  92.4 kg 136 mL/hr over 30 Minutes Intravenous Every 48 hours 08/29/21 1627 09/02/21 0836   08/28/21 1800  ceFAZolin (ANCEF) IVPB 2g/100 mL premix  Status:  Discontinued        2 g 200 mL/hr over 30 Minutes Intravenous Every M-W-F (1800) 08/27/21 0846 08/29/21 1620   08/28/21 1200  ceFAZolin (ANCEF) IVPB 1 g/50 mL premix  Status:  Discontinued        1 g 100 mL/hr over 30 Minutes Intravenous Every M-W-F (Hemodialysis) 08/26/21 1954 08/27/21 0846   08/28/21 0600   ceFAZolin (ANCEF) IVPB 2g/100 mL premix        2 g 200 mL/hr over 30 Minutes Intravenous On call to O.R. 08/27/21 1150 08/28/21 0909   08/27/21 1800  ceFAZolin (ANCEF) IVPB 1 g/50 mL premix        1 g 100 mL/hr over 30 Minutes Intravenous  Once 08/26/21 1954 08/27/21 2048   08/26/21 1500  ceFEPIme (MAXIPIME) 1 g in sodium chloride 0.9 % 100 mL IVPB  Status:  Discontinued  1 g 200 mL/hr over 30 Minutes Intravenous Every 24 hours 08/26/21 1452 08/26/21 1714          Medications  (feeding supplement) PROSource Plus  30 mL Oral BID BM   alosetron  1 mg Oral BID   amitriptyline  25 mg Oral QHS   amLODipine  10 mg Oral Q24H   atorvastatin  40 mg Oral q1800   carvedilol  6.25 mg Oral BID   Chlorhexidine Gluconate Cloth  6 each Topical Q0600   cyclobenzaprine  10 mg Oral TID   docusate sodium  200 mg Oral BID   DULoxetine  60 mg Oral Daily   famotidine  20 mg Oral Daily   furosemide  80 mg Oral Daily   heparin  5,000 Units Subcutaneous Q8H   hydrALAZINE  25 mg Oral TID   insulin aspart  0-9 Units Subcutaneous TID WC   insulin aspart  4 Units Subcutaneous TID WC   insulin glargine-yfgn  7 Units Subcutaneous QHS   multivitamin  1 tablet Oral QHS   pantoprazole  80 mg Oral Daily   polyethylene glycol  17 g Oral Daily   sevelamer carbonate  2,400 mg Oral TID WC   sodium zirconium cyclosilicate  10 g Oral Once per day on Sun Tue Thu Sat      Subjective:   [name] was seen and examined today.  Pain is better controlled today.  No acute issues.  States she cannot take Lyrica.  Objective:   Vitals:   12/09/21 1200 12/09/21 1207 12/09/21 1214 12/09/21 1230  BP: (!) 149/60  (!) 133/55 (!) 152/57  Pulse: (!) 56 (!) 57 (!) 56 (!) 56  Resp: (!) 22 11 (!) 9 14  Temp: 97.8 F (36.6 C)     TempSrc: Oral     SpO2: 96% 97% 99% 97%  Weight: 93.8 kg     Height:        Intake/Output Summary (Last 24 hours) at 12/09/2021 1259 Last data filed at 12/09/2021 1057 Gross per  24 hour  Intake 955 ml  Output 300 ml  Net 655 ml     Wt Readings from Last 3 Encounters:  12/09/21 93.8 kg  08/23/21 96.5 kg  08/07/21 101.1 kg   Physical Exam General: Alert and oriented x 3, NAD Cardiovascular: S1 S2 clear, RRR.  Respiratory: CTAB, no wheezing Gastrointestinal: Soft, nontender, nondistended, NBS Ext: left BKA Neuro: no new deficits Psych: Normal affect and demeanor    Data Reviewed:  I have personally reviewed following labs    CBC Lab Results  Component Value Date   WBC 6.7 12/09/2021   RBC 3.67 (L) 12/09/2021   HGB 10.1 (L) 12/09/2021   HCT 30.2 (L) 12/09/2021   MCV 82.3 12/09/2021   MCH 27.5 12/09/2021   PLT 180 12/09/2021   MCHC 33.4 12/09/2021   RDW 15.1 12/09/2021   LYMPHSABS 2.1 12/06/2021   MONOABS 1.0 12/06/2021   EOSABS 0.2 12/06/2021   BASOSABS 0.1 62/69/4854     Last metabolic panel Lab Results  Component Value Date   NA 127 (L) 12/09/2021   K 5.2 (H) 12/09/2021   CL 88 (L) 12/09/2021   CO2 26 12/09/2021   BUN 81 (H) 12/09/2021   CREATININE 5.62 (H) 12/09/2021   GLUCOSE 195 (H) 12/09/2021   GFRNONAA 8 (L) 12/09/2021   GFRAA 13 (L) 12/17/2018   CALCIUM 9.3 12/09/2021   PHOS 5.4 (H) 12/09/2021   PROT 7.6 12/02/2021   ALBUMIN  3.3 (L) 12/09/2021   LABGLOB 3.1 10/29/2018   AGRATIO 1.4 10/29/2018   BILITOT 0.6 12/02/2021   ALKPHOS 105 12/02/2021   AST 16 12/02/2021   ALT 15 12/02/2021   ANIONGAP 13 12/09/2021    CBG (last 3)  Recent Labs    12/08/21 1609 12/08/21 2115 12/09/21 0724  GLUCAP 192* 155* 119*      Coagulation Profile: No results for input(s): "INR", "PROTIME" in the last 168 hours.   Radiology Studies: I have personally reviewed the imaging studies  No results found.     Estill Cotta M.D. [facility] 12/09/2021, 12:59 PM  Available via Epic secure chat 7am-7pm After 7 pm, please refer to night coverage provider listed on amion.

## 2021-12-10 DIAGNOSIS — F419 Anxiety disorder, unspecified: Secondary | ICD-10-CM | POA: Diagnosis not present

## 2021-12-10 DIAGNOSIS — M86252 Subacute osteomyelitis, left femur: Secondary | ICD-10-CM | POA: Diagnosis not present

## 2021-12-10 DIAGNOSIS — N186 End stage renal disease: Secondary | ICD-10-CM | POA: Diagnosis not present

## 2021-12-10 DIAGNOSIS — K2100 Gastro-esophageal reflux disease with esophagitis, without bleeding: Secondary | ICD-10-CM | POA: Diagnosis not present

## 2021-12-10 LAB — GLUCOSE, CAPILLARY
Glucose-Capillary: 184 mg/dL — ABNORMAL HIGH (ref 70–99)
Glucose-Capillary: 167 mg/dL — ABNORMAL HIGH (ref 70–99)
Glucose-Capillary: 147 mg/dL — ABNORMAL HIGH (ref 70–99)
Glucose-Capillary: 92 mg/dL (ref 70–99)

## 2021-12-10 MED ORDER — OXYCODONE HCL 5 MG PO TABS
15.0000 mg | ORAL_TABLET | ORAL | Status: DC | PRN
  Administered 2021-12-10 – 2021-12-29 (×77): 15 mg via ORAL
  Filled 2021-12-10 (×78): qty 3

## 2021-12-10 MED ORDER — ALTEPLASE 2 MG IJ SOLR: 2.0000 mg | Freq: Once | INTRAMUSCULAR | Status: DC | PRN

## 2021-12-10 MED ORDER — HEPARIN SODIUM (PORCINE) 1000 UNIT/ML DIALYSIS
4000.0000 [IU] | Freq: Once | INTRAMUSCULAR | Status: AC
  Administered 2021-12-11: 4000 [IU] via INTRAVENOUS_CENTRAL
  Filled 2021-12-10: qty 4

## 2021-12-10 MED ORDER — FENTANYL 12 MCG/HR TD PT72
1.0000 | MEDICATED_PATCH | TRANSDERMAL | Status: DC
  Administered 2021-12-10 – 2021-12-13 (×2): 1 via TRANSDERMAL
  Filled 2021-12-10 (×2): qty 1

## 2021-12-10 MED ORDER — CHLORHEXIDINE GLUCONATE CLOTH 2 % EX PADS: 6.0000 | MEDICATED_PAD | Freq: Every day | CUTANEOUS | Status: DC

## 2021-12-10 MED ORDER — PENTAFLUOROPROP-TETRAFLUOROETH EX AERO: 1.0000 | INHALATION_SPRAY | CUTANEOUS | Status: DC | PRN

## 2021-12-10 MED ORDER — HEPARIN SODIUM (PORCINE) 1000 UNIT/ML DIALYSIS: 1000.0000 [IU] | INTRAMUSCULAR | Status: DC | PRN

## 2021-12-10 NOTE — Progress Notes (Signed)
Palliative:  Palliative has been shadowing chart. Complicated situation but no acute changes indicating palliative re-engagement. Patient goals have been clear to continue full scope. Primary team addressing pain concerns at this time. Progression in ability to dialyze in chair to enable discharge stalled due to patient self limiting and unwilling to participate due to pain and likely anticipation of pain. Palliative does not have the answer to path forward for Ms. Lilia Pro. Will sign off. Please re-consult if change in goals or dialysis no longer an option for her.   No charge  Vinie Sill, NP Palliative Medicine Team Pager [phone] (Please see [email or web address] for schedule) Team Phone [phone]

## 2021-12-10 NOTE — Progress Notes (Addendum)
[facility]                                                                              [name], is a 56 y.o. female, DOB - 1965-09-09, OAC:[id number] Admit date - 08/26/2021    Outpatient Primary MD for the patient is Donna Mariscal, MD  LOS - 106  days  Chief Complaint  Patient presents with   Wound Infection       Brief summary   Patient is a 56 year old female with HTN, HLD, IDDM, ESRD on HD MWF, left BKA 2021, 01/12/2020 for chronic calcaneus osteomyelitis, bipolar disorder presented with fall and wound infection.  Patient fell while trying to transfer from wheelchair to recliner on 5/19 and was found to have distal femoral fracture, deemed nonoperative.  She was admitted 6/3-6/23 and underwent partial resection of femur for osteomyelitis of infected soft tissue and rearrangement of bony fragments, was on antibiotics as guided by ID with cultures growing Staph aureus.  Patient presented secondary to copious left hip bleeding and found to have evidence of subacute osteomyelitis and abscess of left femur.  Orthopedics/ID was consulted.  Patient was started on empiric antibiotics and underwent multiple I&D's by Dr. [name] on 0/10, had application of Kerecis micro powder 7/1 with local tissue rearrangement.  She was also seen by vascular surgery, did with fistula branch ligation, completed 6 weeks course of daptomycin on 10/09/2021.  Discharge pending due to inability to tolerate HD in a chair, complicated by patient's refusal/inability to sit in a chair secondary to pain.  She tolerated HD in chair on 9/4.  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 [name] 1 week after discharge -Left knee reimaging on 10/2 showed the fracture line still visible but callus formation  - TOC and renal navigator assisting with placement issues, has been denied for stretcher HD at local [place] system clinic and no other clinic has approved either at this time -States pain is not well controlled with oral Dilaudid, discontinued.  Resumed oxycodone (per patient, was working better).  Refuses PT or sit in the chair due to pain. D/w pharmacy, will place on low-dose fentanyl patch 3mcg. -Awaiting placement   Active Problems:   ESRD (end stage renal disease) on dialysis ([place]), MWF -Continue HD per nephrology, still on Lasix -Has to do HD chair for outpatient hemodialysis, complicated by patient's refusal/inability to sit in a chair    Type 2 diabetes mellitus with hyperlipidemia ([place]) -Continue SSI, Lantus 7 units nightly, meal coverage 4 units 3 times daily AC -CBG stable Recent Labs    12/08/21 1609 12/08/21 2115 12/09/21 0724 12/09/21 1751 12/09/21 2140 12/10/21 0740  GLUCAP 192* 155* 119* 173* 181* 184*      History of left intertrochanteric fracture -Previously managed with IM nail placement in 2021, hardware removed  secondary to #1  Hyponatremia, hyperkalemia, hypomagnesemia -Electrolytes to be managed with HD and Lokelma as needed for hyperkalemia  ABLA superimposed on anemia of chronic disease -Hemoglobin 10.1, stable    History of CVA (cerebrovascular accident) -Continue Lipitor  Primary hypertension -Continue amlodipine, hydralazine, Coreg  Right knee pain -Likely osteoarthritis, x-ray from 9/19 negative for any fracture   GERD -Continue Pepcid, Protonix  Anxiety, depression -Continue amitriptyline, Cymbalta   Code Status: Full code DVT Prophylaxis:  Place TED hose Start: 09/06/21 1507 SCDs Start: 08/31/21 1058 heparin injection 5,000 Units Start: 08/26/21 1615   Level of Care: Level of care:  Med-Surg Family Communication: Updated patieny  Disposition Plan:      Remains inpatient appropriate: Difficult placement due to inability to sit in the chair for HD   Procedures:  Hemodialysis  Consultants:   Nephrology Orthopedics Discussed surgery  Antimicrobials:   Anti-infectives (From admission, onward)    Start     Dose/Rate Route Frequency Ordered Stop   10/02/21 2000  DAPTOmycin (CUBICIN) 900 mg in sodium chloride 0.9 % IVPB  Status:  Discontinued        10 mg/kg  92.4 kg 136 mL/hr over 30 Minutes Intravenous Once per day on Mon Wed Fri 10/01/21 1350 10/12/21 1146   09/30/21 2000  DAPTOmycin (CUBICIN) 900 mg in sodium chloride 0.9 % IVPB  Status:  Discontinued        10 mg/kg  92.4 kg 136 mL/hr over 30 Minutes Intravenous Daily 09/28/21 0919 10/01/21 1350   09/28/21 2000  DAPTOmycin (CUBICIN) 900 mg in sodium chloride 0.9 % IVPB        900 mg 136 mL/hr over 30 Minutes Intravenous  Once 09/28/21 0919 09/28/21 2146   09/02/21 2000  DAPTOmycin (CUBICIN) 900 mg in sodium chloride 0.9 % IVPB  Status:  Discontinued        10 mg/kg  92.4 kg 136 mL/hr over 30 Minutes Intravenous Once per day on Mon Wed Fri 09/02/21 0836 09/28/21 0919   08/31/21 0745  ceFAZolin (ANCEF) IVPB 2g/100 mL premix        2 g 200 mL/hr over 30 Minutes Intravenous To Short Stay 08/31/21 0730 08/31/21 0918   08/30/21 0600  ceFAZolin (ANCEF) IVPB 2g/100 mL premix  Status:  Discontinued        2 g 200 mL/hr over 30 Minutes Intravenous To Short Stay 08/30/21 0123 08/31/21 0600   08/29/21 2000  DAPTOmycin (CUBICIN) 900 mg in sodium chloride 0.9 % IVPB  Status:  Discontinued        10 mg/kg  92.4 kg 136 mL/hr over 30 Minutes Intravenous Every 48 hours 08/29/21 1627 09/02/21 0836   08/28/21 1800  ceFAZolin (ANCEF) IVPB 2g/100 mL premix  Status:  Discontinued        2 g 200 mL/hr over 30 Minutes Intravenous Every M-W-F (1800) 08/27/21 0846 08/29/21 1620   08/28/21 1200  ceFAZolin (ANCEF) IVPB 1 g/50 mL  premix  Status:  Discontinued        1 g 100 mL/hr over 30 Minutes Intravenous Every M-W-F (Hemodialysis) 08/26/21 1954 08/27/21 0846   08/28/21 0600  ceFAZolin (ANCEF) IVPB 2g/100 mL premix        2 g 200 mL/hr over 30 Minutes Intravenous On call to O.R. 08/27/21 1150 08/28/21 0909   08/27/21 1800  ceFAZolin (ANCEF) IVPB 1 g/50 mL premix        1 g 100 mL/hr over 30 Minutes Intravenous  Once 08/26/21 1954 08/27/21 2048  08/26/21 1500  ceFEPIme (MAXIPIME) 1 g in sodium chloride 0.9 % 100 mL IVPB  Status:  Discontinued        1 g 200 mL/hr over 30 Minutes Intravenous Every 24 hours 08/26/21 1452 08/26/21 1714          Medications  (feeding supplement) PROSource Plus  30 mL Oral BID BM   alosetron  1 mg Oral BID   amitriptyline  25 mg Oral QHS   amLODipine  10 mg Oral Q24H   atorvastatin  40 mg Oral q1800   carvedilol  6.25 mg Oral BID   Chlorhexidine Gluconate Cloth  6 each Topical Q0600   cyclobenzaprine  10 mg Oral TID   docusate sodium  200 mg Oral BID   DULoxetine  60 mg Oral Daily   famotidine  20 mg Oral Daily   furosemide  80 mg Oral Daily   heparin  5,000 Units Subcutaneous Q8H   hydrALAZINE  25 mg Oral TID   insulin aspart  0-9 Units Subcutaneous TID WC   insulin aspart  4 Units Subcutaneous TID WC   insulin glargine-yfgn  7 Units Subcutaneous QHS   multivitamin  1 tablet Oral QHS   pantoprazole  80 mg Oral Daily   polyethylene glycol  17 g Oral Daily   sevelamer carbonate  2,400 mg Oral TID WC   sodium zirconium cyclosilicate  10 g Oral Once per day on Sun Tue Thu Sat      Subjective:   Pacey Willadsen was seen and examined today.  States oral Dilaudid is not helping her pain and oxycodone was better.  Refuses to work with PT or sit up in the chair for HD, delaying progress or placement.   Objective:   Vitals:   12/09/21 1730 12/09/21 2143 12/10/21 0509 12/10/21 0809  BP: (!) 131/57 (!) 117/54 (!) 126/52 122/60  Pulse: 64 63 (!) 59 64  Resp: 18 18 18  17   Temp: 97.8 F (36.6 C) 98.4 F (36.9 C) 98 F (36.7 C) 98.1 F (36.7 C)  TempSrc: Oral Oral Oral   SpO2: 98% 99% 99% 100%  Weight:      Height:        Intake/Output Summary (Last 24 hours) at 12/10/2021 1114 Last data filed at 12/10/2021 0843 Gross per 24 hour  Intake 1230 ml  Output 5000 ml  Net -3770 ml     Wt Readings from Last 3 Encounters:  12/09/21 88.8 kg  08/23/21 96.5 kg  08/07/21 101.1 kg   Physical Exam General: Alert and oriented x 3, NAD Cardiovascular: S1 S2 clear, RRR.  Respiratory: CTAB, no wheezing, rales Gastrointestinal: Soft, NT, ND, NBS Ext: left BKA Neuro: no new deficits Psych: Normal affect     Data Reviewed:  I have personally reviewed following labs    CBC Lab Results  Component Value Date   WBC 6.7 12/09/2021   RBC 3.67 (L) 12/09/2021   HGB 10.1 (L) 12/09/2021   HCT 30.2 (L) 12/09/2021   MCV 82.3 12/09/2021   MCH 27.5 12/09/2021   PLT 180 12/09/2021   MCHC 33.4 12/09/2021   RDW 15.1 12/09/2021   LYMPHSABS 2.1 12/06/2021   MONOABS 1.0 12/06/2021   EOSABS 0.2 12/06/2021   BASOSABS 0.1 36/14/4315     Last metabolic panel Lab Results  Component Value Date   NA 127 (L) 12/09/2021   K 5.2 (H) 12/09/2021   CL 88 (L) 12/09/2021   CO2 26 12/09/2021   BUN 81 (  H) 12/09/2021   CREATININE 5.62 (H) 12/09/2021   GLUCOSE 195 (H) 12/09/2021   GFRNONAA 8 (L) 12/09/2021   GFRAA 13 (L) 12/17/2018   CALCIUM 9.3 12/09/2021   PHOS 5.4 (H) 12/09/2021   PROT 7.6 12/02/2021   ALBUMIN 3.3 (L) 12/09/2021   LABGLOB 3.1 10/29/2018   AGRATIO 1.4 10/29/2018   BILITOT 0.6 12/02/2021   ALKPHOS 105 12/02/2021   AST 16 12/02/2021   ALT 15 12/02/2021   ANIONGAP 13 12/09/2021    CBG (last 3)  Recent Labs    12/09/21 1751 12/09/21 2140 12/10/21 0740  GLUCAP 173* 181* 184*      Coagulation Profile: No results for input(s): "INR", "PROTIME" in the last 168 hours.   Radiology Studies: I have personally reviewed the imaging studies   No results found.     Estill Cotta M.D. [facility] 12/10/2021, 11:14 AM  Available via Epic secure chat 7am-7pm After 7 pm, please refer to night coverage provider listed on amion.

## 2021-12-10 NOTE — Progress Notes (Signed)
Physical Therapy Treatment Patient Details Name: [name] MRN: [id number] DOB: 1965/04/21 Today's Date: 12/10/2021   History of Present Illness 56 y.o. female presented to ED 6/26 from dialysis with increased bloody drainage from L hip wound. 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 s/p hardware removal of left hip with partial resection of tuft tissue and femure that were nonviable on 08/10/21 Discharged home 6/23. underwent L hip debridement and wound vac placement 6/28; +Left intertrochanteric non-union,  Fracture of left distal femur  PMH: hypertension, hyperlipidemia, ESRD on HD MWF, history of left BKA in 2021, depression/anxiety, stroke, tobacco use, T2DM,  insomnia, chronic pain syndrome,    PT Comments    Majority of session involved educating patient on her lack of progress over past two weeks as she has refused to work on increasing her sitting time in the chair (as she had agreed to previously during goal update). She is upset because she feels her pain is not being controlled well enough for her to work on this goal. She reports her pain is 8/10 and her goal is 5/10. Earlier today, discussed with Dr. Tana Coast patient's continued limitations due to pain and she made adjustments to her pain medication today. Unfortunately, her PT session occurred only 15 minutes after receiving the new pain medication and was not able to accurately assess if this will improve her pain. Discussed goal update for next two weeks and pt agreed again to work on improving her sitting tolerance in recliner with sliding board transfers to/from the chair. She agreed to work with PT on her non-HD days (T-Th). Remainder of session focused on LLE PROM/stretching. Made bolster for pt to use along left lateral thigh for prolonged IR stretch to left hip.     Recommendations for follow up therapy are one component of a multi-disciplinary discharge planning process, led by the attending  physician.  Recommendations may be updated based on patient status, additional functional criteria and insurance authorization.  Follow Up Recommendations  Skilled nursing-short term rehab (<3 hours/day) (pt currently refusing due to inability to tolerate sitting in chair for 6+ hours as transported to/from dialysis) Can patient physically be transported by private vehicle: No   Assistance Recommended at Discharge Intermittent Supervision/Assistance  Patient can return home with the following A lot of help with bathing/dressing/bathroom;Assistance with cooking/housework;Assist for transportation;Two people to help with walking and/or transfers;Help with stairs or ramp for entrance   Equipment Recommendations  None recommended by PT    Recommendations for Other Services       Precautions / Restrictions Precautions Precautions: Fall;Other (comment) Precaution Comments: L BKA (baseline) Restrictions Weight Bearing Restrictions: Yes LLE Weight Bearing: Non weight bearing Other Position/Activity Restrictions: L distal femur fx; per Dr [name] 8/10 pt can full WB     Mobility  Bed Mobility               General bed mobility comments: deferred due to lengthy conversation re: lack of progress toward recent goals; need to work on extending time she sits in the chair and progress needs to be made in next two weeks toward goals or will be discharged from PT    Transfers                        Ambulation/Gait                   Stairs  Wheelchair Mobility    Modified Rankin (Stroke Patients Only)       Balance                                            Cognition Arousal/Alertness: Awake/alert Behavior During Therapy:  (tearful) Overall Cognitive Status: Impaired/Different from baseline Area of Impairment: Awareness                         Safety/Judgement: Decreased awareness of deficits, Decreased awareness  of safety Awareness: Emergent            Exercises Other Exercises Other Exercises: left knee contract-relax stretching into extension. Continues to lack ~45 degrees extension. Will measure next visit Other Exercises: left hip contract-relax into internal rotation with pt lacking ~45 degrees from neutral. Will measure next visit    General Comments        Pertinent Vitals/Pain Pain Assessment Pain Assessment: 0-10 Faces Pain Scale: Hurts whole lot Pain Location: left hip primarily Pain Descriptors / Indicators: Discomfort, Grimacing, Guarding Pain Intervention(s): Limited activity within patient's tolerance, Monitored during session, Premedicated before session    Home Living Family/patient expects to be discharged to:: Private residence Living Arrangements: Children Available Help at Discharge: Family;Available PRN/intermittently Type of Home: Mobile home Home Access: Ramped entrance       Home Layout: One level Home Equipment: Wheelchair - manual;BSC/3in1;Rollator (4 wheels)      Prior Function            PT Goals (current goals can now be found in the care plan section) Acute Rehab PT Goals Patient Stated Goal: less pain with movement PT Goal Formulation: With patient Time For Goal Achievement: 12/24/21 Potential to Achieve Goals: Fair Progress towards PT goals: Not progressing toward goals - comment (lengthy discussion with pt and goals updated)    Frequency    Min 2X/week      PT Plan Current plan remains appropriate    Co-evaluation              AM-PAC PT "6 Clicks" Mobility   Outcome Measure  Help needed turning from your back to your side while in a flat bed without using bedrails?: None Help needed moving from lying on your back to sitting on the side of a flat bed without using bedrails?: None Help needed moving to and from a bed to a chair (including a wheelchair)?: A Little Help needed standing up from a chair using your arms (e.g.,  wheelchair or bedside chair)?: Total Help needed to walk in hospital room?: Total Help needed climbing 3-5 steps with a railing? : Total 6 Click Score: 14    End of Session Equipment Utilized During Treatment: Gait belt Activity Tolerance: Patient limited by pain Patient left: in bed;with call bell/phone within reach Nurse Communication: Mobility status PT Visit Diagnosis: Other abnormalities of gait and mobility (R26.89);Muscle weakness (generalized) (M62.81);History of falling (Z91.81);Pain Pain - Right/Left: Left Pain - part of body: Leg     Time: 1532-1610 PT Time Calculation (min) (ACUTE ONLY): 38 min  Charges:  $Therapeutic Exercise: 8-22 mins $Self Care/Home Management: [place], PT Acute Rehabilitation Services  Office [phone]    [name] 12/10/2021, 4:44 PM

## 2021-12-10 NOTE — Progress Notes (Signed)
[facility] Progress Note   Subjective:    Seen and examined patient at bedside. She tolerated yesterday's HD with net UF 5L; however, she reports cramping in her L hand. Next HD 10/11 in the chair.  Objective Vitals:   12/09/21 1730 12/09/21 2143 12/10/21 0509 12/10/21 0809  BP: (!) 131/57 (!) 117/54 (!) 126/52 122/60  Pulse: 64 63 (!) 59 64  Resp: 18 18 18 17   Temp: 97.8 F (36.6 C) 98.4 F (36.9 C) 98 F (36.7 C) 98.1 F (36.7 C)  TempSrc: Oral Oral Oral   SpO2: 98% 99% 99% 100%  Weight:      Height:       Physical Exam General:WDWN female in NAD Heart:RRR, no mrg Lungs:CTAB, nml WOB on RA Abdomen:soft, NTND Extremities:1+ RLE edema, trace L stump edema, L BKA Dialysis Access: LU AVF +thrill   Filed Weights   12/07/21 1232 12/09/21 1200 12/09/21 1650  Weight: 90.3 kg 93.8 kg 88.8 kg    Intake/Output Summary (Last 24 hours) at 12/10/2021 1343 Last data filed at 12/10/2021 0843 Gross per 24 hour  Intake 1230 ml  Output 5000 ml  Net -3770 ml    Additional Objective Labs: Basic Metabolic Panel: Recent Labs  Lab 12/06/21 0343 12/07/21 0703 12/09/21 1214  NA 132* 130* 127*  K 4.1 4.5 5.2*  CL 91* 90* 88*  CO2 31 26 26   GLUCOSE 146* 125* 195*  BUN 47* 79* 81*  CREATININE 4.32* 5.58* 5.62*  CALCIUM 9.7 9.2 9.3  PHOS 4.7* 5.7* 5.4*   Liver Function Tests: Recent Labs  Lab 12/06/21 0343 12/07/21 0703 12/09/21 1214  ALBUMIN 3.1* 3.1* 3.3*   No results for input(s): "LIPASE", "AMYLASE" in the last 168 hours. 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 Performed at [facility], [place] [address] [place], [place] [address]    REPTSTATUS 09/02/2021 FINAL 08/28/2021 2094    Cardiac Enzymes: No results for input(s): "CKTOTAL", "CKMB", "CKMBINDEX", "TROPONINI" in the last 168 hours. CBG: Recent Labs  Lab 12/09/21 0724 12/09/21 1751 12/09/21 2140 12/10/21 0740 12/10/21 1156  GLUCAP 119* 173* 181* 184* 167*   Iron Studies: No results for input(s): "IRON", "TIBC", "TRANSFERRIN", "FERRITIN" in the last 72 hours. Lab Results  Component Value Date   INR 1.1 12/28/2019   INR 1.2 12/17/2018   INR 0.99 04/07/2017   Studies/Results: No results found.  Medications:  methocarbamol (ROBAXIN) IV      (feeding supplement) PROSource Plus  30 mL Oral BID BM   alosetron  1 mg Oral BID   amitriptyline  25 mg Oral QHS   amLODipine  10 mg Oral Q24H   atorvastatin  40 mg Oral q1800   carvedilol  6.25 mg Oral BID   Chlorhexidine Gluconate Cloth  6 each Topical Q0600   cyclobenzaprine  10 mg Oral TID  docusate sodium  200 mg Oral BID   DULoxetine  60 mg Oral Daily   famotidine  20 mg Oral Daily   fentaNYL  1 patch Transdermal Q72H   furosemide  80 mg Oral Daily   heparin  5,000 Units Subcutaneous Q8H   hydrALAZINE  25 mg Oral TID   insulin aspart  0-9 Units Subcutaneous TID WC   insulin aspart  4 Units Subcutaneous TID WC   insulin glargine-yfgn  7 Units Subcutaneous QHS   multivitamin  1 tablet Oral QHS   pantoprazole  80 mg Oral Daily   polyethylene glycol  17 g Oral Daily   sevelamer carbonate  2,400 mg Oral TID WC   sodium zirconium cyclosilicate  10 g Oral Once per day on Sun Tue Thu Sat    Dialysis Orders: MWF at [facility], 400/500, EDW 90kg, 2K/2Ca, LUE AVF - heparin 4000 unit IV bolus - No ESA  Assessment/Plan: Recurrent L hip osteomyelitis: S/p  08/10/21 surgery (removal hip IMN which was placed  12/2019) with persistent VRE infection. Back to OR 08/28/21, 08/31/21 for L hip debridement and wound vac placement (now removed). Finished 6 week course of IV Daptomycin on 10/12/21. Management per primary team.  Chronic L hip/stump pain: She does have fractures of L femoral neck (where hardware was removed) as well as distal femur Fx. Pain control per primary.  ESRD: Continue HD on usual MWF.  Next HD 10/11.  Hyperkalemia: Recurrent issues, on 2K bath + furosemide. Lokelma 10g added on non-HD days -> now prn. K+ yesterday 5.2. Checking labs in AM. AVF dysfunction: S/p branch ligation in OR 09/19/2021 per Dr. Virl Cagey. AVF working well now. HTN/volume: BP controlled on amlodipine/hydralazine/Coreg. Ongoing edema and large gains.  Reinforced fluid and sodium restrictions. Getting ~ 4-5L off q HD.  Not quite to EDW.  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  No need for iron or ESA at present.  If Hgb continues to drop will start ESA. Checking labs in AM. Secondary HPTH: CorrCa slightly high, not on VDRA. Phos variable. Continue Renvela as binder. Nutrition: Alb low, continue protein supplements. DM2: Insulin per primary Dispo: SNF previously recommended, now she would like to go home on discharge even though HD unit has been clear that they are prohibited by company policy to assist patient out of her car and into the facility. Patient is aware of this - says she is going to work with transfer with PT.  Asks about home HD but they would NOT be able to train her since she is in the hospital. Dialyzed in recliner on 9/4 but signed off early due to pain, has declined to come in a recliner since then-I plan to have her in the chair for HD 10/11. Patient states: "I will try". Per Renal Navigator notes there are no clinics that will accept patient unless she will have HD in chair. Will continue to encourage use of chair for HD.   Tobie Poet, NP [facility] 12/10/2021,1:43 PM  LOS: 106 days

## 2021-12-11 DIAGNOSIS — K2100 Gastro-esophageal reflux disease with esophagitis, without bleeding: Secondary | ICD-10-CM | POA: Diagnosis not present

## 2021-12-11 DIAGNOSIS — N186 End stage renal disease: Secondary | ICD-10-CM | POA: Diagnosis not present

## 2021-12-11 DIAGNOSIS — F419 Anxiety disorder, unspecified: Secondary | ICD-10-CM | POA: Diagnosis not present

## 2021-12-11 DIAGNOSIS — M86252 Subacute osteomyelitis, left femur: Secondary | ICD-10-CM | POA: Diagnosis not present

## 2021-12-11 LAB — CBC
HCT: 31.7 % — ABNORMAL LOW (ref 36.0–46.0)
Hemoglobin: 10.8 g/dL — ABNORMAL LOW (ref 12.0–15.0)
MCH: 27.8 pg (ref 26.0–34.0)
MCHC: 34.1 g/dL (ref 30.0–36.0)
MCV: 81.7 fL (ref 80.0–100.0)
Platelets: 212 10*3/uL (ref 150–400)
RBC: 3.88 MIL/uL (ref 3.87–5.11)
RDW: 15.4 % (ref 11.5–15.5)
WBC: 7.1 10*3/uL (ref 4.0–10.5)
nRBC: 0 % (ref 0.0–0.2)

## 2021-12-11 LAB — BASIC METABOLIC PANEL
Anion gap: 14 (ref 5–15)
BUN: 76 mg/dL — ABNORMAL HIGH (ref 6–20)
CO2: 25 mmol/L (ref 22–32)
Calcium: 9.8 mg/dL (ref 8.9–10.3)
Chloride: 87 mmol/L — ABNORMAL LOW (ref 98–111)
Creatinine, Ser: 5.32 mg/dL — ABNORMAL HIGH (ref 0.44–1.00)
GFR, Estimated: 9 mL/min — ABNORMAL LOW (ref >60)
Glucose, Bld: 133 mg/dL — ABNORMAL HIGH (ref 70–99)
Potassium: 4.2 mmol/L (ref 3.5–5.1)
Sodium: 126 mmol/L — ABNORMAL LOW (ref 135–145)

## 2021-12-11 LAB — PHOSPHORUS: Phosphorus: 5.9 mg/dL — ABNORMAL HIGH (ref 2.5–4.6)

## 2021-12-11 LAB — GLUCOSE, CAPILLARY
Glucose-Capillary: 181 mg/dL — ABNORMAL HIGH (ref 70–99)
Glucose-Capillary: 259 mg/dL — ABNORMAL HIGH (ref 70–99)
Glucose-Capillary: 165 mg/dL — ABNORMAL HIGH (ref 70–99)

## 2021-12-11 NOTE — Progress Notes (Signed)
Pt off the unit in hemodialysis

## 2021-12-11 NOTE — Progress Notes (Signed)
[facility] Progress Note   Subjective:    Seen and examined on HD. No acute complaints. Tolerating UFG 4.5L.  Objective Vitals:   12/11/21 0743 12/11/21 0800 12/11/21 0830 12/11/21 0900  BP: (!) 141/59 (!) 132/56 133/60 (!) 129/56  Pulse: (!) 58 61 63 65  Resp: (!) 8 (!) 9 10 10   Temp:      TempSrc:      SpO2: 98% 97% 99% 96%  Weight:      Height:       Physical Exam General:WDWN female in NAD Heart:RRR, no mrg Lungs:CTAB, nml WOB on RA Abdomen:soft, NTND Extremities:1+ RLE edema, trace L stump edema, L BKA Dialysis Access: LU AVF +thrill   Filed Weights   12/09/21 1200 12/09/21 1650 12/11/21 0735  Weight: 93.8 kg 88.8 kg 90.8 kg    Intake/Output Summary (Last 24 hours) at 12/11/2021 0930 Last data filed at 12/11/2021 8850 Gross per 24 hour  Intake 520 ml  Output 0 ml  Net 520 ml    Additional Objective Labs: Basic Metabolic Panel: Recent Labs  Lab 12/07/21 0703 12/09/21 1214 12/11/21 0138  NA 130* 127* 126*  K 4.5 5.2* 4.2  CL 90* 88* 87*  CO2 26 26 25   GLUCOSE 125* 195* 133*  BUN 79* 81* 76*  CREATININE 5.58* 5.62* 5.32*  CALCIUM 9.2 9.3 9.8  PHOS 5.7* 5.4* 5.9*   Liver Function Tests: Recent Labs  Lab 12/06/21 0343 12/07/21 0703 12/09/21 1214  ALBUMIN 3.1* 3.1* 3.3*   No results for input(s): "LIPASE", "AMYLASE" in the last 168 hours. CBC: Recent Labs  Lab 12/05/21 0856 12/06/21 0343 12/07/21 0703 12/09/21 1214 12/11/21 0138  WBC 6.5 5.7 6.9 6.7 7.1  NEUTROABS  --  2.3  --   --   --   HGB 9.7* 10.5* 9.6* 10.1* 10.8*  HCT 29.4* 31.4* 30.2* 30.2* 31.7*  MCV 83.5 83.7 85.8 82.3 81.7  PLT 177 165 189 180 212   Blood Culture    Component Value Date/Time   SDES TISSUE 08/28/2021 [place] TISSUE 08/28/2021 0917   CULT  08/28/2021 0917    MODERATE ENTEROCOCCUS FAECIUM VANCOMYCIN RESISTANT ENTEROCOCCUS ISOLATED NO ANAEROBES ISOLATED Sent to [place] for further susceptibility testing. Performed  at [facility], [place] [address], [place], [place] [address]    REPTSTATUS 09/02/2021 FINAL 08/28/2021 2878    Cardiac Enzymes: No results for input(s): "CKTOTAL", "CKMB", "CKMBINDEX", "TROPONINI" in the last 168 hours. CBG: Recent Labs  Lab 12/09/21 2140 12/10/21 0740 12/10/21 1156 12/10/21 1642 12/10/21 2055  GLUCAP 181* 184* 167* 147* 92   Iron Studies: No results for input(s): "IRON", "TIBC", "TRANSFERRIN", "FERRITIN" in the last 72 hours. Lab Results  Component Value Date   INR 1.1 12/28/2019   INR 1.2 12/17/2018   INR 0.99 04/07/2017   Studies/Results: No results found.  Medications:  methocarbamol (ROBAXIN) IV      (feeding supplement) PROSource Plus  30 mL Oral BID BM   alosetron  1 mg Oral BID   amitriptyline  25 mg Oral QHS   amLODipine  10 mg Oral Q24H   atorvastatin  40 mg Oral q1800   carvedilol  6.25 mg Oral BID   Chlorhexidine Gluconate Cloth  6 each Topical Q0600   cyclobenzaprine  10 mg Oral TID   docusate sodium  200 mg Oral BID   DULoxetine  60 mg Oral Daily   famotidine  20 mg Oral Daily   fentaNYL  1 patch Transdermal Q72H   furosemide  80 mg Oral Daily   hydrALAZINE  25 mg Oral TID   insulin aspart  0-9 Units Subcutaneous TID WC   insulin aspart  4 Units Subcutaneous TID WC   insulin glargine-yfgn  7 Units Subcutaneous QHS   multivitamin  1 tablet Oral QHS   pantoprazole  80 mg Oral Daily   polyethylene glycol  17 g Oral Daily   sevelamer carbonate  2,400 mg Oral TID WC   sodium zirconium cyclosilicate  10 g Oral Once per day on Sun Tue Thu Sat    Dialysis Orders: MWF at [facility], 400/500, EDW 90kg, 2K/2Ca, LUE AVF - heparin 4000 unit IV bolus - No ESA  Assessment/Plan: Recurrent L hip osteomyelitis: S/p 08/10/21 surgery (removal hip IMN which was placed  12/2019) with persistent VRE infection. Back to OR 08/28/21, 08/31/21 for L hip debridement and wound vac placement (now removed). Finished 6 week course of IV Daptomycin  on 10/12/21. Management per primary team.  Chronic L hip/stump pain: She does have fractures of L femoral neck (where hardware was removed) as well as distal femur Fx. Pain control per primary.  ESRD: Continue HD on usual MWF. On HD. Pulled off 5L last treatment but she reported L hand cramping. [place] set for 4.5L today. Hyperkalemia: Recurrent issues, on 2K bath + furosemide. Lokelma 10g added on non-HD days -> now prn. K+ 4.2. AVF dysfunction: S/p branch ligation in OR 09/19/2021 per Dr. Virl Cagey. AVF working well now. HTN/volume: BP controlled on amlodipine/hydralazine/Coreg. Ongoing edema and large gains.  Reinforced fluid and sodium restrictions. Getting ~ 4-5L off q HD.  Not quite to EDW.  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  No need for iron or ESA at present.  If Hgb continues to drop will start ESA.  Secondary HPTH: CorrCa slightly high, not on VDRA. Phos variable. Continue Renvela as binder. Nutrition: Alb low, continue protein supplements. DM2: Insulin per primary Dispo: SNF previously recommended, now she would like to go home on discharge even though HD unit has been clear that they are prohibited by company policy to assist patient out of her car and into the facility. Patient is aware of this.  Asks about home HD but they would NOT be able to train her since she is in the hospital. Dialyzed in recliner on 9/4 but signed off early due to pain, has declined to come in a recliner since then. I was informed by PT yesterday (10/10) of patient being close to be discharged from PT service for lack of progress. PT and myself had a discussion with patient yesterday afternoon (10/10) at bedside about the situation. We again strongly discussed on the importance of increasing her mobility as much as possible while she is in her room in addition to following her ongoing fluid restriction. Also reinforced the importance of being in the chair for HD treatments to improve strength and mobility. Patient agrees for  the next 2-weeks to work extensively with PT in her room by increasing her time in the chair with each PT session. Our goal is to dialyze her in the mornings so the patient has more time to work with PT in the afternoons (we will do our best to accommodate this as we still need to always consider the emergent cases).  Per Renal Navigator notes there are no clinics that will accept patient unless she will have HD in chair. Will continue to encourage use of chair  for HD.   Donna Poet, NP [facility] 12/11/2021,9:30 AM  LOS: 107 days

## 2021-12-11 NOTE — Progress Notes (Signed)
[facility]                                                                              [name], is a 56 y.o. 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  Patient fell while trying to transfer from wheelchair to recliner on 5/19 and was found to have distal femoral fracture, deemed nonoperative.  She was admitted 6/3-6/23 and underwent partial resection of femur for osteomyelitis of infected soft tissue and rearrangement of bony fragments, was on antibiotics as guided by ID with cultures growing Staph aureus.  Patient presented secondary to copious left hip bleeding and found to have evidence of subacute osteomyelitis and abscess of left femur.  Orthopedics/ID was consulted.  Patient was started on empiric antibiotics and underwent multiple I&D's by Dr. [name] on 3/53, had application of Kerecis micro powder 7/1 with local tissue rearrangement.  She was also seen by vascular surgery, did with fistula branch ligation, completed 6 weeks course of daptomycin on 10/09/2021.  Discharge pending due to inability to tolerate HD in a chair, complicated by patient's refusal/inability to sit in a chair secondary to pain.  She tolerated HD in chair on 9/4.  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 [name] 1 week after discharge -Left knee reimaging on 10/2 showed the fracture line still visible but callus formation  - TOC and renal navigator assisting with placement issues, has been denied for stretcher HD at local [place] [facility] system clinic and no other clinic has approved either at this time -Started on fentanyl patch 12 mcg daily, continue oxycodone as needed for pain  -Awaiting placement   Active Problems:   ESRD (end stage renal disease) on dialysis ([place]), MWF -Continue HD per nephrology, still on Lasix -Has to do HD chair for outpatient hemodialysis, complicated by patient's refusal/inability to sit in a chair    Type 2 diabetes mellitus with hyperlipidemia ([place]) -Continue SSI, Lantus 7 units nightly, meal coverage 4 units 3 times daily AC -CBG stable    History of left intertrochanteric fracture -Previously managed with IM nail placement in 2021, hardware removed secondary to #1  Hyponatremia, hyperkalemia, hypomagnesemia -Electrolytes to be managed with HD and Lokelma as needed for hyperkalemia  ABLA superimposed on anemia of chronic disease -H&H stable 10.8    History of CVA (cerebrovascular accident) -Continue Lipitor  Primary hypertension -Continue amlodipine, hydralazine, Coreg  Right knee pain -  Likely osteoarthritis, x-ray from 9/19 negative for any fracture   GERD -Continue Pepcid, Protonix  Anxiety, depression -Continue amitriptyline, Cymbalta   Code Status: Full code DVT Prophylaxis:  Place TED hose Start: 09/06/21 1507 SCDs Start: 08/31/21 1058   Level of Care: Level of care: Med-Surg Family Communication: Updated patient  Disposition Plan:      Remains inpatient appropriate: Difficult placement due to inability to sit in the chair for HD   Procedures:  Hemodialysis  Consultants:   Nephrology Orthopedics Discussed surgery  Antimicrobials:   Anti-infectives (From admission, onward)    Start     Dose/Rate Route  Frequency Ordered Stop   10/02/21 2000  DAPTOmycin (CUBICIN) 900 mg in sodium chloride 0.9 % IVPB  Status:  Discontinued        10 mg/kg  92.4 kg 136 mL/hr over 30 Minutes Intravenous Once per day on Mon Wed Fri 10/01/21 1350 10/12/21 1146   09/30/21 2000  DAPTOmycin (CUBICIN) 900 mg in sodium chloride 0.9 % IVPB  Status:  Discontinued        10 mg/kg  92.4 kg 136 mL/hr over 30 Minutes Intravenous Daily 09/28/21 0919 10/01/21 1350   09/28/21 2000  DAPTOmycin (CUBICIN) 900 mg in sodium chloride 0.9 % IVPB        900 mg 136 mL/hr over 30 Minutes Intravenous  Once 09/28/21 0919 09/28/21 2146   09/02/21 2000  DAPTOmycin (CUBICIN) 900 mg in sodium chloride 0.9 % IVPB  Status:  Discontinued        10 mg/kg  92.4 kg 136 mL/hr over 30 Minutes Intravenous Once per day on Mon Wed Fri 09/02/21 0836 09/28/21 0919   08/31/21 0745  ceFAZolin (ANCEF) IVPB 2g/100 mL premix        2 g 200 mL/hr over 30 Minutes Intravenous To Short Stay 08/31/21 0730 08/31/21 0918   08/30/21 0600  ceFAZolin (ANCEF) IVPB 2g/100 mL premix  Status:  Discontinued        2 g 200 mL/hr over 30 Minutes Intravenous To Short Stay 08/30/21 0123 08/31/21 0600   08/29/21 2000  DAPTOmycin (CUBICIN) 900 mg in sodium chloride 0.9 % IVPB  Status:  Discontinued        10 mg/kg  92.4 kg 136 mL/hr over 30 Minutes Intravenous Every 48 hours 08/29/21 1627 09/02/21 0836   08/28/21 1800  ceFAZolin (ANCEF) IVPB 2g/100 mL premix  Status:  Discontinued        2 g 200 mL/hr over 30 Minutes Intravenous Every M-W-F (1800) 08/27/21 0846 08/29/21 1620   08/28/21 1200  ceFAZolin (ANCEF) IVPB 1 g/50 mL premix  Status:  Discontinued        1 g 100 mL/hr over 30 Minutes Intravenous Every M-W-F (Hemodialysis) 08/26/21 1954 08/27/21 0846   08/28/21 0600  ceFAZolin (ANCEF) IVPB 2g/100 mL premix        2 g 200 mL/hr over 30 Minutes Intravenous On call to O.R. 08/27/21 1150 08/28/21 0909   08/27/21 1800  ceFAZolin (ANCEF) IVPB 1 g/50 mL premix        1  g 100 mL/hr over 30 Minutes Intravenous  Once 08/26/21 1954 08/27/21 2048   08/26/21 1500  ceFEPIme (MAXIPIME) 1 g in sodium chloride 0.9 % 100 mL IVPB  Status:  Discontinued        1 g 200 mL/hr over 30 Minutes Intravenous Every 24 hours 08/26/21 1452 08/26/21 1714          Medications  (feeding supplement) PROSource Plus  30 mL Oral BID BM   alosetron  1 mg Oral BID   amitriptyline  25 mg Oral QHS   amLODipine  10 mg Oral Q24H   atorvastatin  40 mg Oral q1800   carvedilol  6.25 mg Oral BID   Chlorhexidine Gluconate Cloth  6 each Topical Q0600   cyclobenzaprine  10 mg Oral TID   docusate sodium  200 mg Oral BID   DULoxetine  60 mg Oral Daily   famotidine  20 mg Oral Daily   fentaNYL  1 patch Transdermal Q72H   furosemide  80 mg Oral Daily   hydrALAZINE  25 mg Oral TID   insulin aspart  0-9 Units Subcutaneous TID WC   insulin aspart  4 Units Subcutaneous TID WC   insulin glargine-yfgn  7 Units Subcutaneous QHS   multivitamin  1 tablet Oral QHS   pantoprazole  80 mg Oral Daily   polyethylene glycol  17 g Oral Daily   sevelamer carbonate  2,400 mg Oral TID WC   sodium zirconium cyclosilicate  10 g Oral Once per day on Sun Tue Thu Sat      Subjective:   [name] was seen and examined today.  Seen in HD, no acute complaints, states pain feels better with fentanyl patch and oxycodone.  No acute issues, no nausea vomiting, abdominal pain.  No fevers  Objective:   Vitals:   12/11/21 1136 12/11/21 1203 12/11/21 1217 12/11/21 1219  BP: (!) 93/53 (!) 98/59  134/64  Pulse: 68 69  82  Resp: 10 17  17   Temp:  98.1 F (36.7 C)  98.1 F (36.7 C)  TempSrc:      SpO2: 97% 97%  100%  Weight:   87.3 kg   Height:        Intake/Output Summary (Last 24 hours) at 12/11/2021 1254 Last data filed at 12/11/2021 1219 Gross per 24 hour  Intake 520 ml  Output 3970 ml  Net -3450 ml     Wt Readings from Last 3 Encounters:  12/11/21 87.3 kg  08/23/21 96.5 kg  08/07/21  101.1 kg    Physical Exam General: Alert and oriented x 3, NAD Cardiovascular: S1 S2 clear, RRR.  Respiratory: CTAB, no wheezing Gastrointestinal: Soft, nontender, nondistended, NBS Ext: left BKA Neuro: no new deficits Psych: Normal affect and demeanor, alert and oriented x3     Data Reviewed:  I have personally reviewed following labs    CBC Lab Results  Component Value Date   WBC 7.1 12/11/2021   RBC 3.88 12/11/2021   HGB 10.8 (L) 12/11/2021   HCT 31.7 (L) 12/11/2021   MCV 81.7 12/11/2021   MCH 27.8 12/11/2021   PLT 212 12/11/2021   MCHC 34.1 12/11/2021   RDW 15.4 12/11/2021   LYMPHSABS 2.1 12/06/2021   MONOABS 1.0 12/06/2021   EOSABS 0.2 12/06/2021   BASOSABS 0.1 94/49/6759     Last metabolic panel Lab Results  Component Value Date   NA 126 (L) 12/11/2021   K 4.2 12/11/2021   CL 87 (L) 12/11/2021   CO2 25 12/11/2021   BUN 76 (H) 12/11/2021   CREATININE 5.32 (H) 12/11/2021   GLUCOSE 133 (H) 12/11/2021   GFRNONAA 9 (L) 12/11/2021   GFRAA 13 (L) 12/17/2018   CALCIUM 9.8 12/11/2021   PHOS 5.9 (H) 12/11/2021   PROT 7.6 12/02/2021   ALBUMIN 3.3 (L) 12/09/2021   LABGLOB 3.1 10/29/2018   AGRATIO 1.4 10/29/2018   BILITOT 0.6 12/02/2021  ALKPHOS 105 12/02/2021   AST 16 12/02/2021   ALT 15 12/02/2021   ANIONGAP 14 12/11/2021    CBG (last 3)  Recent Labs    12/10/21 1156 12/10/21 1642 12/10/21 2055  GLUCAP 167* 147* 92      Coagulation Profile: No results for input(s): "INR", "PROTIME" in the last 168 hours.   Radiology Studies: I have personally reviewed the imaging studies  No results found.     Estill Cotta M.D. [facility] 12/11/2021, 12:54 PM  Available via Epic secure chat 7am-7pm After 7 pm, please refer to night coverage provider listed on amion.

## 2021-12-11 NOTE — Procedures (Signed)
HD Note:  Some information was entered later than the data was gathered due to patient care needs. The stated time with the data is accurate.   Received patient in bed to unit.  Alert and oriented.  Informed consent signed and in chart.   Patient rested and watched TV during most of the treatment.  Patient had cramping and nausea when she was 23 min to the end of treatment.  UF was stopped.  Patient given diet ginger ale and crackers.     Transported back to the room  Alert, without acute distress.  Hand-off given to patient's nurse.   Access used: AVF Access issues: none  Total UF removed: 3970 ml    [facility]

## 2021-12-12 DIAGNOSIS — M86252 Subacute osteomyelitis, left femur: Secondary | ICD-10-CM | POA: Diagnosis not present

## 2021-12-12 LAB — GLUCOSE, CAPILLARY
Glucose-Capillary: 158 mg/dL — ABNORMAL HIGH (ref 70–99)
Glucose-Capillary: 226 mg/dL — ABNORMAL HIGH (ref 70–99)
Glucose-Capillary: 170 mg/dL — ABNORMAL HIGH (ref 70–99)

## 2021-12-12 MED ORDER — PENTAFLUOROPROP-TETRAFLUOROETH EX AERO: 1.0000 | INHALATION_SPRAY | CUTANEOUS | Status: DC | PRN

## 2021-12-12 MED ORDER — ALTEPLASE 2 MG IJ SOLR: 2.0000 mg | Freq: Once | INTRAMUSCULAR | Status: DC | PRN

## 2021-12-12 MED ORDER — HEPARIN SODIUM (PORCINE) 1000 UNIT/ML DIALYSIS: 1000.0000 [IU] | INTRAMUSCULAR | Status: DC | PRN

## 2021-12-12 MED ORDER — CHLORHEXIDINE GLUCONATE CLOTH 2 % EX PADS: 6.0000 | MEDICATED_PAD | Freq: Every day | CUTANEOUS | Status: DC

## 2021-12-12 NOTE — Progress Notes (Signed)
Mobility Specialist Progress Note:   12/12/21 1300  Mobility  Activity Transferred from chair to bed  Level of Assistance Minimal assist, patient does 75% or more  Assistive Device Sliding board  Activity Response Tolerated well  Mobility Referral Yes  $Mobility charge 1 Mobility   Pt agreeable to transfer back to bed via sliding board. No physical assistance required after board placement. Pt left with all needs met.   Nelta Numbers [facility] Rehab Secure Chat or Office Phone: [phone]

## 2021-12-12 NOTE — Progress Notes (Signed)
[facility] Progress Note   Subjective:    Seen and examined patient at bedside. She reported having some cramping near end of treatment and UF was held for short period. Noted net UF 3.9L. She also reported working with PT today. Next HD 10/13.  Objective Vitals:   12/11/21 2137 12/12/21 0541 12/12/21 0856 12/12/21 1428  BP: (!) 106/47 (!) 125/51 (!) 118/54 (!) 113/59  Pulse: 62 64 66 62  Resp: 19 15 16 14   Temp: 98.9 F (37.2 C) 98.4 F (36.9 C)    TempSrc: Oral Oral    SpO2: 97% 99% 100% 97%  Weight:  88 kg    Height:       Physical Exam General: WDWN female in NAD Heart: RRR, no mrg Lungs: CTAB, nml WOB on RA Abdomen: Soft, NTND Extremities: 1+ RLE edema, trace L stump edema, L BKA Dialysis Access: LU AVF +thrill   Filed Weights   12/11/21 0735 12/11/21 1217 12/12/21 0541  Weight: 90.8 kg 87.3 kg 88 kg    Intake/Output Summary (Last 24 hours) at 12/12/2021 1503 Last data filed at 12/12/2021 1300 Gross per 24 hour  Intake 1335 ml  Output --  Net 1335 ml    Additional Objective Labs: Basic Metabolic Panel: Recent Labs  Lab 12/07/21 0703 12/09/21 1214 12/11/21 0138  NA 130* 127* 126*  K 4.5 5.2* 4.2  CL 90* 88* 87*  CO2 26 26 25   GLUCOSE 125* 195* 133*  BUN 79* 81* 76*  CREATININE 5.58* 5.62* 5.32*  CALCIUM 9.2 9.3 9.8  PHOS 5.7* 5.4* 5.9*   Liver Function Tests: Recent Labs  Lab 12/06/21 0343 12/07/21 0703 12/09/21 1214  ALBUMIN 3.1* 3.1* 3.3*   No results for input(s): "LIPASE", "AMYLASE" in the last 168 hours. CBC: Recent Labs  Lab 12/06/21 0343 12/07/21 0703 12/09/21 1214 12/11/21 0138  WBC 5.7 6.9 6.7 7.1  NEUTROABS 2.3  --   --   --   HGB 10.5* 9.6* 10.1* 10.8*  HCT 31.4* 30.2* 30.2* 31.7*  MCV 83.7 85.8 82.3 81.7  PLT 165 189 180 212   Blood Culture    Component Value Date/Time   SDES TISSUE 08/28/2021 0917   SPECREQUEST LEFT THIGH TISSUE 08/28/2021 0917   CULT  08/28/2021 0917    MODERATE ENTEROCOCCUS  FAECIUM VANCOMYCIN RESISTANT ENTEROCOCCUS ISOLATED NO ANAEROBES ISOLATED Sent to [place] for further susceptibility testing. Performed at [facility], [place] [address] [place], [place] [address]    REPTSTATUS 09/02/2021 FINAL 08/28/2021 9518    Cardiac Enzymes: No results for input(s): "CKTOTAL", "CKMB", "CKMBINDEX", "TROPONINI" in the last 168 hours. CBG: Recent Labs  Lab 12/11/21 1251 12/11/21 1706 12/11/21 2133 12/12/21 0726 12/12/21 1107  GLUCAP 181* 259* 165* 158* 226*   Iron Studies: No results for input(s): "IRON", "TIBC", "TRANSFERRIN", "FERRITIN" in the last 72 hours. Lab Results  Component Value Date   INR 1.1 12/28/2019   INR 1.2 12/17/2018   INR 0.99 04/07/2017   Studies/Results: No results found.  Medications:  methocarbamol (ROBAXIN) IV      (feeding supplement) PROSource Plus  30 mL Oral BID BM   alosetron  1 mg Oral BID   amitriptyline  25 mg Oral QHS   amLODipine  10 mg Oral Q24H   atorvastatin  40 mg Oral q1800   carvedilol  6.25 mg Oral BID   Chlorhexidine Gluconate Cloth  6 each Topical Q0600   cyclobenzaprine  10 mg Oral TID   docusate sodium  200 mg Oral BID   DULoxetine  60 mg Oral Daily   famotidine  20 mg Oral Daily   fentaNYL  1 patch Transdermal Q72H   furosemide  80 mg Oral Daily   hydrALAZINE  25 mg Oral TID   insulin aspart  0-9 Units Subcutaneous TID WC   insulin aspart  4 Units Subcutaneous TID WC   insulin glargine-yfgn  7 Units Subcutaneous QHS   multivitamin  1 tablet Oral QHS   pantoprazole  80 mg Oral Daily   polyethylene glycol  17 g Oral Daily   sevelamer carbonate  2,400 mg Oral TID WC   sodium zirconium cyclosilicate  10 g Oral Once per day on Sun Tue Thu Sat    Dialysis Orders: MWF at [facility], 400/500, EDW 90kg, 2K/2Ca, LUE AVF - heparin 4000 unit IV bolus - No ESA  Assessment/Plan: Recurrent L hip osteomyelitis: S/p 08/10/21 surgery (removal hip IMN which was placed  12/2019) with persistent  VRE infection. Back to OR 08/28/21, 08/31/21 for L hip debridement and wound vac placement (now removed). Finished 6 week course of IV Daptomycin on 10/12/21. Management per primary team.  Chronic L hip/stump pain: She does have fractures of L femoral neck (where hardware was removed) as well as distal femur Fx. Pain control per primary.  ESRD: Continue HD on usual MWF. She's been c/o mild cramping lately but still has swelling RLE and L stump. Pulled off 5L 10/9 and 3.9L yesterday. Next HD 10/13-UFG 4-5L as tolerated. Hyperkalemia: Recurrent issues, on 2K bath + furosemide. Lokelma 10g added on non-HD days -> now prn. K+ 4.2. AVF dysfunction: S/p branch ligation in OR 09/19/2021 per Dr. Virl Cagey. AVF working well now. HTN/volume: BP controlled on amlodipine/hydralazine/Coreg. Ongoing edema and large gains.  Reinforced fluid and sodium restrictions. Getting ~ 4-5L off q HD.  Not quite to EDW.  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  No need for iron or ESA at present.  If Hgb continues to drop will start ESA.  Secondary HPTH: CorrCa slightly high, not on VDRA. Phos variable. Continue Renvela as binder. Nutrition: Alb low, continue protein supplements. DM2: Insulin per primary Dispo: SNF previously recommended, now she would like to go home on discharge even though HD unit has been clear that they are prohibited by company policy to assist patient out of her car and into the facility. Patient is aware of this.  Asks about home HD but they would NOT be able to train her since she is in the hospital. Dialyzed in recliner on 9/4 but signed off early due to pain, has declined to come in a recliner since then. I was informed by PT on 10/10 of patient being close to be discharged from PT service for lack of progress. PT and myself had a discussion with patient on 10/10 at bedside about the situation. We again strongly discussed on the importance of increasing her mobility as much as possible while she is in her room in  addition to following her ongoing fluid restriction. Also reinforced the importance of being in the chair for HD treatments to improve strength and mobility. Patient agrees for the next 2-weeks to work extensively with PT in her room by increasing her time in the chair with each PT session. Today, PT reports patient was in the chair in her room for 1 hour. Our goal is to dialyze her in the mornings so the patient has more time to work with PT in the  afternoons (we will do our best to accommodate this as we still need to always consider the emergent cases).  Per Renal Navigator notes there are no clinics that will accept patient unless she will have HD in chair. Will continue to encourage use of chair for HD.    Tobie Poet, NP [facility] 12/12/2021,3:03 PM  LOS: 108 days

## 2021-12-12 NOTE — Progress Notes (Signed)
[facility]                                                                              [name], is a 56 y.o. female, DOB - 1965/05/28, PPI:[id number] Admit date - 08/26/2021    Outpatient Primary MD for the patient is [name], MD  LOS - 108  days  Chief Complaint  Patient presents with   Wound Infection       Brief summary   56 year old white female HTN HLD IDDM ESRD MWF Left BKA 2021, 01/12/2020 for chronic calcaneus osteomyelitis (prior toe amputation Chopart potation 2014 2015) status post IM nail 12/29/2019--underwent irrigation debridement left hip previously 01/19/2020 and was treated with ertapenem 2/2 Enterobacter/ Bacteroides Bipolar   Patient fell hit the trying to transfer from wheelchair to recliner 5/19 witnessed--found to have distal femoral fracture deemed nonoperative she was admitted 6/3-through 6/23 and underwent partial resection of femur for osteomyelitis of infected soft tissue and rearrangement of bony fragments 20 x 15 cm and was on antibiotics as guided by ID for cultures growing Staph aureus     Patient presented secondary to copious left hip bleeding and found to have evidence of subacute osteomyelitis and abscess of left femur. Orthopedic surgery/infectious disease consulted. Patient was started on empiric antibiotics and underwent multiple I&Ds by Dr. [name] on 0/63, had application of Kerecis micro powder 7/1 with local tissue rearrangement 40 x 10 cm   She was also seen by vascular surgery who did a fistula branch ligation   Patient completed a 6 week course of daptomycin 10/09/21   Discharge is pending ability for patient to tolerate HD in a chair which is complicated by patient's refusal/inability to sit in a chair secondary to pain. Patient tolerated HD in a chair on 9/4.     Awaiting ability to sit up to allow for her to go to skilled facility-    Assessment & Plan    ESRD on HD MWF/anasarca -Per nephrology.  Will reach  out to [facility] to see what are the latest updates -Lasix 80 daily-[for hyperkalemia]continuing calcium carbonate 200 every 2 as needed, Renvela 2400 3 times daily -Has to do HD in chair for outpatient HD -Cont to encourage fluid restriction   Subacute osteomyelitis of left femur with abscess/infected left hip hardware s/p removal on 6/10 prior to admission with I&D on 6/28 and 7/1 -Completed 6 weeks of appropriate antibiotics per ID recommendation.  -CT hip on 9/2 with fluid collection and soft tissue thickening.   -Ortho rec-outpatient f/up with Dr. [name] 1 wk after discharge and follow-up -- re-image L knee 10/2 shows healing fracture with callus -Low suspicion for persistent infection. -pain control remains an issue therefore she has been changed to fentanyl patch, + Oxy IR 15 every 3 as needed -Therapy has updated goal for next 2 weeks to work on improving sitting tolerance with sliding board transfers on Tuesdays and Thursdays   History of left intertrochanteric fracture: previously managed with IM nail placement in 2021. Hardware removed secondary to above.   Hyponatremia/Hypokalemia/Hyperkalemia/hypomagnesemia -Management with hemodialysis-continues to have mild hyperKL and hyponatremia-management with dialysis [facility]  ABLA superimposed on anemia of chronic disease: Stable. -Monitor intermittently   Controlled IDDM-2 with hyperglycemia and ESRD: A1c 5.9%. -Continue SSI-sensitive -Continue NovoLog 4 units 3 times daily with meals -Stable on Semglee 7 units at night -Glucose trends stable in 158-226 overall astable    Primary hypertension -Continue amlodipine 10 mg, hydralazine 25 x 3 times daily and Coreg 6.25 twice daily   History of CVA/HLD -Continue Lipitor   Right leg discomfort -Complaining of this since 10/11 when she rolled onto her right leg-she has a hard area of induration on the lateral aspect of the thigh-I do not know if this is calciphylaxis-if this persists  we will image   GERD -Continue Pepcid and Protonix   Tobacco use -Tobacco cessation discussed during this admission.   Anxiety and depression: -Continue amitriptyline 25 Cymbalta DR 60 -Seems stable   Code Status: Full code DVT Prophylaxis:  Place TED hose Start: 09/06/21 1507 SCDs Start: 08/31/21 1058   Level of Care: Level of care: Med-Surg Family Communication: Updated patient  Disposition Plan:      Remains inpatient appropriate: Difficult placement due to inability to sit in the chair for HD   Procedures:  Hemodialysis  Consultants:   Nephrology Orthopedics Discussed surgery  Antimicrobials:   Anti-infectives (From admission, onward)    Start     Dose/Rate Route Frequency Ordered Stop   10/02/21 2000  DAPTOmycin (CUBICIN) 900 mg in sodium chloride 0.9 % IVPB  Status:  Discontinued        10 mg/kg  92.4 kg 136 mL/hr over 30 Minutes Intravenous Once per day on Mon Wed Fri 10/01/21 1350 10/12/21 1146   09/30/21 2000  DAPTOmycin (CUBICIN) 900 mg in sodium chloride 0.9 % IVPB  Status:  Discontinued        10 mg/kg  92.4 kg 136 mL/hr over 30 Minutes Intravenous Daily 09/28/21 0919 10/01/21 1350   09/28/21 2000  DAPTOmycin (CUBICIN) 900 mg in sodium chloride 0.9 % IVPB        900 mg 136 mL/hr over 30 Minutes Intravenous  Once 09/28/21 0919 09/28/21 2146   09/02/21 2000  DAPTOmycin (CUBICIN) 900 mg in sodium chloride 0.9 % IVPB  Status:  Discontinued        10 mg/kg  92.4 kg 136 mL/hr over 30 Minutes Intravenous Once per day on Mon Wed Fri 09/02/21 0836 09/28/21 0919   08/31/21 0745  ceFAZolin (ANCEF) IVPB 2g/100 mL premix        2 g 200 mL/hr over 30 Minutes Intravenous To Short Stay 08/31/21 0730 08/31/21 0918   08/30/21 0600  ceFAZolin (ANCEF) IVPB 2g/100 mL premix  Status:  Discontinued        2 g 200 mL/hr over 30 Minutes Intravenous To Short Stay 08/30/21 0123 08/31/21 0600   08/29/21 2000  DAPTOmycin (CUBICIN) 900 mg in sodium chloride 0.9 % IVPB  Status:   Discontinued        10 mg/kg  92.4 kg 136 mL/hr over 30 Minutes Intravenous Every 48 hours 08/29/21 1627 09/02/21 0836   08/28/21 1800  ceFAZolin (ANCEF) IVPB 2g/100 mL premix  Status:  Discontinued        2 g 200 mL/hr over 30 Minutes Intravenous Every M-W-F (1800) 08/27/21 0846 08/29/21 1620   08/28/21 1200  ceFAZolin (ANCEF) IVPB 1 g/50 mL premix  Status:  Discontinued        1 g 100 mL/hr over 30 Minutes Intravenous Every M-W-F (Hemodialysis) 08/26/21 1954 08/27/21 8938  08/28/21 0600  ceFAZolin (ANCEF) IVPB 2g/100 mL premix        2 g 200 mL/hr over 30 Minutes Intravenous On call to O.R. 08/27/21 1150 08/28/21 0909   08/27/21 1800  ceFAZolin (ANCEF) IVPB 1 g/50 mL premix        1 g 100 mL/hr over 30 Minutes Intravenous  Once 08/26/21 1954 08/27/21 2048   08/26/21 1500  ceFEPIme (MAXIPIME) 1 g in sodium chloride 0.9 % 100 mL IVPB  Status:  Discontinued        1 g 200 mL/hr over 30 Minutes Intravenous Every 24 hours 08/26/21 1452 08/26/21 1714          Medications  (feeding supplement) PROSource Plus  30 mL Oral BID BM   alosetron  1 mg Oral BID   amitriptyline  25 mg Oral QHS   amLODipine  10 mg Oral Q24H   atorvastatin  40 mg Oral q1800   carvedilol  6.25 mg Oral BID   Chlorhexidine Gluconate Cloth  6 each Topical Q0600   cyclobenzaprine  10 mg Oral TID   docusate sodium  200 mg Oral BID   DULoxetine  60 mg Oral Daily   famotidine  20 mg Oral Daily   fentaNYL  1 patch Transdermal Q72H   furosemide  80 mg Oral Daily   hydrALAZINE  25 mg Oral TID   insulin aspart  0-9 Units Subcutaneous TID WC   insulin aspart  4 Units Subcutaneous TID WC   insulin glargine-yfgn  7 Units Subcutaneous QHS   multivitamin  1 tablet Oral QHS   pantoprazole  80 mg Oral Daily   polyethylene glycol  17 g Oral Daily   sevelamer carbonate  2,400 mg Oral TID WC   sodium zirconium cyclosilicate  10 g Oral Once per day on Sun Tue Thu Sat      Subjective:   Coherent awake no  distress Did not work much with therapy but they have updated the goals She states she has right leg pain as per above  Objective:   Vitals:   12/11/21 2137 12/12/21 0541 12/12/21 0856 12/12/21 1428  BP: (!) 106/47 (!) 125/51 (!) 118/54 (!) 113/59  Pulse: 62 64 66 62  Resp: 19 15 16 14   Temp: 98.9 F (37.2 C) 98.4 F (36.9 C)    TempSrc: Oral Oral    SpO2: 97% 99% 100% 97%  Weight:  88 kg    Height:        Intake/Output Summary (Last 24 hours) at 12/12/2021 1511 Last data filed at 12/12/2021 1300 Gross per 24 hour  Intake 1335 ml  Output --  Net 1335 ml      Wt Readings from Last 3 Encounters:  12/12/21 88 kg  08/23/21 96.5 kg  08/07/21 101.1 kg    Physical Exam  Coherent alert no distress EOMI NCAT no focal deficit CTA B no added sound rales rhonchi or wheeze S1-S2 no murmur Abdomen soft no rebound Left lower extremity reviewed but wound on left side not assessed-right-sided area of the lateral thigh seems antalgic tender and indurated although not warm    Data Reviewed:  I have personally reviewed following labs    CBC Lab Results  Component Value Date   WBC 7.1 12/11/2021   RBC 3.88 12/11/2021   HGB 10.8 (L) 12/11/2021   HCT 31.7 (L) 12/11/2021   MCV 81.7 12/11/2021   MCH 27.8 12/11/2021   PLT 212 12/11/2021   MCHC 34.1 12/11/2021  RDW 15.4 12/11/2021   LYMPHSABS 2.1 12/06/2021   MONOABS 1.0 12/06/2021   EOSABS 0.2 12/06/2021   BASOSABS 0.1 73/22/5672     Last metabolic panel Lab Results  Component Value Date   NA 126 (L) 12/11/2021   K 4.2 12/11/2021   CL 87 (L) 12/11/2021   CO2 25 12/11/2021   BUN 76 (H) 12/11/2021   CREATININE 5.32 (H) 12/11/2021   GLUCOSE 133 (H) 12/11/2021   GFRNONAA 9 (L) 12/11/2021   GFRAA 13 (L) 12/17/2018   CALCIUM 9.8 12/11/2021   PHOS 5.9 (H) 12/11/2021   PROT 7.6 12/02/2021   ALBUMIN 3.3 (L) 12/09/2021   LABGLOB 3.1 10/29/2018   AGRATIO 1.4 10/29/2018   BILITOT 0.6 12/02/2021   ALKPHOS 105  12/02/2021   AST 16 12/02/2021   ALT 15 12/02/2021   ANIONGAP 14 12/11/2021    CBG (last 3)  Recent Labs    12/11/21 2133 12/12/21 0726 12/12/21 1107  GLUCAP 165* 158* 226*       Coagulation Profile: No results for input(s): "INR", "PROTIME" in the last 168 hours.   Radiology Studies: I have personally reviewed the imaging studies  No results found.     Nita Sells M.D. [facility] 12/12/2021, 3:11 PM  Available via Epic secure chat 7am-7pm After 7 pm, please refer to night coverage provider listed on amion.

## 2021-12-12 NOTE — Progress Notes (Signed)
Physical Therapy Treatment Patient Details Name: [name] MRN: [id number] DOB: 12/09/1965 Today's Date: 12/12/2021   History of Present Illness 56 y.o. female presented to ED 6/26 from dialysis with increased bloody drainage from L hip wound. 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 s/p hardware removal of left hip with partial resection of tuft tissue and femure that were nonviable on 08/10/21 Discharged home 6/23. underwent L hip debridement and wound vac placement 6/28; +Left intertrochanteric non-union,  Fracture of left distal femur  PMH: hypertension, hyperlipidemia, ESRD on HD MWF, history of left BKA in 2021, depression/anxiety, stroke, tobacco use, T2DM,  insomnia, chronic pain syndrome,    PT Comments    Patient agreeable to LLE ROM, measurements and then OOB to chair via sliding board transfer. [name] able to complete transfer with min assist. Her goal is to sit up x 1 hour (afraid she will soon have to have a bowel movement) and mobility team is lined up to help her transfer back to bed at that time.     Recommendations for follow up therapy are one component of a multi-disciplinary discharge planning process, led by the attending physician.  Recommendations may be updated based on patient status, additional functional criteria and insurance authorization.  Follow Up Recommendations  Skilled nursing-short term rehab (<3 hours/day) (pt currently refusing due to inability to tolerate sitting in chair for 6+ hours as transported to/from dialysis) Can patient physically be transported by private vehicle: No   Assistance Recommended at Discharge Intermittent Supervision/Assistance  Patient can return home with the following A lot of help with bathing/dressing/bathroom;Assistance with cooking/housework;Assist for transportation;Two people to help with walking and/or transfers;Help with stairs or ramp for entrance   Equipment Recommendations  None  recommended by PT    Recommendations for Other Services       Precautions / Restrictions Precautions Precautions: Fall;Other (comment) Precaution Comments: L BKA (baseline) Restrictions Weight Bearing Restrictions: Yes LLE Weight Bearing: Weight bearing as tolerated Other Position/Activity Restrictions: L distal femur fx; per Dr [name] 8/10 pt can full WB     Mobility  Bed Mobility Overal bed mobility: Needs Assistance Bed Mobility: Supine to Sit, Rolling Rolling: Modified independent (Device/Increase time) (to left for left hip internal rotation stretch (held x 30 seconds); rt and lrft for placing pad prior to sliding board transfer)   Supine to sit: Modified independent (Device/Increase time)     General bed mobility comments: HOB flat, +rail    Transfers Overall transfer level: Needs assistance   Transfers: Bed to chair/wheelchair/BSC            Lateral/Scoot Transfers: Min assist, With slide board General transfer comment: pt requires assist to place board under Rt hip and to anchor board as she first starts scooting across board; pt scoots/moves her body without assist    Ambulation/Gait                   Stairs             Wheelchair Mobility    Modified Rankin (Stroke Patients Only)       Balance Overall balance assessment: Needs assistance Sitting-balance support: Feet supported Sitting balance-Leahy Scale: Good Sitting balance - Comments: no assistance with sitting balance                                    Cognition Arousal/Alertness: Awake/alert Behavior During  Therapy: WFL for tasks assessed/performed Overall Cognitive Status: Impaired/Different from baseline Area of Impairment: Awareness                         Safety/Judgement: Decreased awareness of deficits, Decreased awareness of safety Awareness: Emergent            Exercises Other Exercises Other Exercises: left knee contract-relax  stretching into extension. Continues to lack 38 degrees extension. (measured in supine) Other Exercises: left hip contract-relax into internal rotation with pt lacking 28 degrees from neutral. (measured in sitting) Other Exercises: educated pt again on placement of bolster/roll under left lateral thigh when lying in bed for prolonged left hip IR stretch. She had bolster in place, but too far under her leg where she was promotoing ER at hip    General Comments        Pertinent Vitals/Pain Pain Assessment Pain Assessment: 0-10 Pain Score: 7  Pain Location: left hip primarily Pain Descriptors / Indicators: Discomfort, Grimacing, Guarding Pain Intervention(s): Limited activity within patient's tolerance, Premedicated before session, Repositioned    Home Living                          Prior Function            PT Goals (current goals can now be found in the care plan section) Acute Rehab PT Goals Patient Stated Goal: less pain with movement Time For Goal Achievement: 12/24/21 Potential to Achieve Goals: Fair Progress towards PT goals: Progressing toward goals    Frequency    Min 2X/week      PT Plan Current plan remains appropriate    Co-evaluation              AM-PAC PT "6 Clicks" Mobility   Outcome Measure  Help needed turning from your back to your side while in a flat bed without using bedrails?: None Help needed moving from lying on your back to sitting on the side of a flat bed without using bedrails?: None Help needed moving to and from a bed to a chair (including a wheelchair)?: A Little Help needed standing up from a chair using your arms (e.g., wheelchair or bedside chair)?: Total Help needed to walk in hospital room?: Total Help needed climbing 3-5 steps with a railing? : Total 6 Click Score: 14    End of Session   Activity Tolerance: Patient limited by pain Patient left: with call bell/phone within reach;in chair Nurse Communication:  Mobility status;Other (comment) (MT plans to put pt back to bed at 11:45) PT Visit Diagnosis: Other abnormalities of gait and mobility (R26.89);Muscle weakness (generalized) (M62.81);History of falling (Z91.81);Pain Pain - Right/Left: Left Pain - part of body: Leg     Time: 1019-1050 PT Time Calculation (min) (ACUTE ONLY): 31 min  Charges:  $Therapeutic Exercise: 8-22 mins $Therapeutic Activity: 8-22 mins                      Arby Barrette, PT Acute Rehabilitation Services  Office [phone]    [name] 12/12/2021, 11:02 AM

## 2021-12-12 NOTE — TOC Progression Note (Signed)
Transition of Care [facility]) - Initial/Assessment Note    Patient Details  Name: [name] MRN: [id number] Date of Birth: June 25, 1965  Transition of Care [facility]) CM/SW Contact:    Milinda Antis, [place] Phone Number: 12/12/2021, 10:46 AM  Clinical Narrative:                 Patient continues to endorse being unable to sit for dialysis.  Patient cannot discharge until this has been accomplished.   TOC will continue to follow.   Expected Discharge Plan: Skilled Nursing Facility Barriers to Discharge: Continued Medical Work up   Patient Goals and CMS Choice Patient states their goals for this hospitalization and ongoing recovery are:: To return home CMS Medicare.gov Compare Post Acute Care list provided to:: Patient Choice offered to / list presented to : NA  Expected Discharge Plan and Services Expected Discharge Plan: [place]   Discharge Planning Services: CM Consult   Living arrangements for the past 2 months: Single Family Home                                      Prior Living Arrangements/Services Living arrangements for the past 2 months: Single Family Home Lives with:: Adult Children Patient language and need for interpreter reviewed:: Yes Do you feel safe going back to the place where you live?: Yes      Need for Family Participation in Patient Care: Yes (Comment) Care giver support system in place?: Yes (comment) Current home services: DME ([employer], w/c) Criminal Activity/Legal Involvement Pertinent to Current Situation/Hospitalization: No - Comment as needed  Activities of Daily Living Home Assistive Devices/Equipment: Eyeglasses, Wheelchair ADL Screening (condition at time of admission) Patient's cognitive ability adequate to safely complete daily activities?: Yes Is the patient deaf or have difficulty hearing?: No Does the patient have difficulty seeing, even when wearing glasses/contacts?: No Does the patient have difficulty  concentrating, remembering, or making decisions?: No Patient able to express need for assistance with ADLs?: Yes Does the patient have difficulty dressing or bathing?: Yes Independently performs ADLs?: No Communication: Independent Dressing (OT): Independent Is this a change from baseline?: Pre-admission baseline Grooming: Independent Feeding: Independent Bathing: Needs assistance Is this a change from baseline?: Pre-admission baseline Toileting: Needs assistance Is this a change from baseline?: Pre-admission baseline In/Out Bed: Needs assistance, Dependent Is this a change from baseline?: Pre-admission baseline Walks in Home: Dependent Is this a change from baseline?: Pre-admission baseline Does the patient have difficulty walking or climbing stairs?: Yes Weakness of Legs: Both Weakness of Arms/Hands: Both  Permission Sought/Granted Permission sought to share information with : Case Manager, Family Supports Permission granted to share information with : Yes, Verbal Permission Granted              Emotional Assessment Appearance:: Appears stated age Attitude/Demeanor/Rapport: Engaged, Gracious Affect (typically observed): Accepting, Appropriate, Calm, Hopeful Orientation: : Oriented to Self, Oriented to Place, Oriented to  Time, Oriented to Situation Alcohol / Substance Use: Not Applicable Psych Involvement: No (comment)  Admission diagnosis:  Wound infection [T14.8XXA, L08.9] Abscess of left hip [L02.416] Patient Active Problem List   Diagnosis Date Noted   Wound dehiscence, surgical, sequela    Abscess of left hip 08/26/2021   History of CVA (cerebrovascular accident) 33/00/7622   Hardware complicating wound infection ([place])    Subacute osteomyelitis of left femur with abscess     Septic arthritis of hip (  [place]) 07/30/2021   Skin abscess L hip 07/28/2021   HCAP (healthcare-associated pneumonia) 07/27/2021   Hyperkalemia 62/22/9798   Metabolic acidemia 92/01/9416    Closed fracture of left distal femur ([place]) 07/22/2021   Type 2 diabetes mellitus with hyperlipidemia ([place]) 07/22/2021   Infection of superficial incisional surgical site after procedure 03/09/2020   Abscess    Hypoglycemia    Essential hypertension    Sleep disturbance    ESRD (end stage renal disease) on dialysis ([place]) 01/24/2020   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[place]) 01/23/2020   Hyponatremia    Constipation    Chronic osteomyelitis involving left ankle and foot ([place])    Ulcer of left foot with necrosis of bone ([place])    Wound infection 12/28/2019   History of Chopart amputation of left foot ([place]) 12/25/2017   History of CVA (cerebrovascular accident) without residual deficits 07/04/2017   Cerebral thrombosis with cerebral infarction 04/08/2017   Right sided weakness 04/07/2017   Hyperhidrosis 09/01/2016   Migraine with aura and without status migrainosus, not intractable 07/28/2016   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[place]) 08/04/2014   CKD stage 3 due to type 2 diabetes mellitus ([place]) 06/27/2014   Vitamin D insufficiency 05/08/2014   Obesity (BMI 30.0-34.9) 05/08/2014   Unilateral amputation of left foot ([place]) 05/08/2014   Bursitis of left shoulder 02/14/2014   Neck pain 01/03/2014   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.23) 01/14/2013   Insomnia 08/04/2012   Diabetic neuropathy, painful ([place]) 08/04/2011   Anxiety and depression 05/16/2010   Female stress incontinence 11/01/2007   TOBACCO DEPENDENCE 04/30/2006   GASTROESOPHAGEAL REFLUX, NO ESOPHAGITIS 04/30/2006   Irritable bowel syndrome 04/30/2006   PCP:  [name], MD Pharmacy:   [facility], [place] [address] [place] [address] Phone: [phone] Fax: [phone]  [id number] - [place], [place] [address] [place]  [address] Phone: [phone] Fax: [phone]     Social Determinants of Health (SDOH) Interventions    Readmission Risk Interventions    07/26/2021    3:15 PM  Readmission Risk Prevention Plan  Transportation Screening Complete  PCP or Specialist Appt within 3-5 Days Complete  HRI or [place] Complete  Palliative Care Screening Not Applicable  Medication Review (RN Care Manager) Referral to Pharmacy

## 2021-12-13 LAB — GLUCOSE, CAPILLARY
Glucose-Capillary: 162 mg/dL — ABNORMAL HIGH (ref 70–99)
Glucose-Capillary: 186 mg/dL — ABNORMAL HIGH (ref 70–99)
Glucose-Capillary: 191 mg/dL — ABNORMAL HIGH (ref 70–99)
Glucose-Capillary: 218 mg/dL — ABNORMAL HIGH (ref 70–99)
Glucose-Capillary: 190 mg/dL — ABNORMAL HIGH (ref 70–99)

## 2021-12-13 LAB — CBC
HCT: 29.5 % — ABNORMAL LOW (ref 36.0–46.0)
Hemoglobin: 9.8 g/dL — ABNORMAL LOW (ref 12.0–15.0)
MCH: 27.5 pg (ref 26.0–34.0)
MCHC: 33.2 g/dL (ref 30.0–36.0)
MCV: 82.9 fL (ref 80.0–100.0)
Platelets: 217 K/uL (ref 150–400)
RBC: 3.56 MIL/uL — ABNORMAL LOW (ref 3.87–5.11)
RDW: 15.5 % (ref 11.5–15.5)
WBC: 6.7 K/uL (ref 4.0–10.5)
nRBC: 0 % (ref 0.0–0.2)

## 2021-12-13 LAB — RENAL FUNCTION PANEL
Albumin: 3.4 g/dL — ABNORMAL LOW (ref 3.5–5.0)
Anion gap: 15 (ref 5–15)
BUN: 83 mg/dL — ABNORMAL HIGH (ref 6–20)
CO2: 26 mmol/L (ref 22–32)
Calcium: 9 mg/dL (ref 8.9–10.3)
Chloride: 87 mmol/L — ABNORMAL LOW (ref 98–111)
Creatinine, Ser: 6.2 mg/dL — ABNORMAL HIGH (ref 0.44–1.00)
GFR, Estimated: 7 mL/min — ABNORMAL LOW (ref >60)
Glucose, Bld: 170 mg/dL — ABNORMAL HIGH (ref 70–99)
Phosphorus: 5.6 mg/dL — ABNORMAL HIGH (ref 2.5–4.6)
Potassium: 4.6 mmol/L (ref 3.5–5.1)
Sodium: 128 mmol/L — ABNORMAL LOW (ref 135–145)

## 2021-12-13 MED ORDER — HEPARIN SODIUM (PORCINE) 1000 UNIT/ML IJ SOLN
4000.0000 [IU] | Freq: Once | INTRAMUSCULAR | Status: AC
  Administered 2021-12-16: 4000 [IU] via INTRAVENOUS
  Filled 2021-12-13: qty 4

## 2021-12-13 MED ORDER — HEPARIN SODIUM (PORCINE) 1000 UNIT/ML IJ SOLN
2000.0000 [IU] | Freq: Once | INTRAMUSCULAR | Status: AC
  Administered 2021-12-13: 2000 [IU] via INTRAVENOUS
  Filled 2021-12-13: qty 2

## 2021-12-13 MED ORDER — DARBEPOETIN ALFA 25 MCG/0.42ML IJ SOSY
25.0000 ug | PREFILLED_SYRINGE | INTRAMUSCULAR | Status: DC
  Administered 2021-12-13: 25 ug via INTRAVENOUS
  Filled 2021-12-13: qty 0.42

## 2021-12-13 MED ORDER — PENTAFLUOROPROP-TETRAFLUOROETH EX AERO
INHALATION_SPRAY | CUTANEOUS | Status: AC
  Filled 2021-12-13: qty 30

## 2021-12-13 NOTE — Progress Notes (Addendum)
Seen on Hd Had a bad day didnt sit up today felt nauseous  She is having pain on R outer thigh still but it is less swollen and less warm---I'm not sure if this is calciphylaxis, but it doesn't usually get better this quick--could be a  mild cellulitis,   as no WBC nor redness and improving would reassess in am  Plan remains overall unchanged--awaiting placement  No charge  Verneita Griffes, MD [facility] 1:51 PM

## 2021-12-13 NOTE — Progress Notes (Signed)
Subjective:  On hd in bed, tolerating uf at  start so far 4l, goal  stressed diet/ vol compliance  in [place]. Some neuropathy type discomfort R leg, reports Side effects with Ly rica, Neurontin . On prn pain  med's for now,  Stressed  participation with PT  Objective Vital signs in last 24 hours: Vitals:   12/12/21 1428 12/13/21 0621 12/13/21 0739 12/13/21 0748  BP: (!) 113/59 (!) 146/55 (!) 124/51 116/79  Pulse: 62 61 63 63  Resp: 14 15 16 11   Temp:  97.8 F (36.6 C) 97.6 F (36.4 C)   TempSrc:  Oral Oral   SpO2: 97% 100% 98% 99%  Weight:   88 kg   Height:       Weight change:   Physical Exam: General: alert Pleasant  Adult female  NAD Heart: RRR, no MRG Lungs: CTA non labored  breathing  Abdomen: NABS, soft, NT, ND Extremities:  Trace pedal edema Right lower extrem and L BKA  stump  Dialysis Access: Patent on HD LUA AVF   Dialysis Orders: MWF at [facility], 400/500, EDW 90kg, 2K/2Ca, LUE AVF - heparin 4000 unit IV bolus - No ESA  Problem/Plan:  Recurrent L hip osteomyelitis: S/p 08/10/21 surgery (removal hip IMN which was placed  12/2019) with persistent VRE infection. Back to OR 08/28/21, 08/31/21 for L hip debridement /wound vac  (now removed).  Completed 6 wk course of IV Daptomycin on 10/12/21. Management per primary team.  Chronic L hip/stump pain: She does have fractures of L femoral neck (where hardware was removed) as well as distal femur Fx. Pain control per primary.  ESRD: MWF HD schedule ,. Hyperkalemia: Recurrent issues, despite being in the hospital, 2K bath + furosemide. Lokelma 10g added on non-HD days -> now prn. 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 Virl Cagey. AVF working well now. HTN/volume: Minimal edema this a.m., still noncompliant with volume issues in hospital . UF 5L 10/9 and 3.9L 10/11.  Today UFG 4-5L as tolerated BP stable on amlodipine/hydralazine/Coreg.  Him to taper off meds with UF on HD to prevent cramping /stressed fluid  and sodium restrictions.  New EDW since admitted in the works. 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  A.m. Hg 9.8 start  ESA   Secondary HPTH: CorrCa slightly high, not on VDRA. Phos variable. Continue Renvela as binder. Nutrition: Alb low, continue protein supplements. DM2: Insulin per primary Dispo: SNF previously recommended, now she would like to go home on discharge even though HD unit has been clear that they are prohibited by company policy to assist patient out of her car and into the facility. Patient is aware of this.  Not home HD candidate currently/ dialyzed in recliner on 9/4 but signed off early due to pain, has declined to come in a recliner since then.  We were informed by PT on 10/10 of patient being close to be discharged from PT service for lack of progress. PT and myself had a discussion with patient on 10/10 at bedside about the situation. We again strongly discussed on the importance of increasing her mobility as much as possible while she is in her room in addition to following her ongoing fluid restriction. Also reinforced the importance of being in the chair for HD treatments to improve strength and mobility. Patient agrees for the next 2-weeks to work extensively with PT in her room by increasing her time in the chair with each PT session. Our goal is  to dialyze her in the mornings so the patient has more time to work with PT in the afternoons (we will do our best to accommodate this as we still need to always consider the emergent cases).  Per Renal Navigator notes there are no clinics that will accept patient unless she will have HD in chair. Will continue to encourage use of chair for HD.   Ernest Haber, PA-C [facility] Beeper [phone] 12/13/2021,8:05 AM  LOS: 109 days   Labs: Basic Metabolic Panel: Recent Labs  Lab 12/07/21 0703 12/09/21 1214 12/11/21 0138  NA 130* 127* 126*  K 4.5 5.2* 4.2  CL 90* 88* 87*  CO2 26 26 25   GLUCOSE 125* 195* 133*   BUN 79* 81* 76*  CREATININE 5.58* 5.62* 5.32*  CALCIUM 9.2 9.3 9.8  PHOS 5.7* 5.4* 5.9*   Liver Function Tests: Recent Labs  Lab 12/07/21 0703 12/09/21 1214  ALBUMIN 3.1* 3.3*   No results for input(s): "LIPASE", "AMYLASE" in the last 168 hours. No results for input(s): "AMMONIA" in the last 168 hours. CBC: Recent Labs  Lab 12/07/21 0703 12/09/21 1214 12/11/21 0138  WBC 6.9 6.7 7.1  HGB 9.6* 10.1* 10.8*  HCT 30.2* 30.2* 31.7*  MCV 85.8 82.3 81.7  PLT 189 180 212   Cardiac Enzymes: No results for input(s): "CKTOTAL", "CKMB", "CKMBINDEX", "TROPONINI" in the last 168 hours. CBG: Recent Labs  Lab 12/11/21 2133 12/12/21 0726 12/12/21 1107 12/12/21 1704 12/13/21 0721  GLUCAP 165* 158* 226* 170* 162*    Studies/Results: No results found. Medications:  methocarbamol (ROBAXIN) IV      (feeding supplement) PROSource Plus  30 mL Oral BID BM   alosetron  1 mg Oral BID   amitriptyline  25 mg Oral QHS   amLODipine  10 mg Oral Q24H   atorvastatin  40 mg Oral q1800   carvedilol  6.25 mg Oral BID   Chlorhexidine Gluconate Cloth  6 each Topical Q0600   cyclobenzaprine  10 mg Oral TID   docusate sodium  200 mg Oral BID   DULoxetine  60 mg Oral Daily   famotidine  20 mg Oral Daily   fentaNYL  1 patch Transdermal Q72H   furosemide  80 mg Oral Daily   heparin sodium (porcine)  2,000 Units Intravenous Once in dialysis   heparin sodium (porcine)  4,000 Units Intravenous Once in dialysis   hydrALAZINE  25 mg Oral TID   insulin aspart  0-9 Units Subcutaneous TID WC   insulin aspart  4 Units Subcutaneous TID WC   insulin glargine-yfgn  7 Units Subcutaneous QHS   multivitamin  1 tablet Oral QHS   pantoprazole  80 mg Oral Daily   pentafluoroprop-tetrafluoroeth       polyethylene glycol  17 g Oral Daily   sevelamer carbonate  2,400 mg Oral TID WC   sodium zirconium cyclosilicate  10 g Oral Once per day on Sun Tue Thu Sat

## 2021-12-13 NOTE — Plan of Care (Signed)
  Problem: Fluid Volume: Goal: Compliance with measures to maintain balanced fluid volume will improve Outcome: Progressing   Problem: Nutritional: Goal: Ability to make healthy dietary choices will improve Outcome: Progressing   Problem: Activity: Goal: Risk for activity intolerance will decrease Outcome: Not Progressing   Problem: Pain Managment: Goal: General experience of comfort will improve Outcome: Not Progressing

## 2021-12-13 NOTE — Progress Notes (Signed)
Received patient in bed to unit.  Alert and oriented.  Informed consent signed and in chart.   Treatment initiated: 6759 Treatment completed: 1145  Patient tolerated well.  Transported back to the room  Alert, without acute distress.  Hand-off given to patient's nurse.   Access used: AVF Access issues: NA  Total UF removed: 3200 ml Medication(s) given: heparin bolus 4000 units beginning of tx, heparin bolus 2000 units mid tx, aranesp 25 mcg Post HD VS:     97.9    104/52    65    12    98% RA Post HD weight: 84.2 kg   [facility]

## 2021-12-14 DIAGNOSIS — M86252 Subacute osteomyelitis, left femur: Secondary | ICD-10-CM | POA: Diagnosis not present

## 2021-12-14 LAB — GLUCOSE, CAPILLARY
Glucose-Capillary: 227 mg/dL — ABNORMAL HIGH (ref 70–99)
Glucose-Capillary: 191 mg/dL — ABNORMAL HIGH (ref 70–99)
Glucose-Capillary: 159 mg/dL — ABNORMAL HIGH (ref 70–99)
Glucose-Capillary: 204 mg/dL — ABNORMAL HIGH (ref 70–99)

## 2021-12-14 MED ORDER — DOXYCYCLINE HYCLATE 100 MG PO TABS
100.0000 mg | ORAL_TABLET | Freq: Two times a day (BID) | ORAL | Status: DC
  Administered 2021-12-14 – 2021-12-15 (×2): 100 mg via ORAL
  Filled 2021-12-14 (×2): qty 1

## 2021-12-14 MED ORDER — FENTANYL 12 MCG/HR TD PT72
1.0000 | MEDICATED_PATCH | TRANSDERMAL | Status: DC
  Administered 2021-12-14 – 2022-01-30 (×17): 1 via TRANSDERMAL
  Filled 2021-12-14 (×17): qty 1

## 2021-12-14 NOTE — Progress Notes (Signed)
Subjective: Seen in room.  Yesterday on schedule.  Today reports right outer thigh discomfort not similar to her neuropathy.  Pain fluctuates mostly when she coughs or pressure to the area.  Objective Vital signs in last 24 hours: Vitals:   12/13/21 2044 12/14/21 0400 12/14/21 0812 12/14/21 0855  BP: (!) 125/56  (!) 123/55 (!) 127/57  Pulse: 73  68 68  Resp:   20 17  Temp: 98.7 F (37.1 C)  98.5 F (36.9 C) 98.4 F (36.9 C)  TempSrc: Oral  Oral Oral  SpO2: 97%  99% 99%  Weight:      Height:  5\' 10"  (1.778 m)     Weight change:   Physical Exam: General: alert Pleasant  Adult female  NAD Heart: RRR, no MRG Lungs: CTA non labored  breathing  Abdomen: NABS, soft, NT, ND Extremities: Right lateral thigh minimal edema with no open ulcer or redness indurated on palpitation not exquisitely tender but tender.  Trace pedal edema Right lower extrem and L BKA  stump  Dialysis Access: LUA AVF + bruit   Dialysis Orders: MWF at [place], 400/500, EDW 90kg, 2K/2Ca, LUE AVF - heparin 4000 unit IV bolus - No ESA   Problem/Plan:  Recurrent L hip osteomyelitis: S/p 08/10/21 surgery (removal hip IMN which was placed  12/2019) with persistent VRE infection. Back to OR 08/28/21, 08/31/21 for L hip debridement /wound vac  (now removed).  Completed 6 wk course of IV Daptomycin on 10/12/21. Management per primary team.  Chronic L hip/stump pain: She does have fractures of L femoral neck (where hardware was removed) as well as distal femur Fx. Pain control per primary.  ESRD: MWF HD on schedule ,. Hyperkalemia: Recurrent issues, despite being in the hospital, 2K bath + furosemide. Lokelma 10g added on non-HD days -> now prn. 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 Virl Cagey. AVF working well now. HTN/volume: Minimal edema this a.m., still noncompliant with ho volume issues in hospital . UF 5L 10/9 and 3.9L 10/11.   3.23 L  10/13  BP stable on amlodipine 10 mg Q d (make  hs),hydralazine 25 mg tid (will decrease to bid) /Coreg.  6.25 mg twice daily(hold a.m. of HD) Attempt taper DOWN  BP meds with UF on HD to prevent cramping low bp on HD  /stressed fluid and sodium restrictions.  New EDW since admitted in the works. Right outer thigh discomfort= admit team work-up questionable cellulitis no antibiotic yet, Anemia of ESRD: Hgb 9-10, tsat 54%.  A.m. Hg 9.8 start  ESA 55mcg given 10/13 Secondary HPTH: CorrCa slightly high, not on VDRA. Phos 5 .6 variable. Continue Renvela as binder. Nutrition: Alb low, continue protein supplements. DM2: Insulin per primary Dispo: SNF previously recommended, now she would like to go home on discharge even though HD unit has been clear that they are prohibited by company policy to assist patient out of her car and into the facility. Patient is aware of this.  Not home HD candidate currently/ dialyzed in recliner on 9/4 but signed off early due to pain, has declined to come in a recliner since then.  We were informed by PT on 10/10 of patient being close to be discharged from PT service for lack of progress. PT and myself had a discussion with patient on 10/10 at bedside about the situation. We again strongly discussed on the importance of increasing her mobility as much as possible while she is in her room in addition  to following her ongoing fluid restriction. Also reinforced the importance of being in the chair for HD treatments to improve strength and mobility. Patient agrees for the next 2-weeks to work extensively with PT in her room by increasing her time in the chair with each PT session. Our goal is to dialyze her in the mornings so the patient has more time to work with PT in the afternoons (we will do our best to accommodate this as we still need to always consider the emergent cases).  Per Renal Navigator notes there are no clinics that will accept patient unless she will have HD in chair. Will continue to encourage use of chair for HD.    Ernest Haber, PA-C [facility] Beeper [phone] 12/14/2021,2:13 PM  LOS: 110 days   Labs: Basic Metabolic Panel: Recent Labs  Lab 12/09/21 1214 12/11/21 0138 12/13/21 0854  NA 127* 126* 128*  K 5.2* 4.2 4.6  CL 88* 87* 87*  CO2 26 25 26   GLUCOSE 195* 133* 170*  BUN 81* 76* 83*  CREATININE 5.62* 5.32* 6.20*  CALCIUM 9.3 9.8 9.0  PHOS 5.4* 5.9* 5.6*   Liver Function Tests: Recent Labs  Lab 12/09/21 1214 12/13/21 0854  ALBUMIN 3.3* 3.4*   No results for input(s): "LIPASE", "AMYLASE" in the last 168 hours. No results for input(s): "AMMONIA" in the last 168 hours. CBC: Recent Labs  Lab 12/09/21 1214 12/11/21 0138 12/13/21 0854  WBC 6.7 7.1 6.7  HGB 10.1* 10.8* 9.8*  HCT 30.2* 31.7* 29.5*  MCV 82.3 81.7 82.9  PLT 180 212 217   Cardiac Enzymes: No results for input(s): "CKTOTAL", "CKMB", "CKMBINDEX", "TROPONINI" in the last 168 hours. CBG: Recent Labs  Lab 12/13/21 1701 12/13/21 2049 12/13/21 2232 12/14/21 0718 12/14/21 1118  GLUCAP 191* 218* 190* 227* 191*    Studies/Results: No results found. Medications:  methocarbamol (ROBAXIN) IV      (feeding supplement) PROSource Plus  30 mL Oral BID BM   alosetron  1 mg Oral BID   amitriptyline  25 mg Oral QHS   amLODipine  10 mg Oral Q24H   atorvastatin  40 mg Oral q1800   carvedilol  6.25 mg Oral BID   Chlorhexidine Gluconate Cloth  6 each Topical Q0600   cyclobenzaprine  10 mg Oral TID   darbepoetin (ARANESP) injection - DIALYSIS  25 mcg Intravenous Q Fri-HD   docusate sodium  200 mg Oral BID   DULoxetine  60 mg Oral Daily   famotidine  20 mg Oral Daily   fentaNYL  1 patch Transdermal Q72H   furosemide  80 mg Oral Daily   heparin sodium (porcine)  4,000 Units Intravenous Once in dialysis   hydrALAZINE  25 mg Oral TID   insulin aspart  0-9 Units Subcutaneous TID WC   insulin aspart  4 Units Subcutaneous TID WC   insulin glargine-yfgn  7 Units Subcutaneous QHS   multivitamin  1  tablet Oral QHS   pantoprazole  80 mg Oral Daily   polyethylene glycol  17 g Oral Daily   sevelamer carbonate  2,400 mg Oral TID WC   sodium zirconium cyclosilicate  10 g Oral Once per day on Sun Tue Thu Sat

## 2021-12-14 NOTE — Progress Notes (Signed)
[facility]                                                                              [name], is a 56 y.o. 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 Orthopedic surgery/infectious disease consulted. Patient was started on empiric antibiotics and underwent multiple I&Ds by Dr. [name] on 9/38, had application of Kerecis micro powder 7/1 with local tissue rearrangement 40 x 10 cm   She was also seen by vascular surgery who did a fistula branch ligation   Patient completed a 6 week course of daptomycin 10/09/21   Discharge is pending ability for patient to tolerate HD in a chair which is complicated by patient's refusal/inability to sit in a chair secondary to pain. Patient tolerated HD in a chair on 9/4.     Awaiting ability to sit up to allow for her to go to skilled facility-    Assessment & Plan   Thigh discomfort on R outer thigh--likely mild  cellulitis from dependant position on that leg Started 10/10--No fluctuance-improved on its own some Give 3 days of doxycycline Monitor daily but should resolve--if doesn't needs imaging  ESRD on HD MWF/anasarca -Per nephrology.  Will reach out to [facility] to see what are the latest updates -Lasix 80 daily-[for hyperkalemia]continuing calcium carbonate 200 every 2 as needed, Renvela 2400 3 times daily -Has to do HD in chair for outpatient HD -Cont to encourage fluid restriction   Subacute osteomyelitis of left femur with abscess/infected left hip hardware s/p removal on 6/10 prior to admission with I&D on 6/28 and 7/1 -Completed 6 weeks of appropriate antibiotics per ID recommendation.  -CT hip on 9/2 with fluid collection and soft tissue thickening.   -Ortho rec-outpatient f/up with Dr. [name] 1 wk after discharge and follow-up -- re-image L knee 10/2 shows healing fracture with callus -pain control remains an issue therefore she has been changed to fentanyl patch, + Oxy IR 15 every 3 as needed -Therapy has updated goal for next 2 weeks to work on improving sitting tolerance with sliding board transfers on Tuesdays and Thursdays   History of left intertrochanteric fracture: previously  managed with IM nail placement in 2021. Hardware removed secondary to above.   Hyponatremia/Hypokalemia/Hyperkalemia/hypomagnesemia -Management with hemodialysis-continues to have mild hyperKL and hyponatremia-management with dialysis Lokelma TTSSu   ABLA superimposed on anemia of chronic disease: Stable. -Monitor intermittently   Controlled IDDM-2 with hyperglycemia and ESRD: A1c 5.9%. -Continue SSI-sensitive -Continue NovoLog 4 units 3 times daily with meals -Stable on Semglee 7 units at night -Glucose trends  191--227 overall astable    Primary hypertension -Continue amlodipine 10 mg, hydralazine 25 x 3 times daily and Coreg 6.25 twice daily   History of CVA/HLD -Continue Lipitor   GERD -Continue  Pepcid and Protonix   Tobacco use -Tobacco cessation discussed during this admission.   Anxiety and depression: -Continue amitriptyline 25 Cymbalta DR 60 -Seems stable   Code Status: Full code DVT Prophylaxis:  Place TED hose Start: 09/06/21 1507 SCDs Start: 08/31/21 1058   Level of Care: Level of care: Med-Surg Family Communication: Updated patient--daughter was at bedside  Disposition Plan:      Remains inpatient appropriate: Difficult placement due to inability to sit in the chair for HD   Procedures:  Hemodialysis  Consultants:   Nephrology Orthopedics Discussed surgery  Antimicrobials:   Anti-infectives (From admission, onward)    Start     Dose/Rate Route Frequency Ordered Stop   10/02/21 2000  DAPTOmycin (CUBICIN) 900 mg in sodium chloride 0.9 % IVPB  Status:  Discontinued        10 mg/kg  92.4 kg 136 mL/hr over 30 Minutes Intravenous Once per day on Mon Wed Fri 10/01/21 1350 10/12/21 1146   09/30/21 2000  DAPTOmycin (CUBICIN) 900 mg in sodium chloride 0.9 % IVPB  Status:  Discontinued        10 mg/kg  92.4 kg 136 mL/hr over 30 Minutes Intravenous Daily 09/28/21 0919 10/01/21 1350   09/28/21 2000  DAPTOmycin (CUBICIN) 900 mg in sodium chloride 0.9 % IVPB        900 mg 136 mL/hr over 30 Minutes Intravenous  Once 09/28/21 0919 09/28/21 2146   09/02/21 2000  DAPTOmycin (CUBICIN) 900 mg in sodium chloride 0.9 % IVPB  Status:  Discontinued        10 mg/kg  92.4 kg 136 mL/hr over 30 Minutes Intravenous Once per day on Mon Wed Fri 09/02/21 0836 09/28/21 0919   08/31/21 0745  ceFAZolin (ANCEF) IVPB 2g/100 mL premix        2 g 200 mL/hr over 30 Minutes Intravenous To Short Stay 08/31/21 0730 08/31/21 0918   08/30/21 0600  ceFAZolin (ANCEF) IVPB 2g/100 mL premix  Status:  Discontinued        2 g 200 mL/hr over 30 Minutes Intravenous To Short Stay 08/30/21 0123 08/31/21 0600   08/29/21 2000  DAPTOmycin (CUBICIN) 900 mg in sodium chloride 0.9 % IVPB  Status:   Discontinued        10 mg/kg  92.4 kg 136 mL/hr over 30 Minutes Intravenous Every 48 hours 08/29/21 1627 09/02/21 0836   08/28/21 1800  ceFAZolin (ANCEF) IVPB 2g/100 mL premix  Status:  Discontinued        2 g 200 mL/hr over 30 Minutes Intravenous Every M-W-F (1800) 08/27/21 0846 08/29/21 1620   08/28/21 1200  ceFAZolin (ANCEF) IVPB 1 g/50 mL premix  Status:  Discontinued        1 g 100 mL/hr over 30 Minutes Intravenous Every M-W-F (Hemodialysis) 08/26/21 1954 08/27/21 0846   08/28/21 0600  ceFAZolin (ANCEF) IVPB 2g/100 mL premix  2 g 200 mL/hr over 30 Minutes Intravenous On call to O.R. 08/27/21 1150 08/28/21 0909   08/27/21 1800  ceFAZolin (ANCEF) IVPB 1 g/50 mL premix        1 g 100 mL/hr over 30 Minutes Intravenous  Once 08/26/21 1954 08/27/21 2048   08/26/21 1500  ceFEPIme (MAXIPIME) 1 g in sodium chloride 0.9 % 100 mL IVPB  Status:  Discontinued        1 g 200 mL/hr over 30 Minutes Intravenous Every 24 hours 08/26/21 1452 08/26/21 1714          Medications  (feeding supplement) PROSource Plus  30 mL Oral BID BM   alosetron  1 mg Oral BID   amitriptyline  25 mg Oral QHS   amLODipine  10 mg Oral Q24H   atorvastatin  40 mg Oral q1800   carvedilol  6.25 mg Oral BID   Chlorhexidine Gluconate Cloth  6 each Topical Q0600   cyclobenzaprine  10 mg Oral TID   darbepoetin (ARANESP) injection - DIALYSIS  25 mcg Intravenous Q Fri-HD   docusate sodium  200 mg Oral BID   DULoxetine  60 mg Oral Daily   famotidine  20 mg Oral Daily   fentaNYL  1 patch Transdermal Q72H   furosemide  80 mg Oral Daily   heparin sodium (porcine)  4,000 Units Intravenous Once in dialysis   hydrALAZINE  25 mg Oral TID   insulin aspart  0-9 Units Subcutaneous TID WC   insulin aspart  4 Units Subcutaneous TID WC   insulin glargine-yfgn  7 Units Subcutaneous QHS   multivitamin  1 tablet Oral QHS   pantoprazole  80 mg Oral Daily   polyethylene glycol  17 g Oral Daily   sevelamer carbonate  2,400 mg  Oral TID WC   sodium zirconium cyclosilicate  10 g Oral Once per day on Sun Tue Thu Sat      Subjective:   R thi has discomfort in outer aspect but has improved No cp Pain patch came off  Objective:   Vitals:   12/13/21 2044 12/14/21 0400 12/14/21 0812 12/14/21 0855  BP: (!) 125/56  (!) 123/55 (!) 127/57  Pulse: 73  68 68  Resp:   20 17  Temp: 98.7 F (37.1 C)  98.5 F (36.9 C) 98.4 F (36.9 C)  TempSrc: Oral  Oral Oral  SpO2: 97%  99% 99%  Weight:      Height:  5\' 10"  (1.778 m)      Intake/Output Summary (Last 24 hours) at 12/14/2021 1603 Last data filed at 12/14/2021 1856 Gross per 24 hour  Intake 600 ml  Output 0 ml  Net 600 ml      Wt Readings from Last 3 Encounters:  12/13/21 84.2 kg  08/23/21 96.5 kg  08/07/21 101.1 kg    Physical Exam  Coherent alert no distress EOMI NCAT no focal deficit CTA B no added sound rales rhonchi or wheeze S1-S2 no murmur Abdomen soft no rebound Left lower extremity reviewed--wound is healing up well with expected post-op changes-not erythematous right-sided area of the lateral thigh less swollen and red today Neuro intact coherent    Data Reviewed:  I have personally reviewed following labs    CBC Lab Results  Component Value Date   WBC 6.7 12/13/2021   RBC 3.56 (L) 12/13/2021   HGB 9.8 (L) 12/13/2021   HCT 29.5 (L) 12/13/2021   MCV 82.9 12/13/2021   MCH 27.5 12/13/2021  PLT 217 12/13/2021   MCHC 33.2 12/13/2021   RDW 15.5 12/13/2021   LYMPHSABS 2.1 12/06/2021   MONOABS 1.0 12/06/2021   EOSABS 0.2 12/06/2021   BASOSABS 0.1 43/88/8757     Last metabolic panel Lab Results  Component Value Date   NA 128 (L) 12/13/2021   K 4.6 12/13/2021   CL 87 (L) 12/13/2021   CO2 26 12/13/2021   BUN 83 (H) 12/13/2021   CREATININE 6.20 (H) 12/13/2021   GLUCOSE 170 (H) 12/13/2021   GFRNONAA 7 (L) 12/13/2021   GFRAA 13 (L) 12/17/2018   CALCIUM 9.0 12/13/2021   PHOS 5.6 (H) 12/13/2021   PROT 7.6 12/02/2021    ALBUMIN 3.4 (L) 12/13/2021   LABGLOB 3.1 10/29/2018   AGRATIO 1.4 10/29/2018   BILITOT 0.6 12/02/2021   ALKPHOS 105 12/02/2021   AST 16 12/02/2021   ALT 15 12/02/2021   ANIONGAP 15 12/13/2021    CBG (last 3)  Recent Labs    12/13/21 2232 12/14/21 0718 12/14/21 1118  GLUCAP 190* 227* 191*       Coagulation Profile: No results for input(s): "INR", "PROTIME" in the last 168 hours.   Radiology Studies: I have personally reviewed the imaging studies  No results found.     Nita Sells M.D. [facility] 12/14/2021, 4:03 PM  Available via Epic secure chat 7am-7pm After 7 pm, please refer to night coverage provider listed on amion.

## 2021-12-15 DIAGNOSIS — M86252 Subacute osteomyelitis, left femur: Secondary | ICD-10-CM | POA: Diagnosis not present

## 2021-12-15 LAB — GLUCOSE, CAPILLARY
Glucose-Capillary: 136 mg/dL — ABNORMAL HIGH (ref 70–99)
Glucose-Capillary: 193 mg/dL — ABNORMAL HIGH (ref 70–99)
Glucose-Capillary: 239 mg/dL — ABNORMAL HIGH (ref 70–99)
Glucose-Capillary: 189 mg/dL — ABNORMAL HIGH (ref 70–99)

## 2021-12-15 MED ORDER — HYDRALAZINE HCL 25 MG PO TABS
25.0000 mg | ORAL_TABLET | Freq: Two times a day (BID) | ORAL | Status: DC
  Administered 2021-12-15 – 2021-12-23 (×13): 25 mg via ORAL
  Filled 2021-12-15 (×14): qty 1

## 2021-12-15 MED ORDER — CHLORHEXIDINE GLUCONATE CLOTH 2 % EX PADS: 6.0000 | MEDICATED_PAD | Freq: Every day | CUTANEOUS | Status: DC

## 2021-12-15 MED ORDER — SODIUM CHLORIDE 0.9 % IV SOLN
2.0000 g | INTRAVENOUS | Status: AC
  Administered 2021-12-15 – 2021-12-18 (×4): 2 g via INTRAVENOUS
  Filled 2021-12-15 (×4): qty 20

## 2021-12-15 NOTE — Progress Notes (Signed)
Subjective: Right lateral thigh comfort improving on doxycycline, HD tomorrow/ 10/16.  Refusing recliner   Objective Vital signs in last 24 hours: Vitals:   12/14/21 1642 12/14/21 2052 12/15/21 0543 12/15/21 0850  BP: (!) 120/59 (!) 137/57 (!) 140/60 122/62  Pulse: (!) 59 62 60 64  Resp: 18 18 17 17   Temp: (!) 97.5 F (36.4 C) 98 F (36.7 C) 97.6 F (36.4 C) 98 F (36.7 C)  TempSrc: Oral Oral Oral Oral  SpO2: 100% 99% 100% 100%  Weight:      Height:       Weight change:   Physical Exam: General: alert Pleasant  Adult female  NAD Heart: RRR, no MRG Lungs: CTA non labored  breathing  Abdomen: NABS, soft, NT, ND Extremities: Right lateral thigh minimal edema with no open ulcer or redness indurated  improving on palpitation not exquisitely tender   Trace pedal edema Right lower extrem and L BKA  stump  Dialysis Access: LUA AVF + bruit   Dialysis Orders: MWF at [facility], 400/500, EDW 90kg, 2K/2Ca, LUE AVF - heparin 4000 unit IV bolus - No ESA   Problem/Plan:  Recurrent L hip osteomyelitis: S/p 08/10/21 surgery (removal hip IMN which was placed  12/2019) with persistent VRE infection. Back to OR 08/28/21, 08/31/21 for L hip debridement /wound vac  (now removed).  Completed 6 wk course of IV Daptomycin on 10/12/21. Management per primary team.  Chronic L hip/stump pain: She does have fractures of L femoral neck (where hardware was removed) as well as distal femur Fx. Pain control per primary.  ESRD: MWF HD on schedule ,. Hyperkalemia: Recurrent issues, despite being in the hospital, 2K bath + furosemide. Lokelma 10g added on non-HD days -> now prn. 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 Virl Cagey. AVF working well now. HTN/volume: Minimal edema this a.m., still noncompliant with ho volume issues in hospital . UF 5L 10/9 and 3.9L 10/11.   3.23 L  10/13  BP stable on amlodipine 10 mg Q d (make hs),hydralazine 25 mg tid (will decrease to bid) /Coreg.  6.25 mg  twice daily(hold a.m. of HD) Attempt taper DOWN  BP meds with UF on HD to prevent cramping low bp on HD  /stressed fluid and sodium restrictions.  New EDW since admitted in the works. Right outer thigh discomfort= admit team work-up questionable cellulitis started on doxycycline with improvement Anemia of ESRD: Hgb 9-10, tsat 54%.  A.m. Hg 9.8 start  ESA 34mcg given 10/13 Secondary HPTH: CorrCa slightly high, not on VDRA. Phos 5 .6 variable. Continue Renvela as binder. Nutrition: Alb low, continue protein supplements. DM2: Insulin per primary Dispo: SNF previously recommended, now she would like to go home on discharge even though HD unit has been clear that they are prohibited by company policy to assist patient out of her car and into the facility. Patient is aware of this.  Not home HD candidate currently/ dialyzed in recliner on 9/4 but signed off early due to pain, has declined to come in a recliner since then.  We were informed by PT on 10/10 of patient being close to be discharged from PT service for lack of progress. PT and myself had a discussion with patient on 10/10 at bedside about the situation. We again strongly discussed on the importance of increasing her mobility as much as possible while she is in her room in addition to following her ongoing fluid restriction. Also reinforced the importance of being  in the chair for HD treatments to improve strength and mobility. Patient agrees for the next 2-weeks to work extensively with PT in her room by increasing her time in the chair with each PT session. Our goal is to dialyze her in the mornings so the patient has more time to work with PT in the afternoons (we will do our best to accommodate this as we still need to always consider the emergent cases).  Per [facility] notes there are no clinics that will accept patient unless she will have HD in chair. Will continue to encourage use of chair for HD.  Ernest Haber, PA-C [facility] [phone] 12/15/2021,1:16 PM  LOS: 111 days   Labs: Basic Metabolic Panel: Recent Labs  Lab 12/09/21 1214 12/11/21 0138 12/13/21 0854  NA 127* 126* 128*  K 5.2* 4.2 4.6  CL 88* 87* 87*  CO2 26 25 26   GLUCOSE 195* 133* 170*  BUN 81* 76* 83*  CREATININE 5.62* 5.32* 6.20*  CALCIUM 9.3 9.8 9.0  PHOS 5.4* 5.9* 5.6*   Liver Function Tests: Recent Labs  Lab 12/09/21 1214 12/13/21 0854  ALBUMIN 3.3* 3.4*   No results for input(s): "LIPASE", "AMYLASE" in the last 168 hours. No results for input(s): "AMMONIA" in the last 168 hours. CBC: Recent Labs  Lab 12/09/21 1214 12/11/21 0138 12/13/21 0854  WBC 6.7 7.1 6.7  HGB 10.1* 10.8* 9.8*  HCT 30.2* 31.7* 29.5*  MCV 82.3 81.7 82.9  PLT 180 212 217   Cardiac Enzymes: No results for input(s): "CKTOTAL", "CKMB", "CKMBINDEX", "TROPONINI" in the last 168 hours. CBG: Recent Labs  Lab 12/14/21 1118 12/14/21 1614 12/14/21 2133 12/15/21 0723 12/15/21 1113  GLUCAP 191* 159* 204* 136* 193*    Studies/Results: No results found. Medications:  methocarbamol (ROBAXIN) IV      (feeding supplement) PROSource Plus  30 mL Oral BID BM   alosetron  1 mg Oral BID   amitriptyline  25 mg Oral QHS   amLODipine  10 mg Oral Q24H   atorvastatin  40 mg Oral q1800   carvedilol  6.25 mg Oral BID   Chlorhexidine Gluconate Cloth  6 each Topical Q0600   cyclobenzaprine  10 mg Oral TID   darbepoetin (ARANESP) injection - DIALYSIS  25 mcg Intravenous Q Fri-HD   docusate sodium  200 mg Oral BID   doxycycline  100 mg Oral Q12H   DULoxetine  60 mg Oral Daily   famotidine  20 mg Oral Daily   fentaNYL  1 patch Transdermal Q72H   furosemide  80 mg Oral Daily   heparin sodium (porcine)  4,000 Units Intravenous Once in dialysis   hydrALAZINE  25 mg Oral TID   insulin aspart  0-9 Units Subcutaneous TID WC   insulin aspart  4 Units Subcutaneous TID WC   insulin glargine-yfgn  7 Units Subcutaneous QHS   multivitamin  1 tablet Oral  QHS   pantoprazole  80 mg Oral Daily   polyethylene glycol  17 g Oral Daily   sevelamer carbonate  2,400 mg Oral TID WC   sodium zirconium cyclosilicate  10 g Oral Once per day on Sun Tue Thu Sat

## 2021-12-15 NOTE — Progress Notes (Signed)
[facility]                                                                            [name], is a 56 y.o. 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 Orthopedic surgery/infectious disease consulted. Patient was started on empiric antibiotics and underwent multiple I&Ds by Dr. [name] on 0/63, had application of Kerecis micro powder 7/1 with local tissue rearrangement 40 x 10 cm   She was also seen by vascular surgery who did a fistula branch ligation   Patient completed a 6 week course of daptomycin 10/09/21   Discharge is pending ability for patient to tolerate HD in a chair which is complicated by patient's refusal/inability to sit in a chair secondary to pain. Patient tolerated HD in a chair on 9/4.     Awaiting ability to sit up to allow for her to go to skilled facility-    Assessment & Plan   Thigh discomfort on R outer thigh--likely mild cellulitis  from dependant position on that leg with tissue fluid Started 10/10--No fluctuance-improved on its own some--doubt infectious Cannot tol doxycycline--getting non contrast CT of Thigh to ensure no infection but unlikely  ESRD on HD MWF/anasarca -Per nephrology.  Will reach out to [facility] to see what are the latest updates -Lasix 80 daily-[for hyperkalemia] continuing calcium carbonate 200 every 2 as needed, Renvela 2400 3 times daily--PRN labs with HD -Has to do HD in chair for outpatient HD--continue to work on this with PT and transfers -Cont to encourage fluid restriction   Subacute osteomyelitis of left femur with abscess/infected left hip hardware s/p removal on 6/10 prior to admission with I&D on 6/28 and 7/1 -Completed 6 weeks of appropriate antibiotics per ID recommendation.  -CT hip on 9/2 with fluid collection and soft tissue thickening.   -Ortho rec-outpatient f/up with Dr. [name] 1 wk after discharge and follow-up -- re-image L knee 10/2 shows healing fracture with callus -pain control remains - changed to fentanyl patch, + Oxy IR 15 every 3 as needed -Therapy has updated goal for next 2 weeks to work on improving sitting tolerance with sliding board transfers on Tuesdays  and Thursdays--she has intermittent refusals of this   History of left intertrochanteric fracture: previously managed with IM nail placement in 2021. Hardware removed secondary to above.   Hyponatremia/Hypokalemia/Hyperkalemia/hypomagnesemia -Management with hemodialysis-continues to have mild hyperKL and hyponatremia-management with dialysis Lokelma TTSSu   ABLA superimposed on anemia of chronic disease: Stable. -Monitor intermittently   Controlled IDDM-2 with hyperglycemia and ESRD: A1c 5.9%. -Continue SSI-sensitive -Continue NovoLog 4 units 3 times daily with meals -Stable on Semglee 7 units at night -Glucose trends 190-210    Primary hypertension -d/c amlodipine 10 mg, cont hydralazine 25 x 3 times daily  and Coreg 6.25 twice daily   History of CVA/HLD -Continue Lipitor   GERD -Continue Pepcid and Protonix   Tobacco use -Tobacco cessation discussed during this admission.   Anxiety and depression: -Continue amitriptyline 25 Cymbalta DR 60 -Seems stable   Code Status: Full code DVT Prophylaxis:  Place TED hose Start: 09/06/21 1507 SCDs Start: 08/31/21 1058   Level of Care: Level of care: Med-Surg Family Communication: Updated patient- alone  Disposition Plan:      Remains inpatient appropriate: Difficult placement due to inability to sit in the chair for HD   Procedures:  Hemodialysis  Consultants:   Nephrology Orthopedics Discussed surgery  Antimicrobials:  ceftriaxone   Medications  (feeding supplement) PROSource Plus  30 mL Oral BID BM   alosetron  1 mg Oral BID   amitriptyline  25 mg Oral QHS   amLODipine  10 mg Oral Q24H   atorvastatin  40 mg Oral q1800   carvedilol  6.25 mg Oral BID   [START ON 12/16/2021] Chlorhexidine Gluconate Cloth  6 each Topical Q0600   cyclobenzaprine  10 mg Oral TID   darbepoetin (ARANESP) injection - DIALYSIS  25 mcg Intravenous Q Fri-HD   docusate sodium  200 mg Oral BID   doxycycline  100 mg Oral Q12H   DULoxetine  60 mg Oral Daily   famotidine  20 mg Oral Daily   fentaNYL  1 patch Transdermal Q72H   furosemide  80 mg Oral Daily   heparin sodium (porcine)  4,000 Units Intravenous Once in dialysis   hydrALAZINE  25 mg Oral BID   insulin aspart  0-9 Units Subcutaneous TID WC   insulin aspart  4 Units Subcutaneous TID WC   insulin glargine-yfgn  7 Units Subcutaneous QHS   multivitamin  1 tablet Oral QHS   pantoprazole  80 mg Oral Daily   polyethylene glycol  17 g Oral Daily   sevelamer carbonate  2,400 mg Oral TID WC   sodium zirconium cyclosilicate  10 g Oral Once per day on Sun Tue Thu Sat      Subjective:   R thigh aboput the same Seems like tissue fluid on exam-pain when pressed deeply Some upset stomach and  diarr --likely from doxycycline  Objective:   Vitals:   12/14/21 1642 12/14/21 2052 12/15/21 0543 12/15/21 0850  BP: (!) 120/59 (!) 137/57 (!) 140/60 122/62  Pulse: (!) 59 62 60 64  Resp: 18 18 17 17   Temp: (!) 97.5 F (36.4 C) 98 F (36.7 C) 97.6 F (36.4 C) 98 F (36.7 C)  TempSrc: Oral Oral Oral Oral  SpO2: 100% 99% 100% 100%  Weight:      Height:        Intake/Output Summary (Last 24 hours) at 12/15/2021 1626 Last data filed at 12/15/2021 0800 Gross per 24 hour  Intake 960 ml  Output 250 ml  Net 710 ml  Wt Readings from Last 3 Encounters:  12/13/21 84.2 kg  08/23/21 96.5 kg  08/07/21 101.1 kg    Physical Exam  Coherent alert no distress EOMI NCAT no focal deficit CTA B no added sound rales rhonchi or wheeze S1-S2 no murmur Abdomen soft no rebound Left lower extremity not reviewed today changes-not erythematous right-sided area of the lateral thigh diffuse overall swelling Neuro intact coherent  Data Reviewed:  I have personally reviewed following labs    CBC Lab Results  Component Value Date   WBC 6.7 12/13/2021   RBC 3.56 (L) 12/13/2021   HGB 9.8 (L) 12/13/2021   HCT 29.5 (L) 12/13/2021   MCV 82.9 12/13/2021   MCH 27.5 12/13/2021   PLT 217 12/13/2021   MCHC 33.2 12/13/2021   RDW 15.5 12/13/2021   LYMPHSABS 2.1 12/06/2021   MONOABS 1.0 12/06/2021   EOSABS 0.2 12/06/2021   BASOSABS 0.1 14/48/1856     Last metabolic panel Lab Results  Component Value Date   NA 128 (L) 12/13/2021   K 4.6 12/13/2021   CL 87 (L) 12/13/2021   CO2 26 12/13/2021   BUN 83 (H) 12/13/2021   CREATININE 6.20 (H) 12/13/2021   GLUCOSE 170 (H) 12/13/2021   GFRNONAA 7 (L) 12/13/2021   GFRAA 13 (L) 12/17/2018   CALCIUM 9.0 12/13/2021   PHOS 5.6 (H) 12/13/2021   PROT 7.6 12/02/2021   ALBUMIN 3.4 (L) 12/13/2021   LABGLOB 3.1 10/29/2018   AGRATIO 1.4 10/29/2018   BILITOT 0.6 12/02/2021   ALKPHOS 105 12/02/2021   AST 16 12/02/2021   ALT 15 12/02/2021    ANIONGAP 15 12/13/2021    CBG (last 3)  Recent Labs    12/14/21 2133 12/15/21 0723 12/15/21 1113  GLUCAP 204* 136* 193*       Coagulation Profile: No results for input(s): "INR", "PROTIME" in the last 168 hours.   Radiology Studies: I have personally reviewed the imaging studies  No results found.     Nita Sells M.D. [facility] 12/15/2021, 4:26 PM  Available via Epic secure chat 7am-7pm After 7 pm, please refer to night coverage provider listed on amion.

## 2021-12-15 NOTE — Care Plan (Signed)
CT Lower extremity ordered on this pt. Unable to reach RN. No RN attached in Epic and no answer when calling the floor.

## 2021-12-16 DIAGNOSIS — M86252 Subacute osteomyelitis, left femur: Secondary | ICD-10-CM | POA: Diagnosis not present

## 2021-12-16 LAB — RENAL FUNCTION PANEL
Albumin: 3.6 g/dL (ref 3.5–5.0)
Anion gap: 16 — ABNORMAL HIGH (ref 5–15)
BUN: 103 mg/dL — ABNORMAL HIGH (ref 6–20)
CO2: 25 mmol/L (ref 22–32)
Calcium: 9.1 mg/dL (ref 8.9–10.3)
Chloride: 86 mmol/L — ABNORMAL LOW (ref 98–111)
Creatinine, Ser: 6.77 mg/dL — ABNORMAL HIGH (ref 0.44–1.00)
GFR, Estimated: 7 mL/min — ABNORMAL LOW (ref >60)
Glucose, Bld: 127 mg/dL — ABNORMAL HIGH (ref 70–99)
Phosphorus: 6.2 mg/dL — ABNORMAL HIGH (ref 2.5–4.6)
Potassium: 4.8 mmol/L (ref 3.5–5.1)
Sodium: 127 mmol/L — ABNORMAL LOW (ref 135–145)

## 2021-12-16 LAB — CBC
HCT: 28.8 % — ABNORMAL LOW (ref 36.0–46.0)
Hemoglobin: 9.6 g/dL — ABNORMAL LOW (ref 12.0–15.0)
MCH: 27.6 pg (ref 26.0–34.0)
MCHC: 33.3 g/dL (ref 30.0–36.0)
MCV: 82.8 fL (ref 80.0–100.0)
Platelets: 250 10*3/uL (ref 150–400)
RBC: 3.48 MIL/uL — ABNORMAL LOW (ref 3.87–5.11)
RDW: 15.5 % (ref 11.5–15.5)
WBC: 7.8 10*3/uL (ref 4.0–10.5)
nRBC: 0 % (ref 0.0–0.2)

## 2021-12-16 LAB — GLUCOSE, CAPILLARY
Glucose-Capillary: 114 mg/dL — ABNORMAL HIGH (ref 70–99)
Glucose-Capillary: 168 mg/dL — ABNORMAL HIGH (ref 70–99)
Glucose-Capillary: 190 mg/dL — ABNORMAL HIGH (ref 70–99)
Glucose-Capillary: 251 mg/dL — ABNORMAL HIGH (ref 70–99)

## 2021-12-16 MED ORDER — HEPARIN SODIUM (PORCINE) 1000 UNIT/ML DIALYSIS: 1000.0000 [IU] | INTRAMUSCULAR | Status: DC | PRN

## 2021-12-16 MED ORDER — PROCHLORPERAZINE MALEATE 5 MG PO TABS
5.0000 mg | ORAL_TABLET | Freq: Four times a day (QID) | ORAL | Status: DC | PRN
  Administered 2021-12-16: 5 mg via ORAL
  Filled 2021-12-16 (×2): qty 1

## 2021-12-16 MED ORDER — PENTAFLUOROPROP-TETRAFLUOROETH EX AERO: 1.0000 | INHALATION_SPRAY | CUTANEOUS | Status: DC | PRN

## 2021-12-16 MED ORDER — HYDROXYZINE HCL 25 MG PO TABS
25.0000 mg | ORAL_TABLET | Freq: Two times a day (BID) | ORAL | Status: DC | PRN
  Administered 2021-12-16 – 2022-01-17 (×5): 25 mg via ORAL
  Filled 2021-12-16 (×7): qty 1

## 2021-12-16 MED ORDER — HEPARIN SODIUM (PORCINE) 1000 UNIT/ML DIALYSIS
4000.0000 [IU] | INTRAMUSCULAR | Status: DC | PRN
  Filled 2021-12-16: qty 4

## 2021-12-16 MED ORDER — PROCHLORPERAZINE EDISYLATE 10 MG/2ML IJ SOLN
5.0000 mg | Freq: Four times a day (QID) | INTRAMUSCULAR | Status: DC | PRN
  Administered 2021-12-17 – 2022-02-08 (×4): 5 mg via INTRAVENOUS
  Filled 2021-12-16 (×4): qty 2

## 2021-12-16 MED ORDER — ANTICOAGULANT SODIUM CITRATE 4% (200MG/5ML) IV SOLN
5.0000 mL | Status: DC | PRN
  Filled 2021-12-16: qty 5

## 2021-12-16 MED ORDER — ALTEPLASE 2 MG IJ SOLR: 2.0000 mg | Freq: Once | INTRAMUSCULAR | Status: DC | PRN

## 2021-12-16 MED ORDER — LIDOCAINE HCL (PF) 1 % IJ SOLN: 5.0000 mL | INTRAMUSCULAR | Status: DC | PRN

## 2021-12-16 MED ORDER — LIDOCAINE-PRILOCAINE 2.5-2.5 % EX CREA: 1.0000 | TOPICAL_CREAM | CUTANEOUS | Status: DC | PRN

## 2021-12-16 NOTE — Plan of Care (Incomplete)
  Problem: Activity: Goal: Risk for activity intolerance will decrease Outcome: Not Progressing   Problem: Pain Managment: Goal: General experience of comfort will improve Outcome: Not Progressing   

## 2021-12-16 NOTE — Progress Notes (Signed)
Received patient in bed to unit.  Alert and oriented.  Informed consent signed and in chart.   Treatment initiated: 1252 Treatment completed: 1605  Patient tolerated well.  Transported back to the room  Alert, without acute distress.  Hand-off given to patient's nurse.   Access used: [place] Access issues: none  Total UF removed: 4000 Medication(s) given: Compazine, atarax Post HD VS: 98.2, 130/62(82),HR-70, RR-18, SP02-99 Post HD weight: 90kg   [name] [facility]

## 2021-12-16 NOTE — Progress Notes (Signed)
[facility]                                                                            [name], is a 56 y.o. 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 Orthopedic surgery/infectious disease consulted. Patient was started on empiric antibiotics and underwent multiple I&Ds by Dr. [name] on 0/27, had application of Kerecis micro powder 7/1 with local tissue rearrangement 40 x 10 cm   She was also seen by vascular surgery who did a fistula branch ligation   Patient completed a 6 week course of daptomycin 10/09/21   Discharge is pending ability for patient to tolerate HD in a chair which is complicated by patient's refusal/inability to sit in a chair secondary to pain. Patient tolerated HD in a chair on 9/4.     Awaiting ability to sit up to allow for her to go to skilled facility-    Assessment & Plan   Thigh discomfort on R outer thigh--likely mild cellulitis  from dependant position on that leg with tissue fluid Started 10/10--No fluctuance-improved on its own some--doubt infectious Cannot tol doxycycline--getting non contrast CT of Thigh to ensure no infection but unlikely  ESRD on HD MWF/anasarca -Per nephrology.  Will reach out to [facility] to see what are the latest updates -Lasix 80 daily-[for hyperkalemia] continuing calcium carbonate 200 every 2 as needed, Renvela 2400 3 times daily--PRN labs with HD -Has to do HD in chair for outpatient HD--continue to work on this with PT  -I have spoken personally with PT and will discuss with [place] to encourage fluid restriction   Subacute osteomyelitis of left femur with abscess/infected left hip hardware s/p removal on 6/10 prior to admission with I&D on 6/28 and 7/1 -Completed 6 weeks of appropriate antibiotics per ID recommendation.  -CT hip on 9/2 with fluid collection and soft tissue thickening.   -Ortho rec-outpatient f/up with Dr. [name] 1 wk after discharge and follow-up -- re-image L knee 10/2 shows healing fracture with callus -pain control remains - changed to fentanyl patch, + Oxy IR 15 every 3 as needed -Therapy has updated goal for next 2 weeks  to work on improving sitting tolerance with sliding board transfers on Tuesdays and Thursdays--she has intermittent refusals of this   History of left intertrochanteric fracture: previously managed with IM nail placement in 2021. Hardware removed secondary to above.   Hyponatremia/Hypokalemia/Hyperkalemia/hypomagnesemia -Management with hemodialysis-continues to have mild hyperKL and hyponatremia-management with dialysis Lokelma TTSSu   ABLA superimposed on anemia of chronic disease: Stable. -Monitor intermittently   Controlled IDDM-2 with hyperglycemia and ESRD: A1c 5.9%. -Continue SSI-sensitive -Continue NovoLog 4 units 3 times daily with meals -Stable on Semglee 7 units at night -Glucose trends 114-168    Primary  hypertension -d/c amlodipine 10 mg, cont hydralazine 25 x 3 times daily and Coreg 6.25 twice daily   History of CVA/HLD -Continue Lipitor   GERD -Continue Pepcid and Protonix   Tobacco use -Tobacco cessation discussed during this admission.   Anxiety and depression: -Continue amitriptyline 25 Cymbalta DR 60 -Seems stable   Code Status: Full code DVT Prophylaxis:  Place TED hose Start: 09/06/21 1507 SCDs Start: 08/31/21 1058   Level of Care: Level of care: Med-Surg Family Communication: Updated patient- alone  Disposition Plan:      Remains inpatient appropriate: Difficult placement due to inability to sit in the chair for HD   Procedures:  Hemodialysis  Consultants:   Nephrology Orthopedics Discussed surgery  Antimicrobials:  ceftriaxone   Medications  (feeding supplement) PROSource Plus  30 mL Oral BID BM   alosetron  1 mg Oral BID   amitriptyline  25 mg Oral QHS   atorvastatin  40 mg Oral q1800   carvedilol  6.25 mg Oral BID   Chlorhexidine Gluconate Cloth  6 each Topical Q0600   cyclobenzaprine  10 mg Oral TID   darbepoetin (ARANESP) injection - DIALYSIS  25 mcg Intravenous Q Fri-HD   docusate sodium  200 mg Oral BID   DULoxetine  60 mg Oral Daily   famotidine  20 mg Oral Daily   fentaNYL  1 patch Transdermal Q72H   furosemide  80 mg Oral Daily   hydrALAZINE  25 mg Oral BID   insulin aspart  0-9 Units Subcutaneous TID WC   insulin aspart  4 Units Subcutaneous TID WC   insulin glargine-yfgn  7 Units Subcutaneous QHS   multivitamin  1 tablet Oral QHS   pantoprazole  80 mg Oral Daily   polyethylene glycol  17 g Oral Daily   sevelamer carbonate  2,400 mg Oral TID WC   sodium zirconium cyclosilicate  10 g Oral Once per day on Sun Tue Thu Sat      Subjective:   Does have right lower extremity swelling but think this is more edema than anything else I saw her on the dialysis unit She has no chest pain or fever She had some nausea earlier today and  is requesting IV Compazine  Objective:   Vitals:   12/16/21 1226 12/16/21 1227 12/16/21 1252 12/16/21 1300  BP:  (!) 144/62 (!) 144/56 (!) 146/62  Pulse:   (!) 56 (!) 56  Resp:  17 17 (!) 9  Temp:  98.3 F (36.8 C)    TempSrc:  Oral    SpO2:  97% 100% 100%  Weight: 93.9 kg     Height:        Intake/Output Summary (Last 24 hours) at 12/16/2021 1324 Last data filed at 12/16/2021 0600 Gross per 24 hour  Intake 900 ml  Output 0 ml  Net 900 ml      Wt Readings from  Last 3 Encounters:  12/16/21 93.9 kg  08/23/21 96.5 kg  08/07/21 101.1 kg    Physical Exam  alert no distress EOMI NCAT no focal deficit CTA B no added sound rales rhonchi or wheeze S1-S2 no murmur Abdomen soft no rebound right-sided area of the lateral thigh diffuse overall swelling Neuro intact coherent  Data Reviewed:  I have personally reviewed following labs    CBC Lab Results  Component Value Date   WBC 7.8 12/16/2021   RBC 3.48 (L) 12/16/2021   HGB 9.6 (L) 12/16/2021   HCT 28.8 (L) 12/16/2021   MCV 82.8 12/16/2021   MCH 27.6 12/16/2021   PLT 250 12/16/2021   MCHC 33.3 12/16/2021   RDW 15.5 12/16/2021   LYMPHSABS 2.1 12/06/2021   MONOABS 1.0 12/06/2021   EOSABS 0.2 12/06/2021   BASOSABS 0.1 87/68/1157     Last metabolic panel Lab Results  Component Value Date   NA 127 (L) 12/16/2021   K 4.8 12/16/2021   CL 86 (L) 12/16/2021   CO2 25 12/16/2021   BUN 103 (H) 12/16/2021   CREATININE 6.77 (H) 12/16/2021   GLUCOSE 127 (H) 12/16/2021   GFRNONAA 7 (L) 12/16/2021   GFRAA 13 (L) 12/17/2018   CALCIUM 9.1 12/16/2021   PHOS 6.2 (H) 12/16/2021   PROT 7.6 12/02/2021   ALBUMIN 3.6 12/16/2021   LABGLOB 3.1 10/29/2018   AGRATIO 1.4 10/29/2018   BILITOT 0.6 12/02/2021   ALKPHOS 105 12/02/2021   AST 16 12/02/2021   ALT 15 12/02/2021   ANIONGAP 16 (H) 12/16/2021    CBG (last 3)  Recent Labs    12/15/21 2119 12/16/21 0741 12/16/21 1118  GLUCAP 189* 114* 168*       Coagulation  Profile: No results for input(s): "INR", "PROTIME" in the last 168 hours.   Radiology Studies: I have personally reviewed the imaging studies  No results found.     Nita Sells M.D. [facility] 12/16/2021, 1:24 PM  Available via Epic secure chat 7am-7pm After 7 pm, please refer to night coverage provider listed on amion.

## 2021-12-16 NOTE — Progress Notes (Signed)
[facility] Progress Note   Subjective:   Patient seen and examined at bedside.  Reports nausea this AM.  Continues to have pain in right thigh which started over the weekend.  CT ordered to r/o infection. Reports she tolerated transfer on Thursday.  Does not feel like she can transfer today with pain in thigh.   Objective Vitals:   12/15/21 1640 12/15/21 2022 12/16/21 0539 12/16/21 0947  BP: (!) 121/54 (!) 120/52 (!) 142/62 (!) 145/54  Pulse: 60 62 (!) 59 62  Resp: 18 17 18 18   Temp: 98 F (36.7 C) 98.4 F (36.9 C) 97.9 F (36.6 C) 98.6 F (37 C)  TempSrc: Oral Oral Oral   SpO2: 98% 100% 100% 100%  Weight:      Height:       Physical Exam General:WDWN female in NAD Heart:RRR, no mrg Lungs:CTAB, nml WOB on RA Abdomen:soft, NTND, 1-2+flank edema Extremities:1+ R calf/thigh edema, trace edema on L stump Dialysis Access: RU AVF +b/t   Filed Weights   12/13/21 0739 12/13/21 1039 12/13/21 1145  Weight: 88 kg 87.4 kg 84.2 kg    Intake/Output Summary (Last 24 hours) at 12/16/2021 1113 Last data filed at 12/16/2021 0600 Gross per 24 hour  Intake 900 ml  Output 0 ml  Net 900 ml    Additional Objective Labs: Basic Metabolic Panel: Recent Labs  Lab 12/11/21 0138 12/13/21 0854 12/16/21 0346  NA 126* 128* 127*  K 4.2 4.6 4.8  CL 87* 87* 86*  CO2 25 26 25   GLUCOSE 133* 170* 127*  BUN 76* 83* 103*  CREATININE 5.32* 6.20* 6.77*  CALCIUM 9.8 9.0 9.1  PHOS 5.9* 5.6* 6.2*   Liver Function Tests: Recent Labs  Lab 12/09/21 1214 12/13/21 0854 12/16/21 0346  ALBUMIN 3.3* 3.4* 3.6   No results for input(s): "LIPASE", "AMYLASE" in the last 168 hours. CBC: Recent Labs  Lab 12/09/21 1214 12/11/21 0138 12/13/21 0854  WBC 6.7 7.1 6.7  HGB 10.1* 10.8* 9.8*  HCT 30.2* 31.7* 29.5*  MCV 82.3 81.7 82.9  PLT 180 212 217   CBG: Recent Labs  Lab 12/15/21 0723 12/15/21 1113 12/15/21 1733 12/15/21 2119 12/16/21 0741  GLUCAP 136* 193* 239* 189* 114*     Medications:  cefTRIAXone (ROCEPHIN)  IV 2 g (12/15/21 1858)   methocarbamol (ROBAXIN) IV      (feeding supplement) PROSource Plus  30 mL Oral BID BM   alosetron  1 mg Oral BID   amitriptyline  25 mg Oral QHS   atorvastatin  40 mg Oral q1800   carvedilol  6.25 mg Oral BID   Chlorhexidine Gluconate Cloth  6 each Topical Q0600   cyclobenzaprine  10 mg Oral TID   darbepoetin (ARANESP) injection - DIALYSIS  25 mcg Intravenous Q Fri-HD   docusate sodium  200 mg Oral BID   DULoxetine  60 mg Oral Daily   famotidine  20 mg Oral Daily   fentaNYL  1 patch Transdermal Q72H   furosemide  80 mg Oral Daily   heparin sodium (porcine)  4,000 Units Intravenous Once in dialysis   hydrALAZINE  25 mg Oral BID   insulin aspart  0-9 Units Subcutaneous TID WC   insulin aspart  4 Units Subcutaneous TID WC   insulin glargine-yfgn  7 Units Subcutaneous QHS   multivitamin  1 tablet Oral QHS   pantoprazole  80 mg Oral Daily   polyethylene glycol  17 g Oral Daily   sevelamer carbonate  2,400  mg Oral TID WC   sodium zirconium cyclosilicate  10 g Oral Once per day on Sun Tue Thu Sat    Dialysis Orders: MWF at [facility], 400/500, EDW 90kg, 2K/2Ca, LUE AVF - heparin 4000 unit IV bolus - No ESA   Problem/Plan:  Recurrent L hip osteomyelitis: S/p 08/10/21 surgery (removal hip IMN which was placed  12/2019) with persistent VRE infection. Back to OR 08/28/21, 08/31/21 for L hip debridement /wound vac  (now removed).  Completed 6 wk course of IV Daptomycin on 10/12/21. Management per primary team.  Chronic L hip/stump pain: She does have fractures of L femoral neck (where hardware was removed) as well as distal femur Fx. Pain control per primary.  R thigh pain - +edema, CT ordered to r/o infection.  ABX started.  ESRD: MWF HD on schedule. Hyperkalemia: Recurrent issues, despite being in the hospital, 2K bath + furosemide. Lokelma 10g added on non-HD days -> now prn. 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 Virl Cagey. AVF working well now. HTN/volume: BP stable on amlodipine 10 mg Q d (make hs),hydralazine 25 mg tid (will decrease to bid) /Coreg.  6.25 mg twice daily (hold a.m. of HD) Attempt taper DOWN  BP meds with UF on HD to prevent cramping low bp on HD.  LE and flank edema noted. Hx large gains. Stressed fluid and sodium restrictions.  Continue max UF as tolerated.  New EDW since admitted in the works. 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  Aranesp 33mcg qwk given 10/13 Secondary HPTH: CorrCa slightly high, not on VDRA. Phos variable, usually 5-6. Continue Renvela as binder, if continues to stay elevated, increase dose. Nutrition: Alb low, continue protein supplements. DM2: Insulin per primary Dispo: SNF previously recommended, now she would like to go home on discharge even though HD unit has been clear that they are prohibited by company policy to assist patient out of her car and into the facility. Patient is aware of this.  Not home HD candidate currently/ dialyzed in recliner on 9/4 but signed off early due to pain, has declined to come in a recliner since then.  We were informed by PT on 10/10 of patient being close to be discharged from PT service for lack of progress. PT and PA had a discussion with patient on 10/10 at bedside about the situation. Again strongly discussed on the importance of increasing her mobility as much as possible while she is in her room in addition to following her ongoing fluid restriction. Also reinforced the importance of being in the chair for HD treatments to improve strength and mobility. Patient agrees for the next 2-weeks to work extensively with PT in her room by increasing her time in the chair with each PT session. Our goal is to dialyze her in the mornings so the patient has more time to work with PT in the afternoons (we will do our best to accommodate this as we still need to always consider the emergent cases).  Per Renal Navigator notes  there are no clinics that will accept patient unless she will have HD in chair. Will continue to encourage use of chair for HD  Jen Mow, PA-C [place] 12/16/2021,11:13 AM  LOS: 112 days

## 2021-12-16 NOTE — Procedures (Signed)
Patient was seen on dialysis and the procedure was supervised.  BFR 400  Via AVF BP is  122/68.   Patient appears to be tolerating treatment well  Louis Meckel 12/16/2021

## 2021-12-17 ENCOUNTER — Inpatient Hospital Stay (HOSPITAL_COMMUNITY): Payer: Medicare Other

## 2021-12-17 DIAGNOSIS — M86252 Subacute osteomyelitis, left femur: Secondary | ICD-10-CM | POA: Diagnosis not present

## 2021-12-17 LAB — GLUCOSE, CAPILLARY
Glucose-Capillary: 167 mg/dL — ABNORMAL HIGH (ref 70–99)
Glucose-Capillary: 172 mg/dL — ABNORMAL HIGH (ref 70–99)
Glucose-Capillary: 125 mg/dL — ABNORMAL HIGH (ref 70–99)
Glucose-Capillary: 180 mg/dL — ABNORMAL HIGH (ref 70–99)

## 2021-12-17 MED ORDER — CHLORHEXIDINE GLUCONATE CLOTH 2 % EX PADS: 6.0000 | MEDICATED_PAD | Freq: Every day | CUTANEOUS | Status: DC

## 2021-12-17 NOTE — Progress Notes (Signed)
[facility] Progress Note   Subjective:   Patient seen and examined at bedside.  Itching better this AM.  Plans to work with PT at 12:15 today.  Flank edema slightly improved.  Pain in R thigh about the same.  Denies CP, SOB, abdominal pain and n/v/d.   Objective Vitals:   12/16/21 1702 12/16/21 2120 12/17/21 0538 12/17/21 0940  BP: (!) 123/59 (!) 91/38 (!) 132/59 (!) 126/58  Pulse: 69 64 67 67  Resp: 16 18 18 17   Temp: 97.8 F (36.6 C) 98.4 F (36.9 C) 98.3 F (36.8 C) 98.5 F (36.9 C)  TempSrc: Oral Oral Oral Oral  SpO2: 100% 98% 98% 100%  Weight:      Height:       Physical Exam General:WDWn female in NAD Heart:RRR, no mrg Lungs:CTAB, nml WOB on RA Abdomen:soft, NTND, 1+ flank edema Extremities:1+ R calf.thigh edema, trace edema on L stump Dialysis Access: RU AVF +b/t   Filed Weights   12/13/21 1145 12/16/21 1226 12/16/21 1643  Weight: 84.2 kg 93.9 kg 90 kg    Intake/Output Summary (Last 24 hours) at 12/17/2021 0954 Last data filed at 12/17/2021 0601 Gross per 24 hour  Intake 1140 ml  Output 4300 ml  Net -3160 ml    Additional Objective Labs: Basic Metabolic Panel: Recent Labs  Lab 12/11/21 0138 12/13/21 0854 12/16/21 0346  NA 126* 128* 127*  K 4.2 4.6 4.8  CL 87* 87* 86*  CO2 25 26 25   GLUCOSE 133* 170* 127*  BUN 76* 83* 103*  CREATININE 5.32* 6.20* 6.77*  CALCIUM 9.8 9.0 9.1  PHOS 5.9* 5.6* 6.2*   Liver Function Tests: Recent Labs  Lab 12/13/21 0854 12/16/21 0346  ALBUMIN 3.4* 3.6   CBC: Recent Labs  Lab 12/11/21 0138 12/13/21 0854 12/16/21 0346  WBC 7.1 6.7 7.8  HGB 10.8* 9.8* 9.6*  HCT 31.7* 29.5* 28.8*  MCV 81.7 82.9 82.8  PLT 212 217 250   CBG: Recent Labs  Lab 12/16/21 0741 12/16/21 1118 12/16/21 1659 12/16/21 2119 12/17/21 0738  GLUCAP 114* 168* 190* 251* 167*   Medications:  cefTRIAXone (ROCEPHIN)  IV 2 g (12/16/21 1710)   methocarbamol (ROBAXIN) IV      (feeding supplement) PROSource Plus  30 mL  Oral BID BM   alosetron  1 mg Oral BID   amitriptyline  25 mg Oral QHS   atorvastatin  40 mg Oral q1800   carvedilol  6.25 mg Oral BID   cyclobenzaprine  10 mg Oral TID   darbepoetin (ARANESP) injection - DIALYSIS  25 mcg Intravenous Q Fri-HD   docusate sodium  200 mg Oral BID   DULoxetine  60 mg Oral Daily   famotidine  20 mg Oral Daily   fentaNYL  1 patch Transdermal Q72H   furosemide  80 mg Oral Daily   hydrALAZINE  25 mg Oral BID   insulin aspart  0-9 Units Subcutaneous TID WC   insulin aspart  4 Units Subcutaneous TID WC   insulin glargine-yfgn  7 Units Subcutaneous QHS   multivitamin  1 tablet Oral QHS   pantoprazole  80 mg Oral Daily   polyethylene glycol  17 g Oral Daily   sevelamer carbonate  2,400 mg Oral TID WC   sodium zirconium cyclosilicate  10 g Oral Once per day on Sun Tue Thu Sat    Dialysis Orders: MWF at [facility], 400/500, EDW 90kg, 2K/2Ca, LUE AVF - heparin 4000 unit IV bolus - No  ESA   Problem/Plan:  Recurrent L hip osteomyelitis: S/p 08/10/21 surgery (removal hip IMN which was placed  12/2019) with persistent VRE infection. Back to OR 08/28/21, 08/31/21 for L hip debridement /wound vac  (now removed).  Completed 6 wk course of IV Daptomycin on 10/12/21. Management per primary team.  Chronic L hip/stump pain: She does have fractures of L femoral neck (where hardware was removed) as well as distal femur Fx. Pain control per primary.  R thigh pain - +edema, CT ordered to r/o infection.  ABX started.  ESRD: MWF HD on schedule.  HD tomorrow per regular schedule.  Hyperkalemia: Recurrent issues, despite being in the hospital, 2K bath + furosemide. Lokelma 10g added on non-HD days -> now prn. 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 Virl Cagey. AVF working well now. HTN/volume: BP stable on amlodipine 10 mg Q d (make hs),hydralazine 25 mg tid (will decrease to bid) /Coreg.  6.25 mg twice daily (hold a.m. of HD) Attempt taper DOWN  BP meds with UF  on HD to prevent cramping low bp on HD.  LE and flank edema noted. Hx large gains. Stressed fluid and sodium restrictions.  Continue max UF as tolerated.  New EDW since admitted in the works. 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  Aranesp 6mcg qwk given 10/13 Secondary HPTH: CorrCa slightly high, not on VDRA. Phos variable, usually 5-6. Continue Renvela as binder, if continues to stay elevated, increase dose. Nutrition: Alb low, continue protein supplements. DM2: Insulin per primary Dispo: SNF previously recommended, now she would like to go home on discharge even though HD unit has been clear that they are prohibited by company policy to assist patient out of her car and into the facility. Patient is aware of this.  Not home HD candidate currently/ dialyzed in recliner on 9/4 but signed off early due to pain, has declined to come in a recliner since then.  We were informed by PT on 10/10 of patient being close to be discharged from PT service for lack of progress. PT and PA had a discussion with patient on 10/10 at bedside about the situation. Again strongly discussed on the importance of increasing her mobility as much as possible while she is in her room in addition to following her ongoing fluid restriction. Also reinforced the importance of being in the chair for HD treatments to improve strength and mobility. Patient agrees for the next 2-weeks to work extensively with PT in her room by increasing her time in the chair with each PT session. Our goal is to dialyze her in the mornings so the patient has more time to work with PT in the afternoons (we will do our best to accommodate this as we still need to always consider the emergent cases).  Per Renal Navigator notes there are no clinics that will accept patient unless she will have HD in chair. Will continue to encourage use of chair for HD  Jen Mow, PA-C [facility] 12/17/2021,9:54 AM  LOS: 113 days

## 2021-12-17 NOTE — Plan of Care (Signed)
  Problem: Activity: Goal: Risk for activity intolerance will decrease Outcome: Progressing   Problem: Pain Managment: Goal: General experience of comfort will improve Outcome: Progressing   

## 2021-12-17 NOTE — Progress Notes (Signed)
Physical Therapy Treatment Patient Details Name: [name] MRN: [id number] DOB: 05/10/1965 Today's Date: 12/17/2021   History of Present Illness 56 y.o. female presented to ED 6/26 from dialysis with increased bloody drainage from L hip wound. 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 s/p hardware removal of left hip with partial resection of tuft tissue and femure that were nonviable on 08/10/21 Discharged home 6/23. underwent L hip debridement and wound vac placement 6/28; +Left intertrochanteric non-union,  Fracture of left distal femur  PMH: hypertension, hyperlipidemia, ESRD on HD MWF, history of left BKA in 2021, depression/anxiety, stroke, tobacco use, T2DM,  insomnia, chronic pain syndrome,    PT Comments    Patient pre-medicated one hour prior to session. Reports pain 8/10 and willing to work with PT. Completed AAROM to LLE for knee extension and hip internal rotation in preparation for future use of LLE prosthesis. Completed transfer OOB to recliner with overall min assist (requires assist to place sliding board and anchor board as she begins transfer). Agrees to OOB x 2 hours today with Mobility Team to assist her back to bed.    Recommendations for follow up therapy are one component of a multi-disciplinary discharge planning process, led by the attending physician.  Recommendations may be updated based on patient status, additional functional criteria and insurance authorization.  Follow Up Recommendations  Skilled nursing-short term rehab (<3 hours/day) (pt currently refusing due to inability to tolerate sitting in chair for 6+ hours as transported to/from dialysis) Can patient physically be transported by private vehicle: No   Assistance Recommended at Discharge Intermittent Supervision/Assistance  Patient can return home with the following A lot of help with bathing/dressing/bathroom;Assistance with cooking/housework;Assist for  transportation;Two people to help with walking and/or transfers;Help with stairs or ramp for entrance   Equipment Recommendations  None recommended by PT    Recommendations for Other Services       Precautions / Restrictions Precautions Precautions: Fall;Other (comment) Precaution Comments: L BKA (baseline) Restrictions Weight Bearing Restrictions: Yes LLE Weight Bearing: Weight bearing as tolerated Other Position/Activity Restrictions: L distal femur fx; per Dr [name] 8/10 pt can full WB     Mobility  Bed Mobility Overal bed mobility: Needs Assistance Bed Mobility: Supine to Sit, Rolling Rolling: Modified independent (Device/Increase time) (to left for left hip internal rotation stretch (held x 30 seconds); rt and lrft for placing pad prior to sliding board transfer)   Supine to sit: Modified independent (Device/Increase time)     General bed mobility comments: HOB flat, +rail    Transfers Overall transfer level: Needs assistance   Transfers: Bed to chair/wheelchair/BSC            Lateral/Scoot Transfers: Min assist, With slide board General transfer comment: pt attempted to place sliding board in prep for transfer wiht LOB x 2 posteriorly and ultimately requires assist to place board under Rt hip and to anchor board as she first starts scooting across board; pt scoots/moves her body without assist    Ambulation/Gait                   Stairs             Wheelchair Mobility    Modified Rankin (Stroke Patients Only)       Balance Overall balance assessment: Needs assistance Sitting-balance support: Feet supported Sitting balance-Leahy Scale: Fair Sitting balance - Comments: LOB posteriorly when trying to place sliding board herself  Cognition Arousal/Alertness: Awake/alert Behavior During Therapy: WFL for tasks assessed/performed Overall Cognitive Status: Impaired/Different from baseline Area  of Impairment: Awareness                         Safety/Judgement: Decreased awareness of deficits, Decreased awareness of safety Awareness: Emergent   General Comments: continues to push for going home with OP HD despite inability to transfer to/from car        Exercises General Exercises - Lower Extremity Ankle Circles/Pumps: AROM, Right, 20 reps Heel Slides: AROM, Right, 15 reps Other Exercises Other Exercises: left knee contract-relax stretching into extension with hold x 60 seconds x 2 sets Other Exercises: left hip contract-relax into internal rotation with prolonged stretch x 60 seconds x 2 sets Other Exercises: pt did not have bolster under left lateral thigh (was across the room on the couch); reports she has been working on rolling to her left and holding for hip IR stretch and that this has been getting easier    General Comments        Pertinent Vitals/Pain Pain Assessment Pain Assessment: 0-10 Faces Pain Scale: Hurts whole lot Pain Location: left hip primarily Pain Descriptors / Indicators: Discomfort, Grimacing, Guarding Pain Intervention(s): Limited activity within patient's tolerance, Monitored during session, Premedicated before session, Repositioned    Home Living                          Prior Function            PT Goals (current goals can now be found in the care plan section) Acute Rehab PT Goals Patient Stated Goal: less pain with movement Time For Goal Achievement: 12/24/21 Potential to Achieve Goals: Fair Progress towards PT goals: Progressing toward goals    Frequency    Min 2X/week      PT Plan Current plan remains appropriate    Co-evaluation              AM-PAC PT "6 Clicks" Mobility   Outcome Measure  Help needed turning from your back to your side while in a flat bed without using bedrails?: None Help needed moving from lying on your back to sitting on the side of a flat bed without using bedrails?:  None Help needed moving to and from a bed to a chair (including a wheelchair)?: A Little Help needed standing up from a chair using your arms (e.g., wheelchair or bedside chair)?: Total Help needed to walk in hospital room?: Total Help needed climbing 3-5 steps with a railing? : Total 6 Click Score: 14    End of Session   Activity Tolerance: Patient limited by pain Patient left: with call bell/phone within reach;in chair Nurse Communication: Mobility status;Other (comment) (MT plans to put pt back to bed at 2:30-3:00) PT Visit Diagnosis: Other abnormalities of gait and mobility (R26.89);Muscle weakness (generalized) (M62.81);History of falling (Z91.81);Pain Pain - Right/Left: Left Pain - part of body: Leg     Time: 0962-8366 PT Time Calculation (min) (ACUTE ONLY): 32 min  Charges:  $Therapeutic Exercise: 8-22 mins $Therapeutic Activity: 8-22 mins                      Arby Barrette, PT Acute Rehabilitation Services  Office [phone]    [name] 12/17/2021, 1:35 PM

## 2021-12-17 NOTE — Progress Notes (Signed)
[facility]                                                                            [name], is a 56 y.o. female, DOB - [birth year], NOB:[id number] Admit date - 08/26/2021    Outpatient Primary MD for the patient is [name], MD  LOS - 113  days  Chief Complaint  Patient presents with   Wound Infection       Brief summary   56 year old white female HTN HLD IDDM ESRD MWF Left BKA 2021, 01/12/2020 for chronic calcaneus osteomyelitis (prior toe amputation [place] potation 2014 2015) status post IM nail 12/29/2019--underwent irrigation debridement left hip previously 01/19/2020 and was treated with ertapenem 2/2 Enterobacter/ Bacteroides Bipolar   Patient fell hit while trying to transfer from wheelchair to recliner 5/19 witnessed--found to have L distal femoral fracture deemed nonoperative she was admitted 6/3-through 6/23 and underwent partial resection of femur for osteomyelitis of infected soft tissue and rearrangement of bony fragments 20 x 15 cm and was on antibiotics as guided by ID for cultures growing Staph aureus     Patient presented secondary to copious left hip bleeding and found to have evidence of subacute osteomyelitis and abscess of left femur. Orthopedic surgery/infectious disease consulted. Patient was started on empiric antibiotics and underwent multiple I&Ds by Dr. [name] on 9/47, had application of Kerecis micro powder 7/1 with local tissue rearrangement 40 x 10 cm   She was also seen by vascular surgery who did a fistula branch ligation   Patient completed a 6 week course of daptomycin 10/09/21   Discharge is pending ability for patient to tolerate HD in a chair Patient tolerated HD in a chair on 9/4.  Discussed with therapy-they feel that her intermittent refusal to sit have been because she does not want to pass stool while seated for several hours  She was able to sit up in the chair for more than an hour on 10/17-therapy myself-encouraged the  patient to attempt to sit every day at least for 2 hours as there is danger that we will sign off and we have given her 7 to 10-day limitation on time to improve to the point that we can make decision about skilled placement versus home with possible dialysis as an outpatient if her mobility improves enough for that      Assessment & Plan   Thigh discomfort on R outer thigh--likely mild cellulitis from dependant position on that leg with tissue fluid Started 10/10--No fluctuance-improved on its own some--doubt infectious Cannot tol doxycycline--cancelled Ct order--improved and likely from anasarca  ESRD on HD MWF/anasarca -Lasix 80 daily-[for hyperkalemia] continuing calcium carbonate 200 every 2 as needed, Renvela 2400 3 times daily--PRN labs with HD -Has to do HD in chair for outpatient HD--continue to work on this with PT  -Patient now more amenable and agreeable to try to sit up in chair see above discussion-plan is for disposition placement around 10/24 outside of the hospital either home or skilled -Cont to encourage fluid restriction   Subacute osteomyelitis of left femur with abscess/infected left hip hardware s/p removal on 6/10 prior to admission with I&D on  6/28 and 7/1 -Completed 6 weeks of appropriate antibiotics per ID recommendation.  -CT hip on 9/2 with fluid collection and soft tissue thickening.   -Ortho rec-outpatient f/up with Dr. [name] 1 wk after discharge and follow-up -- re-image L knee 10/2 shows healing fracture with callus -pain control remains - changed to fentanyl patch, + Oxy IR 15 every 3 as needed  History of left intertrochanteric fracture: previously managed with IM nail placement in 2021. Hardware removed secondary to above.   Hyponatremia/Hypokalemia/Hyperkalemia/hypomagnesemia -Management with hemodialysis-continues to have mild hyperKL and hyponatremia-management with dialysis Lokelma TTSSu   ABLA superimposed on anemia of chronic disease:  Stable. -Monitor intermittently   Controlled IDDM-2 with hyperglycemia and ESRD: A1c 5.9%. -Continue SSI-sensitive -Continue NovoLog 4 units 3 times daily with meals -Stable on Semglee 7 units at night -Glucose t mostly below 180 and above 100 eating 100% of meals-no changes    Primary hypertension -d/c amlodipine 10 mg, cont hydralazine 25 x 3 times daily and Coreg 6.25 twice daily   History of CVA/HLD -Continue Lipitor   GERD -Continue Pepcid and Protonix   Tobacco use -Tobacco cessation discussed during this admission.   Anxiety and depression: -Continue amitriptyline 25 Cymbalta DR 60 -Seems stable   Code Status: Full code DVT Prophylaxis:  Place TED hose Start: 09/06/21 1507 SCDs Start: 08/31/21 1058   Level of Care: Level of care: Med-Surg Family Communication: Updated patient- alone  Disposition Plan:      Remains inpatient appropriate: Difficult placement due to inability to sit in the chair for HD see above notes in red   Procedures:  Hemodialysis  Consultants:   Nephrology Orthopedics Discussed surgery  Antimicrobials:  ceftriaxone   Medications  (feeding supplement) PROSource Plus  30 mL Oral BID BM   alosetron  1 mg Oral BID   amitriptyline  25 mg Oral QHS   atorvastatin  40 mg Oral q1800   carvedilol  6.25 mg Oral BID   Chlorhexidine Gluconate Cloth  6 each Topical Q0600   cyclobenzaprine  10 mg Oral TID   darbepoetin (ARANESP) injection - DIALYSIS  25 mcg Intravenous Q Fri-HD   docusate sodium  200 mg Oral BID   DULoxetine  60 mg Oral Daily   famotidine  20 mg Oral Daily   fentaNYL  1 patch Transdermal Q72H   furosemide  80 mg Oral Daily   hydrALAZINE  25 mg Oral BID   insulin aspart  0-9 Units Subcutaneous TID WC   insulin aspart  4 Units Subcutaneous TID WC   insulin glargine-yfgn  7 Units Subcutaneous QHS   multivitamin  1 tablet Oral QHS   pantoprazole  80 mg Oral Daily   polyethylene glycol  17 g Oral Daily   sevelamer  carbonate  2,400 mg Oral TID WC   sodium zirconium cyclosilicate  10 g Oral Once per day on Sun Tue Thu Sat      Subjective:   Seen on HD unit right lower extremity swelling continues to improve She was seen sitting in the chair today and I congratulated her vigorously-I do think that she needs continued encouragement to set-therapy is also spoken with in great detail-we have come up with a plan wearing in the next 7 to 10 days she should be able to sit up for enough time to possibly either discharge to skilled or home She has no complaints today  Objective:   Vitals:   12/16/21 1702 12/16/21 2120 12/17/21 0538 12/17/21 0940  BP: Marland Kitchen)  123/59 (!) 91/38 (!) 132/59 (!) 126/58  Pulse: 69 64 67 67  Resp: 16 18 18 17   Temp: 97.8 F (36.6 C) 98.4 F (36.9 C) 98.3 F (36.8 C) 98.5 F (36.9 C)  TempSrc: Oral Oral Oral Oral  SpO2: 100% 98% 98% 100%  Weight:      Height:        Intake/Output Summary (Last 24 hours) at 12/17/2021 1708 Last data filed at 12/17/2021 1400 Gross per 24 hour  Intake 1080 ml  Output 300 ml  Net 780 ml      Wt Readings from Last 3 Encounters:  12/16/21 90 kg  08/23/21 96.5 kg  08/07/21 101.1 kg    Physical Exam  Coherent awake no distress Chest is clear Right lower extremity and thigh is only slightly tender not red Abdomen is soft ROM is intact I did not examine left hip today  Data Reviewed:  I have personally reviewed following labs    CBC Lab Results  Component Value Date   WBC 7.8 12/16/2021   RBC 3.48 (L) 12/16/2021   HGB 9.6 (L) 12/16/2021   HCT 28.8 (L) 12/16/2021   MCV 82.8 12/16/2021   MCH 27.6 12/16/2021   PLT 250 12/16/2021   MCHC 33.3 12/16/2021   RDW 15.5 12/16/2021   LYMPHSABS 2.1 12/06/2021   MONOABS 1.0 12/06/2021   EOSABS 0.2 12/06/2021   BASOSABS 0.1 09/32/3557     Last metabolic panel Lab Results  Component Value Date   NA 127 (L) 12/16/2021   K 4.8 12/16/2021   CL 86 (L) 12/16/2021   CO2 25  12/16/2021   BUN 103 (H) 12/16/2021   CREATININE 6.77 (H) 12/16/2021   GLUCOSE 127 (H) 12/16/2021   GFRNONAA 7 (L) 12/16/2021   GFRAA 13 (L) 12/17/2018   CALCIUM 9.1 12/16/2021   PHOS 6.2 (H) 12/16/2021   PROT 7.6 12/02/2021   ALBUMIN 3.6 12/16/2021   LABGLOB 3.1 10/29/2018   AGRATIO 1.4 10/29/2018   BILITOT 0.6 12/02/2021   ALKPHOS 105 12/02/2021   AST 16 12/02/2021   ALT 15 12/02/2021   ANIONGAP 16 (H) 12/16/2021    CBG (last 3)  Recent Labs    12/17/21 0738 12/17/21 1122 12/17/21 1617  GLUCAP 167* 172* 125*       Coagulation Profile: No results for input(s): "INR", "PROTIME" in the last 168 hours.   Radiology Studies: I have personally reviewed the imaging studies  No results found.     Nita Sells M.D. [facility] 12/17/2021, 5:08 PM  Available via Epic secure chat 7am-7pm After 7 pm, please refer to night coverage provider listed on amion.

## 2021-12-17 NOTE — Plan of Care (Signed)
  Problem: Pain Managment: Goal: General experience of comfort will improve Outcome: Progressing   

## 2021-12-18 DIAGNOSIS — N186 End stage renal disease: Secondary | ICD-10-CM | POA: Diagnosis not present

## 2021-12-18 DIAGNOSIS — I10 Essential (primary) hypertension: Secondary | ICD-10-CM | POA: Diagnosis not present

## 2021-12-18 DIAGNOSIS — M86252 Subacute osteomyelitis, left femur: Secondary | ICD-10-CM | POA: Diagnosis not present

## 2021-12-18 DIAGNOSIS — Z992 Dependence on renal dialysis: Secondary | ICD-10-CM | POA: Diagnosis not present

## 2021-12-18 LAB — RENAL FUNCTION PANEL
Albumin: 3.3 g/dL — ABNORMAL LOW (ref 3.5–5.0)
Anion gap: 17 — ABNORMAL HIGH (ref 5–15)
BUN: 82 mg/dL — ABNORMAL HIGH (ref 6–20)
CO2: 23 mmol/L (ref 22–32)
Calcium: 9.4 mg/dL (ref 8.9–10.3)
Chloride: 87 mmol/L — ABNORMAL LOW (ref 98–111)
Creatinine, Ser: 6.6 mg/dL — ABNORMAL HIGH (ref 0.44–1.00)
GFR, Estimated: 7 mL/min — ABNORMAL LOW (ref >60)
Glucose, Bld: 162 mg/dL — ABNORMAL HIGH (ref 70–99)
Phosphorus: 6.7 mg/dL — ABNORMAL HIGH (ref 2.5–4.6)
Potassium: 4.1 mmol/L (ref 3.5–5.1)
Sodium: 127 mmol/L — ABNORMAL LOW (ref 135–145)

## 2021-12-18 LAB — GLUCOSE, CAPILLARY
Glucose-Capillary: 135 mg/dL — ABNORMAL HIGH (ref 70–99)
Glucose-Capillary: 160 mg/dL — ABNORMAL HIGH (ref 70–99)
Glucose-Capillary: 152 mg/dL — ABNORMAL HIGH (ref 70–99)
Glucose-Capillary: 167 mg/dL — ABNORMAL HIGH (ref 70–99)

## 2021-12-18 LAB — CBC
HCT: 27.2 % — ABNORMAL LOW (ref 36.0–46.0)
Hemoglobin: 9 g/dL — ABNORMAL LOW (ref 12.0–15.0)
MCH: 27.9 pg (ref 26.0–34.0)
MCHC: 33.1 g/dL (ref 30.0–36.0)
MCV: 84.2 fL (ref 80.0–100.0)
Platelets: 242 10*3/uL (ref 150–400)
RBC: 3.23 MIL/uL — ABNORMAL LOW (ref 3.87–5.11)
RDW: 15.7 % — ABNORMAL HIGH (ref 11.5–15.5)
WBC: 7.8 10*3/uL (ref 4.0–10.5)
nRBC: 0 % (ref 0.0–0.2)

## 2021-12-18 MED ORDER — DARBEPOETIN ALFA 60 MCG/0.3ML IJ SOSY
60.0000 ug | PREFILLED_SYRINGE | INTRAMUSCULAR | Status: DC
  Filled 2021-12-18: qty 0.3

## 2021-12-18 MED ORDER — LIDOCAINE-PRILOCAINE 2.5-2.5 % EX CREA: 1.0000 | TOPICAL_CREAM | CUTANEOUS | Status: DC | PRN

## 2021-12-18 MED ORDER — LIDOCAINE HCL (PF) 1 % IJ SOLN: 5.0000 mL | INTRAMUSCULAR | Status: DC | PRN

## 2021-12-18 MED ORDER — PENTAFLUOROPROP-TETRAFLUOROETH EX AERO: 1.0000 | INHALATION_SPRAY | CUTANEOUS | Status: DC | PRN

## 2021-12-18 MED ORDER — HEPARIN SODIUM (PORCINE) 1000 UNIT/ML DIALYSIS
4000.0000 [IU] | INTRAMUSCULAR | Status: DC | PRN
  Administered 2021-12-18: 4000 [IU] via INTRAVENOUS_CENTRAL
  Filled 2021-12-18: qty 4

## 2021-12-18 NOTE — Progress Notes (Addendum)
   12/18/21 1226  Vitals  Temp 97.7 F (36.5 C)  Temp Source Oral  BP 139/62  MAP (mmHg) 85  BP Location Right Arm  BP Method Automatic  Patient Position (if appropriate) Lying  Pulse Rate 68  Pulse Rate Source Monitor  ECG Heart Rate 68  Resp (!) 9  Oxygen Therapy  SpO2 100 %  O2 Device Room Air  Pulse Oximetry Type Continuous   Received patient in bed to unit.  Alert and oriented.  Informed consent signed and in chart.   Treatment initiated:   0810 Treatment completed: 1222  Patient tolerated well.  Transported back to the room  Alert, without acute distress.  Hand-off given to patient's nurse.   Access used: AVF Access issues: NA  Total UF removed: 4500 ml Medication(s) given: Heparin 4000 units bolus at beginning of tx Post HD VS: see above Post HD weight: 88.7kg   [facility]

## 2021-12-18 NOTE — Progress Notes (Addendum)
PROGRESS NOTE    [name]  WIO:[id number] DOB: 12/05/1965 DOA: 08/26/2021 PCP: [name], MD   Brief Narrative:  56 year old female with history of hypertension, hyperlipidemia, diabetes mellitus type 2, end-stage renal disease on hemodialysis, left BKA for chronic calcaneus osteomyelitis on 01/12/2020, bipolar disorder, left distal femoral fracture after a fall on 07/19/2021 deemed a nonoperative, admission from 08/03/2021-08/23/2021 for left humeral osteomyelitis requiring partial resection of femur and rearrangements of bony fragments with subsequent antibiotic treatment presented with left hip bleeding and was found to have subacute osteomyelitis and abscess of left femur.  Orthopedic/ID were consulted.  She was started on broad-spectrum antibiotics.  Underwent multiple I&D's by Dr. [name] along with local tissue rearrangement.  She was also seen by vascular surgery who did not fistula branch ligation.  She has completed 6-week course of daptomycin on 10/09/2021.  Discharge is pending ability for patient to tolerate hemodialysis in a chair.  Assessment & Plan:   Subacute osteomyelitis of left femur with abscess -Status post surgical intervention by Dr. [name].  She has completed 6-week course of daptomycin on 10/09/2021.  Outpatient follow-up with Dr. [name] 1 week after discharge -Continue current pain management -Reimaging on 12/02/2021 of left knee showed healing fracture with callus  Right thigh mild cellulitis -Improving.  Currently on Rocephin.  Symptoms most likely from anasarca.  History of left intertrochanteric fracture -Left hip hardware removed on 08/10/2021 prior to admission   End-stage renal disease on hemodialysis Anasarca -Nephrology following.  Dialysis as per nephrology schedule.  Still on Lasix.  Strict input and output with daily weights.  Fluid restriction -Has to do hemodialysis in chair prior to discharge to outpatient hemodialysis  Hyponatremia -Management  hemodialysis  Hyperphosphatemia--continue sevelamer.  Nephrology following.  Anemia of chronic disease -Hemoglobin currently stable.  Monitor intermittently  Diabetes mellitus type 2 with hyperglycemia -Continue long-acting insulin, NovoLog with meals along with CBGs with SSI  Hypertension -Blood pressure.  Continue Coreg, hydralazine.  Off amlodipine  History of unspecified CVA Hyperlipidemia -Continue Lipitor  GERD -Continue Protonix and Pepcid  Tobacco use -Tobacco cessation was discussed by prior hospitalist during this hospitalization  Anxiety and depression -Continue amitriptyline and Cymbalta  Goals of care -Prognosis is guarded to poor.  Remains full code.  Palliative care has already evaluated the patient during this hospitalization   DVT prophylaxis: SCDs Code Status: Full Family Communication: None at bedside Disposition Plan: Status is: Inpatient Remains inpatient appropriate because: Difficult placement due to inability to sit in chair for hemodialysis  Consultants: Orthopedics/nephrology/vascular surgery/ID  Procedures: As above  Antimicrobials:  Anti-infectives (From admission, onward)    Start     Dose/Rate Route Frequency Ordered Stop   12/15/21 1730  cefTRIAXone (ROCEPHIN) 2 g in sodium chloride 0.9 % 100 mL IVPB        2 g 200 mL/hr over 30 Minutes Intravenous Every 24 hours 12/15/21 1633     12/14/21 1800  doxycycline (VIBRA-TABS) tablet 100 mg  Status:  Discontinued        100 mg Oral Every 12 hours 12/14/21 1607 12/15/21 1633   10/02/21 2000  DAPTOmycin (CUBICIN) 900 mg in sodium chloride 0.9 % IVPB  Status:  Discontinued        10 mg/kg  92.4 kg 136 mL/hr over 30 Minutes Intravenous Once per day on Mon Wed Fri 10/01/21 1350 10/12/21 1146   09/30/21 2000  DAPTOmycin (CUBICIN) 900 mg in sodium chloride 0.9 % IVPB  Status:  Discontinued  10 mg/kg  92.4 kg 136 mL/hr over 30 Minutes Intravenous Daily 09/28/21 0919 10/01/21 1350    09/28/21 2000  DAPTOmycin (CUBICIN) 900 mg in sodium chloride 0.9 % IVPB        900 mg 136 mL/hr over 30 Minutes Intravenous  Once 09/28/21 0919 09/28/21 2146   09/02/21 2000  DAPTOmycin (CUBICIN) 900 mg in sodium chloride 0.9 % IVPB  Status:  Discontinued        10 mg/kg  92.4 kg 136 mL/hr over 30 Minutes Intravenous Once per day on Mon Wed Fri 09/02/21 0836 09/28/21 0919   08/31/21 0745  ceFAZolin (ANCEF) IVPB 2g/100 mL premix        2 g 200 mL/hr over 30 Minutes Intravenous To Short Stay 08/31/21 0730 08/31/21 0918   08/30/21 0600  ceFAZolin (ANCEF) IVPB 2g/100 mL premix  Status:  Discontinued        2 g 200 mL/hr over 30 Minutes Intravenous To Short Stay 08/30/21 0123 08/31/21 0600   08/29/21 2000  DAPTOmycin (CUBICIN) 900 mg in sodium chloride 0.9 % IVPB  Status:  Discontinued        10 mg/kg  92.4 kg 136 mL/hr over 30 Minutes Intravenous Every 48 hours 08/29/21 1627 09/02/21 0836   08/28/21 1800  ceFAZolin (ANCEF) IVPB 2g/100 mL premix  Status:  Discontinued        2 g 200 mL/hr over 30 Minutes Intravenous Every M-W-F (1800) 08/27/21 0846 08/29/21 1620   08/28/21 1200  ceFAZolin (ANCEF) IVPB 1 g/50 mL premix  Status:  Discontinued        1 g 100 mL/hr over 30 Minutes Intravenous Every M-W-F (Hemodialysis) 08/26/21 1954 08/27/21 0846   08/28/21 0600  ceFAZolin (ANCEF) IVPB 2g/100 mL premix        2 g 200 mL/hr over 30 Minutes Intravenous On call to O.R. 08/27/21 1150 08/28/21 0909   08/27/21 1800  ceFAZolin (ANCEF) IVPB 1 g/50 mL premix        1 g 100 mL/hr over 30 Minutes Intravenous  Once 08/26/21 1954 08/27/21 2048   08/26/21 1500  ceFEPIme (MAXIPIME) 1 g in sodium chloride 0.9 % 100 mL IVPB  Status:  Discontinued        1 g 200 mL/hr over 30 Minutes Intravenous Every 24 hours 08/26/21 1452 08/26/21 1714        Subjective: Patient seen and examined at bedside undergoing hemodialysis.  No fever, vomiting, seizures reported.  Slow to respond.  Poor  historian.  Objective: Vitals:   12/18/21 1000 12/18/21 1030 12/18/21 1100 12/18/21 1130  BP: (!) 136/58 115/65 117/62 101/66  Pulse: 66 66 68 68  Resp: (!) 6 10 (!) 7 (!) 9  Temp:      TempSrc:      SpO2: 100% 100% 100% 100%  Weight:      Height:        Intake/Output Summary (Last 24 hours) at 12/18/2021 1159 Last data filed at 12/17/2021 2300 Gross per 24 hour  Intake 1160 ml  Output 0 ml  Net 1160 ml   Filed Weights   12/16/21 1643 12/17/21 2053 12/18/21 0755  Weight: 90 kg 93.4 kg 93.8 kg    Examination:  General exam: Appears calm and comfortable.  On room air.  Slow to respond.  Poor historian. Respiratory system: Bilateral decreased breath sounds at bases with some scattered crackles. Cardiovascular system: S1 & S2 heard, Rate controlled Gastrointestinal system: Abdomen is nondistended, soft and nontender. Normal bowel  sounds heard. Extremities: Mild right lower extremity and thigh tenderness without much erythema.  Left BKA with dressing present   Data Reviewed: I have personally reviewed following labs and imaging studies  CBC: Recent Labs  Lab 12/13/21 0854 12/16/21 0346 12/18/21 0811  WBC 6.7 7.8 7.8  HGB 9.8* 9.6* 9.0*  HCT 29.5* 28.8* 27.2*  MCV 82.9 82.8 84.2  PLT 217 250 643   Basic Metabolic Panel: Recent Labs  Lab 12/13/21 0854 12/16/21 0346 12/18/21 0811  NA 128* 127* 127*  K 4.6 4.8 4.1  CL 87* 86* 87*  CO2 26 25 23   GLUCOSE 170* 127* 162*  BUN 83* 103* 82*  CREATININE 6.20* 6.77* 6.60*  CALCIUM 9.0 9.1 9.4  PHOS 5.6* 6.2* 6.7*   GFR: Estimated Creatinine Clearance: 12.1 mL/min (A) (by C-G formula based on SCr of 6.6 mg/dL (H)). Liver Function Tests: Recent Labs  Lab 12/13/21 0854 12/16/21 0346 12/18/21 0811  ALBUMIN 3.4* 3.6 3.3*   No results for input(s): "LIPASE", "AMYLASE" in the last 168 hours. No results for input(s): "AMMONIA" in the last 168 hours. Coagulation Profile: No results for input(s): "INR", "PROTIME"  in the last 168 hours. Cardiac Enzymes: No results for input(s): "CKTOTAL", "CKMB", "CKMBINDEX", "TROPONINI" in the last 168 hours. BNP (last 3 results) No results for input(s): "PROBNP" in the last 8760 hours. HbA1C: No results for input(s): "HGBA1C" in the last 72 hours. CBG: Recent Labs  Lab 12/17/21 0738 12/17/21 1122 12/17/21 1617 12/17/21 2056 12/18/21 0658  GLUCAP 167* 172* 125* 180* 135*   Lipid Profile: No results for input(s): "CHOL", "HDL", "LDLCALC", "TRIG", "CHOLHDL", "LDLDIRECT" in the last 72 hours. Thyroid Function Tests: No results for input(s): "TSH", "T4TOTAL", "FREET4", "T3FREE", "THYROIDAB" in the last 72 hours. Anemia Panel: No results for input(s): "VITAMINB12", "FOLATE", "FERRITIN", "TIBC", "IRON", "RETICCTPCT" in the last 72 hours. Sepsis Labs: No results for input(s): "PROCALCITON", "LATICACIDVEN" in the last 168 hours.  No results found for this or any previous visit (from the past 240 hour(s)).       Radiology Studies: No results found.      Scheduled Meds:  (feeding supplement) PROSource Plus  30 mL Oral BID BM   alosetron  1 mg Oral BID   amitriptyline  25 mg Oral QHS   atorvastatin  40 mg Oral q1800   carvedilol  6.25 mg Oral BID   cyclobenzaprine  10 mg Oral TID   [START ON 12/20/2021] darbepoetin (ARANESP) injection - DIALYSIS  60 mcg Intravenous Q Fri-HD   docusate sodium  200 mg Oral BID   DULoxetine  60 mg Oral Daily   famotidine  20 mg Oral Daily   fentaNYL  1 patch Transdermal Q72H   furosemide  80 mg Oral Daily   hydrALAZINE  25 mg Oral BID   insulin aspart  0-9 Units Subcutaneous TID WC   insulin aspart  4 Units Subcutaneous TID WC   insulin glargine-yfgn  7 Units Subcutaneous QHS   multivitamin  1 tablet Oral QHS   pantoprazole  80 mg Oral Daily   polyethylene glycol  17 g Oral Daily   sevelamer carbonate  2,400 mg Oral TID WC   sodium zirconium cyclosilicate  10 g Oral Once per day on Sun Tue Thu Sat   Continuous  Infusions:  cefTRIAXone (ROCEPHIN)  IV 2 g (12/17/21 1752)   methocarbamol (ROBAXIN) IV            Aline August, MD [facility] 12/18/2021, 11:59 AM

## 2021-12-18 NOTE — Progress Notes (Signed)
Patient refused CT. Per patient MD is suppose to D/C the order.

## 2021-12-18 NOTE — Progress Notes (Signed)
[facility] Progress Note   Subjective:   seen in HD -  no new c/o's-  still only sitting for 1.5 hours-  big goal dialed in today   Objective Vitals:   12/18/21 0810 12/18/21 0900 12/18/21 0930 12/18/21 1000  BP: (!) 128/50 139/60 (!) 164/60 (!) 136/58  Pulse: 62 65 65 66  Resp: 12 (!) 8 (!) 6 (!) 6  Temp:      TempSrc:      SpO2: 100% 100% 100% 100%  Weight:      Height:       Physical Exam General:WDWn female in NAD Heart:RRR, no mrg Lungs:CTAB, nml WOB on RA Abdomen:soft, NTND, 1+ flank edema Extremities:1+ R calf.thigh edema, trace edema on L stump Dialysis Access: RU AVF +b/t   Filed Weights   12/16/21 1643 12/17/21 2053 12/18/21 0755  Weight: 90 kg 93.4 kg 93.8 kg    Intake/Output Summary (Last 24 hours) at 12/18/2021 1007 Last data filed at 12/17/2021 2300 Gross per 24 hour  Intake 1160 ml  Output 0 ml  Net 1160 ml    Additional Objective Labs: Basic Metabolic Panel: Recent Labs  Lab 12/13/21 0854 12/16/21 0346 12/18/21 0811  NA 128* 127* 127*  K 4.6 4.8 4.1  CL 87* 86* 87*  CO2 26 25 23   GLUCOSE 170* 127* 162*  BUN 83* 103* 82*  CREATININE 6.20* 6.77* 6.60*  CALCIUM 9.0 9.1 9.4  PHOS 5.6* 6.2* 6.7*   Liver Function Tests: Recent Labs  Lab 12/13/21 0854 12/16/21 0346 12/18/21 0811  ALBUMIN 3.4* 3.6 3.3*   CBC: Recent Labs  Lab 12/13/21 0854 12/16/21 0346 12/18/21 0811  WBC 6.7 7.8 7.8  HGB 9.8* 9.6* 9.0*  HCT 29.5* 28.8* 27.2*  MCV 82.9 82.8 84.2  PLT 217 250 242   CBG: Recent Labs  Lab 12/17/21 0738 12/17/21 1122 12/17/21 1617 12/17/21 2056 12/18/21 0658  GLUCAP 167* 172* 125* 180* 135*   Medications:  cefTRIAXone (ROCEPHIN)  IV 2 g (12/17/21 1752)   methocarbamol (ROBAXIN) IV      (feeding supplement) PROSource Plus  30 mL Oral BID BM   alosetron  1 mg Oral BID   amitriptyline  25 mg Oral QHS   atorvastatin  40 mg Oral q1800   carvedilol  6.25 mg Oral BID   cyclobenzaprine  10 mg Oral TID    darbepoetin (ARANESP) injection - DIALYSIS  25 mcg Intravenous Q Fri-HD   docusate sodium  200 mg Oral BID   DULoxetine  60 mg Oral Daily   famotidine  20 mg Oral Daily   fentaNYL  1 patch Transdermal Q72H   furosemide  80 mg Oral Daily   hydrALAZINE  25 mg Oral BID   insulin aspart  0-9 Units Subcutaneous TID WC   insulin aspart  4 Units Subcutaneous TID WC   insulin glargine-yfgn  7 Units Subcutaneous QHS   multivitamin  1 tablet Oral QHS   pantoprazole  80 mg Oral Daily   polyethylene glycol  17 g Oral Daily   sevelamer carbonate  2,400 mg Oral TID WC   sodium zirconium cyclosilicate  10 g Oral Once per day on Sun Tue Thu Sat    Dialysis Orders: MWF at [place], 400/500, EDW 90kg, 2K/2Ca, LUE AVF - heparin 4000 unit IV bolus - No ESA   Problem/Plan:  Recurrent L hip osteomyelitis: S/p 08/10/21 surgery (removal hip IMN which was placed  12/2019) with persistent VRE infection. Back to OR  08/28/21, 08/31/21 for L hip debridement /wound vac  (now removed).  Completed 6 wk course of IV Daptomycin on 10/12/21. Management per primary team.  Chronic L hip/stump pain: She does have fractures of L femoral neck (where hardware was removed) as well as distal femur Fx. Pain control per primary.  R thigh pain - +edema, CT ordered to r/o infection.  ABX started.  ESRD: MWF HD on schedule.  HD today per regular schedule.  Hyperkalemia: Recurrent issues, despite being in the hospital, 2K bath + furosemide. Lokelma 10g added on non-HD days -> now prn. 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 Virl Cagey. AVF working well now. HTN/volume: BP stable on hydralazine 25 mg bid/Coreg.  6.25 mg twice daily (hold a.m. of HD) Attempt taper DOWN  BP meds with UF on HD to prevent cramping low bp on HD.  LE and flank edema noted. Hx large gains. Stressed fluid and sodium restrictions.  Continue max UF as tolerated.  New EDW since admitted in the works. 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   Aranesp 61mcg qwk given 10/13--hgb is dropping-  will inc  Secondary HPTH: CorrCa slightly high, not on VDRA. Phos variable, usually 5-6. Continue Renvela as binder. Nutrition: Alb low, continue protein supplements. DM2: Insulin per primary Dispo: SNF previously recommended, now she would like to go home on discharge even though HD unit has been clear that they cannot assist patient out of her car and into the facility. Patient is aware of this.  Not home HD candidate currently/ dialyzed in recliner on 9/4 but signed off early due to pain, has declined to come in a recliner since then.  strongly discussed on the importance of increasing her mobility as much as possible while she is in her room in addition to following her ongoing fluid restriction. Also reinforced the importance of being in the chair for HD treatments to improve strength and mobility. Our goal is to dialyze her in the mornings so the patient has more time to work with PT in the afternoons (we will do our best to accommodate this as we still need to always consider the emergent cases).  Per Renal Navigator notes there are no clinics that will accept patient unless she will have HD in chair. Will continue to encourage use of chair for HD  [facility] 12/18/2021,10:07 AM  LOS: 114 days

## 2021-12-18 NOTE — Procedures (Signed)
Patient was seen on dialysis and the procedure was supervised.  BFR 400  Via AVF BP is  128/53.   Patient appears to be tolerating treatment well  Louis Meckel 12/18/2021

## 2021-12-18 NOTE — Plan of Care (Signed)
  Problem: Activity: Goal: Risk for activity intolerance will decrease Outcome: Progressing   Problem: Pain Managment: Goal: General experience of comfort will improve Outcome: Progressing   Problem: Education: Goal: Individualized Educational Video(s) Outcome: Progressing   Problem: Fluid Volume: Goal: Compliance with measures to maintain balanced fluid volume will improve Outcome: Progressing   Problem: Health Behavior/Discharge Planning: Goal: Ability to manage health-related needs will improve Outcome: Progressing   Problem: Nutritional: Goal: Ability to make healthy dietary choices will improve Outcome: Progressing   Problem: Clinical Measurements: Goal: Complications related to the disease process, condition or treatment will be avoided or minimized Outcome: Progressing

## 2021-12-19 DIAGNOSIS — N186 End stage renal disease: Secondary | ICD-10-CM | POA: Diagnosis not present

## 2021-12-19 DIAGNOSIS — Z992 Dependence on renal dialysis: Secondary | ICD-10-CM | POA: Diagnosis not present

## 2021-12-19 DIAGNOSIS — M86252 Subacute osteomyelitis, left femur: Secondary | ICD-10-CM | POA: Diagnosis not present

## 2021-12-19 LAB — GLUCOSE, CAPILLARY
Glucose-Capillary: 207 mg/dL — ABNORMAL HIGH (ref 70–99)
Glucose-Capillary: 156 mg/dL — ABNORMAL HIGH (ref 70–99)
Glucose-Capillary: 133 mg/dL — ABNORMAL HIGH (ref 70–99)
Glucose-Capillary: 135 mg/dL — ABNORMAL HIGH (ref 70–99)

## 2021-12-19 MED ORDER — BUPIVACAINE-EPINEPHRINE (PF) 0.25% -1:200000 IJ SOLN
INTRAMUSCULAR | Status: AC
  Filled 2021-12-19: qty 30

## 2021-12-19 MED ORDER — CHLORHEXIDINE GLUCONATE CLOTH 2 % EX PADS
6.0000 | MEDICATED_PAD | Freq: Every day | CUTANEOUS | Status: DC
  Administered 2021-12-19: 6 via TOPICAL

## 2021-12-19 NOTE — Progress Notes (Signed)
Mobility Specialist Progress Note:   12/19/21 1020  Mobility  Activity Transferred from bed to chair  Level of Assistance Minimal assist, patient does 75% or more  Assistive Device Sliding board  Activity Response Tolerated well  Mobility Referral Yes  $Mobility charge 1 Mobility   Pt agreeable to mobility session. Required only minA to place sliding board, minG to scoot. Pt left in chair with all needs met.   Nelta Numbers Acute Rehab Secure Chat or Office Phone: [phone]

## 2021-12-19 NOTE — Progress Notes (Signed)
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  Orthopedic/ID were consulted.  She was started on broad-spectrum antibiotics.  Underwent multiple I&D's by Dr. [name] along with local tissue rearrangement.  She was also seen by vascular surgery who did not fistula branch ligation.  She has completed 6-week course of daptomycin on 10/09/2021.  Discharge is pending ability for patient to tolerate hemodialysis in a chair.  Assessment & Plan:   Subacute osteomyelitis of left femur with abscess -Status post surgical intervention by Dr. [name].  She has completed 6-week course of daptomycin on 10/09/2021.  Outpatient follow-up with Dr. [name] 1 week after discharge -Continue current pain management -Reimaging on 12/02/2021 of left knee showed healing fracture with callus  Right thigh mild cellulitis -Controlled.  Completed 5-day course of antibiotics on 12/18/2021.  Symptoms most likely from anasarca.  History of left intertrochanteric fracture -Left hip hardware removed on 08/10/2021 prior to admission   End-stage renal disease on hemodialysis Anasarca -Nephrology following.  Dialysis as per nephrology schedule.  Still on Lasix.  Strict input and output with daily weights.  Fluid restriction -Has to do hemodialysis in chair prior to discharge to outpatient  hemodialysis  Hyponatremia -Management by hemodialysis  Hyperphosphatemia--continue sevelamer.  Nephrology following.  Anemia of chronic disease -Hemoglobin currently stable.  Monitor intermittently  Diabetes mellitus type 2 with hyperglycemia -Continue long-acting insulin, NovoLog with meals along with CBGs with SSI  Hypertension -Monitor blood pressure.  Continue Coreg, hydralazine.  Off amlodipine  History of unspecified CVA Hyperlipidemia -Continue Lipitor  GERD -Continue Protonix and Pepcid  Tobacco use -Tobacco cessation was discussed by prior hospitalist during this hospitalization  Anxiety and depression -Continue amitriptyline and Cymbalta  Goals of care -Prognosis is guarded to poor.  Remains full code.  Palliative care has already evaluated the patient during this hospitalization   DVT prophylaxis: SCDs Code Status: Full Family Communication: None at bedside Disposition Plan: Status is: Inpatient Remains inpatient appropriate because: Difficult placement due to inability to sit in chair for hemodialysis  Consultants: Orthopedics/nephrology/vascular surgery/ID  Procedures: As above  Antimicrobials:  Anti-infectives (From admission, onward)    Start     Dose/Rate Route Frequency Ordered Stop   12/15/21 1730  cefTRIAXone (ROCEPHIN) 2 g in sodium chloride 0.9 % 100 mL IVPB        2 g 200 mL/hr over 30 Minutes Intravenous Every 24 hours 12/15/21 1633 12/18/21 1750   12/14/21 1800  doxycycline (VIBRA-TABS) tablet 100 mg  Status:  Discontinued        100 mg Oral Every 12 hours 12/14/21 1607 12/15/21 1633   10/02/21 2000  DAPTOmycin (CUBICIN) 900 mg in sodium chloride 0.9 % IVPB  Status:  Discontinued        10 mg/kg  92.4 kg 136 mL/hr over 30 Minutes Intravenous Once per day on Mon Wed Fri 10/01/21 1350 10/12/21 1146   09/30/21 2000  DAPTOmycin (CUBICIN) 900 mg in sodium chloride 0.9 % IVPB  Status:  Discontinued        10 mg/kg  92.4 kg 136 mL/hr over  30 Minutes Intravenous Daily 09/28/21 0919 10/01/21 1350   09/28/21 2000  DAPTOmycin (CUBICIN) 900 mg in sodium chloride 0.9 % IVPB        900 mg 136 mL/hr over 30 Minutes Intravenous  Once 09/28/21 0919 09/28/21 2146   09/02/21 2000  DAPTOmycin (CUBICIN) 900 mg in sodium chloride 0.9 % IVPB  Status:  Discontinued        10 mg/kg  92.4 kg 136 mL/hr over 30 Minutes Intravenous Once per day on Mon Wed Fri 09/02/21 0836 09/28/21 0919   08/31/21 0745  ceFAZolin (ANCEF) IVPB 2g/100 mL premix        2 g 200 mL/hr over 30 Minutes Intravenous To Short Stay 08/31/21 0730 08/31/21 0918   08/30/21 0600  ceFAZolin (ANCEF) IVPB 2g/100 mL premix  Status:  Discontinued        2 g 200 mL/hr over 30 Minutes Intravenous To Short Stay 08/30/21 0123 08/31/21 0600   08/29/21 2000  DAPTOmycin (CUBICIN) 900 mg in sodium chloride 0.9 % IVPB  Status:  Discontinued        10 mg/kg  92.4 kg 136 mL/hr over 30 Minutes Intravenous Every 48 hours 08/29/21 1627 09/02/21 0836   08/28/21 1800  ceFAZolin (ANCEF) IVPB 2g/100 mL premix  Status:  Discontinued        2 g 200 mL/hr over 30 Minutes Intravenous Every M-W-F (1800) 08/27/21 0846 08/29/21 1620   08/28/21 1200  ceFAZolin (ANCEF) IVPB 1 g/50 mL premix  Status:  Discontinued        1 g 100 mL/hr over 30 Minutes Intravenous Every M-W-F (Hemodialysis) 08/26/21 1954 08/27/21 0846   08/28/21 0600  ceFAZolin (ANCEF) IVPB 2g/100 mL premix        2 g 200 mL/hr over 30 Minutes Intravenous On call to O.R. 08/27/21 1150 08/28/21 0909   08/27/21 1800  ceFAZolin (ANCEF) IVPB 1 g/50 mL premix        1 g 100 mL/hr over 30 Minutes Intravenous  Once 08/26/21 1954 08/27/21 2048   08/26/21 1500  ceFEPIme (MAXIPIME) 1 g in sodium chloride 0.9 % 100 mL IVPB  Status:  Discontinued        1 g 200 mL/hr over 30 Minutes Intravenous Every 24 hours 08/26/21 1452 08/26/21 1714        Subjective: Patient seen and examined at bedside.  Poor historian.  No agitation, vomiting, seizures  or fevers reported. Objective: Vitals:   12/18/21 1317 12/18/21 1438 12/18/21 2139 12/19/21 0606  BP: 131/62 (!) 115/52 (!) 112/47 (!) 123/48  Pulse: 67 76 71 63  Resp:   18 17  Temp: 98.1 F (36.7 C) 97.7 F (36.5 C) 98.2 F (36.8 C) 98.1 F (36.7 C)  TempSrc: Oral Oral Oral Oral  SpO2: 99% 95% 98% 98%  Weight:      Height:        Intake/Output Summary (Last 24 hours) at 12/19/2021 0728 Last data filed at 12/19/2021 0200 Gross per 24 hour  Intake 360 ml  Output 4500 ml  Net -4140 ml    Filed Weights   12/17/21 2053 12/18/21 0755 12/18/21 1226  Weight: 93.4 kg 93.8 kg 88.7 kg    Examination:  General exam: On room air currently.  No distress.  Chronically ill and deconditioned.  Slow to respond.  Poor historian. Respiratory system: Decreased breath sounds at bases bilaterally, no wheezing  cardiovascular system: Rate controlled currently, S1-S2 heard gastrointestinal system: Abdomen is distended slightly, soft and nontender.  Bowel sounds are heard  extremities: Right lower extremity and thigh tenderness improving.  Has left BKA with dressing  Data Reviewed: I have personally reviewed following labs and imaging studies  CBC: Recent Labs  Lab 12/13/21 0854 12/16/21 0346 12/18/21 0811  WBC 6.7 7.8 7.8  HGB 9.8* 9.6* 9.0*  HCT 29.5* 28.8* 27.2*  MCV 82.9 82.8 84.2  PLT 217 250 124    Basic Metabolic Panel: Recent Labs  Lab 12/13/21 0854 12/16/21 0346 12/18/21 0811  NA 128* 127* 127*  K 4.6 4.8 4.1  CL 87* 86* 87*  CO2 26 25 23   GLUCOSE 170* 127* 162*  BUN 83* 103* 82*  CREATININE 6.20* 6.77* 6.60*  CALCIUM 9.0 9.1 9.4  PHOS 5.6* 6.2* 6.7*    GFR: Estimated Creatinine Clearance: 11.8 mL/min (A) (by C-G formula based on SCr of 6.6 mg/dL (H)). Liver Function Tests: Recent Labs  Lab 12/13/21 0854 12/16/21 0346 12/18/21 0811  ALBUMIN 3.4* 3.6 3.3*    No results for input(s): "LIPASE", "AMYLASE" in the last 168 hours. No results for input(s):  "AMMONIA" in the last 168 hours. Coagulation Profile: No results for input(s): "INR", "PROTIME" in the last 168 hours. Cardiac Enzymes: No results for input(s): "CKTOTAL", "CKMB", "CKMBINDEX", "TROPONINI" in the last 168 hours. BNP (last 3 results) No results for input(s): "PROBNP" in the last 8760 hours. HbA1C: No results for input(s): "HGBA1C" in the last 72 hours. CBG: Recent Labs  Lab 12/17/21 2056 12/18/21 0658 12/18/21 1317 12/18/21 1711 12/18/21 2139  GLUCAP 180* 135* 160* 152* 167*    Lipid Profile: No results for input(s): "CHOL", "HDL", "LDLCALC", "TRIG", "CHOLHDL", "LDLDIRECT" in the last 72 hours. Thyroid Function Tests: No results for input(s): "TSH", "T4TOTAL", "FREET4", "T3FREE", "THYROIDAB" in the last 72 hours. Anemia Panel: No results for input(s): "VITAMINB12", "FOLATE", "FERRITIN", "TIBC", "IRON", "RETICCTPCT" in the last 72 hours. Sepsis Labs: No results for input(s): "PROCALCITON", "LATICACIDVEN" in the last 168 hours.  No results found for this or any previous visit (from the past 240 hour(s)).       Radiology Studies: No results found.      Scheduled Meds:  (feeding supplement) PROSource Plus  30 mL Oral BID BM   alosetron  1 mg Oral BID   amitriptyline  25 mg Oral QHS   atorvastatin  40 mg Oral q1800   carvedilol  6.25 mg Oral BID   cyclobenzaprine  10 mg Oral TID   [START ON 12/20/2021] darbepoetin (ARANESP) injection - DIALYSIS  60 mcg Intravenous Q Fri-HD   docusate sodium  200 mg Oral BID   DULoxetine  60 mg Oral Daily   famotidine  20 mg Oral Daily   fentaNYL  1 patch Transdermal Q72H   furosemide  80 mg Oral Daily   hydrALAZINE  25 mg Oral BID   insulin aspart  0-9 Units Subcutaneous TID WC   insulin aspart  4 Units Subcutaneous TID WC   insulin glargine-yfgn  7 Units Subcutaneous QHS   multivitamin  1 tablet Oral QHS   pantoprazole  80 mg Oral Daily   polyethylene glycol  17 g Oral Daily   sevelamer carbonate  2,400 mg Oral  TID WC   sodium zirconium cyclosilicate  10 g Oral Once per day on Sun Tue Thu Sat   Continuous Infusions:  methocarbamol (ROBAXIN) IV  Aline August, MD [facility] 12/19/2021, 7:28 AM

## 2021-12-19 NOTE — Progress Notes (Signed)
Physical Therapy Treatment Patient Details Name: [name] MRN: [id number] DOB: December 26, 1965 Today's Date: 12/19/2021   History of Present Illness 56 y.o. female presented to ED 6/26 from dialysis with increased bloody drainage from L hip wound. 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 s/p hardware removal of left hip with partial resection of tuft tissue and femure that were nonviable on 08/10/21 Discharged home 6/23. underwent L hip debridement and wound vac placement 6/28; +Left intertrochanteric non-union,  Fracture of left distal femur  PMH: hypertension, hyperlipidemia, ESRD on HD MWF, history of left BKA in 2021, depression/anxiety, stroke, tobacco use, T2DM,  insomnia, chronic pain syndrome,    PT Comments    Patient up to chair with Mobility Tech earlier today. On arrival had been in chair for 2 hours and felt at most she could sit another 30 minutes. Due to PT schedule, completed stretching exercises and assisted her back to bed with min assist with sliding board with slight incline from chair to bed (~2").  Patient's ROM in left hip and knee appear to be improving and pt reports doing her stretches on her own. Next visit will re-measure to assess if progress towards goal.    Recommendations for follow up therapy are one component of a multi-disciplinary discharge planning process, led by the attending physician.  Recommendations may be updated based on patient status, additional functional criteria and insurance authorization.  Follow Up Recommendations  Skilled nursing-short term rehab (<3 hours/day) (pt currently refusing due to inability to tolerate sitting in chair for 6+ hours as transported to/from dialysis) Can patient physically be transported by private vehicle: No   Assistance Recommended at Discharge Intermittent Supervision/Assistance  Patient can return home with the following A lot of help with bathing/dressing/bathroom;Assistance with  cooking/housework;Assist for transportation;Two people to help with walking and/or transfers;Help with stairs or ramp for entrance   Equipment Recommendations  None recommended by PT    Recommendations for Other Services       Precautions / Restrictions Precautions Precautions: Fall;Other (comment) Precaution Comments: L BKA (baseline) Restrictions Weight Bearing Restrictions: Yes LLE Weight Bearing: Weight bearing as tolerated Other Position/Activity Restrictions: L distal femur fx; per Dr [name] 8/10 pt can full WB     Mobility  Bed Mobility Overal bed mobility: Needs Assistance Bed Mobility: Rolling, Sit to Supine Rolling: Modified independent (Device/Increase time) (to left for left hip internal rotation stretch (held x 30 seconds); rt and lrft for placing pad)     Sit to supine: Modified independent (Device/Increase time)   General bed mobility comments: HOB flat, no rail    Transfers Overall transfer level: Needs assistance   Transfers: Bed to chair/wheelchair/BSC            Lateral/Scoot Transfers: Min assist, With slide board (from lower surface (recliner to bed)) General transfer comment: pt required assist to place sliding board and to anchor board as she initiated slide uphill to bed; no physical assist for moving herself across the board    Ambulation/Gait                   Stairs             Wheelchair Mobility    Modified Rankin (Stroke Patients Only)       Balance Overall balance assessment: Needs assistance Sitting-balance support: Feet supported Sitting balance-Leahy Scale: Fair  Cognition Arousal/Alertness: Awake/alert Behavior During Therapy: WFL for tasks assessed/performed Overall Cognitive Status: Impaired/Different from baseline Area of Impairment: Awareness                         Safety/Judgement: Decreased awareness of deficits, Decreased awareness of  safety Awareness: Emergent   General Comments: continues to push for going home with OP HD despite inability to transfer to/from car        Exercises Other Exercises Other Exercises: left knee contract-relax stretching into extension with hold x 60 seconds x 2 sets Other Exercises: left hip contract-relax into internal rotation with prolonged stretch x 60 seconds x 2 sets Other Exercises: after stretching, bolster placed under left lateral thigh to promote internal rotation    General Comments General comments (skin integrity, edema, etc.): pt has her gray shrinker sock today and donned as it provides two layers of compression and can be pulled up above knee to help with distal thigh      Pertinent Vitals/Pain Pain Assessment Pain Assessment: Faces Faces Pain Scale: Hurts even more Pain Location: left hip primarily Pain Descriptors / Indicators: Discomfort, Grimacing, Guarding Pain Intervention(s): Limited activity within patient's tolerance, Premedicated before session, Repositioned    Home Living                          Prior Function            PT Goals (current goals can now be found in the care plan section) Acute Rehab PT Goals Patient Stated Goal: less pain with movement PT Goal Formulation: With patient Time For Goal Achievement: 12/24/21 Potential to Achieve Goals: Fair Progress towards PT goals: Progressing toward goals    Frequency    Min 2X/week      PT Plan Current plan remains appropriate    Co-evaluation              AM-PAC PT "6 Clicks" Mobility   Outcome Measure  Help needed turning from your back to your side while in a flat bed without using bedrails?: None Help needed moving from lying on your back to sitting on the side of a flat bed without using bedrails?: None Help needed moving to and from a bed to a chair (including a wheelchair)?: A Little Help needed standing up from a chair using your arms (e.g., wheelchair or  bedside chair)?: Total Help needed to walk in hospital room?: Total Help needed climbing 3-5 steps with a railing? : Total 6 Click Score: 14    End of Session Equipment Utilized During Treatment: Gait belt Activity Tolerance: Patient tolerated treatment well Patient left: with call bell/phone within reach;in bed   PT Visit Diagnosis: Other abnormalities of gait and mobility (R26.89);Muscle weakness (generalized) (M62.81);History of falling (Z91.81);Pain Pain - Right/Left: Left Pain - part of body: Leg     Time: 1610-9604 PT Time Calculation (min) (ACUTE ONLY): 35 min  Charges:  $Therapeutic Exercise: 8-22 mins $Therapeutic Activity: 8-22 mins                      Arby Barrette, PT Acute Rehabilitation Services  Office [phone]    [name] 12/19/2021, 1:29 PM

## 2021-12-19 NOTE — Plan of Care (Signed)
  Problem: Activity: Goal: Risk for activity intolerance will decrease Outcome: Progressing   Problem: Pain Managment: Goal: General experience of comfort will improve Outcome: Progressing   Problem: Education: Goal: Individualized Educational Video(s) Outcome: Progressing   Problem: Fluid Volume: Goal: Compliance with measures to maintain balanced fluid volume will improve Outcome: Progressing   Problem: Health Behavior/Discharge Planning: Goal: Ability to manage health-related needs will improve Outcome: Progressing   Problem: Nutritional: Goal: Ability to make healthy dietary choices will improve Outcome: Progressing   Problem: Clinical Measurements: Goal: Complications related to the disease process, condition or treatment will be avoided or minimized Outcome: Progressing

## 2021-12-19 NOTE — Progress Notes (Addendum)
[facility] Progress Note   Subjective:   Patient seen and examined at bedside.  No new complaints.  Getting ready to start working with PT.  She feels like edema in flank is improving.  Denies CP, SOB, abdominal pain, vomiting and diarrhea.   Objective Vitals:   12/18/21 1438 12/18/21 2139 12/19/21 0606 12/19/21 0914  BP: (!) 115/52 (!) 112/47 (!) 123/48 120/61  Pulse: 76 71 63 67  Resp:  18 17 18   Temp: 97.7 F (36.5 C) 98.2 F (36.8 C) 98.1 F (36.7 C) 98 F (36.7 C)  TempSrc: Oral Oral Oral Oral  SpO2: 95% 98% 98% 100%  Weight:      Height:       Physical Exam General:WDWN female in NAD Heart:RRR, no mrg Lungs:CTAB, nml WOB on RA Abdomen:soft, NTND, 1+ flank edema Extremities:trace edema on L BKA, 1+ edema on RLE Dialysis Access: RU AVF +b/t   Filed Weights   12/17/21 2053 12/18/21 0755 12/18/21 1226  Weight: 93.4 kg 93.8 kg 88.7 kg    Intake/Output Summary (Last 24 hours) at 12/19/2021 1015 Last data filed at 12/19/2021 0800 Gross per 24 hour  Intake 820 ml  Output 4500 ml  Net -3680 ml    Additional Objective Labs: Basic Metabolic Panel: Recent Labs  Lab 12/13/21 0854 12/16/21 0346 12/18/21 0811  NA 128* 127* 127*  K 4.6 4.8 4.1  CL 87* 86* 87*  CO2 26 25 23   GLUCOSE 170* 127* 162*  BUN 83* 103* 82*  CREATININE 6.20* 6.77* 6.60*  CALCIUM 9.0 9.1 9.4  PHOS 5.6* 6.2* 6.7*   Liver Function Tests: Recent Labs  Lab 12/13/21 0854 12/16/21 0346 12/18/21 0811  ALBUMIN 3.4* 3.6 3.3*   CBC: Recent Labs  Lab 12/13/21 0854 12/16/21 0346 12/18/21 0811  WBC 6.7 7.8 7.8  HGB 9.8* 9.6* 9.0*  HCT 29.5* 28.8* 27.2*  MCV 82.9 82.8 84.2  PLT 217 250 242   CBG: Recent Labs  Lab 12/18/21 0658 12/18/21 1317 12/18/21 1711 12/18/21 2139 12/19/21 0737  GLUCAP 135* 160* 152* 167* 207*    Medications:  methocarbamol (ROBAXIN) IV      (feeding supplement) PROSource Plus  30 mL Oral BID BM   alosetron  1 mg Oral BID    amitriptyline  25 mg Oral QHS   atorvastatin  40 mg Oral q1800   carvedilol  6.25 mg Oral BID   cyclobenzaprine  10 mg Oral TID   [START ON 12/20/2021] darbepoetin (ARANESP) injection - DIALYSIS  60 mcg Intravenous Q Fri-HD   docusate sodium  200 mg Oral BID   DULoxetine  60 mg Oral Daily   famotidine  20 mg Oral Daily   fentaNYL  1 patch Transdermal Q72H   furosemide  80 mg Oral Daily   hydrALAZINE  25 mg Oral BID   insulin aspart  0-9 Units Subcutaneous TID WC   insulin aspart  4 Units Subcutaneous TID WC   insulin glargine-yfgn  7 Units Subcutaneous QHS   multivitamin  1 tablet Oral QHS   pantoprazole  80 mg Oral Daily   polyethylene glycol  17 g Oral Daily   sevelamer carbonate  2,400 mg Oral TID WC   sodium zirconium cyclosilicate  10 g Oral Once per day on Sun Tue Thu Sat    Dialysis Orders: MWF at [facility] 4hr, 400/500, EDW 90kg, 2K/2Ca, LUE AVF - heparin 4000 unit IV bolus - No ESA   Problem/Plan:  Recurrent L hip osteomyelitis:  S/p 08/10/21 surgery (removal hip IMN which was placed  12/2019) with persistent VRE infection. Back to OR 08/28/21, 08/31/21 for L hip debridement /wound vac  (now removed).  Completed 6 wk course of IV Daptomycin on 10/12/21. Management per primary team.  Chronic L hip/stump pain: She does have fractures of L femoral neck (where hardware was removed) as well as distal femur Fx. Pain control per primary.  R thigh pain - +edema, CT ordered to r/o infection.  ABX started.  ESRD: MWF HD on schedule.  HD tomorrow per regular schedule.  Hyperkalemia: Recurrent issues, despite being in the hospital, 2K bath + furosemide. Lokelma 10g added on non-HD days -> now prn. 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 Virl Cagey. AVF working well now. HTN/volume: BP stable on hydralazine 25 mg bid/Coreg.  6.25 mg twice daily (hold a.m. of HD) Attempt taper DOWN  BP meds with UF on HD to prevent cramping.  LE and flank edema noted. Hx large gains.  Stressed fluid and sodium restrictions.  Continue max UF as tolerated.  New EDW since admitted in the works. 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  Aranesp increased to 60q wk starting 10/20. Secondary HPTH: CorrCa slightly high, not on VDRA. Phos variable, usually 5-6. Continue Renvela as binder. Nutrition: Alb low, continue protein supplements. DM2: Insulin per primary Dispo: SNF previously recommended, now she would like to go home on discharge even though HD unit has been clear that they cannot assist patient out of her car and into the facility. Patient is aware of this.  Not home HD candidate currently/ dialyzed in recliner on 9/4 but signed off early due to pain, has declined to come in a recliner since then.  strongly discussed on the importance of increasing her mobility as much as possible while she is in her room in addition to following her ongoing fluid restriction. Also reinforced the importance of being in the chair for HD treatments to improve strength and mobility. Our goal is to dialyze her in the mornings so the patient has more time to work with PT in the afternoons (we will do our best to accommodate this as we still need to always consider the emergent cases).  Per Renal Navigator notes there are no clinics that will accept patient unless she will have HD in chair. Will continue to encourage use of chair for HD  Jen Mow, PA-C [place] 12/19/2021,10:15 AM  LOS: 115 days

## 2021-12-20 DIAGNOSIS — M86252 Subacute osteomyelitis, left femur: Secondary | ICD-10-CM | POA: Diagnosis not present

## 2021-12-20 LAB — GLUCOSE, CAPILLARY
Glucose-Capillary: 130 mg/dL — ABNORMAL HIGH (ref 70–99)
Glucose-Capillary: 132 mg/dL — ABNORMAL HIGH (ref 70–99)
Glucose-Capillary: 234 mg/dL — ABNORMAL HIGH (ref 70–99)
Glucose-Capillary: 165 mg/dL — ABNORMAL HIGH (ref 70–99)

## 2021-12-20 MED ORDER — OXYCODONE HCL 5 MG PO TABS
ORAL_TABLET | ORAL | Status: AC
  Filled 2021-12-20: qty 1

## 2021-12-20 MED ORDER — HEPARIN SODIUM (PORCINE) 1000 UNIT/ML IJ SOLN
INTRAMUSCULAR | Status: AC
  Administered 2021-12-20: 1000 [IU]
  Filled 2021-12-20: qty 4

## 2021-12-20 MED ORDER — DARBEPOETIN ALFA 60 MCG/0.3ML IJ SOSY
PREFILLED_SYRINGE | INTRAMUSCULAR | Status: AC
  Administered 2021-12-20: 60 ug
  Filled 2021-12-20: qty 0.3

## 2021-12-20 MED ORDER — ALBUMIN HUMAN 25 % IV SOLN
25.0000 g | Freq: Once | INTRAVENOUS | Status: AC
  Administered 2021-12-20: 25 g via INTRAVENOUS

## 2021-12-20 MED ORDER — ALBUMIN HUMAN 25 % IV SOLN
INTRAVENOUS | Status: AC
  Filled 2021-12-20: qty 100

## 2021-12-20 NOTE — Progress Notes (Addendum)
[name] Progress Note   Subjective:   Patient seen and examined at bedside.  Sat in chair for 2.5hrs yesterday.  Encouraged ongoing improvement.  Denies CP, SOB, abdominal pain and v/d.    Objective Vitals:   12/19/21 1548 12/19/21 2124 12/20/21 0500 12/20/21 0748  BP: (!) 134/52 137/67 138/61 (!) 142/59  Pulse: 61 64 63 66  Resp: 18 18 17 16   Temp: 98.2 F (36.8 C) 98.7 F (37.1 C) 98 F (36.7 C) 97.7 F (36.5 C)  TempSrc: Oral Oral Oral Oral  SpO2: 97% 99% 100% 100%  Weight:      Height:       Physical Exam General:WDWN female in NAD Heart:RRR, no mrg Lungs:CTAB, nml WOB on RA Abdomen:soft, NTND, +flank edema Extremities:L BKA, trace to 1+ edema b/l Dialysis Access: RU AVF +b/t   Filed Weights   12/17/21 2053 12/18/21 0755 12/18/21 1226  Weight: 93.4 kg 93.8 kg 88.7 kg    Intake/Output Summary (Last 24 hours) at 12/20/2021 0757 Last data filed at 12/19/2021 1700 Gross per 24 hour  Intake 760 ml  Output 150 ml  Net 610 ml    Additional Objective Labs: Basic Metabolic Panel: Recent Labs  Lab 12/16/21 0346 12/18/21 0811  NA 127* 127*  K 4.8 4.1  CL 86* 87*  CO2 25 23  GLUCOSE 127* 162*  BUN 103* 82*  CREATININE 6.77* 6.60*  CALCIUM 9.1 9.4  PHOS 6.2* 6.7*   Liver Function Tests: Recent Labs  Lab 12/13/21 0854 12/16/21 0346 12/18/21 0811  ALBUMIN 3.4* 3.6 3.3*   CBC: Recent Labs  Lab 12/16/21 0346 12/18/21 0811  WBC 7.8 7.8  HGB 9.6* 9.0*  HCT 28.8* 27.2*  MCV 82.8 84.2  PLT 250 242    CBG: Recent Labs  Lab 12/19/21 0737 12/19/21 1109 12/19/21 1616 12/19/21 2132 12/20/21 0745  GLUCAP 207* 156* 133* 135* 130*    Medications:  methocarbamol (ROBAXIN) IV      (feeding supplement) PROSource Plus  30 mL Oral BID BM   alosetron  1 mg Oral BID   amitriptyline  25 mg Oral QHS   atorvastatin  40 mg Oral q1800   carvedilol  6.25 mg Oral BID   Chlorhexidine Gluconate Cloth  6 each Topical Q0600    cyclobenzaprine  10 mg Oral TID   darbepoetin (ARANESP) injection - DIALYSIS  60 mcg Intravenous Q Fri-HD   docusate sodium  200 mg Oral BID   DULoxetine  60 mg Oral Daily   famotidine  20 mg Oral Daily   fentaNYL  1 patch Transdermal Q72H   furosemide  80 mg Oral Daily   hydrALAZINE  25 mg Oral BID   insulin aspart  0-9 Units Subcutaneous TID WC   insulin aspart  4 Units Subcutaneous TID WC   insulin glargine-yfgn  7 Units Subcutaneous QHS   multivitamin  1 tablet Oral QHS   pantoprazole  80 mg Oral Daily   polyethylene glycol  17 g Oral Daily   sevelamer carbonate  2,400 mg Oral TID WC   sodium zirconium cyclosilicate  10 g Oral Once per day on Sun Tue Thu Sat    Dialysis Orders: MWF at [facility], 400/500, EDW 90kg, 2K/2Ca, LUE AVF - heparin 4000 unit IV bolus - No ESA   Problem/Plan:  Recurrent L hip osteomyelitis: S/p 08/10/21 surgery (removal hip IMN which was placed  12/2019) with persistent VRE infection. Back to OR 08/28/21, 08/31/21 for L hip debridement /  wound vac  (now removed).  Completed 6 wk course of IV Daptomycin on 10/12/21. Management per primary team.  Chronic L hip/stump pain: She does have fractures of L femoral neck (where hardware was removed) as well as distal femur Fx. Pain control per primary.  R thigh pain - +edema, CT ordered to r/o infection - not completed.  ABX started.  ESRD: MWF HD on schedule.  HD today per regular schedule.  Hyperkalemia: Recurrent issues, despite being in the hospital, 2K bath + furosemide. Lokelma 10g added on non-HD days -> now prn. 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 Virl Cagey. AVF working well now. HTN/volume: BP stable on hydralazine 25 mg bid/Coreg.  6.25 mg twice daily (hold a.m. of HD) Attempt taper DOWN  BP meds with UF on HD to prevent cramping.  LE and flank edema noted. Hx large gains. Stressed fluid and sodium restrictions.  Continue max UF as tolerated.  New EDW since admitted in the  works. 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  Aranesp increased to 60q wk starting 10/20. Secondary HPTH: CorrCa slightly high, not on VDRA. Phos variable, usually 5-6. Continue Renvela as binder. Nutrition: Alb low, continue protein supplements. DM2: Insulin per primary Dispo: SNF previously recommended, now she would like to go home on discharge even though HD unit has been clear that they cannot assist patient out of her car and into the facility. Patient is aware of this.  Not home HD candidate currently/ dialyzed in recliner on 9/4 but signed off early due to pain, has declined to come in a recliner since then.Strongly discussed on the importance of increasing her mobility as much as possible while she is in her room in addition to following her ongoing fluid restriction. Also reinforced the importance of being in the chair for HD treatments to improve strength and mobility. Our goal is to dialyze her in the mornings so the patient has more time to work with PT in the afternoons (we will do our best to accommodate this as we still need to always consider the emergent cases).  Per Renal Navigator notes there are no clinics that will accept patient unless she will have HD in chair. Will continue to encourage use of chair for HD  Jen Mow, PA-C [facility] 12/20/2021,7:57 AM  LOS: 116 days   Patient seen and examined, agree with above note with above modifications. No new c/o's-  seems to be making slow progress with PT-  cont with needing pretty max UF with HD Corliss Parish, MD 12/20/2021

## 2021-12-20 NOTE — Procedures (Signed)
Patient was seen on dialysis and the procedure was supervised.  BFR 350  Via AVF BP is  126/80.   Patient appears to be tolerating treatment well  Louis Meckel 12/20/2021

## 2021-12-20 NOTE — TOC Progression Note (Signed)
Transition of Care [facility]) - Initial/Assessment Note    Patient Details  Name: [name] MRN: [id number] Date of Birth: Nov 21, 1965  Transition of [facility]) CM/SW Contact:    Milinda Antis, [place] Phone Number: 12/20/2021, 2:07 PM  Clinical Narrative:                 Patient still reports being unable to sit for dialysis treatment.  This is a barrier to any discharge location as outpatient dialysis require that patients sit in a chair to receive dialysis.   TOC will continue to follow.    Expected Discharge Plan: Skilled Nursing Facility Barriers to Discharge: Continued Medical Work up   Patient Goals and CMS Choice Patient states their goals for this hospitalization and ongoing recovery are:: To return home CMS Medicare.gov Compare Post Acute Care list provided to:: Patient Choice offered to / list presented to : NA  Expected Discharge Plan and Services Expected Discharge Plan: [place]   Discharge Planning Services: CM Consult   Living arrangements for the past 2 months: Single Family Home                                      Prior Living Arrangements/Services Living arrangements for the past 2 months: Single Family Home Lives with:: Adult Children Patient language and need for interpreter reviewed:: Yes Do you feel safe going back to the place where you live?: Yes      Need for Family Participation in Patient Care: Yes (Comment) Care giver support system in place?: Yes (comment) Current home services: DME ([employer], w/c) Criminal Activity/Legal Involvement Pertinent to Current Situation/Hospitalization: No - Comment as needed  Activities of Daily Living Home Assistive Devices/Equipment: Eyeglasses, Wheelchair ADL Screening (condition at time of admission) Patient's cognitive ability adequate to safely complete daily activities?: Yes Is the patient deaf or have difficulty hearing?: No Does the patient have difficulty seeing, even when  wearing glasses/contacts?: No Does the patient have difficulty concentrating, remembering, or making decisions?: No Patient able to express need for assistance with ADLs?: Yes Does the patient have difficulty dressing or bathing?: Yes Independently performs ADLs?: No Communication: Independent Dressing (OT): Independent Is this a change from baseline?: Pre-admission baseline Grooming: Independent Feeding: Independent Bathing: Needs assistance Is this a change from baseline?: Pre-admission baseline Toileting: Needs assistance Is this a change from baseline?: Pre-admission baseline In/Out Bed: Needs assistance, Dependent Is this a change from baseline?: Pre-admission baseline Walks in Home: Dependent Is this a change from baseline?: Pre-admission baseline Does the patient have difficulty walking or climbing stairs?: Yes Weakness of Legs: Both Weakness of Arms/Hands: Both  Permission Sought/Granted Permission sought to share information with : Case Manager, Family Supports Permission granted to share information with : Yes, Verbal Permission Granted              Emotional Assessment Appearance:: Appears stated age Attitude/Demeanor/Rapport: Engaged, Gracious Affect (typically observed): Accepting, Appropriate, Calm, Hopeful Orientation: : Oriented to Self, Oriented to Place, Oriented to  Time, Oriented to Situation Alcohol / Substance Use: Not Applicable Psych Involvement: No (comment)  Admission diagnosis:  Wound infection [T14.8XXA, L08.9] Abscess of left hip [L02.416] Patient Active Problem List   Diagnosis Date Noted   Wound dehiscence, surgical, sequela    Abscess of left hip 08/26/2021   History of CVA (cerebrovascular accident) 19/62/2297   Hardware complicating wound infection ([place])    Subacute  osteomyelitis of left femur with abscess     Septic arthritis of hip ([place]) 07/30/2021   Skin abscess L hip 07/28/2021   HCAP (healthcare-associated pneumonia) 07/27/2021    Hyperkalemia 74/16/3845   Metabolic acidemia 36/46/8032   Closed fracture of left distal femur ([facility]) 07/22/2021   Type 2 diabetes mellitus with hyperlipidemia ([place]) 07/22/2021   Infection of superficial incisional surgical site after procedure 03/09/2020   Abscess    Hypoglycemia    Essential hypertension    Sleep disturbance    ESRD (end stage renal disease) on dialysis ([place]) 01/24/2020   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[place]) 01/23/2020   Hyponatremia    Constipation    Chronic osteomyelitis involving left ankle and foot ([place])    Ulcer of left foot with necrosis of bone ([place])    Wound infection 12/28/2019   History of [place] amputation of left foot ([place]) 12/25/2017   History of CVA (cerebrovascular accident) without residual deficits 07/04/2017   Cerebral thrombosis with cerebral infarction 04/08/2017   Right sided weakness 04/07/2017   Hyperhidrosis 09/01/2016   Migraine with aura and without status migrainosus, not intractable 07/28/2016   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   CKD stage 3 due to type 2 diabetes mellitus ([place]) 06/27/2014   Vitamin D insufficiency 05/08/2014   Obesity (BMI 30.0-34.9) 05/08/2014   Unilateral amputation of left foot ([place]) 05/08/2014   Bursitis of left shoulder 02/14/2014   Neck pain 01/03/2014   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.23) 01/14/2013   Insomnia 08/04/2012   Diabetic neuropathy, painful ([place]) 08/04/2011   Anxiety and depression 05/16/2010   Female stress incontinence 11/01/2007   TOBACCO DEPENDENCE 04/30/2006   GASTROESOPHAGEAL REFLUX, NO ESOPHAGITIS 04/30/2006   Irritable bowel syndrome 04/30/2006   PCP:  [name], MD Pharmacy:   [facility] [place] [address] [place] [address] Phone: [phone] Fax: [phone]  [id number] - [place], [place] [address] [place] [address] Phone: [phone] Fax: [phone]     Social Determinants of Health (SDOH) Interventions    Readmission Risk Interventions    07/26/2021    3:15 PM  Readmission Risk Prevention Plan  Transportation Screening Complete  PCP or Specialist Appt within 3-5 Days Complete  HRI or Home Care Consult Complete  Palliative Care Screening Not Applicable  Medication Review (RN Care Manager) Referral to Pharmacy

## 2021-12-20 NOTE — Progress Notes (Signed)
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  Orthopedic/ID were consulted.  She was started on broad-spectrum antibiotics.  Underwent multiple I&D's by Dr. [name] along with local tissue rearrangement.  She was also seen by vascular surgery who did not fistula branch ligation.  She has completed 6-week course of daptomycin on 10/09/2021.  Discharge is pending ability for patient to tolerate hemodialysis in a chair.  Assessment & Plan:   Subacute osteomyelitis of left femur with abscess -Status post surgical intervention by Dr. [name].  She has completed 6-week course of daptomycin on 10/09/2021.  Outpatient follow-up with Dr. [name] 1 week after discharge -Continue current pain management -Reimaging on 12/02/2021 of left knee showed healing fracture with callus  Right thigh mild cellulitis -Controlled.  Completed 5-day course of antibiotics on 12/18/2021.  Symptoms most likely from anasarca.  History of left intertrochanteric fracture -Left hip hardware removed on 08/10/2021 prior to admission   End-stage renal disease on hemodialysis Anasarca -Nephrology following.  Dialysis as per nephrology schedule.  Still on Lasix.  Strict input and output with daily weights.  Fluid restriction -Has to do hemodialysis in chair prior to discharge to outpatient  hemodialysis  Hyponatremia -Management by hemodialysis  Hyperphosphatemia--continue sevelamer.  Nephrology following.  Anemia of chronic disease -Hemoglobin currently stable.  Monitor intermittently  Diabetes mellitus type 2 with hyperglycemia -Continue long-acting insulin, NovoLog with meals along with CBGs with SSI  Hypertension -Monitor blood pressure.  Continue Coreg, hydralazine.  Off amlodipine  History of unspecified CVA Hyperlipidemia -Continue Lipitor  GERD -Continue Protonix and Pepcid  Tobacco use -Tobacco cessation was discussed by prior hospitalist during this hospitalization  Anxiety and depression -Continue amitriptyline and Cymbalta  Goals of care -Prognosis is guarded to poor.  Remains full code.  Palliative care has already evaluated the patient during this hospitalization   DVT prophylaxis: SCDs Code Status: Full Family Communication: None at bedside Disposition Plan: Status is: Inpatient Remains inpatient appropriate because: Difficult placement due to inability to sit in chair for hemodialysis  Consultants: Orthopedics/nephrology/vascular surgery/ID  Procedures: As above  Antimicrobials:  Anti-infectives (From admission, onward)    Start     Dose/Rate Route Frequency Ordered Stop   12/15/21 1730  cefTRIAXone (ROCEPHIN) 2 g in sodium chloride 0.9 % 100 mL IVPB        2 g 200 mL/hr over 30 Minutes Intravenous Every 24 hours 12/15/21 1633 12/18/21 1750   12/14/21 1800  doxycycline (VIBRA-TABS) tablet 100 mg  Status:  Discontinued        100 mg Oral Every 12 hours 12/14/21 1607 12/15/21 1633   10/02/21 2000  DAPTOmycin (CUBICIN) 900 mg in sodium chloride 0.9 % IVPB  Status:  Discontinued        10 mg/kg  92.4 kg 136 mL/hr over 30 Minutes Intravenous Once per day on Mon Wed Fri 10/01/21 1350 10/12/21 1146   09/30/21 2000  DAPTOmycin (CUBICIN) 900 mg in sodium chloride 0.9 % IVPB  Status:  Discontinued        10 mg/kg  92.4 kg 136 mL/hr over  30 Minutes Intravenous Daily 09/28/21 0919 10/01/21 1350   09/28/21 2000  DAPTOmycin (CUBICIN) 900 mg in sodium chloride 0.9 % IVPB        900 mg 136 mL/hr over 30 Minutes Intravenous  Once 09/28/21 0919 09/28/21 2146   09/02/21 2000  DAPTOmycin (CUBICIN) 900 mg in sodium chloride 0.9 % IVPB  Status:  Discontinued        10 mg/kg  92.4 kg 136 mL/hr over 30 Minutes Intravenous Once per day on Mon Wed Fri 09/02/21 0836 09/28/21 0919   08/31/21 0745  ceFAZolin (ANCEF) IVPB 2g/100 mL premix        2 g 200 mL/hr over 30 Minutes Intravenous To Short Stay 08/31/21 0730 08/31/21 0918   08/30/21 0600  ceFAZolin (ANCEF) IVPB 2g/100 mL premix  Status:  Discontinued        2 g 200 mL/hr over 30 Minutes Intravenous To Short Stay 08/30/21 0123 08/31/21 0600   08/29/21 2000  DAPTOmycin (CUBICIN) 900 mg in sodium chloride 0.9 % IVPB  Status:  Discontinued        10 mg/kg  92.4 kg 136 mL/hr over 30 Minutes Intravenous Every 48 hours 08/29/21 1627 09/02/21 0836   08/28/21 1800  ceFAZolin (ANCEF) IVPB 2g/100 mL premix  Status:  Discontinued        2 g 200 mL/hr over 30 Minutes Intravenous Every M-W-F (1800) 08/27/21 0846 08/29/21 1620   08/28/21 1200  ceFAZolin (ANCEF) IVPB 1 g/50 mL premix  Status:  Discontinued        1 g 100 mL/hr over 30 Minutes Intravenous Every M-W-F (Hemodialysis) 08/26/21 1954 08/27/21 0846   08/28/21 0600  ceFAZolin (ANCEF) IVPB 2g/100 mL premix        2 g 200 mL/hr over 30 Minutes Intravenous On call to O.R. 08/27/21 1150 08/28/21 0909   08/27/21 1800  ceFAZolin (ANCEF) IVPB 1 g/50 mL premix        1 g 100 mL/hr over 30 Minutes Intravenous  Once 08/26/21 1954 08/27/21 2048   08/26/21 1500  ceFEPIme (MAXIPIME) 1 g in sodium chloride 0.9 % 100 mL IVPB  Status:  Discontinued        1 g 200 mL/hr over 30 Minutes Intravenous Every 24 hours 08/26/21 1452 08/26/21 1714        Subjective: Patient seen and examined at bedside undergoing dialysis.  Poor historian.  No fever,  chest pain, worsening shortness of breath or vomiting reported.   Objective: Vitals:   12/19/21 0914 12/19/21 1548 12/19/21 2124 12/20/21 0500  BP: 120/61 (!) 134/52 137/67 138/61  Pulse: 67 61 64 63  Resp: 18 18 18 17   Temp: 98 F (36.7 C) 98.2 F (36.8 C) 98.7 F (37.1 C) 98 F (36.7 C)  TempSrc: Oral Oral Oral Oral  SpO2: 100% 97% 99% 100%  Weight:      Height:        Intake/Output Summary (Last 24 hours) at 12/20/2021 0724 Last data filed at 12/19/2021 1700 Gross per 24 hour  Intake 760 ml  Output 150 ml  Net 610 ml    Filed Weights   12/17/21 2053 12/18/21 0755 12/18/21 1226  Weight: 93.4 kg 93.8 kg 88.7 kg    Examination:  General exam: No acute distress.  Still on room air.  Chronically ill and deconditioned.  Slow to respond.  Poor historian. Respiratory system: Bilateral decreased  breath sounds at bases with some scattered crackles  cardiovascular system: S1 and S2 are heard; mostly rate controlled gastrointestinal system: Abdomen is mildly distended; soft and nontender.  Bowel sounds are heard  extremities: Has left BKA with dressing  Data Reviewed: I have personally reviewed following labs and imaging studies  CBC: Recent Labs  Lab 12/13/21 0854 12/16/21 0346 12/18/21 0811  WBC 6.7 7.8 7.8  HGB 9.8* 9.6* 9.0*  HCT 29.5* 28.8* 27.2*  MCV 82.9 82.8 84.2  PLT 217 250 035    Basic Metabolic Panel: Recent Labs  Lab 12/13/21 0854 12/16/21 0346 12/18/21 0811  NA 128* 127* 127*  K 4.6 4.8 4.1  CL 87* 86* 87*  CO2 26 25 23   GLUCOSE 170* 127* 162*  BUN 83* 103* 82*  CREATININE 6.20* 6.77* 6.60*  CALCIUM 9.0 9.1 9.4  PHOS 5.6* 6.2* 6.7*    GFR: Estimated Creatinine Clearance: 11.8 mL/min (A) (by C-G formula based on SCr of 6.6 mg/dL (H)). Liver Function Tests: Recent Labs  Lab 12/13/21 0854 12/16/21 0346 12/18/21 0811  ALBUMIN 3.4* 3.6 3.3*    No results for input(s): "LIPASE", "AMYLASE" in the last 168 hours. No results for input(s):  "AMMONIA" in the last 168 hours. Coagulation Profile: No results for input(s): "INR", "PROTIME" in the last 168 hours. Cardiac Enzymes: No results for input(s): "CKTOTAL", "CKMB", "CKMBINDEX", "TROPONINI" in the last 168 hours. BNP (last 3 results) No results for input(s): "PROBNP" in the last 8760 hours. HbA1C: No results for input(s): "HGBA1C" in the last 72 hours. CBG: Recent Labs  Lab 12/18/21 2139 12/19/21 0737 12/19/21 1109 12/19/21 1616 12/19/21 2132  GLUCAP 167* 207* 156* 133* 135*    Lipid Profile: No results for input(s): "CHOL", "HDL", "LDLCALC", "TRIG", "CHOLHDL", "LDLDIRECT" in the last 72 hours. Thyroid Function Tests: No results for input(s): "TSH", "T4TOTAL", "FREET4", "T3FREE", "THYROIDAB" in the last 72 hours. Anemia Panel: No results for input(s): "VITAMINB12", "FOLATE", "FERRITIN", "TIBC", "IRON", "RETICCTPCT" in the last 72 hours. Sepsis Labs: No results for input(s): "PROCALCITON", "LATICACIDVEN" in the last 168 hours.  No results found for this or any previous visit (from the past 240 hour(s)).       Radiology Studies: No results found.      Scheduled Meds:  (feeding supplement) PROSource Plus  30 mL Oral BID BM   alosetron  1 mg Oral BID   amitriptyline  25 mg Oral QHS   atorvastatin  40 mg Oral q1800   carvedilol  6.25 mg Oral BID   Chlorhexidine Gluconate Cloth  6 each Topical Q0600   cyclobenzaprine  10 mg Oral TID   darbepoetin (ARANESP) injection - DIALYSIS  60 mcg Intravenous Q Fri-HD   docusate sodium  200 mg Oral BID   DULoxetine  60 mg Oral Daily   famotidine  20 mg Oral Daily   fentaNYL  1 patch Transdermal Q72H   furosemide  80 mg Oral Daily   hydrALAZINE  25 mg Oral BID   insulin aspart  0-9 Units Subcutaneous TID WC   insulin aspart  4 Units Subcutaneous TID WC   insulin glargine-yfgn  7 Units Subcutaneous QHS   multivitamin  1 tablet Oral QHS   pantoprazole  80 mg Oral Daily   polyethylene glycol  17 g Oral Daily    sevelamer carbonate  2,400 mg Oral TID WC   sodium zirconium cyclosilicate  10 g Oral Once per day on Sun Tue Thu Sat   Continuous Infusions:  methocarbamol (ROBAXIN)  IV            Aline August, MD [facility] 12/20/2021, 7:24 AM

## 2021-12-21 DIAGNOSIS — M86252 Subacute osteomyelitis, left femur: Secondary | ICD-10-CM | POA: Diagnosis not present

## 2021-12-21 LAB — GLUCOSE, CAPILLARY
Glucose-Capillary: 164 mg/dL — ABNORMAL HIGH (ref 70–99)
Glucose-Capillary: 224 mg/dL — ABNORMAL HIGH (ref 70–99)
Glucose-Capillary: 172 mg/dL — ABNORMAL HIGH (ref 70–99)
Glucose-Capillary: 166 mg/dL — ABNORMAL HIGH (ref 70–99)

## 2021-12-21 NOTE — Progress Notes (Signed)
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  Orthopedic/ID were consulted.  She was started on broad-spectrum antibiotics.  Underwent multiple I&D's by Dr. [name] along with local tissue rearrangement.  She was also seen by vascular surgery who did not fistula branch ligation.  She has completed 6-week course of daptomycin on 10/09/2021.  Discharge is pending ability for patient to tolerate hemodialysis in a chair.  Assessment & Plan:   Subacute osteomyelitis of left femur with abscess -Status post surgical intervention by Dr. [name].  She has completed 6-week course of daptomycin on 10/09/2021.  Outpatient follow-up with Dr. [name] 1 week after discharge -Continue current pain management -Reimaging on 12/02/2021 of left knee showed healing fracture with callus  Right thigh mild cellulitis -Controlled.  Completed 5-day course of antibiotics on 12/18/2021.  Symptoms most likely from anasarca.  History of left intertrochanteric fracture -Left hip hardware removed on 08/10/2021 prior to admission   End-stage renal disease on hemodialysis Anasarca -Nephrology following.  Dialysis as per nephrology schedule.  Still on Lasix.  Strict input and output with daily weights.  Fluid restriction -Has to do hemodialysis in chair prior to discharge to outpatient  hemodialysis  Hyponatremia -Management by hemodialysis.  Monitor intermittently.  Hyperphosphatemia--continue sevelamer.  Nephrology following.  Monitor intermittently.  Anemia of chronic disease -Hemoglobin currently stable.  Monitor intermittently  Diabetes mellitus type 2 with hyperglycemia -Continue long-acting insulin, NovoLog with meals along with CBGs with SSI  Hypertension -Monitor blood pressure.  Continue Coreg, hydralazine.  Off amlodipine  History of unspecified CVA Hyperlipidemia -Continue Lipitor  GERD -Continue Protonix and Pepcid  Tobacco use -Tobacco cessation was discussed by prior hospitalist during this hospitalization  Anxiety and depression -Continue amitriptyline and Cymbalta  Goals of care -Prognosis is guarded to poor.  Remains full code.  Palliative care has already evaluated the patient during this hospitalization   DVT prophylaxis: SCDs Code Status: Full Family Communication: None at bedside Disposition Plan: Status is: Inpatient Remains inpatient appropriate because: Difficult placement due to inability to sit in chair for hemodialysis  Consultants: Orthopedics/nephrology/vascular surgery/ID  Procedures: As above  Antimicrobials:  Anti-infectives (From admission, onward)    Start     Dose/Rate Route Frequency Ordered Stop   12/15/21 1730  cefTRIAXone (ROCEPHIN) 2 g in sodium chloride 0.9 % 100 mL IVPB        2 g 200 mL/hr over 30 Minutes Intravenous Every 24 hours 12/15/21 1633 12/18/21 1750   12/14/21 1800  doxycycline (VIBRA-TABS) tablet 100 mg  Status:  Discontinued        100 mg Oral Every 12 hours 12/14/21 1607 12/15/21 1633   10/02/21 2000  DAPTOmycin (CUBICIN) 900 mg in sodium chloride 0.9 % IVPB  Status:  Discontinued        10 mg/kg  92.4 kg 136 mL/hr over 30 Minutes Intravenous Once per day on Mon Wed Fri 10/01/21 1350 10/12/21 1146   09/30/21 2000  DAPTOmycin (CUBICIN) 900 mg in sodium  chloride 0.9 % IVPB  Status:   Discontinued        10 mg/kg  92.4 kg 136 mL/hr over 30 Minutes Intravenous Daily 09/28/21 0919 10/01/21 1350   09/28/21 2000  DAPTOmycin (CUBICIN) 900 mg in sodium chloride 0.9 % IVPB        900 mg 136 mL/hr over 30 Minutes Intravenous  Once 09/28/21 0919 09/28/21 2146   09/02/21 2000  DAPTOmycin (CUBICIN) 900 mg in sodium chloride 0.9 % IVPB  Status:  Discontinued        10 mg/kg  92.4 kg 136 mL/hr over 30 Minutes Intravenous Once per day on Mon Wed Fri 09/02/21 0836 09/28/21 0919   08/31/21 0745  ceFAZolin (ANCEF) IVPB 2g/100 mL premix        2 g 200 mL/hr over 30 Minutes Intravenous To Short Stay 08/31/21 0730 08/31/21 0918   08/30/21 0600  ceFAZolin (ANCEF) IVPB 2g/100 mL premix  Status:  Discontinued        2 g 200 mL/hr over 30 Minutes Intravenous To Short Stay 08/30/21 0123 08/31/21 0600   08/29/21 2000  DAPTOmycin (CUBICIN) 900 mg in sodium chloride 0.9 % IVPB  Status:  Discontinued        10 mg/kg  92.4 kg 136 mL/hr over 30 Minutes Intravenous Every 48 hours 08/29/21 1627 09/02/21 0836   08/28/21 1800  ceFAZolin (ANCEF) IVPB 2g/100 mL premix  Status:  Discontinued        2 g 200 mL/hr over 30 Minutes Intravenous Every M-W-F (1800) 08/27/21 0846 08/29/21 1620   08/28/21 1200  ceFAZolin (ANCEF) IVPB 1 g/50 mL premix  Status:  Discontinued        1 g 100 mL/hr over 30 Minutes Intravenous Every M-W-F (Hemodialysis) 08/26/21 1954 08/27/21 0846   08/28/21 0600  ceFAZolin (ANCEF) IVPB 2g/100 mL premix        2 g 200 mL/hr over 30 Minutes Intravenous On call to O.R. 08/27/21 1150 08/28/21 0909   08/27/21 1800  ceFAZolin (ANCEF) IVPB 1 g/50 mL premix        1 g 100 mL/hr over 30 Minutes Intravenous  Once 08/26/21 1954 08/27/21 2048   08/26/21 1500  ceFEPIme (MAXIPIME) 1 g in sodium chloride 0.9 % 100 mL IVPB  Status:  Discontinued        1 g 200 mL/hr over 30 Minutes Intravenous Every 24 hours 08/26/21 1452 08/26/21 1714        Subjective: Patient seen and examined at  bedside.  Poor historian.  No agitation, fever, vomiting or seizures reported.   Objective: Vitals:   12/20/21 1355 12/20/21 1644 12/20/21 2044 12/21/21 0619  BP: (!) 129/56 115/67 129/75 128/61  Pulse: 66 65 66 66  Resp: 14 18 18 18   Temp: 98.2 F (36.8 C) 98.6 F (37 C) 98.9 F (37.2 C) 98.1 F (36.7 C)  TempSrc: Oral Oral Oral Oral  SpO2: 99% 96% 99% 100%  Weight:      Height:        Intake/Output Summary (Last 24 hours) at 12/21/2021 0752 Last data filed at 12/20/2021 2356 Gross per 24 hour  Intake 170 ml  Output 154 ml  Net 16 ml    Filed Weights   12/18/21 1226 12/20/21 0921 12/20/21 1315  Weight: 88.7 kg 93.7 kg 88.2 kg    Examination:  General exam: On room air currently.  No distress.  Chronically ill and deconditioned.  Still slow to respond.  Poor historian. Respiratory system: Decreased  breath sounds at bases bilaterally with scattered crackles  cardiovascular system: Rate mostly controlled; S1-S2 heard gastrointestinal system: Abdomen still has some distention; soft and nontender.  Normal bowel sounds are heard  extremities: Has left BKA with dressing  Data Reviewed: I have personally reviewed following labs and imaging studies  CBC: Recent Labs  Lab 12/16/21 0346 12/18/21 0811  WBC 7.8 7.8  HGB 9.6* 9.0*  HCT 28.8* 27.2*  MCV 82.8 84.2  PLT 250 093    Basic Metabolic Panel: Recent Labs  Lab 12/16/21 0346 12/18/21 0811  NA 127* 127*  K 4.8 4.1  CL 86* 87*  CO2 25 23  GLUCOSE 127* 162*  BUN 103* 82*  CREATININE 6.77* 6.60*  CALCIUM 9.1 9.4  PHOS 6.2* 6.7*    GFR: Estimated Creatinine Clearance: 11.8 mL/min (A) (by C-G formula based on SCr of 6.6 mg/dL (H)). Liver Function Tests: Recent Labs  Lab 12/16/21 0346 12/18/21 0811  ALBUMIN 3.6 3.3*    No results for input(s): "LIPASE", "AMYLASE" in the last 168 hours. No results for input(s): "AMMONIA" in the last 168 hours. Coagulation Profile: No results for input(s): "INR",  "PROTIME" in the last 168 hours. Cardiac Enzymes: No results for input(s): "CKTOTAL", "CKMB", "CKMBINDEX", "TROPONINI" in the last 168 hours. BNP (last 3 results) No results for input(s): "PROBNP" in the last 8760 hours. HbA1C: No results for input(s): "HGBA1C" in the last 72 hours. CBG: Recent Labs  Lab 12/20/21 0745 12/20/21 1352 12/20/21 1643 12/20/21 2043 12/21/21 0727  GLUCAP 130* 132* 234* 165* 164*    Lipid Profile: No results for input(s): "CHOL", "HDL", "LDLCALC", "TRIG", "CHOLHDL", "LDLDIRECT" in the last 72 hours. Thyroid Function Tests: No results for input(s): "TSH", "T4TOTAL", "FREET4", "T3FREE", "THYROIDAB" in the last 72 hours. Anemia Panel: No results for input(s): "VITAMINB12", "FOLATE", "FERRITIN", "TIBC", "IRON", "RETICCTPCT" in the last 72 hours. Sepsis Labs: No results for input(s): "PROCALCITON", "LATICACIDVEN" in the last 168 hours.  No results found for this or any previous visit (from the past 240 hour(s)).       Radiology Studies: No results found.      Scheduled Meds:  (feeding supplement) PROSource Plus  30 mL Oral BID BM   alosetron  1 mg Oral BID   amitriptyline  25 mg Oral QHS   atorvastatin  40 mg Oral q1800   carvedilol  6.25 mg Oral BID   Chlorhexidine Gluconate Cloth  6 each Topical Q0600   cyclobenzaprine  10 mg Oral TID   darbepoetin (ARANESP) injection - DIALYSIS  60 mcg Intravenous Q Fri-HD   docusate sodium  200 mg Oral BID   DULoxetine  60 mg Oral Daily   famotidine  20 mg Oral Daily   fentaNYL  1 patch Transdermal Q72H   furosemide  80 mg Oral Daily   hydrALAZINE  25 mg Oral BID   insulin aspart  0-9 Units Subcutaneous TID WC   insulin aspart  4 Units Subcutaneous TID WC   insulin glargine-yfgn  7 Units Subcutaneous QHS   multivitamin  1 tablet Oral QHS   pantoprazole  80 mg Oral Daily   polyethylene glycol  17 g Oral Daily   sevelamer carbonate  2,400 mg Oral TID WC   sodium zirconium cyclosilicate  10 g Oral  Once per day on Sun Tue Thu Sat   Continuous Infusions:  methocarbamol (ROBAXIN) IV            Aline August, MD [facility] 12/21/2021, 7:52 AM

## 2021-12-21 NOTE — Progress Notes (Addendum)
[facility] Progress Note   Subjective:    Seen and examined patient on bedside. Tolerated yesterday's HD with net UF 4L. No acute complaints. Next HD 10/23.  Objective Vitals:   12/20/21 1644 12/20/21 2044 12/21/21 0619 12/21/21 0820  BP: 115/67 129/75 128/61 (!) 152/59  Pulse: 65 66 66 63  Resp: 18 18 18 18   Temp: 98.6 F (37 C) 98.9 F (37.2 C) 98.1 F (36.7 C) 98.1 F (36.7 C)  TempSrc: Oral Oral Oral Oral  SpO2: 96% 99% 100% 99%  Weight:      Height:       Physical Exam General:WDWN female in NAD Heart:RRR, no mrg Lungs:CTAB, nml WOB on RA Abdomen:soft, NTND, +flank edema Extremities:L BKA, trace to 1+ edema b/l Dialysis Access: RU AVF +b/t   Filed Weights   12/18/21 1226 12/20/21 0921 12/20/21 1315  Weight: 88.7 kg 93.7 kg 88.2 kg    Intake/Output Summary (Last 24 hours) at 12/21/2021 0840 Last data filed at 12/20/2021 2356 Gross per 24 hour  Intake 120 ml  Output 154 ml  Net -34 ml    Additional Objective Labs: Basic Metabolic Panel: Recent Labs  Lab 12/16/21 0346 12/18/21 0811  NA 127* 127*  K 4.8 4.1  CL 86* 87*  CO2 25 23  GLUCOSE 127* 162*  BUN 103* 82*  CREATININE 6.77* 6.60*  CALCIUM 9.1 9.4  PHOS 6.2* 6.7*   Liver Function Tests: Recent Labs  Lab 12/16/21 0346 12/18/21 0811  ALBUMIN 3.6 3.3*   No results for input(s): "LIPASE", "AMYLASE" in the last 168 hours. CBC: Recent Labs  Lab 12/16/21 0346 12/18/21 0811  WBC 7.8 7.8  HGB 9.6* 9.0*  HCT 28.8* 27.2*  MCV 82.8 84.2  PLT 250 242   Blood Culture    Component Value Date/Time   SDES TISSUE 08/28/2021 0917   SPECREQUEST LEFT THIGH TISSUE 08/28/2021 0917   CULT  08/28/2021 0917    MODERATE ENTEROCOCCUS FAECIUM VANCOMYCIN RESISTANT ENTEROCOCCUS ISOLATED NO ANAEROBES ISOLATED Sent to [facility] for further susceptibility testing. Performed at [facility], [place] [address], [place], [place] [address]    REPTSTATUS 09/02/2021 FINAL 08/28/2021 5638     Cardiac Enzymes: No results for input(s): "CKTOTAL", "CKMB", "CKMBINDEX", "TROPONINI" in the last 168 hours. CBG: Recent Labs  Lab 12/20/21 0745 12/20/21 1352 12/20/21 1643 12/20/21 2043 12/21/21 0727  GLUCAP 130* 132* 234* 165* 164*   Iron Studies: No results for input(s): "IRON", "TIBC", "TRANSFERRIN", "FERRITIN" in the last 72 hours. Lab Results  Component Value Date   INR 1.1 12/28/2019   INR 1.2 12/17/2018   INR 0.99 04/07/2017   Studies/Results: No results found.  Medications:  methocarbamol (ROBAXIN) IV      (feeding supplement) PROSource Plus  30 mL Oral BID BM   alosetron  1 mg Oral BID   amitriptyline  25 mg Oral QHS   atorvastatin  40 mg Oral q1800   carvedilol  6.25 mg Oral BID   Chlorhexidine Gluconate Cloth  6 each Topical Q0600   cyclobenzaprine  10 mg Oral TID   darbepoetin (ARANESP) injection - DIALYSIS  60 mcg Intravenous Q Fri-HD   docusate sodium  200 mg Oral BID   DULoxetine  60 mg Oral Daily   famotidine  20 mg Oral Daily   fentaNYL  1 patch Transdermal Q72H   furosemide  80 mg Oral Daily   hydrALAZINE  25 mg Oral BID   insulin aspart  0-9 Units Subcutaneous TID WC  insulin aspart  4 Units Subcutaneous TID WC   insulin glargine-yfgn  7 Units Subcutaneous QHS   multivitamin  1 tablet Oral QHS   pantoprazole  80 mg Oral Daily   polyethylene glycol  17 g Oral Daily   sevelamer carbonate  2,400 mg Oral TID WC   sodium zirconium cyclosilicate  10 g Oral Once per day on Sun Tue Thu Sat    Dialysis Orders: MWF at [facility], 400/500, EDW 90kg, 2K/2Ca, LUE AVF - heparin 4000 unit IV bolus - No ESA  Assessment/Plan:   Recurrent L hip osteomyelitis: S/p 08/10/21 surgery (removal hip IMN which was placed  12/2019) with persistent VRE infection. Back to OR 08/28/21, 08/31/21 for L hip debridement /wound vac  (now removed).  Completed 6 wk course of IV Daptomycin on 10/12/21. Management per primary team.  Chronic L hip/stump pain: She does  have fractures of L femoral neck (where hardware was removed) as well as distal femur Fx. Pain control per primary.  R thigh pain - +edema, CT ordered to r/o infection - not completed.  ABX started.  ESRD: MWF HD on schedule.  Next HD 10/23 per regular schedule.  Hyperkalemia: Recurrent issues, despite being in the hospital, 2K bath + furosemide. Lokelma 10g added on non-HD days -> now prn. 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 Virl Cagey. AVF working well now. HTN/volume: BP stable on hydralazine 25 mg bid/Coreg.  6.25 mg twice daily (hold a.m. of HD) Attempt taper DOWN  BP meds with UF on HD to prevent cramping.  LE and flank edema noted. Hx large gains. Stressed fluid and sodium restrictions.  Continue max UF as tolerated.  New EDW since admitted in the works.   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  Aranesp increased to 60q wk starting 10/20. Secondary HPTH: CorrCa slightly high, not on VDRA. Phos variable, usually 5-6. Continue Renvela as binder. Nutrition: Alb low, continue protein supplements. DM2: Insulin per primary Dispo: SNF previously recommended, now she would like to go home on discharge even though HD unit has been clear that they cannot assist patient out of her car and into the facility. Patient is aware of this.  Not home HD candidate currently/ dialyzed in recliner on 9/4 but signed off early due to pain, has declined to come in a recliner since then.Strongly discussed on the importance of increasing her mobility as much as possible while she is in her room in addition to following her ongoing fluid restriction. Also reinforced the importance of being in the chair for HD treatments to improve strength and mobility. Our goal is to dialyze her in the mornings so the patient has more time to work with PT in the afternoons (we will do our best to accommodate this as we still need to always consider the emergent cases).  Per [facility] Navigator notes there are no clinics that  will accept patient unless she will have HD in chair. Will continue to encourage use of chair for HD  Tobie Poet, NP [facility] 12/21/2021,8:40 AM  LOS: 117 days   Patient seen and examined, agree with above note with above modifications. Main issue with HD is trying to get enough volume off-  she is not adhering to restrictions and we are taking off as much as we can- persistent hyponatremia due to volume.  Next HD Monday   Will not be physically seen on Sunday, will revisit on Monday and arrange HD for  Monday- call with any concerns tomorrow  Corliss Parish, MD 12/21/2021

## 2021-12-22 DIAGNOSIS — I10 Essential (primary) hypertension: Secondary | ICD-10-CM | POA: Diagnosis not present

## 2021-12-22 DIAGNOSIS — M86252 Subacute osteomyelitis, left femur: Secondary | ICD-10-CM | POA: Diagnosis not present

## 2021-12-22 DIAGNOSIS — F419 Anxiety disorder, unspecified: Secondary | ICD-10-CM | POA: Diagnosis not present

## 2021-12-22 DIAGNOSIS — N186 End stage renal disease: Secondary | ICD-10-CM | POA: Diagnosis not present

## 2021-12-22 LAB — GLUCOSE, CAPILLARY
Glucose-Capillary: 116 mg/dL — ABNORMAL HIGH (ref 70–99)
Glucose-Capillary: 228 mg/dL — ABNORMAL HIGH (ref 70–99)
Glucose-Capillary: 187 mg/dL — ABNORMAL HIGH (ref 70–99)
Glucose-Capillary: 177 mg/dL — ABNORMAL HIGH (ref 70–99)

## 2021-12-22 MED ORDER — ALTEPLASE 2 MG IJ SOLR: 2.0000 mg | Freq: Once | INTRAMUSCULAR | Status: DC | PRN

## 2021-12-22 MED ORDER — CHLORHEXIDINE GLUCONATE CLOTH 2 % EX PADS: 6.0000 | MEDICATED_PAD | Freq: Every day | CUTANEOUS | Status: DC

## 2021-12-22 MED ORDER — PENTAFLUOROPROP-TETRAFLUOROETH EX AERO: 1.0000 | INHALATION_SPRAY | CUTANEOUS | Status: DC | PRN

## 2021-12-22 MED ORDER — HEPARIN SODIUM (PORCINE) 1000 UNIT/ML DIALYSIS
4000.0000 [IU] | Freq: Once | INTRAMUSCULAR | Status: DC
  Administered 2021-12-23: 4000 [IU] via INTRAVENOUS_CENTRAL

## 2021-12-22 MED ORDER — HEPARIN SODIUM (PORCINE) 1000 UNIT/ML DIALYSIS
1000.0000 [IU] | INTRAMUSCULAR | Status: DC | PRN
  Administered 2021-12-23: 2000 [IU] via INTRAVENOUS_CENTRAL
  Filled 2021-12-22 (×2): qty 1

## 2021-12-22 NOTE — Progress Notes (Signed)
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  Orthopedic/ID were consulted.  She was started on broad-spectrum antibiotics.  Underwent multiple I&D's by Dr. [name] along with local tissue rearrangement.  She was also seen by vascular surgery who did not fistula branch ligation.  She has completed 6-week course of daptomycin on 10/09/2021.  Discharge is pending ability for patient to tolerate hemodialysis in a chair.  Assessment & Plan:   Subacute osteomyelitis of left femur with abscess -Status post surgical intervention by Dr. [name].  She has completed 6-week course of daptomycin on 10/09/2021.  Outpatient follow-up with Dr. [name] 1 week after discharge -Continue current pain management -Reimaging on 12/02/2021 of left knee showed healing fracture with callus  Right thigh mild cellulitis -Controlled.  Completed 5-day course of antibiotics on 12/18/2021.  Symptoms most likely from anasarca.  History of left intertrochanteric fracture -Left hip hardware removed on 08/10/2021 prior to admission   End-stage renal disease on hemodialysis Anasarca -Nephrology following.  Dialysis as per nephrology schedule.  Still on Lasix.  Strict input and output with daily weights.  Fluid restriction -Has to do hemodialysis in chair prior to discharge to outpatient  hemodialysis  Hyponatremia -Management by hemodialysis.  Monitor intermittently.  Hyperphosphatemia--continue sevelamer.  Nephrology following.  Monitor intermittently.  Anemia of chronic disease -Hemoglobin currently stable.  Monitor intermittently  Diabetes mellitus type 2 with hyperglycemia -Continue long-acting insulin, NovoLog with meals along with CBGs with SSI  Hypertension -Monitor blood pressure.  Continue Coreg, hydralazine.  Off amlodipine  History of unspecified CVA Hyperlipidemia -Continue Lipitor  GERD -Continue Protonix and Pepcid  Tobacco use -Tobacco cessation was discussed by prior hospitalist during this hospitalization  Anxiety and depression -Continue amitriptyline and Cymbalta  Goals of care -Prognosis is guarded to poor.  Remains full code.  Palliative care has already evaluated the patient during this hospitalization   DVT prophylaxis: SCDs Code Status: Full Family Communication: None at bedside Disposition Plan: Status is: Inpatient Remains inpatient appropriate because: Difficult placement due to inability to sit in chair for hemodialysis  Consultants: Orthopedics/nephrology/vascular surgery/ID  Procedures: As above  Antimicrobials:  Anti-infectives (From admission, onward)    Start     Dose/Rate Route Frequency Ordered Stop   12/15/21 1730  cefTRIAXone (ROCEPHIN) 2 g in sodium chloride 0.9 % 100 mL IVPB        2 g 200 mL/hr over 30 Minutes Intravenous Every 24 hours 12/15/21 1633 12/18/21 1750   12/14/21 1800  doxycycline (VIBRA-TABS) tablet 100 mg  Status:  Discontinued        100 mg Oral Every 12 hours 12/14/21 1607 12/15/21 1633   10/02/21 2000  DAPTOmycin (CUBICIN) 900 mg in sodium chloride 0.9 % IVPB  Status:  Discontinued        10 mg/kg  92.4 kg 136 mL/hr over 30 Minutes Intravenous Once per day on Mon Wed Fri 10/01/21 1350 10/12/21 1146   09/30/21 2000  DAPTOmycin (CUBICIN) 900 mg in sodium  chloride 0.9 % IVPB  Status:   Discontinued        10 mg/kg  92.4 kg 136 mL/hr over 30 Minutes Intravenous Daily 09/28/21 0919 10/01/21 1350   09/28/21 2000  DAPTOmycin (CUBICIN) 900 mg in sodium chloride 0.9 % IVPB        900 mg 136 mL/hr over 30 Minutes Intravenous  Once 09/28/21 0919 09/28/21 2146   09/02/21 2000  DAPTOmycin (CUBICIN) 900 mg in sodium chloride 0.9 % IVPB  Status:  Discontinued        10 mg/kg  92.4 kg 136 mL/hr over 30 Minutes Intravenous Once per day on Mon Wed Fri 09/02/21 0836 09/28/21 0919   08/31/21 0745  ceFAZolin (ANCEF) IVPB 2g/100 mL premix        2 g 200 mL/hr over 30 Minutes Intravenous To Short Stay 08/31/21 0730 08/31/21 0918   08/30/21 0600  ceFAZolin (ANCEF) IVPB 2g/100 mL premix  Status:  Discontinued        2 g 200 mL/hr over 30 Minutes Intravenous To Short Stay 08/30/21 0123 08/31/21 0600   08/29/21 2000  DAPTOmycin (CUBICIN) 900 mg in sodium chloride 0.9 % IVPB  Status:  Discontinued        10 mg/kg  92.4 kg 136 mL/hr over 30 Minutes Intravenous Every 48 hours 08/29/21 1627 09/02/21 0836   08/28/21 1800  ceFAZolin (ANCEF) IVPB 2g/100 mL premix  Status:  Discontinued        2 g 200 mL/hr over 30 Minutes Intravenous Every M-W-F (1800) 08/27/21 0846 08/29/21 1620   08/28/21 1200  ceFAZolin (ANCEF) IVPB 1 g/50 mL premix  Status:  Discontinued        1 g 100 mL/hr over 30 Minutes Intravenous Every M-W-F (Hemodialysis) 08/26/21 1954 08/27/21 0846   08/28/21 0600  ceFAZolin (ANCEF) IVPB 2g/100 mL premix        2 g 200 mL/hr over 30 Minutes Intravenous On call to O.R. 08/27/21 1150 08/28/21 0909   08/27/21 1800  ceFAZolin (ANCEF) IVPB 1 g/50 mL premix        1 g 100 mL/hr over 30 Minutes Intravenous  Once 08/26/21 1954 08/27/21 2048   08/26/21 1500  ceFEPIme (MAXIPIME) 1 g in sodium chloride 0.9 % 100 mL IVPB  Status:  Discontinued        1 g 200 mL/hr over 30 Minutes Intravenous Every 24 hours 08/26/21 1452 08/26/21 1714        Subjective: Patient seen and examined at  bedside.  Poor historian.  No chest pain, fever, vomiting or agitation reported.   Objective: Vitals:   12/21/21 0820 12/21/21 1700 12/21/21 2114 12/22/21 0615  BP: (!) 152/59 (!) 147/56 125/65 117/70  Pulse: 63 65 (!) 59 62  Resp: 18 17 18 18   Temp: 98.1 F (36.7 C) 99 F (37.2 C) 97.9 F (36.6 C) 98.1 F (36.7 C)  TempSrc: Oral Oral Oral Oral  SpO2: 99% 99% 100% 100%  Weight:      Height:        Intake/Output Summary (Last 24 hours) at 12/22/2021 0815 Last data filed at 12/22/2021 0743 Gross per 24 hour  Intake 1600 ml  Output 370 ml  Net 1230 ml    Filed Weights   12/18/21 1226 12/20/21 0921 12/20/21 1315  Weight: 88.7 kg 93.7 kg 88.2 kg    Examination:  General exam: No acute distress currently.  Still on room air.  Chronically ill and deconditioned.  Still slow to respond.  Poor historian. Respiratory system: Bilateral decreased breath sounds at bases with some crackles  cardiovascular system: S1 S2 heard; rate mostly controlled currently  gastrointestinal system: Abdomen is still distended, soft and nontender.  Bowel sounds heard normally. extremities: Has left BKA with dressing  Data Reviewed: I have personally reviewed following labs and imaging studies  CBC: Recent Labs  Lab 12/16/21 0346 12/18/21 0811  WBC 7.8 7.8  HGB 9.6* 9.0*  HCT 28.8* 27.2*  MCV 82.8 84.2  PLT 250 010    Basic Metabolic Panel: Recent Labs  Lab 12/16/21 0346 12/18/21 0811  NA 127* 127*  K 4.8 4.1  CL 86* 87*  CO2 25 23  GLUCOSE 127* 162*  BUN 103* 82*  CREATININE 6.77* 6.60*  CALCIUM 9.1 9.4  PHOS 6.2* 6.7*    GFR: Estimated Creatinine Clearance: 11.8 mL/min (A) (by C-G formula based on SCr of 6.6 mg/dL (H)). Liver Function Tests: Recent Labs  Lab 12/16/21 0346 12/18/21 0811  ALBUMIN 3.6 3.3*    No results for input(s): "LIPASE", "AMYLASE" in the last 168 hours. No results for input(s): "AMMONIA" in the last 168 hours. Coagulation Profile: No results for  input(s): "INR", "PROTIME" in the last 168 hours. Cardiac Enzymes: No results for input(s): "CKTOTAL", "CKMB", "CKMBINDEX", "TROPONINI" in the last 168 hours. BNP (last 3 results) No results for input(s): "PROBNP" in the last 8760 hours. HbA1C: No results for input(s): "HGBA1C" in the last 72 hours. CBG: Recent Labs  Lab 12/21/21 0727 12/21/21 1124 12/21/21 1658 12/21/21 2113 12/22/21 0730  GLUCAP 164* 224* 172* 166* 116*    Lipid Profile: No results for input(s): "CHOL", "HDL", "LDLCALC", "TRIG", "CHOLHDL", "LDLDIRECT" in the last 72 hours. Thyroid Function Tests: No results for input(s): "TSH", "T4TOTAL", "FREET4", "T3FREE", "THYROIDAB" in the last 72 hours. Anemia Panel: No results for input(s): "VITAMINB12", "FOLATE", "FERRITIN", "TIBC", "IRON", "RETICCTPCT" in the last 72 hours. Sepsis Labs: No results for input(s): "PROCALCITON", "LATICACIDVEN" in the last 168 hours.  No results found for this or any previous visit (from the past 240 hour(s)).       Radiology Studies: No results found.      Scheduled Meds:  (feeding supplement) PROSource Plus  30 mL Oral BID BM   alosetron  1 mg Oral BID   amitriptyline  25 mg Oral QHS   atorvastatin  40 mg Oral q1800   carvedilol  6.25 mg Oral BID   cyclobenzaprine  10 mg Oral TID   darbepoetin (ARANESP) injection - DIALYSIS  60 mcg Intravenous Q Fri-HD   docusate sodium  200 mg Oral BID   DULoxetine  60 mg Oral Daily   famotidine  20 mg Oral Daily   fentaNYL  1 patch Transdermal Q72H   furosemide  80 mg Oral Daily   hydrALAZINE  25 mg Oral BID   insulin aspart  0-9 Units Subcutaneous TID WC   insulin aspart  4 Units Subcutaneous TID WC   insulin glargine-yfgn  7 Units Subcutaneous QHS   multivitamin  1 tablet Oral QHS   pantoprazole  80 mg Oral Daily   polyethylene glycol  17 g Oral Daily   sevelamer carbonate  2,400 mg Oral TID WC   sodium zirconium cyclosilicate  10 g Oral Once per day on Sun Tue Thu Sat    Continuous Infusions:  methocarbamol (ROBAXIN) IV            Aline August, MD [facility] 12/22/2021, 8:15 AM

## 2021-12-22 NOTE — Plan of Care (Signed)
  Problem: Activity: Goal: Risk for activity intolerance will decrease Outcome: Progressing   Problem: Pain Managment: Goal: General experience of comfort will improve Outcome: Progressing   

## 2021-12-23 DIAGNOSIS — M86252 Subacute osteomyelitis, left femur: Secondary | ICD-10-CM | POA: Diagnosis not present

## 2021-12-23 LAB — RENAL FUNCTION PANEL
Albumin: 3.6 g/dL (ref 3.5–5.0)
Anion gap: 14 (ref 5–15)
BUN: 89 mg/dL — ABNORMAL HIGH (ref 6–20)
CO2: 26 mmol/L (ref 22–32)
Calcium: 9.2 mg/dL (ref 8.9–10.3)
Chloride: 87 mmol/L — ABNORMAL LOW (ref 98–111)
Creatinine, Ser: 6.93 mg/dL — ABNORMAL HIGH (ref 0.44–1.00)
GFR, Estimated: 7 mL/min — ABNORMAL LOW (ref >60)
Glucose, Bld: 129 mg/dL — ABNORMAL HIGH (ref 70–99)
Phosphorus: 5.6 mg/dL — ABNORMAL HIGH (ref 2.5–4.6)
Potassium: 5 mmol/L (ref 3.5–5.1)
Sodium: 127 mmol/L — ABNORMAL LOW (ref 135–145)

## 2021-12-23 LAB — CBC
HCT: 28.2 % — ABNORMAL LOW (ref 36.0–46.0)
Hemoglobin: 9.3 g/dL — ABNORMAL LOW (ref 12.0–15.0)
MCH: 28.1 pg (ref 26.0–34.0)
MCHC: 33 g/dL (ref 30.0–36.0)
MCV: 85.2 fL (ref 80.0–100.0)
Platelets: 233 10*3/uL (ref 150–400)
RBC: 3.31 MIL/uL — ABNORMAL LOW (ref 3.87–5.11)
RDW: 16.3 % — ABNORMAL HIGH (ref 11.5–15.5)
WBC: 7.3 10*3/uL (ref 4.0–10.5)
nRBC: 0 % (ref 0.0–0.2)

## 2021-12-23 LAB — GLUCOSE, CAPILLARY
Glucose-Capillary: 155 mg/dL — ABNORMAL HIGH (ref 70–99)
Glucose-Capillary: 133 mg/dL — ABNORMAL HIGH (ref 70–99)
Glucose-Capillary: 192 mg/dL — ABNORMAL HIGH (ref 70–99)
Glucose-Capillary: 223 mg/dL — ABNORMAL HIGH (ref 70–99)

## 2021-12-23 MED ORDER — ALBUMIN HUMAN 25 % IV SOLN: 12.5000 g | INTRAVENOUS | Status: DC | PRN

## 2021-12-23 NOTE — Progress Notes (Signed)
Received patient in bed to unit.  Alert and oriented.  Informed consent signed and in chart.   Treatment initiated: 0826 Treatment completed: 1321  Patient tolerated well. BFR 400-250 as tolerated d/t low arterial pressure alarms and elevated TMP pressure alarms Transported back to the room  Alert, without acute distress.  Hand-off given to patient's nurse.   Access used: fistula left arm  Access issues: yes. Needles positional. Low A/V alarm pressures  Total UF removed: 4 liters  Medication(s) given: Heparin 4,000 units, heparin 2,000 units, oxycodone, zofran Post HD VS: 112/53 HR 74 RR 18 Sat 100% on room air  Post HD weight: 89.7 kg   [facility]

## 2021-12-23 NOTE — Procedures (Signed)
Seen and examined on dialysis.  Blood pressure 118/50 and HR 66. Procedure supervised.  Tolerating goal.  AVF in use.   Hyponatremia - follow with UF.  Trace to 1+ edema right LE.   Claudia Desanctis, MD 12/23/2021  9:53 AM

## 2021-12-23 NOTE — Progress Notes (Signed)
PROGRESS NOTE    [name]  UXL:[id number] DOB: 1966-02-24 DOA: 08/26/2021 PCP: [name], MD   Brief Narrative:  This 56 year old female with history of hypertension, hyperlipidemia, diabetes mellitus type 2, end-stage renal disease on hemodialysis, left BKA for chronic calcaneus osteomyelitis on 01/12/2020, bipolar disorder, left distal femoral fracture after a fall on 07/19/2021 deemed  nonoperative, admission from 08/03/2021-08/23/2021 for left humeral osteomyelitis requiring partial resection of femur and rearrangements of bony fragments with subsequent antibiotic treatment presented with left hip bleeding and was found to have subacute osteomyelitis and abscess of left femur.  Orthopedic/ID were consulted.  She was started on broad-spectrum antibiotics. She underwent multiple I&D's by Dr. [name] along with local tissue rearrangement. She was also seen by vascular surgery who did not fistula branch ligation.  She has completed 6-week course of daptomycin on 10/09/2021.  Discharge is pending ability for patient to tolerate hemodialysis in a chair.  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  Active Problems:   ESRD (end stage renal disease) on dialysis ([place])   Type 2 diabetes mellitus with hyperlipidemia (HCC)   History of CVA (cerebrovascular accident)   Essential hypertension   Anxiety and depression   GASTROESOPHAGEAL REFLUX, NO ESOPHAGITIS   TOBACCO DEPENDENCE   Hyponatremia   Obesity (BMI 30.0-34.9)   Unilateral amputation of left foot (HCC)   Wound infection   History of CVA (cerebrovascular accident) without residual deficits   Hyperkalemia   Wound dehiscence, surgical, sequela  Subacute osteomyelitis of left femur with abscess: -Status post surgical intervention by Dr. [name].   She has completed 6-week course of daptomycin on 10/09/2021.   Plan: Outpatient follow-up with Dr. [name] 1 week after discharge. Continue current pain  management Re-imaging on 12/02/2021 of left knee showed healing fracture with callus.   Right thigh mild cellulitis: Completed 5-day course of antibiotics on 12/18/2021.   Symptoms most likely from anasarca. Improving   History of left intertrochanteric fracture: Left hip hardware removed on 08/10/2021 prior to admission. Continue adequate pain control.   End-stage renal disease on hemodialysis: She has generalized anasarca. Nephrology following. Continue Dialysis as per nephrology schedule.   Continue Lasix.  Strict input and output with daily weights.  Fluid restriction She has to do hemodialysis in chair prior to discharge to outpatient hemodialysis.   Hyponatremia: Management by hemodialysis.  Monitor intermittently.   Hyperphosphatemia: Continue sevelamer.   Nephrology following.  Monitor intermittently.   Anemia of chronic disease H&H currently stable.  Monitor intermittently   Type 2 diabetes with hyperglycemia Continue long-acting insulin, NovoLog with meals along with CBGs with SSI   Essential hypertension: Continue Coreg, hydralazine.  Off amlodipine. Blood pressure stable.   History of unspecified CVA Hyperlipidemia Continue Lipitor   GERD Continue Protonix and Pepcid.   Tobacco use Tobacco cessation counseled.   Anxiety and depression: Continue amitriptyline and Cymbalta.   Goals of care discussion: Prognosis is guarded to poor.  Remains full code.   Palliative care has already evaluated the patient during this hospitalization    DVT prophylaxis: SCDs Code Status: Full code Family Communication: No family at bedside. Disposition Plan:   Status is: Inpatient Remains inpatient appropriate because: Difficult placement due to inability to sit in a hemodialysis chair for dialysis.   Consultants:  Nephrology Orthopedics Vascular surgery ID  Procedures: Multiple incision and drainages. Antimicrobials:  Anti-infectives (From admission, onward)     Start     Dose/Rate Route  Frequency Ordered Stop   12/15/21 1730  cefTRIAXone (ROCEPHIN) 2 g in sodium chloride 0.9 % 100 mL IVPB        2 g 200 mL/hr over 30 Minutes Intravenous Every 24 hours 12/15/21 1633 12/18/21 1750   12/14/21 1800  doxycycline (VIBRA-TABS) tablet 100 mg  Status:  Discontinued        100 mg Oral Every 12 hours 12/14/21 1607 12/15/21 1633   10/02/21 2000  DAPTOmycin (CUBICIN) 900 mg in sodium chloride 0.9 % IVPB  Status:  Discontinued        10 mg/kg  92.4 kg 136 mL/hr over 30 Minutes Intravenous Once per day on Mon Wed Fri 10/01/21 1350 10/12/21 1146   09/30/21 2000  DAPTOmycin (CUBICIN) 900 mg in sodium chloride 0.9 % IVPB  Status:  Discontinued        10 mg/kg  92.4 kg 136 mL/hr over 30 Minutes Intravenous Daily 09/28/21 0919 10/01/21 1350   09/28/21 2000  DAPTOmycin (CUBICIN) 900 mg in sodium chloride 0.9 % IVPB        900 mg 136 mL/hr over 30 Minutes Intravenous  Once 09/28/21 0919 09/28/21 2146   09/02/21 2000  DAPTOmycin (CUBICIN) 900 mg in sodium chloride 0.9 % IVPB  Status:  Discontinued        10 mg/kg  92.4 kg 136 mL/hr over 30 Minutes Intravenous Once per day on Mon Wed Fri 09/02/21 0836 09/28/21 0919   08/31/21 0745  ceFAZolin (ANCEF) IVPB 2g/100 mL premix        2 g 200 mL/hr over 30 Minutes Intravenous To Short Stay 08/31/21 0730 08/31/21 0918   08/30/21 0600  ceFAZolin (ANCEF) IVPB 2g/100 mL premix  Status:  Discontinued        2 g 200 mL/hr over 30 Minutes Intravenous To Short Stay 08/30/21 0123 08/31/21 0600   08/29/21 2000  DAPTOmycin (CUBICIN) 900 mg in sodium chloride 0.9 % IVPB  Status:  Discontinued        10 mg/kg  92.4 kg 136 mL/hr over 30 Minutes Intravenous Every 48 hours 08/29/21 1627 09/02/21 0836   08/28/21 1800  ceFAZolin (ANCEF) IVPB 2g/100 mL premix  Status:  Discontinued        2 g 200 mL/hr over 30 Minutes Intravenous Every M-W-F (1800) 08/27/21 0846 08/29/21 1620   08/28/21 1200  ceFAZolin (ANCEF) IVPB 1 g/50 mL premix   Status:  Discontinued        1 g 100 mL/hr over 30 Minutes Intravenous Every M-W-F (Hemodialysis) 08/26/21 1954 08/27/21 0846   08/28/21 0600  ceFAZolin (ANCEF) IVPB 2g/100 mL premix        2 g 200 mL/hr over 30 Minutes Intravenous On call to O.R. 08/27/21 1150 08/28/21 0909   08/27/21 1800  ceFAZolin (ANCEF) IVPB 1 g/50 mL premix        1 g 100 mL/hr over 30 Minutes Intravenous  Once 08/26/21 1954 08/27/21 2048   08/26/21 1500  ceFEPIme (MAXIPIME) 1 g in sodium chloride 0.9 % 100 mL IVPB  Status:  Discontinued        1 g 200 mL/hr over 30 Minutes Intravenous Every 24 hours 08/26/21 1452 08/26/21 1714        Subjective: Patient was seen and examined at hemodialysis unit.  Patient denies any symptoms. Patient was getting dialysis while in the bed.  Patient denies any chest pain or shortness of breath.  Objective: Vitals:   12/23/21 1030 12/23/21 1100 12/23/21 1130 12/23/21 1200  BP: (!) 109/47 (!) 113/54 (!) 94/49 113/60  Pulse:      Resp: 13 10 10  (!) 9  Temp:      TempSrc:      SpO2: 100% 100% 100%   Weight:      Height:        Intake/Output Summary (Last 24 hours) at 12/23/2021 1210 Last data filed at 12/23/2021 0600 Gross per 24 hour  Intake 680 ml  Output 0 ml  Net 680 ml   Filed Weights   12/20/21 0921 12/20/21 1315 12/23/21 0831  Weight: 93.7 kg 88.2 kg 93.2 kg    Examination:  General exam: Appears comfortable, not in any acute distress, remains on room air, deconditioned. Respiratory system: Decreased breath sounds at bases, respiratory effort normal, RR 15 Cardiovascular system: S1 & S2 heard, regular rate and rhythm, no murmur. Gastrointestinal system: Abdomen is soft, nontender, Mildly distended, BS+ Central nervous system: Alert and oriented x 2. No focal neurological deficits. Extremities: Left BKA, with dressing Skin: No rashes, lesions or ulcers Psychiatry:  Mood & affect appropriate.   Data Reviewed: I have personally reviewed following labs and  imaging studies  CBC: Recent Labs  Lab 12/18/21 0811 12/23/21 0820  WBC 7.8 7.3  HGB 9.0* 9.3*  HCT 27.2* 28.2*  MCV 84.2 85.2  PLT 242 407   Basic Metabolic Panel: Recent Labs  Lab 12/18/21 0811 12/23/21 0820  NA 127* 127*  K 4.1 5.0  CL 87* 87*  CO2 23 26  GLUCOSE 162* 129*  BUN 82* 89*  CREATININE 6.60* 6.93*  CALCIUM 9.4 9.2  PHOS 6.7* 5.6*   GFR: Estimated Creatinine Clearance: 11.5 mL/min (A) (by C-G formula based on SCr of 6.93 mg/dL (H)). Liver Function Tests: Recent Labs  Lab 12/18/21 0811 12/23/21 0820  ALBUMIN 3.3* 3.6   No results for input(s): "LIPASE", "AMYLASE" in the last 168 hours. No results for input(s): "AMMONIA" in the last 168 hours. Coagulation Profile: No results for input(s): "INR", "PROTIME" in the last 168 hours. Cardiac Enzymes: No results for input(s): "CKTOTAL", "CKMB", "CKMBINDEX", "TROPONINI" in the last 168 hours. BNP (last 3 results) No results for input(s): "PROBNP" in the last 8760 hours. HbA1C: No results for input(s): "HGBA1C" in the last 72 hours. CBG: Recent Labs  Lab 12/22/21 0730 12/22/21 1122 12/22/21 1635 12/22/21 2100 12/23/21 0726  GLUCAP 116* 228* 187* 177* 155*   Lipid Profile: No results for input(s): "CHOL", "HDL", "LDLCALC", "TRIG", "CHOLHDL", "LDLDIRECT" in the last 72 hours. Thyroid Function Tests: No results for input(s): "TSH", "T4TOTAL", "FREET4", "T3FREE", "THYROIDAB" in the last 72 hours. Anemia Panel: No results for input(s): "VITAMINB12", "FOLATE", "FERRITIN", "TIBC", "IRON", "RETICCTPCT" in the last 72 hours. Sepsis Labs: No results for input(s): "PROCALCITON", "LATICACIDVEN" in the last 168 hours.  No results found for this or any previous visit (from the past 240 hour(s)).   Radiology Studies: No results found.  Scheduled Meds:  (feeding supplement) PROSource Plus  30 mL Oral BID BM   alosetron  1 mg Oral BID   amitriptyline  25 mg Oral QHS   atorvastatin  40 mg Oral q1800    carvedilol  6.25 mg Oral BID   cyclobenzaprine  10 mg Oral TID   darbepoetin (ARANESP) injection - DIALYSIS  60 mcg Intravenous Q Fri-HD   docusate sodium  200 mg Oral BID   DULoxetine  60 mg Oral Daily   famotidine  20 mg Oral Daily   fentaNYL  1 patch Transdermal Q72H  furosemide  80 mg Oral Daily   heparin  4,000 Units Dialysis Once in dialysis   hydrALAZINE  25 mg Oral BID   insulin aspart  0-9 Units Subcutaneous TID WC   insulin aspart  4 Units Subcutaneous TID WC   insulin glargine-yfgn  7 Units Subcutaneous QHS   multivitamin  1 tablet Oral QHS   pantoprazole  80 mg Oral Daily   polyethylene glycol  17 g Oral Daily   sevelamer carbonate  2,400 mg Oral TID WC   sodium zirconium cyclosilicate  10 g Oral Once per day on Sun Tue Thu Sat   Continuous Infusions:  albumin human     methocarbamol (ROBAXIN) IV       LOS: 119 days    Time spent: 50 mins  Shawna Clamp, MD [facility]   If 7PM-7AM, please contact night-coverage

## 2021-12-23 NOTE — Progress Notes (Addendum)
[facility] Progress Note   Subjective:   Patient seen and examined at bedside in dialysis.  Tolerating treatment well so far.  Ordered mid dose heparin bolus d/t patient clotting machine.  Denies CP, SOB, abdominal pain and v/d.  Continues to have LE edema and nausea. Did not work with PT over the weekend but reports she did some exercise herself in bed.   Objective Vitals:   12/23/21 1000 12/23/21 1030 12/23/21 1100 12/23/21 1130  BP: (!) 100/49 (!) 109/47 (!) 113/54 (!) 94/49  Pulse:      Resp: 10 13 10 10   Temp:      TempSrc:      SpO2: 99% 100% 100% 100%  Weight:      Height:       Physical Exam General:WDWN female in NAD Heart:RRR, no mrg Lungs:CTAB, nml WOB on RA Abdomen:soft, NTND, +flank edema Extremities:2+ RLE edema, 1+ stump edema on L, +L BKA  Dialysis Access: RU AVF +b/t   Filed Weights   12/20/21 0921 12/20/21 1315 12/23/21 0831  Weight: 93.7 kg 88.2 kg 93.2 kg    Intake/Output Summary (Last 24 hours) at 12/23/2021 1138 Last data filed at 12/23/2021 0600 Gross per 24 hour  Intake 680 ml  Output 0 ml  Net 680 ml    Additional Objective Labs: Basic Metabolic Panel: Recent Labs  Lab 12/18/21 0811 12/23/21 0820  NA 127* 127*  K 4.1 5.0  CL 87* 87*  CO2 23 26  GLUCOSE 162* 129*  BUN 82* 89*  CREATININE 6.60* 6.93*  CALCIUM 9.4 9.2  PHOS 6.7* 5.6*   Liver Function Tests: Recent Labs  Lab 12/18/21 0811 12/23/21 0820  ALBUMIN 3.3* 3.6    CBC: Recent Labs  Lab 12/18/21 0811 12/23/21 0820  WBC 7.8 7.3  HGB 9.0* 9.3*  HCT 27.2* 28.2*  MCV 84.2 85.2  PLT 242 233   CBG: Recent Labs  Lab 12/22/21 0730 12/22/21 1122 12/22/21 1635 12/22/21 2100 12/23/21 0726  GLUCAP 116* 228* 187* 177* 155*    Medications:  methocarbamol (ROBAXIN) IV      (feeding supplement) PROSource Plus  30 mL Oral BID BM   alosetron  1 mg Oral BID   amitriptyline  25 mg Oral QHS   atorvastatin  40 mg Oral q1800   carvedilol  6.25 mg Oral  BID   cyclobenzaprine  10 mg Oral TID   darbepoetin (ARANESP) injection - DIALYSIS  60 mcg Intravenous Q Fri-HD   docusate sodium  200 mg Oral BID   DULoxetine  60 mg Oral Daily   famotidine  20 mg Oral Daily   fentaNYL  1 patch Transdermal Q72H   furosemide  80 mg Oral Daily   heparin  4,000 Units Dialysis Once in dialysis   hydrALAZINE  25 mg Oral BID   insulin aspart  0-9 Units Subcutaneous TID WC   insulin aspart  4 Units Subcutaneous TID WC   insulin glargine-yfgn  7 Units Subcutaneous QHS   multivitamin  1 tablet Oral QHS   pantoprazole  80 mg Oral Daily   polyethylene glycol  17 g Oral Daily   sevelamer carbonate  2,400 mg Oral TID WC   sodium zirconium cyclosilicate  10 g Oral Once per day on Sun Tue Thu Sat    Dialysis Orders: MWF at [facility] 4hr, 400/500, EDW 90kg, 2K/2Ca, LUE AVF - heparin 4000 unit IV bolus - No ESA   Assessment/Plan:    Recurrent L hip osteomyelitis:  S/p 08/10/21 surgery (removal hip IMN which was placed  12/2019) with persistent VRE infection. Back to OR 08/28/21, 08/31/21 for L hip debridement /wound vac  (now removed).  Completed 6 wk course of IV Daptomycin on 10/12/21. Management per primary team.  Chronic L hip/stump pain: She does have fractures of L femoral neck (where hardware was removed) as well as distal femur Fx. Pain control per primary.  R thigh pain - +edema, CT ordered to r/o infection - not completed.  ABX started.  ESRD: MWF HD on schedule.  HD today per regular schedule. Adjust HD orders for 4000 unit heparin bolus + 2000 min run d/t clotting mid/end treatment.  Hyperkalemia: Recurrent issues, despite being in the hospital, 2K bath + furosemide. Lokelma 10g added on non-HD days -> now prn. 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 Virl Cagey. AVF working well now. HTN/volume: BP stable on hydralazine 25 mg bid/Coreg.  6.25 mg twice daily (hold a.m. of HD) Attempt taper DOWN  BP meds with UF on HD to prevent  cramping.  LE and flank edema noted. Hx large gains. Stressed fluid and sodium restrictions.  Continue max UF as tolerated.  New EDW since admitted in the works.   Anemia of ESRD: Hgb 9.3 tsat 54%.  Aranesp increased to 60q wk starting 10/20. Secondary HPTH: CorrCa slightly high, not on VDRA. Phos variable, usually 5-6. Continue Renvela as binder. Nutrition: Alb low, continue protein supplements. DM2: Insulin per primary Dispo: SNF previously recommended, now she would like to go home on discharge even though HD unit has been clear that they cannot assist patient out of her car and into the facility. Patient is aware of this.  Not home HD candidate currently/ dialyzed in recliner on 9/4 but signed off early due to pain, has declined to come in a recliner since then.Strongly discussed on the importance of increasing her mobility as much as possible while she is in her room in addition to following her ongoing fluid restriction. Also reinforced the importance of being in the chair for HD treatments to improve strength and mobility. Our goal is to dialyze her in the mornings so the patient has more time to work with PT in the afternoons (we will do our best to accommodate this as we still need to always consider the emergent cases).  Per [facility]igator notes there are no clinics that will accept patient unless she will have HD in chair. Will continue to encourage use of chair for HD  Jen Mow, PA-C [place] 12/23/2021,11:38 AM  LOS: 119 days   See my procedure note from today.    Seen and examined independently.  Agree with note and exam as documented above by physician extender and as noted here.  Claudia Desanctis, MD 12/23/2021  2:11 PM

## 2021-12-24 DIAGNOSIS — M86252 Subacute osteomyelitis, left femur: Secondary | ICD-10-CM | POA: Diagnosis not present

## 2021-12-24 LAB — GLUCOSE, CAPILLARY
Glucose-Capillary: 151 mg/dL — ABNORMAL HIGH (ref 70–99)
Glucose-Capillary: 174 mg/dL — ABNORMAL HIGH (ref 70–99)
Glucose-Capillary: 159 mg/dL — ABNORMAL HIGH (ref 70–99)

## 2021-12-24 MED ORDER — CHLORHEXIDINE GLUCONATE CLOTH 2 % EX PADS
6.0000 | MEDICATED_PAD | Freq: Every day | CUTANEOUS | Status: DC
  Administered 2021-12-29: 6 via TOPICAL

## 2021-12-24 NOTE — Progress Notes (Signed)
PROGRESS NOTE    [name]  WYO:[id number] DOB: [birth year] DOA: 08/26/2021 PCP: [name], MD   Brief Narrative:  This 56 year old female with history of hypertension, hyperlipidemia, diabetes mellitus type 2, end-stage renal disease on hemodialysis, left BKA for chronic calcaneus osteomyelitis on 01/12/2020, bipolar disorder, left distal femoral fracture after a fall on 07/19/2021 deemed  nonoperative, admission from 08/03/2021-08/23/2021 for left humeral osteomyelitis requiring partial resection of femur and rearrangements of bony fragments with subsequent antibiotic treatment presented with left hip bleeding and was found to have subacute osteomyelitis and abscess of left femur.  Orthopedic/ID were consulted.  She was started on broad-spectrum antibiotics. She underwent multiple I&D's by Dr. [name] along with local tissue rearrangement. She was also seen by vascular surgery who did not fistula branch ligation.  She has completed 6-week course of daptomycin on 10/09/2021.  Discharge is pending ability for patient to tolerate hemodialysis in a chair.  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  Active Problems:   ESRD (end stage renal disease) on dialysis ([place])   Type 2 diabetes mellitus with hyperlipidemia (HCC)   History of CVA (cerebrovascular accident)   Essential hypertension   Anxiety and depression   GASTROESOPHAGEAL REFLUX, NO ESOPHAGITIS   TOBACCO DEPENDENCE   Hyponatremia   Obesity (BMI 30.0-34.9)   Unilateral amputation of left foot (HCC)   Wound infection   History of CVA (cerebrovascular accident) without residual deficits   Hyperkalemia   Wound dehiscence, surgical, sequela  Subacute osteomyelitis of left femur with abscess: -Status post surgical intervention by Dr. [name].   She has completed 6-week course of daptomycin on 10/09/2021.   Plan: Outpatient follow-up with Dr. [name] 1 week after discharge. Continue current pain  management Re-imaging on 12/02/2021 of left knee showed healing fracture with callus.   Right thigh mild cellulitis: Completed 5-day course of antibiotics on 12/18/2021.   Symptoms most likely from anasarca. Improving   History of left intertrochanteric fracture: Left hip hardware removed on 08/10/2021 prior to admission. Continue adequate pain control.   End-stage renal disease on hemodialysis: She has generalized anasarca. Nephrology following. Continue Dialysis as per nephrology schedule.   Continue Lasix.  Strict input and output with daily weights.  Fluid restriction She has to do hemodialysis in chair prior to discharge to outpatient hemodialysis. She is working with physical therapy. Plan for dialysis in the chair tomorrow.   Hyponatremia: Management by hemodialysis.  Monitor intermittently.   Hyperphosphatemia: Continue sevelamer.   Nephrology following.  Monitor intermittently.   Anemia of chronic disease H&H currently stable.  Monitor intermittently   Type 2 diabetes with hyperglycemia Continue long-acting insulin, NovoLog with meals along with CBGs with SSI   Essential hypertension: Continue Coreg, hydralazine.  Off amlodipine. Blood pressure stable.   History of unspecified CVA Hyperlipidemia Continue Lipitor   GERD Continue Protonix and Pepcid.   Tobacco use Tobacco cessation counseled.   Anxiety and depression: Continue amitriptyline and Cymbalta.   Goals of care discussion: Prognosis is guarded to poor.  Remains full code.   Palliative care has already evaluated the patient during this hospitalization    DVT prophylaxis: SCDs Code Status: Full code Family Communication: No family at bedside. Disposition Plan:   Status is: Inpatient Remains inpatient appropriate because: Difficult placement due to inability to sit in a hemodialysis chair for dialysis. Patient is working with physical therapy.  Plan for dialysis in the chair tomorrow.    Consultants:  Nephrology Orthopedics Vascular surgery ID  Procedures: Multiple incision and drainages. Antimicrobials:  Anti-infectives (From admission, onward)    Start     Dose/Rate Route Frequency Ordered Stop   12/15/21 1730  cefTRIAXone (ROCEPHIN) 2 g in sodium chloride 0.9 % 100 mL IVPB        2 g 200 mL/hr over 30 Minutes Intravenous Every 24 hours 12/15/21 1633 12/18/21 1750   12/14/21 1800  doxycycline (VIBRA-TABS) tablet 100 mg  Status:  Discontinued        100 mg Oral Every 12 hours 12/14/21 1607 12/15/21 1633   10/02/21 2000  DAPTOmycin (CUBICIN) 900 mg in sodium chloride 0.9 % IVPB  Status:  Discontinued        10 mg/kg  92.4 kg 136 mL/hr over 30 Minutes Intravenous Once per day on Mon Wed Fri 10/01/21 1350 10/12/21 1146   09/30/21 2000  DAPTOmycin (CUBICIN) 900 mg in sodium chloride 0.9 % IVPB  Status:  Discontinued        10 mg/kg  92.4 kg 136 mL/hr over 30 Minutes Intravenous Daily 09/28/21 0919 10/01/21 1350   09/28/21 2000  DAPTOmycin (CUBICIN) 900 mg in sodium chloride 0.9 % IVPB        900 mg 136 mL/hr over 30 Minutes Intravenous  Once 09/28/21 0919 09/28/21 2146   09/02/21 2000  DAPTOmycin (CUBICIN) 900 mg in sodium chloride 0.9 % IVPB  Status:  Discontinued        10 mg/kg  92.4 kg 136 mL/hr over 30 Minutes Intravenous Once per day on Mon Wed Fri 09/02/21 0836 09/28/21 0919   08/31/21 0745  ceFAZolin (ANCEF) IVPB 2g/100 mL premix        2 g 200 mL/hr over 30 Minutes Intravenous To Short Stay 08/31/21 0730 08/31/21 0918   08/30/21 0600  ceFAZolin (ANCEF) IVPB 2g/100 mL premix  Status:  Discontinued        2 g 200 mL/hr over 30 Minutes Intravenous To Short Stay 08/30/21 0123 08/31/21 0600   08/29/21 2000  DAPTOmycin (CUBICIN) 900 mg in sodium chloride 0.9 % IVPB  Status:  Discontinued        10 mg/kg  92.4 kg 136 mL/hr over 30 Minutes Intravenous Every 48 hours 08/29/21 1627 09/02/21 0836   08/28/21 1800  ceFAZolin (ANCEF) IVPB 2g/100 mL premix  Status:   Discontinued        2 g 200 mL/hr over 30 Minutes Intravenous Every M-W-F (1800) 08/27/21 0846 08/29/21 1620   08/28/21 1200  ceFAZolin (ANCEF) IVPB 1 g/50 mL premix  Status:  Discontinued        1 g 100 mL/hr over 30 Minutes Intravenous Every M-W-F (Hemodialysis) 08/26/21 1954 08/27/21 0846   08/28/21 0600  ceFAZolin (ANCEF) IVPB 2g/100 mL premix        2 g 200 mL/hr over 30 Minutes Intravenous On call to O.R. 08/27/21 1150 08/28/21 0909   08/27/21 1800  ceFAZolin (ANCEF) IVPB 1 g/50 mL premix        1 g 100 mL/hr over 30 Minutes Intravenous  Once 08/26/21 1954 08/27/21 2048   08/26/21 1500  ceFEPIme (MAXIPIME) 1 g in sodium chloride 0.9 % 100 mL IVPB  Status:  Discontinued        1 g 200 mL/hr over 30 Minutes Intravenous Every 24 hours 08/26/21 1452 08/26/21 1714        Subjective: Patient was seen and examined at bedside.  Patient denies any overnight events. Patient states she is trying  with physical therapy, she is putting her effort to sit but develops pain.  Objective: Vitals:   12/23/21 1349 12/23/21 1608 12/23/21 2124 12/24/21 0842  BP:  (!) 118/55 111/68 (!) 131/55  Pulse:  69 69 69  Resp:  18  18  Temp:  98.6 F (37 C) 98.2 F (36.8 C) 98.2 F (36.8 C)  TempSrc:  Oral Oral Oral  SpO2:  98% 98% 100%  Weight: 89.7 kg     Height:        Intake/Output Summary (Last 24 hours) at 12/24/2021 1225 Last data filed at 12/24/2021 0800 Gross per 24 hour  Intake 320 ml  Output 4000 ml  Net -3680 ml   Filed Weights   12/20/21 1315 12/23/21 0831 12/23/21 1349  Weight: 88.2 kg 93.2 kg 89.7 kg    Examination:  General exam: Appears comfortable, NAD, remains on room air, deconditioned.   Respiratory system: Decreased breath sounds at bases, respiratory effort normal, RR 17 Cardiovascular system: S1 & S2 heard, regular rate and rhythm, no murmur. Gastrointestinal system: Abdomen is soft, nontender, Mildly distended, BS+ Central nervous system: Alert and oriented x3.  No focal neurological deficits. Extremities: Left BKA, dressing noted. Skin: No rashes, lesions or ulcers Psychiatry:  Mood & affect appropriate.   Data Reviewed: I have personally reviewed following labs and imaging studies  CBC: Recent Labs  Lab 12/18/21 0811 12/23/21 0820  WBC 7.8 7.3  HGB 9.0* 9.3*  HCT 27.2* 28.2*  MCV 84.2 85.2  PLT 242 161   Basic Metabolic Panel: Recent Labs  Lab 12/18/21 0811 12/23/21 0820  NA 127* 127*  K 4.1 5.0  CL 87* 87*  CO2 23 26  GLUCOSE 162* 129*  BUN 82* 89*  CREATININE 6.60* 6.93*  CALCIUM 9.4 9.2  PHOS 6.7* 5.6*   GFR: Estimated Creatinine Clearance: 11.3 mL/min (A) (by C-G formula based on SCr of 6.93 mg/dL (H)). Liver Function Tests: Recent Labs  Lab 12/18/21 0811 12/23/21 0820  ALBUMIN 3.3* 3.6   No results for input(s): "LIPASE", "AMYLASE" in the last 168 hours. No results for input(s): "AMMONIA" in the last 168 hours. Coagulation Profile: No results for input(s): "INR", "PROTIME" in the last 168 hours. Cardiac Enzymes: No results for input(s): "CKTOTAL", "CKMB", "CKMBINDEX", "TROPONINI" in the last 168 hours. BNP (last 3 results) No results for input(s): "PROBNP" in the last 8760 hours. HbA1C: No results for input(s): "HGBA1C" in the last 72 hours. CBG: Recent Labs  Lab 12/23/21 1400 12/23/21 1606 12/23/21 2123 12/24/21 0719 12/24/21 1109  GLUCAP 133* 192* 223* 151* 174*   Lipid Profile: No results for input(s): "CHOL", "HDL", "LDLCALC", "TRIG", "CHOLHDL", "LDLDIRECT" in the last 72 hours. Thyroid Function Tests: No results for input(s): "TSH", "T4TOTAL", "FREET4", "T3FREE", "THYROIDAB" in the last 72 hours. Anemia Panel: No results for input(s): "VITAMINB12", "FOLATE", "FERRITIN", "TIBC", "IRON", "RETICCTPCT" in the last 72 hours. Sepsis Labs: No results for input(s): "PROCALCITON", "LATICACIDVEN" in the last 168 hours.  No results found for this or any previous visit (from the past 240 hour(s)).    Radiology Studies: No results found.  Scheduled Meds:  (feeding supplement) PROSource Plus  30 mL Oral BID BM   alosetron  1 mg Oral BID   amitriptyline  25 mg Oral QHS   atorvastatin  40 mg Oral q1800   carvedilol  6.25 mg Oral BID   Chlorhexidine Gluconate Cloth  6 each Topical Q0600   cyclobenzaprine  10 mg Oral TID   darbepoetin (ARANESP) injection -  DIALYSIS  60 mcg Intravenous Q Fri-HD   docusate sodium  200 mg Oral BID   DULoxetine  60 mg Oral Daily   famotidine  20 mg Oral Daily   fentaNYL  1 patch Transdermal Q72H   furosemide  80 mg Oral Daily   insulin aspart  0-9 Units Subcutaneous TID WC   insulin aspart  4 Units Subcutaneous TID WC   insulin glargine-yfgn  7 Units Subcutaneous QHS   multivitamin  1 tablet Oral QHS   pantoprazole  80 mg Oral Daily   polyethylene glycol  17 g Oral Daily   sevelamer carbonate  2,400 mg Oral TID WC   sodium zirconium cyclosilicate  10 g Oral Once per day on Sun Tue Thu Sat   Continuous Infusions:  albumin human     methocarbamol (ROBAXIN) IV       LOS: 120 days    Time spent: 35 mins  Harlean Regula, MD [facility]   If 7PM-7AM, please contact night-coverage

## 2021-12-24 NOTE — Progress Notes (Addendum)
[facility] Progress Note   Subjective:   Patient seen and examined at bedside.  Reports some clotting toward the end of treatment but otherwise tolerated well.  Plans to work with PT today.    Objective Vitals:   12/23/21 1330 12/23/21 1349 12/23/21 1608 12/23/21 2124  BP: (!) 112/53  (!) 118/55 111/68  Pulse:   69 69  Resp: 18  18   Temp:   98.6 F (37 C) 98.2 F (36.8 C)  TempSrc:   Oral Oral  SpO2:   98% 98%  Weight:  89.7 kg    Height:       Physical Exam General:WDWN female in NAD Heart:RRR, no mrg Lungs:mostly CTAB, fine crackles in RLL Abdomen:soft, NTND, +flank edema Extremities:1+ edema LLE, no stump edema, R BKA Dialysis Access: RU AVF +b/t   Filed Weights   12/20/21 1315 12/23/21 0831 12/23/21 1349  Weight: 88.2 kg 93.2 kg 89.7 kg    Intake/Output Summary (Last 24 hours) at 12/24/2021 0805 Last data filed at 12/24/2021 0200 Gross per 24 hour  Intake 220 ml  Output 4000 ml  Net -3780 ml    Additional Objective Labs: Basic Metabolic Panel: Recent Labs  Lab 12/18/21 0811 12/23/21 0820  NA 127* 127*  K 4.1 5.0  CL 87* 87*  CO2 23 26  GLUCOSE 162* 129*  BUN 82* 89*  CREATININE 6.60* 6.93*  CALCIUM 9.4 9.2  PHOS 6.7* 5.6*   Liver Function Tests: Recent Labs  Lab 12/18/21 0811 12/23/21 0820  ALBUMIN 3.3* 3.6   CBC: Recent Labs  Lab 12/18/21 0811 12/23/21 0820  WBC 7.8 7.3  HGB 9.0* 9.3*  HCT 27.2* 28.2*  MCV 84.2 85.2  PLT 242 233   CBG: Recent Labs  Lab 12/23/21 0726 12/23/21 1400 12/23/21 1606 12/23/21 2123 12/24/21 0719  GLUCAP 155* 133* 192* 223* 151*   Medications:  albumin human     methocarbamol (ROBAXIN) IV      (feeding supplement) PROSource Plus  30 mL Oral BID BM   alosetron  1 mg Oral BID   amitriptyline  25 mg Oral QHS   atorvastatin  40 mg Oral q1800   carvedilol  6.25 mg Oral BID   cyclobenzaprine  10 mg Oral TID   darbepoetin (ARANESP) injection - DIALYSIS  60 mcg Intravenous Q Fri-HD    docusate sodium  200 mg Oral BID   DULoxetine  60 mg Oral Daily   famotidine  20 mg Oral Daily   fentaNYL  1 patch Transdermal Q72H   furosemide  80 mg Oral Daily   hydrALAZINE  25 mg Oral BID   insulin aspart  0-9 Units Subcutaneous TID WC   insulin aspart  4 Units Subcutaneous TID WC   insulin glargine-yfgn  7 Units Subcutaneous QHS   multivitamin  1 tablet Oral QHS   pantoprazole  80 mg Oral Daily   polyethylene glycol  17 g Oral Daily   sevelamer carbonate  2,400 mg Oral TID WC   sodium zirconium cyclosilicate  10 g Oral Once per day on Sun Tue Thu Sat    Dialysis Orders: MWF at [facility], 400/500, EDW 90kg, 2K/2Ca, LUE AVF - heparin 4000 unit IV bolus - No ESA   Assessment/Plan:    Recurrent L hip osteomyelitis: S/p 08/10/21 surgery (removal hip IMN which was placed 12/2019) with persistent VRE infection. Back to OR 08/28/21, 08/31/21 for L hip debridement /wound vac  (now removed).  Completed 6 wk course  of IV Daptomycin on 10/12/21. Management per primary team.  Chronic L hip/stump pain: She does have fractures of L femoral neck (where hardware was removed) as well as distal femur Fx. Pain control per primary.  R thigh pain - +edema, CT ordered to r/o infection - not completed.  ESRD: MWF HD on schedule.  HD tomorrow per regular schedule. Clotting during treatment, add 2000 unit mid run dose heparin + 4000 unit heparin bolus. Hyperkalemia: Resolved. 2K bath + furosemide. Lokelma 10g now prn. last K+ 5.0 AVF dysfunction: S/p branch ligation 09/19/2021  Dr. Virl Cagey. AVF working well now. HTN/volume: BP stable on hydralazine 25 mg bid/Coreg 6.25 mg BID (hold a.m. of HD) - d/c hydralazine 10/24.  Continue to attempt taper DOWN BP meds with UF on HD to prevent cramping.  Ongoing LE and flank edema noted. Hx large gains. Stressed fluid and sodium restrictions.  Continue max UF as tolerated.  New EDW since admitted in the works.   Anemia of ESRD: last Hgb 9.3 tsat 54%.  Aranesp  increased to 60q wk starting 10/20. Secondary HPTH: CorrCa slightly high, not on VDRA. Phos variable, usually 5-6. Continue Renvela as binder. Nutrition: Alb low, continue protein supplements.  Renal diet w/fluid restrictions.  DM2: Insulin per primary Dispo: SNF previously recommended, now she would like to go home on discharge even though HD unit has been clear that they cannot assist patient out of her car and into the facility. Patient is aware of this.  Not home HD candidate currently/ dialyzed in recliner on 9/4 but signed off early due to pain, has declined to come in a recliner since then.Strongly discussed on the importance of increasing her mobility as much as possible while she is in her room in addition to following her ongoing fluid restriction. Also reinforced the importance of being in the chair for HD treatments to improve strength and mobility. Our goal is to dialyze her in the mornings so the patient has more time to work with PT in the afternoons (we will do our best to accommodate this as we still need to always consider the emergent cases).  Per Renal Navigator notes there are no clinics that will accept patient unless she will have HD in chair. Will continue to encourage use of chair for HD  Jen Mow, PA-C [facility] 12/24/2021,8:05 AM  LOS: 120 days   Seen and examined independently.  Agree with note and exam as documented above by physician extender and as noted here.  General adult female in bed in no acute distress HEENT normocephalic atraumatic extraocular movements intact sclera anicteric Neck supple trachea midline Lungs clear to auscultation bilaterally normal work of breathing at rest  Heart S1S2 no rub Abdomen soft nontender nondistended Extremities 1+ edema right LE Psych normal mood and affect Access LUE AVF bruit and thrill   ESRD on HD - needs HD in a chair.  HD MWF.  Try for chair tomorrow if willing  Osteomyelitis s/p abx per  primary team   HTN - optimize volume with HD  Claudia Desanctis, MD 12/24/2021 11:00 AM

## 2021-12-24 NOTE — Progress Notes (Signed)
Mobility Specialist Progress Note:   12/24/21 1620  Mobility  Activity Transferred from chair to bed  Level of Assistance Minimal assist, patient does 75% or more  Assistive Device Sliding board  Activity Response Tolerated well  Mobility Referral Yes  $Mobility charge 1 Mobility   Pt agreeable to transfer back to bed. Only MinA required to place sliding board. Pt left with all needs met.    [name] Acute Rehab Secure Chat or Office Phone: [phone]

## 2021-12-24 NOTE — Progress Notes (Signed)
Physical Therapy Treatment Patient Details Name: [name] MRN: [id number] DOB: 1965/10/13 Today's Date: 12/24/2021   History of Present Illness 56 y.o. female presented to ED 6/26 from dialysis with increased bloody drainage from L hip wound. 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 s/p hardware removal of left hip with partial resection of tuft tissue and femure that were nonviable on 08/10/21 Discharged home 6/23. underwent L hip debridement and wound vac placement 6/28; +Left intertrochanteric non-union,  Fracture of left distal femur  PMH: hypertension, hyperlipidemia, ESRD on HD MWF, history of left BKA in 2021, depression/anxiety, stroke, tobacco use, T2DM,  insomnia, chronic pain syndrome,    PT Comments    Continuing to work on functional mobility and activity tolerance. Session focused on independent transfers, knee extension, and hip IR; knee extension goal was 30 degrees, 28 degrees achieved; hip IR was -70 degrees at rest, -60 degrees with PT assist; hip rolling back and forth for stretching LE, back mobility, and pressure relief. Supine-to-sit minguard assist w/ cueing for UE, especially leaning on L elbow for R hip hike/weight shift; pt put on sleeve; lateral scoot transfer to [facility] using slide board, min A, using R leg to WB;   Pt comfortable seated in recliner; pt shown how to lower and elevate recliner independently for potential dialysis positioning, and was successful; Encouraged pt to recline to as flat as she needed to when uncomfortable to allow for more time/tolerance of sitting up; pt plan of care remains appropriate; Will discuss with HD leadership; Will continue to follow.   Recommendations for follow up therapy are one component of a multi-disciplinary discharge planning process, led by the attending physician.  Recommendations may be updated based on patient status, additional functional criteria and insurance authorization.  Follow  Up Recommendations  Skilled nursing-short term rehab (<3 hours/day) (pt currently refusing due to inability to tolerate sitting in chair for 6+ hours as transported to/from dialysis) Can patient physically be transported by private vehicle: No   Assistance Recommended at Discharge Intermittent Supervision/Assistance  Patient can return home with the following A lot of help with bathing/dressing/bathroom;Assistance with cooking/housework;Assist for transportation;Two people to help with walking and/or transfers;Help with stairs or ramp for entrance   Equipment Recommendations  None recommended by PT    Recommendations for Other Services       Precautions / Restrictions Precautions Precautions: Fall;Other (comment) Precaution Comments: L BKA (baseline) Restrictions LLE Weight Bearing: Weight bearing as tolerated Other Position/Activity Restrictions: L distal femur fx; per Dr [name] 8/10 pt can full WB     Mobility  Bed Mobility Overal bed mobility: Needs Assistance Bed Mobility: Rolling, Sit to Supine Rolling: Modified independent (Device/Increase time) Sidelying to sit: Min assist       General bed mobility comments: HOB elevated; lots of cues to push up from R sidelying with R hand    Transfers Overall transfer level: Needs assistance Equipment used: Sliding board Transfers: Bed to chair/wheelchair/BSC            Lateral/Scoot Transfers: Min assist, With slide board General transfer comment: pt required assist to place sliding board and to anchor board as she initiated slide to recliner; Cues to shift weihgt onto R foot to unweigh hips for smoother scooting; no physical assist for moving herself across the board    Ambulation/Gait                   Stairs  Wheelchair Mobility    Modified Rankin (Stroke Patients Only)       Balance     Sitting balance-Leahy Scale: Fair                                       Cognition Arousal/Alertness: Awake/alert Behavior During Therapy: WFL for tasks assessed/performed Overall Cognitive Status: Impaired/Different from baseline Area of Impairment: Awareness                         Safety/Judgement: Decreased awareness of deficits, Decreased awareness of safety Awareness: Emergent   General Comments: continues to push for going home with OP HD despite inability to transfer to/from car        Exercises Other Exercises Other Exercises: left knee contract-relax stretching into extension with hold x 60 seconds x 2 sets Other Exercises: after stretching, bolster placed under left lateral thigh to promote internal rotation Other Exercises: gentle bilateral hip rocking in semi-sidelying (LLE supoprted    General Comments General comments (skin integrity, edema, etc.): Donned black shrinker; measured L knee extension, approximated L hip IR      Pertinent Vitals/Pain Pain Assessment Pain Assessment: Faces Faces Pain Scale: Hurts even more Pain Location: left hip primarily Pain Descriptors / Indicators: Discomfort, Grimacing, Guarding Pain Intervention(s): Limited activity within patient's tolerance    Home Living                          Prior Function            PT Goals (current goals can now be found in the care plan section) Acute Rehab PT Goals Patient Stated Goal: less pain with movement PT Goal Formulation: With patient Time For Goal Achievement: 01/01/22 Potential to Achieve Goals: Fair Progress towards PT goals: Progressing toward goals (slowly)    Frequency    Min 2X/week      PT Plan Current plan remains appropriate    Co-evaluation              AM-PAC PT "6 Clicks" Mobility   Outcome Measure  Help needed turning from your back to your side while in a flat bed without using bedrails?: None Help needed moving from lying on your back to sitting on the side of a flat bed without using bedrails?:  None Help needed moving to and from a bed to a chair (including a wheelchair)?: A Little Help needed standing up from a chair using your arms (e.g., wheelchair or bedside chair)?: Total Help needed to walk in hospital room?: Total Help needed climbing 3-5 steps with a railing? : Total 6 Click Score: 14    End of Session   Activity Tolerance: Patient tolerated treatment well Patient left: with call bell/phone within reach;in bed Nurse Communication: Mobility status;Other (comment) (MT plans to put pt back to bed at 2:30-3:00) PT Visit Diagnosis: Other abnormalities of gait and mobility (R26.89);Muscle weakness (generalized) (M62.81);History of falling (Z91.81);Pain Pain - Right/Left: Left Pain - part of body: Leg     Time: 1337-1430 (Minus 20 min for student experience) PT Time Calculation (min) (ACUTE ONLY): 53 min  Charges:  $Therapeutic Activity: 23-37 mins                     Roney Marion, PT  Acute Rehabilitation Services Office [phone]    [place]  H Antoniette Peake 12/24/2021, 5:30 PM

## 2021-12-25 DIAGNOSIS — M86252 Subacute osteomyelitis, left femur: Secondary | ICD-10-CM | POA: Diagnosis not present

## 2021-12-25 LAB — RENAL FUNCTION PANEL
Albumin: 3.4 g/dL — ABNORMAL LOW (ref 3.5–5.0)
Anion gap: 17 — ABNORMAL HIGH (ref 5–15)
BUN: 70 mg/dL — ABNORMAL HIGH (ref 6–20)
CO2: 27 mmol/L (ref 22–32)
Calcium: 9.4 mg/dL (ref 8.9–10.3)
Chloride: 84 mmol/L — ABNORMAL LOW (ref 98–111)
Creatinine, Ser: 6.48 mg/dL — ABNORMAL HIGH (ref 0.44–1.00)
GFR, Estimated: 7 mL/min — ABNORMAL LOW (ref >60)
Glucose, Bld: 204 mg/dL — ABNORMAL HIGH (ref 70–99)
Phosphorus: 5.6 mg/dL — ABNORMAL HIGH (ref 2.5–4.6)
Potassium: 4.4 mmol/L (ref 3.5–5.1)
Sodium: 128 mmol/L — ABNORMAL LOW (ref 135–145)

## 2021-12-25 LAB — CBC
HCT: 27.3 % — ABNORMAL LOW (ref 36.0–46.0)
Hemoglobin: 9.1 g/dL — ABNORMAL LOW (ref 12.0–15.0)
MCH: 28.7 pg (ref 26.0–34.0)
MCHC: 33.3 g/dL (ref 30.0–36.0)
MCV: 86.1 fL (ref 80.0–100.0)
Platelets: 224 10*3/uL (ref 150–400)
RBC: 3.17 MIL/uL — ABNORMAL LOW (ref 3.87–5.11)
RDW: 17 % — ABNORMAL HIGH (ref 11.5–15.5)
WBC: 8.1 10*3/uL (ref 4.0–10.5)
nRBC: 0 % (ref 0.0–0.2)

## 2021-12-25 LAB — GLUCOSE, CAPILLARY
Glucose-Capillary: 147 mg/dL — ABNORMAL HIGH (ref 70–99)
Glucose-Capillary: 186 mg/dL — ABNORMAL HIGH (ref 70–99)
Glucose-Capillary: 189 mg/dL — ABNORMAL HIGH (ref 70–99)
Glucose-Capillary: 237 mg/dL — ABNORMAL HIGH (ref 70–99)

## 2021-12-25 LAB — HEPATITIS B SURFACE ANTIBODY,QUALITATIVE: Hep B S Ab: NONREACTIVE

## 2021-12-25 LAB — HEPATITIS B SURFACE ANTIGEN: Hepatitis B Surface Ag: NONREACTIVE

## 2021-12-25 MED ORDER — SENNOSIDES-DOCUSATE SODIUM 8.6-50 MG PO TABS: 1.0000 | ORAL_TABLET | Freq: Every evening | ORAL | Status: DC | PRN

## 2021-12-25 MED ORDER — HEPARIN SODIUM (PORCINE) 1000 UNIT/ML IJ SOLN
INTRAMUSCULAR | Status: AC
  Administered 2021-12-25: 1000 [IU]
  Filled 2021-12-25: qty 4

## 2021-12-25 MED ORDER — HYDRALAZINE HCL 20 MG/ML IJ SOLN
10.0000 mg | INTRAMUSCULAR | Status: DC | PRN
  Administered 2022-01-11: 10 mg via INTRAVENOUS
  Filled 2021-12-25: qty 1

## 2021-12-25 MED ORDER — PENTAFLUOROPROP-TETRAFLUOROETH EX AERO
INHALATION_SPRAY | CUTANEOUS | Status: AC
  Administered 2021-12-25: 30
  Filled 2021-12-25: qty 30

## 2021-12-25 MED ORDER — METOPROLOL TARTRATE 5 MG/5ML IV SOLN: 5.0000 mg | INTRAVENOUS | Status: DC | PRN

## 2021-12-25 MED ORDER — IPRATROPIUM-ALBUTEROL 0.5-2.5 (3) MG/3ML IN SOLN: 3.0000 mL | RESPIRATORY_TRACT | Status: DC | PRN

## 2021-12-25 NOTE — Progress Notes (Addendum)
[facility] Progress Note   Subjective:   Patient seen and examined at bedside in dialysis.  Frustrated about being asked to do HD in chair.  States she is working with PT to build up tolerance to having HD in chair.  Sat for 2.5hrs in chair yesterday.  Concerned she will get stuck in the chair for 6-7 hours.  Otherwise doing ok.  Tolerating dialysis in chair.  Continues to have pain in b/l LE.  Denies CP, SOB, abdominal pain and n/v/d.   Objective Vitals:   12/25/21 1030 12/25/21 1100 12/25/21 1130 12/25/21 1200  BP: (!) 125/52 (!) 149/51 (!) 121/111 126/67  Pulse: 69 62 63 62  Resp:  14 14 10   Temp:      TempSrc:      SpO2: 100% 100% 100% 100%  Weight:      Height:       Physical Exam General:WDWN female in NAD Heart:RRR, no mrg Lungs:CTAB, nml WOB on RA Abdomen:soft, NTND, +flank edema Extremities:1+ edema in b/l LE, L BKA Dialysis Access: RU AVF +b/t   Filed Weights   12/20/21 1315 12/23/21 0831 12/23/21 1349  Weight: 88.2 kg 93.2 kg 89.7 kg    Intake/Output Summary (Last 24 hours) at 12/25/2021 1207 Last data filed at 12/25/2021 0800 Gross per 24 hour  Intake 980 ml  Output --  Net 980 ml    Additional Objective Labs: Basic Metabolic Panel: Recent Labs  Lab 12/23/21 0820 12/25/21 0852  NA 127* 128*  K 5.0 4.4  CL 87* 84*  CO2 26 27  GLUCOSE 129* 204*  BUN 89* 70*  CREATININE 6.93* 6.48*  CALCIUM 9.2 9.4  PHOS 5.6* 5.6*   Liver Function Tests: Recent Labs  Lab 12/23/21 0820 12/25/21 0852  ALBUMIN 3.6 3.4*   CBC: Recent Labs  Lab 12/23/21 0820 12/25/21 0852  WBC 7.3 8.1  HGB 9.3* 9.1*  HCT 28.2* 27.3*  MCV 85.2 86.1  PLT 233 224   CBG: Recent Labs  Lab 12/23/21 2123 12/24/21 0719 12/24/21 1109 12/24/21 1617 12/25/21 0736  GLUCAP 223* 151* 174* 159* 147*    Medications:  albumin human     methocarbamol (ROBAXIN) IV      (feeding supplement) PROSource Plus  30 mL Oral BID BM   alosetron  1 mg Oral BID    amitriptyline  25 mg Oral QHS   atorvastatin  40 mg Oral q1800   carvedilol  6.25 mg Oral BID   Chlorhexidine Gluconate Cloth  6 each Topical Q0600   cyclobenzaprine  10 mg Oral TID   darbepoetin (ARANESP) injection - DIALYSIS  60 mcg Intravenous Q Fri-HD   docusate sodium  200 mg Oral BID   DULoxetine  60 mg Oral Daily   famotidine  20 mg Oral Daily   fentaNYL  1 patch Transdermal Q72H   furosemide  80 mg Oral Daily   heparin sodium (porcine)       insulin aspart  0-9 Units Subcutaneous TID WC   insulin aspart  4 Units Subcutaneous TID WC   insulin glargine-yfgn  7 Units Subcutaneous QHS   multivitamin  1 tablet Oral QHS   pantoprazole  80 mg Oral Daily   pentafluoroprop-tetrafluoroeth       polyethylene glycol  17 g Oral Daily   sevelamer carbonate  2,400 mg Oral TID WC   sodium zirconium cyclosilicate  10 g Oral Once per day on Sun Tue Thu Sat    Dialysis Orders: MWF at  [facility] 4hr, 400/500, EDW 90kg, 2K/2Ca, LUE AVF - heparin 4000 unit IV bolus - No ESA   Assessment/Plan:    Recurrent L hip osteomyelitis: S/p 08/10/21 surgery (removal hip IMN which was placed 12/2019) with persistent VRE infection. Back to OR 08/28/21, 08/31/21 for L hip debridement /wound vac  (now removed).  Completed 6 wk course of IV Daptomycin on 10/12/21. Management per primary team.  Chronic L hip/stump pain: She does have fractures of L femoral neck (where hardware was removed) as well as distal femur Fx. Pain control per primary.  R thigh pain - +edema, CT ordered to r/o infection - not completed.  ESRD: MWF HD on schedule.  HD today per regular schedule. Clotting during treatment, add 2000 unit mid run dose heparin + 4000 unit heparin bolus. Hyperkalemia: Resolved. 2K bath + furosemide. Lokelma 10g now prn. last K+ 5.0 AVF dysfunction: S/p branch ligation 09/19/2021  Dr. Virl Cagey. AVF working well now. HTN/volume: BP stable on hydralazine 25 mg bid/Coreg 6.25 mg BID (hold a.m. of HD) - d/c  hydralazine 10/24.  Continue to attempt taper DOWN BP meds with UF on HD to prevent cramping.  Ongoing LE and flank edema noted. Hx large gains. Stressed fluid and sodium restrictions.  Continue max UF as tolerated.  New EDW since admitted in the works.   Anemia of ESRD: last Hgb 9.1 tsat 54%.  Aranesp increased to 60q wk starting 10/20. Secondary HPTH: CorrCa slightly high, not on VDRA. Phos variable, usually 5-6. Continue Renvela as binder. Nutrition: Alb low, continue protein supplements.  Renal diet w/fluid restrictions.  DM2: Insulin per primary Dispo: SNF previously recommended, now she would like to go home on discharge even though HD unit has been clear that they cannot assist patient out of her car and into the facility. Patient is aware of this.  Not home HD candidate currently/ dialyzed in recliner on 9/4 but signed off early due to pain, has declined to come in a recliner since then.Strongly discussed on the importance of increasing her mobility as much as possible while she is in her room in addition to following her ongoing fluid restriction. Also reinforced the importance of being in the chair for HD treatments to improve strength and mobility. Our goal is to dialyze her in the mornings so the patient has more time to work with PT in the afternoons (we will do our best to accommodate this as we still need to always consider the emergent cases).  She has worked up to tolerating 2.5hrs in the chair so far.  Encouraged ongoing progress. Per Renal Navigator notes there are no clinics that will accept patient unless she will have HD in chair. Will continue to encourage use of chair for HD  Jen Mow, PA-C [place] 12/25/2021,12:07 PM  LOS: 121 days   Seen and examined independently.  Agree with note and exam as documented above by physician extender and as noted here.  See procedure note from today.  Needs to do HD in a chair.  She will not attempt.  Has been here  121 days.   Claudia Desanctis, MD 12/25/2021  6:15 PM

## 2021-12-25 NOTE — Progress Notes (Signed)
Received patient in bed to unit.  Alert and oriented.  Informed consent signed and in chart.   Treatment initiated: 71219 Treatment completed: 1348  Patient tolerated well.  Transported back to the room  Alert, without acute distress.  Hand-off given to patient's nurse.   Access used: Avfistula  Access issues: none  Total UF removed: 4L Medication(s) given: n/a Post HD VS: 758/83,25.4,98,26,41 Post HD weight: 90.5kg   [facility]

## 2021-12-25 NOTE — Procedures (Signed)
Seen and examined on dialysis.  Blood pressure 125/52 and HR 62.  Left AVF in use.  Tolerating goal.  Procedure supervised.  She refused to do HD in a chair today. When we discussed this she stated that it is "bulls**t" that we are asking her to dialyze in a chair.  We discussed that this was a critical step toward being able to go home and to have an outpatient HD unit accept her  Claudia Desanctis, MD 12/25/2021 10:46 AM

## 2021-12-25 NOTE — Progress Notes (Signed)
12/25/2021 7:48 AM  Patient refused to get in Dialysis chair for treatment this morning. Stated that she has told the floor doctor she is not going to while her leg continues to hurt.   Informed [facility] that patient will arrive in bed and not chair as ordered.   Nephrologist notified.   Vee Bahe Annamaria Boots MSN, RN-BC, [place] Nursing Director Phone: [phone]

## 2021-12-25 NOTE — Progress Notes (Signed)
PROGRESS NOTE    [name]  WPY:[id number] DOB: 08/16/1965 DOA: 08/26/2021 PCP: [name], MD   Brief Narrative:   57 year old female with history of hypertension, hyperlipidemia, diabetes mellitus type 2, end-stage renal disease on hemodialysis, left BKA for chronic calcaneus osteomyelitis on 01/12/2020, bipolar disorder, left distal femoral fracture after a fall on 07/19/2021 deemed  nonoperative, admission from 08/03/2021-08/23/2021 for left humeral osteomyelitis requiring partial resection of femur and rearrangements of bony fragments with subsequent antibiotic treatment presented with left hip bleeding and was found to have subacute osteomyelitis and abscess of left femur.  Orthopedic/ID were consulted.  She was started on broad-spectrum antibiotics. She underwent multiple I&D's by Dr. [name] along with local tissue rearrangement. She was also seen by vascular surgery who did not fistula branch ligation.  She has completed 6-week course of daptomycin on 10/09/2021.  Discharge is pending ability for patient to tolerate hemodialysis in a chair.     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  Active Problems:   ESRD (end stage renal disease) on dialysis ([place])   Type 2 diabetes mellitus with hyperlipidemia (HCC)   History of CVA (cerebrovascular accident)   Essential hypertension   Anxiety and depression   GASTROESOPHAGEAL REFLUX, NO ESOPHAGITIS   TOBACCO DEPENDENCE   Hyponatremia   Obesity (BMI 30.0-34.9)   Unilateral amputation of left foot (HCC)   Wound infection   History of CVA (cerebrovascular accident) without residual deficits   Hyperkalemia   Wound dehiscence, surgical, sequela   Subacute osteomyelitis of left femur with abscess: -Status post surgical intervention by Dr. [name].  Completed 6 weeks of daptomycin on 8/9 and was supposed to follow-up outpatient with Dr. [name] in 1 week thereafter.  Reimaging performed on 10/2 showed  healing fractures with callus.  Continue pain management at this time. She has completed 6-week course of daptomycin on 10/09/2021.     Right thigh mild cellulitis: Completed 5-day course of antibiotics on 12/18/2021.   Symptoms most likely from anasarca. Improving   History of left intertrochanteric fracture: Left hip hardware removed on 08/10/2021 prior to admission. Continue adequate pain control.   End-stage renal disease on hemodialysis: She has generalized anasarca.  Nephrology team is following.  Due to significant weakness she is not being able to tolerate hemodialysis in chair therefore hindering her safe disposition plan.  Nephrology team to try dialysis in chair today. There is concerns that patient is not strong enough to be discharged home and get routine outpatient dialysis   Hyponatremia/hyperphosphatemia Management with hemodialysis On sevelamer   Anemia of chronic disease H&H currently stable.  Monitor intermittently   Type 2 diabetes with hyperglycemia Continue long-acting insulin, NovoLog with meals along with CBGs with SSI.  For the most part adequately controlled   Essential hypertension: On Coreg, daily Lasix.  IV as needed ordered   History of unspecified CVA Hyperlipidemia Continue Lipitor 40 mg daily   GERD On Protonix   Tobacco use Tobacco cessation counseled.   Anxiety and depression: Continue amitriptyline and Cymbalta.   Goals of care discussion: Prognosis is guarded to poor.  Remains full code.   Palliative care has already evaluated the patient during this hospitalization      DVT prophylaxis: Place TED hose Start: 09/06/21 1507 SCDs Start: 08/31/21 1058 Code Status: Full code Family Communication:    Status is: Inpatient Ongoing safe disposition planning.  Difficult to place care until she is able to tolerate hemodialysis in  chair.  Nephrology working with the patient     Subjective: No complaints.  Patient is tearful and  frustrated telling me that she is not able to sit up in the chair to be able to tolerate dialysis.   Examination:  General exam: Appears calm and comfortable  Respiratory system: Clear to auscultation. Respiratory effort normal. Cardiovascular system: S1 & S2 heard, RRR. No JVD, murmurs, rubs, gallops or clicks.  1+ bilateral lower extremity pitting edema Gastrointestinal system: Abdomen is nondistended, soft and nontender. No organomegaly or masses felt. Normal bowel sounds heard. Central nervous system: Alert and oriented. No focal neurological deficits. Extremities: Symmetric 5 x 5 power. Skin: No rashes, lesions or ulcers Psychiatry: Judgement and insight appear normal. Mood & affect appropriate.  Left upper extremity AV fistula External female catheter   Objective: Vitals:   12/24/21 0842 12/24/21 1744 12/24/21 2139 12/25/21 0602  BP: (!) 131/55 (!) 135/49 (!) 122/47 (!) 132/48  Pulse: 69 62 65 60  Resp: 18 19 13 13   Temp: 98.2 F (36.8 C) 98 F (36.7 C) 98.5 F (36.9 C) 98.2 F (36.8 C)  TempSrc: Oral Oral Oral Oral  SpO2: 100% 98% 98% 97%  Weight:      Height:        Intake/Output Summary (Last 24 hours) at 12/25/2021 0750 Last data filed at 12/24/2021 1700 Gross per 24 hour  Intake 980 ml  Output --  Net 980 ml   Filed Weights   12/20/21 1315 12/23/21 0831 12/23/21 1349  Weight: 88.2 kg 93.2 kg 89.7 kg     Data Reviewed:   CBC: Recent Labs  Lab 12/18/21 0811 12/23/21 0820  WBC 7.8 7.3  HGB 9.0* 9.3*  HCT 27.2* 28.2*  MCV 84.2 85.2  PLT 242 353   Basic Metabolic Panel: Recent Labs  Lab 12/18/21 0811 12/23/21 0820  NA 127* 127*  K 4.1 5.0  CL 87* 87*  CO2 23 26  GLUCOSE 162* 129*  BUN 82* 89*  CREATININE 6.60* 6.93*  CALCIUM 9.4 9.2  PHOS 6.7* 5.6*   GFR: Estimated Creatinine Clearance: 11.3 mL/min (A) (by C-G formula based on SCr of 6.93 mg/dL (H)). Liver Function Tests: Recent Labs  Lab 12/18/21 0811 12/23/21 0820  ALBUMIN 3.3*  3.6   No results for input(s): "LIPASE", "AMYLASE" in the last 168 hours. No results for input(s): "AMMONIA" in the last 168 hours. Coagulation Profile: No results for input(s): "INR", "PROTIME" in the last 168 hours. Cardiac Enzymes: No results for input(s): "CKTOTAL", "CKMB", "CKMBINDEX", "TROPONINI" in the last 168 hours. BNP (last 3 results) No results for input(s): "PROBNP" in the last 8760 hours. HbA1C: No results for input(s): "HGBA1C" in the last 72 hours. CBG: Recent Labs  Lab 12/23/21 2123 12/24/21 0719 12/24/21 1109 12/24/21 1617 12/25/21 0736  GLUCAP 223* 151* 174* 159* 147*   Lipid Profile: No results for input(s): "CHOL", "HDL", "LDLCALC", "TRIG", "CHOLHDL", "LDLDIRECT" in the last 72 hours. Thyroid Function Tests: No results for input(s): "TSH", "T4TOTAL", "FREET4", "T3FREE", "THYROIDAB" in the last 72 hours. Anemia Panel: No results for input(s): "VITAMINB12", "FOLATE", "FERRITIN", "TIBC", "IRON", "RETICCTPCT" in the last 72 hours. Sepsis Labs: No results for input(s): "PROCALCITON", "LATICACIDVEN" in the last 168 hours.  No results found for this or any previous visit (from the past 240 hour(s)).       Radiology Studies: No results found.      Scheduled Meds:  (feeding supplement) PROSource Plus  30 mL Oral BID BM   alosetron  1 mg Oral BID   amitriptyline  25 mg Oral QHS   atorvastatin  40 mg Oral q1800   carvedilol  6.25 mg Oral BID   Chlorhexidine Gluconate Cloth  6 each Topical Q0600   cyclobenzaprine  10 mg Oral TID   darbepoetin (ARANESP) injection - DIALYSIS  60 mcg Intravenous Q Fri-HD   docusate sodium  200 mg Oral BID   DULoxetine  60 mg Oral Daily   famotidine  20 mg Oral Daily   fentaNYL  1 patch Transdermal Q72H   furosemide  80 mg Oral Daily   insulin aspart  0-9 Units Subcutaneous TID WC   insulin aspart  4 Units Subcutaneous TID WC   insulin glargine-yfgn  7 Units Subcutaneous QHS   multivitamin  1 tablet Oral QHS    pantoprazole  80 mg Oral Daily   polyethylene glycol  17 g Oral Daily   sevelamer carbonate  2,400 mg Oral TID WC   sodium zirconium cyclosilicate  10 g Oral Once per day on Sun Tue Thu Sat   Continuous Infusions:  albumin human     methocarbamol (ROBAXIN) IV       LOS: 121 days   Time spent= 35 mins    Raenah Murley Arsenio Loader, MD [facility]  If 7PM-7AM, please contact night-coverage  12/25/2021, 7:50 AM

## 2021-12-26 DIAGNOSIS — M86252 Subacute osteomyelitis, left femur: Secondary | ICD-10-CM | POA: Diagnosis not present

## 2021-12-26 LAB — BASIC METABOLIC PANEL
Anion gap: 13 (ref 5–15)
BUN: 67 mg/dL — ABNORMAL HIGH (ref 6–20)
CO2: 26 mmol/L (ref 22–32)
Calcium: 9.5 mg/dL (ref 8.9–10.3)
Chloride: 87 mmol/L — ABNORMAL LOW (ref 98–111)
Creatinine, Ser: 6.62 mg/dL — ABNORMAL HIGH (ref 0.44–1.00)
GFR, Estimated: 7 mL/min — ABNORMAL LOW (ref >60)
Glucose, Bld: 163 mg/dL — ABNORMAL HIGH (ref 70–99)
Potassium: 4.4 mmol/L (ref 3.5–5.1)
Sodium: 126 mmol/L — ABNORMAL LOW (ref 135–145)

## 2021-12-26 LAB — CBC
HCT: 30.9 % — ABNORMAL LOW (ref 36.0–46.0)
Hemoglobin: 9.8 g/dL — ABNORMAL LOW (ref 12.0–15.0)
MCH: 27.7 pg (ref 26.0–34.0)
MCHC: 31.7 g/dL (ref 30.0–36.0)
MCV: 87.3 fL (ref 80.0–100.0)
Platelets: 225 10*3/uL (ref 150–400)
RBC: 3.54 MIL/uL — ABNORMAL LOW (ref 3.87–5.11)
RDW: 17.1 % — ABNORMAL HIGH (ref 11.5–15.5)
WBC: 7.4 10*3/uL (ref 4.0–10.5)
nRBC: 0 % (ref 0.0–0.2)

## 2021-12-26 LAB — MAGNESIUM
Magnesium: 1.9 mg/dL (ref 1.7–2.4)
Magnesium: 1.9 mg/dL (ref 1.7–2.4)

## 2021-12-26 LAB — GLUCOSE, CAPILLARY
Glucose-Capillary: 153 mg/dL — ABNORMAL HIGH (ref 70–99)
Glucose-Capillary: 165 mg/dL — ABNORMAL HIGH (ref 70–99)
Glucose-Capillary: 145 mg/dL — ABNORMAL HIGH (ref 70–99)
Glucose-Capillary: 130 mg/dL — ABNORMAL HIGH (ref 70–99)

## 2021-12-26 LAB — HEPATITIS B SURFACE ANTIBODY, QUANTITATIVE: Hep B S AB Quant (Post): <3.1 m[IU]/mL — ABNORMAL LOW (ref >9.9)

## 2021-12-26 MED ORDER — INSULIN GLARGINE-YFGN 100 UNIT/ML ~~LOC~~ SOLN
9.0000 [IU] | Freq: Every day | SUBCUTANEOUS | Status: DC
  Administered 2021-12-26 – 2022-01-07 (×13): 9 [IU] via SUBCUTANEOUS
  Filled 2021-12-26 (×14): qty 0.09

## 2021-12-26 MED ORDER — CARVEDILOL 3.125 MG PO TABS
3.1250 mg | ORAL_TABLET | Freq: Two times a day (BID) | ORAL | Status: DC
  Administered 2021-12-26 – 2022-02-25 (×109): 3.125 mg via ORAL
  Filled 2021-12-26 (×110): qty 1

## 2021-12-26 MED ORDER — DARBEPOETIN ALFA 60 MCG/0.3ML IJ SOSY
60.0000 ug | PREFILLED_SYRINGE | INTRAMUSCULAR | Status: DC
  Administered 2021-12-27: 60 ug via SUBCUTANEOUS
  Filled 2021-12-26: qty 0.3

## 2021-12-26 MED ORDER — CHLORHEXIDINE GLUCONATE CLOTH 2 % EX PADS: 6.0000 | MEDICATED_PAD | Freq: Every day | CUTANEOUS | Status: DC

## 2021-12-26 NOTE — Progress Notes (Signed)
PROGRESS NOTE    [name]  ZOX:[id number] DOB: 02/20/1966 DOA: 08/26/2021 PCP: [name], MD   Brief Narrative:   56 year old female with history of hypertension, hyperlipidemia, diabetes mellitus type 2, end-stage renal disease on hemodialysis, left BKA for chronic calcaneus osteomyelitis on 01/12/2020, bipolar disorder, left distal femoral fracture after a fall on 07/19/2021 deemed  nonoperative, admission from 08/03/2021-08/23/2021 for left humeral osteomyelitis requiring partial resection of femur and rearrangements of bony fragments with subsequent antibiotic treatment presented with left hip bleeding and was found to have subacute osteomyelitis and abscess of left femur.  Orthopedic/ID were consulted.  She was started on broad-spectrum antibiotics. She underwent multiple I&D's by Dr. [name] along with local tissue rearrangement. She was also seen by vascular surgery who did not fistula branch ligation.  She has completed 6-week course of daptomycin on 10/09/2021.  Discharge is pending ability for patient to tolerate hemodialysis in a chair.     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  Active Problems:   ESRD (end stage renal disease) on dialysis ([place])   Type 2 diabetes mellitus with hyperlipidemia (HCC)   History of CVA (cerebrovascular accident)   Essential hypertension   Anxiety and depression   GASTROESOPHAGEAL REFLUX, NO ESOPHAGITIS   TOBACCO DEPENDENCE   Hyponatremia   Obesity (BMI 30.0-34.9)   Unilateral amputation of left foot (HCC)   Wound infection   History of CVA (cerebrovascular accident) without residual deficits   Hyperkalemia   Wound dehiscence, surgical, sequela   Subacute osteomyelitis of left femur with abscess: -Status post surgical intervention by Dr. [name].  Completed 6 weeks of daptomycin on 8/9 and was supposed to follow-up outpatient with Dr. [name] in 1 week thereafter.  Reimaging performed on 10/2 showed  healing fractures with callus.  Continue pain management at this time. She has completed 6-week course of daptomycin on 10/09/2021.     Right thigh mild cellulitis: Completed 5-day course of antibiotics on 12/18/2021.   Symptoms most likely from anasarca. Improving   History of left intertrochanteric fracture: Left hip hardware removed on 08/10/2021 prior to admission. Continue adequate pain control.   End-stage renal disease on hemodialysis: She has generalized anasarca.  Nephrology following Tells me she is not strong enough to sit in the chair for dialysis   Hyponatremia/hyperphosphatemia Management with hemodialysis On sevelamer   Anemia of chronic disease H&H currently stable.  Monitor intermittently   Type 2 diabetes with hyperglycemia Continue long-acting insulin increase 9 units, NovoLog with meals along with CBGs with SSI.  Off-and-on has hyperglycemia, eating junk food brought in by family.  I have advised nursing staff to restrict this and she needs to follow strict diet in the hospital.   Essential hypertension: On Coreg, daily Lasix.  IV as needed ordered   History of unspecified CVA Hyperlipidemia Continue Lipitor 40 mg daily   GERD On Protonix   Tobacco use Tobacco cessation counseled.   Anxiety and depression: Continue amitriptyline and Cymbalta.   Goals of care discussion: Prognosis is guarded to poor.  Remains full code.   Palliative care has already evaluated the patient during this hospitalization      DVT prophylaxis: Place TED hose Start: 09/06/21 1507 SCDs Start: 08/31/21 1058 Code Status: Full code Family Communication:    Status is: Inpatient Ongoing safe disposition planning.  Difficult to place care until she is able to tolerate hemodialysis in chair.  Nephrology working with the patient Patient needs to  follow strict diet in the hospital    Subjective: No complaints this morning.  Tells me her left hip hurts and does not want to sit  in the chair but will try later with physical therapy   Examination:  Constitutional: Not in acute distress Respiratory: Clear to auscultation bilaterally Cardiovascular: Normal sinus rhythm, no rubs Abdomen: Nontender nondistended good bowel sounds Musculoskeletal: 1+ bilateral lower extremity pitting edema Skin: No rashes seen Neurologic: CN 2-12 grossly intact.  And nonfocal Psychiatric: Normal judgment and insight. Alert and oriented x 3. Normal mood. Left upper extremity AV fistula External female catheter   Objective: Vitals:   12/25/21 1650 12/25/21 2027 12/26/21 0614 12/26/21 0829  BP: (!) 116/47 134/63 128/76 129/60  Pulse: 67 65 73 (!) 57  Resp: 17 18 18 17   Temp: 98.2 F (36.8 C) 98.3 F (36.8 C) 98.5 F (36.9 C) 98.1 F (36.7 C)  TempSrc: Oral Oral Oral Oral  SpO2: 98% 98% 100% 99%  Weight:      Height:        Intake/Output Summary (Last 24 hours) at 12/26/2021 1020 Last data filed at 12/26/2021 0800 Gross per 24 hour  Intake 780 ml  Output 4000 ml  Net -3220 ml   Filed Weights   12/23/21 0831 12/23/21 1349 12/25/21 1418  Weight: 93.2 kg 89.7 kg 90.5 kg     Data Reviewed:   CBC: Recent Labs  Lab 12/23/21 0820 12/25/21 0852 12/26/21 0801  WBC 7.3 8.1 7.4  HGB 9.3* 9.1* 9.8*  HCT 28.2* 27.3* 30.9*  MCV 85.2 86.1 87.3  PLT 233 224 154   Basic Metabolic Panel: Recent Labs  Lab 12/23/21 0820 12/25/21 0852 12/26/21 0458 12/26/21 0801  NA 127* 128*  --  126*  K 5.0 4.4  --  4.4  CL 87* 84*  --  87*  CO2 26 27  --  26  GLUCOSE 129* 204*  --  163*  BUN 89* 70*  --  67*  CREATININE 6.93* 6.48*  --  6.62*  CALCIUM 9.2 9.4  --  9.5  MG  --   --  1.9 1.9  PHOS 5.6* 5.6*  --   --    GFR: Estimated Creatinine Clearance: 11.9 mL/min (A) (by C-G formula based on SCr of 6.62 mg/dL (H)). Liver Function Tests: Recent Labs  Lab 12/23/21 0820 12/25/21 0852  ALBUMIN 3.6 3.4*   No results for input(s): "LIPASE", "AMYLASE" in the last 168  hours. No results for input(s): "AMMONIA" in the last 168 hours. Coagulation Profile: No results for input(s): "INR", "PROTIME" in the last 168 hours. Cardiac Enzymes: No results for input(s): "CKTOTAL", "CKMB", "CKMBINDEX", "TROPONINI" in the last 168 hours. BNP (last 3 results) No results for input(s): "PROBNP" in the last 8760 hours. HbA1C: No results for input(s): "HGBA1C" in the last 72 hours. CBG: Recent Labs  Lab 12/25/21 0736 12/25/21 1448 12/25/21 1640 12/25/21 2027 12/26/21 0715  GLUCAP 147* 186* 189* 237* 153*   Lipid Profile: No results for input(s): "CHOL", "HDL", "LDLCALC", "TRIG", "CHOLHDL", "LDLDIRECT" in the last 72 hours. Thyroid Function Tests: No results for input(s): "TSH", "T4TOTAL", "FREET4", "T3FREE", "THYROIDAB" in the last 72 hours. Anemia Panel: No results for input(s): "VITAMINB12", "FOLATE", "FERRITIN", "TIBC", "IRON", "RETICCTPCT" in the last 72 hours. Sepsis Labs: No results for input(s): "PROCALCITON", "LATICACIDVEN" in the last 168 hours.  No results found for this or any previous visit (from the past 240 hour(s)).       Radiology Studies: No  results found.      Scheduled Meds:  (feeding supplement) PROSource Plus  30 mL Oral BID BM   alosetron  1 mg Oral BID   amitriptyline  25 mg Oral QHS   atorvastatin  40 mg Oral q1800   carvedilol  6.25 mg Oral BID   Chlorhexidine Gluconate Cloth  6 each Topical Q0600   cyclobenzaprine  10 mg Oral TID   darbepoetin (ARANESP) injection - DIALYSIS  60 mcg Intravenous Q Fri-HD   docusate sodium  200 mg Oral BID   DULoxetine  60 mg Oral Daily   famotidine  20 mg Oral Daily   fentaNYL  1 patch Transdermal Q72H   furosemide  80 mg Oral Daily   insulin aspart  0-9 Units Subcutaneous TID WC   insulin aspart  4 Units Subcutaneous TID WC   insulin glargine-yfgn  9 Units Subcutaneous QHS   multivitamin  1 tablet Oral QHS   pantoprazole  80 mg Oral Daily   polyethylene glycol  17 g Oral Daily    sevelamer carbonate  2,400 mg Oral TID WC   sodium zirconium cyclosilicate  10 g Oral Once per day on Sun Tue Thu Sat   Continuous Infusions:  albumin human     methocarbamol (ROBAXIN) IV       LOS: 122 days   Time spent= 35 mins    Mariane Burpee Arsenio Loader, MD [facility]  If 7PM-7AM, please contact night-coverage  12/26/2021, 10:20 AM

## 2021-12-26 NOTE — Progress Notes (Addendum)
[facility] Progress Note   Subjective:   Patient seen and examined at bedside.  Only new complaint is her breakfast did not come this AM.  Denies CP, SOB, abdominal pain and n/v/d.  Continues to have LE pain.  Edema improved today.   Objective Vitals:   12/25/21 1650 12/25/21 2027 12/26/21 0614 12/26/21 0829  BP: (!) 116/47 134/63 128/76 129/60  Pulse: 67 65 73 (!) 57  Resp: 17 18 18 17   Temp: 98.2 F (36.8 C) 98.3 F (36.8 C) 98.5 F (36.9 C) 98.1 F (36.7 C)  TempSrc: Oral Oral Oral Oral  SpO2: 98% 98% 100% 99%  Weight:      Height:       Physical Exam General:WDWN female in NAD Heart:RRR, no mrg Lungs:CTAB, nml WOB on RA Abdomen:soft, NTND, +flank edema Extremities: L BKA, 1+ edema on R Dialysis Access: LU AVF +b/t   Filed Weights   12/23/21 0831 12/23/21 1349 12/25/21 1418  Weight: 93.2 kg 89.7 kg 90.5 kg    Intake/Output Summary (Last 24 hours) at 12/26/2021 1148 Last data filed at 12/26/2021 0800 Gross per 24 hour  Intake 780 ml  Output 4000 ml  Net -3220 ml    Additional Objective Labs: Basic Metabolic Panel: Recent Labs  Lab 12/23/21 0820 12/25/21 0852 12/26/21 0801  NA 127* 128* 126*  K 5.0 4.4 4.4  CL 87* 84* 87*  CO2 26 27 26   GLUCOSE 129* 204* 163*  BUN 89* 70* 67*  CREATININE 6.93* 6.48* 6.62*  CALCIUM 9.2 9.4 9.5  PHOS 5.6* 5.6*  --    Liver Function Tests: Recent Labs  Lab 12/23/21 0820 12/25/21 0852  ALBUMIN 3.6 3.4*   CBC: Recent Labs  Lab 12/23/21 0820 12/25/21 0852 12/26/21 0801  WBC 7.3 8.1 7.4  HGB 9.3* 9.1* 9.8*  HCT 28.2* 27.3* 30.9*  MCV 85.2 86.1 87.3  PLT 233 224 225   CBG: Recent Labs  Lab 12/25/21 1448 12/25/21 1640 12/25/21 2027 12/26/21 0715 12/26/21 1134  GLUCAP 186* 189* 237* 153* 165*    Medications:  albumin human     methocarbamol (ROBAXIN) IV      (feeding supplement) PROSource Plus  30 mL Oral BID BM   alosetron  1 mg Oral BID   amitriptyline  25 mg Oral QHS    atorvastatin  40 mg Oral q1800   carvedilol  6.25 mg Oral BID   Chlorhexidine Gluconate Cloth  6 each Topical Q0600   cyclobenzaprine  10 mg Oral TID   darbepoetin (ARANESP) injection - DIALYSIS  60 mcg Intravenous Q Fri-HD   docusate sodium  200 mg Oral BID   DULoxetine  60 mg Oral Daily   famotidine  20 mg Oral Daily   fentaNYL  1 patch Transdermal Q72H   furosemide  80 mg Oral Daily   insulin aspart  0-9 Units Subcutaneous TID WC   insulin aspart  4 Units Subcutaneous TID WC   insulin glargine-yfgn  9 Units Subcutaneous QHS   multivitamin  1 tablet Oral QHS   pantoprazole  80 mg Oral Daily   polyethylene glycol  17 g Oral Daily   sevelamer carbonate  2,400 mg Oral TID WC   sodium zirconium cyclosilicate  10 g Oral Once per day on Sun Tue Thu Sat    Dialysis Orders: MWF at [facility], 400/500, EDW 90kg, 2K/2Ca, LUE AVF - heparin 4000 unit IV bolus - No ESA   Assessment/Plan:    Recurrent L  hip osteomyelitis: S/p 08/10/21 surgery (removal hip IMN which was placed 12/2019) with persistent VRE infection. Back to OR 08/28/21, 08/31/21 for L hip debridement /wound vac  (now removed).  Completed 6 wk course of IV Daptomycin on 10/12/21. Management per primary team.  Chronic L hip/residual limb pain: She does have fractures of L femoral neck (where hardware was removed) as well as distal femur Fx. Pain control per primary.  ESRD: MWF HD on schedule.  HD tomorrow per regular schedule. Clotting during treatment, add 2000 unit mid run dose heparin + 4000 unit heparin bolus. 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 Virl Cagey. AVF working well now. HTN/volume: BP stable on Coreg 6.25 mg BID (hold a.m. of HD) - d/c hydralazine 10/24.  Lower Coreg to 3.125mg  BID on 10/25.  Continue to attempt taper DOWN BP meds with UF on HD to prevent cramping.  Ongoing LE and flank edema noted. Hx large gains. Stressed fluid and sodium  restrictions.  Continue UF as tolerated.  New EDW since admitted in the works.   Anemia of ESRD: last Hgb 9.8 tsat 54%.  Aranesp increased to 60q wk starting 10/20. Secondary HPTH: CorrCa slightly high, not on VDRA. Phos variable, usually 5-6. Continue Renvela as binder. Nutrition: Alb low, continue protein supplements.  Renal diet w/fluid restrictions.  DM2: Insulin per primary Dispo: SNF previously recommended, now she would like to go home on discharge even though HD unit has been clear that they cannot assist patient out of her car and into the facility. Patient is aware of this.  Not home HD candidate.  Dialyzed in recliner on 9/4 but signed off early due to pain, has declined to come in a recliner since then.Strongly discussed on the importance of increasing her mobility as much as possible while she is in her room in addition to following her ongoing fluid restriction. Also reinforced the importance of being in the chair for HD treatments to improve strength and mobility. Our goal is to dialyze her in the mornings so the patient has more time to work with PT in the afternoons (we will do our best to accommodate this as we still need to always consider the emergent cases).  Encouraged ongoing progress. Per Renal Navigator notes there are no clinics that will accept patient unless she will have HD in chair. Will continue to encourage use of chair for HD  Jen Mow, PA-C [place] 12/26/2021,11:48 AM  LOS: 122 days    Seen and examined independently.  Agree with note and exam as documented above by physician extender and as noted here.  She states that trying dialysis in a chair is not her priority right now.  She's angry with me for discussing this with her.  She has been here 122 days.  She states that she did dialysis in a chair before she came in and will do it when she's discharged at her clinic.  I let her know that she doesn't have an accepting clinic right now  because she hasn't agreed to dialyze in a chair.    General adult female in bed in no acute distress HEENT normocephalic atraumatic extraocular movements intact sclera anicteric Neck supple trachea midline Lungs clear to auscultation bilaterally normal work of breathing at rest  Heart S1S2 no rub Abdomen soft nontender nondistended Extremities no edema  Psych frustrated  Access LUE AVF bruit and thrill   Osteomyelitis - s/p abx  per primary team   ESRD - HD per MWF schedule.  We have encouraged her to try HD in a chair   Hyponatremia - previously discussed that she is on amtriptyline and cymbalta which can contribute.  She wants me to leave as I have discussed again with her about the need to try to do dialysis in a chair.  Will continue to assess.  Optimizing volume status with HD  HTN optimize volume with HD   Dispo - as above she as not agreed to attempt dialysis in a chair recently and has no accepting outpatient unit.  I have discussed a palliative care consult with her and she was frustrated that I brought this up.  We are not currently able to dialyze her as an outpatient.     Claudia Desanctis, MD 12/26/2021 2:42 PM

## 2021-12-26 NOTE — Progress Notes (Signed)
Physical Therapy Treatment Patient Details Name: [name] MRN: [id number] DOB: Aug 11, 1965 Today's Date: 12/26/2021   History of Present Illness 56 y.o. female presented to ED 6/26 from dialysis with increased bloody drainage from L hip wound. 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 s/p hardware removal of left hip with partial resection of tuft tissue and femure that were nonviable on 08/10/21 Discharged home 6/23. underwent L hip debridement and wound vac placement 6/28; +Left intertrochanteric non-union,  Fracture of left distal femur  PMH: hypertension, hyperlipidemia, ESRD on HD MWF, history of left BKA in 2021, depression/anxiety, stroke, tobacco use, T2DM,  insomnia, chronic pain syndrome,    PT Comments    Patient frustrated on arrival but agreeable to participate in limited therapy session. Declined getting into recliner this date. Performed passive stretching of L knee extension and hip internal rotation x 5 for 30 seconds each. Able to laterally scoot on EOB but requiring more assistance due to friction of sheets and bed pad. Educated on head hips relationship but patient unable to problem solve to complete despite demonstration. Continue to recommend SNF for ongoing Physical Therapy.       Recommendations for follow up therapy are one component of a multi-disciplinary discharge planning process, led by the attending physician.  Recommendations may be updated based on patient status, additional functional criteria and insurance authorization.  Follow Up Recommendations  Skilled nursing-short term rehab (<3 hours/day) (pt currently refusing due to inability to tolerate sitting in chair for 6+ hours as transported to/from dialysis) Can patient physically be transported by private vehicle: No   Assistance Recommended at Discharge Intermittent Supervision/Assistance  Patient can return home with the following A lot of help with  bathing/dressing/bathroom;Assistance with cooking/housework;Assist for transportation;Two people to help with walking and/or transfers;Help with stairs or ramp for entrance   Equipment Recommendations  None recommended by PT    Recommendations for Other Services       Precautions / Restrictions Precautions Precautions: Fall;Other (comment) Precaution Comments: L BKA (baseline) Restrictions Weight Bearing Restrictions: No     Mobility  Bed Mobility Overal bed mobility: Needs Assistance Bed Mobility: Supine to Sit, Sit to Supine     Supine to sit: Modified independent (Device/Increase time) Sit to supine: Modified independent (Device/Increase time)   General bed mobility comments: HOB elevated. No cues provided to see patient's problem solving. Able to recall pushing through R hand    Transfers Overall transfer level: Needs assistance Equipment used: None Transfers: Bed to chair/wheelchair/BSC            Lateral/Scoot Transfers: Min assist General transfer comment: declined getting into chair this date. Agreeable to EOB lateral scooting L/R but requires increased assist on cotton sheets due to friction. Difficulty clearing buttocks from bed to scoot. Educated on head hips relationship but patient with difficulty problem solving use despite demonstration    Ambulation/Gait                   Stairs             Wheelchair Mobility    Modified Rankin (Stroke Patients Only)       Balance Overall balance assessment: Needs assistance                                          Cognition Arousal/Alertness: Awake/alert Behavior During Therapy: WFL for  tasks assessed/performed Overall Cognitive Status: Impaired/Different from baseline Area of Impairment: Awareness                           Awareness: Emergent            Exercises Other Exercises Other Exercises: L knee passive stretch to extension and internal  rotation x 5 for 30 seconds    General Comments        Pertinent Vitals/Pain Pain Assessment Pain Assessment: Faces Faces Pain Scale: Hurts even more Pain Location: left hip primarily Pain Descriptors / Indicators: Discomfort, Grimacing, Guarding Pain Intervention(s): Monitored during session    Home Living                          Prior Function            PT Goals (current goals can now be found in the care plan section) Acute Rehab PT Goals PT Goal Formulation: With patient Time For Goal Achievement: 01/01/22 Potential to Achieve Goals: Fair Progress towards PT goals: Progressing toward goals    Frequency    Min 2X/week      PT Plan Current plan remains appropriate    Co-evaluation              AM-PAC PT "6 Clicks" Mobility   Outcome Measure  Help needed turning from your back to your side while in a flat bed without using bedrails?: None Help needed moving from lying on your back to sitting on the side of a flat bed without using bedrails?: None Help needed moving to and from a bed to a chair (including a wheelchair)?: A Little Help needed standing up from a chair using your arms (e.g., wheelchair or bedside chair)?: Total Help needed to walk in hospital room?: Total Help needed climbing 3-5 steps with a railing? : Total 6 Click Score: 14    End of Session   Activity Tolerance: Patient limited by fatigue Patient left: in bed;with call bell/phone within reach Nurse Communication: Mobility status PT Visit Diagnosis: Other abnormalities of gait and mobility (R26.89);Muscle weakness (generalized) (M62.81);History of falling (Z91.81);Pain Pain - Right/Left: Left Pain - part of body: Leg     Time: 0263-7858 PT Time Calculation (min) (ACUTE ONLY): 33 min  Charges:  $Therapeutic Exercise: 8-22 mins $Therapeutic Activity: 8-22 mins                     Kizzie Cotten A. Gilford Rile PT, DPT Acute Rehabilitation Services Office  [phone]    [name] 12/26/2021, 4:47 PM

## 2021-12-26 NOTE — Progress Notes (Signed)
Orthopedic Tech Progress Note Patient Details:  [name] 13-Jan-1966 [id number] Patients leg is not sustainable for bone foam. Patient has no ROM And will not be able to use device. Informed RN to reach out to MD to advise therapy or another device.  Patient ID: [name], female   DOB: 01-Mar-1966, 56 y.o.   MRN: [id number]  [name] 12/26/2021, 3:37 PM

## 2021-12-27 DIAGNOSIS — M86252 Subacute osteomyelitis, left femur: Secondary | ICD-10-CM | POA: Diagnosis not present

## 2021-12-27 LAB — CBC
HCT: 27.7 % — ABNORMAL LOW (ref 36.0–46.0)
Hemoglobin: 9.4 g/dL — ABNORMAL LOW (ref 12.0–15.0)
MCH: 28.5 pg (ref 26.0–34.0)
MCHC: 33.9 g/dL (ref 30.0–36.0)
MCV: 83.9 fL (ref 80.0–100.0)
Platelets: 229 10*3/uL (ref 150–400)
RBC: 3.3 MIL/uL — ABNORMAL LOW (ref 3.87–5.11)
RDW: 16.9 % — ABNORMAL HIGH (ref 11.5–15.5)
WBC: 6.7 10*3/uL (ref 4.0–10.5)
nRBC: 0 % (ref 0.0–0.2)

## 2021-12-27 LAB — GLUCOSE, CAPILLARY
Glucose-Capillary: 166 mg/dL — ABNORMAL HIGH (ref 70–99)
Glucose-Capillary: 171 mg/dL — ABNORMAL HIGH (ref 70–99)
Glucose-Capillary: 209 mg/dL — ABNORMAL HIGH (ref 70–99)

## 2021-12-27 MED ORDER — HEPARIN SODIUM (PORCINE) 1000 UNIT/ML IJ SOLN
INTRAMUSCULAR | Status: AC
  Filled 2021-12-27: qty 4

## 2021-12-27 MED ORDER — HEPARIN SODIUM (PORCINE) 1000 UNIT/ML DIALYSIS: 1000.0000 [IU] | INTRAMUSCULAR | Status: DC | PRN

## 2021-12-27 MED ORDER — ALTEPLASE 2 MG IJ SOLR: 2.0000 mg | Freq: Once | INTRAMUSCULAR | Status: DC | PRN

## 2021-12-27 MED ORDER — ANTICOAGULANT SODIUM CITRATE 4% (200MG/5ML) IV SOLN: 5.0000 mL | Status: DC | PRN

## 2021-12-27 MED ORDER — HEPARIN SODIUM (PORCINE) 1000 UNIT/ML DIALYSIS
4000.0000 [IU] | INTRAMUSCULAR | Status: DC | PRN
  Administered 2021-12-27: 4000 [IU] via INTRAVENOUS_CENTRAL

## 2021-12-27 MED ORDER — PENTAFLUOROPROP-TETRAFLUOROETH EX AERO: 1.0000 | INHALATION_SPRAY | CUTANEOUS | Status: DC | PRN

## 2021-12-27 NOTE — Progress Notes (Signed)
PROGRESS NOTE    [name]  EGB:[id number] DOB: January 26, 1966 DOA: 08/26/2021 PCP: [name], MD   Brief Narrative:   56 year old female with history of hypertension, hyperlipidemia, diabetes mellitus type 2, end-stage renal disease on hemodialysis, left BKA for chronic calcaneus osteomyelitis on 01/12/2020, bipolar disorder, left distal femoral fracture after a fall on 07/19/2021 deemed  nonoperative, admission from 08/03/2021-08/23/2021 for left humeral osteomyelitis requiring partial resection of femur and rearrangements of bony fragments with subsequent antibiotic treatment presented with left hip bleeding and was found to have subacute osteomyelitis and abscess of left femur.  Orthopedic/ID were consulted.  She was started on broad-spectrum antibiotics. She underwent multiple I&D's by Dr. [name] along with local tissue rearrangement. She was also seen by vascular surgery who did not fistula branch ligation.  She has completed 6-week course of daptomycin on 10/09/2021.  Discharge is pending ability for patient to tolerate hemodialysis in a chair.     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  Active Problems:   ESRD (end stage renal disease) on dialysis ([place])   Type 2 diabetes mellitus with hyperlipidemia (HCC)   History of CVA (cerebrovascular accident)   Essential hypertension   Anxiety and depression   GASTROESOPHAGEAL REFLUX, NO ESOPHAGITIS   TOBACCO DEPENDENCE   Hyponatremia   Obesity (BMI 30.0-34.9)   Unilateral amputation of left foot (HCC)   Wound infection   History of CVA (cerebrovascular accident) without residual deficits   Hyperkalemia   Wound dehiscence, surgical, sequela   Subacute osteomyelitis of left femur with abscess: -Status post surgical intervention by Dr. [name].  Completed 6 weeks of daptomycin on 8/9 and was supposed to follow-up outpatient with Dr. [name] in 1 week thereafter.  Reimaging performed on 10/2 showed  healing fractures with callus.  Continue pain management at this time.  I will reach out to Dr. [name] again to see if they have any other suggestions at this point as left hip pain is complicating her dispo She has completed 6-week course of daptomycin on 10/09/2021.     Right thigh mild cellulitis: Completed 5-day course of antibiotics on 12/18/2021.   Symptoms most likely from anasarca. Improving   History of left intertrochanteric fracture: Left hip hardware removed on 08/10/2021 prior to admission. Continue adequate pain control.   End-stage renal disease on hemodialysis: She has generalized anasarca.  Nephrology following Tells me she is not strong enough to sit in the chair for dialysis   Hyponatremia/hyperphosphatemia Management with hemodialysis On sevelamer   Anemia of chronic disease H&H currently stable.  Monitor intermittently   Type 2 diabetes with hyperglycemia Continue long-acting insulin increase 9 units, NovoLog with meals along with CBGs with SSI.  Off-and-on has hyperglycemia, eating junk food brought in by family.  I have advised nursing staff to restrict this and she needs to follow strict diet in the hospital.   Essential hypertension: On Coreg, daily Lasix.  IV as needed ordered   History of unspecified CVA Hyperlipidemia Continue Lipitor 40 mg daily   GERD On Protonix   Tobacco use Tobacco cessation counseled.   Anxiety and depression: Continue amitriptyline and Cymbalta.   Goals of care discussion: Prognosis is guarded to poor.  Remains full code.   Palliative care has already evaluated the patient during this hospitalization      DVT prophylaxis: Place TED hose Start: 09/06/21 1507 SCDs Start: 08/31/21 1058 Code Status: Full code Family Communication:    Status is:  Inpatient Ongoing safe disposition planning.  Difficult to place care until she is able to tolerate hemodialysis in chair.  Nephrology working with the patient Patient needs to  follow strict diet in the hospital    Subjective: Seen in hemodialysis, unable to sit in the chair due to left hip pain.  Denies any other complaints  Examination:  Constitutional: Not in acute distress Respiratory: Clear to auscultation bilaterally Cardiovascular: Normal sinus rhythm, no rubs Abdomen: Nontender nondistended good bowel sounds Musculoskeletal: No edema noted Skin: No rashes seen Neurologic: CN 2-12 grossly intact.  And nonfocal Psychiatric: Normal judgment and insight. Alert and oriented x 3. Normal mood. Left upper extremity AV fistula External female catheter   Objective: Vitals:   12/27/21 1030 12/27/21 1100 12/27/21 1130 12/27/21 1144  BP: 128/63 120/65 118/63 136/61  Pulse: 66 67 69 73  Resp: 12 (!) 9 11 17   Temp:    98.3 F (36.8 C)  TempSrc:    Oral  SpO2: 100% 100% 100% 100%  Weight:    91 kg  Height:        Intake/Output Summary (Last 24 hours) at 12/27/2021 1214 Last data filed at 12/27/2021 1144 Gross per 24 hour  Intake 120 ml  Output 4000 ml  Net -3880 ml   Filed Weights   12/25/21 1418 12/27/21 0721 12/27/21 1144  Weight: 90.5 kg 94.2 kg 91 kg     Data Reviewed:   CBC: Recent Labs  Lab 12/23/21 0820 12/25/21 0852 12/26/21 0801 12/27/21 0750  WBC 7.3 8.1 7.4 6.7  HGB 9.3* 9.1* 9.8* 9.4*  HCT 28.2* 27.3* 30.9* 27.7*  MCV 85.2 86.1 87.3 83.9  PLT 233 224 225 408   Basic Metabolic Panel: Recent Labs  Lab 12/23/21 0820 12/25/21 0852 12/26/21 0458 12/26/21 0801  NA 127* 128*  --  126*  K 5.0 4.4  --  4.4  CL 87* 84*  --  87*  CO2 26 27  --  26  GLUCOSE 129* 204*  --  163*  BUN 89* 70*  --  67*  CREATININE 6.93* 6.48*  --  6.62*  CALCIUM 9.2 9.4  --  9.5  MG  --   --  1.9 1.9  PHOS 5.6* 5.6*  --   --    GFR: Estimated Creatinine Clearance: 11.9 mL/min (A) (by C-G formula based on SCr of 6.62 mg/dL (H)). Liver Function Tests: Recent Labs  Lab 12/23/21 0820 12/25/21 0852  ALBUMIN 3.6 3.4*   No results for  input(s): "LIPASE", "AMYLASE" in the last 168 hours. No results for input(s): "AMMONIA" in the last 168 hours. Coagulation Profile: No results for input(s): "INR", "PROTIME" in the last 168 hours. Cardiac Enzymes: No results for input(s): "CKTOTAL", "CKMB", "CKMBINDEX", "TROPONINI" in the last 168 hours. BNP (last 3 results) No results for input(s): "PROBNP" in the last 8760 hours. HbA1C: No results for input(s): "HGBA1C" in the last 72 hours. CBG: Recent Labs  Lab 12/25/21 2027 12/26/21 0715 12/26/21 1134 12/26/21 1654 12/26/21 2033  GLUCAP 237* 153* 165* 145* 130*   Lipid Profile: No results for input(s): "CHOL", "HDL", "LDLCALC", "TRIG", "CHOLHDL", "LDLDIRECT" in the last 72 hours. Thyroid Function Tests: No results for input(s): "TSH", "T4TOTAL", "FREET4", "T3FREE", "THYROIDAB" in the last 72 hours. Anemia Panel: No results for input(s): "VITAMINB12", "FOLATE", "FERRITIN", "TIBC", "IRON", "RETICCTPCT" in the last 72 hours. Sepsis Labs: No results for input(s): "PROCALCITON", "LATICACIDVEN" in the last 168 hours.  No results found for this or any previous visit (  from the past 240 hour(s)).       Radiology Studies: No results found.      Scheduled Meds:  (feeding supplement) PROSource Plus  30 mL Oral BID BM   alosetron  1 mg Oral BID   amitriptyline  25 mg Oral QHS   atorvastatin  40 mg Oral q1800   carvedilol  3.125 mg Oral BID   Chlorhexidine Gluconate Cloth  6 each Topical Q0600   cyclobenzaprine  10 mg Oral TID   darbepoetin (ARANESP) injection - DIALYSIS  60 mcg Subcutaneous Q Fri-1800   docusate sodium  200 mg Oral BID   DULoxetine  60 mg Oral Daily   famotidine  20 mg Oral Daily   fentaNYL  1 patch Transdermal Q72H   furosemide  80 mg Oral Daily   heparin sodium (porcine)       insulin aspart  0-9 Units Subcutaneous TID WC   insulin aspart  4 Units Subcutaneous TID WC   insulin glargine-yfgn  9 Units Subcutaneous QHS   multivitamin  1 tablet Oral  QHS   pantoprazole  80 mg Oral Daily   polyethylene glycol  17 g Oral Daily   sevelamer carbonate  2,400 mg Oral TID WC   sodium zirconium cyclosilicate  10 g Oral Once per day on Sun Tue Thu Sat   Continuous Infusions:  albumin human     methocarbamol (ROBAXIN) IV       LOS: 123 days   Time spent= 35 mins    Donna Koble Arsenio Loader, MD [facility]  If 7PM-7AM, please contact night-coverage  12/27/2021, 12:14 PM

## 2021-12-27 NOTE — Progress Notes (Signed)
Received patient in bed to unit.  Alert and oriented.  Informed consent signed and in chart.   Treatment initiated: 0740 Treatment completed: 8208  Patient tolerated well.  Transported back to the room  Alert, without acute distress.  Hand-off given to patient's nurse.   Access used: L AVF Access issues: None  Total UF removed: 4076ml Medication(s) given: Heparin 4000units bolus X1 Post HD VS: 98.3, 136/61, 73, 17, 100% on RA Post HD weight: 91kg   [facility]

## 2021-12-27 NOTE — TOC Progression Note (Signed)
Transition of Care [facility]) - Initial/Assessment Note    Patient Details  Name: [name] MRN: [id number] Date of Birth: 1965/06/16  Transition of Care [facility]) CM/SW Contact:    Milinda Antis, [place] Phone Number: 12/27/2021, 2:32 PM  Clinical Narrative:                 Patient still reports being unable to sit for dialysis treatment.  This is a barrier to any discharge location as outpatient dialysis require that patients sit in a chair to receive dialysis.    TOC will continue to follow.  Expected Discharge Plan: Skilled Nursing Facility Barriers to Discharge: Continued Medical Work up   Patient Goals and CMS Choice Patient states their goals for this hospitalization and ongoing recovery are:: To return home CMS Medicare.gov Compare Post Acute Care list provided to:: Patient Choice offered to / list presented to : NA  Expected Discharge Plan and Services Expected Discharge Plan: [place]   Discharge Planning Services: CM Consult   Living arrangements for the past 2 months: Single Family Home                                      Prior Living Arrangements/Services Living arrangements for the past 2 months: Single Family Home Lives with:: Adult Children Patient language and need for interpreter reviewed:: Yes Do you feel safe going back to the place where you live?: Yes      Need for Family Participation in Patient Care: Yes (Comment) Care giver support system in place?: Yes (comment) Current home services: DME ([employer], w/c) Criminal Activity/Legal Involvement Pertinent to Current Situation/Hospitalization: No - Comment as needed  Activities of Daily Living Home Assistive Devices/Equipment: Eyeglasses, Wheelchair ADL Screening (condition at time of admission) Patient's cognitive ability adequate to safely complete daily activities?: Yes Is the patient deaf or have difficulty hearing?: No Does the patient have difficulty seeing, even when  wearing glasses/contacts?: No Does the patient have difficulty concentrating, remembering, or making decisions?: No Patient able to express need for assistance with ADLs?: Yes Does the patient have difficulty dressing or bathing?: Yes Independently performs ADLs?: No Communication: Independent Dressing (OT): Independent Is this a change from baseline?: Pre-admission baseline Grooming: Independent Feeding: Independent Bathing: Needs assistance Is this a change from baseline?: Pre-admission baseline Toileting: Needs assistance Is this a change from baseline?: Pre-admission baseline In/Out Bed: Needs assistance, Dependent Is this a change from baseline?: Pre-admission baseline Walks in Home: Dependent Is this a change from baseline?: Pre-admission baseline Does the patient have difficulty walking or climbing stairs?: Yes Weakness of Legs: Both Weakness of Arms/Hands: Both  Permission Sought/Granted Permission sought to share information with : Case Manager, Family Supports Permission granted to share information with : Yes, Verbal Permission Granted              Emotional Assessment Appearance:: Appears stated age Attitude/Demeanor/Rapport: Engaged, Gracious Affect (typically observed): Accepting, Appropriate, Calm, Hopeful Orientation: : Oriented to Self, Oriented to Place, Oriented to  Time, Oriented to Situation Alcohol / Substance Use: Not Applicable Psych Involvement: No (comment)  Admission diagnosis:  Wound infection [T14.8XXA, L08.9] Abscess of left hip [L02.416] Patient Active Problem List   Diagnosis Date Noted   Wound dehiscence, surgical, sequela    Abscess of left hip 08/26/2021   History of CVA (cerebrovascular accident) 34/74/2595   Hardware complicating wound infection ([place])    Subacute osteomyelitis  of left femur with abscess     Septic arthritis of hip ([place]) 07/30/2021   Skin abscess L hip 07/28/2021   HCAP (healthcare-associated pneumonia) 07/27/2021    Hyperkalemia 19/50/9326   Metabolic acidemia 71/24/5809   Closed fracture of left distal femur ([place]) 07/22/2021   Type 2 diabetes mellitus with hyperlipidemia ([place]) 07/22/2021   Infection of superficial incisional surgical site after procedure 03/09/2020   Abscess    Hypoglycemia    Essential hypertension    Sleep disturbance    ESRD (end stage renal disease) on dialysis ([place]) 01/24/2020   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[place]) 01/23/2020   Hyponatremia    Constipation    Chronic osteomyelitis involving left ankle and foot ([place])    Ulcer of left foot with necrosis of bone ([place])    Wound infection 12/28/2019   History of [place] amputation of left foot ([place]) 12/25/2017   History of CVA (cerebrovascular accident) without residual deficits 07/04/2017   Cerebral thrombosis with cerebral infarction 04/08/2017   Right sided weakness 04/07/2017   Hyperhidrosis 09/01/2016   Migraine with aura and without status migrainosus, not intractable 07/28/2016   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[place]) 08/04/2014   CKD stage 3 due to type 2 diabetes mellitus ([place]) 06/27/2014   Vitamin D insufficiency 05/08/2014   Obesity (BMI 30.0-34.9) 05/08/2014   Unilateral amputation of left foot ([place]) 05/08/2014   Bursitis of left shoulder 02/14/2014   Neck pain 01/03/2014   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.23) 01/14/2013   Insomnia 08/04/2012   Diabetic neuropathy, painful ([place]) 08/04/2011   Anxiety and depression 05/16/2010   Female stress incontinence 11/01/2007   TOBACCO DEPENDENCE 04/30/2006   GASTROESOPHAGEAL REFLUX, NO ESOPHAGITIS 04/30/2006   Irritable bowel syndrome 04/30/2006   PCP:  [name], MD Pharmacy:   [place], [place] [address] [place] [address] Phone: [phone] Fax: [phone]  [id number] - [place], [place] [address] [place] [address] Phone: [phone] Fax: [phone]     Social Determinants of Health (SDOH) Interventions    Readmission Risk Interventions    07/26/2021    3:15 PM  Readmission Risk Prevention Plan  Transportation Screening Complete  PCP or Specialist Appt within 3-5 Days Complete  HRI or Home Care Consult Complete  Palliative Care Screening Not Applicable  Medication Review (RN Care Manager) Referral to Pharmacy

## 2021-12-27 NOTE — Progress Notes (Addendum)
[facility] Progress Note   Subjective:   Seen in KDU --on HD UF goal 4L. She's in the bed. Declined getting in recliner. No new complaints this am.   Objective Vitals:   12/27/21 0721 12/27/21 0740 12/27/21 0800 12/27/21 0830  BP: 138/63 (!) 142/59 (!) 148/63 137/62  Pulse: 60 (!) 58 65   Resp: 12 11 14 11   Temp: 98.2 F (36.8 C)     TempSrc: Oral     SpO2: 100% 100% 100%   Weight: 94.2 kg     Height:       Physical Exam General: WDWN female in NAD Heart: RRR, no mrg Lungs: CTAB, nml WOB on RA Abdomen: soft, NTND, +flank edema Extremities: L BKA, 1+ edema on R Dialysis Access: LU AVF +b/t   Filed Weights   12/23/21 1349 12/25/21 1418 12/27/21 0721  Weight: 89.7 kg 90.5 kg 94.2 kg    Intake/Output Summary (Last 24 hours) at 12/27/2021 0848 Last data filed at 12/27/2021 0200 Gross per 24 hour  Intake 120 ml  Output --  Net 120 ml     Additional Objective Labs: Basic Metabolic Panel: Recent Labs  Lab 12/23/21 0820 12/25/21 0852 12/26/21 0801  NA 127* 128* 126*  K 5.0 4.4 4.4  CL 87* 84* 87*  CO2 26 27 26   GLUCOSE 129* 204* 163*  BUN 89* 70* 67*  CREATININE 6.93* 6.48* 6.62*  CALCIUM 9.2 9.4 9.5  PHOS 5.6* 5.6*  --     Liver Function Tests: Recent Labs  Lab 12/23/21 0820 12/25/21 0852  ALBUMIN 3.6 3.4*    CBC: Recent Labs  Lab 12/23/21 0820 12/25/21 0852 12/26/21 0801 12/27/21 0750  WBC 7.3 8.1 7.4 6.7  HGB 9.3* 9.1* 9.8* 9.4*  HCT 28.2* 27.3* 30.9* 27.7*  MCV 85.2 86.1 87.3 83.9  PLT 233 224 225 229    CBG: Recent Labs  Lab 12/25/21 2027 12/26/21 0715 12/26/21 1134 12/26/21 1654 12/26/21 2033  GLUCAP 237* 153* 165* 145* 130*     Medications:  albumin human     anticoagulant sodium citrate     methocarbamol (ROBAXIN) IV      (feeding supplement) PROSource Plus  30 mL Oral BID BM   alosetron  1 mg Oral BID   amitriptyline  25 mg Oral QHS   atorvastatin  40 mg Oral q1800   carvedilol  3.125 mg Oral BID    Chlorhexidine Gluconate Cloth  6 each Topical Q0600   cyclobenzaprine  10 mg Oral TID   darbepoetin (ARANESP) injection - DIALYSIS  60 mcg Subcutaneous Q Fri-1800   docusate sodium  200 mg Oral BID   DULoxetine  60 mg Oral Daily   famotidine  20 mg Oral Daily   fentaNYL  1 patch Transdermal Q72H   furosemide  80 mg Oral Daily   heparin sodium (porcine)       insulin aspart  0-9 Units Subcutaneous TID WC   insulin aspart  4 Units Subcutaneous TID WC   insulin glargine-yfgn  9 Units Subcutaneous QHS   multivitamin  1 tablet Oral QHS   pantoprazole  80 mg Oral Daily   polyethylene glycol  17 g Oral Daily   sevelamer carbonate  2,400 mg Oral TID WC   sodium zirconium cyclosilicate  10 g Oral Once per day on Sun Tue Thu Sat    Dialysis Orders: MWF at [facility], 400/500, EDW 90kg, 2K/2Ca, LUE AVF - heparin 4000 unit IV bolus - No  ESA   Assessment/Plan:   Recurrent L hip osteomyelitis: S/p 08/10/21 surgery (removal hip IMN which was placed 12/2019) with persistent VRE infection. Back to OR 08/28/21, 08/31/21 for L hip debridement /wound vac  (now removed).  Completed 6 wk course of IV Daptomycin on 10/12/21. Management per primary team.  Chronic L hip/residual limb pain: She does have fractures of L femoral neck (where hardware was removed) as well as distal femur Fx. Pain control per primary.  ESRD: MWF HD on schedule.   Clotting during treatment, add 2000 unit mid run dose heparin + 4000 unit heparin bolus. 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 Virl Cagey. AVF working well now. HTN/volume: BP stable on Coreg 6.25 mg BID (hold a.m. of HD) - d/c hydralazine 10/24.  Lower Coreg to 3.125mg  BID on 10/25.  Continue to attempt taper DOWN BP meds with UF on HD to prevent cramping.  Ongoing LE and flank edema noted. Hx large gains. Stressed fluid and sodium restrictions.  Continue UF as tolerated.  New EDW since admitted in  the works.   Anemia of ESRD: Hgb 9s stable. Tsat 54%.  Aranesp increased to 60q wk starting 10/20. Secondary HPTH: CorrCa slightly high, not on VDRA. Phos variable, usually 5-6. Continue Renvela as binder. Nutrition: Alb low, continue protein supplements.  Renal diet w/fluid restrictions.  DM2: Insulin per primary Dispo: SNF previously recommended, now she would like to go home on discharge even though HD unit has been clear that they cannot assist patient out of her car and into the facility. Patient is aware of this.  Not home HD candidate.  Dialyzed in recliner on 9/4 but signed off early due to pain, has declined to come in a recliner since then.Strongly discussed on the importance of increasing her mobility as much as possible while she is in her room in addition to following her ongoing fluid restriction. Also reinforced the importance of being in the chair for HD treatments to improve strength and mobility. Our goal is to dialyze her in the mornings so the patient has more time to work with PT in the afternoons (we will do our best to accommodate this as we still need to always consider the emergent cases).  Encouraged ongoing progress. Per Renal Navigator notes there are no clinics that will accept patient unless she will have HD in chair. Will continue to encourage use of chair for HD  Lynnda Child PA-C [place] 12/27/2021,8:48 AM   Seen and examined independently.  Agree with note and exam as documented above by physician extender and as noted here.  Seen and examined on dialysis.  Blood pressure 128/61 and HR 68.  Tolerating goal.  In bed as she has refused HD in a chair.    Hyponatremia - I discussed with patient.  She has been on amitriptyline and cymbalta for a while - she'll think of which one she would prefer that we stop or reduce if needed.  For now optimize volume with HD.  She still does make urine and is on lasix.  Await today's labs.    Claudia Desanctis,  MD 12/27/2021  10:01 AM

## 2021-12-28 DIAGNOSIS — M86252 Subacute osteomyelitis, left femur: Secondary | ICD-10-CM | POA: Diagnosis not present

## 2021-12-28 LAB — RENAL FUNCTION PANEL
Albumin: 3.7 g/dL (ref 3.5–5.0)
Anion gap: 15 (ref 5–15)
BUN: 66 mg/dL — ABNORMAL HIGH (ref 6–20)
CO2: 26 mmol/L (ref 22–32)
Calcium: 9.4 mg/dL (ref 8.9–10.3)
Chloride: 87 mmol/L — ABNORMAL LOW (ref 98–111)
Creatinine, Ser: 5.43 mg/dL — ABNORMAL HIGH (ref 0.44–1.00)
GFR, Estimated: 9 mL/min — ABNORMAL LOW (ref >60)
Glucose, Bld: 198 mg/dL — ABNORMAL HIGH (ref 70–99)
Phosphorus: 5.6 mg/dL — ABNORMAL HIGH (ref 2.5–4.6)
Potassium: 4.7 mmol/L (ref 3.5–5.1)
Sodium: 128 mmol/L — ABNORMAL LOW (ref 135–145)

## 2021-12-28 LAB — GLUCOSE, CAPILLARY
Glucose-Capillary: 131 mg/dL — ABNORMAL HIGH (ref 70–99)
Glucose-Capillary: 222 mg/dL — ABNORMAL HIGH (ref 70–99)
Glucose-Capillary: 110 mg/dL — ABNORMAL HIGH (ref 70–99)
Glucose-Capillary: 180 mg/dL — ABNORMAL HIGH (ref 70–99)

## 2021-12-28 NOTE — Progress Notes (Signed)
PROGRESS NOTE    [name]  AYT:[id number] DOB: 18-Aug-1965 DOA: 08/26/2021 PCP: [name], MD   Brief Narrative:   56 year old female with history of hypertension, hyperlipidemia, diabetes mellitus type 2, end-stage renal disease on hemodialysis, left BKA for chronic calcaneus osteomyelitis on 01/12/2020, bipolar disorder, left distal femoral fracture after a fall on 07/19/2021 deemed  nonoperative, admission from 08/03/2021-08/23/2021 for left humeral osteomyelitis requiring partial resection of femur and rearrangements of bony fragments with subsequent antibiotic treatment presented with left hip bleeding and was found to have subacute osteomyelitis and abscess of left femur.  Orthopedic/ID were consulted.  She was started on broad-spectrum antibiotics. She underwent multiple I&D's by Dr. [name] along with local tissue rearrangement. She was also seen by vascular surgery who did not fistula branch ligation.  She has completed 6-week course of daptomycin on 10/09/2021.  Discharge is pending ability for patient to tolerate hemodialysis in a chair.     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  Active Problems:   ESRD (end stage renal disease) on dialysis ([place])   Type 2 diabetes mellitus with hyperlipidemia (H[facility])   History of CVA (cerebrovascular accident)   Essential hypertension   Anxiety and depression   GASTROESOPHAGEAL REFLUX, NO ESOPHAGITIS   TOBACCO DEPENDENCE   Hyponatremia   Obesity (BMI 30.0-34.9)   Unilateral amputation of left foot ([facility])   Wound infection   History of CVA (cerebrovascular accident) without residual deficits   Hyperkalemia   Wound dehiscence, surgical, sequela   Subacute osteomyelitis of left femur with abscess: -Status post surgical intervention by Dr. [name].  Completed 6 weeks of daptomycin on 8/9 and was supposed to follow-up outpatient with Dr. [name] in 1 week thereafter.  Reimaging performed on 10/2 showed  healing fractures with callus.  Continue pain management at this time.  Seen by Dr. [name] again this morning, looks to be doing well, no further restrictions for left hip She has completed 6-week course of daptomycin on 10/09/2021.     Right thigh mild cellulitis: Completed 5-day course of antibiotics on 12/18/2021.   Symptoms most likely from anasarca. Improving   History of left intertrochanteric fracture: Left hip hardware removed on 08/10/2021 prior to admission. Continue adequate pain control.   End-stage renal disease on hemodialysis: She has generalized anasarca.  Nephrology following Tells me due to her hip pain she can't tolerate sitting up in the chair   Hyponatremia/hyperphosphatemia Management with hemodialysis On sevelamer   Anemia of chronic disease H&H currently stable.  Monitor intermittently   Type 2 diabetes with hyperglycemia Continue long-acting insulin increase 9 units, NovoLog with meals along with CBGs with SSI.  Off-and-on has hyperglycemia, eating junk food brought in by family.  I have advised nursing staff to restrict this and she needs to follow strict diet in the hospital.   Essential hypertension: On Coreg, daily Lasix.  IV as needed ordered   History of unspecified CVA Hyperlipidemia Continue Lipitor 40 mg daily   GERD On Protonix   Tobacco use Tobacco cessation counseled.   Anxiety and depression: Continue amitriptyline and Cymbalta.   Goals of care discussion: Prognosis is guarded to poor.  Remains full code.   Palliative care has already evaluated the patient during this hospitalization      DVT prophylaxis: Place TED hose Start: 09/06/21 1507 SCDs Start: 08/31/21 1058 Code Status: Full code Family Communication:    Status is: Inpatient Ongoing safe disposition planning.  Difficult to  place care until she is able to tolerate hemodialysis in chair.  Nephrology working with the patient Patient needs to follow strict diet in the  hospital    Subjective: Seen and examined at bedside.  Continues to report of left hip pain and very hesitant to get out of bed to chair and move around.  Examination: Constitutional: Not in acute distress Respiratory: Clear to auscultation bilaterally Cardiovascular: Normal sinus rhythm, no rubs Abdomen: Nontender nondistended good bowel sounds Musculoskeletal: No edema noted Skin: Hip incision appears to be well-healed with dry dressing Neurologic: CN 2-12 grossly intact.  And nonfocal Psychiatric: Normal judgment and insight. Alert and oriented x 3. Normal mood.  Left upper extremity AV fistula External female catheter   Objective: Vitals:   12/27/21 1703 12/27/21 1704 12/27/21 2121 12/28/21 0519  BP: (!) 109/53 (!) 109/53 (!) 119/53 (!) 138/123  Pulse: 64 64 68 60  Resp: 14 15 18 16   Temp: 98.4 F (36.9 C) 98.4 F (36.9 C) 98.2 F (36.8 C) 98.2 F (36.8 C)  TempSrc: Oral Oral    SpO2: 98% 97% 100% 99%  Weight:      Height:        Intake/Output Summary (Last 24 hours) at 12/28/2021 0759 Last data filed at 12/28/2021 0600 Gross per 24 hour  Intake 600 ml  Output 4000 ml  Net -3400 ml   Filed Weights   12/25/21 1418 12/27/21 0721 12/27/21 1144  Weight: 90.5 kg 94.2 kg 91 kg     Data Reviewed:   CBC: Recent Labs  Lab 12/23/21 0820 12/25/21 0852 12/26/21 0801 12/27/21 0750  WBC 7.3 8.1 7.4 6.7  HGB 9.3* 9.1* 9.8* 9.4*  HCT 28.2* 27.3* 30.9* 27.7*  MCV 85.2 86.1 87.3 83.9  PLT 233 224 225 416   Basic Metabolic Panel: Recent Labs  Lab 12/23/21 0820 12/25/21 0852 12/26/21 0458 12/26/21 0801  NA 127* 128*  --  126*  K 5.0 4.4  --  4.4  CL 87* 84*  --  87*  CO2 26 27  --  26  GLUCOSE 129* 204*  --  163*  BUN 89* 70*  --  67*  CREATININE 6.93* 6.48*  --  6.62*  CALCIUM 9.2 9.4  --  9.5  MG  --   --  1.9 1.9  PHOS 5.6* 5.6*  --   --    GFR: Estimated Creatinine Clearance: 11.9 mL/min (A) (by C-G formula based on SCr of 6.62 mg/dL  (H)). Liver Function Tests: Recent Labs  Lab 12/23/21 0820 12/25/21 0852  ALBUMIN 3.6 3.4*   No results for input(s): "LIPASE", "AMYLASE" in the last 168 hours. No results for input(s): "AMMONIA" in the last 168 hours. Coagulation Profile: No results for input(s): "INR", "PROTIME" in the last 168 hours. Cardiac Enzymes: No results for input(s): "CKTOTAL", "CKMB", "CKMBINDEX", "TROPONINI" in the last 168 hours. BNP (last 3 results) No results for input(s): "PROBNP" in the last 8760 hours. HbA1C: No results for input(s): "HGBA1C" in the last 72 hours. CBG: Recent Labs  Lab 12/26/21 2033 12/27/21 1224 12/27/21 1617 12/27/21 2120 12/28/21 0721  GLUCAP 130* 166* 171* 209* 131*   Lipid Profile: No results for input(s): "CHOL", "HDL", "LDLCALC", "TRIG", "CHOLHDL", "LDLDIRECT" in the last 72 hours. Thyroid Function Tests: No results for input(s): "TSH", "T4TOTAL", "FREET4", "T3FREE", "THYROIDAB" in the last 72 hours. Anemia Panel: No results for input(s): "VITAMINB12", "FOLATE", "FERRITIN", "TIBC", "IRON", "RETICCTPCT" in the last 72 hours. Sepsis Labs: No results for input(s): "PROCALCITON", "  LATICACIDVEN" in the last 168 hours.  No results found for this or any previous visit (from the past 240 hour(s)).       Radiology Studies: No results found.      Scheduled Meds:  (feeding supplement) PROSource Plus  30 mL Oral BID BM   alosetron  1 mg Oral BID   amitriptyline  25 mg Oral QHS   atorvastatin  40 mg Oral q1800   carvedilol  3.125 mg Oral BID   Chlorhexidine Gluconate Cloth  6 each Topical Q0600   cyclobenzaprine  10 mg Oral TID   darbepoetin (ARANESP) injection - DIALYSIS  60 mcg Subcutaneous Q Fri-1800   docusate sodium  200 mg Oral BID   DULoxetine  60 mg Oral Daily   famotidine  20 mg Oral Daily   fentaNYL  1 patch Transdermal Q72H   furosemide  80 mg Oral Daily   insulin aspart  0-9 Units Subcutaneous TID WC   insulin aspart  4 Units Subcutaneous TID  WC   insulin glargine-yfgn  9 Units Subcutaneous QHS   multivitamin  1 tablet Oral QHS   pantoprazole  80 mg Oral Daily   polyethylene glycol  17 g Oral Daily   sevelamer carbonate  2,400 mg Oral TID WC   sodium zirconium cyclosilicate  10 g Oral Once per day on Sun Tue Thu Sat   Continuous Infusions:  albumin human     methocarbamol (ROBAXIN) IV       LOS: 124 days   Time spent= 35 mins    Elisheva Fallas Arsenio Loader, MD [facility]  If 7PM-7AM, please contact night-coverage  12/28/2021, 7:59 AM

## 2021-12-28 NOTE — Progress Notes (Signed)
Patient ID: [name], female   DOB: Sep 26, 1965, 56 y.o.   MRN: [id number] Patient is status post debridement chronic osteomyelitis left hip.  Examination the incision is well-healed there is no redness no cellulitis the dressing is dry.  Patient has no restrictions with the left hip.

## 2021-12-28 NOTE — Progress Notes (Addendum)
[facility] Progress Note   Subjective:  Seen in room. Completed dialysis yesterday with 4L UF. Talked about chair dialysis - has a lot of reasons why she's not ready, but does say she's up to 3 hours sitting in recliner.  Objective Vitals:   12/27/21 1704 12/27/21 2121 12/28/21 0519 12/28/21 0853  BP: (!) 109/53 (!) 119/53 (!) 138/123 (!) 119/48  Pulse: 64 68 60 63  Resp: 15 18 16 14   Temp: 98.4 F (36.9 C) 98.2 F (36.8 C) 98.2 F (36.8 C) 98.9 F (37.2 C)  TempSrc: Oral     SpO2: 97% 100% 99% 100%  Weight:      Height:       Physical Exam General: WDWN female in NAD Heart: RRR, no mrg Lungs: CTAB, nml WOB on RA Abdomen: soft, NTND, +flank edema Extremities: L BKA, 1+ edema on R Dialysis Access: LU AVF +b/t   Filed Weights   12/25/21 1418 12/27/21 0721 12/27/21 1144  Weight: 90.5 kg 94.2 kg 91 kg    Intake/Output Summary (Last 24 hours) at 12/28/2021 1121 Last data filed at 12/28/2021 0600 Gross per 24 hour  Intake 600 ml  Output 4000 ml  Net -3400 ml     Additional Objective Labs: Basic Metabolic Panel: Recent Labs  Lab 12/23/21 0820 12/25/21 0852 12/26/21 0801  NA 127* 128* 126*  K 5.0 4.4 4.4  CL 87* 84* 87*  CO2 26 27 26   GLUCOSE 129* 204* 163*  BUN 89* 70* 67*  CREATININE 6.93* 6.48* 6.62*  CALCIUM 9.2 9.4 9.5  PHOS 5.6* 5.6*  --     Liver Function Tests: Recent Labs  Lab 12/23/21 0820 12/25/21 0852  ALBUMIN 3.6 3.4*    CBC: Recent Labs  Lab 12/23/21 0820 12/25/21 0852 12/26/21 0801 12/27/21 0750  WBC 7.3 8.1 7.4 6.7  HGB 9.3* 9.1* 9.8* 9.4*  HCT 28.2* 27.3* 30.9* 27.7*  MCV 85.2 86.1 87.3 83.9  PLT 233 224 225 229    CBG: Recent Labs  Lab 12/26/21 2033 12/27/21 1224 12/27/21 1617 12/27/21 2120 12/28/21 0721  GLUCAP 130* 166* 171* 209* 131*     Medications:  albumin human     methocarbamol (ROBAXIN) IV      (feeding supplement) PROSource Plus  30 mL Oral BID BM   alosetron  1 mg Oral BID    amitriptyline  25 mg Oral QHS   atorvastatin  40 mg Oral q1800   carvedilol  3.125 mg Oral BID   Chlorhexidine Gluconate Cloth  6 each Topical Q0600   cyclobenzaprine  10 mg Oral TID   darbepoetin (ARANESP) injection - DIALYSIS  60 mcg Subcutaneous Q Fri-1800   docusate sodium  200 mg Oral BID   DULoxetine  60 mg Oral Daily   famotidine  20 mg Oral Daily   fentaNYL  1 patch Transdermal Q72H   furosemide  80 mg Oral Daily   insulin aspart  0-9 Units Subcutaneous TID WC   insulin aspart  4 Units Subcutaneous TID WC   insulin glargine-yfgn  9 Units Subcutaneous QHS   multivitamin  1 tablet Oral QHS   pantoprazole  80 mg Oral Daily   polyethylene glycol  17 g Oral Daily   sevelamer carbonate  2,400 mg Oral TID WC   sodium zirconium cyclosilicate  10 g Oral Once per day on Sun Tue Thu Sat    Dialysis Orders: MWF at [facility], 400/500, EDW 90kg, 2K/2Ca, LUE AVF - heparin 4000  unit IV bolus - No ESA   Assessment/Plan:   Recurrent L hip osteomyelitis: S/p 08/10/21 surgery (removal hip IMN which was placed 12/2019) with persistent VRE infection. Back to OR 08/28/21, 08/31/21 for L hip debridement /wound vac  (now removed).  Completed 6 wk course of IV Daptomycin on 10/12/21. Management per primary team.  Chronic L hip/residual limb pain: She does have fractures of L femoral neck (where hardware was removed) as well as distal femur Fx. Pain control per primary.  ESRD: MWF HD on schedule.   Clotting during treatment, add 2000 unit mid run dose heparin + 4000 unit heparin bolus. 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 Virl Cagey. AVF working well now. HTN/volume: BP stable on Coreg 6.25 mg BID (hold a.m. of HD) - d/c hydralazine 10/24.  Lower Coreg to 3.125mg  BID on 10/25.  Continue to attempt taper DOWN BP meds with UF on HD to prevent cramping.  Ongoing LE and flank edema noted. Hx large gains. Stressed fluid  and sodium restrictions.  Continue UF as tolerated.  New EDW since admitted in the works.   Anemia of ESRD: Hgb 9s stable. Tsat 54%.  Aranesp increased to 60q wk starting 10/20. Secondary HPTH: CorrCa slightly high, not on VDRA. Phos variable, usually 5-6. Continue Renvela as binder. Nutrition: Alb low, continue protein supplements.  Renal diet w/fluid restrictions.  DM2: Insulin per primary Dispo: SNF previously recommended, now she would like to go home on discharge even though HD unit has been clear that they cannot assist patient out of her car and into the facility. Patient is aware of this.  Not home HD candidate.  Dialyzed in recliner on 9/4 but signed off early due to pain, has declined to come in a recliner since then.Strongly discussed on the importance of increasing her mobility as much as possible while she is in her room in addition to following her ongoing fluid restriction. Also reinforced the importance of being in the chair for HD treatments to improve strength and mobility. Our goal is to dialyze her in the mornings so the patient has more time to work with PT in the afternoons (we will do our best to accommodate this as we still need to always consider the emergent cases).  Encouraged ongoing progress. Per Renal Navigator notes there are no clinics that will accept patient unless she will have HD in chair. Will continue to encourage use of chair for HD  Lynnda Child PA-C [facility] 12/28/2021,11:21 AM   Seen and examined independently.  Agree with note and exam as documented above by physician extender and as noted here.  Note ortho re-evaluated her on 12/28/21.  Incision was well-healed and no erythema per note.  Orthopedics indicates that she has no restrictions with the left hip.    General adult female in bed in no acute distress HEENT normocephalic atraumatic extraocular movements intact sclera anicteric Neck supple trachea midline Lungs clear to  auscultation bilaterally normal work of breathing at rest  Heart S1S2 no rub Abdomen soft nontender nondistended Extremities no edema  Psych frustrated  Access LUE AVF bruit and thrill   Osteomyelitis - s/p abx per primary team   Left hip pain - s/p reassessment with ortho and per their note she has no restrictions with the left hip   ESRD - HD per MWF schedule.  We have encouraged her to try HD in  a chair    Hyponatremia - I discussed with patient.  Improved. She has been on amitriptyline and cymbalta for a while - she'll think of which one she would prefer that we stop or reduce if needed.  For now optimize volume with HD.  She still does make urine and is on lasix.    HTN optimize volume with HD    Dispo - as above she as not agreed to attempt dialysis in a chair recently and has no accepting outpatient unit. Per ortho assessment she has no restrictions with the left hip  Claudia Desanctis, MD 12/28/2021  1:19 PM

## 2021-12-29 DIAGNOSIS — M86252 Subacute osteomyelitis, left femur: Secondary | ICD-10-CM | POA: Diagnosis not present

## 2021-12-29 LAB — CBC
HCT: 29.4 % — ABNORMAL LOW (ref 36.0–46.0)
Hemoglobin: 9.6 g/dL — ABNORMAL LOW (ref 12.0–15.0)
MCH: 28 pg (ref 26.0–34.0)
MCHC: 32.7 g/dL (ref 30.0–36.0)
MCV: 85.7 fL (ref 80.0–100.0)
Platelets: 210 10*3/uL (ref 150–400)
RBC: 3.43 MIL/uL — ABNORMAL LOW (ref 3.87–5.11)
RDW: 17.2 % — ABNORMAL HIGH (ref 11.5–15.5)
WBC: 6.4 10*3/uL (ref 4.0–10.5)
nRBC: 0 % (ref 0.0–0.2)

## 2021-12-29 LAB — GLUCOSE, CAPILLARY
Glucose-Capillary: 153 mg/dL — ABNORMAL HIGH (ref 70–99)
Glucose-Capillary: 128 mg/dL — ABNORMAL HIGH (ref 70–99)
Glucose-Capillary: 152 mg/dL — ABNORMAL HIGH (ref 70–99)
Glucose-Capillary: 122 mg/dL — ABNORMAL HIGH (ref 70–99)

## 2021-12-29 LAB — MAGNESIUM: Magnesium: 1.8 mg/dL (ref 1.7–2.4)

## 2021-12-29 NOTE — Progress Notes (Signed)
PROGRESS NOTE    [name]  OBS:[id number] DOB: 04/17/1965 DOA: 08/26/2021 PCP: [name], MD   Brief Narrative:   56 year old female with history of hypertension, hyperlipidemia, diabetes mellitus type 2, end-stage renal disease on hemodialysis, left BKA for chronic calcaneus osteomyelitis on 01/12/2020, bipolar disorder, left distal femoral fracture after a fall on 07/19/2021 deemed  nonoperative, admission from 08/03/2021-08/23/2021 for left humeral osteomyelitis requiring partial resection of femur and rearrangements of bony fragments with subsequent antibiotic treatment presented with left hip bleeding and was found to have subacute osteomyelitis and abscess of left femur.  Orthopedic/ID were consulted.  She was started on broad-spectrum antibiotics. She underwent multiple I&D's by Dr. [name] along with local tissue rearrangement. She was also seen by vascular surgery who did not fistula branch ligation.  She has completed 6-week course of daptomycin on 10/09/2021.  Discharge is pending ability for patient to tolerate hemodialysis in a chair.     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  Active Problems:   ESRD (end stage renal disease) on dialysis ([place])   Type 2 diabetes mellitus with hyperlipidemia (HCC)   History of CVA (cerebrovascular accident)   Essential hypertension   Anxiety and depression   GASTROESOPHAGEAL REFLUX, NO ESOPHAGITIS   TOBACCO DEPENDENCE   Hyponatremia   Obesity (BMI 30.0-34.9)   Unilateral amputation of left foot (HCC)   Wound infection   History of CVA (cerebrovascular accident) without residual deficits   Hyperkalemia   Wound dehiscence, surgical, sequela   Subacute osteomyelitis of left femur with abscess: -Status post surgical intervention by Dr. [name].  Completed 6 weeks of daptomycin on 8/9 and was supposed to follow-up outpatient with Dr. [name] in 1 week thereafter.  Reimaging performed on 10/2 showed  healing fractures with callus.  Continue pain management at this time.  Seen by Dr. [name] again 10/28, looks to be doing well, no further restrictions for left hip She has completed 6-week course of daptomycin on 10/09/2021.     Right thigh mild cellulitis: Completed 5-day course of antibiotics on 12/18/2021.   Symptoms most likely from anasarca.    History of left intertrochanteric fracture: Left hip hardware removed on 08/10/2021 prior to admission. Continue adequate pain control.   End-stage renal disease on hemodialysis: She has generalized anasarca.  Nephrology following Tells me due to her hip pain she can't tolerate sitting up in the chair   Hyponatremia/hyperphosphatemia Management with hemodialysis On sevelamer   Anemia of chronic disease H&H currently stable.  Monitor intermittently   Type 2 diabetes with hyperglycemia Continue long-acting insulin increase 9 units, NovoLog with meals along with CBGs with SSI.  Off-and-on has hyperglycemia, eating junk food brought in by family.  I have advised nursing staff to restrict this and she needs to follow strict diet in the hospital.   Essential hypertension: On Coreg, daily Lasix.  IV as needed ordered   History of unspecified CVA Hyperlipidemia Continue Lipitor 40 mg daily   GERD On Protonix   Tobacco use Tobacco cessation counseled.   Anxiety and depression: Continue amitriptyline and Cymbalta.   Goals of care discussion: Prognosis is guarded to poor.  Remains full code.   Palliative care has already evaluated the patient during this hospitalization      DVT prophylaxis: Place TED hose Start: 09/06/21 1507 SCDs Start: 08/31/21 1058 Code Status: Full code Family Communication:    Status is: Inpatient Ongoing safe disposition planning.  Difficult to place  care until she is able to tolerate hemodialysis in chair.  Nephrology working with the patient Patient needs to follow strict diet in the  hospital    Subjective: No acute events overnight.  Still reporting of left hip pain and not willing to sit in the chair  Examination: Constitutional: Not in acute distress Respiratory: Clear to auscultation bilaterally Cardiovascular: Normal sinus rhythm, no rubs Abdomen: Nontender nondistended good bowel sounds Musculoskeletal: No edema noted Skin: No rashes seen Neurologic: CN 2-12 grossly intact.  And nonfocal Psychiatric: Normal judgment and insight. Alert and oriented x 3. Normal mood.  Left upper extremity AV fistula External female catheter   Objective: Vitals:   12/28/21 1127 12/28/21 1708 12/28/21 2117 12/29/21 0629  BP: (!) 139/50 112/60 133/65 137/62  Pulse: (!) 59 (!) 57 (!) 57 60  Resp: 14  18 18   Temp: 98.9 F (37.2 C) 98.1 F (36.7 C) 98.5 F (36.9 C) 98.3 F (36.8 C)  TempSrc:   Oral Oral  SpO2: 100% 99% 99% 100%  Weight:      Height:        Intake/Output Summary (Last 24 hours) at 12/29/2021 0750 Last data filed at 12/29/2021 0631 Gross per 24 hour  Intake 0 ml  Output 0 ml  Net 0 ml   Filed Weights   12/25/21 1418 12/27/21 0721 12/27/21 1144  Weight: 90.5 kg 94.2 kg 91 kg     Data Reviewed:   CBC: Recent Labs  Lab 12/23/21 0820 12/25/21 0852 12/26/21 0801 12/27/21 0750  WBC 7.3 8.1 7.4 6.7  HGB 9.3* 9.1* 9.8* 9.4*  HCT 28.2* 27.3* 30.9* 27.7*  MCV 85.2 86.1 87.3 83.9  PLT 233 224 225 628   Basic Metabolic Panel: Recent Labs  Lab 12/23/21 0820 12/25/21 0852 12/26/21 0458 12/26/21 0801 12/28/21 1058  NA 127* 128*  --  126* 128*  K 5.0 4.4  --  4.4 4.7  CL 87* 84*  --  87* 87*  CO2 26 27  --  26 26  GLUCOSE 129* 204*  --  163* 198*  BUN 89* 70*  --  67* 66*  CREATININE 6.93* 6.48*  --  6.62* 5.43*  CALCIUM 9.2 9.4  --  9.5 9.4  MG  --   --  1.9 1.9  --   PHOS 5.6* 5.6*  --   --  5.6*   GFR: Estimated Creatinine Clearance: 14.5 mL/min (A) (by C-G formula based on SCr of 5.43 mg/dL (H)). Liver Function Tests: Recent  Labs  Lab 12/23/21 0820 12/25/21 0852 12/28/21 1058  ALBUMIN 3.6 3.4* 3.7   No results for input(s): "LIPASE", "AMYLASE" in the last 168 hours. No results for input(s): "AMMONIA" in the last 168 hours. Coagulation Profile: No results for input(s): "INR", "PROTIME" in the last 168 hours. Cardiac Enzymes: No results for input(s): "CKTOTAL", "CKMB", "CKMBINDEX", "TROPONINI" in the last 168 hours. BNP (last 3 results) No results for input(s): "PROBNP" in the last 8760 hours. HbA1C: No results for input(s): "HGBA1C" in the last 72 hours. CBG: Recent Labs  Lab 12/28/21 0721 12/28/21 1131 12/28/21 1708 12/28/21 2049 12/29/21 0736  GLUCAP 131* 222* 110* 180* 153*   Lipid Profile: No results for input(s): "CHOL", "HDL", "LDLCALC", "TRIG", "CHOLHDL", "LDLDIRECT" in the last 72 hours. Thyroid Function Tests: No results for input(s): "TSH", "T4TOTAL", "FREET4", "T3FREE", "THYROIDAB" in the last 72 hours. Anemia Panel: No results for input(s): "VITAMINB12", "FOLATE", "FERRITIN", "TIBC", "IRON", "RETICCTPCT" in the last 72 hours. Sepsis Labs: No results  for input(s): "PROCALCITON", "LATICACIDVEN" in the last 168 hours.  No results found for this or any previous visit (from the past 240 hour(s)).       Radiology Studies: No results found.      Scheduled Meds:  (feeding supplement) PROSource Plus  30 mL Oral BID BM   alosetron  1 mg Oral BID   amitriptyline  25 mg Oral QHS   atorvastatin  40 mg Oral q1800   carvedilol  3.125 mg Oral BID   Chlorhexidine Gluconate Cloth  6 each Topical Q0600   cyclobenzaprine  10 mg Oral TID   darbepoetin (ARANESP) injection - DIALYSIS  60 mcg Subcutaneous Q Fri-1800   docusate sodium  200 mg Oral BID   DULoxetine  60 mg Oral Daily   famotidine  20 mg Oral Daily   fentaNYL  1 patch Transdermal Q72H   furosemide  80 mg Oral Daily   insulin aspart  0-9 Units Subcutaneous TID WC   insulin aspart  4 Units Subcutaneous TID WC   insulin  glargine-yfgn  9 Units Subcutaneous QHS   multivitamin  1 tablet Oral QHS   pantoprazole  80 mg Oral Daily   polyethylene glycol  17 g Oral Daily   sevelamer carbonate  2,400 mg Oral TID WC   sodium zirconium cyclosilicate  10 g Oral Once per day on Sun Tue Thu Sat   Continuous Infusions:  albumin human     methocarbamol (ROBAXIN) IV       LOS: 125 days   Time spent= 35 mins    Tiffiany Beadles Arsenio Loader, MD [facility]  If 7PM-7AM, please contact night-coverage  12/29/2021, 7:50 AM

## 2021-12-29 NOTE — Progress Notes (Addendum)
[facility] Progress Note   Subjective:  Seen in room. No new complaints. Per. Dr. [name] hip is well healed and no restrictions  Objective Vitals:   12/28/21 1708 12/28/21 2117 12/29/21 0629 12/29/21 1044  BP: 112/60 133/65 137/62 (!) 146/58  Pulse: (!) 57 (!) 57 60 61  Resp:  18 18 18   Temp: 98.1 F (36.7 C) 98.5 F (36.9 C) 98.3 F (36.8 C) 98.2 F (36.8 C)  TempSrc:  Oral Oral Oral  SpO2: 99% 99% 100% 100%  Weight:      Height:       Physical Exam General: WDWN female in NAD Heart: RRR, no mrg Lungs: CTAB, nml WOB on RA Abdomen: soft, NTND, +flank edema Extremities: L BKA, 1+ edema on R Dialysis Access: LU AVF +b/t   Filed Weights   12/25/21 1418 12/27/21 0721 12/27/21 1144  Weight: 90.5 kg 94.2 kg 91 kg    Intake/Output Summary (Last 24 hours) at 12/29/2021 1107 Last data filed at 12/29/2021 0631 Gross per 24 hour  Intake 0 ml  Output 0 ml  Net 0 ml     Additional Objective Labs: Basic Metabolic Panel: Recent Labs  Lab 12/23/21 0820 12/25/21 0852 12/26/21 0801 12/28/21 1058  NA 127* 128* 126* 128*  K 5.0 4.4 4.4 4.7  CL 87* 84* 87* 87*  CO2 26 27 26 26   GLUCOSE 129* 204* 163* 198*  BUN 89* 70* 67* 66*  CREATININE 6.93* 6.48* 6.62* 5.43*  CALCIUM 9.2 9.4 9.5 9.4  PHOS 5.6* 5.6*  --  5.6*    Liver Function Tests: Recent Labs  Lab 12/23/21 0820 12/25/21 0852 12/28/21 1058  ALBUMIN 3.6 3.4* 3.7    CBC: Recent Labs  Lab 12/23/21 0820 12/25/21 0852 12/26/21 0801 12/27/21 0750  WBC 7.3 8.1 7.4 6.7  HGB 9.3* 9.1* 9.8* 9.4*  HCT 28.2* 27.3* 30.9* 27.7*  MCV 85.2 86.1 87.3 83.9  PLT 233 224 225 229    CBG: Recent Labs  Lab 12/28/21 0721 12/28/21 1131 12/28/21 1708 12/28/21 2049 12/29/21 0736  GLUCAP 131* 222* 110* 180* 153*     Medications:  albumin human     methocarbamol (ROBAXIN) IV      (feeding supplement) PROSource Plus  30 mL Oral BID BM   alosetron  1 mg Oral BID   amitriptyline  25 mg Oral QHS    atorvastatin  40 mg Oral q1800   carvedilol  3.125 mg Oral BID   Chlorhexidine Gluconate Cloth  6 each Topical Q0600   cyclobenzaprine  10 mg Oral TID   darbepoetin (ARANESP) injection - DIALYSIS  60 mcg Subcutaneous Q Fri-1800   docusate sodium  200 mg Oral BID   DULoxetine  60 mg Oral Daily   famotidine  20 mg Oral Daily   fentaNYL  1 patch Transdermal Q72H   furosemide  80 mg Oral Daily   insulin aspart  0-9 Units Subcutaneous TID WC   insulin aspart  4 Units Subcutaneous TID WC   insulin glargine-yfgn  9 Units Subcutaneous QHS   multivitamin  1 tablet Oral QHS   pantoprazole  80 mg Oral Daily   polyethylene glycol  17 g Oral Daily   sevelamer carbonate  2,400 mg Oral TID WC   sodium zirconium cyclosilicate  10 g Oral Once per day on Sun Tue Thu Sat    Dialysis Orders: MWF at [place], 400/500, EDW 90kg, 2K/2Ca, LUE AVF - heparin 4000 unit IV bolus - No  ESA   Assessment/Plan:   Recurrent L hip osteomyelitis: S/p 08/10/21 surgery (removal hip IMN which was placed 12/2019) with persistent VRE infection. Back to OR 08/28/21, 08/31/21 for L hip debridement /wound vac  (now removed).  Completed 6 wk course of IV Daptomycin on 10/12/21. Per Dr. [name] 12/28/21- hip is well healed and has no restrictions with the hip  Chronic L hip/residual limb pain: She does have fractures of L femoral neck (where hardware was removed) as well as distal femur Fx. Pain control per primary.  ESRD: MWF HD on schedule.   Clotting during treatment, add 2000 unit mid run dose heparin + 4000 unit heparin bolus. 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 Virl Cagey. AVF working well now. HTN/volume: BP stable on Coreg 6.25 mg BID (hold a.m. of HD) - d/c hydralazine 10/24.  Lower Coreg to 3.125mg  BID on 10/25.  Continue to attempt taper DOWN BP meds with UF on HD to prevent cramping.  Ongoing LE and flank edema noted. Hx large  gains. Stressed fluid and sodium restrictions.  Continue UF as tolerated.  New EDW since admitted in the works.   Anemia of ESRD: Hgb 9s stable. Tsat 54%.  Aranesp increased to 60q wk starting 10/20. Secondary HPTH: CorrCa slightly high, not on VDRA. Phos variable, usually 5-6. Continue Renvela as binder. Nutrition: Alb low, continue protein supplements.  Renal diet w/fluid restrictions.  DM2: Insulin per primary Dispo: SNF previously recommended, now she would like to go home on discharge even though HD unit has been clear that they cannot assist patient out of her car and into the facility. Patient is aware of this.  Not home HD candidate.  Dialyzed in recliner on 9/4 but signed off early due to pain, has declined to come in a recliner since then.Strongly discussed on the importance of increasing her mobility as much as possible while she is in her room in addition to following her ongoing fluid restriction. Also reinforced the importance of being in the chair for HD treatments to improve strength and mobility. Our goal is to dialyze her in the mornings so the patient has more time to work with PT in the afternoons (we will do our best to accommodate this as we still need to always consider the emergent cases).  Encouraged ongoing progress. Per Renal Navigator notes there are no clinics that will accept patient unless she will have HD in chair. Will continue to encourage use of chair for HD  Lynnda Child PA-C [facility] 12/29/2021,11:07 AM   Seen and examined independently.  Agree with note and exam as documented above by physician extender and as noted here.  Her daughter is at bedside.  She brings up that she is working on sitting up more today.  Some nausea today.  Hand cramping with HD sometimes.  She can feel extra fluid in her hips and we discussed trying to get that off.    Note ortho re-evaluated her on 12/28/21.  Incision was well-healed and no erythema per note.   Orthopedics indicates that she has no restrictions with the left hip.     General adult female in bed in no acute distress HEENT normocephalic atraumatic extraocular movements intact sclera anicteric Neck supple trachea midline Lungs clear to auscultation bilaterally normal work of breathing at rest  Heart S1S2 no rub Abdomen soft nontender nondistended Extremities no edema  Psych no  anxiety or agitation   Access LUE AVF bruit and thrill    Osteomyelitis - s/p abx per primary team    Left hip pain - s/p reassessment with ortho and per their note she has no restrictions with the left hip   ESRD - HD per MWF schedule.  We have encouraged her to try HD in a chair    Hyponatremia - I discussed with patient.  Improved. She has been on amitriptyline and cymbalta for a while - she'll think of which one she would prefer that we stop or reduce if needed.  For now optimize volume with HD.  Inc UF as tolerated as she feels she has fluid in her hips.  She still does make urine and is on lasix.    HTN optimize volume with HD    Dispo - as above she as not agreed to attempt dialysis in a chair recently and has no accepting outpatient unit 2/2 same.  Per ortho assessment she has no restrictions with the left hip  Claudia Desanctis, MD 12/29/2021 12:55 PM

## 2021-12-30 DIAGNOSIS — M86252 Subacute osteomyelitis, left femur: Secondary | ICD-10-CM | POA: Diagnosis not present

## 2021-12-30 LAB — RENAL FUNCTION PANEL
Albumin: 3.4 g/dL — ABNORMAL LOW (ref 3.5–5.0)
Anion gap: 17 — ABNORMAL HIGH (ref 5–15)
BUN: 101 mg/dL — ABNORMAL HIGH (ref 6–20)
CO2: 24 mmol/L (ref 22–32)
Calcium: 9 mg/dL (ref 8.9–10.3)
Chloride: 83 mmol/L — ABNORMAL LOW (ref 98–111)
Creatinine, Ser: 7.18 mg/dL — ABNORMAL HIGH (ref 0.44–1.00)
GFR, Estimated: 6 mL/min — ABNORMAL LOW (ref >60)
Glucose, Bld: 140 mg/dL — ABNORMAL HIGH (ref 70–99)
Phosphorus: 6.5 mg/dL — ABNORMAL HIGH (ref 2.5–4.6)
Potassium: 5.1 mmol/L (ref 3.5–5.1)
Sodium: 124 mmol/L — ABNORMAL LOW (ref 135–145)

## 2021-12-30 LAB — GLUCOSE, CAPILLARY
Glucose-Capillary: 195 mg/dL — ABNORMAL HIGH (ref 70–99)
Glucose-Capillary: 189 mg/dL — ABNORMAL HIGH (ref 70–99)
Glucose-Capillary: 131 mg/dL — ABNORMAL HIGH (ref 70–99)

## 2021-12-30 MED ORDER — OXYCODONE HCL 5 MG PO TABS
15.0000 mg | ORAL_TABLET | ORAL | Status: DC | PRN
  Administered 2021-12-30 – 2021-12-31 (×4): 15 mg via ORAL
  Filled 2021-12-30 (×4): qty 3

## 2021-12-30 MED ORDER — METHOCARBAMOL 500 MG PO TABS
500.0000 mg | ORAL_TABLET | Freq: Three times a day (TID) | ORAL | Status: DC | PRN
  Administered 2021-12-30 – 2022-01-07 (×12): 500 mg via ORAL
  Filled 2021-12-30 (×12): qty 1

## 2021-12-30 MED ORDER — CYCLOBENZAPRINE HCL 5 MG PO TABS
7.5000 mg | ORAL_TABLET | Freq: Three times a day (TID) | ORAL | Status: DC
  Administered 2021-12-30 – 2022-01-08 (×19): 7.5 mg via ORAL
  Filled 2021-12-30 (×7): qty 2
  Filled 2021-12-30: qty 1.5
  Filled 2021-12-30 (×14): qty 2

## 2021-12-30 NOTE — Progress Notes (Signed)
Pt refused lab draw saying she doesn't want to be bothered at this time. Lab tech informed to try later when pt awake

## 2021-12-30 NOTE — Progress Notes (Signed)
Received patient in bed to unit.  Alert and oriented.  Informed consent signed and in chart.   Treatment initiated: 0726 Treatment completed: 1126  Patient tolerated well.  Transported back to the room  Alert, without acute distress.  Hand-off given to patient's nurse.   Access used: AVF Access issues: No  Total UF removed: 4000 Medication(s) given: N/A Post HD VS: 117/56, 66, 13, 98%RA Post HD weight: 92.6kgs   [facility]

## 2021-12-30 NOTE — Progress Notes (Signed)
PROGRESS NOTE    [name]  KWI:[id number] DOB: 05-25-1965 DOA: 08/26/2021 PCP: [name], MD   Brief Narrative:   56 year old female with history of hypertension, hyperlipidemia, diabetes mellitus type 2, end-stage renal disease on hemodialysis, left BKA for chronic calcaneus osteomyelitis on 01/12/2020, bipolar disorder, left distal femoral fracture after a fall on 07/19/2021 deemed  nonoperative, admission from 08/03/2021-08/23/2021 for left humeral osteomyelitis requiring partial resection of femur and rearrangements of bony fragments with subsequent antibiotic treatment presented with left hip bleeding and was found to have subacute osteomyelitis and abscess of left femur.  Orthopedic/ID were consulted.  She was started on broad-spectrum antibiotics. She underwent multiple I&D's by Dr. [name] along with local tissue rearrangement. She was also seen by vascular surgery who did not fistula branch ligation.  She has completed 6-week course of daptomycin on 10/09/2021.  Discharge is pending ability for patient to tolerate hemodialysis in a chair.     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  Active Problems:   ESRD (end stage renal disease) on dialysis ([place])   Type 2 diabetes mellitus with hyperlipidemia (HCC)   History of CVA (cerebrovascular accident)   Essential hypertension   Anxiety and depression   GASTROESOPHAGEAL REFLUX, NO ESOPHAGITIS   TOBACCO DEPENDENCE   Hyponatremia   Obesity (BMI 30.0-34.9)   Unilateral amputation of left foot (HCC)   Wound infection   History of CVA (cerebrovascular accident) without residual deficits   Hyperkalemia   Wound dehiscence, surgical, sequela   Subacute osteomyelitis of left femur with abscess: -Status post surgical intervention by Dr. [name].  Completed 6 weeks of daptomycin on 8/9 and was supposed to follow-up outpatient with Dr. [name] in 1 week thereafter.  Reimaging performed on 10/2 showed  healing fractures with callus.  Continue pain management at this time.  Seen by Dr. [name] again 10/28, looks to be doing well,No further left hip restriction.  She has completed 6-week course of daptomycin on 10/09/2021.   I will go ahead and make she Narcotics/Muscle relaxer less frequent as she continues to take them without any intention to participate in mobility or attempt to get out of bed. If she shows initiative we can further work on pain meds. She has to sit in the HD chair.    Right thigh mild cellulitis: Completed 5-day course of antibiotics on 12/18/2021.   Symptoms most likely from anasarca.    History of left intertrochanteric fracture: Left hip hardware removed on 08/10/2021 prior to admission. Continue adequate pain control.   End-stage renal disease on hemodialysis: She has generalized anasarca.  Nephrology following Tells me not able to sit in the chair due to left hip pain.    Hyponatremia/hyperphosphatemia Management with hemodialysis On sevelamer   Anemia of chronic disease H&H currently stable.  Monitor intermittently   Type 2 diabetes with hyperglycemia Continue long-acting insulin increase 9 units, NovoLog with meals along with CBGs with SSI.  Off-and-on has hyperglycemia, eating junk food brought in by family.  I have advised nursing staff to restrict this and she needs to follow strict diet in the hospital.   Essential hypertension: On Coreg, daily Lasix.  IV as needed ordered   History of unspecified CVA Hyperlipidemia Continue Lipitor 40 mg daily   GERD On Protonix   Tobacco use Tobacco cessation counseled.   Anxiety and depression: Continue amitriptyline and Cymbalta.   Goals of care discussion: Prognosis is guarded to poor.  Remains full  code.   Palliative care has already evaluated the patient during this hospitalization      DVT prophylaxis: Place TED hose Start: 09/06/21 1507 SCDs Start: 08/31/21 1058 Code Status: Full code Family  Communication:    Status is: Inpatient Ongoing safe disposition planning.  Difficult to place care until she is able to tolerate hemodialysis in chair.  Nephrology working with the patient Patient needs to follow strict diet in the hospital    Subjective: Tels me she will sit in HD chair if its later in the day.  No other complaints. Doesn't want to move much again today despite of taking all of her pain meds.   Examination: Constitutional: Not in acute distress Respiratory: Clear to auscultation bilaterally Cardiovascular: Normal sinus rhythm, no rubs Abdomen: Nontender nondistended good bowel sounds Musculoskeletal: No edema noted Skin: No rashes seen Neurologic: CN 2-12 grossly intact.  And nonfocal Psychiatric: Normal judgment and insight. Alert and oriented x 3. Normal mood.    Left upper extremity AV fistula External female catheter   Objective: Vitals:   12/30/21 0726 12/30/21 0730 12/30/21 0800 12/30/21 0830  BP: (!) 135/58 (!) 113/57 (!) 113/57 (!) 141/61  Pulse: (!) 55 61 61 61  Resp: 16 16 (!) 9 10  Temp:      TempSrc:      SpO2: 99% 98% 100% 97%  Weight:      Height:        Intake/Output Summary (Last 24 hours) at 12/30/2021 0846 Last data filed at 12/30/2021 7824 Gross per 24 hour  Intake 0 ml  Output 0 ml  Net 0 ml   Filed Weights   12/27/21 0721 12/27/21 1144 12/30/21 0720  Weight: 94.2 kg 91 kg 95.4 kg     Data Reviewed:   CBC: Recent Labs  Lab 12/25/21 0852 12/26/21 0801 12/27/21 0750 12/29/21 1041  WBC 8.1 7.4 6.7 6.4  HGB 9.1* 9.8* 9.4* 9.6*  HCT 27.3* 30.9* 27.7* 29.4*  MCV 86.1 87.3 83.9 85.7  PLT 224 225 229 235   Basic Metabolic Panel: Recent Labs  Lab 12/25/21 0852 12/26/21 0458 12/26/21 0801 12/28/21 1058 12/29/21 1041 12/30/21 0721  NA 128*  --  126* 128*  --  124*  K 4.4  --  4.4 4.7  --  5.1  CL 84*  --  87* 87*  --  83*  CO2 27  --  26 26  --  24  GLUCOSE 204*  --  163* 198*  --  140*  BUN 70*  --  67* 66*   --  101*  CREATININE 6.48*  --  6.62* 5.43*  --  7.18*  CALCIUM 9.4  --  9.5 9.4  --  9.0  MG  --  1.9 1.9  --  1.8  --   PHOS 5.6*  --   --  5.6*  --  6.5*   GFR: Estimated Creatinine Clearance: 11.2 mL/min (A) (by C-G formula based on SCr of 7.18 mg/dL (H)). Liver Function Tests: Recent Labs  Lab 12/25/21 0852 12/28/21 1058 12/30/21 0721  ALBUMIN 3.4* 3.7 3.4*   No results for input(s): "LIPASE", "AMYLASE" in the last 168 hours. No results for input(s): "AMMONIA" in the last 168 hours. Coagulation Profile: No results for input(s): "INR", "PROTIME" in the last 168 hours. Cardiac Enzymes: No results for input(s): "CKTOTAL", "CKMB", "CKMBINDEX", "TROPONINI" in the last 168 hours. BNP (last 3 results) No results for input(s): "PROBNP" in the last 8760 hours. HbA1C: No results  for input(s): "HGBA1C" in the last 72 hours. CBG: Recent Labs  Lab 12/28/21 2049 12/29/21 0736 12/29/21 1124 12/29/21 1525 12/29/21 2127  GLUCAP 180* 153* 128* 152* 122*   Lipid Profile: No results for input(s): "CHOL", "HDL", "LDLCALC", "TRIG", "CHOLHDL", "LDLDIRECT" in the last 72 hours. Thyroid Function Tests: No results for input(s): "TSH", "T4TOTAL", "FREET4", "T3FREE", "THYROIDAB" in the last 72 hours. Anemia Panel: No results for input(s): "VITAMINB12", "FOLATE", "FERRITIN", "TIBC", "IRON", "RETICCTPCT" in the last 72 hours. Sepsis Labs: No results for input(s): "PROCALCITON", "LATICACIDVEN" in the last 168 hours.  No results found for this or any previous visit (from the past 240 hour(s)).       Radiology Studies: No results found.      Scheduled Meds:  (feeding supplement) PROSource Plus  30 mL Oral BID BM   alosetron  1 mg Oral BID   amitriptyline  25 mg Oral QHS   atorvastatin  40 mg Oral q1800   carvedilol  3.125 mg Oral BID   Chlorhexidine Gluconate Cloth  6 each Topical Q0600   cyclobenzaprine  10 mg Oral TID   darbepoetin (ARANESP) injection - DIALYSIS  60 mcg  Subcutaneous Q Fri-1800   docusate sodium  200 mg Oral BID   DULoxetine  60 mg Oral Daily   famotidine  20 mg Oral Daily   fentaNYL  1 patch Transdermal Q72H   furosemide  80 mg Oral Daily   insulin aspart  0-9 Units Subcutaneous TID WC   insulin aspart  4 Units Subcutaneous TID WC   insulin glargine-yfgn  9 Units Subcutaneous QHS   multivitamin  1 tablet Oral QHS   pantoprazole  80 mg Oral Daily   polyethylene glycol  17 g Oral Daily   sevelamer carbonate  2,400 mg Oral TID WC   sodium zirconium cyclosilicate  10 g Oral Once per day on Sun Tue Thu Sat   Continuous Infusions:  albumin human     methocarbamol (ROBAXIN) IV       LOS: 126 days   Time spent= 35 mins    Aarionna Germer Arsenio Loader, MD [facility]  If 7PM-7AM, please contact night-coverage  12/30/2021, 8:46 AM

## 2021-12-30 NOTE — Progress Notes (Addendum)
[facility] Progress Note   Subjective:  Same issue. Not in recliner for HD. Volume overloaded  with low Na+. Same excuses however Dr. [name] says no restriction L hip.     Objective Vitals:   12/30/21 0730 12/30/21 0800 12/30/21 0830 12/30/21 0900  BP: (!) 113/57 (!) 113/57 (!) 141/61 (!) 129/54  Pulse: 61 61 61 61  Resp: 16 (!) 9 10 (!) 9  Temp:      TempSrc:      SpO2: 98% 100% 97% 99%  Weight:      Height:       Physical Exam General: Chronically ill appearing female in NAD Heart: S1,S2 No M/R/G SR on monitor Lungs: CTA Abdomen: + edema sides of abdomin, NABS Extremities: L BKA L thigh edema, 1+ RLE  Dialysis Access: L AVF cannulated   Additional Objective Labs: Basic Metabolic Panel: Recent Labs  Lab 12/25/21 0852 12/26/21 0801 12/28/21 1058 12/30/21 0721  NA 128* 126* 128* 124*  K 4.4 4.4 4.7 5.1  CL 84* 87* 87* 83*  CO2 27 26 26 24   GLUCOSE 204* 163* 198* 140*  BUN 70* 67* 66* 101*  CREATININE 6.48* 6.62* 5.43* 7.18*  CALCIUM 9.4 9.5 9.4 9.0  PHOS 5.6*  --  5.6* 6.5*   Liver Function Tests: Recent Labs  Lab 12/25/21 0852 12/28/21 1058 12/30/21 0721  ALBUMIN 3.4* 3.7 3.4*   No results for input(s): "LIPASE", "AMYLASE" in the last 168 hours. CBC: Recent Labs  Lab 12/25/21 0852 12/26/21 0801 12/27/21 0750 12/29/21 1041  WBC 8.1 7.4 6.7 6.4  HGB 9.1* 9.8* 9.4* 9.6*  HCT 27.3* 30.9* 27.7* 29.4*  MCV 86.1 87.3 83.9 85.7  PLT 224 225 229 210   Blood Culture    Component Value Date/Time   SDES TISSUE 08/28/2021 0917   SPECREQUEST LEFT THIGH TISSUE 08/28/2021 0917   CULT  08/28/2021 0917    MODERATE ENTEROCOCCUS FAECIUM VANCOMYCIN RESISTANT ENTEROCOCCUS ISOLATED NO ANAEROBES ISOLATED Sent to [facility] for further susceptibility testing. Performed at [facility], [place] [address], [place], [place] [address]    REPTSTATUS 09/02/2021 FINAL 08/28/2021 3810    Cardiac Enzymes: No results for input(s): "CKTOTAL", "CKMB",  "CKMBINDEX", "TROPONINI" in the last 168 hours. CBG: Recent Labs  Lab 12/28/21 2049 12/29/21 0736 12/29/21 1124 12/29/21 1525 12/29/21 2127  GLUCAP 180* 153* 128* 152* 122*   Iron Studies: No results for input(s): "IRON", "TIBC", "TRANSFERRIN", "FERRITIN" in the last 72 hours. @lablastinr3 @ Studies/Results: No results found. Medications:  albumin human     methocarbamol (ROBAXIN) IV      (feeding supplement) PROSource Plus  30 mL Oral BID BM   alosetron  1 mg Oral BID   amitriptyline  25 mg Oral QHS   atorvastatin  40 mg Oral q1800   carvedilol  3.125 mg Oral BID   Chlorhexidine Gluconate Cloth  6 each Topical Q0600   cyclobenzaprine  10 mg Oral TID   darbepoetin (ARANESP) injection - DIALYSIS  60 mcg Subcutaneous Q Fri-1800   docusate sodium  200 mg Oral BID   DULoxetine  60 mg Oral Daily   famotidine  20 mg Oral Daily   fentaNYL  1 patch Transdermal Q72H   furosemide  80 mg Oral Daily   insulin aspart  0-9 Units Subcutaneous TID WC   insulin aspart  4 Units Subcutaneous TID WC   insulin glargine-yfgn  9 Units Subcutaneous QHS   multivitamin  1 tablet Oral QHS   pantoprazole  80 mg Oral Daily   polyethylene glycol  17 g Oral Daily   sevelamer carbonate  2,400 mg Oral TID WC   sodium zirconium cyclosilicate  10 g Oral Once per day on Sun Tue Thu Sat     Dialysis Orders: MWF at [facility], 400/500, EDW 90kg, 2K/2Ca, LUE AVF - heparin 4000 unit IV bolus - No ESA   Assessment/Plan:   Recurrent L hip osteomyelitis: S/p 08/10/21 surgery (removal hip IMN which was placed 12/2019) with persistent VRE infection. Back to OR 08/28/21, 08/31/21 for L hip debridement /wound vac  (now removed).  Completed 6 wk course of IV Daptomycin on 10/12/21. Management per primary team. Per Dr. Jess Barters note 12/28/2021 "NO RESTRICTIONS L HIP".  Chronic L hip/residual limb pain: She does have fractures of L femoral neck (where hardware was removed) as well as distal femur Fx. Pain control  per primary.  ESRD: MWF HD on schedule.   Clotting during treatment, add 2000 unit mid run dose heparin + 4000 unit heparin bolus. Next HD 01/01/2022 Hyperkalemia: she is on scheduled lokelma 4 days a week  AVF dysfunction: S/p branch ligation 09/19/2021  Dr. Virl Cagey. AVF working well now. HTN/volume: BP stable on Coreg 6.25 mg BID (hold a.m. of HD) - d/c hydralazine 10/24.  Lower Coreg to 3.125mg  BID on 10/25.  Continue to attempt taper DOWN BP meds with UF on HD to prevent cramping.  Ongoing LE and flank edema noted. Hx large gains. Stressed fluid and sodium restrictions. Low sodium noted.  Continue UF as tolerated.  Anemia of ESRD: Hgb 9s stable. Tsat 54%.  Aranesp increased to 60q wk starting 10/20. Secondary HPTH: CorrCa slightly high, not on VDRA. Phos variable, usually 5-6. Continue Renvela as binder. Nutrition: Alb low, continue protein supplements.  Renal diet w/fluid restrictions.  DM2: Insulin per primary Dispo: SNF previously recommended, now she would like to go home on discharge even though HD unit has been clear that they cannot assist patient out of her car and into the facility. Patient is aware of this.  Not home HD candidate.  Dialyzed in recliner on 9/4 but signed off early due to pain, has declined to come in a recliner since then.Strongly discussed on the importance of increasing her mobility as much as possible while she is in her room in addition to following her ongoing fluid restriction. Also reinforced the importance of being in the chair for HD treatments to improve strength and mobility. Our goal is to dialyze her in the mornings so the patient has more time to work with PT in the afternoons (we will do our best to accommodate this as we still need to always consider the emergent cases).  Encouraged ongoing progress. Per Renal Navigator notes there are no clinics that will accept patient unless she will have HD in chair. Will continue to encourage use of chair for HD  Braian Tijerina H.  Dennette Faulconer NP-C 12/30/2021, 9:29 AM  [employer] [phone]

## 2021-12-31 DIAGNOSIS — M86252 Subacute osteomyelitis, left femur: Secondary | ICD-10-CM | POA: Diagnosis not present

## 2021-12-31 LAB — GLUCOSE, CAPILLARY
Glucose-Capillary: 105 mg/dL — ABNORMAL HIGH (ref 70–99)
Glucose-Capillary: 160 mg/dL — ABNORMAL HIGH (ref 70–99)
Glucose-Capillary: 107 mg/dL — ABNORMAL HIGH (ref 70–99)
Glucose-Capillary: 171 mg/dL — ABNORMAL HIGH (ref 70–99)

## 2021-12-31 MED ORDER — OXYCODONE HCL 5 MG PO TABS
5.0000 mg | ORAL_TABLET | Freq: Three times a day (TID) | ORAL | Status: DC | PRN
  Administered 2021-12-31 – 2022-01-01 (×2): 5 mg via ORAL
  Filled 2021-12-31 (×4): qty 1

## 2021-12-31 MED ORDER — CHLORHEXIDINE GLUCONATE CLOTH 2 % EX PADS: 6.0000 | MEDICATED_PAD | Freq: Every day | CUTANEOUS | Status: DC

## 2021-12-31 MED ORDER — OXYCODONE HCL 5 MG PO TABS: 10.0000 mg | ORAL_TABLET | Freq: Four times a day (QID) | ORAL | Status: DC | PRN

## 2021-12-31 NOTE — TOC Progression Note (Signed)
Transition of Care [facility]) - Initial/Assessment Note    Patient Details  Name: [name] MRN: [id number] Date of Birth: 12-08-1965  Transition of [facility]) CM/SW Contact:    Milinda Antis, LCSWA Phone Number: 12/31/2021, 11:13 AM  Clinical Narrative:                 TOC continuing to follow patient.  We are unable to find safe disposition as she is not willing to sit in the chair for dialysis.  Expected Discharge Plan: Skilled Nursing Facility Barriers to Discharge: Continued Medical Work up   Patient Goals and CMS Choice Patient states their goals for this hospitalization and ongoing recovery are:: To return home CMS Medicare.gov Compare Post Acute Care list provided to:: Patient Choice offered to / list presented to : NA  Expected Discharge Plan and Services Expected Discharge Plan: [place]   Discharge Planning Services: CM Consult   Living arrangements for the past 2 months: Single Family Home                                      Prior Living Arrangements/Services Living arrangements for the past 2 months: Single Family Home Lives with:: Adult Children Patient language and need for interpreter reviewed:: Yes Do you feel safe going back to the place where you live?: Yes      Need for Family Participation in Patient Care: Yes (Comment) Care giver support system in place?: Yes (comment) Current home services: DME ([employer], w/c) Criminal Activity/Legal Involvement Pertinent to Current Situation/Hospitalization: No - Comment as needed  Activities of Daily Living Home Assistive Devices/Equipment: Eyeglasses, Wheelchair ADL Screening (condition at time of admission) Patient's cognitive ability adequate to safely complete daily activities?: Yes Is the patient deaf or have difficulty hearing?: No Does the patient have difficulty seeing, even when wearing glasses/contacts?: No Does the patient have difficulty concentrating, remembering, or  making decisions?: No Patient able to express need for assistance with ADLs?: Yes Does the patient have difficulty dressing or bathing?: Yes Independently performs ADLs?: No Communication: Independent Dressing (OT): Independent Is this a change from baseline?: Pre-admission baseline Grooming: Independent Feeding: Independent Bathing: Needs assistance Is this a change from baseline?: Pre-admission baseline Toileting: Needs assistance Is this a change from baseline?: Pre-admission baseline In/Out Bed: Needs assistance, Dependent Is this a change from baseline?: Pre-admission baseline Walks in Home: Dependent Is this a change from baseline?: Pre-admission baseline Does the patient have difficulty walking or climbing stairs?: Yes Weakness of Legs: Both Weakness of Arms/Hands: Both  Permission Sought/Granted Permission sought to share information with : Case Manager, Family Supports Permission granted to share information with : Yes, Verbal Permission Granted              Emotional Assessment Appearance:: Appears stated age Attitude/Demeanor/Rapport: Engaged, Gracious Affect (typically observed): Accepting, Appropriate, Calm, Hopeful Orientation: : Oriented to Self, Oriented to Place, Oriented to  Time, Oriented to Situation Alcohol / Substance Use: Not Applicable Psych Involvement: No (comment)  Admission diagnosis:  Wound infection [T14.8XXA, L08.9] Abscess of left hip [L02.416] Patient Active Problem List   Diagnosis Date Noted   Wound dehiscence, surgical, sequela    Abscess of left hip 08/26/2021   History of CVA (cerebrovascular accident) 38/25/0539   Hardware complicating wound infection ([place])    Subacute osteomyelitis of left femur with abscess     Septic arthritis of hip ([place]) 07/30/2021  Skin abscess L hip 07/28/2021   HCAP (healthcare-associated pneumonia) 07/27/2021   Hyperkalemia 30/16/0109   Metabolic acidemia 32/35/5732   Closed fracture of left distal  femur ([place]) 07/22/2021   Type 2 diabetes mellitus with hyperlipidemia ([place]) 07/22/2021   Infection of superficial incisional surgical site after procedure 03/09/2020   Abscess    Hypoglycemia    Essential hypertension    Sleep disturbance    ESRD (end stage renal disease) on dialysis ([place]) 01/24/2020   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[place]) 01/23/2020   Hyponatremia    Constipation    Chronic osteomyelitis involving left ankle and foot ([place])    Ulcer of left foot with necrosis of bone ([place])    Wound infection 12/28/2019   History of [place] amputation of left foot ([place]) 12/25/2017   History of CVA (cerebrovascular accident) without residual deficits 07/04/2017   Cerebral thrombosis with cerebral infarction 04/08/2017   Right sided weakness 04/07/2017   Hyperhidrosis 09/01/2016   Migraine with aura and without status migrainosus, not intractable 07/28/2016   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[place]) 08/04/2014   CKD stage 3 due to type 2 diabetes mellitus ([place]) 06/27/2014   Vitamin D insufficiency 05/08/2014   Obesity (BMI 30.0-34.9) 05/08/2014   Unilateral amputation of left foot ([place]) 05/08/2014   Bursitis of left shoulder 02/14/2014   Neck pain 01/03/2014   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.23) 01/14/2013   Insomnia 08/04/2012   Diabetic neuropathy, painful ([place]) 08/04/2011   Anxiety and depression 05/16/2010   Female stress incontinence 11/01/2007   TOBACCO DEPENDENCE 04/30/2006   GASTROESOPHAGEAL REFLUX, NO ESOPHAGITIS 04/30/2006   Irritable bowel syndrome 04/30/2006   PCP:  [name], MD Pharmacy:   [facility], [place] [address] [place] [address] Phone: [phone] Fax: [phone]  [id number] - [place], [place] [address] [place] [address] Phone: [phone] Fax:  [phone]     Social Determinants of Health (SDOH) Interventions    Readmission Risk Interventions    07/26/2021    3:15 PM  Readmission Risk Prevention Plan  Transportation Screening Complete  PCP or Specialist Appt within 3-5 Days Complete  HRI or Home Care Consult Complete  Palliative Care Screening Not Applicable  Medication Review (RN Care Manager) Referral to Pharmacy

## 2021-12-31 NOTE — Progress Notes (Signed)
PROGRESS NOTE    [name]  SWF:[id number] DOB: [birth year] DOA: 08/26/2021 PCP: [name], MD   Brief Narrative:   56 year old female with history of hypertension, hyperlipidemia, diabetes mellitus type 2, end-stage renal disease on hemodialysis, left BKA for chronic calcaneus osteomyelitis on 01/12/2020, bipolar disorder, left distal femoral fracture after a fall on 07/19/2021 deemed  nonoperative, admission from 08/03/2021-08/23/2021 for left humeral osteomyelitis requiring partial resection of femur and rearrangements of bony fragments with subsequent antibiotic treatment presented with left hip bleeding and was found to have subacute osteomyelitis and abscess of left femur.  Orthopedic/ID were consulted.  She was started on broad-spectrum antibiotics. She underwent multiple I&D's by Dr. [name] along with local tissue rearrangement. She was also seen by vascular surgery who did not fistula branch ligation.  She has completed 6-week course of daptomycin on 10/09/2021.  Discharge is pending ability for patient to tolerate hemodialysis in a chair.     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  Active Problems:   ESRD (end stage renal disease) on dialysis ([place])   Type 2 diabetes mellitus with hyperlipidemia (HCC)   History of CVA (cerebrovascular accident)   Essential hypertension   Anxiety and depression   GASTROESOPHAGEAL REFLUX, NO ESOPHAGITIS   TOBACCO DEPENDENCE   Hyponatremia   Obesity (BMI 30.0-34.9)   Unilateral amputation of left foot (HCC)   Wound infection   History of CVA (cerebrovascular accident) without residual deficits   Hyperkalemia   Wound dehiscence, surgical, sequela   Subacute osteomyelitis of left femur with abscess: -Status post surgical intervention by Dr. [name].  Completed 6 weeks of daptomycin on 8/9 and was supposed to follow-up outpatient with Dr. [name] in 1 week thereafter.  Reimaging performed on 10/2 showed  healing fractures with callus.  Continue pain management at this time.  Seen by Dr. [name] again 10/28, looks to be doing well,No further left hip restriction.  She has completed 6-week course of daptomycin on 10/09/2021.   I went ahead and made narcotics/Muscle relaxer less frequent as she continues to take them without any intention to participate in mobility or attempt to get out of bed.  If she does still show any interest/initiated with mobility we can further work with her pain meds   Right thigh mild cellulitis: Completed 5-day course of antibiotics on 12/18/2021.   Symptoms most likely from anasarca.    History of left intertrochanteric fracture: Left hip hardware removed on 08/10/2021 prior to admission. Continue adequate pain control.   End-stage renal disease on hemodialysis: She has generalized anasarca.  Nephrology following Tells me not able to sit in the chair due to left hip pain.    Hyponatremia/hyperphosphatemia Management with hemodialysis On sevelamer   Anemia of chronic disease H&H currently stable.  Monitor intermittently   Type 2 diabetes with hyperglycemia Continue long-acting insulin increase 9 units, NovoLog with meals along with CBGs with SSI.  Off-and-on has hyperglycemia, eating junk food brought in by family.  I have advised nursing staff to restrict this and she needs to follow strict diet in the hospital.   Essential hypertension: On Coreg, daily Lasix.  IV as needed ordered   History of unspecified CVA Hyperlipidemia Continue Lipitor 40 mg daily   GERD On Protonix   Tobacco use Tobacco cessation counseled.   Anxiety and depression: Continue amitriptyline and Cymbalta.   Goals of care discussion: Prognosis is guarded to poor.  Remains full code.   Palliative  care has already evaluated the patient during this hospitalization      DVT prophylaxis: Place TED hose Start: 09/06/21 1507 SCDs Start: 08/31/21 1058 Code Status: Full code Family  Communication:    Status is: Inpatient Unable to find safe disposition as she is not willing to sit in the chair for dialysis Nephrology working with her    Subjective: Patient tells me this morning that she does not want to work with physical therapy and will only move and work with therapy at her own terms.  I discussed with her pain medication use and as she is comfortable I will be reducing her pain medication.  She tells me as it is pain medications are not helping her.  Examination: Constitutional: Not in acute distress Respiratory: Clear to auscultation bilaterally Cardiovascular: Normal sinus rhythm, no rubs Abdomen: Nontender nondistended good bowel sounds Musculoskeletal: No edema noted Skin: Left hip surgical site looks clean Neurologic: CN 2-12 grossly intact.  And nonfocal Psychiatric: Normal judgment and insight. Alert and oriented x 3. Normal mood.  Left upper extremity AV fistula External female catheter   Objective: Vitals:   12/30/21 1831 12/30/21 2148 12/31/21 0604 12/31/21 0815  BP: (!) 122/58 112/62 (!) 146/52 (!) 136/54  Pulse: 62 (!) 58 (!) 59 65  Resp: 13 18 18    Temp: 98.6 F (37 C) 98.1 F (36.7 C) 98.1 F (36.7 C) 98.6 F (37 C)  TempSrc: Oral Oral Oral Oral  SpO2: 99% 100% 100% 99%  Weight:      Height:        Intake/Output Summary (Last 24 hours) at 12/31/2021 0830 Last data filed at 12/31/2021 0200 Gross per 24 hour  Intake 0 ml  Output 4000 ml  Net -4000 ml   Filed Weights   12/27/21 1144 12/30/21 0720 12/30/21 1154  Weight: 91 kg 95.4 kg 92.6 kg     Data Reviewed:   CBC: Recent Labs  Lab 12/25/21 0852 12/26/21 0801 12/27/21 0750 12/29/21 1041  WBC 8.1 7.4 6.7 6.4  HGB 9.1* 9.8* 9.4* 9.6*  HCT 27.3* 30.9* 27.7* 29.4*  MCV 86.1 87.3 83.9 85.7  PLT 224 225 229 024   Basic Metabolic Panel: Recent Labs  Lab 12/25/21 0852 12/26/21 0458 12/26/21 0801 12/28/21 1058 12/29/21 1041 12/30/21 0721  NA 128*  --  126*  128*  --  124*  K 4.4  --  4.4 4.7  --  5.1  CL 84*  --  87* 87*  --  83*  CO2 27  --  26 26  --  24  GLUCOSE 204*  --  163* 198*  --  140*  BUN 70*  --  67* 66*  --  101*  CREATININE 6.48*  --  6.62* 5.43*  --  7.18*  CALCIUM 9.4  --  9.5 9.4  --  9.0  MG  --  1.9 1.9  --  1.8  --   PHOS 5.6*  --   --  5.6*  --  6.5*   GFR: Estimated Creatinine Clearance: 11 mL/min (A) (by C-G formula based on SCr of 7.18 mg/dL (H)). Liver Function Tests: Recent Labs  Lab 12/25/21 0852 12/28/21 1058 12/30/21 0721  ALBUMIN 3.4* 3.7 3.4*   No results for input(s): "LIPASE", "AMYLASE" in the last 168 hours. No results for input(s): "AMMONIA" in the last 168 hours. Coagulation Profile: No results for input(s): "INR", "PROTIME" in the last 168 hours. Cardiac Enzymes: No results for input(s): "CKTOTAL", "CKMB", "CKMBINDEX", "TROPONINI"  in the last 168 hours. BNP (last 3 results) No results for input(s): "PROBNP" in the last 8760 hours. HbA1C: No results for input(s): "HGBA1C" in the last 72 hours. CBG: Recent Labs  Lab 12/29/21 2127 12/30/21 1409 12/30/21 1518 12/30/21 2148 12/31/21 0724  GLUCAP 122* 195* 189* 131* 105*   Lipid Profile: No results for input(s): "CHOL", "HDL", "LDLCALC", "TRIG", "CHOLHDL", "LDLDIRECT" in the last 72 hours. Thyroid Function Tests: No results for input(s): "TSH", "T4TOTAL", "FREET4", "T3FREE", "THYROIDAB" in the last 72 hours. Anemia Panel: No results for input(s): "VITAMINB12", "FOLATE", "FERRITIN", "TIBC", "IRON", "RETICCTPCT" in the last 72 hours. Sepsis Labs: No results for input(s): "PROCALCITON", "LATICACIDVEN" in the last 168 hours.  No results found for this or any previous visit (from the past 240 hour(s)).       Radiology Studies: No results found.      Scheduled Meds:  (feeding supplement) PROSource Plus  30 mL Oral BID BM   alosetron  1 mg Oral BID   amitriptyline  25 mg Oral QHS   atorvastatin  40 mg Oral q1800   carvedilol   3.125 mg Oral BID   Chlorhexidine Gluconate Cloth  6 each Topical Q0600   cyclobenzaprine  7.5 mg Oral TID   darbepoetin (ARANESP) injection - DIALYSIS  60 mcg Subcutaneous Q Fri-1800   docusate sodium  200 mg Oral BID   DULoxetine  60 mg Oral Daily   famotidine  20 mg Oral Daily   fentaNYL  1 patch Transdermal Q72H   furosemide  80 mg Oral Daily   insulin aspart  0-9 Units Subcutaneous TID WC   insulin aspart  4 Units Subcutaneous TID WC   insulin glargine-yfgn  9 Units Subcutaneous QHS   multivitamin  1 tablet Oral QHS   pantoprazole  80 mg Oral Daily   polyethylene glycol  17 g Oral Daily   sevelamer carbonate  2,400 mg Oral TID WC   sodium zirconium cyclosilicate  10 g Oral Once per day on Sun Tue Thu Sat   Continuous Infusions:  albumin human       LOS: 127 days   Time spent= 35 mins    Eliezer Khawaja Arsenio Loader, MD [facility]  If 7PM-7AM, please contact night-coverage  12/31/2021, 8:30 AM

## 2021-12-31 NOTE — Progress Notes (Signed)
Mobility Specialist Progress Note:   12/31/21 1630  Mobility  Activity Transferred from chair to bed  Level of Assistance Minimal assist, patient does 75% or more  Assistive Device Sliding board  Activity Response Tolerated well  Mobility Referral Yes  $Mobility charge 1 Mobility   Pt agreeable to transfer back to bed. Required only minA to place sliding board, and minG for scooting. Pt left with all needs met.   Nelta Numbers Acute Rehab Secure Chat or Office Phone: [phone]

## 2021-12-31 NOTE — Progress Notes (Signed)
[facility] Progress Note  Subjective: Seen in room. Has multiple sodas on bedside table. She says they are from dinner last night and they are for "her daughter". Told her I would take them with me and she could call when she was ready for fluids or her daughter was here. Nursing staff is on board with fluid restrictoins. Asked if she'd sit in chair tomorrow, she said she'd "think about it". Says she has been getting up and staying up in chair but staff says this isn't true.   Objective Vitals:   12/30/21 1831 12/30/21 2148 12/31/21 0604 12/31/21 0815  BP: (!) 122/58 112/62 (!) 146/52 (!) 136/54  Pulse: 62 (!) 58 (!) 59 65  Resp: 13 18 18    Temp: 98.6 F (37 C) 98.1 F (36.7 C) 98.1 F (36.7 C) 98.6 F (37 C)  TempSrc: Oral Oral Oral Oral  SpO2: 99% 100% 100% 99%  Weight:      Height:       Physical Exam General: Chronically ill appearing female in NAD Heart: S1,S2 No M/R/G SR on monitor Lungs: CTA Abdomen: + edema sides of abdomin, NABS Extremities: L BKA L thigh edema, 1+ RLE  Dialysis Access: L AVF cannulated   Additional Objective Labs: Basic Metabolic Panel: Recent Labs  Lab 12/25/21 0852 12/26/21 0801 12/28/21 1058 12/30/21 0721  NA 128* 126* 128* 124*  K 4.4 4.4 4.7 5.1  CL 84* 87* 87* 83*  CO2 27 26 26 24   GLUCOSE 204* 163* 198* 140*  BUN 70* 67* 66* 101*  CREATININE 6.48* 6.62* 5.43* 7.18*  CALCIUM 9.4 9.5 9.4 9.0  PHOS 5.6*  --  5.6* 6.5*   Liver Function Tests: Recent Labs  Lab 12/25/21 0852 12/28/21 1058 12/30/21 0721  ALBUMIN 3.4* 3.7 3.4*   No results for input(s): "LIPASE", "AMYLASE" in the last 168 hours. CBC: Recent Labs  Lab 12/25/21 0852 12/26/21 0801 12/27/21 0750 12/29/21 1041  WBC 8.1 7.4 6.7 6.4  HGB 9.1* 9.8* 9.4* 9.6*  HCT 27.3* 30.9* 27.7* 29.4*  MCV 86.1 87.3 83.9 85.7  PLT 224 225 229 210   Blood Culture    Component Value Date/Time   SDES TISSUE 08/28/2021 0917   SPECREQUEST LEFT THIGH TISSUE  08/28/2021 0917   CULT  08/28/2021 0917    MODERATE ENTEROCOCCUS FAECIUM VANCOMYCIN RESISTANT ENTEROCOCCUS ISOLATED NO ANAEROBES ISOLATED Sent to [facility] for further susceptibility testing. Performed at [facility], [place] [address], [place], [place] [address]    REPTSTATUS 09/02/2021 FINAL 08/28/2021 3419    Cardiac Enzymes: No results for input(s): "CKTOTAL", "CKMB", "CKMBINDEX", "TROPONINI" in the last 168 hours. CBG: Recent Labs  Lab 12/30/21 1409 12/30/21 1518 12/30/21 2148 12/31/21 0724 12/31/21 1130  GLUCAP 195* 189* 131* 105* 160*   Iron Studies: No results for input(s): "IRON", "TIBC", "TRANSFERRIN", "FERRITIN" in the last 72 hours. @lablastinr3 @ Studies/Results: No results found. Medications:  albumin human      (feeding supplement) PROSource Plus  30 mL Oral BID BM   alosetron  1 mg Oral BID   amitriptyline  25 mg Oral QHS   atorvastatin  40 mg Oral q1800   carvedilol  3.125 mg Oral BID   Chlorhexidine Gluconate Cloth  6 each Topical Q0600   cyclobenzaprine  7.5 mg Oral TID   darbepoetin (ARANESP) injection - DIALYSIS  60 mcg Subcutaneous Q Fri-1800   docusate sodium  200 mg Oral BID   DULoxetine  60 mg Oral Daily   famotidine  20 mg Oral Daily   fentaNYL  1 patch Transdermal Q72H   furosemide  80 mg Oral Daily   insulin aspart  0-9 Units Subcutaneous TID WC   insulin aspart  4 Units Subcutaneous TID WC   insulin glargine-yfgn  9 Units Subcutaneous QHS   multivitamin  1 tablet Oral QHS   pantoprazole  80 mg Oral Daily   polyethylene glycol  17 g Oral Daily   sevelamer carbonate  2,400 mg Oral TID WC   sodium zirconium cyclosilicate  10 g Oral Once per day on Sun Tue Thu Sat     Dialysis Orders: MWF at [place], 400/500, EDW 90kg, 2K/2Ca, LUE AVF - heparin 4000 unit IV bolus - No ESA   Assessment/Plan:   Recurrent L hip osteomyelitis: S/p 08/10/21 surgery (removal hip IMN which was placed 12/2019) with persistent VRE infection.  Back to OR 08/28/21, 08/31/21 for L hip debridement /wound vac  (now removed).  Completed 6 wk course of IV Daptomycin on 10/12/21. Management per primary team. Per Dr. Jess Barters note 12/28/2021 "NO RESTRICTIONS L HIP".  Chronic L hip/residual limb pain: She does have fractures of L femoral neck (where hardware was removed) as well as distal femur Fx. Pain control per primary.  ESRD: MWF HD on schedule.   Clotting during treatment, add 2000 unit mid run dose heparin + 4000 unit heparin bolus. Next HD 01/01/2022 Hyperkalemia: she is on scheduled lokelma 4 days a week  AVF dysfunction: S/p branch ligation 09/19/2021  Dr. Virl Cagey. AVF working well now. HTN/volume: BP stable on Coreg 6.25 mg BID (hold a.m. of HD) - d/c hydralazine 10/24.  Lower Coreg to 3.125mg  BID on 10/25.  Continue to attempt taper DOWN BP meds with UF on HD to prevent cramping.  Ongoing LE and flank edema noted. Hx large gains. Stressed fluid and sodium restrictions. Low sodium noted. Clearly still volume overloaded. Continue UF as tolerated.  Anemia of ESRD: Hgb 9s stable. Tsat 54%.  Aranesp increased to 60q wk starting 10/20. Secondary HPTH: CorrCa slightly high, not on VDRA. Phos variable, usually 5-6. Continue Renvela as binder. Nutrition: Alb low, continue protein supplements.  Renal diet w/fluid restrictions.  DM2: Insulin per primary Dispo: SNF previously recommended, now she would like to go home on discharge even though HD unit has been clear that they cannot assist patient out of her car and into the facility. Patient is aware of this.  Not home HD candidate.  Dialyzed in recliner on 9/4 but signed off early due to pain, has declined to come in a recliner since then.Strongly discussed on the importance of increasing her mobility as much as possible while she is in her room in addition to following her ongoing fluid restriction. Also reinforced the importance of being in the chair for HD treatments to improve strength and mobility. Our  goal is to dialyze her in the mornings so the patient has more time to work with PT in the afternoons (we will do our best to accommodate this as we still need to always consider the emergent cases).  Encouraged ongoing progress. Per Renal Navigator notes there are no clinics that will accept patient unless she will have HD in chair. Will continue to encourage use of chair for HD  Arpita Fentress H. Meriam Chojnowski NP-C 12/31/2021, 11:46 AM  [employer] [phone]

## 2021-12-31 NOTE — Progress Notes (Signed)
Physical Therapy Treatment Patient Details Name: [name] MRN: [id number] DOB: 1965-06-30 Today's Date: 12/31/2021   History of Present Illness 56 y.o. female presented to ED 6/26 from dialysis with increased bloody drainage from L hip wound. 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 s/p hardware removal of left hip with partial resection of tuft tissue and femure that were nonviable on 08/10/21 Discharged home 6/23. underwent L hip debridement and wound vac placement 6/28; +Left intertrochanteric non-union,  Fracture of left distal femur  PMH: hypertension, hyperlipidemia, ESRD on HD MWF, history of left BKA in 2021, depression/anxiety, stroke, tobacco use, T2DM,  insomnia, chronic pain syndrome,    PT Comments    Continuing work on functional mobility and activity tolerance;  Session focused on L hip and knee stretching with prosthesis partially donned for a more effective lever arm; performed L hip IR stretching towards neutral hip and knee extension stretching seated EOB;   Discussed the benefit of donning L prosthesis (even if it doesn't fully "click in") daily, so that it keeps her LLE in a more functional position (eventually might consider the "bone foam" knee extension stretching device typically used post TKAs), and out of the knee flexed, hip externally rotated position; pt had questions, explained rationale; Questions solicited and answered;   Assisted pt OOB to recliner via lateral scoot with sliding board; recliner cushioned with yellow "egg crate" foam; Encouraged pt to adjust inclination of back of recliner as needed for comfort to incr time in recliner;   Discussed with pt,  as much as possible, that she is physically capable of more mobility and activity tolerance than she seems to think; reiterated to her that the body responds and adjusts to the stresses placed upon it, and that it is time to lean into hard and uncomfortable tasks to  progress towards her stated goal of independence; She seems to be limiting herself . . . Is it possible to get counseling for [name]? Perhaps via telehealth?  Recommend obtaining a drop-arm bedside commode for pt to use, and requested that a family member bring in her wheelchair to use;  Will also consider re-consulting her prosthetist to re-fit her LLE prosthesis (she got her prosthesis from [facility])    Recommendations for follow up therapy are one component of a multi-disciplinary discharge planning process, led by the attending physician.  Recommendations may be updated based on patient status, additional functional criteria and insurance authorization.  Follow Up Recommendations  Skilled nursing-short term rehab (<3 hours/day) (pt currently refusing due to inability to tolerate sitting in chair for 6+ hours as transported to/from dialysis) Can patient physically be transported by private vehicle: No   Assistance Recommended at Discharge Intermittent Supervision/Assistance  Patient can return home with the following A lot of help with bathing/dressing/bathroom;Assistance with cooking/housework;Assist for transportation;Two people to help with walking and/or transfers;Help with stairs or ramp for entrance   Equipment Recommendations   (sliding board)    Recommendations for Other Services       Precautions / Restrictions Precautions Precautions: Fall;Other (comment) Precaution Comments: L BKA (baseline) Restrictions LLE Weight Bearing: Weight bearing as tolerated Other Position/Activity Restrictions: L distal femur fx; per Dr [name] 8/10 pt can full WB     Mobility  Bed Mobility Overal bed mobility: Needs Assistance Bed Mobility: Supine to Sit     Supine to sit: Modified independent (Device/Increase time), Min assist     General bed mobility comments: HOB elevated. No cues provided  to see patient's problem solving. got to almost fully upright sitting, and leaned back down  to R; min handheld assist to come fully to sitting    Transfers Overall transfer level: Needs assistance Equipment used: Sliding board Transfers: Bed to chair/wheelchair/BSC            Lateral/Scoot Transfers: Min assist General transfer comment: Reviewed head/hips relationship with verbal and demo cues; scooted to pt's R along side of bed (continued difficulty scooting on cotton bedsheets); tehn perfomed lateral scoot with sliding board bed to recliner to pt's R; better scoot with sliding board, though needing multple scoots to get in chair    Ambulation/Gait                   Stairs             Wheelchair Mobility    Modified Rankin (Stroke Patients Only)       Balance                                            Cognition Arousal/Alertness: Awake/alert Behavior During Therapy: WFL for tasks assessed/performed Overall Cognitive Status: Within Functional Limits for tasks assessed                                          Exercises Other Exercises Other Exercises: L hip internal rotation stretch towards neutral hip, seated EOB with prosthesis on; 3 bouts Other Exercises: knee extension stretch seated EOB with prosthesis partially donned for larger lever arm    General Comments General comments (skin integrity, edema, etc.): Donned prosthesis liner with incr time; sitting EOB, might be easier to don laying down with LLE lifted up; partially donned prosthesis, though unable to get it fully on; Prosthesis provided a better lever arm to stretch hip and knee with      Pertinent Vitals/Pain Pain Assessment Pain Assessment: Faces Faces Pain Scale: Hurts even more Pain Location: L hip/LLE with stretching into internal rotation towards neutral and knee extension stretching (prosthesis on, but not "clicked in") Pain Descriptors / Indicators: Discomfort, Grimacing, Guarding Pain Intervention(s): Monitored during session     Home Living                          Prior Function            PT Goals (current goals can now be found in the care plan section) Acute Rehab PT Goals Patient Stated Goal: less pain with movement PT Goal Formulation: With patient Time For Goal Achievement: 01/09/22 Potential to Achieve Goals: Fair Progress towards PT goals: Progressing toward goals (slowly, but a bit promising that pt agreed to try donning prothesis)    Frequency    Min 2X/week      PT Plan Current plan remains appropriate    Co-evaluation              AM-PAC PT "6 Clicks" Mobility   Outcome Measure  Help needed turning from your back to your side while in a flat bed without using bedrails?: None Help needed moving from lying on your back to sitting on the side of a flat bed without using bedrails?: None Help needed moving to and from a bed to a  chair (including a wheelchair)?: A Little Help needed standing up from a chair using your arms (e.g., wheelchair or bedside chair)?: Total Help needed to walk in hospital room?: Total Help needed climbing 3-5 steps with a railing? : Total 6 Click Score: 14    End of Session Equipment Utilized During Treatment: Other (comment) (prosthesis partially donned) Activity Tolerance: Patient tolerated treatment well Patient left: in chair;with call bell/phone within reach;Other (comment) (yellow egg crate cushion in seat) Nurse Communication: Mobility status PT Visit Diagnosis: Other abnormalities of gait and mobility (R26.89);Muscle weakness (generalized) (M62.81);History of falling (Z91.81);Pain Pain - Right/Left: Left Pain - part of body: Leg     Time: 3428-7681 PT Time Calculation (min) (ACUTE ONLY): 51 min  Charges:  $Therapeutic Exercise: 8-22 mins $Therapeutic Activity: 23-37 mins $Self Care/Home Management: [place] [facility] [phone]    Colletta [place] 12/31/2021, 6:23 PM

## 2022-01-01 DIAGNOSIS — Z992 Dependence on renal dialysis: Secondary | ICD-10-CM | POA: Diagnosis not present

## 2022-01-01 DIAGNOSIS — M86252 Subacute osteomyelitis, left femur: Secondary | ICD-10-CM | POA: Diagnosis not present

## 2022-01-01 DIAGNOSIS — N186 End stage renal disease: Secondary | ICD-10-CM | POA: Diagnosis not present

## 2022-01-01 LAB — GLUCOSE, CAPILLARY
Glucose-Capillary: 134 mg/dL — ABNORMAL HIGH (ref 70–99)
Glucose-Capillary: 116 mg/dL — ABNORMAL HIGH (ref 70–99)
Glucose-Capillary: 198 mg/dL — ABNORMAL HIGH (ref 70–99)
Glucose-Capillary: 192 mg/dL — ABNORMAL HIGH (ref 70–99)

## 2022-01-01 LAB — CBC
HCT: 29.9 % — ABNORMAL LOW (ref 36.0–46.0)
Hemoglobin: 9.6 g/dL — ABNORMAL LOW (ref 12.0–15.0)
MCH: 28.1 pg (ref 26.0–34.0)
MCHC: 32.1 g/dL (ref 30.0–36.0)
MCV: 87.4 fL (ref 80.0–100.0)
Platelets: 201 10*3/uL (ref 150–400)
RBC: 3.42 MIL/uL — ABNORMAL LOW (ref 3.87–5.11)
RDW: 17.2 % — ABNORMAL HIGH (ref 11.5–15.5)
WBC: 9.8 10*3/uL (ref 4.0–10.5)
nRBC: 0 % (ref 0.0–0.2)

## 2022-01-01 LAB — BASIC METABOLIC PANEL
Anion gap: 17 — ABNORMAL HIGH (ref 5–15)
BUN: 69 mg/dL — ABNORMAL HIGH (ref 6–20)
CO2: 25 mmol/L (ref 22–32)
Calcium: 9.7 mg/dL (ref 8.9–10.3)
Chloride: 85 mmol/L — ABNORMAL LOW (ref 98–111)
Creatinine, Ser: 6.23 mg/dL — ABNORMAL HIGH (ref 0.44–1.00)
GFR, Estimated: 7 mL/min — ABNORMAL LOW (ref >60)
Glucose, Bld: 150 mg/dL — ABNORMAL HIGH (ref 70–99)
Potassium: 4.8 mmol/L (ref 3.5–5.1)
Sodium: 127 mmol/L — ABNORMAL LOW (ref 135–145)

## 2022-01-01 LAB — MAGNESIUM: Magnesium: 1.8 mg/dL (ref 1.7–2.4)

## 2022-01-01 LAB — PHOSPHORUS: Phosphorus: 5.3 mg/dL — ABNORMAL HIGH (ref 2.5–4.6)

## 2022-01-01 MED ORDER — DARBEPOETIN ALFA 100 MCG/0.5ML IJ SOSY
100.0000 ug | PREFILLED_SYRINGE | INTRAMUSCULAR | Status: DC
  Administered 2022-01-03 – 2022-01-24 (×4): 100 ug via SUBCUTANEOUS
  Filled 2022-01-01 (×4): qty 0.5

## 2022-01-01 MED ORDER — HEPARIN SODIUM (PORCINE) 1000 UNIT/ML IJ SOLN
INTRAMUSCULAR | Status: AC
  Filled 2022-01-01: qty 4

## 2022-01-01 NOTE — Progress Notes (Signed)
Pt off the floor in hemodialysis

## 2022-01-01 NOTE — Progress Notes (Signed)
Reduced blood flow rate due to high ap will monitor closely

## 2022-01-01 NOTE — Progress Notes (Signed)
[facility] Progress Note   Subjective: Appears comfortable on HD. Says she has her prothesis and was able to get stretcher on. She is working with [facility] this week and she is showing interest today for 1st time in months.    Objective Vitals:   01/01/22 0600 01/01/22 0830 01/01/22 0832 01/01/22 0900  BP: 120/82 (!) 160/50 (!) 146/50 (!) 157/53  Pulse: 67 66 (!) 59 62  Resp: 18 (!) 21 17 18   Temp: 98.5 F (36.9 C) 98.2 F (36.8 C)    TempSrc: Oral Oral    SpO2: 100% 100% 100% 99%  Weight:  95.4 kg    Height:       Physical Exam General: Chronically ill appearing female in NAD Heart: S1,S2 No M/R/G SR on monitor Lungs: CTA Abdomen: + edema sides of abdomin, NABS Extremities: L BKA L thigh edema, 1+ RLE  Dialysis Access: L AVF cannulated   Additional Objective Labs: Basic Metabolic Panel: Recent Labs  Lab 12/26/21 0801 12/28/21 1058 12/30/21 0721  NA 126* 128* 124*  K 4.4 4.7 5.1  CL 87* 87* 83*  CO2 26 26 24   GLUCOSE 163* 198* 140*  BUN 67* 66* 101*  CREATININE 6.62* 5.43* 7.18*  CALCIUM 9.5 9.4 9.0  PHOS  --  5.6* 6.5*   Liver Function Tests: Recent Labs  Lab 12/28/21 1058 12/30/21 0721  ALBUMIN 3.7 3.4*   No results for input(s): "LIPASE", "AMYLASE" in the last 168 hours. CBC: Recent Labs  Lab 12/26/21 0801 12/27/21 0750 12/29/21 1041 01/01/22 0813  WBC 7.4 6.7 6.4 9.8  HGB 9.8* 9.4* 9.6* 9.6*  HCT 30.9* 27.7* 29.4* 29.9*  MCV 87.3 83.9 85.7 87.4  PLT 225 229 210 201   Blood Culture    Component Value Date/Time   SDES TISSUE 08/28/2021 0917   SPECREQUEST LEFT THIGH TISSUE 08/28/2021 0917   CULT  08/28/2021 0917    MODERATE ENTEROCOCCUS FAECIUM VANCOMYCIN RESISTANT ENTEROCOCCUS ISOLATED NO ANAEROBES ISOLATED Sent to [place] for further susceptibility testing. Performed at [facility], [place] [address], [place], [place] [address]    REPTSTATUS 09/02/2021 FINAL 08/28/2021 8315    Cardiac Enzymes: No results  for input(s): "CKTOTAL", "CKMB", "CKMBINDEX", "TROPONINI" in the last 168 hours. CBG: Recent Labs  Lab 12/31/21 0724 12/31/21 1130 12/31/21 1645 12/31/21 2037 01/01/22 0744  GLUCAP 105* 160* 107* 171* 134*   Iron Studies: No results for input(s): "IRON", "TIBC", "TRANSFERRIN", "FERRITIN" in the last 72 hours. @lablastinr3 @ Studies/Results: No results found. Medications:  albumin human      (feeding supplement) PROSource Plus  30 mL Oral BID BM   alosetron  1 mg Oral BID   amitriptyline  25 mg Oral QHS   atorvastatin  40 mg Oral q1800   carvedilol  3.125 mg Oral BID   Chlorhexidine Gluconate Cloth  6 each Topical Q0600   cyclobenzaprine  7.5 mg Oral TID   darbepoetin (ARANESP) injection - DIALYSIS  60 mcg Subcutaneous Q Fri-1800   docusate sodium  200 mg Oral BID   DULoxetine  60 mg Oral Daily   famotidine  20 mg Oral Daily   fentaNYL  1 patch Transdermal Q72H   furosemide  80 mg Oral Daily   insulin aspart  0-9 Units Subcutaneous TID WC   insulin aspart  4 Units Subcutaneous TID WC   insulin glargine-yfgn  9 Units Subcutaneous QHS   multivitamin  1 tablet Oral QHS   pantoprazole  80 mg Oral Daily  polyethylene glycol  17 g Oral Daily   sevelamer carbonate  2,400 mg Oral TID WC   sodium zirconium cyclosilicate  10 g Oral Once per day on Sun Tue Thu Sat     Dialysis Orders: MWF at [facility], 400/500, EDW 90kg, 2K/2Ca, LUE AVF - heparin 4000 unit IV bolus - No ESA   Assessment/Plan:   Recurrent L hip osteomyelitis: S/p 08/10/21 surgery (removal hip IMN which was placed 12/2019) with persistent VRE infection. Back to OR 08/28/21, 08/31/21 for L hip debridement /wound vac  (now removed).  Completed 6 wk course of IV Daptomycin on 10/12/21. Management per primary team. Per Dr. Jess Barters note 12/28/2021 "NO RESTRICTIONS L HIP".  Chronic L hip/residual limb pain: She does have fractures of L femoral neck (where hardware was removed) as well as distal femur Fx. Pain  control per primary.  ESRD: MWF HD on schedule.   Clotting during treatment, add 2000 unit mid run dose heparin + 4000 unit heparin bolus. 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 Virl Cagey. AVF working well now. HTN/volume: BP stable on Coreg 6.25 mg BID (hold a.m. of HD) - d/c hydralazine 10/24.  Lower Coreg to 3.125mg  BID on 10/25.  Continue to attempt taper DOWN BP meds with UF on HD to prevent cramping.  Ongoing LE and flank edema noted. Hx large gains. Stressed fluid and sodium restrictions. Low sodium noted. Clearly still volume overloaded. Continue UF as tolerated.  Anemia of ESRD: Hgb 9s stable. Tsat 54%.  Aranesp increased to 100 q wk starting 01/03/22. Check iron panel with labs 01/03/2022. Secondary HPTH: CorrCa slightly high, not on VDRA. Phos variable, usually 5-6. Continue Renvela as binder. Nutrition: Alb low, continue protein supplements.  Renal diet w/fluid restrictions.  DM2: Insulin per primary Dispo: SNF previously recommended, now she would like to go home on discharge even though HD unit has been clear that they cannot assist patient out of her car and into the facility. Patient is aware of this.  Not home HD candidate.  Dialyzed in recliner on 9/4 but signed off early due to pain, has declined to come in a recliner since then.Strongly discussed on the importance of increasing her mobility as much as possible while she is in her room in addition to following her ongoing fluid restriction. Also reinforced the importance of being in the chair for HD treatments to improve strength and mobility. Our goal is to dialyze her in the mornings so the patient has more time to work with PT in the afternoons (we will do our best to accommodate this as we still need to always consider the emergent cases).  Encouraged ongoing progress. Per Renal Navigator notes there are no clinics that will accept patient unless she  will have HD in chair. Will continue to encourage use of chair for HD  Donna Winters H. Cherish Runde NP-C 01/01/2022, 9:01 AM  [employer] [phone]

## 2022-01-01 NOTE — Progress Notes (Signed)
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  Orthopedic/ID were consulted.  She was started on broad-spectrum antibiotics.  Underwent multiple I&D's by Dr. [name] along with local tissue rearrangement.  She was also seen by vascular surgery who did not fistula branch ligation.  She has completed 6-week course of daptomycin on 10/09/2021.  Discharge is pending ability for patient to tolerate hemodialysis in a chair.  Assessment & Plan:   Subacute osteomyelitis of left femur with abscess -Status post surgical intervention by Dr. [name].  She has completed 6-week course of daptomycin on 10/09/2021.  Outpatient follow-up with Dr. [name] 1 week after discharge -Continue decreased dose of oral narcotics as needed.  Continue muscle relaxant as needed. -Reimaging on 12/02/2021 of left knee showed healing fracture with callus -Seen by Dr. [name] again 10/28: looks to be doing well. No further left hip restriction.   Right thigh mild cellulitis -Controlled.  Completed 5-day course of antibiotics on 12/18/2021.  Symptoms most likely from anasarca.  History of left intertrochanteric fracture -Left hip hardware removed on 08/10/2021 prior to admission   End-stage renal disease on hemodialysis Anasarca -Nephrology following.  Dialysis as per nephrology schedule.  Still on Lasix.  Strict  input and output with daily weights.  Fluid restriction -Has to do hemodialysis in chair prior to discharge to outpatient hemodialysis  Hyponatremia -Management by hemodialysis.  Monitor intermittently.  Hyperphosphatemia--continue sevelamer.  Nephrology following.  Monitor intermittently.  Anemia of chronic disease -Hemoglobin currently stable.  Monitor intermittently  Diabetes mellitus type 2 with hyperglycemia -Continue long-acting insulin, NovoLog with meals along with CBGs with SSI  Hypertension -Monitor blood pressure.  Continue Coreg, hydralazine, Lasix.   History of unspecified CVA Hyperlipidemia -Continue Lipitor  GERD -Continue Protonix and Pepcid  Tobacco use -Tobacco cessation was discussed by prior hospitalist during this hospitalization  Anxiety and depression -Continue amitriptyline and Cymbalta  Goals of care -Prognosis is guarded to poor.  Remains full code.  Palliative care has already evaluated the patient during this hospitalization   DVT prophylaxis: SCDs Code Status: Full Family Communication: None at bedside Disposition Plan: Status is: Inpatient Remains inpatient appropriate because: Difficult placement due to inability to sit in chair for hemodialysis  Consultants: Orthopedics/nephrology/vascular surgery/ID  Procedures: As above  Antimicrobials:  Anti-infectives (From admission, onward)    Start     Dose/Rate Route Frequency Ordered Stop   12/15/21 1730  cefTRIAXone (ROCEPHIN) 2 g in sodium chloride 0.9 % 100 mL IVPB        2 g 200 mL/hr over 30 Minutes Intravenous Every 24 hours 12/15/21 1633 12/18/21 1750   12/14/21 1800  doxycycline (VIBRA-TABS) tablet 100 mg  Status:  Discontinued        100 mg Oral Every 12 hours 12/14/21 1607 12/15/21 1633   10/02/21 2000  DAPTOmycin (CUBICIN) 900 mg in sodium chloride 0.9 % IVPB  Status:  Discontinued        10 mg/kg  92.4 kg 136 mL/hr  over 30 Minutes Intravenous Once per day on Mon Wed Fri  10/01/21 1350 10/12/21 1146   09/30/21 2000  DAPTOmycin (CUBICIN) 900 mg in sodium chloride 0.9 % IVPB  Status:  Discontinued        10 mg/kg  92.4 kg 136 mL/hr over 30 Minutes Intravenous Daily 09/28/21 0919 10/01/21 1350   09/28/21 2000  DAPTOmycin (CUBICIN) 900 mg in sodium chloride 0.9 % IVPB        900 mg 136 mL/hr over 30 Minutes Intravenous  Once 09/28/21 0919 09/28/21 2146   09/02/21 2000  DAPTOmycin (CUBICIN) 900 mg in sodium chloride 0.9 % IVPB  Status:  Discontinued        10 mg/kg  92.4 kg 136 mL/hr over 30 Minutes Intravenous Once per day on Mon Wed Fri 09/02/21 0836 09/28/21 0919   08/31/21 0745  ceFAZolin (ANCEF) IVPB 2g/100 mL premix        2 g 200 mL/hr over 30 Minutes Intravenous To Short Stay 08/31/21 0730 08/31/21 0918   08/30/21 0600  ceFAZolin (ANCEF) IVPB 2g/100 mL premix  Status:  Discontinued        2 g 200 mL/hr over 30 Minutes Intravenous To Short Stay 08/30/21 0123 08/31/21 0600   08/29/21 2000  DAPTOmycin (CUBICIN) 900 mg in sodium chloride 0.9 % IVPB  Status:  Discontinued        10 mg/kg  92.4 kg 136 mL/hr over 30 Minutes Intravenous Every 48 hours 08/29/21 1627 09/02/21 0836   08/28/21 1800  ceFAZolin (ANCEF) IVPB 2g/100 mL premix  Status:  Discontinued        2 g 200 mL/hr over 30 Minutes Intravenous Every M-W-F (1800) 08/27/21 0846 08/29/21 1620   08/28/21 1200  ceFAZolin (ANCEF) IVPB 1 g/50 mL premix  Status:  Discontinued        1 g 100 mL/hr over 30 Minutes Intravenous Every M-W-F (Hemodialysis) 08/26/21 1954 08/27/21 0846   08/28/21 0600  ceFAZolin (ANCEF) IVPB 2g/100 mL premix        2 g 200 mL/hr over 30 Minutes Intravenous On call to O.R. 08/27/21 1150 08/28/21 0909   08/27/21 1800  ceFAZolin (ANCEF) IVPB 1 g/50 mL premix        1 g 100 mL/hr over 30 Minutes Intravenous  Once 08/26/21 1954 08/27/21 2048   08/26/21 1500  ceFEPIme (MAXIPIME) 1 g in sodium chloride 0.9 % 100 mL IVPB  Status:  Discontinued        1 g 200 mL/hr over 30 Minutes  Intravenous Every 24 hours 08/26/21 1452 08/26/21 1714        Subjective: Patient seen and examined at bedside undergoing hemodialysis.  Poor historian.  She is upset/angry about her pain medications being taken away.  No fever, worsening shortness of breath or vomiting reported. Objective: Vitals:   01/01/22 0832 01/01/22 0900 01/01/22 0930 01/01/22 1000  BP: (!) 146/50 (!) 157/53 (!) 148/64 (!) 147/64  Pulse: (!) 59 62 62 68  Resp: 17 18 13 10   Temp:      TempSrc:      SpO2: 100% 99% 99% 100%  Weight:      Height:        Intake/Output Summary (Last 24 hours) at 01/01/2022 1029 Last data filed at 12/31/2021 1712 Gross per 24 hour  Intake 1130 ml  Output 350 ml  Net 780 ml    Filed Weights   12/30/21 0720 12/30/21 1154 01/01/22 0830  Weight: 95.4 kg 92.6 kg 95.4  kg    Examination:  General exam: On room air.  No distress.  Chronically ill and deconditioned.  Still slow to respond.  Poor historian. Respiratory system: Decreased breath sounds at bases bilaterally with scattered crackles  cardiovascular system: Currently rate controlled; S1-S2 heard gastrointestinal system: Abdomen is distended mildly; soft and nontender.  Normal bowel sounds heard extremities: Has left BKA with dressing  Data Reviewed: I have personally reviewed following labs and imaging studies  CBC: Recent Labs  Lab 12/26/21 0801 12/27/21 0750 12/29/21 1041 01/01/22 0813  WBC 7.4 6.7 6.4 9.8  HGB 9.8* 9.4* 9.6* 9.6*  HCT 30.9* 27.7* 29.4* 29.9*  MCV 87.3 83.9 85.7 87.4  PLT 225 229 210 034    Basic Metabolic Panel: Recent Labs  Lab 12/26/21 0458 12/26/21 0801 12/28/21 1058 12/29/21 1041 12/30/21 0721 01/01/22 0813  NA  --  126* 128*  --  124* 127*  K  --  4.4 4.7  --  5.1 4.8  CL  --  87* 87*  --  83* 85*  CO2  --  26 26  --  24 25  GLUCOSE  --  163* 198*  --  140* 150*  BUN  --  67* 66*  --  101* 69*  CREATININE  --  6.62* 5.43*  --  7.18* 6.23*  CALCIUM  --  9.5 9.4  --  9.0  9.7  MG 1.9 1.9  --  1.8  --  1.8  PHOS  --   --  5.6*  --  6.5*  --     GFR: Estimated Creatinine Clearance: 12.9 mL/min (A) (by C-G formula based on SCr of 6.23 mg/dL (H)). Liver Function Tests: Recent Labs  Lab 12/28/21 1058 12/30/21 0721  ALBUMIN 3.7 3.4*    No results for input(s): "LIPASE", "AMYLASE" in the last 168 hours. No results for input(s): "AMMONIA" in the last 168 hours. Coagulation Profile: No results for input(s): "INR", "PROTIME" in the last 168 hours. Cardiac Enzymes: No results for input(s): "CKTOTAL", "CKMB", "CKMBINDEX", "TROPONINI" in the last 168 hours. BNP (last 3 results) No results for input(s): "PROBNP" in the last 8760 hours. HbA1C: No results for input(s): "HGBA1C" in the last 72 hours. CBG: Recent Labs  Lab 12/31/21 0724 12/31/21 1130 12/31/21 1645 12/31/21 2037 01/01/22 0744  GLUCAP 105* 160* 107* 171* 134*    Lipid Profile: No results for input(s): "CHOL", "HDL", "LDLCALC", "TRIG", "CHOLHDL", "LDLDIRECT" in the last 72 hours. Thyroid Function Tests: No results for input(s): "TSH", "T4TOTAL", "FREET4", "T3FREE", "THYROIDAB" in the last 72 hours. Anemia Panel: No results for input(s): "VITAMINB12", "FOLATE", "FERRITIN", "TIBC", "IRON", "RETICCTPCT" in the last 72 hours. Sepsis Labs: No results for input(s): "PROCALCITON", "LATICACIDVEN" in the last 168 hours.  No results found for this or any previous visit (from the past 240 hour(s)).       Radiology Studies: No results found.      Scheduled Meds:  (feeding supplement) PROSource Plus  30 mL Oral BID BM   alosetron  1 mg Oral BID   amitriptyline  25 mg Oral QHS   atorvastatin  40 mg Oral q1800   carvedilol  3.125 mg Oral BID   Chlorhexidine Gluconate Cloth  6 each Topical Q0600   cyclobenzaprine  7.5 mg Oral TID   [START ON 01/03/2022] darbepoetin (ARANESP) injection - DIALYSIS  100 mcg Subcutaneous Q Fri-1800   docusate sodium  200 mg Oral BID   DULoxetine  60 mg Oral  Daily  famotidine  20 mg Oral Daily   fentaNYL  1 patch Transdermal Q72H   furosemide  80 mg Oral Daily   insulin aspart  0-9 Units Subcutaneous TID WC   insulin aspart  4 Units Subcutaneous TID WC   insulin glargine-yfgn  9 Units Subcutaneous QHS   multivitamin  1 tablet Oral QHS   pantoprazole  80 mg Oral Daily   polyethylene glycol  17 g Oral Daily   sevelamer carbonate  2,400 mg Oral TID WC   sodium zirconium cyclosilicate  10 g Oral Once per day on Sun Tue Thu Sat   Continuous Infusions:  albumin human            Aline August, MD [facility] 01/01/2022, 10:29 AM

## 2022-01-02 DIAGNOSIS — M86252 Subacute osteomyelitis, left femur: Secondary | ICD-10-CM | POA: Diagnosis not present

## 2022-01-02 LAB — GLUCOSE, CAPILLARY
Glucose-Capillary: 136 mg/dL — ABNORMAL HIGH (ref 70–99)
Glucose-Capillary: 152 mg/dL — ABNORMAL HIGH (ref 70–99)
Glucose-Capillary: 183 mg/dL — ABNORMAL HIGH (ref 70–99)
Glucose-Capillary: 163 mg/dL — ABNORMAL HIGH (ref 70–99)

## 2022-01-02 MED ORDER — OXYCODONE HCL 5 MG PO TABS
5.0000 mg | ORAL_TABLET | Freq: Four times a day (QID) | ORAL | Status: DC | PRN
  Administered 2022-01-02 – 2022-01-08 (×14): 5 mg via ORAL
  Filled 2022-01-02 (×14): qty 1

## 2022-01-02 NOTE — Progress Notes (Signed)
Mobility Specialist Progress Note:   01/02/22 1610  Mobility  Activity Transferred from chair to bed  Level of Assistance Minimal assist, patient does 75% or more  Assistive Device Sliding board  Activity Response Tolerated well  $Mobility charge 1 Mobility   Pt agreeable to transfer to bed. Only required minA to place sliding board. Pt back in bed with all needs met.   Nelta Numbers Acute Rehab Secure Chat or Office Phone: [phone]

## 2022-01-02 NOTE — Progress Notes (Addendum)
[facility] Progress Note   Subjective: Seen in bed per usual. No C/Os. HD tomorrow.   Objective Vitals:   01/01/22 1431 01/01/22 2104 01/02/22 0453 01/02/22 0820  BP: (!) 125/53 (!) 108/45 (!) 129/52 (!) 165/53  Pulse: 64 62 (!) 55 (!) 59  Resp: 18 18 18 18   Temp: 98.5 F (36.9 C) 98.4 F (36.9 C) 97.7 F (36.5 C) 98 F (36.7 C)  TempSrc: Oral Oral Oral Oral  SpO2: 97% 97% 100% 100%  Weight:      Height:       Physical Exam General: Chronically ill appearing female in NAD Heart: S1,S2 No M/R/G  Lungs: CTA Abdomen: + edema sides of abdomin, NABS Extremities: L BKA L thigh edema, 1+ RLE  Dialysis Access: L AVF +T/B    Additional Objective Labs: Basic Metabolic Panel: Recent Labs  Lab 12/28/21 1058 12/30/21 0721 01/01/22 0813 01/01/22 0916  NA 128* 124* 127*  --   K 4.7 5.1 4.8  --   CL 87* 83* 85*  --   CO2 26 24 25   --   GLUCOSE 198* 140* 150*  --   BUN 66* 101* 69*  --   CREATININE 5.43* 7.18* 6.23*  --   CALCIUM 9.4 9.0 9.7  --   PHOS 5.6* 6.5*  --  5.3*   Liver Function Tests: Recent Labs  Lab 12/28/21 1058 12/30/21 0721  ALBUMIN 3.7 3.4*   No results for input(s): "LIPASE", "AMYLASE" in the last 168 hours. CBC: Recent Labs  Lab 12/27/21 0750 12/29/21 1041 01/01/22 0813  WBC 6.7 6.4 9.8  HGB 9.4* 9.6* 9.6*  HCT 27.7* 29.4* 29.9*  MCV 83.9 85.7 87.4  PLT 229 210 201   Blood Culture    Component Value Date/Time   SDES TISSUE 08/28/2021 0917   SPECREQUEST LEFT THIGH TISSUE 08/28/2021 0917   CULT  08/28/2021 0917    MODERATE ENTEROCOCCUS FAECIUM VANCOMYCIN RESISTANT ENTEROCOCCUS ISOLATED NO ANAEROBES ISOLATED Sent to [place] for further susceptibility testing. Performed at [facility], [place] [address], [place], [place] [address]    REPTSTATUS 09/02/2021 FINAL 08/28/2021 2694    Cardiac Enzymes: No results for input(s): "CKTOTAL", "CKMB", "CKMBINDEX", "TROPONINI" in the last 168 hours. CBG: Recent Labs  Lab  01/01/22 1432 01/01/22 1722 01/01/22 2105 01/02/22 0730 01/02/22 1106  GLUCAP 116* 198* 192* 136* 152*   Iron Studies: No results for input(s): "IRON", "TIBC", "TRANSFERRIN", "FERRITIN" in the last 72 hours. @lablastinr3 @ Studies/Results: No results found. Medications:  albumin human      (feeding supplement) PROSource Plus  30 mL Oral BID BM   alosetron  1 mg Oral BID   amitriptyline  25 mg Oral QHS   atorvastatin  40 mg Oral q1800   carvedilol  3.125 mg Oral BID   Chlorhexidine Gluconate Cloth  6 each Topical Q0600   cyclobenzaprine  7.5 mg Oral TID   [START ON 01/03/2022] darbepoetin (ARANESP) injection - DIALYSIS  100 mcg Subcutaneous Q Fri-1800   docusate sodium  200 mg Oral BID   DULoxetine  60 mg Oral Daily   famotidine  20 mg Oral Daily   fentaNYL  1 patch Transdermal Q72H   furosemide  80 mg Oral Daily   insulin aspart  0-9 Units Subcutaneous TID WC   insulin aspart  4 Units Subcutaneous TID WC   insulin glargine-yfgn  9 Units Subcutaneous QHS   multivitamin  1 tablet Oral QHS   pantoprazole  80 mg Oral Daily  polyethylene glycol  17 g Oral Daily   sevelamer carbonate  2,400 mg Oral TID WC   sodium zirconium cyclosilicate  10 g Oral Once per day on Sun Tue Thu Sat     Dialysis Orders: MWF at [facility], 400/500, EDW 90kg, 2K/2Ca, LUE AVF - heparin 4000 unit IV bolus - No ESA   Assessment/Plan:   Recurrent L hip osteomyelitis: S/p 08/10/21 surgery (removal hip IMN which was placed 12/2019) with persistent VRE infection. Back to OR 08/28/21, 08/31/21 for L hip debridement /wound vac  (now removed).  Completed 6 wk course of IV Daptomycin on 10/12/21. Management per primary team. Per Dr. Jess Barters note 12/28/2021 "NO RESTRICTIONS L HIP".  Chronic L hip/residual limb pain: She does have fractures of L femoral neck (where hardware was removed) as well as distal femur Fx. Pain control per primary.  ESRD: MWF HD on schedule.   Clotting during treatment, add 2000  unit mid run dose heparin + 4000 unit heparin bolus. Next HD 01/03/2022. Time decreased to 3.5 hours D/T scheduling.   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 Virl Cagey. AVF working well now. HTN/volume: BP stable on Coreg 6.25 mg BID (hold a.m. of HD) - d/c hydralazine 10/24.  Lower Coreg to 3.125mg  BID on 10/25.  Continue to attempt taper DOWN BP meds with UF on HD to prevent cramping.  Ongoing LE and flank edema noted. Hx large gains. Stressed fluid and sodium restrictions. Low sodium noted. Clearly still volume overloaded. Continue UF as tolerated.  Anemia of ESRD: Hgb 9s stable. Tsat 54%.  Aranesp increased to 100 q wk starting 01/03/22. Check iron panel with labs 01/03/2022. Secondary HPTH: CorrCa slightly high, not on VDRA. Phos variable, usually 5-6. Continue Renvela as binder. Nutrition: Alb low, continue protein supplements.  Renal diet w/fluid restrictions.  DM2: Insulin per primary Dispo: SNF previously recommended, now she would like to go home on discharge even though HD unit has been clear that they cannot assist patient out of her car and into the facility. Patient is aware of this.  Not home HD candidate.  Dialyzed in recliner on 9/4 but signed off early due to pain, has declined to come in a recliner since then.Strongly discussed on the importance of increasing her mobility as much as possible while she is in her room in addition to following her ongoing fluid restriction. Also reinforced the importance of being in the chair for HD treatments to improve strength and mobility. Our goal is to dialyze her in the mornings so the patient has more time to work with PT in the afternoons (we will do our best to accommodate this as we still need to always consider the emergent cases).  Encouraged ongoing progress. Per Renal Navigator notes there are no clinics that will accept patient unless she will have HD in chair. Will continue to  encourage use of chair for HD    Ritter Helsley H. [place] Severs NP-C 01/02/2022, 12:36 PM  [employer] [phone]

## 2022-01-02 NOTE — Progress Notes (Signed)
Physical Therapy Treatment Patient Details Name: [name] MRN: [id number] DOB: [birth year] Today's Date: 01/02/2022   History of Present Illness 56 y.o. female presented to ED 6/26 from dialysis with increased bloody drainage from L hip wound. 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 s/p hardware removal of left hip with partial resection of tuft tissue and femure that were nonviable on 08/10/21 Discharged home 6/23. underwent L hip debridement and wound vac placement 6/28; +Left intertrochanteric non-union,  Fracture of left distal femur  PMH: hypertension, hyperlipidemia, ESRD on HD MWF, history of left BKA in 2021, depression/anxiety, stroke, tobacco use, T2DM,  insomnia, chronic pain syndrome,    PT Comments    Patient just received lunch tray on arrival but agreeable to get OOB to chair to eat. Patient continues to require assist to place sliding board for lateral scoot transfer. Declined PROM this session but instructed patient on self ROM of knee extension and hip IR 2-3x/day for 5 reps x 30 seconds each. Continue to recommend SNF for ongoing Physical Therapy.       Recommendations for follow up therapy are one component of a multi-disciplinary discharge planning process, led by the attending physician.  Recommendations may be updated based on patient status, additional functional criteria and insurance authorization.  Follow Up Recommendations  Skilled nursing-short term rehab (<3 hours/day) (pt currently refusing due to inability to tolerate sitting in chair for 6+ hours as transported to/from dialysis) Can patient physically be transported by private vehicle: No   Assistance Recommended at Discharge Intermittent Supervision/Assistance  Patient can return home with the following A lot of help with bathing/dressing/bathroom;Assistance with cooking/housework;Assist for transportation;Two people to help with walking and/or transfers;Help with stairs or  ramp for entrance   Equipment Recommendations  None recommended by PT    Recommendations for Other Services       Precautions / Restrictions Precautions Precautions: Fall;Other (comment) Precaution Comments: L BKA (baseline) Restrictions Weight Bearing Restrictions: No     Mobility  Bed Mobility Overal bed mobility: Needs Assistance Bed Mobility: Supine to Sit     Supine to sit: Modified independent (Device/Increase time), Min assist Sit to supine: Modified independent (Device/Increase time)        Transfers Overall transfer level: Needs assistance Equipment used: Sliding board Transfers: Bed to chair/wheelchair/BSC            Lateral/Scoot Transfers: Min assist General transfer comment: continues to require assist to place sliding board. Requires multiple small scoots to complete transfer    Ambulation/Gait                   Stairs             Wheelchair Mobility    Modified Rankin (Stroke Patients Only)       Balance Overall balance assessment: Needs assistance Sitting-balance support: Feet supported Sitting balance-Leahy Scale: Fair                                      Cognition Arousal/Alertness: Awake/alert Behavior During Therapy: WFL for tasks assessed/performed Overall Cognitive Status: Within Functional Limits for tasks assessed                                          Exercises Other Exercises Other Exercises: Declined  PROM; instructed on self ROM of knee extension and hip IR and to perform 2-3 sets/day for 5 reps x 30 seconds each    General Comments        Pertinent Vitals/Pain Pain Assessment Pain Assessment: Faces Faces Pain Scale: No hurt Pain Intervention(s): Monitored during session    Home Living                          Prior Function            PT Goals (current goals can now be found in the care plan section) Acute Rehab PT Goals PT Goal Formulation:  With patient Time For Goal Achievement: 01/09/22 Potential to Achieve Goals: Fair Progress towards PT goals: Progressing toward goals    Frequency    Min 2X/week      PT Plan Current plan remains appropriate    Co-evaluation              AM-PAC PT "6 Clicks" Mobility   Outcome Measure  Help needed turning from your back to your side while in a flat bed without using bedrails?: None Help needed moving from lying on your back to sitting on the side of a flat bed without using bedrails?: None Help needed moving to and from a bed to a chair (including a wheelchair)?: A Little Help needed standing up from a chair using your arms (e.g., wheelchair or bedside chair)?: Total Help needed to walk in hospital room?: Total Help needed climbing 3-5 steps with a railing? : Total 6 Click Score: 14    End of Session Equipment Utilized During Treatment: Other (comment) (sliding board) Activity Tolerance: Patient tolerated treatment well Patient left: in chair;with call bell/phone within reach;Other (comment) Nurse Communication: Mobility status PT Visit Diagnosis: Other abnormalities of gait and mobility (R26.89);Muscle weakness (generalized) (M62.81);History of falling (Z91.81);Pain Pain - Right/Left: Left Pain - part of body: Leg     Time: 1346-1410 PT Time Calculation (min) (ACUTE ONLY): 24 min  Charges:  $Therapeutic Exercise: 8-22 mins $Therapeutic Activity: 8-22 mins                     Saif Peter A. Gilford Rile PT, DPT Acute Rehabilitation Services Office [phone]    [name] 01/02/2022, 4:33 PM

## 2022-01-02 NOTE — Progress Notes (Signed)
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  Orthopedic/ID were consulted.  She was started on broad-spectrum antibiotics.  Underwent multiple I&D's by Dr. [name] along with local tissue rearrangement.  She was also seen by vascular surgery who did not fistula branch ligation.  She has completed 6-week course of daptomycin on 10/09/2021.  Discharge is pending ability for patient to tolerate hemodialysis in a chair.  Assessment & Plan:   Subacute osteomyelitis of left femur with abscess -Status post surgical intervention by Dr. [name].  She has completed 6-week course of daptomycin on 10/09/2021.  Outpatient follow-up with Dr. [name] 1 week after discharge -Continue decreased dose of oral narcotics as needed.  Continue muscle relaxant as needed. -Reimaging on 12/02/2021 of left knee showed healing fracture with callus -Seen by Dr. [name] again 10/28: looks to be doing well. No further left hip restriction.   Right thigh mild cellulitis -Controlled.  Completed 5-day course of antibiotics on 12/18/2021.  Symptoms most likely from anasarca.  History of left intertrochanteric fracture -Left hip hardware removed on 08/10/2021 prior to admission   End-stage renal disease on hemodialysis Anasarca -Nephrology following.  Dialysis as per nephrology schedule.  Still on Lasix.  Strict  input and output with daily weights.  Fluid restriction -Has to do hemodialysis in chair prior to discharge to outpatient hemodialysis  Hyponatremia -Management by hemodialysis.  Monitor intermittently.  Hyperphosphatemia--continue sevelamer.  Nephrology following.  Monitor intermittently.  Anemia of chronic disease -Hemoglobin currently stable.  Monitor intermittently  Diabetes mellitus type 2 with hyperglycemia -Continue long-acting insulin, NovoLog with meals along with CBGs with SSI  Hypertension -Monitor blood pressure.  Continue Coreg, hydralazine, Lasix.   History of unspecified CVA Hyperlipidemia -Continue Lipitor  GERD -Continue Protonix and Pepcid  Tobacco use -Tobacco cessation was discussed by prior hospitalist during this hospitalization  Anxiety and depression -Continue amitriptyline and Cymbalta  Goals of care -Prognosis is guarded to poor.  Remains full code.  Palliative care has already evaluated the patient during this hospitalization   DVT prophylaxis: SCDs Code Status: Full Family Communication: None at bedside Disposition Plan: Status is: Inpatient Remains inpatient appropriate because: Difficult placement due to inability to sit in chair for hemodialysis  Consultants: Orthopedics/nephrology/vascular surgery/ID  Procedures: As above  Antimicrobials:  Anti-infectives (From admission, onward)    Start     Dose/Rate Route Frequency Ordered Stop   12/15/21 1730  cefTRIAXone (ROCEPHIN) 2 g in sodium chloride 0.9 % 100 mL IVPB        2 g 200 mL/hr over 30 Minutes Intravenous Every 24 hours 12/15/21 1633 12/18/21 1750   12/14/21 1800  doxycycline (VIBRA-TABS) tablet 100 mg  Status:  Discontinued        100 mg Oral Every 12 hours 12/14/21 1607 12/15/21 1633   10/02/21 2000  DAPTOmycin (CUBICIN) 900 mg in sodium chloride 0.9 % IVPB  Status:  Discontinued        10 mg/kg  92.4 kg 136 mL/hr  over 30 Minutes Intravenous Once per day on Mon Wed Fri  10/01/21 1350 10/12/21 1146   09/30/21 2000  DAPTOmycin (CUBICIN) 900 mg in sodium chloride 0.9 % IVPB  Status:  Discontinued        10 mg/kg  92.4 kg 136 mL/hr over 30 Minutes Intravenous Daily 09/28/21 0919 10/01/21 1350   09/28/21 2000  DAPTOmycin (CUBICIN) 900 mg in sodium chloride 0.9 % IVPB        900 mg 136 mL/hr over 30 Minutes Intravenous  Once 09/28/21 0919 09/28/21 2146   09/02/21 2000  DAPTOmycin (CUBICIN) 900 mg in sodium chloride 0.9 % IVPB  Status:  Discontinued        10 mg/kg  92.4 kg 136 mL/hr over 30 Minutes Intravenous Once per day on Mon Wed Fri 09/02/21 0836 09/28/21 0919   08/31/21 0745  ceFAZolin (ANCEF) IVPB 2g/100 mL premix        2 g 200 mL/hr over 30 Minutes Intravenous To Short Stay 08/31/21 0730 08/31/21 0918   08/30/21 0600  ceFAZolin (ANCEF) IVPB 2g/100 mL premix  Status:  Discontinued        2 g 200 mL/hr over 30 Minutes Intravenous To Short Stay 08/30/21 0123 08/31/21 0600   08/29/21 2000  DAPTOmycin (CUBICIN) 900 mg in sodium chloride 0.9 % IVPB  Status:  Discontinued        10 mg/kg  92.4 kg 136 mL/hr over 30 Minutes Intravenous Every 48 hours 08/29/21 1627 09/02/21 0836   08/28/21 1800  ceFAZolin (ANCEF) IVPB 2g/100 mL premix  Status:  Discontinued        2 g 200 mL/hr over 30 Minutes Intravenous Every M-W-F (1800) 08/27/21 0846 08/29/21 1620   08/28/21 1200  ceFAZolin (ANCEF) IVPB 1 g/50 mL premix  Status:  Discontinued        1 g 100 mL/hr over 30 Minutes Intravenous Every M-W-F (Hemodialysis) 08/26/21 1954 08/27/21 0846   08/28/21 0600  ceFAZolin (ANCEF) IVPB 2g/100 mL premix        2 g 200 mL/hr over 30 Minutes Intravenous On call to O.R. 08/27/21 1150 08/28/21 0909   08/27/21 1800  ceFAZolin (ANCEF) IVPB 1 g/50 mL premix        1 g 100 mL/hr over 30 Minutes Intravenous  Once 08/26/21 1954 08/27/21 2048   08/26/21 1500  ceFEPIme (MAXIPIME) 1 g in sodium chloride 0.9 % 100 mL IVPB  Status:  Discontinued        1 g 200 mL/hr over 30 Minutes  Intravenous Every 24 hours 08/26/21 1452 08/26/21 1714        Subjective: Patient seen and examined at bedside. Poor historian.  No chest pain, worsening shortness of breath or abdomen pain reported.   Objective: Vitals:   01/01/22 1336 01/01/22 1431 01/01/22 2104 01/02/22 0453  BP: 119/64 (!) 125/53 (!) 108/45 (!) 129/52  Pulse: 64 64 62 (!) 55  Resp: 19 18 18 18   Temp: 97.9 F (36.6 C) 98.5 F (36.9 C) 98.4 F (36.9 C) 97.7 F (36.5 C)  TempSrc: Oral Oral Oral Oral  SpO2: 100% 97% 97% 100%  Weight: 92.3 kg     Height:        Intake/Output Summary (Last 24 hours) at 01/02/2022 0753 Last data filed at 01/02/2022 0200 Gross per 24 hour  Intake 220 ml  Output 3.9 ml  Net 216.1 ml    Filed Weights   12/30/21 1154 01/01/22 0830 01/01/22 1336  Weight: 92.6 kg 95.4  kg 92.3 kg    Examination:  General exam: Currently on room air.  No acute distress.  Chronically ill and deconditioned.  Still slow to respond.  Poor historian. Respiratory system: Bilateral decreased breath sounds at bases with some crackles  cardiovascular system: S1-S2 heard; rate controlled mostly  gastrointestinal system: Abdomen is still has some distention; soft and nontender.  Bowel sounds heard normally  extremities: Left BKA with dressing present  Data Reviewed: I have personally reviewed following labs and imaging studies  CBC: Recent Labs  Lab 12/26/21 0801 12/27/21 0750 12/29/21 1041 01/01/22 0813  WBC 7.4 6.7 6.4 9.8  HGB 9.8* 9.4* 9.6* 9.6*  HCT 30.9* 27.7* 29.4* 29.9*  MCV 87.3 83.9 85.7 87.4  PLT 225 229 210 779    Basic Metabolic Panel: Recent Labs  Lab 12/26/21 0801 12/28/21 1058 12/29/21 1041 12/30/21 0721 01/01/22 0813 01/01/22 0916  NA 126* 128*  --  124* 127*  --   K 4.4 4.7  --  5.1 4.8  --   CL 87* 87*  --  83* 85*  --   CO2 26 26  --  24 25  --   GLUCOSE 163* 198*  --  140* 150*  --   BUN 67* 66*  --  101* 69*  --   CREATININE 6.62* 5.43*  --  7.18* 6.23*  --    CALCIUM 9.5 9.4  --  9.0 9.7  --   MG 1.9  --  1.8  --  1.8  --   PHOS  --  5.6*  --  6.5*  --  5.3*    GFR: Estimated Creatinine Clearance: 12.7 mL/min (A) (by C-G formula based on SCr of 6.23 mg/dL (H)). Liver Function Tests: Recent Labs  Lab 12/28/21 1058 12/30/21 0721  ALBUMIN 3.7 3.4*    No results for input(s): "LIPASE", "AMYLASE" in the last 168 hours. No results for input(s): "AMMONIA" in the last 168 hours. Coagulation Profile: No results for input(s): "INR", "PROTIME" in the last 168 hours. Cardiac Enzymes: No results for input(s): "CKTOTAL", "CKMB", "CKMBINDEX", "TROPONINI" in the last 168 hours. BNP (last 3 results) No results for input(s): "PROBNP" in the last 8760 hours. HbA1C: No results for input(s): "HGBA1C" in the last 72 hours. CBG: Recent Labs  Lab 01/01/22 0744 01/01/22 1432 01/01/22 1722 01/01/22 2105 01/02/22 0730  GLUCAP 134* 116* 198* 192* 136*    Lipid Profile: No results for input(s): "CHOL", "HDL", "LDLCALC", "TRIG", "CHOLHDL", "LDLDIRECT" in the last 72 hours. Thyroid Function Tests: No results for input(s): "TSH", "T4TOTAL", "FREET4", "T3FREE", "THYROIDAB" in the last 72 hours. Anemia Panel: No results for input(s): "VITAMINB12", "FOLATE", "FERRITIN", "TIBC", "IRON", "RETICCTPCT" in the last 72 hours. Sepsis Labs: No results for input(s): "PROCALCITON", "LATICACIDVEN" in the last 168 hours.  No results found for this or any previous visit (from the past 240 hour(s)).       Radiology Studies: No results found.      Scheduled Meds:  (feeding supplement) PROSource Plus  30 mL Oral BID BM   alosetron  1 mg Oral BID   amitriptyline  25 mg Oral QHS   atorvastatin  40 mg Oral q1800   carvedilol  3.125 mg Oral BID   Chlorhexidine Gluconate Cloth  6 each Topical Q0600   cyclobenzaprine  7.5 mg Oral TID   [START ON 01/03/2022] darbepoetin (ARANESP) injection - DIALYSIS  100 mcg Subcutaneous Q Fri-1800   docusate sodium  200 mg  Oral BID  DULoxetine  60 mg Oral Daily   famotidine  20 mg Oral Daily   fentaNYL  1 patch Transdermal Q72H   furosemide  80 mg Oral Daily   insulin aspart  0-9 Units Subcutaneous TID WC   insulin aspart  4 Units Subcutaneous TID WC   insulin glargine-yfgn  9 Units Subcutaneous QHS   multivitamin  1 tablet Oral QHS   pantoprazole  80 mg Oral Daily   polyethylene glycol  17 g Oral Daily   sevelamer carbonate  2,400 mg Oral TID WC   sodium zirconium cyclosilicate  10 g Oral Once per day on Sun Tue Thu Sat   Continuous Infusions:  albumin human            Aline August, MD [facility] 01/02/2022, 7:53 AM

## 2022-01-03 DIAGNOSIS — M86252 Subacute osteomyelitis, left femur: Secondary | ICD-10-CM | POA: Diagnosis not present

## 2022-01-03 LAB — GLUCOSE, CAPILLARY
Glucose-Capillary: 150 mg/dL — ABNORMAL HIGH (ref 70–99)
Glucose-Capillary: 232 mg/dL — ABNORMAL HIGH (ref 70–99)
Glucose-Capillary: 178 mg/dL — ABNORMAL HIGH (ref 70–99)

## 2022-01-03 MED ORDER — HEPARIN SODIUM (PORCINE) 1000 UNIT/ML DIALYSIS
4000.0000 [IU] | Freq: Once | INTRAMUSCULAR | Status: AC
  Administered 2022-01-03: 4000 [IU] via INTRAVENOUS_CENTRAL
  Filled 2022-01-03 (×2): qty 4

## 2022-01-03 MED ORDER — PENTAFLUOROPROP-TETRAFLUOROETH EX AERO
1.0000 | INHALATION_SPRAY | CUTANEOUS | Status: DC | PRN
  Administered 2022-01-03: 1 via TOPICAL
  Filled 2022-01-03: qty 116

## 2022-01-03 NOTE — TOC Progression Note (Signed)
Transition of Care [facility]) - Initial/Assessment Note    Patient Details  Name: [name] MRN: [id number] Date of Birth: [birth year]  Transition of Care [facility]) CM/SW Contact:    Milinda Antis, [place] Phone Number: 01/03/2022, 2:34 PM  Clinical Narrative:                 TOC continuing to follow patient.  We are unable to find safe disposition as she is not willing to sit in the chair for dialysis.    Expected Discharge Plan: Skilled Nursing Facility Barriers to Discharge: Continued Medical Work up   Patient Goals and CMS Choice Patient states their goals for this hospitalization and ongoing recovery are:: To return home CMS Medicare.gov Compare Post Acute Care list provided to:: Patient Choice offered to / list presented to : NA  Expected Discharge Plan and Services Expected Discharge Plan: [place]   Discharge Planning Services: CM Consult   Living arrangements for the past 2 months: Single Family Home                                      Prior Living Arrangements/Services Living arrangements for the past 2 months: Single Family Home Lives with:: Adult Children Patient language and need for interpreter reviewed:: Yes Do you feel safe going back to the place where you live?: Yes      Need for Family Participation in Patient Care: Yes (Comment) Care giver support system in place?: Yes (comment) Current home services: DME ([employer], w/c) Criminal Activity/Legal Involvement Pertinent to Current Situation/Hospitalization: No - Comment as needed  Activities of Daily Living Home Assistive Devices/Equipment: Eyeglasses, Wheelchair ADL Screening (condition at time of admission) Patient's cognitive ability adequate to safely complete daily activities?: Yes Is the patient deaf or have difficulty hearing?: No Does the patient have difficulty seeing, even when wearing glasses/contacts?: No Does the patient have difficulty concentrating, remembering, or  making decisions?: No Patient able to express need for assistance with ADLs?: Yes Does the patient have difficulty dressing or bathing?: Yes Independently performs ADLs?: No Communication: Independent Dressing (OT): Independent Is this a change from baseline?: Pre-admission baseline Grooming: Independent Feeding: Independent Bathing: Needs assistance Is this a change from baseline?: Pre-admission baseline Toileting: Needs assistance Is this a change from baseline?: Pre-admission baseline In/Out Bed: Needs assistance, Dependent Is this a change from baseline?: Pre-admission baseline Walks in Home: Dependent Is this a change from baseline?: Pre-admission baseline Does the patient have difficulty walking or climbing stairs?: Yes Weakness of Legs: Both Weakness of Arms/Hands: Both  Permission Sought/Granted Permission sought to share information with : Case Manager, Family Supports Permission granted to share information with : Yes, Verbal Permission Granted              Emotional Assessment Appearance:: Appears stated age Attitude/Demeanor/Rapport: Engaged, Gracious Affect (typically observed): Accepting, Appropriate, Calm, Hopeful Orientation: : Oriented to Self, Oriented to Place, Oriented to  Time, Oriented to Situation Alcohol / Substance Use: Not Applicable Psych Involvement: No (comment)  Admission diagnosis:  Wound infection [T14.8XXA, L08.9] Abscess of left hip [L02.416] Patient Active Problem List   Diagnosis Date Noted   Wound dehiscence, surgical, sequela    Abscess of left hip 08/26/2021   History of CVA (cerebrovascular accident) 33/29/5188   Hardware complicating wound infection ([place])    Subacute osteomyelitis of left femur with abscess     Septic arthritis of hip (  [place]) 07/30/2021   Skin abscess L hip 07/28/2021   HCAP (healthcare-associated pneumonia) 07/27/2021   Hyperkalemia 50/27/7412   Metabolic acidemia 87/86/7672   Closed fracture of left distal  femur ([place]) 07/22/2021   Type 2 diabetes mellitus with hyperlipidemia ([place]) 07/22/2021   Infection of superficial incisional surgical site after procedure 03/09/2020   Abscess    Hypoglycemia    Essential hypertension    Sleep disturbance    ESRD (end stage renal disease) on dialysis ([place]) 01/24/2020   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[place]) 01/23/2020   Hyponatremia    Constipation    Chronic osteomyelitis involving left ankle and foot ([place])    Ulcer of left foot with necrosis of bone ([place])    Wound infection 12/28/2019   History of [place] amputation of left foot ([place]) 12/25/2017   History of CVA (cerebrovascular accident) without residual deficits 07/04/2017   Cerebral thrombosis with cerebral infarction 04/08/2017   Right sided weakness 04/07/2017   Hyperhidrosis 09/01/2016   Migraine with aura and without status migrainosus, not intractable 07/28/2016   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   CKD stage 3 due to type 2 diabetes mellitus ([place]) 06/27/2014   Vitamin D insufficiency 05/08/2014   Obesity (BMI 30.0-34.9) 05/08/2014   Unilateral amputation of left foot ([place]) 05/08/2014   Bursitis of left shoulder 02/14/2014   Neck pain 01/03/2014   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.23) 01/14/2013   Insomnia 08/04/2012   Diabetic neuropathy, painful ([place]) 08/04/2011   Anxiety and depression 05/16/2010   Female stress incontinence 11/01/2007   TOBACCO DEPENDENCE 04/30/2006   GASTROESOPHAGEAL REFLUX, NO ESOPHAGITIS 04/30/2006   Irritable bowel syndrome 04/30/2006   PCP:  [name], MD Pharmacy:   [place], [place] [address] [place] [address] Phone: [phone] Fax: [phone]  [id number] - [place], [place] [address] [place] [address] Phone: [phone] Fax:  [phone]     Social Determinants of Health (SDOH) Interventions    Readmission Risk Interventions    07/26/2021    3:15 PM  Readmission Risk Prevention Plan  Transportation Screening Complete  PCP or Specialist Appt within 3-5 Days Complete  HRI or [place] Complete  Palliative Care Screening Not Applicable  Medication Review (RN Care Manager) Referral to Pharmacy

## 2022-01-03 NOTE — Progress Notes (Signed)
PROGRESS NOTE    [name]  QQV:[id number] DOB: 1965/12/25 DOA: 08/26/2021 PCP: [name], MD   Brief Narrative:  56 year old female with history of hypertension, hyperlipidemia, diabetes mellitus type 2, end-stage renal disease on hemodialysis, left BKA for chronic calcaneus osteomyelitis on 01/12/2020, bipolar disorder, left distal femoral fracture after a fall on 07/19/2021 deemed a nonoperative, admission from 08/03/2021-08/23/2021 for left femoral osteomyelitis requiring partial resection of femur and rearrangements of bony fragments with subsequent antibiotic treatment presented with left hip bleeding and was found to have subacute osteomyelitis and abscess of left femur.  Orthopedic/ID were consulted.  She was started on broad-spectrum antibiotics.  Underwent multiple I&D's by Dr. [name] along with local tissue rearrangement.  She was also seen by vascular surgery who did not fistula branch ligation.  She has completed 6-week course of daptomycin on 10/09/2021.    Assessment & Plan:   Subacute osteomyelitis of left femur with abscess -Status post surgical intervention by Dr. [name].  She has completed 6-week course of daptomycin on 10/09/2021.  Outpatient follow-up with Dr. [name] 1 week after discharge -Continue decreased dose of oral narcotics as needed.  Continue muscle relaxant as needed. -Reimaging on 12/02/2021 of left knee showed healing fracture with callus -Seen by Dr. [name] again 10/28: looks to be doing well. No further left hip restriction.   Right thigh mild cellulitis -Controlled.  Completed 5-day course of antibiotics on 12/18/2021.  Symptoms most likely from anasarca.  History of left intertrochanteric fracture -Left hip hardware removed on 08/10/2021 prior to admission   End-stage renal disease on hemodialysis Anasarca -Nephrology following.  Dialysis as per nephrology schedule.  Still on Lasix.  Strict input and output with daily weights.  Fluid restriction -Has to do  hemodialysis in chair prior to discharge to outpatient hemodialysis  Hyponatremia -Management by hemodialysis.  Monitor intermittently.  Hyperphosphatemia--continue sevelamer.  Nephrology following.  Monitor intermittently.  Anemia of chronic disease -Hemoglobin currently stable.  Monitor intermittently  Diabetes mellitus type 2 with hyperglycemia -Continue long-acting insulin, NovoLog with meals along with CBGs with SSI  Hypertension -Monitor blood pressure.  Continue Coreg, hydralazine, Lasix.    History of unspecified CVA Hyperlipidemia -Continue Lipitor  GERD -Continue Protonix and Pepcid  Tobacco use -Tobacco cessation was discussed by prior hospitalist during this hospitalization  Anxiety and depression -Continue amitriptyline and Cymbalta  Goals of care -Prognosis is guarded to poor.  Remains full code.  Palliative care has already evaluated the patient during this hospitalization   DVT prophylaxis: SCDs Code Status: Full Family Communication: None at bedside Disposition Plan: Status is: Inpatient Remains inpatient appropriate because: Difficult placement due to inability to sit in chair for hemodialysis  Consultants: Orthopedics/nephrology/vascular surgery/ID  Procedures: As above  Antimicrobials:  Anti-infectives (From admission, onward)    Start     Dose/Rate Route Frequency Ordered Stop   12/15/21 1730  cefTRIAXone (ROCEPHIN) 2 g in sodium chloride 0.9 % 100 mL IVPB        2 g 200 mL/hr over 30 Minutes Intravenous Every 24 hours 12/15/21 1633 12/18/21 1750   12/14/21 1800  doxycycline (VIBRA-TABS) tablet 100 mg  Status:  Discontinued        100 mg Oral Every 12 hours 12/14/21 1607 12/15/21 1633   10/02/21 2000  DAPTOmycin (CUBICIN) 900 mg in sodium chloride 0.9 % IVPB  Status:  Discontinued        10 mg/kg  92.4 kg 136 mL/hr over 30 Minutes Intravenous Once per day on Mon Wed  Fri 10/01/21 1350 10/12/21 1146   09/30/21 2000  DAPTOmycin (CUBICIN) 900  mg in sodium chloride 0.9 % IVPB  Status:  Discontinued        10 mg/kg  92.4 kg 136 mL/hr over 30 Minutes Intravenous Daily 09/28/21 0919 10/01/21 1350   09/28/21 2000  DAPTOmycin (CUBICIN) 900 mg in sodium chloride 0.9 % IVPB        900 mg 136 mL/hr over 30 Minutes Intravenous  Once 09/28/21 0919 09/28/21 2146   09/02/21 2000  DAPTOmycin (CUBICIN) 900 mg in sodium chloride 0.9 % IVPB  Status:  Discontinued        10 mg/kg  92.4 kg 136 mL/hr over 30 Minutes Intravenous Once per day on Mon Wed Fri 09/02/21 0836 09/28/21 0919   08/31/21 0745  ceFAZolin (ANCEF) IVPB 2g/100 mL premix        2 g 200 mL/hr over 30 Minutes Intravenous To Short Stay 08/31/21 0730 08/31/21 0918   08/30/21 0600  ceFAZolin (ANCEF) IVPB 2g/100 mL premix  Status:  Discontinued        2 g 200 mL/hr over 30 Minutes Intravenous To Short Stay 08/30/21 0123 08/31/21 0600   08/29/21 2000  DAPTOmycin (CUBICIN) 900 mg in sodium chloride 0.9 % IVPB  Status:  Discontinued        10 mg/kg  92.4 kg 136 mL/hr over 30 Minutes Intravenous Every 48 hours 08/29/21 1627 09/02/21 0836   08/28/21 1800  ceFAZolin (ANCEF) IVPB 2g/100 mL premix  Status:  Discontinued        2 g 200 mL/hr over 30 Minutes Intravenous Every M-W-F (1800) 08/27/21 0846 08/29/21 1620   08/28/21 1200  ceFAZolin (ANCEF) IVPB 1 g/50 mL premix  Status:  Discontinued        1 g 100 mL/hr over 30 Minutes Intravenous Every M-W-F (Hemodialysis) 08/26/21 1954 08/27/21 0846   08/28/21 0600  ceFAZolin (ANCEF) IVPB 2g/100 mL premix        2 g 200 mL/hr over 30 Minutes Intravenous On call to O.R. 08/27/21 1150 08/28/21 0909   08/27/21 1800  ceFAZolin (ANCEF) IVPB 1 g/50 mL premix        1 g 100 mL/hr over 30 Minutes Intravenous  Once 08/26/21 1954 08/27/21 2048   08/26/21 1500  ceFEPIme (MAXIPIME) 1 g in sodium chloride 0.9 % 100 mL IVPB  Status:  Discontinued        1 g 200 mL/hr over 30 Minutes Intravenous Every 24 hours 08/26/21 1452 08/26/21 1714         Subjective: Patient seen and examined at bedside undergoing hemodialysis. Poor historian.  No fever, worsening shortness of breath, chest pain or vomiting reported.   Objective: Vitals:   01/02/22 0453 01/02/22 0820 01/02/22 1722 01/02/22 2053  BP: (!) 129/52 (!) 165/53 (!) 130/53 124/60  Pulse: (!) 55 (!) 59 60 63  Resp: 18 18 18 16   Temp: 97.7 F (36.5 C) 98 F (36.7 C) 98 F (36.7 C) 98 F (36.7 C)  TempSrc: Oral Oral Oral Oral  SpO2: 100% 100% 99% 98%  Weight:    94.4 kg  Height:        Intake/Output Summary (Last 24 hours) at 01/03/2022 0733 Last data filed at 01/03/2022 0600 Gross per 24 hour  Intake 1080 ml  Output 300 ml  Net 780 ml    Filed Weights   01/01/22 0830 01/01/22 1336 01/02/22 2053  Weight: 95.4 kg 92.3 kg 94.4 kg    Examination:  General exam: No distress.  On room air.  Chronically ill and deconditioned.  Still slow to respond.  Poor historian. Respiratory system: Decreased breath sounds at bases bilaterally with scattered crackles  cardiovascular system: Mild intermittent bradycardia present; S1-S2 heard gastrointestinal system: Abdomen is distended slightly; soft and nontender.  Normal bowel sounds heard extremities: Left BKA with dressing present  Data Reviewed: I have personally reviewed following labs and imaging studies  CBC: Recent Labs  Lab 12/27/21 0750 12/29/21 1041 01/01/22 0813  WBC 6.7 6.4 9.8  HGB 9.4* 9.6* 9.6*  HCT 27.7* 29.4* 29.9*  MCV 83.9 85.7 87.4  PLT 229 210 546    Basic Metabolic Panel: Recent Labs  Lab 12/28/21 1058 12/29/21 1041 12/30/21 0721 01/01/22 0813 01/01/22 0916  NA 128*  --  124* 127*  --   K 4.7  --  5.1 4.8  --   CL 87*  --  83* 85*  --   CO2 26  --  24 25  --   GLUCOSE 198*  --  140* 150*  --   BUN 66*  --  101* 69*  --   CREATININE 5.43*  --  7.18* 6.23*  --   CALCIUM 9.4  --  9.0 9.7  --   MG  --  1.8  --  1.8  --   PHOS 5.6*  --  6.5*  --  5.3*    GFR: Estimated Creatinine  Clearance: 12.9 mL/min (A) (by C-G formula based on SCr of 6.23 mg/dL (H)). Liver Function Tests: Recent Labs  Lab 12/28/21 1058 12/30/21 0721  ALBUMIN 3.7 3.4*    No results for input(s): "LIPASE", "AMYLASE" in the last 168 hours. No results for input(s): "AMMONIA" in the last 168 hours. Coagulation Profile: No results for input(s): "INR", "PROTIME" in the last 168 hours. Cardiac Enzymes: No results for input(s): "CKTOTAL", "CKMB", "CKMBINDEX", "TROPONINI" in the last 168 hours. BNP (last 3 results) No results for input(s): "PROBNP" in the last 8760 hours. HbA1C: No results for input(s): "HGBA1C" in the last 72 hours. CBG: Recent Labs  Lab 01/01/22 2105 01/02/22 0730 01/02/22 1106 01/02/22 1641 01/02/22 2051  GLUCAP 192* 136* 152* 183* 163*    Lipid Profile: No results for input(s): "CHOL", "HDL", "LDLCALC", "TRIG", "CHOLHDL", "LDLDIRECT" in the last 72 hours. Thyroid Function Tests: No results for input(s): "TSH", "T4TOTAL", "FREET4", "T3FREE", "THYROIDAB" in the last 72 hours. Anemia Panel: No results for input(s): "VITAMINB12", "FOLATE", "FERRITIN", "TIBC", "IRON", "RETICCTPCT" in the last 72 hours. Sepsis Labs: No results for input(s): "PROCALCITON", "LATICACIDVEN" in the last 168 hours.  No results found for this or any previous visit (from the past 240 hour(s)).       Radiology Studies: No results found.      Scheduled Meds:  (feeding supplement) PROSource Plus  30 mL Oral BID BM   alosetron  1 mg Oral BID   amitriptyline  25 mg Oral QHS   atorvastatin  40 mg Oral q1800   carvedilol  3.125 mg Oral BID   Chlorhexidine Gluconate Cloth  6 each Topical Q0600   cyclobenzaprine  7.5 mg Oral TID   darbepoetin (ARANESP) injection - DIALYSIS  100 mcg Subcutaneous Q Fri-1800   docusate sodium  200 mg Oral BID   DULoxetine  60 mg Oral Daily   famotidine  20 mg Oral Daily   fentaNYL  1 patch Transdermal Q72H   furosemide  80 mg Oral Daily   heparin  4,000  Units Dialysis  Once in dialysis   insulin aspart  0-9 Units Subcutaneous TID WC   insulin aspart  4 Units Subcutaneous TID WC   insulin glargine-yfgn  9 Units Subcutaneous QHS   multivitamin  1 tablet Oral QHS   pantoprazole  80 mg Oral Daily   polyethylene glycol  17 g Oral Daily   sevelamer carbonate  2,400 mg Oral TID WC   sodium zirconium cyclosilicate  10 g Oral Once per day on Sun Tue Thu Sat   Continuous Infusions:  albumin human            Aline August, MD [facility] 01/03/2022, 7:33 AM

## 2022-01-03 NOTE — Progress Notes (Signed)
[facility] Progress Note   Subjective:  Seen on HD - no new symptoms. 4L UFG and tolerating.   **This patient will not be seen over this weekend (11/4-11/5/23) unless emergency. If any renal/dialysis needs, please page Pollyann Kennedy [phone].  Objective Vitals:   01/02/22 2053 01/03/22 0824 01/03/22 0854 01/03/22 0910  BP: 124/60 (!) 156/55 (!) 123/40 (!) 140/48  Pulse: 63 (!) 59 61 (!) 57  Resp: 16 12 11 10   Temp: 98 F (36.7 C) 98.1 F (36.7 C)    TempSrc: Oral Oral    SpO2: 98% 99% 98% 100%  Weight: 94.4 kg     Height:       Physical Exam General: Chronically ill appearing woman, NAD Heart: RRR; no murmur Lungs: CTAB; no rales Abdomen: soft Extremities: L BKA - no distal stump edema, but 2+ dense thigh/flank edema; 1+ RLE edema Dialysis Access: L AVF + thrill/bruit  Additional Objective Labs: Basic Metabolic Panel: Recent Labs  Lab 12/28/21 1058 12/30/21 0721 01/01/22 0813 01/01/22 0916  NA 128* 124* 127*  --   K 4.7 5.1 4.8  --   CL 87* 83* 85*  --   CO2 26 24 25   --   GLUCOSE 198* 140* 150*  --   BUN 66* 101* 69*  --   CREATININE 5.43* 7.18* 6.23*  --   CALCIUM 9.4 9.0 9.7  --   PHOS 5.6* 6.5*  --  5.3*   Liver Function Tests: Recent Labs  Lab 12/28/21 1058 12/30/21 0721  ALBUMIN 3.7 3.4*   CBC: Recent Labs  Lab 12/29/21 1041 01/01/22 0813  WBC 6.4 9.8  HGB 9.6* 9.6*  HCT 29.4* 29.9*  MCV 85.7 87.4  PLT 210 201   Blood Culture    Component Value Date/Time   SDES TISSUE 08/28/2021 0917   SPECREQUEST LEFT THIGH TISSUE 08/28/2021 0917   CULT  08/28/2021 0917    MODERATE ENTEROCOCCUS FAECIUM VANCOMYCIN RESISTANT ENTEROCOCCUS ISOLATED NO ANAEROBES ISOLATED Sent to [facility] for further susceptibility testing. Performed at [facility], [place] [address], [place], [place] [address]    REPTSTATUS 09/02/2021 FINAL 08/28/2021 0962   Medications:  albumin human      (feeding supplement) PROSource Plus  30 mL  Oral BID BM   alosetron  1 mg Oral BID   amitriptyline  25 mg Oral QHS   atorvastatin  40 mg Oral q1800   carvedilol  3.125 mg Oral BID   Chlorhexidine Gluconate Cloth  6 each Topical Q0600   cyclobenzaprine  7.5 mg Oral TID   darbepoetin (ARANESP) injection - DIALYSIS  100 mcg Subcutaneous Q Fri-1800   docusate sodium  200 mg Oral BID   DULoxetine  60 mg Oral Daily   famotidine  20 mg Oral Daily   fentaNYL  1 patch Transdermal Q72H   furosemide  80 mg Oral Daily   insulin aspart  0-9 Units Subcutaneous TID WC   insulin aspart  4 Units Subcutaneous TID WC   insulin glargine-yfgn  9 Units Subcutaneous QHS   multivitamin  1 tablet Oral QHS   pantoprazole  80 mg Oral Daily   polyethylene glycol  17 g Oral Daily   sevelamer carbonate  2,400 mg Oral TID WC   sodium zirconium cyclosilicate  10 g Oral Once per day on Sun Tue Thu Sat    Dialysis Orders: MWF at [facility], 400/500, EDW 90kg, 2K/2Ca, LUE AVF - heparin 4000 unit IV bolus, no ESA  Assessment/Plan: Recurrent L hip osteomyelitis: S/p 08/10/21 surgery (removal hip IMN which was placed 12/2019) with persistent VRE infection. Back to OR 08/28/21, 08/31/21 for L hip debridement /wound vac (now removed). Completed 6 wk course of IV Daptomycin on 10/12/21. Per Dr. Jess Barters note 12/28/2021 "NO RESTRICTIONS L HIP".  Chronic L hip/residual limb pain: She does have fractures of L femoral neck (where hardware was removed) as well as distal femur Fx. Pain control per primary.  ESRD: Continue HD on usual MWF schedule. Clots HD system without heparin - using 4000 unit bolus and 2000 unit mid-run bolus. HD today.  Hyperkalemia: Stable on scheduled lokelma 4 days a week. HTN/volume: BP meds have been tapered down - currently on Coreg 3.125mg  BID only. Ongoing large fluids gains between HD which has been discussed extensively.  Anemia of ESRD: Hgb 9s stable. Last tsat 54%. Continue Aranesp 139mcg weekly. Secondary HPTH: CorrCa slightly high,  not on VDRA. Phos variable, usually 5-6. Continue Renvela as binder. Nutrition: Alb low, continue protein supplements.  Renal diet w/fluid restrictions.  AVF dysfunction (resolved): S/p branch ligation 09/19/2021  Dr. Virl Cagey. AVF working well now. DM2: Insulin per primary Dispo: SNF previously recommended, but she declined. Not home HD candidate.  Dialyzed in recliner on 9/4 but signed off early due to pain, has declined to come in a recliner since then.Strongly discussed on the importance of increasing her mobility as much as possible while she is in her room in addition to following her ongoing fluid restriction. Also reinforced the importance of being in the chair for HD treatments to improve strength and mobility. Our goal is to dialyze her in the mornings so the patient has more time to work with PT in the afternoons (we will do our best to accommodate this as we still need to always consider the emergent cases).  Encouraged ongoing progress. Per Renal Navigator notes there are no clinics that will accept patient unless she will have HD in chair. Will continue to encourage use of chair for HD    Pollyann Kennedy 01/03/2022, 9:25 AM  [employer]

## 2022-01-03 NOTE — Procedures (Signed)
HD Note:  Some information was entered later than the data was gathered due to patient care needs. The stated time with the data is accurate.  Received patient in bed to unit.  Alert and oriented.  Informed consent signed and in chart.   Patient BP dropped below defined limits and the UF had to be turned off toward the end of the treatment.  Interventions were not enough to restart the UF before the end of treatment.   Transported back to the room  Alert, without acute distress.  Hand-off given to patient's nurse.   Access used: Left AVF upper arm Access issues: None  Total UF removed: [facility]

## 2022-01-03 NOTE — Progress Notes (Signed)
Pt off unit in hemodialysis

## 2022-01-04 DIAGNOSIS — T8131XS Disruption of external operation (surgical) wound, not elsewhere classified, sequela: Secondary | ICD-10-CM | POA: Diagnosis not present

## 2022-01-04 DIAGNOSIS — T148XXA Other injury of unspecified body region, initial encounter: Secondary | ICD-10-CM | POA: Diagnosis not present

## 2022-01-04 DIAGNOSIS — N186 End stage renal disease: Secondary | ICD-10-CM | POA: Diagnosis not present

## 2022-01-04 DIAGNOSIS — M86252 Subacute osteomyelitis, left femur: Secondary | ICD-10-CM | POA: Diagnosis not present

## 2022-01-04 LAB — GLUCOSE, CAPILLARY
Glucose-Capillary: 247 mg/dL — ABNORMAL HIGH (ref 70–99)
Glucose-Capillary: 256 mg/dL — ABNORMAL HIGH (ref 70–99)
Glucose-Capillary: 94 mg/dL (ref 70–99)
Glucose-Capillary: 179 mg/dL — ABNORMAL HIGH (ref 70–99)

## 2022-01-04 NOTE — Progress Notes (Signed)
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  Orthopedic/ID were consulted.  She was started on broad-spectrum antibiotics.  Underwent multiple I&D's by Dr. [name] along with local tissue rearrangement.  She was also seen by vascular surgery who did not fistula branch ligation.  She has completed 6-week course of daptomycin on 10/09/2021.    Assessment & Plan:   Subacute osteomyelitis of left femur with abscess -Status post surgical intervention by Dr. [name].  She has completed 6-week course of daptomycin on 10/09/2021.  Outpatient follow-up with Dr. [name] 1 week after discharge -Continue decreased dose of oral narcotics as needed.  Continue muscle relaxant as needed. -Reimaging on 12/02/2021 of left knee showed healing fracture with callus -Seen by Dr. [name] again 10/28: looks to be doing well. No further left hip restriction.   Right thigh mild cellulitis -Controlled.  Completed 5-day course of antibiotics on 12/18/2021.  Symptoms most likely from anasarca.  History of left intertrochanteric fracture -Left hip hardware removed on 08/10/2021 prior to admission   End-stage renal disease on hemodialysis Anasarca -Nephrology following.  Dialysis as per nephrology schedule.  Still on Lasix.  Strict input and output with daily weights.  Fluid restriction -Has to do  hemodialysis in chair prior to discharge to outpatient hemodialysis  Hyponatremia -Management by hemodialysis.  Monitor intermittently.  Hyperphosphatemia--continue sevelamer.  Nephrology following.  Monitor intermittently.  Anemia of chronic disease -Hemoglobin currently stable.  Monitor intermittently  Diabetes mellitus type 2 with hyperglycemia -Continue long-acting insulin, NovoLog with meals along with CBGs with SSI  Hypertension -Monitor blood pressure.  Continue Coreg, hydralazine, Lasix.    History of unspecified CVA Hyperlipidemia -Continue Lipitor  GERD -Continue Protonix and Pepcid  Tobacco use -Tobacco cessation was discussed by prior hospitalist during this hospitalization  Anxiety and depression -Continue amitriptyline and Cymbalta  Goals of care -Prognosis is guarded to poor.  Remains full code.  Palliative care has already evaluated the patient during this hospitalization   DVT prophylaxis: SCDs Code Status: Full Family Communication: None at bedside Disposition Plan: Status is: Inpatient Remains inpatient appropriate because: Difficult placement due to inability to sit in chair for hemodialysis  Consultants: Orthopedics/nephrology/vascular surgery/ID  Procedures: As above  Antimicrobials:  Anti-infectives (From admission, onward)    Start     Dose/Rate Route Frequency Ordered Stop   12/15/21 1730  cefTRIAXone (ROCEPHIN) 2 g in sodium chloride 0.9 % 100 mL IVPB        2 g 200 mL/hr over 30 Minutes Intravenous Every 24 hours 12/15/21 1633 12/18/21 1750   12/14/21 1800  doxycycline (VIBRA-TABS) tablet 100 mg  Status:  Discontinued        100 mg Oral Every 12 hours 12/14/21 1607 12/15/21 1633   10/02/21 2000  DAPTOmycin (CUBICIN) 900 mg in sodium chloride 0.9 % IVPB  Status:  Discontinued        10 mg/kg  92.4 kg 136 mL/hr over 30 Minutes Intravenous Once per day on Mon Wed  Fri 10/01/21 1350 10/12/21 1146   09/30/21 2000  DAPTOmycin (CUBICIN) 900  mg in sodium chloride 0.9 % IVPB  Status:  Discontinued        10 mg/kg  92.4 kg 136 mL/hr over 30 Minutes Intravenous Daily 09/28/21 0919 10/01/21 1350   09/28/21 2000  DAPTOmycin (CUBICIN) 900 mg in sodium chloride 0.9 % IVPB        900 mg 136 mL/hr over 30 Minutes Intravenous  Once 09/28/21 0919 09/28/21 2146   09/02/21 2000  DAPTOmycin (CUBICIN) 900 mg in sodium chloride 0.9 % IVPB  Status:  Discontinued        10 mg/kg  92.4 kg 136 mL/hr over 30 Minutes Intravenous Once per day on Mon Wed Fri 09/02/21 0836 09/28/21 0919   08/31/21 0745  ceFAZolin (ANCEF) IVPB 2g/100 mL premix        2 g 200 mL/hr over 30 Minutes Intravenous To Short Stay 08/31/21 0730 08/31/21 0918   08/30/21 0600  ceFAZolin (ANCEF) IVPB 2g/100 mL premix  Status:  Discontinued        2 g 200 mL/hr over 30 Minutes Intravenous To Short Stay 08/30/21 0123 08/31/21 0600   08/29/21 2000  DAPTOmycin (CUBICIN) 900 mg in sodium chloride 0.9 % IVPB  Status:  Discontinued        10 mg/kg  92.4 kg 136 mL/hr over 30 Minutes Intravenous Every 48 hours 08/29/21 1627 09/02/21 0836   08/28/21 1800  ceFAZolin (ANCEF) IVPB 2g/100 mL premix  Status:  Discontinued        2 g 200 mL/hr over 30 Minutes Intravenous Every M-W-F (1800) 08/27/21 0846 08/29/21 1620   08/28/21 1200  ceFAZolin (ANCEF) IVPB 1 g/50 mL premix  Status:  Discontinued        1 g 100 mL/hr over 30 Minutes Intravenous Every M-W-F (Hemodialysis) 08/26/21 1954 08/27/21 0846   08/28/21 0600  ceFAZolin (ANCEF) IVPB 2g/100 mL premix        2 g 200 mL/hr over 30 Minutes Intravenous On call to O.R. 08/27/21 1150 08/28/21 0909   08/27/21 1800  ceFAZolin (ANCEF) IVPB 1 g/50 mL premix        1 g 100 mL/hr over 30 Minutes Intravenous  Once 08/26/21 1954 08/27/21 2048   08/26/21 1500  ceFEPIme (MAXIPIME) 1 g in sodium chloride 0.9 % 100 mL IVPB  Status:  Discontinued        1 g 200 mL/hr over 30 Minutes Intravenous Every 24 hours 08/26/21 1452 08/26/21 1714         Subjective: Patient seen and examined at bedside. Poor historian.  No chest pain, worsening shortness of breath, fever reported.   Objective: Vitals:   01/03/22 1659 01/03/22 2105 01/04/22 0500 01/04/22 0545  BP: (!) 124/56 (!) 116/101  (!) 128/53  Pulse: 70 71  (!) 57  Resp: 19 17  17   Temp: 97.6 F (36.4 C) 98.5 F (36.9 C)  98.5 F (36.9 C)  TempSrc:      SpO2: 97% 98%  100%  Weight:   94.8 kg   Height:        Intake/Output Summary (Last 24 hours) at 01/04/2022 0732 Last data filed at 01/04/2022 0602 Gross per 24 hour  Intake 1400 ml  Output 3700 ml  Net -2300 ml    Filed Weights   01/01/22 1336 01/02/22 2053 01/04/22 0500  Weight: 92.3 kg 94.4 kg 94.8 kg    Examination:  General exam: Currently on room air.  No acute distress.  Chronically ill and deconditioned.  Still slow to respond.  Poor historian. Respiratory system: Bilateral decreased breath sounds at bases with some crackles  cardiovascular system: S1-S2 heard; currently rate controlled  gastrointestinal system: Abdomen still has some distention; soft and nontender.  Bowel sounds are heard normally extremities: Left BKA with dressing present  Data Reviewed: I have personally reviewed following labs and imaging studies  CBC: Recent Labs  Lab 12/29/21 1041 01/01/22 0813  WBC 6.4 9.8  HGB 9.6* 9.6*  HCT 29.4* 29.9*  MCV 85.7 87.4  PLT 210 035    Basic Metabolic Panel: Recent Labs  Lab 12/28/21 1058 12/29/21 1041 12/30/21 0721 01/01/22 0813 01/01/22 0916  NA 128*  --  124* 127*  --   K 4.7  --  5.1 4.8  --   CL 87*  --  83* 85*  --   CO2 26  --  24 25  --   GLUCOSE 198*  --  140* 150*  --   BUN 66*  --  101* 69*  --   CREATININE 5.43*  --  7.18* 6.23*  --   CALCIUM 9.4  --  9.0 9.7  --   MG  --  1.8  --  1.8  --   PHOS 5.6*  --  6.5*  --  5.3*    GFR: Estimated Creatinine Clearance: 12.9 mL/min (A) (by C-G formula based on SCr of 6.23 mg/dL (H)). Liver Function Tests: Recent  Labs  Lab 12/28/21 1058 12/30/21 0721  ALBUMIN 3.7 3.4*    No results for input(s): "LIPASE", "AMYLASE" in the last 168 hours. No results for input(s): "AMMONIA" in the last 168 hours. Coagulation Profile: No results for input(s): "INR", "PROTIME" in the last 168 hours. Cardiac Enzymes: No results for input(s): "CKTOTAL", "CKMB", "CKMBINDEX", "TROPONINI" in the last 168 hours. BNP (last 3 results) No results for input(s): "PROBNP" in the last 8760 hours. HbA1C: No results for input(s): "HGBA1C" in the last 72 hours. CBG: Recent Labs  Lab 01/02/22 1641 01/02/22 2051 01/03/22 1329 01/03/22 1658 01/03/22 2104  GLUCAP 183* 163* 150* 232* 178*    Lipid Profile: No results for input(s): "CHOL", "HDL", "LDLCALC", "TRIG", "CHOLHDL", "LDLDIRECT" in the last 72 hours. Thyroid Function Tests: No results for input(s): "TSH", "T4TOTAL", "FREET4", "T3FREE", "THYROIDAB" in the last 72 hours. Anemia Panel: No results for input(s): "VITAMINB12", "FOLATE", "FERRITIN", "TIBC", "IRON", "RETICCTPCT" in the last 72 hours. Sepsis Labs: No results for input(s): "PROCALCITON", "LATICACIDVEN" in the last 168 hours.  No results found for this or any previous visit (from the past 240 hour(s)).       Radiology Studies: No results found.      Scheduled Meds:  (feeding supplement) PROSource Plus  30 mL Oral BID BM   alosetron  1 mg Oral BID   amitriptyline  25 mg Oral QHS   atorvastatin  40 mg Oral q1800   carvedilol  3.125 mg Oral BID   cyclobenzaprine  7.5 mg Oral TID   darbepoetin (ARANESP) injection - DIALYSIS  100 mcg Subcutaneous Q Fri-1800   docusate sodium  200 mg Oral BID   DULoxetine  60 mg Oral Daily   famotidine  20 mg Oral Daily   fentaNYL  1 patch Transdermal Q72H   furosemide  80 mg Oral Daily   insulin aspart  0-9 Units Subcutaneous TID WC   insulin aspart  4 Units Subcutaneous TID WC   insulin glargine-yfgn  9 Units Subcutaneous  QHS   multivitamin  1 tablet Oral  QHS   pantoprazole  80 mg Oral Daily   polyethylene glycol  17 g Oral Daily   sevelamer carbonate  2,400 mg Oral TID WC   sodium zirconium cyclosilicate  10 g Oral Once per day on Sun Tue Thu Sat   Continuous Infusions:  albumin human            Aline August, MD [facility] 01/04/2022, 7:32 AM

## 2022-01-05 DIAGNOSIS — E1169 Type 2 diabetes mellitus with other specified complication: Secondary | ICD-10-CM | POA: Diagnosis not present

## 2022-01-05 DIAGNOSIS — M86252 Subacute osteomyelitis, left femur: Secondary | ICD-10-CM | POA: Diagnosis not present

## 2022-01-05 DIAGNOSIS — T148XXA Other injury of unspecified body region, initial encounter: Secondary | ICD-10-CM | POA: Diagnosis not present

## 2022-01-05 DIAGNOSIS — N186 End stage renal disease: Secondary | ICD-10-CM | POA: Diagnosis not present

## 2022-01-05 LAB — GLUCOSE, CAPILLARY
Glucose-Capillary: 124 mg/dL — ABNORMAL HIGH (ref 70–99)
Glucose-Capillary: 234 mg/dL — ABNORMAL HIGH (ref 70–99)
Glucose-Capillary: 172 mg/dL — ABNORMAL HIGH (ref 70–99)
Glucose-Capillary: 164 mg/dL — ABNORMAL HIGH (ref 70–99)

## 2022-01-05 NOTE — Progress Notes (Signed)
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  Orthopedic/ID were consulted.  She was started on broad-spectrum antibiotics.  Underwent multiple I&D's by Dr. [name] along with local tissue rearrangement.  She was also seen by vascular surgery who did not fistula branch ligation.  She has completed 6-week course of daptomycin on 10/09/2021.    Assessment & Plan:   Subacute osteomyelitis of left femur with abscess -Status post surgical intervention by Dr. [name].  She has completed 6-week course of daptomycin on 10/09/2021.  Outpatient follow-up with Dr. [name] 1 week after discharge -Continue current pain management regimen along with muscle relaxants. -Reimaging on 12/02/2021 of left knee showed healing fracture with callus -Seen by Dr. [name] again 10/28: looks to be doing well. No further left hip restriction.   Right thigh mild cellulitis -Controlled.  Completed 5-day course of antibiotics on 12/18/2021.  Symptoms most likely from anasarca.  History of left intertrochanteric fracture -Left hip hardware removed on 08/10/2021 prior to admission   End-stage renal disease on hemodialysis Anasarca -Nephrology following.  Dialysis as per nephrology schedule.  Still on Lasix.  Strict input and output with daily weights.  Fluid restriction -Has to do hemodialysis in chair prior  to discharge to outpatient hemodialysis  Hyponatremia -Management by hemodialysis.  Monitor intermittently.  Hyperphosphatemia--continue sevelamer.  Nephrology following.  Monitor intermittently.  Anemia of chronic disease -Hemoglobin currently stable.  Monitor intermittently  Diabetes mellitus type 2 with hyperglycemia -Continue long-acting insulin, NovoLog with meals along with CBGs with SSI  Hypertension -Monitor blood pressure.  Continue Coreg, hydralazine, Lasix.    History of unspecified CVA Hyperlipidemia -Continue Lipitor  GERD -Continue Protonix and Pepcid  Tobacco use -Tobacco cessation was discussed by prior hospitalist during this hospitalization  Anxiety and depression -Continue amitriptyline and Cymbalta  Goals of care -Prognosis is guarded to poor.  Remains full code.  Palliative care has already evaluated the patient during this hospitalization   DVT prophylaxis: SCDs Code Status: Full Family Communication: None at bedside Disposition Plan: Status is: Inpatient Remains inpatient appropriate because: Difficult placement due to inability to sit in chair for hemodialysis  Consultants: Orthopedics/nephrology/vascular surgery/ID  Procedures: As above  Antimicrobials:  Anti-infectives (From admission, onward)    Start     Dose/Rate Route Frequency Ordered Stop   12/15/21 1730  cefTRIAXone (ROCEPHIN) 2 g in sodium chloride 0.9 % 100 mL IVPB        2 g 200 mL/hr over 30 Minutes Intravenous Every 24 hours 12/15/21 1633 12/18/21 1750   12/14/21 1800  doxycycline (VIBRA-TABS) tablet 100 mg  Status:  Discontinued        100 mg Oral Every 12 hours 12/14/21 1607 12/15/21 1633   10/02/21 2000  DAPTOmycin (CUBICIN) 900 mg in sodium chloride 0.9 % IVPB  Status:  Discontinued        10 mg/kg  92.4 kg 136 mL/hr over 30 Minutes Intravenous Once per day on Mon Wed Fri 10/01/21 1350 10/12/21 1146  09/30/21 2000  DAPTOmycin (CUBICIN) 900 mg in sodium chloride 0.9 %  IVPB  Status:  Discontinued        10 mg/kg  92.4 kg 136 mL/hr over 30 Minutes Intravenous Daily 09/28/21 0919 10/01/21 1350   09/28/21 2000  DAPTOmycin (CUBICIN) 900 mg in sodium chloride 0.9 % IVPB        900 mg 136 mL/hr over 30 Minutes Intravenous  Once 09/28/21 0919 09/28/21 2146   09/02/21 2000  DAPTOmycin (CUBICIN) 900 mg in sodium chloride 0.9 % IVPB  Status:  Discontinued        10 mg/kg  92.4 kg 136 mL/hr over 30 Minutes Intravenous Once per day on Mon Wed Fri 09/02/21 0836 09/28/21 0919   08/31/21 0745  ceFAZolin (ANCEF) IVPB 2g/100 mL premix        2 g 200 mL/hr over 30 Minutes Intravenous To Short Stay 08/31/21 0730 08/31/21 0918   08/30/21 0600  ceFAZolin (ANCEF) IVPB 2g/100 mL premix  Status:  Discontinued        2 g 200 mL/hr over 30 Minutes Intravenous To Short Stay 08/30/21 0123 08/31/21 0600   08/29/21 2000  DAPTOmycin (CUBICIN) 900 mg in sodium chloride 0.9 % IVPB  Status:  Discontinued        10 mg/kg  92.4 kg 136 mL/hr over 30 Minutes Intravenous Every 48 hours 08/29/21 1627 09/02/21 0836   08/28/21 1800  ceFAZolin (ANCEF) IVPB 2g/100 mL premix  Status:  Discontinued        2 g 200 mL/hr over 30 Minutes Intravenous Every M-W-F (1800) 08/27/21 0846 08/29/21 1620   08/28/21 1200  ceFAZolin (ANCEF) IVPB 1 g/50 mL premix  Status:  Discontinued        1 g 100 mL/hr over 30 Minutes Intravenous Every M-W-F (Hemodialysis) 08/26/21 1954 08/27/21 0846   08/28/21 0600  ceFAZolin (ANCEF) IVPB 2g/100 mL premix        2 g 200 mL/hr over 30 Minutes Intravenous On call to O.R. 08/27/21 1150 08/28/21 0909   08/27/21 1800  ceFAZolin (ANCEF) IVPB 1 g/50 mL premix        1 g 100 mL/hr over 30 Minutes Intravenous  Once 08/26/21 1954 08/27/21 2048   08/26/21 1500  ceFEPIme (MAXIPIME) 1 g in sodium chloride 0.9 % 100 mL IVPB  Status:  Discontinued        1 g 200 mL/hr over 30 Minutes Intravenous Every 24 hours 08/26/21 1452 08/26/21 1714        Subjective: Patient seen and  examined at bedside. Poor historian.  No worsening shortness of breath, fever, vomiting or chest pain reported.   Objective: Vitals:   01/04/22 0817 01/04/22 1637 01/04/22 2104 01/05/22 0512  BP: (!) 142/47 (!) 130/55 (!) 163/58 (!) 156/64  Pulse: 64 68 62 (!) 58  Resp: 18 17 18 18   Temp: 98.3 F (36.8 C) 98.7 F (37.1 C) 98.3 F (36.8 C) 98.1 F (36.7 C)  TempSrc: Oral Oral    SpO2: 100% 100% 100% 100%  Weight:    94.3 kg  Height:        Intake/Output Summary (Last 24 hours) at 01/05/2022 0744 Last data filed at 01/05/2022 3151 Gross per 24 hour  Intake 1080 ml  Output 351 ml  Net 729 ml    Filed Weights   01/02/22 2053 01/04/22 0500 01/05/22 0512  Weight: 94.4 kg 94.8 kg 94.3 kg    Examination:  General exam: No distress.  Still on room  air.  Chronically ill and deconditioned.  Still slow to respond.  Poor historian. Respiratory system: Decreased breath sounds at bases bilaterally with some scattered crackles  cardiovascular system: Rate controlled; S1 and S2 are heard gastrointestinal system: Abdomen is distended; soft and nontender.  Normal bowel sounds heard  extremities: Left BKA with dressing present  Data Reviewed: I have personally reviewed following labs and imaging studies  CBC: Recent Labs  Lab 12/29/21 1041 01/01/22 0813  WBC 6.4 9.8  HGB 9.6* 9.6*  HCT 29.4* 29.9*  MCV 85.7 87.4  PLT 210 563    Basic Metabolic Panel: Recent Labs  Lab 12/29/21 1041 12/30/21 0721 01/01/22 0813 01/01/22 0916  NA  --  124* 127*  --   K  --  5.1 4.8  --   CL  --  83* 85*  --   CO2  --  24 25  --   GLUCOSE  --  140* 150*  --   BUN  --  101* 69*  --   CREATININE  --  7.18* 6.23*  --   CALCIUM  --  9.0 9.7  --   MG 1.8  --  1.8  --   PHOS  --  6.5*  --  5.3*    GFR: Estimated Creatinine Clearance: 12.8 mL/min (A) (by C-G formula based on SCr of 6.23 mg/dL (H)). Liver Function Tests: Recent Labs  Lab 12/30/21 0721  ALBUMIN 3.4*    No results for  input(s): "LIPASE", "AMYLASE" in the last 168 hours. No results for input(s): "AMMONIA" in the last 168 hours. Coagulation Profile: No results for input(s): "INR", "PROTIME" in the last 168 hours. Cardiac Enzymes: No results for input(s): "CKTOTAL", "CKMB", "CKMBINDEX", "TROPONINI" in the last 168 hours. BNP (last 3 results) No results for input(s): "PROBNP" in the last 8760 hours. HbA1C: No results for input(s): "HGBA1C" in the last 72 hours. CBG: Recent Labs  Lab 01/04/22 0813 01/04/22 1110 01/04/22 1616 01/04/22 2103 01/05/22 0726  GLUCAP 247* 256* 94 179* 124*    Lipid Profile: No results for input(s): "CHOL", "HDL", "LDLCALC", "TRIG", "CHOLHDL", "LDLDIRECT" in the last 72 hours. Thyroid Function Tests: No results for input(s): "TSH", "T4TOTAL", "FREET4", "T3FREE", "THYROIDAB" in the last 72 hours. Anemia Panel: No results for input(s): "VITAMINB12", "FOLATE", "FERRITIN", "TIBC", "IRON", "RETICCTPCT" in the last 72 hours. Sepsis Labs: No results for input(s): "PROCALCITON", "LATICACIDVEN" in the last 168 hours.  No results found for this or any previous visit (from the past 240 hour(s)).       Radiology Studies: No results found.      Scheduled Meds:  (feeding supplement) PROSource Plus  30 mL Oral BID BM   alosetron  1 mg Oral BID   amitriptyline  25 mg Oral QHS   atorvastatin  40 mg Oral q1800   carvedilol  3.125 mg Oral BID   cyclobenzaprine  7.5 mg Oral TID   darbepoetin (ARANESP) injection - DIALYSIS  100 mcg Subcutaneous Q Fri-1800   docusate sodium  200 mg Oral BID   DULoxetine  60 mg Oral Daily   famotidine  20 mg Oral Daily   fentaNYL  1 patch Transdermal Q72H   furosemide  80 mg Oral Daily   insulin aspart  0-9 Units Subcutaneous TID WC   insulin aspart  4 Units Subcutaneous TID WC   insulin glargine-yfgn  9 Units Subcutaneous QHS   multivitamin  1 tablet Oral QHS   pantoprazole  80 mg Oral Daily  polyethylene glycol  17 g Oral Daily    sevelamer carbonate  2,400 mg Oral TID WC   sodium zirconium cyclosilicate  10 g Oral Once per day on Sun Tue Thu Sat   Continuous Infusions:  albumin human            Aline August, MD [facility] 01/05/2022, 7:44 AM

## 2022-01-06 DIAGNOSIS — N186 End stage renal disease: Secondary | ICD-10-CM | POA: Diagnosis not present

## 2022-01-06 DIAGNOSIS — M86252 Subacute osteomyelitis, left femur: Secondary | ICD-10-CM | POA: Diagnosis not present

## 2022-01-06 DIAGNOSIS — Z992 Dependence on renal dialysis: Secondary | ICD-10-CM | POA: Diagnosis not present

## 2022-01-06 LAB — GLUCOSE, CAPILLARY
Glucose-Capillary: 157 mg/dL — ABNORMAL HIGH (ref 70–99)
Glucose-Capillary: 178 mg/dL — ABNORMAL HIGH (ref 70–99)
Glucose-Capillary: 224 mg/dL — ABNORMAL HIGH (ref 70–99)

## 2022-01-06 MED ORDER — HEPARIN SODIUM (PORCINE) 1000 UNIT/ML IJ SOLN
INTRAMUSCULAR | Status: AC
  Filled 2022-01-06: qty 4

## 2022-01-06 NOTE — Progress Notes (Signed)
[facility] Progress Note   Subjective:   Seen on HD. Reports no new concerns over the weekend. Says she tolerates sitting for about 3 hours at a time. Edema improved. No SOB, CP, dizziness or nausea.   Objective Vitals:   01/05/22 2107 01/06/22 0527 01/06/22 0827 01/06/22 0840  BP: (!) 130/51 (!) 170/65 (!) 158/50 (!) 159/85  Pulse: (!) 57 (!) 58 (!) 57 (!) 55  Resp: 18 17 13 10   Temp: 98.2 F (36.8 C) 98.3 F (36.8 C) 98.1 F (36.7 C)   TempSrc:   Oral   SpO2: 99% 100% 98% 99%  Weight:  94.1 kg 97.5 kg   Height:       Physical Exam General: WDWN female, alert and in NAD Heart:RRR, no murmurs, rubs or gallops Lungs: CTA bilaterally without wheezing, rhonchi or rales Abdomen: Soft, non-distended, +BS Extremities: Trace edema L leg, no edema RLE Dialysis Access: LUE AVF Accessed  Additional Objective Labs: Basic Metabolic Panel: Recent Labs  Lab 01/01/22 0813 01/01/22 0916  NA 127*  --   K 4.8  --   CL 85*  --   CO2 25  --   GLUCOSE 150*  --   BUN 69*  --   CREATININE 6.23*  --   CALCIUM 9.7  --   PHOS  --  5.3*   Liver Function Tests: No results for input(s): "AST", "ALT", "ALKPHOS", "BILITOT", "PROT", "ALBUMIN" in the last 168 hours. No results for input(s): "LIPASE", "AMYLASE" in the last 168 hours. CBC: Recent Labs  Lab 01/01/22 0813  WBC 9.8  HGB 9.6*  HCT 29.9*  MCV 87.4  PLT 201   Blood Culture    Component Value Date/Time   SDES TISSUE 08/28/2021 0917   SPECREQUEST LEFT THIGH TISSUE 08/28/2021 0917   CULT  08/28/2021 0917    MODERATE ENTEROCOCCUS FAECIUM VANCOMYCIN RESISTANT ENTEROCOCCUS ISOLATED NO ANAEROBES ISOLATED Sent to [facility] for further susceptibility testing. Performed at [facility], [place] [address], [place], [place] [address]    REPTSTATUS 09/02/2021 FINAL 08/28/2021 0923    Cardiac Enzymes: No results for input(s): "CKTOTAL", "CKMB", "CKMBINDEX", "TROPONINI" in the last 168 hours. CBG: Recent Labs   Lab 01/05/22 0726 01/05/22 1118 01/05/22 1615 01/05/22 2106 01/06/22 0736  GLUCAP 124* 234* 172* 164* 157*   Iron Studies: No results for input(s): "IRON", "TIBC", "TRANSFERRIN", "FERRITIN" in the last 72 hours. @lablastinr3 @ Studies/Results: No results found. Medications:  albumin human      (feeding supplement) PROSource Plus  30 mL Oral BID BM   alosetron  1 mg Oral BID   amitriptyline  25 mg Oral QHS   atorvastatin  40 mg Oral q1800   carvedilol  3.125 mg Oral BID   cyclobenzaprine  7.5 mg Oral TID   darbepoetin (ARANESP) injection - DIALYSIS  100 mcg Subcutaneous Q Fri-1800   docusate sodium  200 mg Oral BID   DULoxetine  60 mg Oral Daily   famotidine  20 mg Oral Daily   fentaNYL  1 patch Transdermal Q72H   furosemide  80 mg Oral Daily   heparin sodium (porcine)       insulin aspart  0-9 Units Subcutaneous TID WC   insulin aspart  4 Units Subcutaneous TID WC   insulin glargine-yfgn  9 Units Subcutaneous QHS   multivitamin  1 tablet Oral QHS   pantoprazole  80 mg Oral Daily   polyethylene glycol  17 g Oral Daily   sevelamer carbonate  2,400  mg Oral TID WC   sodium zirconium cyclosilicate  10 g Oral Once per day on Sun Tue Thu Sat    Outpatient Dialysis Orders: MWF at [facility], 400/500, EDW 90kg, 2K/2Ca, LUE AVF - heparin 4000 unit IV bolus, no ESA   Assessment/Plan: Recurrent L hip osteomyelitis: S/p 08/10/21 surgery (removal hip IMN which was placed 12/2019) with persistent VRE infection. Back to OR 08/28/21, 08/31/21 for L hip debridement /wound vac (now removed). Completed 6 wk course of IV Daptomycin on 10/12/21. Per Dr. Jess Barters note 12/28/2021 "NO RESTRICTIONS L HIP".  Chronic L hip/residual limb pain: She does have fractures of L femoral neck (where hardware was removed) as well as distal femur Fx. Pain control per primary.  ESRD: Continue HD on usual MWF schedule. Clots HD system without heparin - using 4000 unit bolus and 2000 unit mid-run bolus.   Hyperkalemia: Stable on scheduled lokelma 4 days a week. HTN/volume: BP meds have been tapered down - currently on Coreg 3.125mg  BID only. Ongoing large fluids gains between HD which has been discussed extensively- volume status now improving Anemia of ESRD: Hgb 9s stable. Last tsat 54%. Continue Aranesp 163mcg weekly. Secondary HPTH: CorrCa slightly high, not on VDRA. Phos variable, usually 5-6. Continue Renvela as binder. Nutrition: Alb low, continue protein supplements.  Renal diet w/fluid restrictions.  AVF dysfunction (resolved): S/p branch ligation 09/19/2021  Dr. Virl Cagey. AVF working well now. DM2: Insulin per primary Dispo: SNF previously recommended, but she declined. Not home HD candidate.  Dialyzed in recliner on 9/4 but signed off early due to pain, has declined to come in a recliner since then.Strongly discussed on the importance of increasing her mobility as much as possible while she is in her room in addition to following her ongoing fluid restriction. Also reinforced the importance of being in the chair for HD treatments to improve strength and mobility. Our goal is to dialyze her in the mornings so the patient has more time to work with PT in the afternoons (we will do our best to accommodate this as we still need to always consider the emergent cases).  Encouraged ongoing progress. Per Renal Navigator notes there are no clinics that will accept patient unless she will have HD in chair. Will continue to encourage use of chair for HD  Anice Paganini, PA-C 01/06/2022, 9:13 AM  [facility] Pager: [phone]

## 2022-01-06 NOTE — Procedures (Signed)
Patient seen on Hemodialysis. BP (!) 154/56   Pulse (!) 56   Temp 98.1 F (36.7 C) (Oral)   Resp 10   Ht 5\' 10"  (1.778 m)   Wt 97.5 kg   SpO2 100%   BMI 30.84 kg/m   QB 400, UF goal 4L Tolerating treatment without complaints at this time.   Elmarie Shiley MD [facility]. Office # [phone] Pager # [phone] 10:20 AM

## 2022-01-06 NOTE — Progress Notes (Signed)
   01/06/22 1242  Vitals  Temp 98.1 F (36.7 C)  Pulse Rate (!) 58  Resp (!) 9  BP (!) 126/50  SpO2 100 %  O2 Device Room Air  Weight 93.7 kg  Type of Weight Post-Dialysis  Oxygen Therapy  Patient Activity (if Appropriate) In bed  Pulse Oximetry Type Continuous  Post Treatment  Dialyzer Clearance Lightly streaked  Duration of HD Treatment -hour(s) 4 hour(s)  Liters Processed 96  Fluid Removed (mL) 4000 mL  Tolerated HD Treatment Yes  Post-Hemodialysis Comments HD goal met, tolerated well, no c/o  AVG/AVF Arterial Site Held (minutes) 10 minutes  AVG/AVF Venous Site Held (minutes) 10 minutes   Received patient in bed to unit.  Alert and oriented.  Informed consent signed and in chart.     Patient tolerated well.  Transported back to the room  Alert, without acute distress.  Hand-off given to patient's nurse.   Access used: LAVF Access issues: none  Total UF removed: 4L Medication(s) given: none    [facility]

## 2022-01-06 NOTE — Progress Notes (Addendum)
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  Orthopedic/ID were consulted.  She was started on broad-spectrum antibiotics.  Underwent multiple I&D's by Dr. [name] along with local tissue rearrangement.  She was also seen by vascular surgery who did not fistula branch ligation.  She has completed 6-week course of daptomycin on 10/09/2021.    Assessment & Plan:   Subacute osteomyelitis of left femur with abscess -Status post surgical intervention by Dr. [name].  She has completed 6-week course of daptomycin on 10/09/2021.  Outpatient follow-up with Dr. [name] 1 week after discharge -Continue current pain management regimen along with muscle relaxants. -Reimaging on 12/02/2021 of left knee showed healing fracture with callus -Seen by Dr. [name] again 10/28: looks to be doing well. No further left hip restriction.   Right thigh mild cellulitis -Controlled.  Completed 5-day course of antibiotics on 12/18/2021.  Symptoms most likely from anasarca.  History of left intertrochanteric fracture -Left hip hardware removed on 08/10/2021 prior to admission   End-stage renal disease on hemodialysis Anasarca -Nephrology following.  Dialysis as per nephrology schedule.  Still on Lasix.  Strict input and output with daily weights.  Fluid restriction -Has to do hemodialysis in chair prior  to discharge to outpatient hemodialysis  Hyponatremia -Management by hemodialysis.  Monitor intermittently.  Hyperphosphatemia--continue sevelamer.  Nephrology following.  Monitor intermittently.  Anemia of chronic disease -Hemoglobin currently stable.  Monitor intermittently  Diabetes mellitus type 2 with hyperglycemia -Continue long-acting insulin, NovoLog with meals along with CBGs with SSI  Hypertension -Monitor blood pressure.  Continue Coreg, hydralazine, Lasix.    History of unspecified CVA Hyperlipidemia -Continue Lipitor  GERD -Continue Protonix and Pepcid  Tobacco use -Tobacco cessation was discussed by prior hospitalist during this hospitalization  Anxiety and depression -Continue amitriptyline and Cymbalta  Goals of care -Prognosis is guarded to poor.  Remains full code.  Palliative care has already evaluated the patient during this hospitalization   DVT prophylaxis: SCDs Code Status: Full Family Communication: None at bedside Disposition Plan: Status is: Inpatient Remains inpatient appropriate because: Difficult placement due to inability to sit in chair for hemodialysis  Consultants: Orthopedics/nephrology/vascular surgery/ID  Procedures: As above  Antimicrobials:  Anti-infectives (From admission, onward)    Start     Dose/Rate Route Frequency Ordered Stop   12/15/21 1730  cefTRIAXone (ROCEPHIN) 2 g in sodium chloride 0.9 % 100 mL IVPB        2 g 200 mL/hr over 30 Minutes Intravenous Every 24 hours 12/15/21 1633 12/18/21 1750   12/14/21 1800  doxycycline (VIBRA-TABS) tablet 100 mg  Status:  Discontinued        100 mg Oral Every 12 hours 12/14/21 1607 12/15/21 1633   10/02/21 2000  DAPTOmycin (CUBICIN) 900 mg in sodium chloride 0.9 % IVPB  Status:  Discontinued        10 mg/kg  92.4 kg 136 mL/hr over 30 Minutes Intravenous Once per day on Mon Wed Fri 10/01/21 1350 10/12/21 1146  09/30/21 2000  DAPTOmycin (CUBICIN) 900 mg in sodium chloride 0.9 %  IVPB  Status:  Discontinued        10 mg/kg  92.4 kg 136 mL/hr over 30 Minutes Intravenous Daily 09/28/21 0919 10/01/21 1350   09/28/21 2000  DAPTOmycin (CUBICIN) 900 mg in sodium chloride 0.9 % IVPB        900 mg 136 mL/hr over 30 Minutes Intravenous  Once 09/28/21 0919 09/28/21 2146   09/02/21 2000  DAPTOmycin (CUBICIN) 900 mg in sodium chloride 0.9 % IVPB  Status:  Discontinued        10 mg/kg  92.4 kg 136 mL/hr over 30 Minutes Intravenous Once per day on Mon Wed Fri 09/02/21 0836 09/28/21 0919   08/31/21 0745  ceFAZolin (ANCEF) IVPB 2g/100 mL premix        2 g 200 mL/hr over 30 Minutes Intravenous To Short Stay 08/31/21 0730 08/31/21 0918   08/30/21 0600  ceFAZolin (ANCEF) IVPB 2g/100 mL premix  Status:  Discontinued        2 g 200 mL/hr over 30 Minutes Intravenous To Short Stay 08/30/21 0123 08/31/21 0600   08/29/21 2000  DAPTOmycin (CUBICIN) 900 mg in sodium chloride 0.9 % IVPB  Status:  Discontinued        10 mg/kg  92.4 kg 136 mL/hr over 30 Minutes Intravenous Every 48 hours 08/29/21 1627 09/02/21 0836   08/28/21 1800  ceFAZolin (ANCEF) IVPB 2g/100 mL premix  Status:  Discontinued        2 g 200 mL/hr over 30 Minutes Intravenous Every M-W-F (1800) 08/27/21 0846 08/29/21 1620   08/28/21 1200  ceFAZolin (ANCEF) IVPB 1 g/50 mL premix  Status:  Discontinued        1 g 100 mL/hr over 30 Minutes Intravenous Every M-W-F (Hemodialysis) 08/26/21 1954 08/27/21 0846   08/28/21 0600  ceFAZolin (ANCEF) IVPB 2g/100 mL premix        2 g 200 mL/hr over 30 Minutes Intravenous On call to O.R. 08/27/21 1150 08/28/21 0909   08/27/21 1800  ceFAZolin (ANCEF) IVPB 1 g/50 mL premix        1 g 100 mL/hr over 30 Minutes Intravenous  Once 08/26/21 1954 08/27/21 2048   08/26/21 1500  ceFEPIme (MAXIPIME) 1 g in sodium chloride 0.9 % 100 mL IVPB  Status:  Discontinued        1 g 200 mL/hr over 30 Minutes Intravenous Every 24 hours 08/26/21 1452 08/26/21 1714        Subjective: Patient seen and  examined at bedside undergoing hemodialysis. Poor historian.  No fever, vomiting, worsening shortness of breath reported. Objective: Vitals:   01/05/22 0920 01/05/22 1806 01/05/22 2107 01/06/22 0527  BP: (!) 179/58 (!) 156/55 (!) 130/51 (!) 170/65  Pulse: 62 60 (!) 57 (!) 58  Resp:   18 17  Temp: 98.8 F (37.1 C) 98.2 F (36.8 C) 98.2 F (36.8 C) 98.3 F (36.8 C)  TempSrc: Oral Oral    SpO2: 91% 98% 99% 100%  Weight:    94.1 kg  Height:        Intake/Output Summary (Last 24 hours) at 01/06/2022 0740 Last data filed at 01/06/2022 0600 Gross per 24 hour  Intake 1300 ml  Output 0 ml  Net 1300 ml    Filed Weights   01/04/22 0500 01/05/22 0512 01/06/22 0527  Weight: 94.8 kg 94.3 kg 94.1 kg    Examination:  General exam: Currently on room air.  No distress currently.  Chronically ill and deconditioned.  Still slow to respond.  Poor historian. Respiratory system: Bilateral decreased breath sounds at bases with some crackles  cardiovascular system: S1-S2 heard; rate controlled currently  gastrointestinal system: Abdomen is still distended; soft and nontender.  Bowel sounds are heard extremities: Left BKA with dressing present  Data Reviewed: I have personally reviewed following labs and imaging studies  CBC: Recent Labs  Lab 01/01/22 0813  WBC 9.8  HGB 9.6*  HCT 29.9*  MCV 87.4  PLT 811    Basic Metabolic Panel: Recent Labs  Lab 01/01/22 0813 01/01/22 0916  NA 127*  --   K 4.8  --   CL 85*  --   CO2 25  --   GLUCOSE 150*  --   BUN 69*  --   CREATININE 6.23*  --   CALCIUM 9.7  --   MG 1.8  --   PHOS  --  5.3*    GFR: Estimated Creatinine Clearance: 12.8 mL/min (A) (by C-G formula based on SCr of 6.23 mg/dL (H)). Liver Function Tests: No results for input(s): "AST", "ALT", "ALKPHOS", "BILITOT", "PROT", "ALBUMIN" in the last 168 hours.  No results for input(s): "LIPASE", "AMYLASE" in the last 168 hours. No results for input(s): "AMMONIA" in the last 168  hours. Coagulation Profile: No results for input(s): "INR", "PROTIME" in the last 168 hours. Cardiac Enzymes: No results for input(s): "CKTOTAL", "CKMB", "CKMBINDEX", "TROPONINI" in the last 168 hours. BNP (last 3 results) No results for input(s): "PROBNP" in the last 8760 hours. HbA1C: No results for input(s): "HGBA1C" in the last 72 hours. CBG: Recent Labs  Lab 01/05/22 0726 01/05/22 1118 01/05/22 1615 01/05/22 2106 01/06/22 0736  GLUCAP 124* 234* 172* 164* 157*    Lipid Profile: No results for input(s): "CHOL", "HDL", "LDLCALC", "TRIG", "CHOLHDL", "LDLDIRECT" in the last 72 hours. Thyroid Function Tests: No results for input(s): "TSH", "T4TOTAL", "FREET4", "T3FREE", "THYROIDAB" in the last 72 hours. Anemia Panel: No results for input(s): "VITAMINB12", "FOLATE", "FERRITIN", "TIBC", "IRON", "RETICCTPCT" in the last 72 hours. Sepsis Labs: No results for input(s): "PROCALCITON", "LATICACIDVEN" in the last 168 hours.  No results found for this or any previous visit (from the past 240 hour(s)).       Radiology Studies: No results found.      Scheduled Meds:  (feeding supplement) PROSource Plus  30 mL Oral BID BM   alosetron  1 mg Oral BID   amitriptyline  25 mg Oral QHS   atorvastatin  40 mg Oral q1800   carvedilol  3.125 mg Oral BID   cyclobenzaprine  7.5 mg Oral TID   darbepoetin (ARANESP) injection - DIALYSIS  100 mcg Subcutaneous Q Fri-1800   docusate sodium  200 mg Oral BID   DULoxetine  60 mg Oral Daily   famotidine  20 mg Oral Daily   fentaNYL  1 patch Transdermal Q72H   furosemide  80 mg Oral Daily   insulin aspart  0-9 Units Subcutaneous TID WC   insulin aspart  4 Units Subcutaneous TID WC   insulin glargine-yfgn  9 Units Subcutaneous QHS   multivitamin  1 tablet Oral QHS   pantoprazole  80 mg Oral Daily   polyethylene glycol  17 g Oral Daily   sevelamer carbonate  2,400 mg Oral TID WC   sodium zirconium cyclosilicate  10 g Oral Once per day on Sun  Tue Thu Sat   Continuous Infusions:  albumin human  Aline August, MD [facility] 01/06/2022, 7:40 AM

## 2022-01-06 NOTE — Progress Notes (Signed)
Pt received in bed no c/os no distress noted avf +/+ ufg 4L

## 2022-01-07 DIAGNOSIS — I10 Essential (primary) hypertension: Secondary | ICD-10-CM | POA: Diagnosis not present

## 2022-01-07 DIAGNOSIS — M86252 Subacute osteomyelitis, left femur: Secondary | ICD-10-CM | POA: Diagnosis not present

## 2022-01-07 DIAGNOSIS — T8131XS Disruption of external operation (surgical) wound, not elsewhere classified, sequela: Secondary | ICD-10-CM | POA: Diagnosis not present

## 2022-01-07 DIAGNOSIS — N186 End stage renal disease: Secondary | ICD-10-CM | POA: Diagnosis not present

## 2022-01-07 LAB — GLUCOSE, CAPILLARY
Glucose-Capillary: 167 mg/dL — ABNORMAL HIGH (ref 70–99)
Glucose-Capillary: 249 mg/dL — ABNORMAL HIGH (ref 70–99)
Glucose-Capillary: 196 mg/dL — ABNORMAL HIGH (ref 70–99)
Glucose-Capillary: 258 mg/dL — ABNORMAL HIGH (ref 70–99)

## 2022-01-07 MED ORDER — CHLORHEXIDINE GLUCONATE CLOTH 2 % EX PADS
6.0000 | MEDICATED_PAD | Freq: Every day | CUTANEOUS | Status: DC
  Administered 2022-01-07 – 2022-01-08 (×2): 6 via TOPICAL

## 2022-01-07 NOTE — Progress Notes (Signed)
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  Orthopedic/ID were consulted.  She was started on broad-spectrum antibiotics.  Underwent multiple I&D's by Dr. [name] along with local tissue rearrangement.  She was also seen by vascular surgery who did not fistula branch ligation.  She has completed 6-week course of daptomycin on 10/09/2021.  She is unable to complete hemodialysis in chair and said outpatient hemodialysis unit has been hard to find.  She is otherwise medically stable for discharge.  Assessment & Plan:   Subacute osteomyelitis of left femur with abscess -Status post surgical intervention by Dr. [name].  She has completed 6-week course of daptomycin on 10/09/2021.  Outpatient follow-up with Dr. [name] 1 week after discharge -Continue current pain management regimen along with muscle relaxants. -Reimaging on 12/02/2021 of left knee showed healing fracture with callus -Seen by Dr. [name] again 10/28: looks to be doing well. No further left hip restriction.   Right thigh mild cellulitis -Controlled.  Completed 5-day course of antibiotics on 12/18/2021.  Symptoms most likely from anasarca.  History of left intertrochanteric fracture -Left hip hardware removed on 08/10/2021 prior to admission   End-stage renal disease on hemodialysis Anasarca -Nephrology following.   Dialysis as per nephrology schedule.  Still on Lasix.  Strict input and output with daily weights.  Fluid restriction -Has to do hemodialysis in chair prior to discharge to outpatient hemodialysis  Hyponatremia -Management by hemodialysis.  Monitor intermittently.  Hyperphosphatemia--continue sevelamer.  Nephrology following.  Monitor intermittently.  Anemia of chronic disease -Hemoglobin currently stable.  Monitor intermittently  Diabetes mellitus type 2 with hyperglycemia -Continue long-acting insulin, NovoLog with meals along with CBGs with SSI  Hypertension -Monitor blood pressure.  Continue Coreg, hydralazine, Lasix.    History of unspecified CVA Hyperlipidemia -Continue Lipitor  GERD -Continue Protonix and Pepcid  Tobacco use -Tobacco cessation was discussed by prior hospitalist during this hospitalization  Anxiety and depression -Continue amitriptyline and Cymbalta  Goals of care -Prognosis is guarded to poor.  Remains full code.  Palliative care has already evaluated the patient during this hospitalization   DVT prophylaxis: SCDs Code Status: Full Family Communication: None at bedside Disposition Plan: Status is: Inpatient Remains inpatient appropriate because: Difficult placement due to inability to sit in chair for hemodialysis  Consultants: Orthopedics/nephrology/vascular surgery/ID  Procedures: As above  Antimicrobials:  Anti-infectives (From admission, onward)    Start     Dose/Rate Route Frequency Ordered Stop   12/15/21 1730  cefTRIAXone (ROCEPHIN) 2 g in sodium chloride 0.9 % 100 mL IVPB        2 g 200 mL/hr over 30 Minutes Intravenous Every 24 hours 12/15/21 1633 12/18/21 1750   12/14/21 1800  doxycycline (VIBRA-TABS) tablet 100 mg  Status:  Discontinued        100 mg Oral Every 12 hours 12/14/21 1607 12/15/21 1633   10/02/21 2000  DAPTOmycin (CUBICIN) 900 mg in sodium chloride 0.9 % IVPB  Status:  Discontinued  10 mg/kg  92.4 kg 136  mL/hr over 30 Minutes Intravenous Once per day on Mon Wed Fri 10/01/21 1350 10/12/21 1146   09/30/21 2000  DAPTOmycin (CUBICIN) 900 mg in sodium chloride 0.9 % IVPB  Status:  Discontinued        10 mg/kg  92.4 kg 136 mL/hr over 30 Minutes Intravenous Daily 09/28/21 0919 10/01/21 1350   09/28/21 2000  DAPTOmycin (CUBICIN) 900 mg in sodium chloride 0.9 % IVPB        900 mg 136 mL/hr over 30 Minutes Intravenous  Once 09/28/21 0919 09/28/21 2146   09/02/21 2000  DAPTOmycin (CUBICIN) 900 mg in sodium chloride 0.9 % IVPB  Status:  Discontinued        10 mg/kg  92.4 kg 136 mL/hr over 30 Minutes Intravenous Once per day on Mon Wed Fri 09/02/21 0836 09/28/21 0919   08/31/21 0745  ceFAZolin (ANCEF) IVPB 2g/100 mL premix        2 g 200 mL/hr over 30 Minutes Intravenous To Short Stay 08/31/21 0730 08/31/21 0918   08/30/21 0600  ceFAZolin (ANCEF) IVPB 2g/100 mL premix  Status:  Discontinued        2 g 200 mL/hr over 30 Minutes Intravenous To Short Stay 08/30/21 0123 08/31/21 0600   08/29/21 2000  DAPTOmycin (CUBICIN) 900 mg in sodium chloride 0.9 % IVPB  Status:  Discontinued        10 mg/kg  92.4 kg 136 mL/hr over 30 Minutes Intravenous Every 48 hours 08/29/21 1627 09/02/21 0836   08/28/21 1800  ceFAZolin (ANCEF) IVPB 2g/100 mL premix  Status:  Discontinued        2 g 200 mL/hr over 30 Minutes Intravenous Every M-W-F (1800) 08/27/21 0846 08/29/21 1620   08/28/21 1200  ceFAZolin (ANCEF) IVPB 1 g/50 mL premix  Status:  Discontinued        1 g 100 mL/hr over 30 Minutes Intravenous Every M-W-F (Hemodialysis) 08/26/21 1954 08/27/21 0846   08/28/21 0600  ceFAZolin (ANCEF) IVPB 2g/100 mL premix        2 g 200 mL/hr over 30 Minutes Intravenous On call to O.R. 08/27/21 1150 08/28/21 0909   08/27/21 1800  ceFAZolin (ANCEF) IVPB 1 g/50 mL premix        1 g 100 mL/hr over 30 Minutes Intravenous  Once 08/26/21 1954 08/27/21 2048   08/26/21 1500  ceFEPIme (MAXIPIME) 1 g in sodium chloride 0.9 % 100 mL IVPB   Status:  Discontinued        1 g 200 mL/hr over 30 Minutes Intravenous Every 24 hours 08/26/21 1452 08/26/21 1714        Subjective: Patient seen and examined at bedside. Poor historian.  Denies worsening shortness of breath, fever, vomiting or abdominal pain.  Complains of hip pain. Objective: Vitals:   01/06/22 1338 01/06/22 1655 01/06/22 2224 01/07/22 0414  BP: (!) 168/60 (!) 136/51 (!) 150/57 (!) 140/52  Pulse: (!) 58 60 60 (!) 57  Resp:  18 16 16   Temp: 98.6 F (37 C) 98.4 F (36.9 C) 98 F (36.7 C) (!) 97.5 F (36.4 C)  TempSrc: Oral Oral Oral Oral  SpO2: 100% 98% 99% 100%  Weight:      Height:        Intake/Output Summary (Last 24 hours) at 01/07/2022 0753 Last data filed at 01/06/2022 1715 Gross per 24 hour  Intake 900 ml  Output 4500 ml  Net -3600 ml    [employer]  01/06/22 0527 01/06/22 0827 01/06/22 1242  Weight: 94.1 kg 97.5 kg 93.7 kg    Examination:  General exam: No acute distress.  On room air currently.  Chronically ill and deconditioned.  Still slow to respond.  Poor historian. Respiratory system: Bilateral decreased breath sounds at bases with scattered crackles  cardiovascular system: Rate controlled mostly; S1 and S2 heard gastrointestinal system: Abdomen is still slightly distended; soft and nontender.  Normal bowel sounds heard  extremities: Left BKA with dressing present  Data Reviewed: I have personally reviewed following labs and imaging studies  CBC: Recent Labs  Lab 01/01/22 0813  WBC 9.8  HGB 9.6*  HCT 29.9*  MCV 87.4  PLT 546    Basic Metabolic Panel: Recent Labs  Lab 01/01/22 0813 01/01/22 0916  NA 127*  --   K 4.8  --   CL 85*  --   CO2 25  --   GLUCOSE 150*  --   BUN 69*  --   CREATININE 6.23*  --   CALCIUM 9.7  --   MG 1.8  --   PHOS  --  5.3*    GFR: Estimated Creatinine Clearance: 12.8 mL/min (A) (by C-G formula based on SCr of 6.23 mg/dL (H)). Liver Function Tests: No results for input(s): "AST",  "ALT", "ALKPHOS", "BILITOT", "PROT", "ALBUMIN" in the last 168 hours.  No results for input(s): "LIPASE", "AMYLASE" in the last 168 hours. No results for input(s): "AMMONIA" in the last 168 hours. Coagulation Profile: No results for input(s): "INR", "PROTIME" in the last 168 hours. Cardiac Enzymes: No results for input(s): "CKTOTAL", "CKMB", "CKMBINDEX", "TROPONINI" in the last 168 hours. BNP (last 3 results) No results for input(s): "PROBNP" in the last 8760 hours. HbA1C: No results for input(s): "HGBA1C" in the last 72 hours. CBG: Recent Labs  Lab 01/05/22 1615 01/05/22 2106 01/06/22 0736 01/06/22 1338 01/06/22 1650  GLUCAP 172* 164* 157* 178* 224*    Lipid Profile: No results for input(s): "CHOL", "HDL", "LDLCALC", "TRIG", "CHOLHDL", "LDLDIRECT" in the last 72 hours. Thyroid Function Tests: No results for input(s): "TSH", "T4TOTAL", "FREET4", "T3FREE", "THYROIDAB" in the last 72 hours. Anemia Panel: No results for input(s): "VITAMINB12", "FOLATE", "FERRITIN", "TIBC", "IRON", "RETICCTPCT" in the last 72 hours. Sepsis Labs: No results for input(s): "PROCALCITON", "LATICACIDVEN" in the last 168 hours.  No results found for this or any previous visit (from the past 240 hour(s)).       Radiology Studies: No results found.      Scheduled Meds:  (feeding supplement) PROSource Plus  30 mL Oral BID BM   alosetron  1 mg Oral BID   amitriptyline  25 mg Oral QHS   atorvastatin  40 mg Oral q1800   carvedilol  3.125 mg Oral BID   cyclobenzaprine  7.5 mg Oral TID   darbepoetin (ARANESP) injection - DIALYSIS  100 mcg Subcutaneous Q Fri-1800   docusate sodium  200 mg Oral BID   DULoxetine  60 mg Oral Daily   famotidine  20 mg Oral Daily   fentaNYL  1 patch Transdermal Q72H   furosemide  80 mg Oral Daily   insulin aspart  0-9 Units Subcutaneous TID WC   insulin aspart  4 Units Subcutaneous TID WC   insulin glargine-yfgn  9 Units Subcutaneous QHS   multivitamin  1 tablet  Oral QHS   pantoprazole  80 mg Oral Daily   polyethylene glycol  17 g Oral Daily   sevelamer carbonate  2,400 mg Oral TID WC  sodium zirconium cyclosilicate  10 g Oral Once per day on Sun Tue Thu Sat   Continuous Infusions:  albumin human            Aline August, MD [facility] 01/07/2022, 7:53 AM

## 2022-01-07 NOTE — Progress Notes (Addendum)
Contacted [facility] in [place]. Facility does on site HD for pts but pts must be able to tolerate HD in the chair. [facility] in [place], [place] and they do not provide on site HD.   Melven Sartorius Renal Navigator [phone]

## 2022-01-07 NOTE — Progress Notes (Signed)
Physical Therapy Treatment Patient Details Name: [name] MRN: [id number] DOB: 10-Dec-1965 Today's Date: 01/07/2022   History of Present Illness 56 y.o. female presented to ED 6/26 from dialysis with increased bloody drainage from L hip wound. 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 s/p hardware removal of left hip with partial resection of tuft tissue and femure that were nonviable on 08/10/21 Discharged home 6/23. underwent L hip debridement and wound vac placement 6/28; +Left intertrochanteric non-union,  Fracture of left distal femur  PMH: hypertension, hyperlipidemia, ESRD on HD MWF, history of left BKA in 2021, depression/anxiety, stroke, tobacco use, T2DM,  insomnia, chronic pain syndrome,    PT Comments    Continuing work on functional mobility and activity tolerance;  session focused on therex aimed at stretching L hip and knee and strengthening bil hips; bridges with LLE bolstered, L hip extension in R sidelying with overpressure/stretch into hip extension, and L hip abduction in R sidelying; Also performed stretches in sitting to help Internally rotate L hip towards neutral and extend knee; Used prosthesis for seated stretches (even though it did not fully fit) for a bigger lever arm with which to stretch;   Then worked on pushing up sidelying to sit from R elbow prop and L elbow prop, as a building block to helping Almyra be independent with managing a sliding board; Performed lateral scoot transfer bed to recliner, and pt was able to lift the sliding board out from under her; in recliner, used bolsters at lateral L knee and medial distal aspect of her LLE to get LLE closer to neutral at rest;  We briefly talked about getting a drop-arm BSC in her room to use instead of a purewick -- this would encourage more normal ADLs along with good practice with transfers; Encouraged pt to have family bring her wheelchair in so that she can have more  independence, and more options for getting up than just the bed and the recliner -- with the wheelchair, we will open up the possibility of a shower.  Recommendations for follow up therapy are one component of a multi-disciplinary discharge planning process, led by the attending physician.  Recommendations may be updated based on patient status, additional functional criteria and insurance authorization.  Follow Up Recommendations  Skilled nursing-short term rehab (<3 hours/day) (pt currently refusing due to inability to tolerate sitting in chair for 6+ hours as transported to/from dialysis) Can patient physically be transported by private vehicle: No   Assistance Recommended at Discharge Intermittent Supervision/Assistance  Patient can return home with the following A lot of help with bathing/dressing/bathroom;Assistance with cooking/housework;Assist for transportation;Two people to help with walking and/or transfers;Help with stairs or ramp for entrance   Equipment Recommendations  None recommended by PT    Recommendations for Other Services OT consult     Precautions / Restrictions Precautions Precautions: Fall;Other (comment) Precaution Comments: L BKA (baseline) Restrictions LLE Weight Bearing: Weight bearing as tolerated Other Position/Activity Restrictions: L distal femur fx; per Dr [name] 8/10 pt can full WB     Mobility  Bed Mobility Overal bed mobility: Needs Assistance Bed Mobility: Supine to Sit Rolling: Modified independent (Device/Increase time) Sidelying to sit: Supervision       General bed mobility comments: HOB elevated. Incr time to push up to sit; pt struggled with pushing up from R side; eventually, able to weight shift trun forward enough to push up to sit without phsyical assist    Transfers Overall transfer level:  Needs assistance Equipment used: Sliding board Transfers: Bed to chair/wheelchair/BSC            Lateral/Scoot Transfers: Min  assist General transfer comment: continues to require assist to place sliding board. Requires multiple small scoots to complete transfer; able to take sliding board out once in chair without assist    Ambulation/Gait                   Stairs             Wheelchair Mobility    Modified Rankin (Stroke Patients Only)       Balance                                            Cognition Arousal/Alertness: Awake/alert Behavior During Therapy: WFL for tasks assessed/performed Overall Cognitive Status: Within Functional Limits for tasks assessed                                          Exercises Other Exercises Other Exercises: Bridging with LLE bolstered x10 Other Exercises: sidelying L hip extension with stretching overpressure provided by PT, 8 long reps Other Exercises: seated knee extension and hip internal rotation stretching with prosthesis Other Exercises: lateral leans in sitting, pushing up from elbow prop to upright 5 reps each side    General Comments General comments (skin integrity, edema, etc.): Donned prosthesis liner with incr time; sitting EOB, might be easier to don laying down with LLE lifted up; partially donned prosthesis, though unable to get it fully on; Prosthesis provided a better lever arm to stretch hip and knee with      Pertinent Vitals/Pain Pain Assessment Pain Assessment: Faces Faces Pain Scale: Hurts even more Pain Location: L hip/LLE with stretching into internal rotation towards neutral and knee extension stretching (prosthesis on, but not "clicked in") Pain Descriptors / Indicators: Discomfort, Grimacing, Guarding Pain Intervention(s): Limited activity within patient's tolerance    Home Living                          Prior Function            PT Goals (current goals can now be found in the care plan section) Acute Rehab PT Goals Patient Stated Goal: less pain with movement PT  Goal Formulation: With patient Time For Goal Achievement: 01/09/22 Potential to Achieve Goals: Fair Progress towards PT goals: Progressing toward goals    Frequency    Min 2X/week      PT Plan Current plan remains appropriate    Co-evaluation              AM-PAC PT "6 Clicks" Mobility   Outcome Measure  Help needed turning from your back to your side while in a flat bed without using bedrails?: None Help needed moving from lying on your back to sitting on the side of a flat bed without using bedrails?: None Help needed moving to and from a bed to a chair (including a wheelchair)?: A Little Help needed standing up from a chair using your arms (e.g., wheelchair or bedside chair)?: Total Help needed to walk in hospital room?: Total Help needed climbing 3-5 steps with a railing? : Total 6 Click Score: 14  End of Session Equipment Utilized During Treatment: Other (comment) (sliding board) Activity Tolerance: Patient tolerated treatment well Patient left: in chair;with call bell/phone within reach;Other (comment) Nurse Communication: Mobility status PT Visit Diagnosis: Other abnormalities of gait and mobility (R26.89);Muscle weakness (generalized) (M62.81);History of falling (Z91.81);Pain Pain - Right/Left: Left Pain - part of body: Leg     Time: 7949-9718 PT Time Calculation (min) (ACUTE ONLY): 36 min  Charges:  $Therapeutic Exercise: 8-22 mins $Therapeutic Activity: 8-22 mins                     Roney Marion, PT  Acute Rehabilitation Services Office [phone]    Colletta [place] 01/07/2022, 3:18 PM

## 2022-01-07 NOTE — Progress Notes (Signed)
[facility] Progress Note   Subjective:   No issues with HD yesterday. Upset about pain meds being reduced, says it is harder to sit in chair now. Will defer pain management to primary team. Denies SOB, CP, dizziness and nausea.   Objective Vitals:   01/06/22 1655 01/06/22 2224 01/07/22 0414 01/07/22 0805  BP: (!) 136/51 (!) 150/57 (!) 140/52 (!) 154/52  Pulse: 60 60 (!) 57 64  Resp: 18 16 16 18   Temp: 98.4 F (36.9 C) 98 F (36.7 C) (!) 97.5 F (36.4 C) 98.8 F (37.1 C)  TempSrc: Oral Oral Oral   SpO2: 98% 99% 100% 100%  Weight:      Height:       Physical Exam General: [name] Heart: RRR, no murmurs, rubs or gallops Lungs: CTA bilaterally, no wheezing, rhonchi or rales Abdomen: Soft, non-distended, +BS Extremities: trace edema L hip Dialysis Access:  AVF + bruit  Additional Objective Labs: Basic Metabolic Panel: Recent Labs  Lab 01/01/22 0813 01/01/22 0916  NA 127*  --   K 4.8  --   CL 85*  --   CO2 25  --   GLUCOSE 150*  --   BUN 69*  --   CREATININE 6.23*  --   CALCIUM 9.7  --   PHOS  --  5.3*   Liver Function Tests: No results for input(s): "AST", "ALT", "ALKPHOS", "BILITOT", "PROT", "ALBUMIN" in the last 168 hours. No results for input(s): "LIPASE", "AMYLASE" in the last 168 hours. CBC: Recent Labs  Lab 01/01/22 0813  WBC 9.8  HGB 9.6*  HCT 29.9*  MCV 87.4  PLT 201   Blood Culture    Component Value Date/Time   SDES TISSUE 08/28/2021 0917   SPECREQUEST LEFT THIGH TISSUE 08/28/2021 0917   CULT  08/28/2021 0917    MODERATE ENTEROCOCCUS FAECIUM VANCOMYCIN RESISTANT ENTEROCOCCUS ISOLATED NO ANAEROBES ISOLATED Sent to [facility] for further susceptibility testing. Performed at [facility], [place] [address], [place], [place] [address]    REPTSTATUS 09/02/2021 FINAL 08/28/2021 9476    Cardiac Enzymes: No results for input(s): "CKTOTAL", "CKMB", "CKMBINDEX", "TROPONINI" in the last 168 hours. CBG: Recent Labs   Lab 01/05/22 2106 01/06/22 0736 01/06/22 1338 01/06/22 1650 01/07/22 0806  GLUCAP 164* 157* 178* 224* 167*   Iron Studies: No results for input(s): "IRON", "TIBC", "TRANSFERRIN", "FERRITIN" in the last 72 hours. @lablastinr3 @ Studies/Results: No results found. Medications:  albumin human      (feeding supplement) PROSource Plus  30 mL Oral BID BM   alosetron  1 mg Oral BID   amitriptyline  25 mg Oral QHS   atorvastatin  40 mg Oral q1800   carvedilol  3.125 mg Oral BID   cyclobenzaprine  7.5 mg Oral TID   darbepoetin (ARANESP) injection - DIALYSIS  100 mcg Subcutaneous Q Fri-1800   docusate sodium  200 mg Oral BID   DULoxetine  60 mg Oral Daily   famotidine  20 mg Oral Daily   fentaNYL  1 patch Transdermal Q72H   furosemide  80 mg Oral Daily   insulin aspart  0-9 Units Subcutaneous TID WC   insulin aspart  4 Units Subcutaneous TID WC   insulin glargine-yfgn  9 Units Subcutaneous QHS   multivitamin  1 tablet Oral QHS   pantoprazole  80 mg Oral Daily   polyethylene glycol  17 g Oral Daily   sevelamer carbonate  2,400 mg Oral TID WC   sodium zirconium cyclosilicate  10 g Oral Once per day on Sun Tue Thu Sat    Outpatient Dialysis Orders: MWF at [facility], 400/500, EDW 90kg, 2K/2Ca, LUE AVF - heparin 4000 unit IV bolus, no ESA  Assessment/Plan: Recurrent L hip osteomyelitis: S/p 08/10/21 surgery (removal hip IMN which was placed 12/2019) with persistent VRE infection. Back to OR 08/28/21, 08/31/21 for L hip debridement /wound vac (now removed). Completed 6 wk course of IV Daptomycin on 10/12/21. Per Dr. Jess Barters note 12/28/2021 "NO RESTRICTIONS L HIP".  Chronic L hip/residual limb pain: She does have fractures of L femoral neck (where hardware was removed) as well as distal femur Fx. Pain control per primary.  ESRD: Continue HD on usual MWF schedule. Clots HD system without heparin - using 4000 unit bolus and 2000 unit mid-run bolus.  Hyperkalemia: Stable on scheduled  lokelma 4 days a week. HTN/volume: BP meds have been tapered down - currently on Coreg 3.125mg  BID only. Ongoing large fluids gains between HD which has been discussed extensively- volume status now improving Anemia of ESRD: Hgb 9s stable. Last tsat 54%. Continue Aranesp 175mcg weekly. Secondary HPTH: CorrCa slightly high, not on VDRA. Phos variable, usually 5-6. Continue Renvela as binder. Nutrition: Alb low, continue protein supplements.  Renal diet w/fluid restrictions.  AVF dysfunction (resolved): S/p branch ligation 09/19/2021  Dr. Virl Cagey. AVF working well now. DM2: Insulin per primary Dispo: SNF previously recommended, but she declined. Not home HD candidate.  Dialyzed in recliner on 9/4 but signed off early due to pain, has declined to come in a recliner since then.Strongly discussed on the importance of increasing her mobility as much as possible while she is in her room in addition to following her ongoing fluid restriction. Also reinforced the importance of being in the chair for HD treatments to improve strength and mobility. Our goal is to dialyze her in the mornings so the patient has more time to work with PT in the afternoons (we will do our best to accommodate this as we still need to always consider the emergent cases).  Encouraged ongoing progress. Per Renal Navigator notes there are no clinics that will accept patient unless she will have HD in chair. Will continue to encourage use of chair for HD  Anice Paganini, PA-C 01/07/2022, 8:52 AM  [facility] Pager: [phone]

## 2022-01-07 NOTE — Progress Notes (Signed)
Physical Therapy Note  Obtained verbal Order from Dr. Starla Link for a Prosthetist Consult to evaluate LLE prosthesis fit;   Roney Marion, [place] Office [phone]

## 2022-01-08 DIAGNOSIS — I10 Essential (primary) hypertension: Secondary | ICD-10-CM | POA: Diagnosis not present

## 2022-01-08 DIAGNOSIS — M86252 Subacute osteomyelitis, left femur: Secondary | ICD-10-CM | POA: Diagnosis not present

## 2022-01-08 DIAGNOSIS — N186 End stage renal disease: Secondary | ICD-10-CM | POA: Diagnosis not present

## 2022-01-08 DIAGNOSIS — T8131XS Disruption of external operation (surgical) wound, not elsewhere classified, sequela: Secondary | ICD-10-CM | POA: Diagnosis not present

## 2022-01-08 LAB — GLUCOSE, CAPILLARY
Glucose-Capillary: 162 mg/dL — ABNORMAL HIGH (ref 70–99)
Glucose-Capillary: 208 mg/dL — ABNORMAL HIGH (ref 70–99)
Glucose-Capillary: 220 mg/dL — ABNORMAL HIGH (ref 70–99)

## 2022-01-08 MED ORDER — METHOCARBAMOL 500 MG PO TABS
500.0000 mg | ORAL_TABLET | Freq: Four times a day (QID) | ORAL | Status: DC | PRN
  Administered 2022-01-08 – 2022-01-15 (×12): 500 mg via ORAL
  Filled 2022-01-08 (×13): qty 1

## 2022-01-08 MED ORDER — HEPARIN SODIUM (PORCINE) 1000 UNIT/ML IJ SOLN
INTRAMUSCULAR | Status: AC
  Filled 2022-01-08: qty 6

## 2022-01-08 MED ORDER — INSULIN GLARGINE-YFGN 100 UNIT/ML ~~LOC~~ SOLN
10.0000 [IU] | Freq: Every day | SUBCUTANEOUS | Status: DC
  Administered 2022-01-08 – 2022-03-17 (×69): 10 [IU] via SUBCUTANEOUS
  Filled 2022-01-08 (×73): qty 0.1

## 2022-01-08 MED ORDER — OXYCODONE HCL 5 MG PO TABS
5.0000 mg | ORAL_TABLET | ORAL | Status: DC | PRN
  Administered 2022-01-08 – 2022-02-22 (×112): 5 mg via ORAL
  Filled 2022-01-08 (×114): qty 1

## 2022-01-08 NOTE — Progress Notes (Addendum)
PROGRESS NOTE    [name]  OZH:[id number] DOB: April 10, 1965 DOA: 08/26/2021 PCP: [name], MD   Brief Narrative:  56 year old female with history of hypertension, hyperlipidemia, diabetes mellitus type 2, end-stage renal disease on hemodialysis, left BKA for chronic calcaneus osteomyelitis on 01/12/2020, bipolar disorder, left distal femoral fracture after a fall on 07/19/2021 deemed a nonoperative, admission from 08/03/2021-08/23/2021 for left femoral osteomyelitis requiring partial resection of femur and rearrangements of bony fragments with subsequent antibiotic treatment presented with left hip bleeding and was found to have subacute osteomyelitis and abscess of left femur.  Orthopedic/ID were consulted.  She was started on broad-spectrum antibiotics.  Underwent multiple I&D's by Dr. [name] along with local tissue rearrangement.  She was also seen by vascular surgery who did not fistula branch ligation.  She has completed 6-week course of daptomycin on 10/09/2021.  She is unable to complete hemodialysis in chair and said outpatient hemodialysis unit has been hard to find.  She is otherwise medically stable for discharge.  Assessment & Plan:   Subacute osteomyelitis of left femur with abscess -Status post surgical intervention by Dr. [name].  She has completed 6-week course of daptomycin on 10/09/2021.  Outpatient follow-up with Dr. [name] 1 week after discharge -Continue current muscle relaxants as needed.  States that she is in significant pain while doing physical therapy: Switch oxycodone to every 4 hours as needed. -Reimaging on 12/02/2021 of left knee showed healing fracture with callus -Seen by Dr. [name] again 10/28: looks to be doing well. No further left hip restriction.   Right thigh mild cellulitis -Controlled.  Completed 5-day course of antibiotics on 12/18/2021.  Symptoms most likely from anasarca.  History of left intertrochanteric fracture -Left hip hardware removed on 08/10/2021 prior to  admission   End-stage renal disease on hemodialysis Anasarca -Nephrology following.  Dialysis as per nephrology schedule.  Still on Lasix.  Strict input and output with daily weights.  Fluid restriction -Has to do hemodialysis in chair prior to discharge to outpatient hemodialysis  Hyponatremia -Management by hemodialysis.  Monitor intermittently.  Hyperphosphatemia--continue sevelamer.  Nephrology following.  Monitor intermittently.  Anemia of chronic disease -Hemoglobin currently stable.  Monitor intermittently  Diabetes mellitus type 2 with hyperglycemia -Continue long-acting insulin, NovoLog with meals along with CBGs with SSI  Hypertension -Monitor blood pressure.  Continue Coreg, hydralazine, Lasix.    History of unspecified CVA Hyperlipidemia -Continue Lipitor  GERD -Continue Protonix and Pepcid  Tobacco use -Tobacco cessation was discussed by prior hospitalist during this hospitalization  Anxiety and depression -Continue amitriptyline and Cymbalta  Goals of care -Prognosis is guarded to poor.  Remains full code.  Palliative care has already evaluated the patient during this hospitalization   DVT prophylaxis: SCDs Code Status: Full Family Communication: None at bedside Disposition Plan: Status is: Inpatient Remains inpatient appropriate because: Difficult placement due to inability to sit in chair for hemodialysis  Consultants: Orthopedics/nephrology/vascular surgery/ID  Procedures: As above  Antimicrobials:  Anti-infectives (From admission, onward)    Start     Dose/Rate Route Frequency Ordered Stop   12/15/21 1730  cefTRIAXone (ROCEPHIN) 2 g in sodium chloride 0.9 % 100 mL IVPB        2 g 200 mL/hr over 30 Minutes Intravenous Every 24 hours 12/15/21 1633 12/18/21 1750   12/14/21 1800  doxycycline (VIBRA-TABS) tablet 100 mg  Status:  Discontinued        100 mg Oral Every 12 hours 12/14/21 1607 12/15/21 1633   10/02/21 2000  DAPTOmycin (  CUBICIN) 900 mg  in sodium chloride 0.9 % IVPB  Status:  Discontinued        10 mg/kg  92.4 kg 136 mL/hr over 30 Minutes Intravenous Once per day on Mon Wed Fri 10/01/21 1350 10/12/21 1146   09/30/21 2000  DAPTOmycin (CUBICIN) 900 mg in sodium chloride 0.9 % IVPB  Status:  Discontinued        10 mg/kg  92.4 kg 136 mL/hr over 30 Minutes Intravenous Daily 09/28/21 0919 10/01/21 1350   09/28/21 2000  DAPTOmycin (CUBICIN) 900 mg in sodium chloride 0.9 % IVPB        900 mg 136 mL/hr over 30 Minutes Intravenous  Once 09/28/21 0919 09/28/21 2146   09/02/21 2000  DAPTOmycin (CUBICIN) 900 mg in sodium chloride 0.9 % IVPB  Status:  Discontinued        10 mg/kg  92.4 kg 136 mL/hr over 30 Minutes Intravenous Once per day on Mon Wed Fri 09/02/21 0836 09/28/21 0919   08/31/21 0745  ceFAZolin (ANCEF) IVPB 2g/100 mL premix        2 g 200 mL/hr over 30 Minutes Intravenous To Short Stay 08/31/21 0730 08/31/21 0918   08/30/21 0600  ceFAZolin (ANCEF) IVPB 2g/100 mL premix  Status:  Discontinued        2 g 200 mL/hr over 30 Minutes Intravenous To Short Stay 08/30/21 0123 08/31/21 0600   08/29/21 2000  DAPTOmycin (CUBICIN) 900 mg in sodium chloride 0.9 % IVPB  Status:  Discontinued        10 mg/kg  92.4 kg 136 mL/hr over 30 Minutes Intravenous Every 48 hours 08/29/21 1627 09/02/21 0836   08/28/21 1800  ceFAZolin (ANCEF) IVPB 2g/100 mL premix  Status:  Discontinued        2 g 200 mL/hr over 30 Minutes Intravenous Every M-W-F (1800) 08/27/21 0846 08/29/21 1620   08/28/21 1200  ceFAZolin (ANCEF) IVPB 1 g/50 mL premix  Status:  Discontinued        1 g 100 mL/hr over 30 Minutes Intravenous Every M-W-F (Hemodialysis) 08/26/21 1954 08/27/21 0846   08/28/21 0600  ceFAZolin (ANCEF) IVPB 2g/100 mL premix        2 g 200 mL/hr over 30 Minutes Intravenous On call to O.R. 08/27/21 1150 08/28/21 0909   08/27/21 1800  ceFAZolin (ANCEF) IVPB 1 g/50 mL premix        1 g 100 mL/hr over 30 Minutes Intravenous  Once 08/26/21 1954 08/27/21  2048   08/26/21 1500  ceFEPIme (MAXIPIME) 1 g in sodium chloride 0.9 % 100 mL IVPB  Status:  Discontinued        1 g 200 mL/hr over 30 Minutes Intravenous Every 24 hours 08/26/21 1452 08/26/21 1714        Subjective: Patient seen and examined at bedside. Poor historian.  No fever, worsening shortness of breath, vomiting reported.  Complains of intermittent pain and is requesting for her pain medications frequency to be increased. Objective: Vitals:   01/07/22 0805 01/07/22 1756 01/07/22 2133 01/08/22 0545  BP: (!) 154/52 (!) 124/94 (!) 143/49 (!) 159/59  Pulse: 64 (!) 59 (!) 56 (!) 59  Resp: 18 16 16 16   Temp: 98.8 F (37.1 C) 98.5 F (36.9 C) 97.9 F (36.6 C) 97.9 F (36.6 C)  TempSrc:  Oral Oral Oral  SpO2: 100% 100% 99% 98%  Weight:      Height:        Intake/Output Summary (Last 24 hours) at 01/08/2022 0750  Last data filed at 01/07/2022 2016 Gross per 24 hour  Intake 540 ml  Output --  Net 540 ml    Filed Weights   01/06/22 0527 01/06/22 0827 01/06/22 1242  Weight: 94.1 kg 97.5 kg 93.7 kg    Examination:  General exam: On room air.  No distress.  Chronically ill and deconditioned.  Still slow to respond.  Poor historian. Respiratory system: Decreased breath sounds at bases bilaterally with some crackles  cardiovascular system: S1-S2 heard; mild intermittent bradycardia present  gastrointestinal system: Abdomen is distended mildly; soft and nontender.  Bowel sounds are heard extremities: Has left BKA with dressing  Data Reviewed: I have personally reviewed following labs and imaging studies  CBC: No results for input(s): "WBC", "NEUTROABS", "HGB", "HCT", "MCV", "PLT" in the last 168 hours.  Basic Metabolic Panel: Recent Labs  Lab 01/01/22 0916  PHOS 5.3*    GFR: Estimated Creatinine Clearance: 12.8 mL/min (A) (by C-G formula based on SCr of 6.23 mg/dL (H)). Liver Function Tests: No results for input(s): "AST", "ALT", "ALKPHOS", "BILITOT", "PROT",  "ALBUMIN" in the last 168 hours.  No results for input(s): "LIPASE", "AMYLASE" in the last 168 hours. No results for input(s): "AMMONIA" in the last 168 hours. Coagulation Profile: No results for input(s): "INR", "PROTIME" in the last 168 hours. Cardiac Enzymes: No results for input(s): "CKTOTAL", "CKMB", "CKMBINDEX", "TROPONINI" in the last 168 hours. BNP (last 3 results) No results for input(s): "PROBNP" in the last 8760 hours. HbA1C: No results for input(s): "HGBA1C" in the last 72 hours. CBG: Recent Labs  Lab 01/06/22 1650 01/07/22 0806 01/07/22 1212 01/07/22 1633 01/07/22 2136  GLUCAP 224* 167* 249* 196* 258*    Lipid Profile: No results for input(s): "CHOL", "HDL", "LDLCALC", "TRIG", "CHOLHDL", "LDLDIRECT" in the last 72 hours. Thyroid Function Tests: No results for input(s): "TSH", "T4TOTAL", "FREET4", "T3FREE", "THYROIDAB" in the last 72 hours. Anemia Panel: No results for input(s): "VITAMINB12", "FOLATE", "FERRITIN", "TIBC", "IRON", "RETICCTPCT" in the last 72 hours. Sepsis Labs: No results for input(s): "PROCALCITON", "LATICACIDVEN" in the last 168 hours.  No results found for this or any previous visit (from the past 240 hour(s)).       Radiology Studies: No results found.      Scheduled Meds:  (feeding supplement) PROSource Plus  30 mL Oral BID BM   alosetron  1 mg Oral BID   amitriptyline  25 mg Oral QHS   atorvastatin  40 mg Oral q1800   carvedilol  3.125 mg Oral BID   Chlorhexidine Gluconate Cloth  6 each Topical Q0600   cyclobenzaprine  7.5 mg Oral TID   darbepoetin (ARANESP) injection - DIALYSIS  100 mcg Subcutaneous Q Fri-1800   docusate sodium  200 mg Oral BID   DULoxetine  60 mg Oral Daily   famotidine  20 mg Oral Daily   fentaNYL  1 patch Transdermal Q72H   furosemide  80 mg Oral Daily   insulin aspart  0-9 Units Subcutaneous TID WC   insulin aspart  4 Units Subcutaneous TID WC   insulin glargine-yfgn  9 Units Subcutaneous QHS    multivitamin  1 tablet Oral QHS   pantoprazole  80 mg Oral Daily   polyethylene glycol  17 g Oral Daily   sevelamer carbonate  2,400 mg Oral TID WC   sodium zirconium cyclosilicate  10 g Oral Once per day on Sun Tue Thu Sat   Continuous Infusions:  albumin human  Aline August, MD [facility] 01/08/2022, 7:50 AM

## 2022-01-08 NOTE — Plan of Care (Signed)
  Problem: Activity: Goal: Risk for activity intolerance will decrease Outcome: Progressing   Problem: Pain Managment: Goal: General experience of comfort will improve Outcome: Progressing   

## 2022-01-08 NOTE — Progress Notes (Signed)
Received patient in bed to unit.  Alert and oriented.  Informed consent signed and in chart.   Treatment initiated: 1025 Treatment completed: 1430  Patient tolerated well.  Transported back to the room  Alert, without acute distress.  Hand-off given to patient's nurse.   Access used: L AVF Access issues: None  Total UF removed: 4082ml Medication(s) given: None Post HD VS: 98.5, 122/56, 60, 12, 98% on RA Post HD weight: 91.8kg   [facility]

## 2022-01-08 NOTE — Progress Notes (Signed)
[facility] Progress Note   Subjective:   Seen in room, awaiting HD. Asks if she can get pain meds more often. Denies SOB, CP, dizziness and nausea.   Objective Vitals:   01/07/22 0805 01/07/22 1756 01/07/22 2133 01/08/22 0545  BP: (!) 154/52 (!) 124/94 (!) 143/49 (!) 159/59  Pulse: 64 (!) 59 (!) 56 (!) 59  Resp: 18 16 16 16   Temp: 98.8 F (37.1 C) 98.5 F (36.9 C) 97.9 F (36.6 C) 97.9 F (36.6 C)  TempSrc:  Oral Oral Oral  SpO2: 100% 100% 99% 98%  Weight:      Height:       Physical Exam General: [name] Heart: RRR, no murmurs, rubs or gallops Lungs: CTA bilaterally, no wheezing, rhonchi or rales Abdomen: Soft, non-distended, +BS Extremities: 1+ edema L hip Dialysis Access:  AVF + bruit    Blood Culture    Component Value Date/Time   SDES TISSUE 08/28/2021 0917   SPECREQUEST LEFT THIGH TISSUE 08/28/2021 0917   CULT  08/28/2021 0917    MODERATE ENTEROCOCCUS FAECIUM VANCOMYCIN RESISTANT ENTEROCOCCUS ISOLATED NO ANAEROBES ISOLATED Sent to [facility] for further susceptibility testing. Performed at [facility], [place] [address], [place], [place] [address]    REPTSTATUS 09/02/2021 FINAL 08/28/2021 7858    Cardiac Enzymes: No results for input(s): "CKTOTAL", "CKMB", "CKMBINDEX", "TROPONINI" in the last 168 hours. CBG: Recent Labs  Lab 01/07/22 0806 01/07/22 1212 01/07/22 1633 01/07/22 2136 01/08/22 0734  GLUCAP 167* 249* 196* 258* 162*   Iron Studies: No results for input(s): "IRON", "TIBC", "TRANSFERRIN", "FERRITIN" in the last 72 hours. @lablastinr3 @ Studies/Results: No results found. Medications:  albumin human      (feeding supplement) PROSource Plus  30 mL Oral BID BM   alosetron  1 mg Oral BID   amitriptyline  25 mg Oral QHS   atorvastatin  40 mg Oral q1800   carvedilol  3.125 mg Oral BID   Chlorhexidine Gluconate Cloth  6 each Topical Q0600   cyclobenzaprine  7.5 mg Oral TID   darbepoetin (ARANESP) injection -  DIALYSIS  100 mcg Subcutaneous Q Fri-1800   docusate sodium  200 mg Oral BID   DULoxetine  60 mg Oral Daily   famotidine  20 mg Oral Daily   fentaNYL  1 patch Transdermal Q72H   furosemide  80 mg Oral Daily   insulin aspart  0-9 Units Subcutaneous TID WC   insulin aspart  4 Units Subcutaneous TID WC   insulin glargine-yfgn  10 Units Subcutaneous QHS   multivitamin  1 tablet Oral QHS   pantoprazole  80 mg Oral Daily   polyethylene glycol  17 g Oral Daily   sevelamer carbonate  2,400 mg Oral TID WC   sodium zirconium cyclosilicate  10 g Oral Once per day on Sun Tue Thu Sat    Outpatient Dialysis Orders: MWF at [facility], 400/500, EDW 90kg, 2K/2Ca, LUE AVF - heparin 4000 unit IV bolus, no ESA  Assessment/Plan: Recurrent L hip osteomyelitis: S/p 08/10/21 surgery (removal hip IMN which was placed 12/2019) with persistent VRE infection. Back to OR 08/28/21, 08/31/21 for L hip debridement /wound vac (now removed). Completed 6 wk course of IV Daptomycin on 10/12/21. Per Dr. Jess Barters note 12/28/2021 "NO RESTRICTIONS L HIP".  Chronic L hip/residual limb pain: She does have fractures of L femoral neck (where hardware was removed) as well as distal femur Fx. Pain control per primary.  ESRD: Continue HD on usual MWF  schedule. Clots HD system without heparin - using 4000 unit bolus and 2000 unit mid-run bolus.  Hyperkalemia: Stable on scheduled lokelma 4 days a week. HTN/volume: BP meds have been tapered down - currently on Coreg 3.125mg  BID only. Ongoing large fluids gains between HD which has been discussed extensively- volume status now improving Anemia of ESRD: Hgb 9s stable. Last tsat 54%. Continue Aranesp 192mcg weekly. Rechecking CBC today.  Secondary HPTH: CorrCa slightly high, not on VDRA. Phos variable, usually 5-6. Continue Renvela as binder. Nutrition: Alb low, continue protein supplements.  Renal diet w/fluid restrictions.  Rechecking labs today.  AVF dysfunction (resolved): S/p  branch ligation 09/19/2021  Dr. Virl Cagey. AVF working well now. DM2: Insulin per primary Dispo: SNF previously recommended, but she declined. Not home HD candidate.  Dialyzed in recliner on 9/4 but signed off early due to pain, has declined to come in a recliner since then.Strongly discussed on the importance of increasing her mobility as much as possible while she is in her room in addition to following her ongoing fluid restriction. Also reinforced the importance of being in the chair for HD treatments to improve strength and mobility. Our goal is to dialyze her in the mornings so the patient has more time to work with PT in the afternoons (we will do our best to accommodate this as we still need to always consider the emergent cases).  Encouraged ongoing progress. Per Renal Navigator notes there are no clinics that will accept patient unless she will have HD in chair. Will continue to encourage use of chair for HD  Anice Paganini, PA-C 01/08/2022, 8:48 AM  [facility] Pager: [phone]

## 2022-01-08 NOTE — Inpatient Diabetes Management (Signed)
Inpatient Diabetes Program Recommendations  AACE/ADA: New Consensus Statement on Inpatient Glycemic Control (2015)  Target Ranges:  Prepandial:   less than 140 mg/dL      Peak postprandial:   less than 180 mg/dL (1-2 hours)      Critically ill patients:  140 - 180 mg/dL   Lab Results  Component Value Date   GLUCAP 162 (H) 01/08/2022   HGBA1C 5.9 (H) 11/14/2021    Review of Glycemic Control  Latest Reference Range & Units 01/07/22 08:06 01/07/22 12:12 01/07/22 16:33 01/07/22 21:36 01/08/22 07:34  Glucose-Capillary 70 - 99 mg/dL 167 (H) 249 (H) 196 (H) 258 (H) 162 (H)   Diabetes history: DM 2 Outpatient Diabetes medications: Novolog 10 units tid with meals, Semglee (ordered never started) Current orders for Inpatient glycemic control:  Semglee 10 units qhs Novolog 0-9 units tid Novolog 4 units tid meal coverage  Inpatient Diabetes Program Recommendations:    Glucose trends increase after PO intake .  -  Consider increasing Novolog meal coverage to 6 units tid if eating >50% of meals.  Thanks,  Tama Headings RN, MSN, BC-ADM Inpatient Diabetes Coordinator Team Pager [phone] (8a-5p)

## 2022-01-09 DIAGNOSIS — M86252 Subacute osteomyelitis, left femur: Secondary | ICD-10-CM | POA: Diagnosis not present

## 2022-01-09 LAB — GLUCOSE, CAPILLARY
Glucose-Capillary: 161 mg/dL — ABNORMAL HIGH (ref 70–99)
Glucose-Capillary: 174 mg/dL — ABNORMAL HIGH (ref 70–99)
Glucose-Capillary: 168 mg/dL — ABNORMAL HIGH (ref 70–99)
Glucose-Capillary: 143 mg/dL — ABNORMAL HIGH (ref 70–99)

## 2022-01-09 LAB — RENAL FUNCTION PANEL
Albumin: 3.4 g/dL — ABNORMAL LOW (ref 3.5–5.0)
Anion gap: 11 (ref 5–15)
BUN: 39 mg/dL — ABNORMAL HIGH (ref 6–20)
CO2: 28 mmol/L (ref 22–32)
Calcium: 8.8 mg/dL — ABNORMAL LOW (ref 8.9–10.3)
Chloride: 89 mmol/L — ABNORMAL LOW (ref 98–111)
Creatinine, Ser: 4.69 mg/dL — ABNORMAL HIGH (ref 0.44–1.00)
GFR, Estimated: 10 mL/min — ABNORMAL LOW (ref >60)
Glucose, Bld: 181 mg/dL — ABNORMAL HIGH (ref 70–99)
Phosphorus: 3.6 mg/dL (ref 2.5–4.6)
Potassium: 4.1 mmol/L (ref 3.5–5.1)
Sodium: 128 mmol/L — ABNORMAL LOW (ref 135–145)

## 2022-01-09 LAB — CBC
HCT: 32.4 % — ABNORMAL LOW (ref 36.0–46.0)
Hemoglobin: 10.6 g/dL — ABNORMAL LOW (ref 12.0–15.0)
MCH: 28.8 pg (ref 26.0–34.0)
MCHC: 32.7 g/dL (ref 30.0–36.0)
MCV: 88 fL (ref 80.0–100.0)
Platelets: 241 10*3/uL (ref 150–400)
RBC: 3.68 MIL/uL — ABNORMAL LOW (ref 3.87–5.11)
RDW: 17.7 % — ABNORMAL HIGH (ref 11.5–15.5)
WBC: 7.5 10*3/uL (ref 4.0–10.5)
nRBC: 0 % (ref 0.0–0.2)

## 2022-01-09 MED ORDER — CHLORHEXIDINE GLUCONATE CLOTH 2 % EX PADS
6.0000 | MEDICATED_PAD | Freq: Every day | CUTANEOUS | Status: DC
  Administered 2022-01-12 – 2022-01-15 (×3): 6 via TOPICAL

## 2022-01-09 NOTE — Inpatient Diabetes Management (Signed)
Inpatient Diabetes Program Recommendations  AACE/ADA: New Consensus Statement on Inpatient Glycemic Control (2015)  Target Ranges:  Prepandial:   less than 140 mg/dL      Peak postprandial:   less than 180 mg/dL (1-2 hours)      Critically ill patients:  140 - 180 mg/dL   Lab Results  Component Value Date   GLUCAP 174 (H) 01/09/2022   HGBA1C 5.9 (H) 11/14/2021    Review of Glycemic Control  Latest Reference Range & Units 01/08/22 07:34 01/08/22 16:43 01/08/22 20:28 01/09/22 07:41 01/09/22 11:15  Glucose-Capillary 70 - 99 mg/dL 162 (H) 208 (H) 220 (H) 161 (H) 174 (H)   Diabetes history: DM 2 Outpatient Diabetes medications: Novolog 10 units tid with meals, Semglee (ordered never started) Current orders for Inpatient glycemic control:  Semglee 10 units qhs Novolog 0-9 units tid Novolog 4 units tid meal coverage  Inpatient Diabetes Program Recommendations:    Glucose trends increase after PO intake .  -  Consider increasing Novolog meal coverage to 6 units tid if eating >50% of meals.  Thanks,  Tama Headings RN, MSN, BC-ADM Inpatient Diabetes Coordinator Team Pager [phone] (8a-5p)

## 2022-01-09 NOTE — TOC Progression Note (Signed)
Transition of Care [facility]) - Initial/Assessment Note    Patient Details  Name: [name] MRN: [id number] Date of Birth: [birth year]  Transition of [facility]) CM/SW Contact:    Milinda Antis, LCSWA Phone Number: 01/09/2022, 4:55 PM  Clinical Narrative:                 LCSW faxed referral to [facility] and is awaiting a response.   Expected Discharge Plan: Skilled Nursing Facility Barriers to Discharge: Continued Medical Work up   Patient Goals and CMS Choice Patient states their goals for this hospitalization and ongoing recovery are:: To return home CMS Medicare.gov Compare Post Acute Care list provided to:: Patient Choice offered to / list presented to : NA  Expected Discharge Plan and Services Expected Discharge Plan: [place]   Discharge Planning Services: CM Consult   Living arrangements for the past 2 months: Single Family Home                                      Prior Living Arrangements/Services Living arrangements for the past 2 months: Single Family Home Lives with:: Adult Children Patient language and need for interpreter reviewed:: Yes Do you feel safe going back to the place where you live?: Yes      Need for Family Participation in Patient Care: Yes (Comment) Care giver support system in place?: Yes (comment) Current home services: DME ([employer], w/c) Criminal Activity/Legal Involvement Pertinent to Current Situation/Hospitalization: No - Comment as needed  Activities of Daily Living Home Assistive Devices/Equipment: Eyeglasses, Wheelchair ADL Screening (condition at time of admission) Patient's cognitive ability adequate to safely complete daily activities?: Yes Is the patient deaf or have difficulty hearing?: No Does the patient have difficulty seeing, even when wearing glasses/contacts?: No Does the patient have difficulty concentrating, remembering, or making decisions?: No Patient able to express need for  assistance with ADLs?: Yes Does the patient have difficulty dressing or bathing?: Yes Independently performs ADLs?: No Communication: Independent Dressing (OT): Independent Is this a change from baseline?: Pre-admission baseline Grooming: Independent Feeding: Independent Bathing: Needs assistance Is this a change from baseline?: Pre-admission baseline Toileting: Needs assistance Is this a change from baseline?: Pre-admission baseline In/Out Bed: Needs assistance, Dependent Is this a change from baseline?: Pre-admission baseline Walks in Home: Dependent Is this a change from baseline?: Pre-admission baseline Does the patient have difficulty walking or climbing stairs?: Yes Weakness of Legs: Both Weakness of Arms/Hands: Both  Permission Sought/Granted Permission sought to share information with : Case Manager, Family Supports Permission granted to share information with : Yes, Verbal Permission Granted              Emotional Assessment Appearance:: Appears stated age Attitude/Demeanor/Rapport: Engaged, Gracious Affect (typically observed): Accepting, Appropriate, Calm, Hopeful Orientation: : Oriented to Self, Oriented to Place, Oriented to  Time, Oriented to Situation Alcohol / Substance Use: Not Applicable Psych Involvement: No (comment)  Admission diagnosis:  Wound infection [T14.8XXA, L08.9] Abscess of left hip [L02.416] Patient Active Problem List   Diagnosis Date Noted   Wound dehiscence, surgical, sequela    Abscess of left hip 08/26/2021   History of CVA (cerebrovascular accident) 11/73/5670   Hardware complicating wound infection ([place])    Subacute osteomyelitis of left femur with abscess     Septic arthritis of hip ([place]) 07/30/2021   Skin abscess L hip 07/28/2021   HCAP (healthcare-associated pneumonia)  07/27/2021   Hyperkalemia 77/82/4235   Metabolic acidemia 36/14/4315   Closed fracture of left distal femur ([place]) 07/22/2021   Type 2 diabetes mellitus with  hyperlipidemia ([place]) 07/22/2021   Infection of superficial incisional surgical site after procedure 03/09/2020   Abscess    Hypoglycemia    Essential hypertension    Sleep disturbance    ESRD (end stage renal disease) on dialysis ([place]) 01/24/2020   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[place]) 01/23/2020   Hyponatremia    Constipation    Chronic osteomyelitis involving left ankle and foot ([place])    Ulcer of left foot with necrosis of bone ([place])    Wound infection 12/28/2019   History of [place] amputation of left foot ([place]) 12/25/2017   History of CVA (cerebrovascular accident) without residual deficits 07/04/2017   Cerebral thrombosis with cerebral infarction 04/08/2017   Right sided weakness 04/07/2017   Hyperhidrosis 09/01/2016   Migraine with aura and without status migrainosus, not intractable 07/28/2016   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[place]) 08/04/2014   CKD stage 3 due to type 2 diabetes mellitus ([place]) 06/27/2014   Vitamin D insufficiency 05/08/2014   Obesity (BMI 30.0-34.9) 05/08/2014   Unilateral amputation of left foot ([place]) 05/08/2014   Bursitis of left shoulder 02/14/2014   Neck pain 01/03/2014   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.23) 01/14/2013   Insomnia 08/04/2012   Diabetic neuropathy, painful ([place]) 08/04/2011   Anxiety and depression 05/16/2010   Female stress incontinence 11/01/2007   TOBACCO DEPENDENCE 04/30/2006   GASTROESOPHAGEAL REFLUX, NO ESOPHAGITIS 04/30/2006   Irritable bowel syndrome 04/30/2006   PCP:  [name], MD Pharmacy:   [facility], [place] [address] [place] [address] Phone: [phone] Fax: [phone]  [id number] - [place], [place] [address] [place] [address] Phone: [phone] Fax: [phone]     Social Determinants of Health ([place])  Interventions    Readmission Risk Interventions    07/26/2021    3:15 PM  Readmission Risk Prevention Plan  Transportation Screening Complete  PCP or Specialist Appt within 3-5 Days Complete  HRI or Home Care Consult Complete  Palliative Care Screening Not Applicable  Medication Review (RN Care Manager) Referral to Pharmacy

## 2022-01-09 NOTE — Progress Notes (Signed)
PROGRESS NOTE    [name]  IWL:[id number] DOB: [birth year] DOA: 08/26/2021 PCP: [name], MD   Brief Narrative:  56 year old female with history of hypertension, hyperlipidemia, diabetes mellitus type 2, end-stage renal disease on hemodialysis, left BKA for chronic calcaneus osteomyelitis on 01/12/2020, bipolar disorder, left distal femoral fracture after a fall on 07/19/2021 deemed a nonoperative, admission from 08/03/2021-08/23/2021 for left femoral osteomyelitis requiring partial resection of femur and rearrangements of bony fragments with subsequent antibiotic treatment presented with left hip bleeding and was found to have subacute osteomyelitis and abscess of left femur.  Orthopedic/ID were consulted.  She was started on broad-spectrum antibiotics.  Underwent multiple I&D's by Dr. [name] along with local tissue rearrangement.  She was also seen by vascular surgery who did not fistula branch ligation.  She has completed 6-week course of daptomycin on 10/09/2021.  She is unable to complete hemodialysis in chair and said outpatient hemodialysis unit has been hard to find.  She is otherwise medically stable for discharge.  Assessment & Plan:   Subacute osteomyelitis of left femur with abscess -Status post surgical intervention by Dr. [name].  She has completed 6-week course of daptomycin on 10/09/2021.  Outpatient follow-up with Dr. [name] 1 week after discharge -Continue current muscle relaxants as needed.  States that she is in significant pain while doing physical therapy: Switch oxycodone to every 4 hours as needed. -Reimaging on 12/02/2021 of left knee showed healing fracture with callus -Seen by Dr. [name] again 10/28: looks to be doing well. No further left hip restriction.   Right thigh mild cellulitis -Controlled.  Completed 5-day course of antibiotics on 12/18/2021.  Symptoms most likely from anasarca.  History of left intertrochanteric fracture -Left hip hardware removed on 08/10/2021 prior to  admission   End-stage renal disease on hemodialysis Anasarca -Nephrology following.  Dialysis as per nephrology schedule.  Still on Lasix.  Strict input and output with daily weights.  Fluid restriction -on intermittent lokelma--labs periodically -Has to do hemodialysis in chair prior to discharge to outpatient hemodialysis, currently sitting ~ 2 hours  Hyponatremia -Management by hemodialysis.  Monitor intermittently.  Hyperphosphatemia--continue sevelamer.  Nephrology following.  Monitor intermittently.  Anemia of chronic disease -Hemoglobin currently stable.    Diabetes mellitus type 2 with hyperglycemia -Continue long-acting insulin, NovoLog with meals along with CBGs with SSI---CBG 160-190  Hypertension -Monitor blood pressure.  Continue Coreg 3.125 bid , hydralazine, Lasix 80 qd   History of unspecified CVA Hyperlipidemia -Continue Lipitor  GERD -Continue Protonix and Pepcid  Tobacco use -Tobacco cessation was discussed by prior hospitalist during this hospitalization  Anxiety and depression -Continue amitriptyline 25 hs and Cymbalta qd  Goals of care -Prognosis is guarded to poor.  Remains full code.  Palliative care has already evaluated the patient during this hospitalization   DVT prophylaxis: SCDs Code Status: Full Family Communication: None at bedside Disposition Plan: Status is: Inpatient Remains inpatient appropriate because: Difficult placement due to inability to sit in chair for hemodialysis  Consultants: Orthopedics/nephrology/vascular surgery/ID  Procedures: As above  Antimicrobials:  Anti-infectives (From admission, onward)    Start     Dose/Rate Route Frequency Ordered Stop   12/15/21 1730  cefTRIAXone (ROCEPHIN) 2 g in sodium chloride 0.9 % 100 mL IVPB        2 g 200 mL/hr over 30 Minutes Intravenous Every 24 hours 12/15/21 1633 12/18/21 1750   12/14/21 1800  doxycycline (VIBRA-TABS) tablet 100 mg  Status:  Discontinued  100 mg  Oral Every 12 hours 12/14/21 1607 12/15/21 1633   10/02/21 2000  DAPTOmycin (CUBICIN) 900 mg in sodium chloride 0.9 % IVPB  Status:  Discontinued        10 mg/kg  92.4 kg 136 mL/hr over 30 Minutes Intravenous Once per day on Mon Wed Fri 10/01/21 1350 10/12/21 1146   09/30/21 2000  DAPTOmycin (CUBICIN) 900 mg in sodium chloride 0.9 % IVPB  Status:  Discontinued        10 mg/kg  92.4 kg 136 mL/hr over 30 Minutes Intravenous Daily 09/28/21 0919 10/01/21 1350   09/28/21 2000  DAPTOmycin (CUBICIN) 900 mg in sodium chloride 0.9 % IVPB        900 mg 136 mL/hr over 30 Minutes Intravenous  Once 09/28/21 0919 09/28/21 2146   09/02/21 2000  DAPTOmycin (CUBICIN) 900 mg in sodium chloride 0.9 % IVPB  Status:  Discontinued        10 mg/kg  92.4 kg 136 mL/hr over 30 Minutes Intravenous Once per day on Mon Wed Fri 09/02/21 0836 09/28/21 0919   08/31/21 0745  ceFAZolin (ANCEF) IVPB 2g/100 mL premix        2 g 200 mL/hr over 30 Minutes Intravenous To Short Stay 08/31/21 0730 08/31/21 0918   08/30/21 0600  ceFAZolin (ANCEF) IVPB 2g/100 mL premix  Status:  Discontinued        2 g 200 mL/hr over 30 Minutes Intravenous To Short Stay 08/30/21 0123 08/31/21 0600   08/29/21 2000  DAPTOmycin (CUBICIN) 900 mg in sodium chloride 0.9 % IVPB  Status:  Discontinued        10 mg/kg  92.4 kg 136 mL/hr over 30 Minutes Intravenous Every 48 hours 08/29/21 1627 09/02/21 0836   08/28/21 1800  ceFAZolin (ANCEF) IVPB 2g/100 mL premix  Status:  Discontinued        2 g 200 mL/hr over 30 Minutes Intravenous Every M-W-F (1800) 08/27/21 0846 08/29/21 1620   08/28/21 1200  ceFAZolin (ANCEF) IVPB 1 g/50 mL premix  Status:  Discontinued        1 g 100 mL/hr over 30 Minutes Intravenous Every M-W-F (Hemodialysis) 08/26/21 1954 08/27/21 0846   08/28/21 0600  ceFAZolin (ANCEF) IVPB 2g/100 mL premix        2 g 200 mL/hr over 30 Minutes Intravenous On call to O.R. 08/27/21 1150 08/28/21 0909   08/27/21 1800  ceFAZolin (ANCEF) IVPB 1  g/50 mL premix        1 g 100 mL/hr over 30 Minutes Intravenous  Once 08/26/21 1954 08/27/21 2048   08/26/21 1500  ceFEPIme (MAXIPIME) 1 g in sodium chloride 0.9 % 100 mL IVPB  Status:  Discontinued        1 g 200 mL/hr over 30 Minutes Intravenous Every 24 hours 08/26/21 1452 08/26/21 1714        Subjective:  Fair sat out some today working with therapy no fever cP  Objective: Vitals:   01/08/22 1430 01/08/22 2028 01/09/22 0626 01/09/22 0841  BP: (!) 122/56 (!) 126/51 (!) 194/65 (!) 174/60  Pulse: 60 61 66 70  Resp: 12 18 18    Temp: 98.5 F (36.9 C) 98.6 F (37 C) 98.3 F (36.8 C) 99.5 F (37.5 C)  TempSrc: Oral Oral Oral Oral  SpO2: 98% 99% 100% 100%  Weight: 91.8 kg     Height:        Intake/Output Summary (Last 24 hours) at 01/09/2022 1613 Last data filed at 01/09/2022 1421  Gross per 24 hour  Intake 1560 ml  Output 400 ml  Net 1160 ml    Filed Weights   01/06/22 1242 01/08/22 1014 01/08/22 1430  Weight: 93.7 kg 95.6 kg 91.8 kg    Examination:  Eomi ncat no focal defciit Cta b no added sound no wheeze no rales no rhonchi Abd soft nt nd no rebound no guard LLE not red--some Le thigh swelling  Data Reviewed: I have personally reviewed following labs and imaging studies  CBC: Recent Labs  Lab 01/09/22 1124  WBC 7.5  HGB 10.6*  HCT 32.4*  MCV 88.0  PLT 235    Basic Metabolic Panel: Recent Labs  Lab 01/09/22 1124  NA 128*  K 4.1  CL 89*  CO2 28  GLUCOSE 181*  BUN 39*  CREATININE 4.69*  CALCIUM 8.8*  PHOS 3.6    GFR: Estimated Creatinine Clearance: 16.8 mL/min (A) (by C-G formula based on SCr of 4.69 mg/dL (H)). Liver Function Tests: Recent Labs  Lab 01/09/22 1124  ALBUMIN 3.4*    No results for input(s): "LIPASE", "AMYLASE" in the last 168 hours. No results for input(s): "AMMONIA" in the last 168 hours. Coagulation Profile: No results for input(s): "INR", "PROTIME" in the last 168 hours. Cardiac Enzymes: No results for input(s):  "CKTOTAL", "CKMB", "CKMBINDEX", "TROPONINI" in the last 168 hours. BNP (last 3 results) No results for input(s): "PROBNP" in the last 8760 hours. HbA1C: No results for input(s): "HGBA1C" in the last 72 hours. CBG: Recent Labs  Lab 01/08/22 0734 01/08/22 1643 01/08/22 2028 01/09/22 0741 01/09/22 1115  GLUCAP 162* 208* 220* 161* 174*    Lipid Profile: No results for input(s): "CHOL", "HDL", "LDLCALC", "TRIG", "CHOLHDL", "LDLDIRECT" in the last 72 hours. Thyroid Function Tests: No results for input(s): "TSH", "T4TOTAL", "FREET4", "T3FREE", "THYROIDAB" in the last 72 hours. Anemia Panel: No results for input(s): "VITAMINB12", "FOLATE", "FERRITIN", "TIBC", "IRON", "RETICCTPCT" in the last 72 hours. Sepsis Labs: No results for input(s): "PROCALCITON", "LATICACIDVEN" in the last 168 hours.  No results found for this or any previous visit (from the past 240 hour(s)).       Radiology Studies: No results found. Scheduled Meds:  (feeding supplement) PROSource Plus  30 mL Oral BID BM   alosetron  1 mg Oral BID   amitriptyline  25 mg Oral QHS   atorvastatin  40 mg Oral q1800   carvedilol  3.125 mg Oral BID   Chlorhexidine Gluconate Cloth  6 each Topical Q0600   darbepoetin (ARANESP) injection - DIALYSIS  100 mcg Subcutaneous Q Fri-1800   docusate sodium  200 mg Oral BID   DULoxetine  60 mg Oral Daily   famotidine  20 mg Oral Daily   fentaNYL  1 patch Transdermal Q72H   furosemide  80 mg Oral Daily   insulin aspart  0-9 Units Subcutaneous TID WC   insulin aspart  4 Units Subcutaneous TID WC   insulin glargine-yfgn  10 Units Subcutaneous QHS   multivitamin  1 tablet Oral QHS   pantoprazole  80 mg Oral Daily   polyethylene glycol  17 g Oral Daily   sevelamer carbonate  2,400 mg Oral TID WC   sodium zirconium cyclosilicate  10 g Oral Once per day on Sun Tue Thu Sat   Continuous Infusions:  albumin human      Nita Sells, MD [facility] 01/09/2022, 4:13 PM

## 2022-01-09 NOTE — Progress Notes (Signed)
[facility] Progress Note   Subjective:   Says she has not slept well the past few nights. No other concerns. Denies SOB, CP, dizziness, abdominal pain and nausea. Labs were not drawn pre-HD as ordered.   Objective Vitals:   01/08/22 1430 01/08/22 2028 01/09/22 0626 01/09/22 0841  BP: (!) 122/56 (!) 126/51 (!) 194/65 (!) 174/60  Pulse: 60 61 66 70  Resp: 12 18 18    Temp: 98.5 F (36.9 C) 98.6 F (37 C) 98.3 F (36.8 C) 99.5 F (37.5 C)  TempSrc: Oral Oral Oral Oral  SpO2: 98% 99% 100% 100%  Weight: 91.8 kg     Height:       Physical Exam General: [name] Heart: RRR, no murmurs, rubs or gallops Lungs: CTA bilaterally, no wheezing, rhonchi or rales Abdomen: Soft, non-distended, +BS Extremities: trace edema L hip Dialysis Access:  AVF + bruit  Additional Objective Labs:  Blood Culture    Component Value Date/Time   SDES TISSUE 08/28/2021 0917   SPECREQUEST LEFT THIGH TISSUE 08/28/2021 0917   CULT  08/28/2021 0917    MODERATE ENTEROCOCCUS FAECIUM VANCOMYCIN RESISTANT ENTEROCOCCUS ISOLATED NO ANAEROBES ISOLATED Sent to [facility] for further susceptibility testing. Performed at [facility], [place] [address] [place], [place] [address]    REPTSTATUS 09/02/2021 FINAL 08/28/2021 2505    Cardiac Enzymes: No results for input(s): "CKTOTAL", "CKMB", "CKMBINDEX", "TROPONINI" in the last 168 hours. CBG: Recent Labs  Lab 01/07/22 2136 01/08/22 0734 01/08/22 1643 01/08/22 2028 01/09/22 0741  GLUCAP 258* 162* 208* 220* 161*   Iron Studies: No results for input(s): "IRON", "TIBC", "TRANSFERRIN", "FERRITIN" in the last 72 hours. @lablastinr3 @ Studies/Results: No results found. Medications:  albumin human      (feeding supplement) PROSource Plus  30 mL Oral BID BM   alosetron  1 mg Oral BID   amitriptyline  25 mg Oral QHS   atorvastatin  40 mg Oral q1800   carvedilol  3.125 mg Oral BID   darbepoetin (ARANESP) injection - DIALYSIS  100  mcg Subcutaneous Q Fri-1800   docusate sodium  200 mg Oral BID   DULoxetine  60 mg Oral Daily   famotidine  20 mg Oral Daily   fentaNYL  1 patch Transdermal Q72H   furosemide  80 mg Oral Daily   insulin aspart  0-9 Units Subcutaneous TID WC   insulin aspart  4 Units Subcutaneous TID WC   insulin glargine-yfgn  10 Units Subcutaneous QHS   multivitamin  1 tablet Oral QHS   pantoprazole  80 mg Oral Daily   polyethylene glycol  17 g Oral Daily   sevelamer carbonate  2,400 mg Oral TID WC   sodium zirconium cyclosilicate  10 g Oral Once per day on Sun Tue Thu Sat    Outpatient Dialysis Orders: MWF at [place], 400/500, EDW 90kg, 2K/2Ca, LUE AVF - heparin 4000 unit IV bolus, no ESA    Assessment/Plan: Recurrent L hip osteomyelitis: S/p 08/10/21 surgery (removal hip IMN which was placed 12/2019) with persistent VRE infection. Back to OR 08/28/21, 08/31/21 for L hip debridement /wound vac (now removed). Completed 6 wk course of IV Daptomycin on 10/12/21. Per Dr. Jess Barters note 12/28/2021 "NO RESTRICTIONS L HIP".  Chronic L hip/residual limb pain: She does have fractures of L femoral neck (where hardware was removed) as well as distal femur Fx. Pain control per primary.  ESRD: Continue HD on usual MWF schedule. Clots HD system without heparin - using  4000 unit bolus and 2000 unit mid-run bolus.  Hyperkalemia: Stable on scheduled lokelma 4 days a week. Labs were not done pre-HD yesterday, will order them today.  HTN/volume: BP meds have been tapered down - currently on Coreg 3.125mg  BID only.  large fluids gains between HD which has been discussed extensively- volume status now improving Anemia of ESRD: Hgb 9s stable. Last tsat 54%. Continue Aranesp 123mcg weekly. Rechecking CBC today.  Secondary HPTH: CorrCa slightly high, not on VDRA. Phos variable, usually 5-6. Continue Renvela as binder. Nutrition: Alb low, continue protein supplements.  Renal diet w/fluid restrictions.  Rechecking labs  today.  AVF dysfunction (resolved): S/p branch ligation 09/19/2021  Dr. Virl Cagey. AVF working well now. DM2: Insulin per primary Dispo: SNF previously recommended, but she declined. Not home HD candidate.  Dialyzed in recliner on 9/4 but signed off early due to pain, has declined to come in a recliner since then.Strongly discussed on the importance of increasing her mobility as much as possible while she is in her room in addition to following her ongoing fluid restriction. Also reinforced the importance of being in the chair for HD treatments to improve strength and mobility. Our goal is to dialyze her in the mornings so the patient has more time to work with PT in the afternoons (we will do our best to accommodate this as we still need to always consider the emergent cases).  Encouraged ongoing progress. Per Renal Navigator notes there are no clinics that will accept patient unless she will have HD in chair. Will continue to encourage use of chair for HD  Anice Paganini, PA-C 01/09/2022, 9:19 AM  [facility] Pager: [phone]

## 2022-01-09 NOTE — Progress Notes (Signed)
Physical Therapy Treatment Patient Details Name: [name] MRN: [id number] DOB: 1965/12/21 Today's Date: 01/09/2022   History of Present Illness 56 y.o. female presented to ED 6/26 from dialysis with increased bloody drainage from L hip wound. 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 s/p hardware removal of left hip with partial resection of tuft tissue and femure that were nonviable on 08/10/21 Discharged home 6/23. underwent L hip debridement and wound vac placement 6/28; +Left intertrochanteric non-union,  Fracture of left distal femur  PMH: hypertension, hyperlipidemia, ESRD on HD MWF, history of left BKA in 2021, depression/anxiety, stroke, tobacco use, T2DM,  insomnia, chronic pain syndrome,    PT Comments    Patient declined any attempts at OOB due to nausea and fatigue despite encouragement. Patient agreeable to knee extension and hip IR stretches in long sitting and R sidelying. Encouraged patient to continue performing stretches to assist with therapy when present. Daughter brought w/c but patient declined getting into w/c. Positioned L LE in more neutral position with bolster at lateral thigh and distal medial residual limb. Continue to recommend SNF for ongoing Physical Therapy.       Recommendations for follow up therapy are one component of a multi-disciplinary discharge planning process, led by the attending physician.  Recommendations may be updated based on patient status, additional functional criteria and insurance authorization.  Follow Up Recommendations  Skilled nursing-short term rehab (<3 hours/day) (pt currently refusing due to inability to tolerate sitting in chair for 6+ hours as transported to/from dialysis) Can patient physically be transported by private vehicle: No   Assistance Recommended at Discharge Intermittent Supervision/Assistance  Patient can return home with the following A lot of help with  bathing/dressing/bathroom;Assistance with cooking/housework;Assist for transportation;Two people to help with walking and/or transfers;Help with stairs or ramp for entrance   Equipment Recommendations  None recommended by PT    Recommendations for Other Services       Precautions / Restrictions Precautions Precautions: Fall;Other (comment) Precaution Comments: L BKA (baseline) Restrictions Weight Bearing Restrictions: No LLE Weight Bearing: Weight bearing as tolerated Other Position/Activity Restrictions: L distal femur fx; per Dr [name] 8/10 pt can full WB     Mobility  Bed Mobility Overal bed mobility: Needs Assistance Bed Mobility: Rolling Rolling: Min assist         General bed mobility comments: assist to roll for strertching    Transfers                   General transfer comment: declined OOB mobility despite encouragement due to nausea and lack of sleep    Ambulation/Gait                   Stairs             Wheelchair Mobility    Modified Rankin (Stroke Patients Only)       Balance                                            Cognition Arousal/Alertness: Awake/alert Behavior During Therapy: WFL for tasks assessed/performed Overall Cognitive Status: Within Functional Limits for tasks assessed  Exercises Other Exercises Other Exercises: sidelying hip extension and IR x 5 for 30 seconds Other Exercises: long sitting knee extension and hip IR stretch x 5 each for 30 seconds    General Comments        Pertinent Vitals/Pain Pain Assessment Pain Assessment: Faces Faces Pain Scale: Hurts even more Pain Location: L hip/LLE with stretching into internal rotation towards neutral and knee extension stretching Pain Descriptors / Indicators: Discomfort, Grimacing, Guarding Pain Intervention(s): Monitored during session    Home Living                           Prior Function            PT Goals (current goals can now be found in the care plan section) Acute Rehab PT Goals PT Goal Formulation: With patient Time For Goal Achievement: 01/23/22 Potential to Achieve Goals: Fair Additional Goals Additional Goal #1: Patient will improve left knee extension to -15 degrees and hip internal rotation -30 degrees in preparation for use of Left BKA prosthesis for functional mobility. Progress towards PT goals: Progressing toward goals    Frequency    Min 2X/week      PT Plan Current plan remains appropriate    Co-evaluation              AM-PAC PT "6 Clicks" Mobility   Outcome Measure  Help needed turning from your back to your side while in a flat bed without using bedrails?: None Help needed moving from lying on your back to sitting on the side of a flat bed without using bedrails?: None Help needed moving to and from a bed to a chair (including a wheelchair)?: A Little Help needed standing up from a chair using your arms (e.g., wheelchair or bedside chair)?: Total Help needed to walk in hospital room?: Total Help needed climbing 3-5 steps with a railing? : Total 6 Click Score: 14    End of Session   Activity Tolerance: Patient tolerated treatment well Patient left: in bed;with call bell/phone within reach Nurse Communication: Mobility status PT Visit Diagnosis: Other abnormalities of gait and mobility (R26.89);Muscle weakness (generalized) (M62.81);History of falling (Z91.81);Pain Pain - Right/Left: Left Pain - part of body: Leg     Time: 7001-7494 PT Time Calculation (min) (ACUTE ONLY): 27 min  Charges:  $Therapeutic Exercise: 23-37 mins                     Noam Franzen A. Gilford Rile PT, DPT Acute Rehabilitation Services Office [phone]    [name] 01/09/2022, 4:57 PM

## 2022-01-09 NOTE — NC FL2 (Signed)
[place] LEVEL OF CARE SCREENING TOOL     IDENTIFICATION  Patient Name: [name] Birthdate: 28-Sep-1965 Sex: female Admission Date (Current Location): 08/26/2021  [place] and [place] Number:  [name] [id number] [place] and Address:  [facility] [facility]. [facility], [place] [address] [place], [place] [address]      Provider Number: [id number]  Attending Physician Name and Address:  Nita Sells, MD  Relative Name and Phone Number:  Milania, [place] (Daughter) [phone]    Current Level of Care: Hospital Recommended Level of Care: [place] Prior Approval Number:    Date Approved/Denied:   PASRR Number: [id number]  Discharge Plan: SNF    Current Diagnoses: Patient Active Problem List   Diagnosis Date Noted   Wound dehiscence, surgical, sequela    Abscess of left hip 08/26/2021   History of CVA (cerebrovascular accident) 67/02/4579   Hardware complicating wound infection ([place])    Subacute osteomyelitis of left femur with abscess     Septic arthritis of hip ([place]) 07/30/2021   Skin abscess L hip 07/28/2021   HCAP (healthcare-associated pneumonia) 07/27/2021   Hyperkalemia 99/83/3825   Metabolic acidemia 05/39/7673   Closed fracture of left distal femur ([place]) 07/22/2021   Type 2 diabetes mellitus with hyperlipidemia ([place]) 07/22/2021   Infection of superficial incisional surgical site after procedure 03/09/2020   Abscess    Hypoglycemia    Essential hypertension    Sleep disturbance    ESRD (end stage renal disease) on dialysis ([place]) 01/24/2020   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[place]) 01/23/2020   Hyponatremia    Constipation    Chronic osteomyelitis involving left ankle and foot ([place])    Ulcer of left foot with necrosis of bone ([place])    Wound infection 12/28/2019   History of Chopart amputation of left foot ([place]) 12/25/2017   History of CVA (cerebrovascular accident) without residual deficits  07/04/2017   Cerebral thrombosis with cerebral infarction 04/08/2017   Right sided weakness 04/07/2017   Hyperhidrosis 09/01/2016   Migraine with aura and without status migrainosus, not intractable 07/28/2016   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[place]) 08/04/2014   CKD stage 3 due to type 2 diabetes mellitus ([place]) 06/27/2014   Vitamin D insufficiency 05/08/2014   Obesity (BMI 30.0-34.9) 05/08/2014   Unilateral amputation of left foot ([place]) 05/08/2014   Bursitis of left shoulder 02/14/2014   Neck pain 01/03/2014   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.23) 01/14/2013   Insomnia 08/04/2012   Diabetic neuropathy, painful ([place]) 08/04/2011   Anxiety and depression 05/16/2010   Female stress incontinence 11/01/2007   TOBACCO DEPENDENCE 04/30/2006   GASTROESOPHAGEAL REFLUX, NO ESOPHAGITIS 04/30/2006   Irritable bowel syndrome 04/30/2006    Orientation RESPIRATION BLADDER Height & Weight     Self, Time, Situation, Place  Normal Continent Weight: 202 lb 6.1 oz (91.8 kg) Height:  5\' 10"  (177.8 cm)  BEHAVIORAL SYMPTOMS/MOOD NEUROLOGICAL BOWEL NUTRITION STATUS      Continent Diet (see d/c summary)  AMBULATORY STATUS COMMUNICATION OF NEEDS Skin   Total Care Verbally Surgical wounds                       Personal Care Assistance Level of Assistance    Bathing Assistance: Limited assistance Feeding assistance: Independent Dressing Assistance: Maximum assistance     Functional Limitations Info  Sight, Hearing, Speech Sight Info: Adequate Hearing Info: Adequate Speech Info: Adequate    SPECIAL CARE FACTORS  FREQUENCY  PT (By licensed PT), OT (By licensed OT)     PT Frequency: 5x/ week OT Frequency: 5x/ week            Contractures Contractures Info: Not present    Additional Factors Info  Code Status, Allergies Code Status Info: Full Allergies Info: Trazodone  Gabapentin  Lyrica (Pregabalin)  Latex  Lidocaine   Insulin  Sliding Scale Info: see d/c medication list       Current Medications (01/09/2022):  This is the current hospital active medication list Current Facility-Administered Medications  Medication Dose Route Frequency Provider Last Rate Last Admin   (feeding supplement) PROSource Plus liquid 30 mL  30 mL Oral BID BM Loren Racer, PA-C   30 mL at 01/09/22 1431   acetaminophen (TYLENOL) tablet 325-650 mg  325-650 mg Oral Q6H PRN Newt Minion, MD   650 mg at 12/09/21 1212   albumin human 25 % solution 12.5 g  12.5 g Intravenous PRN Penninger, Ria Comment, PA       alosetron (LOTRONEX) tablet 1 mg  1 mg Oral BID Newt Minion, MD   1 mg at 01/09/22 0950   amitriptyline (ELAVIL) tablet 25 mg  25 mg Oral QHS Newt Minion, MD   25 mg at 01/08/22 2121   atorvastatin (LIPITOR) tablet 40 mg  40 mg Oral q1800 Manuella Ghazi, Pratik D, DO   40 mg at 01/08/22 1807   bisacodyl (DULCOLAX) suppository 10 mg  10 mg Rectal Daily PRN Newt Minion, MD       calcium carbonate (TUMS - dosed in mg elemental calcium) chewable tablet 200 mg of elemental calcium  1 tablet Oral Q2H PRN Vernelle Emerald, MD   200 mg of elemental calcium at 12/05/21 2114   camphor-menthol (SARNA) lotion   Topical PRN Donne Hazel, MD       carvedilol (COREG) tablet 3.125 mg  3.125 mg Oral BID Penninger, Lindsay, PA   3.125 mg at 01/09/22 0950   Chlorhexidine Gluconate Cloth 2 % PADS 6 each  6 each Topical Q0600 Janalee Dane, PA-C       Darbepoetin Alfa (ARANESP) injection 100 mcg  100 mcg Subcutaneous Q Fri-1800 Valentina Gu, NP   100 mcg at 01/03/22 1731   docusate sodium (COLACE) capsule 200 mg  200 mg Oral BID Lavina Hamman, MD   200 mg at 01/07/22 2212   DULoxetine (CYMBALTA) DR capsule 60 mg  60 mg Oral Daily Newt Minion, MD   60 mg at 01/09/22 0950   famotidine (PEPCID) tablet 20 mg  20 mg Oral Daily Samuella Cota, MD   20 mg at 01/09/22 0950   fentaNYL (DURAGESIC) 12 MCG/HR 1 patch  1 patch Transdermal Q72H  Nita Sells, MD   1 patch at 01/07/22 1721   furosemide (LASIX) tablet 80 mg  80 mg Oral Daily Reesa Chew, MD   80 mg at 01/09/22 0950   hydrALAZINE (APRESOLINE) injection 10 mg  10 mg Intravenous Q4H PRN Amin, Ankit Chirag, MD       hydrOXYzine (ATARAX) tablet 25 mg  25 mg Oral BID PRN Penninger, Lindsay, PA   25 mg at 01/08/22 2121   insulin aspart (novoLOG) injection 0-9 Units  0-9 Units Subcutaneous TID WC Wendee Beavers T, MD   2 Units at 01/09/22 1230   insulin aspart (novoLOG) injection 4 Units  4 Units Subcutaneous TID WC Mercy Riding, MD   4  Units at 01/09/22 1231   insulin glargine-yfgn (SEMGLEE) injection 10 Units  10 Units Subcutaneous QHS Alekh, Kshitiz, MD   10 Units at 01/08/22 2122   ipratropium-albuterol (DUONEB) 0.5-2.5 (3) MG/3ML nebulizer solution 3 mL  3 mL Nebulization Q4H PRN Amin, Ankit Chirag, MD       methocarbamol (ROBAXIN) tablet 500 mg  500 mg Oral Q6H PRN Aline August, MD   500 mg at 01/08/22 2121   metoprolol tartrate (LOPRESSOR) injection 5 mg  5 mg Intravenous Q4H PRN Amin, Ankit Chirag, MD       multivitamin (RENA-VIT) tablet 1 tablet  1 tablet Oral QHS Newt Minion, MD   1 tablet at 01/08/22 2122   ondansetron (ZOFRAN) tablet 4 mg  4 mg Oral Q4H PRN Rai, Ripudeep K, MD   4 mg at 01/09/22 3291   Oral care mouth rinse  15 mL Mouth Rinse PRN Dessa Phi, DO       oxyCODONE (Oxy IR/ROXICODONE) immediate release tablet 5 mg  5 mg Oral Q4H PRN Aline August, MD   5 mg at 01/09/22 1110   pantoprazole (PROTONIX) EC tablet 80 mg  80 mg Oral Daily Newt Minion, MD   80 mg at 01/09/22 0950   polyethylene glycol (MIRALAX / GLYCOLAX) packet 17 g  17 g Oral Daily Lavina Hamman, MD   17 g at 11/13/21 1233   prochlorperazine (COMPAZINE) injection 5 mg  5 mg Intravenous Q6H PRN Nita Sells, MD   5 mg at 01/09/22 1109   prochlorperazine (COMPAZINE) tablet 5 mg  5 mg Oral Q6H PRN Nita Sells, MD   5 mg at 12/16/21 1304   senna-docusate  (Senokot-S) tablet 1 tablet  1 tablet Oral QHS PRN Amin, Ankit Chirag, MD       sevelamer carbonate (RENVELA) tablet 2,400 mg  2,400 mg Oral TID WC Collins, Samantha G, PA-C   2,400 mg at 01/09/22 1230   sodium zirconium cyclosilicate (LOKELMA) packet 10 g  10 g Oral Once per day on Sun Tue Thu Sat Nita Sells, MD   10 g at 01/09/22 0845     Discharge Medications: Please see discharge summary for a list of discharge medications.  Relevant Imaging Results:  Relevant Lab Results:   Additional Information SS#[id number]. Pt is not vaccinated for covid.  HD patient at [facility] on MWF. Pt arrives at 11:55 for 12:15 chair time.  Needs Stretcher dialysis due to inability to sit  [employer], LCSWA

## 2022-01-10 LAB — GLUCOSE, CAPILLARY
Glucose-Capillary: 125 mg/dL — ABNORMAL HIGH (ref 70–99)
Glucose-Capillary: 120 mg/dL — ABNORMAL HIGH (ref 70–99)
Glucose-Capillary: 235 mg/dL — ABNORMAL HIGH (ref 70–99)
Glucose-Capillary: 116 mg/dL — ABNORMAL HIGH (ref 70–99)

## 2022-01-10 LAB — CBC
HCT: 31.5 % — ABNORMAL LOW (ref 36.0–46.0)
Hemoglobin: 10.4 g/dL — ABNORMAL LOW (ref 12.0–15.0)
MCH: 28.9 pg (ref 26.0–34.0)
MCHC: 33 g/dL (ref 30.0–36.0)
MCV: 87.5 fL (ref 80.0–100.0)
Platelets: 247 10*3/uL (ref 150–400)
RBC: 3.6 MIL/uL — ABNORMAL LOW (ref 3.87–5.11)
RDW: 17.6 % — ABNORMAL HIGH (ref 11.5–15.5)
WBC: 7.2 10*3/uL (ref 4.0–10.5)
nRBC: 0 % (ref 0.0–0.2)

## 2022-01-10 LAB — RENAL FUNCTION PANEL
Albumin: 3.4 g/dL — ABNORMAL LOW (ref 3.5–5.0)
Anion gap: 13 (ref 5–15)
BUN: 52 mg/dL — ABNORMAL HIGH (ref 6–20)
CO2: 25 mmol/L (ref 22–32)
Calcium: 9.1 mg/dL (ref 8.9–10.3)
Chloride: 87 mmol/L — ABNORMAL LOW (ref 98–111)
Creatinine, Ser: 5.29 mg/dL — ABNORMAL HIGH (ref 0.44–1.00)
GFR, Estimated: 9 mL/min — ABNORMAL LOW (ref >60)
Glucose, Bld: 132 mg/dL — ABNORMAL HIGH (ref 70–99)
Phosphorus: 5 mg/dL — ABNORMAL HIGH (ref 2.5–4.6)
Potassium: 4.7 mmol/L (ref 3.5–5.1)
Sodium: 125 mmol/L — ABNORMAL LOW (ref 135–145)

## 2022-01-10 MED ORDER — HEPARIN SODIUM (PORCINE) 1000 UNIT/ML IJ SOLN
INTRAMUSCULAR | Status: AC
  Administered 2022-01-10: 4000 [IU]
  Filled 2022-01-10: qty 4

## 2022-01-10 NOTE — Progress Notes (Signed)
Seen on HD unit No new issues--still laying flat.  Working her way to 4 hours  Her biggest concern is that she has to be on the unit "sometimes 5-6 hours "   We discussed revisiting mobility and sitting up at a different time  No charge as no changes today to overall plan  Verneita Griffes, MD [facility] 3:48 PM

## 2022-01-10 NOTE — Progress Notes (Addendum)
Subjective: Seen on HD, "trying to get out of here before Thanksgiving , I  think I am moving better."  Though not in recliner HD.  Objective Vital signs in last 24 hours: Vitals:   01/10/22 0725 01/10/22 0800 01/10/22 0830 01/10/22 0900  BP: (!) 166/59 (!) 162/52 (!) 164/58 (!) 172/57  Pulse: (!) 58 (!) 59 (!) 58   Resp: (!) 23 20 17    Temp:      TempSrc:      SpO2: 100% 100% 99%   Weight:      Height:       Weight change:   Physical Exam: General: Alert chronically ill female NAD Heart: RRR no MRG Lungs: CTA nonlabored breathing Abdomen: NABS, soft NTND Extremities: Trace pedal edema left hip Dialysis Access: AV fistula patent on HD   Outpatient Dialysis Orders: MWF at [facility], 400/500, EDW 90kg, 2K/2Ca, LUE AVF - heparin 4000 unit IV bolus, no ESA      Problem/Plan Recurrent L hip osteomyelitis: S/p 08/10/21 surgery (removal hip IMN which was placed 12/2019) with persistent VRE infection. Back to OR 08/28/21, 08/31/21 for L hip debridement /wound vac (now removed). Completed 6 wk course of IV Daptomycin on 10/12/21. Per Dr. Jess Barters note 12/28/2021 "NO RESTRICTIONS L HIP".  Chronic L hip/residual limb pain: She does have fractures of L femoral neck (where hardware was removed) as well as distal femur Fx. Pain control per primary.  ESRD: Continue HD on usual MWF schedule. Clots HD system without heparin - using 4000 unit bolus and 2000 unit mid-run bolus.  Hyperkalemia: Pre-HD K4.7 01/10/2022 stable on scheduled lokelma 4 days a week. HTN/volume: BP meds have been tapered down - currently on Coreg 3.125mg  BID only.  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 Last tsat 54%. Continue Aranesp 132mcg weekly.  Secondary HPTH: CorrCa slightly high, not on VDRA. Phos variable, usually 5-6. Continue Renvela as binder. Nutrition: Alb low 3.4, continue protein supplements.  Renal  diet w/fluid restrictions.    AVF dysfunction (resolved): S/p branch ligation 09/19/2021  Dr. Virl Cagey. AVF working well now. DM2: Insulin per primary Dispo: SNF previously recommended, but she declined. Not home HD candidate.  Dialyzed in recliner on 9/4 but signed off early due to pain, has declined to come in a recliner since then.Strongly discussed on the importance of increasing her mobility as much as possible while she is in her room in addition to following her ongoing fluid restriction. Also reinforced the importance of being in the chair for HD treatments to improve strength and mobility. Our goal is to dialyze her in the mornings so the patient has more time to work with PT in the afternoons (we will do our best to accommodate this as we still need to always consider the emergent cases).  Encouraged ongoing progress. Per Renal Navigator notes there are no clinics that will accept patient unless she will have HD in chair. Will continue to encourage use of chair for HD   Ernest Haber, PA-C [facility] [phone] 01/10/2022,9:22 AM  LOS: 137 days   Labs: Basic Metabolic Panel: Recent Labs  Lab 01/09/22 1124 01/10/22 0724  NA 128* 125*  K 4.1 4.7  CL 89* 87*  CO2 28 25  GLUCOSE 181* 132*  BUN 39* 52*  CREATININE 4.69* 5.29*  CALCIUM 8.8* 9.1  PHOS 3.6 5.0*   Liver Function Tests: Recent Labs  Lab 01/09/22 1124 01/10/22 0724  ALBUMIN 3.4* 3.4*   No results for input(s): "LIPASE", "AMYLASE" in the last 168 hours. No results for input(s): "AMMONIA" in the last 168 hours. CBC: Recent Labs  Lab 01/09/22 1124 01/10/22 0724  WBC 7.5 7.2  HGB 10.6* 10.4*  HCT 32.4* 31.5*  MCV 88.0 87.5  PLT 241 247   Cardiac Enzymes: No results for input(s): "CKTOTAL", "CKMB", "CKMBINDEX", "TROPONINI" in the last 168 hours. CBG: Recent Labs  Lab 01/09/22 0741 01/09/22 1115 01/09/22 1641 01/09/22 2119 01/10/22 0619  GLUCAP 161* 174* 168* 143* 125*     Studies/Results: No results found. Medications:  albumin human      (feeding supplement) PROSource Plus  30 mL Oral BID BM   alosetron  1 mg Oral BID   amitriptyline  25 mg Oral QHS   atorvastatin  40 mg Oral q1800   carvedilol  3.125 mg Oral BID   Chlorhexidine Gluconate Cloth  6 each Topical Q0600   darbepoetin (ARANESP) injection - DIALYSIS  100 mcg Subcutaneous Q Fri-1800   docusate sodium  200 mg Oral BID   DULoxetine  60 mg Oral Daily   famotidine  20 mg Oral Daily   fentaNYL  1 patch Transdermal Q72H   furosemide  80 mg Oral Daily   insulin aspart  0-9 Units Subcutaneous TID WC   insulin aspart  4 Units Subcutaneous TID WC   insulin glargine-yfgn  10 Units Subcutaneous QHS   multivitamin  1 tablet Oral QHS   pantoprazole  80 mg Oral Daily   polyethylene glycol  17 g Oral Daily   sevelamer carbonate  2,400 mg Oral TID WC   sodium zirconium cyclosilicate  10 g Oral Once per day on Sun Tue Thu Sat

## 2022-01-10 NOTE — TOC Progression Note (Signed)
Transition of Care [facility]) - Initial/Assessment Note    Patient Details  Name: [name] MRN: [id number] Date of Birth: 11/24/1965  Transition of [facility] CM/SW Contact:    Milinda Antis, LCSWA Phone Number: 01/10/2022, 4:44 PM  Clinical Narrative:                 LCSW was contacted by [facility].  The facility requested more information.  Information sent.  LCSW is awaiting determination.   Expected Discharge Plan: Skilled Nursing Facility Barriers to Discharge: Continued Medical Work up   Patient Goals and CMS Choice Patient states their goals for this hospitalization and ongoing recovery are:: To return home CMS Medicare.gov Compare Post Acute Care list provided to:: Patient Choice offered to / list presented to : NA  Expected Discharge Plan and Services Expected Discharge Plan: [place]   Discharge Planning Services: CM Consult   Living arrangements for the past 2 months: Single Family Home                                      Prior Living Arrangements/Services Living arrangements for the past 2 months: Single Family Home Lives with:: Adult Children Patient language and need for interpreter reviewed:: Yes Do you feel safe going back to the place where you live?: Yes      Need for Family Participation in Patient Care: Yes (Comment) Care giver support system in place?: Yes (comment) Current home services: DME ([employer], w/c) Criminal Activity/Legal Involvement Pertinent to Current Situation/Hospitalization: No - Comment as needed  Activities of Daily Living Home Assistive Devices/Equipment: Eyeglasses, Wheelchair ADL Screening (condition at time of admission) Patient's cognitive ability adequate to safely complete daily activities?: Yes Is the patient deaf or have difficulty hearing?: No Does the patient have difficulty seeing, even when wearing glasses/contacts?: No Does the patient have difficulty concentrating,  remembering, or making decisions?: No Patient able to express need for assistance with ADLs?: Yes Does the patient have difficulty dressing or bathing?: Yes Independently performs ADLs?: No Communication: Independent Dressing (OT): Independent Is this a change from baseline?: Pre-admission baseline Grooming: Independent Feeding: Independent Bathing: Needs assistance Is this a change from baseline?: Pre-admission baseline Toileting: Needs assistance Is this a change from baseline?: Pre-admission baseline In/Out Bed: Needs assistance, Dependent Is this a change from baseline?: Pre-admission baseline Walks in Home: Dependent Is this a change from baseline?: Pre-admission baseline Does the patient have difficulty walking or climbing stairs?: Yes Weakness of Legs: Both Weakness of Arms/Hands: Both  Permission Sought/Granted Permission sought to share information with : Case Manager, Family Supports Permission granted to share information with : Yes, Verbal Permission Granted              Emotional Assessment Appearance:: Appears stated age Attitude/Demeanor/Rapport: Engaged, Gracious Affect (typically observed): Accepting, Appropriate, Calm, Hopeful Orientation: : Oriented to Self, Oriented to Place, Oriented to  Time, Oriented to Situation Alcohol / Substance Use: Not Applicable Psych Involvement: No (comment)  Admission diagnosis:  Wound infection [T14.8XXA, L08.9] Abscess of left hip [L02.416] Patient Active Problem List   Diagnosis Date Noted   Wound dehiscence, surgical, sequela    Abscess of left hip 08/26/2021   History of CVA (cerebrovascular accident) 93/26/7124   Hardware complicating wound infection ([place])    Subacute osteomyelitis of left femur with abscess     Septic arthritis of hip ([place]) 07/30/2021   Skin  abscess L hip 07/28/2021   HCAP (healthcare-associated pneumonia) 07/27/2021   Hyperkalemia 85/46/2703   Metabolic acidemia 50/11/3816   Closed fracture  of left distal femur ([place]) 07/22/2021   Type 2 diabetes mellitus with hyperlipidemia ([place]) 07/22/2021   Infection of superficial incisional surgical site after procedure 03/09/2020   Abscess    Hypoglycemia    Essential hypertension    Sleep disturbance    ESRD (end stage renal disease) on dialysis ([place]) 01/24/2020   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[place]) 01/23/2020   Hyponatremia    Constipation    Chronic osteomyelitis involving left ankle and foot ([place])    Ulcer of left foot with necrosis of bone ([place])    Wound infection 12/28/2019   History of Chopart amputation of left foot ([place]) 12/25/2017   History of CVA (cerebrovascular accident) without residual deficits 07/04/2017   Cerebral thrombosis with cerebral infarction 04/08/2017   Right sided weakness 04/07/2017   Hyperhidrosis 09/01/2016   Migraine with aura and without status migrainosus, not intractable 07/28/2016   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[place]) 08/04/2014   CKD stage 3 due to type 2 diabetes mellitus ([place]) 06/27/2014   Vitamin D insufficiency 05/08/2014   Obesity (BMI 30.0-34.9) 05/08/2014   Unilateral amputation of left foot ([place]) 05/08/2014   Bursitis of left shoulder 02/14/2014   Neck pain 01/03/2014   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.23) 01/14/2013   Insomnia 08/04/2012   Diabetic neuropathy, painful ([place]) 08/04/2011   Anxiety and depression 05/16/2010   Female stress incontinence 11/01/2007   TOBACCO DEPENDENCE 04/30/2006   GASTROESOPHAGEAL REFLUX, NO ESOPHAGITIS 04/30/2006   Irritable bowel syndrome 04/30/2006   PCP:  [name], MD Pharmacy:   [facility] [place] [address] [place] [address] Phone: [phone] Fax: [phone]  [id number] [place], [place] [address] [place] [address] Phone:  [phone] Fax: [phone]     Social Determinants of Health (SDOH) Interventions    Readmission Risk Interventions    07/26/2021    3:15 PM  Readmission Risk Prevention Plan  Transportation Screening Complete  PCP or Specialist Appt within 3-5 Days Complete  HRI or [place] Complete  Palliative Care Screening Not Applicable  Medication Review (RN Care Manager) Referral to Pharmacy

## 2022-01-10 NOTE — Progress Notes (Signed)
Received patient in bed to unit.  Alert and oriented.  Informed consent signed and in chart.   Treatment initiated: 0725 Treatment completed: 1127  Patient tolerated well.  Transported back to the room  Alert, without acute distress.  Hand-off given to patient's nurse.   Access used: AVF  Access issues: none  Total UF removed: 2.8L Medication(s) given: None Post HD VS: 159/57,63,15,99%,98.0 Post HD weight: 92.7kg   [facility]

## 2022-01-11 DIAGNOSIS — M86252 Subacute osteomyelitis, left femur: Secondary | ICD-10-CM | POA: Diagnosis not present

## 2022-01-11 MED ORDER — HYDRALAZINE HCL 50 MG PO TABS
50.0000 mg | ORAL_TABLET | Freq: Three times a day (TID) | ORAL | Status: DC
  Administered 2022-01-11 – 2022-03-18 (×170): 50 mg via ORAL
  Filled 2022-01-11 (×172): qty 1

## 2022-01-11 NOTE — Progress Notes (Signed)
   01/11/22 0940  Assess: MEWS Score  BP (!) 187/146  Pulse Rate 67  Assess: MEWS Score  MEWS Temp 0  MEWS Systolic 0  MEWS Pulse 0  MEWS RR 0  MEWS LOC 0  MEWS Score 0  MEWS Score Color Green  Assess: SIRS CRITERIA  SIRS Temperature  0  SIRS Pulse 0  SIRS Respirations  0  SIRS WBC 1  SIRS Score Sum  1   Per NT patient had yellow MEWS. Assessed patient and rechecked v/s. Patient has green MEWS score but BP was still elevated. Gave PRN hydralazine and notified MD that BP's have been elevated. Received orders for scheduled hydralazine. Patient's BP improved after hydralazine was administered

## 2022-01-11 NOTE — Progress Notes (Signed)
PROGRESS NOTE    [name]  DQQ:[id number] DOB: [birth year] DOA: 08/26/2021 PCP: [name], MD   Brief Narrative:  56 year old female with history of hypertension, hyperlipidemia, diabetes mellitus type 2, end-stage renal disease on hemodialysis, left BKA for chronic calcaneus osteomyelitis on 01/12/2020, bipolar disorder, left distal femoral fracture after a fall on 07/19/2021 deemed a nonoperative, admission from 08/03/2021-08/23/2021 for left femoral osteomyelitis requiring partial resection of femur and rearrangements of bony fragments with subsequent antibiotic treatment presented with left hip bleeding and was found to have subacute osteomyelitis and abscess of left femur.  Orthopedic/ID were consulted.  She was started on broad-spectrum antibiotics.  Underwent multiple I&D's by Dr. [name] along with local tissue rearrangement.  She was also seen by vascular surgery who did not fistula branch ligation.  She has completed 6-week course of daptomycin on 10/09/2021.  She is unable to complete hemodialysis in chair and said outpatient hemodialysis unit has been hard to find.  She is otherwise medically stable for discharge.  Assessment & Plan:   Subacute osteomyelitis of left femur with abscess -Status post surgical intervention by Dr. [name].  She has completed 6-week course of daptomycin on 10/09/2021.  Outpatient follow-up with Dr. [name] 1 week after discharge -Continue current muscle relaxants as needed.  States that she is in significant pain while doing physical therapy: Switch oxycodone to every 4 hours as needed. -Reimaging on 12/02/2021 of left knee showed healing fracture with callus -Seen by Dr. [name] again 10/28: looks to be doing well. No further left hip restriction.   Right thigh mild cellulitis -Controlled.  Completed 5-day course of antibiotics on 12/18/2021.  Symptoms most likely from anasarca.  History of left intertrochanteric fracture -Left hip hardware removed on 08/10/2021 prior to  admission   End-stage renal disease on hemodialysis Anasarca -Nephrology following.  Dialysis as per nephrology schedule.  Still on Lasix 80 qd.  Strict input and output with daily weights.  Fluid restriction -on intermittent lokelma- Sun/tThu/Sat-labs periodically -Has to do hemodialysis in chair prior to discharge to outpatient hemodialysis, currently sitting ~ 2 hours  Hyponatremia -Management by hemodialysis.  Monitor intermittently.  Hyperphosphatemia--continue sevelamer. 2,400 tid  Nephrology following.  Monitor intermittently.  Anemia of chronic disease -Hemoglobin currently stable.    Diabetes mellitus type 2 with hyperglycemia -Continue long-acting insulin, NovoLog with meals along with CBGs with SSI---CBG 110-190  Hypertension -Monitor blood pressure.  Continue Coreg 3.125 bid , hydralazine, Lasix 80 qd   History of unspecified CVA Hyperlipidemia -Continue Lipitor  GERD -Continue Protonix and Pepcid  Tobacco use -Tobacco cessation was discussed by prior hospitalist during this hospitalization  Anxiety and depression -Continue amitriptyline 25 hs and Cymbalta qd  Goals of care -Prognosis is guarded to poor.  Remains full code.  Palliative care has already evaluated the patient during this hospitalization   DVT prophylaxis: SCDs Code Status: Full Family Communication: None at bedside Disposition Plan: Status is: Inpatient Remains inpatient appropriate because: Difficult placement due to inability to sit in chair for hemodialysis  Consultants: Orthopedics/nephrology/vascular surgery/ID  Procedures: As above  Antimicrobials:  Anti-infectives (From admission, onward)    Start     Dose/Rate Route Frequency Ordered Stop   12/15/21 1730  cefTRIAXone (ROCEPHIN) 2 g in sodium chloride 0.9 % 100 mL IVPB        2 g 200 mL/hr over 30 Minutes Intravenous Every 24 hours 12/15/21 1633 12/18/21 1750   12/14/21 1800  doxycycline (VIBRA-TABS) tablet 100 mg  Status:   Discontinued  100 mg Oral Every 12 hours 12/14/21 1607 12/15/21 1633   10/02/21 2000  DAPTOmycin (CUBICIN) 900 mg in sodium chloride 0.9 % IVPB  Status:  Discontinued        10 mg/kg  92.4 kg 136 mL/hr over 30 Minutes Intravenous Once per day on Mon Wed Fri 10/01/21 1350 10/12/21 1146   09/30/21 2000  DAPTOmycin (CUBICIN) 900 mg in sodium chloride 0.9 % IVPB  Status:  Discontinued        10 mg/kg  92.4 kg 136 mL/hr over 30 Minutes Intravenous Daily 09/28/21 0919 10/01/21 1350   09/28/21 2000  DAPTOmycin (CUBICIN) 900 mg in sodium chloride 0.9 % IVPB        900 mg 136 mL/hr over 30 Minutes Intravenous  Once 09/28/21 0919 09/28/21 2146   09/02/21 2000  DAPTOmycin (CUBICIN) 900 mg in sodium chloride 0.9 % IVPB  Status:  Discontinued        10 mg/kg  92.4 kg 136 mL/hr over 30 Minutes Intravenous Once per day on Mon Wed Fri 09/02/21 0836 09/28/21 0919   08/31/21 0745  ceFAZolin (ANCEF) IVPB 2g/100 mL premix        2 g 200 mL/hr over 30 Minutes Intravenous To Short Stay 08/31/21 0730 08/31/21 0918   08/30/21 0600  ceFAZolin (ANCEF) IVPB 2g/100 mL premix  Status:  Discontinued        2 g 200 mL/hr over 30 Minutes Intravenous To Short Stay 08/30/21 0123 08/31/21 0600   08/29/21 2000  DAPTOmycin (CUBICIN) 900 mg in sodium chloride 0.9 % IVPB  Status:  Discontinued        10 mg/kg  92.4 kg 136 mL/hr over 30 Minutes Intravenous Every 48 hours 08/29/21 1627 09/02/21 0836   08/28/21 1800  ceFAZolin (ANCEF) IVPB 2g/100 mL premix  Status:  Discontinued        2 g 200 mL/hr over 30 Minutes Intravenous Every M-W-F (1800) 08/27/21 0846 08/29/21 1620   08/28/21 1200  ceFAZolin (ANCEF) IVPB 1 g/50 mL premix  Status:  Discontinued        1 g 100 mL/hr over 30 Minutes Intravenous Every M-W-F (Hemodialysis) 08/26/21 1954 08/27/21 0846   08/28/21 0600  ceFAZolin (ANCEF) IVPB 2g/100 mL premix        2 g 200 mL/hr over 30 Minutes Intravenous On call to O.R. 08/27/21 1150 08/28/21 0909   08/27/21 1800   ceFAZolin (ANCEF) IVPB 1 g/50 mL premix        1 g 100 mL/hr over 30 Minutes Intravenous  Once 08/26/21 1954 08/27/21 2048   08/26/21 1500  ceFEPIme (MAXIPIME) 1 g in sodium chloride 0.9 % 100 mL IVPB  Status:  Discontinued        1 g 200 mL/hr over 30 Minutes Intravenous Every 24 hours 08/26/21 1452 08/26/21 1714        Subjective:  Some pain in back--had diarrhea and discomfort moving from bed to the bedbound urinal  Objective: Vitals:   01/11/22 0927 01/11/22 0940 01/11/22 1005 01/11/22 1245  BP: (!) 207/67 (!) 187/146 (!) 167/56 (!) 160/60  Pulse: 70 67 66   Resp: 17     Temp: 98.6 F (37 C)     TempSrc: Oral     SpO2: 100%     Weight:      Height:        Intake/Output Summary (Last 24 hours) at 01/11/2022 1439 Last data filed at 01/11/2022 0900 Gross per 24 hour  Intake 400 ml  Output --  Net 400 ml    Filed Weights   01/10/22 0718 01/10/22 1127 01/10/22 2207  Weight: 95 kg 92.7 kg 93.5 kg    Examination:  Eomi ncat no focal deficit no issue Cta b no added sound no wheeze no rales no rhonchi Abd soft nt nd no rebound no guard LLE not red--some Le thigh swelling--sees improved from prior  Data Reviewed: I have personally reviewed following labs and imaging studies  CBC: Recent Labs  Lab 01/09/22 1124 01/10/22 0724  WBC 7.5 7.2  HGB 10.6* 10.4*  HCT 32.4* 31.5*  MCV 88.0 87.5  PLT 241 027    Basic Metabolic Panel: Recent Labs  Lab 01/09/22 1124 01/10/22 0724  NA 128* 125*  K 4.1 4.7  CL 89* 87*  CO2 28 25  GLUCOSE 181* 132*  BUN 39* 52*  CREATININE 4.69* 5.29*  CALCIUM 8.8* 9.1  PHOS 3.6 5.0*    GFR: Estimated Creatinine Clearance: 15.1 mL/min (A) (by C-G formula based on SCr of 5.29 mg/dL (H)). Liver Function Tests: Recent Labs  Lab 01/09/22 1124 01/10/22 0724  ALBUMIN 3.4* 3.4*    No results for input(s): "LIPASE", "AMYLASE" in the last 168 hours. No results for input(s): "AMMONIA" in the last 168 hours. Coagulation  Profile: No results for input(s): "INR", "PROTIME" in the last 168 hours. Cardiac Enzymes: No results for input(s): "CKTOTAL", "CKMB", "CKMBINDEX", "TROPONINI" in the last 168 hours. BNP (last 3 results) No results for input(s): "PROBNP" in the last 8760 hours. HbA1C: No results for input(s): "HGBA1C" in the last 72 hours. CBG: Recent Labs  Lab 01/09/22 2119 01/10/22 0619 01/10/22 1217 01/10/22 1619 01/10/22 2209  GLUCAP 143* 125* 120* 235* 116*    Lipid Profile: No results for input(s): "CHOL", "HDL", "LDLCALC", "TRIG", "CHOLHDL", "LDLDIRECT" in the last 72 hours. Thyroid Function Tests: No results for input(s): "TSH", "T4TOTAL", "FREET4", "T3FREE", "THYROIDAB" in the last 72 hours. Anemia Panel: No results for input(s): "VITAMINB12", "FOLATE", "FERRITIN", "TIBC", "IRON", "RETICCTPCT" in the last 72 hours. Sepsis Labs: No results for input(s): "PROCALCITON", "LATICACIDVEN" in the last 168 hours.  No results found for this or any previous visit (from the past 240 hour(s)).       Radiology Studies: No results found. Scheduled Meds:  (feeding supplement) PROSource Plus  30 mL Oral BID BM   alosetron  1 mg Oral BID   amitriptyline  25 mg Oral QHS   atorvastatin  40 mg Oral q1800   carvedilol  3.125 mg Oral BID   Chlorhexidine Gluconate Cloth  6 each Topical Q0600   darbepoetin (ARANESP) injection - DIALYSIS  100 mcg Subcutaneous Q Fri-1800   docusate sodium  200 mg Oral BID   DULoxetine  60 mg Oral Daily   famotidine  20 mg Oral Daily   fentaNYL  1 patch Transdermal Q72H   furosemide  80 mg Oral Daily   hydrALAZINE  50 mg Oral TID   insulin aspart  0-9 Units Subcutaneous TID WC   insulin aspart  4 Units Subcutaneous TID WC   insulin glargine-yfgn  10 Units Subcutaneous QHS   multivitamin  1 tablet Oral QHS   pantoprazole  80 mg Oral Daily   polyethylene glycol  17 g Oral Daily   sevelamer carbonate  2,400 mg Oral TID WC   sodium zirconium cyclosilicate  10 g Oral  Once per day on Sun Tue Thu Sat   Continuous Infusions:  albumin human      Jai-Gurmukh Ethelle Ola,  MD [facility] 01/11/2022, 2:39 PM

## 2022-01-12 NOTE — Progress Notes (Signed)
Physical Therapy Treatment Patient Details Name: [name] MRN: [id number] DOB: 1965-09-14 Today's Date: 01/12/2022   History of Present Illness 56 y.o. female presented to ED 6/26 from dialysis with increased bloody drainage from L hip wound. 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 s/p hardware removal of left hip with partial resection of tuft tissue and femure that were nonviable on 08/10/21 Discharged home 6/23. underwent L hip debridement and wound vac placement 6/28; +Left intertrochanteric non-union,  Fracture of left distal femur  PMH: hypertension, hyperlipidemia, ESRD on HD MWF, history of left BKA in 2021, depression/anxiety, stroke, tobacco use, T2DM,  insomnia, chronic pain syndrome,    PT Comments    Continuing work on functional mobility and activity tolerance;  Session today focused on providing pt with her printed HEP, answering questions about her HEP, planning with Mobility Specialist for consistent (at or very near daily) stretching L hip flexor with over pressure in R sidelying; We discussed easing into putting more weight into L hip, and trying quadruped therex -- then perhaps prgress to R foot on the floor and L knee on the bed in "half tall kneel and half stand" posture; Would like to add standing back to the activities we will work on in the near future;   She had questions about the firmness of her L calf muscle, which feels like a large muscular knot; Provided soft tissue deep pressure and soft tissue mobilization and massage, with good calf muscle release; Educated pt on doing soft tissue mobilization herself, and she verbalized understanding;   We also discussed shifting gears to being more independent with mobility and ADLs -- now that her wheelchair is here, we can move her bed closer to the door, which will allow more room for Marshea to be up and around in her wc, including getting to the [facility] at bedside OR in the bathroom over  the actual toilet; Plan of getting a drop-arm BSC to her room this week;   Helped pt don her shrinker, and re-emphasized the need to wear it at least every night.   Recommendations for follow up therapy are one component of a multi-disciplinary discharge planning process, led by the attending physician.  Recommendations may be updated based on patient status, additional functional criteria and insurance authorization.  Follow Up Recommendations  Skilled nursing-short term rehab (<3 hours/day) (pt currently refusing due to inability to tolerate sitting in chair for 6+ hours as transported to/from dialysis) Can patient physically be transported by private vehicle: No   Assistance Recommended at Discharge Intermittent Supervision/Assistance  Patient can return home with the following A lot of help with bathing/dressing/bathroom;Assistance with cooking/housework;Assist for transportation;Two people to help with walking and/or transfers;Help with stairs or ramp for entrance   Equipment Recommendations  None recommended by PT    Recommendations for Other Services       Precautions / Restrictions Precautions Precautions: Fall;Other (comment) Precaution Comments: L BKA (baseline) Restrictions LLE Weight Bearing: Weight bearing as tolerated Other Position/Activity Restrictions: L distal femur fx; per Dr [name] 8/10 pt can full WB     Mobility  Bed Mobility                    Transfers                        Ambulation/Gait  Stairs             Wheelchair Mobility    Modified Rankin (Stroke Patients Only)       Balance                                            Cognition Arousal/Alertness: Awake/alert Behavior During Therapy: WFL for tasks assessed/performed Overall Cognitive Status: Within Functional Limits for tasks assessed                                          Exercises Amputee  Exercises Knee Extension: PROM, Left, Supine (knee extension stretching)    General Comments General comments (skin integrity, edema, etc.): Performed deep soft tissue mobilization to L calf muscle to loosen what feels like muscular knots to palpation; Educated pt to ask for a heat pad, apply heat for 10 minutes, tehn she can work on the tight muscualr knots in her L calf as well      Pertinent Vitals/Pain Pain Assessment Pain Assessment: Faces Faces Pain Scale: Hurts even more Pain Location: L calf with soft tissue work Pain Descriptors / Indicators: Discomfort, Grimacing, Guarding Pain Intervention(s): Monitored during session    Home Living                          Prior Function            PT Goals (current goals can now be found in the care plan section) Acute Rehab PT Goals Patient Stated Goal: less pain with movement PT Goal Formulation: With patient Time For Goal Achievement: 01/23/22 Potential to Achieve Goals: Fair Progress towards PT goals: Progressing toward goals (slowly)    Frequency    Min 2X/week      PT Plan Current plan remains appropriate    Co-evaluation              AM-PAC PT "6 Clicks" Mobility   Outcome Measure  Help needed turning from your back to your side while in a flat bed without using bedrails?: None Help needed moving from lying on your back to sitting on the side of a flat bed without using bedrails?: None Help needed moving to and from a bed to a chair (including a wheelchair)?: A Little Help needed standing up from a chair using your arms (e.g., wheelchair or bedside chair)?: Total Help needed to walk in hospital room?: Total Help needed climbing 3-5 steps with a railing? : Total 6 Click Score: 14    End of Session   Activity Tolerance: Patient tolerated treatment well Patient left: in bed;with call bell/phone within reach Nurse Communication: Mobility status PT Visit Diagnosis: Other abnormalities of gait  and mobility (R26.89);Muscle weakness (generalized) (M62.81);History of falling (Z91.81);Pain Pain - Right/Left: Left Pain - part of body: Leg     Time: 1445-1520 PT Time Calculation (min) (ACUTE ONLY): 35 min  Charges:  $Therapeutic Activity: 8-22 mins $Self Care/Home Management: 8-22 $Massage: 8-22 mins                     Roney Marion, PT  Acute Rehabilitation Services Office [phone]    Colletta [place] 01/12/2022, 4:12 PM

## 2022-01-12 NOTE — Progress Notes (Signed)
Remains stable-no new changes Sitting up some  No fever chills n/v/cp  Needs to be seated ~ 4 hours to be able to discharge--continues to work with therapy to get towards that goal--plan to be oob tomorrow with therapy  No charge   Verneita Griffes, MD [facility] 4:17 PM

## 2022-01-13 DIAGNOSIS — M86252 Subacute osteomyelitis, left femur: Secondary | ICD-10-CM | POA: Diagnosis not present

## 2022-01-13 LAB — GLUCOSE, CAPILLARY
Glucose-Capillary: 168 mg/dL — ABNORMAL HIGH (ref 70–99)
Glucose-Capillary: 269 mg/dL — ABNORMAL HIGH (ref 70–99)
Glucose-Capillary: 179 mg/dL — ABNORMAL HIGH (ref 70–99)
Glucose-Capillary: 233 mg/dL — ABNORMAL HIGH (ref 70–99)
Glucose-Capillary: 170 mg/dL — ABNORMAL HIGH (ref 70–99)
Glucose-Capillary: 174 mg/dL — ABNORMAL HIGH (ref 70–99)
Glucose-Capillary: 207 mg/dL — ABNORMAL HIGH (ref 70–99)
Glucose-Capillary: 182 mg/dL — ABNORMAL HIGH (ref 70–99)
Glucose-Capillary: 153 mg/dL — ABNORMAL HIGH (ref 70–99)
Glucose-Capillary: 171 mg/dL — ABNORMAL HIGH (ref 70–99)
Glucose-Capillary: 181 mg/dL — ABNORMAL HIGH (ref 70–99)
Glucose-Capillary: 142 mg/dL — ABNORMAL HIGH (ref 70–99)

## 2022-01-13 LAB — RENAL FUNCTION PANEL
Albumin: 3.5 g/dL (ref 3.5–5.0)
Anion gap: 17 — ABNORMAL HIGH (ref 5–15)
BUN: 85 mg/dL — ABNORMAL HIGH (ref 6–20)
CO2: 21 mmol/L — ABNORMAL LOW (ref 22–32)
Calcium: 8.6 mg/dL — ABNORMAL LOW (ref 8.9–10.3)
Chloride: 88 mmol/L — ABNORMAL LOW (ref 98–111)
Creatinine, Ser: 6.6 mg/dL — ABNORMAL HIGH (ref 0.44–1.00)
GFR, Estimated: 7 mL/min — ABNORMAL LOW (ref >60)
Glucose, Bld: 164 mg/dL — ABNORMAL HIGH (ref 70–99)
Phosphorus: 5.9 mg/dL — ABNORMAL HIGH (ref 2.5–4.6)
Potassium: 4.9 mmol/L (ref 3.5–5.1)
Sodium: 126 mmol/L — ABNORMAL LOW (ref 135–145)

## 2022-01-13 LAB — CBC
HCT: 31.7 % — ABNORMAL LOW (ref 36.0–46.0)
Hemoglobin: 10.1 g/dL — ABNORMAL LOW (ref 12.0–15.0)
MCH: 28.8 pg (ref 26.0–34.0)
MCHC: 31.9 g/dL (ref 30.0–36.0)
MCV: 90.3 fL (ref 80.0–100.0)
Platelets: 223 10*3/uL (ref 150–400)
RBC: 3.51 MIL/uL — ABNORMAL LOW (ref 3.87–5.11)
RDW: 18.1 % — ABNORMAL HIGH (ref 11.5–15.5)
WBC: 9.7 10*3/uL (ref 4.0–10.5)
nRBC: 0 % (ref 0.0–0.2)

## 2022-01-13 MED ORDER — HEPARIN SODIUM (PORCINE) 1000 UNIT/ML IJ SOLN
4000.0000 [IU] | Freq: Once | INTRAMUSCULAR | Status: AC
  Administered 2022-01-14: 4000 [IU] via INTRAVENOUS
  Filled 2022-01-13: qty 4

## 2022-01-13 MED ORDER — HEPARIN SODIUM (PORCINE) 1000 UNIT/ML IJ SOLN
2000.0000 [IU] | Freq: Once | INTRAMUSCULAR | Status: AC
  Filled 2022-01-13 (×2): qty 2

## 2022-01-13 NOTE — Progress Notes (Signed)
[facility] Progress Note   Subjective:   Seen in room this am. No new events. Doesn't want to discuss recliner dialysis. Doesn't think she's ready and not wanting to discuss a timeline.  Informed by dialysis RN that she refused dialysis this afternoon.   Objective Vitals:   01/12/22 1701 01/12/22 2039 01/13/22 0511 01/13/22 0939  BP: (!) 152/66 (!) 149/57 (!) 179/63 (!) 171/61  Pulse:  (!) 58 (!) 58 (!) 58  Resp:  18 18 16   Temp: 97.7 F (36.5 C) 98.3 F (36.8 C) 98.4 F (36.9 C) 98.6 F (37 C)  TempSrc: Oral   Oral  SpO2:  100% 100% 99%  Weight:      Height:       Physical Exam General: [name] Heart: RRR, no murmurs, rubs or gallops Lungs: CTA bilaterally, no wheezing, rhonchi or rales Abdomen: Soft, non-distended, +BS Extremities: trace edema L hip Dialysis Access:  AVF + bruit  Additional Objective Labs:  Blood Culture    Component Value Date/Time   SDES TISSUE 08/28/2021 0917   SPECREQUEST LEFT THIGH TISSUE 08/28/2021 0917   CULT  08/28/2021 0917    MODERATE ENTEROCOCCUS FAECIUM VANCOMYCIN RESISTANT ENTEROCOCCUS ISOLATED NO ANAEROBES ISOLATED Sent to [facility] for further susceptibility testing. Performed at [facility], [place] [address], [place], [place] [address]    REPTSTATUS 09/02/2021 FINAL 08/28/2021 1914    Cardiac Enzymes: No results for input(s): "CKTOTAL", "CKMB", "CKMBINDEX", "TROPONINI" in the last 168 hours. CBG: Recent Labs  Lab 01/12/22 1145 01/12/22 1648 01/12/22 2037 01/13/22 0733 01/13/22 1120  GLUCAP 174* 207* 182* 153* 171*    Iron Studies: No results for input(s): "IRON", "TIBC", "TRANSFERRIN", "FERRITIN" in the last 72 hours. @lablastinr3 @ Studies/Results: No results found. Medications:  albumin human      (feeding supplement) PROSource Plus  30 mL Oral BID BM   alosetron  1 mg Oral BID   amitriptyline  25 mg Oral QHS   atorvastatin  40 mg Oral q1800   carvedilol  3.125 mg Oral BID    Chlorhexidine Gluconate Cloth  6 each Topical Q0600   darbepoetin (ARANESP) injection - DIALYSIS  100 mcg Subcutaneous Q Fri-1800   docusate sodium  200 mg Oral BID   DULoxetine  60 mg Oral Daily   famotidine  20 mg Oral Daily   fentaNYL  1 patch Transdermal Q72H   furosemide  80 mg Oral Daily   heparin sodium (porcine)  2,000 Units Intravenous Once   heparin sodium (porcine)  4,000 Units Intravenous Once   hydrALAZINE  50 mg Oral TID   insulin aspart  0-9 Units Subcutaneous TID WC   insulin aspart  4 Units Subcutaneous TID WC   insulin glargine-yfgn  10 Units Subcutaneous QHS   multivitamin  1 tablet Oral QHS   pantoprazole  80 mg Oral Daily   polyethylene glycol  17 g Oral Daily   sevelamer carbonate  2,400 mg Oral TID WC   sodium zirconium cyclosilicate  10 g Oral Once per day on Sun Tue Thu Sat    Outpatient Dialysis Orders: MWF at [facility], 400/500, EDW 90kg, 2K/2Ca, LUE AVF - heparin 4000 unit IV bolus, no ESA    Assessment/Plan: Recurrent L hip osteomyelitis: surgery in June 2023  (removal hip IMN which was placed 12/2019) with persistent VRE infection. Completed 6 wk course of IV Daptomycin on 10/12/21. Per Dr. Jess Barters note 12/28/2021 "NO RESTRICTIONS L HIP".  Chronic L hip/residual limb  pain: She does have fractures of L femoral neck (where hardware was removed) as well as distal femur Fx. Pain control per primary.  ESRD: Continue HD on usual MWF schedule. Clots HD system without heparin - using 4000 unit bolus and 2000 unit mid-run bolus.  Hyperkalemia: Stable on scheduled lokelma 4 days a week. Labs were not done pre-HD yesterday, will order them today.  HTN/volume: BP meds have been tapered down - currently on Coreg 3.125mg  BID only.  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 Last tsat 54%. Continue Aranesp 167mcg weekly. Rechecking CBC today.  Secondary HPTH: CorrCa slightly  high, not on VDRA. Phos variable, usually 5-6. Continue Renvela as binder. Nutrition: Alb low, continue protein supplements.  Renal diet w/fluid restrictions.  Rechecking labs today.  AVF dysfunction (resolved): S/p branch ligation 09/19/2021  Dr. Virl Cagey. AVF working well now. DM2: Insulin per primary Dispo: SNF previously recommended, but she declined. Not home HD candidate.  Dialyzed in recliner on 9/4 but signed off early due to pain, has declined to come in a recliner since then.Strongly discussed on the importance of increasing her mobility as much as possible while she is in her room in addition to following her ongoing fluid restriction. Also reinforced the importance of being in the chair for HD treatments to improve strength and mobility. Our goal is to dialyze her in the mornings so the patient has more time to work with PT in the afternoons (we will do our best to accommodate this as we still need to always consider the emergent cases).  Encouraged ongoing progress. Per Renal Navigator notes there are no clinics that will accept patient unless she will have HD in chair. Will continue to encourage use of chair for HD  Lynnda Child PA-C [facility] 01/13/2022,3:26 PM

## 2022-01-13 NOTE — Progress Notes (Addendum)
Pt called unit states she is not coming to HD today advised of risks pvu PA noted

## 2022-01-13 NOTE — Progress Notes (Signed)
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  Orthopedic/ID were consulted.  She was started on broad-spectrum antibiotics.  Underwent multiple I&D's by Dr. [name] along with local tissue rearrangement.  She was also seen by vascular surgery who did not fistula branch ligation.  She has completed 6-week course of daptomycin on 10/09/2021.  She is unable to complete hemodialysis in chair and said outpatient hemodialysis unit has been hard to find.  She is otherwise medically stable for discharge.  Assessment & Plan:   Subacute osteomyelitis of left femur with abscess -Status post surgical intervention by Dr. [name].  She has completed 6-week course of daptomycin on 10/09/2021.  Outpatient follow-up with Dr. [name] 1 week after discharge -Continue current muscle relaxants as needed.  States that she is in significant pain while doing physical therapy: Switch oxycodone to every 4 hours as needed. -Reimaging on 12/02/2021 of left knee showed healing fracture with callus -Seen by Dr. [name] again 10/28: looks to be doing well. No further left hip restriction.   Right thigh mild cellulitis -Controlled.  Completed 5-day course of antibiotics on 12/18/2021.  Symptoms most likely from anasarca.`  History of left intertrochanteric fracture -Left hip hardware removed on 08/10/2021 prior  to admission - Physical therapy working with patient to scoot and mobilize with wheelchair now that it is present  End-stage renal disease on hemodialysis Anasarca -Nephrology following.  Dialysis as per nephrology schedule.  Still on Lasix 80 qd.  Strict input and output with daily weights.  Fluid restriction -on intermittent lokelma- Sun/tThu/Sat-labs periodically -Has to do hemodialysis in chair prior to discharge to outpatient hemodialysis, currently sitting ~ 2 hours-see above  Hyponatremia -Management by hemodialysis.  Monitor intermittently.  Hyperphosphatemia--continue sevelamer. 2,400 tid  Nephrology following.  Monitor intermittently.  Anemia of chronic disease -Hemoglobin currently stable.    Diabetes mellitus type 2 with hyperglycemia -Continue long-acting insulin, NovoLog with meals along with CBGs with SSI---CBG 100s to 200s moderate control  Hypertension -Monitor blood pressure.  Continue Coreg 3.125 bid , hydralazine 50 tid scheduled started 11/11, Lasix 80 qd   History of unspecified CVA Hyperlipidemia -Continue Lipitor  GERD -Continue Protonix and Pepcid  Tobacco use -Tobacco cessation was discussed by prior hospitalist during this hospitalization  Anxiety and depression -Continue amitriptyline 25 hs and Cymbalta qd  Goals of care -Prognosis is guarded to poor.  Remains full code.  Palliative care has already evaluated the patient during this hospitalization   DVT prophylaxis: SCDs Code Status: Full Family Communication: None at bedside Disposition Plan: Status is: Inpatient Remains inpatient appropriate because: Difficult placement due to inability to sit in chair for hemodialysis  Consultants: Orthopedics/nephrology/vascular surgery/ID  Procedures: As above  Antimicrobials:  Anti-infectives (From admission, onward)    Start     Dose/Rate Route Frequency Ordered Stop   12/15/21 1730  cefTRIAXone (ROCEPHIN) 2 g in sodium chloride 0.9 % 100 mL  IVPB        2 g 200 mL/hr  over 30 Minutes Intravenous Every 24 hours 12/15/21 1633 12/18/21 1750   12/14/21 1800  doxycycline (VIBRA-TABS) tablet 100 mg  Status:  Discontinued        100 mg Oral Every 12 hours 12/14/21 1607 12/15/21 1633   10/02/21 2000  DAPTOmycin (CUBICIN) 900 mg in sodium chloride 0.9 % IVPB  Status:  Discontinued        10 mg/kg  92.4 kg 136 mL/hr over 30 Minutes Intravenous Once per day on Mon Wed Fri 10/01/21 1350 10/12/21 1146   09/30/21 2000  DAPTOmycin (CUBICIN) 900 mg in sodium chloride 0.9 % IVPB  Status:  Discontinued        10 mg/kg  92.4 kg 136 mL/hr over 30 Minutes Intravenous Daily 09/28/21 0919 10/01/21 1350   09/28/21 2000  DAPTOmycin (CUBICIN) 900 mg in sodium chloride 0.9 % IVPB        900 mg 136 mL/hr over 30 Minutes Intravenous  Once 09/28/21 0919 09/28/21 2146   09/02/21 2000  DAPTOmycin (CUBICIN) 900 mg in sodium chloride 0.9 % IVPB  Status:  Discontinued        10 mg/kg  92.4 kg 136 mL/hr over 30 Minutes Intravenous Once per day on Mon Wed Fri 09/02/21 0836 09/28/21 0919   08/31/21 0745  ceFAZolin (ANCEF) IVPB 2g/100 mL premix        2 g 200 mL/hr over 30 Minutes Intravenous To Short Stay 08/31/21 0730 08/31/21 0918   08/30/21 0600  ceFAZolin (ANCEF) IVPB 2g/100 mL premix  Status:  Discontinued        2 g 200 mL/hr over 30 Minutes Intravenous To Short Stay 08/30/21 0123 08/31/21 0600   08/29/21 2000  DAPTOmycin (CUBICIN) 900 mg in sodium chloride 0.9 % IVPB  Status:  Discontinued        10 mg/kg  92.4 kg 136 mL/hr over 30 Minutes Intravenous Every 48 hours 08/29/21 1627 09/02/21 0836   08/28/21 1800  ceFAZolin (ANCEF) IVPB 2g/100 mL premix  Status:  Discontinued        2 g 200 mL/hr over 30 Minutes Intravenous Every M-W-F (1800) 08/27/21 0846 08/29/21 1620   08/28/21 1200  ceFAZolin (ANCEF) IVPB 1 g/50 mL premix  Status:  Discontinued        1 g 100 mL/hr over 30 Minutes Intravenous Every M-W-F (Hemodialysis) 08/26/21 1954 08/27/21 0846    08/28/21 0600  ceFAZolin (ANCEF) IVPB 2g/100 mL premix        2 g 200 mL/hr over 30 Minutes Intravenous On call to O.R. 08/27/21 1150 08/28/21 0909   08/27/21 1800  ceFAZolin (ANCEF) IVPB 1 g/50 mL premix        1 g 100 mL/hr over 30 Minutes Intravenous  Once 08/26/21 1954 08/27/21 2048   08/26/21 1500  ceFEPIme (MAXIPIME) 1 g in sodium chloride 0.9 % 100 mL IVPB  Status:  Discontinued        1 g 200 mL/hr over 30 Minutes Intravenous Every 24 hours 08/26/21 1452 08/26/21 1714        Subjective:  Overall fair no new distress sitting up seems comfortable Upset that did not get to dialysis on time and refused it today because she wanted to ensure that she ate has is a diabetic Therapy has not seen her today yet but they are working on a plan  Objective: Vitals:   01/12/22 1701 01/12/22 2039 01/13/22 0511 01/13/22 0939  BP: (!) 152/66 (!) 149/57 (!) 179/63 (!) 171/61  Pulse:  Marland Kitchen)  58 (!) 58 (!) 58  Resp:  18 18 16   Temp: 97.7 F (36.5 C) 98.3 F (36.8 C) 98.4 F (36.9 C) 98.6 F (37 C)  TempSrc: Oral   Oral  SpO2:  100% 100% 99%  Weight:      Height:        Intake/Output Summary (Last 24 hours) at 01/13/2022 1526 Last data filed at 01/13/2022 1443 Gross per 24 hour  Intake 360 ml  Output 825 ml  Net -465 ml    Filed Weights   01/10/22 0718 01/10/22 1127 01/10/22 2207  Weight: 95 kg 92.7 kg 93.5 kg    Examination:  Eomi ncat no focal deficit no issue Cta b no added sound no wheeze no rales no rhonchi Abd soft nt nd no rebound no guard Did not examine lower extremities today  Data Reviewed: I have personally reviewed following labs and imaging studies  CBC: Recent Labs  Lab 01/09/22 1124 01/10/22 0724  WBC 7.5 7.2  HGB 10.6* 10.4*  HCT 32.4* 31.5*  MCV 88.0 87.5  PLT 241 456    Basic Metabolic Panel: Recent Labs  Lab 01/09/22 1124 01/10/22 0724  NA 128* 125*  K 4.1 4.7  CL 89* 87*  CO2 28 25  GLUCOSE 181* 132*  BUN 39* 52*  CREATININE 4.69*  5.29*  CALCIUM 8.8* 9.1  PHOS 3.6 5.0*    GFR: Estimated Creatinine Clearance: 15.1 mL/min (A) (by C-G formula based on SCr of 5.29 mg/dL (H)). Liver Function Tests: Recent Labs  Lab 01/09/22 1124 01/10/22 0724  ALBUMIN 3.4* 3.4*    No results for input(s): "LIPASE", "AMYLASE" in the last 168 hours. No results for input(s): "AMMONIA" in the last 168 hours. Coagulation Profile: No results for input(s): "INR", "PROTIME" in the last 168 hours. Cardiac Enzymes: No results for input(s): "CKTOTAL", "CKMB", "CKMBINDEX", "TROPONINI" in the last 168 hours. BNP (last 3 results) No results for input(s): "PROBNP" in the last 8760 hours. HbA1C: No results for input(s): "HGBA1C" in the last 72 hours. CBG: Recent Labs  Lab 01/12/22 1145 01/12/22 1648 01/12/22 2037 01/13/22 0733 01/13/22 1120  GLUCAP 174* 207* 182* 153* 171*    Lipid Profile: No results for input(s): "CHOL", "HDL", "LDLCALC", "TRIG", "CHOLHDL", "LDLDIRECT" in the last 72 hours. Thyroid Function Tests: No results for input(s): "TSH", "T4TOTAL", "FREET4", "T3FREE", "THYROIDAB" in the last 72 hours. Anemia Panel: No results for input(s): "VITAMINB12", "FOLATE", "FERRITIN", "TIBC", "IRON", "RETICCTPCT" in the last 72 hours. Sepsis Labs: No results for input(s): "PROCALCITON", "LATICACIDVEN" in the last 168 hours.  No results found for this or any previous visit (from the past 240 hour(s)).       Radiology Studies: No results found. Scheduled Meds:  (feeding supplement) PROSource Plus  30 mL Oral BID BM   alosetron  1 mg Oral BID   amitriptyline  25 mg Oral QHS   atorvastatin  40 mg Oral q1800   carvedilol  3.125 mg Oral BID   Chlorhexidine Gluconate Cloth  6 each Topical Q0600   darbepoetin (ARANESP) injection - DIALYSIS  100 mcg Subcutaneous Q Fri-1800   docusate sodium  200 mg Oral BID   DULoxetine  60 mg Oral Daily   famotidine  20 mg Oral Daily   fentaNYL  1 patch Transdermal Q72H   furosemide  80 mg  Oral Daily   heparin sodium (porcine)  2,000 Units Intravenous Once   heparin sodium (porcine)  4,000 Units Intravenous Once   hydrALAZINE  50 mg  Oral TID   insulin aspart  0-9 Units Subcutaneous TID WC   insulin aspart  4 Units Subcutaneous TID WC   insulin glargine-yfgn  10 Units Subcutaneous QHS   multivitamin  1 tablet Oral QHS   pantoprazole  80 mg Oral Daily   polyethylene glycol  17 g Oral Daily   sevelamer carbonate  2,400 mg Oral TID WC   sodium zirconium cyclosilicate  10 g Oral Once per day on Sun Tue Thu Sat   Continuous Infusions:  albumin human      Nita Sells, MD [facility] 01/13/2022, 3:26 PM

## 2022-01-14 DIAGNOSIS — M86252 Subacute osteomyelitis, left femur: Secondary | ICD-10-CM | POA: Diagnosis not present

## 2022-01-14 LAB — GLUCOSE, CAPILLARY
Glucose-Capillary: 132 mg/dL — ABNORMAL HIGH (ref 70–99)
Glucose-Capillary: 182 mg/dL — ABNORMAL HIGH (ref 70–99)
Glucose-Capillary: 142 mg/dL — ABNORMAL HIGH (ref 70–99)
Glucose-Capillary: 211 mg/dL — ABNORMAL HIGH (ref 70–99)

## 2022-01-14 LAB — CBC
HCT: 28.8 % — ABNORMAL LOW (ref 36.0–46.0)
Hemoglobin: 9.4 g/dL — ABNORMAL LOW (ref 12.0–15.0)
MCH: 29 pg (ref 26.0–34.0)
MCHC: 32.6 g/dL (ref 30.0–36.0)
MCV: 88.9 fL (ref 80.0–100.0)
Platelets: 212 10*3/uL (ref 150–400)
RBC: 3.24 MIL/uL — ABNORMAL LOW (ref 3.87–5.11)
RDW: 17.8 % — ABNORMAL HIGH (ref 11.5–15.5)
WBC: 9.4 10*3/uL (ref 4.0–10.5)
nRBC: 0 % (ref 0.0–0.2)

## 2022-01-14 LAB — RENAL FUNCTION PANEL
Albumin: 3.2 g/dL — ABNORMAL LOW (ref 3.5–5.0)
Anion gap: 16 — ABNORMAL HIGH (ref 5–15)
BUN: 88 mg/dL — ABNORMAL HIGH (ref 6–20)
CO2: 22 mmol/L (ref 22–32)
Calcium: 8.5 mg/dL — ABNORMAL LOW (ref 8.9–10.3)
Chloride: 88 mmol/L — ABNORMAL LOW (ref 98–111)
Creatinine, Ser: 6.9 mg/dL — ABNORMAL HIGH (ref 0.44–1.00)
GFR, Estimated: 7 mL/min — ABNORMAL LOW (ref >60)
Glucose, Bld: 195 mg/dL — ABNORMAL HIGH (ref 70–99)
Phosphorus: 6.5 mg/dL — ABNORMAL HIGH (ref 2.5–4.6)
Potassium: 5.6 mmol/L — ABNORMAL HIGH (ref 3.5–5.1)
Sodium: 126 mmol/L — ABNORMAL LOW (ref 135–145)

## 2022-01-14 MED ORDER — HEPARIN SODIUM (PORCINE) 1000 UNIT/ML IJ SOLN
INTRAMUSCULAR | Status: AC
  Filled 2022-01-14: qty 4

## 2022-01-14 NOTE — Progress Notes (Signed)
Received patient in bed to unit.  Alert and oriented.  Informed consent signed and in chart.   Treatment initiated: 6440 Treatment completed: 1256  Patient tolerated well.  Transported back to the room  Alert, without acute distress.  Hand-off given to patient's nurse. Dressing c/d/I.   Access used: left AVF  Access issues: no  Total UF removed: 4000 ml Medication(s) given:heparin, tylenol and oxycodone Post HD VS: 98.2, 68, 14, 152/49, 100% RA Post HD weight: 95.9kg    [facility]

## 2022-01-14 NOTE — Progress Notes (Signed)
PROGRESS NOTE    [name]  IRW:[id number] DOB: [birth year] DOA: 08/26/2021 PCP: [name], MD   Brief Narrative:  56 year old female with history of hypertension, hyperlipidemia, diabetes mellitus type 2, end-stage renal disease on hemodialysis, left BKA for chronic calcaneus osteomyelitis on 01/12/2020, bipolar disorder, left distal femoral fracture after a fall on 07/19/2021 deemed a nonoperative, admission from 08/03/2021-08/23/2021 for left femoral osteomyelitis requiring partial resection of femur and rearrangements of bony fragments with subsequent antibiotic treatment presented with left hip bleeding and was found to have subacute osteomyelitis and abscess of left femur.  Orthopedic/ID were consulted.  She was started on broad-spectrum antibiotics.  Underwent multiple I&D's by Dr. [name] along with local tissue rearrangement.  She was also seen by vascular surgery who did not fistula branch ligation.  She has completed 6-week course of daptomycin on 10/09/2021.  She is unable to complete hemodialysis in chair and said outpatient hemodialysis unit has been hard to find.  She is otherwise medically stable for discharge.  Assessment & Plan:   Subacute osteomyelitis of left femur with abscess -Status post surgical intervention by Dr. [name].  She has completed 6-week course of daptomycin on 10/09/2021.  Outpatient follow-up with Dr. [name] 1 week after discharge -Continue current muscle relaxants as needed.  -Oxycodone q hours as needed, needs to be premedicated prior to mobility -Reimaging on 12/02/2021 of left knee showed healing fracture with callus -Seen by Dr. [name] again 10/28: looks to be doing well. No further left hip restriction.   Right thigh mild cellulitis -Controlled.  Completed 5-day course of antibiotics on 12/18/2021.  Symptoms most likely from anasarca.`  History of left intertrochanteric fracture -Left hip hardware removed on 08/10/2021 prior to admission - Physical therapy working  with patient to scoot and mobilize with wheelchair now that it is present -Continue to work with therapy-needs to sit up at least 4 hours to be able to be considered for outpatient dialysis  End-stage renal disease on hemodialysis Anasarca -Nephrology following.  Dialysis as per nephrology schedule.  Still on Lasix 80 qd.  Strict input and output with daily weights.  Fluid restriction -on intermittent lokelma- Sun/tThu/Sat-labs periodically -Has to do hemodialysis in chair prior to discharge to outpatient hemodialysis, currently sitting ~ 2 hours-see above -May require increasing binders as per  Hyponatremia -Management by hemodialysis.  Monitor intermittently.  Hyperphosphatemia--continue sevelamer. 2,400 tid  Nephrology following.  Monitor intermittently.  Anemia of chronic disease -Hemoglobin currently stable.    Diabetes mellitus type 2 with hyperglycemia -Continue long-acting insulin, NovoLog with meals along with CBGs with SSI---CBG 100s to 180 moderate control  Hypertension -Monitor blood pressure.  Continue Coreg 3.125 bid , hydralazine 50 tid scheduled started 11/11, Lasix 80 qd   History of unspecified CVA Hyperlipidemia -Continue Lipitor  GERD -Continue Protonix and Pepcid  Tobacco use -Tobacco cessation was discussed by prior hospitalist during this hospitalization  Anxiety and depression -Continue amitriptyline 25 hs and Cymbalta qd  Goals of care -Prognosis is guarded to poor.  Remains full code.  Palliative care has already evaluated the patient during this hospitalization   DVT prophylaxis: SCDs Code Status: Full Family Communication: None at bedside Disposition Plan: Status is: Inpatient Remains inpatient appropriate because: Difficult placement due to inability to sit in chair for hemodialysis  Consultants: Orthopedics/nephrology/vascular surgery/ID  Procedures: As above  Antimicrobials:  Anti-infectives (From admission, onward)    Start      Dose/Rate Route Frequency Ordered Stop   12/15/21 1730  cefTRIAXone (ROCEPHIN) 2  g in sodium chloride 0.9 % 100 mL IVPB        2 g 200 mL/hr over 30 Minutes Intravenous Every 24 hours 12/15/21 1633 12/18/21 1750   12/14/21 1800  doxycycline (VIBRA-TABS) tablet 100 mg  Status:  Discontinued        100 mg Oral Every 12 hours 12/14/21 1607 12/15/21 1633   10/02/21 2000  DAPTOmycin (CUBICIN) 900 mg in sodium chloride 0.9 % IVPB  Status:  Discontinued        10 mg/kg  92.4 kg 136 mL/hr over 30 Minutes Intravenous Once per day on Mon Wed Fri 10/01/21 1350 10/12/21 1146   09/30/21 2000  DAPTOmycin (CUBICIN) 900 mg in sodium chloride 0.9 % IVPB  Status:  Discontinued        10 mg/kg  92.4 kg 136 mL/hr over 30 Minutes Intravenous Daily 09/28/21 0919 10/01/21 1350   09/28/21 2000  DAPTOmycin (CUBICIN) 900 mg in sodium chloride 0.9 % IVPB        900 mg 136 mL/hr over 30 Minutes Intravenous  Once 09/28/21 0919 09/28/21 2146   09/02/21 2000  DAPTOmycin (CUBICIN) 900 mg in sodium chloride 0.9 % IVPB  Status:  Discontinued        10 mg/kg  92.4 kg 136 mL/hr over 30 Minutes Intravenous Once per day on Mon Wed Fri 09/02/21 0836 09/28/21 0919   08/31/21 0745  ceFAZolin (ANCEF) IVPB 2g/100 mL premix        2 g 200 mL/hr over 30 Minutes Intravenous To Short Stay 08/31/21 0730 08/31/21 0918   08/30/21 0600  ceFAZolin (ANCEF) IVPB 2g/100 mL premix  Status:  Discontinued        2 g 200 mL/hr over 30 Minutes Intravenous To Short Stay 08/30/21 0123 08/31/21 0600   08/29/21 2000  DAPTOmycin (CUBICIN) 900 mg in sodium chloride 0.9 % IVPB  Status:  Discontinued        10 mg/kg  92.4 kg 136 mL/hr over 30 Minutes Intravenous Every 48 hours 08/29/21 1627 09/02/21 0836   08/28/21 1800  ceFAZolin (ANCEF) IVPB 2g/100 mL premix  Status:  Discontinued        2 g 200 mL/hr over 30 Minutes Intravenous Every M-W-F (1800) 08/27/21 0846 08/29/21 1620   08/28/21 1200  ceFAZolin (ANCEF) IVPB 1 g/50 mL premix  Status:   Discontinued        1 g 100 mL/hr over 30 Minutes Intravenous Every M-W-F (Hemodialysis) 08/26/21 1954 08/27/21 0846   08/28/21 0600  ceFAZolin (ANCEF) IVPB 2g/100 mL premix        2 g 200 mL/hr over 30 Minutes Intravenous On call to O.R. 08/27/21 1150 08/28/21 0909   08/27/21 1800  ceFAZolin (ANCEF) IVPB 1 g/50 mL premix        1 g 100 mL/hr over 30 Minutes Intravenous  Once 08/26/21 1954 08/27/21 2048   08/26/21 1500  ceFEPIme (MAXIPIME) 1 g in sodium chloride 0.9 % 100 mL IVPB  Status:  Discontinued        1 g 200 mL/hr over 30 Minutes Intravenous Every 24 hours 08/26/21 1452 08/26/21 1714        Subjective:  Seen on HD unit seems a little bit uncomfortable no chest pain no fever Tells me back is pending. This does not seem new  Objective: Vitals:   01/14/22 1200 01/14/22 1230 01/14/22 1253 01/14/22 1256  BP: (!) 167/68 (!) 143/61 (!) 157/60 (!) 152/49  Pulse: 66 68 (!) 129 68  Resp: 13 13 15 14   Temp:    98.2 F (36.8 C)  TempSrc:      SpO2: 100% 96%  100%  Weight:    95.9 kg  Height:        Intake/Output Summary (Last 24 hours) at 01/14/2022 1554 Last data filed at 01/14/2022 1256 Gross per 24 hour  Intake 480 ml  Output 4200 ml  Net -3720 ml    Filed Weights   01/10/22 2207 01/14/22 0810 01/14/22 1256  Weight: 93.5 kg 100 kg 95.9 kg    Examination:  Eomi ncat no focal deficit no issue Cta b no added sound no wheeze no rales no rhonchi Abd soft nt nd no rebound no guard Did not review lower extremities today Neurologically intact overall  Data Reviewed: I have personally reviewed following labs and imaging studies  CBC: Recent Labs  Lab 01/09/22 1124 01/10/22 0724 01/13/22 2022 01/14/22 0903  WBC 7.5 7.2 9.7 9.4  HGB 10.6* 10.4* 10.1* 9.4*  HCT 32.4* 31.5* 31.7* 28.8*  MCV 88.0 87.5 90.3 88.9  PLT 241 247 223 510    Basic Metabolic Panel: Recent Labs  Lab 01/09/22 1124 01/10/22 0724 01/13/22 2022 01/14/22 0902  NA 128* 125* 126*  126*  K 4.1 4.7 4.9 5.6*  CL 89* 87* 88* 88*  CO2 28 25 21* 22  GLUCOSE 181* 132* 164* 195*  BUN 39* 52* 85* 88*  CREATININE 4.69* 5.29* 6.60* 6.90*  CALCIUM 8.8* 9.1 8.6* 8.5*  PHOS 3.6 5.0* 5.9* 6.5*    GFR: Estimated Creatinine Clearance: 11.7 mL/min (A) (by C-G formula based on SCr of 6.9 mg/dL (H)). Liver Function Tests: Recent Labs  Lab 01/09/22 1124 01/10/22 0724 01/13/22 2022 01/14/22 0902  ALBUMIN 3.4* 3.4* 3.5 3.2*    No results for input(s): "LIPASE", "AMYLASE" in the last 168 hours. No results for input(s): "AMMONIA" in the last 168 hours. Coagulation Profile: No results for input(s): "INR", "PROTIME" in the last 168 hours. Cardiac Enzymes: No results for input(s): "CKTOTAL", "CKMB", "CKMBINDEX", "TROPONINI" in the last 168 hours. BNP (last 3 results) No results for input(s): "PROBNP" in the last 8760 hours. HbA1C: No results for input(s): "HGBA1C" in the last 72 hours. CBG: Recent Labs  Lab 01/13/22 1120 01/13/22 1636 01/13/22 2051 01/14/22 0713 01/14/22 1413  GLUCAP 171* 181* 142* 132* 182*    Lipid Profile: No results for input(s): "CHOL", "HDL", "LDLCALC", "TRIG", "CHOLHDL", "LDLDIRECT" in the last 72 hours. Thyroid Function Tests: No results for input(s): "TSH", "T4TOTAL", "FREET4", "T3FREE", "THYROIDAB" in the last 72 hours. Anemia Panel: No results for input(s): "VITAMINB12", "FOLATE", "FERRITIN", "TIBC", "IRON", "RETICCTPCT" in the last 72 hours. Sepsis Labs: No results for input(s): "PROCALCITON", "LATICACIDVEN" in the last 168 hours.  No results found for this or any previous visit (from the past 240 hour(s)).       Radiology Studies: No results found. Scheduled Meds:  (feeding supplement) PROSource Plus  30 mL Oral BID BM   alosetron  1 mg Oral BID   amitriptyline  25 mg Oral QHS   atorvastatin  40 mg Oral q1800   carvedilol  3.125 mg Oral BID   Chlorhexidine Gluconate Cloth  6 each Topical Q0600   darbepoetin (ARANESP)  injection - DIALYSIS  100 mcg Subcutaneous Q Fri-1800   docusate sodium  200 mg Oral BID   DULoxetine  60 mg Oral Daily   famotidine  20 mg Oral Daily   fentaNYL  1 patch Transdermal Q72H   furosemide  80 mg Oral Daily   heparin sodium (porcine)       heparin sodium (porcine)  2,000 Units Intravenous Once   hydrALAZINE  50 mg Oral TID   insulin aspart  0-9 Units Subcutaneous TID WC   insulin aspart  4 Units Subcutaneous TID WC   insulin glargine-yfgn  10 Units Subcutaneous QHS   multivitamin  1 tablet Oral QHS   pantoprazole  80 mg Oral Daily   polyethylene glycol  17 g Oral Daily   sevelamer carbonate  2,400 mg Oral TID WC   sodium zirconium cyclosilicate  10 g Oral Once per day on Sun Tue Thu Sat   Continuous Infusions:  albumin human      Nita Sells, MD [facility] 01/14/2022, 3:54 PM

## 2022-01-14 NOTE — Progress Notes (Signed)
[name] Progress Note   Subjective:  Seen in [place]. UF goal 4L. Refused HD yesterday so running off schedule today.   Objective Vitals:   01/14/22 0900 01/14/22 0930 01/14/22 1000 01/14/22 1030  BP:  (!) 156/69 (!) 176/66 (!) 189/67  Pulse: (!) 59 61 68 70  Resp: 15 16 13 13   Temp:      TempSrc:      SpO2: 100% 99% 97% 100%  Weight:      Height:       Physical Exam General: [name] Heart: RRR, no murmurs, rubs or gallops Lungs: CTA bilaterally, no wheezing, rhonchi or rales Abdomen: Soft, non-distended, +BS Extremities: trace edema L hip Dialysis Access:  AVF + bruit  Additional Objective Labs:  Blood Culture    Component Value Date/Time   SDES TISSUE 08/28/2021 0917   SPECREQUEST LEFT THIGH TISSUE 08/28/2021 0917   CULT  08/28/2021 0917    MODERATE ENTEROCOCCUS FAECIUM VANCOMYCIN RESISTANT ENTEROCOCCUS ISOLATED NO ANAEROBES ISOLATED Sent to [place] for further susceptibility testing. Performed at [facility], [place] [address], [place], [place] [address]    REPTSTATUS 09/02/2021 FINAL 08/28/2021 3419    Cardiac Enzymes: No results for input(s): "CKTOTAL", "CKMB", "CKMBINDEX", "TROPONINI" in the last 168 hours. CBG: Recent Labs  Lab 01/13/22 0733 01/13/22 1120 01/13/22 1636 01/13/22 2051 01/14/22 0713  GLUCAP 153* 171* 181* 142* 132*    Iron Studies: No results for input(s): "IRON", "TIBC", "TRANSFERRIN", "FERRITIN" in the last 72 hours. @lablastinr3 @ Studies/Results: No results found. Medications:  albumin human      (feeding supplement) PROSource Plus  30 mL Oral BID BM   alosetron  1 mg Oral BID   amitriptyline  25 mg Oral QHS   atorvastatin  40 mg Oral q1800   carvedilol  3.125 mg Oral BID   Chlorhexidine Gluconate Cloth  6 each Topical Q0600   darbepoetin (ARANESP) injection - DIALYSIS  100 mcg Subcutaneous Q Fri-1800   docusate sodium  200 mg Oral BID   DULoxetine  60 mg Oral Daily   famotidine  20 mg  Oral Daily   fentaNYL  1 patch Transdermal Q72H   furosemide  80 mg Oral Daily   heparin sodium (porcine)       heparin sodium (porcine)  2,000 Units Intravenous Once   hydrALAZINE  50 mg Oral TID   insulin aspart  0-9 Units Subcutaneous TID WC   insulin aspart  4 Units Subcutaneous TID WC   insulin glargine-yfgn  10 Units Subcutaneous QHS   multivitamin  1 tablet Oral QHS   pantoprazole  80 mg Oral Daily   polyethylene glycol  17 g Oral Daily   sevelamer carbonate  2,400 mg Oral TID WC   sodium zirconium cyclosilicate  10 g Oral Once per day on Sun Tue Thu Sat    Outpatient Dialysis Orders: MWF at [facility], 400/500, EDW 90kg, 2K/2Ca, LUE AVF - heparin 4000 unit IV bolus, no ESA    Assessment/Plan: Recurrent L hip osteomyelitis: surgery in June 2023  (removal hip IMN which was placed 12/2019) with persistent VRE infection. Completed 6 wk course of IV Daptomycin on 10/12/21. Per Dr. Jess Barters note 12/28/2021 "NO RESTRICTIONS L HIP".  Chronic L hip/residual limb pain: She does have fractures of L femoral neck (where hardware was removed) as well as distal femur Fx. Pain control per primary.  ESRD: Continue HD on usual MWF schedule. Clots HD system without heparin - using 4000  unit bolus and 2000 unit mid-run bolus.  Hyperkalemia: Stable on scheduled lokelma 4 days a week.  HTN/volume: BP meds have been tapered down - currently on Coreg 3.125mg  BID only.  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 Last tsat 54%. Continue Aranesp 187mcg weekly.  Secondary HPTH: CorrCa slightly high, not on VDRA. Phos variable, usually 5-6. Continue Renvela as binder. Nutrition: Alb low, continue protein supplements.  Renal diet w/fluid restrictions.  Rechecking labs today.  AVF dysfunction (resolved): S/p branch ligation 09/19/2021  Dr. Virl Cagey. AVF working well now. DM2: Insulin per primary Dispo: SNF previously  recommended, but she declined. Not home HD candidate.  Dialyzed in recliner on 9/4 but signed off early due to pain, has declined to come in a recliner since then.Strongly discussed on the importance of increasing her mobility as much as possible while she is in her room in addition to following her ongoing fluid restriction. Also reinforced the importance of being in the chair for HD treatments to improve strength and mobility. Our goal is to dialyze her in the mornings so the patient has more time to work with PT in the afternoons (we will do our best to accommodate this as we still need to always consider the emergent cases).  Encouraged ongoing progress. Per Renal Navigator notes there are no clinics that will accept patient unless she will have HD in chair. Will continue to encourage use of chair for HD  Lynnda Child PA-C [facility] 01/14/2022,11:02 AM

## 2022-01-14 NOTE — TOC Progression Note (Signed)
Transition of Care [facility]) - Initial/Assessment Note    Patient Details  Name: [name] MRN: [id number] Date of Birth: [birth year]  [facility] CM/SW Contact:    Milinda Antis, LCSWA Phone Number: 01/14/2022, 12:36 PM  Clinical Narrative:                 LCSW contacted admissions at [facility] to inquire about referral.  LCSW spoke with [place] who reported that the admissions coordinator is out of the office and will return tomorrow.  LCSW with follow up with the facility tomorrow.   Expected Discharge Plan: Skilled Nursing Facility Barriers to Discharge: Continued Medical Work up   Patient Goals and CMS Choice Patient states their goals for this hospitalization and ongoing recovery are:: To return home CMS Medicare.gov Compare Post Acute Care list provided to:: Patient Choice offered to / list presented to : NA  Expected Discharge Plan and Services Expected Discharge Plan: [place]   Discharge Planning Services: CM Consult   Living arrangements for the past 2 months: Single Family Home                                      Prior Living Arrangements/Services Living arrangements for the past 2 months: Single Family Home Lives with:: Adult Children Patient language and need for interpreter reviewed:: Yes Do you feel safe going back to the place where you live?: Yes      Need for Family Participation in Patient Care: Yes (Comment) Care giver support system in place?: Yes (comment) Current home services: DME ([employer], w/c) Criminal Activity/Legal Involvement Pertinent to Current Situation/Hospitalization: No - Comment as needed  Activities of Daily Living Home Assistive Devices/Equipment: Eyeglasses, Wheelchair ADL Screening (condition at time of admission) Patient's cognitive ability adequate to safely complete daily activities?: Yes Is the patient deaf or have difficulty hearing?: No Does the patient have difficulty seeing,  even when wearing glasses/contacts?: No Does the patient have difficulty concentrating, remembering, or making decisions?: No Patient able to express need for assistance with ADLs?: Yes Does the patient have difficulty dressing or bathing?: Yes Independently performs ADLs?: No Communication: Independent Dressing (OT): Independent Is this a change from baseline?: Pre-admission baseline Grooming: Independent Feeding: Independent Bathing: Needs assistance Is this a change from baseline?: Pre-admission baseline Toileting: Needs assistance Is this a change from baseline?: Pre-admission baseline In/Out Bed: Needs assistance, Dependent Is this a change from baseline?: Pre-admission baseline Walks in Home: Dependent Is this a change from baseline?: Pre-admission baseline Does the patient have difficulty walking or climbing stairs?: Yes Weakness of Legs: Both Weakness of Arms/Hands: Both  Permission Sought/Granted Permission sought to share information with : Case Manager, Family Supports Permission granted to share information with : Yes, Verbal Permission Granted              Emotional Assessment Appearance:: Appears stated age Attitude/Demeanor/Rapport: Engaged, Gracious Affect (typically observed): Accepting, Appropriate, Calm, Hopeful Orientation: : Oriented to Self, Oriented to Place, Oriented to  Time, Oriented to Situation Alcohol / Substance Use: Not Applicable Psych Involvement: No (comment)  Admission diagnosis:  Wound infection [T14.8XXA, L08.9] Abscess of left hip [L02.416] Patient Active Problem List   Diagnosis Date Noted   Wound dehiscence, surgical, sequela    Abscess of left hip 08/26/2021   History of CVA (cerebrovascular accident) 50/11/3816   Hardware complicating wound infection ([place])    Subacute osteomyelitis  of left femur with abscess     Septic arthritis of hip ([place]) 07/30/2021   Skin abscess L hip 07/28/2021   HCAP (healthcare-associated pneumonia)  07/27/2021   Hyperkalemia 26/83/4196   Metabolic acidemia 22/29/7989   Closed fracture of left distal femur ([place]) 07/22/2021   Type 2 diabetes mellitus with hyperlipidemia ([place]) 07/22/2021   Infection of superficial incisional surgical site after procedure 03/09/2020   Abscess    Hypoglycemia    Essential hypertension    Sleep disturbance    ESRD (end stage renal disease) on dialysis ([place]) 01/24/2020   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[place]) 01/23/2020   Hyponatremia    Constipation    Chronic osteomyelitis involving left ankle and foot ([place])    Ulcer of left foot with necrosis of bone ([place])    Wound infection 12/28/2019   History of [place] amputation of left foot ([place]) 12/25/2017   History of CVA (cerebrovascular accident) without residual deficits 07/04/2017   Cerebral thrombosis with cerebral infarction 04/08/2017   Right sided weakness 04/07/2017   Hyperhidrosis 09/01/2016   Migraine with aura and without status migrainosus, not intractable 07/28/2016   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[place]) 08/04/2014   CKD stage 3 due to type 2 diabetes mellitus ([place]) 06/27/2014   Vitamin D insufficiency 05/08/2014   Obesity (BMI 30.0-34.9) 05/08/2014   Unilateral amputation of left foot ([place]) 05/08/2014   Bursitis of left shoulder 02/14/2014   Neck pain 01/03/2014   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 [place] [address] Phone: [phone] Fax: [phone]  [id number] - [place], [place] [address] [place] [address] Phone: [phone] Fax: [phone]     Social Determinants of Health (SDOH) Interventions    Readmission Risk Interventions    07/26/2021    3:15 PM  Readmission Risk Prevention Plan  Transportation Screening Complete  PCP or Specialist Appt within 3-5 Days Complete  HRI or Home Care Consult Complete  Palliative Care Screening Not Applicable  Medication Review (RN Care Manager) Referral to Pharmacy

## 2022-01-14 NOTE — Progress Notes (Signed)
Physical Therapy Treatment Patient Details Name: [name] MRN: [id number] DOB: February 11, 1966 Today's Date: 01/14/2022   History of Present Illness 56 y.o. female presented to ED 6/26 from dialysis with increased bloody drainage from L hip wound. 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 s/p hardware removal of left hip with partial resection of tuft tissue and femure that were nonviable on 08/10/21 Discharged home 6/23. underwent L hip debridement and wound vac placement 6/28; +Left intertrochanteric non-union,  Fracture of left distal femur  PMH: hypertension, hyperlipidemia, ESRD on HD MWF, history of left BKA in 2021, depression/anxiety, stroke, tobacco use, T2DM,  insomnia, chronic pain syndrome,    PT Comments    Continuing work on functional mobility and activity tolerance;  Pt seen just after getting back form HD, and declining physical work on transfers; Session focused on discussion re: progression to more normal mobilty and daily activities; at this point, that will look like using a wheelchair for mobility, and we will work on [name] feeling stable and secure with scoot transfers to multiple options like wheelchair, drop-arm BSC, drop-arm recliner; As she improves at [facility] level, we will gently reintroduce work on standing, and stand pivot transfers;   This PT demonstrated lateral scoot transfer on sliding board from [facility] to drop-arm [facility], and solicited questions;   Her current prosthesis has a poor fit, but it's possible to use it as a bigger pivot point for lateral scooting, and for a better lever arm for more effective stretching; Will need a proper fit to be able to use her prosthesis for standing; [facility], and they will be able to send a prosthetist to evaluate the prosthesis fit tomorrow, 11/15; PT plans to find out HD schedule as early as possible tomorrow, then contact [facility] to arrange for a time to meet in  [name]'s room; If she is to have HD tomorrow to get back on schedule, request that it be done as early as possible, or late enough (second round?) to allow for prosthesis eval prior to going to HD; Attempted to call pt in her room to inform her of potential prosthetist visit tomorrow, but call did not go through.    Recommendations for follow up therapy are one component of a multi-disciplinary discharge planning process, led by the attending physician.  Recommendations may be updated based on patient status, additional functional criteria and insurance authorization.  Follow Up Recommendations  Skilled nursing-short term rehab (<3 hours/day) (pt currently refusing due to inability to tolerate sitting in chair for 6+ hours as transported to/from dialysis) Can patient physically be transported by private vehicle: No   Assistance Recommended at Discharge Intermittent Supervision/Assistance  Patient can return home with the following A lot of help with bathing/dressing/bathroom;Assistance with cooking/housework;Assist for transportation;Two people to help with walking and/or transfers;Help with stairs or ramp for entrance   Equipment Recommendations  Other (comment) (Sliding board, drop-arm BSC)    Recommendations for Other Services OT consult     Precautions / Restrictions Precautions Precautions: Fall;Other (comment) Precaution Comments: L BKA (baseline) Restrictions LLE Weight Bearing: Weight bearing as tolerated Other Position/Activity Restrictions: L distal femur fx; per Dr [name] 8/10 pt can full WB     Mobility  Bed Mobility                    Transfers                   General transfer comment:  Demonstrated sliding board transfer from pt's wheelchair to drop-arm [facility], and discussed plans to work on [facility] transfers; Discussed clothing manipulation as well; will consider re-ordering OT    Ambulation/Gait                   Stairs              Wheelchair Mobility    Modified Rankin (Stroke Patients Only)       Balance                                            Cognition Arousal/Alertness: Awake/alert Behavior During Therapy: WFL for tasks assessed/performed Overall Cognitive Status: Within Functional Limits for tasks assessed                                          Exercises Other Exercises Other Exercises: Positioned bolster at lateral L knee/thigh and medial residual limb/calf for gentle stretch    General Comments General comments (skin integrity, edema, etc.): Discussed progression of functional mobility and ADLs; Moved position of bed (closer to the door) to allow for more room to move in wheelchair and have access to the bathroom.      Pertinent Vitals/Pain Pain Assessment Pain Assessment: Faces Faces Pain Scale: Hurts a little bit Pain Location: General discomfort and hungry post HD Pain Descriptors / Indicators: Discomfort, Grimacing, Guarding Pain Intervention(s): RN gave pain meds during session    Home Living                          Prior Function            PT Goals (current goals can now be found in the care plan section) Acute Rehab PT Goals Patient Stated Goal: less pain with movement PT Goal Formulation: With patient Time For Goal Achievement: 01/23/22 Potential to Achieve Goals: Fair Progress towards PT goals: Progressing toward goals (slowly)    Frequency    Min 2X/week      PT Plan Current plan remains appropriate    Co-evaluation              AM-PAC PT "6 Clicks" Mobility   Outcome Measure  Help needed turning from your back to your side while in a flat bed without using bedrails?: None Help needed moving from lying on your back to sitting on the side of a flat bed without using bedrails?: A Little Help needed moving to and from a bed to a chair (including a wheelchair)?: A Little Help needed standing up from a  chair using your arms (e.g., wheelchair or bedside chair)?: Total Help needed to walk in hospital room?: Total Help needed climbing 3-5 steps with a railing? : Total 6 Click Score: 13    End of Session Equipment Utilized During Treatment: Other (comment) (bolster, drop-arm BSC for demonstration) Activity Tolerance: Other (comment) (Limited activity tolerance after HD, and declining physical work on transfers) Patient left: in bed;with call bell/phone within reach Nurse Communication: Mobility status PT Visit Diagnosis: Other abnormalities of gait and mobility (R26.89);Muscle weakness (generalized) (M62.81);History of falling (Z91.81);Pain Pain - Right/Left: Left Pain - part of body: Leg     Time: 1357-1444 PT Time Calculation (min) (ACUTE ONLY): 47 min  Charges:  $Therapeutic Activity: 8-22 mins $Self Care/Home Management: [place] Office [place] 01/14/2022, 6:35 PM

## 2022-01-14 NOTE — Plan of Care (Signed)
  Problem: Activity: Goal: Risk for activity intolerance will decrease Outcome: Progressing   Problem: Pain Managment: Goal: General experience of comfort will improve Outcome: Progressing   Problem: Education: Goal: Individualized Educational Video(s) Outcome: Progressing   Problem: Fluid Volume: Goal: Compliance with measures to maintain balanced fluid volume will improve Outcome: Progressing   Problem: Health Behavior/Discharge Planning: Goal: Ability to manage health-related needs will improve Outcome: Progressing   Problem: Nutritional: Goal: Ability to make healthy dietary choices will improve Outcome: Progressing   Problem: Clinical Measurements: Goal: Complications related to the disease process, condition or treatment will be avoided or minimized Outcome: Progressing

## 2022-01-15 DIAGNOSIS — E669 Obesity, unspecified: Secondary | ICD-10-CM

## 2022-01-15 DIAGNOSIS — Z8673 Personal history of transient ischemic attack (TIA), and cerebral infarction without residual deficits: Secondary | ICD-10-CM | POA: Diagnosis not present

## 2022-01-15 DIAGNOSIS — M86252 Subacute osteomyelitis, left femur: Secondary | ICD-10-CM | POA: Diagnosis not present

## 2022-01-15 DIAGNOSIS — T148XXA Other injury of unspecified body region, initial encounter: Secondary | ICD-10-CM | POA: Diagnosis not present

## 2022-01-15 DIAGNOSIS — N186 End stage renal disease: Secondary | ICD-10-CM | POA: Diagnosis not present

## 2022-01-15 LAB — CBC
HCT: 32 % — ABNORMAL LOW (ref 36.0–46.0)
Hemoglobin: 10.4 g/dL — ABNORMAL LOW (ref 12.0–15.0)
MCH: 29.1 pg (ref 26.0–34.0)
MCHC: 32.5 g/dL (ref 30.0–36.0)
MCV: 89.4 fL (ref 80.0–100.0)
Platelets: 216 10*3/uL (ref 150–400)
RBC: 3.58 MIL/uL — ABNORMAL LOW (ref 3.87–5.11)
RDW: 17.8 % — ABNORMAL HIGH (ref 11.5–15.5)
WBC: 7.6 10*3/uL (ref 4.0–10.5)
nRBC: 0 % (ref 0.0–0.2)

## 2022-01-15 LAB — RENAL FUNCTION PANEL
Albumin: 3.4 g/dL — ABNORMAL LOW (ref 3.5–5.0)
Anion gap: 11 (ref 5–15)
BUN: 34 mg/dL — ABNORMAL HIGH (ref 6–20)
CO2: 26 mmol/L (ref 22–32)
Calcium: 8.5 mg/dL — ABNORMAL LOW (ref 8.9–10.3)
Chloride: 95 mmol/L — ABNORMAL LOW (ref 98–111)
Creatinine, Ser: 3.48 mg/dL — ABNORMAL HIGH (ref 0.44–1.00)
GFR, Estimated: 15 mL/min — ABNORMAL LOW (ref >60)
Glucose, Bld: 157 mg/dL — ABNORMAL HIGH (ref 70–99)
Phosphorus: 3.5 mg/dL (ref 2.5–4.6)
Potassium: 4.2 mmol/L (ref 3.5–5.1)
Sodium: 132 mmol/L — ABNORMAL LOW (ref 135–145)

## 2022-01-15 LAB — GLUCOSE, CAPILLARY
Glucose-Capillary: 163 mg/dL — ABNORMAL HIGH (ref 70–99)
Glucose-Capillary: 204 mg/dL — ABNORMAL HIGH (ref 70–99)
Glucose-Capillary: 280 mg/dL — ABNORMAL HIGH (ref 70–99)

## 2022-01-15 MED ORDER — HEPARIN SODIUM (PORCINE) 1000 UNIT/ML IJ SOLN
INTRAMUSCULAR | Status: AC
  Administered 2022-01-15: 4000 [IU]
  Filled 2022-01-15: qty 5

## 2022-01-15 NOTE — Progress Notes (Signed)
Mobility Specialist Progress Note:   01/15/22 1610  Mobility  Activity Turned to right side;Turned to left side (bed level exercises + stretches)  Level of Assistance Minimal assist, patient does 75% or more  Assistive Device None  Range of Motion/Exercises Active;All extremities  Activity Response Tolerated fair  Mobility Referral Yes  $Mobility charge 1 Mobility   Pt agreeable to daily exercises + stretches. Tolerated well with mild back and LLE pain. Pt turned in bed to replace pad. Left with all needs met.   Nelta Numbers Mobility Specialist Please contact via SecureChat or  Rehab office at [phone]

## 2022-01-15 NOTE — Progress Notes (Signed)
PROGRESS NOTE    [name]  FTD:[id number] DOB: 1965-12-27 DOA: 08/26/2021 PCP: [name], MD    Brief Narrative:  Patient is 56 year old female with history of hypertension, hyperlipidemia, diabetes mellitus type 2, end-stage renal disease on hemodialysis, status post left BKA for chronic calcaneus osteomyelitis on 01/12/2020, bipolar disorder, left distal femoral fracture after a fall on 07/19/2021 deemed  nonoperative, admission from 08/03/2021-08/23/2021 for left femoral osteomyelitis requiring partial resection of femur and rearrangements of bony fragments with subsequent antibiotic treatment presented to the hospital with left hip bleeding and was found to have subacute osteomyelitis and abscess of left femur.  Orthopedic/ID were consulted.  She was started on broad-spectrum antibiotics.  Underwent multiple I&D's by Dr. [name] along with local tissue rearrangement.  She was also seen by vascular surgery who did  fistula branch ligation.  She has completed 6-week course of daptomycin on 10/09/2021.  She is unable to complete hemodialysis in chair and outpatient hemodialysis unit has been hard to find.  She is otherwise medically stable for discharge.   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  Active Problems:   ESRD (end stage renal disease) on dialysis ([place])   Type 2 diabetes mellitus with hyperlipidemia (HCC)   History of CVA (cerebrovascular accident)   Essential hypertension   Anxiety and depression   GASTROESOPHAGEAL REFLUX, NO ESOPHAGITIS   TOBACCO DEPENDENCE   Hyponatremia   Obesity (BMI 30.0-34.9)   Unilateral amputation of left foot (HCC)   Wound infection   History of CVA (cerebrovascular accident) without residual deficits   Hyperkalemia   Wound dehiscence, surgical, sequela   Subacute osteomyelitis of left femur with abscess -Status post surgical intervention by Dr. [name].  Completed a 6-week course of daptomycin on  10/09/2021.  Continue muscle relaxer and oxycodone.  Of note patient had reimaging on 12/02/2021 of left knee showed healing fracture with callus.  Orthopedics Dr [name] followed and is doing well.  No left hip restrictions at this time.    Right thigh mild cellulitis Improved, received a course of antibiotic in the past.  Likely from anasarca.    History of left intertrochanteric fracture Left hip hardware removed on 08/10/2021 prior to admission.  Physical therapy working to scoot and mobilize with wheelchair.  Will need at least 4 hours to be able to be considered for outpatient hemodialysis.  End-stage renal disease on hemodialysis Anasarca -Nephrology following.  Continue dialysis as per nephrology.  Continue Lasix.  Strict intake and output charting Daily weights.  Fluid restriction.  On intermittent Lokelma for hyperkalemia.  Currently sitting for around 2 hours in chair.  Hyponatremia Latest sodium of 132.  Managed with hemodialysis.   Hyperphosphatemia--continue sevelamer.  Anemia of chronic disease Latest hemoglobin on 01/14/2022 at 9.4.  Continue to monitor.  Diabetes mellitus type 2 with hyperglycemia Continue sliding scale insulin mealtime insulin and long-acting insulin.  Continue to monitor closely.   Essential hypertension Continue Coreg, hydralazine, Lasix 80 qd    History of unspecified CVA Hyperlipidemia Continue Lipitor.   GERD -Continue Protonix and Pepcid   Tobacco use Stable at this time.   Anxiety and depression Continue amitriptyline and Cymbalta.    DVT prophylaxis: Place TED hose Start: 09/06/21 1507 SCDs Start: 08/31/21 1058   Code Status:     Code Status: Full Code  Disposition: Uncertain at this time.  Status is: Inpatient  Remains inpatient appropriate because: Pending disposition plan.  Long length of stay  patient.   Family Communication: None at bedside.  Consultants:  Nephrology Vascular surgery Orthopedics Infectious  disease  Procedures:  I&D's with tissue arrangement. Fistula branch ligation. Hemodialysis  Antimicrobials:  None currently   Subjective: Today, patient was seen and examined at bedside.  Patient states that her pain has not been controlled well over the last couple of weeks.  Wants to eat a yogurt that she likes.  Objective: Vitals:   01/15/22 0746 01/15/22 0800 01/15/22 0830 01/15/22 0900  BP: (!) 158/59 (!) 151/60 (!) 174/66 (!) 149/70  Pulse: 65 65 70 73  Resp: 13 14 16 14   Temp:      TempSrc:      SpO2: 100% 99% 97% 93%  Weight:      Height:        Intake/Output Summary (Last 24 hours) at 01/15/2022 0942 Last data filed at 01/15/2022 0600 Gross per 24 hour  Intake 360 ml  Output 4600 ml  Net -4240 ml   Filed Weights   01/14/22 0810 01/14/22 1256 01/15/22 0730  Weight: 100 kg 95.9 kg 97 kg    Physical Examination: Body mass index is 29.42 kg/m.  General:  Average built, not in obvious distress HENT:   No scleral pallor or icterus noted. Oral mucosa is moist.  Chest:  Clear breath sounds.   No crackles or wheezes.  CVS: S1 &S2 heard. No murmur.  Regular rate and rhythm. Abdomen: Soft, nontender, nondistended.  Bowel sounds are heard.   Extremities: Status post left below-knee amputation with stump covered, right lower extremity with edema.  Left upper extremity AV fistula. Psych: Alert, awake and oriented, normal mood CNS:  No cranial nerve deficits.  Power equal in all extremities.   Skin: Warm and dry.  No rashes noted.  Data Reviewed:   CBC: Recent Labs  Lab 01/09/22 1124 01/10/22 0724 01/13/22 2022 01/14/22 0903  WBC 7.5 7.2 9.7 9.4  HGB 10.6* 10.4* 10.1* 9.4*  HCT 32.4* 31.5* 31.7* 28.8*  MCV 88.0 87.5 90.3 88.9  PLT 241 247 223 403    Basic Metabolic Panel: Recent Labs  Lab 01/09/22 1124 01/10/22 0724 01/13/22 2022 01/14/22 0902  NA 128* 125* 126* 126*  K 4.1 4.7 4.9 5.6*  CL 89* 87* 88* 88*  CO2 28 25 21* 22  GLUCOSE 181* 132*  164* 195*  BUN 39* 52* 85* 88*  CREATININE 4.69* 5.29* 6.60* 6.90*  CALCIUM 8.8* 9.1 8.6* 8.5*  PHOS 3.6 5.0* 5.9* 6.5*    Liver Function Tests: Recent Labs  Lab 01/09/22 1124 01/10/22 0724 01/13/22 2022 01/14/22 0902  ALBUMIN 3.4* 3.4* 3.5 3.2*     Radiology Studies: No results found.    LOS: 142 days    Flora Lipps, MD [facility] Available via Epic secure chat 7am-7pm After these hours, please refer to coverage provider listed on [email or web address] 01/15/2022, 9:42 AM

## 2022-01-15 NOTE — Progress Notes (Signed)
Physical Therapy Treatment Patient Details Name: [name] MRN: [id number] DOB: [birth year] Today's Date: 01/15/2022   History of Present Illness 56 y.o. female presented to ED 6/26 from dialysis with increased bloody drainage from L hip wound. 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 s/p hardware removal of left hip with partial resection of tuft tissue and femure that were nonviable on 08/10/21 Discharged home 6/23. underwent L hip debridement and wound vac placement 6/28; +Left intertrochanteric non-union,  Fracture of left distal femur  PMH: hypertension, hyperlipidemia, ESRD on HD MWF, history of left BKA in 2021, depression/anxiety, stroke, tobacco use, T2DM,  insomnia, chronic pain syndrome,    PT Comments    Continuing work on functional mobility and activity tolerance;  Session focused on meeting up with [name], [facility], for a prosthesis fit check; [name] made a few adjustments to her prosthesis (that he could do outside of his office/shop), and we were able to get a solid fit with the ability to "click in"; Being able to trust the stability of her prosthesis will give [name] the ability to use 2 feet for more stable pivot points with lateral scooting, and will give her the opportunity for 2 feet with initial squatting/standing/weight bearing trials, the  longer lever arm will make stretching more effective; Her hip and knee ROM deficits will make getting foot flat difficult; Discussed adjusting the prosthesis to accommodate, but ultimately would like to keep the work of stretching going before making accommodations;  [name] recommends that [name] wear her liner during the day, and wearing her shrinker at night  Discussed working on [facility] transfers in the next few sessions;    Recommendations for follow up therapy are one component of a multi-disciplinary discharge planning process, led by the attending physician.  Recommendations may be updated based on  patient status, additional functional criteria and insurance authorization.  Follow Up Recommendations  Skilled nursing-short term rehab (<3 hours/day) Can patient physically be transported by private vehicle: No   Assistance Recommended at Discharge Intermittent Supervision/Assistance  Patient can return home with the following A lot of help with bathing/dressing/bathroom;Assistance with cooking/housework;Assist for transportation;Two people to help with walking and/or transfers;Help with stairs or ramp for entrance   Equipment Recommendations  Other (comment) (Sliding board, drop-arm BSC; needs a wheelchair cushion)    Recommendations for Other Services       Precautions / Restrictions Precautions Precautions: Fall;Other (comment) Precaution Comments: L BKA (baseline) Restrictions LLE Weight Bearing: Weight bearing as tolerated Other Position/Activity Restrictions: L distal femur fx; per Dr [name] 8/10 pt can full WB; Prosthesis fits     Mobility  Bed Mobility                    Transfers                        Ambulation/Gait                   Stairs             Wheelchair Mobility    Modified Rankin (Stroke Patients Only)       Balance                                            Cognition Arousal/Alertness: Awake/alert Behavior During Therapy: WFL for tasks  assessed/performed Overall Cognitive Status: Within Functional Limits for tasks assessed                                          Exercises      General Comments General comments (skin integrity, edema, etc.): We discussed scheduling to allow for more flexibility with PT session timing; Nephrology currently trying to keep HD on the early round to allow for PT in the afternoon, however, pt tends to decline PT after HD; pt and PT wonder if she can work with PT earlier, and go to HD later on one of her HD days, perhaps Monday?  Will reach out  to Nephrology re: perhaps PT before HD on Mondays?      Pertinent Vitals/Pain Pain Assessment Pain Assessment: Faces Faces Pain Scale: Hurts little more Pain Location: General discomfort and hungry post HD Pain Descriptors / Indicators: Discomfort, Grimacing, Guarding Pain Intervention(s): Premedicated before session    Home Living                          Prior Function            PT Goals (current goals can now be found in the care plan section) Acute Rehab PT Goals Patient Stated Goal: less pain with movement; wants to walk again PT Goal Formulation: With patient Time For Goal Achievement: 01/23/22 Potential to Achieve Goals: Fair Progress towards PT goals: Progressing toward goals (very slowly; but prosthesis fit check done)    Frequency    Min 2X/week      PT Plan Current plan remains appropriate    Co-evaluation              AM-PAC PT "6 Clicks" Mobility   Outcome Measure  Help needed turning from your back to your side while in a flat bed without using bedrails?: None Help needed moving from lying on your back to sitting on the side of a flat bed without using bedrails?: A Little Help needed moving to and from a bed to a chair (including a wheelchair)?: A Little Help needed standing up from a chair using your arms (e.g., wheelchair or bedside chair)?: Total Help needed to walk in hospital room?: Total Help needed climbing 3-5 steps with a railing? : Total 6 Click Score: 13    End of Session Equipment Utilized During Treatment: Other (comment) (Prosthesis) Activity Tolerance: Other (comment) (Declining mobility post HD) Patient left: in bed;with call bell/phone within reach Nurse Communication: Mobility status (and prosthesis fits!) PT Visit Diagnosis: Other abnormalities of gait and mobility (R26.89);Muscle weakness (generalized) (M62.81);History of falling (Z91.81);Pain Pain - Right/Left: Left Pain - part of body: Leg     Time:  5284-1324 PT Time Calculation (min) (ACUTE ONLY): 38 min  Charges:  $Therapeutic Activity: 8-22 mins $Orthotics/Prosthetics Check: 23-37 mins                     [name], PT  Acute Rehabilitation Services Office [phone]    [name] 01/15/2022, 5:36 PM

## 2022-01-15 NOTE — Progress Notes (Signed)
Received patient in bed to unit.  Alert and oriented.  Informed consent signed and in chart.   Treatment initiated: [place] Treatment completed: 1150  Patient tolerated well.  Transported back to the room  Alert, without acute distress.  Hand-off given to patient's nurse.   Access used: AVF Access issues: None  Total UF removed: 4L Medication(s) given: none Post HD VS: 135/92,73,24,98.399% Post HD weight: 93.0kg   [facility]

## 2022-01-16 ENCOUNTER — Inpatient Hospital Stay (HOSPITAL_COMMUNITY): Payer: Medicare Other

## 2022-01-16 DIAGNOSIS — Z8673 Personal history of transient ischemic attack (TIA), and cerebral infarction without residual deficits: Secondary | ICD-10-CM | POA: Diagnosis not present

## 2022-01-16 DIAGNOSIS — M86252 Subacute osteomyelitis, left femur: Secondary | ICD-10-CM | POA: Diagnosis not present

## 2022-01-16 DIAGNOSIS — N186 End stage renal disease: Secondary | ICD-10-CM | POA: Diagnosis not present

## 2022-01-16 DIAGNOSIS — T148XXA Other injury of unspecified body region, initial encounter: Secondary | ICD-10-CM | POA: Diagnosis not present

## 2022-01-16 LAB — GLUCOSE, CAPILLARY
Glucose-Capillary: 207 mg/dL — ABNORMAL HIGH (ref 70–99)
Glucose-Capillary: 186 mg/dL — ABNORMAL HIGH (ref 70–99)
Glucose-Capillary: 177 mg/dL — ABNORMAL HIGH (ref 70–99)
Glucose-Capillary: 152 mg/dL — ABNORMAL HIGH (ref 70–99)

## 2022-01-16 MED ORDER — AMITRIPTYLINE HCL 50 MG PO TABS
50.0000 mg | ORAL_TABLET | Freq: Every day | ORAL | Status: DC
  Administered 2022-01-16 – 2022-03-17 (×61): 50 mg via ORAL
  Filled 2022-01-16 (×61): qty 1

## 2022-01-16 MED ORDER — METHOCARBAMOL 500 MG PO TABS
500.0000 mg | ORAL_TABLET | Freq: Four times a day (QID) | ORAL | Status: DC
  Administered 2022-01-16 – 2022-03-18 (×219): 500 mg via ORAL
  Filled 2022-01-16 (×224): qty 1

## 2022-01-16 MED ORDER — CHLORHEXIDINE GLUCONATE CLOTH 2 % EX PADS: 6.0000 | MEDICATED_PAD | Freq: Every day | CUTANEOUS | Status: DC

## 2022-01-16 NOTE — Inpatient Diabetes Management (Signed)
Inpatient Diabetes Program Recommendations  AACE/ADA: New Consensus Statement on Inpatient Glycemic Control (2015)  Target Ranges:  Prepandial:   less than 140 mg/dL      Peak postprandial:   less than 180 mg/dL (1-2 hours)      Critically ill patients:  140 - 180 mg/dL   Lab Results  Component Value Date   GLUCAP 207 (H) 01/16/2022   HGBA1C 5.9 (H) 11/14/2021    Review of Glycemic Control  Latest Reference Range & Units 01/15/22 12:20 01/15/22 16:29 01/15/22 22:00 01/16/22 07:19  Glucose-Capillary 70 - 99 mg/dL 163 (H)  Novolog 6 units 204 (H)  Novolog 7 units 280 (H)     Semglee 10 units 207 (H)  Novolog 7 units   Diabetes history: DM 2 Outpatient Diabetes medications: Lantus 10 units, Novolog 10 units tid Current orders for Inpatient glycemic control:  Semglee 10 units qhs Novolog 0-9 units tid Novolog 4 units tid meal coverage  A1c 5.9% on 9/14  Inpatient Diabetes Program Recommendations:    - Increase Novolog meal coverage to 5 units  Thanks,  Tama Headings RN, MSN, BC-ADM Inpatient Diabetes Coordinator Team Pager [phone] (8a-5p)

## 2022-01-16 NOTE — Progress Notes (Addendum)
PROGRESS NOTE    [name]  ZJQ:[id number] DOB: [birth year] DOA: 08/26/2021 PCP: [name], MD    Brief Narrative:   Patient is 56 year old female with history of hypertension, hyperlipidemia, diabetes mellitus type 2, end-stage renal disease on hemodialysis, status post left BKA for chronic calcaneus osteomyelitis on 01/12/2020, bipolar disorder, left distal femoral fracture after a fall on 07/19/2021 deemed  nonoperative, admission from 08/03/2021-08/23/2021 for left femoral osteomyelitis requiring partial resection of femur and rearrangements of bony fragments with subsequent antibiotic treatment presented to the hospital with left hip bleeding and was found to have subacute osteomyelitis and abscess of left femur.  Orthopedic/ID were consulted and patient was started on broad-spectrum antibiotics.  Underwent multiple I&D's by Dr. [name] along with local tissue rearrangement.  She was also seen by vascular surgery who did  fistula branch ligation.  She has completed 6-week course of daptomycin on 10/09/2021.  She is unable to complete hemodialysis in chair and outpatient hemodialysis unit has been hard to find.  She is otherwise medically stable for discharge.   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  Active Problems:   ESRD (end stage renal disease) on dialysis ([place])   Type 2 diabetes mellitus with hyperlipidemia (HCC)   History of CVA (cerebrovascular accident)   Essential hypertension   Anxiety and depression   GASTROESOPHAGEAL REFLUX, NO ESOPHAGITIS   TOBACCO DEPENDENCE   Hyponatremia   Obesity (BMI 30.0-34.9)   Unilateral amputation of left foot (HCC)   Wound infection   History of CVA (cerebrovascular accident) without residual deficits   Hyperkalemia   Wound dehiscence, surgical, sequela   Subacute osteomyelitis of left femur with abscess -Status post surgical intervention by Dr. [name].  Completed a 6-week course of daptomycin  on 10/09/2021.  Continue muscle relaxer and oxycodone.  Re imaging on 12/02/2021 of left knee showed healing fracture with callus.  Orthopedics Dr [name] followed and is doing well.  No left hip restrictions at this time.    Chronic pain.  Patient is status in a patient with current medication.  Already on fentanyl patch and oral oxycodone every 4 hourly.  On muscle relaxant as well.  On increased dose of amitriptyline to 50 mg at night.  Continue muscle relaxant every 6 around-the-clock.  Anterior chest wall pain.  Abdominal x-ray was negative yesterday.  Appears to be costochondritis related.  We will add a Lidoderm patch, warm compress.  Continue muscle relaxant and oxycodone.  Right thigh mild cellulitis Improved, received a course of antibiotic in the past.  Likely from anasarca.    History of left intertrochanteric fracture Left hip hardware removed on 08/10/2021 prior to admission.  Physical therapy working to scoot and mobilize with wheelchair.  Will need at least 4 hours to be able to be considered for outpatient hemodialysis.  End-stage renal disease on hemodialysis Anasarca -Nephrology following.  Continue dialysis as per nephrology.   Strict intake and output charting Daily weights.  Fluid restriction.  On intermittent Lokelma for hyperkalemia.  Currently able to sit for around 2 hours in hemodialysis chair.  Hyponatremia Latest sodium of 132.  Managed with hemodialysis.   Hyperphosphatemia--continue sevelamer.  Anemia of chronic disease Latest hemoglobin  at 10.4.  Continue to monitor.  Diabetes mellitus type 2 with hyperglycemia Continue sliding scale insulin mealtime insulin and long-acting insulin.  Continue to monitor closely.   Essential hypertension Continue Coreg, hydralazine, Lasix    History of unspecified CVA Hyperlipidemia  Continue Lipitor.   GERD -Continue Protonix and Pepcid   Tobacco use Stable at this time.   Anxiety and depression Continue amitriptyline  and Cymbalta.    DVT prophylaxis: Place TED hose Start: 09/06/21 1507 SCDs Start: 08/31/21 1058   Code Status:     Code Status: Full Code  Disposition: Uncertain at this time.  Status is: Inpatient  Remains inpatient appropriate because: Pending disposition plan.  Long length of stay patient.   Family Communication: None at bedside.  Consultants:  Nephrology Vascular surgery Orthopedics Infectious disease  Procedures:  I&D's with tissue arrangement. Fistula branch ligation. Hemodialysis  Antimicrobials:  None currently   Subjective: Today, she was seen and examined at bedside.  Complains of chest wall pain.  No nausea vomiting fever chills or rigor.   Objective: Vitals:   01/15/22 1224 01/15/22 1632 01/15/22 2201 01/16/22 0514  BP: (!) 125/49 (!) 136/48 (!) 133/55 (!) 138/54  Pulse: 73 66 67 62  Resp: 18  18 18   Temp: 98.5 F (36.9 C) 98.2 F (36.8 C) 98.5 F (36.9 C) 98 F (36.7 C)  TempSrc: Oral Oral Oral Oral  SpO2: 100% 96% 99% 98%  Weight:    93 kg  Height:        Intake/Output Summary (Last 24 hours) at 01/16/2022 0746 Last data filed at 01/16/2022 0600 Gross per 24 hour  Intake 1040 ml  Output 4001 ml  Net -2961 ml    Filed Weights   01/15/22 0730 01/15/22 1153 01/16/22 0514  Weight: 97 kg 93 kg 93 kg    Physical Examination: Body mass index is 29.42 kg/m.   General:  Average built, not in obvious distress, mildly anxious, chronically ill, HENT:   No scleral pallor or icterus noted. Oral mucosa is moist.  Chest:    Diminished breath sounds bilaterally. No crackles or wheezes.  Tenderness on palpation over the sternal region. CVS: S1 &S2 heard. No murmur.  Regular rate and rhythm. Abdomen: Soft, nontender, nondistended.  Bowel sounds are heard.   Extremities: Status post left below-knee amputation with stump, right lower extremity edema, left upper extremity AV fistula.  Psych: Alert, awake and Communicative, oriented, mildly  anxious, CNS:  No cranial nerve deficits.  Moves extremities. Skin: Warm and dry.     Data Reviewed:   CBC: Recent Labs  Lab 01/09/22 1124 01/10/22 0724 01/13/22 2022 01/14/22 0903 01/15/22 1042  WBC 7.5 7.2 9.7 9.4 7.6  HGB 10.6* 10.4* 10.1* 9.4* 10.4*  HCT 32.4* 31.5* 31.7* 28.8* 32.0*  MCV 88.0 87.5 90.3 88.9 89.4  PLT 241 247 223 212 216     Basic Metabolic Panel: Recent Labs  Lab 01/09/22 1124 01/10/22 0724 01/13/22 2022 01/14/22 0902 01/15/22 1043  NA 128* 125* 126* 126* 132*  K 4.1 4.7 4.9 5.6* 4.2  CL 89* 87* 88* 88* 95*  CO2 28 25 21* 22 26  GLUCOSE 181* 132* 164* 195* 157*  BUN 39* 52* 85* 88* 34*  CREATININE 4.69* 5.29* 6.60* 6.90* 3.48*  CALCIUM 8.8* 9.1 8.6* 8.5* 8.5*  PHOS 3.6 5.0* 5.9* 6.5* 3.5     Liver Function Tests: Recent Labs  Lab 01/09/22 1124 01/10/22 0724 01/13/22 2022 01/14/22 0902 01/15/22 1043  ALBUMIN 3.4* 3.4* 3.5 3.2* 3.4*      Radiology Studies: No results found.    LOS: 143 days    Flora Lipps, MD [facility] Available via Epic secure chat 7am-7pm After these hours, please refer to coverage provider listed on [email or web address]  01/16/2022, 7:46 AM

## 2022-01-16 NOTE — Progress Notes (Signed)
Mobility Specialist Progress Note:   01/16/22 1500  Mobility  Activity  (bed level stretches + exercises)  Level of Assistance Moderate assist, patient does 50-74%  Assistive Device None  Range of Motion/Exercises Active;Right leg;Left leg  Activity Response Tolerated fair  Mobility Referral Yes  $Mobility charge 1 Mobility   Pt agreeable to mobility stretches. Session focused on bed level exercises + stretches per PT. Pt tolerated well. Continued with PT session after session.  Nelta Numbers Mobility Specialist Please contact via SecureChat or  Rehab office at [phone]

## 2022-01-16 NOTE — Progress Notes (Signed)
[facility] Progress Note   Subjective:  Seen in room. No complaints. Got stump protector adjusted-hopefully will fit prosthesis better and allow for more mobility.   Objective Vitals:   01/15/22 1632 01/15/22 2201 01/16/22 0514 01/16/22 0830  BP: (!) 136/48 (!) 133/55 (!) 138/54 (!) 147/58  Pulse: 66 67 62   Resp:  18 18   Temp: 98.2 F (36.8 C) 98.5 F (36.9 C) 98 F (36.7 C)   TempSrc: Oral Oral Oral   SpO2: 96% 99% 98%   Weight:   93 kg   Height:       Physical Exam General: [name] Heart: RRR, no murmurs, rubs or gallops Lungs: CTA bilaterally, no wheezing, rhonchi or rales Abdomen: Soft, non-distended, +BS Extremities: trace edema L hip Dialysis Access:  AVF + bruit  Additional Objective Labs:  Blood Culture    Component Value Date/Time   SDES TISSUE 08/28/2021 0917   SPECREQUEST LEFT THIGH TISSUE 08/28/2021 0917   CULT  08/28/2021 0917    MODERATE ENTEROCOCCUS FAECIUM VANCOMYCIN RESISTANT ENTEROCOCCUS ISOLATED NO ANAEROBES ISOLATED Sent to [facility] for further susceptibility testing. Performed at [facility], [place] [address], [place], [place] [address]    REPTSTATUS 09/02/2021 FINAL 08/28/2021 9935    Cardiac Enzymes: No results for input(s): "CKTOTAL", "CKMB", "CKMBINDEX", "TROPONINI" in the last 168 hours. CBG: Recent Labs  Lab 01/15/22 1220 01/15/22 1629 01/15/22 2200 01/16/22 0719 01/16/22 1120  GLUCAP 163* 204* 280* 207* 186*    Iron Studies: No results for input(s): "IRON", "TIBC", "TRANSFERRIN", "FERRITIN" in the last 72 hours. @lablastinr3 @ Studies/Results: No results found. Medications:  albumin human      (feeding supplement) PROSource Plus  30 mL Oral BID BM   alosetron  1 mg Oral BID   amitriptyline  50 mg Oral QHS   atorvastatin  40 mg Oral q1800   carvedilol  3.125 mg Oral BID   darbepoetin (ARANESP) injection - DIALYSIS  100 mcg Subcutaneous Q Fri-1800   docusate sodium  200 mg Oral BID    DULoxetine  60 mg Oral Daily   famotidine  20 mg Oral Daily   fentaNYL  1 patch Transdermal Q72H   furosemide  80 mg Oral Daily   heparin sodium (porcine)  2,000 Units Intravenous Once   hydrALAZINE  50 mg Oral TID   insulin aspart  0-9 Units Subcutaneous TID WC   insulin aspart  4 Units Subcutaneous TID WC   insulin glargine-yfgn  10 Units Subcutaneous QHS   methocarbamol  500 mg Oral QID   multivitamin  1 tablet Oral QHS   pantoprazole  80 mg Oral Daily   polyethylene glycol  17 g Oral Daily   sevelamer carbonate  2,400 mg Oral TID WC   sodium zirconium cyclosilicate  10 g Oral Once per day on Sun Tue Thu Sat    Outpatient Dialysis Orders: MWF at [facility], 400/500, EDW 90kg, 2K/2Ca, LUE AVF - heparin 4000 unit IV bolus, no ESA    Assessment/Plan: 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 Completed 6 wk course of IV Daptomycin on 10/12/21. Per Dr. Jess Barters note 12/28/2021 "NO RESTRICTIONS L HIP".  Chronic L hip/residual limb pain: She does have fractures of L femoral neck (where hardware was removed) as well as distal femur Fx. Pain control per primary.  ESRD: Continue HD on usual MWF schedule. Clots HD system without heparin - using 4000 unit bolus and 2000 unit  mid-run bolus.  Hyperkalemia: Stable on scheduled lokelma 4 days a week.  HTN/volume: BP meds have been tapered down - currently on Coreg 3.125mg  BID only.  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 Last tsat 54%. Continue Aranesp 125mcg weekly.  Secondary HPTH: CorrCa slightly high, not on VDRA. Phos at goal. Continue Renvela as binder. Nutrition: Alb low, continue protein supplements.  Renal diet w/fluid restrictions.   AVF dysfunction (resolved): S/p branch ligation 09/19/2021  Dr. Virl Cagey. AVF working well now. DM2: Insulin per primary Dispo: SNF previously recommended, but she  declined. Not home HD candidate.  Dialyzed in recliner on 9/4 but signed off early due to pain, has declined to come in a recliner since then.Strongly discussed on the importance of increasing her mobility as much as possible while she is in her room in addition to following her ongoing fluid restriction. Also reinforced the importance of being in the chair for HD treatments to improve strength and mobility. Our goal is to dialyze her in the mornings so the patient has more time to work with PT in the afternoons (we will do our best to accommodate this as we still need to always consider the emergent cases).  Encouraged ongoing progress. Per Renal Navigator notes there are no clinics that will accept patient unless she will have HD in chair. Will continue to encourage use of chair for HD  Lynnda Child PA-C [place] 01/16/2022,3:25 PM

## 2022-01-16 NOTE — Progress Notes (Signed)
Physical Therapy Treatment Patient Details Name: [name] MRN: [id number] DOB: 09/29/1965 Today's Date: 01/16/2022   History of Present Illness 56 y.o. female presented to ED 6/26 from dialysis with increased bloody drainage from L hip wound. 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 s/p hardware removal of left hip with partial resection of tuft tissue and femure that were nonviable on 08/10/21 Discharged home 6/23. underwent L hip debridement and wound vac placement 6/28; +Left intertrochanteric non-union,  Fracture of left distal femur  PMH: hypertension, hyperlipidemia, ESRD on HD MWF, history of left BKA in 2021, depression/anxiety, stroke, tobacco use, T2DM,  insomnia, chronic pain syndrome,    PT Comments    Patient agreeable to attempt lateral scoot transfer to/from drop arm BSC with sliding board. Requires modA to don prosthesis on L. MinA needed to place sliding board and management of L prosthetic during transfer. Discussed potential transition to transferring to/from [facility] during the day to limit use of purewick and to improve overall strength and endurance to perform these tasks with ease and less assistance. Patient understanding of recommendation but unsure if ready to change daily routine of utilizing purewick. Will need to further encourage and discuss with patient. Continue to recommend SNF for ongoing Physical Therapy.       Recommendations for follow up therapy are one component of a multi-disciplinary discharge planning process, led by the attending physician.  Recommendations may be updated based on patient status, additional functional criteria and insurance authorization.  Follow Up Recommendations  Skilled nursing-short term rehab (<3 hours/day) Can patient physically be transported by private vehicle: No   Assistance Recommended at Discharge Intermittent Supervision/Assistance  Patient can return home with the following A lot of help  with bathing/dressing/bathroom;Assistance with cooking/housework;Assist for transportation;Two people to help with walking and/or transfers;Help with stairs or ramp for entrance   Equipment Recommendations  Other (comment) (Sliding board, drop-arm BSC; needs a wheelchair cushion)    Recommendations for Other Services       Precautions / Restrictions Precautions Precautions: Fall;Other (comment) Precaution Comments: L BKA (baseline)     Mobility  Bed Mobility Overal bed mobility: Needs Assistance Bed Mobility: Supine to Sit, Sit to Supine     Supine to sit: Modified independent (Device/Increase time), Min assist Sit to supine: Modified independent (Device/Increase time)        Transfers Overall transfer level: Needs assistance Equipment used: Sliding board Transfers: Bed to chair/wheelchair/BSC            Lateral/Scoot Transfers: Min assist, +2 safety/equipment General transfer comment: assist for sliding board placement and prosthetic maneuvering/placement during transfer. Able to laterally scoot to/from drop arm BSC    Ambulation/Gait                   Stairs             Wheelchair Mobility    Modified Rankin (Stroke Patients Only)       Balance Overall balance assessment: Needs assistance Sitting-balance support: Feet supported Sitting balance-Leahy Scale: Fair                                      Cognition Arousal/Alertness: Awake/alert Behavior During Therapy: WFL for tasks assessed/performed Overall Cognitive Status: Within Functional Limits for tasks assessed  Exercises      General Comments        Pertinent Vitals/Pain Pain Assessment Pain Assessment: Faces Faces Pain Scale: Hurts little more Pain Location: L LE in prosthetic during transfer Pain Descriptors / Indicators: Discomfort, Grimacing, Guarding Pain Intervention(s): Monitored during  session    Home Living                          Prior Function            PT Goals (current goals can now be found in the care plan section) Acute Rehab PT Goals PT Goal Formulation: With patient Time For Goal Achievement: 01/23/22 Potential to Achieve Goals: Fair Progress towards PT goals: Progressing toward goals    Frequency    Min 2X/week      PT Plan Current plan remains appropriate    Co-evaluation              AM-PAC PT "6 Clicks" Mobility   Outcome Measure  Help needed turning from your back to your side while in a flat bed without using bedrails?: None Help needed moving from lying on your back to sitting on the side of a flat bed without using bedrails?: A Little Help needed moving to and from a bed to a chair (including a wheelchair)?: A Little Help needed standing up from a chair using your arms (e.g., wheelchair or bedside chair)?: Total Help needed to walk in hospital room?: Total Help needed climbing 3-5 steps with a railing? : Total 6 Click Score: 13    End of Session Equipment Utilized During Treatment: Other (comment) (prosthesis) Activity Tolerance: Patient tolerated treatment well Patient left: in bed;with call bell/phone within reach Nurse Communication: Mobility status PT Visit Diagnosis: Other abnormalities of gait and mobility (R26.89);Muscle weakness (generalized) (M62.81);History of falling (Z91.81);Pain Pain - Right/Left: Left Pain - part of body: Leg     Time: 1421-1501 PT Time Calculation (min) (ACUTE ONLY): 40 min  Charges:  $Therapeutic Activity: 38-52 mins                     Raef Sprigg A. Gilford Rile PT, DPT Acute Rehabilitation Services Office [phone]    [name] 01/16/2022, 4:26 PM

## 2022-01-17 DIAGNOSIS — Z8673 Personal history of transient ischemic attack (TIA), and cerebral infarction without residual deficits: Secondary | ICD-10-CM | POA: Diagnosis not present

## 2022-01-17 DIAGNOSIS — M86252 Subacute osteomyelitis, left femur: Secondary | ICD-10-CM | POA: Diagnosis not present

## 2022-01-17 DIAGNOSIS — T148XXA Other injury of unspecified body region, initial encounter: Secondary | ICD-10-CM | POA: Diagnosis not present

## 2022-01-17 DIAGNOSIS — N186 End stage renal disease: Secondary | ICD-10-CM | POA: Diagnosis not present

## 2022-01-17 LAB — RENAL FUNCTION PANEL
Albumin: 3.5 g/dL (ref 3.5–5.0)
Anion gap: 14 (ref 5–15)
BUN: 36 mg/dL — ABNORMAL HIGH (ref 6–20)
CO2: 25 mmol/L (ref 22–32)
Calcium: 8.5 mg/dL — ABNORMAL LOW (ref 8.9–10.3)
Chloride: 89 mmol/L — ABNORMAL LOW (ref 98–111)
Creatinine, Ser: 3.28 mg/dL — ABNORMAL HIGH (ref 0.44–1.00)
GFR, Estimated: 16 mL/min — ABNORMAL LOW (ref >60)
Glucose, Bld: 178 mg/dL — ABNORMAL HIGH (ref 70–99)
Phosphorus: 2.9 mg/dL (ref 2.5–4.6)
Potassium: 3.7 mmol/L (ref 3.5–5.1)
Sodium: 128 mmol/L — ABNORMAL LOW (ref 135–145)

## 2022-01-17 LAB — CBC
HCT: 31 % — ABNORMAL LOW (ref 36.0–46.0)
Hemoglobin: 10.2 g/dL — ABNORMAL LOW (ref 12.0–15.0)
MCH: 29.3 pg (ref 26.0–34.0)
MCHC: 32.9 g/dL (ref 30.0–36.0)
MCV: 89.1 fL (ref 80.0–100.0)
Platelets: 221 10*3/uL (ref 150–400)
RBC: 3.48 MIL/uL — ABNORMAL LOW (ref 3.87–5.11)
RDW: 17.1 % — ABNORMAL HIGH (ref 11.5–15.5)
WBC: 6.3 10*3/uL (ref 4.0–10.5)
nRBC: 0 % (ref 0.0–0.2)

## 2022-01-17 LAB — GLUCOSE, CAPILLARY
Glucose-Capillary: 264 mg/dL — ABNORMAL HIGH (ref 70–99)
Glucose-Capillary: 116 mg/dL — ABNORMAL HIGH (ref 70–99)
Glucose-Capillary: 177 mg/dL — ABNORMAL HIGH (ref 70–99)

## 2022-01-17 MED ORDER — HEPARIN SODIUM (PORCINE) 1000 UNIT/ML DIALYSIS: 4000.0000 [IU] | INTRAMUSCULAR | Status: DC | PRN

## 2022-01-17 MED ORDER — PENTAFLUOROPROP-TETRAFLUOROETH EX AERO: 1.0000 | INHALATION_SPRAY | CUTANEOUS | Status: DC | PRN

## 2022-01-17 MED ORDER — HEPARIN SODIUM (PORCINE) 1000 UNIT/ML DIALYSIS
1000.0000 [IU] | INTRAMUSCULAR | Status: DC | PRN
  Filled 2022-01-17: qty 1

## 2022-01-17 MED ORDER — RISAQUAD PO CAPS
1.0000 | ORAL_CAPSULE | Freq: Every day | ORAL | Status: DC
  Administered 2022-01-17 – 2022-02-12 (×26): 1 via ORAL
  Filled 2022-01-17 (×27): qty 1

## 2022-01-17 MED ORDER — DICLOFENAC EPOLAMINE 1.3 % EX PTCH
1.0000 | MEDICATED_PATCH | Freq: Two times a day (BID) | CUTANEOUS | Status: AC
  Administered 2022-01-17 – 2022-01-20 (×8): 1 via TRANSDERMAL
  Filled 2022-01-17 (×9): qty 1

## 2022-01-17 NOTE — Progress Notes (Signed)
Received patient in bed to unit.  Alert and oriented.  Informed consent signed and in chart.   Treatment initiated: 1614 Treatment completed: 1947  Patient tolerated well.  Transported back to the room  Alert, without acute distress.  Hand-off given to patient's nurse.   Access used: AVF Access issues: none  Total UF removed: 4L Medication(s) given: none Post HD VS: 174/66,66,18,97.7 Post HD weight: 94kg   [facility]

## 2022-01-17 NOTE — Progress Notes (Addendum)
[facility] Progress Note   Subjective:   Patient seen and examined at bedside.  Reports she was able to fit into her prothesis better yesterday "6 clicks".  Reports constant pain lower chest/epigastric area present x 1 week that is worse with movement.  Nothing really improves it.  Otherwise no complaints.   **This patient will not be seen over this weekend (11/18-11/19/23) unless emergency. If any renal/dialysis needs from 7a-5p, please page Jen Mow, PA-C [phone]**   Objective Vitals:   01/16/22 1709 01/16/22 2126 01/17/22 0443 01/17/22 0931  BP: (!) 173/54 (!) 147/63 (!) 179/58 (!) 174/59  Pulse: (!) 58 61 62 61  Resp: 18 18 20    Temp: 98.6 F (37 C) 98.3 F (36.8 C) 98 F (36.7 C) 98.1 F (36.7 C)  TempSrc: Oral Oral Oral Oral  SpO2: 100% 99% 100% 98%  Weight:  93 kg    Height:       Physical Exam General:WDWN female in NAD Heart:RRR, no mrg Lungs:CTAB, nml WOB on RA Abdomen:soft, NTND, +flank edema Extremities:L BKA in shrinker, RLE trace edema Dialysis Access: AVF +b/t   Filed Weights   01/15/22 1153 01/16/22 0514 01/16/22 2126  Weight: 93 kg 93 kg 93 kg    Intake/Output Summary (Last 24 hours) at 01/17/2022 1412 Last data filed at 01/17/2022 0600 Gross per 24 hour  Intake 1040 ml  Output 550 ml  Net 490 ml    Additional Objective Labs: Basic Metabolic Panel: Recent Labs  Lab 01/13/22 2022 01/14/22 0902 01/15/22 1043  NA 126* 126* 132*  K 4.9 5.6* 4.2  CL 88* 88* 95*  CO2 21* 22 26  GLUCOSE 164* 195* 157*  BUN 85* 88* 34*  CREATININE 6.60* 6.90* 3.48*  CALCIUM 8.6* 8.5* 8.5*  PHOS 5.9* 6.5* 3.5   Liver Function Tests: Recent Labs  Lab 01/13/22 2022 01/14/22 0902 01/15/22 1043  ALBUMIN 3.5 3.2* 3.4*   CBC: Recent Labs  Lab 01/13/22 2022 01/14/22 0903 01/15/22 1042  WBC 9.7 9.4 7.6  HGB 10.1* 9.4* 10.4*  HCT 31.7* 28.8* 32.0*  MCV 90.3 88.9 89.4  PLT 223 212 216   CBG: Recent Labs  Lab  01/16/22 1120 01/16/22 1640 01/16/22 2125 01/17/22 0730 01/17/22 1134  GLUCAP 186* 177* 152* 264* 116*    Studies/Results: DG Abd Portable 1V  Result Date: 01/16/2022 CLINICAL DATA:  Abdominal pain EXAM: PORTABLE ABDOMEN - 1 VIEW COMPARISON:  Abdominal radiograph 11/10/2021 FINDINGS: No evidence of bowel obstruction. No radiopaque calculi overlie the kidneys. Calcified uterine fibroid noted overlying the pelvis. Unchanged chronic left proximal femur fracture. Right upper quadrant surgical clips. IMPRESSION: No evidence of bowel obstruction. Electronically Signed   By: Maurine Simmering M.D.   On: 01/16/2022 18:47    Medications:  albumin human      (feeding supplement) PROSource Plus  30 mL Oral BID BM   acidophilus  1 capsule Oral Daily   alosetron  1 mg Oral BID   amitriptyline  50 mg Oral QHS   atorvastatin  40 mg Oral q1800   carvedilol  3.125 mg Oral BID   darbepoetin (ARANESP) injection - DIALYSIS  100 mcg Subcutaneous Q Fri-1800   diclofenac  1 patch Transdermal BID   docusate sodium  200 mg Oral BID   DULoxetine  60 mg Oral Daily   famotidine  20 mg Oral Daily   fentaNYL  1 patch Transdermal Q72H   furosemide  80 mg Oral Daily   heparin sodium (porcine)  2,000 Units Intravenous Once   hydrALAZINE  50 mg Oral TID   insulin aspart  0-9 Units Subcutaneous TID WC   insulin aspart  4 Units Subcutaneous TID WC   insulin glargine-yfgn  10 Units Subcutaneous QHS   methocarbamol  500 mg Oral QID   multivitamin  1 tablet Oral QHS   pantoprazole  80 mg Oral Daily   polyethylene glycol  17 g Oral Daily   sevelamer carbonate  2,400 mg Oral TID WC   sodium zirconium cyclosilicate  10 g Oral Once per day on Sun Tue Thu Sat    Dialysis Orders: MWF at [facility], 400/500, EDW 90kg, 2K/2Ca, LUE AVF - heparin 4000 unit IV bolus, no ESA     Assessment/Plan: 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 Completed 6 wk course of IV  Daptomycin on 10/12/21. Per Dr. Jess Barters note 12/28/2021 "NO RESTRICTIONS L HIP".  Chronic L hip/residual limb pain: She does have fractures of L femoral neck (where hardware was removed) as well as distal femur Fx. Pain control per primary.  ESRD: Continue HD on usual MWF schedule. Clots HD system without heparin - using 4000 unit bolus and 2000 unit mid-run bolus.  Hyperkalemia: Stable on scheduled lokelma 4 days a week.  HTN/volume: BP meds have been tapered down - currently on Coreg 3.125mg  BID only.  large fluids gains between HD which has been discussed extensively- volume status slowly improving. Anemia of ESRD: Hgb 9-10s stable. Last tsat 54%. Continue Aranesp 134mcg weekly.  Secondary HPTH: CorrCa slightly high, not on VDRA. Phos at goal. Continue Renvela as binder. Nutrition: Alb low, continue protein supplements.  Renal diet w/fluid restrictions.   AVF dysfunction (resolved): S/p branch ligation 09/19/2021  Dr. Virl Cagey. AVF working well now. DM2: Insulin per primary Dispo: SNF previously recommended, but she declined. Not home HD candidate.  Dialyzed in recliner on 9/4 but signed off early due to pain, has declined to come in a recliner since then.  Strongly discussed on the importance of increasing her mobility as much as possible while she is in her room in addition to following her ongoing fluid restriction. Also reinforced the importance of being in the chair for HD treatments to improve strength and mobility. Our goal is to dialyze her in the mornings so the patient has more time to work with PT in the afternoons (we will do our best to accommodate this as we still need to always consider the emergent cases).  Encouraged ongoing progress. Per Renal Navigator notes there are no clinics that will accept patient unless she will have HD in chair. Will continue to encourage use of chair for HD   Jen Mow, PA-C [place] 01/17/2022,2:12 PM  LOS: 144 days

## 2022-01-17 NOTE — Progress Notes (Signed)
Mobility Specialist Progress Note:   01/17/22 1210  Mobility  Activity  (daily streches and exercises)  Assistive Device None  Range of Motion/Exercises Active;Passive  Activity Response Tolerated well  Mobility Referral Yes  $Mobility charge 1 Mobility   Pt agreeable to mobility session and daily stretches. Tolerated well. Pt still c/o upper abdominal pain, radiating to R flank. Pt left in bed with all needs met.   Nelta Numbers Mobility Specialist Please contact via SecureChat or  Rehab office at [phone]

## 2022-01-17 NOTE — Progress Notes (Signed)
PROGRESS NOTE    [name]  XNA:[id number] DOB: Jul 27, 1965 DOA: 08/26/2021 PCP: [name], MD    Brief Narrative:   Patient is 56 year old female with history of hypertension, hyperlipidemia, diabetes mellitus type 2, end-stage renal disease on hemodialysis, status post left BKA for chronic calcaneus osteomyelitis on 01/12/2020, bipolar disorder, left distal femoral fracture after a fall on 07/19/2021 deemed  nonoperative, admission from 08/03/2021-08/23/2021 for left femoral osteomyelitis requiring partial resection of femur and rearrangements of bony fragments with subsequent antibiotic treatment presented to the hospital with left hip bleeding and was found to have subacute osteomyelitis and abscess of left femur.  Orthopedic/ID were consulted and patient was started on broad-spectrum antibiotics.  Underwent multiple I&D's by Dr. [name] along with local tissue rearrangement.  She was also seen by vascular surgery who did  fistula branch ligation.  She has completed 6-week course of daptomycin on 10/09/2021.  She is unable to complete hemodialysis in chair and outpatient hemodialysis unit has been hard to find.  She is otherwise medically stable for discharge.   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  Active Problems:   ESRD (end stage renal disease) on dialysis ([place])   Type 2 diabetes mellitus with hyperlipidemia (HCC)   History of CVA (cerebrovascular accident)   Essential hypertension   Anxiety and depression   GASTROESOPHAGEAL REFLUX, NO ESOPHAGITIS   TOBACCO DEPENDENCE   Hyponatremia   Obesity (BMI 30.0-34.9)   Unilateral amputation of left foot (HCC)   Wound infection   History of CVA (cerebrovascular accident) without residual deficits   Hyperkalemia   Wound dehiscence, surgical, sequela   Subacute osteomyelitis of left femur with abscess -Status post surgical intervention by Dr. [name].  Completed a 6-week course of daptomycin  on 10/09/2021.  Continue muscle relaxer and oxycodone.  Re imaging on 12/02/2021 of left knee showed healing fracture with callus.  Orthopedics Dr [name] followed and is doing well.  No left hip restrictions at this time.    Chronic pain.  Patient is status in a patient with current medication.  Already on fentanyl patch and oral oxycodone every 4 hourly.  On muscle relaxant as well.  On increased dose of amitriptyline to 50 mg at night.  Continue muscle relaxant every 6 around-the-clock.  Anterior chest wall pain.  Abdominal x-ray was negative yesterday.  Appears to be costochondritis related.  We will add a Lidoderm patch, warm compress.  Continue muscle relaxant and oxycodone.  Right thigh mild cellulitis Improved, received a course of antibiotic in the past.  Likely from anasarca.    History of left intertrochanteric fracture Left hip hardware removed on 08/10/2021 prior to admission.  Physical therapy working to scoot and mobilize with wheelchair.  Will need at least 4 hours to be able to be considered for outpatient hemodialysis.  End-stage renal disease on hemodialysis Anasarca -Nephrology following.  Continue dialysis as per nephrology.   Strict intake and output charting Daily weights.  Fluid restriction.  On intermittent Lokelma for hyperkalemia.  Currently able to sit for around 2 hours in hemodialysis chair.  Hyponatremia Latest sodium of 132.  Managed with hemodialysis.   Hyperphosphatemia--continue sevelamer.  Anemia of chronic disease Latest hemoglobin  at 10.4.  Continue to monitor.  Diabetes mellitus type 2 with hyperglycemia Continue sliding scale insulin mealtime insulin and long-acting insulin.  Continue to monitor closely.   Essential hypertension Continue Coreg, hydralazine, Lasix    History of unspecified CVA Hyperlipidemia  Continue Lipitor.   GERD -Continue Protonix and Pepcid   Tobacco use Stable at this time.   Anxiety and depression Continue amitriptyline  and Cymbalta.    DVT prophylaxis: Place TED hose Start: 09/06/21 1507 SCDs Start: 08/31/21 1058   Code Status:     Code Status: Full Code  Disposition: Uncertain at this time.  Status is: Inpatient  Remains inpatient appropriate because: Pending disposition plan.  Long length of stay patient.   Family Communication: None at bedside.  Consultants:  Nephrology Vascular surgery Orthopedics Infectious disease  Procedures:  I&D's with tissue arrangement. Fistula branch ligation. Hemodialysis  Antimicrobials:  None currently   Subjective: Today, she was seen and examined at bedside.  Complains of chest wall pain.  No nausea vomiting fever chills or rigor.   Objective: Vitals:   01/16/22 1709 01/16/22 2126 01/17/22 0443 01/17/22 0931  BP: (!) 173/54 (!) 147/63 (!) 179/58 (!) 174/59  Pulse: (!) 58 61 62 61  Resp: 18 18 20    Temp: 98.6 F (37 C) 98.3 F (36.8 C) 98 F (36.7 C) 98.1 F (36.7 C)  TempSrc: Oral Oral Oral Oral  SpO2: 100% 99% 100% 98%  Weight:  93 kg    Height:        Intake/Output Summary (Last 24 hours) at 01/17/2022 1139 Last data filed at 01/17/2022 0600 Gross per 24 hour  Intake 1040 ml  Output 550 ml  Net 490 ml    Filed Weights   01/15/22 1153 01/16/22 0514 01/16/22 2126  Weight: 93 kg 93 kg 93 kg    Physical Examination: Body mass index is 29.42 kg/m.   General:  Average built, not in obvious distress, mildly anxious, chronically ill, HENT:   No scleral pallor or icterus noted. Oral mucosa is moist.  Chest:    Diminished breath sounds bilaterally. No crackles or wheezes.  Tenderness on palpation over the sternal region. CVS: S1 &S2 heard. No murmur.  Regular rate and rhythm. Abdomen: Soft, nontender, nondistended.  Bowel sounds are heard.   Extremities: Status post left below-knee amputation with stump, right lower extremity edema, left upper extremity AV fistula.  Psych: Alert, awake and Communicative, oriented, mildly  anxious, CNS:  No cranial nerve deficits.  Moves extremities. Skin: Warm and dry.     Data Reviewed:   CBC: Recent Labs  Lab 01/13/22 2022 01/14/22 0903 01/15/22 1042  WBC 9.7 9.4 7.6  HGB 10.1* 9.4* 10.4*  HCT 31.7* 28.8* 32.0*  MCV 90.3 88.9 89.4  PLT 223 212 216     Basic Metabolic Panel: Recent Labs  Lab 01/13/22 2022 01/14/22 0902 01/15/22 1043  NA 126* 126* 132*  K 4.9 5.6* 4.2  CL 88* 88* 95*  CO2 21* 22 26  GLUCOSE 164* 195* 157*  BUN 85* 88* 34*  CREATININE 6.60* 6.90* 3.48*  CALCIUM 8.6* 8.5* 8.5*  PHOS 5.9* 6.5* 3.5     Liver Function Tests: Recent Labs  Lab 01/13/22 2022 01/14/22 0902 01/15/22 1043  ALBUMIN 3.5 3.2* 3.4*      Radiology Studies: DG Abd Portable 1V  Result Date: 01/16/2022 CLINICAL DATA:  Abdominal pain EXAM: PORTABLE ABDOMEN - 1 VIEW COMPARISON:  Abdominal radiograph 11/10/2021 FINDINGS: No evidence of bowel obstruction. No radiopaque calculi overlie the kidneys. Calcified uterine fibroid noted overlying the pelvis. Unchanged chronic left proximal femur fracture. Right upper quadrant surgical clips. IMPRESSION: No evidence of bowel obstruction. Electronically Signed   By: Maurine Simmering M.D.   On: 01/16/2022 18:47  LOS: 144 days    Flora Lipps, MD [facility] Available via Epic secure chat 7am-7pm After these hours, please refer to coverage provider listed on [email or web address] 01/17/2022, 11:39 AM

## 2022-01-17 NOTE — Plan of Care (Signed)
CHANGE IN DIALYSIS SCHEDULE DUE TO HOLIDAY  [name] 1966/01/26 [id number]  Patient dialysis schedule for the week of 01/19/22-01/26/22 varies from their normal schedule due to Thanksgiving Holiday.  Need to keep this in mind for discharge planning.  The Holiday schedule is as follow:   Normal HD Schedule: Monday Wednesday Friday Thanksgiving schedule:Sunday, Tuesday, Friday  They will resume their normal schedule on 01/27/22.     Jen Mow, PA-C [facility] Pager: [phone]

## 2022-01-18 DIAGNOSIS — N186 End stage renal disease: Secondary | ICD-10-CM | POA: Diagnosis not present

## 2022-01-18 DIAGNOSIS — T148XXA Other injury of unspecified body region, initial encounter: Secondary | ICD-10-CM | POA: Diagnosis not present

## 2022-01-18 DIAGNOSIS — Z8673 Personal history of transient ischemic attack (TIA), and cerebral infarction without residual deficits: Secondary | ICD-10-CM | POA: Diagnosis not present

## 2022-01-18 DIAGNOSIS — M86252 Subacute osteomyelitis, left femur: Secondary | ICD-10-CM | POA: Diagnosis not present

## 2022-01-18 MED ORDER — CHLORHEXIDINE GLUCONATE CLOTH 2 % EX PADS: 6.0000 | MEDICATED_PAD | Freq: Every day | CUTANEOUS | Status: DC

## 2022-01-18 NOTE — Progress Notes (Signed)
PROGRESS NOTE    [name]  GNO:[id number] DOB: Dec 15, 1965 DOA: 08/26/2021 PCP: [name], MD    Brief Narrative:   Patient is 56 year old female with history of hypertension, hyperlipidemia, diabetes mellitus type 2, end-stage renal disease on hemodialysis, status post left BKA for chronic calcaneus osteomyelitis on 01/12/2020, bipolar disorder, left distal femoral fracture after a fall on 07/19/2021 deemed  nonoperative, admission from 08/03/2021-08/23/2021 for left femoral osteomyelitis requiring partial resection of femur and rearrangements of bony fragments with subsequent antibiotic treatment presented to the hospital with left hip bleeding and was found to have subacute osteomyelitis and abscess of left femur.  Orthopedic/ID were consulted and patient was started on broad-spectrum antibiotics.  Underwent multiple I&D's by Dr. [name] along with local tissue rearrangement.  She was also seen by vascular surgery who did  fistula branch ligation.  She has completed 6-week course of daptomycin on 10/09/2021.  She is unable to complete hemodialysis in chair and outpatient hemodialysis unit has been hard to find.  She is otherwise medically stable for discharge.   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  Active Problems:   ESRD (end stage renal disease) on dialysis ([place])   Type 2 diabetes mellitus with hyperlipidemia (HCC)   History of CVA (cerebrovascular accident)   Essential hypertension   Anxiety and depression   GASTROESOPHAGEAL REFLUX, NO ESOPHAGITIS   TOBACCO DEPENDENCE   Hyponatremia   Obesity (BMI 30.0-34.9)   Unilateral amputation of left foot (HCC)   Wound infection   History of CVA (cerebrovascular accident) without residual deficits   Hyperkalemia   Wound dehiscence, surgical, sequela   Subacute osteomyelitis of left femur with abscess -Status post surgical intervention by Dr. [name].  Completed a 6-week course of daptomycin  on 10/09/2021.  Continue muscle relaxant and oxycodone.  Re imaging on 12/02/2021 of left knee showed healing fracture with callus.  Orthopedics Dr [name] has followed and is doing well.  No left hip restrictions at this time.    Chronic pain.   on fentanyl patch and oral oxycodone, muscle relaxant.  Still complains of pain.  Currently on increased dose of amitriptyline to 50 mg at night and muscle relaxant every 6 around-the-clock.  Anterior chest wall pain.  Abdominal x-ray was negative. Appears to be costochondritis related.  Continue diclofenac patch, warm compress.  Continue muscle relaxant and oxycodone.  Right thigh mild cellulitis Improved, received a course of antibiotic in the past.  Likely from anasarca.    History of left intertrochanteric fracture Left hip hardware removed on 08/10/2021 prior to admission.  Physical therapy working to scoot and mobilize with wheelchair.  Will need at least 4 hours to be able to be considered for outpatient hemodialysis.  End-stage renal disease on hemodialysis Anasarca -Nephrology following.  Continue dialysis as per nephrology.   Strict intake and output charting Daily weights.  Fluid restriction.  On intermittent Lokelma for hyperkalemia.  Latest Potassium of 3.7.  Currently able to sit for around 2 hours in hemodialysis chair.  Hyponatremia Latest sodium of 128.  Managed with hemodialysis.   Hyperphosphatemia--continue sevelamer.  Anemia of chronic disease Latest hemoglobin  at 10.2.  Continue to monitor.  Diabetes mellitus type 2 with hyperglycemia Continue sliding scale insulin mealtime insulin and long-acting insulin.  Continue to monitor closely.   Essential hypertension Continue Coreg, hydralazine, Lasix    History of unspecified CVA Hyperlipidemia Continue Lipitor.   GERD -Continue Protonix and Pepcid  Tobacco use Stable at this time.   Anxiety and depression Continue amitriptyline and Cymbalta.    DVT prophylaxis: Place  TED hose Start: 09/06/21 1507 SCDs Start: 08/31/21 1058   Code Status:     Code Status: Full Code  Disposition: Uncertain at this time.  Status is: Inpatient  Remains inpatient appropriate because: Pending disposition plan.  Long length of stay patient.   Family Communication: None at bedside.  Consultants:  Nephrology Vascular surgery Orthopedics Infectious disease  Procedures:  I&D's with tissue arrangement. Fistula branch ligation. Hemodialysis  Antimicrobials:  None currently   Subjective: Today, patient was seen and examined at bedside.  Denies nausea vomiting fever but has mild chest wall discomfort.   Objective: Vitals:   01/17/22 2028 01/18/22 0544 01/18/22 0559 01/18/22 0846  BP: (!) 134/56  (!) 160/61 (!) 167/58  Pulse: 68  66 67  Resp: 20  18   Temp: 98.6 F (37 C)  98.6 F (37 C) 98.4 F (36.9 C)  TempSrc: Oral  Oral Oral  SpO2: 97%  99% 100%  Weight:  95.7 kg    Height:        Intake/Output Summary (Last 24 hours) at 01/18/2022 1308 Last data filed at 01/18/2022 1038 Gross per 24 hour  Intake 480 ml  Output 4400 ml  Net -3920 ml    Filed Weights   01/16/22 0514 01/16/22 2126 01/18/22 0544  Weight: 93 kg 93 kg 95.7 kg    Physical Examination: Body mass index is 30.27 kg/m.   General: Mildly anxious, chronically ill-appearing, obese, HENT:   No scleral pallor or icterus noted. Oral mucosa is moist.  Chest:   Clear breath sounds, diminished breath sounds bilaterally. No crackles or wheezes.  Tenderness on palpation over the left costochondral junction.   CVS: S1 &S2 heard. No murmur.  Regular rate and rhythm. Abdomen: Soft, nontender, nondistended.  Bowel sounds are heard.   Extremities: Left status post below-knee amputation.   left upper extremity AV fistula.  Right lower extremity edema. Psych: Alert, awake and Communicative, oriented. CNS:  No cranial nerve deficits.  Moves all extremities. Skin: Warm and dry.     Data  Reviewed:   CBC: Recent Labs  Lab 01/13/22 2022 01/14/22 0903 01/15/22 1042 01/17/22 1720  WBC 9.7 9.4 7.6 6.3  HGB 10.1* 9.4* 10.4* 10.2*  HCT 31.7* 28.8* 32.0* 31.0*  MCV 90.3 88.9 89.4 89.1  PLT 223 212 216 221     Basic Metabolic Panel: Recent Labs  Lab 01/13/22 2022 01/14/22 0902 01/15/22 1043 01/17/22 1720  NA 126* 126* 132* 128*  K 4.9 5.6* 4.2 3.7  CL 88* 88* 95* 89*  CO2 21* 22 26 25   GLUCOSE 164* 195* 157* 178*  BUN 85* 88* 34* 36*  CREATININE 6.60* 6.90* 3.48* 3.28*  CALCIUM 8.6* 8.5* 8.5* 8.5*  PHOS 5.9* 6.5* 3.5 2.9     Liver Function Tests: Recent Labs  Lab 01/13/22 2022 01/14/22 0902 01/15/22 1043 01/17/22 1720  ALBUMIN 3.5 3.2* 3.4* 3.5      Radiology Studies: DG Abd Portable 1V  Result Date: 01/16/2022 CLINICAL DATA:  Abdominal pain EXAM: PORTABLE ABDOMEN - 1 VIEW COMPARISON:  Abdominal radiograph 11/10/2021 FINDINGS: No evidence of bowel obstruction. No radiopaque calculi overlie the kidneys. Calcified uterine fibroid noted overlying the pelvis. Unchanged chronic left proximal femur fracture. Right upper quadrant surgical clips. IMPRESSION: No evidence of bowel obstruction. Electronically Signed   By: Maurine Simmering M.D.   On: 01/16/2022 18:47  LOS: 145 days    Flora Lipps, MD [facility] Available via Epic secure chat 7am-7pm After these hours, please refer to coverage provider listed on [email or web address] 01/18/2022, 1:08 PM

## 2022-01-19 DIAGNOSIS — Z8673 Personal history of transient ischemic attack (TIA), and cerebral infarction without residual deficits: Secondary | ICD-10-CM | POA: Diagnosis not present

## 2022-01-19 DIAGNOSIS — T148XXA Other injury of unspecified body region, initial encounter: Secondary | ICD-10-CM | POA: Diagnosis not present

## 2022-01-19 DIAGNOSIS — N186 End stage renal disease: Secondary | ICD-10-CM | POA: Diagnosis not present

## 2022-01-19 DIAGNOSIS — M86252 Subacute osteomyelitis, left femur: Secondary | ICD-10-CM | POA: Diagnosis not present

## 2022-01-19 LAB — CBC
HCT: 31.4 % — ABNORMAL LOW (ref 36.0–46.0)
Hemoglobin: 10 g/dL — ABNORMAL LOW (ref 12.0–15.0)
MCH: 29 pg (ref 26.0–34.0)
MCHC: 31.8 g/dL (ref 30.0–36.0)
MCV: 91 fL (ref 80.0–100.0)
Platelets: 232 10*3/uL (ref 150–400)
RBC: 3.45 MIL/uL — ABNORMAL LOW (ref 3.87–5.11)
RDW: 17.1 % — ABNORMAL HIGH (ref 11.5–15.5)
WBC: 6.4 10*3/uL (ref 4.0–10.5)
nRBC: 0 % (ref 0.0–0.2)

## 2022-01-19 LAB — GLUCOSE, CAPILLARY
Glucose-Capillary: 180 mg/dL — ABNORMAL HIGH (ref 70–99)
Glucose-Capillary: 234 mg/dL — ABNORMAL HIGH (ref 70–99)
Glucose-Capillary: 184 mg/dL — ABNORMAL HIGH (ref 70–99)

## 2022-01-19 LAB — RENAL FUNCTION PANEL
Albumin: 3.4 g/dL — ABNORMAL LOW (ref 3.5–5.0)
Anion gap: 13 (ref 5–15)
BUN: 57 mg/dL — ABNORMAL HIGH (ref 6–20)
CO2: 25 mmol/L (ref 22–32)
Calcium: 8.9 mg/dL (ref 8.9–10.3)
Chloride: 89 mmol/L — ABNORMAL LOW (ref 98–111)
Creatinine, Ser: 4.63 mg/dL — ABNORMAL HIGH (ref 0.44–1.00)
GFR, Estimated: 11 mL/min — ABNORMAL LOW (ref >60)
Glucose, Bld: 207 mg/dL — ABNORMAL HIGH (ref 70–99)
Phosphorus: 5.9 mg/dL — ABNORMAL HIGH (ref 2.5–4.6)
Potassium: 4.8 mmol/L (ref 3.5–5.1)
Sodium: 127 mmol/L — ABNORMAL LOW (ref 135–145)

## 2022-01-19 MED ORDER — HEPARIN SODIUM (PORCINE) 1000 UNIT/ML DIALYSIS: 1000.0000 [IU] | INTRAMUSCULAR | Status: DC | PRN

## 2022-01-19 MED ORDER — HEPARIN SODIUM (PORCINE) 1000 UNIT/ML IJ SOLN
INTRAMUSCULAR | Status: AC
  Administered 2022-01-19: 4000 [IU]
  Filled 2022-01-19: qty 4

## 2022-01-19 MED ORDER — ALTEPLASE 2 MG IJ SOLR: 2.0000 mg | Freq: Once | INTRAMUSCULAR | Status: DC | PRN

## 2022-01-19 MED ORDER — HEPARIN SODIUM (PORCINE) 1000 UNIT/ML DIALYSIS: 4000.0000 [IU] | INTRAMUSCULAR | Status: DC | PRN

## 2022-01-19 MED ORDER — ANTICOAGULANT SODIUM CITRATE 4% (200MG/5ML) IV SOLN: 5.0000 mL | Status: DC | PRN

## 2022-01-19 NOTE — Progress Notes (Signed)
Received patient in bed to unit.  Alert and oriented.  Informed consent signed and in chart.   Treatment initiated: 0811 Treatment completed: 1213  Patient tolerated well.  Transported back to the room  Alert, without acute distress.  Hand-off given to patient's nurse.   Access used: AVF Access issues: None  Total UF removed: 3.5L Medication(s) given: None Post HD VS: 158/62,15,68,100%,98.1 Post HD weight: 94.4kg   [facility]

## 2022-01-19 NOTE — Progress Notes (Signed)
PROGRESS NOTE    [name]  VEH:[id number] DOB: February 07, 1966 DOA: 08/26/2021 PCP: [name], MD    Brief Narrative:   Patient is 56 year old female with history of hypertension, hyperlipidemia, diabetes mellitus type 2, end-stage renal disease on hemodialysis, status post left BKA for chronic calcaneus osteomyelitis on 01/12/2020, bipolar disorder, left distal femoral fracture after a fall on 07/19/2021 deemed  nonoperative, admission from 08/03/2021-08/23/2021 for left femoral osteomyelitis requiring partial resection of femur and rearrangements of bony fragments with subsequent antibiotic treatment presented to the hospital with left hip bleeding and was found to have subacute osteomyelitis and abscess of left femur.  Orthopedic/ID were consulted and patient was started on broad-spectrum antibiotics.  Underwent multiple I&D's by Dr. [name] along with local tissue rearrangement.  She was also seen by vascular surgery who did  fistula branch ligation.  She has completed 6-week course of daptomycin on 10/09/2021.  She has been unable to complete hemodialysis in chair and outpatient hemodialysis unit has been hard to find.  She is otherwise medically stable for discharge.   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  Active Problems:   ESRD (end stage renal disease) on dialysis ([place])   Type 2 diabetes mellitus with hyperlipidemia (HCC)   History of CVA (cerebrovascular accident)   Essential hypertension   Anxiety and depression   GASTROESOPHAGEAL REFLUX, NO ESOPHAGITIS   TOBACCO DEPENDENCE   Hyponatremia   Obesity (BMI 30.0-34.9)   Unilateral amputation of left foot (HCC)   Wound infection   History of CVA (cerebrovascular accident) without residual deficits   Hyperkalemia   Wound dehiscence, surgical, sequela   Subacute osteomyelitis of left femur with abscess Status post surgical intervention by Dr. [name].  Completed a 6-week course of  daptomycin on 10/09/2021.  Continue muscle relaxant and oxycodone.  Re imaging on 12/02/2021 of left knee showed healing fracture with callus.  Orthopedics Dr [name] has followed and is doing well.  No left hip restrictions at this time.    Chronic pain.   on fentanyl patch and oral oxycodone, muscle relaxant.  Still complains of pain.  Currently on increased dose of amitriptyline to 50 mg at night and muscle relaxant every 6 around-the-clock.  Anterior chest wall pain.  Abdominal x-ray was negative. Appears to be costochondritis related.  Continue diclofenac patch, warm compress.  Continue muscle relaxant and oxycodone.  Right thigh mild cellulitis Improved, received a course of antibiotic in the past.  Likely from anasarca.    History of left intertrochanteric fracture Left hip hardware removed on 08/10/2021 prior to admission.  Physical therapy working to scoot and mobilize with wheelchair.  Will need at least 4 hours to be able to be considered for outpatient hemodialysis.  End-stage renal disease on hemodialysis Anasarca -Nephrology following.  Continue dialysis as per nephrology.   Strict intake and output charting Daily weights.  Fluid restriction.  On intermittent Lokelma for hyperkalemia.  Latest Potassium of 3.7.  Currently able to sit for around 2 hours in hemodialysis chair.  Hyponatremia Latest sodium of 127.  Managed with hemodialysis.   Hyperphosphatemia--continue sevelamer.  Anemia of chronic disease Latest hemoglobin  at 10.0.  Continue to monitor.  Diabetes mellitus type 2 with hyperglycemia Continue sliding scale insulin mealtime insulin and long-acting insulin.  Continue to monitor closely.   Essential hypertension Continue Coreg, hydralazine, Lasix    History of unspecified CVA Hyperlipidemia Continue Lipitor.   GERD -Continue Protonix and Pepcid  Tobacco use Stable at this time.   Anxiety and depression Continue amitriptyline and Cymbalta.    DVT  prophylaxis: Place TED hose Start: 09/06/21 1507 SCDs Start: 08/31/21 1058   Code Status:     Code Status: Full Code  Disposition: Uncertain at this time.  Status is: Inpatient  Remains inpatient appropriate because: Pending disposition plan.  Long length of stay patient.   Family Communication: None at bedside.  Consultants:  Nephrology Vascular surgery Orthopedics Infectious disease  Procedures:  I&D's with tissue arrangement. Fistula branch ligation. Hemodialysis  Antimicrobials:  None currently   Subjective: Today, seen and examined during hemodialysis.  Wants to eat a yogurt.  Still complains of pain at multiple sites including the chest.  Objective: Vitals:   01/19/22 0830 01/19/22 0900 01/19/22 0930 01/19/22 1000  BP: (!) 158/52 (!) 128/55 (!) 157/78 (!) 177/60  Pulse:      Resp:    13  Temp:      TempSrc:      SpO2:      Weight:      Height:        Intake/Output Summary (Last 24 hours) at 01/19/2022 1106 Last data filed at 01/19/2022 0800 Gross per 24 hour  Intake 360 ml  Output 25 ml  Net 335 ml    Filed Weights   01/16/22 0514 01/16/22 2126 01/18/22 0544  Weight: 93 kg 93 kg 95.7 kg    Physical Examination: Body mass index is 30.27 kg/m.   General:  not in obvious distress, mildly anxious, chronically ill-appearing, obese HENT:   No scleral pallor or icterus noted. Oral mucosa is moist.  Chest:  Diminished breath sounds bilaterally. No crackles or wheezes.  Left chest wall tenderness. CVS: S1 &S2 heard. No murmur.  Regular rate and rhythm. Abdomen: Soft, nontender, nondistended.  Bowel sounds are heard.   Extremities: Left status post below-knee amputation.  Left upper extremity AV fistula.  Right lower extremity edema. Psych: Alert, awake and oriented, normal mood CNS:  No cranial nerve deficits.  Moves all extremities. Skin: Warm and dry.    Data Reviewed:   CBC: Recent Labs  Lab 01/13/22 2022 01/14/22 0903 01/15/22 1042  01/17/22 1720 01/19/22 0800  WBC 9.7 9.4 7.6 6.3 6.4  HGB 10.1* 9.4* 10.4* 10.2* 10.0*  HCT 31.7* 28.8* 32.0* 31.0* 31.4*  MCV 90.3 88.9 89.4 89.1 91.0  PLT 223 212 216 221 232     Basic Metabolic Panel: Recent Labs  Lab 01/13/22 2022 01/14/22 0902 01/15/22 1043 01/17/22 1720 01/19/22 0800  NA 126* 126* 132* 128* 127*  K 4.9 5.6* 4.2 3.7 4.8  CL 88* 88* 95* 89* 89*  CO2 21* 22 26 25 25   GLUCOSE 164* 195* 157* 178* 207*  BUN 85* 88* 34* 36* 57*  CREATININE 6.60* 6.90* 3.48* 3.28* 4.63*  CALCIUM 8.6* 8.5* 8.5* 8.5* 8.9  PHOS 5.9* 6.5* 3.5 2.9 5.9*     Liver Function Tests: Recent Labs  Lab 01/13/22 2022 01/14/22 0902 01/15/22 1043 01/17/22 1720 01/19/22 0800  ALBUMIN 3.5 3.2* 3.4* 3.5 3.4*      Radiology Studies: No results found.    LOS: 146 days    Flora Lipps, MD [facility] Available via Epic secure chat 7am-7pm After these hours, please refer to coverage provider listed on [email or web address] 01/19/2022, 11:06 AM

## 2022-01-20 DIAGNOSIS — N186 End stage renal disease: Secondary | ICD-10-CM | POA: Diagnosis not present

## 2022-01-20 DIAGNOSIS — T148XXA Other injury of unspecified body region, initial encounter: Secondary | ICD-10-CM | POA: Diagnosis not present

## 2022-01-20 DIAGNOSIS — Z8673 Personal history of transient ischemic attack (TIA), and cerebral infarction without residual deficits: Secondary | ICD-10-CM | POA: Diagnosis not present

## 2022-01-20 DIAGNOSIS — M86252 Subacute osteomyelitis, left femur: Secondary | ICD-10-CM | POA: Diagnosis not present

## 2022-01-20 LAB — GLUCOSE, CAPILLARY
Glucose-Capillary: 170 mg/dL — ABNORMAL HIGH (ref 70–99)
Glucose-Capillary: 177 mg/dL — ABNORMAL HIGH (ref 70–99)
Glucose-Capillary: 202 mg/dL — ABNORMAL HIGH (ref 70–99)
Glucose-Capillary: 172 mg/dL — ABNORMAL HIGH (ref 70–99)
Glucose-Capillary: 163 mg/dL — ABNORMAL HIGH (ref 70–99)
Glucose-Capillary: 128 mg/dL — ABNORMAL HIGH (ref 70–99)
Glucose-Capillary: 254 mg/dL — ABNORMAL HIGH (ref 70–99)
Glucose-Capillary: 108 mg/dL — ABNORMAL HIGH (ref 70–99)
Glucose-Capillary: 203 mg/dL — ABNORMAL HIGH (ref 70–99)

## 2022-01-20 NOTE — Progress Notes (Signed)
[facility] Progress Note   Subjective:  Seen in room - feels ok today. Denies CP/dyspnea. Dialyzed yesterday - no major issues.  Objective Vitals:   01/19/22 1645 01/19/22 2038 01/20/22 0500 01/20/22 0950  BP: (!) 162/54 (!) 146/69 (!) 144/65 (!) 174/64  Pulse: 70 73 70 71  Resp: 17 18 18 17   Temp: 98.3 F (36.8 C) 98.9 F (37.2 C) 98.4 F (36.9 C) 98.5 F (36.9 C)  TempSrc:  Oral Oral   SpO2: 99% 99% 98% 99%  Weight:      Height:       Physical Exam General: Well appearing woman, NAD. Room air. Heart: RRR; no murmur Lungs: CTAB; no rales Abdomen: soft Extremities: RLE and L BKA with 1+ edema; L stump shrinker in place Dialysis Access: LUE AVF + bruit  Additional Objective Labs: Basic Metabolic Panel: Recent Labs  Lab 01/15/22 1043 01/17/22 1720 01/19/22 0800  NA 132* 128* 127*  K 4.2 3.7 4.8  CL 95* 89* 89*  CO2 26 25 25   GLUCOSE 157* 178* 207*  BUN 34* 36* 57*  CREATININE 3.48* 3.28* 4.63*  CALCIUM 8.5* 8.5* 8.9  PHOS 3.5 2.9 5.9*   Liver Function Tests: Recent Labs  Lab 01/15/22 1043 01/17/22 1720 01/19/22 0800  ALBUMIN 3.4* 3.5 3.4*   CBC: Recent Labs  Lab 01/13/22 2022 01/14/22 0903 01/15/22 1042 01/17/22 1720 01/19/22 0800  WBC 9.7 9.4 7.6 6.3 6.4  HGB 10.1* 9.4* 10.4* 10.2* 10.0*  HCT 31.7* 28.8* 32.0* 31.0* 31.4*  MCV 90.3 88.9 89.4 89.1 91.0  PLT 223 212 216 221 232    albumin human      (feeding supplement) PROSource Plus  30 mL Oral BID BM   acidophilus  1 capsule Oral Daily   alosetron  1 mg Oral BID   amitriptyline  50 mg Oral QHS   atorvastatin  40 mg Oral q1800   carvedilol  3.125 mg Oral BID   darbepoetin (ARANESP) injection - DIALYSIS  100 mcg Subcutaneous Q Fri-1800   diclofenac  1 patch Transdermal BID   docusate sodium  200 mg Oral BID   DULoxetine  60 mg Oral Daily   famotidine  20 mg Oral Daily   fentaNYL  1 patch Transdermal Q72H   furosemide  80 mg Oral Daily   heparin sodium (porcine)  2,000  Units Intravenous Once   hydrALAZINE  50 mg Oral TID   insulin aspart  0-9 Units Subcutaneous TID WC   insulin aspart  4 Units Subcutaneous TID WC   insulin glargine-yfgn  10 Units Subcutaneous QHS   methocarbamol  500 mg Oral QID   multivitamin  1 tablet Oral QHS   pantoprazole  80 mg Oral Daily   polyethylene glycol  17 g Oral Daily   sevelamer carbonate  2,400 mg Oral TID WC   sodium zirconium cyclosilicate  10 g Oral Once per day on Sun Tue Thu Sat    Dialysis Orders: MWF at [facility], 400/500, EDW 90kg, 2K/2Ca, LUE AVF - heparin 4000 unit IV bolus, no ESA     Assessment/Plan: 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 Completed 6 wk course of IV Daptomycin on 10/12/21. Per Dr. Jess Barters note 12/28/2021 "NO RESTRICTIONS L HIP".  Chronic L hip/residual limb pain: She does have fractures of L femoral neck (where hardware was removed) as well as distal femur Fx. Pain control per primary.  ESRD: Usual MWF schedule - following Thanksgiving  Holiday Schedule this week (Sun/Tues/Fri) - next HD tomorrow (11/21). Clots HD system without heparin, 4000 unit bolus and 2000 unit mid-run bolus.  Hyperkalemia: Stable on scheduled lokelma 4 days a week.  HTN/volume: BP meds have been tapered down - currently on Coreg 3.125mg  BID only. Large fluids gains between HD which has been discussed extensively- volume status slowly improving. Anemia of ESRD: Hgb 9-10s stable. Last tsat 54%. Continue Aranesp 143mcg weekly.  Secondary HPTH: CorrCa slightly high, not on VDRA. Phos higher today, continue Renvela as binder. Nutrition: Alb low, continue protein supplements.  Renal diet w/fluid restrictions.   AVF dysfunction (resolved): S/p branch ligation 09/19/2021  Dr. Virl Cagey. AVF working well now. DM2: Insulin per primary Dispo: SNF previously recommended, but she declined. Not home HD candidate.  Dialyzed in recliner on 9/4 but signed off early due to pain, has  declined to come in a recliner since then.  Strongly discussed on the importance of increasing her mobility as much as possible while she is in her room in addition to following her ongoing fluid restriction. Also reinforced the importance of being in the chair for HD treatments to improve strength and mobility. Our goal is to dialyze her in the mornings so the patient has more time to work with PT in the afternoons (we will do our best to accommodate this as we still need to always consider the emergent cases).  Encouraged ongoing progress. Per Renal Navigator notes there are no clinics that will accept patient unless she will have HD in chair. Will continue to encourage use of chair for HD  Veneta Penton, Hershal Coria 01/20/2022, 11:20 AM  [employer]

## 2022-01-20 NOTE — Progress Notes (Signed)
PROGRESS NOTE    [name]  URK:[id number] DOB: [birth year] DOA: 08/26/2021 PCP: [name], MD    Brief Narrative:   Patient is 56 year old female with history of hypertension, hyperlipidemia, diabetes mellitus type 2, end-stage renal disease on hemodialysis, status post left BKA for chronic calcaneus osteomyelitis on 01/12/2020, bipolar disorder, left distal femoral fracture after a fall on 07/19/2021 deemed  nonoperative, admission from 08/03/2021-08/23/2021 for left femoral osteomyelitis requiring partial resection of femur and rearrangements of bony fragments with subsequent antibiotic treatment presented to the hospital with left hip bleeding and was found to have subacute osteomyelitis and abscess of left femur.  Orthopedic/ID were consulted and patient was started on broad-spectrum antibiotics.  Underwent multiple I&D's by Dr. [name] along with local tissue rearrangement.  She was also seen by vascular surgery who did  fistula branch ligation.  She has completed 6-week course of daptomycin on 10/09/2021.  She has been unable to complete hemodialysis in chair and outpatient hemodialysis unit has been hard to find.  She is otherwise medically stable for discharge.   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  Active Problems:   ESRD (end stage renal disease) on dialysis ([place])   Type 2 diabetes mellitus with hyperlipidemia (HCC)   History of CVA (cerebrovascular accident)   Essential hypertension   Anxiety and depression   GASTROESOPHAGEAL REFLUX, NO ESOPHAGITIS   TOBACCO DEPENDENCE   Hyponatremia   Obesity (BMI 30.0-34.9)   Unilateral amputation of left foot (HCC)   Wound infection   History of CVA (cerebrovascular accident) without residual deficits   Hyperkalemia   Wound dehiscence, surgical, sequela   Subacute osteomyelitis of left femur with abscess Status post surgical intervention by Dr. [name].  Completed a 6-week course of  daptomycin on 10/09/2021.  Continue muscle relaxant and oxycodone.  Re imaging on 12/02/2021 of left knee showed healing fracture with callus.  Orthopedics Dr [name] has followed and is doing well.  No left hip restrictions at this time.    Chronic pain.   on fentanyl patch and oral oxycodone, muscle relaxant.  Still complains of similar pain.  Currently on increased dose of amitriptyline to 50 mg at night and muscle relaxant every 6 around-the-clock.  Anterior chest wall pain.  Abdominal x-ray was negative. Appears to be costochondritis related.  Continue warm compress.  Continue muscle relaxant and oxycodone. Discontinue diclofenac patch due to ESRD.  Right thigh mild cellulitis Improved, received a course of antibiotic in the past.  Likely from anasarca.    History of left intertrochanteric fracture Left hip hardware removed on 08/10/2021 prior to admission.  Physical therapy working to scoot and mobilize with wheelchair.  Will need at least 4 hours to be able to be considered for outpatient hemodialysis.  End-stage renal disease on hemodialysis Anasarca -Nephrology following.  Continue dialysis as per nephrology.   Strict intake and output charting Daily weights.  Fluid restriction.  On intermittent Lokelma for hyperkalemia.  Latest Potassium of 3.7.  Awaiting for outpatient hemodialysis chair.  Hyponatremia Latest sodium of 127.  Managed with hemodialysis.   Hyperphosphatemia--continue sevelamer.  Anemia of chronic disease Latest hemoglobin  at 10.0.  Continue to monitor.  Diabetes mellitus type 2 with hyperglycemia Continue sliding scale insulin, mealtime insulin and long-acting insulin.  Continue to monitor closely.   Essential hypertension Continue Coreg, hydralazine, Lasix    History of unspecified CVA Hyperlipidemia Continue Lipitor.   GERD -Continue Protonix and Pepcid  Tobacco use Stable at this time.   Anxiety and depression Continue amitriptyline and Cymbalta.     DVT prophylaxis: Place TED hose Start: 09/06/21 1507 SCDs Start: 08/31/21 1058   Code Status:     Code Status: Full Code  Disposition: Uncertain at this time.  Status is: Inpatient  Remains inpatient appropriate because: Pending disposition plan pending tolerance with the dialysis chair.   Family Communication: None at bedside.  Consultants:  Nephrology Vascular surgery Orthopedics Infectious disease  Procedures:  I&D's with tissue arrangement. Fistula branch ligation. Hemodialysis  Antimicrobials:  None currently   Subjective: Today, patient was seen and examined at bedside.  Denies any nausea vomiting fever chills or rigor.  Still complains of a similar pain in her body.    Objective: Vitals:   01/19/22 1645 01/19/22 2038 01/20/22 0500 01/20/22 0950  BP: (!) 162/54 (!) 146/69 (!) 144/65 (!) 174/64  Pulse: 70 73 70 71  Resp: 17 18 18 17   Temp: 98.3 F (36.8 C) 98.9 F (37.2 C) 98.4 F (36.9 C) 98.5 F (36.9 C)  TempSrc:  Oral Oral   SpO2: 99% 99% 98% 99%  Weight:      Height:        Intake/Output Summary (Last 24 hours) at 01/20/2022 1107 Last data filed at 01/20/2022 1102 Gross per 24 hour  Intake 1020 ml  Output 3500 ml  Net -2480 ml    Filed Weights   01/18/22 0544 01/19/22 0800 01/19/22 1232  Weight: 95.7 kg 98.9 kg 94.4 kg    Physical Examination: Body mass index is 29.86 kg/m.   General: Chronically ill-appearing, obese, mildly anxious, not in obvious distress, alert awake and Communicative HENT:   No scleral pallor or icterus noted. Oral mucosa is moist.  Chest: Mild tenderness of the left chest wall.  Decreased breath sounds bilaterally.  CVS: S1 &S2 heard. No murmur.  Regular rate and rhythm. Abdomen: Soft, nontender, nondistended.  Bowel sounds are heard.   Extremities: Left below-knee amputation.  Left upper extremity AV fistula.  Right lower extremity edema.  Psych: Alert, awake and oriented, CNS:  No cranial nerve deficits.  Moving  extremities. Skin: Warm and dry.    Data Reviewed:   CBC: Recent Labs  Lab 01/13/22 2022 01/14/22 0903 01/15/22 1042 01/17/22 1720 01/19/22 0800  WBC 9.7 9.4 7.6 6.3 6.4  HGB 10.1* 9.4* 10.4* 10.2* 10.0*  HCT 31.7* 28.8* 32.0* 31.0* 31.4*  MCV 90.3 88.9 89.4 89.1 91.0  PLT 223 212 216 221 232     Basic Metabolic Panel: Recent Labs  Lab 01/13/22 2022 01/14/22 0902 01/15/22 1043 01/17/22 1720 01/19/22 0800  NA 126* 126* 132* 128* 127*  K 4.9 5.6* 4.2 3.7 4.8  CL 88* 88* 95* 89* 89*  CO2 21* 22 26 25 25   GLUCOSE 164* 195* 157* 178* 207*  BUN 85* 88* 34* 36* 57*  CREATININE 6.60* 6.90* 3.48* 3.28* 4.63*  CALCIUM 8.6* 8.5* 8.5* 8.5* 8.9  PHOS 5.9* 6.5* 3.5 2.9 5.9*     Liver Function Tests: Recent Labs  Lab 01/13/22 2022 01/14/22 0902 01/15/22 1043 01/17/22 1720 01/19/22 0800  ALBUMIN 3.5 3.2* 3.4* 3.5 3.4*      Radiology Studies: No results found.    LOS: 147 days    Flora Lipps, MD [facility] Available via Epic secure chat 7am-7pm After these hours, please refer to coverage provider listed on [email or web address] 01/20/2022, 11:07 AM

## 2022-01-21 DIAGNOSIS — M86252 Subacute osteomyelitis, left femur: Secondary | ICD-10-CM | POA: Diagnosis not present

## 2022-01-21 DIAGNOSIS — T148XXA Other injury of unspecified body region, initial encounter: Secondary | ICD-10-CM | POA: Diagnosis not present

## 2022-01-21 DIAGNOSIS — N186 End stage renal disease: Secondary | ICD-10-CM | POA: Diagnosis not present

## 2022-01-21 DIAGNOSIS — Z8673 Personal history of transient ischemic attack (TIA), and cerebral infarction without residual deficits: Secondary | ICD-10-CM | POA: Diagnosis not present

## 2022-01-21 LAB — GLUCOSE, CAPILLARY
Glucose-Capillary: 116 mg/dL — ABNORMAL HIGH (ref 70–99)
Glucose-Capillary: 216 mg/dL — ABNORMAL HIGH (ref 70–99)
Glucose-Capillary: 229 mg/dL — ABNORMAL HIGH (ref 70–99)
Glucose-Capillary: 263 mg/dL — ABNORMAL HIGH (ref 70–99)

## 2022-01-21 MED ORDER — HEPARIN SODIUM (PORCINE) 1000 UNIT/ML IJ SOLN
INTRAMUSCULAR | Status: AC
  Filled 2022-01-21: qty 6

## 2022-01-21 NOTE — Progress Notes (Signed)
PROGRESS NOTE    SUMNER BOESCH  VQQ:[id number] DOB: 1965/03/13 DOA: 08/26/2021 PCP: [name], MD    Brief Narrative:   Patient is 56 year old female with history of hypertension, hyperlipidemia, diabetes mellitus type 2, end-stage renal disease on hemodialysis, status post left BKA for chronic calcaneus osteomyelitis on 01/12/2020, bipolar disorder, left distal femoral fracture after a fall on 07/19/2021 deemed  nonoperative, admission from 08/03/2021-08/23/2021 for left femoral osteomyelitis requiring partial resection of femur and rearrangements of bony fragments with subsequent antibiotic treatment presented to the hospital with left hip bleeding and was found to have subacute osteomyelitis and abscess of left femur.  Orthopedic/ID were consulted and patient was started on broad-spectrum antibiotics.  Underwent multiple I&D's by Dr. [name] along with local tissue rearrangement.  She was also seen by vascular surgery who did  fistula branch ligation.  She has completed 6-week course of daptomycin on 10/09/2021.  She has been unable to complete hemodialysis in chair and outpatient hemodialysis unit has been hard to find.  She is otherwise medically stable for discharge.   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  Active Problems:   ESRD (end stage renal disease) on dialysis ([name])   Type 2 diabetes mellitus with hyperlipidemia (HCC)   History of CVA (cerebrovascular accident)   Essential hypertension   Anxiety and depression   GASTROESOPHAGEAL REFLUX, NO ESOPHAGITIS   TOBACCO DEPENDENCE   Hyponatremia   Obesity (BMI 30.0-34.9)   Unilateral amputation of left foot (HCC)   Wound infection   History of CVA (cerebrovascular accident) without residual deficits   Hyperkalemia   Wound dehiscence, surgical, sequela   Subacute osteomyelitis of left femur with abscess Status post surgical intervention by Dr. [name].  Completed a 6-week course of  daptomycin on 10/09/2021.  Continue muscle relaxant and oxycodone.  Re imaging on 12/02/2021 of left knee showed healing fracture with callus.  Orthopedics Dr [name] has followed and is doing well.  No left hip restrictions at this time.    Chronic pain.   on fentanyl patch and oral oxycodone, muscle relaxant.  Still complains of similar pain.  on increased dose of amitriptyline to 50 mg at night and muscle relaxant every 6 around-the-clock.  Anterior chest wall pain.  Abdominal x-ray was negative. Appears to be chest wall related.  Continue warm compress.  Continue muscle relaxant and oxycodone.   Right thigh mild cellulitis Improved, received a course of antibiotic in the past.  Likely from anasarca.    History of left intertrochanteric fracture Left hip hardware removed on 08/10/2021 prior to admission.  Physical therapy working to scoot and mobilize with wheelchair.  Will need at least 4 hours to be able to be considered for outpatient hemodialysis.  Currently has not been able to do much including lack of motivation.  End-stage renal disease on hemodialysis Anasarca -Nephrology following.  Continue dialysis as per nephrology.   Strict intake and output charting Daily weights.  Fluid restriction.  On intermittent Lokelma for hyperkalemia.  Latest Potassium of 4.8.Marland Kitchen  Awaiting for outpatient hemodialysis chair.  Hyponatremia Latest sodium of 127.  Managed with hemodialysis.   Hyperphosphatemia--continue sevelamer.  Anemia of chronic disease Latest hemoglobin  at 10.0.  Continue to monitor.  Diabetes mellitus type 2 with hyperglycemia Continue sliding scale insulin, mealtime insulin and long-acting insulin.  Continue to monitor closely.   Essential hypertension Continue Coreg, hydralazine, Lasix.  Latest blood pressure of 127/55   History of unspecified  CVA Hyperlipidemia Continue Lipitor.   GERD -Continue Protonix and Pepcid   Tobacco use Stable at this time.   Anxiety and  depression Continue amitriptyline and Cymbalta.    DVT prophylaxis: Place TED hose Start: 09/06/21 1507 SCDs Start: 08/31/21 1058   Code Status:     Code Status: Full Code  Disposition: Uncertain at this time.  Long stay patient at this time  Status is: Inpatient  Remains inpatient appropriate because: Pending disposition plan pending tolerance with the dialysis chair.   Family Communication: None at bedside.  Consultants:  Nephrology Vascular surgery Orthopedics Infectious disease  Procedures:  I&D's with tissue arrangement. Fistula branch ligation. Hemodialysis  Antimicrobials:  None   Subjective: Today, patient was seen and examined at bedside.  Seen during hemodialysis.  Denies any nausea vomiting but has mild chronic pain.  No fever chills or rigor.    Objective: Vitals:   01/21/22 1015 01/21/22 1045 01/21/22 1115 01/21/22 1145  BP: (!) 170/56 (!) 177/56 (!) 145/57 (!) 151/56  Pulse:  62 60   Resp: 13 14 15 16   Temp:      TempSrc:      SpO2:  100% 98%   Weight:      Height:        Intake/Output Summary (Last 24 hours) at 01/21/2022 1301 Last data filed at 01/21/2022 0750 Gross per 24 hour  Intake 1000 ml  Output 475 ml  Net 525 ml    Filed Weights   01/19/22 0800 01/19/22 1232 01/21/22 0829  Weight: 98.9 kg 94.4 kg 97.8 kg    Physical Examination: Body mass index is 30.94 kg/m.   General: Obese built, chronically ill-appearing, not in obvious distress HENT:   No scleral pallor or icterus noted. Oral mucosa is moist.  Chest:  Clear breath sounds.  Diminished breath sounds bilaterally. No crackles or wheezes.  Left chest wall tenderness CVS: S1 &S2 heard. No murmur.  Regular rate and rhythm. Abdomen: Soft, nontender, nondistended.  Bowel sounds are heard.   Extremities:   Left below-knee amputation, left upper extremity AV fistula, right lower extremity edema. Psych: Alert, awake and oriented, mildly anxious, CNS:  No cranial nerve deficits.   Moving extremities. Skin: Warm and dry.   Data Reviewed:   CBC: Recent Labs  Lab 01/15/22 1042 01/17/22 1720 01/19/22 0800  WBC 7.6 6.3 6.4  HGB 10.4* 10.2* 10.0*  HCT 32.0* 31.0* 31.4*  MCV 89.4 89.1 91.0  PLT 216 221 232     Basic Metabolic Panel: Recent Labs  Lab 01/15/22 1043 01/17/22 1720 01/19/22 0800  NA 132* 128* 127*  K 4.2 3.7 4.8  CL 95* 89* 89*  CO2 26 25 25   GLUCOSE 157* 178* 207*  BUN 34* 36* 57*  CREATININE 3.48* 3.28* 4.63*  CALCIUM 8.5* 8.5* 8.9  PHOS 3.5 2.9 5.9*     Liver Function Tests: Recent Labs  Lab 01/15/22 1043 01/17/22 1720 01/19/22 0800  ALBUMIN 3.4* 3.5 3.4*      Radiology Studies: No results found.    LOS: 148 days    Flora Lipps, MD [facility] Available via Epic secure chat 7am-7pm After these hours, please refer to coverage provider listed on [email or web address] 01/21/2022, 1:01 PM

## 2022-01-21 NOTE — Progress Notes (Signed)
   01/21/22 1310  Vitals  Temp 98.3 F (36.8 C)  Pulse Rate 65  Resp 15  BP (!) 141/58  SpO2 96 %  O2 Device Room Air  Weight 93.3 kg  Type of Weight Post-Dialysis  Oxygen Therapy  Patient Activity (if Appropriate) In bed  Post Treatment  Dialyzer Clearance Lightly streaked  Duration of HD Treatment -hour(s) 4 hour(s)  Liters Processed 84.5  Fluid Removed (mL) 4.5 mL  Tolerated HD Treatment Yes  AVG/AVF Arterial Site Held (minutes) 5 minutes  AVG/AVF Venous Site Held (minutes) 5 minutes   Received patient in bed to unit.  Alert and oriented.  Informed consent signed and in chart.     Patient tolerated well.  Transported back to the room  Alert, without acute distress.  Hand-off given to patient's nurse.   Access used: LUA Access issues: none  Total UF removed: 4.5L Medication(s) given: none    [facility]y T [facility]

## 2022-01-21 NOTE — TOC Progression Note (Signed)
Transition of Care [facility]) - Initial/Assessment Note    Patient Details  Name: [name] MRN: [id number] Date of Birth: [birth year]  Transition of Care [facility]) CM/SW Contact:    Milinda Antis, LCSWA Phone Number: 01/21/2022, 2:55 PM  Clinical Narrative:                 LCSW observed patient returning to room from dialysis in hospital bed.  Patient is still not sitting for dialysis which is a barrier to d/c.  TOC will continue to follow.    Expected Discharge Plan: Skilled Nursing Facility Barriers to Discharge: Continued Medical Work up   Patient Goals and CMS Choice Patient states their goals for this hospitalization and ongoing recovery are:: To return home CMS Medicare.gov Compare Post Acute Care list provided to:: Patient Choice offered to / list presented to : NA  Expected Discharge Plan and Services Expected Discharge Plan: [place]   Discharge Planning Services: CM Consult   Living arrangements for the past 2 months: Single Family Home                                      Prior Living Arrangements/Services Living arrangements for the past 2 months: Single Family Home Lives with:: Adult Children Patient language and need for interpreter reviewed:: Yes Do you feel safe going back to the place where you live?: Yes      Need for Family Participation in Patient Care: Yes (Comment) Care giver support system in place?: Yes (comment) Current home services: DME ([employer], w/c) Criminal Activity/Legal Involvement Pertinent to Current Situation/Hospitalization: No - Comment as needed  Activities of Daily Living Home Assistive Devices/Equipment: Eyeglasses, Wheelchair ADL Screening (condition at time of admission) Patient's cognitive ability adequate to safely complete daily activities?: Yes Is the patient deaf or have difficulty hearing?: No Does the patient have difficulty seeing, even when wearing glasses/contacts?: No Does the patient have  difficulty concentrating, remembering, or making decisions?: No Patient able to express need for assistance with ADLs?: Yes Does the patient have difficulty dressing or bathing?: Yes Independently performs ADLs?: No Communication: Independent Dressing (OT): Independent Is this a change from baseline?: Pre-admission baseline Grooming: Independent Feeding: Independent Bathing: Needs assistance Is this a change from baseline?: Pre-admission baseline Toileting: Needs assistance Is this a change from baseline?: Pre-admission baseline In/Out Bed: Needs assistance, Dependent Is this a change from baseline?: Pre-admission baseline Walks in Home: Dependent Is this a change from baseline?: Pre-admission baseline Does the patient have difficulty walking or climbing stairs?: Yes Weakness of Legs: Both Weakness of Arms/Hands: Both  Permission Sought/Granted Permission sought to share information with : Case Manager, Family Supports Permission granted to share information with : Yes, Verbal Permission Granted              Emotional Assessment Appearance:: Appears stated age Attitude/Demeanor/Rapport: Engaged, Gracious Affect (typically observed): Accepting, Appropriate, Calm, Hopeful Orientation: : Oriented to Self, Oriented to Place, Oriented to  Time, Oriented to Situation Alcohol / Substance Use: Not Applicable Psych Involvement: No (comment)  Admission diagnosis:  Wound infection [T14.8XXA, L08.9] Abscess of left hip [L02.416] Patient Active Problem List   Diagnosis Date Noted   Wound dehiscence, surgical, sequela    Abscess of left hip 08/26/2021   History of CVA (cerebrovascular accident) 80/05/4915   Hardware complicating wound infection ([place])    Subacute osteomyelitis of left femur with abscess  Septic arthritis of hip ([place]) 07/30/2021   Skin abscess L hip 07/28/2021   HCAP (healthcare-associated pneumonia) 07/27/2021   Hyperkalemia 97/28/2060   Metabolic acidemia  15/61/5379   Closed fracture of left distal femur ([place]) 07/22/2021   Type 2 diabetes mellitus with hyperlipidemia ([place]) 07/22/2021   Infection of superficial incisional surgical site after procedure 03/09/2020   Abscess    Hypoglycemia    Essential hypertension    Sleep disturbance    ESRD (end stage renal disease) on dialysis ([place]) 01/24/2020   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[place]) 01/23/2020   Hyponatremia    Constipation    Chronic osteomyelitis involving left ankle and foot ([place])    Ulcer of left foot with necrosis of bone ([place])    Wound infection 12/28/2019   History of [place] amputation of left foot ([place]) 12/25/2017   History of CVA (cerebrovascular accident) without residual deficits 07/04/2017   Cerebral thrombosis with cerebral infarction 04/08/2017   Right sided weakness 04/07/2017   Hyperhidrosis 09/01/2016   Migraine with aura and without status migrainosus, not intractable 07/28/2016   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[place]) 08/04/2014   CKD stage 3 due to type 2 diabetes mellitus ([place]) 06/27/2014   Vitamin D insufficiency 05/08/2014   Obesity (BMI 30.0-34.9) 05/08/2014   Unilateral amputation of left foot ([place]) 05/08/2014   Bursitis of left shoulder 02/14/2014   Neck pain 01/03/2014   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.23) 01/14/2013   Insomnia 08/04/2012   Diabetic neuropathy, painful ([place]) 08/04/2011   Anxiety and depression 05/16/2010   Female stress incontinence 11/01/2007   TOBACCO DEPENDENCE 04/30/2006   GASTROESOPHAGEAL REFLUX, NO ESOPHAGITIS 04/30/2006   Irritable bowel syndrome 04/30/2006   PCP:  [name], MD Pharmacy:   [facility], [place] [address] [place] [address] Phone: [phone] Fax: [phone]  [id number] - [place], [place] [address] [place] [address] Phone: [phone] Fax: [phone]     Social Determinants of Health (SDOH) Interventions    Readmission Risk Interventions    07/26/2021    3:15 PM  Readmission Risk Prevention Plan  Transportation Screening Complete  PCP or Specialist Appt within 3-5 Days Complete  HRI or Home Care Consult Complete  Palliative Care Screening Not Applicable  Medication Review (RN Care Manager) Referral to Pharmacy

## 2022-01-21 NOTE — Progress Notes (Signed)
Mobility Specialist Progress Note:   01/21/22 1620  Mobility  Activity  (daily stretches + HEP per PT)  Assistive Device None  Range of Motion/Exercises Active;Passive;Right leg;Left leg  Activity Response Tolerated well  Mobility Referral Yes  $Mobility charge 1 Mobility   Pt agreeable to only stretching and limited exercises d/t HD today. Continues to endorse significant back pain with any mobility. Pt left with all needs met.   Nelta Numbers Mobility Specialist Please contact via SecureChat or  Rehab office at [phone]

## 2022-01-21 NOTE — Progress Notes (Signed)
PT Cancellation Note  Patient Details Name: RASHEKA DENARD MRN: [id number] DOB: 07-11-1965   Cancelled Treatment:    Reason Eval/Treat Not Completed: Patient at procedure or test/unavailable  Currently in HD; Thanksgiving schedule;   Will follow up later today as time allows;  Otherwise, will follow up for PT tomorrow;   Thank you,  Roney Marion, [place] Office [place] 01/21/2022, 11:17 AM

## 2022-01-21 NOTE — Procedures (Signed)
I was present at this dialysis session. I have reviewed the session itself and made appropriate changes.   Goal UF 4.5L on 2K b ath.  Pt w/o complaints.    Filed Weights   01/19/22 0800 01/19/22 1232 01/21/22 0829  Weight: 98.9 kg 94.4 kg 97.8 kg    Recent Labs  Lab 01/19/22 0800  NA 127*  K 4.8  CL 89*  CO2 25  GLUCOSE 207*  BUN 57*  CREATININE 4.63*  CALCIUM 8.9  PHOS 5.9*    Recent Labs  Lab 01/15/22 1042 01/17/22 1720 01/19/22 0800  WBC 7.6 6.3 6.4  HGB 10.4* 10.2* 10.0*  HCT 32.0* 31.0* 31.4*  MCV 89.4 89.1 91.0  PLT 216 221 232    Scheduled Meds:  (feeding supplement) PROSource Plus  30 mL Oral BID BM   acidophilus  1 capsule Oral Daily   alosetron  1 mg Oral BID   amitriptyline  50 mg Oral QHS   atorvastatin  40 mg Oral q1800   carvedilol  3.125 mg Oral BID   darbepoetin (ARANESP) injection - DIALYSIS  100 mcg Subcutaneous Q Fri-1800   docusate sodium  200 mg Oral BID   DULoxetine  60 mg Oral Daily   famotidine  20 mg Oral Daily   fentaNYL  1 patch Transdermal Q72H   furosemide  80 mg Oral Daily   heparin sodium (porcine)       heparin sodium (porcine)  2,000 Units Intravenous Once   hydrALAZINE  50 mg Oral TID   insulin aspart  0-9 Units Subcutaneous TID WC   insulin aspart  4 Units Subcutaneous TID WC   insulin glargine-yfgn  10 Units Subcutaneous QHS   methocarbamol  500 mg Oral QID   multivitamin  1 tablet Oral QHS   pantoprazole  80 mg Oral Daily   polyethylene glycol  17 g Oral Daily   sevelamer carbonate  2,400 mg Oral TID WC   sodium zirconium cyclosilicate  10 g Oral Once per day on Sun Tue Thu Sat   Continuous Infusions:  albumin human     PRN Meds:.acetaminophen, albumin human, bisacodyl, calcium carbonate, camphor-menthol, heparin sodium (porcine), hydrALAZINE, hydrOXYzine, ipratropium-albuterol, metoprolol tartrate, ondansetron, mouth rinse, oxyCODONE, prochlorperazine, prochlorperazine, senna-docusate   Pearson Grippe   MD 01/21/2022, 9:11 AM

## 2022-01-21 NOTE — Inpatient Diabetes Management (Signed)
Inpatient Diabetes Program Recommendations  AACE/ADA: New Consensus Statement on Inpatient Glycemic Control (2015)  Target Ranges:  Prepandial:   less than 140 mg/dL      Peak postprandial:   less than 180 mg/dL (1-2 hours)      Critically ill patients:  140 - 180 mg/dL   Lab Results  Component Value Date   GLUCAP 116 (H) 01/21/2022   HGBA1C 5.9 (H) 11/14/2021    Review of Glycemic Control  Latest Reference Range & Units 01/20/22 07:27 01/20/22 11:26 01/20/22 16:23 01/20/22 21:07 01/21/22 07:23  Glucose-Capillary 70 - 99 mg/dL 128 (H) 254 (H) 108 (H) 203 (H) 116 (H)  (H): Data is abnormally high  Diabetes history: DM2 Outpatient Diabetes medications: Lantus 10 QD, Novolog 10 TID with meals Current orders for Inpatient glycemic control:  Semglee 10 units QHS Novolog 4 units TID  Novolog 0-9 units TID  Inpatient Diabetes Program Recommendations:    Novolog 6 units TID if consumes at least 50%  Will continue to follow while inpatient.  Thank you, Reche Dixon, MSN, [place] Diabetes Coordinator Inpatient Diabetes Program [phone] (team pager from 8a-5p)

## 2022-01-22 DIAGNOSIS — M86252 Subacute osteomyelitis, left femur: Secondary | ICD-10-CM | POA: Diagnosis not present

## 2022-01-22 LAB — GLUCOSE, CAPILLARY
Glucose-Capillary: 187 mg/dL — ABNORMAL HIGH (ref 70–99)
Glucose-Capillary: 152 mg/dL — ABNORMAL HIGH (ref 70–99)
Glucose-Capillary: 269 mg/dL — ABNORMAL HIGH (ref 70–99)
Glucose-Capillary: 144 mg/dL — ABNORMAL HIGH (ref 70–99)

## 2022-01-22 MED ORDER — INSULIN ASPART 100 UNIT/ML IJ SOLN
6.0000 [IU] | Freq: Three times a day (TID) | INTRAMUSCULAR | Status: DC
  Administered 2022-01-22 – 2022-03-18 (×132): 6 [IU] via SUBCUTANEOUS

## 2022-01-22 NOTE — Progress Notes (Signed)
Mobility Specialist Progress Note:   01/22/22 1620  Mobility  Activity  (daily stretches + exercises per PT)  Assistive Device None  Range of Motion/Exercises Active;Passive;Right leg;Left leg  Activity Response Tolerated fair  Mobility Referral Yes  $Mobility charge 1 Mobility   Pt agreeable to limited stretching + BLE strengthening exercises. Tearful about being in hospital during holidays throughout session. Pt tolerated session well. Left with all needs met.    Nelta Numbers Mobility Specialist Please contact via SecureChat or  Rehab office at [phone]

## 2022-01-22 NOTE — Progress Notes (Signed)
Physical Therapy Treatment Patient Details Name: [name] MRN: [id number] DOB: Oct 25, 1965 Today's Date: 01/22/2022   History of Present Illness 56 y.o. female presented to ED 6/26 from dialysis with increased bloody drainage from L hip wound. 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 s/p hardware removal of left hip with partial resection of tuft tissue and femure that were nonviable on 08/10/21 Discharged home 6/23. underwent L hip debridement and wound vac placement 6/28; +Left intertrochanteric non-union,  Fracture of left distal femur  PMH: hypertension, hyperlipidemia, ESRD on HD MWF, history of left BKA in 2021, depression/anxiety, stroke, tobacco use, T2DM,  insomnia, chronic pain syndrome,    PT Comments    Continuing efforts work on functional mobility and activity tolerance;  Today, Khiley is in too much back pain to participate in any functional mobility beyond repositioning in bed; emphasized the need to be OOB, and to shift towards a more normal-looking day: being up in the morning, OOB for meals;  Also discussed liner, shrinker, and prosthesis wear, and therapeutic exercises to work on;   Noted a rash LLE; Notified Dr. [place] and he will take a look at it; would like the rash to be cleared up more before more extended time wearing the prosthesis;   Discussed Keviana's case with Veneta Penton, Nephrology PA; We have been trying to see her after HD to maximize PT, however she consistently refuses PT after HD, citing having had HD as the reason she can't participate in PT; Will try to shift HD to the afternoons on Mondays to allow for PT in the morning; WE appreciate this effort (and understand when HD schedule makes PM HD Monday not possible)  Recommendations for follow up therapy are one component of a multi-disciplinary discharge planning process, led by the attending physician.  Recommendations may be updated based on patient status, additional  functional criteria and insurance authorization.  Follow Up Recommendations  Skilled nursing-short term rehab (<3 hours/day) Can patient physically be transported by private vehicle: No   Assistance Recommended at Discharge Intermittent Supervision/Assistance  Patient can return home with the following A lot of help with bathing/dressing/bathroom;Assistance with cooking/housework;Assist for transportation;Two people to help with walking and/or transfers;Help with stairs or ramp for entrance   Equipment Recommendations  Other (comment) (Sliding board, drop-arm BSC; needs a wheelchair cushion)    Recommendations for Other Services OT consult     Precautions / Restrictions Precautions Precautions: Fall;Other (comment) Precaution Comments: L BKA (baseline) Restrictions LLE Weight Bearing: Weight bearing as tolerated Other Position/Activity Restrictions: L distal femur fx; per Dr [name] 8/10 pt can full WB; Prosthesis fits     Mobility  Bed Mobility               General bed mobility comments: Declined mobility beyond positioning in bed    Transfers                   General transfer comment: Declined mobility beyond positioning in bed    Ambulation/Gait                   Stairs             Wheelchair Mobility    Modified Rankin (Stroke Patients Only)       Balance  Cognition Arousal/Alertness: Awake/alert Behavior During Therapy: WFL for tasks assessed/performed (Though frustrated) Overall Cognitive Status: Within Functional Limits for tasks assessed                                          Exercises Other Exercises Other Exercises: Positioned bolster at lateral L knee/thigh and medial residual limb/calf for gentle stretch Other Exercises: demonstrated seated hip abduction exercise with strap around knees (pt's daughter performed the exercise)    General  Comments General comments (skin integrity, edema, etc.): Noting a rash on pt's residual limb; I'm concerned that she msy be over-wearing the liner; Notified Dr. [place] re: rash; Reiterated to pt that she is to wear the liner during the day and the shrinker overnight      Pertinent Vitals/Pain Pain Assessment Pain Assessment: Faces Faces Pain Scale: Hurts little more Pain Location: Back pain limiting ability to participate Pain Descriptors / Indicators: Discomfort, Grimacing, Guarding Pain Intervention(s): Limited activity within patient's tolerance    Home Living                          Prior Function            PT Goals (current goals can now be found in the care plan section) Acute Rehab PT Goals Patient Stated Goal: less pain with movement; wants to walk again PT Goal Formulation: With patient Time For Goal Achievement: 01/23/22 Potential to Achieve Goals: Fair Progress towards PT goals: Not progressing toward goals - comment (Limited by pain)    Frequency    Min 2X/week      PT Plan Current plan remains appropriate    Co-evaluation              AM-PAC PT "6 Clicks" Mobility   Outcome Measure  Help needed turning from your back to your side while in a flat bed without using bedrails?: None Help needed moving from lying on your back to sitting on the side of a flat bed without using bedrails?: A Little Help needed moving to and from a bed to a chair (including a wheelchair)?: A Little Help needed standing up from a chair using your arms (e.g., wheelchair or bedside chair)?: Total Help needed to walk in hospital room?: Total Help needed climbing 3-5 steps with a railing? : Total 6 Click Score: 13    End of Session   Activity Tolerance: Patient limited by pain Patient left: in bed;with call bell/phone within reach Nurse Communication: Mobility status PT Visit Diagnosis: Other abnormalities of gait and mobility (R26.89);Muscle weakness  (generalized) (M62.81);History of falling (Z91.81);Pain Pain - Right/Left: Left Pain - part of body: Leg     Time: 1212-1229 PT Time Calculation (min) (ACUTE ONLY): 17 min  Charges:  $Self Care/Home Management: [place], [place] Office [phone]    Colletta [place] 01/22/2022, 3:03 PM

## 2022-01-22 NOTE — Progress Notes (Signed)
[facility] Progress Note   Subjective:  Seen in room - no issues. Dialyzed yesterday with 4.5L off - did fine.  Objective Vitals:   01/21/22 1600 01/21/22 2028 01/22/22 0519 01/22/22 0810  BP: (!) 147/51 (!) 153/53 (!) 149/53 (!) 155/60  Pulse: 66 72 60 64  Resp: 20 20  17   Temp: 98.6 F (37 C) 98.1 F (36.7 C) 98 F (36.7 C) 98.4 F (36.9 C)  TempSrc: Oral Oral Oral Oral  SpO2: 100% 98% 100% 99%  Weight:      Height:       Physical Exam General: Well appearing woman, NAD. Room air. Heart: RRR; no murmur Lungs: CTAB; no rales Abdomen: soft Extremities: RLE and L BKA with 1+ edema; L stump shrinker in place Dialysis Access: LUE AVF + bruit  Additional Objective Labs: Basic Metabolic Panel: Recent Labs  Lab 01/17/22 1720 01/19/22 0800  NA 128* 127*  K 3.7 4.8  CL 89* 89*  CO2 25 25  GLUCOSE 178* 207*  BUN 36* 57*  CREATININE 3.28* 4.63*  CALCIUM 8.5* 8.9  PHOS 2.9 5.9*   Liver Function Tests: Recent Labs  Lab 01/17/22 1720 01/19/22 0800  ALBUMIN 3.5 3.4*   CBC: Recent Labs  Lab 01/17/22 1720 01/19/22 0800  WBC 6.3 6.4  HGB 10.2* 10.0*  HCT 31.0* 31.4*  MCV 89.1 91.0  PLT 221 232   Blood Culture    Component Value Date/Time   SDES TISSUE 08/28/2021 0917   SPECREQUEST LEFT THIGH TISSUE 08/28/2021 0917   CULT  08/28/2021 0917    MODERATE ENTEROCOCCUS FAECIUM VANCOMYCIN RESISTANT ENTEROCOCCUS ISOLATED NO ANAEROBES ISOLATED Sent to [place] for further susceptibility testing. Performed at [facility], [place] [address], [place], [place] [address]    REPTSTATUS 09/02/2021 FINAL 08/28/2021 7616   Medications:  albumin human      (feeding supplement) PROSource Plus  30 mL Oral BID BM   acidophilus  1 capsule Oral Daily   alosetron  1 mg Oral BID   amitriptyline  50 mg Oral QHS   atorvastatin  40 mg Oral q1800   carvedilol  3.125 mg Oral BID   darbepoetin (ARANESP) injection - DIALYSIS  100 mcg Subcutaneous Q Fri-1800    docusate sodium  200 mg Oral BID   DULoxetine  60 mg Oral Daily   famotidine  20 mg Oral Daily   fentaNYL  1 patch Transdermal Q72H   furosemide  80 mg Oral Daily   heparin sodium (porcine)  2,000 Units Intravenous Once   hydrALAZINE  50 mg Oral TID   insulin aspart  0-9 Units Subcutaneous TID WC   insulin aspart  6 Units Subcutaneous TID WC   insulin glargine-yfgn  10 Units Subcutaneous QHS   methocarbamol  500 mg Oral QID   multivitamin  1 tablet Oral QHS   pantoprazole  80 mg Oral Daily   polyethylene glycol  17 g Oral Daily   sevelamer carbonate  2,400 mg Oral TID WC   sodium zirconium cyclosilicate  10 g Oral Once per day on Sun Tue Thu Sat    Dialysis Orders: MWF at [facility], 400/500, EDW 90kg, 2K/2Ca, LUE AVF - heparin 4000 unit IV bolus, no ESA     Assessment/Plan: 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 Completed 6 wk course of IV Daptomycin on 10/12/21. Per Dr. Jess Barters note 12/28/2021 "NO RESTRICTIONS L HIP".  Chronic L hip/residual limb pain: She does  have fractures of L femoral neck (where hardware was removed) as well as distal femur Fx. Pain control per primary.  ESRD: Usual MWF schedule - following Thanksgiving Holiday Schedule this week (Sun/Tues/Fri) - next HD 11/24. Clots HD system without heparin, 4000 unit bolus and 2000 unit mid-run bolus.  Hyperkalemia: Stable on scheduled lokelma 4 days a week.  HTN/volume: BP meds have been tapered down - currently on Coreg 3.125mg  BID only. Large fluids gains between HD which has been discussed extensively, edema ^ again - going for max UF 4.5L q HD. Anemia of ESRD: Hgb 9-10s stable. Last tsat 54%. Continue Aranesp 168mcg weekly.  Secondary HPTH: CorrCa slightly high, not on VDRA. Phos higher today, continue Renvela as binder. Nutrition: Alb low, continue protein supplements.  Renal diet w/fluid restrictions.   AVF dysfunction (resolved): S/p branch ligation 09/19/2021  Dr.  Virl Cagey. AVF working well now. DM2: Insulin per primary Dispo: SNF previously recommended, but she declined. Not home HD candidate.  Dialyzed in recliner on 9/4 but signed off early due to pain, has declined to come in a recliner since then.  Strongly discussed on the importance of increasing her mobility as much as possible while she is in her room in addition to following her ongoing fluid restriction. Also reinforced the importance of being in the chair for HD treatments to improve strength and mobility. Our goal is to dialyze her in the mornings so the patient has more time to work with PT in the afternoons (we will do our best to accommodate this as we still need to always consider the emergent cases).  Encouraged ongoing progress. Per Renal Navigator notes there are no clinics that will accept patient unless she will have HD in chair. Will continue to encourage use of chair for [employer], PA-C 01/22/2022, 11:21 AM  [employer]

## 2022-01-22 NOTE — Progress Notes (Signed)
PROGRESS NOTE    [name]  KZS:[id number] DOB: August 15, 1965 DOA: 08/26/2021 PCP: [name], MD    Brief Narrative:   [name] is a 56 y.o. female with past medical history significant for hypertension, hyperlipidemia, diabetes mellitus type 2, end-stage renal disease on hemodialysis, bipolar disorder, status post left BKA for chronic calcaneus osteomyelitis on 01/12/2020, left distal femoral fracture after a fall on 07/19/2021 deemed nonoperative, admission from 08/03/2021-08/23/2021 for left femoral osteomyelitis requiring partial resection of femur and rearrangements of bony fragments with subsequent antibiotic treatment presented to the hospital with left hip bleeding and was found to have subacute osteomyelitis and abscess of left femur.  Orthopedic/ID were consulted and patient was started on broad-spectrum antibiotics.  Underwent multiple I&D's by Dr. [name] along with local tissue rearrangement.  She was also seen by vascular surgery who did  fistula branch ligation.  She has completed 6-week course of daptomycin on 10/09/2021.    She has been unable to complete hemodialysis in chair and outpatient hemodialysis unit has been hard to find.  She is otherwise medically stable for discharge.   Assessment & Plan:   Subacute osteomyelitis of left femur with abscess Status post surgical intervention by Dr. [name] with multiple left hip debridements last on 08/31/2021.  Completed a 6-week course of daptomycin on 10/11/2021.  Repeat imaging on 12/02/2021 of left knee showed healing fracture with callus.  Orthopedics Dr [name] has followed and is doing well.  No left hip restrictions at this time.   --Fentanyl patch 12 mcg every 72 hours --Oxycodone 500 mg p.o. every 4 hours as needed moderate pain --Robaxin 5 mg p.o. 4 times daily   Chronic pain.    --Fentanyl patch 12 mcg every 72 hours  --Oxycodone 5 mg PO q4h PRN moderate pain --Robaxin 500 mg p.o. 4 times daily --Cymbalta 60 mg p.o.  daily --Amitriptyline 50 mg p.o. nightly  Anterior chest wall pain.   Abdominal x-ray was negative. Appears to be chest wall related.  Continue warm compress.  Continue muscle relaxant and oxycodone.    Right thigh mild cellulitis Improved, received a course of antibiotic in the past.  Likely from anasarca.     History of left intertrochanteric fracture Left hip hardware removed on 08/10/2021 prior to admission.  Physical therapy working to scoot and mobilize with wheelchair.  Will need at least 4 hours to be able to be considered for outpatient hemodialysis.  Currently has not been able to do much including lack of motivation.   End-stage renal disease on hemodialysis Anasarca --Nephrology following; appreciate assistance --Renal diet with 1.2 L fluid restriction --continue dialysis as per nephrology.    --Strict intake and output, daily weights --intermittent Lokelma for hyperkalemia, last Potassium  4.8 on 11/19 --Awaiting outpatient hemodialysis chair.   Hyponatremia Latest sodium of 127.  Managed with hemodialysis.   Hyperphosphatemia --continue sevelamer.   Anemia of chronic disease Latest hemoglobin  at 10.0.  Continue to monitor.   Diabetes mellitus type 2 with hyperglycemia Hemoglobin A1c 5.9 on 11/14/2021. --Semglee 10u [place] daily --Novolog 6u TIDAC --Sensitive SSI for coverage --CBGs qAC/HS  Essential hypertension --Coreg 3.125mg  PO BID --Hydralazine 50mg  PO TID --Lasix 80mg  PO daily  History of unspecified CVA Hyperlipidemia Continue Lipitor 40mg  PO daily   GERD --Continue Protonix 80mg  PO daily and Pepcid 20mg  PO daily   Tobacco use Counseled on need for complete cessation   Anxiety and depression --Continue amitriptyline 50mg  PO qHS and Cymbalta 60mg  PO daily.  DVT prophylaxis: Place TED hose Start: 09/06/21 1507 SCDs Start: 08/31/21 1058    Code Status: Full Code Family Communication: No family present at bedside this morning  Disposition  Plan:  Level of care: Med-Surg Status is: Inpatient Remains inpatient appropriate because: Needs HD seat set up before stable for discharge    Consultants:  Nephrology Vascular surgery Orthopedics Infectious disease  Procedures:  I&D's with tissue arrangement. Fistula branch ligation.   Antimicrobials:  Ceftriaxone 10/15 - 10/18 Doxycycline 10/14 - 10/14 Cefazolin 7/1 - 7/1, 7/20 - 7/20 Daptomycin 6/29 - 8/11   Subjective: Patient seen examined bedside, resting comfortably.  Lying in bed.  Asking for yogurt to be added to diet.  Underwent hemodialysis yesterday with no issue.  No other complaints or concerns at this time.  Denies headache, no dizziness, no chest pain, no palpitations, no shortness of breath, no abdominal pain, no fever/chills/night sweats, no nausea/vomiting/diarrhea, no focal weakness, no fatigue, no paresthesias.  No acute events overnight per nursing staff.  Objective: Vitals:   01/21/22 1600 01/21/22 2028 01/22/22 0519 01/22/22 0810  BP: (!) 147/51 (!) 153/53 (!) 149/53 (!) 155/60  Pulse: 66 72 60 64  Resp: 20 20  17   Temp: 98.6 F (37 C) 98.1 F (36.7 C) 98 F (36.7 C) 98.4 F (36.9 C)  TempSrc: Oral Oral Oral Oral  SpO2: 100% 98% 100% 99%  Weight:      Height:        Intake/Output Summary (Last 24 hours) at 01/22/2022 1105 Last data filed at 01/22/2022 0800 Gross per 24 hour  Intake 1040 ml  Output 104.5 ml  Net 935.5 ml   Filed Weights   01/19/22 1232 01/21/22 0829 01/21/22 1310  Weight: 94.4 kg 97.8 kg 93.3 kg    Examination:  Physical Exam: GEN: NAD, alert and oriented x 3, chronically ill appearance, appears older than stated age HEENT: NCAT, PERRL, EOMI, sclera clear, MMM PULM: CTAB w/o wheezes/crackles, normal respiratory effort CV: RRR w/o M/G/R GI: abd soft, NTND, NABS, no R/G/M MSK: Left BKA noted, left upper extremity AV fistula noted, moves all extremities independently NEURO: CN II-XII intact, no focal deficits,  sensation to light touch intact PSYCH: normal mood/affect Integumentary: dry/intact, no rashes or wounds    Data Reviewed: I have personally reviewed following labs and imaging studies  CBC: Recent Labs  Lab 01/17/22 1720 01/19/22 0800  WBC 6.3 6.4  HGB 10.2* 10.0*  HCT 31.0* 31.4*  MCV 89.1 91.0  PLT 221 637   Basic Metabolic Panel: Recent Labs  Lab 01/17/22 1720 01/19/22 0800  NA 128* 127*  K 3.7 4.8  CL 89* 89*  CO2 25 25  GLUCOSE 178* 207*  BUN 36* 57*  CREATININE 3.28* 4.63*  CALCIUM 8.5* 8.9  PHOS 2.9 5.9*   GFR: Estimated Creatinine Clearance: 17.2 mL/min (A) (by C-G formula based on SCr of 4.63 mg/dL (H)). Liver Function Tests: Recent Labs  Lab 01/17/22 1720 01/19/22 0800  ALBUMIN 3.5 3.4*   No results for input(s): "LIPASE", "AMYLASE" in the last 168 hours. No results for input(s): "AMMONIA" in the last 168 hours. Coagulation Profile: No results for input(s): "INR", "PROTIME" in the last 168 hours. Cardiac Enzymes: No results for input(s): "CKTOTAL", "CKMB", "CKMBINDEX", "TROPONINI" in the last 168 hours. BNP (last 3 results) No results for input(s): "PROBNP" in the last 8760 hours. HbA1C: No results for input(s): "HGBA1C" in the last 72 hours. CBG: Recent Labs  Lab 01/21/22 0723 01/21/22 1433 01/21/22 1721 01/21/22  2031 01/22/22 0718  GLUCAP 116* 216* 229* 263* 187*   Lipid Profile: No results for input(s): "CHOL", "HDL", "LDLCALC", "TRIG", "CHOLHDL", "LDLDIRECT" in the last 72 hours. Thyroid Function Tests: No results for input(s): "TSH", "T4TOTAL", "FREET4", "T3FREE", "THYROIDAB" in the last 72 hours. Anemia Panel: No results for input(s): "VITAMINB12", "FOLATE", "FERRITIN", "TIBC", "IRON", "RETICCTPCT" in the last 72 hours. Sepsis Labs: No results for input(s): "PROCALCITON", "LATICACIDVEN" in the last 168 hours.  No results found for this or any previous visit (from the past 240 hour(s)).       Radiology Studies: No results  found.      Scheduled Meds:  (feeding supplement) PROSource Plus  30 mL Oral BID BM   acidophilus  1 capsule Oral Daily   alosetron  1 mg Oral BID   amitriptyline  50 mg Oral QHS   atorvastatin  40 mg Oral q1800   carvedilol  3.125 mg Oral BID   darbepoetin (ARANESP) injection - DIALYSIS  100 mcg Subcutaneous Q Fri-1800   docusate sodium  200 mg Oral BID   DULoxetine  60 mg Oral Daily   famotidine  20 mg Oral Daily   fentaNYL  1 patch Transdermal Q72H   furosemide  80 mg Oral Daily   heparin sodium (porcine)  2,000 Units Intravenous Once   hydrALAZINE  50 mg Oral TID   insulin aspart  0-9 Units Subcutaneous TID WC   insulin aspart  6 Units Subcutaneous TID WC   insulin glargine-yfgn  10 Units Subcutaneous QHS   methocarbamol  500 mg Oral QID   multivitamin  1 tablet Oral QHS   pantoprazole  80 mg Oral Daily   polyethylene glycol  17 g Oral Daily   sevelamer carbonate  2,400 mg Oral TID WC   sodium zirconium cyclosilicate  10 g Oral Once per day on Sun Tue Thu Sat   Continuous Infusions:  albumin human       LOS: 149 days    Time spent: 48 minutes spent on chart review, discussion with nursing staff, consultants, updating family and interview/physical exam; more than 50% of that time was spent in counseling and/or coordination of care.    Alzora Ha J [place] DO [facility] Available via Epic secure chat 7am-7pm After these hours, please refer to coverage provider listed on [email or web address] 01/22/2022, 11:05 AM

## 2022-01-23 DIAGNOSIS — M86252 Subacute osteomyelitis, left femur: Secondary | ICD-10-CM | POA: Diagnosis not present

## 2022-01-23 LAB — GLUCOSE, CAPILLARY
Glucose-Capillary: 194 mg/dL — ABNORMAL HIGH (ref 70–99)
Glucose-Capillary: 198 mg/dL — ABNORMAL HIGH (ref 70–99)
Glucose-Capillary: 132 mg/dL — ABNORMAL HIGH (ref 70–99)
Glucose-Capillary: 142 mg/dL — ABNORMAL HIGH (ref 70–99)

## 2022-01-23 MED ORDER — CHLORHEXIDINE GLUCONATE CLOTH 2 % EX PADS
6.0000 | MEDICATED_PAD | Freq: Every day | CUTANEOUS | Status: DC
  Administered 2022-01-23: 6 via TOPICAL

## 2022-01-23 MED ORDER — CLOTRIMAZOLE 1 % EX CREA
TOPICAL_CREAM | Freq: Two times a day (BID) | CUTANEOUS | Status: DC
  Filled 2022-01-23 (×2): qty 15

## 2022-01-23 NOTE — Progress Notes (Signed)
[facility] Progress Note   Subjective:   No new concerns today. Discussed goal of being home by Christmas, encouraged to work with PT. Denies SOB, CP, dizziness and nausea.   Objective Vitals:   01/22/22 0810 01/22/22 1608 01/22/22 2136 01/23/22 0603  BP: (!) 155/60 (!) 157/68 (!) 154/51 (!) 165/55  Pulse: 64 67 63 (!) 58  Resp: 17 19 18 16   Temp: 98.4 F (36.9 C) 98.7 F (37.1 C) 98 F (36.7 C) 98.2 F (36.8 C)  TempSrc: Oral Oral Oral Oral  SpO2: 99% 99% 100% 100%  Weight:      Height:       Physical Exam General: [name] Heart: RRR, no murmurs, rubs or gallops Lungs: CTA bilaterally without wheezing, rhonchi or rales Abdomen: Soft, non-distended, +BS Extremities: L BKA with 1+ edema, RLE trace edema Dialysis Access: LUE AVF + bruit  Additional Objective Labs: Basic Metabolic Panel: Recent Labs  Lab 01/17/22 1720 01/19/22 0800  NA 128* 127*  K 3.7 4.8  CL 89* 89*  CO2 25 25  GLUCOSE 178* 207*  BUN 36* 57*  CREATININE 3.28* 4.63*  CALCIUM 8.5* 8.9  PHOS 2.9 5.9*   Liver Function Tests: Recent Labs  Lab 01/17/22 1720 01/19/22 0800  ALBUMIN 3.5 3.4*   No results for input(s): "LIPASE", "AMYLASE" in the last 168 hours. CBC: Recent Labs  Lab 01/17/22 1720 01/19/22 0800  WBC 6.3 6.4  HGB 10.2* 10.0*  HCT 31.0* 31.4*  MCV 89.1 91.0  PLT 221 232   Blood Culture    Component Value Date/Time   SDES TISSUE 08/28/2021 0917   SPECREQUEST LEFT THIGH TISSUE 08/28/2021 0917   CULT  08/28/2021 0917    MODERATE ENTEROCOCCUS FAECIUM VANCOMYCIN RESISTANT ENTEROCOCCUS ISOLATED NO ANAEROBES ISOLATED Sent to [place] for further susceptibility testing. Performed at [facility], [place] [address] [place], [place] [address]    REPTSTATUS 09/02/2021 FINAL 08/28/2021 8921    Cardiac Enzymes: No results for input(s): "CKTOTAL", "CKMB", "CKMBINDEX", "TROPONINI" in the last 168 hours. CBG: Recent Labs  Lab 01/22/22 0718  01/22/22 1105 01/22/22 1608 01/22/22 2018 01/23/22 0737  GLUCAP 187* 152* 269* 144* 194*   Iron Studies: No results for input(s): "IRON", "TIBC", "TRANSFERRIN", "FERRITIN" in the last 72 hours. @lablastinr3 @ Studies/Results: No results found. Medications:  albumin human      (feeding supplement) PROSource Plus  30 mL Oral BID BM   acidophilus  1 capsule Oral Daily   alosetron  1 mg Oral BID   amitriptyline  50 mg Oral QHS   atorvastatin  40 mg Oral q1800   carvedilol  3.125 mg Oral BID   darbepoetin (ARANESP) injection - DIALYSIS  100 mcg Subcutaneous Q Fri-1800   docusate sodium  200 mg Oral BID   DULoxetine  60 mg Oral Daily   famotidine  20 mg Oral Daily   fentaNYL  1 patch Transdermal Q72H   furosemide  80 mg Oral Daily   heparin sodium (porcine)  2,000 Units Intravenous Once   hydrALAZINE  50 mg Oral TID   insulin aspart  0-9 Units Subcutaneous TID WC   insulin aspart  6 Units Subcutaneous TID WC   insulin glargine-yfgn  10 Units Subcutaneous QHS   methocarbamol  500 mg Oral QID   multivitamin  1 tablet Oral QHS   pantoprazole  80 mg Oral Daily   polyethylene glycol  17 g Oral Daily   sevelamer carbonate  2,400 mg Oral TID  WC   sodium zirconium cyclosilicate  10 g Oral Once per day on Sun Tue Thu Sat    Dialysis Orders: MWF at [facility], 400/500, EDW 90kg, 2K/2Ca, LUE AVF - heparin 4000 unit IV bolus, no ESA  Assessment/Plan: 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 Completed 6 wk course of IV Daptomycin on 10/12/21. Per Dr. Jess Barters note 12/28/2021 "NO RESTRICTIONS L HIP".  Chronic L hip/residual limb pain: She does have fractures of L femoral neck (where hardware was removed) as well as distal femur Fx. Pain control per primary.  ESRD: Usual MWF schedule - following Thanksgiving Holiday Schedule this week (Sun/Tues/Fri) - next HD 11/24. Clots HD system without heparin, 4000 unit bolus and 2000 unit mid-run bolus.   Hyperkalemia: Stable on scheduled lokelma 4 days a week.  HTN/volume: BP meds have been tapered down - currently on Coreg 3.125mg  BID only. Large fluids gains between HD which has been discussed extensively, edema ^ again - going for max UF 4.5L q HD. Anemia of ESRD: Hgb 9-10s stable. Last tsat 54%. Continue Aranesp 145mcg weekly.  Secondary HPTH: CorrCa slightly high, not on VDRA. Phos higher today, continue Renvela as binder. Nutrition: Alb low, continue protein supplements.  Renal diet w/fluid restrictions.   AVF dysfunction (resolved): S/p branch ligation 09/19/2021  Dr. Virl Cagey. AVF working well now. DM2: Insulin per primary Dispo: SNF previously recommended, but she declined. Not home HD candidate.  Dialyzed in recliner on 9/4 but signed off early due to pain, has declined to come in a recliner since then.  Strongly discussed on the importance of increasing her mobility as much as possible while she is in her room in addition to following her ongoing fluid restriction. Also reinforced the importance of being in the chair for HD treatments to improve strength and mobility. Does not like to do PT after HD, will try to do HD in the afternoons so she can do PT in the AM. Per Renal Navigator notes there are no clinics that will accept patient unless she will have HD in chair. Will continue to encourage use of chair for [place], PA-C 01/23/2022, 11:00 AM  [facility] Pager: [phone]

## 2022-01-23 NOTE — Progress Notes (Signed)
PROGRESS NOTE    [name]  QMV:[id number] DOB: 01/13/1966 DOA: 08/26/2021 PCP: [name], MD    Brief Narrative:   [name] is a 56 y.o. female with past medical history significant for hypertension, hyperlipidemia, diabetes mellitus type 2, end-stage renal disease on hemodialysis, bipolar disorder, status post left BKA for chronic calcaneus osteomyelitis on 01/12/2020, left distal femoral fracture after a fall on 07/19/2021 deemed nonoperative, admission from 08/03/2021-08/23/2021 for left femoral osteomyelitis requiring partial resection of femur and rearrangements of bony fragments with subsequent antibiotic treatment presented to the hospital with left hip bleeding and was found to have subacute osteomyelitis and abscess of left femur.  Orthopedic/ID were consulted and patient was started on broad-spectrum antibiotics.  Underwent multiple I&D's by Dr. [name] along with local tissue rearrangement.  She was also seen by vascular surgery who did  fistula branch ligation.  She has completed 6-week course of daptomycin on 10/09/2021.    She has been unable to complete hemodialysis in chair and outpatient hemodialysis unit has been hard to find.  She is otherwise medically stable for discharge.   Assessment & Plan:   Subacute osteomyelitis of left femur with abscess Status post surgical intervention by Dr. [name] with multiple left hip debridements last on 08/31/2021.  Completed a 6-week course of daptomycin on 10/11/2021.  Repeat imaging on 12/02/2021 of left knee showed healing fracture with callus.  Orthopedics Dr [name] has followed and is doing well.  No left hip restrictions at this time.   --Fentanyl patch 12 mcg every 72 hours --Oxycodone 500 mg p.o. every 4 hours as needed moderate pain --Robaxin 5 mg p.o. 4 times daily   Chronic pain.    --Fentanyl patch 12 mcg every 72 hours  --Oxycodone 5 mg PO q4h PRN moderate pain --Robaxin 500 mg p.o. 4 times daily --Cymbalta 60 mg p.o.  daily --Amitriptyline 50 mg p.o. nightly  Anterior chest wall pain.   Abdominal x-ray was negative. Appears to be chest wall related.  Continue warm compress.  Continue muscle relaxant and oxycodone.    Right thigh mild cellulitis: resolved Improved, received a course of antibiotic in the past.  Likely from anasarca.     History of left intertrochanteric fracture Left hip hardware removed on 08/10/2021 prior to admission.  Physical therapy working to scoot and mobilize with wheelchair.  Will need at least 4 hours to be able to be considered for outpatient hemodialysis.  Currently has not been able to do much including lack of motivation.   End-stage renal disease on hemodialysis Anasarca --Nephrology following; appreciate assistance --Renal diet with 1.2 L fluid restriction --continue dialysis as per nephrology.    --Strict intake and output, daily weights --intermittent Lokelma for hyperkalemia, last Potassium  4.8 on 11/19 --Awaiting outpatient hemodialysis chair.   Hyponatremia Latest sodium of 127.  Managed with hemodialysis.   Hyperphosphatemia --continue sevelamer.   Anemia of chronic disease Latest hemoglobin  at 10.0.  Continue to monitor.   Diabetes mellitus type 2 with hyperglycemia Hemoglobin A1c 5.9 on 11/14/2021. --Semglee 10u [place] daily --Novolog 6u TIDAC --Sensitive SSI for coverage --CBGs qAC/HS  Essential hypertension --Coreg 3.125mg  PO BID --Hydralazine 50mg  PO TID --Lasix 80mg  PO daily  History of unspecified CVA Hyperlipidemia Continue Lipitor 40mg  PO daily   GERD --Continue Protonix 80mg  PO daily and Pepcid 20mg  PO daily   Tobacco use Counseled on need for complete cessation   Anxiety and depression --Continue amitriptyline 50mg  PO qHS and Cymbalta 60mg  PO daily.  Fungal rash left stump Patient reports was wearing her stump sock for several days without changing now with slightly irritated pruritic rash to left stump.  No concerning areas of  fluctuance/erythema. --Clotrimazole topically twice daily x 7 days   DVT prophylaxis: Place TED hose Start: 09/06/21 1507 SCDs Start: 08/31/21 1058    Code Status: Full Code Family Communication: No family present at bedside this morning  Disposition Plan:  Level of care: Med-Surg Status is: Inpatient Remains inpatient appropriate because: Needs HD seat set up before stable for discharge    Consultants:  Nephrology Vascular surgery Orthopedics Infectious disease  Procedures:  I&D's with tissue arrangement. Fistula branch ligation.   Antimicrobials:  Ceftriaxone 10/15 - 10/18 Doxycycline 10/14 - 10/14 Cefazolin 7/1 - 7/1, 7/20 - 7/20 Daptomycin 6/29 - 8/11   Subjective: Patient seen examined bedside, resting comfortably.  Lying in bed.  No specific complaints this morning.  Received message from therapy regarding rash to her left stump, appears fungal in nature will start topical medication.  Plan for next hemodialysis tomorrow.  Denies headache, no dizziness, no chest pain, no palpitations, no shortness of breath, no abdominal pain, no fever/chills/night sweats, no nausea/vomiting/diarrhea, no focal weakness, no fatigue, no paresthesias.  No acute events overnight per nursing staff.  Objective: Vitals:   01/22/22 0810 01/22/22 1608 01/22/22 2136 01/23/22 0603  BP: (!) 155/60 (!) 157/68 (!) 154/51 (!) 165/55  Pulse: 64 67 63 (!) 58  Resp: 17 19 18 16   Temp: 98.4 F (36.9 C) 98.7 F (37.1 C) 98 F (36.7 C) 98.2 F (36.8 C)  TempSrc: Oral Oral Oral Oral  SpO2: 99% 99% 100% 100%  Weight:      Height:        Intake/Output Summary (Last 24 hours) at 01/23/2022 1112 Last data filed at 01/22/2022 1700 Gross per 24 hour  Intake 560 ml  Output --  Net 560 ml   Filed Weights   01/19/22 1232 01/21/22 0829 01/21/22 1310  Weight: 94.4 kg 97.8 kg 93.3 kg    Examination:  Physical Exam: GEN: NAD, alert and oriented x 3, chronically ill appearance, appears older  than stated age HEENT: NCAT, PERRL, EOMI, sclera clear, MMM PULM: CTAB w/o wheezes/crackles, normal respiratory effort CV: RRR w/o M/G/R GI: abd soft, NTND, NABS, no R/G/M MSK: Left BKA noted, left upper extremity AV fistula noted, moves all extremities independently NEURO: CN II-XII intact, no focal deficits, sensation to light touch intact PSYCH: normal mood/affect Integumentary: Lacy rash noted to left thumb, otherwise no other concerning rashes/lesions/wounds noted on exposed skin surfaces.     Data Reviewed: I have personally reviewed following labs and imaging studies  CBC: Recent Labs  Lab 01/17/22 1720 01/19/22 0800  WBC 6.3 6.4  HGB 10.2* 10.0*  HCT 31.0* 31.4*  MCV 89.1 91.0  PLT 221 283   Basic Metabolic Panel: Recent Labs  Lab 01/17/22 1720 01/19/22 0800  NA 128* 127*  K 3.7 4.8  CL 89* 89*  CO2 25 25  GLUCOSE 178* 207*  BUN 36* 57*  CREATININE 3.28* 4.63*  CALCIUM 8.5* 8.9  PHOS 2.9 5.9*   GFR: Estimated Creatinine Clearance: 17.2 mL/min (A) (by C-G formula based on SCr of 4.63 mg/dL (H)). Liver Function Tests: Recent Labs  Lab 01/17/22 1720 01/19/22 0800  ALBUMIN 3.5 3.4*   No results for input(s): "LIPASE", "AMYLASE" in the last 168 hours. No results for input(s): "AMMONIA" in the last 168 hours. Coagulation Profile: No results for input(s): "INR", "  PROTIME" in the last 168 hours. Cardiac Enzymes: No results for input(s): "CKTOTAL", "CKMB", "CKMBINDEX", "TROPONINI" in the last 168 hours. BNP (last 3 results) No results for input(s): "PROBNP" in the last 8760 hours. HbA1C: No results for input(s): "HGBA1C" in the last 72 hours. CBG: Recent Labs  Lab 01/22/22 0718 01/22/22 1105 01/22/22 1608 01/22/22 2018 01/23/22 0737  GLUCAP 187* 152* 269* 144* 194*   Lipid Profile: No results for input(s): "CHOL", "HDL", "LDLCALC", "TRIG", "CHOLHDL", "LDLDIRECT" in the last 72 hours. Thyroid Function Tests: No results for input(s): "TSH",  "T4TOTAL", "FREET4", "T3FREE", "THYROIDAB" in the last 72 hours. Anemia Panel: No results for input(s): "VITAMINB12", "FOLATE", "FERRITIN", "TIBC", "IRON", "RETICCTPCT" in the last 72 hours. Sepsis Labs: No results for input(s): "PROCALCITON", "LATICACIDVEN" in the last 168 hours.  No results found for this or any previous visit (from the past 240 hour(s)).       Radiology Studies: No results found.      Scheduled Meds:  (feeding supplement) PROSource Plus  30 mL Oral BID BM   acidophilus  1 capsule Oral Daily   alosetron  1 mg Oral BID   amitriptyline  50 mg Oral QHS   atorvastatin  40 mg Oral q1800   carvedilol  3.125 mg Oral BID   Chlorhexidine Gluconate Cloth  6 each Topical Q0600   clotrimazole   Topical BID   darbepoetin (ARANESP) injection - DIALYSIS  100 mcg Subcutaneous Q Fri-1800   docusate sodium  200 mg Oral BID   DULoxetine  60 mg Oral Daily   famotidine  20 mg Oral Daily   fentaNYL  1 patch Transdermal Q72H   furosemide  80 mg Oral Daily   heparin sodium (porcine)  2,000 Units Intravenous Once   hydrALAZINE  50 mg Oral TID   insulin aspart  0-9 Units Subcutaneous TID WC   insulin aspart  6 Units Subcutaneous TID WC   insulin glargine-yfgn  10 Units Subcutaneous QHS   methocarbamol  500 mg Oral QID   multivitamin  1 tablet Oral QHS   pantoprazole  80 mg Oral Daily   polyethylene glycol  17 g Oral Daily   sevelamer carbonate  2,400 mg Oral TID WC   sodium zirconium cyclosilicate  10 g Oral Once per day on Sun Tue Thu Sat   Continuous Infusions:  albumin human       LOS: 150 days    Time spent: 48 minutes spent on chart review, discussion with nursing staff, consultants, updating family and interview/physical exam; more than 50% of that time was spent in counseling and/or coordination of care.    Jhoan Schmieder J [place] DO [facility] Available via Epic secure chat 7am-7pm After these hours, please refer to coverage provider listed on  [email or web address] 01/23/2022, 11:12 AM

## 2022-01-24 DIAGNOSIS — M86252 Subacute osteomyelitis, left femur: Secondary | ICD-10-CM | POA: Diagnosis not present

## 2022-01-24 LAB — CBC
HCT: 31.3 % — ABNORMAL LOW (ref 36.0–46.0)
Hemoglobin: 10.4 g/dL — ABNORMAL LOW (ref 12.0–15.0)
MCH: 29.6 pg (ref 26.0–34.0)
MCHC: 33.2 g/dL (ref 30.0–36.0)
MCV: 89.2 fL (ref 80.0–100.0)
Platelets: 235 10*3/uL (ref 150–400)
RBC: 3.51 MIL/uL — ABNORMAL LOW (ref 3.87–5.11)
RDW: 16.3 % — ABNORMAL HIGH (ref 11.5–15.5)
WBC: 7.6 10*3/uL (ref 4.0–10.5)
nRBC: 0 % (ref 0.0–0.2)

## 2022-01-24 LAB — RENAL FUNCTION PANEL
Albumin: 3.3 g/dL — ABNORMAL LOW (ref 3.5–5.0)
Anion gap: 16 — ABNORMAL HIGH (ref 5–15)
BUN: 80 mg/dL — ABNORMAL HIGH (ref 6–20)
CO2: 25 mmol/L (ref 22–32)
Calcium: 9.4 mg/dL (ref 8.9–10.3)
Chloride: 86 mmol/L — ABNORMAL LOW (ref 98–111)
Creatinine, Ser: 6.58 mg/dL — ABNORMAL HIGH (ref 0.44–1.00)
GFR, Estimated: 7 mL/min — ABNORMAL LOW (ref >60)
Glucose, Bld: 172 mg/dL — ABNORMAL HIGH (ref 70–99)
Phosphorus: 5.7 mg/dL — ABNORMAL HIGH (ref 2.5–4.6)
Potassium: 5.1 mmol/L (ref 3.5–5.1)
Sodium: 127 mmol/L — ABNORMAL LOW (ref 135–145)

## 2022-01-24 LAB — GLUCOSE, CAPILLARY
Glucose-Capillary: 141 mg/dL — ABNORMAL HIGH (ref 70–99)
Glucose-Capillary: 166 mg/dL — ABNORMAL HIGH (ref 70–99)
Glucose-Capillary: 296 mg/dL — ABNORMAL HIGH (ref 70–99)
Glucose-Capillary: 189 mg/dL — ABNORMAL HIGH (ref 70–99)

## 2022-01-24 LAB — HEPATITIS B SURFACE ANTIGEN: Hepatitis B Surface Ag: NONREACTIVE

## 2022-01-24 MED ORDER — HEPARIN SODIUM (PORCINE) 1000 UNIT/ML DIALYSIS: 2000.0000 [IU] | INTRAMUSCULAR | Status: DC | PRN

## 2022-01-24 MED ORDER — HEPARIN SODIUM (PORCINE) 1000 UNIT/ML IJ SOLN
INTRAMUSCULAR | Status: AC
  Filled 2022-01-24: qty 4

## 2022-01-24 MED ORDER — ALTEPLASE 2 MG IJ SOLR: 2.0000 mg | Freq: Once | INTRAMUSCULAR | Status: DC | PRN

## 2022-01-24 MED ORDER — HEPARIN SODIUM (PORCINE) 1000 UNIT/ML DIALYSIS
4000.0000 [IU] | Freq: Once | INTRAMUSCULAR | Status: AC
  Administered 2022-01-24: 4000 [IU] via INTRAVENOUS_CENTRAL

## 2022-01-24 MED ORDER — ANTICOAGULANT SODIUM CITRATE 4% (200MG/5ML) IV SOLN: 5.0000 mL | Status: DC | PRN

## 2022-01-24 NOTE — Progress Notes (Signed)
TX initiated via LUA AVF.Cannulated with needle G15x2 without difficulty. No signs and symptoms of infection. Connected A-A V-V. BFR achieved. UF Goal of 4.5L. Lined secured. Access visible.

## 2022-01-24 NOTE — Progress Notes (Signed)
PROGRESS NOTE    [name]  QMV:[id number] DOB: [birth year] DOA: 08/26/2021 PCP: [name], MD    Brief Narrative:   [name] is a 56 y.o. female with past medical history significant for hypertension, hyperlipidemia, diabetes mellitus type 2, end-stage renal disease on hemodialysis, bipolar disorder, status post left BKA for chronic calcaneus osteomyelitis on 01/12/2020, left distal femoral fracture after a fall on 07/19/2021 deemed nonoperative, admission from 08/03/2021-08/23/2021 for left femoral osteomyelitis requiring partial resection of femur and rearrangements of bony fragments with subsequent antibiotic treatment presented to the hospital with left hip bleeding and was found to have subacute osteomyelitis and abscess of left femur.  Orthopedic/ID were consulted and patient was started on broad-spectrum antibiotics.  Underwent multiple I&D's by Dr. [name] along with local tissue rearrangement.  She was also seen by vascular surgery who did  fistula branch ligation.  She has completed 6-week course of daptomycin on 10/09/2021.    She has been unable to complete hemodialysis in chair and outpatient hemodialysis unit has been hard to find.  She is otherwise medically stable for discharge.   Assessment & Plan:   Subacute osteomyelitis of left femur with abscess Status post surgical intervention by Dr. [name] with multiple left hip debridements last on 08/31/2021.  Completed a 6-week course of daptomycin on 10/11/2021.  Repeat imaging on 12/02/2021 of left knee showed healing fracture with callus.  Orthopedics Dr [name] has followed and is doing well.  No left hip restrictions at this time.   --Fentanyl patch 12 mcg every 72 hours --Oxycodone 500 mg p.o. every 4 hours as needed moderate pain --Robaxin 5 mg p.o. 4 times daily   Chronic pain.    --Fentanyl patch 12 mcg every 72 hours  --Oxycodone 5 mg PO q4h PRN moderate pain --Robaxin 500 mg p.o. 4 times daily --Cymbalta 60 mg p.o.  daily --Amitriptyline 50 mg p.o. nightly  Anterior chest wall pain.   Abdominal x-ray was negative. Appears to be chest wall related.  Continue warm compress.  Continue muscle relaxant and oxycodone.    Right thigh mild cellulitis: resolved Improved, received a course of antibiotic in the past.  Likely from anasarca.     History of left intertrochanteric fracture Left hip hardware removed on 08/10/2021 prior to admission.  Physical therapy working to scoot and mobilize with wheelchair.  Will need at least 4 hours to be able to be considered for outpatient hemodialysis.  Currently has not been able to do much including lack of motivation.   End-stage renal disease on hemodialysis Anasarca --Nephrology following; appreciate assistance --Renal diet with 1.2 L fluid restriction --continue dialysis as per nephrology.    --Strict intake and output, daily weights --intermittent Lokelma for hyperkalemia, last Potassium  4.8 on 11/19 --Awaiting outpatient hemodialysis chair.   Hyponatremia Latest sodium of 127.  Managed with hemodialysis.   Hyperphosphatemia --continue sevelamer.   Anemia of chronic disease Latest hemoglobin  at 10.4.  Continue to monitor.   Diabetes mellitus type 2 with hyperglycemia Hemoglobin A1c 5.9 on 11/14/2021. --Semglee 10u [place] daily --Novolog 6u TIDAC --Sensitive SSI for coverage --CBGs qAC/HS  Essential hypertension --Coreg 3.125mg  PO BID --Hydralazine 50mg  PO TID --Lasix 80mg  PO daily  History of unspecified CVA Hyperlipidemia Continue Lipitor 40mg  PO daily   GERD --Continue Protonix 80mg  PO daily and Pepcid 20mg  PO daily   Tobacco use Counseled on need for complete cessation   Anxiety and depression --Continue amitriptyline 50mg  PO qHS and Cymbalta 60mg  PO daily.  Fungal rash left stump Patient reports was wearing her stump sock for several days without changing now with slightly irritated pruritic rash to left stump.  No concerning areas of  fluctuance/erythema. --Clotrimazole topically twice daily x 7 days   DVT prophylaxis: Place TED hose Start: 09/06/21 1507 SCDs Start: 08/31/21 1058    Code Status: Full Code Family Communication: No family present at bedside this morning  Disposition Plan:  Level of care: Med-Surg Status is: Inpatient Remains inpatient appropriate because: Needs HD seat set up before stable for discharge    Consultants:  Nephrology Vascular surgery Orthopedics Infectious disease  Procedures:  I&D's with tissue arrangement. Fistula branch ligation.   Antimicrobials:  Ceftriaxone 10/15 - 10/18 Doxycycline 10/14 - 10/14 Cefazolin 7/1 - 7/1, 7/20 - 7/20 Daptomycin 6/29 - 8/11   Subjective: Patient seen examined bedside, resting comfortably.  Currently on hemodialysis. No specific complaints this morning.  Denies headache, no dizziness, no chest pain, no palpitations, no shortness of breath, no abdominal pain, no fever/chills/night sweats, no nausea/vomiting/diarrhea, no focal weakness, no fatigue, no paresthesias.  No acute events overnight per nursing staff.  Objective: Vitals:   01/24/22 0930 01/24/22 0934 01/24/22 1000 01/24/22 1030  BP:  (!) 172/59 (!) 169/90 (!) 154/56  Pulse: 60 (!) 59 62 65  Resp: 14 12 11 12   Temp:      TempSrc:      SpO2: 100% 100% 100% 99%  Weight:      Height:        Intake/Output Summary (Last 24 hours) at 01/24/2022 1040 Last data filed at 01/23/2022 2120 Gross per 24 hour  Intake 360 ml  Output 0 ml  Net 360 ml   Filed Weights   01/21/22 0829 01/21/22 1310 01/24/22 0902  Weight: 97.8 kg 93.3 kg 97.8 kg    Examination:  Physical Exam: GEN: NAD, alert and oriented x 3, chronically ill appearance, appears older than stated age HEENT: NCAT, PERRL, EOMI, sclera clear, MMM PULM: CTAB w/o wheezes/crackles, normal respiratory effort CV: RRR w/o M/G/R GI: abd soft, NTND, NABS, no R/G/M MSK: Left BKA noted, left upper extremity AV fistula noted,  moves all extremities independently NEURO: CN II-XII intact, no focal deficits, sensation to light touch intact PSYCH: normal mood/affect Integumentary: Lacy rash noted to left thumb, otherwise no other concerning rashes/lesions/wounds noted on exposed skin surfaces.     Data Reviewed: I have personally reviewed following labs and imaging studies  CBC: Recent Labs  Lab 01/17/22 1720 01/19/22 0800 01/24/22 0940  WBC 6.3 6.4 7.6  HGB 10.2* 10.0* 10.4*  HCT 31.0* 31.4* 31.3*  MCV 89.1 91.0 89.2  PLT 221 232 518   Basic Metabolic Panel: Recent Labs  Lab 01/17/22 1720 01/19/22 0800 01/24/22 0324  NA 128* 127* 127*  K 3.7 4.8 5.1  CL 89* 89* 86*  CO2 25 25 25   GLUCOSE 178* 207* 172*  BUN 36* 57* 80*  CREATININE 3.28* 4.63* 6.58*  CALCIUM 8.5* 8.9 9.4  PHOS 2.9 5.9* 5.7*   GFR: Estimated Creatinine Clearance: 12.4 mL/min (A) (by C-G formula based on SCr of 6.58 mg/dL (H)). Liver Function Tests: Recent Labs  Lab 01/17/22 1720 01/19/22 0800 01/24/22 0324  ALBUMIN 3.5 3.4* 3.3*   No results for input(s): "LIPASE", "AMYLASE" in the last 168 hours. No results for input(s): "AMMONIA" in the last 168 hours. Coagulation Profile: No results for input(s): "INR", "PROTIME" in the last 168 hours. Cardiac Enzymes: No results for input(s): "CKTOTAL", "CKMB", "CKMBINDEX", "TROPONINI" in  the last 168 hours. BNP (last 3 results) No results for input(s): "PROBNP" in the last 8760 hours. HbA1C: No results for input(s): "HGBA1C" in the last 72 hours. CBG: Recent Labs  Lab 01/23/22 0737 01/23/22 1126 01/23/22 1609 01/23/22 2120 01/24/22 0739  GLUCAP 194* 198* 132* 142* 141*   Lipid Profile: No results for input(s): "CHOL", "HDL", "LDLCALC", "TRIG", "CHOLHDL", "LDLDIRECT" in the last 72 hours. Thyroid Function Tests: No results for input(s): "TSH", "T4TOTAL", "FREET4", "T3FREE", "THYROIDAB" in the last 72 hours. Anemia Panel: No results for input(s): "VITAMINB12", "FOLATE",  "FERRITIN", "TIBC", "IRON", "RETICCTPCT" in the last 72 hours. Sepsis Labs: No results for input(s): "PROCALCITON", "LATICACIDVEN" in the last 168 hours.  No results found for this or any previous visit (from the past 240 hour(s)).       Radiology Studies: No results found.      Scheduled Meds:  (feeding supplement) PROSource Plus  30 mL Oral BID BM   acidophilus  1 capsule Oral Daily   alosetron  1 mg Oral BID   amitriptyline  50 mg Oral QHS   atorvastatin  40 mg Oral q1800   carvedilol  3.125 mg Oral BID   Chlorhexidine Gluconate Cloth  6 each Topical Q0600   clotrimazole   Topical BID   darbepoetin (ARANESP) injection - DIALYSIS  100 mcg Subcutaneous Q Fri-1800   docusate sodium  200 mg Oral BID   DULoxetine  60 mg Oral Daily   famotidine  20 mg Oral Daily   fentaNYL  1 patch Transdermal Q72H   furosemide  80 mg Oral Daily   heparin sodium (porcine)       heparin sodium (porcine)  2,000 Units Intravenous Once   hydrALAZINE  50 mg Oral TID   insulin aspart  0-9 Units Subcutaneous TID WC   insulin aspart  6 Units Subcutaneous TID WC   insulin glargine-yfgn  10 Units Subcutaneous QHS   methocarbamol  500 mg Oral QID   multivitamin  1 tablet Oral QHS   pantoprazole  80 mg Oral Daily   polyethylene glycol  17 g Oral Daily   sevelamer carbonate  2,400 mg Oral TID WC   sodium zirconium cyclosilicate  10 g Oral Once per day on Sun Tue Thu Sat   Continuous Infusions:  albumin human     anticoagulant sodium citrate       LOS: 151 days    Time spent: 48 minutes spent on chart review, discussion with nursing staff, consultants, updating family and interview/physical exam; more than 50% of that time was spent in counseling and/or coordination of care.    Jacqulyn Barresi J [place] DO [facility] Available via Epic secure chat 7am-7pm After these hours, please refer to coverage provider listed on [email or web address] 01/24/2022, 10:40 AM

## 2022-01-24 NOTE — Procedures (Signed)
I was present at this dialysis session. I have reviewed the session itself and made appropriate changes.   In bed.  No c/o  2K bath.  4.5L UF Goal.  No issues.   Filed Weights   01/21/22 0829 01/21/22 1310 01/24/22 0902  Weight: 97.8 kg 93.3 kg 97.8 kg    Recent Labs  Lab 01/24/22 0324  NA 127*  K 5.1  CL 86*  CO2 25  GLUCOSE 172*  BUN 80*  CREATININE 6.58*  CALCIUM 9.4  PHOS 5.7*    Recent Labs  Lab 01/17/22 1720 01/19/22 0800  WBC 6.3 6.4  HGB 10.2* 10.0*  HCT 31.0* 31.4*  MCV 89.1 91.0  PLT 221 232    Scheduled Meds:  (feeding supplement) PROSource Plus  30 mL Oral BID BM   acidophilus  1 capsule Oral Daily   alosetron  1 mg Oral BID   amitriptyline  50 mg Oral QHS   atorvastatin  40 mg Oral q1800   carvedilol  3.125 mg Oral BID   Chlorhexidine Gluconate Cloth  6 each Topical Q0600   clotrimazole   Topical BID   darbepoetin (ARANESP) injection - DIALYSIS  100 mcg Subcutaneous Q Fri-1800   docusate sodium  200 mg Oral BID   DULoxetine  60 mg Oral Daily   famotidine  20 mg Oral Daily   fentaNYL  1 patch Transdermal Q72H   furosemide  80 mg Oral Daily   heparin  4,000 Units Dialysis Once in dialysis   heparin sodium (porcine)       heparin sodium (porcine)  2,000 Units Intravenous Once   hydrALAZINE  50 mg Oral TID   insulin aspart  0-9 Units Subcutaneous TID WC   insulin aspart  6 Units Subcutaneous TID WC   insulin glargine-yfgn  10 Units Subcutaneous QHS   methocarbamol  500 mg Oral QID   multivitamin  1 tablet Oral QHS   pantoprazole  80 mg Oral Daily   polyethylene glycol  17 g Oral Daily   sevelamer carbonate  2,400 mg Oral TID WC   sodium zirconium cyclosilicate  10 g Oral Once per day on Sun Tue Thu Sat   Continuous Infusions:  albumin human     anticoagulant sodium citrate     PRN Meds:.acetaminophen, albumin human, alteplase, anticoagulant sodium citrate, bisacodyl, calcium carbonate, camphor-menthol, heparin, heparin sodium (porcine),  hydrALAZINE, hydrOXYzine, ipratropium-albuterol, metoprolol tartrate, ondansetron, mouth rinse, oxyCODONE, prochlorperazine, prochlorperazine, senna-docusate   Pearson Grippe  MD 01/24/2022, 9:35 AM

## 2022-01-24 NOTE — Progress Notes (Signed)
Received patient in bed to unit.  Alert and oriented.  Informed consent signed and in chart.    Patient tolerated well.  Transported back to the room  Alert, without acute distress.  Hand-off given to patient's nurse.   Access used: LUA AVF Access issues: none  Total UF removed: 4.5L Medication(s) given: none    [facility]

## 2022-01-24 NOTE — Plan of Care (Signed)
  Problem: Activity: Goal: Risk for activity intolerance will decrease Outcome: Progressing   

## 2022-01-25 DIAGNOSIS — M86252 Subacute osteomyelitis, left femur: Secondary | ICD-10-CM | POA: Diagnosis not present

## 2022-01-25 LAB — GLUCOSE, CAPILLARY
Glucose-Capillary: 109 mg/dL — ABNORMAL HIGH (ref 70–99)
Glucose-Capillary: 233 mg/dL — ABNORMAL HIGH (ref 70–99)
Glucose-Capillary: 152 mg/dL — ABNORMAL HIGH (ref 70–99)
Glucose-Capillary: 143 mg/dL — ABNORMAL HIGH (ref 70–99)

## 2022-01-25 NOTE — Progress Notes (Signed)
PROGRESS NOTE    [name]  WJX:[id number] DOB: 06/15/1965 DOA: 08/26/2021 PCP: Donna Mariscal, MD    Brief Narrative:   [name] is a 56 y.o. female with past medical history significant for hypertension, hyperlipidemia, diabetes mellitus type 2, end-stage renal disease on hemodialysis, bipolar disorder, status post left BKA for chronic calcaneus osteomyelitis on 01/12/2020, left distal femoral fracture after a fall on 07/19/2021 deemed nonoperative, admission from 08/03/2021-08/23/2021 for left femoral osteomyelitis requiring partial resection of femur and rearrangements of bony fragments with subsequent antibiotic treatment presented to the hospital with left hip bleeding and was found to have subacute osteomyelitis and abscess of left femur.  Orthopedic/ID were consulted and patient was started on broad-spectrum antibiotics.  Underwent multiple I&D's by Dr. [name] along with local tissue rearrangement.  She was also seen by vascular surgery who did  fistula branch ligation.  She has completed 6-week course of daptomycin on 10/09/2021.    She has been unable to complete hemodialysis in chair and outpatient hemodialysis unit has been hard to find.  She is otherwise medically stable for discharge.   Assessment & Plan:   Subacute osteomyelitis of left femur with abscess Status post surgical intervention by Dr. [name] with multiple left hip debridements last on 08/31/2021.  Completed a 6-week course of daptomycin on 10/11/2021.  Repeat imaging on 12/02/2021 of left knee showed healing fracture with callus.  Orthopedics Dr [name] has followed and is doing well.  No left hip restrictions at this time.   --Fentanyl patch 12 mcg every 72 hours --Oxycodone 500 mg p.o. every 4 hours as needed moderate pain --Robaxin 5 mg p.o. 4 times daily   Chronic pain.    --Fentanyl patch 12 mcg every 72 hours  --Oxycodone 5 mg PO q4h PRN moderate pain --Robaxin 500 mg p.o. 4 times daily --Cymbalta 60 mg p.o.  daily --Amitriptyline 50 mg p.o. nightly  Anterior chest wall pain.   Abdominal x-ray was negative. Appears to be chest wall related.  Continue warm compress.  Continue muscle relaxant and oxycodone.    Right thigh mild cellulitis: resolved Improved, received a course of antibiotic in the past.  Likely from anasarca.     History of left intertrochanteric fracture Left hip hardware removed on 08/10/2021 prior to admission.  Physical therapy working to scoot and mobilize with wheelchair.  Will need at least 4 hours to be able to be considered for outpatient hemodialysis.  Currently has not been able to do much including lack of motivation.   End-stage renal disease on hemodialysis Anasarca --Nephrology following; appreciate assistance --Renal diet with 1.2 L fluid restriction --continue dialysis as per nephrology.    --Strict intake and output, daily weights --intermittent Lokelma for hyperkalemia, last Potassium  5.1 on 11/24 --Awaiting outpatient hemodialysis chair.   Hyponatremia Latest sodium of 127.  Managed with hemodialysis.   Hyperphosphatemia --continue sevelamer.   Anemia of chronic disease Latest hemoglobin  at 10.4.  Continue to monitor.   Diabetes mellitus type 2 with hyperglycemia Hemoglobin A1c 5.9 on 11/14/2021. --Semglee 10u Gully daily --Novolog 6u TIDAC --Sensitive SSI for coverage --CBGs qAC/HS  Essential hypertension --Coreg 3.125mg  PO BID --Hydralazine 50mg  PO TID --Lasix 80mg  PO daily  History of unspecified CVA Hyperlipidemia Continue Lipitor 40mg  PO daily   GERD --Continue Protonix 80mg  PO daily and Pepcid 20mg  PO daily   Tobacco use Counseled on need for complete cessation   Anxiety and depression --Continue amitriptyline 50mg  PO qHS and Cymbalta 60mg  PO daily.  Fungal rash left stump Patient reports was wearing her stump sock for several days without changing now with slightly irritated pruritic rash to left stump.  No concerning areas of  fluctuance/erythema. --Clotrimazole topically twice daily x 7 days   DVT prophylaxis: Place TED hose Start: 09/06/21 1507 SCDs Start: 08/31/21 1058    Code Status: Full Code Family Communication: No family present at bedside this morning  Disposition Plan:  Level of care: Med-Surg Status is: Inpatient Remains inpatient appropriate because: Needs HD seat set up before stable for discharge    Consultants:  Nephrology Vascular surgery Orthopedics Infectious disease  Procedures:  I&D's with tissue arrangement. Fistula branch ligation.   Antimicrobials:  Ceftriaxone 10/15 - 10/18 Doxycycline 10/14 - 10/14 Cefazolin 7/1 - 7/1, 7/20 - 7/20 Daptomycin 6/29 - 8/11   Subjective: Patient seen examined bedside, resting comfortably.  Lying in bed.  No issues with hemodialysis yesterday.  No specific complaints this morning.  Denies headache, no dizziness, no chest pain, no palpitations, no shortness of breath, no abdominal pain, no fever/chills/night sweats, no nausea/vomiting/diarrhea, no focal weakness, no fatigue, no paresthesias.  No acute events overnight per nursing staff.  Objective: Vitals:   01/24/22 1500 01/24/22 1656 01/24/22 2157 01/25/22 0931  BP: (!) 152/52 (!) 165/70  (!) 156/52  Pulse: 70 75 77 68  Resp:  18 18 18   Temp: 98.1 F (36.7 C) 97.7 F (36.5 C) 98 F (36.7 C) 98.4 F (36.9 C)  TempSrc: Oral Oral Oral Oral  SpO2: 100% 100% 100% 99%  Weight:      Height:        Intake/Output Summary (Last 24 hours) at 01/25/2022 1046 Last data filed at 01/25/2022 0200 Gross per 24 hour  Intake 600 ml  Output 4500 ml  Net -3900 ml   Filed Weights   01/21/22 1310 01/24/22 0902 01/24/22 1344  Weight: 93.3 kg 97.8 kg 93.4 kg    Examination:  Physical Exam: GEN: NAD, alert and oriented x 3, chronically ill appearance, appears older than stated age HEENT: NCAT, PERRL, EOMI, sclera clear, MMM PULM: CTAB w/o wheezes/crackles, normal respiratory effort CV: RRR  w/o M/G/R GI: abd soft, NTND, NABS, no R/G/M MSK: Left BKA noted, left upper extremity AV fistula noted, moves all extremities independently NEURO: CN II-XII intact, no focal deficits, sensation to light touch intact PSYCH: normal mood/affect Integumentary: Lacy rash noted to left thumb, otherwise no other concerning rashes/lesions/wounds noted on exposed skin surfaces.     Data Reviewed: I have personally reviewed following labs and imaging studies  CBC: Recent Labs  Lab 01/19/22 0800 01/24/22 0940  WBC 6.4 7.6  HGB 10.0* 10.4*  HCT 31.4* 31.3*  MCV 91.0 89.2  PLT 232 270   Basic Metabolic Panel: Recent Labs  Lab 01/19/22 0800 01/24/22 0324  NA 127* 127*  K 4.8 5.1  CL 89* 86*  CO2 25 25  GLUCOSE 207* 172*  BUN 57* 80*  CREATININE 4.63* 6.58*  CALCIUM 8.9 9.4  PHOS 5.9* 5.7*   GFR: Estimated Creatinine Clearance: 12.1 mL/min (A) (by C-G formula based on SCr of 6.58 mg/dL (H)). Liver Function Tests: Recent Labs  Lab 01/19/22 0800 01/24/22 0324  ALBUMIN 3.4* 3.3*   No results for input(s): "LIPASE", "AMYLASE" in the last 168 hours. No results for input(s): "AMMONIA" in the last 168 hours. Coagulation Profile: No results for input(s): "INR", "PROTIME" in the last 168 hours. Cardiac Enzymes: No results for input(s): "CKTOTAL", "CKMB", "CKMBINDEX", "TROPONINI" in the last 168  hours. BNP (last 3 results) No results for input(s): "PROBNP" in the last 8760 hours. HbA1C: No results for input(s): "HGBA1C" in the last 72 hours. CBG: Recent Labs  Lab 01/24/22 0739 01/24/22 1458 01/24/22 1654 01/24/22 2106 01/25/22 0727  GLUCAP 141* 166* 296* 189* 109*   Lipid Profile: No results for input(s): "CHOL", "HDL", "LDLCALC", "TRIG", "CHOLHDL", "LDLDIRECT" in the last 72 hours. Thyroid Function Tests: No results for input(s): "TSH", "T4TOTAL", "FREET4", "T3FREE", "THYROIDAB" in the last 72 hours. Anemia Panel: No results for input(s): "VITAMINB12", "FOLATE",  "FERRITIN", "TIBC", "IRON", "RETICCTPCT" in the last 72 hours. Sepsis Labs: No results for input(s): "PROCALCITON", "LATICACIDVEN" in the last 168 hours.  No results found for this or any previous visit (from the past 240 hour(s)).       Radiology Studies: No results found.      Scheduled Meds:  (feeding supplement) PROSource Plus  30 mL Oral BID BM   acidophilus  1 capsule Oral Daily   alosetron  1 mg Oral BID   amitriptyline  50 mg Oral QHS   atorvastatin  40 mg Oral q1800   carvedilol  3.125 mg Oral BID   Chlorhexidine Gluconate Cloth  6 each Topical Q0600   clotrimazole   Topical BID   darbepoetin (ARANESP) injection - DIALYSIS  100 mcg Subcutaneous Q Fri-1800   docusate sodium  200 mg Oral BID   DULoxetine  60 mg Oral Daily   famotidine  20 mg Oral Daily   fentaNYL  1 patch Transdermal Q72H   furosemide  80 mg Oral Daily   heparin sodium (porcine)  2,000 Units Intravenous Once   hydrALAZINE  50 mg Oral TID   insulin aspart  0-9 Units Subcutaneous TID WC   insulin aspart  6 Units Subcutaneous TID WC   insulin glargine-yfgn  10 Units Subcutaneous QHS   methocarbamol  500 mg Oral QID   multivitamin  1 tablet Oral QHS   pantoprazole  80 mg Oral Daily   polyethylene glycol  17 g Oral Daily   sevelamer carbonate  2,400 mg Oral TID WC   sodium zirconium cyclosilicate  10 g Oral Once per day on Sun Tue Thu Sat   Continuous Infusions:  albumin human       LOS: 152 days    Time spent: 48 minutes spent on chart review, discussion with nursing staff, consultants, updating family and interview/physical exam; more than 50% of that time was spent in counseling and/or coordination of care.    Amine Adelson J [place] DO [facility] Available via Epic secure chat 7am-7pm After these hours, please refer to coverage provider listed on [email or web address] 01/25/2022, 10:46 AM

## 2022-01-25 NOTE — Progress Notes (Signed)
[facility] Progress Note   Subjective:   No concerns today, 4.4kg off with HD yesterday. Still some edema in lower extremities. Denies SOB, CP and nausea. .   Objective Vitals:   01/24/22 1344 01/24/22 1500 01/24/22 1656 01/24/22 2157  BP: (!) 149/61 (!) 152/52 (!) 165/70   Pulse: 63 70 75 77  Resp: 13  18 18   Temp: 98.3 F (36.8 C) 98.1 F (36.7 C) 97.7 F (36.5 C) 98 F (36.7 C)  TempSrc: Oral Oral Oral Oral  SpO2: 99% 100% 100% 100%  Weight: 93.4 kg     Height:       Physical Exam General: [name] Heart: RRR, no murmurs, rubs or gallops Lungs: CTA bilaterally without wheezing, rhonchi or rales Abdomen: Soft, non-distended, +BS Extremities: L BKA with 1+ edema, RLE trace edema Dialysis Access: LUE AVF + bruit  Additional Objective Labs: Basic Metabolic Panel: Recent Labs  Lab 01/19/22 0800 01/24/22 0324  NA 127* 127*  K 4.8 5.1  CL 89* 86*  CO2 25 25  GLUCOSE 207* 172*  BUN 57* 80*  CREATININE 4.63* 6.58*  CALCIUM 8.9 9.4  PHOS 5.9* 5.7*   Liver Function Tests: Recent Labs  Lab 01/19/22 0800 01/24/22 0324  ALBUMIN 3.4* 3.3*   No results for input(s): "LIPASE", "AMYLASE" in the last 168 hours. CBC: Recent Labs  Lab 01/19/22 0800 01/24/22 0940  WBC 6.4 7.6  HGB 10.0* 10.4*  HCT 31.4* 31.3*  MCV 91.0 89.2  PLT 232 235   Blood Culture    Component Value Date/Time   SDES TISSUE 08/28/2021 0917   SPECREQUEST LEFT THIGH TISSUE 08/28/2021 0917   CULT  08/28/2021 0917    MODERATE ENTEROCOCCUS FAECIUM VANCOMYCIN RESISTANT ENTEROCOCCUS ISOLATED NO ANAEROBES ISOLATED Sent to [place] for further susceptibility testing. Performed at [facility], [place] [address] [place], [place] [address]    REPTSTATUS 09/02/2021 FINAL 08/28/2021 7106    Cardiac Enzymes: No results for input(s): "CKTOTAL", "CKMB", "CKMBINDEX", "TROPONINI" in the last 168 hours. CBG: Recent Labs  Lab 01/24/22 0739 01/24/22 1458 01/24/22 1654  01/24/22 2106 01/25/22 0727  GLUCAP 141* 166* 296* 189* 109*   Iron Studies: No results for input(s): "IRON", "TIBC", "TRANSFERRIN", "FERRITIN" in the last 72 hours. @lablastinr3 @ Studies/Results: No results found. Medications:  albumin human      (feeding supplement) PROSource Plus  30 mL Oral BID BM   acidophilus  1 capsule Oral Daily   alosetron  1 mg Oral BID   amitriptyline  50 mg Oral QHS   atorvastatin  40 mg Oral q1800   carvedilol  3.125 mg Oral BID   Chlorhexidine Gluconate Cloth  6 each Topical Q0600   clotrimazole   Topical BID   darbepoetin (ARANESP) injection - DIALYSIS  100 mcg Subcutaneous Q Fri-1800   docusate sodium  200 mg Oral BID   DULoxetine  60 mg Oral Daily   famotidine  20 mg Oral Daily   fentaNYL  1 patch Transdermal Q72H   furosemide  80 mg Oral Daily   heparin sodium (porcine)  2,000 Units Intravenous Once   hydrALAZINE  50 mg Oral TID   insulin aspart  0-9 Units Subcutaneous TID WC   insulin aspart  6 Units Subcutaneous TID WC   insulin glargine-yfgn  10 Units Subcutaneous QHS   methocarbamol  500 mg Oral QID   multivitamin  1 tablet Oral QHS   pantoprazole  80 mg Oral Daily   polyethylene glycol  17 g Oral Daily   sevelamer carbonate  2,400 mg Oral TID WC   sodium zirconium cyclosilicate  10 g Oral Once per day on Sun Tue Thu Sat    Dialysis Orders: Expand All Collapse All    [facility] Progress Note    Subjective:   No new concerns today. Discussed goal of being home by Christmas, encouraged to work with PT. Denies SOB, CP, dizziness and nausea.    Objective       Vitals:    01/22/22 0810 01/22/22 1608 01/22/22 2136 01/23/22 0603  BP: (!) 155/60 (!) 157/68 (!) 154/51 (!) 165/55  Pulse: 64 67 63 (!) 58  Resp: 17 19 18 16   Temp: 98.4 F (36.9 C) 98.7 F (37.1 C) 98 F (36.7 C) 98.2 F (36.8 C)  TempSrc: Oral Oral Oral Oral  SpO2: 99% 99% 100% 100%  Weight:          Height:            Physical Exam General:  [name] Heart: RRR, no murmurs, rubs or gallops Lungs: CTA bilaterally without wheezing, rhonchi or rales Abdomen: Soft, non-distended, +BS Extremities: L BKA with 1+ edema, RLE trace edema Dialysis Access: LUE AVF + bruit   Additional Objective Labs: Basic Metabolic Panel: Last Labs      Recent Labs  Lab 01/17/22 1720 01/19/22 0800  NA 128* 127*  K 3.7 4.8  CL 89* 89*  CO2 25 25  GLUCOSE 178* 207*  BUN 36* 57*  CREATININE 3.28* 4.63*  CALCIUM 8.5* 8.9  PHOS 2.9 5.9*      Liver Function Tests: Last Labs      Recent Labs  Lab 01/17/22 1720 01/19/22 0800  ALBUMIN 3.5 3.4*      Last Labs  No results for input(s): "LIPASE", "AMYLASE" in the last 168 hours.   CBC: Last Labs      Recent Labs  Lab 01/17/22 1720 01/19/22 0800  WBC 6.3 6.4  HGB 10.2* 10.0*  HCT 31.0* 31.4*  MCV 89.1 91.0  PLT 221 232      Blood Culture Labs (Brief)           Component Value Date/Time    SDES TISSUE 08/28/2021 0917    SPECREQUEST LEFT THIGH TISSUE 08/28/2021 0917    CULT   08/28/2021 0917      MODERATE ENTEROCOCCUS FAECIUM VANCOMYCIN RESISTANT ENTEROCOCCUS ISOLATED NO ANAEROBES ISOLATED Sent to [place] for further susceptibility testing. Performed at [facility], [place] [address], [place], [place] [address]      REPTSTATUS 09/02/2021 FINAL 08/28/2021 3903        Cardiac Enzymes: Last Labs  No results for input(s): "CKTOTAL", "CKMB", "CKMBINDEX", "TROPONINI" in the last 168 hours.   CBG: Last Labs         Recent Labs  Lab 01/22/22 0718 01/22/22 1105 01/22/22 1608 01/22/22 2018 01/23/22 0737  GLUCAP 187* 152* 269* 144* 194*      Iron Studies:  Recent Labs (last 2 labs)  No results for input(s): "IRON", "TIBC", "TRANSFERRIN", "FERRITIN" in the last 72 hours.   @lablastinr3 @ Studies/Results: Imaging Results (Last 48 hours)  No results found.   Medications:  albumin human       (feeding supplement) PROSource Plus  30 mL Oral BID BM    acidophilus  1 capsule Oral Daily   alosetron  1 mg Oral BID   amitriptyline  50 mg Oral QHS   atorvastatin  40 mg Oral q1800   carvedilol  3.125 mg Oral BID   darbepoetin (ARANESP) injection - DIALYSIS  100 mcg Subcutaneous Q Fri-1800   docusate sodium  200 mg Oral BID   DULoxetine  60 mg Oral Daily   famotidine  20 mg Oral Daily   fentaNYL  1 patch Transdermal Q72H   furosemide  80 mg Oral Daily   heparin sodium (porcine)  2,000 Units Intravenous Once   hydrALAZINE  50 mg Oral TID   insulin aspart  0-9 Units Subcutaneous TID WC   insulin aspart  6 Units Subcutaneous TID WC   insulin glargine-yfgn  10 Units Subcutaneous QHS   methocarbamol  500 mg Oral QID   multivitamin  1 tablet Oral QHS   pantoprazole  80 mg Oral Daily   polyethylene glycol  17 g Oral Daily   sevelamer carbonate  2,400 mg Oral TID WC   sodium zirconium cyclosilicate  10 g Oral Once per day on Sun Tue Thu Sat      Dialysis Orders: MWF at [place], 400/500, EDW 90kg, 2K/2Ca, LUE AVF - heparin 4000 unit IV bolus, no ESA    Assessment/Plan: 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 Completed 6 wk course of IV Daptomycin on 10/12/21. Per Dr. Jess Barters note 12/28/2021 "NO RESTRICTIONS L HIP".  Chronic L hip/residual limb pain: She does have fractures of L femoral neck (where hardware was removed) as well as distal femur Fx. Pain control per primary.  ESRD: Usual MWF schedule. Clots HD system without heparin, 4000 unit bolus and 2000 unit mid-run bolus.  Hyperkalemia: Stable on scheduled lokelma 4 days a week.  HTN/volume: BP meds have been tapered down - currently on Coreg 3.125mg  BID only. Large fluids gains between HD which has been discussed extensively, edema again - going for max UF 4.5L q HD. Anemia of ESRD: Hgb 9-10s stable. Last tsat 54%. Continue Aranesp 18mcg weekly.  Secondary HPTH: CorrCa slightly high, not on VDRA. Phos higher today, continue Renvela  as binder. Nutrition: Alb low, continue protein supplements.  Renal diet w/fluid restrictions.   AVF dysfunction (resolved): S/p branch ligation 09/19/2021  Dr. Virl Cagey. AVF working well now. DM2: Insulin per primary Dispo: SNF previously recommended, but she declined. Not home HD candidate.  Dialyzed in recliner on 9/4 but signed off early due to pain, has declined to come in a recliner since then.  Strongly discussed on the importance of increasing her mobility as much as possible while she is in her room in addition to following her ongoing fluid restriction. Also reinforced the importance of being in the chair for HD treatments to improve strength and mobility. Does not like to do PT after HD, will try to do HD in the afternoons so she can do PT in the AM. Per Renal Navigator notes there are no clinics that will accept patient unless she will have HD in chair. Will continue to encourage use of chair for Shuman Stanley, PA-C 01/25/2022, 8:46 AM  [facility] Pager: [phone]

## 2022-01-25 NOTE — Plan of Care (Signed)
  Problem: Activity: Goal: Risk for activity intolerance will decrease Outcome: Progressing   Problem: Pain Managment: Goal: General experience of comfort will improve Outcome: Progressing   

## 2022-01-26 DIAGNOSIS — M86252 Subacute osteomyelitis, left femur: Secondary | ICD-10-CM | POA: Diagnosis not present

## 2022-01-26 LAB — GLUCOSE, CAPILLARY
Glucose-Capillary: 150 mg/dL — ABNORMAL HIGH (ref 70–99)
Glucose-Capillary: 209 mg/dL — ABNORMAL HIGH (ref 70–99)
Glucose-Capillary: 147 mg/dL — ABNORMAL HIGH (ref 70–99)
Glucose-Capillary: 163 mg/dL — ABNORMAL HIGH (ref 70–99)

## 2022-01-26 MED ORDER — CHLORHEXIDINE GLUCONATE CLOTH 2 % EX PADS
6.0000 | MEDICATED_PAD | Freq: Every day | CUTANEOUS | Status: DC
  Administered 2022-01-26 – 2022-01-28 (×2): 6 via TOPICAL

## 2022-01-26 NOTE — Progress Notes (Signed)
[facility] Progress Note   Subjective:   No new concerns today, denies SOB, CP, dizziness, abdominal pain and nausea.   Objective Vitals:   01/25/22 1621 01/25/22 1940 01/26/22 0506 01/26/22 0902  BP: (!) 153/57 (!) 160/56 (!) 153/56 (!) 181/55  Pulse: 70 66 (!) 59 (!) 59  Resp: 18 18 18 19   Temp: 98.3 F (36.8 C) 98 F (36.7 C) 97.9 F (36.6 C) 98.2 F (36.8 C)  TempSrc: Oral Oral Oral Oral  SpO2: 99% 98% 100% 98%  Weight:      Height:       Physical Exam General: [name] Heart: RRR, no murmurs, rubs or gallops Lungs: CTA bilaterally without wheezing, rhonchi or rales Abdomen: Soft, non-distended, +BS Extremities: L BKA with 1+ edema, RLE trace edema Dialysis Access: LUE AVF + bruit  Additional Objective Labs: Basic Metabolic Panel: Recent Labs  Lab 01/24/22 0324  NA 127*  K 5.1  CL 86*  CO2 25  GLUCOSE 172*  BUN 80*  CREATININE 6.58*  CALCIUM 9.4  PHOS 5.7*   Liver Function Tests: Recent Labs  Lab 01/24/22 0324  ALBUMIN 3.3*   No results for input(s): "LIPASE", "AMYLASE" in the last 168 hours. CBC: Recent Labs  Lab 01/24/22 0940  WBC 7.6  HGB 10.4*  HCT 31.3*  MCV 89.2  PLT 235   Blood Culture    Component Value Date/Time   SDES TISSUE 08/28/2021 0917   SPECREQUEST LEFT THIGH TISSUE 08/28/2021 0917   CULT  08/28/2021 0917    MODERATE ENTEROCOCCUS FAECIUM VANCOMYCIN RESISTANT ENTEROCOCCUS ISOLATED NO ANAEROBES ISOLATED Sent to [place] for further susceptibility testing. Performed at [facility], [place] [address] [place], [place] [address]    REPTSTATUS 09/02/2021 FINAL 08/28/2021 9741    Cardiac Enzymes: No results for input(s): "CKTOTAL", "CKMB", "CKMBINDEX", "TROPONINI" in the last 168 hours. CBG: Recent Labs  Lab 01/25/22 0727 01/25/22 1129 01/25/22 1628 01/25/22 2123 01/26/22 0710  GLUCAP 109* 233* 152* 143* 150*   Iron Studies: No results for input(s): "IRON", "TIBC", "TRANSFERRIN",  "FERRITIN" in the last 72 hours. @lablastinr3 @ Studies/Results: No results found. Medications:  albumin human      (feeding supplement) PROSource Plus  30 mL Oral BID BM   acidophilus  1 capsule Oral Daily   alosetron  1 mg Oral BID   amitriptyline  50 mg Oral QHS   atorvastatin  40 mg Oral q1800   carvedilol  3.125 mg Oral BID   Chlorhexidine Gluconate Cloth  6 each Topical Q0600   clotrimazole   Topical BID   darbepoetin (ARANESP) injection - DIALYSIS  100 mcg Subcutaneous Q Fri-1800   docusate sodium  200 mg Oral BID   DULoxetine  60 mg Oral Daily   famotidine  20 mg Oral Daily   fentaNYL  1 patch Transdermal Q72H   furosemide  80 mg Oral Daily   heparin sodium (porcine)  2,000 Units Intravenous Once   hydrALAZINE  50 mg Oral TID   insulin aspart  0-9 Units Subcutaneous TID WC   insulin aspart  6 Units Subcutaneous TID WC   insulin glargine-yfgn  10 Units Subcutaneous QHS   methocarbamol  500 mg Oral QID   multivitamin  1 tablet Oral QHS   pantoprazole  80 mg Oral Daily   polyethylene glycol  17 g Oral Daily   sevelamer carbonate  2,400 mg Oral TID WC   sodium zirconium cyclosilicate  10 g Oral Once per day  on Sun Tue Thu Sat    Dialysis Orders:  MWF at [place], 400/500, EDW 90kg, 2K/2Ca, LUE AVF - heparin 4000 unit IV bolus, no ESA  Assessment/Plan: 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 Completed 6 wk course of IV Daptomycin on 10/12/21. Per Dr. Jess Barters note 12/28/2021 "NO RESTRICTIONS L HIP".  Chronic L hip/residual limb pain: She does have fractures of L femoral neck (where hardware was removed) as well as distal femur Fx. Pain control per primary.  ESRD: Usual MWF schedule. Clots HD system without heparin, 4000 unit bolus and 2000 unit mid-run bolus.  Hyperkalemia: Stable on scheduled lokelma 4 days a week.  HTN/volume: BP meds have been tapered down - currently on Coreg 3.125mg  BID only. Large fluids gains  between HD which has been discussed extensively, edema again - going for max UF 4.5L q HD. Anemia of ESRD: Hgb 9-10s stable. Last tsat 54%. Continue Aranesp 165mcg weekly.  Secondary HPTH: CorrCa slightly high, not on VDRA. Phos higher today, continue Renvela as binder. Nutrition: Alb low, continue protein supplements.  Renal diet w/fluid restrictions.   AVF dysfunction (resolved): S/p branch ligation 09/19/2021  Dr. Virl Cagey. AVF working well now. DM2: Insulin per primary Dispo: SNF previously recommended, but she declined. Not home HD candidate.  Dialyzed in recliner on 9/4 but signed off early due to pain, has declined to come in a recliner since then.  Strongly discussed on the importance of increasing her mobility as much as possible while she is in her room in addition to following her ongoing fluid restriction. Also reinforced the importance of being in the chair for HD treatments to improve strength and mobility. Does not like to do PT after HD, will try to do HD in the afternoons so she can do PT in the AM. Per Renal Navigator notes there are no clinics that will accept patient unless she will have HD in chair. Will continue to encourage use of chair for [employer], PA-C 01/26/2022, 10:33 AM  [facility] Pager: [phone]

## 2022-01-26 NOTE — Progress Notes (Signed)
PROGRESS NOTE    [name]  KZS:[id number] DOB: 08-28-1965 DOA: 08/26/2021 PCP: [name], MD    Brief Narrative:   [name] is a 56 y.o. female with past medical history significant for hypertension, hyperlipidemia, diabetes mellitus type 2, end-stage renal disease on hemodialysis, bipolar disorder, status post left BKA for chronic calcaneus osteomyelitis on 01/12/2020, left distal femoral fracture after a fall on 07/19/2021 deemed nonoperative, admission from 08/03/2021-08/23/2021 for left femoral osteomyelitis requiring partial resection of femur and rearrangements of bony fragments with subsequent antibiotic treatment presented to the hospital with left hip bleeding and was found to have subacute osteomyelitis and abscess of left femur.  Orthopedic/ID were consulted and patient was started on broad-spectrum antibiotics.  Underwent multiple I&D's by Dr. [name] along with local tissue rearrangement.  She was also seen by vascular surgery who did  fistula branch ligation.  She has completed 6-week course of daptomycin on 10/09/2021.    She has been unable to complete hemodialysis in chair and outpatient hemodialysis unit has been hard to find.  She is otherwise medically stable for discharge.   Assessment & Plan:   Subacute osteomyelitis of left femur with abscess Status post surgical intervention by Dr. [name] with multiple left hip debridements last on 08/31/2021.  Completed a 6-week course of daptomycin on 10/11/2021.  Repeat imaging on 12/02/2021 of left knee showed healing fracture with callus.  Orthopedics Dr [name] has followed and is doing well.  No left hip restrictions at this time.   --Fentanyl patch 12 mcg every 72 hours --Oxycodone 500 mg p.o. every 4 hours as needed moderate pain --Robaxin 5 mg p.o. 4 times daily   Chronic pain.    --Fentanyl patch 12 mcg every 72 hours  --Oxycodone 5 mg PO q4h PRN moderate pain --Robaxin 500 mg p.o. 4 times daily --Cymbalta 60 mg p.o.  daily --Amitriptyline 50 mg p.o. nightly  Anterior chest wall pain.   Abdominal x-ray was negative. Appears to be chest wall related.  Continue warm compress.  Continue muscle relaxant and oxycodone.    Right thigh mild cellulitis: resolved Improved, received a course of antibiotic in the past.  Likely from anasarca.     History of left intertrochanteric fracture Left hip hardware removed on 08/10/2021 prior to admission.  Physical therapy working to scoot and mobilize with wheelchair.  Will need at least 4 hours to be able to be considered for outpatient hemodialysis.  Currently has not been able to do much including lack of motivation.   End-stage renal disease on hemodialysis Anasarca --Nephrology following; appreciate assistance --Renal diet with 1.2 L fluid restriction --continue dialysis as per nephrology.    --Strict intake and output, daily weights --intermittent Lokelma for hyperkalemia, last Potassium  5.1 on 11/24 --Awaiting outpatient hemodialysis chair.   Hyponatremia Latest sodium of 127.  Managed with hemodialysis.   Hyperphosphatemia --continue sevelamer.   Anemia of chronic disease Latest hemoglobin  at 10.4.  Continue to monitor.   Diabetes mellitus type 2 with hyperglycemia Hemoglobin A1c 5.9 on 11/14/2021. --Semglee 10u [place] daily --Novolog 6u TIDAC --Sensitive SSI for coverage --CBGs qAC/HS  Essential hypertension --Coreg 3.125mg  PO BID --Hydralazine 50mg  PO TID --Lasix 80mg  PO daily  History of unspecified CVA Hyperlipidemia Continue Lipitor 40mg  PO daily   GERD --Continue Protonix 80mg  PO daily and Pepcid 20mg  PO daily   Tobacco use Counseled on need for complete cessation   Anxiety and depression --Continue amitriptyline 50mg  PO qHS and Cymbalta 60mg  PO daily.  Fungal rash left stump Patient reports was wearing her stump sock for several days without changing now with slightly irritated pruritic rash to left stump.  No concerning areas of  fluctuance/erythema. --Clotrimazole topically twice daily x 7 days   DVT prophylaxis: Place TED hose Start: 09/06/21 1507 SCDs Start: 08/31/21 1058    Code Status: Full Code Family Communication: No family present at bedside this morning  Disposition Plan:  Level of care: Med-Surg Status is: Inpatient Remains inpatient appropriate because: Needs HD seat set up before stable for discharge    Consultants:  Nephrology Vascular surgery Orthopedics Infectious disease  Procedures:  I&D's with tissue arrangement. Fistula branch ligation.   Antimicrobials:  Ceftriaxone 10/15 - 10/18 Doxycycline 10/14 - 10/14 Cefazolin 7/1 - 7/1, 7/20 - 7/20 Daptomycin 6/29 - 8/11   Subjective: Patient seen examined bedside, resting comfortably.  Lying in bed.  Upset that she did not receive yogurt with her breakfast this morning.  Otherwise no other complaints.  Next HD planned for tomorrow.  Denies headache, no dizziness, no chest pain, no palpitations, no shortness of breath, no abdominal pain, no fever/chills/night sweats, no nausea/vomiting/diarrhea, no focal weakness, no fatigue, no paresthesias.  No acute events overnight per nursing staff.  Objective: Vitals:   01/25/22 1621 01/25/22 1940 01/26/22 0506 01/26/22 0902  BP: (!) 153/57 (!) 160/56 (!) 153/56 (!) 181/55  Pulse: 70 66 (!) 59 (!) 59  Resp: 18 18 18 19   Temp: 98.3 F (36.8 C) 98 F (36.7 C) 97.9 F (36.6 C) 98.2 F (36.8 C)  TempSrc: Oral Oral Oral Oral  SpO2: 99% 98% 100% 98%  Weight:      Height:        Intake/Output Summary (Last 24 hours) at 01/26/2022 1008 Last data filed at 01/26/2022 0800 Gross per 24 hour  Intake 1040 ml  Output --  Net 1040 ml   Filed Weights   01/21/22 1310 01/24/22 0902 01/24/22 1344  Weight: 93.3 kg 97.8 kg 93.4 kg    Examination:  Physical Exam: GEN: NAD, alert and oriented x 3, chronically ill appearance, appears older than stated age HEENT: NCAT, PERRL, EOMI, sclera clear,  MMM PULM: CTAB w/o wheezes/crackles, normal respiratory effort CV: RRR w/o M/G/R GI: abd soft, NTND, NABS, no R/G/M MSK: Left BKA noted, left upper extremity AV fistula noted, moves all extremities independently NEURO: CN II-XII intact, no focal deficits, sensation to light touch intact PSYCH: normal mood/affect Integumentary: Lacy rash noted to left thumb, otherwise no other concerning rashes/lesions/wounds noted on exposed skin surfaces.     Data Reviewed: I have personally reviewed following labs and imaging studies  CBC: Recent Labs  Lab 01/24/22 0940  WBC 7.6  HGB 10.4*  HCT 31.3*  MCV 89.2  PLT 324   Basic Metabolic Panel: Recent Labs  Lab 01/24/22 0324  NA 127*  K 5.1  CL 86*  CO2 25  GLUCOSE 172*  BUN 80*  CREATININE 6.58*  CALCIUM 9.4  PHOS 5.7*   GFR: Estimated Creatinine Clearance: 12.1 mL/min (A) (by C-G formula based on SCr of 6.58 mg/dL (H)). Liver Function Tests: Recent Labs  Lab 01/24/22 0324  ALBUMIN 3.3*   No results for input(s): "LIPASE", "AMYLASE" in the last 168 hours. No results for input(s): "AMMONIA" in the last 168 hours. Coagulation Profile: No results for input(s): "INR", "PROTIME" in the last 168 hours. Cardiac Enzymes: No results for input(s): "CKTOTAL", "CKMB", "CKMBINDEX", "TROPONINI" in the last 168 hours. BNP (last 3 results) No results  for input(s): "PROBNP" in the last 8760 hours. HbA1C: No results for input(s): "HGBA1C" in the last 72 hours. CBG: Recent Labs  Lab 01/25/22 0727 01/25/22 1129 01/25/22 1628 01/25/22 2123 01/26/22 0710  GLUCAP 109* 233* 152* 143* 150*   Lipid Profile: No results for input(s): "CHOL", "HDL", "LDLCALC", "TRIG", "CHOLHDL", "LDLDIRECT" in the last 72 hours. Thyroid Function Tests: No results for input(s): "TSH", "T4TOTAL", "FREET4", "T3FREE", "THYROIDAB" in the last 72 hours. Anemia Panel: No results for input(s): "VITAMINB12", "FOLATE", "FERRITIN", "TIBC", "IRON", "RETICCTPCT" in the  last 72 hours. Sepsis Labs: No results for input(s): "PROCALCITON", "LATICACIDVEN" in the last 168 hours.  No results found for this or any previous visit (from the past 240 hour(s)).       Radiology Studies: No results found.      Scheduled Meds:  (feeding supplement) PROSource Plus  30 mL Oral BID BM   acidophilus  1 capsule Oral Daily   alosetron  1 mg Oral BID   amitriptyline  50 mg Oral QHS   atorvastatin  40 mg Oral q1800   carvedilol  3.125 mg Oral BID   Chlorhexidine Gluconate Cloth  6 each Topical Q0600   clotrimazole   Topical BID   darbepoetin (ARANESP) injection - DIALYSIS  100 mcg Subcutaneous Q Fri-1800   docusate sodium  200 mg Oral BID   DULoxetine  60 mg Oral Daily   famotidine  20 mg Oral Daily   fentaNYL  1 patch Transdermal Q72H   furosemide  80 mg Oral Daily   heparin sodium (porcine)  2,000 Units Intravenous Once   hydrALAZINE  50 mg Oral TID   insulin aspart  0-9 Units Subcutaneous TID WC   insulin aspart  6 Units Subcutaneous TID WC   insulin glargine-yfgn  10 Units Subcutaneous QHS   methocarbamol  500 mg Oral QID   multivitamin  1 tablet Oral QHS   pantoprazole  80 mg Oral Daily   polyethylene glycol  17 g Oral Daily   sevelamer carbonate  2,400 mg Oral TID WC   sodium zirconium cyclosilicate  10 g Oral Once per day on Sun Tue Thu Sat   Continuous Infusions:  albumin human       LOS: 153 days    Time spent: 48 minutes spent on chart review, discussion with nursing staff, consultants, updating family and interview/physical exam; more than 50% of that time was spent in counseling and/or coordination of care.    Alliana Mcauliff J [place] DO [facility] Available via Epic secure chat 7am-7pm After these hours, please refer to coverage provider listed on [email or web address] 01/26/2022, 10:08 AM

## 2022-01-27 DIAGNOSIS — M86252 Subacute osteomyelitis, left femur: Secondary | ICD-10-CM | POA: Diagnosis not present

## 2022-01-27 LAB — GLUCOSE, CAPILLARY
Glucose-Capillary: 143 mg/dL — ABNORMAL HIGH (ref 70–99)
Glucose-Capillary: 231 mg/dL — ABNORMAL HIGH (ref 70–99)
Glucose-Capillary: 90 mg/dL (ref 70–99)
Glucose-Capillary: 139 mg/dL — ABNORMAL HIGH (ref 70–99)

## 2022-01-27 NOTE — Progress Notes (Signed)
TOC continuing to follow. Barriers for placement still in place.   Gilmore Laroche, MSW, [facility]

## 2022-01-27 NOTE — Progress Notes (Signed)
[name] Progress Note   Subjective:   Patient seen and examined at bedside.  Plan for HD in the afternoon so she can have PT in the AM.  Denies CP, SOB, abdominal pain and n/v/d.   Objective Vitals:   01/26/22 1648 01/26/22 2259 01/27/22 0651 01/27/22 0842  BP: (!) 181/64 (!) 157/57 (!) 152/50 (!) 168/65  Pulse: 60 (!) 58 69 67  Resp: 18  18 18   Temp: 98.1 F (36.7 C) 97.9 F (36.6 C) 98.4 F (36.9 C) 98.8 F (37.1 C)  TempSrc: Oral Oral Oral Oral  SpO2: 100% 98% 98% 98%  Weight:      Height:       Physical Exam General:well appearing female in NAD Heart:RRR, no mrg Lungs:CTAB, nml WOB Abdomen:soft, NTND Extremities:1+ LE edema B/L, L BKA Dialysis Access: LU AVF +b/t   Filed Weights   01/21/22 1310 01/24/22 0902 01/24/22 1344  Weight: 93.3 kg 97.8 kg 93.4 kg    Intake/Output Summary (Last 24 hours) at 01/27/2022 1847 Last data filed at 01/27/2022 0900 Gross per 24 hour  Intake 360 ml  Output 750 ml  Net -390 ml    Additional Objective Labs: Basic Metabolic Panel: Recent Labs  Lab 01/24/22 0324  NA 127*  K 5.1  CL 86*  CO2 25  GLUCOSE 172*  BUN 80*  CREATININE 6.58*  CALCIUM 9.4  PHOS 5.7*   Liver Function Tests: Recent Labs  Lab 01/24/22 0324  ALBUMIN 3.3*   CBC: Recent Labs  Lab 01/24/22 0940  WBC 7.6  HGB 10.4*  HCT 31.3*  MCV 89.2  PLT 235   Blood Culture    Component Value Date/Time   SDES TISSUE 08/28/2021 0917   SPECREQUEST LEFT THIGH TISSUE 08/28/2021 0917   CULT  08/28/2021 0917    MODERATE ENTEROCOCCUS FAECIUM VANCOMYCIN RESISTANT ENTEROCOCCUS ISOLATED NO ANAEROBES ISOLATED Sent to [facility] for further susceptibility testing. Performed at [facility], [place] [address], [place], [place] [address]    REPTSTATUS 09/02/2021 FINAL 08/28/2021 0917    CBG: Recent Labs  Lab 01/26/22 1648 01/26/22 2305 01/27/22 0730 01/27/22 1131 01/27/22 1553  GLUCAP 147* 163* 143* 231* 90    Medications:   albumin human      (feeding supplement) PROSource Plus  30 mL Oral BID BM   acidophilus  1 capsule Oral Daily   alosetron  1 mg Oral BID   amitriptyline  50 mg Oral QHS   atorvastatin  40 mg Oral q1800   carvedilol  3.125 mg Oral BID   Chlorhexidine Gluconate Cloth  6 each Topical Q0600   clotrimazole   Topical BID   darbepoetin (ARANESP) injection - DIALYSIS  100 mcg Subcutaneous Q Fri-1800   docusate sodium  200 mg Oral BID   DULoxetine  60 mg Oral Daily   famotidine  20 mg Oral Daily   fentaNYL  1 patch Transdermal Q72H   furosemide  80 mg Oral Daily   heparin sodium (porcine)  2,000 Units Intravenous Once   hydrALAZINE  50 mg Oral TID   insulin aspart  0-9 Units Subcutaneous TID WC   insulin aspart  6 Units Subcutaneous TID WC   insulin glargine-yfgn  10 Units Subcutaneous QHS   methocarbamol  500 mg Oral QID   multivitamin  1 tablet Oral QHS   pantoprazole  80 mg Oral Daily   polyethylene glycol  17 g Oral Daily   sevelamer carbonate  2,400 mg Oral TID WC  sodium zirconium cyclosilicate  10 g Oral Once per day on Sun Tue Thu Sat    Dialysis Orders: MWF at [facility], 400/500, EDW 90kg, 2K/2Ca, LUE AVF - heparin 4000 unit IV bolus, no ESA   Assessment/Plan: 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 Completed 6 wk course of IV Daptomycin on 10/12/21. Per Dr. Jess Barters note 12/28/2021 "NO RESTRICTIONS L HIP".  Chronic L hip/residual limb pain: She does have fractures of L femoral neck (where hardware was removed) as well as distal femur Fx. Pain control per primary.  ESRD: Usual MWF schedule. Clots HD system without heparin, 4000 unit bolus and 2000 unit mid-run bolus.  Hyperkalemia: Stable on scheduled lokelma 4 days a week.  HTN/volume: BP meds have been tapered down - currently on Coreg 3.125mg  BID only. Large fluids gains between HD which has been discussed extensively, Ongoing edema  continue max UF as tolerated. Anemia of  ESRD: Hgb 9-10s stable. Last tsat 54%. Continue Aranesp 142mcg weekly.  Secondary HPTH: CorrCa slightly high, not on VDRA. Phos higher today, continue Renvela as binder. Nutrition: Alb low, continue protein supplements.  Renal diet w/fluid restrictions.   AVF dysfunction (resolved): S/p branch ligation 09/19/2021  Dr. Virl Cagey. AVF working well now. DM2: Insulin per primary Dispo: SNF previously recommended, but she declined. Not home HD candidate.  Dialyzed in recliner on 9/4 but signed off early due to pain, has declined to come in a recliner since then.  Strongly discussed on the importance of increasing her mobility as much as possible while she is in her room in addition to following her ongoing fluid restriction. Also reinforced the importance of being in the chair for HD treatments to improve strength and mobility. Does not like to do PT after HD, will try to do HD in the afternoons so she can do PT in the AM. Per Renal Navigator notes there are no clinics that will accept patient unless she will have HD in chair. Will continue to encourage use of chair for HD.  Jen Mow, PA-C [facility] 01/27/2022,6:47 PM  LOS: 154 days

## 2022-01-27 NOTE — Progress Notes (Signed)
PROGRESS NOTE    [name]  MVH:[id number] DOB: 10-Jul-1965 DOA: 08/26/2021 PCP: [name], MD    Brief Narrative:   [name] is a 56 y.o. female with past medical history significant for hypertension, hyperlipidemia, diabetes mellitus type 2, end-stage renal disease on hemodialysis, bipolar disorder, status post left BKA for chronic calcaneus osteomyelitis on 01/12/2020, left distal femoral fracture after a fall on 07/19/2021 deemed nonoperative, admission from 08/03/2021-08/23/2021 for left femoral osteomyelitis requiring partial resection of femur and rearrangements of bony fragments with subsequent antibiotic treatment presented to the hospital with left hip bleeding and was found to have subacute osteomyelitis and abscess of left femur.  Orthopedic/ID were consulted and patient was started on broad-spectrum antibiotics.  Underwent multiple I&D's by Dr. [name] along with local tissue rearrangement.  She was also seen by vascular surgery who did  fistula branch ligation.  She has completed 6-week course of daptomycin on 10/09/2021.    She has been unable to complete hemodialysis in chair and outpatient hemodialysis unit has been hard to find.  She is otherwise medically stable for discharge.   Assessment & Plan:   Subacute osteomyelitis of left femur with abscess Status post surgical intervention by Dr. [name] with multiple left hip debridements last on 08/31/2021.  Completed a 6-week course of daptomycin on 10/11/2021.  Repeat imaging on 12/02/2021 of left knee showed healing fracture with callus.  Orthopedics Dr [name] has followed and is doing well.  No left hip restrictions at this time.   --Fentanyl patch 12 mcg every 72 hours --Oxycodone 500 mg p.o. every 4 hours as needed moderate pain --Robaxin 5 mg p.o. 4 times daily   Chronic pain.    --Fentanyl patch 12 mcg every 72 hours  --Oxycodone 5 mg PO q4h PRN moderate pain --Robaxin 500 mg p.o. 4 times daily --Cymbalta 60 mg p.o.  daily --Amitriptyline 50 mg p.o. nightly  Anterior chest wall pain.   Abdominal x-ray was negative. Appears to be chest wall related.  Continue warm compress.  Continue muscle relaxant and oxycodone.    Right thigh mild cellulitis: resolved Improved, received a course of antibiotic in the past.  Likely from anasarca.     History of left intertrochanteric fracture Left hip hardware removed on 08/10/2021 prior to admission.  Physical therapy working to scoot and mobilize with wheelchair.  Will need at least 4 hours to be able to be considered for outpatient hemodialysis.  Currently has not been able to do much including lack of motivation.   End-stage renal disease on hemodialysis Anasarca --Nephrology following; appreciate assistance --Renal diet with 1.2 L fluid restriction --continue dialysis as per nephrology.    --Strict intake and output, daily weights --intermittent Lokelma for hyperkalemia, last Potassium  5.1 on 11/24 --Awaiting outpatient hemodialysis chair.   Hyponatremia Latest sodium of 127.  Managed with hemodialysis.   Hyperphosphatemia --continue sevelamer.   Anemia of chronic disease Latest hemoglobin  at 10.4.  Continue to monitor.   Diabetes mellitus type 2 with hyperglycemia Hemoglobin A1c 5.9 on 11/14/2021. --Semglee 10u [place] daily --Novolog 6u TIDAC --Sensitive SSI for coverage --CBGs qAC/HS  Essential hypertension --Coreg 3.125mg  PO BID --Hydralazine 50mg  PO TID --Lasix 80mg  PO daily  History of unspecified CVA Hyperlipidemia Continue Lipitor 40mg  PO daily   GERD --Continue Protonix 80mg  PO daily and Pepcid 20mg  PO daily   Tobacco use Counseled on need for complete cessation   Anxiety and depression --Continue amitriptyline 50mg  PO qHS and Cymbalta 60mg  PO daily.  Fungal rash left stump Patient reports was wearing her stump sock for several days without changing now with slightly irritated pruritic rash to left stump.  No concerning areas of  fluctuance/erythema. --Clotrimazole topically twice daily x 7 days   DVT prophylaxis: Place TED hose Start: 09/06/21 1507 SCDs Start: 08/31/21 1058    Code Status: Full Code Family Communication: No family present at bedside this morning  Disposition Plan:  Level of care: Med-Surg Status is: Inpatient Remains inpatient appropriate because: Needs HD seat set up before stable for discharge    Consultants:  Nephrology Vascular surgery Orthopedics Infectious disease  Procedures:  I&D's with tissue arrangement. Fistula branch ligation.   Antimicrobials:  Ceftriaxone 10/15 - 10/18 Doxycycline 10/14 - 10/14 Cefazolin 7/1 - 7/1, 7/20 - 7/20 Daptomycin 6/29 - 8/11   Subjective: Patient seen examined bedside, resting comfortably.  Lying in bed.  Needs to complaint of not receiving yogurt.  Otherwise no other complaints.  Next HD planned for today.  Denies headache, no dizziness, no chest pain, no palpitations, no shortness of breath, no abdominal pain, no fever/chills/night sweats, no nausea/vomiting/diarrhea, no focal weakness, no fatigue, no paresthesias.  No acute events overnight per nursing staff.  Objective: Vitals:   01/26/22 1648 01/26/22 2259 01/27/22 0651 01/27/22 0842  BP: (!) 181/64 (!) 157/57 (!) 152/50 (!) 168/65  Pulse: 60 (!) 58 69 67  Resp: 18  18 18   Temp: 98.1 F (36.7 C) 97.9 F (36.6 C) 98.4 F (36.9 C) 98.8 F (37.1 C)  TempSrc: Oral Oral Oral Oral  SpO2: 100% 98% 98% 98%  Weight:      Height:        Intake/Output Summary (Last 24 hours) at 01/27/2022 0858 Last data filed at 01/27/2022 0600 Gross per 24 hour  Intake 600 ml  Output 75 ml  Net 525 ml   Filed Weights   01/21/22 1310 01/24/22 0902 01/24/22 1344  Weight: 93.3 kg 97.8 kg 93.4 kg    Examination:  Physical Exam: GEN: NAD, alert and oriented x 3, chronically ill appearance, appears older than stated age HEENT: NCAT, PERRL, EOMI, sclera clear, MMM PULM: CTAB w/o  wheezes/crackles, normal respiratory effort CV: RRR w/o M/G/R GI: abd soft, NTND, NABS, no R/G/M MSK: Left BKA noted, left upper extremity AV fistula noted, moves all extremities independently NEURO: CN II-XII intact, no focal deficits, sensation to light touch intact PSYCH: normal mood/affect Integumentary: Lacy rash noted to left thumb, otherwise no other concerning rashes/lesions/wounds noted on exposed skin surfaces.     Data Reviewed: I have personally reviewed following labs and imaging studies  CBC: Recent Labs  Lab 01/24/22 0940  WBC 7.6  HGB 10.4*  HCT 31.3*  MCV 89.2  PLT 568   Basic Metabolic Panel: Recent Labs  Lab 01/24/22 0324  NA 127*  K 5.1  CL 86*  CO2 25  GLUCOSE 172*  BUN 80*  CREATININE 6.58*  CALCIUM 9.4  PHOS 5.7*   GFR: Estimated Creatinine Clearance: 12.1 mL/min (A) (by C-G formula based on SCr of 6.58 mg/dL (H)). Liver Function Tests: Recent Labs  Lab 01/24/22 0324  ALBUMIN 3.3*   No results for input(s): "LIPASE", "AMYLASE" in the last 168 hours. No results for input(s): "AMMONIA" in the last 168 hours. Coagulation Profile: No results for input(s): "INR", "PROTIME" in the last 168 hours. Cardiac Enzymes: No results for input(s): "CKTOTAL", "CKMB", "CKMBINDEX", "TROPONINI" in the last 168 hours. BNP (last 3 results) No results for input(s): "PROBNP" in the  last 8760 hours. HbA1C: No results for input(s): "HGBA1C" in the last 72 hours. CBG: Recent Labs  Lab 01/26/22 0710 01/26/22 1102 01/26/22 1648 01/26/22 2305 01/27/22 0730  GLUCAP 150* 209* 147* 163* 143*   Lipid Profile: No results for input(s): "CHOL", "HDL", "LDLCALC", "TRIG", "CHOLHDL", "LDLDIRECT" in the last 72 hours. Thyroid Function Tests: No results for input(s): "TSH", "T4TOTAL", "FREET4", "T3FREE", "THYROIDAB" in the last 72 hours. Anemia Panel: No results for input(s): "VITAMINB12", "FOLATE", "FERRITIN", "TIBC", "IRON", "RETICCTPCT" in the last 72  hours. Sepsis Labs: No results for input(s): "PROCALCITON", "LATICACIDVEN" in the last 168 hours.  No results found for this or any previous visit (from the past 240 hour(s)).       Radiology Studies: No results found.      Scheduled Meds:  (feeding supplement) PROSource Plus  30 mL Oral BID BM   acidophilus  1 capsule Oral Daily   alosetron  1 mg Oral BID   amitriptyline  50 mg Oral QHS   atorvastatin  40 mg Oral q1800   carvedilol  3.125 mg Oral BID   Chlorhexidine Gluconate Cloth  6 each Topical Q0600   clotrimazole   Topical BID   darbepoetin (ARANESP) injection - DIALYSIS  100 mcg Subcutaneous Q Fri-1800   docusate sodium  200 mg Oral BID   DULoxetine  60 mg Oral Daily   famotidine  20 mg Oral Daily   fentaNYL  1 patch Transdermal Q72H   furosemide  80 mg Oral Daily   heparin sodium (porcine)  2,000 Units Intravenous Once   hydrALAZINE  50 mg Oral TID   insulin aspart  0-9 Units Subcutaneous TID WC   insulin aspart  6 Units Subcutaneous TID WC   insulin glargine-yfgn  10 Units Subcutaneous QHS   methocarbamol  500 mg Oral QID   multivitamin  1 tablet Oral QHS   pantoprazole  80 mg Oral Daily   polyethylene glycol  17 g Oral Daily   sevelamer carbonate  2,400 mg Oral TID WC   sodium zirconium cyclosilicate  10 g Oral Once per day on Sun Tue Thu Sat   Continuous Infusions:  albumin human       LOS: 154 days    Time spent: 48 minutes spent on chart review, discussion with nursing staff, consultants, updating family and interview/physical exam; more than 50% of that time was spent in counseling and/or coordination of care.    Samson Ralph J [place] DO [facility] Available via Epic secure chat 7am-7pm After these hours, please refer to coverage provider listed on [email or web address] 01/27/2022, 8:58 AM

## 2022-01-27 NOTE — Progress Notes (Signed)
Physical Therapy Treatment Patient Details Name: [name] MRN: [id number] DOB: [birth year] Today's Date: 01/27/2022   History of Present Illness 56 y.o. female presented to ED 6/26 from dialysis with increased bloody drainage from L hip wound. 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 s/p hardware removal of left hip with partial resection of tuft tissue and femure that were nonviable on 08/10/21 Discharged home 6/23. underwent L hip debridement and wound vac placement 6/28; +Left intertrochanteric non-union,  Fracture of left distal femur  PMH: hypertension, hyperlipidemia, ESRD on HD MWF, history of left BKA in 2021, depression/anxiety, stroke, tobacco use, T2DM,  insomnia, chronic pain syndrome,    PT Comments    Pt seems to have new motivation to work with therapies today; States she wants to be home and walking by Christmas; Discussed that to have best hope at meeting this goal, she will need daily therapies, more time OOB, and to work hard; Informed her that she will get more therapies at post-acute rehab (SNF level), and she states she wants to go home from here; also gently pointed out that the goal of walking is not realistic in her stated timeframe of 3-4 weeks; If she can have solid, reliable assist and get medical [name] transport services for to/from HD, getting home to a private residence (with [facility] therapies) is not entirely out of the question -- but I don't think much assist is available to her, and in the absence of reliable and consistent assist home is not safe;   Did not specifically talk about HD in the recliner, however that will need to happen for her to be able to undergo HD in the outpt setting (will specifically revisit HD in the recliner next session);   I'm noting some misconnections with education/memory and carry through with [name]; We had quite a discussion re: prosthesis use when prosthetist was in a few weeks ago, including stressing  no powder under liner, and liner wear in the day and shrinker wear at night; pt has not followed these guidelines for liner and shrinker wear, and she asked this PT about using powder under her liner recently; Posted a sign in her room for liner and shrinker wear; it will also be worth considering consulting Speech Language Pathologist for a closer look at her cognition --- have memory deficits gotten in the way of her participation in her care more than we've realized?  Prosthesis did not fit today; with consistent liner and shrinker wearing, should be able to get it on soon; Worked on functional transfers bed to wheelchair and wheelchair propulsion and parts management; used a blue air geomat cushion with 2 yellwo eggcrate pieces on top of it in a pillow case for a cushion for her wheelchair with some success; pt wheeled about her room, including getting to  the bathroom; We discussed options for getting on the commode and options for the drop-arm bSC for a shower chair as well; Pt agrees to OT consult; will place order;   In today's session, [name] was more engaged in problem-solving and moving than this PT has seen; It sounds like spending Thanksgiving in the hospital was difficult; [name]'s best hope for meeting her stated goal will be to get to post-acute rehab for more intensive and consistent PT, OT, and perhaps ST (depending on SLP colleague's assessment); will continue to follow, and consider increasing frequency of PT, especially if her motivation continues at today's level.       Recommendations  for follow up therapy are one component of a multi-disciplinary discharge planning process, led by the attending physician.  Recommendations may be updated based on patient status, additional functional criteria and insurance authorization.  Follow Up Recommendations  Skilled nursing-short term rehab (<3 hours/day) Can patient physically be transported by private vehicle: No   Assistance  Recommended at Discharge Intermittent Supervision/Assistance  Patient can return home with the following A lot of help with bathing/dressing/bathroom;Assistance with cooking/housework;Assist for transportation;Two people to help with walking and/or transfers;Help with stairs or ramp for entrance   Equipment Recommendations  Other (comment) (Sliding board, drop-arm BSC; needs a wheelchair cushion; would need medical [name] transport for HD)    Recommendations for Other Services OT consult;Speech consult     Precautions / Restrictions Precautions Precautions: Fall;Other (comment) Precaution Comments: L BKA (baseline) Restrictions LLE Weight Bearing: Weight bearing as tolerated Other Position/Activity Restrictions: L distal femur fx; per Dr [name] 8/10 pt can full WB; Wear liner during the day and shrinker overnight     Mobility  Bed Mobility Overal bed mobility: Needs Assistance Bed Mobility: Supine to Sit, Sit to Supine     Supine to sit: Min assist, Min guard Sit to supine: Modified independent (Device/Increase time)   General bed mobility comments: Cues for technique    Transfers Overall transfer level: Needs assistance Equipment used: Sliding board Transfers: Bed to chair/wheelchair/BSC            Lateral/Scoot Transfers: Min assist, +2 safety/equipment General transfer comment: Laterally scooted bed to WC, then wc to bed at end of session; assist to place sliding board; assist to stabilize wc; cues for more weihgt shift onto L foot to unweigh hips for scooting    Ambulation/Gait                   Theme park manager mobility: Yes Wheelchair propulsion: Both upper extremities, Right lower extremity Wheelchair parts: Needs assistance Distance: 15 (in room, around obstacles, including into bathroom) Wheelchair Assistance Details (indicate cue type and reason): Mostly supervision, allowing time for trial  and error with propulsion and steer; Used brakes well; will need reinforcement of legrest management and armrest management  Modified Rankin (Stroke Patients Only)       Balance     Sitting balance-Leahy Scale: Fair (approaching Good)                                      Cognition Arousal/Alertness: Awake/alert Behavior During Therapy: WFL for tasks assessed/performed Overall Cognitive Status: Impaired/Different from baseline Area of Impairment: Memory, Safety/judgement                     Memory: Decreased short-term memory   Safety/Judgement: Decreased awareness of deficits, Decreased awareness of safety     General Comments: States she wants to be home and walking by Christmas; Discussed that to have best hope at meeting this goal, she will need daily therapies, more time OOB, and to work hard; Informed her that she will get more therapies at post-acute rehab (SNF level), and she states she wants to go home from here; We had quite a discussion re: prosthesis use when prosthetist was in a few weeks ago, including stressing no powder under liner, and liner wear in the day and shrinker wear at night; pt has  not followed these guidelines for liner and shrinker wear, and she asked this PT about using powder under her liner        Exercises      General Comments General comments (skin integrity, edema, etc.): Lengthy discussion re: her goal fo walking and home by Christmas, including discussing ADLs; Pt agrees to OT consult; will also consider ST consult for cognition/memory      Pertinent Vitals/Pain Pain Assessment Pain Assessment: Faces Faces Pain Scale: Hurts a little bit Pain Location: back pain and L knee/hip pain, esp with trying to don prosthesis Pain Descriptors / Indicators: Discomfort, Grimacing, Guarding Pain Intervention(s): Premedicated before session    Home Living                          Prior Function            PT  Goals (current goals can now be found in the care plan section) Acute Rehab PT Goals Patient Stated Goal: Stated she wants to be able to be home and walk by Christmas PT Goal Formulation: With patient Time For Goal Achievement: 01/23/22 Potential to Achieve Goals: Fair Progress towards PT goals: Progressing toward goals (slowly)    Frequency    Min 2X/week      PT Plan Current plan remains appropriate    Co-evaluation              AM-PAC PT "6 Clicks" Mobility   Outcome Measure  Help needed turning from your back to your side while in a flat bed without using bedrails?: None Help needed moving from lying on your back to sitting on the side of a flat bed without using bedrails?: A Little Help needed moving to and from a bed to a chair (including a wheelchair)?: A Little Help needed standing up from a chair using your arms (e.g., wheelchair or bedside chair)?: Total Help needed to walk in hospital room?: Total Help needed climbing 3-5 steps with a railing? : Total 6 Click Score: 13    End of Session Equipment Utilized During Treatment:  (sliding board) Activity Tolerance: Patient tolerated treatment well Patient left: in bed;with call bell/phone within reach Nurse Communication: Mobility status PT Visit Diagnosis: Other abnormalities of gait and mobility (R26.89);Muscle weakness (generalized) (M62.81);History of falling (Z91.81);Pain Pain - Right/Left: Left Pain - part of body: Leg     Time: 1117-1228 PT Time Calculation (min) (ACUTE ONLY): 71 min  Charges:  $Therapeutic Activity: 23-37 mins $Self Care/Home Management: 8-22 $Wheel Chair Management: 23-37 mins                     Roney Marion, PT  Acute Rehabilitation Services Office [phone]    Colletta Maryland 01/27/2022, 3:15 PM

## 2022-01-27 NOTE — Progress Notes (Signed)
Pt stated she was not going to Dialysis this late. RN called and spoke with HD nurse Obas and informed RN that pt does not want to go this late. RN stated he would move pt's time to the am per patient's request.   [name] [facility]

## 2022-01-27 NOTE — Progress Notes (Deleted)
[name] Progress Note   Subjective:   Patient seen and examined at bedside.  Plan for HD in the afternoon so she can have PT in the AM.  Denies CP, SOB, abdominal pain and n/v/d.    Objective Vitals:   01/26/22 1648 01/26/22 2259 01/27/22 0651 01/27/22 0842  BP: (!) 181/64 (!) 157/57 (!) 152/50 (!) 168/65  Pulse: 60 (!) 58 69 67  Resp: 18  18 18   Temp: 98.1 F (36.7 C) 97.9 F (36.6 C) 98.4 F (36.9 C) 98.8 F (37.1 C)  TempSrc: Oral Oral Oral Oral  SpO2: 100% 98% 98% 98%  Weight:      Height:       Physical Exam General:well appearing female in NAD Heart:RRR, no mrg Lungs:CTAB, nml WOB Abdomen:soft, NTND Extremities:1+ LE edema B/L, L BKA Dialysis Access: LU AVF +b/t   Filed Weights   01/21/22 1310 01/24/22 0902 01/24/22 1344  Weight: 93.3 kg 97.8 kg 93.4 kg    Intake/Output Summary (Last 24 hours) at 01/27/2022 1355 Last data filed at 01/27/2022 0900 Gross per 24 hour  Intake 600 ml  Output 750 ml  Net -150 ml    Additional Objective Labs: Basic Metabolic Panel: Recent Labs  Lab 01/24/22 0324  NA 127*  K 5.1  CL 86*  CO2 25  GLUCOSE 172*  BUN 80*  CREATININE 6.58*  CALCIUM 9.4  PHOS 5.7*   Liver Function Tests: Recent Labs  Lab 01/24/22 0324  ALBUMIN 3.3*   CBC: Recent Labs  Lab 01/24/22 0940  WBC 7.6  HGB 10.4*  HCT 31.3*  MCV 89.2  PLT 235    CBG: Recent Labs  Lab 01/26/22 1102 01/26/22 1648 01/26/22 2305 01/27/22 0730 01/27/22 1131  GLUCAP 209* 147* 163* 143* 231*   Medications:  albumin human      (feeding supplement) PROSource Plus  30 mL Oral BID BM   acidophilus  1 capsule Oral Daily   alosetron  1 mg Oral BID   amitriptyline  50 mg Oral QHS   atorvastatin  40 mg Oral q1800   carvedilol  3.125 mg Oral BID   Chlorhexidine Gluconate Cloth  6 each Topical Q0600   clotrimazole   Topical BID   darbepoetin (ARANESP) injection - DIALYSIS  100 mcg Subcutaneous Q Fri-1800   docusate sodium  200 mg  Oral BID   DULoxetine  60 mg Oral Daily   famotidine  20 mg Oral Daily   fentaNYL  1 patch Transdermal Q72H   furosemide  80 mg Oral Daily   heparin sodium (porcine)  2,000 Units Intravenous Once   hydrALAZINE  50 mg Oral TID   insulin aspart  0-9 Units Subcutaneous TID WC   insulin aspart  6 Units Subcutaneous TID WC   insulin glargine-yfgn  10 Units Subcutaneous QHS   methocarbamol  500 mg Oral QID   multivitamin  1 tablet Oral QHS   pantoprazole  80 mg Oral Daily   polyethylene glycol  17 g Oral Daily   sevelamer carbonate  2,400 mg Oral TID WC   sodium zirconium cyclosilicate  10 g Oral Once per day on Sun Tue Thu Sat    Dialysis Orders: MWF at [facility], 400/500, EDW 90kg, 2K/2Ca, LUE AVF - heparin 4000 unit IV bolus, no ESA   Assessment/Plan: 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 Completed 6 wk course of IV Daptomycin on 10/12/21. Per Dr. Jess Barters note 12/28/2021 "NO RESTRICTIONS  L HIP".  Chronic L hip/residual limb pain: She does have fractures of L femoral neck (where hardware was removed) as well as distal femur Fx. Pain control per primary.  ESRD: Usual MWF schedule. Clots HD system without heparin, 4000 unit bolus and 2000 unit mid-run bolus.  Hyperkalemia: Stable on scheduled lokelma 4 days a week.  HTN/volume: BP meds have been tapered down - currently on Coreg 3.125mg  BID only. Large fluids gains between HD which has been discussed extensively, Ongoing edema  continue max UF as tolerated. Anemia of ESRD: Hgb 9-10s stable. Last tsat 54%. Continue Aranesp 176mcg weekly.  Secondary HPTH: CorrCa slightly high, not on VDRA. Phos higher today, continue Renvela as binder. Nutrition: Alb low, continue protein supplements.  Renal diet w/fluid restrictions.   AVF dysfunction (resolved): S/p branch ligation 09/19/2021  Dr. Virl Cagey. AVF working well now. DM2: Insulin per primary Dispo: SNF previously recommended, but she declined.  Not home HD candidate.  Dialyzed in recliner on 9/4 but signed off early due to pain, has declined to come in a recliner since then.  Strongly discussed on the importance of increasing her mobility as much as possible while she is in her room in addition to following her ongoing fluid restriction. Also reinforced the importance of being in the chair for HD treatments to improve strength and mobility. Does not like to do PT after HD, will try to do HD in the afternoons so she can do PT in the AM. Per Renal Navigator notes there are no clinics that will accept patient unless she will have HD in chair. Will continue to encourage use of chair for HD.  Jen Mow, PA-C [facility] 01/27/2022,1:55 PM  LOS: 154 days

## 2022-01-28 DIAGNOSIS — M86252 Subacute osteomyelitis, left femur: Secondary | ICD-10-CM | POA: Diagnosis not present

## 2022-01-28 LAB — GLUCOSE, CAPILLARY
Glucose-Capillary: 130 mg/dL — ABNORMAL HIGH (ref 70–99)
Glucose-Capillary: 187 mg/dL — ABNORMAL HIGH (ref 70–99)
Glucose-Capillary: 208 mg/dL — ABNORMAL HIGH (ref 70–99)

## 2022-01-28 NOTE — Evaluation (Signed)
Occupational Therapy Re-evaluation Patient Details Name: [name] MRN: [id number] DOB: 1965/09/07 Today's Date: 01/28/2022   History of Present Illness 56 y.o. female presented to ED 6/26 from dialysis with increased bloody drainage from L hip wound. 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 s/p hardware removal of left hip with partial resection of tuft tissue and femure that were nonviable on 08/10/21 Discharged home 6/23. underwent L hip debridement and wound vac placement 6/28; +Left intertrochanteric non-union,  Fracture of left distal femur  PMH: hypertension, hyperlipidemia, ESRD on HD MWF, history of left BKA in 2021, depression/anxiety, stroke, tobacco use, T2DM,  insomnia, chronic pain syndrome,   Clinical Impression   Pt seen for re-evaluation, evaluation limited due to pt headache pain and nausea. Pt needing min-mod A for ADLs, declining bed mobility and transfers at this time. Pt able to complete seated therex at EOB with red theraband, encouraged continued BUE strengthening to prep for transfers and propelling w/c. Pt verbalized understanding. Pt presenting with impairments listed below, will follow acutely. Recommend SNF at d/c pending progression.     Recommendations for follow up therapy are one component of a multi-disciplinary discharge planning process, led by the attending physician.  Recommendations may be updated based on patient status, additional functional criteria and insurance authorization.   Follow Up Recommendations  Skilled nursing-short term rehab (<3 hours/day)     Assistance Recommended at Discharge Intermittent Supervision/Assistance  Patient can return home with the following A lot of help with bathing/dressing/bathroom;Assistance with cooking/housework;Assist for transportation;Help with stairs or ramp for entrance;Two people to help with walking and/or transfers    Functional Status Assessment  Patient has had a  recent decline in their functional status and demonstrates the ability to make significant improvements in function in a reasonable and predictable amount of time.  Equipment Recommendations  None recommended by OT (defer)    Recommendations for Other Services PT consult     Precautions / Restrictions Precautions Precautions: Fall;Other (comment) Precaution Comments: L BKA (baseline) Restrictions Weight Bearing Restrictions: No LLE Weight Bearing: Weight bearing as tolerated Other Position/Activity Restrictions: L distal femur fx; per Dr [name] 8/10 pt can full WB; Wear liner during the day and shrinker overnight      Mobility Bed Mobility               General bed mobility comments: pt declining    Transfers                   General transfer comment: pt declining      Balance       Sitting balance - Comments: unable to assess, pt declined       Standing balance comment: unable to assess, pt declined                           ADL either performed or assessed with clinical judgement   ADL Overall ADL's : Needs assistance/impaired Eating/Feeding: Set up;Bed level   Grooming: Supervision/safety;Sitting   Upper Body Bathing: Minimal assistance;Sitting   Lower Body Bathing: Minimal assistance;Sitting/lateral leans   Upper Body Dressing : Minimal assistance;Sitting   Lower Body Dressing: Minimal assistance;Sitting/lateral leans;Moderate assistance Lower Body Dressing Details (indicate cue type and reason): pulling up liner Toilet Transfer: Minimal assistance;+2 for physical assistance;Transfer board;Moderate assistance Toilet Transfer Details (indicate cue type and reason): for lateral scoot Toileting- Clothing Manipulation and Hygiene: Minimal assistance  Functional mobility during ADLs: Minimal assistance       Vision   Vision Assessment?: No apparent visual deficits     Perception Perception Perception Tested?: No    Praxis Praxis Praxis tested?: Not tested    Pertinent Vitals/Pain Pain Assessment Pain Assessment: Faces Pain Score: 8  Faces Pain Scale: Hurts whole lot Pain Location: headache, back pain Pain Descriptors / Indicators: Discomfort, Grimacing, Guarding Pain Intervention(s): Limited activity within patient's tolerance, Monitored during session, Repositioned     Hand Dominance Right   Extremity/Trunk Assessment Upper Extremity Assessment Upper Extremity Assessment: Overall WFL for tasks assessed   Lower Extremity Assessment Lower Extremity Assessment: Defer to PT evaluation   Cervical / Trunk Assessment Cervical / Trunk Assessment: Normal   Communication Communication Communication: No difficulties   Cognition Arousal/Alertness: Awake/alert Behavior During Therapy: WFL for tasks assessed/performed Overall Cognitive Status: Impaired/Different from baseline Area of Impairment: Memory, Safety/judgement                     Memory: Decreased short-term memory   Safety/Judgement: Decreased awareness of deficits, Decreased awareness of safety Awareness: Emergent   General Comments: pt needing increased motivation to participate in all mobility/ADL tasks, pt self limiting at times, reports main barriers is pain     General Comments  pt states she is motivated to return home by Christmas, however remains self-limiting, main barrier appears to be pain and fatigue after HD    Exercises Exercises: Other exercises General Exercises - Upper Extremity Shoulder Flexion: Strengthening, Both, 10 reps, Theraband, Seated Other Exercises Other Exercises: bedrail push ups x10   Shoulder Instructions      Home Living Family/patient expects to be discharged to:: Private residence Living Arrangements: Children Available Help at Discharge: Family;Available PRN/intermittently Type of Home: Mobile home Home Access: Ramped entrance     Home Layout: One level     Bathroom  Shower/Tub: Occupational psychologist: Standard Bathroom Accessibility: No   Home Equipment: Tax adviser (4 wheels)          Prior Functioning/Environment Prior Level of Function : Needs assist             Mobility Comments: independent with squat pivot transfers to wheelchair, mod I with wheelchair mobility, PTA, per last OT note, pt needing min A +2 for lateral scoot transfers ADLs Comments: daughter provides set up assistance for bathing, indpendent with transfers to [facility] and self dressing, iADLs provided by family        OT Problem List: Decreased strength;Pain;Decreased range of motion;Decreased activity tolerance;Decreased safety awareness;Impaired balance (sitting and/or standing);Decreased knowledge of use of DME or AE;Decreased knowledge of precautions      OT Treatment/Interventions: Self-care/ADL training;Balance training;Therapeutic exercise;Energy conservation;Therapeutic activities;DME and/or AE instruction;Visual/perceptual remediation/compensation;Patient/family education    OT Goals(Current goals can be found in the care plan section) Acute Rehab OT Goals Patient Stated Goal: none stated OT Goal Formulation: With patient Time For Goal Achievement: 02/11/22 Potential to Achieve Goals: Fair ADL Goals Pt Will Perform Upper Body Dressing: Independently;sitting Pt Will Perform Lower Body Dressing: Independently;sitting/lateral leans;bed level;sit to/from stand Pt Will Transfer to Toilet: with min guard assist;with transfer board;anterior/posterior transfer;bedside commode Pt Will Perform Tub/Shower Transfer: Tub transfer;Shower transfer;tub bench;ambulating  OT Frequency: Min 2X/week    Co-evaluation PT/OT/SLP Co-Evaluation/Treatment: Yes Reason for Co-Treatment: To address functional/ADL transfers;For patient/therapist safety;Other (comment) (pt returning from HD)   OT goals addressed during session: ADL's and  self-care;Strengthening/ROM      AM-PAC  OT "6 Clicks" Daily Activity     Outcome Measure Help from another person eating meals?: None Help from another person taking care of personal grooming?: None Help from another person toileting, which includes using toliet, bedpan, or urinal?: A Lot Help from another person bathing (including washing, rinsing, drying)?: A Lot Help from another person to put on and taking off regular upper body clothing?: A Little Help from another person to put on and taking off regular lower body clothing?: A Little 6 Click Score: 18   End of Session Nurse Communication: Mobility status  Activity Tolerance: Patient limited by fatigue;Patient limited by pain Patient left: in bed;with call bell/phone within reach  OT Visit Diagnosis: Muscle weakness (generalized) (M62.81);Unsteadiness on feet (R26.81);History of falling (Z91.81)                Time: 4129-0475 OT Time Calculation (min): 26 min Charges:  OT General Charges $OT Visit: 1 Visit OT Evaluation $OT Re-eval: 1 Re-eval  Renaye Rakers, OTD, OTR/L SecureChat Preferred Acute Rehab [phone]  Renaye Rakers [place] 01/28/2022, 3:25 PM

## 2022-01-28 NOTE — Progress Notes (Signed)
PROGRESS NOTE    DAO MEARNS  VOZ:[id number] DOB: Jul 25, 1965 DOA: 08/26/2021 PCP: [name], MD    Brief Narrative:   [name] is a 56 y.o. female with past medical history significant for hypertension, hyperlipidemia, diabetes mellitus type 2, end-stage renal disease on hemodialysis, bipolar disorder, status post left BKA for chronic calcaneus osteomyelitis on 01/12/2020, left distal femoral fracture after a fall on 07/19/2021 deemed nonoperative, admission from 08/03/2021-08/23/2021 for left femoral osteomyelitis requiring partial resection of femur and rearrangements of bony fragments with subsequent antibiotic treatment presented to the hospital with left hip bleeding and was found to have subacute osteomyelitis and abscess of left femur.  Orthopedic/ID were consulted and patient was started on broad-spectrum antibiotics.  Underwent multiple I&D's by Dr. [name] along with local tissue rearrangement.  She was also seen by vascular surgery who did  fistula branch ligation.  She has completed 6-week course of daptomycin on 10/09/2021.    She has been unable to complete hemodialysis in chair and outpatient hemodialysis unit has been hard to find.  She is otherwise medically stable for discharge.   Assessment & Plan:   Subacute osteomyelitis of left femur with abscess Status post surgical intervention by Dr. [name] with multiple left hip debridements last on 08/31/2021.  Completed a 6-week course of daptomycin on 10/11/2021.  Repeat imaging on 12/02/2021 of left knee showed healing fracture with callus.  Orthopedics Dr [name] has followed and is doing well.  No left hip restrictions at this time.   --Fentanyl patch 12 mcg every 72 hours --Oxycodone 500 mg p.o. every 4 hours as needed moderate pain --Robaxin 5 mg p.o. 4 times daily   Chronic pain.    --Fentanyl patch 12 mcg every 72 hours  --Oxycodone 5 mg PO q4h PRN moderate pain --Robaxin 500 mg p.o. 4 times daily --Cymbalta 60 mg p.o.  daily --Amitriptyline 50 mg p.o. nightly  Anterior chest wall pain.   Abdominal x-ray was negative. Appears to be chest wall related.  Continue warm compress.  Continue muscle relaxant and oxycodone.    Right thigh mild cellulitis: resolved Improved, received a course of antibiotic in the past.  Likely from anasarca.     History of left intertrochanteric fracture Left hip hardware removed on 08/10/2021 prior to admission.  Physical therapy working to scoot and mobilize with wheelchair.  Will need at least 4 hours to be able to be considered for outpatient hemodialysis.  Currently has not been able to do much including lack of motivation.   End-stage renal disease on hemodialysis Anasarca --Nephrology following; appreciate assistance --Renal diet with 1.2 L fluid restriction --continue dialysis as per nephrology.    --Strict intake and output, daily weights --intermittent Lokelma for hyperkalemia, last Potassium  5.1 on 11/24 --Awaiting outpatient hemodialysis chair.   Hyponatremia Latest sodium of 127.  Managed with hemodialysis.   Hyperphosphatemia --continue sevelamer.   Anemia of chronic disease Latest hemoglobin  at 10.4.  Continue to monitor.   Diabetes mellitus type 2 with hyperglycemia Hemoglobin A1c 5.9 on 11/14/2021. --Semglee 10u [place] daily --Novolog 6u TIDAC --Sensitive SSI for coverage --CBGs qAC/HS  Essential hypertension --Coreg 3.125mg  PO BID --Hydralazine 50mg  PO TID --Lasix 80mg  PO daily  History of unspecified CVA Hyperlipidemia Continue Lipitor 40mg  PO daily   GERD --Continue Protonix 80mg  PO daily and Pepcid 20mg  PO daily   Tobacco use Counseled on need for complete cessation   Anxiety and depression --Continue amitriptyline 50mg  PO qHS and Cymbalta 60mg  PO daily.  Fungal rash left stump Patient reports was wearing her stump sock for several days without changing now with slightly irritated pruritic rash to left stump.  No concerning areas of  fluctuance/erythema. --Clotrimazole topically twice daily x 7 days   DVT prophylaxis: Place TED hose Start: 09/06/21 1507 SCDs Start: 08/31/21 1058    Code Status: Full Code Family Communication: No family present at bedside this morning  Disposition Plan:  Level of care: Med-Surg Status is: Inpatient Remains inpatient appropriate because: Needs HD seat set up before stable for discharge    Consultants:  Nephrology Vascular surgery Orthopedics Infectious disease  Procedures:  I&D's with tissue arrangement. Fistula branch ligation.   Antimicrobials:  Ceftriaxone 10/15 - 10/18 Doxycycline 10/14 - 10/14 Cefazolin 7/1 - 7/1, 7/20 - 7/20 Daptomycin 6/29 - 8/11   Subjective: Patient seen examined bedside, resting comfortably.  Currently receiving hemodialysis.  No other complaints or concerns this morning.  Denies headache, no dizziness, no chest pain, no palpitations, no shortness of breath, no abdominal pain, no fever/chills/night sweats, no nausea/vomiting/diarrhea, no focal weakness, no fatigue, no paresthesias.  No acute events overnight per nursing staff.  Objective: Vitals:   01/28/22 0900 01/28/22 0930 01/28/22 1000 01/28/22 1016  BP: (!) 151/58 (!) 158/54 (!) 154/57 (!) 154/58  Pulse: 63 63 64 66  Resp: 18 11 12 18   Temp:    98.1 F (36.7 C)  TempSrc:      SpO2: 98% 98% 96% 99%  Weight:      Height:        Intake/Output Summary (Last 24 hours) at 01/28/2022 1021 Last data filed at 01/28/2022 1016 Gross per 24 hour  Intake 120 ml  Output 4500 ml  Net -4380 ml   Filed Weights   01/21/22 1310 01/24/22 0902 01/24/22 1344  Weight: 93.3 kg 97.8 kg 93.4 kg    Examination:  Physical Exam: GEN: NAD, alert and oriented x 3, chronically ill appearance, appears older than stated age HEENT: NCAT, PERRL, EOMI, sclera clear, MMM PULM: CTAB w/o wheezes/crackles, normal respiratory effort CV: RRR w/o M/G/R GI: abd soft, NTND, NABS, no R/G/M MSK: Left BKA  noted, left upper extremity AV fistula noted, moves all extremities independently NEURO: CN II-XII intact, no focal deficits, sensation to light touch intact PSYCH: normal mood/affect Integumentary: Lacy rash noted to left thumb, otherwise no other concerning rashes/lesions/wounds noted on exposed skin surfaces.     Data Reviewed: I have personally reviewed following labs and imaging studies  CBC: Recent Labs  Lab 01/24/22 0940  WBC 7.6  HGB 10.4*  HCT 31.3*  MCV 89.2  PLT 626   Basic Metabolic Panel: Recent Labs  Lab 01/24/22 0324  NA 127*  K 5.1  CL 86*  CO2 25  GLUCOSE 172*  BUN 80*  CREATININE 6.58*  CALCIUM 9.4  PHOS 5.7*   GFR: Estimated Creatinine Clearance: 12.1 mL/min (A) (by C-G formula based on SCr of 6.58 mg/dL (H)). Liver Function Tests: Recent Labs  Lab 01/24/22 0324  ALBUMIN 3.3*   No results for input(s): "LIPASE", "AMYLASE" in the last 168 hours. No results for input(s): "AMMONIA" in the last 168 hours. Coagulation Profile: No results for input(s): "INR", "PROTIME" in the last 168 hours. Cardiac Enzymes: No results for input(s): "CKTOTAL", "CKMB", "CKMBINDEX", "TROPONINI" in the last 168 hours. BNP (last 3 results) No results for input(s): "PROBNP" in the last 8760 hours. HbA1C: No results for input(s): "HGBA1C" in the last 72 hours. CBG: Recent Labs  Lab 01/26/22 2305  01/27/22 0730 01/27/22 1131 01/27/22 1553 01/27/22 2053  GLUCAP 163* 143* 231* 90 139*   Lipid Profile: No results for input(s): "CHOL", "HDL", "LDLCALC", "TRIG", "CHOLHDL", "LDLDIRECT" in the last 72 hours. Thyroid Function Tests: No results for input(s): "TSH", "T4TOTAL", "FREET4", "T3FREE", "THYROIDAB" in the last 72 hours. Anemia Panel: No results for input(s): "VITAMINB12", "FOLATE", "FERRITIN", "TIBC", "IRON", "RETICCTPCT" in the last 72 hours. Sepsis Labs: No results for input(s): "PROCALCITON", "LATICACIDVEN" in the last 168 hours.  No results found for this  or any previous visit (from the past 240 hour(s)).       Radiology Studies: No results found.      Scheduled Meds:  (feeding supplement) PROSource Plus  30 mL Oral BID BM   acidophilus  1 capsule Oral Daily   alosetron  1 mg Oral BID   amitriptyline  50 mg Oral QHS   atorvastatin  40 mg Oral q1800   carvedilol  3.125 mg Oral BID   Chlorhexidine Gluconate Cloth  6 each Topical Q0600   clotrimazole   Topical BID   darbepoetin (ARANESP) injection - DIALYSIS  100 mcg Subcutaneous Q Fri-1800   docusate sodium  200 mg Oral BID   DULoxetine  60 mg Oral Daily   famotidine  20 mg Oral Daily   fentaNYL  1 patch Transdermal Q72H   furosemide  80 mg Oral Daily   heparin sodium (porcine)  2,000 Units Intravenous Once   hydrALAZINE  50 mg Oral TID   insulin aspart  0-9 Units Subcutaneous TID WC   insulin aspart  6 Units Subcutaneous TID WC   insulin glargine-yfgn  10 Units Subcutaneous QHS   methocarbamol  500 mg Oral QID   multivitamin  1 tablet Oral QHS   pantoprazole  80 mg Oral Daily   polyethylene glycol  17 g Oral Daily   sevelamer carbonate  2,400 mg Oral TID WC   sodium zirconium cyclosilicate  10 g Oral Once per day on Sun Tue Thu Sat   Continuous Infusions:  albumin human       LOS: 155 days    Time spent: 48 minutes spent on chart review, discussion with nursing staff, consultants, updating family and interview/physical exam; more than 50% of that time was spent in counseling and/or coordination of care.    Azyriah Nevins J [place] DO [facility] Available via Epic secure chat 7am-7pm After these hours, please refer to coverage provider listed on [email or web address] 01/28/2022, 10:21 AM

## 2022-01-28 NOTE — Plan of Care (Signed)
  Problem: Activity: Goal: Risk for activity intolerance will decrease Outcome: Progressing   Problem: Pain Managment: Goal: General experience of comfort will improve Outcome: Progressing   

## 2022-01-28 NOTE — Progress Notes (Signed)
[facility] Progress Note   Subjective:   Patient seen and examined in dialysis.  Tolerating treatment well so far.  No specific complaints.   Objective Vitals:   01/28/22 0830 01/28/22 0900 01/28/22 0930 01/28/22 1000  BP: (!) 148/60 (!) 151/58 (!) 158/54 (!) 154/57  Pulse: 65 63 63 64  Resp: 16 18 11 12   Temp:      TempSrc:      SpO2: 98% 98% 98% 96%  Weight:      Height:       Physical Exam General:well appearing female in NAD Heart:RRR, no mrg Lungs:CTAB, nml WOB on RA Abdomen:soft, NTND Extremities:+LE edema, L BKA Dialysis Access: LU AVF in use   Filed Weights   01/21/22 1310 01/24/22 0902 01/24/22 1344  Weight: 93.3 kg 97.8 kg 93.4 kg    Intake/Output Summary (Last 24 hours) at 01/28/2022 1004 Last data filed at 01/27/2022 2200 Gross per 24 hour  Intake 120 ml  Output 0 ml  Net 120 ml    Additional Objective Labs: Basic Metabolic Panel: Recent Labs  Lab 01/24/22 0324  NA 127*  K 5.1  CL 86*  CO2 25  GLUCOSE 172*  BUN 80*  CREATININE 6.58*  CALCIUM 9.4  PHOS 5.7*   Liver Function Tests: Recent Labs  Lab 01/24/22 0324  ALBUMIN 3.3*    CBC: Recent Labs  Lab 01/24/22 0940  WBC 7.6  HGB 10.4*  HCT 31.3*  MCV 89.2  PLT 235   CBG: Recent Labs  Lab 01/26/22 2305 01/27/22 0730 01/27/22 1131 01/27/22 1553 01/27/22 2053  GLUCAP 163* 143* 231* 90 139*    Medications:  albumin human      (feeding supplement) PROSource Plus  30 mL Oral BID BM   acidophilus  1 capsule Oral Daily   alosetron  1 mg Oral BID   amitriptyline  50 mg Oral QHS   atorvastatin  40 mg Oral q1800   carvedilol  3.125 mg Oral BID   Chlorhexidine Gluconate Cloth  6 each Topical Q0600   clotrimazole   Topical BID   darbepoetin (ARANESP) injection - DIALYSIS  100 mcg Subcutaneous Q Fri-1800   docusate sodium  200 mg Oral BID   DULoxetine  60 mg Oral Daily   famotidine  20 mg Oral Daily   fentaNYL  1 patch Transdermal Q72H   furosemide  80 mg  Oral Daily   heparin sodium (porcine)  2,000 Units Intravenous Once   hydrALAZINE  50 mg Oral TID   insulin aspart  0-9 Units Subcutaneous TID WC   insulin aspart  6 Units Subcutaneous TID WC   insulin glargine-yfgn  10 Units Subcutaneous QHS   methocarbamol  500 mg Oral QID   multivitamin  1 tablet Oral QHS   pantoprazole  80 mg Oral Daily   polyethylene glycol  17 g Oral Daily   sevelamer carbonate  2,400 mg Oral TID WC   sodium zirconium cyclosilicate  10 g Oral Once per day on Sun Tue Thu Sat    Dialysis Orders: MWF at [place], 400/500, EDW 90kg, 2K/2Ca, LUE AVF - heparin 4000 unit IV bolus, no ESA   Assessment/Plan: 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 Completed 6 wk course of IV Daptomycin on 10/12/21. Per Dr. Jess Barters note 12/28/2021 "NO RESTRICTIONS L HIP".  Chronic L hip/residual limb pain: She does have fractures of L femoral neck (where hardware was removed) as well as distal femur  Fx. Pain control per primary.  ESRD: Usual MWF schedule. Clots HD system without heparin, 4000 unit bolus and 2000 unit mid-run bolus. Off schedule this b/c she did not want to run overnight - Keep on TTS schedule this week.  Will resume regular schedule next week.  Hyperkalemia: Stable on scheduled lokelma 4 days a week.  HTN/volume: BP meds have been tapered down - currently on Coreg 3.125mg  BID only. Large fluids gains between HD which has been discussed extensively, Ongoing edema, continue max UF as tolerated. Anemia of ESRD: Hgb 9-10s stable. Last tsat 54%. Continue Aranesp 161mcg weekly.  Secondary HPTH: CorrCa slightly high, not on VDRA. Last phos trending up, continue Renvela as binder. Nutrition: Alb low, continue protein supplements.  Renal diet w/fluid restrictions.   AVF dysfunction (resolved): S/p branch ligation 09/19/2021  Dr. Virl Cagey. AVF working well now. DM2: Insulin per primary Dispo: SNF previously recommended, but she  declined. Not home HD candidate.  Dialyzed in recliner on 9/4 but signed off early due to pain, has declined to come in a recliner since then.  Strongly discussed on the importance of increasing her mobility as much as possible while she is in her room in addition to following her ongoing fluid restriction. Also reinforced the importance of being in the chair for HD treatments to improve strength and mobility. Does not like to do PT after HD, will try to do HD in the afternoons so she can do PT in the AM. Per Renal Navigator notes there are no clinics that will accept patient unless she will have HD in chair. Will continue to encourage use of chair for HD.  Jen Mow, PA-C [facility] 01/28/2022,10:04 AM  LOS: 155 days

## 2022-01-28 NOTE — Procedures (Signed)
HD Note  Received patient in bed to unit.  Alert and oriented.  Informed consent signed and in chart.    Patient tolerated well.  Transported back to the room  Alert, without acute distress.  Hand-off given to patient's nurse.   Access used: Left AVF Access issues: None  Total UF removed: 4500 ml Medication(s) given: Tylenol     [name]

## 2022-01-28 NOTE — Progress Notes (Signed)
Pt returned to room 5M05 via bed after dialysis. Received report from [place], Therapist, sports.

## 2022-01-28 NOTE — Progress Notes (Signed)
PT Cancellation Note  Patient Details Name: [name] MRN: [id number] DOB: 1965/09/29   Cancelled Treatment:    Reason Eval/Treat Not Completed: Patient at procedure or test/unavailable  Currently in HD;  Will follow up later today as time allows;  Otherwise, will follow up for PT tomorrow;   Thank you,  Roney Marion, [place] [facility] 01/28/2022, 8:42 AM

## 2022-01-29 DIAGNOSIS — N186 End stage renal disease: Secondary | ICD-10-CM | POA: Diagnosis not present

## 2022-01-29 DIAGNOSIS — M86252 Subacute osteomyelitis, left femur: Secondary | ICD-10-CM | POA: Diagnosis not present

## 2022-01-29 DIAGNOSIS — Z8673 Personal history of transient ischemic attack (TIA), and cerebral infarction without residual deficits: Secondary | ICD-10-CM | POA: Diagnosis not present

## 2022-01-29 DIAGNOSIS — F419 Anxiety disorder, unspecified: Secondary | ICD-10-CM | POA: Diagnosis not present

## 2022-01-29 LAB — GLUCOSE, CAPILLARY
Glucose-Capillary: 185 mg/dL — ABNORMAL HIGH (ref 70–99)
Glucose-Capillary: 206 mg/dL — ABNORMAL HIGH (ref 70–99)
Glucose-Capillary: 96 mg/dL (ref 70–99)
Glucose-Capillary: 97 mg/dL (ref 70–99)

## 2022-01-29 MED ORDER — PENTAFLUOROPROP-TETRAFLUOROETH EX AERO: 1.0000 | INHALATION_SPRAY | CUTANEOUS | Status: DC | PRN

## 2022-01-29 MED ORDER — CHLORHEXIDINE GLUCONATE CLOTH 2 % EX PADS
6.0000 | MEDICATED_PAD | Freq: Every day | CUTANEOUS | Status: DC
  Administered 2022-01-30: 6 via TOPICAL

## 2022-01-29 MED ORDER — HEPARIN SODIUM (PORCINE) 1000 UNIT/ML DIALYSIS: 4000.0000 [IU] | INTRAMUSCULAR | Status: DC | PRN

## 2022-01-29 NOTE — Progress Notes (Signed)
PROGRESS NOTE    [name]  VPX:[id number] DOB: 12-09-1965 DOA: 08/26/2021 PCP: [name], MD    Brief Narrative:   Patient is 56 year old female with history of hypertension, hyperlipidemia, diabetes mellitus type 2, end-stage renal disease on hemodialysis, status post left BKA for chronic calcaneus osteomyelitis on 01/12/2020, bipolar disorder, left distal femoral fracture after a fall on 07/19/2021 deemed  nonoperative, admission from 08/03/2021-08/23/2021 for left femoral osteomyelitis requiring partial resection of femur and rearrangements of bony fragments with subsequent antibiotic treatment presented to the hospital with left hip bleeding and was found to have subacute osteomyelitis and abscess of left femur.  Orthopedic/ID were consulted and patient was started on broad-spectrum antibiotics.  Underwent multiple I&D's by Dr. [name] along with local tissue rearrangement.  Patient was also seen by vascular surgery who did  fistula branch ligation.  She has completed 6-week course of daptomycin on 10/09/2021.  She has been unable to complete hemodialysis in chair and outpatient hemodialysis unit has been hard to find.  She is otherwise medically stable for discharge.   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  Active Problems:   ESRD (end stage renal disease) on dialysis ([place])   Type 2 diabetes mellitus with hyperlipidemia (HCC)   History of CVA (cerebrovascular accident)   Essential hypertension   Anxiety and depression   GASTROESOPHAGEAL REFLUX, NO ESOPHAGITIS   TOBACCO DEPENDENCE   Hyponatremia   Obesity (BMI 30.0-34.9)   Unilateral amputation of left foot (HCC)   Wound infection   History of CVA (cerebrovascular accident) without residual deficits   Hyperkalemia   Wound dehiscence, surgical, sequela   Subacute osteomyelitis of left femur with abscess Status post surgical intervention by Dr. [name] with multiple left hip  debridements.  Completed a 6-week course of daptomycin on 10/09/2021.  Continue muscle relaxant and oxycodone.  Re-imaging on 12/02/2021 of left knee showed healing fracture with callus.  Orthopedics Dr [name] has followed and is doing well.  No left hip restrictions at this time.  On fentanyl patch, oxycodone and Robaxin.  Chronic pain.   On fentanyl patch oxycodone and Robaxin.  Continue amitriptyline.  Anterior chest wall pain.  Musculoskeletal.  Right thigh mild cellulitis Resolved   History of left intertrochanteric fracture Left hip hardware removed on 08/10/2021 prior to admission.  Physical therapy working to scoot and mobilize with wheelchair.  Will need at least 4 hours to be able to be considered for outpatient hemodialysis.  Currently has not been able to do much including lack of motivation.  End-stage renal disease on hemodialysis Anasarca Nephrology on board for hemodialysis needs.  Awaiting for outpatient hemodialysis.  Hyponatremia Will repeat BMP in AM.   Hyperphosphatemia continue sevelamer.  Anemia of chronic disease Latest hemoglobin  at 10.4.  Continue to monitor.  Diabetes mellitus type 2 with hyperglycemia Last hemoglobin A1c of 5.9 on 11/14/2021.  Continue mealtime sliding scale and long-acting insulin.  Closely monitor.   Essential hypertension Continue Coreg, hydralazine, Lasix.  Blood pressure seems to be stable.   History of unspecified CVA Hyperlipidemia Continue Lipitor.   GERD -Continue Protonix and Pepcid   Tobacco use Stable at this time.   Anxiety and depression Continue amitriptyline and Cymbalta.  Fungal rash left stump, on clotrimazole.    DVT prophylaxis: Place TED hose Start: 09/06/21 1507 SCDs Start: 08/31/21 1058   Code Status:     Code Status: Full Code  Disposition: Uncertain at this time  Status is: Inpatient  Remains inpatient appropriate because: Pending disposition plan pending tolerance with the dialysis chair.    Family Communication:  None at bedside.  Consultants:  Nephrology Vascular surgery Orthopedics Infectious disease  Procedures:  Multiple I&D's with tissue arrangement. Fistula branch ligation. Hemodialysis  Antimicrobials:  Ceftriaxone 10/15 - 10/18 Doxycycline 10/14 - 10/14 Cefazolin 7/1 - 7/1, 7/20 - 7/20 Daptomycin 6/29 - 8/11  Subjective: Today, patient was seen and examined at bedside.  States that she is trying her best to do more activity.  Denies any nausea vomiting fever chills  Objective: Vitals:   01/28/22 1102 01/28/22 1852 01/28/22 2146 01/29/22 0558  BP: (!) 150/55 (!) 148/62 (!) 147/57 (!) 143/57  Pulse: 64 66 (!) 59 62  Resp:  17 18 18   Temp: 98.1 F (36.7 C) 98.3 F (36.8 C) 98.1 F (36.7 C) (!) 97.4 F (36.3 C)  TempSrc: Oral Oral Oral Oral  SpO2: 96% 98% 98% 99%  Weight:      Height:        Intake/Output Summary (Last 24 hours) at 01/29/2022 0756 Last data filed at 01/29/2022 7106 Gross per 24 hour  Intake 480 ml  Output 5250 ml  Net -4770 ml    Filed Weights   01/21/22 1310 01/24/22 0902 01/24/22 1344  Weight: 93.3 kg 97.8 kg 93.4 kg    Physical Examination: Body mass index is 29.54 kg/m.   General: Obese built, not in obvious distress, chronically ill, appears older than stated HENT:   No scleral pallor or icterus noted. Oral mucosa is moist.  Chest:  Clear breath sounds.  Diminished breath sounds bilaterally. No crackles or wheezes.  CVS: S1 &S2 heard. No murmur.  Regular rate and rhythm. Abdomen: Soft, nontender, nondistended.  Bowel sounds are heard.   Extremities: Left below-knee amputation, left upper extremity AV fistula, Psych: Alert, awake and oriented, normal mood CNS:  No cranial nerve deficits.  Moves extremities. Skin: Warm and dry.     Data Reviewed:   CBC: Recent Labs  Lab 01/24/22 0940  WBC 7.6  HGB 10.4*  HCT 31.3*  MCV 89.2  PLT 235     Basic Metabolic Panel: Recent Labs  Lab 01/24/22 0324  NA  127*  K 5.1  CL 86*  CO2 25  GLUCOSE 172*  BUN 80*  CREATININE 6.58*  CALCIUM 9.4  PHOS 5.7*     Liver Function Tests: Recent Labs  Lab 01/24/22 0324  ALBUMIN 3.3*      Radiology Studies: No results found.    LOS: 156 days    Flora Lipps, MD [facility] Available via Epic secure chat 7am-7pm After these hours, please refer to coverage provider listed on [email or web address] 01/29/2022, 7:56 AM

## 2022-01-29 NOTE — Inpatient Diabetes Management (Signed)
Inpatient Diabetes Program Recommendations  AACE/ADA: New Consensus Statement on Inpatient Glycemic Control (2015)  Target Ranges:  Prepandial:   less than 140 mg/dL      Peak postprandial:   less than 180 mg/dL (1-2 hours)      Critically ill patients:  140 - 180 mg/dL   Lab Results  Component Value Date   GLUCAP 185 (H) 01/29/2022   HGBA1C 5.9 (H) 11/14/2021    Review of Glycemic Control  Latest Reference Range & Units 01/28/22 10:58 01/28/22 16:25 01/28/22 21:45 01/29/22 08:14  Glucose-Capillary 70 - 99 mg/dL 130 (H) 187 (H) 208 (H) 185 (H)   Diabetes history: DM 2 Outpatient Diabetes medications:  Novolog 10 units tid, Lantus 10 units qhs Current orders for Inpatient glycemic control:  Novolog 6 units tid Semglee 10 units qhs Novolog 0-9 units tid  Inpatient Diabetes Program Recommendations:    -  Increase Novolog meal coverage to 8 units tid if eating >50% and glucose is at least 80 mg/dl.  Thanks,  Tama Headings RN, MSN, BC-ADM Inpatient Diabetes Coordinator Team Pager [phone] (8a-5p)

## 2022-01-29 NOTE — Progress Notes (Signed)
[facility] Progress Note   Subjective:   Patient seen and examined at bedside.  Upset she is not on MWF schedule this week because TTS schedule will interfere with planned therapy and her daughter visiting on Sat.  Denies CP, abdominal pain and SOB.  Continues to struggle with LE edema.  Feeling bloated.   Objective Vitals:   01/28/22 1852 01/28/22 2146 01/29/22 0558 01/29/22 0953  BP: (!) 148/62 (!) 147/57 (!) 143/57 (!) 169/62  Pulse: 66 (!) 59 62 64  Resp: 17 18 18 19   Temp: 98.3 F (36.8 C) 98.1 F (36.7 C) (!) 97.4 F (36.3 C) 98.1 F (36.7 C)  TempSrc: Oral Oral Oral Oral  SpO2: 98% 98% 99% 97%  Weight:      Height:       Physical Exam General:well appearing female in NAD Heart:RRR, no mrg Lungs:CTAB Abdomen:soft, NTND, +flank edema Extremities:1+ edema b/l, L BKA Dialysis Access: LU AVF +b/t   Filed Weights   01/21/22 1310 01/24/22 0902 01/24/22 1344  Weight: 93.3 kg 97.8 kg 93.4 kg    Intake/Output Summary (Last 24 hours) at 01/29/2022 1335 Last data filed at 01/29/2022 1026 Gross per 24 hour  Intake 240 ml  Output 550 ml  Net -310 ml    Additional Objective Labs: Basic Metabolic Panel: Recent Labs  Lab 01/24/22 0324  NA 127*  K 5.1  CL 86*  CO2 25  GLUCOSE 172*  BUN 80*  CREATININE 6.58*  CALCIUM 9.4  PHOS 5.7*   Liver Function Tests: Recent Labs  Lab 01/24/22 0324  ALBUMIN 3.3*   CBC: Recent Labs  Lab 01/24/22 0940  WBC 7.6  HGB 10.4*  HCT 31.3*  MCV 89.2  PLT 235   CBG: Recent Labs  Lab 01/28/22 1058 01/28/22 1625 01/28/22 2145 01/29/22 0814 01/29/22 1121  GLUCAP 130* 187* 208* 185* 206*   Medications:  albumin human      (feeding supplement) PROSource Plus  30 mL Oral BID BM   acidophilus  1 capsule Oral Daily   alosetron  1 mg Oral BID   amitriptyline  50 mg Oral QHS   atorvastatin  40 mg Oral q1800   carvedilol  3.125 mg Oral BID   Chlorhexidine Gluconate Cloth  6 each Topical Q0600   [START  ON 01/30/2022] Chlorhexidine Gluconate Cloth  6 each Topical Q0600   clotrimazole   Topical BID   darbepoetin (ARANESP) injection - DIALYSIS  100 mcg Subcutaneous Q Fri-1800   docusate sodium  200 mg Oral BID   DULoxetine  60 mg Oral Daily   famotidine  20 mg Oral Daily   fentaNYL  1 patch Transdermal Q72H   furosemide  80 mg Oral Daily   heparin sodium (porcine)  2,000 Units Intravenous Once   hydrALAZINE  50 mg Oral TID   insulin aspart  0-9 Units Subcutaneous TID WC   insulin aspart  6 Units Subcutaneous TID WC   insulin glargine-yfgn  10 Units Subcutaneous QHS   methocarbamol  500 mg Oral QID   multivitamin  1 tablet Oral QHS   pantoprazole  80 mg Oral Daily   polyethylene glycol  17 g Oral Daily   sevelamer carbonate  2,400 mg Oral TID WC   sodium zirconium cyclosilicate  10 g Oral Once per day on Sun Tue Thu Sat    Dialysis Orders: MWF at [place], 400/500, EDW 90kg, 2K/2Ca, LUE AVF - heparin 4000 unit IV bolus, no ESA  Assessment/Plan: 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 Completed 6 wk course of IV Daptomycin on 10/12/21. Per Dr. Jess Barters note 12/28/2021 "NO RESTRICTIONS L HIP".  Chronic L hip/residual limb pain: She does have fractures of L femoral neck (where hardware was removed) as well as distal femur Fx. Pain control per primary.  ESRD: Usual MWF schedule. Clots HD system without heparin, 4000 unit bolus and 2000 unit mid-run bolus. Off schedule this b/c she did not want to run overnight Monday.  Opening this afternoon in HD schedule so will resume MWF schedule today.  Hyperkalemia: Stable on scheduled lokelma 4 days a week.  HTN/volume: BP meds have been tapered down - currently on Coreg 3.125mg  BID only. Large fluids gains between HD which has been discussed extensively, Ongoing edema, continue max UF as tolerated. Anemia of ESRD: Hgb 9-10s stable. Last tsat 54%. Continue Aranesp 114mcg weekly.  Secondary HPTH:  CorrCa slightly high, not on VDRA. Last phos trending up, continue Renvela as binder. Nutrition: Alb low, continue protein supplements.  Renal diet w/fluid restrictions.   AVF dysfunction (resolved): S/p branch ligation 09/19/2021  Dr. Virl Cagey. AVF working well now. DM2: Insulin per primary Dispo: SNF previously recommended, but she declined. Not home HD candidate.  Dialyzed in recliner on 9/4 but signed off early due to pain, has declined to come in a recliner since then.  Strongly discussed on the importance of increasing her mobility as much as possible while she is in her room in addition to following her ongoing fluid restriction. Also reinforced the importance of being in the chair for HD treatments to improve strength and mobility. Does not like to do PT after HD, will try to do HD in the afternoons so she can do PT in the AM. Per Renal Navigator notes there are no clinics that will accept patient unless she will have HD in chair. Will continue to encourage use of chair for HD  Jen Mow, PA-C [place] 01/29/2022,1:35 PM  LOS: 156 days

## 2022-01-29 NOTE — Progress Notes (Signed)
Mobility Specialist Progress Note:   01/29/22 1500  Mobility  Activity  (stretches + exercises)  Level of Assistance Modified independent, requires aide device or extra time  Assistive Device None  Range of Motion/Exercises Active;Passive;Right leg;Left leg  Activity Response Tolerated well  Mobility Referral Yes  $Mobility charge 1 Mobility   Pt agreeable to mobility session. Session focused on bed level HEP + stretches. Tolerated well, left with all needs met.   Nelta Numbers Mobility Specialist Please contact via SecureChat or  Rehab office at [phone]

## 2022-01-29 NOTE — Progress Notes (Deleted)
[name] Progress Note   Subjective:   Patient seen and examined at bedside.  Upset she is not on MWF schedule this week because TTS schedule will interfere with planned therapy and her daughter visiting on Sat.  Denies CP, abdominal pain and SOB.  Continues to struggle with LE edema.  Feeling bloated.   Objective Vitals:   01/28/22 1852 01/28/22 2146 01/29/22 0558 01/29/22 0953  BP: (!) 148/62 (!) 147/57 (!) 143/57 (!) 169/62  Pulse: 66 (!) 59 62 64  Resp: 17 18 18 19   Temp: 98.3 F (36.8 C) 98.1 F (36.7 C) (!) 97.4 F (36.3 C) 98.1 F (36.7 C)  TempSrc: Oral Oral Oral Oral  SpO2: 98% 98% 99% 97%  Weight:      Height:       Physical Exam General:well appearing female in NAD Heart:RRR, no mrg Lungs:CTAB Abdomen:soft, NTND, +flank edema Extremities:1+ edema b/l, L BKA Dialysis Access: LU AVF +b/t   Filed Weights   01/21/22 1310 01/24/22 0902 01/24/22 1344  Weight: 93.3 kg 97.8 kg 93.4 kg    Intake/Output Summary (Last 24 hours) at 01/29/2022 1335 Last data filed at 01/29/2022 1026 Gross per 24 hour  Intake 240 ml  Output 550 ml  Net -310 ml    Additional Objective Labs: Basic Metabolic Panel: Recent Labs  Lab 01/24/22 0324  NA 127*  K 5.1  CL 86*  CO2 25  GLUCOSE 172*  BUN 80*  CREATININE 6.58*  CALCIUM 9.4  PHOS 5.7*   Liver Function Tests: Recent Labs  Lab 01/24/22 0324  ALBUMIN 3.3*   CBC: Recent Labs  Lab 01/24/22 0940  WBC 7.6  HGB 10.4*  HCT 31.3*  MCV 89.2  PLT 235   CBG: Recent Labs  Lab 01/28/22 1058 01/28/22 1625 01/28/22 2145 01/29/22 0814 01/29/22 1121  GLUCAP 130* 187* 208* 185* 206*   Medications:  albumin human      (feeding supplement) PROSource Plus  30 mL Oral BID BM   acidophilus  1 capsule Oral Daily   alosetron  1 mg Oral BID   amitriptyline  50 mg Oral QHS   atorvastatin  40 mg Oral q1800   carvedilol  3.125 mg Oral BID   Chlorhexidine Gluconate Cloth  6 each Topical Q0600   [START  ON 01/30/2022] Chlorhexidine Gluconate Cloth  6 each Topical Q0600   clotrimazole   Topical BID   darbepoetin (ARANESP) injection - DIALYSIS  100 mcg Subcutaneous Q Fri-1800   docusate sodium  200 mg Oral BID   DULoxetine  60 mg Oral Daily   famotidine  20 mg Oral Daily   fentaNYL  1 patch Transdermal Q72H   furosemide  80 mg Oral Daily   heparin sodium (porcine)  2,000 Units Intravenous Once   hydrALAZINE  50 mg Oral TID   insulin aspart  0-9 Units Subcutaneous TID WC   insulin aspart  6 Units Subcutaneous TID WC   insulin glargine-yfgn  10 Units Subcutaneous QHS   methocarbamol  500 mg Oral QID   multivitamin  1 tablet Oral QHS   pantoprazole  80 mg Oral Daily   polyethylene glycol  17 g Oral Daily   sevelamer carbonate  2,400 mg Oral TID WC   sodium zirconium cyclosilicate  10 g Oral Once per day on Sun Tue Thu Sat    Dialysis Orders: MWF at [place], 400/500, EDW 90kg, 2K/2Ca, LUE AVF - heparin 4000 unit IV bolus, no ESA  Assessment/Plan: 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 Completed 6 wk course of IV Daptomycin on 10/12/21. Per Dr. Jess Barters note 12/28/2021 "NO RESTRICTIONS L HIP".  Chronic L hip/residual limb pain: She does have fractures of L femoral neck (where hardware was removed) as well as distal femur Fx. Pain control per primary.  ESRD: Usual MWF schedule. Clots HD system without heparin, 4000 unit bolus and 2000 unit mid-run bolus. Off schedule this b/c she did not want to run overnight Monday.  Opening this afternoon in HD schedule so will resume MWF schedule today.  Hyperkalemia: Stable on scheduled lokelma 4 days a week.  HTN/volume: BP meds have been tapered down - currently on Coreg 3.125mg  BID only. Large fluids gains between HD which has been discussed extensively, Ongoing edema, continue max UF as tolerated. Anemia of ESRD: Hgb 9-10s stable. Last tsat 54%. Continue Aranesp 116mcg weekly.  Secondary HPTH:  CorrCa slightly high, not on VDRA. Last phos trending up, continue Renvela as binder. Nutrition: Alb low, continue protein supplements.  Renal diet w/fluid restrictions.   AVF dysfunction (resolved): S/p branch ligation 09/19/2021  Dr. Virl Cagey. AVF working well now. DM2: Insulin per primary Dispo: SNF previously recommended, but she declined. Not home HD candidate.  Dialyzed in recliner on 9/4 but signed off early due to pain, has declined to come in a recliner since then.  Strongly discussed on the importance of increasing her mobility as much as possible while she is in her room in addition to following her ongoing fluid restriction. Also reinforced the importance of being in the chair for HD treatments to improve strength and mobility. Does not like to do PT after HD, will try to do HD in the afternoons so she can do PT in the AM. Per Renal Navigator notes there are no clinics that will accept patient unless she will have HD in chair. Will continue to encourage use of chair for HD  Jen Mow, PA-C [place] 01/29/2022,1:35 PM  LOS: 156 days

## 2022-01-30 DIAGNOSIS — M86252 Subacute osteomyelitis, left femur: Secondary | ICD-10-CM | POA: Diagnosis not present

## 2022-01-30 DIAGNOSIS — Z8673 Personal history of transient ischemic attack (TIA), and cerebral infarction without residual deficits: Secondary | ICD-10-CM | POA: Diagnosis not present

## 2022-01-30 DIAGNOSIS — N186 End stage renal disease: Secondary | ICD-10-CM | POA: Diagnosis not present

## 2022-01-30 DIAGNOSIS — T148XXA Other injury of unspecified body region, initial encounter: Secondary | ICD-10-CM | POA: Diagnosis not present

## 2022-01-30 LAB — CBC
HCT: 35.4 % — ABNORMAL LOW (ref 36.0–46.0)
Hemoglobin: 11.4 g/dL — ABNORMAL LOW (ref 12.0–15.0)
MCH: 29.6 pg (ref 26.0–34.0)
MCHC: 32.2 g/dL (ref 30.0–36.0)
MCV: 91.9 fL (ref 80.0–100.0)
Platelets: 230 10*3/uL (ref 150–400)
RBC: 3.85 MIL/uL — ABNORMAL LOW (ref 3.87–5.11)
RDW: 15.9 % — ABNORMAL HIGH (ref 11.5–15.5)
WBC: 5.7 10*3/uL (ref 4.0–10.5)
nRBC: 0 % (ref 0.0–0.2)

## 2022-01-30 LAB — GLUCOSE, CAPILLARY
Glucose-Capillary: 133 mg/dL — ABNORMAL HIGH (ref 70–99)
Glucose-Capillary: 146 mg/dL — ABNORMAL HIGH (ref 70–99)
Glucose-Capillary: 161 mg/dL — ABNORMAL HIGH (ref 70–99)
Glucose-Capillary: 203 mg/dL — ABNORMAL HIGH (ref 70–99)

## 2022-01-30 LAB — BASIC METABOLIC PANEL
Anion gap: 15 (ref 5–15)
BUN: 89 mg/dL — ABNORMAL HIGH (ref 6–20)
CO2: 24 mmol/L (ref 22–32)
Calcium: 9 mg/dL (ref 8.9–10.3)
Chloride: 90 mmol/L — ABNORMAL LOW (ref 98–111)
Creatinine, Ser: 6.39 mg/dL — ABNORMAL HIGH (ref 0.44–1.00)
GFR, Estimated: 7 mL/min — ABNORMAL LOW (ref >60)
Glucose, Bld: 179 mg/dL — ABNORMAL HIGH (ref 70–99)
Potassium: 5.6 mmol/L — ABNORMAL HIGH (ref 3.5–5.1)
Sodium: 129 mmol/L — ABNORMAL LOW (ref 135–145)

## 2022-01-30 MED ORDER — FENTANYL 12 MCG/HR TD PT72
1.0000 | MEDICATED_PATCH | TRANSDERMAL | Status: DC
  Administered 2022-02-02 – 2022-03-16 (×15): 1 via TRANSDERMAL
  Filled 2022-01-30 (×17): qty 1

## 2022-01-30 MED ORDER — HEPARIN SODIUM (PORCINE) 1000 UNIT/ML IJ SOLN
INTRAMUSCULAR | Status: AC
  Filled 2022-01-30: qty 4

## 2022-01-30 NOTE — Progress Notes (Signed)
MD/PA had missed putting pt's orders in and this wasn't realized until about 1325. Called MD to make him aware pt had no orders and he stated he would call PA to put them in. I and the unit secretary went to pick up the pt for HD tx as transport was very behind. Pt had even already called up to the unit asking when she was going to go to HD today and she was ready to go. When we arrived to her room at approximately 1350, pt refused to go w/ [place] stating she was refusing HD today as it was too late and she felt nauseated and she would go to HD tomorrow.Pt educated of the importance of keeping up w/ her dialysis txs and not skipping them. Pt still refused to come today. PA made aware of pt's refusal. Orders modified to change tx date. Pt will be first rounds tomorrow.

## 2022-01-30 NOTE — Progress Notes (Signed)
Physical Therapy Treatment Patient Details Name: [name] MRN: [id number] DOB: [birth year] Today's Date: 01/30/2022   History of Present Illness 56 y.o. female presented to ED 6/26 from dialysis with increased bloody drainage from L hip wound. 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 s/p hardware removal of left hip with partial resection of tuft tissue and femure that were nonviable on 08/10/21 Discharged home 6/23. underwent L hip debridement and wound vac placement 6/28; +Left intertrochanteric non-union,  Fracture of left distal femur  PMH: hypertension, hyperlipidemia, ESRD on HD MWF, history of left BKA in 2021, depression/anxiety, stroke, tobacco use, T2DM,  insomnia, chronic pain syndrome,    PT Comments    Remains motivated and eager to work with therapy today. Progressed with seated balance, weight bearing through RLE on EOB, and weight shifting techniques. Carried over with actual lateral sliding board transfer, min assist to place board securely and remove - min guard during transfer with W/c block. Reviewed LLE positioning, precautions, and helpful exercises due to obvious shortening of soft tissues occurring. Patient will continue to benefit from skilled physical therapy services to further improve independence with functional mobility.    Recommendations for follow up therapy are one component of a multi-disciplinary discharge planning process, led by the attending physician.  Recommendations may be updated based on patient status, additional functional criteria and insurance authorization.  Follow Up Recommendations  Skilled nursing-short term rehab (<3 hours/day) Can patient physically be transported by private vehicle: No   Assistance Recommended at Discharge Intermittent Supervision/Assistance  Patient can return home with the following A lot of help with bathing/dressing/bathroom;Assistance with cooking/housework;Assist for  transportation;Two people to help with walking and/or transfers;Help with stairs or ramp for entrance   Equipment Recommendations  Other (comment) (Sliding board, drop-arm BSC; needs a wheelchair cushion; would need medical [name] transport for HD)    Recommendations for Other Services OT consult;Speech consult     Precautions / Restrictions Precautions Precautions: Fall;Other (comment) Precaution Comments: L BKA (baseline) Restrictions Weight Bearing Restrictions: No LLE Weight Bearing: Weight bearing as tolerated Other Position/Activity Restrictions: L distal femur fx; per Dr [name] 8/10 pt can full WB; Wear liner during the day and shrinker overnight     Mobility  Bed Mobility Overal bed mobility: Needs Assistance Bed Mobility: Supine to Sit, Sit to Supine     Supine to sit: Min guard Sit to supine: Min guard   General bed mobility comments: Min guard for bed mobility, effortful to rise to EOB with HOB elevated using rail as able with VC for technique. Brings LEs to EOB without assist. Some trunk instability initially. Good control returning back to bed with guard for safety only.    Transfers Overall transfer level: Needs assistance Equipment used: Sliding board, Rolling walker (2 wheels) Transfers: Bed to chair/wheelchair/BSC, Sit to/from Stand Sit to Stand: From elevated surface, Total assist (with RW)          Lateral/Scoot Transfers: Min assist, With slide board General transfer comment: Encouraged to try WB through RLE today. Bed elevated, assist to lift buttocks from bed (almost.) Performed 5 times with static hold and max effort from patient. Practiced lateral scoot transfer with sliding board. Extensive education - performed OOB to w/c Rt side (per pt request,) and into bed towards Lt side. Greatly improved control scooting Lt and encouraged to practice this direction repeatedly to maximize technique and confidence. Min assist was provided each direction only to place  sliding board. And guard  during transfer.    Ambulation/Gait                   Stairs             Wheelchair Mobility    Modified Rankin (Stroke Patients Only)       Balance Overall balance assessment: Needs assistance Sitting-balance support: Feet supported Sitting balance-Leahy Scale: Fair (approaching Good) Sitting balance - Comments: Practiced extensive seated balance activities weight-shifting and scooting on bed - focusing on improved ability to self place sliding board for greater independence.   Standing balance support: Bilateral upper extremity supported, During functional activity, Reliant on assistive device for balance Standing balance-Leahy Scale: Zero Standing balance comment: Does not clear buttock from bed                            Cognition Arousal/Alertness: Awake/alert Behavior During Therapy: WFL for tasks assessed/performed Overall Cognitive Status: Impaired/Different from baseline Area of Impairment: Safety/judgement, Awareness                     Memory: Decreased short-term memory   Safety/Judgement: Decreased awareness of deficits, Decreased awareness of safety     General Comments: Refusing to wear liner during the day. Only agreeable to shrinker sock        Exercises Other Exercises Other Exercises: Reviewed Lt hip internal rotation exercise, adding knee extension with towel and press, hip adduction periodically to perform throughout the day.    General Comments General comments (skin integrity, edema, etc.): Extensive education for positioning, exercises, shrinker sock and liner      Pertinent Vitals/Pain Pain Assessment Pain Assessment: 0-10 Pain Score: 6  Pain Location: back pain Pain Descriptors / Indicators: Discomfort, Guarding, Dull Pain Intervention(s): Monitored during session, Repositioned, Limited activity within patient's tolerance    Home Living                           Prior Function            PT Goals (current goals can now be found in the care plan section) Acute Rehab PT Goals Patient Stated Goal: Stated she wants to be able to be home and walk by Christmas PT Goal Formulation: With patient Time For Goal Achievement: 02/13/22 Potential to Achieve Goals: Fair Progress towards PT goals: Progressing toward goals    Frequency    Min 2X/week      PT Plan Current plan remains appropriate    Co-evaluation              AM-PAC PT "6 Clicks" Mobility   Outcome Measure  Help needed turning from your back to your side while in a flat bed without using bedrails?: None Help needed moving from lying on your back to sitting on the side of a flat bed without using bedrails?: A Little Help needed moving to and from a bed to a chair (including a wheelchair)?: A Little Help needed standing up from a chair using your arms (e.g., wheelchair or bedside chair)?: Total Help needed to walk in hospital room?: Total Help needed climbing 3-5 steps with a railing? : Total 6 Click Score: 13    End of Session Equipment Utilized During Treatment:  (sliding board) Activity Tolerance: Patient tolerated treatment well Patient left: in bed;with call bell/phone within reach;with bed alarm set   PT Visit Diagnosis: Other abnormalities of gait and mobility (  R26.89);Muscle weakness (generalized) (M62.81);History of falling (Z91.81);Pain Pain - Right/Left: Left Pain - part of body: Leg     Time: 5301-0404 PT Time Calculation (min) (ACUTE ONLY): 39 min  Charges:  $Therapeutic Exercise: 8-22 mins $Therapeutic Activity: 23-37 mins                     Candie Mile, PT, DPT Physical Therapist Acute Rehabilitation Services [place] 01/30/2022, 4:12 PM

## 2022-01-30 NOTE — Progress Notes (Signed)
Pt was scheduled for first rounds HD tx this morning as she refused HD tx yesterday and stated she would just come today. When HD nurse called to get report to bring her up here, primary nurse stated that pt was again refusing HD as it is not her regular day and that she would just come tomorrow on her regular day. I then got on the phone to talk to primary nurse and she reiterated what she had already told the HD nurse that was going to run the tx. Will let PA know that she refused tx again.

## 2022-01-30 NOTE — Progress Notes (Signed)
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  Orthopedic/ID were consulted and patient was started on broad-spectrum antibiotics.  Underwent multiple I&D's by Dr. [name] along with local tissue rearrangement.  Patient was also seen by vascular surgery who did  fistula branch ligation.  She has completed 6-week course of daptomycin on 10/09/2021.  She has been unable to complete hemodialysis in chair and outpatient hemodialysis unit has been hard to find.  She is otherwise medically stable for discharge.   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  Active Problems:   ESRD (end stage renal disease) on dialysis ([place])   Type 2 diabetes mellitus with hyperlipidemia (HCC)   History of CVA (cerebrovascular accident)   Essential hypertension   Anxiety and depression   GASTROESOPHAGEAL REFLUX, NO ESOPHAGITIS   TOBACCO DEPENDENCE   Hyponatremia   Obesity (BMI 30.0-34.9)   Unilateral amputation of left foot (HCC)   Wound infection   History of CVA (cerebrovascular accident) without residual deficits   Hyperkalemia   Wound dehiscence, surgical, sequela   Subacute osteomyelitis of left femur with abscess Status post surgical intervention by Dr. [name] with multiple left hip  debridements.  Completed a 6-week course of daptomycin on 10/09/2021.  Continue muscle relaxant and oxycodone.  Re-imaging on 12/02/2021 of left knee showed healing fracture with callus.  Orthopedics Dr [name] has followed and is doing well.  No left hip restrictions at this time.  On fentanyl patch, oxycodone and Robaxin.  Chronic pain.   On fentanyl patch oxycodone and Robaxin.  Continue amitriptyline.  Anterior chest wall pain.  Musculoskeletal.  Approved  Right thigh mild cellulitis Resolved   History of left intertrochanteric fracture Left hip hardware removed on 08/10/2021 prior to admission.  Physical therapy working to scoot and mobilize with wheelchair.  Will need at least 4 hours to be able to be considered for outpatient hemodialysis.  Have communicated with the patient regarding the need for continued participation.  End-stage renal disease on hemodialysis [facility] on board for hemodialysis needs.  Awaiting for outpatient hemodialysis.  Hyponatremia Sodium today at 129.  Borderline hyperkalemia.  Potassium of 5.6.  Repeat BMP in AM.   Hyperphosphatemia continue sevelamer.  Anemia of chronic disease Latest hemoglobin  at 10.4.  Continue to monitor.  Diabetes mellitus type 2 with hyperglycemia Last hemoglobin A1c of 5.9 on 11/14/2021.  Continue mealtime sliding scale and long-acting insulin.  Closely monitor.   Essential hypertension Continue Coreg, hydralazine, Lasix.  Blood pressure seems to be stable.   History of unspecified CVA Hyperlipidemia Continue Lipitor.   GERD -Continue Protonix and Pepcid   Tobacco use Stable at this time.   Anxiety and depression Continue amitriptyline and Cymbalta.  Fungal rash left stump, on clotrimazole.    DVT prophylaxis: Place TED hose Start: 09/06/21 1507 SCDs Start: 08/31/21 1058   Code Status:  Code Status: Full Code  Disposition: Uncertain at this time  Status is: Inpatient  Remains inpatient  appropriate because: Pending disposition plan, pending tolerance with the dialysis chair.   Family Communication:  None at bedside.  Consultants:  Nephrology Vascular surgery Orthopedics Infectious disease  Procedures:  Multiple I&D's with tissue arrangement. Fistula branch ligation. Hemodialysis  Antimicrobials:  Ceftriaxone 10/15 - 10/18 Doxycycline 10/14 - 10/14 Cefazolin 7/1 - 7/1, 7/20 - 7/20 Daptomycin 6/29 - 8/11  Subjective: Today, patient was seen and examined at bedside.  Denies any nausea vomiting fever or pain chills or rigors.    Objective: Vitals:   01/29/22 0953 01/29/22 1759 01/29/22 2118 01/30/22 0845  BP: (!) 169/62 (!) 168/64 (!) 151/71 (!) 168/60  Pulse: 64 63 64 62  Resp: 19 20 18 18   Temp: 98.1 F (36.7 C) 98.6 F (37 C) 98 F (36.7 C) 98.1 F (36.7 C)  TempSrc: Oral Oral Oral Oral  SpO2: 97% 100% 100% 97%  Weight:      Height:        Intake/Output Summary (Last 24 hours) at 01/30/2022 1244 Last data filed at 01/30/2022 0900 Gross per 24 hour  Intake 370 ml  Output 0 ml  Net 370 ml    Filed Weights   01/21/22 1310 01/24/22 0902 01/24/22 1344  Weight: 93.3 kg 97.8 kg 93.4 kg    Physical Examination: Body mass index is 29.54 kg/m.   General: Obese built, not in obvious distress, chronically ill,  HENT:   No scleral pallor or icterus noted. Oral mucosa is moist.  Chest:  Clear breath sounds.  No crackles or wheezes.  CVS: S1 &S2 heard. No murmur.  Regular rate and rhythm. Abdomen: Soft, nontender, nondistended.  Bowel sounds are heard.   Extremities: Left below-knee amputation, left hip area with healed surgical scar.  Psych: Alert, awake and oriented, normal mood CNS:  No cranial nerve deficits.  Moves extremities. Skin: Warm and dry.     Data Reviewed:   CBC: Recent Labs  Lab 01/24/22 0940 01/30/22 0816  WBC 7.6 5.7  HGB 10.4* 11.4*  HCT 31.3* 35.4*  MCV 89.2 91.9  PLT 235 230     Basic Metabolic Panel: Recent  Labs  Lab 01/24/22 0324 01/30/22 0816  NA 127* 129*  K 5.1 5.6*  CL 86* 90*  CO2 25 24  GLUCOSE 172* 179*  BUN 80* 89*  CREATININE 6.58* 6.39*  CALCIUM 9.4 9.0  PHOS 5.7*  --      Liver Function Tests: Recent Labs  Lab 01/24/22 0324  ALBUMIN 3.3*      Radiology Studies: No results found.    LOS: 157 days    Flora Lipps, MD [facility] Available via Epic secure chat 7am-7pm After these hours, please refer to coverage provider listed on [email or web address] 01/30/2022, 12:44 PM

## 2022-01-30 NOTE — Progress Notes (Signed)
[facility] Progress Note   Subjective:   Patient refusing dialysis yesterday and today.  Discussed importance of going to dialysis when called and that the time of dialysis is never guaranteed due to emergency and varying patient acuity.  Reports understanding. States she will go to HD tomorrow. BP elevated.  Denies CP, SOB, abdominal pain and n/v/d.   Objective Vitals:   01/29/22 0953 01/29/22 1759 01/29/22 2118 01/30/22 0845  BP: (!) 169/62 (!) 168/64 (!) 151/71 (!) 168/60  Pulse: 64 63 64 62  Resp: 19 20 18 18   Temp: 98.1 F (36.7 C) 98.6 F (37 C) 98 F (36.7 C) 98.1 F (36.7 C)  TempSrc: Oral Oral Oral Oral  SpO2: 97% 100% 100% 97%  Weight:      Height:       Physical Exam General:well appearing female in NAD Heart:RRR, no mrg Lungs:CTAB, nml WOB on RA Abdomen:soft, NTND Extremities:1+ LE edema b/l, L BKA Dialysis Access: LU AVF +b/t   Filed Weights   01/21/22 1310 01/24/22 0902 01/24/22 1344  Weight: 93.3 kg 97.8 kg 93.4 kg    Intake/Output Summary (Last 24 hours) at 01/30/2022 1514 Last data filed at 01/30/2022 1300 Gross per 24 hour  Intake 610 ml  Output 0 ml  Net 610 ml    Additional Objective Labs: Basic Metabolic Panel: Recent Labs  Lab 01/24/22 0324 01/30/22 0816  NA 127* 129*  K 5.1 5.6*  CL 86* 90*  CO2 25 24  GLUCOSE 172* 179*  BUN 80* 89*  CREATININE 6.58* 6.39*  CALCIUM 9.4 9.0  PHOS 5.7*  --    Liver Function Tests: Recent Labs  Lab 01/24/22 0324  ALBUMIN 3.3*   CBC: Recent Labs  Lab 01/24/22 0940 01/30/22 0816  WBC 7.6 5.7  HGB 10.4* 11.4*  HCT 31.3* 35.4*  MCV 89.2 91.9  PLT 235 230   CBG: Recent Labs  Lab 01/29/22 1121 01/29/22 1702 01/29/22 2116 01/30/22 0734 01/30/22 1117  GLUCAP 206* 96 97 133* 146*    Medications:  albumin human      (feeding supplement) PROSource Plus  30 mL Oral BID BM   acidophilus  1 capsule Oral Daily   alosetron  1 mg Oral BID   amitriptyline  50 mg Oral QHS    atorvastatin  40 mg Oral q1800   carvedilol  3.125 mg Oral BID   Chlorhexidine Gluconate Cloth  6 each Topical Q0600   darbepoetin (ARANESP) injection - DIALYSIS  100 mcg Subcutaneous Q Fri-1800   docusate sodium  200 mg Oral BID   DULoxetine  60 mg Oral Daily   famotidine  20 mg Oral Daily   fentaNYL  1 patch Transdermal Q72H   furosemide  80 mg Oral Daily   heparin sodium (porcine)       heparin sodium (porcine)  2,000 Units Intravenous Once   hydrALAZINE  50 mg Oral TID   insulin aspart  0-9 Units Subcutaneous TID WC   insulin aspart  6 Units Subcutaneous TID WC   insulin glargine-yfgn  10 Units Subcutaneous QHS   methocarbamol  500 mg Oral QID   multivitamin  1 tablet Oral QHS   pantoprazole  80 mg Oral Daily   polyethylene glycol  17 g Oral Daily   sevelamer carbonate  2,400 mg Oral TID WC   sodium zirconium cyclosilicate  10 g Oral Once per day on Sun Tue Thu Sat    Dialysis Orders: MWF at [facility], 400/500,  EDW 90kg, 2K/2Ca, LUE AVF - heparin 4000 unit IV bolus, no ESA   Assessment/Plan: 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 Completed 6 wk course of IV Daptomycin on 10/12/21. Per Dr. Jess Barters note 12/28/2021 "NO RESTRICTIONS L HIP".  Chronic L hip/residual limb pain: She does have fractures of L femoral neck (where hardware was removed) as well as distal femur Fx. Pain control per primary.  ESRD: Usual MWF schedule. Clots HD system without heparin, 4000 unit bolus and 2000 unit mid-run bolus. Refusing HD this week because she did not like the timing of it.  Discussed importance of compliance and variability of hospital dialysis due to patient acuity and emergency.  Plan for HD tomorrow.  Hyperkalemia: Stable on scheduled lokelma 4 days a week.  HTN/volume: BP meds have been tapered down - currently on Coreg 3.125mg  BID only. Large fluids gains between HD which has been discussed extensively, Ongoing edema, continue max UF  as tolerated. Anemia of ESRD: Hgb 9-10s stable. Last tsat 54%. Continue Aranesp 19mcg weekly.  Secondary HPTH: CorrCa slightly high, not on VDRA. Last phos trending up, continue Renvela as binder. Nutrition: Alb low, continue protein supplements.  Renal diet w/fluid restrictions.   AVF dysfunction (resolved): S/p branch ligation 09/19/2021  Dr. Virl Cagey. AVF working well now. DM2: Insulin per primary Dispo: SNF previously recommended, but she declined. Not home HD candidate.  Dialyzed in recliner on 9/4 but signed off early due to pain, has declined to come in a recliner since then.  Strongly discussed on the importance of increasing her mobility as much as possible while she is in her room in addition to following her ongoing fluid restriction. Also reinforced the importance of being in the chair for HD treatments to improve strength and mobility. Does not like to do PT after HD, will try to do HD in the afternoons so she can do PT in the AM. Per Renal Navigator notes there are no clinics that will accept patient unless she will have HD in chair. Will continue to encourage use of chair for HD  Jen Mow, PA-C [place] 01/30/2022,3:14 PM  LOS: 157 days

## 2022-01-30 NOTE — Evaluation (Signed)
Speech Language Pathology Evaluation Patient Details Name: [name] MRN: [id number] DOB: 19-May-1965 Today's Date: 01/30/2022 Time: 1125-1150 SLP Time Calculation (min) (ACUTE ONLY): 25 min  Problem List:  Patient Active Problem List   Diagnosis Date Noted   Wound dehiscence, surgical, sequela    Abscess of left hip 08/26/2021   History of CVA (cerebrovascular accident) 60/12/9321   Hardware complicating wound infection ([place])    Subacute osteomyelitis of left femur with abscess     Septic arthritis of hip ([place]) 07/30/2021   Skin abscess L hip 07/28/2021   HCAP (healthcare-associated pneumonia) 07/27/2021   Hyperkalemia 55/73/2202   Metabolic acidemia 54/27/0623   Closed fracture of left distal femur ([place]) 07/22/2021   Type 2 diabetes mellitus with hyperlipidemia ([place]) 07/22/2021   Infection of superficial incisional surgical site after procedure 03/09/2020   Abscess    Hypoglycemia    Essential hypertension    Sleep disturbance    ESRD (end stage renal disease) on dialysis ([place]) 01/24/2020   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[place]) 01/23/2020   Hyponatremia    Constipation    Chronic osteomyelitis involving left ankle and foot ([place])    Ulcer of left foot with necrosis of bone ([place])    Wound infection 12/28/2019   History of [place] amputation of left foot ([place]) 12/25/2017   History of CVA (cerebrovascular accident) without residual deficits 07/04/2017   Cerebral thrombosis with cerebral infarction 04/08/2017   Right sided weakness 04/07/2017   Hyperhidrosis 09/01/2016   Migraine with aura and without status migrainosus, not intractable 07/28/2016   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[place]) 08/04/2014   CKD stage 3 due to type 2 diabetes mellitus ([place]) 06/27/2014   Vitamin D insufficiency 05/08/2014   Obesity (BMI 30.0-34.9) 05/08/2014   Unilateral amputation of left foot ([place]) 05/08/2014   Bursitis of left shoulder 02/14/2014   Neck pain  01/03/2014   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.23) 01/14/2013   Insomnia 08/04/2012   Diabetic neuropathy, painful ([place]) 08/04/2011   Anxiety and depression 05/16/2010   Female stress incontinence 11/01/2007   TOBACCO DEPENDENCE 04/30/2006   GASTROESOPHAGEAL REFLUX, NO ESOPHAGITIS 04/30/2006   Irritable bowel syndrome 04/30/2006   Past Medical History:  Past Medical History:  Diagnosis Date   Anxiety 2002   Chest tightness    Chronic kidney disease    Depression 2001   Diabetes type 1, uncontrolled    at age 53   Diabetic neuropathy ([place])    Essential hypertension 2015   GERD (gastroesophageal reflux disease)    about age of 28   Headache    Nausea and vomiting in adult    Stroke [facility])    Urinary frequency    Yeast vaginitis    Past Surgical History:  Past Surgical History:  Procedure Laterality Date   A/V FISTULAGRAM Left 08/22/2021   Procedure: A/V Fistulagram;  Surgeon: Cherre Robins, MD;  Location: [place] [facility];  Service: Cardiovascular;  Laterality: Left;   ACHILLES TENDON SURGERY Left 03/10/2013   Procedure: LEFT CHOPART AMPUTATION/ LEFT TENDON ACHILLES [place];  Surgeon: Wylene Simmer, MD;  Location: [place];  Service: Orthopedics;  Laterality: Left;   AMPUTATION Left 06/15/2012   Procedure: AMPUTATION DIGIT;  Surgeon: Meredith Pel, MD;  Location: [place];  Service: Orthopedics;  Laterality: Left;  Left great toe revision amputation   AMPUTATION Left 01/12/2020   Procedure: AMPUTATION BELOW KNEE;  Surgeon: Wylene Simmer, MD;  Location: [place];  Service: Orthopedics;  Laterality: Left;   APPLICATION OF WOUND VAC  08/28/2021   Procedure: APPLICATION OF WOUND VAC;  Surgeon: Newt Minion, MD;  Location: [place];  Service: Orthopedics;;   AV FISTULA PLACEMENT Left 01/09/2020   Procedure: LEFT UPPER ARM ARTERIOVENOUS (AV) FISTULA CREATION;  Surgeon: Cherre Robins, MD;  Location: [place];  Service: Vascular;  Laterality: Left;    ENDOMETRIAL ABLATION     HARDWARE REMOVAL Left 08/10/2021   Procedure: LEFT HIP REMOVAL NAIL;  Surgeon: Newt Minion, MD;  Location: [place];  Service: Orthopedics;  Laterality: Left;   I & D EXTREMITY Left 01/19/2020   Procedure: IRRIGATION AND DEBRIDEMENT Left Hip;  Surgeon: Rod Can, MD;  Location: [place];  Service: Orthopedics;  Laterality: Left;   I & D EXTREMITY Left 08/28/2021   Procedure: LEFT HIP DEBRIDEMENT;  Surgeon: Newt Minion, MD;  Location: [place];  Service: Orthopedics;  Laterality: Left;   I & D EXTREMITY Left 08/31/2021   Procedure: REPEAT DEBRIDEMENT LEFT HIP;  Surgeon: Newt Minion, MD;  Location: [place];  Service: Orthopedics;  Laterality: Left;   INSERTION OF DIALYSIS CATHETER Right 01/09/2020   Procedure: INSERTION OF DIALYSIS CATHETER;  Surgeon: Cherre Robins, MD;  Location: [place];  Service: Vascular;  Laterality: Right;   INTRAMEDULLARY (IM) NAIL INTERTROCHANTERIC Left 12/29/2019   Procedure: INTRAMEDULLARY (IM) NAIL INTERTROCHANTRIC;  Surgeon: Rod Can, MD;  Location: [facility];  Service: Orthopedics;  Laterality: Left;   IR ANGIOGRAM EXTREMITY LEFT  12/17/2018   IR FEM POP ART ATHERECT INC PTA MOD SED  12/17/2018   IR RADIOLOGIST EVAL & MGMT  11/30/2018   IR RADIOLOGIST EVAL & MGMT  12/09/2018   IR RADIOLOGIST EVAL & MGMT  02/08/2019   IR US GUIDE VASC ACCESS RIGHT  12/17/2018   LAPAROSCOPIC CHOLECYSTECTOMY  01/30/2015   [facility]    REVISON OF ARTERIOVENOUS FISTULA Left 09/19/2021   Procedure: LIGATION OF LEFT ARM ARTERIOVENOUS FISTULA;  Surgeon: Broadus John, MD;  Location: [facility];  Service: Vascular;  Laterality: Left;   HPI:  56 y.o. female presented to ED 6/26 from dialysis with increased bloody drainage from L hip wound. 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 s/p hardware removal of left hip with partial resection of tuft tissue and femure that were nonviable on 08/10/21 Discharged home 6/23. underwent L hip  debridement and wound vac placement 6/28; +Left intertrochanteric non-union,  Fracture of left distal femur  PMH: hypertension, hyperlipidemia, ESRD on HD MWF, history of left BKA in 2021, depression/anxiety, stroke, tobacco use, T2DM,  insomnia, chronic pain syndrome; ST consulted for speech/language assessment on 01/28/22.   Assessment / Plan / Recommendation Clinical Impression  Pt seen for speech/language/cognitive assessment via the Ada Mental Status Examination (SLUMS) with a score obtained of 24/30 with a typical score on this assessment being 27/30.  Deficits noted were decreased recall of new information with a time delay being 40% accurate (recalled 2/5 objects without cueing and 4/5 with choices/category cues provided by SLP).  Simple calculation error with verbal cueing required to complete task correctly.  Other subtests were accurate including digit reversal up to 4 digits, recall of simple paragraph (100% accurate), oriented x4, reading simple information and graphic expression accurate for simple tasks and word fluency WFL.  Pt's speech was intelligible within complex conversation without presence of dysarthria or aphasia present.  Pt is likely functioning at baseline level for  cognition as she denoted "memory issues since previous stroke" during interview/obtaining pmhx prior to assessment.  ST will s/o in acute setting.  Thank you for this consult.    SLP Assessment  SLP Recommendation/Assessment: Patient does not need any further Speech Language Pathology Services SLP Visit Diagnosis: Cognitive communication deficit (R41.841)    Recommendations for follow up therapy are one component of a multi-disciplinary discharge planning process, led by the attending physician.  Recommendations may be updated based on patient status, additional functional criteria and insurance authorization.    Follow Up Recommendations  No SLP follow up    Assistance Recommended at  Discharge  Intermittent Supervision/Assistance  Functional Status Assessment Patient has had a recent decline in their functional status and demonstrates the ability to make significant improvements in function in a reasonable and predictable amount of time.  Frequency and Duration  (evaluation only)         SLP Evaluation Cognition  Overall Cognitive Status: History of cognitive impairments - at baseline Arousal/Alertness: Awake/alert Orientation Level: Oriented X4 Year: 2023 Month: November Day of Week: Correct Attention: Sustained Sustained Attention: Appears intact Memory: Impaired Memory Impairment: Decreased recall of new information;Retrieval deficit;Decreased short term memory Decreased Short Term Memory: Verbal basic;Functional basic Awareness: Appears intact Problem Solving: Appears intact Safety/Judgment: Appears intact       Comprehension  Auditory Comprehension Overall Auditory Comprehension: Appears within functional limits for tasks assessed Conversation: Complex Interfering Components: Pain EffectiveTechniques: Repetition Visual Recognition/Discrimination Discrimination: Within Function Limits Reading Comprehension Reading Status: Within funtional limits    Expression Expression Primary Mode of Expression: Verbal Verbal Expression Overall Verbal Expression: Appears within functional limits for tasks assessed Initiation: No impairment Level of Generative/Spontaneous Verbalization: Conversation Naming: No impairment Pragmatics: No impairment Non-Verbal Means of Communication: Not applicable Written Expression Dominant Hand: Right Written Expression: Within Functional Limits   Oral / Motor  Oral Motor/Sensory Function Overall Oral Motor/Sensory Function: Within functional limits Motor Speech Overall Motor Speech: Appears within functional limits for tasks assessed Respiration: Within functional limits Phonation: Normal Resonance: Within functional  limits Articulation: Within functional limitis Intelligibility: Intelligible Motor Planning: Witnin functional limits Motor Speech Errors: Not applicable            Elvina Sidle, M.S., CCC-SLP 01/30/2022, 11:59 AM

## 2022-01-31 DIAGNOSIS — N186 End stage renal disease: Secondary | ICD-10-CM | POA: Diagnosis not present

## 2022-01-31 DIAGNOSIS — T148XXA Other injury of unspecified body region, initial encounter: Secondary | ICD-10-CM | POA: Diagnosis not present

## 2022-01-31 DIAGNOSIS — M86252 Subacute osteomyelitis, left femur: Secondary | ICD-10-CM | POA: Diagnosis not present

## 2022-01-31 DIAGNOSIS — Z8673 Personal history of transient ischemic attack (TIA), and cerebral infarction without residual deficits: Secondary | ICD-10-CM | POA: Diagnosis not present

## 2022-01-31 LAB — BASIC METABOLIC PANEL
Anion gap: 13 (ref 5–15)
BUN: 104 mg/dL — ABNORMAL HIGH (ref 6–20)
CO2: 23 mmol/L (ref 22–32)
Calcium: 8.6 mg/dL — ABNORMAL LOW (ref 8.9–10.3)
Chloride: 90 mmol/L — ABNORMAL LOW (ref 98–111)
Creatinine, Ser: 6.96 mg/dL — ABNORMAL HIGH (ref 0.44–1.00)
GFR, Estimated: 7 mL/min — ABNORMAL LOW (ref >60)
Glucose, Bld: 169 mg/dL — ABNORMAL HIGH (ref 70–99)
Potassium: 5.7 mmol/L — ABNORMAL HIGH (ref 3.5–5.1)
Sodium: 126 mmol/L — ABNORMAL LOW (ref 135–145)

## 2022-01-31 LAB — CBC
HCT: 34.1 % — ABNORMAL LOW (ref 36.0–46.0)
Hemoglobin: 11 g/dL — ABNORMAL LOW (ref 12.0–15.0)
MCH: 29.4 pg (ref 26.0–34.0)
MCHC: 32.3 g/dL (ref 30.0–36.0)
MCV: 91.2 fL (ref 80.0–100.0)
Platelets: 230 10*3/uL (ref 150–400)
RBC: 3.74 MIL/uL — ABNORMAL LOW (ref 3.87–5.11)
RDW: 15.8 % — ABNORMAL HIGH (ref 11.5–15.5)
WBC: 7.2 10*3/uL (ref 4.0–10.5)
nRBC: 0 % (ref 0.0–0.2)

## 2022-01-31 LAB — GLUCOSE, CAPILLARY
Glucose-Capillary: 99 mg/dL (ref 70–99)
Glucose-Capillary: 206 mg/dL — ABNORMAL HIGH (ref 70–99)
Glucose-Capillary: 170 mg/dL — ABNORMAL HIGH (ref 70–99)
Glucose-Capillary: 199 mg/dL — ABNORMAL HIGH (ref 70–99)

## 2022-01-31 MED ORDER — HEPARIN SODIUM (PORCINE) 1000 UNIT/ML IJ SOLN
INTRAMUSCULAR | Status: AC
  Administered 2022-01-31: 4000 [IU] via INTRAVENOUS_CENTRAL
  Filled 2022-01-31: qty 4

## 2022-01-31 MED ORDER — HEPARIN SODIUM (PORCINE) 1000 UNIT/ML IJ SOLN
INTRAMUSCULAR | Status: AC
  Administered 2022-01-31: 2000 [IU] via INTRAVENOUS
  Filled 2022-01-31: qty 2

## 2022-01-31 NOTE — Progress Notes (Signed)
Received patient in bed to unit.  Alert and oriented.  Informed consent signed and in chart.   Patient tolerated well.  Transported back to the room  Alert, without acute distress.  Hand-off given to patient's nurse.   Access used: LUA AVF Access issues: none  Total UF removed: 4.5L Medication(s) given: none Post HD VS: BP: 158/61, Pule: 66, RR: 12, O2sat: 100 Post HD weight: 93.1 kg   [facility]

## 2022-01-31 NOTE — Progress Notes (Signed)
Pt off the floor to hemodialysis

## 2022-01-31 NOTE — Progress Notes (Signed)
PROGRESS NOTE    [name]  FTD:[id number] DOB: 1966-01-28 DOA: 08/26/2021 PCP: [name], MD    Brief Narrative:   Patient is 56 year old female with history of hypertension, hyperlipidemia, diabetes mellitus type 2, end-stage renal disease on hemodialysis, status post left BKA for chronic calcaneus osteomyelitis on 01/12/2020, bipolar disorder, left distal femoral fracture after a fall on 07/19/2021 deemed  nonoperative, admission from 08/03/2021-08/23/2021 for left femoral osteomyelitis requiring partial resection of femur and rearrangements of bony fragments with subsequent antibiotic treatment presented to the hospital with left hip bleeding and was found to have subacute osteomyelitis and abscess of left femur.  Orthopedic/ID were consulted and patient was started on broad-spectrum antibiotics.  Underwent multiple I&D's by Dr. [name] along with local tissue rearrangement.  Patient was also seen by vascular surgery who did  fistula branch ligation.  She has completed 6-week course of daptomycin on 10/09/2021.  She has been unable to complete hemodialysis in chair and outpatient hemodialysis unit has been hard to find.  She is otherwise medically stable for discharge.   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  Active Problems:   ESRD (end stage renal disease) on dialysis ([place])   Type 2 diabetes mellitus with hyperlipidemia (HCC)   History of CVA (cerebrovascular accident)   Essential hypertension   Anxiety and depression   GASTROESOPHAGEAL REFLUX, NO ESOPHAGITIS   TOBACCO DEPENDENCE   Hyponatremia   Obesity (BMI 30.0-34.9)   Unilateral amputation of left foot (HCC)   Wound infection   History of CVA (cerebrovascular accident) without residual deficits   Hyperkalemia   Wound dehiscence, surgical, sequela   Subacute osteomyelitis of left femur with abscess Status post surgical intervention by Dr. [name] with multiple left hip  debridements.  Completed a 6-week course of daptomycin on 10/09/2021.  Continue muscle relaxant and oxycodone.  Re-imaging on 12/02/2021 of left knee showed healing fracture with callus.  Orthopedics Dr [name] has followed and is doing well.  No left hip restrictions at this time.  On fentanyl patch, oxycodone and Robaxin.  Chronic pain.   On fentanyl patch oxycodone and Robaxin.  Continue amitriptyline.  Anterior chest wall pain.  Musculoskeletal.  Proved  Right thigh mild cellulitis Resolved   History of left intertrochanteric fracture Left hip hardware removed on 08/10/2021 prior to admission.  Physical therapy working to scoot and mobilize with wheelchair.  Will need at least 4 hours to be able to be considered for outpatient hemodialysis.  Encouraged physical therapy and participation.  End-stage renal disease on hemodialysis [facility] on board for hemodialysis needs.  Awaiting for outpatient hemodialysis.  Hyponatremia Sodium today at 126.  Asymptomatic  Borderline hyperkalemia.  Potassium of 5.7.  Can be adjusted with hemodialysis.  On Lokelma as needed   Hyperphosphatemia continue sevelamer.  Anemia of chronic disease Latest hemoglobin  at 11.0.  Continue to monitor.  Diabetes mellitus type 2 with hyperglycemia Last hemoglobin A1c of 5.9 on 11/14/2021.  Continue mealtime sliding scale and long-acting insulin.  Closely monitor.   Essential hypertension Continue Coreg, hydralazine, Lasix.    History of unspecified CVA Hyperlipidemia Continue Lipitor.   GERD -Continue Protonix and Pepcid   Tobacco use Stable at this time.   Anxiety and depression Continue amitriptyline and Cymbalta.  Fungal rash left stump, on clotrimazole.    DVT prophylaxis: Place TED hose Start: 09/06/21 1507 SCDs Start: 08/31/21 1058   Code Status:     Code Status: Full  Code  Disposition: Uncertain at this time  Status is: Inpatient  Remains inpatient appropriate because: Pending  disposition plan, pending tolerance with the dialysis chair.   Family Communication:  None at bedside.  Consultants:  Nephrology Vascular surgery Orthopedics Infectious disease  Procedures:  Multiple I&D's with tissue arrangement. Fistula branch ligation. Hemodialysis  Antimicrobials:  Ceftriaxone 10/15 - 10/18 Doxycycline 10/14 - 10/14 Cefazolin 7/1 - 7/1, 7/20 - 7/20 Daptomycin 6/29 - 8/11  Subjective: Today, patient was seen and examined at bedside.  Patient denies any nausea, vomiting, fever chills or rigor.  Complains of generalized weakness   Objective: Vitals:   01/31/22 1200 01/31/22 1218 01/31/22 1227 01/31/22 1252  BP: (!) 152/71 (!) 158/61  (!) 163/60  Pulse: 65 66 65 70  Resp: 10 12 15 20   Temp:  97.9 F (36.6 C)  98.3 F (36.8 C)  TempSrc:  Oral  Oral  SpO2: 98% 99% 97% 100%  Weight:  93.1 kg    Height:        Intake/Output Summary (Last 24 hours) at 01/31/2022 1407 Last data filed at 01/31/2022 1316 Gross per 24 hour  Intake 840 ml  Output 5200 ml  Net -4360 ml    Filed Weights   01/24/22 1344 01/31/22 0820 01/31/22 1218  Weight: 93.4 kg 97.9 kg 93.1 kg    Physical Examination: Body mass index is 29.45 kg/m.   General: Obese built, chronically ill, alert awake and Communicative. HENT:   No scleral pallor or icterus noted. Oral mucosa is moist.  Chest:  Clear breath sounds.  No crackles or wheezes.  CVS: S1 &S2 heard. No murmur.  Regular rate and rhythm. Abdomen: Soft, nontender, nondistended.  Bowel sounds are heard.   Extremities: Left below-knee amputation, left hip with healed surgical scar.  Psych: Alert, awake and oriented, normal mood CNS:  No cranial nerve deficits.  Moves extremities. Skin: Warm and dry.     Data Reviewed:   CBC: Recent Labs  Lab 01/30/22 0816 01/31/22 0838  WBC 5.7 7.2  HGB 11.4* 11.0*  HCT 35.4* 34.1*  MCV 91.9 91.2  PLT 230 230     Basic Metabolic Panel: Recent Labs  Lab 01/30/22 0816  01/31/22 0837  NA 129* 126*  K 5.6* 5.7*  CL 90* 90*  CO2 24 23  GLUCOSE 179* 169*  BUN 89* 104*  CREATININE 6.39* 6.96*  CALCIUM 9.0 8.6*     Liver Function Tests: No results for input(s): "AST", "ALT", "ALKPHOS", "BILITOT", "PROT", "ALBUMIN" in the last 168 hours.    Radiology Studies: No results found.    LOS: 158 days    Flora Lipps, MD [facility] Available via Epic secure chat 7am-7pm After these hours, please refer to coverage provider listed on [email or web address] 01/31/2022, 2:07 PM

## 2022-01-31 NOTE — Progress Notes (Addendum)
[name] Progress Note   Subjective:    Patient seen and examined on HD. Apparently she refused HD X 2 recently based on schedule time. Currently tolerating UFG 4.5L. No acute complaints.  Objective Vitals:   01/31/22 0650 01/31/22 0820 01/31/22 0840 01/31/22 0900  BP: (!) 185/70 (!) 173/60 (!) 180/62 (!) 171/60  Pulse: (!) 58 62 60 (!) 57  Resp: 18 14 12 11   Temp: 98.1 F (36.7 C) 98.1 F (36.7 C)    TempSrc: Oral Oral    SpO2: 100% 98% 98% 97%  Weight:      Height:       Physical Exam General:well appearing female in NAD Heart:RRR, no mrg Lungs:CTAB, nml WOB on RA Abdomen:soft, NTND Extremities:1+ LE edema b/l, L BKA Dialysis Access: LU AVF +b/t   Filed Weights   01/21/22 1310 01/24/22 0902 01/24/22 1344  Weight: 93.3 kg 97.8 kg 93.4 kg    Intake/Output Summary (Last 24 hours) at 01/31/2022 0918 Last data filed at 01/31/2022 0350 Gross per 24 hour  Intake 720 ml  Output 600 ml  Net 120 ml    Additional Objective Labs: Basic Metabolic Panel: Recent Labs  Lab 01/30/22 0816 01/31/22 0837  NA 129* 126*  K 5.6* 5.7*  CL 90* 90*  CO2 24 23  GLUCOSE 179* 169*  BUN 89* 104*  CREATININE 6.39* 6.96*  CALCIUM 9.0 8.6*   Liver Function Tests: No results for input(s): "AST", "ALT", "ALKPHOS", "BILITOT", "PROT", "ALBUMIN" in the last 168 hours. No results for input(s): "LIPASE", "AMYLASE" in the last 168 hours. CBC: Recent Labs  Lab 01/24/22 0940 01/30/22 0816 01/31/22 0838  WBC 7.6 5.7 7.2  HGB 10.4* 11.4* 11.0*  HCT 31.3* 35.4* 34.1*  MCV 89.2 91.9 91.2  PLT 235 230 230   Blood Culture    Component Value Date/Time   SDES TISSUE 08/28/2021 0917   SPECREQUEST LEFT THIGH TISSUE 08/28/2021 0917   CULT  08/28/2021 0917    MODERATE ENTEROCOCCUS FAECIUM VANCOMYCIN RESISTANT ENTEROCOCCUS ISOLATED NO ANAEROBES ISOLATED Sent to [place] for further susceptibility testing. Performed at [facility], [place] [address], [place],  [place] [address]    REPTSTATUS 09/02/2021 FINAL 08/28/2021 8299    Cardiac Enzymes: No results for input(s): "CKTOTAL", "CKMB", "CKMBINDEX", "TROPONINI" in the last 168 hours. CBG: Recent Labs  Lab 01/30/22 0734 01/30/22 1117 01/30/22 1725 01/30/22 2120 01/31/22 0736  GLUCAP 133* 146* 161* 203* 99   Iron Studies: No results for input(s): "IRON", "TIBC", "TRANSFERRIN", "FERRITIN" in the last 72 hours. Lab Results  Component Value Date   INR 1.1 12/28/2019   INR 1.2 12/17/2018   INR 0.99 04/07/2017   Studies/Results: No results found.  Medications:  albumin human      (feeding supplement) PROSource Plus  30 mL Oral BID BM   acidophilus  1 capsule Oral Daily   alosetron  1 mg Oral BID   amitriptyline  50 mg Oral QHS   atorvastatin  40 mg Oral q1800   carvedilol  3.125 mg Oral BID   Chlorhexidine Gluconate Cloth  6 each Topical Q0600   docusate sodium  200 mg Oral BID   DULoxetine  60 mg Oral Daily   famotidine  20 mg Oral Daily   [START ON 02/02/2022] fentaNYL  1 patch Transdermal Q72H   furosemide  80 mg Oral Daily   heparin sodium (porcine)  2,000 Units Intravenous Once   hydrALAZINE  50 mg Oral TID   insulin aspart  0-9 Units Subcutaneous TID WC   insulin aspart  6 Units Subcutaneous TID WC   insulin glargine-yfgn  10 Units Subcutaneous QHS   methocarbamol  500 mg Oral QID   multivitamin  1 tablet Oral QHS   pantoprazole  80 mg Oral Daily   polyethylene glycol  17 g Oral Daily   sevelamer carbonate  2,400 mg Oral TID WC   sodium zirconium cyclosilicate  10 g Oral Once per day on Sun Tue Thu Sat    Dialysis Orders: MWF at [facility], 400/500, EDW 90kg, 2K/2Ca, LUE AVF - heparin 4000 unit IV bolus, no ESA   Assessment/Plan: 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 Completed 6 wk course of IV Daptomycin on 10/12/21. Per Dr. Jess Barters note 12/28/2021 "NO RESTRICTIONS L HIP".  Chronic L hip/residual limb pain: She does  have fractures of L femoral neck (where hardware was removed) as well as distal femur Fx. Pain control per primary.  ESRD: Usual MWF schedule. Clots HD system without heparin, 4000 unit bolus and 2000 unit mid-run bolus. Refusing HD this week because she did not like the timing of it.  Discussed importance of compliance and variability of hospital dialysis due to patient acuity and emergency.  On HD.  Hyperkalemia: Stable on scheduled lokelma 4 days a week. K+ 5.7. She missed two treatments. Follow trend closely. HTN/volume: BP meds have been tapered down - currently on Coreg 3.125mg  BID only. BP high today but she missed two treatments. Goal for BP to return to baseline now that she is back on schedule. Large fluids gains between HD which has been discussed extensively, Ongoing edema, continue max UF as tolerated. Anemia of ESRD: Hgb 9-10s stable. Last tsat 54%. Continue Aranesp 141mcg weekly.  Secondary HPTH: CorrCa slightly high, not on VDRA. Last phos trending up, continue Renvela as binder. Nutrition: Alb low, continue protein supplements.  Renal diet w/fluid restrictions.   AVF dysfunction (resolved): S/p branch ligation 09/19/2021  Dr. Virl Cagey. AVF working well now. DM2: Insulin per primary Dispo: SNF previously recommended, but she declined. Not home HD candidate.  Dialyzed in recliner on 9/4 but signed off early due to pain, has declined to come in a recliner since then.  Strongly discussed on the importance of increasing her mobility as much as possible while she is in her room in addition to following her ongoing fluid restriction. Also reinforced the importance of being in the chair for HD treatments to improve strength and mobility. Does not like to do PT after HD, will try to do HD in the afternoons so she can do PT in the AM. Per Renal Navigator notes there are no clinics that will accept patient unless she will have HD in chair. Will continue to encourage use of chair for HD.  ADDENDUM:  Nephrology service will not see [name] over the weekend. Please page me: [phone] for any issues and/or questions- Tobie Poet, NP  Tobie Poet, NP [facility] 01/31/2022,9:18 AM  LOS: 158 days

## 2022-01-31 NOTE — Progress Notes (Signed)
Pt home medication lotronex that she supplied to pharmacy has ran out. Pt states she can't get any refills until she sees her PCP

## 2022-01-31 NOTE — Progress Notes (Signed)
Mobility Specialist Progress Note:   01/31/22 1550  Mobility  Activity  (bed level stretches + exercises)  Assistive Device None  Range of Motion/Exercises Active;Passive;Right leg;Left leg  Activity Response Tolerated well  Mobility Referral Yes  $Mobility charge 1 Mobility   Pt agreeable to mobility session. C/o minor back pain, and R knee pain. Tolerated exercises and stretches well. Left with all needs met.   Nelta Numbers Mobility Specialist Please contact via SecureChat or  Rehab office at [phone]

## 2022-02-01 DIAGNOSIS — M86252 Subacute osteomyelitis, left femur: Secondary | ICD-10-CM | POA: Diagnosis not present

## 2022-02-01 DIAGNOSIS — T148XXA Other injury of unspecified body region, initial encounter: Secondary | ICD-10-CM | POA: Diagnosis not present

## 2022-02-01 DIAGNOSIS — Z8673 Personal history of transient ischemic attack (TIA), and cerebral infarction without residual deficits: Secondary | ICD-10-CM | POA: Diagnosis not present

## 2022-02-01 DIAGNOSIS — N186 End stage renal disease: Secondary | ICD-10-CM | POA: Diagnosis not present

## 2022-02-01 LAB — RENAL FUNCTION PANEL
Albumin: 3.7 g/dL (ref 3.5–5.0)
Anion gap: 12 (ref 5–15)
BUN: 70 mg/dL — ABNORMAL HIGH (ref 6–20)
CO2: 26 mmol/L (ref 22–32)
Calcium: 8.9 mg/dL (ref 8.9–10.3)
Chloride: 92 mmol/L — ABNORMAL LOW (ref 98–111)
Creatinine, Ser: 5.78 mg/dL — ABNORMAL HIGH (ref 0.44–1.00)
GFR, Estimated: 8 mL/min — ABNORMAL LOW (ref >60)
Glucose, Bld: 251 mg/dL — ABNORMAL HIGH (ref 70–99)
Phosphorus: 4.9 mg/dL — ABNORMAL HIGH (ref 2.5–4.6)
Potassium: 4.9 mmol/L (ref 3.5–5.1)
Sodium: 130 mmol/L — ABNORMAL LOW (ref 135–145)

## 2022-02-01 LAB — GLUCOSE, CAPILLARY
Glucose-Capillary: 144 mg/dL — ABNORMAL HIGH (ref 70–99)
Glucose-Capillary: 210 mg/dL — ABNORMAL HIGH (ref 70–99)
Glucose-Capillary: 208 mg/dL — ABNORMAL HIGH (ref 70–99)
Glucose-Capillary: 145 mg/dL — ABNORMAL HIGH (ref 70–99)

## 2022-02-01 NOTE — Progress Notes (Signed)
PROGRESS NOTE    [name]  WJX:[id number] DOB: 1966/03/01 DOA: 08/26/2021 PCP: [name], MD    Brief Narrative:   Patient is 56 year old female with history of hypertension, hyperlipidemia, diabetes mellitus type 2, end-stage renal disease on hemodialysis, status post left BKA for chronic calcaneus osteomyelitis on 01/12/2020, bipolar disorder, left distal femoral fracture after a fall on 07/19/2021 deemed  nonoperative, admission from 08/03/2021-08/23/2021 for left femoral osteomyelitis requiring partial resection of femur and rearrangements of bony fragments with subsequent antibiotic treatment presented to the hospital with left hip bleeding and was found to have subacute osteomyelitis and abscess of left femur.  Orthopedic/ID were consulted and patient was started on broad-spectrum antibiotics.  Underwent multiple I&D's by Dr. [name] along with local tissue rearrangement.  Patient was also seen by vascular surgery who did  fistula branch ligation.  She has completed 6-week course of daptomycin on 10/09/2021.  She has been unable to complete hemodialysis in chair and outpatient hemodialysis unit has been hard to find.  She is otherwise medically stable for discharge.   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  Active Problems:   ESRD (end stage renal disease) on dialysis ([place])   Type 2 diabetes mellitus with hyperlipidemia (HCC)   History of CVA (cerebrovascular accident)   Essential hypertension   Anxiety and depression   GASTROESOPHAGEAL REFLUX, NO ESOPHAGITIS   TOBACCO DEPENDENCE   Hyponatremia   Obesity (BMI 30.0-34.9)   Unilateral amputation of left foot (HCC)   Wound infection   History of CVA (cerebrovascular accident) without residual deficits   Hyperkalemia   Wound dehiscence, surgical, sequela   Subacute osteomyelitis of left femur with abscess Status post surgical intervention by Dr. [name] with multiple left hip  debridements.  Completed a 6-week course of daptomycin on 10/09/2021.  Continue muscle relaxant and oxycodone.  Re-imaging on 12/02/2021 of left knee showed healing fracture with callus.  Orthopedics Dr [name] has followed and is doing well.  No left hip restrictions at this time.  On fentanyl patch, oxycodone and Robaxin.  Chronic pain.   On fentanyl patch oxycodone and Robaxin.  Continue amitriptyline.  Anterior chest wall pain.  Musculoskeletal.  Proved  Right thigh mild cellulitis Resolved   History of left intertrochanteric fracture Left hip hardware removed on 08/10/2021 prior to admission.  Physical therapy working to scoot and mobilize with wheelchair.  Will need at least 4 hours to be able to be considered for outpatient hemodialysis.  Encouraged physical therapy and participation.  End-stage renal disease on hemodialysis [facility] on board for hemodialysis needs.  Awaiting for outpatient hemodialysis.  Hyponatremia Sodium today at 126.  Asymptomatic  Borderline hyperkalemia.  Potassium of 5.7 on 01/31/2022.  Can be adjusted with hemodialysis.  On Lokelma as needed.  Check BMP in AM.   Hyperphosphatemia continue sevelamer.  Anemia of chronic disease Latest hemoglobin  at 11.0.  Continue to monitor.  Diabetes mellitus type 2 with hyperglycemia Last hemoglobin A1c of 5.9 on 11/14/2021.  Continue mealtime sliding scale and long-acting insulin.  Closely monitor.   Essential hypertension Continue Coreg, hydralazine, Lasix.    History of unspecified CVA Hyperlipidemia Continue Lipitor.   GERD -Continue Protonix and Pepcid   Tobacco use Stable at this time.   Anxiety and depression Continue amitriptyline and Cymbalta.  Fungal rash left stump, on clotrimazole.    DVT prophylaxis: Place TED hose Start: 09/06/21 1507 SCDs Start: 08/31/21 1058   Code Status:  Code Status: Full Code  Disposition: Uncertain at this time  Status is: Inpatient  Remains  inpatient appropriate because: Pending disposition plan, pending tolerance with the dialysis chair.   Family Communication:  None at bedside.  Consultants:  Nephrology Vascular surgery Orthopedics Infectious disease  Procedures:  Multiple I&D's with tissue arrangement. Fistula branch ligation. Hemodialysis  Antimicrobials:  Ceftriaxone 10/15 - 10/18 Doxycycline 10/14 - 10/14 Cefazolin 7/1 - 7/1, 7/20 - 7/20 Daptomycin 6/29 - 8/11  Subjective: Today, patient was seen and examined at bedside.  No interval complaints reported.  Feels fatigued and weak  Objective: Vitals:   01/31/22 1633 01/31/22 2114 02/01/22 0845 02/01/22 0848  BP: (!) 145/58 (!) 136/46 (!) 165/58 (!) 164/58  Pulse: 66 73  72  Resp: 19 18  18   Temp: 98.3 F (36.8 C) 98.7 F (37.1 C)    TempSrc: Oral Oral    SpO2: 96% 100%  100%  Weight:  96.9 kg    Height:        Intake/Output Summary (Last 24 hours) at 02/01/2022 1202 Last data filed at 02/01/2022 3559 Gross per 24 hour  Intake 1280 ml  Output 4600 ml  Net -3320 ml    Filed Weights   01/31/22 0820 01/31/22 1218 01/31/22 2114  Weight: 97.9 kg 93.1 kg 96.9 kg    Physical Examination: Body mass index is 30.65 kg/m.   General: Obese, chronically ill, alert awake and Communicative  HENT:   No scleral pallor or icterus noted. Oral mucosa is moist.  Chest:  Clear breath sounds.  No crackles or wheezes.  CVS: S1 &S2 heard. No murmur.  Regular rate and rhythm. Abdomen: Soft, nontender, nondistended.  Bowel sounds are heard.   Extremities: Left below-knee amputation, left hip with healed surgical scar.    Psych: Alert, awake and oriented, normal mood CNS:  No cranial nerve deficits.  Moves extremities. Skin: Warm and dry.     Data Reviewed:   CBC: Recent Labs  Lab 01/30/22 0816 01/31/22 0838  WBC 5.7 7.2  HGB 11.4* 11.0*  HCT 35.4* 34.1*  MCV 91.9 91.2  PLT 230 230     Basic Metabolic Panel: Recent Labs  Lab 01/30/22 0816  01/31/22 0837  NA 129* 126*  K 5.6* 5.7*  CL 90* 90*  CO2 24 23  GLUCOSE 179* 169*  BUN 89* 104*  CREATININE 6.39* 6.96*  CALCIUM 9.0 8.6*     Liver Function Tests: No results for input(s): "AST", "ALT", "ALKPHOS", "BILITOT", "PROT", "ALBUMIN" in the last 168 hours.    Radiology Studies: No results found.    LOS: 159 days    Flora Lipps, MD [facility] Available via Epic secure chat 7am-7pm After these hours, please refer to coverage provider listed on [email or web address] 02/01/2022, 12:02 PM

## 2022-02-02 DIAGNOSIS — T148XXA Other injury of unspecified body region, initial encounter: Secondary | ICD-10-CM | POA: Diagnosis not present

## 2022-02-02 DIAGNOSIS — Z8673 Personal history of transient ischemic attack (TIA), and cerebral infarction without residual deficits: Secondary | ICD-10-CM | POA: Diagnosis not present

## 2022-02-02 DIAGNOSIS — M86252 Subacute osteomyelitis, left femur: Secondary | ICD-10-CM | POA: Diagnosis not present

## 2022-02-02 DIAGNOSIS — N186 End stage renal disease: Secondary | ICD-10-CM | POA: Diagnosis not present

## 2022-02-02 LAB — GLUCOSE, CAPILLARY
Glucose-Capillary: 151 mg/dL — ABNORMAL HIGH (ref 70–99)
Glucose-Capillary: 155 mg/dL — ABNORMAL HIGH (ref 70–99)
Glucose-Capillary: 151 mg/dL — ABNORMAL HIGH (ref 70–99)
Glucose-Capillary: 193 mg/dL — ABNORMAL HIGH (ref 70–99)

## 2022-02-02 MED ORDER — PENTAFLUOROPROP-TETRAFLUOROETH EX AERO: 1.0000 | INHALATION_SPRAY | CUTANEOUS | Status: DC | PRN

## 2022-02-02 MED ORDER — HEPARIN SODIUM (PORCINE) 1000 UNIT/ML DIALYSIS: 1000.0000 [IU] | INTRAMUSCULAR | Status: DC | PRN

## 2022-02-02 MED ORDER — CHLORHEXIDINE GLUCONATE CLOTH 2 % EX PADS: 6.0000 | MEDICATED_PAD | Freq: Every day | CUTANEOUS | Status: DC

## 2022-02-02 MED ORDER — ALTEPLASE 2 MG IJ SOLR: 2.0000 mg | Freq: Once | INTRAMUSCULAR | Status: DC | PRN

## 2022-02-02 NOTE — Progress Notes (Signed)
PROGRESS NOTE    [name]  LKG:[id number] DOB: 04-Aug-1965 DOA: 08/26/2021 PCP: [name], MD    Brief Narrative:   Patient is 56 year old female with history of hypertension, hyperlipidemia, diabetes mellitus type 2, end-stage renal disease on hemodialysis, status post left BKA for chronic calcaneus osteomyelitis on 01/12/2020, bipolar disorder, left distal femoral fracture after a fall on 07/19/2021 deemed  nonoperative, admission from 08/03/2021-08/23/2021 for left femoral osteomyelitis requiring partial resection of femur and rearrangements of bony fragments with subsequent antibiotic treatment presented to the hospital with left hip bleeding and was found to have subacute osteomyelitis and abscess of left femur.  Orthopedic/ID were consulted and patient was started on broad-spectrum antibiotics.  Underwent multiple I&D's by Dr. [name] along with local tissue rearrangement.  Patient was also seen by vascular surgery who did  fistula branch ligation.  She has completed 6-week course of daptomycin on 10/09/2021.  She has been unable to complete hemodialysis in chair and outpatient hemodialysis unit has been hard to find.  She is otherwise medically stable for discharge.   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  Active Problems:   ESRD (end stage renal disease) on dialysis ([place])   Type 2 diabetes mellitus with hyperlipidemia (HCC)   History of CVA (cerebrovascular accident)   Essential hypertension   Anxiety and depression   GASTROESOPHAGEAL REFLUX, NO ESOPHAGITIS   TOBACCO DEPENDENCE   Hyponatremia   Obesity (BMI 30.0-34.9)   Unilateral amputation of left foot (HCC)   Wound infection   History of CVA (cerebrovascular accident) without residual deficits   Hyperkalemia   Wound dehiscence, surgical, sequela   Subacute osteomyelitis of left femur with abscess Status post surgical intervention by Dr. [name] with multiple left hip  debridements.  Completed a 6-week course of daptomycin on 10/09/2021.  Continue muscle relaxant and oxycodone.  Re-imaging on 12/02/2021 of left knee showed healing fracture with callus.  Orthopedics Dr [name] has followed and is doing well.  No left hip restrictions at this time.  On fentanyl patch, oxycodone and Robaxin.  Chronic pain.   On fentanyl patch, oxycodone and Robaxin.  Continue amitriptyline.  Anterior chest wall pain.  Musculoskeletal.  Improved at this time.  Right thigh mild cellulitis Resolved   History of left intertrochanteric fracture Left hip hardware removed on 08/10/2021 prior to admission.  Physical therapy working to scoot and mobilize with wheelchair.  Will need at least 4 hours to be able to be considered for outpatient hemodialysis.  Encouraged physical therapy and participation.  End-stage renal disease on hemodialysis [facility] on board for hemodialysis needs.  Awaiting for outpatient hemodialysis.  Hyponatremia Sodium today at 130.  Asymptomatic  Borderline hyperkalemia.  Potassium of 4.9 on 01/31/2022. On Lokelma as needed.   Hyperphosphatemia continue sevelamer.  Anemia of chronic disease Latest hemoglobin  at 11.0.  Continue to monitor.  Diabetes mellitus type 2 with hyperglycemia Last hemoglobin A1c of 5.9 on 11/14/2021.  Continue mealtime sliding scale and long-acting insulin.  Closely monitor.   Essential hypertension Continue Coreg, hydralazine, Lasix.    History of unspecified CVA Hyperlipidemia Continue Lipitor.   GERD -Continue Protonix and Pepcid   Tobacco use Stable at this time.   Anxiety and depression Continue amitriptyline and Cymbalta.  Fungal rash left stump, on clotrimazole.    DVT prophylaxis: Place TED hose Start: 09/06/21 1507 SCDs Start: 08/31/21 1058   Code Status:     Code Status: Full Code  Disposition: Uncertain at this time  Status is: Inpatient  Remains inpatient appropriate because: Pending  disposition plan, pending tolerance with the dialysis chair.   Family Communication:  None at bedside.  Consultants:  Nephrology Vascular surgery Orthopedics Infectious disease  Procedures:  Multiple I&D's with tissue arrangement. Fistula branch ligation. Hemodialysis  Antimicrobials:  Ceftriaxone 10/15 - 10/18 Doxycycline 10/14 - 10/14 Cefazolin 7/1 - 7/1, 7/20 - 7/20 Daptomycin 6/29 - 8/11  Subjective: Today, patient was seen and examined at bedside.  Patient denies interval complaints.  Denies any nausea vomiting fever chills or overt pain.  Does have some chronic pain.   Objective: Vitals:   02/01/22 1629 02/01/22 2152 02/02/22 0622 02/02/22 0814  BP: (!) 161/58 (!) 159/50 (!) 145/55 (!) 168/54  Pulse: 67 69 73 62  Resp: 17 18 18 18   Temp: 98.4 F (36.9 C) 97.8 F (36.6 C) 98.1 F (36.7 C) 98.2 F (36.8 C)  TempSrc:  Oral Oral Oral  SpO2: 100% 98% 99% 99%  Weight:      Height:        Intake/Output Summary (Last 24 hours) at 02/02/2022 1031 Last data filed at 02/02/2022 0852 Gross per 24 hour  Intake 1080 ml  Output 50 ml  Net 1030 ml    Filed Weights   01/31/22 0820 01/31/22 1218 01/31/22 2114  Weight: 97.9 kg 93.1 kg 96.9 kg    Physical Examination: Body mass index is 30.65 kg/m.   General: Obese, alert awake and Communicative,  HENT:   No scleral pallor or icterus noted. Oral mucosa is moist.  Chest: Diminished breath sounds bilaterally.   CVS: S1 &S2 heard. No murmur.  Regular rate and rhythm. Abdomen: Soft, nontender, nondistended.  Bowel sounds are heard.   Extremities: Left below-knee amputation stump covered with stump covered, left hip with surgical healed scar.  Psych: Alert, awake and oriented, normal mood CNS:  No cranial nerve deficits.  Moves extremities. Skin: Warm and dry.     Data Reviewed:   CBC: Recent Labs  Lab 01/30/22 0816 01/31/22 0838  WBC 5.7 7.2  HGB 11.4* 11.0*  HCT 35.4* 34.1*  MCV 91.9 91.2  PLT 230 230      Basic Metabolic Panel: Recent Labs  Lab 01/30/22 0816 01/31/22 0837 02/01/22 1707  NA 129* 126* 130*  K 5.6* 5.7* 4.9  CL 90* 90* 92*  CO2 24 23 26   GLUCOSE 179* 169* 251*  BUN 89* 104* 70*  CREATININE 6.39* 6.96* 5.78*  CALCIUM 9.0 8.6* 8.9  PHOS  --   --  4.9*     Liver Function Tests: Recent Labs  Lab 02/01/22 1707  ALBUMIN 3.7      Radiology Studies: No results found.    LOS: 160 days    Flora Lipps, MD [facility] Available via Epic secure chat 7am-7pm After these hours, please refer to coverage provider listed on [email or web address] 02/02/2022, 10:31 AM

## 2022-02-02 NOTE — Plan of Care (Signed)
  Problem: Activity: Goal: Risk for activity intolerance will decrease Outcome: Progressing   Problem: Pain Managment: Goal: General experience of comfort will improve Outcome: Progressing   

## 2022-02-03 DIAGNOSIS — N186 End stage renal disease: Secondary | ICD-10-CM | POA: Diagnosis not present

## 2022-02-03 DIAGNOSIS — M86252 Subacute osteomyelitis, left femur: Secondary | ICD-10-CM | POA: Diagnosis not present

## 2022-02-03 DIAGNOSIS — T148XXA Other injury of unspecified body region, initial encounter: Secondary | ICD-10-CM | POA: Diagnosis not present

## 2022-02-03 DIAGNOSIS — Z8673 Personal history of transient ischemic attack (TIA), and cerebral infarction without residual deficits: Secondary | ICD-10-CM | POA: Diagnosis not present

## 2022-02-03 LAB — RENAL FUNCTION PANEL
Albumin: 3.6 g/dL (ref 3.5–5.0)
Anion gap: 15 (ref 5–15)
BUN: 107 mg/dL — ABNORMAL HIGH (ref 6–20)
CO2: 23 mmol/L (ref 22–32)
Calcium: 8.8 mg/dL — ABNORMAL LOW (ref 8.9–10.3)
Chloride: 90 mmol/L — ABNORMAL LOW (ref 98–111)
Creatinine, Ser: 7.35 mg/dL — ABNORMAL HIGH (ref 0.44–1.00)
GFR, Estimated: 6 mL/min — ABNORMAL LOW (ref >60)
Glucose, Bld: 216 mg/dL — ABNORMAL HIGH (ref 70–99)
Phosphorus: 6.4 mg/dL — ABNORMAL HIGH (ref 2.5–4.6)
Potassium: 5.3 mmol/L — ABNORMAL HIGH (ref 3.5–5.1)
Sodium: 128 mmol/L — ABNORMAL LOW (ref 135–145)

## 2022-02-03 LAB — CBC WITH DIFFERENTIAL/PLATELET
Abs Immature Granulocytes: 0.03 10*3/uL (ref 0.00–0.07)
Basophils Absolute: 0.1 10*3/uL (ref 0.0–0.1)
Basophils Relative: 1 %
Eosinophils Absolute: 0.2 10*3/uL (ref 0.0–0.5)
Eosinophils Relative: 3 %
HCT: 33.7 % — ABNORMAL LOW (ref 36.0–46.0)
Hemoglobin: 10.8 g/dL — ABNORMAL LOW (ref 12.0–15.0)
Immature Granulocytes: 0 %
Lymphocytes Relative: 25 %
Lymphs Abs: 1.8 10*3/uL (ref 0.7–4.0)
MCH: 29.5 pg (ref 26.0–34.0)
MCHC: 32 g/dL (ref 30.0–36.0)
MCV: 92.1 fL (ref 80.0–100.0)
Monocytes Absolute: 0.9 10*3/uL (ref 0.1–1.0)
Monocytes Relative: 13 %
Neutro Abs: 4.2 10*3/uL (ref 1.7–7.7)
Neutrophils Relative %: 58 %
Platelets: 234 10*3/uL (ref 150–400)
RBC: 3.66 MIL/uL — ABNORMAL LOW (ref 3.87–5.11)
RDW: 15.6 % — ABNORMAL HIGH (ref 11.5–15.5)
WBC: 7.2 10*3/uL (ref 4.0–10.5)
nRBC: 0 % (ref 0.0–0.2)

## 2022-02-03 LAB — GLUCOSE, CAPILLARY
Glucose-Capillary: 199 mg/dL — ABNORMAL HIGH (ref 70–99)
Glucose-Capillary: 148 mg/dL — ABNORMAL HIGH (ref 70–99)
Glucose-Capillary: 198 mg/dL — ABNORMAL HIGH (ref 70–99)
Glucose-Capillary: 165 mg/dL — ABNORMAL HIGH (ref 70–99)

## 2022-02-03 MED ORDER — HEPARIN SODIUM (PORCINE) 1000 UNIT/ML IJ SOLN
INTRAMUSCULAR | Status: AC
  Administered 2022-02-03: 2000 [IU] via INTRAVENOUS
  Filled 2022-02-03: qty 2

## 2022-02-03 MED ORDER — HEPARIN SODIUM (PORCINE) 1000 UNIT/ML IJ SOLN
INTRAMUSCULAR | Status: AC
  Administered 2022-02-03: 4000 [IU] via INTRAVENOUS
  Filled 2022-02-03: qty 4

## 2022-02-03 MED ORDER — HEPARIN SODIUM (PORCINE) 1000 UNIT/ML IJ SOLN: 2000.0000 [IU] | Freq: Once | INTRAMUSCULAR | Status: AC

## 2022-02-03 MED ORDER — ALOSETRON HCL 1 MG PO TABS
1.0000 mg | ORAL_TABLET | Freq: Two times a day (BID) | ORAL | Status: DC
  Administered 2022-02-03 – 2022-03-18 (×80): 1 mg via ORAL
  Filled 2022-02-03 (×95): qty 1

## 2022-02-03 MED ORDER — HEPARIN SODIUM (PORCINE) 1000 UNIT/ML IJ SOLN: 4000.0000 [IU] | Freq: Once | INTRAMUSCULAR | Status: AC

## 2022-02-03 NOTE — Progress Notes (Signed)
PT Cancellation Note  Patient Details Name: [name] MRN: [id number] DOB: March 15, 1965   Cancelled Treatment:    Reason Eval/Treat Not Completed: Patient at procedure or test/unavailable  Pt off unit for dialysis. Will follow-up for physical therapy session as time allows.  Candie Mile, PT, DPT Physical Therapist [facility]  02/03/2022, 9:04 AM

## 2022-02-03 NOTE — Progress Notes (Signed)
PROGRESS NOTE    [name]  NUU:[id number] DOB: 04-Dec-1965 DOA: 08/26/2021 PCP: [name], MD    Brief Narrative:   Patient is 56 year old female with history of hypertension, hyperlipidemia, diabetes mellitus type 2, end-stage renal disease on hemodialysis, status post left BKA for chronic calcaneus osteomyelitis on 01/12/2020, bipolar disorder, left distal femoral fracture after a fall on 07/19/2021 deemed  nonoperative, admission from 08/03/2021-08/23/2021 for left femoral osteomyelitis requiring partial resection of femur and rearrangements of bony fragments with subsequent antibiotic treatment presented to the hospital with left hip bleeding and was found to have subacute osteomyelitis and abscess of left femur.  Orthopedic/ID were consulted and patient was started on broad-spectrum antibiotics.  Patient underwent multiple I&D's by Dr. [name] along with local tissue rearrangement.  Patient was also seen by vascular surgery who did  fistula branch ligation.  Patient has completed 6-week course of daptomycin on 10/09/2021.  She has been unable to complete hemodialysis in chair and outpatient hemodialysis unit has been hard to find.  She is otherwise medically stable for discharge.   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  Active Problems:   ESRD (end stage renal disease) on dialysis ([place])   Type 2 diabetes mellitus with hyperlipidemia (HCC)   History of CVA (cerebrovascular accident)   Essential hypertension   Anxiety and depression   GASTROESOPHAGEAL REFLUX, NO ESOPHAGITIS   TOBACCO DEPENDENCE   Hyponatremia   Obesity (BMI 30.0-34.9)   Unilateral amputation of left foot (HCC)   Wound infection   History of CVA (cerebrovascular accident) without residual deficits   Hyperkalemia   Wound dehiscence, surgical, sequela   Subacute osteomyelitis of left femur with abscess Status post surgical intervention by Dr. [name] with multiple left hip  debridements.  Completed a 6-week course of daptomycin on 10/09/2021.   Re-imaging on 12/02/2021 of left knee showed healing fracture with callus.  Orthopedics Dr [name] has followed and is doing well.  No left hip restrictions at this time.  On fentanyl patch, oxycodone and Robaxin.  Continue fentanyl patch, oxycodone and Robaxin.  Chronic pain.   On fentanyl patch, oxycodone, Robaxin and amitriptyline.    Right thigh mild cellulitis Resolved   History of left intertrochanteric fracture Left hip hardware removed on 08/10/2021 prior to admission.  Physical therapy working to scoot and mobilize with wheelchair.  Will need to tolerate sitting for at least 4 hours to be able to be considered for outpatient hemodialysis.  I have continued to encourage her regarding physical therapy and participation.    End-stage renal disease on hemodialysis Nephrology on board for hemodialysis needs.  Awaiting for outpatient hemodialysis.  Hyponatremia Sodium today at 128.  Asymptomatic  Borderline hyperkalemia.  Potassium of 5.3 on 01/31/2022. On Lokelma as needed.   Hyperphosphatemia continue sevelamer.  Anemia of chronic disease Latest hemoglobin  at 10.8.  Continue to monitor.  Diabetes mellitus type 2 with hyperglycemia Last hemoglobin A1c of 5.9 on 11/14/2021.  Continue mealtime sliding scale and long-acting insulin.  Closely monitor.  Latest POC glucose of 148   Essential hypertension Continue Coreg, hydralazine, Lasix.    History of unspecified CVA Hyperlipidemia Continue Lipitor.   GERD -Continue Protonix and Pepcid   Tobacco use Stable at this time.   Anxiety and depression Continue amitriptyline and Cymbalta.    DVT prophylaxis: Place TED hose Start: 09/06/21 1507 SCDs Start: 08/31/21 1058   Code Status:     Code Status: Full Code  Disposition: Uncertain at this time, TOC involved.  Spoke with the patient at length and the team regarding barriers of discharge.  Status is:  Inpatient  Remains inpatient appropriate because: Pending disposition plan, pending tolerance with the dialysis chair.   Family Communication:  None at bedside.  Consultants:  Nephrology Vascular surgery Orthopedics Infectious disease  Procedures:  Multiple I&D's with tissue arrangement. Fistula branch ligation. Hemodialysis  Antimicrobials:  Ceftriaxone 10/15 - 10/18 Doxycycline 10/14 - 10/14 Cefazolin 7/1 - 7/1, 7/20 - 7/20 Daptomycin 6/29 - 8/11  Subjective: Today, patient was seen and examined at bedside.  Seen during hemodialysis.  Denies any nausea vomiting fever chills.  Denies any shortness of breath cough fever.  Objective: Vitals:   02/03/22 1132 02/03/22 1145 02/03/22 1154 02/03/22 1215  BP: (!) 152/65 (!) 151/57 (!) 145/59   Pulse: 64 66 63 63  Resp:   18   Temp:   98 F (36.7 C)   TempSrc:      SpO2:   98% 98%  Weight:   93.8 kg   Height:        Intake/Output Summary (Last 24 hours) at 02/03/2022 1428 Last data filed at 02/03/2022 1422 Gross per 24 hour  Intake 720 ml  Output 5100 ml  Net -4380 ml    Filed Weights   01/31/22 2114 02/03/22 0819 02/03/22 1154  Weight: 96.9 kg 98.3 kg 93.8 kg    Physical Examination: Body mass index is 29.67 kg/m.   General: Obese, alert awake and Communicative,  HENT:   No scleral pallor or icterus noted. Oral mucosa is moist.  Chest: Diminished breath sounds bilaterally.  No wheezes or crackles.  CVS: S1 &S2 heard. No murmur.  Regular rate and rhythm. Abdomen: Soft, nontender, nondistended.  Bowel sounds are heard.   Extremities: Left below-knee amputation, left hip with surgical healed scar.  psych: Alert, awake and Communicative. CNS:  No cranial nerve deficits.  Moves extremities. Skin: Warm and dry.  Left below-knee amputation  Data Reviewed:   CBC: Recent Labs  Lab 01/30/22 0816 01/31/22 0838 02/03/22 0825  WBC 5.7 7.2 7.2  NEUTROABS  --   --  4.2  HGB 11.4* 11.0* 10.8*  HCT 35.4* 34.1*  33.7*  MCV 91.9 91.2 92.1  PLT 230 230 234     Basic Metabolic Panel: Recent Labs  Lab 01/30/22 0816 01/31/22 0837 02/01/22 1707 02/03/22 0825  NA 129* 126* 130* 128*  K 5.6* 5.7* 4.9 5.3*  CL 90* 90* 92* 90*  CO2 24 23 26 23   GLUCOSE 179* 169* 251* 216*  BUN 89* 104* 70* 107*  CREATININE 6.39* 6.96* 5.78* 7.35*  CALCIUM 9.0 8.6* 8.9 8.8*  PHOS  --   --  4.9* 6.4*     Liver Function Tests: Recent Labs  Lab 02/01/22 1707 02/03/22 0825  ALBUMIN 3.7 3.6      Radiology Studies: No results found.    LOS: 161 days    Flora Lipps, MD [facility] Available via Epic secure chat 7am-7pm After these hours, please refer to coverage provider listed on [email or web address] 02/03/2022, 2:28 PM

## 2022-02-03 NOTE — Plan of Care (Signed)
  Problem: Activity: Goal: Risk for activity intolerance will decrease Outcome: Progressing   Problem: Pain Managment: Goal: General experience of comfort will improve Outcome: Progressing   

## 2022-02-03 NOTE — Progress Notes (Signed)
Received patient in bed to unit.  Alert and oriented.  Informed consent signed and in chart.    Patient tolerated well.  Transported back to the room  Alert, without acute distress.  Hand-off given to patient's nurse.   Access used: LUA AVF Access issues: none  Total UF removed: 4.5L Medication(s) given: none Post HD VS: BP: 145/59, pulse: 63, RR: 18, O2sat: 98 Post HD weight: 93.8 kg   [facility]

## 2022-02-03 NOTE — Progress Notes (Signed)
[name] Progress Note   Subjective:  Seen on HD - 4.5L UFG and tolerating. Tells me that she has been working with PT and things are going well. No CP/dyspnea.  Objective Vitals:   02/03/22 0538 02/03/22 0819 02/03/22 0826 02/03/22 0900  BP: (!) 175/59 (!) 160/59 (!) 190/63 (!) 171/61  Pulse: 63 60 (!) 58 63  Resp: 18 12 17 18   Temp: 98 F (36.7 C) 98.1 F (36.7 C)    TempSrc: Oral     SpO2: 98% 99% 98% 97%  Weight:  98.3 kg    Height:       Physical Exam General: Well appearing woman, NAD. Room air Heart: RRR; no murmur Lungs: CTAB; no rales Abdomen: soft Extremities: L BKA, 1+ stump edema and distal RLE edema Dialysis Access: LUE AVF + bruit  Additional Objective Labs: Basic Metabolic Panel: Recent Labs  Lab 01/31/22 0837 02/01/22 1707 02/03/22 0825  NA 126* 130* 128*  K 5.7* 4.9 5.3*  CL 90* 92* 90*  CO2 23 26 23   GLUCOSE 169* 251* 216*  BUN 104* 70* 107*  CREATININE 6.96* 5.78* 7.35*  CALCIUM 8.6* 8.9 8.8*  PHOS  --  4.9* 6.4*   Liver Function Tests: Recent Labs  Lab 02/01/22 1707 02/03/22 0825  ALBUMIN 3.7 3.6   CBC: Recent Labs  Lab 01/30/22 0816 01/31/22 0838 02/03/22 0825  WBC 5.7 7.2 7.2  NEUTROABS  --   --  4.2  HGB 11.4* 11.0* 10.8*  HCT 35.4* 34.1* 33.7*  MCV 91.9 91.2 92.1  PLT 230 230 234   Medications:  albumin human      (feeding supplement) PROSource Plus  30 mL Oral BID BM   acidophilus  1 capsule Oral Daily   alosetron  1 mg Oral BID   amitriptyline  50 mg Oral QHS   atorvastatin  40 mg Oral q1800   carvedilol  3.125 mg Oral BID   docusate sodium  200 mg Oral BID   DULoxetine  60 mg Oral Daily   famotidine  20 mg Oral Daily   fentaNYL  1 patch Transdermal Q72H   furosemide  80 mg Oral Daily   heparin sodium (porcine)       hydrALAZINE  50 mg Oral TID   insulin aspart  0-9 Units Subcutaneous TID WC   insulin aspart  6 Units Subcutaneous TID WC   insulin glargine-yfgn  10 Units Subcutaneous QHS    methocarbamol  500 mg Oral QID   multivitamin  1 tablet Oral QHS   pantoprazole  80 mg Oral Daily   polyethylene glycol  17 g Oral Daily   sevelamer carbonate  2,400 mg Oral TID WC   sodium zirconium cyclosilicate  10 g Oral Once per day on Sun Tue Thu Sat    Dialysis Orders: MWF at [place], 400/500, EDW 90kg, 2K/2Ca, LUE AVF - heparin 4000 unit IV bolus, no ESA   Assessment/Plan: 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 Completed 6 wk course of IV Daptomycin on 10/12/21. Per Dr. Jess Barters note 12/28/2021 "NO RESTRICTIONS L HIP".  Chronic L hip/residual limb pain: Hx fractures of L femoral neck (where hardware was removed) as well as distal femur Fx. Pain control per primary.  ESRD: Usual MWF schedule. Clots HD system without heparin, 4000 unit bolus and 2000 unit mid-run bolus. Refused HD d/t not liking the timing of it - managers aware and have discussed with patient. HD today.  Hyperkalemia: Stable on scheduled lokelma 4 days a week. HTN/volume: BP meds have been tapered down - currently on Coreg 3.125mg  BID only. Large fluids gains between HD which has been discussed extensively, Ongoing edema, UF prn. Anemia of ESRD: Hgb 9-10s stable. Last tsat 54%. Continue Aranesp 135mcg weekly.  Secondary HPTH: CorrCa ok, no VDRA. Last phos trending up, continue Renvela as binder. Nutrition: Alb low, continue protein supplements.  Renal diet w/fluid restrictions.   AVF dysfunction (resolved): S/p branch ligation 09/19/2021  Dr. Virl Cagey. AVF working well now. DM2: Insulin per primary Dispo: SNF previously recommended, but she declined. Not home HD candidate.  Dialyzed in recliner on 9/4 but signed off early due to pain, has declined to come in a recliner since then.  Strongly discussed on the importance of increasing her mobility as much as possible while she is in her room in addition to following her ongoing fluid restriction. Also reinforced the  importance of being in the chair for HD treatments to improve strength and mobility. Does not like to do PT after HD, will try to do HD in the afternoons so she can do PT in the AM. Per [facility]igator notes there are no clinics that will accept patient unless she will have HD in chair. Will continue to encourage use of chair for HD.    Veneta Penton, PA-C 02/03/2022, 9:39 AM  [employer]

## 2022-02-04 DIAGNOSIS — Z8673 Personal history of transient ischemic attack (TIA), and cerebral infarction without residual deficits: Secondary | ICD-10-CM | POA: Diagnosis not present

## 2022-02-04 DIAGNOSIS — T148XXA Other injury of unspecified body region, initial encounter: Secondary | ICD-10-CM | POA: Diagnosis not present

## 2022-02-04 DIAGNOSIS — M86252 Subacute osteomyelitis, left femur: Secondary | ICD-10-CM | POA: Diagnosis not present

## 2022-02-04 DIAGNOSIS — N186 End stage renal disease: Secondary | ICD-10-CM | POA: Diagnosis not present

## 2022-02-04 LAB — GLUCOSE, CAPILLARY
Glucose-Capillary: 143 mg/dL — ABNORMAL HIGH (ref 70–99)
Glucose-Capillary: 116 mg/dL — ABNORMAL HIGH (ref 70–99)
Glucose-Capillary: 120 mg/dL — ABNORMAL HIGH (ref 70–99)
Glucose-Capillary: 204 mg/dL — ABNORMAL HIGH (ref 70–99)

## 2022-02-04 NOTE — Progress Notes (Signed)
PROGRESS NOTE    [name]  YBO:[id number] DOB: 1965/08/07 DOA: 08/26/2021 PCP: [name], MD    Brief Narrative:   Patient is 56 year old female with history of hypertension, hyperlipidemia, diabetes mellitus type 2, end-stage renal disease on hemodialysis, status post left BKA for chronic calcaneus osteomyelitis on 01/12/2020, bipolar disorder, left distal femoral fracture after a fall on 07/19/2021 deemed  nonoperative, admission from 08/03/2021-08/23/2021 for left femoral osteomyelitis requiring partial resection of femur and rearrangements of bony fragments with subsequent antibiotic treatment presented to the hospital with left hip bleeding and was found to have subacute osteomyelitis and abscess of left femur.  Orthopedic/ID were consulted and patient was started on broad-spectrum antibiotics.  Patient underwent multiple I&D's by Dr. [name] along with local tissue rearrangement.  Patient was also seen by vascular surgery who did  fistula branch ligation.  Patient has completed 6-week course of daptomycin on 10/09/2021.  She has been unable to complete hemodialysis in chair and outpatient hemodialysis unit has been hard to find.  She is otherwise medically stable for discharge.   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  Active Problems:   ESRD (end stage renal disease) on dialysis ([place])   Type 2 diabetes mellitus with hyperlipidemia (HCC)   History of CVA (cerebrovascular accident)   Essential hypertension   Anxiety and depression   GASTROESOPHAGEAL REFLUX, NO ESOPHAGITIS   TOBACCO DEPENDENCE   Hyponatremia   Obesity (BMI 30.0-34.9)   Unilateral amputation of left foot (HCC)   Wound infection   History of CVA (cerebrovascular accident) without residual deficits   Hyperkalemia   Wound dehiscence, surgical, sequela   Subacute osteomyelitis of left femur with abscess Status post surgical intervention by Dr. [name] with multiple left hip  debridements.  Completed a 6-week course of daptomycin on 10/09/2021.   Re-imaging on 12/02/2021 of left knee showed healing fracture with callus.  Orthopedics Dr [name] has followed the patient during hospitalization and is doing well.  No left hip restrictions at this time.  On fentanyl patch, oxycodone and Robaxin.  Continue fentanyl patch, oxycodone and Robaxin.  Chronic pain.   On fentanyl patch, oxycodone, Robaxin and amitriptyline.    Right thigh mild cellulitis Resolved   History of left intertrochanteric fracture Left hip hardware removed on 08/10/2021 prior to admission.  Physical therapy working to scoot and mobilize with wheelchair.  Will need to tolerate sitting for at least 4 hours to be able to be considered for outpatient hemodialysis.  She has been encouraged to participate more with physical therapy..    End-stage renal disease on hemodialysis Nephrology on board for hemodialysis needs.  Awaiting for outpatient hemodialysis tolerance in a chair.Marland Kitchen  Hyponatremia Sodium today at 128.  Asymptomatic, we will continue to monitor BMP periodically.  Borderline hyperkalemia.  Latest potassium of 5.3 on 01/31/2022. On Lokelma as needed.   Hyperphosphatemia continue sevelamer.  Anemia of chronic disease Latest hemoglobin  at 10.8.  Continue to monitor.  Diabetes mellitus type 2 with hyperglycemia Last hemoglobin A1c of 5.9 on 11/14/2021.  Continue mealtime sliding scale and long-acting insulin.  Closely monitor.  Latest POC glucose of 116   Essential hypertension Continue Coreg, hydralazine, Lasix.  Latest blood pressure at 160/53  History of unspecified CVA Hyperlipidemia Continue Lipitor.   GERD -Continue Protonix and Pepcid   Tobacco use Stable at this time.   Anxiety and depression Continue amitriptyline and Cymbalta.    DVT prophylaxis: Place TED hose  Start: 09/06/21 1507 SCDs Start: 08/31/21 1058   Code Status:     Code Status: Full Code  Disposition:   Uncertain at this time, TOC involved.  Spoke at interdisciplinary meeting.  Status is: Inpatient  Remains inpatient appropriate because: Pending disposition plan, pending tolerance with the dialysis chair.  TOC on board.   Family Communication:  None at bedside.  Consultants:  Nephrology Vascular surgery Orthopedics Infectious disease  Procedures:  Multiple I&D's with tissue arrangement. Fistula branch ligation. Hemodialysis  Antimicrobials:  Ceftriaxone 10/15 - 10/18 Doxycycline 10/14 - 10/14 Cefazolin 7/1 - 7/1, 7/20 - 7/20 Daptomycin 6/29 - 8/11  Subjective: Today, patient was seen and examined at bedside.  Denies interval complaints.  States that she is trying her best to do physical therapy.  Wants to get stronger prior to going home.  Objective: Vitals:   02/03/22 1215 02/03/22 1711 02/03/22 2108 02/04/22 0925  BP:  (!) 163/64 (!) 144/60 (!) 160/53  Pulse: 63 68 76 65  Resp:  16 18 17   Temp:  98.4 F (36.9 C) 98.8 F (37.1 C) 98.5 F (36.9 C)  TempSrc:  Oral Oral Oral  SpO2: 98% 99% 98% 99%  Weight:      Height:        Intake/Output Summary (Last 24 hours) at 02/04/2022 1353 Last data filed at 02/04/2022 0848 Gross per 24 hour  Intake 720 ml  Output 200 ml  Net 520 ml    Filed Weights   01/31/22 2114 02/03/22 0819 02/03/22 1154  Weight: 96.9 kg 98.3 kg 93.8 kg    Physical Examination: Body mass index is 29.67 kg/m.   General: Obese, alert awake and Communicative, not in obvious distress,  HENT:   No scleral pallor or icterus noted. Oral mucosa is moist.  Chest: Diminished breath sound bilaterally.  No crackles or wheezes.   CVS: S1 &S2 heard. No murmur.  Regular rate and rhythm. Abdomen: Soft, nontender, nondistended.  Bowel sounds are heard.   Extremities: Left below knee amputation, left hip with healed surgical scar.   Psych: Alert, awake and Communicative. CNS:  No cranial nerve deficits.  Moves extremities. Skin: Warm and dry.  Left  below-knee amputation.  Data Reviewed:   CBC: Recent Labs  Lab 01/30/22 0816 01/31/22 0838 02/03/22 0825  WBC 5.7 7.2 7.2  NEUTROABS  --   --  4.2  HGB 11.4* 11.0* 10.8*  HCT 35.4* 34.1* 33.7*  MCV 91.9 91.2 92.1  PLT 230 230 234     Basic Metabolic Panel: Recent Labs  Lab 01/30/22 0816 01/31/22 0837 02/01/22 1707 02/03/22 0825  NA 129* 126* 130* 128*  K 5.6* 5.7* 4.9 5.3*  CL 90* 90* 92* 90*  CO2 24 23 26 23   GLUCOSE 179* 169* 251* 216*  BUN 89* 104* 70* 107*  CREATININE 6.39* 6.96* 5.78* 7.35*  CALCIUM 9.0 8.6* 8.9 8.8*  PHOS  --   --  4.9* 6.4*     Liver Function Tests: Recent Labs  Lab 02/01/22 1707 02/03/22 0825  ALBUMIN 3.7 3.6      Radiology Studies: No results found.    LOS: 162 days    Flora Lipps, MD [facility] Available via Epic secure chat 7am-7pm After these hours, please refer to coverage provider listed on [email or web address] 02/04/2022, 1:53 PM

## 2022-02-04 NOTE — Progress Notes (Signed)
[facility] Progress Note   Subjective:  Seen in room - s/p HD yesterday with 4L off. No issues today. No CP/dyspnea.  Objective Vitals:   02/03/22 1215 02/03/22 1711 02/03/22 2108 02/04/22 0925  BP:  (!) 163/64 (!) 144/60 (!) 160/53  Pulse: 63 68 76 65  Resp:  16 18 17   Temp:  98.4 F (36.9 C) 98.8 F (37.1 C) 98.5 F (36.9 C)  TempSrc:  Oral Oral Oral  SpO2: 98% 99% 98% 99%  Weight:      Height:       Physical Exam General: Well appearing woman, NAD. Room air Heart: RRR; no murmur Lungs: CTAB; no rales Abdomen: soft Extremities: L BKA, 1+ stump edema and distal RLE edema Dialysis Access: LUE AVF + bruit  Additional Objective Labs: Basic Metabolic Panel: Recent Labs  Lab 01/31/22 0837 02/01/22 1707 02/03/22 0825  NA 126* 130* 128*  K 5.7* 4.9 5.3*  CL 90* 92* 90*  CO2 23 26 23   GLUCOSE 169* 251* 216*  BUN 104* 70* 107*  CREATININE 6.96* 5.78* 7.35*  CALCIUM 8.6* 8.9 8.8*  PHOS  --  4.9* 6.4*   Liver Function Tests: Recent Labs  Lab 02/01/22 1707 02/03/22 0825  ALBUMIN 3.7 3.6   CBC: Recent Labs  Lab 01/30/22 0816 01/31/22 0838 02/03/22 0825  WBC 5.7 7.2 7.2  NEUTROABS  --   --  4.2  HGB 11.4* 11.0* 10.8*  HCT 35.4* 34.1* 33.7*  MCV 91.9 91.2 92.1  PLT 230 230 234   Medications:  albumin human      (feeding supplement) PROSource Plus  30 mL Oral BID BM   acidophilus  1 capsule Oral Daily   alosetron  1 mg Oral BID   amitriptyline  50 mg Oral QHS   atorvastatin  40 mg Oral q1800   carvedilol  3.125 mg Oral BID   docusate sodium  200 mg Oral BID   DULoxetine  60 mg Oral Daily   famotidine  20 mg Oral Daily   fentaNYL  1 patch Transdermal Q72H   furosemide  80 mg Oral Daily   hydrALAZINE  50 mg Oral TID   insulin aspart  0-9 Units Subcutaneous TID WC   insulin aspart  6 Units Subcutaneous TID WC   insulin glargine-yfgn  10 Units Subcutaneous QHS   methocarbamol  500 mg Oral QID   multivitamin  1 tablet Oral QHS    pantoprazole  80 mg Oral Daily   polyethylene glycol  17 g Oral Daily   sevelamer carbonate  2,400 mg Oral TID WC   sodium zirconium cyclosilicate  10 g Oral Once per day on Sun Tue Thu Sat    Dialysis Orders: MWF at [facility], 400/500, EDW 90kg, 2K/2Ca, LUE AVF - heparin 4000 unit IV bolus, no ESA   Assessment/Plan: 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 Completed 6 wk course of IV Daptomycin on 10/12/21. Per Dr. Jess Barters note 12/28/2021 "NO RESTRICTIONS L HIP".  Chronic L hip/residual limb pain: Hx fractures of L femoral neck (where hardware was removed) as well as distal femur Fx. Pain control per primary.  ESRD: Usual MWF schedule. Clots HD system without heparin, 4000 unit bolus and 2000 unit mid-run bolus. Refused HD last week d/t not liking the timing of it - managers aware and have discussed with patient. HD tomorrow. Hyperkalemia: Stable on scheduled lokelma 4 days a week. HTN/volume: BP meds have been tapered down -  currently on Coreg 3.125mg  BID only. Large fluids gains between HD which has been discussed extensively, Ongoing edema, UF prn. Anemia of ESRD: Hgb 9-10s stable. Last tsat 54%. Continue Aranesp 168mcg weekly.  Secondary HPTH: CorrCa ok, no VDRA. Last phos trending up, continue Renvela as binder. Nutrition: Alb low, continue protein supplements.  Renal diet w/fluid restrictions.   AVF dysfunction (resolved): S/p branch ligation 09/19/2021  Dr. Virl Cagey. AVF working well now. DM2: Insulin per primary Dispo: SNF previously recommended, but she declined. Not home HD candidate.  Dialyzed in recliner on 9/4 but signed off early due to pain, has declined to come in a recliner since then.  Strongly discussed on the importance of increasing her mobility as much as possible while she is in her room in addition to following her ongoing fluid restriction. Also reinforced the importance of being in the chair for HD treatments to improve  strength and mobility. Does not like to do PT after HD, will try to do HD in the afternoons so she can do PT in the AM. Per Renal Navigator notes there are no clinics that will accept patient unless she will have HD in chair. Will continue to encourage use of chair for HD.  Veneta Penton, PA-C 02/04/2022, 10:19 AM  [employer]

## 2022-02-04 NOTE — Progress Notes (Signed)
Physical Therapy Treatment Patient Details Name: [name] MRN: [id number] DOB: 07-31-1965 Today's Date: 02/04/2022   History of Present Illness 56 y.o. female presented to ED 6/26 from dialysis with increased bloody drainage from L hip wound. 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 s/p hardware removal of left hip with partial resection of tuft tissue and femure that were nonviable on 08/10/21 Discharged home 6/23. underwent L hip debridement and wound vac placement 6/28; +Left intertrochanteric non-union,  Fracture of left distal femur  PMH: hypertension, hyperlipidemia, ESRD on HD MWF, history of left BKA in 2021, depression/anxiety, stroke, tobacco use, T2DM,  insomnia, chronic pain syndrome,    PT Comments    Progressed patient with some weight bearing activities through RLE today, using Stedy to pull self up (as able,) clearing buttocks but reports pain in Rt foot with WB. Too fatigued to practice sliding board. Will not wear shrinker or sleeve, only agreeable to prosthetic sock now. Feels shrinker causing skin irritation.Further education for constant positioning for LLE to prevent full contracture from developing as this limb is now very tight.  LE exercises, encouraged OOB as much as possible. Patient will continue to benefit from skilled physical therapy services to further improve independence with functional mobility.   Recommendations for follow up therapy are one component of a multi-disciplinary discharge planning process, led by the attending physician.  Recommendations may be updated based on patient status, additional functional criteria and insurance authorization.  Follow Up Recommendations  Skilled nursing-short term rehab (<3 hours/day) Can patient physically be transported by private vehicle: No   Assistance Recommended at Discharge Intermittent Supervision/Assistance  Patient can return home with the following A lot of help with  bathing/dressing/bathroom;Assistance with cooking/housework;Assist for transportation;Two people to help with walking and/or transfers;Help with stairs or ramp for entrance   Equipment Recommendations  Other (comment) (Sliding board, drop-arm BSC; needs a wheelchair cushion; would need medical [name] Lei transport for HD)    Recommendations for Other Services OT consult;Speech consult     Precautions / Restrictions Precautions Precautions: Fall;Other (comment) Precaution Comments: L BKA (baseline) Restrictions Weight Bearing Restrictions: No LLE Weight Bearing: Weight bearing as tolerated Other Position/Activity Restrictions: L distal femur fx; per Dr [name] 8/10 pt can full WB; Wear liner during the day and shrinker overnight     Mobility  Bed Mobility Overal bed mobility: Needs Assistance Bed Mobility: Supine to Sit, Sit to Supine     Supine to sit: Min guard, HOB elevated Sit to supine: Min guard   General bed mobility comments: Min guard for bed mobility, effortful to rise to EOB with HOB elevated using rail as able with VC for technique. Brings LEs to EOB without assist. Some trunk instability initially. Good control returning back to bed with guard for safety only. Rolled independently and able to reposition by scooting up in bed with bed flat.    Transfers Overall transfer level: Needs assistance Equipment used: Ambulation equipment used Transfers: Sit to/from Stand Sit to Stand: From elevated surface, Total assist (with stedy)           General transfer comment: Patient eager to try standing today. Tried stedy use multiple times with ability to rise about halfway up from elevated bed surface. Certainly better than last visit with RW. Good UE strength however reported burning pain through RLE on final attempt of 5 and declined to try further.    Ambulation/Gait  General Gait Details: attempted to "hop" backwards but unable to offload RLE via UEs on RW;  attempted several times   Stairs             Wheelchair Mobility    Modified Rankin (Stroke Patients Only)       Balance Overall balance assessment: Needs assistance Sitting-balance support: Feet supported Sitting balance-Leahy Scale: Fair (approaching Good) Sitting balance - Comments: Practiced extensive seated balance activities weight-shifting and scooting on bed - focusing on improved ability to self place sliding board for greater independence.   Standing balance support: Bilateral upper extremity supported, During functional activity, Reliant on assistive device for balance Standing balance-Leahy Scale: Zero Standing balance comment: Able to clear buttocks from bed today with Stedy but not stand fully upright.                            Cognition Arousal/Alertness: Awake/alert Behavior During Therapy: WFL for tasks assessed/performed Overall Cognitive Status: Impaired/Different from baseline Area of Impairment: Safety/judgement, Awareness                     Memory: Decreased short-term memory   Safety/Judgement: Decreased awareness of deficits, Decreased awareness of safety Awareness: Emergent   General Comments: Refusing to wear liner during the day, now refusing shrinker. Agreeable to prosthetic sock only        Exercises General Exercises - Lower Extremity Ankle Circles/Pumps: AROM, Right, 20 reps Long Arc Quad: AROM, Right, 20 reps, Seated Toe Raises: Strengthening, 15 reps, Limitations Heel Raises: Strengthening, Right, 15 reps, Supine Amputee Exercises Knee Extension:  (knee extension stretching) Other Exercises Other Exercises: Reviewed Lt hip internal rotation exercise, adding knee extension with towel and press, hip adduction periodically to perform throughout the day.    General Comments General comments (skin integrity, edema, etc.): Further education (again) for appropriate positioning of Lt leg due to developing  contracture. Pt was in bed without positoning devices that have been made. Also declining use of shrinker now because of skin irritation. agrees to wear prosthetic sock.      Pertinent Vitals/Pain Pain Assessment Pain Assessment: Faces Faces Pain Scale: Hurts even more Pain Location: back pain Pain Descriptors / Indicators: Discomfort, Guarding, Dull Pain Intervention(s): Monitored during session    Home Living                          Prior Function            PT Goals (current goals can now be found in the care plan section) Acute Rehab PT Goals Patient Stated Goal: Stated she wants to be able to be home and walk by Christmas PT Goal Formulation: With patient Time For Goal Achievement: 02/13/22 Potential to Achieve Goals: Fair Progress towards PT goals: Progressing toward goals    Frequency    Min 2X/week      PT Plan Current plan remains appropriate    Co-evaluation              AM-PAC PT "6 Clicks" Mobility   Outcome Measure  Help needed turning from your back to your side while in a flat bed without using bedrails?: None Help needed moving from lying on your back to sitting on the side of a flat bed without using bedrails?: A Little Help needed moving to and from a bed to a chair (including a wheelchair)?: A Little Help needed standing up  from a chair using your arms (e.g., wheelchair or bedside chair)?: Total Help needed to walk in hospital room?: Total Help needed climbing 3-5 steps with a railing? : Total 6 Click Score: 13    End of Session Equipment Utilized During Treatment: Gait belt Charlaine Dalton) Activity Tolerance: Patient tolerated treatment well Patient left: in bed;with call bell/phone within reach;with bed alarm set Nurse Communication: Mobility status PT Visit Diagnosis: Other abnormalities of gait and mobility (R26.89);Muscle weakness (generalized) (M62.81);History of falling (Z91.81);Pain Pain - Right/Left: Left Pain - part of  body: Leg     Time: 6812-7517 PT Time Calculation (min) (ACUTE ONLY): 40 min  Charges:  $Therapeutic Exercise: 8-22 mins $Therapeutic Activity: 23-37 mins                     Candie Mile, PT, DPT Physical Therapist Acute [facility] [place]    [name] 02/04/2022, 4:05 PM

## 2022-02-05 DIAGNOSIS — M86252 Subacute osteomyelitis, left femur: Secondary | ICD-10-CM | POA: Diagnosis not present

## 2022-02-05 LAB — CBC
HCT: 33.5 % — ABNORMAL LOW (ref 36.0–46.0)
Hemoglobin: 11.5 g/dL — ABNORMAL LOW (ref 12.0–15.0)
MCH: 31 pg (ref 26.0–34.0)
MCHC: 34.3 g/dL (ref 30.0–36.0)
MCV: 90.3 fL (ref 80.0–100.0)
Platelets: 237 10*3/uL (ref 150–400)
RBC: 3.71 MIL/uL — ABNORMAL LOW (ref 3.87–5.11)
RDW: 15 % (ref 11.5–15.5)
WBC: 8 10*3/uL (ref 4.0–10.5)
nRBC: 0 % (ref 0.0–0.2)

## 2022-02-05 LAB — RENAL FUNCTION PANEL
Albumin: 3.6 g/dL (ref 3.5–5.0)
Anion gap: 14 (ref 5–15)
BUN: 104 mg/dL — ABNORMAL HIGH (ref 6–20)
CO2: 24 mmol/L (ref 22–32)
Calcium: 8.7 mg/dL — ABNORMAL LOW (ref 8.9–10.3)
Chloride: 88 mmol/L — ABNORMAL LOW (ref 98–111)
Creatinine, Ser: 6.85 mg/dL — ABNORMAL HIGH (ref 0.44–1.00)
GFR, Estimated: 7 mL/min — ABNORMAL LOW (ref >60)
Glucose, Bld: 188 mg/dL — ABNORMAL HIGH (ref 70–99)
Phosphorus: 6.4 mg/dL — ABNORMAL HIGH (ref 2.5–4.6)
Potassium: 5 mmol/L (ref 3.5–5.1)
Sodium: 126 mmol/L — ABNORMAL LOW (ref 135–145)

## 2022-02-05 LAB — GLUCOSE, CAPILLARY
Glucose-Capillary: 137 mg/dL — ABNORMAL HIGH (ref 70–99)
Glucose-Capillary: 153 mg/dL — ABNORMAL HIGH (ref 70–99)
Glucose-Capillary: 253 mg/dL — ABNORMAL HIGH (ref 70–99)
Glucose-Capillary: 256 mg/dL — ABNORMAL HIGH (ref 70–99)

## 2022-02-05 MED ORDER — PENTAFLUOROPROP-TETRAFLUOROETH EX AERO
INHALATION_SPRAY | CUTANEOUS | Status: AC
  Filled 2022-02-05: qty 30

## 2022-02-05 MED ORDER — HEPARIN SODIUM (PORCINE) 1000 UNIT/ML IJ SOLN
INTRAMUSCULAR | Status: AC
  Filled 2022-02-05: qty 5

## 2022-02-05 MED ORDER — HEPARIN SODIUM (PORCINE) 1000 UNIT/ML DIALYSIS: 5000.0000 [IU] | Freq: Once | INTRAMUSCULAR | Status: DC

## 2022-02-05 NOTE — Progress Notes (Addendum)
Received patient in bed to unit.  Alert and oriented.  Informed consent signed and in chart.   Treatment initiated: 0923 Treatment completed: 1209  Patient b/p did drop towards the end of tx. Pt opted to come off of tx early. Vs did rebound. Transported back to the room  Alert, without acute distress.  Hand-off given to patient's nurse.   Access used: AVF Access issues: none  Total UF removed: 3.5L Medication(s) given: none Post HD VS: 97.8,,153/56,99%15,64 Post HD weight: 92.4kg   [facility]

## 2022-02-05 NOTE — Progress Notes (Signed)
OT Cancellation Note  Patient Details Name: [name] MRN: [id number] DOB: February 23, 1966   Cancelled Treatment:    Reason Eval/Treat Not Completed: Patient at procedure or test/ unavailable (Patient off unit in HD. Will attempt this afternoon as schedule permits) [name], [place]  Office [place] 02/05/2022, 9:11 AM

## 2022-02-05 NOTE — Progress Notes (Signed)
PROGRESS NOTE    Donna LEDEE  FUX:[id number] DOB: 10-Jan-1966 DOA: 08/26/2021 PCP: [name], MD   Brief Narrative:  This 56 year old female with PMH significant of hypertension, hyperlipidemia, diabetes mellitus type 2, end-stage renal disease on hemodialysis, status post left BKA for chronic calcaneus osteomyelitis on 01/12/2020, bipolar disorder, left distal femoral fracture after a fall on 07/19/2021 deemed  nonoperative, admission from 08/03/2021-08/23/2021 for left femoral osteomyelitis requiring partial resection of femur and rearrangements of bony fragments with subsequent antibiotic treatment presented to the hospital with left hip bleeding and was found to have subacute osteomyelitis and abscess of left femur.  Orthopedic/ID were consulted and patient was started on broad-spectrum antibiotics.  Patient underwent multiple I&D's by Dr. [name] along with local tissue rearrangement.  Patient was also seen by vascular surgery who did fistula branch ligation.  Patient has completed 6-week course of daptomycin on 10/09/2021.  She has been unable to complete hemodialysis in chair and outpatient hemodialysis unit has been hard to find.  She is otherwise medically stable for discharge.   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  Active Problems:   ESRD (end stage renal disease) on dialysis ([place])   Type 2 diabetes mellitus with hyperlipidemia (HCC)   History of CVA (cerebrovascular accident)   Essential hypertension   Anxiety and depression   GASTROESOPHAGEAL REFLUX, NO ESOPHAGITIS   TOBACCO DEPENDENCE   Hyponatremia   Obesity (BMI 30.0-34.9)   Unilateral amputation of left foot (HCC)   Wound infection   History of CVA (cerebrovascular accident) without residual deficits   Hyperkalemia   Wound dehiscence, surgical, sequela  Subacute osteomyelitis of left femur with abscess: Status post surgical intervention by Dr. [name] with multiple left hip  debridements.   She has completed a 6-week course of daptomycin on 10/09/2021.    Re-imaging on 12/02/2021 of left knee showed healing fracture with callus.   Orthopedics Dr [name] has followed the patient during hospitalization and is doing well.   No left hip restrictions at this time.  Continue fentanyl patch, oxycodone and Robaxin   Chronic pain syndrome: Continue  fentanyl patch, oxycodone, Robaxin and amitriptyline.     Right thigh cellulitis: Treated with antibiotics and resolved.   History of left intertrochanteric fracture: Left hip hardware removed on 08/10/2021 prior to admission.   Physical therapy working to scoot and mobilize with wheelchair.   She will need to tolerate sitting for at least 4 hours to be able to be considered for outpatient hemodialysis.   She has been encouraged to participate more with physical therapy..     End-stage renal disease on hemodialysis: Nephrology on board for hemodialysis needs.   Awaiting for outpatient hemodialysis tolerance in a chair.Marland Kitchen   Hyponatremia: Sodium today at 126.  Remains asymptomatic. Continue to monitor BMP periodically   Borderline hyperkalemia.   Latest potassium 5.0 on 02/05/2022.   Continue Lokelma as needed.   Hyperphosphatemia: continue sevelamer.   Anemia of chronic disease: Latest hemoglobin  at 11.5.  Continue to monitor.   Diabetes mellitus type 2 with hyperglycemia: Last hemoglobin A1c of 5.9 on 11/14/2021.   Continue mealtime sliding scale and long-acting insulin.   Closely monitor.  Latest POC glucose of 116   Essential hypertension: Continue Coreg, hydralazine, Lasix.   Blood pressure reasonably controlled.   History of unspecified CVA Hyperlipidemia Continue Lipitor.   GERD -Continue Protonix and Pepcid   Tobacco use Stable at this time.  Anxiety and depression Continue amitriptyline and Cymbalta.   DVT prophylaxis: SCDs Code Status: Full code. Family Communication: No family at bed  side. Disposition Plan:  Status is: Inpatient Remains inpatient appropriate because: Uncertain at this time.  TOC involved.  Pending disposition , Pending tolerance of dialysis in chair.    Consultants:  Nephrology Vascular surgery Orthopedic Infectious disease  Procedures:  Multiple I&D's with tissue arrangement. Fistula branch ligation. Hemodialysis.  Antimicrobials:  Ceftriaxone 10/15 - 10/18 Doxycycline 10/14 - 10/14 Cefazolin 7/1 - 7/1, 7/20 - 7/20 Daptomycin 6/29 - 8/11  Subjective: Patient was seen and examined at hemodialysis suite. She denies any specific complaints.   She states she is trying to do her best to do physical therapy. She wants to make sure that she is  stronger enough before going home.  Objective: Vitals:   02/05/22 1022 02/05/22 1052 02/05/22 1157 02/05/22 1209  BP: (!) 176/65 (!) 173/66 (!) 87/56   Pulse: 68 70 62   Resp: 16 12 11    Temp:    97.8 F (36.6 C)  TempSrc:      SpO2:   100%   Weight:      Height:        Intake/Output Summary (Last 24 hours) at 02/05/2022 1313 Last data filed at 02/05/2022 1209 Gross per 24 hour  Intake 360 ml  Output 3500 ml  Net -3140 ml   Filed Weights   02/04/22 2056 02/05/22 0843 02/05/22 0850  Weight: 101.2 kg 95.9 kg 95.9 kg    Examination:  General exam: Appears comfortable, not in any obvious distress.  Deconditioned. Respiratory system: CTA bilaterally, respiratory effort normal, no accessory muscle use, RR 12 Cardiovascular system: S1 & S2 heard, regular rate and rhythm, no murmur. Gastrointestinal system: Abdomen is soft, non tender, non distended, BS+ Central nervous system: Alert and oriented x 3. No focal neurological deficits. Extremities: Left below-knee amputation.  Left hip with healed surgical scar. Skin: No rashes, lesions or ulcers Psychiatry: Judgement and insight appear normal. Mood & affect appropriate.     Data Reviewed: I have personally reviewed following labs and imaging  studies  CBC: Recent Labs  Lab 01/30/22 0816 01/31/22 0838 02/03/22 0825 02/05/22 0856  WBC 5.7 7.2 7.2 8.0  NEUTROABS  --   --  4.2  --   HGB 11.4* 11.0* 10.8* 11.5*  HCT 35.4* 34.1* 33.7* 33.5*  MCV 91.9 91.2 92.1 90.3  PLT 230 230 234 539   Basic Metabolic Panel: Recent Labs  Lab 01/30/22 0816 01/31/22 0837 02/01/22 1707 02/03/22 0825 02/05/22 0856  NA 129* 126* 130* 128* 126*  K 5.6* 5.7* 4.9 5.3* 5.0  CL 90* 90* 92* 90* 88*  CO2 24 23 26 23 24   GLUCOSE 179* 169* 251* 216* 188*  BUN 89* 104* 70* 107* 104*  CREATININE 6.39* 6.96* 5.78* 7.35* 6.85*  CALCIUM 9.0 8.6* 8.9 8.8* 8.7*  PHOS  --   --  4.9* 6.4* 6.4*   GFR: Estimated Creatinine Clearance: 11.8 mL/min (A) (by C-G formula based on SCr of 6.85 mg/dL (H)). Liver Function Tests: Recent Labs  Lab 02/01/22 1707 02/03/22 0825 02/05/22 0856  ALBUMIN 3.7 3.6 3.6   No results for input(s): "LIPASE", "AMYLASE" in the last 168 hours. No results for input(s): "AMMONIA" in the last 168 hours. Coagulation Profile: No results for input(s): "INR", "PROTIME" in the last 168 hours. Cardiac Enzymes: No results for input(s): "CKTOTAL", "CKMB", "CKMBINDEX", "TROPONINI" in the last 168 hours. BNP (last 3 results)  No results for input(s): "PROBNP" in the last 8760 hours. HbA1C: No results for input(s): "HGBA1C" in the last 72 hours. CBG: Recent Labs  Lab 02/04/22 0717 02/04/22 1141 02/04/22 1544 02/04/22 2058 02/05/22 0745  GLUCAP 143* 116* 120* 204* 137*   Lipid Profile: No results for input(s): "CHOL", "HDL", "LDLCALC", "TRIG", "CHOLHDL", "LDLDIRECT" in the last 72 hours. Thyroid Function Tests: No results for input(s): "TSH", "T4TOTAL", "FREET4", "T3FREE", "THYROIDAB" in the last 72 hours. Anemia Panel: No results for input(s): "VITAMINB12", "FOLATE", "FERRITIN", "TIBC", "IRON", "RETICCTPCT" in the last 72 hours. Sepsis Labs: No results for input(s): "PROCALCITON", "LATICACIDVEN" in the last 168  hours.  No results found for this or any previous visit (from the past 240 hour(s)).  Radiology Studies: No results found.  Scheduled Meds:  (feeding supplement) PROSource Plus  30 mL Oral BID BM   acidophilus  1 capsule Oral Daily   alosetron  1 mg Oral BID   amitriptyline  50 mg Oral QHS   atorvastatin  40 mg Oral q1800   carvedilol  3.125 mg Oral BID   docusate sodium  200 mg Oral BID   DULoxetine  60 mg Oral Daily   famotidine  20 mg Oral Daily   fentaNYL  1 patch Transdermal Q72H   furosemide  80 mg Oral Daily   heparin sodium (porcine)       hydrALAZINE  50 mg Oral TID   insulin aspart  0-9 Units Subcutaneous TID WC   insulin aspart  6 Units Subcutaneous TID WC   insulin glargine-yfgn  10 Units Subcutaneous QHS   methocarbamol  500 mg Oral QID   multivitamin  1 tablet Oral QHS   pantoprazole  80 mg Oral Daily   pentafluoroprop-tetrafluoroeth       polyethylene glycol  17 g Oral Daily   sevelamer carbonate  2,400 mg Oral TID WC   sodium zirconium cyclosilicate  10 g Oral Once per day on Sun Tue Thu Sat   Continuous Infusions:  albumin human       LOS: 163 days    Time spent: 35 mins    Idy Rawling, MD [facility]   If 7PM-7AM, please contact night-coverage

## 2022-02-05 NOTE — Progress Notes (Signed)
UF turned off due to bp dropping and pt cramping. Nurse aware

## 2022-02-05 NOTE — Progress Notes (Signed)
Mobility Specialist Progress Note:   02/05/22 1600  Mobility  Activity  (Daily stretches +HEP)  Range of Motion/Exercises Active;Passive;Left leg  Activity Response Tolerated well  Mobility Referral Yes  $Mobility charge 1 Mobility   Pt agreeable to mobility session. Performed daily stretches and exercises. Declined further mobility d/t BP drop in HD. Left pt in bed, all needs met.   Nelta Numbers Mobility Specialist Please contact via SecureChat or  Rehab office at [phone]

## 2022-02-05 NOTE — Progress Notes (Signed)
[facility] Progress Note   Subjective:  Seen on HD - 5L UFG and tolerating. No new symptoms.  Objective Vitals:   02/05/22 0850 02/05/22 0852 02/05/22 0922 02/05/22 0952  BP: (!) 146/60 (!) 156/57 (!) 158/62 (!) 152/68  Pulse: 61 61 63 64  Resp: 15 17 15 13   Temp: 98.1 F (36.7 C)     TempSrc:      SpO2: 98%     Weight: 95.9 kg     Height:       Physical Exam General: Well appearing woman, NAD. Room air Heart: RRR; no murmur Lungs: CTAB; no rales Abdomen: soft Extremities: L BKA, 1+ stump edema and distal RLE edema Dialysis Access: LUE AVF + bruit  Additional Objective Labs: Basic Metabolic Panel: Recent Labs  Lab 02/01/22 1707 02/03/22 0825 02/05/22 0856  NA 130* 128* 126*  K 4.9 5.3* 5.0  CL 92* 90* 88*  CO2 26 23 24   GLUCOSE 251* 216* 188*  BUN 70* 107* 104*  CREATININE 5.78* 7.35* 6.85*  CALCIUM 8.9 8.8* 8.7*  PHOS 4.9* 6.4* 6.4*   Liver Function Tests: Recent Labs  Lab 02/01/22 1707 02/03/22 0825 02/05/22 0856  ALBUMIN 3.7 3.6 3.6   CBC: Recent Labs  Lab 01/30/22 0816 01/31/22 0838 02/03/22 0825 02/05/22 0856  WBC 5.7 7.2 7.2 8.0  NEUTROABS  --   --  4.2  --   HGB 11.4* 11.0* 10.8* 11.5*  HCT 35.4* 34.1* 33.7* 33.5*  MCV 91.9 91.2 92.1 90.3  PLT 230 230 234 237   Medications:  albumin human      (feeding supplement) PROSource Plus  30 mL Oral BID BM   acidophilus  1 capsule Oral Daily   alosetron  1 mg Oral BID   amitriptyline  50 mg Oral QHS   atorvastatin  40 mg Oral q1800   carvedilol  3.125 mg Oral BID   docusate sodium  200 mg Oral BID   DULoxetine  60 mg Oral Daily   famotidine  20 mg Oral Daily   fentaNYL  1 patch Transdermal Q72H   furosemide  80 mg Oral Daily   heparin  5,000 Units Dialysis Once in dialysis   heparin sodium (porcine)       hydrALAZINE  50 mg Oral TID   insulin aspart  0-9 Units Subcutaneous TID WC   insulin aspart  6 Units Subcutaneous TID WC   insulin glargine-yfgn  10 Units  Subcutaneous QHS   methocarbamol  500 mg Oral QID   multivitamin  1 tablet Oral QHS   pantoprazole  80 mg Oral Daily   pentafluoroprop-tetrafluoroeth       polyethylene glycol  17 g Oral Daily   sevelamer carbonate  2,400 mg Oral TID WC   sodium zirconium cyclosilicate  10 g Oral Once per day on Sun Tue Thu Sat    Dialysis Orders: MWF at [facility], 400/500, EDW 90kg, 2K/2Ca, LUE AVF - heparin 4000 unit IV bolus, no ESA   Assessment/Plan: 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 Completed 6 wk course of IV Daptomycin on 10/12/21. Per Dr. Jess Barters note 12/28/2021 "NO RESTRICTIONS L HIP".  Chronic L hip/residual limb pain: Hx fractures of L femoral neck (where hardware was removed) as well as distal femur Fx. Pain control per primary.  ESRD: Usual MWF schedule. Clots HD system without heparin, 4000 unit bolus and 2000 unit mid-run bolus. Refused HD last week d/t not liking the  timing of it - managers aware and have discussed with patient. HD now - 5L UFG, 2K. Hyperkalemia: Stable on scheduled lokelma 4 days a week. HTN/volume: BP meds have been tapered down - currently on Coreg 3.125mg  BID only. Large fluids gains between HD which has been discussed extensively, Ongoing edema, UF prn. 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 Last tsat 54%. Aranesp on hold. Secondary HPTH: CorrCa ok, no VDRA. Last phos trending up, continue Renvela as binder. Nutrition: Alb low, continue protein supplements.  Renal diet w/fluid restrictions.   AVF dysfunction (resolved): S/p branch ligation 09/19/2021  Dr. Virl Cagey. AVF working well now. DM2: Insulin per primary Dispo: SNF previously recommended, but she declined. Not home HD candidate.  Dialyzed in recliner on 9/4 but signed off early due to pain, has declined to come in a recliner since then.  Strongly discussed on the importance of increasing her mobility as much as possible while she is in her room in addition to following her  ongoing fluid restriction. Also reinforced the importance of being in the chair for HD treatments to improve strength and mobility. Does not like to do PT after HD, will try to do HD in the afternoons so she can do PT in the AM. Per Renal Navigator notes there are no clinics that will accept patient unless she will have HD in chair. Will continue to encourage use of chair for HD.  Veneta Penton, PA-C 02/05/2022, 10:51 AM  [employer]

## 2022-02-06 DIAGNOSIS — M86252 Subacute osteomyelitis, left femur: Secondary | ICD-10-CM | POA: Diagnosis not present

## 2022-02-06 LAB — CBC
HCT: 38.6 % (ref 36.0–46.0)
Hemoglobin: 12.3 g/dL (ref 12.0–15.0)
MCH: 29.5 pg (ref 26.0–34.0)
MCHC: 31.9 g/dL (ref 30.0–36.0)
MCV: 92.6 fL (ref 80.0–100.0)
Platelets: 240 10*3/uL (ref 150–400)
RBC: 4.17 MIL/uL (ref 3.87–5.11)
RDW: 15 % (ref 11.5–15.5)
WBC: 7.9 10*3/uL (ref 4.0–10.5)
nRBC: 0 % (ref 0.0–0.2)

## 2022-02-06 LAB — BASIC METABOLIC PANEL
Anion gap: 14 (ref 5–15)
BUN: 67 mg/dL — ABNORMAL HIGH (ref 6–20)
CO2: 25 mmol/L (ref 22–32)
Calcium: 9.2 mg/dL (ref 8.9–10.3)
Chloride: 90 mmol/L — ABNORMAL LOW (ref 98–111)
Creatinine, Ser: 5.5 mg/dL — ABNORMAL HIGH (ref 0.44–1.00)
GFR, Estimated: 9 mL/min — ABNORMAL LOW (ref >60)
Glucose, Bld: 120 mg/dL — ABNORMAL HIGH (ref 70–99)
Potassium: 4.7 mmol/L (ref 3.5–5.1)
Sodium: 129 mmol/L — ABNORMAL LOW (ref 135–145)

## 2022-02-06 LAB — GLUCOSE, CAPILLARY
Glucose-Capillary: 141 mg/dL — ABNORMAL HIGH (ref 70–99)
Glucose-Capillary: 165 mg/dL — ABNORMAL HIGH (ref 70–99)
Glucose-Capillary: 163 mg/dL — ABNORMAL HIGH (ref 70–99)
Glucose-Capillary: 179 mg/dL — ABNORMAL HIGH (ref 70–99)

## 2022-02-06 LAB — PHOSPHORUS: Phosphorus: 5.5 mg/dL — ABNORMAL HIGH (ref 2.5–4.6)

## 2022-02-06 LAB — MAGNESIUM: Magnesium: 1.6 mg/dL — ABNORMAL LOW (ref 1.7–2.4)

## 2022-02-06 MED ORDER — MAGNESIUM SULFATE 2 GM/50ML IV SOLN
2.0000 g | Freq: Once | INTRAVENOUS | Status: AC
  Administered 2022-02-06: 2 g via INTRAVENOUS
  Filled 2022-02-06: qty 50

## 2022-02-06 NOTE — Progress Notes (Signed)
Occupational Therapy Treatment Patient Details Name: [name] MRN: [id number] DOB: Oct 06, 1965 Today's Date: 02/06/2022   History of present illness 56 y.o. female presented to ED 6/26 from dialysis with increased bloody drainage from L hip wound. 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 s/p hardware removal of left hip with partial resection of tuft tissue and femure that were nonviable on 08/10/21 Discharged home 6/23. underwent L hip debridement and wound vac placement 6/28; +Left intertrochanteric non-union,  Fracture of left distal femur  PMH: hypertension, hyperlipidemia, ESRD on HD MWF, history of left BKA in 2021, depression/anxiety, stroke, tobacco use, T2DM,  insomnia, chronic pain syndrome,   OT comments  Patient with incremental progress toward patient focused goals.  Unable to attempt sit to stand transfers this date at the [facility] for improved independence with toileting, due to R ankle pain.  Bruising noted to lateral aspect of ankle.  OT continue efforts with SNF recommended for post acute rehab.  Patient has to be at least Mod I at wheelchair level to transition home.      Recommendations for follow up therapy are one component of a multi-disciplinary discharge planning process, led by the attending physician.  Recommendations may be updated based on patient status, additional functional criteria and insurance authorization.    Follow Up Recommendations  Skilled nursing-short term rehab (<3 hours/day)     Assistance Recommended at Discharge Intermittent Supervision/Assistance  Patient can return home with the following  A lot of help with bathing/dressing/bathroom;Assistance with cooking/housework;Assist for transportation;Help with stairs or ramp for entrance;Two people to help with walking and/or transfers   Equipment Recommendations  None recommended by OT    Recommendations for Other Services      Precautions / Restrictions  Precautions Precautions: Fall;Other (comment) Precaution Comments: L BKA (baseline) Restrictions Weight Bearing Restrictions: Yes LLE Weight Bearing: Weight bearing as tolerated       Mobility Bed Mobility Overal bed mobility: Needs Assistance Bed Mobility: Supine to Sit, Sit to Supine     Supine to sit: Min guard, HOB elevated Sit to supine: Min assist     Patient Response: Flat affect  Transfers Overall transfer level: Needs assistance Equipment used: Ambulation equipment used Transfers: Sit to/from Stand Sit to Stand: From elevated surface, Total assist                 Balance Overall balance assessment: Needs assistance Sitting-balance support: Feet supported Sitting balance-Leahy Scale: Good                                     ADL either performed or assessed with clinical judgement   ADL       Grooming: Sitting;Set up                                      Extremity/Trunk Assessment Upper Extremity Assessment Upper Extremity Assessment: Overall WFL for tasks assessed       Cervical / Trunk Assessment Cervical / Trunk Assessment: Normal                      Cognition Arousal/Alertness: Awake/alert Behavior During Therapy: WFL for tasks assessed/performed Overall Cognitive Status: No family/caregiver present to determine baseline cognitive functioning  Pertinent Vitals/ Pain       Pain Assessment Faces Pain Scale: Hurts even more Pain Location: R ankle Pain Descriptors / Indicators: Guarding, Grimacing Pain Intervention(s): Limited activity within patient's tolerance                                                          Frequency  Min 2X/week        Progress Toward Goals  OT Goals(current goals can now be found in the care plan section)  Progress towards OT goals: Progressing toward  goals  Acute Rehab OT Goals OT Goal Formulation: With patient Time For Goal Achievement: 02/11/22 Potential to Achieve Goals: [place] Discharge plan remains appropriate;Frequency remains appropriate    Co-evaluation                 AM-PAC OT "6 Clicks" Daily Activity     Outcome Measure   Help from another person eating meals?: None Help from another person taking care of personal grooming?: None Help from another person toileting, which includes using toliet, bedpan, or urinal?: A Lot Help from another person bathing (including washing, rinsing, drying)?: A Lot Help from another person to put on and taking off regular upper body clothing?: A Little Help from another person to put on and taking off regular lower body clothing?: A Lot 6 Click Score: 17    End of Session    OT Visit Diagnosis: Muscle weakness (generalized) (M62.81);Unsteadiness on feet (R26.81);History of falling (Z91.81)   Activity Tolerance Patient limited by pain   Patient Left in bed;with call bell/phone within reach   Nurse Communication Mobility status        Time: 1420-1440 OT Time Calculation (min): 20 min  Charges: OT General Charges $OT Visit: 1 Visit OT Treatments $Self Care/Home Management : 8-22 mins  02/06/2022  RP, OTR/L  Acute Rehabilitation Services  Office:  [phone]   Metta Clines 02/06/2022, 3:12 PM

## 2022-02-06 NOTE — Progress Notes (Signed)
Mobility Specialist Progress Note:   02/06/22 1600  Mobility  Activity  (Stretches and Exercises)  Range of Motion/Exercises Active;Passive;Left leg  Activity Response Tolerated well  Mobility Referral Yes  $Mobility charge 1 Mobility    Pt was eager for stretching and exercises. Continues to c/o R ankle pain. Left pt in bed with all needs met.    Nelta Numbers Mobility Specialist Please contact via SecureChat or  Rehab office at [phone]

## 2022-02-06 NOTE — Progress Notes (Signed)
PROGRESS NOTE    [name]  SWF:[id number] DOB: 1965-12-20 DOA: 08/26/2021 PCP: [name], MD   Brief Narrative:  This 56 year old female with PMH significant of hypertension, hyperlipidemia, diabetes mellitus type 2, end-stage renal disease on hemodialysis, status post left BKA for chronic calcaneus osteomyelitis on 01/12/2020, bipolar disorder, left distal femoral fracture after a fall on 07/19/2021 deemed  nonoperative, admission from 08/03/2021-08/23/2021 for left femoral osteomyelitis requiring partial resection of femur and rearrangements of bony fragments with subsequent antibiotic treatment presented to the hospital with left hip bleeding and was found to have subacute osteomyelitis and abscess of left femur.  Orthopedic/ID were consulted and patient was started on broad-spectrum antibiotics.  Patient underwent multiple I&D's by Dr. [name] along with local tissue rearrangement.  Patient was also seen by vascular surgery who did fistula branch ligation.  Patient has completed 6-week course of daptomycin on 10/09/2021.  She has been unable to complete hemodialysis in chair and outpatient hemodialysis unit has been hard to find.  She is otherwise medically stable for discharge.   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  Active Problems:   ESRD (end stage renal disease) on dialysis ([place])   Type 2 diabetes mellitus with hyperlipidemia (HCC)   History of CVA (cerebrovascular accident)   Essential hypertension   Anxiety and depression   GASTROESOPHAGEAL REFLUX, NO ESOPHAGITIS   TOBACCO DEPENDENCE   Hyponatremia   Obesity (BMI 30.0-34.9)   Unilateral amputation of left foot (HCC)   Wound infection   History of CVA (cerebrovascular accident) without residual deficits   Hyperkalemia   Wound dehiscence, surgical, sequela  Subacute osteomyelitis of left femur with abscess: Status post surgical intervention by Dr. [name] with multiple left hip  debridements.   She has completed a 6-week course of daptomycin on 10/09/2021.    Re-imaging on 12/02/2021 of left knee showed healing fracture with callus.   Orthopedics Dr [name] has followed the patient during hospitalization and is doing well.   No left hip restrictions at this time.  Continue fentanyl patch, oxycodone and Robaxin   Chronic pain syndrome:  Continue  fentanyl patch, oxycodone, Robaxin and amitriptyline.     Right thigh cellulitis: Treated with antibiotics and resolved.   History of left intertrochanteric fracture: Left hip hardware removed on 08/10/2021 prior to admission.   Physical therapy working to scoot and mobilize with wheelchair.   She will need to tolerate sitting for at least 4 hours to be able to be considered for outpatient hemodialysis.   She has been encouraged to participate more with physical therapy..     End-stage renal disease on hemodialysis: Nephrology on board for hemodialysis needs.   Awaiting for outpatient hemodialysis tolerance in a chair.   Hyponatremia: Sodium today at 129.  She remains asymptomatic. Continue to monitor BMP periodically.   Borderline hyperkalemia.   Latest potassium 5.0 on 02/05/2022.   Continue Lokelma as needed. Potassium normalized > 4.7   Hyperphosphatemia: continue sevelamer.   Anemia of chronic disease: Latest hemoglobin 12.3.  Continue to monitor.   Diabetes mellitus type 2 with hyperglycemia: Last hemoglobin A1c of 5.9 on 11/14/2021.   Continue mealtime sliding scale and long-acting insulin.   Closely monitor.  Latest POC glucose of 116   Essential hypertension: Continue Coreg, hydralazine, Lasix.   Blood pressure reasonably controlled.   History of unspecified CVA Hyperlipidemia Continue Lipitor.   GERD Continue Protonix and Pepcid   Tobacco use Stable at  this time. Not interested in quit smoking.   Anxiety and depression Continue amitriptyline and Cymbalta.   DVT prophylaxis: SCDs Code  Status: Full code. Family Communication: No family at bed side. Disposition Plan:  Status is: Inpatient Remains inpatient appropriate because: Uncertain at this time.  TOC involved.  Pending disposition , Pending tolerance of dialysis in chair.    Consultants:  Nephrology Vascular surgery Orthopedic Infectious disease  Procedures:  Multiple I&D's with tissue arrangement. Fistula branch ligation. Hemodialysis.  Antimicrobials:  Ceftriaxone 10/15 - 10/18 Doxycycline 10/14 - 10/14 Cefazolin 7/1 - 7/1, 7/20 - 7/20 Daptomycin 6/29 - 8/11  Subjective: Patient was seen and examined at bedside. She denies any specific symptoms. She reports she is trying to do her best to do physical therapy.   She wants to make sure that she is stronger enough before she goes home.  Objective: Vitals:   02/05/22 1622 02/05/22 2048 02/06/22 0524 02/06/22 1118  BP: (!) 167/65 (!) 159/55 (!) 156/57 (!) 169/64  Pulse: 76 74 (!) 58 61  Resp: 18 18 17 18   Temp: 98.4 F (36.9 C) 98.6 F (37 C) 98 F (36.7 C) 98.3 F (36.8 C)  TempSrc: Oral Oral Oral Oral  SpO2: 98% 98% 100% 99%  Weight:      Height:        Intake/Output Summary (Last 24 hours) at 02/06/2022 1157 Last data filed at 02/06/2022 0800 Gross per 24 hour  Intake 960 ml  Output 3500 ml  Net -2540 ml   Filed Weights   02/04/22 2056 02/05/22 0843 02/05/22 0850  Weight: 101.2 kg 95.9 kg 95.9 kg    Examination:  General exam: Appears comfortable, not in any acute distress.  Deconditioned Respiratory system: CTA bilaterally, respiratory effort normal, no accessory muscle use, RR 15 Cardiovascular system: S1 & S2 heard, regular rate and rhythm, no murmur. Gastrointestinal system: Abdomen is soft, non tender, non distended, BS+ Central nervous system: Alert and oriented x 3. No focal neurological deficits. Extremities: Left below-knee amputation, left hip with healed surgical scar. Skin: No rashes, lesions or ulcers Psychiatry:  Judgement and insight appear normal. Mood & affect appropriate.     Data Reviewed: I have personally reviewed following labs and imaging studies  CBC: Recent Labs  Lab 01/31/22 0838 02/03/22 0825 02/05/22 0856 02/06/22 0712  WBC 7.2 7.2 8.0 7.9  NEUTROABS  --  4.2  --   --   HGB 11.0* 10.8* 11.5* 12.3  HCT 34.1* 33.7* 33.5* 38.6  MCV 91.2 92.1 90.3 92.6  PLT 230 234 237 253   Basic Metabolic Panel: Recent Labs  Lab 01/31/22 0837 02/01/22 1707 02/03/22 0825 02/05/22 0856 02/06/22 0712  NA 126* 130* 128* 126* 129*  K 5.7* 4.9 5.3* 5.0 4.7  CL 90* 92* 90* 88* 90*  CO2 23 26 23 24 25   GLUCOSE 169* 251* 216* 188* 120*  BUN 104* 70* 107* 104* 67*  CREATININE 6.96* 5.78* 7.35* 6.85* 5.50*  CALCIUM 8.6* 8.9 8.8* 8.7* 9.2  MG  --   --   --   --  1.6*  PHOS  --  4.9* 6.4* 6.4* 5.5*   GFR: Estimated Creatinine Clearance: 14.7 mL/min (A) (by C-G formula based on SCr of 5.5 mg/dL (H)). Liver Function Tests: Recent Labs  Lab 02/01/22 1707 02/03/22 0825 02/05/22 0856  ALBUMIN 3.7 3.6 3.6   No results for input(s): "LIPASE", "AMYLASE" in the last 168 hours. No results for input(s): "AMMONIA" in the last 168 hours. Coagulation  Profile: No results for input(s): "INR", "PROTIME" in the last 168 hours. Cardiac Enzymes: No results for input(s): "CKTOTAL", "CKMB", "CKMBINDEX", "TROPONINI" in the last 168 hours. BNP (last 3 results) No results for input(s): "PROBNP" in the last 8760 hours. HbA1C: No results for input(s): "HGBA1C" in the last 72 hours. CBG: Recent Labs  Lab 02/05/22 1338 02/05/22 1650 02/05/22 2048 02/06/22 0729 02/06/22 1119  GLUCAP 153* 253* 256* 141* 165*   Lipid Profile: No results for input(s): "CHOL", "HDL", "LDLCALC", "TRIG", "CHOLHDL", "LDLDIRECT" in the last 72 hours. Thyroid Function Tests: No results for input(s): "TSH", "T4TOTAL", "FREET4", "T3FREE", "THYROIDAB" in the last 72 hours. Anemia Panel: No results for input(s): "VITAMINB12",  "FOLATE", "FERRITIN", "TIBC", "IRON", "RETICCTPCT" in the last 72 hours. Sepsis Labs: No results for input(s): "PROCALCITON", "LATICACIDVEN" in the last 168 hours.  No results found for this or any previous visit (from the past 240 hour(s)).  Radiology Studies: No results found.  Scheduled Meds:  (feeding supplement) PROSource Plus  30 mL Oral BID BM   acidophilus  1 capsule Oral Daily   alosetron  1 mg Oral BID   amitriptyline  50 mg Oral QHS   atorvastatin  40 mg Oral q1800   carvedilol  3.125 mg Oral BID   docusate sodium  200 mg Oral BID   DULoxetine  60 mg Oral Daily   famotidine  20 mg Oral Daily   fentaNYL  1 patch Transdermal Q72H   furosemide  80 mg Oral Daily   hydrALAZINE  50 mg Oral TID   insulin aspart  0-9 Units Subcutaneous TID WC   insulin aspart  6 Units Subcutaneous TID WC   insulin glargine-yfgn  10 Units Subcutaneous QHS   methocarbamol  500 mg Oral QID   multivitamin  1 tablet Oral QHS   pantoprazole  80 mg Oral Daily   polyethylene glycol  17 g Oral Daily   sevelamer carbonate  2,400 mg Oral TID WC   sodium zirconium cyclosilicate  10 g Oral Once per day on Sun Tue Thu Sat   Continuous Infusions:  albumin human     magnesium sulfate bolus IVPB       LOS: 164 days    Time spent: 35 mins    Vickie Melnik, MD [facility]   If 7PM-7AM, please contact night-coverage

## 2022-02-06 NOTE — Progress Notes (Signed)
[facility] Progress Note   Subjective:  Seen in room - cramping and hypotension at end of HD yesterday, rare for her. Feels better now.   Objective Vitals:   02/05/22 1622 02/05/22 2048 02/06/22 0524 02/06/22 1118  BP: (!) 167/65 (!) 159/55 (!) 156/57 (!) 169/64  Pulse: 76 74 (!) 58 61  Resp: 18 18 17 18   Temp: 98.4 F (36.9 C) 98.6 F (37 C) 98 F (36.7 C) 98.3 F (36.8 C)  TempSrc: Oral Oral Oral Oral  SpO2: 98% 98% 100% 99%  Weight:      Height:       Physical Exam General: Well appearing woman, NAD. Room air Heart: RRR; no murmur Lungs: CTAB; no rales Abdomen: soft Extremities: L BKA, 1+ stump edema and distal RLE edema Dialysis Access: LUE AVF + bruit  Additional Objective Labs: Basic Metabolic Panel: Recent Labs  Lab 02/03/22 0825 02/05/22 0856 02/06/22 0712  NA 128* 126* 129*  K 5.3* 5.0 4.7  CL 90* 88* 90*  CO2 23 24 25   GLUCOSE 216* 188* 120*  BUN 107* 104* 67*  CREATININE 7.35* 6.85* 5.50*  CALCIUM 8.8* 8.7* 9.2  PHOS 6.4* 6.4* 5.5*   Liver Function Tests: Recent Labs  Lab 02/01/22 1707 02/03/22 0825 02/05/22 0856  ALBUMIN 3.7 3.6 3.6   CBC: Recent Labs  Lab 01/31/22 0838 02/03/22 0825 02/05/22 0856 02/06/22 0712  WBC 7.2 7.2 8.0 7.9  NEUTROABS  --  4.2  --   --   HGB 11.0* 10.8* 11.5* 12.3  HCT 34.1* 33.7* 33.5* 38.6  MCV 91.2 92.1 90.3 92.6  PLT 230 234 237 240   Medications:  albumin human      (feeding supplement) PROSource Plus  30 mL Oral BID BM   acidophilus  1 capsule Oral Daily   alosetron  1 mg Oral BID   amitriptyline  50 mg Oral QHS   atorvastatin  40 mg Oral q1800   carvedilol  3.125 mg Oral BID   docusate sodium  200 mg Oral BID   DULoxetine  60 mg Oral Daily   famotidine  20 mg Oral Daily   fentaNYL  1 patch Transdermal Q72H   furosemide  80 mg Oral Daily   hydrALAZINE  50 mg Oral TID   insulin aspart  0-9 Units Subcutaneous TID WC   insulin aspart  6 Units Subcutaneous TID WC   insulin  glargine-yfgn  10 Units Subcutaneous QHS   methocarbamol  500 mg Oral QID   multivitamin  1 tablet Oral QHS   pantoprazole  80 mg Oral Daily   polyethylene glycol  17 g Oral Daily   sevelamer carbonate  2,400 mg Oral TID WC   sodium zirconium cyclosilicate  10 g Oral Once per day on Sun Tue Thu Sat    Dialysis Orders: MWF at [place], 400/500, EDW 90kg, 2K/2Ca, LUE AVF - heparin 4000 unit IV bolus, no ESA   Assessment/Plan: 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 Completed 6 wk course of IV Daptomycin on 10/12/21. Per Dr. Jess Barters note 12/28/2021 "NO RESTRICTIONS L HIP".  Chronic L hip/residual limb pain: Hx fractures of L femoral neck (where hardware was removed) as well as distal femur Fx. Pain control per primary.  ESRD: Usual MWF schedule. Clots HD system without heparin, 4000 unit bolus and 2000 unit mid-run bolus. Refused HD last week d/t not liking the timing of it - managers aware and have discussed  with patient. HD tomorrow, slightly lower goal d/t cramping last time - still with edema. Hyperkalemia: Stable on scheduled lokelma 4 days a week. HTN/volume: BP meds have been tapered down - currently on Coreg 3.125mg  BID only. Large fluids gains between HD which has been discussed extensively, Ongoing edema, UF prn. Anemia of ESRD: Hgb 12.3. Last tsat 54%. Aranesp on hold. Secondary HPTH: CorrCa ok, no VDRA. Phos improving, continue Renvela as binder. Nutrition: Alb low, continue protein supplements.  Renal diet w/fluid restrictions.   AVF dysfunction (resolved): S/p branch ligation 09/19/2021  Dr. Virl Cagey. AVF working well now. DM2: Insulin per primary Dispo: SNF previously recommended, but she declined. Not home HD candidate.  Dialyzed in recliner on 9/4 but signed off early due to pain, has declined to come in a recliner since then.  Strongly discussed on the importance of increasing her mobility as much as possible while she is in her  room in addition to following her ongoing fluid restriction. Also reinforced the importance of being in the chair for HD treatments to improve strength and mobility. Does not like to do PT after HD, will try to do HD in the afternoons so she can do PT in the AM. Per [facility]igator notes there are no clinics that will accept patient unless she will have HD in chair. Will continue to encourage use of chair for HD.  Veneta Penton, PA-C 02/06/2022, 11:34 AM  [employer]

## 2022-02-07 DIAGNOSIS — M86252 Subacute osteomyelitis, left femur: Secondary | ICD-10-CM | POA: Diagnosis not present

## 2022-02-07 LAB — CBC
HCT: 36.2 % (ref 36.0–46.0)
Hemoglobin: 12.1 g/dL (ref 12.0–15.0)
MCH: 29.9 pg (ref 26.0–34.0)
MCHC: 33.4 g/dL (ref 30.0–36.0)
MCV: 89.4 fL (ref 80.0–100.0)
Platelets: 208 10*3/uL (ref 150–400)
RBC: 4.05 MIL/uL (ref 3.87–5.11)
RDW: 14.4 % (ref 11.5–15.5)
WBC: 6.1 10*3/uL (ref 4.0–10.5)
nRBC: 0 % (ref 0.0–0.2)

## 2022-02-07 LAB — RENAL FUNCTION PANEL
Albumin: 3.9 g/dL (ref 3.5–5.0)
Anion gap: 16 — ABNORMAL HIGH (ref 5–15)
BUN: 101 mg/dL — ABNORMAL HIGH (ref 6–20)
CO2: 23 mmol/L (ref 22–32)
Calcium: 9.4 mg/dL (ref 8.9–10.3)
Chloride: 90 mmol/L — ABNORMAL LOW (ref 98–111)
Creatinine, Ser: 6.73 mg/dL — ABNORMAL HIGH (ref 0.44–1.00)
GFR, Estimated: 7 mL/min — ABNORMAL LOW (ref >60)
Glucose, Bld: 113 mg/dL — ABNORMAL HIGH (ref 70–99)
Phosphorus: 6.2 mg/dL — ABNORMAL HIGH (ref 2.5–4.6)
Potassium: 5.2 mmol/L — ABNORMAL HIGH (ref 3.5–5.1)
Sodium: 129 mmol/L — ABNORMAL LOW (ref 135–145)

## 2022-02-07 LAB — GLUCOSE, CAPILLARY
Glucose-Capillary: 167 mg/dL — ABNORMAL HIGH (ref 70–99)
Glucose-Capillary: 122 mg/dL — ABNORMAL HIGH (ref 70–99)
Glucose-Capillary: 277 mg/dL — ABNORMAL HIGH (ref 70–99)

## 2022-02-07 MED ORDER — HEPARIN SODIUM (PORCINE) 1000 UNIT/ML IJ SOLN
INTRAMUSCULAR | Status: AC
  Filled 2022-02-07: qty 2

## 2022-02-07 MED ORDER — HEPARIN SODIUM (PORCINE) 1000 UNIT/ML DIALYSIS
4000.0000 [IU] | Freq: Once | INTRAMUSCULAR | Status: AC
  Administered 2022-02-07: 4000 [IU] via INTRAVENOUS_CENTRAL
  Filled 2022-02-07: qty 4

## 2022-02-07 MED ORDER — HEPARIN SODIUM (PORCINE) 1000 UNIT/ML IJ SOLN
2000.0000 [IU] | Freq: Once | INTRAMUSCULAR | Status: AC
  Administered 2022-02-12: 2000 [IU] via INTRAVENOUS
  Filled 2022-02-07: qty 2

## 2022-02-07 NOTE — Progress Notes (Signed)
PT Cancellation Note  Patient Details Name: [name] MRN: [id number] DOB: [birth year]   Cancelled Treatment:    Reason Eval/Treat Not Completed: Patient at procedure or test/unavailable  Off unit for dialysis. Will follow up as time allows.  Candie Mile, PT, DPT Physical Therapist [facility] [place]  02/07/2022, 2:08 PM

## 2022-02-07 NOTE — Progress Notes (Signed)
   02/07/22 1819  Vitals  Temp 98.1 F (36.7 C)  Temp Source Oral  BP (!) 150/67  MAP (mmHg) 91  BP Location Right Arm  BP Method Automatic  Patient Position (if appropriate) Lying  Pulse Rate 64  Pulse Rate Source Monitor  ECG Heart Rate 64  Resp 13  Oxygen Therapy  SpO2 98 %  O2 Device Room Air  Pulse Oximetry Type Continuous   Received patient in bed to unit.  Alert and oriented.  Informed consent signed and in chart.   Treatment initiated: 1411 Treatment completed: 1752  Patient tolerated well.  Transported back to the room  Alert, without acute distress.  Hand-off given to patient's nurse.   Access used: AVF Access issues: NA  Total UF removed: 3542ml Medication(s) given: Heparin 4000 units bolus at beginning of tx, Heparin 2000 units bolus mid tx Post HD VS: see above Post HD weight: 93.5kg   [facility]

## 2022-02-07 NOTE — Progress Notes (Signed)
PROGRESS NOTE    [name]  ZJI:[id number] DOB: 1965/07/30 DOA: 08/26/2021 PCP: [name], MD   Brief Narrative:  This 56 year old female with PMH significant of hypertension, hyperlipidemia, diabetes mellitus type 2, end-stage renal disease on hemodialysis, status post left BKA for chronic calcaneus osteomyelitis on 01/12/2020, bipolar disorder, left distal femoral fracture after a fall on 07/19/2021 deemed  nonoperative, admission from 08/03/2021-08/23/2021 for left femoral osteomyelitis requiring partial resection of femur and rearrangements of bony fragments with subsequent antibiotic treatment presented to the hospital with left hip bleeding and was found to have subacute osteomyelitis and abscess of left femur.  Orthopedic/ID were consulted and patient was started on broad-spectrum antibiotics.  Patient underwent multiple I&D's by Dr. [name] along with local tissue rearrangement.  Patient was also seen by vascular surgery who did fistula branch ligation.  Patient has completed 6-week course of daptomycin on 10/09/2021.  She has been unable to complete hemodialysis in chair and outpatient hemodialysis unit has been hard to find.  She is otherwise medically stable for discharge.   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  Active Problems:   ESRD (end stage renal disease) on dialysis ([place])   Type 2 diabetes mellitus with hyperlipidemia (HCC)   History of CVA (cerebrovascular accident)   Essential hypertension   Anxiety and depression   GASTROESOPHAGEAL REFLUX, NO ESOPHAGITIS   TOBACCO DEPENDENCE   Hyponatremia   Obesity (BMI 30.0-34.9)   Unilateral amputation of left foot (HCC)   Wound infection   History of CVA (cerebrovascular accident) without residual deficits   Hyperkalemia   Wound dehiscence, surgical, sequela  Subacute osteomyelitis of left femur with abscess: Status post surgical intervention by Dr. [name] with multiple left hip  debridements.   She has completed a 6-week course of daptomycin on 10/09/2021.    Re-imaging on 12/02/2021 of left knee showed healing fracture with callus.   Orthopedics Dr [name] has followed the patient during hospitalization and is doing well.   No left hip restrictions at this time.  Continue fentanyl patch, oxycodone and Robaxin.   Chronic pain syndrome:  Continue  fentanyl patch, oxycodone, Robaxin and amitriptyline.     Right thigh cellulitis: Treated with antibiotics and resolved.   History of left intertrochanteric fracture: Left hip hardware removed on 08/10/2021 prior to admission.   Physical therapy working to scoot and mobilize with wheelchair.   She will need to tolerate sitting for at least 4 hours to be able to be considered for outpatient hemodialysis.   She has been encouraged to participate more with physical therapy..     End-stage renal disease on hemodialysis: Nephrology on board for hemodialysis needs.   Awaiting for outpatient hemodialysis tolerance in a chair.   Hyponatremia: Sodium at 129.  She remains asymptomatic. Continue to monitor BMP periodically.   Borderline hyperkalemia.   Latest potassium 5.0 on 02/05/2022.   Continue Lokelma as needed. Potassium normalized > 4.7   Hyperphosphatemia: continue sevelamer.  Hypomagnesemia: Replaced. Continue to monitor   Anemia of chronic disease: Latest hemoglobin 12.3.  Continue to monitor.   Diabetes mellitus type 2 with hyperglycemia: Last hemoglobin A1c of 5.9 on 11/14/2021.   Continue mealtime sliding scale and long-acting insulin.   Closely monitor.  Latest POC glucose of 116   Essential hypertension: Continue Coreg, hydralazine, Lasix.   Blood pressure reasonably controlled.   History of unspecified CVA Hyperlipidemia Continue Lipitor.   GERD Continue Protonix and Pepcid.  Tobacco use Stable at this time. Not interested in quit smoking.   Anxiety and depression Continue amitriptyline and  Cymbalta.   DVT prophylaxis: SCDs Code Status: Full code. Family Communication: No family at bed side. Disposition Plan:  Status is: Inpatient Remains inpatient appropriate because: Uncertain at this time.  TOC involved.  Pending disposition , Pending tolerance of dialysis in chair.    Consultants:  Nephrology Vascular surgery Orthopedic Infectious disease  Procedures:  Multiple I&D's with tissue arrangement. Fistula branch ligation. Hemodialysis.  Antimicrobials:  Ceftriaxone 10/15 - 10/18 Doxycycline 10/14 - 10/14 Cefazolin 7/1 - 7/1, 7/20 - 7/20 Daptomycin 6/29 - 8/11  Subjective: Patient was seen and examined at bedside. She denies any specific symptoms. She states that she is trying to do more physical therapy. Patient is not able to sit in the chair long enough to complete dialysis.  Objective: Vitals:   02/06/22 1720 02/06/22 2021 02/07/22 0534 02/07/22 0909  BP: (!) 150/55 (!) 154/56 (!) 180/60 (!) 168/60  Pulse: 63 65 (!) 58 (!) 55  Resp: 18   17  Temp: 98.7 F (37.1 C) 98.2 F (36.8 C) 98.4 F (36.9 C) 98.1 F (36.7 C)  TempSrc: Oral Oral Oral Oral  SpO2: 97% 95% 100% 98%  Weight:      Height:        Intake/Output Summary (Last 24 hours) at 02/07/2022 1117 Last data filed at 02/07/2022 0600 Gross per 24 hour  Intake 1160 ml  Output 200 ml  Net 960 ml   Filed Weights   02/04/22 2056 02/05/22 0843 02/05/22 0850  Weight: 101.2 kg 95.9 kg 95.9 kg    Examination:  General exam: Appears comfortable, not in any acute distress.  Deconditioned Respiratory system: CTA bilaterally, respiratory effort normal, no accessory muscle use, RR 15 Cardiovascular system: S1 & S2 heard, regular rate and rhythm, no murmur. Gastrointestinal system: Abdomen is soft, non tender, non distended, BS+ Central nervous system: Alert and oriented x 3, no focal neurological deficits. Extremities: Left below knee amputation, left hip with healed surgical scar. Skin: No  rashes, lesions or ulcers Psychiatry: Judgement and insight appear normal. Mood & affect appropriate.     Data Reviewed: I have personally reviewed following labs and imaging studies  CBC: Recent Labs  Lab 02/03/22 0825 02/05/22 0856 02/06/22 0712  WBC 7.2 8.0 7.9  NEUTROABS 4.2  --   --   HGB 10.8* 11.5* 12.3  HCT 33.7* 33.5* 38.6  MCV 92.1 90.3 92.6  PLT 234 237 970   Basic Metabolic Panel: Recent Labs  Lab 02/01/22 1707 02/03/22 0825 02/05/22 0856 02/06/22 0712  NA 130* 128* 126* 129*  K 4.9 5.3* 5.0 4.7  CL 92* 90* 88* 90*  CO2 26 23 24 25   GLUCOSE 251* 216* 188* 120*  BUN 70* 107* 104* 67*  CREATININE 5.78* 7.35* 6.85* 5.50*  CALCIUM 8.9 8.8* 8.7* 9.2  MG  --   --   --  1.6*  PHOS 4.9* 6.4* 6.4* 5.5*   GFR: Estimated Creatinine Clearance: 14.7 mL/min (A) (by C-G formula based on SCr of 5.5 mg/dL (H)). Liver Function Tests: Recent Labs  Lab 02/01/22 1707 02/03/22 0825 02/05/22 0856  ALBUMIN 3.7 3.6 3.6   No results for input(s): "LIPASE", "AMYLASE" in the last 168 hours. No results for input(s): "AMMONIA" in the last 168 hours. Coagulation Profile: No results for input(s): "INR", "PROTIME" in the last 168 hours. Cardiac Enzymes: No results for input(s): "CKTOTAL", "CKMB", "CKMBINDEX", "TROPONINI" in the  last 168 hours. BNP (last 3 results) No results for input(s): "PROBNP" in the last 8760 hours. HbA1C: No results for input(s): "HGBA1C" in the last 72 hours. CBG: Recent Labs  Lab 02/06/22 0729 02/06/22 1119 02/06/22 1653 02/06/22 2125 02/07/22 0718  GLUCAP 141* 165* 163* 179* 167*   Lipid Profile: No results for input(s): "CHOL", "HDL", "LDLCALC", "TRIG", "CHOLHDL", "LDLDIRECT" in the last 72 hours. Thyroid Function Tests: No results for input(s): "TSH", "T4TOTAL", "FREET4", "T3FREE", "THYROIDAB" in the last 72 hours. Anemia Panel: No results for input(s): "VITAMINB12", "FOLATE", "FERRITIN", "TIBC", "IRON", "RETICCTPCT" in the last 72  hours. Sepsis Labs: No results for input(s): "PROCALCITON", "LATICACIDVEN" in the last 168 hours.  No results found for this or any previous visit (from the past 240 hour(s)).  Radiology Studies: No results found.  Scheduled Meds:  (feeding supplement) PROSource Plus  30 mL Oral BID BM   acidophilus  1 capsule Oral Daily   alosetron  1 mg Oral BID   amitriptyline  50 mg Oral QHS   atorvastatin  40 mg Oral q1800   carvedilol  3.125 mg Oral BID   docusate sodium  200 mg Oral BID   DULoxetine  60 mg Oral Daily   famotidine  20 mg Oral Daily   fentaNYL  1 patch Transdermal Q72H   furosemide  80 mg Oral Daily   heparin  4,000 Units Dialysis Once in dialysis   hydrALAZINE  50 mg Oral TID   insulin aspart  0-9 Units Subcutaneous TID WC   insulin aspart  6 Units Subcutaneous TID WC   insulin glargine-yfgn  10 Units Subcutaneous QHS   methocarbamol  500 mg Oral QID   multivitamin  1 tablet Oral QHS   pantoprazole  80 mg Oral Daily   polyethylene glycol  17 g Oral Daily   sevelamer carbonate  2,400 mg Oral TID WC   sodium zirconium cyclosilicate  10 g Oral Once per day on Sun Tue Thu Sat   Continuous Infusions:  albumin human       LOS: 165 days    Time spent: 35 mins    Domingue Coltrain, MD [facility]   If 7PM-7AM, please contact night-coverage

## 2022-02-07 NOTE — Progress Notes (Signed)
[facility] Progress Note   Subjective:  Seen in room - feels fine today, no CP/dyspnea. For HD later today.  Objective Vitals:   02/06/22 1720 02/06/22 2021 02/07/22 0534 02/07/22 0909  BP: (!) 150/55 (!) 154/56 (!) 180/60 (!) 168/60  Pulse: 63 65 (!) 58 (!) 55  Resp: 18   17  Temp: 98.7 F (37.1 C) 98.2 F (36.8 C) 98.4 F (36.9 C) 98.1 F (36.7 C)  TempSrc: Oral Oral Oral Oral  SpO2: 97% 95% 100% 98%  Weight:      Height:       Physical Exam General: Well appearing woman, NAD. Room air Heart: RRR; no murmur Lungs: CTAB; no rales Abdomen: soft Extremities: L BKA, 1+ stump edema and distal RLE edema Dialysis Access: LUE AVF + bruit  Additional Objective Labs: Basic Metabolic Panel: Recent Labs  Lab 02/03/22 0825 02/05/22 0856 02/06/22 0712  NA 128* 126* 129*  K 5.3* 5.0 4.7  CL 90* 88* 90*  CO2 23 24 25   GLUCOSE 216* 188* 120*  BUN 107* 104* 67*  CREATININE 7.35* 6.85* 5.50*  CALCIUM 8.8* 8.7* 9.2  PHOS 6.4* 6.4* 5.5*   Liver Function Tests: Recent Labs  Lab 02/01/22 1707 02/03/22 0825 02/05/22 0856  ALBUMIN 3.7 3.6 3.6   CBC: Recent Labs  Lab 02/03/22 0825 02/05/22 0856 02/06/22 0712  WBC 7.2 8.0 7.9  NEUTROABS 4.2  --   --   HGB 10.8* 11.5* 12.3  HCT 33.7* 33.5* 38.6  MCV 92.1 90.3 92.6  PLT 234 237 240   Medications:  albumin human      (feeding supplement) PROSource Plus  30 mL Oral BID BM   acidophilus  1 capsule Oral Daily   alosetron  1 mg Oral BID   amitriptyline  50 mg Oral QHS   atorvastatin  40 mg Oral q1800   carvedilol  3.125 mg Oral BID   docusate sodium  200 mg Oral BID   DULoxetine  60 mg Oral Daily   famotidine  20 mg Oral Daily   fentaNYL  1 patch Transdermal Q72H   furosemide  80 mg Oral Daily   heparin  4,000 Units Dialysis Once in dialysis   hydrALAZINE  50 mg Oral TID   insulin aspart  0-9 Units Subcutaneous TID WC   insulin aspart  6 Units Subcutaneous TID WC   insulin glargine-yfgn  10  Units Subcutaneous QHS   methocarbamol  500 mg Oral QID   multivitamin  1 tablet Oral QHS   pantoprazole  80 mg Oral Daily   polyethylene glycol  17 g Oral Daily   sevelamer carbonate  2,400 mg Oral TID WC   sodium zirconium cyclosilicate  10 g Oral Once per day on Sun Tue Thu Sat    Dialysis Orders: MWF at [place], 400/500, EDW 90kg, 2K/2Ca, LUE AVF - heparin 4000 unit IV bolus, no ESA   Assessment/Plan: 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 Completed 6 wk course of IV Daptomycin on 10/12/21. Per Dr. Jess Barters note 12/28/2021 "NO RESTRICTIONS L HIP".  Chronic L hip/residual limb pain: Hx fractures of L femoral neck (where hardware was removed) as well as distal femur Fx. Pain control per primary.  ESRD: Usual MWF schedule. Clots HD system without heparin, 4000 unit bolus and 2000 unit mid-run bolus. Refused HD last week d/t not liking the timing of it - managers aware and have discussed with patient. HD today, slightly  lower goal d/t cramping last time - still with edema. Hyperkalemia: Stable on scheduled lokelma 4 days a week. HTN/volume: BP meds have been tapered down - currently on Coreg 3.125mg  BID only. Large fluids gains between HD which has been discussed extensively, Ongoing edema, UF prn. Anemia of ESRD: Hgb 12.3. Last tsat 54%. Aranesp on hold. Secondary HPTH: CorrCa ok, no VDRA. Phos improving, continue Renvela as binder. Nutrition: Alb low, continue protein supplements.  Renal diet w/fluid restrictions.   AVF dysfunction (resolved): S/p branch ligation 09/19/2021  Dr. Virl Cagey. AVF working well now. DM2: Insulin per primary Dispo: SNF previously recommended, but she declined. Not home HD candidate.  Dialyzed in recliner on 9/4 but signed off early due to pain, has declined to come in a recliner since then.  Strongly discussed on the importance of increasing her mobility as much as possible while she is in her room in addition to  following her ongoing fluid restriction. Also reinforced the importance of being in the chair for HD treatments to improve strength and mobility. Does not like to do PT after HD, will try to do HD in the afternoons so she can do PT in the AM. Per Renal Navigator notes there are no clinics that will accept patient unless she will have HD in chair. Will continue to encourage use of chair for HD.  Veneta Penton, PA-C 02/07/2022, 10:40 AM  [employer]

## 2022-02-08 DIAGNOSIS — M86252 Subacute osteomyelitis, left femur: Secondary | ICD-10-CM | POA: Diagnosis not present

## 2022-02-08 LAB — GLUCOSE, CAPILLARY
Glucose-Capillary: 131 mg/dL — ABNORMAL HIGH (ref 70–99)
Glucose-Capillary: 189 mg/dL — ABNORMAL HIGH (ref 70–99)
Glucose-Capillary: 178 mg/dL — ABNORMAL HIGH (ref 70–99)
Glucose-Capillary: 126 mg/dL — ABNORMAL HIGH (ref 70–99)

## 2022-02-08 NOTE — Progress Notes (Signed)
PROGRESS NOTE    [name]  GBT:[id number] DOB: 06-Sep-1965 DOA: 08/26/2021 PCP: [name], MD   Brief Narrative:  This 56 year old female with PMH significant of hypertension, hyperlipidemia, diabetes mellitus type 2, end-stage renal disease on hemodialysis, status post left BKA for chronic calcaneus osteomyelitis on 01/12/2020, bipolar disorder, left distal femoral fracture after a fall on 07/19/2021 deemed  nonoperative, admission from 08/03/2021-08/23/2021 for left femoral osteomyelitis requiring partial resection of femur and rearrangements of bony fragments with subsequent antibiotic treatment presented to the hospital with left hip bleeding and was found to have subacute osteomyelitis and abscess of left femur.  Orthopedic/ID were consulted and patient was started on broad-spectrum antibiotics.  Patient underwent multiple I&D's by Dr. [name] along with local tissue rearrangement.  Patient was also seen by vascular surgery who did fistula branch ligation.  Patient has completed 6-week course of daptomycin on 10/09/2021.  She has been unable to complete hemodialysis in chair and outpatient hemodialysis unit has been hard to find.  She is otherwise medically stable for discharge.   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  Active Problems:   ESRD (end stage renal disease) on dialysis ([facility])   Type 2 diabetes mellitus with hyperlipidemia (HCC)   History of CVA (cerebrovascular accident)   Essential hypertension   Anxiety and depression   GASTROESOPHAGEAL REFLUX, NO ESOPHAGITIS   TOBACCO DEPENDENCE   Hyponatremia   Obesity (BMI 30.0-34.9)   Unilateral amputation of left foot (HCC)   Wound infection   History of CVA (cerebrovascular accident) without residual deficits   Hyperkalemia   Wound dehiscence, surgical, sequela  Subacute osteomyelitis of left femur with abscess: Status post surgical intervention by Dr. [name] with multiple left hip  debridements.   She has completed a 6-week course of daptomycin on 10/09/2021.    Re-imaging on 12/02/2021 of left knee showed healing fracture with callus.   Orthopedics Dr [name] has followed the patient during hospitalization and is doing well.   No left hip restrictions at this time.  Continue fentanyl patch, oxycodone and Robaxin.   Chronic pain syndrome:  Continue  fentanyl patch, oxycodone, Robaxin and amitriptyline.     Right thigh cellulitis: Treated with antibiotics and resolved.   History of left intertrochanteric fracture: Left hip hardware removed on 08/10/2021 prior to admission.   Physical therapy working to scoot and mobilize with wheelchair.   She will need to tolerate sitting for at least 4 hours to be able to be considered for outpatient hemodialysis.   She has been encouraged to participate more with physical therapy..     End-stage renal disease on hemodialysis: Nephrology on board for hemodialysis needs.   Awaiting for hemodialysis tolerance in a chair.   Hyponatremia: Sodium at 129.  She remains asymptomatic. Continue to monitor BMP periodically.   Borderline hyperkalemia.   Latest potassium 5.2 on 02/07/2022.   Continue Lokelma as needed. Potassium normalized > 4.7   Hyperphosphatemia: continue sevelamer.  Hypomagnesemia: Replaced. Continue to monitor   Anemia of chronic disease: Latest hemoglobin 12.3.  Continue to monitor.   Diabetes mellitus type 2 with hyperglycemia: Last hemoglobin A1c of 5.9 on 11/14/2021.   Continue mealtime sliding scale and long-acting insulin.   Closely monitor.  Latest POC glucose of 116   Essential hypertension: Continue Coreg, hydralazine, Lasix.   Blood pressure reasonably controlled.   History of unspecified CVA Hyperlipidemia Continue Lipitor.   GERD Continue Protonix and Pepcid.  Tobacco use Stable at this time. Not interested in quit smoking.   Anxiety and depression Continue amitriptyline and  Cymbalta.   DVT prophylaxis: SCDs Code Status: Full code. Family Communication: No family at bed side. Disposition Plan:  Status is: Inpatient Remains inpatient appropriate because: Uncertain at this time.  TOC involved.  Pending disposition , Pending tolerance of dialysis in chair.   Difficult disposition  Consultants:  Nephrology Vascular surgery Orthopedic Infectious disease  Procedures:  Multiple I&D's with tissue arrangement. Fistula branch ligation. Hemodialysis.  Antimicrobials:  Ceftriaxone 10/15 - 10/18 Doxycycline 10/14 - 10/14 Cefazolin 7/1 - 7/1, 7/20 - 7/20 Daptomycin 6/29 - 8/11  Subjective: Patient was seen and examined at bedside.Overnight events noted. She reports having cramping in the left arm after hemodialysis. She is trying to do more physical therapy. Patient is not able to sit in the chair long enough to complete dialysis.  Objective: Vitals:   02/07/22 1819 02/07/22 2117 02/07/22 2227 02/08/22 0945  BP: (!) 150/67 (!) 145/52 (!) 149/52 (!) 155/62  Pulse: 64 70 70 67  Resp: 13 18 18 16   Temp: 98.1 F (36.7 C) 98 F (36.7 C) 98.1 F (36.7 C) 98.4 F (36.9 C)  TempSrc: Oral Oral Oral Oral  SpO2: 98% 98% 97% 98%  Weight: 93.5 kg     Height:        Intake/Output Summary (Last 24 hours) at 02/08/2022 1101 Last data filed at 02/07/2022 2023 Gross per 24 hour  Intake 220 ml  Output 3500 ml  Net -3280 ml   Filed Weights   02/05/22 0850 02/07/22 1350 02/07/22 1819  Weight: 95.9 kg 97 kg 93.5 kg    Examination:  General exam: Appears comfortable, not in any acute distress.  Deconditioned. Respiratory system: CTA bilaterally, respiratory effort normal, RR 13. Cardiovascular system: S1 & S2 heard, regular rate and rhythm, no murmur. Gastrointestinal system: Abdomen is soft, non tender, non distended, BS+ Central nervous system: Alert and oriented x 3, no focal neurological deficits. Extremities: Left below-knee amputation, left hip with  healed surgical scar. Skin: No rashes, lesions or ulcers Psychiatry: Judgement and insight appear normal. Mood & affect appropriate.     Data Reviewed: I have personally reviewed following labs and imaging studies  CBC: Recent Labs  Lab 02/03/22 0825 02/05/22 0856 02/06/22 0712 02/07/22 1233  WBC 7.2 8.0 7.9 6.1  NEUTROABS 4.2  --   --   --   HGB 10.8* 11.5* 12.3 12.1  HCT 33.7* 33.5* 38.6 36.2  MCV 92.1 90.3 92.6 89.4  PLT 234 237 240 062   Basic Metabolic Panel: Recent Labs  Lab 02/01/22 1707 02/03/22 0825 02/05/22 0856 02/06/22 0712 02/07/22 1233  NA 130* 128* 126* 129* 129*  K 4.9 5.3* 5.0 4.7 5.2*  CL 92* 90* 88* 90* 90*  CO2 26 23 24 25 23   GLUCOSE 251* 216* 188* 120* 113*  BUN 70* 107* 104* 67* 101*  CREATININE 5.78* 7.35* 6.85* 5.50* 6.73*  CALCIUM 8.9 8.8* 8.7* 9.2 9.4  MG  --   --   --  1.6*  --   PHOS 4.9* 6.4* 6.4* 5.5* 6.2*   GFR: Estimated Creatinine Clearance: 11.8 mL/min (A) (by C-G formula based on SCr of 6.73 mg/dL (H)). Liver Function Tests: Recent Labs  Lab 02/01/22 1707 02/03/22 0825 02/05/22 0856 02/07/22 1233  ALBUMIN 3.7 3.6 3.6 3.9   No results for input(s): "LIPASE", "AMYLASE" in the last 168 hours. No results for input(s): "AMMONIA" in the last  168 hours. Coagulation Profile: No results for input(s): "INR", "PROTIME" in the last 168 hours. Cardiac Enzymes: No results for input(s): "CKTOTAL", "CKMB", "CKMBINDEX", "TROPONINI" in the last 168 hours. BNP (last 3 results) No results for input(s): "PROBNP" in the last 8760 hours. HbA1C: No results for input(s): "HGBA1C" in the last 72 hours. CBG: Recent Labs  Lab 02/06/22 2125 02/07/22 0718 02/07/22 1147 02/07/22 2227 02/08/22 0724  GLUCAP 179* 167* 122* 277* 131*   Lipid Profile: No results for input(s): "CHOL", "HDL", "LDLCALC", "TRIG", "CHOLHDL", "LDLDIRECT" in the last 72 hours. Thyroid Function Tests: No results for input(s): "TSH", "T4TOTAL", "FREET4", "T3FREE",  "THYROIDAB" in the last 72 hours. Anemia Panel: No results for input(s): "VITAMINB12", "FOLATE", "FERRITIN", "TIBC", "IRON", "RETICCTPCT" in the last 72 hours. Sepsis Labs: No results for input(s): "PROCALCITON", "LATICACIDVEN" in the last 168 hours.  No results found for this or any previous visit (from the past 240 hour(s)).  Radiology Studies: No results found.  Scheduled Meds:  (feeding supplement) PROSource Plus  30 mL Oral BID BM   acidophilus  1 capsule Oral Daily   alosetron  1 mg Oral BID   amitriptyline  50 mg Oral QHS   atorvastatin  40 mg Oral q1800   carvedilol  3.125 mg Oral BID   docusate sodium  200 mg Oral BID   DULoxetine  60 mg Oral Daily   famotidine  20 mg Oral Daily   fentaNYL  1 patch Transdermal Q72H   furosemide  80 mg Oral Daily   heparin sodium (porcine)  2,000 Units Intravenous Once   hydrALAZINE  50 mg Oral TID   insulin aspart  0-9 Units Subcutaneous TID WC   insulin aspart  6 Units Subcutaneous TID WC   insulin glargine-yfgn  10 Units Subcutaneous QHS   methocarbamol  500 mg Oral QID   multivitamin  1 tablet Oral QHS   pantoprazole  80 mg Oral Daily   polyethylene glycol  17 g Oral Daily   sevelamer carbonate  2,400 mg Oral TID WC   sodium zirconium cyclosilicate  10 g Oral Once per day on Sun Tue Thu Sat   Continuous Infusions:  albumin human       LOS: 166 days    Time spent: 35 mins    Kaeden Mester, MD [facility]   If 7PM-7AM, please contact night-coverage

## 2022-02-08 NOTE — Progress Notes (Signed)
[facility] Progress Note   Subjective:  Seen in room - cramping after HD again yesterday, but not as bad - mostly in L hand. No CP/dyspnea.  Objective Vitals:   02/07/22 1752 02/07/22 1819 02/07/22 2117 02/07/22 2227  BP: (!) 172/59 (!) 150/67 (!) 145/52 (!) 149/52  Pulse: 60 64 70 70  Resp: 15 13 18 18   Temp:  98.1 F (36.7 C) 98 F (36.7 C) 98.1 F (36.7 C)  TempSrc:  Oral Oral Oral  SpO2: 100% 98% 98% 97%  Weight:  93.5 kg    Height:       Physical Exam General: Well appearing woman, NAD. Room air Heart: RRR; no murmur Lungs: CTAB; no rales Abdomen: soft Extremities: L BKA, 1+ stump edema and distal RLE edema Dialysis Access: LUE AVF + br  Additional Objective Labs: Basic Metabolic Panel: Recent Labs  Lab 02/05/22 0856 02/06/22 0712 02/07/22 1233  NA 126* 129* 129*  K 5.0 4.7 5.2*  CL 88* 90* 90*  CO2 24 25 23   GLUCOSE 188* 120* 113*  BUN 104* 67* 101*  CREATININE 6.85* 5.50* 6.73*  CALCIUM 8.7* 9.2 9.4  PHOS 6.4* 5.5* 6.2*   Liver Function Tests: Recent Labs  Lab 02/03/22 0825 02/05/22 0856 02/07/22 1233  ALBUMIN 3.6 3.6 3.9   CBC: Recent Labs  Lab 02/03/22 0825 02/05/22 0856 02/06/22 0712 02/07/22 1233  WBC 7.2 8.0 7.9 6.1  NEUTROABS 4.2  --   --   --   HGB 10.8* 11.5* 12.3 12.1  HCT 33.7* 33.5* 38.6 36.2  MCV 92.1 90.3 92.6 89.4  PLT 234 237 240 208   Medications:  albumin human      (feeding supplement) PROSource Plus  30 mL Oral BID BM   acidophilus  1 capsule Oral Daily   alosetron  1 mg Oral BID   amitriptyline  50 mg Oral QHS   atorvastatin  40 mg Oral q1800   carvedilol  3.125 mg Oral BID   docusate sodium  200 mg Oral BID   DULoxetine  60 mg Oral Daily   famotidine  20 mg Oral Daily   fentaNYL  1 patch Transdermal Q72H   furosemide  80 mg Oral Daily   heparin sodium (porcine)  2,000 Units Intravenous Once   hydrALAZINE  50 mg Oral TID   insulin aspart  0-9 Units Subcutaneous TID WC   insulin aspart  6  Units Subcutaneous TID WC   insulin glargine-yfgn  10 Units Subcutaneous QHS   methocarbamol  500 mg Oral QID   multivitamin  1 tablet Oral QHS   pantoprazole  80 mg Oral Daily   polyethylene glycol  17 g Oral Daily   sevelamer carbonate  2,400 mg Oral TID WC   sodium zirconium cyclosilicate  10 g Oral Once per day on Sun Tue Thu Sat    Dialysis Orders: MWF at [place], 400/500, EDW 90kg, 2K/2Ca, LUE AVF - heparin 4000 unit IV bolus, no ESA   Assessment/Plan: 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 Completed 6 wk course of IV Daptomycin on 10/12/21. Per Dr. Jess Barters note 12/28/2021 "NO RESTRICTIONS L HIP".  Chronic L hip/residual limb pain: Hx fractures of L femoral neck (where hardware was removed) as well as distal femur Fx. Pain control per primary.  ESRD: Usual MWF schedule. Clots HD system without heparin, 4000 unit bolus and 2000 unit mid-run bolus. Refused HD last week d/t not liking the timing  of it - managers aware and have discussed with patient. Next HD Monday 12/11. Hyperkalemia: Stable on scheduled lokelma 4 days a week. HTN/volume: BP meds have been tapered down - currently on Coreg 3.125mg  BID only. Large fluids gains between HD which has been discussed extensively, Ongoing edema, UF prn. Anemia of ESRD: Hgb 12.3. Last tsat 54%. Aranesp on hold. Secondary HPTH: CorrCa ok, no VDRA. Phos improving, continue Renvela as binder. Nutrition: Alb low, continue protein supplements.  Renal diet w/fluid restrictions.   AVF dysfunction (resolved): S/p branch ligation 09/19/2021  Dr. Virl Cagey. AVF working well now. DM2: Insulin per primary Dispo: SNF previously recommended, but she declined. Not home HD candidate.  Dialyzed in recliner on 9/4 but signed off early due to pain, has declined to come in a recliner since then.  Strongly discussed on the importance of increasing her mobility as much as possible while she is in her room in addition  to following her ongoing fluid restriction. Also reinforced the importance of being in the chair for HD treatments to improve strength and mobility. Does not like to do PT after HD, will try to do HD in the afternoons so she can do PT in the AM. Per Renal Navigator notes there are no clinics that will accept patient unless she will have HD in chair. Will continue to encourage use of chair for HD.  Veneta Penton, PA-C 02/08/2022, 9:43 AM  [employer]

## 2022-02-08 NOTE — Progress Notes (Signed)
Dietary order allowing Yougurt: Patient now has a 1200 cc/day fluid restriction. Patient is on dialysis and it is very important that fluids be restricted on a day-to-day basis. AND ICE = FLUID AND MUST BE RECORDED AS FLUIDS ALSO. Please PLACE SIGNS in room and on the door.  Thanks. Patient may have yogurt  Louanne Skye 02/08/22 1:04 PM

## 2022-02-09 DIAGNOSIS — M86252 Subacute osteomyelitis, left femur: Secondary | ICD-10-CM | POA: Diagnosis not present

## 2022-02-09 LAB — GLUCOSE, CAPILLARY
Glucose-Capillary: 135 mg/dL — ABNORMAL HIGH (ref 70–99)
Glucose-Capillary: 200 mg/dL — ABNORMAL HIGH (ref 70–99)
Glucose-Capillary: 131 mg/dL — ABNORMAL HIGH (ref 70–99)
Glucose-Capillary: 148 mg/dL — ABNORMAL HIGH (ref 70–99)

## 2022-02-09 NOTE — Progress Notes (Signed)
PROGRESS NOTE    [name]  FEO:[id number] DOB: 1965/07/01 DOA: 08/26/2021 PCP: [name], MD   Brief Narrative:  This 56 year old female with PMH significant of hypertension, hyperlipidemia, diabetes mellitus type 2, end-stage renal disease on hemodialysis, status post left BKA for chronic calcaneus osteomyelitis on 01/12/2020, bipolar disorder, left distal femoral fracture after a fall on 07/19/2021 deemed  nonoperative, admission from 08/03/2021-08/23/2021 for left femoral osteomyelitis requiring partial resection of femur and rearrangements of bony fragments with subsequent antibiotic treatment presented to the hospital with left hip bleeding and was found to have subacute osteomyelitis and abscess of left femur.  Orthopedic/ID were consulted and patient was started on broad-spectrum antibiotics.  Patient underwent multiple I&D's by Dr. [name] along with local tissue rearrangement.  Patient was also seen by vascular surgery who did fistula branch ligation.  Patient has completed 6-week course of daptomycin on 10/09/2021.  She has been unable to complete hemodialysis in chair and outpatient hemodialysis unit has been hard to find.  She is otherwise medically stable for discharge.   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  Active Problems:   ESRD (end stage renal disease) on dialysis ([place])   Type 2 diabetes mellitus with hyperlipidemia (HCC)   History of CVA (cerebrovascular accident)   Essential hypertension   Anxiety and depression   GASTROESOPHAGEAL REFLUX, NO ESOPHAGITIS   TOBACCO DEPENDENCE   Hyponatremia   Obesity (BMI 30.0-34.9)   Unilateral amputation of left foot (HCC)   Wound infection   History of CVA (cerebrovascular accident) without residual deficits   Hyperkalemia   Wound dehiscence, surgical, sequela  Subacute osteomyelitis of left femur with abscess: Status post surgical intervention by Dr. [name] with multiple left hip  debridements.   She has completed a 6-week course of daptomycin on 10/09/2021.    Re-imaging on 12/02/2021 of left knee showed healing fracture with callus.   Orthopedics Dr [name] has followed the patient during hospitalization and is doing well.   No left hip restrictions at this time.  Continue fentanyl patch, oxycodone and Robaxin.   Chronic pain syndrome:  Continue  fentanyl patch, oxycodone, Robaxin and amitriptyline.     Right thigh cellulitis: Treated with antibiotics and resolved.   History of left intertrochanteric fracture: Left hip hardware was removed on 08/10/2021 prior to admission.   Physical therapy working to scoot and mobilize with wheelchair.   She will need to tolerate sitting in chair for at least 4 hours to be able to be considered for outpatient hemodialysis.   She has been encouraged to participate more with physical therapy..     End-stage renal disease on hemodialysis: Nephrology on board for hemodialysis needs.   Awaiting for hemodialysis tolerance in a chair.   Hyponatremia: Sodium at 129. She remains asymptomatic. Continue to monitor BMP periodically.   Borderline hyperkalemia.   Latest potassium 5.2 on 02/07/2022.   Continue Lokelma as needed. Continue to monitor potassium   Hyperphosphatemia: continue sevelamer.  Hypomagnesemia: Replaced. Continue to monitor   Anemia of chronic disease: Latest hemoglobin 12.1.  Continue to monitor.   Diabetes mellitus type 2 with hyperglycemia: Last hemoglobin A1c of 5.9 on 11/14/2021.   Continue mealtime sliding scale and long-acting insulin.   Closely monitor.  Latest POC glucose of 116   Essential hypertension: Continue Coreg, hydralazine, Lasix.   Blood pressure reasonably controlled.   History of unspecified CVA Hyperlipidemia Continue Lipitor.   GERD Continue Protonix and Pepcid.  Tobacco use Stable at this time. Not interested in quit smoking.   Anxiety and depression Continue amitriptyline  and Cymbalta.   DVT prophylaxis: SCDs Code Status: Full code. Family Communication: No family at bed side. Disposition Plan:  Status is: Inpatient Remains inpatient appropriate because: Uncertain at this time.  TOC involved.  Pending disposition , Pending tolerance of dialysis in chair.   Difficult disposition  Consultants:  Nephrology Vascular surgery Orthopedic Infectious disease  Procedures:  Multiple I&D's with tissue arrangement. Fistula branch ligation. Hemodialysis.  Antimicrobials:  Ceftriaxone 10/15 - 10/18 Doxycycline 10/14 - 10/14 Cefazolin 7/1 - 7/1, 7/20 - 7/20 Daptomycin 6/29 - 8/11  Subjective: Patient was seen and examined at bedside.Overnight events noted. Patient reports doing much better, states having cramps after dialysis. She is encouraged to do more physical therapy. Patient is not able to sit in the chair long enough to complete dialysis.  Objective: Vitals:   02/08/22 1635 02/08/22 2110 02/09/22 0641 02/09/22 0903  BP: (!) 151/51 (!) 139/53 104/60 (!) 184/61  Pulse: 64 61 69 60  Resp: 18 16 17    Temp: 98.4 F (36.9 C) 98.8 F (37.1 C) 98.2 F (36.8 C) 97.7 F (36.5 C)  TempSrc: Oral Oral Oral Oral  SpO2: 99% 98% 99% 100%  Weight:      Height:        Intake/Output Summary (Last 24 hours) at 02/09/2022 1043 Last data filed at 02/09/2022 0900 Gross per 24 hour  Intake 720 ml  Output 0 ml  Net 720 ml   Filed Weights   02/05/22 0850 02/07/22 1350 02/07/22 1819  Weight: 95.9 kg 97 kg 93.5 kg    Examination:  General exam: Deconditioned but comfortable, not in any acute distress. Respiratory system: CTA bilaterally, respiratory effort normal, RR 15. Cardiovascular system: S1 & S2 heard, regular rate and rhythm, no murmur. Gastrointestinal system: Abdomen is soft, non tender, non distended, BS+ Central nervous system: Alert and oriented x 3, no focal neurological deficits. Extremities: Left hip with healed surgical scar, left  below-knee amputation. Skin: No rashes, lesions or ulcers Psychiatry: Judgement and insight appear normal. Mood & affect appropriate.     Data Reviewed: I have personally reviewed following labs and imaging studies  CBC: Recent Labs  Lab 02/03/22 0825 02/05/22 0856 02/06/22 0712 02/07/22 1233  WBC 7.2 8.0 7.9 6.1  NEUTROABS 4.2  --   --   --   HGB 10.8* 11.5* 12.3 12.1  HCT 33.7* 33.5* 38.6 36.2  MCV 92.1 90.3 92.6 89.4  PLT 234 237 240 824   Basic Metabolic Panel: Recent Labs  Lab 02/03/22 0825 02/05/22 0856 02/06/22 0712 02/07/22 1233  NA 128* 126* 129* 129*  K 5.3* 5.0 4.7 5.2*  CL 90* 88* 90* 90*  CO2 23 24 25 23   GLUCOSE 216* 188* 120* 113*  BUN 107* 104* 67* 101*  CREATININE 7.35* 6.85* 5.50* 6.73*  CALCIUM 8.8* 8.7* 9.2 9.4  MG  --   --  1.6*  --   PHOS 6.4* 6.4* 5.5* 6.2*   GFR: Estimated Creatinine Clearance: 11.8 mL/min (A) (by C-G formula based on SCr of 6.73 mg/dL (H)). Liver Function Tests: Recent Labs  Lab 02/03/22 0825 02/05/22 0856 02/07/22 1233  ALBUMIN 3.6 3.6 3.9   No results for input(s): "LIPASE", "AMYLASE" in the last 168 hours. No results for input(s): "AMMONIA" in the last 168 hours. Coagulation Profile: No results for input(s): "INR", "PROTIME" in the last 168 hours. Cardiac Enzymes: No results for  input(s): "CKTOTAL", "CKMB", "CKMBINDEX", "TROPONINI" in the last 168 hours. BNP (last 3 results) No results for input(s): "PROBNP" in the last 8760 hours. HbA1C: No results for input(s): "HGBA1C" in the last 72 hours. CBG: Recent Labs  Lab 02/08/22 0724 02/08/22 1144 02/08/22 1634 02/08/22 2116 02/09/22 0725  GLUCAP 131* 189* 178* 126* 135*   Lipid Profile: No results for input(s): "CHOL", "HDL", "LDLCALC", "TRIG", "CHOLHDL", "LDLDIRECT" in the last 72 hours. Thyroid Function Tests: No results for input(s): "TSH", "T4TOTAL", "FREET4", "T3FREE", "THYROIDAB" in the last 72 hours. Anemia Panel: No results for input(s):  "VITAMINB12", "FOLATE", "FERRITIN", "TIBC", "IRON", "RETICCTPCT" in the last 72 hours. Sepsis Labs: No results for input(s): "PROCALCITON", "LATICACIDVEN" in the last 168 hours.  No results found for this or any previous visit (from the past 240 hour(s)).  Radiology Studies: No results found.  Scheduled Meds:  (feeding supplement) PROSource Plus  30 mL Oral BID BM   acidophilus  1 capsule Oral Daily   alosetron  1 mg Oral BID   amitriptyline  50 mg Oral QHS   atorvastatin  40 mg Oral q1800   carvedilol  3.125 mg Oral BID   docusate sodium  200 mg Oral BID   DULoxetine  60 mg Oral Daily   famotidine  20 mg Oral Daily   fentaNYL  1 patch Transdermal Q72H   furosemide  80 mg Oral Daily   heparin sodium (porcine)  2,000 Units Intravenous Once   hydrALAZINE  50 mg Oral TID   insulin aspart  0-9 Units Subcutaneous TID WC   insulin aspart  6 Units Subcutaneous TID WC   insulin glargine-yfgn  10 Units Subcutaneous QHS   methocarbamol  500 mg Oral QID   multivitamin  1 tablet Oral QHS   pantoprazole  80 mg Oral Daily   polyethylene glycol  17 g Oral Daily   sevelamer carbonate  2,400 mg Oral TID WC   sodium zirconium cyclosilicate  10 g Oral Once per day on Sun Tue Thu Sat   Continuous Infusions:  albumin human       LOS: 167 days    Time spent: 35 mins    Michaell Grider, MD [facility]   If 7PM-7AM, please contact night-coverage

## 2022-02-09 NOTE — Progress Notes (Signed)
Renal Quick Note  Pt will not be seen today - 12/10. Please page on-call PA with any renal/dialysis concerns.  Veneta Penton, PA-C [employer] Pager [phone]

## 2022-02-10 LAB — BASIC METABOLIC PANEL
Anion gap: 15 (ref 5–15)
BUN: 115 mg/dL — ABNORMAL HIGH (ref 6–20)
CO2: 23 mmol/L (ref 22–32)
Calcium: 8.7 mg/dL — ABNORMAL LOW (ref 8.9–10.3)
Chloride: 87 mmol/L — ABNORMAL LOW (ref 98–111)
Creatinine, Ser: 6.86 mg/dL — ABNORMAL HIGH (ref 0.44–1.00)
GFR, Estimated: 7 mL/min — ABNORMAL LOW (ref >60)
Glucose, Bld: 197 mg/dL — ABNORMAL HIGH (ref 70–99)
Potassium: 5.2 mmol/L — ABNORMAL HIGH (ref 3.5–5.1)
Sodium: 125 mmol/L — ABNORMAL LOW (ref 135–145)

## 2022-02-10 LAB — CBC
HCT: 34 % — ABNORMAL LOW (ref 36.0–46.0)
Hemoglobin: 11.1 g/dL — ABNORMAL LOW (ref 12.0–15.0)
MCH: 29.7 pg (ref 26.0–34.0)
MCHC: 32.6 g/dL (ref 30.0–36.0)
MCV: 90.9 fL (ref 80.0–100.0)
Platelets: 210 K/uL (ref 150–400)
RBC: 3.74 MIL/uL — ABNORMAL LOW (ref 3.87–5.11)
RDW: 14 % (ref 11.5–15.5)
WBC: 7.2 K/uL (ref 4.0–10.5)
nRBC: 0 % (ref 0.0–0.2)

## 2022-02-10 LAB — MAGNESIUM: Magnesium: 1.9 mg/dL (ref 1.7–2.4)

## 2022-02-10 LAB — PHOSPHORUS: Phosphorus: 6.6 mg/dL — ABNORMAL HIGH (ref 2.5–4.6)

## 2022-02-10 LAB — GLUCOSE, CAPILLARY
Glucose-Capillary: 151 mg/dL — ABNORMAL HIGH (ref 70–99)
Glucose-Capillary: 77 mg/dL (ref 70–99)
Glucose-Capillary: 296 mg/dL — ABNORMAL HIGH (ref 70–99)

## 2022-02-10 NOTE — Progress Notes (Signed)
PROGRESS NOTE    [name]  YKD:[id number] DOB: 1965-12-11 DOA: 08/26/2021 PCP: [name], MD   Brief Narrative:  This 56 year old female with PMH significant of hypertension, hyperlipidemia, diabetes mellitus type 2, end-stage renal disease on hemodialysis, status post left BKA for chronic calcaneus osteomyelitis on 01/12/2020, bipolar disorder, left distal femoral fracture after a fall on 07/19/2021 deemed  nonoperative, admission from 08/03/2021-08/23/2021 for left femoral osteomyelitis requiring partial resection of femur and rearrangements of bony fragments with subsequent antibiotic treatment presented to the hospital with left hip bleeding and was found to have subacute osteomyelitis and abscess of left femur.  Orthopedic/ID were consulted and patient was started on broad-spectrum antibiotics.  Patient underwent multiple I&D's by Dr. [name] along with local tissue rearrangement.  Patient was also seen by vascular surgery who did fistula branch ligation.  Patient has completed 6-week course of daptomycin on 10/09/2021.  She has been unable to complete hemodialysis in chair and outpatient hemodialysis unit has been hard to find. She is otherwise medically stable for discharge.   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  Active Problems:   ESRD (end stage renal disease) on dialysis ([place])   Type 2 diabetes mellitus with hyperlipidemia (HCC)   History of CVA (cerebrovascular accident)   Essential hypertension   Anxiety and depression   GASTROESOPHAGEAL REFLUX, NO ESOPHAGITIS   TOBACCO DEPENDENCE   Hyponatremia   Obesity (BMI 30.0-34.9)   Unilateral amputation of left foot (HCC)   Wound infection   History of CVA (cerebrovascular accident) without residual deficits   Hyperkalemia   Wound dehiscence, surgical, sequela  Subacute osteomyelitis of left femur with abscess: Status post surgical intervention by Dr. [name] with multiple left hip  debridements.   She has completed a 6-week course of daptomycin on 10/09/2021.    Re-imaging on 12/02/2021 of left knee showed healing fracture with callus.   Orthopedics Dr [name] has followed the patient during hospitalization and is doing well.   No left hip restrictions at this time.  Continue fentanyl patch, oxycodone and Robaxin.   Chronic pain syndrome:  Continue  fentanyl patch, oxycodone, Robaxin and amitriptyline.     Right thigh cellulitis: Treated with antibiotics and resolved.   History of left intertrochanteric fracture: Left hip hardware was removed on 08/10/2021 prior to admission.   Physical therapy working to scoot and mobilize with wheelchair.   She will need to tolerate sitting in chair for at least 4 hours to be able to be considered for outpatient hemodialysis.   She has been encouraged to participate more with physical therapy..     End-stage renal disease on hemodialysis: Nephrology on board for hemodialysis needs.   Awaiting for hemodialysis tolerance in a chair.   Hyponatremia: Sodium at 129. She remains asymptomatic. Continue to monitor BMP periodically.   Borderline hyperkalemia.   Latest potassium 5.2 on 02/07/2022.   Continue Lokelma as needed. Continue to monitor potassium   Hyperphosphatemia: continue sevelamer.  Hypomagnesemia: Replaced. Continue to monitor   Anemia of chronic disease: Latest hemoglobin 12.1.  Continue to monitor.   Diabetes mellitus type 2 with hyperglycemia: Last hemoglobin A1c of 5.9 on 11/14/2021.   Continue mealtime sliding scale and long-acting insulin.   Closely monitor.  Latest POC glucose of 116   Essential hypertension: Continue Coreg, hydralazine, Lasix.   Blood pressure reasonably controlled.   History of unspecified CVA Hyperlipidemia Continue Lipitor.   GERD Continue Protonix and Pepcid.  Tobacco use Stable at this time. Not interested in quit smoking.   Anxiety and depression Continue amitriptyline  and Cymbalta.   DVT prophylaxis: SCDs Code Status: Full code. Family Communication: No family at bed side. Disposition Plan:  Status is: Inpatient Remains inpatient appropriate because: Uncertain at this time.  TOC involved.  Pending disposition , Pending tolerance of dialysis in chair.   Difficult disposition  Consultants:  Nephrology Vascular surgery Orthopedic Infectious disease  Procedures:  Multiple I&D's with tissue arrangement. Fistula branch ligation. Hemodialysis.  Antimicrobials:  Ceftriaxone 10/15 - 10/18 Doxycycline 10/14 - 10/14 Cefazolin 7/1 - 7/1, 7/20 - 7/20 Daptomycin 6/29 - 8/11  Subjective: Patient was seen and examined at bedside.Overnight events noted. Patient has been refusing physical therapy on hemodialysis days. Patient is not able to sit in the chair long enough to complete dialysis.  Objective: Vitals:   02/09/22 1739 02/09/22 2054 02/10/22 0514 02/10/22 0917  BP: (!) 163/66 (!) 156/64 (!) 150/55 (!) 164/65  Pulse: 61 66 64 (!) 59  Resp:    18  Temp: 99 F (37.2 C) 99.1 F (37.3 C) 98.4 F (36.9 C) 98.3 F (36.8 C)  TempSrc: Oral Oral Oral Oral  SpO2: 100% 98% 99% 100%  Weight:      Height:        Intake/Output Summary (Last 24 hours) at 02/10/2022 1205 Last data filed at 02/10/2022 0858 Gross per 24 hour  Intake 360 ml  Output 500 ml  Net -140 ml   Filed Weights   02/05/22 0850 02/07/22 1350 02/07/22 1819  Weight: 95.9 kg 97 kg 93.5 kg    Examination:  General exam: Very deconditioned, but comfortable, not in any acute distress. Respiratory system: CTA bilaterally, respiratory effort normal, RR 13. Cardiovascular system: S1 & S2 heard, regular rate and rhythm, no murmur. Gastrointestinal system: Abdomen is soft, non tender, non distended, BS+ Central nervous system: Alert and oriented x 3, no focal neurological deficits. Extremities: Left hip with healed surgical scar, left below-knee amputation. Skin: No rashes,  lesions or ulcers Psychiatry: Judgement and insight appear normal. Mood & affect appropriate.     Data Reviewed: I have personally reviewed following labs and imaging studies  CBC: Recent Labs  Lab 02/05/22 0856 02/06/22 0712 02/07/22 1233  WBC 8.0 7.9 6.1  HGB 11.5* 12.3 12.1  HCT 33.5* 38.6 36.2  MCV 90.3 92.6 89.4  PLT 237 240 470   Basic Metabolic Panel: Recent Labs  Lab 02/05/22 0856 02/06/22 0712 02/07/22 1233  NA 126* 129* 129*  K 5.0 4.7 5.2*  CL 88* 90* 90*  CO2 24 25 23   GLUCOSE 188* 120* 113*  BUN 104* 67* 101*  CREATININE 6.85* 5.50* 6.73*  CALCIUM 8.7* 9.2 9.4  MG  --  1.6*  --   PHOS 6.4* 5.5* 6.2*   GFR: Estimated Creatinine Clearance: 11.8 mL/min (A) (by C-G formula based on SCr of 6.73 mg/dL (H)). Liver Function Tests: Recent Labs  Lab 02/05/22 0856 02/07/22 1233  ALBUMIN 3.6 3.9   No results for input(s): "LIPASE", "AMYLASE" in the last 168 hours. No results for input(s): "AMMONIA" in the last 168 hours. Coagulation Profile: No results for input(s): "INR", "PROTIME" in the last 168 hours. Cardiac Enzymes: No results for input(s): "CKTOTAL", "CKMB", "CKMBINDEX", "TROPONINI" in the last 168 hours. BNP (last 3 results) No results for input(s): "PROBNP" in the last 8760 hours. HbA1C: No results for input(s): "HGBA1C" in the last 72 hours. CBG: Recent Labs  Lab 02/09/22 1140  02/09/22 1533 02/09/22 2053 02/10/22 0728 02/10/22 1116  GLUCAP 200* 131* 148* 151* 77   Lipid Profile: No results for input(s): "CHOL", "HDL", "LDLCALC", "TRIG", "CHOLHDL", "LDLDIRECT" in the last 72 hours. Thyroid Function Tests: No results for input(s): "TSH", "T4TOTAL", "FREET4", "T3FREE", "THYROIDAB" in the last 72 hours. Anemia Panel: No results for input(s): "VITAMINB12", "FOLATE", "FERRITIN", "TIBC", "IRON", "RETICCTPCT" in the last 72 hours. Sepsis Labs: No results for input(s): "PROCALCITON", "LATICACIDVEN" in the last 168 hours.  No results found  for this or any previous visit (from the past 240 hour(s)).  Radiology Studies: No results found.  Scheduled Meds:  (feeding supplement) PROSource Plus  30 mL Oral BID BM   acidophilus  1 capsule Oral Daily   alosetron  1 mg Oral BID   amitriptyline  50 mg Oral QHS   atorvastatin  40 mg Oral q1800   carvedilol  3.125 mg Oral BID   docusate sodium  200 mg Oral BID   DULoxetine  60 mg Oral Daily   famotidine  20 mg Oral Daily   fentaNYL  1 patch Transdermal Q72H   furosemide  80 mg Oral Daily   heparin sodium (porcine)  2,000 Units Intravenous Once   hydrALAZINE  50 mg Oral TID   insulin aspart  0-9 Units Subcutaneous TID WC   insulin aspart  6 Units Subcutaneous TID WC   insulin glargine-yfgn  10 Units Subcutaneous QHS   methocarbamol  500 mg Oral QID   multivitamin  1 tablet Oral QHS   pantoprazole  80 mg Oral Daily   polyethylene glycol  17 g Oral Daily   sevelamer carbonate  2,400 mg Oral TID WC   sodium zirconium cyclosilicate  10 g Oral Once per day on Sun Tue Thu Sat   Continuous Infusions:  albumin human       LOS: 168 days    Time spent: 35 mins    Lamario Mani, MD [facility]   If 7PM-7AM, please contact night-coverage

## 2022-02-10 NOTE — Plan of Care (Signed)
?  Problem: Fluid Volume: ?Goal: Compliance with measures to maintain balanced fluid volume will improve ?Outcome: Progressing ?  ?Problem: Clinical Measurements: ?Goal: Complications related to the disease process, condition or treatment will be avoided or minimized ?Outcome: Progressing ?  ?

## 2022-02-10 NOTE — Progress Notes (Signed)
   02/10/22 1809  Vitals  Temp 98 F (36.7 C)  Pulse Rate (!) 59  Resp 15  BP (!) 191/66  SpO2 100 %  O2 Device Room Air  Weight 95.2 kg  Type of Weight Post-Dialysis  Oxygen Therapy  Patient Activity (if Appropriate) In bed  Post Treatment  Dialyzer Clearance Lightly streaked  Duration of HD Treatment -hour(s) 2.43 hour(s)  Hemodialysis Intake (mL) 0 mL  Liters Processed 59.9  Fluid Removed (mL) 2700 mL  Tolerated HD Treatment Yes  AVG/AVF Arterial Site Held (minutes) 10 minutes  AVG/AVF Venous Site Held (minutes) 10 minutes   Received patient in bed to unit.  Alert and oriented.  Informed consent signed and in chart.   Treatment initiated: 1522 Treatment completed: 3539  Patient tolerated well.  Transported back to the room  Alert, without acute distress.  Hand-off given to patient's nurse.   Access used: LAVF Access issues: none  Total UF removed: 2.7L Medication(s) given: none Tx terminated 1hr 39mins early per pt request AMA signed   [facility]

## 2022-02-10 NOTE — Progress Notes (Cosign Needed)
[facility] Progress Note   Subjective:   Seen in room, no SOB, CP, dizziness, or nausea. HD was planned for this afternoon to give her a chance to work with PT. Today she tells me she is not willing to work with PT at all on dialysis days.   Objective Vitals:   02/09/22 1739 02/09/22 2054 02/10/22 0514 02/10/22 0917  BP: (!) 163/66 (!) 156/64 (!) 150/55 (!) 164/65  Pulse: 61 66 64 (!) 59  Resp:    18  Temp: 99 F (37.2 C) 99.1 F (37.3 C) 98.4 F (36.9 C) 98.3 F (36.8 C)  TempSrc: Oral Oral Oral Oral  SpO2: 100% 98% 99% 100%  Weight:      Height:       Physical Exam General: [name] Heart: RRR, no murmurs, rubs or gallops Lungs: CTA bilaterally without wheezing, rhonchi or rales Abdomen: Soft, non-tender, non-distended, +BS Extremities: 2+ edema L hip, 1+ edema RLE Dialysis Access:  LUE AVF +bruit  Additional Objective Labs: Basic Metabolic Panel: Recent Labs  Lab 02/05/22 0856 02/06/22 0712 02/07/22 1233  NA 126* 129* 129*  K 5.0 4.7 5.2*  CL 88* 90* 90*  CO2 24 25 23   GLUCOSE 188* 120* 113*  BUN 104* 67* 101*  CREATININE 6.85* 5.50* 6.73*  CALCIUM 8.7* 9.2 9.4  PHOS 6.4* 5.5* 6.2*   Liver Function Tests: Recent Labs  Lab 02/05/22 0856 02/07/22 1233  ALBUMIN 3.6 3.9   No results for input(s): "LIPASE", "AMYLASE" in the last 168 hours. CBC: Recent Labs  Lab 02/05/22 0856 02/06/22 0712 02/07/22 1233  WBC 8.0 7.9 6.1  HGB 11.5* 12.3 12.1  HCT 33.5* 38.6 36.2  MCV 90.3 92.6 89.4  PLT 237 240 208   Blood Culture    Component Value Date/Time   SDES TISSUE 08/28/2021 0917   SPECREQUEST LEFT THIGH TISSUE 08/28/2021 0917   CULT  08/28/2021 0917    MODERATE ENTEROCOCCUS FAECIUM VANCOMYCIN RESISTANT ENTEROCOCCUS ISOLATED NO ANAEROBES ISOLATED Sent to [place] for further susceptibility testing. Performed at [facility], [place] [address] [place], [place] [address]    REPTSTATUS 09/02/2021 FINAL 08/28/2021 7793     Cardiac Enzymes: No results for input(s): "CKTOTAL", "CKMB", "CKMBINDEX", "TROPONINI" in the last 168 hours. CBG: Recent Labs  Lab 02/09/22 0725 02/09/22 1140 02/09/22 1533 02/09/22 2053 02/10/22 0728  GLUCAP 135* 200* 131* 148* 151*   Iron Studies: No results for input(s): "IRON", "TIBC", "TRANSFERRIN", "FERRITIN" in the last 72 hours. @lablastinr3 @ Studies/Results: No results found. Medications:  albumin human      (feeding supplement) PROSource Plus  30 mL Oral BID BM   acidophilus  1 capsule Oral Daily   alosetron  1 mg Oral BID   amitriptyline  50 mg Oral QHS   atorvastatin  40 mg Oral q1800   carvedilol  3.125 mg Oral BID   docusate sodium  200 mg Oral BID   DULoxetine  60 mg Oral Daily   famotidine  20 mg Oral Daily   fentaNYL  1 patch Transdermal Q72H   furosemide  80 mg Oral Daily   heparin sodium (porcine)  2,000 Units Intravenous Once   hydrALAZINE  50 mg Oral TID   insulin aspart  0-9 Units Subcutaneous TID WC   insulin aspart  6 Units Subcutaneous TID WC   insulin glargine-yfgn  10 Units Subcutaneous QHS   methocarbamol  500 mg Oral QID   multivitamin  1 tablet Oral QHS  pantoprazole  80 mg Oral Daily   polyethylene glycol  17 g Oral Daily   sevelamer carbonate  2,400 mg Oral TID WC   sodium zirconium cyclosilicate  10 g Oral Once per day on Sun Tue Thu Sat    Dialysis Orders: MWF at [facility], 400/500, EDW 90kg, 2K/2Ca, LUE AVF - heparin 4000 unit IV bolus, no ESA  Assessment/Plan: 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 Completed 6 wk course of IV Daptomycin on 10/12/21. Per Dr. Jess Barters note 12/28/2021 "NO RESTRICTIONS L HIP".  Chronic L hip/residual limb pain: Hx fractures of L femoral neck (where hardware was removed) as well as distal femur Fx. Pain control per primary.  ESRD: Usual MWF schedule. Clots HD system without heparin, 4000 unit bolus and 2000 unit mid-run bolus. Refused HD last  week d/t not liking the timing of it - managers aware and have discussed with patient. Next HD Monday 12/11. Hyperkalemia: Stable on scheduled lokelma 4 days a week. HTN/volume: BP meds have been tapered down - currently on Coreg 3.125mg  BID only. Large fluids gains between HD which has been discussed extensively, Ongoing edema, UF prn. Anemia of ESRD: Hgb 12.3. Last tsat 54%. Aranesp on hold. Secondary HPTH: CorrCa ok, no VDRA. Phos improving, continue Renvela as binder. Nutrition: Alb low, continue protein supplements.  Renal diet w/fluid restrictions.   AVF dysfunction (resolved): S/p branch ligation 09/19/2021  Dr. Virl Cagey. AVF working well now. DM2: Insulin per primary Dispo: SNF previously recommended, but she declined. Not home HD candidate.  Dialyzed in recliner on 9/4 but signed off early due to pain, has declined to come in a recliner since then.  Strongly discussed on the importance of increasing her mobility as much as possible while she is in her room in addition to following her ongoing fluid restriction. Also reinforced the importance of being in the chair for HD treatments to improve strength and mobility. Does not like to do PT after HD, tried to do HD in the afternoons so she can do PT in the AM but she still refused HD today. Per Renal Navigator notes there are no clinics that will accept patient unless she will have HD in chair. Will continue to encourage use of chair for HD.    Anice Paganini, PA-C 02/10/2022, 9:49 AM  [facility] Pager: [phone]

## 2022-02-10 NOTE — Progress Notes (Signed)
PT Cancellation Note  Patient Details Name: [name] MRN: [id number] DOB: [birth year]   Cancelled Treatment:    Reason Eval/Treat Not Completed: Other (comment)  Reports she does not want to work with PT on her HD days;   Pt with memory deficits at baseline, and it appears that she does not remember our conversation in November about trying to add a PT session before HD on Mondays so that she can get more training in to meet her stated goal of independence;   Will continue to follow,   Roney Marion, [place] [facility] [place] 02/10/2022, 10:20 AM

## 2022-02-11 ENCOUNTER — Inpatient Hospital Stay (HOSPITAL_COMMUNITY): Payer: Medicare Other

## 2022-02-11 LAB — GLUCOSE, CAPILLARY
Glucose-Capillary: 108 mg/dL — ABNORMAL HIGH (ref 70–99)
Glucose-Capillary: 181 mg/dL — ABNORMAL HIGH (ref 70–99)
Glucose-Capillary: 154 mg/dL — ABNORMAL HIGH (ref 70–99)
Glucose-Capillary: 132 mg/dL — ABNORMAL HIGH (ref 70–99)

## 2022-02-11 MED ORDER — CHLORHEXIDINE GLUCONATE CLOTH 2 % EX PADS: 6.0000 | MEDICATED_PAD | Freq: Every day | CUTANEOUS | Status: DC

## 2022-02-11 NOTE — Progress Notes (Signed)
Occupational Therapy Treatment Patient Details Name: [name] MRN: [id number] DOB: 1965-07-16 Today's Date: 02/11/2022   History of present illness 56 y.o. female presented to ED 6/26 from dialysis with increased bloody drainage from L hip wound. 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 s/p hardware removal of left hip with partial resection of tuft tissue and femure that were nonviable on 08/10/21 Discharged home 6/23. underwent L hip debridement and wound vac placement 6/28; +Left intertrochanteric non-union,  Fracture of left distal femur  PMH: hypertension, hyperlipidemia, ESRD on HD MWF, history of left BKA in 2021, depression/anxiety, stroke, tobacco use, T2DM,  insomnia, chronic pain syndrome,   OT comments  Pt seen for OT treatment session with focus on ADL's, attempted transfers and pt education on OT goals and plan of care. Goals assessed and updated today as well. Pt declined sliding board transfers from bed to 3:1, w/c and recliner today despite maximal vc's and encouragement. Discussed OT goals and plan of care and pt was agreeable to grooming, bathing UB, rolling right and left as pt was on bedpan upon OT arrival and peri care. Pt is Min assist rolling to left side with use of rails, mod I rolling to right using rails. Set up for grooming & min A UB bathing. Pt also performed UE ex's at bedlevel using red theraband with Min A, vc's and demonstration for proper follow through (bilateral UE elbow flexion, extension, shoulder flexion, extension, horizontal ABD x10 reps x2 sets each).   Recommendations for follow up therapy are one component of a multi-disciplinary discharge planning process, led by the attending physician.  Recommendations may be updated based on patient status, additional functional criteria and insurance authorization.    Follow Up Recommendations  Skilled nursing-short term rehab (<3 hours/day)     Assistance Recommended at  Discharge Intermittent Supervision/Assistance  Patient can return home with the following  A lot of help with bathing/dressing/bathroom;Assistance with cooking/housework;Assist for transportation;Help with stairs or ramp for entrance;Two people to help with walking and/or transfers   Equipment Recommendations  Other (comment) (Defer to next venue)    Recommendations for Other Services      Precautions / Restrictions Precautions Precautions: Fall;Other (comment) Precaution Comments: L BKA (baseline)       Mobility Bed Mobility Overal bed mobility: Needs Assistance Bed Mobility: Rolling Rolling: Min assist, Modified independent (Device/Increase time) (Mod I rolling R using rails, Min A rolling to left using rails and HHA)    Transfers   General transfer comment: Pt declined OOB transfers today including lateral transfers using sliding board from EOB to 3:1, recliner and w/c despite maximal encouragement.     Balance     ADL either performed or assessed with clinical judgement   ADL Overall ADL's : Needs assistance/impaired Eating/Feeding: Set up;Bed level Eating/Feeding Details (indicate cue type and reason): Finishing breakfast upon OT arrival Grooming: Set up;Sitting;Wash/dry face;Oral care;Wash/dry hands Grooming Details (indicate cue type and reason): Sitting up in bed (pt declined sitting up at EOB and/or getting to w/c or recliner) Upper Body Bathing: Minimal assistance;Bed level Upper Body Bathing Details (indicate cue type and reason): Rolling right to left to assist with washing back   Toilet Transfer Details (indicate cue type and reason): Pt declined transfer to 3:1, w/c and recliner. She was Mod i rolling right using rails and Min guard assist rolling left for peri-care and removal of bedpan after RN staff had placed prior to OT arrival Toileting- Water quality scientist and  Hygiene: Minimal assistance;Bed level Toileting - Clothing Manipulation Details (indicate cue  type and reason): Pt declined transfer to 3:1, w/c and recliner. She was Mod i rolling right using rails and Min guard assist rolling left for peri-care and removal of bedpan after RN staff had placed prior to OT arrival   General ADL Comments: Pt declined transfers from bed to 3:1, w/c and recliner today. Discussed OT goals and plan of care and pt was agreeable to grooming, bathing UB, rolling right and left as pt was on bedpan upon OT arrival and peri care. Pt is Min assist rolling to left side with use of rails, mod I rolling to right using rails. Set up for grooming & min A UB bathing. Pt also performed UE ex's at bedlevel using red theraband with Min A, vc's and demonstration for proper follow through (bilateral UE elbow flexion, extension, shoulder flexion, extension, horizontal ABD x10 reps x2 sets each).    Extremity/Trunk Assessment Upper Extremity Assessment Upper Extremity Assessment: Overall WFL for tasks assessed   Lower Extremity Assessment Lower Extremity Assessment: Defer to PT evaluation   Cervical / Trunk Assessment Cervical / Trunk Assessment: Normal    Vision Baseline Vision/History: 1 Wears glasses Patient Visual Report: No change from baseline Vision Assessment?: No apparent visual deficits          Cognition Arousal/Alertness: Awake/alert Behavior During Therapy: WFL for tasks assessed/performed              General Comments      Pertinent Vitals/ Pain       Pain Assessment Pain Assessment: 0-10 Pain Score: 9  Pain Location: L hip, R ankle Pain Descriptors / Indicators: Discomfort, Constant Pain Intervention(s): Limited activity within patient's tolerance, Monitored during session, Repositioned  Home Living  Please refer to initial OT assessment for home information/set-up    Prior Functioning/Environment   Please refer to OT initial assessment for prior level of function   Frequency  Min 2X/week        Progress Toward Goals  OT  Goals(current goals can now be found in the care plan section)  Progress towards OT goals: Progressing toward goals (Goals assessed and updated)  Acute Rehab OT Goals Patient Stated Goal: None OT Goal Formulation: With patient Time For Goal Achievement: 02/25/22 Potential to Achieve Goals: [place] Discharge plan remains appropriate;Frequency remains appropriate       AM-PAC OT "6 Clicks" Daily Activity     Outcome Measure   Help from another person eating meals?: None Help from another person taking care of personal grooming?: A Little Help from another person toileting, which includes using toliet, bedpan, or urinal?: A Lot Help from another person bathing (including washing, rinsing, drying)?: A Lot Help from another person to put on and taking off regular upper body clothing?: A Little Help from another person to put on and taking off regular lower body clothing?: A Lot 6 Click Score: 16    End of Session Equipment Utilized During Treatment: Other (comment) (bed pan removal upon OT arrival)  OT Visit Diagnosis: Muscle weakness (generalized) (M62.81);Unsteadiness on feet (R26.81);History of falling (Z91.81)   Activity Tolerance Patient tolerated treatment well;Other (comment) (Pt limited by activity tolerance and participation)   Patient Left in bed;with call bell/phone within reach   Nurse Communication Mobility status;Other (comment);Patient requests pain meds (Pt declining OOB transfers using sliding board despite max encouragement)        Time: 1914-7829 OT Time Calculation (min): 27 min  Charges: OT General Charges $OT Visit: 1 Visit OT Treatments $Self Care/Home Management : 8-22 mins $Therapeutic Exercise: 8-22 mins   Emmamae Mcnamara Beth Dixon, OTR/L 02/11/2022, 10:28 AM

## 2022-02-11 NOTE — Progress Notes (Addendum)
Mobility Specialist Progress Note:     02/11/22 1216  Mobility  Activity Turned to back - supine (EOB> Supine)  Level of Assistance Contact guard assist, steadying assist  Activity Response Tolerated well  Mobility Referral Yes  $Mobility charge 1 Mobility    Pt received post-PT session, ready to get back to supine. Tolerated well. Left in bed with all needs met.  Royetta Crochet Mobility Specialist Please contact via Solicitor or  Rehab office at [phone]

## 2022-02-11 NOTE — Progress Notes (Signed)
PROGRESS NOTE    GAE BIHL  HWT:[id number] DOB: 06-09-1965 DOA: 08/26/2021 PCP: [name], MD   Brief Narrative:  This 56 year old female with PMH significant of hypertension, hyperlipidemia, diabetes mellitus type 2, end-stage renal disease on hemodialysis, status post left BKA for chronic calcaneus osteomyelitis on 01/12/2020, bipolar disorder, left distal femoral fracture after a fall on 07/19/2021 deemed  nonoperative, admission from 08/03/2021-08/23/2021 for left femoral osteomyelitis requiring partial resection of femur and rearrangements of bony fragments with subsequent antibiotic treatment presented to the hospital with left hip bleeding and was found to have subacute osteomyelitis and abscess of left femur.  Orthopedic/ID were consulted and patient was started on broad-spectrum antibiotics.  Patient underwent multiple I&D's by Dr. [name] along with local tissue rearrangement.  Patient was also seen by vascular surgery who did fistula branch ligation.  Patient has completed 6-week course of daptomycin on 10/09/2021.  She has been unable to complete hemodialysis in chair and outpatient hemodialysis unit has been hard to find. She is otherwise medically stable for discharge.   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  Active Problems:   ESRD (end stage renal disease) on dialysis ([place])   Type 2 diabetes mellitus with hyperlipidemia (HCC)   History of CVA (cerebrovascular accident)   Essential hypertension   Anxiety and depression   GASTROESOPHAGEAL REFLUX, NO ESOPHAGITIS   TOBACCO DEPENDENCE   Hyponatremia   Obesity (BMI 30.0-34.9)   Unilateral amputation of left foot (HCC)   Wound infection   History of CVA (cerebrovascular accident) without residual deficits   Hyperkalemia   Wound dehiscence, surgical, sequela  Subacute osteomyelitis of left femur with abscess: Status post surgical intervention by Dr. [name] with multiple left hip  debridements.   She has completed a 6-week course of daptomycin on 10/09/2021.    Re-imaging on 12/02/2021 of left knee showed healing fracture with callus.   Orthopedics Dr [name] has followed the patient during hospitalization and is doing well.   No left hip restrictions at this time.  Continue fentanyl patch, oxycodone and Robaxin.   Chronic pain syndrome:  Continue  fentanyl patch, oxycodone, Robaxin and amitriptyline.     Right thigh cellulitis: Treated with antibiotics and resolved.   History of left intertrochanteric fracture: Left hip hardware was removed on 08/10/2021 prior to admission.   Physical therapy working to scoot and mobilize with wheelchair.   She will need to tolerate sitting in chair for at least 4 hours to be able to be considered for outpatient hemodialysis.   She has been encouraged to participate more with physical therapy..     End-stage renal disease on hemodialysis: Nephrology on board for hemodialysis needs.   Awaiting for hemodialysis tolerance in a chair.   Hyponatremia: Sodium at 125. She remains asymptomatic. Continue to monitor BMP periodically.   Borderline hyperkalemia.   Latest potassium 5.2 on 02/11/2022.   Continue Lokelma as needed. Continue to monitor potassium   Hyperphosphatemia: continue sevelamer.  Hypomagnesemia: Replaced. Continue to monitor   Anemia of chronic disease: Latest hemoglobin 11.1.  Continue to monitor.   Diabetes mellitus type 2 with hyperglycemia: Last hemoglobin A1c of 5.9 on 11/14/2021.   Continue mealtime sliding scale and long-acting insulin.   Closely monitor.  Latest POC glucose of 116   Essential hypertension: Continue Coreg, hydralazine, Lasix.   Blood pressure reasonably controlled.   History of unspecified CVA Hyperlipidemia Continue Lipitor.   GERD Continue Protonix and Pepcid.  Tobacco use Stable at this time. Not interested in quit smoking.   Anxiety and depression Continue  amitriptyline and Cymbalta.   DVT prophylaxis: SCDs Code Status: Full code. Family Communication: No family at bed side. Disposition Plan:  Status is: Inpatient Remains inpatient appropriate because: Uncertain at this time.  TOC involved.  Pending disposition , Pending tolerance of dialysis in chair.   Difficult disposition  Consultants:  Nephrology Vascular surgery Orthopedic Infectious disease  Procedures:  Multiple I&D's with tissue arrangement. Fistula branch ligation. Hemodialysis.  Antimicrobials:  Ceftriaxone 10/15 - 10/18 Doxycycline 10/14 - 10/14 Cefazolin 7/1 - 7/1, 7/20 - 7/20 Daptomycin 6/29 - 8/11  Subjective: Patient was seen and examined at bedside.Overnight events noted. Patient has been refusing physical therapy on hemodialysis days.. Patient is not able to sit in the chair long enough to complete dialysis. She signed off hemodialysis early yesterday because she was hungry.  Objective: Vitals:   02/10/22 1755 02/10/22 1809 02/10/22 2121 02/11/22 0832  BP: (!) 197/70 (!) 191/66 (!) 161/58 (!) 169/66  Pulse: (!) 58 (!) 59 68 63  Resp: 12 15 18 18   Temp:  98 F (36.7 C)  98.5 F (36.9 C)  TempSrc:    Oral  SpO2:  100% 99% 99%  Weight:  95.2 kg 97.2 kg   Height:        Intake/Output Summary (Last 24 hours) at 02/11/2022 1152 Last data filed at 02/11/2022 0811 Gross per 24 hour  Intake 677 ml  Output 2910 ml  Net -2233 ml   Filed Weights   02/10/22 1458 02/10/22 1809 02/10/22 2121  Weight: 97.9 kg 95.2 kg 97.2 kg    Examination:  General exam: Appears comfortable, not in any acute distress. Respiratory system: CTA bilaterally, respiratory effort normal, RR 14 Cardiovascular system: S1 & S2 heard, regular rate and rhythm, no murmur. Gastrointestinal system: Abdomen is Soft, non tender, non distended, BS+ Central nervous system: Alert and oriented x 3, no focal neurological deficits. Extremities: Left hip with healed surgical scar, left  BKA. LUE AVF+ bruit Skin: No rashes, lesions or ulcers Psychiatry: Judgement and insight appear normal. Mood & affect appropriate.     Data Reviewed: I have personally reviewed following labs and imaging studies  CBC: Recent Labs  Lab 02/05/22 0856 02/06/22 0712 02/07/22 1233 02/10/22 1549  WBC 8.0 7.9 6.1 7.2  HGB 11.5* 12.3 12.1 11.1*  HCT 33.5* 38.6 36.2 34.0*  MCV 90.3 92.6 89.4 90.9  PLT 237 240 208 614   Basic Metabolic Panel: Recent Labs  Lab 02/05/22 0856 02/06/22 0712 02/07/22 1233 02/10/22 1549  NA 126* 129* 129* 125*  K 5.0 4.7 5.2* 5.2*  CL 88* 90* 90* 87*  CO2 24 25 23 23   GLUCOSE 188* 120* 113* 197*  BUN 104* 67* 101* 115*  CREATININE 6.85* 5.50* 6.73* 6.86*  CALCIUM 8.7* 9.2 9.4 8.7*  MG  --  1.6*  --  1.9  PHOS 6.4* 5.5* 6.2* 6.6*   GFR: Estimated Creatinine Clearance: 11.8 mL/min (A) (by C-G formula based on SCr of 6.86 mg/dL (H)). Liver Function Tests: Recent Labs  Lab 02/05/22 0856 02/07/22 1233  ALBUMIN 3.6 3.9   No results for input(s): "LIPASE", "AMYLASE" in the last 168 hours. No results for input(s): "AMMONIA" in the last 168 hours. Coagulation Profile: No results for input(s): "INR", "PROTIME" in the last 168 hours. Cardiac Enzymes: No results for input(s): "CKTOTAL", "CKMB", "CKMBINDEX", "TROPONINI" in the last 168 hours. BNP (last 3 results) No results  for input(s): "PROBNP" in the last 8760 hours. HbA1C: No results for input(s): "HGBA1C" in the last 72 hours. CBG: Recent Labs  Lab 02/10/22 0728 02/10/22 1116 02/10/22 2124 02/11/22 0718 02/11/22 1121  GLUCAP 151* 77 296* 108* 181*   Lipid Profile: No results for input(s): "CHOL", "HDL", "LDLCALC", "TRIG", "CHOLHDL", "LDLDIRECT" in the last 72 hours. Thyroid Function Tests: No results for input(s): "TSH", "T4TOTAL", "FREET4", "T3FREE", "THYROIDAB" in the last 72 hours. Anemia Panel: No results for input(s): "VITAMINB12", "FOLATE", "FERRITIN", "TIBC", "IRON",  "RETICCTPCT" in the last 72 hours. Sepsis Labs: No results for input(s): "PROCALCITON", "LATICACIDVEN" in the last 168 hours.  No results found for this or any previous visit (from the past 240 hour(s)).  Radiology Studies: No results found.  Scheduled Meds:  (feeding supplement) PROSource Plus  30 mL Oral BID BM   acidophilus  1 capsule Oral Daily   alosetron  1 mg Oral BID   amitriptyline  50 mg Oral QHS   atorvastatin  40 mg Oral q1800   carvedilol  3.125 mg Oral BID   Chlorhexidine Gluconate Cloth  6 each Topical Q0600   docusate sodium  200 mg Oral BID   DULoxetine  60 mg Oral Daily   famotidine  20 mg Oral Daily   fentaNYL  1 patch Transdermal Q72H   furosemide  80 mg Oral Daily   heparin sodium (porcine)  2,000 Units Intravenous Once   hydrALAZINE  50 mg Oral TID   insulin aspart  0-9 Units Subcutaneous TID WC   insulin aspart  6 Units Subcutaneous TID WC   insulin glargine-yfgn  10 Units Subcutaneous QHS   methocarbamol  500 mg Oral QID   multivitamin  1 tablet Oral QHS   pantoprazole  80 mg Oral Daily   polyethylene glycol  17 g Oral Daily   sevelamer carbonate  2,400 mg Oral TID WC   sodium zirconium cyclosilicate  10 g Oral Once per day on Sun Tue Thu Sat   Continuous Infusions:  albumin human       LOS: 169 days    Time spent: 35 mins    Avyan Livesay, MD [facility]   If 7PM-7AM, please contact night-coverage

## 2022-02-11 NOTE — Progress Notes (Signed)
Physical Therapy Treatment Patient Details Name: [name] MRN: [id number] DOB: 09/10/1965 Today's Date: 02/11/2022   History of Present Illness 56 y.o. female presented to ED 6/26 from dialysis with increased bloody drainage from L hip wound. 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 s/p hardware removal of left hip with partial resection of tuft tissue and femure that were nonviable on 08/10/21 Discharged home 6/23. underwent L hip debridement and wound vac placement 6/28; +Left intertrochanteric non-union,  Fracture of left distal femur  PMH: hypertension, hyperlipidemia, ESRD on HD MWF, history of left BKA in 2021, depression/anxiety, stroke, tobacco use, T2DM,  insomnia, chronic pain syndrome,    PT Comments    Continuing work on functional mobility and activity tolerance;  session focused on functional transfers opted for the lateral scoot bed to wheelchair due to significant R ankle pain, especially with weight bearing; performed lateral scoot for the first time with her prosthesis on (giving her more ability to use LEs as a pivot point during lateral scooting); Needed up to Max assist to transfer to her Right to the [facility] (incr R foot and ankle pain), and needing less assist with scoot transfer to L back to bed;   Spent time discussing functional status and functional goals, and [name]'s best options to reach her stated goal of independence (including driving); Tried to clearly convey to her that a post-acute care rehab facility, with a consistent daily/ADL schedule, PT and OT, and (more importantly to [place]) a consistent HD schedule; [name] clearly stated she wants to drive herself to/from HD, and given the nature of her impairments of both LEs, this isn't realistic; when asked to tell us more, she expressed concern about her gut, and the worry re: a bowel/bladder emergency in transit. Will consider ways to reassure pt that it can be handled, will discuss  with OT and nursing, too;   Notified Dr. Dwyane Dee of ankle pain effecting transfers via secure chat; he will order imaging   Recommendations for follow up therapy are one component of a multi-disciplinary discharge planning process, led by the attending physician.  Recommendations may be updated based on patient status, additional functional criteria and insurance authorization.  Follow Up Recommendations  Skilled nursing-short term rehab (<3 hours/day) Can patient physically be transported by private vehicle: No   Assistance Recommended at Discharge Intermittent Supervision/Assistance  Patient can return home with the following A lot of help with bathing/dressing/bathroom;Assistance with cooking/housework;Assist for transportation;Two people to help with walking and/or transfers;Help with stairs or ramp for entrance   Equipment Recommendations  Other (comment) (Sliding board, drop-arm BSC; needs a wheelchair cushion; would need medical [name] Lei transport for HD)    Recommendations for Other Services  Recommend we consider if/how to get Neuropsych services, given major life changes this year, and difficulty following through with decisions to help her meet her goal of independence     Precautions / Restrictions Precautions Precautions: Fall;Other (comment) Precaution Comments: L BKA (baseline) Restrictions Weight Bearing Restrictions: No LLE Weight Bearing: Weight bearing as tolerated Other Position/Activity Restrictions: L distal femur fx; per Dr [name] 8/10 pt can full WB; Wear liner during the day and shrinker overnight     Mobility  Bed Mobility Overal bed mobility: Needs Assistance Bed Mobility: Rolling, Sidelying to Sit Rolling: Supervision Sidelying to sit: Min guard       General bed mobility comments: Incr time and cues for pushing sidelying to upright sitting; Closer gurad today because we used her L  prosthesis, and she needs to get use to the longer lever arm and angles that  the prosthesis puts her hip and knee into    Transfers Overall transfer level: Needs assistance Equipment used: Sliding board Transfers: Bed to chair/wheelchair/BSC            Lateral/Scoot Transfers: Max assist, +2 physical assistance, +2 safety/equipment, With slide board, From elevated surface General transfer comment: Needing more assist with lateral scoot transfer today; Pt with painful R ankle, especially with any weight bearing R foot, and without being able to lean onto R foot to unweigh hips for scooting, she needed considerably more assist and use of bed pad to scoot bed to [facility] on her Right; Able to use her L prosthesis foot as a pivot point, but with hip and knee limitations the foot had limited purchase on teh floor    Ambulation/Gait                   Stairs             Wheelchair Mobility    Modified Rankin (Stroke Patients Only)       Balance     Sitting balance-Leahy Scale: Good                                      Cognition Arousal/Alertness: Awake/alert Behavior During Therapy: WFL for tasks assessed/performed Overall Cognitive Status: No family/caregiver present to determine baseline cognitive functioning Area of Impairment: Safety/judgement, Awareness                     Memory: Decreased short-term memory   Safety/Judgement: Decreased awareness of deficits, Decreased awareness of safety Awareness: Emergent   General Comments: Refusing to wear liner during the day, now refusing shrinker. Agreeable to prosthetic sock only        Exercises      General Comments General comments (skin integrity, edema, etc.): Lenghty discussion with pt re: functional goals, and strong recommendation for post-acute rehab stay for consistent PT/OT practice to maximize independence and safety with mobility and ADLs to prepare for getting home      Pertinent Vitals/Pain Pain Assessment Pain Assessment: 0-10 Pain Score: 9   Pain Location: L hip, R ankle Pain Descriptors / Indicators: Discomfort, Constant Pain Intervention(s): Monitored during session, Other (comment) (Notified MD via secure chat)    Home Living                          Prior Function            PT Goals (current goals can now be found in the care plan section) Acute Rehab PT Goals Patient Stated Goal: Wants to be independent; wants to be able to drive herself to/from HD PT Goal Formulation: With patient Time For Goal Achievement: 02/13/22 Potential to Achieve Goals: Fair Progress towards PT goals: Progressing toward goals (Slowly)    Frequency    Min 2X/week      PT Plan Current plan remains appropriate    Co-evaluation              AM-PAC PT "6 Clicks" Mobility   Outcome Measure  Help needed turning from your back to your side while in a flat bed without using bedrails?: None Help needed moving from lying on your back to sitting on the side of a flat  bed without using bedrails?: A Little Help needed moving to and from a bed to a chair (including a wheelchair)?: A Little Help needed standing up from a chair using your arms (e.g., wheelchair or bedside chair)?: Total Help needed to walk in hospital room?: Total Help needed climbing 3-5 steps with a railing? : Total 6 Click Score: 13    End of Session Equipment Utilized During Treatment: Other (comment) (L prosthesis, sliding board, bed pad)   Patient left: in bed;with call bell/phone within reach (with [facility]) Nurse Communication: Mobility status (R ankle pain -- notified Dr. Dwyane Dee as well) PT Visit Diagnosis: Other abnormalities of gait and mobility (R26.89);Muscle weakness (generalized) (M62.81);History of falling (Z91.81);Pain Pain - Right/Left: Left Pain - part of body: Leg (and R foot and ankle)     Time: 1696-7893 PT Time Calculation (min) (ACUTE ONLY): 70 min  Charges:  $Therapeutic Activity: 38-52 mins $Self Care/Home  Management: [place] [facility] [place] 02/11/2022, 3:29 PM

## 2022-02-11 NOTE — Progress Notes (Signed)
[facility] Progress Note   Subjective:   2.7L UF with HD yesterday. Pt signed off HD early, says HD was late and she missed lunch, needed to eat. She is upset about timing of dialysis yesterday. Denies SOB, CP, dizziness and nausea.    Objective Vitals:   02/10/22 1755 02/10/22 1809 02/10/22 2121 02/11/22 0832  BP: (!) 197/70 (!) 191/66 (!) 161/58 (!) 169/66  Pulse: (!) 58 (!) 59 68 63  Resp: 12 15 18 18   Temp:  98 F (36.7 C)  98.5 F (36.9 C)  TempSrc:    Oral  SpO2:  100% 99% 99%  Weight:  95.2 kg 97.2 kg   Height:       Physical Exam General: Alert female, tearful but NAD Heart: RRR, no murmurs, rubs or gallops Lungs: CTA bilaterally without wheezing, rhonchi or rales Abdomen: soft, non-distended Extremities: 2+ edema L hip, 1+ LE edema Dialysis Access:  LUE AVF + bruit  Additional Objective Labs: Basic Metabolic Panel: Recent Labs  Lab 02/06/22 0712 02/07/22 1233 02/10/22 1549  NA 129* 129* 125*  K 4.7 5.2* 5.2*  CL 90* 90* 87*  CO2 25 23 23   GLUCOSE 120* 113* 197*  BUN 67* 101* 115*  CREATININE 5.50* 6.73* 6.86*  CALCIUM 9.2 9.4 8.7*  PHOS 5.5* 6.2* 6.6*   Liver Function Tests: Recent Labs  Lab 02/05/22 0856 02/07/22 1233  ALBUMIN 3.6 3.9   No results for input(s): "LIPASE", "AMYLASE" in the last 168 hours. CBC: Recent Labs  Lab 02/05/22 0856 02/06/22 0712 02/07/22 1233 02/10/22 1549  WBC 8.0 7.9 6.1 7.2  HGB 11.5* 12.3 12.1 11.1*  HCT 33.5* 38.6 36.2 34.0*  MCV 90.3 92.6 89.4 90.9  PLT 237 240 208 210   Blood Culture    Component Value Date/Time   SDES TISSUE 08/28/2021 0917   SPECREQUEST LEFT THIGH TISSUE 08/28/2021 0917   CULT  08/28/2021 0917    MODERATE ENTEROCOCCUS FAECIUM VANCOMYCIN RESISTANT ENTEROCOCCUS ISOLATED NO ANAEROBES ISOLATED Sent to [place] for further susceptibility testing. Performed at [facility], [place] [address] [place], [place] [address]    REPTSTATUS 09/02/2021 FINAL 08/28/2021 9833     Cardiac Enzymes: No results for input(s): "CKTOTAL", "CKMB", "CKMBINDEX", "TROPONINI" in the last 168 hours. CBG: Recent Labs  Lab 02/09/22 2053 02/10/22 0728 02/10/22 1116 02/10/22 2124 02/11/22 0718  GLUCAP 148* 151* 77 296* 108*   Iron Studies: No results for input(s): "IRON", "TIBC", "TRANSFERRIN", "FERRITIN" in the last 72 hours. @lablastinr3 @ Studies/Results: No results found. Medications:  albumin human      (feeding supplement) PROSource Plus  30 mL Oral BID BM   acidophilus  1 capsule Oral Daily   alosetron  1 mg Oral BID   amitriptyline  50 mg Oral QHS   atorvastatin  40 mg Oral q1800   carvedilol  3.125 mg Oral BID   docusate sodium  200 mg Oral BID   DULoxetine  60 mg Oral Daily   famotidine  20 mg Oral Daily   fentaNYL  1 patch Transdermal Q72H   furosemide  80 mg Oral Daily   heparin sodium (porcine)  2,000 Units Intravenous Once   hydrALAZINE  50 mg Oral TID   insulin aspart  0-9 Units Subcutaneous TID WC   insulin aspart  6 Units Subcutaneous TID WC   insulin glargine-yfgn  10 Units Subcutaneous QHS   methocarbamol  500 mg Oral QID   multivitamin  1 tablet Oral QHS   pantoprazole  80 mg Oral Daily   polyethylene glycol  17 g Oral Daily   sevelamer carbonate  2,400 mg Oral TID WC   sodium zirconium cyclosilicate  10 g Oral Once per day on Sun Tue Thu Sat    Dialysis Orders: MWF at [facility], 400/500, EDW 90kg, 2K/2Ca, LUE AVF - heparin 4000 unit IV bolus, no ESA  Assessment/Plan: 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 Completed 6 wk course of IV Daptomycin on 10/12/21. Per Dr. Jess Barters note 12/28/2021 "NO RESTRICTIONS L HIP".  Chronic L hip/residual limb pain: Hx fractures of L femoral neck (where hardware was removed) as well as distal femur Fx. Pain control per primary.  ESRD: Usual MWF schedule. Clots HD system without heparin, 4000 unit bolus and 2000 unit mid-run bolus. Refused HD last week  d/t not liking the timing of it - managers aware and have discussed with patient.  Hyperkalemia: Stable on scheduled lokelma 4 days a week. HTN/volume: BP meds have been tapered down - currently on Coreg 3.125mg  BID only. Large fluids gains between HD which has been discussed extensively, Ongoing edema, UF prn. Na 125 suggesting hypervolemia Anemia of ESRD: Hgb 11.1. Last tsat 54%. Aranesp on hold. Secondary HPTH: CorrCa ok, no VDRA. Phos improving, continue Renvela as binder. Nutrition: Alb low, continue protein supplements.  Renal diet w/fluid restrictions.   AVF dysfunction (resolved): S/p branch ligation 09/19/2021  Dr. Virl Cagey. AVF working well now. DM2: Insulin per primary Dispo: SNF previously recommended, but she declined. Not home HD candidate.  Dialyzed in recliner on 9/4 but signed off early due to pain, has declined to come in a recliner since then.  Strongly discussed on the importance of increasing her mobility as much as possible while she is in her room in addition to following her ongoing fluid restriction. Also reinforced the importance of being in the chair for HD treatments to improve strength and mobility. Does not like to do PT after HD, tried to do HD in the afternoons so she can do PT in the AM but she still refused HD today. Per Renal Navigator notes there are no clinics that will accept patient unless she will have HD in chair. Will continue to encourage use of chair for HD.    Anice Paganini, PA-C 02/11/2022, 9:05 AM  [facility] Pager: [phone]

## 2022-02-12 LAB — CBC
HCT: 32.8 % — ABNORMAL LOW (ref 36.0–46.0)
Hemoglobin: 11.2 g/dL — ABNORMAL LOW (ref 12.0–15.0)
MCH: 30.4 pg (ref 26.0–34.0)
MCHC: 34.1 g/dL (ref 30.0–36.0)
MCV: 88.9 fL (ref 80.0–100.0)
Platelets: 199 10*3/uL (ref 150–400)
RBC: 3.69 MIL/uL — ABNORMAL LOW (ref 3.87–5.11)
RDW: 13.9 % (ref 11.5–15.5)
WBC: 6.3 10*3/uL (ref 4.0–10.5)
nRBC: 0 % (ref 0.0–0.2)

## 2022-02-12 LAB — RENAL FUNCTION PANEL
Albumin: 3.7 g/dL (ref 3.5–5.0)
Anion gap: 14 (ref 5–15)
BUN: 99 mg/dL — ABNORMAL HIGH (ref 6–20)
CO2: 24 mmol/L (ref 22–32)
Calcium: 8.8 mg/dL — ABNORMAL LOW (ref 8.9–10.3)
Chloride: 90 mmol/L — ABNORMAL LOW (ref 98–111)
Creatinine, Ser: 6.11 mg/dL — ABNORMAL HIGH (ref 0.44–1.00)
GFR, Estimated: 8 mL/min — ABNORMAL LOW (ref >60)
Glucose, Bld: 156 mg/dL — ABNORMAL HIGH (ref 70–99)
Phosphorus: 6.3 mg/dL — ABNORMAL HIGH (ref 2.5–4.6)
Potassium: 5 mmol/L (ref 3.5–5.1)
Sodium: 128 mmol/L — ABNORMAL LOW (ref 135–145)

## 2022-02-12 LAB — GLUCOSE, CAPILLARY
Glucose-Capillary: 158 mg/dL — ABNORMAL HIGH (ref 70–99)
Glucose-Capillary: 169 mg/dL — ABNORMAL HIGH (ref 70–99)
Glucose-Capillary: 260 mg/dL — ABNORMAL HIGH (ref 70–99)
Glucose-Capillary: 219 mg/dL — ABNORMAL HIGH (ref 70–99)

## 2022-02-12 MED ORDER — ALBUMIN HUMAN 25 % IV SOLN
INTRAVENOUS | Status: AC
  Filled 2022-02-12: qty 100

## 2022-02-12 MED ORDER — HEPARIN SODIUM (PORCINE) 1000 UNIT/ML IJ SOLN
INTRAMUSCULAR | Status: AC
  Filled 2022-02-12: qty 4

## 2022-02-12 MED ORDER — HEPARIN SODIUM (PORCINE) 1000 UNIT/ML DIALYSIS
2000.0000 [IU] | Freq: Once | INTRAMUSCULAR | Status: DC
  Filled 2022-02-12: qty 2

## 2022-02-12 MED ORDER — HEPARIN SODIUM (PORCINE) 1000 UNIT/ML DIALYSIS
4000.0000 [IU] | Freq: Once | INTRAMUSCULAR | Status: AC
  Administered 2022-02-12: 4000 [IU] via INTRAVENOUS_CENTRAL

## 2022-02-12 NOTE — Progress Notes (Signed)
Received patient in her bed,alert and oriented x 4. Denies pain .Vtals stable.  Access used :Left upper arm fistula that works well   Duration of treatment : 4 hours  Medicines given;: Heparin 4,000 units bolus at the start of treatment.                               Heparin 2,000 units given at midway of treatment.  Fluid removed:  4 liters.  Hemodialysis treatment issue. None  Hands off to the floor nurse.

## 2022-02-12 NOTE — Progress Notes (Signed)
Patient refused to allow lab draws for the morning. Patient stated that she did not want to be bothered.

## 2022-02-12 NOTE — Progress Notes (Signed)
  Progress Note   Patient: [name] KGY:[id number] DOB: [birth year] DOA: 08/26/2021     170 DOS: the patient was seen and examined on 02/12/2022   Brief hospital course: This 56 year old female with PMH significant of hypertension, hyperlipidemia, diabetes mellitus type 2, end-stage renal disease on hemodialysis, status post left BKA for chronic calcaneus osteomyelitis on 01/12/2020, bipolar disorder, left distal femoral fracture after a fall on 07/19/2021 deemed  nonoperative, admission from 08/03/2021-08/23/2021 for left femoral osteomyelitis requiring partial resection of femur and rearrangements of bony fragments with subsequent antibiotic treatment presented to the hospital with left hip bleeding and was found to have subacute osteomyelitis and abscess of left femur.  Orthopedic/ID were consulted and patient was started on broad-spectrum antibiotics.  Patient underwent multiple I&D's by Dr. [name] along with local tissue rearrangement.  Patient was also seen by vascular surgery who did fistula branch ligation.  Patient has completed 6-week course of daptomycin on 10/09/2021.  She has been unable to complete hemodialysis in chair and outpatient hemodialysis unit has been hard to find. She is otherwise medically stable for discharge.      Assessment and Plan: * Subacute osteomyelitis of left femur with abscess  Status post surgical intervention by Dr. [name] with multiple left hip debridements.   She has completed a 6-week course of daptomycin on 10/09/2021.    Re-imaging on 12/02/2021 of left knee showed healing fracture with callus.   Orthopedics Dr [name] has followed the patient during hospitalization and is doing well.   No left hip restrictions at this time.  Continue fentanyl patch, oxycodone and Robaxin.  ESRD (end stage renal disease) on dialysis ([place]) Sp fistula revision and branch ligation.  Patient very deconditioned and not able to tolerate chair HD. Plan to continue PT in SNF Continue  renal replacement therapy M-W-F.  Continue furosemide 80 mg daily.   Anemia of chronic due to renal disease  SP 2 units PRBC transfusion, continue close follow up on Hgb and hct.  Continue with EPO   History of CVA (cerebrovascular accident) Continue statin   Type 2 diabetes mellitus with hyperlipidemia (HCC) A1C of 7.1 in 07/2021 Uncontrolled hyperglycemia.  Continue insulin therapy basal and short acting.   Continue with statin therapy.   Essential hypertension Continue blood pressure control with amlodipine, carvedilol and hydralazine.   Anxiety and depression Continue cymbalta and elavil at night   GASTROESOPHAGEAL REFLUX, NO ESOPHAGITIS Continue her dexilant   TOBACCO DEPENDENCE Encouraged cessation, declines patch         Subjective: Patient continue to have pain with no nausea or vomiting.   Physical Exam: Vitals:   02/12/22 1226 02/12/22 1250 02/12/22 1347 02/12/22 1653  BP: (!) 135/56  (!) 160/60 (!) 148/51  Pulse: 68  62 70  Resp: 17  18 18   Temp: 98.1 F (36.7 C)  98.1 F (36.7 C) 98.7 F (37.1 C)  TempSrc:   Oral Oral  SpO2: 98%  98% 98%  Weight:  93.9 kg    Height:       Neurology awake and alert ENT with no pallor Cardiovascular with S1 and S2 present and rhythmic Respiratory with no rales or wheezing Abdomen with no distention  Data Reviewed:    Family Communication: no family at the bedside   Disposition: Status is: Inpatient Remains inpatient appropriate because: pending placement   Planned Discharge Destination: Home      Author: Tawni Millers, MD 02/12/2022 5:11 PM  For on call review [email or web address].

## 2022-02-12 NOTE — Progress Notes (Signed)
[facility] Progress Note   Subjective:   Seen on HD, tolerating well. AM labs pending. Denies SOB, CP, dizziness and nausea. Ongoing LE edema.   Objective Vitals:   02/11/22 0832 02/11/22 2118 02/12/22 0758 02/12/22 0810  BP: (!) 169/66 (!) 151/51 (!) 163/74 (!) 155/53  Pulse: 63 63 60 61  Resp: 18 18 14 17   Temp: 98.5 F (36.9 C) (!) 97.4 F (36.3 C) 97.7 F (36.5 C)   TempSrc: Oral Oral Oral   SpO2: 99% 98% 98% 96%  Weight:  97 kg    Height:       Physical Exam General: Alert female, tearful but NAD Heart: RRR, no murmurs, rubs or gallops Lungs: CTA bilaterally without wheezing, rhonchi or rales Abdomen: soft, non-distended Extremities: 3+ edema L leg to hip, 1+ LE edema Dialysis Access:  LUE AVF + bruit  Additional Objective Labs: Basic Metabolic Panel: Recent Labs  Lab 02/06/22 0712 02/07/22 1233 02/10/22 1549  NA 129* 129* 125*  K 4.7 5.2* 5.2*  CL 90* 90* 87*  CO2 25 23 23   GLUCOSE 120* 113* 197*  BUN 67* 101* 115*  CREATININE 5.50* 6.73* 6.86*  CALCIUM 9.2 9.4 8.7*  PHOS 5.5* 6.2* 6.6*   Liver Function Tests: Recent Labs  Lab 02/05/22 0856 02/07/22 1233  ALBUMIN 3.6 3.9   No results for input(s): "LIPASE", "AMYLASE" in the last 168 hours. CBC: Recent Labs  Lab 02/05/22 0856 02/06/22 0712 02/07/22 1233 02/10/22 1549 02/12/22 0805  WBC 8.0 7.9 6.1 7.2 6.3  HGB 11.5* 12.3 12.1 11.1* 11.2*  HCT 33.5* 38.6 36.2 34.0* 32.8*  MCV 90.3 92.6 89.4 90.9 88.9  PLT 237 240 208 210 199   Blood Culture    Component Value Date/Time   SDES TISSUE 08/28/2021 0917   SPECREQUEST LEFT THIGH TISSUE 08/28/2021 0917   CULT  08/28/2021 0917    MODERATE ENTEROCOCCUS FAECIUM VANCOMYCIN RESISTANT ENTEROCOCCUS ISOLATED NO ANAEROBES ISOLATED Sent to [facility] for further susceptibility testing. Performed at [facility], [place] [address], [place], [place] [address]    REPTSTATUS 09/02/2021 FINAL 08/28/2021 2297    Cardiac Enzymes: No  results for input(s): "CKTOTAL", "CKMB", "CKMBINDEX", "TROPONINI" in the last 168 hours. CBG: Recent Labs  Lab 02/11/22 0718 02/11/22 1121 02/11/22 1601 02/11/22 2119 02/12/22 0652  GLUCAP 108* 181* 154* 132* 158*   Iron Studies: No results for input(s): "IRON", "TIBC", "TRANSFERRIN", "FERRITIN" in the last 72 hours. @lablastinr3 @ Studies/Results: DG Foot 2 Views Right  Result Date: 02/11/2022 CLINICAL DATA:  Pain EXAM: RIGHT FOOT - 2 VIEW COMPARISON:  Radiograph 08/26/2021 FINDINGS: Diffuse osteopenia. No definite acute fracture. There are flexion deformities of multiple toes. There is mild soft tissue swelling of the foot. Mild first MTP osteoarthritis. IMPRESSION: No definite acute osseous abnormality.  Diffuse osteopenia. Mild soft tissue swelling of the foot. Mild first MTP osteoarthritis. Electronically Signed   By: Maurine Simmering M.D.   On: 02/11/2022 19:42   DG Ankle Complete Right  Result Date: 02/11/2022 CLINICAL DATA:  Ankle pain, right EXAM: RIGHT ANKLE - COMPLETE 3+ VIEW COMPARISON:  None Available. FINDINGS: There is mild ankle soft tissue swelling. There is no evidence of acute fracture. There is diffuse osteopenia. Mild tibiotalar degenerative change. Tibiotalar joint effusion. Vascular calcifications. IMPRESSION: No acute osseous abnormality. Ankle soft tissue swelling.  Small tibiotalar joint effusion. Mild tibiotalar degenerative change. Diffuse osteopenia. Electronically Signed   By: Maurine Simmering M.D.   On: 02/11/2022 19:40   Medications:  albumin  human      (feeding supplement) PROSource Plus  30 mL Oral BID BM   acidophilus  1 capsule Oral Daily   alosetron  1 mg Oral BID   amitriptyline  50 mg Oral QHS   atorvastatin  40 mg Oral q1800   carvedilol  3.125 mg Oral BID   Chlorhexidine Gluconate Cloth  6 each Topical Q0600   docusate sodium  200 mg Oral BID   DULoxetine  60 mg Oral Daily   famotidine  20 mg Oral Daily   fentaNYL  1 patch Transdermal Q72H    furosemide  80 mg Oral Daily   [START ON 02/13/2022] heparin  2,000 Units Dialysis Once in dialysis   [START ON 02/13/2022] heparin  4,000 Units Dialysis Once in dialysis   heparin sodium (porcine)  2,000 Units Intravenous Once   hydrALAZINE  50 mg Oral TID   insulin aspart  0-9 Units Subcutaneous TID WC   insulin aspart  6 Units Subcutaneous TID WC   insulin glargine-yfgn  10 Units Subcutaneous QHS   methocarbamol  500 mg Oral QID   multivitamin  1 tablet Oral QHS   pantoprazole  80 mg Oral Daily   polyethylene glycol  17 g Oral Daily   sevelamer carbonate  2,400 mg Oral TID WC   sodium zirconium cyclosilicate  10 g Oral Once per day on Sun Tue Thu Sat    Outpatient Dialysis Orders: MWF at [facility], 400/500, EDW 90kg, 2K/2Ca, LUE AVF - heparin 4000 unit IV bolus, no ESA    Assessment/Plan: 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 Completed 6 wk course of IV Daptomycin on 10/12/21. Per Dr. Jess Barters note 12/28/2021 "NO RESTRICTIONS L HIP".  Chronic L hip/residual limb pain: Hx fractures of L femoral neck (where hardware was removed) as well as distal femur Fx. Pain control per primary.  ESRD: Usual MWF schedule. Clots HD system without heparin, 4000 unit bolus and 2000 unit mid-run bolus. Refused HD last week d/t not liking the timing of it - managers aware and have discussed with patient.  Hyperkalemia: Stable on scheduled lokelma 4 days a week. HTN/volume: BP meds have been tapered down - currently on Coreg 3.125mg  BID only. Large fluids gains between HD which has been discussed extensively, Ongoing edema, UF prn. Na 125 suggesting hypervolemia Anemia of ESRD: Hgb 11.1. Last tsat 54%. Aranesp on hold. Secondary HPTH: CorrCa ok, no VDRA. Phos improving, continue Renvela as binder. Nutrition: Alb low, continue protein supplements.  Renal diet w/fluid restrictions.   AVF dysfunction (resolved): S/p branch ligation 09/19/2021  Dr. Virl Cagey.  AVF working well now. DM2: Insulin per primary Dispo: SNF previously recommended, but she declined. Not home HD candidate.  Dialyzed in recliner on 9/4 but signed off early due to pain, has declined to come in a recliner since then.  Strongly discussed on the importance of increasing her mobility as much as possible while she is in her room in addition to following her ongoing fluid restriction. Also reinforced the importance of being in the chair for HD treatments to improve strength and mobility. Does not like to do PT after HD, tried to do HD in the afternoons so she can do PT in the AM but she refuses PT on HD days. Per Renal Navigator notes there are no clinics that will accept patient unless she will have HD in chair. Will continue to encourage use of chair for HD.    Aldona Bar  Marissa Weaver, PA-C 02/12/2022, 8:22 AM  [employer] Pager: [phone]

## 2022-02-13 LAB — GLUCOSE, CAPILLARY
Glucose-Capillary: 126 mg/dL — ABNORMAL HIGH (ref 70–99)
Glucose-Capillary: 215 mg/dL — ABNORMAL HIGH (ref 70–99)
Glucose-Capillary: 215 mg/dL — ABNORMAL HIGH (ref 70–99)
Glucose-Capillary: 165 mg/dL — ABNORMAL HIGH (ref 70–99)
Glucose-Capillary: 170 mg/dL — ABNORMAL HIGH (ref 70–99)

## 2022-02-13 MED ORDER — CHLORHEXIDINE GLUCONATE CLOTH 2 % EX PADS: 6.0000 | MEDICATED_PAD | Freq: Every day | CUTANEOUS | Status: DC

## 2022-02-13 MED ORDER — PANTOPRAZOLE SODIUM 40 MG PO TBEC
40.0000 mg | DELAYED_RELEASE_TABLET | Freq: Every day | ORAL | Status: DC
  Administered 2022-02-13 – 2022-03-18 (×34): 40 mg via ORAL
  Filled 2022-02-13 (×33): qty 1

## 2022-02-13 NOTE — Assessment & Plan Note (Signed)
Calculated BMI 30.7 consistent with obesity class 1.

## 2022-02-13 NOTE — Progress Notes (Signed)
Patient refused VS and lab draws for night time. Patient stated does not want to be disturbed and cannot go back to sleep if awakened.

## 2022-02-13 NOTE — Progress Notes (Signed)
[facility] Progress Note   Subjective:   No new concerns, notes that leg is still a bit edematous. Denies SOB, CP, dizziness and nausea.   Objective Vitals:   02/12/22 1347 02/12/22 1653 02/12/22 2052 02/13/22 0957  BP: (!) 160/60 (!) 148/51 (!) 140/57 (!) 159/56  Pulse: 62 70 73 62  Resp: 18 18 18 18   Temp: 98.1 F (36.7 C) 98.7 F (37.1 C) 98.8 F (37.1 C) 98.3 F (36.8 C)  TempSrc: Oral Oral Oral Oral  SpO2: 98% 98% 99% 98%  Weight:   97.2 kg   Height:       Physical Exam General: Alert female,  NAD Heart: RRR, no murmurs, rubs or gallops Lungs: CTA bilaterally without wheezing, rhonchi or rales Abdomen: soft, non-distended Extremities: 2+ edema L leg to hip, 1+ LE edema Dialysis Access:  LUE AVF + bruit    Additional Objective Labs: Basic Metabolic Panel: Recent Labs  Lab 02/07/22 1233 02/10/22 1549 02/12/22 0805  NA 129* 125* 128*  K 5.2* 5.2* 5.0  CL 90* 87* 90*  CO2 23 23 24   GLUCOSE 113* 197* 156*  BUN 101* 115* 99*  CREATININE 6.73* 6.86* 6.11*  CALCIUM 9.4 8.7* 8.8*  PHOS 6.2* 6.6* 6.3*   Liver Function Tests: Recent Labs  Lab 02/07/22 1233 02/12/22 0805  ALBUMIN 3.9 3.7   No results for input(s): "LIPASE", "AMYLASE" in the last 168 hours. CBC: Recent Labs  Lab 02/07/22 1233 02/10/22 1549 02/12/22 0805  WBC 6.1 7.2 6.3  HGB 12.1 11.1* 11.2*  HCT 36.2 34.0* 32.8*  MCV 89.4 90.9 88.9  PLT 208 210 199   Blood Culture    Component Value Date/Time   SDES TISSUE 08/28/2021 0917   SPECREQUEST LEFT THIGH TISSUE 08/28/2021 0917   CULT  08/28/2021 0917    MODERATE ENTEROCOCCUS FAECIUM VANCOMYCIN RESISTANT ENTEROCOCCUS ISOLATED NO ANAEROBES ISOLATED Sent to [facility] for further susceptibility testing. Performed at [facility], [place] [address], [place], [place] [address]    REPTSTATUS 09/02/2021 FINAL 08/28/2021 9326    Cardiac Enzymes: No results for input(s): "CKTOTAL", "CKMB", "CKMBINDEX", "TROPONINI" in the  last 168 hours. CBG: Recent Labs  Lab 02/12/22 1345 02/12/22 1632 02/12/22 2053 02/13/22 0720 02/13/22 1134  GLUCAP 169* 260* 219* 126* 215*  215*   Iron Studies: No results for input(s): "IRON", "TIBC", "TRANSFERRIN", "FERRITIN" in the last 72 hours. @lablastinr3 @ Studies/Results: DG Foot 2 Views Right  Result Date: 02/11/2022 CLINICAL DATA:  Pain EXAM: RIGHT FOOT - 2 VIEW COMPARISON:  Radiograph 08/26/2021 FINDINGS: Diffuse osteopenia. No definite acute fracture. There are flexion deformities of multiple toes. There is mild soft tissue swelling of the foot. Mild first MTP osteoarthritis. IMPRESSION: No definite acute osseous abnormality.  Diffuse osteopenia. Mild soft tissue swelling of the foot. Mild first MTP osteoarthritis. Electronically Signed   By: Maurine Simmering M.D.   On: 02/11/2022 19:42   DG Ankle Complete Right  Result Date: 02/11/2022 CLINICAL DATA:  Ankle pain, right EXAM: RIGHT ANKLE - COMPLETE 3+ VIEW COMPARISON:  None Available. FINDINGS: There is mild ankle soft tissue swelling. There is no evidence of acute fracture. There is diffuse osteopenia. Mild tibiotalar degenerative change. Tibiotalar joint effusion. Vascular calcifications. IMPRESSION: No acute osseous abnormality. Ankle soft tissue swelling.  Small tibiotalar joint effusion. Mild tibiotalar degenerative change. Diffuse osteopenia. Electronically Signed   By: Maurine Simmering M.D.   On: 02/11/2022 19:40   Medications:  albumin human      (feeding supplement) PROSource  Plus  30 mL Oral BID BM   alosetron  1 mg Oral BID   amitriptyline  50 mg Oral QHS   atorvastatin  40 mg Oral q1800   carvedilol  3.125 mg Oral BID   Chlorhexidine Gluconate Cloth  6 each Topical Q0600   docusate sodium  200 mg Oral BID   fentaNYL  1 patch Transdermal Q72H   furosemide  80 mg Oral Daily   hydrALAZINE  50 mg Oral TID   insulin aspart  0-9 Units Subcutaneous TID WC   insulin aspart  6 Units Subcutaneous TID WC   insulin  glargine-yfgn  10 Units Subcutaneous QHS   methocarbamol  500 mg Oral QID   multivitamin  1 tablet Oral QHS   pantoprazole  40 mg Oral Daily   polyethylene glycol  17 g Oral Daily   sevelamer carbonate  2,400 mg Oral TID WC   sodium zirconium cyclosilicate  10 g Oral Once per day on Sun Tue Thu Sat    Outpatient Dialysis Orders: MWF at [place] [place], 400/500, EDW 90kg, 2K/2Ca, LUE AVF - heparin 4000 unit IV bolus, no ESA  Assessment/Plan: 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 Completed 6 wk course of IV Daptomycin on 10/12/21. Per Dr. Jess Barters note 12/28/2021 "NO RESTRICTIONS L HIP".  Chronic L hip/residual limb pain: Hx fractures of L femoral neck (where hardware was removed) as well as distal femur Fx. Pain control per primary.  ESRD: Usual MWF schedule. Clots HD system without heparin, 4000 unit bolus and 2000 unit mid-run bolus. Refused HD last week d/t not liking the timing of it - managers aware and have discussed with patient.  Hyperkalemia: Stable on scheduled lokelma 4 days a week. HTN/volume: BP meds have been tapered down - currently on Coreg 3.125mg  BID only. Large fluids gains between HD which has been discussed extensively, Ongoing edema, UF prn. Na 128 suggesting hypervolemia Anemia of ESRD: Hgb 11.2. Last tsat 54%. Aranesp on hold. Secondary HPTH: CorrCa ok, no VDRA. Phos improving, continue Renvela as binder. Nutrition: Alb low, continue protein supplements.  Renal diet w/fluid restrictions.   AVF dysfunction (resolved): S/p branch ligation 09/19/2021  Dr. Virl Cagey. AVF working well now. DM2: Insulin per primary Dispo: SNF previously recommended, but she declined. Not home HD candidate.  Dialyzed in recliner on 9/4 but signed off early due to pain, has declined to come in a recliner since then.  Strongly discussed on the importance of increasing her mobility as much as possible while she is in her room in addition to following her  ongoing fluid restriction. Also reinforced the importance of being in the chair for HD treatments to improve strength and mobility. Does not like to do PT after HD, tried to do HD in the afternoons so she can do PT in the AM but she refuses PT on HD days. Per Renal Navigator notes there are no clinics that will accept patient unless she will have HD in chair. Will continue to encourage use of chair for HD.  Anice Paganini, PA-C 02/13/2022, 2:27 PM  [facility] Pager: [phone]

## 2022-02-13 NOTE — Progress Notes (Signed)
Progress Note   Patient: [name] QIH:[id number] DOB: Jul 18, 1965 DOA: 08/26/2021     171 DOS: the patient was seen and examined on 02/13/2022   Brief hospital course: This 56 year old female with PMH significant of hypertension, hyperlipidemia, diabetes mellitus type 2, end-stage renal disease on hemodialysis, status post left BKA for chronic calcaneus osteomyelitis on 01/12/2020, bipolar disorder, left distal femoral fracture after a fall on 07/19/2021 deemed  nonoperative, admission from 08/03/2021-08/23/2021 for left femoral osteomyelitis requiring partial resection of femur and rearrangements of bony fragments with subsequent antibiotic treatment presented to the hospital with left hip bleeding and was found to have subacute osteomyelitis and abscess of left femur.  Orthopedic/ID were consulted and patient was started on broad-spectrum antibiotics.  Patient underwent multiple I&D's by Dr. [name] along with local tissue rearrangement.  Patient was also seen by vascular surgery who did fistula branch ligation.  Patient has completed 6-week course of daptomycin on 10/09/2021.  She has been unable to complete hemodialysis in chair and outpatient hemodialysis unit has been hard to find. She is otherwise medically stable for discharge.      Assessment and Plan: * Subacute osteomyelitis of left femur with abscess  Status post surgical intervention by Dr. [name] with multiple left hip debridements.   Left BKA. She has completed a 6-week course of daptomycin on 10/09/2021.    Re-imaging on 12/02/2021 of left knee showed healing fracture with callus.    No left hip restrictions at this time.  Continue fentanyl patch, methocarbamol, and oxycodone.   ESRD (end stage renal disease) on dialysis ([place]) Sp fistula revision and branch ligation.  Hyponatremia, hyperphosphatemia, hyperkalemia.   Continue renal replacement therapy M-W-F.  Continue furosemide 80 mg daily.  Sodium zirconium on non HD days.    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 Continue with EPO  Follow up hgb 87.5   Metabolic bone disease, continue with sevelamer.   History of CVA (cerebrovascular accident) Continue statin   Type 2 diabetes mellitus with hyperlipidemia (HCC) A1C of 7.1 in 07/2021 Uncontrolled hyperglycemia.  Continue insulin therapy basal 10 units and pre meal insulin 6 units.    Continue with statin therapy.   Essential hypertension Continue blood pressure control with amlodipine, carvedilol and hydralazine.   Anxiety and depression Continue cymbalta and elavil at night   GASTROESOPHAGEAL REFLUX, NO ESOPHAGITIS Decrease pantoprazole to 40 mg to prevent further osteopenia.   TOBACCO DEPENDENCE Encouraged cessation, declines patch   Obesity (BMI 30.0-34.9) Calculated BMI 30.7 consistent with obesity class 1.         Subjective: Patient with left  leg pain and contracture, she has right leg pain when standing. No nausea or vomiting   Physical Exam: Vitals:   02/12/22 1347 02/12/22 1653 02/12/22 2052 02/13/22 0957  BP: (!) 160/60 (!) 148/51 (!) 140/57 (!) 159/56  Pulse: 62 70 73 62  Resp: 18 18 18 18   Temp: 98.1 F (36.7 C) 98.7 F (37.1 C) 98.8 F (37.1 C) 98.3 F (36.8 C)  TempSrc: Oral Oral Oral Oral  SpO2: 98% 98% 99% 98%  Weight:   97.2 kg   Height:       Neurology awake and alert ENT with mild pallor Cardiovascular with S1 and S2 present and rhythmic, with no gallops, rubs or murmurs Respiratory with no rales or wheezing Abdomen with no distention.  Right lower extremity with BKA, flex contracture, left leg with mild edema non pitting.  Data Reviewed:  Family Communication: no family at the bedside   Disposition: Status is: Inpatient Remains inpatient appropriate because: pending placement   Planned Discharge Destination:  to be determined      Author: Tawni Millers, MD 02/13/2022 9:59 AM  For on call review  [email or web address].

## 2022-02-13 NOTE — Progress Notes (Signed)
Physical Therapy Treatment Patient Details Name: [name] MRN: [id number] DOB: [birth year] Today's Date: 02/13/2022   History of Present Illness 56 y.o. female presented to ED 6/26 from dialysis with increased bloody drainage from L hip wound. 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 s/p hardware removal of left hip with partial resection of tuft tissue and femure that were nonviable on 08/10/21 Discharged home 6/23. underwent L hip debridement and wound vac placement 6/28; +Left intertrochanteric non-union,  Fracture of left distal femur  PMH: hypertension, hyperlipidemia, ESRD on HD MWF, history of left BKA in 2021, depression/anxiety, stroke, tobacco use, T2DM,  insomnia, chronic pain syndrome,    PT Comments    Great session today. Tolerated transfer training (focusing on transfer towards left with sliding board to master this technique as she has better ability here.) Min assist with sliding board today, cues throughout. Tolerated sitting in w/c and able to navigate through halls although became fatigued and needed some assistance with w/c training. Continue to encourage OOB, in w/c or recliner as much as possible to regain independence. Patient will continue to benefit from skilled physical therapy services to further improve independence with functional mobility.    Recommendations for follow up therapy are one component of a multi-disciplinary discharge planning process, led by the attending physician.  Recommendations may be updated based on patient status, additional functional criteria and insurance authorization.  Follow Up Recommendations  Skilled nursing-short term rehab (<3 hours/day) Can patient physically be transported by private vehicle: No   Assistance Recommended at Discharge Intermittent Supervision/Assistance  Patient can return home with the following A lot of help with bathing/dressing/bathroom;Assistance with  cooking/housework;Assist for transportation;Two people to help with walking and/or transfers;Help with stairs or ramp for entrance   Equipment Recommendations  Other (comment) (Sliding board, drop-arm BSC; needs a wheelchair cushion; would need medical [name] Lei transport for HD)    Recommendations for Other Services       Precautions / Restrictions Precautions Precautions: Fall;Other (comment) Precaution Comments: L BKA (baseline) Restrictions Weight Bearing Restrictions: No LLE Weight Bearing: Weight bearing as tolerated Other Position/Activity Restrictions: L distal femur fx; per Dr [name] 8/10 pt can full WB; Wear liner during the day and shrinker overnight     Mobility  Bed Mobility Overal bed mobility: Needs Assistance Bed Mobility: Supine to Sit, Sit to Supine     Supine to sit: HOB elevated, Min assist Sit to supine: Min guard   General bed mobility comments: Min assist for trunk, cues for LE placement to get to EOB, effortful, use of rail as needed. Min guard returning to bed.    Transfers Overall transfer level: Needs assistance Equipment used: Sliding board Transfers: Bed to chair/wheelchair/BSC            Lateral/Scoot Transfers: With slide board, From elevated surface, Min assist General transfer comment: Declines using prosthesis despite encouragement. Still willing to work on transfers. Pt required assist for sliding board placement, and cues for technique. Blocked anteriorly, able to slide with min assist towards LEFT out of and back into bed to/from wheelchair today. She was able to take sliding board out from underneath herself once back in bed. Min assist to glide bed pad only, did vast majority of transfer herself.    Ambulation/Gait                   Secretary/administrator  Wheelchair mobility: Yes Wheelchair propulsion: Both upper extremities Wheelchair parts: Needs assistance Distance: 150 Wheelchair  Assistance Details (indicate cue type and reason): Supervision for majority of distance and training. UEs became fatigued and needed assist for about 50 feet but then able to continue. Discussed turning and backing up. Good control. LE rest adjusted for comfort.  Modified Rankin (Stroke Patients Only)       Balance Overall balance assessment: Needs assistance Sitting-balance support: Feet supported Sitting balance-Leahy Scale: Good Sitting balance - Comments: able to maintain balance while performing lateral scooting                                    Cognition Arousal/Alertness: Awake/alert Behavior During Therapy: WFL for tasks assessed/performed Overall Cognitive Status: No family/caregiver present to determine baseline cognitive functioning Area of Impairment: Safety/judgement, Awareness                     Memory: Decreased short-term memory   Safety/Judgement: Decreased awareness of deficits, Decreased awareness of safety Awareness: Emergent   General Comments: Refusing to wear liner during the day, now refusing shrinker. Agreeable to prosthetic sock only        Exercises      General Comments General comments (skin integrity, edema, etc.): Further encouragement for donning shrinker and sleeve; repositioning of LLE.      Pertinent Vitals/Pain Pain Assessment Pain Assessment: Faces Faces Pain Scale: Hurts little more Pain Location: L hip, R ankle Pain Descriptors / Indicators: Discomfort, Constant Pain Intervention(s): Monitored during session, Repositioned    Home Living                          Prior Function            PT Goals (current goals can now be found in the care plan section) Acute Rehab PT Goals Patient Stated Goal: Wants to be independent; wants to be able to drive herself to/from HD PT Goal Formulation: With patient Time For Goal Achievement: 02/27/22 Potential to Achieve Goals: Fair Progress towards PT  goals: Progressing toward goals    Frequency    Min 2X/week      PT Plan Current plan remains appropriate    Co-evaluation              AM-PAC PT "6 Clicks" Mobility   Outcome Measure  Help needed turning from your back to your side while in a flat bed without using bedrails?: None Help needed moving from lying on your back to sitting on the side of a flat bed without using bedrails?: A Little Help needed moving to and from a bed to a chair (including a wheelchair)?: A Little Help needed standing up from a chair using your arms (e.g., wheelchair or bedside chair)?: Total Help needed to walk in hospital room?: Total Help needed climbing 3-5 steps with a railing? : Total 6 Click Score: 13    End of Session Equipment Utilized During Treatment: Gait belt (sliding board, bed pad) Activity Tolerance: Patient tolerated treatment well Patient left: in bed;with call bell/phone within reach;with bed alarm set Nurse Communication: Mobility status PT Visit Diagnosis: Other abnormalities of gait and mobility (R26.89);Muscle weakness (generalized) (M62.81);History of falling (Z91.81);Pain Pain - Right/Left: Left Pain - part of body: Leg (and R foot and ankle)     Time: 9798-9211 PT Time Calculation (min) (ACUTE ONLY): 25  min  Charges:  $Therapeutic Activity: 23-37 mins                     Candie Mile, PT, DPT Physical Therapist Acute Rehabilitation Services [facility]    [name] 02/13/2022, 4:46 PM

## 2022-02-13 NOTE — Progress Notes (Signed)
Occupational Therapy Treatment Patient Details Name: [name] MRN: [id number] DOB: 01-25-1966 Today's Date: 02/13/2022   History of present illness 56 y.o. female presented to ED 6/26 from dialysis with increased bloody drainage from L hip wound. 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 s/p hardware removal of left hip with partial resection of tuft tissue and femure that were nonviable on 08/10/21 Discharged home 6/23. underwent L hip debridement and wound vac placement 6/28; +Left intertrochanteric non-union,  Fracture of left distal femur  PMH: hypertension, hyperlipidemia, ESRD on HD MWF, history of left BKA in 2021, depression/anxiety, stroke, tobacco use, T2DM,  insomnia, chronic pain syndrome,   OT comments  Patient appears to be limited by pain with progressing towards transfers. Patient declined attempting to get into recliner or wheelchair with sliding board stating that sliding causes sacral pain and it's difficult to push through RLE. Patient performed pushups from EOB to bring bottom off EOB and performed lateral scooting to increase independence with lateral scoot transfers. Patient completed treatment with BUE strengthening exercises. Patient to continue to be followed by acute OT to progress in functional transfers and self care.    Recommendations for follow up therapy are one component of a multi-disciplinary discharge planning process, led by the attending physician.  Recommendations may be updated based on patient status, additional functional criteria and insurance authorization.    Follow Up Recommendations  Skilled nursing-short term rehab (<3 hours/day)     Assistance Recommended at Discharge Intermittent Supervision/Assistance  Patient can return home with the following  A lot of help with bathing/dressing/bathroom;Assistance with cooking/housework;Assist for transportation;Help with stairs or ramp for entrance;Two people to help  with walking and/or transfers   Equipment Recommendations  Other (comment) (defer to next venue)    Recommendations for Other Services      Precautions / Restrictions Precautions Precautions: Fall;Other (comment) Precaution Comments: L BKA (baseline) Restrictions Weight Bearing Restrictions: No LLE Weight Bearing: Weight bearing as tolerated       Mobility Bed Mobility Overal bed mobility: Needs Assistance Bed Mobility: Supine to Sit, Sit to Supine     Supine to sit: Min guard, HOB elevated Sit to supine: Min assist   General bed mobility comments: increased time to get to EOB and supine with requiring assistance with RLE    Transfers Overall transfer level: Needs assistance Equipment used: None               General transfer comment: attempted sit to stands to squat position with patient unable to completely clear bed.  Performed lateral scooting to right and left to simulate slide board transfers wtih min to mod assist     Balance Overall balance assessment: Needs assistance Sitting-balance support: Feet supported Sitting balance-Leahy Scale: Good Sitting balance - Comments: able to maintain balance while performing lateral scooting                                   ADL either performed or assessed with clinical judgement   ADL Overall ADL's : Needs assistance/impaired                                       General ADL Comments: declined self care tasks and transers to recilner or wheelchair    Extremity/Trunk Assessment  Vision       Perception     Praxis      Cognition Arousal/Alertness: Awake/alert Behavior During Therapy: WFL for tasks assessed/performed Overall Cognitive Status: No family/caregiver present to determine baseline cognitive functioning Area of Impairment: Safety/judgement, Awareness                     Memory: Decreased short-term memory   Safety/Judgement: Decreased  awareness of deficits, Decreased awareness of safety Awareness: Emergent   General Comments: declined self care and transfers        Exercises Exercises: General Upper Extremity General Exercises - Upper Extremity Shoulder Flexion: Strengthening, Both, 10 reps, Seated, Theraband Theraband Level (Shoulder Flexion): Level 2 (Red) Shoulder Extension: Strengthening, Both, 10 reps, Seated, Theraband Theraband Level (Shoulder Extension): Level 2 (Red) Shoulder ABduction: Strengthening, Both, 10 reps, Seated, Theraband Theraband Level (Shoulder Abduction): Level 2 (Red) Elbow Flexion: Strengthening, Both, 10 reps, Seated, Theraband Theraband Level (Elbow Flexion): Level 2 (Red) Elbow Extension: Strengthening, Both, 10 reps, Seated, Theraband Theraband Level (Elbow Extension): Level 2 (Red)    Shoulder Instructions       General Comments      Pertinent Vitals/ Pain       Pain Assessment Pain Assessment: Faces Faces Pain Scale: Hurts little more Pain Location: L hip, R ankle Pain Descriptors / Indicators: Aching, Discomfort, Grimacing, Guarding Pain Intervention(s): Limited activity within patient's tolerance, Monitored during session, Repositioned, Ice applied  Home Living                                          Prior Functioning/Environment              Frequency  Min 2X/week        Progress Toward Goals  OT Goals(current goals can now be found in the care plan section)  Progress towards OT goals: Not progressing toward goals - comment (limited by pain)  Acute Rehab OT Goals Patient Stated Goal: go home OT Goal Formulation: With patient Time For Goal Achievement: 02/25/22 Potential to Achieve Goals: Fair ADL Goals Pt Will Perform Grooming: with modified independence;sitting Pt Will Perform Lower Body Bathing: with supervision;sitting/lateral leans;with adaptive equipment Pt Will Perform Upper Body Dressing: Independently;sitting Pt Will  Perform Lower Body Dressing: Independently;sitting/lateral leans;bed level;sit to/from stand Pt Will Transfer to Toilet: with min guard assist;with transfer board;anterior/posterior transfer;bedside commode Pt Will Perform Toileting - Clothing Manipulation and hygiene: with min assist;sitting/lateral leans Pt Will Perform Tub/Shower Transfer: Tub transfer;Shower transfer;tub bench;ambulating Pt/caregiver will Perform Home Exercise Program: Increased ROM;Increased strength;Both right and left upper extremity;With written HEP provided;Independently;With theraband Additional ADL Goal #1: Pt will be able to tolerate sitting up in chair for 2 hours in order to improve activity tolerance for ADLs/IADLs Additional ADL Goal #2: Pt will be able to don prosthesis with min A in prep for ADLs/mobility  Plan Discharge plan remains appropriate;Frequency remains appropriate    Co-evaluation                 AM-PAC OT "6 Clicks" Daily Activity     Outcome Measure   Help from another person eating meals?: None Help from another person taking care of personal grooming?: A Little Help from another person toileting, which includes using toliet, bedpan, or urinal?: A Lot Help from another person bathing (including washing, rinsing, drying)?: A Lot Help from another person to put on  and taking off regular upper body clothing?: A Little Help from another person to put on and taking off regular lower body clothing?: A Lot 6 Click Score: 16    End of Session    OT Visit Diagnosis: Muscle weakness (generalized) (M62.81);Unsteadiness on feet (R26.81);History of falling (Z91.81)   Activity Tolerance Patient tolerated treatment well;Patient limited by pain (right ankle pain)   Patient Left in bed;with call bell/phone within reach;with nursing/sitter in room   Nurse Communication Mobility status;Other (comment) (patient asking for ice pack)        Time: 8830-1415 OT Time Calculation (min): 30  min  Charges: OT General Charges $OT Visit: 1 Visit OT Treatments $Therapeutic Activity: 8-22 mins $Therapeutic Exercise: 8-22 mins  [name], OTA Acute Rehabilitation Services  Office [phone]   [name] 02/13/2022, 12:42 PM

## 2022-02-14 DIAGNOSIS — N186 End stage renal disease: Secondary | ICD-10-CM | POA: Diagnosis not present

## 2022-02-14 DIAGNOSIS — E1169 Type 2 diabetes mellitus with other specified complication: Secondary | ICD-10-CM | POA: Diagnosis not present

## 2022-02-14 DIAGNOSIS — Z8673 Personal history of transient ischemic attack (TIA), and cerebral infarction without residual deficits: Secondary | ICD-10-CM | POA: Diagnosis not present

## 2022-02-14 DIAGNOSIS — M86252 Subacute osteomyelitis, left femur: Secondary | ICD-10-CM | POA: Diagnosis not present

## 2022-02-14 LAB — RENAL FUNCTION PANEL
Albumin: 3.6 g/dL (ref 3.5–5.0)
Anion gap: 14 (ref 5–15)
BUN: 84 mg/dL — ABNORMAL HIGH (ref 6–20)
CO2: 24 mmol/L (ref 22–32)
Calcium: 9 mg/dL (ref 8.9–10.3)
Chloride: 91 mmol/L — ABNORMAL LOW (ref 98–111)
Creatinine, Ser: 5.54 mg/dL — ABNORMAL HIGH (ref 0.44–1.00)
GFR, Estimated: 9 mL/min — ABNORMAL LOW (ref >60)
Glucose, Bld: 174 mg/dL — ABNORMAL HIGH (ref 70–99)
Phosphorus: 6.5 mg/dL — ABNORMAL HIGH (ref 2.5–4.6)
Potassium: 5 mmol/L (ref 3.5–5.1)
Sodium: 129 mmol/L — ABNORMAL LOW (ref 135–145)

## 2022-02-14 LAB — HEPATITIS B SURFACE ANTIGEN: Hepatitis B Surface Ag: NONREACTIVE

## 2022-02-14 LAB — GLUCOSE, CAPILLARY
Glucose-Capillary: 206 mg/dL — ABNORMAL HIGH (ref 70–99)
Glucose-Capillary: 191 mg/dL — ABNORMAL HIGH (ref 70–99)
Glucose-Capillary: 126 mg/dL — ABNORMAL HIGH (ref 70–99)

## 2022-02-14 MED ORDER — HEPARIN SODIUM (PORCINE) 1000 UNIT/ML DIALYSIS
2000.0000 [IU] | Freq: Once | INTRAMUSCULAR | Status: DC
  Filled 2022-02-14: qty 2

## 2022-02-14 MED ORDER — HEPARIN SODIUM (PORCINE) 1000 UNIT/ML DIALYSIS: 4000.0000 [IU] | Freq: Once | INTRAMUSCULAR | Status: DC

## 2022-02-14 MED ORDER — HEPARIN SODIUM (PORCINE) 1000 UNIT/ML DIALYSIS
4000.0000 [IU] | Freq: Once | INTRAMUSCULAR | Status: DC
  Filled 2022-02-14: qty 4

## 2022-02-14 NOTE — Progress Notes (Signed)
Subjective: Seen on HD without complaints.  Refused recliner dialysis, states may be next week.  Objective Vital signs in last 24 hours: Vitals:   02/14/22 0740 02/14/22 0755 02/14/22 0830 02/14/22 0900  BP: (!) 166/66 (!) 152/60 (!) 174/58 (!) 154/59  Pulse: 61 60    Resp: 16 16    Temp: 98.3 F (36.8 C)     TempSrc: Oral     SpO2: 99% 99%    Weight: 97.2 kg     Height:       Weight change:   Physical Exam: General: In hemodialysis alert adult female, O x 3 alert NAD Heart: RRR no MRG Lungs: CTA bilaterally nonlabored breathing Abdomen: Obese NABS, NTND Extremities: 1+ edema left leg to hip, 1+ edema right leg  Dialysis Access: LUA AVF patent on HD  Outpatient Dialysis Orders:  MWF at [facility], 400/500, EDW 90kg, 2K/2Ca, LUE AVF - heparin 4000 unit IV bolus, no ESA   Problem/Plan: 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 Completed 6 wk course of IV Daptomycin on 10/12/21. Per Dr. Jess Barters note 12/28/2021 "NO RESTRICTIONS L HIP".  Chronic L hip/residual limb pain: Hx fractures of L femoral neck (where hardware was removed) as well as distal femur Fx. Pain control per primary.  ESRD: Usual MWF schedule.   on Schedule today,  clots HD system without heparin, 4000 unit bolus and 2000 unit mid-run bolus. Refused HD last week d/t not liking the timing of it - managers aware and have dw patient.  Hyperkalemia: Stable on scheduled lokelma 4 days a week. HTN/volume: BP meds have been tapered down - currently on Coreg 3.125mg  BID only. Large fluids gains between HD which has been discussed extensively, Ongoing edema bilateral lower extremity,   Last Na 129 suggesting hypervolemia, UF as needed Anemia of ESRD: Hgb 11.2. Last tsat 54%. Aranesp on hold. Secondary HPTH: CorrCa ok, no VDRA. Phos 6.5 , continue Renvela as binder. Needs diet compliance  Nutrition: Alb 3.6 , continue protein supplements.  Renal diet w/fluid restrictions.   AVF  dysfunction (resolved): S/p branch ligation 09/19/2021  Dr. Virl Cagey. AVF working well now. DM2: Insulin per primary Dispo: SNF previously recommended, but she declined. Not home HD candidate.  Dialyzed in recliner on 9/4 but signed off early due to pain, has declined to come in a recliner since then.  Strongly discussed on the importance of increasing her mobility as much as possible while she is in her room in addition to following her ongoing fluid restriction. Also reinforced the importance of being in the chair for HD treatments to improve strength and mobility. Does not like to do PT after HD, tried to do HD in the afternoons so she can do PT in the AM but she refuses PT on HD days. Per Renal Navigator notes there are no clinics that will accept patient unless she will have HD in chair. Will continue to encourage use of chair for HD.    **This patient will not be seen over this weekend (12 16.02/16/22 ) unless emergency. If any renal/dialysis needs from 7a-5p, please page me if questions   Ernest Haber, PA-C [facility] Beeper [phone] 02/14/2022,9:21 AM  LOS: 172 days   Labs: Basic Metabolic Panel: Recent Labs  Lab 02/10/22 1549 02/12/22 0805 02/14/22 0753  NA 125* 128* 129*  K 5.2* 5.0 5.0  CL 87* 90* 91*  CO2 23 24 24   GLUCOSE 197* 156* 174*  BUN 115*  99* 84*  CREATININE 6.86* 6.11* 5.54*  CALCIUM 8.7* 8.8* 9.0  PHOS 6.6* 6.3* 6.5*   Liver Function Tests: Recent Labs  Lab 02/07/22 1233 02/12/22 0805 02/14/22 0753  ALBUMIN 3.9 3.7 3.6   No results for input(s): "LIPASE", "AMYLASE" in the last 168 hours. No results for input(s): "AMMONIA" in the last 168 hours. CBC: Recent Labs  Lab 02/07/22 1233 02/10/22 1549 02/12/22 0805  WBC 6.1 7.2 6.3  HGB 12.1 11.1* 11.2*  HCT 36.2 34.0* 32.8*  MCV 89.4 90.9 88.9  PLT 208 210 199   Cardiac Enzymes: No results for input(s): "CKTOTAL", "CKMB", "CKMBINDEX", "TROPONINI" in the last 168 hours. CBG: Recent  Labs  Lab 02/12/22 2053 02/13/22 0720 02/13/22 1134 02/13/22 1645 02/13/22 2100  GLUCAP 219* 126* 215*  215* 165* 170*    Studies/Results: No results found. Medications:  albumin human      (feeding supplement) PROSource Plus  30 mL Oral BID BM   alosetron  1 mg Oral BID   amitriptyline  50 mg Oral QHS   atorvastatin  40 mg Oral q1800   carvedilol  3.125 mg Oral BID   docusate sodium  200 mg Oral BID   fentaNYL  1 patch Transdermal Q72H   furosemide  80 mg Oral Daily   [START ON 02/15/2022] heparin  2,000 Units Dialysis Once in dialysis   heparin  4,000 Units Dialysis Once in dialysis   [START ON 02/15/2022] heparin  4,000 Units Dialysis Once in dialysis   hydrALAZINE  50 mg Oral TID   insulin aspart  0-9 Units Subcutaneous TID WC   insulin aspart  6 Units Subcutaneous TID WC   insulin glargine-yfgn  10 Units Subcutaneous QHS   methocarbamol  500 mg Oral QID   multivitamin  1 tablet Oral QHS   pantoprazole  40 mg Oral Daily   polyethylene glycol  17 g Oral Daily   sevelamer carbonate  2,400 mg Oral TID WC   sodium zirconium cyclosilicate  10 g Oral Once per day on Sun Tue Thu Sat

## 2022-02-14 NOTE — Progress Notes (Signed)
Progress Note   Patient: [name] EQA:[id number] DOB: November 09, 1965 DOA: 08/26/2021     172 DOS: the patient was seen and examined on 02/14/2022   Brief hospital course: This 56 year old female with PMH significant of hypertension, hyperlipidemia, diabetes mellitus type 2, end-stage renal disease on hemodialysis, status post left BKA for chronic calcaneus osteomyelitis on 01/12/2020, bipolar disorder, left distal femoral fracture after a fall on 07/19/2021 deemed  nonoperative, admission from 08/03/2021-08/23/2021 for left femoral osteomyelitis requiring partial resection of femur and rearrangements of bony fragments with subsequent antibiotic treatment presented to the hospital with left hip bleeding and was found to have subacute osteomyelitis and abscess of left femur.  Orthopedic/ID were consulted and patient was started on broad-spectrum antibiotics.  Patient underwent multiple I&D's by Dr. [name] along with local tissue rearrangement.  Patient was also seen by vascular surgery who did fistula branch ligation.  Patient has completed 6-week course of daptomycin on 10/09/2021.  She has been unable to complete hemodialysis in chair and outpatient hemodialysis unit has been hard to find. She is otherwise medically stable for discharge.      Assessment and Plan: * Subacute osteomyelitis of left femur with abscess  Status post surgical intervention by Dr. [name] with multiple left hip debridements.   Left BKA. She has completed a 6-week course of daptomycin on 10/09/2021.    Re-imaging on 12/02/2021 of left knee showed healing fracture with callus.    No left hip restrictions at this time.  Continue fentanyl patch, methocarbamol, and oxycodone.  Patient able to tolerate wheelchair for mobility.   ESRD (end stage renal disease) on dialysis ([place]) Sp fistula revision and branch ligation.  Hyponatremia, hyperphosphatemia, hyperkalemia.   Continue renal replacement therapy M-W-F.  Continue furosemide 80  mg daily.  Sodium zirconium on non HD days.   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 Continue with EPO  Follow up hgb 97.9   Metabolic bone disease, continue with sevelamer.   History of CVA (cerebrovascular accident) Continue statin   Type 2 diabetes mellitus with hyperlipidemia (HCC) A1C of 7.1 in 07/2021 Uncontrolled hyperglycemia.  Continue insulin therapy basal 10 units and pre meal insulin 6 units.    Continue with statin therapy.   Essential hypertension Continue blood pressure control with amlodipine, carvedilol and hydralazine.   Anxiety and depression Continue cymbalta and elavil at night   GASTROESOPHAGEAL REFLUX, NO ESOPHAGITIS Decrease pantoprazole to 40 mg to prevent further osteopenia.   TOBACCO DEPENDENCE Encouraged cessation, declines patch   Obesity (BMI 30.0-34.9) Calculated BMI 30.7 consistent with obesity class 1.         Subjective: Patient was able to tolerate wheelchair yesterday for mobility, today had HD with good toleration.   Physical Exam: Vitals:   02/14/22 1100 02/14/22 1130 02/14/22 1200 02/14/22 1239  BP: (!) 150/60 (!) 146/59 (!) 138/59 (!) 156/54  Pulse:   68 67  Resp:   13 18  Temp:   98.5 F (36.9 C) 98 F (36.7 C)  TempSrc:   Oral Oral  SpO2:   96% 96%  Weight:      Height:       Neurology awake and alert ENT with mild pallor Cardiovascular with S1 and S2 present and rhythmic with no gallops Respiratory with no rales or wheezing Abdomen with no distention  Left BKA, trace right lower extremity edema  Data Reviewed:    Family Communication: no family at the bediside   Disposition: Status  is: Inpatient Remains inpatient appropriate because: pending discharge disposition   Planned Discharge Destination: Home and to be determined       Author: Tawni Millers, MD 02/14/2022 1:37 PM  For on call review [email or web address].

## 2022-02-14 NOTE — Progress Notes (Signed)
  Treatment initiated: 0740 Treatment completed: 1200  Patient tolerated well.  Transported back to the room  Alert, without acute distress.  Hand-off given to patient's nurse.   Access used: AVF Access issues: NA  Total UF removed: 4L Medication(s) given: N/A   02/14/22 1200  Vitals  Temp 98.5 F (36.9 C)  Temp Source Oral  BP (!) 138/59  MAP (mmHg) 83  BP Location Right Arm  BP Method Automatic  Patient Position (if appropriate) Lying  Pulse Rate 68  Pulse Rate Source Monitor  ECG Heart Rate 69  Resp 13  Oxygen Therapy  SpO2 96 %  O2 Device Room Air  During Treatment Monitoring  Blood Flow Rate (mL/min) 400 mL/min  Arterial Pressure (mmHg) -20 mmHg  Venous Pressure (mmHg) 250 mmHg  TMP (mmHg) 18 mmHg  Ultrafiltration Rate (mL/min) 1250 mL/min  Dialysate Flow Rate (mL/min) 300 ml/min  HD Safety Checks Performed Yes  Intra-Hemodialysis Comments Tx completed  Post Treatment  Duration of HD Treatment -hour(s) 4 hour(s)  Liters Processed 96  Fluid Removed (mL) 4 mL  Tolerated HD Treatment Yes  AVG/AVF Arterial Site Held (minutes) 5 minutes  AVG/AVF Venous Site Held (minutes) 5 minutes          [facility]

## 2022-02-14 NOTE — Plan of Care (Signed)
  Problem: Pain Managment: Goal: General experience of comfort will improve Outcome: Progressing   Problem: Nutritional: Goal: Ability to make healthy dietary choices will improve Outcome: Progressing

## 2022-02-15 DIAGNOSIS — E1169 Type 2 diabetes mellitus with other specified complication: Secondary | ICD-10-CM | POA: Diagnosis not present

## 2022-02-15 DIAGNOSIS — M86252 Subacute osteomyelitis, left femur: Secondary | ICD-10-CM | POA: Diagnosis not present

## 2022-02-15 DIAGNOSIS — Z8673 Personal history of transient ischemic attack (TIA), and cerebral infarction without residual deficits: Secondary | ICD-10-CM | POA: Diagnosis not present

## 2022-02-15 DIAGNOSIS — N186 End stage renal disease: Secondary | ICD-10-CM | POA: Diagnosis not present

## 2022-02-15 LAB — GLUCOSE, CAPILLARY
Glucose-Capillary: 124 mg/dL — ABNORMAL HIGH (ref 70–99)
Glucose-Capillary: 230 mg/dL — ABNORMAL HIGH (ref 70–99)
Glucose-Capillary: 134 mg/dL — ABNORMAL HIGH (ref 70–99)
Glucose-Capillary: 255 mg/dL — ABNORMAL HIGH (ref 70–99)

## 2022-02-15 MED ORDER — ALPRAZOLAM 0.5 MG PO TABS
0.5000 mg | ORAL_TABLET | Freq: Three times a day (TID) | ORAL | Status: DC | PRN
  Administered 2022-02-15 – 2022-02-18 (×3): 0.5 mg via ORAL
  Filled 2022-02-15 (×3): qty 1

## 2022-02-15 NOTE — Progress Notes (Signed)
Progress Note   Patient: [name] DOB: 1966-02-19 DOA: 08/26/2021     173 DOS: the patient was seen and examined on 02/15/2022   Brief hospital course: This 56 year old female with PMH significant of hypertension, hyperlipidemia, diabetes mellitus type 2, end-stage renal disease on hemodialysis, status post left BKA for chronic calcaneus osteomyelitis on 01/12/2020, bipolar disorder, left distal femoral fracture after a fall on 07/19/2021 deemed  nonoperative, admission from 08/03/2021-08/23/2021 for left femoral osteomyelitis requiring partial resection of femur and rearrangements of bony fragments with subsequent antibiotic treatment presented to the hospital with left hip bleeding and was found to have subacute osteomyelitis and abscess of left femur.  Orthopedic/ID were consulted and patient was started on broad-spectrum antibiotics.  Patient underwent multiple I&D's by Dr. [name] along with local tissue rearrangement.  Patient was also seen by vascular surgery who did fistula branch ligation.  Patient has completed 6-week course of daptomycin on 10/09/2021.  She has been unable to complete hemodialysis in chair and outpatient hemodialysis unit has been hard to find. She is otherwise medically stable for discharge.      Assessment and Plan: * Subacute osteomyelitis of left femur with abscess  Status post surgical intervention by Dr. [name] with multiple left hip debridements.   Left BKA. She has completed a 6-week course of daptomycin on 10/09/2021.    Re-imaging on 12/02/2021 of left knee showed healing fracture with callus.    No left hip restrictions at this time.  Continue fentanyl patch, methocarbamol, and oxycodone.  Patient able to tolerate wheelchair for mobility.   ESRD (end stage renal disease) on dialysis ([place]) Sp fistula revision and branch ligation.  Hyponatremia, hyperphosphatemia, hyperkalemia.   Continue renal replacement therapy M-W-F.  Continue furosemide 80  mg daily.  Sodium zirconium on non HD days.   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 Continue with EPO   Metabolic bone disease, continue with sevelamer.   History of CVA (cerebrovascular accident) Continue statin   Type 2 diabetes mellitus with hyperlipidemia (HCC) A1C of 7.1 in 07/2021 Uncontrolled hyperglycemia.  Continue insulin therapy basal 10 units and pre meal insulin 6 units.   Capillary glucose 191, 126, 124.   Continue with statin therapy.   Essential hypertension Continue blood pressure control with amlodipine, carvedilol and hydralazine.   Anxiety and depression Continue cymbalta and elavil at night  Patient with anxiety, reported "crying all the time", will add alprazolam.   GASTROESOPHAGEAL REFLUX, NO ESOPHAGITIS Decrease pantoprazole to 40 mg to prevent further osteopenia.   TOBACCO DEPENDENCE Encouraged cessation, declines patch   Obesity (BMI 30.0-34.9) Calculated BMI 30.7 consistent with obesity class 1.         Subjective: Patient with anxiety and reported "crying all the time". No chest pain or dyspnea, lower extremity pain is controlled with analgesics.   Physical Exam: Vitals:   02/14/22 1239 02/14/22 1614 02/14/22 2038 02/15/22 0949  BP: (!) 156/54 (!) 169/58 (!) 160/60 (!) 142/57  Pulse: 67 67 65 63  Resp: 18 18 18 18   Temp: 98 F (36.7 C) 98.3 F (36.8 C) 97.6 F (36.4 C) 98.4 F (36.9 C)  TempSrc: Oral Oral Oral Oral  SpO2: 96% 98% 98% 99%  Weight:      Height:       Neurology awake and alert ENT with no pallor Cardiovascular with S1 and S2 present and rhythmic Respiratory with no rales or wheezing Abdomen with no distention  Left BKA,  Data Reviewed:    Family Communication: no family at the bedside   Disposition: Status is: Inpatient Remains inpatient appropriate because: pending discharge disposition   Planned Discharge Destination: Home      Author: Tawni Millers,  MD 02/15/2022 11:30 AM  For on call review [email or web address].

## 2022-02-16 DIAGNOSIS — Z8673 Personal history of transient ischemic attack (TIA), and cerebral infarction without residual deficits: Secondary | ICD-10-CM | POA: Diagnosis not present

## 2022-02-16 DIAGNOSIS — N186 End stage renal disease: Secondary | ICD-10-CM | POA: Diagnosis not present

## 2022-02-16 DIAGNOSIS — E1169 Type 2 diabetes mellitus with other specified complication: Secondary | ICD-10-CM | POA: Diagnosis not present

## 2022-02-16 DIAGNOSIS — M86252 Subacute osteomyelitis, left femur: Secondary | ICD-10-CM | POA: Diagnosis not present

## 2022-02-16 LAB — GLUCOSE, CAPILLARY
Glucose-Capillary: 145 mg/dL — ABNORMAL HIGH (ref 70–99)
Glucose-Capillary: 214 mg/dL — ABNORMAL HIGH (ref 70–99)
Glucose-Capillary: 155 mg/dL — ABNORMAL HIGH (ref 70–99)
Glucose-Capillary: 183 mg/dL — ABNORMAL HIGH (ref 70–99)
Glucose-Capillary: 189 mg/dL — ABNORMAL HIGH (ref 70–99)

## 2022-02-16 LAB — BASIC METABOLIC PANEL
Anion gap: 13 (ref 5–15)
BUN: 81 mg/dL — ABNORMAL HIGH (ref 6–20)
CO2: 26 mmol/L (ref 22–32)
Calcium: 9.3 mg/dL (ref 8.9–10.3)
Chloride: 90 mmol/L — ABNORMAL LOW (ref 98–111)
Creatinine, Ser: 5.88 mg/dL — ABNORMAL HIGH (ref 0.44–1.00)
GFR, Estimated: 8 mL/min — ABNORMAL LOW (ref >60)
Glucose, Bld: 150 mg/dL — ABNORMAL HIGH (ref 70–99)
Potassium: 5 mmol/L (ref 3.5–5.1)
Sodium: 129 mmol/L — ABNORMAL LOW (ref 135–145)

## 2022-02-16 NOTE — Progress Notes (Addendum)
Progress Note   Patient: [name] SWH:[id number] DOB: 1965/08/04 DOA: 08/26/2021     174 DOS: the patient was seen and examined on 02/16/2022   Brief hospital course: This 56 year old female with PMH significant of hypertension, hyperlipidemia, diabetes mellitus type 2, end-stage renal disease on hemodialysis, status post left BKA for chronic calcaneus osteomyelitis on 01/12/2020, bipolar disorder, left distal femoral fracture after a fall on 07/19/2021 deemed  nonoperative, admission from 08/03/2021-08/23/2021 for left femoral osteomyelitis requiring partial resection of femur and rearrangements of bony fragments with subsequent antibiotic treatment presented to the hospital with left hip bleeding and was found to have subacute osteomyelitis and abscess of left femur.  Orthopedic/ID were consulted and patient was started on broad-spectrum antibiotics.  Patient underwent multiple I&D's by Dr. [name] along with local tissue rearrangement.  Patient was also seen by vascular surgery who did fistula branch ligation.  Patient has completed 6-week course of daptomycin on 10/09/2021.  She has been unable to complete hemodialysis in chair and outpatient hemodialysis unit has been hard to find. She is otherwise medically stable for discharge.      Assessment and Plan: * Subacute osteomyelitis of left femur with abscess  Status post surgical intervention by Dr. [name] with multiple left hip debridements.   Left BKA. She has completed a 6-week course of daptomycin on 10/09/2021.    Re-imaging on 12/02/2021 of left knee showed healing fracture with callus.    No left hip restrictions at this time.  Continue fentanyl patch, methocarbamol, and oxycodone.  Patient able to tolerate wheelchair for mobility.   ESRD (end stage renal disease) on dialysis ([place]) Sp fistula revision and branch ligation.  Hyponatremia, hyperphosphatemia, hyperkalemia.   Continue renal replacement therapy M-W-F.  Continue furosemide 80  mg daily.  Sodium zirconium on non HD days.   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 Continue with EPO   Metabolic bone disease, continue with sevelamer.   History of CVA (cerebrovascular accident) Continue statin   Type 2 diabetes mellitus with hyperlipidemia (HCC) A1C of 7.1 in 07/2021 Uncontrolled hyperglycemia.  Continue insulin therapy basal 10 units and pre meal insulin 6 units.   Capillary glucose 145, 214 and 155.   Continue with statin therapy.   Essential hypertension Continue blood pressure control with amlodipine, carvedilol and hydralazine.   Anxiety and depression Continue cymbalta and elavil at night  With alprazolam her anxiety has improved.   GASTROESOPHAGEAL REFLUX, NO ESOPHAGITIS Continue with pantoprazole 40 mg daily.   TOBACCO DEPENDENCE Encouraged cessation, declines patch   Obesity (BMI 30.0-34.9) Calculated BMI 30.7 consistent with obesity class 1.         Subjective: Patient with improved anxiety, with no chest pain or dyspnea, lower extremity pain is controlled with analgesics   Physical Exam: Vitals:   02/15/22 1630 02/15/22 2108 02/16/22 0635 02/16/22 0917  BP: (!) 171/62 (!) 165/57 (!) 155/69 (!) 163/60  Pulse: (!) 59 63 69 66  Resp: 18 18 18 18   Temp: 98 F (36.7 C) 98.2 F (36.8 C) 98.4 F (36.9 C) 98 F (36.7 C)  TempSrc: Oral Oral Oral Oral  SpO2: 100% 97% 98% 100%  Weight:      Height:       Neurology awake and alert ENT with mild pallor Cardiovascular with S1 and S2 present and rhythmic with no gallops or murmurs Respiratory with no rales or wheezing Abdomen with no distention  Left BKA  Data Reviewed:  Family Communication: no family at the bedside   Disposition: Status is: Inpatient Remains inpatient appropriate because: pending placement   Planned Discharge Destination: Home      Author: Tawni Millers, MD 02/16/2022 3:32 PM  For on call review  [email or web address].

## 2022-02-17 DIAGNOSIS — M86252 Subacute osteomyelitis, left femur: Secondary | ICD-10-CM | POA: Diagnosis not present

## 2022-02-17 DIAGNOSIS — E1169 Type 2 diabetes mellitus with other specified complication: Secondary | ICD-10-CM | POA: Diagnosis not present

## 2022-02-17 DIAGNOSIS — I10 Essential (primary) hypertension: Secondary | ICD-10-CM | POA: Diagnosis not present

## 2022-02-17 DIAGNOSIS — N186 End stage renal disease: Secondary | ICD-10-CM | POA: Diagnosis not present

## 2022-02-17 LAB — RENAL FUNCTION PANEL
Albumin: 3.7 g/dL (ref 3.5–5.0)
Anion gap: 15 (ref 5–15)
BUN: 99 mg/dL — ABNORMAL HIGH (ref 6–20)
CO2: 23 mmol/L (ref 22–32)
Calcium: 8.7 mg/dL — ABNORMAL LOW (ref 8.9–10.3)
Chloride: 89 mmol/L — ABNORMAL LOW (ref 98–111)
Creatinine, Ser: 7.01 mg/dL — ABNORMAL HIGH (ref 0.44–1.00)
GFR, Estimated: 6 mL/min — ABNORMAL LOW (ref >60)
Glucose, Bld: 164 mg/dL — ABNORMAL HIGH (ref 70–99)
Phosphorus: 6.5 mg/dL — ABNORMAL HIGH (ref 2.5–4.6)
Potassium: 5 mmol/L (ref 3.5–5.1)
Sodium: 127 mmol/L — ABNORMAL LOW (ref 135–145)

## 2022-02-17 LAB — CBC
HCT: 33.9 % — ABNORMAL LOW (ref 36.0–46.0)
Hemoglobin: 11.4 g/dL — ABNORMAL LOW (ref 12.0–15.0)
MCH: 30.3 pg (ref 26.0–34.0)
MCHC: 33.6 g/dL (ref 30.0–36.0)
MCV: 90.2 fL (ref 80.0–100.0)
Platelets: 211 10*3/uL (ref 150–400)
RBC: 3.76 MIL/uL — ABNORMAL LOW (ref 3.87–5.11)
RDW: 13.7 % (ref 11.5–15.5)
WBC: 7.1 10*3/uL (ref 4.0–10.5)
nRBC: 0 % (ref 0.0–0.2)

## 2022-02-17 LAB — GLUCOSE, CAPILLARY
Glucose-Capillary: 153 mg/dL — ABNORMAL HIGH (ref 70–99)
Glucose-Capillary: 151 mg/dL — ABNORMAL HIGH (ref 70–99)
Glucose-Capillary: 230 mg/dL — ABNORMAL HIGH (ref 70–99)
Glucose-Capillary: 90 mg/dL (ref 70–99)

## 2022-02-17 MED ORDER — HEPARIN SODIUM (PORCINE) 1000 UNIT/ML IJ SOLN
INTRAMUSCULAR | Status: AC
  Filled 2022-02-17: qty 4

## 2022-02-17 MED ORDER — HEPARIN SODIUM (PORCINE) 1000 UNIT/ML IJ SOLN
INTRAMUSCULAR | Status: AC
  Administered 2022-02-17: 2000 [IU]
  Filled 2022-02-17: qty 2

## 2022-02-17 NOTE — Progress Notes (Signed)
PT Cancellation Note  Patient Details Name: [name] MRN: [id number] DOB: Jan 25, 1966   Cancelled Treatment:    Reason Eval/Treat Not Completed: Patient at procedure or test/unavailable  Currently in HD;  Will follow up later today as time allows, she tends to decline PT on HD days, but given success of last session, will consider offering her the opportunity for a PT session;  Otherwise, will follow up for PT tomorrow;   Thank you,  Roney Marion, [place] [facility] [place] 02/17/2022, 9:21 AM

## 2022-02-17 NOTE — Progress Notes (Signed)
[facility] Progress Note   Subjective:   Seen prior to HD, hiccups today but no other concerns. Denies SOB, CP, dizziness, abdominal pain and nausea.   Objective Vitals:   02/16/22 0917 02/16/22 1533 02/16/22 2109 02/17/22 0625  BP: (!) 163/60 (!) 165/57 (!) 162/65 (!) 161/56  Pulse: 66 69 61 (!) 55  Resp: 18 18 18 18   Temp: 98 F (36.7 C) 98.2 F (36.8 C) 98.4 F (36.9 C) 98.2 F (36.8 C)  TempSrc: Oral Oral Oral Oral  SpO2: 100% 99% 99% 100%  Weight:      Height:       Physical Exam General: [name] Heart: RRR, no murmurs, rubs or gallops Lungs: CTA bilaterally without wheezing, rhonchi or rales Abdomen: Soft, non-distended, +BS. + hiccups Extremities: 1+ edema L hip, trace edema RLE Dialysis Access: LUE AVF + bruit  Additional Objective Labs: Basic Metabolic Panel: Recent Labs  Lab 02/10/22 1549 02/12/22 0805 02/14/22 0753 02/16/22 0910  NA 125* 128* 129* 129*  K 5.2* 5.0 5.0 5.0  CL 87* 90* 91* 90*  CO2 23 24 24 26   GLUCOSE 197* 156* 174* 150*  BUN 115* 99* 84* 81*  CREATININE 6.86* 6.11* 5.54* 5.88*  CALCIUM 8.7* 8.8* 9.0 9.3  PHOS 6.6* 6.3* 6.5*  --    Liver Function Tests: Recent Labs  Lab 02/12/22 0805 02/14/22 0753  ALBUMIN 3.7 3.6   No results for input(s): "LIPASE", "AMYLASE" in the last 168 hours. CBC: Recent Labs  Lab 02/10/22 1549 02/12/22 0805  WBC 7.2 6.3  HGB 11.1* 11.2*  HCT 34.0* 32.8*  MCV 90.9 88.9  PLT 210 199   Blood Culture    Component Value Date/Time   SDES TISSUE 08/28/2021 0917   SPECREQUEST LEFT THIGH TISSUE 08/28/2021 0917   CULT  08/28/2021 0917    MODERATE ENTEROCOCCUS FAECIUM VANCOMYCIN RESISTANT ENTEROCOCCUS ISOLATED NO ANAEROBES ISOLATED Sent to [place] for further susceptibility testing. Performed at [facility], [place] [address] [place], [place] [address]    REPTSTATUS 09/02/2021 FINAL 08/28/2021 1115    Cardiac Enzymes: No results for input(s): "CKTOTAL",  "CKMB", "CKMBINDEX", "TROPONINI" in the last 168 hours. CBG: Recent Labs  Lab 02/16/22 1119 02/16/22 1253 02/16/22 1622 02/16/22 2108 02/17/22 0719  GLUCAP 214* 155* 183* 189* 153*   Iron Studies: No results for input(s): "IRON", "TIBC", "TRANSFERRIN", "FERRITIN" in the last 72 hours. @lablastinr3 @ Studies/Results: No results found. Medications:  albumin human      (feeding supplement) PROSource Plus  30 mL Oral BID BM   alosetron  1 mg Oral BID   amitriptyline  50 mg Oral QHS   atorvastatin  40 mg Oral q1800   carvedilol  3.125 mg Oral BID   docusate sodium  200 mg Oral BID   fentaNYL  1 patch Transdermal Q72H   furosemide  80 mg Oral Daily   heparin sodium (porcine)       hydrALAZINE  50 mg Oral TID   insulin aspart  0-9 Units Subcutaneous TID WC   insulin aspart  6 Units Subcutaneous TID WC   insulin glargine-yfgn  10 Units Subcutaneous QHS   methocarbamol  500 mg Oral QID   multivitamin  1 tablet Oral QHS   pantoprazole  40 mg Oral Daily   polyethylene glycol  17 g Oral Daily   sevelamer carbonate  2,400 mg Oral TID WC   sodium zirconium cyclosilicate  10 g Oral Once per day on Sun Tue Thu  Sat    Dialysis Orders: MWF at [facility], 400/500, EDW 90kg, 2K/2Ca, LUE AVF - heparin 4000 unit IV bolus, no ESA  Assessment/Plan: 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 Completed 6 wk course of IV Daptomycin on 10/12/21. Per Dr. Jess Barters note 12/28/2021 "NO RESTRICTIONS L HIP".  Chronic L hip/residual limb pain: Hx fractures of L femoral neck (where hardware was removed) as well as distal femur Fx. Pain control per primary.  ESRD: Usual MWF schedule.   on Schedule today,  clots HD system without heparin, 4000 unit bolus and 2000 unit mid-run bolus.  Hyperkalemia: Stable on scheduled lokelma 4 days a week. HTN/volume: BP meds have been tapered down - currently on Coreg 3.125mg  BID only. Large fluids gains between HD which has  been discussed extensively, Ongoing edema bilateral lower extremity,   Last Na 129 suggesting hypervolemia Anemia of ESRD: Hgb 11.2. Last tsat 54%. Aranesp on hold. Secondary HPTH: CorrCa ok, no VDRA. Phos 6.5 , continue Renvela as binder. Needs diet compliance  Nutrition: Alb 3.6 , continue protein supplements.  Renal diet w/fluid restrictions.   AVF dysfunction (resolved): S/p branch ligation 09/19/2021  Dr. Virl Cagey. AVF working well now. DM2: Insulin per primary Dispo: SNF previously recommended, but she declined. Not home HD candidate.  Dialyzed in recliner on 9/4 but signed off early due to pain, has declined to come in a recliner since then.  Strongly discussed on the importance of increasing her mobility as much as possible while she is in her room in addition to following her ongoing fluid restriction. Also reinforced the importance of being in the chair for HD treatments to improve strength and mobility. Does not like to do PT after HD, tried to do HD in the afternoons so she can do PT in the AM but she refuses PT on HD days. Per Renal Navigator notes there are no clinics that will accept patient unless she will have HD in chair. Will continue to encourage use of chair for HD.   Anice Paganini, PA-C 02/17/2022, 8:23 AM  [facility] Pager: [phone]

## 2022-02-17 NOTE — Progress Notes (Signed)
PROGRESS NOTE    [name]  VCB:[id number] DOB: 1965-05-12 DOA: 08/26/2021 PCP: [name], MD   Brief Narrative: This 56 year old female with PMH significant of hypertension, hyperlipidemia, diabetes mellitus type 2, end-stage renal disease on hemodialysis, status post left BKA for chronic calcaneus osteomyelitis on 01/12/2020, bipolar disorder, left distal femoral fracture after a fall on 07/19/2021 deemed  nonoperative, admission from 08/03/2021-08/23/2021 for left femoral osteomyelitis requiring partial resection of femur and rearrangements of bony fragments with subsequent antibiotic treatment presented to the hospital with left hip bleeding and was found to have subacute osteomyelitis and abscess of left femur.  Orthopedic/ID were consulted and patient was started on broad-spectrum antibiotics.  Patient underwent multiple I&D's by Dr. [name] along with local tissue rearrangement.  Patient was also seen by vascular surgery who did fistula branch ligation.  Patient has completed 6-week course of daptomycin on 10/09/2021.  She has been unable to complete hemodialysis in chair and outpatient hemodialysis unit has been hard to find. She is otherwise medically stable for discharge.       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 Orthopedic surgery consulted. ID consulted. Patient underwent hardware removal on 6/10 (prior to admission) with I&D on 6/28 and 7/1. Infectious disease recommendations for 6 weeks of Daptomycin; patient has completed course. Patient still with pain. Afebrile. No leukocytosis.  History of left intertrochanteric fracture This was previously managed with IM nail placement in 2021. Hardware removed secondary to above.  ESRD on HD Nephrology on board. -HD per nephrology  Anasarca Improved with hemodialysis.  Hyponatremia Management with  hemodialysis.  Hypokalemia Hyperkalemia Management with hemodialysis  Anemia of chronic disease Acute blood loss anemia In setting of CKD in addition to blood loss from wound. Patient has received a total of 2 units of PRBC via transfusion. Hemoglobin stable.  Diabetes mellitus type 2 Diabetes is uncontrolled with hyperglycemia. Most recent hemoglobin A1C of 7.1%. Patient is on insulin glargine and insulin aspart as an outpatient. -Continue Semglee 10 units qHS, Novolog 6 units TID with meals and SSI  Primary hypertension -Continue hydralazine and Coreg  Hyperlipidemia History of CVA -Continue Lipitor  GERD -Continue Pepcid and Protonix  Tobacco use Tobacco cessation discussed during this admission.  DVT prophylaxis: Heparin subq Code Status:   Code Status: Full Code Family Communication: None at bedside Disposition Plan: Discharge to SNF. Medically stable for discharge. Needs to tolerate HD while in a chair.   Consultants:  Orthopedic surgery Infectious disease Nephrology Vascular surgery  Procedures:  Vascular ultrasound duplex dialysis access 09/07/2021 Fistula revision branch ligation per Dr. Unk Lightning 09/19/2021- Left hip debridement, application of wound VAC by Dr. [name] 08/28/2021  Antimicrobials: Daptomycin Cefazolin   Subjective: In hemodialysis. No issues at this time.  Objective: BP (!) 161/56 (BP Location: Right Arm)   Pulse (!) 55   Temp 98.2 F (36.8 C) (Oral)   Resp 18   Ht 5\' 10"  (1.778 m)   Wt 97.2 kg   SpO2 100%   BMI 30.75 kg/m   Examination:  General exam: Appears calm and comfortable Respiratory system: Clear to auscultation. Respiratory effort normal. Cardiovascular system: S1 & S2 heard, RRR. Gastrointestinal system: Abdomen is nondistended, soft and nontender. Normal bowel sounds heard. Central nervous system: Alert and oriented. No focal neurological deficits. Musculoskeletal: Trace edema. Skin: No cyanosis. No  rashes Psychiatry: Judgement and insight appear normal. Mood & affect appropriate.  Data Reviewed: I have personally reviewed following labs and imaging studies  CBC Lab Results  Component Value Date   WBC 6.3 02/12/2022   RBC 3.69 (L) 02/12/2022   HGB 11.2 (L) 02/12/2022   HCT 32.8 (L) 02/12/2022   MCV 88.9 02/12/2022   MCH 30.4 02/12/2022   PLT 199 02/12/2022   MCHC 34.1 02/12/2022   RDW 13.9 02/12/2022   LYMPHSABS 1.8 02/03/2022   MONOABS 0.9 02/03/2022   EOSABS 0.2 02/03/2022   BASOSABS 0.1 10/13/8869     Last metabolic panel Lab Results  Component Value Date   NA 129 (L) 02/16/2022   K 5.0 02/16/2022   CL 90 (L) 02/16/2022   CO2 26 02/16/2022   BUN 81 (H) 02/16/2022   CREATININE 5.88 (H) 02/16/2022   GLUCOSE 150 (H) 02/16/2022   GFRNONAA 8 (L) 02/16/2022   GFRAA 13 (L) 12/17/2018   CALCIUM 9.3 02/16/2022   PHOS 6.5 (H) 02/14/2022   PROT 7.6 12/02/2021   ALBUMIN 3.6 02/14/2022   LABGLOB 3.1 10/29/2018   AGRATIO 1.4 10/29/2018   BILITOT 0.6 12/02/2021   ALKPHOS 105 12/02/2021   AST 16 12/02/2021   ALT 15 12/02/2021   ANIONGAP 13 02/16/2022    GFR: Estimated Creatinine Clearance: 13.8 mL/min (A) (by C-G formula based on SCr of 5.88 mg/dL (H)).  No results found for this or any previous visit (from the past 240 hour(s)).    Radiology Studies: No results found.    LOS: 175 days    Cordelia Poche, MD [facility] 02/17/2022, 7:28 AM   If 7PM-7AM, please contact night-coverage [email or web address]

## 2022-02-17 NOTE — Progress Notes (Signed)
Received patient in bed to unit.  Alert and oriented.  Informed consent signed and in chart.   Treatment initiated:8:44 Treatment completed: 12:47  Patient tolerated well.  Transported back to the room  Alert, without acute distress.  Hand-off given to patient's nurse.   Access used: Left AVF Access issues: none  Total UF removed: 3.9 Medication(s) given: none    02/17/22 1247  Vitals  Temp 98.2 F (36.8 C)  BP (!) 127/57  MAP (mmHg) 76  BP Location Right Arm  BP Method Automatic  Patient Position (if appropriate) Lying  Pulse Rate 63  Pulse Rate Source Monitor  Resp 11  Oxygen Therapy  SpO2 98 %  O2 Device Room Air  Patient Activity (if Appropriate) In bed  During Treatment Monitoring  Blood Flow Rate (mL/min) 400 mL/min  Arterial Pressure (mmHg) -120 mmHg  Venous Pressure (mmHg) 270 mmHg  TMP (mmHg) 12 mmHg  Ultrafiltration Rate (mL/min) 1002 mL/min  Dialysate Flow Rate (mL/min) 300 ml/min  HD Safety Checks Performed Yes  Intra-Hemodialysis Comments Tx completed  Post Treatment  Dialyzer Clearance Lightly streaked  Duration of HD Treatment -hour(s) 4 hour(s)  Liters Processed 96  Fluid Removed (mL) 3.9 mL  Tolerated HD Treatment Yes  AVG/AVF Arterial Site Held (minutes) 5 minutes  AVG/AVF Venous Site Held (minutes) 5 minutes  Vitals  Weight 94.6 kg      Samer Dutton S [facility]

## 2022-02-18 DIAGNOSIS — N186 End stage renal disease: Secondary | ICD-10-CM | POA: Diagnosis not present

## 2022-02-18 DIAGNOSIS — E1169 Type 2 diabetes mellitus with other specified complication: Secondary | ICD-10-CM | POA: Diagnosis not present

## 2022-02-18 DIAGNOSIS — M86252 Subacute osteomyelitis, left femur: Secondary | ICD-10-CM | POA: Diagnosis not present

## 2022-02-18 DIAGNOSIS — I10 Essential (primary) hypertension: Secondary | ICD-10-CM | POA: Diagnosis not present

## 2022-02-18 LAB — GLUCOSE, CAPILLARY
Glucose-Capillary: 140 mg/dL — ABNORMAL HIGH (ref 70–99)
Glucose-Capillary: 200 mg/dL — ABNORMAL HIGH (ref 70–99)
Glucose-Capillary: 126 mg/dL — ABNORMAL HIGH (ref 70–99)
Glucose-Capillary: 176 mg/dL — ABNORMAL HIGH (ref 70–99)

## 2022-02-18 MED ORDER — CHLORHEXIDINE GLUCONATE CLOTH 2 % EX PADS
6.0000 | MEDICATED_PAD | Freq: Every day | CUTANEOUS | Status: DC
  Administered 2022-02-18 – 2022-02-19 (×2): 6 via TOPICAL

## 2022-02-18 NOTE — TOC Progression Note (Signed)
Transition of Care [facility]) - Initial/Assessment Note    Patient Details  Name: [name] MRN: [id number] Date of Birth: 1965/06/25  [facility]) CM/SW Contact:    Milinda Antis, [place] Phone Number: 02/18/2022, 12:26 PM  Clinical Narrative:                 LCSW and RNCM met with the patient at bedside to discuss disposition.  The patient is adamant about not discharging to a SNF.  The patient wants to discharge home, but states that she cannot walk and she may "be here a while" until she can walk.  The patient also reports that assistance will be needed to get from the home to a car to be transported to dialysis as the patient's only support, her daughter, works "all day".  The patient reports having a ramp at the home.    RNCM informed the patient that TOC could work on securing transportation to and from dialysis and the patient declined.  "I don't want to be on there with all those people and it may take a while to get there.  I have stomach issues."  The patient reports that her daughter will transport until the patient has the ability to drive again.  RNCM presented the idea of home health services along with the possibility of PCS services to assist with preparing to go to dialysis and returning from dialysis.  Patient in agreement.  LCSW and RNCM engaged with the patient about the importance of sitting in the chair for dialysis treatment.  The patient reports that she is willing to sit in a chair, but "cannot to sit for 5 or 6 hours" due to having to wait for long periods of time after the treatment is completed for transport services to arrive.  RNCM presented the solution of the floor RN and dialysis unit coordinating to ensure that the patient does not have to wait for an extended period of time for transport services.  The patient then reported that the pain in her hip was the cause of not being able to sit.  RNCM and LCSW encouraged the patient to start by sitting for  meals, not on the EOB but in the chair, and increase the time daily so that the patient is spending more daytime hours in the chair rather than in the bed.  The patient agreed.    The patient is also agreeable to a multidisciplinary team meeting with the patient and daughter.  Floor AD and PT were informed and in agreement with plan.    TOC following.  Expected Discharge Plan: Skilled Nursing Facility Barriers to Discharge: Continued Medical Work up   Patient Goals and CMS Choice Patient states their goals for this hospitalization and ongoing recovery are:: To return home CMS Medicare.gov Compare Post Acute Care list provided to:: Patient Choice offered to / list presented to : NA  Expected Discharge Plan and Services Expected Discharge Plan: [place]   Discharge Planning Services: CM Consult   Living arrangements for the past 2 months: Single Family Home                                      Prior Living Arrangements/Services Living arrangements for the past 2 months: Single Family Home Lives with:: Adult Children Patient language and need for interpreter reviewed:: Yes Do you feel safe going back to the  place where you live?: Yes      Need for Family Participation in Patient Care: Yes (Comment) Care giver support system in place?: Yes (comment) Current home services: DME ([employer], w/c) Criminal Activity/Legal Involvement Pertinent to Current Situation/Hospitalization: No - Comment as needed  Activities of Daily Living Home Assistive Devices/Equipment: Eyeglasses, Wheelchair ADL Screening (condition at time of admission) Patient's cognitive ability adequate to safely complete daily activities?: Yes Is the patient deaf or have difficulty hearing?: No Does the patient have difficulty seeing, even when wearing glasses/contacts?: No Does the patient have difficulty concentrating, remembering, or making decisions?: No Patient able to express need for  assistance with ADLs?: Yes Does the patient have difficulty dressing or bathing?: Yes Independently performs ADLs?: No Communication: Independent Dressing (OT): Independent Is this a change from baseline?: Pre-admission baseline Grooming: Independent Feeding: Independent Bathing: Needs assistance Is this a change from baseline?: Pre-admission baseline Toileting: Needs assistance Is this a change from baseline?: Pre-admission baseline In/Out Bed: Needs assistance, Dependent Is this a change from baseline?: Pre-admission baseline Walks in Home: Dependent Is this a change from baseline?: Pre-admission baseline Does the patient have difficulty walking or climbing stairs?: Yes Weakness of Legs: Both Weakness of Arms/Hands: Both  Permission Sought/Granted Permission sought to share information with : Case Manager, Family Supports Permission granted to share information with : Yes, Verbal Permission Granted              Emotional Assessment Appearance:: Appears stated age Attitude/Demeanor/Rapport: Engaged, Gracious Affect (typically observed): Accepting, Appropriate, Calm, Hopeful Orientation: : Oriented to Self, Oriented to Place, Oriented to  Time, Oriented to Situation Alcohol / Substance Use: Not Applicable Psych Involvement: No (comment)  Admission diagnosis:  Wound infection [T14.8XXA, L08.9] Abscess of left hip [L02.416] Patient Active Problem List   Diagnosis Date Noted   Wound dehiscence, surgical, sequela    Abscess of left hip 08/26/2021   History of CVA (cerebrovascular accident) 94/17/4081   Hardware complicating wound infection ([place])    Subacute osteomyelitis of left femur with abscess     Septic arthritis of hip ([place]) 07/30/2021   Skin abscess L hip 07/28/2021   HCAP (healthcare-associated pneumonia) 07/27/2021   Hyperkalemia 44/81/8563   Metabolic acidemia 14/97/0263   Closed fracture of left distal femur ([place]) 07/22/2021   Type 2 diabetes mellitus with  hyperlipidemia ([place]) 07/22/2021   Infection of superficial incisional surgical site after procedure 03/09/2020   Abscess    Hypoglycemia    Essential hypertension    Sleep disturbance    ESRD (end stage renal disease) on dialysis ([place]) 01/24/2020   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[place]) 01/23/2020   Hyponatremia    Constipation    Chronic osteomyelitis involving left ankle and foot ([place])    Ulcer of left foot with necrosis of bone ([place])    Wound infection 12/28/2019   History of [place] amputation of left foot ([place]) 12/25/2017   History of CVA (cerebrovascular accident) without residual deficits 07/04/2017   Cerebral thrombosis with cerebral infarction 04/08/2017   Right sided weakness 04/07/2017   Hyperhidrosis 09/01/2016   Migraine with aura and without status migrainosus, not intractable 07/28/2016   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[place]) 08/04/2014   CKD stage 3 due to type 2 diabetes mellitus ([place]) 06/27/2014   Vitamin D insufficiency 05/08/2014   Obesity (BMI 30.0-34.9) 05/08/2014   Unilateral amputation of left foot ([place]) 05/08/2014   Bursitis of left shoulder 02/14/2014   Neck pain 01/03/2014   Atherosclerosis of  native arteries of the extremities with ulceration(440.23) 01/14/2013   Insomnia 08/04/2012   Diabetic neuropathy, painful ([place]) 08/04/2011   Anxiety and depression 05/16/2010   Female stress incontinence 11/01/2007   TOBACCO DEPENDENCE 04/30/2006   GASTROESOPHAGEAL REFLUX, NO ESOPHAGITIS 04/30/2006   Irritable bowel syndrome 04/30/2006   PCP:  [name], MD Pharmacy:   [facility], [place] [address] [place] [address] Phone: [phone] Fax: [phone]  [id number] [place], [id number]: [phone]: [phone]    Social Determinants of Health (SDOH)  Interventions    Readmission Risk Interventions    07/26/2021    3:15 PM  Readmission Risk Prevention Plan  Transportation Screening Complete  PCP or Specialist Appt within 3-5 Days Complete  HRI or Home Care Consult Complete  Palliative Care Screening Not Applicable  Medication Review (RN Care Manager) Referral to Pharmacy

## 2022-02-18 NOTE — Progress Notes (Signed)
Physical Therapy Treatment Patient Details Name: [name] MRN: [id number] DOB: January 20, 1966 Today's Date: 02/18/2022   History of Present Illness 56 y.o. female presented to ED 6/26 from dialysis with increased bloody drainage from L hip wound. 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 s/p hardware removal of left hip with partial resection of tuft tissue and femure that were nonviable on 08/10/21 Discharged home 6/23. underwent L hip debridement and wound vac placement 6/28; +Left intertrochanteric non-union,  Fracture of left distal femur  PMH: hypertension, hyperlipidemia, ESRD on HD MWF, history of left BKA in 2021, depression/anxiety, stroke, tobacco use, T2DM,  insomnia, chronic pain syndrome,    PT Comments    Continuing work on functional mobility and activity tolerance;  Pt seen in conjunction with OT to maximize pts activity tolerance and optimize pt participation. Pt limited by general malaise today with reports of nausea. Pt initially declined any mobilization d/t nausea therefore began session having collaborative educational session with OT on barriers to DC. Education provided that mobilization efforts seem to need to be focused on toilet transfers, toileting hygiene and w/c mobility, as well as general functional transfers and sitting tolerance to work towards pts ultimate goal of being MODI at home. During conversation pt agreeable to work towards goals but then when asked if pt can demo skills pt declines completing task and states "I've been doing that.";   With persistent and kind encouragement, pt agreed to try donning prosthesis (didn't quite fit), sit EOB, and perform some scooting towards [facility]; reinforced the need to wear liner/shrinker to get prosthesis fit; pt agreed to wear liner; padded the pin with a rollof Kerlix to  pad the pin of the liner  Recommendations for follow up therapy are one component of a multi-disciplinary discharge  planning process, led by the attending physician.  Recommendations may be updated based on patient status, additional functional criteria and insurance authorization.  Follow Up Recommendations  Skilled nursing-short term rehab (<3 hours/day) Can patient physically be transported by private vehicle: No   Assistance Recommended at Discharge Intermittent Supervision/Assistance  Patient can return home with the following A lot of help with bathing/dressing/bathroom;Assistance with cooking/housework;Assist for transportation;Two people to help with walking and/or transfers;Help with stairs or ramp for entrance   Equipment Recommendations  Other (comment);Hospital bed (Sliding board, drop-arm BSC; needs a wheelchair cushion; would need medical [name] Lei transport for HD)    Recommendations for Other Services       Precautions / Restrictions Precautions Precautions: Fall;Other (comment) Precaution Comments: L BKA (baseline) Restrictions Weight Bearing Restrictions: Yes LLE Weight Bearing: Weight bearing as tolerated Other Position/Activity Restrictions: L distal femur fx; per Dr [name] 8/10 pt can full WB; Wear liner during the day and shrinker overnight     Mobility  Bed Mobility Overal bed mobility: Needs Assistance Bed Mobility: Supine to Sit, Sit to Supine     Supine to sit: HOB elevated, Min assist     General bed mobility comments: Min assist for trunk, cues for LE placement to get to EOB, effortful, use of rail as needed. Min guard returning to bed.    Transfers                        Ambulation/Gait                   Chief Strategy Officer  Modified Rankin (Stroke Patients Only)       Balance     Sitting balance-Leahy Scale: Good                                      Cognition Arousal/Alertness: Awake/alert Behavior During Therapy: WFL for tasks assessed/performed Overall Cognitive Status: No  family/caregiver present to determine baseline cognitive functioning Area of Impairment: Memory                     Memory: Decreased short-term memory         General Comments: noted STM deficits as pt doesnt recall previous conversation had with PT, concerned STM deficits impacting progress as pt doesnt recall agreeing to complete certain tasks        Exercises      General Comments General comments (skin integrity, edema, etc.): with MAX performative efforts pt eventually agreeable to don shrinker sleeve and prosthetic however unablel to fully don prosthetic d/t sweling, pt did allow for shrinker to be left on at end of session. spent extensive time discussing barriers to DC such as needing to work on toilet transfers/hygiene and being MODI from w/c, in the moment pt seems agreeable however per conversation with other therapists this seems to be the trend. continued to encourage pt to agree to work towards OT goals in order to progress to next venue of care for home. potentially starting this goal by implementing timed toileting to facilitate functional independence with ADL transfers and advance OOB tolerance      Pertinent Vitals/Pain Pain Assessment Pain Assessment: Faces Faces Pain Scale: Hurts little more Pain Location: L hip with L lateral lean Pain Descriptors / Indicators: Discomfort, Constant Pain Intervention(s): Limited activity within patient's tolerance, Monitored during session    Home Living                          Prior Function            PT Goals (current goals can now be found in the care plan section) Acute Rehab PT Goals Patient Stated Goal: Wants to be independent; wants to be able to drive herself to/from HD PT Goal Formulation: With patient Time For Goal Achievement: 02/27/22 Potential to Achieve Goals: Fair Progress towards PT goals: Progressing toward goals (very slowly)    Frequency    Min 2X/week      PT Plan  Current plan remains appropriate    Co-evaluation PT/OT/SLP Co-Evaluation/Treatment: Yes Reason for Co-Treatment: To address functional/ADL transfers;Complexity of the patient's impairments (multi-system involvement) PT goals addressed during session: Mobility/safety with mobility OT goals addressed during session: ADL's and self-care      AM-PAC PT "6 Clicks" Mobility   Outcome Measure  Help needed turning from your back to your side while in a flat bed without using bedrails?: None Help needed moving from lying on your back to sitting on the side of a flat bed without using bedrails?: A Little Help needed moving to and from a bed to a chair (including a wheelchair)?: A Little Help needed standing up from a chair using your arms (e.g., wheelchair or bedside chair)?: Total Help needed to walk in hospital room?: Total Help needed climbing 3-5 steps with a railing? : Total 6 Click Score: 13    End of Session   Activity Tolerance: Patient tolerated treatment well Patient  left: in bed;with call bell/phone within reach;with bed alarm set Nurse Communication: Mobility status PT Visit Diagnosis: Other abnormalities of gait and mobility (R26.89);Muscle weakness (generalized) (M62.81);History of falling (Z91.81);Pain Pain - Right/Left: Left Pain - part of body: Leg (and R foot and ankle)     Time: 7972-8206 PT Time Calculation (min) (ACUTE ONLY): 72 min  Charges:  $Therapeutic Activity: 23-37 mins                     Roney Marion, PT  Acute Rehabilitation Services Office [phone]    Colletta [place] 02/18/2022, 5:12 PM

## 2022-02-18 NOTE — Progress Notes (Signed)
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  Orthopedic/ID were consulted and patient was started on broad-spectrum antibiotics.  Patient underwent multiple I&D's by Dr. [name] along with local tissue rearrangement.  Patient was also seen by vascular surgery who did fistula branch ligation.  Patient has completed 6-week course of daptomycin on 10/09/2021.  She has been unable to complete hemodialysis in chair and outpatient hemodialysis unit has been hard to find. She is otherwise medically stable for discharge.       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 Orthopedic surgery consulted. ID consulted. Patient underwent hardware removal on 6/10 (prior to admission) with I&D on 6/28 and 7/1. Infectious disease recommendations for 6 weeks of Daptomycin; patient has completed course. Patient still with pain. Afebrile. No leukocytosis.  History of left intertrochanteric fracture This was previously managed with IM nail placement in 2021. Hardware removed secondary to above.  ESRD on HD Nephrology on board. -HD per nephrology  Anasarca Improved with hemodialysis.  Hyponatremia Management with  hemodialysis.  Hypokalemia Hyperkalemia Management with hemodialysis  Anemia of chronic disease Acute blood loss anemia In setting of CKD in addition to blood loss from wound. Patient has received a total of 2 units of PRBC via transfusion. Hemoglobin stable.  Diabetes mellitus type 2 Diabetes is uncontrolled with hyperglycemia. Most recent hemoglobin A1C of 7.1%. Patient is on insulin glargine and insulin aspart as an outpatient. -Continue Semglee 10 units qHS, Novolog 6 units TID with meals and SSI  Primary hypertension -Continue hydralazine and Coreg  Hyperlipidemia History of CVA -Continue Lipitor  GERD -Continue Pepcid and Protonix  Tobacco use Tobacco cessation discussed during this admission.  DVT prophylaxis: Heparin subq Code Status:   Code Status: Full Code Family Communication: None at bedside Disposition Plan: Discharge to SNF. Medically stable for discharge. Needs to tolerate HD while in a chair.   Consultants:  Orthopedic surgery Infectious disease Nephrology Vascular surgery  Procedures:  Vascular ultrasound duplex dialysis access 09/07/2021 Fistula revision branch ligation per Dr. Unk Lightning 09/19/2021- Left hip debridement, application of wound VAC by Dr. [name] 08/28/2021  Antimicrobials: Daptomycin Cefazolin   Subjective: Mild headache. No other concerns.  Objective: BP (!) 151/55   Pulse (!) 58   Temp 98.4 F (36.9 C) (Oral)   Resp 18   Ht 5\' 10"  (1.778 m)   Wt 98.4 kg   SpO2 100%   BMI 31.13 kg/m   Examination:  General exam: Appears calm and comfortable. Sitting up in bed. Respiratory system: Respiratory effort normal. Central nervous system: Alert and oriented. Psychiatry: Judgement and insight appear normal. Mood & affect appropriate.    Data Reviewed: I have personally reviewed following labs and imaging studies  CBC Lab Results  Component Value Date   WBC 7.1 02/17/2022   RBC 3.76 (L) 02/17/2022   HGB 11.4 (L) 02/17/2022  HCT 33.9 (L) 02/17/2022   MCV 90.2 02/17/2022   MCH 30.3 02/17/2022   PLT 211 02/17/2022   MCHC 33.6 02/17/2022   RDW 13.7 02/17/2022   LYMPHSABS 1.8 02/03/2022   MONOABS 0.9 02/03/2022   EOSABS 0.2 02/03/2022   BASOSABS 0.1 61/84/8592     Last metabolic panel Lab Results  Component Value Date   NA 127 (L) 02/17/2022   K 5.0 02/17/2022   CL 89 (L) 02/17/2022   CO2 23 02/17/2022   BUN 99 (H) 02/17/2022   CREATININE 7.01 (H) 02/17/2022   GLUCOSE 164 (H) 02/17/2022   GFRNONAA 6 (L) 02/17/2022   GFRAA 13 (L) 12/17/2018   CALCIUM 8.7 (L) 02/17/2022   PHOS 6.5 (H) 02/17/2022   PROT 7.6 12/02/2021   ALBUMIN 3.7 02/17/2022   LABGLOB 3.1 10/29/2018   AGRATIO 1.4 10/29/2018   BILITOT 0.6 12/02/2021   ALKPHOS 105 12/02/2021   AST 16 12/02/2021   ALT 15 12/02/2021   ANIONGAP 15 02/17/2022    GFR: Estimated Creatinine Clearance: 11.7 mL/min (A) (by C-G formula based on SCr of 7.01 mg/dL (H)).  No results found for this or any previous visit (from the past 240 hour(s)).    Radiology Studies: No results found.    LOS: 176 days    Cordelia Poche, MD [facility] 02/18/2022, 9:51 AM   If 7PM-7AM, please contact night-coverage [email or web address]

## 2022-02-18 NOTE — Progress Notes (Signed)
[facility] Progress Note   Subjective:   Had mild cramping after HD yesterday but otherwise no concerns. Denies SOB, CP and dizziness. Approached the subject of HD in recliner again. Her previous concern was that transport takes too long. Offered to coordinate with HD unit so we can bring her to and from the unit ourselves and she doesn't have to wait. Still declines, says she has ongoing hip pain.   Objective Vitals:   02/17/22 1247 02/17/22 1436 02/17/22 2131 02/18/22 0838  BP: (!) 127/57 (!) 160/57 (!) 154/53 (!) 151/55  Pulse: 63 62 63 (!) 58  Resp: 11 18 18 18   Temp: 98.2 F (36.8 C) 98.4 F (36.9 C) 98.1 F (36.7 C) 98.4 F (36.9 C)  TempSrc:  Oral Oral Oral  SpO2: 98% 100% 96% 100%  Weight: 94.6 kg  98.4 kg   Height:       Physical Exam General: [name] Heart: RRR, no murmurs, rubs or gallops Lungs: CTA bilaterally without wheezing, rhonchi or rales Abdomen: Soft, non-distended, +BS. + hiccups Extremities: 1+ edema L hip, trace edema RLE Dialysis Access: LUE AVF + bruit  Additional Objective Labs: Basic Metabolic Panel: Recent Labs  Lab 02/12/22 0805 02/14/22 0753 02/16/22 0910 02/17/22 0948  NA 128* 129* 129* 127*  K 5.0 5.0 5.0 5.0  CL 90* 91* 90* 89*  CO2 24 24 26 23   GLUCOSE 156* 174* 150* 164*  BUN 99* 84* 81* 99*  CREATININE 6.11* 5.54* 5.88* 7.01*  CALCIUM 8.8* 9.0 9.3 8.7*  PHOS 6.3* 6.5*  --  6.5*   Liver Function Tests: Recent Labs  Lab 02/12/22 0805 02/14/22 0753 02/17/22 0948  ALBUMIN 3.7 3.6 3.7   No results for input(s): "LIPASE", "AMYLASE" in the last 168 hours. CBC: Recent Labs  Lab 02/12/22 0805 02/17/22 0946  WBC 6.3 7.1  HGB 11.2* 11.4*  HCT 32.8* 33.9*  MCV 88.9 90.2  PLT 199 211   Blood Culture    Component Value Date/Time   SDES TISSUE 08/28/2021 0917   SPECREQUEST LEFT THIGH TISSUE 08/28/2021 0917   CULT  08/28/2021 0917    MODERATE ENTEROCOCCUS FAECIUM VANCOMYCIN RESISTANT  ENTEROCOCCUS ISOLATED NO ANAEROBES ISOLATED Sent to [facility] for further susceptibility testing. Performed at [facility], [place] [address] [place], [place] [address]    REPTSTATUS 09/02/2021 FINAL 08/28/2021 9528    Cardiac Enzymes: No results for input(s): "CKTOTAL", "CKMB", "CKMBINDEX", "TROPONINI" in the last 168 hours. CBG: Recent Labs  Lab 02/17/22 0719 02/17/22 1433 02/17/22 1605 02/17/22 2131 02/18/22 0724  GLUCAP 153* 151* 230* 90 140*   Iron Studies: No results for input(s): "IRON", "TIBC", "TRANSFERRIN", "FERRITIN" in the last 72 hours. @lablastinr3 @ Studies/Results: No results found. Medications:  albumin human      (feeding supplement) PROSource Plus  30 mL Oral BID BM   alosetron  1 mg Oral BID   amitriptyline  50 mg Oral QHS   atorvastatin  40 mg Oral q1800   carvedilol  3.125 mg Oral BID   docusate sodium  200 mg Oral BID   fentaNYL  1 patch Transdermal Q72H   furosemide  80 mg Oral Daily   hydrALAZINE  50 mg Oral TID   insulin aspart  0-9 Units Subcutaneous TID WC   insulin aspart  6 Units Subcutaneous TID WC   insulin glargine-yfgn  10 Units Subcutaneous QHS   methocarbamol  500 mg Oral QID   multivitamin  1 tablet Oral QHS  pantoprazole  40 mg Oral Daily   polyethylene glycol  17 g Oral Daily   sevelamer carbonate  2,400 mg Oral TID WC   sodium zirconium cyclosilicate  10 g Oral Once per day on Sun Tue Thu Sat    Outpatient Dialysis Orders: MWF at [facility], 400/500, EDW 90kg, 2K/2Ca, LUE AVF - heparin 4000 unit IV bolus, no ESA  Assessment/Plan: 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 Completed 6 wk course of IV Daptomycin on 10/12/21. Per Dr. Jess Barters note 12/28/2021 "NO RESTRICTIONS L HIP".  Chronic L hip/residual limb pain: Hx fractures of L femoral neck (where hardware was removed) as well as distal femur Fx. Pain control per primary.  ESRD: Usual MWF schedule.   Clots HD system  without heparin, 4000 unit bolus and 2000 unit mid-run bolus.  Hyperkalemia: Stable on scheduled lokelma 4 days a week. HTN/volume: BP meds have been tapered down. Large fluids gains between HD which has been discussed extensively, Ongoing edema bilateral lower extremity,   Last Na 127 suggesting hypervolemia. Tolerates 4L UF goals.  Anemia of ESRD: Hgb 11.4.  Last tsat 54%. Aranesp on hold. Secondary HPTH: CorrCa ok, no VDRA. Phos 6.5 , continue Renvela as binder. Needs diet compliance  Nutrition: Alb 3.7, continue protein supplements.  Renal diet w/fluid restrictions.   AVF dysfunction (resolved): S/p branch ligation 09/19/2021  Dr. Virl Cagey. AVF working well now. DM2: Insulin per primary Dispo: SNF previously recommended, but she declined. Not home HD candidate.  Dialyzed in recliner on 9/4 but signed off early due to pain, has declined to come in a recliner since then.  Strongly discussed on the importance of increasing her mobility as much as possible while she is in her room in addition to following her ongoing fluid restriction. Also reinforced the importance of being in the chair for HD treatments to improve strength and mobility- declines again today. Does not like to do PT after HD, tried to do HD in the afternoons so she can do PT in the AM but she refuses PT on HD days. Per Renal Navigator notes there are no clinics that will accept patient unless she will have HD in chair. Will continue to encourage use of chair for HD.     Anice Paganini, PA-C 02/18/2022, 10:33 AM  [facility] Pager: [phone]

## 2022-02-18 NOTE — Progress Notes (Signed)
Occupational Therapy Treatment Patient Details Name: [name] MRN: [id number] DOB: [birth year] Today's Date: 02/18/2022   History of present illness 56 y.o. female presented to ED 6/26 from dialysis with increased bloody drainage from L hip wound. 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 s/p hardware removal of left hip with partial resection of tuft tissue and femure that were nonviable on 08/10/21 Discharged home 6/23. underwent L hip debridement and wound vac placement 6/28; +Left intertrochanteric non-union,  Fracture of left distal femur  PMH: hypertension, hyperlipidemia, ESRD on HD MWF, history of left BKA in 2021, depression/anxiety, stroke, tobacco use, T2DM,  insomnia, chronic pain syndrome,   OT comments  Pt seen in conjunction with PT to maximize pts activity tolerance and optimize pt participation. Pt limited by general malaise today with reports of nausea. Pt initially declined any mobilization d/t nausea therefore began session having collaborative educational session with PT on barriers to DC. Education provided that OT efforts seem to need to be focused on toilet transfers, toileting hygiene and w/c mobility to work towards pts ultimate goal of being MODI at home. During conversation pt agreeable to work towards goals but then when asked if pt can demo skills pt declines completing task and states "I've been doing that." All in all, pt would greatly benefit from consistent OT sessions focused on very specific OT goals of toilet transfers, hygiene and OOB tolerance, however fully acknowledge that pts self limiting behavior can make this level of care difficult. Would offer suggestion of therapy team/nursing team to consider timed toileting once transfers progress in an effort to work towards MODI goals and facilitate improved OOB tolerance. At this time Dc plan remains appropriate.    Recommendations for follow up therapy are one component of a  multi-disciplinary discharge planning process, led by the attending physician.  Recommendations may be updated based on patient status, additional functional criteria and insurance authorization.    Follow Up Recommendations  Skilled nursing-short term rehab (<3 hours/day)     Assistance Recommended at Discharge Intermittent Supervision/Assistance  Patient can return home with the following  A lot of help with bathing/dressing/bathroom;Assistance with cooking/housework;Assist for transportation;Help with stairs or ramp for entrance;Two people to help with walking and/or transfers   Equipment Recommendations  Other (comment) (defer to next venue of care)    Recommendations for Other Services      Precautions / Restrictions Precautions Precautions: Fall;Other (comment) Precaution Comments: L BKA (baseline) Restrictions Weight Bearing Restrictions: Yes LLE Weight Bearing: Weight bearing as tolerated Other Position/Activity Restrictions: L distal femur fx; per Dr [name] 8/10 pt can full WB; Wear liner during the day and shrinker overnight       Mobility Bed Mobility Overal bed mobility: Needs Assistance Bed Mobility: Supine to Sit, Sit to Supine     Supine to sit: HOB elevated, Min assist Sit to supine: Min assist, HOB elevated   General bed mobility comments: light MINA  to elevate trunk with use of bed features, MIN A to transition back to supine needing assist to manage LLE    Transfers Overall transfer level: Needs assistance                 General transfer comment: pt declined OOB transfer but did participate in simulated lateral scoot transfer to [facility] >L side with pt making small incremental scoots with therapists providing only CGA, however suspect pt would need at least MOD A +2 efforts to fully scoot OOB to w/c  or recliner with SB     Balance Overall balance assessment: Needs assistance Sitting-balance support: Feet supported Sitting balance-Leahy Scale: Good          Standing balance comment: NT                           ADL either performed or assessed with clinical judgement   ADL Overall ADL's : Needs assistance/impaired                           Toilet Transfer Details (indicate cue type and reason): pt declined toilet transfer however max education provided on toilet transfer methods and the fac that not working towards toilet transfer goal is impeding her progression towards home, pt often states " I know how to do that" but then declines demonstrated task   Toileting - Clothing Manipulation Details (indicate cue type and reason): talked in depth about multiple methods for pericare from [facility], pt seems to be open to options in the moment but when asked to simulate pt declines. pt did demo lateral lean to L side however increased pain when leaning onto L hip, feel pt may benefit from timed toileting program on acute once transfer improves as well as AE for toileting hygiene     Functional mobility during ADLs: Minimal assistance (bed mobility only) General ADL Comments: ADL participation heavily limited by pain, self limiting behaviors and cog    Extremity/Trunk Assessment Upper Extremity Assessment Upper Extremity Assessment: Overall WFL for tasks assessed   Lower Extremity Assessment Lower Extremity Assessment: Defer to PT evaluation   Cervical / Trunk Assessment Cervical / Trunk Assessment: Normal    Vision Baseline Vision/History: 1 Wears glasses Patient Visual Report: No change from baseline     Perception Perception Perception: Not tested   Praxis Praxis Praxis: Not tested    Cognition Arousal/Alertness: Awake/alert Behavior During Therapy: WFL for tasks assessed/performed Overall Cognitive Status: No family/caregiver present to determine baseline cognitive functioning Area of Impairment: Memory                     Memory: Decreased short-term memory         General Comments: noted  STM deficits as pt doesnt recall previous conversation had with PT, concerned STM deficits impacting progress as pt doesnt recall agreeing to complete certain tasks        Exercises      Shoulder Instructions       General Comments with MAX performative efforts pt eventually agreeable to don shrinker sleeve and prosthetic however unablel to fully don prosthetic d/t sweling, pt did allow for shrinker to be left on at end of session. spent extensive time discussing barriers to DC such as needing to work on toilet transfers/hygiene and being MODI from w/c, in the moment pt seems agreeable however per conversation with other therapists this seems to be the trend. continued to encourage pt to agree to work towards OT goals in order to progress to next venue of care for home. potentially starting this goal by implementing timed toileting to facilitate functional independence with ADL transfers and advance OOB tolerance    Pertinent Vitals/ Pain       Pain Assessment Pain Assessment: Faces Faces Pain Scale: Hurts little more Pain Location: L hip with L lateral lean Pain Descriptors / Indicators: Discomfort, Constant Pain Intervention(s): Limited activity within patient's tolerance, Monitored during session, Patient  requesting pain meds-RN notified  Home Living                                          Prior Functioning/Environment              Frequency  Min 2X/week        Progress Toward Goals  OT Goals(current goals can now be found in the care plan section)  Progress towards OT goals: Progressing toward goals (incrementally, at least verbally seems to want to progress)  Acute Rehab OT Goals Patient Stated Goal: go home OT Goal Formulation: With patient Time For Goal Achievement: 02/25/22 Potential to Achieve Goals: [place] Discharge plan remains appropriate;Frequency remains appropriate    Co-evaluation      Reason for Co-Treatment: To address  functional/ADL transfers;Complexity of the patient's impairments (multi-system involvement) PT goals addressed during session: Mobility/safety with mobility OT goals addressed during session: ADL's and self-care      AM-PAC OT "6 Clicks" Daily Activity     Outcome Measure   Help from another person eating meals?: None Help from another person taking care of personal grooming?: A Little Help from another person toileting, which includes using toliet, bedpan, or urinal?: A Lot Help from another person bathing (including washing, rinsing, drying)?: A Lot Help from another person to put on and taking off regular upper body clothing?: A Little Help from another person to put on and taking off regular lower body clothing?: A Lot 6 Click Score: 16    End of Session    OT Visit Diagnosis: Muscle weakness (generalized) (M62.81);Unsteadiness on feet (R26.81);History of falling (Z91.81)   Activity Tolerance Patient tolerated treatment well   Patient Left in bed;with call bell/phone within reach;with bed alarm set   Nurse Communication Mobility status        Time: 5053-9767 OT Time Calculation (min): 81 min  Charges: OT Treatments $Self Care/Home Management : 23-37 mins  Harley Alto., COTA/L Acute Rehabilitation Services [phone]   [name] 02/18/2022, 3:52 PM

## 2022-02-18 NOTE — Progress Notes (Signed)
RN discussed with hemodialysis staff the plan to revisit transporting patient to dialysis in recliner instead of bed. Hemodialysis will call patient's nurse when they are ready for the patient and the patient will be transported to HD in a chair. This plan was presented to the patient who stated that she will not be ready to do this tomorrow. She stated that the longest she has been able to tolerate sitting in recliner for HD is 2 hours due to hip pain. HD session is four hour long. She stated that she will make an attempt on Friday.

## 2022-02-19 DIAGNOSIS — M86252 Subacute osteomyelitis, left femur: Secondary | ICD-10-CM | POA: Diagnosis not present

## 2022-02-19 DIAGNOSIS — N186 End stage renal disease: Secondary | ICD-10-CM | POA: Diagnosis not present

## 2022-02-19 DIAGNOSIS — I10 Essential (primary) hypertension: Secondary | ICD-10-CM | POA: Diagnosis not present

## 2022-02-19 DIAGNOSIS — E1169 Type 2 diabetes mellitus with other specified complication: Secondary | ICD-10-CM | POA: Diagnosis not present

## 2022-02-19 LAB — CBC
HCT: 34.6 % — ABNORMAL LOW (ref 36.0–46.0)
Hemoglobin: 11.3 g/dL — ABNORMAL LOW (ref 12.0–15.0)
MCH: 29.7 pg (ref 26.0–34.0)
MCHC: 32.7 g/dL (ref 30.0–36.0)
MCV: 90.8 fL (ref 80.0–100.0)
Platelets: 194 10*3/uL (ref 150–400)
RBC: 3.81 MIL/uL — ABNORMAL LOW (ref 3.87–5.11)
RDW: 13.5 % (ref 11.5–15.5)
WBC: 6.2 10*3/uL (ref 4.0–10.5)
nRBC: 0 % (ref 0.0–0.2)

## 2022-02-19 LAB — RENAL FUNCTION PANEL
Albumin: 3.6 g/dL (ref 3.5–5.0)
Anion gap: 15 (ref 5–15)
BUN: 80 mg/dL — ABNORMAL HIGH (ref 6–20)
CO2: 25 mmol/L (ref 22–32)
Calcium: 9 mg/dL (ref 8.9–10.3)
Chloride: 87 mmol/L — ABNORMAL LOW (ref 98–111)
Creatinine, Ser: 5.56 mg/dL — ABNORMAL HIGH (ref 0.44–1.00)
GFR, Estimated: 9 mL/min — ABNORMAL LOW (ref >60)
Glucose, Bld: 155 mg/dL — ABNORMAL HIGH (ref 70–99)
Phosphorus: 6.2 mg/dL — ABNORMAL HIGH (ref 2.5–4.6)
Potassium: 5.1 mmol/L (ref 3.5–5.1)
Sodium: 127 mmol/L — ABNORMAL LOW (ref 135–145)

## 2022-02-19 LAB — GLUCOSE, CAPILLARY
Glucose-Capillary: 119 mg/dL — ABNORMAL HIGH (ref 70–99)
Glucose-Capillary: 118 mg/dL — ABNORMAL HIGH (ref 70–99)
Glucose-Capillary: 117 mg/dL — ABNORMAL HIGH (ref 70–99)
Glucose-Capillary: 133 mg/dL — ABNORMAL HIGH (ref 70–99)

## 2022-02-19 MED ORDER — HEPARIN SODIUM (PORCINE) 1000 UNIT/ML DIALYSIS
2000.0000 [IU] | Freq: Once | INTRAMUSCULAR | Status: DC
  Filled 2022-02-19: qty 2

## 2022-02-19 MED ORDER — HEPARIN SODIUM (PORCINE) 1000 UNIT/ML DIALYSIS: 2000.0000 [IU] | Freq: Once | INTRAMUSCULAR | Status: DC

## 2022-02-19 MED ORDER — HEPARIN SODIUM (PORCINE) 1000 UNIT/ML DIALYSIS
4000.0000 [IU] | Freq: Once | INTRAMUSCULAR | Status: DC
  Administered 2022-02-19: 4000 [IU] via INTRAVENOUS_CENTRAL
  Filled 2022-02-19: qty 4

## 2022-02-19 MED ORDER — HEPARIN SODIUM (PORCINE) 1000 UNIT/ML DIALYSIS: 4000.0000 [IU] | Freq: Once | INTRAMUSCULAR | Status: DC

## 2022-02-19 NOTE — Progress Notes (Signed)
Occupational Therapy Treatment Patient Details Name: [name] MRN: [id number] DOB: [birth year] Today's Date: 02/19/2022   History of present illness 56 y.o. female presented to ED 6/26 from dialysis with increased bloody drainage from L hip wound. 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 s/p hardware removal of left hip with partial resection of tuft tissue and femure that were nonviable on 08/10/21 Discharged home 6/23. underwent L hip debridement and wound vac placement 6/28; +Left intertrochanteric non-union,  Fracture of left distal femur  PMH: hypertension, hyperlipidemia, ESRD on HD MWF, history of left BKA in 2021, depression/anxiety, stroke, tobacco use, T2DM,  insomnia, chronic pain syndrome,   OT comments  Session focus on transfer training with pt able to complete 2 Slide board transfers to R and L side with MIN- MOD A +2, total A for board placement. Pt able to make incrementally scoots with only min guard but noted to fatigue quickly needing assist. Continued to discuss ultimate goal of returning home with this OTA recommending that efforts be focused on becoming MODI from w/c level however pt continues to state she wants to be able to stand on her RLE. Gently educated that the fastest way to get pt back home is to turn efforts towards becoming MODI from w/c. Will continue efforts acutely.     Recommendations for follow up therapy are one component of a multi-disciplinary discharge planning process, led by the attending physician.  Recommendations may be updated based on patient status, additional functional criteria and insurance authorization.    Follow Up Recommendations  Skilled nursing-short term rehab (<3 hours/day)     Assistance Recommended at Discharge Intermittent Supervision/Assistance  Patient can return home with the following  A lot of help with bathing/dressing/bathroom;Assistance with cooking/housework;Assist for  transportation;Help with stairs or ramp for entrance;Two people to help with walking and/or transfers   Equipment Recommendations  Other (comment) (defer to next venue of care)    Recommendations for Other Services      Precautions / Restrictions Precautions Precautions: Fall;Other (comment) Precaution Comments: L BKA (baseline) Restrictions Weight Bearing Restrictions: Yes LLE Weight Bearing: Weight bearing as tolerated Other Position/Activity Restrictions: L distal femur fx; per Dr [name] 8/10 pt can full WB; Wear liner during the day and shrinker overnight( pt declined donning liner 12/20 as it "made her skin itchy"       Mobility Bed Mobility   Bed Mobility: Supine to Sit, Sit to Supine     Supine to sit: Min assist, HOB elevated Sit to supine: Min assist, HOB elevated   General bed mobility comments: light MINA  to elevate trunk with use of bed features, MIN A to transition back to supine needing assist to manage LLE    Transfers Overall transfer level: Needs assistance Equipment used: Sliding board Transfers: Bed to chair/wheelchair/BSC            Lateral/Scoot Transfers: With slide board, From elevated surface, Min assist, Mod assist, +2 physical assistance, +2 safety/equipment General transfer comment: pt initially completed SB transfer to L side with MIN A +2 however as pt fatigues pt required more of MODA +2 to scoot over wheel of w/c. transfer also difficult as pt neglected to state that pad was wet therefore difficulty with using pad to scoot to w/c. pt completed additional SB transfer back to bed to R side with MODA  +2 d/t fatigue.pt can make short incremental scoots on her own however pt fatigues quickly with these types of scoots,  total A for placement of board throughout transfer training, pt declined staying up in w/c     Balance Overall balance assessment: Needs assistance Sitting-balance support: Feet supported Sitting balance-Leahy Scale: Good                                      ADL either performed or assessed with clinical judgement   ADL Overall ADL's : Needs assistance/impaired                 Upper Body Dressing : Minimal assistance;Bed level       Toilet Transfer: Minimal assistance;Moderate assistance;+2 for physical assistance;+2 for safety/equipment;Cueing for safety;Cueing for sequencing;Transfer board Toilet Transfer Details (indicate cue type and reason): simualted via SB transfer to w/c to R and L side. pt required inital MIN A +2 but fatigues quickly needing MOD A +2, total A for board placement   Toileting - Clothing Manipulation Details (indicate cue type and reason): discussed hygiene methods with pt reporting she plans to lean to R side to complete hygiene however today pt states she would complete from the bed even though conversation was able hygiene from [place] mobility during ADLs: Minimal assistance;Moderate assistance;+2 for physical assistance;+2 for safety/equipment (SB transfer) General ADL Comments: ADL participation heavily limited by pain, self limiting behaviors and cog    Extremity/Trunk Assessment Upper Extremity Assessment Upper Extremity Assessment: Generalized weakness   Lower Extremity Assessment Lower Extremity Assessment: Defer to PT evaluation   Cervical / Trunk Assessment Cervical / Trunk Assessment: Normal    Vision Baseline Vision/History: 1 Wears glasses Patient Visual Report: No change from baseline     Perception Perception Perception: Within Functional Limits   Praxis Praxis Praxis: Intact    Cognition Arousal/Alertness: Awake/alert Behavior During Therapy: WFL for tasks assessed/performed Overall Cognitive Status: Impaired/Different from baseline Area of Impairment: Memory, Awareness, Problem solving                     Memory: Decreased short-term memory (thought therapy fot her OOB last session)     Awareness:  Intellectual Problem Solving: Slow processing, Decreased initiation, Difficulty sequencing, Requires verbal cues General Comments: pt perseverates on what shes done in the past and has difficulty problem solving barriers in the moment. pt emotional during session feeling overwhelmed by all the goals she has to reach in order to DC. pt would really benefit from skilled mental health counseling to address stress mgmt, coping skills etc        Exercises      Shoulder Instructions       General Comments pt declined donning liner on LLE as pt reports it made her skin dry, pts residual limb was noted to be dry however continues to reiterate importance of protecting joint integrity to promote proper prosthetic fit, daughter present during session    Pertinent Vitals/ Pain       Pain Assessment Pain Assessment: Faces Faces Pain Scale: Hurts little more Pain Location: L hip with mobility Pain Descriptors / Indicators: Discomfort, Grimacing Pain Intervention(s): Limited activity within patient's tolerance, Monitored during session, Repositioned, Premedicated before session  Home Living                                          Prior Functioning/Environment  Frequency  Min 2X/week        Progress Toward Goals  OT Goals(current goals can now be found in the care plan section)  Progress towards OT goals: Progressing toward goals  Acute Rehab OT Goals Patient Stated Goal: go home OT Goal Formulation: With patient/family Time For Goal Achievement: 02/25/22 Potential to Achieve Goals: [place] Discharge plan remains appropriate;Frequency remains appropriate    Co-evaluation                 AM-PAC OT "6 Clicks" Daily Activity     Outcome Measure   Help from another person eating meals?: None Help from another person taking care of personal grooming?: A Little Help from another person toileting, which includes using toliet, bedpan, or  urinal?: A Lot Help from another person bathing (including washing, rinsing, drying)?: A Lot Help from another person to put on and taking off regular upper body clothing?: A Little Help from another person to put on and taking off regular lower body clothing?: A Lot 6 Click Score: 16    End of Session Equipment Utilized During Treatment: Gait belt;Other (comment) (SB; w/c)  OT Visit Diagnosis: Muscle weakness (generalized) (M62.81);Unsteadiness on feet (R26.81);History of falling (Z91.81)   Activity Tolerance Patient tolerated treatment well   Patient Left in bed;with call bell/phone within reach;with family/visitor present   Nurse Communication Mobility status        Time: 7035-0093 OT Time Calculation (min): 37 min  Charges: OT General Charges $OT Visit: 1 Visit OT Treatments $Self Care/Home Management : 23-37 mins  Harley Alto., COTA/L Acute Rehabilitation Services [phone]   [name] 02/19/2022, 3:58 PM

## 2022-02-19 NOTE — Progress Notes (Addendum)
   02/19/22 1035  Vitals  Temp 97.9 F (36.6 C)  Temp Source Oral  BP (!) 176/59  MAP (mmHg) 93  BP Location Right Arm  BP Method Automatic  Patient Position (if appropriate) Lying  Pulse Rate 63  Pulse Rate Source Monitor  ECG Heart Rate 60  Resp 16  Oxygen Therapy  SpO2 99 %  O2 Device Room Air  Pulse Oximetry Type Continuous   Received patient in bed to unit.  Alert and oriented.  Informed consent signed and in chart.   Treatment initiated: 0912 Treatment completed: 1035  Patient tolerated well.  Transported back to the room  Alert, without acute distress.  Hand-off given to patient's nurse.   Access used: AVF Access issues: venous site continuous high pressure alarms on BFR of 250, 3 techs tried to re position needle, found no options to restick the venous site.. MD made aware, pt coming back tomorrow after resting arm for rest of today  Total UF removed: 222ml Medication(s) given: Heparin 4000 units bolus at beginning of tx Post HD VS: see abpve Post HD weight: 98.3kg   [facility]

## 2022-02-19 NOTE — Progress Notes (Signed)
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  Orthopedic/ID were consulted and patient was started on broad-spectrum antibiotics.  Patient underwent multiple I&D's by Dr. [name] along with local tissue rearrangement.  Patient was also seen by vascular surgery who did fistula branch ligation.  Patient has completed 6-week course of daptomycin on 10/09/2021.  She has been unable to complete hemodialysis in chair and outpatient hemodialysis unit has been hard to find. She is otherwise medically stable for discharge.      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 Orthopedic surgery consulted. ID consulted. Patient underwent hardware removal on 6/10 (prior to admission) with I&D on 6/28 and 7/1. Infectious disease recommendations for 6 weeks of Daptomycin; patient has completed course. Stable.  History of left intertrochanteric fracture This was previously managed with IM nail placement in 2021. Hardware removed secondary to above.  ESRD on HD Nephrology on board. -HD per nephrology  Anasarca Improved with hemodialysis.  Hyponatremia Management with hemodialysis.  Hypokalemia Hyperkalemia Management with  hemodialysis  Anemia of chronic disease Acute blood loss anemia In setting of CKD in addition to blood loss from wound. Patient has received a total of 2 units of PRBC via transfusion. Hemoglobin stable.  Diabetes mellitus type 2 Diabetes is uncontrolled with hyperglycemia. Most recent hemoglobin A1C of 7.1%. Patient is on insulin glargine and insulin aspart as an outpatient. -Continue Semglee 10 units qHS, Novolog 6 units TID with meals and SSI  Primary hypertension -Continue hydralazine and Coreg  Hyperlipidemia History of CVA -Continue Lipitor  GERD -Continue Pepcid and Protonix  Tobacco use Tobacco cessation discussed during this admission.  History of left BKA Noted.  DVT prophylaxis: Heparin subq Code Status:   Code Status: Full Code Family Communication: None at bedside Disposition Plan: Discharge to SNF. Medically stable for discharge. Needs to tolerate HD while in a chair.   Consultants:  Orthopedic surgery Infectious disease Nephrology Vascular surgery  Procedures:  Vascular ultrasound duplex dialysis access 09/07/2021 Fistula revision branch ligation per Dr. Unk Lightning 09/19/2021- Left hip debridement, application of wound VAC by Dr. [name] 08/28/2021  Antimicrobials: Daptomycin Cefazolin   Subjective: No specific concerns today. Patient was meant to having dialysis in chair today, but declined.  Objective: BP (!) 180/58 (BP Location: Right Arm)   Pulse 64   Temp 98.3 F (36.8 C) (Oral)   Resp 15   Ht 5\' 10"  (1.778 m)   Wt 98.5 kg   SpO2 99%   BMI 31.16 kg/m   Examination:  General exam: Appears calm and comfortable   Data Reviewed: I have personally reviewed following labs and imaging studies  CBC Lab Results  Component Value Date   WBC 6.2 02/19/2022   RBC 3.81 (L) 02/19/2022   HGB 11.3 (L) 02/19/2022   HCT 34.6 (L) 02/19/2022   MCV 90.8 02/19/2022   MCH 29.7  02/19/2022   PLT 194 02/19/2022   MCHC 32.7 02/19/2022   RDW 13.5  02/19/2022   LYMPHSABS 1.8 02/03/2022   MONOABS 0.9 02/03/2022   EOSABS 0.2 02/03/2022   BASOSABS 0.1 48/03/6551     Last metabolic panel Lab Results  Component Value Date   NA 127 (L) 02/17/2022   K 5.0 02/17/2022   CL 89 (L) 02/17/2022   CO2 23 02/17/2022   BUN 99 (H) 02/17/2022   CREATININE 7.01 (H) 02/17/2022   GLUCOSE 164 (H) 02/17/2022   GFRNONAA 6 (L) 02/17/2022   GFRAA 13 (L) 12/17/2018   CALCIUM 8.7 (L) 02/17/2022   PHOS 6.5 (H) 02/17/2022   PROT 7.6 12/02/2021   ALBUMIN 3.7 02/17/2022   LABGLOB 3.1 10/29/2018   AGRATIO 1.4 10/29/2018   BILITOT 0.6 12/02/2021   ALKPHOS 105 12/02/2021   AST 16 12/02/2021   ALT 15 12/02/2021   ANIONGAP 15 02/17/2022    GFR: Estimated Creatinine Clearance: 11.7 mL/min (A) (by C-G formula based on SCr of 7.01 mg/dL (H)).  No results found for this or any previous visit (from the past 240 hour(s)).    Radiology Studies: No results found.    LOS: 177 days    Cordelia Poche, MD [facility] 02/19/2022, 9:50 AM   If 7PM-7AM, please contact night-coverage [email or web address]

## 2022-02-19 NOTE — Progress Notes (Signed)
This Surveyor, quantity received a call from [employer]. I was informed that the patient had agreed to come to dialysis if transport could be arranged between units so that she wouldn't have to wait in the chair for transport and that she come 1st shift. This leader agreed. 02/19/2022 at 0700 staff began to call 25M to have patient be brought up and patient refused stating she needed to eat. Staff called back 45 minutes later and patient still refusing to come. Patient finally came up at 0900 in the bed and stated that she may sit in the chair on Friday. HD department can not accommodate holding a bay for several hours, due to the department having a full list of patients waiting for treatments and delays cause patients to be bumped into the next day. Provider [place]. Notified.

## 2022-02-19 NOTE — Procedures (Signed)
I was present at this dialysis session. I have reviewed the session itself and made appropriate changes.   Vital signs in last 24 hours:  Temp:  [97.7 F (36.5 C)-98.3 F (36.8 C)] 97.7 F (36.5 C) (12/20 0734) Pulse Rate:  [57-59] 57 (12/20 0734) Resp:  [18] 18 (12/20 0734) BP: (172-190)/(60-63) 172/60 (12/20 0734) SpO2:  [100 %] 100 % (12/20 0734) Weight change:  Filed Weights   02/17/22 0827 02/17/22 1247 02/17/22 2131  Weight: 99.3 kg 94.6 kg 98.4 kg    Recent Labs  Lab 02/17/22 0948  NA 127*  K 5.0  CL 89*  CO2 23  GLUCOSE 164*  BUN 99*  CREATININE 7.01*  CALCIUM 8.7*  PHOS 6.5*    Recent Labs  Lab 02/17/22 0946  WBC 7.1  HGB 11.4*  HCT 33.9*  MCV 90.2  PLT 211    Scheduled Meds:  (feeding supplement) PROSource Plus  30 mL Oral BID BM   alosetron  1 mg Oral BID   amitriptyline  50 mg Oral QHS   atorvastatin  40 mg Oral q1800   carvedilol  3.125 mg Oral BID   Chlorhexidine Gluconate Cloth  6 each Topical Q0600   docusate sodium  200 mg Oral BID   fentaNYL  1 patch Transdermal Q72H   furosemide  80 mg Oral Daily   heparin  2,000 Units Dialysis Once in dialysis   heparin  4,000 Units Dialysis Once in dialysis   hydrALAZINE  50 mg Oral TID   insulin aspart  0-9 Units Subcutaneous TID WC   insulin aspart  6 Units Subcutaneous TID WC   insulin glargine-yfgn  10 Units Subcutaneous QHS   methocarbamol  500 mg Oral QID   multivitamin  1 tablet Oral QHS   pantoprazole  40 mg Oral Daily   polyethylene glycol  17 g Oral Daily   sevelamer carbonate  2,400 mg Oral TID WC   sodium zirconium cyclosilicate  10 g Oral Once per day on Sun Tue Thu Sat   Continuous Infusions:  albumin human     PRN Meds:.acetaminophen, albumin human, ALPRAZolam, bisacodyl, calcium carbonate, camphor-menthol, ondansetron, mouth rinse, oxyCODONE, senna-docusate   Donetta Potts,  MD 02/19/2022, 9:07 AM

## 2022-02-20 DIAGNOSIS — E1169 Type 2 diabetes mellitus with other specified complication: Secondary | ICD-10-CM | POA: Diagnosis not present

## 2022-02-20 DIAGNOSIS — M86252 Subacute osteomyelitis, left femur: Secondary | ICD-10-CM | POA: Diagnosis not present

## 2022-02-20 DIAGNOSIS — F419 Anxiety disorder, unspecified: Secondary | ICD-10-CM | POA: Diagnosis not present

## 2022-02-20 DIAGNOSIS — F32A Depression, unspecified: Secondary | ICD-10-CM | POA: Diagnosis not present

## 2022-02-20 DIAGNOSIS — N186 End stage renal disease: Secondary | ICD-10-CM | POA: Diagnosis not present

## 2022-02-20 DIAGNOSIS — I10 Essential (primary) hypertension: Secondary | ICD-10-CM | POA: Diagnosis not present

## 2022-02-20 LAB — GLUCOSE, CAPILLARY
Glucose-Capillary: 112 mg/dL — ABNORMAL HIGH (ref 70–99)
Glucose-Capillary: 174 mg/dL — ABNORMAL HIGH (ref 70–99)
Glucose-Capillary: 68 mg/dL — ABNORMAL LOW (ref 70–99)
Glucose-Capillary: 178 mg/dL — ABNORMAL HIGH (ref 70–99)

## 2022-02-20 MED ORDER — CHLORHEXIDINE GLUCONATE CLOTH 2 % EX PADS: 6.0000 | MEDICATED_PAD | Freq: Every day | CUTANEOUS | Status: DC

## 2022-02-20 MED ORDER — MIRTAZAPINE 15 MG PO TBDP
7.5000 mg | ORAL_TABLET | Freq: Every day | ORAL | Status: DC
  Administered 2022-02-20 – 2022-03-10 (×19): 7.5 mg via ORAL
  Filled 2022-02-20 (×21): qty 0.5

## 2022-02-20 NOTE — Progress Notes (Signed)
PROGRESS NOTE    [name]  OEV:[id number] DOB: June 04, 1965 DOA: 08/26/2021 PCP: [name], MD   Brief Narrative: This 56 year old female with PMH significant of hypertension, hyperlipidemia, diabetes mellitus type 2, end-stage renal disease on hemodialysis, status post left BKA for chronic calcaneus osteomyelitis on 01/12/2020, bipolar disorder, left distal femoral fracture after a fall on 07/19/2021 deemed  nonoperative, admission from 08/03/2021-08/23/2021 for left femoral osteomyelitis requiring partial resection of femur and rearrangements of bony fragments with subsequent antibiotic treatment presented to the hospital with left hip bleeding and was found to have subacute osteomyelitis and abscess of left femur.  Orthopedic/ID were consulted and patient was started on broad-spectrum antibiotics.  Patient underwent multiple I&D's by Dr. [name] along with local tissue rearrangement.  Patient was also seen by vascular surgery who did fistula branch ligation.  Patient has completed 6-week course of daptomycin on 10/09/2021.  She has been unable to complete hemodialysis in chair and outpatient hemodialysis unit has been hard to find. She is otherwise medically stable for discharge.      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 Orthopedic surgery consulted. ID consulted. Patient underwent hardware removal on 6/10 (prior to admission) with I&D on 6/28 and 7/1. Infectious disease recommendations for 6 weeks of Daptomycin; patient has completed course. Stable.  History of left intertrochanteric fracture This was previously managed with IM nail placement in 2021. Hardware removed secondary to above.  ESRD on HD Nephrology on board.  -HD per nephrology  Anasarca Improved with hemodialysis.  Hyponatremia Management with hemodialysis.  Hypokalemia Hyperkalemia Management with  hemodialysis  Anemia of chronic disease Acute blood loss anemia In setting of CKD in addition to blood loss from wound. Patient has received a total of 2 units of PRBC via transfusion. Hemoglobin stable.  Diabetes mellitus type 2 Diabetes is uncontrolled with hyperglycemia. Most recent hemoglobin A1C of 7.1%. Patient is on insulin glargine and insulin aspart as an outpatient. -Continue Semglee 10 units qHS, Novolog 6 units TID with meals and SSI  Primary hypertension -Continue hydralazine and Coreg  Hyperlipidemia History of CVA -Continue Lipitor  GERD -Continue Pepcid and Protonix  Tobacco use Tobacco cessation discussed during this admission.  History of left BKA Noted.  Debility Patient is not always consistent with HD secondary to subjective reasons. This also affects physical therapy with patient deciding when and how much to participate. Patient voices goals for improvement but inconsistently follows through, and this has contributed to long length of stay. -Psychiatry consulted for possible behavioral health recommendations   DVT prophylaxis: Heparin subq Code Status:   Code Status: Full Code Family Communication: None at bedside Disposition Plan: Discharge to SNF. Medically stable for discharge. Needs to tolerate HD while in a chair.   Consultants:  Orthopedic surgery Infectious disease Nephrology Vascular surgery Psychiatry  Procedures:  Vascular ultrasound duplex dialysis access 09/07/2021 Fistula revision branch ligation per Dr. Unk Lightning 09/19/2021- Left hip debridement, application of wound VAC by Dr. [name] 08/28/2021  Antimicrobials: Daptomycin Cefazolin   Subjective: Headache today with nausea. No reported emesis.  Objective: BP (!) 160/51   Pulse 64   Temp 98.3 F (36.8 C) (Oral)   Resp 16   Ht 5\' 10"  (1.778 m)   Wt 98.3 kg   SpO2 97%   BMI 31.09 kg/m   Examination:  General exam: Appears calm and comfortable. Lying in bed with towel  over her face.  Data Reviewed: I have personally reviewed following labs and imaging studies  CBC Lab Results  Component Value Date   WBC 6.2 02/19/2022   RBC 3.81 (L) 02/19/2022   HGB 11.3 (L) 02/19/2022   HCT 34.6 (L) 02/19/2022   MCV 90.8 02/19/2022   MCH 29.7 02/19/2022   PLT 194 02/19/2022   MCHC 32.7 02/19/2022   RDW 13.5 02/19/2022   LYMPHSABS 1.8 02/03/2022   MONOABS 0.9 02/03/2022   EOSABS 0.2 02/03/2022   BASOSABS 0.1 77/01/6578     Last metabolic panel Lab Results  Component Value Date   NA 127 (L) 02/19/2022   K 5.1 02/19/2022   CL 87 (L) 02/19/2022   CO2 25 02/19/2022   BUN 80 (H) 02/19/2022   CREATININE 5.56 (H) 02/19/2022   GLUCOSE 155 (H) 02/19/2022   GFRNONAA 9 (L) 02/19/2022   GFRAA 13 (L) 12/17/2018   CALCIUM 9.0 02/19/2022   PHOS 6.2 (H) 02/19/2022   PROT 7.6 12/02/2021   ALBUMIN 3.6 02/19/2022   LABGLOB 3.1 10/29/2018   AGRATIO 1.4 10/29/2018   BILITOT 0.6 12/02/2021   ALKPHOS 105 12/02/2021   AST 16 12/02/2021   ALT 15 12/02/2021   ANIONGAP 15 02/19/2022    GFR: Estimated Creatinine Clearance: 14.7 mL/min (A) (by C-G formula based on SCr of 5.56 mg/dL (H)).  No results found for this or any previous visit (from the past 240 hour(s)).    Radiology Studies: No results found.    LOS: 178 days    Cordelia Poche, MD [facility] 02/20/2022, 12:04 PM   If 7PM-7AM, please contact night-coverage [email or web address]

## 2022-02-20 NOTE — Progress Notes (Signed)
PT Cancellation Note  Patient Details Name: [name] MRN: [id number] DOB: 09/30/1965   Cancelled Treatment:    Reason Eval/Treat Not Completed: Patient declined, stating that she is nauseous. Reports she missed dialysis yesterday and is scheduled today. We will make our best efforts to follow-up with [name] tomorrow.  Candie Mile, PT, DPT Physical Therapist [facility]  02/20/2022, 10:02 AM

## 2022-02-20 NOTE — Progress Notes (Signed)
[facility] Progress Note   Subjective:   AVF infiltrated yesterday, only had 1 hour of HD due to issues with access. Plan was to rest AVF and return today but pt now has a migraine and nausea. She does not feel like she can tolerate HD right now. Denies CP, SOB, abdominal pain.   Objective Vitals:   02/19/22 1536 02/19/22 1959 02/20/22 0637 02/20/22 0854  BP: (!) 175/59 (!) 160/67 (!) 178/62 (!) 160/51  Pulse: (!) 58 61 (!) 58 64  Resp: 18 16 16 16   Temp: 98.5 F (36.9 C) 98.1 F (36.7 C) 98.3 F (36.8 C) 98.3 F (36.8 C)  TempSrc: Oral Oral Oral Oral  SpO2: 100% 100% 100% 97%  Weight:      Height:       Physical Exam General: [name] Heart: RRR, no murmurs, rubs or gallops Lungs: CTA bilaterally without wheezing, rhonchi or rales Abdomen: Soft, non-distended, +BS. Extremities: 2+ edema L hip, trace edema RLE Dialysis Access: LUE AVF + bruit, + mild bruising/swelling  Additional Objective Labs: Basic Metabolic Panel: Recent Labs  Lab 02/14/22 0753 02/16/22 0910 02/17/22 0948 02/19/22 0913  NA 129* 129* 127* 127*  K 5.0 5.0 5.0 5.1  CL 91* 90* 89* 87*  CO2 24 26 23 25   GLUCOSE 174* 150* 164* 155*  BUN 84* 81* 99* 80*  CREATININE 5.54* 5.88* 7.01* 5.56*  CALCIUM 9.0 9.3 8.7* 9.0  PHOS 6.5*  --  6.5* 6.2*   Liver Function Tests: Recent Labs  Lab 02/14/22 0753 02/17/22 0948 02/19/22 0913  ALBUMIN 3.6 3.7 3.6   No results for input(s): "LIPASE", "AMYLASE" in the last 168 hours. CBC: Recent Labs  Lab 02/17/22 0946 02/19/22 0912  WBC 7.1 6.2  HGB 11.4* 11.3*  HCT 33.9* 34.6*  MCV 90.2 90.8  PLT 211 194   Blood Culture    Component Value Date/Time   SDES TISSUE 08/28/2021 0917   SPECREQUEST LEFT THIGH TISSUE 08/28/2021 0917   CULT  08/28/2021 0917    MODERATE ENTEROCOCCUS FAECIUM VANCOMYCIN RESISTANT ENTEROCOCCUS ISOLATED NO ANAEROBES ISOLATED Sent to [place] for further susceptibility testing. Performed at [facility], [address] [place], [place] [address]    REPTSTATUS 09/02/2021 FINAL 08/28/2021 0932    Cardiac Enzymes: No results for input(s): "CKTOTAL", "CKMB", "CKMBINDEX", "TROPONINI" in the last 168 hours. CBG: Recent Labs  Lab 02/19/22 1122 02/19/22 1635 02/19/22 2001 02/20/22 0714 02/20/22 1112  GLUCAP 118* 117* 133* 112* 174*   Iron Studies: No results for input(s): "IRON", "TIBC", "TRANSFERRIN", "FERRITIN" in the last 72 hours. @lablastinr3 @ Studies/Results: No results found. Medications:  albumin human      (feeding supplement) PROSource Plus  30 mL Oral BID BM   alosetron  1 mg Oral BID   amitriptyline  50 mg Oral QHS   atorvastatin  40 mg Oral q1800   carvedilol  3.125 mg Oral BID   Chlorhexidine Gluconate Cloth  6 each Topical Q0600   docusate sodium  200 mg Oral BID   fentaNYL  1 patch Transdermal Q72H   furosemide  80 mg Oral Daily   hydrALAZINE  50 mg Oral TID   insulin aspart  0-9 Units Subcutaneous TID WC   insulin aspart  6 Units Subcutaneous TID WC   insulin glargine-yfgn  10 Units Subcutaneous QHS   methocarbamol  500 mg Oral QID   multivitamin  1 tablet Oral QHS   pantoprazole  40 mg Oral Daily  polyethylene glycol  17 g Oral Daily   sevelamer carbonate  2,400 mg Oral TID WC   sodium zirconium cyclosilicate  10 g Oral Once per day on Sun Tue Thu Sat    Outpatient Dialysis Orders: MWF at [place], 400/500, EDW 90kg, 2K/2Ca, LUE AVF - heparin 4000 unit IV bolus, no ESA    Assessment/Plan: 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 Completed 6 wk course of IV Daptomycin on 10/12/21. Per Dr. Jess Barters note 12/28/2021 "NO RESTRICTIONS L HIP".  Chronic L hip/residual limb pain: Hx fractures of L femoral neck (where hardware was removed) as well as distal femur Fx. Pain control per primary.  ESRD: Usual MWF schedule.   Clots HD system without heparin, 4000 unit bolus and 2000 unit mid-run bolus.  Infiltrated yesterday, plan was to attempt HD again today but she has a migraine, advised to limit fluids and potassium, next HD tomorrow Hyperkalemia: Stable on scheduled lokelma 4 days a week. HTN/volume: BP meds have been tapered down. Large fluids gains between HD which has been discussed extensively, Ongoing edema bilateral lower extremity,   Last Na 127 suggesting hypervolemia. Tolerates 4L UF goals.  Anemia of ESRD: Hgb 11.3-stable.  Last tsat 54%. Aranesp on hold. Secondary HPTH: CorrCa ok, no VDRA. Phos 6.2, continue Renvela as binder. Needs diet compliance  Nutrition: Alb 3.7, continue protein supplements.  Renal diet w/fluid restrictions.   AVF dysfunction (resolved): S/p branch ligation 09/19/2021  Dr. Virl Cagey. AVF working well now. DM2: Insulin per primary Dispo: SNF previously recommended, but she declined. Not home HD candidate.  Dialyzed in recliner on 9/4 but signed off early due to pain, has declined to come in a recliner since then.  Strongly discussed on the importance of increasing her mobility as much as possible while she is in her room in addition to following her ongoing fluid restriction. Also reinforced the importance of being in the chair for HD treatments to improve strength and mobility- declines again today. Does not like to do PT after HD, tried to do HD in the afternoons so she can do PT in the AM but she refuses PT on HD days. Per Renal Navigator notes there are no clinics that will accept patient unless she will have HD in chair. Will continue to encourage use of chair for HD.     Anice Paganini, PA-C 02/20/2022, 11:19 AM  [facility] Pager: [phone]

## 2022-02-20 NOTE — Progress Notes (Signed)
Mobility Specialist Progress Note:   02/20/22 1300  Mobility  Activity  (daily stretches + exercises)  Assistive Device None  Range of Motion/Exercises Active;Passive;Left leg  Activity Response Tolerated well  Mobility Referral Yes  $Mobility charge 1 Mobility   Pt agreeable to mobility session despite not feeling well. Performed daily stretches and exercises. Tolerated well, left with all needs met.   Nelta Numbers Mobility Specialist Please contact via SecureChat or  Rehab office at [phone]

## 2022-02-20 NOTE — Consult Note (Signed)
[facility] New Face-to-Face Psychiatric Evaluation   Service Date: February 20, 2022 LOS:  LOS: 178 days    Assessment  [name] is a 56 y.o. female admitted medically for 08/26/2021 12:09 PM for subacute osteomyelitis and abscess of L femur. She carries the psychiatric diagnoses of anxiety and depression and has a past medical history of  ESRD, anemia of CKD, L hip fracture s/p IM nail placement. Psychiatry was consulted for psychological assessment by Dr. Lonny Prude. Notably she has been in the hospital for almost 6 months. The main barrier to discharge is pt's inability to sit in a chair.    Her current presentation of loss of hope and chronic difficulty coping with hospitalizationd is most consistent with demoralization syndrome; she also meets criteria for previously diagnosed mild to moderate depression.  Current outpatient psychotropic medications include elavil and historically she has had a fair to good response to these medications. She was compliant with medications prior to consult as evidenced by 6 month MAR. On initial examination, patient presented as initially hostile, but warmed up throughout eval and eventually engaged in brief supportive psychotherapy. I think she would benefit from therapy as an outpatient. Please see plan below for detailed recommendations.   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 This decision is based on my review of the chart including patient's history and current presentation, interview of the patient, mental status examination, and consideration of suicide risk including evaluating suicidal ideation, plan, intent, suicidal or self-harm behaviors, risk factors, and protective factors. This judgment is based on our ability to directly address suicide risk, implement suicide prevention strategies and develop a safety plan while the patient is in the clinical setting. Please contact our team if there is a concern that risk level has changed.   ## Medications:  -- c elavil 50 mg  -- c mirtazapine 7.5 mg  - if improvement in mood in ~1-2 weeks, would inc mirtazapine and dec elavil -- stop xanax per pt request   ## Medical Decision Making Capacity:  Not formally assessed  ## Further Work-up:  -- TSH (last 2019) - d/w hospitalist - vit D (low 2016, not checked this admission)    -- most recent EKG on 07/2021 had QtC of <500 -- Pertinent labwork reviewed earlier this admission includes: B12 wnl this admission  ## Disposition:  -- per primary  ## Behavioral / Environmental:  -- Staff should set consistent boundaries with patient -- consider enrollment in [facility] program if not already enrolled for more frequent physical therapy ( it appears they are attempting visits 4ish days a week already)  ##Legal Status -- vol   Thank you for this consult request. Recommendations have been communicated to the primary team.  We will continue to follow at this time.   Pearl A Bennie Scaff   New history   Relevant Aspects  of Hospital Course:  Admitted on 08/26/2021 for IV abx in setting of osteomyelitis. Has had long hospital course only briefly summarized here. Most recently the issue has been pt's inability or unwillingness to sit in dialysis chair (which is a prerequisite to outpt dialysis). Has been able to tolerate ~2/4 hours  while sitting. This is complicated by pt's stated goal of dc home (which frankly is not realistic given physical decline over last 6 months). She has given many reasons she is unable to sit for dialysis, but the biggest stated barrier has been pain.   Patient Report:  Pt seen in late afternoon. Initially she was fairly hostile, but responded fairly well to open ended questions and my sitting down. Expressed frustration with long hospitalization, and feeling like PT is being offered on the hospital's time table - she feels her fall is related to not getting PT after a prior discharge. She does want to work with PT and improve but doesn't want to be pushed out of the hospital until she is ready. She mentions several traumas, but quickly changes the subject so these were not delved into. Mostly affirmed frustration and allowed pt to ventilate. It is clear that she is very frustrated by the loss of control and sees sitting/lying for dialysis as a method to exercise some control over her situation.   Consents to mirtazapine for mood, appetite, sleep, nausea after discussion of r/b/se focusing on wt, orthostasis, ss  Depression: She does still enjoy seeing her daughter, but is otherwise anhedonic. She sleeps OK on amitriptyline unless woken up by nursing staff. No guilt, worthlesness. Energy is OK for circumstances. Appetite is generally decreased esp w/ nausea. She is mildly restless. She denies suicidality. Would reach out if this changes.  Brief screens for mania, psychosis (-).   ROS:  Pain, fatigue  Collateral information:  From chart review  Psychiatric History:  Information collected from pt On many antidepressants, none really worked. All thorugh FM never seen psych or therapy  No hx suicidality No inpt psych admissions  Family psych history: unknown   Social History:  Essentially lives in hospital 1 daughter, 55 Has stepgrandchildren; close with them but divorced from that  husband Socially isolated  Tobacco use: prior to hospital   Family History:   The patient's family history includes Diabetes in her father, mother, and sister; Heart attack in her father and mother; Heart disease in her father and mother; Hyperlipidemia in her mother; Hypertension in her mother and sister.  Medical History: Past Medical History:  Diagnosis Date   Anxiety 2002   Chest tightness    Chronic kidney disease    Depression 2001   Diabetes type 1, uncontrolled    at age 47   Diabetic neuropathy ([place])    Essential hypertension 2015   GERD (gastroesophageal reflux disease)    about age of 68   Headache    Nausea and vomiting in adult    Stroke [facility])    Urinary frequency    Yeast vaginitis     Surgical History: Past Surgical History:  Procedure Laterality Date   A/V FISTULAGRAM Left 08/22/2021   Procedure: A/V Fistulagram;  Surgeon: Cherre Robins, MD;  Location: [place] [facility];  Service: Cardiovascular;  Laterality: Left;   ACHILLES TENDON SURGERY Left 03/10/2013   Procedure: LEFT CHOPART AMPUTATION/ LEFT TENDON ACHILLES [place];  Surgeon: Wylene Simmer, MD;  Location: [place];  Service: Orthopedics;  Laterality: Left;   AMPUTATION Left 06/15/2012  Procedure: AMPUTATION DIGIT;  Surgeon: Meredith Pel, MD;  Location: [place];  Service: Orthopedics;  Laterality: Left;  Left great toe revision amputation   AMPUTATION Left 01/12/2020   Procedure: AMPUTATION BELOW KNEE;  Surgeon: Wylene Simmer, MD;  Location: [place];  Service: Orthopedics;  Laterality: Left;   APPLICATION OF WOUND VAC  08/28/2021   Procedure: APPLICATION OF WOUND VAC;  Surgeon: Newt Minion, MD;  Location: [place];  Service: Orthopedics;;   AV FISTULA PLACEMENT Left 01/09/2020   Procedure: LEFT UPPER ARM ARTERIOVENOUS (AV) FISTULA CREATION;  Surgeon: Cherre Robins, MD;  Location: [place];  Service: Vascular;  Laterality: Left;   ENDOMETRIAL ABLATION     HARDWARE REMOVAL Left 08/10/2021   Procedure:  LEFT HIP REMOVAL NAIL;  Surgeon: Newt Minion, MD;  Location: [place];  Service: Orthopedics;  Laterality: Left;   I & D EXTREMITY Left 01/19/2020   Procedure: IRRIGATION AND DEBRIDEMENT Left Hip;  Surgeon: Rod Can, MD;  Location: [place];  Service: Orthopedics;  Laterality: Left;   I & D EXTREMITY Left 08/28/2021   Procedure: LEFT HIP DEBRIDEMENT;  Surgeon: Newt Minion, MD;  Location: [place];  Service: Orthopedics;  Laterality: Left;   I & D EXTREMITY Left 08/31/2021   Procedure: REPEAT DEBRIDEMENT LEFT HIP;  Surgeon: Newt Minion, MD;  Location: [place];  Service: Orthopedics;  Laterality: Left;   INSERTION OF DIALYSIS CATHETER Right 01/09/2020   Procedure: INSERTION OF DIALYSIS CATHETER;  Surgeon: Cherre Robins, MD;  Location: [place];  Service: Vascular;  Laterality: Right;   INTRAMEDULLARY (IM) NAIL INTERTROCHANTERIC Left 12/29/2019   Procedure: INTRAMEDULLARY (IM) NAIL INTERTROCHANTRIC;  Surgeon: Rod Can, MD;  Location: [facility];  Service: Orthopedics;  Laterality: Left;   IR ANGIOGRAM EXTREMITY LEFT  12/17/2018   IR FEM POP ART ATHERECT INC PTA MOD SED  12/17/2018   IR RADIOLOGIST EVAL & MGMT  11/30/2018   IR RADIOLOGIST EVAL & MGMT  12/09/2018   IR RADIOLOGIST EVAL & MGMT  02/08/2019   IR US GUIDE VASC ACCESS RIGHT  12/17/2018   LAPAROSCOPIC CHOLECYSTECTOMY  01/30/2015   [facility]    REVISON OF ARTERIOVENOUS FISTULA Left 09/19/2021   Procedure: LIGATION OF LEFT ARM ARTERIOVENOUS FISTULA;  Surgeon: Broadus John, MD;  Location: [facility];  Service: Vascular;  Laterality: Left;    Medications:   Current Facility-Administered Medications:    (feeding supplement) PROSource Plus liquid 30 mL, 30 mL, Oral, BID BM, Stovall, Kathryn R, PA-C, 30 mL at 02/20/22 1053   acetaminophen (TYLENOL) tablet 325-650 mg, 325-650 mg, Oral, Q6H PRN, Newt Minion, MD, 650 mg at 02/20/22 1017   albumin human 25 % solution 12.5 g, 12.5 g, Intravenous, PRN, Penninger, Ria Comment, PA   alosetron  (LOTRONEX) tablet 1 mg, 1 mg, Oral, BID, Pokhrel, Laxman, MD, 1 mg at 02/20/22 1104   amitriptyline (ELAVIL) tablet 50 mg, 50 mg, Oral, QHS, Pokhrel, Laxman, MD, 50 mg at 02/19/22 2149   atorvastatin (LIPITOR) tablet 40 mg, 40 mg, Oral, q1800, Manuella Ghazi, Pratik D, DO, 40 mg at 02/20/22 1746   bisacodyl (DULCOLAX) suppository 10 mg, 10 mg, Rectal, Daily PRN, Newt Minion, MD   calcium carbonate (TUMS - dosed in mg elemental calcium) chewable tablet 200 mg of elemental calcium, 1 tablet, Oral, Q2H PRN, Shalhoub, Sherryll Burger, MD, 200 mg of elemental calcium at 01/19/22 0731   camphor-menthol (SARNA) lotion, , Topical, PRN, Donne Hazel, MD   carvedilol (COREG)  tablet 3.125 mg, 3.125 mg, Oral, BID, Penninger, Ria Comment, PA, 3.125 mg at 02/20/22 1104   Chlorhexidine Gluconate Cloth 2 % PADS 6 each, 6 each, Topical, Q0600, Janalee Dane, PA-C, 6 each at 02/19/22 0832   [START ON 02/21/2022] Chlorhexidine Gluconate Cloth 2 % PADS 6 each, 6 each, Topical, Q0600, Collins, Samantha G, PA-C   docusate sodium (COLACE) capsule 200 mg, 200 mg, Oral, BID, Lavina Hamman, MD, 100 mg at 02/18/22 1006   fentaNYL (DURAGESIC) 12 MCG/HR 1 patch, 1 patch, Transdermal, Q72H, Pham, Minh Q, RPH-CPP, 1 patch at 02/20/22 1747   furosemide (LASIX) tablet 80 mg, 80 mg, Oral, Daily, Reesa Chew, MD, 80 mg at 02/20/22 1105   hydrALAZINE (APRESOLINE) tablet 50 mg, 50 mg, Oral, TID, Nita Sells, MD, 50 mg at 02/20/22 1745   insulin aspart (novoLOG) injection 0-9 Units, 0-9 Units, Subcutaneous, TID WC, Gonfa, Taye T, MD, 2 Units at 02/20/22 1240   insulin aspart (novoLOG) injection 6 Units, 6 Units, Subcutaneous, TID WC, [place] Donnamarie Poag, DO, 6 Units at 02/20/22 1240   insulin glargine-yfgn (SEMGLEE) injection 10 Units, 10 Units, Subcutaneous, QHS, Alekh, Kshitiz, MD, 10 Units at 02/19/22 2147   methocarbamol (ROBAXIN) tablet 500 mg, 500 mg, Oral, QID, 500 mg at 02/20/22 1746 **OR** [DISCONTINUED] methocarbamol  (ROBAXIN) 500 mg in dextrose 5 % 50 mL IVPB, 500 mg, Intravenous, Q6H PRN, Newt Minion, MD   mirtazapine (REMERON SOL-TAB) disintegrating tablet 7.5 mg, 7.5 mg, Oral, QHS, Shanica Castellanos A   multivitamin (RENA-VIT) tablet 1 tablet, 1 tablet, Oral, QHS, Newt Minion, MD, 1 tablet at 02/19/22 2151   ondansetron (ZOFRAN) tablet 4 mg, 4 mg, Oral, Q4H PRN, Rai, Ripudeep K, MD, 4 mg at 02/20/22 5188   Oral care mouth rinse, 15 mL, Mouth Rinse, PRN, Dessa Phi, DO   oxyCODONE (Oxy IR/ROXICODONE) immediate release tablet 5 mg, 5 mg, Oral, Q4H PRN, Starla Link, Kshitiz, MD, 5 mg at 02/20/22 0805   pantoprazole (PROTONIX) EC tablet 40 mg, 40 mg, Oral, Daily, Arrien, Jimmy Picket, MD, 40 mg at 02/20/22 1104   polyethylene glycol (MIRALAX / GLYCOLAX) packet 17 g, 17 g, Oral, Daily, Lavina Hamman, MD, 17 g at 02/13/22 1022   senna-docusate (Senokot-S) tablet 1 tablet, 1 tablet, Oral, QHS PRN, Amin, Ankit Chirag, MD   sevelamer carbonate (RENVELA) tablet 2,400 mg, 2,400 mg, Oral, TID WC, Collins, Samantha G, PA-C, 2,400 mg at 02/20/22 1320   sodium zirconium cyclosilicate (LOKELMA) packet 10 g, 10 g, Oral, Once per day on Sun Tue Thu Sat, Samtani, Darylene Price, MD, 10 g at 02/20/22 4166  Allergies: Allergies  Allergen Reactions   Trazodone Swelling   Gabapentin Itching   Lyrica [Pregabalin] Itching   Latex Rash   Lidocaine Itching       Objective  Vital signs:  Temp:  [98.1 F (36.7 C)-98.3 F (36.8 C)] 98.3 F (36.8 C) (12/21 1615) Pulse Rate:  [58-64] 60 (12/21 1615) Resp:  [16-19] 19 (12/21 1615) BP: (160-189)/(51-67) 189/61 (12/21 1615) SpO2:  [97 %-100 %] 100 % (12/21 1615)  Psychiatric Specialty Exam:  Presentation  General Appearance: Appropriate for Environment; Fairly Groomed (has done nails)  Eye Contact:Good  Speech:Clear and Coherent  Speech Volume:Normal  Handedness:No data recorded  Mood and Affect  Mood:Irritable (later  euthymic)  Affect:Appropriate   Thought Process  Thought Processes:Coherent; Goal Directed; Linear  Descriptions of Associations:Intact  Orientation:Full (Time, Place and Person)  Thought Content:Logical  History of Schizophrenia/Schizoaffective disorder:No data recorded Duration  of Psychotic Symptoms:No data recorded Hallucinations:Hallucinations: None  Ideas of Reference:None  Suicidal Thoughts:Suicidal Thoughts: No  Homicidal Thoughts:Homicidal Thoughts: No   Sensorium  Memory:Immediate Good; Recent Good; Remote Good  Judgment:Fair  Insight:Fair   Executive Functions  Concentration:Good  Attention Span:Good  Recall:Good  Fund of Knowledge:Good  Language:Good   Psychomotor Activity  Psychomotor Activity:Psychomotor Activity: Normal   Assets  Assets:Social Support   Sleep  Sleep:Sleep: Fair    Physical Exam: Physical Exam HENT:     Head: Normocephalic.  Pulmonary:     Effort: Pulmonary effort is normal.  Skin:    Comments: No rash on visible skin, long manicured nails  Neurological:     Mental Status: She is alert.    Blood pressure (!) 189/61, pulse 60, temperature 98.3 F (36.8 C), temperature source Oral, resp. rate 19, height 5\' 10"  (1.778 m), weight 98.3 kg, SpO2 100 %. Body mass index is 31.09 kg/m.

## 2022-02-21 DIAGNOSIS — N186 End stage renal disease: Secondary | ICD-10-CM | POA: Diagnosis not present

## 2022-02-21 DIAGNOSIS — Z8673 Personal history of transient ischemic attack (TIA), and cerebral infarction without residual deficits: Secondary | ICD-10-CM | POA: Diagnosis not present

## 2022-02-21 DIAGNOSIS — E1169 Type 2 diabetes mellitus with other specified complication: Secondary | ICD-10-CM | POA: Diagnosis not present

## 2022-02-21 DIAGNOSIS — M86252 Subacute osteomyelitis, left femur: Secondary | ICD-10-CM | POA: Diagnosis not present

## 2022-02-21 LAB — BASIC METABOLIC PANEL
Anion gap: 12 (ref 5–15)
BUN: 38 mg/dL — ABNORMAL HIGH (ref 6–20)
CO2: 26 mmol/L (ref 22–32)
Calcium: 8.7 mg/dL — ABNORMAL LOW (ref 8.9–10.3)
Chloride: 94 mmol/L — ABNORMAL LOW (ref 98–111)
Creatinine, Ser: 3.49 mg/dL — ABNORMAL HIGH (ref 0.44–1.00)
GFR, Estimated: 15 mL/min — ABNORMAL LOW (ref >60)
Glucose, Bld: 227 mg/dL — ABNORMAL HIGH (ref 70–99)
Potassium: 3.5 mmol/L (ref 3.5–5.1)
Sodium: 132 mmol/L — ABNORMAL LOW (ref 135–145)

## 2022-02-21 LAB — CBC
HCT: 35.2 % — ABNORMAL LOW (ref 36.0–46.0)
Hemoglobin: 11.7 g/dL — ABNORMAL LOW (ref 12.0–15.0)
MCH: 29.5 pg (ref 26.0–34.0)
MCHC: 33.2 g/dL (ref 30.0–36.0)
MCV: 88.9 fL (ref 80.0–100.0)
Platelets: 213 10*3/uL (ref 150–400)
RBC: 3.96 MIL/uL (ref 3.87–5.11)
RDW: 13.3 % (ref 11.5–15.5)
WBC: 7 10*3/uL (ref 4.0–10.5)
nRBC: 0 % (ref 0.0–0.2)

## 2022-02-21 LAB — VITAMIN D 25 HYDROXY (VIT D DEFICIENCY, FRACTURES): Vit D, 25-Hydroxy: 10.62 ng/mL — ABNORMAL LOW (ref 30–100)

## 2022-02-21 LAB — GLUCOSE, CAPILLARY
Glucose-Capillary: 163 mg/dL — ABNORMAL HIGH (ref 70–99)
Glucose-Capillary: 120 mg/dL — ABNORMAL HIGH (ref 70–99)
Glucose-Capillary: 188 mg/dL — ABNORMAL HIGH (ref 70–99)
Glucose-Capillary: 202 mg/dL — ABNORMAL HIGH (ref 70–99)

## 2022-02-21 LAB — TSH: TSH: 2.36 u[IU]/mL (ref 0.350–4.500)

## 2022-02-21 MED ORDER — HEPARIN SODIUM (PORCINE) 1000 UNIT/ML DIALYSIS: 2000.0000 [IU] | Freq: Once | INTRAMUSCULAR | Status: DC

## 2022-02-21 MED ORDER — PENTAFLUOROPROP-TETRAFLUOROETH EX AERO: 1.0000 | INHALATION_SPRAY | CUTANEOUS | Status: DC | PRN

## 2022-02-21 MED ORDER — HEPARIN SODIUM (PORCINE) 1000 UNIT/ML DIALYSIS
4000.0000 [IU] | Freq: Once | INTRAMUSCULAR | Status: AC
  Administered 2022-02-21: 4000 [IU] via INTRAVENOUS_CENTRAL
  Filled 2022-02-21: qty 4

## 2022-02-21 MED ORDER — HEPARIN SODIUM (PORCINE) 1000 UNIT/ML IJ SOLN
INTRAMUSCULAR | Status: AC
  Filled 2022-02-21: qty 6

## 2022-02-21 NOTE — Progress Notes (Addendum)
[facility] Progress Note   Subjective: Refused HD this AM for numerous reason, refused HD in chair.  Please see RN CN note. She has a keurig coffee pot on her bedside table.  Now refusing to go to HD in chair.   Objective Vitals:   02/20/22 0854 02/20/22 1615 02/20/22 2133 02/21/22 0909  BP: (!) 160/51 (!) 189/61 (!) 158/62 (!) 168/59  Pulse: 64 60 65 (!) 59  Resp: 16 19 18    Temp: 98.3 F (36.8 C) 98.3 F (36.8 C) 98 F (36.7 C) 98.4 F (36.9 C)  TempSrc: Oral Oral Oral Oral  SpO2: 97% 100%  100%  Weight:      Height:       Physical Exam General: chronically ill appearing female in NAD Heart: S1,S2 No M/R/G.  Lungs: CTAB A/P Abdomen: Obese, NABS Extremities: 2+ pitting edema RLE 2+ pitting edema L hip Dialysis Access: R AVF +T/B   Additional Objective Labs: Basic Metabolic Panel: Recent Labs  Lab 02/16/22 0910 02/17/22 0948 02/19/22 0913  NA 129* 127* 127*  K 5.0 5.0 5.1  CL 90* 89* 87*  CO2 26 23 25   GLUCOSE 150* 164* 155*  BUN 81* 99* 80*  CREATININE 5.88* 7.01* 5.56*  CALCIUM 9.3 8.7* 9.0  PHOS  --  6.5* 6.2*   Liver Function Tests: Recent Labs  Lab 02/17/22 0948 02/19/22 0913  ALBUMIN 3.7 3.6   No results for input(s): "LIPASE", "AMYLASE" in the last 168 hours. CBC: Recent Labs  Lab 02/17/22 0946 02/19/22 0912  WBC 7.1 6.2  HGB 11.4* 11.3*  HCT 33.9* 34.6*  MCV 90.2 90.8  PLT 211 194   Blood Culture    Component Value Date/Time   SDES TISSUE 08/28/2021 0917   SPECREQUEST LEFT THIGH TISSUE 08/28/2021 0917   CULT  08/28/2021 0917    MODERATE ENTEROCOCCUS FAECIUM VANCOMYCIN RESISTANT ENTEROCOCCUS ISOLATED NO ANAEROBES ISOLATED Sent to [place] for further susceptibility testing. Performed at [facility], [place] [address] [place], [place] [address]    REPTSTATUS 09/02/2021 FINAL 08/28/2021 5465    Cardiac Enzymes: No results for input(s): "CKTOTAL", "CKMB", "CKMBINDEX", "TROPONINI" in the last 168  hours. CBG: Recent Labs  Lab 02/20/22 0714 02/20/22 1112 02/20/22 1612 02/20/22 2046 02/21/22 0725  GLUCAP 112* 174* 68* 178* 163*   Iron Studies: No results for input(s): "IRON", "TIBC", "TRANSFERRIN", "FERRITIN" in the last 72 hours. @lablastinr3 @ Studies/Results: No results found. Medications:  albumin human      (feeding supplement) PROSource Plus  30 mL Oral BID BM   alosetron  1 mg Oral BID   amitriptyline  50 mg Oral QHS   atorvastatin  40 mg Oral q1800   carvedilol  3.125 mg Oral BID   docusate sodium  200 mg Oral BID   fentaNYL  1 patch Transdermal Q72H   furosemide  80 mg Oral Daily   heparin  2,000 Units Dialysis Once in dialysis   heparin  4,000 Units Dialysis Once in dialysis   heparin sodium (porcine)       hydrALAZINE  50 mg Oral TID   insulin aspart  0-9 Units Subcutaneous TID WC   insulin aspart  6 Units Subcutaneous TID WC   insulin glargine-yfgn  10 Units Subcutaneous QHS   methocarbamol  500 mg Oral QID   mirtazapine  7.5 mg Oral QHS   multivitamin  1 tablet Oral QHS   pantoprazole  40 mg Oral Daily   polyethylene glycol  17 g Oral  Daily   sevelamer carbonate  2,400 mg Oral TID WC   sodium zirconium cyclosilicate  10 g Oral Once per day on Sun Tue Thu Sat     Outpatient Dialysis Orders: MWF at [place], 400/500, EDW 90kg, 2K/2Ca, LUE AVF - heparin 4000 unit IV bolus, no ESA     Assessment/Plan: 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 Completed 6 wk course of IV Daptomycin on 10/12/21. Per Dr. Jess Barters note 12/28/2021 "NO RESTRICTIONS L HIP".  Chronic L hip/residual limb pain: Hx fractures of L femoral neck (where hardware was removed) as well as distal femur Fx. Pain control per primary.  ESRD: Usual MWF schedule.   Clots HD system without heparin, 4000 unit bolus and 2000 unit mid-run bolus. Refused HD earlier today. HD later this PM on schedule Hyperkalemia: Stable on scheduled lokelma 4 days  a week. HTN/volume: BP meds have been tapered down. Large fluids gains between HD which has been discussed extensively, Ongoing edema bilateral lower extremity,   Last Na 127 suggesting hypervolemia. Tolerates 4L UF goals.  Anemia of ESRD: Hgb 11.3-stable.  Last tsat 54%. Aranesp on hold. Secondary HPTH: CorrCa ok, no VDRA. Phos 6.2, continue Renvela as binder. Needs diet compliance  Nutrition: Alb 3.7, continue protein supplements.  Renal diet w/fluid restrictions.   AVF dysfunction (resolved): S/p branch ligation 09/19/2021  Dr. Virl Cagey. AVF working well now. DM2: Insulin per primary Dispo: SNF previously recommended, but she declined. Not home HD candidate.  Dialyzed in recliner on 9/4 but signed off early due to pain, has declined to come in a recliner since then.  Strongly discussed on the importance of increasing her mobility as much as possible while she is in her room in addition to following her ongoing fluid restriction. Also reinforced the importance of being in the chair for HD treatments to improve strength and mobility- declines again today. Does not like to do PT after HD, tried to do HD in the afternoons so she can do PT in the AM but she refuses PT on HD days. Per Renal Navigator notes there are no clinics that will accept patient unless she will have HD in chair. Will continue to encourage use of chair for HD.       Juliette Standre H. Julius Matus NP-C 02/21/2022, 10:30 AM  [employer] [phone]

## 2022-02-21 NOTE — Progress Notes (Addendum)
This Agricultural consultant received notification from [employer] at 0700 that patient had agreed to run her hemodialysis session in the chair on Friday (today).  This RN called hemodialysis for the chair.  Patient had previously asked to be cleaned prior to shift change, so this RN waited for patient to be cleaned by NT and then approached patient to see about her sitting in the chair for HD. This RN along with patients assigned nurse Ulice Dash, Delane Ginger RN,  stated to the patient that the chair was here for her to go to HD in. Patient stated that she "would not do HD in the chair today because she was sick yesterday and did not want to be sitting up in the HD chair and sick", despite the fact that her bed was in the chair position. This RN stated that patient agreed to go in the chair for HD today, patient stated that she did agree "but only if she felt like it, and she does not feel like it, and would not go today in the chair." Patient did however agree to go in the bed, but only after she had her breakfast.  Once food was delivered, she stated that the food order was wrong. This RN informed patient that she may be bumped to another shift because of the wait, and patient stated that she was okay with that. This RN notified HD RN, Cornell Barman what had transpired, and returned the HD chair.  37M Surveyor, quantity notified as well.

## 2022-02-21 NOTE — Plan of Care (Signed)
°  Problem: Fluid Volume: °Goal: Compliance with measures to maintain balanced fluid volume will improve °Outcome: Progressing °  °Problem: Nutritional: °Goal: Ability to make healthy dietary choices will improve °Outcome: Progressing °  °

## 2022-02-21 NOTE — Progress Notes (Signed)
Progress Note   Patient: [name] RSW:[id number] DOB: [birth year] DOA: 08/26/2021     179 DOS: the patient was seen and examined on 02/21/2022   Brief hospital course: This 56 year old female with PMH significant of hypertension, hyperlipidemia, diabetes mellitus type 2, end-stage renal disease on hemodialysis, status post left BKA for chronic calcaneus osteomyelitis on 01/12/2020, bipolar disorder, left distal femoral fracture after a fall on 07/19/2021 deemed  nonoperative, admission from 08/03/2021-08/23/2021 for left femoral osteomyelitis requiring partial resection of femur and rearrangements of bony fragments with subsequent antibiotic treatment presented to the hospital with left hip bleeding and was found to have subacute osteomyelitis and abscess of left femur.  Orthopedic/ID were consulted and patient was started on broad-spectrum antibiotics.  Patient underwent multiple I&D's by Dr. [name] along with local tissue rearrangement.  Patient was also seen by vascular surgery who did fistula branch ligation.  Patient has completed 6-week course of daptomycin on 10/09/2021.  She has been unable to complete hemodialysis in chair and outpatient hemodialysis unit has been hard to find. She is otherwise medically stable for discharge.      Assessment and Plan: * Subacute osteomyelitis of left femur with abscess  Status post surgical intervention by Dr. [name] with multiple left hip debridements.   Left BKA. She has completed a 6-week course of daptomycin on 10/09/2021.    Re-imaging on 12/02/2021 of left knee showed healing fracture with callus.    No left hip restrictions at this time.  Continue fentanyl patch, methocarbamol, and oxycodone.  Patient able to tolerate wheelchair for mobility.   ESRD (end stage renal disease) on dialysis ([place]) Sp fistula revision and branch ligation.  Hyponatremia, hyperphosphatemia, hyperkalemia.   Continue renal replacement therapy M-W-F.  Continue furosemide 80  mg daily.  Sodium zirconium on non HD days.   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 Continue with EPO   Metabolic bone disease, continue with sevelamer.   History of CVA (cerebrovascular accident) Continue statin   Type 2 diabetes mellitus with hyperlipidemia (HCC) A1C of 7.1 in 07/2021 Uncontrolled hyperglycemia.  Continue insulin therapy basal 10 units and pre meal insulin 6 units.    Continue with statin therapy.   Essential hypertension Continue blood pressure control with amlodipine, carvedilol and hydralazine.   Anxiety and depression Clinically stable.   GASTROESOPHAGEAL REFLUX, NO ESOPHAGITIS Continue with pantoprazole 40 mg daily.   TOBACCO DEPENDENCE Encouraged cessation, declines patch   Obesity (BMI 30.0-34.9) Calculated BMI 30.7 consistent with obesity class 1.         Subjective: Patient has been working with pt and ot, continue to have pain on right lower extremity, controlled with analgesics, continue to be v ery weak and deconditioned   Physical Exam: Vitals:   02/20/22 2133 02/21/22 0909 02/21/22 1322 02/21/22 1340  BP:  (!) 168/59 (!) 182/81 (!) 169/60  Pulse: 65 (!) 59 60 62  Resp: 18  (!) 21 17  Temp: 98 F (36.7 C) 98.4 F (36.9 C) 98.1 F (36.7 C)   TempSrc: Oral Oral Oral   SpO2:  100% 98% 98%  Weight:      Height:       Neurology awake and alert ENT with mild pallor Cardiovascular with S1 and S2 present and rhythmic Respiratory with no rales or wheezing Abdomen with no distention Left BKA Data Reviewed:    Family Communication: no family at the bedside   Disposition: Status is: Inpatient Remains inpatient appropriate because:  pending placement   Planned Discharge Destination: Home     Author: Tawni Millers, MD 02/21/2022 2:21 PM  For on call review [email or web address].

## 2022-02-21 NOTE — Progress Notes (Signed)
Received patient in bed to unit.  Alert and oriented.  Informed consent signed and in chart.   Treatment initiated: 1340 Treatment completed: 0981  Patient tolerated well.  Transported back to the room  Alert, without acute distress.  Hand-off given to patient's nurse.   Access used: AVF Access issues: none  Total UF removed: 4L Medication(s) given: None Post HD VS: 151/95,71,13,97.99% Post HD weight: 94.9kg   [facility]

## 2022-02-21 NOTE — Progress Notes (Signed)
PT Cancellation Note  Patient Details Name: KALII CHESMORE MRN: [id number] DOB: 06/13/1965   Cancelled Treatment:    Reason Eval/Treat Not Completed: Patient at procedure or test/unavailable  Events noted from this morning. Attempted to see after refusal of dialysis but has apparently agreed for HD this afternoon. Currently off unit for HD session. Will follow and progress as tolerated.  Candie Mile, PT, DPT Physical Therapist [facility] [place]  02/21/2022, 1:29 PM

## 2022-02-22 DIAGNOSIS — M86252 Subacute osteomyelitis, left femur: Secondary | ICD-10-CM | POA: Diagnosis not present

## 2022-02-22 DIAGNOSIS — Z8673 Personal history of transient ischemic attack (TIA), and cerebral infarction without residual deficits: Secondary | ICD-10-CM | POA: Diagnosis not present

## 2022-02-22 DIAGNOSIS — N186 End stage renal disease: Secondary | ICD-10-CM | POA: Diagnosis not present

## 2022-02-22 DIAGNOSIS — E1169 Type 2 diabetes mellitus with other specified complication: Secondary | ICD-10-CM | POA: Diagnosis not present

## 2022-02-22 LAB — GLUCOSE, CAPILLARY
Glucose-Capillary: 112 mg/dL — ABNORMAL HIGH (ref 70–99)
Glucose-Capillary: 140 mg/dL — ABNORMAL HIGH (ref 70–99)
Glucose-Capillary: 253 mg/dL — ABNORMAL HIGH (ref 70–99)
Glucose-Capillary: 145 mg/dL — ABNORMAL HIGH (ref 70–99)

## 2022-02-22 NOTE — Progress Notes (Signed)
Mobility Specialist Progress Note:   02/22/22 1500  Mobility  Activity  (daily stretches + exercises)  Assistive Device None  Range of Motion/Exercises Active;Passive;Left leg  Activity Response Tolerated well  Mobility Referral Yes  $Mobility charge 1 Mobility   Pt eager for mobility session. Performed daily stretches and exercises. Pt agreeable to ACE wrap LLE for compression, states shrinker is too small.  Nelta Numbers Mobility Specialist Please contact via SecureChat or  Rehab office at [phone]

## 2022-02-22 NOTE — Progress Notes (Signed)
Progress Note   Patient: [name] GEX:[id number] DOB: 01/28/1966 DOA: 08/26/2021     180 DOS: the patient was seen and examined on 02/22/2022   Brief hospital course: This 56 year old female with PMH significant of hypertension, hyperlipidemia, diabetes mellitus type 2, end-stage renal disease on hemodialysis, status post left BKA for chronic calcaneus osteomyelitis on 01/12/2020, bipolar disorder, left distal femoral fracture after a fall on 07/19/2021 deemed  nonoperative, admission from 08/03/2021-08/23/2021 for left femoral osteomyelitis requiring partial resection of femur and rearrangements of bony fragments with subsequent antibiotic treatment presented to the hospital with left hip bleeding and was found to have subacute osteomyelitis and abscess of left femur.  Orthopedic/ID were consulted and patient was started on broad-spectrum antibiotics.  Patient underwent multiple I&D's by Dr. [name] along with local tissue rearrangement.  Patient was also seen by vascular surgery who did fistula branch ligation.  Patient has completed 6-week course of daptomycin on 10/09/2021.  She has been unable to complete hemodialysis in chair and outpatient hemodialysis unit has been hard to find. She is otherwise medically stable for discharge.      Assessment and Plan: * Subacute osteomyelitis of left femur with abscess  Status post surgical intervention by Dr. [name] with multiple left hip debridements.   Left BKA. She has completed a 6-week course of daptomycin on 10/09/2021.    Re-imaging on 12/02/2021 of left knee showed healing fracture with callus.    No left hip restrictions at this time.  Continue fentanyl patch, methocarbamol, and oxycodone.  Patient able to tolerate wheelchair for mobility.   ESRD (end stage renal disease) on dialysis ([place]) Sp fistula revision and branch ligation.  Hyponatremia, hyperphosphatemia, hyperkalemia.   Continue renal replacement therapy M-W-F.  Continue furosemide 80  mg daily.  Sodium zirconium on non HD days.   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 Continue with EPO   Metabolic bone disease, continue with sevelamer.   Patient has not been able to tolerate chair HD due to left hip pain.   History of CVA (cerebrovascular accident) Continue statin   Type 2 diabetes mellitus with hyperlipidemia (HCC) A1C of 7.1 in 07/2021 Uncontrolled hyperglycemia.  Continue insulin therapy basal 10 units and pre meal insulin 6 units.    Continue with statin therapy.   Essential hypertension Continue blood pressure control with amlodipine, carvedilol and hydralazine.   Anxiety and depression Clinically stable.   GASTROESOPHAGEAL REFLUX, NO ESOPHAGITIS Continue with pantoprazole 40 mg daily.   TOBACCO DEPENDENCE Encouraged cessation, declines patch   Obesity (BMI 30.0-34.9) Calculated BMI 30.7 consistent with obesity class 1.         Subjective: Patient continue to have pain on left hip, not able to tolerate prolong seating for chair HD. No chest pain or dyspnea.   Physical Exam: Vitals:   02/21/22 1759 02/21/22 1851 02/21/22 2051 02/22/22 0912  BP: (!) 151/95 (!) 159/63 (!) 151/59 (!) 166/94  Pulse:  73 76 66  Resp:   16   Temp:  98.4 F (36.9 C) 98.8 F (37.1 C) 98.3 F (36.8 C)  TempSrc:  Oral Oral Oral  SpO2:  100% 97% 100%  Weight: 94.9 kg     Height:       Neurology awake and alert ENT with no pallor Cardiovascular with S1 and S2 present and rhythmic Respiratory with no rales or wheezing Abdomen with no distention  No lower extremity edema  Data Reviewed:    Family Communication:  no family at the bedside   Disposition: Status is: Inpatient Remains inpatient appropriate because: pending placement   Planned Discharge Destination:  to be determined       Author: Tawni Millers, MD 02/22/2022 1:01 PM  For on call review [email or web address].

## 2022-02-22 NOTE — Progress Notes (Signed)
[facility] Progress Note   Subjective: HD 02/21/2022 Net UF 4 liters. No issues reported. HD tomorrow on Holiday schedule.   Objective Vitals:   02/21/22 1759 02/21/22 1851 02/21/22 2051 02/22/22 0912  BP: (!) 151/95 (!) 159/63 (!) 151/59 (!) 166/94  Pulse:  73 76 66  Resp:   16   Temp:  98.4 F (36.9 C) 98.8 F (37.1 C) 98.3 F (36.8 C)  TempSrc:  Oral Oral Oral  SpO2:  100% 97% 100%  Weight: 94.9 kg     Height:       Physical Exam General: chronically ill appearing female in NAD Heart: S1,S2 No M/R/G.  Lungs: CTAB A/P Abdomen: Obese, NABS Extremities: 2+ pitting edema RLE 2+ pitting edema L hip Dialysis Access: R AVF +T/B  Additional Objective Labs: Basic Metabolic Panel: Recent Labs  Lab 02/17/22 0948 02/19/22 0913 02/21/22 2003  NA 127* 127* 132*  K 5.0 5.1 3.5  CL 89* 87* 94*  CO2 23 25 26   GLUCOSE 164* 155* 227*  BUN 99* 80* 38*  CREATININE 7.01* 5.56* 3.49*  CALCIUM 8.7* 9.0 8.7*  PHOS 6.5* 6.2*  --    Liver Function Tests: Recent Labs  Lab 02/17/22 0948 02/19/22 0913  ALBUMIN 3.7 3.6   No results for input(s): "LIPASE", "AMYLASE" in the last 168 hours. CBC: Recent Labs  Lab 02/17/22 0946 02/19/22 0912 02/21/22 2003  WBC 7.1 6.2 7.0  HGB 11.4* 11.3* 11.7*  HCT 33.9* 34.6* 35.2*  MCV 90.2 90.8 88.9  PLT 211 194 213   Blood Culture    Component Value Date/Time   SDES TISSUE 08/28/2021 0917   SPECREQUEST LEFT THIGH TISSUE 08/28/2021 0917   CULT  08/28/2021 0917    MODERATE ENTEROCOCCUS FAECIUM VANCOMYCIN RESISTANT ENTEROCOCCUS ISOLATED NO ANAEROBES ISOLATED Sent to [facility] for further susceptibility testing. Performed at [facility], [place] [address], [place], [place] [address]    REPTSTATUS 09/02/2021 FINAL 08/28/2021 6144    Cardiac Enzymes: No results for input(s): "CKTOTAL", "CKMB", "CKMBINDEX", "TROPONINI" in the last 168 hours. CBG: Recent Labs  Lab 02/21/22 1155 02/21/22 1826 02/21/22 2051  02/22/22 0740 02/22/22 1122  GLUCAP 120* 188* 202* 112* 140*   Iron Studies: No results for input(s): "IRON", "TIBC", "TRANSFERRIN", "FERRITIN" in the last 72 hours. @lablastinr3 @ Studies/Results: No results found. Medications:  albumin human      (feeding supplement) PROSource Plus  30 mL Oral BID BM   alosetron  1 mg Oral BID   amitriptyline  50 mg Oral QHS   atorvastatin  40 mg Oral q1800   carvedilol  3.125 mg Oral BID   docusate sodium  200 mg Oral BID   fentaNYL  1 patch Transdermal Q72H   furosemide  80 mg Oral Daily   hydrALAZINE  50 mg Oral TID   insulin aspart  0-9 Units Subcutaneous TID WC   insulin aspart  6 Units Subcutaneous TID WC   insulin glargine-yfgn  10 Units Subcutaneous QHS   methocarbamol  500 mg Oral QID   mirtazapine  7.5 mg Oral QHS   multivitamin  1 tablet Oral QHS   pantoprazole  40 mg Oral Daily   polyethylene glycol  17 g Oral Daily   sevelamer carbonate  2,400 mg Oral TID WC   sodium zirconium cyclosilicate  10 g Oral Once per day on Sun Tue Thu Sat     Outpatient Dialysis Orders: MWF at [facility], 400/500, EDW 90kg, 2K/2Ca, LUE AVF - heparin  4000 unit IV bolus, no ESA     Assessment/Plan: 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 Completed 6 wk course of IV Daptomycin on 10/12/21. Per Dr. Jess Barters note 12/28/2021 "NO RESTRICTIONS L HIP".  Chronic L hip/residual limb pain: Hx fractures of L femoral neck (where hardware was removed) as well as distal femur Fx. Pain control per primary.  ESRD: Usual MWF schedule.   Clots HD system without heparin, 4000 unit bolus and 2000 unit mid-run bolus.   Hyperkalemia: Stable on scheduled lokelma 4 days a week. HTN/volume: BP meds have been tapered down. Large fluids gains between HD which has been discussed extensively, Ongoing edema bilateral lower extremity,   Last Na 127 suggesting hypervolemia. Tolerates 4L UF goals.  Anemia of ESRD: Hgb 11.3-stable.   Last tsat 54%. Aranesp on hold. Secondary HPTH: CorrCa ok, no VDRA. Phos 6.2, continue Renvela as binder. Needs diet compliance  Nutrition: Alb 3.7, continue protein supplements.  Renal diet w/fluid restrictions.   AVF dysfunction (resolved): S/p branch ligation 09/19/2021  Dr. Virl Cagey. AVF working well now. DM2: Insulin per primary Dispo: SNF previously recommended, but she declined. Not home HD candidate.  Dialyzed in recliner on 9/4 but signed off early due to pain, has declined to come in a recliner since then.  Strongly discussed on the importance of increasing her mobility as much as possible while she is in her room in addition to following her ongoing fluid restriction. Also reinforced the importance of being in the chair for HD treatments to improve strength and mobility- declines again today. Does not like to do PT after HD, tried to do HD in the afternoons so she can do PT in the AM but she refuses PT on HD days. Per Renal Navigator notes there are no clinics that will accept patient unless she will have HD in chair. Will continue to encourage use of chair for HD. Ultimately patient is making her own poor choices that is prolonging hospitalization.   Yasiel Goyne H. Adison Reifsteck NP-C 02/22/2022, 11:25 AM  [employer] [phone]

## 2022-02-23 DIAGNOSIS — Z8673 Personal history of transient ischemic attack (TIA), and cerebral infarction without residual deficits: Secondary | ICD-10-CM | POA: Diagnosis not present

## 2022-02-23 DIAGNOSIS — E1169 Type 2 diabetes mellitus with other specified complication: Secondary | ICD-10-CM | POA: Diagnosis not present

## 2022-02-23 DIAGNOSIS — N186 End stage renal disease: Secondary | ICD-10-CM | POA: Diagnosis not present

## 2022-02-23 DIAGNOSIS — M86252 Subacute osteomyelitis, left femur: Secondary | ICD-10-CM | POA: Diagnosis not present

## 2022-02-23 LAB — GLUCOSE, CAPILLARY
Glucose-Capillary: 164 mg/dL — ABNORMAL HIGH (ref 70–99)
Glucose-Capillary: 194 mg/dL — ABNORMAL HIGH (ref 70–99)
Glucose-Capillary: 172 mg/dL — ABNORMAL HIGH (ref 70–99)

## 2022-02-23 MED ORDER — OXYCODONE HCL 5 MG PO TABS
5.0000 mg | ORAL_TABLET | Freq: Three times a day (TID) | ORAL | Status: DC | PRN
  Administered 2022-02-23 – 2022-03-17 (×37): 5 mg via ORAL
  Filled 2022-02-23 (×39): qty 1

## 2022-02-23 MED ORDER — HEPARIN SODIUM (PORCINE) 1000 UNIT/ML IJ SOLN
INTRAMUSCULAR | Status: AC
  Administered 2022-02-23: 4000 [IU]
  Filled 2022-02-23: qty 4

## 2022-02-23 NOTE — Procedures (Signed)
I was present at this dialysis session. I have reviewed the session itself and made appropriate changes.   Vital signs in last 24 hours:  Temp:  [98.2 F (36.8 C)-98.7 F (37.1 C)] 98.7 F (37.1 C) (12/23 2127) Pulse Rate:  [66-72] 72 (12/23 2127) Resp:  [18] 18 (12/23 2127) BP: (150-166)/(63-94) 150/68 (12/23 2127) SpO2:  [98 %-100 %] 98 % (12/23 2127) Weight:  [94.9 kg] 94.9 kg (12/23 2127) Weight change:  Filed Weights   02/19/22 0844 02/19/22 1035 02/22/22 2127  Weight: 98.5 kg 98.3 kg 94.9 kg    Recent Labs  Lab 02/19/22 0913 02/21/22 2003  NA 127* 132*  K 5.1 3.5  CL 87* 94*  CO2 25 26  GLUCOSE 155* 227*  BUN 80* 38*  CREATININE 5.56* 3.49*  CALCIUM 9.0 8.7*  PHOS 6.2*  --     Recent Labs  Lab 02/17/22 0946 02/19/22 0912 02/21/22 2003  WBC 7.1 6.2 7.0  HGB 11.4* 11.3* 11.7*  HCT 33.9* 34.6* 35.2*  MCV 90.2 90.8 88.9  PLT 211 194 213    Scheduled Meds:  (feeding supplement) PROSource Plus  30 mL Oral BID BM   alosetron  1 mg Oral BID   amitriptyline  50 mg Oral QHS   atorvastatin  40 mg Oral q1800   carvedilol  3.125 mg Oral BID   docusate sodium  200 mg Oral BID   fentaNYL  1 patch Transdermal Q72H   furosemide  80 mg Oral Daily   heparin sodium (porcine)       hydrALAZINE  50 mg Oral TID   insulin aspart  0-9 Units Subcutaneous TID WC   insulin aspart  6 Units Subcutaneous TID WC   insulin glargine-yfgn  10 Units Subcutaneous QHS   methocarbamol  500 mg Oral QID   mirtazapine  7.5 mg Oral QHS   multivitamin  1 tablet Oral QHS   pantoprazole  40 mg Oral Daily   polyethylene glycol  17 g Oral Daily   sevelamer carbonate  2,400 mg Oral TID WC   sodium zirconium cyclosilicate  10 g Oral Once per day on Sun Tue Thu Sat   Continuous Infusions:  albumin human     PRN Meds:.acetaminophen, albumin human, bisacodyl, calcium carbonate, camphor-menthol, heparin sodium (porcine), ondansetron, mouth rinse, oxyCODONE, senna-docusate   Donetta Potts,  MD 02/23/2022, 8:47 AM

## 2022-02-23 NOTE — Progress Notes (Signed)
Progress Note   Patient: [name] KDT:[id number] DOB: December 10, 1965 DOA: 08/26/2021     181 DOS: the patient was seen and examined on 02/23/2022   Brief hospital course: This 56 year old female with PMH significant of hypertension, hyperlipidemia, diabetes mellitus type 2, end-stage renal disease on hemodialysis, status post left BKA for chronic calcaneus osteomyelitis on 01/12/2020, bipolar disorder, left distal femoral fracture after a fall on 07/19/2021 deemed  nonoperative, admission from 08/03/2021-08/23/2021 for left femoral osteomyelitis requiring partial resection of femur and rearrangements of bony fragments with subsequent antibiotic treatment presented to the hospital with left hip bleeding and was found to have subacute osteomyelitis and abscess of left femur.  Orthopedic/ID were consulted and patient was started on broad-spectrum antibiotics.  Patient underwent multiple I&D's by Dr. [name] along with local tissue rearrangement.  Patient was also seen by vascular surgery who did fistula branch ligation.  Patient has completed 6-week course of daptomycin on 10/09/2021.  She has been unable to complete hemodialysis in chair and outpatient hemodialysis unit has been hard to find. She is otherwise medically stable for discharge.      Assessment and Plan: * Subacute osteomyelitis of left femur with abscess  Status post surgical intervention by Dr. [name] with multiple left hip debridements.   Left BKA. She has completed a 6-week course of daptomycin on 10/09/2021.    Re-imaging on 12/02/2021 of left knee showed healing fracture with callus.    No left hip restrictions at this time.  Continue fentanyl patch, methocarbamol, and oxycodone.  Patient able to tolerate wheelchair for mobility.   ESRD (end stage renal disease) on dialysis ([place]) Sp fistula revision and branch ligation.  Hyponatremia, hyperphosphatemia, hyperkalemia.   Continue renal replacement therapy M-W-F.  Continue furosemide 80  mg daily.  Sodium zirconium on non HD days.   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 Continue with EPO   Metabolic bone disease, continue with sevelamer.   Patient has not been able to tolerate chair HD due to left hip pain.   History of CVA (cerebrovascular accident) Continue statin   Type 2 diabetes mellitus with hyperlipidemia (HCC) A1C of 7.1 in 07/2021 Uncontrolled hyperglycemia.  Continue insulin therapy basal 10 units and pre meal insulin 6 units.    Continue with statin therapy.   Essential hypertension Continue blood pressure control with amlodipine, carvedilol and hydralazine.   Anxiety and depression Clinically stable.   GASTROESOPHAGEAL REFLUX, NO ESOPHAGITIS Continue with pantoprazole 40 mg daily.   TOBACCO DEPENDENCE Encouraged cessation, declines patch   Obesity (BMI 30.0-34.9) Calculated BMI 30.7 consistent with obesity class 1.         Subjective: :Patient today with nausea, no chest pain or dyspnea. Not able to seat for HD and continue to have HD in bed.   Physical Exam: Vitals:   02/22/22 2127 02/23/22 0900 02/23/22 0930 02/23/22 1000  BP: (!) 150/68 (!) 151/56 (!) 177/52 (!) 170/57  Pulse: 72 65 72   Resp: 18 15 15    Temp: 98.7 F (37.1 C)     TempSrc: Oral     SpO2: 98% 97% 95%   Weight: 94.9 kg     Height:       Neurology awake and alert ENT with no pallor Cardiovascular with S1 and S2 present and rhythmic Respiratory with no rales  Abdomen not distended Left BKA  Data Reviewed:    Family Communication: no family at the bedside   Disposition: Status is: Inpatient  Remains inpatient appropriate because: pending placement, needs to seat for chair HD   Planned Discharge Destination: Home    Author: Tawni Millers, MD 02/23/2022 10:58 AM  For on call review [email or web address].

## 2022-02-23 NOTE — Progress Notes (Signed)
Received patient in bed to unit.  Alert and oriented.  Informed consent signed and in chart.   Treatment initiated: 0900 Treatment completed: 9494  Pt opted to end tx early, refused to sign tx termination form..  Transported back to the room  Alert, without acute distress.  Hand-off given to patient's nurse.   Access used: AVF Access issues: Arterial pressure alarm.  Total UF removed: 1.1L Medication(s) given: Zofrtan  Post HD VS: 161/61,74,98%,16 Post HD weight: 98.8kg   [facility]

## 2022-02-23 NOTE — Progress Notes (Signed)
Mobility Specialist Progress Note:   02/23/22 1500  Mobility  Activity  (daily stretches + exercises)  Assistive Device None  Range of Motion/Exercises Active;Passive;Left leg  Activity Response Tolerated well  Mobility Referral Yes  $Mobility charge 1 Mobility   Pt performed daily stretches and exercises. Tolerated well. Left with all needs met.   Nelta Numbers Mobility Specialist Please contact via SecureChat or  Rehab office at [phone]

## 2022-02-24 DIAGNOSIS — E1169 Type 2 diabetes mellitus with other specified complication: Secondary | ICD-10-CM | POA: Diagnosis not present

## 2022-02-24 DIAGNOSIS — N186 End stage renal disease: Secondary | ICD-10-CM | POA: Diagnosis not present

## 2022-02-24 DIAGNOSIS — Z992 Dependence on renal dialysis: Secondary | ICD-10-CM | POA: Diagnosis not present

## 2022-02-24 DIAGNOSIS — M86252 Subacute osteomyelitis, left femur: Secondary | ICD-10-CM | POA: Diagnosis not present

## 2022-02-24 LAB — GLUCOSE, CAPILLARY
Glucose-Capillary: 120 mg/dL — ABNORMAL HIGH (ref 70–99)
Glucose-Capillary: 135 mg/dL — ABNORMAL HIGH (ref 70–99)
Glucose-Capillary: 175 mg/dL — ABNORMAL HIGH (ref 70–99)
Glucose-Capillary: 157 mg/dL — ABNORMAL HIGH (ref 70–99)

## 2022-02-24 NOTE — Progress Notes (Signed)
Progress Note   Patient: [name] DOB: 11-05-1965 DOA: 08/26/2021     182 DOS: the patient was seen and examined on 02/24/2022   Brief hospital course: This 56 year old female with PMH significant of hypertension, hyperlipidemia, diabetes mellitus type 2, end-stage renal disease on hemodialysis, status post left BKA for chronic calcaneus osteomyelitis on 01/12/2020, bipolar disorder, left distal femoral fracture after a fall on 07/19/2021 deemed  nonoperative, admission from 08/03/2021-08/23/2021 for left femoral osteomyelitis requiring partial resection of femur and rearrangements of bony fragments with subsequent antibiotic treatment presented to the hospital with left hip bleeding and was found to have subacute osteomyelitis and abscess of left femur.  Orthopedic/ID were consulted and patient was started on broad-spectrum antibiotics.  Patient underwent multiple I&D's by Dr. [name] along with local tissue rearrangement.  Patient was also seen by vascular surgery who did fistula branch ligation.  Patient has completed 6-week course of daptomycin on 10/09/2021.  She has been unable to complete hemodialysis in chair and outpatient hemodialysis unit has been hard to find. She is otherwise medically stable for discharge.      Assessment and Plan: * Subacute osteomyelitis of left femur with abscess  Status post surgical intervention by Dr. [name] with multiple left hip debridements.   Left BKA. She has completed a 6-week course of daptomycin on 10/09/2021.    Re-imaging on 12/02/2021 of left knee showed healing fracture with callus.    No left hip restrictions at this time.  Continue fentanyl patch, methocarbamol, and oxycodone (decrease frequency to q 8 hrs prn).  Patient able to tolerate wheelchair for mobility but not for HD.   ESRD (end stage renal disease) on dialysis ([place]) Sp fistula revision and branch ligation.  Hyponatremia, hyperphosphatemia, hyperkalemia.   Continue renal  replacement therapy M-W-F.  Continue furosemide 80 mg daily.  Sodium zirconium on non HD days.   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 Continue with EPO   Metabolic bone disease, continue with sevelamer.   Patient has not been able to tolerate chair HD due to left hip pain.   History of CVA (cerebrovascular accident) Continue statin   Type 2 diabetes mellitus with hyperlipidemia (HCC) A1C of 7.1 in 07/2021 Uncontrolled hyperglycemia.  Continue insulin therapy basal 10 units and pre meal insulin 6 units.    Continue with statin therapy.   Essential hypertension Continue blood pressure control with amlodipine, carvedilol and hydralazine.   Anxiety and depression Clinically stable.   GASTROESOPHAGEAL REFLUX, NO ESOPHAGITIS Continue with pantoprazole 40 mg daily.   TOBACCO DEPENDENCE Encouraged cessation, declines patch   Obesity (BMI 30.0-34.9) Calculated BMI 30.7 consistent with obesity class 1.         Subjective: Patient reports having pain in her right leg, her family is at the bedside   Physical Exam: Vitals:   02/23/22 1210 02/23/22 1613 02/23/22 2116 02/24/22 0939  BP: (!) 176/60 (!) 155/58 (!) 182/68 (!) 171/64  Pulse: 64 66 70 70  Resp:   18 18  Temp: 98.2 F (36.8 C) 98.2 F (36.8 C) 98.4 F (36.9 C) 98.6 F (37 C)  TempSrc: Oral Oral Oral Oral  SpO2: 99% 99% 97% 100%  Weight:      Height:       Neurology awake and alert ENT with no pallor Cardiovascular with S1 and S2 present  Respiratory with no wheezing Abdomen not distended Left BKA  Data Reviewed:    Family Communication: her family  is at the bedside   Disposition: Status is: Inpatient Remains inpatient appropriate because: pending placement, needs to tolerate chair hemodialysis   Planned Discharge Destination: Home     Author: Tawni Millers, MD 02/24/2022 2:06 PM  For on call review [email or web address].

## 2022-02-24 NOTE — Plan of Care (Signed)
  Problem: Pain Managment: Goal: General experience of comfort will improve Outcome: Progressing   

## 2022-02-25 DIAGNOSIS — M86252 Subacute osteomyelitis, left femur: Secondary | ICD-10-CM | POA: Diagnosis not present

## 2022-02-25 DIAGNOSIS — E1169 Type 2 diabetes mellitus with other specified complication: Secondary | ICD-10-CM | POA: Diagnosis not present

## 2022-02-25 DIAGNOSIS — Z89512 Acquired absence of left leg below knee: Secondary | ICD-10-CM

## 2022-02-25 DIAGNOSIS — Z8673 Personal history of transient ischemic attack (TIA), and cerebral infarction without residual deficits: Secondary | ICD-10-CM | POA: Diagnosis not present

## 2022-02-25 DIAGNOSIS — I10 Essential (primary) hypertension: Secondary | ICD-10-CM | POA: Diagnosis not present

## 2022-02-25 LAB — GLUCOSE, CAPILLARY
Glucose-Capillary: 114 mg/dL — ABNORMAL HIGH (ref 70–99)
Glucose-Capillary: 145 mg/dL — ABNORMAL HIGH (ref 70–99)
Glucose-Capillary: 196 mg/dL — ABNORMAL HIGH (ref 70–99)
Glucose-Capillary: 208 mg/dL — ABNORMAL HIGH (ref 70–99)

## 2022-02-25 MED ORDER — CALCITRIOL 0.25 MCG PO CAPS
0.2500 ug | ORAL_CAPSULE | Freq: Every day | ORAL | Status: DC
  Administered 2022-02-25 – 2022-03-18 (×21): 0.25 ug via ORAL
  Filled 2022-02-25 (×21): qty 1

## 2022-02-25 MED ORDER — CARVEDILOL 6.25 MG PO TABS
6.2500 mg | ORAL_TABLET | Freq: Two times a day (BID) | ORAL | Status: DC
  Administered 2022-02-25 – 2022-03-18 (×36): 6.25 mg via ORAL
  Filled 2022-02-25 (×36): qty 1

## 2022-02-25 MED ORDER — POLYETHYLENE GLYCOL 3350 17 G PO PACK: 17.0000 g | PACK | Freq: Two times a day (BID) | ORAL | Status: DC | PRN

## 2022-02-25 NOTE — Plan of Care (Signed)
  Problem: Activity: Goal: Risk for activity intolerance will decrease Outcome: Progressing   Problem: Pain Managment: Goal: General experience of comfort will improve Outcome: Progressing   Problem: Fluid Volume: Goal: Compliance with measures to maintain balanced fluid volume will improve Outcome: Progressing   Problem: Health Behavior/Discharge Planning: Goal: Ability to manage health-related needs will improve Outcome: Progressing   Problem: Nutritional: Goal: Ability to make healthy dietary choices will improve Outcome: Progressing   Problem: Clinical Measurements: Goal: Complications related to the disease process, condition or treatment will be avoided or minimized Outcome: Progressing

## 2022-02-25 NOTE — Progress Notes (Signed)
PROGRESS NOTE  [name] GYJ:[id number] DOB: 10-Dec-1965   PCP: [name], MD   DOA: 08/26/2021 LOS: 35  Chief complaints Chief Complaint  Patient presents with   Wound Infection     Brief Narrative / Interim history: 56 year old female with PMH significant of hypertension, hyperlipidemia, diabetes mellitus type 2, end-stage renal disease on hemodialysis, status post left BKA for chronic calcaneus osteomyelitis on 01/12/2020, bipolar disorder, left distal femoral fracture after a fall on 07/19/2021 deemed  nonoperative, admission from 08/03/2021-08/23/2021 for left femoral osteomyelitis requiring partial resection of femur and rearrangements of bony fragments with subsequent antibiotic treatment presented to the hospital with left hip bleeding and was found to have subacute osteomyelitis and abscess of left femur.  Orthopedic/ID were consulted and patient was started on broad-spectrum antibiotics.  Patient underwent multiple I&D's by Dr. [name] along with local tissue rearrangement.  Patient was also seen by vascular surgery who did fistula branch ligation.  Patient has completed 6-week course of daptomycin on 10/09/2021.  She has been unable to complete hemodialysis in chair and outpatient hemodialysis unit has been hard to find. She is otherwise medically stable for discharg   Subjective: Seen and examined earlier this morning.  No major events overnight of this morning.  She is complaining about getting scheduled MiraLAX and requests discontinuation.  No other complaints.  Objective: Vitals:   02/24/22 0939 02/24/22 1630 02/24/22 2209 02/25/22 0955  BP: (!) 171/64 (!) 169/102 (!) 173/67 (!) 179/65  Pulse: 70 65 73 69  Resp: 18 16 18 17   Temp: 98.6 F (37 C) 98.1 F (36.7 C) 98.3 F (36.8 C) 98.2 F (36.8 C)  TempSrc: Oral Oral Oral Oral  SpO2: 100% 100% 99% 100%  Weight:      Height:        Examination:  GENERAL: No apparent distress.  Nontoxic. HEENT: MMM.  Vision and  hearing grossly intact.  NECK: Supple.  No apparent JVD.  RESP:  No IWOB.  Fair aeration bilaterally. CVS:  RRR. Heart sounds normal.  ABD/GI/GU: BS+. Abd soft, NTND.  MSK/EXT: Left BKA.  No edema.  SKIN: no apparent skin lesion or wound NEURO: Awake, alert and oriented appropriately.  No apparent focal neuro deficit. PSYCH: Calm. Normal affect.   Assessment and plan: 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  Active Problems:   ESRD (end stage renal disease) on dialysis ([place])   Type 2 diabetes mellitus with hyperlipidemia (HCC)   History of CVA (cerebrovascular accident)   Essential hypertension   Anxiety and depression   GASTROESOPHAGEAL REFLUX, NO ESOPHAGITIS   TOBACCO DEPENDENCE   Obesity (BMI 30.0-34.9)  Subacute osteomyelitis of left femur with abscess  -s/p multiple debridements by Dr. [name].  -Left BKA. -Completed a 6-week course of daptomycin on 10/09/2021.    -Re-imaging on 12/02/2021 of left knee showed healing fracture with callus.   -Continue fentanyl patch, methocarbamol, and oxycodone -Patient able to tolerate wheelchair for mobility but not for HD.    ESRD on HD MWF Hyponatremia, hyperphosphatemia, hyperkalemia.  -S/p fistula revision and branch ligation.  -Nephrology managing -On oral Lasix 80 mg daily -Sodium zirconium on non HD days.    Anemia of chronic due to renal disease  -SP 2 units PRBC transfusion. -Continue with EPO per nephrology   Metabolic bone disease,  -continue with sevelamer.    History of CVA -Continue statin    Controlled IDDM-2 with hyperglycemia: A1c 5.9% on 11/14/2021. Recent Labs  Lab 02/24/22  1124 02/24/22 1634 02/24/22 2126 02/25/22 0801 02/25/22 1231  GLUCAP 135* 175* 157* 114* 145*  -Continue current insulin regimen -Check hemoglobin A1c   Essential hypertension: BP slightly elevated. -Continue hydralazine 50 mg 3 times daily -Increase Coreg to 6.25 mg twice daily   Anxiety and  depression: Clinically stable.    GERD -Continue with pantoprazole 40 mg daily.    TOBACCO DEPENDENCE -Encouraged cessation, declines patch    Obesity (BMI 30.0-34.9) Body mass index is 31.25 kg/m.          DVT prophylaxis:  Place TED hose Start: 09/06/21 1507 SCDs Start: 08/31/21 1058  Code Status: Full code Family Communication: None at bedside Level of care: Med-Surg Status is: Inpatient Remains inpatient appropriate because: Due to intolerance of HD in chair   Final disposition: SNF Consultants:  Orthopedic surgery Nephrology  Sch Meds:  Scheduled Meds:  (feeding supplement) PROSource Plus  30 mL Oral BID BM   alosetron  1 mg Oral BID   amitriptyline  50 mg Oral QHS   atorvastatin  40 mg Oral q1800   carvedilol  3.125 mg Oral BID   docusate sodium  200 mg Oral BID   fentaNYL  1 patch Transdermal Q72H   furosemide  80 mg Oral Daily   hydrALAZINE  50 mg Oral TID   insulin aspart  0-9 Units Subcutaneous TID WC   insulin aspart  6 Units Subcutaneous TID WC   insulin glargine-yfgn  10 Units Subcutaneous QHS   methocarbamol  500 mg Oral QID   mirtazapine  7.5 mg Oral QHS   multivitamin  1 tablet Oral QHS   pantoprazole  40 mg Oral Daily   sevelamer carbonate  2,400 mg Oral TID WC   sodium zirconium cyclosilicate  10 g Oral Once per day on Sun Tue Thu Sat   Continuous Infusions:  albumin human     PRN Meds:.acetaminophen, albumin human, bisacodyl, calcium carbonate, camphor-menthol, ondansetron, mouth rinse, oxyCODONE, polyethylene glycol, senna-docusate  Antimicrobials: Anti-infectives (From admission, onward)    Start     Dose/Rate Route Frequency Ordered Stop   12/15/21 1730  cefTRIAXone (ROCEPHIN) 2 g in sodium chloride 0.9 % 100 mL IVPB        2 g 200 mL/hr over 30 Minutes Intravenous Every 24 hours 12/15/21 1633 12/18/21 1750   12/14/21 1800  doxycycline (VIBRA-TABS) tablet 100 mg  Status:  Discontinued        100 mg Oral Every 12 hours 12/14/21  1607 12/15/21 1633   10/02/21 2000  DAPTOmycin (CUBICIN) 900 mg in sodium chloride 0.9 % IVPB  Status:  Discontinued        10 mg/kg  92.4 kg 136 mL/hr over 30 Minutes Intravenous Once per day on Mon Wed Fri 10/01/21 1350 10/12/21 1146   09/30/21 2000  DAPTOmycin (CUBICIN) 900 mg in sodium chloride 0.9 % IVPB  Status:  Discontinued        10 mg/kg  92.4 kg 136 mL/hr over 30 Minutes Intravenous Daily 09/28/21 0919 10/01/21 1350   09/28/21 2000  DAPTOmycin (CUBICIN) 900 mg in sodium chloride 0.9 % IVPB        900 mg 136 mL/hr over 30 Minutes Intravenous  Once 09/28/21 0919 09/28/21 2146   09/02/21 2000  DAPTOmycin (CUBICIN) 900 mg in sodium chloride 0.9 % IVPB  Status:  Discontinued        10 mg/kg  92.4 kg 136 mL/hr over 30 Minutes Intravenous Once per day on Mon Wed Fri  09/02/21 0836 09/28/21 0919   08/31/21 0745  ceFAZolin (ANCEF) IVPB 2g/100 mL premix        2 g 200 mL/hr over 30 Minutes Intravenous To Short Stay 08/31/21 0730 08/31/21 0918   08/30/21 0600  ceFAZolin (ANCEF) IVPB 2g/100 mL premix  Status:  Discontinued        2 g 200 mL/hr over 30 Minutes Intravenous To Short Stay 08/30/21 0123 08/31/21 0600   08/29/21 2000  DAPTOmycin (CUBICIN) 900 mg in sodium chloride 0.9 % IVPB  Status:  Discontinued        10 mg/kg  92.4 kg 136 mL/hr over 30 Minutes Intravenous Every 48 hours 08/29/21 1627 09/02/21 0836   08/28/21 1800  ceFAZolin (ANCEF) IVPB 2g/100 mL premix  Status:  Discontinued        2 g 200 mL/hr over 30 Minutes Intravenous Every M-W-F (1800) 08/27/21 0846 08/29/21 1620   08/28/21 1200  ceFAZolin (ANCEF) IVPB 1 g/50 mL premix  Status:  Discontinued        1 g 100 mL/hr over 30 Minutes Intravenous Every M-W-F (Hemodialysis) 08/26/21 1954 08/27/21 0846   08/28/21 0600  ceFAZolin (ANCEF) IVPB 2g/100 mL premix        2 g 200 mL/hr over 30 Minutes Intravenous On call to O.R. 08/27/21 1150 08/28/21 0909   08/27/21 1800  ceFAZolin (ANCEF) IVPB 1 g/50 mL premix        1  g 100 mL/hr over 30 Minutes Intravenous  Once 08/26/21 1954 08/27/21 2048   08/26/21 1500  ceFEPIme (MAXIPIME) 1 g in sodium chloride 0.9 % 100 mL IVPB  Status:  Discontinued        1 g 200 mL/hr over 30 Minutes Intravenous Every 24 hours 08/26/21 1452 08/26/21 1714        I have personally reviewed the following labs and images: CBC: Recent Labs  Lab 02/19/22 0912 02/21/22 2003  WBC 6.2 7.0  HGB 11.3* 11.7*  HCT 34.6* 35.2*  MCV 90.8 88.9  PLT 194 213   BMP &GFR Recent Labs  Lab 02/19/22 0913 02/21/22 2003  NA 127* 132*  K 5.1 3.5  CL 87* 94*  CO2 25 26  GLUCOSE 155* 227*  BUN 80* 38*  CREATININE 5.56* 3.49*  CALCIUM 9.0 8.7*  PHOS 6.2*  --    Estimated Creatinine Clearance: 23.4 mL/min (A) (by C-G formula based on SCr of 3.49 mg/dL (H)). Liver & Pancreas: Recent Labs  Lab 02/19/22 0913  ALBUMIN 3.6   No results for input(s): "LIPASE", "AMYLASE" in the last 168 hours. No results for input(s): "AMMONIA" in the last 168 hours. Diabetic: No results for input(s): "HGBA1C" in the last 72 hours. Recent Labs  Lab 02/24/22 1124 02/24/22 1634 02/24/22 2126 02/25/22 0801 02/25/22 1231  GLUCAP 135* 175* 157* 114* 145*   Cardiac Enzymes: No results for input(s): "CKTOTAL", "CKMB", "CKMBINDEX", "TROPONINI" in the last 168 hours. No results for input(s): "PROBNP" in the last 8760 hours. Coagulation Profile: No results for input(s): "INR", "PROTIME" in the last 168 hours. Thyroid Function Tests: No results for input(s): "TSH", "T4TOTAL", "FREET4", "T3FREE", "THYROIDAB" in the last 72 hours. Lipid Profile: No results for input(s): "CHOL", "HDL", "LDLCALC", "TRIG", "CHOLHDL", "LDLDIRECT" in the last 72 hours. Anemia Panel: No results for input(s): "VITAMINB12", "FOLATE", "FERRITIN", "TIBC", "IRON", "RETICCTPCT" in the last 72 hours. Urine analysis:    Component Value Date/Time   COLORURINE YELLOW (A) 08/02/2021 1750   APPEARANCEUR HAZY (A) 08/02/2021 1750  LABSPEC 1.014 08/02/2021 1750   PHURINE 5.0 08/02/2021 1750   GLUCOSEU 50 (A) 08/02/2021 1750   GLUCOSEU NEGATIVE 03/29/2014 1259   HGBUR NEGATIVE 08/02/2021 1750   BILIRUBINUR NEGATIVE 08/02/2021 1750   BILIRUBINUR neg 02/16/2017 1501   KETONESUR NEGATIVE 08/02/2021 1750   PROTEINUR 100 (A) 08/02/2021 1750   UROBILINOGEN 0.2 02/16/2017 1501   UROBILINOGEN 0.2 03/29/2014 1259   NITRITE NEGATIVE 08/02/2021 1750   LEUKOCYTESUR TRACE (A) 08/02/2021 1750   Sepsis Labs: Invalid input(s): "PROCALCITONIN", "LACTICIDVEN"  Microbiology: No results found for this or any previous visit (from the past 240 hour(s)).  Radiology Studies: No results found.    Arush Gatliff T. Success  If 7PM-7AM, please contact night-coverage [email or web address] 02/25/2022, 12:53 PM

## 2022-02-25 NOTE — Progress Notes (Signed)
Spoke to patient again about going to dialysis in a chair tomorrow morning. Patient declined saying that all we are attempting to do is to "push her out of the hospital." Stated that she can sit in the chair for 4 hours if she wants to, but that is not her goal. Stated that her goal is to be able to walk with her prosthesis before she is discharged.

## 2022-02-25 NOTE — Progress Notes (Signed)
Mobility Specialist Progress Note:    02/25/22 1200  Mobility  Activity  (Stretches and Exercises)  Range of Motion/Exercises Active;Passive;Left leg  Activity Response Tolerated well  Mobility Referral Yes  $Mobility charge 1 Mobility    Pt was agreeable to mobility session. Tolerated stretches and exercises well. Left pt in bed with all needs met.   Royetta Crochet Mobility Specialist Please contact via Solicitor or  Rehab office at [phone]

## 2022-02-25 NOTE — Progress Notes (Signed)
[facility] Progress Note   Subjective: Seen in room. She is having her coffee. Asked her to consider having HD in chair tomorrow, keeping in mind the detrimental effects of prolonged bedrest. She says she understands.    Objective Vitals:   02/24/22 0939 02/24/22 1630 02/24/22 2209 02/25/22 0955  BP: (!) 171/64 (!) 169/102 (!) 173/67 (!) 179/65  Pulse: 70 65 73 69  Resp: 18 16 18 17   Temp: 98.6 F (37 C) 98.1 F (36.7 C) 98.3 F (36.8 C) 98.2 F (36.8 C)  TempSrc: Oral Oral Oral Oral  SpO2: 100% 100% 99% 100%  Weight:      Height:       Physical Exam General: chronically ill appearing female in NAD Heart: S1,S2 No M/R/G.  Lungs: CTAB A/P Abdomen: Obese, NABS Extremities:1+ pitting edema RLE 1+ pitting edema L hip Dialysis Access: R AVF +T/B  Additional Objective Labs: Basic Metabolic Panel: Recent Labs  Lab 02/19/22 0913 02/21/22 2003  NA 127* 132*  K 5.1 3.5  CL 87* 94*  CO2 25 26  GLUCOSE 155* 227*  BUN 80* 38*  CREATININE 5.56* 3.49*  CALCIUM 9.0 8.7*  PHOS 6.2*  --    Liver Function Tests: Recent Labs  Lab 02/19/22 0913  ALBUMIN 3.6   No results for input(s): "LIPASE", "AMYLASE" in the last 168 hours. CBC: Recent Labs  Lab 02/19/22 0912 02/21/22 2003  WBC 6.2 7.0  HGB 11.3* 11.7*  HCT 34.6* 35.2*  MCV 90.8 88.9  PLT 194 213   Blood Culture    Component Value Date/Time   SDES TISSUE 08/28/2021 0917   SPECREQUEST LEFT THIGH TISSUE 08/28/2021 0917   CULT  08/28/2021 0917    MODERATE ENTEROCOCCUS FAECIUM VANCOMYCIN RESISTANT ENTEROCOCCUS ISOLATED NO ANAEROBES ISOLATED Sent to [place] for further susceptibility testing. Performed at [facility], [place] [address] [place], [place] [address]    REPTSTATUS 09/02/2021 FINAL 08/28/2021 8563    Cardiac Enzymes: No results for input(s): "CKTOTAL", "CKMB", "CKMBINDEX", "TROPONINI" in the last 168 hours. CBG: Recent Labs  Lab 02/24/22 0743 02/24/22 1124 02/24/22 1634  02/24/22 2126 02/25/22 0801  GLUCAP 120* 135* 175* 157* 114*   Iron Studies: No results for input(s): "IRON", "TIBC", "TRANSFERRIN", "FERRITIN" in the last 72 hours. @lablastinr3 @ Studies/Results: No results found. Medications:  albumin human      (feeding supplement) PROSource Plus  30 mL Oral BID BM   alosetron  1 mg Oral BID   amitriptyline  50 mg Oral QHS   atorvastatin  40 mg Oral q1800   carvedilol  3.125 mg Oral BID   docusate sodium  200 mg Oral BID   fentaNYL  1 patch Transdermal Q72H   furosemide  80 mg Oral Daily   hydrALAZINE  50 mg Oral TID   insulin aspart  0-9 Units Subcutaneous TID WC   insulin aspart  6 Units Subcutaneous TID WC   insulin glargine-yfgn  10 Units Subcutaneous QHS   methocarbamol  500 mg Oral QID   mirtazapine  7.5 mg Oral QHS   multivitamin  1 tablet Oral QHS   pantoprazole  40 mg Oral Daily   sevelamer carbonate  2,400 mg Oral TID WC   sodium zirconium cyclosilicate  10 g Oral Once per day on Sun Tue Thu Sat     Outpatient Dialysis Orders: MWF at [place], 400/500, EDW 90kg, 2K/2Ca, LUE AVF - No heparin prior to admission, no ESA     Assessment/Plan: 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 Completed 6 wk course of IV Daptomycin on 10/12/21. Per Dr. Jess Barters note 12/28/2021 "NO RESTRICTIONS L HIP".  Chronic L hip/residual limb pain: Hx fractures of L femoral neck (where hardware was removed) as well as distal femur Fx. Pain control per primary.  ESRD: Usual MWF schedule. Next HD 02/26/2022  Clots HD system without heparin, 4000 unit IV bolus and Heparin 2000 units IV mid-run bolus TIW.  Hyperkalemia: Stable on scheduled lokelma 4 days a week. HTN/volume: BP meds have been tapered down. Large fluids gains between HD which has been discussed extensively, Ongoing edema bilateral lower extremity,   Last Na 127 suggesting hypervolemia. Tolerates 4L UF goals.  Anemia of ESRD: Hgb 11.3-stable.   Last tsat 54%. Aranesp on hold. Secondary HPTH: CorrCa ok, no VDRA. Phos 6.2, continue Renvela as binder. Needs diet compliance  Nutrition: Alb 3.7, continue protein supplements.  Renal diet w/fluid restrictions.   AVF dysfunction (resolved): S/p branch ligation 09/19/2021  Dr. Virl Cagey. AVF working well now. DM2: Insulin per primary Dispo: SNF previously recommended, but she declined. Not home HD candidate.  Dialyzed in recliner on 9/4 but signed off early due to pain, has declined to come in a recliner since then.  Strongly discussed on the importance of increasing her mobility as much as possible while she is in her room in addition to following her ongoing fluid restriction. Also reinforced the importance of being in the chair for HD treatments to improve strength and mobility- declines again today. Does not like to do PT after HD, tried to do HD in the afternoons so she can do PT in the AM but she refuses PT on HD days. Per Renal Navigator notes there are no clinics that will accept patient unless she will have HD in chair. Will continue to encourage use of chair for HD. Ultimately patient is making her own poor choices that is prolonging hospitalization.   Tovia Kisner H. Zoee Heeney NP-C 02/25/2022, 11:59 AM  [employer] [phone]

## 2022-02-26 ENCOUNTER — Encounter (HOSPITAL_COMMUNITY): Payer: Self-pay

## 2022-02-26 DIAGNOSIS — E669 Obesity, unspecified: Secondary | ICD-10-CM | POA: Diagnosis not present

## 2022-02-26 DIAGNOSIS — E1169 Type 2 diabetes mellitus with other specified complication: Secondary | ICD-10-CM | POA: Diagnosis not present

## 2022-02-26 DIAGNOSIS — N186 End stage renal disease: Secondary | ICD-10-CM | POA: Diagnosis not present

## 2022-02-26 DIAGNOSIS — M86252 Subacute osteomyelitis, left femur: Secondary | ICD-10-CM | POA: Diagnosis not present

## 2022-02-26 LAB — GLUCOSE, CAPILLARY
Glucose-Capillary: 145 mg/dL — ABNORMAL HIGH (ref 70–99)
Glucose-Capillary: 109 mg/dL — ABNORMAL HIGH (ref 70–99)
Glucose-Capillary: 217 mg/dL — ABNORMAL HIGH (ref 70–99)
Glucose-Capillary: 108 mg/dL — ABNORMAL HIGH (ref 70–99)

## 2022-02-26 MED ORDER — HYDRALAZINE HCL 20 MG/ML IJ SOLN
5.0000 mg | Freq: Three times a day (TID) | INTRAMUSCULAR | Status: DC | PRN
  Filled 2022-02-26: qty 1

## 2022-02-26 MED ORDER — HEPARIN SODIUM (PORCINE) 1000 UNIT/ML DIALYSIS
4000.0000 [IU] | Freq: Once | INTRAMUSCULAR | Status: DC
  Filled 2022-02-26: qty 4

## 2022-02-26 MED ORDER — PENTAFLUOROPROP-TETRAFLUOROETH EX AERO: 1.0000 | INHALATION_SPRAY | CUTANEOUS | Status: DC | PRN

## 2022-02-26 MED ORDER — PROMETHAZINE HCL 25 MG PO TABS
12.5000 mg | ORAL_TABLET | Freq: Four times a day (QID) | ORAL | Status: DC | PRN
  Administered 2022-02-26 – 2022-03-05 (×4): 12.5 mg via ORAL
  Filled 2022-02-26 (×4): qty 1

## 2022-02-26 MED ORDER — SODIUM CHLORIDE 0.9 % IV SOLN
6.2500 mg | Freq: Four times a day (QID) | INTRAVENOUS | Status: DC | PRN
  Filled 2022-02-26: qty 0.25

## 2022-02-26 MED ORDER — HEPARIN SODIUM (PORCINE) 1000 UNIT/ML IJ SOLN
2000.0000 [IU] | Freq: Once | INTRAMUSCULAR | Status: AC
  Administered 2022-03-02: 2000 [IU] via INTRAVENOUS
  Filled 2022-02-26 (×2): qty 2

## 2022-02-26 NOTE — TOC Progression Note (Signed)
Transition of Care [facility]) - Initial/Assessment Note    Patient Details  Name: [name] MRN: [id number] Date of Birth: 07-17-1965  Transition of Care [facility]) CM/SW Contact:    Milinda Antis, [place] Phone Number: 02/26/2022, 12:07 PM  Clinical Narrative:                 TOC following patient for any d/c planning needs once medically stable.    Patient cannot discharge to any location until patient shows the ability to sit for an entire dialysis session.    Barriers to discharge= patient not sitting for dialysis    Lind Covert, MSW, LCSW  Expected Discharge Plan: [place] Barriers to Discharge: Continued Medical Work up   Patient Goals and CMS Choice Patient states their goals for this hospitalization and ongoing recovery are:: To return home CMS Medicare.gov Compare Post Acute Care list provided to:: Patient Choice offered to / list presented to : NA      Expected Discharge Plan and Services   Discharge Planning Services: CM Consult   Living arrangements for the past 2 months: Single Family Home                                      Prior Living Arrangements/Services Living arrangements for the past 2 months: Single Family Home Lives with:: Adult Children Patient language and need for interpreter reviewed:: Yes Do you feel safe going back to the place where you live?: Yes      Need for Family Participation in Patient Care: Yes (Comment) Care giver support system in place?: Yes (comment) Current home services: DME ([employer], w/c) Criminal Activity/Legal Involvement Pertinent to Current Situation/Hospitalization: No - Comment as needed  Activities of Daily Living Home Assistive Devices/Equipment: Eyeglasses, Wheelchair ADL Screening (condition at time of admission) Patient's cognitive ability adequate to safely complete daily activities?: Yes Is the patient deaf or have difficulty hearing?: No Does the patient have difficulty seeing,  even when wearing glasses/contacts?: No Does the patient have difficulty concentrating, remembering, or making decisions?: No Patient able to express need for assistance with ADLs?: Yes Does the patient have difficulty dressing or bathing?: Yes Independently performs ADLs?: No Communication: Independent Dressing (OT): Independent Is this a change from baseline?: Pre-admission baseline Grooming: Independent Feeding: Independent Bathing: Needs assistance Is this a change from baseline?: Pre-admission baseline Toileting: Needs assistance Is this a change from baseline?: Pre-admission baseline In/Out Bed: Needs assistance, Dependent Is this a change from baseline?: Pre-admission baseline Walks in Home: Dependent Is this a change from baseline?: Pre-admission baseline Does the patient have difficulty walking or climbing stairs?: Yes Weakness of Legs: Both Weakness of Arms/Hands: Both  Permission Sought/Granted Permission sought to share information with : Case Manager, Family Supports Permission granted to share information with : Yes, Verbal Permission Granted              Emotional Assessment Appearance:: Appears stated age Attitude/Demeanor/Rapport: Engaged, Gracious Affect (typically observed): Accepting, Appropriate, Calm, Hopeful Orientation: : Oriented to Self, Oriented to Place, Oriented to  Time, Oriented to Situation Alcohol / Substance Use: Not Applicable Psych Involvement: No (comment)  Admission diagnosis:  Wound infection [T14.8XXA, L08.9] Abscess of left hip [L02.416] Patient Active Problem List   Diagnosis Date Noted   Wound dehiscence, surgical, sequela    Abscess of left hip 08/26/2021   History of CVA (cerebrovascular accident) 05/22/2246   Hardware complicating  wound infection ([place])    Subacute osteomyelitis of left femur with abscess     Septic arthritis of hip ([place]) 07/30/2021   Skin abscess L hip 07/28/2021   HCAP (healthcare-associated pneumonia)  07/27/2021   Hyperkalemia 33/82/5053   Metabolic acidemia 97/67/3419   Closed fracture of left distal femur ([place]) 07/22/2021   Type 2 diabetes mellitus with hyperlipidemia ([place]) 07/22/2021   Infection of superficial incisional surgical site after procedure 03/09/2020   Abscess    Hypoglycemia    Essential hypertension    Sleep disturbance    ESRD (end stage renal disease) on dialysis ([place]) 01/24/2020   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[place]) 01/23/2020   Hyponatremia    Constipation    Chronic osteomyelitis involving left ankle and foot ([place])    Ulcer of left foot with necrosis of bone ([place])    Wound infection 12/28/2019   History of [place] amputation of left foot ([place]) 12/25/2017   History of CVA (cerebrovascular accident) without residual deficits 07/04/2017   Cerebral thrombosis with cerebral infarction 04/08/2017   Right sided weakness 04/07/2017   Hyperhidrosis 09/01/2016   Migraine with aura and without status migrainosus, not intractable 07/28/2016   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[place]) 08/04/2014   CKD stage 3 due to type 2 diabetes mellitus ([place]) 06/27/2014   Vitamin D insufficiency 05/08/2014   Obesity (BMI 30.0-34.9) 05/08/2014   Unilateral amputation of left foot ([place]) 05/08/2014   Bursitis of left shoulder 02/14/2014   Neck pain 01/03/2014   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.23) 01/14/2013   Insomnia 08/04/2012   Diabetic neuropathy, painful ([place]) 08/04/2011   Anxiety and depression 05/16/2010   Female stress incontinence 11/01/2007   TOBACCO DEPENDENCE 04/30/2006   GASTROESOPHAGEAL REFLUX, NO ESOPHAGITIS 04/30/2006   Irritable bowel syndrome 04/30/2006   PCP:  [name], MD Pharmacy:   [facility], [place] [address] [place] [address] Phone: [phone] Fax: [phone]  [id number] - [place], [place] [address] [place] [address] Phone: [phone] Fax: [phone]     Social Determinants of Health (SDOH) Social History: SDOH Screenings   Food Insecurity: No Food Insecurity (12/30/2021)  Housing: Low Risk  (12/30/2021)  Transportation Needs: No Transportation Needs (12/30/2021)  Utilities: Not At Risk (12/30/2021)  Depression (PHQ2-9): Medium Risk (03/09/2020)  Tobacco Use: High Risk (09/20/2021)   SDOH Interventions:     Readmission Risk Interventions    07/26/2021    3:15 PM  Readmission Risk Prevention Plan  Transportation Screening Complete  PCP or Specialist Appt within 3-5 Days Complete  HRI or [place] Complete  Palliative Care Screening Not Applicable  Medication Review (RN Care Manager) Referral to Pharmacy

## 2022-02-26 NOTE — Progress Notes (Signed)
[facility]                                                                               [name], is a 56 y.o. female, DOB - Jan 17, 1966, IRJ:[id number] Admit date - 08/26/2021    Outpatient Primary MD for the patient is [name], MD  LOS - 184  days    Brief summary   This 56 year old female with PMH significant of hypertension, hyperlipidemia, diabetes mellitus type 2, end-stage renal disease on hemodialysis, status post left BKA for chronic calcaneus osteomyelitis on 01/12/2020, bipolar disorder, left distal femoral fracture after a fall on 07/19/2021 deemed  nonoperative, admission from 08/03/2021-08/23/2021 for left femoral osteomyelitis requiring partial resection of femur and rearrangements of bony fragments with subsequent antibiotic treatment presented to the hospital with left hip bleeding and was found to have subacute osteomyelitis and abscess of left femur.  Orthopedic/ID were consulted and patient was started on broad-spectrum antibiotics.  Patient underwent multiple I&D's by Dr. [name] along with local tissue rearrangement.  Patient was also seen by vascular surgery who did fistula branch ligation.  Patient has completed 6-week course of daptomycin on 10/09/2021.  She has been unable to complete hemodialysis in chair and outpatient hemodialysis unit has been hard to find. She is otherwise medically stable for discharge.       Assessment & Plan    Assessment and Plan: * Subacute osteomyelitis of left femur with abscess  Status post surgical intervention by Dr. [name] with multiple left hip debridements.   Left BKA. She has completed a 6-week course of daptomycin on 10/09/2021.    Re-imaging on 12/02/2021 of left knee showed healing fracture with callus.    No left hip restrictions at this time.  Continue fentanyl patch, methocarbamol, and oxycodone (decrease frequency to q 8 hrs prn).  Patient able to tolerate wheelchair for mobility but not for HD.  She has been  refusing PT . Recommend compliance to PT.   ESRD (end stage renal disease) on dialysis ([place]) Sp fistula revision and branch ligation.  Hyponatremia, hyperphosphatemia, hyperkalemia.   Continue renal replacement therapy M-W-F.  Continue furosemide 80 mg daily. She does not appear to be decompensated.  Sodium zirconium on non HD days.  Repeat labs today.   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 Continue with EPO  Hemoglobin around 11. Check cbc today.  Metabolic bone disease, continue with sevelamer.   Patient has not been able to tolerate chair HD due to left hip pain.   History of CVA (cerebrovascular accident) Continue statin   Type 2 diabetes mellitus with hyperlipidemia (HCC) A1C of 7.1 in 07/2021 Uncontrolled hyperglycemia.  CBG (last 3)  Recent Labs    02/25/22 2116 02/26/22 0654 02/26/22 1105  GLUCAP 208* 145* 109*   Better controlled.  Continue insulin therapy basal 10 units and pre meal insulin 6 units.    Continue with statin therapy.   Essential hypertension Not well controlled. Added hydralazine prn.  Continue with amlodipine, carvedilol and hydralazine.   Anxiety and depression Clinically stable.   GASTROESOPHAGEAL REFLUX, NO ESOPHAGITIS Continue with pantoprazole 40 mg daily.   TOBACCO  DEPENDENCE Encouraged cessation, declines patch   Obesity (BMI 30.0-34.9) Calculated BMI 30.7 consistent with obesity class 1.      Estimated body mass index is 31.25 kg/m as calculated from the following:   Height as of this encounter: 5\' 10"  (1.778 m).   Weight as of this encounter: 98.8 kg.  Code Status: FULL CODE.  DVT Prophylaxis:  Place TED hose Start: 09/06/21 1507 SCDs Start: 08/31/21 1058   Level of Care: Level of care: Med-Surg Family Communication: none at bedside.   Disposition Plan:     snf placement.   Procedures:  None.   Consultants:   Nephrology.   Antimicrobials:   Anti-infectives (From admission,  onward)    Start     Dose/Rate Route Frequency Ordered Stop   12/15/21 1730  cefTRIAXone (ROCEPHIN) 2 g in sodium chloride 0.9 % 100 mL IVPB        2 g 200 mL/hr over 30 Minutes Intravenous Every 24 hours 12/15/21 1633 12/18/21 1750   12/14/21 1800  doxycycline (VIBRA-TABS) tablet 100 mg  Status:  Discontinued        100 mg Oral Every 12 hours 12/14/21 1607 12/15/21 1633   10/02/21 2000  DAPTOmycin (CUBICIN) 900 mg in sodium chloride 0.9 % IVPB  Status:  Discontinued        10 mg/kg  92.4 kg 136 mL/hr over 30 Minutes Intravenous Once per day on Mon Wed Fri 10/01/21 1350 10/12/21 1146   09/30/21 2000  DAPTOmycin (CUBICIN) 900 mg in sodium chloride 0.9 % IVPB  Status:  Discontinued        10 mg/kg  92.4 kg 136 mL/hr over 30 Minutes Intravenous Daily 09/28/21 0919 10/01/21 1350   09/28/21 2000  DAPTOmycin (CUBICIN) 900 mg in sodium chloride 0.9 % IVPB        900 mg 136 mL/hr over 30 Minutes Intravenous  Once 09/28/21 0919 09/28/21 2146   09/02/21 2000  DAPTOmycin (CUBICIN) 900 mg in sodium chloride 0.9 % IVPB  Status:  Discontinued        10 mg/kg  92.4 kg 136 mL/hr over 30 Minutes Intravenous Once per day on Mon Wed Fri 09/02/21 0836 09/28/21 0919   08/31/21 0745  ceFAZolin (ANCEF) IVPB 2g/100 mL premix        2 g 200 mL/hr over 30 Minutes Intravenous To Short Stay 08/31/21 0730 08/31/21 0918   08/30/21 0600  ceFAZolin (ANCEF) IVPB 2g/100 mL premix  Status:  Discontinued        2 g 200 mL/hr over 30 Minutes Intravenous To Short Stay 08/30/21 0123 08/31/21 0600   08/29/21 2000  DAPTOmycin (CUBICIN) 900 mg in sodium chloride 0.9 % IVPB  Status:  Discontinued        10 mg/kg  92.4 kg 136 mL/hr over 30 Minutes Intravenous Every 48 hours 08/29/21 1627 09/02/21 0836   08/28/21 1800  ceFAZolin (ANCEF) IVPB 2g/100 mL premix  Status:  Discontinued        2 g 200 mL/hr over 30 Minutes Intravenous Every M-W-F (1800) 08/27/21 0846 08/29/21 1620   08/28/21 1200  ceFAZolin (ANCEF) IVPB 1 g/50 mL  premix  Status:  Discontinued        1 g 100 mL/hr over 30 Minutes Intravenous Every M-W-F (Hemodialysis) 08/26/21 1954 08/27/21 0846   08/28/21 0600  ceFAZolin (ANCEF) IVPB 2g/100 mL premix        2 g 200 mL/hr over 30 Minutes Intravenous On call to O.R. 08/27/21 1150 08/28/21 9485  08/27/21 1800  ceFAZolin (ANCEF) IVPB 1 g/50 mL premix        1 g 100 mL/hr over 30 Minutes Intravenous  Once 08/26/21 1954 08/27/21 2048   08/26/21 1500  ceFEPIme (MAXIPIME) 1 g in sodium chloride 0.9 % 100 mL IVPB  Status:  Discontinued        1 g 200 mL/hr over 30 Minutes Intravenous Every 24 hours 08/26/21 1452 08/26/21 1714        Medications  Scheduled Meds:  (feeding supplement) PROSource Plus  30 mL Oral BID BM   alosetron  1 mg Oral BID   amitriptyline  50 mg Oral QHS   atorvastatin  40 mg Oral q1800   calcitRIOL  0.25 mcg Oral Daily   carvedilol  6.25 mg Oral BID   docusate sodium  200 mg Oral BID   fentaNYL  1 patch Transdermal Q72H   furosemide  80 mg Oral Daily   heparin  4,000 Units Dialysis Once in dialysis   heparin sodium (porcine)  2,000 Units Intravenous Once in dialysis   hydrALAZINE  50 mg Oral TID   insulin aspart  0-9 Units Subcutaneous TID WC   insulin aspart  6 Units Subcutaneous TID WC   insulin glargine-yfgn  10 Units Subcutaneous QHS   methocarbamol  500 mg Oral QID   mirtazapine  7.5 mg Oral QHS   multivitamin  1 tablet Oral QHS   pantoprazole  40 mg Oral Daily   sevelamer carbonate  2,400 mg Oral TID WC   sodium zirconium cyclosilicate  10 g Oral Once per day on Sun Tue Thu Sat   Continuous Infusions:  albumin human     PRN Meds:.acetaminophen, albumin human, bisacodyl, calcium carbonate, camphor-menthol, ondansetron, mouth rinse, oxyCODONE, pentafluoroprop-tetrafluoroeth, polyethylene glycol, promethazine, senna-docusate    Subjective:   [name] was seen and examined today. Nauseated, no vomiting or abdominal pain.  Objective:   Vitals:    02/25/22 0955 02/25/22 1648 02/25/22 2117 02/26/22 0946  BP: (!) 179/65 (!) 177/59 (!) 161/51 (!) 184/63  Pulse: 69 65 69 64  Resp: 17 17 18 17   Temp: 98.2 F (36.8 C) 98.5 F (36.9 C) 98 F (36.7 C) 98.1 F (36.7 C)  TempSrc: Oral Oral Oral   SpO2: 100% 97% 98% 100%  Weight:      Height:        Intake/Output Summary (Last 24 hours) at 02/26/2022 1256 Last data filed at 02/26/2022 9323 Gross per 24 hour  Intake 540 ml  Output 1400 ml  Net -860 ml   Filed Weights   02/19/22 1035 02/22/22 2127 02/23/22 1041  Weight: 98.3 kg 94.9 kg 98.8 kg     Exam General exam: Ill appearing lady  not in distress.  Respiratory system: Clear to auscultation. Respiratory effort normal. Cardiovascular system: S1 & S2 heard, RRR. No JVD, Gastrointestinal system: Abdomen is nondistended, soft and nontender.  Central nervous system: Alert and oriented. No focal neurological deficits. Extremities: Symmetric 5 x 5 power. Skin: No rashes,  Psychiatry: Mood & affect appropriate.     Data Reviewed:  I have personally reviewed following labs and imaging studies   CBC Lab Results  Component Value Date   WBC 7.0 02/21/2022   RBC 3.96 02/21/2022   HGB 11.7 (L) 02/21/2022   HCT 35.2 (L) 02/21/2022   MCV 88.9 02/21/2022   MCH 29.5 02/21/2022   PLT 213 02/21/2022   MCHC 33.2 02/21/2022   RDW 13.3 02/21/2022   LYMPHSABS  1.8 02/03/2022   MONOABS 0.9 02/03/2022   EOSABS 0.2 02/03/2022   BASOSABS 0.1 25/83/4621     Last metabolic panel Lab Results  Component Value Date   NA 132 (L) 02/21/2022   K 3.5 02/21/2022   CL 94 (L) 02/21/2022   CO2 26 02/21/2022   BUN 38 (H) 02/21/2022   CREATININE 3.49 (H) 02/21/2022   GLUCOSE 227 (H) 02/21/2022   GFRNONAA 15 (L) 02/21/2022   GFRAA 13 (L) 12/17/2018   CALCIUM 8.7 (L) 02/21/2022   PHOS 6.2 (H) 02/19/2022   PROT 7.6 12/02/2021   ALBUMIN 3.6 02/19/2022   LABGLOB 3.1 10/29/2018   AGRATIO 1.4 10/29/2018   BILITOT 0.6 12/02/2021   ALKPHOS  105 12/02/2021   AST 16 12/02/2021   ALT 15 12/02/2021   ANIONGAP 12 02/21/2022    CBG (last 3)  Recent Labs    02/25/22 2116 02/26/22 0654 02/26/22 1105  GLUCAP 208* 145* 109*      Coagulation Profile: No results for input(s): "INR", "PROTIME" in the last 168 hours.   Radiology Studies: No results found.     Hosie Poisson M.D. [facility] 02/26/2022, 12:56 PM  Available via Epic secure chat 7am-7pm After 7 pm, please refer to night coverage provider listed on amion.

## 2022-02-26 NOTE — Progress Notes (Signed)
Dialysis called at 07:30 am to inquire about pt. Coming to get dialysis treatment this am, pt. States " I just got my tray and I don't want to go to dialysis now, I will go later", dialysis made aware to bump pt. To a later time  Anastasio Auerbach

## 2022-02-26 NOTE — Progress Notes (Signed)
[facility] Progress Note   Subjective: Seen in room. Upset about having to go to HD prior to eating lunch. Encouraged not to delay HD.    Objective Vitals:   02/25/22 0955 02/25/22 1648 02/25/22 2117 02/26/22 0946  BP: (!) 179/65 (!) 177/59 (!) 161/51 (!) 184/63  Pulse: 69 65 69 64  Resp: 17 17 18 17   Temp: 98.2 F (36.8 C) 98.5 F (36.9 C) 98 F (36.7 C) 98.1 F (36.7 C)  TempSrc: Oral Oral Oral   SpO2: 100% 97% 98% 100%  Weight:      Height:       Physical Exam General: chronically ill appearing female in NAD Heart: S1,S2 No M/R/G.  Lungs: CTAB A/P Abdomen: Obese, NABS Extremities:1+ pitting edema RLE 1+ pitting edema L hip Dialysis Access: R AVF +T/B   Additional Objective Labs: Basic Metabolic Panel: Recent Labs  Lab 02/21/22 2003  NA 132*  K 3.5  CL 94*  CO2 26  GLUCOSE 227*  BUN 38*  CREATININE 3.49*  CALCIUM 8.7*   Liver Function Tests: No results for input(s): "AST", "ALT", "ALKPHOS", "BILITOT", "PROT", "ALBUMIN" in the last 168 hours. No results for input(s): "LIPASE", "AMYLASE" in the last 168 hours. CBC: Recent Labs  Lab 02/21/22 2003  WBC 7.0  HGB 11.7*  HCT 35.2*  MCV 88.9  PLT 213   Blood Culture    Component Value Date/Time   SDES TISSUE 08/28/2021 0917   SPECREQUEST LEFT THIGH TISSUE 08/28/2021 0917   CULT  08/28/2021 0917    MODERATE ENTEROCOCCUS FAECIUM VANCOMYCIN RESISTANT ENTEROCOCCUS ISOLATED NO ANAEROBES ISOLATED Sent to [place] for further susceptibility testing. Performed at [facility], Agency [address], [place], [place] [address]    REPTSTATUS 09/02/2021 FINAL 08/28/2021 6045    Cardiac Enzymes: No results for input(s): "CKTOTAL", "CKMB", "CKMBINDEX", "TROPONINI" in the last 168 hours. CBG: Recent Labs  Lab 02/25/22 1231 02/25/22 1738 02/25/22 2116 02/26/22 0654 02/26/22 1105  GLUCAP 145* 196* 208* 145* 109*   Iron Studies: No results for input(s): "IRON", "TIBC", "TRANSFERRIN",  "FERRITIN" in the last 72 hours. @lablastinr3 @ Studies/Results: No results found. Medications:  albumin human      (feeding supplement) PROSource Plus  30 mL Oral BID BM   alosetron  1 mg Oral BID   amitriptyline  50 mg Oral QHS   atorvastatin  40 mg Oral q1800   calcitRIOL  0.25 mcg Oral Daily   carvedilol  6.25 mg Oral BID   docusate sodium  200 mg Oral BID   fentaNYL  1 patch Transdermal Q72H   furosemide  80 mg Oral Daily   heparin  4,000 Units Dialysis Once in dialysis   heparin sodium (porcine)  2,000 Units Intravenous Once in dialysis   hydrALAZINE  50 mg Oral TID   insulin aspart  0-9 Units Subcutaneous TID WC   insulin aspart  6 Units Subcutaneous TID WC   insulin glargine-yfgn  10 Units Subcutaneous QHS   methocarbamol  500 mg Oral QID   mirtazapine  7.5 mg Oral QHS   multivitamin  1 tablet Oral QHS   pantoprazole  40 mg Oral Daily   sevelamer carbonate  2,400 mg Oral TID WC   sodium zirconium cyclosilicate  10 g Oral Once per day on Sun Tue Thu Sat     Outpatient Dialysis Orders: MWF at [place], 400/500, EDW 90kg, 2K/2Ca, LUE AVF - No heparin prior to admission, no ESA     Assessment/Plan:  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 Completed 6 wk course of IV Daptomycin on 10/12/21. Per Dr. Jess Barters note 12/28/2021 "NO RESTRICTIONS L HIP".  Chronic L hip/residual limb pain: Hx fractures of L femoral neck (where hardware was removed) as well as distal femur Fx. Pain control per primary.  ESRD: Usual MWF schedule. Next HD 02/28/2022  Clots HD system without heparin, 4000 unit IV bolus and Heparin 2000 units IV mid-run bolus TIW.  Hyperkalemia: Stable on scheduled lokelma 4 days a week. HTN/volume: BP meds have been tapered down. Large fluids gains between HD which has been discussed extensively, Ongoing edema bilateral lower extremity,   Last Na 127 suggesting hypervolemia. Tolerates 4L UF goals.  Anemia of ESRD: Hgb  11.3-stable.  Last tsat 54%. Aranesp on hold. Secondary HPTH: CorrCa ok, no VDRA. Phos 6.2, continue Renvela as binder. Needs diet compliance  Nutrition: Alb 3.7, continue protein supplements.  Renal diet w/fluid restrictions.   AVF dysfunction (resolved): S/p branch ligation 09/19/2021  Dr. Virl Cagey. AVF working well now. DM2: Insulin per primary Dispo: SNF previously recommended, but she declined. Not home HD candidate.  Dialyzed in recliner on 9/4 but signed off early due to pain, has declined to come in a recliner since then.  Strongly discussed on the importance of increasing her mobility as much as possible while she is in her room in addition to following her ongoing fluid restriction. Also reinforced the importance of being in the chair for HD treatments to improve strength and mobility- declines again today. Does not like to do PT after HD, tried to do HD in the afternoons so she can do PT in the AM but she refuses PT on HD days. Per Renal Navigator notes there are no clinics that will accept patient unless she will have HD in chair. Will continue to encourage use of chair for HD. Ultimately patient is making her own poor choices that is prolonging hospitalization.   Antara Brecheisen H. Stanislaw Acton NP-C 02/26/2022, 12:49 PM  [employer] [phone]

## 2022-02-26 NOTE — Progress Notes (Signed)
Mobility Specialist Progress Note:   02/26/22 1157  Mobility  Activity  (Stretches and Exercises)  Range of Motion/Exercises Left leg  Activity Response Tolerated well  Mobility Referral Yes  $Mobility charge 1 Mobility    Pt was agreeable to LLE exercises and stretches. Left pt in bed with all needs met.   Royetta Crochet Mobility Specialist Please contact via Solicitor or  Rehab office at [phone]

## 2022-02-26 NOTE — Progress Notes (Signed)
Physical Therapy Treatment Patient Details Name: [name] MRN: [id number] DOB: 07/07/1965 Today's Date: 02/26/2022   History of Present Illness 56 y.o. female presented to ED 6/26 from dialysis with increased bloody drainage from L hip wound. 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 s/p hardware removal of left hip with partial resection of tuft tissue and femure that were nonviable on 08/10/21 Discharged home 6/23. underwent L hip debridement and wound vac placement 6/28; +Left intertrochanteric non-union,  Fracture of left distal femur  PMH: hypertension, hyperlipidemia, ESRD on HD MWF, history of left BKA in 2021, depression/anxiety, stroke, tobacco use, T2DM,  insomnia, chronic pain syndrome,    PT Comments    Limited treatment today. Willing to participate in bed level exercises. Extensive education for positioning. Performed and reviewed LE exercises in bed with emphasis on strengthening and increasing soft tissue flexibility. Lt knee and hip becoming contracted in flexed and externally rotated position. Discussed goals and need for SNF level rehab to achieve stated goals. Will continue to work with pt as tolerated. Patient will continue to benefit from skilled physical therapy services to further improve independence with functional mobility.   Recommendations for follow up therapy are one component of a multi-disciplinary discharge planning process, led by the attending physician.  Recommendations may be updated based on patient status, additional functional criteria and insurance authorization.  Follow Up Recommendations  Skilled nursing-short term rehab (<3 hours/day) Can patient physically be transported by private vehicle: No   Assistance Recommended at Discharge Intermittent Supervision/Assistance  Patient can return home with the following A lot of help with bathing/dressing/bathroom;Assistance with cooking/housework;Assist for  transportation;Two people to help with walking and/or transfers;Help with stairs or ramp for entrance   Equipment Recommendations  Other (comment);Hospital bed (Sliding board, drop-arm BSC; needs a wheelchair cushion; would need medical [name] Lei transport for HD)    Recommendations for Other Services       Precautions / Restrictions Precautions Precautions: Fall;Other (comment) Precaution Comments: L BKA (baseline) Restrictions Weight Bearing Restrictions: Yes LLE Weight Bearing: Weight bearing as tolerated Other Position/Activity Restrictions: L distal femur fx; per Dr [name] 8/10 pt can full WB; Wear liner during the day and shrinker overnight( pt declined donning liner 12/20 as it "made her skin itchy"     Mobility  Bed Mobility               General bed mobility comments: declined    Transfers                   General transfer comment: declined    Ambulation/Gait                   Stairs             Wheelchair Mobility    Modified Rankin (Stroke Patients Only)       Balance                                            Cognition Arousal/Alertness: Awake/alert Behavior During Therapy: WFL for tasks assessed/performed Overall Cognitive Status: Within Functional Limits for tasks assessed                                          Exercises  Amputee Exercises Quad Sets: Strengthening, Both, 10 reps, Supine Gluteal Sets: Strengthening, Both, 10 reps, Supine Hip ABduction/ADduction: Strengthening, Both, 10 reps, Supine Knee Flexion: Strengthening, Both, 10 reps, Supine Knee Extension: Strengthening, AAROM, 10 reps, Supine, Both (knee extension stretching) Straight Leg Raises: Strengthening, Left, 10 reps, Supine Other Exercises Other Exercises: Single leg, partial bridge on Rt x5 Other Exercises: Passive knee extension stretch x 2' Lt followed by contract/relax lengthening techniques x 1' Other Exercises:  Bil glute stretch (lt with towel) Rt with PT assist. x90 sec ea    General Comments General comments (skin integrity, edema, etc.): Majority of session spent reviewing exercises, positioning to prevent contracture, and discussing POC and pt goals vs realistic expectations if she remains hosptialized.      Pertinent Vitals/Pain Pain Assessment Pain Assessment: Faces Faces Pain Scale: Hurts little more Pain Location: L hip with mobility Pain Descriptors / Indicators: Discomfort, Grimacing Pain Intervention(s): Monitored during session, Repositioned    Home Living                          Prior Function            PT Goals (current goals can now be found in the care plan section) Acute Rehab PT Goals Patient Stated Goal: Wants to be independent; wants to be able to drive herself to/from HD PT Goal Formulation: With patient Time For Goal Achievement: 02/27/22 Potential to Achieve Goals: Fair Progress towards PT goals: Progressing toward goals    Frequency    Min 2X/week      PT Plan Current plan remains appropriate    Co-evaluation PT/OT/SLP Co-Evaluation/Treatment: Yes            AM-PAC PT "6 Clicks" Mobility   Outcome Measure  Help needed turning from your back to your side while in a flat bed without using bedrails?: None Help needed moving from lying on your back to sitting on the side of a flat bed without using bedrails?: A Little Help needed moving to and from a bed to a chair (including a wheelchair)?: A Little Help needed standing up from a chair using your arms (e.g., wheelchair or bedside chair)?: Total Help needed to walk in hospital room?: Total Help needed climbing 3-5 steps with a railing? : Total 6 Click Score: 13    End of Session   Activity Tolerance: Patient tolerated treatment well Patient left: in bed;with call bell/phone within reach;with bed alarm set   PT Visit Diagnosis: Other abnormalities of gait and mobility  (R26.89);Muscle weakness (generalized) (M62.81);History of falling (Z91.81);Pain Pain - Right/Left: Left Pain - part of body: Leg (and R foot and ankle)     Time: 4431-5400 PT Time Calculation (min) (ACUTE ONLY): 34 min  Charges:  $Therapeutic Exercise: 23-37 mins                     Candie Mile, PT, DPT Physical Therapist [facility] [place] 02/26/2022, 3:29 PM

## 2022-02-26 NOTE — Progress Notes (Signed)
OT Cancellation Note  Patient Details Name: [name] MRN: [id number] DOB: [birth year]   Cancelled Treatment:    Reason Eval/Treat Not Completed: Patient declined, no reason specified (Patient states she currently doesn't feel well and asked therapist to return after lunch. Will attempt later today as schedule permits.) [name], [place]  Office [phone]  [name] 02/26/2022, 8:17 AM

## 2022-02-26 NOTE — Progress Notes (Signed)
PT Cancellation Note  Patient Details Name: [name] MRN: [id number] DOB: September 09, 1965   Cancelled Treatment:    Reason Eval/Treat Not Completed: Patient declined,  States she is waiting to go to dialysis. Might be willing later today. Will follow-up as schedule allows.   Candie Mile, PT, DPT Physical Therapist [facility] [place] 02/26/2022, 1:00 PM

## 2022-02-26 NOTE — Plan of Care (Addendum)
0700: Pt refusing to go to dialysis in AM because pt wanting to get and eat her breakfast tray, dialysis had to change her dialysis time for today to keep on schedule. RN holding off on BP medications in case pt going to dialysis. Pt refusing morning labs, pt states she does not want to be stuck, pt planning to get labs drawn in dialysis.  10: RN was notified by PT that pt stated if pt wasn't taken to dialysis by a certain time then she would refuse to go to dialysis today, pt wanting to make sure she gets her meals. Pt wanting to PT to come back if she doesn't go to dialysis.  RN went to speak with to pt to educate her on the importance of getting dialysis on time, pt said she was waiting to order her lunch in case she was taken to dialysis but she wasn't going to wait a lot longer because she is a diabetic and needs to get her meals.   RN called dialysis to coordinate what time pt might go to dialysis because pt possibly going to refuse to go if she is not taken by a certain time. Dialysis unable to take pt a this time and will probably not be able to take her today due to staffing and schedule, RN made pt aware. Pt ordering food.   1500 RN rechecked pt BP and gave oral hydralazine dose, RN unable to give PRN IV hydralazine due to pt not having an IV, pt typically on oral meds,  RN made Dr.Akula aware of situation .  1720: Since pt unable to go to dialysis today RN asked pt if she would be willing to get her missed morning labs drawn by phlebotomy for today but pt refused, RN notified Dr.Akula MD   1855:RN was called by dialysis, dialysis stated they are available to bring pt for dialysis if pt willing, RN went to speak with pt to see if pt willing to go to dialysis, pt refused, pt stated "I am tired of them messing up my schedule". RN made dialysis aware

## 2022-02-27 DIAGNOSIS — M86252 Subacute osteomyelitis, left femur: Secondary | ICD-10-CM | POA: Diagnosis not present

## 2022-02-27 DIAGNOSIS — N186 End stage renal disease: Secondary | ICD-10-CM | POA: Diagnosis not present

## 2022-02-27 DIAGNOSIS — Z8673 Personal history of transient ischemic attack (TIA), and cerebral infarction without residual deficits: Secondary | ICD-10-CM | POA: Diagnosis not present

## 2022-02-27 DIAGNOSIS — E1169 Type 2 diabetes mellitus with other specified complication: Secondary | ICD-10-CM | POA: Diagnosis not present

## 2022-02-27 LAB — RENAL FUNCTION PANEL
Albumin: 3.7 g/dL (ref 3.5–5.0)
Anion gap: 16 — ABNORMAL HIGH (ref 5–15)
BUN: 125 mg/dL — ABNORMAL HIGH (ref 6–20)
CO2: 21 mmol/L — ABNORMAL LOW (ref 22–32)
Calcium: 8.5 mg/dL — ABNORMAL LOW (ref 8.9–10.3)
Chloride: 89 mmol/L — ABNORMAL LOW (ref 98–111)
Creatinine, Ser: 7.72 mg/dL — ABNORMAL HIGH (ref 0.44–1.00)
GFR, Estimated: 6 mL/min — ABNORMAL LOW (ref >60)
Glucose, Bld: 124 mg/dL — ABNORMAL HIGH (ref 70–99)
Phosphorus: 6.3 mg/dL — ABNORMAL HIGH (ref 2.5–4.6)
Potassium: 5.9 mmol/L — ABNORMAL HIGH (ref 3.5–5.1)
Sodium: 126 mmol/L — ABNORMAL LOW (ref 135–145)

## 2022-02-27 LAB — GLUCOSE, CAPILLARY
Glucose-Capillary: 174 mg/dL — ABNORMAL HIGH (ref 70–99)
Glucose-Capillary: 151 mg/dL — ABNORMAL HIGH (ref 70–99)
Glucose-Capillary: 123 mg/dL — ABNORMAL HIGH (ref 70–99)
Glucose-Capillary: 249 mg/dL — ABNORMAL HIGH (ref 70–99)

## 2022-02-27 LAB — CBC
HCT: 30.3 % — ABNORMAL LOW (ref 36.0–46.0)
Hemoglobin: 10 g/dL — ABNORMAL LOW (ref 12.0–15.0)
MCH: 29.7 pg (ref 26.0–34.0)
MCHC: 33 g/dL (ref 30.0–36.0)
MCV: 89.9 fL (ref 80.0–100.0)
Platelets: 213 10*3/uL (ref 150–400)
RBC: 3.37 MIL/uL — ABNORMAL LOW (ref 3.87–5.11)
RDW: 13.4 % (ref 11.5–15.5)
WBC: 6.5 10*3/uL (ref 4.0–10.5)
nRBC: 0 % (ref 0.0–0.2)

## 2022-02-27 LAB — HEMOGLOBIN A1C
Hgb A1c MFr Bld: 6.2 % — ABNORMAL HIGH (ref 4.8–5.6)
Mean Plasma Glucose: 131 mg/dL

## 2022-02-27 MED ORDER — SODIUM ZIRCONIUM CYCLOSILICATE 10 G PO PACK
10.0000 g | PACK | Freq: Once | ORAL | Status: AC
  Administered 2022-02-27: 10 g via ORAL
  Filled 2022-02-27: qty 1

## 2022-02-27 MED ORDER — HEPARIN SODIUM (PORCINE) 1000 UNIT/ML IJ SOLN: 2000.0000 [IU] | Freq: Once | INTRAMUSCULAR | Status: DC

## 2022-02-27 MED ORDER — HEPARIN SODIUM (PORCINE) 1000 UNIT/ML DIALYSIS
4000.0000 [IU] | Freq: Once | INTRAMUSCULAR | Status: DC
  Filled 2022-02-27: qty 4

## 2022-02-27 NOTE — Progress Notes (Addendum)
[facility] Progress Note   Subjective: Refused HD X 2 02/26/2022 once because she wanted to eat lunch. When asked about 2nd refusal, she says, "I wasn't going up there that late at night". Refusing to work with PT. Says she won't go home until she can walk however not participating/cooperating with PT or with plan of care.   Attempted to educate that she does not "have a dialysis time" in an acute care facility and that when she is called to HD, she should go to treatment. Had frank discussion with patient with CN present regarding sitting in chair for HD, being compliant with care plan. Patient alternately angry, tearful, gives multiple reasons why she behaves in the manner she does, blames physical illness however she is medically stable for discharge.  Emotional support to patient, reassured her that we wish for her to succeed and go home.    Objective Vitals:   02/26/22 1433 02/26/22 1837 02/26/22 2131 02/27/22 0907  BP: (!) 188/65 (!) 180/62 (!) 196/66 (!) 186/60  Pulse: 70 66 67   Resp: 18 18 18    Temp:  98.8 F (37.1 C) 98.1 F (36.7 C)   TempSrc:  Oral Oral   SpO2: 100% 100% 98%   Weight:      Height:       Physical Exam General: chronically ill appearing female in NAD Heart: S1,S2 No M/R/G.  Lungs: CTAB A/P Abdomen: Obese, NABS Extremities:1+ pitting edema RLE 1+ pitting edema L hip Dialysis Access: R AVF +T/B  Additional Objective Labs: Basic Metabolic Panel: Recent Labs  Lab 02/21/22 2003  NA 132*  K 3.5  CL 94*  CO2 26  GLUCOSE 227*  BUN 38*  CREATININE 3.49*  CALCIUM 8.7*   Liver Function Tests: No results for input(s): "AST", "ALT", "ALKPHOS", "BILITOT", "PROT", "ALBUMIN" in the last 168 hours. No results for input(s): "LIPASE", "AMYLASE" in the last 168 hours. CBC: Recent Labs  Lab 02/21/22 2003  WBC 7.0  HGB 11.7*  HCT 35.2*  MCV 88.9  PLT 213   Blood Culture    Component Value Date/Time   SDES TISSUE 08/28/2021 0917    SPECREQUEST LEFT THIGH TISSUE 08/28/2021 0917   CULT  08/28/2021 0917    MODERATE ENTEROCOCCUS FAECIUM VANCOMYCIN RESISTANT ENTEROCOCCUS ISOLATED NO ANAEROBES ISOLATED Sent to [place] for further susceptibility testing. Performed at [facility], [place] [address] [place], [place] [address]    REPTSTATUS 09/02/2021 FINAL 08/28/2021 4196    Cardiac Enzymes: No results for input(s): "CKTOTAL", "CKMB", "CKMBINDEX", "TROPONINI" in the last 168 hours. CBG: Recent Labs  Lab 02/26/22 0654 02/26/22 1105 02/26/22 1623 02/26/22 2130 02/27/22 0729  GLUCAP 145* 109* 217* 108* 174*   Iron Studies: No results for input(s): "IRON", "TIBC", "TRANSFERRIN", "FERRITIN" in the last 72 hours. @lablastinr3 @ Studies/Results: No results found. Medications:  albumin human      (feeding supplement) PROSource Plus  30 mL Oral BID BM   alosetron  1 mg Oral BID   amitriptyline  50 mg Oral QHS   atorvastatin  40 mg Oral q1800   calcitRIOL  0.25 mcg Oral Daily   carvedilol  6.25 mg Oral BID   docusate sodium  200 mg Oral BID   fentaNYL  1 patch Transdermal Q72H   furosemide  80 mg Oral Daily   heparin  4,000 Units Dialysis Once in dialysis   heparin sodium (porcine)  2,000 Units Intravenous Once in dialysis   hydrALAZINE  50 mg Oral TID  insulin aspart  0-9 Units Subcutaneous TID WC   insulin aspart  6 Units Subcutaneous TID WC   insulin glargine-yfgn  10 Units Subcutaneous QHS   methocarbamol  500 mg Oral QID   mirtazapine  7.5 mg Oral QHS   multivitamin  1 tablet Oral QHS   pantoprazole  40 mg Oral Daily   sevelamer carbonate  2,400 mg Oral TID WC   sodium zirconium cyclosilicate  10 g Oral Once per day on Sun Tue Thu Sat     Outpatient Dialysis Orders: MWF at [facility], 400/500, EDW 90kg, 2K/2Ca, LUE AVF - No heparin prior to admission, no ESA     Assessment/Plan: 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 Completed 6 wk course  of IV Daptomycin on 10/12/21. Per Dr. Jess Barters note 12/28/2021 "NO RESTRICTIONS L HIP".  Chronic L hip/residual limb pain: Hx fractures of L femoral neck (where hardware was removed) as well as distal femur Fx. Pain control per primary.  ESRD: Usual MWF schedule. Refused HD 02/26/2022. Will have HD off schedule today and  short HD 02/28/2022 to resume MWF schedule. Clots HD system without heparin, 4000 unit IV bolus and Heparin 2000 units IV mid-run bolus TIW.  Hyperkalemia: Stable on scheduled lokelma 4 days a week. HTN/volume: BP meds have been tapered down. Large fluids gains between HD which has been discussed extensively, Ongoing edema bilateral lower extremity,   Last Na 127 suggesting hypervolemia. Tolerates 4L UF goals.  Anemia of ESRD: Hgb 11.3-stable.  Last tsat 54%. Aranesp on hold. Secondary HPTH: CorrCa ok, no VDRA. Phos 6.2, continue Renvela as binder. Needs diet compliance  Nutrition: Alb 3.7, continue protein supplements.  Renal diet w/fluid restrictions.   AVF dysfunction (resolved): S/p branch ligation 09/19/2021  Dr. Virl Cagey. AVF working well now. DM2: Insulin per primary. BS have been stable.  Dispo: SNF previously recommended, but she declined. Not home HD candidate.  Dialyzed in recliner on 9/4 but signed off early due to pain, has declined to come in a recliner since then.  Strongly discussed on the importance of increasing her mobility as much as possible while she is in her room in addition to following her ongoing fluid restriction. Also reinforced the importance of being in the chair for HD treatments to improve strength and mobility- declines again today. Does not like to do PT after HD, tried to do HD in the afternoons so she can do PT in the AM but she refuses PT on HD days. Per Renal Navigator notes there are no clinics that will accept patient unless she will have HD in chair. Will continue to encourage use of chair for HD. Ultimately patient is making her own poor choices that  is prolonging hospitalization.     Caly Pellum H. Aoki Wedemeyer NP-C 02/27/2022, 9:22 AM  [employer] [phone]

## 2022-02-27 NOTE — Progress Notes (Signed)
Received patient in bed to unit.  Alert and oriented.  Informed consent signed and in chart.   Treatment initiated: NA Treatment completed: NA  Patient tolerated well.  Transported back to the room  Alert, without acute distress.  Hand-off given to patient's nurse.   Access used: AVF Access issues: Venous site unable to be accessed. First DT infiltrated site. 2nd DT attempt unsuccessful. 3rd DT attempt unsuccessful. PA in the unit, at the pt's bedside and made aware of unsuccessful cannulation. Pt orders moved to tomorrow to rest arm  Total UF removed: NA Medication(s) given: NA Post HD VS: NA Post HD weight: NA   [facility]

## 2022-02-27 NOTE — Progress Notes (Signed)
PROGRESS NOTE    [name]  XIP:[id number] DOB: 06/19/1965 DOA: 08/26/2021 PCP: [name], MD   Brief Narrative: This 56 year old female with PMH significant of hypertension, hyperlipidemia, diabetes mellitus type 2, end-stage renal disease on hemodialysis, status post left BKA for chronic calcaneus osteomyelitis on 01/12/2020, bipolar disorder, left distal femoral fracture after a fall on 07/19/2021 deemed  nonoperative, admission from 08/03/2021-08/23/2021 for left femoral osteomyelitis requiring partial resection of femur and rearrangements of bony fragments with subsequent antibiotic treatment presented to the hospital with left hip bleeding and was found to have subacute osteomyelitis and abscess of left femur.  Orthopedic/ID were consulted and patient was started on broad-spectrum antibiotics.  Patient underwent multiple I&D's by Dr. [name] along with local tissue rearrangement.  Patient was also seen by vascular surgery who did fistula branch ligation.  Patient has completed 6-week course of daptomycin on 10/09/2021.  She has been unable to complete hemodialysis in chair and outpatient hemodialysis unit has been hard to find. She is otherwise medically stable for discharge.      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 Orthopedic surgery consulted. ID consulted. Patient underwent hardware removal on 6/10 (prior to admission) with I&D on 6/28 and 7/1. Infectious disease recommendations for 6 weeks of Daptomycin; patient has completed course. Stable.  History of left intertrochanteric fracture This was previously managed with IM nail placement in 2021. Hardware removed secondary to above.  ESRD on HD Nephrology on board.  -HD per nephrology  Anasarca Improved with hemodialysis.  Hyponatremia Management with hemodialysis.  Hypokalemia Hyperkalemia Management with  hemodialysis  Anemia of chronic disease Acute blood loss anemia In setting of CKD in addition to blood loss from wound. Patient has received a total of 2 units of PRBC via transfusion. Hemoglobin stable.  Diabetes mellitus type 2 Diabetes is uncontrolled with hyperglycemia. Most recent hemoglobin A1C of 7.1%. Patient is on insulin glargine and insulin aspart as an outpatient. -Continue Semglee 10 units qHS, Novolog 6 units TID with meals and SSI  Primary hypertension -Continue hydralazine and Coreg  Hyperlipidemia History of CVA -Continue Lipitor  GERD -Continue Pepcid and Protonix  Tobacco use Tobacco cessation discussed during this admission.  History of left BKA Noted.  Debility Patient is not always consistent with HD secondary to subjective reasons. This also affects physical therapy with patient deciding when and how much to participate. Patient voices goals for improvement but inconsistently follows through, and this has contributed to long length of stay. Psychiatry consulted with recommendations for consistent boundaries and consideration of enrollment in [facility] program.   DVT prophylaxis: Heparin subq Code Status:   Code Status: Full Code Family Communication: None at bedside Disposition Plan: Discharge to SNF. Medically stable for discharge. Needs to tolerate HD while in a chair.   Consultants:  Orthopedic surgery Infectious disease Nephrology Vascular surgery Psychiatry  Procedures:  Vascular ultrasound duplex dialysis access 09/07/2021 Fistula revision branch ligation per Dr. Unk Lightning 09/19/2021- Left hip debridement, application of wound VAC by Dr. [name] 08/28/2021  Antimicrobials: Daptomycin Cefazolin   Subjective: No specific concerns this morning. When asked about plan for possible HD in chair, patient became emotional and appeared irritated at me asking the question.  Objective: BP (!) 191/64 (BP Location: Right Arm)   Pulse (!) 58   Temp 98.2 F  (36.8 C) (Oral)   Resp 18   Ht 5\' 10"  (1.778 m)   Wt  98.8 kg   SpO2 99%   BMI 31.25 kg/m   Examination:  General exam: Appears calm and comfortable   Data Reviewed: I have personally reviewed following labs and imaging studies  CBC Lab Results  Component Value Date   WBC 6.5 02/27/2022   RBC 3.37 (L) 02/27/2022   HGB 10.0 (L) 02/27/2022   HCT 30.3 (L) 02/27/2022   MCV 89.9 02/27/2022   MCH 29.7 02/27/2022   PLT 213 02/27/2022   MCHC 33.0 02/27/2022   RDW 13.4 02/27/2022   LYMPHSABS 1.8 02/03/2022   MONOABS 0.9 02/03/2022   EOSABS 0.2 02/03/2022   BASOSABS 0.1 37/85/8850     Last metabolic panel Lab Results  Component Value Date   NA 126 (L) 02/27/2022   K 5.9 (H) 02/27/2022   CL 89 (L) 02/27/2022   CO2 21 (L) 02/27/2022   BUN 125 (H) 02/27/2022   CREATININE 7.72 (H) 02/27/2022   GLUCOSE 124 (H) 02/27/2022   GFRNONAA 6 (L) 02/27/2022   GFRAA 13 (L) 12/17/2018   CALCIUM 8.5 (L) 02/27/2022   PHOS 6.3 (H) 02/27/2022   PROT 7.6 12/02/2021   ALBUMIN 3.7 02/27/2022   LABGLOB 3.1 10/29/2018   AGRATIO 1.4 10/29/2018   BILITOT 0.6 12/02/2021   ALKPHOS 105 12/02/2021   AST 16 12/02/2021   ALT 15 12/02/2021   ANIONGAP 16 (H) 02/27/2022    GFR: Estimated Creatinine Clearance: 10.6 mL/min (A) (by C-G formula based on SCr of 7.72 mg/dL (H)).  No results found for this or any previous visit (from the past 240 hour(s)).    Radiology Studies: No results found.    LOS: 185 days    Cordelia Poche, MD [facility] 02/27/2022, 6:02 PM   If 7PM-7AM, please contact night-coverage [email or web address]

## 2022-02-27 NOTE — Progress Notes (Signed)
Patient presented to HD unit for dialysis off schedule after refusing HD X 2 02/26/2022. Venous needle infiltrated, was unable to successfully re-cannulate AVF. Cold packs applied to AVF. Labs drawn- K+ noted to be 5.9. Ordered Lokelma 10 grams PO now. Repeat renal function panel in AM. Should have HD on regular MWF schedule 02/28/2022.  Marland Kitchen [name] [facility] [phone]

## 2022-02-27 NOTE — Progress Notes (Signed)
Mobility Specialist Progress Note:    02/27/22 1400  Mobility  Activity  (Stretches and Exercises)  Range of Motion/Exercises Left leg;Passive;Active  Activity Response Tolerated well  Mobility Referral Yes  $Mobility charge 1 Mobility    Pt was agreeable to stretches and exercises for LLE. Left pt in bed with all needs met.   Royetta Crochet Mobility Specialist Please contact via Solicitor or  Rehab office at [phone]

## 2022-02-28 DIAGNOSIS — N186 End stage renal disease: Secondary | ICD-10-CM | POA: Diagnosis not present

## 2022-02-28 DIAGNOSIS — Z8673 Personal history of transient ischemic attack (TIA), and cerebral infarction without residual deficits: Secondary | ICD-10-CM | POA: Diagnosis not present

## 2022-02-28 DIAGNOSIS — E1169 Type 2 diabetes mellitus with other specified complication: Secondary | ICD-10-CM | POA: Diagnosis not present

## 2022-02-28 DIAGNOSIS — M86252 Subacute osteomyelitis, left femur: Secondary | ICD-10-CM | POA: Diagnosis not present

## 2022-02-28 LAB — RENAL FUNCTION PANEL
Albumin: 3.5 g/dL (ref 3.5–5.0)
Anion gap: 14 (ref 5–15)
BUN: 129 mg/dL — ABNORMAL HIGH (ref 6–20)
CO2: 22 mmol/L (ref 22–32)
Calcium: 8.4 mg/dL — ABNORMAL LOW (ref 8.9–10.3)
Chloride: 90 mmol/L — ABNORMAL LOW (ref 98–111)
Creatinine, Ser: 8 mg/dL — ABNORMAL HIGH (ref 0.44–1.00)
GFR, Estimated: 6 mL/min — ABNORMAL LOW (ref >60)
Glucose, Bld: 159 mg/dL — ABNORMAL HIGH (ref 70–99)
Phosphorus: 6.9 mg/dL — ABNORMAL HIGH (ref 2.5–4.6)
Potassium: 5.4 mmol/L — ABNORMAL HIGH (ref 3.5–5.1)
Sodium: 126 mmol/L — ABNORMAL LOW (ref 135–145)

## 2022-02-28 LAB — CBC
HCT: 27.6 % — ABNORMAL LOW (ref 36.0–46.0)
Hemoglobin: 9.3 g/dL — ABNORMAL LOW (ref 12.0–15.0)
MCH: 29.7 pg (ref 26.0–34.0)
MCHC: 33.7 g/dL (ref 30.0–36.0)
MCV: 88.2 fL (ref 80.0–100.0)
Platelets: 183 10*3/uL (ref 150–400)
RBC: 3.13 MIL/uL — ABNORMAL LOW (ref 3.87–5.11)
RDW: 13.2 % (ref 11.5–15.5)
WBC: 7 10*3/uL (ref 4.0–10.5)
nRBC: 0 % (ref 0.0–0.2)

## 2022-02-28 LAB — GLUCOSE, CAPILLARY
Glucose-Capillary: 134 mg/dL — ABNORMAL HIGH (ref 70–99)
Glucose-Capillary: 154 mg/dL — ABNORMAL HIGH (ref 70–99)
Glucose-Capillary: 205 mg/dL — ABNORMAL HIGH (ref 70–99)
Glucose-Capillary: 151 mg/dL — ABNORMAL HIGH (ref 70–99)

## 2022-02-28 MED ORDER — HEPARIN SODIUM (PORCINE) 1000 UNIT/ML IJ SOLN
INTRAMUSCULAR | Status: AC
  Administered 2022-02-28: 4000 [IU]
  Filled 2022-02-28: qty 4

## 2022-02-28 MED ORDER — HEPARIN SODIUM (PORCINE) 1000 UNIT/ML IJ SOLN
INTRAMUSCULAR | Status: AC
  Filled 2022-02-28: qty 2

## 2022-02-28 MED ORDER — HEPARIN SODIUM (PORCINE) 1000 UNIT/ML IJ SOLN
INTRAMUSCULAR | Status: AC
  Filled 2022-02-28: qty 4

## 2022-02-28 MED ORDER — DARBEPOETIN ALFA 60 MCG/0.3ML IJ SOSY
60.0000 ug | PREFILLED_SYRINGE | INTRAMUSCULAR | Status: DC
  Administered 2022-02-28 – 2022-03-14 (×3): 60 ug via SUBCUTANEOUS
  Filled 2022-02-28 (×4): qty 0.3

## 2022-02-28 NOTE — Progress Notes (Signed)
Received patient before the start of the treatment.Awake ,alert and oriented x 4.Denies pain.Vitals stable.  Access used :Left upper arm fistula that works well  during treatment..Swelling noted from the previous infiltration.  Duration of treatment : 3 hours as ordered.  Medicines given :Heparin 4,000 units bolus at the start of treatment and then 2,000 units  mid treatment dose.  Fluid removed. 4 liters.  Hemodialysis treatment issue : None.  Hands off to the patient's nurse.

## 2022-02-28 NOTE — Progress Notes (Signed)
Pt refused a bath this am wanted to only use her wipes only on her to clean up urine.

## 2022-02-28 NOTE — Progress Notes (Signed)
PT Cancellation Note  Patient Details Name: [name] MRN: [id number] DOB: 1965-05-22   Cancelled Treatment:    Reason Eval/Treat Not Completed: Patient declined, no reason specified  States she had dialysis this morning and does not feel up to physical therapy session today. Requests we follow-up on another day. Will continue to follow and progress as tolerated.  Candie Mile, PT, DPT Physical Therapist [facility]  02/28/2022, 3:22 PM

## 2022-02-28 NOTE — Progress Notes (Signed)
Pt refused labs this am.

## 2022-02-28 NOTE — Progress Notes (Signed)
[facility] Progress Note   Subjective:   Seen on HD, tolerating well. No trouble cannulating this AM. Says she is still having headaches off and on, noted BP is high as well. Denies SOB, CP, nausea and dizziness.   Objective Vitals:   02/27/22 1723 02/27/22 2059 02/28/22 0829 02/28/22 0851  BP: (!) 191/64 (!) 180/57 (!) 168/64 (!) 176/67  Pulse: (!) 58 61 (!) 59 62  Resp: 18 18 16 16   Temp: 98.2 F (36.8 C) 97.8 F (36.6 C) 97.8 F (36.6 C)   TempSrc: Oral Oral Oral   SpO2: 99%  98% 98%  Weight:   101.9 kg   Height:       Physical Exam General: [name] Heart: RRR, no murmurs, rubs or gallops Lungs: CTA bilaterally Abdomen: Soft, non-distended, +BS  Extremities 1+ pitting edema bilateral lower extremities Dialysis Access: RUE AVF  accessed  Additional Objective Labs: Basic Metabolic Panel: Recent Labs  Lab 02/21/22 2003 02/27/22 1530 02/28/22 0845  NA 132* 126* 126*  K 3.5 5.9* 5.4*  CL 94* 89* 90*  CO2 26 21* 22  GLUCOSE 227* 124* 159*  BUN 38* 125* 129*  CREATININE 3.49* 7.72* 8.00*  CALCIUM 8.7* 8.5* 8.4*  PHOS  --  6.3* 6.9*   Liver Function Tests: Recent Labs  Lab 02/27/22 1530 02/28/22 0845  ALBUMIN 3.7 3.5   No results for input(s): "LIPASE", "AMYLASE" in the last 168 hours. CBC: Recent Labs  Lab 02/21/22 2003 02/27/22 1606 02/28/22 0845  WBC 7.0 6.5 7.0  HGB 11.7* 10.0* 9.3*  HCT 35.2* 30.3* 27.6*  MCV 88.9 89.9 88.2  PLT 213 213 183   Blood Culture    Component Value Date/Time   SDES TISSUE 08/28/2021 0917   SPECREQUEST LEFT THIGH TISSUE 08/28/2021 0917   CULT  08/28/2021 0917    MODERATE ENTEROCOCCUS FAECIUM VANCOMYCIN RESISTANT ENTEROCOCCUS ISOLATED NO ANAEROBES ISOLATED Sent to [facility] for further susceptibility testing. Performed at [facility], [facility] [address], [place], [place] [address]    REPTSTATUS 09/02/2021 FINAL 08/28/2021 3976    Cardiac Enzymes: No results for input(s):  "CKTOTAL", "CKMB", "CKMBINDEX", "TROPONINI" in the last 168 hours. CBG: Recent Labs  Lab 02/27/22 0729 02/27/22 1130 02/27/22 1720 02/27/22 2059 02/28/22 0723  GLUCAP 174* 151* 123* 249* 134*   Iron Studies: No results for input(s): "IRON", "TIBC", "TRANSFERRIN", "FERRITIN" in the last 72 hours. @lablastinr3 @ Studies/Results: No results found. Medications:  albumin human      (feeding supplement) PROSource Plus  30 mL Oral BID BM   alosetron  1 mg Oral BID   amitriptyline  50 mg Oral QHS   atorvastatin  40 mg Oral q1800   calcitRIOL  0.25 mcg Oral Daily   carvedilol  6.25 mg Oral BID   docusate sodium  200 mg Oral BID   fentaNYL  1 patch Transdermal Q72H   furosemide  80 mg Oral Daily   heparin  4,000 Units Dialysis Once in dialysis   heparin  4,000 Units Dialysis Once in dialysis   heparin sodium (porcine)       heparin sodium (porcine)  2,000 Units Intravenous Once in dialysis   heparin sodium (porcine)  2,000 Units Intravenous Once in dialysis   hydrALAZINE  50 mg Oral TID   insulin aspart  0-9 Units Subcutaneous TID WC   insulin aspart  6 Units Subcutaneous TID WC   insulin glargine-yfgn  10 Units Subcutaneous QHS   methocarbamol  500 mg  Oral QID   mirtazapine  7.5 mg Oral QHS   multivitamin  1 tablet Oral QHS   pantoprazole  40 mg Oral Daily   sevelamer carbonate  2,400 mg Oral TID WC   sodium zirconium cyclosilicate  10 g Oral Once per day on Sun Tue Thu Sat    Dialysis Orders: MWF at [facility], 400/500, EDW 90kg, 2K/2Ca, LUE AVF - No heparin prior to admission, no ESA  Assessment/Plan: 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 Completed 6 wk course of IV Daptomycin on 10/12/21. Per Dr. Jess Barters note 12/28/2021 "NO RESTRICTIONS L HIP".  Chronic L hip/residual limb pain: Hx fractures of L femoral neck (where hardware was removed) as well as distal femur Fx. Pain control per primary.  ESRD: Usual MWF schedule.  Refused HD 02/26/2022. Infiltrated AVF yesterday but is working well today, is back on regular MWF schedule. Clots HD system without heparin, 4000 unit IV bolus and Heparin 2000 units IV mid-run bolus TIW.  Hyperkalemia: Stable on scheduled lokelma 4 days a week. HTN/volume: BP meds have been tapered down. Large fluids gains between HD which has been discussed extensively, Ongoing edema bilateral lower extremity, Na low and BP high, all pointing to excess volume. Last full HD was 12/22. Max UF with HD today.  Anemia of ESRD: Hgb 9.3.  Last tsat 54%.Will resume aranesp  Secondary HPTH: CorrCa ok, no VDRA. Phos 6.2, continue Renvela as binder. Needs diet compliance  Nutrition: Alb 3.7, continue protein supplements.  Renal diet w/fluid restrictions.   AVF dysfunction (resolved): S/p branch ligation 09/19/2021  Dr. Virl Cagey. Infiltration earlier this week, see above.  DM2: Insulin per primary. BS have been stable.  Dispo: SNF previously recommended, but she declined. Not home HD candidate.  Dialyzed in recliner on 9/4 but signed off early due to pain, has declined to come in a recliner since then.  Strongly discussed on the importance of increasing her mobility as much as possible while she is in her room in addition to following her ongoing fluid restriction. Also reinforced the importance of being in the chair for HD treatments to improve strength and mobility- declines again today. Does not like to do PT after HD, tried to do HD in the afternoons so she can do PT in the AM but she refuses PT on HD days. Per Renal Navigator notes there are no clinics that will accept patient unless she will have HD in chair. Will continue to encourage use of chair for HD. Ultimately patient is making her own choices that are prolonging hospitalization.     Anice Paganini, PA-C 02/28/2022, 9:51 AM  [facility] Pager: [phone]

## 2022-02-28 NOTE — Progress Notes (Signed)
OT Cancellation Note  Patient Details Name: [name] MRN: [id number] DOB: August 07, 1965   Cancelled Treatment:    Reason Eval/Treat Not Completed: Patient at procedure or test/ unavailable (in HD.will follow up next week)  [facility] 02/28/2022, 8:54 AM Maurie Boettcher, OT/L   Acute OT Clinical Specialist Acute Rehabilitation Services Pager [phone] Office [phone]

## 2022-02-28 NOTE — Progress Notes (Signed)
PROGRESS NOTE    [name]  NKN:[id number] DOB: Jan 24, 1966 DOA: 08/26/2021 PCP: [name], MD   Brief Narrative: This 56 year old female with PMH significant of hypertension, hyperlipidemia, diabetes mellitus type 2, end-stage renal disease on hemodialysis, status post left BKA for chronic calcaneus osteomyelitis on 01/12/2020, bipolar disorder, left distal femoral fracture after a fall on 07/19/2021 deemed  nonoperative, admission from 08/03/2021-08/23/2021 for left femoral osteomyelitis requiring partial resection of femur and rearrangements of bony fragments with subsequent antibiotic treatment presented to the hospital with left hip bleeding and was found to have subacute osteomyelitis and abscess of left femur.  Orthopedic/ID were consulted and patient was started on broad-spectrum antibiotics.  Patient underwent multiple I&D's by Dr. [name] along with local tissue rearrangement.  Patient was also seen by vascular surgery who did fistula branch ligation.  Patient has completed 6-week course of daptomycin on 10/09/2021.  She has been unable to complete hemodialysis in chair and outpatient hemodialysis unit has been hard to find. She is otherwise medically stable for discharge.      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 Orthopedic surgery consulted. ID consulted. Patient underwent hardware removal on 6/10 (prior to admission) with I&D on 6/28 and 7/1. Infectious disease recommendations for 6 weeks of Daptomycin; patient has completed course. Stable.  History of left intertrochanteric fracture This was previously managed with IM nail placement in 2021. Hardware removed secondary to above.  ESRD on HD Nephrology on board.  -HD per nephrology  Anasarca Improved with hemodialysis.  Hyponatremia Management with hemodialysis.  Hypokalemia Hyperkalemia Management with  hemodialysis  Anemia of chronic disease Acute blood loss anemia In setting of CKD in addition to blood loss from wound. Patient has received a total of 2 units of PRBC via transfusion. Hemoglobin stable.  Diabetes mellitus type 2 Diabetes is uncontrolled with hyperglycemia. Most recent hemoglobin A1C of 7.1%. Patient is on insulin glargine and insulin aspart as an outpatient. -Continue Semglee 10 units qHS, Novolog 6 units TID with meals and SSI  Primary hypertension -Continue hydralazine and Coreg  Hyperlipidemia History of CVA -Continue Lipitor  GERD -Continue Pepcid and Protonix  Tobacco use Tobacco cessation discussed during this admission.  History of left BKA Noted.  Debility Patient is not always consistent with HD secondary to subjective reasons. This also affects physical therapy with patient deciding when and how much to participate. Patient voices goals for improvement but inconsistently follows through, and this has contributed to long length of stay. Psychiatry consulted with recommendations for consistent boundaries and consideration of enrollment in [facility] program.   DVT prophylaxis: Heparin subq Code Status:   Code Status: Full Code Family Communication: None at bedside Disposition Plan: Discharge to SNF. Medically stable for discharge. Needs to tolerate HD while in a chair.   Consultants:  Orthopedic surgery Infectious disease Nephrology Vascular surgery Psychiatry  Procedures:  Vascular ultrasound duplex dialysis access 09/07/2021 Fistula revision branch ligation per Dr. Unk Lightning 09/19/2021- Left hip debridement, application of wound VAC by Dr. [name] 08/28/2021  Antimicrobials: Daptomycin Cefazolin   Subjective: No issues noted overnight. In hemodialysis laying on bed; still not receiving hemodialysis in chair.  Objective: BP (!) 148/64 (BP Location: Right Arm)   Pulse 67   Temp 98.2 F (36.8 C) (Oral)   Resp 16   Ht 5\' 10"  (1.778 m)   Wt 97.9  kg   SpO2 98%   BMI 30.97 kg/m  Examination:  General exam: Appears calm and comfortable.  Data Reviewed: I have personally reviewed following labs and imaging studies  CBC Lab Results  Component Value Date   WBC 7.0 02/28/2022   RBC 3.13 (L) 02/28/2022   HGB 9.3 (L) 02/28/2022   HCT 27.6 (L) 02/28/2022   MCV 88.2 02/28/2022   MCH 29.7 02/28/2022   PLT 183 02/28/2022   MCHC 33.7 02/28/2022   RDW 13.2 02/28/2022   LYMPHSABS 1.8 02/03/2022   MONOABS 0.9 02/03/2022   EOSABS 0.2 02/03/2022   BASOSABS 0.1 01/75/1025     Last metabolic panel Lab Results  Component Value Date   NA 126 (L) 02/28/2022   K 5.4 (H) 02/28/2022   CL 90 (L) 02/28/2022   CO2 22 02/28/2022   BUN 129 (H) 02/28/2022   CREATININE 8.00 (H) 02/28/2022   GLUCOSE 159 (H) 02/28/2022   GFRNONAA 6 (L) 02/28/2022   GFRAA 13 (L) 12/17/2018   CALCIUM 8.4 (L) 02/28/2022   PHOS 6.9 (H) 02/28/2022   PROT 7.6 12/02/2021   ALBUMIN 3.5 02/28/2022   LABGLOB 3.1 10/29/2018   AGRATIO 1.4 10/29/2018   BILITOT 0.6 12/02/2021   ALKPHOS 105 12/02/2021   AST 16 12/02/2021   ALT 15 12/02/2021   ANIONGAP 14 02/28/2022    GFR: Estimated Creatinine Clearance: 10.2 mL/min (A) (by C-G formula based on SCr of 8 mg/dL (H)).  No results found for this or any previous visit (from the past 240 hour(s)).    Radiology Studies: No results found.    LOS: 186 days    Cordelia Poche, MD [facility] 02/28/2022, 1:00 PM   If 7PM-7AM, please contact night-coverage [email or web address]

## 2022-03-01 DIAGNOSIS — E1169 Type 2 diabetes mellitus with other specified complication: Secondary | ICD-10-CM | POA: Diagnosis not present

## 2022-03-01 DIAGNOSIS — N186 End stage renal disease: Secondary | ICD-10-CM | POA: Diagnosis not present

## 2022-03-01 DIAGNOSIS — M86252 Subacute osteomyelitis, left femur: Secondary | ICD-10-CM | POA: Diagnosis not present

## 2022-03-01 DIAGNOSIS — Z8673 Personal history of transient ischemic attack (TIA), and cerebral infarction without residual deficits: Secondary | ICD-10-CM | POA: Diagnosis not present

## 2022-03-01 LAB — GLUCOSE, CAPILLARY
Glucose-Capillary: 138 mg/dL — ABNORMAL HIGH (ref 70–99)
Glucose-Capillary: 151 mg/dL — ABNORMAL HIGH (ref 70–99)
Glucose-Capillary: 165 mg/dL — ABNORMAL HIGH (ref 70–99)
Glucose-Capillary: 178 mg/dL — ABNORMAL HIGH (ref 70–99)

## 2022-03-01 MED ORDER — TOPIRAMATE 25 MG PO TABS
12.5000 mg | ORAL_TABLET | Freq: Every day | ORAL | Status: DC
  Administered 2022-03-01 – 2022-03-18 (×17): 12.5 mg via ORAL
  Filled 2022-03-01 (×18): qty 0.5

## 2022-03-01 MED ORDER — CHLORHEXIDINE GLUCONATE CLOTH 2 % EX PADS: 6.0000 | MEDICATED_PAD | Freq: Every day | CUTANEOUS | Status: DC

## 2022-03-01 NOTE — Progress Notes (Signed)
PROGRESS NOTE    [name]  TDV:[id number] DOB: Apr 15, 1965 DOA: 08/26/2021 PCP: [name], MD   Brief Narrative: This 56 year old female with PMH significant of hypertension, hyperlipidemia, diabetes mellitus type 2, end-stage renal disease on hemodialysis, status post left BKA for chronic calcaneus osteomyelitis on 01/12/2020, bipolar disorder, left distal femoral fracture after a fall on 07/19/2021 deemed  nonoperative, admission from 08/03/2021-08/23/2021 for left femoral osteomyelitis requiring partial resection of femur and rearrangements of bony fragments with subsequent antibiotic treatment presented to the hospital with left hip bleeding and was found to have subacute osteomyelitis and abscess of left femur.  Orthopedic/ID were consulted and patient was started on broad-spectrum antibiotics.  Patient underwent multiple I&D's by Dr. [name] along with local tissue rearrangement.  Patient was also seen by vascular surgery who did fistula branch ligation.  Patient has completed 6-week course of daptomycin on 10/09/2021.  She has been unable to complete hemodialysis in chair and outpatient hemodialysis unit has been hard to find. She is otherwise medically stable for discharge.      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 Orthopedic surgery consulted. ID consulted. Patient underwent hardware removal on 6/10 (prior to admission) with I&D on 6/28 and 7/1. Infectious disease recommendations for 6 weeks of Daptomycin; patient has completed course. Stable.  History of left intertrochanteric fracture This was previously managed with IM nail placement in 2021. Hardware removed secondary to above.  ESRD on HD Nephrology on board.  -HD per nephrology  Anasarca Improved with hemodialysis.  Hyponatremia Management with hemodialysis.  Hypokalemia Hyperkalemia Management with  hemodialysis  Anemia of chronic disease Acute blood loss anemia In setting of CKD in addition to blood loss from wound. Patient has received a total of 2 units of PRBC via transfusion. Hemoglobin stable.  Diabetes mellitus type 2 Diabetes is uncontrolled with hyperglycemia. Most recent hemoglobin A1C of 7.1%. Patient is on insulin glargine and insulin aspart as an outpatient. -Continue Semglee 10 units qHS, Novolog 6 units TID with meals and SSI  Migraine headaches Patient previously on Topomax for prophylaxis. Patient with frequently recurrent migraine headaches this admission. -Start Topamax 12.5 mg daily (renally dosed) and titrate as needed/safe  Primary hypertension -Continue hydralazine and Coreg  Hyperlipidemia History of CVA -Continue Lipitor  GERD -Continue Pepcid and Protonix  Tobacco use Tobacco cessation discussed during this admission.  History of left BKA Noted.  Debility Patient is not always consistent with HD secondary to subjective reasons. This also affects physical therapy with patient deciding when and how much to participate. Patient voices goals for improvement but inconsistently follows through, and this has contributed to long length of stay. Psychiatry consulted with recommendations for consistent boundaries and consideration of enrollment in [facility] program.   DVT prophylaxis: Heparin subq Code Status:   Code Status: Full Code Family Communication: None at bedside Disposition Plan: Discharge to SNF. Medically stable for discharge. Needs to tolerate HD while in a chair.   Consultants:  Orthopedic surgery Infectious disease Nephrology Vascular surgery Psychiatry  Procedures:  Vascular ultrasound duplex dialysis access 09/07/2021 Fistula revision branch ligation per Dr. Unk Lightning 09/19/2021- Left hip debridement, application of wound VAC by Dr. [name] 08/28/2021  Antimicrobials: Daptomycin Cefazolin   Subjective: Patient reports no issues this  morning. She asks about possibly restarting migraine prophylaxis. She was previously on Topamax and weaned herself off.  Objective: BP (!) 161/98 (BP Location: Right Arm)  Pulse 75   Temp 98.8 F (37.1 C) (Oral)   Resp 18   Ht 5\' 10"  (1.778 m)   Wt 97.9 kg   SpO2 98%   BMI 30.97 kg/m   Examination:  General exam: Appears calm and comfortable. Up sitting in bed, making coffee. Respiratory system: Respiratory effort normal. Central nervous system: Alert and oriented. No focal neurological deficits. Psychiatry: Judgement and insight appear normal. Pleasant mood. Full affect.  Data Reviewed: I have personally reviewed following labs and imaging studies  CBC Lab Results  Component Value Date   WBC 7.0 02/28/2022   RBC 3.13 (L) 02/28/2022   HGB 9.3 (L) 02/28/2022   HCT 27.6 (L) 02/28/2022   MCV 88.2 02/28/2022   MCH 29.7 02/28/2022   PLT 183 02/28/2022   MCHC 33.7 02/28/2022   RDW 13.2 02/28/2022   LYMPHSABS 1.8 02/03/2022   MONOABS 0.9 02/03/2022   EOSABS 0.2 02/03/2022   BASOSABS 0.1 16/60/6004     Last metabolic panel Lab Results  Component Value Date   NA 126 (L) 02/28/2022   K 5.4 (H) 02/28/2022   CL 90 (L) 02/28/2022   CO2 22 02/28/2022   BUN 129 (H) 02/28/2022   CREATININE 8.00 (H) 02/28/2022   GLUCOSE 159 (H) 02/28/2022   GFRNONAA 6 (L) 02/28/2022   GFRAA 13 (L) 12/17/2018   CALCIUM 8.4 (L) 02/28/2022   PHOS 6.9 (H) 02/28/2022   PROT 7.6 12/02/2021   ALBUMIN 3.5 02/28/2022   LABGLOB 3.1 10/29/2018   AGRATIO 1.4 10/29/2018   BILITOT 0.6 12/02/2021   ALKPHOS 105 12/02/2021   AST 16 12/02/2021   ALT 15 12/02/2021   ANIONGAP 14 02/28/2022    GFR: Estimated Creatinine Clearance: 10.2 mL/min (A) (by C-G formula based on SCr of 8 mg/dL (H)).  No results found for this or any previous visit (from the past 240 hour(s)).    Radiology Studies: No results found.    LOS: 187 days    Cordelia Poche, MD [facility] 03/01/2022, 8:29 AM   If  7PM-7AM, please contact night-coverage [email or web address]

## 2022-03-01 NOTE — Plan of Care (Signed)
  Problem: Pain Managment: Goal: General experience of comfort will improve Outcome: Progressing   Problem: Nutrition: Goal: Adequate nutrition will be maintained Outcome: Progressing   Problem: Elimination: Goal: Will not experience complications related to bowel motility Outcome: Progressing

## 2022-03-01 NOTE — Progress Notes (Signed)
[facility]  Patient will not be seen by nephrology team today. Please page with any concerns. Next HD will be Sunday, 03/02/22 due to holiday schedule. HD orders have been placed.  Herold Salguero, PA-C 03/01/2022, 12:31 PM  [facility] Pager: [phone] 

## 2022-03-02 DIAGNOSIS — M86252 Subacute osteomyelitis, left femur: Secondary | ICD-10-CM | POA: Diagnosis not present

## 2022-03-02 DIAGNOSIS — Z8673 Personal history of transient ischemic attack (TIA), and cerebral infarction without residual deficits: Secondary | ICD-10-CM | POA: Diagnosis not present

## 2022-03-02 DIAGNOSIS — N186 End stage renal disease: Secondary | ICD-10-CM | POA: Diagnosis not present

## 2022-03-02 DIAGNOSIS — E1169 Type 2 diabetes mellitus with other specified complication: Secondary | ICD-10-CM | POA: Diagnosis not present

## 2022-03-02 LAB — CBC
HCT: 28.7 % — ABNORMAL LOW (ref 36.0–46.0)
Hemoglobin: 9.5 g/dL — ABNORMAL LOW (ref 12.0–15.0)
MCH: 29.5 pg (ref 26.0–34.0)
MCHC: 33.1 g/dL (ref 30.0–36.0)
MCV: 89.1 fL (ref 80.0–100.0)
Platelets: 191 10*3/uL (ref 150–400)
RBC: 3.22 MIL/uL — ABNORMAL LOW (ref 3.87–5.11)
RDW: 13.2 % (ref 11.5–15.5)
WBC: 6.2 10*3/uL (ref 4.0–10.5)
nRBC: 0 % (ref 0.0–0.2)

## 2022-03-02 LAB — RENAL FUNCTION PANEL
Albumin: 3.5 g/dL (ref 3.5–5.0)
Anion gap: 12 (ref 5–15)
BUN: 96 mg/dL — ABNORMAL HIGH (ref 6–20)
CO2: 23 mmol/L (ref 22–32)
Calcium: 8.4 mg/dL — ABNORMAL LOW (ref 8.9–10.3)
Chloride: 93 mmol/L — ABNORMAL LOW (ref 98–111)
Creatinine, Ser: 6.5 mg/dL — ABNORMAL HIGH (ref 0.44–1.00)
GFR, Estimated: 7 mL/min — ABNORMAL LOW (ref >60)
Glucose, Bld: 181 mg/dL — ABNORMAL HIGH (ref 70–99)
Phosphorus: 6.5 mg/dL — ABNORMAL HIGH (ref 2.5–4.6)
Potassium: 4.8 mmol/L (ref 3.5–5.1)
Sodium: 128 mmol/L — ABNORMAL LOW (ref 135–145)

## 2022-03-02 LAB — GLUCOSE, CAPILLARY
Glucose-Capillary: 164 mg/dL — ABNORMAL HIGH (ref 70–99)
Glucose-Capillary: 179 mg/dL — ABNORMAL HIGH (ref 70–99)
Glucose-Capillary: 175 mg/dL — ABNORMAL HIGH (ref 70–99)

## 2022-03-02 MED ORDER — HEPARIN SODIUM (PORCINE) 1000 UNIT/ML IJ SOLN
INTRAMUSCULAR | Status: AC
  Administered 2022-03-02: 4000 [IU]
  Filled 2022-03-02: qty 4

## 2022-03-02 NOTE — Progress Notes (Signed)
PROGRESS NOTE    [name]  OZH:[id number] DOB: 08/26/1965 DOA: 08/26/2021 PCP: [name], MD   Brief Narrative: This 56 year old female with PMH significant of hypertension, hyperlipidemia, diabetes mellitus type 2, end-stage renal disease on hemodialysis, status post left BKA for chronic calcaneus osteomyelitis on 01/12/2020, bipolar disorder, left distal femoral fracture after a fall on 07/19/2021 deemed  nonoperative, admission from 08/03/2021-08/23/2021 for left femoral osteomyelitis requiring partial resection of femur and rearrangements of bony fragments with subsequent antibiotic treatment presented to the hospital with left hip bleeding and was found to have subacute osteomyelitis and abscess of left femur.  Orthopedic/ID were consulted and patient was started on broad-spectrum antibiotics.  Patient underwent multiple I&D's by Dr. [name] along with local tissue rearrangement.  Patient was also seen by vascular surgery who did fistula branch ligation.  Patient has completed 6-week course of daptomycin on 10/09/2021.  She has been unable to complete hemodialysis in chair and outpatient hemodialysis unit has been hard to find. She is otherwise medically stable for discharge.      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 Orthopedic surgery consulted. ID consulted. Patient underwent hardware removal on 6/10 (prior to admission) with I&D on 6/28 and 7/1. Infectious disease recommendations for 6 weeks of Daptomycin; patient has completed course. Stable.  History of left intertrochanteric fracture This was previously managed with IM nail placement in 2021. Hardware removed secondary to above.  ESRD on HD Nephrology on board.  -HD per nephrology  Anasarca Improved with hemodialysis.  Hyponatremia Management with hemodialysis.  Hypokalemia Hyperkalemia Management with  hemodialysis  Anemia of chronic disease Acute blood loss anemia In setting of CKD in addition to blood loss from wound. Patient has received a total of 2 units of PRBC via transfusion. Hemoglobin stable.  Diabetes mellitus type 2 Diabetes is uncontrolled with hyperglycemia. Most recent hemoglobin A1C of 7.1%. Patient is on insulin glargine and insulin aspart as an outpatient. -Continue Semglee 10 units qHS, Novolog 6 units TID with meals and SSI  Migraine headaches Patient previously on Topomax for prophylaxis. Patient with frequently recurrent migraine headaches this admission. Patient feels that her headaches may be improved since starting Topamax. -Continue Topamax 12.5 mg daily (renally dosed) and titrate as needed/safe  Primary hypertension -Continue hydralazine and Coreg  Hyperlipidemia History of CVA -Continue Lipitor  GERD -Continue Pepcid and Protonix  Tobacco use Tobacco cessation discussed during this admission.  History of left BKA Noted.  Debility Patient is not always consistent with HD secondary to subjective reasons. This also affects physical therapy with patient deciding when and how much to participate. Patient voices goals for improvement but inconsistently follows through, and this has contributed to long length of stay. Psychiatry consulted with recommendations for consistent boundaries and consideration of enrollment in [facility] program.   DVT prophylaxis: Heparin subq Code Status:   Code Status: Full Code Family Communication: None at bedside Disposition Plan: Discharge to SNF. Medically stable for discharge. Needs to tolerate HD while in a chair.   Consultants:  Orthopedic surgery Infectious disease Nephrology Vascular surgery Psychiatry  Procedures:  Vascular ultrasound duplex dialysis access 09/07/2021 Fistula revision branch ligation per Dr. Unk Lightning 09/19/2021- Left hip debridement, application of wound VAC by Dr. [name]  08/28/2021  Antimicrobials: Daptomycin Cefazolin   Subjective: No issues this morning. Currently in HD unit receiving HD while in bed.  Objective: BP (!) 157/65 (BP Location: Right Arm)  Pulse 69   Temp 98.3 F (36.8 C) (Oral)   Resp 12   Ht 5\' 10"  (1.778 m)   Wt 100 kg   SpO2 98%   BMI 31.63 kg/m   Examination:  General exam: Appears calm and comfortable Respiratory system: Respiratory effort normal. Central nervous system: Alert and oriented. Psychiatry: Judgement and insight appear normal. Mood & affect appropriate.   Data Reviewed: I have personally reviewed following labs and imaging studies  CBC Lab Results  Component Value Date   WBC 6.2 03/02/2022   RBC 3.22 (L) 03/02/2022   HGB 9.5 (L) 03/02/2022   HCT 28.7 (L) 03/02/2022   MCV 89.1 03/02/2022   MCH 29.5 03/02/2022   PLT 191 03/02/2022   MCHC 33.1 03/02/2022   RDW 13.2 03/02/2022   LYMPHSABS 1.8 02/03/2022   MONOABS 0.9 02/03/2022   EOSABS 0.2 02/03/2022   BASOSABS 0.1 38/25/0539     Last metabolic panel Lab Results  Component Value Date   NA 128 (L) 03/02/2022   K 4.8 03/02/2022   CL 93 (L) 03/02/2022   CO2 23 03/02/2022   BUN 96 (H) 03/02/2022   CREATININE 6.50 (H) 03/02/2022   GLUCOSE 181 (H) 03/02/2022   GFRNONAA 7 (L) 03/02/2022   GFRAA 13 (L) 12/17/2018   CALCIUM 8.4 (L) 03/02/2022   PHOS 6.5 (H) 03/02/2022   PROT 7.6 12/02/2021   ALBUMIN 3.5 03/02/2022   LABGLOB 3.1 10/29/2018   AGRATIO 1.4 10/29/2018   BILITOT 0.6 12/02/2021   ALKPHOS 105 12/02/2021   AST 16 12/02/2021   ALT 15 12/02/2021   ANIONGAP 12 03/02/2022    GFR: Estimated Creatinine Clearance: 12.7 mL/min (A) (by C-G formula based on SCr of 6.5 mg/dL (H)).  No results found for this or any previous visit (from the past 240 hour(s)).    Radiology Studies: No results found.    LOS: 188 days    Cordelia Poche, MD [facility] 03/02/2022, 12:18 PM   If 7PM-7AM, please contact  night-coverage [email or web address]

## 2022-03-02 NOTE — Plan of Care (Signed)
  Problem: Nutrition: Goal: Adequate nutrition will be maintained Outcome: Progressing   Problem: Elimination: Goal: Will not experience complications related to bowel motility Outcome: Progressing   

## 2022-03-02 NOTE — Progress Notes (Signed)
Received patient in bed to unit.  Alert and oriented.  Informed consent signed and in chart.   Treatment initiated: 09:53 Treatment completed: 12:58  Patient tolerated well.  Transported back to the room  Alert, without acute distress.  Hand-off given to patient's nurse.   Access used: Left AVF Access issues: none  Total UF removed: 4L Medication(s) given: none     03/02/22 1258  Vitals  Temp 98.4 F (36.9 C)  Temp Source Oral  BP (!) 129/45  MAP (mmHg) 67  BP Location Right Arm  BP Method Automatic  Patient Position (if appropriate) Lying  Pulse Rate 70  Pulse Rate Source Monitor  ECG Heart Rate 70  Resp 12  Oxygen Therapy  SpO2 100 %  O2 Device Room Air  During Treatment Monitoring  HD Safety Checks Performed Yes  Intra-Hemodialysis Comments Tolerated well;Tx completed  Dialysis Fluid Bolus Normal Saline  Bolus Amount (mL) 300 mL  Fistula / Graft Left Upper arm Arteriovenous fistula  Placement Date/Time: 01/09/20 1121   Placed prior to admission: No  Orientation: (c) Left  Access Location: Upper arm  Access Type: Arteriovenous fistula  Site Condition No complications  Fistula / Graft Assessment Present;Thrill;Bruit  Status Deaccessed  Drainage Description None  Vitals  Weight 95.9 kg  Height and Weight  Type of Weight Post-Dialysis      Aasiya Creasey S [facility]

## 2022-03-03 DIAGNOSIS — Z8673 Personal history of transient ischemic attack (TIA), and cerebral infarction without residual deficits: Secondary | ICD-10-CM | POA: Diagnosis not present

## 2022-03-03 DIAGNOSIS — N186 End stage renal disease: Secondary | ICD-10-CM | POA: Diagnosis not present

## 2022-03-03 DIAGNOSIS — E1169 Type 2 diabetes mellitus with other specified complication: Secondary | ICD-10-CM | POA: Diagnosis not present

## 2022-03-03 DIAGNOSIS — M86252 Subacute osteomyelitis, left femur: Secondary | ICD-10-CM | POA: Diagnosis not present

## 2022-03-03 LAB — GLUCOSE, CAPILLARY
Glucose-Capillary: 170 mg/dL — ABNORMAL HIGH (ref 70–99)
Glucose-Capillary: 203 mg/dL — ABNORMAL HIGH (ref 70–99)
Glucose-Capillary: 172 mg/dL — ABNORMAL HIGH (ref 70–99)
Glucose-Capillary: 201 mg/dL — ABNORMAL HIGH (ref 70–99)

## 2022-03-03 MED ORDER — DULOXETINE HCL 20 MG PO CPEP
40.0000 mg | ORAL_CAPSULE | Freq: Every day | ORAL | Status: DC
  Administered 2022-03-03 – 2022-03-11 (×9): 40 mg via ORAL
  Filled 2022-03-03 (×9): qty 2

## 2022-03-03 NOTE — Progress Notes (Addendum)
[facility] Progress Note   Subjective:    Seen and examined patient at bedside. Tolerated yesterday HD with net UF 4L. No acute issues today. Noted Bps are elevated. Next HD 03/05/22.  Objective Vitals:   03/02/22 1347 03/02/22 1618 03/02/22 2025 03/03/22 1022  BP: (!) 156/52 (!) 157/63 (!) 166/53 (!) 168/60  Pulse: 68 72 75 66  Resp: 17 18 18 17   Temp: 98.2 F (36.8 C) 98.2 F (36.8 C) 98.4 F (36.9 C) 98.5 F (36.9 C)  TempSrc: Oral Oral Oral   SpO2: 99% 99% 99% 100%  Weight:      Height:       Physical Exam General: [name] Heart: RRR, no murmurs, rubs or gallops Lungs: CTA bilaterally Abdomen: Soft, non-distended, +BS  Extremities 1+ pitting edema bilateral lower extremities Dialysis Access: RUE AVF (+) B/T  Filed Weights   02/28/22 1212 03/02/22 0943 03/02/22 1258  Weight: 97.9 kg 100 kg 95.9 kg    Intake/Output Summary (Last 24 hours) at 03/03/2022 1025 Last data filed at 03/03/2022 0658 Gross per 24 hour  Intake 720 ml  Output 4900 ml  Net -4180 ml    Additional Objective Labs: Basic Metabolic Panel: Recent Labs  Lab 02/27/22 1530 02/28/22 0845 03/02/22 0942  NA 126* 126* 128*  K 5.9* 5.4* 4.8  CL 89* 90* 93*  CO2 21* 22 23  GLUCOSE 124* 159* 181*  BUN 125* 129* 96*  CREATININE 7.72* 8.00* 6.50*  CALCIUM 8.5* 8.4* 8.4*  PHOS 6.3* 6.9* 6.5*   Liver Function Tests: Recent Labs  Lab 02/27/22 1530 02/28/22 0845 03/02/22 0942  ALBUMIN 3.7 3.5 3.5   No results for input(s): "LIPASE", "AMYLASE" in the last 168 hours. CBC: Recent Labs  Lab 02/27/22 1606 02/28/22 0845 03/02/22 0942  WBC 6.5 7.0 6.2  HGB 10.0* 9.3* 9.5*  HCT 30.3* 27.6* 28.7*  MCV 89.9 88.2 89.1  PLT 213 183 191   Blood Culture    Component Value Date/Time   SDES TISSUE 08/28/2021 0917   SPECREQUEST LEFT THIGH TISSUE 08/28/2021 0917   CULT  08/28/2021 0917    MODERATE ENTEROCOCCUS FAECIUM VANCOMYCIN RESISTANT ENTEROCOCCUS ISOLATED NO  ANAEROBES ISOLATED Sent to [place] for further susceptibility testing. Performed at [facility], [place] [address] [place], [place] [address]    REPTSTATUS 09/02/2021 FINAL 08/28/2021 3790    Cardiac Enzymes: No results for input(s): "CKTOTAL", "CKMB", "CKMBINDEX", "TROPONINI" in the last 168 hours. CBG: Recent Labs  Lab 03/01/22 2109 03/02/22 0726 03/02/22 1621 03/02/22 2027 03/03/22 0737  GLUCAP 178* 164* 179* 175* 170*   Iron Studies: No results for input(s): "IRON", "TIBC", "TRANSFERRIN", "FERRITIN" in the last 72 hours. Lab Results  Component Value Date   INR 1.1 12/28/2019   INR 1.2 12/17/2018   INR 0.99 04/07/2017   Studies/Results: No results found.  Medications:  albumin human      (feeding supplement) PROSource Plus  30 mL Oral BID BM   alosetron  1 mg Oral BID   amitriptyline  50 mg Oral QHS   atorvastatin  40 mg Oral q1800   calcitRIOL  0.25 mcg Oral Daily   carvedilol  6.25 mg Oral BID   Chlorhexidine Gluconate Cloth  6 each Topical Q0600   darbepoetin (ARANESP) injection - DIALYSIS  60 mcg Subcutaneous Q Fri-1800   docusate sodium  200 mg Oral BID   fentaNYL  1 patch Transdermal Q72H   furosemide  80 mg Oral Daily  hydrALAZINE  50 mg Oral TID   insulin aspart  0-9 Units Subcutaneous TID WC   insulin aspart  6 Units Subcutaneous TID WC   insulin glargine-yfgn  10 Units Subcutaneous QHS   methocarbamol  500 mg Oral QID   mirtazapine  7.5 mg Oral QHS   multivitamin  1 tablet Oral QHS   pantoprazole  40 mg Oral Daily   sevelamer carbonate  2,400 mg Oral TID WC   sodium zirconium cyclosilicate  10 g Oral Once per day on Sun Tue Thu Sat   topiramate  12.5 mg Oral Daily    Dialysis Orders: MWF at [facility], 400/500, EDW 90kg, 2K/2Ca, LUE AVF - No heparin prior to admission, no ESA  Assessment/Plan: 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 Completed 6 wk course of IV Daptomycin on 10/12/21.  Per Dr. Jess Barters note 12/28/2021 "NO RESTRICTIONS L HIP".  Chronic L hip/residual limb pain: Hx fractures of L femoral neck (where hardware was removed) as well as distal femur Fx. Pain control per primary.  ESRD: Usual MWF schedule. Refused HD 02/26/2022. Previously AVF infiltrated but is working well as of now, is back on regular MWF schedule. Clots HD system without heparin, 4000 unit IV bolus and Heparin 2000 units IV mid-run bolus TIW.  Hyperkalemia: Stable on scheduled lokelma 4 days a week. HTN/volume: BP meds have been tapered down. Large fluids gains between HD which has been discussed extensively, Ongoing edema bilateral lower extremity, Na low and BP high, all pointing to excess volume. Last full HD was 12/22. Continue to use max UF with HD.  Anemia of ESRD: Last tsat 54%. Aranesp recently resumed. Hgb now 9.5. Secondary HPTH: CorrCa ok, no VDRA. Phos 6.2, continue Renvela as binder. Needs diet compliance  Nutrition: Alb 3.7, continue protein supplements.  Renal diet w/fluid restrictions.   AVF dysfunction (resolved): S/p branch ligation 09/19/2021  Dr. Virl Cagey. Infiltration earlier this week, see above.  DM2: Insulin per primary. BS have been stable.  Dispo: SNF previously recommended, but she declined. Not home HD candidate.  Dialyzed in recliner on 9/4 but signed off early due to pain, has declined to come in a recliner since then.  Strongly discussed on the importance of increasing her mobility as much as possible while she is in her room in addition to following her ongoing fluid restriction. Also reinforced the importance of being in the chair for HD treatments to improve strength and mobility- declines again today. Does not like to do PT after HD, tried to do HD in the afternoons so she can do PT in the AM but she refuses PT on HD days. Per Renal Navigator notes there are no clinics that will accept patient unless she will have HD in chair. Will continue to encourage use of chair for HD.  Ultimately patient is making her own choices that are prolonging hospitalization.   Tobie Poet, NP [facility] 03/03/2022,10:25 AM  LOS: 189 days   Patient seen and examined, agree with above note with above modifications. Difficult situation with no easy solutions.  Continue with routine HD and appropriate titration of HD related meds as above  Corliss Parish, MD 03/03/2022

## 2022-03-03 NOTE — Progress Notes (Signed)
PROGRESS NOTE    [name]  ATF:[id number] DOB: 1965-10-06 DOA: 08/26/2021 PCP: [name], MD   Brief Narrative: This 57 year old female with PMH significant of hypertension, hyperlipidemia, diabetes mellitus type 2, end-stage renal disease on hemodialysis, status post left BKA for chronic calcaneus osteomyelitis on 01/12/2020, bipolar disorder, left distal femoral fracture after a fall on 07/19/2021 deemed  nonoperative, admission from 08/03/2021-08/23/2021 for left femoral osteomyelitis requiring partial resection of femur and rearrangements of bony fragments with subsequent antibiotic treatment presented to the hospital with left hip bleeding and was found to have subacute osteomyelitis and abscess of left femur.  Orthopedic/ID were consulted and patient was started on broad-spectrum antibiotics.  Patient underwent multiple I&D's by Dr. [name] along with local tissue rearrangement.  Patient was also seen by vascular surgery who did fistula branch ligation.  Patient has completed 6-week course of daptomycin on 10/09/2021.  She has been unable to complete hemodialysis in chair and outpatient hemodialysis unit has been hard to find. She is otherwise medically stable for discharge.      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 Orthopedic surgery consulted. ID consulted. Patient underwent hardware removal on 6/10 (prior to admission) with I&D on 6/28 and 7/1. Infectious disease recommendations for 6 weeks of Daptomycin; patient has completed course. Stable.  History of left intertrochanteric fracture This was previously managed with IM nail placement in 2021. Hardware removed secondary to above.  ESRD on HD Nephrology on board.  -HD per nephrology  Anasarca Improved with hemodialysis.  Hyponatremia Management with hemodialysis.  Hypokalemia Hyperkalemia Management with  hemodialysis  Anemia of chronic disease Acute blood loss anemia In setting of CKD in addition to blood loss from wound. Patient has received a total of 2 units of PRBC via transfusion. Hemoglobin stable.  Diabetes mellitus type 2 Diabetes is uncontrolled with hyperglycemia. Most recent hemoglobin A1C of 7.1%. Patient is on insulin glargine and insulin aspart as an outpatient. -Continue Semglee 10 units qHS, Novolog 6 units TID with meals and SSI  Migraine headaches Patient previously on Topomax for prophylaxis. Patient with frequently recurrent migraine headaches this admission. Patient feels that her headaches may be improved since starting Topamax. -Continue Topamax 12.5 mg daily (renally dosed) and titrate as needed/safe  Primary hypertension -Continue hydralazine and Coreg  Hyperlipidemia History of CVA -Continue Lipitor  GERD -Continue Pepcid and Protonix  Tobacco use Tobacco cessation discussed during this admission.  History of left BKA Noted.  Debility Patient is not always consistent with HD secondary to subjective reasons. This also affects physical therapy with patient deciding when and how much to participate. Patient voices goals for improvement but inconsistently follows through, and this has contributed to long length of stay. Psychiatry consulted with recommendations for consistent boundaries and consideration of enrollment in [facility] program.   DVT prophylaxis: Heparin subq Code Status:   Code Status: Full Code Family Communication: None at bedside Disposition Plan: Discharge to SNF. Medically stable for discharge. Needs to tolerate HD while in a chair.   Consultants:  Orthopedic surgery Infectious disease Nephrology Vascular surgery Psychiatry  Procedures:  Vascular ultrasound duplex dialysis access 09/07/2021 Fistula revision branch ligation per Dr. Unk Lightning 09/19/2021- Left hip debridement, application of wound VAC by Dr. [name]  08/28/2021  Antimicrobials: Daptomycin Cefazolin   Subjective: Patient states she is having more symptoms from neuropathy. No other concerns.  Objective: BP (!) 168/60 (BP Location: Right Arm)  Pulse 66   Temp 98.5 F (36.9 C)   Resp 17   Ht 5\' 10"  (1.778 m)   Wt 95.9 kg   SpO2 100%   BMI 30.34 kg/m   Examination:  General exam: Appears calm and comfortable Respiratory system: Respiratory effort normal. Central nervous system: Alert and oriented. Psychiatry: Judgement and insight appear normal. Mood & affect appropriate.    Data Reviewed: I have personally reviewed following labs and imaging studies  CBC Lab Results  Component Value Date   WBC 6.2 03/02/2022   RBC 3.22 (L) 03/02/2022   HGB 9.5 (L) 03/02/2022   HCT 28.7 (L) 03/02/2022   MCV 89.1 03/02/2022   MCH 29.5 03/02/2022   PLT 191 03/02/2022   MCHC 33.1 03/02/2022   RDW 13.2 03/02/2022   LYMPHSABS 1.8 02/03/2022   MONOABS 0.9 02/03/2022   EOSABS 0.2 02/03/2022   BASOSABS 0.1 64/38/3818     Last metabolic panel Lab Results  Component Value Date   NA 128 (L) 03/02/2022   K 4.8 03/02/2022   CL 93 (L) 03/02/2022   CO2 23 03/02/2022   BUN 96 (H) 03/02/2022   CREATININE 6.50 (H) 03/02/2022   GLUCOSE 181 (H) 03/02/2022   GFRNONAA 7 (L) 03/02/2022   GFRAA 13 (L) 12/17/2018   CALCIUM 8.4 (L) 03/02/2022   PHOS 6.5 (H) 03/02/2022   PROT 7.6 12/02/2021   ALBUMIN 3.5 03/02/2022   LABGLOB 3.1 10/29/2018   AGRATIO 1.4 10/29/2018   BILITOT 0.6 12/02/2021   ALKPHOS 105 12/02/2021   AST 16 12/02/2021   ALT 15 12/02/2021   ANIONGAP 12 03/02/2022    GFR: Estimated Creatinine Clearance: 12.4 mL/min (A) (by C-G formula based on SCr of 6.5 mg/dL (H)).  No results found for this or any previous visit (from the past 240 hour(s)).    Radiology Studies: No results found.    LOS: 189 days    Cordelia Poche, MD [facility] 03/03/2022, 2:03 PM   If 7PM-7AM, please contact  night-coverage [email or web address]

## 2022-03-03 NOTE — Plan of Care (Signed)
  Problem: Nutritional: Goal: Ability to make healthy dietary choices will improve Outcome: Progressing   Problem: Nutrition: Goal: Adequate nutrition will be maintained Outcome: Progressing   Problem: Elimination: Goal: Will not experience complications related to bowel motility Outcome: Progressing   Problem: Activity: Goal: Risk for activity intolerance will decrease Outcome: Not Progressing   Problem: Pain Managment: Goal: General experience of comfort will improve Outcome: Not Progressing

## 2022-03-04 DIAGNOSIS — N186 End stage renal disease: Secondary | ICD-10-CM | POA: Diagnosis not present

## 2022-03-04 DIAGNOSIS — E1169 Type 2 diabetes mellitus with other specified complication: Secondary | ICD-10-CM | POA: Diagnosis not present

## 2022-03-04 DIAGNOSIS — M86252 Subacute osteomyelitis, left femur: Secondary | ICD-10-CM | POA: Diagnosis not present

## 2022-03-04 DIAGNOSIS — Z8673 Personal history of transient ischemic attack (TIA), and cerebral infarction without residual deficits: Secondary | ICD-10-CM | POA: Diagnosis not present

## 2022-03-04 LAB — GLUCOSE, CAPILLARY
Glucose-Capillary: 110 mg/dL — ABNORMAL HIGH (ref 70–99)
Glucose-Capillary: 147 mg/dL — ABNORMAL HIGH (ref 70–99)
Glucose-Capillary: 98 mg/dL (ref 70–99)
Glucose-Capillary: 219 mg/dL — ABNORMAL HIGH (ref 70–99)

## 2022-03-04 MED ORDER — ALTEPLASE 2 MG IJ SOLR: 2.0000 mg | Freq: Once | INTRAMUSCULAR | Status: DC | PRN

## 2022-03-04 MED ORDER — PENTAFLUOROPROP-TETRAFLUOROETH EX AERO: 1.0000 | INHALATION_SPRAY | CUTANEOUS | Status: DC | PRN

## 2022-03-04 MED ORDER — HEPARIN SODIUM (PORCINE) 1000 UNIT/ML DIALYSIS
1000.0000 [IU] | INTRAMUSCULAR | Status: DC | PRN
  Filled 2022-03-04: qty 1

## 2022-03-04 NOTE — Progress Notes (Signed)
Occupational Therapy Treatment Patient Details Name: [name] MRN: [id number] DOB: November 15, 1965 Today's Date: 03/04/2022   History of present illness 57 y.o. female presented to ED 6/26 from dialysis with increased bloody drainage from L hip wound. 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 s/p hardware removal of left hip with partial resection of tuft tissue and femure that were nonviable on 08/10/21 Discharged home 6/23. underwent L hip debridement and wound vac placement 6/28; +Left intertrochanteric non-union,  Fracture of left distal femur  PMH: hypertension, hyperlipidemia, ESRD on HD MWF, history of left BKA in 2021, depression/anxiety, stroke, tobacco use, T2DM,  insomnia, chronic pain syndrome.   OT comments  Patient goals of care and plan of care discussed this date.  Goals advanced as needed, with focus to be out of bed and wheelchair level independence, while still working on her goal of walking.  Patient deficits discussed including refusals with respect to care and therapies.  Patient agree with plan of care discussed, and is willing to try 2 session on non dialysis days.  OT also discussed that if refusals continue, that OT will discontinue.  Patient verbalized understanding, and remarks she does want to return home.  OT will continue efforts in the acute setting to maximize her status, as ling as she continues to participate, and until she can improve her functional status, SNF is recommended for post acute rehab prior to returning home.     Recommendations for follow up therapy are one component of a multi-disciplinary discharge planning process, led by the attending physician.  Recommendations may be updated based on patient status, additional functional criteria and insurance authorization.    Follow Up Recommendations  Skilled nursing-short term rehab (<3 hours/day)     Assistance Recommended at Discharge Intermittent Supervision/Assistance   Patient can return home with the following  A lot of help with bathing/dressing/bathroom;Assistance with cooking/housework;Assist for transportation;Help with stairs or ramp for entrance;Two people to help with walking and/or transfers   Equipment Recommendations  Other (comment)    Recommendations for Other Services      Precautions / Restrictions Precautions Precautions: Fall;Other (comment) Precaution Comments: L BKA (baseline) Restrictions Weight Bearing Restrictions: No LLE Weight Bearing: Weight bearing as tolerated Other Position/Activity Restrictions: L distal femur fx; per Dr [name] 8/10 pt can full WB; Wear liner during the day and shrinker overnight  (has been declining donning liner/shrinker since 12/20, they have made skin and calf uncomfortable; discussed proper fit of shrinker goes above the knee, and she can wear antipersperant on resudual limb under       Mobility Bed Mobility Overal bed mobility: Needs Assistance Bed Mobility: Supine to Sit, Sit to Supine     Supine to sit: Min assist Sit to supine: Min assist, HOB elevated        Transfers                         Balance Overall balance assessment: Needs assistance Sitting-balance support: Feet supported Sitting balance-Leahy Scale: Good                                     ADL either performed or assessed with clinical judgement   ADL Overall ADL's : Needs assistance/impaired Eating/Feeding: Set up;Bed level   Grooming: Set up;Sitting;Wash/dry face;Oral care;Wash/dry hands  Lower Body Dressing: Sitting/lateral leans;Moderate assistance                                                      Cognition Arousal/Alertness: Awake/alert Behavior During Therapy: WFL for tasks assessed/performed Overall Cognitive Status: Within Functional Limits for tasks assessed                                          Exercises       Shoulder Instructions       General Comments     Pertinent Vitals/ Pain       Pain Assessment Pain Assessment: Faces Faces Pain Scale: Hurts a little bit Pain Location: L hip with mobility Pain Descriptors / Indicators: Discomfort, Grimacing Pain Intervention(s): Monitored during session                                                          Frequency  Min 2X/week        Progress Toward Goals  OT Goals(current goals can now be found in the care plan section)  Progress towards OT goals: Progressing toward goals  Acute Rehab OT Goals OT Goal Formulation: With patient/family Time For Goal Achievement: 03/10/22 Potential to Achieve Goals: Fair ADL Goals Pt Will Perform Lower Body Dressing: with set-up;sitting/lateral leans  Plan Discharge plan remains appropriate;Frequency remains appropriate    Co-evaluation                 AM-PAC OT "6 Clicks" Daily Activity     Outcome Measure   Help from another person eating meals?: None Help from another person taking care of personal grooming?: A Little Help from another person toileting, which includes using toliet, bedpan, or urinal?: A Lot Help from another person bathing (including washing, rinsing, drying)?: A Lot Help from another person to put on and taking off regular upper body clothing?: A Little Help from another person to put on and taking off regular lower body clothing?: A Lot 6 Click Score: 16    End of Session Equipment Utilized During Treatment: Gait belt;Other (comment)  OT Visit Diagnosis: Muscle weakness (generalized) (M62.81);Unsteadiness on feet (R26.81);History of falling (Z91.81)   Activity Tolerance Patient tolerated treatment well   Patient Left in bed;with call bell/phone within reach;with family/visitor present   Nurse Communication Mobility status        Time: 9629-5284 OT Time Calculation (min): 16 min  Charges: OT General Charges $OT Visit: 1  Visit OT Treatments $Self Care/Home Management : 8-22 mins  03/04/2022  RP, OTR/L  Acute Rehabilitation Services  Office:  [phone]   Metta Clines 03/04/2022, 4:23 PM

## 2022-03-04 NOTE — Progress Notes (Signed)
Physical Therapy Treatment Patient Details Name: [name] MRN: [id number] DOB: Sep 23, 1965 Today's Date: 03/04/2022   History of Present Illness 57 y.o. female presented to ED 6/26 from dialysis with increased bloody drainage from L hip wound. 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 s/p hardware removal of left hip with partial resection of tuft tissue and femure that were nonviable on 08/10/21 Discharged home 6/23. underwent L hip debridement and wound vac placement 6/28; +Left intertrochanteric non-union,  Fracture of left distal femur  PMH: hypertension, hyperlipidemia, ESRD on HD MWF, history of left BKA in 2021, depression/anxiety, stroke, tobacco use, T2DM,  insomnia, chronic pain syndrome,    PT Comments    Continuing work on functional mobility and activity tolerance;  Session focused on functional transfers;   After manual soft tissue work on L calf, donned prosthesis liner and attempted to don prosthesis, however unable to fit; Emphasized the need for consistent wearing of shrinker at night and prosthesis liner during the day; Pt can put antiperspirant on residual limb if sweating ine the liner causes itching; Kept liner on the rest of the session;   Noteworthy progress with lateral scooting bed to wheelchair on pt's L side -- min assist for the scoot transfer and up to heavy mod assist for sliding board placement; Able to use R foot on the floor as a pviot point and shift weight off of hips for smoother scooting; Will consider re-visiting the "head-hips relationship" with Ayumi over the next few sessions to continue making scoot transfers more efficient and functional;   Noted Daziya indicated to our Surveyor, quantity that she does not wish to leave the hospital until she can walk; Unfortunately, this acute care setting, with less ability to keep a consistent daily schedule, isn't helping Annia along towards this stated goal; Also, Shama is  making choices (declining PT, declining getting OOB, declining staying up and OOB, not wearing shrinker/prosthesis liner, etc) that aren't helping her reach independence;  a rehab setting (where PT/OT, HD, meals, and even ADLs like toileting) is the venue Paddy needs to reach independence;   What are our options for consistent inpatient psychotherapy here acutely?  Recommendations for follow up therapy are one component of a multi-disciplinary discharge planning process, led by the attending physician.  Recommendations may be updated based on patient status, additional functional criteria and insurance authorization.  Follow Up Recommendations  Skilled nursing-short term rehab (<3 hours/day) Can patient physically be transported by private vehicle: No   Assistance Recommended at Discharge Intermittent Supervision/Assistance  Patient can return home with the following A lot of help with bathing/dressing/bathroom;Assistance with cooking/housework;Assist for transportation;Two people to help with walking and/or transfers;Help with stairs or ramp for entrance   Equipment Recommendations  Other (comment);Hospital bed (Sliding board, drop-arm BSC; needs a wheelchair cushion; would need medical [name] Lei transport for HD)    Recommendations for Other Services Other (comment) (Psychotherapy)     Precautions / Restrictions Precautions Precautions: Fall;Other (comment) Precaution Comments: L BKA (baseline) Restrictions LLE Weight Bearing: Weight bearing as tolerated Other Position/Activity Restrictions: L distal femur fx; per Dr [name] 8/10 pt can full WB; Wear liner during the day and shrinker overnight  (has been declining donning liner/shrinker since 12/20, they have made skin and calf uncomfortable; discussed proper fit of shrinker goes above the knee, and she can wear antipersperant on resudual limb under     Mobility  Bed Mobility Overal bed mobility: Needs Assistance Bed Mobility: Supine to  Sit  Supine to sit: Min assist     General bed mobility comments: Got up to L side of bed today; used bedrails to pull her body towards EOB, and used R bedrail to begin pulling trunk around to her R; fatigued, and got to where she was oriented perpendicular to bed, R foor and L residual limb off of the EOB; min handheld assist to pull to sit    Transfers Overall transfer level: Needs assistance Equipment used: Sliding board Transfers: Bed to chair/wheelchair/BSC            Lateral/Scoot Transfers: Min assist, With slide board (Mod assist to help with sliding board placement) General transfer comment: Transferred to teh wheelchair on pt's L side today; Good use of R foot (mostly forefoot on floor as bed was raised to facilitate slightly "downhill" transfer) as a pivot point to shift weight onto and allow for unweighing of hips for smoother scoots    Ambulation/Gait                   Theme park manager mobility: Yes Wheelchair propulsion: Both upper extremities, Right lower extremity Wheelchair parts: Needs assistance Distance:  (in room, around obstacles) Wheelchair Assistance Details (indicate cue type and reason): Supervision; needed safety cue for locking brakes  Modified Rankin (Stroke Patients Only)       Balance     Sitting balance-Leahy Scale: Good Sitting balance - Comments: able to maintain balance while performing lateral scooting                                    Cognition Arousal/Alertness: Awake/alert Behavior During Therapy: WFL for tasks assessed/performed Overall Cognitive Status: Within Functional Limits for tasks assessed (for simple mobility cues)                                          Exercises      General Comments General comments (skin integrity, edema, etc.): Initial portion of session provided manual pressure techniques to L calf to  break up muscular knot that had developed; discussed the need to consistently wear a shrinker at night and prosthesis liner during the day to help with residual limb size for prosthesis fit; Pt voiced frustration with medication change (specifically Cymbalta) -- this PT listened, reflected, and validated.      Pertinent Vitals/Pain Pain Assessment Pain Assessment: Faces Faces Pain Scale: Hurts little more (Grimace to 4 when donning prosthesis liner) Pain Location: L hip with mobility; L knee pain with donning prosthesis liner Pain Descriptors / Indicators: Discomfort, Grimacing Pain Intervention(s): Monitored during session, Other (comment) (tried to coordinate with pain meds; ultimately asked pt to decide when  she wanted PT (within a window that works with schedule))    Home Living                          Prior Function            PT Goals (current goals can now be found in the care plan section) Acute Rehab PT Goals Patient Stated Goal: Wants to be independent; wants to be able to drive herself to/from HD; Today, pt states "I need to get out of here (  meaning the hospital)" PT Goal Formulation: With patient Time For Goal Achievement: 03/18/22 Potential to Achieve Goals: Fair Additional Goals Additional Goal #2: Pt will propel wheelchair greater than 1000 feet including turns, and manage parts (especially brakes) without cues Progress towards PT goals: Progressing toward goals (Slow, but noteworthy progress; Goal update done, see care plan)    Frequency    Min 2X/week      PT Plan Current plan remains appropriate    Co-evaluation              AM-PAC PT "6 Clicks" Mobility   Outcome Measure  Help needed turning from your back to your side while in a flat bed without using bedrails?: None Help needed moving from lying on your back to sitting on the side of a flat bed without using bedrails?: A Little Help needed moving to and from a bed to a chair  (including a wheelchair)?: A Little Help needed standing up from a chair using your arms (e.g., wheelchair or bedside chair)?: Total Help needed to walk in hospital room?: Total Help needed climbing 3-5 steps with a railing? : Total 6 Click Score: 13    End of Session Equipment Utilized During Treatment: Other (comment) (sliding board) Activity Tolerance: Patient tolerated treatment well Patient left: in chair;Other (comment) (in wheelchair, getting around in room with Mobility Team) Nurse Communication: Mobility status PT Visit Diagnosis: Other abnormalities of gait and mobility (R26.89);Muscle weakness (generalized) (M62.81);History of falling (Z91.81);Pain Pain - Right/Left: Left Pain - part of body: Leg (and R foot and ankle)     Time: 1610-9604 PT Time Calculation (min) (ACUTE ONLY): 35 min  Charges:  $Therapeutic Activity: 23-37 mins                     Roney Marion, PT  Acute Rehabilitation Services Office [phone]    Colletta Maryland 03/04/2022, 2:30 PM

## 2022-03-04 NOTE — Progress Notes (Addendum)
[facility] Progress Note   Subjective:    Seen and examined patient at bedside. No acute complaints. Bps somewhat improved. Next HD 03/05/22.  Objective Vitals:   03/02/22 2025 03/03/22 1022 03/03/22 1648 03/03/22 2100  BP: (!) 166/53 (!) 168/60 (!) 154/76 (!) 149/79  Pulse: 75 66 62 72  Resp: 18 17 18 18   Temp:  98.5 F (36.9 C) 98.7 F (37.1 C) 98.3 F (36.8 C)  TempSrc: Oral  Oral Oral  SpO2: 99% 100% 100% 100%  Weight:      Height:       Physical Exam General: [name] Heart: RRR, no murmurs, rubs or gallops Lungs: CTA bilaterally Abdomen: Soft, non-distended, +BS  Extremities 1+ pitting edema bilateral lower extremities Dialysis Access: RUE AVF (+) B/T  Filed Weights   02/28/22 1212 03/02/22 0943 03/02/22 1258  Weight: 97.9 kg 100 kg 95.9 kg    Intake/Output Summary (Last 24 hours) at 03/04/2022 1024 Last data filed at 03/04/2022 6962 Gross per 24 hour  Intake 600 ml  Output 600 ml  Net 0 ml    Additional Objective Labs: Basic Metabolic Panel: Recent Labs  Lab 02/27/22 1530 02/28/22 0845 03/02/22 0942  NA 126* 126* 128*  K 5.9* 5.4* 4.8  CL 89* 90* 93*  CO2 21* 22 23  GLUCOSE 124* 159* 181*  BUN 125* 129* 96*  CREATININE 7.72* 8.00* 6.50*  CALCIUM 8.5* 8.4* 8.4*  PHOS 6.3* 6.9* 6.5*   Liver Function Tests: Recent Labs  Lab 02/27/22 1530 02/28/22 0845 03/02/22 0942  ALBUMIN 3.7 3.5 3.5   No results for input(s): "LIPASE", "AMYLASE" in the last 168 hours. CBC: Recent Labs  Lab 02/27/22 1606 02/28/22 0845 03/02/22 0942  WBC 6.5 7.0 6.2  HGB 10.0* 9.3* 9.5*  HCT 30.3* 27.6* 28.7*  MCV 89.9 88.2 89.1  PLT 213 183 191   Blood Culture    Component Value Date/Time   SDES TISSUE 08/28/2021 0917   SPECREQUEST LEFT THIGH TISSUE 08/28/2021 0917   CULT  08/28/2021 0917    MODERATE ENTEROCOCCUS FAECIUM VANCOMYCIN RESISTANT ENTEROCOCCUS ISOLATED NO ANAEROBES ISOLATED Sent to [place] for further susceptibility  testing. Performed at [facility], [place] [address] [place], [place] [address]    REPTSTATUS 09/02/2021 FINAL 08/28/2021 1324    Cardiac Enzymes: No results for input(s): "CKTOTAL", "CKMB", "CKMBINDEX", "TROPONINI" in the last 168 hours. CBG: Recent Labs  Lab 03/03/22 0737 03/03/22 1128 03/03/22 1647 03/03/22 2101 03/04/22 0739  GLUCAP 170* 203* 172* 201* 110*   Iron Studies: No results for input(s): "IRON", "TIBC", "TRANSFERRIN", "FERRITIN" in the last 72 hours. Lab Results  Component Value Date   INR 1.1 12/28/2019   INR 1.2 12/17/2018   INR 0.99 04/07/2017   Studies/Results: No results found.  Medications:  albumin human      (feeding supplement) PROSource Plus  30 mL Oral BID BM   alosetron  1 mg Oral BID   amitriptyline  50 mg Oral QHS   atorvastatin  40 mg Oral q1800   calcitRIOL  0.25 mcg Oral Daily   carvedilol  6.25 mg Oral BID   Chlorhexidine Gluconate Cloth  6 each Topical Q0600   darbepoetin (ARANESP) injection - DIALYSIS  60 mcg Subcutaneous Q Fri-1800   docusate sodium  200 mg Oral BID   DULoxetine  40 mg Oral Daily   fentaNYL  1 patch Transdermal Q72H   furosemide  80 mg Oral Daily   hydrALAZINE  50 mg  Oral TID   insulin aspart  0-9 Units Subcutaneous TID WC   insulin aspart  6 Units Subcutaneous TID WC   insulin glargine-yfgn  10 Units Subcutaneous QHS   methocarbamol  500 mg Oral QID   mirtazapine  7.5 mg Oral QHS   multivitamin  1 tablet Oral QHS   pantoprazole  40 mg Oral Daily   sevelamer carbonate  2,400 mg Oral TID WC   sodium zirconium cyclosilicate  10 g Oral Once per day on Sun Tue Thu Sat   topiramate  12.5 mg Oral Daily    Dialysis Orders: MWF at [place], 400/500, EDW 90kg, 2K/2Ca, LUE AVF - No heparin prior to admission, no ESA  Assessment/Plan: 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 Completed 6 wk course of IV Daptomycin on 10/12/21. Per Dr. Jess Barters note 12/28/2021  "NO RESTRICTIONS L HIP".  Chronic L hip/residual limb pain: Hx fractures of L femoral neck (where hardware was removed) as well as distal femur Fx. Pain control per primary.  ESRD: Usual MWF schedule. Refused HD 02/26/2022. Previously AVF infiltrated but is working well as of now, is back on regular MWF schedule. Clots HD system without heparin, 4000 unit IV bolus and Heparin 2000 units IV mid-run bolus TIW.  Hyperkalemia: Stable on scheduled lokelma 4 days a week. HTN/volume: BP meds have been tapered down. Large fluids gains between HD which has been discussed extensively, Ongoing edema bilateral lower extremity, Na low and BP high, all pointing to excess volume. Last full HD was 12/22. Continue to use max UF with HD-plan for UFG 4-5L with HD tomorrow.  Anemia of ESRD: Last tsat 54% from Oct. Aranesp recently resumed. Hgb now 9.5. Will order iron studies Secondary HPTH: CorrCa ok, no VDRA. Phos 6.2, continue Renvela as binder. Needs diet compliance. Will order PTH  Nutrition: Alb 3.7, continue protein supplements.  Renal diet w/fluid restrictions.   AVF dysfunction (resolved): S/p branch ligation 09/19/2021  Dr. Virl Cagey. Infiltration earlier this week, see above.  DM2: Insulin per primary. BS have been stable.  Dispo: SNF previously recommended, but she declined. Not home HD candidate.  Dialyzed in recliner on 9/4 but signed off early due to pain, has declined to come in a recliner since then.  Strongly discussed on the importance of increasing her mobility as much as possible while she is in her room in addition to following her ongoing fluid restriction. Also reinforced the importance of being in the chair for HD treatments to improve strength and mobility- declines again today. Does not like to do PT after HD, tried to do HD in the afternoons so she can do PT in the AM but she refuses PT on HD days. Per Renal Navigator notes there are no clinics that will accept patient unless she will have HD in chair.  Will continue to encourage use of chair for HD. Ultimately patient is making her own choices that are prolonging hospitalization.   Tobie Poet, NP [facility] 03/04/2022,10:24 AM  LOS: 190 days   Patient seen and examined, agree with above note with above modifications. No new c/o's-  seems to be delusional-  thinks that she is going to be able to drive to dialysis when she goes home ?  Continue with routine HD and appropriate adjustments of HD related meds  Corliss Parish, MD 03/04/2022

## 2022-03-04 NOTE — Progress Notes (Signed)
PROGRESS NOTE    [name]  TKP:[id number] DOB: [birth year] DOA: 08/26/2021 PCP: [name], MD   Brief Narrative: This 57 year old female with PMH significant of hypertension, hyperlipidemia, diabetes mellitus type 2, end-stage renal disease on hemodialysis, status post left BKA for chronic calcaneus osteomyelitis on 01/12/2020, bipolar disorder, left distal femoral fracture after a fall on 07/19/2021 deemed  nonoperative, admission from 08/03/2021-08/23/2021 for left femoral osteomyelitis requiring partial resection of femur and rearrangements of bony fragments with subsequent antibiotic treatment presented to the hospital with left hip bleeding and was found to have subacute osteomyelitis and abscess of left femur.  Orthopedic/ID were consulted and patient was started on broad-spectrum antibiotics.  Patient underwent multiple I&D's by Dr. [name] along with local tissue rearrangement.  Patient was also seen by vascular surgery who did fistula branch ligation.  Patient has completed 6-week course of daptomycin on 10/09/2021.  She has been unable to complete hemodialysis in chair and outpatient hemodialysis unit has been hard to find. She is otherwise medically stable for discharge.      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 Orthopedic surgery consulted. ID consulted. Patient underwent hardware removal on 6/10 (prior to admission) with I&D on 6/28 and 7/1. Infectious disease recommendations for 6 weeks of Daptomycin; patient has completed course. Stable.  History of left intertrochanteric fracture This was previously managed with IM nail placement in 2021. Hardware removed secondary to above.  ESRD on HD Nephrology on board.  -HD per nephrology  Anasarca Improved with hemodialysis.  Hyponatremia Management with hemodialysis.  Hypokalemia Hyperkalemia Management with  hemodialysis  Anemia of chronic disease Acute blood loss anemia In setting of CKD in addition to blood loss from wound. Patient has received a total of 2 units of PRBC via transfusion. Hemoglobin stable.  Diabetes mellitus type 2 Diabetes is uncontrolled with hyperglycemia. Most recent hemoglobin A1C of 7.1%. Patient is on insulin glargine and insulin aspart as an outpatient. -Continue Semglee 10 units qHS, Novolog 6 units TID with meals and SSI  Migraine headaches Patient previously on Topomax for prophylaxis. Patient with frequently recurrent migraine headaches this admission. Patient feels that her headaches may be improved since starting Topamax. -Continue Topamax 12.5 mg daily (renally dosed) and titrate as needed/safe  Primary hypertension -Continue hydralazine and Coreg  Hyperlipidemia History of CVA -Continue Lipitor  GERD -Continue Pepcid and Protonix  Tobacco use Tobacco cessation discussed during this admission.  History of left BKA Noted.  Debility Patient is not always consistent with HD secondary to subjective reasons. This also affects physical therapy with patient deciding when and how much to participate. Patient voices goals for improvement but inconsistently follows through, and this has contributed to long length of stay. Psychiatry consulted with recommendations for consistent boundaries and consideration of enrollment in [facility] program.   DVT prophylaxis: Heparin subq Code Status:   Code Status: Full Code Family Communication: None at bedside Disposition Plan: Discharge to SNF. Medically stable for discharge. Needs to tolerate HD while in a chair.   Consultants:  Orthopedic surgery Infectious disease Nephrology Vascular surgery Psychiatry  Procedures:  Vascular ultrasound duplex dialysis access 09/07/2021 Fistula revision branch ligation per Dr. Unk Lightning 09/19/2021- Left hip debridement, application of wound VAC by Dr. [name]  08/28/2021  Antimicrobials: Daptomycin Cefazolin   Subjective: Neuropathy is a bit better today. Worse in left leg compared to right leg.  Objective: BP (!) 173/54 (BP Location: Left  Arm)   Pulse 64   Temp 98.5 F (36.9 C) (Oral)   Resp 18   Ht 5\' 10"  (1.778 m)   Wt 95.9 kg   SpO2 99%   BMI 30.34 kg/m   Examination:  General exam: Appears calm and comfortable Respiratory system: Respiratory effort normal. Gastrointestinal system: Abdomen is non-distended Central nervous system: Alert and oriented. Psychiatry: Judgement and insight appear normal. Mood & affect appropriate.   Data Reviewed: I have personally reviewed following labs and imaging studies  CBC Lab Results  Component Value Date   WBC 6.2 03/02/2022   RBC 3.22 (L) 03/02/2022   HGB 9.5 (L) 03/02/2022   HCT 28.7 (L) 03/02/2022   MCV 89.1 03/02/2022   MCH 29.5 03/02/2022   PLT 191 03/02/2022   MCHC 33.1 03/02/2022   RDW 13.2 03/02/2022   LYMPHSABS 1.8 02/03/2022   MONOABS 0.9 02/03/2022   EOSABS 0.2 02/03/2022   BASOSABS 0.1 03/54/6568     Last metabolic panel Lab Results  Component Value Date   NA 128 (L) 03/02/2022   K 4.8 03/02/2022   CL 93 (L) 03/02/2022   CO2 23 03/02/2022   BUN 96 (H) 03/02/2022   CREATININE 6.50 (H) 03/02/2022   GLUCOSE 181 (H) 03/02/2022   GFRNONAA 7 (L) 03/02/2022   GFRAA 13 (L) 12/17/2018   CALCIUM 8.4 (L) 03/02/2022   PHOS 6.5 (H) 03/02/2022   PROT 7.6 12/02/2021   ALBUMIN 3.5 03/02/2022   LABGLOB 3.1 10/29/2018   AGRATIO 1.4 10/29/2018   BILITOT 0.6 12/02/2021   ALKPHOS 105 12/02/2021   AST 16 12/02/2021   ALT 15 12/02/2021   ANIONGAP 12 03/02/2022    GFR: Estimated Creatinine Clearance: 12.4 mL/min (A) (by C-G formula based on SCr of 6.5 mg/dL (H)).  No results found for this or any previous visit (from the past 240 hour(s)).    Radiology Studies: No results found.    LOS: 190 days    Cordelia Poche, MD [facility] 03/04/2022, 12:56  PM   If 7PM-7AM, please contact night-coverage [email or web address]

## 2022-03-05 DIAGNOSIS — M86252 Subacute osteomyelitis, left femur: Secondary | ICD-10-CM | POA: Diagnosis not present

## 2022-03-05 LAB — GLUCOSE, CAPILLARY
Glucose-Capillary: 150 mg/dL — ABNORMAL HIGH (ref 70–99)
Glucose-Capillary: 238 mg/dL — ABNORMAL HIGH (ref 70–99)
Glucose-Capillary: 195 mg/dL — ABNORMAL HIGH (ref 70–99)
Glucose-Capillary: 170 mg/dL — ABNORMAL HIGH (ref 70–99)

## 2022-03-05 LAB — CBC
HCT: 27.9 % — ABNORMAL LOW (ref 36.0–46.0)
Hemoglobin: 9.3 g/dL — ABNORMAL LOW (ref 12.0–15.0)
MCH: 29.6 pg (ref 26.0–34.0)
MCHC: 33.3 g/dL (ref 30.0–36.0)
MCV: 88.9 fL (ref 80.0–100.0)
Platelets: 221 10*3/uL (ref 150–400)
RBC: 3.14 MIL/uL — ABNORMAL LOW (ref 3.87–5.11)
RDW: 13.4 % (ref 11.5–15.5)
WBC: 8 10*3/uL (ref 4.0–10.5)
nRBC: 0 % (ref 0.0–0.2)

## 2022-03-05 LAB — RENAL FUNCTION PANEL
Albumin: 3.6 g/dL (ref 3.5–5.0)
Anion gap: 16 — ABNORMAL HIGH (ref 5–15)
BUN: 101 mg/dL — ABNORMAL HIGH (ref 6–20)
CO2: 21 mmol/L — ABNORMAL LOW (ref 22–32)
Calcium: 8.7 mg/dL — ABNORMAL LOW (ref 8.9–10.3)
Chloride: 90 mmol/L — ABNORMAL LOW (ref 98–111)
Creatinine, Ser: 6.25 mg/dL — ABNORMAL HIGH (ref 0.44–1.00)
GFR, Estimated: 7 mL/min — ABNORMAL LOW (ref >60)
Glucose, Bld: 175 mg/dL — ABNORMAL HIGH (ref 70–99)
Phosphorus: 6.2 mg/dL — ABNORMAL HIGH (ref 2.5–4.6)
Potassium: 5.4 mmol/L — ABNORMAL HIGH (ref 3.5–5.1)
Sodium: 127 mmol/L — ABNORMAL LOW (ref 135–145)

## 2022-03-05 MED ORDER — HEPARIN SODIUM (PORCINE) 1000 UNIT/ML DIALYSIS: 4000.0000 [IU] | Freq: Once | INTRAMUSCULAR | Status: DC

## 2022-03-05 MED ORDER — HEPARIN SODIUM (PORCINE) 1000 UNIT/ML DIALYSIS
2000.0000 [IU] | Freq: Once | INTRAMUSCULAR | Status: AC
  Administered 2022-03-05: 2000 [IU] via INTRAVENOUS_CENTRAL

## 2022-03-05 NOTE — TOC Progression Note (Signed)
Transition of Care [facility]) - Initial/Assessment Note    Patient Details  Name: [name] MRN: [id number] Date of Birth: Dec 07, 1965  Transition of Care [facility]) CM/SW Contact:    Milinda Antis, [place] Phone Number: 03/05/2022, 3:48 PM  Clinical Narrative:                 LCSW met with patient at bedside. Patient inquired about whether home health has been solidified.  Patient was informed that home health will be arranged when the patient is nearing discharge.  LCSW inquired about the patient returning home.  The patient now reports that patient wants to be able to stand on her foot, as it is now hurting, so that she can pivot in and out of the car for transportation to dialysis.  The patient declined other means of transportation to get to dialysis and is adamant about daughter transporting.  LCSW scheduled meeting with [facility], patient, daughter, [facility] leadership, hospital leadership, 63M leadership and Nephrology for 03/08/2022.  TOC will continue to follow.  Expected Discharge Plan: Skilled Nursing Facility Barriers to Discharge: Continued Medical Work up   Patient Goals and CMS Choice Patient states their goals for this hospitalization and ongoing recovery are:: To return home CMS Medicare.gov Compare Post Acute Care list provided to:: Patient Choice offered to / list presented to : NA      Expected Discharge Plan and Services   Discharge Planning Services: CM Consult   Living arrangements for the past 2 months: Single Family Home                                      Prior Living Arrangements/Services Living arrangements for the past 2 months: Single Family Home Lives with:: Adult Children Patient language and need for interpreter reviewed:: Yes Do you feel safe going back to the place where you live?: Yes      Need for Family Participation in Patient Care: Yes (Comment) Care giver support system in place?: Yes (comment) Current home services: DME ([employer],  w/c) Criminal Activity/Legal Involvement Pertinent to Current Situation/Hospitalization: No - Comment as needed  Activities of Daily Living Home Assistive Devices/Equipment: Eyeglasses, Wheelchair ADL Screening (condition at time of admission) Patient's cognitive ability adequate to safely complete daily activities?: Yes Is the patient deaf or have difficulty hearing?: No Does the patient have difficulty seeing, even when wearing glasses/contacts?: No Does the patient have difficulty concentrating, remembering, or making decisions?: No Patient able to express need for assistance with ADLs?: Yes Does the patient have difficulty dressing or bathing?: Yes Independently performs ADLs?: No Communication: Independent Dressing (OT): Independent Is this a change from baseline?: Pre-admission baseline Grooming: Independent Feeding: Independent Bathing: Needs assistance Is this a change from baseline?: Pre-admission baseline Toileting: Needs assistance Is this a change from baseline?: Pre-admission baseline In/Out Bed: Needs assistance, Dependent Is this a change from baseline?: Pre-admission baseline Walks in Home: Dependent Is this a change from baseline?: Pre-admission baseline Does the patient have difficulty walking or climbing stairs?: Yes Weakness of Legs: Both Weakness of Arms/Hands: Both  Permission Sought/Granted Permission sought to share information with : Case Manager, Family Supports Permission granted to share information with : Yes, Verbal Permission Granted              Emotional Assessment Appearance:: Appears stated age Attitude/Demeanor/Rapport: Engaged, Gracious Affect (typically observed): Accepting, Appropriate, Calm, Hopeful Orientation: : Oriented to Self, Oriented  to Place, Oriented to  Time, Oriented to Situation Alcohol / Substance Use: Not Applicable Psych Involvement: No (comment)  Admission diagnosis:  Wound infection [T14.8XXA, L08.9] Abscess of  left hip [L02.416] Patient Active Problem List   Diagnosis Date Noted   Wound dehiscence, surgical, sequela    Abscess of left hip 08/26/2021   History of CVA (cerebrovascular accident) 60/06/5995   Hardware complicating wound infection ([place])    Subacute osteomyelitis of left femur with abscess     Septic arthritis of hip ([place]) 07/30/2021   Skin abscess L hip 07/28/2021   HCAP (healthcare-associated pneumonia) 07/27/2021   Hyperkalemia 74/14/2395   Metabolic acidemia 32/04/3341   Closed fracture of left distal femur ([place]) 07/22/2021   Type 2 diabetes mellitus with hyperlipidemia ([place]) 07/22/2021   Infection of superficial incisional surgical site after procedure 03/09/2020   Abscess    Hypoglycemia    Essential hypertension    Sleep disturbance    ESRD (end stage renal disease) on dialysis ([place]) 01/24/2020   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[place]) 01/23/2020   Hyponatremia    Constipation    Chronic osteomyelitis involving left ankle and foot ([place])    Ulcer of left foot with necrosis of bone ([place])    Wound infection 12/28/2019   History of [place] amputation of left foot ([place]) 12/25/2017   History of CVA (cerebrovascular accident) without residual deficits 07/04/2017   Cerebral thrombosis with cerebral infarction 04/08/2017   Right sided weakness 04/07/2017   Hyperhidrosis 09/01/2016   Migraine with aura and without status migrainosus, not intractable 07/28/2016   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[place]) 08/04/2014   CKD stage 3 due to type 2 diabetes mellitus ([place]) 06/27/2014   Vitamin D insufficiency 05/08/2014   Obesity (BMI 30.0-34.9) 05/08/2014   Unilateral amputation of left foot ([place]) 05/08/2014   Bursitis of left shoulder 02/14/2014   Neck pain 01/03/2014   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.23) 01/14/2013   Insomnia 08/04/2012   Diabetic neuropathy, painful ([place]) 08/04/2011   Anxiety and depression  05/16/2010   Female stress incontinence 11/01/2007   TOBACCO DEPENDENCE 04/30/2006   GASTROESOPHAGEAL REFLUX, NO ESOPHAGITIS 04/30/2006   Irritable bowel syndrome 04/30/2006   PCP:  [name], MD Pharmacy:   [place], [place] [address] [place] [address] Phone: [phone] Fax: [phone]  [id number] [place], [email or web address]: [phone]: [phone]    Social Determinants of Health (SDOH) Social History: SDOH Screenings   Food Insecurity: No Food Insecurity (12/30/2021)  Housing: Low Risk  (12/30/2021)  Transportation Needs: No Transportation Needs (12/30/2021)  Utilities: Not At Risk (12/30/2021)  Depression (PHQ2-9): Medium Risk (03/09/2020)  Tobacco Use: High Risk (09/20/2021)   SDOH Interventions:     Readmission Risk Interventions    07/26/2021    3:15 PM  Readmission Risk Prevention Plan  Transportation Screening Complete  PCP or Specialist Appt within 3-5 Days Complete  HRI or [place]  Palliative Care Screening Not Applicable  Medication Review (RN Care Manager) Referral to Pharmacy

## 2022-03-05 NOTE — Progress Notes (Signed)
Received patient in bed to unit.  Alert and oriented.  Informed consent signed and in chart.   Treatment initiated: 0830 Treatment completed: 1208  Patient tolerated well.  Transported back to the room  Alert, without acute distress.  Hand-off given to patient's nurse.   Access used: AVF Access issues: None  Total UF removed: 3.4L Medication(s) given: None Post HD VS: 848/59,27,63 Post HD weight: 92.5kg   [facility]

## 2022-03-05 NOTE — Progress Notes (Signed)
Mobility Specialist Progress Note:    03/05/22 1500  Mobility  Activity  (Stretches and Exercises)  Range of Motion/Exercises Active;Passive  Activity Response Tolerated well  Mobility Referral Yes  $Mobility charge 1 Mobility   Pt was agreeable to mobility session. Left pt in bed with all needs met.  Royetta Crochet Mobility Specialist Please contact via Solicitor or  Rehab office at [phone]

## 2022-03-05 NOTE — Progress Notes (Addendum)
[facility] Progress Note   Subjective:    Seen and examined patient on HD. Tolerating UFG 5L. No acute complaints. Noted BP 171/136.  Objective Vitals:   03/04/22 2057 03/05/22 0805 03/05/22 0815 03/05/22 0830  BP:  (!) 176/67 (!) 176/54 (!) 171/136  Pulse: 66 65  64  Resp: 18 16  (!) 23  Temp: 98 F (36.7 C) 98.1 F (36.7 C)    TempSrc: Oral Oral    SpO2: 95% 97%  100%  Weight:      Height:       Physical Exam General: [name] Heart: RRR, no murmurs, rubs or gallops Lungs: CTA bilaterally Abdomen: Soft, non-distended, +BS Extremities 1+ pitting edema bilateral lower extremities Dialysis Access: RUE AVF (+) B/T  Filed Weights   02/28/22 1212 03/02/22 0943 03/02/22 1258  Weight: 97.9 kg 100 kg 95.9 kg    Intake/Output Summary (Last 24 hours) at 03/05/2022 0917 Last data filed at 03/04/2022 2200 Gross per 24 hour  Intake 480 ml  Output 1000 ml  Net -520 ml    Additional Objective Labs: Basic Metabolic Panel: Recent Labs  Lab 02/27/22 1530 02/28/22 0845 03/02/22 0942  NA 126* 126* 128*  K 5.9* 5.4* 4.8  CL 89* 90* 93*  CO2 21* 22 23  GLUCOSE 124* 159* 181*  BUN 125* 129* 96*  CREATININE 7.72* 8.00* 6.50*  CALCIUM 8.5* 8.4* 8.4*  PHOS 6.3* 6.9* 6.5*   Liver Function Tests: Recent Labs  Lab 02/27/22 1530 02/28/22 0845 03/02/22 0942  ALBUMIN 3.7 3.5 3.5   No results for input(s): "LIPASE", "AMYLASE" in the last 168 hours. CBC: Recent Labs  Lab 02/27/22 1606 02/28/22 0845 03/02/22 0942  WBC 6.5 7.0 6.2  HGB 10.0* 9.3* 9.5*  HCT 30.3* 27.6* 28.7*  MCV 89.9 88.2 89.1  PLT 213 183 191   Blood Culture    Component Value Date/Time   SDES TISSUE 08/28/2021 0917   SPECREQUEST LEFT THIGH TISSUE 08/28/2021 0917   CULT  08/28/2021 0917    MODERATE ENTEROCOCCUS FAECIUM VANCOMYCIN RESISTANT ENTEROCOCCUS ISOLATED NO ANAEROBES ISOLATED Sent to [place] for further susceptibility testing. Performed at [facility], [place] [address], [place], [place] [address]    REPTSTATUS 09/02/2021 FINAL 08/28/2021 2376    Cardiac Enzymes: No results for input(s): "CKTOTAL", "CKMB", "CKMBINDEX", "TROPONINI" in the last 168 hours. CBG: Recent Labs  Lab 03/04/22 0739 03/04/22 1139 03/04/22 1621 03/04/22 2056 03/05/22 0727  GLUCAP 110* 147* 98 219* 150*   Iron Studies: No results for input(s): "IRON", "TIBC", "TRANSFERRIN", "FERRITIN" in the last 72 hours. Lab Results  Component Value Date   INR 1.1 12/28/2019   INR 1.2 12/17/2018   INR 0.99 04/07/2017   Studies/Results: No results found.  Medications:  albumin human      (feeding supplement) PROSource Plus  30 mL Oral BID BM   alosetron  1 mg Oral BID   amitriptyline  50 mg Oral QHS   atorvastatin  40 mg Oral q1800   calcitRIOL  0.25 mcg Oral Daily   carvedilol  6.25 mg Oral BID   Chlorhexidine Gluconate Cloth  6 each Topical Q0600   darbepoetin (ARANESP) injection - DIALYSIS  60 mcg Subcutaneous Q Fri-1800   docusate sodium  200 mg Oral BID   DULoxetine  40 mg Oral Daily   fentaNYL  1 patch Transdermal Q72H   furosemide  80 mg Oral Daily   heparin  2,000 Units Dialysis Once in dialysis  heparin  4,000 Units Dialysis Once in dialysis   hydrALAZINE  50 mg Oral TID   insulin aspart  0-9 Units Subcutaneous TID WC   insulin aspart  6 Units Subcutaneous TID WC   insulin glargine-yfgn  10 Units Subcutaneous QHS   methocarbamol  500 mg Oral QID   mirtazapine  7.5 mg Oral QHS   multivitamin  1 tablet Oral QHS   pantoprazole  40 mg Oral Daily   sevelamer carbonate  2,400 mg Oral TID WC   sodium zirconium cyclosilicate  10 g Oral Once per day on Sun Tue Thu Sat   topiramate  12.5 mg Oral Daily    Dialysis Orders: MWF at [facility], 400/500, EDW 90kg, 2K/2Ca, LUE AVF - No heparin prior to admission, no ESA  Assessment/Plan: 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 Completed 6 wk course of IV  Daptomycin on 10/12/21. Per Dr. Jess Barters note 12/28/2021 "NO RESTRICTIONS L HIP".  Chronic L hip/residual limb pain: Hx fractures of L femoral neck (where hardware was removed) as well as distal femur Fx. Pain control per primary.  ESRD: Usual MWF schedule. Previously AVF infiltrated but is working well as of now, is back on regular MWF schedule. Clots HD system without heparin, 4000 unit IV bolus and Heparin 2000 units IV mid-run bolus TIW.  Hyperkalemia: Stable on scheduled lokelma 4 days a week. HTN/volume: Large fluids gains between HD which has been discussed extensively, Ongoing edema bilateral lower extremity, Na low and BP high, all pointing to excess volume. Last full HD was 12/22. Continue to use max UF with HD-plan for UFG 4-5L on HD. BP meds previously tapered down. If BP doesn't improve after HD, may need to titrate BO meds back up. Anemia of ESRD: Last tsat 54% from Oct. Aranesp recently resumed. Hgb now 9.5. Will order iron studies Secondary HPTH: CorrCa ok, no VDRA. Phos 6.2, continue Renvela as binder. Needs diet compliance. Will order PTH  Nutrition: Alb 3.7, continue protein supplements.  Renal diet w/fluid restrictions.   AVF dysfunction (resolved): S/p branch ligation 09/19/2021  Dr. Virl Cagey. Infiltration earlier this week, see above.  DM2: Insulin per primary. BS have been stable.  Dispo: SNF previously recommended, but she declined. Not home HD candidate.  Dialyzed in recliner on 9/4 but signed off early due to pain, has declined to come in a recliner since then.  Strongly discussed on the importance of increasing her mobility as much as possible while she is in her room in addition to following her ongoing fluid restriction. Also reinforced the importance of being in the chair for HD treatments to improve strength and mobility- declines again today. Does not like to do PT after HD, tried to do HD in the afternoons so she can do PT in the AM but she refuses PT on HD days. Per Renal  Navigator notes there are no clinics that will accept patient unless she will have HD in chair. Will continue to encourage use of chair for HD. Ultimately patient is making her own choices that are prolonging hospitalization.   Tobie Poet, NP [facility] 03/05/2022,9:17 AM  LOS: 191 days    Seen and examined independently.  Agree with note and exam as documented above by physician extender and as noted here.  Labs indicate she is under-dialyzed.  She has had multiple recent short treatments (Per charting had 1 hour on 12/24, 0.25 hrs on 12/20) and on a good day inpatient  will typically only be able to get 3-3.5 hours of HD per session.  She would be better served at a SNF or LTAC.    See my note from earlier today.    Claudia Desanctis, MD 11:53 AM 03/05/2022

## 2022-03-05 NOTE — Procedures (Signed)
Seen and examined on dialysis.  Procedure supervised.  Blood pressure 183/65 and right AVF just cannulated and in use.  Tolerating goal.    See our regular note from today as well.    She reports that I have not read the notes and that she does not want to dialyze in a chair.  She states that she will "sit in a chair when I'm good and ready".  She states that she feels that she needs to get stronger.  We discussed that an acute care hospital such as [facility] is not an appropriate location for long-term rehab and that this would be best served at a SNF or LTAC.  Per ortho 12/28/21 note - no restrictions for her left hip.  Claudia Desanctis, MD 03/05/2022  8:38 AM

## 2022-03-05 NOTE — Progress Notes (Signed)
PROGRESS NOTE    [name]  EXB:[id number] DOB: 12-Mar-1965 DOA: 08/26/2021 PCP: [name], MD   Brief Narrative:  This 57 year old female with PMH significant of hypertension, hyperlipidemia, diabetes mellitus type 2, end-stage renal disease on hemodialysis, status post left BKA for chronic calcaneus osteomyelitis on 01/12/2020, bipolar disorder, left distal femoral fracture after a fall on 07/19/2021 deemed  nonoperative, admission from 08/03/2021-08/23/2021 for left femoral osteomyelitis requiring partial resection of femur and rearrangements of bony fragments with subsequent antibiotic treatment presented to the hospital with left hip bleeding and was found to have subacute osteomyelitis and abscess of left femur.  Orthopedic/ID were consulted and patient was started on broad-spectrum antibiotics.  Patient underwent multiple I&D's by Dr. [name] along with local tissue rearrangement.  Patient was also seen by vascular surgery who did fistula branch ligation.  Patient has completed 6-week course of daptomycin on 10/09/2021.  She has been unable to complete hemodialysis in chair and outpatient hemodialysis unit has been hard to find. She is otherwise medically stable for discharge.    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  Active Problems:   ESRD (end stage renal disease) on dialysis ([place])   Type 2 diabetes mellitus with hyperlipidemia ([facility])   History of CVA (cerebrovascular accident)   Essential hypertension   Anxiety and depression   GASTROESOPHAGEAL REFLUX, NO ESOPHAGITIS   TOBACCO DEPENDENCE   Obesity (BMI 30.0-34.9)   Subacute osteomyelitis of left femur with abscess: Infected left hip hardware s/p removal: Patient started on Cefazolin. Orthopedic surgery consulted. ID consulted. Patient underwent hardware removal on 6/10 (prior to admission) with I&D on 6/28 and 7/1. Infectious disease recommendations for 6 weeks of Daptomycin;  patient has completed course. Stable.   History of left intertrochanteric fracture: This was previously managed with IM nail placement in 2021. Hardware removed secondary to above.   ESRD on HD Nephrology on board.  -HD per nephrology   Anasarca: Improved with hemodialysis.   Hyponatremia: Management with hemodialysis.   Hypokalemia: Hyperkalemia: Management with hemodialysis   Anemia of chronic disease: Acute blood loss anemia: In setting of CKD in addition to blood loss from wound.  Patient has received a total of 2 units of PRBC via transfusion.  Hemoglobin stable.   Diabetes mellitus type 2: Diabetes is uncontrolled with hyperglycemia. Most recent hemoglobin A1C of 7.1%.  Patient is on insulin glargine and insulin aspart as an outpatient. Continue Semglee 10 units qHS, Novolog 6 units TID with meals and SSI   Migraine headaches: Patient previously on Topomax for prophylaxis.  Patient with frequently recurrent migraine headaches this admission.  Patient feels that her headaches may be improved since starting Topamax. Continue Topamax 12.5 mg daily (renally dosed) and titrate as needed/safe   Essential HTN: Continue hydralazine and Coreg.   Hyperlipidemia History of CVA Continue Lipitor.   GERD Continue Pepcid and Protonix   Tobacco use Tobacco cessation discussed during this admission.   History of left BKA: Noted.   Debility Patient is not always consistent with HD secondary to subjective reasons. This also affects physical therapy with patient deciding when and how much to participate. Patient voices goals for improvement but inconsistently follows through, and this has contributed to long length of stay. Psychiatry consulted with recommendations for consistent boundaries and consideration of enrollment in STAR program.   DVT prophylaxis: Heparin sq Code Status: Full code. Family Communication: No family at bed side., Disposition Plan:   Discharge  to  SNF. Medically stable for discharge. Needs to tolerate HD while in a chair.   Consultants:  Orthopedic surgery Infectious disease Nephrology Vascular surgery Psychiatry  Procedures:  Vascular ultrasound duplex dialysis access 09/07/2021 Fistula revision branch ligation per Dr. Unk Lightning 09/19/2021- Left hip debridement, application of wound VAC by Dr. [name] 08/28/2021  Antimicrobials: Daptomycin Cefazolin   Subjective: Patient was seen and examined at bedside.  Overnight events noted. Patient states she has been trying to participate in physical therapy.  Not able to go home until she is able to stand.  Objective: Vitals:   03/05/22 1000 03/05/22 1030 03/05/22 1100 03/05/22 1130  BP: (!) 153/63 (!) 147/61 (!) 84/49 (!) 137/55  Pulse:    63  Resp:    13  Temp:      TempSrc:      SpO2:    96%  Weight:      Height:        Intake/Output Summary (Last 24 hours) at 03/05/2022 1337 Last data filed at 03/05/2022 1208 Gross per 24 hour  Intake 240 ml  Output 3800 ml  Net -3560 ml   Filed Weights   02/28/22 1212 03/02/22 0943 03/02/22 1258  Weight: 97.9 kg 100 kg 95.9 kg    Examination:  General exam: Appears comfortable, not in any acute distress.  Deconditioned Respiratory system: CTA bilaterally, respiratory effort normal, RR 13. Cardiovascular system: S1 & S2 heard, regular rate and rhythm, no murmur. Gastrointestinal system: Abdomen is soft, non tender, non distended, BS+ Central nervous system: Alert and oriented x 3. No focal neurological deficits. Extremities: Left BKA. Skin: No rashes, lesions or ulcers Psychiatry: Judgement and insight appear normal. Mood & affect appropriate.     Data Reviewed: I have personally reviewed following labs and imaging studies  CBC: Recent Labs  Lab 02/27/22 1606 02/28/22 0845 03/02/22 0942 03/05/22 0930  WBC 6.5 7.0 6.2 8.0  HGB 10.0* 9.3* 9.5* 9.3*  HCT 30.3* 27.6* 28.7* 27.9*  MCV 89.9 88.2 89.1 88.9  PLT 213 183 191 841    Basic Metabolic Panel: Recent Labs  Lab 02/27/22 1530 02/28/22 0845 03/02/22 0942 03/05/22 0835  NA 126* 126* 128* 127*  K 5.9* 5.4* 4.8 5.4*  CL 89* 90* 93* 90*  CO2 21* 22 23 21*  GLUCOSE 124* 159* 181* 175*  BUN 125* 129* 96* 101*  CREATININE 7.72* 8.00* 6.50* 6.25*  CALCIUM 8.5* 8.4* 8.4* 8.7*  PHOS 6.3* 6.9* 6.5* 6.2*   GFR: Estimated Creatinine Clearance: 12.9 mL/min (A) (by C-G formula based on SCr of 6.25 mg/dL (H)). Liver Function Tests: Recent Labs  Lab 02/27/22 1530 02/28/22 0845 03/02/22 0942 03/05/22 0835  ALBUMIN 3.7 3.5 3.5 3.6   No results for input(s): "LIPASE", "AMYLASE" in the last 168 hours. No results for input(s): "AMMONIA" in the last 168 hours. Coagulation Profile: No results for input(s): "INR", "PROTIME" in the last 168 hours. Cardiac Enzymes: No results for input(s): "CKTOTAL", "CKMB", "CKMBINDEX", "TROPONINI" in the last 168 hours. BNP (last 3 results) No results for input(s): "PROBNP" in the last 8760 hours. HbA1C: No results for input(s): "HGBA1C" in the last 72 hours. CBG: Recent Labs  Lab 03/04/22 0739 03/04/22 1139 03/04/22 1621 03/04/22 2056 03/05/22 0727  GLUCAP 110* 147* 98 219* 150*   Lipid Profile: No results for input(s): "CHOL", "HDL", "LDLCALC", "TRIG", "CHOLHDL", "LDLDIRECT" in the last 72 hours. Thyroid Function Tests: No results for input(s): "TSH", "T4TOTAL", "FREET4", "T3FREE", "THYROIDAB" in the last 72 hours. Anemia Panel: No  results for input(s): "VITAMINB12", "FOLATE", "FERRITIN", "TIBC", "IRON", "RETICCTPCT" in the last 72 hours. Sepsis Labs: No results for input(s): "PROCALCITON", "LATICACIDVEN" in the last 168 hours.  No results found for this or any previous visit (from the past 240 hour(s)).   Radiology Studies: No results found.  Scheduled Meds:  (feeding supplement) PROSource Plus  30 mL Oral BID BM   alosetron  1 mg Oral BID   amitriptyline  50 mg Oral QHS   atorvastatin  40 mg Oral q1800    calcitRIOL  0.25 mcg Oral Daily   carvedilol  6.25 mg Oral BID   Chlorhexidine Gluconate Cloth  6 each Topical Q0600   darbepoetin (ARANESP) injection - DIALYSIS  60 mcg Subcutaneous Q Fri-1800   docusate sodium  200 mg Oral BID   DULoxetine  40 mg Oral Daily   fentaNYL  1 patch Transdermal Q72H   furosemide  80 mg Oral Daily   hydrALAZINE  50 mg Oral TID   insulin aspart  0-9 Units Subcutaneous TID WC   insulin aspart  6 Units Subcutaneous TID WC   insulin glargine-yfgn  10 Units Subcutaneous QHS   methocarbamol  500 mg Oral QID   mirtazapine  7.5 mg Oral QHS   multivitamin  1 tablet Oral QHS   pantoprazole  40 mg Oral Daily   sevelamer carbonate  2,400 mg Oral TID WC   sodium zirconium cyclosilicate  10 g Oral Once per day on Sun Tue Thu Sat   topiramate  12.5 mg Oral Daily   Continuous Infusions:  albumin human       LOS: 191 days    Time spent: 35 mins    Maxyne Derocher, MD [facility]   If 7PM-7AM, please contact night-coverage

## 2022-03-06 DIAGNOSIS — M86252 Subacute osteomyelitis, left femur: Secondary | ICD-10-CM | POA: Diagnosis not present

## 2022-03-06 LAB — GLUCOSE, CAPILLARY
Glucose-Capillary: 104 mg/dL — ABNORMAL HIGH (ref 70–99)
Glucose-Capillary: 178 mg/dL — ABNORMAL HIGH (ref 70–99)
Glucose-Capillary: 112 mg/dL — ABNORMAL HIGH (ref 70–99)
Glucose-Capillary: 217 mg/dL — ABNORMAL HIGH (ref 70–99)

## 2022-03-06 MED ORDER — LOPERAMIDE HCL 2 MG PO CAPS
2.0000 mg | ORAL_CAPSULE | Freq: Two times a day (BID) | ORAL | Status: AC | PRN
  Administered 2022-03-06 – 2022-03-11 (×3): 2 mg via ORAL
  Filled 2022-03-06 (×3): qty 1

## 2022-03-06 NOTE — Progress Notes (Addendum)
[facility] Progress Note   Subjective:    Seen in room. Didn't tolerate 5L UF goal yesterday - cramped on machine. No new concerns today.   Objective Vitals:   03/05/22 1433 03/05/22 1720 03/05/22 2111 03/06/22 0850  BP: (!) 182/65 (!) 156/59 (!) 156/59 (!) 151/95  Pulse: 73 67 72 66  Resp: 17 16 18 17   Temp: 98.5 F (36.9 C) 98.4 F (36.9 C) 98 F (36.7 C) 98.4 F (36.9 C)  TempSrc: Oral Oral Oral Oral  SpO2: 97% 98% 98% 100%  Weight:      Height:        Filed Weights   02/28/22 1212 03/02/22 0943 03/02/22 1258  Weight: 97.9 kg 100 kg 95.9 kg    Intake/Output Summary (Last 24 hours) at 03/06/2022 1322 Last data filed at 03/06/2022 0400 Gross per 24 hour  Intake 120 ml  Output 200 ml  Net -80 ml     Additional Objective Labs: Basic Metabolic Panel: Recent Labs  Lab 02/28/22 0845 03/02/22 0942 03/05/22 0835  NA 126* 128* 127*  K 5.4* 4.8 5.4*  CL 90* 93* 90*  CO2 22 23 21*  GLUCOSE 159* 181* 175*  BUN 129* 96* 101*  CREATININE 8.00* 6.50* 6.25*  CALCIUM 8.4* 8.4* 8.7*  PHOS 6.9* 6.5* 6.2*    Liver Function Tests: Recent Labs  Lab 02/28/22 0845 03/02/22 0942 03/05/22 0835  ALBUMIN 3.5 3.5 3.6    No results for input(s): "LIPASE", "AMYLASE" in the last 168 hours. CBC: Recent Labs  Lab 02/27/22 1606 02/28/22 0845 03/02/22 0942 03/05/22 0930  WBC 6.5 7.0 6.2 8.0  HGB 10.0* 9.3* 9.5* 9.3*  HCT 30.3* 27.6* 28.7* 27.9*  MCV 89.9 88.2 89.1 88.9  PLT 213 183 191 221     Iron Studies: No results for input(s): "IRON", "TIBC", "TRANSFERRIN", "FERRITIN" in the last 72 hours. Lab Results  Component Value Date   INR 1.1 12/28/2019   INR 1.2 12/17/2018   INR 0.99 04/07/2017   Studies/Results: No results found.  Medications:  albumin human      (feeding supplement) PROSource Plus  30 mL Oral BID BM   alosetron  1 mg Oral BID   amitriptyline  50 mg Oral QHS   atorvastatin  40 mg Oral q1800   calcitRIOL  0.25 mcg Oral Daily    carvedilol  6.25 mg Oral BID   Chlorhexidine Gluconate Cloth  6 each Topical Q0600   darbepoetin (ARANESP) injection - DIALYSIS  60 mcg Subcutaneous Q Fri-1800   docusate sodium  200 mg Oral BID   DULoxetine  40 mg Oral Daily   fentaNYL  1 patch Transdermal Q72H   furosemide  80 mg Oral Daily   hydrALAZINE  50 mg Oral TID   insulin aspart  0-9 Units Subcutaneous TID WC   insulin aspart  6 Units Subcutaneous TID WC   insulin glargine-yfgn  10 Units Subcutaneous QHS   methocarbamol  500 mg Oral QID   mirtazapine  7.5 mg Oral QHS   multivitamin  1 tablet Oral QHS   pantoprazole  40 mg Oral Daily   sevelamer carbonate  2,400 mg Oral TID WC   sodium zirconium cyclosilicate  10 g Oral Once per day on Sun Tue Thu Sat   topiramate  12.5 mg Oral Daily    Physical Exam General: [name] Heart: RRR, no murmurs, rubs or gallops Lungs: CTA bilaterally Abdomen: Soft, non-distended, +BS Extremities 1+ pitting edema bilateral lower  extremities Dialysis Access: RUE AVF (+) B/T  Dialysis Orders: MWF at [place], 400/500, EDW 90kg, 2K/2Ca, LUE AVF - No heparin prior to admission, no ESA  Assessment/Plan: 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 Completed 6 wk course of IV Daptomycin on 10/12/21. Per Dr. Jess Barters note 12/28/2021 "NO RESTRICTIONS L HIP".  Chronic L hip/residual limb pain: Hx fractures of L femoral neck (where hardware was removed) as well as distal femur Fx. Pain control per primary.  ESRD: Usual MWF schedule. Previously AVF infiltrated but is working well as of now, is back on regular MWF schedule. Clots HD system without heparin, 4000 unit IV bolus and Heparin 2000 units IV mid-run bolus TIW.  Hyperkalemia: Stable on scheduled lokelma 4 days a week. HTN/volume: Large fluids gains between HD which has been discussed extensively, Ongoing edema bilateral lower extremity, Na low and BP high, all pointing to excess volume.  Last full HD was 12/22. Continue to use max UF with HD-plan for UFG 4-5L on HD. BP meds previously tapered down. If BP doesn't improve after HD, may need to titrate BO meds back up. Anemia of ESRD: Hb trending down. Last tsat 54% from Oct. check Fe studies. Aranesp 60 q Friday -last 12/29. recently resumed.  Secondary HPTH: CorrCa ok, no VDRA. Phos 6.2, continue Renvela as binder. Needs diet compliance. Nutrition: continue protein supplements.  Renal diet w/fluid restrictions.   AVF dysfunction (resolved): S/p branch ligation 09/19/2021  DM2: Insulin per primary.   Dispo: SNF recommended, but she declined. Not home HD candidate. Last dialyzed in recliner on 11/04/21.  Continues to refuse dialysis in recliner. Continues to refuse various aspects of care. Per Renal Navigator notes there are no clinics that will accept patient unless she will have HD in chair. Ultimately patient is making her own choices that are prolonging hospitalization.   Lynnda Child PA-C [facility] 03/06/2022,1:22 PM  Seen and examined by PA.  Agree with note and plans as documented above by physician extender.  My order for labs today was discontinued.  See that labs are ordered for tomorrow  Claudia Desanctis, MD 03/06/2022  6:26 PM

## 2022-03-06 NOTE — Progress Notes (Signed)
PT Cancellation Note  Patient Details Name: RHYLIN VENTERS MRN: [id number] DOB: 1965/10/15   Cancelled Treatment:    Reason Eval/Treat Not Completed: Medical issues which prohibited therapy; patient reports stomach upset and has had diarrhea several times this am.  RN aware.  Will check back later today.   Reginia Naas 03/06/2022, 10:51 AM Magda Kiel, PT Acute Rehabilitation Services Office:[phone] 03/06/2022

## 2022-03-06 NOTE — Progress Notes (Signed)
PROGRESS NOTE    [name]  HER:[id number] DOB: Jul 23, 1965 DOA: 08/26/2021 PCP: [name], MD   Brief Narrative:  This 57 year old female with PMH significant of hypertension, hyperlipidemia, diabetes mellitus type 2, end-stage renal disease on hemodialysis, status post left BKA for chronic calcaneus osteomyelitis on 01/12/2020, bipolar disorder, left distal femoral fracture after a fall on 07/19/2021 deemed  nonoperative, admission from 08/03/2021-08/23/2021 for left femoral osteomyelitis requiring partial resection of femur and rearrangements of bony fragments with subsequent antibiotic treatment presented to the hospital with left hip bleeding and was found to have subacute osteomyelitis and abscess of left femur.  Orthopedic/ID were consulted and patient was started on broad-spectrum antibiotics.  Patient underwent multiple I&D's by Dr. [name] along with local tissue rearrangement.  Patient was also seen by vascular surgery who did fistula branch ligation.  Patient has completed 6-week course of daptomycin on 10/09/2021.  She has been unable to complete hemodialysis in chair and outpatient hemodialysis unit has been hard to find. She is otherwise medically stable for discharge.    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  Active Problems:   ESRD (end stage renal disease) on dialysis ([facility])   Type 2 diabetes mellitus with hyperlipidemia (HCC)   History of CVA (cerebrovascular accident)   Essential hypertension   Anxiety and depression   GASTROESOPHAGEAL REFLUX, NO ESOPHAGITIS   TOBACCO DEPENDENCE   Obesity (BMI 30.0-34.9)   Subacute osteomyelitis of left femur with abscess: Infected left hip hardware s/p removal: Patient started on Cefazolin. Orthopedic surgery consulted. ID consulted. Patient underwent hardware removal on 6/10 (prior to admission) with I&D on 6/28 and 7/1. Infectious disease recommendations for 6 weeks of Daptomycin;  patient has completed course. Stable.   History of left intertrochanteric fracture: This was previously managed with IM nail placement in 2021. Hardware removed secondary to above.   ESRD on HD Nephrology on board.  -HD per nephrology Patient needs to tolerate hemodialysis in the chair.   Anasarca: Improved with hemodialysis.   Hyponatremia: Management with hemodialysis.   Hypokalemia: Hyperkalemia: Management with hemodialysis   Anemia of chronic disease: Acute blood loss anemia: In setting of CKD in addition to blood loss from wound.  Patient has received a total of 2 units of PRBC via transfusion.  Hemoglobin stable.   Diabetes mellitus type 2: Diabetes is uncontrolled with hyperglycemia. Most recent hemoglobin A1C of 7.1%.  Patient is on insulin glargine and insulin aspart as an outpatient. Continue Semglee 10 units qHS, Novolog 6 units TID with meals and SSI   Migraine headaches: Patient previously on Topomax for prophylaxis.  Patient with frequently recurrent migraine headaches this admission.  Patient feels that her headaches may be improved since starting Topamax. Continue Topamax 12.5 mg daily (renally dosed) and titrate as needed/safe   Essential HTN: Continue hydralazine and Coreg.   Hyperlipidemia History of CVA Continue Lipitor.   GERD Continue Pepcid and Protonix.   Tobacco use Tobacco cessation discussed during this admission.   History of left BKA: Noted.   Debility Patient is not always consistent with HD secondary to subjective reasons. This also affects physical therapy with patient deciding when and how much to participate. Patient voices goals for improvement but inconsistently follows through, and this has contributed to long length of stay. Psychiatry consulted with recommendations for consistent boundaries and consideration of enrollment in [facility] program.   DVT prophylaxis: Heparin sq Code Status: Full code. Family Communication: No  family at bed side., Disposition Plan:   Discharge to SNF. Medically stable for discharge. Needs to tolerate HD while in a chair.   Consultants:  Orthopedic surgery Infectious disease Nephrology Vascular surgery Psychiatry  Procedures:  Vascular ultrasound duplex dialysis access 09/07/2021 Fistula revision branch ligation per Dr. Unk Lightning 09/19/2021- Left hip debridement, application of wound VAC by Dr. [name] 08/28/2021  Antimicrobials: Daptomycin Cefazolin   Subjective: Patient was seen and examined at bedside. Overnight events noted. Patient reports getting better, trying to participate in physical therapy.  She mentioned that she wants to stand before she can go home.  Objective: Vitals:   03/05/22 1433 03/05/22 1720 03/05/22 2111 03/06/22 0850  BP: (!) 182/65 (!) 156/59 (!) 156/59 (!) 151/95  Pulse: 73 67 72 66  Resp: 17 16 18 17   Temp: 98.5 F (36.9 C) 98.4 F (36.9 C) 98 F (36.7 C) 98.4 F (36.9 C)  TempSrc: Oral Oral Oral Oral  SpO2: 97% 98% 98% 100%  Weight:      Height:        Intake/Output Summary (Last 24 hours) at 03/06/2022 1141 Last data filed at 03/06/2022 0400 Gross per 24 hour  Intake 120 ml  Output 3600 ml  Net -3480 ml   Filed Weights   02/28/22 1212 03/02/22 0943 03/02/22 1258  Weight: 97.9 kg 100 kg 95.9 kg    Examination:  General exam: Appears comfortable, not in any acute distress.  Deconditioned Respiratory system: CTA bilaterally, respiratory effort normal, RR 13. Cardiovascular system: S1-S2 heard, regular rate and rhythm, no murmur. Gastrointestinal system: Abdomen is soft, non tender, non distended, BS+ Central nervous system: Alert and oriented x 3. No focal neurological deficits. Extremities: Left BKA. Skin: No rashes, lesions or ulcers Psychiatry: Judgement and insight appear normal. Mood & affect appropriate.     Data Reviewed: I have personally reviewed following labs and imaging studies  CBC: Recent Labs  Lab  02/27/22 1606 02/28/22 0845 03/02/22 0942 03/05/22 0930  WBC 6.5 7.0 6.2 8.0  HGB 10.0* 9.3* 9.5* 9.3*  HCT 30.3* 27.6* 28.7* 27.9*  MCV 89.9 88.2 89.1 88.9  PLT 213 183 191 373   Basic Metabolic Panel: Recent Labs  Lab 02/27/22 1530 02/28/22 0845 03/02/22 0942 03/05/22 0835  NA 126* 126* 128* 127*  K 5.9* 5.4* 4.8 5.4*  CL 89* 90* 93* 90*  CO2 21* 22 23 21*  GLUCOSE 124* 159* 181* 175*  BUN 125* 129* 96* 101*  CREATININE 7.72* 8.00* 6.50* 6.25*  CALCIUM 8.5* 8.4* 8.4* 8.7*  PHOS 6.3* 6.9* 6.5* 6.2*   GFR: Estimated Creatinine Clearance: 12.9 mL/min (A) (by C-G formula based on SCr of 6.25 mg/dL (H)). Liver Function Tests: Recent Labs  Lab 02/27/22 1530 02/28/22 0845 03/02/22 0942 03/05/22 0835  ALBUMIN 3.7 3.5 3.5 3.6   No results for input(s): "LIPASE", "AMYLASE" in the last 168 hours. No results for input(s): "AMMONIA" in the last 168 hours. Coagulation Profile: No results for input(s): "INR", "PROTIME" in the last 168 hours. Cardiac Enzymes: No results for input(s): "CKTOTAL", "CKMB", "CKMBINDEX", "TROPONINI" in the last 168 hours. BNP (last 3 results) No results for input(s): "PROBNP" in the last 8760 hours. HbA1C: No results for input(s): "HGBA1C" in the last 72 hours. CBG: Recent Labs  Lab 03/05/22 1414 03/05/22 1718 03/05/22 2118 03/06/22 0719 03/06/22 1118  GLUCAP 238* 195* 170* 104* 178*   Lipid Profile: No results for input(s): "CHOL", "HDL", "LDLCALC", "TRIG", "CHOLHDL", "LDLDIRECT" in the last 72 hours. Thyroid Function Tests:  No results for input(s): "TSH", "T4TOTAL", "FREET4", "T3FREE", "THYROIDAB" in the last 72 hours. Anemia Panel: No results for input(s): "VITAMINB12", "FOLATE", "FERRITIN", "TIBC", "IRON", "RETICCTPCT" in the last 72 hours. Sepsis Labs: No results for input(s): "PROCALCITON", "LATICACIDVEN" in the last 168 hours.  No results found for this or any previous visit (from the past 240 hour(s)).   Radiology  Studies: No results found.  Scheduled Meds:  (feeding supplement) PROSource Plus  30 mL Oral BID BM   alosetron  1 mg Oral BID   amitriptyline  50 mg Oral QHS   atorvastatin  40 mg Oral q1800   calcitRIOL  0.25 mcg Oral Daily   carvedilol  6.25 mg Oral BID   Chlorhexidine Gluconate Cloth  6 each Topical Q0600   darbepoetin (ARANESP) injection - DIALYSIS  60 mcg Subcutaneous Q Fri-1800   docusate sodium  200 mg Oral BID   DULoxetine  40 mg Oral Daily   fentaNYL  1 patch Transdermal Q72H   furosemide  80 mg Oral Daily   hydrALAZINE  50 mg Oral TID   insulin aspart  0-9 Units Subcutaneous TID WC   insulin aspart  6 Units Subcutaneous TID WC   insulin glargine-yfgn  10 Units Subcutaneous QHS   methocarbamol  500 mg Oral QID   mirtazapine  7.5 mg Oral QHS   multivitamin  1 tablet Oral QHS   pantoprazole  40 mg Oral Daily   sevelamer carbonate  2,400 mg Oral TID WC   sodium zirconium cyclosilicate  10 g Oral Once per day on Sun Tue Thu Sat   topiramate  12.5 mg Oral Daily   Continuous Infusions:  albumin human       LOS: 192 days    Time spent: 35 mins    Keval Nam, MD [facility]   If 7PM-7AM, please contact night-coverage

## 2022-03-06 NOTE — Progress Notes (Signed)
Physical Therapy Treatment Patient Details Name: [name] MRN: [id number] DOB: March 20, 1965 Today's Date: 03/06/2022   History of Present Illness 57 y.o. female presented to ED 6/26 from dialysis with increased bloody drainage from L hip wound. 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 s/p hardware removal of left hip with partial resection of tuft tissue and femure that were nonviable on 08/10/21 Discharged home 6/23. underwent L hip debridement and wound vac placement 6/28; +Left intertrochanteric non-union,  Fracture of left distal femur  PMH: hypertension, hyperlipidemia, ESRD on HD MWF, history of left BKA in 2021, depression/anxiety, stroke, tobacco use, T2DM,  insomnia, chronic pain syndrome.    PT Comments    Patient progressing slowly.  Some diarrhea today so refused mobility once in wheelchair, but did participate in some therex.  She was able to transfer via scooting on slide board with min A both ways today.  She also was happy to get a new shrinker she feels fits her better and was applied this session.  She also responded well to different therapist, though she did remember we worked together in inpatient rehab two years ago.  Seems to feel she would now like to be able to go to inpatient rehab, but not sure if insurance is the absolute barrier or not.  Will continue skilled PT during acute stay.  Hopeful for some progress to home pending possible planned family meeting on 1/6.   Recommendations for follow up therapy are one component of a multi-disciplinary discharge planning process, led by the attending physician.  Recommendations may be updated based on patient status, additional functional criteria and insurance authorization.  Follow Up Recommendations  Skilled nursing-short term rehab (<3 hours/day) Can patient physically be transported by private vehicle: No   Assistance Recommended at Discharge Intermittent Supervision/Assistance  Patient  can return home with the following A lot of help with bathing/dressing/bathroom;Assistance with cooking/housework;Assist for transportation;Two people to help with walking and/or transfers;Help with stairs or ramp for entrance   Equipment Recommendations  Other (comment);Hospital bed (slide board, drop arm BSC, wheelchair cushion, van transport for HD)    Recommendations for Other Services       Precautions / Restrictions Precautions Precautions: Fall Precaution Comments: L BKA (baseline) Restrictions LLE Weight Bearing: Weight bearing as tolerated Other Position/Activity Restrictions: L distal femur fx; applied new shrinker today provided from [employer]     Mobility  Bed Mobility Overal bed mobility: Needs Assistance Bed Mobility: Supine to Sit, Sit to Supine     Supine to sit: HOB elevated, Min assist Sit to supine: Min assist   General bed mobility comments: assist to scoot hips to EOB, to supine assist for positioning    Transfers Overall transfer level: Needs assistance Equipment used: Sliding board Transfers: Bed to chair/wheelchair/BSC            Lateral/Scoot Transfers: Min assist, With slide board General transfer comment: A to place new bed pad and slide board, pt scooting slowly with stop to rest with min A for helping with bed pad under her; made sure level to downhill both ways    Ambulation/Gait                   Stairs             Wheelchair Mobility    Modified Rankin (Stroke Patients Only)       Balance Overall balance assessment: Needs assistance Sitting-balance support: Feet supported Sitting balance-Leahy Scale: Good Sitting  balance - Comments: able to maintain balance while performing lateral scooting                                    Cognition Arousal/Alertness: Awake/alert Behavior During Therapy: WFL for tasks assessed/performed   Area of Impairment: Memory, Problem solving, Safety/judgement                      Memory: Decreased short-term memory   Safety/Judgement: Decreased awareness of deficits   Problem Solving: Slow processing, Requires verbal cues          Exercises Amputee Exercises Hip ABduction/ADduction: Strengthening, Both, 10 reps, Seated (squeezing pillow between knees in wheelchair) Chair Push Up: Strengthening, Both, 5 reps, Seated (then 3 reps)    General Comments General comments (skin integrity, edema, etc.): Patient voiced frustration she could not go to inpatient rehab this time due to "insurance denied".  Seems to understand she needs more consistency to progress.      Pertinent Vitals/Pain Pain Assessment Pain Assessment: Faces Faces Pain Scale: Hurts little more Pain Location: abdomen Pain Descriptors / Indicators: Discomfort, Grimacing Pain Intervention(s): Monitored during session    Home Living                          Prior Function            PT Goals (current goals can now be found in the care plan section) Progress towards PT goals: Progressing toward goals    Frequency    Min 2X/week      PT Plan Current plan remains appropriate    Co-evaluation              AM-PAC PT "6 Clicks" Mobility   Outcome Measure  Help needed turning from your back to your side while in a flat bed without using bedrails?: None Help needed moving from lying on your back to sitting on the side of a flat bed without using bedrails?: A Little Help needed moving to and from a bed to a chair (including a wheelchair)?: A Little Help needed standing up from a chair using your arms (e.g., wheelchair or bedside chair)?: Total Help needed to walk in hospital room?: Total Help needed climbing 3-5 steps with a railing? : Total 6 Click Score: 13    End of Session   Activity Tolerance: Patient tolerated treatment well Patient left: in bed;with call bell/phone within reach;with bed alarm set;Other (comment) (L LE positioned with towel  roll and positioning device)   PT Visit Diagnosis: Other abnormalities of gait and mobility (R26.89);Muscle weakness (generalized) (M62.81);History of falling (Z91.81)     Time: 1340-1420 PT Time Calculation (min) (ACUTE ONLY): 40 min  Charges:  $Therapeutic Exercise: 8-22 mins $Therapeutic Activity: 23-37 mins                     Magda Kiel, PT Acute Rehabilitation Services Office:[phone] 03/06/2022    Reginia Naas 03/06/2022, 6:03 PM

## 2022-03-07 DIAGNOSIS — M86252 Subacute osteomyelitis, left femur: Secondary | ICD-10-CM | POA: Diagnosis not present

## 2022-03-07 LAB — RENAL FUNCTION PANEL
Albumin: 3.8 g/dL (ref 3.5–5.0)
Anion gap: 15 (ref 5–15)
BUN: 70 mg/dL — ABNORMAL HIGH (ref 6–20)
CO2: 23 mmol/L (ref 22–32)
Calcium: 9 mg/dL (ref 8.9–10.3)
Chloride: 90 mmol/L — ABNORMAL LOW (ref 98–111)
Creatinine, Ser: 5.4 mg/dL — ABNORMAL HIGH (ref 0.44–1.00)
GFR, Estimated: 9 mL/min — ABNORMAL LOW (ref >60)
Glucose, Bld: 136 mg/dL — ABNORMAL HIGH (ref 70–99)
Phosphorus: 5.7 mg/dL — ABNORMAL HIGH (ref 2.5–4.6)
Potassium: 4.8 mmol/L (ref 3.5–5.1)
Sodium: 128 mmol/L — ABNORMAL LOW (ref 135–145)

## 2022-03-07 LAB — GLUCOSE, CAPILLARY
Glucose-Capillary: 93 mg/dL (ref 70–99)
Glucose-Capillary: 195 mg/dL — ABNORMAL HIGH (ref 70–99)
Glucose-Capillary: 216 mg/dL — ABNORMAL HIGH (ref 70–99)
Glucose-Capillary: 145 mg/dL — ABNORMAL HIGH (ref 70–99)

## 2022-03-07 LAB — IRON AND TIBC
Iron: 50 ug/dL (ref 28–170)
Saturation Ratios: 18 % (ref 10.4–31.8)
TIBC: 277 ug/dL (ref 250–450)
UIBC: 227 ug/dL

## 2022-03-07 LAB — FERRITIN: Ferritin: 309 ng/mL — ABNORMAL HIGH (ref 11–307)

## 2022-03-07 MED ORDER — HEPARIN SODIUM (PORCINE) 1000 UNIT/ML DIALYSIS
4000.0000 [IU] | INTRAMUSCULAR | Status: DC | PRN
  Administered 2022-03-07: 4000 [IU] via INTRAVENOUS_CENTRAL
  Filled 2022-03-07: qty 4

## 2022-03-07 MED ORDER — PENTAFLUOROPROP-TETRAFLUOROETH EX AERO: 1.0000 | INHALATION_SPRAY | CUTANEOUS | Status: DC | PRN

## 2022-03-07 MED ORDER — HEPARIN SODIUM (PORCINE) 1000 UNIT/ML DIALYSIS: 1000.0000 [IU] | INTRAMUSCULAR | Status: DC | PRN

## 2022-03-07 MED ORDER — ANTICOAGULANT SODIUM CITRATE 4% (200MG/5ML) IV SOLN: 5.0000 mL | Status: DC | PRN

## 2022-03-07 NOTE — Progress Notes (Signed)
   03/07/22 1345  Pain Assessment  Pain Scale 0-10  Pain Score 0  Fistula / Graft Left Upper arm Arteriovenous fistula  Placement Date/Time: 01/09/20 1121   Placed prior to admission: No  Orientation: (c) Left  Access Location: Upper arm  Access Type: Arteriovenous fistula  Site Condition No complications  Neurological  Level of Consciousness Alert  Orientation Level Oriented X4  Respiratory  Respiratory Pattern Regular;Unlabored  Chest Assessment Chest expansion symmetrical  Bilateral Breath Sounds Clear;Diminished  Cardiac  Pulse Regular  Cardiac Rhythm NSR   Received patient in bed to unit.  Alert and oriented.  Informed consent signed and in chart.   Treatment initiated: 0907a Treatment completed: 1348p  Patient tolerated well.  Transported back to the room  Alert, without acute distress.  Hand-off given to patient's nurse.   Access used: Yes   Access issues: None  Total UF removed: 4000 Medication(s) given: Yes Post HD VS: 98.1, 144/55, 66, 14, 97% R/A Post HD weight: 95.9 kg   [facility]

## 2022-03-07 NOTE — Procedures (Signed)
Seen and examined on dialysis.  Blood pressure 174/61 and HR 64.  Procedure supervised.  Tolerating goal.  Labs not yet available. Left avf in use. She has more fluid on today and I encouraged her to stay on for the goal of 4 kg.  She doesn't think that she can get more than that off.  I have encouraged dialysis in a chair.   Claudia Desanctis, MD 03/07/2022 9:37 AM

## 2022-03-07 NOTE — Progress Notes (Signed)
Mobility Specialist Progress Note:   03/07/22 1605  Mobility  Activity  (bed level exercises and stretches)  Range of Motion/Exercises Passive;Active;Left leg  Activity Response Tolerated well  Mobility Referral Yes  $Mobility charge 1 Mobility   Pt eager for mobility session. Tolerated daily stretches and exercises well. Pt left with all needs met, lab in room.   Nelta Numbers Mobility Specialist Please contact via SecureChat or  Rehab office at [phone]

## 2022-03-07 NOTE — Progress Notes (Signed)
Got a call from [name] pt is now refusing to come to hemodialysis

## 2022-03-07 NOTE — Progress Notes (Signed)
PROGRESS NOTE    [name]  RWE:[id number] DOB: May 14, 1965 DOA: 08/26/2021 PCP: [name], MD   Brief Narrative:  This 57 year old female with PMH significant of hypertension, hyperlipidemia, diabetes mellitus type 2, end-stage renal disease on hemodialysis, status post left BKA for chronic calcaneus osteomyelitis on 01/12/2020, bipolar disorder, left distal femoral fracture after a fall on 07/19/2021 deemed  nonoperative, admission from 08/03/2021-08/23/2021 for left femoral osteomyelitis requiring partial resection of femur and rearrangements of bony fragments with subsequent antibiotic treatment presented to the hospital with left hip bleeding and was found to have subacute osteomyelitis and abscess of left femur.  Orthopedic/ID were consulted and patient was started on broad-spectrum antibiotics.  Patient underwent multiple I&D's by Dr. [name] along with local tissue rearrangement.  Patient was also seen by vascular surgery who did fistula branch ligation.  Patient has completed 6-week course of daptomycin on 10/09/2021.  She has been unwilling to complete hemodialysis in chair and outpatient hemodialysis unit has been hard to find. She is otherwise medically stable for discharge.    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  Active Problems:   ESRD (end stage renal disease) on dialysis ([place])   Type 2 diabetes mellitus with hyperlipidemia (HCC)   History of CVA (cerebrovascular accident)   Essential hypertension   Anxiety and depression   GASTROESOPHAGEAL REFLUX, NO ESOPHAGITIS   TOBACCO DEPENDENCE   Obesity (BMI 30.0-34.9)   Subacute osteomyelitis of left femur with abscess: Infected left hip hardware s/p removal: Patient started on Cefazolin. Orthopedic surgery consulted. ID consulted. Patient underwent hardware removal on 6/10 (prior to admission) with I&D on 6/28 and 7/1. Infectious disease recommendations for 6 weeks of Daptomycin;  patient has completed course months ago. Stable.   History of left intertrochanteric fracture: This was previously managed with IM nail placement in 2021. Hardware removed secondary to above.   ESRD on HD Nephrology on board.  -HD per nephrology Patient needs to tolerate hemodialysis in the chair-- she is unwilling to do this   Anasarca: Improved with hemodialysis.   Hyponatremia: Management with hemodialysis.   Hypokalemia: Hyperkalemia: Management with hemodialysis   Anemia of chronic disease: Acute blood loss anemia: In setting of CKD in addition to blood loss from wound.  Patient has received a total of 2 units of PRBC via transfusion.  Hemoglobin stable.   Diabetes mellitus type 2: Diabetes is uncontrolled with hyperglycemia. Most recent hemoglobin A1C of 7.1%.  -insulin and SSI   Migraine headaches: Patient previously on Topomax for prophylaxis.  Patient with frequently recurrent migraine headaches this admission.  Patient feels that her headaches may be improved since starting Topamax. Continue Topamax 12.5 mg daily (renally dosed) and titrate as needed/safe   Essential HTN: Continue hydralazine and Coreg.   Hyperlipidemia History of CVA Continue Lipitor.   GERD Continue Pepcid and Protonix.   Tobacco use Tobacco cessation discussed during this admission.   History of left BKA: Noted.   Obesity Estimated body mass index is 30.34 kg/m as calculated from the following:   Height as of this encounter: 5\' 10"  (1.778 m).   Weight as of this encounter: 95.9 kg.   Debility Patient is not always consistent with HD secondary to subjective reasons. This also true with physical therapy- patient deciding when and how much to participate. Patient voices goals for improvement but inconsistently follows through, and this has contributed to long length of stay. Psychiatry consulted with recommendations for consistent  boundaries and consideration of enrollment in [facility]  program.   DVT prophylaxis: Heparin sq Code Status: Full code. Family Communication: No family at bed side., Disposition Plan:   Discharge to SN (patient reports she does not intend to go to SNF and instead go home). Medically stable for discharge. Needs to tolerate HD while in a chair.   Consultants:  Orthopedic surgery Infectious disease Nephrology Vascular surgery Psychiatry  Procedures:  Vascular ultrasound duplex dialysis access 09/07/2021 Fistula revision branch ligation per Dr. Unk Lightning 09/19/2021- Left hip debridement, application of wound VAC by Dr. [name] 08/28/2021  Antimicrobials: Daptomycin Cefazolin   Subjective: Willing to go to HD but after breakfast  Objective: Vitals:   03/05/22 2111 03/06/22 0850 03/06/22 1648 03/06/22 2105  BP:  (!) 151/95 (!) 142/55 (!) 159/54  Pulse: 72 66 64 69  Resp: 18 17 18 18   Temp:  98.4 F (36.9 C) 98.4 F (36.9 C) 97.6 F (36.4 C)  TempSrc: Oral Oral Oral Oral  SpO2: 98% 100% 100% 98%  Weight:      Height:        Intake/Output Summary (Last 24 hours) at 03/07/2022 0819 Last data filed at 03/07/2022 0240 Gross per 24 hour  Intake 720 ml  Output 350 ml  Net 370 ml   Filed Weights   02/28/22 1212 03/02/22 0943 03/02/22 1258  Weight: 97.9 kg 100 kg 95.9 kg    Examination:   General: Appearance:    Obese female in no acute distress     Lungs:     Clear to auscultation bilaterally, respirations unlabored  Heart:    Normal heart rate. Normal rhythm. No murmurs, rubs, or gallops.      Neurologic:   Awake, alert, oriented x 3. No apparent focal neurological           defect.        Data Reviewed: I have personally reviewed following labs and imaging studies  CBC: Recent Labs  Lab 02/28/22 0845 03/02/22 0942 03/05/22 0930  WBC 7.0 6.2 8.0  HGB 9.3* 9.5* 9.3*  HCT 27.6* 28.7* 27.9*  MCV 88.2 89.1 88.9  PLT 183 191 947   Basic Metabolic Panel: Recent Labs  Lab 02/28/22 0845 03/02/22 0942 03/05/22 0835   NA 126* 128* 127*  K 5.4* 4.8 5.4*  CL 90* 93* 90*  CO2 22 23 21*  GLUCOSE 159* 181* 175*  BUN 129* 96* 101*  CREATININE 8.00* 6.50* 6.25*  CALCIUM 8.4* 8.4* 8.7*  PHOS 6.9* 6.5* 6.2*   GFR: Estimated Creatinine Clearance: 12.9 mL/min (A) (by C-G formula based on SCr of 6.25 mg/dL (H)). Liver Function Tests: Recent Labs  Lab 02/28/22 0845 03/02/22 0942 03/05/22 0835  ALBUMIN 3.5 3.5 3.6   No results for input(s): "LIPASE", "AMYLASE" in the last 168 hours. No results for input(s): "AMMONIA" in the last 168 hours. Coagulation Profile: No results for input(s): "INR", "PROTIME" in the last 168 hours. Cardiac Enzymes: No results for input(s): "CKTOTAL", "CKMB", "CKMBINDEX", "TROPONINI" in the last 168 hours. BNP (last 3 results) No results for input(s): "PROBNP" in the last 8760 hours. HbA1C: No results for input(s): "HGBA1C" in the last 72 hours. CBG: Recent Labs  Lab 03/06/22 0719 03/06/22 1118 03/06/22 1649 03/06/22 2105 03/07/22 0708  GLUCAP 104* 178* 112* 217* 93   Lipid Profile: No results for input(s): "CHOL", "HDL", "LDLCALC", "TRIG", "CHOLHDL", "LDLDIRECT" in the last 72 hours. Thyroid Function Tests: No results for input(s): "TSH", "T4TOTAL", "FREET4", "T3FREE", "THYROIDAB" in the last  72 hours. Anemia Panel: No results for input(s): "VITAMINB12", "FOLATE", "FERRITIN", "TIBC", "IRON", "RETICCTPCT" in the last 72 hours. Sepsis Labs: No results for input(s): "PROCALCITON", "LATICACIDVEN" in the last 168 hours.  No results found for this or any previous visit (from the past 240 hour(s)).   Radiology Studies: No results found.  Scheduled Meds:  (feeding supplement) PROSource Plus  30 mL Oral BID BM   alosetron  1 mg Oral BID   amitriptyline  50 mg Oral QHS   atorvastatin  40 mg Oral q1800   calcitRIOL  0.25 mcg Oral Daily   carvedilol  6.25 mg Oral BID   Chlorhexidine Gluconate Cloth  6 each Topical Q0600   darbepoetin (ARANESP) injection - DIALYSIS   60 mcg Subcutaneous Q Fri-1800   docusate sodium  200 mg Oral BID   DULoxetine  40 mg Oral Daily   fentaNYL  1 patch Transdermal Q72H   furosemide  80 mg Oral Daily   hydrALAZINE  50 mg Oral TID   insulin aspart  0-9 Units Subcutaneous TID WC   insulin aspart  6 Units Subcutaneous TID WC   insulin glargine-yfgn  10 Units Subcutaneous QHS   methocarbamol  500 mg Oral QID   mirtazapine  7.5 mg Oral QHS   multivitamin  1 tablet Oral QHS   pantoprazole  40 mg Oral Daily   sevelamer carbonate  2,400 mg Oral TID WC   sodium zirconium cyclosilicate  10 g Oral Once per day on Sun Tue Thu Sat   topiramate  12.5 mg Oral Daily   Continuous Infusions:  albumin human     anticoagulant sodium citrate       LOS: 193 days    Time spent: 35 mins    Geradine Girt, DO [facility]   If 7PM-7AM, please contact night-coverage

## 2022-03-07 NOTE — Plan of Care (Signed)
Patient will not be seen by nephrology over the weekend. Please page with questions/concerns.   Next dialysis Monday 03/10/22  Lynnda Child PA-C [facility] Pager [phone] 03/07/2022,5:40 PM

## 2022-03-07 NOTE — Progress Notes (Addendum)
[facility] Progress Note   Subjective:    Seen in HD unit. Tolerating UF so far. No new concerns today   Objective Vitals:   03/07/22 0930 03/07/22 1000 03/07/22 1030 03/07/22 1100  BP: (!) 174/61 (!) 156/58 (!) 164/58 (!) 152/58  Pulse: 64 66 68 66  Resp: 12 15 14  (!) 9  Temp:      TempSrc:      SpO2: 98% 97% 98% 96%  Weight:      Height:        Filed Weights   03/02/22 0943 03/02/22 1258 03/07/22 0907  Weight: 100 kg 95.9 kg 99.4 kg    Intake/Output Summary (Last 24 hours) at 03/07/2022 1127 Last data filed at 03/07/2022 0841 Gross per 24 hour  Intake 820 ml  Output 350 ml  Net 470 ml     Additional Objective Labs: Basic Metabolic Panel: Recent Labs  Lab 03/02/22 0942 03/05/22 0835  NA 128* 127*  K 4.8 5.4*  CL 93* 90*  CO2 23 21*  GLUCOSE 181* 175*  BUN 96* 101*  CREATININE 6.50* 6.25*  CALCIUM 8.4* 8.7*  PHOS 6.5* 6.2*    Liver Function Tests: Recent Labs  Lab 03/02/22 0942 03/05/22 0835  ALBUMIN 3.5 3.6    No results for input(s): "LIPASE", "AMYLASE" in the last 168 hours. CBC: Recent Labs  Lab 03/02/22 0942 03/05/22 0930  WBC 6.2 8.0  HGB 9.5* 9.3*  HCT 28.7* 27.9*  MCV 89.1 88.9  PLT 191 221     Iron Studies: No results for input(s): "IRON", "TIBC", "TRANSFERRIN", "FERRITIN" in the last 72 hours. Lab Results  Component Value Date   INR 1.1 12/28/2019   INR 1.2 12/17/2018   INR 0.99 04/07/2017   Studies/Results: No results found.  Medications:  albumin human     anticoagulant sodium citrate      (feeding supplement) PROSource Plus  30 mL Oral BID BM   alosetron  1 mg Oral BID   amitriptyline  50 mg Oral QHS   atorvastatin  40 mg Oral q1800   calcitRIOL  0.25 mcg Oral Daily   carvedilol  6.25 mg Oral BID   Chlorhexidine Gluconate Cloth  6 each Topical Q0600   darbepoetin (ARANESP) injection - DIALYSIS  60 mcg Subcutaneous Q Fri-1800   docusate sodium  200 mg Oral BID   DULoxetine  40 mg Oral Daily    fentaNYL  1 patch Transdermal Q72H   furosemide  80 mg Oral Daily   hydrALAZINE  50 mg Oral TID   insulin aspart  0-9 Units Subcutaneous TID WC   insulin aspart  6 Units Subcutaneous TID WC   insulin glargine-yfgn  10 Units Subcutaneous QHS   methocarbamol  500 mg Oral QID   mirtazapine  7.5 mg Oral QHS   multivitamin  1 tablet Oral QHS   pantoprazole  40 mg Oral Daily   sevelamer carbonate  2,400 mg Oral TID WC   sodium zirconium cyclosilicate  10 g Oral Once per day on Sun Tue Thu Sat   topiramate  12.5 mg Oral Daily    Physical Exam General: [name] Heart: RRR, no murmurs, rubs or gallops Lungs: CTA bilaterally Abdomen: Soft, non-distended, +BS Extremities 1+ pitting edema bilateral lower extremities Dialysis Access: RUE AVF (+) B/T  Dialysis Orders: MWF at [facility], 400/500, EDW 90kg, 2K/2Ca, LUE AVF - No heparin prior to admission, no ESA  Assessment/Plan: 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 Completed 6 wk course of IV Daptomycin on 10/12/21. Per Dr. Jess Barters note 12/28/2021 "NO RESTRICTIONS L HIP".  Chronic L hip/residual limb pain: Hx fractures of L femoral neck (where hardware was removed) as well as distal femur Fx. Pain control per primary.  ESRD: Usual MWF schedule. Previously AVF infiltrated but is working well as of now, is back on regular MWF schedule. Clots HD system without heparin, 4000 unit IV bolus and Heparin 2000 units IV mid-run bolus TIW.  Hyperkalemia: Stable on scheduled lokelma 4 days a week. HTN/volume: Large fluids gains between HD which has been discussed extensively, Ongoing edema bilateral lower extremity, Na low and BP high, all pointing to excess volume. Last full HD was 12/22. Continue to use max UF with HD-plan for UFG 4-5L on HD. BP meds previously tapered down. If BP doesn't improve after HD, may need to titrate BO meds back up. Anemia of ESRD: Hb trending down. Last tsat 54% from  Oct.  Checking Fe studies. Aranesp 60 q Friday -last 12/29. recently resumed.  Secondary HPTH: CorrCa ok, no VDRA. Phos 6.2, continue Renvela as binder. Needs diet compliance. Nutrition: continue protein supplements.  Renal diet w/fluid restrictions.   AVF dysfunction (resolved): S/p branch ligation 09/19/2021  DM2: Insulin per primary.   Dispo: SNF recommended, but she declined. Not home HD candidate. Last dialyzed in recliner on 11/04/21.  Continues to refuse dialysis in recliner. Continues to refuse various aspects of care. Per Renal Navigator notes there are no clinics that will accept patient unless she will have HD in chair. Ultimately patient is making her own choices that are prolonging hospitalization.   Lynnda Child PA-C [facility] 03/07/2022,11:27 AM   Seen and examined independently.  Agree with note and exam as documented above by physician extender and as noted here.  See my procedure note from today.  Not currently a candidate for outpatient dialysis as she will not dialyze in a chair.  Little progress over the past few months.  Would encourage goals of care discussions  Claudia Desanctis, MD 03/07/2022  11:57 AM

## 2022-03-08 DIAGNOSIS — Z8673 Personal history of transient ischemic attack (TIA), and cerebral infarction without residual deficits: Secondary | ICD-10-CM | POA: Diagnosis not present

## 2022-03-08 DIAGNOSIS — E1169 Type 2 diabetes mellitus with other specified complication: Secondary | ICD-10-CM | POA: Diagnosis not present

## 2022-03-08 DIAGNOSIS — M86252 Subacute osteomyelitis, left femur: Secondary | ICD-10-CM | POA: Diagnosis not present

## 2022-03-08 DIAGNOSIS — N186 End stage renal disease: Secondary | ICD-10-CM | POA: Diagnosis not present

## 2022-03-08 LAB — GLUCOSE, CAPILLARY
Glucose-Capillary: 122 mg/dL — ABNORMAL HIGH (ref 70–99)
Glucose-Capillary: 155 mg/dL — ABNORMAL HIGH (ref 70–99)
Glucose-Capillary: 174 mg/dL — ABNORMAL HIGH (ref 70–99)
Glucose-Capillary: 203 mg/dL — ABNORMAL HIGH (ref 70–99)

## 2022-03-08 NOTE — Progress Notes (Signed)
PROGRESS NOTE    ANEVAY Martinez  JJO:[id number] DOB: 10/10/1965 DOA: 08/26/2021 PCP: [name], MD   Brief Narrative: This 57 year old female with PMH significant of hypertension, hyperlipidemia, diabetes mellitus type 2, end-stage renal disease on hemodialysis, status post left BKA for chronic calcaneus osteomyelitis on 01/12/2020, bipolar disorder, left distal femoral fracture after a fall on 07/19/2021 deemed  nonoperative, admission from 08/03/2021-08/23/2021 for left femoral osteomyelitis requiring partial resection of femur and rearrangements of bony fragments with subsequent antibiotic treatment presented to the hospital with left hip bleeding and was found to have subacute osteomyelitis and abscess of left femur.  Orthopedic/ID were consulted and patient was started on broad-spectrum antibiotics.  Patient underwent multiple I&D's by Dr. [name] along with local tissue rearrangement.  Patient was also seen by vascular surgery who did fistula branch ligation.  Patient has completed 6-week course of daptomycin on 10/09/2021.  She has been unable to complete hemodialysis in chair and outpatient hemodialysis unit has been hard to find. She is otherwise medically stable for discharge.      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 Orthopedic surgery consulted. ID consulted. Patient underwent hardware removal on 6/10 (prior to admission) with I&D on 6/28 and 7/1. Infectious disease recommendations for 6 weeks of Daptomycin; patient has completed course. Stable.  History of left intertrochanteric fracture This was previously managed with IM nail placement in 2021. Hardware removed secondary to above.  ESRD on HD Nephrology on board.  -HD per nephrology  Anasarca Improved with hemodialysis.  Hyponatremia Management with hemodialysis.  Hypokalemia Hyperkalemia Management with  hemodialysis  Anemia of chronic disease Acute blood loss anemia In setting of CKD in addition to blood loss from wound. Patient has received a total of 2 units of PRBC via transfusion. Hemoglobin stable.  Diabetes mellitus type 2 Diabetes is uncontrolled with hyperglycemia. Most recent hemoglobin A1C of 7.1%. Patient is on insulin glargine and insulin aspart as an outpatient. -Continue Semglee 10 units qHS, Novolog 6 units TID with meals and SSI  Migraine headaches Improved since restarting Topamax -Continue Topamax 12.5 mg daily (renally dosed) and titrate as needed/safe  Primary hypertension -Continue hydralazine and Coreg  Hyperlipidemia History of CVA -Continue Lipitor  GERD -Continue Pepcid and Protonix  Tobacco use Tobacco cessation discussed during this admission.  History of left BKA Noted.  Debility Patient is not always consistent with HD secondary to subjective reasons. This also affects physical therapy with patient deciding when and how much to participate. Patient voices goals for improvement but inconsistently follows through, and this has contributed to long length of stay. Psychiatry consulted with recommendations for consistent boundaries and consideration of enrollment in [facility] program.   DVT prophylaxis: Heparin subq Code Status:   Code Status: Full Code Family Communication: None at bedside Disposition Plan: Discharge to SNF. Medically stable for discharge. Needs to tolerate HD while in a chair.   Consultants:  Orthopedic surgery Infectious disease Nephrology Vascular surgery Psychiatry  Procedures:  Vascular ultrasound duplex dialysis access 09/07/2021 Fistula revision branch ligation per Dr. Unk Lightning 09/19/2021- Left hip debridement, application of wound VAC by Dr. [name] 08/28/2021  Antimicrobials: Daptomycin Cefazolin   Subjective: Still with some neuropathy pain. No other medical concerns today.  Objective: BP (!) 126/53 (BP Location:  Right Arm)   Pulse 68   Temp 98.2 F (36.8 C) (Oral)   Resp 17   Ht 5\' 10"  (1.778 m)  Wt 95.9 kg   SpO2 99%   BMI 30.34 kg/m   Examination:  General exam: Appears calm and comfortable Respiratory system: Respiratory effort normal. Central nervous system: Alert and oriented. Psychiatry: Judgement and insight appear normal. Mood & affect appropriate.   Data Reviewed: I have personally reviewed following labs and imaging studies  CBC Lab Results  Component Value Date   WBC 8.0 03/05/2022   RBC 3.14 (L) 03/05/2022   HGB 9.3 (L) 03/05/2022   HCT 27.9 (L) 03/05/2022   MCV 88.9 03/05/2022   MCH 29.6 03/05/2022   PLT 221 03/05/2022   MCHC 33.3 03/05/2022   RDW 13.4 03/05/2022   LYMPHSABS 1.8 02/03/2022   MONOABS 0.9 02/03/2022   EOSABS 0.2 02/03/2022   BASOSABS 0.1 17/91/5056     Last metabolic panel Lab Results  Component Value Date   NA 128 (L) 03/07/2022   K 4.8 03/07/2022   CL 90 (L) 03/07/2022   CO2 23 03/07/2022   BUN 70 (H) 03/07/2022   CREATININE 5.40 (H) 03/07/2022   GLUCOSE 136 (H) 03/07/2022   GFRNONAA 9 (L) 03/07/2022   GFRAA 13 (L) 12/17/2018   CALCIUM 9.0 03/07/2022   PHOS 5.7 (H) 03/07/2022   PROT 7.6 12/02/2021   ALBUMIN 3.8 03/07/2022   LABGLOB 3.1 10/29/2018   AGRATIO 1.4 10/29/2018   BILITOT 0.6 12/02/2021   ALKPHOS 105 12/02/2021   AST 16 12/02/2021   ALT 15 12/02/2021   ANIONGAP 15 03/07/2022    GFR: Estimated Creatinine Clearance: 14.9 mL/min (A) (by C-G formula based on SCr of 5.4 mg/dL (H)).  No results found for this or any previous visit (from the past 240 hour(s)).    Radiology Studies: No results found.    LOS: 194 days    Cordelia Poche, MD [facility] 03/08/2022, 11:32 AM   If 7PM-7AM, please contact night-coverage [email or web address]

## 2022-03-09 DIAGNOSIS — Z8673 Personal history of transient ischemic attack (TIA), and cerebral infarction without residual deficits: Secondary | ICD-10-CM | POA: Diagnosis not present

## 2022-03-09 DIAGNOSIS — E1169 Type 2 diabetes mellitus with other specified complication: Secondary | ICD-10-CM | POA: Diagnosis not present

## 2022-03-09 DIAGNOSIS — N186 End stage renal disease: Secondary | ICD-10-CM | POA: Diagnosis not present

## 2022-03-09 DIAGNOSIS — M86252 Subacute osteomyelitis, left femur: Secondary | ICD-10-CM | POA: Diagnosis not present

## 2022-03-09 NOTE — Progress Notes (Signed)
PROGRESS NOTE    [name]  WUJ:[id number] DOB: 16-Sep-1965 DOA: 08/26/2021 PCP: [name], MD   Brief Narrative: This 57 year old female with PMH significant of hypertension, hyperlipidemia, diabetes mellitus type 2, end-stage renal disease on hemodialysis, status post left BKA for chronic calcaneus osteomyelitis on 01/12/2020, bipolar disorder, left distal femoral fracture after a fall on 07/19/2021 deemed  nonoperative, admission from 08/03/2021-08/23/2021 for left femoral osteomyelitis requiring partial resection of femur and rearrangements of bony fragments with subsequent antibiotic treatment presented to the hospital with left hip bleeding and was found to have subacute osteomyelitis and abscess of left femur.  Orthopedic/ID were consulted and patient was started on broad-spectrum antibiotics.  Patient underwent multiple I&D's by Dr. [name] along with local tissue rearrangement.  Patient was also seen by vascular surgery who did fistula branch ligation.  Patient has completed 6-week course of daptomycin on 10/09/2021.  She has been unable to complete hemodialysis in chair and outpatient hemodialysis unit has been hard to find. She is otherwise medically stable for discharge.      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 Orthopedic surgery consulted. ID consulted. Patient underwent hardware removal on 6/10 (prior to admission) with I&D on 6/28 and 7/1. Infectious disease recommendations for 6 weeks of Daptomycin; patient has completed course. Stable.  History of left intertrochanteric fracture This was previously managed with IM nail placement in 2021. Hardware removed secondary to above.  ESRD on HD Nephrology on board.  -HD per nephrology  Anasarca Improved with hemodialysis.  Hyponatremia Management with hemodialysis.  Hypokalemia Hyperkalemia Management with  hemodialysis  Anemia of chronic disease Acute blood loss anemia In setting of CKD in addition to blood loss from wound. Patient has received a total of 2 units of PRBC via transfusion. Hemoglobin stable.  Diabetes mellitus type 2 Diabetes is uncontrolled with hyperglycemia. Most recent hemoglobin A1C of 7.1%. Patient is on insulin glargine and insulin aspart as an outpatient. -Continue Semglee 10 units qHS, Novolog 6 units TID with meals and SSI  Migraine headaches Improved since restarting Topamax -Continue Topamax 12.5 mg daily (renally dosed) and titrate as needed/safe  Primary hypertension -Continue hydralazine and Coreg  Hyperlipidemia History of CVA -Continue Lipitor  GERD -Continue Pepcid and Protonix  Tobacco use Tobacco cessation discussed during this admission.  History of left BKA Noted.  Debility Patient is not always consistent with HD secondary to subjective reasons. This also affects physical therapy with patient deciding when and how much to participate. Patient voices goals for improvement but inconsistently follows through, and this has contributed to long length of stay. Psychiatry consulted with recommendations for consistent boundaries and consideration of enrollment in [facility] program.   DVT prophylaxis: Heparin subq Code Status:   Code Status: Full Code Family Communication: None at bedside Disposition Plan: Discharge to SNF. Medically stable for discharge. Needs to tolerate HD while in a chair.   Consultants:  Orthopedic surgery Infectious disease Nephrology Vascular surgery Psychiatry  Procedures:  Vascular ultrasound duplex dialysis access 09/07/2021 Fistula revision branch ligation per Dr. Unk Lightning 09/19/2021- Left hip debridement, application of wound VAC by Dr. [name] 08/28/2021  Antimicrobials: Daptomycin Cefazolin   Subjective: No concerns today.  Objective: BP (!) 141/83   Pulse 64   Temp 98 F (36.7 C) (Oral)   Resp 17   Ht 5'  10" (1.778 m)   Wt 95.9 kg   SpO2 98%   BMI  30.34 kg/m   Examination:  General exam: Appears calm and comfortable   CBC Lab Results  Component Value Date   WBC 8.0 03/05/2022   RBC 3.14 (L) 03/05/2022   HGB 9.3 (L) 03/05/2022   HCT 27.9 (L) 03/05/2022   MCV 88.9 03/05/2022   MCH 29.6 03/05/2022   PLT 221 03/05/2022   MCHC 33.3 03/05/2022   RDW 13.4 03/05/2022   LYMPHSABS 1.8 02/03/2022   MONOABS 0.9 02/03/2022   EOSABS 0.2 02/03/2022   BASOSABS 0.1 07/86/7544     Last metabolic panel Lab Results  Component Value Date   NA 128 (L) 03/07/2022   K 4.8 03/07/2022   CL 90 (L) 03/07/2022   CO2 23 03/07/2022   BUN 70 (H) 03/07/2022   CREATININE 5.40 (H) 03/07/2022   GLUCOSE 136 (H) 03/07/2022   GFRNONAA 9 (L) 03/07/2022   GFRAA 13 (L) 12/17/2018   CALCIUM 9.0 03/07/2022   PHOS 5.7 (H) 03/07/2022   PROT 7.6 12/02/2021   ALBUMIN 3.8 03/07/2022   LABGLOB 3.1 10/29/2018   AGRATIO 1.4 10/29/2018   BILITOT 0.6 12/02/2021   ALKPHOS 105 12/02/2021   AST 16 12/02/2021   ALT 15 12/02/2021   ANIONGAP 15 03/07/2022    GFR: Estimated Creatinine Clearance: 14.9 mL/min (A) (by C-G formula based on SCr of 5.4 mg/dL (H)).  No results found for this or any previous visit (from the past 240 hour(s)).    Radiology Studies: No results found.    LOS: 195 days    Cordelia Poche, MD [facility] 03/09/2022, 11:41 AM   If 7PM-7AM, please contact night-coverage [email or web address]

## 2022-03-10 DIAGNOSIS — E1169 Type 2 diabetes mellitus with other specified complication: Secondary | ICD-10-CM | POA: Diagnosis not present

## 2022-03-10 DIAGNOSIS — Z8673 Personal history of transient ischemic attack (TIA), and cerebral infarction without residual deficits: Secondary | ICD-10-CM | POA: Diagnosis not present

## 2022-03-10 DIAGNOSIS — N186 End stage renal disease: Secondary | ICD-10-CM | POA: Diagnosis not present

## 2022-03-10 DIAGNOSIS — M86252 Subacute osteomyelitis, left femur: Secondary | ICD-10-CM | POA: Diagnosis not present

## 2022-03-10 LAB — CBC
HCT: 29.3 % — ABNORMAL LOW (ref 36.0–46.0)
Hemoglobin: 9.6 g/dL — ABNORMAL LOW (ref 12.0–15.0)
MCH: 29.7 pg (ref 26.0–34.0)
MCHC: 32.8 g/dL (ref 30.0–36.0)
MCV: 90.7 fL (ref 80.0–100.0)
Platelets: 208 10*3/uL (ref 150–400)
RBC: 3.23 MIL/uL — ABNORMAL LOW (ref 3.87–5.11)
RDW: 14 % (ref 11.5–15.5)
WBC: 8.3 10*3/uL (ref 4.0–10.5)
nRBC: 0 % (ref 0.0–0.2)

## 2022-03-10 LAB — RENAL FUNCTION PANEL
Albumin: 3.5 g/dL (ref 3.5–5.0)
Anion gap: 14 (ref 5–15)
BUN: 86 mg/dL — ABNORMAL HIGH (ref 6–20)
CO2: 24 mmol/L (ref 22–32)
Calcium: 8.7 mg/dL — ABNORMAL LOW (ref 8.9–10.3)
Chloride: 90 mmol/L — ABNORMAL LOW (ref 98–111)
Creatinine, Ser: 5.93 mg/dL — ABNORMAL HIGH (ref 0.44–1.00)
GFR, Estimated: 8 mL/min — ABNORMAL LOW (ref >60)
Glucose, Bld: 182 mg/dL — ABNORMAL HIGH (ref 70–99)
Phosphorus: 5.9 mg/dL — ABNORMAL HIGH (ref 2.5–4.6)
Potassium: 5.2 mmol/L — ABNORMAL HIGH (ref 3.5–5.1)
Sodium: 128 mmol/L — ABNORMAL LOW (ref 135–145)

## 2022-03-10 LAB — GLUCOSE, CAPILLARY
Glucose-Capillary: 100 mg/dL — ABNORMAL HIGH (ref 70–99)
Glucose-Capillary: 127 mg/dL — ABNORMAL HIGH (ref 70–99)
Glucose-Capillary: 153 mg/dL — ABNORMAL HIGH (ref 70–99)
Glucose-Capillary: 211 mg/dL — ABNORMAL HIGH (ref 70–99)
Glucose-Capillary: 112 mg/dL — ABNORMAL HIGH (ref 70–99)
Glucose-Capillary: 169 mg/dL — ABNORMAL HIGH (ref 70–99)
Glucose-Capillary: 322 mg/dL — ABNORMAL HIGH (ref 70–99)
Glucose-Capillary: 206 mg/dL — ABNORMAL HIGH (ref 70–99)

## 2022-03-10 MED ORDER — HEPARIN SODIUM (PORCINE) 1000 UNIT/ML IJ SOLN
INTRAMUSCULAR | Status: AC
  Administered 2022-03-10: 2000 [IU] via INTRAVENOUS
  Filled 2022-03-10: qty 2

## 2022-03-10 MED ORDER — HEPARIN SODIUM (PORCINE) 1000 UNIT/ML IJ SOLN: 2000.0000 [IU] | Freq: Once | INTRAMUSCULAR | Status: AC

## 2022-03-10 MED ORDER — HEPARIN SODIUM (PORCINE) 1000 UNIT/ML IJ SOLN
INTRAMUSCULAR | Status: AC
  Administered 2022-03-10: 4000 [IU] via INTRAVENOUS
  Filled 2022-03-10: qty 4

## 2022-03-10 MED ORDER — HEPARIN SODIUM (PORCINE) 1000 UNIT/ML IJ SOLN: 4000.0000 [IU] | Freq: Once | INTRAMUSCULAR | Status: AC

## 2022-03-10 NOTE — Progress Notes (Signed)
Subjective: Seen on hemodialysis and no complaints, currently tolerating UF.  Patient states she has conference with daughter social worker and [facility] RNs about potential discharge on Wednesday.  Does not want to do dialysis recliner today maybe on Wednesday per her request  Objective Vital signs in last 24 hours: Vitals:   03/10/22 1100 03/10/22 1130 03/10/22 1200 03/10/22 1230  BP: (!) 144/55 (!) 138/56 (!) 139/58 (!) 155/59  Pulse: (!) 58 61 63 62  Resp: 17 11 12 11   Temp:      TempSrc:      SpO2: 99% 97% 98% 97%  Weight:      Height:       Weight change:   Physical Exam General: [name] Heart: RRR, no MRG Lungs: CTA bilaterally, nonlabored Abdomen: Soft , +BS, NTND Extremities 1+ pitting edema bilateral lower extremities Dialysis Access: RUE AVF patent on HD   Dialysis Orders: MWF at [place], 400/500, EDW 90kg, 2K/2Ca, LUE AVF - No heparin prior to admission, no ESA   Assessment/Plan: 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 Completed 6 wk course of IV Daptomycin on 10/12/21. Per Dr. Jess Barters note 12/28/2021 "NO RESTRICTIONS L HIP".  Chronic L hip/residual limb pain: Hx fractures of L femoral neck (where hardware was removed) as well as distal femur Fx. Pain control per primary.  ESRD: Usual MWF schedule. Previously AVF infiltrated but is working well as of now, is back on regular MWF schedule. Clots HD system without heparin, 4000 unit IV bolus and Heparin 2000 units IV mid-run bolus TIW.  Hyperkalemia: Stable on scheduled lokelma 4 days a week. HTN/volume: Large fluids gains between HD which has been discussed extensively, Ongoing edema bilateral lower extremity, Na low 128 and BP stable currently has been high in the past proved with UF,   Continue to use max UF with HD-plan for UFG 4-5L on HD. BP meds previously tapered down. If BP doesn't improve after HD, may need to titrate BP meds back up. Anemia of ESRD: Hb  trending down. Last tsat 54% from Oct.  Checking Fe studies. Aranesp 60 q Friday -last 12/29. recently resumed.  Secondary HPTH: CorrCa ok, no VDRA. Phos 5.9, continue Renvela as binder. Needs diet compliance. Nutrition: ALB 3.5, continue protein supplements.  Renal diet w/fluid restrictions.   AVF dysfunction (resolved): S/p branch ligation 09/19/2021  DM2: Insulin per primary.   Dispo: SNF recommended, but she declined. Not home HD candidate. Last dialyzed in recliner on 11/04/21.  Continues to refuse dialysis in recliner. Continues to refuse various aspects of care. Per Renal Navigator notes there are no clinics that will accept patient unless she will have HD in chair. Ultimately patient is making her own choices that are prolonging hospitalization.  Per patient has conference this Wednesday about possible discharge plans  Ernest Haber, PA-C [facility] [phone] 03/10/2022,12:52 PM  LOS: 196 days   Labs: Basic Metabolic Panel: Recent Labs  Lab 03/05/22 0835 03/07/22 1050 03/10/22 0838  NA 127* 128* 128*  K 5.4* 4.8 5.2*  CL 90* 90* 90*  CO2 21* 23 24  GLUCOSE 175* 136* 182*  BUN 101* 70* 86*  CREATININE 6.25* 5.40* 5.93*  CALCIUM 8.7* 9.0 8.7*  PHOS 6.2* 5.7* 5.9*   Liver Function Tests: Recent Labs  Lab 03/05/22 0835 03/07/22 1050 03/10/22 0838  ALBUMIN 3.6 3.8 3.5   No results for input(s): "LIPASE", "AMYLASE" in the last 168 hours. No results for  input(s): "AMMONIA" in the last 168 hours. CBC: Recent Labs  Lab 03/05/22 0930 03/10/22 0838  WBC 8.0 8.3  HGB 9.3* 9.6*  HCT 27.9* 29.3*  MCV 88.9 90.7  PLT 221 208   Cardiac Enzymes: No results for input(s): "CKTOTAL", "CKMB", "CKMBINDEX", "TROPONINI" in the last 168 hours. CBG: Recent Labs  Lab 03/09/22 0721 03/09/22 1117 03/09/22 1623 03/09/22 2011 03/10/22 0718  GLUCAP 100* 127* 153* 211* 112*    Studies/Results: No results found. Medications:  albumin human      (feeding  supplement) PROSource Plus  30 mL Oral BID BM   alosetron  1 mg Oral BID   amitriptyline  50 mg Oral QHS   atorvastatin  40 mg Oral q1800   calcitRIOL  0.25 mcg Oral Daily   carvedilol  6.25 mg Oral BID   Chlorhexidine Gluconate Cloth  6 each Topical Q0600   darbepoetin (ARANESP) injection - DIALYSIS  60 mcg Subcutaneous Q Fri-1800   docusate sodium  200 mg Oral BID   DULoxetine  40 mg Oral Daily   fentaNYL  1 patch Transdermal Q72H   furosemide  80 mg Oral Daily   hydrALAZINE  50 mg Oral TID   insulin aspart  0-9 Units Subcutaneous TID WC   insulin aspart  6 Units Subcutaneous TID WC   insulin glargine-yfgn  10 Units Subcutaneous QHS   methocarbamol  500 mg Oral QID   mirtazapine  7.5 mg Oral QHS   multivitamin  1 tablet Oral QHS   pantoprazole  40 mg Oral Daily   sevelamer carbonate  2,400 mg Oral TID WC   sodium zirconium cyclosilicate  10 g Oral Once per day on Sun Tue Thu Sat   topiramate  12.5 mg Oral Daily

## 2022-03-10 NOTE — Progress Notes (Signed)
PROGRESS NOTE    [name]  QXI:[id number] DOB: 1965/10/26 DOA: 08/26/2021 PCP: [name], MD   Brief Narrative: This 57 year old female with PMH significant of hypertension, hyperlipidemia, diabetes mellitus type 2, end-stage renal disease on hemodialysis, status post left BKA for chronic calcaneus osteomyelitis on 01/12/2020, bipolar disorder, left distal femoral fracture after a fall on 07/19/2021 deemed  nonoperative, admission from 08/03/2021-08/23/2021 for left femoral osteomyelitis requiring partial resection of femur and rearrangements of bony fragments with subsequent antibiotic treatment presented to the hospital with left hip bleeding and was found to have subacute osteomyelitis and abscess of left femur.  Orthopedic/ID were consulted and patient was started on broad-spectrum antibiotics.  Patient underwent multiple I&D's by Dr. [name] along with local tissue rearrangement.  Patient was also seen by vascular surgery who did fistula branch ligation.  Patient has completed 6-week course of daptomycin on 10/09/2021.  She has been unable to complete hemodialysis in chair and outpatient hemodialysis unit has been hard to find. She is otherwise medically stable for discharge.      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 Orthopedic surgery consulted. ID consulted. Patient underwent hardware removal on 6/10 (prior to admission) with I&D on 6/28 and 7/1. Infectious disease recommendations for 6 weeks of Daptomycin; patient has completed course. Stable.  History of left intertrochanteric fracture This was previously managed with IM nail placement in 2021. Hardware removed secondary to above.  ESRD on HD Nephrology on board.  -HD per nephrology  Anasarca Improved with hemodialysis.  Hyponatremia Management with hemodialysis.  Hypokalemia Hyperkalemia Management with  hemodialysis  Anemia of chronic disease Acute blood loss anemia In setting of CKD in addition to blood loss from wound. Patient has received a total of 2 units of PRBC via transfusion. Hemoglobin stable.  Diabetes mellitus type 2 Diabetes is uncontrolled with hyperglycemia. Most recent hemoglobin A1C of 7.1%. Patient is on insulin glargine and insulin aspart as an outpatient. -Continue Semglee 10 units qHS, Novolog 6 units TID with meals and SSI  Migraine headaches Improved since restarting Topamax -Continue Topamax 12.5 mg daily (renally dosed) and titrate as needed/safe  Primary hypertension -Continue hydralazine and Coreg  Hyperlipidemia History of CVA -Continue Lipitor  GERD -Continue Pepcid and Protonix  Tobacco use Tobacco cessation discussed during this admission.  History of left BKA Noted.  Debility Patient is not always consistent with HD secondary to subjective reasons. This also affects physical therapy with patient deciding when and how much to participate. Patient voices goals for improvement but inconsistently follows through, and this has contributed to long length of stay. Psychiatry consulted with recommendations for consistent boundaries and consideration of enrollment in [facility] program.   DVT prophylaxis: Heparin subq Code Status:   Code Status: Full Code Family Communication: None at bedside Disposition Plan: Discharge to SNF. Medically stable for discharge. Needs to tolerate HD while in a chair.   Consultants:  Orthopedic surgery Infectious disease Nephrology Vascular surgery Psychiatry  Procedures:  Vascular ultrasound duplex dialysis access 09/07/2021 Fistula revision branch ligation per Dr. Unk Lightning 09/19/2021- Left hip debridement, application of wound VAC by Dr. [name] 08/28/2021  Antimicrobials: Daptomycin Cefazolin   Subjective: No issues this morning other than feeling tired. Currently in HD.  Objective: BP (!) 155/59 (BP Location:  Right Arm)   Pulse 62   Temp 98.2 F (36.8 C) (Oral)   Resp 11   Ht 5\' 10"  (1.778 m)  Wt 100.9 kg   SpO2 97%   BMI 31.92 kg/m   Examination:  General: Well appearing, no distress   CBC Lab Results  Component Value Date   WBC 8.3 03/10/2022   RBC 3.23 (L) 03/10/2022   HGB 9.6 (L) 03/10/2022   HCT 29.3 (L) 03/10/2022   MCV 90.7 03/10/2022   MCH 29.7 03/10/2022   PLT 208 03/10/2022   MCHC 32.8 03/10/2022   RDW 14.0 03/10/2022   LYMPHSABS 1.8 02/03/2022   MONOABS 0.9 02/03/2022   EOSABS 0.2 02/03/2022   BASOSABS 0.1 85/04/7739     Last metabolic panel Lab Results  Component Value Date   NA 128 (L) 03/10/2022   K 5.2 (H) 03/10/2022   CL 90 (L) 03/10/2022   CO2 24 03/10/2022   BUN 86 (H) 03/10/2022   CREATININE 5.93 (H) 03/10/2022   GLUCOSE 182 (H) 03/10/2022   GFRNONAA 8 (L) 03/10/2022   GFRAA 13 (L) 12/17/2018   CALCIUM 8.7 (L) 03/10/2022   PHOS 5.9 (H) 03/10/2022   PROT 7.6 12/02/2021   ALBUMIN 3.5 03/10/2022   LABGLOB 3.1 10/29/2018   AGRATIO 1.4 10/29/2018   BILITOT 0.6 12/02/2021   ALKPHOS 105 12/02/2021   AST 16 12/02/2021   ALT 15 12/02/2021   ANIONGAP 14 03/10/2022    GFR: Estimated Creatinine Clearance: 14 mL/min (A) (by C-G formula based on SCr of 5.93 mg/dL (H)).  No results found for this or any previous visit (from the past 240 hour(s)).    Radiology Studies: No results found.    LOS: 196 days    Cordelia Poche, MD [facility] 03/10/2022, 1:00 PM   If 7PM-7AM, please contact night-coverage [email or web address]

## 2022-03-10 NOTE — Procedures (Signed)
HD Note:  Some information was entered later than the data was gathered due to patient care needs. The stated time with the data is accurate.Received patient in bed to unit.  Alert and oriented.  Informed consent signed and in chart.   Heparin 4000u was given at the beginning of treatment.  Documentation in the [facility] was delayed.  Dialysis order included the order for the heparin to be given.  The order had to be entered into the [facility], and this process was delayed.  Patient tolerated well.  Transported back to the room  Alert, without acute distress.  Hand-off given to patient's nurse.   Access used: L AVF Access issues: None  Total UF removed: 4000 ml    [facility]

## 2022-03-11 DIAGNOSIS — M86252 Subacute osteomyelitis, left femur: Secondary | ICD-10-CM | POA: Diagnosis not present

## 2022-03-11 DIAGNOSIS — N186 End stage renal disease: Secondary | ICD-10-CM | POA: Diagnosis not present

## 2022-03-11 DIAGNOSIS — Z8673 Personal history of transient ischemic attack (TIA), and cerebral infarction without residual deficits: Secondary | ICD-10-CM | POA: Diagnosis not present

## 2022-03-11 DIAGNOSIS — E1169 Type 2 diabetes mellitus with other specified complication: Secondary | ICD-10-CM | POA: Diagnosis not present

## 2022-03-11 LAB — GLUCOSE, CAPILLARY
Glucose-Capillary: 123 mg/dL — ABNORMAL HIGH (ref 70–99)
Glucose-Capillary: 227 mg/dL — ABNORMAL HIGH (ref 70–99)
Glucose-Capillary: 138 mg/dL — ABNORMAL HIGH (ref 70–99)
Glucose-Capillary: 196 mg/dL — ABNORMAL HIGH (ref 70–99)

## 2022-03-11 MED ORDER — DULOXETINE HCL 60 MG PO CPEP
60.0000 mg | ORAL_CAPSULE | Freq: Every day | ORAL | Status: DC
  Administered 2022-03-12 – 2022-03-18 (×7): 60 mg via ORAL
  Filled 2022-03-11 (×7): qty 1

## 2022-03-11 NOTE — Progress Notes (Signed)
Subjective:  no cos , refuses recliner HD tomorrow hd, noted tommor 2pm meeting with her  about  dc plans .  Objective Vital signs in last 24 hours: Vitals:   03/10/22 1413 03/10/22 2018 03/11/22 0546 03/11/22 1011  BP: (!) 146/77 (!) 149/82 (!) 161/59 (!) 157/54  Pulse: 63 71 64 66  Resp: 18 18 18 17   Temp: 97.7 F (36.5 C) 98.1 F (36.7 C) 97.9 F (36.6 C) 98.3 F (36.8 C)  TempSrc: Oral Oral Oral   SpO2: 99% 98% 100% 97%  Weight:      Height:       Weight change:    Physical Exam General: [name] Heart: RRR, no MRG Lungs: CTA bilaterally, nonlabored Abdomen: Soft , +BS, NTND Extremities 1+ pitting edema bilateral lower extremities Dialysis Access: RUE AVF + bruit    Dialysis Orders: MWF at [place], 400/500, EDW 90kg, 2K/2Ca, LUE AVF - No heparin prior to admission, no ESA   Assessment/Plan: 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 Completed 6 wk course of IV Daptomycin on 10/12/21. Per Dr. Jess Barters note 12/28/2021 "NO RESTRICTIONS L HIP".  Chronic L hip/residual limb pain: Hx fractures of L femoral neck (where hardware was removed) as well as distal femur Fx. Pain control per primary.  ESRD: Usual MWF schedule. Previously AVF infiltrated but is working well as of now, is back on regular MWF schedule. Clots HD system without heparin, 4000 unit IV bolus and Heparin 2000 units IV mid-run bolus TIW.  Hyperkalemia: Stable on scheduled lokelma 4 days a week. K5.2  (03/10/22) HTN/volume: Large fluids gains between HD which has been discussed extensively, Ongoing edema bilateral lower extremity, Na low 128 and BP stable currently has been high in the past proved with UF,   Continue to use max UF with HD-plan for UFG 4-5L on HD. BP meds previously tapered down. If BP doesn't improve after HD, may need to titrate BP meds back up. Anemia of ESRD: Hb trending down. Last tsat 54% from Oct.  Checking Fe studies. Aranesp 60 q  Friday -last 12/29. recently resumed.  Hgb 9.6 (03/10/22) Secondary HPTH: CorrCa ok, no VDRA. Phos 5.9, continue Renvela as binder. Needs diet compliance. Nutrition: ALB 3.5, continue protein supplements.  Renal diet w/fluid restrictions.   AVF dysfunction (resolved): S/p branch ligation 09/19/2021  DM2: Insulin per primary.   Dispo: SNF recommended, but she declined. Not home HD candidate. Last dialyzed in recliner on 11/04/21.  Continues to refuse dialysis in recliner. Continues to refuse various aspects of care. Per Renal Navigator notes there are no clinics that will accept patient unless she will have HD in chair. Ultimately patient is making her own choices that are prolonging hospitalization.   patient has  meeting this Wednesday 03/12/22 about  discharge plans   Ernest Haber, PA-C [facility] [phone] 03/11/2022,4:05 PM  LOS: 197 days   Labs: Basic Metabolic Panel: Recent Labs  Lab 03/05/22 0835 03/07/22 1050 03/10/22 0838  NA 127* 128* 128*  K 5.4* 4.8 5.2*  CL 90* 90* 90*  CO2 21* 23 24  GLUCOSE 175* 136* 182*  BUN 101* 70* 86*  CREATININE 6.25* 5.40* 5.93*  CALCIUM 8.7* 9.0 8.7*  PHOS 6.2* 5.7* 5.9*   Liver Function Tests: Recent Labs  Lab 03/05/22 0835 03/07/22 1050 03/10/22 0838  ALBUMIN 3.6 3.8 3.5   No results for input(s): "LIPASE", "AMYLASE" in the last 168 hours. No results for  input(s): "AMMONIA" in the last 168 hours. CBC: Recent Labs  Lab 03/05/22 0930 03/10/22 0838  WBC 8.0 8.3  HGB 9.3* 9.6*  HCT 27.9* 29.3*  MCV 88.9 90.7  PLT 221 208   Cardiac Enzymes: No results for input(s): "CKTOTAL", "CKMB", "CKMBINDEX", "TROPONINI" in the last 168 hours. CBG: Recent Labs  Lab 03/10/22 1414 03/10/22 1643 03/10/22 2020 03/11/22 0731 03/11/22 1142  GLUCAP 169* 322* 206* 123* 227*    Studies/Results: No results found. Medications:  albumin human      (feeding supplement) PROSource Plus  30 mL Oral BID BM   alosetron  1  mg Oral BID   amitriptyline  50 mg Oral QHS   atorvastatin  40 mg Oral q1800   calcitRIOL  0.25 mcg Oral Daily   carvedilol  6.25 mg Oral BID   Chlorhexidine Gluconate Cloth  6 each Topical Q0600   darbepoetin (ARANESP) injection - DIALYSIS  60 mcg Subcutaneous Q Fri-1800   docusate sodium  200 mg Oral BID   [START ON 03/12/2022] DULoxetine  60 mg Oral Daily   fentaNYL  1 patch Transdermal Q72H   furosemide  80 mg Oral Daily   hydrALAZINE  50 mg Oral TID   insulin aspart  0-9 Units Subcutaneous TID WC   insulin aspart  6 Units Subcutaneous TID WC   insulin glargine-yfgn  10 Units Subcutaneous QHS   methocarbamol  500 mg Oral QID   multivitamin  1 tablet Oral QHS   pantoprazole  40 mg Oral Daily   sevelamer carbonate  2,400 mg Oral TID WC   sodium zirconium cyclosilicate  10 g Oral Once per day on Sun Tue Thu Sat   topiramate  12.5 mg Oral Daily

## 2022-03-11 NOTE — Progress Notes (Signed)
PROGRESS NOTE    [name]  GHW:[id number] DOB: 1965-07-19 DOA: 08/26/2021 PCP: [name], MD   Brief Narrative: This 57 year old female with PMH significant of hypertension, hyperlipidemia, diabetes mellitus type 2, end-stage renal disease on hemodialysis, status post left BKA for chronic calcaneus osteomyelitis on 01/12/2020, bipolar disorder, left distal femoral fracture after a fall on 07/19/2021 deemed  nonoperative, admission from 08/03/2021-08/23/2021 for left femoral osteomyelitis requiring partial resection of femur and rearrangements of bony fragments with subsequent antibiotic treatment presented to the hospital with left hip bleeding and was found to have subacute osteomyelitis and abscess of left femur.  Orthopedic/ID were consulted and patient was started on broad-spectrum antibiotics.  Patient underwent multiple I&D's by Dr. [name] along with local tissue rearrangement.  Patient was also seen by vascular surgery who did fistula branch ligation.  Patient has completed 6-week course of daptomycin on 10/09/2021.  She has been unable to complete hemodialysis in chair and outpatient hemodialysis unit has been hard to find. She is otherwise medically stable for discharge.   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 Orthopedic surgery consulted. ID consulted. Patient underwent hardware removal on 6/10 (prior to admission) with I&D on 6/28 and 7/1. Infectious disease recommendations for 6 weeks of Daptomycin; patient has completed course. Stable.  History of left intertrochanteric fracture This was previously managed with IM nail placement in 2021. Hardware removed secondary to above.  ESRD on HD Nephrology on board.  -HD per nephrology  Anasarca Improved with hemodialysis.  Hyponatremia Management with hemodialysis.  Hypokalemia Hyperkalemia Management with hemodialysis  Anemia  of chronic disease Acute blood loss anemia In setting of CKD in addition to blood loss from wound. Patient has received a total of 2 units of PRBC via transfusion. Hemoglobin stable.  Diabetes mellitus type 2 Diabetes is uncontrolled with hyperglycemia. Most recent hemoglobin A1C of 7.1%. Patient is on insulin glargine and insulin aspart as an outpatient. -Continue Semglee 10 units qHS, Novolog 6 units TID with meals and SSI  Migraine headaches Improved since restarting Topamax -Continue Topamax 12.5 mg daily (renally dosed) and titrate as needed/safe  Primary hypertension -Continue hydralazine and Coreg  Hyperlipidemia History of CVA -Continue Lipitor  GERD -Continue Pepcid and Protonix  Tobacco use Tobacco cessation discussed during this admission.  History of left BKA Noted.  Debility Patient is not always consistent with HD secondary to subjective reasons. This also affects physical therapy with patient deciding when and how much to participate. Patient voices goals for improvement but inconsistently follows through, and this has contributed to long length of stay. Psychiatry consulted with recommendations for consistent boundaries and consideration of enrollment in [facility] program.   DVT prophylaxis: Heparin subq Code Status:   Code Status: Full Code Family Communication: None at bedside Disposition Plan: Discharge to SNF. Medically stable for discharge. Needs to tolerate HD while in a chair.   Consultants:  Orthopedic surgery Infectious disease Nephrology Vascular surgery Psychiatry  Procedures:  Vascular ultrasound duplex dialysis access 09/07/2021 Fistula revision branch ligation per Dr. Unk Lightning 09/19/2021- Left hip debridement, application of wound VAC by Dr. [name] 08/28/2021  Antimicrobials: Daptomycin Cefazolin   Subjective: No issues noted overnight.  Objective: BP (!) 157/54 (BP Location: Right Arm)   Pulse 66   Temp 98.3 F (36.8 C)   Resp 17   Ht  5\' 10"  (1.778 m)   Wt 96.4 kg   SpO2 97%  BMI 30.49 kg/m   Examination:  General exam: Appears calm and comfortable   CBC Lab Results  Component Value Date   WBC 8.3 03/10/2022   RBC 3.23 (L) 03/10/2022   HGB 9.6 (L) 03/10/2022   HCT 29.3 (L) 03/10/2022   MCV 90.7 03/10/2022   MCH 29.7 03/10/2022   PLT 208 03/10/2022   MCHC 32.8 03/10/2022   RDW 14.0 03/10/2022   LYMPHSABS 1.8 02/03/2022   MONOABS 0.9 02/03/2022   EOSABS 0.2 02/03/2022   BASOSABS 0.1 00/71/2197     Last metabolic panel Lab Results  Component Value Date   NA 128 (L) 03/10/2022   K 5.2 (H) 03/10/2022   CL 90 (L) 03/10/2022   CO2 24 03/10/2022   BUN 86 (H) 03/10/2022   CREATININE 5.93 (H) 03/10/2022   GLUCOSE 182 (H) 03/10/2022   GFRNONAA 8 (L) 03/10/2022   GFRAA 13 (L) 12/17/2018   CALCIUM 8.7 (L) 03/10/2022   PHOS 5.9 (H) 03/10/2022   PROT 7.6 12/02/2021   ALBUMIN 3.5 03/10/2022   LABGLOB 3.1 10/29/2018   AGRATIO 1.4 10/29/2018   BILITOT 0.6 12/02/2021   ALKPHOS 105 12/02/2021   AST 16 12/02/2021   ALT 15 12/02/2021   ANIONGAP 14 03/10/2022    GFR: Estimated Creatinine Clearance: 13.6 mL/min (A) (by C-G formula based on SCr of 5.93 mg/dL (H)).  No results found for this or any previous visit (from the past 240 hour(s)).    Radiology Studies: No results found.    LOS: 197 days    Cordelia Poche, MD [facility] 03/11/2022, 2:26 PM   If 7PM-7AM, please contact night-coverage [email or web address]

## 2022-03-11 NOTE — Progress Notes (Signed)
Physical Therapy Treatment Patient Details Name: [name] MRN: [id number] DOB: Jul 14, 1965 Today's Date: 03/11/2022   History of Present Illness 57 y.o. female presented to ED 6/26 from dialysis with increased bloody drainage from L hip wound. 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 s/p hardware removal of left hip with partial resection of tuft tissue and femure that were nonviable on 08/10/21 Discharged home 6/23. underwent L hip debridement and wound vac placement 6/28; +Left intertrochanteric non-union,  Fracture of left distal femur  PMH: hypertension, hyperlipidemia, ESRD on HD MWF, history of left BKA in 2021, depression/anxiety, stroke, tobacco use, T2DM,  insomnia, chronic pain syndrome.    PT Comments    Good effort today, patient requesting transfer training with sliding board to/from bed/wheelchair. Required min assist to place sliding board, min guard and w/c block for safe transfer techniques. Reviewed importance of positioning. Able to donne prosthesis today, however seemed to hinder transfer due to Lt hip being externally rotated. Discussed awareness and use of prosthesis as able. Pt wearing new shrinker. Patient will continue to benefit from skilled physical therapy services to further improve independence with functional mobility.    Recommendations for follow up therapy are one component of a multi-disciplinary discharge planning process, led by the attending physician.  Recommendations may be updated based on patient status, additional functional criteria and insurance authorization.  Follow Up Recommendations  Skilled nursing-short term rehab (<3 hours/day) Can patient physically be transported by private vehicle: No   Assistance Recommended at Discharge Intermittent Supervision/Assistance  Patient can return home with the following A lot of help with bathing/dressing/bathroom;Assistance with cooking/housework;Assist for  transportation;Two people to help with walking and/or transfers;Help with stairs or ramp for entrance   Equipment Recommendations  Other (comment);Hospital bed (slide board, drop arm BSC, wheelchair cushion, [name] Lei transport for HD)    Recommendations for Other Services Other (comment) (Psychotherapy)     Precautions / Restrictions Precautions Precautions: Fall Precaution Comments: L BKA (baseline) Restrictions Weight Bearing Restrictions: No LLE Weight Bearing: Weight bearing as tolerated Other Position/Activity Restrictions: L distal femur fx; wearing new shrinker     Mobility  Bed Mobility Overal bed mobility: Needs Assistance Bed Mobility: Supine to Sit, Sit to Supine     Supine to sit: HOB elevated, Min assist Sit to supine: Min guard   General bed mobility comments: Required light HHA to acheive EOB sitting, with HOB moderately elevated. Cues for technique, forward flexion of trunk due to posterior lean at first. Min guard with return to supine. Cues for technique, prosthesis was doffed.    Transfers Overall transfer level: Needs assistance Equipment used: Sliding board Transfers: Bed to chair/wheelchair/BSC            Lateral/Scoot Transfers: Min assist, With slide board General transfer comment: Min assist to place sliding board for transfer from bed to chair and back. Had pt teach back technique for further education and allow for instruction she will provided family with assistance. Pt very slowly capable of performing scoot transfer to and from bed/wheelchair. Intermittent VC for use of prosthesis however often neglects and results in hindering transfer. Education for awareness. min guard during transfer slide both directions, w/c block.    Ambulation/Gait                   Geneticist, molecular Details (indicate cue type and reason): Declines use today.  Modified Rankin (Stroke  Patients Only)       Balance Overall balance assessment: Needs assistance Sitting-balance support: Feet supported Sitting balance-Leahy Scale: Good Sitting balance - Comments: able to maintain balance while performing lateral scooting                                    Cognition Arousal/Alertness: Awake/alert Behavior During Therapy: WFL for tasks assessed/performed Overall Cognitive Status: Within Functional Limits for tasks assessed                                          Exercises Other Exercises Other Exercises: Reiewed positioning    General Comments General comments (skin integrity, edema, etc.): wearing shrinker sock consistently again. Able to donne prosthesis with assistance. Lt hip is significantly externally rotated.      Pertinent Vitals/Pain Pain Assessment Pain Assessment: Faces Faces Pain Scale: Hurts even more Pain Location: back Pain Descriptors / Indicators: Discomfort, Nagging Pain Intervention(s): Monitored during session, Repositioned, Limited activity within patient's tolerance, Patient requesting pain meds-RN notified    Home Living                          Prior Function            PT Goals (current goals can now be found in the care plan section) Acute Rehab PT Goals Patient Stated Goal: Would like to go home PT Goal Formulation: With patient Time For Goal Achievement: 03/18/22 Potential to Achieve Goals: Fair Progress towards PT goals: Progressing toward goals    Frequency    Min 2X/week      PT Plan Current plan remains appropriate    Co-evaluation              AM-PAC PT "6 Clicks" Mobility   Outcome Measure  Help needed turning from your back to your side while in a flat bed without using bedrails?: None Help needed moving from lying on your back to sitting on the side of a flat bed without using bedrails?: A Little Help needed moving to and from a bed to a chair (including a  wheelchair)?: A Little Help needed standing up from a chair using your arms (e.g., wheelchair or bedside chair)?: Total Help needed to walk in hospital room?: Total Help needed climbing 3-5 steps with a railing? : Total 6 Click Score: 13    End of Session Equipment Utilized During Treatment: Other (comment) (sliding board) Activity Tolerance: Patient tolerated treatment well Patient left: in bed;with call bell/phone within reach;with bed alarm set;Other (comment) (shrinker on) Nurse Communication: Mobility status PT Visit Diagnosis: Other abnormalities of gait and mobility (R26.89);Muscle weakness (generalized) (M62.81);History of falling (Z91.81) Pain - part of body:  (back)     Time: 5701-7793 PT Time Calculation (min) (ACUTE ONLY): 28 min  Charges:  $Therapeutic Activity: 8-22 mins $Self Care/Home Management: 8-22                     Candie Mile, PT, DPT Physical Therapist Acute Rehabilitation Services [facility] 03/11/2022, 3:22 PM

## 2022-03-12 DIAGNOSIS — M86252 Subacute osteomyelitis, left femur: Secondary | ICD-10-CM | POA: Diagnosis not present

## 2022-03-12 LAB — BASIC METABOLIC PANEL
Anion gap: 13 (ref 5–15)
BUN: 69 mg/dL — ABNORMAL HIGH (ref 6–20)
CO2: 25 mmol/L (ref 22–32)
Calcium: 8.6 mg/dL — ABNORMAL LOW (ref 8.9–10.3)
Chloride: 88 mmol/L — ABNORMAL LOW (ref 98–111)
Creatinine, Ser: 5.32 mg/dL — ABNORMAL HIGH (ref 0.44–1.00)
GFR, Estimated: 9 mL/min — ABNORMAL LOW (ref >60)
Glucose, Bld: 126 mg/dL — ABNORMAL HIGH (ref 70–99)
Potassium: 5 mmol/L (ref 3.5–5.1)
Sodium: 126 mmol/L — ABNORMAL LOW (ref 135–145)

## 2022-03-12 LAB — GLUCOSE, CAPILLARY
Glucose-Capillary: 96 mg/dL (ref 70–99)
Glucose-Capillary: 116 mg/dL — ABNORMAL HIGH (ref 70–99)
Glucose-Capillary: 224 mg/dL — ABNORMAL HIGH (ref 70–99)

## 2022-03-12 NOTE — Progress Notes (Signed)
   03/12/22 2049  Vitals  Temp 97.6 F (36.4 C)  BP (!) 158/58  MAP (mmHg) 85  Pulse Rate 67  ECG Heart Rate 67  Resp 17  Post Treatment  Dialyzer Clearance Heavily streaked  Duration of HD Treatment -hour(s) 4 hour(s)  Liters Processed 87  Fluid Removed (mL) 4100 mL  Tolerated HD Treatment Yes  AVG/AVF Venous Site Held (minutes) 5 minutes   TX fin. W/o  difficulty.

## 2022-03-12 NOTE — Progress Notes (Signed)
Physical Therapy Treatment Patient Details Name: [name] MRN: [id number] DOB: 1965-05-25 Today's Date: 03/12/2022   History of Present Illness 57 y.o. female presented to ED 6/26 from dialysis with increased bloody drainage from L hip wound. 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 s/p hardware removal of left hip with partial resection of tuft tissue and femure that were nonviable on 08/10/21 Discharged home 6/23. underwent L hip debridement and wound vac placement 6/28; +Left intertrochanteric non-union,  Fracture of left distal femur  PMH: hypertension, hyperlipidemia, ESRD on HD MWF, history of left BKA in 2021, depression/anxiety, stroke, tobacco use, T2DM,  insomnia, chronic pain syndrome.    PT Comments    Continuing work on functional mobility and activity tolerance, including supporting pt in her goal of getting home; Participated in Team Meeting with pt and her daughter re: planning for dc out of hospital and getting home (see other discipline notes of this date); Plan is for Milessa to dc home on Tuesday, 1/16;  After the meeting, stayed with [name] and her daughter to discuss tasks to work on with PT to set her up for success; Plan to work on transfers (lateral scooting, and pt requests to try squat or stand pivot transfers as well); wheelchair mobility, and pt and her daughter request to go to the 4th floor to practice car transfers with the car mock-up; This PT requested that we work on transfers wc<>HD recliner, and pt agrees to do so;   She agrees to go to HD in the HD recliner on Friday and Monday; the HD recliners have bil drop-arms, and Leeona will likely be able to get to the HD recliner with min assist;   Noted pt's existing wc has a broken L armrest; put in a secure chat to [facility] to see if the wheelchair can be fixed or replaced.  Recommendations for follow up therapy are one component of a multi-disciplinary discharge planning process,  led by the attending physician.  Recommendations may be updated based on patient status, additional functional criteria and insurance authorization.  Follow Up Recommendations  Home health PT Can patient physically be transported by private vehicle: No   Assistance Recommended at Discharge Intermittent Supervision/Assistance  Patient can return home with the following A little help with walking and/or transfers;Assistance with cooking/housework;Assist for transportation   Equipment Recommendations  Wheelchair (measurements PT);Wheelchair cushion (measurements PT) (get current wheelchair fixed? long sliding board, drop-arm BSC, van trasnport for HD)    Recommendations for Other Services       Precautions / Restrictions Precautions Precautions: Fall Precaution Comments: L BKA (baseline) Restrictions LLE Weight Bearing: Weight bearing as tolerated Other Position/Activity Restrictions: L distal femur fx; wearing new shrinker     Mobility  Bed Mobility                    Transfers                        Ambulation/Gait                   Stairs             Wheelchair Mobility    Modified Rankin (Stroke Patients Only)       Balance  Cognition Arousal/Alertness: Awake/alert Behavior During Therapy: WFL for tasks assessed/performed Overall Cognitive Status: Within Functional Limits for tasks assessed                                 General Comments: Pt particiapted well in [facility] meeting        Exercises      General Comments        Pertinent Vitals/Pain Pain Assessment Pain Assessment: Faces Faces Pain Scale: No hurt Pain Intervention(s): Monitored during session, Limited activity within patient's tolerance    Home Living                          Prior Function            PT Goals (current goals can now be found in the care plan  section) Acute Rehab PT Goals Patient Stated Goal: Would like to go home Progress towards PT goals: Progressing toward goals    Frequency    Min 3X/week      PT Plan Discharge plan needs to be updated;Frequency needs to be updated;Equipment recommendations need to be updated    Co-evaluation              AM-PAC PT "6 Clicks" Mobility   Outcome Measure  Help needed turning from your back to your side while in a flat bed without using bedrails?: None Help needed moving from lying on your back to sitting on the side of a flat bed without using bedrails?: A Little Help needed moving to and from a bed to a chair (including a wheelchair)?: A Little Help needed standing up from a chair using your arms (e.g., wheelchair or bedside chair)?: Total Help needed to walk in hospital room?: Total Help needed climbing 3-5 steps with a railing? : Total 6 Click Score: 13    End of Session   Activity Tolerance: Patient tolerated treatment well Patient left: in bed;with call bell/phone within reach (shrinker on, awaiting HD) Nurse Communication: Mobility status PT Visit Diagnosis: Other abnormalities of gait and mobility (R26.89);Muscle weakness (generalized) (M62.81);History of falling (Z91.81) Pain - Right/Left: Left Pain - part of body:  (back, stomach)     Time: 1400-1440 PT Time Calculation (min) (ACUTE ONLY): 40 min  Charges:  $Self Care/Home Management: [place], [place] Office [phone]    Colletta [place] 03/12/2022, 5:33 PM

## 2022-03-12 NOTE — Progress Notes (Signed)
Attended meeting today with pt and pt's daughter per request of TOC staff. Contacted [facility] Freight forwarder to provide update from meeting. Also inquired about pt's appt time at d/c since pt has been in the hospital for an extended period of time. In the meeting, pt states that she can sit in a chair for HD. Inquired of clinic manager if pt will be required to sit for HD prior to d/c as stated by clinic earlier in pt's hospital stay since pt states she is able to do this. Awaiting a response from clinic manager and will provide update to staff.    Melven Sartorius Renal Navigator [phone]

## 2022-03-12 NOTE — Consult Note (Addendum)
[facility] New Face-to-Face Psychiatric Evaluation   Service Date: March 12, 2022 LOS:  LOS: 198 days    Assessment  [name] is a 57 y.o. female admitted medically for 08/26/2021 12:09 PM for subacute osteomyelitis and abscess of L femur. She carries the psychiatric diagnoses of anxiety and depression and has a past medical history of  ESRD, anemia of CKD, L hip fracture s/p IM nail placement. Psychiatry was consulted for psychological assessment by Dr. Lonny Prude. Notably she has been in the hospital for almost 6 months. The main barrier to discharge is pt's inability to sit in a chair.    Her current presentation of loss of hope and chronic difficulty coping with hospitalization is most consistent with demoralization syndrome; she also meets criteria for previously diagnosed mild to moderate depression.  Current outpatient psychotropic medications include elavil and duloxetine and historically she has had a fair to good response to these medications. Notably, duloxetine was stopped about a week before last consult which possibly explains some of the worsening anxiety and tearfulness. She was put briefly on mirtazapine, but eventually medication regimen consolidated to duloxetine at 60 mg dosing. She was compliant with medications prior to consult as evidenced by 6 month MAR.   On initial examination, patient presented as initially hostile, but warmed up throughout eval and eventually engaged in brief supportive psychotherapy. On repeat eval, she was engaged throughout the session and responded well to ACT techniques (see below). No med changes today. I spent approximately 40 minutes with this pt and/or coordinating care of which at least 30 were spent in individual psychotherapy.   I think she would benefit from ongoing therapy while inpt. Please see plan below for detailed recommendations.   Diagnoses:  Active Hospital problems: Principal Problem:   Subacute  osteomyelitis of left femur with abscess  Active Problems:   TOBACCO DEPENDENCE   GASTROESOPHAGEAL REFLUX, NO ESOPHAGITIS   Anxiety and depression   Obesity (BMI 30.0-34.9)   ESRD (end stage renal disease) on dialysis [facility])   Essential hypertension   Type 2 diabetes mellitus with hyperlipidemia ([place])   History of CVA (cerebrovascular accident)     Plan  ## Safety and Observation Level:  - Based on my clinical evaluation, I estimate the patient to be at low risk of self harm in the current setting - At this time, we recommend a routine level of observation. This decision is based on my review of the chart including patient's history and current presentation, interview of the patient, mental status examination, and consideration of suicide risk including evaluating suicidal ideation, plan, intent, suicidal or self-harm behaviors, risk factors, and protective factors. This judgment is based on our ability to directly address suicide risk, implement suicide prevention strategies and develop a safety plan while the patient is in the clinical setting. Please contact our team if there is a concern that risk level has changed.   ## Medications:  -- c elavil 50 mg  -- c duloxetine 60 mg  -- stoped xanax previously per pt request   ## Medical Decision Making Capacity:  Not formally assessed  ## Further Work-up:  --  last TSH wnl - vit D low although renal pt    -- most recent EKG on 07/2021 had QtC of <500 -- Pertinent labwork reviewed earlier this admission includes: B12 wnl this admission  ## Disposition:  -- per primary  ## Behavioral / Environmental:  -- Staff should set consistent boundaries and expectations with patient --  Utilize compassion and acknowledge the patient's experiences while setting clear and realistic expectations for care.   ##Legal Status -- vol   Thank you for this consult request. Recommendations have been communicated to the primary team.  We will  continue to follow at this time.   Paxton A Paras Kreider   New history   Relevant Aspects of Hospital Course:  Admitted on 08/26/2021 for IV abx in setting of osteomyelitis. Has had long hospital course only briefly summarized here. Most recently the issue has been pt's inability or unwillingness to sit in dialysis chair (which is a prerequisite to outpt dialysis). Has been able to tolerate ~2/4 hours while sitting. This is complicated by pt's stated goal of dc home (which frankly will be difficult to acheive physical decline over last 6 months). She has given many reasons she is unable to sit for dialysis, but the biggest stated barrier has been pain.   Patient Report:  Pt seen in early afternoon. She opens up fairly quickly and discussed changes over the last few weeks - her daughter was in a scary accident on Christmas Day and she was unable to visit or care for her, and she has had some frustrations with medications being changed without being informed (duloxetine stopped then started again, mirtazapine stopped). Explained purpose for visits (ACT informed therapy) and spent bulk of time on "choice point" diagram with goal of leaving the hospital. Pt was able to engage in discussion of behaviors that move her towards and away from this goal. Needed some redirection to things she controls vs things she doesn't. Briefly discussed techniques of getting "unhooked" when moving away from goal (ie when anxiety interferes in PT); this will be focus of next session. Today no SI, HI, AH/VH; felt mirtazapine helpful but per report prefers duloxetine. Agreeable to f/u next week on M, Tu or W.    Depression: She does still enjoy seeing her daughter, but is otherwise anhedonic. She sleeps OK on amitriptyline unless woken up by nursing staff. No guilt, worthlesness. Energy is OK for circumstances. Appetite is generally decreased esp w/ nausea. She is mildly restless. She denies suicidality. Would reach out if this  changes.  Brief screens for mania, psychosis (-).   ROS:  Pain, fatigue  Collateral information:  From chart review  Psychiatric History:  Information collected from pt On many antidepressants, none really worked. All thorugh FM never seen psych or therapy  No hx suicidality No inpt psych admissions  Family psych history: unknown   Social History:  Essentially lives in hospital 1 daughter, 56 Has stepgrandchildren; close with them but divorced from that husband Socially isolated  Tobacco use: prior to hospital   Family History:   The patient's family history includes Diabetes in her father, mother, and sister; Heart attack in her father and mother; Heart disease in her father and mother; Hyperlipidemia in her mother; Hypertension in her mother and sister.  Medical History: Past Medical History:  Diagnosis Date   Anxiety 2002   Chest tightness    Chronic kidney disease    Depression 2001   Diabetes type 1, uncontrolled    at age 11   Diabetic neuropathy ([place])    Essential hypertension 2015   GERD (gastroesophageal reflux disease)    about age of 70   Headache    Nausea and vomiting in adult    Stroke [facility])    Urinary frequency    Yeast vaginitis     Surgical History: Past Surgical History:  Procedure Laterality Date   A/V FISTULAGRAM Left 08/22/2021   Procedure: A/V Fistulagram;  Surgeon: Cherre Robins, MD;  Location: [place] [facility];  Service: Cardiovascular;  Laterality: Left;   ACHILLES TENDON SURGERY Left 03/10/2013   Procedure: LEFT CHOPART AMPUTATION/ LEFT TENDON ACHILLES [place];  Surgeon: Wylene Simmer, MD;  Location: [place];  Service: Orthopedics;  Laterality: Left;   AMPUTATION Left 06/15/2012   Procedure: AMPUTATION DIGIT;  Surgeon: Meredith Pel, MD;  Location: [place];  Service: Orthopedics;  Laterality: Left;  Left great toe revision amputation   AMPUTATION Left 01/12/2020   Procedure: AMPUTATION BELOW KNEE;  Surgeon: Wylene Simmer, MD;   Location: [place];  Service: Orthopedics;  Laterality: Left;   APPLICATION OF WOUND VAC  08/28/2021   Procedure: APPLICATION OF WOUND VAC;  Surgeon: Newt Minion, MD;  Location: [place];  Service: Orthopedics;;   AV FISTULA PLACEMENT Left 01/09/2020   Procedure: LEFT UPPER ARM ARTERIOVENOUS (AV) FISTULA CREATION;  Surgeon: Cherre Robins, MD;  Location: [place];  Service: Vascular;  Laterality: Left;   ENDOMETRIAL ABLATION     HARDWARE REMOVAL Left 08/10/2021   Procedure: LEFT HIP REMOVAL NAIL;  Surgeon: Newt Minion, MD;  Location: [place];  Service: Orthopedics;  Laterality: Left;   I & D EXTREMITY Left 01/19/2020   Procedure: IRRIGATION AND DEBRIDEMENT Left Hip;  Surgeon: Rod Can, MD;  Location: [place];  Service: Orthopedics;  Laterality: Left;   I & D EXTREMITY Left 08/28/2021   Procedure: LEFT HIP DEBRIDEMENT;  Surgeon: Newt Minion, MD;  Location: [place];  Service: Orthopedics;  Laterality: Left;   I & D EXTREMITY Left 08/31/2021   Procedure: REPEAT DEBRIDEMENT LEFT HIP;  Surgeon: Newt Minion, MD;  Location: [place];  Service: Orthopedics;  Laterality: Left;   INSERTION OF DIALYSIS CATHETER Right 01/09/2020   Procedure: INSERTION OF DIALYSIS CATHETER;  Surgeon: Cherre Robins, MD;  Location: [place];  Service: Vascular;  Laterality: Right;   INTRAMEDULLARY (IM) NAIL INTERTROCHANTERIC Left 12/29/2019   Procedure: INTRAMEDULLARY (IM) NAIL INTERTROCHANTRIC;  Surgeon: Rod Can, MD;  Location: [facility];  Service: Orthopedics;  Laterality: Left;   IR ANGIOGRAM EXTREMITY LEFT  12/17/2018   IR FEM POP ART ATHERECT INC PTA MOD SED  12/17/2018   IR RADIOLOGIST EVAL & MGMT  11/30/2018   IR RADIOLOGIST EVAL & MGMT  12/09/2018   IR RADIOLOGIST EVAL & MGMT  02/08/2019   IR US GUIDE VASC ACCESS RIGHT  12/17/2018   LAPAROSCOPIC CHOLECYSTECTOMY  01/30/2015   [facility]    REVISON OF ARTERIOVENOUS FISTULA Left 09/19/2021   Procedure: LIGATION OF LEFT ARM ARTERIOVENOUS FISTULA;  Surgeon: Broadus John, MD;  Location: [facility];  Service: Vascular;  Laterality: Left;    Medications:   Current Facility-Administered Medications:    (feeding supplement) PROSource Plus liquid 30 mL, 30 mL, Oral, BID BM, Stovall, Kathryn R, PA-C, 30 mL at 03/12/22 1342   acetaminophen (TYLENOL) tablet 325-650 mg, 325-650 mg, Oral, Q6H PRN, Newt Minion, MD, 650 mg at 02/23/22 1011   albumin human 25 % solution 12.5 g, 12.5 g, Intravenous, PRN, Penninger, Ria Comment, PA   alosetron (LOTRONEX) tablet 1 mg, 1 mg, Oral, BID, Pokhrel, Laxman, MD, 1 mg at 03/12/22 0912   amitriptyline (ELAVIL) tablet 50 mg, 50 mg, Oral, QHS, Pokhrel, Laxman, MD, 50 mg at 03/11/22 2201   atorvastatin (LIPITOR) tablet 40 mg, 40 mg, Oral, q1800, Manuella Ghazi, Pratik D, DO, 40  mg at 03/11/22 1811   bisacodyl (DULCOLAX) suppository 10 mg, 10 mg, Rectal, Daily PRN, Newt Minion, MD   calcitRIOL (ROCALTROL) capsule 0.25 mcg, 0.25 mcg, Oral, Daily, Cyndia Skeeters, Taye T, MD, 0.25 mcg at 03/11/22 1114   calcium carbonate (TUMS - dosed in mg elemental calcium) chewable tablet 200 mg of elemental calcium, 1 tablet, Oral, Q2H PRN, Shalhoub, Sherryll Burger, MD, 200 mg of elemental calcium at 02/20/22 2105   camphor-menthol (SARNA) lotion, , Topical, PRN, Donne Haz[place], MD   carvedilol (COREG) tablet 6.25 mg, 6.25 mg, Oral, BID, Cyndia Skeeters, Taye T, MD, 6.25 mg at 03/11/22 2201   Chlorhexidine Gluconate Cloth 2 % PADS 6 each, 6 each, Topical, Q0600, Collins, Samantha G, PA-C   Darbepoetin Alfa (ARANESP) injection 60 mcg, 60 mcg, Subcutaneous, Q Fri-1800, Collins, Samantha G, PA-C, 60 mcg at 03/07/22 1708   docusate sodium (COLACE) capsule 200 mg, 200 mg, Oral, BID, Berle Mull M, MD, 200 mg at 03/11/22 2201   DULoxetine (CYMBALTA) DR capsule 60 mg, 60 mg, Oral, Daily, Mariel Aloe, MD   fentaNYL (DURAGESIC) 12 MCG/HR 1 patch, 1 patch, Transdermal, Q72H, Pham, Minh Q, RPH-CPP, 1 patch at 03/10/22 1616   furosemide (LASIX) tablet 80 mg, 80 mg, Oral, Daily, Reesa Chew, MD, 80 mg at 03/11/22 1114   hydrALAZINE (APRESOLINE) injection 5 mg, 5 mg, Intravenous, Q8H PRN, Hosie Poisson, MD   hydrALAZINE (APRESOLINE) tablet 50 mg, 50 mg, Oral, TID, Verlon Au, Jai-Gurmukh, MD, 50 mg at 03/11/22 2201   insulin aspart (novoLOG) injection 0-9 Units, 0-9 Units, Subcutaneous, TID WC, Gonfa, Taye T, MD, 3 Units at 03/11/22 1301   insulin aspart (novoLOG) injection 6 Units, 6 Units, Subcutaneous, TID WC, [place] Donnamarie Poag, DO, 6 Units at 03/12/22 1342   insulin glargine-yfgn (SEMGLEE) injection 10 Units, 10 Units, Subcutaneous, QHS, Alekh, Kshitiz, MD, 10 Units at 03/11/22 2202   methocarbamol (ROBAXIN) tablet 500 mg, 500 mg, Oral, QID, 500 mg at 03/12/22 1342 **OR** [DISCONTINUED] methocarbamol (ROBAXIN) 500 mg in dextrose 5 % 50 mL IVPB, 500 mg, Intravenous, Q6H PRN, Newt Minion, MD   multivitamin (RENA-VIT) tablet 1 tablet, 1 tablet, Oral, QHS, Newt Minion, MD, 1 tablet at 03/11/22 2201   ondansetron (ZOFRAN) tablet 4 mg, 4 mg, Oral, Q4H PRN, Rai, Ripudeep K, MD, 4 mg at 02/26/22 1744   Oral care mouth rinse, 15 mL, Mouth Rinse, PRN, Dessa Phi, DO   oxyCODONE (Oxy IR/ROXICODONE) immediate release tablet 5 mg, 5 mg, Oral, Q8H PRN, Arrien, Jimmy Picket, MD, 5 mg at 03/12/22 1503   pantoprazole (PROTONIX) EC tablet 40 mg, 40 mg, Oral, Daily, Arrien, Jimmy Picket, MD, 40 mg at 03/11/22 1113   polyethylene glycol (MIRALAX / GLYCOLAX) packet 17 g, 17 g, Oral, BID PRN, Cyndia Skeeters, Taye T, MD   promethazine (PHENERGAN) tablet 12.5 mg, 12.5 mg, Oral, Q6H PRN, Hosie Poisson, MD, 12.5 mg at 03/05/22 1114   senna-docusate (Senokot-S) tablet 1 tablet, 1 tablet, Oral, QHS PRN, Amin, Ankit Chirag, MD   sevelamer carbonate (RENVELA) tablet 2,400 mg, 2,400 mg, Oral, TID WC, Collins, Samantha G, PA-C, 2,400 mg at 03/12/22 1331   sodium zirconium cyclosilicate (LOKELMA) packet 10 g, 10 g, Oral, Once per day on Sun Tue Thu Sat, Samtani, Jai-Gurmukh, MD, 10 g at 03/04/22 3300    topiramate (TOPAMAX) tablet 12.5 mg, 12.5 mg, Oral, Daily, Mariel Aloe, MD, 12.5 mg at 03/11/22 1115  Allergies: Allergies  Allergen Reactions   Trazodone Swelling  Gabapentin Itching   Lyrica [Pregabalin] Itching   Latex Rash   Lidocaine Itching       Objective  Vital signs:  Temp:  [98.3 F (36.8 C)-98.7 F (37.1 C)] 98.6 F (37 C) (01/10 1545) Pulse Rate:  [59-65] 61 (01/10 1600) Resp:  [15-28] 15 (01/10 1600) BP: (153-180)/(57-67) 176/67 (01/10 1600) SpO2:  [97 %-100 %] 97 % (01/10 1600)  Psychiatric Specialty Exam:  Presentation  General Appearance: Appropriate for Environment; Fairly Groomed (long nails)  Eye Contact:Good  Speech:Clear and Coherent  Speech Volume:Normal  Handedness:No data recorded  Mood and Affect  Mood:-- Engineer, materials)  Affect:Congruent (Full range, brightens appropriately, appropriate to topic of conversation)   Thought Process  Thought Processes:Coherent; Goal Directed; Linear  Descriptions of Associations:Intact  Orientation:Full (Time, Place and Person)  Thought Content:Logical; Rumination  History of Schizophrenia/Schizoaffective disorder:No data recorded Duration of Psychotic Symptoms:No data recorded Hallucinations:Hallucinations: None   Ideas of Reference:None  Suicidal Thoughts:Suicidal Thoughts: No   Homicidal Thoughts:Homicidal Thoughts: No    Sensorium  Memory:Immediate Good; Recent Fair; Remote Good  Judgment:Fair  Insight:Fair   Executive Functions  Concentration:Good  Attention Span:Good  [place] of Knowledge:Good  Language:Good   Psychomotor Activity  Psychomotor Activity:Psychomotor Activity: Normal    Assets  Assets:Social Support   Sleep  Sleep:Sleep: Fair     Physical Exam: Physical Exam HENT:     Head: Normocephalic.  Pulmonary:     Effort: Pulmonary effort is normal.  Skin:    Comments: No rash on visible skin, long manicured nails  Neurological:      Mental Status: She is alert.    Blood pressure (!) 176/67, pulse 61, temperature 98.6 F (37 C), temperature source Oral, resp. rate 15, height 5\' 10"  (1.778 m), weight 96.4 kg, SpO2 97 %. Body mass index is 30.49 kg/m.

## 2022-03-12 NOTE — Progress Notes (Signed)
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  Orthopedic/ID were consulted and patient was a started on broad-spectrum antibiotics.  Patient underwent multiple I&D's by Dr. [name] along with local tissue rearrangement.  Patient was also seen by vascular surgery who did fistula branch ligation.  Patient has completed 6 weeks course of daptomycin on 80/90/2023.  She has been unable to complete hemodialysis in chair and outpatient hemodialysis unit has been hard to find.  She is otherwise medically stable for discharge.     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  Active Problems:   ESRD (end stage renal disease) on dialysis ([place])   Type 2 diabetes mellitus with hyperlipidemia ([facility])   History of CVA (cerebrovascular accident)   Essential hypertension   Anxiety and depression   GASTROESOPHAGEAL REFLUX, NO ESOPHAGITIS   TOBACCO DEPENDENCE   Obesity (BMI 30.0-34.9)  1-Subacute osteomyelitis of the left femur with abscess Infected left hip hardware status post removal -Patient was a started on cefazolin.  Orthopedic surgery consulted.  ID consulted.  Patient underwent hardware removal on 6/10 prior to admission with I and D on 6/28 and 7/1. -Has completed 6 weeks of Daptomycin.  2-Left  intertrochanteric fracture: This was previously managed with IM nail placement 2021. Hardware removed secondary to above  3-ESRD on hemodialysis: Nephrology following Patient's need to be able to sit on chair for hemodialysis to be able to set up outpatient hemodialysis.  Anasarca: Improved with hemodialysis  Hyponatremia: Management with hemodialysis  Hypokalemia, Hyperkalemia: management with hemodialysis Anemia  of chronic disease, Acute blood loss anemia: In the setting of CKD in addition to blood loss from wound.  She has received a total of 2 units of packed red blood cell during this hospitalization  Diabetes Type 2;  Uncontrolled with hyperglycemia.  Recent A1c 7.1 Continue with Semglee and NovoLog   Migraines headache: Continue with Topamax  Hypertension: Continue with hydralazine and Coreg  Hyperlipidemia history of CVA: Continue with Lipitor  GERD: Continue with PPI Tobacco use: Encourage cessation History of left BKA Debility: Patient not always consistent with hemodialysis due to subjective reasons.  Patient also not very compliant with physical therapist.  Psych following for ACT therapy.  Depression:  Continue with amitriptyline and Cymbalta    Estimated body mass index is 30.49 kg/m as calculated from the following:   Height as of this encounter: 5\' 10"  (1.778 m).   Weight as of this encounter: 96.4 kg.   DVT prophylaxis: Heparin.  Code Status: Full code Family Communication: Care discussed with patient.  Disposition Plan:  Status is: Inpatient Remains inpatient appropriate because: needs to be able to tolerates HD sitting chair.     Consultants:  Orthopedic surgery Infectious disease Nephrology Vascular surgery Psychiatry  Procedures:  Vascular ultrasound duplex dialysis access 09/07/2021 Fistula revision branch ligation per Dr. Unk Lightning 09/19/2021- Left hip debridement, application of wound VAC by Dr.  [name] 08/28/2021    Antimicrobials:   Daptomycin Cefazolin  Subjective: No new complaints.  I explain to her importance of sitting chair for HD>    Objective: Vitals:   03/11/22 1011 03/11/22 1637 03/11/22 2137 03/12/22 0813  BP: (!) 157/54 (!) 156/59 (!) 153/57 (!) 180/59  Pulse: 66 62 65 (!) 59  Resp: 17 17 18 18   Temp: 98.3 F (36.8 C) 98.3 F (36.8 C) 98.7 F (37.1 C) 98.5 F (36.9 C)  TempSrc:   Oral Oral  SpO2: 97% 99% 98% 100%  Weight:      Height:        Intake/Output Summary (Last 24 hours) at 03/12/2022 1428 Last data filed at 03/12/2022 1300 Gross per 24 hour  Intake 460 ml  Output 200 ml  Net 260 ml   Filed Weights   03/07/22 1340 03/10/22 0815 03/10/22 1310  Weight: 95.9 kg 100.9 kg 96.4 kg    Examination:  General exam: Appears calm and comfortable  Respiratory system: Clear to auscultation. Respiratory effort normal. Cardiovascular system: S1 & S2 heard, RRR.  Gastrointestinal system: Abdomen is nondistended, soft and nontender.  Central nervous system: Alert and oriented.  Extremities: trace edema, Left BKA   Data Reviewed: I have personally reviewed following labs and imaging studies  CBC: Recent Labs  Lab 03/10/22 0838  WBC 8.3  HGB 9.6*  HCT 29.3*  MCV 90.7  PLT 161   Basic Metabolic Panel: Recent Labs  Lab 03/07/22 1050 03/10/22 0838  NA 128* 128*  K 4.8 5.2*  CL 90* 90*  CO2 23 24  GLUCOSE 136* 182*  BUN 70* 86*  CREATININE 5.40* 5.93*  CALCIUM 9.0 8.7*  PHOS 5.7* 5.9*   GFR: Estimated Creatinine Clearance: 13.6 mL/min (A) (by C-G formula based on SCr of 5.93 mg/dL (H)). Liver Function Tests: Recent Labs  Lab 03/07/22 1050 03/10/22 0838  ALBUMIN 3.8 3.5   No results for input(s): "LIPASE", "AMYLASE" in the last 168 hours. No results for input(s): "AMMONIA" in the last 168 hours. Coagulation Profile: No results for input(s): "INR", "PROTIME" in the last 168 hours. Cardiac Enzymes: No results for input(s): "CKTOTAL", "CKMB", "CKMBINDEX",  "TROPONINI" in the last 168 hours. BNP (last 3 results) No results for input(s): "PROBNP" in the last 8760 hours. HbA1C: No results for input(s): "HGBA1C" in the last 72 hours. CBG: Recent Labs  Lab 03/11/22 1142 03/11/22 1634 03/11/22 2135 03/12/22 0715 03/12/22 1108  GLUCAP 227* 138* 196* 96 116*   Lipid Profile: No results for input(s): "CHOL", "HDL", "LDLCALC", "TRIG", "CHOLHDL", "LDLDIRECT" in the last 72 hours. Thyroid Function Tests: No results for input(s): "TSH", "T4TOTAL", "FREET4", "T3FREE", "THYROIDAB" in the last 72 hours. Anemia Panel: No results for input(s): "VITAMINB12", "FOLATE", "FERRITIN", "TIBC", "IRON", "RETICCTPCT" in the last 72 hours. Sepsis Labs: No results for input(s): "PROCALCITON", "LATICACIDVEN" in the last 168 hours.  No results found for this or any previous visit (from the past 240 hour(s)).       Radiology Studies: No results found.      Scheduled Meds:  (feeding supplement) PROSource Plus  30 mL Oral BID BM   alosetron  1 mg Oral BID   amitriptyline  50 mg Oral QHS   atorvastatin  40 mg Oral q1800   calcitRIOL  0.25 mcg Oral Daily   carvedilol  6.25 mg Oral BID   Chlorhexidine Gluconate Cloth  6 each Topical Q0600   darbepoetin (ARANESP) injection - DIALYSIS  60 mcg Subcutaneous Q Fri-1800  docusate sodium  200 mg Oral BID   DULoxetine  60 mg Oral Daily   fentaNYL  1 patch Transdermal Q72H   furosemide  80 mg Oral Daily   hydrALAZINE  50 mg Oral TID   insulin aspart  0-9 Units Subcutaneous TID WC   insulin aspart  6 Units Subcutaneous TID WC   insulin glargine-yfgn  10 Units Subcutaneous QHS   methocarbamol  500 mg Oral QID   multivitamin  1 tablet Oral QHS   pantoprazole  40 mg Oral Daily   sevelamer carbonate  2,400 mg Oral TID WC   sodium zirconium cyclosilicate  10 g Oral Once per day on Sun Tue Thu Sat   topiramate  12.5 mg Oral Daily   Continuous Infusions:  albumin human       LOS: 198 days    Time spent:  35 minutes    Donna Tang A Merlie Noga, MD [facility]   If 7PM-7AM, please contact night-coverage [email or web address]  03/12/2022, 2:28 PM

## 2022-03-12 NOTE — Progress Notes (Signed)
Subjective: No complaints this a.m., dialysis after 2 PM meeting of [facility] staff.  Daughter was in room also.  Noted patient stated she would be able to sit in chair for 4 hours at the outpatient unit with plans for possible discharge next week!!     However today he wants to dialysis in bed and to recliner dialysis Friday  Objective Vital signs in last 24 hours: Vitals:   03/11/22 2137 03/12/22 0813 03/12/22 1545 03/12/22 1600  BP: (!) 153/57 (!) 180/59 (!) 172/58 (!) 176/67  Pulse: 65 (!) 59 63 61  Resp: 18 18 (!) 28 15  Temp: 98.7 F (37.1 C) 98.5 F (36.9 C) 98.6 F (37 C)   TempSrc: Oral Oral Oral   SpO2: 98% 100% 97% 97%  Weight:      Height:       Weight change:   Physical Exam General: [name] Heart: RRR, no MRG Lungs: CTA bilaterally, nonlabored Abdomen: Soft , +BS, NTND Extremities 1+ pitting edema bilateral lower extremities Dialysis Access: RUE AVF + bruit    Dialysis Orders: MWF at [facility], 400/500, EDW 90kg, 2K/2Ca, LUE AVF - No heparin prior to admission, no ESA   Assessment/Plan: 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 Completed 6 wk course of IV Daptomycin on 10/12/21. Per Dr. Jess Barters note 12/28/2021 "NO RESTRICTIONS L HIP".  Chronic L hip/residual limb pain: Hx fractures of L femoral neck (where hardware was removed) as well as distal femur Fx. Pain control per primary.  ESRD: Usual MWF schedule. Previously AVF infiltrated but is working well as of now, is back on regular MWF schedule. Clots HD system without heparin, 4000 unit IV bolus and Heparin 2000 units IV mid-run bolus TIW.  Hyperkalemia: Stable on scheduled lokelma 4 days a week. K5.2  (03/10/22) HTN/volume: Large fluids gains between HD which has been discussed extensively, Ongoing edema bilateral lower extremity, Na low 128 and BP stable currently has been high in the past proved with UF,   Continue to use max UF with HD-plan for UFG 4-5L  on HD. BP meds previously tapered down. If BP doesn't improve after HD, may need to titrate BP meds back up. Anemia of ESRD: Hb trending down. Last tsat 54% from Oct.  Checking Fe studies. Aranesp 60 q Friday -last 12/29. recently resumed.  Hgb 9.6 (03/10/22) Secondary HPTH: CorrCa ok, no VDRA. Phos 5.9, continue Renvela as binder. Needs diet compliance. Nutrition: ALB 3.5, continue protein supplements.  Renal diet w/fluid restrictions.   AVF dysfunction (resolved): S/p branch ligation 09/19/2021  DM2: Insulin per primary.   Dispo: SNF recommended, but she declined. Not home HD candidate. Last dialyzed in recliner on 11/04/21.  Continues to refuse dialysis in recliner.  Today wants to try recliner dialysis Friday 1/12. /continues to refuse various aspects of care. Per Renal Navigator notes there are no clinics that will accept patient unless she will have HD in chair. Ultimately patient is making her own choices that are prolonging hospitalization.     patient had multidisciplinary meeting today in her room with 11 members of her care staff including Dr. Hulen Skains Chief medical Officer and patient's daughter [name]. = Patient  thinks she can sit up in dialysis chair for 4 hours and tentative and to be discharged to home with home health services arranged and transportation to and from HD.  Agreeable to sit in HD chair 1/12 and 1/15 possible discharge 1/17  Ernest Haber, PA-C  [facility] Beeper [phone] 03/12/2022,4:33 PM  LOS: 198 days   Labs: Basic Metabolic Panel: Recent Labs  Lab 03/07/22 1050 03/10/22 0838 03/12/22 1605  NA 128* 128* 126*  K 4.8 5.2* 5.0  CL 90* 90* 88*  CO2 23 24 25   GLUCOSE 136* 182* 126*  BUN 70* 86* 69*  CREATININE 5.40* 5.93* 5.32*  CALCIUM 9.0 8.7* 8.6*  PHOS 5.7* 5.9*  --    Liver Function Tests: Recent Labs  Lab 03/07/22 1050 03/10/22 0838  ALBUMIN 3.8 3.5   No results for input(s): "LIPASE", "AMYLASE" in the last 168 hours. No results  for input(s): "AMMONIA" in the last 168 hours. CBC: Recent Labs  Lab 03/10/22 0838  WBC 8.3  HGB 9.6*  HCT 29.3*  MCV 90.7  PLT 208   Cardiac Enzymes: No results for input(s): "CKTOTAL", "CKMB", "CKMBINDEX", "TROPONINI" in the last 168 hours. CBG: Recent Labs  Lab 03/11/22 1142 03/11/22 1634 03/11/22 2135 03/12/22 0715 03/12/22 1108  GLUCAP 227* 138* 196* 96 116*    Studies/Results: No results found. Medications:  albumin human      (feeding supplement) PROSource Plus  30 mL Oral BID BM   alosetron  1 mg Oral BID   amitriptyline  50 mg Oral QHS   atorvastatin  40 mg Oral q1800   calcitRIOL  0.25 mcg Oral Daily   carvedilol  6.25 mg Oral BID   Chlorhexidine Gluconate Cloth  6 each Topical Q0600   darbepoetin (ARANESP) injection - DIALYSIS  60 mcg Subcutaneous Q Fri-1800   docusate sodium  200 mg Oral BID   DULoxetine  60 mg Oral Daily   fentaNYL  1 patch Transdermal Q72H   furosemide  80 mg Oral Daily   hydrALAZINE  50 mg Oral TID   insulin aspart  0-9 Units Subcutaneous TID WC   insulin aspart  6 Units Subcutaneous TID WC   insulin glargine-yfgn  10 Units Subcutaneous QHS   methocarbamol  500 mg Oral QID   multivitamin  1 tablet Oral QHS   pantoprazole  40 mg Oral Daily   sevelamer carbonate  2,400 mg Oral TID WC   sodium zirconium cyclosilicate  10 g Oral Once per day on Sun Tue Thu Sat   topiramate  12.5 mg Oral Daily

## 2022-03-12 NOTE — TOC Progression Note (Addendum)
Transition of Care [facility]) - Initial/Assessment Note    Patient Details  Name: [name] MRN: [id number] Date of Birth: February 03, 1966  Transition of Care [facility]) CM/SW Contact:    Milinda Antis, [place] Phone Number: 03/12/2022, 2:45 PM  Clinical Narrative:                 Multidisciplinary meeting held with patient and daughter, [name].  Present were LCSW Kendall Justo, RNCM Renella Cunas, Dialysis Coordinator Melven Sartorius, Chief Medical Officer Dr. Hulen Skains, Nephrologist- Dr. Jonnie Finner and Renal PA [place]; [place] and Elana Alm, Physical Therapist- Roney Marion, Floor Filer, Dialysis [place].  Disposition discussed with patient.  Patient expressed the desire to return home with home health.  RNCM informed the patient that home health services have been arranged and PCS services will be arranged.  RNCM inquired about transportation to dialysis and the patient agreed for transportation to be scheduled for the first 4 weeks.  The patient and daughter will work with the home health physical therapist towards the patient being transported via personal vehicle.    Patient verbalized to the entire team that she can sit the 4 hours in a chair to receive dialysis.  RNCM, Floor AD, and Dialysis AD informed patient of daughter of plan for the floor charge nurse and dialysis unit to coordinate transportation of patient to and from the dialysis unit so that patient will not have to be in a chair for an extended period of time waiting to be returned to the hospital room.  Renal navigator is following up with the dialysis clinic to verify that the patient's chair time at the outpatient clinic is still available and also inquiring about the patient's ability to return to the clinic without sitting in a dialysis chair at the hospital as patient has verbalized more than once that she can sit for the treatment.    Patient declined to sit for dialysis today, but is  agreeable to sit both Friday, 03/14/2022 and Monday, 03/17/2022.  Patient agreeable to discharge home with home health on Tuesday, 1/16.  The daughter is in agreement and request that that the patient be transported home via non-emergency transportation.   TOC following.  Expected Discharge Plan: Skilled Nursing Facility Barriers to Discharge: Continued Medical Work up   Patient Goals and CMS Choice Patient states their goals for this hospitalization and ongoing recovery are:: To return home CMS Medicare.gov Compare Post Acute Care list provided to:: Patient Choice offered to / list presented to : NA      Expected Discharge Plan and Services   Discharge Planning Services: CM Consult   Living arrangements for the past 2 months: Single Family Home                                      Prior Living Arrangements/Services Living arrangements for the past 2 months: Single Family Home Lives with:: Adult Children Patient language and need for interpreter reviewed:: Yes Do you feel safe going back to the place where you live?: Yes      Need for Family Participation in Patient Care: Yes (Comment) Care giver support system in place?: Yes (comment) Current home services: DME ([facility], w/c) Criminal Activity/Legal Involvement Pertinent to Current Situation/Hospitalization: No - Comment as needed  Activities of Daily Living Home Assistive Devices/Equipment: Eyeglasses, Wheelchair ADL Screening (condition at time of admission) Patient's cognitive ability  adequate to safely complete daily activities?: Yes Is the patient deaf or have difficulty hearing?: No Does the patient have difficulty seeing, even when wearing glasses/contacts?: No Does the patient have difficulty concentrating, remembering, or making decisions?: No Patient able to express need for assistance with ADLs?: Yes Does the patient have difficulty dressing or bathing?: Yes Independently performs ADLs?:  No Communication: Independent Dressing (OT): Independent Is this a change from baseline?: Pre-admission baseline Grooming: Independent Feeding: Independent Bathing: Needs assistance Is this a change from baseline?: Pre-admission baseline Toileting: Needs assistance Is this a change from baseline?: Pre-admission baseline In/Out Bed: Needs assistance, Dependent Is this a change from baseline?: Pre-admission baseline Walks in Home: Dependent Is this a change from baseline?: Pre-admission baseline Does the patient have difficulty walking or climbing stairs?: Yes Weakness of Legs: Both Weakness of Arms/Hands: Both  Permission Sought/Granted Permission sought to share information with : Case Manager, Family Supports Permission granted to share information with : Yes, Verbal Permission Granted              Emotional Assessment Appearance:: Appears stated age Attitude/Demeanor/Rapport: Engaged, Gracious Affect (typically observed): Accepting, Appropriate, Calm, Hopeful Orientation: : Oriented to Self, Oriented to Place, Oriented to  Time, Oriented to Situation Alcohol / Substance Use: Not Applicable Psych Involvement: No (comment)  Admission diagnosis:  Wound infection [T14.8XXA, L08.9] Abscess of left hip [L02.416] Patient Active Problem List   Diagnosis Date Noted   Wound dehiscence, surgical, sequela    Abscess of left hip 08/26/2021   History of CVA (cerebrovascular accident) 72/53/6644   Hardware complicating wound infection ([place])    Subacute osteomyelitis of left femur with abscess     Septic arthritis of hip ([place]) 07/30/2021   Skin abscess L hip 07/28/2021   HCAP (healthcare-associated pneumonia) 07/27/2021   Hyperkalemia 03/47/4259   Metabolic acidemia 56/38/7564   Closed fracture of left distal femur ([place]) 07/22/2021   Type 2 diabetes mellitus with hyperlipidemia ([place]) 07/22/2021   Infection of superficial incisional surgical site after procedure 03/09/2020    Abscess    Hypoglycemia    Essential hypertension    Sleep disturbance    ESRD (end stage renal disease) on dialysis ([place]) 01/24/2020   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[place]) 01/23/2020   Hyponatremia    Constipation    Chronic osteomyelitis involving left ankle and foot ([place])    Ulcer of left foot with necrosis of bone ([place])    Wound infection 12/28/2019   History of [place] amputation of left foot ([place]) 12/25/2017   History of CVA (cerebrovascular accident) without residual deficits 07/04/2017   Cerebral thrombosis with cerebral infarction 04/08/2017   Right sided weakness 04/07/2017   Hyperhidrosis 09/01/2016   Migraine with aura and without status migrainosus, not intractable 07/28/2016   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[place]) 08/04/2014   CKD stage 3 due to type 2 diabetes mellitus ([place]) 06/27/2014   Vitamin D insufficiency 05/08/2014   Obesity (BMI 30.0-34.9) 05/08/2014   Unilateral amputation of left foot ([place]) 05/08/2014   Bursitis of left shoulder 02/14/2014   Neck pain 01/03/2014   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.23) 01/14/2013   Insomnia 08/04/2012   Diabetic neuropathy, painful ([place]) 08/04/2011   Anxiety and depression 05/16/2010   Female stress incontinence 11/01/2007   TOBACCO DEPENDENCE 04/30/2006   GASTROESOPHAGEAL REFLUX, NO ESOPHAGITIS 04/30/2006   Irritable bowel syndrome 04/30/2006   PCP:  [name], MD Pharmacy:   [facility],  [place] - [place] [address] Phone: [phone] Fax: [phone]  [id number] - [place], [place] [address] [place] [address] Phone: [phone] Fax: [phone]     Social Determinants of Health ([place]) Social History: [place]: No Food Insecurity (12/30/2021)  Housing: Low Risk   (12/30/2021)  Transportation Needs: No Transportation Needs (12/30/2021)  Utilities: Not At Risk (12/30/2021)  Depression (PHQ2-9): Medium Risk (03/09/2020)  Tobacco Use: High Risk (09/20/2021)   SDOH Interventions:     Readmission Risk Interventions    07/26/2021    3:15 PM  Readmission Risk Prevention Plan  Transportation Screening Complete  PCP or Specialist Appt within 3-5 Days Complete  HRI or Home Care Consult Complete  Palliative Care Screening Not Applicable  Medication Review (RN Care Manager) Referral to Pharmacy

## 2022-03-12 NOTE — TOC Progression Note (Addendum)
Transition of Care [facility]) - Progression Note    Patient Details  Name: [name] MRN: [id number] Date of Birth: April 15, 1965  Transition of Care [facility]) CM/SW Contact  Tom-Johnson, Renea Ee, RN Phone Number: 03/12/2022, 3:43 PM  Clinical Narrative:     CM attended multidisciplinary meeting today to discuss discharge disposition. Patient voiced she is able to sit 4 hrs for dialysis and in agreement with the team to be discharged home on Tuesday 03/18/22. Daughter at bedside and in agreement as well.  CM called and spoke with Jeanette Caprice with [facility] [phone]) about [place] for patient at discharge. [place] emailed [phone] form to CM for MD to fill out and return and then an assessment will be done and PCS assigned to patient. Form received through email, MD notified, requested to leave on chart.  CM notified Caryl Pina with [facility] of tentative discharge plan for 03/18/22 as agreed today and CM requested an Aide or RN present at patient's resident day of discharge for safety and assessment. Caryl Pina will check and get back with CM d/t billing conflict.  CM spoke with Varney Biles at [facility] [phone]) about arranging transportation for patient to and from dialysis at discharge. Varney Biles states she will notify patient's case worker who will call patient to do an assessment and then schedule transportation. CM notified Varney Biles that patient will be needing wheelchair accessible transportation.  CM will continue to follow as patient progresses with care towards discharge.       Expected Discharge Plan: [place] Barriers to Discharge: Continued Medical Work up  Expected Discharge Plan and Services   Discharge Planning Services: CM Consult   Living arrangements for the past 2 months: [place] Determinants of Health (SDOH) Interventions [place]: No  Food Insecurity (12/30/2021)  Housing: Low Risk  (12/30/2021)  Transportation Needs: No Transportation Needs (12/30/2021)  Utilities: Not At Risk (12/30/2021)  Depression (PHQ2-9): Medium Risk (03/09/2020)  Tobacco Use: High Risk (09/20/2021)    Readmission Risk Interventions    07/26/2021    3:15 PM  Readmission Risk Prevention Plan  Transportation Screening Complete  PCP or Specialist Appt within 3-5 Days Complete  HRI or Home Care Consult Complete  Palliative Care Screening Not Applicable  Medication Review (RN Care Manager) Referral to Pharmacy

## 2022-03-13 DIAGNOSIS — M86252 Subacute osteomyelitis, left femur: Secondary | ICD-10-CM | POA: Diagnosis not present

## 2022-03-13 LAB — GLUCOSE, CAPILLARY
Glucose-Capillary: 141 mg/dL — ABNORMAL HIGH (ref 70–99)
Glucose-Capillary: 140 mg/dL — ABNORMAL HIGH (ref 70–99)
Glucose-Capillary: 198 mg/dL — ABNORMAL HIGH (ref 70–99)
Glucose-Capillary: 288 mg/dL — ABNORMAL HIGH (ref 70–99)

## 2022-03-13 NOTE — Progress Notes (Signed)
Occupational Therapy Treatment Patient Details Name: [name] MRN: [id number] DOB: 01/27/1966 Today's Date: 03/13/2022   History of present illness 57 y.o. female presented to ED 6/26 from dialysis with increased bloody drainage from L hip wound. 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 s/p hardware removal of left hip with partial resection of tuft tissue and femure that were nonviable on 08/10/21 Discharged home 6/23. underwent L hip debridement and wound vac placement 6/28; +Left intertrochanteric non-union,  Fracture of left distal femur  PMH: hypertension, hyperlipidemia, ESRD on HD MWF, history of left BKA in 2021, depression/anxiety, stroke, tobacco use, T2DM,  insomnia, chronic pain syndrome.   OT comments  Patient plan of care reviewed, and goals adjusted.  Patient with incremental progress toward her patient focused goals.  OT will continue efforts in the acute setting to address her deficits listed below, and help assist with an eventual transition home.  Patient is complaining of back pain, and did need closer to Mod A to sit EOB.  Able to sit unsupported for 10 min for light grooming and upper body ADL with setup and up to [place].  [place] disposition upgraded to [facility] OT.   Recommendations for follow up therapy are one component of a multi-disciplinary discharge planning process, led by the attending physician.  Recommendations may be updated based on patient status, additional functional criteria and insurance authorization.    Follow Up Recommendations  Home health OT     Assistance Recommended at Discharge Intermittent Supervision/Assistance  Patient can return home with the following  A lot of help with bathing/dressing/bathroom;Assistance with cooking/housework;Assist for transportation;Help with stairs or ramp for entrance;Two people to help with walking and/or transfers   Equipment Recommendations  None recommended by OT     Recommendations for Other Services      Precautions / Restrictions Precautions Precautions: Fall Precaution Comments: L BKA (baseline) Restrictions Weight Bearing Restrictions: No LLE Weight Bearing: Weight bearing as tolerated       Mobility Bed Mobility Overal bed mobility: Needs Assistance Bed Mobility: Supine to Sit, Sit to Supine     Supine to sit: Mod assist, HOB elevated Sit to supine: Min assist   General bed mobility comments: patient using momentum    Transfers                         Balance Overall balance assessment: Needs assistance Sitting-balance support: Feet supported, No upper extremity supported Sitting balance-[name] Scale: Good Sitting balance - Comments: initial Min A, then supervision                                   ADL either performed or assessed with clinical judgement   ADL                   Upper Body Dressing : Minimal assistance;Sitting   Lower Body Dressing: Sitting/lateral leans;Moderate assistance   Toilet Transfer: Moderate assistance;+2 for safety/equipment;Maximal assistance                  Extremity/Trunk Assessment Upper Extremity Assessment Upper Extremity Assessment: Generalized weakness   Lower Extremity Assessment Lower Extremity Assessment: Defer to PT evaluation   Cervical / Trunk Assessment Cervical / Trunk Assessment: Kyphotic    Vision Baseline Vision/History: 1 Wears glasses Patient Visual Report: No change from baseline     Perception Perception Perception:  Within Functional Limits   Praxis Praxis Praxis: Intact    Cognition Arousal/Alertness: Awake/alert Behavior During Therapy: WFL for tasks assessed/performed Overall Cognitive Status: Within Functional Limits for tasks assessed                                                       General Comments  VSS on RA    Pertinent Vitals/ Pain       Pain Assessment Pain Assessment:  Faces Faces Pain Scale: Hurts little more Pain Location: back Pain Descriptors / Indicators: Discomfort, Nagging Pain Intervention(s): Monitored during session                                                          Frequency  Min 2X/week        Progress Toward Goals  OT Goals(current goals can now be found in the care plan section)  Progress towards OT goals: Progressing toward goals  Acute Rehab OT Goals OT Goal Formulation: With patient/family Time For Goal Achievement: 03/24/22 Potential to Achieve Goals: Fair ADL Goals Pt Will Perform Lower Body Dressing: with min guard assist;sitting/lateral leans Pt Will Transfer to Toilet: with min assist;with transfer board;bedside commode Additional ADL Goal #1: Patient will tolerate sitting up for 4 hours to increase ind with ADL and toileting  Plan Discharge plan remains appropriate;Frequency remains appropriate    Co-evaluation                 AM-PAC OT "6 Clicks" Daily Activity     Outcome Measure   Help from another person eating meals?: None Help from another person taking care of personal grooming?: A Little Help from another person toileting, which includes using toliet, bedpan, or urinal?: A Lot Help from another person bathing (including washing, rinsing, drying)?: A Lot Help from another person to put on and taking off regular upper body clothing?: A Little Help from another person to put on and taking off regular lower body clothing?: A Lot 6 Click Score: 16    End of Session    OT Visit Diagnosis: Muscle weakness (generalized) (M62.81);Unsteadiness on feet (R26.81);History of falling (Z91.81)   Activity Tolerance Patient tolerated treatment well   Patient Left in bed;with call bell/phone within reach   Nurse Communication Mobility status        Time: 9169-4503 OT Time Calculation (min): 22 min  Charges: OT General Charges $OT Visit: 1 Visit OT Treatments $Self  Care/Home Management : 8-22 mins  03/13/2022  RP, OTR/L  Acute Rehabilitation Services  Office:  [phone]   [name] 03/13/2022, 11:38 AM

## 2022-03-13 NOTE — Progress Notes (Signed)
PROGRESS NOTE    [name]  ZOX:[id number] DOB: September 08, 1965 DOA: 08/26/2021 PCP: [name], MD   Brief Narrative: 57 year old with past medical history significant for hypertension, hyperlipidemia, diabetes type 2, ESRD on hemodialysis, status post left BKA for chronic calcaneus osteomyelitis on 01/12/2020, bipolar disorder, left distal fibular fracture after a fall on 07/19/2021 deemed nonoperative, admission from 08/03/2021 until 08/23/2021 for left femoral osteomyelitis requiring partial resection of femoral and rearrangement of bony fragments with subsequent antibiotics treatment presented to hospital with left hip bleeding and was found to have subacute osteomyelitis and abscess of the left femoral.  Orthopedic/ID were consulted and patient was a started on broad-spectrum antibiotics.  Patient underwent multiple I&D's by Dr. [name] along with local tissue rearrangement.  Patient was also seen by vascular surgery who did fistula branch ligation.  Patient has completed 6 weeks course of daptomycin on 80/90/2023.  She has been unwilling  to complete hemodialysis in chair and outpatient hemodialysis unit required she gets HD in chair prior to accept her. .  She is otherwise medically stable for discharge.     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  Active Problems:   ESRD (end stage renal disease) on dialysis ([place])   Type 2 diabetes mellitus with hyperlipidemia (HCC)   History of CVA (cerebrovascular accident)   Essential hypertension   Anxiety and depression   GASTROESOPHAGEAL REFLUX, NO ESOPHAGITIS   TOBACCO DEPENDENCE   Obesity (BMI 30.0-34.9)  1-Subacute osteomyelitis of the left femur with abscess Infected left hip hardware status post removal -Patient was a started on cefazolin.  Orthopedic surgery consulted.  ID consulted.  Patient underwent hardware removal on 6/10 prior to admission with I and D on 6/28 and 7/1. -Has completed  6 weeks of Daptomycin.  2-Left intertrochanteric fracture: This was previously managed with IM nail placement 2021. Hardware removed secondary to above  3-ESRD on hemodialysis: Nephrology following Patient's need to be able to sit on chair for hemodialysis to be able to set up outpatient hemodialysis. Plan for patient to seat in chair for HD tomorrow.   Anasarca: Improved with hemodialysis.  Hyponatremia: Management with hemodialysis.  Hypokalemia, Hyperkalemia: Management with hemodialysis Anemia  of chronic disease, Acute blood loss anemia: In the setting of CKD in addition to blood loss from wound.  She has received a total of 2 units of packed red blood cell during this hospitalization  Diabetes Type 2;  Uncontrolled with hyperglycemia.  Recent A1c 7.1 Continue with Semglee and NovoLog   Migraines headache: Continue with Topamax  Hypertension: Continue with hydralazine and Coreg  Hyperlipidemia history of CVA: Continue with Lipitor  GERD: Continue with PPI Tobacco use: Encourage cessation History of left BKA Debility: Patient not always consistent with hemodialysis due to subjective reasons.  Patient also not very compliant with physical therapist.  Psych following for ACT therapy.  Depression:  Continue with amitriptyline and Cymbalta    Estimated body mass index is 29.2 kg/m as calculated from the following:   Height as of this encounter: 5\' 10"  (1.778 m).   Weight as of this encounter: 92.3 kg.   DVT prophylaxis: Heparin.  Code Status: Full code Family Communication: Care discussed with patient.  Disposition Plan:  Status is: Inpatient Remains inpatient appropriate because: needs to be able to tolerates HD sitting chair.     Consultants:  Orthopedic surgery Infectious disease Nephrology Vascular surgery Psychiatry  Procedures:  Vascular ultrasound duplex dialysis access  09/07/2021 Fistula revision branch ligation per Dr. Unk Lightning 09/19/2021- Left  hip debridement, application of wound VAC by Dr. [name] 08/28/2021    Antimicrobials:  Daptomycin Cefazolin  Subjective: No new complaints. Plan for patient to go to HD in chair.   Objective: Vitals:   03/12/22 2049 03/12/22 2125 03/13/22 0502 03/13/22 0930  BP: (!) 158/58 (!) 144/50 (!) 123/48 (!) 151/54  Pulse: 67 69 63 73  Resp: 17 18 16 17   Temp: 97.6 F (36.4 C) 97.7 F (36.5 C) 98 F (36.7 C) 98.4 F (36.9 C)  TempSrc:  Oral Oral   SpO2: 100% 98% 97% 100%  Weight: 92.3 kg     Height:        Intake/Output Summary (Last 24 hours) at 03/13/2022 1413 Last data filed at 03/13/2022 1300 Gross per 24 hour  Intake 960 ml  Output 4100 ml  Net -3140 ml    Filed Weights   03/10/22 0815 03/10/22 1310 03/12/22 2049  Weight: 100.9 kg 96.4 kg 92.3 kg    Examination:  General exam: NAD Respiratory system: CTA Cardiovascular system: S 1, S 2 RRR Gastrointestinal system: BS present, Soft, nt Central nervous system: Alert, follows command Extremities: Trace edema, Left BKA   Data Reviewed: I have personally reviewed following labs and imaging studies  CBC: Recent Labs  Lab 03/10/22 0838  WBC 8.3  HGB 9.6*  HCT 29.3*  MCV 90.7  PLT 073    Basic Metabolic Panel: Recent Labs  Lab 03/07/22 1050 03/10/22 0838 03/12/22 1605  NA 128* 128* 126*  K 4.8 5.2* 5.0  CL 90* 90* 88*  CO2 23 24 25   GLUCOSE 136* 182* 126*  BUN 70* 86* 69*  CREATININE 5.40* 5.93* 5.32*  CALCIUM 9.0 8.7* 8.6*  PHOS 5.7* 5.9*  --     GFR: Estimated Creatinine Clearance: 14.9 mL/min (A) (by C-G formula based on SCr of 5.32 mg/dL (H)). Liver Function Tests: Recent Labs  Lab 03/07/22 1050 03/10/22 0838  ALBUMIN 3.8 3.5    No results for input(s): "LIPASE", "AMYLASE" in the last 168 hours. No results for input(s): "AMMONIA" in the last 168 hours. Coagulation Profile: No results for input(s): "INR", "PROTIME" in the last 168 hours. Cardiac Enzymes: No results for input(s):  "CKTOTAL", "CKMB", "CKMBINDEX", "TROPONINI" in the last 168 hours. BNP (last 3 results) No results for input(s): "PROBNP" in the last 8760 hours. HbA1C: No results for input(s): "HGBA1C" in the last 72 hours. CBG: Recent Labs  Lab 03/12/22 0715 03/12/22 1108 03/12/22 2125 03/13/22 0721 03/13/22 1123  GLUCAP 96 116* 224* 141* 140*    Lipid Profile: No results for input(s): "CHOL", "HDL", "LDLCALC", "TRIG", "CHOLHDL", "LDLDIRECT" in the last 72 hours. Thyroid Function Tests: No results for input(s): "TSH", "T4TOTAL", "FREET4", "T3FREE", "THYROIDAB" in the last 72 hours. Anemia Panel: No results for input(s): "VITAMINB12", "FOLATE", "FERRITIN", "TIBC", "IRON", "RETICCTPCT" in the last 72 hours. Sepsis Labs: No results for input(s): "PROCALCITON", "LATICACIDVEN" in the last 168 hours.  No results found for this or any previous visit (from the past 240 hour(s)).       Radiology Studies: No results found.      Scheduled Meds:  (feeding supplement) PROSource Plus  30 mL Oral BID BM   alosetron  1 mg Oral BID   amitriptyline  50 mg Oral QHS   atorvastatin  40 mg Oral q1800   calcitRIOL  0.25 mcg Oral Daily   carvedilol  6.25 mg Oral BID   Chlorhexidine Gluconate Cloth  6 each Topical Q0600   darbepoetin (ARANESP) injection - DIALYSIS  60 mcg Subcutaneous Q Fri-1800   docusate sodium  200 mg Oral BID   DULoxetine  60 mg Oral Daily   fentaNYL  1 patch Transdermal Q72H   furosemide  80 mg Oral Daily   hydrALAZINE  50 mg Oral TID   insulin aspart  0-9 Units Subcutaneous TID WC   insulin aspart  6 Units Subcutaneous TID WC   insulin glargine-yfgn  10 Units Subcutaneous QHS   methocarbamol  500 mg Oral QID   multivitamin  1 tablet Oral QHS   pantoprazole  40 mg Oral Daily   sevelamer carbonate  2,400 mg Oral TID WC   sodium zirconium cyclosilicate  10 g Oral Once per day on Sun Tue Thu Sat   topiramate  12.5 mg Oral Daily   Continuous Infusions:  albumin human        LOS: 199 days    Time spent: 35 minutes    Coleson Kant A Telia Amundson, MD [facility]   If 7PM-7AM, please contact night-coverage [email or web address]  03/13/2022, 2:13 PM

## 2022-03-13 NOTE — Progress Notes (Signed)
Subjective: No complaints.  As noted yesterday plans for dialysis in recliner tomorrow.  Tolerated dialysis yesterday= 4 L UF without difficulty but was not in chair  Objective Vital signs in last 24 hours: Vitals:   03/12/22 2049 03/12/22 2125 03/13/22 0502 03/13/22 0930  BP: (!) 158/58 (!) 144/50 (!) 123/48 (!) 151/54  Pulse: 67 69 63 73  Resp: 17 18 16 17   Temp: 97.6 F (36.4 C) 97.7 F (36.5 C) 98 F (36.7 C) 98.4 F (36.9 C)  TempSrc:  Oral Oral   SpO2: 100% 98% 97% 100%  Weight: 92.3 kg     Height:       Weight change:   Physical Exam General: [name] Heart: RRR, no MRG Lungs: CTA bilaterally, nonlabored Abdomen: Soft , +BS, NTND Extremities trace pitting edema bilateral lower extremities, including hips Dialysis Access: RUE AVF + bruit    Dialysis Orders: MWF at [facility], 400/500, EDW 90kg, 2K/2Ca, LUE AVF - No heparin prior to admission, no ESA   Assessment/Plan: 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 Completed 6 wk course of IV Daptomycin on 10/12/21. Per Dr. Jess Barters note 12/28/2021 "NO RESTRICTIONS L HIP".  Chronic L hip/residual limb pain: Hx fractures of L femoral neck (where hardware was removed) as well as distal femur Fx. Pain control per primary.  ESRD: Usual MWF schedule. Previously AVF infiltrated but is working well as of now, is back on regular MWF schedule. Clots HD system without heparin, 4000 unit IV bolus and Heparin 2000 units IV mid-run bolus TIW.  Hyperkalemia: Stable on scheduled lokelma 4 days a week. K5.0 (03/12/22) HTN/volume: Large fluids gains between HD which has been discussed extensively, Ongoing edema bilateral lower extremity, Na low 126 and BP stable currently has been high in the past proved with UF,   Continue to use max UF with HD-plan for UFG 4-L on HD. BP meds previously tapered down. If BP doesn't improve after HD, may need to titrate BP meds back up. Anemia of ESRD:  Hb trending down. Last tsat 54% from Oct.  Checking Fe studies. Aranesp 60 q Friday -last 12/29. recently resumed.  Hgb 9.6 (03/10/22) Secondary HPTH: CorrCa ok, no VDRA. Phos 5.9, continue Renvela as binder. Needs diet compliance. Nutrition: ALB 3.5, continue protein supplements.  Renal diet w/fluid restrictions.   AVF dysfunction (resolved): S/p branch ligation 09/19/2021  DM2: Insulin per primary.   Dispo: SNF recommended, but she declined. Not home HD candidate. Last dialyzed in recliner on 11/04/21.  Continues to refuse dialysis in recliner.  Today wants to try recliner dialysis Friday 1/12. /continues to refuse various aspects of care. Per Renal Navigator notes there are no clinics that will accept patient unless she will have HD in chair. Ultimately patient is making her own choices that are prolonging hospitalization.      patient had multidisciplinary meeting 03/12/2022 in her room with 11 members of her care staff including Dr. Hulen Skains Chief medical Officer and patient's daughter [name]. = Patient  thinks she can sit up in dialysis chair for 4 hours and tentative and to be discharged to home with home health services arranged and transportation to and from HD.  Agreeable to sit in HD chair 1/12 and 1/15 possible discharge 1/17  Ernest Haber, PA-C [place] [phone] 03/13/2022,3:14 PM  LOS: 199 days   Labs: Basic Metabolic Panel: Recent Labs  Lab 03/07/22 1050 03/10/22 0838 03/12/22 1605  NA 128* 128*  126*  K 4.8 5.2* 5.0  CL 90* 90* 88*  CO2 23 24 25   GLUCOSE 136* 182* 126*  BUN 70* 86* 69*  CREATININE 5.40* 5.93* 5.32*  CALCIUM 9.0 8.7* 8.6*  PHOS 5.7* 5.9*  --    Liver Function Tests: Recent Labs  Lab 03/07/22 1050 03/10/22 0838  ALBUMIN 3.8 3.5   No results for input(s): "LIPASE", "AMYLASE" in the last 168 hours. No results for input(s): "AMMONIA" in the last 168 hours. CBC: Recent Labs  Lab 03/10/22 0838  WBC 8.3  HGB 9.6*  HCT 29.3*   MCV 90.7  PLT 208   Cardiac Enzymes: No results for input(s): "CKTOTAL", "CKMB", "CKMBINDEX", "TROPONINI" in the last 168 hours. CBG: Recent Labs  Lab 03/12/22 0715 03/12/22 1108 03/12/22 2125 03/13/22 0721 03/13/22 1123  GLUCAP 96 116* 224* 141* 140*    Studies/Results: No results found. Medications:  albumin human      (feeding supplement) PROSource Plus  30 mL Oral BID BM   alosetron  1 mg Oral BID   amitriptyline  50 mg Oral QHS   atorvastatin  40 mg Oral q1800   calcitRIOL  0.25 mcg Oral Daily   carvedilol  6.25 mg Oral BID   Chlorhexidine Gluconate Cloth  6 each Topical Q0600   darbepoetin (ARANESP) injection - DIALYSIS  60 mcg Subcutaneous Q Fri-1800   docusate sodium  200 mg Oral BID   DULoxetine  60 mg Oral Daily   fentaNYL  1 patch Transdermal Q72H   furosemide  80 mg Oral Daily   hydrALAZINE  50 mg Oral TID   insulin aspart  0-9 Units Subcutaneous TID WC   insulin aspart  6 Units Subcutaneous TID WC   insulin glargine-yfgn  10 Units Subcutaneous QHS   methocarbamol  500 mg Oral QID   multivitamin  1 tablet Oral QHS   pantoprazole  40 mg Oral Daily   sevelamer carbonate  2,400 mg Oral TID WC   sodium zirconium cyclosilicate  10 g Oral Once per day on Sun Tue Thu Sat   topiramate  12.5 mg Oral Daily

## 2022-03-13 NOTE — Progress Notes (Signed)
Spoke to clinic manager at [facility] this morning. Update provided from yesterday's meeting. Clinic manager confirms that pt will need to receive HD in the chair prior to being accepted back at out-pt clinic. Pt's schedule at d/c will be MWF 11:40 arrival for 12:00 chair time. Clinic advised that case management is working on getting pt approved for w/c transportation at d/c. Met with pt at bedside to go over out-pt schedule at d/c and plans for HD in the chair tomorrow. Spoke to inpt HD AD, Laverda Sorenson, who states staff plan to call for pt tomorrow morning at 7:30 am. It was suggested that pt order breakfast tonight for delivery tomorrow morning at 6:30 am so pt can eat prior to treatment. Pt, pt's RN, and 80M AD made aware of this info as well so pt can be ready for treatment in the morning. Update provided to treatment team as well. Clinic aware of goal for d/c on Tuesday and pt resume at clinic on Wednesday. Will assist as needed.   Melven Sartorius Renal Navigator [phone]

## 2022-03-13 NOTE — Progress Notes (Signed)
Physical Therapy Treatment Patient Details Name: [name] MRN: [id number] DOB: Feb 25, 1966 Today's Date: 03/13/2022   History of Present Illness 57 y.o. female presented to ED 6/26 from dialysis with increased bloody drainage from L hip wound. 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 s/p hardware removal of left hip with partial resection of tuft tissue and femure that were nonviable on 08/10/21 Discharged home 6/23. underwent L hip debridement and wound vac placement 6/28; +Left intertrochanteric non-union,  Fracture of left distal femur  PMH: hypertension, hyperlipidemia, ESRD on HD MWF, history of left BKA in 2021, depression/anxiety, stroke, tobacco use, T2DM,  insomnia, chronic pain syndrome.    PT Comments    Agreeable to PT today, requests training for squat pivot transfer. Attempted with use of prosthesis on, however Lt hip too externally rotated to achieve foot flat, limiting to sliding board transfers which was done with set-up assist only today. Performed to/from bed/wheelchair at min guard level during transfer, min assist to place/remove sliding board only. Pt able to teach back sequencing. Reviewed stretching and positioning for LLE to prevent further ER contracture. Patient will continue to benefit from skilled physical therapy services to further improve independence with functional mobility.    Recommendations for follow up therapy are one component of a multi-disciplinary discharge planning process, led by the attending physician.  Recommendations may be updated based on patient status, additional functional criteria and insurance authorization.  Follow Up Recommendations  Home health PT Can patient physically be transported by private vehicle: No   Assistance Recommended at Discharge Intermittent Supervision/Assistance  Patient can return home with the following A little help with walking and/or transfers;Assistance with  cooking/housework;Assist for transportation   Equipment Recommendations  Wheelchair (measurements PT);Wheelchair cushion (measurements PT) (get current wheelchair fixed? long sliding board, drop-arm BSC, van trasnport for HD)    Recommendations for Other Services Other (comment) (Psychotherapy)     Precautions / Restrictions Precautions Precautions: Fall Precaution Comments: L BKA (baseline) Restrictions Weight Bearing Restrictions: No LLE Weight Bearing: Weight bearing as tolerated Other Position/Activity Restrictions: L distal femur fx; wearing new shrinker     Mobility  Bed Mobility Overal bed mobility: Needs Assistance Bed Mobility: Supine to Sit, Sit to Supine     Supine to sit: HOB elevated, Min assist     General bed mobility comments: Min assist for patient to pull through therapist hand to rise to EOB with HOB slightly elevated. Requires extra time. No physical assist to return to supine.    Transfers Overall transfer level: Needs assistance Equipment used: Sliding board Transfers: Bed to chair/wheelchair/BSC            Lateral/Scoot Transfers: Min assist General transfer comment: Min assist for sliding board placement only, pt teaching back techniques for recall. Able to scoot left into wheelchair, Rt back into bed. Additionally, attempted squat pivot transfer today. Unable to successfully perform and unlikely to progress acutely with this technique. Her LLE is too externally rotated to place prosthesis in alignment to have foot flat on the floor. Encouraged low load long duration stretches for LLE until foot flat can be achieved. Her RLE is not strong enough for a squat pivot at this time.    Ambulation/Gait                   Geneticist, molecular Details (indicate cue type and reason): Declines  use today.  Modified Rankin (Stroke Patients Only)       Balance Overall balance  assessment: Needs assistance Sitting-balance support: Feet supported, No upper extremity supported Sitting balance-Leahy Scale: Good                                      Cognition Arousal/Alertness: Awake/alert Behavior During Therapy: WFL for tasks assessed/performed Overall Cognitive Status: Within Functional Limits for tasks assessed                                          Exercises Other Exercises Other Exercises: Low load stretch for Lt hip into IR - education    General Comments        Pertinent Vitals/Pain Pain Assessment Pain Assessment: Faces Faces Pain Scale: Hurts little more Pain Location: back Pain Descriptors / Indicators: Discomfort, Nagging Pain Intervention(s): Monitored during session    Home Living                          Prior Function            PT Goals (current goals can now be found in the care plan section) Acute Rehab PT Goals Patient Stated Goal: Would like to go home PT Goal Formulation: With patient Time For Goal Achievement: 03/18/22 Potential to Achieve Goals: Fair Progress towards PT goals: Progressing toward goals    Frequency    Min 3X/week      PT Plan Current plan remains appropriate    Co-evaluation              AM-PAC PT "6 Clicks" Mobility   Outcome Measure  Help needed turning from your back to your side while in a flat bed without using bedrails?: None Help needed moving from lying on your back to sitting on the side of a flat bed without using bedrails?: A Little Help needed moving to and from a bed to a chair (including a wheelchair)?: A Little Help needed standing up from a chair using your arms (e.g., wheelchair or bedside chair)?: Total Help needed to walk in hospital room?: Total Help needed climbing 3-5 steps with a railing? : Total 6 Click Score: 13    End of Session Equipment Utilized During Treatment: Other (comment) (sliding board) Activity  Tolerance: Patient tolerated treatment well Patient left: in bed;with call bell/phone within reach (shrinker on,) Nurse Communication: Mobility status PT Visit Diagnosis: Other abnormalities of gait and mobility (R26.89);Muscle weakness (generalized) (M62.81);History of falling (Z91.81) Pain - Right/Left: Left Pain - part of body:  (back)     Time: 3151-7616 PT Time Calculation (min) (ACUTE ONLY): 28 min  Charges:  $Therapeutic Activity: 23-37 mins                     Candie Mile, PT, DPT Physical Therapist Acute Rehabilitation Services [place]    [name] 03/13/2022, 4:04 PM

## 2022-03-14 DIAGNOSIS — M86252 Subacute osteomyelitis, left femur: Secondary | ICD-10-CM | POA: Diagnosis not present

## 2022-03-14 LAB — GLUCOSE, CAPILLARY
Glucose-Capillary: 148 mg/dL — ABNORMAL HIGH (ref 70–99)
Glucose-Capillary: 209 mg/dL — ABNORMAL HIGH (ref 70–99)
Glucose-Capillary: 156 mg/dL — ABNORMAL HIGH (ref 70–99)

## 2022-03-14 LAB — CBC
HCT: 32.3 % — ABNORMAL LOW (ref 36.0–46.0)
Hemoglobin: 10.6 g/dL — ABNORMAL LOW (ref 12.0–15.0)
MCH: 29.8 pg (ref 26.0–34.0)
MCHC: 32.8 g/dL (ref 30.0–36.0)
MCV: 90.7 fL (ref 80.0–100.0)
Platelets: 204 10*3/uL (ref 150–400)
RBC: 3.56 MIL/uL — ABNORMAL LOW (ref 3.87–5.11)
RDW: 14.5 % (ref 11.5–15.5)
WBC: 7.9 10*3/uL (ref 4.0–10.5)
nRBC: 0 % (ref 0.0–0.2)

## 2022-03-14 LAB — RENAL FUNCTION PANEL
Albumin: 3.7 g/dL (ref 3.5–5.0)
Anion gap: 13 (ref 5–15)
BUN: 61 mg/dL — ABNORMAL HIGH (ref 6–20)
CO2: 24 mmol/L (ref 22–32)
Calcium: 9 mg/dL (ref 8.9–10.3)
Chloride: 94 mmol/L — ABNORMAL LOW (ref 98–111)
Creatinine, Ser: 4.99 mg/dL — ABNORMAL HIGH (ref 0.44–1.00)
GFR, Estimated: 10 mL/min — ABNORMAL LOW (ref >60)
Glucose, Bld: 127 mg/dL — ABNORMAL HIGH (ref 70–99)
Phosphorus: 6.1 mg/dL — ABNORMAL HIGH (ref 2.5–4.6)
Potassium: 4.8 mmol/L (ref 3.5–5.1)
Sodium: 131 mmol/L — ABNORMAL LOW (ref 135–145)

## 2022-03-14 MED ORDER — HEPARIN SODIUM (PORCINE) 1000 UNIT/ML DIALYSIS
4000.0000 [IU] | INTRAMUSCULAR | Status: DC | PRN
  Administered 2022-03-14: 4000 [IU] via INTRAVENOUS_CENTRAL
  Filled 2022-03-14 (×2): qty 4

## 2022-03-14 MED ORDER — PENTAFLUOROPROP-TETRAFLUOROETH EX AERO: 1.0000 | INHALATION_SPRAY | CUTANEOUS | Status: DC | PRN

## 2022-03-14 NOTE — Progress Notes (Cosign Needed Addendum)
[facility] Progress Note   Subjective:    Seen and examined on HD. Patient is receiving HD in the chair and tolerating well so far. UFG set at 4L. Noted multidisciplinary meeting on 03/12/22. Plan to receive HD in chair again on Monday 1/15 and for possible dc on 1/17.  Objective Vitals:   03/14/22 0734 03/14/22 0748 03/14/22 0800 03/14/22 0830  BP: (!) 150/52 101/74 (!) 157/48 (!) 150/49  Pulse: 63 (!) 57 (!) 57 62  Resp: (!) 21 14 11 11   Temp: 97.9 F (36.6 C)     TempSrc:      SpO2: 100% 100% 100%   Weight:      Height:       Physical Exam General: [name] Heart: RRR, no MRG Lungs: CTA bilaterally, nonlabored Abdomen: Soft , +BS, NTND Extremities trace pitting edema bilateral lower extremities, including hips Dialysis Access: RUE AVF + bruit   Filed Weights   03/10/22 0815 03/10/22 1310 03/12/22 2049  Weight: 100.9 kg 96.4 kg 92.3 kg    Intake/Output Summary (Last 24 hours) at 03/14/2022 0929 Last data filed at 03/13/2022 2105 Gross per 24 hour  Intake 300 ml  Output 0 ml  Net 300 ml    Additional Objective Labs: Basic Metabolic Panel: Recent Labs  Lab 03/07/22 1050 03/10/22 0838 03/12/22 1605 03/14/22 0739  NA 128* 128* 126* 131*  K 4.8 5.2* 5.0 4.8  CL 90* 90* 88* 94*  CO2 23 24 25 24   GLUCOSE 136* 182* 126* 127*  BUN 70* 86* 69* 61*  CREATININE 5.40* 5.93* 5.32* 4.99*  CALCIUM 9.0 8.7* 8.6* 9.0  PHOS 5.7* 5.9*  --  6.1*   Liver Function Tests: Recent Labs  Lab 03/07/22 1050 03/10/22 0838 03/14/22 0739  ALBUMIN 3.8 3.5 3.7   No results for input(s): "LIPASE", "AMYLASE" in the last 168 hours. CBC: Recent Labs  Lab 03/10/22 0838 03/14/22 0739  WBC 8.3 7.9  HGB 9.6* 10.6*  HCT 29.3* 32.3*  MCV 90.7 90.7  PLT 208 204   Blood Culture    Component Value Date/Time   SDES TISSUE 08/28/2021 0917   SPECREQUEST LEFT THIGH TISSUE 08/28/2021 0917   CULT  08/28/2021 0917    MODERATE ENTEROCOCCUS FAECIUM VANCOMYCIN  RESISTANT ENTEROCOCCUS ISOLATED NO ANAEROBES ISOLATED Sent to [place] for further susceptibility testing. Performed at [facility], [place] [address], [place], [place] [address]    REPTSTATUS 09/02/2021 FINAL 08/28/2021 4259    Cardiac Enzymes: No results for input(s): "CKTOTAL", "CKMB", "CKMBINDEX", "TROPONINI" in the last 168 hours. CBG: Recent Labs  Lab 03/12/22 2125 03/13/22 0721 03/13/22 1123 03/13/22 1649 03/13/22 2105  GLUCAP 224* 141* 140* 198* 288*   Iron Studies: No results for input(s): "IRON", "TIBC", "TRANSFERRIN", "FERRITIN" in the last 72 hours. Lab Results  Component Value Date   INR 1.1 12/28/2019   INR 1.2 12/17/2018   INR 0.99 04/07/2017   Studies/Results: No results found.  Medications:  albumin human      (feeding supplement) PROSource Plus  30 mL Oral BID BM   alosetron  1 mg Oral BID   amitriptyline  50 mg Oral QHS   atorvastatin  40 mg Oral q1800   calcitRIOL  0.25 mcg Oral Daily   carvedilol  6.25 mg Oral BID   Chlorhexidine Gluconate Cloth  6 each Topical Q0600   darbepoetin (ARANESP) injection - DIALYSIS  60 mcg Subcutaneous Q Fri-1800   docusate sodium  200 mg Oral BID  DULoxetine  60 mg Oral Daily   fentaNYL  1 patch Transdermal Q72H   furosemide  80 mg Oral Daily   hydrALAZINE  50 mg Oral TID   insulin aspart  0-9 Units Subcutaneous TID WC   insulin aspart  6 Units Subcutaneous TID WC   insulin glargine-yfgn  10 Units Subcutaneous QHS   methocarbamol  500 mg Oral QID   multivitamin  1 tablet Oral QHS   pantoprazole  40 mg Oral Daily   sevelamer carbonate  2,400 mg Oral TID WC   sodium zirconium cyclosilicate  10 g Oral Once per day on Sun Tue Thu Sat   topiramate  12.5 mg Oral Daily    Dialysis Orders: MWF at [facility], 400/500, EDW 90kg, 2K/2Ca, LUE AVF - No heparin prior to admission, no ESA  Assessment/Plan: 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 Completed 6 wk  course of IV Daptomycin on 10/12/21. Per Dr. Jess Barters note 12/28/2021 "NO RESTRICTIONS L HIP".  Chronic L hip/residual limb pain: Hx fractures of L femoral neck (where hardware was removed) as well as distal femur Fx. Pain control per primary.  ESRD: Usual MWF schedule. Previously AVF infiltrated but is working well as of now, is back on regular MWF schedule. Clots HD system without heparin, 4000 unit IV bolus and Heparin 2000 units IV mid-run bolus TIW.  Hyperkalemia: Stable on scheduled lokelma 4 days a week. K5.0 (03/12/22) HTN/volume: Large fluids gains between HD which has been discussed extensively, Ongoing edema bilateral lower extremity, Na improving-now 131 and BP stable currently has been high in the past proved with UF,   Continue to use max UF with HD-plan for UFG 4-L on HD.  Anemia of ESRD: Hb trending down. Last tsat 54% from Oct.  Checking Fe studies. Aranesp 60 q Friday -last 12/29. recently resumed.  Hgb 9.6 (03/10/22) Secondary HPTH: CorrCa ok, no VDRA. Phos 5.9, continue Renvela as binder. Needs diet compliance. Nutrition: ALB 3.5, continue protein supplements.  Renal diet w/fluid restrictions.   AVF dysfunction (resolved): S/p branch ligation 09/19/2021  DM2: Insulin per primary.   Dispo: SNF recommended, but she declined. Not home HD candidate. Last dialyzed in recliner on 11/04/21.  Continues to refuse dialysis in recliner.  Today wants to try recliner dialysis Friday 1/12. /continues to refuse various aspects of care. Per Renal Navigator notes there are no clinics that will accept patient unless she will have HD in chair. Ultimately patient is making her own choices that are prolonging hospitalization.    Noted patient had multidisciplinary meeting 03/12/2022 in her room with 11 members of her care staff including Dr. Hulen Skains Chief medical Officer and patient's daughter [name]. Patient  thinks she can sit up in dialysis chair for 4 hours and tentative and to be discharged to home with  home health services arranged and transportation to and from HD. Patient sitting in chair for today's HD and will continue this with HD on Monday 1/15. Plan for possible discharge on 1/17.   Tobie Poet, NP [facility] 03/14/2022,9:29 AM  LOS: 200 days

## 2022-03-14 NOTE — Progress Notes (Signed)
PROGRESS NOTE    [name]  PZW:[id number] DOB: 1965/11/16 DOA: 08/26/2021 PCP: [name], MD   Brief Narrative: 57 year old with past medical history significant for hypertension, hyperlipidemia, diabetes type 2, ESRD on hemodialysis, status post left BKA for chronic calcaneus osteomyelitis on 01/12/2020, bipolar disorder, left distal fibular fracture after a fall on 07/19/2021 deemed nonoperative, admission from 08/03/2021 until 08/23/2021 for left femoral osteomyelitis requiring partial resection of femoral and rearrangement of bony fragments with subsequent antibiotics treatment presented to hospital with left hip bleeding and was found to have subacute osteomyelitis and abscess of the left femoral.  Orthopedic/ID were consulted and patient was a started on broad-spectrum antibiotics.  Patient underwent multiple I&D's by Dr. [name] along with local tissue rearrangement.  Patient was also seen by vascular surgery who did fistula branch ligation.  Patient has completed 6 weeks course of daptomycin on 80/90/2023.  [name] has been unwilling  to complete hemodialysis in chair and outpatient hemodialysis unit required [name] gets HD in chair prior to accept her. .  [name] is otherwise medically stable for discharge.     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  Active Problems:   ESRD (end stage renal disease) on dialysis ([place])   Type 2 diabetes mellitus with hyperlipidemia (HCC)   History of CVA (cerebrovascular accident)   Essential hypertension   Anxiety and depression   GASTROESOPHAGEAL REFLUX, NO ESOPHAGITIS   TOBACCO DEPENDENCE   Obesity (BMI 30.0-34.9)  1-Subacute osteomyelitis of the left femur with abscess Infected left hip hardware status post removal -Patient was a started on cefazolin.  Orthopedic surgery consulted.  ID consulted.  Patient underwent hardware removal on 6/10 prior to admission with I and D on 6/28 and 7/1. -Has completed  6 weeks of Daptomycin.  2-Left intertrochanteric fracture: This was previously managed with IM nail placement 2021. Hardware removed secondary to above  3-ESRD on hemodialysis: Nephrology following Patient's need to be able to sit on chair for hemodialysis to be able to set up outpatient hemodialysis. [name] was able to tolerate HD in chair.   Anasarca: Improved with hemodialysis.  Hyponatremia: Management with hemodialysis.  Hypokalemia, Hyperkalemia: Management with hemodialysis Anemia  of chronic disease, Acute blood loss anemia: In the setting of CKD in addition to blood loss from wound.  [name] has received a total of 2 units of packed red blood cell during this hospitalization  Diabetes Type 2;  Uncontrolled with hyperglycemia.  Recent A1c 7.1 Continue with Semglee and NovoLog   Migraines headache: Continue with Topamax  Hypertension: Continue with hydralazine and Coreg  Hyperlipidemia history of CVA: Continue with Lipitor  GERD: Continue with PPI Tobacco use: Encourage cessation History of left BKA Debility: Patient not always consistent with hemodialysis due to subjective reasons.  Patient also not very compliant with physical therapist.  Psych following for ACT therapy.  Depression:  Continue with amitriptyline and Cymbalta    Estimated body mass index is 29.2 kg/m as calculated from the following:   Height as of this encounter: 5\' 10"  (1.778 m).   Weight as of this encounter: 92.3 kg.   DVT prophylaxis: Heparin.  Code Status: Full code Family Communication: Care discussed with patient.  Disposition Plan:  Status is: Inpatient Remains inpatient appropriate because: plan to discharge patient on Tuesday. HD in chair on Monday     Consultants:  Orthopedic surgery Infectious disease Nephrology Vascular surgery Psychiatry  Procedures:  Vascular ultrasound duplex dialysis access  09/07/2021 Fistula revision branch ligation per Dr. Unk Lightning 09/19/2021- Left hip  debridement, application of wound VAC by Dr. [name] 08/28/2021    Antimicrobials:  Daptomycin Cefazolin  Subjective: [name] was able to tolerates HD in chair. No new complaints.   Objective: Vitals:   03/14/22 1130 03/14/22 1143 03/14/22 1159 03/14/22 1218  BP: (!) 105/42 (!) 113/44 (!) 127/49 (!) 131/49  Pulse: 64 63 65 68  Resp: 11 11 16 17   Temp:   97.8 F (36.6 C) 98.6 F (37 C)  TempSrc:   Oral Oral  SpO2: 100% 100% 100% 99%  Weight:      Height:        Intake/Output Summary (Last 24 hours) at 03/14/2022 1501 Last data filed at 03/14/2022 1143 Gross per 24 hour  Intake 0 ml  Output 3700 ml  Net -3700 ml    Filed Weights   03/10/22 0815 03/10/22 1310 03/12/22 2049  Weight: 100.9 kg 96.4 kg 92.3 kg    Examination:  General exam: NAD Respiratory system: CTA Cardiovascular system: S 1, S 2 RRR Gastrointestinal system: BS present, soft, nt Central nervous system: Alert Extremities: Trace edema, Left BKA   Data Reviewed: I have personally reviewed following labs and imaging studies  CBC: Recent Labs  Lab 03/10/22 0838 03/14/22 0739  WBC 8.3 7.9  HGB 9.6* 10.6*  HCT 29.3* 32.3*  MCV 90.7 90.7  PLT 208 676    Basic Metabolic Panel: Recent Labs  Lab 03/10/22 0838 03/12/22 1605 03/14/22 0739  NA 128* 126* 131*  K 5.2* 5.0 4.8  CL 90* 88* 94*  CO2 24 25 24   GLUCOSE 182* 126* 127*  BUN 86* 69* 61*  CREATININE 5.93* 5.32* 4.99*  CALCIUM 8.7* 8.6* 9.0  PHOS 5.9*  --  6.1*    GFR: Estimated Creatinine Clearance: 15.9 mL/min (A) (by C-G formula based on SCr of 4.99 mg/dL (H)). Liver Function Tests: Recent Labs  Lab 03/10/22 0838 03/14/22 0739  ALBUMIN 3.5 3.7    No results for input(s): "LIPASE", "AMYLASE" in the last 168 hours. No results for input(s): "AMMONIA" in the last 168 hours. Coagulation Profile: No results for input(s): "INR", "PROTIME" in the last 168 hours. Cardiac Enzymes: No results for input(s): "CKTOTAL", "CKMB", "CKMBINDEX",  "TROPONINI" in the last 168 hours. BNP (last 3 results) No results for input(s): "PROBNP" in the last 8760 hours. HbA1C: No results for input(s): "HGBA1C" in the last 72 hours. CBG: Recent Labs  Lab 03/13/22 0721 03/13/22 1123 03/13/22 1649 03/13/22 2105 03/14/22 1235  GLUCAP 141* 140* 198* 288* 148*    Lipid Profile: No results for input(s): "CHOL", "HDL", "LDLCALC", "TRIG", "CHOLHDL", "LDLDIRECT" in the last 72 hours. Thyroid Function Tests: No results for input(s): "TSH", "T4TOTAL", "FREET4", "T3FREE", "THYROIDAB" in the last 72 hours. Anemia Panel: No results for input(s): "VITAMINB12", "FOLATE", "FERRITIN", "TIBC", "IRON", "RETICCTPCT" in the last 72 hours. Sepsis Labs: No results for input(s): "PROCALCITON", "LATICACIDVEN" in the last 168 hours.  No results found for this or any previous visit (from the past 240 hour(s)).       Radiology Studies: No results found.      Scheduled Meds:  (feeding supplement) PROSource Plus  30 mL Oral BID BM   alosetron  1 mg Oral BID   amitriptyline  50 mg Oral QHS   atorvastatin  40 mg Oral q1800   calcitRIOL  0.25 mcg Oral Daily   carvedilol  6.25 mg Oral BID   Chlorhexidine Gluconate Cloth  6 each  Topical Q0600   darbepoetin (ARANESP) injection - DIALYSIS  60 mcg Subcutaneous Q Fri-1800   docusate sodium  200 mg Oral BID   DULoxetine  60 mg Oral Daily   fentaNYL  1 patch Transdermal Q72H   furosemide  80 mg Oral Daily   hydrALAZINE  50 mg Oral TID   insulin aspart  0-9 Units Subcutaneous TID WC   insulin aspart  6 Units Subcutaneous TID WC   insulin glargine-yfgn  10 Units Subcutaneous QHS   methocarbamol  500 mg Oral QID   multivitamin  1 tablet Oral QHS   pantoprazole  40 mg Oral Daily   sevelamer carbonate  2,400 mg Oral TID WC   sodium zirconium cyclosilicate  10 g Oral Once per day on Sun Tue Thu Sat   topiramate  12.5 mg Oral Daily   Continuous Infusions:  albumin human       LOS: 200 days    Time  spent: 35 minutes    Nadya Hopwood A Willi Borowiak, MD [facility]   If 7PM-7AM, please contact night-coverage [email or web address]  03/14/2022, 3:01 PM

## 2022-03-14 NOTE — Progress Notes (Signed)
Mobility Specialist Progress Note:   03/14/22 1530  Mobility  Activity  (daily exercises + stretches)  Assistive Device None  Range of Motion/Exercises Active;Passive;Left leg  Activity Response Tolerated well  Mobility Referral Yes  $Mobility charge 1 Mobility   Pt eager for mobility stretches and daily stretches. Tolerated well. Pt left with all needs met.   Nelta Numbers Mobility Specialist Please contact via SecureChat or  Rehab office at [phone]

## 2022-03-14 NOTE — Progress Notes (Signed)
Patient to dialysis.

## 2022-03-14 NOTE — Progress Notes (Signed)
   03/14/22 1159  Vitals  Temp 97.8 F (36.6 C)  Temp Source Oral  BP (!) 127/49  MAP (mmHg) 72  BP Location Right Arm  BP Method Automatic  Patient Position (if appropriate) Lying  Pulse Rate 65  Pulse Rate Source Monitor  ECG Heart Rate 66  Resp 16  Oxygen Therapy  SpO2 100 %  O2 Device Room Air  Pulse Oximetry Type Continuous   Received patient in bed to unit.  Alert and oriented.  Informed consent signed and in chart.   Treatment initiated:    5520 Treatment completed: 1143  Patient tolerated well.  Transported back to the room  Alert, without acute distress.  Hand-off given to patient's nurse.   Access used: AVF Access issues: NA  Total UF removed: 3760ml Medication(s) given: Heparin 4000 unit bolus Post HD VS: see above Post HD weight: UTA d/t pt being in recliner and unable to stand to weigh   [facility]

## 2022-03-14 NOTE — TOC Progression Note (Signed)
Transition of Care [facility]) - Progression Note    Patient Details  Name: [name] MRN: [id number] Date of Birth: [birth year]  Transition of Care [facility]) CM/SW Contact  Tom-Johnson, Renea Ee, RN Phone Number: 03/14/2022, 2:39 PM  Clinical Narrative:     Patient called RN to speak with CM. Notified CM that she would like to hold off home health services next week as she and her daughter wants to make some changes to her home. CM informed patient that Home health agency will call to schedule date she wants them to start. [name] with [facility] notified.   CM called Medicaid Transportation to f/u on application and spoke with Varney Biles [phone]). Varney Biles to f/u with patient's SW to expedite transportation for patient's outpatient dialysis appointment on Wednesday 03/19/22.   CM received a call from Reola Calkins with [facility] stating that patient has been allocated PCS hours and a list of providers was emailed for patient to chose from. CM printed list and gave to patient, [name] chosen. CM called and spoke with Rob, acceptance voiced. Earnest Bailey will send referral to [facility].   Patient notified CM of wheelchair not being the right fit. States it is tight at the waist. CM notified Erasmo Downer with [employer]. Kristin and CM went together to patient's room to view the wheelchair. Patient's current wheelchair is a size 18, Kristin exchanged wheelchair to a size 20 and delivered to patient at bedside.  CM will continue to follow as patient progresses with care towards discharge.        Expected Discharge Plan: [place] Barriers to Discharge: Continued Medical Work up  Expected Discharge Plan and Services   Discharge Planning Services: CM Consult   Living arrangements for the past 2 months: [place] Determinants of Health (SDOH) Interventions [place]: No Food Insecurity  (12/30/2021)  Housing: Low Risk  (12/30/2021)  Transportation Needs: No Transportation Needs (12/30/2021)  Utilities: Not At Risk (12/30/2021)  Depression (PHQ2-9): Medium Risk (03/09/2020)  Tobacco Use: High Risk (09/20/2021)    Readmission Risk Interventions    07/26/2021    3:15 PM  Readmission Risk Prevention Plan  Transportation Screening Complete  PCP or Specialist Appt within 3-5 Days Complete  HRI or Home Care Consult Complete  Palliative Care Screening Not Applicable  Medication Review (RN Care Manager) Referral to Pharmacy

## 2022-03-14 NOTE — Progress Notes (Signed)
PT Cancellation Note  Patient Details Name: [name] MRN: [id number] DOB: 11-Oct-1965   Cancelled Treatment:    Reason Eval/Treat Not Completed: Patient declined.  Team had previously planned to have patient practice sliding board transfer from HD recliner back to wheelchair at end of dialysis session. Spoke with HD RN to confirm. Patient refused. Only willing to work on transfers in room with PT. Will follow-up as schedule allows   Candie Mile, PT, DPT Physical Therapist [facility] [place] [facility] 03/14/2022, 9:08 AM

## 2022-03-14 NOTE — Progress Notes (Signed)
Contacted [place] clinic manager to make her aware that pt received HD in chair today and tolerated it well so far. Will continue to provide updates as needed.   Melven Sartorius Renal Navigator [phone]

## 2022-03-14 NOTE — Progress Notes (Signed)
PT Cancellation Note  Patient Details Name: [name] MRN: [id number] DOB: 12/04/1965   Cancelled Treatment:    Reason Eval/Treat Not Completed: Patient declined, no reason specified Politely declines physical therapy today, post-dialysis, too fatigued.  [name] 03/14/2022, 2:50 PM

## 2022-03-14 NOTE — Progress Notes (Signed)
Pt transferred to chair and transported to HD via wheeling chair.Pt.tolerated well.

## 2022-03-14 NOTE — Progress Notes (Signed)
Pt. Asked if she can order breakfast yesterday for today's hemodialyses Pt. Stated she does not feel like eating breakfast.This morning this nurse asked if she changed her mind and order breakfast,pt.stated she will eat graham crackers which she has in her room but not ordering from cafeteria.

## 2022-03-15 DIAGNOSIS — M86252 Subacute osteomyelitis, left femur: Secondary | ICD-10-CM | POA: Diagnosis not present

## 2022-03-15 LAB — GLUCOSE, CAPILLARY: Glucose-Capillary: 292 mg/dL — ABNORMAL HIGH (ref 70–99)

## 2022-03-15 NOTE — Progress Notes (Signed)
PROGRESS NOTE    [name]  KYH:[id number] DOB: 03-17-1965 DOA: 08/26/2021 PCP: [name], MD   Brief Narrative: 57 year old with past medical history significant for hypertension, hyperlipidemia, diabetes type 2, ESRD on hemodialysis, status post left BKA for chronic calcaneus osteomyelitis on 01/12/2020, bipolar disorder, left distal fibular fracture after a fall on 07/19/2021 deemed nonoperative, admission from 08/03/2021 until 08/23/2021 for left femoral osteomyelitis requiring partial resection of femoral and rearrangement of bony fragments with subsequent antibiotics treatment presented to hospital with left hip bleeding and was found to have subacute osteomyelitis and abscess of the left femoral.  Orthopedic/ID were consulted and patient was a started on broad-spectrum antibiotics.  Patient underwent multiple I&D's by Dr. [name] along with local tissue rearrangement.  Patient was also seen by vascular surgery who did fistula branch ligation.  Patient has completed 6 weeks course of daptomycin on 80/90/2023.  She has been unwilling  to complete hemodialysis in chair and outpatient hemodialysis unit required she gets HD in chair prior to accept her. .  She is otherwise medically stable for discharge.     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  Active Problems:   ESRD (end stage renal disease) on dialysis ([place])   Type 2 diabetes mellitus with hyperlipidemia (HCC)   History of CVA (cerebrovascular accident)   Essential hypertension   Anxiety and depression   GASTROESOPHAGEAL REFLUX, NO ESOPHAGITIS   TOBACCO DEPENDENCE   Obesity (BMI 30.0-34.9)  1-Subacute osteomyelitis of the left femur with abscess Infected left hip hardware status post removal -Patient was a started on cefazolin.  Orthopedic surgery consulted.  ID consulted.  Patient underwent hardware removal on 6/10 prior to admission with I and D on 6/28 and 7/1. -Has completed  6 weeks of Daptomycin.  2-Left intertrochanteric fracture: This was previously managed with IM nail placement 2021. Hardware removed secondary to above  3-ESRD on hemodialysis: Nephrology following Patient's need to be able to sit on chair for hemodialysis to be able to set up outpatient hemodialysis. She was able to tolerate HD in chair.   Anasarca: Improved with hemodialysis.  Hyponatremia: Management with hemodialysis.  Hypokalemia, Hyperkalemia: Management with hemodialysis Anemia  of chronic disease, Acute blood loss anemia: In the setting of CKD in addition to blood loss from wound.  She has received a total of 2 units of packed red blood cell during this hospitalization  Diabetes Type 2;  Uncontrolled with hyperglycemia.  Recent A1c 7.1 Continue with Semglee and NovoLog   Migraines headache: Continue with Topamax  Hypertension: Continue with hydralazine and Coreg  Hyperlipidemia history of CVA: Continue with Lipitor  GERD: Continue with PPI Tobacco use: Encourage cessation History of left BKA Debility: Patient not always consistent with hemodialysis due to subjective reasons.  Patient also not very compliant with physical therapist.  Psych following for ACT therapy.  Depression:  Continue with amitriptyline and Cymbalta    Estimated body mass index is 29.2 kg/m as calculated from the following:   Height as of this encounter: 5\' 10"  (1.778 m).   Weight as of this encounter: 92.3 kg.   DVT prophylaxis: Heparin.  Code Status: Full code Family Communication: Care discussed with patient.  Disposition Plan:  Status is: Inpatient Remains inpatient appropriate because: plan to discharge patient on Tuesday. HD in chair on Monday     Consultants:  Orthopedic surgery Infectious disease Nephrology Vascular surgery Psychiatry  Procedures:  Vascular ultrasound duplex dialysis access  09/07/2021 Fistula revision branch ligation per Dr. Unk Lightning 09/19/2021- Left hip  debridement, application of wound VAC by Dr. [name] 08/28/2021    Antimicrobials:  Daptomycin Cefazolin  Subjective: No new complaints.   Objective: Vitals:   03/14/22 1218 03/14/22 1654 03/14/22 2127 03/15/22 0936  BP: (!) 131/49 (!) 124/53 (!) 115/47 (!) 133/46  Pulse: 68 66 70 67  Resp: 17 17 18 18   Temp: 98.6 F (37 C) 98.2 F (36.8 C) 98.1 F (36.7 C) 98.4 F (36.9 C)  TempSrc: Oral Oral Oral   SpO2: 99% 97% 94% 98%  Weight:      Height:        Intake/Output Summary (Last 24 hours) at 03/15/2022 1425 Last data filed at 03/15/2022 1014 Gross per 24 hour  Intake 300 ml  Output 200 ml  Net 100 ml    Filed Weights   03/10/22 0815 03/10/22 1310 03/12/22 2049  Weight: 100.9 kg 96.4 kg 92.3 kg    Examination:  General exam: NAD Respiratory system: CTA Cardiovascular system: S 1, S 2 RRR Gastrointestinal system: BS present, soft, nt Central nervous system: alert Extremities: Trace edema, Left BKA   Data Reviewed: I have personally reviewed following labs and imaging studies  CBC: Recent Labs  Lab 03/10/22 0838 03/14/22 0739  WBC 8.3 7.9  HGB 9.6* 10.6*  HCT 29.3* 32.3*  MCV 90.7 90.7  PLT 208 676    Basic Metabolic Panel: Recent Labs  Lab 03/10/22 0838 03/12/22 1605 03/14/22 0739  NA 128* 126* 131*  K 5.2* 5.0 4.8  CL 90* 88* 94*  CO2 24 25 24   GLUCOSE 182* 126* 127*  BUN 86* 69* 61*  CREATININE 5.93* 5.32* 4.99*  CALCIUM 8.7* 8.6* 9.0  PHOS 5.9*  --  6.1*    GFR: Estimated Creatinine Clearance: 15.9 mL/min (A) (by C-G formula based on SCr of 4.99 mg/dL (H)). Liver Function Tests: Recent Labs  Lab 03/10/22 0838 03/14/22 0739  ALBUMIN 3.5 3.7    No results for input(s): "LIPASE", "AMYLASE" in the last 168 hours. No results for input(s): "AMMONIA" in the last 168 hours. Coagulation Profile: No results for input(s): "INR", "PROTIME" in the last 168 hours. Cardiac Enzymes: No results for input(s): "CKTOTAL", "CKMB", "CKMBINDEX",  "TROPONINI" in the last 168 hours. BNP (last 3 results) No results for input(s): "PROBNP" in the last 8760 hours. HbA1C: No results for input(s): "HGBA1C" in the last 72 hours. CBG: Recent Labs  Lab 03/13/22 1649 03/13/22 2105 03/14/22 1235 03/14/22 1656 03/14/22 2127  GLUCAP 198* 288* 148* 209* 156*    Lipid Profile: No results for input(s): "CHOL", "HDL", "LDLCALC", "TRIG", "CHOLHDL", "LDLDIRECT" in the last 72 hours. Thyroid Function Tests: No results for input(s): "TSH", "T4TOTAL", "FREET4", "T3FREE", "THYROIDAB" in the last 72 hours. Anemia Panel: No results for input(s): "VITAMINB12", "FOLATE", "FERRITIN", "TIBC", "IRON", "RETICCTPCT" in the last 72 hours. Sepsis Labs: No results for input(s): "PROCALCITON", "LATICACIDVEN" in the last 168 hours.  No results found for this or any previous visit (from the past 240 hour(s)).       Radiology Studies: No results found.      Scheduled Meds:  (feeding supplement) PROSource Plus  30 mL Oral BID BM   alosetron  1 mg Oral BID   amitriptyline  50 mg Oral QHS   atorvastatin  40 mg Oral q1800   calcitRIOL  0.25 mcg Oral Daily   carvedilol  6.25 mg Oral BID   Chlorhexidine Gluconate Cloth  6 each Topical Q0600  darbepoetin (ARANESP) injection - DIALYSIS  60 mcg Subcutaneous Q Fri-1800   docusate sodium  200 mg Oral BID   DULoxetine  60 mg Oral Daily   fentaNYL  1 patch Transdermal Q72H   furosemide  80 mg Oral Daily   hydrALAZINE  50 mg Oral TID   insulin aspart  0-9 Units Subcutaneous TID WC   insulin aspart  6 Units Subcutaneous TID WC   insulin glargine-yfgn  10 Units Subcutaneous QHS   methocarbamol  500 mg Oral QID   multivitamin  1 tablet Oral QHS   pantoprazole  40 mg Oral Daily   sevelamer carbonate  2,400 mg Oral TID WC   sodium zirconium cyclosilicate  10 g Oral Once per day on Sun Tue Thu Sat   topiramate  12.5 mg Oral Daily   Continuous Infusions:  albumin human       LOS: 201 days    Time  spent: 35 minutes    Shanta Hartner A Margaux Engen, MD [facility]   If 7PM-7AM, please contact night-coverage [email or web address]  03/15/2022, 2:25 PM

## 2022-03-15 NOTE — Progress Notes (Signed)
The nephrology service will not see Ms. Rockefeller over the weekend. Will continue to review labs, place HD orders, and make appropriate changes if indicated. Please page me at [phone] for any questions or concerns and will certainly see if needed.   Tobie Poet, NP [facility]

## 2022-03-15 NOTE — Progress Notes (Signed)
Physical Therapy Treatment Patient Details Name: [name] MRN: [id number] DOB: 1965/07/16 Today's Date: 03/15/2022   History of Present Illness 57 y.o. female presented to ED 6/26 from dialysis with increased bloody drainage from L hip wound. 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 s/p hardware removal of left hip with partial resection of tuft tissue and femure that were nonviable on 08/10/21 Discharged home 6/23. underwent L hip debridement and wound vac placement 6/28; +Left intertrochanteric non-union,  Fracture of left distal femur  PMH: hypertension, hyperlipidemia, ESRD on HD MWF, history of left BKA in 2021, depression/anxiety, stroke, tobacco use, T2DM,  insomnia, chronic pain syndrome.    PT Comments    Daughter present for therapy session. Allowing patient to teach back understanding of set-up with transfers. Tolerated well. Daughter with questions, answered. Talked through mobility challenges. Patient able safely transfer with sliding board to w/c with daughter today under therapist supervision with minor cues for technique and alignment. Dtr placed sliding board and patient completed transfer into w/c without physical assistance once placed. Feels confident with current level of independence. Pt and daughter navigating independently in hallway, pt propelling w/c without assist. Patient will continue to benefit from skilled physical therapy services to further improve independence with functional mobility.    Recommendations for follow up therapy are one component of a multi-disciplinary discharge planning process, led by the attending physician.  Recommendations may be updated based on patient status, additional functional criteria and insurance authorization.  Follow Up Recommendations  Home health PT Can patient physically be transported by private vehicle: No   Assistance Recommended at Discharge Intermittent Supervision/Assistance  Patient  can return home with the following A little help with walking and/or transfers;Assistance with cooking/housework;Assist for transportation   Equipment Recommendations  Wheelchair (measurements PT);Wheelchair cushion (measurements PT) ( long sliding board, drop-arm BSC, van trasnport for HD, left elevating leg rest with calf pad.)    Recommendations for Other Services Other (comment) (Psychotherapy)     Precautions / Restrictions Precautions Precautions: Fall Precaution Comments: L BKA (baseline) Restrictions Weight Bearing Restrictions: No LLE Weight Bearing: Weight bearing as tolerated Other Position/Activity Restrictions: L distal femur fx; wearing new shrinker     Mobility  Bed Mobility Overal bed mobility: Needs Assistance Bed Mobility: Supine to Sit     Supine to sit: Min assist     General bed mobility comments: Min assist, teaching back technique with daughter. Pulling lightly through daughter's hand. VC from therapist for technique.    Transfers Overall transfer level: Needs assistance Equipment used: Sliding board Transfers: Bed to chair/wheelchair/BSC            Lateral/Scoot Transfers: Supervision, With slide board General transfer comment: Supervision for safety, daughter present, patient teaching daughter how to assist - she was able to adequately align w/c and sliding board. PT discussed safety and monitoring during transfer. Patient did not need any assist to transfer across sliding board into w/c from bed after board was placed.    Ambulation/Gait                   Theme park manager propulsion: Both upper extremities, Right lower extremity Wheelchair parts: Independent Wheelchair Assistance Details (indicate cue type and reason): Mobilizing in hallway independently. Daughter walking with pt.  Modified Rankin (Stroke Patients Only)       Balance Overall balance assessment: Needs  assistance Sitting-balance  support: Feet supported, No upper extremity supported Sitting balance-Leahy Scale: Good                                      Cognition Arousal/Alertness: Awake/alert Behavior During Therapy: WFL for tasks assessed/performed Overall Cognitive Status: Within Functional Limits for tasks assessed                                          Exercises      General Comments        Pertinent Vitals/Pain Pain Assessment Pain Assessment: No/denies pain Pain Intervention(s): Monitored during session, Limited activity within patient's tolerance    Home Living                          Prior Function            PT Goals (current goals can now be found in the care plan section) Acute Rehab PT Goals Patient Stated Goal: Would like to go home PT Goal Formulation: With patient Time For Goal Achievement: 03/18/22 Potential to Achieve Goals: Good Progress towards PT goals: Progressing toward goals    Frequency    Min 3X/week      PT Plan Current plan remains appropriate    Co-evaluation              AM-PAC PT "6 Clicks" Mobility   Outcome Measure  Help needed turning from your back to your side while in a flat bed without using bedrails?: None Help needed moving from lying on your back to sitting on the side of a flat bed without using bedrails?: A Little Help needed moving to and from a bed to a chair (including a wheelchair)?: A Little Help needed standing up from a chair using your arms (e.g., wheelchair or bedside chair)?: Total Help needed to walk in hospital room?: Total Help needed climbing 3-5 steps with a railing? : Total 6 Click Score: 13    End of Session Equipment Utilized During Treatment: Other (comment) (sliding board) Activity Tolerance: Patient tolerated treatment well Patient left: with call bell/phone within reach;in chair (w.c with daughter.) Nurse Communication: Mobility  status PT Visit Diagnosis: Other abnormalities of gait and mobility (R26.89);Muscle weakness (generalized) (M62.81);History of falling (Z91.81)     Time: 3220-2542 PT Time Calculation (min) (ACUTE ONLY): 20 min  Charges:  $Self Care/Home Management: [place], PT, DPT Physical Therapist Acute Rehabilitation Services [facility]    [name] 03/15/2022, 3:31 PM

## 2022-03-15 NOTE — Progress Notes (Signed)
Mobility Specialist Progress Note:   03/15/22 1500  Mobility  Activity  (daily stretches + exercises)  Assistive Device None  Range of Motion/Exercises Active;Left leg;Passive  Activity Response Tolerated well  Mobility Referral Yes  $Mobility charge 1 Mobility   Pt eager for daily stretches and exercises while sitting up in w/c. Encouraged pt to sit up as long as possible, pt voiced understanding. Pt tolerated session well. Left with all needs met.   Nelta Numbers Mobility Specialist Please contact via SecureChat or  Rehab office at [phone]

## 2022-03-16 DIAGNOSIS — M86252 Subacute osteomyelitis, left femur: Secondary | ICD-10-CM | POA: Diagnosis not present

## 2022-03-16 MED ORDER — ALTEPLASE 2 MG IJ SOLR: 2.0000 mg | Freq: Once | INTRAMUSCULAR | Status: DC | PRN

## 2022-03-16 MED ORDER — HEPARIN SODIUM (PORCINE) 1000 UNIT/ML DIALYSIS: 1000.0000 [IU] | INTRAMUSCULAR | Status: DC | PRN

## 2022-03-16 MED ORDER — PENTAFLUOROPROP-TETRAFLUOROETH EX AERO: 1.0000 | INHALATION_SPRAY | CUTANEOUS | Status: DC | PRN

## 2022-03-16 NOTE — Progress Notes (Signed)
Mobility Specialist Progress Note:   03/16/22 1330  Mobility  Activity  (daily stretches + exercises)  Assistive Device None  Range of Motion/Exercises Active;Passive;Right leg;Left leg  Activity Response Tolerated well  Mobility Referral Yes  $Mobility charge 1 Mobility   Session focused on education for pt and daughter on daily stretches and exercises. Tolerated session well. Pt left with all needs met.   Nelta Numbers Mobility Specialist Please contact via SecureChat or  Rehab office at [phone]

## 2022-03-16 NOTE — Progress Notes (Signed)
PROGRESS NOTE    [name]  FUX:[id number] DOB: 26-Dec-1965 DOA: 08/26/2021 PCP: [name], MD   Brief Narrative: 57 year old with past medical history significant for hypertension, hyperlipidemia, diabetes type 2, ESRD on hemodialysis, status post left BKA for chronic calcaneus osteomyelitis on 01/12/2020, bipolar disorder, left distal fibular fracture after a fall on 07/19/2021 deemed nonoperative, admission from 08/03/2021 until 08/23/2021 for left femoral osteomyelitis requiring partial resection of femoral and rearrangement of bony fragments with subsequent antibiotics treatment presented to hospital with left hip bleeding and was found to have subacute osteomyelitis and abscess of the left femoral.  Orthopedic/ID were consulted and patient was a started on broad-spectrum antibiotics.  Patient underwent multiple I&D's by Dr. [name] along with local tissue rearrangement.  Patient was also seen by vascular surgery who did fistula branch ligation.  Patient has completed 6 weeks course of daptomycin on 80/90/2023.  She has been unwilling  to complete hemodialysis in chair and outpatient hemodialysis unit required she gets HD in chair prior to accept her. .  She is otherwise medically stable for discharge.     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  Active Problems:   ESRD (end stage renal disease) on dialysis ([place])   Type 2 diabetes mellitus with hyperlipidemia (HCC)   History of CVA (cerebrovascular accident)   Essential hypertension   Anxiety and depression   GASTROESOPHAGEAL REFLUX, NO ESOPHAGITIS   TOBACCO DEPENDENCE   Obesity (BMI 30.0-34.9)  1-Subacute osteomyelitis of the left femur with abscess Infected left hip hardware status post removal -Patient was a started on cefazolin.  Orthopedic surgery consulted.  ID consulted.  Patient underwent hardware removal on 6/10 prior to admission with I and D on 6/28 and 7/1. -Has completed  6 weeks of Daptomycin.  2-Left intertrochanteric fracture: This was previously managed with IM nail placement 2021. Hardware removed secondary to above  3-ESRD on hemodialysis: Nephrology following Patient's need to be able to sit on chair for hemodialysis to be able to set up outpatient hemodialysis. She was able to tolerate HD in chair. Plan for HD again in chair on Monday then DC home Tuesday.   Anasarca: Improved with hemodialysis.  Hyponatremia: Management with hemodialysis.  Hypokalemia, Hyperkalemia: Management with hemodialysis Anemia  of chronic disease, Acute blood loss anemia: In the setting of CKD in addition to blood loss from wound.  She has received a total of 2 units of packed red blood cell during this hospitalization  Diabetes Type 2;  Uncontrolled with hyperglycemia.  Recent A1c 7.1 Continue with Semglee and NovoLog   Migraines headache: Continue with Topamax  Hypertension: Continue with hydralazine and Coreg  Hyperlipidemia history of CVA: Continue with Lipitor  GERD: Continue with PPI Tobacco use: Encourage cessation History of left BKA Debility: Patient not always consistent with hemodialysis due to subjective reasons.  Patient also not very compliant with physical therapist.  Psych following for ACT therapy.  Depression:  Continue with amitriptyline and Cymbalta    Estimated body mass index is 29.2 kg/m as calculated from the following:   Height as of this encounter: 5\' 10"  (1.778 m).   Weight as of this encounter: 92.3 kg.   DVT prophylaxis: Heparin.  Code Status: Full code Family Communication: Care discussed with patient.  Disposition Plan:  Status is: Inpatient Remains inpatient appropriate because: plan to discharge patient on Tuesday. HD in chair on Monday     Consultants:  Orthopedic surgery Infectious disease  Nephrology Vascular surgery Psychiatry  Procedures:  Vascular ultrasound duplex dialysis access 09/07/2021 Fistula  revision branch ligation per Dr. Unk Lightning 09/19/2021- Left hip debridement, application of wound VAC by Dr. [name] 08/28/2021    Antimicrobials:  Daptomycin Cefazolin  Subjective: No new complaints  Objective: Vitals:   03/15/22 0936 03/15/22 1645 03/15/22 2041 03/16/22 1026  BP: (!) 133/46 (!) 131/49 (!) 121/53 (!) 148/48  Pulse: 67 61 66 (!) 59  Resp: 18 17 18 17   Temp: 98.4 F (36.9 C) 98.4 F (36.9 C) 99.9 F (37.7 C) 98.3 F (36.8 C)  TempSrc:   Oral   SpO2: 98% 99% 97% 99%  Weight:      Height:        Intake/Output Summary (Last 24 hours) at 03/16/2022 1327 Last data filed at 03/16/2022 1000 Gross per 24 hour  Intake 600 ml  Output 400 ml  Net 200 ml    Filed Weights   03/10/22 0815 03/10/22 1310 03/12/22 2049  Weight: 100.9 kg 96.4 kg 92.3 kg    Examination:  General exam: NAD Respiratory system: CTA Cardiovascular system: S 1, S 2 RRR Gastrointestinal system: BS present, soft ,nt Central nervous system: Alert Extremities: Trace edema, Left BKA   Data Reviewed: I have personally reviewed following labs and imaging studies  CBC: Recent Labs  Lab 03/10/22 0838 03/14/22 0739  WBC 8.3 7.9  HGB 9.6* 10.6*  HCT 29.3* 32.3*  MCV 90.7 90.7  PLT 208 767    Basic Metabolic Panel: Recent Labs  Lab 03/10/22 0838 03/12/22 1605 03/14/22 0739  NA 128* 126* 131*  K 5.2* 5.0 4.8  CL 90* 88* 94*  CO2 24 25 24   GLUCOSE 182* 126* 127*  BUN 86* 69* 61*  CREATININE 5.93* 5.32* 4.99*  CALCIUM 8.7* 8.6* 9.0  PHOS 5.9*  --  6.1*    GFR: Estimated Creatinine Clearance: 15.9 mL/min (A) (by C-G formula based on SCr of 4.99 mg/dL (H)). Liver Function Tests: Recent Labs  Lab 03/10/22 0838 03/14/22 0739  ALBUMIN 3.5 3.7    No results for input(s): "LIPASE", "AMYLASE" in the last 168 hours. No results for input(s): "AMMONIA" in the last 168 hours. Coagulation Profile: No results for input(s): "INR", "PROTIME" in the last 168 hours. Cardiac Enzymes: No  results for input(s): "CKTOTAL", "CKMB", "CKMBINDEX", "TROPONINI" in the last 168 hours. BNP (last 3 results) No results for input(s): "PROBNP" in the last 8760 hours. HbA1C: No results for input(s): "HGBA1C" in the last 72 hours. CBG: Recent Labs  Lab 03/13/22 2105 03/14/22 1235 03/14/22 1656 03/14/22 2127 03/15/22 2044  GLUCAP 288* 148* 209* 156* 292*    Lipid Profile: No results for input(s): "CHOL", "HDL", "LDLCALC", "TRIG", "CHOLHDL", "LDLDIRECT" in the last 72 hours. Thyroid Function Tests: No results for input(s): "TSH", "T4TOTAL", "FREET4", "T3FREE", "THYROIDAB" in the last 72 hours. Anemia Panel: No results for input(s): "VITAMINB12", "FOLATE", "FERRITIN", "TIBC", "IRON", "RETICCTPCT" in the last 72 hours. Sepsis Labs: No results for input(s): "PROCALCITON", "LATICACIDVEN" in the last 168 hours.  No results found for this or any previous visit (from the past 240 hour(s)).       Radiology Studies: No results found.      Scheduled Meds:  (feeding supplement) PROSource Plus  30 mL Oral BID BM   alosetron  1 mg Oral BID   amitriptyline  50 mg Oral QHS   atorvastatin  40 mg Oral q1800   calcitRIOL  0.25 mcg Oral Daily   carvedilol  6.25 mg  Oral BID   Chlorhexidine Gluconate Cloth  6 each Topical Q0600   darbepoetin (ARANESP) injection - DIALYSIS  60 mcg Subcutaneous Q Fri-1800   docusate sodium  200 mg Oral BID   DULoxetine  60 mg Oral Daily   fentaNYL  1 patch Transdermal Q72H   furosemide  80 mg Oral Daily   hydrALAZINE  50 mg Oral TID   insulin aspart  0-9 Units Subcutaneous TID WC   insulin aspart  6 Units Subcutaneous TID WC   insulin glargine-yfgn  10 Units Subcutaneous QHS   methocarbamol  500 mg Oral QID   multivitamin  1 tablet Oral QHS   pantoprazole  40 mg Oral Daily   sevelamer carbonate  2,400 mg Oral TID WC   sodium zirconium cyclosilicate  10 g Oral Once per day on Sun Tue Thu Sat   topiramate  12.5 mg Oral Daily   Continuous  Infusions:  albumin human       LOS: 202 days    Time spent: 35 minutes    Chuong Casebeer A Gibran Veselka, MD [facility]   If 7PM-7AM, please contact night-coverage [email or web address]  03/16/2022, 1:27 PM

## 2022-03-16 NOTE — Progress Notes (Signed)
Physical Therapy Treatment Patient Details Name: [name] MRN: [id number] DOB: 1965-04-18 Today's Date: 03/16/2022   History of Present Illness 57 y.o. female presented to ED 6/26 from dialysis with increased bloody drainage from L hip wound. 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 s/p hardware removal of left hip with partial resection of tuft tissue and femure that were nonviable on 08/10/21 Discharged home 6/23. underwent L hip debridement and wound vac placement 6/28; +Left intertrochanteric non-union,  Fracture of left distal femur  PMH: hypertension, hyperlipidemia, ESRD on HD MWF, history of left BKA in 2021, depression/anxiety, stroke, tobacco use, T2DM,  insomnia, chronic pain syndrome.    PT Comments    Continuing work on functional mobility and activity tolerance;  Session focused on functional transfers, including practicing car transfers with car mock-up on 4th floor (mostly at daughter's request, but pt agreed to it); Performed lateral scoot transfers bed>WC, WC>car mock-up, car mock-up>WC, WC>bed; Pt's daughter assisted with sliding board placement with pt directing her (and cues from this PT for last transfer of session); Noteworthy smooth transfers when going towards the L; Less efficient scooting when going towards the R, and that lead to fatigue; Encouraged pt to take rest breaks when needed;   Once back in bed, educated pt's daughter on bolster placement to help with gentle stretching towards more neutral rotation positioning of L hip and more extension positioning of L knee;   Questions solicited and answered;   Discussed plan for tomorrow's PT session; This PT would like to get here early to assist [place] in transferring bed to wheelchair and then wheelchair to HD recliner to more approximate the transfers she must do to approximate home; she stated she may not go to HD in the recliner tomorrow (if she's worried about the possibility of  needing to move her bowels)   Recommendations for follow up therapy are one component of a multi-disciplinary discharge planning process, led by the attending physician.  Recommendations may be updated based on patient status, additional functional criteria and insurance authorization.  Follow Up Recommendations  Home health PT (HHOT, HHAide) Can patient physically be transported by private vehicle: No (perhaps soon)   Assistance Recommended at Discharge Intermittent Supervision/Assistance  Patient can return home with the following A little help with walking and/or transfers;Assistance with cooking/housework;Assist for transportation   Equipment Recommendations  Wheelchair (measurements PT);Wheelchair cushion (measurements PT) ( long sliding board, drop-arm BSC, van trasnport for HD, left elevating leg rest with calf pad.)    Recommendations for Other Services Other (comment) (Psychotherapy)     Precautions / Restrictions Precautions Precautions: Fall Precaution Comments: L BKA (baseline) Restrictions LLE Weight Bearing: Weight bearing as tolerated Other Position/Activity Restrictions: L distal femur fx; wearing new shrinker     Mobility  Bed Mobility Overal bed mobility: Needs Assistance Bed Mobility: Supine to Sit, Sit to Supine     Supine to sit: Min assist Sit to supine: Supervision   General bed mobility comments: Daughter gave handheld assist to pull to sit    Transfers Overall transfer level: Needs assistance Equipment used: Sliding board Transfers: Bed to chair/wheelchair/BSC            Lateral/Scoot Transfers: Min guard (without physical contact from PT) General transfer comment: Supervision for safety, daughter present, patient teaching daughter how to assist - she was able to adequately align w/c and sliding board. Pt's daughter shwoed some carryover from previous session, needed a few cues for angle of placement  and efficiency. Patient did not need any  assist to transfer across sliding board into w/c from bed (towards L side) after board was placed.; second set of transfers practiced at car mock-up on rehab unit; daughter helped with placement of board, and pt showed a smooth transfer to the left to get into the car; to transfer to the right out of the car, pt needed assist to stabilize her R foot on the floor, she completed the transfer with less efficiency, and needed a rest break and more time to move into optimal seated positioning    Ambulation/Gait                   Secretary/administrator Wheelchair mobility: Yes Wheelchair propulsion: Both upper extremities, Right lower extremity Wheelchair parts: Supervision/cueing (for Engineer, production) Wheelchair Assistance Details (indicate cue type and reason): Mobilizing in hallway independently. Daughter walking with pt.  Modified Rankin (Stroke Patients Only)       Balance                                            Cognition Arousal/Alertness: Awake/alert Behavior During Therapy: WFL for tasks assessed/performed Overall Cognitive Status: Within Functional Limits for tasks assessed                                          Exercises      General Comments General comments (skin integrity, edema, etc.): Pt and daughter showed sound reasoning with car transfer practice; initially wanting to try at small sUV height, but then daughter suggesting they use her sedan -- especially at first      Pertinent Vitals/Pain Pain Assessment Pain Assessment: Faces Faces Pain Scale: Hurts little more Pain Location: back Pain Descriptors / Indicators: Discomfort, Nagging Pain Intervention(s): Monitored during session    Home Living                          Prior Function            PT Goals (current goals can now be found in the care plan section) Acute Rehab PT Goals Patient Stated Goal:  Would like to go home PT Goal Formulation: With patient Time For Goal Achievement: 03/18/22 Potential to Achieve Goals: Good Progress towards PT goals: Progressing toward goals    Frequency    Min 3X/week      PT Plan Current plan remains appropriate    Co-evaluation              AM-PAC PT "6 Clicks" Mobility   Outcome Measure  Help needed turning from your back to your side while in a flat bed without using bedrails?: None Help needed moving from lying on your back to sitting on the side of a flat bed without using bedrails?: A Little Help needed moving to and from a bed to a chair (including a wheelchair)?: A Little Help needed standing up from a chair using your arms (e.g., wheelchair or bedside chair)?: Total Help needed to walk in hospital room?: Total Help needed climbing 3-5 steps with a railing? : Total 6 Click Score: 13    End of Session Equipment  Utilized During Treatment: Other (comment) (sliding board; adn mock up car) Activity Tolerance: Patient tolerated treatment well Patient left: in bed;with call bell/phone within reach Nurse Communication: Mobility status PT Visit Diagnosis: Other abnormalities of gait and mobility (R26.89);Muscle weakness (generalized) (M62.81);History of falling (Z91.81) Pain - part of body:  (back)     Time: 5732-2567 PT Time Calculation (min) (ACUTE ONLY): 47 min  Charges:  $Therapeutic Activity: 38-52 mins                     Roney Marion, PT  Acute Rehabilitation Services Office [phone]    [place] 03/16/2022, 5:22 PM

## 2022-03-16 NOTE — Plan of Care (Signed)
  Problem: Activity: Goal: Risk for activity intolerance will decrease Outcome: Progressing   Problem: Pain Managment: Goal: General experience of comfort will improve Outcome: Progressing   Problem: Health Behavior/Discharge Planning: Goal: Ability to manage health-related needs will improve Outcome: Progressing   Problem: Nutrition: Goal: Adequate nutrition will be maintained Outcome: Progressing   Problem: Elimination: Goal: Will not experience complications related to bowel motility Outcome: Progressing

## 2022-03-17 LAB — RENAL FUNCTION PANEL
Albumin: 3.5 g/dL (ref 3.5–5.0)
Anion gap: 13 (ref 5–15)
BUN: 97 mg/dL — ABNORMAL HIGH (ref 6–20)
CO2: 25 mmol/L (ref 22–32)
Calcium: 8.3 mg/dL — ABNORMAL LOW (ref 8.9–10.3)
Chloride: 91 mmol/L — ABNORMAL LOW (ref 98–111)
Creatinine, Ser: 6.54 mg/dL — ABNORMAL HIGH (ref 0.44–1.00)
GFR, Estimated: 7 mL/min — ABNORMAL LOW (ref >60)
Glucose, Bld: 168 mg/dL — ABNORMAL HIGH (ref 70–99)
Phosphorus: 6.7 mg/dL — ABNORMAL HIGH (ref 2.5–4.6)
Potassium: 4.6 mmol/L (ref 3.5–5.1)
Sodium: 129 mmol/L — ABNORMAL LOW (ref 135–145)

## 2022-03-17 LAB — DIFFERENTIAL
Abs Immature Granulocytes: 0.04 10*3/uL (ref 0.00–0.07)
Basophils Absolute: 0.1 10*3/uL (ref 0.0–0.1)
Basophils Relative: 1 %
Eosinophils Absolute: 0.2 10*3/uL (ref 0.0–0.5)
Eosinophils Relative: 3 %
Immature Granulocytes: 1 %
Lymphocytes Relative: 26 %
Lymphs Abs: 1.9 10*3/uL (ref 0.7–4.0)
Monocytes Absolute: 0.9 10*3/uL (ref 0.1–1.0)
Monocytes Relative: 13 %
Neutro Abs: 4.1 10*3/uL (ref 1.7–7.7)
Neutrophils Relative %: 56 %

## 2022-03-17 LAB — GLUCOSE, CAPILLARY
Glucose-Capillary: 173 mg/dL — ABNORMAL HIGH (ref 70–99)
Glucose-Capillary: 240 mg/dL — ABNORMAL HIGH (ref 70–99)
Glucose-Capillary: 105 mg/dL — ABNORMAL HIGH (ref 70–99)
Glucose-Capillary: 128 mg/dL — ABNORMAL HIGH (ref 70–99)
Glucose-Capillary: 186 mg/dL — ABNORMAL HIGH (ref 70–99)
Glucose-Capillary: 157 mg/dL — ABNORMAL HIGH (ref 70–99)
Glucose-Capillary: 150 mg/dL — ABNORMAL HIGH (ref 70–99)
Glucose-Capillary: 157 mg/dL — ABNORMAL HIGH (ref 70–99)
Glucose-Capillary: 169 mg/dL — ABNORMAL HIGH (ref 70–99)
Glucose-Capillary: 259 mg/dL — ABNORMAL HIGH (ref 70–99)
Glucose-Capillary: 145 mg/dL — ABNORMAL HIGH (ref 70–99)

## 2022-03-17 LAB — CBC
HCT: 31.1 % — ABNORMAL LOW (ref 36.0–46.0)
Hemoglobin: 10.4 g/dL — ABNORMAL LOW (ref 12.0–15.0)
MCH: 29.9 pg (ref 26.0–34.0)
MCHC: 33.4 g/dL (ref 30.0–36.0)
MCV: 89.4 fL (ref 80.0–100.0)
Platelets: 194 10*3/uL (ref 150–400)
RBC: 3.48 MIL/uL — ABNORMAL LOW (ref 3.87–5.11)
RDW: 14.5 % (ref 11.5–15.5)
WBC: 7.2 10*3/uL (ref 4.0–10.5)
nRBC: 0 % (ref 0.0–0.2)

## 2022-03-17 LAB — HEPATITIS B SURFACE ANTIGEN: Hepatitis B Surface Ag: NONREACTIVE

## 2022-03-17 MED ORDER — HEPARIN SODIUM (PORCINE) 1000 UNIT/ML IJ SOLN: 2000.0000 [IU] | Freq: Once | INTRAMUSCULAR | Status: DC

## 2022-03-17 MED ORDER — HEPARIN SODIUM (PORCINE) 1000 UNIT/ML IJ SOLN
4000.0000 [IU] | Freq: Once | INTRAMUSCULAR | Status: AC
  Administered 2022-03-17: 4000 [IU] via INTRAVENOUS
  Filled 2022-03-17: qty 4

## 2022-03-17 NOTE — Progress Notes (Addendum)
PT Cancellation Note  Patient Details Name: [name] MRN: [id number] DOB: Sep 30, 1965   Cancelled Treatment:    Reason Eval/Treat Not Completed: Other (comment)  Original plan was for PT to come in early and assist with transfer bed to [facility], and then WC to HD recliner; Pt taken to HD in the bed this morning; Tells me she will consider a PT session after HD, as long as she gets to eat her lunch;   Will follow up later today as time allows, and if [name] agrees;  Otherwise, will follow up for PT tomorrow;   Addendum: Followed up with [name] after lunch, and she politely declines PT;   Recommend L elevating legrest with calf pad to prevent further malpositioning of LLE; especially as she will be spending more time OOB in wheelchair at home;   Thank you,  Roney Marion, [place] [facility] [place] 03/17/2022, 12:07 PM

## 2022-03-17 NOTE — Progress Notes (Signed)
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  Orthopedic/ID were consulted and patient was a started on broad-spectrum antibiotics.  Patient underwent multiple I&D's by Dr. [name] along with local tissue rearrangement.  Patient was also seen by vascular surgery who did fistula branch ligation.  Patient has completed 6 weeks course of daptomycin on 80/90/2023.  She has been unwilling  to complete hemodialysis in chair and outpatient hemodialysis unit required she gets HD in chair prior to accept her. .  She is otherwise medically stable for discharge.     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  Active Problems:   ESRD (end stage renal disease) on dialysis ([place])   Type 2 diabetes mellitus with hyperlipidemia (HCC)   History of CVA (cerebrovascular accident)   Essential hypertension   Anxiety and depression   GASTROESOPHAGEAL REFLUX, NO ESOPHAGITIS   TOBACCO DEPENDENCE   Obesity (BMI 30.0-34.9)  1-Subacute osteomyelitis of the left femur with abscess Infected left hip hardware status post removal -Patient was a started on cefazolin.  Orthopedic surgery consulted.  ID consulted.  Patient underwent hardware removal on 6/10 prior to admission with I and D on 6/28 and 7/1. -Has completed  6 weeks of Daptomycin.  2-Left intertrochanteric fracture: This was previously managed with IM nail placement 2021. Hardware removed secondary to above  3-ESRD on hemodialysis: Nephrology following Patient's need to be able to sit on chair for hemodialysis to be able to set up outpatient hemodialysis. She was able to tolerate HD in chair on Friday.  Plan to discharge home tomorrow.  Had low BP in HD today. BP improved.   Anasarca: Improved with hemodialysis.  Hyponatremia: Management with hemodialysis.  Hypokalemia, Hyperkalemia: Management with hemodialysis Anemia  of chronic disease, Acute blood loss anemia: In the setting of CKD in addition to blood loss from wound.  She has received a total of 2 units of packed red blood cell during this hospitalization  Diabetes Type 2;  Uncontrolled with hyperglycemia.  Recent A1c 7.1 Continue with Semglee and NovoLog   Migraines headache: Continue with Topamax  Hypertension: Continue with hydralazine and Coreg  Hyperlipidemia history of CVA: Continue with Lipitor  GERD: Continue with PPI Tobacco use: Encourage cessation History of left BKA Debility: Patient not always consistent with hemodialysis due to subjective reasons.  Patient also not very compliant with physical therapist.  Psych following for ACT therapy.  Depression:  Continue with amitriptyline and Cymbalta    Estimated body mass index is 29.54 kg/m as calculated from the following:   Height as of this encounter: 5\' 10"  (1.778 m).   Weight as of this encounter: 93.4 kg.   DVT prophylaxis: Heparin.  Code Status: Full code Family Communication: Care discussed with patient.  Disposition Plan:  Status is: Inpatient Remains inpatient appropriate because: plan to discharge patient on Tuesday. HD in chair on Monday     Consultants:  Orthopedic surgery Infectious disease Nephrology Vascular surgery Psychiatry  Procedures:  Vascular ultrasound duplex dialysis  access 09/07/2021 Fistula revision branch ligation per Dr. Unk Lightning 09/19/2021- Left hip debridement, application of wound VAC by Dr. [name] 08/28/2021    Antimicrobials:  Daptomycin Cefazolin  Subjective: I saw patient in HD. She was feeling weak, her BP drop. BP got better.   Objective: Vitals:   03/17/22 1100 03/17/22 1115 03/17/22 1133 03/17/22 1227  BP: (!) 94/53 (!) 109/53 (!) 102/58 (!) 131/53  Pulse: (!) 56 (!) 59 (!) 59 63  Resp: 11 14 15 17   Temp:  98 F (36.7 C) 98 F (36.7 C) 98.4 F (36.9 C)  TempSrc:  Oral Oral   SpO2: 100% 96% 98% 99%  Weight:  93.4 kg    Height:        Intake/Output Summary (Last 24 hours) at 03/17/2022 1427 Last data filed at 03/17/2022 1359 Gross per 24 hour  Intake 0 ml  Output 4450 ml  Net -4450 ml    Filed Weights   03/17/22 0133 03/17/22 0719 03/17/22 1115  Weight: 93.4 kg 98.2 kg 93.4 kg    Examination:  General exam: NAD Respiratory system: CTA Cardiovascular system: S 1,  S RRR Gastrointestinal system: BS present, soft.  Central nervous system: Alert Extremities: Trace edema, Left BKA   Data Reviewed: I have personally reviewed following labs and imaging studies  CBC: Recent Labs  Lab 03/14/22 0739 03/17/22 0730  WBC 7.9 7.2  NEUTROABS  --  4.1  HGB 10.6* 10.4*  HCT 32.3* 31.1*  MCV 90.7 89.4  PLT 204 440    Basic Metabolic Panel: Recent Labs  Lab 03/12/22 1605 03/14/22 0739 03/17/22 0730  NA 126* 131* 129*  K 5.0 4.8 4.6  CL 88* 94* 91*  CO2 25 24 25   GLUCOSE 126* 127* 168*  BUN 69* 61* 97*  CREATININE 5.32* 4.99* 6.54*  CALCIUM 8.6* 9.0 8.3*  PHOS  --  6.1* 6.7*    GFR: Estimated Creatinine Clearance: 12.2 mL/min (A) (by C-G formula based on SCr of 6.54 mg/dL (H)). Liver Function Tests: Recent Labs  Lab 03/14/22 0739 03/17/22 0730  ALBUMIN 3.7 3.5    No results for input(s): "LIPASE", "AMYLASE" in the last 168 hours. No results for input(s): "AMMONIA" in the last 168 hours. Coagulation  Profile: No results for input(s): "INR", "PROTIME" in the last 168 hours. Cardiac Enzymes: No results for input(s): "CKTOTAL", "CKMB", "CKMBINDEX", "TROPONINI" in the last 168 hours. BNP (last 3 results) No results for input(s): "PROBNP" in the last 8760 hours. HbA1C: No results for input(s): "HGBA1C" in the last 72 hours. CBG: Recent Labs  Lab 03/16/22 1155 03/16/22 1647 03/16/22 2037 03/17/22 0700 03/17/22 1227  GLUCAP 186* 157* 150* 157* 169*    Lipid Profile: No results for input(s): "CHOL", "HDL", "LDLCALC", "TRIG", "CHOLHDL", "LDLDIRECT" in the last 72 hours. Thyroid Function Tests: No results for input(s): "TSH", "T4TOTAL", "FREET4", "T3FREE", "THYROIDAB" in the last 72 hours. Anemia Panel: No results for input(s): "VITAMINB12", "FOLATE", "FERRITIN", "TIBC", "IRON", "RETICCTPCT" in the last 72 hours. Sepsis Labs: No results for input(s): "PROCALCITON", "LATICACIDVEN" in the last 168 hours.  No results found for this or any previous visit (from the past 240 hour(s)).       Radiology Studies: No results found.      Scheduled Meds:  (feeding supplement) PROSource Plus  30 mL Oral BID BM   alosetron  1 mg Oral BID   amitriptyline  50 mg Oral  QHS   atorvastatin  40 mg Oral q1800   calcitRIOL  0.25 mcg Oral Daily   carvedilol  6.25 mg Oral BID   Chlorhexidine Gluconate Cloth  6 each Topical Q0600   darbepoetin (ARANESP) injection - DIALYSIS  60 mcg Subcutaneous Q Fri-1800   docusate sodium  200 mg Oral BID   DULoxetine  60 mg Oral Daily   fentaNYL  1 patch Transdermal Q72H   furosemide  80 mg Oral Daily   heparin sodium (porcine)  2,000 Units Intravenous Once   hydrALAZINE  50 mg Oral TID   insulin aspart  0-9 Units Subcutaneous TID WC   insulin aspart  6 Units Subcutaneous TID WC   insulin glargine-yfgn  10 Units Subcutaneous QHS   methocarbamol  500 mg Oral QID   multivitamin  1 tablet Oral QHS   pantoprazole  40 mg Oral Daily   sevelamer carbonate   2,400 mg Oral TID WC   sodium zirconium cyclosilicate  10 g Oral Once per day on Sun Tue Thu Sat   topiramate  12.5 mg Oral Daily   Continuous Infusions:  albumin human       LOS: 203 days    Time spent: 35 minutes    Ellijah Leffel A Magin Balbi, MD [facility]   If 7PM-7AM, please contact night-coverage [email or web address]  03/17/2022, 2:27 PM

## 2022-03-17 NOTE — Progress Notes (Signed)
Contacted [place] to confirm plans for pt to d/c to home tomorrow. Clinic advised pt was able to tolerate HD in the chair on Friday. Confirming pt's arrival time of 11:40 for 12:00 chair time as well. Will update renal PA as well.   Melven Sartorius Renal Navigator [phone]

## 2022-03-17 NOTE — Progress Notes (Signed)
Pt refused to come to HD in recliner that was ordered by PA.

## 2022-03-17 NOTE — Progress Notes (Signed)
Physical Therapy Note  Met with pt in HD and in room post-HD  S: Politely declines mobility and exercise; not feeling well after HD; a bit tearful -- excited about going home, and a little scared;  O: Pt provided education re: need for elevating legrests for best positioning when in wheelchair; Informed pt that this PT would follow-up with TOC; Pt voiced understanding;   Later on in the afternoon, met with [name] with [employer]; discussed WC/legrest needs; at this time, the amputee pad legrest for LLE will be optimal; as she gets better with her prosthesis wear, a full elevating legrest will be good; [name] was able to provide both;  A: [name] is at a pivotal time; the plan is to go home tomorrow at a wheelchair transfer functional level, and with her needs for a wheelchair taken care of, everything looks ready; [name] has consistently demonstrated sliding board transfers, and her daughter [name] has provided correct assist, and emphasizes that she will be there to help her mother; [name] can safely manage at wheelchair level at home with assist as needed from her daughter; Given the extraordinary circumstances of her long stay in the hospital acutely, it is understandable that she is becoming a bit emotional;   P: PT to check on [name] tomorrow early, and go over initial HEP; to go home by [employer] tomorrow; will be at wheelchair transfer functional level, and have daughter assist at home; HHPT to follow and progress mobility and exercises  [place] Office [phone]   AM: 9:40-9:50 PM: 1435-14:45    03/17/22 1500  PT General Charges  $$ ACUTE PT VISIT 1 Visit  PT Treatments  $Self Care/Home Management 8-22

## 2022-03-17 NOTE — Progress Notes (Signed)
[name] Progress Note   Subjective:    Seen in [place]. Tolerating UF. Declined recliner today d/t stomach upset but is ready for discharge. Dialyzed in recliner on Friday with no issues  Denies cp, dyspnea, nausea/vomiting, diarrhea.   Objective Vitals:   03/17/22 0719 03/17/22 0730 03/17/22 0800 03/17/22 0830  BP: (!) 154/62 (!) 150/57 (!) 150/60 137/66  Pulse: (!) 59 (!) 58 61 62  Resp: 13 13 11 11   Temp: 97.9 F (36.6 C)     TempSrc: Oral     SpO2: 99% 100% 99% 99%  Weight: 98.2 kg     Height:       Physical Exam General: [name] Heart: RRR, no MRG Lungs: CTA bilaterally, nonlabored Abdomen: Soft , +BS, NTND Extremities trace pitting edema bilateral lower extremities, including hips Dialysis Access: RUE AVF + bruit   Filed Weights   03/12/22 2049 03/17/22 0719  Weight: 92.3 kg 98.2 kg    Intake/Output Summary (Last 24 hours) at 03/17/2022 0843 Last data filed at 03/17/2022 7062 Gross per 24 hour  Intake 300 ml  Output 150 ml  Net 150 ml     Additional Objective Labs: Basic Metabolic Panel: Recent Labs  Lab 03/12/22 1605 03/14/22 0739  NA 126* 131*  K 5.0 4.8  CL 88* 94*  CO2 25 24  GLUCOSE 126* 127*  BUN 69* 61*  CREATININE 5.32* 4.99*  CALCIUM 8.6* 9.0  PHOS  --  6.1*    Liver Function Tests: Recent Labs  Lab 03/14/22 0739  ALBUMIN 3.7    No results for input(s): "LIPASE", "AMYLASE" in the last 168 hours. CBC: Recent Labs  Lab 03/14/22 0739 03/17/22 0730  WBC 7.9 7.2  NEUTROABS  --  4.1  HGB 10.6* 10.4*  HCT 32.3* 31.1*  MCV 90.7 89.4  PLT 204 194    Blood Culture    Component Value Date/Time   SDES TISSUE 08/28/2021 0917   SPECREQUEST LEFT THIGH TISSUE 08/28/2021 0917   CULT  08/28/2021 0917    MODERATE ENTEROCOCCUS FAECIUM VANCOMYCIN RESISTANT ENTEROCOCCUS ISOLATED NO ANAEROBES ISOLATED Sent to [place] for further susceptibility testing. Performed at [facility], [place] [address]  [place], [place] [address]    REPTSTATUS 09/02/2021 FINAL 08/28/2021 3151    Cardiac Enzymes: No results for input(s): "CKTOTAL", "CKMB", "CKMBINDEX", "TROPONINI" in the last 168 hours. CBG: Recent Labs  Lab 03/16/22 0731 03/16/22 1155 03/16/22 1647 03/16/22 2037 03/17/22 0700  GLUCAP 128* 186* 157* 150* 157*    Iron Studies: No results for input(s): "IRON", "TIBC", "TRANSFERRIN", "FERRITIN" in the last 72 hours. Lab Results  Component Value Date   INR 1.1 12/28/2019   INR 1.2 12/17/2018   INR 0.99 04/07/2017   Studies/Results: No results found.  Medications:  albumin human      (feeding supplement) PROSource Plus  30 mL Oral BID BM   alosetron  1 mg Oral BID   amitriptyline  50 mg Oral QHS   atorvastatin  40 mg Oral q1800   calcitRIOL  0.25 mcg Oral Daily   carvedilol  6.25 mg Oral BID   Chlorhexidine Gluconate Cloth  6 each Topical Q0600   darbepoetin (ARANESP) injection - DIALYSIS  60 mcg Subcutaneous Q Fri-1800   docusate sodium  200 mg Oral BID   DULoxetine  60 mg Oral Daily   fentaNYL  1 patch Transdermal Q72H   furosemide  80 mg Oral Daily   heparin sodium (porcine)  2,000 Units  Intravenous Once   hydrALAZINE  50 mg Oral TID   insulin aspart  0-9 Units Subcutaneous TID WC   insulin aspart  6 Units Subcutaneous TID WC   insulin glargine-yfgn  10 Units Subcutaneous QHS   methocarbamol  500 mg Oral QID   multivitamin  1 tablet Oral QHS   pantoprazole  40 mg Oral Daily   sevelamer carbonate  2,400 mg Oral TID WC   sodium zirconium cyclosilicate  10 g Oral Once per day on Sun Tue Thu Sat   topiramate  12.5 mg Oral Daily    Dialysis Orders: MWF at [facility], 400/500, EDW 90kg, 2K/2Ca, LUE AVF - No heparin prior to admission, no ESA  Assessment/Plan: 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 Completed 6 wk course of IV Daptomycin on 10/12/21. Per Dr. Jess Barters note 12/28/2021 "NO RESTRICTIONS L HIP".  Chronic L  hip/residual limb pain: Hx fractures of L femoral neck (where hardware was removed) as well as distal femur Fx. Pain control per primary.  ESRD: Usual MWF schedule. Previously AVF infiltrated but is working well as of now, is back on regular MWF schedule. Clots HD system without heparin, 4000 unit IV bolus and Heparin 2000 units IV mid-run bolus TIW.  Hyperkalemia: Stable on scheduled lokelma 4 days a week.  HTN/volume: Large fluids gains between HD which has been discussed extensively, Ongoing edema bilateral lower extremity  Continue to use max UF with HD-plan for UFG 4-L on HD.  Anemia of ESRD: Hb trending down. Last tsat 54% from Oct. . Aranesp 60 q Friday resumed- last 1/12.   Tsat 18% Ferritin 309 on 1/5/254 Hb 10.4  Secondary HPTH: CorrCa ok, no VDRA. Phos 5.9, continue Renvela as binder. Needs diet compliance. Nutrition: ALB 3.5, continue protein supplements.  Renal diet w/fluid restrictions.   AVF dysfunction (resolved): S/p branch ligation 09/19/2021  DM2: Insulin per primary.   Dispo:   Noted patient had multidisciplinary meeting 03/12/2022 in her room with 11 members of her care staff including Dr. Hulen Skains Chief medical Officer and patient's daughter [name]. Patient  thinks she can sit up in dialysis chair for 4 hours and tentative and to be discharged to home with home health services arranged and transportation to and from HD. Patient sitting in chair for today's HD and will continue this with HD on Monday 1/15. Plan for  discharge on 1/17.   Lynnda Child PA-C [facility] 03/17/2022,8:43 AM

## 2022-03-17 NOTE — TOC Progression Note (Signed)
Transition of Care [facility]) - Progression Note    Patient Details  Name: [name] MRN: [id number] Date of Birth: January 22, 1966  [facility] CM/SW Contact  Tom-Johnson, Renea Ee, RN Phone Number: 03/17/2022, 2:24 PM  Clinical Narrative:     CM notified by MD and PT that patient needs a Lt elevating legrest with a calf pad for her wheelchair. Kristin with [employer] notified and came to patient's room and exchanged foot rest.  CM will continue to follow.      Expected Discharge Plan: [place] Barriers to Discharge: Continued Medical Work up  Expected Discharge Plan and Services   Discharge Planning Services: CM Consult   Living arrangements for the past 2 months: [place] Determinants of Health (SDOH) Interventions [place]: No Food Insecurity (12/30/2021)  Housing: Low Risk  (12/30/2021)  Transportation Needs: No Transportation Needs (12/30/2021)  Utilities: Not At Risk (12/30/2021)  Depression (PHQ2-9): Medium Risk (03/09/2020)  Tobacco Use: High Risk (09/20/2021)    Readmission Risk Interventions    07/26/2021    3:15 PM  Readmission Risk Prevention Plan  Transportation Screening Complete  PCP or Specialist Appt within 3-5 Days Complete  HRI or Home Care Consult Complete  Palliative Care Screening Not Applicable  Medication Review (RN Care Manager) Referral to Pharmacy

## 2022-03-17 NOTE — Progress Notes (Signed)
   03/17/22 1133  Vitals  Temp 98 F (36.7 C)  Temp Source Oral  BP (!) 102/58  MAP (mmHg) 74  BP Location Right Arm  BP Method Automatic  Patient Position (if appropriate) Lying  Pulse Rate (!) 59  Pulse Rate Source Monitor  ECG Heart Rate 60  Resp 15  Oxygen Therapy  SpO2 98 %  O2 Device Room Air  Pulse Oximetry Type Continuous   Received patient in bed to unit.  Alert and oriented.  Informed consent signed and in chart.   Treatment initiated: 0731 Treatment completed: 1115  Patient tolerated well.  Transported back to the room  Alert, without acute distress.  Hand-off given to patient's nurse.   Access used: AVF Access issues: NA  Total UF removed: 4332ml Medication(s) given Heparin 4000 units bolus at beginning of tx Post HD VS:  see above Post HD weight: 93.4kg   [facility]

## 2022-03-18 ENCOUNTER — Other Ambulatory Visit (HOSPITAL_COMMUNITY): Payer: Self-pay

## 2022-03-18 ENCOUNTER — Encounter (HOSPITAL_COMMUNITY): Payer: Self-pay

## 2022-03-18 LAB — GLUCOSE, CAPILLARY
Glucose-Capillary: 127 mg/dL — ABNORMAL HIGH (ref 70–99)
Glucose-Capillary: 188 mg/dL — ABNORMAL HIGH (ref 70–99)

## 2022-03-18 MED ORDER — PROMETHAZINE HCL 12.5 MG PO TABS
12.5000 mg | ORAL_TABLET | Freq: Two times a day (BID) | ORAL | 0 refills | Status: AC | PRN
  Filled 2022-03-18: qty 30, 15d supply, fill #0

## 2022-03-18 MED ORDER — FENTANYL 12 MCG/HR TD PT72: 1.0000 | MEDICATED_PATCH | TRANSDERMAL | 0 refills | Status: AC

## 2022-03-18 MED ORDER — METHOCARBAMOL 500 MG PO TABS
500.0000 mg | ORAL_TABLET | Freq: Four times a day (QID) | ORAL | 0 refills | Status: AC
  Filled 2022-03-18: qty 30, 8d supply, fill #0

## 2022-03-18 MED ORDER — TOPIRAMATE 25 MG PO TABS
12.5000 mg | ORAL_TABLET | Freq: Every day | ORAL | 1 refills | Status: AC
  Filled 2022-03-18: qty 30, 60d supply, fill #0

## 2022-03-18 MED ORDER — AMITRIPTYLINE HCL 25 MG PO TABS
50.0000 mg | ORAL_TABLET | Freq: Every day | ORAL | 0 refills | Status: AC
  Filled 2022-03-18: qty 60, 30d supply, fill #0

## 2022-03-18 MED ORDER — DEXLANSOPRAZOLE 60 MG PO CPDR
60.0000 mg | DELAYED_RELEASE_CAPSULE | Freq: Every day | ORAL | 0 refills | Status: AC
  Filled 2022-03-18: qty 30, 30d supply, fill #0

## 2022-03-18 MED ORDER — ALOSETRON HCL 1 MG PO TABS: 1.0000 mg | ORAL_TABLET | Freq: Two times a day (BID) | ORAL | 0 refills | Status: AC

## 2022-03-18 MED ORDER — DULOXETINE HCL 60 MG PO CPEP
60.0000 mg | ORAL_CAPSULE | Freq: Every day | ORAL | 3 refills | Status: AC
  Filled 2022-03-18: qty 30, 30d supply, fill #0

## 2022-03-18 MED ORDER — ATORVASTATIN CALCIUM 40 MG PO TABS
40.0000 mg | ORAL_TABLET | Freq: Every day | ORAL | 1 refills | Status: AC
  Filled 2022-03-18: qty 30, 30d supply, fill #0

## 2022-03-18 MED ORDER — SEVELAMER CARBONATE 800 MG PO TABS
2400.0000 mg | ORAL_TABLET | Freq: Three times a day (TID) | ORAL | 0 refills | Status: AC
  Filled 2022-03-18: qty 60, 7d supply, fill #0

## 2022-03-18 MED ORDER — FUROSEMIDE 80 MG PO TABS
80.0000 mg | ORAL_TABLET | Freq: Every day | ORAL | 0 refills | Status: AC
  Filled 2022-03-18: qty 30, 30d supply, fill #0

## 2022-03-18 MED ORDER — OXYCODONE HCL 5 MG PO TABS
5.0000 mg | ORAL_TABLET | Freq: Three times a day (TID) | ORAL | 0 refills | Status: AC | PRN
  Filled 2022-03-18: qty 21, 7d supply, fill #0

## 2022-03-18 MED ORDER — ALOSETRON HCL 1 MG PO TABS
1.0000 mg | ORAL_TABLET | Freq: Two times a day (BID) | ORAL | 0 refills | Status: DC
  Filled 2022-03-18: qty 60, 30d supply, fill #0

## 2022-03-18 MED ORDER — NICOTINE 21 MG/24HR TD PT24: 21.0000 mg | MEDICATED_PATCH | Freq: Every day | TRANSDERMAL | 0 refills | Status: AC | PRN

## 2022-03-18 MED ORDER — NALOXONE HCL 4 MG/0.1ML NA LIQD: 1.0000 | NASAL | 0 refills | Status: AC | PRN

## 2022-03-18 MED ORDER — NALOXONE HCL 4 MG/0.1ML NA LIQD
1.0000 | NASAL | 0 refills | Status: DC | PRN
  Filled 2022-03-18: qty 30, fill #0

## 2022-03-18 MED ORDER — INSULIN ASPART 100 UNIT/ML IJ SOLN: 6.0000 [IU] | Freq: Three times a day (TID) | INTRAMUSCULAR | 11 refills | Status: AC

## 2022-03-18 MED ORDER — CARVEDILOL 6.25 MG PO TABS
6.2500 mg | ORAL_TABLET | Freq: Two times a day (BID) | ORAL | 0 refills | Status: AC
  Filled 2022-03-18: qty 60, 30d supply, fill #0

## 2022-03-18 MED ORDER — FENTANYL 12 MCG/HR TD PT72
1.0000 | MEDICATED_PATCH | TRANSDERMAL | 0 refills | Status: DC
  Filled 2022-03-18: qty 5, 15d supply, fill #0

## 2022-03-18 MED ORDER — HYDRALAZINE HCL 50 MG PO TABS
50.0000 mg | ORAL_TABLET | Freq: Three times a day (TID) | ORAL | 2 refills | Status: AC
  Filled 2022-03-18: qty 90, 30d supply, fill #0

## 2022-03-18 NOTE — Discharge Summary (Addendum)
Physician Discharge Summary   Patient: [name] MRN: [id number] DOB: [birth year]  Admit date:     08/26/2021  Discharge date: 03/18/22  Discharge Physician: Elmarie Shiley   PCP: [name], MD   Recommendations at discharge:    Continue with [facility] PT>  Continue with HD, three times weekly.   Discharge Diagnoses: 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  Active Problems:   ESRD (end stage renal disease) on dialysis ([place])   Type 2 diabetes mellitus with hyperlipidemia (HCC)   History of CVA (cerebrovascular accident)   Essential hypertension   Anxiety and depression   GASTROESOPHAGEAL REFLUX, NO ESOPHAGITIS   TOBACCO DEPENDENCE   Obesity (BMI 30.0-34.9)  Resolved Problems:   * No resolved hospital problems. 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 Orthopedic/ID were consulted and patient was a started on broad-spectrum antibiotics. Patient underwent multiple I&D's by Dr. [name] along with local tissue rearrangement. Patient was also seen by vascular surgery who did fistula branch ligation. Patient has completed 6 weeks course of daptomycin on 80/90/2023. She has been unwilling to complete hemodialysis in chair and outpatient hemodialysis unit required she gets HD in chair prior to accept her. . She is otherwise medically stable for discharge.   Assessment and Plan: 1-Subacute  osteomyelitis of the left femur with abscess Infected left hip hardware status post removal -Patient was a started on cefazolin.  Orthopedic surgery consulted.  ID consulted.  Patient underwent hardware removal on 6/10 prior to admission with I and D on 6/28 and 7/1. -Has completed 6 weeks of Daptomycin.   2-Left intertrochanteric fracture: This was previously managed with IM nail placement 2021. Hardware removed secondary to above   3-ESRD on hemodialysis: Nephrology following Patient's need to be able to sit on chair for hemodialysis to be able to set up outpatient hemodialysis. She was able to tolerate HD in chair on Friday.  Plan to discharge home tomorrow.  Had low BP in HD. Resolved.    Anasarca: Improved with hemodialysis.   Hyponatremia: Management with hemodialysis.   Hypokalemia, Hyperkalemia: Management with hemodialysis Anemia  of chronic disease, Acute blood loss anemia: In the setting of CKD in addition to blood loss from wound.  She has received a total of 2 units of packed red blood cell during this hospitalization   Diabetes Type 2;  Uncontrolled with hyperglycemia.  Recent A1c 7.1 Continue with Semglee and NovoLog     Migraines headache: Continue with Topamax   Hypertension: Continue with hydralazine and Coreg   Hyperlipidemia history of CVA: Continue with Lipitor   GERD: Continue with PPI Tobacco use: Encourage cessation History of left BKA Debility: Patient not always consistent with hemodialysis due to subjective reasons.  Patient also not very compliant with physical therapist.  Psych following for ACT therapy.   Depression:  Continue with amitriptyline and Cymbalta    stable for discharge.       Consultants: Nephrology, Ortho , ID, Vascular.  Procedures performed:Vascular ultrasound duplex dialysis access 09/07/2021 Fistula revision  branch ligation per Dr. Unk Lightning 09/19/2021- Left hip debridement, application of wound VAC by Dr. [name] 08/28/2021    Disposition: Home Diet recommendation:  Discharge Diet Orders (From admission, onward)     Start     Ordered   03/18/22 0000  Diet - low sodium heart healthy        03/18/22 0953           Carb modified diet DISCHARGE MEDICATION: Allergies as of 03/18/2022       Reactions   Trazodone Swelling   Gabapentin Itching   Lyrica [pregabalin] Itching   Latex Rash   Lidocaine Itching        Medication List     STOP taking these medications    amLODipine 5 MG tablet Commonly known as: NORVASC   Biotin 10 MG Caps   ceFAZolin  IVPB Commonly known as: ANCEF   Cinnamon 500 MG capsule   cyclobenzaprine 10 MG tablet Commonly known as: FLEXERIL   DIALYVITE 800 WITH ZINC 0.8 MG Tabs   diphenoxylate-atropine 2.5-0.025 MG tablet Commonly known as: LOMOTIL   insulin glargine-yfgn 100 UNIT/ML injection Commonly known as: SEMGLEE   ondansetron 4 MG tablet Commonly known as: ZOFRAN   oxyCODONE-acetaminophen 7.5-325 MG tablet Commonly known as: PERCOCET   polyethylene glycol 17 g packet Commonly known as: MIRALAX / GLYCOLAX       TAKE these medications    acetaminophen 325 MG tablet Commonly known as: TYLENOL Take 2 tablets (650 mg total) by mouth every 6 (six) hours as needed for mild pain (or Fever >/= 101).   alosetron 1 MG tablet Commonly known as: LOTRONEX Take 1 tablet (1 mg total) by mouth 2 (two) times daily.   amitriptyline 50 MG tablet Commonly known as: ELAVIL Take 1 tablet (50 mg total) by mouth at bedtime. What changed:  medication strength how much to take   atorvastatin 40 MG tablet Commonly known as: LIPITOR Take 1 tablet (40 mg total) by mouth daily at 6 PM.   carvedilol 6.25 MG tablet Commonly known as: COREG Take 1 tablet (6.25 mg total) by mouth 2 (two) times daily.   dexlansoprazole 60 MG capsule Commonly known as: Dexilant Take 1 capsule (60 mg total) by mouth daily.   docusate sodium 100 MG capsule Commonly known as:  COLACE Take 1 capsule (100 mg total) by mouth 2 (two) times daily.   DULoxetine 60 MG capsule Commonly known as: CYMBALTA Take 1 capsule (60 mg total) by mouth daily.   fentaNYL 12 MCG/HR Commonly known as: [place] 1 patch onto the skin every 3 (three) days. Start taking on: March 19, 2022   furosemide 80 MG tablet Commonly known as: LASIX Take 1 tablet (80 mg total) by mouth daily.   hydrALAZINE 50 MG tablet Commonly known as: APRESOLINE Take 1 tablet (50 mg total) by mouth 3 (three) times daily.   insulin aspart 100 UNIT/ML injection Commonly known as: novoLOG Inject 6 Units into the skin 3 (three) times daily with meals. What changed:  how much to take Another medication with the same name was removed. Continue taking this medication, and follow the directions you see here.   Lantus SoloStar 100 UNIT/ML Solostar Pen Generic drug: insulin glargine Inject 10 Units into the skin at bedtime.   lidocaine-prilocaine cream Commonly known as: EMLA APPLY SMALL AMOUNT TO ACCESS SITE (AVF) 1 TO 2 HOURS BEFORE DIALYSIS. COVER WITH OCCLUSIVE DRESSING (SARAN WRAP)   methocarbamol 500 MG tablet Commonly known as: ROBAXIN  Take 1 tablet (500 mg total) by mouth 4 (four) times daily.   Muscle Rub 10-15 % Crea Apply 1 application topically as needed for muscle pain.   naloxone 4 MG/0.1ML Liqd nasal spray kit Commonly known as: NARCAN Place 1 spray into the nose as needed (accidental overdose).   nicotine 21 mg/24hr patch Commonly known as: NICODERM CQ - dosed in mg/24 hours Place 1 patch (21 mg total) onto the skin daily as needed (nicotine craving).   oxyCODONE 5 MG immediate release tablet Commonly known as: Oxy IR/ROXICODONE Take 1 tablet (5 mg total) by mouth every 8 (eight) hours as needed for moderate pain.   promethazine 12.5 MG tablet Commonly known as: PHENERGAN Take 1 tablet (12.5 mg total) by mouth 2 (two) times daily as needed for vomiting or nausea.    sevelamer carbonate 800 MG tablet Commonly known as: RENVELA Take 3 tablets (2,400 mg total) by mouth 3 (three) times daily with meals. What changed: how much to take   topiramate 25 MG tablet Commonly known as: TOPAMAX Take 0.5 tablets (12.5 mg total) by mouth daily.               Durable Medical Equipment  (From admission, onward)           Start     Ordered   03/14/22 1504  For home use only DME high strength lightweight manual wheelchair with seat cushion  Once       Comments: Patient suffers from debility, amputation.  which impairs their ability to perform daily activities like dressing in the home.  A crutch will not resolve  issue with performing activities of daily living. A wheelchair will allow patient to safely perform daily activities.Length of need Lifetime. (THEN ONE OF THESE TWO:) Patient requires a size  which is not available in a standard or lightweight wheelchair and patient spends at least two hours per day in their chair. Accessories: elevating leg rests (ELRs), wheel locks, extensions and anti-tippers.   03/14/22 1504              Discharge Care Instructions  (From admission, onward)           Start     Ordered   03/18/22 0000  Discharge wound care:       Comments: See above   03/18/22 9470            Follow-up Information     Newt Minion, MD Follow up in 1 week(s).   Specialty: Orthopedic Surgery Contact information: [place] [address] [phone]         [facility] - Follow up in 4 week(s).   Specialty: Vascular Surgery Why: Office will call to arrange your appt(s) (sent) Contact information: [place] [address] [phone]               Discharge Exam: Danley Danker Weights   03/17/22 0133 03/17/22 0719 03/17/22 1115  Weight: 93.4 kg 98.2 kg 93.4 kg   General; NAD  Condition at discharge: stable  The results of significant  diagnostics from this hospitalization (including imaging, microbiology, ancillary and laboratory) are listed below for reference.   Imaging Studies: No results found.  Microbiology: Results for orders placed or performed during the hospital encounter of 08/26/21  Surgical pcr screen     Status: Abnormal   Collection Time: 08/28/21  5:09 AM   Specimen: Nasal Mucosa; Nasal Swab  Result Value Ref Range  Status   MRSA, PCR NEGATIVE NEGATIVE Final   Staphylococcus aureus POSITIVE (A) NEGATIVE Final    Comment: (NOTE) The Xpert SA Assay (FDA approved for NASAL specimens in patients 65 years of age and older), is one component of a comprehensive surveillance program. It is not intended to diagnose infection nor to guide or monitor treatment. Performed at [facility], [place] [address] [place], [place] [address]   Aerobic/Anaerobic Culture w Gram Stain (surgical/deep wound)     Status: None   Collection Time: 08/28/21  9:17 AM   Specimen: Soft Tissue, Other  Result Value Ref Range Status   Specimen Description TISSUE  Final   Special Requests LEFT THIGH TISSUE  Final   Gram Stain   Final    RARE WBC PRESENT,BOTH PMN AND MONONUCLEAR NO ORGANISMS SEEN    Culture   Final    MODERATE ENTEROCOCCUS FAECIUM VANCOMYCIN RESISTANT ENTEROCOCCUS ISOLATED NO ANAEROBES ISOLATED Sent to [place] for further susceptibility testing. Performed at [facility], [place] [address], [place], [place] [address]    Report Status 09/02/2021 FINAL  Final   Organism ID, Bacteria ENTEROCOCCUS FAECIUM  Final      Susceptibility   Enterococcus faecium - MIC*    AMPICILLIN >=32 RESISTANT Resistant     VANCOMYCIN >=32 RESISTANT Resistant     GENTAMICIN SYNERGY SENSITIVE Sensitive     LINEZOLID 2 SENSITIVE Sensitive     * MODERATE ENTEROCOCCUS FAECIUM    Labs: CBC: Recent Labs  Lab 03/14/22 0739 03/17/22 0730  WBC 7.9 7.2  NEUTROABS  --  4.1  HGB 10.6* 10.4*  HCT 32.3* 31.1*  MCV 90.7 89.4   PLT 204 056   Basic Metabolic Panel: Recent Labs  Lab 03/12/22 1605 03/14/22 0739 03/17/22 0730  NA 126* 131* 129*  K 5.0 4.8 4.6  CL 88* 94* 91*  CO2 25 24 25   GLUCOSE 126* 127* 168*  BUN 69* 61* 97*  CREATININE 5.32* 4.99* 6.54*  CALCIUM 8.6* 9.0 8.3*  PHOS  --  6.1* 6.7*   Liver Function Tests: Recent Labs  Lab 03/14/22 0739 03/17/22 0730  ALBUMIN 3.7 3.5   CBG: Recent Labs  Lab 03/17/22 0700 03/17/22 1227 03/17/22 1636 03/17/22 2024 03/18/22 0721  GLUCAP 157* 169* 259* 145* 127*    Discharge time spent: greater than 30 minutes.  Signed: Elmarie Shiley, MD [facility] 03/18/2022

## 2022-03-18 NOTE — Progress Notes (Signed)
[facility] Progress Note   Subjective:    Seen and examined in room. Completed dialysis yesterday -net UF 4.3L  She's going home today!!!   Objective Vitals:   03/17/22 1227 03/17/22 1636 03/17/22 2026 03/18/22 0814  BP: (!) 131/53 (!) 146/55 (!) 139/50 (!) 157/55  Pulse: 63 68 70 66  Resp: 17 18 18 17   Temp: 98.4 F (36.9 C) 98.5 F (36.9 C) 98.4 F (36.9 C) 98 F (36.7 C)  TempSrc:  Oral Oral Oral  SpO2: 99% 97% 97% 100%  Weight:      Height:       Physical Exam General: [name] Heart: RRR, no MRG Lungs: CTA bilaterally, nonlabored Abdomen: Soft , +BS, NTND Extremities trace pitting edema bilateral lower extremities, including hips Dialysis Access: RUE AVF + bruit   Filed Weights   03/17/22 0133 03/17/22 0719 03/17/22 1115  Weight: 93.4 kg 98.2 kg 93.4 kg    Intake/Output Summary (Last 24 hours) at 03/18/2022 1031 Last data filed at 03/18/2022 0900 Gross per 24 hour  Intake 540 ml  Output 4301 ml  Net -3761 ml     Additional Objective Labs: Basic Metabolic Panel: Recent Labs  Lab 03/12/22 1605 03/14/22 0739 03/17/22 0730  NA 126* 131* 129*  K 5.0 4.8 4.6  CL 88* 94* 91*  CO2 25 24 25   GLUCOSE 126* 127* 168*  BUN 69* 61* 97*  CREATININE 5.32* 4.99* 6.54*  CALCIUM 8.6* 9.0 8.3*  PHOS  --  6.1* 6.7*    Liver Function Tests: Recent Labs  Lab 03/14/22 0739 03/17/22 0730  ALBUMIN 3.7 3.5    No results for input(s): "LIPASE", "AMYLASE" in the last 168 hours. 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 Performed at [facility], [place] [address] [place], [place] [id number]    REPTSTATUS 09/02/2021 FINAL 08/28/2021 3016    Cardiac Enzymes: No results for input(s): "CKTOTAL", "CKMB", "CKMBINDEX", "TROPONINI" in the last 168 hours. CBG: Recent Labs  Lab 03/17/22 0700 03/17/22 1227 03/17/22 1636 03/17/22 2024 03/18/22 0721  GLUCAP 157* 169* 259* 145* 127*    Iron Studies: No results for input(s): "IRON", "TIBC", "TRANSFERRIN", "FERRITIN" in the last 72 hours. Lab Results  Component Value Date   INR 1.1 12/28/2019   INR 1.2 12/17/2018   INR 0.99 04/07/2017   Studies/Results: No results found.  Medications:  albumin human      (feeding supplement) PROSource Plus  30 mL Oral BID BM   alosetron  1 mg Oral BID   amitriptyline  50 mg Oral QHS   atorvastatin  40 mg Oral q1800   calcitRIOL  0.25 mcg Oral Daily   carvedilol  6.25 mg Oral BID   Chlorhexidine Gluconate Cloth  6 each Topical Q0600   darbepoetin (ARANESP) injection - DIALYSIS  60 mcg Subcutaneous Q Fri-1800   docusate sodium  200 mg Oral BID   DULoxetine  60 mg Oral Daily   fentaNYL  1 patch Transdermal Q72H   furosemide  80  mg Oral Daily   heparin sodium (porcine)  2,000 Units Intravenous Once   hydrALAZINE  50 mg Oral TID   insulin aspart  0-9 Units Subcutaneous TID WC   insulin aspart  6 Units Subcutaneous TID WC   insulin glargine-yfgn  10 Units Subcutaneous QHS   methocarbamol  500 mg Oral QID   multivitamin  1 tablet Oral QHS   pantoprazole  40 mg Oral Daily   sevelamer carbonate  2,400 mg Oral TID WC   sodium zirconium cyclosilicate  10 g Oral Once per day on Sun Tue Thu Sat   topiramate  12.5 mg Oral Daily    Dialysis Orders: MWF at [facility], 400/500, EDW 90kg, 2K/2Ca, LUE AVF - No heparin prior to admission, no ESA  Assessment/Plan: 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 Completed 6 wk course of IV Daptomycin on 10/12/21. Per  Dr. Jess Barters note 12/28/2021 "NO RESTRICTIONS L HIP".  Chronic L hip/residual limb pain: Hx fractures of L femoral neck (where hardware was removed) as well as distal femur Fx. Pain control per primary.  ESRD: Usual MWF schedule. Previously AVF infiltrated but is working well as of now, is back on regular MWF schedule. Clots HD system without heparin, 4000 unit IV bolus and Heparin 2000 units IV mid-run bolus TIW.  Hyperkalemia: Stable on scheduled lokelma 4 days a week.  HTN/volume: Large fluids gains between HD which has been discussed extensively, Ongoing edema bilateral lower extremity  Continue to use max UF with HD-plan for UFG 4-L on HD.  Anemia of ESRD: Hb trending down. Last tsat 54% from Oct. . Aranesp 60 q Friday resumed- last 1/12.   Tsat 18% Ferritin 309 on 1/5/254 Hb 10.4  Secondary HPTH: CorrCa ok, no VDRA. Phos 5.9, continue Renvela as binder. Needs diet compliance. Nutrition: ALB 3.5, continue protein supplements.  Renal diet w/fluid restrictions.   AVF dysfunction (resolved): S/p branch ligation 09/19/2021  DM2: Insulin per primary.   Dispo:   Noted patient had multidisciplinary meeting 03/12/2022 in her room with 11 members of her care staff including Dr. Hulen Skains Chief medical Officer and patient's daughter [name]. Patient  thinks she can sit up in dialysis chair for 4 hours and tentative and to be discharged to home with home health services arranged and transportation to and from HD. Patient sitting in chair for today's HD and will continue this with HD on Monday 1/15. Plan for  discharge on 1/17.   Lynnda Child PA-C [facility] 03/18/2022,10:31 AM

## 2022-03-18 NOTE — Progress Notes (Signed)
DISCHARGE NOTE HOME [name] to be discharged Home per MD order. Discussed prescriptions and follow up appointments with the patient. Prescriptions given to patient; medication list explained in detail. Patient verbalized understanding.  Skin clean, dry and intact without evidence of skin break down, no evidence of skin tears noted. IV catheter discontinued intact. Site without signs and symptoms of complications. Dressing and pressure applied. Pt denies pain at the site currently. No complaints noted.  Patient free of lines, drains, and wounds.   An After Visit Summary (AVS) was printed and given to the patient. Patient transported home by [facility], and discharged home via private auto.  Arlyss Repress, RN

## 2022-03-18 NOTE — Progress Notes (Signed)
Pt to d/c to home today. Contacted [facility] clinic manager to make her aware that pt will d/c today and resume care tomorrow. Case manager and CSW made transportation arrangements with [facility] for tomorrow. [facility] contacted clinic to work out a time that they can transport pt to HD tomorrow. Per [facility], pt will have an 8:00 am chair time tomorrow. [facility] states that future appt times will be determined based on clinic and transportation collaboration. Pt was made aware of this info by RN CM. HD appt time for tomorrow placed on pt's AVS. Communicated with clinic charge RN who confirms pt is on the schedule for tomorrow at 8:00 am and that info provided by [facility] is correct. Renal PA to send orders to clinic for tomorrow's treatment.   Melven Sartorius Renal Navigator [phone]

## 2022-03-18 NOTE — TOC Transition Note (Signed)
Transition of Care [facility]) - CM/SW Discharge Note   Patient Details  Name: [name] MRN: [id number] Date of Birth: [birth year]  Transition of Care [facility] CM/SW Contact:  Tom-Johnson, Renea Ee, RN Phone Number: 03/18/2022, 4:27 PM   Clinical Narrative:     Patient is scheduled for discharge today.  Home health with [facility] info on AVS.  PCS with [facility] info on AVS, patient to call and schedule for opening services.  Transportation to and from dialysis scheduled with [facility].  All information explained to patient with understanding verbalized and all questions answered.  [facility] scheduled for transportation. Prescriptions sent to [facility] and will be delivered to patient at bedside.  No further TOC needs noted.       Final next level of care: Home w Home Health Services Barriers to Discharge: Barriers Resolved   Patient Goals and CMS Choice CMS Medicare.gov Compare Post Acute Care list provided to:: Patient Choice offered to / list presented to : Patient  Discharge Placement                  Patient to be transferred to facility by: [place]      Discharge Plan and Services Additional resources added to the After Visit Summary for     Discharge Planning Services: CM Consult                      HH Arranged: PCS/Personal Care Services [facility] Agency: [place] Date [place]: 03/17/22 Time HH Agency Contacted: 1400 Representative spoke with at [place]: [place] Determinants of Health ([place]) Interventions [place]: No Food Insecurity (12/30/2021)  Housing: Low Risk  (12/30/2021)  Transportation Needs: No Transportation Needs (12/30/2021)  Utilities: Not At Risk (12/30/2021)  Depression (PHQ2-9): Medium Risk (03/09/2020)  Tobacco Use: High Risk (09/20/2021)     Readmission Risk Interventions    07/26/2021    3:15 PM  Readmission Risk Prevention Plan  Transportation Screening Complete  PCP or  Specialist Appt within 3-5 Days Complete  HRI or [place] Complete  Palliative Care Screening Not Applicable  Medication Review (RN Care Manager) Referral to Pharmacy

## 2022-03-18 NOTE — Consult Note (Signed)
[facility] New Face-to-Face Psychiatric Evaluation   Service Date: March 18, 2022 LOS:  LOS: 204 days    Assessment  [name] is a 57 y.o. female admitted medically for 08/26/2021 12:09 PM for subacute osteomyelitis and abscess of L femur. She carries the psychiatric diagnoses of anxiety and depression and has a past medical history of  ESRD, anemia of CKD, L hip fracture s/p IM nail placement. Psychiatry was consulted for psychological assessment by Dr. Lonny Prude. Notably she has been in the hospital for almost 6 months. The main barrier to discharge is pt's inability to sit in a chair.    Her current presentation of loss of hope and chronic difficulty coping with hospitalization is most consistent with demoralization syndrome; she also meets criteria for previously diagnosed mild to moderate depression.  Current outpatient psychotropic medications include elavil and duloxetine and historically she has had a fair to good response to these medications. Notably, duloxetine was stopped about a week before last consult which possibly explains some of the worsening anxiety and tearfulness. She was put briefly on mirtazapine, but eventually medication regimen consolidated to duloxetine at 60 mg dosing. She was compliant with medications prior to consult as evidenced by 6 month MAR.   On initial examination, patient presented as initially hostile, but warmed up throughout eval and eventually engaged in brief supportive psychotherapy. On repeat eval, she was engaged throughout the session and responded well to ACT techniques (see below). Fortunately we will not be able to continue this as she is discharging HOME later today, which has been her stated goal for the past several months. I spent approximately 15 minutes with this pt and/or coordinating care today.   I think she would benefit from ongoing therapy while inpt. Please see plan below for detailed recommendations.    Diagnoses:  Active Hospital problems: 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  Active Problems:   TOBACCO DEPENDENCE   GASTROESOPHAGEAL REFLUX, NO ESOPHAGITIS   Anxiety and depression   Obesity (BMI 30.0-34.9)   ESRD (end stage renal disease) on dialysis [facility])   Essential hypertension   Type 2 diabetes mellitus with hyperlipidemia ([place])   History of CVA (cerebrovascular accident)     Plan  ## Safety and Observation Level:  - Based on my clinical evaluation, I estimate the patient to be at low risk of self harm in the current setting - At this time, we recommend a routine level of observation. This decision is based on my review of the chart including patient's history and current presentation, interview of the patient, mental status examination, and consideration of suicide risk including evaluating suicidal ideation, plan, intent, suicidal or self-harm behaviors, risk factors, and protective factors. This judgment is based on our ability to directly address suicide risk, implement suicide prevention strategies and develop a safety plan while the patient is in the clinical setting. Please contact our team if there is a concern that risk level has changed.   ## Medications:  -- c elavil 50 mg  -- c duloxetine 60 mg  -- stoped xanax previously per pt request - prev on mirtazapine, stopped by primary, no indication to restart    ## Medical Decision Making Capacity:  Not formally assessed  ## Further Work-up:  --  last TSH wnl - vit D low although renal pt    -- most recent EKG in  07/2021 had QtC of <500 -- Pertinent labwork reviewed earlier this admission includes:  B12 wnl this admission  ## Disposition:  -- per primary -- pt declines psychotherapy resources at dc -- can f/u with PCP for meds (minimal changes through hospital course)  ## Behavioral / Environmental:  -- Staff should set consistent boundaries and expectations with  patient -- Utilize compassion and acknowledge the patient's experiences while setting clear and realistic expectations for care.   ##Legal Status -- vol   Thank you for this consult request. Recommendations have been communicated to the primary team.  We will sign off at this time. Should d/c be cancelled, please reconsult.   [place] A Genavie Boettger   New history   Relevant Aspects of Hospital Course:  Admitted on 08/26/2021 for IV abx in setting of osteomyelitis. Has had long hospital course only briefly summarized here. Most recently the issue has been pt's inability or unwillingness to sit in dialysis chair (which is a prerequisite to outpt dialysis). Has been able to tolerate ~2/4 hours while sitting. This is complicated by pt's stated goal of dc home (which frankly will be difficult to acheive physical decline over last 6 months). She has given many reasons she is unable to sit for dialysis, but the biggest stated barrier has been pain.   Patient Report:  Pt seen in AM. She is "excited and nervous" about discharge home; mostly afraid that she will fall (has had 2 falls resulting in fractures). Overall feels she is about as ready as she will ever be, and hopeful she will continue to gain strength outside the hospital. Lists many things she is looking forward too - mostly her own bed. Denies any serious mood concerns, most prominent sx of depression currently is poor sleep (which she expects to improve when dc). No SI, HI, AH/VH - appetite similarly poor but lists many foods she is excited to eat at home; plans to celebrate with filet. Provided brief supportive psychotherapy (active listening, validation) but given dc today did not continue with ACT.    Depression: She does still enjoy seeing her daughter, but is otherwise anhedonic. She sleeps OK on amitriptyline unless woken up by nursing staff. No guilt, worthlesness. Energy is OK for circumstances. Appetite is generally decreased esp w/  nausea. She is mildly restless. She denies suicidality. Would reach out if this changes.  Brief screens for mania, psychosis (-).   ROS:  Pain, fatigue  Collateral information:  From chart review  Psychiatric History:  Information collected from pt On many antidepressants, none really worked. All thorugh FM never seen psych or therapy  No hx suicidality No inpt psych admissions  Family psych history: unknown   Social History:  Essentially lives in hospital 1 daughter, 19 Has stepgrandchildren; close with them but divorced from that husband Socially isolated  Tobacco use: prior to hospital   Family History:   The patient's family history includes Diabetes in her father, mother, and sister; Heart attack in her father and mother; Heart disease in her father and mother; Hyperlipidemia in her mother; Hypertension in her mother and sister.  Medical History: Past Medical History:  Diagnosis Date   Anxiety 2002   Chest tightness    Chronic kidney disease    Depression 2001   Diabetes type 1, uncontrolled    at age 69   Diabetic neuropathy ([place])    Essential hypertension 2015   GERD (gastroesophageal reflux disease)    about age of 46   Headache    Nausea and vomiting in adult    Stroke [facility])  Urinary frequency    Yeast vaginitis     Surgical History: Past Surgical History:  Procedure Laterality Date   A/V FISTULAGRAM Left 08/22/2021   Procedure: A/V Fistulagram;  Surgeon: Cherre Robins, MD;  Location: [place] [facility];  Service: Cardiovascular;  Laterality: Left;   ACHILLES TENDON SURGERY Left 03/10/2013   Procedure: LEFT CHOPART AMPUTATION/ LEFT TENDON ACHILLES [place];  Surgeon: Wylene Simmer, MD;  Location: [place];  Service: Orthopedics;  Laterality: Left;   AMPUTATION Left 06/15/2012   Procedure: AMPUTATION DIGIT;  Surgeon: Meredith Pel, MD;  Location: [place];  Service: Orthopedics;  Laterality: Left;  Left great toe revision amputation   AMPUTATION  Left 01/12/2020   Procedure: AMPUTATION BELOW KNEE;  Surgeon: Wylene Simmer, MD;  Location: [place];  Service: Orthopedics;  Laterality: Left;   APPLICATION OF WOUND VAC  08/28/2021   Procedure: APPLICATION OF WOUND VAC;  Surgeon: Newt Minion, MD;  Location: [place];  Service: Orthopedics;;   AV FISTULA PLACEMENT Left 01/09/2020   Procedure: LEFT UPPER ARM ARTERIOVENOUS (AV) FISTULA CREATION;  Surgeon: Cherre Robins, MD;  Location: [place];  Service: Vascular;  Laterality: Left;   ENDOMETRIAL ABLATION     HARDWARE REMOVAL Left 08/10/2021   Procedure: LEFT HIP REMOVAL NAIL;  Surgeon: Newt Minion, MD;  Location: [place];  Service: Orthopedics;  Laterality: Left;   I & D EXTREMITY Left 01/19/2020   Procedure: IRRIGATION AND DEBRIDEMENT Left Hip;  Surgeon: Rod Can, MD;  Location: [place];  Service: Orthopedics;  Laterality: Left;   I & D EXTREMITY Left 08/28/2021   Procedure: LEFT HIP DEBRIDEMENT;  Surgeon: Newt Minion, MD;  Location: [place];  Service: Orthopedics;  Laterality: Left;   I & D EXTREMITY Left 08/31/2021   Procedure: REPEAT DEBRIDEMENT LEFT HIP;  Surgeon: Newt Minion, MD;  Location: [place];  Service: Orthopedics;  Laterality: Left;   INSERTION OF DIALYSIS CATHETER Right 01/09/2020   Procedure: INSERTION OF DIALYSIS CATHETER;  Surgeon: Cherre Robins, MD;  Location: [place];  Service: Vascular;  Laterality: Right;   INTRAMEDULLARY (IM) NAIL INTERTROCHANTERIC Left 12/29/2019   Procedure: INTRAMEDULLARY (IM) NAIL INTERTROCHANTRIC;  Surgeon: Rod Can, MD;  Location: [facility];  Service: Orthopedics;  Laterality: Left;   IR ANGIOGRAM EXTREMITY LEFT  12/17/2018   IR FEM POP ART ATHERECT INC PTA MOD SED  12/17/2018   IR RADIOLOGIST EVAL & MGMT  11/30/2018   IR RADIOLOGIST EVAL & MGMT  12/09/2018   IR RADIOLOGIST EVAL & MGMT  02/08/2019   IR US GUIDE VASC ACCESS RIGHT  12/17/2018   LAPAROSCOPIC CHOLECYSTECTOMY  01/30/2015   [facility]    REVISON OF ARTERIOVENOUS FISTULA Left  09/19/2021   Procedure: LIGATION OF LEFT ARM ARTERIOVENOUS FISTULA;  Surgeon: Broadus John, MD;  Location: [facility];  Service: Vascular;  Laterality: Left;    Medications:   Current Facility-Administered Medications:    (feeding supplement) PROSource Plus liquid 30 mL, 30 mL, Oral, BID BM, Stovall, Kathryn R, PA-C, 30 mL at 03/17/22 1337   acetaminophen (TYLENOL) tablet 325-650 mg, 325-650 mg, Oral, Q6H PRN, Newt Minion, MD, 650 mg at 02/23/22 1011   albumin human 25 % solution 12.5 g, 12.5 g, Intravenous, PRN, Penninger, Ria Comment, PA   alosetron (LOTRONEX) tablet 1 mg, 1 mg, Oral, BID, Pokhrel, Laxman, MD, 1 mg at 03/17/22 2104   amitriptyline (ELAVIL) tablet 50 mg, 50 mg, Oral, QHS, Pokhrel, Laxman, MD, 50 mg at 03/17/22  2103   atorvastatin (LIPITOR) tablet 40 mg, 40 mg, Oral, q1800, Manuella Ghazi, Pratik D, DO, 40 mg at 03/17/22 1732   bisacodyl (DULCOLAX) suppository 10 mg, 10 mg, Rectal, Daily PRN, Newt Minion, MD   calcitRIOL (ROCALTROL) capsule 0.25 mcg, 0.25 mcg, Oral, Daily, Cyndia Skeeters, Taye T, MD, 0.25 mcg at 03/17/22 1337   calcium carbonate (TUMS - dosed in mg elemental calcium) chewable tablet 200 mg of elemental calcium, 1 tablet, Oral, Q2H PRN, Vernelle Emerald, MD, 200 mg of elemental calcium at 02/20/22 2105   camphor-menthol (SARNA) lotion, , Topical, PRN, Donne Hazel, MD   carvedilol (COREG) tablet 6.25 mg, 6.25 mg, Oral, BID, Cyndia Skeeters, Taye T, MD, 6.25 mg at 03/17/22 2103   Chlorhexidine Gluconate Cloth 2 % PADS 6 each, 6 each, Topical, Q0600, Collins, Samantha G, PA-C   Darbepoetin Alfa (ARANESP) injection 60 mcg, 60 mcg, Subcutaneous, Q Fri-1800, Collins, Samantha G, PA-C, 60 mcg at 03/14/22 1733   docusate sodium (COLACE) capsule 200 mg, 200 mg, Oral, BID, Berle Mull M, MD, 200 mg at 03/16/22 2325   DULoxetine (CYMBALTA) DR capsule 60 mg, 60 mg, Oral, Daily, Mariel Aloe, MD, 60 mg at 03/17/22 1337   fentaNYL (DURAGESIC) 12 MCG/HR 1 patch, 1 patch, Transdermal, Q72H,  Pham, Minh Q, RPH-CPP, 1 patch at 03/16/22 1807   furosemide (LASIX) tablet 80 mg, 80 mg, Oral, Daily, Reesa Chew, MD, 80 mg at 03/17/22 1337   heparin sodium (porcine) injection 2,000 Units, 2,000 Units, Intravenous, Once, Tobie Poet E, NP   hydrALAZINE (APRESOLINE) injection 5 mg, 5 mg, Intravenous, Q8H PRN, Hosie Poisson, MD   hydrALAZINE (APRESOLINE) tablet 50 mg, 50 mg, Oral, TID, Verlon Au, Jai-Gurmukh, MD, 50 mg at 03/17/22 2103   insulin aspart (novoLOG) injection 0-9 Units, 0-9 Units, Subcutaneous, TID WC, Gonfa, Taye T, MD, 1 Units at 03/18/22 0853   insulin aspart (novoLOG) injection 6 Units, 6 Units, Subcutaneous, TID WC, [place] Eric J, DO, 6 Units at 03/17/22 1732   insulin glargine-yfgn (SEMGLEE) injection 10 Units, 10 Units, Subcutaneous, QHS, Alekh, Kshitiz, MD, 10 Units at 03/17/22 2106   methocarbamol (ROBAXIN) tablet 500 mg, 500 mg, Oral, QID, 500 mg at 03/17/22 2104 **OR** [DISCONTINUED] methocarbamol (ROBAXIN) 500 mg in dextrose 5 % 50 mL IVPB, 500 mg, Intravenous, Q6H PRN, Newt Minion, MD   multivitamin (RENA-VIT) tablet 1 tablet, 1 tablet, Oral, QHS, Newt Minion, MD, 1 tablet at 03/17/22 2104   ondansetron (ZOFRAN) tablet 4 mg, 4 mg, Oral, Q4H PRN, Rai, Ripudeep K, MD, 4 mg at 02/26/22 1744   Oral care mouth rinse, 15 mL, Mouth Rinse, PRN, Dessa Phi, DO   oxyCODONE (Oxy IR/ROXICODONE) immediate release tablet 5 mg, 5 mg, Oral, Q8H PRN, Arrien, Jimmy Picket, MD, 5 mg at 03/17/22 1732   pantoprazole (PROTONIX) EC tablet 40 mg, 40 mg, Oral, Daily, Arrien, Jimmy Picket, MD, 40 mg at 03/17/22 1337   polyethylene glycol (MIRALAX / GLYCOLAX) packet 17 g, 17 g, Oral, BID PRN, Cyndia Skeeters, Taye T, MD   promethazine (PHENERGAN) tablet 12.5 mg, 12.5 mg, Oral, Q6H PRN, Hosie Poisson, MD, 12.5 mg at 03/05/22 1114   senna-docusate (Senokot-S) tablet 1 tablet, 1 tablet, Oral, QHS PRN, Amin, Ankit Chirag, MD   sevelamer carbonate (RENVELA) tablet 2,400 mg, 2,400 mg,  Oral, TID WC, Collins, Samantha G, PA-C, 2,400 mg at 03/18/22 0853   sodium zirconium cyclosilicate (LOKELMA) packet 10 g, 10 g, Oral, Once per day on Sun Tue Thu Sat, Samtani, Jai-Gurmukh,  MD, 10 g at 03/16/22 1053   topiramate (TOPAMAX) tablet 12.5 mg, 12.5 mg, Oral, Daily, Mariel Aloe, MD, 12.5 mg at 03/17/22 1338  Allergies: Allergies  Allergen Reactions   Trazodone Swelling   Gabapentin Itching   Lyrica [Pregabalin] Itching   Latex Rash   Lidocaine Itching       Objective  Vital signs:  Temp:  [98 F (36.7 C)-98.5 F (36.9 C)] 98 F (36.7 C) (01/16 0814) Pulse Rate:  [55-70] 66 (01/16 0814) Resp:  [8-18] 17 (01/16 0814) BP: (66-157)/(44-59) 157/55 (01/16 0814) SpO2:  [96 %-100 %] 100 % (01/16 0814) Weight:  [93.4 kg] 93.4 kg (01/15 1115)  Psychiatric Specialty Exam:  Presentation  General Appearance: Appropriate for Environment; Fairly Groomed  Eye Contact:Good  Speech:Clear and Coherent  Speech Volume:Normal  Handedness:No data recorded  Mood and Affect  Mood:-- (Excited, Nervous)  Affect:Congruent; Full Range   Thought Process  Thought Processes:Coherent; Linear; Goal Directed  Descriptions of Associations:Intact  Orientation:Full (Time, Place and Person)  Thought Content:Logical (Less rumination than prior)  History of Schizophrenia/Schizoaffective disorder:No data recorded Duration of Psychotic Symptoms:No data recorded Hallucinations:Hallucinations: None    Ideas of Reference:None  Suicidal Thoughts:Suicidal Thoughts: No    Homicidal Thoughts:Homicidal Thoughts: No     Sensorium  Memory:Immediate Good; Recent Good; Remote Good  Judgment:Fair  Insight:Fair   Executive Functions  Concentration:Good  Attention Span:Good  [place] of Knowledge:Good  Language:Good   Psychomotor Activity  Psychomotor Activity:Psychomotor Activity: Normal     Assets  Assets:Social Support; Housing   Sleep   Sleep:Sleep: Fair      Physical Exam: Physical Exam HENT:     Head: Normocephalic.  Pulmonary:     Effort: Pulmonary effort is normal.  Skin:    Comments: No rash on visible skin  Neurological:     Mental Status: She is alert.    Blood pressure (!) 157/55, pulse 66, temperature 98 F (36.7 C), temperature source Oral, resp. rate 17, height 5\' 10"  (1.778 m), weight 93.4 kg, SpO2 100 %. Body mass index is 29.54 kg/m.

## 2022-03-18 NOTE — Progress Notes (Signed)
Physical Therapy Treatment Patient Details Name: [name] MRN: [id number] DOB: [birth year] Today's Date: 03/18/2022   History of Present Illness 57 y.o. female presented to ED 6/26 from dialysis with increased bloody drainage from L hip wound. 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 s/p hardware removal of left hip with partial resection of tuft tissue and femure that were nonviable on 08/10/21 Discharged home 6/23. underwent L hip debridement and wound vac placement 6/28; +Left intertrochanteric non-union,  Fracture of left distal femur  PMH: hypertension, hyperlipidemia, ESRD on HD MWF, history of left BKA in 2021, depression/anxiety, stroke, tobacco use, T2DM,  insomnia, chronic pain syndrome.    PT Comments    Going home today!  Met with [name] to go over dc plan and provide her with her initial, simple HEP; Wheelchair push ups, and bolstered bridging, as well as the stretches Mobility Specialist has been doing with her; Provided with written handout with pics and info for the [place] app, which has videos;   Questions solicited and answered; Provided encouragement -- she has managed at a wheelchair functional level before, and can do it again   Recommendations for follow up therapy are one component of a multi-disciplinary discharge planning process, led by the attending physician.  Recommendations may be updated based on patient status, additional functional criteria and insurance authorization.  Follow Up Recommendations  Home health PT (HHOT, [facility]) Can patient physically be transported by private vehicle:  (soon, have practiced car transfer with car mock-up; will defer practice with daughter's car to [place])   Assistance Recommended at Discharge Intermittent Supervision/Assistance  Patient can return home with the following A little help with walking and/or transfers;Assistance with cooking/housework;Assist for transportation   Equipment  Recommendations  Wheelchair (measurements PT);Wheelchair cushion (measurements PT) (bil drop-arm BSC, van trasnport for HD, left elevating leg rest with calf pad.)    Recommendations for Other Services Other (comment) (Psychotherapy)     Precautions / Restrictions Precautions Precautions: Fall Precaution Comments: L BKA (baseline) Restrictions LLE Weight Bearing: Weight bearing as tolerated Other Position/Activity Restrictions: L distal femur fx; wearing new shrinker     Mobility  Bed Mobility                    Transfers                        Ambulation/Gait                   Geneticist, molecular Details (indicate cue type and reason): Discussed purpose and use of L amputee pad legrest, including how to swing away and take off/put on; [name] verbalized understanding; Also talked about switching out amputee pad legrest for regular elevating legrest when she uses her prosthesis more regularly  Modified Rankin (Stroke Patients Only)       Balance                                            Cognition Arousal/Alertness: Awake/alert Behavior During Therapy: WFL for tasks assessed/performed Overall Cognitive Status: Within Functional Limits for tasks assessed  General Comments: Seems more at peace and excited about getting home        Exercises Other Exercises Other Exercises: bridges with L residual limb bolstered; demonstrated for pt, and provided written handout with pics; [name] verbalized understanding Other Exercises: Wheelchair pushups; demonstrated, provided handout with pictures; [name] verbalized understanding Other Exercises: Reviewed stretches that Mob Specialist, [name], taught pt and daughter; [name] verbalized understanding    General Comments General comments (skin integrity, edema, etc.):  Discussed plan for DC today; She talked about how she has managed at wheelchair level before; While this is different, she is hopeful      Pertinent Vitals/Pain Pain Assessment Pain Assessment: Faces Faces Pain Scale: No hurt Pain Intervention(s): Monitored during session    Home Living                          Prior Function            PT Goals (current goals can now be found in the care plan section) Acute Rehab PT Goals Patient Stated Goal: Home today PT Goal Formulation: With patient Time For Goal Achievement: 03/18/22 Potential to Achieve Goals: Good Progress towards PT goals: Progressing toward goals    Frequency    Min 3X/week (while in hospital acutely)      PT Plan Current plan remains appropriate (While continued inpatient rehab - SNF level, which she has declined - is indicated for [name], I'm not sure that she would flourish in another institutional environment; going home is congruent with her consistently stated wishes, and gives her more independence and agency)    Co-evaluation              AM-PAC PT "6 Clicks" Mobility   Outcome Measure  Help needed turning from your back to your side while in a flat bed without using bedrails?: None Help needed moving from lying on your back to sitting on the side of a flat bed without using bedrails?: A Little Help needed moving to and from a bed to a chair (including a wheelchair)?: A Little Help needed standing up from a chair using your arms (e.g., wheelchair or bedside chair)?: Total Help needed to walk in hospital room?: Total Help needed climbing 3-5 steps with a railing? : Total 6 Click Score: 13    End of Session Equipment Utilized During Treatment: Other (comment) (written out HEP) Activity Tolerance: Patient tolerated treatment well Patient left: in bed;with call bell/phone within reach Nurse Communication: Mobility status PT Visit Diagnosis: Other abnormalities of gait and mobility  (R26.89);Muscle weakness (generalized) (M62.81);History of falling (Z91.81)     Time: 4388-8757 PT Time Calculation (min) (ACUTE ONLY): 18 min  Charges:  $Self Care/Home Management: [name], [name] Office [phone]    [place] 03/18/2022, 11:04 AM

## 2022-03-19 ENCOUNTER — Encounter (HOSPITAL_COMMUNITY): Payer: Self-pay

## 2022-03-19 ENCOUNTER — Emergency Department (HOSPITAL_COMMUNITY)
Admission: EM | Admit: 2022-03-19 | Discharge: 2022-03-23 | Disposition: A | Discharge status: Skilled Nursing Facility | Payer: Medicare Other | Attending: Emergency Medicine | Admitting: Emergency Medicine

## 2022-03-19 ENCOUNTER — Other Ambulatory Visit: Payer: Self-pay

## 2022-03-19 DIAGNOSIS — N186 End stage renal disease: Secondary | ICD-10-CM | POA: Insufficient documentation

## 2022-03-19 DIAGNOSIS — M6281 Muscle weakness (generalized): Secondary | ICD-10-CM | POA: Insufficient documentation

## 2022-03-19 DIAGNOSIS — Z79899 Other long term (current) drug therapy: Secondary | ICD-10-CM | POA: Insufficient documentation

## 2022-03-19 DIAGNOSIS — Z8673 Personal history of transient ischemic attack (TIA), and cerebral infarction without residual deficits: Secondary | ICD-10-CM | POA: Diagnosis not present

## 2022-03-19 DIAGNOSIS — Z794 Long term (current) use of insulin: Secondary | ICD-10-CM | POA: Insufficient documentation

## 2022-03-19 DIAGNOSIS — R262 Difficulty in walking, not elsewhere classified: Secondary | ICD-10-CM | POA: Diagnosis not present

## 2022-03-19 DIAGNOSIS — E1122 Type 2 diabetes mellitus with diabetic chronic kidney disease: Secondary | ICD-10-CM | POA: Insufficient documentation

## 2022-03-19 DIAGNOSIS — Z992 Dependence on renal dialysis: Secondary | ICD-10-CM | POA: Insufficient documentation

## 2022-03-19 DIAGNOSIS — Z7689 Persons encountering health services in other specified circumstances: Secondary | ICD-10-CM | POA: Insufficient documentation

## 2022-03-19 DIAGNOSIS — Z91040 Latex allergy status: Secondary | ICD-10-CM | POA: Diagnosis not present

## 2022-03-19 DIAGNOSIS — I120 Hypertensive chronic kidney disease with stage 5 chronic kidney disease or end stage renal disease: Secondary | ICD-10-CM | POA: Insufficient documentation

## 2022-03-19 NOTE — ED Triage Notes (Signed)
Pt BIB [facility] from her home where she was just released  from being admitted here for 7 months after breaking her Lt hip & femur. This morning she was in excruciating pain & could not stand it. Her social worker then told her to come to the ED to be a border so she can get placed into a rehab facility.

## 2022-03-19 NOTE — NC FL2 (Signed)
[place] LEVEL OF CARE FORM     IDENTIFICATION  Patient Name: CLARISSA LAIRD Birthdate: 1965/10/30 Sex: female Admission Date (Current Location): 03/19/2022  [place] and [place] Number:  [name] [id number] [place] and Address:  [facility] [address], [place], [place] [address]      Provider Number: [id number]  Attending Physician Name and Address:  No att. providers found  Relative Name and Phone Number:  Alvilda Mckenna [phone]    Current Level of Care: Hospital Recommended Level of Care: [place] Prior Approval Number:    Date Approved/Denied:   PASRR Number: [id number]  Discharge Plan: SNF    Current Diagnoses: Patient Active Problem List   Diagnosis Date Noted   Wound dehiscence, surgical, sequela    Abscess of left hip 08/26/2021   History of CVA (cerebrovascular accident) 44/03/270   Hardware complicating wound infection ([place])    Subacute osteomyelitis of left femur with abscess     Septic arthritis of hip ([place]) 07/30/2021   Skin abscess L hip 07/28/2021   HCAP (healthcare-associated pneumonia) 07/27/2021   Hyperkalemia 53/66/4403   Metabolic acidemia 47/42/5956   Closed fracture of left distal femur ([place]) 07/22/2021   Type 2 diabetes mellitus with hyperlipidemia ([place]) 07/22/2021   Infection of superficial incisional surgical site after procedure 03/09/2020   Abscess    Hypoglycemia    Essential hypertension    Sleep disturbance    ESRD (end stage renal disease) on dialysis ([place]) 01/24/2020   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[place]) 01/23/2020   Hyponatremia    Constipation    Chronic osteomyelitis involving left ankle and foot ([place])    Ulcer of left foot with necrosis of bone ([place])    Wound infection 12/28/2019   History of [place] amputation of left foot ([place]) 12/25/2017   History of CVA (cerebrovascular accident) without residual deficits 07/04/2017   Cerebral  thrombosis with cerebral infarction 04/08/2017   Right sided weakness 04/07/2017   Hyperhidrosis 09/01/2016   Migraine with aura and without status migrainosus, not intractable 07/28/2016   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[place]) 08/04/2014   CKD stage 3 due to type 2 diabetes mellitus ([place]) 06/27/2014   Vitamin D insufficiency 05/08/2014   Obesity (BMI 30.0-34.9) 05/08/2014   Unilateral amputation of left foot ([place]) 05/08/2014   Bursitis of left shoulder 02/14/2014   Neck pain 01/03/2014   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.23) 01/14/2013   Insomnia 08/04/2012   Diabetic neuropathy, painful ([place]) 08/04/2011   Anxiety and depression 05/16/2010   Female stress incontinence 11/01/2007   TOBACCO DEPENDENCE 04/30/2006   GASTROESOPHAGEAL REFLUX, NO ESOPHAGITIS 04/30/2006   Irritable bowel syndrome 04/30/2006    Orientation RESPIRATION BLADDER Height & Weight     Self, Time, Situation, Place  Normal Continent Weight:   Height:     BEHAVIORAL SYMPTOMS/MOOD NEUROLOGICAL BOWEL NUTRITION STATUS      Continent Diet (Low Sodium-Heart Healthy)  AMBULATORY STATUS COMMUNICATION OF NEEDS Skin   Limited Assist Verbally Normal                       Personal Care Assistance Level of Assistance  Bathing, Feeding, Dressing Bathing Assistance: Limited assistance Feeding assistance: Independent Dressing Assistance: Limited assistance     Functional Limitations Info  Sight, Hearing, Speech Sight Info: Adequate Hearing Info: Adequate Speech Info: Adequate    SPECIAL CARE FACTORS FREQUENCY  PT (By licensed PT),  OT (By licensed OT)     PT Frequency: 5 x a week OT Frequency: 5 x a week            Contractures Contractures Info: Not present    Additional Factors Info  Code Status, Allergies Code Status Info: Full Code Allergies Info: Trazodone, Gabapentin, Lyrica (Pregabalin), Latex, and Lidocaine   Insulin Sliding Scale Info:  See Medication List       Current Medications (03/19/2022):  This is the current hospital active medication list No current facility-administered medications for this encounter.   Current Outpatient Medications  Medication Sig Dispense Refill   acetaminophen (TYLENOL) 325 MG tablet Take 2 tablets (650 mg total) by mouth every 6 (six) hours as needed for mild pain (or Fever >/= 101).     alosetron (LOTRONEX) 1 MG tablet Take 1 tablet (1 mg total) by mouth 2 (two) times daily. 60 tablet 0   amitriptyline (ELAVIL) 25 MG tablet Take 2 tablets (50 mg total) by mouth at bedtime. 60 tablet 0   atorvastatin (LIPITOR) 40 MG tablet Take 1 tablet (40 mg total) by mouth daily at 6 PM. 30 tablet 1   carvedilol (COREG) 6.25 MG tablet Take 1 tablet (6.25 mg total) by mouth 2 (two) times daily. 60 tablet 0   dexlansoprazole (DEXILANT) 60 MG capsule Take 1 capsule (60 mg total) by mouth daily. 30 capsule 0   docusate sodium (COLACE) 100 MG capsule Take 1 capsule (100 mg total) by mouth 2 (two) times daily. (Patient not taking: Reported on 08/26/2021)  0   DULoxetine (CYMBALTA) 60 MG capsule Take 1 capsule (60 mg total) by mouth daily. 30 capsule 3   fentaNYL (DURAGESIC) 12 MCG/HR Place 1 patch onto the skin every 3 (three) days. 5 patch 0   furosemide (LASIX) 80 MG tablet Take 1 tablet (80 mg total) by mouth daily. 30 tablet 0   hydrALAZINE (APRESOLINE) 50 MG tablet Take 1 tablet (50 mg total) by mouth 3 (three) times daily. 90 tablet 2   insulin aspart (NOVOLOG) 100 UNIT/ML injection Inject 6 Units into the skin 3 (three) times daily with meals. 10 mL 11   LANTUS SOLOSTAR 100 UNIT/ML Solostar Pen Inject 10 Units into the skin at bedtime.     lidocaine-prilocaine (EMLA) cream APPLY SMALL AMOUNT TO ACCESS SITE (AVF) 1 TO 2 HOURS BEFORE DIALYSIS. COVER WITH OCCLUSIVE DRESSING (SARAN WRAP)     Menthol-Methyl Salicylate (MUSCLE RUB) 10-15 % CREA Apply 1 application topically as needed for muscle pain.  0    methocarbamol (ROBAXIN) 500 MG tablet Take 1 tablet (500 mg total) by mouth 4 (four) times daily. 30 tablet 0   naloxone (NARCAN) nasal spray 4 mg/0.1 mL Place 1 spray into the nose as needed (accidental overdose). 30 g 0   nicotine (NICODERM CQ - DOSED IN MG/24 HOURS) 21 mg/24hr patch Place 1 patch (21 mg total) onto the skin daily as needed (nicotine craving). 28 patch 0   oxyCODONE (OXY IR/ROXICODONE) 5 MG immediate release tablet Take 1 tablet (5 mg total) by mouth every 8 (eight) hours as needed for moderate pain. 21 tablet 0   promethazine (PHENERGAN) 12.5 MG tablet Take 1 tablet (12.5 mg total) by mouth 2 (two) times daily as needed for vomiting or nausea. 30 tablet 0   sevelamer carbonate (RENVELA) 800 MG tablet Take 3 tablets (2,400 mg total) by mouth 3 (three) times daily with meals. 60 tablet 0   topiramate (TOPAMAX) 25 MG tablet Take  1/2 tablet (12.5 mg total) by mouth daily. 30 tablet 1     Discharge Medications: Please see discharge summary for a list of discharge medications.  Relevant Imaging Results:  Relevant Lab Results:   Additional Information SSN:  [id number].   Ht:  5'10"     Wt:  205 lbs     BMI:  29.54 kg/m2     Pt is not vaccinated for covid.  HD patient at [facility] on MWF. Pt arrives at 11:55 for 12:15 chair time.  Needs Stretcher dialysis due to inability to sit  Blondina Coderre C Tarpley-Carter, LCSWA

## 2022-03-19 NOTE — Progress Notes (Signed)
TOC CSW spoke with pt and her daughter, [name].  Pt is in need of SNF placement.  [name], MSW, LCSW-A Pronouns:  She/Her/Hers [facility] Clinical Social Worker Direct Number:  [phone] [email or web address]

## 2022-03-19 NOTE — Progress Notes (Signed)
[name] received her medications on 03/19/22 that was left here on 03/18/22. Pharmacy document was signed by this RN and patient.

## 2022-03-20 ENCOUNTER — Telehealth: Payer: Self-pay | Admitting: Orthopedic Surgery

## 2022-03-20 ENCOUNTER — Emergency Department (HOSPITAL_COMMUNITY): Payer: Medicare Other

## 2022-03-20 DIAGNOSIS — Z7689 Persons encountering health services in other specified circumstances: Secondary | ICD-10-CM | POA: Diagnosis not present

## 2022-03-20 LAB — BASIC METABOLIC PANEL
Anion gap: 15 (ref 5–15)
BUN: 91 mg/dL — ABNORMAL HIGH (ref 6–20)
CO2: 23 mmol/L (ref 22–32)
Calcium: 9 mg/dL (ref 8.9–10.3)
Chloride: 92 mmol/L — ABNORMAL LOW (ref 98–111)
Creatinine, Ser: 7.44 mg/dL — ABNORMAL HIGH (ref 0.44–1.00)
GFR, Estimated: 6 mL/min — ABNORMAL LOW (ref >60)
Glucose, Bld: 194 mg/dL — ABNORMAL HIGH (ref 70–99)
Potassium: 4.6 mmol/L (ref 3.5–5.1)
Sodium: 130 mmol/L — ABNORMAL LOW (ref 135–145)

## 2022-03-20 LAB — CBC WITH DIFFERENTIAL/PLATELET
Abs Immature Granulocytes: 0.02 10*3/uL (ref 0.00–0.07)
Basophils Absolute: 0.1 10*3/uL (ref 0.0–0.1)
Basophils Relative: 1 %
Eosinophils Absolute: 0.2 10*3/uL (ref 0.0–0.5)
Eosinophils Relative: 2 %
HCT: 34.1 % — ABNORMAL LOW (ref 36.0–46.0)
Hemoglobin: 11.5 g/dL — ABNORMAL LOW (ref 12.0–15.0)
Immature Granulocytes: 0 %
Lymphocytes Relative: 26 %
Lymphs Abs: 2 10*3/uL (ref 0.7–4.0)
MCH: 29.5 pg (ref 26.0–34.0)
MCHC: 33.7 g/dL (ref 30.0–36.0)
MCV: 87.4 fL (ref 80.0–100.0)
Monocytes Absolute: 0.9 10*3/uL (ref 0.1–1.0)
Monocytes Relative: 12 %
Neutro Abs: 4.5 10*3/uL (ref 1.7–7.7)
Neutrophils Relative %: 59 %
Platelets: 239 10*3/uL (ref 150–400)
RBC: 3.9 MIL/uL (ref 3.87–5.11)
RDW: 14.5 % (ref 11.5–15.5)
WBC: 7.7 10*3/uL (ref 4.0–10.5)
nRBC: 0 % (ref 0.0–0.2)

## 2022-03-20 LAB — PHOSPHORUS: Phosphorus: 6.4 mg/dL — ABNORMAL HIGH (ref 2.5–4.6)

## 2022-03-20 LAB — CBG MONITORING, ED
Glucose-Capillary: 181 mg/dL — ABNORMAL HIGH (ref 70–99)
Glucose-Capillary: 183 mg/dL — ABNORMAL HIGH (ref 70–99)

## 2022-03-20 LAB — MAGNESIUM: Magnesium: 2.1 mg/dL (ref 1.7–2.4)

## 2022-03-20 MED ORDER — CHLORHEXIDINE GLUCONATE CLOTH 2 % EX PADS: 6.0000 | MEDICATED_PAD | Freq: Every day | CUTANEOUS | Status: DC

## 2022-03-20 MED ORDER — LIDOCAINE-PRILOCAINE 2.5-2.5 % EX CREA: 1.0000 | TOPICAL_CREAM | CUTANEOUS | Status: DC | PRN

## 2022-03-20 MED ORDER — SEVELAMER CARBONATE 800 MG PO TABS
2400.0000 mg | ORAL_TABLET | Freq: Three times a day (TID) | ORAL | Status: DC
  Administered 2022-03-21 – 2022-03-22 (×3): 2400 mg via ORAL
  Filled 2022-03-20 (×4): qty 3

## 2022-03-20 MED ORDER — INSULIN ASPART 100 UNIT/ML IJ SOLN
6.0000 [IU] | Freq: Three times a day (TID) | INTRAMUSCULAR | Status: DC
  Administered 2022-03-21 – 2022-03-22 (×3): 6 [IU] via SUBCUTANEOUS

## 2022-03-20 MED ORDER — PROMETHAZINE HCL 25 MG PO TABS: 12.5000 mg | ORAL_TABLET | Freq: Two times a day (BID) | ORAL | Status: DC | PRN

## 2022-03-20 MED ORDER — PENTAFLUOROPROP-TETRAFLUOROETH EX AERO: 1.0000 | INHALATION_SPRAY | CUTANEOUS | Status: DC | PRN

## 2022-03-20 MED ORDER — METHOCARBAMOL 500 MG PO TABS
500.0000 mg | ORAL_TABLET | Freq: Four times a day (QID) | ORAL | Status: DC
  Administered 2022-03-20 – 2022-03-22 (×7): 500 mg via ORAL
  Filled 2022-03-20 (×7): qty 1

## 2022-03-20 MED ORDER — OXYCODONE HCL 5 MG PO TABS
5.0000 mg | ORAL_TABLET | Freq: Three times a day (TID) | ORAL | Status: DC | PRN
  Administered 2022-03-21 – 2022-03-22 (×3): 5 mg via ORAL
  Filled 2022-03-20 (×3): qty 1

## 2022-03-20 MED ORDER — HEPARIN SODIUM (PORCINE) 1000 UNIT/ML DIALYSIS
1000.0000 [IU] | INTRAMUSCULAR | Status: DC | PRN
  Administered 2022-03-21: 4000 [IU] via INTRAVENOUS_CENTRAL
  Filled 2022-03-20 (×2): qty 1

## 2022-03-20 MED ORDER — OXYCODONE-ACETAMINOPHEN 5-325 MG PO TABS
1.0000 | ORAL_TABLET | Freq: Once | ORAL | Status: AC
  Administered 2022-03-20: 1 via ORAL
  Filled 2022-03-20: qty 1

## 2022-03-20 MED ORDER — FUROSEMIDE 20 MG PO TABS
80.0000 mg | ORAL_TABLET | Freq: Every day | ORAL | Status: DC
  Administered 2022-03-21 – 2022-03-22 (×2): 80 mg via ORAL
  Filled 2022-03-20 (×2): qty 4

## 2022-03-20 MED ORDER — CARVEDILOL 3.125 MG PO TABS
6.2500 mg | ORAL_TABLET | Freq: Two times a day (BID) | ORAL | Status: DC
  Administered 2022-03-20 – 2022-03-22 (×3): 6.25 mg via ORAL
  Filled 2022-03-20 (×4): qty 2

## 2022-03-20 MED ORDER — SEVELAMER CARBONATE 800 MG PO TABS: 2400.0000 mg | ORAL_TABLET | Freq: Three times a day (TID) | ORAL | Status: DC

## 2022-03-20 MED ORDER — INSULIN GLARGINE-YFGN 100 UNIT/ML ~~LOC~~ SOLN
10.0000 [IU] | Freq: Every day | SUBCUTANEOUS | Status: DC
  Administered 2022-03-20 – 2022-03-22 (×3): 10 [IU] via SUBCUTANEOUS
  Filled 2022-03-20 (×4): qty 0.1

## 2022-03-20 MED ORDER — ACETAMINOPHEN 325 MG PO TABS
650.0000 mg | ORAL_TABLET | Freq: Four times a day (QID) | ORAL | Status: DC | PRN
  Administered 2022-03-21: 650 mg via ORAL
  Filled 2022-03-20: qty 2

## 2022-03-20 MED ORDER — ATORVASTATIN CALCIUM 40 MG PO TABS
40.0000 mg | ORAL_TABLET | Freq: Every day | ORAL | Status: DC
  Administered 2022-03-21 – 2022-03-22 (×2): 40 mg via ORAL
  Filled 2022-03-20 (×2): qty 1

## 2022-03-20 MED ORDER — TOPIRAMATE 25 MG PO TABS
12.5000 mg | ORAL_TABLET | Freq: Every day | ORAL | Status: DC
  Administered 2022-03-21 – 2022-03-22 (×2): 12.5 mg via ORAL
  Filled 2022-03-20 (×4): qty 0.5

## 2022-03-20 MED ORDER — ALTEPLASE 2 MG IJ SOLR: 2.0000 mg | Freq: Once | INTRAMUSCULAR | Status: DC | PRN

## 2022-03-20 MED ORDER — HYDRALAZINE HCL 25 MG PO TABS
50.0000 mg | ORAL_TABLET | Freq: Three times a day (TID) | ORAL | Status: DC
  Administered 2022-03-20 – 2022-03-22 (×4): 50 mg via ORAL
  Filled 2022-03-20 (×5): qty 2

## 2022-03-20 MED ORDER — DULOXETINE HCL 30 MG PO CPEP
60.0000 mg | ORAL_CAPSULE | Freq: Every day | ORAL | Status: DC
  Administered 2022-03-21 – 2022-03-22 (×2): 60 mg via ORAL
  Filled 2022-03-20 (×2): qty 2

## 2022-03-20 NOTE — Evaluation (Signed)
Physical Therapy Evaluation and Discharge Patient Details Name: [name] MRN: [id number] DOB: April 09, 1965 Today's Date: 03/20/2022  History of Present Illness  43. y.o. female presented to ED 1/17 for pain after being d/c home on 1/16 with family following a prolonged hospitalization. PMH: hypertension, hyperlipidemia, ESRD on HD MWF, history of left BKA in 2021, depression/anxiety, stroke, tobacco use, T2DM,  insomnia, chronic pain syndrome,  Clinical Impression  Patient very familiar to our services. Presents with the above complications. States she was unable to adequately care for herself with daughter's assistance after being discharged. Reports her pain was out of control the first night home and did not sleep well. Could not transfer as she had hoped. Reports home health did not show up as she had anticipated. Daughter is working and unable to provide care today. Agreeable to SNF for higher level rehab and care to regain her independence prior to returning home. Physical therapy signing-off. No acute PT needs required in ED setting and will benefit from SNF level care.     Recommendations for follow up therapy are one component of a multi-disciplinary discharge planning process, led by the attending physician.  Recommendations may be updated based on patient status, additional functional criteria and insurance authorization.  Follow Up Recommendations Skilled nursing-short term rehab (<3 hours/day) Can patient physically be transported by private vehicle: No    Assistance Recommended at Discharge Frequent or constant Supervision/Assistance  Patient can return home with the following  A little help with walking and/or transfers;A little help with bathing/dressing/bathroom;Assistance with cooking/housework;Help with stairs or ramp for entrance;Assist for transportation    Equipment Recommendations None recommended by PT  Recommendations for Other Services       Functional Status  Assessment Patient has had a recent decline in their functional status and demonstrates the ability to make significant improvements in function in a reasonable and predictable amount of time.     Precautions / Restrictions Precautions Precautions: Fall Precaution Comments: L BKA (baseline) Restrictions Weight Bearing Restrictions: No      Mobility  Bed Mobility Overal bed mobility: Needs Assistance Bed Mobility: Supine to Sit, Sit to Supine     Supine to sit: Mod assist, HOB elevated Sit to supine: Min assist   General bed mobility comments: Required mod assist for supine to sit from stretcher to sit EOB. Assited trunk and RLE. Cues for sequencing. Pt states she feels stiff from lying since yesterday. Min assist for RLE back into bed. Mod assist to scoot along stretcher.    Transfers                   General transfer comment: not safe from high stretcher with +1 assist    Ambulation/Gait                  Stairs            Wheelchair Mobility    Modified Rankin (Stroke Patients Only)       Balance Overall balance assessment: Needs assistance Sitting-balance support: No upper extremity supported, Feet supported Sitting balance-Leahy Scale: Good Sitting balance - Comments: Sits EOB several minutes, weight shifting and leaning back and forth to unload sacrum.                                     Pertinent Vitals/Pain Pain Assessment Pain Assessment: Faces Faces Pain Scale: Hurts whole lot Pain Location:  back Pain Descriptors / Indicators: Discomfort, Nagging Pain Intervention(s): Monitored during session, Repositioned    Home Living Family/patient expects to be discharged to:: Private residence Living Arrangements: Children Available Help at Discharge: Family;Available PRN/intermittently Type of Home: Mobile home Home Access: Ramped entrance       Home Layout: One level Home Equipment: Wheelchair -  manual;BSC/3in1;Rollator (4 wheels)      Prior Function Prior Level of Function : Needs assist             Mobility Comments: Prior to d/c patient able to perform lateral scoot transfers with set-up assist. Dtr assisted.       Hand Dominance   Dominant Hand: Right    Extremity/Trunk Assessment   Upper Extremity Assessment Upper Extremity Assessment: Defer to OT evaluation    Lower Extremity Assessment Lower Extremity Assessment: Generalized weakness LLE Deficits / Details: s/p Lt BKA, contracted. Lt hip ER and knee flexion       Communication   Communication: No difficulties  Cognition Arousal/Alertness: Awake/alert Behavior During Therapy: WFL for tasks assessed/performed Overall Cognitive Status: Within Functional Limits for tasks assessed                                          General Comments General comments (skin integrity, edema, etc.): Pt states she would like to go to rehab. States she did not have pain medication at home and was having too much difficulty mobilizing. Her daughter had to work and was unavailable to assist,    Exercises     Assessment/Plan    PT Assessment All further PT needs can be met in the next venue of care  PT Problem List Decreased strength;Pain;Decreased activity tolerance;Decreased balance;Decreased mobility;Decreased knowledge of precautions;Decreased range of motion;Obesity       PT Treatment Interventions      PT Goals (Current goals can be found in the Care Plan section)  Acute Rehab PT Goals Patient Stated Goal: Get adequate rehab and care PT Goal Formulation: All assessment and education complete, DC therapy    Frequency       Co-evaluation               AM-PAC PT "6 Clicks" Mobility  Outcome Measure Help needed turning from your back to your side while in a flat bed without using bedrails?: A Little Help needed moving from lying on your back to sitting on the side of a flat bed  without using bedrails?: A Lot Help needed moving to and from a bed to a chair (including a wheelchair)?: A Lot Help needed standing up from a chair using your arms (e.g., wheelchair or bedside chair)?: Total Help needed to walk in hospital room?: Total Help needed climbing 3-5 steps with a railing? : Total 6 Click Score: 10    End of Session   Activity Tolerance: Patient limited by pain Patient left:  (On stretcher)   PT Visit Diagnosis: Muscle weakness (generalized) (M62.81);Difficulty in walking, not elsewhere classified (R26.2);Pain Pain - part of body:  (back)    Time: 2951-8841 PT Time Calculation (min) (ACUTE ONLY): 14 min   Charges:   PT Evaluation $PT Eval Low Complexity: 1 Low          Candie Mile, PT, DPT Physical Therapist Acute Rehabilitation Services [place] [facility]  Mattie Marlin 03/20/2022, 11:33 AM

## 2022-03-20 NOTE — ED Provider Notes (Signed)
  Physical Exam  BP (!) 141/55 (BP Location: Right Arm)   Pulse 72   Temp 98.6 F (37 C) (Oral)   Resp 16   Ht 5\' 10"  (1.778 m)   Wt 93.4 kg   SpO2 98%   BMI 29.54 kg/m   Physical Exam Vitals and nursing note reviewed.  Constitutional:      General: [name] is not in acute distress.    Appearance: [name] is well-developed.  HENT:     Head: Normocephalic and atraumatic.  Eyes:     Conjunctiva/sclera: Conjunctivae normal.  Cardiovascular:     Rate and Rhythm: Normal rate and regular rhythm.     Heart sounds: No murmur heard. Pulmonary:     Effort: Pulmonary effort is normal. No respiratory distress.     Breath sounds: Normal breath sounds.  Abdominal:     Palpations: Abdomen is soft.     Tenderness: There is no abdominal tenderness.  Musculoskeletal:        General: No swelling.     Cervical back: Neck supple.     Comments: Left BKA  Skin:    General: Skin is warm and dry.     Capillary Refill: Capillary refill takes less than 2 seconds.  Neurological:     Mental Status: [name] is alert.  Psychiatric:        Mood and Affect: Mood normal.     Procedures  Procedures  ED Course / MDM   Clinical Course as of 03/20/22 2342  Thu Mar 20, 2022  1450 Recent dispo on 01/16 for 20month admission. ESRD MWF. Missed dialysis on WED admitted for osteomyelitis. Was discharged after refusing SNF. Now social work dispo. [ ]  FU med rec [ ]  FU Nephro consult  once BMP results  [JS]  1583 Basic metabolic panel [JS]    Clinical Course User Index [JS] Donzetta Matters, MD   Medical Decision Making [name] is a 56 year old female past medical history as documented above presenting to the emergency department for PT OT/placement.  We obtained CBC CMP as well as a mag and phosphorus.  Nephrology was consulted to evaluate the patient for potential dialysis.  Per nephrology recommendations patient will receive dialysis 01/19.  Plan per social work is to place patient on 01/19 at lunchtime.   Patient had no acute events in the emergency department requiring intervention.  Home meds were ordered [name] received 2 as needed doses of 5 mg Roxicodone.  Problems Addressed: Encounter for social work intervention: acute illness or injury  Amount and/or Complexity of Data Reviewed Labs: ordered. Decision-making details documented in ED Course. Radiology: ordered.  Risk OTC drugs. Prescription drug management.          Donzetta Matters, MD 03/20/22 2342    Tegeler, Gwenyth Allegra, MD 03/21/22 [phone]

## 2022-03-20 NOTE — Telephone Encounter (Signed)
[facility] Product/process development scientist) from [facility] called requesting a call back. Called pt for OT, PT, RRN and pt did not answer. Would like to try to reach out to pt again on Sat. 1/ 20. Please call Anderson Malta at [phone].

## 2022-03-20 NOTE — Progress Notes (Addendum)
Brief Nephrology Note  Patient presented after recent discharge saying she can't take care of herself and is now willing to go to a SNF. Last dialysis was Monday. Labs overall okay. Will arrange for dialysis in the morning. ER hoping to have her placed by tomorrow so she can leave from ER.  I would recommend obtaining routine labs. Typically takes lokelma 4 days a week. Can be ordered if K is high.  We can see more formally if she remains inpatient.

## 2022-03-20 NOTE — Progress Notes (Signed)
Pt receives out-pt HD at [facility] on MWF. Pt arrives at 11:40 for 12:00 chair time. Pt able to tolerate HD in the chair on 03/14/22. Clinic advised that snf placement is in progress. Will assist as needed.   Melven Sartorius Renal Navigator [phone]

## 2022-03-20 NOTE — Progress Notes (Signed)
TOC CSW contacted [facility] [phone].  [name] has accepted pt for tomorrow admission.  [name] will also make arrangements with [facility] for HD.  [name], MSW, LCSW-A Pronouns:  She/Her/Hers [facility] of Care Clinical Social Worker Direct Number:  [phone] [name]

## 2022-03-20 NOTE — Progress Notes (Signed)
TOC CSW presented pt with bed offers.    Lenzi Marmo Tarpley-Carter, MSW, LCSW-A Pronouns:  She/Her/Hers [facility] of Care Clinical Social Worker Direct Number:  [phone] [email or web address]

## 2022-03-20 NOTE — Telephone Encounter (Signed)
Tried calling to discuss if anything needed from [place]. No answer. LMVM to return call.

## 2022-03-20 NOTE — ED Provider Notes (Signed)
[facility] EMERGENCY DEPARTMENT Provider Note   CSN: [id number] Arrival date & time: 03/19/22  1334     History  No chief complaint on file.   [name] is a 57 y.o. female.  57 year old female with past medical history of diabetes, end-stage renal disease on dialysis (Monday/Wednesday/Friday, last full session on Monday, left arm fistula), CVA, hypertension, depression, recently discharged from the hospital on 03/18/2022 following 97-month admission for complications secondary to osteomyelitis in the left femur.  Patient was discharged home however notes that her daughter works and is unable to stay home and care for her and patient is unable to care for herself.  Patient returned to the emergency room requesting SNF placement.  She does not have complaints of fever, chest pain, shortness of breath or other acute complaints.       Home Medications Prior to Admission medications   Medication Sig Start Date End Date Taking? Authorizing Provider  acetaminophen (TYLENOL) 325 MG tablet Take 2 tablets (650 mg total) by mouth every 6 (six) hours as needed for mild pain (or Fever >/= 101). 08/16/21   Pokhrel, Corrie Mckusick, MD  alosetron (LOTRONEX) 1 MG tablet Take 1 tablet (1 mg total) by mouth 2 (two) times daily. 03/18/22   Regalado, Belkys A, MD  amitriptyline (ELAVIL) 25 MG tablet Take 2 tablets (50 mg total) by mouth at bedtime. 03/18/22   Regalado, Belkys A, MD  atorvastatin (LIPITOR) 40 MG tablet Take 1 tablet (40 mg total) by mouth daily at 6 PM. 03/18/22   Regalado, Belkys A, MD  carvedilol (COREG) 6.25 MG tablet Take 1 tablet (6.25 mg total) by mouth 2 (two) times daily. 03/18/22   Regalado, Belkys A, MD  dexlansoprazole (DEXILANT) 60 MG capsule Take 1 capsule (60 mg total) by mouth daily. 03/18/22   Regalado, Belkys A, MD  docusate sodium (COLACE) 100 MG capsule Take 1 capsule (100 mg total) by mouth 2 (two) times daily. Patient not taking: Reported on 08/26/2021 08/16/21    Flora Lipps, MD  DULoxetine (CYMBALTA) 60 MG capsule Take 1 capsule (60 mg total) by mouth daily. 03/18/22   Regalado, Belkys A, MD  fentaNYL (DURAGESIC) 12 MCG/HR Place 1 patch onto the skin every 3 (three) days. 03/19/22   Regalado, Belkys A, MD  furosemide (LASIX) 80 MG tablet Take 1 tablet (80 mg total) by mouth daily. 03/18/22   Regalado, Belkys A, MD  hydrALAZINE (APRESOLINE) 50 MG tablet Take 1 tablet (50 mg total) by mouth 3 (three) times daily. 03/18/22   Regalado, Belkys A, MD  insulin aspart (NOVOLOG) 100 UNIT/ML injection Inject 6 Units into the skin 3 (three) times daily with meals. 03/18/22   Regalado, Belkys A, MD  LANTUS SOLOSTAR 100 UNIT/ML Solostar Pen Inject 10 Units into the skin at bedtime. 07/26/21   [provider]  lidocaine-prilocaine (EMLA) cream APPLY SMALL AMOUNT TO ACCESS SITE (AVF) 1 TO 2 HOURS BEFORE DIALYSIS. COVER WITH OCCLUSIVE DRESSING (SARAN WRAP) 06/24/21   [provider]  Menthol-Methyl Salicylate (MUSCLE RUB) 10-15 % CREA Apply 1 application topically as needed for muscle pain. 02/02/20   Love, Ivan Anchors, PA-C  methocarbamol (ROBAXIN) 500 MG tablet Take 1 tablet (500 mg total) by mouth 4 (four) times daily. 03/18/22   Regalado, Belkys A, MD  naloxone (NARCAN) nasal spray 4 mg/0.1 mL Place 1 spray into the nose as needed (accidental overdose). 03/18/22   Regalado, Belkys A, MD  nicotine (NICODERM CQ - DOSED IN MG/24 HOURS) 21  mg/24hr patch Place 1 patch (21 mg total) onto the skin daily as needed (nicotine craving). 03/18/22   Regalado, Belkys A, MD  oxyCODONE (OXY IR/ROXICODONE) 5 MG immediate release tablet Take 1 tablet (5 mg total) by mouth every 8 (eight) hours as needed for moderate pain. 03/18/22   Regalado, Belkys A, MD  promethazine (PHENERGAN) 12.5 MG tablet Take 1 tablet (12.5 mg total) by mouth 2 (two) times daily as needed for vomiting or nausea. 03/18/22   Regalado, Belkys A, MD  sevelamer carbonate (RENVELA) 800 MG tablet Take 3 tablets  (2,400 mg total) by mouth 3 (three) times daily with meals. 03/18/22   Regalado, Belkys A, MD  topiramate (TOPAMAX) 25 MG tablet Take 1/2 tablet (12.5 mg total) by mouth daily. 03/18/22   Regalado, Belkys A, MD      Allergies    Trazodone, Gabapentin, Lyrica [pregabalin], Latex, and Lidocaine    Review of Systems   Review of Systems Negative except as per HPI Physical Exam Updated Vital Signs BP (!) 171/66 (BP Location: Right Arm)   Pulse 68   Temp 97.9 F (36.6 C)   Resp 17   SpO2 96%  Physical Exam Vitals and nursing note reviewed.  Constitutional:      General: She is not in acute distress.    Appearance: She is well-developed. She is not diaphoretic.  HENT:     Head: Normocephalic and atraumatic.  Cardiovascular:     Rate and Rhythm: Normal rate and regular rhythm.     Pulses: Normal pulses.     Heart sounds: Normal heart sounds.  Pulmonary:     Effort: Pulmonary effort is normal.     Breath sounds: Normal breath sounds.  Abdominal:     Palpations: Abdomen is soft.     Tenderness: There is no abdominal tenderness.  Musculoskeletal:     Comments: Left BKA  Skin:    General: Skin is warm and dry.     Findings: No erythema or rash.  Neurological:     Mental Status: She is alert and oriented to person, place, and time.  Psychiatric:        Behavior: Behavior normal.     ED Results / Procedures / Treatments   Labs (all labs ordered are listed, but only abnormal results are displayed) Labs Reviewed  CBG MONITORING, ED - Abnormal; Notable for the following components:      Result Value   Glucose-Capillary 181 (*)    All other components within normal limits  CBC  BASIC METABOLIC PANEL    EKG None  Radiology No results found.  Procedures Procedures    Medications Ordered in ED Medications - No data to display  ED Course/ Medical Decision Making/ A&P Clinical Course as of 03/20/22 1454  Thu Mar 20, 2022  1450 Recent dispo on 01/16 for 3month  admission. ESRD MWF. Missed dialysis on WED admitted for osteomyelitis. Was discharged after refusing SNF. Now social work dispo. [ ]  FU med rec [ ]  FU Nephro consult  once BMP results  [JS]    Clinical Course User Index [JS] Donzetta Matters, MD                             Medical Decision Making Amount and/or Complexity of Data Reviewed Radiology: ordered.  Risk Prescription drug management.   This patient presents to the ED for concern of inability to care for self at home, this involves  an extensive number of treatment options, and is a complaint that carries with it a high risk of complications and morbidity.  The differential diagnosis includes failure to thrive   Co morbidities that complicate the patient evaluation  ESRD, DM, septic hip arthritis, CVA, osteomyelitis, additional history reviewed in chart   Additional history obtained:   External records from outside source obtained and reviewed including discharge summary dated 03/18/2022   Lab Tests:  I Ordered, and personally interpreted labs.  The pertinent results include: CBC, BMP.  Results pending at time of signout.   Consultations Obtained:  Consult to nephrology to arrange dialysis while awaiting SNF placement pending lab work-up at time of sign out.    Problem List / ED Course / Critical interventions / Medication management  57 year old female returns to the hospital after dc on 5/68/61 following complicated hospital stay of about 6 months. Patient lives with her daughter, daughter works full time and is unable to care for patient. Patient feels she is too deconditioned and unable to care for self. Missed dialysis yesterday, no complaints of SHOB today, had full dialysis on Monday. Patient requesting SNF placement. SW is aware and is working on plan for patient. Care singed out at end of shift, pending labs for nephrology consult for dialysis plan while patient is holding for placement. Requested pharmacy to  complete med req to order home meds.  I have reviewed the patients home medicines and have made adjustments as needed   Social Determinants of Health:  Left BKA, lives with daughter who is unable to provide full time care in home, patient unable to get to dialysis.    Test / Admission - Considered:  Plan is to hold for SNF placement.         Final Clinical Impression(s) / ED Diagnoses Final diagnoses:  None    Rx / DC Orders ED Discharge Orders     None         Tacy Learn, PA-C 03/20/22 1747    Fransico Meadow, MD 03/20/22 1815

## 2022-03-20 NOTE — NC FL2 (Signed)
[place] LEVEL OF CARE FORM     IDENTIFICATION  Patient Name: [name] Birthdate: [birth year] Sex: female Admission Date (Current Location): 03/19/2022  [place] and [place] Number:  [name] [id number] [place] and Address:  [facility], [place] [address], [place],  [address]      Provider Number: [id number]  Attending Physician Name and Address:  Fransico Meadow, MD  Relative Name and Phone Number:  Michaelina Blandino [phone]    Current Level of Care: Hospital Recommended Level of Care: [place] Prior Approval Number:    Date Approved/Denied:   PASRR Number: [id number]  Discharge Plan: SNF    Current Diagnoses: Patient Active Problem List   Diagnosis Date Noted   Wound dehiscence, surgical, sequela    Abscess of left hip 08/26/2021   History of CVA (cerebrovascular accident) 60/73/7106   Hardware complicating wound infection ([place])    Subacute osteomyelitis of left femur with abscess     Septic arthritis of hip ([place]) 07/30/2021   Skin abscess L hip 07/28/2021   HCAP (healthcare-associated pneumonia) 07/27/2021   Hyperkalemia 26/94/8546   Metabolic acidemia 27/05/5007   Closed fracture of left distal femur ([place]) 07/22/2021   Type 2 diabetes mellitus with hyperlipidemia ([place]) 07/22/2021   Infection of superficial incisional surgical site after procedure 03/09/2020   Abscess    Hypoglycemia    Essential hypertension    Sleep disturbance    ESRD (end stage renal disease) on dialysis ([place]) 01/24/2020   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[place]) 01/23/2020   Hyponatremia    Constipation    Chronic osteomyelitis involving left ankle and foot ([place])    Ulcer of left foot with necrosis of bone ([place])    Wound infection 12/28/2019   History of [place] amputation of left foot ([place]) 12/25/2017   History of CVA (cerebrovascular accident) without residual deficits 07/04/2017   Cerebral  thrombosis with cerebral infarction 04/08/2017   Right sided weakness 04/07/2017   Hyperhidrosis 09/01/2016   Migraine with aura and without status migrainosus, not intractable 07/28/2016   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[place]) 08/04/2014   CKD stage 3 due to type 2 diabetes mellitus ([place]) 06/27/2014   Vitamin D insufficiency 05/08/2014   Obesity (BMI 30.0-34.9) 05/08/2014   Unilateral amputation of left foot ([place]) 05/08/2014   Bursitis of left shoulder 02/14/2014   Neck pain 01/03/2014   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.23) 01/14/2013   Insomnia 08/04/2012   Diabetic neuropathy, painful ([place]) 08/04/2011   Anxiety and depression 05/16/2010   Female stress incontinence 11/01/2007   TOBACCO DEPENDENCE 04/30/2006   GASTROESOPHAGEAL REFLUX, NO ESOPHAGITIS 04/30/2006   Irritable bowel syndrome 04/30/2006    Orientation RESPIRATION BLADDER Height & Weight     Self, Time, Situation, Place  Normal Continent Weight:  205 lbs Height: 5'10   BEHAVIORAL SYMPTOMS/MOOD NEUROLOGICAL BOWEL NUTRITION STATUS      Continent Diet (Low Sodium-Heart Healthy)  AMBULATORY STATUS COMMUNICATION OF NEEDS Skin   Extensive Assist Verbally Normal                       Personal Care Assistance Level of Assistance  Bathing, Feeding, Dressing Bathing Assistance: Limited assistance Feeding assistance: Independent Dressing Assistance: Limited assistance     Functional Limitations Info  Sight, Hearing, Speech Sight Info: Adequate Hearing Info: Adequate Speech Info: Adequate    SPECIAL CARE FACTORS FREQUENCY  PT (By licensed PT),  OT (By licensed OT)     PT Frequency: 5X Weekly OT Frequency: 5 x a week            Contractures Contractures Info: Not present    Additional Factors Info  Code Status, Allergies Code Status Info: Full Allergies Info: Trazodone, Gabapentin, Lyrica (Pregabalin), Latex, and Lidocaine   Insulin Sliding Scale  Info: See Medication List       Current Medications (03/20/2022):  This is the current hospital active medication list No current facility-administered medications for this encounter.   Current Outpatient Medications  Medication Sig Dispense Refill   acetaminophen (TYLENOL) 325 MG tablet Take 2 tablets (650 mg total) by mouth every 6 (six) hours as needed for mild pain (or Fever >/= 101).     alosetron (LOTRONEX) 1 MG tablet Take 1 tablet (1 mg total) by mouth 2 (two) times daily. 60 tablet 0   amitriptyline (ELAVIL) 25 MG tablet Take 2 tablets (50 mg total) by mouth at bedtime. 60 tablet 0   atorvastatin (LIPITOR) 40 MG tablet Take 1 tablet (40 mg total) by mouth daily at 6 PM. 30 tablet 1   carvedilol (COREG) 6.25 MG tablet Take 1 tablet (6.25 mg total) by mouth 2 (two) times daily. 60 tablet 0   dexlansoprazole (DEXILANT) 60 MG capsule Take 1 capsule (60 mg total) by mouth daily. 30 capsule 0   docusate sodium (COLACE) 100 MG capsule Take 1 capsule (100 mg total) by mouth 2 (two) times daily. (Patient not taking: Reported on 08/26/2021)  0   DULoxetine (CYMBALTA) 60 MG capsule Take 1 capsule (60 mg total) by mouth daily. 30 capsule 3   fentaNYL (DURAGESIC) 12 MCG/HR Place 1 patch onto the skin every 3 (three) days. 5 patch 0   furosemide (LASIX) 80 MG tablet Take 1 tablet (80 mg total) by mouth daily. 30 tablet 0   hydrALAZINE (APRESOLINE) 50 MG tablet Take 1 tablet (50 mg total) by mouth 3 (three) times daily. 90 tablet 2   insulin aspart (NOVOLOG) 100 UNIT/ML injection Inject 6 Units into the skin 3 (three) times daily with meals. 10 mL 11   LANTUS SOLOSTAR 100 UNIT/ML Solostar Pen Inject 10 Units into the skin at bedtime.     lidocaine-prilocaine (EMLA) cream APPLY SMALL AMOUNT TO ACCESS SITE (AVF) 1 TO 2 HOURS BEFORE DIALYSIS. COVER WITH OCCLUSIVE DRESSING (SARAN WRAP)     Menthol-Methyl Salicylate (MUSCLE RUB) 10-15 % CREA Apply 1 application topically as needed for muscle pain.  0    methocarbamol (ROBAXIN) 500 MG tablet Take 1 tablet (500 mg total) by mouth 4 (four) times daily. 30 tablet 0   naloxone (NARCAN) nasal spray 4 mg/0.1 mL Place 1 spray into the nose as needed (accidental overdose). 30 g 0   nicotine (NICODERM CQ - DOSED IN MG/24 HOURS) 21 mg/24hr patch Place 1 patch (21 mg total) onto the skin daily as needed (nicotine craving). 28 patch 0   oxyCODONE (OXY IR/ROXICODONE) 5 MG immediate release tablet Take 1 tablet (5 mg total) by mouth every 8 (eight) hours as needed for moderate pain. 21 tablet 0   promethazine (PHENERGAN) 12.5 MG tablet Take 1 tablet (12.5 mg total) by mouth 2 (two) times daily as needed for vomiting or nausea. 30 tablet 0   sevelamer carbonate (RENVELA) 800 MG tablet Take 3 tablets (2,400 mg total) by mouth 3 (three) times daily with meals. 60 tablet 0   topiramate (TOPAMAX) 25 MG tablet Take 1/2 tablet (12.5  mg total) by mouth daily. 30 tablet 1     Discharge Medications: Please see discharge summary for a list of discharge medications.  Relevant Imaging Results:  Relevant Lab Results:   Additional Information SSN:  [id number].  HD patient at [facility] on MWF. Pt arrives at 11:40 for 12:00 chair time.  Raina Mina, LCSWA

## 2022-03-20 NOTE — Progress Notes (Signed)
TOC CSW spoke with pt.  Pt has accepted [facility]'s bed offer.  Elizabethann Lackey Tarpley-Carter, MSW, LCSW-A Pronouns:  She/Her/Hers [facility] Health[facility] of Care Clinical Social Worker Direct Number:  [phone] [email or web address]

## 2022-03-21 DIAGNOSIS — Z7689 Persons encountering health services in other specified circumstances: Secondary | ICD-10-CM | POA: Diagnosis not present

## 2022-03-21 LAB — RENAL FUNCTION PANEL
Albumin: 3.8 g/dL (ref 3.5–5.0)
Anion gap: 15 (ref 5–15)
BUN: 96 mg/dL — ABNORMAL HIGH (ref 6–20)
CO2: 22 mmol/L (ref 22–32)
Calcium: 8.7 mg/dL — ABNORMAL LOW (ref 8.9–10.3)
Chloride: 94 mmol/L — ABNORMAL LOW (ref 98–111)
Creatinine, Ser: 8.06 mg/dL — ABNORMAL HIGH (ref 0.44–1.00)
GFR, Estimated: 5 mL/min — ABNORMAL LOW (ref >60)
Glucose, Bld: 172 mg/dL — ABNORMAL HIGH (ref 70–99)
Phosphorus: 6.7 mg/dL — ABNORMAL HIGH (ref 2.5–4.6)
Potassium: 4.2 mmol/L (ref 3.5–5.1)
Sodium: 131 mmol/L — ABNORMAL LOW (ref 135–145)

## 2022-03-21 LAB — CBC
HCT: 34.4 % — ABNORMAL LOW (ref 36.0–46.0)
Hemoglobin: 11.1 g/dL — ABNORMAL LOW (ref 12.0–15.0)
MCH: 29.1 pg (ref 26.0–34.0)
MCHC: 32.3 g/dL (ref 30.0–36.0)
MCV: 90.1 fL (ref 80.0–100.0)
Platelets: 254 10*3/uL (ref 150–400)
RBC: 3.82 MIL/uL — ABNORMAL LOW (ref 3.87–5.11)
RDW: 14.4 % (ref 11.5–15.5)
WBC: 8.9 10*3/uL (ref 4.0–10.5)
nRBC: 0 % (ref 0.0–0.2)

## 2022-03-21 LAB — CBG MONITORING, ED
Glucose-Capillary: 257 mg/dL — ABNORMAL HIGH (ref 70–99)
Glucose-Capillary: 175 mg/dL — ABNORMAL HIGH (ref 70–99)

## 2022-03-21 LAB — HEPATITIS B SURFACE ANTIGEN: Hepatitis B Surface Ag: NONREACTIVE

## 2022-03-21 MED ORDER — HEPARIN SODIUM (PORCINE) 1000 UNIT/ML IJ SOLN
2000.0000 [IU] | Freq: Once | INTRAMUSCULAR | Status: AC
  Administered 2022-03-21: 2000 [IU] via INTRAVENOUS

## 2022-03-21 MED ORDER — INSULIN ASPART 100 UNIT/ML IJ SOLN
0.0000 [IU] | Freq: Three times a day (TID) | INTRAMUSCULAR | Status: DC
  Administered 2022-03-21 – 2022-03-22 (×2): 5 [IU] via SUBCUTANEOUS
  Administered 2022-03-22: 2 [IU] via SUBCUTANEOUS

## 2022-03-21 NOTE — Progress Notes (Addendum)
This CSW has reached out to [facility] to inquire about admission time. TOC following.   Addend @ 10:14 AM This CSW received a text from [facility] at 9:41 AM stating the pt is under review by her Market researcher. TOC following.  Addend @ 12:18 PM This CSW requested an update from [place] at 12:03 PM.

## 2022-03-21 NOTE — Procedures (Signed)
Patient seen and examined on Hemodialysis. The procedure was supervised and I have made appropriate changes. BP (!) 122/96 (BP Location: Right Arm)   Pulse 77   Temp 98.4 F (36.9 C) (Oral)   Resp 15   Ht 5\' 10"  (1.778 m)   Wt 93.4 kg Comment: unable to obtained in stretcher  SpO2 96%   BMI 29.54 kg/m   QB 400 mL min, UF goal 3L--> 1.2L  Tolerating treatment without complaints at this time.  Had a couple of BP drops so saline given and goal lowered.  Likely will be transferred to [facility].     Madelon Lips MD [facility] Pgr [phone] 12:13 PM

## 2022-03-21 NOTE — Progress Notes (Addendum)
This CSW verified a bed ith [facility].   This CSW spoke to the pt to inform of [facility] no longer being able to accept her. The pt inquired about why and this CSW informed they are unable to accept pt's from the ED. The pt inquired about if she can be admitted to Inpatient. This CSW informed that she must me inpatient criteria in order to be admitted into inpatient floor.This CSW presented  other two bed offers and the pt states "it's more than two." This CSW reminded the pt that there were 3 bed offers presented to her on yesterday and one is unable to admit her so there are two left. Pt responds "Well I'm not going anywhere until I see a doctor. My blood pressure has dropped 3 times, I'm in severe pain in my stomach and my back." This CSW inquired about whether she's informed the RN, she said she has and the RN reported she will have the provider come in to see her.   This CSW spoke with the pt's daughter, [name], and provided her updates. This CSW has texted Lauren at [facility] [phone]) to update her.   If pt is medically stable, pt will need to select a bed between the two acceptances in the hub.

## 2022-03-21 NOTE — ED Notes (Signed)
Assisted pt onto bedpan without difficulty.

## 2022-03-21 NOTE — Discharge Planning (Signed)
RNCM coordinating with EDSW for discharge planning. Pt needs HD set up in [place]; proof of 3-day stay and transportation accommodations  RNCM reached out to [facility], Renal Navigator to coordinate HD Center transfer; provided pt previous hospital stay as 3-day qualifying and confirmed that pt can transport in wheelchair.   TOC continues to follow.Donna KitchenMarland Martinez

## 2022-03-21 NOTE — Progress Notes (Addendum)
Contacted by ED case management staff with request for pt to be transferred to [facility] clinic due to snf placement. Referral submitted to [facility] admissions this morning for review. Also spoke to [facility] admissions rep regarding pt's case. If [facility] does not have availability then [facility] may be an option. This info was provided to ED RN CM. Will provide update to staff as information from [facility] is received.   Melven Sartorius Renal Navigator [phone]  Addendum at 3:28 pm: Case discussed with RN CM. SNF placement is to be determined. Referral to [facility] placed on hold per case management request due to snf in [place] not being willing to accept pt at this time. Will assist as needed.

## 2022-03-21 NOTE — Progress Notes (Signed)
Called to get report for hemodialysis I was told nurse is not available will call me back to get report

## 2022-03-21 NOTE — Progress Notes (Signed)
TOC CSW spoke with pt and pts daughter, [name].  They both are in agreeance to go to [facility].  CSW will reach out to admissions.  Shanley Furlough Tarpley-Carter, MSW, LCSW-A Pronouns:  She/Her/Hers [facility] HealthTransitions of Care Clinical Social Worker Direct Number:  [phone] [email or web address]

## 2022-03-21 NOTE — ED Notes (Signed)
Ordered a late lunch tray

## 2022-03-21 NOTE — Progress Notes (Addendum)
TOC CSW contacted Ebony Hail, Admissions for [facility] [phone].  Pt is currently with [facility] in [place], MWF @ 11:40am.  Pt can be dc'd to [facility] on 03/22/2022.    Kurtiss Wence Tarpley-Carter, MSW, LCSW-A Pronouns:  She/Her/Hers [facility] HealthTransitions of Care Clinical Social Worker Direct Number:  [phone] [email or web address]

## 2022-03-21 NOTE — Progress Notes (Signed)
Received patient in bed to unit.  Alert and oriented.  Informed consent signed and in chart.   Treatment initiated: 07:44 Treatment completed: 11:52  Patient tolerated well.  Transported back to the room  Alert, without acute distress.  Hand-off given to patient's nurse.   Access used: left AVF Access issues: none  Total UF removed: 1.2L Medication(s) given: tylenol    03/21/22 1152  Vitals  Temp 98.4 F (36.9 C)  Temp Source Oral  BP (!) 122/96  MAP (mmHg) 106  BP Location Right Arm  BP Method Automatic  Patient Position (if appropriate) Lying  Pulse Rate 77  Pulse Rate Source Monitor  ECG Heart Rate 77  Resp 15  Oxygen Therapy  SpO2 96 %  O2 Device Room Air  Patient Activity (if Appropriate) In bed  Pulse Oximetry Type Continuous  Oximetry Probe Site Changed No  Pre Treatment  Weight  (unable to obtain on stretcher)  Type of Weight Post-Dialysis  During Treatment Monitoring  Blood Flow Rate (mL/min) 400 mL/min  Arterial Pressure (mmHg) -180 mmHg  Venous Pressure (mmHg) 270 mmHg  TMP (mmHg) 15 mmHg  Ultrafiltration Rate (mL/min) 0 mL/min  Dialysate Flow Rate (mL/min) 300 ml/min  HD Safety Checks Performed Yes  Intra-Hemodialysis Comments Tx completed  Post Treatment  Dialyzer Clearance Lightly streaked  Duration of HD Treatment -hour(s) 4 hour(s)  Liters Processed 96  Fluid Removed (mL) 1200 mL  Tolerated HD Treatment Yes  Post-Hemodialysis Comments pt didnt tolerate UF well today was out of UF most of tx  AVG/AVF Arterial Site Held (minutes) 5 minutes  AVG/AVF Venous Site Held (minutes) 5 minutes  Fistula / Graft Left Upper arm Arteriovenous fistula  Placement Date/Time: 01/09/20 1121   Placed prior to admission: No  Orientation: (c) Left  Access Location: Upper arm  Access Type: Arteriovenous fistula  Site Condition No complications  Fistula / Graft Assessment Present;Thrill;Bruit  Status Deaccessed  Drainage Description None      Analiza Cowger S  [facility]

## 2022-03-22 DIAGNOSIS — Z7689 Persons encountering health services in other specified circumstances: Secondary | ICD-10-CM | POA: Diagnosis not present

## 2022-03-22 LAB — CBC WITH DIFFERENTIAL/PLATELET
Abs Immature Granulocytes: 0.03 10*3/uL (ref 0.00–0.07)
Basophils Absolute: 0.1 10*3/uL (ref 0.0–0.1)
Basophils Relative: 1 %
Eosinophils Absolute: 0.1 10*3/uL (ref 0.0–0.5)
Eosinophils Relative: 1 %
HCT: 35 % — ABNORMAL LOW (ref 36.0–46.0)
Hemoglobin: 11.5 g/dL — ABNORMAL LOW (ref 12.0–15.0)
Immature Granulocytes: 0 %
Lymphocytes Relative: 21 %
Lymphs Abs: 1.8 10*3/uL (ref 0.7–4.0)
MCH: 29.2 pg (ref 26.0–34.0)
MCHC: 32.9 g/dL (ref 30.0–36.0)
MCV: 88.8 fL (ref 80.0–100.0)
Monocytes Absolute: 1.3 10*3/uL — ABNORMAL HIGH (ref 0.1–1.0)
Monocytes Relative: 16 %
Neutro Abs: 4.9 10*3/uL (ref 1.7–7.7)
Neutrophils Relative %: 61 %
Platelets: 228 10*3/uL (ref 150–400)
RBC: 3.94 MIL/uL (ref 3.87–5.11)
RDW: 14.3 % (ref 11.5–15.5)
WBC: 8.2 10*3/uL (ref 4.0–10.5)
nRBC: 0 % (ref 0.0–0.2)

## 2022-03-22 LAB — COMPREHENSIVE METABOLIC PANEL
ALT: 40 U/L (ref 0–44)
AST: 16 U/L (ref 15–41)
Albumin: 3.7 g/dL (ref 3.5–5.0)
Alkaline Phosphatase: 122 U/L (ref 38–126)
Anion gap: 13 (ref 5–15)
BUN: 51 mg/dL — ABNORMAL HIGH (ref 6–20)
CO2: 26 mmol/L (ref 22–32)
Calcium: 9 mg/dL (ref 8.9–10.3)
Chloride: 93 mmol/L — ABNORMAL LOW (ref 98–111)
Creatinine, Ser: 5.71 mg/dL — ABNORMAL HIGH (ref 0.44–1.00)
GFR, Estimated: 8 mL/min — ABNORMAL LOW (ref >60)
Glucose, Bld: 114 mg/dL — ABNORMAL HIGH (ref 70–99)
Potassium: 3.8 mmol/L (ref 3.5–5.1)
Sodium: 132 mmol/L — ABNORMAL LOW (ref 135–145)
Total Bilirubin: 0.9 mg/dL (ref 0.3–1.2)
Total Protein: 7.7 g/dL (ref 6.5–8.1)

## 2022-03-22 LAB — CBG MONITORING, ED
Glucose-Capillary: 192 mg/dL — ABNORMAL HIGH (ref 70–99)
Glucose-Capillary: 141 mg/dL — ABNORMAL HIGH (ref 70–99)
Glucose-Capillary: 262 mg/dL — ABNORMAL HIGH (ref 70–99)
Glucose-Capillary: 165 mg/dL — ABNORMAL HIGH (ref 70–99)

## 2022-03-22 LAB — TROPONIN I (HIGH SENSITIVITY)
Troponin I (High Sensitivity): 6 ng/L (ref <18)
Troponin I (High Sensitivity): 5 ng/L (ref <18)

## 2022-03-22 LAB — HEPATITIS B SURFACE ANTIBODY, QUANTITATIVE: Hep B S AB Quant (Post): <3.1 m[IU]/mL — ABNORMAL LOW (ref >9.9)

## 2022-03-22 LAB — LIPASE, BLOOD: Lipase: 32 U/L (ref 11–51)

## 2022-03-22 MED ORDER — FAMOTIDINE 20 MG PO TABS
20.0000 mg | ORAL_TABLET | Freq: Once | ORAL | Status: AC
  Administered 2022-03-22: 20 mg via ORAL
  Filled 2022-03-22: qty 1

## 2022-03-22 MED ORDER — ALOSETRON HCL 1 MG PO TABS
1.0000 mg | ORAL_TABLET | Freq: Two times a day (BID) | ORAL | Status: DC
  Administered 2022-03-22: 1 mg via ORAL
  Filled 2022-03-22 (×4): qty 1

## 2022-03-22 MED ORDER — ALUM & MAG HYDROXIDE-SIMETH 200-200-20 MG/5ML PO SUSP
15.0000 mL | Freq: Once | ORAL | Status: AC
  Administered 2022-03-22: 15 mL via ORAL
  Filled 2022-03-22: qty 30

## 2022-03-22 NOTE — NC FL2 (Addendum)
[place] LEVEL OF CARE FORM     IDENTIFICATION  Patient Name: [name] Birthdate: December 13, 1965 Sex: female Admission Date (Current Location): 03/19/2022  [place] and [place] Number:  [name] [id number] [place] and Address:  [facility], [place] [address] [place], [place] [address]      Provider Number: [id number]  Attending Physician Name and Address:  Default, Provider, MD  Relative Name and Phone Number:  [name] [phone]    Current Level of Care: Hospital Recommended Level of Care: [place] Prior Approval Number:    Date Approved/Denied:   PASRR Number: [id number]  Discharge Plan: SNF    Current Diagnoses: Patient Active Problem List   Diagnosis Date Noted   Wound dehiscence, surgical, sequela    Abscess of left hip 08/26/2021   History of CVA (cerebrovascular accident) 54/00/8676   Hardware complicating wound infection ([place])    Subacute osteomyelitis of left femur with abscess     Septic arthritis of hip ([place]) 07/30/2021   Skin abscess L hip 07/28/2021   HCAP (healthcare-associated pneumonia) 07/27/2021   Hyperkalemia 19/50/9326   Metabolic acidemia 71/24/5809   Closed fracture of left distal femur ([place]) 07/22/2021   Type 2 diabetes mellitus with hyperlipidemia ([place]) 07/22/2021   Infection of superficial incisional surgical site after procedure 03/09/2020   Abscess    Hypoglycemia    Essential hypertension    Sleep disturbance    ESRD (end stage renal disease) on dialysis ([place]) 01/24/2020   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[place]) 01/23/2020   Hyponatremia    Constipation    Chronic osteomyelitis involving left ankle and foot ([place])    Ulcer of left foot with necrosis of bone ([place])    Wound infection 12/28/2019   History of Chopart amputation of left foot ([place]) 12/25/2017   History of CVA (cerebrovascular accident) without residual deficits 07/04/2017   Cerebral  thrombosis with cerebral infarction 04/08/2017   Right sided weakness 04/07/2017   Hyperhidrosis 09/01/2016   Migraine with aura and without status migrainosus, not intractable 07/28/2016   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[place]) 08/04/2014   CKD stage 3 due to type 2 diabetes mellitus ([place]) 06/27/2014   Vitamin D insufficiency 05/08/2014   Obesity (BMI 30.0-34.9) 05/08/2014   Unilateral amputation of left foot ([place]) 05/08/2014   Bursitis of left shoulder 02/14/2014   Neck pain 01/03/2014   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.23) 01/14/2013   Insomnia 08/04/2012   Diabetic neuropathy, painful ([place]) 08/04/2011   Anxiety and depression 05/16/2010   Female stress incontinence 11/01/2007   TOBACCO DEPENDENCE 04/30/2006   GASTROESOPHAGEAL REFLUX, NO ESOPHAGITIS 04/30/2006   Irritable bowel syndrome 04/30/2006    Orientation RESPIRATION BLADDER Height & Weight     Self, Time, Situation, Place  Normal Continent Weight:  (unable to obtain on stretcher) Height:  5\' 10"  (177.8 cm)  BEHAVIORAL SYMPTOMS/MOOD NEUROLOGICAL BOWEL NUTRITION STATUS      Continent Diet (Low Sodium - Heart Healthy)  AMBULATORY STATUS COMMUNICATION OF NEEDS Skin   Extensive Assist Verbally Normal                       Personal Care Assistance Level of Assistance  Bathing, Feeding, Dressing Bathing Assistance: Limited assistance Feeding assistance: Independent Dressing Assistance: Limited assistance     Functional Limitations Info  Sight, Hearing, Speech Sight Info: Adequate Hearing Info: Adequate Speech Info: Adequate    SPECIAL CARE  FACTORS FREQUENCY  PT (By licensed PT), OT (By licensed OT)     PT Frequency: 5x weekly OT Frequency: 5x weekly            Contractures Contractures Info: Not present    Additional Factors Info  Code Status, Allergies Code Status Info: Full Code Allergies Info: Trazodone, Gabapentin, Lyrica (Pregabalin),  Latex, and Lidocaine   Insulin Sliding Scale Info: See Medication List on AVS       Current Medications (03/22/2022):  This is the current hospital active medication list Current Facility-Administered Medications  Medication Dose Route Frequency Provider Last Rate Last Admin   acetaminophen (TYLENOL) tablet 650 mg  650 mg Oral Q6H PRN Donzetta Matters, MD   650 mg at 03/21/22 0750   alteplase (CATHFLO ACTIVASE) injection 2 mg  2 mg Intracatheter Once PRN Reesa Chew, MD       atorvastatin (LIPITOR) tablet 40 mg  40 mg Oral q1800 Donzetta Matters, MD   40 mg at 03/21/22 1819   carvedilol (COREG) tablet 6.25 mg  6.25 mg Oral BID Donzetta Matters, MD   6.25 mg at 03/22/22 5993   Chlorhexidine Gluconate Cloth 2 % PADS 6 each  6 each Topical Q0600 Reesa Chew, MD       DULoxetine (CYMBALTA) DR capsule 60 mg  60 mg Oral Daily Donzetta Matters, MD   60 mg at 03/22/22 0853   furosemide (LASIX) tablet 80 mg  80 mg Oral Daily Donzetta Matters, MD   80 mg at 03/22/22 0854   heparin injection 1,000 Units  1,000 Units Dialysis PRN Reesa Chew, MD   4,000 Units at 03/21/22 5701   hydrALAZINE (APRESOLINE) tablet 50 mg  50 mg Oral TID Donzetta Matters, MD   50 mg at 03/22/22 0855   insulin aspart (novoLOG) injection 0-9 Units  0-9 Units Subcutaneous TID WC Curatolo, Adam, DO   5 Units at 03/21/22 1647   insulin aspart (novoLOG) injection 6 Units  6 Units Subcutaneous TID WC Donzetta Matters, MD   6 Units at 03/21/22 1647   insulin glargine-yfgn (SEMGLEE) injection 10 Units  10 Units Subcutaneous QHS Donzetta Matters, MD   10 Units at 03/21/22 2156   lidocaine-prilocaine (EMLA) cream 1 Application  1 Application Topical PRN Reesa Chew, MD       methocarbamol (ROBAXIN) tablet 500 mg  500 mg Oral QID Donzetta Matters, MD   500 mg at 03/22/22 7793   oxyCODONE (Oxy IR/ROXICODONE) immediate release tablet 5 mg  5 mg Oral Q8H PRN Donzetta Matters, MD   5 mg at 03/22/22 9030    pentafluoroprop-tetrafluoroeth (GEBAUERS) aerosol 1 Application  1 Application Topical PRN Reesa Chew, MD       promethazine (PHENERGAN) tablet 12.5 mg  12.5 mg Oral BID PRN Donzetta Matters, MD       sevelamer carbonate (RENVELA) tablet 2,400 mg  2,400 mg Oral TID WC Donzetta Matters, MD   2,400 mg at 03/22/22 0854   topiramate (TOPAMAX) tablet 12.5 mg  12.5 mg Oral Daily Donzetta Matters, MD   12.5 mg at 03/21/22 1405   Current Outpatient Medications  Medication Sig Dispense Refill   acetaminophen (TYLENOL) 325 MG tablet Take 2 tablets (650 mg total) by mouth every 6 (six) hours as needed for mild pain (or Fever >/= 101).     alosetron (LOTRONEX) 1 MG tablet Take 1 tablet (1 mg total) by mouth 2 (two) times daily. 60 tablet 0   amitriptyline (ELAVIL) 25  MG tablet Take 2 tablets (50 mg total) by mouth at bedtime. 60 tablet 0   atorvastatin (LIPITOR) 40 MG tablet Take 1 tablet (40 mg total) by mouth daily at 6 PM. 30 tablet 1   carvedilol (COREG) 6.25 MG tablet Take 1 tablet (6.25 mg total) by mouth 2 (two) times daily. 60 tablet 0   dexlansoprazole (DEXILANT) 60 MG capsule Take 1 capsule (60 mg total) by mouth daily. 30 capsule 0   DULoxetine (CYMBALTA) 60 MG capsule Take 1 capsule (60 mg total) by mouth daily. 30 capsule 3   fentaNYL (DURAGESIC) 12 MCG/HR Place 1 patch onto the skin every 3 (three) days. 5 patch 0   furosemide (LASIX) 80 MG tablet Take 1 tablet (80 mg total) by mouth daily. 30 tablet 0   hydrALAZINE (APRESOLINE) 50 MG tablet Take 1 tablet (50 mg total) by mouth 3 (three) times daily. 90 tablet 2   insulin aspart (NOVOLOG) 100 UNIT/ML injection Inject 6 Units into the skin 3 (three) times daily with meals. 10 mL 11   LANTUS SOLOSTAR 100 UNIT/ML Solostar Pen Inject 10 Units into the skin at bedtime.     lidocaine-prilocaine (EMLA) cream Apply 1 Application topically daily as needed (for pain).     Menthol-Methyl Salicylate (MUSCLE RUB) 10-15 % CREA Apply 1 application  topically as needed for muscle pain.  0   methocarbamol (ROBAXIN) 500 MG tablet Take 1 tablet (500 mg total) by mouth 4 (four) times daily. 30 tablet 0   naloxone (NARCAN) nasal spray 4 mg/0.1 mL Place 1 spray into the nose as needed (accidental overdose). 30 g 0   oxyCODONE (OXY IR/ROXICODONE) 5 MG immediate release tablet Take 1 tablet (5 mg total) by mouth every 8 (eight) hours as needed for moderate pain. 21 tablet 0   promethazine (PHENERGAN) 12.5 MG tablet Take 1 tablet (12.5 mg total) by mouth 2 (two) times daily as needed for vomiting or nausea. 30 tablet 0   sevelamer carbonate (RENVELA) 800 MG tablet Take 3 tablets (2,400 mg total) by mouth 3 (three) times daily with meals. 60 tablet 0   topiramate (TOPAMAX) 25 MG tablet Take 1/2 tablet (12.5 mg total) by mouth daily. 30 tablet 1   docusate sodium (COLACE) 100 MG capsule Take 1 capsule (100 mg total) by mouth 2 (two) times daily. (Patient not taking: Reported on 08/26/2021)  0   nicotine (NICODERM CQ - DOSED IN MG/24 HOURS) 21 mg/24hr patch Place 1 patch (21 mg total) onto the skin daily as needed (nicotine craving). (Patient not taking: Reported on 03/20/2022) 28 patch 0     Discharge Medications: Please see After Visit Summary for a list of discharge medications.  Relevant Imaging Results:  Relevant Lab Results:   Additional Information SSN: [id number]. HD patient at [facility] on MWF. Pt arrives at 11:40 for 12:00 chair time.  Archie Endo, LCSW

## 2022-03-22 NOTE — ED Notes (Signed)
Tried calling report to facility no answer. Patient is going to room 206

## 2022-03-22 NOTE — ED Notes (Signed)
PTAR called no ETA pt to [facility]

## 2022-03-22 NOTE — ED Provider Notes (Addendum)
10:27 AM Notified by [facility] care team Florentina Addison, LCSW that patient was recommended for [facility] re[facility] with a bed available.    Patient complaining of chest pain and ruq pain. No record of chest pain workup or previous complaints. ECG and trop ordered.    ECG on 03/22/2021 demonstrates sinus thythm with rate of 68 bpm. No ST segment elevation or depression. Stable t waves. Initial troponin wnl. Second troponin pending. Stable electrolytes.  Plan for DC to [facility] rehab if second troponin stable and bed is still available.  Lianne Cure, DO 14/23/95 3202    Lianne Cure, DO 33/43/56 1820

## 2022-03-22 NOTE — ED Notes (Signed)
PTAR cancelled

## 2022-03-22 NOTE — Progress Notes (Addendum)
CSW spoke with Ebony Hail at [facility] who confirms patient can be accepted into the facility today.  The number to call for report is [phone]. Patient will transport via [facility] to call report and transport when ready. Patient will go to room 206.  Madilyn Fireman, MSW, LCSW Transitions of Care  Clinical Social Worker II [phone]

## 2022-03-23 NOTE — ED Notes (Signed)
Per verbal report from previous nurse, pt is due to transfer via PT[facility] to [facility] at [address] in [place] 9/38/1829, room #206. Facility will be ready to receive pt in the morning.

## 2022-03-23 NOTE — ED Notes (Signed)
RN attempted call x4

## 2022-03-23 NOTE — ED Notes (Signed)
PTAR called to transport patient back to [facility]

## 2022-03-23 NOTE — ED Notes (Signed)
RN attempted report x2 

## 2022-03-23 NOTE — ED Notes (Signed)
RN provided pt sprite and bedpan

## 2022-03-23 NOTE — ED Notes (Signed)
RN attempted to give report x1

## 2022-03-23 NOTE — ED Notes (Signed)
RN attempted x3 call disconnected No answer after calling back

## 2022-03-23 NOTE — ED Notes (Signed)
RN attempted to call report x5 but disconnected as soon as transferred

## 2022-03-24 NOTE — Progress Notes (Signed)
Late Note Entry:  Pt was d/c to snf in [place] this weekend. Contacted [facility] and spoke to [employer], Therapist, sports. Clinic is aware of pt's dc to snf and aware pt should resume today (Monday).   Melven Sartorius Renal Navigator [phone]

## 2022-04-29 ENCOUNTER — Ambulatory Visit: Payer: Medicare Other

## 2022-04-29 ENCOUNTER — Encounter (HOSPITAL_COMMUNITY): Payer: Self-pay

## 2022-04-29 ENCOUNTER — Ambulatory Visit (INDEPENDENT_AMBULATORY_CARE_PROVIDER_SITE_OTHER): Payer: Medicare Other | Admitting: Family

## 2022-04-29 DIAGNOSIS — M25552 Pain in left hip: Secondary | ICD-10-CM

## 2022-04-29 NOTE — Progress Notes (Signed)
Office Visit Note   Patient: [name]           Date of Birth: 1965-12-08           MRN: [id number] Visit Date: 04/29/2022              Requested by: [name], [place],  [place] [address] PCP: [name], MD  Chief Complaint  Patient presents with   Left Hip - Pain    08/10/2021 left hip removal nail 08/28/2021 left hip debridement 08/31/2021 repeat debridement  CT left hip 11/02/2021 on PACS      HPI: The patient is a 57 year old woman who presents today complaining of left hip loss of range of motion.  She is status post below-knee amputation on the left side.  She last had debridement with hardware removal of left hip removal of her IM nail June of last year.  She resides at skilled nursing in [place].  She is been working with physical therapy to be able to use her prosthesis on the left and resume weightbearing having difficulty with range of motion of both the knee and the hip  Concern for an issue with the hip after removal of hardware.  No significant hip pain or recent injury  Assessment & Plan: Visit Diagnoses:  1. Pain of left hip     Plan: Discussed surgical options.  Have discussed case with Dr. [name] as well.  Patient would like to proceed with conservative measures may proceed with physical therapy up with PT as tolerated.  May follow-up as needed  Follow-Up Instructions: No follow-ups on file.   Ortho Exam  Patient is alert, oriented, no adenopathy, well-dressed, normal affect, normal respiratory effort. On examination of the left hip there is no edema erythema no skin breakdown well-healed surgical scars lateral hip and thigh.  Significant stiffness loss of range of motion  Imaging: No results found. No images are attached to the encounter.  Labs: Lab Results  Component Value Date   HGBA1C 6.2 (H) 02/23/2022   HGBA1C 5.9 (H) 11/14/2021   HGBA1C 7.1 (H) 07/22/2021   ESRSEDRATE 21 11/13/2021   ESRSEDRATE 36 (H) 11/02/2021    ESRSEDRATE 86 (H) 08/02/2021   CRP 0.6 11/02/2021   CRP 4.9 (H) 08/02/2021   CRP 25.8 (H) 03/09/2020   REPTSTATUS 09/02/2021 FINAL 08/28/2021   GRAMSTAIN  08/28/2021    RARE WBC PRESENT,BOTH PMN AND MONONUCLEAR NO ORGANISMS SEEN    CULT  08/28/2021    MODERATE ENTEROCOCCUS FAECIUM VANCOMYCIN RESISTANT ENTEROCOCCUS ISOLATED NO ANAEROBES ISOLATED Sent to [facility] for further susceptibility testing. Performed at [facility], [place] [address], [place], [place] [address]    LABORGA ENTEROCOCCUS FAECIUM 08/28/2021     Lab Results  Component Value Date   ALBUMIN 3.7 03/22/2022   ALBUMIN 3.8 03/21/2022   ALBUMIN 3.5 03/17/2022    Lab Results  Component Value Date   MG 2.1 03/20/2022   MG 1.9 02/10/2022   MG 1.6 (L) 02/06/2022   Lab Results  Component Value Date   VD25OH 10.62 (L) 02/21/2022   VD25OH 21 (L) 05/08/2014    No results found for: "PREALBUMIN"    Latest Ref Rng & Units 03/22/2022    1:06 PM 03/21/2022    7:37 AM 03/20/2022    7:09 PM  CBC EXTENDED  WBC 4.0 - 10.5 K/uL 8.2  8.9  7.7   RBC 3.87 - 5.11 MIL/uL 3.94  3.82  3.90  Hemoglobin 12.0 - 15.0 g/dL 11.5  11.1  11.5   HCT 36.0 - 46.0 % 35.0  34.4  34.1   Platelets 150 - 400 K/uL 228  254  239   NEUT# 1.7 - 7.7 K/uL 4.9   4.5   Lymph# 0.7 - 4.0 K/uL 1.8   2.0      There is no height or weight on file to calculate BMI.  Orders:  Orders Placed This Encounter  Procedures   XR HIP UNILAT W OR W/O PELVIS 2-3 VIEWS LEFT   No orders of the defined types were placed in this encounter.    Procedures: No procedures performed  Clinical Data: No additional findings.  ROS:  All other systems negative, except as noted in the HPI. Review of Systems  Objective: Vital Signs: There were no vitals taken for this visit.  Specialty Comments:  No specialty comments available.  PMFS History: Patient Active Problem List   Diagnosis Date Noted   Wound dehiscence, surgical, sequela    Abscess of left  hip 08/26/2021   History of CVA (cerebrovascular accident) 123XX123   Hardware complicating wound infection ([place])    Subacute osteomyelitis of left femur with abscess     Septic arthritis of hip ([place]) 07/30/2021   Skin abscess L hip 07/28/2021   HCAP (healthcare-associated pneumonia) 07/27/2021   Hyperkalemia 0000000   Metabolic acidemia 0000000   Closed fracture of left distal femur ([place]) 07/22/2021   Type 2 diabetes mellitus with hyperlipidemia ([place]) 07/22/2021   Infection of superficial incisional surgical site after procedure 03/09/2020   Abscess    Hypoglycemia    Essential hypertension    Sleep disturbance    ESRD (end stage renal disease) on dialysis ([place]) 01/24/2020   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[place]) 01/23/2020   Hyponatremia    Constipation    Chronic osteomyelitis involving left ankle and foot ([place])    Ulcer of left foot with necrosis of bone ([place])    Wound infection 12/28/2019   History of Chopart amputation of left foot ([place]) 12/25/2017   History of CVA (cerebrovascular accident) without residual deficits 07/04/2017   Cerebral thrombosis with cerebral infarction 04/08/2017   Right sided weakness 04/07/2017   Hyperhidrosis 09/01/2016   Migraine with aura and without status migrainosus, not intractable 07/28/2016   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[place]) 08/04/2014   CKD stage 3 due to type 2 diabetes mellitus ([place]) 06/27/2014   Vitamin D insufficiency 05/08/2014   Obesity (BMI 30.0-34.9) 05/08/2014   Unilateral amputation of left foot ([place]) 05/08/2014   Bursitis of left shoulder 02/14/2014   Neck pain 01/03/2014   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.23) 01/14/2013   Insomnia 08/04/2012   Diabetic neuropathy, painful ([place]) 08/04/2011   Anxiety and depression 05/16/2010   Female stress incontinence 11/01/2007   TOBACCO DEPENDENCE 04/30/2006   GASTROESOPHAGEAL REFLUX, NO ESOPHAGITIS  04/30/2006   Irritable bowel syndrome 04/30/2006   Past Medical History:  Diagnosis Date   Anxiety 2002   Chest tightness    Chronic kidney disease    Depression 2001   Diabetes type 1, uncontrolled    at age 50   Diabetic neuropathy ([place])    Essential hypertension 2015   GERD (gastroesophageal reflux disease)    about age of 66   Headache    Nausea and vomiting in adult    Stroke [facility])    Urinary frequency    Yeast vaginitis  Family History  Problem Relation Age of Onset   Diabetes Mother    Heart attack Mother    Heart disease Mother        before age 34   Hypertension Mother    Hyperlipidemia Mother    Diabetes Father    Heart attack Father    Heart disease Father        before age 58   Diabetes Sister    Hypertension Sister     Past Surgical History:  Procedure Laterality Date   A/V FISTULAGRAM Left 08/22/2021   Procedure: A/V Fistulagram;  Surgeon: Cherre Robins, MD;  Location: [facility];  Service: Cardiovascular;  Laterality: Left;   ACHILLES TENDON SURGERY Left 03/10/2013   Procedure: LEFT CHOPART AMPUTATION/ LEFT TENDON ACHILLES [place];  Surgeon: Wylene Simmer, MD;  Location: [facility];  Service: Orthopedics;  Laterality: Left;   AMPUTATION Left 06/15/2012   Procedure: AMPUTATION DIGIT;  Surgeon: Meredith Pel, MD;  Location: [facility];  Service: Orthopedics;  Laterality: Left;  Left great toe revision amputation   AMPUTATION Left 01/12/2020   Procedure: AMPUTATION BELOW KNEE;  Surgeon: Wylene Simmer, MD;  Location: [facility];  Service: Orthopedics;  Laterality: Left;   APPLICATION OF WOUND VAC  08/28/2021   Procedure: APPLICATION OF WOUND VAC;  Surgeon: Newt Minion, MD;  Location: [facility];  Service: Orthopedics;;   AV FISTULA PLACEMENT Left 01/09/2020   Procedure: LEFT UPPER ARM ARTERIOVENOUS (AV) FISTULA CREATION;  Surgeon: Cherre Robins, MD;  Location: [facility];  Service: Vascular;  Laterality: Left;   ENDOMETRIAL ABLATION     HARDWARE REMOVAL Left  08/10/2021   Procedure: LEFT HIP REMOVAL NAIL;  Surgeon: Newt Minion, MD;  Location: [facility];  Service: Orthopedics;  Laterality: Left;   I & D EXTREMITY Left 01/19/2020   Procedure: IRRIGATION AND DEBRIDEMENT Left Hip;  Surgeon: Rod Can, MD;  Location: [facility];  Service: Orthopedics;  Laterality: Left;   I & D EXTREMITY Left 08/28/2021   Procedure: LEFT HIP DEBRIDEMENT;  Surgeon: Newt Minion, MD;  Location: [facility];  Service: Orthopedics;  Laterality: Left;   I & D EXTREMITY Left 08/31/2021   Procedure: REPEAT DEBRIDEMENT LEFT HIP;  Surgeon: Newt Minion, MD;  Location: [facility];  Service: Orthopedics;  Laterality: Left;   INSERTION OF DIALYSIS CATHETER Right 01/09/2020   Procedure: INSERTION OF DIALYSIS CATHETER;  Surgeon: Cherre Robins, MD;  Location: [facility];  Service: Vascular;  Laterality: Right;   INTRAMEDULLARY (IM) NAIL INTERTROCHANTERIC Left 12/29/2019   Procedure: INTRAMEDULLARY (IM) NAIL INTERTROCHANTRIC;  Surgeon: Rod Can, MD;  Location: [facility];  Service: Orthopedics;  Laterality: Left;   IR ANGIOGRAM EXTREMITY LEFT  12/17/2018   IR FEM POP ART ATHERECT INC PTA MOD SED  12/17/2018   IR RADIOLOGIST EVAL & MGMT  11/30/2018   IR RADIOLOGIST EVAL & MGMT  12/09/2018   IR RADIOLOGIST EVAL & MGMT  02/08/2019   IR US GUIDE VASC ACCESS RIGHT  12/17/2018   LAPAROSCOPIC CHOLECYSTECTOMY  01/30/2015   [facility]    REVISON OF ARTERIOVENOUS FISTULA Left 09/19/2021   Procedure: LIGATION OF LEFT ARM ARTERIOVENOUS FISTULA;  Surgeon: Broadus John, MD;  Location: [facility];  Service: Vascular;  Laterality: Left;   Social History   Occupational History    Employer: [employer]  Tobacco Use   Smoking status: Light Smoker    Packs/day: 0.50    Years: 22.00    Total pack years:  11.00    Types: Cigarettes   Smokeless tobacco: Never  Vaping Use   Vaping Use: Never used  Substance and Sexual Activity   Alcohol use: Yes    Comment: social   Drug use: No   Sexual activity: Yes     Partners: Male    Birth control/protection: Post-menopausal

## 2022-05-13 ENCOUNTER — Encounter: Payer: Self-pay | Admitting: Family

## 2022-05-20 ENCOUNTER — Telehealth: Payer: Self-pay | Admitting: Orthopedic Surgery

## 2022-05-20 NOTE — Telephone Encounter (Signed)
Pt called stating that the facility she is staying at is trying to discharge her. She state the facility is suppose to teach her to walk and she can barley stand and asking Dr [name] to stop the discharge or send new orders. The facility name is [facility]. Pt phone number is [phone].

## 2022-05-21 NOTE — Telephone Encounter (Signed)
I called pt and advised that Dr. [name] does not have any say so over d/c planning at the facility. This ups to physical therapy and her care team. Pt voiced understanding and will call with any other questions.
